# Patient Record
Sex: Female | Born: 1940 | Race: Black or African American | Hispanic: No | Marital: Married | Smoking: Former smoker
Health system: Southern US, Community
[De-identification: ages and names within clinical notes are randomized; demographics above are authoritative.]

## PROBLEM LIST (undated history)

## (undated) DIAGNOSIS — T783XXA Angioneurotic edema, initial encounter: Secondary | ICD-10-CM

## (undated) DIAGNOSIS — R410 Disorientation, unspecified: Secondary | ICD-10-CM

## (undated) DIAGNOSIS — F329 Major depressive disorder, single episode, unspecified: Secondary | ICD-10-CM

## (undated) DIAGNOSIS — N189 Chronic kidney disease, unspecified: Secondary | ICD-10-CM

## (undated) DIAGNOSIS — I517 Cardiomegaly: Secondary | ICD-10-CM

## (undated) DIAGNOSIS — M069 Rheumatoid arthritis, unspecified: Secondary | ICD-10-CM

## (undated) DIAGNOSIS — E559 Vitamin D deficiency, unspecified: Secondary | ICD-10-CM

## (undated) DIAGNOSIS — Z515 Encounter for palliative care: Secondary | ICD-10-CM

## (undated) DIAGNOSIS — E119 Type 2 diabetes mellitus without complications: Secondary | ICD-10-CM

## (undated) DIAGNOSIS — R569 Unspecified convulsions: Secondary | ICD-10-CM

## (undated) DIAGNOSIS — F32A Depression, unspecified: Secondary | ICD-10-CM

## (undated) DIAGNOSIS — M1990 Unspecified osteoarthritis, unspecified site: Secondary | ICD-10-CM

## (undated) DIAGNOSIS — F419 Anxiety disorder, unspecified: Secondary | ICD-10-CM

## (undated) DIAGNOSIS — K5909 Other constipation: Secondary | ICD-10-CM

## (undated) DIAGNOSIS — J449 Chronic obstructive pulmonary disease, unspecified: Secondary | ICD-10-CM

## (undated) DIAGNOSIS — I639 Cerebral infarction, unspecified: Secondary | ICD-10-CM

## (undated) DIAGNOSIS — K219 Gastro-esophageal reflux disease without esophagitis: Secondary | ICD-10-CM

## (undated) DIAGNOSIS — L281 Prurigo nodularis: Secondary | ICD-10-CM

## (undated) DIAGNOSIS — R4182 Altered mental status, unspecified: Secondary | ICD-10-CM

## (undated) DIAGNOSIS — I10 Essential (primary) hypertension: Secondary | ICD-10-CM

## (undated) DIAGNOSIS — K8020 Calculus of gallbladder without cholecystitis without obstruction: Secondary | ICD-10-CM

## (undated) DIAGNOSIS — Z720 Tobacco use: Secondary | ICD-10-CM

## (undated) DIAGNOSIS — I509 Heart failure, unspecified: Secondary | ICD-10-CM

## (undated) HISTORY — DX: Vitamin D deficiency, unspecified: E55.9

## (undated) HISTORY — DX: Unspecified convulsions: R56.9

## (undated) HISTORY — DX: Chronic kidney disease, unspecified: N18.9

## (undated) HISTORY — DX: Depression, unspecified: F32.A

## (undated) HISTORY — PX: CHOLECYSTECTOMY: SHX55

## (undated) HISTORY — DX: Gastro-esophageal reflux disease without esophagitis: K21.9

## (undated) HISTORY — DX: Major depressive disorder, single episode, unspecified: F32.9

## (undated) HISTORY — DX: Other constipation: K59.09

## (undated) HISTORY — DX: Cardiomegaly: I51.7

## (undated) HISTORY — DX: Calculus of gallbladder without cholecystitis without obstruction: K80.20

## (undated) HISTORY — DX: Anxiety disorder, unspecified: F41.9

## (undated) HISTORY — DX: Tobacco use: Z72.0

## (undated) HISTORY — DX: Angioneurotic edema, initial encounter: T78.3XXA

## (undated) HISTORY — DX: Cerebral infarction, unspecified: I63.9

## (undated) HISTORY — DX: Unspecified osteoarthritis, unspecified site: M19.90

## (undated) HISTORY — DX: Chronic obstructive pulmonary disease, unspecified: J44.9

## (undated) HISTORY — DX: Essential (primary) hypertension: I10

## (undated) HISTORY — DX: Prurigo nodularis: L28.1

---

## 2004-04-29 ENCOUNTER — Ambulatory Visit: Payer: Self-pay | Admitting: Family Medicine

## 2006-11-09 IMAGING — CT CT ABD-PELV W/ CM
1 of 2 series · 15 of 32 positions shown, 19 images · non-contrast
Comparison: none

REASON FOR EXAM: RUQ and RLQ , supra pubic pain
COMMENTS:

[Series 2: abdomen · axial · 0.70mm/px · z∈[-458,-90]mm · 15 of 51 slices shown, 19 images]
[im 3/51  soft-tissue]
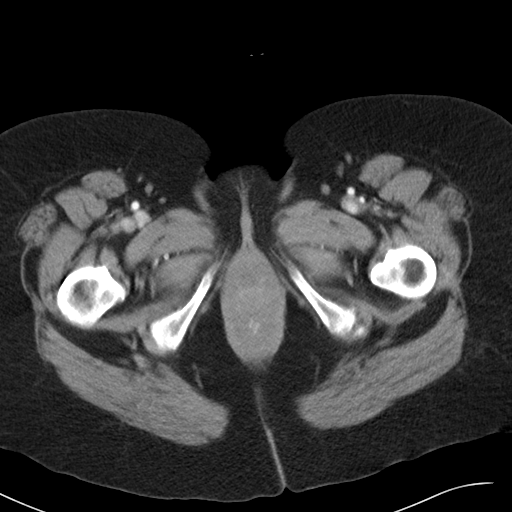
[im 3/51  bone]
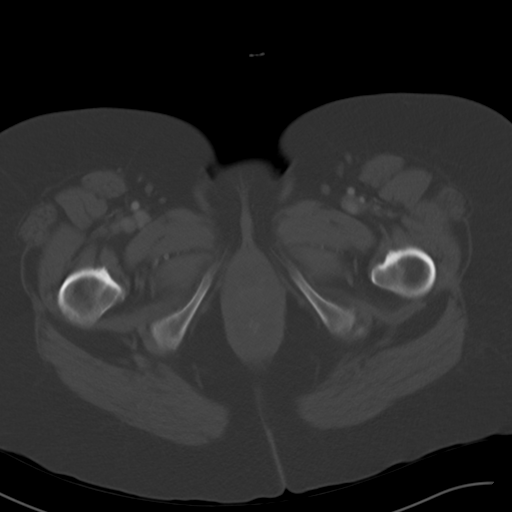
[im 7/51  soft-tissue]
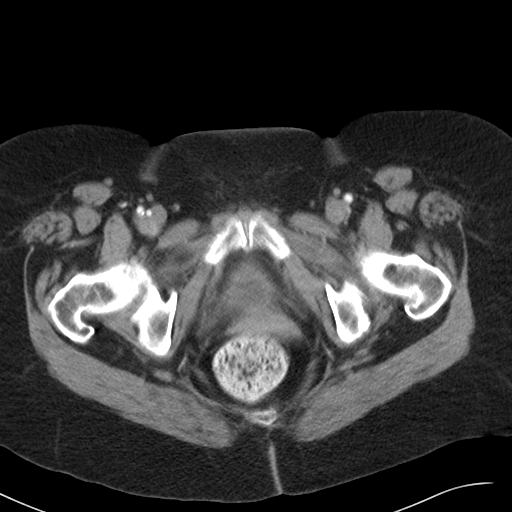
[im 11/51  soft-tissue]
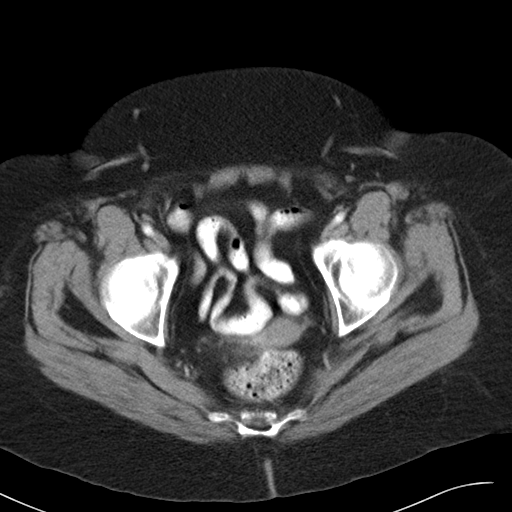
[im 15/51  soft-tissue]
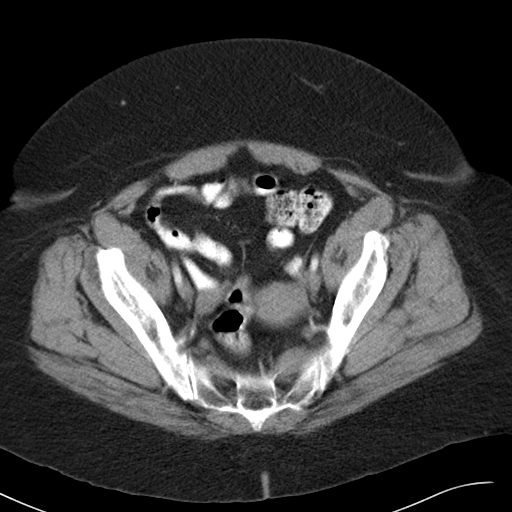
[im 19/51  soft-tissue]
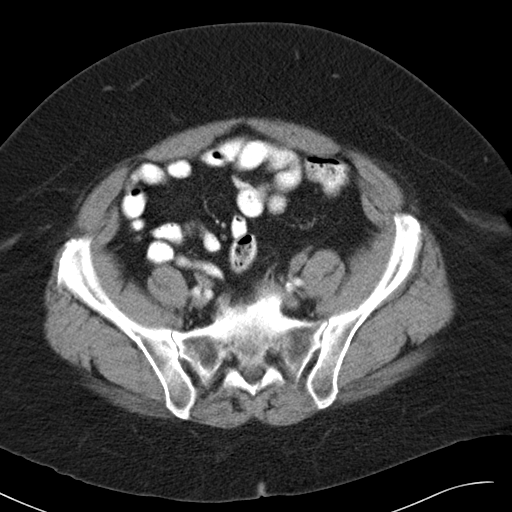
[im 23/51  soft-tissue]
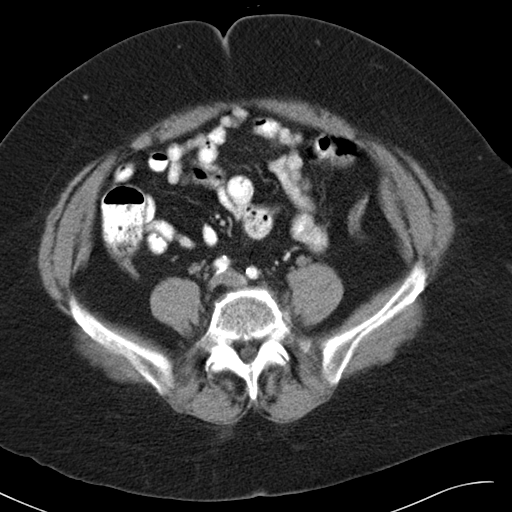
[im 27/51  soft-tissue]
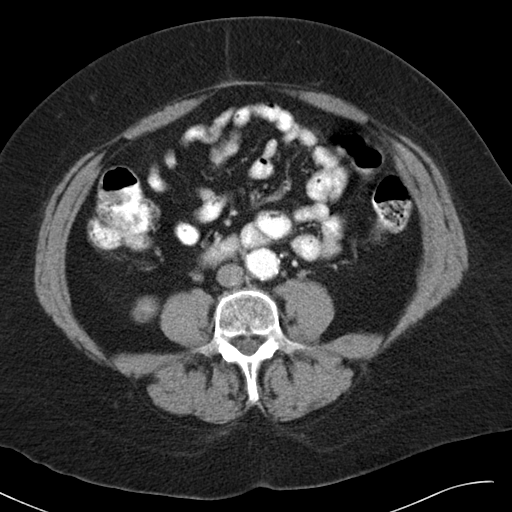
[im 29/51  soft-tissue]
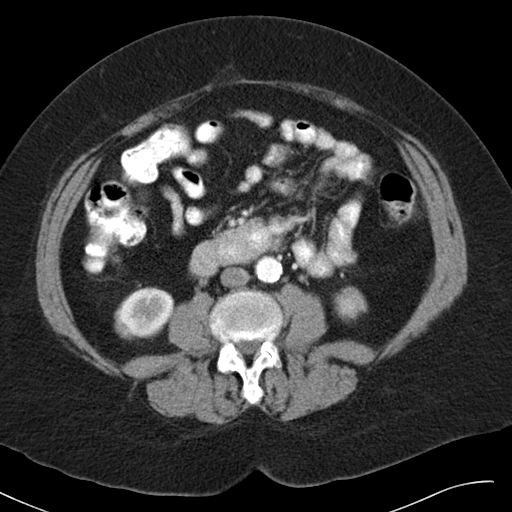
[im 33/51  soft-tissue]
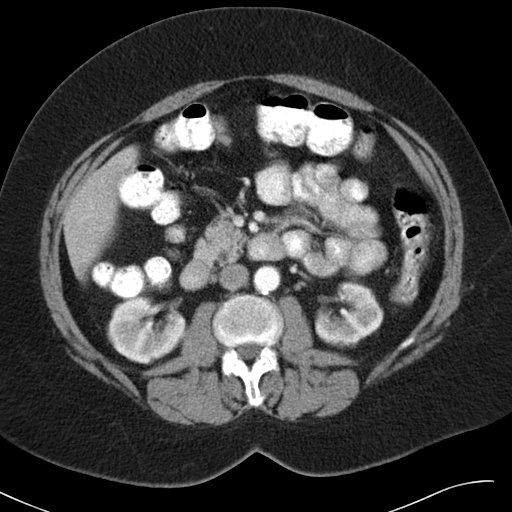
[im 33/51  bone]
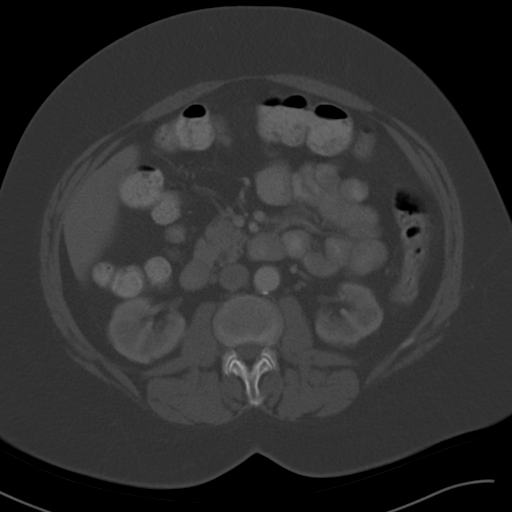
[im 37/51  soft-tissue]
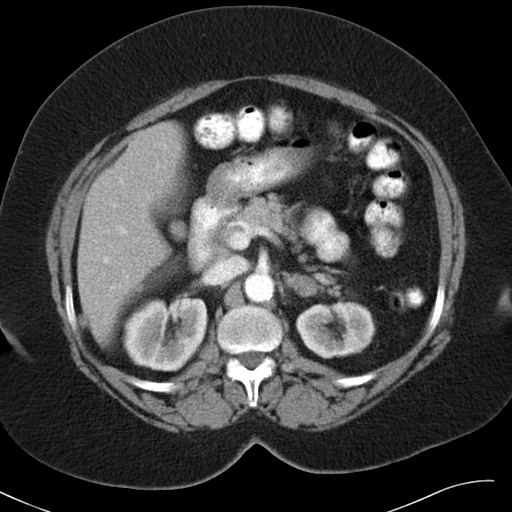
[im 41/51  soft-tissue]
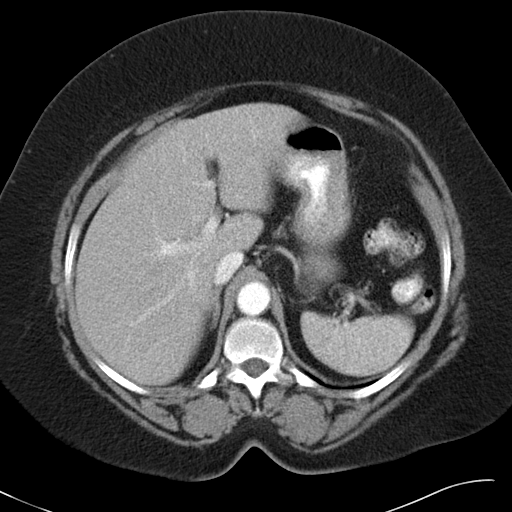
[im 43/51  lung]
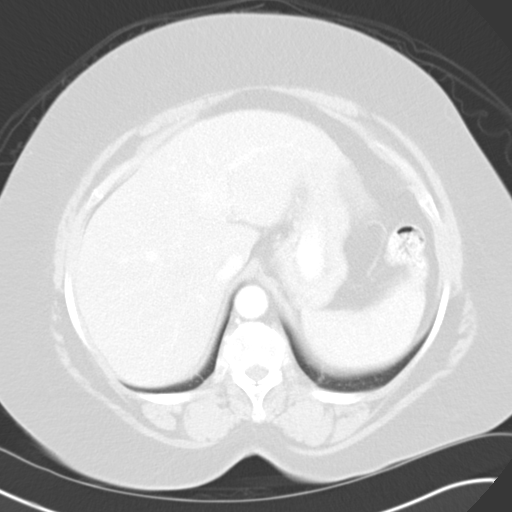
[im 45/51  soft-tissue]
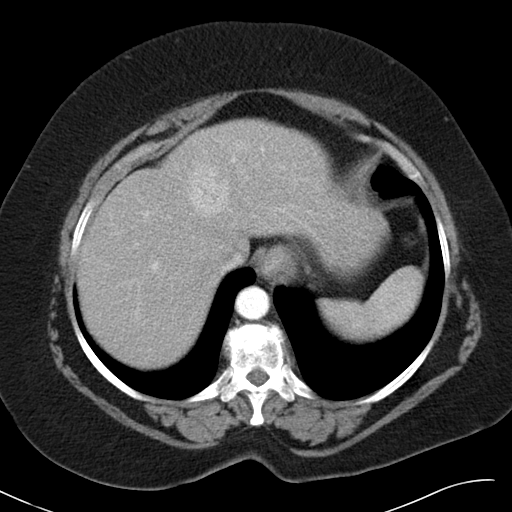
[im 45/51  lung]
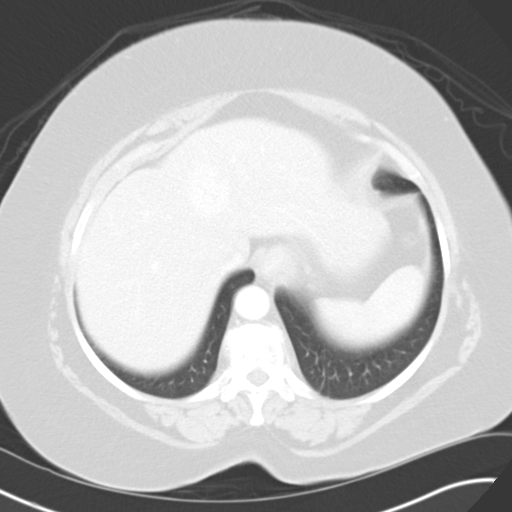
[im 47/51  lung]
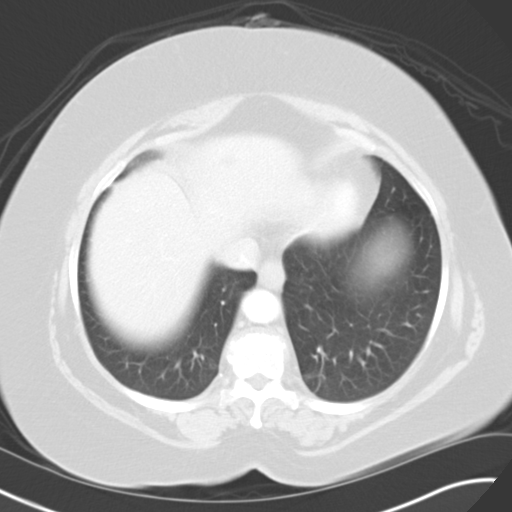
[im 49/51  soft-tissue]
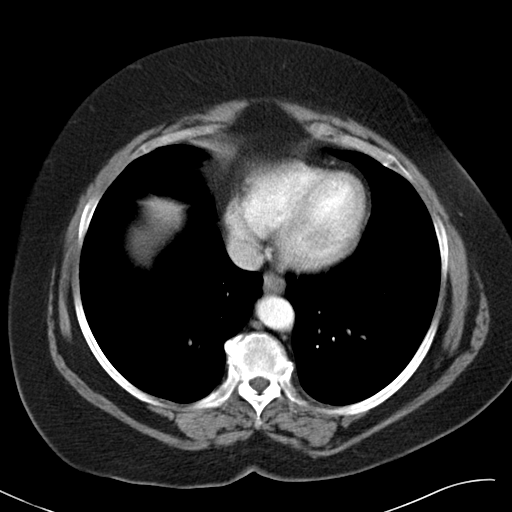
[im 49/51  lung]
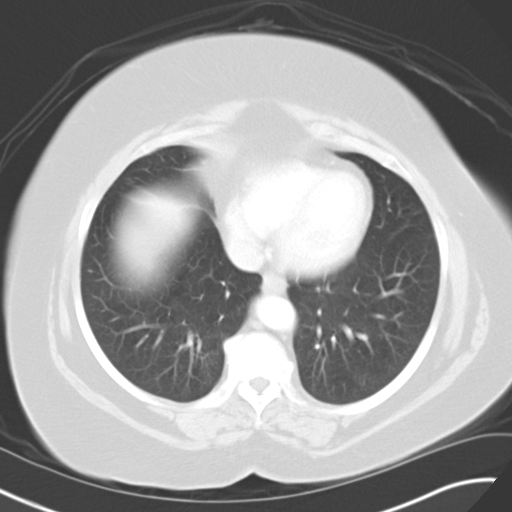

[15 of 32 positions shown; findings below may reference images not displayed]

PROCEDURE:     CT  - CT ABDOMEN / PELVIS  W  - April 29, 2004  [DATE]

RESULT:     8 mm helical cuts were performed through the abdomen and pelvis
with oral and 100 ccs of Isovue 370 contrast.  The lung bases are clear. The
liver, spleen, stomach, pancreas, adrenal glands and kidneys are
unremarkable. The gallbladder has been previously removed.  There is no free
intraperitoneal fluid, air or adenopathy. The bony structures show
degenerative change. Cuts through the pelvis show the bladder to distend
normally without evidence of filling defect or wall thickening. The patient
still has her uterus.  CT is not reliable for evaluating the endometrium. If
there is clinical concern ultrasound would be of benefit. There is no
diverticular disease, evidence of abscess or diverticulitis. No inguinal
mass or adenopathy is noted.
IMPRESSION: 1)No suspicious solid organ abnormalities, free intraperitoneal fluid, air
or adenopathy.

2)Extensive degenerative bony changes without lytic or blastic bony lesion.

3)Vascular calcifications without evidence of aneurysmal dilatation of the
aorta.

## 2007-12-24 ENCOUNTER — Ambulatory Visit: Payer: Self-pay | Admitting: Nephrology

## 2008-07-24 ENCOUNTER — Emergency Department: Payer: Self-pay | Admitting: Emergency Medicine

## 2010-04-30 ENCOUNTER — Emergency Department: Payer: Self-pay | Admitting: Unknown Physician Specialty

## 2010-07-05 IMAGING — US US RENAL KIDNEY
1 series · 17 of 20 positions shown · non-contrast
Comparison: none

REASON FOR EXAM: Chronic kidney disease - stage III
COMMENTS:

[Series 1: us renal kidney · 17 of 20 slices shown]
[im 1/20]
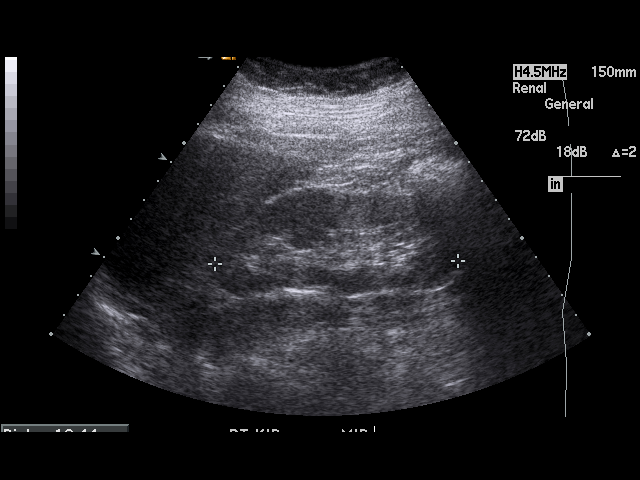
[im 2/20]
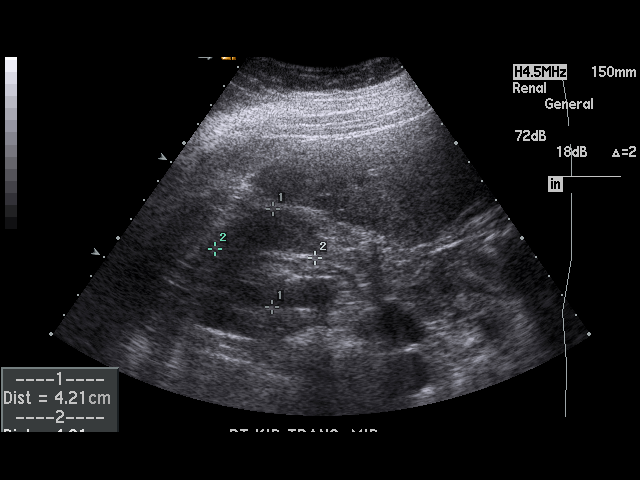
[im 3/20]
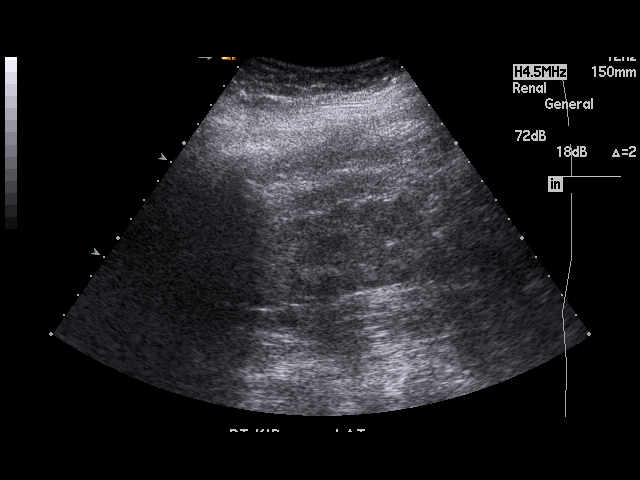
[im 5/20]
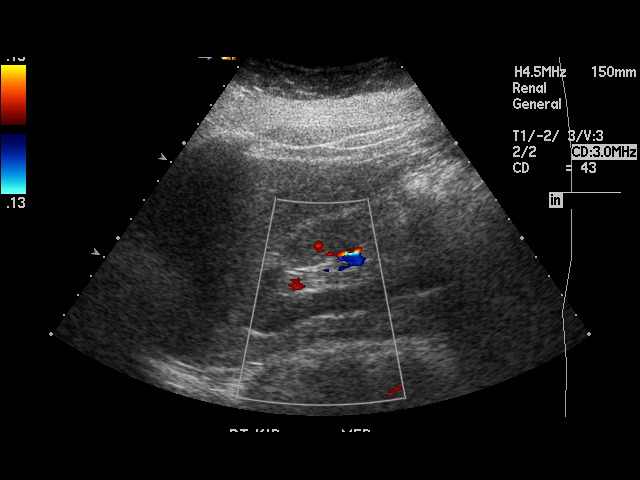
[im 6/20]
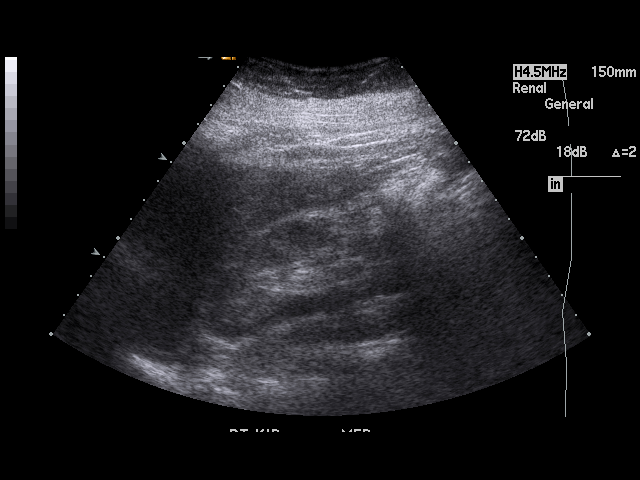
[im 7/20]
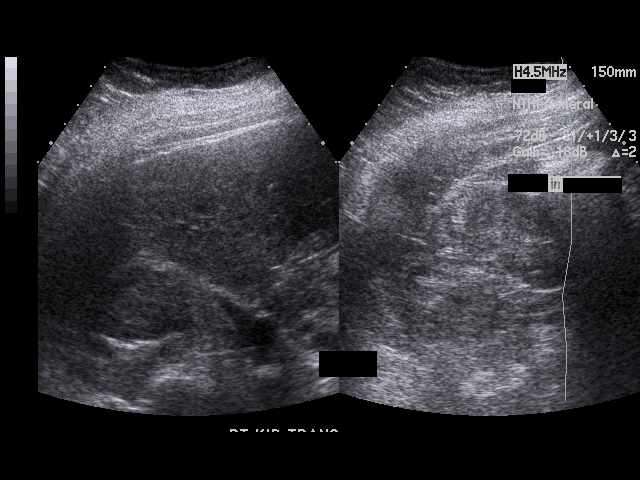
[im 8/20]
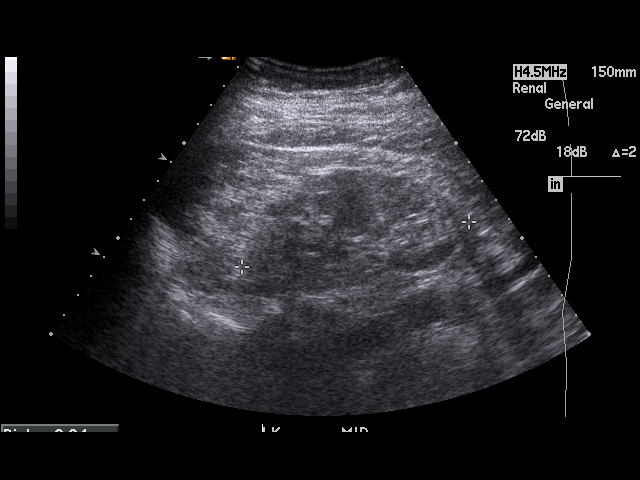
[im 9/20]
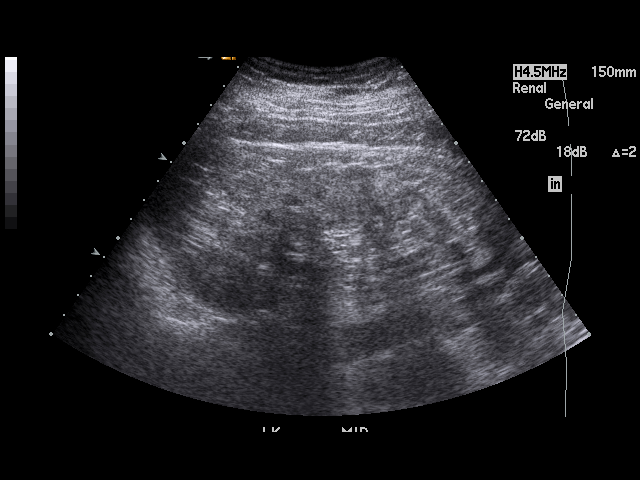
[im 11/20]
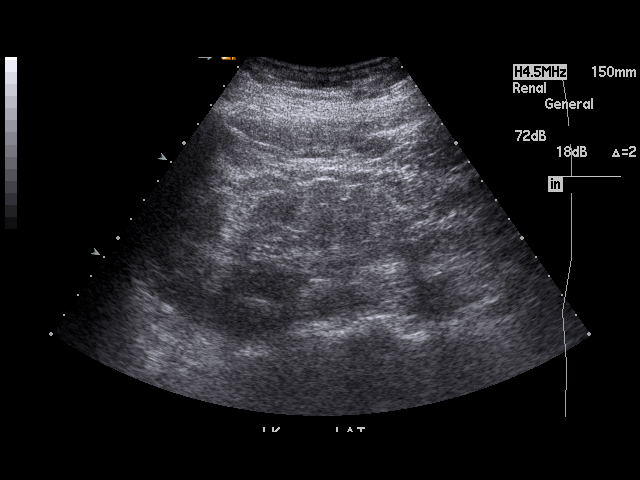
[im 12/20]
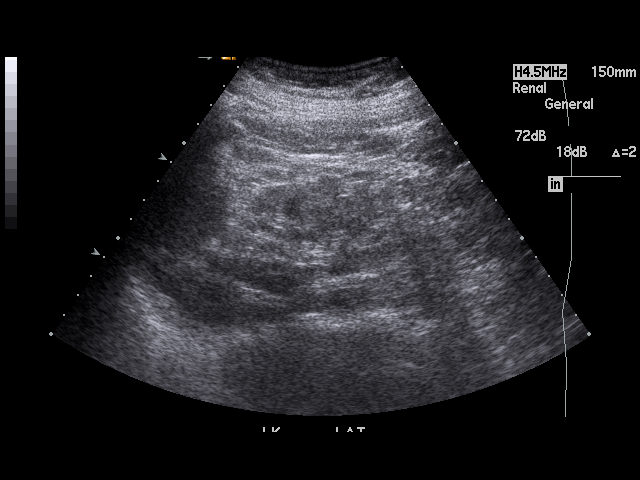
[im 13/20]
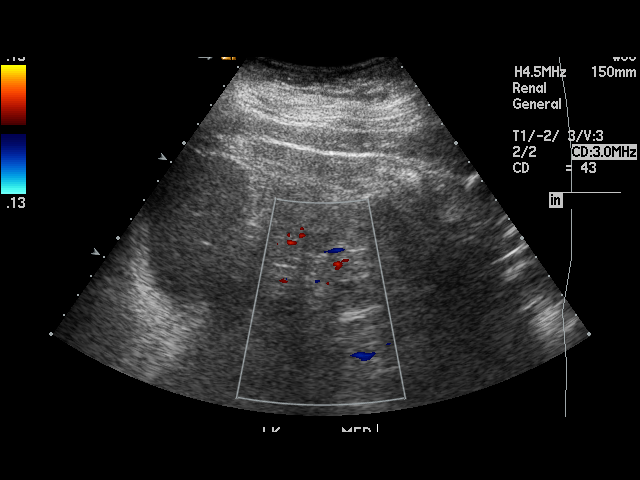
[im 14/20]
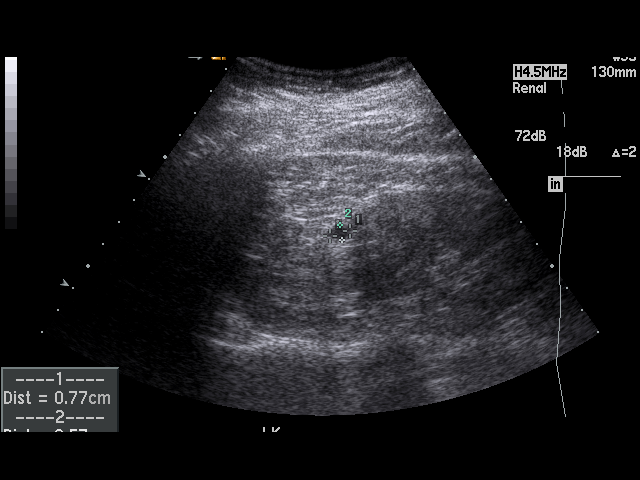
[im 15/20]
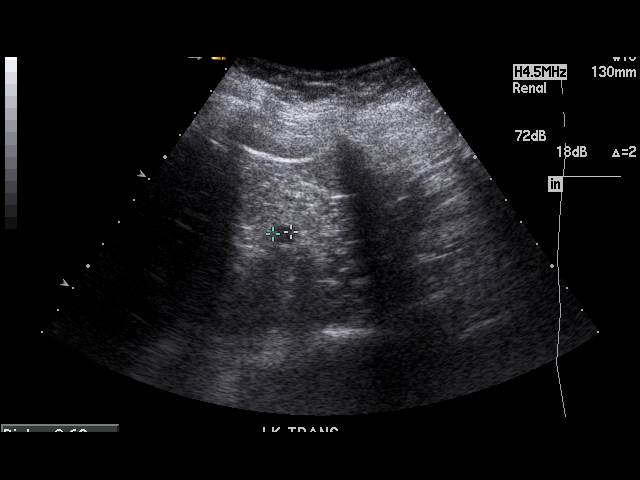
[im 16/20]
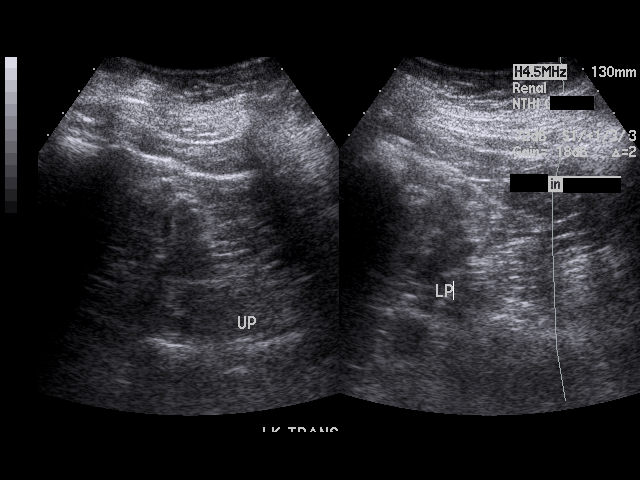
[im 18/20]
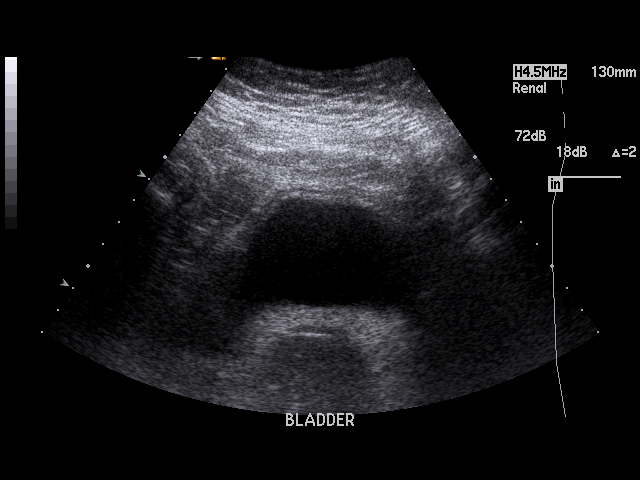
[im 19/20]
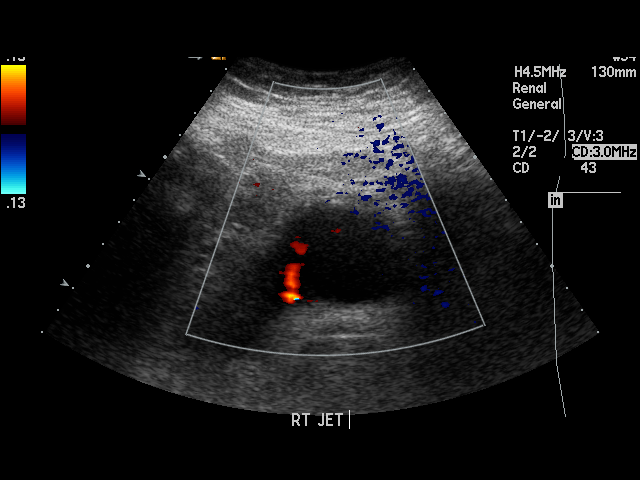
[im 20/20]
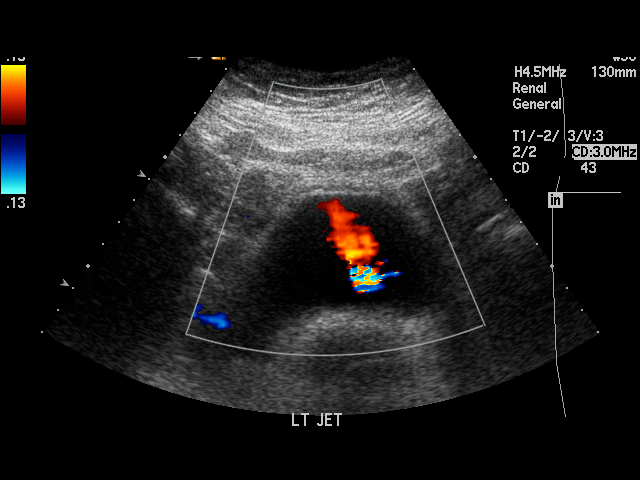

[17 of 20 positions shown; findings below may reference images not displayed]

PROCEDURE:     US  - US KIDNEY  - December 24, 2007  [DATE]

RESULT:     The RIGHT kidney measures 10.44 cm x 4.21 cm x 4.31 cm. The LEFT
kidney measures 9.94 cm x 4.27 cm x 3.84 cm. The renal cortical margins are
smooth. The kidneys show increased echogenicity compatible with the clinical
history of renal insufficiency. Incidentally noted is a 7.7 mm cyst of the
LEFT kidney. No solid renal mass lesions are seen. No renal calcifications
are noted. There is no hydronephrosis. The visualized portion of the urinary
bladder is normal in appearance. Ureteral flow jets are observed bilaterally.
IMPRESSION: 1.  No acute changes are identified.
2.  No hydronephrosis, renal calcifications or solid renal mass lesions are
identified.
3.  Incidental note is made of a tiny, LEFT renal cyst.
4.  The kidneys show increased echogenicity consistent with the clinical
history of renal insufficiency.

## 2011-02-04 IMAGING — CT CT HEAD WITHOUT CONTRAST
2 series · 16 of 30 positions shown, 20 images · non-contrast
Comparison: none

REASON FOR EXAM: DIZZY, VOMITING
COMMENTS:

[Series 2: without · axial · non-contrast · 0.40mm/px · z∈[+308,+422]mm · 13 of 27 slices shown, 17 images]
[im 2/27  brain]
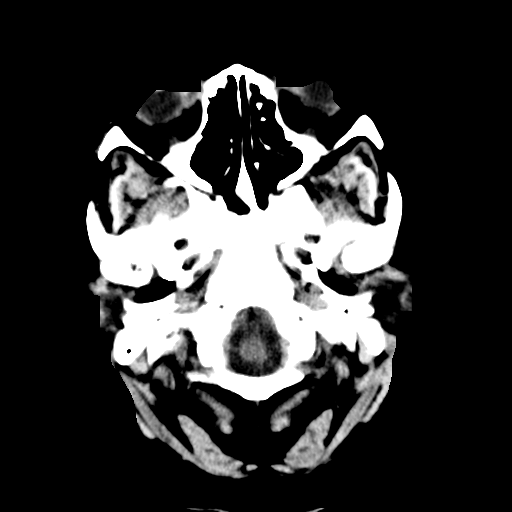
[im 2/27  bone]
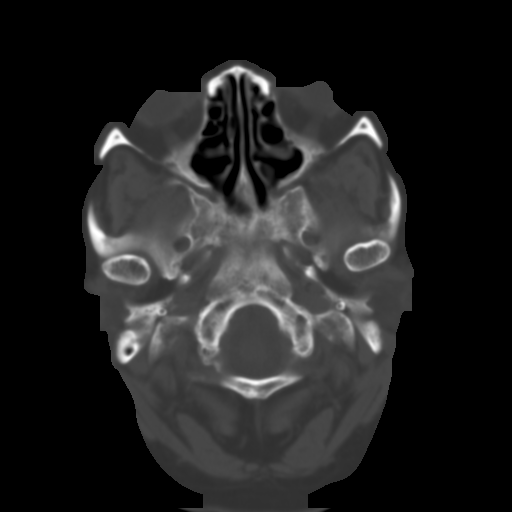
[im 4/27  brain]
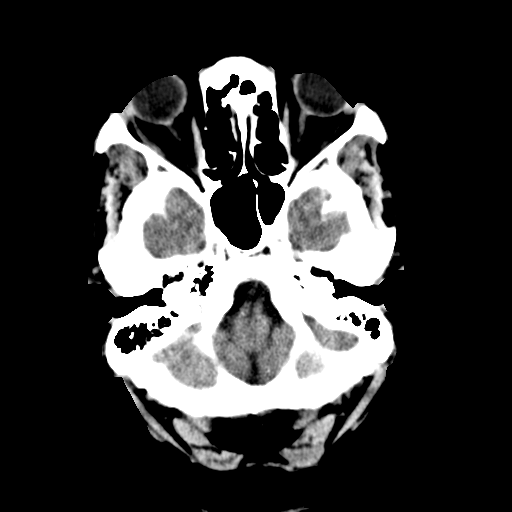
[im 6/27  brain]
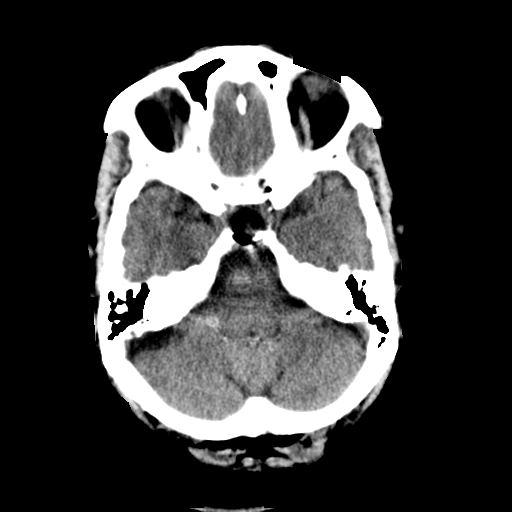
[im 8/27  brain]
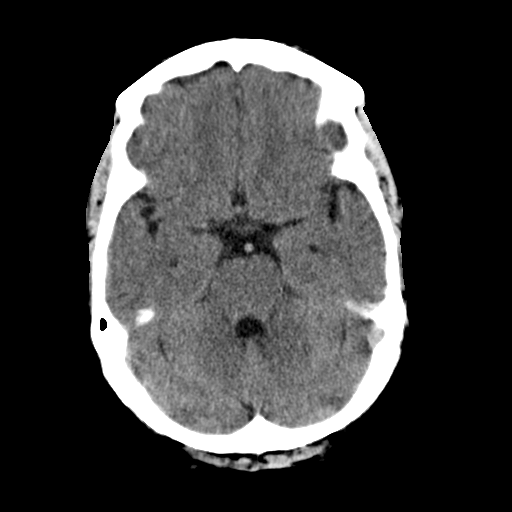
[im 10/27  brain]
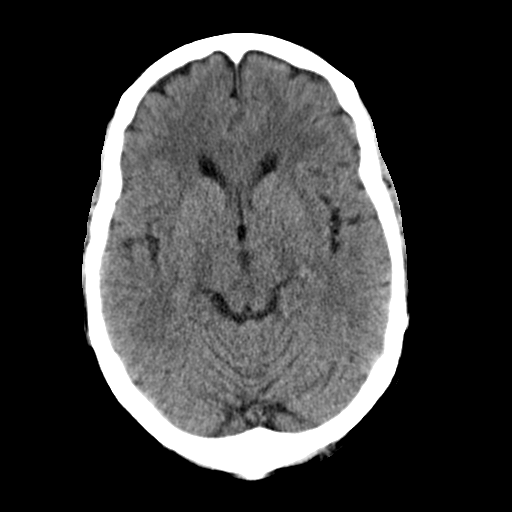
[im 10/27  bone]
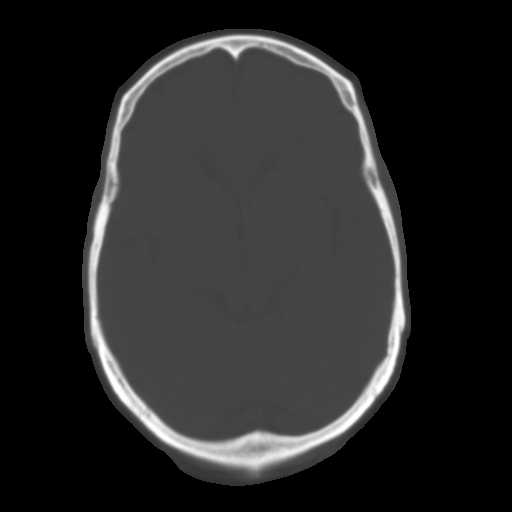
[im 12/27  brain]
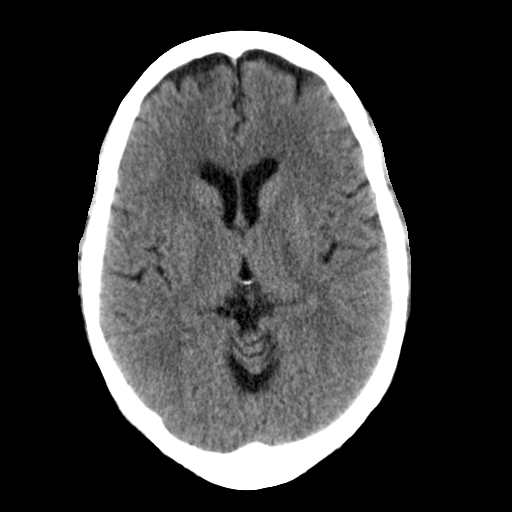
[im 14/27  brain]
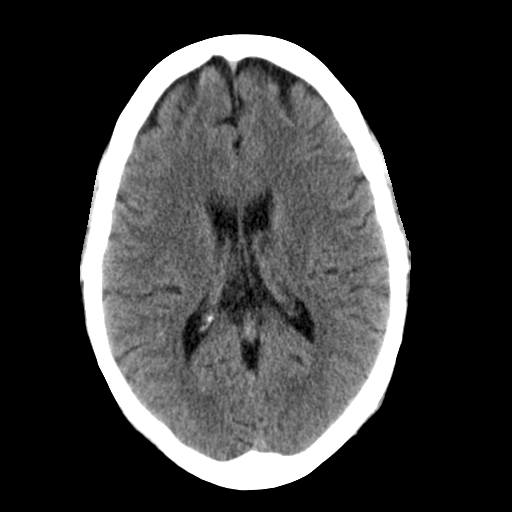
[im 15/27  brain]
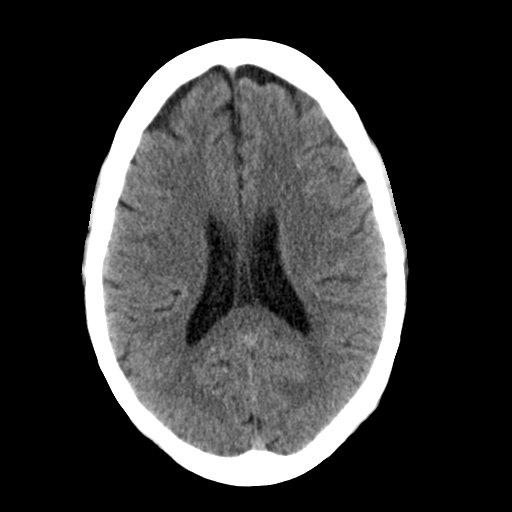
[im 17/27  brain]
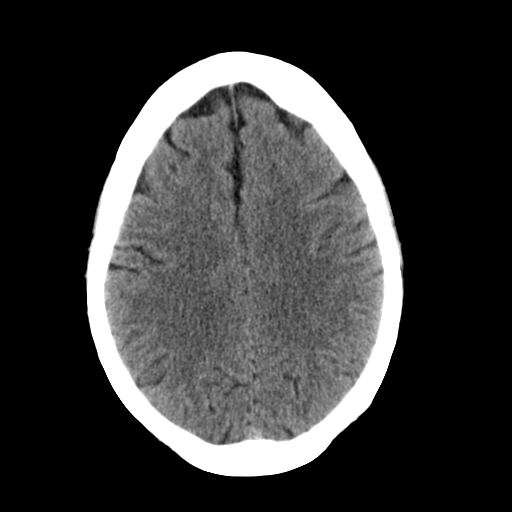
[im 17/27  bone]
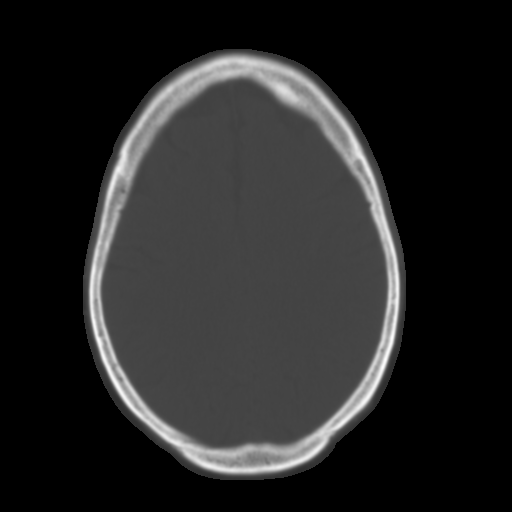
[im 19/27  brain]
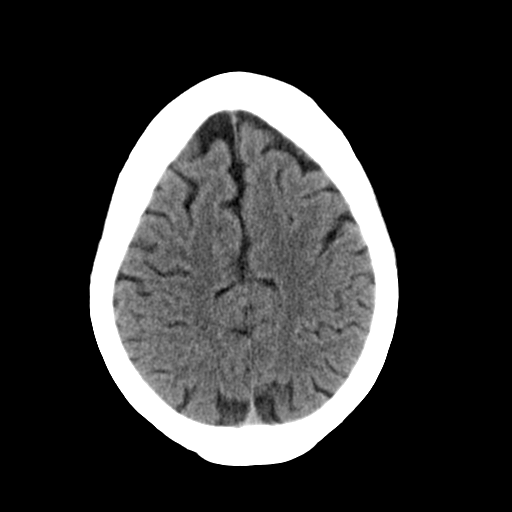
[im 21/27  brain]
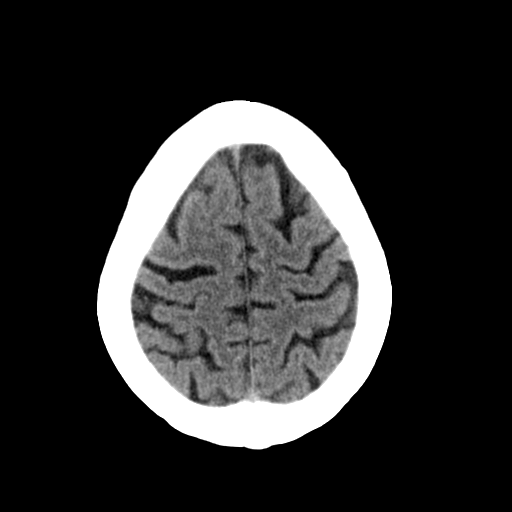
[im 23/27  brain]
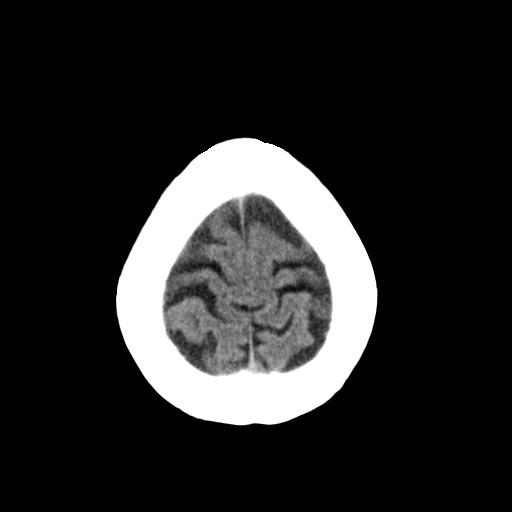
[im 25/27  brain]
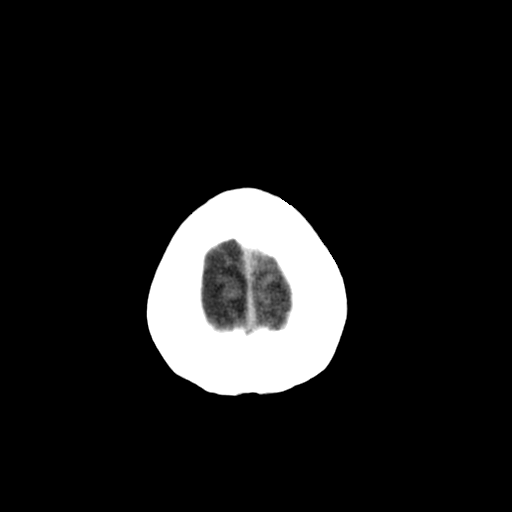
[im 25/27  bone]
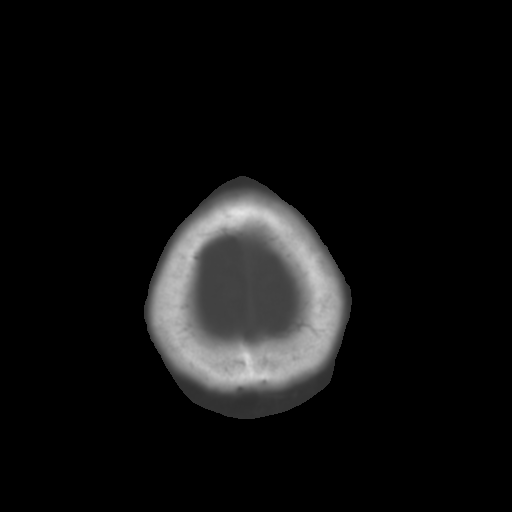

[Series 3: bone · axial · 0.40mm/px · z∈[+308,+348]mm · 3 of 27 slices shown]
[im 2/27  bone]
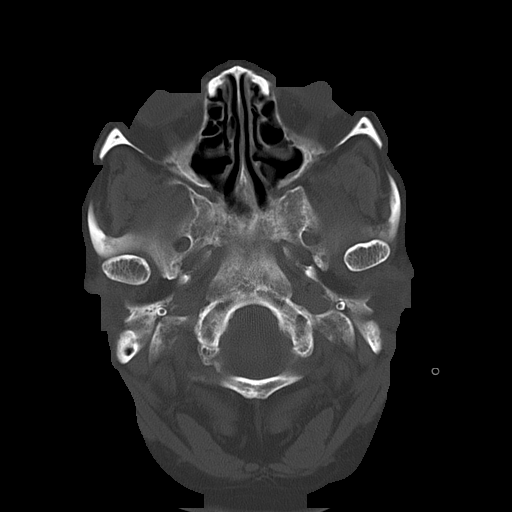
[im 6/27  bone]
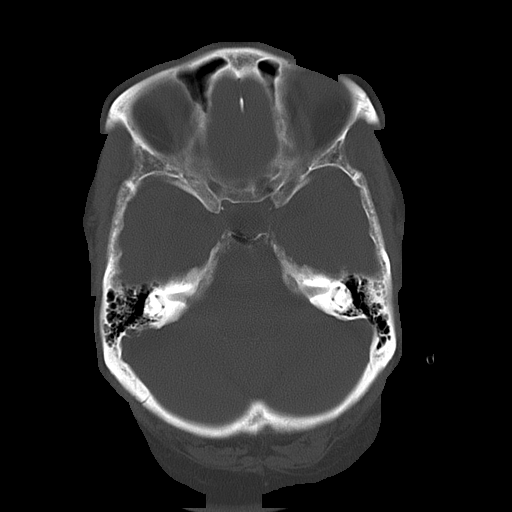
[im 10/27  bone]
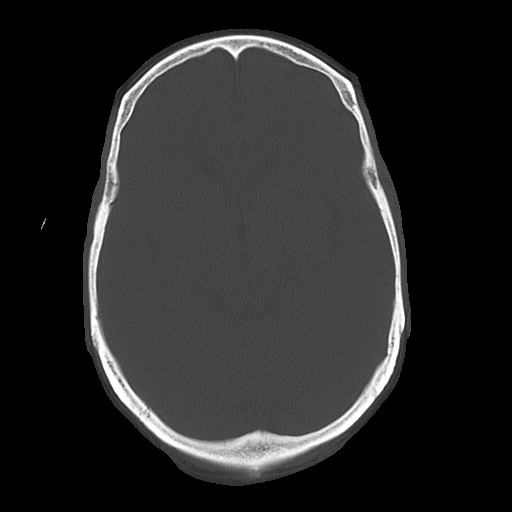

[16 of 30 positions shown; findings below may reference images not displayed]

PROCEDURE:     CT  - CT HEAD WITHOUT CONTRAST  - July 25, 2008 [DATE]

RESULT:     Axial noncontrast CT scanning was performed through the brain at
5 mm intervals and slice thicknesses.

The ventricles are normal in size and position. There is no evidence of an
intracranial hemorrhage nor of intracranial mass effect. There are no
findings to suggest an evolving ischemic infarction. The cerebellum and
brainstem are normal in appearance. At bone window settings the observed
portions of the paranasal sinuses and mastoid air cells are clear. There is
no evidence of an acute skull fracture.
IMPRESSION: I see no acute intracranial abnormality.

A preliminary report was sent to the [HOSPITAL] the conclusion
of the study.

## 2011-04-01 ENCOUNTER — Emergency Department: Payer: Self-pay | Admitting: Emergency Medicine

## 2011-04-01 LAB — COMPREHENSIVE METABOLIC PANEL
Anion Gap: 11 (ref 7–16)
BUN: 24 mg/dL — ABNORMAL HIGH (ref 7–18)
Bilirubin,Total: 0.3 mg/dL (ref 0.2–1.0)
Calcium, Total: 10 mg/dL (ref 8.5–10.1)
Chloride: 107 mmol/L (ref 98–107)
Creatinine: 1.53 mg/dL — ABNORMAL HIGH (ref 0.60–1.30)
EGFR (African American): 43 — ABNORMAL LOW
Potassium: 4.4 mmol/L (ref 3.5–5.1)
SGOT(AST): 22 U/L (ref 15–37)
Total Protein: 8.2 g/dL (ref 6.4–8.2)

## 2011-04-01 LAB — CBC
HCT: 43.7 % (ref 35.0–47.0)
MCH: 32.3 pg (ref 26.0–34.0)
MCHC: 33.7 g/dL (ref 32.0–36.0)
MCV: 96 fL (ref 80–100)
Platelet: 235 10*3/uL (ref 150–440)
RDW: 14.6 % — ABNORMAL HIGH (ref 11.5–14.5)
WBC: 5 10*3/uL (ref 3.6–11.0)

## 2011-04-01 LAB — URINALYSIS, COMPLETE
Glucose,UR: NEGATIVE mg/dL (ref 0–75)
Ketone: NEGATIVE
Ph: 5 (ref 4.5–8.0)
Protein: NEGATIVE
RBC,UR: 1 /HPF (ref 0–5)
Specific Gravity: 1.009 (ref 1.003–1.030)
Squamous Epithelial: 3
WBC UR: 3 /HPF (ref 0–5)

## 2011-04-01 LAB — LIPASE, BLOOD: Lipase: 187 U/L (ref 73–393)

## 2011-11-04 ENCOUNTER — Emergency Department: Payer: Self-pay | Admitting: Unknown Physician Specialty

## 2011-11-04 LAB — CBC
Platelet: 198 10*3/uL (ref 150–440)
RBC: 4.51 10*6/uL (ref 3.80–5.20)
RDW: 13.9 % (ref 11.5–14.5)

## 2011-11-04 LAB — HEPATIC FUNCTION PANEL A (ARMC)
Albumin: 3.6 g/dL (ref 3.4–5.0)
SGOT(AST): 19 U/L (ref 15–37)
SGPT (ALT): 13 U/L (ref 12–78)
Total Protein: 7.5 g/dL (ref 6.4–8.2)

## 2011-11-04 LAB — BASIC METABOLIC PANEL
Anion Gap: 10 (ref 7–16)
Co2: 26 mmol/L (ref 21–32)
Creatinine: 1.31 mg/dL — ABNORMAL HIGH (ref 0.60–1.30)
Glucose: 100 mg/dL — ABNORMAL HIGH (ref 65–99)
Sodium: 144 mmol/L (ref 136–145)

## 2011-11-04 LAB — PRO B NATRIURETIC PEPTIDE: B-Type Natriuretic Peptide: 142 pg/mL — ABNORMAL HIGH (ref 0–125)

## 2011-11-14 HISTORY — PX: POLYPECTOMY: SHX149

## 2011-11-22 ENCOUNTER — Ambulatory Visit: Payer: Self-pay | Admitting: Family Medicine

## 2011-12-06 ENCOUNTER — Ambulatory Visit: Payer: Self-pay | Admitting: Otolaryngology

## 2012-11-09 IMAGING — CR DG CHEST 1V
1 series · 1 of 1 positions shown · non-contrast
Comparison: none

REASON FOR EXAM: cp
COMMENTS:

[view not recorded]
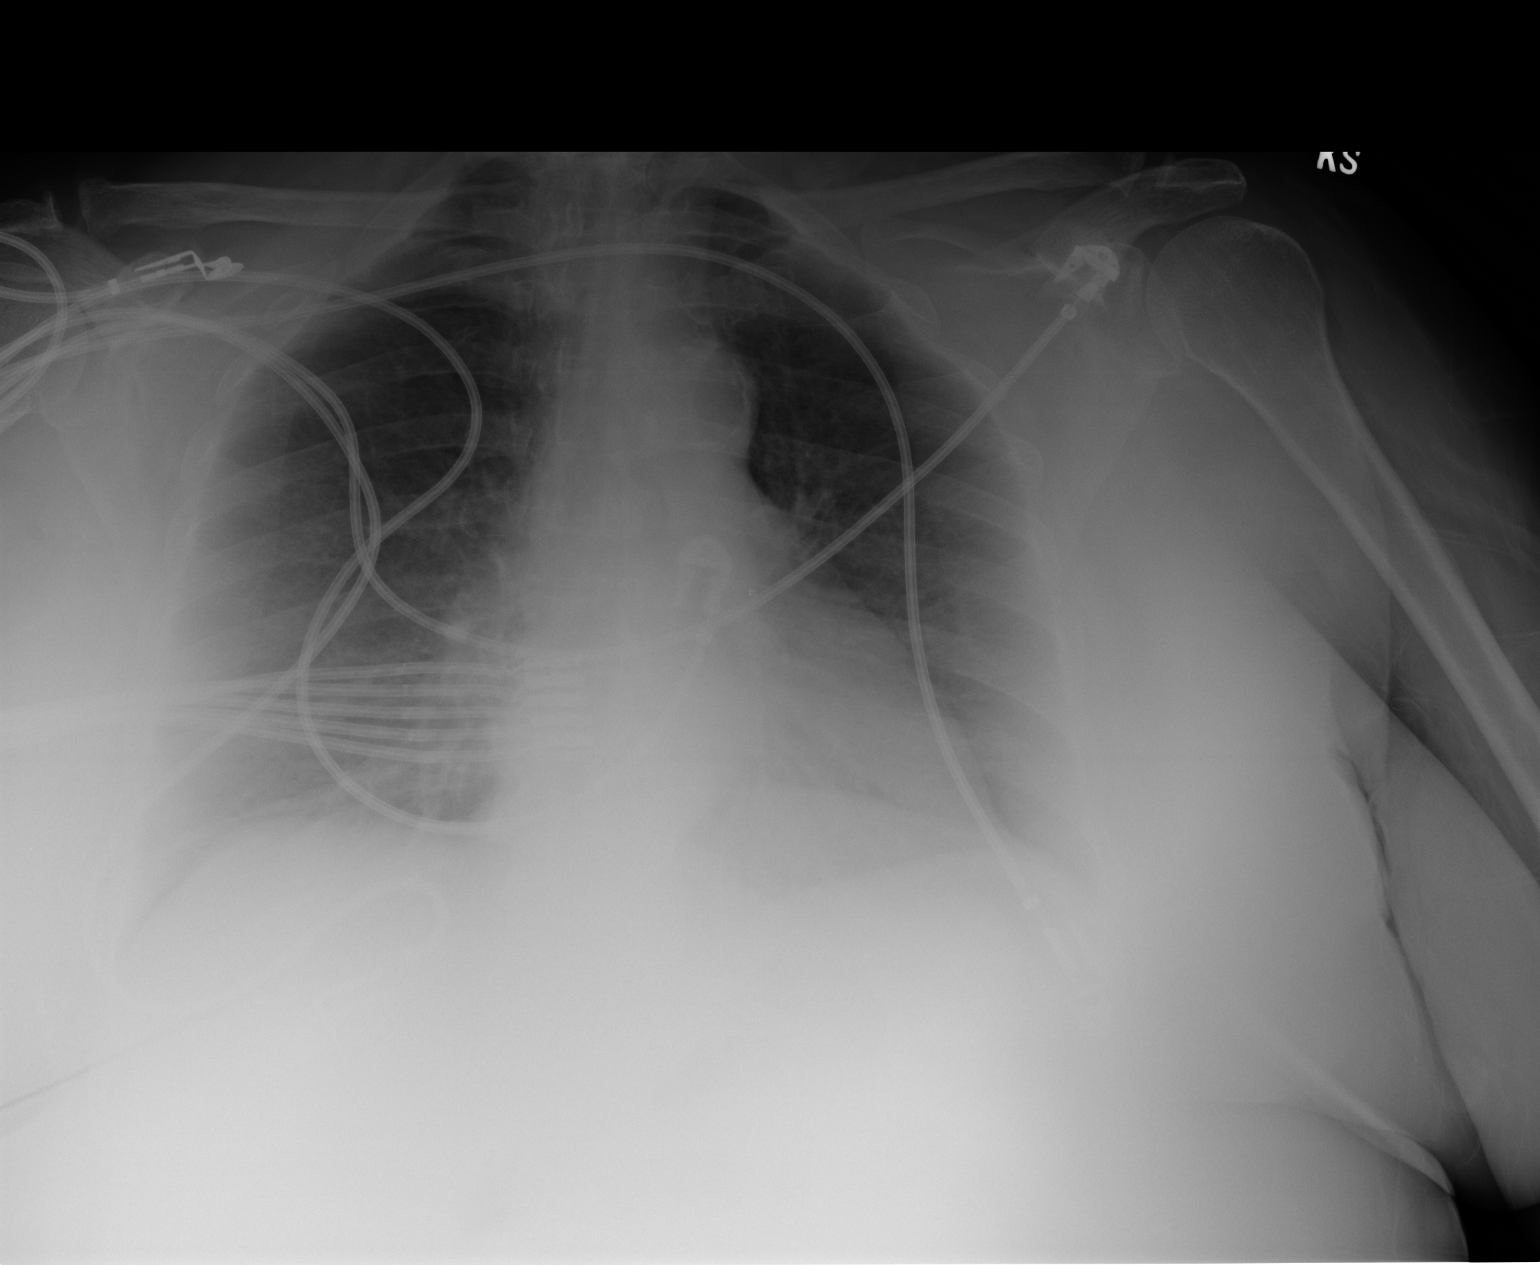

[1 of 1 positions shown; findings below may reference images not displayed]

PROCEDURE:     DXR - DXR CHEST 1 VIEWAP OR PA  - April 30, 2010  [DATE]

RESULT:     Cardiac monitoring electrodes are present. The trachea appears
to be displaced toward the left in the neck. Thyroid enlargement may be
present. The heart is borderline enlarged. There is shallow inspiration
without edema, infiltrate, effusion or pneumothorax. Atherosclerotic
calcification is present in the aortic arch.
IMPRESSION: 1. Shallow inspiration.
2. No acute cardiopulmonary disease.
2. Displacement of the trachea toward the left. Correlate for thyromegaly.

## 2013-01-22 ENCOUNTER — Observation Stay: Payer: Self-pay | Admitting: Internal Medicine

## 2013-01-22 LAB — COMPREHENSIVE METABOLIC PANEL
Albumin: 3.5 g/dL (ref 3.4–5.0)
Alkaline Phosphatase: 92 U/L
Anion Gap: 7 (ref 7–16)
BUN: 18 mg/dL (ref 7–18)
Calcium, Total: 9.3 mg/dL (ref 8.5–10.1)
Co2: 23 mmol/L (ref 21–32)
EGFR (African American): 40 — ABNORMAL LOW
Glucose: 119 mg/dL — ABNORMAL HIGH (ref 65–99)
Potassium: 4.1 mmol/L (ref 3.5–5.1)
SGPT (ALT): 15 U/L (ref 12–78)
Sodium: 137 mmol/L (ref 136–145)
Total Protein: 7.5 g/dL (ref 6.4–8.2)

## 2013-01-22 LAB — URINALYSIS, COMPLETE
Bilirubin,UR: NEGATIVE
Blood: NEGATIVE
Glucose,UR: NEGATIVE mg/dL (ref 0–75)
Ketone: NEGATIVE
Leukocyte Esterase: NEGATIVE
Nitrite: NEGATIVE
Ph: 6 (ref 4.5–8.0)
Protein: NEGATIVE
RBC,UR: <1 /HPF (ref 0–5)
Specific Gravity: 1.005 (ref 1.003–1.030)
Squamous Epithelial: 2

## 2013-01-22 LAB — CBC
HCT: 44.4 % (ref 35.0–47.0)
HGB: 15 g/dL (ref 12.0–16.0)
MCH: 31.9 pg (ref 26.0–34.0)
RBC: 4.71 10*6/uL (ref 3.80–5.20)

## 2013-01-22 LAB — CK TOTAL AND CKMB (NOT AT ARMC): CK-MB: 0.5 ng/mL (ref 0.5–3.6)

## 2013-01-23 LAB — BASIC METABOLIC PANEL
Anion Gap: 9 (ref 7–16)
BUN: 19 mg/dL — ABNORMAL HIGH (ref 7–18)
Chloride: 108 mmol/L — ABNORMAL HIGH (ref 98–107)
Co2: 21 mmol/L (ref 21–32)
Creatinine: 1.41 mg/dL — ABNORMAL HIGH (ref 0.60–1.30)
EGFR (African American): 43 — ABNORMAL LOW
EGFR (Non-African Amer.): 37 — ABNORMAL LOW
Glucose: 137 mg/dL — ABNORMAL HIGH (ref 65–99)
Potassium: 4.2 mmol/L (ref 3.5–5.1)
Sodium: 138 mmol/L (ref 136–145)

## 2013-01-23 LAB — CBC WITH DIFFERENTIAL/PLATELET
Basophil #: 0 10*3/uL (ref 0.0–0.1)
Basophil %: 0.2 %
Eosinophil #: 0 10*3/uL (ref 0.0–0.7)
HGB: 13.4 g/dL (ref 12.0–16.0)
Lymphocyte #: 0.8 10*3/uL — ABNORMAL LOW (ref 1.0–3.6)
MCHC: 33.1 g/dL (ref 32.0–36.0)
Monocyte #: 0.1 x10 3/mm — ABNORMAL LOW (ref 0.2–0.9)
Platelet: 209 10*3/uL (ref 150–440)
RBC: 4.28 10*6/uL (ref 3.80–5.20)
RDW: 14.3 % (ref 11.5–14.5)
WBC: 7 10*3/uL (ref 3.6–11.0)

## 2013-01-23 LAB — MAGNESIUM: Magnesium: 1.7 mg/dL — ABNORMAL LOW

## 2013-02-20 ENCOUNTER — Emergency Department: Payer: Self-pay | Admitting: Emergency Medicine

## 2013-02-20 LAB — URINALYSIS, COMPLETE
Bacteria: NONE SEEN
Bilirubin,UR: NEGATIVE
Blood: NEGATIVE
Glucose,UR: NEGATIVE mg/dL (ref 0–75)
Hyaline Cast: 1
Ketone: NEGATIVE
Leukocyte Esterase: NEGATIVE
NITRITE: NEGATIVE
PH: 5 (ref 4.5–8.0)
PROTEIN: NEGATIVE
RBC,UR: 1 /HPF (ref 0–5)
Specific Gravity: 1.012 (ref 1.003–1.030)
WBC UR: 2 /HPF (ref 0–5)

## 2013-02-20 LAB — CBC
HCT: 41.7 % (ref 35.0–47.0)
HGB: 14.3 g/dL (ref 12.0–16.0)
MCH: 32.3 pg (ref 26.0–34.0)
MCHC: 34.3 g/dL (ref 32.0–36.0)
MCV: 94 fL (ref 80–100)
PLATELETS: 230 10*3/uL (ref 150–440)
RBC: 4.43 10*6/uL (ref 3.80–5.20)
RDW: 14.5 % (ref 11.5–14.5)
WBC: 4.3 10*3/uL (ref 3.6–11.0)

## 2013-02-20 LAB — BASIC METABOLIC PANEL
Anion Gap: 5 — ABNORMAL LOW (ref 7–16)
BUN: 17 mg/dL (ref 7–18)
CHLORIDE: 111 mmol/L — AB (ref 98–107)
CO2: 23 mmol/L (ref 21–32)
CREATININE: 1.58 mg/dL — AB (ref 0.60–1.30)
Calcium, Total: 9 mg/dL (ref 8.5–10.1)
EGFR (African American): 38 — ABNORMAL LOW
GFR CALC NON AF AMER: 33 — AB
Glucose: 115 mg/dL — ABNORMAL HIGH (ref 65–99)
Osmolality: 280 (ref 275–301)
Potassium: 4.1 mmol/L (ref 3.5–5.1)
Sodium: 139 mmol/L (ref 136–145)

## 2013-02-20 LAB — TROPONIN I

## 2013-02-22 LAB — URINE CULTURE

## 2013-05-06 ENCOUNTER — Other Ambulatory Visit (HOSPITAL_COMMUNITY): Payer: Self-pay | Admitting: *Deleted

## 2013-05-06 DIAGNOSIS — R9431 Abnormal electrocardiogram [ECG] [EKG]: Secondary | ICD-10-CM

## 2013-05-12 ENCOUNTER — Other Ambulatory Visit: Payer: Self-pay

## 2013-05-12 ENCOUNTER — Other Ambulatory Visit (INDEPENDENT_AMBULATORY_CARE_PROVIDER_SITE_OTHER): Payer: Commercial Managed Care - HMO

## 2013-05-12 DIAGNOSIS — R609 Edema, unspecified: Secondary | ICD-10-CM

## 2013-05-12 DIAGNOSIS — R9431 Abnormal electrocardiogram [ECG] [EKG]: Secondary | ICD-10-CM

## 2013-05-20 ENCOUNTER — Encounter: Payer: Self-pay | Admitting: Cardiovascular Disease

## 2013-05-20 ENCOUNTER — Ambulatory Visit (INDEPENDENT_AMBULATORY_CARE_PROVIDER_SITE_OTHER): Payer: Commercial Managed Care - HMO | Admitting: Cardiovascular Disease

## 2013-05-20 VITALS — BP 130/80 | HR 80 | Ht 60.0 in | Wt 232.5 lb

## 2013-05-20 DIAGNOSIS — I517 Cardiomegaly: Secondary | ICD-10-CM | POA: Insufficient documentation

## 2013-05-20 DIAGNOSIS — R0602 Shortness of breath: Secondary | ICD-10-CM | POA: Insufficient documentation

## 2013-05-20 DIAGNOSIS — E1159 Type 2 diabetes mellitus with other circulatory complications: Secondary | ICD-10-CM | POA: Insufficient documentation

## 2013-05-20 DIAGNOSIS — M79609 Pain in unspecified limb: Secondary | ICD-10-CM

## 2013-05-20 DIAGNOSIS — I10 Essential (primary) hypertension: Secondary | ICD-10-CM

## 2013-05-20 DIAGNOSIS — M79671 Pain in right foot: Secondary | ICD-10-CM | POA: Insufficient documentation

## 2013-05-20 HISTORY — DX: Pain in unspecified limb: M79.609

## 2013-05-20 NOTE — Assessment & Plan Note (Signed)
NSAIDs do not seem to be helping her discomfort. Recommended she talk with her primary care physician. X-ray may be needed. She does not feel her symptoms are secondary to gout.

## 2013-05-20 NOTE — Assessment & Plan Note (Signed)
Blood pressure initially very elevated in the setting of right foot pain. Improved on recheck by my account with systolic pressure [id number]. No medication changes made.

## 2013-05-20 NOTE — Patient Instructions (Signed)
You are doing well. No medication changes were made.  Please call us if you have new issues that need to be addressed before your next appt.    

## 2013-05-20 NOTE — Assessment & Plan Note (Signed)
Weight is a major issue. Talked about diet changes

## 2013-05-20 NOTE — Assessment & Plan Note (Signed)
The biggest concern noted on her echocardiogram is a moderate LVH. This is likely secondary to long-standing hypertension. Unable to exclude sleep apnea or obesity hypoventilation. Recommended she closely monitor her blood pressure.

## 2013-05-20 NOTE — Progress Notes (Signed)
Patient ID: Katherine [name], female    DOB: Jun 30, 1940, 73 y.o.   MRN: [id number]  HPI Comments: Katherine [name] is a 73 year old woman with hypertension, obesity, COPD, continued smoking, anxiety and depression who presents for followup after recent echocardiogram. She initially had EKG which was abnormal leading to echocardiogram.  Echocardiogram showed moderate LVH, normal ejection fraction, mildly dilated left atrium otherwise normal study. In general she reports having shortness of breath with exertion. She's not do any regular exercise. Weight continues to be a major problem. Her biggest complaint today is her right foot pain. She has difficulty putting on a shoe and difficult he with weightbearing. She has been trying NSAIDs with mild relief with recurrence of her pain with ambulation. She reports having swelling of the right foot.  Initial blood pressure was elevated on arrival 170/96. She attributes this to her for pain. Blood pressure did improve down to [id number] systolic and followup. She denies any chest pain with exertion. Overall she feels that she is doing well for 73 years old.  EKG today shows normal sinus rhythm with rate 90 beats a minute, no significant ST or T wave changes   Outpatient Encounter Prescriptions as of 05/20/2013  Medication Sig  . aspirin 81 MG tablet Take 81 mg by mouth daily.  . cetirizine (ZYRTEC) 10 MG tablet Take 10 mg by mouth daily.  [name] FLUoxetine (PROZAC) 10 MG tablet Take 10 mg by mouth daily.  . hydrALAZINE (APRESOLINE) 25 MG tablet Take 25 mg by mouth 2 (two) times daily.  [name] omeprazole (PRILOSEC) 40 MG capsule Take 40 mg by mouth daily.  . Vitamin D, Ergocalciferol, (DRISDOL) 50000 UNITS CAPS capsule Take 50,000 Units by mouth every 7 (seven) days.     Review of Systems  Constitutional: Negative.   HENT: Negative.   Eyes: Negative.   Respiratory: Positive for shortness of breath.   Cardiovascular: Negative.   Gastrointestinal: Negative.   Endocrine:  Negative.   Musculoskeletal: Positive for arthralgias.  Skin: Negative.   Allergic/Immunologic: Negative.   Neurological: Negative.   Hematological: Negative.   Psychiatric/Behavioral: Negative.   All other systems reviewed and are negative.    BP 130/80  Pulse 80  Ht 5' (1.524 m)  Wt 232 lb 8 oz (105.461 kg)  BMI 45.41 kg/m2  Physical Exam  Nursing note and vitals reviewed. Constitutional: She is oriented to person, place, and time. She appears well-developed and well-nourished.  Morbidly obese  HENT:  Head: Normocephalic.  Nose: Nose normal.  Mouth/Throat: Oropharynx is clear and moist.  Eyes: Conjunctivae are normal. Pupils are equal, round, and reactive to light.  Neck: Normal range of motion. Neck supple. No JVD present.  Cardiovascular: Normal rate, regular rhythm, S1 normal, S2 normal, normal heart sounds and intact distal pulses.  Exam reveals no gallop and no friction rub.   No murmur heard. Pulmonary/Chest: Effort normal and breath sounds normal. No respiratory distress. She has no wheezes. She has no rales. She exhibits no tenderness.  Abdominal: Soft. Bowel sounds are normal. She exhibits no distension. There is no tenderness.  Musculoskeletal: Normal range of motion. She exhibits no edema and no tenderness.  Tender right foot to palpation, no significant swelling noted  Lymphadenopathy:    She has no cervical adenopathy.  Neurological: She is alert and oriented to person, place, and time. Coordination normal.  Skin: Skin is warm and dry. No rash noted. No erythema.  Psychiatric: She has a normal mood and affect. Her behavior is normal.  Judgment and thought content normal.    Assessment and Plan

## 2013-05-20 NOTE — Assessment & Plan Note (Signed)
Mild shortness of breath symptoms typically with exertion. Likely from obesity and deconditioning. Suggested symptoms get worse, further testing could be done.

## 2013-07-01 ENCOUNTER — Emergency Department: Payer: Self-pay | Admitting: Emergency Medicine

## 2014-02-10 ENCOUNTER — Ambulatory Visit: Payer: Self-pay | Admitting: Podiatry

## 2014-03-11 DIAGNOSIS — N183 Chronic kidney disease, stage 3 (moderate): Secondary | ICD-10-CM | POA: Diagnosis not present

## 2014-03-11 DIAGNOSIS — N2581 Secondary hyperparathyroidism of renal origin: Secondary | ICD-10-CM | POA: Diagnosis not present

## 2014-03-11 DIAGNOSIS — I129 Hypertensive chronic kidney disease with stage 1 through stage 4 chronic kidney disease, or unspecified chronic kidney disease: Secondary | ICD-10-CM | POA: Diagnosis not present

## 2014-03-11 DIAGNOSIS — R809 Proteinuria, unspecified: Secondary | ICD-10-CM | POA: Diagnosis not present

## 2014-05-04 DIAGNOSIS — I10 Essential (primary) hypertension: Secondary | ICD-10-CM | POA: Diagnosis not present

## 2014-05-04 DIAGNOSIS — M19079 Primary osteoarthritis, unspecified ankle and foot: Secondary | ICD-10-CM | POA: Diagnosis not present

## 2014-05-04 DIAGNOSIS — M25474 Effusion, right foot: Secondary | ICD-10-CM | POA: Diagnosis not present

## 2014-05-04 DIAGNOSIS — M25471 Effusion, right ankle: Secondary | ICD-10-CM | POA: Diagnosis not present

## 2014-05-04 DIAGNOSIS — M8580 Other specified disorders of bone density and structure, unspecified site: Secondary | ICD-10-CM | POA: Diagnosis not present

## 2014-05-04 DIAGNOSIS — M79671 Pain in right foot: Secondary | ICD-10-CM | POA: Diagnosis not present

## 2014-05-04 DIAGNOSIS — M19071 Primary osteoarthritis, right ankle and foot: Secondary | ICD-10-CM | POA: Diagnosis not present

## 2014-05-04 DIAGNOSIS — R2241 Localized swelling, mass and lump, right lower limb: Secondary | ICD-10-CM | POA: Diagnosis not present

## 2014-05-04 DIAGNOSIS — M25571 Pain in right ankle and joints of right foot: Secondary | ICD-10-CM | POA: Diagnosis not present

## 2014-05-16 IMAGING — CR DG CHEST 2V
1 series · 2 of 2 positions shown · non-contrast
Comparison: none

REASON FOR EXAM: SOB
COMMENTS:

[Series 1: w chest pa · 0.14mm/px · 2 of 2 slices shown]
[im 1/2]
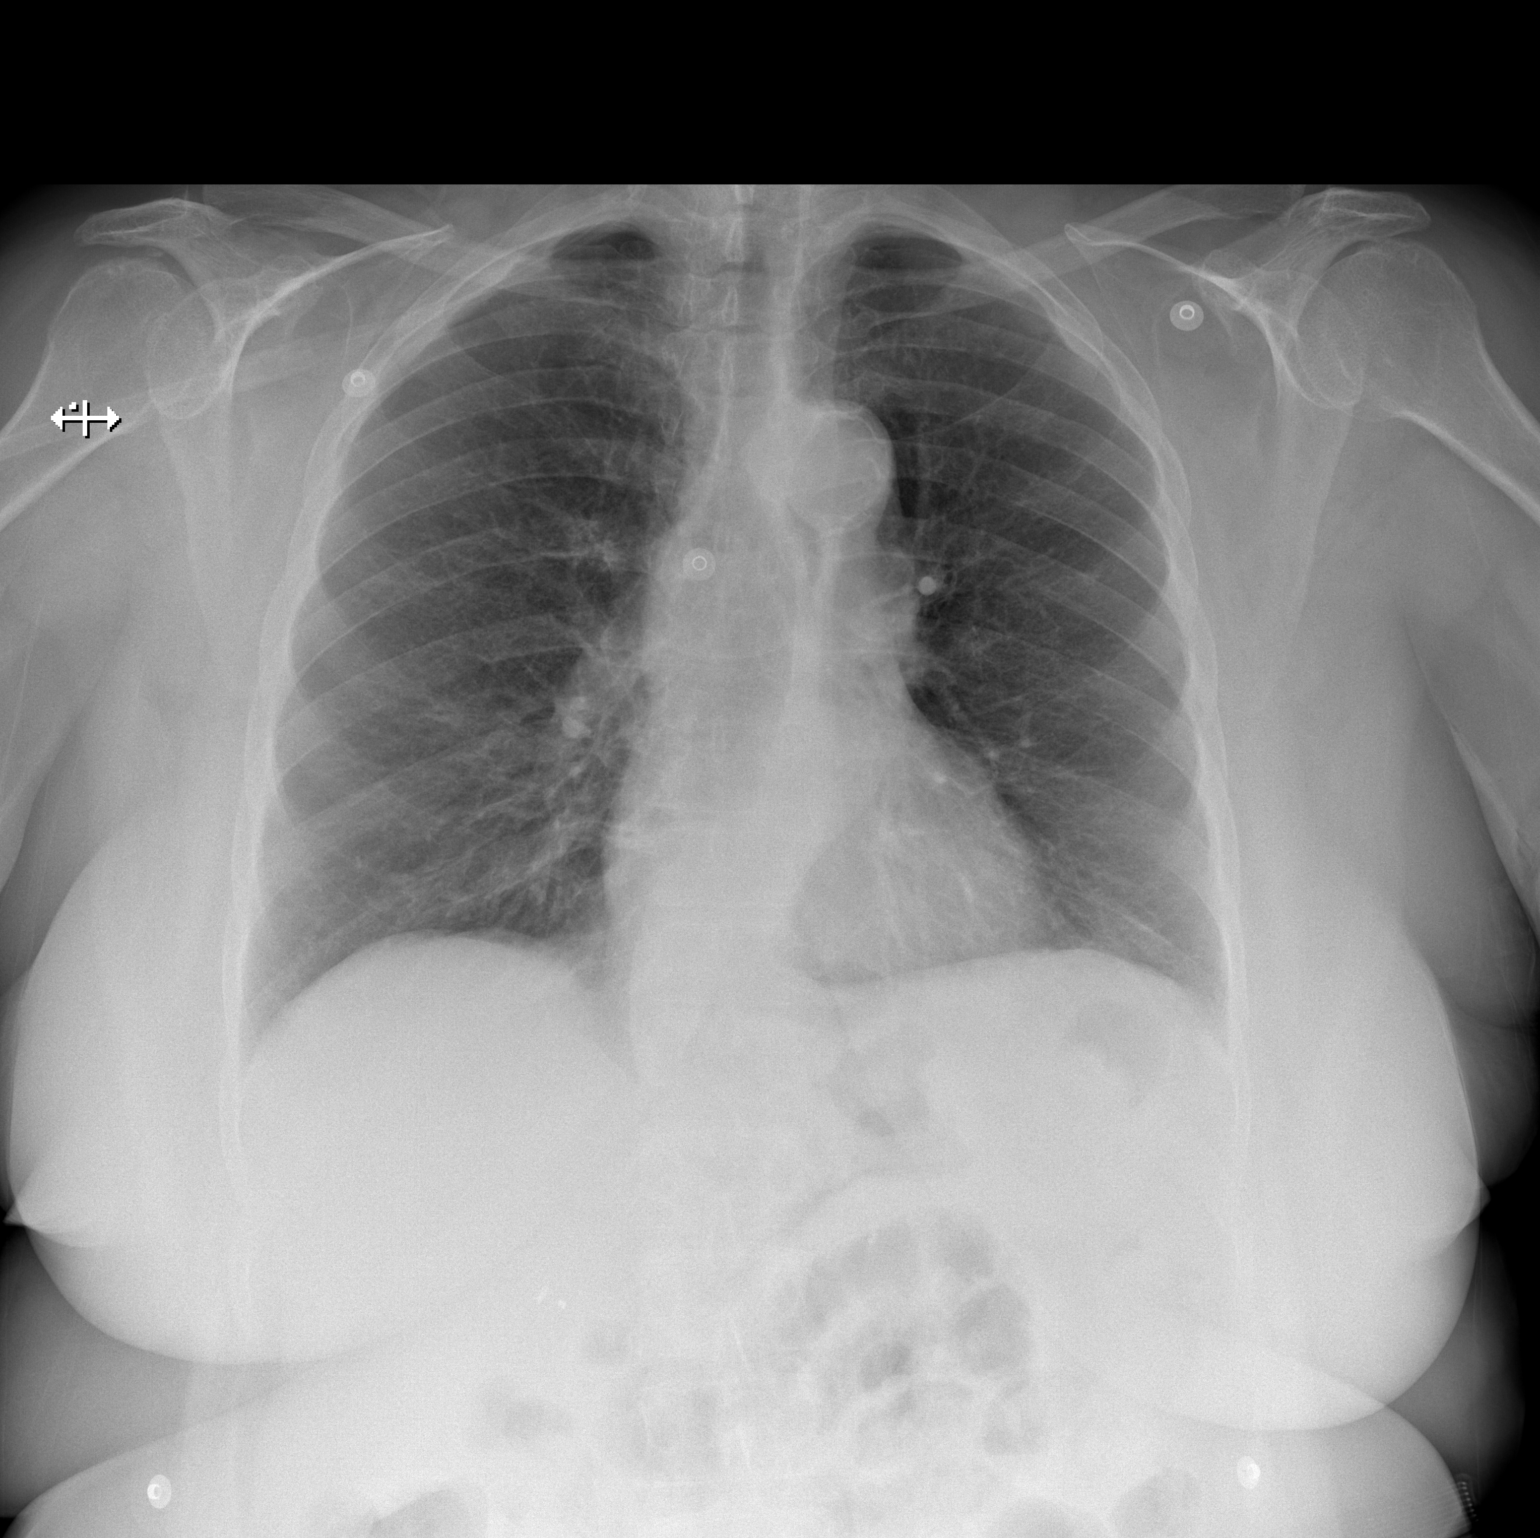
[im 2/2]
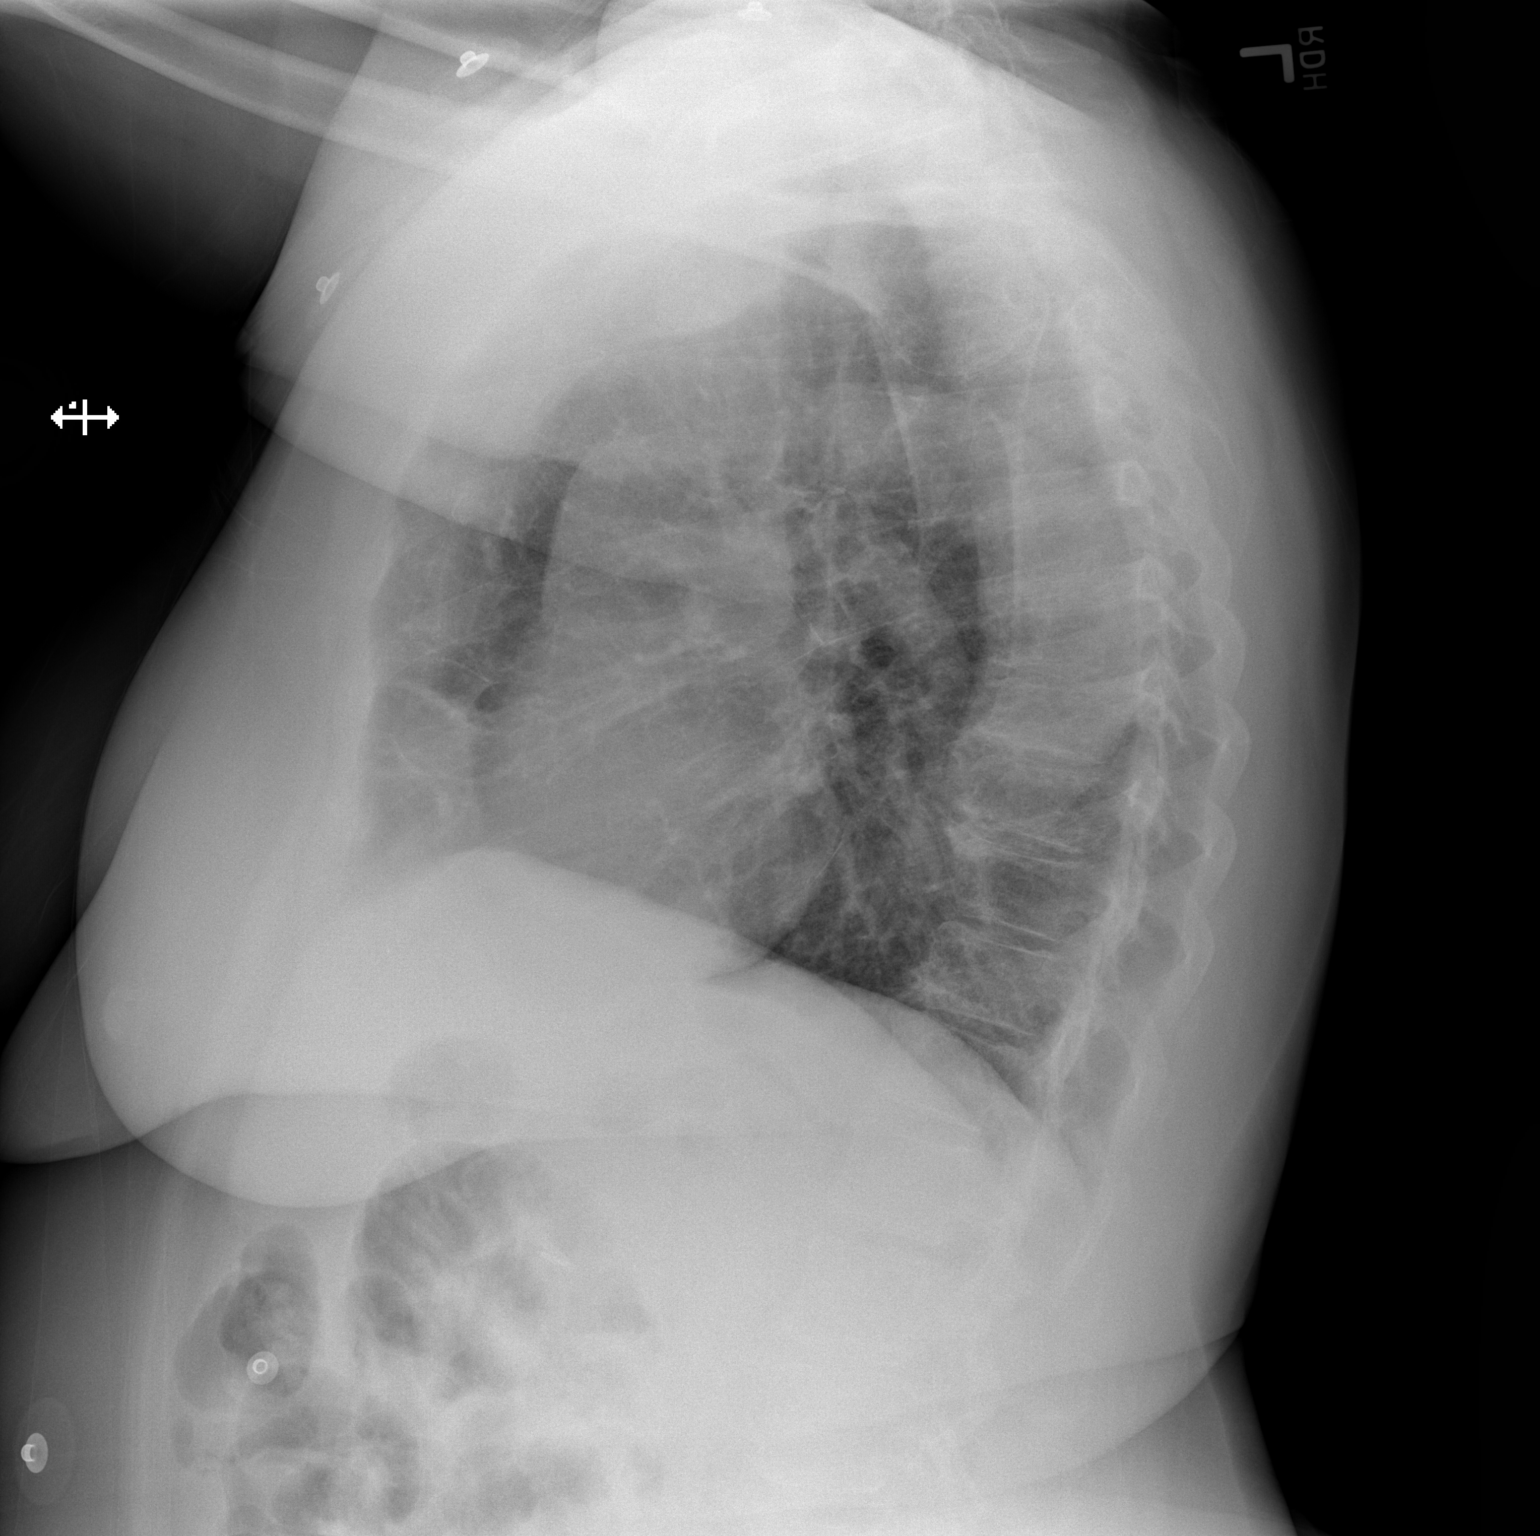

[2 of 2 positions shown; findings below may reference images not displayed]

PROCEDURE:     DXR - DXR CHEST PA (OR AP) AND LATERAL  - November 04, 2011  [DATE]

RESULT:     Comparison is made to the study 30 April, 2010.

The lungs are clear. The heart and pulmonary vessels are normal. The bony
and mediastinal structures are unremarkable. There is no effusion. There is
no pneumothorax or evidence of congestive failure.
IMPRESSION: No acute cardiopulmonary disease.

[REDACTED]

## 2014-05-27 DIAGNOSIS — M19171 Post-traumatic osteoarthritis, right ankle and foot: Secondary | ICD-10-CM | POA: Diagnosis not present

## 2014-06-02 NOTE — Op Note (Signed)
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   PROCEDURES:  1. Microlaryngoscopy with microsurgical excision of right true vocal cord lesion.  2. Rigid esophagoscopy.   SURGEON: Sammuel Hines. Richardson Landry, MD  ANESTHESIA: General endotracheal.   INDICATIONS: Patient with a history of hoarseness and dysphasia with note of a polypoid right true vocal cord lesion on endoscopy.   FINDINGS: There was a polypoid lesion in the right true vocal cord. The mucosa of the posterior medial aspect of the polyp was eroded with an area that appeared consistent with a granuloma noted there. The lesion excised cleanly from the underlying vocalis muscle, however, and did not appear to infiltrate the muscle. There was some mild polypoid degeneration of the left vocal cord without leukoplakia. No other lesions of the larynx or epiglottis were visualized. The esophagus was found to be free of any mass lesions or ulceration, however, there was gross reflux noted during esophagoscopy.   COMPLICATIONS: None.   DESCRIPTION OF PROCEDURE: After obtaining informed consent, the patient was taken to the Operating Room and placed in supine position. After induction of general endotracheal anesthesia, the patient was turned 90 degrees and the head draped with the eyes protected. A tooth guard was used to protect the upper gums. A lubricated rigid esophagoscope was then carefully passed through the mouth into the hypopharynx and gently into the cervical esophagus. The scope was easily advanced along the length of the esophagus down to the gastroesophageal junction frequently suctioning as there was a bit of reflux notable. The esophageal mucosa was carefully inspected during this process. No  lesions were seen, just gross reflux. The scope was removed. There was absolutely no trauma to the esophageal mucosa during the endoscopy. A Dedo laryngoscope was then used to carefully inspect the airway. The hypopharynx, postcricoid region, epiglottis, tongue base and endolarynx were all carefully inspected. The only lesion of concern involved the vocal cord. Patient was placed into suspension with the vocal cord lesion visualized. The lesion was injected with 1% lidocaine with epinephrine 1:200,000. The microscope was used for better visualization during the procedure. Utilizing microlaryngeal scissors the lesion was then carefully excised preserving the underlying vocalis muscle. Enough mucosa was preserved to allow the mucosa to drape over the defect from where the lesion was excised. The lesion did not appear to infiltrate the underlying vocalis muscle. Afrin moistened pledgets were used to control minor bleeding. Left vocal cord had some polypoid degeneration but there was not a large formed polyp and certainly no leukoplakia or ulceration requiring any biopsy. The lesion was sent in formalin for pathology. The patient was then returned to the anesthesiologist for awakening. She was awakened and taken to the recovery room in good condition postoperatively. Blood loss was minimal.    ____________________________ Sammuel Hines. Richardson Landry, MD psb:cms D: 12/06/2011 09:11:04 ET T: 12/06/2011 09:28:04 ET JOB#: [id number]  cc: Sammuel Hines. Richardson Landry, MD, <Dictator> Riley Nearing MD ELECTRONICALLY SIGNED 12/20/2011 8:25

## 2014-06-03 IMAGING — CR DG CHEST 2V
1 series · 2 of 2 positions shown · non-contrast
Comparison: none

REASON FOR EXAM: surgical clearance
COMMENTS:

PROCEDURE:     ADWOA - ADWOA CHEST PA (OR AP) AND LAT  - November 22, 2011 [DATE]
RESULT:     Comparison is made to the study 04 November, 2011.
Cardiac silhouette appears normal. There is no edema, infiltrate, effusion
or pneumothorax. The bony and mediastinal structures appear unremarkable.

[Series 1: pa · 0.17mm/px · 2 of 2 slices shown]
[im 1/2]
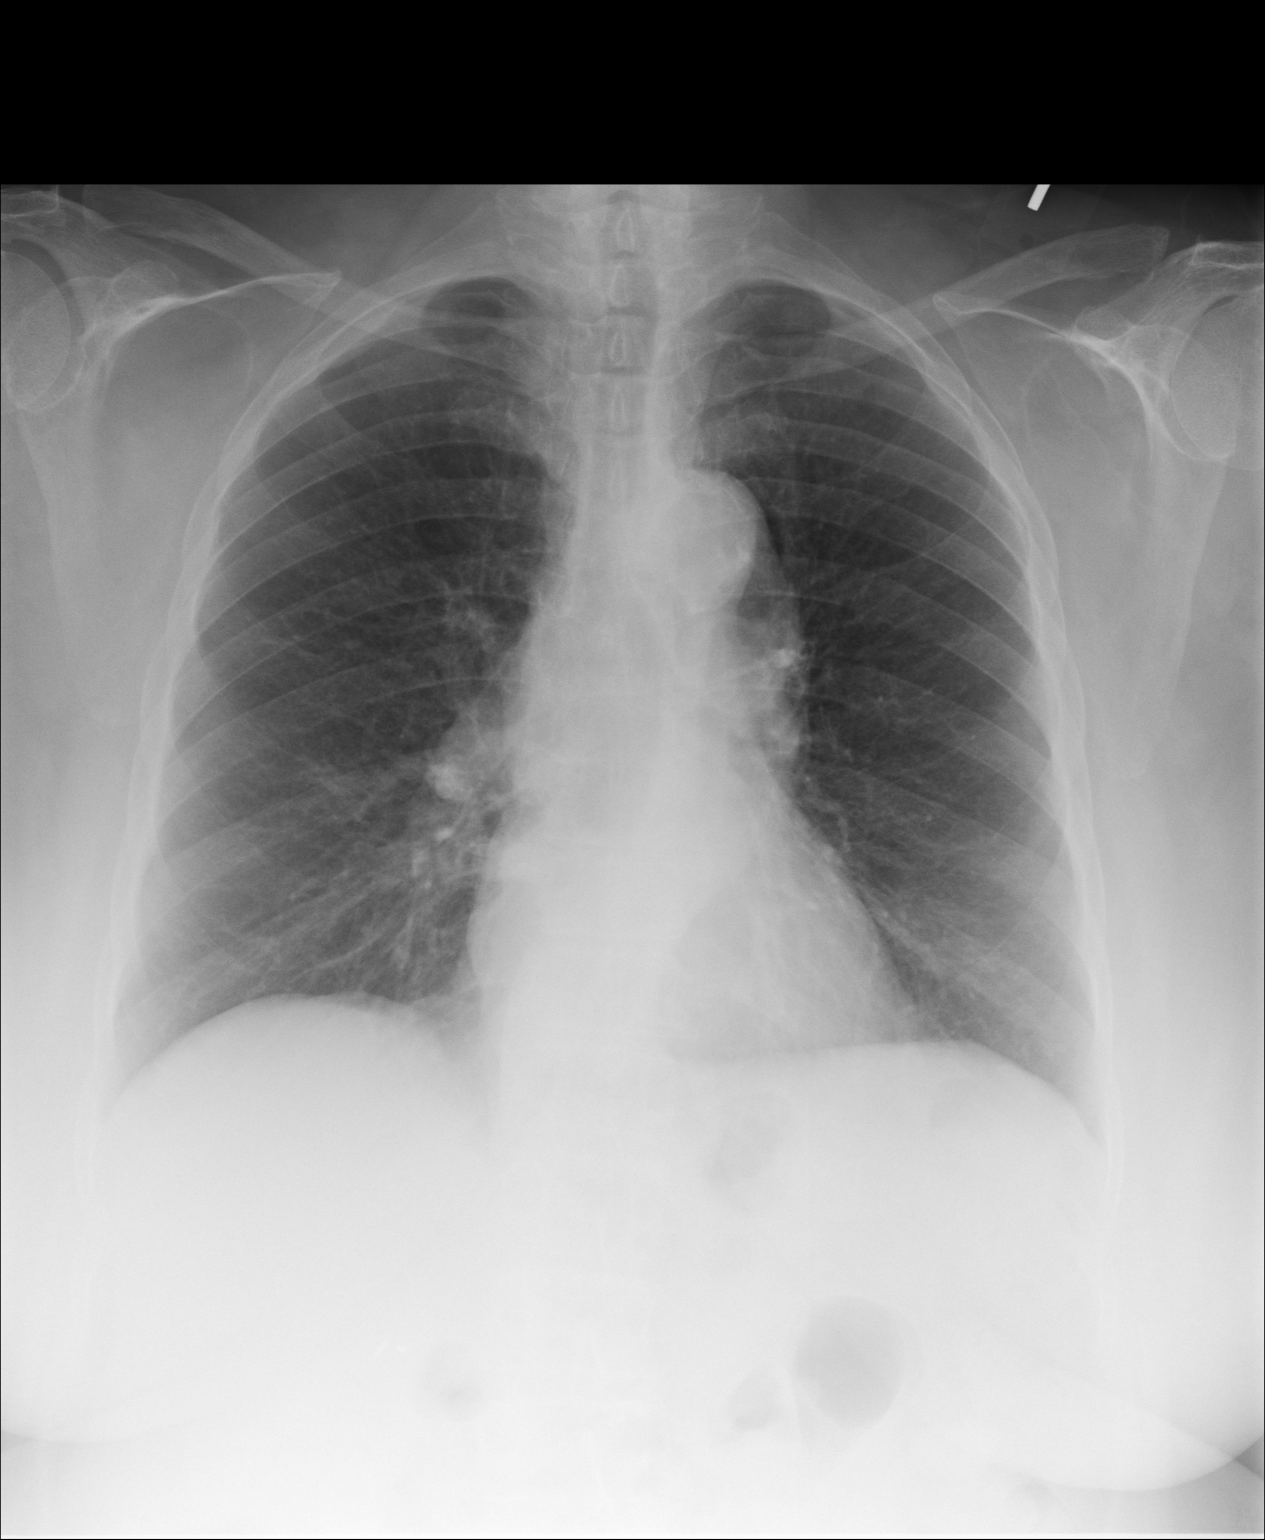
[im 2/2]
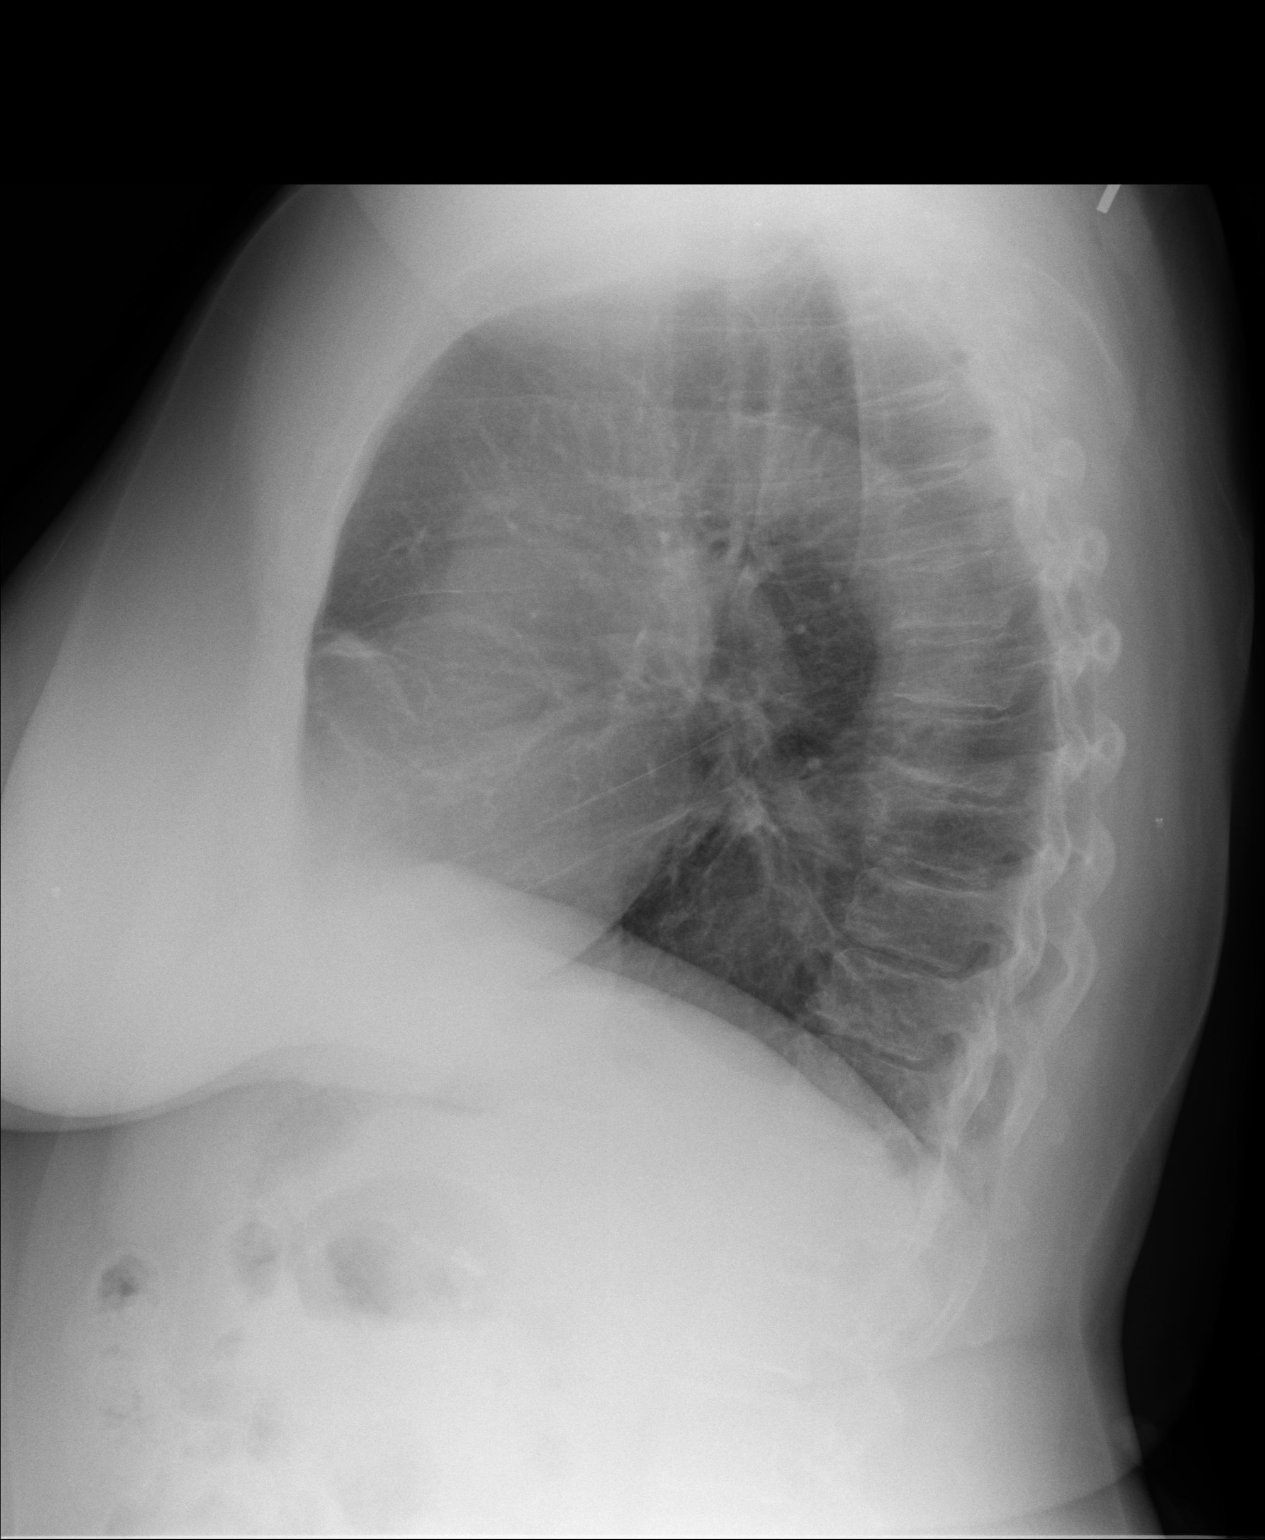

[2 of 2 positions shown; findings below may reference images not displayed]

IMPRESSION: No acute cardiopulmonary disease evident. Atherosclerotic
calcifications noted in the aortic arch.

[REDACTED]

## 2014-06-06 NOTE — H&P (Signed)
PATIENT NAME:  Katherine [name], Katherine [name] MR#:  [id number] DATE OF BIRTH:  1940-07-14  DATE OF ADMISSION:  01/22/2013  ADMITTING PHYSICIAN:  Dr. Gladstone Lighter    PRIMARY CARE PHYSICIAN: [facility].   CHIEF COMPLAINT: Swelling of the left side of the face.   HISTORY OF PRESENT ILLNESS: Mr. Schatzman is a 74 year old African American female with a past medical history significant for hypertension on enalapril for several years now, acid reflux disease. Presents to the hospital secondary to noticeable swelling of the left side of the face, including the left side of the lip. The patient said she noticed that her mouth was itching all over last evening before she went to bed, but when she woke up this morning, she noticed that her lip was swollen and also the left side of the lower face, and the jaw region was swollen.  She denies any trouble swallowing food, no tongue swelling, no trouble breathing. She has not had prior allergic reactions before, and she denies eating anything new or seafood yesterday. No fevers, chills or difficulty breathing.  Her itching has subsided and there is no rash present.   PAST MEDICAL HISTORY:   1.  Hypertension. 2.  Gastroesophageal reflux disease.  3.  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   PAST SURGICAL HISTORY: None.   ALLERGIES TO MEDICATIONS: No previous known drug allergies.   CURRENT HOME MEDICATIONS:  1.  Enalapril 20 mg p.o. daily.  2.  Aspirin 81 mg p.o. daily.  3.  Omeprazole 40 mg p.o. in the evening.   SOCIAL HISTORY: Lives at home with her husband. Smokes about 4 cigarettes every day. Occasional alcohol use. Currently retired.   FAMILY HISTORY: Does not know anything about her parents.  REVIEW OF SYSTEMS: CONSTITUTIONAL: No fever, fatigue or weakness.  EYES: No blurred vision, double vision, inflammation or glaucoma.  ENT: No tinnitus, ear pain, hearing loss, epistaxis or discharge.  RESPIRATORY: No cough, wheeze,  hemoptysis or COPD.  CARDIOVASCULAR: No chest pain, orthopnea, edema, arrhythmia, palpitations or syncope.  GASTROINTESTINAL: No nausea, vomiting, diarrhea, abdominal pain, hematemesis or melena.  GENITOURINARY: No dysuria, hematuria, renal calculus, frequency or incontinence.  ENDOCRINE: No polyuria, nocturia, thyroid problems, heat or cold intolerance.  HEMATOLOGY: No anemia, easy bruising or bleeding.  SKIN: No acne, rash or lesions.  Positive for swelling on the lower half of the face without any rash.  MUSCULOSKELETAL: No neck, back, shoulder pain, arthritis or gout.  NEUROLOGIC: No numbness, weakness, CVA, TIA or seizures.  PSYCHOLOGICAL:  No anxiety, insomnia, depression.  PHYSICAL EXAMINATION: VITAL SIGNS: Temperature 98.4 degrees Fahrenheit, pulse 108, respirations 20, blood pressure 213/100, pulse ox 92% on room air.  GENERAL: Well-built, well-nourished female, lying in bed, not in any acute distress.  HEENT: Normocephalic, atraumatic. Pupils equal, round, reacting to light. Anicteric sclerae. Extraocular movements intact. Oropharynx is clear. No tongue swelling noted. The patient does not have any teeth. No source of any infection on the left side  especially noted in the buccal cavity. She has slight tenderness on palpating the left submandibular lymph node. The left side of the lip is swollen and also the left side of the face. No rash noted. Oropharynx is clear without erythema, mass or exudates.  NECK: Supple. No thyromegaly, JVD or carotid bruits. No lymphadenopathy.  LUNGS: Moving air bilaterally. Minimal scattered expiratory wheeze. No rubs. No rales or use of accessory muscles for breathing. No crackles heard.  CARDIOVASCULAR: S1, S2, regular rate and rhythm. No murmurs, rubs  or gallops.  ABDOMEN: Soft, nontender, nondistended. No hepatosplenomegaly. Normal bowel sounds.  EXTREMITIES: No pedal edema. No clubbing or cyanosis, 2+ dorsalis pedis pulses palpable bilaterally.  SKIN:  No acne, rash or lesions.  LYMPHATICS: No cervical lymphadenopathy.  NEUROLOGIC: Cranial nerves II through IV remain intact. No focal motor or sensory deficits.  PSYCHIATRIC: The patient is awake, alert, oriented x 3.   LABORATORY DATA: WBC 6.0, hemoglobin 15.3, hematocrit 44.4, platelet count 219.   Sodium 137, potassium 4.1, chloride 107, bicarb 23, BUN 18, creatinine 1.5, glucose 119 and calcium of 9.3.   ALT 15, AST 22, alk phos 92, total bili 0.4 and albumin of 3.5. Troponin less than 0.02. CK 82, CK-MB 0.5. Urinalysis negative for any infection.   EKG showing normal sinus rhythm, heart rate of 91.   ASSESSMENT AND PLAN: This is a 74 year old female with history of hypertension on enalapril for several now, acid reflux, comes with left-sided facial swelling and lip swelling  since last night.  1.  Angioedema, likely from ACE inhibitor. We will hold enalapril. No dysphagia or tongue or throat swelling at this time. We will admit under observation. Continue IV Solu-Medrol, Pepcid and Benadryl and monitor.  2.  Hypertension, elevated when she came in. Hold enalapril for now, improved. Start p.o. hydralazine and IV hydralazine p.r.n.  3.  Gastroesophageal reflux disease, continue Prilosec.  4.  Tobacco use disorder, counseled for 3 minutes against smoking. Refused nicotine patch at this time.   CODE STATUS: FULL CODE.   TIME SPENT ON ADMISSION: 50 minutes.     ____________________________ Gladstone Lighter, MD rk:dmm D: 01/22/2013 12:20:32 ET T: 01/22/2013 12:31:39 ET JOB#: [id number]  cc: Gladstone Lighter, MD, <Dictator> [place] Gladstone Lighter MD ELECTRONICALLY SIGNED 02/25/2013 11:17

## 2014-06-06 NOTE — Discharge Summary (Signed)
PATIENT NAME:  Katherine [name] MR#:  [id number] DATE OF BIRTH:  11-15-1940  DATE OF ADMISSION:  01/22/2013 DATE OF DISCHARGE:  01/23/2013  ADMITTING PHYSICIAN: Gladstone Lighter, MD  DISCHARGING PHYSICIAN: Gladstone Lighter, MD  PRIMARY CARE PHYSICIAN: [facility].  [place]: None.   DISCHARGE DIAGNOSES:  1.  Angioedema secondary to enalapril.  2.  Hypertension.  3.  Gastroesophageal reflux disease.  4.  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 5.  Tobacco use disorder.  6.  Hypomagnesemia, which was replaced.   DISCHARGE HOME MEDICATIONS:  1.  Omeprazole 40 mg p.o. daily.  2.  Bayer aspirin 81 mg p.o. daily.  3.  Hydralazine 25 mg p.o. b.i.d.  4.  Prednisone taper over the next 5 days.   DISCHARGE DIET: Low-sodium diet.   DISCHARGE ACTIVITY: As tolerated.   FOLLOWUP INSTRUCTIONS: PCP follow-up in 2 weeks.   LABORATORY AND IMAGING STUDIES PRIOR TO DISCHARGE:  WBC 7, hemoglobin 13.4, hematocrit 40.5, and platelet count 209.    Sodium 138, potassium 4.2, chloride 108, bicarb 21, BUN 19, creatinine 1.4, glucose 137, and calcium 9.2. Magnesium 1.7. Urinalysis negative for any infection. Troponins negative.   Chest x-ray on admission showing clear lung fields with no active cardiopulmonary disease.   ALT 15, AST 22, alkaline phosphatase 92, total bilirubin 0.4, and albumin 3.5.   BRIEF HOSPITAL COURSE: Katherine [name] is a pleasant 74 year old African American female past medical history significant for hypertension and gastroesophageal reflux disease who presents to the hospital secondary to lip swelling and left side of the face swelling.  1.  Angioedema. The patient has been on enalapril for several years now. Has not had any previous allergic reactions. The patient denies eating any seafood or being exposed to anything new. She woke up on the day of admission with swelling of the left side of the lip and also left side of the face  and also significant itching. So started on IV Solu-Medrol, Pepcid, and Benadryl and completely resolved the swelling in less than 24 hours. Her enalapril was stopped and she was advised not to go back on the same medication. She is being discharged on a prednisone taper pack. That will help with residual swelling and also her minimal scattered wheezing in the lungs.  2.  Tobacco use disorder and possible underlying COPD. No previous PFTs were done to diagnose COPD, but she did have a little bit of scattered wheezing on admission, improved with Solu-Medrol. The patient will finish up the prednisone taper pack and will follow up with PCP as an outpatient. Was strongly advised to stop smoking.  3.  Hypertension. Since enalapril was stopped, she was started on p.o. hydralazine. That seems to have controlled her pressure well while in the hospital so she is being discharged on the same, and she will need to follow up with PCP to see if the dose needs to be adjusted.  4.  Gastroesophageal reflux disease. The patient is on omeprazole.  The patient's course has been otherwise uneventful in the hospital.   DISCHARGE CONDITION: Stable.   DISCHARGE DISPOSITION: Home.   TIME SPENT ON DISCHARGE: 40 minutes.  ____________________________ Gladstone Lighter, MD rk:sb D: 01/23/2013 14:08:00 ET T: 01/23/2013 14:37:28 ET JOB#: [id number]  cc: Gladstone Lighter, MD, <Dictator> [place] MD ELECTRONICALLY SIGNED 02/19/2013 7:25

## 2014-06-29 DIAGNOSIS — D631 Anemia in chronic kidney disease: Secondary | ICD-10-CM | POA: Diagnosis not present

## 2014-06-29 DIAGNOSIS — N183 Chronic kidney disease, stage 3 (moderate): Secondary | ICD-10-CM | POA: Diagnosis not present

## 2014-06-29 DIAGNOSIS — I129 Hypertensive chronic kidney disease with stage 1 through stage 4 chronic kidney disease, or unspecified chronic kidney disease: Secondary | ICD-10-CM | POA: Diagnosis not present

## 2014-06-29 DIAGNOSIS — N2581 Secondary hyperparathyroidism of renal origin: Secondary | ICD-10-CM | POA: Diagnosis not present

## 2014-08-05 DIAGNOSIS — E559 Vitamin D deficiency, unspecified: Secondary | ICD-10-CM | POA: Insufficient documentation

## 2014-08-05 DIAGNOSIS — T783XXA Angioneurotic edema, initial encounter: Secondary | ICD-10-CM | POA: Insufficient documentation

## 2014-08-05 DIAGNOSIS — M1990 Unspecified osteoarthritis, unspecified site: Secondary | ICD-10-CM | POA: Insufficient documentation

## 2014-08-05 DIAGNOSIS — I517 Cardiomegaly: Secondary | ICD-10-CM | POA: Insufficient documentation

## 2014-08-05 DIAGNOSIS — L281 Prurigo nodularis: Secondary | ICD-10-CM | POA: Insufficient documentation

## 2014-08-05 DIAGNOSIS — N183 Chronic kidney disease, stage 3 unspecified: Secondary | ICD-10-CM | POA: Insufficient documentation

## 2014-08-05 DIAGNOSIS — F419 Anxiety disorder, unspecified: Secondary | ICD-10-CM | POA: Insufficient documentation

## 2014-08-05 DIAGNOSIS — Z720 Tobacco use: Secondary | ICD-10-CM | POA: Insufficient documentation

## 2014-08-05 DIAGNOSIS — J449 Chronic obstructive pulmonary disease, unspecified: Secondary | ICD-10-CM | POA: Insufficient documentation

## 2014-08-05 DIAGNOSIS — K5909 Other constipation: Secondary | ICD-10-CM | POA: Insufficient documentation

## 2014-08-05 DIAGNOSIS — K219 Gastro-esophageal reflux disease without esophagitis: Secondary | ICD-10-CM | POA: Insufficient documentation

## 2014-08-12 ENCOUNTER — Ambulatory Visit (INDEPENDENT_AMBULATORY_CARE_PROVIDER_SITE_OTHER): Payer: Commercial Managed Care - HMO | Admitting: Family Medicine

## 2014-08-12 ENCOUNTER — Encounter: Payer: Self-pay | Admitting: Family Medicine

## 2014-08-12 VITALS — BP 128/87 | HR 77 | Temp 97.9°F | Wt 237.0 lb

## 2014-08-12 DIAGNOSIS — I119 Hypertensive heart disease without heart failure: Secondary | ICD-10-CM

## 2014-08-12 DIAGNOSIS — N183 Chronic kidney disease, stage 3 unspecified: Secondary | ICD-10-CM

## 2014-08-12 DIAGNOSIS — Z720 Tobacco use: Secondary | ICD-10-CM

## 2014-08-12 DIAGNOSIS — M79671 Pain in right foot: Secondary | ICD-10-CM

## 2014-08-12 DIAGNOSIS — E559 Vitamin D deficiency, unspecified: Secondary | ICD-10-CM

## 2014-08-12 DIAGNOSIS — K219 Gastro-esophageal reflux disease without esophagitis: Secondary | ICD-10-CM | POA: Diagnosis not present

## 2014-08-12 DIAGNOSIS — K5909 Other constipation: Secondary | ICD-10-CM | POA: Diagnosis not present

## 2014-08-12 DIAGNOSIS — L280 Lichen simplex chronicus: Secondary | ICD-10-CM | POA: Insufficient documentation

## 2014-08-12 DIAGNOSIS — F419 Anxiety disorder, unspecified: Secondary | ICD-10-CM

## 2014-08-12 DIAGNOSIS — J449 Chronic obstructive pulmonary disease, unspecified: Secondary | ICD-10-CM

## 2014-08-12 MED ORDER — TRIAMCINOLONE ACETONIDE 0.1 % EX CREA: 1.0000 "application " | TOPICAL_CREAM | Freq: Two times a day (BID) | CUTANEOUS | Status: DC | PRN

## 2014-08-12 NOTE — Assessment & Plan Note (Signed)
Encouraged her to quit smoking.  ?

## 2014-08-12 NOTE — Assessment & Plan Note (Signed)
Check level today and replace if needed 

## 2014-08-12 NOTE — Assessment & Plan Note (Signed)
Continue current meds; try to avoid salt; avoid decongestants

## 2014-08-12 NOTE — Patient Instructions (Addendum)
Okay to try Miralax one capful in water every day to keep stools soft Stay away from nonsteroidals like advil and goody powders and ibuprofen We'll contact you with the labs Try the new cream for your rash, try to not scratch; keep fingernails trimmed close and short Try to work on a little bit of weight loss; limit fatty foods and keep portions a good size and don't snack late in the evenings Try switching to a milder soap like Dove and pat dry after bathing I'll encourage you to quit smoking -- it will be the best thing you do for your health and your wallet I'm here for you if needed prior to your next appointment, so just call or schedule a visit if I can help you  Smoking Cessation, Tips for Success If you are ready to quit smoking, congratulations! You have chosen to help yourself be healthier. Cigarettes bring nicotine, tar, carbon monoxide, and other irritants into your body. Your lungs, heart, and blood vessels will be able to work better without these poisons. There are many different ways to quit smoking. Nicotine gum, nicotine patches, a nicotine inhaler, or nicotine nasal spray can help with physical craving. Hypnosis, support groups, and medicines help break the habit of smoking. WHAT THINGS CAN I DO TO MAKE QUITTING EASIER?  Here are some tips to help you quit for good:  Pick a date when you will quit smoking completely. Tell all of your friends and family about your plan to quit on that date.  Do not try to slowly cut down on the number of cigarettes you are smoking. Pick a quit date and quit smoking completely starting on that day.  Throw away all cigarettes.   Clean and remove all ashtrays from your home, work, and car.  On a card, write down your reasons for quitting. Carry the card with you and read it when you get the urge to smoke.  Cleanse your body of nicotine. Drink enough water and fluids to keep your urine clear or pale yellow. Do this after quitting to flush the  nicotine from your body.  Learn to predict your moods. Do not let a bad situation be your excuse to have a cigarette. Some situations in your life might tempt you into wanting a cigarette.  Never have "just one" cigarette. It leads to wanting another and another. Remind yourself of your decision to quit.  Change habits associated with smoking. If you smoked while driving or when feeling stressed, try other activities to replace smoking. Stand up when drinking your coffee. Brush your teeth after eating. Sit in a different chair when you read the paper. Avoid alcohol while trying to quit, and try to drink fewer caffeinated beverages. Alcohol and caffeine may urge you to smoke.  Avoid foods and drinks that can trigger a desire to smoke, such as sugary or spicy foods and alcohol.  Ask people who smoke not to smoke around you.  Have something planned to do right after eating or having a cup of coffee. For example, plan to take a walk or exercise.  Try a relaxation exercise to calm you down and decrease your stress. Remember, you may be tense and nervous for the first 2 weeks after you quit, but this will pass.  Find new activities to keep your hands busy. Play with a pen, coin, or rubber band. Doodle or draw things on paper.  Brush your teeth right after eating. This will help cut down on the craving  for the taste of tobacco after meals. You can also try mouthwash.   Use oral substitutes in place of cigarettes. Try using lemon drops, carrots, cinnamon sticks, or chewing gum. Keep them handy so they are available when you have the urge to smoke.  When you have the urge to smoke, try deep breathing.  Designate your home as a nonsmoking area.  If you are a heavy smoker, ask your health care provider about a prescription for nicotine chewing gum. It can ease your withdrawal from nicotine.  Reward yourself. Set aside the cigarette money you save and buy yourself something nice.  Look for support  from others. Join a support group or smoking cessation program. Ask someone at home or at work to help you with your plan to quit smoking.  Always ask yourself, "Do I need this cigarette or is this just a reflex?" Tell yourself, "Today, I choose not to smoke," or "I do not want to smoke." You are reminding yourself of your decision to quit.  Do not replace cigarette smoking with electronic cigarettes (commonly called e-cigarettes). The safety of e-cigarettes is unknown, and some may contain harmful chemicals.  If you relapse, do not give up! Plan ahead and think about what you will do the next time you get the urge to smoke. HOW WILL I FEEL WHEN I QUIT SMOKING? You may have symptoms of withdrawal because your body is used to nicotine (the addictive substance in cigarettes). You may crave cigarettes, be irritable, feel very hungry, cough often, get headaches, or have difficulty concentrating. The withdrawal symptoms are only temporary. They are strongest when you first quit but will go away within 10-14 days. When withdrawal symptoms occur, stay in control. Think about your reasons for quitting. Remind yourself that these are signs that your body is healing and getting used to being without cigarettes. Remember that withdrawal symptoms are easier to treat than the major diseases that smoking can cause.  Even after the withdrawal is over, expect periodic urges to smoke. However, these cravings are generally short lived and will go away whether you smoke or not. Do not smoke! WHAT RESOURCES ARE AVAILABLE TO HELP ME QUIT SMOKING? Your health care provider can direct you to community resources or hospitals for support, which may include:  Group support.  Education.  Hypnosis.  Therapy. Document Released: 10/29/2003 Document Revised: 06/16/2013 Document Reviewed: 07/18/2012 [facility] Patient Information 2015 [place], [place]. This information is not intended to replace advice given to you by your health  care provider. Make sure you discuss any questions you have with your health care provider.

## 2014-08-12 NOTE — Assessment & Plan Note (Signed)
Avoid NSAIDS, stay hydrated, and check Cr and GFR

## 2014-08-12 NOTE — Assessment & Plan Note (Signed)
Asymptomatic; encourged to quit smoking

## 2014-08-12 NOTE — Assessment & Plan Note (Signed)
Limit triggers and keep working on weight loss

## 2014-08-12 NOTE — Assessment & Plan Note (Signed)
Continue same dose, well-controlled

## 2014-08-12 NOTE — Progress Notes (Signed)
BP 128/87 mmHg  Pulse 77  Temp(Src) 97.9 F (36.6 C)  Wt 237 lb (107.502 kg)  SpO2 96%   Subjective:    Patient ID: Katherine [name], female    DOB: 11/19/1940, 74 y.o.   MRN: [id number]  HPI: Katherine [name] is a 74 y.o. female  Chief Complaint  Patient presents with  . Hypertension  . Chronic Kidney Disease   Hypertension Patient has had hypertension for many years, was having headaches at [facility], BP was high Checking blood pressure away from here?  no Hypertension-associated complications:  nephropathy, sees kidney doctor If taking medicines, are you taking them regularly?  yes Siblings / family history: Does high blood pressure run in your family?   not sure Salt:  Trying to limit sodium / salt when buying foods at the grocery store?  yes  Do you try to limit added salt when cooking and at the table?  yesbut sometimes adds salt Sweets/licorice:  Do you eat a lot of sweets or eat black licorice?  YES, no black licorice Sedentary lifestyle:  Exercise/activity level:  sedentary (essential NO activity) Smoking: Do you smoke?  YES Sudafed (decongestants): Do you use any OTC decongestant products like Allegra-D, Claritin-D, Zyrtec-D, Tylenol Cold and Sinus, etc.?  no Sees kidney doctor for her chronic kidney disease; taking tylenol for arthritis and knows to not take Goody powders and Advil etc.; good water drinker  She takes fluoxetine for anxiety, fear of falling  Wearing the boot on the right foot, sees [facility] orthopaedics for that  Obesity; she has not been able to lose any weight; also has constipation  Relevant past medical, surgical, family and social history reviewed and updated as indicated. Interim medical history since our last visit reviewed. Allergies and medications reviewed and updated.  Review of Systems  Gastrointestinal: Positive for constipation.  Skin: Positive for rash (went to the dermatologist, but didn't really help; rash on left arm).   Per HPI  unless specifically indicated above   Current outpatient prescriptions:  .  acetaminophen (TYLENOL) 650 MG CR tablet, Take 650 mg by mouth every 8 (eight) hours as needed for pain., Disp: , Rfl:  .  aspirin 81 MG tablet, Take 81 mg by mouth daily., Disp: , Rfl:  .  cetirizine (ZYRTEC) 10 MG tablet, Take 5 mg by mouth at bedtime. , Disp: , Rfl:  .  cholecalciferol (VITAMIN D) 400 UNITS TABS tablet, Take 400 Units by mouth daily., Disp: , Rfl:  .  FLUoxetine (PROZAC) 10 MG tablet, Take 10 mg by mouth daily., Disp: , Rfl:  .  hydrALAZINE (APRESOLINE) 25 MG tablet, Take 25 mg by mouth 2 (two) times daily., Disp: , Rfl:  .  omeprazole (PRILOSEC) 40 MG capsule, Take 40 mg by mouth every other day. Take 1 every other day PRN, Disp: , Rfl:  .  triamcinolone cream (KENALOG) 0.1 %, Apply 1 application topically 2 (two) times daily as needed., Disp: 45 g, Rfl: 2     Objective:    BP 128/87 mmHg  Pulse 77  Temp(Src) 97.9 F (36.6 C)  Wt 237 lb (107.502 kg)  SpO2 96%  Wt Readings from Last 3 Encounters:  08/12/14 237 lb (107.502 kg)  02/11/14 233 lb (105.688 kg)  05/20/13 232 lb 8 oz (105.461 kg)    Physical Exam  Constitutional: She appears well-developed and well-nourished. No distress.  Morbidly obese  HENT:  Head: Normocephalic and atraumatic.  Eyes: EOM are normal. No scleral icterus.  Neck: No thyromegaly present.  Cardiovascular: Normal rate, regular rhythm and normal heart sounds.   No murmur heard. Pulmonary/Chest: Effort normal and breath sounds normal. No respiratory distress. She has no wheezes.  Abdominal: Soft. Bowel sounds are normal. She exhibits no distension. There is no tenderness. There is no guarding.  Musculoskeletal: Normal range of motion. She exhibits no edema.  Neurological: She is alert. She exhibits normal muscle tone.  Skin: Skin is warm and dry. Rash (lichenified hypertrophic papular lesions extensor surfaces arms and legs) noted. She is not diaphoretic. No  pallor.  Psychiatric: She has a normal mood and affect. Her behavior is normal. Judgment and thought content normal.       Assessment & Plan:   Problem List Items Addressed This Visit      Cardiovascular and Mediastinum   Hypertensive heart disease    Continue current meds; try to avoid salt; avoid decongestants      Relevant Orders   Lipid Panel w/o Chol/HDL Ratio   Comprehensive metabolic panel     Respiratory   COPD (chronic obstructive pulmonary disease)    Asymptomatic; encourged to quit smoking        Digestive   GERD (gastroesophageal reflux disease)    Limit triggers and keep working on weight loss        Musculoskeletal and Integument   Lichen simplex chronicus - Primary     Genitourinary   Chronic kidney disease    Avoid NSAIDS, stay hydrated, and check Cr and GFR      Relevant Orders   Comprehensive metabolic panel     Other   Right foot pain    Handled by [facility] doctor; wearing boot      Anxiety    Continue same dose, well-controlled      Tobacco abuse    Encouraged her to quit smoking      Vitamin D deficiency disease    Check level today and replace if needed      Relevant Orders   Vit D  25 hydroxy (rtn osteoporosis monitoring)    Other Visit Diagnoses    Other constipation        stay hydrated, increase fiber, try miralax    Relevant Orders    TSH        Follow up plan: Return in about 6 months (around 02/11/2015) for multiple medical issues.   Meds ordered this encounter  Medications  . triamcinolone cream (KENALOG) 0.1 %    Sig: Apply 1 application topically 2 (two) times daily as needed.    Dispense:  45 g    Refill:  2   An After Visit Summary was printed and given to the patient.

## 2014-08-12 NOTE — Assessment & Plan Note (Signed)
Handled by [facility] doctor; wearing boot

## 2014-08-13 LAB — TSH: TSH: 1.76 u[IU]/mL (ref 0.450–4.500)

## 2014-08-13 LAB — LIPID PANEL W/O CHOL/HDL RATIO
Cholesterol, Total: 158 mg/dL (ref 100–199)
HDL: 51 mg/dL (ref >39)
LDL CALC: 78 mg/dL (ref 0–99)
Triglycerides: 143 mg/dL (ref 0–149)
VLDL Cholesterol Cal: 29 mg/dL (ref 5–40)

## 2014-08-13 LAB — COMPREHENSIVE METABOLIC PANEL
ALT: 10 IU/L (ref 0–32)
AST: 14 IU/L (ref 0–40)
Albumin/Globulin Ratio: 1.3 (ref 1.1–2.5)
Albumin: 4.1 g/dL (ref 3.5–4.8)
Alkaline Phosphatase: 90 IU/L (ref 39–117)
BILIRUBIN TOTAL: 0.2 mg/dL (ref 0.0–1.2)
BUN/Creatinine Ratio: 12 (ref 11–26)
BUN: 19 mg/dL (ref 8–27)
CALCIUM: 9.8 mg/dL (ref 8.7–10.3)
CHLORIDE: 99 mmol/L (ref 97–108)
CO2: 23 mmol/L (ref 18–29)
Creatinine, Ser: 1.6 mg/dL — ABNORMAL HIGH (ref 0.57–1.00)
GFR calc Af Amer: 37 mL/min/{1.73_m2} — ABNORMAL LOW (ref >59)
GFR calc non Af Amer: 32 mL/min/{1.73_m2} — ABNORMAL LOW (ref >59)
Globulin, Total: 3.1 g/dL (ref 1.5–4.5)
Glucose: 89 mg/dL (ref 65–99)
Potassium: 4.8 mmol/L (ref 3.5–5.2)
SODIUM: 138 mmol/L (ref 134–144)
Total Protein: 7.2 g/dL (ref 6.0–8.5)

## 2014-08-13 LAB — VITAMIN D 25 HYDROXY (VIT D DEFICIENCY, FRACTURES): Vit D, 25-Hydroxy: 34.8 ng/mL (ref 30.0–100.0)

## 2014-08-24 ENCOUNTER — Other Ambulatory Visit: Payer: Self-pay | Admitting: Family Medicine

## 2014-08-31 ENCOUNTER — Other Ambulatory Visit: Payer: Self-pay | Admitting: Family Medicine

## 2014-08-31 MED ORDER — HYDRALAZINE HCL 25 MG PO TABS: 25.0000 mg | ORAL_TABLET | Freq: Two times a day (BID) | ORAL | Status: DC

## 2014-08-31 NOTE — Telephone Encounter (Signed)
Routing to provider  

## 2014-08-31 NOTE — Telephone Encounter (Signed)
pts husband came in to get refill on med for his wife. He had went to [facility] before but they didn't get it ready and he wanted to see about getting a refill sent over. The medication needed is hydralazine 25mg .

## 2014-09-01 NOTE — Telephone Encounter (Signed)
Refill request for Hydralazine 25mg   1 tablet daily  [facility]

## 2014-09-01 NOTE — Telephone Encounter (Signed)
I already took care of this yesterday.

## 2014-09-15 ENCOUNTER — Encounter: Payer: Self-pay | Admitting: *Deleted

## 2014-09-15 ENCOUNTER — Observation Stay
Admission: EM | Admit: 2014-09-15 | Discharge: 2014-09-19 | Disposition: A | Discharge status: Home / Self Care | Payer: Commercial Managed Care - HMO | Attending: Internal Medicine | Admitting: Internal Medicine

## 2014-09-15 DIAGNOSIS — R0602 Shortness of breath: Secondary | ICD-10-CM | POA: Diagnosis not present

## 2014-09-15 DIAGNOSIS — G4733 Obstructive sleep apnea (adult) (pediatric): Secondary | ICD-10-CM | POA: Diagnosis not present

## 2014-09-15 DIAGNOSIS — F329 Major depressive disorder, single episode, unspecified: Secondary | ICD-10-CM | POA: Insufficient documentation

## 2014-09-15 DIAGNOSIS — E559 Vitamin D deficiency, unspecified: Secondary | ICD-10-CM | POA: Diagnosis not present

## 2014-09-15 DIAGNOSIS — J441 Chronic obstructive pulmonary disease with (acute) exacerbation: Secondary | ICD-10-CM | POA: Diagnosis not present

## 2014-09-15 DIAGNOSIS — N179 Acute kidney failure, unspecified: Secondary | ICD-10-CM | POA: Insufficient documentation

## 2014-09-15 DIAGNOSIS — J9601 Acute respiratory failure with hypoxia: Secondary | ICD-10-CM | POA: Diagnosis not present

## 2014-09-15 DIAGNOSIS — I129 Hypertensive chronic kidney disease with stage 1 through stage 4 chronic kidney disease, or unspecified chronic kidney disease: Secondary | ICD-10-CM | POA: Diagnosis not present

## 2014-09-15 DIAGNOSIS — Z6837 Body mass index (BMI) 37.0-37.9, adult: Secondary | ICD-10-CM | POA: Insufficient documentation

## 2014-09-15 DIAGNOSIS — J449 Chronic obstructive pulmonary disease, unspecified: Secondary | ICD-10-CM | POA: Diagnosis present

## 2014-09-15 DIAGNOSIS — K219 Gastro-esophageal reflux disease without esophagitis: Secondary | ICD-10-CM | POA: Insufficient documentation

## 2014-09-15 DIAGNOSIS — N183 Chronic kidney disease, stage 3 unspecified: Secondary | ICD-10-CM | POA: Diagnosis present

## 2014-09-15 DIAGNOSIS — I10 Essential (primary) hypertension: Secondary | ICD-10-CM | POA: Diagnosis present

## 2014-09-15 DIAGNOSIS — F17210 Nicotine dependence, cigarettes, uncomplicated: Secondary | ICD-10-CM | POA: Diagnosis not present

## 2014-09-15 DIAGNOSIS — K449 Diaphragmatic hernia without obstruction or gangrene: Secondary | ICD-10-CM | POA: Diagnosis not present

## 2014-09-15 DIAGNOSIS — E66813 Obesity, class 3: Secondary | ICD-10-CM | POA: Diagnosis present

## 2014-09-15 DIAGNOSIS — I517 Cardiomegaly: Secondary | ICD-10-CM | POA: Diagnosis present

## 2014-09-15 DIAGNOSIS — F419 Anxiety disorder, unspecified: Secondary | ICD-10-CM | POA: Insufficient documentation

## 2014-09-15 DIAGNOSIS — R079 Chest pain, unspecified: Secondary | ICD-10-CM | POA: Diagnosis not present

## 2014-09-15 DIAGNOSIS — E1159 Type 2 diabetes mellitus with other circulatory complications: Secondary | ICD-10-CM | POA: Diagnosis present

## 2014-09-15 DIAGNOSIS — I152 Hypertension secondary to endocrine disorders: Secondary | ICD-10-CM | POA: Diagnosis present

## 2014-09-15 DIAGNOSIS — R05 Cough: Secondary | ICD-10-CM | POA: Diagnosis not present

## 2014-09-15 DIAGNOSIS — N281 Cyst of kidney, acquired: Secondary | ICD-10-CM | POA: Diagnosis not present

## 2014-09-15 DIAGNOSIS — R0600 Dyspnea, unspecified: Secondary | ICD-10-CM | POA: Diagnosis present

## 2014-09-15 DIAGNOSIS — N289 Disorder of kidney and ureter, unspecified: Secondary | ICD-10-CM

## 2014-09-15 DIAGNOSIS — Z7982 Long term (current) use of aspirin: Secondary | ICD-10-CM | POA: Insufficient documentation

## 2014-09-15 DIAGNOSIS — M1990 Unspecified osteoarthritis, unspecified site: Secondary | ICD-10-CM | POA: Diagnosis not present

## 2014-09-15 NOTE — ED Notes (Signed)
Per EMS - pt from home w/ c/o chest pain that radiates to her back w/ associated shortness of breath. Pt noted to have mild stridor on arrival, speaking phrases. Denies recent illness, cough or fever.

## 2014-09-16 ENCOUNTER — Observation Stay: Payer: Commercial Managed Care - HMO

## 2014-09-16 ENCOUNTER — Emergency Department: Payer: Commercial Managed Care - HMO

## 2014-09-16 ENCOUNTER — Observation Stay (HOSPITAL_BASED_OUTPATIENT_CLINIC_OR_DEPARTMENT_OTHER)
Admit: 2014-09-16 | Discharge: 2014-09-16 | Disposition: A | Discharge status: Home / Self Care | Payer: Commercial Managed Care - HMO | Attending: Internal Medicine | Admitting: Internal Medicine

## 2014-09-16 DIAGNOSIS — F17210 Nicotine dependence, cigarettes, uncomplicated: Secondary | ICD-10-CM | POA: Diagnosis not present

## 2014-09-16 DIAGNOSIS — R079 Chest pain, unspecified: Secondary | ICD-10-CM | POA: Diagnosis present

## 2014-09-16 DIAGNOSIS — I517 Cardiomegaly: Secondary | ICD-10-CM | POA: Diagnosis not present

## 2014-09-16 DIAGNOSIS — I129 Hypertensive chronic kidney disease with stage 1 through stage 4 chronic kidney disease, or unspecified chronic kidney disease: Secondary | ICD-10-CM | POA: Diagnosis not present

## 2014-09-16 DIAGNOSIS — I10 Essential (primary) hypertension: Secondary | ICD-10-CM | POA: Diagnosis not present

## 2014-09-16 DIAGNOSIS — N2889 Other specified disorders of kidney and ureter: Secondary | ICD-10-CM | POA: Diagnosis not present

## 2014-09-16 DIAGNOSIS — J9601 Acute respiratory failure with hypoxia: Secondary | ICD-10-CM | POA: Diagnosis present

## 2014-09-16 DIAGNOSIS — J441 Chronic obstructive pulmonary disease with (acute) exacerbation: Secondary | ICD-10-CM | POA: Diagnosis not present

## 2014-09-16 DIAGNOSIS — N281 Cyst of kidney, acquired: Secondary | ICD-10-CM | POA: Diagnosis not present

## 2014-09-16 DIAGNOSIS — N179 Acute kidney failure, unspecified: Secondary | ICD-10-CM | POA: Diagnosis not present

## 2014-09-16 DIAGNOSIS — N183 Chronic kidney disease, stage 3 (moderate): Secondary | ICD-10-CM | POA: Diagnosis not present

## 2014-09-16 HISTORY — DX: Acute respiratory failure with hypoxia: J96.01

## 2014-09-16 LAB — COMPREHENSIVE METABOLIC PANEL
ALBUMIN: 3.5 g/dL (ref 3.5–5.0)
ALK PHOS: 69 U/L (ref 38–126)
ALT: 13 U/L — ABNORMAL LOW (ref 14–54)
AST: 22 U/L (ref 15–41)
Anion gap: 8 (ref 5–15)
BUN: 20 mg/dL (ref 6–20)
CO2: 26 mmol/L (ref 22–32)
CREATININE: 1.53 mg/dL — AB (ref 0.44–1.00)
Calcium: 8.9 mg/dL (ref 8.9–10.3)
Chloride: 105 mmol/L (ref 101–111)
GFR calc non Af Amer: 33 mL/min — ABNORMAL LOW (ref >60)
GFR, EST AFRICAN AMERICAN: 38 mL/min — AB (ref >60)
GLUCOSE: 98 mg/dL (ref 65–99)
Potassium: 4.9 mmol/L (ref 3.5–5.1)
SODIUM: 139 mmol/L (ref 135–145)
Total Bilirubin: 0.7 mg/dL (ref 0.3–1.2)
Total Protein: 7.3 g/dL (ref 6.5–8.1)

## 2014-09-16 LAB — BLOOD GAS, ARTERIAL
ACID-BASE EXCESS: 3.5 mmol/L — AB (ref 0.0–3.0)
Allens test (pass/fail): POSITIVE — AB
Bicarbonate: 27.8 mEq/L (ref 21.0–28.0)
FIO2: 0.24
O2 Saturation: 91.4 %
PCO2 ART: 40 mmHg (ref 32.0–48.0)
PO2 ART: 59 mmHg — AB (ref 83.0–108.0)
Patient temperature: 37
pH, Arterial: 7.45 (ref 7.350–7.450)

## 2014-09-16 LAB — CBC
HCT: 39.5 % (ref 35.0–47.0)
Hemoglobin: 13.1 g/dL (ref 12.0–16.0)
MCH: 31.1 pg (ref 26.0–34.0)
MCHC: 33.2 g/dL (ref 32.0–36.0)
MCV: 93.6 fL (ref 80.0–100.0)
Platelets: 214 10*3/uL (ref 150–440)
RBC: 4.22 MIL/uL (ref 3.80–5.20)
RDW: 14.7 % — ABNORMAL HIGH (ref 11.5–14.5)
WBC: 7.7 10*3/uL (ref 3.6–11.0)

## 2014-09-16 LAB — BRAIN NATRIURETIC PEPTIDE: B NATRIURETIC PEPTIDE 5: 44 pg/mL (ref 0.0–100.0)

## 2014-09-16 LAB — TROPONIN I
Troponin I: <0.03 ng/mL (ref <0.031)
Troponin I: <0.03 ng/mL (ref <0.031)
Troponin I: <0.03 ng/mL (ref <0.031)
Troponin I: <0.03 ng/mL (ref <0.031)

## 2014-09-16 MED ORDER — HYDRALAZINE HCL 25 MG PO TABS
25.0000 mg | ORAL_TABLET | Freq: Two times a day (BID) | ORAL | Status: DC
  Administered 2014-09-16 – 2014-09-19 (×7): 25 mg via ORAL
  Filled 2014-09-16 (×7): qty 1

## 2014-09-16 MED ORDER — TRAMADOL HCL 50 MG PO TABS
50.0000 mg | ORAL_TABLET | Freq: Once | ORAL | Status: AC
  Administered 2014-09-16: 50 mg via ORAL
  Filled 2014-09-16: qty 1

## 2014-09-16 MED ORDER — ASPIRIN EC 81 MG PO TBEC
81.0000 mg | DELAYED_RELEASE_TABLET | Freq: Every day | ORAL | Status: DC
  Administered 2014-09-17 – 2014-09-19 (×3): 81 mg via ORAL
  Filled 2014-09-16 (×3): qty 1

## 2014-09-16 MED ORDER — NITROGLYCERIN 0.4 MG SL SUBL
0.4000 mg | SUBLINGUAL_TABLET | SUBLINGUAL | Status: DC | PRN
  Administered 2014-09-16 (×2): 0.4 mg via SUBLINGUAL
  Filled 2014-09-16: qty 1

## 2014-09-16 MED ORDER — FLUOXETINE HCL 10 MG PO CAPS
10.0000 mg | ORAL_CAPSULE | Freq: Every day | ORAL | Status: DC
  Administered 2014-09-16 – 2014-09-19 (×3): 10 mg via ORAL
  Filled 2014-09-16 (×6): qty 1

## 2014-09-16 MED ORDER — IPRATROPIUM-ALBUTEROL 0.5-2.5 (3) MG/3ML IN SOLN
3.0000 mL | RESPIRATORY_TRACT | Status: DC
  Administered 2014-09-16 – 2014-09-17 (×7): 3 mL via RESPIRATORY_TRACT
  Filled 2014-09-16 (×7): qty 3

## 2014-09-16 MED ORDER — FUROSEMIDE 10 MG/ML IJ SOLN: 20.0000 mg | Freq: Once | INTRAMUSCULAR | Status: DC

## 2014-09-16 MED ORDER — METHYLPREDNISOLONE SODIUM SUCC 125 MG IJ SOLR
60.0000 mg | Freq: Four times a day (QID) | INTRAMUSCULAR | Status: DC
Start: 2014-09-16 — End: 2014-09-17
  Administered 2014-09-16 – 2014-09-17 (×4): 60 mg via INTRAVENOUS
  Filled 2014-09-16 (×5): qty 2

## 2014-09-16 MED ORDER — PANTOPRAZOLE SODIUM 40 MG PO TBEC
40.0000 mg | DELAYED_RELEASE_TABLET | Freq: Every day | ORAL | Status: DC
  Administered 2014-09-17 – 2014-09-19 (×3): 40 mg via ORAL
  Filled 2014-09-16 (×3): qty 1

## 2014-09-16 MED ORDER — TRAMADOL HCL 50 MG PO TABS: 50.0000 mg | ORAL_TABLET | Freq: Four times a day (QID) | ORAL | Status: DC | PRN

## 2014-09-16 MED ORDER — LORATADINE 10 MG PO TABS
10.0000 mg | ORAL_TABLET | Freq: Every day | ORAL | Status: DC
  Administered 2014-09-17 – 2014-09-19 (×3): 10 mg via ORAL
  Filled 2014-09-16 (×3): qty 1

## 2014-09-16 MED ORDER — ASPIRIN EC 325 MG PO TBEC
325.0000 mg | DELAYED_RELEASE_TABLET | Freq: Every day | ORAL | Status: DC
  Administered 2014-09-16: 325 mg via ORAL
  Filled 2014-09-16: qty 1

## 2014-09-16 MED ORDER — ACETAMINOPHEN 325 MG PO TABS
650.0000 mg | ORAL_TABLET | ORAL | Status: DC | PRN
  Administered 2014-09-17: 650 mg via ORAL
  Filled 2014-09-16: qty 2

## 2014-09-16 MED ORDER — METOPROLOL TARTRATE 25 MG PO TABS
12.5000 mg | ORAL_TABLET | Freq: Two times a day (BID) | ORAL | Status: DC
  Administered 2014-09-16 – 2014-09-19 (×6): 12.5 mg via ORAL
  Filled 2014-09-16 (×7): qty 1

## 2014-09-16 MED ORDER — IPRATROPIUM-ALBUTEROL 0.5-2.5 (3) MG/3ML IN SOLN
3.0000 mL | Freq: Once | RESPIRATORY_TRACT | Status: DC
  Filled 2014-09-16: qty 3

## 2014-09-16 MED ORDER — ONDANSETRON HCL 4 MG/2ML IJ SOLN: 4.0000 mg | Freq: Four times a day (QID) | INTRAMUSCULAR | Status: DC | PRN

## 2014-09-16 MED ORDER — METHYLPREDNISOLONE SODIUM SUCC 125 MG IJ SOLR
125.0000 mg | Freq: Once | INTRAMUSCULAR | Status: AC
  Administered 2014-09-16: 125 mg via INTRAVENOUS

## 2014-09-16 MED ORDER — IOHEXOL 350 MG/ML SOLN
80.0000 mL | Freq: Once | INTRAVENOUS | Status: AC | PRN
  Administered 2014-09-16: 80 mL via INTRAVENOUS

## 2014-09-16 MED ORDER — HYDRALAZINE HCL 20 MG/ML IJ SOLN: 10.0000 mg | Freq: Four times a day (QID) | INTRAMUSCULAR | Status: DC | PRN

## 2014-09-16 MED ORDER — FLUOXETINE HCL 20 MG PO TABS
10.0000 mg | ORAL_TABLET | Freq: Every day | ORAL | Status: DC
  Filled 2014-09-16: qty 1

## 2014-09-16 MED ORDER — GI COCKTAIL ~~LOC~~
30.0000 mL | Freq: Once | ORAL | Status: AC
  Administered 2014-09-16: 30 mL via ORAL
  Filled 2014-09-16: qty 30

## 2014-09-16 MED ORDER — HEPARIN SODIUM (PORCINE) 5000 UNIT/ML IJ SOLN
5000.0000 [IU] | Freq: Three times a day (TID) | INTRAMUSCULAR | Status: DC
  Administered 2014-09-16 – 2014-09-19 (×9): 5000 [IU] via SUBCUTANEOUS
  Filled 2014-09-16 (×9): qty 1

## 2014-09-16 MED ORDER — TRIAMCINOLONE ACETONIDE 0.1 % EX CREA
1.0000 "application " | TOPICAL_CREAM | Freq: Two times a day (BID) | CUTANEOUS | Status: DC | PRN
  Filled 2014-09-16: qty 15

## 2014-09-16 NOTE — Progress Notes (Signed)
Patient very dyspneic.  Ambulated to [facility] with 1assist.  Expiratory wheezing noted.  BP slightly elevated, HR NSR, no complaints of pain.  Enzymes negative x1.

## 2014-09-16 NOTE — ED Provider Notes (Signed)
The plan was initially to discharge the patient to home as she did have some improvement in her pain but when I did go back to reassess the patient she reported that the pain was getting worse again she does not understand why she had it. The decision point was made to do a CT scan of the patient's chest to evaluate for PE.  CT chest: Good air for acute pulmonary embolism, scattered linear atelectatic changes or scar in both lung bases.  Since the CT was negative the plan again was to discharge the patient to home but when the patient got up to use the bathroom and get back into the bed her oxygen saturation dropped into the 80s she became tachycardic and tachypneic acutely. Given the patient's hypoxia and dyspnea on exertion I will add on a BNP as well as an ABG and admit the patient to the hospitalist service. I will give the patient a DuoNeb although she is not wheezing to see if that does help with some of her breathing difficulty. I discussed the patient's case with the hospitalist physician.  Loney Hering, MD 09/16/14 [phone]

## 2014-09-16 NOTE — ED Provider Notes (Signed)
[facility] Emergency Department Provider Note  ____________________________________________  Time seen: Approximately 2349 AM  I have reviewed the triage vital signs and the nursing notes.   HISTORY  Chief Complaint Shortness of Breath and Chest Pain    HPI Katherine [name] is a 74 y.o. female who comes in today with chest pain. The patient reports the pain started when she came home from church. The patient reports that she has a cough and the pain is worse when she coughs. The patient reports that she has a sharp pain in the middle of her chest. The patient reports that she has never had pain like this before. The patient denies any fever but has had some vomiting and has some feeling of shortness of breath. The patient reports the pain as a 10 out of 10. She denies any sweats but says she also has some pain in her back as well. The patient has some leg swelling.The patient reports that she was unable to tolerate the pain so she called the ambulance to bring her into the hospital.   Past Medical History  Diagnosis Date  . Anxiety and depression   . Tobacco abuse   . Angioedema   . Gallstones   . Prurigo nodularis   . Anxiety   . Hypertension   . GERD (gastroesophageal reflux disease)   . COPD (chronic obstructive pulmonary disease)   . Osteoarthritis   . Vitamin D deficiency disease   . Chronic constipation   . Chronic kidney disease     stage 3  . Left ventricular hypertrophy     Patient Active Problem List   Diagnosis Date Noted  . 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 Anxiety   . Tobacco abuse   . Prurigo nodularis   . Angioedema   . Hypertensive heart disease   . GERD (gastroesophageal reflux disease)   . COPD (chronic obstructive pulmonary disease)   . Osteoarthritis   . Vitamin D deficiency disease   . Chronic constipation   . Chronic kidney disease   . SOB (shortness of breath) 05/20/2013  . Right foot pain 05/20/2013  . LVH (left  ventricular hypertrophy) 05/20/2013  . Essential hypertension 05/20/2013  . Morbid obesity 05/20/2013    Past Surgical History  Procedure Laterality Date  . Cholecystectomy    . Polypectomy  11/2011    vocal cord    Current Outpatient Rx  Name  Route  Sig  Dispense  Refill  . acetaminophen (TYLENOL) 650 MG CR tablet   Oral   Take 650 mg by mouth every 8 (eight) hours as needed for pain.         [name] aspirin 81 MG tablet   Oral   Take 81 mg by mouth daily.         . cetirizine (ZYRTEC) 10 MG tablet   Oral   Take 5 mg by mouth at bedtime.          [name] FLUoxetine (PROZAC) 10 MG tablet   Oral   Take 10 mg by mouth daily.         . hydrALAZINE (APRESOLINE) 25 MG tablet   Oral   Take 1 tablet (25 mg total) by mouth 2 (two) times daily.   60 tablet   5   . omeprazole (PRILOSEC) 40 MG capsule   Oral   Take 40 mg by mouth daily.         [name] triamcinolone cream (KENALOG) 0.1 %   Topical  Apply 1 application topically 2 (two) times daily as needed.   45 g   2   . Vitamin D, Cholecalciferol, 1000 UNITS CAPS   Oral   Take 1 capsule by mouth daily.         . traMADol (ULTRAM) 50 MG tablet   Oral   Take 1 tablet (50 mg total) by mouth every 6 (six) hours as needed.   12 tablet   0     Allergies Enalapril maleate  Family History  Problem Relation Age of Onset  . Alcohol abuse Mother     Social History History  Substance Use Topics  . Smoking status: Current Some Day Smoker -- 0.25 packs/day for 52 years    Types: Cigarettes  . Smokeless tobacco: Never Used  . Alcohol Use: No     Comment: rare    Review of Systems Constitutional: No fever/chills Eyes: No visual changes. ENT: No sore throat. Cardiovascular: chest pain. Respiratory: shortness of breath. Gastrointestinal: Nausea and vomiting with No abdominal pain. No diarrhea.  No constipation. Genitourinary: Negative for dysuria. Musculoskeletal:back pain. Skin: Negative for  rash. Neurological: Negative for headaches, focal weakness or numbness.  10-point ROS otherwise negative.  ____________________________________________   PHYSICAL EXAM:  VITAL SIGNS: ED Triage Vitals  Enc Vitals Group     BP 09/15/14 2350 158/83 mmHg     Pulse Rate 09/15/14 2350 83     Resp 09/15/14 2350 24     Temp 09/15/14 2350 98.6 F (37 C)     Temp Source 09/15/14 2350 Oral     SpO2 09/15/14 2350 97 %     Weight --      Height --      Head Cir --      Peak Flow --      Pain Score 09/15/14 2351 10     Pain Loc --      Pain Edu? --      Excl. in [place]? --     Constitutional: Alert and oriented. Well appearing and in moderate distress. Eyes: Conjunctivae are normal. PERRL. EOMI. Head: Atraumatic. Nose: No congestion/rhinnorhea. Mouth/Throat: Mucous membranes are moist.  Oropharynx non-erythematous. Cardiovascular: Normal rate, regular rhythm. Grossly normal heart sounds.  Good peripheral circulation. Respiratory: Normal respiratory effort.  No retractions. Lungs CTAB. Chest: Mild tenderness to palpation Gastrointestinal: Soft and nontender. No distention. Positive bowel sounds Genitourinary: Deferred Musculoskeletal: No lower extremity tenderness nor edema.  No joint effusions. Neurologic:  Normal speech and language. No gross focal neurologic deficits are appreciated. No gait instability. Skin:  Skin is warm, dry and intact. No rash noted. Psychiatric: Mood and affect are normal.   ____________________________________________   LABS (all labs ordered are listed, but only abnormal results are displayed)  Labs Reviewed  CBC - Abnormal; Notable for the following:    RDW 14.7 (*)    All other components within normal limits  COMPREHENSIVE METABOLIC PANEL - Abnormal; Notable for the following:    Creatinine, Ser 1.53 (*)    ALT 13 (*)    GFR calc non Af Amer 33 (*)    GFR calc Af Amer 38 (*)    All other components within normal limits  TROPONIN I  TROPONIN I    ____________________________________________  EKG  ED ECG REPORT I, Loney Hering, the attending physician, personally viewed and interpreted this ECG.   Date: 09/16/2014  EKG Time: 0011  Rate: 84  Rhythm: normal sinus rhythm  Axis: Normal  Intervals:none  ST&T Change: None  ____________________________________________  RADIOLOGY  Chest x-ray: No acute disease, shallow inspiration ____________________________________________   PROCEDURES  Procedure(s) performed: None  Critical Care performed: No  ____________________________________________   INITIAL IMPRESSION / ASSESSMENT AND PLAN / ED COURSE  Pertinent labs & imaging results that were available during my care of the patient were reviewed by me and considered in my medical decision making (see chart for details).  This is a 74 year old female who comes in with some chest pain that started she reports this evening when she came home from church. The patient reports that she feels some sharp pain in the middle of her chest. She reports that it hurts when she coughs or moves her arms. The patient received aspirin and a spray of nitroglycerin by EMS but I gave the patient 2 more nitroglycerin as well as a GI cocktail and some tramadol. The patient's pain did improve significantly. The patient does have some mildly elevated creatinine but that is at her baseline. The patient does not have any pneumonia or pneumothorax on chest x-ray and she had 2 sets of troponins that were negative. Patient be discharged home to follow-up with cardiology for further evaluation and testing. I discussed the patient and she agrees with the plan. ____________________________________________   FINAL CLINICAL IMPRESSION(S) / ED DIAGNOSES  Final diagnoses:  Chest pain, unspecified chest pain type      Loney Hering, MD 09/16/14 [phone]

## 2014-09-16 NOTE — H&P (Signed)
[facility] [place] NAME: Katherine [name]    MR#:  [id number]  DATE OF BIRTH:  11/30/1940  DATE OF ADMISSION:  09/15/2014  PRIMARY CARE PHYSICIAN: Suzie Portela, FNP   REQUESTING/REFERRING PHYSICIAN: Dr Dahlia Client  CHIEF COMPLAINT:   Chief Complaint  Patient presents with  . Shortness of Breath  . Chest Pain    HISTORY OF PRESENT ILLNESS:  Katherine [name] is a 74 y.o. female with known history of hypertension who comes in today with chest pain. The patient reports the pain started when she came home from church last night around 9 PM.  She has a cough and the pain is worse when she coughs, pain is sharp in the mid epigastric area . She has never had pain like this before. No fever but has had some vomiting and has some feeling of shortness of breath. Pain as a 10 out of 10, no sweats but says she also has some pain in her back as well as leg swelling.The patient reports that she was unable to tolerate the pain so she called the ambulance to bring her into the hospital. Her initial emergency room workup including CT angiogram of the chest was negative. She was about to be discharged to home when she had an acute hypoxic event with oxygen saturation in the 80s accompanied by tachycardia. She is admitted for acute respiratory failure with hypoxia.  PAST MEDICAL HISTORY:   Past Medical History  Diagnosis Date  . Anxiety and depression   . Tobacco abuse   . Angioedema   . Gallstones   . Prurigo nodularis   . Anxiety   . Hypertension   . GERD (gastroesophageal reflux disease)   . COPD (chronic obstructive pulmonary disease)   . Osteoarthritis   . Vitamin D deficiency disease   . Chronic constipation   . Chronic kidney disease     stage 3  . Left ventricular hypertrophy     PAST SURGICAL HISTORY:   Past Surgical History  Procedure Laterality Date  . Cholecystectomy    . Polypectomy  11/2011    vocal cord    SOCIAL  HISTORY:   History  Substance Use Topics  . Smoking status: Current Some Day Smoker -- 0.25 packs/day for 52 years    Types: Cigarettes  . Smokeless tobacco: Never Used  . Alcohol Use: No     Comment: rare    FAMILY HISTORY:   Family History  Problem Relation Age of Onset  . Alcohol abuse Mother     DRUG ALLERGIES:   Allergies  Allergen Reactions  . Enalapril Maleate     Angioedema     REVIEW OF SYSTEMS:   Review of Systems  Constitutional: Negative for fever, chills, weight loss and malaise/fatigue.  HENT: Negative for congestion and hearing loss.   Eyes: Negative for blurred vision and pain.  Respiratory: Positive for cough, sputum production, shortness of breath and wheezing. Negative for hemoptysis and stridor.   Cardiovascular: Positive for chest pain. Negative for palpitations, orthopnea and leg swelling.  Gastrointestinal: Positive for nausea. Negative for vomiting, abdominal pain, diarrhea, constipation and blood in stool.  Genitourinary: Negative for dysuria and frequency.  Musculoskeletal: Negative for myalgias, back pain, joint pain and neck pain.  Skin: Negative for rash.  Neurological: Positive for weakness. Negative for focal weakness, loss of consciousness and headaches.  Endo/Heme/Allergies: Does not bruise/bleed easily.  Psychiatric/Behavioral: Negative for depression and hallucinations. The patient is not  nervous/anxious.     MEDICATIONS AT HOME:   Prior to Admission medications   Medication Sig Start Date End Date Taking? Authorizing Provider  acetaminophen (TYLENOL) 650 MG CR tablet Take 650 mg by mouth every 8 (eight) hours as needed for pain.   Yes Historical Provider, MD  aspirin 81 MG tablet Take 81 mg by mouth daily.   Yes Historical Provider, MD  cetirizine (ZYRTEC) 10 MG tablet Take 5 mg by mouth at bedtime.    Yes Historical Provider, MD  FLUoxetine (PROZAC) 10 MG tablet Take 10 mg by mouth daily.   Yes Historical Provider, MD   hydrALAZINE (APRESOLINE) 25 MG tablet Take 1 tablet (25 mg total) by mouth 2 (two) times daily. 08/31/14  Yes Arnetha Courser, MD  omeprazole (PRILOSEC) 40 MG capsule Take 40 mg by mouth daily.   Yes Historical Provider, MD  triamcinolone cream (KENALOG) 0.1 % Apply 1 application topically 2 (two) times daily as needed. 08/12/14  Yes Arnetha Courser, MD  Vitamin D, Cholecalciferol, 1000 UNITS CAPS Take 1 capsule by mouth daily.   Yes Historical Provider, MD  traMADol (ULTRAM) 50 MG tablet Take 1 tablet (50 mg total) by mouth every 6 (six) hours as needed. 09/16/14   Loney Hering, MD      VITAL SIGNS:  Blood pressure 161/89, pulse 81, temperature 98.3 F (36.8 C), temperature source Oral, resp. rate 20, height 5\' 6"  (1.676 m), weight 106.55 kg (234 lb 14.4 oz), SpO2 98 %.  PHYSICAL EXAMINATION:  GENERAL:  74 y.o.-year-old patient lying in the bed, uncomfortable, obese EYES: Pupils equal, round, reactive to light and accommodation. No scleral icterus. Extraocular muscles intact.  HEENT: Head atraumatic, normocephalic. Oropharynx and nasopharynx clear.  NECK:  Supple, no jugular venous distention. No thyroid enlargement, no tenderness.  LUNGS: Scattered wheezes, rapid shallow respirations, no rhonchi or rales, fair air movement CARDIOVASCULAR: Tachycardic, S1, S2 normal. No murmurs, rubs, or gallops.  ABDOMEN: Soft, nontender, nondistended. Bowel sounds present. No organomegaly or mass.  EXTREMITIES: No pedal edema, cyanosis, or clubbing.  NEUROLOGIC: Cranial nerves II through XII are intact. Muscle strength 5/5 in all extremities. Sensation intact. Gait not checked.  PSYCHIATRIC: The patient is alert and oriented x 3.  SKIN: No obvious rash, lesion, or ulcer.   LABORATORY PANEL:   CBC  Recent Labs Lab 09/16/14 0020  WBC 7.7  HGB 13.1  HCT 39.5  PLT 214    ------------------------------------------------------------------------------------------------------------------  Chemistries   Recent Labs Lab 09/16/14 0020  NA 139  K 4.9  CL 105  CO2 26  GLUCOSE 98  BUN 20  CREATININE 1.53*  CALCIUM 8.9  AST 22  ALT 13*  ALKPHOS 69  BILITOT 0.7   ------------------------------------------------------------------------------------------------------------------  Cardiac Enzymes  Recent Labs Lab 09/16/14 0836  TROPONINI <0.03   ------------------------------------------------------------------------------------------------------------------  RADIOLOGY:  Ct Angio Chest Pe W/cm &/or Wo Cm  09/16/2014   CLINICAL DATA:  Chest pain  EXAM: CT ANGIOGRAPHY CHEST WITH CONTRAST  TECHNIQUE: Multidetector CT imaging of the chest was performed using the standard protocol during bolus administration of intravenous contrast. Multiplanar CT image reconstructions and MIPs were obtained to evaluate the vascular anatomy.  CONTRAST:  97mL OMNIPAQUE IOHEXOL 350 MG/ML SOLN  COMPARISON:  None.  FINDINGS: Cardiovascular: There is good opacification of the pulmonary arteries. There is no pulmonary embolism. The thoracic aorta is normal in caliber and intact.  Lungs: There is scattered linear scarring or atelectasis. No confluent airspace consolidation.  Central airways: Patent  Effusions: None  Lymphadenopathy: None  Esophagus: Unremarkable  Upper abdomen: There is an indeterminate 12 mm focal lesion at the superior pole of the left kidney. Hiatal hernia. Otherwise unremarkable.  Musculoskeletal: No significant abnormality.  Review of the MIP images confirms the above findings.  IMPRESSION: 1. Negative for acute pulmonary embolism 2. Scattered linear atelectatic changes or scar in both lung bases 3. Indeterminate 12 mm left renal lesion, superior pole. Consider ultrasonography to characterize.   Electronically Signed   By: Andreas Newport M.D.   On: 09/16/2014 07:01    Dg Chest Portable 1 View  09/16/2014   CLINICAL DATA:  Midchest pain radiating to the back since 21:00. Dyspnea.  EXAM: PORTABLE CHEST - 1 VIEW  COMPARISON:  02/20/2013  FINDINGS: There is mild crowding of basilar markings which is associated with a shallow inspiration. The lungs are otherwise clear. There is no large effusion. Hilar and mediastinal contours are unremarkable.  IMPRESSION: Shallow inspiration   Electronically Signed   By: Andreas Newport M.D.   On: 09/16/2014 00:54    EKG:   Orders placed or performed during the hospital encounter of 09/15/14  . ED EKG  . ED EKG  . EKG 12-Lead  . EKG 12-Lead    IMPRESSION AND PLAN:   #1 acute respiratory failure with hypoxia - CT chest negative for PE or pneumonia, no edema this is likely a COPD exacerbation - Currently doing well on 2 L via nasal cannula with saturations in the upper 90s  #2 COPD exacerbation - Current half pack per day smoker, smoking cessation counseling provided - Start Solu-Medrol, nebulizers, azithromycin  #3 hypertension - Hypertensive on presentation, has history of LVH likely due to chronic uncontrolled hypertension - Continue hydralazine, may need additional blood pressure treatment  #4 renal cyst/mass - Seen on CT scan, will get renal ultrasound to evaluate further  #5 chest pain - Initial cardiac enzymes are negative, EKG with no signs of ischemia - Monitor on telemetry and cycle cardiac enzymes - Echocardiogram pending - Continue aspirin, statin, start metoprolol  #5 prophylaxis: Continue PPI, heparin for DVT prophylaxis  All the records are reviewed and case discussed with ED provider. Management plans discussed with the patient, family and they are in agreement.  CODE STATUS: Full   TOTAL TIME TAKING CARE OF THIS PATIENT: 45 minutes.  Greater than 50% of time spent in coordination of care and counseling.  Myrtis Ser M.D on 09/16/2014 at 2:48 PM  Between 7am to 6pm - Pager -  [phone]  After 6pm go to [email or web address] - password [email or web address] [facility]  [facility]  Office  [phone]  CC: Primary care physician; Suzie Portela, FNP

## 2014-09-16 NOTE — Progress Notes (Signed)
*  PRELIMINARY RESULTS* Echocardiogram 2D Echocardiogram has been performed.  Katherine [name] 09/16/2014, 9:59 AM

## 2014-09-17 DIAGNOSIS — I10 Essential (primary) hypertension: Secondary | ICD-10-CM | POA: Diagnosis not present

## 2014-09-17 DIAGNOSIS — I517 Cardiomegaly: Secondary | ICD-10-CM | POA: Diagnosis not present

## 2014-09-17 DIAGNOSIS — N179 Acute kidney failure, unspecified: Secondary | ICD-10-CM | POA: Diagnosis not present

## 2014-09-17 DIAGNOSIS — I129 Hypertensive chronic kidney disease with stage 1 through stage 4 chronic kidney disease, or unspecified chronic kidney disease: Secondary | ICD-10-CM | POA: Diagnosis not present

## 2014-09-17 DIAGNOSIS — J441 Chronic obstructive pulmonary disease with (acute) exacerbation: Secondary | ICD-10-CM | POA: Diagnosis not present

## 2014-09-17 DIAGNOSIS — J9601 Acute respiratory failure with hypoxia: Secondary | ICD-10-CM | POA: Diagnosis not present

## 2014-09-17 DIAGNOSIS — F17210 Nicotine dependence, cigarettes, uncomplicated: Secondary | ICD-10-CM | POA: Diagnosis not present

## 2014-09-17 DIAGNOSIS — R079 Chest pain, unspecified: Secondary | ICD-10-CM | POA: Diagnosis not present

## 2014-09-17 DIAGNOSIS — N281 Cyst of kidney, acquired: Secondary | ICD-10-CM | POA: Diagnosis not present

## 2014-09-17 DIAGNOSIS — N183 Chronic kidney disease, stage 3 (moderate): Secondary | ICD-10-CM | POA: Diagnosis not present

## 2014-09-17 LAB — CBC
HCT: 38.6 % (ref 35.0–47.0)
Hemoglobin: 12.8 g/dL (ref 12.0–16.0)
MCH: 31.1 pg (ref 26.0–34.0)
MCHC: 33.2 g/dL (ref 32.0–36.0)
MCV: 93.8 fL (ref 80.0–100.0)
PLATELETS: 209 10*3/uL (ref 150–440)
RBC: 4.11 MIL/uL (ref 3.80–5.20)
RDW: 14.4 % (ref 11.5–14.5)
WBC: 8.5 10*3/uL (ref 3.6–11.0)

## 2014-09-17 LAB — BASIC METABOLIC PANEL
Anion gap: 8 (ref 5–15)
BUN: 28 mg/dL — ABNORMAL HIGH (ref 6–20)
CHLORIDE: 104 mmol/L (ref 101–111)
CO2: 24 mmol/L (ref 22–32)
Calcium: 9 mg/dL (ref 8.9–10.3)
Creatinine, Ser: 1.48 mg/dL — ABNORMAL HIGH (ref 0.44–1.00)
GFR calc Af Amer: 40 mL/min — ABNORMAL LOW (ref >60)
GFR, EST NON AFRICAN AMERICAN: 34 mL/min — AB (ref >60)
Glucose, Bld: 177 mg/dL — ABNORMAL HIGH (ref 65–99)
Potassium: 4.7 mmol/L (ref 3.5–5.1)
SODIUM: 136 mmol/L (ref 135–145)

## 2014-09-17 MED ORDER — BENZONATATE 100 MG PO CAPS
100.0000 mg | ORAL_CAPSULE | Freq: Three times a day (TID) | ORAL | Status: DC | PRN
  Administered 2014-09-17: 100 mg via ORAL
  Filled 2014-09-17: qty 1

## 2014-09-17 MED ORDER — PREDNISONE 50 MG PO TABS
50.0000 mg | ORAL_TABLET | Freq: Every day | ORAL | Status: DC
  Administered 2014-09-18 – 2014-09-19 (×2): 50 mg via ORAL
  Filled 2014-09-17 (×2): qty 1

## 2014-09-17 MED ORDER — ALBUTEROL SULFATE (2.5 MG/3ML) 0.083% IN NEBU: 2.5000 mg | INHALATION_SOLUTION | RESPIRATORY_TRACT | Status: DC | PRN

## 2014-09-17 MED ORDER — IPRATROPIUM-ALBUTEROL 0.5-2.5 (3) MG/3ML IN SOLN: 3.0000 mL | Freq: Four times a day (QID) | RESPIRATORY_TRACT | Status: DC

## 2014-09-17 NOTE — Progress Notes (Signed)
Patient remained on 2L acute oxygen overnight. Patient stated that she could breath better but still experience DOE when transferring from and to [facility]. Patient denied pain, N&V, was sinus arrhthymias on the monitor without any symptoms. VS remained WDL for patient. Patient was assisted as needed and offered bathroom with safety rounding. Will continue to monitor .Will continue to monitor .

## 2014-09-17 NOTE — Progress Notes (Signed)
[place] at [place] NAME: Katherine [name]    MR#:  [id number]  DATE OF BIRTH:  Dec 13, 1940  SUBJECTIVE:  CHIEF COMPLAINT:   Chief Complaint  Patient presents with  . Shortness of Breath  . Chest Pain   Feeling somewhat better this morning. No chest pain. Still short of breath even at rest. Oxygen saturations have been good today, she did require 2 L overnight  REVIEW OF SYSTEMS:   Review of Systems  Constitutional: Negative for fever.  Respiratory: Positive for cough, sputum production, shortness of breath and wheezing.   Cardiovascular: Negative for chest pain and palpitations.  Gastrointestinal: Negative for nausea, vomiting and abdominal pain.  Genitourinary: Negative for dysuria.    DRUG ALLERGIES:   Allergies  Allergen Reactions  . Enalapril Maleate     Angioedema     VITALS:  Blood pressure 128/73, pulse 44, temperature 98.2 F (36.8 C), temperature source Oral, resp. rate 20, height 5\' 6"  (1.676 m), weight 105.28 kg (232 lb 1.6 oz), SpO2 91 %.  PHYSICAL EXAMINATION:  GENERAL:  74 y.o.-year-old patient lying in the bed short of breath uncomfortable  LUNGS: Short shallow respirations, scattered wheezes, fair air movement, no rhonchi or rales CARDIOVASCULAR: S1, S2 normal. No murmurs, rubs, or gallops. Tachycardic ABDOMEN: Soft, nontender, nondistended. Bowel sounds present. No organomegaly or mass.  EXTREMITIES: No pedal edema, cyanosis, or clubbing.  NEUROLOGIC: Cranial nerves II through XII are intact. Muscle strength 5/5 in all extremities. Sensation intact. Gait not checked.  PSYCHIATRIC: The patient is alert and oriented x 3.  SKIN: No obvious rash, lesion, or ulcer.    LABORATORY PANEL:   CBC  Recent Labs Lab 09/17/14 0426  WBC 8.5  HGB 12.8  HCT 38.6  PLT 209   ------------------------------------------------------------------------------------------------------------------  Chemistries    Recent Labs Lab 09/16/14 0020 09/17/14 0426  NA 139 136  K 4.9 4.7  CL 105 104  CO2 26 24  GLUCOSE 98 177*  BUN 20 28*  CREATININE 1.53* 1.48*  CALCIUM 8.9 9.0  AST 22  --   ALT 13*  --   ALKPHOS 69  --   BILITOT 0.7  --    ------------------------------------------------------------------------------------------------------------------  Cardiac Enzymes  Recent Labs Lab 09/16/14 2019  TROPONINI <0.03   ------------------------------------------------------------------------------------------------------------------  RADIOLOGY:  Ct Angio Chest Pe W/cm &/or Wo Cm  09/16/2014   CLINICAL DATA:  Chest pain  EXAM: CT ANGIOGRAPHY CHEST WITH CONTRAST  TECHNIQUE: Multidetector CT imaging of the chest was performed using the standard protocol during bolus administration of intravenous contrast. Multiplanar CT image reconstructions and MIPs were obtained to evaluate the vascular anatomy.  CONTRAST:  38mL OMNIPAQUE IOHEXOL 350 MG/ML SOLN  COMPARISON:  None.  FINDINGS: Cardiovascular: There is good opacification of the pulmonary arteries. There is no pulmonary embolism. The thoracic aorta is normal in caliber and intact.  Lungs: There is scattered linear scarring or atelectasis. No confluent airspace consolidation.  Central airways: Patent  Effusions: None  Lymphadenopathy: None  Esophagus: Unremarkable  Upper abdomen: There is an indeterminate 12 mm focal lesion at the superior pole of the left kidney. Hiatal hernia. Otherwise unremarkable.  Musculoskeletal: No significant abnormality.  Review of the MIP images confirms the above findings.  IMPRESSION: 1. Negative for acute pulmonary embolism 2. Scattered linear atelectatic changes or scar in both lung bases 3. Indeterminate 12 mm left renal lesion, superior pole. Consider ultrasonography to characterize.   Electronically Signed   By: Shaune Pascal  Alroy Dust M.D.   On: 09/16/2014 07:01   US Renal  09/16/2014   CLINICAL DATA:  Renal lesion  EXAM:  RENAL / URINARY TRACT ULTRASOUND COMPLETE  COMPARISON:  CT 09/16/2014  FINDINGS: Right Kidney:  Length: 10.1 cm. Echogenicity within normal limits. No mass or hydronephrosis visualized.  Left Kidney:  Length: 10.4 cm. 12 mm cyst left upper pole corresponds to the low-density lesion on CT. Echogenicity within normal limits. No mass or hydronephrosis visualized.  Bladder:  Negative  IMPRESSION: 12 mm left upper pole cyst corresponds to the CT abnormality. No mass or renal obstruction identified.   Electronically Signed   By: Franchot Gallo M.D.   On: 09/16/2014 16:08   Dg Chest Portable 1 View  09/16/2014   CLINICAL DATA:  Midchest pain radiating to the back since 21:00. Dyspnea.  EXAM: PORTABLE CHEST - 1 VIEW  COMPARISON:  02/20/2013  FINDINGS: There is mild crowding of basilar markings which is associated with a shallow inspiration. The lungs are otherwise clear. There is no large effusion. Hilar and mediastinal contours are unremarkable.  IMPRESSION: Shallow inspiration   Electronically Signed   By: Andreas Newport M.D.   On: 09/16/2014 00:54    EKG:   Orders placed or performed during the hospital encounter of 09/15/14  . ED EKG  . ED EKG  . EKG 12-Lead  . EKG 12-Lead    ASSESSMENT AND PLAN:    #1 acute respiratory failure with hypoxia: Improving - CT chest negative for PE or pneumonia, no edema this is likely a COPD exacerbation - Maintain oxygen sats overnight on 2 L, continue to titrate down today  #2 COPD exacerbation: Improving - Current half pack per day smoker, smoking cessation counseling provided - Continue Solu-Medrol, nebulizers, azithromycin  #3 hypertension: - Hypertensive on presentation, has history of LVH likely due to chronic uncontrolled hypertension - Have started metoprolol 12.5 twice a day which seems to be helping, continue hydralazine and she was on at home  #4 renal cyst/mass - Renal ultrasound shows simple cyst  #5 chest pain: Has ruled out for ACS -  Initial cardiac enzymes are negative, EKG with no signs of ischemia - Monitor on telemetry and cycle cardiac enzymes - Echocardiogram shows preserved ejection fraction, essentially normal - Continue aspirin, statin, start metoprolol  #5 prophylaxis: Continue PPI, heparin for DVT prophylaxis  CODE STATUS: Full   TOTAL TIME TAKING CARE OF THIS PATIENT: 30 minutes.  Greater than 50% of time spent in care coordination and counseling. POSSIBLE D/C IN 1 DAYS, DEPENDING ON CLINICAL CONDITION.   Myrtis Ser M.D on 09/17/2014 at 2:57 PM  Between 7am to 6pm - Pager - [phone]  After 6pm go to [email or web address] - password [email or web address] [facility]  [place] [facility]  Office  [phone]  CC: Primary care physician; Suzie Portela, FNP

## 2014-09-17 NOTE — Care Management (Signed)
Patient attempted to assess patient but she requests CM return at a later time as she is waiting to finished watching her soap operas.  Patient does not have home 02 and attending states patient may qualify for home 02 for COPD. Discussed the need to determine if patient qualifies for home 02.  Patient is admitted under observation.  May benefit from home health nursing

## 2014-09-17 NOTE — Care Management (Deleted)
Patient lives with her daughter.  She is able to perform all of her adls.  Her daughter is an [employer] fundraiser.  Confirmed contact information and demographics and PCP

## 2014-09-18 DIAGNOSIS — I517 Cardiomegaly: Secondary | ICD-10-CM | POA: Diagnosis not present

## 2014-09-18 DIAGNOSIS — R079 Chest pain, unspecified: Secondary | ICD-10-CM | POA: Diagnosis not present

## 2014-09-18 DIAGNOSIS — N179 Acute kidney failure, unspecified: Secondary | ICD-10-CM | POA: Diagnosis not present

## 2014-09-18 DIAGNOSIS — J441 Chronic obstructive pulmonary disease with (acute) exacerbation: Secondary | ICD-10-CM | POA: Diagnosis not present

## 2014-09-18 DIAGNOSIS — N183 Chronic kidney disease, stage 3 (moderate): Secondary | ICD-10-CM | POA: Diagnosis not present

## 2014-09-18 DIAGNOSIS — J9601 Acute respiratory failure with hypoxia: Secondary | ICD-10-CM | POA: Diagnosis not present

## 2014-09-18 DIAGNOSIS — I10 Essential (primary) hypertension: Secondary | ICD-10-CM | POA: Diagnosis not present

## 2014-09-18 DIAGNOSIS — N281 Cyst of kidney, acquired: Secondary | ICD-10-CM | POA: Diagnosis not present

## 2014-09-18 DIAGNOSIS — F17210 Nicotine dependence, cigarettes, uncomplicated: Secondary | ICD-10-CM | POA: Diagnosis not present

## 2014-09-18 DIAGNOSIS — I129 Hypertensive chronic kidney disease with stage 1 through stage 4 chronic kidney disease, or unspecified chronic kidney disease: Secondary | ICD-10-CM | POA: Diagnosis not present

## 2014-09-18 LAB — BASIC METABOLIC PANEL
ANION GAP: 5 (ref 5–15)
Anion gap: 7 (ref 5–15)
BUN: 38 mg/dL — ABNORMAL HIGH (ref 6–20)
BUN: 42 mg/dL — ABNORMAL HIGH (ref 6–20)
CHLORIDE: 106 mmol/L (ref 101–111)
CO2: 27 mmol/L (ref 22–32)
CO2: 24 mmol/L (ref 22–32)
CREATININE: 1.81 mg/dL — AB (ref 0.44–1.00)
Calcium: 9.2 mg/dL (ref 8.9–10.3)
Calcium: 8.8 mg/dL — ABNORMAL LOW (ref 8.9–10.3)
Chloride: 107 mmol/L (ref 101–111)
Creatinine, Ser: 1.72 mg/dL — ABNORMAL HIGH (ref 0.44–1.00)
GFR calc Af Amer: 33 mL/min — ABNORMAL LOW (ref >60)
GFR calc Af Amer: 31 mL/min — ABNORMAL LOW (ref >60)
GFR calc non Af Amer: 29 mL/min — ABNORMAL LOW (ref >60)
GFR calc non Af Amer: 27 mL/min — ABNORMAL LOW (ref >60)
GLUCOSE: 117 mg/dL — AB (ref 65–99)
GLUCOSE: 107 mg/dL — AB (ref 65–99)
Potassium: 4.9 mmol/L (ref 3.5–5.1)
Potassium: 4.9 mmol/L (ref 3.5–5.1)
SODIUM: 138 mmol/L (ref 135–145)
SODIUM: 138 mmol/L (ref 135–145)

## 2014-09-18 LAB — CBC
HEMATOCRIT: 38.9 % (ref 35.0–47.0)
Hemoglobin: 13 g/dL (ref 12.0–16.0)
MCH: 31.4 pg (ref 26.0–34.0)
MCHC: 33.4 g/dL (ref 32.0–36.0)
MCV: 93.9 fL (ref 80.0–100.0)
Platelets: 210 10*3/uL (ref 150–440)
RBC: 4.14 MIL/uL (ref 3.80–5.20)
RDW: 14.5 % (ref 11.5–14.5)
WBC: 10.5 10*3/uL (ref 3.6–11.0)

## 2014-09-18 MED ORDER — PREDNISONE 50 MG PO TABS
ORAL_TABLET | ORAL | Status: DC
Start: 2014-09-18 — End: 2015-02-02

## 2014-09-18 MED ORDER — SODIUM CHLORIDE 0.9 % IV SOLN
INTRAVENOUS | Status: AC
  Administered 2014-09-18 – 2014-09-19 (×3): via INTRAVENOUS

## 2014-09-18 MED ORDER — BENZONATATE 100 MG PO CAPS: 100.0000 mg | ORAL_CAPSULE | Freq: Three times a day (TID) | ORAL | Status: DC | PRN

## 2014-09-18 MED ORDER — AZITHROMYCIN 250 MG PO TABS: ORAL_TABLET | ORAL | Status: DC

## 2014-09-18 MED ORDER — DEXTROSE 5 % IV SOLN
500.0000 mg | INTRAVENOUS | Status: DC
  Administered 2014-09-18: 500 mg via INTRAVENOUS
  Filled 2014-09-18 (×3): qty 500

## 2014-09-18 MED ORDER — METOPROLOL TARTRATE 25 MG PO TABS: 12.5000 mg | ORAL_TABLET | Freq: Two times a day (BID) | ORAL | Status: DC

## 2014-09-18 NOTE — Progress Notes (Signed)
[place] at [place] NAME: Katherine [name]    MR#:  [id number]  DATE OF BIRTH:  16-Oct-1940  SUBJECTIVE:  CHIEF COMPLAINT:   Chief Complaint  Patient presents with  . Shortness of Breath  . Chest Pain   Breathing well this morning. Oxygen saturations have been excellent on room air.  REVIEW OF SYSTEMS:   Review of Systems  Constitutional: Negative for fever.  Respiratory: Positive for cough, sputum production, shortness of breath and wheezing.   Cardiovascular: Negative for chest pain and palpitations.  Gastrointestinal: Negative for nausea, vomiting and abdominal pain.  Genitourinary: Negative for dysuria.    DRUG ALLERGIES:   Allergies  Allergen Reactions  . Enalapril Maleate     Angioedema     VITALS:  Blood pressure 144/77, pulse 53, temperature 98.5 F (36.9 C), temperature source Oral, resp. rate 18, height 5\' 6"  (1.676 m), weight 106.867 kg (235 lb 9.6 oz), SpO2 97 %.  PHYSICAL EXAMINATION:  GENERAL:  74 y.o.-year-old patient lying in the bed, comfortable no distress LUNGS: Fine wheezes, good air movement, no rhonchi or rales CARDIOVASCULAR: S1, S2 normal. No murmurs, rubs, or gallops. Tachycardic ABDOMEN: Soft, nontender, nondistended. Bowel sounds present. No organomegaly or mass.  EXTREMITIES: No pedal edema, cyanosis, or clubbing.  NEUROLOGIC: Cranial nerves II through XII are intact. Muscle strength 5/5 in all extremities. Sensation intact. Gait not checked.  PSYCHIATRIC: The patient is alert and oriented x 3.  SKIN: No obvious rash, lesion, or ulcer.    LABORATORY PANEL:   CBC  Recent Labs Lab 09/18/14 0425  WBC 10.5  HGB 13.0  HCT 38.9  PLT 210   ------------------------------------------------------------------------------------------------------------------  Chemistries   Recent Labs Lab 09/16/14 0020  09/18/14 0425  NA 139  < > 138  K 4.9  < > 4.9  CL 105  < > 106  CO2 26  < > 27   GLUCOSE 98  < > 117*  BUN 20  < > 38*  CREATININE 1.53*  < > 1.72*  CALCIUM 8.9  < > 9.2  AST 22  --   --   ALT 13*  --   --   ALKPHOS 69  --   --   BILITOT 0.7  --   --   < > = values in this interval not displayed. ------------------------------------------------------------------------------------------------------------------  Cardiac Enzymes  Recent Labs Lab 09/16/14 2019  TROPONINI <0.03   ------------------------------------------------------------------------------------------------------------------  RADIOLOGY:  US Renal  09/16/2014   CLINICAL DATA:  Renal lesion  EXAM: RENAL / URINARY TRACT ULTRASOUND COMPLETE  COMPARISON:  CT 09/16/2014  FINDINGS: Right Kidney:  Length: 10.1 cm. Echogenicity within normal limits. No mass or hydronephrosis visualized.  Left Kidney:  Length: 10.4 cm. 12 mm cyst left upper pole corresponds to the low-density lesion on CT. Echogenicity within normal limits. No mass or hydronephrosis visualized.  Bladder:  Negative  IMPRESSION: 12 mm left upper pole cyst corresponds to the CT abnormality. No mass or renal obstruction identified.   Electronically Signed   By: Franchot Gallo M.D.   On: 09/16/2014 16:08    EKG:   Orders placed or performed during the hospital encounter of 09/15/14  . ED EKG  . ED EKG  . EKG 12-Lead  . EKG 12-Lead    ASSESSMENT AND PLAN:    #1 acute respiratory failure with hypoxia: Resolved - CT chest negative for PE or pneumonia, no edema this is likely a COPD exacerbation - At  this time she is breathing well with no supplemental oxygen need  #2 COPD exacerbation: Improving - Current half pack per day smoker, smoking cessation counseling provided - Start prednisone, stop Solu-Medrol, continue nebulizers, azithromycin  #3 hypertension: - Hypertensive on presentation, has history of LVH likely due to chronic uncontrolled hypertension - Have started metoprolol 12.5 twice a day which seems to be helping, continue  hydralazine and she was on at home  #4 renal cyst/mass - Renal ultrasound shows simple cyst  #5 chest pain: Has ruled out for ACS - Initial cardiac enzymes are negative, EKG with no signs of ischemia - Monitor on telemetry and cycle cardiac enzymes - Echocardiogram shows preserved ejection fraction, essentially normal - Continue aspirin, statin, start metoprolol  #5 prophylaxis: Continue PPI, heparin for DVT prophylaxis  #6 acute on chronic kidney disease stage III - Hydrating today, if renal function returns to baseline can be discharged this afternoon  CODE STATUS: Full   TOTAL TIME TAKING CARE OF THIS PATIENT: 30 minutes.  Greater than 50% of time spent in care coordination and counseling. POSSIBLE D/C IN 1 DAYS, DEPENDING ON CLINICAL CONDITION.   Myrtis Ser M.D on 09/18/2014 at 2:06 PM  Between 7am to 6pm - Pager - [phone]  After 6pm go to [email or web address] - password [email or web address] [facility]  [place] [facility]  Office  [phone]  CC: Primary care physician; Suzie Portela, FNP

## 2014-09-18 NOTE — Care Management (Signed)
Patient has maintained her 02 sats.  At present, there are no indication of need for home health nursing

## 2014-09-18 NOTE — Care Management (Signed)
Obtained order for overnight oximetry and sent to [facility]

## 2014-09-18 NOTE — Care Management (Signed)
Patient lives with her husband and is independent in all her adls.  Discussed need to qualify for home 02 during progression 8/4.    They were as follows 1700  8/4  SATURATION QUALIFICATIONS: (This note is used to comply with regulatory documentation for home oxygen)  Patient Saturations on Room Air at Rest  93%  Patient Saturations on Room Air while Ambulating 84%  Patient Saturations on  Liters of oxygen while Ambulating  This section was not completed because nurse states that sats came back up to 91% on room air on their own   Have discussed the need to attempt to requalify to primary nurse today.  If patient does not qualify, may benefit from overnight oximetry

## 2014-09-19 DIAGNOSIS — N281 Cyst of kidney, acquired: Secondary | ICD-10-CM | POA: Diagnosis not present

## 2014-09-19 DIAGNOSIS — I129 Hypertensive chronic kidney disease with stage 1 through stage 4 chronic kidney disease, or unspecified chronic kidney disease: Secondary | ICD-10-CM | POA: Diagnosis not present

## 2014-09-19 DIAGNOSIS — J441 Chronic obstructive pulmonary disease with (acute) exacerbation: Secondary | ICD-10-CM | POA: Diagnosis not present

## 2014-09-19 DIAGNOSIS — N183 Chronic kidney disease, stage 3 (moderate): Secondary | ICD-10-CM | POA: Diagnosis not present

## 2014-09-19 DIAGNOSIS — N179 Acute kidney failure, unspecified: Secondary | ICD-10-CM | POA: Diagnosis not present

## 2014-09-19 DIAGNOSIS — F17210 Nicotine dependence, cigarettes, uncomplicated: Secondary | ICD-10-CM | POA: Diagnosis not present

## 2014-09-19 DIAGNOSIS — I517 Cardiomegaly: Secondary | ICD-10-CM | POA: Diagnosis not present

## 2014-09-19 DIAGNOSIS — R079 Chest pain, unspecified: Secondary | ICD-10-CM | POA: Diagnosis not present

## 2014-09-19 DIAGNOSIS — J9601 Acute respiratory failure with hypoxia: Secondary | ICD-10-CM | POA: Diagnosis not present

## 2014-09-19 DIAGNOSIS — I10 Essential (primary) hypertension: Secondary | ICD-10-CM | POA: Diagnosis not present

## 2014-09-19 LAB — BASIC METABOLIC PANEL
Anion gap: 5 (ref 5–15)
BUN: 45 mg/dL — AB (ref 6–20)
CO2: 25 mmol/L (ref 22–32)
Calcium: 8.6 mg/dL — ABNORMAL LOW (ref 8.9–10.3)
Chloride: 111 mmol/L (ref 101–111)
Creatinine, Ser: 1.64 mg/dL — ABNORMAL HIGH (ref 0.44–1.00)
GFR calc Af Amer: 35 mL/min — ABNORMAL LOW (ref >60)
GFR calc non Af Amer: 30 mL/min — ABNORMAL LOW (ref >60)
Glucose, Bld: 83 mg/dL (ref 65–99)
Potassium: 4.6 mmol/L (ref 3.5–5.1)
Sodium: 141 mmol/L (ref 135–145)

## 2014-09-19 NOTE — Discharge Summary (Signed)
[place] at [place] NAME: Katherine [name]    MR#:  [id number]  DATE OF BIRTH:  1940/12/23  DATE OF ADMISSION:  09/15/2014 ADMITTING PHYSICIAN: Aldean Jewett, MD  DATE OF DISCHARGE: 09/19/14  PRIMARY CARE PHYSICIAN: Suzie Portela, FNP    ADMISSION DIAGNOSIS:  Dyspnea [R06.00] Chest pain [R07.9] Chest pain, unspecified chest pain type [R07.9]  DISCHARGE DIAGNOSIS:  Principal Problem:   Acute respiratory failure with hypoxia Active Problems:   LVH (left ventricular hypertrophy)   Essential hypertension   Morbid obesity   COPD (chronic obstructive pulmonary disease)   Chronic kidney disease   Chest pain   SECONDARY DIAGNOSIS:   Past Medical History  Diagnosis Date  . Anxiety and depression   . Tobacco abuse   . Angioedema   . Gallstones   . Prurigo nodularis   . Anxiety   . Hypertension   . GERD (gastroesophageal reflux disease)   . COPD (chronic obstructive pulmonary disease)   . Osteoarthritis   . Vitamin D deficiency disease   . Chronic constipation   . Chronic kidney disease     stage 3  . Left ventricular hypertrophy     HOSPITAL COURSE:   #1 acute respiratory failure with hypoxia: She presentation was actually for chest pain, however during her emergency room evaluation she became acutely hypoxic with sats in the 80%. CT chest negative for PE or pneumonia, no edema this is likely a COPD exacerbation. She does not formerly carry a diagnosis of COPD though she is a long-term smoker. She required supplemental oxygen for about 24 hours and at this time is saturating well on room air. She is also obese, at high risk for obstructive sleep apnea. A home health overnight pulse oximetry study has been ordered.  #2 COPD exacerbation: Improved. Smoking cessation counseling provided. She will be discharged on azithromycin and prednisone. She will need to follow-up with her primary care provider  in the outpatient setting for further management.  #3 hypertension: Hypertensive on presentation, has history of LVH likely due to chronic uncontrolled hypertension versus due to obstructive sleep apnea. Have started metoprolol 12.5 twice a day during the hospitalization which seems to be helping, continue hydralazine and she was on at home.  #4 renal cyst/mass: Renal ultrasound shows simple cyst.  #5 chest pain: Has ruled out for ACS. Cardiac enzymes are negative 3, EKG with no signs of ischemia no events on telemetry. Echocardiogram shows preserved ejection fraction, essentially normal with the exception of LVH. Likely has diastolic dysfunction to some degree. Continue aspirin, start metoprolol  #6 acute on chronic kidney disease stage III: Small bump in creatinine during the hospitalization likely due to episodes of hypertension. Returning to normal after hydration. I'll need to be monitored in the outpatient setting.  DISCHARGE CONDITIONS:   Stable  CONSULTS OBTAINED:  Treatment Team:  Aldean Jewett, MD  DRUG ALLERGIES:   Allergies  Allergen Reactions  . Enalapril Maleate     Angioedema     DISCHARGE MEDICATIONS:   Current Discharge Medication List    START taking these medications   Details  azithromycin (ZITHROMAX Z-PAK) 250 MG tablet Take two tablets on first day and then one daily for 4 days and then stop. Qty: 6 each, Refills: 0    benzonatate (TESSALON) 100 MG capsule Take 1 capsule (100 mg total) by mouth 3 (three) times daily as needed for cough. Qty: 20 capsule, Refills: 0  metoprolol tartrate (LOPRESSOR) 25 MG tablet Take 0.5 tablets (12.5 mg total) by mouth 2 (two) times daily. Qty: 60 tablet, Refills: 0    predniSONE (DELTASONE) 50 MG tablet Take one tablet daily for 3 days and then stop. Qty: 3 tablet, Refills: 0    traMADol (ULTRAM) 50 MG tablet Take 1 tablet (50 mg total) by mouth every 6 (six) hours as needed. Qty: 12 tablet, Refills: 0       CONTINUE these medications which have NOT CHANGED   Details  acetaminophen (TYLENOL) 650 MG CR tablet Take 650 mg by mouth every 8 (eight) hours as needed for pain.    aspirin 81 MG tablet Take 81 mg by mouth daily.    cetirizine (ZYRTEC) 10 MG tablet Take 5 mg by mouth at bedtime.     FLUoxetine (PROZAC) 10 MG tablet Take 10 mg by mouth daily.    hydrALAZINE (APRESOLINE) 25 MG tablet Take 1 tablet (25 mg total) by mouth 2 (two) times daily. Qty: 60 tablet, Refills: 5    omeprazole (PRILOSEC) 40 MG capsule Take 40 mg by mouth daily.    triamcinolone cream (KENALOG) 0.1 % Apply 1 application topically 2 (two) times daily as needed. Qty: 45 g, Refills: 2    Vitamin D, Cholecalciferol, 1000 UNITS CAPS Take 1 capsule by mouth daily.         DISCHARGE INSTRUCTIONS:    DIET:  Cardiac diet  DISCHARGE CONDITION:  Good  ACTIVITY:  Activity as tolerated  OXYGEN:  Home Oxygen: No.   Oxygen Delivery: room air  DISCHARGE LOCATION:  home   If you experience worsening of your admission symptoms, develop shortness of breath, life threatening emergency, suicidal or homicidal thoughts you must seek medical attention immediately by calling 911 or calling your MD immediately  if symptoms less severe.  You Must read complete instructions/literature along with all the possible adverse reactions/side effects for all the Medicines you take and that have been prescribed to you. Take any new Medicines after you have completely understood and accpet all the possible adverse reactions/side effects.   Please note  You were cared for by a hospitalist during your hospital stay. If you have any questions about your discharge medications or the care you received while you were in the hospital after you are discharged, you can call the unit and asked to speak with the hospitalist on call if the hospitalist that took care of you is not available. Once you are discharged, your primary care physician  will handle any further medical issues. Please note that NO REFILLS for any discharge medications will be authorized once you are discharged, as it is imperative that you return to your primary care physician (or establish a relationship with a primary care physician if you do not have one) for your aftercare needs so that they can reassess your need for medications and monitor your lab values.    Today   CHIEF COMPLAINT:   Chief Complaint  Patient presents with  . Shortness of Breath  . Chest Pain    HISTORY OF PRESENT ILLNESS:  Katherine [name] is a 74 y.o. female with known history of hypertension who comes in today with chest pain. The patient reports the pain started when she came home from church last night around 9 PM. She has a cough and the pain is worse when she coughs, pain is sharp in the mid epigastric area . She has never had pain like this before. No fever but  has had some vomiting and has some feeling of shortness of breath. Pain as a 10 out of 10, no sweats but says she also has some pain in her back as well as leg swelling.The patient reports that she was unable to tolerate the pain so she called the ambulance to bring her into the hospital. Her initial emergency room workup including CT angiogram of the chest was negative. She was about to be discharged to home when she had an acute hypoxic event with oxygen saturation in the 80s accompanied by tachycardia. She is admitted for acute respiratory failure with hypoxia.  VITAL SIGNS:  Blood pressure 122/57, pulse 70, temperature 97.3 F (36.3 C), temperature source Oral, resp. rate 25, height 5\' 6"  (1.676 m), weight 106.53 kg (234 lb 13.7 oz), SpO2 100 %.  I/O:   Intake/Output Summary (Last 24 hours) at 09/19/14 0957 Last data filed at 09/19/14 0745  Gross per 24 hour  Intake   1970 ml  Output   3650 ml  Net  -1680 ml    PHYSICAL EXAMINATION:  GENERAL:  74 y.o.-year-old patient lying in the bed with no acute distress.  Obese LUNGS: Auscultation bilaterally with good air movement CARDIOVASCULAR: S1, S2 normal. No murmurs, rubs, or gallops.  ABDOMEN: Soft, non-tender, non-distended. Bowel sounds present. No organomegaly or mass.  EXTREMITIES: No pedal edema, cyanosis, or clubbing.  NEUROLOGIC: Cranial nerves II through XII are intact. Muscle strength 5/5 in all extremities. Sensation intact. Gait not checked.  PSYCHIATRIC: The patient is alert and oriented x 3.  SKIN: No obvious rash, lesion, or ulcer.   DATA REVIEW:   CBC  Recent Labs Lab 09/18/14 0425  WBC 10.5  HGB 13.0  HCT 38.9  PLT 210    Chemistries   Recent Labs Lab 09/16/14 0020  09/19/14 0811  NA 139  < > 141  K 4.9  < > 4.6  CL 105  < > 111  CO2 26  < > 25  GLUCOSE 98  < > 83  BUN 20  < > 45*  CREATININE 1.53*  < > 1.64*  CALCIUM 8.9  < > 8.6*  AST 22  --   --   ALT 13*  --   --   ALKPHOS 69  --   --   BILITOT 0.7  --   --   < > = values in this interval not displayed.  Cardiac Enzymes  Recent Labs Lab 09/16/14 2019  TROPONINI <0.03    Microbiology Results  Results for orders placed or performed in visit on 02/20/13  Urine culture     Status: None   Collection Time: 02/20/13  2:59 PM  Result Value Ref Range Status   Micro Text Report   Final       SOURCE: CLEAN CATCH    COMMENT                   MIXED BACTERIAL ORGANISMS   COMMENT                   RESULTS SUGGESTIVE OF CONTAMINATION   ANTIBIOTIC                                                        RADIOLOGY:  No results found.  EKG:   Orders  placed or performed during the hospital encounter of 09/15/14  . ED EKG  . ED EKG  . EKG 12-Lead  . EKG 12-Lead      Management plans discussed with the patient, family and they are in agreement.  CODE STATUS:     Code Status Orders        Start     Ordered   09/16/14 1220  Full code   Continuous     09/16/14 1220      TOTAL TIME TAKING CARE OF THIS PATIENT: 35 minutes.  Greater than  50% of time spent in care coordination and counseling. Spoke with family prior to discharge.  Myrtis Ser M.D on 09/19/2014 at 9:57 AM  Between 7am to 6pm - Pager - [phone]  After 6pm go to [email or web address] - password [email or web address] [facility]  [place] [facility]  Office  [phone]  CC: Primary care physician; Suzie Portela, FNP

## 2014-09-19 NOTE — Plan of Care (Signed)
Problem: Phase I Progression Outcomes Goal: Anginal pain relieved Outcome: Completed/Met Date Met:  09/19/14 Patient reports no pain

## 2014-09-19 NOTE — Progress Notes (Signed)
Patient sitting up in bed, no distress, no pain. Will be discharged today

## 2014-09-19 NOTE — Discharge Instructions (Signed)
DIET:  Cardiac diet  DISCHARGE CONDITION:  Fair  ACTIVITY:  Activity as tolerated  OXYGEN:  Home Oxygen: No.   Oxygen Delivery: room air  DISCHARGE LOCATION:  home   If you experience worsening of your admission symptoms, develop shortness of breath, life threatening emergency, suicidal or homicidal thoughts you must seek medical attention immediately by calling 911 or calling your MD immediately  if symptoms less severe.  You Must read complete instructions/literature along with all the possible adverse reactions/side effects for all the Medicines you take and that have been prescribed to you. Take any new Medicines after you have completely understood and accpet all the possible adverse reactions/side effects.   Please note  You were cared for by a hospitalist during your hospital stay. If you have any questions about your discharge medications or the care you received while you were in the hospital after you are discharged, you can call the unit and asked to speak with the hospitalist on call if the hospitalist that took care of you is not available. Once you are discharged, your primary care physician will handle any further medical issues. Please note that NO REFILLS for any discharge medications will be authorized once you are discharged, as it is imperative that you return to your primary care physician (or establish a relationship with a primary care physician if you do not have one) for your aftercare needs so that they can reassess your need for medications and monitor your lab values.      Chest Pain (Nonspecific) It is often hard to give a specific diagnosis for the cause of chest pain. There is always a chance that your pain could be related to something serious, such as a heart attack or a blood clot in the lungs. You need to follow up with your health care provider for further evaluation. CAUSES   Heartburn.  Pneumonia or bronchitis.  Anxiety or  stress.  Inflammation around your heart (pericarditis) or lung (pleuritis or pleurisy).  A blood clot in the lung.  A collapsed lung (pneumothorax). It can develop suddenly on its own (spontaneous pneumothorax) or from trauma to the chest.  Shingles infection (herpes zoster virus). The chest wall is composed of bones, muscles, and cartilage. Any of these can be the source of the pain.  The bones can be bruised by injury.  The muscles or cartilage can be strained by coughing or overwork.  The cartilage can be affected by inflammation and become sore (costochondritis). DIAGNOSIS  Lab tests or other studies may be needed to find the cause of your pain. Your health care provider may have you take a test called an ambulatory electrocardiogram (ECG). An ECG records your heartbeat patterns over a 24-hour period. You may also have other tests, such as:  Transthoracic echocardiogram (TTE). During echocardiography, sound waves are used to evaluate how blood flows through your heart.  Transesophageal echocardiogram (TEE).  Cardiac monitoring. This allows your health care provider to monitor your heart rate and rhythm in real time.  Holter monitor. This is a portable device that records your heartbeat and can help diagnose heart arrhythmias. It allows your health care provider to track your heart activity for several days, if needed.  Stress tests by exercise or by giving medicine that makes the heart beat faster. TREATMENT   Treatment depends on what may be causing your chest pain. Treatment may include:  Acid blockers for heartburn.  Anti-inflammatory medicine.  Pain medicine for inflammatory conditions.  Antibiotics if  an infection is present.  You may be advised to change lifestyle habits. This includes stopping smoking and avoiding alcohol, caffeine, and chocolate.  You may be advised to keep your head raised (elevated) when sleeping. This reduces the chance of acid going backward  from your stomach into your esophagus. Most of the time, nonspecific chest pain will improve within 2-3 days with rest and mild pain medicine.  HOME CARE INSTRUCTIONS   If antibiotics were prescribed, take them as directed. Finish them even if you start to feel better.  For the next few days, avoid physical activities that bring on chest pain. Continue physical activities as directed.  Do not use any tobacco products, including cigarettes, chewing tobacco, or electronic cigarettes.  Avoid drinking alcohol.  Only take medicine as directed by your health care provider.  Follow your health care provider's suggestions for further testing if your chest pain does not go away.  Keep any follow-up appointments you made. If you do not go to an appointment, you could develop lasting (chronic) problems with pain. If there is any problem keeping an appointment, call to reschedule. SEEK MEDICAL CARE IF:   Your chest pain does not go away, even after treatment.  You have a rash with blisters on your chest.  You have a fever. SEEK IMMEDIATE MEDICAL CARE IF:   You have increased chest pain or pain that spreads to your arm, neck, jaw, back, or abdomen.  You have shortness of breath.  You have an increasing cough, or you cough up blood.  You have severe back or abdominal pain.  You feel nauseous or vomit.  You have severe weakness.  You faint.  You have chills. This is an emergency. Do not wait to see if the pain will go away. Get medical help at once. Call your local emergency services (911 in [place] Do not drive yourself to the hospital. MAKE SURE YOU:   Understand these instructions.  Will watch your condition.  Will get help right away if you are not doing well or get worse. Document Released: 11/09/2004 Document Revised: 02/04/2013 Document Reviewed: 09/05/2007 [facility] Patient Information 2015 [place], [place]. This information is not intended to replace advice given to you by  your health care provider. Make sure you discuss any questions you have with your health care provider.    [facility] WILL CONTACT TO SET UP OVERNIGHT OXIMETRY  [phone]--EXPECT A CALL-NO LATER THAN MONDAY

## 2014-09-19 NOTE — Progress Notes (Signed)
Spoke with dr. Volanda Napoleon to make aware tramadol rx is not on patients chart. Per md patient does not need prescription, she will take off of discharge orders [employer]

## 2014-09-19 NOTE — Progress Notes (Signed)
Removed patient's IV and telemetry box.  Patient dressing, family here to take her home.

## 2014-09-19 NOTE — Progress Notes (Addendum)
Discharge education completed with patient. Patient verbalized understanding and that prescriptions have been called into the pharmacy.  Patient not in distress waiting on family to arrive to remove telemetry and remove IV

## 2014-09-29 DIAGNOSIS — I119 Hypertensive heart disease without heart failure: Secondary | ICD-10-CM | POA: Diagnosis not present

## 2014-09-29 DIAGNOSIS — I10 Essential (primary) hypertension: Secondary | ICD-10-CM | POA: Diagnosis not present

## 2014-09-29 DIAGNOSIS — I517 Cardiomegaly: Secondary | ICD-10-CM | POA: Diagnosis not present

## 2015-01-11 ENCOUNTER — Other Ambulatory Visit: Payer: Self-pay | Admitting: Family Medicine

## 2015-01-11 NOTE — Telephone Encounter (Signed)
Dr Sanda Klein patient-routing to provider.

## 2015-02-02 ENCOUNTER — Emergency Department
Admission: EM | Admit: 2015-02-02 | Discharge: 2015-02-02 | Disposition: A | Discharge status: Home / Self Care | Payer: Commercial Managed Care - HMO | Attending: Emergency Medicine | Admitting: Emergency Medicine

## 2015-02-02 ENCOUNTER — Emergency Department: Payer: Commercial Managed Care - HMO

## 2015-02-02 ENCOUNTER — Encounter: Payer: Self-pay | Admitting: Emergency Medicine

## 2015-02-02 DIAGNOSIS — Z79899 Other long term (current) drug therapy: Secondary | ICD-10-CM | POA: Insufficient documentation

## 2015-02-02 DIAGNOSIS — J209 Acute bronchitis, unspecified: Secondary | ICD-10-CM

## 2015-02-02 DIAGNOSIS — Z7982 Long term (current) use of aspirin: Secondary | ICD-10-CM | POA: Diagnosis not present

## 2015-02-02 DIAGNOSIS — Z7952 Long term (current) use of systemic steroids: Secondary | ICD-10-CM | POA: Diagnosis not present

## 2015-02-02 DIAGNOSIS — N189 Chronic kidney disease, unspecified: Secondary | ICD-10-CM | POA: Insufficient documentation

## 2015-02-02 DIAGNOSIS — I129 Hypertensive chronic kidney disease with stage 1 through stage 4 chronic kidney disease, or unspecified chronic kidney disease: Secondary | ICD-10-CM | POA: Diagnosis not present

## 2015-02-02 DIAGNOSIS — J441 Chronic obstructive pulmonary disease with (acute) exacerbation: Secondary | ICD-10-CM | POA: Diagnosis not present

## 2015-02-02 DIAGNOSIS — F17210 Nicotine dependence, cigarettes, uncomplicated: Secondary | ICD-10-CM | POA: Insufficient documentation

## 2015-02-02 DIAGNOSIS — J219 Acute bronchiolitis, unspecified: Secondary | ICD-10-CM | POA: Diagnosis not present

## 2015-02-02 DIAGNOSIS — R0602 Shortness of breath: Secondary | ICD-10-CM | POA: Diagnosis not present

## 2015-02-02 DIAGNOSIS — R05 Cough: Secondary | ICD-10-CM | POA: Diagnosis not present

## 2015-02-02 DIAGNOSIS — J9801 Acute bronchospasm: Secondary | ICD-10-CM

## 2015-02-02 MED ORDER — ALBUTEROL SULFATE HFA 108 (90 BASE) MCG/ACT IN AERS: 2.0000 | INHALATION_SPRAY | RESPIRATORY_TRACT | Status: DC | PRN

## 2015-02-02 MED ORDER — PREDNISONE 20 MG PO TABS
60.0000 mg | ORAL_TABLET | Freq: Once | ORAL | Status: AC
  Administered 2015-02-02: 60 mg via ORAL
  Filled 2015-02-02: qty 3

## 2015-02-02 MED ORDER — PREDNISONE 20 MG PO TABS: 40.0000 mg | ORAL_TABLET | Freq: Every day | ORAL | Status: DC

## 2015-02-02 NOTE — ED Provider Notes (Signed)
[facility] Emergency Department Provider Note  ____________________________________________  Time seen: 2:20 PM  I have reviewed the triage vital signs and the nursing notes.   HISTORY  Chief Complaint Cough and Fever    HPI Katherine [name] is a 74 y.o. female complains of nonproductive cough, congestion, shortness of breath and body aches for 5 days.denies chest pain abdominal pain or vomiting. She does report some loose bowel movements over last few days. Has subjective fevers but has not measured it. No dizziness or syncope.     Past Medical History  Diagnosis Date  . Anxiety and depression   . Tobacco abuse   . Angioedema   . Gallstones   . Prurigo nodularis   . Anxiety   . Hypertension   . GERD (gastroesophageal reflux disease)   . COPD (chronic obstructive pulmonary disease) ([place])   . Osteoarthritis   . Vitamin D deficiency disease   . Chronic constipation   . Chronic kidney disease     stage 3  . Left ventricular hypertrophy      Patient Active Problem List   Diagnosis Date Noted  . Chest pain 09/16/2014  . Acute respiratory failure with hypoxia ([place]) 09/16/2014  . 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 Anxiety   . Tobacco abuse   . Prurigo nodularis   . Angioedema   . Hypertensive heart disease   . GERD (gastroesophageal reflux disease)   . COPD (chronic obstructive pulmonary disease) ([place])   . Osteoarthritis   . Vitamin D deficiency disease   . Chronic constipation   . Chronic kidney disease   . SOB (shortness of breath) 05/20/2013  . Right foot pain 05/20/2013  . LVH (left ventricular hypertrophy) 05/20/2013  . Essential hypertension 05/20/2013  . Morbid obesity ([place]) 05/20/2013     Past Surgical History  Procedure Laterality Date  . Cholecystectomy    . 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 acetaminophen (TYLENOL) 650 MG CR tablet   Oral   Take  650 mg by mouth every 8 (eight) hours as needed for pain.         [name] albuterol (PROVENTIL HFA) 108 (90 BASE) MCG/ACT inhaler   Inhalation   Inhale 2 puffs into the lungs every 4 (four) hours as needed for wheezing or shortness of breath.   1 Inhaler   0   . aspirin 81 MG tablet   Oral   Take 81 mg by mouth daily.         [name] azithromycin (ZITHROMAX Z-PAK) 250 MG tablet      Take two tablets on first day and then one daily for 4 days and then stop.   6 each   0   . benzonatate (TESSALON) 100 MG capsule   Oral   Take 1 capsule (100 mg total) by mouth 3 (three) times daily as needed for cough.   20 capsule   0   . cetirizine (ZYRTEC) 10 MG tablet      TAKE 1/2 TABLET BY MOUTH AT BEDTIME AS NEEDED   15 tablet   0   . FLUoxetine (PROZAC) 10 MG capsule      TAKE ONE CAPSULE BY MOUTH EVERY DAY   30 capsule   0   . FLUoxetine (PROZAC) 10 MG tablet   Oral   Take 10 mg by mouth daily.         [name]  hydrALAZINE (APRESOLINE) 25 MG tablet   Oral   Take 1 tablet (25 mg total) by mouth 2 (two) times daily.   60 tablet   5   . metoprolol tartrate (LOPRESSOR) 25 MG tablet   Oral   Take 0.5 tablets (12.5 mg total) by mouth 2 (two) times daily.   60 tablet   0   . omeprazole (PRILOSEC) 40 MG capsule   Oral   Take 40 mg by mouth daily.         . predniSONE (DELTASONE) 20 MG tablet   Oral   Take 2 tablets (40 mg total) by mouth daily.   8 tablet   0   . traMADol (ULTRAM) 50 MG tablet   Oral   Take 1 tablet (50 mg total) by mouth every 6 (six) hours as needed.   12 tablet   0   . triamcinolone cream (KENALOG) 0.1 %   Topical   Apply 1 application topically 2 (two) times daily as needed.   45 g   2   . Vitamin D, Cholecalciferol, 1000 UNITS CAPS   Oral   Take 1 capsule by mouth daily.            Allergies Enalapril maleate   Family History  Problem Relation Age of Onset  . Alcohol abuse Mother     Social History Social History  Substance Use  Topics  . Smoking status: Current Some Day Smoker -- 0.50 packs/day for 52 years    Types: Cigarettes  . Smokeless tobacco: Never Used  . Alcohol Use: No     Comment: rare    Review of Systems  Constitutional:  Positive subjective fevers. No weight changes Eyes:   No blurry vision or double vision.  ENT:   positivesore throat. Cardiovascular:   No chest pain. Respiratory:   Positive shortness of breath and nonproductive coughgh. Gastrointestinal:   Negative for abdominal pain, vomiting and diarrhea.  No BRBPR or melena. Genitourinary:   Negative for dysuria, urinary retention, bloody urine, or difficulty urinating. Musculoskeletal:   Negative for back pain. No joint swelling or pain. Skin:   Negative for rash. Neurological:   Negative for headaches, focal weakness or numbness. Psychiatric:  No anxiety or depression.   Endocrine:  No hot/cold intolerance, changes in energy, or sleep difficulty.  10-point ROS otherwise negative.  ____________________________________________   PHYSICAL EXAM:  VITAL SIGNS: ED Triage Vitals  Enc Vitals Group     BP 02/02/15 1004 116/81 mmHg     Pulse Rate 02/02/15 1004 97     Resp 02/02/15 1004 22     Temp 02/02/15 1004 98.7 F (37.1 C)     Temp Source 02/02/15 1004 Oral     SpO2 02/02/15 1004 96 %     Weight 02/02/15 1004 230 lb (104.327 kg)     Height 02/02/15 1004 5\' 8"  (1.727 m)     Head Cir --      Peak Flow --      Pain Score 02/02/15 1007 9     Pain Loc --      Pain Edu? --      Excl. in [place]? --     Vital signs reviewed, nursing assessments reviewed.   Constitutional:   Alert and oriented. Well appearing and in no distress. Eyes:   No scleral icterus. No conjunctival pallor. PERRL. EOMI ENT   Head:   Normocephalic and atraumatic.   Nose:   No congestion/rhinnorhea. No septal hematoma  Mouth/Throat:   MMM, no pharyngeal erythema. No peritonsillar mass. No uvula shift.   Neck:   No stridor. No SubQ emphysema.  No meningismus. Hematological/Lymphatic/Immunilogical:   No cervical lymphadenopathy. Cardiovascular:   RRR. Normal and symmetric distal pulses are present in all extremities. No murmurs, rubs, or gallops. Respiratory:   Normal respiratory effort without tachypnea nor retractions. Diffuse expiratory wheezing, bronchospastic coughing with deep inspiration. Gastrointestinal:   Soft and nontender. No distention. There is no CVA tenderness.  No rebound, rigidity, or guarding. Genitourinary:   deferred Musculoskeletal:   Nontender with normal range of motion in all extremities. No joint effusions.  No lower extremity tenderness.  No edema. Neurologic:   Normal speech and language.  CN 2-10 normal. Motor grossly intact. No pronator drift.  Normal gait. No gross focal neurologic deficits are appreciated.  Skin:    Skin is warm, dry and intact. No rash noted.  No petechiae, purpura, or bullae. Psychiatric:   Mood and affect are normal. Speech and behavior are normal. Patient exhibits appropriate insight and judgment.  ____________________________________________    LABS (pertinent positives/negatives) (all labs ordered are listed, but only abnormal results are displayed) Labs Reviewed - No data to display ____________________________________________   EKG  Interpreted by me Normal sinus rhythm rate of 97, left axis, normal intervals. Poor R-wave progression in anterior precordial leads. Normal ST segments and T waves.  ____________________________________________    RADIOLOGY  Chest x-ray unremarkable  ____________________________________________   PROCEDURES   ____________________________________________   INITIAL IMPRESSION / ASSESSMENT AND PLAN / ED COURSE  Pertinent labs & imaging results that were available during my care of the patient were reviewed by me and considered in my medical decision making (see chart for details).  Patient presents with cough and shortness  of breath which appears to be due to acute bronchitis with bronchospasm. No focal findings, low suspicion for pneumonia with reassuring vital signs and x-ray. Low suspicion for PE or cardiopulmonary pathology. We'll start on a steroid burst and albuterol. She reports she has taken albuterol in the past. Follow-up primary care in 2-3 days.     ____________________________________________   FINAL CLINICAL IMPRESSION(S) / ED DIAGNOSES  Final diagnoses:  Acute bronchospasm  Acute bronchitis, unspecified organism      Carrie Mew, MD 02/02/15 1431

## 2015-02-02 NOTE — ED Notes (Signed)
Pt to ed with c/o cough, congestion, decreased appetite, sob, body aches since last Friday.

## 2015-02-02 NOTE — Discharge Instructions (Signed)
Acute Bronchitis °Bronchitis is inflammation of the airways that extend from the windpipe into the lungs (bronchi). The inflammation often causes mucus to develop. This leads to a cough, which is the most common symptom of bronchitis.  °In acute bronchitis, the condition usually develops suddenly and goes away over time, usually in a couple weeks. Smoking, allergies, and asthma can make bronchitis worse. Repeated episodes of bronchitis may cause further lung problems.  °CAUSES °Acute bronchitis is most often caused by the same virus that causes a cold. The virus can spread from person to person (contagious) through coughing, sneezing, and touching contaminated objects. °SIGNS AND SYMPTOMS  °· Cough.   °· Fever.   °· Coughing up mucus.   °· Body aches.   °· Chest congestion.   °· Chills.   °· Shortness of breath.   °· Sore throat.   °DIAGNOSIS  °Acute bronchitis is usually diagnosed through a physical exam. Your health care provider will also ask you questions about your medical history. Tests, such as chest X-rays, are sometimes done to rule out other conditions.  °TREATMENT  °Acute bronchitis usually goes away in a couple weeks. Oftentimes, no medical treatment is necessary. Medicines are sometimes given for relief of fever or cough. Antibiotic medicines are usually not needed but may be prescribed in certain situations. In some cases, an inhaler may be recommended to help reduce shortness of breath and control the cough. A cool mist vaporizer may also be used to help thin bronchial secretions and make it easier to clear the chest.  °HOME CARE INSTRUCTIONS °· Get plenty of rest.   °· Drink enough fluids to keep your urine clear or pale yellow (unless you have a medical condition that requires fluid restriction). Increasing fluids may help thin your respiratory secretions (sputum) and reduce chest congestion, and it will prevent dehydration.   °· Take medicines only as directed by your health care provider. °· If  you were prescribed an antibiotic medicine, finish it all even if you start to feel better. °· Avoid smoking and secondhand smoke. Exposure to cigarette smoke or irritating chemicals will make bronchitis worse. If you are a smoker, consider using nicotine gum or skin patches to help control withdrawal symptoms. Quitting smoking will help your lungs heal faster.   °· Reduce the chances of another bout of acute bronchitis by washing your hands frequently, avoiding people with cold symptoms, and trying not to touch your hands to your mouth, nose, or eyes.   °· Keep all follow-up visits as directed by your health care provider.   °SEEK MEDICAL CARE IF: °Your symptoms do not improve after 1 week of treatment.  °SEEK IMMEDIATE MEDICAL CARE IF: °· You develop an increased fever or chills.   °· You have chest pain.   °· You have severe shortness of breath. °· You have bloody sputum.   °· You develop dehydration. °· You faint or repeatedly feel like you are going to pass out. °· You develop repeated vomiting. °· You develop a severe headache. °MAKE SURE YOU:  °· Understand these instructions. °· Will watch your condition. °· Will get help right away if you are not doing well or get worse. °  °This information is not intended to replace advice given to you by your health care provider. Make sure you discuss any questions you have with your health care provider. °  °Document Released: 03/09/2004 Document Revised: 02/20/2014 Document Reviewed: 07/23/2012 °Elsevier Interactive Patient Education ©2016 [employer] °Bronchospasm, Adult °A bronchospasm is a spasm or tightening of the airways going into the lungs. During a bronchospasm   breathing becomes more difficult because the airways get smaller. When this happens there can be coughing, a whistling sound when breathing (wheezing), and difficulty breathing. Bronchospasm is often associated with asthma, but not all patients who experience a bronchospasm have asthma. °CAUSES  °A  bronchospasm is caused by inflammation or irritation of the airways. The inflammation or irritation may be triggered by:  °· Allergies (such as to animals, pollen, food, or mold). Allergens that cause bronchospasm may cause wheezing immediately after exposure or many hours later.   °· Infection. Viral infections are believed to be the most common cause of bronchospasm.   °· Exercise.   °· Irritants (such as pollution, cigarette smoke, strong odors, aerosol sprays, and paint fumes).   °· Weather changes. Winds increase molds and pollens in the air. Rain refreshes the air by washing irritants out. Cold air may cause inflammation.   °· Stress and emotional upset.   °SIGNS AND SYMPTOMS  °· Wheezing.   °· Excessive nighttime coughing.   °· Frequent or severe coughing with a simple cold.   °· Chest tightness.   °· Shortness of breath.   °DIAGNOSIS  °Bronchospasm is usually diagnosed through a history and physical exam. Tests, such as chest X-rays, are sometimes done to look for other conditions. °TREATMENT  °· Inhaled medicines can be given to open up your airways and help you breathe. The medicines can be given using either an inhaler or a nebulizer machine. °· Corticosteroid medicines may be given for severe bronchospasm, usually when it is associated with asthma. °HOME CARE INSTRUCTIONS  °· Always have a plan prepared for seeking medical care. Know when to call your health care provider and local emergency services (911 in the [place] Know where you can access local emergency care. °· Only take medicines as directed by your health care provider. °· If you were prescribed an inhaler or nebulizer machine, ask your health care provider to explain how to use it correctly. Always use a spacer with your inhaler if you were given one. °· It is necessary to remain calm during an attack. Try to relax and breathe more slowly.  °· Control your home environment in the following ways:   °· Change your heating and air conditioning  filter at least once a month.   °· Limit your use of fireplaces and wood stoves. °· Do not smoke and do not allow smoking in your home.   °· Avoid exposure to perfumes and fragrances.   °· Get rid of pests (such as roaches and mice) and their droppings.   °· Throw away plants if you see mold on them.   °· Keep your house clean and dust free.   °· Replace carpet with wood, tile, or vinyl flooring. Carpet can trap dander and dust.   °· Use allergy-proof pillows, mattress covers, and box spring covers.   °· Wash bed sheets and blankets every week in hot water and dry them in a dryer.   °· Use blankets that are made of polyester or cotton.   °· Wash hands frequently. °SEEK MEDICAL CARE IF:  °· You have muscle aches.   °· You have chest pain.   °· The sputum changes from clear or white to yellow, green, gray, or bloody.   °· The sputum you cough up gets thicker.   °· There are problems that may be related to the medicine you are given, such as a rash, itching, swelling, or trouble breathing.   °SEEK IMMEDIATE MEDICAL CARE IF:  °· You have worsening wheezing and coughing even after taking your prescribed medicines.   °· You have increased difficulty breathing.   °· You develop severe   chest pain. °MAKE SURE YOU:  °· Understand these instructions. °· Will watch your condition. °· Will get help right away if you are not doing well or get worse. °  °This information is not intended to replace advice given to you by your health care provider. Make sure you discuss any questions you have with your health care provider. °  °Document Released: 02/02/2003 Document Revised: 02/20/2014 Document Reviewed: 07/22/2012 °Elsevier Interactive Patient Education ©2016 [employer] ° °

## 2015-02-09 ENCOUNTER — Other Ambulatory Visit: Payer: Self-pay | Admitting: Family Medicine

## 2015-02-09 NOTE — Telephone Encounter (Signed)
Your patient 

## 2015-02-10 ENCOUNTER — Inpatient Hospital Stay
Admission: EM | Admit: 2015-02-10 | Discharge: 2015-02-12 | DRG: 190 | Disposition: A | Discharge status: Home Health | Payer: Commercial Managed Care - HMO | Attending: Internal Medicine | Admitting: Internal Medicine

## 2015-02-10 ENCOUNTER — Emergency Department: Payer: Commercial Managed Care - HMO

## 2015-02-10 ENCOUNTER — Encounter: Payer: Self-pay | Admitting: Emergency Medicine

## 2015-02-10 DIAGNOSIS — Z888 Allergy status to other drugs, medicaments and biological substances status: Secondary | ICD-10-CM

## 2015-02-10 DIAGNOSIS — E875 Hyperkalemia: Secondary | ICD-10-CM | POA: Diagnosis not present

## 2015-02-10 DIAGNOSIS — Z7982 Long term (current) use of aspirin: Secondary | ICD-10-CM

## 2015-02-10 DIAGNOSIS — Z6841 Body Mass Index (BMI) 40.0 and over, adult: Secondary | ICD-10-CM

## 2015-02-10 DIAGNOSIS — J441 Chronic obstructive pulmonary disease with (acute) exacerbation: Secondary | ICD-10-CM | POA: Diagnosis not present

## 2015-02-10 DIAGNOSIS — E559 Vitamin D deficiency, unspecified: Secondary | ICD-10-CM | POA: Diagnosis present

## 2015-02-10 DIAGNOSIS — J9601 Acute respiratory failure with hypoxia: Secondary | ICD-10-CM | POA: Diagnosis present

## 2015-02-10 DIAGNOSIS — F17210 Nicotine dependence, cigarettes, uncomplicated: Secondary | ICD-10-CM | POA: Diagnosis present

## 2015-02-10 DIAGNOSIS — R0602 Shortness of breath: Secondary | ICD-10-CM

## 2015-02-10 DIAGNOSIS — N183 Chronic kidney disease, stage 3 (moderate): Secondary | ICD-10-CM | POA: Diagnosis present

## 2015-02-10 DIAGNOSIS — F419 Anxiety disorder, unspecified: Secondary | ICD-10-CM | POA: Diagnosis present

## 2015-02-10 DIAGNOSIS — K219 Gastro-esophageal reflux disease without esophagitis: Secondary | ICD-10-CM | POA: Diagnosis not present

## 2015-02-10 DIAGNOSIS — I129 Hypertensive chronic kidney disease with stage 1 through stage 4 chronic kidney disease, or unspecified chronic kidney disease: Secondary | ICD-10-CM | POA: Diagnosis present

## 2015-02-10 DIAGNOSIS — Z9049 Acquired absence of other specified parts of digestive tract: Secondary | ICD-10-CM

## 2015-02-10 DIAGNOSIS — Z79899 Other long term (current) drug therapy: Secondary | ICD-10-CM | POA: Diagnosis not present

## 2015-02-10 DIAGNOSIS — Z720 Tobacco use: Secondary | ICD-10-CM | POA: Diagnosis not present

## 2015-02-10 DIAGNOSIS — I10 Essential (primary) hypertension: Secondary | ICD-10-CM | POA: Diagnosis not present

## 2015-02-10 DIAGNOSIS — Z7952 Long term (current) use of systemic steroids: Secondary | ICD-10-CM | POA: Diagnosis not present

## 2015-02-10 DIAGNOSIS — F329 Major depressive disorder, single episode, unspecified: Secondary | ICD-10-CM | POA: Diagnosis not present

## 2015-02-10 LAB — CBC WITH DIFFERENTIAL/PLATELET
Basophils Absolute: 0.1 10*3/uL (ref 0–0.1)
Basophils Relative: 1 %
Eosinophils Absolute: 0.2 10*3/uL (ref 0–0.7)
Eosinophils Relative: 2 %
HCT: 42.4 % (ref 35.0–47.0)
HEMOGLOBIN: 13.8 g/dL (ref 12.0–16.0)
LYMPHS ABS: 1.7 10*3/uL (ref 1.0–3.6)
LYMPHS PCT: 17 %
MCH: 30.5 pg (ref 26.0–34.0)
MCHC: 32.4 g/dL (ref 32.0–36.0)
MCV: 94 fL (ref 80.0–100.0)
MONOS PCT: 7 %
Monocytes Absolute: 0.7 10*3/uL (ref 0.2–0.9)
NEUTROS PCT: 73 %
Neutro Abs: 7.1 10*3/uL — ABNORMAL HIGH (ref 1.4–6.5)
PLATELETS: 250 10*3/uL (ref 150–440)
RBC: 4.51 MIL/uL (ref 3.80–5.20)
RDW: 15 % — ABNORMAL HIGH (ref 11.5–14.5)
WBC: 9.6 10*3/uL (ref 3.6–11.0)

## 2015-02-10 LAB — COMPREHENSIVE METABOLIC PANEL
ALBUMIN: 3.9 g/dL (ref 3.5–5.0)
ALT: 21 U/L (ref 14–54)
AST: 14 U/L — AB (ref 15–41)
Alkaline Phosphatase: 85 U/L (ref 38–126)
Anion gap: 9 (ref 5–15)
BUN: 23 mg/dL — AB (ref 6–20)
CO2: 25 mmol/L (ref 22–32)
CREATININE: 1.48 mg/dL — AB (ref 0.44–1.00)
Calcium: 9.1 mg/dL (ref 8.9–10.3)
Chloride: 107 mmol/L (ref 101–111)
GFR calc Af Amer: 39 mL/min — ABNORMAL LOW (ref >60)
GFR calc non Af Amer: 34 mL/min — ABNORMAL LOW (ref >60)
GLUCOSE: 136 mg/dL — AB (ref 65–99)
Potassium: 4.3 mmol/L (ref 3.5–5.1)
Sodium: 141 mmol/L (ref 135–145)
TOTAL PROTEIN: 7.7 g/dL (ref 6.5–8.1)
Total Bilirubin: 0.5 mg/dL (ref 0.3–1.2)

## 2015-02-10 LAB — TROPONIN I: Troponin I: <0.03 ng/mL (ref <0.031)

## 2015-02-10 LAB — MAGNESIUM: Magnesium: 2 mg/dL (ref 1.7–2.4)

## 2015-02-10 LAB — LIPASE, BLOOD: Lipase: 40 U/L (ref 11–51)

## 2015-02-10 MED ORDER — PANTOPRAZOLE SODIUM 40 MG PO TBEC
40.0000 mg | DELAYED_RELEASE_TABLET | Freq: Every day | ORAL | Status: DC
  Administered 2015-02-11: 40 mg via ORAL
  Filled 2015-02-10: qty 1

## 2015-02-10 MED ORDER — ACETAMINOPHEN 650 MG RE SUPP: 650.0000 mg | Freq: Four times a day (QID) | RECTAL | Status: DC | PRN

## 2015-02-10 MED ORDER — SODIUM CHLORIDE 0.9 % IV BOLUS (SEPSIS)
500.0000 mL | INTRAVENOUS | Status: AC
  Administered 2015-02-10: 500 mL via INTRAVENOUS

## 2015-02-10 MED ORDER — ONDANSETRON HCL 4 MG/2ML IJ SOLN: 4.0000 mg | Freq: Four times a day (QID) | INTRAMUSCULAR | Status: DC | PRN

## 2015-02-10 MED ORDER — HYDRALAZINE HCL 25 MG PO TABS
25.0000 mg | ORAL_TABLET | Freq: Two times a day (BID) | ORAL | Status: DC
  Administered 2015-02-10 – 2015-02-12 (×4): 25 mg via ORAL
  Filled 2015-02-10 (×4): qty 1

## 2015-02-10 MED ORDER — ASPIRIN EC 81 MG PO TBEC
81.0000 mg | DELAYED_RELEASE_TABLET | Freq: Every day | ORAL | Status: DC
  Administered 2015-02-11 – 2015-02-12 (×2): 81 mg via ORAL
  Filled 2015-02-10 (×2): qty 1

## 2015-02-10 MED ORDER — MORPHINE SULFATE (PF) 2 MG/ML IV SOLN: 2.0000 mg | INTRAVENOUS | Status: DC | PRN

## 2015-02-10 MED ORDER — SODIUM CHLORIDE 0.9 % IV SOLN
INTRAVENOUS | Status: DC
  Administered 2015-02-10: 23:00:00 via INTRAVENOUS

## 2015-02-10 MED ORDER — HEPARIN SODIUM (PORCINE) 5000 UNIT/ML IJ SOLN
5000.0000 [IU] | Freq: Three times a day (TID) | INTRAMUSCULAR | Status: DC
  Administered 2015-02-10 – 2015-02-12 (×5): 5000 [IU] via SUBCUTANEOUS
  Filled 2015-02-10 (×5): qty 1

## 2015-02-10 MED ORDER — METHYLPREDNISOLONE SODIUM SUCC 125 MG IJ SOLR
125.0000 mg | Freq: Once | INTRAMUSCULAR | Status: AC
  Administered 2015-02-10: 125 mg via INTRAVENOUS
  Filled 2015-02-10: qty 2

## 2015-02-10 MED ORDER — METHYLPREDNISOLONE SODIUM SUCC 125 MG IJ SOLR
60.0000 mg | INTRAMUSCULAR | Status: DC
  Administered 2015-02-11 – 2015-02-12 (×2): 60 mg via INTRAVENOUS
  Filled 2015-02-10 (×2): qty 2

## 2015-02-10 MED ORDER — IPRATROPIUM-ALBUTEROL 0.5-2.5 (3) MG/3ML IN SOLN: 3.0000 mL | RESPIRATORY_TRACT | Status: DC | PRN

## 2015-02-10 MED ORDER — IPRATROPIUM-ALBUTEROL 0.5-2.5 (3) MG/3ML IN SOLN
9.0000 mL | Freq: Once | RESPIRATORY_TRACT | Status: AC
Start: 2015-02-10 — End: 2015-02-10
  Administered 2015-02-10: 9 mL via RESPIRATORY_TRACT
  Filled 2015-02-10: qty 9

## 2015-02-10 MED ORDER — DEXTROSE 5 % IV SOLN
500.0000 mg | INTRAVENOUS | Status: DC
  Administered 2015-02-10 – 2015-02-11 (×2): 500 mg via INTRAVENOUS
  Filled 2015-02-10 (×3): qty 500

## 2015-02-10 MED ORDER — ONDANSETRON HCL 4 MG PO TABS: 4.0000 mg | ORAL_TABLET | Freq: Four times a day (QID) | ORAL | Status: DC | PRN

## 2015-02-10 MED ORDER — ACETAMINOPHEN 325 MG PO TABS: 650.0000 mg | ORAL_TABLET | Freq: Four times a day (QID) | ORAL | Status: DC | PRN

## 2015-02-10 MED ORDER — FLUOXETINE HCL 10 MG PO CAPS
10.0000 mg | ORAL_CAPSULE | Freq: Every day | ORAL | Status: DC
  Administered 2015-02-11 – 2015-02-12 (×2): 10 mg via ORAL
  Filled 2015-02-10 (×2): qty 1

## 2015-02-10 MED ORDER — OXYCODONE HCL 5 MG PO TABS: 5.0000 mg | ORAL_TABLET | ORAL | Status: DC | PRN

## 2015-02-10 MED ORDER — LORATADINE 10 MG PO TABS
10.0000 mg | ORAL_TABLET | Freq: Every day | ORAL | Status: DC
  Administered 2015-02-11 – 2015-02-12 (×2): 10 mg via ORAL
  Filled 2015-02-10 (×2): qty 1

## 2015-02-10 MED ORDER — VITAMIN D 1000 UNITS PO TABS
1000.0000 [IU] | ORAL_TABLET | Freq: Every day | ORAL | Status: DC
  Administered 2015-02-11 – 2015-02-12 (×2): 1000 [IU] via ORAL
  Filled 2015-02-10 (×2): qty 1

## 2015-02-10 NOTE — Progress Notes (Signed)
Attempted bipap on this patient. Patient could not tolerate after 2 minutes. svn started. Reported findings to Dr Karma Greaser

## 2015-02-10 NOTE — Telephone Encounter (Signed)
Patient has an appt tomorrow Rx approved

## 2015-02-10 NOTE — ED Notes (Signed)
Pt arrived from home with C/O SOB. Pt shows labored breathing and has O2 of 83% RA. Pt stats HX of COPD and reports she is still smoking. Pt put on 2L of O2 with results of 99% [place].

## 2015-02-10 NOTE — H&P (Signed)
[facility] [place] NAME: Katherine [name]    MR#:  [id number]  DATE OF BIRTH:  February 02, 1941   DATE OF ADMISSION:  02/10/2015  PRIMARY CARE PHYSICIAN: Suzie Portela, FNP   REQUESTING/REFERRING PHYSICIAN: Karma Greaser  CHIEF COMPLAINT:   Chief Complaint  Patient presents with  . Shortness of Breath    HISTORY OF PRESENT ILLNESS:  Katherine [name]  is a 74 y.o. female with a known history of COPD and non-oxygen dependent who is presenting with shortness of breath. She describes progressively worsening shortness of breath originally has dyspnea on exertion with associated cough productive of whitish sputum denies any fevers or chills. Of note she was recently discharged from the hospital for similar complaints states that she began worsening after discharge. Upon arrival to the emergency department she was noted be in distress saturating in the high 80s on room air and recently tachypneic. She however refused BiPAP therapy but did improve after some DuoNeb treatments.  PAST MEDICAL HISTORY:   Past Medical History  Diagnosis Date  . Anxiety and depression   . Tobacco abuse   . Angioedema   . Gallstones   . Prurigo nodularis   . Anxiety   . Hypertension   . GERD (gastroesophageal reflux disease)   . COPD (chronic obstructive pulmonary disease) ([place])   . Osteoarthritis   . Vitamin D deficiency disease   . Chronic constipation   . Chronic kidney disease     stage 3  . Left ventricular hypertrophy     PAST SURGICAL HISTORY:   Past Surgical History  Procedure Laterality Date  . Cholecystectomy    . Polypectomy  11/2011    vocal cord    SOCIAL HISTORY:   Social History  Substance Use Topics  . Smoking status: Current Some Day Smoker -- 0.50 packs/day for 52 years    Types: Cigarettes  . Smokeless tobacco: Never Used  . Alcohol Use: No     Comment: rare    FAMILY HISTORY:   Family History  Problem Relation Age of  Onset  . Alcohol abuse Mother     DRUG ALLERGIES:   Allergies  Allergen Reactions  . Enalapril Maleate Swelling    REVIEW OF SYSTEMS:  REVIEW OF SYSTEMS:  CONSTITUTIONAL: Denies fevers, chills, positive fatigue, weakness.  EYES: Denies blurred vision, double vision, or eye pain.  EARS, NOSE, THROAT: Denies tinnitus, ear pain, hearing loss.  RESPIRATORY: Positive cough, shortness of breath, wheezing  CARDIOVASCULAR: Denies chest pain, palpitations, edema.  GASTROINTESTINAL: Denies nausea, vomiting, diarrhea, abdominal pain.  GENITOURINARY: Denies dysuria, hematuria.  ENDOCRINE: Denies nocturia or thyroid problems. HEMATOLOGIC AND LYMPHATIC: Denies easy bruising or bleeding.  SKIN: Denies rash or lesions.  MUSCULOSKELETAL: Denies pain in neck, back, shoulder, knees, hips, or further arthritic symptoms.  NEUROLOGIC: Denies paralysis, paresthesias.  PSYCHIATRIC: Denies anxiety or depressive symptoms. Otherwise full review of systems performed by me is negative.   MEDICATIONS AT HOME:   Prior to Admission medications   Medication Sig Start Date End Date Taking? Authorizing Provider  acetaminophen (TYLENOL) 650 MG CR tablet Take 650-1,300 mg by mouth every 8 (eight) hours as needed for pain.    Yes Historical Provider, MD  albuterol (PROVENTIL HFA) 108 (90 BASE) MCG/ACT inhaler Inhale 2 puffs into the lungs every 4 (four) hours as needed for wheezing or shortness of breath. 02/02/15  Yes Carrie Mew, MD  aspirin EC 81 MG tablet Take 81 mg by mouth  daily.   Yes Historical Provider, MD  cetirizine (ZYRTEC) 10 MG tablet Take 5 mg by mouth daily.   Yes Historical Provider, MD  cholecalciferol (VITAMIN D) 1000 units tablet Take 1,000 Units by mouth daily.   Yes Historical Provider, MD  FLUoxetine (PROZAC) 10 MG capsule Take 10 mg by mouth daily.   Yes Historical Provider, MD  hydrALAZINE (APRESOLINE) 25 MG tablet Take 1 tablet (25 mg total) by mouth 2 (two) times daily. 08/31/14  Yes  Arnetha Courser, MD  omeprazole (PRILOSEC) 40 MG capsule Take 40 mg by mouth daily.   Yes Historical Provider, MD  benzonatate (TESSALON) 100 MG capsule Take 1 capsule (100 mg total) by mouth 3 (three) times daily as needed for cough. Patient not taking: Reported on 02/10/2015 09/18/14   Aldean Jewett, MD  metoprolol tartrate (LOPRESSOR) 25 MG tablet Take 0.5 tablets (12.5 mg total) by mouth 2 (two) times daily. Patient not taking: Reported on 02/10/2015 09/18/14   Aldean Jewett, MD  predniSONE (DELTASONE) 20 MG tablet Take 2 tablets (40 mg total) by mouth daily. Patient not taking: Reported on 02/10/2015 02/02/15   Carrie Mew, MD  traMADol (ULTRAM) 50 MG tablet Take 1 tablet (50 mg total) by mouth every 6 (six) hours as needed. Patient not taking: Reported on 02/10/2015 09/16/14   Loney Hering, MD  triamcinolone cream (KENALOG) 0.1 % Apply 1 application topically 2 (two) times daily as needed. Patient not taking: Reported on 02/10/2015 08/12/14   Arnetha Courser, MD      VITAL SIGNS:  Blood pressure 143/96, pulse 94, temperature 98.3 F (36.8 C), temperature source Oral, resp. rate 22, height 5\' 2"  (1.575 m), weight 230 lb (104.327 kg), SpO2 97 %.  PHYSICAL EXAMINATION:  VITAL SIGNS: Filed Vitals:   02/10/15 2115 02/10/15 2130  BP: 148/93 143/96  Pulse: 94 94  Temp:    Resp: 67 22   GENERAL:74 y.o.female currently in no acute distress.  HEAD: Normocephalic, atraumatic.  EYES: Pupils equal, round, reactive to light. Extraocular muscles intact. No scleral icterus.  MOUTH: Moist mucosal membrane. Dentition poor. No abscess noted.  EAR, NOSE, THROAT: Clear without exudates. No external lesions.  NECK: Supple. No thyromegaly. No nodules. No JVD.  PULMONARY: Greatly diminished breath sounds throughout all lung fields with associated expiratory wheezes most prominent right upper and lower lung fields. Tachypneic but No use of accessory muscles, Good respiratory effort. Poor air  entry bilaterally CHEST: Nontender to palpation.  CARDIOVASCULAR: S1 and S2. Regular rate and rhythm. No murmurs, rubs, or gallops. No edema. Pedal pulses 2+ bilaterally.  GASTROINTESTINAL: Soft, nontender, nondistended. No masses. Positive bowel sounds. No hepatosplenomegaly.  MUSCULOSKELETAL: No swelling, clubbing, or edema. Range of motion full in all extremities.  NEUROLOGIC: Cranial nerves II through XII are intact. No gross focal neurological deficits. Sensation intact. Reflexes intact.  SKIN: No ulceration, lesions, rashes, or cyanosis. Skin warm and dry. Turgor intact.  PSYCHIATRIC: Mood, affect within normal limits. The patient is awake, alert and oriented x 3. Insight, judgment intact.    LABORATORY PANEL:   CBC  Recent Labs Lab 02/10/15 1948  WBC 9.6  HGB 13.8  HCT 42.4  PLT 250   ------------------------------------------------------------------------------------------------------------------  Chemistries   Recent Labs Lab 02/10/15 1948  NA 141  K 4.3  CL 107  CO2 25  GLUCOSE 136*  BUN 23*  CREATININE 1.48*  CALCIUM 9.1  MG 2.0  AST 14*  ALT 21  ALKPHOS 85  BILITOT 0.5   ------------------------------------------------------------------------------------------------------------------  Cardiac Enzymes  Recent Labs Lab 02/10/15 1948  TROPONINI <0.03   ------------------------------------------------------------------------------------------------------------------  RADIOLOGY:  Dg Chest Portable 1 View  02/10/2015  CLINICAL DATA:  Shortness of Breath EXAM: PORTABLE CHEST - 1 VIEW COMPARISON:  02/02/2015 FINDINGS: The heart size and mediastinal contours are within normal limits. Both lungs are clear. The visualized skeletal structures are unremarkable. IMPRESSION: No active disease. Electronically Signed   By: [name] Catalina M.D.   On: 02/10/2015 20:05    EKG:   Orders placed or performed during the hospital encounter of 02/10/15  . EKG 12-Lead  .  EKG 12-Lead    IMPRESSION AND PLAN:   74 year old African-American female history of COPD presenting with shortness of breath  1. Chronic obstructive pulmonary disease exacerbation: Provide DuoNeb treatments q. 4 hours, Solu-Medrol 60 mg IV q. daily, add azithromycin. Continue with home medications.  2. GERD without esophagitis PPI therapy 3. Essential hypertension: Hydralazine, metoprolol 4. Venous thromboembolism prophylactic: Heparin subcutaneous     All the records are reviewed and case discussed with ED provider. Management plans discussed with the patient, family and they are in agreement.  CODE STATUS: Full  TOTAL TIME TAKING CARE OF THIS PATIENT: 35 minutes.    Hower,  Karenann Cai.D on 02/10/2015 at 10:01 PM  Between 7am to 6pm - Pager - [phone]  After 6pm: House Pager: - [phone]  [facility]  Office  [phone]  CC: Primary care physician; Suzie Portela, FNP

## 2015-02-10 NOTE — ED Provider Notes (Signed)
[facility] Emergency Department Provider Note  ____________________________________________  Time seen: Approximately 19:45 PM  I have reviewed the triage vital signs and the nursing notes.   HISTORY  Chief Complaint Shortness of Breath  History is somewhat limited by the severity of her respiratory distress  HPI Katherine [name] is a 74 y.o. female with a history of persistent tobacco abuse, COPD, and morbid obesity as well as anxiety who presents with severe respiratory distress.  She states that she was seen in the emergency department about 8 days ago with similar symptoms and discharged.  She completed 4 days of prednisone and has been using her inhaler.  However today, starting this morning, she had severely worse shortness of breath.  Any amount of exertion makes it significantly worse and nothing makes it better including her treatments.  She presents nearly tripoding, using accessory muscles, gasping for breath, and extremely anxious.her room air oxygen saturation was 83% upon arrival of the heart rate in the 120s and respiratory rate in the 30s. Her symptoms are severe and nothing is helping her.  She expresses the feeling of chest pressure and discomfort in addition to the shortness of breath. She denies nausea and vomiting as well as abdominal pain. She denies any recent fever or chills. She continues to smoke cigarettes daily.   Past Medical History  Diagnosis Date  . Anxiety and depression   . Tobacco abuse   . Angioedema   . Gallstones   . Prurigo nodularis   . Anxiety   . Hypertension   . GERD (gastroesophageal reflux disease)   . COPD (chronic obstructive pulmonary disease) ([place])   . Osteoarthritis   . Vitamin D deficiency disease   . Chronic constipation   . Chronic kidney disease     stage 3  . Left ventricular hypertrophy     Patient Active Problem List   Diagnosis Date Noted  . Chest pain 09/16/2014  . Acute respiratory failure  with hypoxia ([place]) 09/16/2014  . 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 Anxiety   . Tobacco abuse   . Prurigo nodularis   . Angioedema   . Hypertensive heart disease   . GERD (gastroesophageal reflux disease)   . COPD (chronic obstructive pulmonary disease) ([place])   . Osteoarthritis   . Vitamin D deficiency disease   . Chronic constipation   . Chronic kidney disease   . SOB (shortness of breath) 05/20/2013  . Right foot pain 05/20/2013  . LVH (left ventricular hypertrophy) 05/20/2013  . Essential hypertension 05/20/2013  . Morbid obesity ([place]) 05/20/2013    Past Surgical History  Procedure Laterality Date  . Cholecystectomy    . Polypectomy  11/2011    vocal cord    Current Outpatient Rx  Name  Route  Sig  Dispense  Refill  . acetaminophen (TYLENOL) 650 MG CR tablet   Oral   Take 650-1,300 mg by mouth every 8 (eight) hours as needed for pain.          [name] albuterol (PROVENTIL HFA) 108 (90 BASE) MCG/ACT inhaler   Inhalation   Inhale 2 puffs into the lungs every 4 (four) hours as needed for wheezing or shortness of breath.   1 Inhaler   0   . aspirin EC 81 MG tablet   Oral   Take 81 mg by mouth daily.         . cetirizine (ZYRTEC) 10 MG tablet   Oral   Take 5 mg by mouth daily.         [name]  cholecalciferol (VITAMIN D) 1000 units tablet   Oral   Take 1,000 Units by mouth daily.         [name] FLUoxetine (PROZAC) 10 MG capsule   Oral   Take 10 mg by mouth daily.         . hydrALAZINE (APRESOLINE) 25 MG tablet   Oral   Take 1 tablet (25 mg total) by mouth 2 (two) times daily.   60 tablet   5   . omeprazole (PRILOSEC) 40 MG capsule   Oral   Take 40 mg by mouth daily.         . benzonatate (TESSALON) 100 MG capsule   Oral   Take 1 capsule (100 mg total) by mouth 3 (three) times daily as needed for cough. Patient not taking: Reported on 02/10/2015   20 capsule   0   . metoprolol tartrate (LOPRESSOR) 25 MG tablet   Oral   Take 0.5 tablets (12.5 mg  total) by mouth 2 (two) times daily. Patient not taking: Reported on 02/10/2015   60 tablet   0   . predniSONE (DELTASONE) 20 MG tablet   Oral   Take 2 tablets (40 mg total) by mouth daily. Patient not taking: Reported on 02/10/2015   8 tablet   0   . traMADol (ULTRAM) 50 MG tablet   Oral   Take 1 tablet (50 mg total) by mouth every 6 (six) hours as needed. Patient not taking: Reported on 02/10/2015   12 tablet   0   . triamcinolone cream (KENALOG) 0.1 %   Topical   Apply 1 application topically 2 (two) times daily as needed. Patient not taking: Reported on 02/10/2015   45 g   2     Allergies Enalapril maleate  Family History  Problem Relation Age of Onset  . Alcohol abuse Mother     Social History Social History  Substance Use Topics  . Smoking status: Current Some Day Smoker -- 0.50 packs/day for 52 years    Types: Cigarettes  . Smokeless tobacco: Never Used  . Alcohol Use: No     Comment: rare    Review of Systems Constitutional: No fever/chills Eyes: No visual changes. ENT: No sore throat. Cardiovascular: chest discomfort along with the shortness of breath Respiratory: severe shortness of breath. Gastrointestinal: No abdominal pain.  No nausea, no vomiting.  No diarrhea.  No constipation. Genitourinary: Negative for dysuria. Musculoskeletal: Negative for back pain. Skin: Negative for rash. Neurological: Negative for headaches, focal weakness or numbness. Psych:  anxious 10-point ROS otherwise negative.  ____________________________________________   PHYSICAL EXAM:  VITAL SIGNS: ED Triage Vitals  Enc Vitals Group     BP 02/10/15 2045 127/83 mmHg     Pulse Rate 02/10/15 1937 119     Resp 02/10/15 1937 24     Temp 02/10/15 1937 98.3 F (36.8 C)     Temp Source 02/10/15 1937 Oral     SpO2 02/10/15 1937 88 %     Weight 02/10/15 1941 230 lb (104.327 kg)     Height 02/10/15 1941 5\' 2"  (1.575 m)     Head Cir --      Peak Flow --      Pain  Score --      Pain Loc --      Pain Edu? --      Excl. in [place]? --     Constitutional: Alert and oriented. Severe respiratory distress with accessory muscle usage and try  potting Eyes: Conjunctivae are normal. PERRL. EOMI. Head: Atraumatic. Nose: No congestion/rhinnorhea. Mouth/Throat: Mucous membranes are dry.  Oropharynx non-erythematous. Neck: No stridor.   Cardiovascular: tachycardia, regular rhythm. Grossly normal heart sounds.  Good peripheral circulation. Respiratory: greatly increased respiratory effort with accessory muscle usage, tachypnea, tripoding, and pursing of the lips. Air movement is diminished in all lung fields with expiratory wheezes. Gastrointestinal: Soft and nontender. No distention. No abdominal bruits. No CVA tenderness. Musculoskeletal: No lower extremity tenderness nor edema.  No joint effusions. Neurologic:  Normal speech and language. No gross focal neurologic deficits are appreciated.  Skin:  Skin is warm, dry and intact. No rash noted.   ____________________________________________   LABS (all labs ordered are listed, but only abnormal results are displayed)  Labs Reviewed  CBC WITH DIFFERENTIAL/PLATELET - Abnormal; Notable for the following:    RDW 15.0 (*)    Neutro Abs 7.1 (*)    All other components within normal limits  COMPREHENSIVE METABOLIC PANEL - Abnormal; Notable for the following:    Glucose, Bld 136 (*)    BUN 23 (*)    Creatinine, Ser 1.48 (*)    AST 14 (*)    GFR calc non Af Amer 34 (*)    GFR calc Af Amer 39 (*)    All other components within normal limits  LIPASE, BLOOD  MAGNESIUM  TROPONIN I   ____________________________________________  EKG  ED ECG REPORT I, Sydney Hasten, the attending physician, personally viewed and interpreted this ECG.  Date: 02/11/2015 EKG Time: 19:40 Rate: 115 Rhythm: sinus tachycardia QRS Axis: normal Intervals: normal ST/T Wave abnormalities: Non-specific ST segment / T-wave changes, but  no evidence of acute ischemia. Conduction Disutrbances: none Narrative Interpretation: no evidence of acute ischemia  ____________________________________________  RADIOLOGY   Dg Chest Portable 1 View  02/10/2015  CLINICAL DATA:  Shortness of Breath EXAM: PORTABLE CHEST - 1 VIEW COMPARISON:  02/02/2015 FINDINGS: The heart size and mediastinal contours are within normal limits. Both lungs are clear. The visualized skeletal structures are unremarkable. IMPRESSION: No active disease. Electronically Signed   By: [name] Catalina M.D.   On: 02/10/2015 20:05    ____________________________________________   PROCEDURES  Procedure(s) performed: None  Critical Care performed: Yes, see critical care note(s)   CRITICAL CARE Performed by: Hinda Kehr   Total critical care time: 30 minutes  Critical care time was exclusive of separately billable procedures and treating other patients.  Critical care was necessary to treat or prevent imminent or life-threatening deterioration.  Critical care was time spent personally by me on the following activities: development of treatment plan with patient and/or surrogate as well as nursing, discussions with consultants, evaluation of patient's response to treatment, examination of patient, obtaining history from patient or surrogate, ordering and performing treatments and interventions, ordering and review of laboratory studies, ordering and review of radiographic studies, pulse oximetry and re-evaluation of patient's condition.  ____________________________________________   INITIAL IMPRESSION / ASSESSMENT AND PLAN / ED COURSE  Pertinent labs & imaging results that were available during my care of the patient were reviewed by me and considered in my medical decision making (see chart for details).  The patient presents in severe respiratory distress and hypoxemia. Her oxygen saturation came up to the mid to upper 90s on 2 L a nasal cannula but she  remains in severe distress and is getting tired after hours of such distress. She reports she is never used BiPAP before. In addition to maximal medical therapy with duo  nebs times three, Solu-Medrol 125 mg IV, and a small fluid bolus given her insensible losses, I called respiratory therapy and ask her to start the patient on BiPAP. Anticipate the patient will improve with these treatments but she will still require hospitalization for her refractory COPD exacerbation after recent outpatient treatment and her hypoxemia.   (Note that documentation was delayed due to multiple ED patients requiring immediate care.)   The patient was not able to tolerate BiPAP which only made her more anxious. However she began to improve after her nebulizer treatments and when I reassessed her she had a faint wheeze still present but was breathing much more comfortably and moving considerably more air. However she continues to purse her lips and you some accessory muscles. This is likely also due to her body habitus presenting an element of restrictive airway disease as well. I have spoken with the hospitalist who will admit her for further management of her respiratory failure with hypoxemia and COPD exacerbation. There is no indication for antibiotics at this time.  ____________________________________________  FINAL CLINICAL IMPRESSION(S) / ED DIAGNOSES  Final diagnoses:  COPD with acute exacerbation ([place])  Acute respiratory failure with hypoxia ([place])      NEW MEDICATIONS STARTED DURING THIS VISIT:  New Prescriptions   No medications on file     Hinda Kehr, MD 02/11/15 0201

## 2015-02-11 ENCOUNTER — Ambulatory Visit: Payer: Commercial Managed Care - HMO | Admitting: Family Medicine

## 2015-02-11 ENCOUNTER — Inpatient Hospital Stay: Payer: Commercial Managed Care - HMO

## 2015-02-11 ENCOUNTER — Encounter: Payer: Self-pay | Admitting: Radiology

## 2015-02-11 LAB — BASIC METABOLIC PANEL
ANION GAP: 4 — AB (ref 5–15)
BUN: 22 mg/dL — ABNORMAL HIGH (ref 6–20)
CALCIUM: 8.4 mg/dL — AB (ref 8.9–10.3)
CO2: 24 mmol/L (ref 22–32)
Chloride: 110 mmol/L (ref 101–111)
Creatinine, Ser: 1.41 mg/dL — ABNORMAL HIGH (ref 0.44–1.00)
GFR, EST AFRICAN AMERICAN: 42 mL/min — AB (ref >60)
GFR, EST NON AFRICAN AMERICAN: 36 mL/min — AB (ref >60)
Glucose, Bld: 162 mg/dL — ABNORMAL HIGH (ref 65–99)
Potassium: 5.2 mmol/L — ABNORMAL HIGH (ref 3.5–5.1)
Sodium: 138 mmol/L (ref 135–145)

## 2015-02-11 LAB — CBC
HCT: 37.4 % (ref 35.0–47.0)
HEMOGLOBIN: 12.2 g/dL (ref 12.0–16.0)
MCH: 30 pg (ref 26.0–34.0)
MCHC: 32.6 g/dL (ref 32.0–36.0)
MCV: 91.9 fL (ref 80.0–100.0)
Platelets: 208 10*3/uL (ref 150–440)
RBC: 4.07 MIL/uL (ref 3.80–5.20)
RDW: 15 % — ABNORMAL HIGH (ref 11.5–14.5)
WBC: 10.9 10*3/uL (ref 3.6–11.0)

## 2015-02-11 MED ORDER — SODIUM CHLORIDE 0.9 % IV SOLN
INTRAVENOUS | Status: AC
  Administered 2015-02-11 – 2015-02-12 (×2): via INTRAVENOUS

## 2015-02-11 MED ORDER — IOHEXOL 350 MG/ML SOLN
75.0000 mL | Freq: Once | INTRAVENOUS | Status: AC | PRN
  Administered 2015-02-11: 75 mL via INTRAVENOUS

## 2015-02-11 NOTE — Progress Notes (Signed)
Dr. Benjie Karvonen aware that patient has had 3 loose stools in the past 24 hours. Instructed to place rule-out c.diff orders.

## 2015-02-11 NOTE — Progress Notes (Addendum)
[facility] NAME: Katherine [name]    MR#:  [id number]  DATE OF BIRTH:  Jun 11, 1940  SUBJECTIVE:   Patient is still complaining of shortness of breath and does not wear oxygen at home. She does smoke. She has no wheezing. Denies fever or chills.  REVIEW OF SYSTEMS:    Review of Systems  Constitutional: Negative for fever, chills and malaise/fatigue.  HENT: Negative for sore throat.   Eyes: Negative for blurred vision.  Respiratory: Positive for shortness of breath. Negative for cough, hemoptysis and wheezing.   Cardiovascular: Negative for chest pain, palpitations and leg swelling.  Gastrointestinal: Negative for nausea, vomiting, abdominal pain, diarrhea and blood in stool.  Genitourinary: Negative for dysuria.  Musculoskeletal: Negative for back pain.  Neurological: Negative for dizziness, tremors and headaches.  Endo/Heme/Allergies: Does not bruise/bleed easily.    Tolerating Diet: Yes      DRUG ALLERGIES:   Allergies  Allergen Reactions  . Enalapril Maleate Swelling    VITALS:  Blood pressure 140/76, pulse 78, temperature 98 F (36.7 C), temperature source Oral, resp. rate 26, height 5\' 2"  (1.575 m), weight 108.591 kg (239 lb 6.4 oz), SpO2 91 %.  PHYSICAL EXAMINATION:   Physical Exam  Constitutional: She is oriented to person, place, and time and well-developed, well-nourished, and in no distress. No distress.  HENT:  Head: Normocephalic.  Eyes: No scleral icterus.  Neck: Normal range of motion. Neck supple. No JVD present. No tracheal deviation present.  Cardiovascular: Normal rate, regular rhythm and normal heart sounds.  Exam reveals no gallop and no friction rub.   No murmur heard. Pulmonary/Chest: Effort normal and breath sounds normal. No respiratory distress. She has no wheezes. She has no rales. She exhibits no tenderness.  Abdominal: Soft. Bowel sounds are normal. She exhibits no distension and  no mass. There is no tenderness. There is no rebound and no guarding.  Musculoskeletal: Normal range of motion. She exhibits no edema.  Neurological: She is alert and oriented to person, place, and time.  Skin: Skin is warm. No rash noted. No erythema.  Psychiatric: Affect and judgment normal.      LABORATORY PANEL:   CBC  Recent Labs Lab 02/11/15 0453  WBC 10.9  HGB 12.2  HCT 37.4  PLT 208   ------------------------------------------------------------------------------------------------------------------  Chemistries   Recent Labs Lab 02/10/15 1948 02/11/15 0453  NA 141 138  K 4.3 5.2*  CL 107 110  CO2 25 24  GLUCOSE 136* 162*  BUN 23* 22*  CREATININE 1.48* 1.41*  CALCIUM 9.1 8.4*  MG 2.0  --   AST 14*  --   ALT 21  --   ALKPHOS 85  --   BILITOT 0.5  --    ------------------------------------------------------------------------------------------------------------------  Cardiac Enzymes  Recent Labs Lab 02/10/15 1948  TROPONINI <0.03   ------------------------------------------------------------------------------------------------------------------  RADIOLOGY:  Dg Chest Portable 1 View  02/10/2015  CLINICAL DATA:  Shortness of Breath EXAM: PORTABLE CHEST - 1 VIEW COMPARISON:  02/02/2015 FINDINGS: The heart size and mediastinal contours are within normal limits. Both lungs are clear. The visualized skeletal structures are unremarkable. IMPRESSION: No active disease. Electronically Signed   By: [name] Catalina M.D.   On: 02/10/2015 20:05     ASSESSMENT AND PLAN:   74 year old female with a history of COPD and tobacco dependence who presented with shortness breath.  1. Acute hypoxic respiratory failure: It is thought that patient have COPD exacerbation. On my examination today  she still complaining shortness of breath but there is no wheezing and she has good air movement. I will order CT chest to evaluate for pulmonary emboli. Continue COPD exacerbation  treatment with IV SLUMedrol and DuoNeb's as well as azithromycin. Wean oxygen as tolerated.  2. Tobacco dependence: Patient is encouraged substance. Patient's counseled for 3 minutes. Patient does want to try to quit smoking.  3. Depression: Continue Prozac.  4. Hyperkalemia: Potassium is 5.2. Repeat BMP in a.m. Continue telemetry for today.    Management plans discussed with the patient and she is in agreement.  CODE STATUS: Full  TOTAL TIME TAKING CARE OF THIS PATIENT: 30 minutes.     POSSIBLE D/C 1-2 days, DEPENDING ON CLINICAL CONDITION.   Lemoine Goyne M.D on 02/11/2015 at 10:52 AM  Between 7am to 6pm - Pager - [phone] After 6pm go to [email or web address] - password [email or web address] [facility]  [facility]  Office  [phone]  CC: Primary care physician; Suzie Portela, FNP  Note: This dictation was prepared with Dragon dictation along with smaller phrase technology. Any transcriptional errors that result from this process are unintentional.

## 2015-02-11 NOTE — Care Management (Signed)
Patient presents from home with shortness of breath.  She is currently using 02 which is acute.  She is visibly short of breath with conversation.  She lives with her husband.  She does sometimes have transportation issues getting to MD appointments.  Discussed [employer] and will provide contact information to initiate an application.  Discussed the need to  assess for need of home 02.

## 2015-02-12 ENCOUNTER — Inpatient Hospital Stay: Payer: Commercial Managed Care - HMO

## 2015-02-12 ENCOUNTER — Encounter: Payer: Self-pay | Admitting: Radiology

## 2015-02-12 LAB — BASIC METABOLIC PANEL
Anion gap: 4 — ABNORMAL LOW (ref 5–15)
BUN: 27 mg/dL — AB (ref 6–20)
CHLORIDE: 110 mmol/L (ref 101–111)
CO2: 25 mmol/L (ref 22–32)
CREATININE: 1.41 mg/dL — AB (ref 0.44–1.00)
Calcium: 8.6 mg/dL — ABNORMAL LOW (ref 8.9–10.3)
GFR calc non Af Amer: 36 mL/min — ABNORMAL LOW (ref >60)
GFR, EST AFRICAN AMERICAN: 42 mL/min — AB (ref >60)
Glucose, Bld: 110 mg/dL — ABNORMAL HIGH (ref 65–99)
POTASSIUM: 5 mmol/L (ref 3.5–5.1)
SODIUM: 139 mmol/L (ref 135–145)

## 2015-02-12 MED ORDER — AZITHROMYCIN 250 MG PO TABS: 500.0000 mg | ORAL_TABLET | ORAL | Status: DC

## 2015-02-12 MED ORDER — AZITHROMYCIN 500 MG PO TABS: 500.0000 mg | ORAL_TABLET | Freq: Every day | ORAL | Status: AC

## 2015-02-12 MED ORDER — TIOTROPIUM BROMIDE MONOHYDRATE 18 MCG IN CAPS: 18.0000 ug | ORAL_CAPSULE | Freq: Every day | RESPIRATORY_TRACT | Status: DC

## 2015-02-12 MED ORDER — PREDNISONE 10 MG PO TABS: 10.0000 mg | ORAL_TABLET | Freq: Every day | ORAL | Status: DC

## 2015-02-12 MED ORDER — IOHEXOL 350 MG/ML SOLN
75.0000 mL | Freq: Once | INTRAVENOUS | Status: AC | PRN
  Administered 2015-02-12: 75 mL via INTRAVENOUS

## 2015-02-12 NOTE — Care Management Important Message (Signed)
Important Message  Patient Details  Name: Nyasha Woolf MRN: [id number] Date of Birth: 1940-05-08   Medicare Important Message Given:  Yes    Katrina Stack, RN 02/12/2015, 9:57 AM

## 2015-02-12 NOTE — Clinical Documentation Improvement (Signed)
Internal Medicine Please update your documentation within the medical record to reflect your response to this query. Thank you  Can the diagnosis of obesity be further specified and linked to BMI?   Identify Type - morbid obesity, obesity (link the BMI to condition e.g. morbid obesity with BMI of 47), overweight, with alveolar hypoventilation (Pickwickian Syndrome)  Document etiology of - drug induced (specify drug), due to excess calories, familial, endocrine  Other  Clinically Undetermined  Document any associated diagnoses/conditions.  Supporting Information: 02/10/15 ED note..."Morbid obesity ([place])".. 02/10/15 Flow sheet: Ht: 5'2", Wt: 230 lb; BMI 42.2  Please exercise your independent, professional judgment when responding. A specific answer is not anticipated or expected.  Thank You,  Ermelinda Das, RN, BSN, [place] Certified Clinical Documentation Specialist [place]: Health Information Management [phone]

## 2015-02-12 NOTE — Progress Notes (Signed)
Patient has rested quietly tonight. No complaints of pain and no signs of discomfort or distress noted. Patient hasn't had any stools, so stool sample for C-diff PCR still needed. Nursing staff will continue to monitor. Earleen Reaper, RN

## 2015-02-12 NOTE — Progress Notes (Signed)
Pt. Discharged to home with HHPT via wc with new walker in tow. Discharge instructions and medication regimen reviewed at bedside with patient and husband. Both verbalize understanding of instructions and medication regimen. Prescription for prednisone sent with patient, all others sent to pharmacy and pt is aware. Smoking cessation education reviewed with patient in detail. Patient assessment unchanged from this morning. TELE and IV discontinued per policy.

## 2015-02-12 NOTE — Progress Notes (Signed)
This RN ambulated with patient around nurses station x1, approx. 221ft with front wheel walker. Pt maintained RA exertional sats at 93% and above; tolerated fair: was slightly short of breath when returned to room, however patient reports this is normal for her.

## 2015-02-12 NOTE — Progress Notes (Addendum)
SATURATION QUALIFICATIONS: (This note is used to comply with regulatory documentation for home oxygen)  Patient Saturations on Room Air at Rest = 98%  Patient Saturations on Room Air while Ambulating = 93%  Patient Saturations on N/A Liters of oxygen while Ambulating = N/A%  Please briefly explain why patient needs home oxygen:

## 2015-02-12 NOTE — Evaluation (Signed)
Physical Therapy Evaluation Patient Details Name: Katherine [name] MRN: [id number] DOB: Jul 06, 1940 Today's Date: 02/12/2015   History of Present Illness  Patient is a pleasant 74 y/o female that presents with dyspnea on exertion and cough (productive). Negative for PE on 2 scans this admission.   Clinical Impression  Patient is a pleasant 74 y/o female that has typically been ambulating short distances with SPC recently. She denies any falls, however when provided with [facility] in this session she begins to reach for second source of hand hold assistance to improve balance. Patient provided with RW and demonstrates deficits in gait speed, however no balance deficits identified with RW. No significant desaturation in O2 noted, though patient reports her baseline level of dyspnea on exertion in this session. Patient would benefit from use of RW and HHPT to address her decline in endurance and balance to return to more independent ambulation. Skilled acute PT services are indicated to address the above impairments.     Follow Up Recommendations Home health PT    Equipment Recommendations  Rolling walker with 5" wheels    Recommendations for Other Services       Precautions / Restrictions Precautions Precautions: Fall Restrictions Weight Bearing Restrictions: No      Mobility  Bed Mobility Overal bed mobility: Independent             General bed mobility comments: No deficits in mobility.   Transfers Overall transfer level: Modified independent Equipment used: Rolling walker (2 wheeled)             General transfer comment: No deficits or loss of balance with RW for transfer. Appropriate speed noted.   Ambulation/Gait Ambulation/Gait assistance: Supervision Ambulation Distance (Feet): 200 Feet Assistive device: Rolling walker (2 wheeled) Gait Pattern/deviations: WFL(Within Functional Limits)   Gait velocity interpretation: Below normal speed for age/gender General Gait  Details: Patient initially attempts to ambulate with SPC, however she begins to hold on to bedrail, then one end of RW. PT opted to ambulate with RW for bilateral UE support. With RW, no balance deficits noted though shortness of breath noted with prolonged ambulation (per patient this is baseline).   Stairs            Wheelchair Mobility    Modified Rankin (Stroke Patients Only)       Balance Overall balance assessment: Modified Independent (Modified DGI of 10/12 with RW.)                                           Pertinent Vitals/Pain Pain Assessment: No/denies pain    Home Living Family/patient expects to be discharged to:: Private residence Living Arrangements: Spouse/significant other Available Help at Discharge: Family;Available 24 hours/day Type of Home: House Home Access: Level entry     Home Layout: One level Home Equipment: Cane - single point      Prior Function Level of Independence: Independent with assistive device(s)         Comments: Patient denies falls with household mobility with SPC.      Hand Dominance        Extremity/Trunk Assessment   Upper Extremity Assessment: Overall WFL for tasks assessed           Lower Extremity Assessment: Overall WFL for tasks assessed         Communication   Communication: No difficulties  Cognition Arousal/Alertness: Awake/alert Behavior During  Therapy: WFL for tasks assessed/performed Overall Cognitive Status: Within Functional Limits for tasks assessed                      General Comments      Exercises        Assessment/Plan    PT Assessment Patient needs continued PT services  PT Diagnosis Difficulty walking;Generalized weakness   PT Problem List Decreased strength;Decreased knowledge of use of DME;Decreased activity tolerance;Cardiopulmonary status limiting activity;Decreased balance  PT Treatment Interventions Gait training;Therapeutic  activities;Therapeutic exercise;Balance training   PT Goals (Current goals can be found in the Care Plan section) Acute Rehab PT Goals Patient Stated Goal: To return to home  PT Goal Formulation: With patient Time For Goal Achievement: 02/26/15 Potential to Achieve Goals: Good    Frequency Min 2X/week   Barriers to discharge        Co-evaluation               End of Session Equipment Utilized During Treatment: Gait belt Activity Tolerance: Patient tolerated treatment well Patient left: in bed;with bed alarm set;with call bell/phone within reach Nurse Communication: Mobility status         Time: [id number] PT Time Calculation (min) (ACUTE ONLY): 19 min   Charges:   PT Evaluation $Initial PT Evaluation Tier I: 1 Procedure     PT G Codes:       Kerman Passey, PT, DPT    02/12/2015, 2:19 PM

## 2015-02-12 NOTE — Progress Notes (Signed)
MD updated on CT chest results and that pt not requiring o2 at this time.

## 2015-02-12 NOTE — Discharge Summary (Signed)
[facility] [place] NAME: Katherine [name]    MR#:  [id number]  DATE OF BIRTH:  28-May-1940  DATE OF ADMISSION:  02/10/2015 ADMITTING PHYSICIAN: Lytle Butte, MD  DATE OF DISCHARGE: 02/12/2015  PRIMARY CARE PHYSICIAN: Golden Pop, MD    ADMISSION DIAGNOSIS:  Acute respiratory failure with hypoxia ([place]) [J96.01] COPD with acute exacerbation ([facility]) [J44.1]  DISCHARGE DIAGNOSIS:  Active Problems:   COPD exacerbation ([place])   SECONDARY DIAGNOSIS:   Past Medical History  Diagnosis Date  . Anxiety and depression   . Tobacco abuse   . Angioedema   . Gallstones   . Prurigo nodularis   . Anxiety   . Hypertension   . GERD (gastroesophageal reflux disease)   . COPD (chronic obstructive pulmonary disease) ([place])   . Osteoarthritis   . Vitamin D deficiency disease   . Chronic constipation   . Chronic kidney disease     stage 3  . Left ventricular hypertrophy     HOSPITAL COURSE:   74 year old female with history of COPD who presented with shortness of breath and acute exacerbation of COPD. For further details please refer the H&P.  1. Acute hypoxic respiratory failure: This is secondary to COPD. I did order a CT of the chest as the patient denies significant wheezing. CT chest was negative. She had no evidence of pneumonia. She is no longer requiring oxygen at discharge.  2. Acute COPD exacerbation: Patient was treated with IV steroids, DuoNeb's and inhalers. She is much improved. She will continue on antibiotic and steroid taper at discharge. 3. Depression: Continue Prozac  4. Morbid obesity: Encouraged weight loss as tolerated and diet and exercise.  DISCHARGE CONDITIONS AND DIET:  [name] home in stable condition on a heart healthy diet  CONSULTS OBTAINED:  Treatment Team:  Lytle Butte, MD  DRUG ALLERGIES:   Allergies  Allergen Reactions  . Enalapril Maleate Swelling    DISCHARGE  MEDICATIONS:   Current Discharge Medication List    START taking these medications   Details  azithromycin (ZITHROMAX) 500 MG tablet Take 1 tablet (500 mg total) by mouth daily. Qty: 4 tablet, Refills: 0    tiotropium (SPIRIVA HANDIHALER) 18 MCG inhalation capsule Place 1 capsule (18 mcg total) into inhaler and inhale daily. Qty: 30 capsule, Refills: 0      CONTINUE these medications which have CHANGED   Details  predniSONE (DELTASONE) 10 MG tablet  steroid taper       CONTINUE these medications which have NOT CHANGED   Details  acetaminophen (TYLENOL) 650 MG CR tablet Take 650-1,300 mg by mouth every 8 (eight) hours as needed for pain.     albuterol (PROVENTIL HFA) 108 (90 BASE) MCG/ACT inhaler Inhale 2 puffs into the lungs every 4 (four) hours as needed for wheezing or shortness of breath. Qty: 1 Inhaler, Refills: 0    aspirin EC 81 MG tablet Take 81 mg by mouth daily.    cetirizine (ZYRTEC) 10 MG tablet Take 5 mg by mouth daily.    cholecalciferol (VITAMIN D) 1000 units tablet Take 1,000 Units by mouth daily.    FLUoxetine (PROZAC) 10 MG capsule Take 10 mg by mouth daily.    hydrALAZINE (APRESOLINE) 25 MG tablet Take 1 tablet (25 mg total) by mouth 2 (two) times daily. Qty: 60 tablet, Refills: 5    omeprazole (PRILOSEC) 40 MG capsule Take 40 mg by mouth daily.    benzonatate (TESSALON)  100 MG capsule Take 1 capsule (100 mg total) by mouth 3 (three) times daily as needed for cough. Qty: 20 capsule, Refills: 0    metoprolol tartrate (LOPRESSOR) 25 MG tablet Take 0.5 tablets (12.5 mg total) by mouth 2 (two) times daily. Qty: 60 tablet, Refills: 0    traMADol (ULTRAM) 50 MG tablet Take 1 tablet (50 mg total) by mouth every 6 (six) hours as needed. Qty: 12 tablet, Refills: 0    triamcinolone cream (KENALOG) 0.1 % Apply 1 application topically 2 (two) times daily as needed. Qty: 45 g, Refills: 2              Today   CHIEF COMPLAINT:   Patient doing well  this point. No shortness breath or wheezing. Diarrhea has resolved.   VITAL SIGNS:  Blood pressure 150/73, pulse 61, temperature 98.3 F (36.8 C), temperature source Oral, resp. rate 19, height 5\' 2"  (1.575 m), weight 108.591 kg (239 lb 6.4 oz), SpO2 96 %.   REVIEW OF SYSTEMS:  Review of Systems  Constitutional: Negative for fever, chills and malaise/fatigue.  HENT: Negative for sore throat.   Eyes: Negative for blurred vision.  Respiratory: Negative for cough, hemoptysis, shortness of breath and wheezing.   Cardiovascular: Negative for chest pain, palpitations and leg swelling.  Gastrointestinal: Negative for nausea, vomiting, abdominal pain, diarrhea and blood in stool.  Genitourinary: Negative for dysuria.  Musculoskeletal: Negative for back pain.  Neurological: Negative for dizziness, tremors and headaches.  Endo/Heme/Allergies: Does not bruise/bleed easily.     PHYSICAL EXAMINATION:  GENERAL:  74 y.o.-year-old patient lying in the bed with no acute distress. Obese NECK:  Supple, no jugular venous distention. No thyroid enlargement, no tenderness.  LUNGS: Normal breath sounds bilaterally, no wheezing, rales,rhonchi  No use of accessory muscles of respiration.  CARDIOVASCULAR: S1, S2 normal. No murmurs, rubs, or gallops.  ABDOMEN: Soft, non-tender, non-distended. Bowel sounds present. No organomegaly or mass.  EXTREMITIES: No pedal edema, cyanosis, or clubbing.  PSYCHIATRIC: The patient is alert and oriented x 3.  SKIN: No obvious rash, lesion, or ulcer.   DATA REVIEW:   CBC  Recent Labs Lab 02/11/15 0453  WBC 10.9  HGB 12.2  HCT 37.4  PLT 208    Chemistries   Recent Labs Lab 02/10/15 1948  02/12/15 0507  NA 141  < > 139  K 4.3  < > 5.0  CL 107  < > 110  CO2 25  < > 25  GLUCOSE 136*  < > 110*  BUN 23*  < > 27*  CREATININE 1.48*  < > 1.41*  CALCIUM 9.1  < > 8.6*  MG 2.0  --   --   AST 14*  --   --   ALT 21  --   --   ALKPHOS 85  --   --   BILITOT 0.5   --   --   < > = values in this interval not displayed.  Cardiac Enzymes  Recent Labs Lab 02/10/15 1948  TROPONINI <0.03    Microbiology Results  @MICRORSLT48 @  RADIOLOGY:  Ct Angio Chest Pe W/cm &/or Wo Cm  02/12/2015  CLINICAL DATA:  Shortness of breath, repeat PE study due to nondiagnostic examination yesterday EXAM: CT ANGIOGRAPHY CHEST WITH CONTRAST TECHNIQUE: Multidetector CT imaging of the chest was performed using the standard protocol during bolus administration of intravenous contrast. Multiplanar CT image reconstructions and MIPs were obtained to evaluate the vascular anatomy. CONTRAST:  77mL OMNIPAQUE IOHEXOL 350  MG/ML SOLN COMPARISON:  CT chest dated 02/11/2015 and 09/16/2014 FINDINGS: No evidence of pulmonary embolism. Mediastinum/Nodes: The heart is normal in size. No pericardial effusion. Coronary atherosclerosis. Atherosclerotic calcifications aortic arch. Small mediastinal lymph nodes measuring up to 10 mm short axis. Visualized thyroid is unremarkable. Lungs/Pleura: Scattered areas of scarring in the medial right middle lobe, lingula, and bilateral lower lobes. No focal consolidation. Evaluation of the lung parenchyma is constrained by respiratory motion. No suspicious pulmonary nodules. Underlying mild to moderate centrilobular and paraseptal emphysematous changes. No pleural effusion or pneumothorax. Upper abdomen: Visualized upper abdomen is notable for cholecystectomy clips. Musculoskeletal: Degenerative changes of the visualized thoracolumbar spine. Mild wedging of a mid thoracic vertebral body. Review of the MIP images confirms the above findings. IMPRESSION: No evidence of pulmonary embolism. No evidence of acute cardiopulmonary disease. Electronically Signed   By: Julian Hy M.D.   On: 02/12/2015 11:26   Ct Angio Chest Pe W/cm &/or Wo Cm  02/11/2015  CLINICAL DATA:  Shortness of breath x2 days, COPD. EXAM: CT ANGIOGRAPHY CHEST WITH CONTRAST TECHNIQUE:  Multidetector CT imaging of the chest was performed using the standard protocol during bolus administration of intravenous contrast. Multiplanar CT image reconstructions and MIPs were obtained to evaluate the vascular anatomy. CONTRAST:  46mL OMNIPAQUE IOHEXOL 350 MG/ML SOLN COMPARISON:  CTA chest dated 09/16/2014 FINDINGS: Suboptimal/nondiagnostic evaluation for pulmonary embolism. Contrast is within the aorta and pulmonary veins at the time of imaging, but not within the pulmonary arteries. Given the patient's impaired renal function, repeat contrast bolus could not be administered due medially. Following discussion with the referring clinician, the patient will be hydrated and repeat imaging will be performed after 24 hours. Mediastinum/Nodes: Heart is normal in size. No pericardial effusion. Three vessel coronary atherosclerosis. Atherosclerotic calcifications of the aortic arch. Small mediastinal lymph nodes, including a 10 mm short axis AP window node (series 4/image 45), likely reactive. Visualized thyroid is unremarkable. Lungs/Pleura: Mild patchy/ platelike opacity in the left upper lobe/lingula (series 6/image 78), similar to prior CT, favored to reflect scarring or less likely atelectasis. Additional linear scarring/ atelectasis in the right middle lobe and left lower lobe. Underlying mild centrilobular and paraseptal emphysematous changes. No focal consolidation. No suspicious pulmonary nodules. No pleural effusion or pneumothorax. Upper abdomen: Visualized upper abdomen is notable for cholecystectomy clips. Musculoskeletal: Degenerative changes of the visualized thoracolumbar spine. Review of the MIP images confirms the above findings. IMPRESSION: Suboptimal/nondiagnostic evaluation pulmonary embolism, without contrast in the pulmonary arteries at the time of imaging. Following discussion with the referring clinician, given the patient's impaired renal function, repeat contrast-enhanced imaging will be  performed after 24 hours. Otherwise, no evidence of acute cardiopulmonary disease. Electronically Signed   By: Julian Hy M.D.   On: 02/11/2015 13:07   Dg Chest Portable 1 View  02/10/2015  CLINICAL DATA:  Shortness of Breath EXAM: PORTABLE CHEST - 1 VIEW COMPARISON:  02/02/2015 FINDINGS: The heart size and mediastinal contours are within normal limits. Both lungs are clear. The visualized skeletal structures are unremarkable. IMPRESSION: No active disease. Electronically Signed   By: [name] Catalina M.D.   On: 02/10/2015 20:05      Management plans discussed with the patient and she is in agreement. Stable for discharge home  Patient should follow up with PCP in one week  CODE STATUS:     Code Status Orders        Start     Ordered   02/10/15 2122  Full code  Continuous     02/10/15 2122      TOTAL TIME TAKING CARE OF THIS PATIENT: 35 minutes.    Note: This dictation was prepared with Dragon dictation along with smaller phrase technology. Any transcriptional errors that result from this process are unintentional.  Katherine [name] M.D on 02/12/2015 at 12:02 PM  Between 7am to 6pm - Pager - [phone] After 6pm go to [email or web address] - password [email or web address] [facility]  Office  [phone]  CC: Primary care physician; Golden Pop, MD

## 2015-02-12 NOTE — Progress Notes (Signed)
PHARMACIST - PHYSICIAN COMMUNICATION DR:   Benjie Karvonen CONCERNING: Antibiotic IV to Oral Route Change Policy  RECOMMENDATION: This patient is receiving azithromycin by the intravenous route.  Based on criteria approved by the Pharmacy and Therapeutics Committee, the antibiotic(s) is/are being converted to the equivalent oral dose form(s).   DESCRIPTION: These criteria include:  Patient being treated for a respiratory tract infection, urinary tract infection, cellulitis or clostridium difficile associated diarrhea if on metronidazole  The patient is not neutropenic and does not exhibit a GI malabsorption state  The patient is eating (either orally or via tube) and/or has been taking other orally administered medications for a least 24 hours  The patient is improving clinically and has a Tmax < 100.5  Darylene Price Lulie Hurd 9:21 AM

## 2015-02-12 NOTE — Progress Notes (Signed)
Dr. Benjie Karvonen and RNCM updated that PT recommends walker for patient.

## 2015-02-12 NOTE — Care Management (Signed)
Patient is agreeable with discharge home with home health nurse and physical therapy.  Agency preference- [facility] - in network with patient's insurance.  [facility] will provide walker.  Referral called to [place] with [facility].

## 2015-02-15 DIAGNOSIS — E559 Vitamin D deficiency, unspecified: Secondary | ICD-10-CM | POA: Diagnosis not present

## 2015-02-15 DIAGNOSIS — M6281 Muscle weakness (generalized): Secondary | ICD-10-CM | POA: Diagnosis not present

## 2015-02-15 DIAGNOSIS — L281 Prurigo nodularis: Secondary | ICD-10-CM | POA: Diagnosis not present

## 2015-02-15 DIAGNOSIS — N183 Chronic kidney disease, stage 3 (moderate): Secondary | ICD-10-CM | POA: Diagnosis not present

## 2015-02-15 DIAGNOSIS — I129 Hypertensive chronic kidney disease with stage 1 through stage 4 chronic kidney disease, or unspecified chronic kidney disease: Secondary | ICD-10-CM | POA: Diagnosis not present

## 2015-02-15 DIAGNOSIS — F418 Other specified anxiety disorders: Secondary | ICD-10-CM | POA: Diagnosis not present

## 2015-02-15 DIAGNOSIS — M1991 Primary osteoarthritis, unspecified site: Secondary | ICD-10-CM | POA: Diagnosis not present

## 2015-02-15 DIAGNOSIS — J441 Chronic obstructive pulmonary disease with (acute) exacerbation: Secondary | ICD-10-CM | POA: Diagnosis not present

## 2015-02-17 ENCOUNTER — Telehealth: Payer: Self-pay

## 2015-02-17 NOTE — Telephone Encounter (Signed)
I'm sorry, but I've not seen her in over 6 months; I have no idea what this is for; she will need an appointment to get an order for PT Okay to double-book her at the end of a morning if we don't have spots available for a while

## 2015-02-17 NOTE — Telephone Encounter (Signed)
They would like a verbal order for home P.T. To be done 3 times a week for 2 weeks and then 2 times a week for 1 week.

## 2015-02-18 NOTE — Telephone Encounter (Signed)
Left detailed message on identified voicemail.

## 2015-02-22 ENCOUNTER — Inpatient Hospital Stay: Payer: Commercial Managed Care - HMO | Admitting: Family Medicine

## 2015-03-08 ENCOUNTER — Encounter: Payer: Self-pay | Admitting: Family Medicine

## 2015-03-08 ENCOUNTER — Ambulatory Visit (INDEPENDENT_AMBULATORY_CARE_PROVIDER_SITE_OTHER): Payer: Commercial Managed Care - HMO | Admitting: Family Medicine

## 2015-03-08 VITALS — BP 159/94 | HR 95 | Temp 98.8°F | Ht 60.0 in | Wt 247.0 lb

## 2015-03-08 DIAGNOSIS — E559 Vitamin D deficiency, unspecified: Secondary | ICD-10-CM | POA: Diagnosis not present

## 2015-03-08 DIAGNOSIS — J449 Chronic obstructive pulmonary disease, unspecified: Secondary | ICD-10-CM

## 2015-03-08 DIAGNOSIS — J381 Polyp of vocal cord and larynx: Secondary | ICD-10-CM

## 2015-03-08 DIAGNOSIS — R5382 Chronic fatigue, unspecified: Secondary | ICD-10-CM

## 2015-03-08 DIAGNOSIS — R0602 Shortness of breath: Secondary | ICD-10-CM | POA: Diagnosis not present

## 2015-03-08 DIAGNOSIS — F419 Anxiety disorder, unspecified: Secondary | ICD-10-CM | POA: Diagnosis not present

## 2015-03-08 DIAGNOSIS — L530 Toxic erythema: Secondary | ICD-10-CM | POA: Diagnosis not present

## 2015-03-08 DIAGNOSIS — I2510 Atherosclerotic heart disease of native coronary artery without angina pectoris: Secondary | ICD-10-CM | POA: Diagnosis not present

## 2015-03-08 DIAGNOSIS — Z720 Tobacco use: Secondary | ICD-10-CM | POA: Diagnosis not present

## 2015-03-08 DIAGNOSIS — N183 Chronic kidney disease, stage 3 unspecified: Secondary | ICD-10-CM

## 2015-03-08 DIAGNOSIS — L538 Other specified erythematous conditions: Secondary | ICD-10-CM

## 2015-03-08 DIAGNOSIS — K5900 Constipation, unspecified: Secondary | ICD-10-CM | POA: Diagnosis not present

## 2015-03-08 DIAGNOSIS — I10 Essential (primary) hypertension: Secondary | ICD-10-CM | POA: Diagnosis not present

## 2015-03-08 DIAGNOSIS — K5909 Other constipation: Secondary | ICD-10-CM

## 2015-03-08 NOTE — Patient Instructions (Addendum)
Your goal blood pressure is less than 150 mmHg on top. Try to follow the DASH guidelines (DASH stands for Dietary Approaches to Stop Hypertension) Try to limit the sodium in your diet.  Ideally, consume less than 1.5 grams (less than 1,500mg ) per day. Do not add salt when cooking or at the table.  Check the sodium amount on labels when shopping, and choose items lower in sodium when given a choice. Avoid or limit foods that already contain a lot of sodium. Eat a diet rich in fruits and vegetables and whole grains. We'll get you to a lung doctor We'll get some labs today Please do get back in to see the kidney doctor We'll have you go back to the ENT doctor

## 2015-03-08 NOTE — Assessment & Plan Note (Addendum)
Check labs to r/o hypothyroidism; encouragement given healthier eating, weigh tloss

## 2015-03-08 NOTE — Assessment & Plan Note (Signed)
Check creatinine today. 

## 2015-03-08 NOTE — Assessment & Plan Note (Addendum)
Uncontrolled today; cannot check BP at home; seeing nephrologist; will have staff send note; see if she is taking medicines as prescribed; our list shows that she should be on beta-blocker, but I don't have nephro notes to back that up; DASH guidelines, smoking cessation stressed

## 2015-03-08 NOTE — Assessment & Plan Note (Signed)
Refer to lung specialist

## 2015-03-08 NOTE — Progress Notes (Signed)
BP 159/94 mmHg  Pulse 95  Temp(Src) 98.8 F (37.1 C)  Ht 5' (1.524 m)  Wt 247 lb (112.038 kg)  BMI 48.24 kg/m2  SpO2 96%   Subjective:    Patient ID: Katherine [name], female    DOB: 06/03/1940, 75 y.o.   MRN: [id number]  HPI: Katherine [name] is a 75 y.o. female  Chief Complaint  Patient presents with  . Hospitalization Follow-up    was hospitalized for COPD flare. She has a bottle of Prednisone for a taper that she should have finished by now but still has pills in the bottle. She does not see a pulmonologist.  . Medication Refill    She is requesting refills on Spiriva, Fluoxetine, and Hydralazine.  . Pain    She complains of sharp pain under both of her breasts when she moves and bends. states is started maybe a week or so ago,   She was hospitalized in December for COPD; that is doing a lot better; treated with prednisone and antibiotics; one cigarette a day; had CT scan done, showed mild to moderate centrilobular and paraseptal emphysematous changes; NO PE; degenerative changes of the thoracolumbar spine; also has coronary atherosclerosis, atherosclerotic calcifications of the aortic arch  She got a walker to use because her balance is off; that started in the hospital and was assessed in the hospital; doing some exercises; doesn't feel like she needs the walker; doesn't want home PT now  She says her thyroid is bothering her, pointing to the front of her neck; can't get the phlegm up; she went to the specialist, Dr. Tami Ribas (ENT), awhile ago; wants to go back and see him; I don't see note regarding this, but will be glad to refer her back; she could not give me much more information (I don't know if she has a cyst or goiter or some other problem); her medical hx notes polyp of vocal cord  Darker skin around neck; has dry mouth; no blurred vision; gets up at night to urinate, 3x a night  Pain under both breasts, comes and goes; worse with bending; coughing; no fevers; started when  she had the COPD flare; she sees Dr. Saralyn Pilar, cardiologist; last visit I see was September 29, 2014; echo was done, normal LVEF  She has HTN; she was referred to nephrologist; last time she saw kidney doctor was about 3 months ago; avoiding salt; avoiding NSAIDs  Takes fluoxetine for her mood; wants to continue  Relevant past medical, surgical, family and social history reviewed and updated as indicated.  Past Medical History  Diagnosis Date  . Anxiety and depression   . Tobacco abuse   . Angioedema   . Gallstones   . Prurigo nodularis   . Anxiety   . Hypertension   . GERD (gastroesophageal reflux disease)   . COPD (chronic obstructive pulmonary disease) ([place])   . Osteoarthritis   . Vitamin D deficiency disease   . Chronic constipation   . Chronic kidney disease     stage 3  . Left ventricular hypertrophy    Past Surgical History  Procedure Laterality Date  . Cholecystectomy    . Polypectomy  11/2011    vocal cord   Interim medical history since our last visit reviewed. Allergies and medications reviewed and updated.  Review of Systems  Per HPI unless specifically indicated above     Objective:    BP 159/94 mmHg  Pulse 95  Temp(Src) 98.8 F (37.1 C)  Ht 5' (1.524 m)  Wt 247 lb (112.038 kg)  BMI 48.24 kg/m2  SpO2 96%  Wt Readings from Last 3 Encounters:  03/08/15 247 lb (112.038 kg)  02/10/15 239 lb 6.4 oz (108.591 kg)  02/02/15 230 lb (104.327 kg)    Physical Exam  Constitutional: She appears well-developed and well-nourished. No distress.  Weight gain noted; does not appear to be fluid overloaded  Eyes: EOM are normal. No scleral icterus.  Neck: No JVD present.  Pulmonary/Chest: Effort normal. No respiratory distress. She has no wheezes. She has no rales.  Abdominal: Soft. Bowel sounds are normal. She exhibits no distension.  obese  Musculoskeletal: She exhibits no edema.  Neurological: She is alert.  Skin: Skin is warm. Rash (multiple picker's nodules  and excoriations on the arms) noted. No pallor.  Hyperpigmentation along neck suggestive of acanthosis nigricans; palmar erythema  Psychiatric: She has a normal mood and affect. Her speech is normal and behavior is normal. Her affect is not blunt.      Assessment & Plan:   Problem List Items Addressed This Visit      Cardiovascular and Mediastinum   Essential hypertension    Uncontrolled today; cannot check BP at home; seeing nephrologist; will have staff send note; see if she is taking medicines as prescribed; our list shows that she should be on beta-blocker, but I don't have nephro notes to back that up; DASH guidelines, smoking cessation stressed      Coronary artery disease    Noted on CT scan Dec 2016; will send note to Dr. Saralyn Pilar to see if/when stress testing done or indicated next        Respiratory   COPD (chronic obstructive pulmonary disease) ([place]) - Primary    Refer to lung specialist      Relevant Orders   Ambulatory referral to Pulmonology   Polyp of vocal cord    Patient says she wants to go back to her ENT for her thyroid, but I don't see notation of any thyroid problem, just vocal cord polyp; will enter referral and they will be able to discrern her previous hx based on records there; I just don't have ENT records in Epic      Relevant Orders   Ambulatory referral to ENT     Digestive   Chronic constipation    Check thyroid      Relevant Orders   T4, free (Completed)   TSH (Completed)     Genitourinary   Chronic kidney disease    Check creatinine today      Relevant Orders   Comprehensive metabolic panel (Completed)     Other   Morbid obesity ([place])    Check labs to r/o hypothyroidism; encouragement given healthier eating, weigh tloss      Relevant Orders   Lipid Panel w/o Chol/HDL Ratio (Completed)   Anxiety    Continue SSRI      Tobacco abuse    She unfortunately continues to smoke; encouraged her to take the next step and put them  down for good      Vitamin D deficiency disease    Recheck vit D      Relevant Orders   VITAMIN D 25 Hydroxy (Vit-D Deficiency, Fractures) (Completed)    Other Visit Diagnoses    Shortness of breath        Relevant Orders    CBC with Differential/Platelet (Completed)    Palmar erythema        check B12    Relevant Orders  Vitamin B12 (Completed)    Chronic fatigue        Relevant Orders    CBC with Differential/Platelet (Completed)        Follow up plan: Return in about 4 weeks (around 04/05/2015) for other  issues, skin problems.  Orders Placed This Encounter  Procedures  . T4, free  . CBC with Differential/Platelet  . Lipid Panel w/o Chol/HDL Ratio  . Vitamin B12  . Comprehensive metabolic panel  . VITAMIN D 25 Hydroxy (Vit-D Deficiency, Fractures)  . TSH  . Ambulatory referral to Pulmonology  . Ambulatory referral to ENT   An after-visit summary was printed and given to the patient at [facility].  Please see the patient instructions which may contain other information and recommendations beyond what is mentioned above in the assessment and plan.

## 2015-03-08 NOTE — Assessment & Plan Note (Signed)
Check thyroid   

## 2015-03-08 NOTE — Assessment & Plan Note (Signed)
-   Recheck vit D

## 2015-03-09 LAB — COMPREHENSIVE METABOLIC PANEL
ALK PHOS: 79 IU/L (ref 39–117)
ALT: 13 IU/L (ref 0–32)
AST: 16 IU/L (ref 0–40)
Albumin/Globulin Ratio: 1.4 (ref 1.1–2.5)
Albumin: 3.9 g/dL (ref 3.5–4.8)
BUN/Creatinine Ratio: 17 (ref 11–26)
BUN: 27 mg/dL (ref 8–27)
CHLORIDE: 105 mmol/L (ref 96–106)
CO2: 20 mmol/L (ref 18–29)
Calcium: 9.2 mg/dL (ref 8.7–10.3)
Creatinine, Ser: 1.57 mg/dL — ABNORMAL HIGH (ref 0.57–1.00)
GFR calc Af Amer: 37 mL/min/{1.73_m2} — ABNORMAL LOW (ref >59)
GFR calc non Af Amer: 32 mL/min/{1.73_m2} — ABNORMAL LOW (ref >59)
GLUCOSE: 133 mg/dL — AB (ref 65–99)
Globulin, Total: 2.8 g/dL (ref 1.5–4.5)
Potassium: 5 mmol/L (ref 3.5–5.2)
Sodium: 141 mmol/L (ref 134–144)
Total Protein: 6.7 g/dL (ref 6.0–8.5)

## 2015-03-09 LAB — CBC WITH DIFFERENTIAL/PLATELET
BASOS ABS: 0 10*3/uL (ref 0.0–0.2)
Basos: 0 %
EOS (ABSOLUTE): 0 10*3/uL (ref 0.0–0.4)
Eos: 0 %
HEMOGLOBIN: 13 g/dL (ref 11.1–15.9)
Hematocrit: 40.3 % (ref 34.0–46.6)
IMMATURE GRANS (ABS): 0 10*3/uL (ref 0.0–0.1)
IMMATURE GRANULOCYTES: 0 %
LYMPHS: 12 %
Lymphocytes Absolute: 0.9 10*3/uL (ref 0.7–3.1)
MCH: 30.6 pg (ref 26.6–33.0)
MCHC: 32.3 g/dL (ref 31.5–35.7)
MCV: 95 fL (ref 79–97)
MONOCYTES: 3 %
Monocytes Absolute: 0.2 10*3/uL (ref 0.1–0.9)
NEUTROS ABS: 6.2 10*3/uL (ref 1.4–7.0)
NEUTROS PCT: 85 %
Platelets: 260 10*3/uL (ref 150–379)
RBC: 4.25 x10E6/uL (ref 3.77–5.28)
RDW: 17 % — AB (ref 12.3–15.4)
WBC: 7.2 10*3/uL (ref 3.4–10.8)

## 2015-03-09 LAB — TSH: TSH: 0.662 u[IU]/mL (ref 0.450–4.500)

## 2015-03-09 LAB — LIPID PANEL W/O CHOL/HDL RATIO
Cholesterol, Total: 193 mg/dL (ref 100–199)
HDL: 69 mg/dL (ref >39)
LDL Calculated: 105 mg/dL — ABNORMAL HIGH (ref 0–99)
Triglycerides: 95 mg/dL (ref 0–149)
VLDL CHOLESTEROL CAL: 19 mg/dL (ref 5–40)

## 2015-03-09 LAB — T4, FREE: FREE T4: 1.08 ng/dL (ref 0.82–1.77)

## 2015-03-09 LAB — VITAMIN B12: VITAMIN B 12: 211 pg/mL (ref 211–946)

## 2015-03-09 LAB — VITAMIN D 25 HYDROXY (VIT D DEFICIENCY, FRACTURES): VIT D 25 HYDROXY: 31.5 ng/mL (ref 30.0–100.0)

## 2015-03-11 ENCOUNTER — Other Ambulatory Visit: Payer: Self-pay | Admitting: Family Medicine

## 2015-03-11 NOTE — Telephone Encounter (Signed)
Pt's husband came in stated pt needs refills on medications, did not know the names of the medications. States this was discussed at pt's appt on Monday. Husband stated one of the medications is for pt's blood pressure, stated the other one is a green and white capsule. Pharm is [facility] in [place]. He stated she only has one high blood pressure pill left. Please send refills ASAP. Thanks.

## 2015-03-11 NOTE — Telephone Encounter (Signed)
Routing to provider  

## 2015-03-12 MED ORDER — TIOTROPIUM BROMIDE MONOHYDRATE 18 MCG IN CAPS: 18.0000 ug | ORAL_CAPSULE | Freq: Every day | RESPIRATORY_TRACT | Status: DC

## 2015-03-14 ENCOUNTER — Telehealth: Payer: Self-pay | Admitting: Family Medicine

## 2015-03-14 DIAGNOSIS — I2510 Atherosclerotic heart disease of native coronary artery without angina pectoris: Secondary | ICD-10-CM | POA: Insufficient documentation

## 2015-03-14 DIAGNOSIS — N183 Chronic kidney disease, stage 3 unspecified: Secondary | ICD-10-CM

## 2015-03-14 DIAGNOSIS — J381 Polyp of vocal cord and larynx: Secondary | ICD-10-CM | POA: Insufficient documentation

## 2015-03-14 NOTE — Telephone Encounter (Signed)
I called, left voicemail; do NOT be alarmed I am calling on Sunday; just chance to call folks with labs; I'll try tomorrow

## 2015-03-14 NOTE — Assessment & Plan Note (Signed)
Patient says she wants to go back to her ENT for her thyroid, but I don't see notation of any thyroid problem, just vocal cord polyp; will enter referral and they will be able to discrern her previous hx based on records there; I just don't have ENT records in Epic

## 2015-03-14 NOTE — Assessment & Plan Note (Signed)
She unfortunately continues to smoke; encouraged her to take the next step and put them down for good

## 2015-03-14 NOTE — Assessment & Plan Note (Signed)
-   Continue SSRI 

## 2015-03-14 NOTE — Telephone Encounter (Signed)
B12 is low Kidney function declined; refer to nephrologist Thyroid tests are normal Call pt with results

## 2015-03-14 NOTE — Assessment & Plan Note (Signed)
Refer to nephrologist 

## 2015-03-14 NOTE — Assessment & Plan Note (Signed)
Noted on CT scan Dec 2016; will send note to Dr. Saralyn Pilar to see if/when stress testing done or indicated next

## 2015-03-16 NOTE — Telephone Encounter (Signed)
I talked with her husband, [name] Explained kidneys are not working as well as before; he says she was supposed to see a kidney doctor a year ago but it never got rescheduled; I told hiim I put in a referral and they should hear about that soon Avoid NSAIDs Start taking vitamin D3 1000 iu daily Other labs discussed

## 2015-03-22 ENCOUNTER — Other Ambulatory Visit: Payer: Self-pay | Admitting: Family Medicine

## 2015-03-22 NOTE — Telephone Encounter (Signed)
rx approved

## 2015-03-25 DIAGNOSIS — T65222A Toxic effect of tobacco cigarettes, intentional self-harm, initial encounter: Secondary | ICD-10-CM | POA: Diagnosis not present

## 2015-03-25 DIAGNOSIS — J381 Polyp of vocal cord and larynx: Secondary | ICD-10-CM | POA: Diagnosis not present

## 2015-04-05 ENCOUNTER — Ambulatory Visit: Payer: Commercial Managed Care - HMO | Admitting: Family Medicine

## 2015-04-07 ENCOUNTER — Encounter: Payer: Self-pay | Admitting: Family Medicine

## 2015-04-14 ENCOUNTER — Encounter: Payer: Self-pay | Admitting: Internal Medicine

## 2015-04-14 ENCOUNTER — Ambulatory Visit (INDEPENDENT_AMBULATORY_CARE_PROVIDER_SITE_OTHER): Payer: Commercial Managed Care - HMO | Admitting: Internal Medicine

## 2015-04-14 VITALS — BP 140/82 | HR 102 | Ht 60.0 in | Wt 252.8 lb

## 2015-04-14 DIAGNOSIS — Z720 Tobacco use: Secondary | ICD-10-CM | POA: Diagnosis not present

## 2015-04-14 DIAGNOSIS — J432 Centrilobular emphysema: Secondary | ICD-10-CM | POA: Diagnosis not present

## 2015-04-14 DIAGNOSIS — G479 Sleep disorder, unspecified: Secondary | ICD-10-CM

## 2015-04-14 MED ORDER — FLUTICASONE FUROATE-VILANTEROL 200-25 MCG/INH IN AEPB: 1.0000 | INHALATION_SPRAY | Freq: Every day | RESPIRATORY_TRACT | Status: DC

## 2015-04-14 NOTE — Patient Instructions (Addendum)
Follow up with Dr. Stevenson Clinch in:6-8wks - 2 view CXR for shortness of breath and COPD - PFTs and 6MWT prior to visit - ONO study.  - Breo 200/25 - 1 puff daily. -gargle and rinse after each use.  - cont with Spiriva daily - quit smoking - exercise as tolerated

## 2015-04-14 NOTE — Assessment & Plan Note (Signed)
String critical symptoms consistent with COPD. Further staging determine by pulmonary function testing and 6 minute walk test. Today we will continue her Spiriva, and had a ICS/LABA (BreoEllipta) further optimization of her COPD. She is to continue using her cane and walker.  Plan: -Avoid tobacco -Pulmonary function testing and 6 minute walk test prior to follow-up visit -2 view chest x-ray -Overnight pulse oximetry study -BreoEllipta 1 puff daily - cont with spiriva - exercise as tolerated

## 2015-04-14 NOTE — Assessment & Plan Note (Signed)
Tobacco Cessation - Counseling regarding benefits of smoking cessation strategies was provided for more than 12 min. - Educated that at this time smoking- cessation represents the single most important step that patient can take to enhance the length and quality of live. - Educated patient regarding alternatives of behavior interventions, pharmacotherapy including NRT and non-nicotine therapy such, and combinations of both. - Patient at this time: trying to quit on her own, down to 2 cigarettes per day for the last 1 month.

## 2015-04-14 NOTE — Progress Notes (Signed)
[facility] Consultation    Date: 04/14/2015  MRN# [id number] Katherine [name] 10/17/1940  Referring Physician: Dr. Sanda Klein PMD - Dr. Biagio Quint is a 75 y.o. old female seen in consultation for COPD optimization  CC:  Chief Complaint  Patient presents with  . pulmonary consult    pt. ref by dr.lada for COPD. pt c/o SOB. prod. cough yellow in color X32mo. occ. wheezing. denies chest pain/tightness.     HPI:  Patient Presents today for a visit for COPD optimization. She states that she's had COPD for a number of years now, she is currently on Spiriva which she is using every day. 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 No pets at home COPD worse during the summer times, COPD exacerbation symptoms consistent with increased shortness of breath, increased cough, increased wheezing, mild thick productive sputum. Over the last 5 years she states she's had at least 2 hospitalizations yearly requiring antibiotics and steroids. She states she is never been intubated for COPD either. No significant surgical history per the patient Past medical history consistent with COPD, anxiety, tobacco abuse, hypertension, obesity, CK, arthritis. She was last hospitalized in December (see below), after that hospitalization she was given a walker to use a regular basis. She endorses shortness of breath with minimal exertion, states she can get up from her bedroom and go to bathroom or kitchen and is moderately short of breath. Shortness of breath worse with climbing stairs, can only walk about 5-10 yards before she has to take a break. She is accompanied today by her husband, who is currently a smoker also. He states that she has significant snoring at night, and will lose her breath at times. Plan arrival to the office today  patient noted to be at 89-90% on room air. Previous history: Patient was hospitalized in December for COPD; treated with prednisone and antibiotics; 1-2 cigarette a day; had CT scan done, showed mild to moderate centrilobular and paraseptal emphysematous changes; NO PE; degenerative changes of the thoracolumbar spine; also has coronary atherosclerosis, atherosclerotic calcifications of the aortic arch   PMHX:   Past Medical History  Diagnosis Date  . Anxiety and depression   . Tobacco abuse   . Angioedema   . Gallstones   . Prurigo nodularis   . Anxiety   . Hypertension   . GERD (gastroesophageal reflux disease)   . COPD (chronic obstructive pulmonary disease) ([place])   . Osteoarthritis   . Vitamin D deficiency disease   . Chronic constipation   . Chronic kidney disease     stage 3  . Left ventricular hypertrophy    Surgical Hx:  Past Surgical History  Procedure Laterality Date  . Cholecystectomy    . Polypectomy  11/2011    vocal cord   Family Hx:  Family History  Problem Relation Age of Onset  . Alcohol abuse Mother   . Cancer Neg Hx   . COPD Neg Hx   . Diabetes Neg Hx   . Heart disease Neg Hx   . Stroke Neg Hx   . Hypertension Neg Hx    Social Hx:   Social History  Substance Use Topics  . Smoking status: Current Some Day Smoker -- 1.00 packs/day for 52 years    Types: Cigarettes  . Smokeless tobacco: Never Used  .  Alcohol Use: No     Comment: rare   Medication:   Current Outpatient Rx  Name  Route  Sig  Dispense  Refill  . acetaminophen (TYLENOL) 650 MG CR tablet   Oral   Take 650-1,300 mg by mouth every 8 (eight) hours as needed for pain.          [name] albuterol (PROVENTIL HFA) 108 (90 BASE) MCG/ACT inhaler   Inhalation   Inhale 2 puffs into the lungs every 4 (four) hours as needed for wheezing or shortness of breath.   1 Inhaler   0   . aspirin EC 81 MG tablet   Oral   Take 81 mg by mouth daily.         . cetirizine (ZYRTEC) 10 MG tablet       TAKE 1/2 TABLET BY MOUTH AT BEDTIME AS NEEDED   15 tablet   5   . cholecalciferol (VITAMIN D) 1000 units tablet   Oral   Take 1,000 Units by mouth daily.         [name] FLUoxetine (PROZAC) 10 MG capsule   Oral   Take 10 mg by mouth daily.         . hydrALAZINE (APRESOLINE) 25 MG tablet      TAKE 1 TABLET(25 MG) BY MOUTH TWICE DAILY   60 tablet   5   . metoprolol tartrate (LOPRESSOR) 25 MG tablet   Oral   Take 0.5 tablets (12.5 mg total) by mouth 2 (two) times daily.   60 tablet   0   . omeprazole (PRILOSEC) 40 MG capsule   Oral   Take 40 mg by mouth daily.         . predniSONE (DELTASONE) 10 MG tablet   Oral   Take 1 tablet (10 mg total) by mouth daily with breakfast.   20 tablet   0     Label  & dispense according to the schedule below: ...   . tiotropium (SPIRIVA HANDIHALER) 18 MCG inhalation capsule   Inhalation   Place 1 capsule (18 mcg total) into inhaler and inhale daily.   30 capsule   5   . fluticasone furoate-vilanterol (BREO ELLIPTA) 200-25 MCG/INH AEPB   Inhalation   Inhale 1 puff into the lungs daily.   60 each   5       Allergies:  Enalapril maleate  Review of Systems  Constitutional: Positive for malaise/fatigue. Negative for fever, chills and weight loss.  HENT: Negative for ear discharge, ear pain and hearing loss.   Eyes: Negative for blurred vision, double vision, pain and discharge.  Respiratory: Positive for shortness of breath and wheezing. Negative for cough, hemoptysis and sputum production.   Cardiovascular: Positive for leg swelling. Negative for chest pain.  Gastrointestinal: Negative for heartburn, nausea, vomiting, abdominal pain and diarrhea.  Genitourinary: Negative for dysuria.  Musculoskeletal: Positive for joint pain.  Skin: Negative for itching and rash.  Neurological: Positive for weakness. Negative for dizziness and headaches.  Endo/Heme/Allergies: Does not bruise/bleed easily.  Psychiatric/Behavioral: Negative for  depression.     Physical Examination:   VS: BP 140/82 mmHg  Pulse 102  Ht 5' (1.524 m)  Wt 252 lb 12.8 oz (114.669 kg)  BMI 49.37 kg/m2  SpO2 90%  General Appearance: No distress  Neuro:without focal findings, mental status, speech normal, alert and oriented, cranial nerves 2-12 intact, reflexes normal and symmetric, sensation grossly normal  HEENT: PERRLA, EOM intact, no ptosis, no other lesions noticed;  Mallampati 3-4 Pulmonary: normal breath sounds., diaphragmatic excursion normal.No wheezing, No rales;   Sputum Production:   CardiovascularNormal S1,S2.  No m/r/g.  Abdominal aorta pulsation normal.    Abdomen: Benign, Soft, non-tender, No masses, hepatosplenomegaly, No lymphadenopathy Renal:  No costovertebral tenderness  GU:  No performed at this time. Endoc: No evident thyromegaly, no signs of acromegaly or Cushing features Skin:   warm, no rashes, no ecchymosis  Extremities: normal, no cyanosis, clubbing, no edema, warm with normal capillary refill. Other findings:none   Rad results: (The following images and results were reviewed by Dr. Stevenson Clinch on 04/14/2015). CTA Chest 02/12/15 CLINICAL DATA: Shortness of breath, repeat PE study due to nondiagnostic examination yesterday  EXAM: CT ANGIOGRAPHY CHEST WITH CONTRAST  TECHNIQUE: Multidetector CT imaging of the chest was performed using the standard protocol during bolus administration of intravenous contrast. Multiplanar CT image reconstructions and MIPs were obtained to evaluate the vascular anatomy.  CONTRAST: 32mL OMNIPAQUE IOHEXOL 350 MG/ML SOLN  COMPARISON: CT chest dated 02/11/2015 and 09/16/2014  FINDINGS: No evidence of pulmonary embolism.  Mediastinum/Nodes: The heart is normal in size. No pericardial effusion.  Coronary atherosclerosis.  Atherosclerotic calcifications aortic arch.  Small mediastinal lymph nodes measuring up to 10 mm short axis.  Visualized thyroid is  unremarkable.  Lungs/Pleura: Scattered areas of scarring in the medial right middle lobe, lingula, and bilateral lower lobes.  No focal consolidation.  Evaluation of the lung parenchyma is constrained by respiratory motion. No suspicious pulmonary nodules.  Underlying mild to moderate centrilobular and paraseptal emphysematous changes.  No pleural effusion or pneumothorax.  Upper abdomen: Visualized upper abdomen is notable for cholecystectomy clips.  Musculoskeletal: Degenerative changes of the visualized thoracolumbar spine. Mild wedging of a mid thoracic vertebral body.  Review of the MIP images confirms the above findings.  IMPRESSION: No evidence of pulmonary embolism.  No evidence of acute cardiopulmonary disease.  ECHO 09/16/14 Study Conclusions  - Left ventricle: The cavity size was normal. There was mild concentric hypertrophy. Systolic function was normal. The estimated ejection fraction was in the range of 60% to 65%. Wall motion was normal; there were no regional wall motion abnormalities. Doppler parameters are consistent with abnormal left ventricular relaxation (grade 1 diastolic dysfunction). - Left atrium: The atrium was normal in size. - Right ventricle: Systolic function was normal. - Pulmonary arteries: Systolic pressure was within the normal range.  Impressions:  - Normal study. Challenging image quality secondary to body habitus.    Assessment and Plan:74 yo with PMHx of HTN seen in consultation for COPD optimization COPD (chronic obstructive pulmonary disease) ([place]) String critical symptoms consistent with COPD. Further staging determine by pulmonary function testing and 6 minute walk test. Today we will continue her Spiriva, and had a ICS/LABA (BreoEllipta) further optimization of her COPD. She is to continue using her cane and walker.  Plan: -Avoid tobacco -Pulmonary function testing and 6 minute walk test prior  to follow-up visit -2 view chest x-ray -Overnight pulse oximetry study -BreoEllipta 1 puff daily - cont with spiriva - exercise as tolerated   Tobacco abuse Tobacco Cessation - Counseling regarding benefits of smoking cessation strategies was provided for more than 12 min. - Educated that at this time smoking- cessation represents the single most important step that patient can take to enhance the length and quality of live. - Educated patient regarding alternatives of behavior interventions, pharmacotherapy including NRT and non-nicotine therapy such, and combinations of both. - Patient at this time: trying to quit on her  own, down to 2 cigarettes per day for the last 1 month.    Sleep disturbance Obstructive Sleep Apnea Screening The patient was screened with the STOP-BANG questionnaire. >3 positive responses is considered a positive screen  Snoring  YES  Tiredness  YES Observed Apnea YES Pressure (HTN) YES  BMI >35  YES Age > 50  YES Neck >17"  YES Gender (female) NO   Total:7/8   Screen: Positive  Plan: - obtain overnight pulse ox study - 6 minute walk test - diet, exercise and wt. Loss - discuss OSA testing at next visit.         Updated Medication List Outpatient Encounter Prescriptions as of 04/14/2015  Medication Sig  . acetaminophen (TYLENOL) 650 MG CR tablet Take 650-1,300 mg by mouth every 8 (eight) hours as needed for pain.   [name] albuterol (PROVENTIL HFA) 108 (90 BASE) MCG/ACT inhaler Inhale 2 puffs into the lungs every 4 (four) hours as needed for wheezing or shortness of breath.  [name] aspirin EC 81 MG tablet Take 81 mg by mouth daily.  . cetirizine (ZYRTEC) 10 MG tablet TAKE 1/2 TABLET BY MOUTH AT BEDTIME AS NEEDED  . cholecalciferol (VITAMIN D) 1000 units tablet Take 1,000 Units by mouth daily.  [name] FLUoxetine (PROZAC) 10 MG capsule Take 10 mg by mouth daily.  . hydrALAZINE (APRESOLINE) 25 MG tablet TAKE 1 TABLET(25 MG) BY MOUTH TWICE DAILY  . metoprolol  tartrate (LOPRESSOR) 25 MG tablet Take 0.5 tablets (12.5 mg total) by mouth 2 (two) times daily.  [name] omeprazole (PRILOSEC) 40 MG capsule Take 40 mg by mouth daily.  . predniSONE (DELTASONE) 10 MG tablet Take 1 tablet (10 mg total) by mouth daily with breakfast.  . tiotropium (SPIRIVA HANDIHALER) 18 MCG inhalation capsule Place 1 capsule (18 mcg total) into inhaler and inhale daily.  . fluticasone furoate-vilanterol (BREO ELLIPTA) 200-25 MCG/INH AEPB Inhale 1 puff into the lungs daily.  . [DISCONTINUED] FLUoxetine (PROZAC) 10 MG capsule TAKE 1 CAPSULE BY MOUTH EVERY DAY (Patient not taking: Reported on 04/14/2015)   No facility-administered encounter medications on file as of 04/14/2015.    Orders for this visit: Orders Placed This Encounter  Procedures  . DG Chest 2 View    Standing Status: Future     Number of Occurrences:      Standing Expiration Date: 06/13/2016    Order Specific Question:  Reason for Exam (SYMPTOM  OR DIAGNOSIS REQUIRED)    Answer:  copd    Order Specific Question:  Preferred imaging location?    Answer:  [facility]  . Pulse oximetry, overnight    Standing Status: Future     Number of Occurrences:      Standing Expiration Date: 04/13/2016  . Pulmonary function test    Standing Status: Future     Number of Occurrences:      Standing Expiration Date: 04/13/2016    Order Specific Question:  Where should this test be performed?    Answer:  [facility]    Order Specific Question:  Full PFT: includes the following: basic spirometry, spirometry pre & post bronchodilator, diffusion capacity (DLCO), lung volumes    Answer:  Full PFT  . 6 minute walk    Standing Status: Future     Number of Occurrences:      Standing Expiration Date: 04/13/2016    Order Specific Question:  Where should this test be performed?    Answer:  Other     Thank  you for  the consultation and for allowing [facility] to assist in the care of your patient. Our  recommendations are noted above.  Please contact us if we can be of further service.   Vilinda Boehringer, MD [facility] Office Number: [phone]  Note: This note was prepared with Dragon dictation along with smaller phrase technology. Any transcriptional errors that result from this process are unintentional.

## 2015-04-14 NOTE — Assessment & Plan Note (Signed)
Obstructive Sleep Apnea Screening The patient was screened with the STOP-BANG questionnaire. >3 positive responses is considered a positive screen  Snoring  YES  Tiredness  YES Observed Apnea YES Pressure (HTN) YES  BMI >35  YES Age > 50  YES Neck >17"  YES Gender (female) NO   Total:7/8   Screen: Positive  Plan: - obtain overnight pulse ox study - 6 minute walk test - diet, exercise and wt. Loss - discuss OSA testing at next visit.

## 2015-04-20 ENCOUNTER — Encounter: Payer: Self-pay | Admitting: Internal Medicine

## 2015-04-22 DIAGNOSIS — J432 Centrilobular emphysema: Secondary | ICD-10-CM | POA: Diagnosis not present

## 2015-04-26 DIAGNOSIS — I129 Hypertensive chronic kidney disease with stage 1 through stage 4 chronic kidney disease, or unspecified chronic kidney disease: Secondary | ICD-10-CM | POA: Diagnosis not present

## 2015-04-26 DIAGNOSIS — N183 Chronic kidney disease, stage 3 (moderate): Secondary | ICD-10-CM | POA: Diagnosis not present

## 2015-05-05 ENCOUNTER — Telehealth: Payer: Self-pay | Admitting: *Deleted

## 2015-05-05 DIAGNOSIS — G479 Sleep disorder, unspecified: Secondary | ICD-10-CM | POA: Diagnosis not present

## 2015-05-05 DIAGNOSIS — J432 Centrilobular emphysema: Secondary | ICD-10-CM | POA: Diagnosis not present

## 2015-05-05 DIAGNOSIS — J449 Chronic obstructive pulmonary disease, unspecified: Secondary | ICD-10-CM

## 2015-05-05 DIAGNOSIS — Z720 Tobacco use: Secondary | ICD-10-CM | POA: Diagnosis not present

## 2015-05-05 NOTE — Telephone Encounter (Signed)
Pt informed of the need for O2 at night at 2L. Order placed.

## 2015-06-03 ENCOUNTER — Telehealth: Payer: Self-pay | Admitting: Internal Medicine

## 2015-06-03 NOTE — Telephone Encounter (Signed)
Spoke with husband and schedule PFT and SMW when no provider in office due to transportation issues. Nothing further needed.

## 2015-06-03 NOTE — Telephone Encounter (Signed)
Pt husband calling stating he needs to cancel 6 minute walk test and follow up due to he won't be able to bring her for the test.  He is usually "Busy" during the afternoon, wanted to know if we can schedule the test's in the morning  Stated to him we only have afternoon unless wanting to see if another office could do it. He refused to go anywhere else for they have to pay someone for transportation. Would like to know what other options does he have.  Please call back.

## 2015-06-05 DIAGNOSIS — J449 Chronic obstructive pulmonary disease, unspecified: Secondary | ICD-10-CM | POA: Diagnosis not present

## 2015-06-08 ENCOUNTER — Ambulatory Visit: Payer: Commercial Managed Care - HMO

## 2015-06-09 ENCOUNTER — Ambulatory Visit: Payer: Commercial Managed Care - HMO | Admitting: Internal Medicine

## 2015-06-21 ENCOUNTER — Ambulatory Visit (INDEPENDENT_AMBULATORY_CARE_PROVIDER_SITE_OTHER): Payer: Commercial Managed Care - HMO | Admitting: *Deleted

## 2015-06-21 ENCOUNTER — Ambulatory Visit
Admission: RE | Admit: 2015-06-21 | Discharge: 2015-06-21 | Disposition: A | Discharge status: Home / Self Care | Payer: Commercial Managed Care - HMO | Source: Ambulatory Visit | Attending: Internal Medicine | Admitting: Internal Medicine

## 2015-06-21 ENCOUNTER — Ambulatory Visit (INDEPENDENT_AMBULATORY_CARE_PROVIDER_SITE_OTHER): Payer: Commercial Managed Care - HMO | Admitting: Internal Medicine

## 2015-06-21 DIAGNOSIS — J432 Centrilobular emphysema: Secondary | ICD-10-CM | POA: Diagnosis not present

## 2015-06-21 DIAGNOSIS — J449 Chronic obstructive pulmonary disease, unspecified: Secondary | ICD-10-CM | POA: Insufficient documentation

## 2015-06-21 DIAGNOSIS — R0602 Shortness of breath: Secondary | ICD-10-CM | POA: Diagnosis not present

## 2015-06-21 LAB — PULMONARY FUNCTION TEST
FEF 25-75 PRE: 1.06 L/s
FEF2575-%Pred-Pre: 71 %
FEV1-%Pred-Pre: 83 %
FEV1-Pre: 1.38 L
FEV1FVC-%Pred-Pre: 98 %
FEV6-%PRED-PRE: 89 %
FEV6-Pre: 1.82 L
FEV6FVC-%PRED-PRE: 105 %
FVC-%PRED-PRE: 85 %
FVC-PRE: 1.82 L
PRE FEV1/FVC RATIO: 75 %
PRE FEV6/FVC RATIO: 100 %

## 2015-06-21 NOTE — Progress Notes (Signed)
Tried multiple attempts to do PFT today. Pt got through the Pre-FVC. Unable to comprehend how to do the DLCO. Pt left after many attempts and unable to complete.

## 2015-06-21 NOTE — Addendum Note (Signed)
Addended by: Oscar La R on: 06/21/2015 11:00 AM   Modules accepted: Orders

## 2015-06-21 NOTE — Addendum Note (Signed)
Addended by: Oscar La R on: 06/21/2015 10:47 AM   Modules accepted: Orders

## 2015-06-21 NOTE — Progress Notes (Signed)
SMW performed today. 

## 2015-06-30 ENCOUNTER — Ambulatory Visit: Payer: Commercial Managed Care - HMO | Admitting: Internal Medicine

## 2015-07-05 DIAGNOSIS — J449 Chronic obstructive pulmonary disease, unspecified: Secondary | ICD-10-CM | POA: Diagnosis not present

## 2015-07-27 ENCOUNTER — Encounter (INDEPENDENT_AMBULATORY_CARE_PROVIDER_SITE_OTHER): Payer: Self-pay

## 2015-07-27 ENCOUNTER — Encounter: Payer: Self-pay | Admitting: Internal Medicine

## 2015-07-27 ENCOUNTER — Ambulatory Visit (INDEPENDENT_AMBULATORY_CARE_PROVIDER_SITE_OTHER): Payer: Commercial Managed Care - HMO | Admitting: Internal Medicine

## 2015-07-27 VITALS — BP 136/74 | HR 88 | Ht 60.0 in | Wt 248.0 lb

## 2015-07-27 DIAGNOSIS — G479 Sleep disorder, unspecified: Secondary | ICD-10-CM | POA: Diagnosis not present

## 2015-07-27 DIAGNOSIS — R0602 Shortness of breath: Secondary | ICD-10-CM | POA: Diagnosis not present

## 2015-07-27 DIAGNOSIS — J432 Centrilobular emphysema: Secondary | ICD-10-CM | POA: Diagnosis not present

## 2015-07-27 DIAGNOSIS — J984 Other disorders of lung: Secondary | ICD-10-CM | POA: Diagnosis not present

## 2015-07-27 MED ORDER — FLUTICASONE FUROATE 200 MCG/ACT IN AEPB: 1.0000 | INHALATION_SPRAY | Freq: Every day | RESPIRATORY_TRACT | Status: AC

## 2015-07-27 NOTE — Patient Instructions (Addendum)
Follow up with Dr. Stevenson Clinch in:8 weeks-Avoid tobacco -Spiromety prior to follow-up visit - cont with spiriva - Arnuity 267mcg - 1 puff daily, gargle and rinse after each use. 2 week trial - if able to tolerate, then call us back for full prescription.  - exercise as tolerated - stop Breo - Split night study prior to follow up visit.

## 2015-07-27 NOTE — Assessment & Plan Note (Signed)
Related to COPD and continued tobacco use.  Reviewed CTA Chest on 01/2015 - no masses, stable scarring on this recent CT Chest.   Plan - avoid tobacco - CXR as needed.

## 2015-07-27 NOTE — Assessment & Plan Note (Signed)
Obstructive Sleep Apnea Screening The patient was screened with the STOP-BANG questionnaire. >3 positive responses is considered a positive screen  Snoring  YES  Tiredness  YES Observed Apnea YES Pressure (HTN) YES  BMI >35  YES Age > 50  YES Neck >17"  YES Gender (female) NO   Total:7/8   Screen: Positive  Plan: - diet, exercise and wt. Loss - discuss OSA testing at next visit.  - split night study prior to follow up visit.

## 2015-07-27 NOTE — Progress Notes (Signed)
[facility] Consultation      MRN# [id number] Katherine [name] 03-21-1940   CC: Chief Complaint  Patient presents with  . Follow-up    smw & cxr results. pt states breathing has improved since last OV.  unable to use breo due to making her feel like she was choking. on 2L @ bedtime. wakes feeling dizzy every a.m. occ face swollen in a.m. denies sob, cough,wheezing or cp/tightness       Brief History: 75 year old female with moderate COPD, presents for COPD optimization, started on Spiriva with good tolerance added Breo with poor tolerance.   Events since last clinic visit: She presents today for follow-up visit of her COPD optimization. At her last visit she was started on Breo [place] for COPD optimization regiment, however she stated made her feel upset with worsening cough and she subsequently stopped the medication. She was presented for pulmonary function testing but could not complete DLCO and lung volumes after multiple attempts due to limited understanding and inability to follow directions accurately.      Medication:   Current Outpatient Rx  Name  Route  Sig  Dispense  Refill  . acetaminophen (TYLENOL) 650 MG CR tablet   Oral   Take 650-1,300 mg by mouth every 8 (eight) hours as needed for pain.          [name] albuterol (PROVENTIL HFA) 108 (90 BASE) MCG/ACT inhaler   Inhalation   Inhale 2 puffs into the lungs every 4 (four) hours as needed for wheezing or shortness of breath.   1 Inhaler   0   . aspirin EC 81 MG tablet   Oral   Take 81 mg by mouth daily.         . cetirizine (ZYRTEC) 10 MG tablet      TAKE 1/2 TABLET BY MOUTH AT BEDTIME AS NEEDED   15 tablet   5   . cholecalciferol (VITAMIN D) 1000 units tablet   Oral   Take 1,000 Units by mouth daily.         [name] FLUoxetine (PROZAC) 10 MG capsule   Oral   Take 10 mg by mouth daily.         . hydrALAZINE (APRESOLINE) 25 MG tablet      TAKE 1 TABLET(25 MG) BY MOUTH TWICE DAILY  60 tablet   5   . metoprolol tartrate (LOPRESSOR) 25 MG tablet   Oral   Take 0.5 tablets (12.5 mg total) by mouth 2 (two) times daily.   60 tablet   0   . omeprazole (PRILOSEC) 40 MG capsule   Oral   Take 40 mg by mouth daily.         . predniSONE (DELTASONE) 10 MG tablet   Oral   Take 1 tablet (10 mg total) by mouth daily with breakfast.   20 tablet   0     Label  & dispense according to the schedule below: ...   . tiotropium (SPIRIVA HANDIHALER) 18 MCG inhalation capsule   Inhalation   Place 1 capsule (18 mcg total) into inhaler and inhale daily.   30 capsule   5   . fluticasone furoate-vilanterol (BREO ELLIPTA) 200-25 MCG/INH AEPB   Inhalation   Inhale 1 puff into the lungs daily. Patient not taking: Reported on 07/27/2015   60 each   5      Review of Systems  Constitutional: Negative for fever and chills.  Eyes: Negative for blurred vision and double vision.  Respiratory: Positive for cough, shortness of breath and wheezing.   Gastrointestinal: Negative for nausea, vomiting and abdominal pain.  Genitourinary: Negative for dysuria.  Musculoskeletal: Negative for myalgias.  Neurological: Negative for dizziness and headaches.  Endo/Heme/Allergies: Does not bruise/bleed easily.      Allergies:  Enalapril maleate  Physical Examination:  VS: BP 136/74 mmHg  Pulse 88  Ht 5' (1.524 m)  Wt 248 lb (112.492 kg)  BMI 48.43 kg/m2  SpO2 93%  General Appearance: No distress  HEENT: PERRLA, no ptosis, no other lesions noticed Pulmonary:normal breath sounds., diaphragmatic excursion normal.No wheezing, No rales   Cardiovascular:  Normal S1,S2.  No m/r/g.     Abdomen:Exam: Benign, Soft, non-tender, No masses  Skin:   warm, no rashes, no ecchymosis  Extremities: normal, no cyanosis, clubbing, warm with normal capillary refill.      Rad results: (The following images and results were reviewed by Dr. Stevenson Clinch on 07/27/2015).  COMPARISON: Portable chest x-ray of  February 10, 2015  FINDINGS: The lungs remain hyperinflated. The interstitial markings remain coarse. There is chronic scarring at the left lung base anteriorly. There is no alveolar infiltrate. There is no pleural effusion. The heart is normal in size. There is tortuosity of the ascending and descending thoracic aorta. The observed bony thorax exhibits no acute abnormality.  IMPRESSION: COPD. There is no active cardiopulmonary disease.  6 minute walk test 06/21/15-lowest saturation 93%, total distance 120 m/394 feet, stopped due to leg pain.   Assessment and Plan: 75 year old female seen in follow-up for COPD optimization COPD (chronic obstructive pulmonary disease) (HCC) Clinical symptoms consistent with COPD. Will schedule patient for Spirometry Unable to tolerate Breo due to dry mouth and throat irritation.  Gave sample of Arnuity - 1 puff daily, gargle and rinse after each use.   Current COPD regiment Spiriva Arnuity 2L O2 at night She is to continue using her cane and walker.  Plan: -Avoid tobacco -Spiromety prior to follow-up visit - cont with spiriva - Arnuity 244mcg - 1 puff daily, gargle and rinse after each use.  - exercise as tolerated     Sleep disturbance Obstructive Sleep Apnea Screening The patient was screened with the STOP-BANG questionnaire. >3 positive responses is considered a positive screen  Snoring  YES  Tiredness  YES Observed Apnea YES Pressure (HTN) YES  BMI >35  YES Age > 50  YES Neck >17"  YES Gender (female) NO   Total:7/8   Screen: Positive  Plan: - diet, exercise and wt. Loss - discuss OSA testing at next visit.  - split night study prior to follow up visit.        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  Reviewed CTA Chest on 01/2015 - no masses, stable scarring on this recent CT Chest.   Plan - avoid tobacco - CXR as needed.   SOB (shortness of breath) Mild shortness of breath symptoms  typically with exertion. Likely from obesity and deconditioning in combination with COPD and continued tobacco use. Suggested lifestyle modification  - diet and exercise Split night study Tobacco cessation       Updated Medication List Outpatient Encounter Prescriptions as of 07/27/2015  Medication Sig  . acetaminophen (TYLENOL) 650 MG CR tablet Take 650-1,300 mg by mouth every 8 (eight) hours as needed for pain.   [name] albuterol (PROVENTIL HFA) 108 (90 BASE) MCG/ACT inhaler Inhale 2 puffs into the lungs every 4 (four) hours as needed for wheezing or shortness of breath.  [name]  aspirin EC 81 MG tablet Take 81 mg by mouth daily.  . cetirizine (ZYRTEC) 10 MG tablet TAKE 1/2 TABLET BY MOUTH AT BEDTIME AS NEEDED  . cholecalciferol (VITAMIN D) 1000 units tablet Take 1,000 Units by mouth daily.  [name] FLUoxetine (PROZAC) 10 MG capsule Take 10 mg by mouth daily.  . hydrALAZINE (APRESOLINE) 25 MG tablet TAKE 1 TABLET(25 MG) BY MOUTH TWICE DAILY  . metoprolol tartrate (LOPRESSOR) 25 MG tablet Take 0.5 tablets (12.5 mg total) by mouth 2 (two) times daily.  [name] omeprazole (PRILOSEC) 40 MG capsule Take 40 mg by mouth daily.  . predniSONE (DELTASONE) 10 MG tablet Take 1 tablet (10 mg total) by mouth daily with breakfast.  . tiotropium (SPIRIVA HANDIHALER) 18 MCG inhalation capsule Place 1 capsule (18 mcg total) into inhaler and inhale daily.  . fluticasone furoate-vilanterol (BREO ELLIPTA) 200-25 MCG/INH AEPB Inhale 1 puff into the lungs daily. (Patient not taking: Reported on 07/27/2015)   No facility-administered encounter medications on file as of 07/27/2015.    Orders for this visit: Orders Placed This Encounter  Procedures  . Spirometry with Graph    Order Specific Question:  Where should this test be performed?    Answer:  [facility]    Thank  you for the visitation and for allowing  [facility] to assist in the care of your patient. Our recommendations are noted above.   Please contact us if we can be of further service.  Vilinda Boehringer, MD  Pulmonary and Critical Care Office Number: [phone]  Note: This note was prepared with Dragon dictation along with smaller phrase technology. Any transcriptional errors that result from this process are unintentional.

## 2015-07-27 NOTE — Assessment & Plan Note (Signed)
Clinical symptoms consistent with COPD. Will schedule patient for Spirometry Unable to tolerate Breo due to dry mouth and throat irritation.  Gave sample of Arnuity - 1 puff daily, gargle and rinse after each use.   Current COPD regiment Spiriva Arnuity 2L O2 at night She is to continue using her cane and walker.  Plan: -Avoid tobacco -Spiromety prior to follow-up visit - cont with spiriva - Arnuity 270mcg - 1 puff daily, gargle and rinse after each use.  - exercise as tolerated

## 2015-07-27 NOTE — Progress Notes (Signed)
Patient ID: Katherine [name], female   DOB: [birth year], 75 y.o.   MRN: [id number]  Patient seen in the office today and instructed on use of arnuity ellipta  .  Patient expressed understanding and demonstrated technique.

## 2015-07-27 NOTE — Assessment & Plan Note (Signed)
Mild shortness of breath symptoms typically with exertion. Likely from obesity and deconditioning in combination with COPD and continued tobacco use. Suggested lifestyle modification  - diet and exercise Split night study Tobacco cessation

## 2015-08-04 IMAGING — CR DG CHEST 1V PORT
1 series · 1 of 1 positions shown · non-contrast
Comparison: 11/22/2011

CLINICAL DATA: Shortness of breath

EXAM:
PORTABLE CHEST - 1 VIEW

[ap]
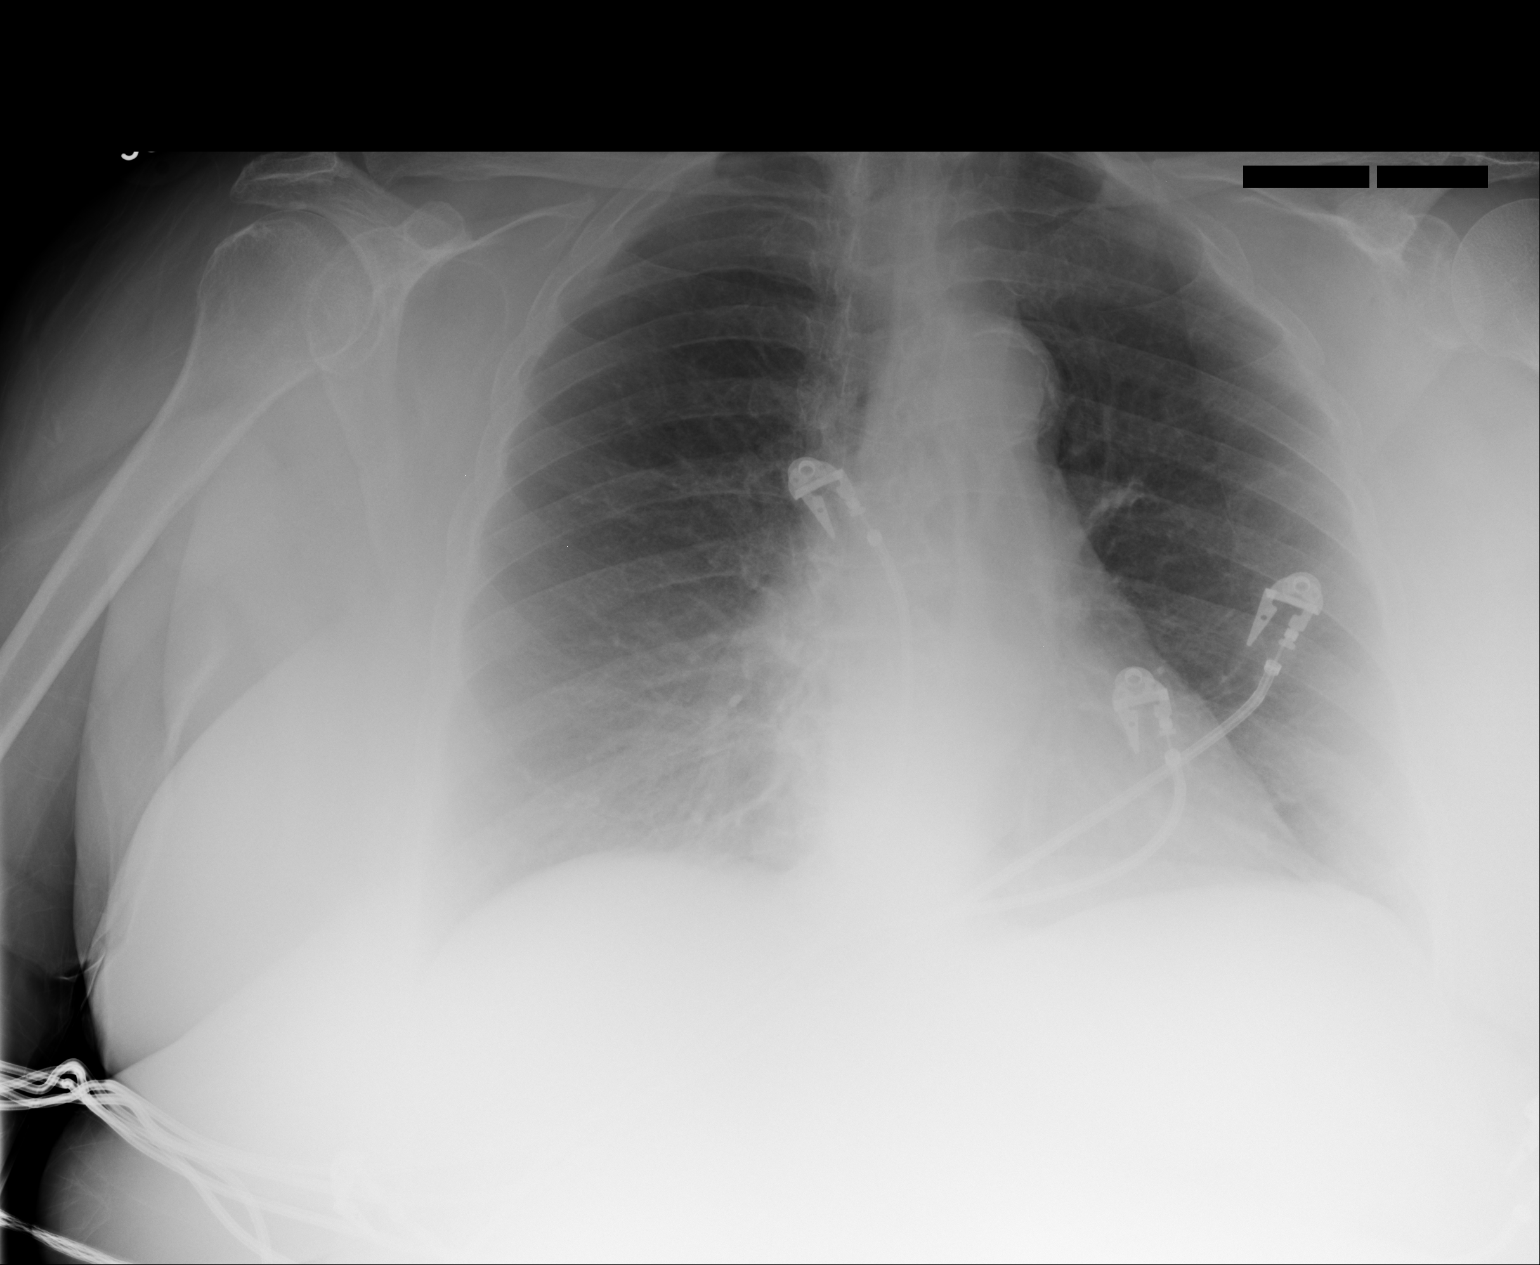

[1 of 1 positions shown; findings below may reference images not displayed]

FINDINGS: The heart size and mediastinal contours are within normal limits.
Both lungs are clear. The visualized skeletal structures are
unremarkable. Cardiac leads obscure detail. Lung bases are
suboptimally visualized due to body habitus.
IMPRESSION: No active disease.

## 2015-08-05 DIAGNOSIS — J449 Chronic obstructive pulmonary disease, unspecified: Secondary | ICD-10-CM | POA: Diagnosis not present

## 2015-08-09 DIAGNOSIS — R809 Proteinuria, unspecified: Secondary | ICD-10-CM | POA: Diagnosis not present

## 2015-08-09 DIAGNOSIS — I129 Hypertensive chronic kidney disease with stage 1 through stage 4 chronic kidney disease, or unspecified chronic kidney disease: Secondary | ICD-10-CM | POA: Diagnosis not present

## 2015-08-09 DIAGNOSIS — N183 Chronic kidney disease, stage 3 (moderate): Secondary | ICD-10-CM | POA: Diagnosis not present

## 2015-08-09 DIAGNOSIS — N2581 Secondary hyperparathyroidism of renal origin: Secondary | ICD-10-CM | POA: Diagnosis not present

## 2015-09-02 IMAGING — CR DG CHEST 2V
1 series · 2 of 2 positions shown · non-contrast
Comparison: 01/22/2013

CLINICAL DATA: Cough.  Body aches.  Fever.

EXAM:
CHEST  2 VIEW

[Series 1: w chest pa · 0.14mm/px · 2 of 2 slices shown]
[im 1/2]
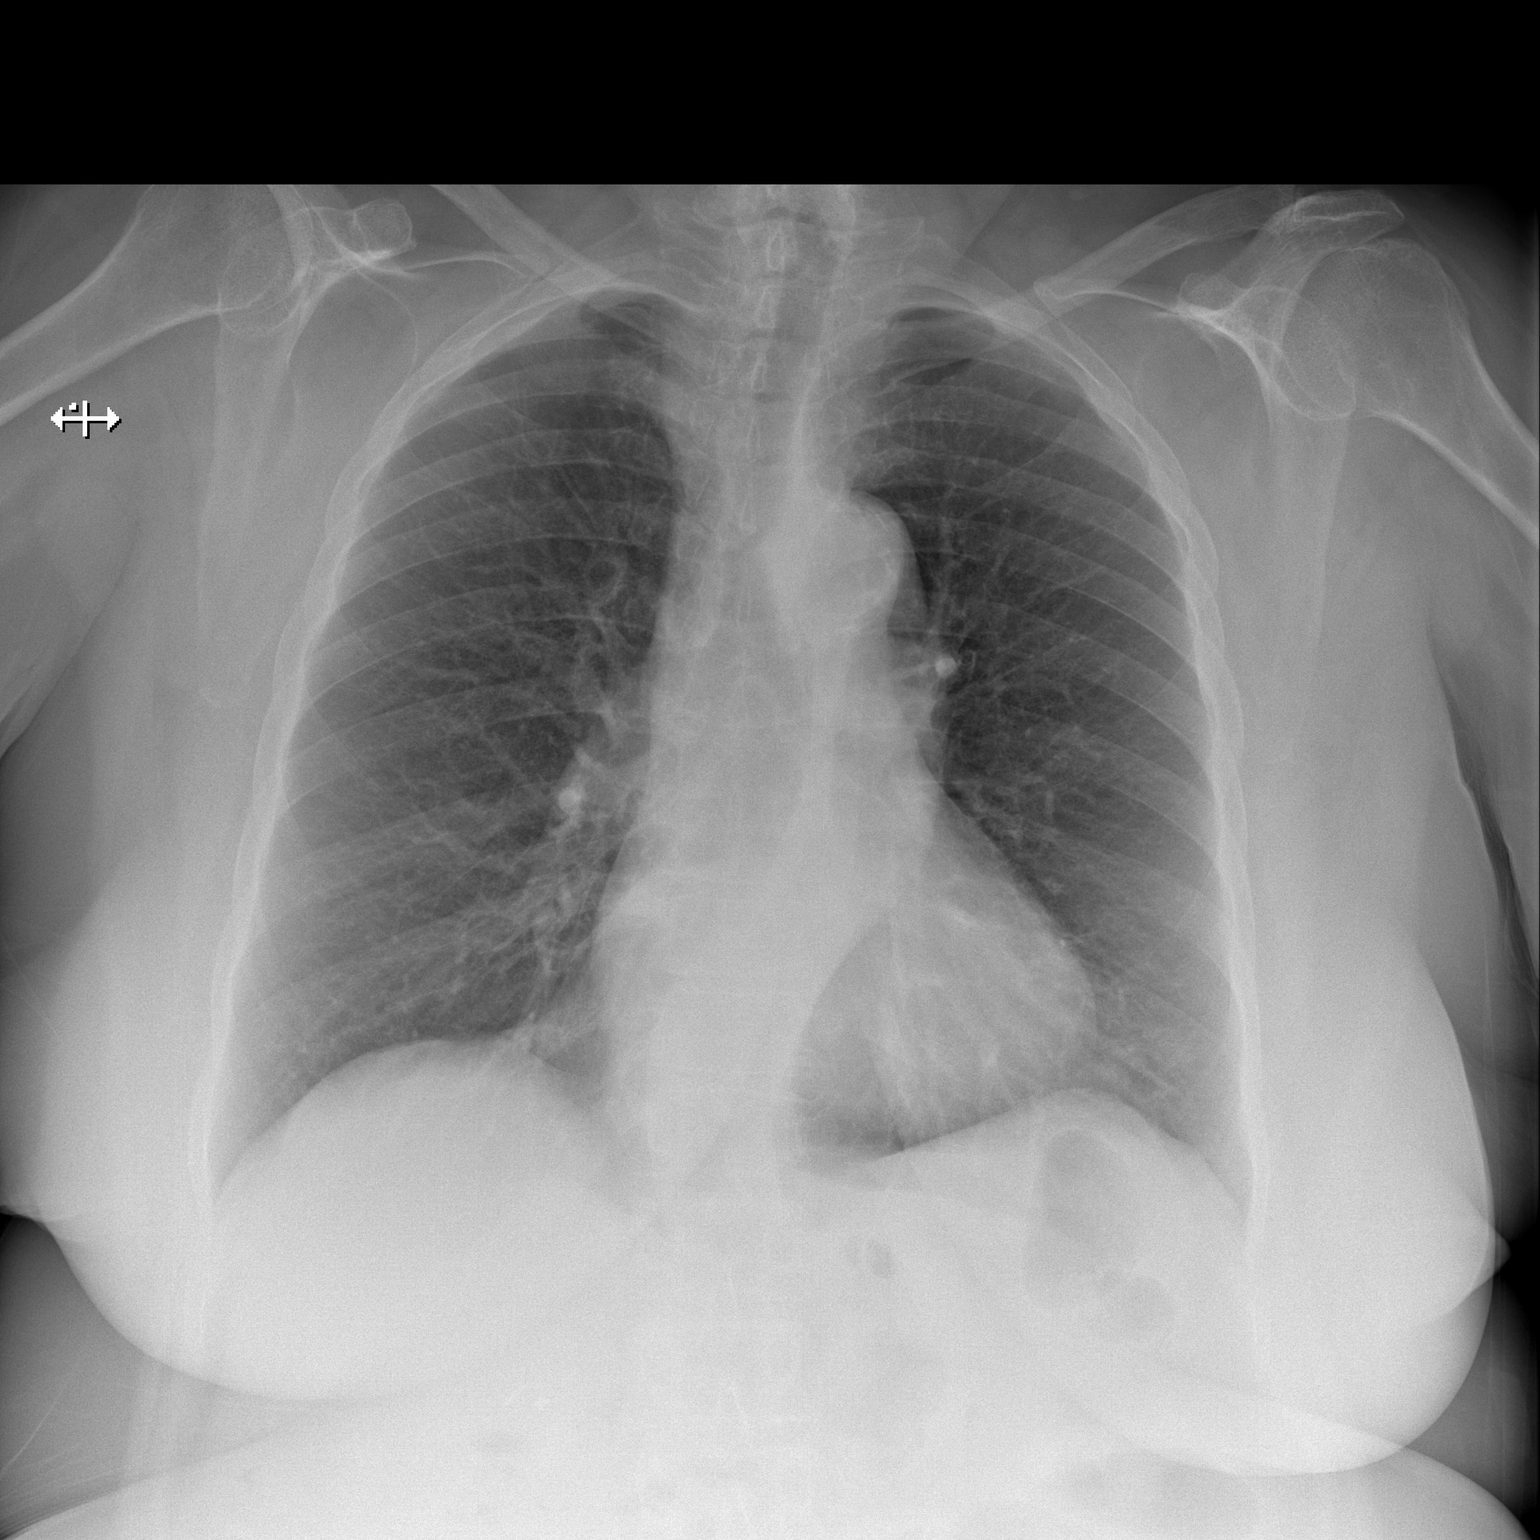
[im 2/2]
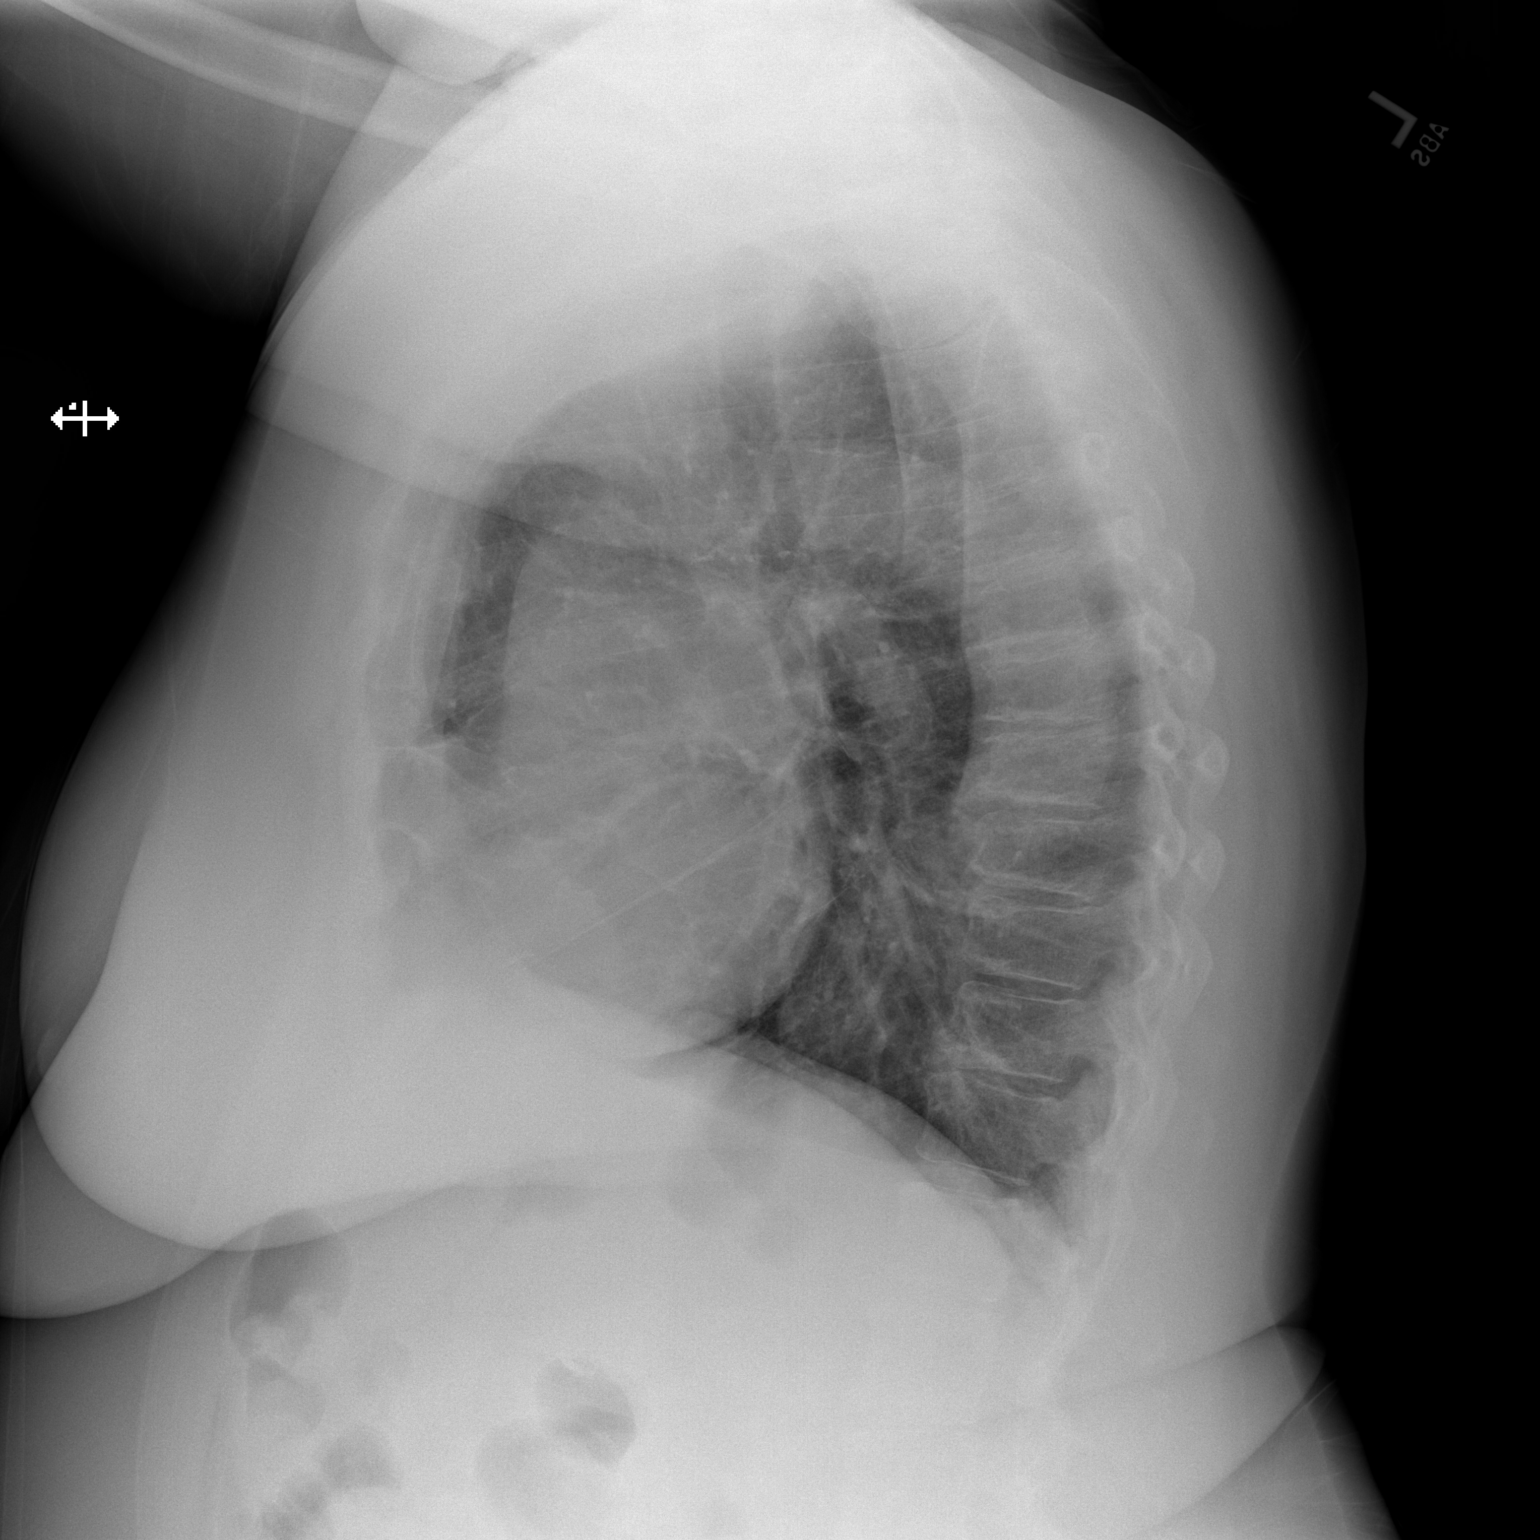

[2 of 2 positions shown; findings below may reference images not displayed]

FINDINGS: Midline trachea. Normal heart size with a tortuous, atherosclerotic
thoracic aorta. No pleural effusion or pneumothorax. Clear lungs.
IMPRESSION: No acute cardiopulmonary disease.

## 2015-09-04 DIAGNOSIS — J449 Chronic obstructive pulmonary disease, unspecified: Secondary | ICD-10-CM | POA: Diagnosis not present

## 2015-09-07 ENCOUNTER — Encounter: Payer: Self-pay | Admitting: Unknown Physician Specialty

## 2015-09-07 ENCOUNTER — Ambulatory Visit (INDEPENDENT_AMBULATORY_CARE_PROVIDER_SITE_OTHER): Payer: Commercial Managed Care - HMO | Admitting: Unknown Physician Specialty

## 2015-09-07 VITALS — BP 103/71 | HR 77 | Temp 98.6°F | Ht 61.1 in | Wt 249.8 lb

## 2015-09-07 DIAGNOSIS — N183 Chronic kidney disease, stage 3 unspecified: Secondary | ICD-10-CM

## 2015-09-07 DIAGNOSIS — M75100 Unspecified rotator cuff tear or rupture of unspecified shoulder, not specified as traumatic: Secondary | ICD-10-CM | POA: Insufficient documentation

## 2015-09-07 DIAGNOSIS — R7301 Impaired fasting glucose: Secondary | ICD-10-CM

## 2015-09-07 DIAGNOSIS — I10 Essential (primary) hypertension: Secondary | ICD-10-CM

## 2015-09-07 DIAGNOSIS — J432 Centrilobular emphysema: Secondary | ICD-10-CM

## 2015-09-07 DIAGNOSIS — G5602 Carpal tunnel syndrome, left upper limb: Secondary | ICD-10-CM

## 2015-09-07 DIAGNOSIS — M75101 Unspecified rotator cuff tear or rupture of right shoulder, not specified as traumatic: Secondary | ICD-10-CM

## 2015-09-07 DIAGNOSIS — Z720 Tobacco use: Secondary | ICD-10-CM | POA: Diagnosis not present

## 2015-09-07 LAB — BAYER DCA HB A1C WAIVED: HB A1C: 5.8 % (ref <7.0)

## 2015-09-07 NOTE — Assessment & Plan Note (Signed)
Encouraged to quit. 

## 2015-09-07 NOTE — Assessment & Plan Note (Signed)
Numbness seems to be coming from that.  Already had sugery

## 2015-09-07 NOTE — Assessment & Plan Note (Signed)
Stable, continue present medications.   

## 2015-09-07 NOTE — Assessment & Plan Note (Signed)
Stable.  Continue inhalers and encouraged to quit smoking

## 2015-09-07 NOTE — Assessment & Plan Note (Signed)
Order home PT .  

## 2015-09-07 NOTE — Progress Notes (Signed)
BP 103/71 (BP Location: Left Arm, Patient Position: Sitting, Cuff Size: Large)   Pulse 77   Temp 98.6 F (37 C)   Ht 5' 1.1" (1.552 m)   Wt 249 lb 12.8 oz (113.3 kg)   SpO2 94%   BMI 47.04 kg/m    Subjective:    Patient ID: Katherine [name], female    DOB: Mar 04, 1940, 75 y.o.   MRN: [id number]  HPI: Katherine [name] is a 75 y.o. female  Chief Complaint  Patient presents with  . Hypertension  . Spasms    pt states she has muscle spams in neck, side, and right arm.   . finger problem    pt states she does not have any feeling in her left pinky finger    Depression Taking Fluoxetine and doing well Depression screen [facility] 2/9 09/07/2015 08/12/2014  Decreased Interest 0 0  Down, Depressed, Hopeless 0 0  PHQ - 2 Score 0 0  Altered sleeping 0 -  Tired, decreased energy 1 -  Change in appetite 2 -  Feeling bad or failure about yourself  0 -  Trouble concentrating 0 -  Moving slowly or fidgety/restless 0 -  Suicidal thoughts 0 -  PHQ-9 Score 3 -   COPD Stable with increased problem with the hot weather recently.  Taking Spireva daily and using Albuterol only about twice a week.   Hypertension Using medications without difficulty Average home BPs "pretty normal"   No problems or lightheadedness No chest pain with exertion or shortness of breath Some swelling in feet   Numbness Left pinky but they all hurt.  This started before and after a carpal tunnel a while ago.  She mentions it today because she wonders if it is arthritis.    Right side spasms This started 1-2 weeks ago.  Sometimes neck, shoulder, arm and side.  This makes it come and go and takes Tylenol without improvement.  No alleviating or worsening factors but bothering at night.  The pain is sharp and sudden.  Sometimes lasts 2-3 hours.    Relevant past medical, surgical, family and social history reviewed and updated as indicated. Interim medical history since our last visit reviewed. Allergies and medications  reviewed and updated.  Review of Systems  Per HPI unless specifically indicated above     Objective:    BP 103/71 (BP Location: Left Arm, Patient Position: Sitting, Cuff Size: Large)   Pulse 77   Temp 98.6 F (37 C)   Ht 5' 1.1" (1.552 m)   Wt 249 lb 12.8 oz (113.3 kg)   SpO2 94%   BMI 47.04 kg/m   Wt Readings from Last 3 Encounters:  09/07/15 249 lb 12.8 oz (113.3 kg)  07/27/15 248 lb (112.5 kg)  04/14/15 252 lb 12.8 oz (114.7 kg)    Physical Exam  Constitutional: She is oriented to person, place, and time. She appears well-developed and well-nourished. No distress.  HENT:  Head: Normocephalic and atraumatic.  Eyes: Conjunctivae and lids are normal. Right eye exhibits no discharge. Left eye exhibits no discharge. No scleral icterus.  Neck: Normal range of motion. Neck supple. No JVD present. Carotid bruit is not present.  Cardiovascular: Normal rate, regular rhythm and normal heart sounds.   Pulmonary/Chest: Effort normal. She has decreased breath sounds.  Abdominal: Normal appearance. There is no splenomegaly or hepatomegaly.  Musculoskeletal: Normal range of motion.       Right shoulder: She exhibits tenderness and decreased strength. She exhibits normal range of  motion, no bony tenderness, no swelling, no effusion, no crepitus, no deformity, no laceration, no pain, no spasm and normal pulse.  Neurological: She is alert and oriented to person, place, and time.  Skin: Skin is warm, dry and intact. No rash noted. No pallor.  Psychiatric: She has a normal mood and affect. Her behavior is normal. Judgment and thought content normal.    Results for orders placed or performed in visit on 06/21/15  Pulmonary function test  Result Value Ref Range   FVC-Pre 1.82 L   FVC-%Pred-Pre 85 %   FEV1-Pre 1.38 L   FEV1-%Pred-Pre 83 %   FEV6-Pre 1.82 L   FEV6-%Pred-Pre 89 %   Pre FEV1/FVC ratio 75 %   FEV1FVC-%Pred-Pre 98 %   Pre FEV6/FVC Ratio 100 %   FEV6FVC-%Pred-Pre 105 %   FEF  25-75 Pre 1.06 L/sec   FEF2575-%Pred-Pre 71 %      Assessment & Plan:   Problem List Items Addressed This Visit      Unprioritized   Carpal tunnel syndrome of left wrist    Numbness seems to be coming from that.  Already had sugery      Chronic kidney disease   Relevant Orders   Comprehensive metabolic panel   COPD (chronic obstructive pulmonary disease) (HCC)    Stable.  Continue inhalers and encouraged to quit smoking      Essential hypertension    Stable, continue present medications.        Relevant Orders   Comprehensive metabolic panel   Rotator cuff syndrome    Order home PT      Relevant Orders   Ambulatory referral to [facility] abuse    Encouraged to quit       Other Visit Diagnoses    IFG (impaired fasting glucose)    -  Primary   Relevant Orders   Bayer DCA Hb A1c Waived       Follow up plan: Return in about 6 months (around 03/09/2016) for physical-+.

## 2015-09-08 ENCOUNTER — Encounter: Payer: Self-pay | Admitting: Unknown Physician Specialty

## 2015-09-08 LAB — COMPREHENSIVE METABOLIC PANEL
ALT: 9 IU/L (ref 0–32)
AST: 13 IU/L (ref 0–40)
Albumin/Globulin Ratio: 1.4 (ref 1.2–2.2)
Albumin: 3.7 g/dL (ref 3.5–4.8)
Alkaline Phosphatase: 84 IU/L (ref 39–117)
BUN / CREAT RATIO: 12 (ref 12–28)
BUN: 19 mg/dL (ref 8–27)
Bilirubin Total: <0.2 mg/dL (ref 0.0–1.2)
CALCIUM: 9 mg/dL (ref 8.7–10.3)
CO2: 24 mmol/L (ref 18–29)
CREATININE: 1.6 mg/dL — AB (ref 0.57–1.00)
Chloride: 100 mmol/L (ref 96–106)
GFR, EST AFRICAN AMERICAN: 37 mL/min/{1.73_m2} — AB (ref >59)
GFR, EST NON AFRICAN AMERICAN: 32 mL/min/{1.73_m2} — AB (ref >59)
GLOBULIN, TOTAL: 2.7 g/dL (ref 1.5–4.5)
Glucose: 100 mg/dL — ABNORMAL HIGH (ref 65–99)
POTASSIUM: 4.9 mmol/L (ref 3.5–5.2)
SODIUM: 140 mmol/L (ref 134–144)
Total Protein: 6.4 g/dL (ref 6.0–8.5)

## 2015-09-09 ENCOUNTER — Ambulatory Visit: Payer: Commercial Managed Care - HMO | Attending: Pulmonary Disease

## 2015-09-09 DIAGNOSIS — G4733 Obstructive sleep apnea (adult) (pediatric): Secondary | ICD-10-CM | POA: Diagnosis not present

## 2015-09-09 DIAGNOSIS — J439 Emphysema, unspecified: Secondary | ICD-10-CM | POA: Diagnosis not present

## 2015-09-09 DIAGNOSIS — G479 Sleep disorder, unspecified: Secondary | ICD-10-CM | POA: Diagnosis present

## 2015-09-10 DIAGNOSIS — F329 Major depressive disorder, single episode, unspecified: Secondary | ICD-10-CM | POA: Diagnosis not present

## 2015-09-10 DIAGNOSIS — M1991 Primary osteoarthritis, unspecified site: Secondary | ICD-10-CM | POA: Diagnosis not present

## 2015-09-10 DIAGNOSIS — J449 Chronic obstructive pulmonary disease, unspecified: Secondary | ICD-10-CM | POA: Diagnosis not present

## 2015-09-10 DIAGNOSIS — M75101 Unspecified rotator cuff tear or rupture of right shoulder, not specified as traumatic: Secondary | ICD-10-CM | POA: Diagnosis not present

## 2015-09-10 DIAGNOSIS — E559 Vitamin D deficiency, unspecified: Secondary | ICD-10-CM | POA: Diagnosis not present

## 2015-09-10 DIAGNOSIS — N183 Chronic kidney disease, stage 3 (moderate): Secondary | ICD-10-CM | POA: Diagnosis not present

## 2015-09-10 DIAGNOSIS — F418 Other specified anxiety disorders: Secondary | ICD-10-CM | POA: Diagnosis not present

## 2015-09-10 DIAGNOSIS — I129 Hypertensive chronic kidney disease with stage 1 through stage 4 chronic kidney disease, or unspecified chronic kidney disease: Secondary | ICD-10-CM | POA: Diagnosis not present

## 2015-09-11 ENCOUNTER — Other Ambulatory Visit: Payer: Self-pay | Admitting: Family Medicine

## 2015-09-13 ENCOUNTER — Other Ambulatory Visit: Payer: Self-pay | Admitting: Family Medicine

## 2015-09-14 DIAGNOSIS — G4730 Sleep apnea, unspecified: Secondary | ICD-10-CM | POA: Diagnosis not present

## 2015-09-15 DIAGNOSIS — F418 Other specified anxiety disorders: Secondary | ICD-10-CM | POA: Diagnosis not present

## 2015-09-15 DIAGNOSIS — M75101 Unspecified rotator cuff tear or rupture of right shoulder, not specified as traumatic: Secondary | ICD-10-CM | POA: Diagnosis not present

## 2015-09-15 DIAGNOSIS — E559 Vitamin D deficiency, unspecified: Secondary | ICD-10-CM | POA: Diagnosis not present

## 2015-09-15 DIAGNOSIS — F329 Major depressive disorder, single episode, unspecified: Secondary | ICD-10-CM | POA: Diagnosis not present

## 2015-09-15 DIAGNOSIS — M1991 Primary osteoarthritis, unspecified site: Secondary | ICD-10-CM | POA: Diagnosis not present

## 2015-09-15 DIAGNOSIS — J449 Chronic obstructive pulmonary disease, unspecified: Secondary | ICD-10-CM | POA: Diagnosis not present

## 2015-09-15 DIAGNOSIS — N183 Chronic kidney disease, stage 3 (moderate): Secondary | ICD-10-CM | POA: Diagnosis not present

## 2015-09-15 DIAGNOSIS — I129 Hypertensive chronic kidney disease with stage 1 through stage 4 chronic kidney disease, or unspecified chronic kidney disease: Secondary | ICD-10-CM | POA: Diagnosis not present

## 2015-09-16 ENCOUNTER — Ambulatory Visit (INDEPENDENT_AMBULATORY_CARE_PROVIDER_SITE_OTHER): Payer: Commercial Managed Care - HMO | Admitting: Internal Medicine

## 2015-09-16 ENCOUNTER — Encounter: Payer: Self-pay | Admitting: Internal Medicine

## 2015-09-16 VITALS — BP 124/84 | HR 96 | Ht 65.0 in | Wt 246.0 lb

## 2015-09-16 DIAGNOSIS — J449 Chronic obstructive pulmonary disease, unspecified: Secondary | ICD-10-CM

## 2015-09-16 DIAGNOSIS — R0602 Shortness of breath: Secondary | ICD-10-CM | POA: Diagnosis not present

## 2015-09-16 DIAGNOSIS — G4733 Obstructive sleep apnea (adult) (pediatric): Secondary | ICD-10-CM

## 2015-09-16 DIAGNOSIS — J432 Centrilobular emphysema: Secondary | ICD-10-CM | POA: Diagnosis not present

## 2015-09-16 DIAGNOSIS — R0600 Dyspnea, unspecified: Secondary | ICD-10-CM

## 2015-09-16 DIAGNOSIS — F17210 Nicotine dependence, cigarettes, uncomplicated: Secondary | ICD-10-CM | POA: Diagnosis not present

## 2015-09-16 DIAGNOSIS — Z720 Tobacco use: Secondary | ICD-10-CM | POA: Diagnosis not present

## 2015-09-16 NOTE — Progress Notes (Signed)
[facility] Consultation      MRN# [id number] Katherine [name] 15-Feb-1940   CC: Chief Complaint  Patient presents with  . Follow-up    breathing good; SOB w/excertion;       Brief History: 75 year old female with moderate COPD, presents for COPD optimization, started on Spiriva with good tolerance added Breo with poor tolerance.   Events since last clinic visit: Patient presents today for follow-up visit of her OSA and COPD. She is still smoking about 3 cigarettes per day. She still endorses shortness of breath with exertion and a chronic morning smoker's cough. Cough is nonproductive. Today she is accompanied by her husband. At her last visit she was placed on a trial of inhaled corticosteroid, but could not tolerate it after 3 days of intense mild dryness and development of mild sores; all of which has resolved now.    Review of Systems  Constitutional: Negative for chills and fever.  Eyes: Negative for blurred vision.  Respiratory: Positive for cough, shortness of breath and wheezing. Negative for sputum production.   Cardiovascular: Negative for chest pain.  Gastrointestinal: Negative for heartburn.  Genitourinary: Negative for dysuria.  Musculoskeletal: Negative for myalgias.  Skin: Negative for rash.  Neurological: Negative for dizziness and headaches.  Endo/Heme/Allergies: Does not bruise/bleed easily.  Psychiatric/Behavioral: Negative for depression.      Allergies:  Enalapril maleate and Other  Physical Examination:  VS: BP 124/84 (BP Location: Right Arm, Cuff Size: Normal)   Pulse 96   Ht 5\' 5"  (1.651 m)   Wt 246 lb (111.6 kg)   SpO2 91%   BMI 40.94 kg/m   General Appearance: No distress  HEENT: PERRLA, no ptosis, no other lesions noticed Pulmonary:normal breath sounds., diaphragmatic excursion normal.No wheezing, No rales   Cardiovascular:  Normal S1,S2.  No m/r/g.     Abdomen:Exam: Benign, Soft, non-tender, No masses  Skin:    warm, no rashes, no ecchymosis  Extremities: normal, no cyanosis, clubbing, warm with normal capillary refill.      Assessment and Plan:75 year old with OSA seen in follow-up for OSA and COPD. Cigarette smoker Tobacco Cessation - Counseling regarding benefits of smoking cessation strategies was provided for more than 3 min. - Educated that at this time smoking- cessation represents the single most important step that patient can take to enhance the length and quality of live. - Educated patient regarding alternatives of behavior interventions, pharmacotherapy including NRT and non-nicotine therapy such, and combinations of both. - Patient at this time will try to quit on her own, down to 3 cigs per day.   COPD (chronic obstructive pulmonary disease) (HCC) Clinical symptoms consistent with COPD. Will schedule patient for Spirometry Unable to tolerate Breo and Arnuity due to dry mouth and throat irritation.   Current COPD regiment Spiriva 2L O2 at night She is to continue using her cane and walker.  Plan: -Avoid tobacco -Spiromety prior to follow-up visit - cont with spiriva - exercise as tolerated     OSA and COPD overlap syndrome ([place]) NSPG 09/09/15 = RDI 80  Patient counseled on COPD and OSA along with tobacco cessation Currently cannot tolerate ICS inhalers due to mouth dryness and sores. Will try tobacco cessation before adding another inhaler, she is down to about 3 cigs per day, so it is possible to quit.  Plan - tobacco cessation - CPAP titration study.    Updated Medication List Outpatient Encounter Prescriptions as of 09/16/2015  Medication Sig  . acetaminophen (TYLENOL) 650 MG CR  tablet Take 650-1,300 mg by mouth every 8 (eight) hours as needed for pain.   [name] albuterol (PROVENTIL HFA) 108 (90 BASE) MCG/ACT inhaler Inhale 2 puffs into the lungs every 4 (four) hours as needed for wheezing or shortness of breath.  [name] aspirin EC 81 MG tablet Take 81 mg by mouth daily.   . cetirizine (ZYRTEC) 10 MG tablet TAKE 1/2 TABLET BY MOUTH AT BEDTIME AS NEEDED  . cholecalciferol (VITAMIN D) 1000 units tablet Take 1,000 Units by mouth daily.  [name] FLUoxetine (PROZAC) 10 MG capsule Take 10 mg by mouth daily.  . hydrALAZINE (APRESOLINE) 25 MG tablet TAKE 1 TABLET(25 MG) BY MOUTH TWICE DAILY  . metoprolol tartrate (LOPRESSOR) 25 MG tablet Take 0.5 tablets (12.5 mg total) by mouth 2 (two) times daily. (Patient taking differently: Take 12.5 mg by mouth daily. )  . omeprazole (PRILOSEC) 40 MG capsule Take 40 mg by mouth daily.  [name] SPIRIVA HANDIHALER 18 MCG inhalation capsule PLACE 1 CAPSULE INTO INHALER AND INHALE ONCE DAILY  . [DISCONTINUED] FLUoxetine (PROZAC) 10 MG capsule Take 1 capsule (10 mg total) by mouth daily.   No facility-administered encounter medications on file as of 09/16/2015.     Orders for this visit: Orders Placed This Encounter  Procedures  . Spirometry with Graph    Order Specific Question:   Where should this test be performed?    Answer:   Other    Order Specific Question:   Basic spirometry    Answer:   Yes    Order Specific Question:   Spirometry pre & post bronchodilator    Answer:   No  . Cpap titration    Standing Status:   Future    Standing Expiration Date:   09/15/2016    Order Specific Question:   Where should this test be performed:    Answer:   Other    Thank  you for the visitation and for allowing  [facility] to assist in the care of your patient. Our recommendations are noted above.  Please contact us if we can be of further service.  Vilinda Boehringer, MD [facility] Office Number: [phone]  Note: This note was prepared with Dragon dictation along with smaller phrase technology. Any transcriptional errors that result from this process are unintentional.

## 2015-09-16 NOTE — Patient Instructions (Signed)
Follow up with Dr. Stevenson Clinch in: 3 months - CPAP titration study - avoid tobacco - diet exercise and weight lsos

## 2015-09-16 NOTE — Assessment & Plan Note (Signed)
Tobacco Cessation - Counseling regarding benefits of smoking cessation strategies was provided for more than 3 min. - Educated that at this time smoking- cessation represents the single most important step that patient can take to enhance the length and quality of live. - Educated patient regarding alternatives of behavior interventions, pharmacotherapy including NRT and non-nicotine therapy such, and combinations of both. - Patient at this time will try to quit on her own, down to 3 cigs per day.

## 2015-09-16 NOTE — Assessment & Plan Note (Signed)
NSPG 09/09/15 = RDI 80  Patient counseled on COPD and OSA along with tobacco cessation Currently cannot tolerate ICS inhalers due to mouth dryness and sores. Will try tobacco cessation before adding another inhaler, she is down to about 3 cigs per day, so it is possible to quit.  Plan - tobacco cessation - CPAP titration study.

## 2015-09-16 NOTE — Assessment & Plan Note (Signed)
Clinical symptoms consistent with COPD. Will schedule patient for Spirometry Unable to tolerate Breo and Arnuity due to dry mouth and throat irritation.   Current COPD regiment Spiriva 2L O2 at night She is to continue using her cane and walker.  Plan: -Avoid tobacco -Spiromety prior to follow-up visit - cont with spiriva - exercise as tolerated

## 2015-09-17 DIAGNOSIS — F418 Other specified anxiety disorders: Secondary | ICD-10-CM | POA: Diagnosis not present

## 2015-09-17 DIAGNOSIS — N183 Chronic kidney disease, stage 3 (moderate): Secondary | ICD-10-CM | POA: Diagnosis not present

## 2015-09-17 DIAGNOSIS — M75101 Unspecified rotator cuff tear or rupture of right shoulder, not specified as traumatic: Secondary | ICD-10-CM | POA: Diagnosis not present

## 2015-09-17 DIAGNOSIS — E559 Vitamin D deficiency, unspecified: Secondary | ICD-10-CM | POA: Diagnosis not present

## 2015-09-17 DIAGNOSIS — J449 Chronic obstructive pulmonary disease, unspecified: Secondary | ICD-10-CM | POA: Diagnosis not present

## 2015-09-17 DIAGNOSIS — I129 Hypertensive chronic kidney disease with stage 1 through stage 4 chronic kidney disease, or unspecified chronic kidney disease: Secondary | ICD-10-CM | POA: Diagnosis not present

## 2015-09-17 DIAGNOSIS — F329 Major depressive disorder, single episode, unspecified: Secondary | ICD-10-CM | POA: Diagnosis not present

## 2015-09-17 DIAGNOSIS — M1991 Primary osteoarthritis, unspecified site: Secondary | ICD-10-CM | POA: Diagnosis not present

## 2015-09-21 DIAGNOSIS — F329 Major depressive disorder, single episode, unspecified: Secondary | ICD-10-CM | POA: Diagnosis not present

## 2015-09-21 DIAGNOSIS — N183 Chronic kidney disease, stage 3 (moderate): Secondary | ICD-10-CM | POA: Diagnosis not present

## 2015-09-21 DIAGNOSIS — M75101 Unspecified rotator cuff tear or rupture of right shoulder, not specified as traumatic: Secondary | ICD-10-CM | POA: Diagnosis not present

## 2015-09-21 DIAGNOSIS — J449 Chronic obstructive pulmonary disease, unspecified: Secondary | ICD-10-CM | POA: Diagnosis not present

## 2015-09-21 DIAGNOSIS — F418 Other specified anxiety disorders: Secondary | ICD-10-CM | POA: Diagnosis not present

## 2015-09-21 DIAGNOSIS — E559 Vitamin D deficiency, unspecified: Secondary | ICD-10-CM | POA: Diagnosis not present

## 2015-09-21 DIAGNOSIS — M1991 Primary osteoarthritis, unspecified site: Secondary | ICD-10-CM | POA: Diagnosis not present

## 2015-09-21 DIAGNOSIS — I129 Hypertensive chronic kidney disease with stage 1 through stage 4 chronic kidney disease, or unspecified chronic kidney disease: Secondary | ICD-10-CM | POA: Diagnosis not present

## 2015-09-23 DIAGNOSIS — N183 Chronic kidney disease, stage 3 (moderate): Secondary | ICD-10-CM | POA: Diagnosis not present

## 2015-09-23 DIAGNOSIS — F418 Other specified anxiety disorders: Secondary | ICD-10-CM | POA: Diagnosis not present

## 2015-09-23 DIAGNOSIS — F329 Major depressive disorder, single episode, unspecified: Secondary | ICD-10-CM | POA: Diagnosis not present

## 2015-09-23 DIAGNOSIS — M1991 Primary osteoarthritis, unspecified site: Secondary | ICD-10-CM | POA: Diagnosis not present

## 2015-09-23 DIAGNOSIS — M75101 Unspecified rotator cuff tear or rupture of right shoulder, not specified as traumatic: Secondary | ICD-10-CM | POA: Diagnosis not present

## 2015-09-23 DIAGNOSIS — J449 Chronic obstructive pulmonary disease, unspecified: Secondary | ICD-10-CM | POA: Diagnosis not present

## 2015-09-23 DIAGNOSIS — E559 Vitamin D deficiency, unspecified: Secondary | ICD-10-CM | POA: Diagnosis not present

## 2015-09-23 DIAGNOSIS — I129 Hypertensive chronic kidney disease with stage 1 through stage 4 chronic kidney disease, or unspecified chronic kidney disease: Secondary | ICD-10-CM | POA: Diagnosis not present

## 2015-09-28 DIAGNOSIS — M75101 Unspecified rotator cuff tear or rupture of right shoulder, not specified as traumatic: Secondary | ICD-10-CM | POA: Diagnosis not present

## 2015-09-28 DIAGNOSIS — F329 Major depressive disorder, single episode, unspecified: Secondary | ICD-10-CM | POA: Diagnosis not present

## 2015-09-28 DIAGNOSIS — I129 Hypertensive chronic kidney disease with stage 1 through stage 4 chronic kidney disease, or unspecified chronic kidney disease: Secondary | ICD-10-CM | POA: Diagnosis not present

## 2015-09-28 DIAGNOSIS — M1991 Primary osteoarthritis, unspecified site: Secondary | ICD-10-CM | POA: Diagnosis not present

## 2015-09-28 DIAGNOSIS — F418 Other specified anxiety disorders: Secondary | ICD-10-CM | POA: Diagnosis not present

## 2015-09-28 DIAGNOSIS — N183 Chronic kidney disease, stage 3 (moderate): Secondary | ICD-10-CM | POA: Diagnosis not present

## 2015-09-28 DIAGNOSIS — E559 Vitamin D deficiency, unspecified: Secondary | ICD-10-CM | POA: Diagnosis not present

## 2015-09-28 DIAGNOSIS — J449 Chronic obstructive pulmonary disease, unspecified: Secondary | ICD-10-CM | POA: Diagnosis not present

## 2015-09-30 DIAGNOSIS — F329 Major depressive disorder, single episode, unspecified: Secondary | ICD-10-CM | POA: Diagnosis not present

## 2015-09-30 DIAGNOSIS — M75101 Unspecified rotator cuff tear or rupture of right shoulder, not specified as traumatic: Secondary | ICD-10-CM | POA: Diagnosis not present

## 2015-09-30 DIAGNOSIS — F418 Other specified anxiety disorders: Secondary | ICD-10-CM | POA: Diagnosis not present

## 2015-09-30 DIAGNOSIS — E559 Vitamin D deficiency, unspecified: Secondary | ICD-10-CM | POA: Diagnosis not present

## 2015-09-30 DIAGNOSIS — N183 Chronic kidney disease, stage 3 (moderate): Secondary | ICD-10-CM | POA: Diagnosis not present

## 2015-09-30 DIAGNOSIS — J449 Chronic obstructive pulmonary disease, unspecified: Secondary | ICD-10-CM | POA: Diagnosis not present

## 2015-09-30 DIAGNOSIS — I129 Hypertensive chronic kidney disease with stage 1 through stage 4 chronic kidney disease, or unspecified chronic kidney disease: Secondary | ICD-10-CM | POA: Diagnosis not present

## 2015-09-30 DIAGNOSIS — M1991 Primary osteoarthritis, unspecified site: Secondary | ICD-10-CM | POA: Diagnosis not present

## 2015-10-04 ENCOUNTER — Other Ambulatory Visit: Payer: Self-pay | Admitting: Family Medicine

## 2015-10-05 DIAGNOSIS — J449 Chronic obstructive pulmonary disease, unspecified: Secondary | ICD-10-CM | POA: Diagnosis not present

## 2015-10-06 ENCOUNTER — Other Ambulatory Visit: Payer: Self-pay | Admitting: Unknown Physician Specialty

## 2015-10-07 NOTE — Telephone Encounter (Signed)
Your patient 

## 2015-10-07 NOTE — Telephone Encounter (Signed)
Pt's husband came by stated pt is completely out of her high bp medication. Pt needs this refilled ASAP. Pharm [facility] in [place]. Thanks.

## 2015-10-07 NOTE — Telephone Encounter (Signed)
Routing to [place] to review since Malachy Mood is gone for the day.

## 2015-10-08 NOTE — Telephone Encounter (Signed)
Routing to Cheryl

## 2015-11-04 ENCOUNTER — Other Ambulatory Visit: Payer: Self-pay | Admitting: Unknown Physician Specialty

## 2015-11-05 DIAGNOSIS — J449 Chronic obstructive pulmonary disease, unspecified: Secondary | ICD-10-CM | POA: Diagnosis not present

## 2015-11-08 DIAGNOSIS — M25561 Pain in right knee: Secondary | ICD-10-CM | POA: Diagnosis not present

## 2015-11-08 DIAGNOSIS — M1711 Unilateral primary osteoarthritis, right knee: Secondary | ICD-10-CM | POA: Diagnosis not present

## 2015-11-09 ENCOUNTER — Other Ambulatory Visit: Payer: Self-pay | Admitting: Unknown Physician Specialty

## 2015-11-09 ENCOUNTER — Other Ambulatory Visit: Payer: Self-pay | Admitting: Family Medicine

## 2015-11-10 ENCOUNTER — Ambulatory Visit (INDEPENDENT_AMBULATORY_CARE_PROVIDER_SITE_OTHER): Payer: Commercial Managed Care - HMO | Admitting: Unknown Physician Specialty

## 2015-11-10 ENCOUNTER — Encounter: Payer: Self-pay | Admitting: Unknown Physician Specialty

## 2015-11-10 VITALS — BP 144/92 | HR 80 | Temp 98.6°F | Ht 61.6 in | Wt 248.6 lb

## 2015-11-10 DIAGNOSIS — M25561 Pain in right knee: Secondary | ICD-10-CM | POA: Diagnosis not present

## 2015-11-10 DIAGNOSIS — I10 Essential (primary) hypertension: Secondary | ICD-10-CM | POA: Diagnosis not present

## 2015-11-10 MED ORDER — HYDRALAZINE HCL 25 MG PO TABS: 25.0000 mg | ORAL_TABLET | Freq: Two times a day (BID) | ORAL | 5 refills | Status: DC

## 2015-11-10 MED ORDER — CETIRIZINE HCL 10 MG PO TABS: 5.0000 mg | ORAL_TABLET | Freq: Every day | ORAL | 12 refills | Status: DC

## 2015-11-10 NOTE — Progress Notes (Signed)
BP (!) 144/92 (BP Location: Left Arm, Patient Position: Sitting, Cuff Size: Large)   Pulse 80   Temp 98.6 F (37 C)   Ht 5' 1.6" (1.565 m) Comment: pt had shoes on  Wt 248 lb 9.6 oz (112.8 kg) Comment: pt had shoes on  SpO2 95%   BMI 46.06 kg/m    Subjective:    Patient ID: Katherine [name], female    DOB: April 01, 1940, 75 y.o.   MRN: [id number]  HPI: Katherine [name] is a 75 y.o. female  Chief Complaint  Patient presents with  . Knee Pain    pt states her right knee has been hurting for a while but has recently gotten worse, went to urgent care and had x-rays   . Medication Refill    cetirizine and hydralizine   Right knee pain Went to urgent care 2 days ago and has severe arthritis.  It is really painful and didn't get any medication to urgent care.  She is offered a steroid injection but states "it won't help."  They told her she has "bone on bone" arthritis.  She is currently taking Meloxicam given by Urgent Care.     Hypertension Out of Hydralazine.  She would like a refill.    Allergies Wants a refill of Cetirizine which she takes for allergies.    Relevant past medical, surgical, family and social history reviewed and updated as indicated. Interim medical history since our last visit reviewed. Allergies and medications reviewed and updated.  Review of Systems  Per HPI unless specifically indicated above     Objective:    BP (!) 144/92 (BP Location: Left Arm, Patient Position: Sitting, Cuff Size: Large)   Pulse 80   Temp 98.6 F (37 C)   Ht 5' 1.6" (1.565 m) Comment: pt had shoes on  Wt 248 lb 9.6 oz (112.8 kg) Comment: pt had shoes on  SpO2 95%   BMI 46.06 kg/m   Wt Readings from Last 3 Encounters:  11/10/15 248 lb 9.6 oz (112.8 kg)  09/16/15 246 lb (111.6 kg)  09/07/15 249 lb 12.8 oz (113.3 kg)    Physical Exam  Constitutional: She is oriented to person, place, and time. She appears well-developed and well-nourished. No distress.  HENT:  Head:  Normocephalic and atraumatic.  Eyes: Conjunctivae and lids are normal. Right eye exhibits no discharge. Left eye exhibits no discharge. No scleral icterus.  Pulmonary/Chest: Effort normal.  Abdominal: Normal appearance. There is no splenomegaly or hepatomegaly.  Musculoskeletal: Normal range of motion.  Neurological: She is alert and oriented to person, place, and time.  Skin: Skin is intact. No rash noted. No pallor.  Psychiatric: She has a normal mood and affect. Her behavior is normal. Judgment and thought content normal.   STEROID INJECTION  Procedure: Knee Intraarticular Steroid Injection   Description: After verbal consent and patient ed on Area prepped and draped using  semi-sterile technique. Using a anterior  approach, a mixture of 4 cc of  1% Marcaine & 1 cc of Kenalog 40 was injected into knee joint.  A bandage was then placed over the injection site. Complications:  none Post Procedure Instructions: To the ER if any symptoms of erythema or swelling.     Results for orders placed or performed in visit on 09/07/15  Comprehensive metabolic panel  Result Value Ref Range   Glucose 100 (H) 65 - 99 mg/dL   BUN 19 8 - 27 mg/dL   Creatinine, Ser 1.60 (H) 0.57 -  1.00 mg/dL   GFR calc non Af Amer 32 (L) >59 mL/min/1.73   GFR calc Af Amer 37 (L) >59 mL/min/1.73   BUN/Creatinine Ratio 12 12 - 28   Sodium 140 134 - 144 mmol/L   Potassium 4.9 3.5 - 5.2 mmol/L   Chloride 100 96 - 106 mmol/L   CO2 24 18 - 29 mmol/L   Calcium 9.0 8.7 - 10.3 mg/dL   Total Protein 6.4 6.0 - 8.5 g/dL   Albumin 3.7 3.5 - 4.8 g/dL   Globulin, Total 2.7 1.5 - 4.5 g/dL   Albumin/Globulin Ratio 1.4 1.2 - 2.2   Bilirubin Total <0.2 0.0 - 1.2 mg/dL   Alkaline Phosphatase 84 39 - 117 IU/L   AST 13 0 - 40 IU/L   ALT 9 0 - 32 IU/L  Bayer DCA Hb A1c Waived  Result Value Ref Range   Bayer DCA Hb A1c Waived 5.8 <7.0 %      Assessment & Plan:   Problem List Items Addressed This Visit      Unprioritized    Essential hypertension    Refill Hydralazine      Relevant Medications   hydrALAZINE (APRESOLINE) 25 MG tablet   Right knee pain - Primary    Refer to Orthopedics.  Continue with Meloxicam.  Hopefully injection will give some relief.  She did feel some immediate relief      Relevant Orders   Ambulatory referral to Orthopedic Surgery    Other Visit Diagnoses   None.      Follow up plan: Return if symptoms worsen or fail to improve.

## 2015-11-10 NOTE — Assessment & Plan Note (Signed)
Refill Hydralazine.

## 2015-11-10 NOTE — Assessment & Plan Note (Addendum)
Refer to Orthopedics.  Continue with Meloxicam.  Hopefully injection will give some relief.  She did feel some immediate relief

## 2015-11-27 ENCOUNTER — Encounter: Payer: Self-pay | Admitting: Internal Medicine

## 2015-11-27 ENCOUNTER — Emergency Department: Payer: Commercial Managed Care - HMO

## 2015-11-27 ENCOUNTER — Inpatient Hospital Stay
Admission: EM | Admit: 2015-11-27 | Discharge: 2015-11-29 | DRG: 190 | Disposition: A | Discharge status: Home Health | Payer: Commercial Managed Care - HMO | Attending: Internal Medicine | Admitting: Internal Medicine

## 2015-11-27 DIAGNOSIS — J9621 Acute and chronic respiratory failure with hypoxia: Secondary | ICD-10-CM | POA: Diagnosis present

## 2015-11-27 DIAGNOSIS — F419 Anxiety disorder, unspecified: Secondary | ICD-10-CM | POA: Diagnosis present

## 2015-11-27 DIAGNOSIS — Z7982 Long term (current) use of aspirin: Secondary | ICD-10-CM

## 2015-11-27 DIAGNOSIS — F17210 Nicotine dependence, cigarettes, uncomplicated: Secondary | ICD-10-CM | POA: Diagnosis not present

## 2015-11-27 DIAGNOSIS — F329 Major depressive disorder, single episode, unspecified: Secondary | ICD-10-CM | POA: Diagnosis present

## 2015-11-27 DIAGNOSIS — E669 Obesity, unspecified: Secondary | ICD-10-CM | POA: Diagnosis present

## 2015-11-27 DIAGNOSIS — J440 Chronic obstructive pulmonary disease with acute lower respiratory infection: Secondary | ICD-10-CM | POA: Diagnosis present

## 2015-11-27 DIAGNOSIS — K5909 Other constipation: Secondary | ICD-10-CM | POA: Diagnosis present

## 2015-11-27 DIAGNOSIS — I129 Hypertensive chronic kidney disease with stage 1 through stage 4 chronic kidney disease, or unspecified chronic kidney disease: Secondary | ICD-10-CM | POA: Diagnosis present

## 2015-11-27 DIAGNOSIS — Z888 Allergy status to other drugs, medicaments and biological substances status: Secondary | ICD-10-CM | POA: Diagnosis not present

## 2015-11-27 DIAGNOSIS — E559 Vitamin D deficiency, unspecified: Secondary | ICD-10-CM | POA: Diagnosis present

## 2015-11-27 DIAGNOSIS — Z91030 Bee allergy status: Secondary | ICD-10-CM | POA: Diagnosis not present

## 2015-11-27 DIAGNOSIS — J9600 Acute respiratory failure, unspecified whether with hypoxia or hypercapnia: Secondary | ICD-10-CM

## 2015-11-27 DIAGNOSIS — J209 Acute bronchitis, unspecified: Secondary | ICD-10-CM | POA: Diagnosis present

## 2015-11-27 DIAGNOSIS — R0602 Shortness of breath: Secondary | ICD-10-CM

## 2015-11-27 DIAGNOSIS — Z79899 Other long term (current) drug therapy: Secondary | ICD-10-CM

## 2015-11-27 DIAGNOSIS — G4733 Obstructive sleep apnea (adult) (pediatric): Secondary | ICD-10-CM | POA: Diagnosis present

## 2015-11-27 DIAGNOSIS — K219 Gastro-esophageal reflux disease without esophagitis: Secondary | ICD-10-CM | POA: Diagnosis not present

## 2015-11-27 DIAGNOSIS — R05 Cough: Secondary | ICD-10-CM | POA: Diagnosis not present

## 2015-11-27 DIAGNOSIS — J441 Chronic obstructive pulmonary disease with (acute) exacerbation: Secondary | ICD-10-CM

## 2015-11-27 DIAGNOSIS — Z6838 Body mass index (BMI) 38.0-38.9, adult: Secondary | ICD-10-CM

## 2015-11-27 DIAGNOSIS — N183 Chronic kidney disease, stage 3 (moderate): Secondary | ICD-10-CM | POA: Diagnosis not present

## 2015-11-27 LAB — BASIC METABOLIC PANEL
Anion gap: 8 (ref 5–15)
BUN: 17 mg/dL (ref 6–20)
CO2: 26 mmol/L (ref 22–32)
CREATININE: 1.57 mg/dL — AB (ref 0.44–1.00)
Calcium: 9.3 mg/dL (ref 8.9–10.3)
Chloride: 106 mmol/L (ref 101–111)
GFR calc Af Amer: 36 mL/min — ABNORMAL LOW (ref >60)
GFR, EST NON AFRICAN AMERICAN: 31 mL/min — AB (ref >60)
Glucose, Bld: 124 mg/dL — ABNORMAL HIGH (ref 65–99)
POTASSIUM: 4.1 mmol/L (ref 3.5–5.1)
SODIUM: 140 mmol/L (ref 135–145)

## 2015-11-27 LAB — CBC
HEMATOCRIT: 40.8 % (ref 35.0–47.0)
HEMOGLOBIN: 13.8 g/dL (ref 12.0–16.0)
MCH: 31.4 pg (ref 26.0–34.0)
MCHC: 33.7 g/dL (ref 32.0–36.0)
MCV: 93 fL (ref 80.0–100.0)
Platelets: 213 10*3/uL (ref 150–440)
RBC: 4.38 MIL/uL (ref 3.80–5.20)
RDW: 16 % — ABNORMAL HIGH (ref 11.5–14.5)
WBC: 4.8 10*3/uL (ref 3.6–11.0)

## 2015-11-27 LAB — TROPONIN I

## 2015-11-27 LAB — BRAIN NATRIURETIC PEPTIDE: B Natriuretic Peptide: 64 pg/mL (ref 0.0–100.0)

## 2015-11-27 MED ORDER — DEXTROSE 5 % IV SOLN: 1.0000 g | INTRAVENOUS | Status: DC

## 2015-11-27 MED ORDER — HEPARIN SODIUM (PORCINE) 5000 UNIT/ML IJ SOLN
5000.0000 [IU] | Freq: Three times a day (TID) | INTRAMUSCULAR | Status: DC
  Administered 2015-11-27 – 2015-11-29 (×5): 5000 [IU] via SUBCUTANEOUS
  Filled 2015-11-27 (×5): qty 1

## 2015-11-27 MED ORDER — ONDANSETRON HCL 4 MG/2ML IJ SOLN: 4.0000 mg | Freq: Four times a day (QID) | INTRAMUSCULAR | Status: DC | PRN

## 2015-11-27 MED ORDER — ALBUTEROL SULFATE (2.5 MG/3ML) 0.083% IN NEBU
INHALATION_SOLUTION | RESPIRATORY_TRACT | Status: AC
  Administered 2015-11-27: 2.5 mg via RESPIRATORY_TRACT
  Filled 2015-11-27: qty 12

## 2015-11-27 MED ORDER — TIOTROPIUM BROMIDE MONOHYDRATE 18 MCG IN CAPS
18.0000 ug | ORAL_CAPSULE | Freq: Every day | RESPIRATORY_TRACT | Status: DC
  Administered 2015-11-28 – 2015-11-29 (×2): 18 ug via RESPIRATORY_TRACT
  Filled 2015-11-27: qty 5

## 2015-11-27 MED ORDER — METHYLPREDNISOLONE SODIUM SUCC 125 MG IJ SOLR
125.0000 mg | Freq: Once | INTRAMUSCULAR | Status: AC
  Administered 2015-11-27: 125 mg via INTRAVENOUS

## 2015-11-27 MED ORDER — IPRATROPIUM-ALBUTEROL 0.5-2.5 (3) MG/3ML IN SOLN
RESPIRATORY_TRACT | Status: AC
  Filled 2015-11-27: qty 3

## 2015-11-27 MED ORDER — SODIUM CHLORIDE 0.9 % IV SOLN
INTRAVENOUS | Status: DC
  Administered 2015-11-27 – 2015-11-28 (×2): via INTRAVENOUS

## 2015-11-27 MED ORDER — IPRATROPIUM-ALBUTEROL 0.5-2.5 (3) MG/3ML IN SOLN
3.0000 mL | Freq: Four times a day (QID) | RESPIRATORY_TRACT | Status: DC
  Administered 2015-11-27 – 2015-11-29 (×7): 3 mL via RESPIRATORY_TRACT
  Filled 2015-11-27 (×8): qty 3

## 2015-11-27 MED ORDER — PANTOPRAZOLE SODIUM 40 MG PO TBEC
40.0000 mg | DELAYED_RELEASE_TABLET | Freq: Every day | ORAL | Status: DC
  Administered 2015-11-28 – 2015-11-29 (×2): 40 mg via ORAL
  Filled 2015-11-27 (×2): qty 1

## 2015-11-27 MED ORDER — BISACODYL 10 MG RE SUPP: 10.0000 mg | Freq: Every day | RECTAL | Status: DC | PRN

## 2015-11-27 MED ORDER — ALBUTEROL SULFATE HFA 108 (90 BASE) MCG/ACT IN AERS: 2.0000 | INHALATION_SPRAY | RESPIRATORY_TRACT | Status: DC | PRN

## 2015-11-27 MED ORDER — IPRATROPIUM-ALBUTEROL 0.5-2.5 (3) MG/3ML IN SOLN
3.0000 mL | Freq: Once | RESPIRATORY_TRACT | Status: AC
  Administered 2015-11-27: 3 mL via RESPIRATORY_TRACT

## 2015-11-27 MED ORDER — ASPIRIN EC 81 MG PO TBEC
81.0000 mg | DELAYED_RELEASE_TABLET | Freq: Every day | ORAL | Status: DC
  Administered 2015-11-28 – 2015-11-29 (×2): 81 mg via ORAL
  Filled 2015-11-27 (×2): qty 1

## 2015-11-27 MED ORDER — FLUOXETINE HCL 10 MG PO CAPS
10.0000 mg | ORAL_CAPSULE | Freq: Every day | ORAL | Status: DC
  Administered 2015-11-28 – 2015-11-29 (×2): 10 mg via ORAL
  Filled 2015-11-27 (×2): qty 1

## 2015-11-27 MED ORDER — METOPROLOL TARTRATE 25 MG PO TABS
12.5000 mg | ORAL_TABLET | Freq: Every day | ORAL | Status: DC
  Administered 2015-11-28 – 2015-11-29 (×2): 12.5 mg via ORAL
  Filled 2015-11-27 (×2): qty 1

## 2015-11-27 MED ORDER — ONDANSETRON HCL 4 MG PO TABS: 4.0000 mg | ORAL_TABLET | Freq: Four times a day (QID) | ORAL | Status: DC | PRN

## 2015-11-27 MED ORDER — CEFTRIAXONE SODIUM-DEXTROSE 1-3.74 GM-% IV SOLR
1.0000 g | Freq: Every day | INTRAVENOUS | Status: DC
  Administered 2015-11-27 – 2015-11-28 (×2): 1 g via INTRAVENOUS
  Filled 2015-11-27 (×2): qty 50

## 2015-11-27 MED ORDER — VITAMIN D 1000 UNITS PO TABS
1000.0000 [IU] | ORAL_TABLET | Freq: Every day | ORAL | Status: DC
  Administered 2015-11-28 – 2015-11-29 (×2): 1000 [IU] via ORAL
  Filled 2015-11-27 (×2): qty 1

## 2015-11-27 MED ORDER — MAGNESIUM SULFATE 2 GM/50ML IV SOLN
2.0000 g | Freq: Once | INTRAVENOUS | Status: AC
  Administered 2015-11-27: 2 g via INTRAVENOUS
  Filled 2015-11-27: qty 50

## 2015-11-27 MED ORDER — ACETAMINOPHEN 650 MG RE SUPP: 650.0000 mg | Freq: Four times a day (QID) | RECTAL | Status: DC | PRN

## 2015-11-27 MED ORDER — METHYLPREDNISOLONE SODIUM SUCC 125 MG IJ SOLR
60.0000 mg | Freq: Four times a day (QID) | INTRAMUSCULAR | Status: DC
  Administered 2015-11-28 (×2): 60 mg via INTRAVENOUS
  Filled 2015-11-27 (×2): qty 2

## 2015-11-27 MED ORDER — MONTELUKAST SODIUM 10 MG PO TABS
10.0000 mg | ORAL_TABLET | Freq: Every day | ORAL | Status: DC
  Administered 2015-11-27 – 2015-11-28 (×2): 10 mg via ORAL
  Filled 2015-11-27 (×2): qty 1

## 2015-11-27 MED ORDER — ACETAMINOPHEN 325 MG PO TABS: 650.0000 mg | ORAL_TABLET | Freq: Four times a day (QID) | ORAL | Status: DC | PRN

## 2015-11-27 MED ORDER — HYDRALAZINE HCL 25 MG PO TABS
25.0000 mg | ORAL_TABLET | Freq: Two times a day (BID) | ORAL | Status: DC
  Administered 2015-11-27 – 2015-11-29 (×4): 25 mg via ORAL
  Filled 2015-11-27 (×4): qty 1

## 2015-11-27 MED ORDER — METHYLPREDNISOLONE SODIUM SUCC 125 MG IJ SOLR
INTRAMUSCULAR | Status: AC
  Administered 2015-11-27: 18:00:00 via INTRAVENOUS
  Filled 2015-11-27: qty 2

## 2015-11-27 MED ORDER — ALBUTEROL SULFATE (2.5 MG/3ML) 0.083% IN NEBU
2.5000 mg | INHALATION_SOLUTION | RESPIRATORY_TRACT | Status: DC | PRN
  Administered 2015-11-27: 2.5 mg via RESPIRATORY_TRACT

## 2015-11-27 MED ORDER — LORATADINE 10 MG PO TABS
10.0000 mg | ORAL_TABLET | Freq: Every day | ORAL | Status: DC
  Administered 2015-11-28 – 2015-11-29 (×2): 10 mg via ORAL
  Filled 2015-11-27 (×2): qty 1

## 2015-11-27 MED ORDER — MOMETASONE FURO-FORMOTEROL FUM 100-5 MCG/ACT IN AERO
2.0000 | INHALATION_SPRAY | Freq: Two times a day (BID) | RESPIRATORY_TRACT | Status: DC
  Administered 2015-11-27 – 2015-11-29 (×4): 2 via RESPIRATORY_TRACT
  Filled 2015-11-27: qty 8.8

## 2015-11-27 MED ORDER — DOCUSATE SODIUM 100 MG PO CAPS
100.0000 mg | ORAL_CAPSULE | Freq: Two times a day (BID) | ORAL | Status: DC
  Administered 2015-11-28 – 2015-11-29 (×3): 100 mg via ORAL
  Filled 2015-11-27 (×4): qty 1

## 2015-11-27 MED ORDER — ALBUTEROL (5 MG/ML) CONTINUOUS INHALATION SOLN
10.0000 mg/h | INHALATION_SOLUTION | Freq: Once | RESPIRATORY_TRACT | Status: AC
  Administered 2015-11-27: 10 mg/h via RESPIRATORY_TRACT
  Filled 2015-11-27: qty 20

## 2015-11-27 MED ORDER — HYDROCODONE-ACETAMINOPHEN 5-325 MG PO TABS: 1.0000 | ORAL_TABLET | ORAL | Status: DC | PRN

## 2015-11-27 NOTE — ED Provider Notes (Signed)
[facility] Emergency Department Provider Note  ____________________________________________  Time seen: Approximately 1:24 PM  I have reviewed the triage vital signs and the nursing notes.   HISTORY  Chief Complaint Chest Pain and Shortness of Breath    HPI Alfrieda Greenleaf is a 75 y.o. female with a history of COPD and ongoing tobacco abuse, HTN, left ventricular hypertrophy, presenting for cough, wheezing, and shortness of breath. The patient reports that for the past 3 days she has had a persistent cough that is productive of thin yellow sputum without rhinorrhea, congestion, ear pain or sore throat. She is not had any fever or chills. Over the past day, she has had worsening shortness of breath with wheezing. She denies any chest pain or tightness, palpitations, lightheadedness or fainting.   Past Medical History:  Diagnosis Date  . Angioedema   . Anxiety   . Anxiety and depression   . Chronic constipation   . Chronic kidney disease    stage 3  . COPD (chronic obstructive pulmonary disease) ([place])   . Gallstones   . GERD (gastroesophageal reflux disease)   . Hypertension   . Left ventricular hypertrophy   . Osteoarthritis   . Prurigo nodularis   . Tobacco abuse   . Vitamin D deficiency disease     Patient Active Problem List   Diagnosis Date Noted  . Right knee pain 11/10/2015  . Cigarette smoker 09/16/2015  . OSA and COPD overlap syndrome ([place]) 09/16/2015  . Carpal tunnel syndrome of left wrist 09/07/2015  . Rotator cuff syndrome 09/07/2015  . 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 Sleep disturbance 04/14/2015  . Coronary artery disease 03/14/2015  . 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 COPD exacerbation ([place]) 02/10/2015  . Chest pain 09/16/2014  . 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 Anxiety   . Tobacco abuse   . Prurigo nodularis   . Angioedema   . Hypertensive heart disease   . GERD (gastroesophageal reflux disease)   . COPD (chronic  obstructive pulmonary disease) ([place])   . Osteoarthritis   . Vitamin D deficiency disease   . Chronic constipation   . Chronic kidney disease   . SOB (shortness of breath) 05/20/2013  . Right foot pain 05/20/2013  . LVH (left ventricular hypertrophy) 05/20/2013  . Essential hypertension 05/20/2013  . Morbid obesity ([place]) 05/20/2013    Past Surgical History:  Procedure Laterality Date  . CHOLECYSTECTOMY    . POLYPECTOMY  11/2011   vocal cord    Current Outpatient Rx  . Order #: [id number] Class: Historical Med  . Order #: [id number] Class: Print  . Order #: [id number] Class: Historical Med  . Order #: [id number] Class: Normal  . Order #: [id number] Class: Historical Med  . Order #: [id number] Class: Historical Med  . Order #: [id number] Class: Normal  . Order #: [id number] Class: Historical Med  . Order #: [id number] Class: Normal  . Order #: [id number] Class: Historical Med  . Order #: [id number] Class: Normal    Allergies Enalapril maleate and Other  Family History  Problem Relation Age of Onset  . Alcohol abuse Mother   . Sickle cell anemia Daughter   . Hypertension Son   . Cancer Neg Hx   . COPD Neg Hx   . Diabetes Neg Hx   . Heart disease Neg Hx   . Stroke Neg Hx     Social History Social History  Substance Use Topics  . Smoking status: Light Tobacco Smoker    Packs/day: 1.00    Years: 52.00  Types: Cigarettes  . Smokeless tobacco: Never Used  . Alcohol use No     Comment: rare    Review of Systems Constitutional: No fever/chills.No lightheadedness or syncope. Eyes: No visual changes. No eye discharge. ENT: No sore throat. No congestion or rhinorrhea. Cardiovascular: Denies chest pain. Denies palpitations. Respiratory: Pos shortness of breath.  positive productive cough. Gastrointestinal: No abdominal pain.  No nausea, no vomiting.  No diarrhea.  No constipation. Genitourinary: Negative for dysuria. Musculoskeletal: Negative for back pain. Skin: Negative for  rash. Neurological: Negative for headaches. No focal numbness, tingling or weakness.   10-point ROS otherwise negative.  ____________________________________________   PHYSICAL EXAM:  VITAL SIGNS: ED Triage Vitals [11/27/15 1241]  Enc Vitals Group     BP (!) 148/115     Pulse Rate (!) 103     Resp (!) 22     Temp 98.7 F (37.1 C)     Temp Source Oral     SpO2 98 %     Weight 239 lb (108.4 kg)     Height 5\' 6"  (1.676 m)     Head Circumference      Peak Flow      Pain Score 3     Pain Loc      Pain Edu?      Excl. in [place]?     Constitutional: Alert and oriented. Chronically ill appearing and tachypnea but nontoxic. Able to answer questions appropriately. Eyes: Conjunctivae are normal.  EOMI. No scleral icterus. No eye discharge. Head: Atraumatic. Nose: No congestion/rhinnorhea. Mouth/Throat: Mucous membranes are moist.  Neck: No stridor.  Supple.  No JVD. No meningismus. Cardiovascular: Fast rate, regular rhythm. No murmurs, rubs or gallops.  Respiratory: Positive tachypnea with accessory muscle use but no retractions. Lungs have wheezing diffusely bilaterally without rales or rhonchi. The patient is able to speak in 4-5 sentences.  Gastrointestinal: Obese. Soft, nontender and nondistended.  No guarding or rebound.  No peritoneal signs. Musculoskeletal: No LE edema. No ttp in the calves or palpable cords.  Negative Homan's sign. Neurologic:  A&Ox3.  Speech is clear.  Face and smile are symmetric.  EOMI.  Moves all extremities well. Skin:  Skin is warm, dry and intact. No rash noted. Psychiatric: Mood and affect are normal. Speech and behavior are normal.  Normal judgement.  ____________________________________________   LABS (all labs ordered are listed, but only abnormal results are displayed)  Labs Reviewed  BLOOD GAS, VENOUS - Abnormal; Notable for the following:       Result Value   Bicarbonate 28.1 (*)    All other components within normal limits  CBC -  Abnormal; Notable for the following:    RDW 16.0 (*)    All other components within normal limits  BASIC METABOLIC PANEL - Abnormal; Notable for the following:    Glucose, Bld 124 (*)    Creatinine, Ser 1.57 (*)    GFR calc non Af Amer 31 (*)    GFR calc Af Amer 36 (*)    All other components within normal limits  BRAIN NATRIURETIC PEPTIDE  TROPONIN I   ____________________________________________  EKG  ED ECG REPORT I, Eula Listen, the attending physician, personally viewed and interpreted this ECG.   Date: 11/27/2015  EKG Time: 1235  Rate: 105  Rhythm: sinus tachycardia  Axis: Leftward  Intervals:none  ST&T Change: Nonspecific T-wave inversion in V1. No ST elevation.  ____________________________________________  RADIOLOGY  Dg Chest 2 View  Result Date: 11/27/2015 CLINICAL DATA:  Cough with shortness of breath. EXAM: CHEST  2 VIEW COMPARISON:  Chest x-ray dated 06/21/2015. FINDINGS: Heart size is normal. Overall cardiomediastinal silhouette is stable. Atherosclerotic changes again noted at the aortic arch. Mildly prominent interstitial markings are unchanged, presumably chronic. Lungs otherwise clear. No evidence of pneumonia. No pleural effusion or pneumothorax seen. Mild degenerative spurring noted within the thoracic spine. No acute or suspicious osseous finding. IMPRESSION: No active cardiopulmonary disease.  No evidence of pneumonia. Aortic atherosclerosis. Electronically Signed   By: Franki Cabot M.D.   On: 11/27/2015 14:21    ____________________________________________   PROCEDURES  Procedure(s) performed: None  Procedures  Critical Care performed: No ____________________________________________   INITIAL IMPRESSION / ASSESSMENT AND PLAN / ED COURSE  Pertinent labs & imaging results that were available during my care of the patient were reviewed by me and considered in my medical decision making (see chart for details).  75 y.o. female with  a history of COPD and ongoing tobacco abuse, left ventricular hypertrophy, presenting with 3 days of productive cough, wheezing and shortness of breath. Overall, the patient does have significant wheezing but is not in extremis. The most likely etiology of her symptoms is a COPD exacerbation. She does not give a significant history of fever, or other focal infectious etiology but will get a chest x-ray to rule out pneumonia. In addition, the patient does have some cardiac history, and while less likely, ACS, MI or not excluded. We'll get a troponin, and BNP as well. In the meantime I will initiate aggressive management of COPD exacerbation with DuoNeb and steroids, and reevaluate the patient. A blood gas is also pending.  ----------------------------------------- 2:55 PM on 11/27/2015 -----------------------------------------  At this time, the patient has received a DuoNeb and steroids, but continues to be tachypnea with some accessory muscle use. She has opened up her airways more than on arrival, but continues to have diffuse wheezing. I will repeat a continuous neb, and plan to admit her to the hospital. Her O2 sat is 90-91% but her VBG is reassuring.  Troponin is negative. ____________________________________________  FINAL CLINICAL IMPRESSION(S) / ED DIAGNOSES  Final diagnoses:  COPD exacerbation ([place])    Clinical Course      NEW MEDICATIONS STARTED DURING THIS VISIT:  New Prescriptions   No medications on file      Eula Listen, MD 11/27/15 1455

## 2015-11-27 NOTE — Progress Notes (Signed)
Admitted from the ED with respiratory failure. Lung sounds positive for wheezing, diminished breath sounds. Pt ate dinner in bed with no acute distress. Dyspneic upon minimal exertion with no apparent distress.

## 2015-11-27 NOTE — ED Notes (Signed)
This RN helped pt stand up and walk down hallway, pt did not tolerate well, oxygens 85% while walking an pt short of breath

## 2015-11-27 NOTE — H&P (Signed)
History and Physical    Adelyne Marchese PTW:[id number] DOB: 1940-06-25 DOA: 11/27/2015  Referring physician: Dr. Mariea Clonts PCP: [name], NP  Specialists: none  Chief Complaint: SOB  HPI: Davionna Blacksher is a 75 y.o. female has a past medical history significant for obesity, OSA, COPD, and HTN now with 2-3 day hx of worsening SOB and cough. No fever. Denies CP. In ER, CXR stable but pt had RA sat of 82% with exertion despite IV steroids and SVN's. Still SOB and wheezing. She is now admitted.  Review of Systems: The patient denies anorexia, fever, weight loss,, vision loss, decreased hearing, hoarseness, chest pain, syncope,  peripheral edema, balance deficits, hemoptysis, abdominal pain, melena, hematochezia, severe indigestion/heartburn, hematuria, incontinence, genital sores, muscle weakness, suspicious skin lesions, transient blindness, difficulty walking, depression, unusual weight change, abnormal bleeding, enlarged lymph nodes, angioedema, and breast masses.   Past Medical History:  Diagnosis Date  . Angioedema   . Anxiety   . Anxiety and depression   . Chronic constipation   . Chronic kidney disease    stage 3  . COPD (chronic obstructive pulmonary disease) ([place])   . Gallstones   . GERD (gastroesophageal reflux disease)   . Hypertension   . Left ventricular hypertrophy   . Osteoarthritis   . Prurigo nodularis   . Tobacco abuse   . Vitamin D deficiency disease    Past Surgical History:  Procedure Laterality Date  . CHOLECYSTECTOMY    . POLYPECTOMY  11/2011   vocal cord   Social History:  reports that she has been smoking Cigarettes.  She has a 52.00 pack-year smoking history. She has never used smokeless tobacco. She reports that she does not drink alcohol or use drugs.  Allergies  Allergen Reactions  . Enalapril Maleate Swelling  . Other     Bee stings    Family History  Problem Relation Age of Onset  . Alcohol abuse Mother   . Sickle cell anemia Daughter    . Hypertension Son   . Cancer Neg Hx   . COPD Neg Hx   . Diabetes Neg Hx   . Heart disease Neg Hx   . Stroke Neg Hx     Prior to Admission medications   Medication Sig Start Date End Date Taking? Authorizing Provider  acetaminophen (TYLENOL) 650 MG CR tablet Take 650-1,300 mg by mouth every 8 (eight) hours as needed for pain.    Yes Historical Provider, MD  albuterol (PROVENTIL HFA) 108 (90 BASE) MCG/ACT inhaler Inhale 2 puffs into the lungs every 4 (four) hours as needed for wheezing or shortness of breath. 02/02/15  Yes Carrie Mew, MD  aspirin EC 81 MG tablet Take 81 mg by mouth daily.   Yes Historical Provider, MD  cetirizine (ZYRTEC) 10 MG tablet Take 0.5 tablets (5 mg total) by mouth at bedtime. 11/10/15  Yes [name], NP  cholecalciferol (VITAMIN D) 1000 units tablet Take 1,000 Units by mouth daily.   Yes Historical Provider, MD  FLUoxetine (PROZAC) 10 MG capsule Take 10 mg by mouth daily.   Yes Historical Provider, MD  hydrALAZINE (APRESOLINE) 25 MG tablet Take 1 tablet (25 mg total) by mouth 2 (two) times daily. Patient taking differently: Take 25 mg by mouth daily.  11/10/15  Yes [name], NP  metoprolol tartrate (LOPRESSOR) 25 MG tablet Take 0.5 tablets (12.5 mg total) by mouth 2 (two) times daily. Patient taking differently: Take 12.5 mg by mouth daily.  09/18/14  Yes Aldean Jewett,  MD  omeprazole (PRILOSEC) 40 MG capsule Take 40 mg by mouth daily.   Yes Historical Provider, MD  SPIRIVA HANDIHALER 18 MCG inhalation capsule PLACE 1 CAPSULE INTO INHALER AND INHALE ONCE DAILY 09/14/15  Yes [name], NP   Physical Exam: Vitals:   11/27/15 1333 11/27/15 1334 11/27/15 1508 11/27/15 1511  BP: (!) 136/98     Pulse: 90 90 (!) 109 94  Resp: 20 (!) 22    Temp:      TempSrc:      SpO2: 98% 100% (!) 82% 93%  Weight:      Height:         General:  In moderate distress, [place]/AT, WDWN(obese)  Eyes: PERRL, EOMI, no scleral icterus, conjunctiva clear  ENT: moist  oropharynx without exudate, TM's benign, dentition fair  Neck: supple, no lymphadenopathy. No bruits or thyromegaly  Cardiovascular: regular rate without MRG; 2+ peripheral pulses, no JVD, trace peripheral edema  Respiratory: diffuse wheezing without rhonchi or rales. Respiratory effort increased  Abdomen: soft, non tender to palpation, positive bowel sounds, no guarding, no rebound  Skin: no rashes or lesions  Musculoskeletal: normal bulk and tone, no joint swelling  Psychiatric: normal mood and affect, A&OX3  Neurologic: CN 2-12 grossly intact, Motor strength 5/5 in all 4 groups with symmetric DTR's and non-focal sensory exam  Labs on Admission:  Basic Metabolic Panel:  Recent Labs Lab 11/27/15 1244  NA 140  K 4.1  CL 106  CO2 26  GLUCOSE 124*  BUN 17  CREATININE 1.57*  CALCIUM 9.3   Liver Function Tests: No results for input(s): AST, ALT, ALKPHOS, BILITOT, PROT, ALBUMIN in the last 168 hours. No results for input(s): LIPASE, AMYLASE in the last 168 hours. No results for input(s): AMMONIA in the last 168 hours. CBC:  Recent Labs Lab 11/27/15 1244  WBC 4.8  HGB 13.8  HCT 40.8  MCV 93.0  PLT 213   Cardiac Enzymes:  Recent Labs Lab 11/27/15 1244  TROPONINI <0.03    BNP (last 3 results)  Recent Labs  11/27/15 1244  BNP 64.0    ProBNP (last 3 results) No results for input(s): PROBNP in the last 8760 hours.  CBG: No results for input(s): GLUCAP in the last 168 hours.  Radiological Exams on Admission: Dg Chest 2 View  Result Date: 11/27/2015 CLINICAL DATA:  Cough with shortness of breath. EXAM: CHEST  2 VIEW COMPARISON:  Chest x-ray dated 06/21/2015. FINDINGS: Heart size is normal. Overall cardiomediastinal silhouette is stable. Atherosclerotic changes again noted at the aortic arch. Mildly prominent interstitial markings are unchanged, presumably chronic. Lungs otherwise clear. No evidence of pneumonia. No pleural effusion or pneumothorax seen.  Mild degenerative spurring noted within the thoracic spine. No acute or suspicious osseous finding. IMPRESSION: No active cardiopulmonary disease.  No evidence of pneumonia. Aortic atherosclerosis. Electronically Signed   By: Franki Cabot M.D.   On: 11/27/2015 14:21    EKG: Independently reviewed.  Assessment/Plan Principal Problem:   Acute respiratory failure (H[facility]) Active Problems:   Morbid obesity ([place])   COPD exacerbation ([place])   Acute bronchitis   Will admit to floor with O2, IV steroids, IV ABX, and SVN's. Optimize pulmonary regimen. Repeat labs and CXR in AM. Wean O2 as tolerated.  Diet: low salt Fluids: NS@75  DVT Prophylaxis: SQ Heparin  Code Status: FULL  Family Communication: yes  Disposition Plan: home  Time spent: 50 min

## 2015-11-27 NOTE — Progress Notes (Signed)
Pt was alert and commented she was doing better.  Pt's husband had left to go home and make phone calls and will return in the morning.  Pt expressed her relationship with God.  CH offered prayer. CH is available for follow-up.   11/27/15 1800  Clinical Encounter Type  Visited With Patient  Visit Type Initial;Spiritual support  Referral From Nurse  Spiritual Encounters  Spiritual Needs Prayer;Emotional  Stress Factors  Patient Stress Factors Exhausted

## 2015-11-27 NOTE — ED Triage Notes (Signed)
Pt reports worsening SOB and Chest pain over the past couple of days. Family reports fever and cough with yellow sputum.

## 2015-11-28 ENCOUNTER — Inpatient Hospital Stay: Payer: Commercial Managed Care - HMO

## 2015-11-28 LAB — COMPREHENSIVE METABOLIC PANEL
ALBUMIN: 3.3 g/dL — AB (ref 3.5–5.0)
ALK PHOS: 73 U/L (ref 38–126)
ALT: 11 U/L — AB (ref 14–54)
AST: 19 U/L (ref 15–41)
Anion gap: 9 (ref 5–15)
BILIRUBIN TOTAL: 0.4 mg/dL (ref 0.3–1.2)
BUN: 19 mg/dL (ref 6–20)
CALCIUM: 8.9 mg/dL (ref 8.9–10.3)
CO2: 23 mmol/L (ref 22–32)
CREATININE: 1.54 mg/dL — AB (ref 0.44–1.00)
Chloride: 108 mmol/L (ref 101–111)
GFR calc Af Amer: 37 mL/min — ABNORMAL LOW (ref >60)
GFR, EST NON AFRICAN AMERICAN: 32 mL/min — AB (ref >60)
GLUCOSE: 162 mg/dL — AB (ref 65–99)
Potassium: 4.5 mmol/L (ref 3.5–5.1)
Sodium: 140 mmol/L (ref 135–145)
TOTAL PROTEIN: 6.9 g/dL (ref 6.5–8.1)

## 2015-11-28 LAB — CBC
HEMATOCRIT: 39.6 % (ref 35.0–47.0)
Hemoglobin: 12.9 g/dL (ref 12.0–16.0)
MCH: 31 pg (ref 26.0–34.0)
MCHC: 32.7 g/dL (ref 32.0–36.0)
MCV: 94.8 fL (ref 80.0–100.0)
PLATELETS: 197 10*3/uL (ref 150–440)
RBC: 4.17 MIL/uL (ref 3.80–5.20)
RDW: 16 % — AB (ref 11.5–14.5)
WBC: 4 10*3/uL (ref 3.6–11.0)

## 2015-11-28 LAB — GLUCOSE, CAPILLARY: GLUCOSE-CAPILLARY: 178 mg/dL — AB (ref 65–99)

## 2015-11-28 MED ORDER — GUAIFENESIN-DM 100-10 MG/5ML PO SYRP
5.0000 mL | ORAL_SOLUTION | ORAL | Status: DC | PRN
  Administered 2015-11-28 – 2015-11-29 (×3): 5 mL via ORAL
  Filled 2015-11-28 (×3): qty 5

## 2015-11-28 MED ORDER — SODIUM CHLORIDE 0.9% FLUSH
3.0000 mL | Freq: Two times a day (BID) | INTRAVENOUS | Status: DC
  Administered 2015-11-28 – 2015-11-29 (×3): 3 mL via INTRAVENOUS

## 2015-11-28 MED ORDER — SODIUM CHLORIDE 0.9% FLUSH
3.0000 mL | INTRAVENOUS | Status: DC | PRN
  Administered 2015-11-28: 3 mL via INTRAVENOUS
  Filled 2015-11-28: qty 3

## 2015-11-28 MED ORDER — METHYLPREDNISOLONE SODIUM SUCC 125 MG IJ SOLR
60.0000 mg | Freq: Every day | INTRAMUSCULAR | Status: DC
  Administered 2015-11-29: 60 mg via INTRAVENOUS
  Filled 2015-11-28: qty 2

## 2015-11-28 NOTE — Progress Notes (Signed)
Pt reports feeling better; slept naps this morning. Improved breath sounds with wheezes more loose. Eating well. Up to BR with assistance with DOE without acute distress. IVF's discontinued. Solumedrol IV taper ordered.

## 2015-11-28 NOTE — Progress Notes (Addendum)
[place] [facility] [place] NAME: Katherine [name]    MR#:  [id number]  DATE OF BIRTH:  1941/01/15  SUBJECTIVE:   Cough with sob REVIEW OF SYSTEMS:   Review of Systems  Constitutional: Negative for chills, fever and weight loss.  HENT: Negative for ear discharge, ear pain and nosebleeds.   Eyes: Negative for blurred vision, pain and discharge.  Respiratory: Positive for cough and shortness of breath. Negative for sputum production, wheezing and stridor.   Cardiovascular: Negative for chest pain, palpitations, orthopnea and PND.  Gastrointestinal: Negative for abdominal pain, diarrhea, nausea and vomiting.  Genitourinary: Negative for frequency and urgency.  Musculoskeletal: Negative for back pain and joint pain.  Neurological: Positive for weakness. Negative for sensory change, speech change and focal weakness.  Psychiatric/Behavioral: Negative for depression and hallucinations. The patient is not nervous/anxious.    Tolerating Diet:yes Tolerating PT: not needed  DRUG ALLERGIES:   Allergies  Allergen Reactions  . Enalapril Maleate Swelling  . Other     Bee stings    VITALS:  Blood pressure (!) 150/75, pulse 83, temperature 98 F (36.7 C), temperature source Oral, resp. rate 20, height 5\' 6"  (1.676 m), weight 113.5 kg (250 lb 4.8 oz), SpO2 96 %.  PHYSICAL EXAMINATION:   Physical Exam  GENERAL:  75 y.o.-year-old patient lying in the bed with no acute distress.  EYES: Pupils equal, round, reactive to light and accommodation. No scleral icterus. Extraocular muscles intact.  HEENT: Head atraumatic, normocephalic. Oropharynx and nasopharynx clear.  NECK:  Supple, no jugular venous distention. No thyroid enlargement, no tenderness.  LUNGS: Normal breath sounds bilaterally, no wheezing, rales, rhonchi. No use of accessory muscles of respiration.  CARDIOVASCULAR: S1, S2 normal. No murmurs, rubs, or gallops.  ABDOMEN: Soft, nontender,  nondistended. Bowel sounds present. No organomegaly or mass.  EXTREMITIES: No cyanosis, clubbing or edema b/l.    NEUROLOGIC: Cranial nerves II through XII are intact. No focal Motor or sensory deficits b/l.   PSYCHIATRIC:  patient is alert and oriented x 3.  SKIN: No obvious rash, lesion, or ulcer.   LABORATORY PANEL:  CBC  Recent Labs Lab 11/28/15 0410  WBC 4.0  HGB 12.9  HCT 39.6  PLT 197    Chemistries   Recent Labs Lab 11/28/15 0410  NA 140  K 4.5  CL 108  CO2 23  GLUCOSE 162*  BUN 19  CREATININE 1.54*  CALCIUM 8.9  AST 19  ALT 11*  ALKPHOS 73  BILITOT 0.4   Cardiac Enzymes  Recent Labs Lab 11/27/15 1244  TROPONINI <0.03   RADIOLOGY:  Dg Chest 2 View  Result Date: 11/28/2015 CLINICAL DATA:  Shortness of breath and productive cough. EXAM: CHEST  2 VIEW COMPARISON:  11/27/2015 FINDINGS: The cardio pericardial silhouette is enlarged. The lungs are clear wiithout focal pneumonia, edema, pneumothorax or pleural effusion. Interstitial markings are diffusely coarsened with chronic features. The visualized bony structures of the thorax are intact. IMPRESSION: Stable.  Cardiomegaly with chronic interstitial changes. Electronically Signed   By: Misty Stanley M.D.   On: 11/28/2015 07:58   Dg Chest 2 View  Result Date: 11/27/2015 CLINICAL DATA:  Cough with shortness of breath. EXAM: CHEST  2 VIEW COMPARISON:  Chest x-ray dated 06/21/2015. FINDINGS: Heart size is normal. Overall cardiomediastinal silhouette is stable. Atherosclerotic changes again noted at the aortic arch. Mildly prominent interstitial markings are unchanged, presumably chronic. Lungs otherwise clear. No evidence of pneumonia. No pleural effusion or  pneumothorax seen. Mild degenerative spurring noted within the thoracic spine. No acute or suspicious osseous finding. IMPRESSION: No active cardiopulmonary disease.  No evidence of pneumonia. Aortic atherosclerosis. Electronically Signed   By: Franki Cabot  M.D.   On: 11/27/2015 14:21   ASSESSMENT AND PLAN:  Katherine [name] is a 75 y.o. female has a past medical history significant for obesity, OSA, COPD, and HTN now with 2-3 day hx of worsening SOB and cough. No fever. Denies CP  1. Acute COPD exacerbation with acute bronchitis -IV solumedrol, nebs, oxygen -PO abxs -cont oxygen   2. HTN On home meds  3.GERD on PPI  4.Chronic anxiety and depression -stable  Case discussed with Care Management/Social Worker. Management plans discussed with the patient, family and they are in agreement.  CODE STATUS: full  DVT Prophylaxis: lovenox TOTAL TIME TAKING CARE OF THIS PATIENT: 30 minutes.  >50% time spent on counselling and coordination of care  POSSIBLE D/C IN 1-2 DAYS, DEPENDING ON CLINICAL CONDITION.  Note: This dictation was prepared with Dragon dictation along with smaller phrase technology. Any transcriptional errors that result from this process are unintentional.  Evadene Wardrip M.D on 11/28/2015 at 11:13 AM  Between 7am to 6pm - Pager - [phone]  After 6pm go to [email or web address] - password [email or web address]  Office  [phone]  CC: Primary care physician; [name], NP

## 2015-11-28 NOTE — Care Management Important Message (Signed)
Important Message  Patient Details  Name: Katherine [name] MRN: [id number] Date of Birth: [birth year]   Medicare Important Message Given:  Yes    Latora Quarry A, RN 11/28/2015, 2:45 PM

## 2015-11-29 LAB — PROCALCITONIN

## 2015-11-29 LAB — GLUCOSE, CAPILLARY: Glucose-Capillary: 105 mg/dL — ABNORMAL HIGH (ref 65–99)

## 2015-11-29 MED ORDER — MONTELUKAST SODIUM 10 MG PO TABS: 10.0000 mg | ORAL_TABLET | Freq: Every day | ORAL | 1 refills | Status: DC

## 2015-11-29 MED ORDER — CEFUROXIME AXETIL 500 MG PO TABS
500.0000 mg | ORAL_TABLET | Freq: Two times a day (BID) | ORAL | Status: DC
  Filled 2015-11-29: qty 1

## 2015-11-29 MED ORDER — CEFUROXIME AXETIL 500 MG PO TABS: 500.0000 mg | ORAL_TABLET | Freq: Two times a day (BID) | ORAL | 0 refills | Status: DC

## 2015-11-29 MED ORDER — PREDNISONE 10 MG PO TABS: ORAL_TABLET | ORAL | 0 refills | Status: DC

## 2015-11-29 MED ORDER — MOMETASONE FURO-FORMOTEROL FUM 100-5 MCG/ACT IN AERO: 2.0000 | INHALATION_SPRAY | Freq: Two times a day (BID) | RESPIRATORY_TRACT | 0 refills | Status: DC

## 2015-11-29 MED ORDER — IPRATROPIUM-ALBUTEROL 0.5-2.5 (3) MG/3ML IN SOLN
3.0000 mL | Freq: Four times a day (QID) | RESPIRATORY_TRACT | 1 refills | Status: DC | PRN
Start: 2015-11-29 — End: 2017-07-16

## 2015-11-29 MED ORDER — PREDNISONE 50 MG PO TABS: 50.0000 mg | ORAL_TABLET | Freq: Every day | ORAL | Status: DC

## 2015-11-29 NOTE — Plan of Care (Signed)
Problem: Safety: Goal: Ability to remain free from injury will improve Outcome: Progressing Moderate fall risk with bed alarm on for safety. Pt does call for assistance but has unsteady gait at times.

## 2015-11-29 NOTE — Progress Notes (Signed)
SATURATION QUALIFICATIONS: (This note is used to comply with regulatory documentation for home oxygen)  Patient Saturations on Room Air at Rest =87%  Patient Saturations on Room Air while Ambulating = 87  Patient Saturations on 2 Liters of oxygen while Ambulating = 93%  Please briefly explain why patient needs home oxygen:

## 2015-11-29 NOTE — Progress Notes (Signed)
Chaplain was making his rounds and visited with pt in room 103. Provided prayer and emotional support.    11/29/15 1135  Clinical Encounter Type  Visited With Patient  Visit Type Initial;Spiritual support  Referral From Nurse  Spiritual Encounters  Spiritual Needs Prayer

## 2015-11-29 NOTE — Care Management (Signed)
Admitted to [facility] with the diagnosis of acute respiratory. Lives with husband, [name] x 14 years. [phone]). Last seen [name] 11/10/15. [facility] in the past for [place]. No skilled facility. Home oxygen for the last 3 months at 2liters per nasal cannula. Prescriptions are filled at [facility] in [place]. Uses a rolling walker and cane to aid in ambulation. No falls. Fair-good appetite. Takes care of all basic activities of daily living with the help of her husband for bathing and dressing at times. Doesn't drive. Husband will transport. Shelbie Ammons RN MSN CCM Care Management [phone]

## 2015-11-29 NOTE — Progress Notes (Signed)
Received MD order to discharge patient to home with home health, reviewed prescriptions home meds, follow up appointments and discharge instructions with patient and husband and both verbalized understanding.  Discharged with nebulizer  machine in wheelchair to home

## 2015-11-29 NOTE — Discharge Instructions (Signed)
Use oxygen as directed

## 2015-11-29 NOTE — Care Management (Signed)
Husband states wife's oxygen is through [place]. Will fax oxygen request to [facility]. Nebulizer provided by [facility]. Shelbie Ammons RN MSN CCM Care Management [phone]

## 2015-11-29 NOTE — Discharge Summary (Signed)
Katherine [name]    MR#:  [id number]  DATE OF BIRTH:  12/10/1940  DATE OF ADMISSION:  11/27/2015 ADMITTING PHYSICIAN: Idelle Crouch, MD  DATE OF DISCHARGE: 11/29/15  PRIMARY CARE PHYSICIAN: [name], NP    ADMISSION DIAGNOSIS:  SOB (shortness of breath) [R06.02] COPD exacerbation (HCC) [J44.1]  DISCHARGE DIAGNOSIS:  Acute on chronic Respiratory failure due to COPD flare Hypoxia Acute bronchitis SECONDARY DIAGNOSIS:   Past Medical History:  Diagnosis Date  . Angioedema   . Anxiety   . Anxiety and depression   . Chronic constipation   . Chronic kidney disease    stage 3  . COPD (chronic obstructive pulmonary disease) ([place])   . Gallstones   . GERD (gastroesophageal reflux disease)   . Hypertension   . Left ventricular hypertrophy   . Osteoarthritis   . Prurigo nodularis   . Tobacco abuse   . Vitamin D deficiency disease     HOSPITAL COURSE:  Katherine [name] a 75 y.o.femalehas a past medical history significant for obesity, OSA, COPD, and HTN now with 2-3 day hx of worsening SOB and cough. No fever. Denies CP  1. Acute COPD exacerbation with acute bronchitis -IV solumedrol, nebs, oxygen---change to po steroid taper and po ceftin -PO abxs -cont oxygen  -pt will need oxygen during the daytime also. Home nebulizer  2. HTN On home meds  3.GERD on PPI  4.Chronic anxiety and depression -stable  Overall improving. D/c home CONSULTS OBTAINED:    DRUG ALLERGIES:   Allergies  Allergen Reactions  . Enalapril Maleate Swelling  . Other     Bee stings    DISCHARGE MEDICATIONS:   Current Discharge Medication List    START taking these medications   Details  cefUROXime (CEFTIN) 500 MG tablet Take 1 tablet (500 mg total) by mouth 2 (two) times daily with a meal. Qty: 10 tablet, Refills: 0    ipratropium-albuterol (DUONEB) 0.5-2.5 (3) MG/3ML SOLN Take 3 mLs by  nebulization every 6 (six) hours as needed. Qty: 360 mL, Refills: 1    mometasone-formoterol (DULERA) 100-5 MCG/ACT AERO Inhale 2 puffs into the lungs 2 (two) times daily. Qty: 1 Inhaler, Refills: 0    montelukast (SINGULAIR) 10 MG tablet Take 1 tablet (10 mg total) by mouth at bedtime. Qty: 30 tablet, Refills: 1    predniSONE (DELTASONE) 10 MG tablet Take 50 mg daily then taper by 10 mg and stop Qty: 15 tablet, Refills: 0      CONTINUE these medications which have NOT CHANGED   Details  acetaminophen (TYLENOL) 650 MG CR tablet Take 650-1,300 mg by mouth every 8 (eight) hours as needed for pain.     albuterol (PROVENTIL HFA) 108 (90 BASE) MCG/ACT inhaler Inhale 2 puffs into the lungs every 4 (four) hours as needed for wheezing or shortness of breath. Qty: 1 Inhaler, Refills: 0    aspirin EC 81 MG tablet Take 81 mg by mouth daily.    cetirizine (ZYRTEC) 10 MG tablet Take 0.5 tablets (5 mg total) by mouth at bedtime. Qty: 15 tablet, Refills: 12    cholecalciferol (VITAMIN D) 1000 units tablet Take 1,000 Units by mouth daily.    FLUoxetine (PROZAC) 10 MG capsule Take 10 mg by mouth daily.    hydrALAZINE (APRESOLINE) 25 MG tablet Take 1 tablet (25 mg total) by mouth 2 (two) times daily. Qty: 60 tablet, Refills: 5    metoprolol tartrate (LOPRESSOR)  25 MG tablet Take 0.5 tablets (12.5 mg total) by mouth 2 (two) times daily. Qty: 60 tablet, Refills: 0    omeprazole (PRILOSEC) 40 MG capsule Take 40 mg by mouth daily.    SPIRIVA HANDIHALER 18 MCG inhalation capsule PLACE 1 CAPSULE INTO INHALER AND INHALE ONCE DAILY Qty: 30 capsule, Refills: 12        If you experience worsening of your admission symptoms, develop shortness of breath, life threatening emergency, suicidal or homicidal thoughts you must seek medical attention immediately by calling 911 or calling your MD immediately  if symptoms less severe.  You Must read complete instructions/literature along with all the possible  adverse reactions/side effects for all the Medicines you take and that have been prescribed to you. Take any new Medicines after you have completely understood and accept all the possible adverse reactions/side effects.   Please note  You were cared for by a hospitalist during your hospital stay. If you have any questions about your discharge medications or the care you received while you were in the hospital after you are discharged, you can call the unit and asked to speak with the hospitalist on call if the hospitalist that took care of you is not available. Once you are discharged, your primary care physician will handle any further medical issues. Please note that NO REFILLS for any discharge medications will be authorized once you are discharged, as it is imperative that you return to your primary care physician (or establish a relationship with a primary care physician if you do not have one) for your aftercare needs so that they can reassess your need for medications and monitor your lab values. Today   SUBJECTIVE   Feels better. Dry cough  VITAL SIGNS:  Blood pressure 135/74, pulse 84, temperature 98.1 F (36.7 C), temperature source Oral, resp. rate (!) 22, height 5\' 6"  (1.676 m), weight 114.4 kg (252 lb 1.6 oz), SpO2 94 %.  I/O:   Intake/Output Summary (Last 24 hours) at 11/29/15 1110 Last data filed at 11/29/15 0900  Gross per 24 hour  Intake              530 ml  Output                0 ml  Net              530 ml    PHYSICAL EXAMINATION:  GENERAL:  75 y.o.-year-old patient lying in the bed with no acute distress. obese EYES: Pupils equal, round, reactive to light and accommodation. No scleral icterus. Extraocular muscles intact.  HEENT: Head atraumatic, normocephalic. Oropharynx and nasopharynx clear.  NECK:  Supple, no jugular venous distention. No thyroid enlargement, no tenderness.  LUNGS: coarse breath sounds bilaterally, no wheezing, rales,rhonchi or crepitation. No  use of accessory muscles of respiration.  CARDIOVASCULAR: S1, S2 normal. No murmurs, rubs, or gallops.  ABDOMEN: Soft, non-tender, non-distended. Bowel sounds present. No organomegaly or mass.  EXTREMITIES: No pedal edema, cyanosis, or clubbing.  NEUROLOGIC: Cranial nerves II through XII are intact. Muscle strength 5/5 in all extremities. Sensation intact. Gait not checked.  PSYCHIATRIC: The patient is alert and oriented x 3.  SKIN: No obvious rash, lesion, or ulcer.   DATA REVIEW:   CBC   Recent Labs Lab 11/28/15 0410  WBC 4.0  HGB 12.9  HCT 39.6  PLT 197    Chemistries   Recent Labs Lab 11/28/15 0410  NA 140  K 4.5  CL 108  CO2 23  GLUCOSE 162*  BUN 19  CREATININE 1.54*  CALCIUM 8.9  AST 19  ALT 11*  ALKPHOS 73  BILITOT 0.4    Microbiology Results   No results found for this or any previous visit (from the past 240 hour(s)).  RADIOLOGY:  Dg Chest 2 View  Result Date: 11/28/2015 CLINICAL DATA:  Shortness of breath and productive cough. EXAM: CHEST  2 VIEW COMPARISON:  11/27/2015 FINDINGS: The cardio pericardial silhouette is enlarged. The lungs are clear wiithout focal pneumonia, edema, pneumothorax or pleural effusion. Interstitial markings are diffusely coarsened with chronic features. The visualized bony structures of the thorax are intact. IMPRESSION: Stable.  Cardiomegaly with chronic interstitial changes. Electronically Signed   By: Misty Stanley M.D.   On: 11/28/2015 07:58   Dg Chest 2 View  Result Date: 11/27/2015 CLINICAL DATA:  Cough with shortness of breath. EXAM: CHEST  2 VIEW COMPARISON:  Chest x-ray dated 06/21/2015. FINDINGS: Heart size is normal. Overall cardiomediastinal silhouette is stable. Atherosclerotic changes again noted at the aortic arch. Mildly prominent interstitial markings are unchanged, presumably chronic. Lungs otherwise clear. No evidence of pneumonia. No pleural effusion or pneumothorax seen. Mild degenerative spurring noted  within the thoracic spine. No acute or suspicious osseous finding. IMPRESSION: No active cardiopulmonary disease.  No evidence of pneumonia. Aortic atherosclerosis. Electronically Signed   By: Franki Cabot M.D.   On: 11/27/2015 14:21     Management plans discussed with the patient, family and they are in agreement.  CODE STATUS:     Code Status Orders        Start     Ordered   11/27/15 1646  Full code  Continuous     11/27/15 1645    Code Status History    Date Active Date Inactive Code Status Order ID Comments User Context   02/10/2015  9:22 PM 02/12/2015  6:09 PM Full Code [id number]  Lytle Butte, MD ED   09/16/2014 12:20 PM 09/19/2014  5:36 PM Full Code [id number]  Aldean Jewett, MD Inpatient      TOTAL TIME TAKING CARE OF THIS PATIENT: 40 minutes.    Rieley Khalsa M.D on 11/29/2015 at 11:10 AM  Between 7am to 6pm - Pager - [phone] After 6pm go to [email or web address] - password [email or web address] [facility]  Office  [phone]  CC: Primary care physician; [name], NP

## 2015-11-30 LAB — BLOOD GAS, VENOUS
ACID-BASE EXCESS: 1.4 mmol/L (ref 0.0–2.0)
BICARBONATE: 28.1 mmol/L — AB (ref 20.0–28.0)
O2 SAT: 57 %
PCO2 VEN: 52 mmHg (ref 44.0–60.0)
PO2 VEN: 32 mmHg (ref 32.0–45.0)
Patient temperature: 37
pH, Ven: 7.34 (ref 7.250–7.430)

## 2015-12-05 DIAGNOSIS — J449 Chronic obstructive pulmonary disease, unspecified: Secondary | ICD-10-CM | POA: Diagnosis not present

## 2015-12-06 DIAGNOSIS — M1711 Unilateral primary osteoarthritis, right knee: Secondary | ICD-10-CM | POA: Diagnosis not present

## 2015-12-08 ENCOUNTER — Ambulatory Visit (INDEPENDENT_AMBULATORY_CARE_PROVIDER_SITE_OTHER): Payer: Commercial Managed Care - HMO | Admitting: Unknown Physician Specialty

## 2015-12-08 ENCOUNTER — Encounter: Payer: Self-pay | Admitting: Unknown Physician Specialty

## 2015-12-08 VITALS — BP 113/73 | HR 85 | Temp 98.6°F | Ht 61.1 in | Wt 249.8 lb

## 2015-12-08 DIAGNOSIS — R0602 Shortness of breath: Secondary | ICD-10-CM

## 2015-12-08 DIAGNOSIS — Z09 Encounter for follow-up examination after completed treatment for conditions other than malignant neoplasm: Secondary | ICD-10-CM | POA: Diagnosis not present

## 2015-12-08 DIAGNOSIS — F17210 Nicotine dependence, cigarettes, uncomplicated: Secondary | ICD-10-CM

## 2015-12-08 DIAGNOSIS — J432 Centrilobular emphysema: Secondary | ICD-10-CM

## 2015-12-08 DIAGNOSIS — J449 Chronic obstructive pulmonary disease, unspecified: Secondary | ICD-10-CM | POA: Diagnosis not present

## 2015-12-08 NOTE — Assessment & Plan Note (Signed)
Encouraged complete cessation and recommend she get her husband to quit with her.

## 2015-12-08 NOTE — Progress Notes (Signed)
BP 113/73 (BP Location: Left Arm, Patient Position: Sitting, Cuff Size: Large)   Pulse 85   Temp 98.6 F (37 C)   Ht 5' 1.1" (1.552 m)   Wt 249 lb 12.8 oz (113.3 kg)   SpO2 93%   BMI 47.04 kg/m    Subjective:    Patient ID: Katherine [name], female    DOB: 1940/09/05, 75 y.o.   MRN: [id number]  HPI: Katherine [name] is a 75 y.o. female  Chief Complaint  Patient presents with  . Hospitalization Follow-up    COPD exacberation   Hospitalized with increase in SOB and sputum production. Was treated with antibiotics and steroids. At this point she is not back to her baseline.  Had dulera inhaler with her which she is only using once a day. Smoking half to a whole cigarette a day.     Relevant past medical, surgical, family and social history reviewed and updated as indicated. Interim medical history since our last visit reviewed. Allergies and medications reviewed and updated.  Review of Systems  Respiratory: Positive for cough, shortness of breath and wheezing.   Cardiovascular: Positive for leg swelling. Negative for chest pain and palpitations.  Gastrointestinal: Positive for diarrhea.    Per HPI unless specifically indicated above     Objective:    BP 113/73 (BP Location: Left Arm, Patient Position: Sitting, Cuff Size: Large)   Pulse 85   Temp 98.6 F (37 C)   Ht 5' 1.1" (1.552 m)   Wt 249 lb 12.8 oz (113.3 kg)   SpO2 93%   BMI 47.04 kg/m   Wt Readings from Last 3 Encounters:  12/08/15 249 lb 12.8 oz (113.3 kg)  11/29/15 252 lb 1.6 oz (114.4 kg)  11/10/15 248 lb 9.6 oz (112.8 kg)    Physical Exam  Constitutional: She is oriented to person, place, and time. She appears well-developed and well-nourished.  Cardiovascular: Normal rate, regular rhythm and normal heart sounds.   Pulmonary/Chest: Accessory muscle usage present. She has decreased breath sounds. She has no wheezes. She has no rhonchi.  Neurological: She is alert and oriented to person, place, and time.   Psychiatric: She has a normal mood and affect. Her behavior is normal. Judgment and thought content normal.    Results for orders placed or performed during the hospital encounter of 11/27/15  Blood gas, venous  Result Value Ref Range   pH, Ven 7.34 7.250 - 7.430   pCO2, Ven 52 44.0 - 60.0 mmHg   pO2, Ven 32.0 32.0 - 45.0 mmHg   Bicarbonate 28.1 (H) 20.0 - 28.0 mmol/L   Acid-Base Excess 1.4 0.0 - 2.0 mmol/L   O2 Saturation 57.0 %   Patient temperature 37.0    Collection site VEIN    Sample type VEIN   Brain natriuretic peptide  Result Value Ref Range   B Natriuretic Peptide 64.0 0.0 - 100.0 pg/mL  CBC  Result Value Ref Range   WBC 4.8 3.6 - 11.0 K/uL   RBC 4.38 3.80 - 5.20 MIL/uL   Hemoglobin 13.8 12.0 - 16.0 g/dL   HCT 40.8 35.0 - 47.0 %   MCV 93.0 80.0 - 100.0 fL   MCH 31.4 26.0 - 34.0 pg   MCHC 33.7 32.0 - 36.0 g/dL   RDW 16.0 (H) 11.5 - 14.5 %   Platelets 213 150 - 440 K/uL  Basic metabolic panel  Result Value Ref Range   Sodium 140 135 - 145 mmol/L   Potassium 4.1 3.5 -  5.1 mmol/L   Chloride 106 101 - 111 mmol/L   CO2 26 22 - 32 mmol/L   Glucose, Bld 124 (H) 65 - 99 mg/dL   BUN 17 6 - 20 mg/dL   Creatinine, Ser 1.57 (H) 0.44 - 1.00 mg/dL   Calcium 9.3 8.9 - 10.3 mg/dL   GFR calc non Af Amer 31 (L) >60 mL/min   GFR calc Af Amer 36 (L) >60 mL/min   Anion gap 8 5 - 15  Troponin I  Result Value Ref Range   Troponin I <0.03 <0.03 ng/mL  Comprehensive metabolic panel  Result Value Ref Range   Sodium 140 135 - 145 mmol/L   Potassium 4.5 3.5 - 5.1 mmol/L   Chloride 108 101 - 111 mmol/L   CO2 23 22 - 32 mmol/L   Glucose, Bld 162 (H) 65 - 99 mg/dL   BUN 19 6 - 20 mg/dL   Creatinine, Ser 1.54 (H) 0.44 - 1.00 mg/dL   Calcium 8.9 8.9 - 10.3 mg/dL   Total Protein 6.9 6.5 - 8.1 g/dL   Albumin 3.3 (L) 3.5 - 5.0 g/dL   AST 19 15 - 41 U/L   ALT 11 (L) 14 - 54 U/L   Alkaline Phosphatase 73 38 - 126 U/L   Total Bilirubin 0.4 0.3 - 1.2 mg/dL   GFR calc non Af Amer 32  (L) >60 mL/min   GFR calc Af Amer 37 (L) >60 mL/min   Anion gap 9 5 - 15  CBC  Result Value Ref Range   WBC 4.0 3.6 - 11.0 K/uL   RBC 4.17 3.80 - 5.20 MIL/uL   Hemoglobin 12.9 12.0 - 16.0 g/dL   HCT 39.6 35.0 - 47.0 %   MCV 94.8 80.0 - 100.0 fL   MCH 31.0 26.0 - 34.0 pg   MCHC 32.7 32.0 - 36.0 g/dL   RDW 16.0 (H) 11.5 - 14.5 %   Platelets 197 150 - 440 K/uL  Glucose, capillary  Result Value Ref Range   Glucose-Capillary 178 (H) 65 - 99 mg/dL  Procalcitonin - Baseline  Result Value Ref Range   Procalcitonin <0.10 ng/mL  Glucose, capillary  Result Value Ref Range   Glucose-Capillary 105 (H) 65 - 99 mg/dL      Assessment & Plan:   Problem List Items Addressed This Visit      Unprioritized   Cigarette smoker    Encouraged complete cessation and recommend she get her husband to quit with her.      COPD (chronic obstructive pulmonary disease) (HCC)    Oxygen level stable, 93 at rest, 91 with activity.  Educated on proper use of all inhalers and encouraged her to keep her rescue inhaler with instead of her long acting inhaler.  Referred back to Pulmonology for further management.       Hospital discharge follow-up    Supposed to have oxygen.  She states it was delivered and planning on calling to see how to use it      SOB (shortness of breath) - Primary    Other Visit Diagnoses    COPD, severe ([place])       Relevant Orders   Ambulatory referral to Pulmonology       Follow up plan: Return in about 3 months (around 03/09/2016).

## 2015-12-08 NOTE — Assessment & Plan Note (Signed)
Supposed to have oxygen.  She states it was delivered and planning on calling to see how to use it

## 2015-12-08 NOTE — Assessment & Plan Note (Addendum)
Oxygen level stable, 93 at rest, 91 with activity.  Educated on proper use of all inhalers and encouraged her to keep her rescue inhaler with instead of her long acting inhaler.  Referred back to Pulmonology for further management.

## 2015-12-13 DIAGNOSIS — J449 Chronic obstructive pulmonary disease, unspecified: Secondary | ICD-10-CM | POA: Diagnosis not present

## 2015-12-14 ENCOUNTER — Other Ambulatory Visit: Payer: Self-pay | Admitting: Orthopedic Surgery

## 2015-12-14 DIAGNOSIS — M1711 Unilateral primary osteoarthritis, right knee: Secondary | ICD-10-CM

## 2015-12-20 ENCOUNTER — Ambulatory Visit
Admission: RE | Admit: 2015-12-20 | Discharge: 2015-12-20 | Disposition: A | Discharge status: Home / Self Care | Payer: Commercial Managed Care - HMO | Source: Ambulatory Visit | Attending: Orthopedic Surgery | Admitting: Orthopedic Surgery

## 2015-12-20 DIAGNOSIS — M179 Osteoarthritis of knee, unspecified: Secondary | ICD-10-CM | POA: Diagnosis not present

## 2015-12-20 DIAGNOSIS — M1711 Unilateral primary osteoarthritis, right knee: Secondary | ICD-10-CM | POA: Insufficient documentation

## 2016-01-05 DIAGNOSIS — J449 Chronic obstructive pulmonary disease, unspecified: Secondary | ICD-10-CM | POA: Diagnosis not present

## 2016-01-11 IMAGING — CR RIGHT FOOT - 2 VIEW
1 series · 2 of 2 positions shown · non-contrast
Comparison: None

CLINICAL DATA: RIGHT foot pain and swelling for 3 days, history
arthritis

EXAM:
RIGHT FOOT - 2 VIEW

[Series 1: ap · 0.17mm/px · 2 of 2 slices shown]
[im 1/2]
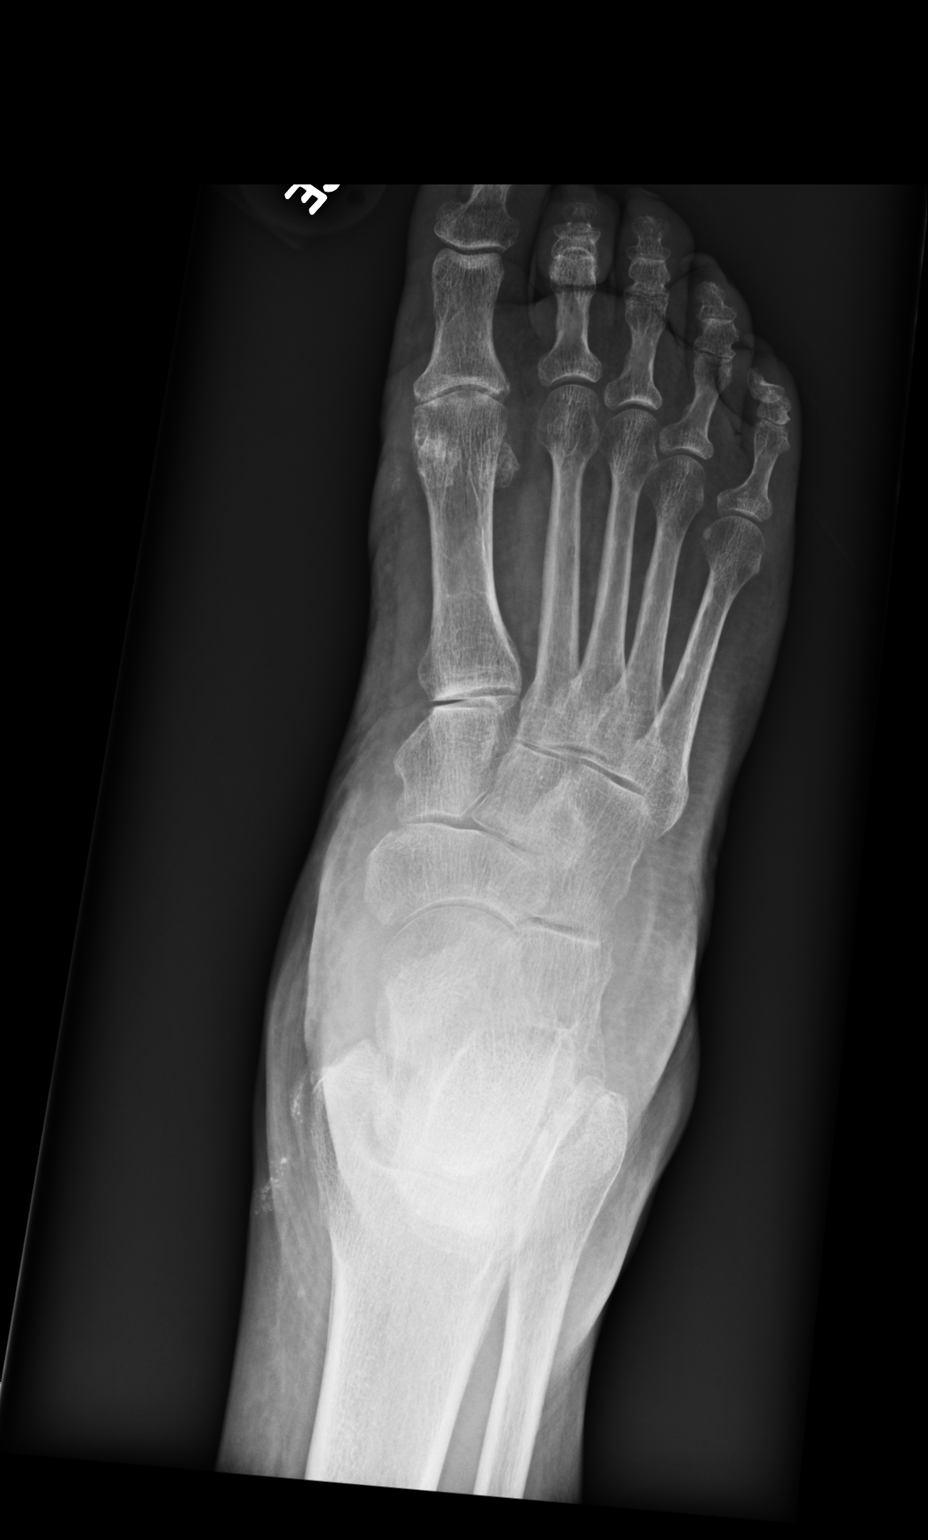
[im 2/2]
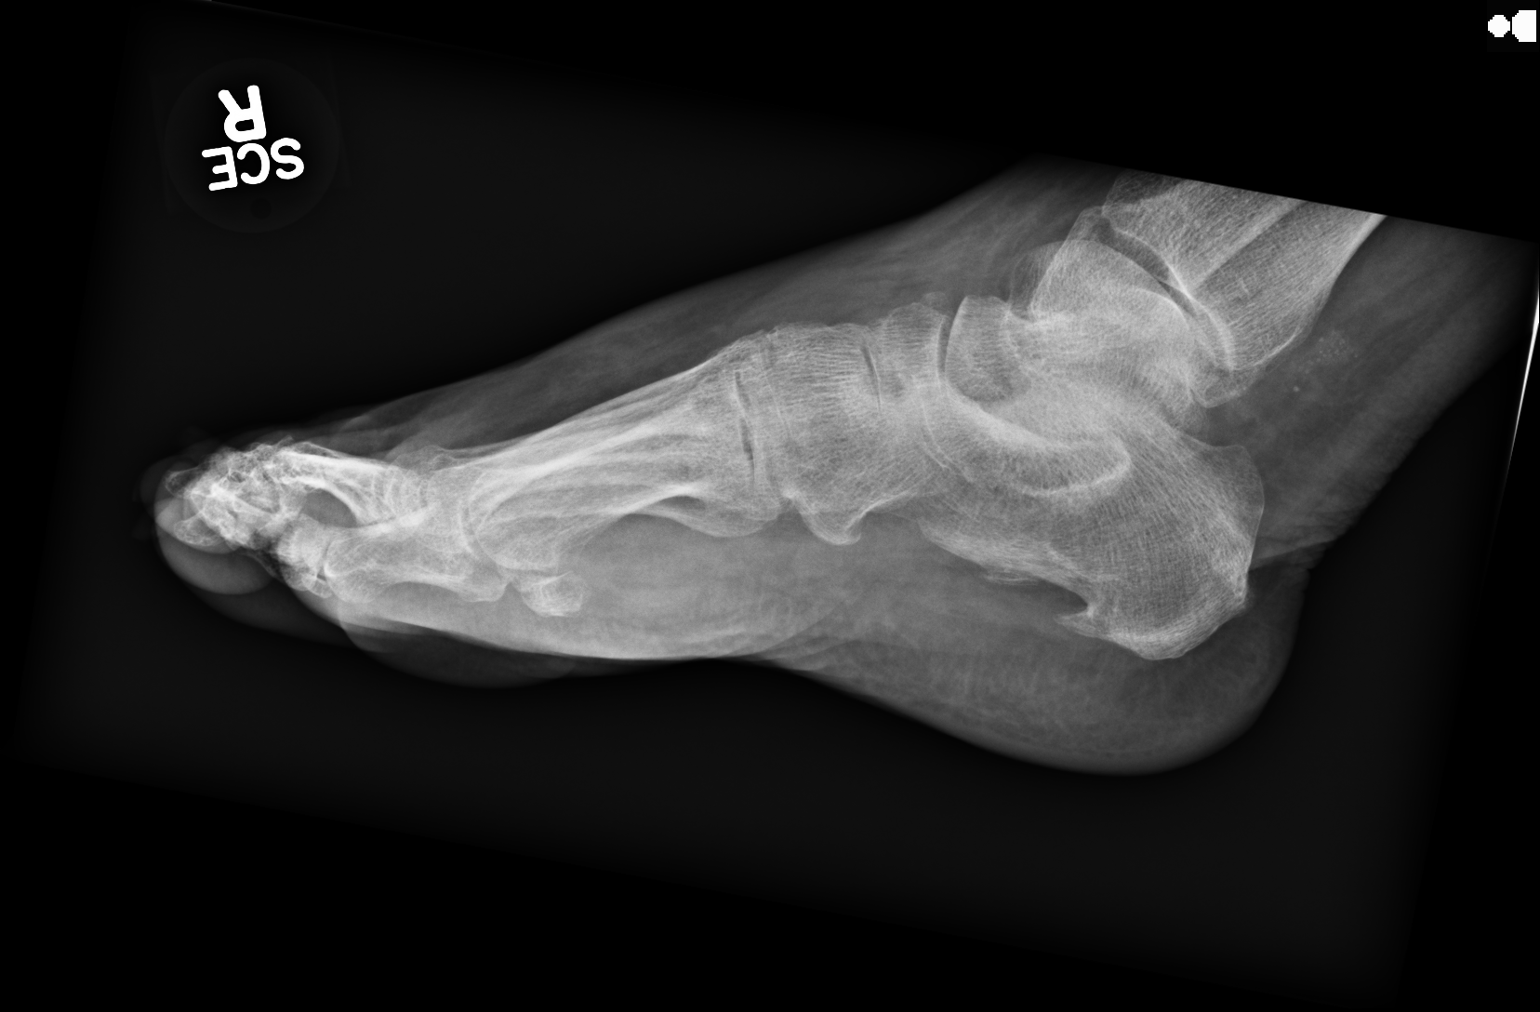

[2 of 2 positions shown; findings below may reference images not displayed]

FINDINGS: Osseous demineralization.

Degenerative changes at first MTP joint.

Remaining joint spaces preserved.

Large plantar calcaneal spur.

Diffuse soft tissue swelling.

No acute fracture, dislocation, or bone destruction.

Nonspecific soft tissue calcifications at posterior-medial RIGHT
ankle.
IMPRESSION: Osseous demineralization with calcaneal spurring and degenerative
changes at first MTP joint.

No acute abnormalities.

## 2016-01-13 DIAGNOSIS — J449 Chronic obstructive pulmonary disease, unspecified: Secondary | ICD-10-CM | POA: Diagnosis not present

## 2016-01-20 ENCOUNTER — Encounter: Payer: Self-pay | Admitting: Pulmonary Disease

## 2016-01-20 ENCOUNTER — Ambulatory Visit (INDEPENDENT_AMBULATORY_CARE_PROVIDER_SITE_OTHER): Payer: Commercial Managed Care - HMO | Admitting: Pulmonary Disease

## 2016-01-20 VITALS — BP 138/86 | HR 91 | Ht 61.0 in | Wt 254.0 lb

## 2016-01-20 DIAGNOSIS — J9611 Chronic respiratory failure with hypoxia: Secondary | ICD-10-CM | POA: Diagnosis not present

## 2016-01-20 DIAGNOSIS — R942 Abnormal results of pulmonary function studies: Secondary | ICD-10-CM

## 2016-01-20 DIAGNOSIS — F17200 Nicotine dependence, unspecified, uncomplicated: Secondary | ICD-10-CM | POA: Diagnosis not present

## 2016-01-20 DIAGNOSIS — E662 Morbid (severe) obesity with alveolar hypoventilation: Secondary | ICD-10-CM | POA: Diagnosis not present

## 2016-01-20 MED ORDER — FLUTICASONE FUROATE-VILANTEROL 100-25 MCG/INH IN AEPB: 1.0000 | INHALATION_SPRAY | Freq: Every day | RESPIRATORY_TRACT | 5 refills | Status: DC

## 2016-01-20 MED ORDER — FLUTICASONE FUROATE-VILANTEROL 100-25 MCG/INH IN AEPB: 1.0000 | INHALATION_SPRAY | Freq: Every day | RESPIRATORY_TRACT | 0 refills | Status: AC

## 2016-01-20 NOTE — Progress Notes (Signed)
PULMONARY CONSULT NOTE  Requesting MD/Service: [name], NP Date of initial consultation: 01/20/16 Reason for consultation:  Chronic hypoxemic respiratory failure, COPD, OHS  PT PROFILE: 75 y.o. F smoker referred for evaluation of chronic hypoxemic respiratory failure COPD, OHS.   HPI:  As above. She is a very difficult historian. She is on chronic oxygen therapy @ 2 lpm by Ruth and indicates that this was started after a hospitalization in Oct of this year. She is profoundly limited by obesity, deconditioning and DOE. She describes class III dyspnea but it is very difficult to sort out what is the major limiting factor in her impairment. She denies CP, fever, purulent sputum, hemoptysis, LE edema and calf tenderness.   Past Medical History:  Diagnosis Date  . Angioedema   . Anxiety   . Anxiety and depression   . Chronic constipation   . Chronic kidney disease    stage 3  . COPD (chronic obstructive pulmonary disease) ([place])   . Gallstones   . GERD (gastroesophageal reflux disease)   . Hypertension   . Left ventricular hypertrophy   . Osteoarthritis   . Prurigo nodularis   . Tobacco abuse   . Vitamin D deficiency disease     Past Surgical History:  Procedure Laterality Date  . CHOLECYSTECTOMY    . POLYPECTOMY  11/2011   vocal cord    MEDICATIONS: I have reviewed all medications and confirmed regimen as documented  Social History   Social History  . Marital status: Married    Spouse name: N/A  . Number of children: N/A  . Years of education: N/A   Occupational History  . Not on file.   Social History Main Topics  . Smoking status: Light Tobacco Smoker    Packs/day: 0.00    Years: 52.00    Types: Cigarettes  . Smokeless tobacco: Never Used     Comment: smokes 1.5 cigarettes daily-12/7  . Alcohol use No     Comment: rare  . Drug use: No  . Sexual activity: Not on file   Other Topics Concern  . Not on file   Social History Narrative  . No narrative on  file    Family History  Problem Relation Age of Onset  . Alcohol abuse Mother   . Sickle cell anemia Daughter   . Hypertension Son   . Cancer Neg Hx   . COPD Neg Hx   . Diabetes Neg Hx   . Heart disease Neg Hx   . Stroke Neg Hx     ROS: No fever, myalgias/arthralgias, unexplained weight loss or weight gain No new focal weakness or sensory deficits No otalgia, hearing loss, visual changes, nasal and sinus symptoms, mouth and throat problems No neck pain or adenopathy No abdominal pain, N/V/D, diarrhea, change in bowel pattern No dysuria, change in urinary pattern   Vitals:   01/20/16 1000  BP: 138/86  Pulse: 91  SpO2: 94%  Weight: 254 lb (115.2 kg)  Height: 5\' 1"  (1.549 m)   2 LPM [place]  EXAM:  Gen: Plethoric, obese, No overt respiratory distress HEENT: NCAT, sclera white, oropharynx normal Neck: Short, thick neck, JVP cannot be assessed, no LAN, thyromegaly Lungs:Audible, sonorous almost snoring respirations, auscultated  breath sounds are distant and diminished with a few scattered wheezes Cardiovascular: distant HS reg rhythm, no murmurs noted Abdomen: Obese, soft, nontender, normal BS Ext: without clubbing, cyanosis. Trace syymetric ankle edema Neuro: CNs grossly intact, motor and sensory intact Skin: Limited exam, no  lesions noted  DATA:   BMP Latest Ref Rng & Units 11/28/2015 11/27/2015 09/07/2015  Glucose 65 - 99 mg/dL 162(H) 124(H) 100(H)  BUN 6 - 20 mg/dL 19 17 19   Creatinine 0.44 - 1.00 mg/dL 1.54(H) 1.57(H) 1.60(H)  BUN/Creat Ratio 12 - 28 - - 12  Sodium 135 - 145 mmol/L 140 140 140  Potassium 3.5 - 5.1 mmol/L 4.5 4.1 4.9  Chloride 101 - 111 mmol/L 108 106 100  CO2 22 - 32 mmol/L 23 26 24   Calcium 8.9 - 10.3 mg/dL 8.9 9.3 9.0    CBC Latest Ref Rng & Units 11/28/2015 11/27/2015 03/08/2015  WBC 3.6 - 11.0 K/uL 4.0 4.8 7.2  Hemoglobin 12.0 - 16.0 g/dL 12.9 13.8 -  Hematocrit 35.0 - 47.0 % 39.6 40.8 40.3  Platelets 150 - 440 K/uL 197 213 260    CXR  (11/28/15): cardiomegaly, NAD    IMPRESSION:     ICD-9-CM ICD-10-CM   1. Chronic hypoxemic respiratory failure (HCC) 518.83 J96.11    799.02    2. Smoker 305.1 F17.200   3. Restrictive pattern present on pulmonary function testing 794.2 R94.2   4. Obesity hypoventilation syndrome ([place]) 278.03 E66.2      PLAN:  1) You must quit smoking completely. Cigarettes are the main cause of your lung problems 2) Your breathing would benefit greatly from weight loss 3) you should wear oxygen as close to 24 hrs per day as possible 4) Continue the Spiriva Handihaler (That's the inhaler that you drop the capsule in to) 5) Add new inhaler called Breo - one inhalation each day 6) use your nebulizer machine as needed for increased shortness of breath or wheezing 7) Follow up in 3 months or as needed   Merton Border, MD PCCM service Mobile [phone] Pager [phone] 01/22/2016

## 2016-01-20 NOTE — Patient Instructions (Addendum)
1) You must quit smoking completely. Cigarettes are the main cause of your lung problems 2) Your breathing would benefit greatly from weight loss 3) you should wear oxygen as close to 24 hrs per day as possible 4) Continue the Spiriva Handihaler (That's the inhaler that you drop the capsule in to) 5) Add new inhaler called Breo - one inhalation each day 6) use your nebulizer machine as needed for increased shortness of breath or wheezing 7) Follow up in 3 months or as needed

## 2016-02-02 ENCOUNTER — Encounter
Admission: RE | Admit: 2016-02-02 | Discharge: 2016-02-02 | Disposition: A | Discharge status: Home / Self Care | Payer: Commercial Managed Care - HMO | Source: Ambulatory Visit | Attending: Orthopedic Surgery | Admitting: Orthopedic Surgery

## 2016-02-02 DIAGNOSIS — Z01812 Encounter for preprocedural laboratory examination: Secondary | ICD-10-CM | POA: Insufficient documentation

## 2016-02-02 DIAGNOSIS — J449 Chronic obstructive pulmonary disease, unspecified: Secondary | ICD-10-CM | POA: Diagnosis not present

## 2016-02-02 DIAGNOSIS — Z01810 Encounter for preprocedural cardiovascular examination: Secondary | ICD-10-CM | POA: Diagnosis not present

## 2016-02-02 LAB — BASIC METABOLIC PANEL
Anion gap: 9 (ref 5–15)
BUN: 19 mg/dL (ref 6–20)
CALCIUM: 9.5 mg/dL (ref 8.9–10.3)
CO2: 24 mmol/L (ref 22–32)
CREATININE: 1.62 mg/dL — AB (ref 0.44–1.00)
Chloride: 106 mmol/L (ref 101–111)
GFR calc Af Amer: 35 mL/min — ABNORMAL LOW (ref >60)
GFR calc non Af Amer: 30 mL/min — ABNORMAL LOW (ref >60)
GLUCOSE: 96 mg/dL (ref 65–99)
Potassium: 4.1 mmol/L (ref 3.5–5.1)
Sodium: 139 mmol/L (ref 135–145)

## 2016-02-02 LAB — SEDIMENTATION RATE: SED RATE: 45 mm/h — AB (ref 0–30)

## 2016-02-02 LAB — CBC
HCT: 40.8 % (ref 35.0–47.0)
HEMOGLOBIN: 13.6 g/dL (ref 12.0–16.0)
MCH: 30.9 pg (ref 26.0–34.0)
MCHC: 33.3 g/dL (ref 32.0–36.0)
MCV: 92.6 fL (ref 80.0–100.0)
PLATELETS: 282 10*3/uL (ref 150–440)
RBC: 4.41 MIL/uL (ref 3.80–5.20)
RDW: 15 % — ABNORMAL HIGH (ref 11.5–14.5)
WBC: 5.2 10*3/uL (ref 3.6–11.0)

## 2016-02-02 LAB — SURGICAL PCR SCREEN
MRSA, PCR: NEGATIVE
Staphylococcus aureus: NEGATIVE

## 2016-02-02 LAB — PROTIME-INR
INR: 0.9
PROTHROMBIN TIME: 12.1 s (ref 11.4–15.2)

## 2016-02-02 LAB — TYPE AND SCREEN
ABO/RH(D): O POS
ANTIBODY SCREEN: NEGATIVE

## 2016-02-02 LAB — APTT: aPTT: 30 seconds (ref 24–36)

## 2016-02-02 NOTE — Patient Instructions (Signed)
  Your procedure is scheduled OY:DXAJOIN Dec. 26 , 2017. Report to Same Day Surgery. To find out your arrival time please call [phone] between 1PM - 3PM on Friday Dec. 22, 2017.  Remember: Instructions that are not followed completely may result in serious medical risk, up to and including death, or upon the discretion of your surgeon and anesthesiologist your surgery may need to be rescheduled.    _x___ 1. Do not eat food or drink liquids after midnight. No gum chewing or hard candies.     ____ 2. No Alcohol for 24 hours before or after surgery.   ____ 3. Bring all medications with you on the day of surgery if instructed.    __x__ 4. Notify your doctor if there is any change in your medical condition     (cold, fever, infections).    __x___ 5. No smoking 24 hours prior to surgery.     Do not wear jewelry, make-up, hairpins, clips or nail polish.  Do not wear lotions, powders, or perfumes.   Do not shave 48 hours prior to surgery. Men may shave face and neck.  Do not bring valuables to the hospital.    [facility] is not responsible for any belongings or valuables.               Contacts, dentures or bridgework may not be worn into surgery.  Leave your suitcase in the car. After surgery it may be brought to your room.  For patients admitted to the hospital, discharge time is determined by your treatment team.   Patients discharged the day of surgery will not be allowed to drive home.    Please read over the following fact sheets that you were given:   [facility] Preparing for Surgery  _x___ Take these medicines the morning of surgery with A SIP OF WATER:    1. FLUoxetine (PROZAC)  2. hydrALAZINE (APRESOLINE)  3. metoprolol tartrate (LOPRESSOR)   4. omeprazole (PRILOSEC)   ____ Fleet Enema (as directed)   _x___ Use CHG Soap as directed on instruction sheet  _x___ Use inhalers on the day of surgery and bring to hospital day of surgery  ____ Stop metformin 2 days  prior to surgery    ____ Take 1/2 of usual insulin dose the night before surgery and none on the morning of surgery.   _x___ Stop aspirin now.  _x___ Stop Anti-inflammatories such as Advil, Aleve, Ibuprofen, Motrin, Naproxen, Naprosyn, Goodies powders or aspirin products. OK to take Tylenol or Tramadol.   ____ Stop supplements until after surgery.    ____ Bring C-Pap to the hospital.

## 2016-02-03 ENCOUNTER — Inpatient Hospital Stay
Admission: RE | Admit: 2016-02-03 | Discharge: 2016-02-03 | Disposition: A | Discharge status: Home / Self Care | Payer: Commercial Managed Care - HMO | Source: Ambulatory Visit

## 2016-02-03 NOTE — Pre-Procedure Instructions (Signed)
Pt did bring in a urine sample as pre arranged. Spoke with husband as pt was asleep, encouraged pt to bring in a urine sample tomorrow for upcoming surgery next week.  He reports she did not urinate very much this am and will try to bring in a urine sample tomorrow.

## 2016-02-04 ENCOUNTER — Encounter
Admission: RE | Admit: 2016-02-04 | Discharge: 2016-02-04 | Disposition: A | Discharge status: Home / Self Care | Payer: Commercial Managed Care - HMO | Source: Ambulatory Visit | Attending: Orthopedic Surgery | Admitting: Orthopedic Surgery

## 2016-02-04 DIAGNOSIS — J449 Chronic obstructive pulmonary disease, unspecified: Secondary | ICD-10-CM | POA: Diagnosis not present

## 2016-02-04 LAB — URINALYSIS, ROUTINE W REFLEX MICROSCOPIC
Bilirubin Urine: NEGATIVE
GLUCOSE, UA: NEGATIVE mg/dL
Ketones, ur: NEGATIVE mg/dL
Nitrite: NEGATIVE
PH: 6 (ref 5.0–8.0)
PROTEIN: NEGATIVE mg/dL
SPECIFIC GRAVITY, URINE: 1.009 (ref 1.005–1.030)

## 2016-02-07 MED ORDER — CEFAZOLIN SODIUM-DEXTROSE 2-4 GM/100ML-% IV SOLN
2.0000 g | Freq: Once | INTRAVENOUS | Status: AC
  Administered 2016-02-08: 2 g via INTRAVENOUS

## 2016-02-07 MED ORDER — TRANEXAMIC ACID 1000 MG/10ML IV SOLN
1000.0000 mg | INTRAVENOUS | Status: AC
  Administered 2016-02-08: 1000 mg via INTRAVENOUS
  Filled 2016-02-07: qty 10

## 2016-02-08 ENCOUNTER — Encounter
Admission: RE | Disposition: A | Discharge status: Skilled Nursing Facility | Payer: Self-pay | Source: Ambulatory Visit | Attending: Orthopedic Surgery

## 2016-02-08 ENCOUNTER — Inpatient Hospital Stay: Payer: Commercial Managed Care - HMO | Admitting: Certified Registered Nurse Anesthetist

## 2016-02-08 ENCOUNTER — Inpatient Hospital Stay: Payer: Commercial Managed Care - HMO

## 2016-02-08 ENCOUNTER — Inpatient Hospital Stay
Admission: RE | Admit: 2016-02-08 | Discharge: 2016-02-11 | DRG: 470 | Disposition: A | Discharge status: Skilled Nursing Facility | Payer: Commercial Managed Care - HMO | Source: Ambulatory Visit | Attending: Orthopedic Surgery | Admitting: Orthopedic Surgery

## 2016-02-08 ENCOUNTER — Encounter: Payer: Self-pay | Admitting: *Deleted

## 2016-02-08 DIAGNOSIS — K5909 Other constipation: Secondary | ICD-10-CM | POA: Diagnosis present

## 2016-02-08 DIAGNOSIS — J9610 Chronic respiratory failure, unspecified whether with hypoxia or hypercapnia: Secondary | ICD-10-CM | POA: Diagnosis not present

## 2016-02-08 DIAGNOSIS — I1310 Hypertensive heart and chronic kidney disease without heart failure, with stage 1 through stage 4 chronic kidney disease, or unspecified chronic kidney disease: Secondary | ICD-10-CM | POA: Diagnosis present

## 2016-02-08 DIAGNOSIS — M1711 Unilateral primary osteoarthritis, right knee: Secondary | ICD-10-CM | POA: Diagnosis not present

## 2016-02-08 DIAGNOSIS — R2689 Other abnormalities of gait and mobility: Secondary | ICD-10-CM

## 2016-02-08 DIAGNOSIS — N183 Chronic kidney disease, stage 3 (moderate): Secondary | ICD-10-CM | POA: Diagnosis present

## 2016-02-08 DIAGNOSIS — I2510 Atherosclerotic heart disease of native coronary artery without angina pectoris: Secondary | ICD-10-CM | POA: Diagnosis present

## 2016-02-08 DIAGNOSIS — J449 Chronic obstructive pulmonary disease, unspecified: Secondary | ICD-10-CM | POA: Diagnosis not present

## 2016-02-08 DIAGNOSIS — Z5189 Encounter for other specified aftercare: Secondary | ICD-10-CM | POA: Diagnosis not present

## 2016-02-08 DIAGNOSIS — Z6841 Body Mass Index (BMI) 40.0 and over, adult: Secondary | ICD-10-CM | POA: Diagnosis not present

## 2016-02-08 DIAGNOSIS — F17200 Nicotine dependence, unspecified, uncomplicated: Secondary | ICD-10-CM | POA: Diagnosis present

## 2016-02-08 DIAGNOSIS — Z23 Encounter for immunization: Secondary | ICD-10-CM

## 2016-02-08 DIAGNOSIS — I10 Essential (primary) hypertension: Secondary | ICD-10-CM | POA: Diagnosis not present

## 2016-02-08 DIAGNOSIS — M6281 Muscle weakness (generalized): Secondary | ICD-10-CM | POA: Diagnosis not present

## 2016-02-08 DIAGNOSIS — Z96651 Presence of right artificial knee joint: Secondary | ICD-10-CM | POA: Diagnosis not present

## 2016-02-08 DIAGNOSIS — G8918 Other acute postprocedural pain: Secondary | ICD-10-CM

## 2016-02-08 DIAGNOSIS — K089 Disorder of teeth and supporting structures, unspecified: Secondary | ICD-10-CM | POA: Diagnosis present

## 2016-02-08 DIAGNOSIS — E559 Vitamin D deficiency, unspecified: Secondary | ICD-10-CM | POA: Diagnosis present

## 2016-02-08 DIAGNOSIS — G4733 Obstructive sleep apnea (adult) (pediatric): Secondary | ICD-10-CM | POA: Diagnosis present

## 2016-02-08 DIAGNOSIS — N179 Acute kidney failure, unspecified: Secondary | ICD-10-CM | POA: Diagnosis not present

## 2016-02-08 DIAGNOSIS — Z471 Aftercare following joint replacement surgery: Secondary | ICD-10-CM | POA: Diagnosis not present

## 2016-02-08 DIAGNOSIS — D62 Acute posthemorrhagic anemia: Secondary | ICD-10-CM | POA: Diagnosis not present

## 2016-02-08 DIAGNOSIS — K219 Gastro-esophageal reflux disease without esophagitis: Secondary | ICD-10-CM | POA: Diagnosis not present

## 2016-02-08 DIAGNOSIS — F3289 Other specified depressive episodes: Secondary | ICD-10-CM | POA: Diagnosis not present

## 2016-02-08 DIAGNOSIS — Z9989 Dependence on other enabling machines and devices: Secondary | ICD-10-CM | POA: Diagnosis not present

## 2016-02-08 DIAGNOSIS — M25561 Pain in right knee: Secondary | ICD-10-CM | POA: Diagnosis not present

## 2016-02-08 HISTORY — PX: TOTAL KNEE ARTHROPLASTY: SHX125

## 2016-02-08 LAB — CBC
HEMATOCRIT: 39.1 % (ref 35.0–47.0)
Hemoglobin: 12.9 g/dL (ref 12.0–16.0)
MCH: 30.5 pg (ref 26.0–34.0)
MCHC: 32.9 g/dL (ref 32.0–36.0)
MCV: 92.8 fL (ref 80.0–100.0)
PLATELETS: 255 10*3/uL (ref 150–440)
RBC: 4.21 MIL/uL (ref 3.80–5.20)
RDW: 15.3 % — AB (ref 11.5–14.5)
WBC: 6.9 10*3/uL (ref 3.6–11.0)

## 2016-02-08 LAB — ABO/RH: ABO/RH(D): O POS

## 2016-02-08 LAB — CREATININE, SERUM
Creatinine, Ser: 1.58 mg/dL — ABNORMAL HIGH (ref 0.44–1.00)
GFR calc Af Amer: 36 mL/min — ABNORMAL LOW (ref >60)
GFR calc non Af Amer: 31 mL/min — ABNORMAL LOW (ref >60)

## 2016-02-08 SURGERY — ARTHROPLASTY, KNEE, TOTAL
Anesthesia: Spinal | Laterality: Right | Wound class: Clean

## 2016-02-08 MED ORDER — FENTANYL CITRATE (PF) 100 MCG/2ML IJ SOLN
INTRAMUSCULAR | Status: AC
Start: 2016-02-08 — End: 2016-02-08
  Filled 2016-02-08: qty 2

## 2016-02-08 MED ORDER — ACETAMINOPHEN 10 MG/ML IV SOLN
INTRAVENOUS | Status: DC | PRN
  Administered 2016-02-08: 1000 mg via INTRAVENOUS

## 2016-02-08 MED ORDER — PROPOFOL 10 MG/ML IV BOLUS
INTRAVENOUS | Status: DC | PRN
  Administered 2016-02-08: 20 mg via INTRAVENOUS

## 2016-02-08 MED ORDER — MENTHOL 3 MG MT LOZG
1.0000 | LOZENGE | OROMUCOSAL | Status: DC | PRN
  Filled 2016-02-08: qty 9

## 2016-02-08 MED ORDER — ONDANSETRON HCL 4 MG/2ML IJ SOLN: 4.0000 mg | Freq: Four times a day (QID) | INTRAMUSCULAR | Status: DC | PRN

## 2016-02-08 MED ORDER — LIDOCAINE HCL (CARDIAC) 20 MG/ML IV SOLN
INTRAVENOUS | Status: DC | PRN
  Administered 2016-02-08: 60 mg via INTRAVENOUS

## 2016-02-08 MED ORDER — ASPIRIN EC 81 MG PO TBEC
81.0000 mg | DELAYED_RELEASE_TABLET | Freq: Every day | ORAL | Status: DC
  Administered 2016-02-08 – 2016-02-11 (×3): 81 mg via ORAL
  Filled 2016-02-08 (×3): qty 1

## 2016-02-08 MED ORDER — OXYCODONE HCL 5 MG PO TABS
5.0000 mg | ORAL_TABLET | ORAL | Status: DC | PRN
  Administered 2016-02-08 (×2): 10 mg via ORAL
  Administered 2016-02-09: 5 mg via ORAL
  Administered 2016-02-09 (×2): 10 mg via ORAL
  Administered 2016-02-10 (×2): 5 mg via ORAL
  Filled 2016-02-08: qty 1
  Filled 2016-02-08 (×2): qty 2
  Filled 2016-02-08: qty 1
  Filled 2016-02-08: qty 2
  Filled 2016-02-08: qty 1
  Filled 2016-02-08: qty 2

## 2016-02-08 MED ORDER — VITAMIN D 1000 UNITS PO TABS
1000.0000 [IU] | ORAL_TABLET | Freq: Every day | ORAL | Status: DC
  Administered 2016-02-08 – 2016-02-11 (×4): 1000 [IU] via ORAL
  Filled 2016-02-08 (×4): qty 1

## 2016-02-08 MED ORDER — LORATADINE 10 MG PO TABS
10.0000 mg | ORAL_TABLET | Freq: Every day | ORAL | Status: DC
Start: 2016-02-08 — End: 2016-02-11
  Administered 2016-02-08 – 2016-02-11 (×4): 10 mg via ORAL
  Filled 2016-02-08 (×4): qty 1

## 2016-02-08 MED ORDER — ONDANSETRON HCL 4 MG/2ML IJ SOLN: 4.0000 mg | Freq: Once | INTRAMUSCULAR | Status: DC | PRN

## 2016-02-08 MED ORDER — BUPIVACAINE LIPOSOME 1.3 % IJ SUSP
INTRAMUSCULAR | Status: AC
  Filled 2016-02-08: qty 20

## 2016-02-08 MED ORDER — MORPHINE SULFATE 10 MG/ML IJ SOLN
INTRAMUSCULAR | Status: DC | PRN
  Administered 2016-02-08: 10 mg via INTRAVENOUS

## 2016-02-08 MED ORDER — METOPROLOL TARTRATE 25 MG PO TABS
12.5000 mg | ORAL_TABLET | Freq: Two times a day (BID) | ORAL | Status: DC
  Administered 2016-02-08 – 2016-02-11 (×6): 12.5 mg via ORAL
  Filled 2016-02-08 (×2): qty 1
  Filled 2016-02-08: qty 0.5
  Filled 2016-02-08 (×4): qty 1

## 2016-02-08 MED ORDER — BUPIVACAINE HCL (PF) 0.5 % IJ SOLN
INTRAMUSCULAR | Status: AC
  Filled 2016-02-08: qty 30

## 2016-02-08 MED ORDER — PHENYLEPHRINE HCL 10 MG/ML IJ SOLN
INTRAMUSCULAR | Status: AC
  Filled 2016-02-08: qty 1

## 2016-02-08 MED ORDER — PHENOL 1.4 % MT LIQD
1.0000 | OROMUCOSAL | Status: DC | PRN
  Filled 2016-02-08: qty 177

## 2016-02-08 MED ORDER — MONTELUKAST SODIUM 10 MG PO TABS
10.0000 mg | ORAL_TABLET | Freq: Every day | ORAL | Status: DC
  Administered 2016-02-08 – 2016-02-10 (×3): 10 mg via ORAL
  Filled 2016-02-08 (×3): qty 1

## 2016-02-08 MED ORDER — IPRATROPIUM-ALBUTEROL 0.5-2.5 (3) MG/3ML IN SOLN
RESPIRATORY_TRACT | Status: AC
  Administered 2016-02-08: 3 mL via RESPIRATORY_TRACT
  Filled 2016-02-08: qty 3

## 2016-02-08 MED ORDER — SODIUM CHLORIDE 0.9 % IJ SOLN
INTRAMUSCULAR | Status: AC
  Filled 2016-02-08: qty 10

## 2016-02-08 MED ORDER — ACETAMINOPHEN 650 MG RE SUPP: 650.0000 mg | Freq: Four times a day (QID) | RECTAL | Status: DC | PRN

## 2016-02-08 MED ORDER — ALBUTEROL SULFATE (2.5 MG/3ML) 0.083% IN NEBU: 2.5000 mg | INHALATION_SOLUTION | RESPIRATORY_TRACT | Status: DC | PRN

## 2016-02-08 MED ORDER — METOCLOPRAMIDE HCL 10 MG PO TABS: 5.0000 mg | ORAL_TABLET | Freq: Three times a day (TID) | ORAL | Status: DC | PRN

## 2016-02-08 MED ORDER — ZOLPIDEM TARTRATE 5 MG PO TABS
5.0000 mg | ORAL_TABLET | Freq: Every evening | ORAL | Status: DC | PRN
  Administered 2016-02-08: 5 mg via ORAL
  Filled 2016-02-08: qty 1

## 2016-02-08 MED ORDER — LIDOCAINE 2% (20 MG/ML) 5 ML SYRINGE
INTRAMUSCULAR | Status: AC
  Filled 2016-02-08: qty 5

## 2016-02-08 MED ORDER — METOCLOPRAMIDE HCL 5 MG/ML IJ SOLN: 5.0000 mg | Freq: Three times a day (TID) | INTRAMUSCULAR | Status: DC | PRN

## 2016-02-08 MED ORDER — METHOCARBAMOL 500 MG PO TABS
500.0000 mg | ORAL_TABLET | Freq: Four times a day (QID) | ORAL | Status: DC | PRN
  Administered 2016-02-08 – 2016-02-09 (×2): 500 mg via ORAL
  Filled 2016-02-08 (×2): qty 1

## 2016-02-08 MED ORDER — IPRATROPIUM-ALBUTEROL 0.5-2.5 (3) MG/3ML IN SOLN
3.0000 mL | Freq: Four times a day (QID) | RESPIRATORY_TRACT | Status: DC
  Administered 2016-02-08 – 2016-02-11 (×14): 3 mL via RESPIRATORY_TRACT
  Filled 2016-02-08 (×13): qty 3

## 2016-02-08 MED ORDER — CEFAZOLIN SODIUM-DEXTROSE 2-4 GM/100ML-% IV SOLN
2.0000 g | Freq: Four times a day (QID) | INTRAVENOUS | Status: AC
  Administered 2016-02-08 – 2016-02-09 (×3): 2 g via INTRAVENOUS
  Filled 2016-02-08 (×3): qty 100

## 2016-02-08 MED ORDER — BUPIVACAINE-EPINEPHRINE (PF) 0.25% -1:200000 IJ SOLN
INTRAMUSCULAR | Status: DC | PRN
  Administered 2016-02-08: 30 mL

## 2016-02-08 MED ORDER — MORPHINE SULFATE (PF) 2 MG/ML IV SOLN
2.0000 mg | INTRAVENOUS | Status: DC | PRN
  Administered 2016-02-08: 2 mg via INTRAVENOUS
  Filled 2016-02-08: qty 1

## 2016-02-08 MED ORDER — ACETAMINOPHEN 10 MG/ML IV SOLN
INTRAVENOUS | Status: AC
  Filled 2016-02-08: qty 100

## 2016-02-08 MED ORDER — KETAMINE HCL 10 MG/ML IJ SOLN
INTRAMUSCULAR | Status: DC | PRN
  Administered 2016-02-08: 25 mg via INTRAVENOUS

## 2016-02-08 MED ORDER — CEFAZOLIN SODIUM-DEXTROSE 2-4 GM/100ML-% IV SOLN
INTRAVENOUS | Status: AC
  Filled 2016-02-08: qty 100

## 2016-02-08 MED ORDER — BISACODYL 10 MG RE SUPP: 10.0000 mg | Freq: Every day | RECTAL | Status: DC | PRN

## 2016-02-08 MED ORDER — DIPHENHYDRAMINE HCL 12.5 MG/5ML PO ELIX: 12.5000 mg | ORAL_SOLUTION | ORAL | Status: DC | PRN

## 2016-02-08 MED ORDER — GLYCOPYRROLATE 0.2 MG/ML IJ SOLN
INTRAMUSCULAR | Status: AC
  Filled 2016-02-08: qty 1

## 2016-02-08 MED ORDER — PROPOFOL 500 MG/50ML IV EMUL
INTRAVENOUS | Status: AC
  Filled 2016-02-08: qty 50

## 2016-02-08 MED ORDER — PROPOFOL 500 MG/50ML IV EMUL
INTRAVENOUS | Status: DC | PRN
  Administered 2016-02-08: 35 ug/kg/min via INTRAVENOUS

## 2016-02-08 MED ORDER — ONDANSETRON HCL 4 MG/2ML IJ SOLN
INTRAMUSCULAR | Status: AC
  Filled 2016-02-08: qty 2

## 2016-02-08 MED ORDER — BUPIVACAINE-EPINEPHRINE (PF) 0.25% -1:200000 IJ SOLN
INTRAMUSCULAR | Status: AC
  Filled 2016-02-08: qty 30

## 2016-02-08 MED ORDER — FLUOXETINE HCL 10 MG PO CAPS
10.0000 mg | ORAL_CAPSULE | Freq: Every day | ORAL | Status: DC
  Administered 2016-02-09 – 2016-02-11 (×3): 10 mg via ORAL
  Filled 2016-02-08 (×3): qty 1

## 2016-02-08 MED ORDER — EPINEPHRINE PF 1 MG/10ML IJ SOSY
PREFILLED_SYRINGE | INTRAMUSCULAR | Status: DC | PRN
  Administered 2016-02-08: .001 ug via INTRAVENOUS

## 2016-02-08 MED ORDER — PHENYLEPHRINE HCL 10 MG/ML IJ SOLN
INTRAMUSCULAR | Status: DC | PRN
  Administered 2016-02-08: 100 ug via INTRAVENOUS
  Administered 2016-02-08: 80 ug via INTRAVENOUS
  Administered 2016-02-08: 100 ug via INTRAVENOUS

## 2016-02-08 MED ORDER — SODIUM CHLORIDE 0.9 % IV SOLN
INTRAVENOUS | Status: DC
  Administered 2016-02-08 – 2016-02-09 (×4): via INTRAVENOUS

## 2016-02-08 MED ORDER — HYDRALAZINE HCL 25 MG PO TABS
25.0000 mg | ORAL_TABLET | Freq: Two times a day (BID) | ORAL | Status: DC
  Administered 2016-02-08 – 2016-02-11 (×5): 25 mg via ORAL
  Filled 2016-02-08 (×5): qty 1

## 2016-02-08 MED ORDER — METHOCARBAMOL 1000 MG/10ML IJ SOLN
500.0000 mg | Freq: Four times a day (QID) | INTRAVENOUS | Status: DC | PRN
  Filled 2016-02-08: qty 5

## 2016-02-08 MED ORDER — SODIUM CHLORIDE 0.9 % IV SOLN
INTRAVENOUS | Status: DC | PRN
  Administered 2016-02-08: 60 mL

## 2016-02-08 MED ORDER — INFLUENZA VAC SPLIT QUAD 0.5 ML IM SUSY: 0.5000 mL | PREFILLED_SYRINGE | INTRAMUSCULAR | Status: DC | PRN

## 2016-02-08 MED ORDER — ACETAMINOPHEN 325 MG PO TABS: 650.0000 mg | ORAL_TABLET | Freq: Four times a day (QID) | ORAL | Status: DC | PRN

## 2016-02-08 MED ORDER — ONDANSETRON HCL 4 MG PO TABS: 4.0000 mg | ORAL_TABLET | Freq: Four times a day (QID) | ORAL | Status: DC | PRN

## 2016-02-08 MED ORDER — BUPIVACAINE HCL (PF) 0.5 % IJ SOLN
INTRAMUSCULAR | Status: DC | PRN
  Administered 2016-02-08: 3 mL

## 2016-02-08 MED ORDER — EPHEDRINE SULFATE 50 MG/ML IJ SOLN
INTRAMUSCULAR | Status: AC
  Filled 2016-02-08: qty 1

## 2016-02-08 MED ORDER — GLYCOPYRROLATE 0.2 MG/ML IJ SOLN
INTRAMUSCULAR | Status: DC | PRN
  Administered 2016-02-08: 0.2 mg via INTRAVENOUS

## 2016-02-08 MED ORDER — FENTANYL CITRATE (PF) 100 MCG/2ML IJ SOLN
INTRAMUSCULAR | Status: DC | PRN
  Administered 2016-02-08: 25 ug via INTRAVENOUS

## 2016-02-08 MED ORDER — MIDAZOLAM HCL 5 MG/5ML IJ SOLN
INTRAMUSCULAR | Status: DC | PRN
  Administered 2016-02-08 (×2): 1 mg via INTRAVENOUS

## 2016-02-08 MED ORDER — MORPHINE SULFATE (PF) 10 MG/ML IV SOLN
INTRAVENOUS | Status: AC
  Filled 2016-02-08: qty 1

## 2016-02-08 MED ORDER — MAGNESIUM CITRATE PO SOLN
1.0000 | Freq: Once | ORAL | Status: AC | PRN
  Administered 2016-02-10: 1 via ORAL
  Filled 2016-02-08 (×2): qty 296

## 2016-02-08 MED ORDER — MIDAZOLAM HCL 2 MG/2ML IJ SOLN
INTRAMUSCULAR | Status: AC
  Filled 2016-02-08: qty 2

## 2016-02-08 MED ORDER — IPRATROPIUM-ALBUTEROL 0.5-2.5 (3) MG/3ML IN SOLN: 3.0000 mL | Freq: Four times a day (QID) | RESPIRATORY_TRACT | Status: DC | PRN

## 2016-02-08 MED ORDER — ONDANSETRON HCL 4 MG/2ML IJ SOLN
INTRAMUSCULAR | Status: DC | PRN
  Administered 2016-02-08: 4 mg via INTRAVENOUS
  Administered 2016-02-08: 4 mg

## 2016-02-08 MED ORDER — NEOMYCIN-POLYMYXIN B GU 40-200000 IR SOLN
Status: AC
  Filled 2016-02-08: qty 20

## 2016-02-08 MED ORDER — ALUM & MAG HYDROXIDE-SIMETH 200-200-20 MG/5ML PO SUSP: 30.0000 mL | ORAL | Status: DC | PRN

## 2016-02-08 MED ORDER — MAGNESIUM HYDROXIDE 400 MG/5ML PO SUSP
30.0000 mL | Freq: Every day | ORAL | Status: DC | PRN
  Administered 2016-02-10: 30 mL via ORAL
  Filled 2016-02-08 (×2): qty 30

## 2016-02-08 MED ORDER — TIOTROPIUM BROMIDE MONOHYDRATE 18 MCG IN CAPS
18.0000 ug | ORAL_CAPSULE | Freq: Every day | RESPIRATORY_TRACT | Status: DC
  Administered 2016-02-09 – 2016-02-11 (×3): 18 ug via RESPIRATORY_TRACT
  Filled 2016-02-08: qty 5

## 2016-02-08 MED ORDER — FENTANYL CITRATE (PF) 100 MCG/2ML IJ SOLN: 25.0000 ug | INTRAMUSCULAR | Status: DC | PRN

## 2016-02-08 MED ORDER — KETAMINE HCL 10 MG/ML IJ SOLN
INTRAMUSCULAR | Status: AC
  Filled 2016-02-08: qty 1

## 2016-02-08 MED ORDER — FLUTICASONE FUROATE-VILANTEROL 100-25 MCG/INH IN AEPB
1.0000 | INHALATION_SPRAY | Freq: Every day | RESPIRATORY_TRACT | Status: DC
  Administered 2016-02-08 – 2016-02-11 (×4): 1 via RESPIRATORY_TRACT
  Filled 2016-02-08: qty 28

## 2016-02-08 MED ORDER — EPHEDRINE SULFATE 50 MG/ML IJ SOLN
5.0000 mg | INTRAMUSCULAR | Status: DC | PRN
Start: 2016-02-08 — End: 2016-02-08
  Administered 2016-02-08: 5 mg via INTRAVENOUS

## 2016-02-08 MED ORDER — SODIUM CHLORIDE 0.9 % IV SOLN
INTRAVENOUS | Status: DC
  Administered 2016-02-08: 15:00:00 via INTRAVENOUS

## 2016-02-08 MED ORDER — SODIUM CHLORIDE 0.9 % IJ SOLN
INTRAMUSCULAR | Status: AC
  Filled 2016-02-08: qty 100

## 2016-02-08 MED ORDER — DOCUSATE SODIUM 100 MG PO CAPS
100.0000 mg | ORAL_CAPSULE | Freq: Two times a day (BID) | ORAL | Status: DC
  Administered 2016-02-08 – 2016-02-11 (×7): 100 mg via ORAL
  Filled 2016-02-08 (×7): qty 1

## 2016-02-08 MED ORDER — NEOMYCIN-POLYMYXIN B GU 40-200000 IR SOLN
Status: DC | PRN
  Administered 2016-02-08: 16 mL

## 2016-02-08 MED ORDER — PANTOPRAZOLE SODIUM 40 MG PO TBEC
40.0000 mg | DELAYED_RELEASE_TABLET | Freq: Every day | ORAL | Status: DC
  Administered 2016-02-09 – 2016-02-11 (×3): 40 mg via ORAL
  Filled 2016-02-08 (×3): qty 1

## 2016-02-08 MED ORDER — SODIUM CHLORIDE 0.9 % IV SOLN
INTRAVENOUS | Status: DC | PRN
  Administered 2016-02-08: 35 ug/min via INTRAVENOUS

## 2016-02-08 MED ORDER — ENOXAPARIN SODIUM 30 MG/0.3ML ~~LOC~~ SOLN: 30.0000 mg | Freq: Two times a day (BID) | SUBCUTANEOUS | Status: DC

## 2016-02-08 SURGICAL SUPPLY — 62 items
BANDAGE ACE 6X5 VEL STRL LF (GAUZE/BANDAGES/DRESSINGS) ×3 IMPLANT
BLADE SAW 1 (BLADE) ×3 IMPLANT
BLOCK CUTTING FEMUR 2 RT MED (MISCELLANEOUS) IMPLANT
BLOCK CUTTING TIBIAL 3 RT (MISCELLANEOUS) IMPLANT
BLOCK CUTTING TIBIAL 4 RT MIS (MISCELLANEOUS) IMPLANT
CANISTER SUCT 1200ML W/VALVE (MISCELLANEOUS) ×3 IMPLANT
CANISTER SUCT 3000ML (MISCELLANEOUS) ×6 IMPLANT
CAPT KNEE TOTAL 3 ×3 IMPLANT
CATH FOL LEG HOLDER (MISCELLANEOUS) ×3 IMPLANT
CATH TRAY METER 16FR LF (MISCELLANEOUS) ×3 IMPLANT
CEMENT HV SMART SET (Cement) ×6 IMPLANT
CHLORAPREP W/TINT 26ML (MISCELLANEOUS) ×6 IMPLANT
COOLER POLAR GLACIER W/PUMP (MISCELLANEOUS) ×3 IMPLANT
CUFF TOURN 24 STER (MISCELLANEOUS) IMPLANT
CUFF TOURN 30 STER DUAL PORT (MISCELLANEOUS) IMPLANT
CUFF TOURN 34 STER (MISCELLANEOUS) ×3 IMPLANT
DRAPE INCISE IOBAN 66X45 STRL (DRAPES) ×6 IMPLANT
DRAPE SHEET LG 3/4 BI-LAMINATE (DRAPES) ×6 IMPLANT
ELECT CAUTERY BLADE 6.4 (BLADE) ×3 IMPLANT
ELECT REM PT RETURN 9FT ADLT (ELECTROSURGICAL) ×3
ELECTRODE REM PT RTRN 9FT ADLT (ELECTROSURGICAL) ×1 IMPLANT
GAUZE PETRO XEROFOAM 1X8 (MISCELLANEOUS) ×3 IMPLANT
GAUZE SPONGE 4X4 12PLY STRL (GAUZE/BANDAGES/DRESSINGS) ×3 IMPLANT
GLOVE BIOGEL PI IND STRL 9 (GLOVE) ×1 IMPLANT
GLOVE BIOGEL PI INDICATOR 9 (GLOVE) ×2
GLOVE INDICATOR 8.0 STRL GRN (GLOVE) ×3 IMPLANT
GLOVE SURG ORTHO 8.0 STRL STRW (GLOVE) ×3 IMPLANT
GLOVE SURG SYN 9.0  PF PI (GLOVE) ×2
GLOVE SURG SYN 9.0 PF PI (GLOVE) ×1 IMPLANT
GOWN SRG 2XL LVL 4 RGLN SLV (GOWNS) ×1 IMPLANT
GOWN STRL NON-REIN 2XL LVL4 (GOWNS) ×2
GOWN STRL REUS W/ TWL LRG LVL3 (GOWN DISPOSABLE) ×1 IMPLANT
GOWN STRL REUS W/ TWL XL LVL3 (GOWN DISPOSABLE) ×1 IMPLANT
GOWN STRL REUS W/TWL LRG LVL3 (GOWN DISPOSABLE) ×2
GOWN STRL REUS W/TWL XL LVL3 (GOWN DISPOSABLE) ×2
HANDPIECE INTERPULSE COAX TIP (DISPOSABLE) ×2
HOOD PEEL AWAY FLYTE STAYCOOL (MISCELLANEOUS) ×6 IMPLANT
IMMBOLIZER KNEE 19 BLUE UNIV (SOFTGOODS) ×3 IMPLANT
KIT PREVENA INCISION MGT 13 (CANNISTER) ×3 IMPLANT
KIT RM TURNOVER STRD PROC AR (KITS) ×3 IMPLANT
KNEE MEDACTA TIBIAL/FEMORAL BL (Knees) ×3 IMPLANT
KNIFE SCULPS 14X20 (INSTRUMENTS) ×3 IMPLANT
NDL SAFETY 18GX1.5 (NEEDLE) ×3 IMPLANT
NEEDLE SPNL 18GX3.5 QUINCKE PK (NEEDLE) ×3 IMPLANT
NEEDLE SPNL 20GX3.5 QUINCKE YW (NEEDLE) ×3 IMPLANT
NS IRRIG 1000ML POUR BTL (IV SOLUTION) ×3 IMPLANT
PACK TOTAL KNEE (MISCELLANEOUS) ×3 IMPLANT
PAD WRAPON POLAR KNEE (MISCELLANEOUS) ×1 IMPLANT
SET HNDPC FAN SPRY TIP SCT (DISPOSABLE) ×1 IMPLANT
SOL .9 NS 3000ML IRR  AL (IV SOLUTION) ×2
SOL .9 NS 3000ML IRR UROMATIC (IV SOLUTION) ×1 IMPLANT
STAPLER SKIN PROX 35W (STAPLE) ×3 IMPLANT
SUCTION FRAZIER HANDLE 10FR (MISCELLANEOUS) ×2
SUCTION TUBE FRAZIER 10FR DISP (MISCELLANEOUS) ×1 IMPLANT
SUT DVC 2 QUILL PDO  T11 36X36 (SUTURE) ×2
SUT DVC 2 QUILL PDO T11 36X36 (SUTURE) ×1 IMPLANT
SUT V-LOC 90 ABS DVC 3-0 CL (SUTURE) ×3 IMPLANT
SYR 20CC LL (SYRINGE) ×3 IMPLANT
SYR 50ML LL SCALE MARK (SYRINGE) ×6 IMPLANT
TOWEL OR 17X26 4PK STRL BLUE (TOWEL DISPOSABLE) ×3 IMPLANT
TOWER CARTRIDGE SMART MIX (DISPOSABLE) ×3 IMPLANT
WRAPON POLAR PAD KNEE (MISCELLANEOUS) ×3

## 2016-02-08 NOTE — Transfer of Care (Signed)
Immediate Anesthesia Transfer of Care Note  Patient: Katherine [name]  Procedure(s) Performed: Procedure(s): TOTAL KNEE ARTHROPLASTY (Right)  Patient Location: PACU  Anesthesia Type:Spinal  Level of Consciousness: awake  Airway & Oxygen Therapy: Patient Spontanous Breathing and Patient connected to face mask oxygen  Post-op Assessment: Report given to RN and Post -op Vital signs reviewed and stable  Post vital signs: Reviewed and stable  Last Vitals:  Vitals:   02/08/16 0832 02/08/16 1245  BP: (!) 152/79 (!) 68/45  Pulse: 98 69  Resp: 18 (!) 25  Temp: 36.8 C 36.6 C    Last Pain:  Vitals:   02/08/16 0832  TempSrc: Oral  PainSc: 3       Patients Stated Pain Goal: 2 (81/02/54 8628)  Complications: No apparent anesthesia complications

## 2016-02-08 NOTE — NC FL2 (Signed)
[place] LEVEL OF CARE SCREENING TOOL     IDENTIFICATION  Patient Name: Katherine [name] Birthdate: Feb 02, 1941 Sex: female Admission Date (Current Location): 02/08/2016  [place] and [place] Number:  Engineering geologist and Address:  [facility], [address], [place], [place] [address]      Provider Number: [id number]  Attending Physician Name and Address:  Hessie Knows, MD  Relative Name and Phone Number:       Current Level of Care: Hospital Recommended Level of Care: [place] Prior Approval Number:    Date Approved/Denied:   PASRR Number:  [id number])  Discharge Plan: SNF    Current Diagnoses: Patient Active Problem List   Diagnosis Date Noted  . Primary localized osteoarthritis of right knee 02/08/2016  . Hospital discharge follow-up 12/08/2015  . Acute respiratory failure ([place]) 11/27/2015  . Acute bronchitis 11/27/2015  . Right knee pain 11/10/2015  . Cigarette smoker 09/16/2015  . OSA and COPD overlap syndrome ([place]) 09/16/2015  . Carpal tunnel syndrome of left wrist 09/07/2015  . Rotator cuff syndrome 09/07/2015  . 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 Sleep disturbance 04/14/2015  . Coronary artery disease 03/14/2015  . 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 COPD exacerbation ([place]) 02/10/2015  . Chest pain 09/16/2014  . 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 Anxiety   . Tobacco abuse   . Prurigo nodularis   . Angioedema   . Hypertensive heart disease   . GERD (gastroesophageal reflux disease)   . COPD (chronic obstructive pulmonary disease) ([place])   . Osteoarthritis   . Vitamin D deficiency disease   . Chronic constipation   . Chronic kidney disease   . SOB (shortness of breath) 05/20/2013  . Right foot pain 05/20/2013  . LVH (left ventricular hypertrophy) 05/20/2013  . Essential hypertension 05/20/2013  . Morbid obesity ([place]) 05/20/2013    Orientation RESPIRATION BLADDER Height & Weight      Self, Time, Situation, Place  Normal Continent Weight:   Height:     BEHAVIORAL SYMPTOMS/MOOD NEUROLOGICAL BOWEL NUTRITION STATUS   (none)  (none) Continent Diet (Regular Diet )  AMBULATORY STATUS COMMUNICATION OF NEEDS Skin   Extensive Assist Verbally Surgical wounds, Wound Vac (Incision: Right Knee (Provena Wound Vac Disposable) )                       Personal Care Assistance Level of Assistance  Bathing, Feeding, Dressing Bathing Assistance: Limited assistance Feeding assistance: Independent Dressing Assistance: Limited assistance     Functional Limitations Info  Sight, Hearing, Speech Sight Info: Adequate Hearing Info: Adequate Speech Info: Adequate    SPECIAL CARE FACTORS FREQUENCY  PT (By licensed PT), OT (By licensed OT)     PT Frequency:  (5) OT Frequency:  (5)            Contractures      Additional Factors Info  Code Status, Allergies Code Status Info:  (Full Code. ) Allergies Info:  (Bee Venom, Enalapril Maleate, Other)           Current Medications (02/08/2016):  This is the current hospital active medication list Current Facility-Administered Medications  Medication Dose Route Frequency Provider Last Rate Last Dose  . 0.9 %  sodium chloride infusion   Intravenous Continuous Amy Penwarden, MD 50 mL/hr at 02/08/16 0931    . 0.9 %  sodium chloride infusion   Intravenous Continuous Hessie Knows, MD      . acetaminophen (TYLENOL)  tablet 650 mg  650 mg Oral Q6H PRN Hessie Knows, MD       Or  . acetaminophen (TYLENOL) suppository 650 mg  650 mg Rectal Q6H PRN Hessie Knows, MD      . albuterol (PROVENTIL) (2.5 MG/3ML) 0.083% nebulizer solution 2.5 mg  2.5 mg Inhalation Q4H PRN Hessie Knows, MD      . alum & mag hydroxide-simeth (MAALOX/MYLANTA) 200-200-20 MG/5ML suspension 30 mL  30 mL Oral Q4H PRN Hessie Knows, MD      . aspirin EC tablet 81 mg  81 mg Oral Daily Hessie Knows, MD      . bisacodyl (DULCOLAX) suppository 10 mg  10 mg Rectal Daily PRN  Hessie Knows, MD      . ceFAZolin (ANCEF) IVPB 2g/100 mL premix  2 g Intravenous Q6H Hessie Knows, MD      . cholecalciferol (VITAMIN D) tablet 1,000 Units  1,000 Units Oral Daily Hessie Knows, MD      . diphenhydrAMINE (BENADRYL) 12.5 MG/5ML elixir 12.5-25 mg  12.5-25 mg Oral Q4H PRN Hessie Knows, MD      . docusate sodium (COLACE) capsule 100 mg  100 mg Oral BID Hessie Knows, MD      . Derrill Memo ON 02/09/2016] enoxaparin (LOVENOX) injection 30 mg  30 mg Subcutaneous Q12H Hessie Knows, MD      . ePHEDrine 50 MG/ML injection           . [START ON 02/09/2016] FLUoxetine (PROZAC) capsule 10 mg  10 mg Oral Daily Hessie Knows, MD      . fluticasone furoate-vilanterol (BREO ELLIPTA) 100-25 MCG/INH 1 puff  1 puff Inhalation Daily Hessie Knows, MD      . hydrALAZINE (APRESOLINE) tablet 25 mg  25 mg Oral BID Hessie Knows, MD      . Influenza vac split quadrivalent PF (FLUARIX) injection 0.5 mL  0.5 mL Intramuscular Prior to discharge Hessie Knows, MD      . ipratropium-albuterol (DUONEB) 0.5-2.5 (3) MG/3ML nebulizer solution 3 mL  3 mL Nebulization Q6H Molli Barrows, MD   3 mL at 02/08/16 1424  . ipratropium-albuterol (DUONEB) 0.5-2.5 (3) MG/3ML nebulizer solution 3 mL  3 mL Nebulization Q6H PRN Hessie Knows, MD      . loratadine (CLARITIN) tablet 10 mg  10 mg Oral Daily Hessie Knows, MD      . magnesium citrate solution 1 Bottle  1 Bottle Oral Once PRN Hessie Knows, MD      . magnesium hydroxide (MILK OF MAGNESIA) suspension 30 mL  30 mL Oral Daily PRN Hessie Knows, MD      . menthol-cetylpyridinium (CEPACOL) lozenge 3 mg  1 lozenge Oral PRN Hessie Knows, MD       Or  . phenol (CHLORASEPTIC) mouth spray 1 spray  1 spray Mouth/Throat PRN Hessie Knows, MD      . methocarbamol (ROBAXIN) tablet 500 mg  500 mg Oral Q6H PRN Hessie Knows, MD       Or  . methocarbamol (ROBAXIN) 500 mg in dextrose 5 % 50 mL IVPB  500 mg Intravenous Q6H PRN Hessie Knows, MD      . metoCLOPramide (REGLAN) tablet 5-10 mg  5-10 mg  Oral Q8H PRN Hessie Knows, MD       Or  . metoCLOPramide (REGLAN) injection 5-10 mg  5-10 mg Intravenous Q8H PRN Hessie Knows, MD      . metoprolol tartrate (LOPRESSOR) tablet 12.5 mg  12.5 mg Oral BID Hessie Knows, MD      .  montelukast (SINGULAIR) tablet 10 mg  10 mg Oral QHS Hessie Knows, MD      . morphine 2 MG/ML injection 2 mg  2 mg Intravenous Q1H PRN Hessie Knows, MD      . ondansetron [facility]) 4 MG/2ML injection           . ondansetron (ZOFRAN) tablet 4 mg  4 mg Oral Q6H PRN Hessie Knows, MD       Or  . ondansetron [facility]) injection 4 mg  4 mg Intravenous Q6H PRN Hessie Knows, MD      . oxyCODONE (Oxy IR/ROXICODONE) immediate release tablet 5-10 mg  5-10 mg Oral Q3H PRN Hessie Knows, MD      . Derrill Memo ON 02/09/2016] pantoprazole (PROTONIX) EC tablet 40 mg  40 mg Oral Daily Hessie Knows, MD      . sodium chloride 0.9 % injection           . sodium chloride 0.9 % injection           . tiotropium (SPIRIVA) inhalation capsule 18 mcg  18 mcg Inhalation Daily Hessie Knows, MD      . zolpidem [facility]) tablet 5 mg  5 mg Oral QHS PRN Hessie Knows, MD         Discharge Medications: Please see discharge summary for a list of discharge medications.  Relevant Imaging Results:  Relevant Lab Results:   Additional Information  (SSN: [id number])  Mayukha Symmonds, Veronia Beets, LCSW

## 2016-02-08 NOTE — Progress Notes (Signed)
Spoke with Dr. Andree Elk in reference to blood pressure, meds ordered.

## 2016-02-08 NOTE — H&P (Signed)
Reviewed paper H+P, will be scanned into chart. No changes noted.  

## 2016-02-08 NOTE — Anesthesia Preprocedure Evaluation (Signed)
Anesthesia Evaluation  Patient identified by MRN, date of birth, ID band Patient awake    Reviewed: Allergy & Precautions, H&P , NPO status , Patient's Chart, lab work & pertinent test results, reviewed documented beta blocker date and time   Airway Mallampati: II   Neck ROM: full    Dental  (+) Poor Dentition   Pulmonary neg pulmonary ROS, sleep apnea and Continuous Positive Airway Pressure Ventilation , COPD, Current Smoker,    Pulmonary exam normal        Cardiovascular hypertension, + CAD  negative cardio ROS Normal cardiovascular exam Rhythm:regular Rate:Normal     Neuro/Psych PSYCHIATRIC DISORDERS  Neuromuscular disease negative neurological ROS  negative psych ROS   GI/Hepatic negative GI ROS, Neg liver ROS, GERD  Medicated,  Endo/Other  negative endocrine ROS  Renal/GU Renal diseasenegative Renal ROS  negative genitourinary   Musculoskeletal   Abdominal   Peds  Hematology negative hematology ROS (+)   Anesthesia Other Findings Past Medical History: No date: Angioedema No date: Anxiety No date: Anxiety and depression No date: Chronic constipation No date: Chronic kidney disease     Comment: stage 3 No date: COPD (chronic obstructive pulmonary disease) (* No date: Gallstones No date: GERD (gastroesophageal reflux disease) No date: Hypertension No date: Left ventricular hypertrophy No date: Osteoarthritis No date: Prurigo nodularis No date: Tobacco abuse No date: Vitamin D deficiency disease Past Surgical History: No date: CHOLECYSTECTOMY 11/2011: POLYPECTOMY     Comment: vocal cord   Reproductive/Obstetrics negative OB ROS                             Anesthesia Physical Anesthesia Plan  ASA: III  Anesthesia Plan: General   Post-op Pain Management:    Induction:   Airway Management Planned:   Additional Equipment:   Intra-op Plan:   Post-operative Plan:    Informed Consent: I have reviewed the patients History and Physical, chart, labs and discussed the procedure including the risks, benefits and alternatives for the proposed anesthesia with the patient or authorized representative who has indicated his/her understanding and acceptance.   Dental Advisory Given  Plan Discussed with: CRNA  Anesthesia Plan Comments:         Anesthesia Quick Evaluation

## 2016-02-08 NOTE — Op Note (Signed)
02/08/2016  12:45 PM  PATIENT:  Katherine [name]  75 y.o. female  PRE-OPERATIVE DIAGNOSIS:  primary osteoarthritis right knee  POST-OPERATIVE DIAGNOSIS:  primary osteoarthritis right knee  PROCEDURE:  Procedure(s): TOTAL KNEE ARTHROPLASTY (Right)  SURGEON: Laurene Footman, MD  ASSISTANTS: None  ANESTHESIA:   spinal  EBL:  Total I/O In: 1000 [I.V.:1000] Out: 275 [Urine:125; Blood:150]  BLOOD ADMINISTERED:none  DRAINS: none   LOCAL MEDICATIONS USED:  MARCAINE    and OTHER morphine and Exparel  SPECIMEN:  No Specimen  DISPOSITION OF SPECIMEN:  N/A  COUNTS:  YES  TOURNIQUET:   77 minutes at 300 mmHg  IMPLANTS: Medacta GMK sphere 2 femur 3 tibia with 65 mm stem and 13 mm insert with 2 patella all components cemented  DICTATION: .Dragon Dictation patient brought the operating room and after adequate spinal anesthesia was obtained the right leg was prepped and draped in sterile fashion. After appropriate patient identification and timeout procedure, tourniquet was raised. Midline skin incision was made followed by medial parapatellar arthrotomy. Inspection revealed moderate synovitis in the suprapatellar pouch which was debrided along with a exposed bone in the medial compartment and patellofemoral joint lateral compartment had areas of significant wear but no exposed bone. Anterior cruciate ligament and fat pad were excised and the proximal tibia cutting guide was applied and proximal tibia cut carried out using the my knee system. Next the distal femoral cut was carried out and followed by placement of the 4-in-1 cutting block anterior posterior and chamfer cuts made. Residual horns of the menisci were excised at this time and trials were placed with a 3 patella pinned in position proximal preparation placed followed by the keel punch and trialing of the tube femur with the 3 full range of motion and stability with a 13 mm insert was socially chosen as the final insert distal femoral  drill holes were made followed by the notch cut for the trochlear groove and he's trials were all removed patella was cut using the patellar cutting guide and sized to size 2 after 3 drill holes were made. This point the tourniquet is let down and periarticular injection given tourniquet was then again raised and the joint thoroughly irrigated and the bony surfaces dried. Tibial component was cemented in place first with cement excess cement removed followed by placement of the polyethylene insert with set of the set screw tightened with the torque screwdriver. Femoral component was impacted into place and the elbow extension as the cement set patellar button was clamped into place and excess cement removed after the cemented set excess cement was removed and the patella was noted to track well with no touch technique. The wound was thoroughly irrigated with pulsatile lavage and tourniquet was let down arthrotomy was repaired using a heavy [place]. 3-0 V-LOC subcutaneous closure followed by skin staples and Provena wound VAC. Polar Care and then applied  PLAN OF CARE: Admit to inpatient   PATIENT DISPOSITION:  PACU - hemodynamically stable.

## 2016-02-08 NOTE — Progress Notes (Signed)
PT Hold Note  Patient Details Name: Brittin Belnap MRN: [id number] DOB: 07/11/1940   Cancelled Treatment:    Reason Eval/Treat Not Completed: Medical issues which prohibited therapy. Chart reviewed. Attempted to see patient however pt reports no sensation in RLE. Will hold PT evaluation until next date. RN notified who agreed to dangle this evening. Pt will benefit from skilled PT services to address deficits in strength, balance, and mobility in order to return to full function at home.   Lyndel Safe Huprich PT, DPT   Huprich,Jason 02/08/2016, 3:07 PM

## 2016-02-08 NOTE — Plan of Care (Signed)
Problem: Safety: Goal: Ability to remain free from injury will improve Outcome: Progressing Educated on appropriate safety interventions to prevent falls, also discussed pain management and appropriate goals/expectations

## 2016-02-08 NOTE — Anesthesia Procedure Notes (Signed)
Spinal  Patient location during procedure: OR Start time: 02/08/2016 10:22 AM End time: 02/08/2016 10:29 AM Staffing Anesthesiologist: ADAMS, [name] G Resident/CRNA: ,  Performed: anesthesiologist and resident/CRNA  Preanesthetic Checklist Completed: patient identified, site marked, surgical consent, pre-op evaluation, timeout performed, IV checked, risks and benefits discussed and monitors and equipment checked Spinal Block Patient position: sitting Prep: ChloraPrep Patient monitoring: heart rate, continuous pulse ox, blood pressure and cardiac monitor Approach: midline Location: L3-4 Injection technique: single-shot Needle Needle type: [place] and Introducer  Needle gauge: 24 G Needle length: 9 cm Assessment Sensory level: T10 Additional Notes Negative paresthesia. Negative blood return. Positive free-flowing CSF. Expiration date of kit checked and confirmed. Patient tolerated procedure well, without complications.       

## 2016-02-09 LAB — BASIC METABOLIC PANEL WITH GFR
Anion gap: 8 (ref 5–15)
BUN: 26 mg/dL — ABNORMAL HIGH (ref 6–20)
CO2: 22 mmol/L (ref 22–32)
Calcium: 8.5 mg/dL — ABNORMAL LOW (ref 8.9–10.3)
Chloride: 107 mmol/L (ref 101–111)
Creatinine, Ser: 2.05 mg/dL — ABNORMAL HIGH (ref 0.44–1.00)
GFR calc Af Amer: 26 mL/min — ABNORMAL LOW
GFR calc non Af Amer: 23 mL/min — ABNORMAL LOW
Glucose, Bld: 120 mg/dL — ABNORMAL HIGH (ref 65–99)
Potassium: 4.7 mmol/L (ref 3.5–5.1)
Sodium: 137 mmol/L (ref 135–145)

## 2016-02-09 LAB — CBC
HEMATOCRIT: 36.1 % (ref 35.0–47.0)
HEMOGLOBIN: 11.6 g/dL — AB (ref 12.0–16.0)
MCH: 30.2 pg (ref 26.0–34.0)
MCHC: 32.2 g/dL (ref 32.0–36.0)
MCV: 93.8 fL (ref 80.0–100.0)
Platelets: 227 10*3/uL (ref 150–440)
RBC: 3.85 MIL/uL (ref 3.80–5.20)
RDW: 14.7 % — ABNORMAL HIGH (ref 11.5–14.5)
WBC: 8.5 10*3/uL (ref 3.6–11.0)

## 2016-02-09 MED ORDER — ENOXAPARIN SODIUM 40 MG/0.4ML ~~LOC~~ SOLN
40.0000 mg | SUBCUTANEOUS | Status: DC
  Administered 2016-02-09 – 2016-02-11 (×3): 40 mg via SUBCUTANEOUS
  Filled 2016-02-09 (×3): qty 0.4

## 2016-02-09 NOTE — Progress Notes (Signed)
Anticoagulation monitoring(Lovenox):  75 yo  female ordered Lovenox 30 mg Q12h  BMI  48  Lab Results  Component Value Date   CREATININE 2.05 (H) 02/09/2016   CREATININE 1.58 (H) 02/08/2016   CREATININE 1.62 (H) 02/02/2016   Estimated Creatinine Clearance: 28.4 mL/min (by C-G formula based on SCr of 2.05 mg/dL (H)). Hemoglobin & Hematocrit     Component Value Date/Time   HGB 11.6 (L) 02/09/2016 0430   HGB 14.3 02/20/2013 1147   HCT 36.1 02/09/2016 0430   HCT 40.3 03/08/2015 1640     Per Protocol for Patient with estCrcl < 30 ml/min and BMI > 40, will transition to Lovenox 40 mg Q24h.

## 2016-02-09 NOTE — Progress Notes (Signed)
PT Cancellation Note  Patient Details Name: Katherine [name] MRN: [id number] DOB: 1940/04/25   Cancelled Treatment:    Reason Eval/Treat Not Completed: Pain limiting ability to participate; Attempted to see pt for afternoon session in early afternoon with pt refusing treatment secondary to R knee pain; spoke to nursing regarding pt's refusal with hope of later afternoon session with pain medication administered as appropriate.  Second attempt made in late afternoon with pt again refusing treatment secondary to continence issues and R knee pain, nursing aware.   Linus Salmons PT, DPT 02/09/16, 3:33 PM

## 2016-02-09 NOTE — Evaluation (Signed)
Physical Therapy Evaluation Patient Details Name: Katherine [name] MRN: [id number] DOB: 04-09-1940 Today's Date: 02/09/2016   History of Present Illness  Pt. is a 75 y.o. female who was admitted to [facility] for a right TKR secondary to R knee OA.  Clinical Impression  Pt presents with deficits in strength, transfers, mobility, R knee ROM, gait, balance, and activity tolerance.  Pt required Mod A with bed mobility and min A with transfers.  Pt able to amb approx 4' with RW and CGA but was limited by fatigue and refused chair time.  Pt on 2LO2/min and baseline SpO2 95%, HR 98 bpm.  After amb SpO2 dropped to 92% and HR increased to 103 bpm with mod SOB.  Nursing aware and provided inhaler meds.  Pt unable to perform ind RLE SLR and KI was donned to RLE during standing tasks.  Pt will benefit from PT services to address above deficits for decreased caregiver assistance upon discharge.      Follow Up Recommendations SNF    Equipment Recommendations  None recommended by PT    Recommendations for Other Services       Precautions / Restrictions Precautions Precautions: Fall;Knee Precaution Booklet Issued: Yes (comment) Required Braces or Orthoses: Knee Immobilizer - Right Knee Immobilizer - Right: On when out of bed or walking Restrictions Weight Bearing Restrictions: Yes RLE Weight Bearing: Weight bearing as tolerated      Mobility  Bed Mobility Overal bed mobility: Needs Assistance Bed Mobility: Supine to Sit;Sit to Supine     Supine to sit: Mod assist Sit to supine: Mod assist   General bed mobility comments: Total assist for RLE in/out of bed with mod A overall for bed mobility tasks  Transfers Overall transfer level: Needs assistance Equipment used: Rolling walker (2 wheeled) Transfers: Sit to/from Stand Sit to Stand: Min assist         General transfer comment: Mod verbal cues for sequencing with KI donned to RLE  Ambulation/Gait Ambulation/Gait assistance: Min  guard Ambulation Distance (Feet): 4 Feet Assistive device: Rolling walker (2 wheeled) Gait Pattern/deviations: Step-to pattern;Antalgic   Gait velocity interpretation: Below normal speed for age/gender General Gait Details: KI donned to RLE with amb, limitd amb secondary to fatigue  Stairs            Wheelchair Mobility    Modified Rankin (Stroke Patients Only)       Balance Overall balance assessment: Needs assistance Sitting-balance support: No upper extremity supported Sitting balance-Leahy Scale: Good     Standing balance support: Bilateral upper extremity supported Standing balance-Leahy Scale: Fair                               Pertinent Vitals/Pain Pain Assessment: No/denies pain Pain Score: 4  Pain Location: RIght knee Pain Intervention(s): Limited activity within patient's tolerance    Home Living Family/patient expects to be discharged to:: Private residence Living Arrangements: Spouse/significant other Available Help at Discharge: Family;Available 24 hours/day Type of Home: Apartment Home Access: Level entry     Home Layout: One level Home Equipment: Cane - single point;Walker - 2 wheels      Prior Function Level of Independence: Independent with assistive device(s)   Gait / Transfers Assistance Needed: Pt reports Mod I with amb using SPC limited community distances with no fall history  ADL's / Homemaking Assistance Needed: Pt. reports having minimal assist with ADL/IADL tasks.        Hand  Dominance   Dominant Hand: Right    Extremity/Trunk Assessment   Upper Extremity Assessment Upper Extremity Assessment: Overall WFL for tasks assessed    Lower Extremity Assessment Lower Extremity Assessment: Generalized weakness;RLE deficits/detail RLE Deficits / Details: Pt unable to perform Ind RLE SLR RLE: Unable to fully assess due to pain RLE Sensation:  (Light touch and proprioception grossly intact to BLEs)        Communication   Communication: No difficulties  Cognition Arousal/Alertness: Lethargic Behavior During Therapy: Flat affect Overall Cognitive Status: Within Functional Limits for tasks assessed                 General Comments: Pt. lethargic, and reports feeling tired secondary to medications, nursing aware    General Comments      Exercises Total Joint Exercises Ankle Circles/Pumps: AROM;Both;10 reps;15 reps Quad Sets: AROM;Right;10 reps;15 reps Gluteal Sets: AROM;Both;10 reps Hip ABduction/ADduction: AAROM;Right;10 reps Straight Leg Raises: AAROM;Right;10 reps Long Arc Quad: AROM;Right;10 reps Knee Flexion: AROM;Right;10 reps Marching in Standing: AROM;5 reps  R Knee A/AAROM:  Flex 70/82 deg; Ext -4/-2 deg.   Assessment/Plan    PT Assessment Patient needs continued PT services  PT Problem List Decreased strength;Decreased range of motion;Decreased activity tolerance;Decreased balance;Decreased mobility;Decreased knowledge of use of DME          PT Treatment Interventions DME instruction;Gait training;Functional mobility training;Therapeutic activities;Therapeutic exercise;Balance training;Neuromuscular re-education;Patient/family education    PT Goals (Current goals can be found in the Care Plan section)  Acute Rehab PT Goals Patient Stated Goal: To walk like I used to PT Goal Formulation: With patient Time For Goal Achievement: 02/22/16 Potential to Achieve Goals: Good    Frequency BID   Barriers to discharge        Co-evaluation               End of Session Equipment Utilized During Treatment: Gait belt;Oxygen Activity Tolerance: Patient limited by fatigue Patient left: in bed;with bed alarm set;with SCD's reapplied;with call bell/phone within reach (Polar care donned to RLE, pt refused bone foam, heels elevated) Nurse Communication: Mobility status         Time: 0370-4888 PT Time Calculation (min) (ACUTE ONLY): 53 min   Charges:   PT  Evaluation $PT Eval Low Complexity: 1 Procedure PT Treatments $Therapeutic Exercise: 8-22 mins $Therapeutic Activity: 8-22 mins   PT G Codes:        DRoyetta Asal PT, DPT 02/09/16, 12:21 PM

## 2016-02-09 NOTE — Evaluation (Signed)
Occupational Therapy Evaluation Patient Details Name: Katherine [name] MRN: [id number] DOB: 10/23/1940 Today's Date: 02/09/2016    History of Present Illness Pt. is a 75 y.o. female who was admitted to [facility] for a right TKR.   Clinical Impression   Pt. Is a 75 y.o. female who was admitted for a right TKR. Pt presents with limited ROM, Pain, lethargy, weakness, and impaired functional mobility which hinder her ability to complete ADL and IADL tasks. Pt. could benefit from skilled OT services to review A/E use for LE ADLs, to review necessary home modifications, and to improve functional mobility for ADL/IADLs in order to work towards improving ADL/IADL functioning.     Follow Up Recommendations  SNF    Equipment Recommendations  Tub/shower seat    Recommendations for Other Services       Precautions / Restrictions Precautions Required Braces or Orthoses: Knee Immobilizer - Right Knee Immobilizer - Right: On when out of bed or walking Restrictions Weight Bearing Restrictions: No                                                     ADL Overall ADL's : Needs assistance/impaired     Grooming: Set up               Lower Body Dressing: Maximal assistance                 General ADL Comments: Pt. education was provided about A/E use for LE ADLs.     Vision     Perception     Praxis      Pertinent Vitals/Pain Pain Assessment: 0-10 Pain Score: 4  Pain Location: RIght knee Pain Intervention(s): Limited activity within patient's tolerance     Hand Dominance     Extremity/Trunk Assessment Upper Extremity Assessment Upper Extremity Assessment: Overall WFL for tasks assessed (Left 5th digit numbness.)           Communication Communication Communication: No difficulties   Cognition Arousal/Alertness: Lethargic Behavior During Therapy: Flat affect Overall Cognitive Status: Within Functional Limits for tasks assessed                  General Comments: Pt. lethargic, and reports feeling "doped up" on the pain medicine.   General Comments       Exercises       Shoulder Instructions      Home Living Family/patient expects to be discharged to:: Private residence Living Arrangements: Spouse/significant other Available Help at Discharge: Family;Available 24 hours/day Type of Home: House Home Access: Level entry     Home Layout: One level     Bathroom Shower/Tub: Tub/shower unit;Curtain Shower/tub characteristics: Architectural technologist: Standard     Home Equipment: Environmental consultant - 2 wheels;Cane - single point          Prior Functioning/Environment Level of Independence: Needs assistance    ADL's / Homemaking Assistance Needed: Pt. reports having assist with ADL/IADL tasks.            OT Problem List: Decreased strength;Pain;Decreased activity tolerance;Decreased safety awareness;Decreased knowledge of use of DME or AE;Decreased range of motion   OT Treatment/Interventions: Self-care/ADL training;Therapeutic exercise;Therapeutic activities;DME and/or AE instruction;Patient/family education    OT Goals(Current goals can be found in the care plan section) Acute Rehab OT Goals Patient Stated Goal: To return home  OT Goal Formulation: With patient Potential to Achieve Goals: Good  OT Frequency: Min 1X/week   Barriers to D/C:            Co-evaluation              End of Session    Activity Tolerance: Patient limited by lethargy Patient left: in bed;with call bell/phone within reach;with bed alarm set   Time: 1025-1046 OT Time Calculation (min): 21 min Charges:  OT General Charges $OT Visit: 1 Procedure OT Evaluation $OT Eval Moderate Complexity: 1 Procedure G-Codes:    Harrel Carina, MS, OTR/L 02/09/2016, 11:31 AM

## 2016-02-09 NOTE — Progress Notes (Addendum)
This Probation officer received report from [place] ,rn at 1500. Pt has voided via bedpan after two bladder scans verifying 300-400+ in her bladder,pt consented to try voiding when offered a straight cath instead. Pt was offered pain medication and refused, although cried out loudly in pain when she was moved by staff.

## 2016-02-09 NOTE — Clinical Social Work Placement (Signed)
   CLINICAL SOCIAL WORK PLACEMENT  NOTE  Date:  02/09/2016  Patient Details  Name: Katherine [name] MRN: [id number] Date of Birth: 1940/07/31  Clinical Social Work is seeking post-discharge placement for this patient at the [place] level of care (*CSW will initial, date and re-position this form in  chart as items are completed):  Yes   Patient/family provided with [place] Work Department's list of facilities offering this level of care within the geographic area requested by the patient (or if unable, by the patient's family).  Yes   Patient/family informed of their freedom to choose among providers that offer the needed level of care, that participate in Medicare, Medicaid or managed care program needed by the patient, have an available bed and are willing to accept the patient.  Yes   Patient/family informed of [place]'s ownership interest in [facility] and [facility], as well as of the fact that they are under no obligation to receive care at these facilities.  PASRR submitted to EDS on 02/09/16     PASRR number received on 02/09/16     Existing PASRR number confirmed on       FL2 transmitted to all facilities in geographic area requested by pt/family on 02/09/16     FL2 transmitted to all facilities within larger geographic area on       Patient informed that his/her managed care company has contracts with or will negotiate with certain facilities, including the following:        Yes   Patient/family informed of bed offers received.  Patient chooses bed at  ([facility] )     Physician recommends and patient chooses bed at      Patient to be transferred to   on  .  Patient to be transferred to facility by       Patient family notified on   of transfer.  Name of family member notified:        PHYSICIAN       Additional Comment:    _______________________________________________ Liberty Seto, Veronia Beets, LCSW 02/09/2016, 4:28  PM

## 2016-02-09 NOTE — Progress Notes (Signed)
Patient A+O, slept well with good control from pain medications.  Refuses bone foam.

## 2016-02-09 NOTE — Clinical Social Work Note (Signed)
Clinical Social Work Assessment  Patient Details  Name: Katherine [name] MRN: [id number] Date of Birth: August 21, 1940  Date of referral:  02/09/16               Reason for consult:  Facility Placement                Permission sought to share information with:  Chartered certified accountant granted to share information::  Yes, Verbal Permission Granted  Name::      [facility]   Agency::   [place]   Relationship::     Contact Information:     Housing/Transportation Living arrangements for the past 2 months:  [place] of Information:  Patient Patient Interpreter Needed:  None Criminal Activity/Legal Involvement Pertinent to Current Situation/Hospitalization:  No - Comment as needed Significant Relationships:  Spouse Lives with:  Spouse Do you feel safe going back to the place where you live?  Yes Need for family participation in patient care:  Yes (Comment)  Care giving concerns:  Patient lives in [place] with her husband Katherine [name].    Social Worker assessment / plan:  Holiday representative (CSW) received SNF consult. PT is recommending SNF. CSW met with patient alone at bedside to address consult. Patient is alert and oriented and sitting up in the bed. CSW introduced self and explained role of CSW department. Patient reported that she lives with her husband and wants to go to [facility] for SNF. CSW explained that her insurance [facility] will require authorization. FL2 complete and faxed out. CSW presented bed offers to patient and she chose [facility]. [facility] liason is aware of accepted bed offer. [place] case manager is aware of above. CSW will continue to follow and assist as needed.    Employment status:  Retired Nurse, adult PT Recommendations:  [place] / Referral to community resources:  [place]  Patient/Family's Response to care:  Patient is agreeable to go to [facility].    Patient/Family's Understanding of and Emotional Response to Diagnosis, Current Treatment, and Prognosis:  Patient was very pleasant and thanked CSW for assistance.   Emotional Assessment Appearance:  Appears stated age Attitude/Demeanor/Rapport:    Affect (typically observed):  Accepting, Adaptable, Pleasant Orientation:  Oriented to Self, Oriented to Place, Oriented to  Time, Oriented to Situation Alcohol / Substance use:  Not Applicable Psych involvement (Current and /or in the community):  No (Comment)  Discharge Needs  Concerns to be addressed:  Discharge Planning Concerns Readmission within the last 30 days:  No Current discharge risk:  Dependent with Mobility Barriers to Discharge:  Continued Medical Work up   [employer], Veronia Beets, LCSW 02/09/2016, 4:31 PM

## 2016-02-09 NOTE — Progress Notes (Signed)
Subjective: 1 Day Post-Op Procedure(s) (LRB): TOTAL KNEE ARTHROPLASTY (Right) Patient reports pain as moderate.   Patient is well but has elevated kidney function labs this AM. Care management to assist with discharge today. Negative for chest pain and shortness of breath Fever: no Gastrointestinal:Negative for nausea and vomiting  Objective: Vital signs in last 24 hours: Temp:  [97.5 F (36.4 C)-99.4 F (37.4 C)] 98.4 F (36.9 C) (12/27 0407) Pulse Rate:  [67-98] 91 (12/27 0407) Resp:  [16-28] 18 (12/27 0407) BP: (68-152)/(45-120) 118/81 (12/27 0407) SpO2:  [92 %-100 %] 92 % (12/27 0407) FiO2 (%):  [28 %] 28 % (12/26 1415)  Intake/Output from previous day:  Intake/Output Summary (Last 24 hours) at 02/09/16 0729 Last data filed at 02/09/16 0402  Gross per 24 hour  Intake          2503.33 ml  Output              625 ml  Net          1878.33 ml    Intake/Output this shift: No intake/output data recorded.  Labs:  Recent Labs  02/08/16 0919 02/09/16 0430  HGB 12.9 11.6*    Recent Labs  02/08/16 0919 02/09/16 0430  WBC 6.9 8.5  RBC 4.21 3.85  HCT 39.1 36.1  PLT 255 227    Recent Labs  02/08/16 0919 02/09/16 0430  NA  --  137  K  --  4.7  CL  --  107  CO2  --  22  BUN  --  26*  CREATININE 1.58* 2.05*  GLUCOSE  --  120*  CALCIUM  --  8.5*   No results for input(s): LABPT, INR in the last 72 hours.   EXAM General - Patient is Alert, Appropriate and Oriented Extremity - ABD soft Sensation intact distally Intact pulses distally Incision: no drainage, Woundvac in place Dressing/Incision - clean, dry Motor Function - intact, moving foot and toes well on exam.   Abd soft of exam with normal BS.  Past Medical History:  Diagnosis Date  . Angioedema   . Anxiety   . Anxiety and depression   . Chronic constipation   . Chronic kidney disease    stage 3  . COPD (chronic obstructive pulmonary disease) ([place])   . Gallstones   . GERD (gastroesophageal  reflux disease)   . Hypertension   . Left ventricular hypertrophy   . Osteoarthritis   . Prurigo nodularis   . Tobacco abuse   . Vitamin D deficiency disease     Assessment/Plan: 1 Day Post-Op Procedure(s) (LRB): TOTAL KNEE ARTHROPLASTY (Right) Active Problems:   Primary localized osteoarthritis of right knee  Estimated body mass index is 47.99 kg/m as calculated from the following:   Height as of 02/02/16: 5\' 1"  (1.549 m).   Weight as of 02/02/16: 115.2 kg (254 lb). Advance diet Up with therapy   Gentle IV hydration today.  Labs reviewed this AM. Acute Kidney Injury- BUN 26, Cr 2.05, baseline for Cr appears to be around 1.55, baseline for BUN appears around 10.  Encouraged increased oral intake today, hold ASA this AM as the patient is on lovenox. Hg stable at 11.6. CBC and BMP ordered for tomorrow morning. Up with therapy today.  DVT Prophylaxis - Lovenox, Foot Pumps and TED hose Weight-Bearing as tolerated to right leg  J. Cameron Proud, PA-C [facility] Orthopaedic Surgery 02/09/2016, 7:29 AM

## 2016-02-09 NOTE — Anesthesia Postprocedure Evaluation (Signed)
Anesthesia Post Note  Patient: Katherine [name]  Procedure(s) Performed: Procedure(s) (LRB): TOTAL KNEE ARTHROPLASTY (Right)  Patient location during evaluation: Nursing Unit Anesthesia Type: Spinal Level of consciousness: awake and alert and oriented Pain management: satisfactory to patient Vital Signs Assessment: post-procedure vital signs reviewed and stable Respiratory status: spontaneous breathing and respiratory function stable Cardiovascular status: blood pressure returned to baseline and stable Postop Assessment: no headache, spinal receding, no backache, no signs of nausea or vomiting, adequate PO intake and patient able to bend at knees Anesthetic complications: no     Last Vitals:  Vitals:   02/08/16 2353 02/09/16 0407  BP: 109/77 118/81  Pulse: 97 91  Resp: 18 18  Temp: 37.4 C 36.9 C    Last Pain:  Vitals:   02/09/16 0455  TempSrc:   PainSc: [name]

## 2016-02-09 NOTE — Progress Notes (Signed)
Notified Dr Rudene Christians of blood pressure 115/69. Received order to hold hydralazine 25 mg tonight.

## 2016-02-10 LAB — CBC
HCT: 31.3 % — ABNORMAL LOW (ref 35.0–47.0)
Hemoglobin: 10.4 g/dL — ABNORMAL LOW (ref 12.0–16.0)
MCH: 31 pg (ref 26.0–34.0)
MCHC: 33.1 g/dL (ref 32.0–36.0)
MCV: 93.5 fL (ref 80.0–100.0)
PLATELETS: 205 10*3/uL (ref 150–440)
RBC: 3.34 MIL/uL — AB (ref 3.80–5.20)
RDW: 14.5 % (ref 11.5–14.5)
WBC: 9.5 10*3/uL (ref 3.6–11.0)

## 2016-02-10 LAB — BASIC METABOLIC PANEL
ANION GAP: 7 (ref 5–15)
BUN: 36 mg/dL — ABNORMAL HIGH (ref 6–20)
CALCIUM: 8.4 mg/dL — AB (ref 8.9–10.3)
CO2: 22 mmol/L (ref 22–32)
Chloride: 104 mmol/L (ref 101–111)
Creatinine, Ser: 2.15 mg/dL — ABNORMAL HIGH (ref 0.44–1.00)
GFR, EST AFRICAN AMERICAN: 25 mL/min — AB (ref >60)
GFR, EST NON AFRICAN AMERICAN: 21 mL/min — AB (ref >60)
Glucose, Bld: 104 mg/dL — ABNORMAL HIGH (ref 65–99)
Potassium: 5 mmol/L (ref 3.5–5.1)
Sodium: 133 mmol/L — ABNORMAL LOW (ref 135–145)

## 2016-02-10 LAB — CK: CK TOTAL: 212 U/L (ref 38–234)

## 2016-02-10 MED ORDER — TRAMADOL HCL 50 MG PO TABS: 50.0000 mg | ORAL_TABLET | Freq: Four times a day (QID) | ORAL | 1 refills | Status: DC | PRN

## 2016-02-10 MED ORDER — OXYCODONE HCL 5 MG PO TABS: 5.0000 mg | ORAL_TABLET | ORAL | 0 refills | Status: DC | PRN

## 2016-02-10 MED ORDER — ENOXAPARIN SODIUM 40 MG/0.4ML ~~LOC~~ SOLN: 40.0000 mg | SUBCUTANEOUS | 0 refills | Status: DC

## 2016-02-10 MED ORDER — SODIUM POLYSTYRENE SULFONATE 15 GM/60ML PO SUSP
30.0000 g | Freq: Once | ORAL | Status: AC
  Administered 2016-02-10: 30 g via ORAL
  Filled 2016-02-10: qty 120

## 2016-02-10 NOTE — Progress Notes (Signed)
Physical Therapy Treatment Patient Details Name: Katherine [name] MRN: [id number] DOB: 1940-11-23 Today's Date: 02/10/2016    History of Present Illness Pt. is a 75 y.o. female who was admitted to [facility] for a right TKR secondary to R knee OA.    PT Comments    Pt continues to present with deficits in strength, transfers, R knee ROM, mobility, gait, balance, and activity tolerance.  Pt able to amb 3 x 6' with RW and CGA and remains limited by activity tolerance.  Pt remains unable to perform Ind RLE SLR and KI donned to RLE with standing activities.  Pt tolerated gentle R knee ROM activities better this session with R knee flex A/AAROM 62/80 deg.  Pt continues to require assistance with bed mobility primarily with getting RLE in/out of bed. Pt will benefit from PT services to address above deficits for decreased caregiver assistance upon discharge.      Follow Up Recommendations  SNF     Equipment Recommendations  None recommended by PT    Recommendations for Other Services       Precautions / Restrictions Precautions Precautions: Fall;Knee Precaution Booklet Issued: Yes (comment) Required Braces or Orthoses: Knee Immobilizer - Right Knee Immobilizer - Right: On when out of bed or walking Restrictions Weight Bearing Restrictions: Yes RLE Weight Bearing: Weight bearing as tolerated    Mobility  Bed Mobility Overal bed mobility: Needs Assistance Bed Mobility: Sit to Supine;Supine to Sit     Supine to sit: Min assist Sit to supine: Min assist      Transfers Overall transfer level: Needs assistance Equipment used: Rolling walker (2 wheeled) Transfers: Sit to/from Stand Sit to Stand: Min guard         General transfer comment: Mod verbal cues for sequencing with KI donned to RLE  Ambulation/Gait Ambulation/Gait assistance: Min guard Ambulation Distance (Feet): 6 Feet Assistive device: Rolling walker (2 wheeled) Gait Pattern/deviations: Step-to pattern;Antalgic    Gait velocity interpretation: Below normal speed for age/gender General Gait Details: KI donned to RLE with amb   Stairs            Wheelchair Mobility    Modified Rankin (Stroke Patients Only)       Balance Overall balance assessment: Needs assistance Sitting-balance support: No upper extremity supported Sitting balance-Leahy Scale: Good     Standing balance support: Bilateral upper extremity supported Standing balance-Leahy Scale: Fair                      Cognition Arousal/Alertness: Awake/alert Behavior During Therapy: WFL for tasks assessed/performed Overall Cognitive Status: Within Functional Limits for tasks assessed                      Exercises Total Joint Exercises Ankle Circles/Pumps: AROM;Both;10 reps Quad Sets: AROM;Right;10 reps;15 reps Gluteal Sets: AROM;Both Short Arc Quad: AROM;Right;10 reps Heel Slides: AAROM;Right;Other reps (comment) (4 x 10 gentle, low amplitude) Hip ABduction/ADduction: AAROM;Right;10 reps Straight Leg Raises: AAROM;Right;5 reps;10 reps Long Arc Quad: AROM;Right;10 reps;15 reps Knee Flexion: AROM;Right;10 reps;15 reps Goniometric ROM: R knee A/AAROM: flex 62/80 deg; ext -4/-1 deg    General Comments        Pertinent Vitals/Pain Pain Assessment: 0-10 Pain Score: 3  Pain Descriptors / Indicators: Sore Pain Intervention(s): Monitored during session    Home Living                      Prior Function  PT Goals (current goals can now be found in the care plan section) Progress towards PT goals: Progressing toward goals    Frequency    BID      PT Plan Current plan remains appropriate    Co-evaluation             End of Session Equipment Utilized During Treatment: Gait belt;Oxygen Activity Tolerance: Patient limited by fatigue Patient left: in bed;with bed alarm set;with SCD's reapplied;with call bell/phone within reach (Polar care donned, heels floated, pt  refused bone foam)     Time: 0355-9741 PT Time Calculation (min) (ACUTE ONLY): 45 min  Charges:  $Gait Training: 8-22 mins $Therapeutic Exercise: 23-37 mins                    G Codes:      DRoyetta Asal PT, DPT 02/10/16, 11:56 AM

## 2016-02-10 NOTE — Consult Note (Signed)
[facility] NAME: Katherine [name]    MR#:  [id number]  DATE OF BIRTH:  [birth year]  DATE OF ADMISSION:  02/08/2016  PRIMARY CARE PHYSICIAN: [name], NP   CONSULT REQUESTING/REFERRING PHYSICIAN: Dr. Rudene Christians  REASON FOR CONSULT: Acute kidney injury  CHIEF COMPLAINT:  No chief complaint on file. Right total knee arthroplasty  HISTORY OF PRESENT ILLNESS:  Katherine [name]  is a 75 y.o. female with a known history of COPD, chronic respiratory failure, hypertension, tobacco use, obesity, CKD stage III with baseline creatinine 1.6 presents to the hospital for total knee arthroplasty on the right. Today patient is postop day 2. She has worsening kidney function on her morning labs and hospitalist team has been consulted. Patient follows with Dr. Holley Raring of nephrology as outpatient. Today her creatinine is 2.15. Has clear urine as per patient but correct output cannot be quantified.  PAST MEDICAL HISTORY:   Past Medical History:  Diagnosis Date  . Angioedema   . Anxiety   . Anxiety and depression   . Chronic constipation   . Chronic kidney disease    stage 3  . COPD (chronic obstructive pulmonary disease) ([place])   . Gallstones   . GERD (gastroesophageal reflux disease)   . Hypertension   . Left ventricular hypertrophy   . Osteoarthritis   . Prurigo nodularis   . Tobacco abuse   . Vitamin D deficiency disease     PAST SURGICAL HISTOIRY:   Past Surgical History:  Procedure Laterality Date  . CHOLECYSTECTOMY    . 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   Procedure: TOTAL KNEE ARTHROPLASTY;  Surgeon: Hessie Knows, MD;  Location: [facility] ORS;  Service: Orthopedics;  Laterality: Right;    SOCIAL HISTORY:   Social History  Substance Use Topics  . Smoking status: Light Tobacco Smoker    Packs/day: 0.00    Years: 52.00    Types: Cigarettes, E-cigarettes  . Smokeless tobacco: Never Used   Comment: smokes 1.5 cigarettes daily-12/7  . Alcohol use No     Comment: rare    FAMILY HISTORY:   Family History  Problem Relation Age of Onset  . Alcohol abuse Mother   . Sickle cell anemia Daughter   . Hypertension Son   . Cancer Neg Hx   . COPD Neg Hx   . Diabetes Neg Hx   . Heart disease Neg Hx   . Stroke Neg Hx     DRUG ALLERGIES:   Allergies  Allergen Reactions  . Bee Venom Swelling  . Enalapril Maleate Swelling  . Other     Bee stings    REVIEW OF SYSTEMS:   ROS  CONSTITUTIONAL: No fever, fatigue or weakness.  EYES: No blurred or double vision.  EARS, NOSE, AND THROAT: No tinnitus or ear pain.  RESPIRATORY: No cough, shortness of breath, wheezing or hemoptysis.  CARDIOVASCULAR: No chest pain, orthopnea, edema.  GASTROINTESTINAL: No nausea, vomiting, diarrhea or abdominal pain.  GENITOURINARY: No dysuria, hematuria.  ENDOCRINE: No polyuria, nocturia,  HEMATOLOGY: No anemia, easy bruising or bleeding SKIN: No rash or lesion. MUSCULOSKELETAL: Right knee pain NEUROLOGIC: No tingling, numbness, weakness.  PSYCHIATRY: No anxiety or depression.   MEDICATIONS AT HOME:   Prior to Admission medications   Medication Sig Start Date End Date Taking? Authorizing Provider  acetaminophen (TYLENOL) 650 MG CR tablet Take 650-1,300 mg by mouth every 8 (eight) hours as needed for pain.  Yes Historical Provider, MD  albuterol (PROVENTIL HFA) 108 (90 BASE) MCG/ACT inhaler Inhale 2 puffs into the lungs every 4 (four) hours as needed for wheezing or shortness of breath. 02/02/15  Yes Carrie Mew, MD  aspirin EC 81 MG tablet Take 81 mg by mouth daily.   Yes Historical Provider, MD  cetirizine (ZYRTEC) 10 MG tablet Take 0.5 tablets (5 mg total) by mouth at bedtime. 11/10/15  Yes [name], NP  cholecalciferol (VITAMIN D) 1000 units tablet Take 1,000 Units by mouth daily.   Yes Historical Provider, MD  FLUoxetine (PROZAC) 10 MG capsule Take 10 mg by mouth daily.   Yes  Historical Provider, MD  fluticasone furoate-vilanterol (BREO ELLIPTA) 100-25 MCG/INH AEPB Inhale 1 puff into the lungs daily. 01/20/16  Yes [name], MD  hydrALAZINE (APRESOLINE) 25 MG tablet Take 1 tablet (25 mg total) by mouth 2 (two) times daily. 11/10/15  Yes [name], NP  ipratropium-albuterol (DUONEB) 0.5-2.5 (3) MG/3ML SOLN Take 3 mLs by nebulization every 6 (six) hours as needed. 11/29/15  Yes [name], MD  metoprolol tartrate (LOPRESSOR) 25 MG tablet Take 0.5 tablets (12.5 mg total) by mouth 2 (two) times daily. 09/18/14  Yes Aldean Jewett, MD  montelukast (SINGULAIR) 10 MG tablet Take 1 tablet (10 mg total) by mouth at bedtime. 11/29/15  Yes [name], MD  omeprazole (PRILOSEC) 40 MG capsule Take 40 mg by mouth daily.   Yes Historical Provider, MD  SPIRIVA HANDIHALER 18 MCG inhalation capsule PLACE 1 CAPSULE INTO INHALER AND INHALE ONCE DAILY 09/14/15  Yes [name], NP  traMADol (ULTRAM) 50 MG tablet Take 50-100 mg by mouth every 6 (six) hours as needed for pain. 01/19/16  Yes Historical Provider, MD  enoxaparin (LOVENOX) 40 MG/0.4ML injection Inject 0.4 mLs (40 mg total) into the skin daily. 02/11/16   Watt Climes, PA  oxyCODONE (OXY IR/ROXICODONE) 5 MG immediate release tablet Take 1-2 tablets (5-10 mg total) by mouth every 3 (three) hours as needed for breakthrough pain. 02/10/16   Watt Climes, PA      VITAL SIGNS:  Blood pressure (!) 122/57, pulse 87, temperature 98.3 F (36.8 C), temperature source Oral, resp. rate 12, SpO2 96 %.  PHYSICAL EXAMINATION:  GENERAL:  75 y.o.-year-old patient lying in the bed with no acute distress. Obese EYES: Pupils equal, round, reactive to light and accommodation. No scleral icterus. Extraocular muscles intact.  HEENT: Head atraumatic, normocephalic. Oropharynx and nasopharynx clear.  NECK:  Supple, no jugular venous distention. No thyroid enlargement, no tenderness.  LUNGS: Normal breath sounds bilaterally, no wheezing,  rales,rhonchi or crepitation. No use of accessory muscles of respiration.  CARDIOVASCULAR: S1, S2 normal. No murmurs, rubs, or gallops.  ABDOMEN: Soft, nontender, nondistended. Bowel sounds present. No organomegaly or mass.  EXTREMITIES: Right knee dressing. No edema. NEUROLOGIC: Cranial nerves II through XII are intact. Muscle strength 5/5 in all extremities. Sensation intact. Gait not checked.  PSYCHIATRIC: The patient is alert and oriented x 3.  SKIN: No obvious rash, lesion, or ulcer.   LABORATORY PANEL:   CBC  Recent Labs Lab 02/10/16 0513  WBC 9.5  HGB 10.4*  HCT 31.3*  PLT 205   ------------------------------------------------------------------------------------------------------------------  Chemistries   Recent Labs Lab 02/10/16 0513  NA 133*  K 5.0  CL 104  CO2 22  GLUCOSE 104*  BUN 36*  CREATININE 2.15*  CALCIUM 8.4*   ------------------------------------------------------------------------------------------------------------------  Cardiac Enzymes No results for input(s): TROPONINI in the last 168 hours. ------------------------------------------------------------------------------------------------------------------  RADIOLOGY:  Dg Knee 1-2 Views Right  Result Date: 02/08/2016 CLINICAL DATA:  Postop right knee replacement EXAM: RIGHT KNEE - 1-2 VIEW COMPARISON:  None. FINDINGS: The right knee demonstrates a total knee arthroplasty without evidence of hardware failure complication. There is no significant joint effusion. There is no fracture or dislocation. The alignment is anatomic. Post-surgical changes noted in the surrounding soft tissues. IMPRESSION: Right total knee arthroplasty. Electronically Signed   By: Kathreen Devoid   On: 02/08/2016 13:22    EKG:   Orders placed or performed during the hospital encounter of 02/02/16  . EKG 12-Lead  . EKG 12-Lead    IMPRESSION AND PLAN:   * Acute kidney injury over CKD stage III Baseline creatinine close  to 1.5-1.6. Today creatinine is 2.15. Check urinalysis. Urine creatinine and sodium. Check CK level Monitor daily weight and input output. Check post void bladder scan. IV fluids. Repeat labs in the morning. Avoid nephrotoxic medications. Consult nephrology if no improvement by tomorrow.  * Right total knee arthroplasty Postop day 2.  * COPD with chronic respiratory failure is stable  * Postop acute blood loss anemia. No need for transfusion at this time.  All the records are reviewed and case discussed with Consulting provider. Management plans discussed with the patient, family and they are in agreement.  TOTAL TIME TAKING CARE OF THIS PATIENT: 40 minutes.    Hillary Bow R M.D on 02/10/2016 at 11:29 AM  Between 7am to 6pm - Pager - [phone]  After 6pm go to [email or web address] - password [email or web address] [facility]  Office  [phone]  HL:[email or web address] care Physician: [name], NP  Note: This dictation was prepared with Dragon dictation along with smaller phrase technology. Any transcriptional errors that result from this process are unintentional.

## 2016-02-10 NOTE — Discharge Summary (Addendum)
Physician Discharge Summary  Patient ID: Katherine [name] MRN: [id number] DOB/AGE: May 04, 1940 75 y.o.  Admit date: 02/08/2016 Discharge date: 02/11/2016  Admission Diagnoses:  primary osteoarthritis right knee   Discharge Diagnoses: Patient Active Problem List   Diagnosis Date Noted  . Primary localized osteoarthritis of right knee 02/08/2016  . Hospital discharge follow-up 12/08/2015  . Acute respiratory failure ([place]) 11/27/2015  . Acute bronchitis 11/27/2015  . Right knee pain 11/10/2015  . Cigarette smoker 09/16/2015  . OSA and COPD overlap syndrome ([place]) 09/16/2015  . Carpal tunnel syndrome of left wrist 09/07/2015  . Rotator cuff syndrome 09/07/2015  . 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 Sleep disturbance 04/14/2015  . Coronary artery disease 03/14/2015  . 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 COPD exacerbation ([place]) 02/10/2015  . Chest pain 09/16/2014  . 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 Anxiety   . Tobacco abuse   . Prurigo nodularis   . Angioedema   . Hypertensive heart disease   . GERD (gastroesophageal reflux disease)   . COPD (chronic obstructive pulmonary disease) ([place])   . Osteoarthritis   . Vitamin D deficiency disease   . Chronic constipation   . Chronic kidney disease   . SOB (shortness of breath) 05/20/2013  . Right foot pain 05/20/2013  . LVH (left ventricular hypertrophy) 05/20/2013  . Essential hypertension 05/20/2013  . Morbid obesity ([name]) 05/20/2013    Past Medical History:  Diagnosis Date  . Angioedema   . Anxiety   . Anxiety and depression   . Chronic constipation   . Chronic kidney disease    stage 3  . COPD (chronic obstructive pulmonary disease) ([place])   . Gallstones   . GERD (gastroesophageal reflux disease)   . Hypertension   . Left ventricular hypertrophy   . Osteoarthritis   . Prurigo nodularis   . Tobacco abuse   . Vitamin D deficiency disease      Transfusion: No transfusions during this admission   Consultants (if  any):  hospitalist secondary to elevated creatinine above her baseline.  Discharged Condition: Improved  Hospital Course: Katherine [name] is an 75 y.o. female who was admitted 02/08/2016 with a diagnosis of degenerative arthrosis right knee and went to the operating room on 02/08/2016 and underwent the above named procedures. Patient was kept for another day secondary to some elevation of her creatinine level above her baseline of 1.6. She was noted to be 2.15. IV fluids were given. Follow-up creatinine was 1.47 which improved from the time she came into the hospital.   Surgeries:Procedure(s): TOTAL KNEE ARTHROPLASTY on 02/08/2016  PRE-OPERATIVE DIAGNOSIS:  primary osteoarthritis right knee  POST-OPERATIVE DIAGNOSIS:  primary osteoarthritis right knee  PROCEDURE:  Procedure(s): TOTAL KNEE ARTHROPLASTY (Right)  SURGEON: Laurene Footman, MD  ASSISTANTS: None  ANESTHESIA:   spinal  EBL:  Total I/O In: 1000 [I.V.:1000] Out: 275 [Urine:125; Blood:150]  BLOOD ADMINISTERED:none  DRAINS: none   LOCAL MEDICATIONS USED:  MARCAINE    and OTHER morphine and Exparel  SPECIMEN:  No Specimen  DISPOSITION OF SPECIMEN:  N/A  COUNTS:  YES  TOURNIQUET:   77 minutes at 300 mmHg  IMPLANTS: Medacta GMK sphere 2 femur 3 tibia with 65 mm stem and 13 mm insert with 2 patella all components cemented  Patient tolerated the surgery well. No complications .Patient was taken to PACU where she was stabilized and then transferred to the orthopedic floor.  Patient started on Lovenox 40 mg q 24 hrs. Foot pumps applied bilaterally at 80  mm hgb. Heels elevated off bed with rolled towels. No evidence of DVT. Calves non tender. Negative Homan. Physical therapy started on day #1 for gait training and transfer with OT starting on  day #1 for ADL and assisted devices. Patient has done well with therapy. Patient has refused to work with physical therapy on 2 different occasions. Also refusing a pain  medicine.  Patient's IV and Foley were discontinued on day #1. Wound VAC was continued at the time of discharge to rehabilitation facility.   She was given perioperative antibiotics:  Anti-infectives    Start     Dose/Rate Route Frequency Ordered Stop   02/08/16 1700  ceFAZolin (ANCEF) IVPB 2g/100 mL premix     2 g 200 mL/hr over 30 Minutes Intravenous Every 6 hours 02/08/16 1414 02/09/16 0527   02/08/16 0743  ceFAZolin (ANCEF) 2-4 GM/100ML-% IVPB    Comments:  Idamae Lusher: cabinet override      02/08/16 0743 02/08/16 1044   02/07/16 2230  ceFAZolin (ANCEF) IVPB 2g/100 mL premix     2 g 200 mL/hr over 30 Minutes Intravenous  Once 02/07/16 2216 02/08/16 1059    .  She was fitted with AV 1 compression foot pump devices, instructed on heel pumps early ambulation, and TED stockings bilaterally for DVT prophylaxis.  She benefited maximally from the hospital stay and there were no complications.    Recent vital signs:  Vitals:   02/09/16 1921 02/10/16 0423  BP: 115/69 (!) 110/59  Pulse: 83 87  Resp: 18 18  Temp: 98.4 F (36.9 C) 99 F (37.2 C)    Recent laboratory studies:  Lab Results  Component Value Date   HGB 10.4 (L) 02/10/2016   HGB 11.6 (L) 02/09/2016   HGB 12.9 02/08/2016   Lab Results  Component Value Date   WBC 9.5 02/10/2016   PLT 205 02/10/2016   Lab Results  Component Value Date   INR 0.90 02/02/2016   Lab Results  Component Value Date   NA 133 (L) 02/10/2016   K 5.0 02/10/2016   CL 104 02/10/2016   CO2 22 02/10/2016   BUN 36 (H) 02/10/2016   CREATININE 2.15 (H) 02/10/2016   GLUCOSE 104 (H) 02/10/2016    Discharge Medications:   Allergies as of 02/10/2016      Reactions   Bee Venom Swelling   Enalapril Maleate Swelling   Other    Bee stings      Medication List    TAKE these medications   acetaminophen 650 MG CR tablet Commonly known as:  TYLENOL Take 650-1,300 mg by mouth every 8 (eight) hours as needed for pain.   albuterol 108  (90 Base) MCG/ACT inhaler Commonly known as:  PROVENTIL HFA Inhale 2 puffs into the lungs every 4 (four) hours as needed for wheezing or shortness of breath.   aspirin EC 81 MG tablet Take 81 mg by mouth daily.   cetirizine 10 MG tablet Commonly known as:  ZYRTEC Take 0.5 tablets (5 mg total) by mouth at bedtime.   cholecalciferol 1000 units tablet Commonly known as:  VITAMIN D Take 1,000 Units by mouth daily.   enoxaparin 40 MG/0.4ML injection Commonly known as:  LOVENOX Inject 0.4 mLs (40 mg total) into the skin daily. Start taking on:  02/11/2016   FLUoxetine 10 MG capsule Commonly known as:  PROZAC Take 10 mg by mouth daily.   fluticasone furoate-vilanterol 100-25 MCG/INH Aepb Commonly known as:  BREO ELLIPTA Inhale 1 puff into  the lungs daily.   hydrALAZINE 25 MG tablet Commonly known as:  APRESOLINE Take 1 tablet (25 mg total) by mouth 2 (two) times daily.   ipratropium-albuterol 0.5-2.5 (3) MG/3ML Soln Commonly known as:  DUONEB Take 3 mLs by nebulization every 6 (six) hours as needed.   metoprolol tartrate 25 MG tablet Commonly known as:  LOPRESSOR Take 0.5 tablets (12.5 mg total) by mouth 2 (two) times daily.   montelukast 10 MG tablet Commonly known as:  SINGULAIR Take 1 tablet (10 mg total) by mouth at bedtime.   omeprazole 40 MG capsule Commonly known as:  PRILOSEC Take 40 mg by mouth daily.   oxyCODONE 5 MG immediate release tablet Commonly known as:  Oxy IR/ROXICODONE Take 1-2 tablets (5-10 mg total) by mouth every 3 (three) hours as needed for breakthrough pain.   SPIRIVA HANDIHALER 18 MCG inhalation capsule Generic drug:  tiotropium PLACE 1 CAPSULE INTO INHALER AND INHALE ONCE DAILY   traMADol 50 MG tablet Commonly known as:  ULTRAM Take 50-100 mg by mouth every 6 (six) hours as needed for pain.            Durable Medical Equipment        Start     Ordered   02/08/16 1415  DME Walker rolling  Once    Question:  Patient needs a  walker to treat with the following condition  Answer:  Status post total right knee replacement   02/08/16 1414   02/08/16 1415  DME 3 n 1  Once     02/08/16 1414   02/08/16 1415  DME Bedside commode  Once    Question:  Patient needs a bedside commode to treat with the following condition  Answer:  Status post right knee replacement   02/08/16 1414      Diagnostic Studies: Dg Knee 1-2 Views Right  Result Date: 02/08/2016 CLINICAL DATA:  Postop right knee replacement EXAM: RIGHT KNEE - 1-2 VIEW COMPARISON:  None. FINDINGS: The right knee demonstrates a total knee arthroplasty without evidence of hardware failure complication. There is no significant joint effusion. There is no fracture or dislocation. The alignment is anatomic. Post-surgical changes noted in the surrounding soft tissues. IMPRESSION: Right total knee arthroplasty. Electronically Signed   By: Kathreen Devoid   On: 02/08/2016 13:22    Disposition: 06-Home-Health Care Svc  Discharge Instructions    Diet - low sodium heart healthy    Complete by:  As directed    Increase activity slowly    Complete by:  As directed       Contact information for after-discharge care    Destination    [facility] .   Specialty:  [place] information: [address] [place] [phone]               Signed: Watt Climes 02/10/2016, 7:48 AM

## 2016-02-10 NOTE — Progress Notes (Addendum)
Per RN internal medicine says patient cannot D/C today and they want to watch the renal function. [facility] liaison is aware of above. Plan is for patient to D/C to [facility] when stable. [facility] authorization has been received. Auth # [id number]. Patient is aware of above.   [employer], LCSW [phone]

## 2016-02-10 NOTE — Progress Notes (Signed)
PT Cancellation Note  Patient Details Name: Katherine [name] MRN: [id number] DOB: 08/29/1940   Cancelled Treatment:    Reason Eval/Treat Not Completed: Fatigue/lethargy limiting ability to participate;Patient declined, no reason specified. Treatment attempted and encouraged. Pt refuses despite explanation and encouragement to participate with PT. "I'm so tired and I already did therapy 2 times today; please don't make me do it" Re attempt PT tomorrow.    Larae Grooms, PTA 02/10/2016, 2:27 PM

## 2016-02-10 NOTE — Care Management Important Message (Signed)
Important Message  Patient Details  Name: Katherine [name] MRN: [id number] Date of Birth: December 26, 1940   Medicare Important Message Given:  Yes    Jolly Mango, RN 02/10/2016, 9:36 AM

## 2016-02-10 NOTE — Progress Notes (Signed)
Dr. Rudene Christians placed Bone Foam under patient's foot. Patient rang out less than a minute and stated "get that thing out from under my foot." Explained important of the bone foam and she still refused to use it. This RN removed it and made MD aware.

## 2016-02-10 NOTE — Progress Notes (Signed)
   Subjective: 2 Days Post-Op Procedure(s) (LRB): TOTAL KNEE ARTHROPLASTY (Right) Patient reports pain as 9 on 0-10 scale.   Patient is well, and has had no acute complaints or problems Continue with physical therapy today.  Plan is to go Rehab after hospital stay. [facility] no nausea and no vomiting Patient denies any chest pains or shortness of breath. Patient complains of pain with flexion of the knee Objective: Vital signs in last 24 hours: Temp:  [97.7 F (36.5 C)-99 F (37.2 C)] 99 F (37.2 C) (12/28 0423) Pulse Rate:  [73-98] 87 (12/28 0423) Resp:  [16-22] 18 (12/28 0423) BP: (110-125)/(59-79) 110/59 (12/28 0423) SpO2:  [90 %-100 %] 96 % (12/28 0423) well approximated incision Heels are non tender and elevated off the bed using rolled towels Intake/Output from previous day: 12/27 0701 - 12/28 0700 In: 350 [I.V.:350] Out: 600 [Urine:600] Intake/Output this shift: No intake/output data recorded.   Recent Labs  02/08/16 0919 02/09/16 0430 02/10/16 0513  HGB 12.9 11.6* 10.4*    Recent Labs  02/09/16 0430 02/10/16 0513  WBC 8.5 9.5  RBC 3.85 3.34*  HCT 36.1 31.3*  PLT 227 205    Recent Labs  02/09/16 0430 02/10/16 0513  NA 137 133*  K 4.7 5.0  CL 107 104  CO2 22 22  BUN 26* 36*  CREATININE 2.05* 2.15*  GLUCOSE 120* 104*  CALCIUM 8.5* 8.4*   No results for input(s): LABPT, INR in the last 72 hours.  EXAM General - Patient is Alert, Appropriate and Oriented Extremity - Neurologically intact Neurovascular intact Sensation intact distally Intact pulses distally Dorsiflexion/Plantar flexion intact No cellulitis present Compartment soft Dressing - Wound VAC in place Motor Function - intact, moving foot and toes well on exam.    Past Medical History:  Diagnosis Date  . Angioedema   . Anxiety   . Anxiety and depression   . Chronic constipation   . Chronic kidney disease    stage 3  . COPD (chronic  obstructive pulmonary disease) ([place])   . Gallstones   . GERD (gastroesophageal reflux disease)   . Hypertension   . Left ventricular hypertrophy   . Osteoarthritis   . Prurigo nodularis   . Tobacco abuse   . Vitamin D deficiency disease     Assessment/Plan: 2 Days Post-Op Procedure(s) (LRB): TOTAL KNEE ARTHROPLASTY (Right) Active Problems:   Primary localized osteoarthritis of right knee  Estimated body mass index is 47.99 kg/m as calculated from the following:   Height as of 02/02/16: 5\' 1"  (1.549 m).   Weight as of 02/02/16: 115.2 kg (254 lb). Up with therapy Discharge to SNF  Labs: Were reviewed DVT Prophylaxis - Lovenox, Foot Pumps and TED hose Weight-Bearing as tolerated to right leg Patient needs to have a bowel movement today prior to discharge    Rilee Wendling R. [place] 02/10/2016, 7:38 AM

## 2016-02-10 NOTE — Discharge Instructions (Signed)
° °TOTAL KNEE REPLACEMENT POSTOPERATIVE DIRECTIONS ° °Knee Rehabilitation, Guidelines Following Surgery  °Results after knee surgery are often greatly improved when you follow the exercise, range of motion and muscle strengthening exercises prescribed by your doctor. Safety measures are also important to protect the knee from further injury. Any time any of these exercises cause you to have increased pain or swelling in your knee joint, decrease the amount until you are comfortable again and slowly increase them. If you have problems or questions, call your caregiver or physical therapist for advice.  ° °HOME CARE INSTRUCTIONS  °Remove items at home which could result in a fall. This includes throw rugs or furniture in walking pathways.  °· Continue to use polar care unit on the knee for pain and swelling from surgery. You may notice swelling that will progress down to the foot and ankle.  This is normal after surgery.  Elevate the leg when you are not up walking on it.   °· Continue to use the breathing machine which will help keep your temperature down.  It is common for your temperature to cycle up and down following surgery, especially at night when you are not up moving around and exerting yourself.  The breathing machine keeps your lungs expanded and your temperature down. °· Do not place pillow under knee, focus on keeping the knee STRAIGHT while resting ° °DIET °You may resume your previous home diet once your are discharged from the hospital. ° °DRESSING / WOUND CARE / SHOWERING °You may start showering once staples have been removed. Change the surgical dressing as needed. ° °STAPLE REMOVAL: °Staples are to be removed at two weeks post op and apply benzoin and 1/2 inch steri strips ° °ACTIVITY °Walk with your walker as instructed. °Use walker as long as suggested by your caregivers. °Avoid periods of inactivity such as sitting longer than an hour when not asleep. This helps prevent blood clots.  °You may  resume a sexual relationship in one month or when given the OK by your doctor.  °You may return to work once you are cleared by your doctor.  °Do not drive a car for 6 weeks or until released by you surgeon.  °Do not drive while taking narcotics. ° °WEIGHT BEARING °Weight bearing as tolerated with assist device (walker, cane, etc) as directed, use it as long as suggested by your surgeon or therapist, typically at least 4-6 weeks. ° °POSTOPERATIVE CONSTIPATION PROTOCOL °Constipation - defined medically as fewer than three stools per week and severe constipation as less than one stool per week. ° °One of the most common issues patients have following surgery is constipation.  Even if you have a regular bowel pattern at home, your normal regimen is likely to be disrupted due to multiple reasons following surgery.  Combination of anesthesia, postoperative narcotics, change in appetite and fluid intake all can affect your bowels.  In order to avoid complications following surgery, here are some recommendations in order to help you during your recovery period. ° °Colace (docusate) - Pick up an over-the-counter form of Colace or another stool softener and take twice a day as long as you are requiring postoperative pain medications.  Take with a full glass of water daily.  If you experience loose stools or diarrhea, hold the colace until you stool forms back up.  If your symptoms do not get better within 1 week or if they get worse, check with your doctor. ° °Dulcolax (bisacodyl) - Pick up over-the-counter and   take as directed by the product packaging as needed to assist with the movement of your bowels.  Take with a full glass of water.  Use this product as needed if not relieved by Colace only.  ° °MiraLax (polyethylene glycol) - Pick up over-the-counter to have on hand.  MiraLax is a solution that will increase the amount of water in your bowels to assist with bowel movements.  Take as directed and can mix with a glass  of water, juice, soda, coffee, or tea.  Take if you go more than two days without a movement. °Do not use MiraLax more than once per day. Call your doctor if you are still constipated or irregular after using this medication for 7 days in a row. ° °If you continue to have problems with postoperative constipation, please contact the office for further assistance and recommendations.  If you experience "the worst abdominal pain ever" or develop nausea or vomiting, please contact the office immediatly for further recommendations for treatment. ° °ITCHING ° If you experience itching with your medications, try taking only a single pain pill, or even half a pain pill at a time.  You can also use Benadryl over the counter for itching or also to help with sleep.  ° °TED HOSE STOCKINGS °Wear the elastic stockings on both legs for  6 weeks following surgery during the day but you may remove then at night for sleeping. ° °MEDICATIONS °See your medication summary on the “After Visit Summary” that the nursing staff will review with you prior to discharge.  You may have some home medications which will be placed on hold until you complete the course of blood thinner medication.  It is important for you to complete the blood thinner medication as prescribed by your surgeon.  Continue your approved medications as instructed at time of discharge. ° °PRECAUTIONS °If you experience chest pain or shortness of breath - call 911 immediately for transfer to the hospital emergency department.  °If you develop a fever greater that 101 F, purulent drainage from wound, increased redness or drainage from wound, foul odor from the wound/dressing, or calf pain - CONTACT YOUR SURGEON.   °                                                °FOLLOW-UP APPOINTMENTS °Make sure you keep all of your appointments after your operation with your surgeon and caregivers. You should call the office at the above phone number and make an appointment for  approximately two weeks after the date of your surgery or on the date instructed by your surgeon outlined in the "After Visit Summary". ° ° °RANGE OF MOTION AND STRENGTHENING EXERCISES  °Rehabilitation of the knee is important following a knee injury or an operation. After just a few days of immobilization, the muscles of the thigh which control the knee become weakened and shrink (atrophy). Knee exercises are designed to build up the tone and strength of the thigh muscles and to improve knee motion. Often times heat used for twenty to thirty minutes before working out will loosen up your tissues and help with improving the range of motion but do not use heat for the first two weeks following surgery. These exercises can be done on a training (exercise) mat, on the floor, on a table or on a bed. Use what   ever works the best and is most comfortable for you Knee exercises include:  °Leg Lifts - While your knee is still immobilized in a splint or cast, you can do straight leg raises. Lift the leg to 60 degrees, hold for 3 sec, and slowly lower the leg. Repeat 10-20 times 2-3 times daily. Perform this exercise against resistance later as your knee gets better.  °Quad and Hamstring Sets - Tighten up the muscle on the front of the thigh (Quad) and hold for 5-10 sec. Repeat this 10-20 times hourly. Hamstring sets are done by pushing the foot backward against an object and holding for 5-10 sec. Repeat as with quad sets.  °· Leg Slides: Lying on your back, slowly slide your foot toward your buttocks, bending your knee up off the floor (only go as far as is comfortable). Then slowly slide your foot back down until your leg is flat on the floor again. °· Angel Wings: Lying on your back spread your legs to the side as far apart as you can without causing discomfort.  °A rehabilitation program following serious knee injuries can speed recovery and prevent re-injury in the future due to weakened muscles. Contact your doctor or a  physical therapist for more information on knee rehabilitation.  ° °IF YOU ARE TRANSFERRED TO A SKILLED REHAB FACILITY °If the patient is transferred to a skilled rehab facility following release from the hospital, a list of the current medications will be sent to the facility for the patient to continue.  When discharged from the skilled rehab facility, please have the facility set up the patient's Home Health Physical Therapy prior to being released. Also, the skilled facility will be responsible for providing the patient with their medications at time of release from the facility to include their pain medication, the muscle relaxants, and their blood thinner medication. If the patient is still at the rehab facility at time of the two week follow up appointment, the skilled rehab facility will also need to assist the patient in arranging follow up appointment in our office and any transportation needs. ° °MAKE SURE YOU:  °Understand these instructions.  °Get help right away if you are not doing well or get worse.  ° ° ° ° °

## 2016-02-10 NOTE — Progress Notes (Signed)
Patient requesting to use bedpan, educated patient about the importance of using BSC. Patient informed this RN that she's been using the bedpan all day. Strongly encouraged patient to use BSC. Patient for up with assist x2 and [place].

## 2016-02-11 DIAGNOSIS — M1711 Unilateral primary osteoarthritis, right knee: Secondary | ICD-10-CM | POA: Diagnosis not present

## 2016-02-11 DIAGNOSIS — I2510 Atherosclerotic heart disease of native coronary artery without angina pectoris: Secondary | ICD-10-CM | POA: Diagnosis not present

## 2016-02-11 DIAGNOSIS — F329 Major depressive disorder, single episode, unspecified: Secondary | ICD-10-CM | POA: Diagnosis not present

## 2016-02-11 DIAGNOSIS — M25561 Pain in right knee: Secondary | ICD-10-CM | POA: Diagnosis not present

## 2016-02-11 DIAGNOSIS — E669 Obesity, unspecified: Secondary | ICD-10-CM | POA: Diagnosis not present

## 2016-02-11 DIAGNOSIS — Z5189 Encounter for other specified aftercare: Secondary | ICD-10-CM | POA: Diagnosis not present

## 2016-02-11 DIAGNOSIS — R2689 Other abnormalities of gait and mobility: Secondary | ICD-10-CM | POA: Diagnosis not present

## 2016-02-11 DIAGNOSIS — I10 Essential (primary) hypertension: Secondary | ICD-10-CM | POA: Diagnosis not present

## 2016-02-11 DIAGNOSIS — I1310 Hypertensive heart and chronic kidney disease without heart failure, with stage 1 through stage 4 chronic kidney disease, or unspecified chronic kidney disease: Secondary | ICD-10-CM | POA: Diagnosis not present

## 2016-02-11 DIAGNOSIS — F3289 Other specified depressive episodes: Secondary | ICD-10-CM | POA: Diagnosis not present

## 2016-02-11 DIAGNOSIS — D62 Acute posthemorrhagic anemia: Secondary | ICD-10-CM | POA: Diagnosis not present

## 2016-02-11 DIAGNOSIS — J9610 Chronic respiratory failure, unspecified whether with hypoxia or hypercapnia: Secondary | ICD-10-CM | POA: Diagnosis not present

## 2016-02-11 DIAGNOSIS — N179 Acute kidney failure, unspecified: Secondary | ICD-10-CM | POA: Diagnosis not present

## 2016-02-11 DIAGNOSIS — Z9989 Dependence on other enabling machines and devices: Secondary | ICD-10-CM | POA: Diagnosis not present

## 2016-02-11 DIAGNOSIS — Z96651 Presence of right artificial knee joint: Secondary | ICD-10-CM | POA: Diagnosis not present

## 2016-02-11 DIAGNOSIS — K219 Gastro-esophageal reflux disease without esophagitis: Secondary | ICD-10-CM | POA: Diagnosis not present

## 2016-02-11 DIAGNOSIS — J449 Chronic obstructive pulmonary disease, unspecified: Secondary | ICD-10-CM | POA: Diagnosis not present

## 2016-02-11 DIAGNOSIS — M6281 Muscle weakness (generalized): Secondary | ICD-10-CM | POA: Diagnosis not present

## 2016-02-11 DIAGNOSIS — Z6841 Body Mass Index (BMI) 40.0 and over, adult: Secondary | ICD-10-CM | POA: Diagnosis not present

## 2016-02-11 LAB — URINALYSIS, COMPLETE (UACMP) WITH MICROSCOPIC
BILIRUBIN URINE: NEGATIVE
GLUCOSE, UA: NEGATIVE mg/dL
Ketones, ur: NEGATIVE mg/dL
NITRITE: NEGATIVE
PH: 6 (ref 5.0–8.0)
Protein, ur: 30 mg/dL — AB
SPECIFIC GRAVITY, URINE: 1.012 (ref 1.005–1.030)

## 2016-02-11 LAB — BASIC METABOLIC PANEL
Anion gap: 4 — ABNORMAL LOW (ref 5–15)
BUN: 30 mg/dL — AB (ref 6–20)
CALCIUM: 8.4 mg/dL — AB (ref 8.9–10.3)
CO2: 27 mmol/L (ref 22–32)
CREATININE: 1.47 mg/dL — AB (ref 0.44–1.00)
Chloride: 108 mmol/L (ref 101–111)
GFR calc non Af Amer: 34 mL/min — ABNORMAL LOW (ref >60)
GFR, EST AFRICAN AMERICAN: 39 mL/min — AB (ref >60)
Glucose, Bld: 109 mg/dL — ABNORMAL HIGH (ref 65–99)
Potassium: 4 mmol/L (ref 3.5–5.1)
Sodium: 139 mmol/L (ref 135–145)

## 2016-02-11 LAB — CREATININE, URINE, RANDOM: Creatinine, Urine: 98 mg/dL

## 2016-02-11 LAB — SODIUM, URINE, RANDOM: Sodium, Ur: 41 mmol/L

## 2016-02-11 NOTE — Progress Notes (Signed)
   Subjective: 3 Days Post-Op Procedure(s) (LRB): TOTAL KNEE ARTHROPLASTY (Right) Patient reports pain as 2 on 0-10 scale.   Patient appears to be resting much better today. Patient is actually cutting up and joking with me this morning. Patient  Was seen yesterday by the hospitalist secondary to slightly elevated creatinine above her baseline of 1.6. IV fluids were started. Creatinine today is 1.47. We will start therapy today.  Plan is to go Rehab after hospital stay. no nausea and no vomiting Patient denies any chest pains or shortness of breath. Objective: Vital signs in last 24 hours: Temp:  [98.3 F (36.8 C)-99.1 F (37.3 C)] 99.1 F (37.3 C) (12/29 0605) Pulse Rate:  [80-100] 85 (12/29 0605) Resp:  [12-19] 19 (12/29 0605) BP: (122-146)/(57-75) 146/75 (12/29 0605) SpO2:  [93 %-98 %] 93 % (12/29 0605) well approximated incision Heels are non tender and elevated off the bed using rolled towels Intake/Output from previous day: 12/28 0701 - 12/29 0700 In: 2090 [P.O.:520; I.V.:1570] Out: 800 [Urine:800] Intake/Output this shift: Total I/O In: 1570 [I.V.:1570] Out: 500 [Urine:500]   Recent Labs  02/08/16 0919 02/09/16 0430 02/10/16 0513  HGB 12.9 11.6* 10.4*    Recent Labs  02/09/16 0430 02/10/16 0513  WBC 8.5 9.5  RBC 3.85 3.34*  HCT 36.1 31.3*  PLT 227 205    Recent Labs  02/10/16 0513 02/11/16 0504  NA 133* 139  K 5.0 4.0  CL 104 108  CO2 22 27  BUN 36* 30*  CREATININE 2.15* 1.47*  GLUCOSE 104* 109*  CALCIUM 8.4* 8.4*   No results for input(s): LABPT, INR in the last 72 hours.  EXAM General - Patient is Alert, Appropriate and Oriented Extremity - Neurologically intact Neurovascular intact Sensation intact distally Intact pulses distally Dorsiflexion/Plantar flexion intact No cellulitis present Compartment soft Dressing - Wound VAC in place Motor Function - intact, moving foot and toes well on exam.    Past Medical History:  Diagnosis  Date  . Angioedema   . Anxiety   . Anxiety and depression   . Chronic constipation   . Chronic kidney disease    stage 3  . COPD (chronic obstructive pulmonary disease) ([place])   . Gallstones   . GERD (gastroesophageal reflux disease)   . Hypertension   . Left ventricular hypertrophy   . Osteoarthritis   . Prurigo nodularis   . Tobacco abuse   . Vitamin D deficiency disease     Assessment/Plan: 3 Days Post-Op Procedure(s) (LRB): TOTAL KNEE ARTHROPLASTY (Right) Active Problems:   Primary localized osteoarthritis of right knee  Estimated body mass index is 47.99 kg/m as calculated from the following:   Height as of 02/02/16: 5\' 1"  (1.549 m).   Weight as of 02/02/16: 115.2 kg (254 lb). Up with therapy Discharge to SNF when medically cleared  Labs: Were reviewed. Creatinine has improved DVT Prophylaxis - Lovenox, Foot Pumps and TED hose Weight-Bearing as tolerated to right leg   Jon R. [place] 02/11/2016, 6:18 AM

## 2016-02-11 NOTE — Progress Notes (Signed)
Physical Therapy Treatment Patient Details Name: Katherine [name] MRN: [id number] DOB: February 09, 1941 Today's Date: 02/11/2016    History of Present Illness Pt. is a 75 y.o. female who was admitted to [facility] for a right TKR secondary to R knee OA.    PT Comments    Pt presents with deficits in strength, transfers, mobility, gait, R knee ROM, balance, and activity tolerance.  Pt progressing slowly towards goals and continues to require Min A for sup to/from sit secondary to RLE weakness; pt remains unable to perform RLE SLR and presents with significant weakness with RLE knee ext/flex that may be limited by pain.  Pt able to amb 1 x 6' and 2 x 2-3' with RW and CGA with much effort.  Pt will benefit from PT services to address above deficits for decreased caregiver assistance upon discharge.    Follow Up Recommendations  SNF     Equipment Recommendations  None recommended by PT    Recommendations for Other Services       Precautions / Restrictions Precautions Precautions: Fall;Knee Precaution Booklet Issued: Yes (comment) Required Braces or Orthoses: Knee Immobilizer - Right Knee Immobilizer - Right: On when out of bed or walking Restrictions Weight Bearing Restrictions: Yes RLE Weight Bearing: Weight bearing as tolerated    Mobility  Bed Mobility Overal bed mobility: Needs Assistance Bed Mobility: Supine to Sit;Sit to Supine     Supine to sit: Min assist Sit to supine: Min assist   General bed mobility comments: Pt continues to require assist to get RLE in/out of bed and is unable to perform a RLE SLR  Transfers Overall transfer level: Needs assistance Equipment used: Rolling walker (2 wheeled) Transfers: Sit to/from Stand Sit to Stand: Min guard         General transfer comment: Mod verbal cues for sequencing with KI donned to RLE  Ambulation/Gait Ambulation/Gait assistance: Min guard Ambulation Distance (Feet): 6 Feet Assistive device: Rolling walker (2  wheeled) Gait Pattern/deviations: Antalgic;Step-to pattern;Trunk flexed   Gait velocity interpretation: Below normal speed for age/gender General Gait Details: KI donned to RLE with amb; Pt able to Amb 1 x 6' and 2 x 2-3'; Amb remains effortful with poor activity tolerance.  SpO2 remained at 98% after amb with HR increasing from 84 to 92 bpm.   Stairs            Wheelchair Mobility    Modified Rankin (Stroke Patients Only)       Balance Overall balance assessment: Needs assistance Sitting-balance support: No upper extremity supported Sitting balance-Leahy Scale: Good     Standing balance support: Bilateral upper extremity supported Standing balance-Leahy Scale: Fair                      Cognition Arousal/Alertness: Awake/alert Behavior During Therapy: WFL for tasks assessed/performed Overall Cognitive Status: Within Functional Limits for tasks assessed                      Exercises Total Joint Exercises Ankle Circles/Pumps: Strengthening;Both;10 reps;15 reps Quad Sets: AROM;Right;10 reps;15 reps Gluteal Sets: AROM;Both;10 reps Heel Slides: AAROM;Right;10 reps;15 reps Hip ABduction/ADduction: AAROM;Right;10 reps Straight Leg Raises: AAROM;Right;10 reps Long Arc Quad: AAROM;Right;10 reps;15 reps Knee Flexion: AAROM;PROM;Right;10 reps;15 reps Goniometric ROM: R knee A/AAROM: flex 70/82 deg; ext 0 deg    General Comments        Pertinent Vitals/Pain Pain Assessment: No/denies pain    Home Living  Prior Function            PT Goals (current goals can now be found in the care plan section) Progress towards PT goals: Progressing toward goals    Frequency    BID      PT Plan Current plan remains appropriate    Co-evaluation             End of Session Equipment Utilized During Treatment: Gait belt;Oxygen Activity Tolerance: Patient limited by fatigue Patient left: in bed;with bed alarm set;with  SCD's reapplied;with call bell/phone within reach;with family/visitor present (Polar care donned to R knee, heels floated)     Time: 5072-2575 PT Time Calculation (min) (ACUTE ONLY): 48 min  Charges:  $Gait Training: 8-22 mins $Therapeutic Exercise: 8-22 mins $Therapeutic Activity: 8-22 mins                    G Codes:      DRoyetta Asal PT, DPT 02/11/16, 12:31 PM

## 2016-02-11 NOTE — Progress Notes (Signed)
[facility] [place] NAME: Katherine [name]    MRN#:  [id number]  DATE OF BIRTH:  [birth year]  SUBJECTIVE:  Hospital Day: 3 days Katherine [name] is a 75 y.o. female presenting with No chief complaint on file. .   Overnight events: no overnight events Interval Events: no complaints  REVIEW OF SYSTEMS:  CONSTITUTIONAL: No fever, fatigue or weakness.  EYES: No blurred or double vision.  EARS, NOSE, AND THROAT: No tinnitus or ear pain.  RESPIRATORY: No cough, shortness of breath, wheezing or hemoptysis.  CARDIOVASCULAR: No chest pain, orthopnea, edema.  GASTROINTESTINAL: No nausea, vomiting, diarrhea or abdominal pain.  GENITOURINARY: No dysuria, hematuria.  ENDOCRINE: No polyuria, nocturia,  HEMATOLOGY: No anemia, easy bruising or bleeding SKIN: No rash or lesion. MUSCULOSKELETAL: No joint pain or arthritis.   NEUROLOGIC: No tingling, numbness, weakness.  PSYCHIATRY: No anxiety or depression.   DRUG ALLERGIES:   Allergies  Allergen Reactions  . Bee Venom Swelling  . Enalapril Maleate Swelling  . Other     Bee stings    VITALS:  Blood pressure 122/68, pulse 85, temperature 98.5 F (36.9 C), temperature source Oral, resp. rate 17, SpO2 97 %.  PHYSICAL EXAMINATION:  VITAL SIGNS: Vitals:   02/11/16 0605 02/11/16 0818  BP: (!) 146/75 122/68  Pulse: 85 85  Resp: 19 17  Temp: 99.1 F (37.3 C) 98.5 F (36.9 C)   GENERAL:75 y.o.female currently in no acute distress.  HEAD: Normocephalic, atraumatic.  EYES: Pupils equal, round, reactive to light. Extraocular muscles intact. No scleral icterus.  MOUTH: Moist mucosal membrane. Dentition intact. No abscess noted.  EAR, NOSE, THROAT: Clear without exudates. No external lesions.  NECK: Supple. No thyromegaly. No nodules. No JVD.  PULMONARY: Clear to ascultation, without wheeze rails or rhonci. No use of accessory muscles, Good respiratory effort. good air entry  bilaterally CHEST: Nontender to palpation.  CARDIOVASCULAR: S1 and S2. Regular rate and rhythm. No murmurs, rubs, or gallops. No edema. Pedal pulses 2+ bilaterally.  GASTROINTESTINAL: Soft, nontender, nondistended. No masses. Positive bowel sounds. No hepatosplenomegaly.  MUSCULOSKELETAL: No swelling, clubbing, or edema. Range of motion full in all extremities.  NEUROLOGIC: Cranial nerves II through XII are intact. No gross focal neurological deficits. Sensation intact. Reflexes intact.  SKIN: No ulceration, lesions, rashes, or cyanosis. Skin warm and dry. Turgor intact.  PSYCHIATRIC: Mood, affect within normal limits. The patient is awake, alert and oriented x 3. Insight, judgment intact.      LABORATORY PANEL:   CBC  Recent Labs Lab 02/10/16 0513  WBC 9.5  HGB 10.4*  HCT 31.3*  PLT 205   ------------------------------------------------------------------------------------------------------------------  Chemistries   Recent Labs Lab 02/11/16 0504  NA 139  K 4.0  CL 108  CO2 27  GLUCOSE 109*  BUN 30*  CREATININE 1.47*  CALCIUM 8.4*   ------------------------------------------------------------------------------------------------------------------  Cardiac Enzymes No results for input(s): TROPONINI in the last 168 hours. ------------------------------------------------------------------------------------------------------------------  RADIOLOGY:  No results found.  EKG:   Orders placed or performed during the hospital encounter of 02/02/16  . EKG 12-Lead  . EKG 12-Lead    ASSESSMENT AND PLAN:   Katherine [name] is a 75 y.o. female presenting with No chief complaint on file. . Admitted 02/08/2016 : Day #: 3 days  1. Acute kidney injury over CKD stage III: back to baseline  2 Right total knee arthroplasty Postop day 3  3 COPD with chronic respiratory failure is stable  4 Postop acute blood loss  anemia. No need for transfusion at this time.    OK  for DC from medicine standpoint  All the records are reviewed and case discussed with Care Management/Social Workerr. Management plans discussed with the patient, family and they are in agreement.  CODE STATUS: full TOTAL TIME TAKING CARE OF THIS PATIENT: 28 minutes.   POSSIBLE D/C IN 1DAYS, DEPENDING ON CLINICAL CONDITION.   Hower,  Karenann Cai.D on 02/11/2016 at 12:00 PM  Between 7am to 6pm - Pager - [phone]  After 6pm: House Pager: - [phone]  [facility]  Office  [phone]  CC: Primary care physician; [name], NP

## 2016-02-11 NOTE — Progress Notes (Signed)
Report called to Yaakov Guthrie, LPN at [facility] [phone]). Patient being transferred to room 806. Per Ms. Ishmael Holter, Patient's room is ready. Non-emergent EMS transport to be called to transport patient to facility.

## 2016-02-11 NOTE — Progress Notes (Signed)
Patient is medically stable for D/C to [facility] today. Per [facility] liaison patient will go to room 806. RN will call report to RN Yaakov Guthrie at [phone] and arrange EMS for transport. Per [place] case manager authorization is updated for today. Auth # [id number]. Clinical Education officer, museum (CSW) sent D/C orders to [employer] via [facility] today. Patient is aware of above. CSW contacted patient's husband Bennie Hind and made him aware of above. Please reconsult if future social work needs arise. CSW signing off.   [employer], LCSW [phone]

## 2016-02-11 NOTE — Clinical Social Work Placement (Signed)
   CLINICAL SOCIAL WORK PLACEMENT  NOTE  Date:  02/11/2016  Patient Details  Name: Katherine [name] MRN: [id number] Date of Birth: 1940-03-16  Clinical Social Work is seeking post-discharge placement for this patient at the [place] level of care (*CSW will initial, date and re-position this form in  chart as items are completed):  Yes   Patient/family provided with [place] Work Department's list of facilities offering this level of care within the geographic area requested by the patient (or if unable, by the patient's family).  Yes   Patient/family informed of their freedom to choose among providers that offer the needed level of care, that participate in Medicare, Medicaid or managed care program needed by the patient, have an available bed and are willing to accept the patient.  Yes   Patient/family informed of [facility]'s ownership interest in [facility] and [facility], as well as of the fact that they are under no obligation to receive care at these facilities.  PASRR submitted to EDS on 02/09/16     PASRR number received on 02/09/16     Existing PASRR number confirmed on       FL2 transmitted to all facilities in geographic area requested by pt/family on 02/09/16     FL2 transmitted to all facilities within larger geographic area on       Patient informed that his/her managed care company has contracts with or will negotiate with certain facilities, including the following:        Yes   Patient/family informed of bed offers received.  Patient chooses bed at  ([facility] )     Physician recommends and patient chooses bed at      Patient to be transferred to  ([facility] ) on 02/11/16.  Patient to be transferred to facility by  [facility] )     Patient family notified on 02/11/16 of transfer.  Name of family member notified:   (Patient's husband Katherine [name] is aware of D/C today. )     PHYSICIAN       Additional  Comment:    _______________________________________________ Gualberto Wahlen, Veronia Beets, LCSW 02/11/2016, 12:34 PM

## 2016-02-11 NOTE — Progress Notes (Signed)
Report given to EMS- at bedside to transport to patient to [facility]. Belongings sent with patient. IV dc'ed prior to transport.

## 2016-02-14 DIAGNOSIS — J449 Chronic obstructive pulmonary disease, unspecified: Secondary | ICD-10-CM | POA: Diagnosis not present

## 2016-02-14 DIAGNOSIS — E669 Obesity, unspecified: Secondary | ICD-10-CM | POA: Diagnosis not present

## 2016-02-14 DIAGNOSIS — K219 Gastro-esophageal reflux disease without esophagitis: Secondary | ICD-10-CM | POA: Diagnosis not present

## 2016-02-14 DIAGNOSIS — I10 Essential (primary) hypertension: Secondary | ICD-10-CM | POA: Diagnosis not present

## 2016-02-14 DIAGNOSIS — Z96651 Presence of right artificial knee joint: Secondary | ICD-10-CM | POA: Diagnosis not present

## 2016-02-15 ENCOUNTER — Other Ambulatory Visit: Payer: Self-pay

## 2016-02-15 NOTE — Discharge Instructions (Signed)
Cataract Surgery, Care After °Refer to this sheet in the next few weeks. These instructions provide you with information about caring for yourself after your procedure. Your health care provider may also give you more specific instructions. Your treatment has been planned according to current medical practices, but problems sometimes occur. Call your health care provider if you have any problems or questions after your procedure. °What can I expect after the procedure? °After the procedure, it is common to have: °· Itching. °· Discomfort. °· Fluid discharge. °· Sensitivity to light and to touch. °· Bruising. °Follow these instructions at home: °Eye Care  °· Check your eye every day for signs of infection. Watch for: °¨ Redness, swelling, or pain. °¨ Fluid, blood, or pus. °¨ Warmth. °¨ Bad smell. °Activity  °· Avoid strenuous activities, such as playing contact sports, for as long as told by your health care provider. °· Do not drive or operate heavy machinery until your health care provider approves. °· Do not bend or lift heavy objects . Bending increases pressure in the eye. You can walk, climb stairs, and do light household chores. °· Ask your health care provider when you can return to work. If you work in a dusty environment, you may be advised to wear protective eyewear for a period of time. °General instructions  °· Take or apply over-the-counter and prescription medicines only as told by your health care provider. This includes eye drops. °· Do not touch or rub your eyes. °· If you were given a protective shield, wear it as told by your health care provider. If you were not given a protective shield, wear sunglasses as told by your health care provider to protect your eyes. °· Keep the area around your eye clean and dry. Avoid swimming or allowing water to hit you directly in the face while showering until told by your health care provider. Keep soap and shampoo out of your eyes. °· Do not put a contact lens  into the affected eye or eyes until your health care provider approves. °· Keep all follow-up visits as told by your health care provider. This is important. °Contact a health care provider if: ° °· You have increased bruising around your eye. °· You have pain that is not helped with medicine. °· You have a fever. °· You have redness, swelling, or pain in your eye. °· You have fluid, blood, or pus coming from your incision. °· Your vision gets worse. °Get help right away if: °· You have sudden vision loss. °This information is not intended to replace advice given to you by your health care provider. Make sure you discuss any questions you have with your health care provider. °Document Released: 08/19/2004 Document Revised: 06/10/2015 Document Reviewed: 12/10/2014 °Elsevier Interactive Patient Education © 2017 Elsevier Inc. ° ° ° ° °General Anesthesia, Adult, Care After °These instructions provide you with information about caring for yourself after your procedure. Your health care provider may also give you more specific instructions. Your treatment has been planned according to current medical practices, but problems sometimes occur. Call your health care provider if you have any problems or questions after your procedure. °What can I expect after the procedure? °After the procedure, it is common to have: °· Vomiting. °· A sore throat. °· Mental slowness. °It is common to feel: °· Nauseous. °· Cold or shivery. °· Sleepy. °· Tired. °· Sore or achy, even in parts of your body where you did not have surgery. °Follow these instructions at   home: °For at least 24 hours after the procedure:  °· Do not: °¨ Participate in activities where you could fall or become injured. °¨ Drive. °¨ Use heavy machinery. °¨ Drink alcohol. °¨ Take sleeping pills or medicines that cause drowsiness. °¨ Make important decisions or sign legal documents. °¨ Take care of children on your own. °· Rest. °Eating and drinking  °· If you vomit, drink  water, juice, or soup when you can drink without vomiting. °· Drink enough fluid to keep your urine clear or pale yellow. °· Make sure you have little or no nausea before eating solid foods. °· Follow the diet recommended by your health care provider. °General instructions  °· Have a responsible adult stay with you until you are awake and alert. °· Return to your normal activities as told by your health care provider. Ask your health care provider what activities are safe for you. °· Take over-the-counter and prescription medicines only as told by your health care provider. °· If you smoke, do not smoke without supervision. °· Keep all follow-up visits as told by your health care provider. This is important. °Contact a health care provider if: °· You continue to have nausea or vomiting at home, and medicines are not helpful. °· You cannot drink fluids or start eating again. °· You cannot urinate after 8-12 hours. °· You develop a skin rash. °· You have fever. °· You have increasing redness at the site of your procedure. °Get help right away if: °· You have difficulty breathing. °· You have chest pain. °· You have unexpected bleeding. °· You feel that you are having a life-threatening or urgent problem. °This information is not intended to replace advice given to you by your health care provider. Make sure you discuss any questions you have with your health care provider. °Document Released: 05/08/2000 Document Revised: 07/05/2015 Document Reviewed: 01/14/2015 °Elsevier Interactive Patient Education © 2017 Elsevier Inc. ° °

## 2016-02-15 NOTE — Patient Outreach (Signed)
[facility]) Care Management  02/15/2016  Katherine [name] 12/05/1940 [id number]     EMMI-General RED ON EMMI ALERT Day # 1 Date: 02/14/16 Red Alert Reason: "Got discharge papers/ No" "Know who to call about changes in condition? No" "Scheduled follow up appt? No" "New prescriptions? I don't Know"   Outreach attempt #1 to patient. No answer at present. RN CM left HIPAA compliant voicemail message along with contact info.     Plan: RN CM will make outreach attempt to patient within one business day if no return call from patient.   Enzo Montgomery, RN,BSN,CCM [place] Management Telephonic Care Management Coordinator Direct Phone: [phone] Toll Free: [phone] Fax: [phone]

## 2016-02-16 ENCOUNTER — Other Ambulatory Visit: Payer: Self-pay

## 2016-02-16 NOTE — Patient Outreach (Signed)
[facility]) Care Management  02/16/2016  Katherine [name] 12/13/1940 [id number]     EMMI-General RED ON EMMI ALERT Day # 1 Date: 02/14/16 Red Alert Reason: "Got discharge papers?No" "Know who to call about changes in condition? No" "Scheduled follow up appt? No" "New prescriptions? I don't Know"   Outreach attempt #2 to patient. No answer at both numbers listed. RN CM left HIPAA compliant voicemail along with contact info.    Plan: RN CM will send unsuccessful outreach letter to patient and close case if no response from patient within 10 business days.   Enzo Montgomery, RN,BSN,CCM [place] Management Telephonic Care Management Coordinator Direct Phone: [phone] Toll Free: [phone] Fax: [phone]

## 2016-02-17 ENCOUNTER — Other Ambulatory Visit: Payer: Self-pay | Admitting: *Deleted

## 2016-02-17 NOTE — Patient Outreach (Signed)
[facility] [facility]) Care Management  02/17/2016  Katherine [name] 1941/01/25 [id number]   Spoke with patient at facility. Patient states she does not know when she will be discharging, she is able to progress with therapy. RNCM discussed [facility] care management and transition of care calls. Patient agrees to calls upon discharge.  [name] that patient will be active with [facility] upon discharge and to verify discharge date.  Plan: Will revisit patient next week  Will let [facility] telephonic RNCM know that patient is in facility. Will follow  Royetta Crochet. Laymond Purser, RN, BSN, [place] [facility] Care Coordinator [phone]

## 2016-02-18 ENCOUNTER — Other Ambulatory Visit: Payer: Self-pay

## 2016-02-18 NOTE — Patient Outreach (Signed)
[place] [facility]) Care Management  02/18/2016  Katherine [name] Apr 28, 1940 [id number]   EMMI-General RED ON EMMI ALERT Day # 1 Date: 02/14/16 Red Alert Reason: "Got discharge papers?No" "Know who to call about changes in condition? No" "Scheduled follow up appt? No" "New prescriptions? I don't Know"   RN CM notified by [facility] coordinator that patient was discharged from hospital and went to [facility] facility. RN CM will have EMMI calls deactivated as patient remains inpatient at a facility.    Plan: RN CM will notify [facility] administrative assistant of case closure status and to deactivate EMMI calls.    Enzo Montgomery, RN,BSN,CCM [place] Management Telephonic Care Management Coordinator Direct Phone: [phone] Toll Free: [phone] Fax: [phone]

## 2016-02-21 DIAGNOSIS — J449 Chronic obstructive pulmonary disease, unspecified: Secondary | ICD-10-CM | POA: Diagnosis not present

## 2016-02-21 DIAGNOSIS — K219 Gastro-esophageal reflux disease without esophagitis: Secondary | ICD-10-CM | POA: Diagnosis not present

## 2016-02-21 DIAGNOSIS — Z96651 Presence of right artificial knee joint: Secondary | ICD-10-CM | POA: Diagnosis not present

## 2016-02-21 DIAGNOSIS — I10 Essential (primary) hypertension: Secondary | ICD-10-CM | POA: Diagnosis not present

## 2016-02-21 DIAGNOSIS — F329 Major depressive disorder, single episode, unspecified: Secondary | ICD-10-CM | POA: Diagnosis not present

## 2016-02-23 ENCOUNTER — Ambulatory Visit: Admit: 2016-02-23 | Payer: Commercial Managed Care - HMO | Admitting: Ophthalmology

## 2016-02-23 SURGERY — EXTRACTION, CATARACT, WITH IOL INSERTION
Anesthesia: Topical | Laterality: Left

## 2016-02-24 ENCOUNTER — Telehealth: Payer: Self-pay | Admitting: Unknown Physician Specialty

## 2016-02-24 NOTE — Telephone Encounter (Signed)
Shawn with [facility] called to schedule Hospital FU for Katherine [name].  LMOM requesting Shawn to return my call to advise that patient is already scheduled for a FU appt on 03/13/16 @ 10:30am.

## 2016-02-25 ENCOUNTER — Other Ambulatory Visit: Payer: Self-pay | Admitting: *Deleted

## 2016-02-25 DIAGNOSIS — J441 Chronic obstructive pulmonary disease with (acute) exacerbation: Secondary | ICD-10-CM

## 2016-02-25 NOTE — Patient Outreach (Signed)
[facility]) Care Management  02/25/2016  Katherine [name] [birth year] [id number]  Notified by Dimple Nanas, SW at facility, patient doing well and will be discharged home on 03/01/16.  Plan to order EMMI calls to resume upon discharge home. Will sign off  Shifa Brisbon E. Laymond Purser, RN, BSN, [place] [facility] Care Coordinator [phone]

## 2016-02-28 ENCOUNTER — Encounter: Payer: Medicare HMO | Admitting: Unknown Physician Specialty

## 2016-03-01 ENCOUNTER — Other Ambulatory Visit: Payer: Self-pay

## 2016-03-01 NOTE — Patient Outreach (Signed)
[facility]) Care Management  03/01/2016  Katherine [name] 12-Mar-1940 [id number]       EMMI- RED ON EMMI ALERT Day # 1 Date: 02/29/16 Red Alert Reason: "Have a way to get to follow up appt? No"     Outreach attempt # 1 to patient. Spoke with patient. Reviewed and discussed red alert. Patient states that she does have transportation to MD appt. She reports that her family will be taking her. Confirmed with patient that she has PCP f/u appt for 03/13/16 at 10:30am/ She voiced understanding. patient states she has been doing fine since return home. Denies any issues with meds. She voices no RN CM needs or concerns at this time.      Plan: RN CM will notify [facility] administrative assistant of case status.   Enzo Montgomery, RN,BSN,CCM [place] Management Telephonic Care Management Coordinator Direct Phone: [phone] Toll Free: [phone] Fax: [phone]

## 2016-03-04 DIAGNOSIS — N183 Chronic kidney disease, stage 3 (moderate): Secondary | ICD-10-CM | POA: Diagnosis not present

## 2016-03-04 DIAGNOSIS — F3289 Other specified depressive episodes: Secondary | ICD-10-CM | POA: Diagnosis not present

## 2016-03-04 DIAGNOSIS — J449 Chronic obstructive pulmonary disease, unspecified: Secondary | ICD-10-CM | POA: Diagnosis not present

## 2016-03-04 DIAGNOSIS — F418 Other specified anxiety disorders: Secondary | ICD-10-CM | POA: Diagnosis not present

## 2016-03-04 DIAGNOSIS — I129 Hypertensive chronic kidney disease with stage 1 through stage 4 chronic kidney disease, or unspecified chronic kidney disease: Secondary | ICD-10-CM | POA: Diagnosis not present

## 2016-03-04 DIAGNOSIS — Z471 Aftercare following joint replacement surgery: Secondary | ICD-10-CM | POA: Diagnosis not present

## 2016-03-04 DIAGNOSIS — M1712 Unilateral primary osteoarthritis, left knee: Secondary | ICD-10-CM | POA: Diagnosis not present

## 2016-03-04 DIAGNOSIS — I2510 Atherosclerotic heart disease of native coronary artery without angina pectoris: Secondary | ICD-10-CM | POA: Diagnosis not present

## 2016-03-06 ENCOUNTER — Telehealth: Payer: Self-pay | Admitting: Unknown Physician Specialty

## 2016-03-06 DIAGNOSIS — J449 Chronic obstructive pulmonary disease, unspecified: Secondary | ICD-10-CM | POA: Diagnosis not present

## 2016-03-06 NOTE — Telephone Encounter (Signed)
Routing to provider  

## 2016-03-06 NOTE — Telephone Encounter (Signed)
I don't think I can do that since I didn't evaluate her.  I think it would be from Orthopedics

## 2016-03-07 DIAGNOSIS — I2510 Atherosclerotic heart disease of native coronary artery without angina pectoris: Secondary | ICD-10-CM | POA: Diagnosis not present

## 2016-03-07 DIAGNOSIS — M1712 Unilateral primary osteoarthritis, left knee: Secondary | ICD-10-CM | POA: Diagnosis not present

## 2016-03-07 DIAGNOSIS — I129 Hypertensive chronic kidney disease with stage 1 through stage 4 chronic kidney disease, or unspecified chronic kidney disease: Secondary | ICD-10-CM | POA: Diagnosis not present

## 2016-03-07 DIAGNOSIS — Z471 Aftercare following joint replacement surgery: Secondary | ICD-10-CM | POA: Diagnosis not present

## 2016-03-07 DIAGNOSIS — F3289 Other specified depressive episodes: Secondary | ICD-10-CM | POA: Diagnosis not present

## 2016-03-07 DIAGNOSIS — J449 Chronic obstructive pulmonary disease, unspecified: Secondary | ICD-10-CM | POA: Diagnosis not present

## 2016-03-07 DIAGNOSIS — N183 Chronic kidney disease, stage 3 (moderate): Secondary | ICD-10-CM | POA: Diagnosis not present

## 2016-03-07 DIAGNOSIS — F418 Other specified anxiety disorders: Secondary | ICD-10-CM | POA: Diagnosis not present

## 2016-03-07 NOTE — Telephone Encounter (Signed)
Called and left Anderson Malta a message letting her know what Malachy Mood said. I asked for her to please call me back with any questions or concerns.

## 2016-03-08 ENCOUNTER — Telehealth: Payer: Self-pay

## 2016-03-08 DIAGNOSIS — I129 Hypertensive chronic kidney disease with stage 1 through stage 4 chronic kidney disease, or unspecified chronic kidney disease: Secondary | ICD-10-CM | POA: Diagnosis not present

## 2016-03-08 DIAGNOSIS — N183 Chronic kidney disease, stage 3 (moderate): Secondary | ICD-10-CM | POA: Diagnosis not present

## 2016-03-08 DIAGNOSIS — F418 Other specified anxiety disorders: Secondary | ICD-10-CM | POA: Diagnosis not present

## 2016-03-08 DIAGNOSIS — Z471 Aftercare following joint replacement surgery: Secondary | ICD-10-CM | POA: Diagnosis not present

## 2016-03-08 DIAGNOSIS — I2510 Atherosclerotic heart disease of native coronary artery without angina pectoris: Secondary | ICD-10-CM | POA: Diagnosis not present

## 2016-03-08 DIAGNOSIS — F3289 Other specified depressive episodes: Secondary | ICD-10-CM | POA: Diagnosis not present

## 2016-03-08 DIAGNOSIS — M1712 Unilateral primary osteoarthritis, left knee: Secondary | ICD-10-CM | POA: Diagnosis not present

## 2016-03-08 DIAGNOSIS — J449 Chronic obstructive pulmonary disease, unspecified: Secondary | ICD-10-CM | POA: Diagnosis not present

## 2016-03-08 DIAGNOSIS — I10 Essential (primary) hypertension: Secondary | ICD-10-CM | POA: Diagnosis not present

## 2016-03-08 NOTE — Telephone Encounter (Signed)
Verbal given 

## 2016-03-08 NOTE — Telephone Encounter (Signed)
Called and let Robin with [facility] know that Malachy Mood gave the verbal OK for orders.

## 2016-03-08 NOTE — Telephone Encounter (Signed)
Robin from [facility] called and would like to get verbal orders for OT for 2 times a week for 3 weeks for upper body strength and deconditioning.

## 2016-03-09 DIAGNOSIS — J449 Chronic obstructive pulmonary disease, unspecified: Secondary | ICD-10-CM | POA: Diagnosis not present

## 2016-03-09 DIAGNOSIS — Z471 Aftercare following joint replacement surgery: Secondary | ICD-10-CM | POA: Diagnosis not present

## 2016-03-09 DIAGNOSIS — F418 Other specified anxiety disorders: Secondary | ICD-10-CM | POA: Diagnosis not present

## 2016-03-09 DIAGNOSIS — M1712 Unilateral primary osteoarthritis, left knee: Secondary | ICD-10-CM | POA: Diagnosis not present

## 2016-03-09 DIAGNOSIS — N183 Chronic kidney disease, stage 3 (moderate): Secondary | ICD-10-CM | POA: Diagnosis not present

## 2016-03-09 DIAGNOSIS — I2510 Atherosclerotic heart disease of native coronary artery without angina pectoris: Secondary | ICD-10-CM | POA: Diagnosis not present

## 2016-03-09 DIAGNOSIS — I129 Hypertensive chronic kidney disease with stage 1 through stage 4 chronic kidney disease, or unspecified chronic kidney disease: Secondary | ICD-10-CM | POA: Diagnosis not present

## 2016-03-09 DIAGNOSIS — F3289 Other specified depressive episodes: Secondary | ICD-10-CM | POA: Diagnosis not present

## 2016-03-10 DIAGNOSIS — N183 Chronic kidney disease, stage 3 (moderate): Secondary | ICD-10-CM | POA: Diagnosis not present

## 2016-03-10 DIAGNOSIS — M1712 Unilateral primary osteoarthritis, left knee: Secondary | ICD-10-CM | POA: Diagnosis not present

## 2016-03-10 DIAGNOSIS — F418 Other specified anxiety disorders: Secondary | ICD-10-CM | POA: Diagnosis not present

## 2016-03-10 DIAGNOSIS — I2510 Atherosclerotic heart disease of native coronary artery without angina pectoris: Secondary | ICD-10-CM | POA: Diagnosis not present

## 2016-03-10 DIAGNOSIS — F3289 Other specified depressive episodes: Secondary | ICD-10-CM | POA: Diagnosis not present

## 2016-03-10 DIAGNOSIS — Z471 Aftercare following joint replacement surgery: Secondary | ICD-10-CM | POA: Diagnosis not present

## 2016-03-10 DIAGNOSIS — J449 Chronic obstructive pulmonary disease, unspecified: Secondary | ICD-10-CM | POA: Diagnosis not present

## 2016-03-10 DIAGNOSIS — I129 Hypertensive chronic kidney disease with stage 1 through stage 4 chronic kidney disease, or unspecified chronic kidney disease: Secondary | ICD-10-CM | POA: Diagnosis not present

## 2016-03-13 ENCOUNTER — Encounter: Payer: Self-pay | Admitting: Unknown Physician Specialty

## 2016-03-13 ENCOUNTER — Ambulatory Visit (INDEPENDENT_AMBULATORY_CARE_PROVIDER_SITE_OTHER): Payer: Medicare HMO | Admitting: Unknown Physician Specialty

## 2016-03-13 ENCOUNTER — Other Ambulatory Visit: Payer: Self-pay

## 2016-03-13 VITALS — BP 109/72 | HR 60 | Temp 98.3°F | Ht 60.6 in | Wt 244.8 lb

## 2016-03-13 DIAGNOSIS — I2510 Atherosclerotic heart disease of native coronary artery without angina pectoris: Secondary | ICD-10-CM | POA: Diagnosis not present

## 2016-03-13 DIAGNOSIS — J42 Unspecified chronic bronchitis: Secondary | ICD-10-CM

## 2016-03-13 DIAGNOSIS — N183 Chronic kidney disease, stage 3 unspecified: Secondary | ICD-10-CM

## 2016-03-13 DIAGNOSIS — J449 Chronic obstructive pulmonary disease, unspecified: Secondary | ICD-10-CM | POA: Diagnosis not present

## 2016-03-13 DIAGNOSIS — J432 Centrilobular emphysema: Secondary | ICD-10-CM | POA: Diagnosis not present

## 2016-03-13 DIAGNOSIS — M1712 Unilateral primary osteoarthritis, left knee: Secondary | ICD-10-CM | POA: Diagnosis not present

## 2016-03-13 DIAGNOSIS — J209 Acute bronchitis, unspecified: Secondary | ICD-10-CM | POA: Diagnosis not present

## 2016-03-13 DIAGNOSIS — M25561 Pain in right knee: Secondary | ICD-10-CM | POA: Diagnosis not present

## 2016-03-13 DIAGNOSIS — F418 Other specified anxiety disorders: Secondary | ICD-10-CM | POA: Diagnosis not present

## 2016-03-13 DIAGNOSIS — Z66 Do not resuscitate: Secondary | ICD-10-CM | POA: Diagnosis not present

## 2016-03-13 DIAGNOSIS — F3289 Other specified depressive episodes: Secondary | ICD-10-CM | POA: Diagnosis not present

## 2016-03-13 DIAGNOSIS — J441 Chronic obstructive pulmonary disease with (acute) exacerbation: Secondary | ICD-10-CM

## 2016-03-13 DIAGNOSIS — I10 Essential (primary) hypertension: Secondary | ICD-10-CM | POA: Diagnosis not present

## 2016-03-13 DIAGNOSIS — Z471 Aftercare following joint replacement surgery: Secondary | ICD-10-CM | POA: Diagnosis not present

## 2016-03-13 DIAGNOSIS — Z7189 Other specified counseling: Secondary | ICD-10-CM

## 2016-03-13 DIAGNOSIS — I129 Hypertensive chronic kidney disease with stage 1 through stage 4 chronic kidney disease, or unspecified chronic kidney disease: Secondary | ICD-10-CM | POA: Diagnosis not present

## 2016-03-13 MED ORDER — DOXYCYCLINE HYCLATE 100 MG PO TABS: 100.0000 mg | ORAL_TABLET | Freq: Two times a day (BID) | ORAL | 0 refills | Status: DC

## 2016-03-13 MED ORDER — ALBUTEROL SULFATE HFA 108 (90 BASE) MCG/ACT IN AERS: 2.0000 | INHALATION_SPRAY | RESPIRATORY_TRACT | 5 refills | Status: DC | PRN

## 2016-03-13 NOTE — Assessment & Plan Note (Signed)
Prescribed doxycycline 100 mg BID for 10 days.

## 2016-03-13 NOTE — Assessment & Plan Note (Addendum)
CBC and COMP drawn today. Patient advised to make an appointment for follow-up with nephrology.

## 2016-03-13 NOTE — Patient Outreach (Signed)
[facility]) Care Management  03/13/2016  Lani Havlik Jun 09, 1940 [id number]     EMMI- COPD RED ON EMMI ALERT Day # 11 Date: 03/10/16 Red Alert Reason: "# of times rescue inhaler used in past 24 hrs? 3"    Outreach attempt # 1 to patient. Spoke with spouse who reported patient was at MD appt.     Plan: RN CM will make outreach attempt to patient within one business day.   Enzo Montgomery, RN,BSN,CCM [place] Management Telephonic Care Management Coordinator Direct Phone: [phone] Toll Free: [phone] Fax: [phone]

## 2016-03-13 NOTE — Assessment & Plan Note (Signed)
Stable. Will continue on current medications.

## 2016-03-13 NOTE — Patient Instructions (Addendum)
Need follow-up appointments with orthopedist (Dr. Rudene Christians) and nephrologist.

## 2016-03-13 NOTE — Progress Notes (Addendum)
BP 109/72 (BP Location: Left Arm, Patient Position: Sitting, Cuff Size: Large)   Pulse 60   Temp 98.3 F (36.8 C)   Ht 5' 0.6" (1.539 m)   Wt 244 lb 12.8 oz (111 kg)   SpO2 91%   BMI 46.87 kg/m    Subjective:    Patient ID: Katherine [name], female    DOB: Jul 09, 1940, 76 y.o.   MRN: [id number]  HPI: Katherine [name] is a 76 y.o. female  Chief Complaint  Patient presents with  . Follow-up    3 month f/up, states her knee is really bothering her   . URI    pt states she has been feeling bad for a month, ever since her knee surgery. states she has had a cough, nasal cogestion, yellow phlegm and chest congestion.   Patient presents for hospital discharge follow-up post total arthroplasty of the right knee.  URI/COPD Exacerbation Patient presents to clinic with upper respiratory symptoms that she states started about a week ago. States that started with a cough and gradually worsened to include shortness of breath, wheezing, post nasal drip, slight sore throat, fatigue and decreased appetite. Notes that symptoms are worse at night, especially when she is lying flat, which has made it difficult to sleep. She has a history of COPD and reports she has been taking her medications as prescribed, however demonstrated poor inhaler technique. Oxygen saturation is 91% today, which is slightly lower than her normal baseline.   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 Rudene Christians. States her knee has been bothering her since her surgery. Reports she has been receiving in-home physical therapy, but has not been back to see Dr. Rudene Christians since her surgery. Notes pain is intermittent and hurts more on the outside than the inside. It is also aggravated with walking.   Hypertension Taking medications as prescribed without difficulty. States that her husband checks her blood pressure regularly at home. Husband was unable to recall  average measurements. Blood pressure was stable today at 109/72 mmHg. Patient notes she is exercising with home physical therapy and plans to start working on eating a healthier diet. Denies any headaches, dizziness, light headedness, vision changes, edema, chest pain or palpitations.   Relevant past medical, surgical, family and social history reviewed and updated as indicated. Interim medical history since our last visit reviewed. Allergies and medications reviewed and updated.  Review of Systems  Constitutional: Positive for appetite change, diaphoresis and fatigue. Negative for chills and fever.  HENT: Positive for postnasal drip and sore throat. Negative for congestion, ear discharge, ear pain, rhinorrhea, sinus pain, sinus pressure, sneezing and trouble swallowing.   Eyes: Negative for pain, redness, itching and visual disturbance.  Respiratory: Positive for cough, shortness of breath and wheezing. Negative for chest tightness.   Cardiovascular: Negative for chest pain, palpitations and leg swelling.  Gastrointestinal: Negative for abdominal pain, constipation, diarrhea, nausea and vomiting.  Endocrine: Negative for polydipsia and polyuria.  Genitourinary: Negative for difficulty urinating and dysuria.  Musculoskeletal: Positive for arthralgias and joint swelling. Negative for myalgias.  Skin: Negative for rash.  Neurological: Negative for dizziness, weakness, light-headedness and headaches.  Psychiatric/Behavioral: Negative.     Per HPI unless specifically indicated above     Objective:    BP 109/72 (BP Location: Left Arm, Patient Position: Sitting, Cuff Size: Large)   Pulse 60   Temp 98.3 F (36.8 C)   Ht 5'  0.6" (1.539 m)   Wt 244 lb 12.8 oz (111 kg)   SpO2 91%   BMI 46.87 kg/m   Wt Readings from Last 3 Encounters:  03/13/16 244 lb 12.8 oz (111 kg)  02/02/16 254 lb (115.2 kg)  01/20/16 254 lb (115.2 kg)    Physical Exam  Constitutional: She is oriented to person,  place, and time. She appears well-developed and well-nourished.  HENT:  Head: Normocephalic and atraumatic.  Right Ear: Tympanic membrane and ear canal normal. No drainage or tenderness. Tympanic membrane is not erythematous.  Left Ear: Tympanic membrane and ear canal normal. No drainage or tenderness. Tympanic membrane is not erythematous.  Nose: No mucosal edema, rhinorrhea or sinus tenderness.  Eyes: Conjunctivae are normal. Right eye exhibits no discharge. Left eye exhibits no discharge.  Neck: Neck supple.  Cardiovascular: Normal rate, regular rhythm and normal heart sounds.   Pulmonary/Chest: Effort normal. No respiratory distress. She has decreased breath sounds.  Musculoskeletal: She exhibits no edema.  Some edma of the right knee.  Lymphadenopathy:       Head (right side): No submental, no submandibular, no tonsillar, no preauricular and no posterior auricular adenopathy present.       Head (left side): No submental, no submandibular, no tonsillar, no preauricular and no posterior auricular adenopathy present.    She has no cervical adenopathy.  Neurological: She is alert and oriented to person, place, and time.  Skin: Skin is warm and dry.  Psychiatric: She has a normal mood and affect. Her behavior is normal. Judgment and thought content normal.  Nursing note and vitals reviewed.   Results for orders placed or performed during the hospital encounter of 02/08/16  Urinalysis, Routine w reflex microscopic  Result Value Ref Range   Color, Urine YELLOW (A) YELLOW   APPearance CLEAR (A) CLEAR   Specific Gravity, Urine 1.009 1.005 - 1.030   pH 6.0 5.0 - 8.0   Glucose, UA NEGATIVE NEGATIVE mg/dL   Hgb urine dipstick SMALL (A) NEGATIVE   Bilirubin Urine NEGATIVE NEGATIVE   Ketones, ur NEGATIVE NEGATIVE mg/dL   Protein, ur NEGATIVE NEGATIVE mg/dL   Nitrite NEGATIVE NEGATIVE   Leukocytes, UA MODERATE (A) NEGATIVE   RBC / HPF 0-5 0 - 5 RBC/hpf   WBC, UA 0-5 0 - 5 WBC/hpf    Bacteria, UA FEW (A) NONE SEEN   Squamous Epithelial / LPF 0-5 (A) NONE SEEN   Mucous PRESENT   CBC  Result Value Ref Range   WBC 6.9 3.6 - 11.0 K/uL   RBC 4.21 3.80 - 5.20 MIL/uL   Hemoglobin 12.9 12.0 - 16.0 g/dL   HCT 39.1 35.0 - 47.0 %   MCV 92.8 80.0 - 100.0 fL   MCH 30.5 26.0 - 34.0 pg   MCHC 32.9 32.0 - 36.0 g/dL   RDW 15.3 (H) 11.5 - 14.5 %   Platelets 255 150 - 440 K/uL  Creatinine, serum  Result Value Ref Range   Creatinine, Ser 1.58 (H) 0.44 - 1.00 mg/dL   GFR calc non Af Amer 31 (L) >60 mL/min   GFR calc Af Amer 36 (L) >60 mL/min  CBC  Result Value Ref Range   WBC 8.5 3.6 - 11.0 K/uL   RBC 3.85 3.80 - 5.20 MIL/uL   Hemoglobin 11.6 (L) 12.0 - 16.0 g/dL   HCT 36.1 35.0 - 47.0 %   MCV 93.8 80.0 - 100.0 fL   MCH 30.2 26.0 - 34.0 pg   MCHC 32.2 32.0 -  36.0 g/dL   RDW 14.7 (H) 11.5 - 14.5 %   Platelets 227 150 - 440 K/uL  Basic metabolic panel  Result Value Ref Range   Sodium 137 135 - 145 mmol/L   Potassium 4.7 3.5 - 5.1 mmol/L   Chloride 107 101 - 111 mmol/L   CO2 22 22 - 32 mmol/L   Glucose, Bld 120 (H) 65 - 99 mg/dL   BUN 26 (H) 6 - 20 mg/dL   Creatinine, Ser 2.05 (H) 0.44 - 1.00 mg/dL   Calcium 8.5 (L) 8.9 - 10.3 mg/dL   GFR calc non Af Amer 23 (L) >60 mL/min   GFR calc Af Amer 26 (L) >60 mL/min   Anion gap 8 5 - 15  CBC  Result Value Ref Range   WBC 9.5 3.6 - 11.0 K/uL   RBC 3.34 (L) 3.80 - 5.20 MIL/uL   Hemoglobin 10.4 (L) 12.0 - 16.0 g/dL   HCT 31.3 (L) 35.0 - 47.0 %   MCV 93.5 80.0 - 100.0 fL   MCH 31.0 26.0 - 34.0 pg   MCHC 33.1 32.0 - 36.0 g/dL   RDW 14.5 11.5 - 14.5 %   Platelets 205 150 - 440 K/uL  Basic metabolic panel  Result Value Ref Range   Sodium 133 (L) 135 - 145 mmol/L   Potassium 5.0 3.5 - 5.1 mmol/L   Chloride 104 101 - 111 mmol/L   CO2 22 22 - 32 mmol/L   Glucose, Bld 104 (H) 65 - 99 mg/dL   BUN 36 (H) 6 - 20 mg/dL   Creatinine, Ser 2.15 (H) 0.44 - 1.00 mg/dL   Calcium 8.4 (L) 8.9 - 10.3 mg/dL   GFR calc non Af Amer 21 (L)  >60 mL/min   GFR calc Af Amer 25 (L) >60 mL/min   Anion gap 7 5 - 15  CK  Result Value Ref Range   Total CK 212 38 - 234 U/L  Urinalysis, Complete w Microscopic  Result Value Ref Range   Color, Urine YELLOW (A) YELLOW   APPearance CLEAR (A) CLEAR   Specific Gravity, Urine 1.012 1.005 - 1.030   pH 6.0 5.0 - 8.0   Glucose, UA NEGATIVE NEGATIVE mg/dL   Hgb urine dipstick SMALL (A) NEGATIVE   Bilirubin Urine NEGATIVE NEGATIVE   Ketones, ur NEGATIVE NEGATIVE mg/dL   Protein, ur 30 (A) NEGATIVE mg/dL   Nitrite NEGATIVE NEGATIVE   Leukocytes, UA TRACE (A) NEGATIVE   RBC / HPF 0-5 0 - 5 RBC/hpf   WBC, UA 0-5 0 - 5 WBC/hpf   Bacteria, UA RARE (A) NONE SEEN   Squamous Epithelial / LPF 0-5 (A) NONE SEEN   Mucous PRESENT   Sodium, urine, random  Result Value Ref Range   Sodium, Ur 41 mmol/L  Creatinine, urine, random  Result Value Ref Range   Creatinine, Urine 98 mg/dL  Basic metabolic panel  Result Value Ref Range   Sodium 139 135 - 145 mmol/L   Potassium 4.0 3.5 - 5.1 mmol/L   Chloride 108 101 - 111 mmol/L   CO2 27 22 - 32 mmol/L   Glucose, Bld 109 (H) 65 - 99 mg/dL   BUN 30 (H) 6 - 20 mg/dL   Creatinine, Ser 1.47 (H) 0.44 - 1.00 mg/dL   Calcium 8.4 (L) 8.9 - 10.3 mg/dL   GFR calc non Af Amer 34 (L) >60 mL/min   GFR calc Af Amer 39 (L) >60 mL/min   Anion gap 4 (L)  5 - 15  ABO/Rh  Result Value Ref Range   ABO/RH(D) O POS       Assessment & Plan:   Problem List Items Addressed This Visit      Unprioritized   Advanced care planning/counseling discussion    Refusing to consider dialysis.  Husband [name] is POA and will fill out forms.  DNR order form filled out.        Chronic kidney disease    CBC and COMP drawn today. Patient advised to make an appointment for follow-up with nephrology.      Relevant Orders   Comprehensive metabolic panel   CBC with Differential/Platelet   VITAMIN D 25 Hydroxy (Vit-D Deficiency, Fractures)   COPD (chronic obstructive pulmonary  disease) ([place])    Patient instructed on proper inhaler technique and provided with spacer to assist with inhaler use.      Relevant Medications   albuterol (PROVENTIL HFA) 108 (90 Base) MCG/ACT inhaler   COPD exacerbation (HCC)    Prescribed doxycycline 100 mg BID for 10 days.      Relevant Medications   albuterol (PROVENTIL HFA) 108 (90 Base) MCG/ACT inhaler   DNR (do not resuscitate)   Essential hypertension    Stable. Will continue on current medications.      Relevant Orders   Comprehensive metabolic panel   Right knee pain    Patient instructed to follow-up with surgeon/orthopedist  (Dr. Rudene Christians).       Other Visit Diagnoses    Acute exacerbation of chronic bronchitis ([place])    -  Primary   Relevant Medications   albuterol (PROVENTIL HFA) 108 (90 Base) MCG/ACT inhaler       Follow up plan: Return in about 3 months (around 06/11/2016) for follow-up.

## 2016-03-13 NOTE — Assessment & Plan Note (Signed)
Patient instructed on proper inhaler technique and provided with spacer to assist with inhaler use.

## 2016-03-13 NOTE — Patient Outreach (Signed)
Patient triggered RED on EMMI COPD Dashboard, notivication sent to:  Enzo Montgomery, RN

## 2016-03-13 NOTE — Assessment & Plan Note (Signed)
Patient instructed to follow-up with surgeon/orthopedist  (Dr. Rudene Christians).

## 2016-03-13 NOTE — Assessment & Plan Note (Signed)
Refusing to consider dialysis.  Husband [name] is POA and will fill out forms.  DNR order form filled out.

## 2016-03-14 ENCOUNTER — Other Ambulatory Visit: Payer: Self-pay

## 2016-03-14 DIAGNOSIS — I129 Hypertensive chronic kidney disease with stage 1 through stage 4 chronic kidney disease, or unspecified chronic kidney disease: Secondary | ICD-10-CM | POA: Diagnosis not present

## 2016-03-14 DIAGNOSIS — M1712 Unilateral primary osteoarthritis, left knee: Secondary | ICD-10-CM | POA: Diagnosis not present

## 2016-03-14 DIAGNOSIS — N183 Chronic kidney disease, stage 3 (moderate): Secondary | ICD-10-CM | POA: Diagnosis not present

## 2016-03-14 DIAGNOSIS — I2510 Atherosclerotic heart disease of native coronary artery without angina pectoris: Secondary | ICD-10-CM | POA: Diagnosis not present

## 2016-03-14 DIAGNOSIS — J449 Chronic obstructive pulmonary disease, unspecified: Secondary | ICD-10-CM | POA: Diagnosis not present

## 2016-03-14 DIAGNOSIS — F3289 Other specified depressive episodes: Secondary | ICD-10-CM | POA: Diagnosis not present

## 2016-03-14 DIAGNOSIS — F418 Other specified anxiety disorders: Secondary | ICD-10-CM | POA: Diagnosis not present

## 2016-03-14 DIAGNOSIS — Z471 Aftercare following joint replacement surgery: Secondary | ICD-10-CM | POA: Diagnosis not present

## 2016-03-14 LAB — CBC WITH DIFFERENTIAL/PLATELET
BASOS ABS: 0 10*3/uL (ref 0.0–0.2)
Basos: 0 %
EOS (ABSOLUTE): 0.2 10*3/uL (ref 0.0–0.4)
Eos: 4 %
HEMOGLOBIN: 10.8 g/dL — AB (ref 11.1–15.9)
Hematocrit: 34.4 % (ref 34.0–46.6)
Immature Grans (Abs): 0 10*3/uL (ref 0.0–0.1)
Immature Granulocytes: 0 %
LYMPHS ABS: 1.6 10*3/uL (ref 0.7–3.1)
Lymphs: 27 %
MCH: 29.3 pg (ref 26.6–33.0)
MCHC: 31.4 g/dL — AB (ref 31.5–35.7)
MCV: 94 fL (ref 79–97)
MONOCYTES: 8 %
Monocytes Absolute: 0.5 10*3/uL (ref 0.1–0.9)
Neutrophils Absolute: 3.6 10*3/uL (ref 1.4–7.0)
Neutrophils: 61 %
Platelets: 318 10*3/uL (ref 150–379)
RBC: 3.68 x10E6/uL — AB (ref 3.77–5.28)
RDW: 16.1 % — ABNORMAL HIGH (ref 12.3–15.4)
WBC: 6 10*3/uL (ref 3.4–10.8)

## 2016-03-14 LAB — COMPREHENSIVE METABOLIC PANEL
A/G RATIO: 1.4 (ref 1.2–2.2)
ALBUMIN: 3.8 g/dL (ref 3.5–4.8)
ALK PHOS: 85 IU/L (ref 39–117)
ALT: 8 IU/L (ref 0–32)
AST: 15 IU/L (ref 0–40)
BILIRUBIN TOTAL: 0.3 mg/dL (ref 0.0–1.2)
BUN / CREAT RATIO: 12 (ref 12–28)
BUN: 21 mg/dL (ref 8–27)
CO2: 23 mmol/L (ref 18–29)
Calcium: 9.5 mg/dL (ref 8.7–10.3)
Chloride: 106 mmol/L (ref 96–106)
Creatinine, Ser: 1.73 mg/dL — ABNORMAL HIGH (ref 0.57–1.00)
GFR calc Af Amer: 33 mL/min/{1.73_m2} — ABNORMAL LOW (ref >59)
GFR calc non Af Amer: 29 mL/min/{1.73_m2} — ABNORMAL LOW (ref >59)
GLUCOSE: 102 mg/dL — AB (ref 65–99)
Globulin, Total: 2.7 g/dL (ref 1.5–4.5)
POTASSIUM: 5.6 mmol/L — AB (ref 3.5–5.2)
Sodium: 147 mmol/L — ABNORMAL HIGH (ref 134–144)
Total Protein: 6.5 g/dL (ref 6.0–8.5)

## 2016-03-14 LAB — VITAMIN D 25 HYDROXY (VIT D DEFICIENCY, FRACTURES): VIT D 25 HYDROXY: 38.3 ng/mL (ref 30.0–100.0)

## 2016-03-14 NOTE — Patient Outreach (Addendum)
[facility]) Care Management  03/14/2016  Katherine [name] 01-06-1941 [id number]   EMMI- COPD RED ON EMMI ALERT Day # 11 Date: 03/10/16 Red Alert Reason: "# of times rescue inhaler used in past 24 hrs? 3"  Day #14 Date: 03/13/16 Red Alert Reason: "3 of times rescue inhaler used in past 24 hrs?4" "Been to follow up appt? No" "Have a way to get to f/u appt? No"   Outreach attempt #2 to patient. No answer at present and unable to leave message.     Plan: RN CM will send unsuccessful outreach letter to patient and close case if no response from patient within 10 business days.   Enzo Montgomery, RN,BSN,CCM [facility] Telephonic Care Management Coordinator Direct Phone: [phone] Toll Free: [phone] Fax: [phone]

## 2016-03-15 ENCOUNTER — Encounter: Payer: Self-pay | Admitting: Unknown Physician Specialty

## 2016-03-16 DIAGNOSIS — F3289 Other specified depressive episodes: Secondary | ICD-10-CM | POA: Diagnosis not present

## 2016-03-16 DIAGNOSIS — I129 Hypertensive chronic kidney disease with stage 1 through stage 4 chronic kidney disease, or unspecified chronic kidney disease: Secondary | ICD-10-CM | POA: Diagnosis not present

## 2016-03-16 DIAGNOSIS — I2510 Atherosclerotic heart disease of native coronary artery without angina pectoris: Secondary | ICD-10-CM | POA: Diagnosis not present

## 2016-03-16 DIAGNOSIS — N183 Chronic kidney disease, stage 3 (moderate): Secondary | ICD-10-CM | POA: Diagnosis not present

## 2016-03-16 DIAGNOSIS — Z471 Aftercare following joint replacement surgery: Secondary | ICD-10-CM | POA: Diagnosis not present

## 2016-03-16 DIAGNOSIS — M1712 Unilateral primary osteoarthritis, left knee: Secondary | ICD-10-CM | POA: Diagnosis not present

## 2016-03-16 DIAGNOSIS — F418 Other specified anxiety disorders: Secondary | ICD-10-CM | POA: Diagnosis not present

## 2016-03-16 DIAGNOSIS — J449 Chronic obstructive pulmonary disease, unspecified: Secondary | ICD-10-CM | POA: Diagnosis not present

## 2016-03-17 ENCOUNTER — Telehealth: Payer: Self-pay | Admitting: Unknown Physician Specialty

## 2016-03-17 NOTE — Telephone Encounter (Signed)
Katherine [name] with [facility]

## 2016-03-17 NOTE — Telephone Encounter (Signed)
Katherine [name] with [facility] called to let Dr. Wynetta Emery know the patient was sick today and could not do her therapy. Otherwise he states she has been doing pretty well.  Thank Dennis Bast Santiago Glad  [phone]

## 2016-03-18 DIAGNOSIS — F418 Other specified anxiety disorders: Secondary | ICD-10-CM | POA: Diagnosis not present

## 2016-03-18 DIAGNOSIS — I129 Hypertensive chronic kidney disease with stage 1 through stage 4 chronic kidney disease, or unspecified chronic kidney disease: Secondary | ICD-10-CM | POA: Diagnosis not present

## 2016-03-18 DIAGNOSIS — M1712 Unilateral primary osteoarthritis, left knee: Secondary | ICD-10-CM | POA: Diagnosis not present

## 2016-03-18 DIAGNOSIS — N183 Chronic kidney disease, stage 3 (moderate): Secondary | ICD-10-CM | POA: Diagnosis not present

## 2016-03-18 DIAGNOSIS — I2510 Atherosclerotic heart disease of native coronary artery without angina pectoris: Secondary | ICD-10-CM | POA: Diagnosis not present

## 2016-03-18 DIAGNOSIS — Z471 Aftercare following joint replacement surgery: Secondary | ICD-10-CM | POA: Diagnosis not present

## 2016-03-18 DIAGNOSIS — J449 Chronic obstructive pulmonary disease, unspecified: Secondary | ICD-10-CM | POA: Diagnosis not present

## 2016-03-18 DIAGNOSIS — F3289 Other specified depressive episodes: Secondary | ICD-10-CM | POA: Diagnosis not present

## 2016-03-20 ENCOUNTER — Other Ambulatory Visit: Payer: Self-pay | Admitting: Unknown Physician Specialty

## 2016-03-20 MED ORDER — MONTELUKAST SODIUM 10 MG PO TABS: 10.0000 mg | ORAL_TABLET | Freq: Every day | ORAL | 1 refills | Status: DC

## 2016-03-20 MED ORDER — HYDRALAZINE HCL 25 MG PO TABS: 25.0000 mg | ORAL_TABLET | Freq: Two times a day (BID) | ORAL | 5 refills | Status: DC

## 2016-03-20 MED ORDER — METOPROLOL TARTRATE 25 MG PO TABS: 12.5000 mg | ORAL_TABLET | Freq: Two times a day (BID) | ORAL | 0 refills | Status: DC

## 2016-03-20 MED ORDER — TIOTROPIUM BROMIDE MONOHYDRATE 18 MCG IN CAPS: 18.0000 ug | ORAL_CAPSULE | Freq: Every day | RESPIRATORY_TRACT | 12 refills | Status: DC

## 2016-03-20 NOTE — Telephone Encounter (Signed)
Pt needs a refill on montelukast (SINGULAIR) 10 MG tablet, metoprolol tartrate (LOPRESSOR) 25 MG tablet, hydrALAZINE (APRESOLINE) 25 MG tablet and SPIRIVA HANDIHALER 18 MCG inhalation capsule sent to [facility].

## 2016-03-20 NOTE — Telephone Encounter (Signed)
Routing to provider  

## 2016-03-21 ENCOUNTER — Telehealth: Payer: Self-pay | Admitting: Unknown Physician Specialty

## 2016-03-21 DIAGNOSIS — F418 Other specified anxiety disorders: Secondary | ICD-10-CM | POA: Diagnosis not present

## 2016-03-21 DIAGNOSIS — Z471 Aftercare following joint replacement surgery: Secondary | ICD-10-CM | POA: Diagnosis not present

## 2016-03-21 DIAGNOSIS — M1712 Unilateral primary osteoarthritis, left knee: Secondary | ICD-10-CM | POA: Diagnosis not present

## 2016-03-21 DIAGNOSIS — N183 Chronic kidney disease, stage 3 (moderate): Secondary | ICD-10-CM | POA: Diagnosis not present

## 2016-03-21 DIAGNOSIS — I129 Hypertensive chronic kidney disease with stage 1 through stage 4 chronic kidney disease, or unspecified chronic kidney disease: Secondary | ICD-10-CM | POA: Diagnosis not present

## 2016-03-21 DIAGNOSIS — J449 Chronic obstructive pulmonary disease, unspecified: Secondary | ICD-10-CM | POA: Diagnosis not present

## 2016-03-21 DIAGNOSIS — I2510 Atherosclerotic heart disease of native coronary artery without angina pectoris: Secondary | ICD-10-CM | POA: Diagnosis not present

## 2016-03-21 DIAGNOSIS — F3289 Other specified depressive episodes: Secondary | ICD-10-CM | POA: Diagnosis not present

## 2016-03-21 NOTE — Telephone Encounter (Signed)
I think we should call [facility] for an earlier appointment.

## 2016-03-21 NOTE — Telephone Encounter (Signed)
[facility]. I explained to them what was going on. They looked and said that the earliest they could do was 04/11/16 at 2:20. They stated that they had no appointments before this date. Moved patient's appointment to this date and I will call and let them know.

## 2016-03-21 NOTE — Telephone Encounter (Signed)
Routing to provider, FYI.  

## 2016-03-21 NOTE — Telephone Encounter (Signed)
Patients husband came by the office to let Malachy Mood know patient has an appointment with the kidney specialist on 04/17/2016 @ 11:00  Just FYI  Thanks

## 2016-03-21 NOTE — Telephone Encounter (Signed)
Called and spoke to patient's husband, [name]. I let him know that Malachy Mood wants the patient to be seen ASAP with kidney specialist because of high potassium. Husband stated that this was the earliest appointment they had available and that they told him they were booked up until March. Will route back to provider so she is aware.

## 2016-03-21 NOTE — Telephone Encounter (Signed)
Called and let patient's husband know about new day and time for appointment.

## 2016-03-21 NOTE — Telephone Encounter (Signed)
This appointment needs to be sooner as she has a high potassium.

## 2016-03-23 ENCOUNTER — Telehealth: Payer: Self-pay | Admitting: Unknown Physician Specialty

## 2016-03-23 NOTE — Telephone Encounter (Signed)
This encounter was created in error - please disregard.

## 2016-03-24 DIAGNOSIS — Z471 Aftercare following joint replacement surgery: Secondary | ICD-10-CM | POA: Diagnosis not present

## 2016-03-24 DIAGNOSIS — N183 Chronic kidney disease, stage 3 (moderate): Secondary | ICD-10-CM | POA: Diagnosis not present

## 2016-03-24 DIAGNOSIS — F418 Other specified anxiety disorders: Secondary | ICD-10-CM | POA: Diagnosis not present

## 2016-03-24 DIAGNOSIS — F3289 Other specified depressive episodes: Secondary | ICD-10-CM | POA: Diagnosis not present

## 2016-03-24 DIAGNOSIS — M1712 Unilateral primary osteoarthritis, left knee: Secondary | ICD-10-CM | POA: Diagnosis not present

## 2016-03-24 DIAGNOSIS — J449 Chronic obstructive pulmonary disease, unspecified: Secondary | ICD-10-CM | POA: Diagnosis not present

## 2016-03-24 DIAGNOSIS — I2510 Atherosclerotic heart disease of native coronary artery without angina pectoris: Secondary | ICD-10-CM | POA: Diagnosis not present

## 2016-03-24 DIAGNOSIS — I129 Hypertensive chronic kidney disease with stage 1 through stage 4 chronic kidney disease, or unspecified chronic kidney disease: Secondary | ICD-10-CM | POA: Diagnosis not present

## 2016-03-24 NOTE — Telephone Encounter (Signed)
Routing to provider, just a FYI.

## 2016-03-29 DIAGNOSIS — I2510 Atherosclerotic heart disease of native coronary artery without angina pectoris: Secondary | ICD-10-CM | POA: Diagnosis not present

## 2016-03-29 DIAGNOSIS — M2550 Pain in unspecified joint: Secondary | ICD-10-CM | POA: Diagnosis not present

## 2016-03-29 DIAGNOSIS — M1712 Unilateral primary osteoarthritis, left knee: Secondary | ICD-10-CM | POA: Diagnosis not present

## 2016-03-29 DIAGNOSIS — Z471 Aftercare following joint replacement surgery: Secondary | ICD-10-CM | POA: Diagnosis not present

## 2016-03-29 DIAGNOSIS — J449 Chronic obstructive pulmonary disease, unspecified: Secondary | ICD-10-CM | POA: Diagnosis not present

## 2016-03-29 DIAGNOSIS — F418 Other specified anxiety disorders: Secondary | ICD-10-CM | POA: Diagnosis not present

## 2016-03-29 DIAGNOSIS — Z96651 Presence of right artificial knee joint: Secondary | ICD-10-CM | POA: Diagnosis not present

## 2016-03-29 DIAGNOSIS — F3289 Other specified depressive episodes: Secondary | ICD-10-CM | POA: Diagnosis not present

## 2016-03-29 DIAGNOSIS — N183 Chronic kidney disease, stage 3 (moderate): Secondary | ICD-10-CM | POA: Diagnosis not present

## 2016-03-29 DIAGNOSIS — I129 Hypertensive chronic kidney disease with stage 1 through stage 4 chronic kidney disease, or unspecified chronic kidney disease: Secondary | ICD-10-CM | POA: Diagnosis not present

## 2016-03-31 DIAGNOSIS — J449 Chronic obstructive pulmonary disease, unspecified: Secondary | ICD-10-CM | POA: Diagnosis not present

## 2016-03-31 DIAGNOSIS — I129 Hypertensive chronic kidney disease with stage 1 through stage 4 chronic kidney disease, or unspecified chronic kidney disease: Secondary | ICD-10-CM | POA: Diagnosis not present

## 2016-03-31 DIAGNOSIS — F418 Other specified anxiety disorders: Secondary | ICD-10-CM | POA: Diagnosis not present

## 2016-03-31 DIAGNOSIS — N183 Chronic kidney disease, stage 3 (moderate): Secondary | ICD-10-CM | POA: Diagnosis not present

## 2016-03-31 DIAGNOSIS — Z471 Aftercare following joint replacement surgery: Secondary | ICD-10-CM | POA: Diagnosis not present

## 2016-03-31 DIAGNOSIS — F3289 Other specified depressive episodes: Secondary | ICD-10-CM | POA: Diagnosis not present

## 2016-03-31 DIAGNOSIS — M1712 Unilateral primary osteoarthritis, left knee: Secondary | ICD-10-CM | POA: Diagnosis not present

## 2016-03-31 DIAGNOSIS — I2510 Atherosclerotic heart disease of native coronary artery without angina pectoris: Secondary | ICD-10-CM | POA: Diagnosis not present

## 2016-04-03 ENCOUNTER — Other Ambulatory Visit
Admission: RE | Admit: 2016-04-03 | Discharge: 2016-04-03 | Disposition: A | Discharge status: Home / Self Care | Payer: Medicare HMO | Source: Ambulatory Visit | Attending: Orthopedic Surgery | Admitting: Orthopedic Surgery

## 2016-04-03 DIAGNOSIS — Z96651 Presence of right artificial knee joint: Secondary | ICD-10-CM | POA: Insufficient documentation

## 2016-04-03 DIAGNOSIS — M25561 Pain in right knee: Secondary | ICD-10-CM | POA: Diagnosis not present

## 2016-04-03 LAB — SYNOVIAL CELL COUNT + DIFF, W/ CRYSTALS
Crystals, Fluid: NONE SEEN
EOSINOPHILS-SYNOVIAL: 0 %
LYMPHOCYTES-SYNOVIAL FLD: 10 %
MONOCYTE-MACROPHAGE-SYNOVIAL FLUID: 2 %
Neutrophil, Synovial: 88 %
OTHER CELLS-SYN: 0
WBC, Synovial: 811 /mm3 — ABNORMAL HIGH (ref 0–200)

## 2016-04-04 DIAGNOSIS — I2510 Atherosclerotic heart disease of native coronary artery without angina pectoris: Secondary | ICD-10-CM | POA: Diagnosis not present

## 2016-04-04 DIAGNOSIS — Z471 Aftercare following joint replacement surgery: Secondary | ICD-10-CM | POA: Diagnosis not present

## 2016-04-04 DIAGNOSIS — J449 Chronic obstructive pulmonary disease, unspecified: Secondary | ICD-10-CM | POA: Diagnosis not present

## 2016-04-04 DIAGNOSIS — N183 Chronic kidney disease, stage 3 (moderate): Secondary | ICD-10-CM | POA: Diagnosis not present

## 2016-04-04 DIAGNOSIS — I129 Hypertensive chronic kidney disease with stage 1 through stage 4 chronic kidney disease, or unspecified chronic kidney disease: Secondary | ICD-10-CM | POA: Diagnosis not present

## 2016-04-04 DIAGNOSIS — F3289 Other specified depressive episodes: Secondary | ICD-10-CM | POA: Diagnosis not present

## 2016-04-04 DIAGNOSIS — F418 Other specified anxiety disorders: Secondary | ICD-10-CM | POA: Diagnosis not present

## 2016-04-04 DIAGNOSIS — M1712 Unilateral primary osteoarthritis, left knee: Secondary | ICD-10-CM | POA: Diagnosis not present

## 2016-04-05 DIAGNOSIS — M1712 Unilateral primary osteoarthritis, left knee: Secondary | ICD-10-CM | POA: Diagnosis not present

## 2016-04-05 DIAGNOSIS — Z471 Aftercare following joint replacement surgery: Secondary | ICD-10-CM | POA: Diagnosis not present

## 2016-04-05 DIAGNOSIS — J449 Chronic obstructive pulmonary disease, unspecified: Secondary | ICD-10-CM | POA: Diagnosis not present

## 2016-04-05 DIAGNOSIS — N183 Chronic kidney disease, stage 3 (moderate): Secondary | ICD-10-CM | POA: Diagnosis not present

## 2016-04-05 DIAGNOSIS — F418 Other specified anxiety disorders: Secondary | ICD-10-CM | POA: Diagnosis not present

## 2016-04-05 DIAGNOSIS — I2510 Atherosclerotic heart disease of native coronary artery without angina pectoris: Secondary | ICD-10-CM | POA: Diagnosis not present

## 2016-04-05 DIAGNOSIS — I129 Hypertensive chronic kidney disease with stage 1 through stage 4 chronic kidney disease, or unspecified chronic kidney disease: Secondary | ICD-10-CM | POA: Diagnosis not present

## 2016-04-05 DIAGNOSIS — F3289 Other specified depressive episodes: Secondary | ICD-10-CM | POA: Diagnosis not present

## 2016-04-06 DIAGNOSIS — J449 Chronic obstructive pulmonary disease, unspecified: Secondary | ICD-10-CM | POA: Diagnosis not present

## 2016-04-06 LAB — BODY FLUID CULTURE: Culture: NO GROWTH

## 2016-04-10 DIAGNOSIS — Z471 Aftercare following joint replacement surgery: Secondary | ICD-10-CM | POA: Diagnosis not present

## 2016-04-10 DIAGNOSIS — F3289 Other specified depressive episodes: Secondary | ICD-10-CM | POA: Diagnosis not present

## 2016-04-10 DIAGNOSIS — N183 Chronic kidney disease, stage 3 (moderate): Secondary | ICD-10-CM | POA: Diagnosis not present

## 2016-04-10 DIAGNOSIS — I129 Hypertensive chronic kidney disease with stage 1 through stage 4 chronic kidney disease, or unspecified chronic kidney disease: Secondary | ICD-10-CM | POA: Diagnosis not present

## 2016-04-10 DIAGNOSIS — M1712 Unilateral primary osteoarthritis, left knee: Secondary | ICD-10-CM | POA: Diagnosis not present

## 2016-04-10 DIAGNOSIS — J449 Chronic obstructive pulmonary disease, unspecified: Secondary | ICD-10-CM | POA: Diagnosis not present

## 2016-04-10 DIAGNOSIS — F418 Other specified anxiety disorders: Secondary | ICD-10-CM | POA: Diagnosis not present

## 2016-04-10 DIAGNOSIS — I2510 Atherosclerotic heart disease of native coronary artery without angina pectoris: Secondary | ICD-10-CM | POA: Diagnosis not present

## 2016-04-11 DIAGNOSIS — N183 Chronic kidney disease, stage 3 (moderate): Secondary | ICD-10-CM | POA: Diagnosis not present

## 2016-04-11 DIAGNOSIS — N2581 Secondary hyperparathyroidism of renal origin: Secondary | ICD-10-CM | POA: Diagnosis not present

## 2016-04-11 DIAGNOSIS — E875 Hyperkalemia: Secondary | ICD-10-CM | POA: Diagnosis not present

## 2016-04-11 DIAGNOSIS — I129 Hypertensive chronic kidney disease with stage 1 through stage 4 chronic kidney disease, or unspecified chronic kidney disease: Secondary | ICD-10-CM | POA: Diagnosis not present

## 2016-04-12 DIAGNOSIS — J449 Chronic obstructive pulmonary disease, unspecified: Secondary | ICD-10-CM | POA: Diagnosis not present

## 2016-04-17 DIAGNOSIS — I2510 Atherosclerotic heart disease of native coronary artery without angina pectoris: Secondary | ICD-10-CM | POA: Diagnosis not present

## 2016-04-17 DIAGNOSIS — Z471 Aftercare following joint replacement surgery: Secondary | ICD-10-CM | POA: Diagnosis not present

## 2016-04-17 DIAGNOSIS — J449 Chronic obstructive pulmonary disease, unspecified: Secondary | ICD-10-CM | POA: Diagnosis not present

## 2016-04-17 DIAGNOSIS — N183 Chronic kidney disease, stage 3 (moderate): Secondary | ICD-10-CM | POA: Diagnosis not present

## 2016-04-17 DIAGNOSIS — M1712 Unilateral primary osteoarthritis, left knee: Secondary | ICD-10-CM | POA: Diagnosis not present

## 2016-04-17 DIAGNOSIS — F418 Other specified anxiety disorders: Secondary | ICD-10-CM | POA: Diagnosis not present

## 2016-04-17 DIAGNOSIS — F3289 Other specified depressive episodes: Secondary | ICD-10-CM | POA: Diagnosis not present

## 2016-04-17 DIAGNOSIS — I129 Hypertensive chronic kidney disease with stage 1 through stage 4 chronic kidney disease, or unspecified chronic kidney disease: Secondary | ICD-10-CM | POA: Diagnosis not present

## 2016-04-18 ENCOUNTER — Other Ambulatory Visit: Payer: Self-pay | Admitting: *Deleted

## 2016-04-18 MED ORDER — FLUTICASONE FUROATE-VILANTEROL 100-25 MCG/INH IN AEPB: 1.0000 | INHALATION_SPRAY | Freq: Every day | RESPIRATORY_TRACT | 5 refills | Status: DC

## 2016-04-27 ENCOUNTER — Other Ambulatory Visit: Payer: Self-pay | Admitting: *Deleted

## 2016-04-27 MED ORDER — FLUTICASONE FUROATE-VILANTEROL 100-25 MCG/INH IN AEPB: 1.0000 | INHALATION_SPRAY | Freq: Every day | RESPIRATORY_TRACT | 5 refills | Status: DC

## 2016-05-01 DIAGNOSIS — M1712 Unilateral primary osteoarthritis, left knee: Secondary | ICD-10-CM | POA: Diagnosis not present

## 2016-05-01 DIAGNOSIS — F418 Other specified anxiety disorders: Secondary | ICD-10-CM | POA: Diagnosis not present

## 2016-05-01 DIAGNOSIS — F3289 Other specified depressive episodes: Secondary | ICD-10-CM | POA: Diagnosis not present

## 2016-05-01 DIAGNOSIS — N183 Chronic kidney disease, stage 3 (moderate): Secondary | ICD-10-CM | POA: Diagnosis not present

## 2016-05-01 DIAGNOSIS — I2510 Atherosclerotic heart disease of native coronary artery without angina pectoris: Secondary | ICD-10-CM | POA: Diagnosis not present

## 2016-05-01 DIAGNOSIS — I129 Hypertensive chronic kidney disease with stage 1 through stage 4 chronic kidney disease, or unspecified chronic kidney disease: Secondary | ICD-10-CM | POA: Diagnosis not present

## 2016-05-01 DIAGNOSIS — Z471 Aftercare following joint replacement surgery: Secondary | ICD-10-CM | POA: Diagnosis not present

## 2016-05-01 DIAGNOSIS — J449 Chronic obstructive pulmonary disease, unspecified: Secondary | ICD-10-CM | POA: Diagnosis not present

## 2016-05-04 DIAGNOSIS — J449 Chronic obstructive pulmonary disease, unspecified: Secondary | ICD-10-CM | POA: Diagnosis not present

## 2016-05-12 DIAGNOSIS — J449 Chronic obstructive pulmonary disease, unspecified: Secondary | ICD-10-CM | POA: Diagnosis not present

## 2016-05-16 ENCOUNTER — Other Ambulatory Visit: Payer: Self-pay | Admitting: Unknown Physician Specialty

## 2016-05-16 NOTE — Addendum Note (Signed)
Addended by: Clayborne Dana C on: 05/16/2016 02:56 PM   Modules accepted: Level of Service

## 2016-05-18 ENCOUNTER — Ambulatory Visit (INDEPENDENT_AMBULATORY_CARE_PROVIDER_SITE_OTHER): Payer: Medicare HMO | Admitting: Pulmonary Disease

## 2016-05-18 ENCOUNTER — Encounter: Payer: Self-pay | Admitting: Pulmonary Disease

## 2016-05-18 VITALS — BP 142/88 | HR 103 | Wt 242.0 lb

## 2016-05-18 DIAGNOSIS — R0902 Hypoxemia: Secondary | ICD-10-CM | POA: Diagnosis not present

## 2016-05-18 DIAGNOSIS — E662 Morbid (severe) obesity with alveolar hypoventilation: Secondary | ICD-10-CM | POA: Diagnosis not present

## 2016-05-18 DIAGNOSIS — R942 Abnormal results of pulmonary function studies: Secondary | ICD-10-CM | POA: Diagnosis not present

## 2016-05-18 DIAGNOSIS — G4734 Idiopathic sleep related nonobstructive alveolar hypoventilation: Secondary | ICD-10-CM | POA: Diagnosis not present

## 2016-05-18 DIAGNOSIS — J9611 Chronic respiratory failure with hypoxia: Secondary | ICD-10-CM

## 2016-05-18 DIAGNOSIS — F17200 Nicotine dependence, unspecified, uncomplicated: Secondary | ICD-10-CM | POA: Diagnosis not present

## 2016-05-18 NOTE — Patient Instructions (Addendum)
We again discussed the importance of smoking cessation. I strongly urge you to quit smoking altogether.  Continue efforts at weight loss. We discussed strategies and I suggested eating wholesome foods, no snack foods, more vegetables, low sugar fruits  Continue oxygen therapy at night.  We will work to obtain a portable oxygen device to be used during the daytime with any significant exertion or exercise  Follow-up in 3-4 months

## 2016-05-18 NOTE — Progress Notes (Signed)
PULMONARY OFFICE FOLLOW UP NOTE  Requesting MD/Service: [name], NP Date of initial consultation: 01/20/16 Reason for consultation:  Chronic hypoxemic respiratory failure, COPD, OHS  PT PROFILE: 76 y.o. F smoker referred for evaluation of chronic hypoxemic respiratory failure COPD, OHS.   DATA: 02/12/15 CT chest:No evidence of pulmonary embolism. No evidence of acute cardiopulmonary disease 06/21/15 spirometry: FVC: 1.82 L (85 %pred), FEV1:  1.38 L (83 %pred), FEV1/FVC: 75%   INTERVAL: R knee surgery in [id number] with 10 days in rehab after that  SUBJ:  This is a routine reevaluation. There've been no major respiratory or pulmonary problems. She continues to have exertional dyspnea. However she is more limited by her right knee pain. She continues to wear oxygen at night. She is not wearing oxygen during the daytime. Denies CP, fever, purulent sputum, hemoptysis, LE edema and calf tenderness  She is smoking 2-3 cigarettes per day, per her report. He exhibits little insight regarding the importance of smoking cessation. He has lost 8 pounds since last visit. She attributes this to poor appetite.  Vitals:   05/18/16 0927  BP: (!) 142/88  Pulse: (!) 103  SpO2: 91%  Weight: 109.8 kg (242 lb)  RA  Initially 85% on RA after ambulation into exam room. Recovered to 91% After a couple of minutes while sitting quietly.  EXAM:  Gen: Obese, No overt respiratory distress HEENT: NCAT, sclera white, oropharynx normal Neck: Short, thick neck, JVP cannot be assessed Lungs: Mildly diminished without adventitious sounds Cardiovascular: distant HS reg rhythm, no murmurs noted Abdomen: Obese, soft, nontender, normal BS Ext: without clubbing, cyanosis. Syymetric ankle edema Neuro: CNs grossly intact, motor and sensory intact Skin: Limited exam, no lesions noted  DATA:   BMP Latest Ref Rng & Units 03/13/2016 02/11/2016 02/10/2016  Glucose 65 - 99 mg/dL 102(H) 109(H) 104(H)  BUN 8 - 27 mg/dL  21 30(H) 36(H)  Creatinine 0.57 - 1.00 mg/dL 1.73(H) 1.47(H) 2.15(H)  BUN/Creat Ratio 12 - 28 12 - -  Sodium 134 - 144 mmol/L 147(H) 139 133(L)  Potassium 3.5 - 5.2 mmol/L 5.6(H) 4.0 5.0  Chloride 96 - 106 mmol/L 106 108 104  CO2 18 - 29 mmol/L 23 27 22   Calcium 8.7 - 10.3 mg/dL 9.5 8.4(L) 8.4(L)    CBC Latest Ref Rng & Units 03/13/2016 02/10/2016 02/09/2016  WBC 3.4 - 10.8 x10E3/uL 6.0 9.5 8.5  Hemoglobin 12.0 - 16.0 g/dL - 10.4(L) 11.6(L)  Hematocrit 34.0 - 46.6 % 34.4 31.3(L) 36.1  Platelets 150 - 379 x10E3/uL 318 205 227    CXR (11/28/15): No new film    IMPRESSION:     ICD-9-CM ICD-10-CM   1. 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 Chronic respiratory failure with hypoxia (HCC) 518.83 J96.11    799.02    3. Smoker 305.1 F17.200   4. Nocturnal hypoxemia 327.24 G47.34   5. Exercise hypoxemia 799.02 R09.02   6. Restrictive pattern present on pulmonary function testing 794.2 R94.2      PLAN:  1) We again discussed the importance of smoking cessation. I strongly urged that she quit smoking altogether. 2) Continue efforts at weight loss. We discussed strategies and I suggested eating wholesome foods, no snack foods, more vegetables, low sugar fruits 3) Continue oxygen therapy at night. 4) We will work to obtain a portable oxygen device to be used during the daytime with any significant exertion or exercise 5) Follow-up in 3-4 months   Merton Border, MD PCCM service Mobile [phone] Pager  [phone] 05/18/2016 9:52 AM

## 2016-06-04 DIAGNOSIS — J449 Chronic obstructive pulmonary disease, unspecified: Secondary | ICD-10-CM | POA: Diagnosis not present

## 2016-06-12 ENCOUNTER — Encounter: Payer: Self-pay | Admitting: Unknown Physician Specialty

## 2016-06-12 ENCOUNTER — Ambulatory Visit (INDEPENDENT_AMBULATORY_CARE_PROVIDER_SITE_OTHER): Payer: Medicare HMO | Admitting: Unknown Physician Specialty

## 2016-06-12 VITALS — BP 107/75 | HR 82 | Temp 99.0°F | Ht 60.2 in | Wt 238.8 lb

## 2016-06-12 DIAGNOSIS — N3944 Nocturnal enuresis: Secondary | ICD-10-CM

## 2016-06-12 DIAGNOSIS — Z0000 Encounter for general adult medical examination without abnormal findings: Secondary | ICD-10-CM

## 2016-06-12 DIAGNOSIS — R5382 Chronic fatigue, unspecified: Secondary | ICD-10-CM | POA: Insufficient documentation

## 2016-06-12 DIAGNOSIS — R32 Unspecified urinary incontinence: Secondary | ICD-10-CM | POA: Insufficient documentation

## 2016-06-12 DIAGNOSIS — N183 Chronic kidney disease, stage 3 unspecified: Secondary | ICD-10-CM

## 2016-06-12 DIAGNOSIS — M25562 Pain in left knee: Secondary | ICD-10-CM

## 2016-06-12 DIAGNOSIS — J432 Centrilobular emphysema: Secondary | ICD-10-CM | POA: Diagnosis not present

## 2016-06-12 DIAGNOSIS — E559 Vitamin D deficiency, unspecified: Secondary | ICD-10-CM | POA: Diagnosis not present

## 2016-06-12 DIAGNOSIS — Z720 Tobacco use: Secondary | ICD-10-CM | POA: Diagnosis not present

## 2016-06-12 DIAGNOSIS — M7711 Lateral epicondylitis, right elbow: Secondary | ICD-10-CM

## 2016-06-12 DIAGNOSIS — G8929 Other chronic pain: Secondary | ICD-10-CM

## 2016-06-12 DIAGNOSIS — I10 Essential (primary) hypertension: Secondary | ICD-10-CM

## 2016-06-12 DIAGNOSIS — Z66 Do not resuscitate: Secondary | ICD-10-CM | POA: Diagnosis not present

## 2016-06-12 DIAGNOSIS — Z7189 Other specified counseling: Secondary | ICD-10-CM

## 2016-06-12 DIAGNOSIS — J449 Chronic obstructive pulmonary disease, unspecified: Secondary | ICD-10-CM | POA: Diagnosis not present

## 2016-06-12 HISTORY — DX: Pain in left knee: M25.562

## 2016-06-12 MED ORDER — LIDOCAINE 0.5 % EX GEL: CUTANEOUS | 12 refills | Status: DC

## 2016-06-12 NOTE — Assessment & Plan Note (Addendum)
Tennis elbow brace.  Use Lidocaine gel.  Exercises given

## 2016-06-12 NOTE — Assessment & Plan Note (Signed)
Offered injection.  Pt refused.  She will see Dr. Rudene Christians

## 2016-06-12 NOTE — Assessment & Plan Note (Signed)
Needs briefs

## 2016-06-12 NOTE — Assessment & Plan Note (Addendum)
A voluntary discussion about advance care planning including the explanation and discussion of advance directives was extensively discussed  with the patient.  Explanation about the health care proxy and Living will was reviewed and packet with forms with explanation of how to fill them out was given.  During this discussion, the patient was able to identify a health care proxy as her husband and plans/does not plan to fill out the paperwork required.  Patient was offered a separate [place] visit for further assistance with forms.  Note they decided she was a DNR and not wanting dialysis

## 2016-06-12 NOTE — Assessment & Plan Note (Signed)
Check CBC and TSH

## 2016-06-12 NOTE — Assessment & Plan Note (Signed)
Discussion with her and husband.  Still desires DNR status

## 2016-06-12 NOTE — Patient Instructions (Addendum)
For elbow Aspercream Lidoderm gel exercises

## 2016-06-12 NOTE — Assessment & Plan Note (Signed)
She has cut back significantly.  Encouraged to pick a quit date.  She and her husband are working on this

## 2016-06-12 NOTE — Assessment & Plan Note (Signed)
Taking inhalers, nebulizers, and on O2

## 2016-06-12 NOTE — Assessment & Plan Note (Signed)
Stable, continue present medications.   

## 2016-06-12 NOTE — Assessment & Plan Note (Signed)
Continue to monitor here and at [facility]

## 2016-06-12 NOTE — Progress Notes (Signed)
BP 107/75   Pulse 82   Temp 99 F (37.2 C)   Ht 5' 0.2" (1.529 m)   Wt 238 lb 12.8 oz (108.3 kg)   SpO2 92%   BMI 46.33 kg/m    Subjective:    Patient ID: Katherine [name], female    DOB: [birth year], 76 y.o.   MRN: [id number]  HPI: Katherine [name] is a 76 y.o. female  Chief Complaint  Patient presents with  . Medicare Wellness    pt's husband states that patient has been running a slight fever every morning when waking up, states this has been going on for a while   . Hand Strength    pt's husband states that the pt has not had hand strength, pt states that she does not have feeling in her left pinky finger  . Hot Flashes    pt's husband states that the patient stays hot all the time  . Fatigue    pt's husband states that the patient is tired all of the time   . Orders    pt states that she would like an order for incontinence briefs- states she wears size XL   Functional Status Survey: Is the patient deaf or have difficulty hearing?: No Does the patient have difficulty seeing, even when wearing glasses/contacts?: No Does the patient have difficulty concentrating, remembering, or making decisions?: No Does the patient have difficulty walking or climbing stairs?: Yes Does the patient have difficulty dressing or bathing?: No Does the patient have difficulty doing errands alone such as visiting a doctor's office or shopping?: No Fall Risk  06/12/2016 11/10/2015 08/12/2014  Falls in the past year? No No No   Past Medical History:  Diagnosis Date  . Angioedema   . Anxiety   . Anxiety and depression   . Chronic constipation   . Chronic kidney disease    stage 3  . COPD (chronic obstructive pulmonary disease) ([place])   . Gallstones   . GERD (gastroesophageal reflux disease)   . Hypertension   . Left ventricular hypertrophy   . Osteoarthritis   . Prurigo nodularis   . Tobacco abuse   . Vitamin D deficiency disease    Social History   Social History  . Marital status:  Married    Spouse name: N/A  . Number of children: N/A  . Years of education: N/A   Occupational History  . Not on file.   Social History Main Topics  . Smoking status: Light Tobacco Smoker    Packs/day: 0.00    Years: 52.00    Types: Cigarettes  . Smokeless tobacco: Never Used     Comment: smokes 1.5 cigarettes daily-12/7  . Alcohol use No     Comment: rare  . Drug use: No  . Sexual activity: Yes   Other Topics Concern  . Not on file   Social History Narrative  . No narrative on file   Family History  Problem Relation Age of Onset  . Alcohol abuse Mother   . Sickle cell anemia Daughter   . Hypertension Son   . Cancer Neg Hx   . COPD Neg Hx   . Diabetes Neg Hx   . Heart disease Neg Hx   . Stroke Neg Hx    Past Surgical History:  Procedure Laterality Date  . CHOLECYSTECTOMY    . 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   Procedure: TOTAL KNEE ARTHROPLASTY;  Surgeon: Legrand Como  Rudene Christians, MD;  Location: [facility] ORS;  Service: Orthopedics;  Laterality: Right;   Depression screen [facility] 2/9 06/12/2016 11/10/2015 09/07/2015 08/12/2014  Decreased Interest 2 0 0 0  Down, Depressed, Hopeless 0 0 0 0  PHQ - 2 Score 2 0 0 0  Altered sleeping 0 - 0 -  Tired, decreased energy 3 - 1 -  Change in appetite 2 - 2 -  Feeling bad or failure about yourself  0 - 0 -  Trouble concentrating 0 - 0 -  Moving slowly or fidgety/restless 0 - 0 -  Suicidal thoughts 0 - 0 -  PHQ-9 Score 7 - 3 -    Fatigue She is tired pretty much all the time. CPAP at night .  Husband noting she is hot all the time and temperature is below normal at 98.2.  Strength is down in hands and right arm is swollen.  Losing feeling in the right pinky.    Knee pain Weakness and decreased feeling left leg.  Had a knee replacement right leg.  Left knee now bothering.  Planning on seeing her Orthopedist.     COPD/Hypoxia Using oxygen all day.  Testing being done for a concentrator.  Using  all nebulizers and inhalers prescribed.  Smoking 3-4/day which is much less than previous.  Trying to quit.    CKD Followed by [facility]  Enuresis Requesting absorbant briefs.  Needs briefs at night as she can't get up to use the bathroom.  She estimates she needs 2-3/day.     Relevant past medical, surgical, family and social history reviewed and updated as indicated. Interim medical history since our last visit reviewed. Allergies and medications reviewed and updated.  Review of Systems  Constitutional: Positive for fatigue and fever. Negative for unexpected weight change.  HENT: Negative.   Eyes: Negative.   Respiratory: Positive for cough, chest tightness and shortness of breath.   Cardiovascular: Negative for chest pain, palpitations and leg swelling.  Gastrointestinal: Negative.   Endocrine: Positive for heat intolerance. Negative for polydipsia and polyphagia.  Genitourinary: Positive for enuresis. Negative for difficulty urinating.  Musculoskeletal: Positive for arthralgias.  Neurological: Positive for weakness.  Psychiatric/Behavioral: Negative for agitation and hallucinations.    Per HPI unless specifically indicated above     Objective:    BP 107/75   Pulse 82   Temp 99 F (37.2 C)   Ht 5' 0.2" (1.529 m)   Wt 238 lb 12.8 oz (108.3 kg)   SpO2 92%   BMI 46.33 kg/m   Wt Readings from Last 3 Encounters:  06/12/16 238 lb 12.8 oz (108.3 kg)  05/18/16 242 lb (109.8 kg)  03/13/16 244 lb 12.8 oz (111 kg)    Physical Exam  Constitutional: She is oriented to person, place, and time. She appears well-developed and well-nourished.  HENT:  Head: Normocephalic and atraumatic.  Eyes: Pupils are equal, round, and reactive to light. Right eye exhibits no discharge. Left eye exhibits no discharge. No scleral icterus.  Neck: Normal range of motion. Neck supple. Carotid bruit is not present. No thyromegaly present.  Cardiovascular: Normal rate, regular rhythm and normal  heart sounds.  Exam reveals no gallop and no friction rub.   No murmur heard. Pulmonary/Chest: Effort normal and breath sounds normal. No respiratory distress. She has no wheezes. She has no rales.  Abdominal: Soft. Bowel sounds are normal. There is no tenderness. There is no rebound.  Genitourinary: No breast swelling, tenderness or discharge.  Musculoskeletal: Normal range of  motion.       Right elbow: She exhibits swelling. Tenderness found. Lateral epicondyle tenderness noted.  Bilateral legs 2 plus strength 3 plus bilateral upper extremities.    Lymphadenopathy:    She has no cervical adenopathy.  Neurological: She is alert and oriented to person, place, and time.  Skin: Skin is warm, dry and intact. No rash noted.  Psychiatric: She has a normal mood and affect. Her speech is normal and behavior is normal. Judgment and thought content normal. Cognition and memory are normal.    Results for orders placed or performed during the hospital encounter of 04/03/16  Body fluid culture  Result Value Ref Range   Specimen Description KNEE RIGHT KNEE    Special Requests NONE    Gram Stain      ABUNDANT WBC PRESENT, PREDOMINANTLY MONONUCLEAR NO ORGANISMS SEEN    Culture      NO GROWTH 3 DAYS Performed at [facility] [address], [place], [place] [address]    Report Status 04/06/2016 FINAL   Synovial cell count + diff, w/ crystals  Result Value Ref Range   Color, Synovial RED (A) YELLOW   Appearance-Synovial TURBID (A) CLEAR   Crystals, Fluid NO CRYSTALS SEEN    WBC, Synovial 811 (H) 0 - 200 /cu mm   Neutrophil, Synovial 88 %   Lymphocytes-Synovial Fld 10 %   Monocyte-Macrophage-Synovial Fluid 2 %   Eosinophils-Synovial 0 %   Other Cells-SYN 0       Assessment & Plan:   Problem List Items Addressed This Visit      Unprioritized   Advanced care planning/counseling discussion    A voluntary discussion about advance care planning including the explanation and  discussion of advance directives was extensively discussed  with the patient.  Explanation about the health care proxy and Living will was reviewed and packet with forms with explanation of how to fill them out was given.  During this discussion, the patient was able to identify a health care proxy as her husband and plans/does not plan to fill out the paperwork required.  Patient was offered a separate [facility] visit for further assistance with forms.  Note they decided she was a DNR and not wanting dialysis       Chronic fatigue    Check CBC and TSH      Relevant Orders   TSH   Comprehensive metabolic panel   CBC with Differential/Platelet   VITAMIN D 25 Hydroxy (Vit-D Deficiency, Fractures)   Chronic kidney disease    Continue to monitor here and at [facility]      COPD (chronic obstructive pulmonary disease) ([place])    Taking inhalers, nebulizers, and on O2      DNR (do not resuscitate)    Discussion with her and husband.  Still desires DNR status      Essential hypertension    Stable, continue present medications.        Relevant Orders   Comprehensive metabolic panel   Lipid Panel w/o Chol/HDL Ratio   Incontinent of urine    Needs briefs      Left knee pain    Offered injection.  Pt refused.  She will see Dr. Rudene Christians      Right lateral epicondylitis    Tennis elbow brace.  Use Lidocaine gel.  Exercises given      Tobacco abuse    She has cut back significantly.  Encouraged to pick a quit date.  She and her husband are working on this       Other Visit Diagnoses    Annual physical exam    -  Primary   Vitamin D deficiency           Follow up plan: Return in about 3 months (around 09/11/2016) for regular visit.

## 2016-06-15 ENCOUNTER — Other Ambulatory Visit: Payer: Medicare HMO

## 2016-06-15 DIAGNOSIS — I10 Essential (primary) hypertension: Secondary | ICD-10-CM

## 2016-06-15 DIAGNOSIS — R5382 Chronic fatigue, unspecified: Secondary | ICD-10-CM | POA: Diagnosis not present

## 2016-06-15 LAB — LIPID PANEL PICCOLO, WAIVED
CHOL/HDL RATIO PICCOLO,WAIVE: 2.7 mg/dL
Cholesterol Piccolo, Waived: 148 mg/dL (ref <200)
HDL Chol Piccolo, Waived: 55 mg/dL — ABNORMAL LOW (ref >59)
LDL Chol Calc Piccolo Waived: 71 mg/dL (ref <100)
TRIGLYCERIDES PICCOLO,WAIVED: 109 mg/dL (ref <150)
VLDL CHOL CALC PICCOLO,WAIVE: 22 mg/dL (ref <30)

## 2016-06-16 ENCOUNTER — Encounter: Payer: Self-pay | Admitting: Unknown Physician Specialty

## 2016-06-16 LAB — COMPREHENSIVE METABOLIC PANEL
ALK PHOS: 87 IU/L (ref 39–117)
ALT: 6 IU/L (ref 0–32)
AST: 11 IU/L (ref 0–40)
Albumin/Globulin Ratio: 1.1 — ABNORMAL LOW (ref 1.2–2.2)
Albumin: 3.7 g/dL (ref 3.5–4.8)
BUN/Creatinine Ratio: 13 (ref 12–28)
BUN: 21 mg/dL (ref 8–27)
Bilirubin Total: 0.3 mg/dL (ref 0.0–1.2)
CALCIUM: 9.6 mg/dL (ref 8.7–10.3)
CO2: 24 mmol/L (ref 18–29)
CREATININE: 1.56 mg/dL — AB (ref 0.57–1.00)
Chloride: 99 mmol/L (ref 96–106)
GFR calc Af Amer: 37 mL/min/{1.73_m2} — ABNORMAL LOW (ref >59)
GFR calc non Af Amer: 32 mL/min/{1.73_m2} — ABNORMAL LOW (ref >59)
Globulin, Total: 3.4 g/dL (ref 1.5–4.5)
Glucose: 101 mg/dL — ABNORMAL HIGH (ref 65–99)
Potassium: 4.4 mmol/L (ref 3.5–5.2)
SODIUM: 138 mmol/L (ref 134–144)
TOTAL PROTEIN: 7.1 g/dL (ref 6.0–8.5)

## 2016-06-26 ENCOUNTER — Other Ambulatory Visit: Payer: Self-pay | Admitting: Unknown Physician Specialty

## 2016-07-04 DIAGNOSIS — J449 Chronic obstructive pulmonary disease, unspecified: Secondary | ICD-10-CM | POA: Diagnosis not present

## 2016-07-12 DIAGNOSIS — J449 Chronic obstructive pulmonary disease, unspecified: Secondary | ICD-10-CM | POA: Diagnosis not present

## 2016-07-28 ENCOUNTER — Other Ambulatory Visit: Payer: Self-pay | Admitting: Unknown Physician Specialty

## 2016-08-04 DIAGNOSIS — J449 Chronic obstructive pulmonary disease, unspecified: Secondary | ICD-10-CM | POA: Diagnosis not present

## 2016-08-12 DIAGNOSIS — J449 Chronic obstructive pulmonary disease, unspecified: Secondary | ICD-10-CM | POA: Diagnosis not present
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
## 2016-08-29 ENCOUNTER — Other Ambulatory Visit: Payer: Self-pay | Admitting: Unknown Physician Specialty

## 2016-09-03 DIAGNOSIS — J449 Chronic obstructive pulmonary disease, unspecified: Secondary | ICD-10-CM | POA: Diagnosis not present

## 2016-09-06 ENCOUNTER — Other Ambulatory Visit: Payer: Self-pay | Admitting: Unknown Physician Specialty

## 2016-09-06 ENCOUNTER — Ambulatory Visit (INDEPENDENT_AMBULATORY_CARE_PROVIDER_SITE_OTHER): Payer: Medicare HMO

## 2016-09-06 VITALS — BP 169/87 | HR 74 | Temp 98.3°F | Resp 19 | Ht 60.0 in | Wt 233.4 lb

## 2016-09-06 DIAGNOSIS — Z1231 Encounter for screening mammogram for malignant neoplasm of breast: Secondary | ICD-10-CM | POA: Diagnosis not present

## 2016-09-06 DIAGNOSIS — Z0000 Encounter for general adult medical examination without abnormal findings: Secondary | ICD-10-CM

## 2016-09-06 DIAGNOSIS — Z13820 Encounter for screening for osteoporosis: Secondary | ICD-10-CM

## 2016-09-06 DIAGNOSIS — Z23 Encounter for immunization: Secondary | ICD-10-CM | POA: Diagnosis not present

## 2016-09-06 MED ORDER — MECLIZINE HCL 25 MG PO TABS: 25.0000 mg | ORAL_TABLET | Freq: Three times a day (TID) | ORAL | 0 refills | Status: DC | PRN

## 2016-09-06 NOTE — Progress Notes (Signed)
Nurse health advisor reporting dizzyness

## 2016-09-06 NOTE — Patient Instructions (Signed)
Katherine [name] , Thank you for taking time to come for your Medicare Wellness Visit. I appreciate your ongoing commitment to your health goals. Please review the following plan we discussed and let me know if I can assist you in the future.   Screening recommendations/referrals: Colonoscopy: Completed 08/02/2012 Mammogram: ordered, need to schedule Bone Density: ordered, need to schedule Recommended yearly ophthalmology/optometry visit for glaucoma screening and checkup Recommended yearly dental visit for hygiene and checkup  Vaccinations: Influenza vaccine: due 10/2016 Pneumococcal vaccine: prevnar 13 done today, pneumovax 23 due 09/06/2017 Tdap vaccine: due, declined Shingles vaccine: due,declined  Advanced directives: Advance directive discussed with you today. I have provided a copy for you to complete at home and have notarized. Once this is complete please bring a copy in to our office so we can scan it into your chart.  Conditions/risks identified: smoking cessation discussed  Next appointment: Follow up on 09/12/2016 ay 9:30am with Dr.[name]. Follow up in one year for your annual wellness exam.    Preventive Care 65 Years and Older, Female Preventive care refers to lifestyle choices and visits with your health care provider that can promote health and wellness. What does preventive care include?  A yearly physical exam. This is also called an annual well check.  Dental exams once or twice a year.  Routine eye exams. Ask your health care provider how often you should have your eyes checked.  Personal lifestyle choices, including:  Daily care of your teeth and gums.  Regular physical activity.  Eating a healthy diet.  Avoiding tobacco and drug use.  Limiting alcohol use.  Practicing safe sex.  Taking low-dose aspirin every day.  Taking vitamin and mineral supplements as recommended by your health care provider. What happens during an annual well check? The services  and screenings done by your health care provider during your annual well check will depend on your age, overall health, lifestyle risk factors, and family history of disease. Counseling  Your health care provider may ask you questions about your:  Alcohol use.  Tobacco use.  Drug use.  Emotional well-being.  Home and relationship well-being.  Sexual activity.  Eating habits.  History of falls.  Memory and ability to understand (cognition).  Work and work Statistician.  Reproductive health. Screening  You may have the following tests or measurements:  Height, weight, and BMI.  Blood pressure.  Lipid and cholesterol levels. These may be checked every 5 years, or more frequently if you are over 31 years old.  Skin check.  Lung cancer screening. You may have this screening every year starting at age 103 if you have a 30-pack-year history of smoking and currently smoke or have quit within the past 15 years.  Fecal occult blood test (FOBT) of the stool. You may have this test every year starting at age 34.  Flexible sigmoidoscopy or colonoscopy. You may have a sigmoidoscopy every 5 years or a colonoscopy every 10 years starting at age 32.  Hepatitis C blood test.  Hepatitis B blood test.  Sexually transmitted disease (STD) testing.  Diabetes screening. This is done by checking your blood sugar (glucose) after you have not eaten for a while (fasting). You may have this done every 1-3 years.  Bone density scan. This is done to screen for osteoporosis. You may have this done starting at age 51.  Mammogram. This may be done every 1-2 years. Talk to your health care provider about how often you should have regular mammograms. Talk with  your health care provider about your test results, treatment options, and if necessary, the need for more tests. Vaccines  Your health care provider may recommend certain vaccines, such as:  Influenza vaccine. This is recommended every  year.  Tetanus, diphtheria, and acellular pertussis (Tdap, Td) vaccine. You may need a Td booster every 10 years.  Zoster vaccine. You may need this after age 4.  Pneumococcal 13-valent conjugate (PCV13) vaccine. One dose is recommended after age 94.  Pneumococcal polysaccharide (PPSV23) vaccine. One dose is recommended after age 1. Talk to your health care provider about which screenings and vaccines you need and how often you need them. This information is not intended to replace advice given to you by your health care provider. Make sure you discuss any questions you have with your health care provider. Document Released: 02/26/2015 Document Revised: 10/20/2015 Document Reviewed: 12/01/2014 Elsevier Interactive Patient Education  2017 [employer] Prevention in the Home Falls can cause injuries. They can happen to people of all ages. There are many things you can do to make your home safe and to help prevent falls. What can I do on the outside of my home?  Regularly fix the edges of walkways and driveways and fix any cracks.  Remove anything that might make you trip as you walk through a door, such as a raised step or threshold.  Trim any bushes or trees on the path to your home.  Use bright outdoor lighting.  Clear any walking paths of anything that might make someone trip, such as rocks or tools.  Regularly check to see if handrails are loose or broken. Make sure that both sides of any steps have handrails.  Any raised decks and porches should have guardrails on the edges.  Have any leaves, snow, or ice cleared regularly.  Use sand or salt on walking paths during winter.  Clean up any spills in your garage right away. This includes oil or grease spills. What can I do in the bathroom?  Use night lights.  Install grab bars by the toilet and in the tub and shower. Do not use towel bars as grab bars.  Use non-skid mats or decals in the tub or shower.  If you  need to sit down in the shower, use a plastic, non-slip stool.  Keep the floor dry. Clean up any water that spills on the floor as soon as it happens.  Remove soap buildup in the tub or shower regularly.  Attach bath mats securely with double-sided non-slip rug tape.  Do not have throw rugs and other things on the floor that can make you trip. What can I do in the bedroom?  Use night lights.  Make sure that you have a light by your bed that is easy to reach.  Do not use any sheets or blankets that are too big for your bed. They should not hang down onto the floor.  Have a firm chair that has side arms. You can use this for support while you get dressed.  Do not have throw rugs and other things on the floor that can make you trip. What can I do in the kitchen?  Clean up any spills right away.  Avoid walking on wet floors.  Keep items that you use a lot in easy-to-reach places.  If you need to reach something above you, use a strong step stool that has a grab bar.  Keep electrical cords out of the way.  Do not  use floor polish or wax that makes floors slippery. If you must use wax, use non-skid floor wax.  Do not have throw rugs and other things on the floor that can make you trip. What can I do with my stairs?  Do not leave any items on the stairs.  Make sure that there are handrails on both sides of the stairs and use them. Fix handrails that are broken or loose. Make sure that handrails are as long as the stairways.  Check any carpeting to make sure that it is firmly attached to the stairs. Fix any carpet that is loose or worn.  Avoid having throw rugs at the top or bottom of the stairs. If you do have throw rugs, attach them to the floor with carpet tape.  Make sure that you have a light switch at the top of the stairs and the bottom of the stairs. If you do not have them, ask someone to add them for you. What else can I do to help prevent falls?  Wear shoes  that:  Do not have high heels.  Have rubber bottoms.  Are comfortable and fit you well.  Are closed at the toe. Do not wear sandals.  If you use a stepladder:  Make sure that it is fully opened. Do not climb a closed stepladder.  Make sure that both sides of the stepladder are locked into place.  Ask someone to hold it for you, if possible.  Clearly mark and make sure that you can see:  Any grab bars or handrails.  First and last steps.  Where the edge of each step is.  Use tools that help you move around (mobility aids) if they are needed. These include:  Canes.  Walkers.  Scooters.  Crutches.  Turn on the lights when you go into a dark area. Replace any light bulbs as soon as they burn out.  Set up your furniture so you have a clear path. Avoid moving your furniture around.  If any of your floors are uneven, fix them.  If there are any pets around you, be aware of where they are.  Review your medicines with your doctor. Some medicines can make you feel dizzy. This can increase your chance of falling. Ask your doctor what other things that you can do to help prevent falls. This information is not intended to replace advice given to you by your health care provider. Make sure you discuss any questions you have with your health care provider. Document Released: 11/26/2008 Document Revised: 07/08/2015 Document Reviewed: 03/06/2014 Elsevier Interactive Patient Education  2017 [employer].

## 2016-09-06 NOTE — Progress Notes (Signed)
Subjective:   Katherine [name] is a 76 y.o. female who presents for Medicare Annual (Subsequent) preventive examination.  Review of Systems:   Cardiac Risk Factors include: advanced age (>66men, >55 women);hypertension     Objective:     Vitals: BP (!) 169/87 (BP Location: Left Arm, Patient Position: Sitting)   Pulse 74   Temp 98.3 F (36.8 C)   Resp 19   Ht 5' (1.524 m)   Wt 233 lb 6.4 oz (105.9 kg)   BMI 45.58 kg/m   Body mass index is 45.58 kg/m.   Tobacco History  Smoking Status  . Light Tobacco Smoker  . Packs/day: 0.00  . Years: 52.00  . Types: Cigarettes  Smokeless Tobacco  . Never Used    Comment: smokes 2 cig a day      Ready to quit: Yes Counseling given: Yes   Past Medical History:  Diagnosis Date  . Angioedema   . Anxiety   . Anxiety and depression   . Chronic constipation   . Chronic kidney disease    stage 3  . COPD (chronic obstructive pulmonary disease) ([place])   . Gallstones   . GERD (gastroesophageal reflux disease)   . Hypertension   . Left ventricular hypertrophy   . Osteoarthritis   . Prurigo nodularis   . Tobacco abuse   . Vitamin D deficiency disease    Past Surgical History:  Procedure Laterality Date  . CHOLECYSTECTOMY    . 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 Alcohol abuse Mother   . Sickle cell anemia Daughter   . Hypertension Son   . Cancer Neg Hx   . COPD Neg Hx   . Diabetes Neg Hx   . Heart disease Neg Hx   . Stroke Neg Hx    History  Sexual Activity  . Sexual activity: Yes    Outpatient Encounter Prescriptions as of 09/06/2016  Medication Sig  . acetaminophen (TYLENOL) 650 MG CR tablet Take 650-1,300 mg by mouth every 8 (eight) hours as needed for pain.   [name] albuterol (PROVENTIL HFA) 108  (90 Base) MCG/ACT inhaler Inhale 2 puffs into the lungs every 4 (four) hours as needed for wheezing or shortness of breath.  [name] aspirin EC 81 MG tablet Take 81 mg by mouth daily.  . cetirizine (ZYRTEC) 10 MG tablet Take 0.5 tablets (5 mg total) by mouth at bedtime.  . cholecalciferol (VITAMIN D) 1000 units tablet Take 1,000 Units by mouth daily.  [name] FLUoxetine (PROZAC) 10 MG capsule Take 10 mg by mouth daily.  . fluticasone furoate-vilanterol (BREO ELLIPTA) 100-25 MCG/INH AEPB Inhale 1 puff into the lungs daily.  [name] gabapentin (NEURONTIN) 300 MG capsule Take 300 mg by mouth daily.  . hydrALAZINE (APRESOLINE) 25 MG tablet Take 1 tablet (25 mg total) by mouth 2 (two) times daily.  [name] ipratropium-albuterol (DUONEB) 0.5-2.5 (3) MG/3ML SOLN Take 3 mLs by nebulization every 6 (six) hours as needed.  . Lidocaine 0.5 % GEL Apply 4 times/day as needed  . metoprolol tartrate (LOPRESSOR) 25 MG tablet Take 0.5 tablets (12.5 mg total) by mouth 2 (two) times daily.  . montelukast (SINGULAIR) 10 MG tablet TAKE 1 TABLET(10 MG) BY MOUTH AT BEDTIME  . omeprazole (PRILOSEC) 40 MG capsule Take 40  mg by mouth daily.  [name] tiotropium (SPIRIVA HANDIHALER) 18 MCG inhalation capsule Place 1 capsule (18 mcg total) into inhaler and inhale daily.  . traMADol (ULTRAM) 50 MG tablet Take 1-2 tablets (50-100 mg total) by mouth every 6 (six) hours as needed.   No facility-administered encounter medications on file as of 09/06/2016.     Activities of Daily Living In your present state of health, do you have any difficulty performing the following activities: 09/06/2016 06/12/2016  Hearing? N N  Vision? Y N  Difficulty concentrating or making decisions? N N  Walking or climbing stairs? Y Y  Dressing or bathing? N N  Doing errands, shopping? N N  Preparing Food and eating ? N -  Using the Toilet? N -  In the past six months, have you accidently leaked urine? N -  Do you have problems with loss of bowel control? N -  Managing your  Medications? N -  Managing your Finances? N -  Housekeeping or managing your Housekeeping? N -  Some recent data might be hidden    Patient Care Team: [name], NP as PCP - General (Nurse Practitioner) Beverly Gust, MD as Consulting Physician (Otolaryngology) Clyde Canterbury, MD as Referring Physician (Otolaryngology) [name], MD as Consulting Physician (Pulmonary Disease) Hessie Knows, MD as Consulting Physician (Orthopedic Surgery)    Assessment:     Exercise Activities and Dietary recommendations Current Exercise Habits: Home exercise routine, Type of exercise: stretching, Time (Minutes): 30, Frequency (Times/Week): 3, Weekly Exercise (Minutes/Week): 90, Intensity: Mild  Goals    . Quit smoking / using tobacco    . Quit smoking / using tobacco          Smoking cessation discussed      Fall Risk Fall Risk  09/06/2016 06/12/2016 11/10/2015 08/12/2014  Falls in the past year? No No No No   Depression Screen PHQ 2/9 Scores 09/06/2016 06/12/2016 11/10/2015 09/07/2015  PHQ - 2 Score 0 2 0 0  PHQ- 9 Score - 7 - 3     Cognitive Function     6CIT Screen 09/06/2016  What Year? 4 points  What month? 0 points  What time? 0 points  Count back from 20 0 points  Months in reverse 4 points  Repeat phrase 6 points  Total Score 14    Immunization History  Administered Date(s) Administered  . Influenza-Unspecified 02/11/2014  . Pneumococcal Conjugate-13 09/06/2016   Screening Tests Health Maintenance  Topic Date Due  . MAMMOGRAM  10/04/1991  . DEXA SCAN  10/04/2006  . TETANUS/TDAP  09/06/2017 (Originally 10/03/1960)  . INFLUENZA VACCINE  09/13/2016  . PNA vac Low Risk Adult (2 of 2 - PPSV23) 09/06/2017  . COLONOSCOPY  02/13/2021      Plan:    I have personally reviewed and addressed the Medicare Annual Wellness questionnaire and have noted the following in the patient's chart:  A. Medical and social history B. Use of alcohol, tobacco or illicit drugs   C. Current medications and supplements D. Functional ability and status E.  Nutritional status F.  Physical activity G. Advance directives H. List of other physicians I.  Hospitalizations, surgeries, and ER visits in previous 12 months J.  [place] such as hearing and vision if needed, cognitive and depression L. Referrals and appointments  In addition, I have reviewed and discussed with patient certain preventive protocols, quality metrics, and best practice recommendations. A written personalized care plan for preventive services as well as general  preventive health recommendations were provided to patient.   Signed,  Tyler Aas, LPN Nurse Health Advisor   MD Recommendations:  As discussed patient presented with vertigo at today's visit. States it started last Monday and doesn't seem to present around a certain time. She will start the meclizine tonight and will take as needed as prescribed.She confirmed her follow up appt for 7/31. She will call if symptoms worsen before then.

## 2016-09-11 DIAGNOSIS — J449 Chronic obstructive pulmonary disease, unspecified: Secondary | ICD-10-CM | POA: Diagnosis not present

## 2016-09-12 ENCOUNTER — Ambulatory Visit (INDEPENDENT_AMBULATORY_CARE_PROVIDER_SITE_OTHER): Payer: Medicare HMO | Admitting: Unknown Physician Specialty

## 2016-09-12 ENCOUNTER — Encounter: Payer: Self-pay | Admitting: Unknown Physician Specialty

## 2016-09-12 VITALS — BP 118/76 | HR 83 | Temp 98.3°F | Wt 234.6 lb

## 2016-09-12 DIAGNOSIS — I517 Cardiomegaly: Secondary | ICD-10-CM

## 2016-09-12 DIAGNOSIS — E559 Vitamin D deficiency, unspecified: Secondary | ICD-10-CM | POA: Diagnosis not present

## 2016-09-12 DIAGNOSIS — N183 Chronic kidney disease, stage 3 unspecified: Secondary | ICD-10-CM

## 2016-09-12 DIAGNOSIS — R0602 Shortness of breath: Secondary | ICD-10-CM

## 2016-09-12 DIAGNOSIS — J432 Centrilobular emphysema: Secondary | ICD-10-CM | POA: Diagnosis not present

## 2016-09-12 DIAGNOSIS — M79645 Pain in left finger(s): Secondary | ICD-10-CM

## 2016-09-12 DIAGNOSIS — I119 Hypertensive heart disease without heart failure: Secondary | ICD-10-CM | POA: Diagnosis not present

## 2016-09-12 DIAGNOSIS — Z720 Tobacco use: Secondary | ICD-10-CM | POA: Diagnosis not present

## 2016-09-12 DIAGNOSIS — G8929 Other chronic pain: Secondary | ICD-10-CM

## 2016-09-12 DIAGNOSIS — M25562 Pain in left knee: Secondary | ICD-10-CM | POA: Diagnosis not present

## 2016-09-12 DIAGNOSIS — R5382 Chronic fatigue, unspecified: Secondary | ICD-10-CM | POA: Diagnosis not present

## 2016-09-12 DIAGNOSIS — I10 Essential (primary) hypertension: Secondary | ICD-10-CM | POA: Diagnosis not present

## 2016-09-12 DIAGNOSIS — Z66 Do not resuscitate: Secondary | ICD-10-CM | POA: Diagnosis not present

## 2016-09-12 HISTORY — DX: Pain in left finger(s): M79.645

## 2016-09-12 NOTE — Assessment & Plan Note (Signed)
Continue Tramadol.  Waiting for right TKA to be replaced

## 2016-09-12 NOTE — Assessment & Plan Note (Signed)
I have recommended absolute tobacco cessation. I have discussed various options available for assistance with tobacco cessation including over the counter methods (Nicotine gum, patch and lozenges). We also discussed prescription options (Chantix, Nicotine Inhaler / Nasal Spray). The patient is not interested in pursuing any prescription tobacco cessation options at this time.  Interested in OTC gum

## 2016-09-12 NOTE — Assessment & Plan Note (Signed)
Very sensitive to touch.  Will x-ray

## 2016-09-12 NOTE — Progress Notes (Signed)
BP 118/76   Pulse 83   Temp 98.3 F (36.8 C)   Wt 234 lb 9.6 oz (106.4 kg)   SpO2 97%   BMI 45.82 kg/m    Subjective:    Patient ID: Katherine [name], female    DOB: 1940/05/30, 76 y.o.   MRN: [id number]  HPI: Katherine [name] is a 76 y.o. female  Chief Complaint  Patient presents with  . Hypertension  . COPD  . Chronic Kidney Disease   Hypertension Using medications without difficulty Average home BPs about what they are here  No problems or lightheadedness No chest pain with exertion or shortness of breath No Edema  COPD Feels like she is dong better today.  On 2 L O2 Using Albuterol TID with every other day use of nebulizer.    CKD Appt next month with Nephrology  Left knee pain Related to OA.  Right nee replaced.  Takes daily Tramadol  Tired/poor appetite Depression screen [facility] 2/9 09/12/2016 09/06/2016 06/12/2016 11/10/2015 09/07/2015  Decreased Interest 0 0 2 0 0  Down, Depressed, Hopeless 0 0 0 0 0  PHQ - 2 Score 0 0 2 0 0  Altered sleeping 0 - 0 - 0  Tired, decreased energy 3 - 3 - 1  Change in appetite 3 - 2 - 2  Feeling bad or failure about yourself  0 - 0 - 0  Trouble concentrating 0 - 0 - 0  Moving slowly or fidgety/restless 0 - 0 - 0  Suicidal thoughts 0 - 0 - 0  PHQ-9 Score 6 - 7 - 3    Relevant past medical, surgical, family and social history reviewed and updated as indicated. Interim medical history since our last visit reviewed. Allergies and medications reviewed and updated.  Review of Systems  Per HPI unless specifically indicated above     Objective:    BP 118/76   Pulse 83   Temp 98.3 F (36.8 C)   Wt 234 lb 9.6 oz (106.4 kg)   SpO2 97%   BMI 45.82 kg/m   Wt Readings from Last 3 Encounters:  09/12/16 234 lb 9.6 oz (106.4 kg)  09/06/16 233 lb 6.4 oz (105.9 kg)  06/12/16 238 lb 12.8 oz (108.3 kg)    Physical Exam  Constitutional: She is oriented to person, place, and time. She appears well-developed and well-nourished. No  distress.  HENT:  Head: Normocephalic and atraumatic.  Eyes: Conjunctivae and lids are normal. Right eye exhibits no discharge. Left eye exhibits no discharge. No scleral icterus.  Neck: Normal range of motion. Neck supple. No JVD present. Carotid bruit is not present.  Cardiovascular: Normal rate, regular rhythm and normal heart sounds.   Pulmonary/Chest: Effort normal and breath sounds normal.  On O2 2 liters  Abdominal: Normal appearance. There is no splenomegaly or hepatomegaly.  Neurological: She is alert and oriented to person, place, and time.  Skin: Skin is warm, dry and intact. No rash noted. No pallor.  Psychiatric: She has a normal mood and affect. Her behavior is normal. Judgment and thought content normal.    Results for orders placed or performed in visit on 06/15/16  Comprehensive metabolic panel  Result Value Ref Range   Glucose 101 (H) 65 - 99 mg/dL   BUN 21 8 - 27 mg/dL   Creatinine, Ser 1.56 (H) 0.57 - 1.00 mg/dL   GFR calc non Af Amer 32 (L) >59 mL/min/1.73   GFR calc Af Amer 37 (L) >59 mL/min/1.73  BUN/Creatinine Ratio 13 12 - 28   Sodium 138 134 - 144 mmol/L   Potassium 4.4 3.5 - 5.2 mmol/L   Chloride 99 96 - 106 mmol/L   CO2 24 18 - 29 mmol/L   Calcium 9.6 8.7 - 10.3 mg/dL   Total Protein 7.1 6.0 - 8.5 g/dL   Albumin 3.7 3.5 - 4.8 g/dL   Globulin, Total 3.4 1.5 - 4.5 g/dL   Albumin/Globulin Ratio 1.1 (L) 1.2 - 2.2   Bilirubin Total 0.3 0.0 - 1.2 mg/dL   Alkaline Phosphatase 87 39 - 117 IU/L   AST 11 0 - 40 IU/L   ALT 6 0 - 32 IU/L  Lipid Panel Piccolo, Waived  Result Value Ref Range   Cholesterol Piccolo, Waived 148 <200 mg/dL   HDL Chol Piccolo, Waived 55 (L) >59 mg/dL   Triglycerides Piccolo,Waived 109 <150 mg/dL   Chol/HDL Ratio Piccolo,Waive 2.7 mg/dL   LDL Chol Calc Piccolo Waived 71 <100 mg/dL   VLDL Chol Calc Piccolo,Waive 22 <30 mg/dL      Assessment & Plan:   Problem List Items Addressed This Visit      Unprioritized   Chronic  fatigue   Relevant Orders   CBC with Differential/Platelet   TSH   Chronic kidney disease    Per Nephrology      COPD (chronic obstructive pulmonary disease) (HCC)    Stable, continue present medications. Does not want to be on a ventilator       DNR (do not resuscitate)    Also does not want ventilator support      Essential hypertension    Stable, continue present medications.        Hypertensive heart disease   Relevant Orders   Comprehensive metabolic panel   Left knee pain    Continue Tramadol.  Waiting for right TKA to be replaced      LVH (left ventricular hypertrophy)    Some ankle edema.  No weight gain      Pain in finger of left hand    Very sensitive to touch.  Will x-ray      Relevant Orders   DG Hand Complete Left   RESOLVED: SOB (shortness of breath) - Primary   Tobacco abuse     I have recommended absolute tobacco cessation. I have discussed various options available for assistance with tobacco cessation including over the counter methods (Nicotine gum, patch and lozenges). We also discussed prescription options (Chantix, Nicotine Inhaler / Nasal Spray). The patient is not interested in pursuing any prescription tobacco cessation options at this time.  Interested in OTC gum       Vitamin D deficiency disease   Relevant Orders   VITAMIN D 25 Hydroxy (Vit-D Deficiency, Fractures)    --------   Follow up plan: Return in about 6 months (around 03/15/2017).

## 2016-09-12 NOTE — Assessment & Plan Note (Signed)
Also does not want ventilator support

## 2016-09-12 NOTE — Assessment & Plan Note (Signed)
Some ankle edema.  No weight gain

## 2016-09-12 NOTE — Assessment & Plan Note (Signed)
Per Nephrology.

## 2016-09-12 NOTE — Assessment & Plan Note (Addendum)
Stable, continue present medications. Does not want to be on a ventilator

## 2016-09-12 NOTE — Assessment & Plan Note (Signed)
Stable, continue present medications.   

## 2016-09-13 ENCOUNTER — Encounter: Payer: Self-pay | Admitting: Unknown Physician Specialty

## 2016-09-13 LAB — COMPREHENSIVE METABOLIC PANEL
ALK PHOS: 88 IU/L (ref 39–117)
ALT: 11 IU/L (ref 0–32)
AST: 12 IU/L (ref 0–40)
Albumin/Globulin Ratio: 1.1 — ABNORMAL LOW (ref 1.2–2.2)
Albumin: 3.7 g/dL (ref 3.5–4.8)
BUN/Creatinine Ratio: 12 (ref 12–28)
BUN: 19 mg/dL (ref 8–27)
Bilirubin Total: 0.3 mg/dL (ref 0.0–1.2)
CALCIUM: 9.4 mg/dL (ref 8.7–10.3)
CO2: 24 mmol/L (ref 20–29)
CREATININE: 1.61 mg/dL — AB (ref 0.57–1.00)
Chloride: 103 mmol/L (ref 96–106)
GFR calc Af Amer: 36 mL/min/{1.73_m2} — ABNORMAL LOW (ref >59)
GFR, EST NON AFRICAN AMERICAN: 31 mL/min/{1.73_m2} — AB (ref >59)
GLUCOSE: 93 mg/dL (ref 65–99)
Globulin, Total: 3.4 g/dL (ref 1.5–4.5)
POTASSIUM: 4.7 mmol/L (ref 3.5–5.2)
Sodium: 141 mmol/L (ref 134–144)
Total Protein: 7.1 g/dL (ref 6.0–8.5)

## 2016-09-13 LAB — CBC WITH DIFFERENTIAL/PLATELET
BASOS ABS: 0 10*3/uL (ref 0.0–0.2)
Basos: 0 %
EOS (ABSOLUTE): 0.1 10*3/uL (ref 0.0–0.4)
Eos: 2 %
HEMOGLOBIN: 12 g/dL (ref 11.1–15.9)
Hematocrit: 38.2 % (ref 34.0–46.6)
IMMATURE GRANS (ABS): 0 10*3/uL (ref 0.0–0.1)
IMMATURE GRANULOCYTES: 0 %
LYMPHS: 31 %
Lymphocytes Absolute: 1.6 10*3/uL (ref 0.7–3.1)
MCH: 28.7 pg (ref 26.6–33.0)
MCHC: 31.4 g/dL — ABNORMAL LOW (ref 31.5–35.7)
MCV: 91 fL (ref 79–97)
MONOCYTES: 7 %
Monocytes Absolute: 0.4 10*3/uL (ref 0.1–0.9)
NEUTROS ABS: 3.2 10*3/uL (ref 1.4–7.0)
NEUTROS PCT: 60 %
Platelets: 279 10*3/uL (ref 150–379)
RBC: 4.18 x10E6/uL (ref 3.77–5.28)
RDW: 16 % — ABNORMAL HIGH (ref 12.3–15.4)
WBC: 5.3 10*3/uL (ref 3.4–10.8)

## 2016-09-13 LAB — VITAMIN D 25 HYDROXY (VIT D DEFICIENCY, FRACTURES): VIT D 25 HYDROXY: 41.3 ng/mL (ref 30.0–100.0)

## 2016-09-13 LAB — TSH: TSH: 1.61 u[IU]/mL (ref 0.450–4.500)

## 2016-09-18 ENCOUNTER — Other Ambulatory Visit: Payer: Self-pay | Admitting: Unknown Physician Specialty

## 2016-09-29 ENCOUNTER — Other Ambulatory Visit: Payer: Self-pay | Admitting: Unknown Physician Specialty

## 2016-10-02 ENCOUNTER — Other Ambulatory Visit: Payer: Self-pay | Admitting: Unknown Physician Specialty

## 2016-10-04 DIAGNOSIS — J449 Chronic obstructive pulmonary disease, unspecified: Secondary | ICD-10-CM | POA: Diagnosis not present

## 2016-10-10 DIAGNOSIS — I129 Hypertensive chronic kidney disease with stage 1 through stage 4 chronic kidney disease, or unspecified chronic kidney disease: Secondary | ICD-10-CM | POA: Diagnosis not present

## 2016-10-10 DIAGNOSIS — E875 Hyperkalemia: Secondary | ICD-10-CM | POA: Diagnosis not present

## 2016-10-10 DIAGNOSIS — N2581 Secondary hyperparathyroidism of renal origin: Secondary | ICD-10-CM | POA: Diagnosis not present

## 2016-10-10 DIAGNOSIS — N183 Chronic kidney disease, stage 3 (moderate): Secondary | ICD-10-CM | POA: Diagnosis not present

## 2016-10-10 DIAGNOSIS — R809 Proteinuria, unspecified: Secondary | ICD-10-CM | POA: Diagnosis not present

## 2016-10-12 DIAGNOSIS — J449 Chronic obstructive pulmonary disease, unspecified: Secondary | ICD-10-CM | POA: Diagnosis not present

## 2016-10-27 ENCOUNTER — Other Ambulatory Visit: Payer: Self-pay | Admitting: Family Medicine

## 2016-10-27 ENCOUNTER — Other Ambulatory Visit: Payer: Self-pay | Admitting: Unknown Physician Specialty

## 2016-10-30 ENCOUNTER — Other Ambulatory Visit: Payer: Self-pay | Admitting: Unknown Physician Specialty

## 2016-10-31 ENCOUNTER — Encounter: Payer: Self-pay | Admitting: Unknown Physician Specialty

## 2016-10-31 ENCOUNTER — Other Ambulatory Visit: Payer: Self-pay | Admitting: Family Medicine

## 2016-10-31 ENCOUNTER — Other Ambulatory Visit: Payer: Self-pay | Admitting: Unknown Physician Specialty

## 2016-10-31 NOTE — Telephone Encounter (Signed)
Your patient 

## 2016-11-04 DIAGNOSIS — J449 Chronic obstructive pulmonary disease, unspecified: Secondary | ICD-10-CM | POA: Diagnosis not present

## 2016-11-09 ENCOUNTER — Other Ambulatory Visit: Payer: Self-pay | Admitting: Unknown Physician Specialty

## 2016-11-12 DIAGNOSIS — J449 Chronic obstructive pulmonary disease, unspecified: Secondary | ICD-10-CM | POA: Diagnosis not present

## 2016-12-04 ENCOUNTER — Other Ambulatory Visit: Payer: Self-pay | Admitting: Family Medicine

## 2016-12-04 ENCOUNTER — Other Ambulatory Visit: Payer: Self-pay | Admitting: Unknown Physician Specialty

## 2016-12-04 DIAGNOSIS — J449 Chronic obstructive pulmonary disease, unspecified: Secondary | ICD-10-CM | POA: Diagnosis not present

## 2016-12-04 NOTE — Telephone Encounter (Signed)
Your patient 

## 2016-12-07 ENCOUNTER — Other Ambulatory Visit: Payer: Medicare HMO

## 2016-12-12 DIAGNOSIS — J449 Chronic obstructive pulmonary disease, unspecified: Secondary | ICD-10-CM | POA: Diagnosis not present

## 2017-01-04 DIAGNOSIS — J449 Chronic obstructive pulmonary disease, unspecified: Secondary | ICD-10-CM | POA: Diagnosis not present

## 2017-01-05 ENCOUNTER — Other Ambulatory Visit: Payer: Self-pay | Admitting: Unknown Physician Specialty

## 2017-01-08 ENCOUNTER — Other Ambulatory Visit: Payer: Self-pay | Admitting: Family Medicine

## 2017-01-12 DIAGNOSIS — J449 Chronic obstructive pulmonary disease, unspecified: Secondary | ICD-10-CM | POA: Diagnosis not present

## 2017-01-26 ENCOUNTER — Ambulatory Visit: Payer: Medicare HMO | Admitting: Pulmonary Disease

## 2017-01-29 ENCOUNTER — Other Ambulatory Visit: Payer: Self-pay | Admitting: Family Medicine

## 2017-01-30 ENCOUNTER — Other Ambulatory Visit: Payer: Self-pay | Admitting: Unknown Physician Specialty

## 2017-01-31 ENCOUNTER — Other Ambulatory Visit: Payer: Self-pay | Admitting: Unknown Physician Specialty

## 2017-02-03 DIAGNOSIS — J449 Chronic obstructive pulmonary disease, unspecified: Secondary | ICD-10-CM | POA: Diagnosis not present

## 2017-02-04 ENCOUNTER — Other Ambulatory Visit: Payer: Self-pay | Admitting: Unknown Physician Specialty

## 2017-02-08 ENCOUNTER — Other Ambulatory Visit: Payer: Self-pay | Admitting: Unknown Physician Specialty

## 2017-02-11 DIAGNOSIS — J449 Chronic obstructive pulmonary disease, unspecified: Secondary | ICD-10-CM | POA: Diagnosis not present

## 2017-02-19 ENCOUNTER — Ambulatory Visit: Payer: Medicare HMO | Admitting: Pulmonary Disease

## 2017-02-19 ENCOUNTER — Encounter: Payer: Self-pay | Admitting: Pulmonary Disease

## 2017-02-19 VITALS — BP 122/80 | HR 81 | Ht 60.0 in | Wt 239.0 lb

## 2017-02-19 DIAGNOSIS — E662 Morbid (severe) obesity with alveolar hypoventilation: Secondary | ICD-10-CM | POA: Diagnosis not present

## 2017-02-19 DIAGNOSIS — R942 Abnormal results of pulmonary function studies: Secondary | ICD-10-CM

## 2017-02-19 DIAGNOSIS — G4734 Idiopathic sleep related nonobstructive alveolar hypoventilation: Secondary | ICD-10-CM | POA: Diagnosis not present

## 2017-02-19 DIAGNOSIS — J9611 Chronic respiratory failure with hypoxia: Secondary | ICD-10-CM | POA: Diagnosis not present

## 2017-02-19 DIAGNOSIS — R0902 Hypoxemia: Secondary | ICD-10-CM

## 2017-02-19 DIAGNOSIS — F17200 Nicotine dependence, unspecified, uncomplicated: Secondary | ICD-10-CM | POA: Diagnosis not present

## 2017-02-19 NOTE — Patient Instructions (Signed)
Continue Breo, Spiriva, DuoNeb, albuterol as you are currently using Continue oxygen therapy with exertion and sleep We discussed smoking cessation.  It is imperative that she quit smoking altogether Continue to work on weight loss You may discontinue Singulair (montelukast) as I do not believe this medication is providing any benefit Follow-up in 4-6 months

## 2017-02-22 NOTE — Progress Notes (Signed)
PULMONARY OFFICE FOLLOW UP NOTE  Requesting MD/Service: [name], NP Date of initial consultation: 01/20/16 Reason for consultation:  Chronic hypoxemic respiratory failure, COPD, OHS  PT PROFILE: 77 y.o. F smoker referred for evaluation of chronic hypoxemic respiratory failure COPD, OHS.   DATA: 02/12/15 CT chest:No evidence of pulmonary embolism. No evidence of acute cardiopulmonary disease 06/21/15 spirometry: FVC: 1.82 L (85 %pred), FEV1:  1.38 L (83 %pred), FEV1/FVC: 75%   INTERVAL: No major events  SUBJ:  This is a routine reevaluation.  Last seen 05/18/16.  She continues to smoke 2-3 cigarettes/day.  She is wearing oxygen with sleep and exertion.  She denies cough and sputum production.  She denies hemoptysis and chest pain.  She has had no recent fevers.  She does have chronic, intermittent left ankle edema.  Exertional dyspnea has not changed. she is relatively sedentary.  Vitals:   02/19/17 0948 02/19/17 0955  BP:  122/80  Pulse:  81  SpO2:  99%  Weight: 108.4 kg (239 lb)   Height: 5' (1.524 m)   2 LPM [place] pulse   EXAM:  Gen: Obese, No overt respiratory distress HEENT: NCAT, sclera white, oropharynx normal Neck:  no evident LAN, JVD Lungs: Mildly diminished without adventitious sounds Cardiovascular: distant HS reg rhythm, no murmurs noted Abdomen: Obese, soft, nontender, normal BS Ext: without clubbing, cyanosis.  Trace left ankle edema Neuro: grossly intact Skin: Limited exam, no lesions noted  DATA:   BMP Latest Ref Rng & Units 09/12/2016 06/15/2016 03/13/2016  Glucose 65 - 99 mg/dL 93 101(H) 102(H)  BUN 8 - 27 mg/dL 19 21 21   Creatinine 0.57 - 1.00 mg/dL 1.61(H) 1.56(H) 1.73(H)  BUN/Creat Ratio 12 - 28 12 13 12   Sodium 134 - 144 mmol/L 141 138 147(H)  Potassium 3.5 - 5.2 mmol/L 4.7 4.4 5.6(H)  Chloride 96 - 106 mmol/L 103 99 106  CO2 20 - 29 mmol/L 24 24 23   Calcium 8.7 - 10.3 mg/dL 9.4 9.6 9.5    CBC Latest Ref Rng & Units 09/12/2016 03/13/2016  02/10/2016  WBC 3.4 - 10.8 x10E3/uL 5.3 6.0 9.5  Hemoglobin 11.1 - 15.9 g/dL 12.0 10.8(L) 10.4(L)  Hematocrit 34.0 - 46.6 % 38.2 34.4 31.3(L)  Platelets 150 - 379 x10E3/uL 279 318 205    CXR: No new film    IMPRESSION:     ICD-10-CM   1. Chronic hypoxemic respiratory failure (HCC) J96.11   2. Obesity hypoventilation syndrome (HCC) E66.2   3. Smoker F17.200   4. Nocturnal hypoxemia G47.34   5. Exercise hypoxemia R09.02   6. Restrictive pattern present on pulmonary function testing R94.2      PLAN:  I again emphasized the importance of smoking cessation Continue Breo, Spiriva, DuoNeb, albuterol as currently Continue oxygen therapy with exertion and sleep We again discussed the benefits of weight loss including strategies emphasizing a low glycemic index She may discontinue Singulair (montelukast) as this medication is providing  minimal benefit Follow-up in 4-6 months   Merton Border, MD PCCM service Mobile [phone] Pager [phone] 02/22/2017 8:41 PM

## 2017-03-01 ENCOUNTER — Encounter: Payer: Self-pay | Admitting: Family Medicine

## 2017-03-01 ENCOUNTER — Ambulatory Visit (INDEPENDENT_AMBULATORY_CARE_PROVIDER_SITE_OTHER): Payer: Medicare HMO | Admitting: Family Medicine

## 2017-03-01 VITALS — BP 151/92 | HR 99 | Temp 98.6°F | Wt 235.2 lb

## 2017-03-01 DIAGNOSIS — J441 Chronic obstructive pulmonary disease with (acute) exacerbation: Secondary | ICD-10-CM

## 2017-03-01 MED ORDER — ALBUTEROL SULFATE (2.5 MG/3ML) 0.083% IN NEBU
2.5000 mg | INHALATION_SOLUTION | Freq: Once | RESPIRATORY_TRACT | Status: AC
  Administered 2017-03-01: 2.5 mg via RESPIRATORY_TRACT

## 2017-03-01 MED ORDER — AZITHROMYCIN 250 MG PO TABS: ORAL_TABLET | ORAL | 0 refills | Status: DC

## 2017-03-01 MED ORDER — PREDNISONE 10 MG PO TABS: ORAL_TABLET | ORAL | 0 refills | Status: DC

## 2017-03-01 MED ORDER — BENZONATATE 200 MG PO CAPS: 200.0000 mg | ORAL_CAPSULE | Freq: Two times a day (BID) | ORAL | 0 refills | Status: DC | PRN

## 2017-03-01 MED ORDER — HYDROCOD POLST-CPM POLST ER 10-8 MG/5ML PO SUER: 5.0000 mL | Freq: Every evening | ORAL | 0 refills | Status: DC | PRN

## 2017-03-01 NOTE — Progress Notes (Signed)
BP (!) 151/92 (BP Location: Left Arm, Patient Position: Sitting, Cuff Size: Large)   Pulse 99   Temp 98.6 F (37 C)   Wt 235 lb 4 oz (106.7 kg)   SpO2 100%   BMI 45.94 kg/m    Subjective:    Patient ID: Katherine [name], female    DOB: [birth year], 77 y.o.   MRN: [id number]  HPI: Katherine [name] is a 77 y.o. female  Chief Complaint  Patient presents with  . URI   UPPER RESPIRATORY TRACT INFECTION Duration: URI Worst symptom: coughing, pain under her rib Fever: yes Cough: yes Shortness of breath: yes Wheezing: yes Chest pain: yes, with cough Chest tightness: yes Chest congestion: yes Nasal congestion: yes Runny nose: no Post nasal drip: no Sneezing: no Sore throat: no Swollen glands: no Sinus pressure: no Headache: no Face pain: no Toothache: no Ear pain: no  Ear pressure: no  Eyes red/itching:no Eye drainage/crusting: yes  Vomiting: no Rash: no Fatigue: yes Sick contacts: yes Strep contacts: no  Context: better Recurrent sinusitis: no Relief with OTC cold/cough medications: no  Treatments attempted: cold/sinusn   Relevant past medical, surgical, family and social history reviewed and updated as indicated. Interim medical history since our last visit reviewed. Allergies and medications reviewed and updated.  Review of Systems  Constitutional: Positive for fatigue and fever. Negative for activity change, appetite change, chills, diaphoresis and unexpected weight change.  HENT: Positive for congestion, postnasal drip, rhinorrhea and sinus pressure. Negative for dental problem, drooling, ear discharge, ear pain, facial swelling, hearing loss, mouth sores, nosebleeds, sinus pain, sneezing, sore throat, tinnitus, trouble swallowing and voice change.   Respiratory: Positive for cough, chest tightness, shortness of breath and wheezing. Negative for apnea, choking and stridor.   Cardiovascular: Negative.   Psychiatric/Behavioral: Negative.     Per HPI unless  specifically indicated above     Objective:    BP (!) 151/92 (BP Location: Left Arm, Patient Position: Sitting, Cuff Size: Large)   Pulse 99   Temp 98.6 F (37 C)   Wt 235 lb 4 oz (106.7 kg)   SpO2 100%   BMI 45.94 kg/m   Wt Readings from Last 3 Encounters:  03/01/17 235 lb 4 oz (106.7 kg)  02/19/17 239 lb (108.4 kg)  09/12/16 234 lb 9.6 oz (106.4 kg)    Physical Exam  Constitutional: She is oriented to person, place, and time. She appears well-developed and well-nourished. No distress.  HENT:  Head: Normocephalic and atraumatic.  Right Ear: Hearing and external ear normal.  Left Ear: Hearing and external ear normal.  Nose: Nose normal.  Mouth/Throat: Oropharynx is clear and moist. No oropharyngeal exudate.  Eyes: Conjunctivae, EOM and lids are normal. Pupils are equal, round, and reactive to light. Right eye exhibits no discharge. Left eye exhibits no discharge. No scleral icterus.  Neck: Normal range of motion. Neck supple. No JVD present. No tracheal deviation present. No thyromegaly present.  Cardiovascular: Normal rate, regular rhythm, normal heart sounds and intact distal pulses. Exam reveals no gallop and no friction rub.  No murmur heard. Pulmonary/Chest: Effort normal. No stridor. No respiratory distress. She has wheezes in the right upper field, the right middle field, the right lower field, the left upper field, the left middle field and the left lower field. She has rhonchi in the right upper field, the right middle field, the right lower field, the left middle field and the left lower field. She has no rales. She exhibits no tenderness.  Musculoskeletal: Normal range of motion.  Lymphadenopathy:    She has no cervical adenopathy.  Neurological: She is alert and oriented to person, place, and time.  Skin: Skin is warm, dry and intact. No rash noted. She is not diaphoretic. No erythema. No pallor.  Psychiatric: She has a normal mood and affect. Her speech is normal and  behavior is normal. Judgment and thought content normal. Cognition and memory are normal.  Nursing note and vitals reviewed.   Results for orders placed or performed in visit on 09/12/16  CBC with Differential/Platelet  Result Value Ref Range   WBC 5.3 3.4 - 10.8 x10E3/uL   RBC 4.18 3.77 - 5.28 x10E6/uL   Hemoglobin 12.0 11.1 - 15.9 g/dL   Hematocrit 38.2 34.0 - 46.6 %   MCV 91 79 - 97 fL   MCH 28.7 26.6 - 33.0 pg   MCHC 31.4 (L) 31.5 - 35.7 g/dL   RDW 16.0 (H) 12.3 - 15.4 %   Platelets 279 150 - 379 x10E3/uL   Neutrophils 60 Not Estab. %   Lymphs 31 Not Estab. %   Monocytes 7 Not Estab. %   Eos 2 Not Estab. %   Basos 0 Not Estab. %   Neutrophils Absolute 3.2 1.4 - 7.0 x10E3/uL   Lymphocytes Absolute 1.6 0.7 - 3.1 x10E3/uL   Monocytes Absolute 0.4 0.1 - 0.9 x10E3/uL   EOS (ABSOLUTE) 0.1 0.0 - 0.4 x10E3/uL   Basophils Absolute 0.0 0.0 - 0.2 x10E3/uL   Immature Granulocytes 0 Not Estab. %   Immature Grans (Abs) 0.0 0.0 - 0.1 x10E3/uL  TSH  Result Value Ref Range   TSH 1.610 0.450 - 4.500 uIU/mL  VITAMIN D 25 Hydroxy (Vit-D Deficiency, Fractures)  Result Value Ref Range   Vit D, 25-Hydroxy 41.3 30.0 - 100.0 ng/mL  Comprehensive metabolic panel  Result Value Ref Range   Glucose 93 65 - 99 mg/dL   BUN 19 8 - 27 mg/dL   Creatinine, Ser 1.61 (H) 0.57 - 1.00 mg/dL   GFR calc non Af Amer 31 (L) >59 mL/min/1.73   GFR calc Af Amer 36 (L) >59 mL/min/1.73   BUN/Creatinine Ratio 12 12 - 28   Sodium 141 134 - 144 mmol/L   Potassium 4.7 3.5 - 5.2 mmol/L   Chloride 103 96 - 106 mmol/L   CO2 24 20 - 29 mmol/L   Calcium 9.4 8.7 - 10.3 mg/dL   Total Protein 7.1 6.0 - 8.5 g/dL   Albumin 3.7 3.5 - 4.8 g/dL   Globulin, Total 3.4 1.5 - 4.5 g/dL   Albumin/Globulin Ratio 1.1 (L) 1.2 - 2.2   Bilirubin Total 0.3 0.0 - 1.2 mg/dL   Alkaline Phosphatase 88 39 - 117 IU/L   AST 12 0 - 40 IU/L   ALT 11 0 - 32 IU/L      Assessment & Plan:   Problem List Items Addressed This Visit    None      Visit Diagnoses    COPD exacerbation ([place])    -  Primary   Will treat with prednisone and z-pack, tessalon and tussionex. Recheck lungs at follow up with PCP on 1/30. Call with any concerns.    Relevant Medications   albuterol (PROVENTIL) (2.5 MG/3ML) 0.083% nebulizer solution 2.5 mg (Completed)   chlorpheniramine-HYDROcodone (TUSSIONEX PENNKINETIC ER) 10-8 MG/5ML SUER   predniSONE (DELTASONE) 10 MG tablet   azithromycin (ZITHROMAX) 250 MG tablet   benzonatate (TESSALON) 200 MG capsule  Follow up plan: Return As scheduled.

## 2017-03-06 DIAGNOSIS — J449 Chronic obstructive pulmonary disease, unspecified: Secondary | ICD-10-CM | POA: Diagnosis not present

## 2017-03-14 ENCOUNTER — Ambulatory Visit: Payer: Medicare HMO | Admitting: Unknown Physician Specialty

## 2017-03-14 DIAGNOSIS — J449 Chronic obstructive pulmonary disease, unspecified: Secondary | ICD-10-CM | POA: Diagnosis not present

## 2017-03-16 ENCOUNTER — Other Ambulatory Visit: Payer: Self-pay | Admitting: Unknown Physician Specialty

## 2017-03-21 ENCOUNTER — Encounter: Payer: Self-pay | Admitting: Unknown Physician Specialty

## 2017-03-21 ENCOUNTER — Telehealth: Payer: Self-pay | Admitting: *Deleted

## 2017-03-21 ENCOUNTER — Ambulatory Visit (INDEPENDENT_AMBULATORY_CARE_PROVIDER_SITE_OTHER): Payer: Medicare HMO | Admitting: Unknown Physician Specialty

## 2017-03-21 VITALS — BP 116/79 | HR 88 | Temp 98.6°F | Wt 242.2 lb

## 2017-03-21 DIAGNOSIS — I10 Essential (primary) hypertension: Secondary | ICD-10-CM | POA: Diagnosis not present

## 2017-03-21 DIAGNOSIS — R059 Cough, unspecified: Secondary | ICD-10-CM

## 2017-03-21 DIAGNOSIS — R05 Cough: Secondary | ICD-10-CM

## 2017-03-21 DIAGNOSIS — L853 Xerosis cutis: Secondary | ICD-10-CM | POA: Diagnosis not present

## 2017-03-21 DIAGNOSIS — M79641 Pain in right hand: Secondary | ICD-10-CM

## 2017-03-21 DIAGNOSIS — M79642 Pain in left hand: Secondary | ICD-10-CM

## 2017-03-21 DIAGNOSIS — J432 Centrilobular emphysema: Secondary | ICD-10-CM

## 2017-03-21 DIAGNOSIS — Z7189 Other specified counseling: Secondary | ICD-10-CM

## 2017-03-21 DIAGNOSIS — I2510 Atherosclerotic heart disease of native coronary artery without angina pectoris: Secondary | ICD-10-CM

## 2017-03-21 DIAGNOSIS — M79643 Pain in unspecified hand: Secondary | ICD-10-CM | POA: Insufficient documentation

## 2017-03-21 DIAGNOSIS — N183 Chronic kidney disease, stage 3 unspecified: Secondary | ICD-10-CM

## 2017-03-21 DIAGNOSIS — Z87891 Personal history of nicotine dependence: Secondary | ICD-10-CM

## 2017-03-21 DIAGNOSIS — Z122 Encounter for screening for malignant neoplasm of respiratory organs: Secondary | ICD-10-CM

## 2017-03-21 HISTORY — DX: Pain in right hand: M79.641

## 2017-03-21 MED ORDER — PREDNISONE 20 MG PO TABS: 40.0000 mg | ORAL_TABLET | Freq: Every day | ORAL | 0 refills | Status: DC

## 2017-03-21 NOTE — Assessment & Plan Note (Signed)
Refusing statins for now.  Does not want to see cardiologist at this time

## 2017-03-21 NOTE — Progress Notes (Signed)
BP 116/79   Pulse 88   Temp 98.6 F (37 C) (Oral)   Wt 242 lb 3.2 oz (109.9 kg)   SpO2 95%   BMI 47.30 kg/m    Subjective:    Patient ID: Katherine [name], female    DOB: 1940/12/23, 77 y.o.   MRN: [id number]  HPI: Katherine [name] is a 77 y.o. female  Chief Complaint  Patient presents with  . COPD  . Hypertension  . URI    pt states she is still having congestion and cough from 03/01/17 visit, finished all medications   Pt here with her husband  Cough/COPD See last note on 1/17.  Presented with a cough.  Saw Dr. Wynetta Emery and given Prednisone and a Z pack.  She has not felt any improvement.  Olivia Mackie in the AM and Breo in the PM.  Using a nebulizer just once in a while  CKD Seeing Nephrology every 6 months   Left knee pain Does "so so" on Tramadol .  Pain related to OA.  Right knee replaced  Finger pain Left pinkie finger red and swollen.  Finds it's making her whole had swollen.  Never got the recommended x-ray due to transportation issues.    Hypertension Using medications without difficulty Average home BPs   No problems or lightheadedness No chest pain with exertion or shortness of breath No Edema   Relevant past medical, surgical, family and social history reviewed and updated as indicated. Interim medical history since our last visit reviewed. Allergies and medications reviewed and updated.  Review of Systems  Skin:       Has dry skin and Urticaria.  Not using OTC    Per HPI unless specifically indicated above     Objective:    BP 116/79   Pulse 88   Temp 98.6 F (37 C) (Oral)   Wt 242 lb 3.2 oz (109.9 kg)   SpO2 95%   BMI 47.30 kg/m   Wt Readings from Last 3 Encounters:  03/21/17 242 lb 3.2 oz (109.9 kg)  03/01/17 235 lb 4 oz (106.7 kg)  02/19/17 239 lb (108.4 kg)    Physical Exam  Constitutional: She is oriented to person, place, and time. She appears well-developed and well-nourished. No distress.  HENT:  Head: Normocephalic and  atraumatic.  Eyes: Conjunctivae and lids are normal. Right eye exhibits no discharge. Left eye exhibits no discharge. No scleral icterus.  Neck: Normal range of motion. Neck supple. No JVD present. Carotid bruit is not present.  Cardiovascular: Normal rate, regular rhythm and normal heart sounds.  Pulmonary/Chest: She has decreased breath sounds.  Abdominal: Normal appearance. There is no splenomegaly or hepatomegaly.  Musculoskeletal: Normal range of motion.  Neurological: She is alert and oriented to person, place, and time.  Skin: Skin is warm, dry and intact. No rash noted. No pallor.  Psychiatric: She has a normal mood and affect. Her behavior is normal. Judgment and thought content normal.    Results for orders placed or performed in visit on 09/12/16  CBC with Differential/Platelet  Result Value Ref Range   WBC 5.3 3.4 - 10.8 x10E3/uL   RBC 4.18 3.77 - 5.28 x10E6/uL   Hemoglobin 12.0 11.1 - 15.9 g/dL   Hematocrit 38.2 34.0 - 46.6 %   MCV 91 79 - 97 fL   MCH 28.7 26.6 - 33.0 pg   MCHC 31.4 (L) 31.5 - 35.7 g/dL   RDW 16.0 (H) 12.3 - 15.4 %   Platelets  279 150 - 379 x10E3/uL   Neutrophils 60 Not Estab. %   Lymphs 31 Not Estab. %   Monocytes 7 Not Estab. %   Eos 2 Not Estab. %   Basos 0 Not Estab. %   Neutrophils Absolute 3.2 1.4 - 7.0 x10E3/uL   Lymphocytes Absolute 1.6 0.7 - 3.1 x10E3/uL   Monocytes Absolute 0.4 0.1 - 0.9 x10E3/uL   EOS (ABSOLUTE) 0.1 0.0 - 0.4 x10E3/uL   Basophils Absolute 0.0 0.0 - 0.2 x10E3/uL   Immature Granulocytes 0 Not Estab. %   Immature Grans (Abs) 0.0 0.0 - 0.1 x10E3/uL  TSH  Result Value Ref Range   TSH 1.610 0.450 - 4.500 uIU/mL  VITAMIN D 25 Hydroxy (Vit-D Deficiency, Fractures)  Result Value Ref Range   Vit D, 25-Hydroxy 41.3 30.0 - 100.0 ng/mL  Comprehensive metabolic panel  Result Value Ref Range   Glucose 93 65 - 99 mg/dL   BUN 19 8 - 27 mg/dL   Creatinine, Ser 1.61 (H) 0.57 - 1.00 mg/dL   GFR calc non Af Amer 31 (L) >59  mL/min/1.73   GFR calc Af Amer 36 (L) >59 mL/min/1.73   BUN/Creatinine Ratio 12 12 - 28   Sodium 141 134 - 144 mmol/L   Potassium 4.7 3.5 - 5.2 mmol/L   Chloride 103 96 - 106 mmol/L   CO2 24 20 - 29 mmol/L   Calcium 9.4 8.7 - 10.3 mg/dL   Total Protein 7.1 6.0 - 8.5 g/dL   Albumin 3.7 3.5 - 4.8 g/dL   Globulin, Total 3.4 1.5 - 4.5 g/dL   Albumin/Globulin Ratio 1.1 (L) 1.2 - 2.2   Bilirubin Total 0.3 0.0 - 1.2 mg/dL   Alkaline Phosphatase 88 39 - 117 IU/L   AST 12 0 - 40 IU/L   ALT 11 0 - 32 IU/L      Assessment & Plan:   Problem List Items Addressed This Visit      Unprioritized   Advanced care planning/counseling discussion    Patient and husband here for discussion.  Discussed that she does not want to be maintained on life support including dialysis.  Temporary respirator assistance may be OK.  No feeding tubes.  Reiterated DNR status.  Has AD forms and planning on filling out.  Length of discussion 20 minutes.        Chronic kidney disease    Seeing Nephrology.  Check GFR today      Relevant Orders   Comprehensive metabolic panel   CBC with Differential/Platelet   Uric acid   COPD (chronic obstructive pulmonary disease) (HCC)    Chronic exacerbation.  Chest x-ray.  Low dose CT scan.  Increase nebulizer to 3 times/day.  Follow chest x-ray results.  Seeing Dr. Raul Del next month.        Relevant Medications   predniSONE (DELTASONE) 20 MG tablet   Coronary artery disease    Refusing statins for now.  Does not want to see cardiologist at this time      Relevant Orders   Lipid Panel w/o Chol/HDL Ratio   Dry skin    Eucerin cream Aquaphor  Put on after shower       Essential hypertension    Stable, continue present medications.        Relevant Orders   Comprehensive metabolic panel   Uric acid   Hand pain    Left hand pain with pinkie swollen.  Check Uric Acid      Morbid obesity ([place])  Discussed calorie restriction       Other Visit Diagnoses      Cough    -  Primary   Relevant Orders   DG Chest 2 View      Rx for Prednisone to see if it helps breathing and hand pain  Follow up plan: Return in about 1 week (around 03/28/2017).

## 2017-03-21 NOTE — Assessment & Plan Note (Signed)
Eucerin cream Aquaphor  Put on after shower

## 2017-03-21 NOTE — Assessment & Plan Note (Signed)
Left hand pain with pinkie swollen.  Check Uric Acid

## 2017-03-21 NOTE — Telephone Encounter (Signed)
Received referral for initial lung cancer screening scan. Contacted patient and obtained smoking history,(current, 30 pack year) as well as answering questions related to screening process. Patient denies signs of lung cancer such as weight loss or hemoptysis. Patient denies comorbidity that would prevent curative treatment if lung cancer were found. Patient is scheduled for shared decision making visit and CT scan on 04/12/17.

## 2017-03-21 NOTE — Patient Instructions (Signed)
For dry skin:   Eucerin cream Aquaphor  Put on after shower

## 2017-03-21 NOTE — Assessment & Plan Note (Signed)
Seeing Nephrology.  Check GFR today

## 2017-03-21 NOTE — Assessment & Plan Note (Signed)
Stable, continue present medications.   

## 2017-03-21 NOTE — Assessment & Plan Note (Signed)
Patient and husband here for discussion.  Discussed that she does not want to be maintained on life support including dialysis.  Temporary respirator assistance may be OK.  No feeding tubes.  Reiterated DNR status.  Has AD forms and planning on filling out.  Length of discussion 20 minutes.

## 2017-03-21 NOTE — Assessment & Plan Note (Signed)
Discussed calorie restriction

## 2017-03-21 NOTE — Assessment & Plan Note (Signed)
Chronic exacerbation.  Chest x-ray.  Low dose CT scan.  Increase nebulizer to 3 times/day.  Follow chest x-ray results.  Seeing Dr. Raul Del next month.

## 2017-03-22 ENCOUNTER — Encounter: Payer: Self-pay | Admitting: Unknown Physician Specialty

## 2017-03-22 LAB — CBC WITH DIFFERENTIAL/PLATELET
BASOS ABS: 0 10*3/uL (ref 0.0–0.2)
Basos: 0 %
EOS (ABSOLUTE): 0.1 10*3/uL (ref 0.0–0.4)
Eos: 3 %
Hematocrit: 39.6 % (ref 34.0–46.6)
Hemoglobin: 12.3 g/dL (ref 11.1–15.9)
Immature Grans (Abs): 0 10*3/uL (ref 0.0–0.1)
Immature Granulocytes: 0 %
LYMPHS ABS: 1.5 10*3/uL (ref 0.7–3.1)
Lymphs: 30 %
MCH: 30.2 pg (ref 26.6–33.0)
MCHC: 31.1 g/dL — AB (ref 31.5–35.7)
MCV: 97 fL (ref 79–97)
MONOS ABS: 0.5 10*3/uL (ref 0.1–0.9)
Monocytes: 10 %
Neutrophils Absolute: 2.8 10*3/uL (ref 1.4–7.0)
Neutrophils: 57 %
PLATELETS: 265 10*3/uL (ref 150–379)
RBC: 4.07 x10E6/uL (ref 3.77–5.28)
RDW: 15.3 % (ref 12.3–15.4)
WBC: 5 10*3/uL (ref 3.4–10.8)

## 2017-03-22 LAB — COMPREHENSIVE METABOLIC PANEL
A/G RATIO: 1.6 (ref 1.2–2.2)
ALT: 14 IU/L (ref 0–32)
AST: 11 IU/L (ref 0–40)
Albumin: 4.1 g/dL (ref 3.5–4.8)
Alkaline Phosphatase: 89 IU/L (ref 39–117)
BILIRUBIN TOTAL: 0.2 mg/dL (ref 0.0–1.2)
BUN/Creatinine Ratio: 12 (ref 12–28)
BUN: 19 mg/dL (ref 8–27)
CALCIUM: 10 mg/dL (ref 8.7–10.3)
CO2: 24 mmol/L (ref 20–29)
Chloride: 104 mmol/L (ref 96–106)
Creatinine, Ser: 1.64 mg/dL — ABNORMAL HIGH (ref 0.57–1.00)
GFR, EST AFRICAN AMERICAN: 35 mL/min/{1.73_m2} — AB (ref >59)
GFR, EST NON AFRICAN AMERICAN: 30 mL/min/{1.73_m2} — AB (ref >59)
Globulin, Total: 2.6 g/dL (ref 1.5–4.5)
Glucose: 80 mg/dL (ref 65–99)
POTASSIUM: 5.1 mmol/L (ref 3.5–5.2)
SODIUM: 142 mmol/L (ref 134–144)
TOTAL PROTEIN: 6.7 g/dL (ref 6.0–8.5)

## 2017-03-22 LAB — URIC ACID: Uric Acid: 7.3 mg/dL — ABNORMAL HIGH (ref 2.5–7.1)

## 2017-03-22 LAB — LIPID PANEL W/O CHOL/HDL RATIO
CHOLESTEROL TOTAL: 157 mg/dL (ref 100–199)
HDL: 42 mg/dL (ref >39)
LDL Calculated: 85 mg/dL (ref 0–99)
TRIGLYCERIDES: 148 mg/dL (ref 0–149)
VLDL Cholesterol Cal: 30 mg/dL (ref 5–40)

## 2017-03-26 ENCOUNTER — Other Ambulatory Visit: Payer: Self-pay | Admitting: Unknown Physician Specialty

## 2017-03-27 ENCOUNTER — Telehealth: Payer: Self-pay | Admitting: Unknown Physician Specialty

## 2017-03-27 ENCOUNTER — Ambulatory Visit (INDEPENDENT_AMBULATORY_CARE_PROVIDER_SITE_OTHER): Payer: Medicare HMO | Admitting: Unknown Physician Specialty

## 2017-03-27 ENCOUNTER — Encounter: Payer: Self-pay | Admitting: Unknown Physician Specialty

## 2017-03-27 VITALS — BP 138/84 | HR 93 | Temp 98.4°F | Wt 244.2 lb

## 2017-03-27 DIAGNOSIS — R509 Fever, unspecified: Secondary | ICD-10-CM | POA: Diagnosis not present

## 2017-03-27 DIAGNOSIS — J42 Unspecified chronic bronchitis: Secondary | ICD-10-CM

## 2017-03-27 DIAGNOSIS — J432 Centrilobular emphysema: Secondary | ICD-10-CM | POA: Diagnosis not present

## 2017-03-27 DIAGNOSIS — J209 Acute bronchitis, unspecified: Secondary | ICD-10-CM

## 2017-03-27 LAB — CBC WITH DIFFERENTIAL/PLATELET
HEMATOCRIT: 37.9 % (ref 34.0–46.6)
HEMOGLOBIN: 12.6 g/dL (ref 11.1–15.9)
LYMPHS ABS: 2.6 10*3/uL (ref 0.7–3.1)
LYMPHS: 35 %
MCH: 31.6 pg (ref 26.6–33.0)
MCHC: 33.2 g/dL (ref 31.5–35.7)
MCV: 95 fL (ref 79–97)
MID (Absolute): 0.5 10*3/uL (ref 0.1–1.6)
MID: 7 %
NEUTROS PCT: 58 %
Neutrophils Absolute: 4.3 10*3/uL (ref 1.4–7.0)
Platelets: 252 10*3/uL (ref 150–379)
RBC: 3.99 x10E6/uL (ref 3.77–5.28)
RDW: 16.2 % — AB (ref 12.3–15.4)
WBC: 7.4 10*3/uL (ref 3.4–10.8)

## 2017-03-27 MED ORDER — DOXYCYCLINE HYCLATE 100 MG PO TABS: 100.0000 mg | ORAL_TABLET | Freq: Two times a day (BID) | ORAL | 0 refills | Status: DC

## 2017-03-27 MED ORDER — PREDNISONE 20 MG PO TABS: 40.0000 mg | ORAL_TABLET | Freq: Every day | ORAL | 0 refills | Status: DC

## 2017-03-27 NOTE — Telephone Encounter (Signed)
Copied from [place]. Topic: General - Other >> Mar 27, 2017 10:37 AM Carolyn Stare wrote:  Pt husband call to give [name] pt lung doctors name Merton Border   [phone]

## 2017-03-27 NOTE — Progress Notes (Signed)
BP 138/84   Pulse 93   Temp 98.4 F (36.9 C) (Oral)   Wt 244 lb 3.2 oz (110.8 kg)   SpO2 93%   BMI 47.69 kg/m    Subjective:    Patient ID: Katherine [name], female    DOB: 1940/07/15, 77 y.o.   MRN: [id number]  HPI: Loreal Schuessler is a 77 y.o. female  Chief Complaint  Patient presents with  . Cough    1 week f/up- pt states the prednisone helped some    Pt is here for f/u of breathing and finger pain.  She found Prednisone not helpful at all for her hand pain and just a little helpful for breathing.  Taking Spiriva, Breo, and nebulizer TID.  Going back to see pulmonary next month.  [facility] are not sure [facility]  She continue to be SOB following a head cold.  No help with Zithromax.  Fever in the AM She has not gotten the chest x-ray ordered last week.  In general she has DOE.  No ankle swelling.  Continued cough with thick mucous.     Relevant past medical, surgical, family and social history reviewed and updated as indicated. Interim medical history since our last visit reviewed. Allergies and medications reviewed and updated.  Review of Systems  Constitutional: Positive for fatigue and fever. Negative for unexpected weight change.  HENT: Positive for congestion.   Respiratory: Positive for cough and shortness of breath.   Cardiovascular: Negative.   Gastrointestinal: Negative.   Genitourinary: Negative.     Per HPI unless specifically indicated above     Objective:    BP 138/84   Pulse 93   Temp 98.4 F (36.9 C) (Oral)   Wt 244 lb 3.2 oz (110.8 kg)   SpO2 93%   BMI 47.69 kg/m   Wt Readings from Last 3 Encounters:  03/27/17 244 lb 3.2 oz (110.8 kg)  03/21/17 242 lb 3.2 oz (109.9 kg)  03/01/17 235 lb 4 oz (106.7 kg)    Physical Exam  Constitutional: She is oriented to person, place, and time. She appears well-developed and well-nourished. No distress.  HENT:  Head: Normocephalic and atraumatic.  Eyes:  Conjunctivae and lids are normal. Right eye exhibits no discharge. Left eye exhibits no discharge. No scleral icterus.  Neck: Normal range of motion. Neck supple. No JVD present. Carotid bruit is not present.  Cardiovascular: Normal rate, regular rhythm and normal heart sounds.  Pulmonary/Chest: Accessory muscle usage present. She has decreased breath sounds.  Abdominal: Normal appearance. There is no splenomegaly or hepatomegaly.  Musculoskeletal: Normal range of motion.  Neurological: She is alert and oriented to person, place, and time.  Skin: Skin is warm, dry and intact. No rash noted. No pallor.  Psychiatric: She has a normal mood and affect. Her behavior is normal. Judgment and thought content normal.     CBC with WBC 7.4  Assessment & Plan:   Problem List Items Addressed This Visit      Unprioritized   COPD (chronic obstructive pulmonary disease) ([place])    Needs aggressive treatment.  Increase bebulizer treatments to QID.  Already taking Breo plus Spireva for maximum treatment.  Move up pulmonary appt      Relevant Medications   predniSONE (DELTASONE) 20 MG tablet    Other Visit Diagnoses    Fever, unspecified fever cause    -  Primary   CBC normal.  Rx for Doxycycline  Relevant Orders   CBC With Differential/Platelet   Acute exacerbation of chronic bronchitis (HCC)       Rx for Doxycycline 100 mg BID.  Another round of Prednisone.  increase nebulizer to QID.  Get the x-ray ordered last week   Relevant Medications   predniSONE (DELTASONE) 20 MG tablet   Other Relevant Orders   Ambulatory referral to Pulmonology       Follow up plan: Return in about 1 week (around 04/03/2017).

## 2017-03-27 NOTE — Assessment & Plan Note (Signed)
Needs aggressive treatment.  Increase bebulizer treatments to QID.  Already taking Breo plus Spireva for maximum treatment.  Move up pulmonary appt

## 2017-03-28 IMAGING — CT CT ANGIO CHEST
2 of 6 series · 18 of 36 positions shown · IV contrast (APPLIED)
Comparison: None.

CLINICAL DATA: Chest pain

EXAM:
CT ANGIOGRAPHY CHEST WITH CONTRAST
TECHNIQUE: Multidetector CT imaging of the chest was performed using the
standard protocol during bolus administration of intravenous
contrast. Multiplanar CT image reconstructions and MIPs were
obtained to evaluate the vascular anatomy.
CONTRAST:  80mL OMNIPAQUE IOHEXOL 350 MG/ML SOLN

[Series 5: pe 1.0 thins · axial · 0.62mm/px · z∈[-268,-24]mm · 17 of 275 slices shown]
[im 16/275  lung]
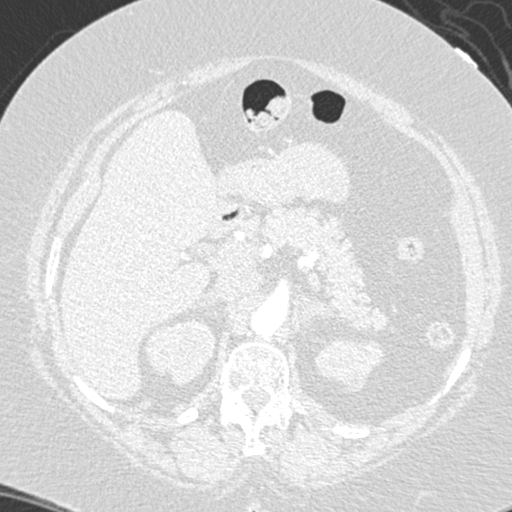
[im 31/275  mediastinal]
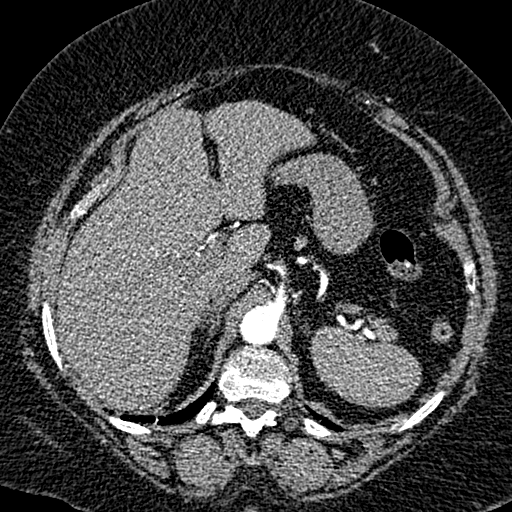
[im 46/275  lung]
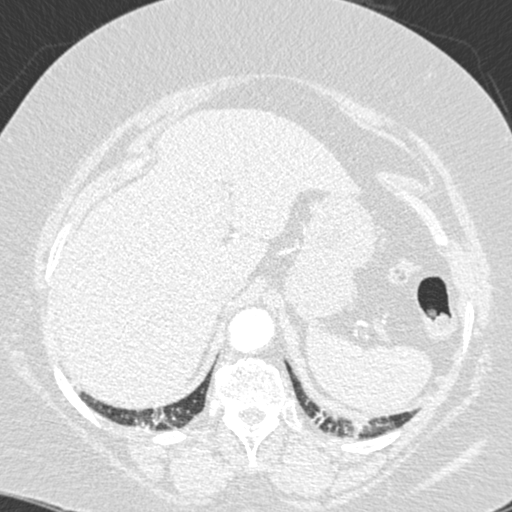
[im 61/275  mediastinal]
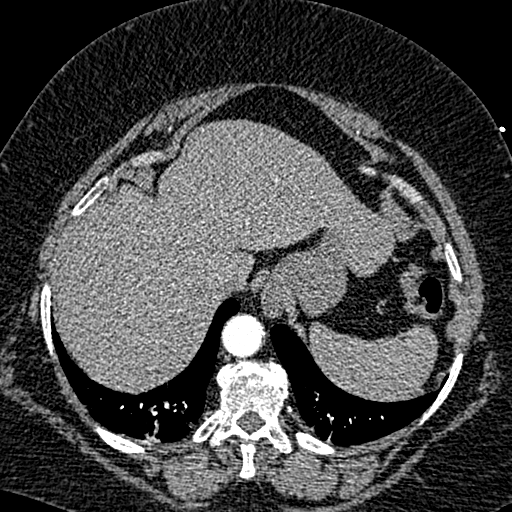
[im 77/275  lung]
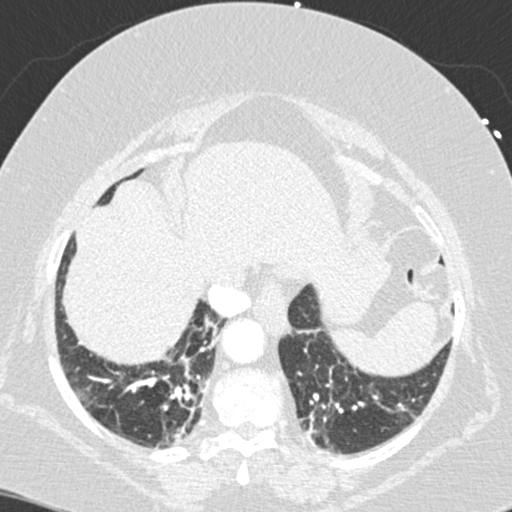
[im 92/275  mediastinal]
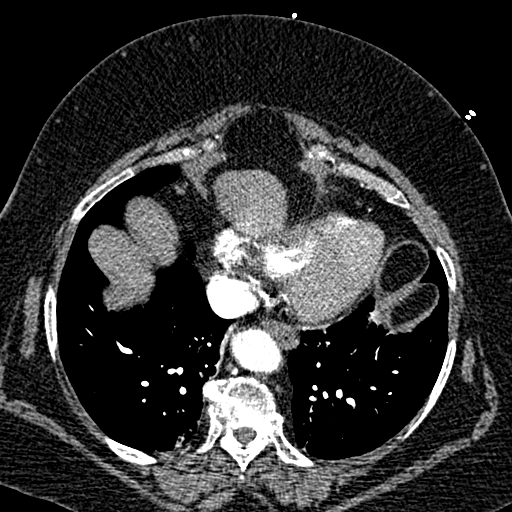
[im 107/275  lung]
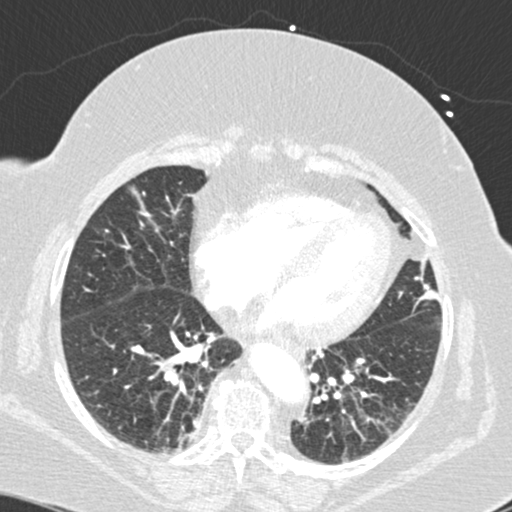
[im 122/275  mediastinal]
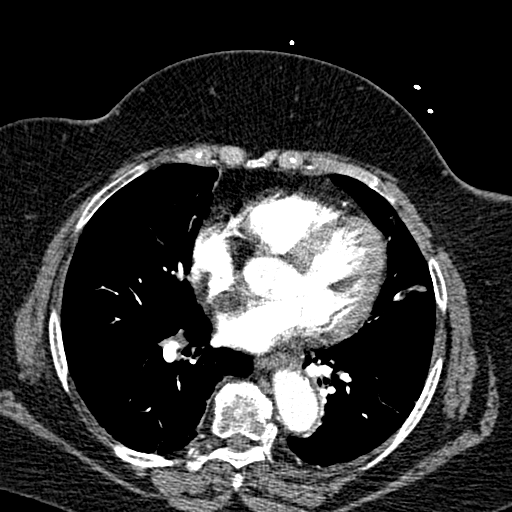
[im 138/275  lung]
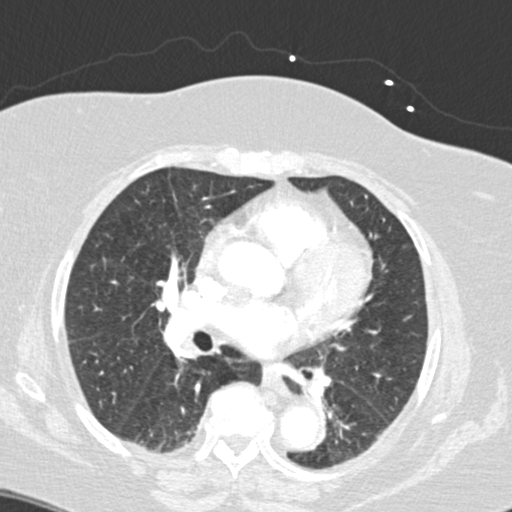
[im 153/275  mediastinal]
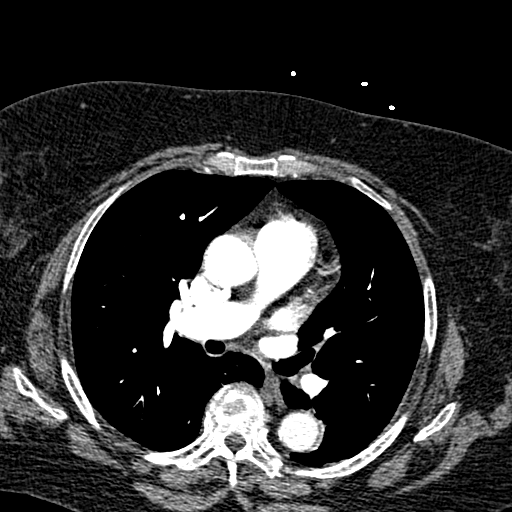
[im 168/275  lung]
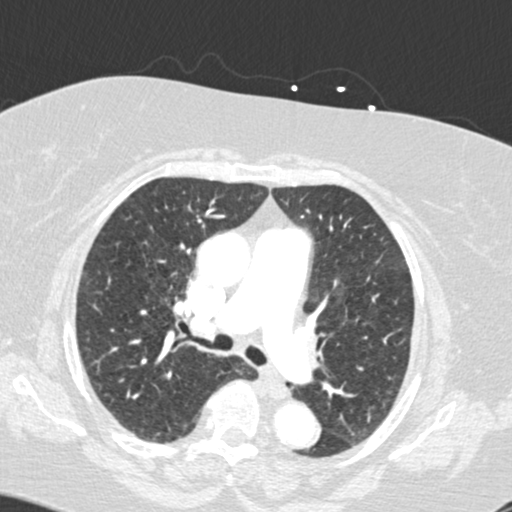
[im 183/275  mediastinal]
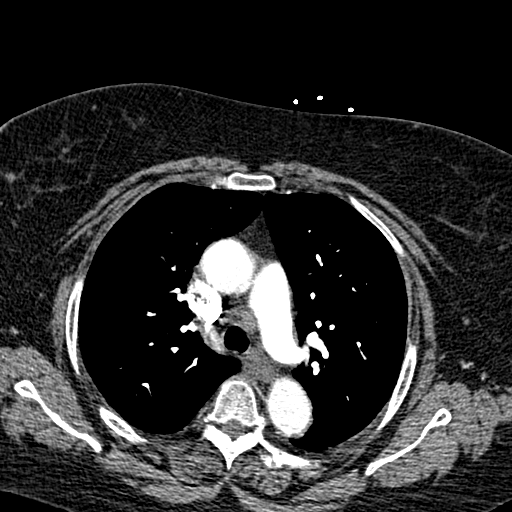
[im 198/275  lung]
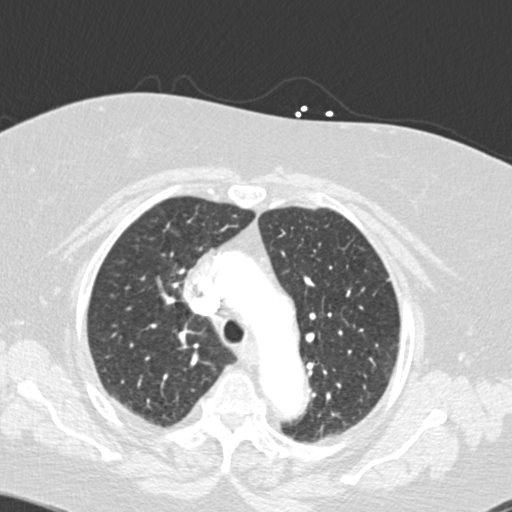
[im 214/275  mediastinal]
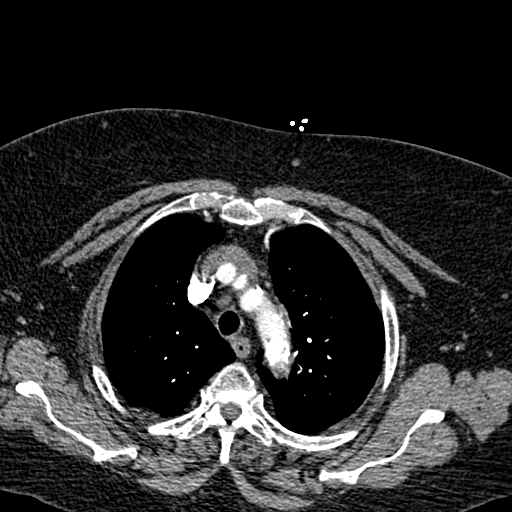
[im 229/275  lung]
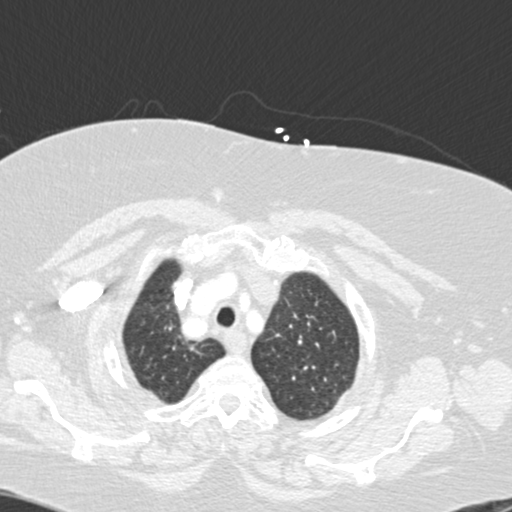
[im 244/275  mediastinal]
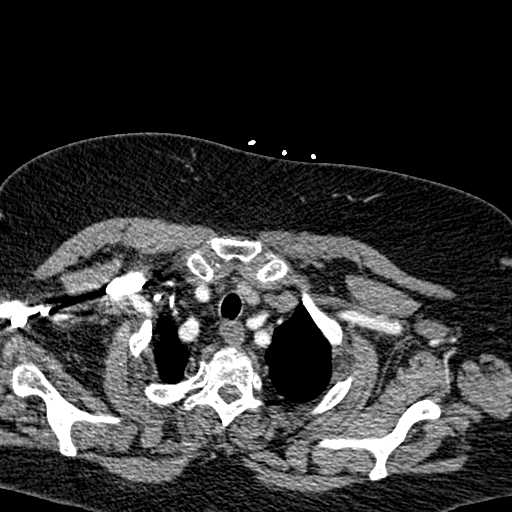
[im 259/275  lung]
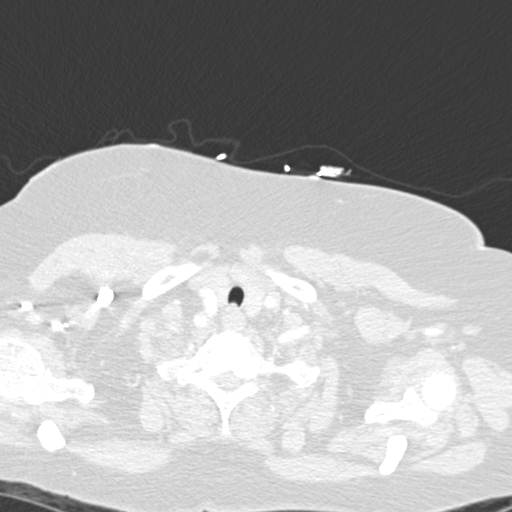

[Series 7: cor pe 2.0 mpr · coronal · 0.62mm/px · 1 of 122 slices shown]
[im 61/122  mediastinal]
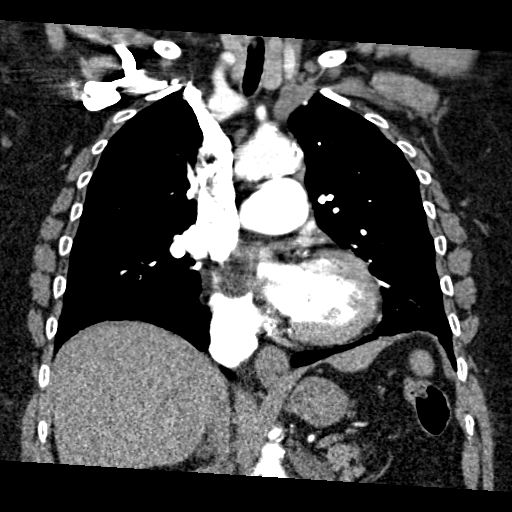

[18 of 36 positions shown; findings below may reference images not displayed]

FINDINGS: Cardiovascular: There is good opacification of the pulmonary
arteries. There is no pulmonary embolism. The thoracic aorta is
normal in caliber and intact.

Lungs: There is scattered linear scarring or atelectasis. No
confluent airspace consolidation.

Central airways: Patent

Effusions: None

Lymphadenopathy: None

Esophagus: Unremarkable

Upper abdomen: There is an indeterminate 12 mm focal lesion at the
superior pole of the left kidney. Hiatal hernia. Otherwise
unremarkable.

Musculoskeletal: No significant abnormality.

Review of the MIP images confirms the above findings.
IMPRESSION: 1. Negative for acute pulmonary embolism
2. Scattered linear atelectatic changes or scar in both lung bases
3. Indeterminate 12 mm left renal lesion, superior pole. Consider
ultrasonography to characterize.

## 2017-03-28 NOTE — Telephone Encounter (Signed)
Appointment scheduled.

## 2017-04-03 ENCOUNTER — Ambulatory Visit: Payer: Medicare HMO | Admitting: Unknown Physician Specialty

## 2017-04-05 ENCOUNTER — Emergency Department: Payer: Medicare HMO

## 2017-04-05 ENCOUNTER — Encounter: Payer: Self-pay | Admitting: Emergency Medicine

## 2017-04-05 ENCOUNTER — Ambulatory Visit: Payer: Medicare HMO | Admitting: Unknown Physician Specialty

## 2017-04-05 ENCOUNTER — Other Ambulatory Visit: Payer: Self-pay

## 2017-04-05 ENCOUNTER — Ambulatory Visit: Payer: Self-pay

## 2017-04-05 ENCOUNTER — Inpatient Hospital Stay
Admission: EM | Admit: 2017-04-05 | Discharge: 2017-04-08 | DRG: 191 | Disposition: A | Discharge status: Home / Self Care | Payer: Medicare HMO | Attending: Internal Medicine | Admitting: Internal Medicine

## 2017-04-05 DIAGNOSIS — F17210 Nicotine dependence, cigarettes, uncomplicated: Secondary | ICD-10-CM | POA: Diagnosis not present

## 2017-04-05 DIAGNOSIS — K219 Gastro-esophageal reflux disease without esophagitis: Secondary | ICD-10-CM | POA: Diagnosis present

## 2017-04-05 DIAGNOSIS — J441 Chronic obstructive pulmonary disease with (acute) exacerbation: Secondary | ICD-10-CM | POA: Diagnosis not present

## 2017-04-05 DIAGNOSIS — N183 Chronic kidney disease, stage 3 (moderate): Secondary | ICD-10-CM | POA: Diagnosis present

## 2017-04-05 DIAGNOSIS — J9610 Chronic respiratory failure, unspecified whether with hypoxia or hypercapnia: Secondary | ICD-10-CM | POA: Diagnosis present

## 2017-04-05 DIAGNOSIS — Z8249 Family history of ischemic heart disease and other diseases of the circulatory system: Secondary | ICD-10-CM | POA: Diagnosis not present

## 2017-04-05 DIAGNOSIS — Z96651 Presence of right artificial knee joint: Secondary | ICD-10-CM | POA: Diagnosis not present

## 2017-04-05 DIAGNOSIS — Z7982 Long term (current) use of aspirin: Secondary | ICD-10-CM

## 2017-04-05 DIAGNOSIS — Z7951 Long term (current) use of inhaled steroids: Secondary | ICD-10-CM

## 2017-04-05 DIAGNOSIS — E559 Vitamin D deficiency, unspecified: Secondary | ICD-10-CM | POA: Diagnosis present

## 2017-04-05 DIAGNOSIS — Z832 Family history of diseases of the blood and blood-forming organs and certain disorders involving the immune mechanism: Secondary | ICD-10-CM

## 2017-04-05 DIAGNOSIS — I5032 Chronic diastolic (congestive) heart failure: Secondary | ICD-10-CM | POA: Diagnosis present

## 2017-04-05 DIAGNOSIS — I130 Hypertensive heart and chronic kidney disease with heart failure and stage 1 through stage 4 chronic kidney disease, or unspecified chronic kidney disease: Secondary | ICD-10-CM | POA: Diagnosis not present

## 2017-04-05 DIAGNOSIS — Z9981 Dependence on supplemental oxygen: Secondary | ICD-10-CM | POA: Diagnosis not present

## 2017-04-05 DIAGNOSIS — F329 Major depressive disorder, single episode, unspecified: Secondary | ICD-10-CM | POA: Diagnosis present

## 2017-04-05 DIAGNOSIS — J449 Chronic obstructive pulmonary disease, unspecified: Secondary | ICD-10-CM | POA: Diagnosis not present

## 2017-04-05 DIAGNOSIS — R0602 Shortness of breath: Secondary | ICD-10-CM | POA: Diagnosis not present

## 2017-04-05 LAB — COMPREHENSIVE METABOLIC PANEL
ALT: 13 U/L — ABNORMAL LOW (ref 14–54)
AST: 19 U/L (ref 15–41)
Albumin: 3.6 g/dL (ref 3.5–5.0)
Alkaline Phosphatase: 83 U/L (ref 38–126)
Anion gap: 10 (ref 5–15)
BUN: 25 mg/dL — ABNORMAL HIGH (ref 6–20)
CHLORIDE: 109 mmol/L (ref 101–111)
CO2: 23 mmol/L (ref 22–32)
CREATININE: 1.92 mg/dL — AB (ref 0.44–1.00)
Calcium: 9.5 mg/dL (ref 8.9–10.3)
GFR calc non Af Amer: 24 mL/min — ABNORMAL LOW (ref >60)
GFR, EST AFRICAN AMERICAN: 28 mL/min — AB (ref >60)
Glucose, Bld: 124 mg/dL — ABNORMAL HIGH (ref 65–99)
POTASSIUM: 4.4 mmol/L (ref 3.5–5.1)
SODIUM: 142 mmol/L (ref 135–145)
Total Bilirubin: 0.7 mg/dL (ref 0.3–1.2)
Total Protein: 7.7 g/dL (ref 6.5–8.1)

## 2017-04-05 LAB — CBC
HEMATOCRIT: 42.9 % (ref 35.0–47.0)
HEMOGLOBIN: 14 g/dL (ref 12.0–16.0)
MCH: 30.9 pg (ref 26.0–34.0)
MCHC: 32.6 g/dL (ref 32.0–36.0)
MCV: 95 fL (ref 80.0–100.0)
PLATELETS: 220 10*3/uL (ref 150–440)
RBC: 4.52 MIL/uL (ref 3.80–5.20)
RDW: 16.3 % — ABNORMAL HIGH (ref 11.5–14.5)
WBC: 6.6 10*3/uL (ref 3.6–11.0)

## 2017-04-05 LAB — TROPONIN I: Troponin I: <0.03 ng/mL (ref <0.03)

## 2017-04-05 MED ORDER — IPRATROPIUM-ALBUTEROL 0.5-2.5 (3) MG/3ML IN SOLN
3.0000 mL | Freq: Once | RESPIRATORY_TRACT | Status: AC
Start: 2017-04-05 — End: 2017-04-05
  Administered 2017-04-05: 3 mL via RESPIRATORY_TRACT
  Filled 2017-04-05: qty 3

## 2017-04-05 MED ORDER — ENOXAPARIN SODIUM 30 MG/0.3ML ~~LOC~~ SOLN: 30.0000 mg | SUBCUTANEOUS | Status: DC

## 2017-04-05 MED ORDER — ENOXAPARIN SODIUM 40 MG/0.4ML ~~LOC~~ SOLN
40.0000 mg | SUBCUTANEOUS | Status: DC
  Administered 2017-04-05 – 2017-04-07 (×3): 40 mg via SUBCUTANEOUS
  Filled 2017-04-05 (×3): qty 0.4

## 2017-04-05 MED ORDER — PANTOPRAZOLE SODIUM 40 MG PO TBEC
40.0000 mg | DELAYED_RELEASE_TABLET | Freq: Every day | ORAL | Status: DC
  Administered 2017-04-06 – 2017-04-08 (×3): 40 mg via ORAL
  Filled 2017-04-05 (×3): qty 1

## 2017-04-05 MED ORDER — SODIUM CHLORIDE 0.9% FLUSH
3.0000 mL | Freq: Two times a day (BID) | INTRAVENOUS | Status: DC
  Administered 2017-04-05 – 2017-04-08 (×7): 3 mL via INTRAVENOUS

## 2017-04-05 MED ORDER — SODIUM CHLORIDE 0.9 % IV SOLN: 250.0000 mL | INTRAVENOUS | Status: DC | PRN

## 2017-04-05 MED ORDER — SODIUM CHLORIDE 0.9 % IV SOLN
1.0000 g | Freq: Once | INTRAVENOUS | Status: DC
  Filled 2017-04-05: qty 10

## 2017-04-05 MED ORDER — VITAMIN D 1000 UNITS PO TABS
1000.0000 [IU] | ORAL_TABLET | Freq: Every day | ORAL | Status: DC
  Administered 2017-04-06 – 2017-04-08 (×3): 1000 [IU] via ORAL
  Filled 2017-04-05 (×3): qty 1

## 2017-04-05 MED ORDER — MECLIZINE HCL 25 MG PO TABS
25.0000 mg | ORAL_TABLET | Freq: Two times a day (BID) | ORAL | Status: DC | PRN
  Filled 2017-04-05: qty 1

## 2017-04-05 MED ORDER — METHYLPREDNISOLONE SODIUM SUCC 125 MG IJ SOLR
125.0000 mg | Freq: Four times a day (QID) | INTRAMUSCULAR | Status: DC
  Administered 2017-04-05 – 2017-04-06 (×4): 125 mg via INTRAVENOUS
  Filled 2017-04-05 (×4): qty 2

## 2017-04-05 MED ORDER — SODIUM CHLORIDE 0.9 % IV SOLN
500.0000 mg | INTRAVENOUS | Status: DC
  Administered 2017-04-05: 500 mg via INTRAVENOUS
  Filled 2017-04-05 (×3): qty 500

## 2017-04-05 MED ORDER — ACETAMINOPHEN 650 MG RE SUPP: 650.0000 mg | Freq: Four times a day (QID) | RECTAL | Status: DC | PRN

## 2017-04-05 MED ORDER — FLUOXETINE HCL 10 MG PO CAPS
10.0000 mg | ORAL_CAPSULE | Freq: Every day | ORAL | Status: DC
  Administered 2017-04-06 – 2017-04-08 (×3): 10 mg via ORAL
  Filled 2017-04-05 (×3): qty 1

## 2017-04-05 MED ORDER — ASPIRIN EC 81 MG PO TBEC
81.0000 mg | DELAYED_RELEASE_TABLET | Freq: Every day | ORAL | Status: DC
  Administered 2017-04-06 – 2017-04-08 (×3): 81 mg via ORAL
  Filled 2017-04-05 (×3): qty 1

## 2017-04-05 MED ORDER — SODIUM CHLORIDE 0.9 % IV SOLN
1.0000 g | Freq: Every day | INTRAVENOUS | Status: DC
  Administered 2017-04-05 – 2017-04-06 (×2): 1 g via INTRAVENOUS
  Filled 2017-04-05 (×3): qty 10

## 2017-04-05 MED ORDER — ONDANSETRON HCL 4 MG PO TABS: 4.0000 mg | ORAL_TABLET | Freq: Four times a day (QID) | ORAL | Status: DC | PRN

## 2017-04-05 MED ORDER — IPRATROPIUM-ALBUTEROL 0.5-2.5 (3) MG/3ML IN SOLN
3.0000 mL | Freq: Four times a day (QID) | RESPIRATORY_TRACT | Status: DC
  Administered 2017-04-05 – 2017-04-07 (×9): 3 mL via RESPIRATORY_TRACT
  Filled 2017-04-05 (×10): qty 3

## 2017-04-05 MED ORDER — METHYLPREDNISOLONE SODIUM SUCC 125 MG IJ SOLR
125.0000 mg | Freq: Once | INTRAMUSCULAR | Status: AC
  Administered 2017-04-05: 125 mg via INTRAVENOUS
  Filled 2017-04-05: qty 2

## 2017-04-05 MED ORDER — IPRATROPIUM-ALBUTEROL 0.5-2.5 (3) MG/3ML IN SOLN
3.0000 mL | Freq: Once | RESPIRATORY_TRACT | Status: AC
  Administered 2017-04-05: 3 mL via RESPIRATORY_TRACT
  Filled 2017-04-05: qty 3

## 2017-04-05 MED ORDER — ACETAMINOPHEN 325 MG PO TABS: 650.0000 mg | ORAL_TABLET | Freq: Four times a day (QID) | ORAL | Status: DC | PRN

## 2017-04-05 MED ORDER — ONDANSETRON HCL 4 MG/2ML IJ SOLN: 4.0000 mg | Freq: Four times a day (QID) | INTRAMUSCULAR | Status: DC | PRN

## 2017-04-05 MED ORDER — SENNOSIDES-DOCUSATE SODIUM 8.6-50 MG PO TABS: 1.0000 | ORAL_TABLET | Freq: Every evening | ORAL | Status: DC | PRN

## 2017-04-05 MED ORDER — SODIUM CHLORIDE 0.9% FLUSH: 3.0000 mL | INTRAVENOUS | Status: DC | PRN

## 2017-04-05 MED ORDER — HYDRALAZINE HCL 10 MG PO TABS
10.0000 mg | ORAL_TABLET | Freq: Three times a day (TID) | ORAL | Status: DC
  Administered 2017-04-05 – 2017-04-06 (×4): 10 mg via ORAL
  Filled 2017-04-05 (×6): qty 1

## 2017-04-05 NOTE — ED Triage Notes (Signed)
Arrives with C/O SOB, swelling to chest and right face.  Spouse states he noticed the chest swelling this morning and right facial swelling 2 days ago.  Patient states she started feeling SOB this morning.

## 2017-04-05 NOTE — H&P (Signed)
[facility] NAME: Katherine [name]    MR#:  [id number]  DATE OF BIRTH:  Feb 21, 1940  DATE OF ADMISSION:  04/05/2017  PRIMARY CARE PHYSICIAN: [name], NP   REQUESTING/REFERRING PHYSICIAN:   CHIEF COMPLAINT:   Chief Complaint  Patient presents with  . Shortness of Breath  . Facial Swelling    HISTORY OF PRESENT ILLNESS: Katherine [name]  is a 77 y.o. female with a known history of COPD on home oxygen, chronic kidney disease stage III, anxiety disorder, GERD, cholelithiasis, hypertension, left ventricular hypertrophy, vitamin D deficiency, tobacco abuse presented to the emergency room with increased shortness of breath and cough.  Patient has increased shortness of breath for the last couple of days and wheezing in both lungs.  She was given nebulization treatment in the emergency room and IV Solu-Medrol for COPD flareup.  Patient was worked up with chest x-ray in the emergency room.  No complaints of any chest pain.  Fever and chills.  Hospitalist service was consulted for further care.  PAST MEDICAL HISTORY:   Past Medical History:  Diagnosis Date  . Angioedema   . Anxiety   . Anxiety and depression   . Chronic constipation   . Chronic kidney disease    stage 3  . COPD (chronic obstructive pulmonary disease) ([place])   . Gallstones   . GERD (gastroesophageal reflux disease)   . Hypertension   . Left ventricular hypertrophy   . Osteoarthritis   . Prurigo nodularis   . Tobacco abuse   . Vitamin D deficiency disease     PAST SURGICAL HISTORY:  Past Surgical History:  Procedure Laterality Date  . CHOLECYSTECTOMY    . 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   Procedure: TOTAL KNEE ARTHROPLASTY;  Surgeon: Hessie Knows, MD;  Location: [facility] ORS;  Service: Orthopedics;  Laterality: Right;    SOCIAL HISTORY:  Social History   Tobacco Use  . Smoking status: Light Tobacco Smoker     Packs/day: 0.00    Years: 52.00    Pack years: 0.00    Types: Cigarettes  . Smokeless tobacco: Never Used  . Tobacco comment: smokes 2 cig a day   Substance Use Topics  . Alcohol use: No    Alcohol/week: 0.0 oz    Comment: rare    FAMILY HISTORY:  Family History  Problem Relation Age of Onset  . Alcohol abuse Mother   . Sickle cell anemia Daughter   . Hypertension Son   . Cancer Neg Hx   . COPD Neg Hx   . Diabetes Neg Hx   . Heart disease Neg Hx   . Stroke Neg Hx     DRUG ALLERGIES:  Allergies  Allergen Reactions  . Bee Venom Swelling  . Enalapril Maleate Swelling  . Other     Bee stings    REVIEW OF SYSTEMS:   CONSTITUTIONAL: No fever, fatigue or weakness.  EYES: No blurred or double vision.  EARS, NOSE, AND THROAT: No tinnitus or ear pain.  RESPIRATORY: Has cough, shortness of breath, wheezing  no hemoptysis.  CARDIOVASCULAR: No chest pain, orthopnea, edema.  GASTROINTESTINAL: No nausea, vomiting, diarrhea or abdominal pain.  GENITOURINARY: No dysuria, hematuria.  ENDOCRINE: No polyuria, nocturia,  HEMATOLOGY: No anemia, easy bruising or bleeding SKIN: No rash or lesion. MUSCULOSKELETAL: No joint pain or arthritis.   NEUROLOGIC: No tingling, numbness, weakness.  PSYCHIATRY: No anxiety or  depression.   MEDICATIONS AT HOME:  Prior to Admission medications   Medication Sig Start Date End Date Taking? Authorizing Provider  acetaminophen (TYLENOL) 650 MG CR tablet Take 650-1,300 mg by mouth every 8 (eight) hours as needed for pain.    Yes [provider]  albuterol (PROVENTIL HFA) 108 (90 Base) MCG/ACT inhaler Inhale 2 puffs into the lungs every 4 (four) hours as needed for wheezing or shortness of breath. 03/13/16  Yes [name], NP  aspirin EC 81 MG tablet Take 81 mg by mouth daily.   Yes [provider]  cetirizine (ZYRTEC) 10 MG tablet TAKE 1/2 TABLET(5 MG) BY MOUTH AT BEDTIME 03/26/17  Yes [name], NP  cholecalciferol  (VITAMIN D) 1000 units tablet Take 1,000 Units by mouth daily.   Yes [provider]  doxycycline (VIBRA-TABS) 100 MG tablet Take 1 tablet (100 mg total) by mouth 2 (two) times daily. 03/27/17  Yes [name], NP  FLUoxetine (PROZAC) 10 MG capsule TAKE 1 CAPSULE(10 MG) BY MOUTH DAILY 01/30/17  Yes [name], NP  fluticasone furoate-vilanterol (BREO ELLIPTA) 100-25 MCG/INH AEPB Inhale 1 puff into the lungs daily. 04/27/16  Yes [name], MD  hydrALAZINE (APRESOLINE) 25 MG tablet TAKE 1 TABLET(25 MG) BY MOUTH TWICE DAILY 01/08/17  Yes [name], [name], MD  ipratropium-albuterol (DUONEB) 0.5-2.5 (3) MG/3ML SOLN Take 3 mLs by nebulization every 6 (six) hours as needed. 11/29/15  Yes [name], MD  meclizine (ANTIVERT) 25 MG tablet TAKE 1 TABLET(25 MG) BY MOUTH THREE TIMES DAILY AS NEEDED FOR DIZZINESS 10/27/16  Yes [name], [name], MD  omeprazole (PRILOSEC) 40 MG capsule Take 40 mg by mouth daily.   Yes [provider]  SPIRIVA HANDIHALER 18 MCG inhalation capsule PLACE 1 CAPSULE INTO INHALER AND INHALE EVERY DAY 03/26/17  Yes [name], NP  tiotropium (SPIRIVA HANDIHALER) 18 MCG inhalation capsule Place 1 capsule (18 mcg total) into inhaler and inhale daily. 03/20/16  Yes [name], NP  predniSONE (DELTASONE) 20 MG tablet Take 2 tablets (40 mg total) by mouth daily with breakfast. Patient not taking: Reported on 04/05/2017 03/27/17   [name], NP      PHYSICAL EXAMINATION:   VITAL SIGNS: Blood pressure (!) 155/117, pulse 88, temperature 98.2 F (36.8 C), temperature source Oral, resp. rate 17, height 5' (1.524 m), weight 114.3 kg (252 lb), SpO2 93 %.  GENERAL:  77 y.o.-year-old obese patient lying in the bed with no acute distress.  EYES: Pupils equal, round, reactive to light and accommodation. No scleral icterus. Extraocular muscles intact.  HEENT: Head atraumatic, normocephalic. Oropharynx and nasopharynx clear.  NECK:  Supple, no jugular venous  distention. No thyroid enlargement, no tenderness.  LUNGS: Decreased breath sounds bilaterally, bilateral wheezing, no rales,rhonchi or crepitation. No use of accessory muscles of respiration.  CARDIOVASCULAR: S1, S2 normal. No murmurs, rubs, or gallops.  ABDOMEN: Soft, nontender, nondistended. Bowel sounds present. No organomegaly or mass.  EXTREMITIES: No pedal edema, cyanosis, or clubbing.  NEUROLOGIC: Cranial nerves II through XII are intact. Muscle strength 5/5 in all extremities. Sensation intact. Gait not checked.  PSYCHIATRIC: The patient is alert and oriented x 3.  SKIN: No obvious rash, lesion, or ulcer.   LABORATORY PANEL:   CBC Recent Labs  Lab 04/05/17 1041  WBC 6.6  HGB 14.0  HCT 42.9  PLT 220  MCV 95.0  MCH 30.9  MCHC 32.6  RDW 16.3*   ------------------------------------------------------------------------------------------------------------------  Chemistries  Recent Labs  Lab 04/05/17 1041  NA 142  K 4.4  CL 109  CO2 23  GLUCOSE 124*  BUN 25*  CREATININE 1.92*  CALCIUM 9.5  AST 19  ALT 13*  ALKPHOS 83  BILITOT 0.7   ------------------------------------------------------------------------------------------------------------------ estimated creatinine clearance is 29.2 mL/min (A) (by C-G formula based on SCr of 1.92 mg/dL (H)). ------------------------------------------------------------------------------------------------------------------ No results for input(s): TSH, T4TOTAL, T3FREE, THYROIDAB in the last 72 hours.  Invalid input(s): FREET3   Coagulation profile No results for input(s): INR, PROTIME in the last 168 hours. ------------------------------------------------------------------------------------------------------------------- No results for input(s): DDIMER in the last 72 hours. -------------------------------------------------------------------------------------------------------------------  Cardiac Enzymes Recent Labs  Lab  04/05/17 1041  TROPONINI <0.03   ------------------------------------------------------------------------------------------------------------------ Invalid input(s): POCBNP  ---------------------------------------------------------------------------------------------------------------  Urinalysis    Component Value Date/Time   COLORURINE YELLOW (A) 02/11/2016 0617   APPEARANCEUR CLEAR (A) 02/11/2016 0617   APPEARANCEUR Clear 02/20/2013 1459   LABSPEC 1.012 02/11/2016 0617   LABSPEC 1.012 02/20/2013 1459   PHURINE 6.0 02/11/2016 0617   GLUCOSEU NEGATIVE 02/11/2016 0617   GLUCOSEU Negative 02/20/2013 1459   HGBUR SMALL (A) 02/11/2016 0617   BILIRUBINUR NEGATIVE 02/11/2016 0617   BILIRUBINUR Negative 02/20/2013 1459   KETONESUR NEGATIVE 02/11/2016 0617   PROTEINUR 30 (A) 02/11/2016 0617   NITRITE NEGATIVE 02/11/2016 0617   LEUKOCYTESUR TRACE (A) 02/11/2016 0617   LEUKOCYTESUR Negative 02/20/2013 1459     RADIOLOGY: Dg Chest 2 View  Result Date: 04/05/2017 CLINICAL DATA:  Shortness of breath.  COPD. EXAM: CHEST  2 VIEW COMPARISON:  11/28/2015 FINDINGS: The cardiac silhouette remains borderline enlarged. Aortic atherosclerosis is noted. Chronic interstitial coarsening is unchanged. No confluent airspace opacity, overt pulmonary edema, pleural effusion, pneumothorax is identified. No acute osseous abnormality is seen. IMPRESSION: COPD without evidence of active cardiopulmonary disease. Electronically Signed   By: Logan Bores M.D.   On: 04/05/2017 12:40    EKG: Orders placed or performed during the hospital encounter of 04/05/17  . ED EKG within 10 minutes  . 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  Admitting diagnosis 1.  Acute COPD exacerbation 2.  Chronic  bronchitis 3.  Chronic kidney disease stage III 4.  GERD 5.  Hypertension Treatment plan Admit patient to medical floor inpatient service IV Solu-Medrol 125 mg every 6 hourly Nebulization treatments Start patient on IV Rocephin and Zithromax antibiotic DVT prophylaxis with subcu heparin Monitor electrolytes and renal function  All the records are reviewed and case discussed with ED provider. Management plans discussed with the patient, family and they are in agreement.  CODE STATUS:FULL CODE Code Status History    Date Active Date Inactive Code Status Order ID Comments User Context   02/08/2016 14:14 02/11/2016 17:29 Full Code [id number]  Hessie Knows, MD Inpatient   11/27/2015 16:45 11/29/2015 16:28 Full Code [id number]  Idelle Crouch, MD Inpatient   02/10/2015 21:22 02/12/2015 18:09 Full Code [id number]  Lytle Butte, MD ED   09/16/2014 12:20 09/19/2014 17:36 Full Code [id number]  Aldean Jewett, MD Inpatient       TOTAL TIME TAKING CARE OF THIS PATIENT: 52 minutes.    Saundra Shelling M.D on 04/05/2017 at 1:35 PM  Between 7am to 6pm - Pager - [phone]  After 6pm go to [email or web address] - password [email or web address]  [facility]  Office  [phone]  CC:  Primary care physician; [name], NP

## 2017-04-05 NOTE — Telephone Encounter (Signed)
Patient's husband called and said "my wife is breathing real hard and her chest is swollen up to her neck and the right side of her face. It started this morning after coming out of the bathroom, she took all of her medicines this morning, she used her inhaler, but it's not working." I asked to speak to the wife, she said "I can't talk right now." I asked does she wear oxygen, he said "yes, at night and she has a larger tank here." I asked does she have something to put on her finger to check her oxygen level, he said "no, she doesn't have that." I asked is she coughing, running a fever, he said "no, but she says she's dizzy." According to protocol, go to ED now, he said "I'll take her now." Care advice given, he verbalized understanding.   Reason for Disposition . [1] MODERATE difficulty breathing (e.g., speaks in phrases, SOB even at rest, pulse 100-120) AND [2] NEW-onset or WORSE than normal  Answer Assessment - Initial Assessment Questions 1. RESPIRATORY STATUS: "Describe your breathing?" (e.g., wheezing, shortness of breath, unable to speak, severe coughing)      Real heavy, chest is swollen right side of face swollen 2. ONSET: "When did this breathing problem begin?"      This morning 3. PATTERN "Does the difficult breathing come and go, or has it been constant since it started?"      Constant 4. SEVERITY: "How bad is your breathing?" (e.g., mild, moderate, severe)    - MILD: No SOB at rest, mild SOB with walking, speaks normally in sentences, can lay down, no retractions, pulse < 100.    - MODERATE: SOB at rest, SOB with minimal exertion and prefers to sit, cannot lie down flat, speaks in phrases, mild retractions, audible wheezing, pulse 100-120.    - SEVERE: Very SOB at rest, speaks in single words, struggling to breathe, sitting hunched forward, retractions, pulse > 120      Severe 5. RECURRENT SYMPTOM: "Have you had difficulty breathing before?" If so, ask: "When was the last time?" and  "What happened that time?"      No 6. CARDIAC HISTORY: "Do you have any history of heart disease?" (e.g., heart attack, angina, bypass surgery, angioplasty)      No 7. LUNG HISTORY: "Do you have any history of lung disease?"  (e.g., pulmonary embolus, asthma, emphysema)     COPD 8. CAUSE: "What do you think is causing the breathing problem?"      Unknown 9. OTHER SYMPTOMS: "Do you have any other symptoms? (e.g., dizziness, runny nose, cough, chest pain, fever)     Dizziness 10. PREGNANCY: "Is there any chance you are pregnant?" "When was your last menstrual period?"       No 11. TRAVEL: "Have you traveled out of the country in the last month?" (e.g., travel history, exposures)       No  Protocols used: BREATHING DIFFICULTY-A-AH

## 2017-04-05 NOTE — ED Notes (Addendum)
Patient given ice per MD. Family sitting at bedside with patient.

## 2017-04-05 NOTE — Progress Notes (Signed)
Pharmacy Antibiotic Note  Katherine [name] is a 77 y.o. female admitted on 04/05/2017 with SOB.  Pharmacy has been consulted for ceftriaxone dosing. Patient is also ordered azithromycin.  Plan: Ceftriaxone 1 g iv q 24 h.   Height: 5' (152.4 cm) Weight: 252 lb (114.3 kg) IBW/kg (Calculated) : 45.5  Temp (24hrs), Avg:98.4 F (36.9 C), Min:98.2 F (36.8 C), Max:98.6 F (37 C)  Recent Labs  Lab 04/05/17 1041  WBC 6.6  CREATININE 1.92*    Estimated Creatinine Clearance: 29.2 mL/min (A) (by C-G formula based on SCr of 1.92 mg/dL (H)).    Allergies  Allergen Reactions  . Bee Venom Swelling  . Enalapril Maleate Swelling  . Other     Bee stings    Antimicrobials this admission: CTX 2/21 >>  azithromycin 2/21 >>   Dose adjustments this admission:   Microbiology results:   Thank you for allowing pharmacy to be a part of this patient's care.  Napoleon Form 04/05/2017 4:17 PM

## 2017-04-05 NOTE — ED Provider Notes (Signed)
[name] Emergency Department Provider Note   ____________________________________________    I have reviewed the triage vital signs and the nursing notes.   HISTORY  Chief Complaint Shortness of Breath and Facial Swelling     HPI Katherine [name] is a 77 y.o. female who presents with complaints of shortness of breath.  Patient reports over the last 24-48 hours her breathing has become steadily worse.  She does have a history of COPD and continues to smoke cigarettes.  She is on oxygen at home.  She does report a nonproductive cough as well.  Her significant other's thought that the right side of her face was swollen but she denies this.  No throat swelling, difficulty swallowing or irritation.  Denies fevers   Past Medical History:  Diagnosis Date  . Angioedema   . Anxiety   . Anxiety and depression   . Chronic constipation   . Chronic kidney disease    stage 3  . COPD (chronic obstructive pulmonary disease) ([place])   . Gallstones   . GERD (gastroesophageal reflux disease)   . Hypertension   . Left ventricular hypertrophy   . Osteoarthritis   . Prurigo nodularis   . Tobacco abuse   . Vitamin D deficiency disease     Patient Active Problem List   Diagnosis Date Noted  . Hand pain 03/21/2017  . Dry skin 03/21/2017  . Pain in finger of left hand 09/12/2016  . Chronic fatigue 06/12/2016  . Left knee pain 06/12/2016  . Advanced care planning/counseling discussion 03/13/2016  . DNR (do not resuscitate) 03/13/2016  . Primary localized osteoarthritis of right knee 02/08/2016  . OSA and COPD overlap syndrome ([place]) 09/16/2015  . Rotator cuff syndrome 09/07/2015  . 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 Sleep disturbance 04/14/2015  . Coronary artery disease 03/14/2015  . 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 Anxiety   . Tobacco abuse   . Prurigo nodularis   . Hypertensive heart disease   . GERD  (gastroesophageal reflux disease)   . COPD (chronic obstructive pulmonary disease) ([place])   . Osteoarthritis   . Vitamin D deficiency disease   . Chronic constipation   . Chronic kidney disease   . LVH (left ventricular hypertrophy) 05/20/2013  . Essential hypertension 05/20/2013  . Morbid obesity ([place]) 05/20/2013    Past Surgical History:  Procedure Laterality Date  . CHOLECYSTECTOMY    . 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   Procedure: TOTAL KNEE ARTHROPLASTY;  Surgeon: Hessie Knows, MD;  Location: [facility] ORS;  Service: Orthopedics;  Laterality: Right;    Prior to Admission medications   Medication Sig Start Date End Date Taking? Authorizing Provider  acetaminophen (TYLENOL) 650 MG CR tablet Take 650-1,300 mg by mouth every 8 (eight) hours as needed for pain.    Yes [provider]  albuterol (PROVENTIL HFA) 108 (90 Base) MCG/ACT inhaler Inhale 2 puffs into the lungs every 4 (four) hours as needed for wheezing or shortness of breath. 03/13/16  Yes [name], NP  aspirin EC 81 MG tablet Take 81 mg by mouth daily.   Yes [provider]  cetirizine (ZYRTEC) 10 MG tablet TAKE 1/2 TABLET(5 MG) BY MOUTH AT BEDTIME 03/26/17  Yes [name], NP  cholecalciferol (VITAMIN D) 1000 units tablet Take 1,000 Units by mouth daily.   Yes [provider]  doxycycline (VIBRA-TABS) 100 MG tablet  Take 1 tablet (100 mg total) by mouth 2 (two) times daily. 03/27/17  Yes [name], NP  FLUoxetine (PROZAC) 10 MG capsule TAKE 1 CAPSULE(10 MG) BY MOUTH DAILY 01/30/17  Yes [name], NP  fluticasone furoate-vilanterol (BREO ELLIPTA) 100-25 MCG/INH AEPB Inhale 1 puff into the lungs daily. 04/27/16  Yes [name], MD  hydrALAZINE (APRESOLINE) 25 MG tablet TAKE 1 TABLET(25 MG) BY MOUTH TWICE DAILY 01/08/17  Yes [name], [name], MD  ipratropium-albuterol (DUONEB) 0.5-2.5 (3) MG/3ML SOLN Take 3 mLs by nebulization every 6 (six)  hours as needed. 11/29/15  Yes [name], MD  meclizine (ANTIVERT) 25 MG tablet TAKE 1 TABLET(25 MG) BY MOUTH THREE TIMES DAILY AS NEEDED FOR DIZZINESS 10/27/16  Yes [name], [name], MD  omeprazole (PRILOSEC) 40 MG capsule Take 40 mg by mouth daily.   Yes [provider]  SPIRIVA HANDIHALER 18 MCG inhalation capsule PLACE 1 CAPSULE INTO INHALER AND INHALE EVERY DAY 03/26/17  Yes [name], NP  tiotropium (SPIRIVA HANDIHALER) 18 MCG inhalation capsule Place 1 capsule (18 mcg total) into inhaler and inhale daily. 03/20/16  Yes [name], NP  predniSONE (DELTASONE) 20 MG tablet Take 2 tablets (40 mg total) by mouth daily with breakfast. Patient not taking: Reported on 04/05/2017 03/27/17   [name], NP     Allergies Bee venom; Enalapril maleate; and Other  Family History  Problem Relation Age of Onset  . Alcohol abuse Mother   . Sickle cell anemia Daughter   . Hypertension Son   . Cancer Neg Hx   . COPD Neg Hx   . Diabetes Neg Hx   . Heart disease Neg Hx   . Stroke Neg Hx     Social History Social History   Tobacco Use  . Smoking status: Light Tobacco Smoker    Packs/day: 0.00    Years: 52.00    Pack years: 0.00    Types: Cigarettes  . Smokeless tobacco: Never Used  . Tobacco comment: smokes 2 cig a day   Substance Use Topics  . Alcohol use: No    Alcohol/week: 0.0 oz    Comment: rare  . Drug use: No    Review of Systems  Constitutional: No fever/chills Eyes: No visual changes.  ENT: No throat swelling Cardiovascular: Denies chest pain. Respiratory: As above Gastrointestinal: No abdominal pain.  .   Genitourinary: Negative for dysuria. Musculoskeletal: Negative for back pain. Skin: Negative for rash. Neurological: Negative for headaches    ____________________________________________   PHYSICAL EXAM:  VITAL SIGNS: ED Triage Vitals  Enc Vitals Group     BP 04/05/17 1024 127/76     Pulse Rate 04/05/17 1024 (!) 104     Resp 04/05/17  1024 (!) 22     Temp 04/05/17 1024 98.2 F (36.8 C)     Temp Source 04/05/17 1024 Oral     SpO2 04/05/17 1024 94 %     Weight 04/05/17 1024 114.3 kg (252 lb)     Height 04/05/17 1024 1.524 m (5')     Head Circumference --      Peak Flow --      Pain Score 04/05/17 1031 0     Pain Loc --      Pain Edu? --      Excl. in [place]? --     Constitutional: Alert and oriented  Eyes: Conjunctivae are normal.  . Pharynx: No swelling, no stridor Neck:  Painless ROM Cardiovascular: Normal rate, regular rhythm. Grossly  normal heart sounds.  Good peripheral circulation. Respiratory: Increased respiratory effort with mild tachypnea, diffuse wheezing Gastrointestinal: Soft and nontender. No distention.  Genitourinary: deferred Musculoskeletal: No lower extremity tenderness nor edema.  Warm and well perfused Neurologic:  Normal speech and language. No gross focal neurologic deficits are appreciated.  Skin:  Skin is warm, dry and intact. No rash noted. Psychiatric: Mood and affect are normal. Speech and behavior are normal.  ____________________________________________   LABS (all labs ordered are listed, but only abnormal results are displayed)  Labs Reviewed  COMPREHENSIVE METABOLIC PANEL - Abnormal; Notable for the following components:      Result Value   Glucose, Bld 124 (*)    BUN 25 (*)    Creatinine, Ser 1.92 (*)    ALT 13 (*)    GFR calc non Af Amer 24 (*)    GFR calc Af Amer 28 (*)    All other components within normal limits  CBC - Abnormal; Notable for the following components:   RDW 16.3 (*)    All other components within normal limits  TROPONIN I   ____________________________________________  EKG  None ____________________________________________  RADIOLOGY  Chest x-ray unremarkable ____________________________________________   PROCEDURES  Procedure(s) performed: No  Procedures   Critical Care performed:  No ____________________________________________   INITIAL IMPRESSION / ASSESSMENT AND PLAN / ED COURSE  Pertinent labs & imaging results that were available during my care of the patient were reviewed by me and considered in my medical decision making (see chart for details).  Patient presents primarily with complaint of shortness of breath.  On exam no evidence of significant swelling of the face nor evidence of angioedema or pharyngeal swelling.  Suspect significant COPD exacerbation, will treat with Solu-Medrol and duo nebs   After treatment patient continues to wheeze significantly, although she reports feeling selectively better.  Lab work overall is unremarkable, chest x-ray negative for pneumonia, will discuss with hospitalist for admission    ____________________________________________   FINAL CLINICAL IMPRESSION(S) / ED DIAGNOSES  Final diagnoses:  COPD exacerbation ([place])        Note:  This document was prepared using Dragon voice recognition software and may include unintentional dictation errors.    Lavonia Drafts, MD 04/05/17 1322

## 2017-04-06 LAB — BASIC METABOLIC PANEL
ANION GAP: 12 (ref 5–15)
BUN: 30 mg/dL — ABNORMAL HIGH (ref 6–20)
CHLORIDE: 108 mmol/L (ref 101–111)
CO2: 18 mmol/L — AB (ref 22–32)
Calcium: 9.4 mg/dL (ref 8.9–10.3)
Creatinine, Ser: 1.89 mg/dL — ABNORMAL HIGH (ref 0.44–1.00)
GFR calc non Af Amer: 25 mL/min — ABNORMAL LOW (ref >60)
GFR, EST AFRICAN AMERICAN: 29 mL/min — AB (ref >60)
GLUCOSE: 188 mg/dL — AB (ref 65–99)
POTASSIUM: 4.5 mmol/L (ref 3.5–5.1)
Sodium: 138 mmol/L (ref 135–145)

## 2017-04-06 LAB — CBC
HEMATOCRIT: 39.1 % (ref 35.0–47.0)
HEMOGLOBIN: 12.8 g/dL (ref 12.0–16.0)
MCH: 30.8 pg (ref 26.0–34.0)
MCHC: 32.6 g/dL (ref 32.0–36.0)
MCV: 94.4 fL (ref 80.0–100.0)
Platelets: 220 10*3/uL (ref 150–440)
RBC: 4.14 MIL/uL (ref 3.80–5.20)
RDW: 16.4 % — ABNORMAL HIGH (ref 11.5–14.5)
WBC: 6.5 10*3/uL (ref 3.6–11.0)

## 2017-04-06 LAB — PROCALCITONIN: Procalcitonin: <0.1 ng/mL

## 2017-04-06 MED ORDER — AZITHROMYCIN 250 MG PO TABS
500.0000 mg | ORAL_TABLET | Freq: Every day | ORAL | Status: DC
  Administered 2017-04-06 – 2017-04-07 (×2): 500 mg via ORAL
  Filled 2017-04-06 (×2): qty 2

## 2017-04-06 MED ORDER — HYDRALAZINE HCL 25 MG PO TABS
25.0000 mg | ORAL_TABLET | Freq: Two times a day (BID) | ORAL | Status: DC
  Administered 2017-04-06 – 2017-04-08 (×4): 25 mg via ORAL
  Filled 2017-04-06 (×4): qty 1

## 2017-04-06 MED ORDER — METHYLPREDNISOLONE SODIUM SUCC 125 MG IJ SOLR
60.0000 mg | Freq: Two times a day (BID) | INTRAMUSCULAR | Status: DC
  Administered 2017-04-06 – 2017-04-08 (×4): 60 mg via INTRAVENOUS
  Filled 2017-04-06 (×4): qty 2

## 2017-04-06 NOTE — Care Management (Signed)
Patient confirms that she has home O2 with [employer].  Patient states that she has portable O2 tank, but only wears O2 when she needs it.

## 2017-04-06 NOTE — Progress Notes (Signed)
[place] at [place] NAME: Katherine [name]    MR#:  [id number]  DATE OF BIRTH:  11-Aug-1940  SUBJECTIVE:  CHIEF COMPLAINT:   Chief Complaint  Patient presents with  . Shortness of Breath  . Facial Swelling    Came with worsening shortness of breath and cough. Feels slightly better today.  REVIEW OF SYSTEMS:  CONSTITUTIONAL: No fever, fatigue or weakness.  EYES: No blurred or double vision.  EARS, NOSE, AND THROAT: No tinnitus or ear pain.  RESPIRATORY: Positive for cough, shortness of breath, wheezing , no hemoptysis.  CARDIOVASCULAR: No chest pain, orthopnea, edema.  GASTROINTESTINAL: No nausea, vomiting, diarrhea or abdominal pain.  GENITOURINARY: No dysuria, hematuria.  ENDOCRINE: No polyuria, nocturia,  HEMATOLOGY: No anemia, easy bruising or bleeding SKIN: No rash or lesion. MUSCULOSKELETAL: No joint pain or arthritis.   NEUROLOGIC: No tingling, numbness, weakness.  PSYCHIATRY: No anxiety or depression.   ROS  DRUG ALLERGIES:   Allergies  Allergen Reactions  . Bee Venom Swelling  . Enalapril Maleate Swelling  . Other     Bee stings    VITALS:  Blood pressure (!) 167/79, pulse 83, temperature 98.1 F (36.7 C), temperature source Oral, resp. rate (!) 21, height 5' (1.524 m), weight 114.3 kg (252 lb), SpO2 94 %.  PHYSICAL EXAMINATION:  GENERAL:  77 y.o.-year-old patient lying in the bed with no acute distress.  EYES: Pupils equal, round, reactive to light and accommodation. No scleral icterus. Extraocular muscles intact.  HEENT: Head atraumatic, normocephalic. Oropharynx and nasopharynx clear.  NECK:  Supple, no jugular venous distention. No thyroid enlargement, no tenderness.  LUNGS: Normal breath sounds bilaterally, bilateral wheezing, no crepitation. No use of accessory muscles of respiration.  CARDIOVASCULAR: S1, S2 normal. No murmurs, rubs, or gallops.  ABDOMEN: Soft, nontender, nondistended. Bowel sounds present. No  organomegaly or mass.  EXTREMITIES: No pedal edema, cyanosis, or clubbing.  NEUROLOGIC: Cranial nerves II through XII are intact. Muscle strength 4- 5/5 in all extremities. Sensation intact. Gait not checked.  PSYCHIATRIC: The patient is alert and oriented x 3.  SKIN: No obvious rash, lesion, or ulcer.   Physical Exam LABORATORY PANEL:   CBC Recent Labs  Lab 04/06/17 0500  WBC 6.5  HGB 12.8  HCT 39.1  PLT 220   ------------------------------------------------------------------------------------------------------------------  Chemistries  Recent Labs  Lab 04/05/17 1041 04/06/17 0500  NA 142 138  K 4.4 4.5  CL 109 108  CO2 23 18*  GLUCOSE 124* 188*  BUN 25* 30*  CREATININE 1.92* 1.89*  CALCIUM 9.5 9.4  AST 19  --   ALT 13*  --   ALKPHOS 83  --   BILITOT 0.7  --    ------------------------------------------------------------------------------------------------------------------  Cardiac Enzymes Recent Labs  Lab 04/05/17 1041  TROPONINI <0.03   ------------------------------------------------------------------------------------------------------------------  RADIOLOGY:  Dg Chest 2 View  Result Date: 04/05/2017 CLINICAL DATA:  Shortness of breath.  COPD. EXAM: CHEST  2 VIEW COMPARISON:  11/28/2015 FINDINGS: The cardiac silhouette remains borderline enlarged. Aortic atherosclerosis is noted. Chronic interstitial coarsening is unchanged. No confluent airspace opacity, overt pulmonary edema, pleural effusion, pneumothorax is identified. No acute osseous abnormality is seen. IMPRESSION: COPD without evidence of active cardiopulmonary disease. Electronically Signed   By: Logan Bores M.D.   On: 04/05/2017 12:40    ASSESSMENT AND PLAN:   Active Problems:   COPD with exacerbation (HCC)  * Acute COPD exacerbation   IV Solu-Medrol, nebulizer treatment, antibiotics.  * Hypertension  Resume home medication.  * CKD stage III   Appears stable, continue  monitoring.  * Chronic diastolic congestive heart failure   Stable.  * Active smoking   Counseled to quit smoking for 4 minutes and offered nicotine patch.  All the records are reviewed and case discussed with Care Management/Social Workerr. Management plans discussed with the patient, family and they are in agreement.  CODE STATUS: full.  TOTAL TIME TAKING CARE OF THIS PATIENT: 35 minutes.    POSSIBLE D/C IN 1-2 DAYS, DEPENDING ON CLINICAL CONDITION.   Vaughan Basta M.D on 04/06/2017   Between 7am to 6pm - Pager - [phone]  After 6pm go to [email or web address] - password [email or web address] [facility]  Office  [phone]  CC: Primary care physician; [name], NP  Note: This dictation was prepared with Dragon dictation along with smaller phrase technology. Any transcriptional errors that result from this process are unintentional.

## 2017-04-06 NOTE — Care Management Important Message (Signed)
Important Message  Patient Details  Name: Katherine [name] MRN: [id number] Date of Birth: 1940/11/29   Medicare Important Message Given:  Yes    Beverly Sessions, RN 04/06/2017, 10:48 AM

## 2017-04-06 NOTE — Progress Notes (Signed)
Patient expressed concern when I brought in her hydralazine that she thought she was getting too much.  She takes hydralazine 25mg  bid at home.  We are giving her 10mg  Q8H.  Dr Anselm Jungling notified and is changing the med to 25mg  bid

## 2017-04-06 NOTE — Progress Notes (Signed)
Pharmacy Antibiotic Note  Katherine [name] is a 77 y.o. female admitted on 04/05/2017 with SOB.  Pharmacy has been consulted for ceftriaxone dosing. Patient is also ordered azithromycin.  Plan: Continue ceftriaxone 1g IV Q24hr.   Transition patient to azithromycin 500mg  PO Q24hr.   Patient's procalcitonin level < 0.1; please consider deescalating or stopping therapy.   Height: 5' (152.4 cm) Weight: 252 lb (114.3 kg) IBW/kg (Calculated) : 45.5  Temp (24hrs), Avg:98.2 F (36.8 C), Min:98 F (36.7 C), Max:98.4 F (36.9 C)  Recent Labs  Lab 04/05/17 1041 04/06/17 0500  WBC 6.6 6.5  CREATININE 1.92* 1.89*    Estimated Creatinine Clearance: 29.6 mL/min (A) (by C-G formula based on SCr of 1.89 mg/dL (H)).    Allergies  Allergen Reactions  . Bee Venom Swelling  . Enalapril Maleate Swelling  . Other     Bee stings    Antimicrobials this admission: ceftriaxone 2/21 >>  azithromycin 2/21 >>   Dose adjustments this admission: N/A  Microbiology results: N/A   Thank you for allowing pharmacy to be a part of this patient's care.  Saja Bartolini L 04/06/2017 7:02 PM

## 2017-04-07 MED ORDER — IPRATROPIUM-ALBUTEROL 0.5-2.5 (3) MG/3ML IN SOLN
3.0000 mL | Freq: Three times a day (TID) | RESPIRATORY_TRACT | Status: DC
  Administered 2017-04-08 (×2): 3 mL via RESPIRATORY_TRACT
  Filled 2017-04-07 (×2): qty 3

## 2017-04-07 MED ORDER — GUAIFENESIN ER 600 MG PO TB12
600.0000 mg | ORAL_TABLET | Freq: Two times a day (BID) | ORAL | Status: DC
  Administered 2017-04-07 – 2017-04-08 (×2): 600 mg via ORAL
  Filled 2017-04-07 (×2): qty 1

## 2017-04-07 NOTE — Progress Notes (Signed)
[place] at [place] NAME: Katherine [name]    MR#:  [id number]  DATE OF BIRTH:  Jun 27, 1940  SUBJECTIVE:  CHIEF COMPLAINT:   Chief Complaint  Patient presents with  . Shortness of Breath  . Facial Swelling   - feels some better -still has coarse cough  REVIEW OF SYSTEMS:  Review of Systems  Constitutional: Positive for malaise/fatigue. Negative for chills and fever.  HENT: Negative for ear discharge, hearing loss and nosebleeds.   Respiratory: Positive for cough and shortness of breath. Negative for wheezing.   Cardiovascular: Negative for chest pain, palpitations and leg swelling.  Gastrointestinal: Negative for abdominal pain, constipation, diarrhea, nausea and vomiting.  Genitourinary: Negative for dysuria.  Musculoskeletal: Negative for myalgias.  Neurological: Negative for dizziness, sensory change, speech change, focal weakness, seizures and headaches.  Psychiatric/Behavioral: Negative for depression and suicidal ideas.    DRUG ALLERGIES:   Allergies  Allergen Reactions  . Bee Venom Swelling  . Enalapril Maleate Swelling  . Other     Bee stings    VITALS:  Blood pressure (!) 144/80, pulse 84, temperature 98 F (36.7 C), temperature source Oral, resp. rate 20, height 5' (1.524 m), weight 114.3 kg (252 lb), SpO2 100 %.  PHYSICAL EXAMINATION:  Physical Exam  GENERAL:  77 y.o.-year-old obese patient lying in the bed with no acute distress.  EYES: Pupils equal, round, reactive to light and accommodation. No scleral icterus. Extraocular muscles intact.  HEENT: Head atraumatic, normocephalic. Oropharynx and nasopharynx clear.  NECK:  Supple, no jugular venous distention. No thyroid enlargement, no tenderness.  LUNGS: Normal breath sounds bilaterally, minimal scattered wheezing, no rales,rhonchi or crepitation. No use of accessory muscles of respiration.  CARDIOVASCULAR: S1, S2 normal. No murmurs, rubs, or gallops.    ABDOMEN: Soft, nontender, nondistended. Bowel sounds present. No organomegaly or mass.  EXTREMITIES: No pedal edema, cyanosis, or clubbing.  NEUROLOGIC: Cranial nerves II through XII are intact. Muscle strength 5/5 in all extremities. Sensation intact. Gait not checked.  PSYCHIATRIC: The patient is alert and oriented x 3.  SKIN: No obvious rash, lesion, or ulcer.    LABORATORY PANEL:   CBC Recent Labs  Lab 04/06/17 0500  WBC 6.5  HGB 12.8  HCT 39.1  PLT 220   ------------------------------------------------------------------------------------------------------------------  Chemistries  Recent Labs  Lab 04/05/17 1041 04/06/17 0500  NA 142 138  K 4.4 4.5  CL 109 108  CO2 23 18*  GLUCOSE 124* 188*  BUN 25* 30*  CREATININE 1.92* 1.89*  CALCIUM 9.5 9.4  AST 19  --   ALT 13*  --   ALKPHOS 83  --   BILITOT 0.7  --    ------------------------------------------------------------------------------------------------------------------  Cardiac Enzymes Recent Labs  Lab 04/05/17 1041  TROPONINI <0.03   ------------------------------------------------------------------------------------------------------------------  RADIOLOGY:  No results found.  EKG:   Orders placed or performed during the hospital encounter of 04/05/17  . ED EKG within 10 minutes  . ED EKG within 10 minutes    ASSESSMENT AND PLAN:   77 year old female with past medical history secondary to chronic respiratory failure from COPD on 2 L home oxygen, CK D stage III, anxiety, GERD, hypertension presents to hospital secondary to worsening shortness of breath and cough.  1. Acute on chronic COPD exacerbation-continue 2 L oxygen which is chronic -IV steroids. Continue nebs. -Pro calcitonin is negative. Add cough medications. -Discontinue Rocephin. Will continue azithromycin -Chest x-ray is negative  2. Hypertension-stable blood pressures. Monitor. -On low-dose  hydralazine  3. Diastolic  CHF-chronic, well compensated.  4. GERD-on Protonix  5. DVT prophylaxis-on Lovenox   All the records are reviewed and case discussed with Care Management/Social Workerr. Management plans discussed with the patient, family and they are in agreement.  CODE STATUS: Full Code  TOTAL TIME TAKING CARE OF THIS PATIENT: 39 minutes.   POSSIBLE D/C IN 1-2 DAYS, DEPENDING ON CLINICAL CONDITION.   Gladstone Lighter M.D on 04/07/2017 at 3:39 PM  Between 7am to 6pm - Pager - [phone]  After 6pm go to [email or web address] - password [email or web address] [facility]  Office  [phone]  CC: Primary care physician; [name], NP

## 2017-04-08 MED ORDER — GUAIFENESIN ER 600 MG PO TB12: 600.0000 mg | ORAL_TABLET | Freq: Two times a day (BID) | ORAL | 0 refills | Status: DC

## 2017-04-08 MED ORDER — AZITHROMYCIN 250 MG PO TABS: 250.0000 mg | ORAL_TABLET | Freq: Every day | ORAL | 0 refills | Status: DC

## 2017-04-08 MED ORDER — PREDNISONE 10 MG (21) PO TBPK: ORAL_TABLET | ORAL | 0 refills | Status: DC

## 2017-04-09 ENCOUNTER — Telehealth: Payer: Self-pay

## 2017-04-09 NOTE — Discharge Summary (Signed)
[place] at [place] NAME: Katherine [name]    MR#:  [id number]  DATE OF BIRTH:  September 13, 1940  DATE OF ADMISSION:  04/05/2017   ADMITTING PHYSICIAN: Saundra Shelling, MD  DATE OF DISCHARGE: 04/08/2017  5:55 PM  PRIMARY CARE PHYSICIAN: [name], NP   ADMISSION DIAGNOSIS:   COPD exacerbation ([place]) [J44.1]  DISCHARGE DIAGNOSIS:   Active Problems:   COPD with exacerbation ([place])   SECONDARY DIAGNOSIS:   Past Medical History:  Diagnosis Date  . Angioedema   . Anxiety   . Anxiety and depression   . Chronic constipation   . Chronic kidney disease    stage 3  . COPD (chronic obstructive pulmonary disease) ([place])   . Gallstones   . GERD (gastroesophageal reflux disease)   . Hypertension   . Left ventricular hypertrophy   . Osteoarthritis   . Prurigo nodularis   . Tobacco abuse   . Vitamin D deficiency disease     HOSPITAL COURSE:   77 year old female with past medical history secondary to chronic respiratory failure from COPD on 2 L home oxygen, CK D stage III, anxiety, GERD, hypertension presents to hospital secondary to worsening shortness of breath and cough.  1. Acute on chronic COPD exacerbation-continue 2 L oxygen which is chronic - received steroids and nebs. Discharged on a prednisone taper -Pro calcitonin is negative. Added cough medications. -Also on azithromycin at discharge -Chest x-ray is negative  2. Hypertension-stable blood pressures. Monitor. -On low-dose hydralazine  3. Diastolic CHF-chronic, well compensated.  4. GERD-on Prilosec  5. Depression-continue low-dose Prozac  Ambulating in the room. Feels comfortable being discharged today.    DISCHARGE CONDITIONS:   Guarded  CONSULTS OBTAINED:   None  DRUG ALLERGIES:   Allergies  Allergen Reactions  . Bee Venom Swelling  . Enalapril Maleate Swelling  . Other     Bee stings   DISCHARGE MEDICATIONS:   Allergies as of 04/08/2017    Reactions   Bee Venom Swelling   Enalapril Maleate Swelling   Other    Bee stings      Medication List    STOP taking these medications   doxycycline 100 MG tablet Commonly known as:  VIBRA-TABS   predniSONE 20 MG tablet Commonly known as:  DELTASONE Replaced by:  predniSONE 10 MG (21) Tbpk tablet     TAKE these medications   acetaminophen 650 MG CR tablet Commonly known as:  TYLENOL Take 650-1,300 mg by mouth every 8 (eight) hours as needed for pain.   albuterol 108 (90 Base) MCG/ACT inhaler Commonly known as:  PROVENTIL HFA Inhale 2 puffs into the lungs every 4 (four) hours as needed for wheezing or shortness of breath.   aspirin EC 81 MG tablet Take 81 mg by mouth daily.   azithromycin 250 MG tablet Commonly known as:  ZITHROMAX Take 1 tablet (250 mg total) by mouth daily. X 4 more days   cetirizine 10 MG tablet Commonly known as:  ZYRTEC TAKE 1/2 TABLET(5 MG) BY MOUTH AT BEDTIME   cholecalciferol 1000 units tablet Commonly known as:  VITAMIN D Take 1,000 Units by mouth daily.   FLUoxetine 10 MG capsule Commonly known as:  PROZAC TAKE 1 CAPSULE(10 MG) BY MOUTH DAILY   fluticasone furoate-vilanterol 100-25 MCG/INH Aepb Commonly known as:  BREO ELLIPTA Inhale 1 puff into the lungs daily.   guaiFENesin 600 MG 12 hr tablet Commonly known as:  MUCINEX Take 1 tablet (600 mg total)  by mouth 2 (two) times daily.   hydrALAZINE 25 MG tablet Commonly known as:  APRESOLINE TAKE 1 TABLET(25 MG) BY MOUTH TWICE DAILY   ipratropium-albuterol 0.5-2.5 (3) MG/3ML Soln Commonly known as:  DUONEB Take 3 mLs by nebulization every 6 (six) hours as needed.   meclizine 25 MG tablet Commonly known as:  ANTIVERT TAKE 1 TABLET(25 MG) BY MOUTH THREE TIMES DAILY AS NEEDED FOR DIZZINESS   omeprazole 40 MG capsule Commonly known as:  PRILOSEC Take 40 mg by mouth daily.   predniSONE 10 MG (21) Tbpk tablet Commonly known as:  STERAPRED UNI-PAK 21 TAB 6 tabs PO x 1 day 5 tabs  PO x 1 day 4 tabs PO x 1 day 3 tabs PO x 1 day 2 tabs PO x 1 day 1 tab PO x 1 day and stop Replaces:  predniSONE 20 MG tablet   SPIRIVA HANDIHALER 18 MCG inhalation capsule Generic drug:  tiotropium PLACE 1 CAPSULE INTO INHALER AND INHALE EVERY DAY What changed:  Another medication with the same name was removed. Continue taking this medication, and follow the directions you see here.        DISCHARGE INSTRUCTIONS:   1. PCP follow-up in 1-2 weeks 2. Pulmonary follow-up in 2 weeks  DIET:   Cardiac diet  ACTIVITY:   Activity as tolerated  OXYGEN:   Home Oxygen: Yes.    Oxygen Delivery: 2 liters/min via Patient connected to nasal cannula oxygen  DISCHARGE LOCATION:   home   If you experience worsening of your admission symptoms, develop shortness of breath, life threatening emergency, suicidal or homicidal thoughts you must seek medical attention immediately by calling 911 or calling your MD immediately  if symptoms less severe.  You Must read complete instructions/literature along with all the possible adverse reactions/side effects for all the Medicines you take and that have been prescribed to you. Take any new Medicines after you have completely understood and accpet all the possible adverse reactions/side effects.   Please note  You were cared for by a hospitalist during your hospital stay. If you have any questions about your discharge medications or the care you received while you were in the hospital after you are discharged, you can call the unit and asked to speak with the hospitalist on call if the hospitalist that took care of you is not available. Once you are discharged, your primary care physician will handle any further medical issues. Please note that NO REFILLS for any discharge medications will be authorized once you are discharged, as it is imperative that you return to your primary care physician (or establish a relationship with a primary care physician  if you do not have one) for your aftercare needs so that they can reassess your need for medications and monitor your lab values.    On the day of Discharge:  VITAL SIGNS:   Blood pressure (!) 147/87, pulse 96, temperature 98.2 F (36.8 C), temperature source Oral, resp. rate 19, height 5' (1.524 m), weight 114.3 kg (252 lb), SpO2 96 %.  PHYSICAL EXAMINATION:    GENERAL:  77 y.o.-year-old obese patient lying in the bed with no acute distress.  EYES: Pupils equal, round, reactive to light and accommodation. No scleral icterus. Extraocular muscles intact.  HEENT: Head atraumatic, normocephalic. Oropharynx and nasopharynx clear.  NECK:  Supple, no jugular venous distention. No thyroid enlargement, no tenderness.  LUNGS: Normal breath sounds bilaterally, minimal scattered wheezing, no rales,rhonchi or crepitation. No use of accessory muscles of  respiration.  CARDIOVASCULAR: S1, S2 normal. No murmurs, rubs, or gallops.  ABDOMEN: Soft, nontender, nondistended. Bowel sounds present. No organomegaly or mass.  EXTREMITIES: No pedal edema, cyanosis, or clubbing.  NEUROLOGIC: Cranial nerves II through XII are intact. Muscle strength 5/5 in all extremities. Sensation intact. Gait not checked.  PSYCHIATRIC: The patient is alert and oriented x 3.  SKIN: No obvious rash, lesion, or ulcer.    DATA REVIEW:   CBC Recent Labs  Lab 04/06/17 0500  WBC 6.5  HGB 12.8  HCT 39.1  PLT 220    Chemistries  Recent Labs  Lab 04/05/17 1041 04/06/17 0500  NA 142 138  K 4.4 4.5  CL 109 108  CO2 23 18*  GLUCOSE 124* 188*  BUN 25* 30*  CREATININE 1.92* 1.89*  CALCIUM 9.5 9.4  AST 19  --   ALT 13*  --   ALKPHOS 83  --   BILITOT 0.7  --      Microbiology Results  Results for orders placed or performed during the hospital encounter of 04/03/16  Body fluid culture     Status: None   Collection Time: 04/03/16 12:15 PM  Result Value Ref Range Status   Specimen Description KNEE RIGHT KNEE   Final   Special Requests NONE  Final   Gram Stain   Final    ABUNDANT WBC PRESENT, PREDOMINANTLY MONONUCLEAR NO ORGANISMS SEEN    Culture   Final    NO GROWTH 3 DAYS Performed at [facility], [place] [address] [place] [address]    Report Status 04/06/2016 FINAL  Final    RADIOLOGY:  No results found.   Management plans discussed with the patient, family and they are in agreement.  CODE STATUS:  Code Status History    Date Active Date Inactive Code Status Order ID Comments User Context   04/05/2017 14:11 04/08/2017 21:01 Full Code [id number]  Saundra Shelling, MD Inpatient   02/08/2016 14:14 02/11/2016 17:29 Full Code [id number]  Hessie Knows, MD Inpatient   11/27/2015 16:45 11/29/2015 16:28 Full Code [id number]  Idelle Crouch, MD Inpatient   02/10/2015 21:22 02/12/2015 18:09 Full Code [id number]  Lytle Butte, MD ED   09/16/2014 12:20 09/19/2014 17:36 Full Code [id number]  Aldean Jewett, MD Inpatient      TOTAL TIME TAKING CARE OF THIS PATIENT: 38 minutes.    Gladstone Lighter M.D on 04/09/2017 at 12:39 PM  Between 7am to 6pm - Pager - [phone]  After 6pm go to [email or web address] - Proofreader  [facility]  Office  [phone]  CC: Primary care physician; [name], NP   Note: This dictation was prepared with Dragon dictation along with smaller phrase technology. Any transcriptional errors that result from this process are unintentional.

## 2017-04-09 NOTE — Telephone Encounter (Signed)
Transition Care Management Follow-up Telephone Call  Spoke with patients husband    Date discharged? 04/08/2017   How have you been since you were released from the hospital? "doing well"   Do you understand why you were in the hospital? yes   Do you understand the discharge instructions? yes   Where were you discharged to? home   Items Reviewed:  Medications reviewed: yes  Allergies reviewed: yes  Dietary changes reviewed: yes  Referrals reviewed: yes   Functional Questionnaire:   Activities of Daily Living (ADLs):   She states they are independent in the following: ambulation, bathing and hygiene, feeding, continence, grooming, toileting and dressing States they require assistance with the following: none   Any transportation issues/concerns?: no   Any patient concerns? no   Confirmed importance and date/time of follow-up visits scheduled yes  Provider Appointment booked with [name] for 04/18/2017 at 8:00am   Confirmed with patient if condition begins to worsen call PCP or go to the ER.  Patient was given the office number and encouraged to call back with question or concerns.  : yes

## 2017-04-10 ENCOUNTER — Ambulatory Visit: Payer: Medicare HMO | Admitting: Pulmonary Disease

## 2017-04-10 ENCOUNTER — Encounter: Payer: Self-pay | Admitting: Pulmonary Disease

## 2017-04-10 VITALS — BP 128/80 | HR 109 | Ht 60.0 in | Wt 243.0 lb

## 2017-04-10 DIAGNOSIS — J9611 Chronic respiratory failure with hypoxia: Secondary | ICD-10-CM

## 2017-04-10 DIAGNOSIS — J9612 Chronic respiratory failure with hypercapnia: Secondary | ICD-10-CM

## 2017-04-10 DIAGNOSIS — F17200 Nicotine dependence, unspecified, uncomplicated: Secondary | ICD-10-CM | POA: Diagnosis not present

## 2017-04-10 DIAGNOSIS — J441 Chronic obstructive pulmonary disease with (acute) exacerbation: Secondary | ICD-10-CM

## 2017-04-10 NOTE — Progress Notes (Signed)
PULMONARY OFFICE FOLLOW UP NOTE  Requesting MD/Service: [name], NP Date of initial consultation: 01/20/16 Reason for consultation:  Chronic hypoxemic respiratory failure, COPD, OHS  PT PROFILE: 77 y.o. F smoker referred for evaluation of chronic hypoxemic respiratory failure COPD, OHS.   DATA: 02/12/15 CT chest:No evidence of pulmonary embolism. No evidence of acute cardiopulmonary disease 06/21/15 spirometry: FVC: 1.82 L (85 %pred), FEV1:  1.38 L (83 %pred), FEV1/FVC: 75%   INTERVAL: Hospitalized 2/21-2/24/19 discharge diagnosis of COPD exacerbation  SUBJ:  This is a post hospital follow-up.  She was hospitalized as documented above.  She was not seen by pulmonary medicine during that hospitalization.  She is completing a prednisone taper and a course of azithromycin.  She continues to smoke 1 cigarette/day per her report.  She continues to wear oxygen with sleep and exertion.  She denies cough and sputum production.  She denies hemoptysis and chest pain. Exertional dyspnea has not changed. she is remains very sedentary.  Vitals:   04/10/17 0940 04/10/17 0945  BP:  128/80  Pulse:  (!) 109  SpO2:  98%  Weight: 110.2 kg (243 lb)   Height: 5' (1.524 m)   2 LPM [place] pulse   EXAM:  Gen: Obese, No overt respiratory distress HEENT: NCAT, sclera white, oropharynx normal Neck:  no evident LAN, JVD Lungs: Moderately diminished without wheezes or other adventitious sounds Cardiovascular: distant HS, RRR, no murmurs noted Abdomen: Obese, soft, nontender, normal BS Ext: without clubbing, cyanosis, edema Neuro: grossly intact Skin: Limited exam, no lesions noted  DATA:   BMP Latest Ref Rng & Units 04/06/2017 04/05/2017 03/21/2017  Glucose 65 - 99 mg/dL 188(H) 124(H) 80  BUN 6 - 20 mg/dL 30(H) 25(H) 19  Creatinine 0.44 - 1.00 mg/dL 1.89(H) 1.92(H) 1.64(H)  BUN/Creat Ratio 12 - 28 - - 12  Sodium 135 - 145 mmol/L 138 142 142  Potassium 3.5 - 5.1 mmol/L 4.5 4.4 5.1  Chloride 101 -  111 mmol/L 108 109 104  CO2 22 - 32 mmol/L 18(L) 23 24  Calcium 8.9 - 10.3 mg/dL 9.4 9.5 10.0    CBC Latest Ref Rng & Units 04/06/2017 04/05/2017 03/27/2017  WBC 3.6 - 11.0 K/uL 6.5 6.6 7.4  Hemoglobin 12.0 - 16.0 g/dL 12.8 14.0 12.6  Hematocrit 35.0 - 47.0 % 39.1 42.9 37.9  Platelets 150 - 440 K/uL 220 220 252    CXR 04/05/17: No acute cardiac or pulmonary findings  IMPRESSION:     ICD-10-CM   1. Chronic respiratory failure with hypoxia and hypercapnia J96.11    J96.12   2. Recent COPD exacerbation J44.1   3. Severe obesity E66.01   4. Smoker F17.200      PLAN:  I again emphasized the importance of complete smoking cessation Continue Breo, Spiriva, DuoNeb, albuterol as currently prescribed Continue oxygen therapy with exertion and sleep We again discussed the benefits of weight loss and she appears committed to working on this Follow-up in 3-4 months   Merton Border, MD PCCM service Mobile [phone] Pager [phone] 04/10/2017 4:41 PM

## 2017-04-10 NOTE — Patient Instructions (Signed)
Continue Breo inhaler and Spiriva inhaler Continue DuoNeb as needed Continue oxygen therapy as close to 24 hours/day as possible I have strongly recommended complete smoking cessation Follow-up in 3-4 months or sooner as needed

## 2017-04-12 ENCOUNTER — Encounter: Payer: Self-pay | Admitting: Nurse Practitioner

## 2017-04-12 ENCOUNTER — Ambulatory Visit
Admission: RE | Admit: 2017-04-12 | Discharge: 2017-04-12 | Disposition: A | Discharge status: Home / Self Care | Payer: Medicare HMO | Source: Ambulatory Visit | Attending: Nurse Practitioner | Admitting: Nurse Practitioner

## 2017-04-12 ENCOUNTER — Inpatient Hospital Stay: Payer: Medicare HMO | Attending: Nurse Practitioner | Admitting: Nurse Practitioner

## 2017-04-12 DIAGNOSIS — Z122 Encounter for screening for malignant neoplasm of respiratory organs: Secondary | ICD-10-CM | POA: Diagnosis not present

## 2017-04-12 DIAGNOSIS — Z87891 Personal history of nicotine dependence: Secondary | ICD-10-CM | POA: Insufficient documentation

## 2017-04-12 DIAGNOSIS — I700 Atherosclerosis of aorta: Secondary | ICD-10-CM | POA: Insufficient documentation

## 2017-04-12 DIAGNOSIS — I2510 Atherosclerotic heart disease of native coronary artery without angina pectoris: Secondary | ICD-10-CM | POA: Insufficient documentation

## 2017-04-12 DIAGNOSIS — J439 Emphysema, unspecified: Secondary | ICD-10-CM | POA: Insufficient documentation

## 2017-04-12 DIAGNOSIS — I281 Aneurysm of pulmonary artery: Secondary | ICD-10-CM | POA: Insufficient documentation

## 2017-04-12 DIAGNOSIS — F17210 Nicotine dependence, cigarettes, uncomplicated: Secondary | ICD-10-CM | POA: Diagnosis not present

## 2017-04-12 DIAGNOSIS — J449 Chronic obstructive pulmonary disease, unspecified: Secondary | ICD-10-CM | POA: Diagnosis not present

## 2017-04-12 NOTE — Progress Notes (Signed)
In accordance with CMS guidelines, patient has met eligibility criteria including age, absence of signs or symptoms of lung cancer.  Social History   Tobacco Use  . Smoking status: Current Every Day Smoker    Packs/day: 0.50    Years: 60.00    Pack years: 30.00    Types: Cigarettes  . Smokeless tobacco: Never Used  Substance Use Topics  . Alcohol use: No    Alcohol/week: 0.0 oz    Comment: rare  . Drug use: No     A shared decision-making session was conducted prior to the performance of CT scan. This includes one or more decision aids, includes benefits and harms of screening, follow-up diagnostic testing, over-diagnosis, false positive rate, and total radiation exposure.  Counseling on the importance of adherence to annual lung cancer LDCT screening, impact of co-morbidities, and ability or willingness to undergo diagnosis and treatment is imperative for compliance of the program.  Counseling on the importance of continued smoking cessation for former smokers; the importance of smoking cessation for current smokers, and information about tobacco cessation interventions have been given to patient including [place] and [facility] quit [place] programs.  Written order for lung cancer screening with LDCT has been given to the patient and any and all questions have been answered to the best of my abilities.   Yearly follow up will be coordinated by Burgess Estelle, Thoracic Navigator.  Beckey Rutter, DNP, AGNP-C [place] at [facility] [phone] (office) 04/12/17 1:54 PM

## 2017-04-13 ENCOUNTER — Encounter: Payer: Self-pay | Admitting: *Deleted

## 2017-04-16 DIAGNOSIS — R809 Proteinuria, unspecified: Secondary | ICD-10-CM | POA: Diagnosis not present

## 2017-04-16 DIAGNOSIS — F17200 Nicotine dependence, unspecified, uncomplicated: Secondary | ICD-10-CM | POA: Diagnosis not present

## 2017-04-16 DIAGNOSIS — N183 Chronic kidney disease, stage 3 (moderate): Secondary | ICD-10-CM | POA: Diagnosis not present

## 2017-04-16 DIAGNOSIS — I129 Hypertensive chronic kidney disease with stage 1 through stage 4 chronic kidney disease, or unspecified chronic kidney disease: Secondary | ICD-10-CM | POA: Diagnosis not present

## 2017-04-16 DIAGNOSIS — E1122 Type 2 diabetes mellitus with diabetic chronic kidney disease: Secondary | ICD-10-CM | POA: Diagnosis not present

## 2017-04-16 DIAGNOSIS — I720 Aneurysm of carotid artery: Secondary | ICD-10-CM | POA: Diagnosis not present

## 2017-04-16 DIAGNOSIS — D631 Anemia in chronic kidney disease: Secondary | ICD-10-CM | POA: Diagnosis not present

## 2017-04-16 DIAGNOSIS — L940 Localized scleroderma [morphea]: Secondary | ICD-10-CM | POA: Diagnosis not present

## 2017-04-16 DIAGNOSIS — E875 Hyperkalemia: Secondary | ICD-10-CM | POA: Diagnosis not present

## 2017-04-16 DIAGNOSIS — Z720 Tobacco use: Secondary | ICD-10-CM | POA: Diagnosis not present

## 2017-04-18 ENCOUNTER — Inpatient Hospital Stay: Payer: Self-pay | Admitting: Unknown Physician Specialty

## 2017-04-20 ENCOUNTER — Other Ambulatory Visit: Payer: Self-pay

## 2017-04-20 ENCOUNTER — Observation Stay
Admission: EM | Admit: 2017-04-20 | Discharge: 2017-04-22 | Disposition: A | Discharge status: Home / Self Care | Payer: Medicare HMO | Attending: Internal Medicine | Admitting: Internal Medicine

## 2017-04-20 ENCOUNTER — Emergency Department: Payer: Medicare HMO

## 2017-04-20 DIAGNOSIS — G4733 Obstructive sleep apnea (adult) (pediatric): Secondary | ICD-10-CM | POA: Insufficient documentation

## 2017-04-20 DIAGNOSIS — F17210 Nicotine dependence, cigarettes, uncomplicated: Secondary | ICD-10-CM | POA: Insufficient documentation

## 2017-04-20 DIAGNOSIS — R0602 Shortness of breath: Secondary | ICD-10-CM | POA: Diagnosis not present

## 2017-04-20 DIAGNOSIS — F329 Major depressive disorder, single episode, unspecified: Secondary | ICD-10-CM | POA: Insufficient documentation

## 2017-04-20 DIAGNOSIS — Z9981 Dependence on supplemental oxygen: Secondary | ICD-10-CM | POA: Insufficient documentation

## 2017-04-20 DIAGNOSIS — Z7982 Long term (current) use of aspirin: Secondary | ICD-10-CM | POA: Diagnosis not present

## 2017-04-20 DIAGNOSIS — E559 Vitamin D deficiency, unspecified: Secondary | ICD-10-CM | POA: Diagnosis not present

## 2017-04-20 DIAGNOSIS — N183 Chronic kidney disease, stage 3 unspecified: Secondary | ICD-10-CM | POA: Diagnosis present

## 2017-04-20 DIAGNOSIS — R079 Chest pain, unspecified: Secondary | ICD-10-CM | POA: Diagnosis not present

## 2017-04-20 DIAGNOSIS — I129 Hypertensive chronic kidney disease with stage 1 through stage 4 chronic kidney disease, or unspecified chronic kidney disease: Secondary | ICD-10-CM | POA: Insufficient documentation

## 2017-04-20 DIAGNOSIS — M351 Other overlap syndromes: Secondary | ICD-10-CM | POA: Insufficient documentation

## 2017-04-20 DIAGNOSIS — K219 Gastro-esophageal reflux disease without esophagitis: Secondary | ICD-10-CM | POA: Diagnosis not present

## 2017-04-20 DIAGNOSIS — J441 Chronic obstructive pulmonary disease with (acute) exacerbation: Principal | ICD-10-CM | POA: Insufficient documentation

## 2017-04-20 DIAGNOSIS — Z79899 Other long term (current) drug therapy: Secondary | ICD-10-CM | POA: Diagnosis not present

## 2017-04-20 DIAGNOSIS — I10 Essential (primary) hypertension: Secondary | ICD-10-CM | POA: Diagnosis present

## 2017-04-20 DIAGNOSIS — I2510 Atherosclerotic heart disease of native coronary artery without angina pectoris: Secondary | ICD-10-CM | POA: Diagnosis present

## 2017-04-20 DIAGNOSIS — Z23 Encounter for immunization: Secondary | ICD-10-CM | POA: Diagnosis not present

## 2017-04-20 DIAGNOSIS — K5909 Other constipation: Secondary | ICD-10-CM | POA: Insufficient documentation

## 2017-04-20 DIAGNOSIS — Z66 Do not resuscitate: Secondary | ICD-10-CM | POA: Insufficient documentation

## 2017-04-20 DIAGNOSIS — F419 Anxiety disorder, unspecified: Secondary | ICD-10-CM | POA: Insufficient documentation

## 2017-04-20 DIAGNOSIS — Z6841 Body Mass Index (BMI) 40.0 and over, adult: Secondary | ICD-10-CM | POA: Diagnosis not present

## 2017-04-20 DIAGNOSIS — E1159 Type 2 diabetes mellitus with other circulatory complications: Secondary | ICD-10-CM | POA: Diagnosis present

## 2017-04-20 LAB — COMPREHENSIVE METABOLIC PANEL WITH GFR
ALT: 15 U/L (ref 14–54)
AST: 21 U/L (ref 15–41)
Albumin: 3.3 g/dL — ABNORMAL LOW (ref 3.5–5.0)
Alkaline Phosphatase: 71 U/L (ref 38–126)
Anion gap: 9 (ref 5–15)
BUN: 20 mg/dL (ref 6–20)
CO2: 25 mmol/L (ref 22–32)
Calcium: 9.4 mg/dL (ref 8.9–10.3)
Chloride: 108 mmol/L (ref 101–111)
Creatinine, Ser: 1.69 mg/dL — ABNORMAL HIGH (ref 0.44–1.00)
GFR calc Af Amer: 33 mL/min — ABNORMAL LOW (ref >60)
GFR calc non Af Amer: 28 mL/min — ABNORMAL LOW (ref >60)
Glucose, Bld: 120 mg/dL — ABNORMAL HIGH (ref 65–99)
Potassium: 4.8 mmol/L (ref 3.5–5.1)
Sodium: 142 mmol/L (ref 135–145)
Total Bilirubin: 0.5 mg/dL (ref 0.3–1.2)
Total Protein: 7.4 g/dL (ref 6.5–8.1)

## 2017-04-20 LAB — TROPONIN I: Troponin I: <0.03 ng/mL (ref <0.03)

## 2017-04-20 LAB — CBC
HCT: 39 % (ref 35.0–47.0)
Hemoglobin: 12.9 g/dL (ref 12.0–16.0)
MCH: 31.4 pg (ref 26.0–34.0)
MCHC: 33 g/dL (ref 32.0–36.0)
MCV: 95.1 fL (ref 80.0–100.0)
Platelets: 233 K/uL (ref 150–440)
RBC: 4.1 MIL/uL (ref 3.80–5.20)
RDW: 15.9 % — ABNORMAL HIGH (ref 11.5–14.5)
WBC: 7.2 K/uL (ref 3.6–11.0)

## 2017-04-20 LAB — BRAIN NATRIURETIC PEPTIDE: B NATRIURETIC PEPTIDE 5: 130 pg/mL — AB (ref 0.0–100.0)

## 2017-04-20 MED ORDER — IPRATROPIUM-ALBUTEROL 0.5-2.5 (3) MG/3ML IN SOLN
3.0000 mL | Freq: Once | RESPIRATORY_TRACT | Status: AC
  Administered 2017-04-20: 3 mL via RESPIRATORY_TRACT
  Filled 2017-04-20: qty 6

## 2017-04-20 MED ORDER — IPRATROPIUM-ALBUTEROL 0.5-2.5 (3) MG/3ML IN SOLN
3.0000 mL | Freq: Once | RESPIRATORY_TRACT | Status: AC
  Administered 2017-04-20: 3 mL via RESPIRATORY_TRACT

## 2017-04-20 MED ORDER — IPRATROPIUM-ALBUTEROL 0.5-2.5 (3) MG/3ML IN SOLN
3.0000 mL | Freq: Once | RESPIRATORY_TRACT | Status: AC
  Administered 2017-04-20: 3 mL via RESPIRATORY_TRACT
  Filled 2017-04-20: qty 3

## 2017-04-20 MED ORDER — METHYLPREDNISOLONE SODIUM SUCC 125 MG IJ SOLR
125.0000 mg | Freq: Once | INTRAMUSCULAR | Status: AC
  Administered 2017-04-20: 125 mg via INTRAVENOUS
  Filled 2017-04-20: qty 2

## 2017-04-20 NOTE — ED Triage Notes (Signed)
Pt c/o shortness of breath and chest pain.

## 2017-04-20 NOTE — ED Notes (Signed)
ED TO INPATIENT HANDOFF REPORT  Name/Age/Gender Katherine [name] 77 y.o. female  Code Status Code Status History    Date Active Date Inactive Code Status Order ID Comments User Context   04/05/2017 14:11 04/08/2017 21:01 Full Code [id number]  Saundra Shelling, MD Inpatient   02/08/2016 14:14 02/11/2016 17:29 Full Code [id number]  Hessie Knows, MD Inpatient   11/27/2015 16:45 11/29/2015 16:28 Full Code [id number]  Idelle Crouch, MD Inpatient   02/10/2015 21:22 02/12/2015 18:09 Full Code [id number]  Lytle Butte, MD ED   09/16/2014 12:20 09/19/2014 17:36 Full Code [id number]  Aldean Jewett, MD Inpatient      Home/SNF/Other Home  Chief Complaint Diff Breathing  Level of Care/Admitting Diagnosis ED Disposition    ED Disposition Condition Comment   Admit  The patient appears reasonably stabilized for admission considering the current resources, flow, and capabilities available in the ED at this time, and I doubt any other [facility] requiring further screening and/or treatment in the ED prior to admission is  present.       Medical History Past Medical History:  Diagnosis Date  . Angioedema   . Anxiety   . Anxiety and depression   . Chronic constipation   . Chronic kidney disease    stage 3  . COPD (chronic obstructive pulmonary disease) ([place])   . Gallstones   . GERD (gastroesophageal reflux disease)   . Hypertension   . Left ventricular hypertrophy   . Osteoarthritis   . Prurigo nodularis   . Tobacco abuse   . Vitamin D deficiency disease     Allergies Allergies  Allergen Reactions  . Bee Venom Swelling  . Enalapril Maleate Swelling  . Other     Bee stings    IV Location/Drains/Wounds Patient Lines/Drains/Airways Status   Active Line/Drains/Airways    Name:   Placement date:   Placement time:   Site:   Days:   Peripheral IV 04/20/17 Left Hand   04/20/17    2030    Hand   less than 1   Peripheral IV 04/20/17 Right;Upper Arm   04/20/17    2047    Arm   less than 1          Labs/Imaging Results for orders placed or performed during the hospital encounter of 04/20/17 (from the past 48 hour(s))  CBC     Status: Abnormal   Collection Time: 04/20/17  8:08 PM  Result Value Ref Range   WBC 7.2 3.6 - 11.0 K/uL   RBC 4.10 3.80 - 5.20 MIL/uL   Hemoglobin 12.9 12.0 - 16.0 g/dL   HCT 39.0 35.0 - 47.0 %   MCV 95.1 80.0 - 100.0 fL   MCH 31.4 26.0 - 34.0 pg   MCHC 33.0 32.0 - 36.0 g/dL   RDW 15.9 (H) 11.5 - 14.5 %   Platelets 233 150 - 440 K/uL    Comment: Performed at [facility], [address] [place], [place] [address]  Comprehensive metabolic panel     Status: Abnormal   Collection Time: 04/20/17  8:08 PM  Result Value Ref Range   Sodium 142 135 - 145 mmol/L   Potassium 4.8 3.5 - 5.1 mmol/L    Comment: HEMOLYSIS AT THIS LEVEL MAY AFFECT RESULT   Chloride 108 101 - 111 mmol/L   CO2 25 22 - 32 mmol/L   Glucose, Bld 120 (H) 65 - 99 mg/dL   BUN 20 6 - 20 mg/dL   Creatinine, Ser 1.69 (H)  0.44 - 1.00 mg/dL   Calcium 9.4 8.9 - 10.3 mg/dL   Total Protein 7.4 6.5 - 8.1 g/dL   Albumin 3.3 (L) 3.5 - 5.0 g/dL   AST 21 15 - 41 U/L   ALT 15 14 - 54 U/L   Alkaline Phosphatase 71 38 - 126 U/L   Total Bilirubin 0.5 0.3 - 1.2 mg/dL   GFR calc non Af Amer 28 (L) >60 mL/min   GFR calc Af Amer 33 (L) >60 mL/min    Comment: (NOTE) The eGFR has been calculated using the CKD EPI equation. This calculation has not been validated in all clinical situations. eGFR's persistently <60 mL/min signify possible Chronic Kidney Disease.    Anion gap 9 5 - 15    Comment: Performed at [facility], [address] [place], [place] [address]  Troponin I     Status: None   Collection Time: 04/20/17  8:08 PM  Result Value Ref Range   Troponin I <0.03 <0.03 ng/mL    Comment: Performed at [facility], [address] [place], [place] [address]  Brain natriuretic peptide     Status: Abnormal   Collection Time: 04/20/17  9:31 PM  Result Value  Ref Range   B Natriuretic Peptide 130.0 (H) 0.0 - 100.0 pg/mL    Comment: Performed at [facility], [address], [place], [place] [address]   Dg Chest Portable 1 View  Result Date: 04/20/2017 CLINICAL DATA:  Shortness of breath.  Chest pain. EXAM: PORTABLE CHEST 1 VIEW COMPARISON:  04/05/2017 FINDINGS: Cardiac silhouette is normal in size. No mediastinal or hilar masses. No convincing adenopathy. There are prominent bronchovascular markings without change from the prior chest radiograph. No evidence of pneumonia or pulmonary edema. No pleural effusion or pneumothorax. Skeletal structures are grossly intact. IMPRESSION: No acute cardiopulmonary disease. Electronically Signed   By: Lajean Manes M.D.   On: 04/20/2017 20:23    Pending Labs Unresulted Labs (From admission, onward)   None      Vitals/Pain Today's Vitals   04/20/17 2200 04/20/17 2215 04/20/17 2230 04/20/17 2300  BP: 135/89  129/80 (!) 154/98  Pulse: 80 79 71 76  Resp: (!) 25 (!) 22 (!) 30 (!) 21  Temp:      SpO2: 97% 97% 98% 97%  Weight:      Height:      PainSc:        Isolation Precautions No active isolations  Medications Medications  methylPREDNISolone sodium succinate (SOLU-MEDROL) 125 mg/2 mL injection 125 mg (125 mg Intravenous Given 04/20/17 2027)  ipratropium-albuterol (DUONEB) 0.5-2.5 (3) MG/3ML nebulizer solution 3 mL (3 mLs Nebulization Given 04/20/17 2026)  ipratropium-albuterol (DUONEB) 0.5-2.5 (3) MG/3ML nebulizer solution 3 mL (3 mLs Nebulization Given 04/20/17 2026)    Mobility walks with device (walker)

## 2017-04-20 NOTE — ED Provider Notes (Signed)
[facility] Emergency Department Provider Note  Time seen: 8:06 PM  I have reviewed the triage vital signs and the nursing notes.   HISTORY  Chief Complaint Shortness of Breath and Chest Pain    HPI Katherine [name] is a 77 y.o. female with a past medical history of angioedema, anxiety, CKD, COPD, hypertension, presents to the emergency department for difficulty breathing.  According to the patient she has had worsening trouble breathing over the past 1 week, was discharged from the hospital last week after COPD exacerbation has progressively worsened since then acutely worse today however along with chest pain today.  Describes the pain is a moderate tightness sensation.  Patient is in moderate respiratory distress sitting upright in bed with audible wheeze speaking in 1-2 word sentences.  Largely negative review of systems otherwise besides mild diarrhea.   Past Medical History:  Diagnosis Date  . Angioedema   . Anxiety   . Anxiety and depression   . Chronic constipation   . Chronic kidney disease    stage 3  . COPD (chronic obstructive pulmonary disease) ([place])   . Gallstones   . GERD (gastroesophageal reflux disease)   . Hypertension   . Left ventricular hypertrophy   . Osteoarthritis   . Prurigo nodularis   . Tobacco abuse   . Vitamin D deficiency disease     Patient Active Problem List   Diagnosis Date Noted  . COPD with exacerbation ([place]) 04/05/2017  . Hand pain 03/21/2017  . Dry skin 03/21/2017  . Pain in finger of left hand 09/12/2016  . Chronic fatigue 06/12/2016  . Left knee pain 06/12/2016  . Advanced care planning/counseling discussion 03/13/2016  . DNR (do not resuscitate) 03/13/2016  . Primary localized osteoarthritis of right knee 02/08/2016  . OSA and COPD overlap syndrome ([place]) 09/16/2015  . Rotator cuff syndrome 09/07/2015  . 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 Sleep disturbance 04/14/2015  . Coronary artery disease 03/14/2015  .  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 Anxiety   . Tobacco abuse   . Prurigo nodularis   . Hypertensive heart disease   . GERD (gastroesophageal reflux disease)   . COPD (chronic obstructive pulmonary disease) ([place])   . Osteoarthritis   . Vitamin D deficiency disease   . Chronic constipation   . Chronic kidney disease   . LVH (left ventricular hypertrophy) 05/20/2013  . Essential hypertension 05/20/2013  . Morbid obesity ([place]) 05/20/2013    Past Surgical History:  Procedure Laterality Date  . CHOLECYSTECTOMY    . 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   Procedure: TOTAL KNEE ARTHROPLASTY;  Surgeon: Hessie Knows, MD;  Location: [facility] ORS;  Service: Orthopedics;  Laterality: Right;    Prior to Admission medications   Medication Sig Start Date End Date Taking? Authorizing Provider  acetaminophen (TYLENOL) 650 MG CR tablet Take 650-1,300 mg by mouth every 8 (eight) hours as needed for pain.     [provider]  albuterol (PROVENTIL HFA) 108 (90 Base) MCG/ACT inhaler Inhale 2 puffs into the lungs every 4 (four) hours as needed for wheezing or shortness of breath. 03/13/16   [name], NP  aspirin EC 81 MG tablet Take 81 mg by mouth daily.    [provider]  azithromycin (ZITHROMAX) 250 MG tablet Take 1 tablet (250 mg total) by mouth daily. X 4 more days 04/08/17   Gladstone Lighter, MD  cetirizine (ZYRTEC) 10 MG tablet TAKE 1/2 TABLET(5 MG) BY MOUTH AT BEDTIME 03/26/17   [name], NP  cholecalciferol (VITAMIN D) 1000 units tablet Take 1,000 Units by mouth daily.    [provider]  FLUoxetine (PROZAC) 10 MG capsule TAKE 1 CAPSULE(10 MG) BY MOUTH DAILY 01/30/17   [name], NP  fluticasone furoate-vilanterol (BREO ELLIPTA) 100-25 MCG/INH AEPB Inhale 1 puff into the lungs daily. 04/27/16   [name], MD  guaiFENesin (MUCINEX) 600 MG 12 hr tablet Take 1 tablet (600 mg  total) by mouth 2 (two) times daily. 04/08/17   Gladstone Lighter, MD  hydrALAZINE (APRESOLINE) 25 MG tablet TAKE 1 TABLET(25 MG) BY MOUTH TWICE DAILY 01/08/17   Guadalupe Maple, MD  ipratropium-albuterol (DUONEB) 0.5-2.5 (3) MG/3ML SOLN Take 3 mLs by nebulization every 6 (six) hours as needed. 11/29/15   [name], MD  meclizine (ANTIVERT) 25 MG tablet TAKE 1 TABLET(25 MG) BY MOUTH THREE TIMES DAILY AS NEEDED FOR DIZZINESS 10/27/16   Guadalupe Maple, MD  omeprazole (PRILOSEC) 40 MG capsule Take 40 mg by mouth daily.    [provider]  predniSONE (STERAPRED UNI-PAK 21 TAB) 10 MG (21) TBPK tablet 6 tabs PO x 1 day 5 tabs PO x 1 day 4 tabs PO x 1 day 3 tabs PO x 1 day 2 tabs PO x 1 day 1 tab PO x 1 day and stop 04/08/17   Gladstone Lighter, MD  [facility] HANDIHALER 18 MCG inhalation capsule PLACE 1 CAPSULE INTO INHALER AND INHALE EVERY DAY 03/26/17   [name], NP    Allergies  Allergen Reactions  . Bee Venom Swelling  . Enalapril Maleate Swelling  . Other     Bee stings    Family History  Problem Relation Age of Onset  . Alcohol abuse Mother   . Sickle cell anemia Daughter   . Hypertension Son   . Cancer Neg Hx   . COPD Neg Hx   . Diabetes Neg Hx   . Heart disease Neg Hx   . Stroke Neg Hx     Social History Social History   Tobacco Use  . Smoking status: Current Every Day Smoker    Packs/day: 0.50    Years: 60.00    Pack years: 30.00    Types: Cigarettes  . Smokeless tobacco: Never Used  Substance Use Topics  . Alcohol use: No    Alcohol/week: 0.0 oz    Comment: rare  . Drug use: No    Review of Systems Constitutional: Negative for fever. Eyes: Negative for visual complaints ENT: Negative for recent illness/congestion Cardiovascular: Moderate chest tightness Respiratory: Significant shortness of breath times 1 week worse over the past 24 hours. Gastrointestinal: Negative for abdominal pain, vomiting.  States mild diarrhea. Genitourinary:  Negative for urinary compaints Musculoskeletal: Negative for musculoskeletal complaints Skin: Negative for skin complaints  Neurological: Negative for headache All other ROS negative  ____________________________________________   PHYSICAL EXAM:  Constitutional: Alert and oriented.  Moderate respiratory distress sitting upright in bed with audible wheeze speaking in 1-2 word sentences. Eyes: Normal exam ENT   Head: Normocephalic and atraumatic.   Mouth/Throat: Mucous membranes are moist. Cardiovascular: Normal rate, regular rhythm.  Respiratory: Moderate respiratory distress with tachypnea, decreased breath sounds bilaterally with moderate expiratory wheeze bilaterally. Gastrointestinal: Soft and nontender. No distention.   Musculoskeletal: Nontender with normal range of motion in all extremities. No lower extremity edema. Neurologic:  Normal speech and language. No gross focal neurologic deficits  Skin:  Skin is warm, dry and intact.  Psychiatric: Mood and affect are normal.   ____________________________________________    EKG  EKG reviewed and interpreted by myself shows sinus rhythm at 99 bpm with a narrow QRS, normal axis, normal intervals, no concerning ST changes.  ____________________________________________    RADIOLOGY  X-ray negative  ____________________________________________   INITIAL IMPRESSION / ASSESSMENT AND PLAN / ED COURSE  Pertinent labs & imaging results that were available during my care of the patient were reviewed by me and considered in my medical decision making (see chart for details).  Patient presents to the emergency department for respiratory distress.  Currently satting around 90% on room air.  States she has 2 L of oxygen to use at home as needed.  Audible wheeze during exam with 1-2 word sentences.  Differential would include COPD exacerbation, pneumonia, pneumothorax, ACS.  We will check labs, chest x-ray, treat with  Solu-Medrol, duo nebs and continue to closely monitor in the emergency department.  Patient's x-ray is clear, labs are largely at baseline.  Troponin negative.  Patient is feeling better after breathing treatments no longer has a wheeze but continues to have a grunting speaking in 2-3 word sentences.  I discussed possibilities including admission versus discharge, patient strongly feels she needs to be admitted to the hospital.  I believe this is reasonable given her recent admission and significant respiratory distress upon arrival.  Will admit to the hospitalist service for further treatment.  ____________________________________________   FINAL CLINICAL IMPRESSION(S) / ED DIAGNOSES  COPD exacerbation    Harvest Dark, MD 04/20/17 2259

## 2017-04-20 NOTE — H&P (Addendum)
[facility] [place] NAME: Katherine [name]    MR#:  [id number]  DATE OF BIRTH:  1940/11/16  DATE OF ADMISSION:  04/20/2017  PRIMARY CARE PHYSICIAN: [name], NP   REQUESTING/REFERRING PHYSICIAN: Kerman Passey, MD  CHIEF COMPLAINT:   Chief Complaint  Patient presents with  . Shortness of Breath  . Chest Pain    HISTORY OF PRESENT ILLNESS:  Katherine [name]  is a 77 y.o. female who presents with shortness of breath.  Patient has a history of COPD.  She states that for the past couple days her breathing is been getting worse, specifically much worse since last night.  She came to the ED today for evaluation and workup here initially is consistent with COPD exacerbation.  She received upfront treatment for COPD and hospitalist were called for admission  PAST MEDICAL HISTORY:   Past Medical History:  Diagnosis Date  . Angioedema   . Anxiety   . Anxiety and depression   . Chronic constipation   . Chronic kidney disease    stage 3  . COPD (chronic obstructive pulmonary disease) ([place])   . Gallstones   . GERD (gastroesophageal reflux disease)   . Hypertension   . Left ventricular hypertrophy   . Osteoarthritis   . Prurigo nodularis   . Tobacco abuse   . Vitamin D deficiency disease      PAST SURGICAL HISTORY:   Past Surgical History:  Procedure Laterality Date  . CHOLECYSTECTOMY    . 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   Procedure: TOTAL KNEE ARTHROPLASTY;  Surgeon: Hessie Knows, MD;  Location: [facility] ORS;  Service: Orthopedics;  Laterality: Right;     SOCIAL HISTORY:   Social History   Tobacco Use  . Smoking status: Current Every Day Smoker    Packs/day: 0.50    Years: 60.00    Pack years: 30.00    Types: Cigarettes  . Smokeless tobacco: Never Used  Substance Use Topics  . Alcohol use: No    Alcohol/week: 0.0 oz    Comment: rare     FAMILY HISTORY:   Family  History  Problem Relation Age of Onset  . Alcohol abuse Mother   . Sickle cell anemia Daughter   . Hypertension Son   . Cancer Neg Hx   . COPD Neg Hx   . Diabetes Neg Hx   . Heart disease Neg Hx   . Stroke Neg Hx      DRUG ALLERGIES:   Allergies  Allergen Reactions  . Bee Venom Swelling  . Enalapril Maleate Swelling  . Other     Bee stings    MEDICATIONS AT HOME:   Prior to Admission medications   Medication Sig Start Date End Date Taking? Authorizing Provider  acetaminophen (TYLENOL) 650 MG CR tablet Take 650-1,300 mg by mouth every 8 (eight) hours as needed for pain.    Yes [provider]  albuterol (PROVENTIL HFA) 108 (90 Base) MCG/ACT inhaler Inhale 2 puffs into the lungs every 4 (four) hours as needed for wheezing or shortness of breath. 03/13/16  Yes [name], NP  aspirin EC 81 MG tablet Take 81 mg by mouth daily.   Yes [provider]  cetirizine (ZYRTEC) 10 MG tablet TAKE 1/2 TABLET(5 MG) BY MOUTH AT BEDTIME 03/26/17  Yes [name], NP  cholecalciferol (VITAMIN D) 1000 units tablet Take 1,000 Units by mouth daily.   Yes  [provider]  FLUoxetine (PROZAC) 10 MG capsule TAKE 1 CAPSULE(10 MG) BY MOUTH DAILY 01/30/17  Yes [name], NP  fluticasone furoate-vilanterol (BREO ELLIPTA) 100-25 MCG/INH AEPB Inhale 1 puff into the lungs daily. 04/27/16  Yes [name], MD  guaiFENesin (MUCINEX) 600 MG 12 hr tablet Take 1 tablet (600 mg total) by mouth 2 (two) times daily. 04/08/17  Yes Gladstone Lighter, MD  hydrALAZINE (APRESOLINE) 25 MG tablet TAKE 1 TABLET(25 MG) BY MOUTH TWICE DAILY 01/08/17  Yes [name], [name], MD  ipratropium-albuterol (DUONEB) 0.5-2.5 (3) MG/3ML SOLN Take 3 mLs by nebulization every 6 (six) hours as needed. 11/29/15  Yes [name], MD  meclizine (ANTIVERT) 25 MG tablet TAKE 1 TABLET(25 MG) BY MOUTH THREE TIMES DAILY AS NEEDED FOR DIZZINESS 10/27/16  Yes [name], [name], MD  omeprazole (PRILOSEC) 40 MG  capsule Take 40 mg by mouth daily.   Yes [provider]  SPIRIVA HANDIHALER 18 MCG inhalation capsule PLACE 1 CAPSULE INTO INHALER AND INHALE EVERY DAY 03/26/17  Yes [name], NP    REVIEW OF SYSTEMS:  Review of Systems  Constitutional: Negative for chills, fever, malaise/fatigue and weight loss.  HENT: Negative for ear pain, hearing loss and tinnitus.   Eyes: Negative for blurred vision, double vision, pain and redness.  Respiratory: Positive for cough, shortness of breath and wheezing. Negative for hemoptysis.   Cardiovascular: Negative for chest pain, palpitations, orthopnea and leg swelling.  Gastrointestinal: Negative for abdominal pain, constipation, diarrhea, nausea and vomiting.  Genitourinary: Negative for dysuria, frequency and hematuria.  Musculoskeletal: Negative for back pain, joint pain and neck pain.  Skin:       No acne, rash, or lesions  Neurological: Negative for dizziness, tremors, focal weakness and weakness.  Endo/Heme/Allergies: Negative for polydipsia. Does not bruise/bleed easily.  Psychiatric/Behavioral: Negative for depression. The patient is not nervous/anxious and does not have insomnia.      VITAL SIGNS:   Vitals:   04/20/17 2200 04/20/17 2215 04/20/17 2230 04/20/17 2300  BP: 135/89  129/80 (!) 154/98  Pulse: 80 79 71 76  Resp: (!) 25 (!) 22 (!) 30 (!) 21  Temp:      SpO2: 97% 97% 98% 97%  Weight:      Height:       Wt Readings from Last 3 Encounters:  04/20/17 104.3 kg (230 lb)  04/12/17 104.3 kg (230 lb)  04/10/17 110.2 kg (243 lb)    PHYSICAL EXAMINATION:  Physical Exam  Vitals reviewed. Constitutional: She is oriented to person, place, and time. She appears well-developed and well-nourished. No distress.  HENT:  Head: Normocephalic and atraumatic.  Mouth/Throat: Oropharynx is clear and moist.  Eyes: Conjunctivae and EOM are normal. Pupils are equal, round, and reactive to light. No scleral icterus.  Neck: Normal range of  motion. Neck supple. No JVD present. No thyromegaly present.  Cardiovascular: Normal rate, regular rhythm and intact distal pulses. Exam reveals no gallop and no friction rub.  No murmur heard. Respiratory: Effort normal. No respiratory distress. She has wheezes. She has no rales.  GI: Soft. Bowel sounds are normal. She exhibits no distension. There is no tenderness.  Musculoskeletal: Normal range of motion. She exhibits no edema.  No arthritis, no gout  Lymphadenopathy:    She has no cervical adenopathy.  Neurological: She is alert and oriented to person, place, and time. No cranial nerve deficit.  No dysarthria, no aphasia  Skin: Skin is warm and dry. No rash noted. No  erythema.  Psychiatric: She has a normal mood and affect. Her behavior is normal. Judgment and thought content normal.    LABORATORY PANEL:   CBC Recent Labs  Lab 04/20/17 2008  WBC 7.2  HGB 12.9  HCT 39.0  PLT 233   ------------------------------------------------------------------------------------------------------------------  Chemistries  Recent Labs  Lab 04/20/17 2008  NA 142  K 4.8  CL 108  CO2 25  GLUCOSE 120*  BUN 20  CREATININE 1.69*  CALCIUM 9.4  AST 21  ALT 15  ALKPHOS 71  BILITOT 0.5   ------------------------------------------------------------------------------------------------------------------  Cardiac Enzymes Recent Labs  Lab 04/20/17 2008  TROPONINI <0.03   ------------------------------------------------------------------------------------------------------------------  RADIOLOGY:  Dg Chest Portable 1 View  Result Date: 04/20/2017 CLINICAL DATA:  Shortness of breath.  Chest pain. EXAM: PORTABLE CHEST 1 VIEW COMPARISON:  04/05/2017 FINDINGS: Cardiac silhouette is normal in size. No mediastinal or hilar masses. No convincing adenopathy. There are prominent bronchovascular markings without change from the prior chest radiograph. No evidence of pneumonia or pulmonary edema. No  pleural effusion or pneumothorax. Skeletal structures are grossly intact. IMPRESSION: No acute cardiopulmonary disease. Electronically Signed   By: Lajean Manes M.D.   On: 04/20/2017 20:23    EKG:   Orders placed or performed during the hospital encounter of 04/20/17  . ED EKG  . ED EKG  . EKG 12-Lead  . EKG 12-Lead  . EKG 12-Lead  . EKG 12-Lead    IMPRESSION AND PLAN:  Principal Problem:   COPD with exacerbation ([place]) -patient was given IV Solu-Medrol and duo nebs in the ED.  We will give her another dose of IV Solu-Medrol and then switch her to p.o. prednisone.  Will also use azithromycin with as needed nebs PRN antitussive. Active Problems:   Essential hypertension -continue home medications   Coronary artery disease -continue home meds   OSA and COPD overlap syndrome (HCC) -CPAP nightly   GERD (gastroesophageal reflux disease) -home dose PPI   CKD (chronic kidney disease), stage III ([place]) -at baseline, avoid nephrotoxins and monitor  Chart review performed and case discussed with ED provider. Labs, imaging and ECG reviewed by provider and discussed with patient/family. Management plans discussed with the patient and/or family.  DVT PROPHYLAXIS: SubQ heparin  GI PROPHYLAXIS: PPI  ADMISSION STATUS: Observation  CODE STATUS: DNR - confirmed by patient and chart review, patient PCP [name] has verified DNR status as recently as 03/21/17, see notes in chart review Code Status History    Date Active Date Inactive Code Status Order ID Comments User Context   04/05/2017 14:11 04/08/2017 21:01 Full Code [id number]  Saundra Shelling, MD Inpatient   02/08/2016 14:14 02/11/2016 17:29 Full Code [id number]  Hessie Knows, MD Inpatient   11/27/2015 16:45 11/29/2015 16:28 Full Code [id number]  Idelle Crouch, MD Inpatient   02/10/2015 21:22 02/12/2015 18:09 Full Code [id number]  Lytle Butte, MD ED   09/16/2014 12:20 09/19/2014 17:36 Full Code [id number]  Aldean Jewett, MD Inpatient       TOTAL TIME TAKING CARE OF THIS PATIENT: 40 minutes.   Stryker Veasey Boyd 04/20/2017, 11:55 PM  [facility]  Office  [phone]  CC: Primary care physician; [name], NP  Note:  This document was prepared using Dragon voice recognition software and may include unintentional dictation errors.

## 2017-04-21 ENCOUNTER — Other Ambulatory Visit: Payer: Self-pay

## 2017-04-21 DIAGNOSIS — J441 Chronic obstructive pulmonary disease with (acute) exacerbation: Secondary | ICD-10-CM | POA: Diagnosis not present

## 2017-04-21 DIAGNOSIS — I10 Essential (primary) hypertension: Secondary | ICD-10-CM | POA: Diagnosis not present

## 2017-04-21 DIAGNOSIS — K219 Gastro-esophageal reflux disease without esophagitis: Secondary | ICD-10-CM | POA: Diagnosis not present

## 2017-04-21 DIAGNOSIS — I2510 Atherosclerotic heart disease of native coronary artery without angina pectoris: Secondary | ICD-10-CM | POA: Diagnosis not present

## 2017-04-21 MED ORDER — PNEUMOCOCCAL VAC POLYVALENT 25 MCG/0.5ML IJ INJ
0.5000 mL | INJECTION | INTRAMUSCULAR | Status: AC
  Administered 2017-04-22: 0.5 mL via INTRAMUSCULAR
  Filled 2017-04-21: qty 0.5

## 2017-04-21 MED ORDER — ACETAMINOPHEN 325 MG PO TABS
650.0000 mg | ORAL_TABLET | Freq: Four times a day (QID) | ORAL | Status: DC | PRN
Start: 2017-04-21 — End: 2017-04-22

## 2017-04-21 MED ORDER — IPRATROPIUM-ALBUTEROL 0.5-2.5 (3) MG/3ML IN SOLN: 3.0000 mL | RESPIRATORY_TRACT | Status: DC | PRN

## 2017-04-21 MED ORDER — AZITHROMYCIN 250 MG PO TABS
500.0000 mg | ORAL_TABLET | Freq: Every day | ORAL | Status: DC
  Administered 2017-04-22: 500 mg via ORAL
  Filled 2017-04-21: qty 2

## 2017-04-21 MED ORDER — ONDANSETRON HCL 4 MG/2ML IJ SOLN
4.0000 mg | Freq: Four times a day (QID) | INTRAMUSCULAR | Status: DC | PRN
  Administered 2017-04-22: 4 mg via INTRAVENOUS
  Filled 2017-04-21: qty 2

## 2017-04-21 MED ORDER — HEPARIN SODIUM (PORCINE) 5000 UNIT/ML IJ SOLN
5000.0000 [IU] | Freq: Three times a day (TID) | INTRAMUSCULAR | Status: DC
  Administered 2017-04-21 – 2017-04-22 (×4): 5000 [IU] via SUBCUTANEOUS
  Filled 2017-04-21 (×4): qty 1

## 2017-04-21 MED ORDER — FLUTICASONE FUROATE-VILANTEROL 100-25 MCG/INH IN AEPB
1.0000 | INHALATION_SPRAY | Freq: Every day | RESPIRATORY_TRACT | Status: DC
  Administered 2017-04-21 – 2017-04-22 (×2): 1 via RESPIRATORY_TRACT
  Filled 2017-04-21: qty 28

## 2017-04-21 MED ORDER — PREDNISONE 50 MG PO TABS
50.0000 mg | ORAL_TABLET | Freq: Every day | ORAL | Status: DC
  Administered 2017-04-21 – 2017-04-22 (×2): 50 mg via ORAL
  Filled 2017-04-21 (×4): qty 1

## 2017-04-21 MED ORDER — METHYLPREDNISOLONE SODIUM SUCC 125 MG IJ SOLR
60.0000 mg | Freq: Once | INTRAMUSCULAR | Status: AC
  Administered 2017-04-21: 60 mg via INTRAVENOUS
  Filled 2017-04-21: qty 2

## 2017-04-21 MED ORDER — ACETAMINOPHEN 650 MG RE SUPP: 650.0000 mg | Freq: Four times a day (QID) | RECTAL | Status: DC | PRN

## 2017-04-21 MED ORDER — PANTOPRAZOLE SODIUM 40 MG PO TBEC
40.0000 mg | DELAYED_RELEASE_TABLET | Freq: Every day | ORAL | Status: DC
  Administered 2017-04-21 – 2017-04-22 (×2): 40 mg via ORAL
  Filled 2017-04-21 (×2): qty 1

## 2017-04-21 MED ORDER — TIOTROPIUM BROMIDE MONOHYDRATE 18 MCG IN CAPS
18.0000 ug | ORAL_CAPSULE | Freq: Every day | RESPIRATORY_TRACT | Status: DC
  Administered 2017-04-21 – 2017-04-22 (×2): 18 ug via RESPIRATORY_TRACT
  Filled 2017-04-21: qty 5

## 2017-04-21 MED ORDER — FLUOXETINE HCL 10 MG PO CAPS
10.0000 mg | ORAL_CAPSULE | Freq: Every day | ORAL | Status: DC
  Administered 2017-04-21 – 2017-04-22 (×2): 10 mg via ORAL
  Filled 2017-04-21 (×2): qty 1

## 2017-04-21 MED ORDER — INFLUENZA VAC SPLIT HIGH-DOSE 0.5 ML IM SUSY
0.5000 mL | PREFILLED_SYRINGE | INTRAMUSCULAR | Status: AC
  Administered 2017-04-22: 0.5 mL via INTRAMUSCULAR
  Filled 2017-04-21: qty 0.5

## 2017-04-21 MED ORDER — AZITHROMYCIN 250 MG PO TABS
500.0000 mg | ORAL_TABLET | Freq: Once | ORAL | Status: AC
  Administered 2017-04-21: 500 mg via ORAL
  Filled 2017-04-21: qty 2

## 2017-04-21 MED ORDER — ASPIRIN EC 81 MG PO TBEC
81.0000 mg | DELAYED_RELEASE_TABLET | Freq: Every day | ORAL | Status: DC
  Administered 2017-04-21 – 2017-04-22 (×2): 81 mg via ORAL
  Filled 2017-04-21 (×2): qty 1

## 2017-04-21 MED ORDER — HYDRALAZINE HCL 25 MG PO TABS
25.0000 mg | ORAL_TABLET | Freq: Two times a day (BID) | ORAL | Status: DC
  Administered 2017-04-21 – 2017-04-22 (×3): 25 mg via ORAL
  Filled 2017-04-21 (×3): qty 1

## 2017-04-21 MED ORDER — ONDANSETRON HCL 4 MG PO TABS: 4.0000 mg | ORAL_TABLET | Freq: Four times a day (QID) | ORAL | Status: DC | PRN

## 2017-04-21 NOTE — ED Notes (Signed)
Admitting Provider at the bedside.

## 2017-04-21 NOTE — Progress Notes (Signed)
[facility] [place] NAME: Katherine [name]    MR#:  [id number]  DATE OF BIRTH:  1940-07-22  SUBJECTIVE:  In with shortness of breath.  Feels a little better.  Eating food.  No respiratory distress noted.  Has oxygen which he uses as needed  REVIEW OF SYSTEMS:   Review of Systems  Constitutional: Negative for chills, fever and weight loss.  HENT: Negative for ear discharge, ear pain and nosebleeds.   Eyes: Negative for blurred vision, pain and discharge.  Respiratory: Positive for cough and shortness of breath. Negative for sputum production, wheezing and stridor.   Cardiovascular: Negative for chest pain, palpitations, orthopnea and PND.  Gastrointestinal: Negative for abdominal pain, diarrhea, nausea and vomiting.  Genitourinary: Negative for frequency and urgency.  Musculoskeletal: Negative for back pain and joint pain.  Neurological: Positive for weakness. Negative for sensory change, speech change and focal weakness.  Psychiatric/Behavioral: Negative for depression and hallucinations. The patient is not nervous/anxious.    Tolerating Diet:yes Tolerating PT:   DRUG ALLERGIES:   Allergies  Allergen Reactions  . Bee Venom Swelling  . Enalapril Maleate Swelling  . Other     Bee stings    VITALS:  Blood pressure (!) 159/74, pulse 74, temperature 98.2 F (36.8 C), temperature source Oral, resp. rate 20, height 5' (1.524 m), weight 111.5 kg (245 lb 12.8 oz), SpO2 98 %.  PHYSICAL EXAMINATION:   Physical Exam  GENERAL:  77 y.o.-year-old patient lying in the bed with no acute distress.obese  EYES: Pupils equal, round, reactive to light and accommodation. No scleral icterus. Extraocular muscles intact.  HEENT: Head atraumatic, normocephalic. Oropharynx and nasopharynx clear.  NECK:  Supple, no jugular venous distention. No thyroid enlargement, no tenderness.  LUNGS: Normal breath sounds bilaterally, no wheezing, rales, rhonchi.  No use of accessory muscles of respiration.  CARDIOVASCULAR: S1, S2 normal. No murmurs, rubs, or gallops.  ABDOMEN: Soft, nontender, nondistended. Bowel sounds present. No organomegaly or mass.  EXTREMITIES: No cyanosis, clubbing or edema b/l.    NEUROLOGIC: Cranial nerves II through XII are intact. No focal Motor or sensory deficits b/l.   PSYCHIATRIC:  patient is alert and oriented x 3.  SKIN: No obvious rash, lesion, or ulcer.   LABORATORY PANEL:  CBC Recent Labs  Lab 04/20/17 2008  WBC 7.2  HGB 12.9  HCT 39.0  PLT 233    Chemistries  Recent Labs  Lab 04/20/17 2008  NA 142  K 4.8  CL 108  CO2 25  GLUCOSE 120*  BUN 20  CREATININE 1.69*  CALCIUM 9.4  AST 21  ALT 15  ALKPHOS 71  BILITOT 0.5   Cardiac Enzymes Recent Labs  Lab 04/20/17 2008  TROPONINI <0.03   RADIOLOGY:  Dg Chest Portable 1 View  Result Date: 04/20/2017 CLINICAL DATA:  Shortness of breath.  Chest pain. EXAM: PORTABLE CHEST 1 VIEW COMPARISON:  04/05/2017 FINDINGS: Cardiac silhouette is normal in size. No mediastinal or hilar masses. No convincing adenopathy. There are prominent bronchovascular markings without change from the prior chest radiograph. No evidence of pneumonia or pulmonary edema. No pleural effusion or pneumothorax. Skeletal structures are grossly intact. IMPRESSION: No acute cardiopulmonary disease. Electronically Signed   By: Lajean Manes M.D.   On: 04/20/2017 20:23   ASSESSMENT AND PLAN:  Latiqua Daloia  is a 77 y.o. female who presents with shortness of breath.  Patient has a history of COPD.  She states that for the past  couple days her breathing is been getting worse, specifically much worse since last night  1.COPD with exacerbation ([place])  -recieved IV Solu-Medrol and duo nebs - cont p.o. prednisone.  Will also use azithromycin with as needed nebs PRN antitussive.  2.  Essential hypertension -continue home medications    3.Coronary artery disease -continue home meds    4.OSA  and COPD overlap syndrome (HCC) -CPAP nightly    5.GERD (gastroesophageal reflux disease) -home dose PPI    6.CKD (chronic kidney disease), stage III ([place]) -at baseline, avoid nephrotoxins and monitor  Patient continues to improve will discharge to home tomorrow.  Patient agreeable  Case discussed with Care Management/Social Worker. Management plans discussed with the patient, family and they are in agreement.  CODE STATUS: DNR  DVT Prophylaxis: scd, lovenox  TOTAL TIME TAKING CARE OF THIS PATIENT: *30* minutes.  >50% time spent on counselling and coordination of care  POSSIBLE D/C IN *1 DAYS, DEPENDING ON CLINICAL CONDITION.  Note: This dictation was prepared with Dragon dictation along with smaller phrase technology. Any transcriptional errors that result from this process are unintentional.  [name] M.D on 04/21/2017 at 1:37 PM  Between 7am to 6pm - Pager - [phone]  After 6pm go to [email or web address] - password [email or web address] [facility]  Office  [phone]  CC: Primary care physician; [name], NPPatient ID: [name], female   DOB: 1940/07/08, 77 y.o.   MRN: [id number]

## 2017-04-21 NOTE — Therapy (Signed)
Pt refused auto cpap.  She is wearing 2 lpm [place] saturation 95%.   Pt states she has a machine at home and does not use it regularly.  She does not want to be charged for the hospital unit.  She said if she stays tomorrow she may have family bring hers from home. Pt in no distress.

## 2017-04-22 DIAGNOSIS — J441 Chronic obstructive pulmonary disease with (acute) exacerbation: Secondary | ICD-10-CM | POA: Diagnosis not present

## 2017-04-22 DIAGNOSIS — I10 Essential (primary) hypertension: Secondary | ICD-10-CM | POA: Diagnosis not present

## 2017-04-22 DIAGNOSIS — I2510 Atherosclerotic heart disease of native coronary artery without angina pectoris: Secondary | ICD-10-CM | POA: Diagnosis not present

## 2017-04-22 DIAGNOSIS — K219 Gastro-esophageal reflux disease without esophagitis: Secondary | ICD-10-CM | POA: Diagnosis not present

## 2017-04-22 MED ORDER — PREDNISONE 10 MG PO TABS: ORAL_TABLET | ORAL | 0 refills | Status: DC

## 2017-04-22 MED ORDER — AZITHROMYCIN 250 MG PO TABS: ORAL_TABLET | ORAL | 0 refills | Status: DC

## 2017-04-22 NOTE — Discharge Summary (Signed)
[facility] [name] at [place] NAME: Katherine [name]    MR#:  [id number]  DATE OF BIRTH:  Aug 02, 1940  DATE OF ADMISSION:  04/20/2017 ADMITTING PHYSICIAN: Lance Coon, MD  DATE OF DISCHARGE:04/22/2017  PRIMARY CARE PHYSICIAN: [name], NP    ADMISSION DIAGNOSIS:  COPD exacerbation ([place]) [J44.1]  DISCHARGE DIAGNOSIS:  Acute on chronic COPD exacerbation. Chronic home oxygen use. SECONDARY DIAGNOSIS:   Past Medical History:  Diagnosis Date  . Angioedema   . Anxiety   . Anxiety and depression   . Chronic constipation   . Chronic kidney disease    stage 3  . COPD (chronic obstructive pulmonary disease) ([place])   . Gallstones   . GERD (gastroesophageal reflux disease)   . Hypertension   . Left ventricular hypertrophy   . Osteoarthritis   . Prurigo nodularis   . Tobacco abuse   . Vitamin D deficiency disease     HOSPITAL COURSE:   Katherine [name] a12 y.o.femalewho presents with shortness of breath. Patient has a history of COPD. She states that for the past couple days her breathing is been getting worse, specifically much worse since last night  1.COPD with exacerbation ([place])  -recieved IV Solu-Medrol and duo nebs--change to po steroid taper -  Cont azithromycin  And with as needed nebs PRN antitussive. -uses chronic home oxygen  2.Essential hypertension -continue home medications  3.Coronary artery disease -continue home meds  4.OSA and COPD overlap syndrome (HCC) -CPAP nightly  5.GERD (gastroesophageal reflux disease) -home dose PPI  6.CKD (chronic kidney disease), stage III (HCC) -at baseline, avoid nephrotoxins and monitor  Overall improving.  Appears at baseline.  Will discharge to home.  CONSULTS OBTAINED:    DRUG ALLERGIES:   Allergies  Allergen Reactions  . Bee Venom Swelling  . Enalapril Maleate Swelling  . Other     Bee stings    DISCHARGE MEDICATIONS:   Allergies as of 04/22/2017      Reactions   Bee Venom Swelling   Enalapril Maleate Swelling   Other    Bee stings      Medication List    TAKE these medications   acetaminophen 650 MG CR tablet Commonly known as:  TYLENOL Take 650-1,300 mg by mouth every 8 (eight) hours as needed for pain.   albuterol 108 (90 Base) MCG/ACT inhaler Commonly known as:  PROVENTIL HFA Inhale 2 puffs into the lungs every 4 (four) hours as needed for wheezing or shortness of breath.   aspirin EC 81 MG tablet Take 81 mg by mouth daily.   azithromycin 250 MG tablet Commonly known as:  ZITHROMAX Take daily as directed   cetirizine 10 MG tablet Commonly known as:  ZYRTEC TAKE 1/2 TABLET(5 MG) BY MOUTH AT BEDTIME   cholecalciferol 1000 units tablet Commonly known as:  VITAMIN D Take 1,000 Units by mouth daily.   FLUoxetine 10 MG capsule Commonly known as:  PROZAC TAKE 1 CAPSULE(10 MG) BY MOUTH DAILY   fluticasone furoate-vilanterol 100-25 MCG/INH Aepb Commonly known as:  BREO ELLIPTA Inhale 1 puff into the lungs daily.   guaiFENesin 600 MG 12 hr tablet Commonly known as:  MUCINEX Take 1 tablet (600 mg total) by mouth 2 (two) times daily.   hydrALAZINE 25 MG tablet Commonly known as:  APRESOLINE TAKE 1 TABLET(25 MG) BY MOUTH TWICE DAILY   ipratropium-albuterol 0.5-2.5 (3) MG/3ML Soln Commonly known as:  DUONEB Take 3 mLs by nebulization every 6 (six) hours as needed.  meclizine 25 MG tablet Commonly known as:  ANTIVERT TAKE 1 TABLET(25 MG) BY MOUTH THREE TIMES DAILY AS NEEDED FOR DIZZINESS   omeprazole 40 MG capsule Commonly known as:  PRILOSEC Take 40 mg by mouth daily.   predniSONE 10 MG tablet Commonly known as:  DELTASONE Take 50 mg daily taper by 10 mg daily and then stop Start taking on:  04/23/2017   SPIRIVA HANDIHALER 18 MCG inhalation capsule Generic drug:  tiotropium PLACE 1 CAPSULE INTO INHALER AND INHALE EVERY DAY       If you experience worsening of your admission symptoms, develop  shortness of breath, life threatening emergency, suicidal or homicidal thoughts you must seek medical attention immediately by calling 911 or calling your MD immediately  if symptoms less severe.  You Must read complete instructions/literature along with all the possible adverse reactions/side effects for all the Medicines you take and that have been prescribed to you. Take any new Medicines after you have completely understood and accept all the possible adverse reactions/side effects.   Please note  You were cared for by a hospitalist during your hospital stay. If you have any questions about your discharge medications or the care you received while you were in the hospital after you are discharged, you can call the unit and asked to speak with the hospitalist on call if the hospitalist that took care of you is not available. Once you are discharged, your primary care physician will handle any further medical issues. Please note that NO REFILLS for any discharge medications will be authorized once you are discharged, as it is imperative that you return to your primary care physician (or establish a relationship with a primary care physician if you do not have one) for your aftercare needs so that they can reassess your need for medications and monitor your lab values. Today   SUBJECTIVE   Doing well Appears at baseline  VITAL SIGNS:  Blood pressure 136/67, pulse 92, temperature 97.7 F (36.5 C), temperature source Oral, resp. rate 20, height 5' (1.524 m), weight 111.5 kg (245 lb 12.8 oz), SpO2 97 %.  I/O:    Intake/Output Summary (Last 24 hours) at 04/22/2017 0840 Last data filed at 04/22/2017 0626 Gross per 24 hour  Intake 1080 ml  Output 1200 ml  Net -120 ml    PHYSICAL EXAMINATION:  GENERAL:  77 y.o.-year-old patient lying in the bed with no acute distress. obese EYES: Pupils equal, round, reactive to light and accommodation. No scleral icterus. Extraocular muscles intact.  HEENT:  Head atraumatic, normocephalic. Oropharynx and nasopharynx clear.  NECK:  Supple, no jugular venous distention. No thyroid enlargement, no tenderness.  LUNGS coarse breath sounds bilaterally, no wheezing, rales,rhonchi or crepitation. No use of accessory muscles of respiration.  CARDIOVASCULAR: S1, S2 normal. No murmurs, rubs, or gallops.  ABDOMEN: Soft, non-tender, non-distended. Bowel sounds present. No organomegaly or mass.  EXTREMITIES: No pedal edema, cyanosis, or clubbing.  NEUROLOGIC: Cranial nerves II through XII are intact. Muscle strength 5/5 in all extremities. Sensation intact. Gait not checked.  PSYCHIATRIC: The patient is alert and oriented x 3.  SKIN: No obvious rash, lesion, or ulcer.   DATA REVIEW:   CBC  Recent Labs  Lab 04/20/17 2008  WBC 7.2  HGB 12.9  HCT 39.0  PLT 233    Chemistries  Recent Labs  Lab 04/20/17 2008  NA 142  K 4.8  CL 108  CO2 25  GLUCOSE 120*  BUN 20  CREATININE 1.69*  CALCIUM 9.4  AST 21  ALT 15  ALKPHOS 71  BILITOT 0.5    Microbiology Results   No results found for this or any previous visit (from the past 240 hour(s)).  RADIOLOGY:  Dg Chest Portable 1 View  Result Date: 04/20/2017 CLINICAL DATA:  Shortness of breath.  Chest pain. EXAM: PORTABLE CHEST 1 VIEW COMPARISON:  04/05/2017 FINDINGS: Cardiac silhouette is normal in size. No mediastinal or hilar masses. No convincing adenopathy. There are prominent bronchovascular markings without change from the prior chest radiograph. No evidence of pneumonia or pulmonary edema. No pleural effusion or pneumothorax. Skeletal structures are grossly intact. IMPRESSION: No acute cardiopulmonary disease. Electronically Signed   By: Lajean Manes M.D.   On: 04/20/2017 20:23     Management plans discussed with the patient, family and they are in agreement.  CODE STATUS:     Code Status Orders  (From admission, onward)        Start     Ordered   04/21/17 0118  Do not attempt  resuscitation (DNR)  Continuous    Question Answer Comment  In the event of cardiac or respiratory ARREST Do not call a "code blue"   In the event of cardiac or respiratory ARREST Do not perform Intubation, CPR, defibrillation or ACLS   In the event of cardiac or respiratory ARREST Use medication by any route, position, wound care, and other measures to relive pain and suffering. May use oxygen, suction and manual treatment of airway obstruction as needed for comfort.      04/21/17 0117    Code Status History    Date Active Date Inactive Code Status Order ID Comments User Context   04/05/2017 14:11 04/08/2017 21:01 Full Code [id number]  Saundra Shelling, MD Inpatient   02/08/2016 14:14 02/11/2016 17:29 Full Code [id number]  Hessie Knows, MD Inpatient   11/27/2015 16:45 11/29/2015 16:28 Full Code [id number]  Idelle Crouch, MD Inpatient   02/10/2015 21:22 02/12/2015 18:09 Full Code [id number]  Lytle Butte, MD ED   09/16/2014 12:20 09/19/2014 17:36 Full Code [id number]  Aldean Jewett, MD Inpatient      TOTAL TIME TAKING CARE OF THIS PATIENT: *40* minutes.    [name] M.D on 04/22/2017 at 8:40 AM  Between 7am to 6pm - Pager - [phone] After 6pm go to [email or web address] - password [email or web address] [facility]  Office  [phone]  CC: Primary care physician; [name], NP

## 2017-04-23 ENCOUNTER — Inpatient Hospital Stay: Payer: Self-pay | Admitting: Unknown Physician Specialty

## 2017-04-25 ENCOUNTER — Other Ambulatory Visit: Payer: Self-pay | Admitting: Family Medicine

## 2017-04-25 ENCOUNTER — Encounter: Payer: Self-pay | Admitting: Family Medicine

## 2017-04-25 ENCOUNTER — Other Ambulatory Visit: Payer: Self-pay | Admitting: Unknown Physician Specialty

## 2017-04-25 ENCOUNTER — Ambulatory Visit (INDEPENDENT_AMBULATORY_CARE_PROVIDER_SITE_OTHER): Payer: Medicare HMO | Admitting: Family Medicine

## 2017-04-25 VITALS — BP 130/80 | HR 87 | Temp 98.5°F | Wt 251.8 lb

## 2017-04-25 DIAGNOSIS — J432 Centrilobular emphysema: Secondary | ICD-10-CM

## 2017-04-25 MED ORDER — ALBUTEROL SULFATE HFA 108 (90 BASE) MCG/ACT IN AERS
2.0000 | INHALATION_SPRAY | RESPIRATORY_TRACT | 5 refills | Status: DC | PRN
Start: 2017-04-25 — End: 2017-05-30

## 2017-04-25 MED ORDER — BENZONATATE 200 MG PO CAPS: 200.0000 mg | ORAL_CAPSULE | Freq: Two times a day (BID) | ORAL | 0 refills | Status: DC | PRN

## 2017-04-25 MED ORDER — PREDNISONE 10 MG PO TABS: ORAL_TABLET | ORAL | 0 refills | Status: DC

## 2017-04-25 NOTE — Progress Notes (Signed)
0000000  °

## 2017-04-25 NOTE — Progress Notes (Signed)
BP 130/80 (BP Location: Right Arm, Patient Position: Sitting, Cuff Size: Large)   Pulse 87   Temp 98.5 F (36.9 C) (Oral)   Wt 251 lb 12.8 oz (114.2 kg)   SpO2 98%   BMI 49.18 kg/m    Subjective:    Patient ID: Katherine [name], female    DOB: September 23, 1940, 77 y.o.   MRN: [id number]  HPI: Katherine [name] is a 77 y.o. female  Chief Complaint  Patient presents with  . Hospitalization Follow-up    Patient states she is feeling better, no complaints.  [name] COPD   Pt here today for hospital f/u for COPD exacerbation. Was sent home on azithromycin and prednisone, feels her breathing is just about back to baseline now. Still using home oxygen, with similar requirements to pre-hospital needs. Home inhaler regimen doing well. Still having some productive cough. Has 2 days left on her medications from discharge. No fevers, chills, sweats, CP since d/c.   Relevant past medical, surgical, family and social history reviewed and updated as indicated. Interim medical history since our last visit reviewed. Allergies and medications reviewed and updated.  Review of Systems  Per HPI unless specifically indicated above     Objective:    BP 130/80 (BP Location: Right Arm, Patient Position: Sitting, Cuff Size: Large)   Pulse 87   Temp 98.5 F (36.9 C) (Oral)   Wt 251 lb 12.8 oz (114.2 kg)   SpO2 98%   BMI 49.18 kg/m   Wt Readings from Last 3 Encounters:  04/25/17 251 lb 12.8 oz (114.2 kg)  04/21/17 245 lb 12.8 oz (111.5 kg)  04/12/17 230 lb (104.3 kg)    Physical Exam  Constitutional: She is oriented to person, place, and time. She appears well-developed and well-nourished. No distress.  HENT:  Head: Atraumatic.  Right Ear: External ear normal.  Left Ear: External ear normal.  Nose: Nose normal.  Mouth/Throat: Oropharynx is clear and moist. No oropharyngeal exudate.  Eyes: Conjunctivae are normal. Pupils are equal, round, and reactive to light.  Neck: Normal range of motion. Neck supple.   Cardiovascular: Normal rate and normal heart sounds.  Pulmonary/Chest: Effort normal. No respiratory distress.  Decreased breath sounds b/l   Musculoskeletal: Normal range of motion.  Neurological: She is alert and oriented to person, place, and time.  Skin: Skin is warm and dry.  Psychiatric: She has a normal mood and affect. Her behavior is normal.  Nursing note and vitals reviewed.   Results for orders placed or performed during the hospital encounter of 04/20/17  CBC  Result Value Ref Range   WBC 7.2 3.6 - 11.0 K/uL   RBC 4.10 3.80 - 5.20 MIL/uL   Hemoglobin 12.9 12.0 - 16.0 g/dL   HCT 39.0 35.0 - 47.0 %   MCV 95.1 80.0 - 100.0 fL   MCH 31.4 26.0 - 34.0 pg   MCHC 33.0 32.0 - 36.0 g/dL   RDW 15.9 (H) 11.5 - 14.5 %   Platelets 233 150 - 440 K/uL  Comprehensive metabolic panel  Result Value Ref Range   Sodium 142 135 - 145 mmol/L   Potassium 4.8 3.5 - 5.1 mmol/L   Chloride 108 101 - 111 mmol/L   CO2 25 22 - 32 mmol/L   Glucose, Bld 120 (H) 65 - 99 mg/dL   BUN 20 6 - 20 mg/dL   Creatinine, Ser 1.69 (H) 0.44 - 1.00 mg/dL   Calcium 9.4 8.9 - 10.3 mg/dL   Total Protein 7.4  6.5 - 8.1 g/dL   Albumin 3.3 (L) 3.5 - 5.0 g/dL   AST 21 15 - 41 U/L   ALT 15 14 - 54 U/L   Alkaline Phosphatase 71 38 - 126 U/L   Total Bilirubin 0.5 0.3 - 1.2 mg/dL   GFR calc non Af Amer 28 (L) >60 mL/min   GFR calc Af Amer 33 (L) >60 mL/min   Anion gap 9 5 - 15  Troponin I  Result Value Ref Range   Troponin I <0.03 <0.03 ng/mL  Brain natriuretic peptide  Result Value Ref Range   B Natriuretic Peptide 130.0 (H) 0.0 - 100.0 pg/mL      Assessment & Plan:   Problem List Items Addressed This Visit      Respiratory   COPD (chronic obstructive pulmonary disease) ([place]) - Primary    Significantly improved since d/c. Will add additional course of prednisone as well as some tessalon for her cough. Complete remaining abx and home inhaler regimen. F/u if worsening or no improvement      Relevant  Medications   benzonatate (TESSALON) 200 MG capsule   albuterol (PROVENTIL HFA) 108 (90 Base) MCG/ACT inhaler   predniSONE (DELTASONE) 10 MG tablet       Follow up plan: Return in about 5 months (around 09/25/2017) for 6 month f/u.

## 2017-04-26 ENCOUNTER — Other Ambulatory Visit: Payer: Self-pay | Admitting: Family Medicine

## 2017-04-26 NOTE — Telephone Encounter (Signed)
LOV 06/12/16 with [facility]  Several cancelled appts due to hospitalizations.  [facility] [place], [place]  [address]

## 2017-04-26 NOTE — Telephone Encounter (Signed)
Routing to provider  

## 2017-04-28 NOTE — Assessment & Plan Note (Signed)
Significantly improved since d/c. Will add additional course of prednisone as well as some tessalon for her cough. Complete remaining abx and home inhaler regimen. F/u if worsening or no improvement

## 2017-04-28 NOTE — Patient Instructions (Addendum)
Follow up in 5 months

## 2017-05-04 ENCOUNTER — Other Ambulatory Visit: Payer: Self-pay

## 2017-05-04 ENCOUNTER — Encounter: Payer: Self-pay | Admitting: Emergency Medicine

## 2017-05-04 ENCOUNTER — Ambulatory Visit: Payer: Self-pay | Admitting: *Deleted

## 2017-05-04 ENCOUNTER — Emergency Department: Payer: Medicare HMO

## 2017-05-04 ENCOUNTER — Emergency Department
Admission: EM | Admit: 2017-05-04 | Discharge: 2017-05-04 | Disposition: A | Discharge status: Home / Self Care | Payer: Medicare HMO | Attending: Emergency Medicine | Admitting: Emergency Medicine

## 2017-05-04 DIAGNOSIS — Z96651 Presence of right artificial knee joint: Secondary | ICD-10-CM | POA: Diagnosis not present

## 2017-05-04 DIAGNOSIS — R197 Diarrhea, unspecified: Secondary | ICD-10-CM

## 2017-05-04 DIAGNOSIS — R000 Tachycardia, unspecified: Secondary | ICD-10-CM | POA: Diagnosis not present

## 2017-05-04 DIAGNOSIS — I2510 Atherosclerotic heart disease of native coronary artery without angina pectoris: Secondary | ICD-10-CM | POA: Insufficient documentation

## 2017-05-04 DIAGNOSIS — I129 Hypertensive chronic kidney disease with stage 1 through stage 4 chronic kidney disease, or unspecified chronic kidney disease: Secondary | ICD-10-CM | POA: Diagnosis not present

## 2017-05-04 DIAGNOSIS — R109 Unspecified abdominal pain: Secondary | ICD-10-CM

## 2017-05-04 DIAGNOSIS — Z7982 Long term (current) use of aspirin: Secondary | ICD-10-CM | POA: Insufficient documentation

## 2017-05-04 DIAGNOSIS — Z79899 Other long term (current) drug therapy: Secondary | ICD-10-CM | POA: Insufficient documentation

## 2017-05-04 DIAGNOSIS — R1031 Right lower quadrant pain: Secondary | ICD-10-CM | POA: Diagnosis not present

## 2017-05-04 DIAGNOSIS — N183 Chronic kidney disease, stage 3 (moderate): Secondary | ICD-10-CM | POA: Insufficient documentation

## 2017-05-04 DIAGNOSIS — F17210 Nicotine dependence, cigarettes, uncomplicated: Secondary | ICD-10-CM | POA: Diagnosis not present

## 2017-05-04 DIAGNOSIS — J449 Chronic obstructive pulmonary disease, unspecified: Secondary | ICD-10-CM | POA: Insufficient documentation

## 2017-05-04 LAB — COMPREHENSIVE METABOLIC PANEL
ALT: 20 U/L (ref 14–54)
AST: 22 U/L (ref 15–41)
Albumin: 3.6 g/dL (ref 3.5–5.0)
Alkaline Phosphatase: 86 U/L (ref 38–126)
Anion gap: 10 (ref 5–15)
BILIRUBIN TOTAL: 0.6 mg/dL (ref 0.3–1.2)
BUN: 36 mg/dL — AB (ref 6–20)
CHLORIDE: 110 mmol/L (ref 101–111)
CO2: 22 mmol/L (ref 22–32)
CREATININE: 2.22 mg/dL — AB (ref 0.44–1.00)
Calcium: 8.4 mg/dL — ABNORMAL LOW (ref 8.9–10.3)
GFR calc Af Amer: 24 mL/min — ABNORMAL LOW (ref >60)
GFR, EST NON AFRICAN AMERICAN: 20 mL/min — AB (ref >60)
Glucose, Bld: 125 mg/dL — ABNORMAL HIGH (ref 65–99)
Potassium: 4.4 mmol/L (ref 3.5–5.1)
Sodium: 142 mmol/L (ref 135–145)
Total Protein: 7.4 g/dL (ref 6.5–8.1)

## 2017-05-04 LAB — CBC
HCT: 48.7 % — ABNORMAL HIGH (ref 35.0–47.0)
Hemoglobin: 15.6 g/dL (ref 12.0–16.0)
MCH: 30.6 pg (ref 26.0–34.0)
MCHC: 32.1 g/dL (ref 32.0–36.0)
MCV: 95.3 fL (ref 80.0–100.0)
PLATELETS: 230 10*3/uL (ref 150–440)
RBC: 5.11 MIL/uL (ref 3.80–5.20)
RDW: 16.3 % — ABNORMAL HIGH (ref 11.5–14.5)
WBC: 6.1 10*3/uL (ref 3.6–11.0)

## 2017-05-04 LAB — TYPE AND SCREEN
ABO/RH(D): O POS
Antibody Screen: NEGATIVE

## 2017-05-04 LAB — LIPASE, BLOOD: Lipase: 39 U/L (ref 11–51)

## 2017-05-04 MED ORDER — IOPAMIDOL (ISOVUE-300) INJECTION 61%
15.0000 mL | INTRAVENOUS | Status: AC
  Administered 2017-05-04: 15 mL via ORAL

## 2017-05-04 MED ORDER — MORPHINE SULFATE (PF) 4 MG/ML IV SOLN
4.0000 mg | Freq: Once | INTRAVENOUS | Status: AC
  Administered 2017-05-04: 4 mg via INTRAVENOUS
  Filled 2017-05-04: qty 1

## 2017-05-04 MED ORDER — ONDANSETRON HCL 4 MG/2ML IJ SOLN
4.0000 mg | Freq: Once | INTRAMUSCULAR | Status: AC
  Administered 2017-05-04: 4 mg via INTRAVENOUS
  Filled 2017-05-04: qty 2

## 2017-05-04 MED ORDER — SODIUM CHLORIDE 0.9 % IV BOLUS (SEPSIS)
1000.0000 mL | Freq: Once | INTRAVENOUS | Status: AC
  Administered 2017-05-04: 1000 mL via INTRAVENOUS

## 2017-05-04 NOTE — ED Triage Notes (Signed)
Pt arrived via POV from home with reports of RUQ abdominal pain and bloody stools, pt states everytime she goes to the bathroom, she has a diarrhea and blood in stool. Pt states she has had pain since Tuesday. Pt tearful in triage.  Pt also states she has been increasingly weak.

## 2017-05-04 NOTE — Telephone Encounter (Signed)
Pt  Has   Symptoms  Of  Abdominal  Pain  r  Side  With  Weakness   Decreased  Fluid  Intake  And  decreased  Appetite. Pt  Also  Noticed  Some  Bright  Red  Rectal  Bleeding  Several  Days  Ago  As  Well. Pt  Advised  To take  Patient to nearest  Hospital  Er. Tiffany at  [facility] notified    Reason for Disposition . [1] SEVERE pain (e.g., excruciating) AND [2] present > 1 hour  Answer Assessment - Initial Assessment Questions 1. LOCATION: "Where does it hurt?"        r  Sided  abd  Pain    2. RADIATION: "Does the pain shoot anywhere else?" (e.g., chest, back)        No 3. ONSET: "When did the pain begin?" (e.g., minutes, hours or days ago)        2 and  1/2  Days   4. SUDDEN: "Gradual or sudden onset?"        Suddenly    5. PATTERN "Does the pain come and go, or is it constant?"    - If constant: "Is it getting better, staying the same, or worsening?"      (Note: Constant means the pain never goes away completely; most serious pain is constant and it progresses)     - If intermittent: "How long does it last?" "Do you have pain now?"     (Note: Intermittent means the pain goes away completely between bouts)       Intermittant   6. SEVERITY: "How bad is the pain?"  (e.g., Scale 1-10; mild, moderate, or severe)   - MILD (1-3): doesn't interfere with normal activities, abdomen soft and not tender to touch    - MODERATE (4-7): interferes with normal activities or awakens from sleep, tender to touch    - SEVERE (8-10): excruciating pain, doubled over, unable to do any normal activities      9 7. RECURRENT SYMPTOM: "Have you ever had this type of abdominal pain before?" If so, ask: "When was the last time?" and "What happened that time?"       No   8. CAUSE: "What do you think is causing the abdominal pain?"      No 9. RELIEVING/AGGRAVATING FACTORS: "What makes it better or worse?" (e.g., movement, antacids, bowel movement)      Pain is  Just  There    10. OTHER SYMPTOMS: "Has there been any  vomiting, diarrhea, constipation, or urine problems?"        Diarrhea     Noticed  2  Days   Bright  Red    11. PREGNANCY: "Is there any chance you are pregnant?" "When was your last menstrual period?"       N/A  Protocols used: ABDOMINAL PAIN - [facility]

## 2017-05-04 NOTE — ED Provider Notes (Addendum)
[facility] Emergency Department Provider Note   ____________________________________________   First MD Initiated Contact with Patient 05/04/17 1517     (approximate)  I have reviewed the triage vital signs and the nursing notes.   HISTORY  Chief Complaint Abdominal Pain and Rectal Bleeding    HPI Katherine [name] is a 77 y.o. female Who reports abdominal pain and diarrhea. She apparently told the nurse was right upper quadrant Examiner's more right lower quadrant. She is having bloody diarrhea as well she says. It started yesterday or last night. Pain is moderate and seems to be sharp and stabbing. Worse with palpation and not really worse with movement.  Past Medical History:  Diagnosis Date  . Angioedema   . Anxiety   . Anxiety and depression   . Chronic constipation   . Chronic kidney disease    stage 3  . COPD (chronic obstructive pulmonary disease) ([place])   . Gallstones   . GERD (gastroesophageal reflux disease)   . Hypertension   . Left ventricular hypertrophy   . Osteoarthritis   . Prurigo nodularis   . Tobacco abuse   . Vitamin D deficiency disease     Patient Active Problem List   Diagnosis Date Noted  . COPD exacerbation ([place]) 04/20/2017  . COPD with exacerbation ([place]) 04/05/2017  . Hand pain 03/21/2017  . Dry skin 03/21/2017  . Pain in finger of left hand 09/12/2016  . Chronic fatigue 06/12/2016  . Left knee pain 06/12/2016  . Advanced care planning/counseling discussion 03/13/2016  . DNR (do not resuscitate) 03/13/2016  . Primary localized osteoarthritis of right knee 02/08/2016  . OSA and COPD overlap syndrome ([place]) 09/16/2015  . Rotator cuff syndrome 09/07/2015  . 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 Sleep disturbance 04/14/2015  . Coronary artery disease 03/14/2015  . 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 Anxiety   . Tobacco abuse   . Prurigo nodularis   . Hypertensive heart disease     . GERD (gastroesophageal reflux disease)   . COPD (chronic obstructive pulmonary disease) ([place])   . Osteoarthritis   . Vitamin D deficiency disease   . Chronic constipation   . CKD (chronic kidney disease), stage III ([place])   . LVH (left ventricular hypertrophy) 05/20/2013  . Essential hypertension 05/20/2013  . Morbid obesity ([place]) 05/20/2013    Past Surgical History:  Procedure Laterality Date  . CHOLECYSTECTOMY    . 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   Procedure: TOTAL KNEE ARTHROPLASTY;  Surgeon: Hessie Knows, MD;  Location: [facility] ORS;  Service: Orthopedics;  Laterality: Right;    Prior to Admission medications   Medication Sig Start Date End Date Taking? Authorizing Provider  acetaminophen (TYLENOL) 650 MG CR tablet Take 650-1,300 mg by mouth every 8 (eight) hours as needed for pain.     [provider]  albuterol (PROVENTIL HFA) 108 (90 Base) MCG/ACT inhaler Inhale 2 puffs into the lungs every 4 (four) hours as needed for wheezing or shortness of breath. 04/25/17   Volney American, PA-C  aspirin EC 81 MG tablet Take 81 mg by mouth daily.    [provider]  azithromycin (ZITHROMAX) 250 MG tablet Take daily as directed 04/22/17   [name], MD  benzonatate (TESSALON) 200 MG capsule Take 1 capsule (200 mg total) by mouth 2 (two) times daily as needed. 04/25/17   Volney American, PA-C  cetirizine (  ZYRTEC) 10 MG tablet TAKE 1/2 TABLET(5 MG) BY MOUTH AT BEDTIME 04/25/17   Volney American, PA-C  cholecalciferol (VITAMIN D) 1000 units tablet Take 1,000 Units by mouth daily.    [provider]  FLUoxetine (PROZAC) 10 MG capsule TAKE 1 CAPSULE(10 MG) BY MOUTH DAILY 01/30/17   [name], NP  fluticasone furoate-vilanterol (BREO ELLIPTA) 100-25 MCG/INH AEPB Inhale 1 puff into the lungs daily. 04/27/16   [name], MD  guaiFENesin (MUCINEX) 600 MG 12 hr tablet Take 1 tablet (600 mg total) by  mouth 2 (two) times daily. 04/08/17   Gladstone Lighter, MD  hydrALAZINE (APRESOLINE) 25 MG tablet TAKE 1 TABLET(25 MG) BY MOUTH TWICE DAILY 04/26/17   Volney American, PA-C  ipratropium-albuterol (DUONEB) 0.5-2.5 (3) MG/3ML SOLN Take 3 mLs by nebulization every 6 (six) hours as needed. 11/29/15   [name], MD  meclizine (ANTIVERT) 25 MG tablet TAKE 1 TABLET(25 MG) BY MOUTH THREE TIMES DAILY AS NEEDED FOR DIZZINESS 10/27/16   Guadalupe Maple, MD  omeprazole (PRILOSEC) 40 MG capsule Take 40 mg by mouth daily.    [provider]  predniSONE (DELTASONE) 10 MG tablet Take 50 mg daily taper by 10 mg daily and then stop 04/23/17   [name], MD  predniSONE (DELTASONE) 10 MG tablet Take 6 tabs day one, 5 tabs day two, 4 tabs day three, 3 tabs day four, 2 tabs day five, 1 tab day 6 04/25/17   Volney American, PA-C  SPIRIVA HANDIHALER 18 MCG inhalation capsule PLACE 1 CAPSULE INTO INHALER AND INHALE EVERY DAY 04/25/17   Volney American, PA-C    Allergies Bee venom; Enalapril maleate; and Other  Family History  Problem Relation Age of Onset  . Alcohol abuse Mother   . Sickle cell anemia Daughter   . Hypertension Son   . Cancer Neg Hx   . COPD Neg Hx   . Diabetes Neg Hx   . Heart disease Neg Hx   . Stroke Neg Hx     Social History Social History   Tobacco Use  . Smoking status: Current Every Day Smoker    Packs/day: 0.50    Years: 60.00    Pack years: 30.00    Types: Cigarettes  . Smokeless tobacco: Never Used  Substance Use Topics  . Alcohol use: No    Alcohol/week: 0.0 oz    Comment: rare  . Drug use: No    Review of Systems  Constitutional: No fever/chills Eyes: No visual changes. ENT: No sore throat. Cardiovascular: Denies chest pain. Respiratory: Denies shortness of breath. Gastrointestinal:the history of present illness Genitourinary: Negative for dysuria. Musculoskeletal: Negative for back pain. Skin: Negative for rash. Neurological:  Negative for headaches, focal weakness   ____________________________________________   PHYSICAL EXAM:  VITAL SIGNS: ED Triage Vitals  Enc Vitals Group     BP 05/04/17 1145 (!) 152/97     Pulse Rate 05/04/17 1145 (!) 103     Resp 05/04/17 1145 (!) 24     Temp 05/04/17 1145 97.7 F (36.5 C)     Temp Source 05/04/17 1145 Oral     SpO2 05/04/17 1145 97 %     Weight 05/04/17 1146 250 lb (113.4 kg)     Height 05/04/17 1146 5\' 7"  (1.702 m)     Head Circumference --      Peak Flow --      Pain Score 05/04/17 1145 9     Pain Loc --  Pain Edu? --      Excl. in [place]? --     Constitutional: Alert and oriented. Well appearing and in no acute distress. Eyes: Conjunctivae are normal. . Head: Atraumatic. Nose: No congestion/rhinnorhea. Mouth/Throat: Mucous membranes are moist.  Oropharynx non-erythematous. Neck: No stridor. Cardiovascular: Normal rate, regular rhythm. Grossly normal heart sounds.  Good peripheral circulation. Respiratory: Normal respiratory effort.  No retractions. Lungs CTAB. Gastrointestinal: Soft somewhat tender under the ribs. The worst tenderness is just to the right is suprapubic area. It is tender there to palpation and percussion. No distention. No abdominal bruits. No CVA tenderness. Musculoskeletal: No lower extremity tenderness nor edema.  No joint effusions. Neurologic:  Normal speech and language. No gross focal neurologic deficits are appreciated. No gait instability. Skin:  Skin is warm, dry and intact. No rash noted. Psychiatric: Mood and affect are normal. Speech and behavior are normal. rectal: Some hemorrhoidal tags are present the stool is liquidy light brown but Hemoccult positive ____________________________________________   LABS (all labs ordered are listed, but only abnormal results are displayed)  Labs Reviewed  COMPREHENSIVE METABOLIC PANEL - Abnormal; Notable for the following components:      Result Value   Glucose, Bld 125 (*)    BUN  36 (*)    Creatinine, Ser 2.22 (*)    Calcium 8.4 (*)    GFR calc non Af Amer 20 (*)    GFR calc Af Amer 24 (*)    All other components within normal limits  CBC - Abnormal; Notable for the following components:   HCT 48.7 (*)    RDW 16.3 (*)    All other components within normal limits  C DIFFICILE QUICK SCREEN W PCR REFLEX  GASTROINTESTINAL PANEL BY PCR, STOOL (REPLACES STOOL CULTURE)  LIPASE, BLOOD  URINALYSIS, COMPLETE (UACMP) WITH MICROSCOPIC  TYPE AND SCREEN   ____________________________________________  EKG  EKG read and interpreted by me on 05/22/2017 shows sinus tachycardia at 108 left axis biatrial enlargement no obvious acute changes ____________________________________________  RADIOLOGY  ED MD interpretation:  Official radiology report(s): Ct Abdomen Pelvis Wo Contrast  Result Date: 05/04/2017 CLINICAL DATA:  Right upper quadrant pain and bloody stools. Diarrhea. EXAM: CT ABDOMEN AND PELVIS WITHOUT CONTRAST TECHNIQUE: Multidetector CT imaging of the abdomen and pelvis was performed following the standard protocol without IV contrast. COMPARISON:  04/29/2004 and chest CT 04/12/2016 FINDINGS: Lower chest: Minimal bibasilar linear atelectasis. Calcified plaque over the right coronary artery. Hepatobiliary: Previous cholecystectomy. The liver and biliary tree are normal. Pancreas: Normal. Spleen: Normal. Adrenals/Urinary Tract: Right adrenal gland is normal. There is an oval left adrenal mass with minimal calcification measuring 1.9 cm and Hounsfield unit measurements of less than 10 compatible with an adenoma. Right kidney is normal in size with a punctate stone over the lower pole collecting system. Left kidney is slightly small without hydronephrosis or nephrolithiasis. Subcentimeter hypodensities over the upper pole and lower pole left kidney too small to characterize but likely cysts. Ureters and bladder are normal. Stomach/Bowel: Stomach is normal. Diverticula over the  third portion of the duodenum. Remainder of the small bowel is normal. Appendix is normal. Colon is normal. Vascular/Lymphatic: Mild calcified plaque over the abdominal aorta. There is mild aneurysmal dilatation of the infrarenal abdominal aorta which measures 4.0 x 4.3 cm in AP and transverse dimension. No significant adenopathy. Reproductive: Uterus and ovaries are normal. Other: No free fluid or focal inflammatory change. Musculoskeletal: Degenerative change of the spine and hips. IMPRESSION: No acute findings in  the abdomen/pelvis. 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 Recommend followup by ultrasound in 1 year. This recommendation follows ACR consensus guidelines: White Paper of the ACR Incidental Findings Committee II on Vascular Findings. J Am Coll Radiol 2013; 10:789-794. Aortic Atherosclerosis (ICD10-I70.0). Somewhat small left kidney with 2 subcentimeter hypodensities too small to characterize but likely cysts. Nonobstructing punctate stone over the lower pole right kidney. 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 Electronically Signed   By: Marin Olp M.D.   On: 05/04/2017 17:47    ____________________________________________   PROCEDURES  Procedure(s) performed:   Procedures  Critical Care performed:   ____________________________________________   INITIAL IMPRESSION / ASSESSMENT AND PLAN / ED COURSE  ----------------------------------------- 6:54 PM on 05/04/2017 -----------------------------------------  At this time patient's abdominal pain is better. She has not had any diarrheal stool for me to send to lab. She is not running a fever and her CT is negative. I will let her go home. She will come back if she feels worse. She will follow-up with gastroenterology and her family doctor.     Clinical Course as of May 04 1853  Fri May 04, 2017  1516 AST: 22 [PM]    Clinical Course User Index [PM] Nena Polio, MD      ____________________________________________   FINAL CLINICAL IMPRESSION(S) / ED DIAGNOSES  Final diagnoses:  Abdominal pain, unspecified abdominal location  Diarrhea, unspecified type     ED Discharge Orders    None       Note:  This document was prepared using Dragon voice recognition software and may include unintentional dictation errors.    Nena Polio, MD 05/04/17 Randol Kern    Nena Polio, MD 05/22/17 1128

## 2017-05-04 NOTE — Discharge Instructions (Signed)
please return for worse pain fever or vomiting or if you feel weak or lightheaded or sicker. Please follow-up with your regular family doctor. also please follow-up with the gastroenterologist,Dr Tahiliani. This is important because there was a trace amount of blood in your stool today.

## 2017-05-04 NOTE — ED Notes (Signed)
Pt verbalizes understanding of discharge instructions.

## 2017-05-08 ENCOUNTER — Other Ambulatory Visit: Payer: Self-pay | Admitting: Family Medicine

## 2017-05-12 DIAGNOSIS — J449 Chronic obstructive pulmonary disease, unspecified: Secondary | ICD-10-CM | POA: Diagnosis not present

## 2017-05-16 ENCOUNTER — Emergency Department: Payer: Medicare HMO

## 2017-05-16 ENCOUNTER — Encounter: Payer: Self-pay | Admitting: Emergency Medicine

## 2017-05-16 ENCOUNTER — Other Ambulatory Visit: Payer: Self-pay

## 2017-05-16 ENCOUNTER — Emergency Department
Admit: 2017-05-16 | Discharge: 2017-05-16 | Disposition: A | Discharge status: Home / Self Care | Payer: Medicare HMO | Attending: Internal Medicine | Admitting: Internal Medicine

## 2017-05-16 ENCOUNTER — Inpatient Hospital Stay
Admission: EM | Admit: 2017-05-16 | Discharge: 2017-05-21 | DRG: 190 | Disposition: A | Discharge status: Home Health | Payer: Medicare HMO | Attending: Internal Medicine | Admitting: Internal Medicine

## 2017-05-16 DIAGNOSIS — Z9981 Dependence on supplemental oxygen: Secondary | ICD-10-CM

## 2017-05-16 DIAGNOSIS — R0600 Dyspnea, unspecified: Secondary | ICD-10-CM

## 2017-05-16 DIAGNOSIS — N183 Chronic kidney disease, stage 3 (moderate): Secondary | ICD-10-CM | POA: Diagnosis present

## 2017-05-16 DIAGNOSIS — N179 Acute kidney failure, unspecified: Secondary | ICD-10-CM | POA: Diagnosis present

## 2017-05-16 DIAGNOSIS — Z7189 Other specified counseling: Secondary | ICD-10-CM | POA: Diagnosis not present

## 2017-05-16 DIAGNOSIS — I129 Hypertensive chronic kidney disease with stage 1 through stage 4 chronic kidney disease, or unspecified chronic kidney disease: Secondary | ICD-10-CM | POA: Diagnosis present

## 2017-05-16 DIAGNOSIS — I2510 Atherosclerotic heart disease of native coronary artery without angina pectoris: Secondary | ICD-10-CM | POA: Diagnosis present

## 2017-05-16 DIAGNOSIS — R9431 Abnormal electrocardiogram [ECG] [EKG]: Secondary | ICD-10-CM | POA: Diagnosis not present

## 2017-05-16 DIAGNOSIS — G4733 Obstructive sleep apnea (adult) (pediatric): Secondary | ICD-10-CM | POA: Diagnosis present

## 2017-05-16 DIAGNOSIS — J9620 Acute and chronic respiratory failure, unspecified whether with hypoxia or hypercapnia: Secondary | ICD-10-CM | POA: Diagnosis not present

## 2017-05-16 DIAGNOSIS — Z96651 Presence of right artificial knee joint: Secondary | ICD-10-CM | POA: Diagnosis present

## 2017-05-16 DIAGNOSIS — Z66 Do not resuscitate: Secondary | ICD-10-CM | POA: Diagnosis present

## 2017-05-16 DIAGNOSIS — Z79899 Other long term (current) drug therapy: Secondary | ICD-10-CM | POA: Diagnosis not present

## 2017-05-16 DIAGNOSIS — I160 Hypertensive urgency: Secondary | ICD-10-CM | POA: Diagnosis not present

## 2017-05-16 DIAGNOSIS — J439 Emphysema, unspecified: Principal | ICD-10-CM | POA: Diagnosis present

## 2017-05-16 DIAGNOSIS — Z515 Encounter for palliative care: Secondary | ICD-10-CM | POA: Diagnosis not present

## 2017-05-16 DIAGNOSIS — Z7982 Long term (current) use of aspirin: Secondary | ICD-10-CM

## 2017-05-16 DIAGNOSIS — J441 Chronic obstructive pulmonary disease with (acute) exacerbation: Secondary | ICD-10-CM | POA: Diagnosis not present

## 2017-05-16 DIAGNOSIS — Z6839 Body mass index (BMI) 39.0-39.9, adult: Secondary | ICD-10-CM

## 2017-05-16 DIAGNOSIS — K219 Gastro-esophageal reflux disease without esophagitis: Secondary | ICD-10-CM | POA: Diagnosis present

## 2017-05-16 DIAGNOSIS — R531 Weakness: Secondary | ICD-10-CM | POA: Diagnosis not present

## 2017-05-16 DIAGNOSIS — R0602 Shortness of breath: Secondary | ICD-10-CM | POA: Diagnosis not present

## 2017-05-16 DIAGNOSIS — F17210 Nicotine dependence, cigarettes, uncomplicated: Secondary | ICD-10-CM | POA: Diagnosis present

## 2017-05-16 DIAGNOSIS — J9600 Acute respiratory failure, unspecified whether with hypoxia or hypercapnia: Secondary | ICD-10-CM | POA: Diagnosis not present

## 2017-05-16 LAB — BASIC METABOLIC PANEL
ANION GAP: 8 (ref 5–15)
BUN: 24 mg/dL — ABNORMAL HIGH (ref 6–20)
CALCIUM: 9.1 mg/dL (ref 8.9–10.3)
CO2: 25 mmol/L (ref 22–32)
Chloride: 110 mmol/L (ref 101–111)
Creatinine, Ser: 2 mg/dL — ABNORMAL HIGH (ref 0.44–1.00)
GFR, EST AFRICAN AMERICAN: 27 mL/min — AB (ref >60)
GFR, EST NON AFRICAN AMERICAN: 23 mL/min — AB (ref >60)
Glucose, Bld: 134 mg/dL — ABNORMAL HIGH (ref 65–99)
POTASSIUM: 4.5 mmol/L (ref 3.5–5.1)
SODIUM: 143 mmol/L (ref 135–145)

## 2017-05-16 LAB — CBC WITH DIFFERENTIAL/PLATELET
BASOS ABS: 0 10*3/uL (ref 0–0.1)
BASOS PCT: 1 %
EOS PCT: 2 %
Eosinophils Absolute: 0.1 10*3/uL (ref 0–0.7)
HCT: 39.7 % (ref 35.0–47.0)
Hemoglobin: 12.9 g/dL (ref 12.0–16.0)
LYMPHS PCT: 23 %
Lymphs Abs: 1.3 10*3/uL (ref 1.0–3.6)
MCH: 31 pg (ref 26.0–34.0)
MCHC: 32.5 g/dL (ref 32.0–36.0)
MCV: 95.4 fL (ref 80.0–100.0)
Monocytes Absolute: 0.5 10*3/uL (ref 0.2–0.9)
Monocytes Relative: 9 %
Neutro Abs: 3.5 10*3/uL (ref 1.4–6.5)
Neutrophils Relative %: 65 %
PLATELETS: 243 10*3/uL (ref 150–440)
RBC: 4.16 MIL/uL (ref 3.80–5.20)
RDW: 15.6 % — AB (ref 11.5–14.5)
WBC: 5.4 10*3/uL (ref 3.6–11.0)

## 2017-05-16 LAB — BRAIN NATRIURETIC PEPTIDE: B Natriuretic Peptide: 72 pg/mL (ref 0.0–100.0)

## 2017-05-16 LAB — ECHOCARDIOGRAM COMPLETE
Height: 67 in
Weight: 4000 oz

## 2017-05-16 LAB — TROPONIN I

## 2017-05-16 MED ORDER — ALBUTEROL SULFATE (2.5 MG/3ML) 0.083% IN NEBU
2.5000 mg | INHALATION_SOLUTION | RESPIRATORY_TRACT | Status: DC | PRN
  Administered 2017-05-16: 2.5 mg via RESPIRATORY_TRACT
  Filled 2017-05-16: qty 3

## 2017-05-16 MED ORDER — TIOTROPIUM BROMIDE MONOHYDRATE 18 MCG IN CAPS
18.0000 ug | ORAL_CAPSULE | Freq: Every day | RESPIRATORY_TRACT | Status: DC
  Administered 2017-05-17 – 2017-05-21 (×5): 18 ug via RESPIRATORY_TRACT
  Filled 2017-05-16 (×2): qty 5

## 2017-05-16 MED ORDER — IPRATROPIUM-ALBUTEROL 0.5-2.5 (3) MG/3ML IN SOLN
3.0000 mL | Freq: Once | RESPIRATORY_TRACT | Status: AC
  Administered 2017-05-16: 3 mL via RESPIRATORY_TRACT
  Filled 2017-05-16: qty 3

## 2017-05-16 MED ORDER — LABETALOL HCL 5 MG/ML IV SOLN
10.0000 mg | Freq: Once | INTRAVENOUS | Status: AC
  Administered 2017-05-16: 10 mg via INTRAVENOUS
  Filled 2017-05-16: qty 4

## 2017-05-16 MED ORDER — METHYLPREDNISOLONE SODIUM SUCC 40 MG IJ SOLR
40.0000 mg | Freq: Three times a day (TID) | INTRAMUSCULAR | Status: DC
  Administered 2017-05-16 – 2017-05-18 (×6): 40 mg via INTRAVENOUS
  Filled 2017-05-16 (×6): qty 1

## 2017-05-16 MED ORDER — LORATADINE 10 MG PO TABS
10.0000 mg | ORAL_TABLET | Freq: Every day | ORAL | Status: DC
  Administered 2017-05-17 – 2017-05-21 (×5): 10 mg via ORAL
  Filled 2017-05-16 (×6): qty 1

## 2017-05-16 MED ORDER — HYDRALAZINE HCL 25 MG PO TABS
25.0000 mg | ORAL_TABLET | Freq: Two times a day (BID) | ORAL | Status: DC
  Administered 2017-05-16 – 2017-05-21 (×10): 25 mg via ORAL
  Filled 2017-05-16 (×10): qty 1

## 2017-05-16 MED ORDER — PANTOPRAZOLE SODIUM 40 MG PO TBEC
40.0000 mg | DELAYED_RELEASE_TABLET | Freq: Every day | ORAL | Status: DC
  Administered 2017-05-16 – 2017-05-21 (×6): 40 mg via ORAL
  Filled 2017-05-16 (×6): qty 1

## 2017-05-16 MED ORDER — METHYLPREDNISOLONE SODIUM SUCC 125 MG IJ SOLR
125.0000 mg | Freq: Once | INTRAMUSCULAR | Status: AC
  Administered 2017-05-16: 125 mg via INTRAVENOUS
  Filled 2017-05-16: qty 2

## 2017-05-16 MED ORDER — FLUTICASONE FUROATE-VILANTEROL 100-25 MCG/INH IN AEPB
1.0000 | INHALATION_SPRAY | Freq: Every day | RESPIRATORY_TRACT | Status: DC
  Filled 2017-05-16: qty 28

## 2017-05-16 MED ORDER — FLUOXETINE HCL 10 MG PO CAPS
10.0000 mg | ORAL_CAPSULE | Freq: Every day | ORAL | Status: DC
  Administered 2017-05-17 – 2017-05-21 (×5): 10 mg via ORAL
  Filled 2017-05-16 (×6): qty 1

## 2017-05-16 MED ORDER — VITAMIN D 1000 UNITS PO TABS
1000.0000 [IU] | ORAL_TABLET | Freq: Every day | ORAL | Status: DC
  Administered 2017-05-17 – 2017-05-21 (×5): 1000 [IU] via ORAL
  Filled 2017-05-16 (×5): qty 1

## 2017-05-16 MED ORDER — IPRATROPIUM-ALBUTEROL 0.5-2.5 (3) MG/3ML IN SOLN: 3.0000 mL | RESPIRATORY_TRACT | Status: DC | PRN

## 2017-05-16 MED ORDER — ASPIRIN EC 81 MG PO TBEC
81.0000 mg | DELAYED_RELEASE_TABLET | Freq: Every day | ORAL | Status: DC
  Administered 2017-05-16 – 2017-05-21 (×6): 81 mg via ORAL
  Filled 2017-05-16 (×6): qty 1

## 2017-05-16 NOTE — ED Triage Notes (Signed)
C/O SOB.  Onset of symptoms last night.  Patient wears home oxygen 2l/ [place] prn at home.  Patient has history of COPD

## 2017-05-16 NOTE — Progress Notes (Signed)
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

## 2017-05-16 NOTE — H&P (Signed)
[facility] [place] NAME: Katherine [name]    MR#:  [id number]  DATE OF BIRTH:  1940/06/27  DATE OF ADMISSION:  05/16/2017  PRIMARY CARE PHYSICIAN: [name], NP   REQUESTING/REFERRING PHYSICIAN: dr Jimmye Norman  CHIEF COMPLAINT:   Increasing shortness of breath and reason for two days HISTORY OF PRESENT ILLNESS:  Katherine [name]  is a 77 y.o. 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 Patient received IV Solu-Medrol IV nebulizer treatment being admitted for further voucher management. Chest x-ray negative for pneumonia.  PAST MEDICAL HISTORY:   Past Medical History:  Diagnosis Date  . Angioedema   . Anxiety   . Anxiety and depression   . Chronic constipation   . Chronic kidney disease    stage 3  . COPD (chronic obstructive pulmonary disease) ([place])   . Gallstones   . GERD (gastroesophageal reflux disease)   . Hypertension   . Left ventricular hypertrophy   . Osteoarthritis   . Prurigo nodularis   . Tobacco abuse   . Vitamin D deficiency disease     PAST SURGICAL HISTOIRY:   Past Surgical History:  Procedure Laterality Date  . CHOLECYSTECTOMY    . 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   Procedure: TOTAL KNEE ARTHROPLASTY;  Surgeon: Hessie Knows, MD;  Location: [facility] ORS;  Service: Orthopedics;  Laterality: Right;    SOCIAL HISTORY:   Social History   Tobacco Use  . Smoking status: Current Every Day Smoker    Packs/day: 0.50    Years: 60.00    Pack years: 30.00    Types: Cigarettes  . Smokeless tobacco: Never Used  Substance Use Topics  . Alcohol use: No    Alcohol/week: 0.0 oz    Comment: rare    FAMILY HISTORY:    Family History  Problem Relation Age of Onset  . Alcohol abuse Mother   . Sickle cell anemia Daughter   . Hypertension Son   . Cancer Neg Hx   . COPD Neg Hx   . Diabetes Neg Hx   . Heart disease Neg Hx   . Stroke Neg Hx     DRUG ALLERGIES:   Allergies  Allergen Reactions  . Bee Venom Swelling  . Enalapril Maleate Swelling  . Other     Bee stings    REVIEW OF SYSTEMS:  Review of Systems  Constitutional: Negative for chills, fever and weight loss.  HENT: Negative for ear discharge, ear pain and nosebleeds.   Eyes: Negative for blurred vision, pain and discharge.  Respiratory: Positive for shortness of breath and wheezing. Negative for sputum production and stridor.   Cardiovascular: Positive for chest pain. Negative for palpitations, orthopnea and PND.  Gastrointestinal: Negative for abdominal pain, diarrhea, nausea and vomiting.  Genitourinary: Negative for frequency and urgency.  Musculoskeletal: Negative for back pain and joint pain.  Neurological: Negative for sensory change, speech change, focal weakness and weakness.  Psychiatric/Behavioral: Negative for depression and hallucinations. The patient is not nervous/anxious.      MEDICATIONS AT HOME:   Prior to Admission medications   Medication Sig Start Date End Date Taking? Authorizing Provider  aspirin EC 81 MG tablet Take 81 mg by mouth daily.  Yes [provider]  cholecalciferol (VITAMIN D) 1000 units tablet Take 1,000 Units by mouth daily.   Yes [provider]  FLUoxetine (PROZAC) 10 MG capsule TAKE 1 CAPSULE(10 MG) BY MOUTH DAILY 01/30/17  Yes [name], NP  fluticasone furoate-vilanterol (BREO ELLIPTA) 100-25 MCG/INH AEPB Inhale 1 puff into the lungs daily. 04/27/16  Yes [name], MD  hydrALAZINE (APRESOLINE) 25 MG tablet TAKE 1 TABLET(25 MG) BY MOUTH TWICE DAILY 04/26/17  Yes Volney American, PA-C  ipratropium-albuterol (DUONEB) 0.5-2.5 (3) MG/3ML SOLN Take 3 mLs by  nebulization every 6 (six) hours as needed. 11/29/15  Yes [name], MD  omeprazole (PRILOSEC) 40 MG capsule Take 40 mg by mouth daily.   Yes [provider]  SPIRIVA HANDIHALER 18 MCG inhalation capsule PLACE 1 CAPSULE INTO INHALER AND INHALE EVERY DAY 04/25/17  Yes Volney American, PA-C  acetaminophen (TYLENOL) 650 MG CR tablet Take 650-1,300 mg by mouth every 8 (eight) hours as needed for pain.     [provider]  albuterol (PROVENTIL HFA) 108 (90 Base) MCG/ACT inhaler Inhale 2 puffs into the lungs every 4 (four) hours as needed for wheezing or shortness of breath. 04/25/17   Volney American, PA-C  cetirizine (ZYRTEC) 10 MG tablet TAKE 1/2 TABLET(5 MG) BY MOUTH AT BEDTIME 04/25/17   Volney American, PA-C  meclizine (ANTIVERT) 25 MG tablet TAKE 1 TABLET(25 MG) BY MOUTH THREE TIMES DAILY AS NEEDED FOR DIZZINESS 10/27/16   Guadalupe Maple, MD      VITAL SIGNS:  Blood pressure (!) 159/98, pulse 91, temperature 97.6 F (36.4 C), temperature source Axillary, resp. rate (!) 26, height 5\' 7"  (1.702 m), weight 113.4 kg (250 lb), SpO2 99 %.  PHYSICAL EXAMINATION:  GENERAL:  77 y.o.-year-old patient lying in the bed with no acute distress. obese EYES: Pupils equal, round, reactive to light and accommodation. No scleral icterus. Extraocular muscles intact.  HEENT: Head atraumatic, normocephalic. Oropharynx and nasopharynx clear.  NECK:  Supple, no jugular venous distention. No thyroid enlargement, no tenderness.  LUNGS: Normal breath sounds bilaterally, ++  wheezing,no rales,rhonchi or crepitation. No use of accessory muscles of respiration.  CARDIOVASCULAR: S1, S2 normal. No murmurs, rubs, or gallops.  ABDOMEN: Soft, nontender, nondistended. Bowel sounds present. No organomegaly or mass.  EXTREMITIES: No pedal edema, cyanosis, or clubbing.  NEUROLOGIC: Cranial nerves II through XII are intact. Muscle strength 5/5 in all extremities. Sensation intact. Gait not  checked.  PSYCHIATRIC: The patient is alert and oriented x 3.  SKIN: No obvious rash, lesion, or ulcer.   LABORATORY PANEL:   CBC Recent Labs  Lab 05/16/17 0854  WBC 5.4  HGB 12.9  HCT 39.7  PLT 243   ------------------------------------------------------------------------------------------------------------------  Chemistries  Recent Labs  Lab 05/16/17 0854  NA 143  K 4.5  CL 110  CO2 25  GLUCOSE 134*  BUN 24*  CREATININE 2.00*  CALCIUM 9.1   ------------------------------------------------------------------------------------------------------------------  Cardiac Enzymes Recent Labs  Lab 05/16/17 0854  TROPONINI <0.03   ------------------------------------------------------------------------------------------------------------------  RADIOLOGY:  Dg Chest Port 1 View  Result Date: 05/16/2017 CLINICAL DATA:  Shortness of Breath EXAM: PORTABLE CHEST 1 VIEW COMPARISON:  April 20, 2017 FINDINGS: There is no edema or consolidation. The heart size and pulmonary vascularity are normal. No adenopathy. There is aortic atherosclerosis. No evident bone lesions. IMPRESSION: Aortic atherosclerosis.  No edema or consolidation. Aortic Atherosclerosis (ICD10-I70.0). Electronically Signed   By: Lowella Grip III M.D.   On: 05/16/2017 09:30  EKG:    IMPRESSION AND PLAN:   Katherine [name]  is a 77 y.o. 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  1. acute on chronic pictorial failure due to COPD with exacerbation (HCC) -IV Solu-Medrol and duo nebs--change to po steroid taper discharge -    And with as needed nebs PRN antitussive. -uses chronic home oxygen  2.Essential hypertension -continue home medications  3.Coronary  artery disease -continue home meds  4.OSA and COPD overlap syndrome (HCC) -CPAP nightly  5.GERD (gastroesophageal reflux disease) -home dose PPI  6.CKD (chronic kidney disease), stage III ([place]) -at baseline, avoid nephrotoxins and monitor  No family members in the ER    All the records are reviewed and case discussed with ED provider. Management plans discussed with the patient, family and they are in agreement.  CODE STATUS: dnr  TOTAL TIME TAKING CARE OF THIS PATIENT: *45* minutes.    [name] M.D on 05/16/2017 at 3:24 PM  Between 7am to 6pm - Pager - [phone]  After 6pm go to [email or web address] - password [email or web address] [facility]  [facility]  Office  [phone]  CC: Primary care physician; [name], NP

## 2017-05-16 NOTE — Progress Notes (Signed)
*  PRELIMINARY RESULTS* Echocardiogram 2D Echocardiogram has been performed.  Katherine [name] 05/16/2017, 3:00 PM

## 2017-05-16 NOTE — Care Management Note (Signed)
Case Management Note  Patient Details  Name: Seidy Labreck MRN: [id number] Date of Birth: [birth year]  Subjective/Objective:                  RNCM met with patient to offer [facility] as outpatient case management/medical management for COPD. She has presented to hospital multiple times over last six months.  She depends on her husband for transportation and ADLs.  She has chronic O2 at home through [place]  Action/Plan: .    Referral to [facility].    Expected Discharge Date:                  Expected Discharge Plan:     In-House Referral:     Discharge planning Services  CM Consult  Post Acute Care Choice:    Choice offered to:  Patient  DME Arranged:    DME Agency:     HH Arranged:    [place] Agency:     Status of Service:  In process, will continue to follow  If discussed at Long Length of Stay Meetings, dates discussed:    Additional Comments:  Marshell Garfinkel, RN 05/16/2017, 12:59 PM

## 2017-05-16 NOTE — ED Notes (Signed)
Hospitalist to bedside at this time 

## 2017-05-16 NOTE — ED Provider Notes (Signed)
[facility] Emergency Department Provider Note       Time seen: ----------------------------------------- 9:51 AM on 05/16/2017 -----------------------------------------   I have reviewed the triage vital signs and the nursing notes.  HISTORY   Chief Complaint Shortness of Breath    HPI Katherine [name] is a 77 y.o. female with a history of angioedema, anxiety, constipation, COPD, GERD, hypertension, LVH who presents to the ED for shortness of breath.  Patient wears 2 L of nasal cannula oxygen as needed at home.  She has a history of COPD.  Family states that she has had increased shortness of breath usually indicative of her COPD exacerbations.  She has been seen recently for same.  She denies fevers, chills or other complaints.  Past Medical History:  Diagnosis Date  . Angioedema   . Anxiety   . Anxiety and depression   . Chronic constipation   . Chronic kidney disease    stage 3  . COPD (chronic obstructive pulmonary disease) ([place])   . Gallstones   . GERD (gastroesophageal reflux disease)   . Hypertension   . Left ventricular hypertrophy   . Osteoarthritis   . Prurigo nodularis   . Tobacco abuse   . Vitamin D deficiency disease     Patient Active Problem List   Diagnosis Date Noted  . COPD exacerbation ([place]) 04/20/2017  . COPD with exacerbation ([place]) 04/05/2017  . Hand pain 03/21/2017  . Dry skin 03/21/2017  . Pain in finger of left hand 09/12/2016  . Chronic fatigue 06/12/2016  . Left knee pain 06/12/2016  . Advanced care planning/counseling discussion 03/13/2016  . DNR (do not resuscitate) 03/13/2016  . Primary localized osteoarthritis of right knee 02/08/2016  . OSA and COPD overlap syndrome ([place]) 09/16/2015  . Rotator cuff syndrome 09/07/2015  . 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 Sleep disturbance 04/14/2015  . Coronary artery disease 03/14/2015  . 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 Anxiety    . Tobacco abuse   . Prurigo nodularis   . Hypertensive heart disease   . GERD (gastroesophageal reflux disease)   . COPD (chronic obstructive pulmonary disease) ([place])   . Osteoarthritis   . Vitamin D deficiency disease   . Chronic constipation   . CKD (chronic kidney disease), stage III ([place])   . LVH (left ventricular hypertrophy) 05/20/2013  . Essential hypertension 05/20/2013  . Morbid obesity ([place]) 05/20/2013    Past Surgical History:  Procedure Laterality Date  . CHOLECYSTECTOMY    . 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   Procedure: TOTAL KNEE ARTHROPLASTY;  Surgeon: Hessie Knows, MD;  Location: [facility] ORS;  Service: Orthopedics;  Laterality: Right;    Allergies Bee venom; Enalapril maleate; and Other  Social History Social History   Tobacco Use  . Smoking status: Current Every Day Smoker    Packs/day: 0.50    Years: 60.00    Pack years: 30.00    Types: Cigarettes  . Smokeless tobacco: Never Used  Substance Use Topics  . Alcohol use: No    Alcohol/week: 0.0 oz    Comment: rare  . Drug use: No   Review of Systems Constitutional: Negative for fever. Cardiovascular: Negative for chest pain. Respiratory: Positive for shortness of breath Gastrointestinal: Negative for abdominal pain, vomiting and diarrhea. Genitourinary: Negative for dysuria. Musculoskeletal: Negative for back pain.  Positive for edema Skin: Negative for rash. Neurological: Negative for headaches, focal weakness  or numbness.  All systems negative/normal/unremarkable except as stated in the HPI  ____________________________________________   PHYSICAL EXAM:  VITAL SIGNS: ED Triage Vitals  Enc Vitals Group     BP 05/16/17 0851 (!) 162/99     Pulse Rate 05/16/17 0851 (!) 110     Resp 05/16/17 0851 (!) 28     Temp 05/16/17 0853 97.6 F (36.4 C)     Temp Source 05/16/17 0853 Axillary     SpO2 05/16/17 0851 92 %     Weight 05/16/17 0852 250 lb  (113.4 kg)     Height 05/16/17 0852 5\' 7"  (1.702 m)     Head Circumference --      Peak Flow --      Pain Score 05/16/17 0852 0     Pain Loc --      Pain Edu? --      Excl. in [place]? --    Constitutional: Alert and oriented.  Mild distress Eyes: Conjunctivae are normal. Normal extraocular movements. ENT   Head: Normocephalic and atraumatic.   Nose: No congestion/rhinnorhea.   Mouth/Throat: Mucous membranes are moist.   Neck: No stridor. Cardiovascular: Normal rate, regular rhythm. No murmurs, rubs, or gallops. Respiratory: Tachypnea with splinting and grunting breathing Gastrointestinal: Soft and nontender. Normal bowel sounds Musculoskeletal: Nontender with normal range of motion in extremities. No lower extremity tenderness nor edema. Neurologic:  Normal speech and language. No gross focal neurologic deficits are appreciated.  Skin:  Skin is warm, dry and intact. No rash noted. Psychiatric: Mood and affect are normal. Speech and behavior are normal.  ____________________________________________  EKG: Interpreted by me.  Sinus tachycardia with a rate of 108 bpm, left axis deviation, low voltage, possible septal infarct age-indeterminate  ____________________________________________  ED COURSE:  As part of my medical decision making, I reviewed the following data within the [place] History obtained from family if available, nursing notes, old chart and ekg, as well as notes from prior ED visits. Patient presented for dyspnea, we will assess with labs and imaging as indicated at this time.   Procedures ____________________________________________   LABS (pertinent positives/negatives)  Labs Reviewed  CBC WITH DIFFERENTIAL/PLATELET - Abnormal; Notable for the following components:      Result Value   RDW 15.6 (*)    All other components within normal limits  BASIC METABOLIC PANEL - Abnormal; Notable for the following components:   Glucose, Bld  134 (*)    BUN 24 (*)    Creatinine, Ser 2.00 (*)    GFR calc non Af Amer 23 (*)    GFR calc Af Amer 27 (*)    All other components within normal limits  BLOOD GAS, VENOUS - Abnormal; Notable for the following components:   pO2, Ven 75.0 (*)    All other components within normal limits  TROPONIN I  BRAIN NATRIURETIC PEPTIDE    RADIOLOGY  Chest x-ray IMPRESSION: Aortic atherosclerosis.  No edema or consolidation.  Aortic Atherosclerosis (ICD10-I70.0). ____________________________________________  DIFFERENTIAL DIAGNOSIS   COPD, pneumonia, MI, PE, pneumothorax  FINAL ASSESSMENT AND PLAN  Dyspnea, COPD, hypertensive urgency   Plan: The patient had presented for dyspnea. Patient's labs revealed chronic kidney disease with no acute findings. Patient's imaging was negative for any acute process.  She will need further work-up including blood pressure management, steroids and breathing treatments periodically.  She may benefit from echocardiogram as well.  There is also possibility that she may have a PE but with her elevated creatinine  she would have to have a VQ scan.  I will discuss with the hospitalist for admission, we gave her labetalol for her blood pressure and duo nebs and steroids while in the ER.   Laurence Aly, MD   Note: This note was generated in part or whole with voice recognition software. Voice recognition is usually quite accurate but there are transcription errors that can and very often do occur. I apologize for any typographical errors that were not detected and corrected.     Earleen Newport, MD 05/16/17 1136

## 2017-05-17 DIAGNOSIS — Z7189 Other specified counseling: Secondary | ICD-10-CM

## 2017-05-17 DIAGNOSIS — Z515 Encounter for palliative care: Secondary | ICD-10-CM

## 2017-05-17 DIAGNOSIS — R0600 Dyspnea, unspecified: Secondary | ICD-10-CM

## 2017-05-17 DIAGNOSIS — J441 Chronic obstructive pulmonary disease with (acute) exacerbation: Secondary | ICD-10-CM

## 2017-05-17 LAB — BLOOD GAS, ARTERIAL
ACID-BASE EXCESS: 0.2 mmol/L (ref 0.0–2.0)
BICARBONATE: 26 mmol/L (ref 20.0–28.0)
FIO2: 32
O2 Saturation: 94.1 %
PATIENT TEMPERATURE: 37
pCO2 arterial: 46 mmHg (ref 32.0–48.0)
pH, Arterial: 7.36 (ref 7.350–7.450)
pO2, Arterial: 74 mmHg — ABNORMAL LOW (ref 83.0–108.0)

## 2017-05-17 MED ORDER — HEPARIN SODIUM (PORCINE) 5000 UNIT/ML IJ SOLN
5000.0000 [IU] | Freq: Three times a day (TID) | INTRAMUSCULAR | Status: DC
  Administered 2017-05-17 – 2017-05-20 (×8): 5000 [IU] via SUBCUTANEOUS
  Filled 2017-05-17 (×9): qty 1

## 2017-05-17 NOTE — Progress Notes (Signed)
[facility] [place] NAME: Katherine [name]    MR#:  [id number]  DATE OF BIRTH:  Jun 18, 1940  SUBJECTIVE:  CHIEF COMPLAINT:  SOB is better, but wheezing  REVIEW OF SYSTEMS:  CONSTITUTIONAL: No fever, fatigue or weakness.  EYES: No blurred or double vision.  EARS, NOSE, AND THROAT: No tinnitus or ear pain.  RESPIRATORY: reports cough, shortness of breath, wheezing or hemoptysis.  CARDIOVASCULAR: No chest pain, orthopnea, edema.  GASTROINTESTINAL: No nausea, vomiting, diarrhea or abdominal pain.  GENITOURINARY: No dysuria, hematuria.  ENDOCRINE: No polyuria, nocturia,  HEMATOLOGY: No anemia, easy bruising or bleeding SKIN: No rash or lesion. MUSCULOSKELETAL: No joint pain or arthritis.   NEUROLOGIC: No tingling, numbness, weakness.  PSYCHIATRY: No anxiety or depression.   DRUG ALLERGIES:   Allergies  Allergen Reactions  . Bee Venom Swelling  . Enalapril Maleate Swelling  . Other     Bee stings    VITALS:  Blood pressure 126/75, pulse 84, temperature 98 F (36.7 C), temperature source Oral, resp. rate (!) 28, height 5\' 7"  (1.702 m), weight 113.4 kg (250 lb), SpO2 96 %.  PHYSICAL EXAMINATION:  GENERAL:  77 y.o.-year-old patient lying in the bed with no acute distress.  EYES: Pupils equal, round, reactive to light and accommodation. No scleral icterus. Extraocular muscles intact.  HEENT: Head atraumatic, normocephalic. Oropharynx and nasopharynx clear.  NECK:  Supple, no jugular venous distention. No thyroid enlargement, no tenderness.  LUNGS: Mod breath sounds bilaterally,  wheezing, rales,rhonchi or crepitation. No use of accessory muscles of respiration.  CARDIOVASCULAR: S1, S2 normal. No murmurs, rubs, or gallops.  ABDOMEN: Soft, nontender, nondistended. Bowel sounds present. No organomegaly or mass.  EXTREMITIES: No pedal edema, cyanosis, or clubbing.  NEUROLOGIC: Cranial nerves II through XII are intact. Muscle strength  5/5 in all extremities. Sensation intact. Gait not checked.  PSYCHIATRIC: The patient is alert and oriented x 3.  SKIN: No obvious rash, lesion, or ulcer.    LABORATORY PANEL:   CBC Recent Labs  Lab 05/16/17 0854  WBC 5.4  HGB 12.9  HCT 39.7  PLT 243   ------------------------------------------------------------------------------------------------------------------  Chemistries  Recent Labs  Lab 05/16/17 0854  NA 143  K 4.5  CL 110  CO2 25  GLUCOSE 134*  BUN 24*  CREATININE 2.00*  CALCIUM 9.1   ------------------------------------------------------------------------------------------------------------------  Cardiac Enzymes Recent Labs  Lab 05/16/17 0854  TROPONINI <0.03   ------------------------------------------------------------------------------------------------------------------  RADIOLOGY:  Dg Chest Port 1 View  Result Date: 05/16/2017 CLINICAL DATA:  Shortness of Breath EXAM: PORTABLE CHEST 1 VIEW COMPARISON:  April 20, 2017 FINDINGS: There is no edema or consolidation. The heart size and pulmonary vascularity are normal. No adenopathy. There is aortic atherosclerosis. No evident bone lesions. IMPRESSION: Aortic atherosclerosis.  No edema or consolidation. Aortic Atherosclerosis (ICD10-I70.0). Electronically Signed   By: Lowella Grip III M.D.   On: 05/16/2017 09:30    EKG:   Orders placed or performed during the hospital encounter of 05/16/17  . ED EKG  . ED EKG  . EKG 12-Lead  . EKG 12-Lead    ASSESSMENT AND PLAN:    Seairra Otani  is a 77 y.o. 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  1. acute on chronic pictorial failure due to COPD with  exacerbation ([place]) -IV Solu-Medrol and duo  nebs--change to po steroid when clinically better  -Andwith as needed nebs PRN antitussive. -uses chronic home oxygen  2.Essential hypertension -continue home medications  3.Coronary artery disease -continue home meds  4.OSA and COPD overlap syndrome (HCC) -CPAP nightly  5.GERD (gastroesophageal reflux disease) - PPI  6.CKD (chronic kidney disease), stage III ([place]) -at baseline, avoid nephrotoxins and monitor       All the records are reviewed and case discussed with Care Management/Social Workerr. Management plans discussed with the patient, family and they are in agreement.  CODE STATUS: DNR  TOTAL TIME TAKING CARE OF THIS PATIENT: 35 minutes.   POSSIBLE D/C IN 1-2 DAYS, DEPENDING ON CLINICAL CONDITION.  Note: This dictation was prepared with Dragon dictation along with smaller phrase technology. Any transcriptional errors that result from this process are unintentional.   Nicholes Mango M.D on 05/17/2017 at 1:30 PM  Between 7am to 6pm - Pager - [phone] After 6pm go to [email or web address] - password [email or web address] [place]  Office  [phone]  CC: Primary care physician; [name], NP

## 2017-05-17 NOTE — Progress Notes (Signed)
Pt does not wear Cpap at home. Does not want to wear one at this time.

## 2017-05-17 NOTE — Care Management Important Message (Signed)
Important Message  Patient Details  Name: Katherine [name] MRN: [id number] Date of Birth: March 24, 1940   Medicare Important Message Given:  Yes    Beverly Sessions, RN 05/17/2017, 11:32 AM

## 2017-05-17 NOTE — Care Management (Signed)
Patient admitted to hospital for COPD exacerbation.  Patient lives at home with husband and he provides transportation.  PCP Julian Hy.  Patient denies issues obtaining medication. Patient has chronic O2 through [place].  Patient has nebulizer,cane, shower seat, and BSC in the home. RNCM requested [place] services, awaiting approval.  Patient agreeable to home health services.  Patient states that she does not have a preference of agency. Heads up referral made to [facility] with [place]. Messaged MD about potentially getting and [facility] to determine if patient would qualify for a triology.  RNCM following

## 2017-05-17 NOTE — Consult Note (Addendum)
   [facility] CM Inpatient Consult   05/17/2017  Katherine [name] [birth year] [id number]   [facility] Care Management follow up.   Received telephone call from inpatient RNCM, Colletta [place]. Confirmed that patient will be referred for outpatient palliative care services with [facility]. Discussed that writer will not follow up for [place] Management at this time due to plan for outpatient palliative care services.   Will update [facility] Liaison.    Marthenia Rolling, MSN-Ed, RN,BSN [facility] Liaison [phone]

## 2017-05-17 NOTE — Consult Note (Addendum)
Consultation Note Date: 05/17/2017   Patient Name: Katherine [name]  DOB: 07-Jul-1940  MRN: [id number]  Age / Sex: 77 y.o., female  PCP: [name], NP Referring Physician: Nicholes Mango, MD  Reason for Consultation: Establishing goals of care  HPI/Patient Profile: Katherine [name]  is a 77 y.o. 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    Clinical Assessment and Goals of Care: Patient resting in bed on O2. She lives at home with her husband and has been married around 83 years. She has 3 living children, 1 died of a MI in her 24's. She states they call to check on her, but do not provide assistance to her. She states she uses 2 lpm of O2 at baseline. She does not use a CPAP as she cannot afford one. She states with hospital bills and doctor's bills, finances are difficult.  She states she has a cane an walker at home. She is able to cook and clean. She states she stopped smoking.   We discussed diagnosis, prognosis, GOC, EOL wishes disposition and options.  A detailed discussion was had today regarding advanced directives.  Concepts specific to code status, artifical feeding and hydration, continued IV antibiotics and rehospitalization was discussed.  The difference between a aggressive medical intervention path and a hospice comfort care path was discussed.  Values and goals of care important to patient and family were attempted to be elicited.  She states she is okay with her quality of life and would like to continue usual medical care as needed. She states she would never want a feeding tube, to be placed on a ventilator due to respiratory distress/failure, or to  have chest compressions, shocks, or a breathing tube for arrest. She adds "if I'm going to go, just let me go."   Chaplain to bedside to complete living will and POA papers.   MOST form completed for DNR, limited additional interventions, no feeding tube, IVF and abx okay.     SUMMARY OF RECOMMENDATIONS    DNR/DNI, would not want a PEG.   Recommend palliative to follow outpatient.    Code Status/Advance Care Planning:  DNR    Symptom Management:   Per primary team  Palliative Prophylaxis:   Eye Care and Oral Care   Prognosis:  Poor long term.   Discharge Planning: Home with Palliative Services      Primary Diagnoses: Present on Admission: . COPD exacerbation ([place])   I have reviewed the medical record, interviewed the patient and family, and examined the patient. The following aspects are pertinent.  Past Medical History:  Diagnosis Date  . Angioedema   . Anxiety   . Anxiety and depression   . Chronic constipation   . Chronic kidney disease    stage 3  . COPD (chronic obstructive pulmonary disease) ([place])   .  Gallstones   . GERD (gastroesophageal reflux disease)   . Hypertension   . Left ventricular hypertrophy   . Osteoarthritis   . Prurigo nodularis   . Tobacco abuse   . Vitamin D deficiency disease    Social History   Socioeconomic History  . Marital status: Married    Spouse name: Not on file  . Number of children: Not on file  . Years of education: Not on file  . Highest education level: Not on file  Occupational History  . Occupation: retired  Scientific laboratory technician  . Financial resource strain: Not on file  . Food insecurity:    Worry: Not on file    Inability: Not on file  . Transportation needs:    Medical: Not on file    Non-medical: Not on file  Tobacco Use  . Smoking status: Current Every Day Smoker    Packs/day: 0.50    Years: 60.00    Pack years: 30.00    Types: Cigarettes  . Smokeless tobacco: Never Used  Substance and Sexual  Activity  . Alcohol use: No    Alcohol/week: 0.0 oz    Comment: rare  . Drug use: No  . Sexual activity: Yes  Lifestyle  . Physical activity:    Days per week: Not on file    Minutes per session: Not on file  . Stress: Not on file  Relationships  . Social connections:    Talks on phone: Not on file    Gets together: Not on file    Attends religious service: Not on file    Active member of club or organization: Not on file    Attends meetings of clubs or organizations: Not on file    Relationship status: Not on file  Other Topics Concern  . Not on file  Social History Narrative  . Not on file   Family History  Problem Relation Age of Onset  . Alcohol abuse Mother   . Sickle cell anemia Daughter   . Hypertension Son   . Cancer Neg Hx   . COPD Neg Hx   . Diabetes Neg Hx   . Heart disease Neg Hx   . Stroke Neg Hx    Scheduled Meds: . aspirin EC  81 mg Oral Daily  . cholecalciferol  1,000 Units Oral Daily  . FLUoxetine  10 mg Oral Daily  . fluticasone furoate-vilanterol  1 puff Inhalation Daily  . hydrALAZINE  25 mg Oral BID  . loratadine  10 mg Oral Daily  . methylPREDNISolone (SOLU-MEDROL) injection  40 mg Intravenous Q8H  . pantoprazole  40 mg Oral Daily  . tiotropium  18 mcg Inhalation Daily   Continuous Infusions: PRN Meds:.albuterol, ipratropium-albuterol Medications Prior to Admission:  Prior to Admission medications   Medication Sig Start Date End Date Taking? Authorizing Provider  aspirin EC 81 MG tablet Take 81 mg by mouth daily.   Yes [provider]  cholecalciferol (VITAMIN D) 1000 units tablet Take 1,000 Units by mouth daily.   Yes [provider]  FLUoxetine (PROZAC) 10 MG capsule TAKE 1 CAPSULE(10 MG) BY MOUTH DAILY 01/30/17  Yes [name], NP  fluticasone furoate-vilanterol (BREO ELLIPTA) 100-25 MCG/INH AEPB Inhale 1 puff into the lungs daily. 04/27/16  Yes [name], MD  hydrALAZINE (APRESOLINE) 25 MG tablet TAKE 1  TABLET(25 MG) BY MOUTH TWICE DAILY 04/26/17  Yes Volney American, PA-C  ipratropium-albuterol (DUONEB) 0.5-2.5 (3) MG/3ML SOLN Take 3 mLs by nebulization every 6 (  six) hours as needed. 11/29/15  Yes [name], MD  omeprazole (PRILOSEC) 40 MG capsule Take 40 mg by mouth daily.   Yes [provider]  SPIRIVA HANDIHALER 18 MCG inhalation capsule PLACE 1 CAPSULE INTO INHALER AND INHALE EVERY DAY 04/25/17  Yes Volney American, PA-C  acetaminophen (TYLENOL) 650 MG CR tablet Take 650-1,300 mg by mouth every 8 (eight) hours as needed for pain.     [provider]  albuterol (PROVENTIL HFA) 108 (90 Base) MCG/ACT inhaler Inhale 2 puffs into the lungs every 4 (four) hours as needed for wheezing or shortness of breath. 04/25/17   Volney American, PA-C  cetirizine (ZYRTEC) 10 MG tablet TAKE 1/2 TABLET(5 MG) BY MOUTH AT BEDTIME 04/25/17   Volney American, PA-C  meclizine (ANTIVERT) 25 MG tablet TAKE 1 TABLET(25 MG) BY MOUTH THREE TIMES DAILY AS NEEDED FOR DIZZINESS 10/27/16   Guadalupe Maple, MD   Allergies  Allergen Reactions  . Bee Venom Swelling  . Enalapril Maleate Swelling  . Other     Bee stings   Review of Systems  Respiratory: Positive for shortness of breath.   Neurological: Positive for weakness.    Physical Exam  Constitutional: No distress.  Pulmonary/Chest:  Slight work of breathing noted. Speaks in complete sentences.   Neurological: She is alert.  Oriented.  Skin: Skin is warm and dry.    Vital Signs: BP 126/75 (BP Location: Right Arm)   Pulse 84   Temp 98 F (36.7 C) (Oral)   Resp (!) 28   Ht 5\' 7"  (1.702 m)   Wt 113.4 kg (250 lb)   SpO2 96%   BMI 39.16 kg/m  Pain Scale: 0-10   Pain Score: 0-No pain   SpO2: SpO2: 96 % O2 Device:SpO2: 96 % O2 Flow Rate: .O2 Flow Rate (L/min): 4 L/min  IO: Intake/output summary:   Intake/Output Summary (Last 24 hours) at 05/17/2017 1207 Last data filed at 05/17/2017 1026 Gross per 24 hour    Intake 0 ml  Output 600 ml  Net -600 ml    LBM: Last BM Date: 05/16/17 Baseline Weight: Weight: 113.4 kg (250 lb) Most recent weight: Weight: 113.4 kg (250 lb)     Palliative Assessment/Data: 50%     Time In: 10:00 Time Out: 11:10 Time Total: 70 min Greater than 50%  of this time was spent counseling and coordinating care related to the above assessment and plan.  Signed by: Asencion Gowda, NP   Please contact Palliative Medicine Team phone at [phone] for questions and concerns.  For individual provider: See Shea Evans

## 2017-05-17 NOTE — Consult Note (Signed)
   [facility] CM Inpatient Consult   05/17/2017  Katherine [name] [birth year] [id number]    Referral received for [facility] Care Management services.   Chart reviewed and noted palliative medicine consult. Recommendations appear to be for outpatient palliative services. Writer attempting to reach inpatient RNCM Colletta [place]) to verify disposition plans prior to engaging patient for [place] [facility] services.   If unable to confirm disposition plans today, writer will ask [facility] liaison to follow up tomorrow.   Marthenia Rolling, MSN-Ed, RN,BSN [facility] Liaison [phone]

## 2017-05-17 NOTE — Care Management (Signed)
Approved for [place] by Dr. Doy Hutching physician advisor.  Outpatient palliative referral made to [facility] with Hospice and [place]

## 2017-05-17 NOTE — Care Management Important Message (Signed)
Important Message  Patient Details  Name: Katherine [name] MRN: [id number] Date of Birth: 1940/05/20   Medicare Important Message Given:  Yes    Beverly Sessions, RN 05/17/2017, 11:02 AM

## 2017-05-17 NOTE — Progress Notes (Signed)
Patient asked for information on AD. Chaplain responded with booklet and began education, when the patient asked Chaplain to return when her husband is present. The patient wants to make sure that her husband knows her wishes.  Patient will have her nurse page the on-call chaplain to complete the AD.

## 2017-05-17 NOTE — Progress Notes (Signed)
New referral for out patient Palliative received from [facility]. Patient information faxed to referral. Flo Shanks RN, BSN, [facility] [place], hospital liaison [phone]

## 2017-05-18 MED ORDER — METHYLPREDNISOLONE SODIUM SUCC 40 MG IJ SOLR
40.0000 mg | Freq: Two times a day (BID) | INTRAMUSCULAR | Status: DC
  Administered 2017-05-19: 40 mg via INTRAVENOUS
  Filled 2017-05-18: qty 1

## 2017-05-18 NOTE — Care Management (Signed)
This RNCM sent message to [place] with [facility] CM to discuss Palliative services verses Hospice services in conjunction to [facility] services. In the past, palliative has been able to assist patients in outpatient would with [facility] as Palliative will not take over patient's insurance plan. Checking for verification.  Santiago Glad with palliative team said that palliative could accept [facility] services along with palliative without any financial issues.

## 2017-05-18 NOTE — Progress Notes (Signed)
[place] at [place] NAME: Katherine [name]    MR#:  [id number]  DATE OF BIRTH:  1940/08/15  SUBJECTIVE:  CHIEF COMPLAINT:  SOB is better, reporting exertional dyspnea and weakness  REVIEW OF SYSTEMS:  CONSTITUTIONAL: No fever, fatigue or weakness.  EYES: No blurred or double vision.  EARS, NOSE, AND THROAT: No tinnitus or ear pain.  RESPIRATORY: reports cough, shortness of breath, wheezing or hemoptysis.  CARDIOVASCULAR: No chest pain, orthopnea, edema.  GASTROINTESTINAL: No nausea, vomiting, diarrhea or abdominal pain.  GENITOURINARY: No dysuria, hematuria.  ENDOCRINE: No polyuria, nocturia,  HEMATOLOGY: No anemia, easy bruising or bleeding SKIN: No rash or lesion. MUSCULOSKELETAL: No joint pain or arthritis.   NEUROLOGIC: No tingling, numbness, weakness.  PSYCHIATRY: No anxiety or depression.   DRUG ALLERGIES:   Allergies  Allergen Reactions  . Bee Venom Swelling  . Enalapril Maleate Swelling  . Other     Bee stings    VITALS:  Blood pressure 134/79, pulse 90, temperature 98.5 F (36.9 C), temperature source Oral, resp. rate (!) 22, height 5\' 7"  (1.702 m), weight 113.4 kg (250 lb), SpO2 100 %.  PHYSICAL EXAMINATION:  GENERAL:  77 y.o.-year-old patient lying in the bed with no acute distress.  EYES: Pupils equal, round, reactive to light and accommodation. No scleral icterus. Extraocular muscles intact.  HEENT: Head atraumatic, normocephalic. Oropharynx and nasopharynx clear.  NECK:  Supple, no jugular venous distention. No thyroid enlargement, no tenderness.  LUNGS: Mod breath sounds bilaterally,  wheezing, rales,rhonchi or crepitation. No use of accessory muscles of respiration.  CARDIOVASCULAR: S1, S2 normal. No murmurs, rubs, or gallops.  ABDOMEN: Soft, nontender, nondistended. Bowel sounds present. No organomegaly or mass.  EXTREMITIES: No pedal edema, cyanosis, or clubbing.  NEUROLOGIC: Cranial nerves II through  XII are intact. Muscle strength 5/5 in all extremities. Sensation intact. Gait not checked.  PSYCHIATRIC: The patient is alert and oriented x 3.  SKIN: No obvious rash, lesion, or ulcer.    LABORATORY PANEL:   CBC Recent Labs  Lab 05/16/17 0854  WBC 5.4  HGB 12.9  HCT 39.7  PLT 243   ------------------------------------------------------------------------------------------------------------------  Chemistries  Recent Labs  Lab 05/16/17 0854  NA 143  K 4.5  CL 110  CO2 25  GLUCOSE 134*  BUN 24*  CREATININE 2.00*  CALCIUM 9.1   ------------------------------------------------------------------------------------------------------------------  Cardiac Enzymes Recent Labs  Lab 05/16/17 0854  TROPONINI <0.03   ------------------------------------------------------------------------------------------------------------------  RADIOLOGY:  No results found.  EKG:   Orders placed or performed during the hospital encounter of 05/16/17  . ED EKG  . ED EKG  . EKG 12-Lead  . EKG 12-Lead    ASSESSMENT AND PLAN:    Jeannie Mallinger  is a 77 y.o. 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  1. acute on chronic pictorial failure due to COPD with exacerbation (HCC) -Taper IV Solu-Medrol and duo nebs--change to po steroid when clinically better  -Andwith as needed nebs PRN antitussive. -uses chronic home oxygen  2.Essential hypertension -continue home medications hydralazine and titrate as needed  3.Coronary artery disease -continue home meds aspirin  4.OSA and COPD overlap syndrome (HCC) -CPAP nightly  5.GERD (gastroesophageal reflux disease) - PPI  6.CKD (chronic kidney disease), stage III (HCC) -at baseline,  avoid  nephrotoxins and monitor    PT assessment   All the records are reviewed and case discussed with Care Management/Social Workerr. Management plans discussed with the patient, family and they are in agreement.  CODE STATUS: DNR  TOTAL TIME TAKING CARE OF THIS PATIENT: 35 minutes.   POSSIBLE D/C IN 1-2 DAYS, DEPENDING ON CLINICAL CONDITION.  Note: This dictation was prepared with Dragon dictation along with smaller phrase technology. Any transcriptional errors that result from this process are unintentional.   Nicholes Mango M.D on 05/18/2017 at 5:06 PM  Between 7am to 6pm - Pager - [phone] After 6pm go to [email or web address] - password [email or web address] [place]  Office  [phone]  CC: Primary care physician; [name], NP

## 2017-05-18 NOTE — Consult Note (Signed)
   [facility] CM Inpatient Consult   05/18/2017  Kalese Ensz 06/27/1940 [id number]   Referral received by inpatient case manager for [facility] Management services and post hospital discharge follow up related to a diagnosis of COPD and 3 admits in 6 months. Clarified with Palliative service that [facility] is able to provide services along side Hospice and Palliative care of [place]. Patient was evaluated for community based chronic disease management services with [facility] as a benefit of patient's [facility] Medicare. Met with the patient at the bedside to explain [facility] services.Patient denied needs for transportation or medication affordability. Patient endorses her primary care provider to be [name], NP. Verbal consent obtained. Patient unable to remember phone number but stated her home number was best to reach [phone] was home number listed in her chart. Patient will receive post hospital discharge calls and be evaluated for monthly home visits. [facility] Care Management services does not interfere with or replace any services arranged by the inpatient care management team. RNCM left contact information and [facility] literature at the bedside. Made inpatient RNCM aware  [facility] will be following for care management. For additional questions please contact:   Wynonna Fitzhenry RN, [facility] Liaison  [phone]) Business Mobile [phone]) Toll free office

## 2017-05-18 NOTE — Care Management (Signed)
Patient did not qualify for NIV with [facility] per Corene Cornea with [facility].

## 2017-05-19 LAB — BASIC METABOLIC PANEL
Anion gap: 6 (ref 5–15)
BUN: 47 mg/dL — ABNORMAL HIGH (ref 6–20)
CALCIUM: 9 mg/dL (ref 8.9–10.3)
CO2: 27 mmol/L (ref 22–32)
CREATININE: 2.11 mg/dL — AB (ref 0.44–1.00)
Chloride: 108 mmol/L (ref 101–111)
GFR calc non Af Amer: 22 mL/min — ABNORMAL LOW (ref >60)
GFR, EST AFRICAN AMERICAN: 25 mL/min — AB (ref >60)
Glucose, Bld: 112 mg/dL — ABNORMAL HIGH (ref 65–99)
Potassium: 5 mmol/L (ref 3.5–5.1)
SODIUM: 141 mmol/L (ref 135–145)

## 2017-05-19 MED ORDER — ONDANSETRON HCL 4 MG/2ML IJ SOLN
4.0000 mg | Freq: Four times a day (QID) | INTRAMUSCULAR | Status: DC | PRN
  Administered 2017-05-19: 4 mg via INTRAVENOUS
  Filled 2017-05-19: qty 2

## 2017-05-19 MED ORDER — SODIUM CHLORIDE 0.9 % IV SOLN
INTRAVENOUS | Status: AC
  Administered 2017-05-19 (×2): via INTRAVENOUS

## 2017-05-19 MED ORDER — SENNOSIDES-DOCUSATE SODIUM 8.6-50 MG PO TABS: 1.0000 | ORAL_TABLET | Freq: Every evening | ORAL | Status: DC | PRN

## 2017-05-19 MED ORDER — BISACODYL 5 MG PO TBEC: 5.0000 mg | DELAYED_RELEASE_TABLET | Freq: Every day | ORAL | Status: DC | PRN

## 2017-05-19 MED ORDER — GUAIFENESIN 100 MG/5ML PO SOLN
5.0000 mL | ORAL | Status: DC | PRN
  Filled 2017-05-19: qty 5

## 2017-05-19 MED ORDER — ACETAMINOPHEN 325 MG PO TABS
650.0000 mg | ORAL_TABLET | Freq: Four times a day (QID) | ORAL | Status: DC | PRN
  Filled 2017-05-19: qty 2

## 2017-05-19 MED ORDER — PREDNISONE 50 MG PO TABS
50.0000 mg | ORAL_TABLET | Freq: Every day | ORAL | Status: DC
  Administered 2017-05-20: 50 mg via ORAL
  Filled 2017-05-19: qty 1

## 2017-05-19 NOTE — Progress Notes (Addendum)
[place] at [place] NAME: Katherine [name]    MR#:  [id number]  DATE OF BIRTH:  1941/01/04  SUBJECTIVE:  CHIEF COMPLAINT:   Better shortness of breath and cough, has generalized weakness and nausea  REVIEW OF SYSTEMS:  CONSTITUTIONAL: No fever, ahs fatigue and weakness.  EYES: No blurred or double vision.  EARS, NOSE, AND THROAT: No tinnitus or ear pain.  RESPIRATORY: reports cough, shortness of breath, wheezing or hemoptysis.  CARDIOVASCULAR: No chest pain, orthopnea, edema.  GASTROINTESTINAL: has nausea, no vomiting, diarrhea or abdominal pain.  GENITOURINARY: No dysuria, hematuria.  ENDOCRINE: No polyuria, nocturia,  HEMATOLOGY: No anemia, easy bruising or bleeding SKIN: No rash or lesion. MUSCULOSKELETAL: No joint pain or arthritis.   NEUROLOGIC: No tingling, numbness, weakness.  PSYCHIATRY: No anxiety or depression.   DRUG ALLERGIES:   Allergies  Allergen Reactions  . Bee Venom Swelling  . Enalapril Maleate Swelling  . Other     Bee stings    VITALS:  Blood pressure (!) 146/92, pulse (!) 108, temperature 98.1 F (36.7 C), temperature source Oral, resp. rate 19, height 5\' 7"  (1.702 m), weight 250 lb (113.4 kg), SpO2 99 %.  PHYSICAL EXAMINATION:  GENERAL:  77 y.o.-year-old patient lying in the bed with no acute distress.  EYES: Pupils equal, round, reactive to light and accommodation. No scleral icterus. Extraocular muscles intact.  HEENT: Head atraumatic, normocephalic. Oropharynx and nasopharynx clear.  NECK:  Supple, no jugular venous distention. No thyroid enlargement, no tenderness.  LUNGS: Normal breath sounds bilaterally,  No wheezing, rales,rhonchi or crepitation. No use of accessory muscles of respiration.  CARDIOVASCULAR: S1, S2 normal. No murmurs, rubs, or gallops.  ABDOMEN: Soft, nontender, nondistended. Bowel sounds present. No organomegaly or mass.  EXTREMITIES: No pedal edema, cyanosis, or clubbing.   NEUROLOGIC: Cranial nerves II through XII are intact. Muscle strength 5/5 in all extremities. Sensation intact. Gait not checked.  PSYCHIATRIC: The patient is alert and oriented x 3.  SKIN: No obvious rash, lesion, or ulcer.    LABORATORY PANEL:   CBC Recent Labs  Lab 05/16/17 0854  WBC 5.4  HGB 12.9  HCT 39.7  PLT 243   ------------------------------------------------------------------------------------------------------------------  Chemistries  Recent Labs  Lab 05/19/17 0519  NA 141  K 5.0  CL 108  CO2 27  GLUCOSE 112*  BUN 47*  CREATININE 2.11*  CALCIUM 9.0   ------------------------------------------------------------------------------------------------------------------  Cardiac Enzymes Recent Labs  Lab 05/16/17 0854  TROPONINI <0.03   ------------------------------------------------------------------------------------------------------------------  RADIOLOGY:  No results found.  EKG:   Orders placed or performed during the hospital encounter of 05/16/17  . ED EKG  . ED EKG  . EKG 12-Lead  . EKG 12-Lead    ASSESSMENT AND PLAN:    Masie Bermingham  is a 77 y.o. 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  1. acute on chronic pictorial failure due to COPD with exacerbation (HCC) Discontinue IV Solu-Medrol and change to prednisone taper, continue duo nebs prn. -Andwith as needed nebs PRN antitussive. -uses chronic home oxygen  2.Essential hypertension -continue home medications hydralazine and titrate as needed  3.Coronary artery disease -continue home meds aspirin  4.OSA and COPD overlap syndrome (HCC) -CPAP nightly  5.GERD (gastroesophageal reflux disease) - PPI  6.ARF on CKD (chronic  kidney disease), stage III Start IV fluids support and follow-up BMP.  Tobacco abuse.  Smoking cessation was counseled for 4 minutes.  Generalized weakness.  PT assessment is pending.   All the records are reviewed and case discussed with Care Management/Social Workerr. Management plans discussed with the patient, family and they are in agreement.  CODE STATUS: DNR  TOTAL TIME TAKING CARE OF THIS PATIENT: 32 minutes.   POSSIBLE D/C IN 1-2 DAYS, DEPENDING ON CLINICAL CONDITION.  Note: This dictation was prepared with Dragon dictation along with smaller phrase technology. Any transcriptional errors that result from this process are unintentional.   Demetrios Loll M.D on 05/19/2017 at 2:23 PM  Between 7am to 6pm - Pager - [phone] After 6pm go to [email or web address] - password [email or web address] [place] [facility]  Office  [phone]  CC: Primary care physician; [name], NP

## 2017-05-19 NOTE — Evaluation (Signed)
Physical Therapy Evaluation Patient Details Name: Katherine [name] MRN: [id number] DOB: 07-21-1940 Today's Date: 05/19/2017   History of Present Illness  Pt is a 77 y/o F presenting to hospital w/ SOB 05/16/17. Pt admitted 05/16/17 w/ diagnosis of acute on chronic exacerbation of COPD. PMHx includes: hypertensive urgency, dyspnea, CAD, COPD, angioedema, anxiety, constipation, GERD, OA.     Clinical Impression  Pt demonstrated bed mobility (CGA), transfers and ambulation (95 ft, 70 ft) (RW CGA) (+2 chair follow). Pt reports SOB and weakness during ambulation, while on 3L oxygen (see ambulation for details). Pt would benefit from skilled PT to progress strength, ambulation, and activity tolerance. Recommend HHPT w/ intermittent supervision upon discharge from acute hospitalization.    Follow Up Recommendations Home health PT;Supervision - Intermittent    Equipment Recommendations  Rolling walker with 5" wheels;3in1 (PT)    Recommendations for Other Services       Precautions / Restrictions Precautions Precautions: Fall Restrictions Weight Bearing Restrictions: No      Mobility  Bed Mobility Overal bed mobility: Needs Assistance Bed Mobility: Supine to Sit     Supine to sit: Min guard     General bed mobility comments: Pt required extra time and effort; CGA for safety  Transfers Overall transfer level: Needs assistance Equipment used: Rolling walker (2 wheeled) Transfers: Sit to/from Stand           General transfer comment: Pt required extra time and effort; CGA for safety; Pt demonstrated good technique w/ RW  Ambulation/Gait Ambulation/Gait assistance: Min guard;+2 safety/equipment Ambulation Distance (Feet): 165 Feet(95,70) Assistive device: Rolling walker (2 wheeled) Gait Pattern/deviations: Step-through pattern;Decreased step length - right;Decreased step length - left Gait velocity: decreased   General Gait Details: Pt demonstrated safe use of RW, but reported  feeling weak and difficulties breathing. HR and O2 monitored throughout ambulation (HR 87-110 bpm; O2 sats >95%) while on 3L oxygen. Pt ambulated 95 ft and requested 2 min standing resting break, then stated she wanted to continue walking. Pt then ambulated another 60 ft and requested to sit due to fatigue. Pt returned to room in recliner.  Stairs            Wheelchair Mobility    Modified Rankin (Stroke Patients Only)       Balance Overall balance assessment: Needs assistance Sitting-balance support: No upper extremity supported Sitting balance-Leahy Scale: Fair Sitting balance - Comments: Pt used BUE on bed to maintain balance     Standing balance-Leahy Scale: Poor Standing balance comment: required CGA RW for stability                             Pertinent Vitals/Pain Pain Assessment: 0-10 Pain Score: 2  Pain Location: R knee Pain Intervention(s): Limited activity within patient's tolerance;Monitored during session;Repositioned    Home Living Family/patient expects to be discharged to:: Private residence Living Arrangements: Spouse/significant other Available Help at Discharge: Family;Available 24 hours/day Type of Home: Apartment Home Access: Level entry     Home Layout: One level Home Equipment: Walker - 2 wheels;Cane - single point;Toilet riser      Prior Function Level of Independence: Independent with assistive device(s)         Comments: Pt reports that she is independent w/ a spc for household and community distances; Pt reports husband helps w/ ADLs and driving     Hand Dominance   Dominant Hand: Right    Extremity/Trunk Assessment   Upper Extremity  Assessment Upper Extremity Assessment: Generalized weakness;RUE deficits/detail;LUE deficits/detail RUE Deficits / Details: Pt AROM WNL and strength 3+/5 MMT shoulder flexion, elbow flexion/extension, supination/pronation LUE Deficits / Details: Pt AROM WNL and strength 3+/5 MMT  shoulder flexion, elbow flexion/extension, supination/pronation    Lower Extremity Assessment Lower Extremity Assessment: Generalized weakness;RLE deficits/detail;LLE deficits/detail RLE Deficits / Details: Pt AROM WNL and strength at least 2/5 MMT hip flexion, knee flexion/extension, dorsiflexion/plantarflexion LLE Deficits / Details: Pt AROM WNL and strength at least 2/5 MMT hip flexion, knee flexion/extension, dorsiflexion/plantarflexion    Cervical / Trunk Assessment Cervical / Trunk Assessment: Kyphotic  Communication   Communication: No difficulties  Cognition Arousal/Alertness: Awake/alert Behavior During Therapy: WFL for tasks assessed/performed Overall Cognitive Status: Within Functional Limits for tasks assessed                                 General Comments: Pt A&O x4      General Comments  Pt agreeable to session    Exercises     Assessment/Plan    PT Assessment Patient needs continued PT services  PT Problem List Decreased strength;Decreased range of motion;Decreased activity tolerance;Decreased knowledge of use of DME;Decreased safety awareness;Decreased balance;Decreased knowledge of precautions;Decreased mobility;Decreased coordination;Pain       PT Treatment Interventions DME instruction;Balance training;Gait training;Stair training;Functional mobility training;Therapeutic activities;Therapeutic exercise;Patient/family education    PT Goals (Current goals can be found in the Care Plan section)  Acute Rehab PT Goals Patient Stated Goal: to go home PT Goal Formulation: With patient Time For Goal Achievement: 06/02/17 Potential to Achieve Goals: Good    Frequency Min 2X/week   Barriers to discharge        Co-evaluation               AM-PAC PT "6 Clicks" Daily Activity  Outcome Measure Difficulty turning over in bed (including adjusting bedclothes, sheets and blankets)?: A Little Difficulty moving from lying on back to sitting  on the side of the bed? : A Little Difficulty sitting down on and standing up from a chair with arms (e.g., wheelchair, bedside commode, etc,.)?: Unable Help needed moving to and from a bed to chair (including a wheelchair)?: A Little Help needed walking in hospital room?: A Little Help needed climbing 3-5 steps with a railing? : A Lot 6 Click Score: 15    End of Session Equipment Utilized During Treatment: Gait belt;Oxygen Activity Tolerance: Patient limited by fatigue;Other (comment)(SOB) Patient left: in chair;with call bell/phone within reach;with chair alarm set(B heels elevated by pillow) Nurse Communication: Mobility status(R knee pain) PT Visit Diagnosis: Unsteadiness on feet (R26.81);Other abnormalities of gait and mobility (R26.89);Difficulty in walking, not elsewhere classified (R26.2);Pain;Muscle weakness (generalized) (M62.81) Pain - Right/Left: Right Pain - part of body: Knee    Time: 6333-5456 PT Time Calculation (min) (ACUTE ONLY): 32 min   Charges:         PT G Codes:         Marck Mcclenny Mondrian-Pardue, SPT 05/19/2017, 4:48 PM

## 2017-05-20 LAB — BASIC METABOLIC PANEL
Anion gap: 3 — ABNORMAL LOW (ref 5–15)
BUN: 51 mg/dL — ABNORMAL HIGH (ref 6–20)
CHLORIDE: 109 mmol/L (ref 101–111)
CO2: 27 mmol/L (ref 22–32)
CREATININE: 2.25 mg/dL — AB (ref 0.44–1.00)
Calcium: 8.7 mg/dL — ABNORMAL LOW (ref 8.9–10.3)
GFR calc non Af Amer: 20 mL/min — ABNORMAL LOW (ref >60)
GFR, EST AFRICAN AMERICAN: 23 mL/min — AB (ref >60)
Glucose, Bld: 94 mg/dL (ref 65–99)
POTASSIUM: 4.8 mmol/L (ref 3.5–5.1)
SODIUM: 139 mmol/L (ref 135–145)

## 2017-05-20 LAB — BLOOD GAS, VENOUS
ACID-BASE DEFICIT: 1.1 mmol/L (ref 0.0–2.0)
BICARBONATE: 26.1 mmol/L (ref 20.0–28.0)
O2 SAT: 93.3 %
PATIENT TEMPERATURE: 37
pCO2, Ven: 53 mmHg (ref 44.0–60.0)
pH, Ven: 7.3 (ref 7.250–7.430)
pO2, Ven: 75 mmHg — ABNORMAL HIGH (ref 32.0–45.0)

## 2017-05-20 MED ORDER — SODIUM CHLORIDE 0.9 % IV SOLN
INTRAVENOUS | Status: DC
  Administered 2017-05-20: 22:00:00 via INTRAVENOUS

## 2017-05-20 MED ORDER — PREDNISONE 20 MG PO TABS
40.0000 mg | ORAL_TABLET | Freq: Every day | ORAL | Status: DC
  Administered 2017-05-21: 40 mg via ORAL
  Filled 2017-05-20: qty 2

## 2017-05-20 NOTE — Progress Notes (Signed)
[facility] at [place] NAME: Katherine [name]    MR#:  [id number]  DATE OF BIRTH:  1941-01-24  SUBJECTIVE:  CHIEF COMPLAINT:   No shortness of breath or cough, has generalized weakness and poor oral intake.  REVIEW OF SYSTEMS:  CONSTITUTIONAL: No fever, has fatigue and weakness.  EYES: No blurred or double vision.  EARS, NOSE, AND THROAT: No tinnitus or ear pain.  RESPIRATORY: No cough, shortness of breath, wheezing or hemoptysis.  CARDIOVASCULAR: No chest pain, orthopnea, edema.  GASTROINTESTINAL: no nausea, no vomiting, diarrhea or abdominal pain.  GENITOURINARY: No dysuria, hematuria.  ENDOCRINE: No polyuria, nocturia,  HEMATOLOGY: No anemia, easy bruising or bleeding SKIN: No rash or lesion. MUSCULOSKELETAL: No joint pain or arthritis.   NEUROLOGIC: No tingling, numbness, weakness.  PSYCHIATRY: No anxiety or depression.   DRUG ALLERGIES:   Allergies  Allergen Reactions  . Bee Venom Swelling  . Enalapril Maleate Swelling  . Other     Bee stings    VITALS:  Blood pressure 115/75, pulse 72, temperature 97.8 F (36.6 C), temperature source Oral, resp. rate 20, height 5\' 7"  (1.702 m), weight 250 lb (113.4 kg), SpO2 100 %.  PHYSICAL EXAMINATION:  GENERAL:  77 y.o.-year-old patient lying in the bed with no acute distress.  Obesity. EYES: Pupils equal, round, reactive to light and accommodation. No scleral icterus. Extraocular muscles intact.  HEENT: Head atraumatic, normocephalic. Oropharynx and nasopharynx clear.  NECK:  Supple, no jugular venous distention. No thyroid enlargement, no tenderness.  LUNGS: Normal breath sounds bilaterally,  No wheezing, rales,rhonchi or crepitation. No use of accessory muscles of respiration.  CARDIOVASCULAR: S1, S2 normal. No murmurs, rubs, or gallops.  ABDOMEN: Soft, nontender, nondistended. Bowel sounds present. No organomegaly or mass.  EXTREMITIES: No pedal edema, cyanosis, or clubbing.   NEUROLOGIC: Cranial nerves II through XII are intact. Muscle strength 5/5 in all extremities. Sensation intact. Gait not checked.  PSYCHIATRIC: The patient is alert and oriented x 3.  SKIN: No obvious rash, lesion, or ulcer.    LABORATORY PANEL:   CBC Recent Labs  Lab 05/16/17 0854  WBC 5.4  HGB 12.9  HCT 39.7  PLT 243   ------------------------------------------------------------------------------------------------------------------  Chemistries  Recent Labs  Lab 05/20/17 0427  NA 139  K 4.8  CL 109  CO2 27  GLUCOSE 94  BUN 51*  CREATININE 2.25*  CALCIUM 8.7*   ------------------------------------------------------------------------------------------------------------------  Cardiac Enzymes Recent Labs  Lab 05/16/17 0854  TROPONINI <0.03   ------------------------------------------------------------------------------------------------------------------  RADIOLOGY:  No results found.  EKG:   Orders placed or performed during the hospital encounter of 05/16/17  . ED EKG  . ED EKG  . EKG 12-Lead  . EKG 12-Lead    ASSESSMENT AND PLAN:    Katherine [name]  is a 77 y.o. 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  1. acute on chronic pictorial failure due to COPD with exacerbation (HCC) Discontinued IV Solu-Medrol and changed to prednisone taper, continue duo nebs prn. -Andwith as needed nebs PRN antitussive. -uses chronic home oxygen 3 L [place].  2.Essential hypertension -continue home medications hydralazine and titrate as needed  3.Coronary artery disease -continue home meds aspirin  4.OSA and COPD overlap syndrome (HCC) -CPAP nightly  5.GERD (gastroesophageal reflux disease) - PPI  6.ARF on CKD  (chronic kidney disease), stage III, worsening.  Possible due to poor oral intake. Started IV fluids support and follow-up BMP.  Tobacco abuse.  Smoking cessation was counseled for 4 minutes.  Generalized weakness.  PT assessment: HHPT.   All the records are reviewed and case discussed with Care Management/Social Workerr. Management plans discussed with the patient, family and they are in agreement.  CODE STATUS: DNR  TOTAL TIME TAKING CARE OF THIS PATIENT: 33 minutes.   POSSIBLE D/C IN 1-2 DAYS, DEPENDING ON CLINICAL CONDITION.  Note: This dictation was prepared with Dragon dictation along with smaller phrase technology. Any transcriptional errors that result from this process are unintentional.   Demetrios Loll M.D on 05/20/2017 at 1:30 PM  Between 7am to 6pm - Pager - [phone] After 6pm go to [email or web address] - password [email or web address] [facility]  Office  [phone]  CC: Primary care physician; [name], NP

## 2017-05-21 LAB — BASIC METABOLIC PANEL
ANION GAP: 4 — AB (ref 5–15)
BUN: 47 mg/dL — ABNORMAL HIGH (ref 6–20)
CALCIUM: 8.7 mg/dL — AB (ref 8.9–10.3)
CO2: 26 mmol/L (ref 22–32)
Chloride: 110 mmol/L (ref 101–111)
Creatinine, Ser: 2.09 mg/dL — ABNORMAL HIGH (ref 0.44–1.00)
GFR calc Af Amer: 26 mL/min — ABNORMAL LOW (ref >60)
GFR calc non Af Amer: 22 mL/min — ABNORMAL LOW (ref >60)
GLUCOSE: 103 mg/dL — AB (ref 65–99)
Potassium: 5 mmol/L (ref 3.5–5.1)
Sodium: 140 mmol/L (ref 135–145)

## 2017-05-21 MED ORDER — PREDNISONE 10 MG PO TABS: ORAL_TABLET | ORAL | 0 refills | Status: DC

## 2017-05-21 MED ORDER — GUAIFENESIN 100 MG/5ML PO SOLN: 5.0000 mL | ORAL | 0 refills | Status: DC | PRN

## 2017-05-21 NOTE — Care Management Note (Signed)
Case Management Note  Patient Details  Name: Katherine [name] MRN: [id number] Date of Birth: Apr 25, 1940   Katherine [name] with [facility] notified of discharge, COPD protocol signed by MD.  Katherine [name] with outpatient palliative notified of discharge.  Katherine [name] with [facility] notified of discharge.  Katherine [name] to transport and bring portable O2 tank.  RNCM signing off.   Subjective/Objective:                    Action/Plan:   Expected Discharge Date:  05/21/17               Expected Discharge Plan:  [place]  In-House Referral:     Discharge planning Services  CM Consult  Post Acute Care Choice:    Choice offered to:  Patient  DME Arranged:    DME Agency:     HH Arranged:  PT, RN, Social Work [employer] Agency:  [place]  Status of Service:  Completed, signed off  If discussed at [employer], dates discussed:    Additional Comments:  Katherine Sessions, RN 05/21/2017, 12:10 PM

## 2017-05-21 NOTE — Care Management Important Message (Signed)
Important Message  Patient Details  Name: Katherine [name] MRN: [id number] Date of Birth: 1940/07/20   Medicare Important Message Given:  Yes    Beverly Sessions, RN 05/21/2017, 12:04 PM

## 2017-05-21 NOTE — Progress Notes (Signed)
Spoke with Dr.Chen via phone about patient's IV infiltration. Dr.Chen verbalized to stop IV fluids and not to insert a new IV. Katherine [name]

## 2017-05-21 NOTE — Progress Notes (Signed)
05/21/2017 11:55 AM  Katherine [name] to be D/C'd Home per MD order.  Discussed prescriptions and follow up appointments with the patient. Prescriptions given to patient, medication list explained in detail. Pt verbalized understanding.  Allergies as of 05/21/2017      Reactions   Bee Venom Swelling   Enalapril Maleate Swelling   Other    Bee stings      Medication List    TAKE these medications   acetaminophen 650 MG CR tablet Commonly known as:  TYLENOL Take 650-1,300 mg by mouth every 8 (eight) hours as needed for pain.   albuterol 108 (90 Base) MCG/ACT inhaler Commonly known as:  PROVENTIL HFA Inhale 2 puffs into the lungs every 4 (four) hours as needed for wheezing or shortness of breath.   aspirin EC 81 MG tablet Take 81 mg by mouth daily.   cetirizine 10 MG tablet Commonly known as:  ZYRTEC TAKE 1/2 TABLET(5 MG) BY MOUTH AT BEDTIME   cholecalciferol 1000 units tablet Commonly known as:  VITAMIN D Take 1,000 Units by mouth daily.   FLUoxetine 10 MG capsule Commonly known as:  PROZAC TAKE 1 CAPSULE(10 MG) BY MOUTH DAILY   fluticasone furoate-vilanterol 100-25 MCG/INH Aepb Commonly known as:  BREO ELLIPTA Inhale 1 puff into the lungs daily.   guaiFENesin 100 MG/5ML Soln Commonly known as:  ROBITUSSIN Take 5 mLs (100 mg total) by mouth every 4 (four) hours as needed for cough or to loosen phlegm.   hydrALAZINE 25 MG tablet Commonly known as:  APRESOLINE TAKE 1 TABLET(25 MG) BY MOUTH TWICE DAILY   ipratropium-albuterol 0.5-2.5 (3) MG/3ML Soln Commonly known as:  DUONEB Take 3 mLs by nebulization every 6 (six) hours as needed.   meclizine 25 MG tablet Commonly known as:  ANTIVERT TAKE 1 TABLET(25 MG) BY MOUTH THREE TIMES DAILY AS NEEDED FOR DIZZINESS   omeprazole 40 MG capsule Commonly known as:  PRILOSEC Take 40 mg by mouth daily.   predniSONE 10 MG tablet Commonly known as:  DELTASONE 40 mg po daily for 1 day, 30 mg po daily for 1 day, 20 mg po daily for  1 day, 10 mg po daily for 1 day.   SPIRIVA HANDIHALER 18 MCG inhalation capsule Generic drug:  tiotropium PLACE 1 CAPSULE INTO INHALER AND INHALE EVERY DAY       Vitals:   05/20/17 2005 05/21/17 0428  BP: 118/65 (!) 149/113  Pulse: 91 91  Resp: 20 20  Temp: 98 F (36.7 C) 97.8 F (36.6 C)  SpO2: 96% 99%    Skin clean, dry and intact without evidence of skin break down, no evidence of skin tears noted. IV catheter discontinued intact. Site without signs and symptoms of complications. Dressing and pressure applied. Pt denies pain at this time. No complaints noted.  An After Visit Summary was printed and given to the patient. Patient escorted via [place], and D/C home via private auto.  Katherine [name]

## 2017-05-21 NOTE — Discharge Summary (Signed)
[facility] [place] at [place] NAME: Katherine [name]    MR#:  [id number]  DATE OF BIRTH:  12-18-1940  DATE OF ADMISSION:  05/16/2017   ADMITTING PHYSICIAN: [name], MD  DATE OF DISCHARGE: 05/21/2017 12:11 PM  PRIMARY CARE PHYSICIAN: [name], NP   ADMISSION DIAGNOSIS:  COPD exacerbation ([place]) [J44.1] Hypertensive urgency [I16.0] Dyspnea, unspecified type [R06.00] DISCHARGE DIAGNOSIS:  Active Problems:   COPD exacerbation ([place])  SECONDARY DIAGNOSIS:   Past Medical History:  Diagnosis Date  . Angioedema   . Anxiety   . Anxiety and depression   . Chronic constipation   . Chronic kidney disease    stage 3  . COPD (chronic obstructive pulmonary disease) ([place])   . Gallstones   . GERD (gastroesophageal reflux disease)   . Hypertension   . Left ventricular hypertrophy   . Osteoarthritis   . Prurigo nodularis   . Tobacco abuse   . 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  1.acute on chronic pictorial failure due to COPD with exacerbation (HCC) Discontinued IV Solu-Medrol and changed to prednisone taper, continue duo nebs prn. -Andwith as needed nebs PRN antitussive. -uses chronic home oxygen 3 L [place].  2.Essential hypertension -continue home medications hydralazine and titrate as needed  3.Coronary artery disease -continue home meds aspirin  4.OSA and COPD overlap syndrome (HCC) -CPAP nightly  5.GERD (gastroesophageal reflux disease) - PPI  6.ARF on CKD (chronic kidney disease), stage III,  Possible due to poor oral intake.  Better oral intake. Improving with IV fluids support and follow-up  BMP as outpatient.  Tobacco abuse.  Smoking cessation was counseled for 4 minutes.  Generalized weakness.  PT assessment: HHPT. DISCHARGE CONDITIONS:  Stable, discharged to home with home health and PT today. CONSULTS OBTAINED:   DRUG ALLERGIES:   Allergies  Allergen Reactions  . Bee Venom Swelling  . Enalapril Maleate Swelling  . Other     Bee stings   DISCHARGE MEDICATIONS:   Allergies as of 05/21/2017      Reactions   Bee Venom Swelling   Enalapril Maleate Swelling   Other    Bee stings      Medication List    TAKE these medications   acetaminophen 650 MG CR tablet Commonly known as:  TYLENOL Take 650-1,300 mg by mouth every 8 (eight) hours as needed for pain.   albuterol 108 (90 Base) MCG/ACT inhaler Commonly known as:  PROVENTIL HFA Inhale 2 puffs into the lungs every 4 (four) hours as needed for wheezing or shortness of breath.   aspirin EC 81 MG tablet Take 81 mg by mouth daily.   cetirizine 10 MG tablet Commonly known as:  ZYRTEC TAKE 1/2 TABLET(5 MG) BY MOUTH AT BEDTIME   cholecalciferol 1000 units tablet Commonly known as:  VITAMIN D Take 1,000 Units by mouth daily.   FLUoxetine 10 MG capsule Commonly known as:  PROZAC TAKE 1 CAPSULE(10 MG) BY MOUTH DAILY   fluticasone furoate-vilanterol 100-25 MCG/INH Aepb Commonly known as:  BREO ELLIPTA Inhale 1 puff into the lungs daily.   guaiFENesin 100 MG/5ML Soln Commonly known as:  ROBITUSSIN Take 5 mLs (100 mg total) by mouth every 4 (four) hours  as needed for cough or to loosen phlegm.   hydrALAZINE 25 MG tablet Commonly known as:  APRESOLINE TAKE 1 TABLET(25 MG) BY MOUTH TWICE DAILY   ipratropium-albuterol 0.5-2.5 (3) MG/3ML Soln Commonly known as:  DUONEB Take 3 mLs by nebulization every 6 (six) hours as needed.   meclizine 25 MG tablet Commonly known as:  ANTIVERT TAKE 1 TABLET(25 MG) BY MOUTH THREE TIMES DAILY AS NEEDED FOR DIZZINESS   omeprazole 40 MG capsule Commonly known as:   PRILOSEC Take 40 mg by mouth daily.   predniSONE 10 MG tablet Commonly known as:  DELTASONE 40 mg po daily for 1 day, 30 mg po daily for 1 day, 20 mg po daily for 1 day, 10 mg po daily for 1 day.   SPIRIVA HANDIHALER 18 MCG inhalation capsule Generic drug:  tiotropium PLACE 1 CAPSULE INTO INHALER AND INHALE EVERY DAY        DISCHARGE INSTRUCTIONS:  See AVS. If you experience worsening of your admission symptoms, develop shortness of breath, life threatening emergency, suicidal or homicidal thoughts you must seek medical attention immediately by calling 911 or calling your MD immediately  if symptoms less severe.  You Must read complete instructions/literature along with all the possible adverse reactions/side effects for all the Medicines you take and that have been prescribed to you. Take any new Medicines after you have completely understood and accpet all the possible adverse reactions/side effects.   Please note  You were cared for by a hospitalist during your hospital stay. If you have any questions about your discharge medications or the care you received while you were in the hospital after you are discharged, you can call the unit and asked to speak with the hospitalist on call if the hospitalist that took care of you is not available. Once you are discharged, your primary care physician will handle any further medical issues. Please note that NO REFILLS for any discharge medications will be authorized once you are discharged, as it is imperative that you return to your primary care physician (or establish a relationship with a primary care physician if you do not have one) for your aftercare needs so that they can reassess your need for medications and monitor your lab values.    On the day of Discharge:  VITAL SIGNS:  Blood pressure (!) 149/113, pulse 91, temperature 97.8 F (36.6 C), temperature source Oral, resp. rate 20, height 5\' 7"  (1.702 m), weight 250 lb (113.4 kg),  SpO2 99 %. PHYSICAL EXAMINATION:  GENERAL:  77 y.o.-year-old patient lying in the bed with no acute distress.  Obesity. EYES: Pupils equal, round, reactive to light and accommodation. No scleral icterus. Extraocular muscles intact.  HEENT: Head atraumatic, normocephalic. Oropharynx and nasopharynx clear.  NECK:  Supple, no jugular venous distention. No thyroid enlargement, no tenderness.  LUNGS: Normal breath sounds bilaterally, no wheezing, rales,rhonchi or crepitation. No use of accessory muscles of respiration.  CARDIOVASCULAR: S1, S2 normal. No murmurs, rubs, or gallops.  ABDOMEN: Soft, non-tender, non-distended. Bowel sounds present. No organomegaly or mass.  EXTREMITIES: No pedal edema, cyanosis, or clubbing.  NEUROLOGIC: Cranial nerves II through XII are intact. Muscle strength 4/5 in all extremities. Sensation intact. Gait not checked.  PSYCHIATRIC: The patient is alert and oriented x 3.  SKIN: No obvious rash, lesion, or ulcer.  DATA REVIEW:   CBC Recent Labs  Lab 05/16/17 0854  WBC 5.4  HGB 12.9  HCT 39.7  PLT 243    Chemistries  Recent  Labs  Lab 05/21/17 0434  NA 140  K 5.0  CL 110  CO2 26  GLUCOSE 103*  BUN 47*  CREATININE 2.09*  CALCIUM 8.7*     Microbiology Results  Results for orders placed or performed during the hospital encounter of 04/03/16  Body fluid culture     Status: None   Collection Time: 04/03/16 12:15 PM  Result Value Ref Range Status   Specimen Description KNEE RIGHT KNEE  Final   Special Requests NONE  Final   Gram Stain   Final    ABUNDANT WBC PRESENT, PREDOMINANTLY MONONUCLEAR NO ORGANISMS SEEN    Culture   Final    NO GROWTH 3 DAYS Performed at [facility], [place] [address] [place], [place] [address]    Report Status 04/06/2016 FINAL  Final    RADIOLOGY:  No results found.   Management plans discussed with the patient, her husband and they are in agreement.  CODE STATUS: Prior   TOTAL TIME TAKING CARE OF THIS  PATIENT: 33 minutes.    Demetrios Loll M.D on 05/21/2017 at 4:57 PM  Between 7am to 6pm - Pager - [phone]  After 6pm go to [email or web address] - Proofreader  [facility]  Office  [phone]  CC: Primary care physician; [name], NP   Note: This dictation was prepared with Dragon dictation along with smaller phrase technology. Any transcriptional errors that result from this process are unintentional.

## 2017-05-21 NOTE — Discharge Instructions (Signed)
Heart healthy diet. HHPT Continue home O2 [place] 3L.

## 2017-05-21 NOTE — Progress Notes (Signed)
Chaplain followed up with patient and offered prayer, emotional support, and active listening. Patient asked for specific prayers for her children. Chaplain complied with the patient's request.

## 2017-05-22 DIAGNOSIS — N183 Chronic kidney disease, stage 3 (moderate): Secondary | ICD-10-CM | POA: Diagnosis not present

## 2017-05-22 DIAGNOSIS — F17210 Nicotine dependence, cigarettes, uncomplicated: Secondary | ICD-10-CM | POA: Diagnosis not present

## 2017-05-22 DIAGNOSIS — F329 Major depressive disorder, single episode, unspecified: Secondary | ICD-10-CM | POA: Diagnosis not present

## 2017-05-22 DIAGNOSIS — M1990 Unspecified osteoarthritis, unspecified site: Secondary | ICD-10-CM | POA: Diagnosis not present

## 2017-05-22 DIAGNOSIS — I2510 Atherosclerotic heart disease of native coronary artery without angina pectoris: Secondary | ICD-10-CM | POA: Diagnosis not present

## 2017-05-22 DIAGNOSIS — I129 Hypertensive chronic kidney disease with stage 1 through stage 4 chronic kidney disease, or unspecified chronic kidney disease: Secondary | ICD-10-CM | POA: Diagnosis not present

## 2017-05-22 DIAGNOSIS — J439 Emphysema, unspecified: Secondary | ICD-10-CM | POA: Diagnosis not present

## 2017-05-22 DIAGNOSIS — G4733 Obstructive sleep apnea (adult) (pediatric): Secondary | ICD-10-CM | POA: Diagnosis not present

## 2017-05-22 DIAGNOSIS — K219 Gastro-esophageal reflux disease without esophagitis: Secondary | ICD-10-CM | POA: Diagnosis not present

## 2017-05-23 ENCOUNTER — Telehealth: Payer: Self-pay | Admitting: Unknown Physician Specialty

## 2017-05-23 ENCOUNTER — Telehealth: Payer: Self-pay

## 2017-05-23 DIAGNOSIS — N183 Chronic kidney disease, stage 3 (moderate): Secondary | ICD-10-CM | POA: Diagnosis not present

## 2017-05-23 DIAGNOSIS — F17210 Nicotine dependence, cigarettes, uncomplicated: Secondary | ICD-10-CM | POA: Diagnosis not present

## 2017-05-23 DIAGNOSIS — I129 Hypertensive chronic kidney disease with stage 1 through stage 4 chronic kidney disease, or unspecified chronic kidney disease: Secondary | ICD-10-CM | POA: Diagnosis not present

## 2017-05-23 DIAGNOSIS — G4733 Obstructive sleep apnea (adult) (pediatric): Secondary | ICD-10-CM | POA: Diagnosis not present

## 2017-05-23 DIAGNOSIS — M1990 Unspecified osteoarthritis, unspecified site: Secondary | ICD-10-CM | POA: Diagnosis not present

## 2017-05-23 DIAGNOSIS — F329 Major depressive disorder, single episode, unspecified: Secondary | ICD-10-CM | POA: Diagnosis not present

## 2017-05-23 DIAGNOSIS — K219 Gastro-esophageal reflux disease without esophagitis: Secondary | ICD-10-CM | POA: Diagnosis not present

## 2017-05-23 DIAGNOSIS — J439 Emphysema, unspecified: Secondary | ICD-10-CM | POA: Diagnosis not present

## 2017-05-23 DIAGNOSIS — I2510 Atherosclerotic heart disease of native coronary artery without angina pectoris: Secondary | ICD-10-CM | POA: Diagnosis not present

## 2017-05-23 NOTE — Telephone Encounter (Signed)
I have made the 1st attempt to contact the patient or family member in charge, in order to follow up from recently being discharged from the hospital. I will attempt to call back at a later time.

## 2017-05-23 NOTE — Telephone Encounter (Signed)
I have made the 1st attempt to contact the patient or family member in charge, in order to follow up from recently being discharged from the hospital.

## 2017-05-23 NOTE — Telephone Encounter (Signed)
Copied from [place] [id number]. Topic: Quick Communication - See Telephone Encounter >> May 23, 2017 10:04 AM Vernona Rieger wrote: CRM for notification. See Telephone encounter for: 05/23/17.  Debra from [facility] wanted to let Katherine [name] know that she was discharged [facility] on 4/8. They are requesting to have a palliative consult for her. She also faxed over the request, if it is okay please fax back to [phone]

## 2017-05-23 NOTE — Telephone Encounter (Signed)
Didn't we just do this? OK to give verbal if different from previous message

## 2017-05-23 NOTE — Telephone Encounter (Signed)
Copied from [place] [id number]. Topic: Quick Communication - See Telephone Encounter >> May 23, 2017  2:45 PM Synthia Innocent wrote: CRM for notification. See Telephone encounter for: 05/23/17. Requesting verbal orders for 2x a week for 4 weeks for PT. Requesting order for Incentive Spirometer

## 2017-05-23 NOTE — Telephone Encounter (Signed)
Ok to give verbal orders?

## 2017-05-23 NOTE — Telephone Encounter (Signed)
Copied from [place] [id number]. Topic: Quick Communication - See Telephone Encounter >> May 23, 2017  8:08 AM Synthia Innocent wrote: CRM for notification. See Telephone encounter for: 05/23/17. Requesting verbal order for plan of care for nursing, 2x a week for 3 weeks and 1x a week for 6 weeks

## 2017-05-23 NOTE — Telephone Encounter (Signed)
Called and let Amy know that Katherine [name] gave the OK for verbal orders.

## 2017-05-24 ENCOUNTER — Other Ambulatory Visit: Payer: Self-pay | Admitting: *Deleted

## 2017-05-24 ENCOUNTER — Encounter: Payer: Self-pay | Admitting: *Deleted

## 2017-05-24 DIAGNOSIS — M1990 Unspecified osteoarthritis, unspecified site: Secondary | ICD-10-CM | POA: Diagnosis not present

## 2017-05-24 DIAGNOSIS — N183 Chronic kidney disease, stage 3 (moderate): Secondary | ICD-10-CM | POA: Diagnosis not present

## 2017-05-24 DIAGNOSIS — F17210 Nicotine dependence, cigarettes, uncomplicated: Secondary | ICD-10-CM | POA: Diagnosis not present

## 2017-05-24 DIAGNOSIS — G4733 Obstructive sleep apnea (adult) (pediatric): Secondary | ICD-10-CM | POA: Diagnosis not present

## 2017-05-24 DIAGNOSIS — I129 Hypertensive chronic kidney disease with stage 1 through stage 4 chronic kidney disease, or unspecified chronic kidney disease: Secondary | ICD-10-CM | POA: Diagnosis not present

## 2017-05-24 DIAGNOSIS — J439 Emphysema, unspecified: Secondary | ICD-10-CM | POA: Diagnosis not present

## 2017-05-24 DIAGNOSIS — F329 Major depressive disorder, single episode, unspecified: Secondary | ICD-10-CM | POA: Diagnosis not present

## 2017-05-24 DIAGNOSIS — I2510 Atherosclerotic heart disease of native coronary artery without angina pectoris: Secondary | ICD-10-CM | POA: Diagnosis not present

## 2017-05-24 DIAGNOSIS — K219 Gastro-esophageal reflux disease without esophagitis: Secondary | ICD-10-CM | POA: Diagnosis not present

## 2017-05-24 NOTE — Telephone Encounter (Signed)
Called and left Daisy with [facility] know that Apolonio Schneiders gave the OK for verbal orders. Needs the order for the incentive spirometer sent to [facility].

## 2017-05-24 NOTE — Telephone Encounter (Signed)
Order is written on RX pad. Awaiting provider signature to fax.

## 2017-05-24 NOTE — Telephone Encounter (Signed)
I believe this should be written on an rx and sent in as it is equipment

## 2017-05-24 NOTE — Patient Outreach (Signed)
[facility]) Care Management   05/24/2017  Alleyne Lac  02-Feb-1941 [id number]  Unsuccessful telephone encounter for [name], 77 year old female- follow up on referral from [place] hospital liaison follow up on recent hospitalization April 4-8,2019 for COPD exacerbation, Hypertensive  Crisis, 3 admits in 6 months.  Pt's history includes but not limited to GERD, CKD, COPD, CAD, Essential  Hypertension.   View in EMR PCP office does transition of care call- RN CM  to follow for care management.   HIPAA compliant voice message left on both home numbers listed [phone] and [phone] with  RN CM's contact information.    Plan:  If no response, unable to contact letter to be sent to pt with plan to follow up again telephonically  in 4 days.     [name].   [place]  [phone]

## 2017-05-24 NOTE — Telephone Encounter (Signed)
Received fax from [facility]. Will get provider signature and fax back.

## 2017-05-25 ENCOUNTER — Other Ambulatory Visit: Payer: Self-pay | Admitting: Family Medicine

## 2017-05-25 ENCOUNTER — Ambulatory Visit: Payer: Self-pay

## 2017-05-25 DIAGNOSIS — N183 Chronic kidney disease, stage 3 (moderate): Secondary | ICD-10-CM | POA: Diagnosis not present

## 2017-05-25 DIAGNOSIS — F17210 Nicotine dependence, cigarettes, uncomplicated: Secondary | ICD-10-CM | POA: Diagnosis not present

## 2017-05-25 DIAGNOSIS — M1990 Unspecified osteoarthritis, unspecified site: Secondary | ICD-10-CM | POA: Diagnosis not present

## 2017-05-25 DIAGNOSIS — J439 Emphysema, unspecified: Secondary | ICD-10-CM | POA: Diagnosis not present

## 2017-05-25 DIAGNOSIS — K219 Gastro-esophageal reflux disease without esophagitis: Secondary | ICD-10-CM | POA: Diagnosis not present

## 2017-05-25 DIAGNOSIS — G4733 Obstructive sleep apnea (adult) (pediatric): Secondary | ICD-10-CM | POA: Diagnosis not present

## 2017-05-25 DIAGNOSIS — I2510 Atherosclerotic heart disease of native coronary artery without angina pectoris: Secondary | ICD-10-CM | POA: Diagnosis not present

## 2017-05-25 DIAGNOSIS — I129 Hypertensive chronic kidney disease with stage 1 through stage 4 chronic kidney disease, or unspecified chronic kidney disease: Secondary | ICD-10-CM | POA: Diagnosis not present

## 2017-05-25 DIAGNOSIS — F329 Major depressive disorder, single episode, unspecified: Secondary | ICD-10-CM | POA: Diagnosis not present

## 2017-05-25 NOTE — Telephone Encounter (Signed)
Order signed by Dr. Wynetta Emery and faxed to [place].

## 2017-05-25 NOTE — Telephone Encounter (Signed)
Received call form Antonietta Breach RN at [facility]. She is with patient and is calling to report pt's BP. Her BP taken on her right arm was 172/90 and BP taken on her left arm was 188/90 with a HR 104. BP was taken today at 1:40 pm with a manual cuff. Pt denies any weakness, numbness on either side of the body. Denies any dizziness or blurred vision or chest pain. Pt has a h/o HTN and takes hydralazine 25 mg taken daily. Pt denies missing any doses.  Pt admits to eating a lot of processed food and keeps a salt shaker beside her on a table. Pt disposition is to see physician within 24 hours. Ms. Lennox Grumbles stated that they will be visiting her again tomorrow to check her BP again. Care advice given and Ms. Lennox Grumbles stated that she is going to have a low Na diet discussion with her and discuss sx to report. Pt has a hospital f/u appt Monday with her PCP. If any Rx changes are made please call Antonietta Breach RN @ [phone]   Reason for Disposition . Systolic BP  >= 103 OR Diastolic >= 159  Additional Information . Commented on: All Negative - Home Care    Pt has ben eating hot dogs french fries and keeps bottle of salt next to her table  Answer Assessment - Initial Assessment Questions 1. BLOOD PRESSURE: "What is the blood pressure?" "Did you take at least two measurements 5 minutes apart?"    Today taken on her right arm 172/90; left arm 188/90 HR 104     Yesterday BP 1100: 180/90 and at   1250: 136/78 2. ONSET: "When did you take your blood pressure?"     Today was taken 1:40 bu [place] RN 3. HOW: "How did you obtain the blood pressure?" (e.g., visiting nurse, automatic home BP monitor)     Visiting nurse Manual 4. HISTORY: "Do you have a history of high blood pressure?"     yes 5. MEDICATIONS: "Are you taking any medications for blood pressure?" "Have you missed any doses recently?"   Yes   No doses missed. 6. OTHER SYMPTOMS: "Do you have any symptoms?" (e.g.,  headache, chest pain, blurred vision, difficulty breathing, weakness)     No HR 104 7. PREGNANCY: "Is there any chance you are pregnant?" "When was your last menstrual period?"     n/a  Protocols used: HIGH BLOOD PRESSURE-A-AH

## 2017-05-25 NOTE — Telephone Encounter (Signed)
My notes say Hydralazine is twice a day.  Increase to 4 times/day

## 2017-05-25 NOTE — Telephone Encounter (Signed)
Order signed by Malachy Mood and faxed back to [facility].

## 2017-05-25 NOTE — Telephone Encounter (Signed)
Called and spoke to [employer]. I let her know what Malachy Mood said about medication and she states that she is going to call the patient and let her know.

## 2017-05-26 ENCOUNTER — Other Ambulatory Visit: Payer: Self-pay

## 2017-05-26 ENCOUNTER — Inpatient Hospital Stay: Payer: Medicare HMO

## 2017-05-26 ENCOUNTER — Inpatient Hospital Stay
Admission: EM | Admit: 2017-05-26 | Discharge: 2017-05-30 | DRG: 190 | Disposition: A | Discharge status: Home Health | Payer: Medicare HMO | Attending: Internal Medicine | Admitting: Internal Medicine

## 2017-05-26 ENCOUNTER — Encounter: Payer: Self-pay | Admitting: Emergency Medicine

## 2017-05-26 ENCOUNTER — Emergency Department: Payer: Medicare HMO

## 2017-05-26 DIAGNOSIS — Z6839 Body mass index (BMI) 39.0-39.9, adult: Secondary | ICD-10-CM | POA: Diagnosis not present

## 2017-05-26 DIAGNOSIS — E559 Vitamin D deficiency, unspecified: Secondary | ICD-10-CM | POA: Diagnosis present

## 2017-05-26 DIAGNOSIS — Z8249 Family history of ischemic heart disease and other diseases of the circulatory system: Secondary | ICD-10-CM | POA: Diagnosis not present

## 2017-05-26 DIAGNOSIS — Z96651 Presence of right artificial knee joint: Secondary | ICD-10-CM | POA: Diagnosis present

## 2017-05-26 DIAGNOSIS — Z7982 Long term (current) use of aspirin: Secondary | ICD-10-CM

## 2017-05-26 DIAGNOSIS — E669 Obesity, unspecified: Secondary | ICD-10-CM | POA: Diagnosis present

## 2017-05-26 DIAGNOSIS — I4892 Unspecified atrial flutter: Secondary | ICD-10-CM | POA: Diagnosis not present

## 2017-05-26 DIAGNOSIS — J439 Emphysema, unspecified: Secondary | ICD-10-CM | POA: Diagnosis not present

## 2017-05-26 DIAGNOSIS — Z66 Do not resuscitate: Secondary | ICD-10-CM | POA: Diagnosis present

## 2017-05-26 DIAGNOSIS — N183 Chronic kidney disease, stage 3 (moderate): Secondary | ICD-10-CM | POA: Diagnosis not present

## 2017-05-26 DIAGNOSIS — Z9981 Dependence on supplemental oxygen: Secondary | ICD-10-CM

## 2017-05-26 DIAGNOSIS — R0602 Shortness of breath: Secondary | ICD-10-CM | POA: Diagnosis not present

## 2017-05-26 DIAGNOSIS — F329 Major depressive disorder, single episode, unspecified: Secondary | ICD-10-CM | POA: Diagnosis not present

## 2017-05-26 DIAGNOSIS — K219 Gastro-esophageal reflux disease without esophagitis: Secondary | ICD-10-CM | POA: Diagnosis not present

## 2017-05-26 DIAGNOSIS — I1310 Hypertensive heart and chronic kidney disease without heart failure, with stage 1 through stage 4 chronic kidney disease, or unspecified chronic kidney disease: Secondary | ICD-10-CM | POA: Diagnosis present

## 2017-05-26 DIAGNOSIS — J441 Chronic obstructive pulmonary disease with (acute) exacerbation: Principal | ICD-10-CM | POA: Diagnosis present

## 2017-05-26 DIAGNOSIS — Z7951 Long term (current) use of inhaled steroids: Secondary | ICD-10-CM

## 2017-05-26 DIAGNOSIS — I129 Hypertensive chronic kidney disease with stage 1 through stage 4 chronic kidney disease, or unspecified chronic kidney disease: Secondary | ICD-10-CM | POA: Diagnosis not present

## 2017-05-26 DIAGNOSIS — R791 Abnormal coagulation profile: Secondary | ICD-10-CM | POA: Diagnosis present

## 2017-05-26 DIAGNOSIS — G4733 Obstructive sleep apnea (adult) (pediatric): Secondary | ICD-10-CM | POA: Diagnosis not present

## 2017-05-26 DIAGNOSIS — M1990 Unspecified osteoarthritis, unspecified site: Secondary | ICD-10-CM | POA: Diagnosis not present

## 2017-05-26 DIAGNOSIS — J9621 Acute and chronic respiratory failure with hypoxia: Secondary | ICD-10-CM | POA: Diagnosis present

## 2017-05-26 DIAGNOSIS — F17210 Nicotine dependence, cigarettes, uncomplicated: Secondary | ICD-10-CM | POA: Diagnosis present

## 2017-05-26 DIAGNOSIS — E875 Hyperkalemia: Secondary | ICD-10-CM | POA: Diagnosis not present

## 2017-05-26 DIAGNOSIS — I2510 Atherosclerotic heart disease of native coronary artery without angina pectoris: Secondary | ICD-10-CM | POA: Diagnosis not present

## 2017-05-26 DIAGNOSIS — R0789 Other chest pain: Secondary | ICD-10-CM | POA: Diagnosis not present

## 2017-05-26 DIAGNOSIS — R0600 Dyspnea, unspecified: Secondary | ICD-10-CM

## 2017-05-26 DIAGNOSIS — Z79899 Other long term (current) drug therapy: Secondary | ICD-10-CM | POA: Diagnosis not present

## 2017-05-26 DIAGNOSIS — R079 Chest pain, unspecified: Secondary | ICD-10-CM | POA: Diagnosis not present

## 2017-05-26 DIAGNOSIS — I10 Essential (primary) hypertension: Secondary | ICD-10-CM | POA: Diagnosis not present

## 2017-05-26 LAB — COMPREHENSIVE METABOLIC PANEL
ALT: 18 U/L (ref 14–54)
AST: 24 U/L (ref 15–41)
Albumin: 3.3 g/dL — ABNORMAL LOW (ref 3.5–5.0)
Alkaline Phosphatase: 65 U/L (ref 38–126)
Anion gap: 6 (ref 5–15)
BILIRUBIN TOTAL: 0.8 mg/dL (ref 0.3–1.2)
BUN: 30 mg/dL — AB (ref 6–20)
CO2: 28 mmol/L (ref 22–32)
CREATININE: 1.77 mg/dL — AB (ref 0.44–1.00)
Calcium: 8.5 mg/dL — ABNORMAL LOW (ref 8.9–10.3)
Chloride: 102 mmol/L (ref 101–111)
GFR calc Af Amer: 31 mL/min — ABNORMAL LOW (ref >60)
GFR, EST NON AFRICAN AMERICAN: 27 mL/min — AB (ref >60)
Glucose, Bld: 103 mg/dL — ABNORMAL HIGH (ref 65–99)
POTASSIUM: 4.8 mmol/L (ref 3.5–5.1)
Sodium: 136 mmol/L (ref 135–145)
TOTAL PROTEIN: 6.6 g/dL (ref 6.5–8.1)

## 2017-05-26 LAB — CBC WITH DIFFERENTIAL/PLATELET
BASOS ABS: 0.2 10*3/uL — AB (ref 0–0.1)
Basophils Relative: 2 %
Eosinophils Absolute: 0 10*3/uL (ref 0–0.7)
Eosinophils Relative: 0 %
HCT: 41 % (ref 35.0–47.0)
Hemoglobin: 14.1 g/dL (ref 12.0–16.0)
LYMPHS ABS: 2.8 10*3/uL (ref 1.0–3.6)
LYMPHS PCT: 34 %
MCH: 32.1 pg (ref 26.0–34.0)
MCHC: 34.3 g/dL (ref 32.0–36.0)
MCV: 93.7 fL (ref 80.0–100.0)
MONO ABS: 0.8 10*3/uL (ref 0.2–0.9)
MONOS PCT: 9 %
NEUTROS ABS: 4.5 10*3/uL (ref 1.4–6.5)
Neutrophils Relative %: 55 %
Platelets: 235 10*3/uL (ref 150–440)
RBC: 4.38 MIL/uL (ref 3.80–5.20)
RDW: 15.4 % — AB (ref 11.5–14.5)
WBC: 8.2 10*3/uL (ref 3.6–11.0)

## 2017-05-26 LAB — BRAIN NATRIURETIC PEPTIDE: B Natriuretic Peptide: 160 pg/mL — ABNORMAL HIGH (ref 0.0–100.0)

## 2017-05-26 LAB — TROPONIN I: Troponin I: <0.03 ng/mL (ref <0.03)

## 2017-05-26 LAB — FIBRIN DERIVATIVES D-DIMER (ARMC ONLY): Fibrin derivatives D-dimer (ARMC): 1025.2 ng/mL (FEU) — ABNORMAL HIGH (ref 0.00–499.00)

## 2017-05-26 MED ORDER — METHYLPREDNISOLONE SODIUM SUCC 125 MG IJ SOLR
60.0000 mg | Freq: Two times a day (BID) | INTRAMUSCULAR | Status: DC
  Administered 2017-05-26 – 2017-05-28 (×4): 60 mg via INTRAVENOUS
  Filled 2017-05-26 (×4): qty 2

## 2017-05-26 MED ORDER — SODIUM CHLORIDE 0.9% FLUSH
3.0000 mL | Freq: Two times a day (BID) | INTRAVENOUS | Status: DC
  Administered 2017-05-27 – 2017-05-30 (×5): 3 mL via INTRAVENOUS

## 2017-05-26 MED ORDER — ENOXAPARIN SODIUM 40 MG/0.4ML ~~LOC~~ SOLN
40.0000 mg | SUBCUTANEOUS | Status: DC
  Administered 2017-05-26 – 2017-05-29 (×4): 40 mg via SUBCUTANEOUS
  Filled 2017-05-26 (×4): qty 0.4

## 2017-05-26 MED ORDER — GUAIFENESIN 100 MG/5ML PO SOLN
5.0000 mL | ORAL | Status: DC | PRN
Start: 2017-05-26 — End: 2017-05-30
  Administered 2017-05-29: 100 mg via ORAL
  Filled 2017-05-26 (×2): qty 5

## 2017-05-26 MED ORDER — IPRATROPIUM-ALBUTEROL 0.5-2.5 (3) MG/3ML IN SOLN
3.0000 mL | Freq: Once | RESPIRATORY_TRACT | Status: AC
  Administered 2017-05-26: 3 mL via RESPIRATORY_TRACT
  Filled 2017-05-26: qty 3

## 2017-05-26 MED ORDER — PANTOPRAZOLE SODIUM 40 MG PO TBEC
40.0000 mg | DELAYED_RELEASE_TABLET | Freq: Every day | ORAL | Status: DC
  Administered 2017-05-27 – 2017-05-30 (×4): 40 mg via ORAL
  Filled 2017-05-26 (×4): qty 1

## 2017-05-26 MED ORDER — FLUTICASONE FUROATE-VILANTEROL 100-25 MCG/INH IN AEPB
1.0000 | INHALATION_SPRAY | Freq: Every day | RESPIRATORY_TRACT | Status: DC
  Filled 2017-05-26: qty 28

## 2017-05-26 MED ORDER — ONDANSETRON HCL 4 MG PO TABS: 4.0000 mg | ORAL_TABLET | Freq: Four times a day (QID) | ORAL | Status: DC | PRN

## 2017-05-26 MED ORDER — MORPHINE SULFATE (PF) 2 MG/ML IV SOLN
2.0000 mg | Freq: Once | INTRAVENOUS | Status: AC
  Administered 2017-05-26: 2 mg via INTRAVENOUS

## 2017-05-26 MED ORDER — IPRATROPIUM-ALBUTEROL 0.5-2.5 (3) MG/3ML IN SOLN
RESPIRATORY_TRACT | Status: AC
  Administered 2017-05-26: 3 mL via RESPIRATORY_TRACT
  Filled 2017-05-26: qty 6

## 2017-05-26 MED ORDER — MORPHINE SULFATE (PF) 2 MG/ML IV SOLN
2.0000 mg | INTRAVENOUS | Status: DC | PRN
  Administered 2017-05-26 – 2017-05-27 (×2): 2 mg via INTRAVENOUS
  Filled 2017-05-26: qty 1

## 2017-05-26 MED ORDER — POLYETHYLENE GLYCOL 3350 17 G PO PACK: 17.0000 g | PACK | Freq: Every day | ORAL | Status: DC | PRN

## 2017-05-26 MED ORDER — LORATADINE 10 MG PO TABS
10.0000 mg | ORAL_TABLET | Freq: Every day | ORAL | Status: DC
  Administered 2017-05-27 – 2017-05-30 (×4): 10 mg via ORAL
  Filled 2017-05-26 (×4): qty 1

## 2017-05-26 MED ORDER — SODIUM CHLORIDE 0.9% FLUSH
3.0000 mL | Freq: Two times a day (BID) | INTRAVENOUS | Status: DC
  Administered 2017-05-26 – 2017-05-30 (×6): 3 mL via INTRAVENOUS

## 2017-05-26 MED ORDER — ASPIRIN EC 81 MG PO TBEC
81.0000 mg | DELAYED_RELEASE_TABLET | Freq: Every day | ORAL | Status: DC
  Administered 2017-05-27 – 2017-05-30 (×4): 81 mg via ORAL
  Filled 2017-05-26 (×4): qty 1

## 2017-05-26 MED ORDER — FLUOXETINE HCL 10 MG PO CAPS
10.0000 mg | ORAL_CAPSULE | Freq: Every day | ORAL | Status: DC
  Administered 2017-05-27 – 2017-05-29 (×3): 10 mg via ORAL
  Filled 2017-05-26 (×3): qty 1

## 2017-05-26 MED ORDER — HYDRALAZINE HCL 25 MG PO TABS
25.0000 mg | ORAL_TABLET | Freq: Three times a day (TID) | ORAL | Status: DC
  Administered 2017-05-26 – 2017-05-30 (×11): 25 mg via ORAL
  Filled 2017-05-26 (×11): qty 1

## 2017-05-26 MED ORDER — MORPHINE SULFATE (PF) 2 MG/ML IV SOLN
INTRAVENOUS | Status: AC
  Administered 2017-05-26: 2 mg via INTRAVENOUS
  Filled 2017-05-26: qty 1

## 2017-05-26 MED ORDER — TIOTROPIUM BROMIDE MONOHYDRATE 18 MCG IN CAPS
18.0000 ug | ORAL_CAPSULE | Freq: Every day | RESPIRATORY_TRACT | Status: DC
  Administered 2017-05-27 – 2017-05-30 (×4): 18 ug via RESPIRATORY_TRACT
  Filled 2017-05-26: qty 5

## 2017-05-26 MED ORDER — IPRATROPIUM-ALBUTEROL 0.5-2.5 (3) MG/3ML IN SOLN
3.0000 mL | Freq: Four times a day (QID) | RESPIRATORY_TRACT | Status: DC
  Administered 2017-05-27 (×4): 3 mL via RESPIRATORY_TRACT
  Filled 2017-05-26 (×5): qty 3

## 2017-05-26 MED ORDER — ONDANSETRON HCL 4 MG/2ML IJ SOLN: 4.0000 mg | Freq: Four times a day (QID) | INTRAMUSCULAR | Status: DC | PRN

## 2017-05-26 MED ORDER — MORPHINE SULFATE (PF) 4 MG/ML IV SOLN
4.0000 mg | Freq: Once | INTRAVENOUS | Status: AC
Start: 2017-05-26 — End: 2017-05-26
  Administered 2017-05-26: 4 mg via INTRAVENOUS
  Filled 2017-05-26 (×2): qty 1

## 2017-05-26 MED ORDER — TECHNETIUM TC 99M DIETHYLENETRIAME-PENTAACETIC ACID
27.9200 | Freq: Once | INTRAVENOUS | Status: AC | PRN
  Administered 2017-05-26: 27.92 via RESPIRATORY_TRACT

## 2017-05-26 MED ORDER — SODIUM CHLORIDE 0.9% FLUSH: 3.0000 mL | INTRAVENOUS | Status: DC | PRN

## 2017-05-26 MED ORDER — ACETAMINOPHEN 650 MG RE SUPP: 650.0000 mg | Freq: Four times a day (QID) | RECTAL | Status: DC | PRN

## 2017-05-26 MED ORDER — IBUPROFEN 600 MG PO TABS: 600.0000 mg | ORAL_TABLET | Freq: Once | ORAL | Status: DC

## 2017-05-26 MED ORDER — HYDRALAZINE HCL 20 MG/ML IJ SOLN: 10.0000 mg | Freq: Four times a day (QID) | INTRAMUSCULAR | Status: DC | PRN

## 2017-05-26 MED ORDER — SODIUM CHLORIDE 0.9 % IV SOLN: 250.0000 mL | INTRAVENOUS | Status: DC | PRN

## 2017-05-26 MED ORDER — TECHNETIUM TO 99M ALBUMIN AGGREGATED
3.9500 | Freq: Once | INTRAVENOUS | Status: AC | PRN
  Administered 2017-05-26: 3.95 via INTRAVENOUS

## 2017-05-26 MED ORDER — HYDROCODONE-ACETAMINOPHEN 5-325 MG PO TABS: 1.0000 | ORAL_TABLET | Freq: Once | ORAL | Status: DC

## 2017-05-26 MED ORDER — ACETAMINOPHEN 325 MG PO TABS: 650.0000 mg | ORAL_TABLET | Freq: Four times a day (QID) | ORAL | Status: DC | PRN

## 2017-05-26 MED ORDER — OXYCODONE HCL 5 MG PO TABS
5.0000 mg | ORAL_TABLET | ORAL | Status: DC | PRN
  Administered 2017-05-26 – 2017-05-28 (×2): 5 mg via ORAL
  Filled 2017-05-26 (×2): qty 1

## 2017-05-26 NOTE — ED Notes (Signed)
Pt sitting in bed and calling out in pain. Pt clutching chest and reporting increased chest pain shooting into her back. Pt has been sat upright but continues to have intermittent sharp chest pains.

## 2017-05-26 NOTE — H&P (Signed)
[facility] [place] NAME: Katherine [name]    MR#:  [id number]  DATE OF BIRTH:  08-31-1940  DATE OF ADMISSION:  05/26/2017  PRIMARY CARE PHYSICIAN: [name], NP   REQUESTING/REFERRING PHYSICIAN: Dr. Cinda Quest  CHIEF COMPLAINT:   Chief Complaint  Patient presents with  . Chest Pain  . Shortness of Breath    HISTORY OF PRESENT ILLNESS:  Katherine [name]  is a 77 y.o. female with a known history of CAD, hypertension, COPD on 2 L oxygen at home with recurrent admissions for COPD exacerbation presents to the hospital complaining of worsening shortness of breath and central pleuritic chest pain which is worse with taking deep breath.  Here in the emergency room patient has been placed on BiPAP.  Troponin normal.  EKG shows nothing acute.  Chest x-ray with no infiltrates.  Patient is being admitted for COPD exacerbation.  Due to her pleuritic chest pain a d-dimer has been checked which is elevated.  A CTA cannot be done due to CKD.  VQ scan ordered.  PAST MEDICAL HISTORY:   Past Medical History:  Diagnosis Date  . Angioedema   . Anxiety   . Anxiety and depression   . Chronic constipation   . Chronic kidney disease    stage 3  . COPD (chronic obstructive pulmonary disease) ([place])   . Gallstones   . GERD (gastroesophageal reflux disease)   . Hypertension   . Left ventricular hypertrophy   . Osteoarthritis   . Prurigo nodularis   . Tobacco abuse   . Vitamin D deficiency disease     PAST SURGICAL HISTORY:   Past Surgical History:  Procedure Laterality Date  . CHOLECYSTECTOMY    . 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   Procedure: TOTAL KNEE ARTHROPLASTY;  Surgeon: Hessie Knows, MD;  Location: [facility] ORS;  Service: Orthopedics;  Laterality: Right;    SOCIAL HISTORY:   Social History   Tobacco Use  . Smoking status: Current Every Day Smoker    Packs/day: 0.50    Years: 60.00    Pack years:  30.00    Types: Cigarettes  . Smokeless tobacco: Never Used  Substance Use Topics  . Alcohol use: No    Alcohol/week: 0.0 oz    Comment: rare    FAMILY HISTORY:   Family History  Problem Relation Age of Onset  . Alcohol abuse Mother   . Sickle cell anemia Daughter   . Hypertension Son   . Cancer Neg Hx   . COPD Neg Hx   . Diabetes Neg Hx   . Heart disease Neg Hx   . Stroke Neg Hx     DRUG ALLERGIES:   Allergies  Allergen Reactions  . Bee Venom Swelling  . Enalapril Maleate Swelling  . Other     Bee stings    REVIEW OF SYSTEMS:   Review of Systems  Constitutional: Positive for malaise/fatigue. Negative for chills, fever and weight loss.  HENT: Negative for hearing loss and nosebleeds.   Eyes: Negative for blurred vision, double vision and pain.  Respiratory: Negative for cough, hemoptysis, sputum production, shortness of breath and wheezing.   Cardiovascular: Negative for chest pain, palpitations, orthopnea and leg swelling.  Gastrointestinal: Negative for abdominal pain, constipation, diarrhea, nausea and vomiting.  Genitourinary: Negative for dysuria and hematuria.  Musculoskeletal: Negative for back pain, falls and myalgias.  Skin: Negative for rash.  Neurological: Negative for  dizziness, tremors, sensory change, speech change, focal weakness, seizures and headaches.  Endo/Heme/Allergies: Does not bruise/bleed easily.  Psychiatric/Behavioral: Negative for depression and memory loss. The patient is not nervous/anxious.     MEDICATIONS AT HOME:   Prior to Admission medications   Medication Sig Start Date End Date Taking? Authorizing Provider  acetaminophen (TYLENOL) 650 MG CR tablet Take 650-1,300 mg by mouth every 8 (eight) hours as needed for pain.    Yes [provider]  aspirin EC 81 MG tablet Take 81 mg by mouth daily.   Yes [provider]  cetirizine (ZYRTEC) 10 MG tablet TAKE 1/2 TABLET(5 MG) BY MOUTH AT BEDTIME 05/25/17  Yes [name], NP  cholecalciferol (VITAMIN D) 1000 units tablet Take 1,000 Units by mouth daily.   Yes [provider]  FLUoxetine (PROZAC) 10 MG capsule TAKE 1 CAPSULE(10 MG) BY MOUTH DAILY 01/30/17  Yes [name], NP  fluticasone furoate-vilanterol (BREO ELLIPTA) 100-25 MCG/INH AEPB Inhale 1 puff into the lungs daily. 04/27/16  Yes [name], MD  guaiFENesin (ROBITUSSIN) 100 MG/5ML SOLN Take 5 mLs (100 mg total) by mouth every 4 (four) hours as needed for cough or to loosen phlegm. 05/21/17  Yes Demetrios Loll, MD  hydrALAZINE (APRESOLINE) 25 MG tablet TAKE 1 TABLET(25 MG) BY MOUTH TWICE DAILY 04/26/17  Yes Volney American, PA-C  ipratropium-albuterol (DUONEB) 0.5-2.5 (3) MG/3ML SOLN Take 3 mLs by nebulization every 6 (six) hours as needed. Patient taking differently: Take 3 mLs by nebulization every 6 (six) hours as needed (shortness of breath and/or wheezing).  11/29/15  Yes [name], MD  omeprazole (PRILOSEC) 40 MG capsule Take 40 mg by mouth daily.   Yes [provider]  SPIRIVA HANDIHALER 18 MCG inhalation capsule PLACE 1 CAPSULE INTO INHALER AND INHALE EVERY DAY 04/25/17  Yes Volney American, PA-C  albuterol (PROVENTIL HFA) 108 (90 Base) MCG/ACT inhaler Inhale 2 puffs into the lungs every 4 (four) hours as needed for wheezing or shortness of breath. Patient not taking: Reported on 05/26/2017 04/25/17   Volney American, PA-C  meclizine (ANTIVERT) 25 MG tablet TAKE 1 TABLET(25 MG) BY MOUTH THREE TIMES DAILY AS NEEDED FOR DIZZINESS Patient not taking: Reported on 05/26/2017 10/27/16   Guadalupe Maple, MD  predniSONE (DELTASONE) 10 MG tablet 40 mg po daily for 1 day, 30 mg po daily for 1 day, 20 mg po daily for 1 day, 10 mg po daily for 1 day. Patient not taking: Reported on 05/26/2017 05/21/17   Demetrios Loll, MD     VITAL SIGNS:  Blood pressure (!) 136/92, pulse 95, temperature 98.1 F (36.7 C), temperature source Oral, resp. rate (!) 28, height 5\' 7"  (1.702  m), weight 113.4 kg (250 lb), SpO2 97 %.  PHYSICAL EXAMINATION:  Physical Exam  GENERAL:  77 y.o.-year-old patient lying in the bed with  mild respiratory distress.  Obese EYES: Pupils equal, round, reactive to light and accommodation. No scleral icterus. Extraocular muscles intact.  HEENT: Head atraumatic, normocephalic. Oropharynx and nasopharynx clear. No oropharyngeal erythema, moist oral mucosa  NECK:  Supple, no jugular venous distention. No thyroid enlargement, no tenderness.  LUNGS: Decreased air entry bilaterally with wheezing CARDIOVASCULAR: S1, S2 normal. No murmurs, rubs, or gallops.  ABDOMEN: Soft, nontender, nondistended. Bowel sounds present. No organomegaly or mass.  EXTREMITIES: No pedal edema, cyanosis, or clubbing. + 2 pedal & radial pulses b/l.   NEUROLOGIC: Cranial nerves II through XII are intact. No focal Motor or sensory  deficits appreciated b/l PSYCHIATRIC: The patient is alert and oriented x 3. Good affect.  SKIN: No obvious rash, lesion, or ulcer.   LABORATORY PANEL:   CBC Recent Labs  Lab 05/26/17 1444  WBC 8.2  HGB 14.1  HCT 41.0  PLT 235   ------------------------------------------------------------------------------------------------------------------  Chemistries  Recent Labs  Lab 05/26/17 1444  NA 136  K 4.8  CL 102  CO2 28  GLUCOSE 103*  BUN 30*  CREATININE 1.77*  CALCIUM 8.5*  AST 24  ALT 18  ALKPHOS 65  BILITOT 0.8   ------------------------------------------------------------------------------------------------------------------  Cardiac Enzymes Recent Labs  Lab 05/26/17 1444  TROPONINI <0.03   ------------------------------------------------------------------------------------------------------------------  RADIOLOGY:  Dg Chest Port 1 View  Result Date: 05/26/2017 CLINICAL DATA:  Chest pain and shortness of breath. EXAM: PORTABLE CHEST 1 VIEW COMPARISON:  Chest x-rays dated 05/16/2017, 04/05/2017 and 11/28/2015.  FINDINGS: Stable mild cardiomegaly. Aortic atherosclerosis. Lungs are clear. No pleural effusion or pneumothorax seen. No acute or suspicious osseous finding. IMPRESSION: 1. Stable chest x-ray. No active disease. No evidence of pneumonia or pulmonary edema. 2. Mild cardiomegaly. 3. Aortic atherosclerosis. Electronically Signed   By: Franki Cabot M.D.   On: 05/26/2017 15:16     IMPRESSION AND PLAN:   * COPD exacerbation with acute on on chronic hypoxic respiratory failure -IV steroids, Antibiotics - Scheduled Nebulizers - Inhalers -Wean O2 as tolerated - Consult pulmonary if no improvement  * Chest pain with elevated  D-dimer VQ scan ordered Likely musculoskeletal  *Hypertension Continue home medications.  IV hydralazine added.  *CKD stage III.  Stable.  Creatinine improved from last admission.  *DVT prophylaxis with Lovenox   All the records are reviewed and case discussed with ED provider. Management plans discussed with the patient, family and they are in agreement.  CODE STATUS: DNR  TOTAL TIME TAKING CARE OF THIS PATIENT: 40 minutes.   Neita Carp M.D on 05/26/2017 at 5:43 PM  Between 7am to 6pm - Pager - [phone]  After 6pm go to [email or web address] - password [email or web address] [facility]  Office  [phone]  CC: Primary care physician; [name], NP  Note: This dictation was prepared with Dragon dictation along with smaller phrase technology. Any transcriptional errors that result from this process are unintentional.

## 2017-05-26 NOTE — ED Provider Notes (Signed)
patient taken in signout from Dr. Kerman Passey. a good see the patient she is lying in bed comfortable in a BiPAP that she still having a little bit of chest pain. Troponin is pending EKG was reviewed shows no signs of ischemia. Chest x-ray has been done in the diaphragm looks a little bit more hazy than previous chest x-ray and other labs are pending. Patient's having some wheezing still. Sitting up in bed for about 2 minutes makes her extremely short of breath. O2 sats did not change on the BiPAP. We'll give her some more DuoNeb's and wait for lab work  after [employer] patient continues to be very short of breath when she sits up. D-dimer is very positive. Her creatinine however is too high to do a CT Angie will have to do a VQ scan in the meantime we'll admit her for COPD exacerbation.   Nena Polio, MD 05/26/17 2352

## 2017-05-26 NOTE — ED Provider Notes (Signed)
[facility] Emergency Department Provider Note  Time seen: 2:39 PM  I have reviewed the triage vital signs and the nursing notes.   HISTORY  Chief Complaint Chest Pain and Shortness of Breath    HPI Katherine [name] is a 77 y.o. female with a past medical history of anxiety, CKD, COPD, presents to the emergency department for shortness of breath and chest pain.  According to the patient approximately 2-3 hours ago she developed shortness of breath and central chest pain.  EMS stated upon arrival patient is in significant respiratory distress, able to speak in 1-2 word sentences.  They gave the patient 125 mg of Solu-Medrol and 2 duo nebs.  Upon arrival to the emergency department patient is sitting upright in the stretcher continues to have mild respiratory distress with grunting.  Able to speak in 2-3 word sentences.  Continues to appear extremely short of breath.  States only very mild chest tightness/discomfort.  Patient denies any recent fever, cough, congestion, abdominal pain, vomiting, largely negative review of systems.   Past Medical History:  Diagnosis Date  . Angioedema   . Anxiety   . Anxiety and depression   . Chronic constipation   . Chronic kidney disease    stage 3  . COPD (chronic obstructive pulmonary disease) ([place])   . Gallstones   . GERD (gastroesophageal reflux disease)   . Hypertension   . Left ventricular hypertrophy   . Osteoarthritis   . Prurigo nodularis   . Tobacco abuse   . Vitamin D deficiency disease     Patient Active Problem List   Diagnosis Date Noted  . COPD exacerbation ([place]) 04/20/2017  . COPD with exacerbation ([place]) 04/05/2017  . Hand pain 03/21/2017  . Dry skin 03/21/2017  . Pain in finger of left hand 09/12/2016  . Chronic fatigue 06/12/2016  . Left knee pain 06/12/2016  . Advanced care planning/counseling discussion 03/13/2016  . DNR (do not resuscitate) 03/13/2016  . Primary localized osteoarthritis of right  knee 02/08/2016  . OSA and COPD overlap syndrome ([place]) 09/16/2015  . Rotator cuff syndrome 09/07/2015  . 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 Sleep disturbance 04/14/2015  . Coronary artery disease 03/14/2015  . 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 Anxiety   . Tobacco abuse   . Prurigo nodularis   . Hypertensive heart disease   . GERD (gastroesophageal reflux disease)   . COPD (chronic obstructive pulmonary disease) ([place])   . Osteoarthritis   . Vitamin D deficiency disease   . Chronic constipation   . CKD (chronic kidney disease), stage III ([place])   . LVH (left ventricular hypertrophy) 05/20/2013  . Essential hypertension 05/20/2013  . Morbid obesity ([place]) 05/20/2013    Past Surgical History:  Procedure Laterality Date  . CHOLECYSTECTOMY    . 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   Procedure: TOTAL KNEE ARTHROPLASTY;  Surgeon: Hessie Knows, MD;  Location: [facility] ORS;  Service: Orthopedics;  Laterality: Right;    Prior to Admission medications   Medication Sig Start Date End Date Taking? Authorizing Provider  acetaminophen (TYLENOL) 650 MG CR tablet Take 650-1,300 mg by mouth every 8 (eight) hours as needed for pain.     [provider]  albuterol (PROVENTIL HFA) 108 (90 Base) MCG/ACT inhaler Inhale 2 puffs into the lungs every 4 (four) hours as needed for wheezing or shortness of breath. 04/25/17   Volney American,  PA-C  aspirin EC 81 MG tablet Take 81 mg by mouth daily.    [provider]  cetirizine (ZYRTEC) 10 MG tablet TAKE 1/2 TABLET(5 MG) BY MOUTH AT BEDTIME 05/25/17   [name], NP  cholecalciferol (VITAMIN D) 1000 units tablet Take 1,000 Units by mouth daily.    [provider]  FLUoxetine (PROZAC) 10 MG capsule TAKE 1 CAPSULE(10 MG) BY MOUTH DAILY 01/30/17   [name], NP  fluticasone furoate-vilanterol (BREO ELLIPTA) 100-25 MCG/INH AEPB Inhale 1 puff into  the lungs daily. 04/27/16   [name], MD  guaiFENesin (ROBITUSSIN) 100 MG/5ML SOLN Take 5 mLs (100 mg total) by mouth every 4 (four) hours as needed for cough or to loosen phlegm. 05/21/17   Demetrios Loll, MD  hydrALAZINE (APRESOLINE) 25 MG tablet TAKE 1 TABLET(25 MG) BY MOUTH TWICE DAILY 04/26/17   Volney American, PA-C  ipratropium-albuterol (DUONEB) 0.5-2.5 (3) MG/3ML SOLN Take 3 mLs by nebulization every 6 (six) hours as needed. 11/29/15   [name], MD  meclizine (ANTIVERT) 25 MG tablet TAKE 1 TABLET(25 MG) BY MOUTH THREE TIMES DAILY AS NEEDED FOR DIZZINESS 10/27/16   Guadalupe Maple, MD  omeprazole (PRILOSEC) 40 MG capsule Take 40 mg by mouth daily.    [provider]  predniSONE (DELTASONE) 10 MG tablet 40 mg po daily for 1 day, 30 mg po daily for 1 day, 20 mg po daily for 1 day, 10 mg po daily for 1 day. 05/21/17   Demetrios Loll, MD  [facility] HANDIHALER 18 MCG inhalation capsule PLACE 1 CAPSULE INTO INHALER AND INHALE EVERY DAY 04/25/17   Volney American, PA-C    Allergies  Allergen Reactions  . Bee Venom Swelling  . Enalapril Maleate Swelling  . Other     Bee stings    Family History  Problem Relation Age of Onset  . Alcohol abuse Mother   . Sickle cell anemia Daughter   . Hypertension Son   . Cancer Neg Hx   . COPD Neg Hx   . Diabetes Neg Hx   . Heart disease Neg Hx   . Stroke Neg Hx     Social History Social History   Tobacco Use  . Smoking status: Current Every Day Smoker    Packs/day: 0.50    Years: 60.00    Pack years: 30.00    Types: Cigarettes  . Smokeless tobacco: Never Used  Substance Use Topics  . Alcohol use: No    Alcohol/week: 0.0 oz    Comment: rare  . Drug use: No    Review of Systems Constitutional: Negative for fever. Eyes: Negative for visual complaints ENT: Negative for recent illness/congestion Cardiovascular: Positive for chest pain/tightness beginning 2 or 3 hours ago Respiratory: Positive for shortness of breath  beginning 2-3 hours ago.  Patient wears 2 L of oxygen 24/7 at baseline.  Denies cough. Gastrointestinal: Negative for abdominal pain, vomiting and diarrhea. Genitourinary: Negative for urinary compaints Musculoskeletal: Negative for increased leg swelling.  Denies leg pain. Skin: Negative for skin complaints  Neurological: Negative for headache All other ROS negative  ____________________________________________   PHYSICAL EXAM:  Constitutional: Alert and oriented, sitting upright in bed, mild to moderate distress.  Speaking in 2-3 word short sentences, grunting with each inspiration. Eyes: Normal exam ENT   Head: Normocephalic and atraumatic   Mouth/Throat: Mucous membranes are moist. Cardiovascular: Regular rhythm, rate around 100 bpm no obvious murmur. Respiratory: Moderate tachypnea, speaking in very short sentences, mild to  moderate respiratory distress, sitting upright in bed with grunting.  Difficult lung auscultation appears to have decreased lung sounds bilaterally no obvious significant wheeze rales or rhonchi. Gastrointestinal: Soft and nontender. No distention. Musculoskeletal: Nontender with normal range of motion in all extremities.  Slight lower extremity edema bilaterally.  Nontender to palpation. Neurologic:  Normal speech and language. No gross focal neurologic deficits  Skin:  Skin is warm, dry and intact.  Psychiatric: Mood and affect are normal.   ____________________________________________    EKG  EKG reviewed and interpreted by myself shows sinus tachycardia at 100 bpm, narrow QRS, normal axis, normal intervals, no concerning ST changes.  ____________________________________________    RDEYCXKGY  CXR pending  ____________________________________________   INITIAL IMPRESSION / ASSESSMENT AND PLAN / ED COURSE  Pertinent labs & imaging results that were available during my care of the patient were reviewed by me and considered in my medical  decision making (see chart for details).  Department for acute onset of shortness of breath and chest discomfort beginning 2-3 hours ago.  Differential would include COPD, CHF, ACS, pneumonia, pneumothorax.  We will check labs, portable chest x-ray.  We will place the patient on BiPAP and continue with 2 additional duo nebs, for suspected likely COPD exacerbation.   VBG hardly negative.  CBC nonrevealing.  The remainder the labs as well as chest x-ray are pending.  I signed the patient out to Dr. Rip Harbour.      CRITICAL CARE Performed by: Harvest Dark   Total critical care time: 30 minutes  Critical care time was exclusive of separately billable procedures and treating other patients.  Critical care was necessary to treat or prevent imminent or life-threatening deterioration.  Critical care was time spent personally by me on the following activities: development of treatment plan with patient and/or surrogate as well as nursing, discussions with consultants, evaluation of patient's response to treatment, examination of patient, obtaining history from patient or surrogate, ordering and performing treatments and interventions, ordering and review of laboratory studies, ordering and review of radiographic studies, pulse oximetry and re-evaluation of patient's condition.   ____________________________________________   FINAL CLINICAL IMPRESSION(S) / ED DIAGNOSES  COPD exacerbation    Harvest Dark, MD 05/26/17 1511

## 2017-05-26 NOTE — ED Triage Notes (Signed)
Pt arrived via EMS from home with reports of chest pain and shortness of breath. Pt states she started having shortness of breath about 1-2 hours ago.  Pt also c/o chest pain, states improved on arrival to ED.  Pt given 2 Duonebs with EMS and 125mg  of Solumedrol as well.     Pt having SOB on arrival with accessory muscle use and purse-lip breathing.  Pt unable to speak in short sentences without running out of breath.

## 2017-05-26 NOTE — ED Notes (Signed)
Pt taken off BiPAP per MD order. Pt eating at this time and call bell in reach. Pt verbalized understanding to press call bell if she feels SOB or feels as though she has increased WOB.

## 2017-05-26 NOTE — Progress Notes (Addendum)
Purpose of Encounter Discussion regarding COPD and recurrent admissions.  CODE STATUS discussion  Parties in Attendance Patient, husband and son.  Husband is the healthcare power of attorney.  Patients Decisional capacity Patient is alert and oriented.  Able to make decisions.  Patient has had recurrent admissions for COPD exacerbation.  She has stop smoking at this time but is discouraged that she is having these frequent flareups.  Her main concern at this time is her pain in her chest. We discussed regarding COPD worsening with time.  At this time patient is being admitted to the hospital for acute exacerbation management.  Patient has already been seen by palliative care during prior admission.  We discussed regarding CODE STATUS and patient does not want to be intubated or have CPR.  She also does not want a PEG tube. Husband at bedside is aware of her decisions.  Goals of care determination Admit to hospital.  Poor long-term prognosis.  Expected to have recurrent admissions with COPD.  Patient understands.  DO NOT RESUSCITATE and DO NOT INTUBATE.  Patient does not want a PEG tube.  Time spent - 18 minutes

## 2017-05-26 NOTE — ED Notes (Signed)
Dr. Cinda Quest at bedside and RN confirmed a verbal order for 4 more mg Morphine.

## 2017-05-26 NOTE — ED Notes (Signed)
Pt placed on Bipap not long after arrival to ED, pt reports feeling better after having Bipap in place. Pt denies any chest pain at this time. Pt states when the pain came, it was in the middle towards the right chest and radiating to the back.  Pt is alert and oriented x4.

## 2017-05-26 NOTE — ED Notes (Signed)
RN and MD at bedside with family and pt to update on treatment plan. Pt reports pain in center of chest. WOB has improved and SOB has improved.

## 2017-05-26 NOTE — ED Notes (Signed)
RN called respiratory to start duneb treatments on BiPAP.

## 2017-05-27 LAB — INFLUENZA PANEL BY PCR (TYPE A & B)
Influenza A By PCR: NEGATIVE
Influenza B By PCR: NEGATIVE

## 2017-05-27 LAB — BASIC METABOLIC PANEL
ANION GAP: 7 (ref 5–15)
BUN: 35 mg/dL — ABNORMAL HIGH (ref 6–20)
CALCIUM: 8.5 mg/dL — AB (ref 8.9–10.3)
CHLORIDE: 104 mmol/L (ref 101–111)
CO2: 25 mmol/L (ref 22–32)
Creatinine, Ser: 1.85 mg/dL — ABNORMAL HIGH (ref 0.44–1.00)
GFR calc non Af Amer: 26 mL/min — ABNORMAL LOW (ref >60)
GFR, EST AFRICAN AMERICAN: 30 mL/min — AB (ref >60)
Glucose, Bld: 186 mg/dL — ABNORMAL HIGH (ref 65–99)
Potassium: 5.6 mmol/L — ABNORMAL HIGH (ref 3.5–5.1)
SODIUM: 136 mmol/L (ref 135–145)

## 2017-05-27 LAB — TROPONIN I: Troponin I: <0.03 ng/mL (ref <0.03)

## 2017-05-27 MED ORDER — SODIUM POLYSTYRENE SULFONATE 15 GM/60ML PO SUSP
30.0000 g | Freq: Once | ORAL | Status: AC
  Administered 2017-05-27: 30 g via ORAL
  Filled 2017-05-27: qty 120

## 2017-05-27 MED ORDER — IPRATROPIUM-ALBUTEROL 0.5-2.5 (3) MG/3ML IN SOLN: 3.0000 mL | Freq: Three times a day (TID) | RESPIRATORY_TRACT | Status: DC

## 2017-05-27 MED ORDER — SODIUM CHLORIDE 0.9 % IV SOLN
1.0000 g | Freq: Once | INTRAVENOUS | Status: AC
  Administered 2017-05-27: 1 g via INTRAVENOUS
  Filled 2017-05-27: qty 10

## 2017-05-27 MED ORDER — BUDESONIDE 0.5 MG/2ML IN SUSP
0.5000 mg | Freq: Two times a day (BID) | RESPIRATORY_TRACT | Status: DC
  Administered 2017-05-27 – 2017-05-30 (×7): 0.5 mg via RESPIRATORY_TRACT
  Filled 2017-05-27 (×6): qty 2

## 2017-05-27 NOTE — Progress Notes (Addendum)
Patient ID: Katherine [name], female   DOB: 1941/02/05, 77 y.o.   MRN: [id number]  [employer] PROGRESS NOTE  Katherine [name] XBL:[id number] DOB: 1940-10-24 DOA: 05/26/2017 PCP: [name], NP  HPI/Subjective: Patient feeling little bit better than when she came in.   patient having shortness of breath and some cough.  Normally wears 3 L of oxygen.  Having some wheezing.  Normally walks with a cane or walker.  Objective: Vitals:   05/27/17 0352 05/27/17 0743  BP: (!) 156/75 (!) 147/87  Pulse: 71 85  Resp: 18 18  Temp: 98.1 F (36.7 C) 98 F (36.7 C)  SpO2: 97% 97%    Filed Weights   05/26/17 1447 05/27/17 0350  Weight: 113.4 kg (250 lb) 114.3 kg (252 lb)    ROS: Review of Systems  Constitutional: Negative for chills and fever.  Eyes: Negative for blurred vision.  Respiratory: Positive for cough, shortness of breath and wheezing.   Cardiovascular: Negative for chest pain.  Gastrointestinal: Negative for abdominal pain, constipation, diarrhea, nausea and vomiting.  Genitourinary: Negative for dysuria.  Musculoskeletal: Negative for joint pain.  Neurological: Negative for dizziness and headaches.   Exam: Physical Exam  Constitutional: She is oriented to person, place, and time.  HENT:  Nose: No mucosal edema.  Mouth/Throat: No oropharyngeal exudate or posterior oropharyngeal edema.  Eyes: Pupils are equal, round, and reactive to light. Conjunctivae, EOM and lids are normal.  Neck: No JVD present. Carotid bruit is not present. No edema present. No thyroid mass and no thyromegaly present.  Cardiovascular: S1 normal and S2 normal. Exam reveals no gallop.  No murmur heard. Pulses:      Dorsalis pedis pulses are 2+ on the right side, and 2+ on the left side.  Respiratory: No respiratory distress. She has decreased breath sounds in the right lower field and the left lower field. She has wheezes in the right middle field, the right lower field, the left middle field and the  left lower field. She has no rhonchi. She has no rales.  GI: Soft. Bowel sounds are normal. There is no tenderness.  Musculoskeletal:       Right ankle: She exhibits swelling.       Left ankle: She exhibits swelling.  Lymphadenopathy:    She has no cervical adenopathy.  Neurological: She is alert and oriented to person, place, and time. No cranial nerve deficit.  Skin: Skin is warm. No rash noted. Nails show no clubbing.  Psychiatric: She has a normal mood and affect.      Data Reviewed: Basic Metabolic Panel: Recent Labs  Lab 05/21/17 0434 05/26/17 1444 05/27/17 0138  NA 140 136 136  K 5.0 4.8 5.6*  CL 110 102 104  CO2 26 28 25   GLUCOSE 103* 103* 186*  BUN 47* 30* 35*  CREATININE 2.09* 1.77* 1.85*  CALCIUM 8.7* 8.5* 8.5*   Liver Function Tests: Recent Labs  Lab 05/26/17 1444  AST 24  ALT 18  ALKPHOS 65  BILITOT 0.8  PROT 6.6  ALBUMIN 3.3*   CBC: Recent Labs  Lab 05/26/17 1444  WBC 8.2  NEUTROABS 4.5  HGB 14.1  HCT 41.0  MCV 93.7  PLT 235   Cardiac Enzymes: Recent Labs  Lab 05/26/17 1444 05/26/17 1908 05/27/17 0138  TROPONINI <0.03 <0.03 <0.03   BNP (last 3 results) Recent Labs    04/20/17 2131 05/16/17 1000 05/26/17 1444  BNP 130.0* 72.0 160.0*     Studies: Nm Pulmonary Vent And Perf (v/q  Scan)  Result Date: 05/26/2017 CLINICAL DATA:  Shortness of breath, chest pain, severe COPD, evaluate for PE EXAM: NUCLEAR MEDICINE VENTILATION - PERFUSION LUNG SCAN TECHNIQUE: Ventilation images were obtained in multiple projections using inhaled aerosol Tc-79m DTPA. Perfusion images were obtained in multiple projections after intravenous injection of Tc-82m-MAA. RADIOPHARMACEUTICALS:  27.9 mCi of Tc-7m DTPA aerosol inhalation and 4.0 mCi Tc56m-MAA IV COMPARISON:  Correlation with chest radiograph dated 05/26/2017 FINDINGS: Ventilation: Clumping of radiotracer in the central airways, likely related to the patient's severe COPD. Perfusion: No wedge shaped  peripheral perfusion defects to suggest acute pulmonary embolism. On corresponding chest radiograph, the lungs are clear. IMPRESSION: Negative for pulmonary embolism. Electronically Signed   By: Julian Hy M.D.   On: 05/26/2017 22:44   Dg Chest Port 1 View  Result Date: 05/26/2017 CLINICAL DATA:  Chest pain and shortness of breath. EXAM: PORTABLE CHEST 1 VIEW COMPARISON:  Chest x-rays dated 05/16/2017, 04/05/2017 and 11/28/2015. FINDINGS: Stable mild cardiomegaly. Aortic atherosclerosis. Lungs are clear. No pleural effusion or pneumothorax seen. No acute or suspicious osseous finding. IMPRESSION: 1. Stable chest x-ray. No active disease. No evidence of pneumonia or pulmonary edema. 2. Mild cardiomegaly. 3. Aortic atherosclerosis. Electronically Signed   By: Franki Cabot M.D.   On: 05/26/2017 15:16    Scheduled Meds: . aspirin EC  81 mg Oral Daily  . budesonide (PULMICORT) nebulizer solution  0.5 mg Nebulization BID  . enoxaparin (LOVENOX) injection  40 mg Subcutaneous Q24H  . FLUoxetine  10 mg Oral QHS  . hydrALAZINE  25 mg Oral TID  . ipratropium-albuterol  3 mL Nebulization Q6H  . loratadine  10 mg Oral Daily  . methylPREDNISolone (SOLU-MEDROL) injection  60 mg Intravenous Q12H  . pantoprazole  40 mg Oral Daily  . sodium chloride flush  3 mL Intravenous Q12H  . sodium chloride flush  3 mL Intravenous Q12H  . tiotropium  18 mcg Inhalation Daily   Continuous Infusions: . sodium chloride      Assessment/Plan:  1. Acute COPD exacerbation.  Continue Solu-Medrol, nebulizer treatments.  2. Acute on chronic hypoxic respiratory failure.  Patient on chronic 3 L of oxygen 3. Hyperkalemia.  Give 1 dose of Kayexalate. 4. Chest pain with elevated d-dimer.  VQ scan negative.  Likely costochondritis with COPD exacerbation. 5. Essential hypertension.  Patient placed on hydralazine 6. Chronic kidney disease stage III.  Continue to monitor.   7. GERD on Protonix 8. Physical therapy  evaluation  Code Status:     Code Status Orders  (From admission, onward)        Start     Ordered   05/26/17 1730  Do not attempt resuscitation (DNR)  Continuous    Question Answer Comment  In the event of cardiac or respiratory ARREST Do not call a "code blue"   In the event of cardiac or respiratory ARREST Do not perform Intubation, CPR, defibrillation or ACLS   In the event of cardiac or respiratory ARREST Use medication by any route, position, wound care, and other measures to relive pain and suffering. May use oxygen, suction and manual treatment of airway obstruction as needed for comfort.   Comments MOST form in chart      05/26/17 1732    Code Status History    Date Active Date Inactive Code Status Order ID Comments User Context   05/17/2017 1228 05/21/2017 1511 DNR [id number]  Asencion Gowda, NP Inpatient   04/21/2017 0117 04/22/2017 1750 DNR [id number]  Lance Coon, MD  Inpatient   04/05/2017 1411 04/08/2017 2101 Full Code [id number]  Saundra Shelling, MD Inpatient   02/08/2016 1414 02/11/2016 1729 Full Code [id number]  Hessie Knows, MD Inpatient   11/27/2015 1645 11/29/2015 1628 Full Code [id number]  Idelle Crouch, MD Inpatient   02/10/2015 2122 02/12/2015 1809 Full Code [id number]  Lytle Butte, MD ED   09/16/2014 1220 09/19/2014 1736 Full Code [id number]  Aldean Jewett, MD Inpatient     Disposition Plan:  once breathing better can potentially go home  Time spent: 28 minutes  [place]

## 2017-05-28 ENCOUNTER — Inpatient Hospital Stay: Payer: Medicare HMO | Admitting: Unknown Physician Specialty

## 2017-05-28 ENCOUNTER — Ambulatory Visit: Payer: Self-pay | Admitting: *Deleted

## 2017-05-28 LAB — BASIC METABOLIC PANEL
ANION GAP: 8 (ref 5–15)
BUN: 40 mg/dL — ABNORMAL HIGH (ref 6–20)
CHLORIDE: 104 mmol/L (ref 101–111)
CO2: 27 mmol/L (ref 22–32)
CREATININE: 1.83 mg/dL — AB (ref 0.44–1.00)
Calcium: 8 mg/dL — ABNORMAL LOW (ref 8.9–10.3)
GFR calc non Af Amer: 26 mL/min — ABNORMAL LOW (ref >60)
GFR, EST AFRICAN AMERICAN: 30 mL/min — AB (ref >60)
Glucose, Bld: 168 mg/dL — ABNORMAL HIGH (ref 65–99)
Potassium: 4.6 mmol/L (ref 3.5–5.1)
SODIUM: 139 mmol/L (ref 135–145)

## 2017-05-28 MED ORDER — MAGNESIUM SULFATE 2 GM/50ML IV SOLN
2.0000 g | Freq: Once | INTRAVENOUS | Status: AC
  Administered 2017-05-28: 2 g via INTRAVENOUS
  Filled 2017-05-28: qty 50

## 2017-05-28 MED ORDER — IPRATROPIUM-ALBUTEROL 0.5-2.5 (3) MG/3ML IN SOLN
3.0000 mL | Freq: Four times a day (QID) | RESPIRATORY_TRACT | Status: DC
  Administered 2017-05-28 – 2017-05-30 (×9): 3 mL via RESPIRATORY_TRACT
  Filled 2017-05-28 (×9): qty 3

## 2017-05-28 MED ORDER — DILTIAZEM HCL ER COATED BEADS 180 MG PO CP24
180.0000 mg | ORAL_CAPSULE | Freq: Every day | ORAL | Status: DC
  Administered 2017-05-28 – 2017-05-30 (×3): 180 mg via ORAL
  Filled 2017-05-28 (×3): qty 1

## 2017-05-28 MED ORDER — METHYLPREDNISOLONE SODIUM SUCC 125 MG IJ SOLR
60.0000 mg | Freq: Every day | INTRAMUSCULAR | Status: DC
  Administered 2017-05-29 – 2017-05-30 (×2): 60 mg via INTRAVENOUS
  Filled 2017-05-28 (×2): qty 2

## 2017-05-28 NOTE — Progress Notes (Signed)
Please note, patient has a pending out patient PALLIATIVE referral for last admission. CMRN Isaias Cowman made aware. Flo Shanks RN, BSN, [facility] liaison [phone]

## 2017-05-28 NOTE — Evaluation (Signed)
Physical Therapy Evaluation Patient Details Name: Katherine [name] MRN: [id number] DOB: May 23, 1940 Today's Date: 05/28/2017   History of Present Illness  Patient is a 77 year old female admitted for a COPD exacerbation.  PMH includes angioedema, anxiety, CKD III, Htn, COPD, emphysema.  Clinical Impression  Pt is a 77 year old female who lives in a one story home with her husband.  Pt receives assistance for ADL's and ambulates with a RW at baseline.  Pt was in bed upon PT arrival and reports no pain.  Pt was able to perform bed mobility mod I and STS with supervision.  PT provided VC's for safe use of RW.  Pt ambulated 78 ft with RW and one standing rest break with O2 sats remaining at 94% on 2 L of O2.  PT noted gait deviations indicative of fall risk and was able to slowly navigate obstacles in room with use of RW.  She relies heavily on RW for balance.  Pt presented with overall weakness of UE and WNL strength of LE during MMT.  Pt will continue to benefit from skilled PT with focus on strength, tolerance to activity, functional mobility, safe use of RW and balance.    Follow Up Recommendations Home health PT    Equipment Recommendations  None recommended by PT    Recommendations for Other Services       Precautions / Restrictions Precautions Precautions: Fall Restrictions Weight Bearing Restrictions: No      Mobility  Bed Mobility Overal bed mobility: Modified Independent             General bed mobility comments: Increased time and use of bedrail.  No physical assist needed.  Transfers Overall transfer level: Needs assistance Equipment used: Rolling walker (2 wheeled) Transfers: Sit to/from Stand Sit to Stand: Supervision         General transfer comment: Pt able to perform STS without physical assist.  PT provided VC's for safe hand placement with use of RW.  Ambulation/Gait Ambulation/Gait assistance: Supervision Ambulation Distance (Feet): 100 Feet Assistive  device: Rolling walker (2 wheeled)     Gait velocity interpretation: <1.8 ft/sec, indicate of risk for recurrent falls General Gait Details: Able to ambulate 100 ft with one standing rest break and O2 remaining at 94%.  Pt presented with low foot clearance and lateral wt displacement to compensate for poor hip and knee flexion.  Pt relies on RW heavily for balance.  Stairs            Wheelchair Mobility    Modified Rankin (Stroke Patients Only)       Balance Overall balance assessment: Needs assistance Sitting-balance support: Bilateral upper extremity supported;Feet supported   Sitting balance - Comments: Able to sit at EOB for 3-4 min   Standing balance support: Bilateral upper extremity supported                 High level balance activites: Side stepping;Direction changes High Level Balance Comments: Pt able to perform all needed balance activity to navigate room.             Pertinent Vitals/Pain Pain Assessment: No/denies pain    Home Living Family/patient expects to be discharged to:: Private residence Living Arrangements: Spouse/significant other Available Help at Discharge: Family;Available 24 hours/day Type of Home: House Home Access: Stairs to enter Entrance Stairs-Rails: Can reach both Entrance Stairs-Number of Steps: 1 Home Layout: One level Home Equipment: Walker - 2 wheels;Bedside commode      Prior Function  Level of Independence: Needs assistance   Gait / Transfers Assistance Needed: Uses RW to walk short distances  ADL's / Homemaking Assistance Needed: Receives assistance for bathing and dressing.        Hand Dominance        Extremity/Trunk Assessment   Upper Extremity Assessment Upper Extremity Assessment: Generalized weakness(Grossly 3+/5 bilaterally)    Lower Extremity Assessment Lower Extremity Assessment: Overall WFL for tasks assessed(Grossly 4-/5 bilaterally.)    Cervical / Trunk Assessment Cervical / Trunk  Assessment: Kyphotic(Slightly kyphotic)  Communication   Communication: No difficulties  Cognition Arousal/Alertness: Awake/alert Behavior During Therapy: WFL for tasks assessed/performed Overall Cognitive Status: Within Functional Limits for tasks assessed                                        General Comments      Exercises     Assessment/Plan    PT Assessment Patient needs continued PT services  PT Problem List Decreased strength;Decreased mobility;Decreased balance;Decreased knowledge of use of DME;Cardiopulmonary status limiting activity;Decreased activity tolerance       PT Treatment Interventions DME instruction;Therapeutic activities;Gait training;Therapeutic exercise;Patient/family education;Stair training;Balance training;Functional mobility training;Neuromuscular re-education    PT Goals (Current goals can be found in the Care Plan section)  Acute Rehab PT Goals Patient Stated Goal: To go home and continue to work on strength and endurance. PT Goal Formulation: With patient Time For Goal Achievement: 06/11/17 Potential to Achieve Goals: Good    Frequency Min 2X/week   Barriers to discharge        Co-evaluation               AM-PAC PT "6 Clicks" Daily Activity  Outcome Measure Difficulty turning over in bed (including adjusting bedclothes, sheets and blankets)?: A Little Difficulty moving from lying on back to sitting on the side of the bed? : A Little Difficulty sitting down on and standing up from a chair with arms (e.g., wheelchair, bedside commode, etc,.)?: A Little Help needed moving to and from a bed to chair (including a wheelchair)?: A Little Help needed walking in hospital room?: A Little Help needed climbing 3-5 steps with a railing? : A Little 6 Click Score: 18    End of Session Equipment Utilized During Treatment: Gait belt;Oxygen Activity Tolerance: Patient limited by fatigue Patient left: in chair;with call bell/phone  within reach;with chair alarm set;with family/visitor present Nurse Communication: Mobility status PT Visit Diagnosis: Unsteadiness on feet (R26.81);Muscle weakness (generalized) (M62.81)    Time: 7510-2585 PT Time Calculation (min) (ACUTE ONLY): 25 min   Charges:   PT Evaluation $PT Eval Low Complexity: 1 Low     PT G Codes:   PT G-Codes **NOT FOR INPATIENT CLASS** Functional Assessment Tool Used: AM-PAC 6 Clicks Basic Mobility    Roxanne Gates, PT, DPT   Roxanne Gates 05/28/2017, 2:08 PM

## 2017-05-28 NOTE — Plan of Care (Signed)
  Problem: Clinical Measurements: Goal: Will remain free from infection Outcome: Progressing   Problem: Activity: Goal: Risk for activity intolerance will decrease Outcome: Progressing   Problem: Pain Managment: Goal: General experience of comfort will improve Outcome: Progressing   Problem: Safety: Goal: Ability to remain free from injury will improve Outcome: Progressing   

## 2017-05-28 NOTE — Progress Notes (Signed)
Patient ID: Katherine [name], female   DOB: Jan 11, 1941, 77 y.o.   MRN: [id number]   [facility] PROGRESS NOTE  Shakira Los JSE:[id number] DOB: 1941-02-03 DOA: 05/26/2017 PCP: [name], NP  HPI/Subjective: Patient still with left upper chest pain lasting only a few minutes at a time.  Feels like pinpricks.  Reproducible nature.  Still with some cough and shortness of breath and wheezing.  Objective: Vitals:   05/28/17 1357 05/28/17 1529  BP:  121/76  Pulse: (!) 116 (!) 101  Resp:  18  Temp:  98.7 F (37.1 C)  SpO2: 94% 98%    Filed Weights   05/26/17 1447 05/27/17 0350 05/28/17 0153  Weight: 113.4 kg (250 lb) 114.3 kg (252 lb) 115 kg (253 lb 9.6 oz)    ROS: Review of Systems  Constitutional: Negative for chills and fever.  Eyes: Negative for blurred vision.  Respiratory: Positive for cough, shortness of breath and wheezing.   Cardiovascular: Negative for chest pain.  Gastrointestinal: Negative for abdominal pain, constipation, diarrhea, nausea and vomiting.  Genitourinary: Negative for dysuria.  Musculoskeletal: Negative for joint pain.  Neurological: Negative for dizziness and headaches.   Exam: Physical Exam  Constitutional: She is oriented to person, place, and time.  HENT:  Nose: No mucosal edema.  Mouth/Throat: No oropharyngeal exudate or posterior oropharyngeal edema.  Eyes: Pupils are equal, round, and reactive to light. Conjunctivae, EOM and lids are normal.  Neck: No JVD present. Carotid bruit is not present. No edema present. No thyroid mass and no thyromegaly present.  Cardiovascular: S1 normal and S2 normal. An irregularly irregular rhythm present. Tachycardia present. Exam reveals no gallop.  No murmur heard. Pulses:      Dorsalis pedis pulses are 2+ on the right side, and 2+ on the left side.  Respiratory: No respiratory distress. She has decreased breath sounds in the right lower field and the left lower field. She has wheezes in the right  middle field, the right lower field, the left middle field and the left lower field. She has no rhonchi. She has no rales.  GI: Soft. Bowel sounds are normal. There is no tenderness.  Musculoskeletal:       Right ankle: She exhibits swelling.       Left ankle: She exhibits swelling.  Lymphadenopathy:    She has no cervical adenopathy.  Neurological: She is alert and oriented to person, place, and time. No cranial nerve deficit.  Skin: Skin is warm. No rash noted. Nails show no clubbing.  Psychiatric: She has a normal mood and affect.      Data Reviewed: Basic Metabolic Panel: Recent Labs  Lab 05/26/17 1444 05/27/17 0138 05/28/17 0504  NA 136 136 139  K 4.8 5.6* 4.6  CL 102 104 104  CO2 28 25 27   GLUCOSE 103* 186* 168*  BUN 30* 35* 40*  CREATININE 1.77* 1.85* 1.83*  CALCIUM 8.5* 8.5* 8.0*   Liver Function Tests: Recent Labs  Lab 05/26/17 1444  AST 24  ALT 18  ALKPHOS 65  BILITOT 0.8  PROT 6.6  ALBUMIN 3.3*   CBC: Recent Labs  Lab 05/26/17 1444  WBC 8.2  NEUTROABS 4.5  HGB 14.1  HCT 41.0  MCV 93.7  PLT 235   Cardiac Enzymes: Recent Labs  Lab 05/26/17 1444 05/26/17 1908 05/27/17 0138  TROPONINI <0.03 <0.03 <0.03   BNP (last 3 results) Recent Labs    04/20/17 2131 05/16/17 1000 05/26/17 1444  BNP 130.0* 72.0 160.0*  Studies: Nm Pulmonary Vent And Perf (v/q Scan)  Result Date: 05/26/2017 CLINICAL DATA:  Shortness of breath, chest pain, severe COPD, evaluate for PE EXAM: NUCLEAR MEDICINE VENTILATION - PERFUSION LUNG SCAN TECHNIQUE: Ventilation images were obtained in multiple projections using inhaled aerosol Tc-71m DTPA. Perfusion images were obtained in multiple projections after intravenous injection of Tc-82m-MAA. RADIOPHARMACEUTICALS:  27.9 mCi of Tc-50m DTPA aerosol inhalation and 4.0 mCi Tc7m-MAA IV COMPARISON:  Correlation with chest radiograph dated 05/26/2017 FINDINGS: Ventilation: Clumping of radiotracer in the central airways, likely  related to the patient's severe COPD. Perfusion: No wedge shaped peripheral perfusion defects to suggest acute pulmonary embolism. On corresponding chest radiograph, the lungs are clear. IMPRESSION: Negative for pulmonary embolism. Electronically Signed   By: Julian Hy M.D.   On: 05/26/2017 22:44    Scheduled Meds: . aspirin EC  81 mg Oral Daily  . budesonide (PULMICORT) nebulizer solution  0.5 mg Nebulization BID  . enoxaparin (LOVENOX) injection  40 mg Subcutaneous Q24H  . FLUoxetine  10 mg Oral QHS  . hydrALAZINE  25 mg Oral TID  . ipratropium-albuterol  3 mL Nebulization Q6H  . loratadine  10 mg Oral Daily  . methylPREDNISolone (SOLU-MEDROL) injection  60 mg Intravenous Q12H  . pantoprazole  40 mg Oral Daily  . sodium chloride flush  3 mL Intravenous Q12H  . sodium chloride flush  3 mL Intravenous Q12H  . tiotropium  18 mcg Inhalation Daily   Continuous Infusions: . sodium chloride      Assessment/Plan:  1. Acute COPD exacerbation.  Continue Solu-Medrol decreased to daily dosing and continue nebulizer treatments.  2. Atrial flutter seen on telemetry monitoring.  Start Cardizem CD and consult cardiology.  We will also give IV magnesium and check a TSH. 3. Acute on chronic hypoxic respiratory failure.  Patient on chronic 3 L of oxygen 4. Hyperkalemia.  Improved with dose of Kayexalate yesterday 5.  reproducible chest pain.  Steroid should help this 6. Essential hypertension.  Patient placed on hydralazine 7. Chronic kidney disease stage III.  Continue to monitor.   8. GERD on Protonix 9. Physical therapy evaluation appreciated  Code Status:     Code Status Orders  (From admission, onward)        Start     Ordered   05/26/17 1730  Do not attempt resuscitation (DNR)  Continuous    Question Answer Comment  In the event of cardiac or respiratory ARREST Do not call a "code blue"   In the event of cardiac or respiratory ARREST Do not perform Intubation, CPR,  defibrillation or ACLS   In the event of cardiac or respiratory ARREST Use medication by any route, position, wound care, and other measures to relive pain and suffering. May use oxygen, suction and manual treatment of airway obstruction as needed for comfort.   Comments MOST form in chart      05/26/17 1732    Code Status History    Date Active Date Inactive Code Status Order ID Comments User Context   05/17/2017 1228 05/21/2017 1511 DNR [id number]  Asencion Gowda, NP Inpatient   04/21/2017 0117 04/22/2017 1750 DNR [id number]  Lance Coon, MD Inpatient   04/05/2017 1411 04/08/2017 2101 Full Code [id number]  Saundra Shelling, MD Inpatient   02/08/2016 1414 02/11/2016 1729 Full Code [id number]  Hessie Knows, MD Inpatient   11/27/2015 1645 11/29/2015 1628 Full Code [id number]  Idelle Crouch, MD Inpatient   02/10/2015 2122 02/12/2015 1809 Full Code [id number]  Hower, Shanon Brow  K, MD ED   09/16/2014 1220 09/19/2014 1736 Full Code [id number]  Aldean Jewett, MD Inpatient     Disposition Plan: Hopefully once breathing better can go home.  Also atrial flutter seen on monitor.  Time spent: 28 minutes  Shelly

## 2017-05-28 NOTE — Progress Notes (Signed)
PT Cancellation Note  Patient Details Name: Katherine [name] MRN: [id number] DOB: Jul 26, 1940   Cancelled Treatment:    Reason Eval/Treat Not Completed: Patient declined, no reason specified.  Order received.  Chart reviewed.  Pt eating lunch and requested that PT give her time to finish and come back later.  Will re-attempt if time allows.   Roxanne Gates, PT, DPT 05/28/2017, 1:12 PM

## 2017-05-28 NOTE — Consult Note (Signed)
Cardiology Consultation Note    Patient ID: Katherine [name], MRN: [id number], DOB/AGE: 1940/06/22 77 y.o. Admit date: 05/26/2017   Date of Consult: 05/28/2017 Primary Physician: [name], NP Primary Cardiologist:    Chief Complaint: chest wall pain Reason for Consultation: chest pain/irregular heartbeat Requesting MD: Dr. Leslye Peer  HPI: Katherine [name] is a 77 y.o. female with history of hypertension, LVH, tobacco abuse, COPD who was admitted several days ago with increasing shortness of breath and pleuritic chest pain.  Her cardiac markers were normal.  Electrocardiogram showed no significant ischemia.  She underwent a VQ scan for pulmonary embolus.  Chest x-ray revealed cardiomegaly but no pulmonary edema or pneumonia.  Disease with a serum creatinine today of 1.83.  Shortness of breath improved.  She continues to have intermittent episodes of chest wall pain and pleuritic pain.  This is reproducible with deep breathing or deep palpation.  This is resolved over the day.  Complained of some palpitations and telemetry revealed sinus arrhythmia.  Symptoms are resolved at present.  Past Medical History:  Diagnosis Date  . Angioedema   . Anxiety   . Anxiety and depression   . Chronic constipation   . Chronic kidney disease    stage 3  . COPD (chronic obstructive pulmonary disease) ([place])   . Gallstones   . GERD (gastroesophageal reflux disease)   . Hypertension   . Left ventricular hypertrophy   . Osteoarthritis   . Prurigo nodularis   . Tobacco abuse   . Vitamin D deficiency disease       Surgical History:  Past Surgical History:  Procedure Laterality Date  . CHOLECYSTECTOMY    . 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   Procedure: TOTAL KNEE ARTHROPLASTY;  Surgeon: Hessie Knows, MD;  Location: [facility] ORS;  Service: Orthopedics;  Laterality: Right;     Home Meds: Prior to Admission medications   Medication Sig Start Date End Date  Taking? Authorizing Provider  acetaminophen (TYLENOL) 650 MG CR tablet Take 650-1,300 mg by mouth every 8 (eight) hours as needed for pain.    Yes [provider]  aspirin EC 81 MG tablet Take 81 mg by mouth daily.   Yes [provider]  cetirizine (ZYRTEC) 10 MG tablet TAKE 1/2 TABLET(5 MG) BY MOUTH AT BEDTIME 05/25/17  Yes [name], NP  cholecalciferol (VITAMIN D) 1000 units tablet Take 1,000 Units by mouth daily.   Yes [provider]  FLUoxetine (PROZAC) 10 MG capsule TAKE 1 CAPSULE(10 MG) BY MOUTH DAILY 01/30/17  Yes [name], NP  fluticasone furoate-vilanterol (BREO ELLIPTA) 100-25 MCG/INH AEPB Inhale 1 puff into the lungs daily. 04/27/16  Yes [name], MD  guaiFENesin (ROBITUSSIN) 100 MG/5ML SOLN Take 5 mLs (100 mg total) by mouth every 4 (four) hours as needed for cough or to loosen phlegm. 05/21/17  Yes Demetrios Loll, MD  hydrALAZINE (APRESOLINE) 25 MG tablet TAKE 1 TABLET(25 MG) BY MOUTH TWICE DAILY 04/26/17  Yes Volney American, PA-C  ipratropium-albuterol (DUONEB) 0.5-2.5 (3) MG/3ML SOLN Take 3 mLs by nebulization every 6 (six) hours as needed. Patient taking differently: Take 3 mLs by nebulization every 6 (six) hours as needed (shortness of breath and/or wheezing).  11/29/15  Yes [name], MD  omeprazole (PRILOSEC) 40 MG capsule Take 40 mg by mouth daily.   Yes [provider]  SPIRIVA HANDIHALER 18 MCG inhalation capsule PLACE 1 CAPSULE INTO INHALER AND INHALE EVERY DAY 04/25/17  Yes Volney American, PA-C  albuterol (PROVENTIL HFA) 108 (90 Base) MCG/ACT inhaler Inhale 2 puffs into the lungs every 4 (four) hours as needed for wheezing or shortness of breath. Patient not taking: Reported on 05/26/2017 04/25/17   Volney American, PA-C  meclizine (ANTIVERT) 25 MG tablet TAKE 1 TABLET(25 MG) BY MOUTH THREE TIMES DAILY AS NEEDED FOR DIZZINESS Patient not taking: Reported on 05/26/2017 10/27/16   Guadalupe Maple, MD   predniSONE (DELTASONE) 10 MG tablet 40 mg po daily for 1 day, 30 mg po daily for 1 day, 20 mg po daily for 1 day, 10 mg po daily for 1 day. Patient not taking: Reported on 05/26/2017 05/21/17   Demetrios Loll, MD    Inpatient Medications:  . aspirin EC  81 mg Oral Daily  . budesonide (PULMICORT) nebulizer solution  0.5 mg Nebulization BID  . diltiazem  180 mg Oral Daily  . enoxaparin (LOVENOX) injection  40 mg Subcutaneous Q24H  . FLUoxetine  10 mg Oral QHS  . hydrALAZINE  25 mg Oral TID  . ipratropium-albuterol  3 mL Nebulization Q6H  . loratadine  10 mg Oral Daily  . [START ON 05/29/2017] methylPREDNISolone (SOLU-MEDROL) injection  60 mg Intravenous Daily  . pantoprazole  40 mg Oral Daily  . sodium chloride flush  3 mL Intravenous Q12H  . sodium chloride flush  3 mL Intravenous Q12H  . tiotropium  18 mcg Inhalation Daily   . sodium chloride    . magnesium sulfate 1 - 4 g bolus IVPB      Allergies:  Allergies  Allergen Reactions  . Bee Venom Swelling  . Enalapril Maleate Swelling  . Other     Bee stings    Social History   Socioeconomic History  . Marital status: Married    Spouse name: Not on file  . Number of children: Not on file  . Years of education: Not on file  . Highest education level: Not on file  Occupational History  . Occupation: retired  Scientific laboratory technician  . Financial resource strain: Not on file  . Food insecurity:    Worry: Not on file    Inability: Not on file  . Transportation needs:    Medical: Not on file    Non-medical: Not on file  Tobacco Use  . Smoking status: Current Every Day Smoker    Packs/day: 0.50    Years: 60.00    Pack years: 30.00    Types: Cigarettes  . Smokeless tobacco: Never Used  Substance and Sexual Activity  . Alcohol use: No    Alcohol/week: 0.0 oz    Comment: rare  . Drug use: No  . Sexual activity: Yes  Lifestyle  . Physical activity:    Days per week: Not on file    Minutes per session: Not on file  . Stress: Not on  file  Relationships  . Social connections:    Talks on phone: Not on file    Gets together: Not on file    Attends religious service: Not on file    Active member of club or organization: Not on file    Attends meetings of clubs or organizations: Not on file    Relationship status: Not on file  . Intimate partner violence:    Fear of current or ex partner: Not on file    Emotionally abused: Not on file    Physically abused: Not on file    Forced sexual activity: Not on  file  Other Topics Concern  . Not on file  Social History Narrative  . Not on file     Family History  Problem Relation Age of Onset  . Alcohol abuse Mother   . Sickle cell anemia Daughter   . Hypertension Son   . Cancer Neg Hx   . COPD Neg Hx   . Diabetes Neg Hx   . Heart disease Neg Hx   . Stroke Neg Hx      Review of Systems: A 12-system review of systems was performed and is negative except as noted in the HPI.  Labs: Recent Labs    05/26/17 1444 05/26/17 1908 05/27/17 0138  TROPONINI <0.03 <0.03 <0.03   Lab Results  Component Value Date   WBC 8.2 05/26/2017   HGB 14.1 05/26/2017   HCT 41.0 05/26/2017   MCV 93.7 05/26/2017   PLT 235 05/26/2017    Recent Labs  Lab 05/26/17 1444  05/28/17 0504  NA 136   < > 139  K 4.8   < > 4.6  CL 102   < > 104  CO2 28   < > 27  BUN 30*   < > 40*  CREATININE 1.77*   < > 1.83*  CALCIUM 8.5*   < > 8.0*  PROT 6.6  --   --   BILITOT 0.8  --   --   ALKPHOS 65  --   --   ALT 18  --   --   AST 24  --   --   GLUCOSE 103*   < > 168*   < > = values in this interval not displayed.   Lab Results  Component Value Date   CHOL 157 03/21/2017   HDL 42 03/21/2017   LDLCALC 85 03/21/2017   TRIG 148 03/21/2017   No results found for: DDIMER  Radiology/Studies:  Ct Abdomen Pelvis Wo Contrast  Result Date: 05/04/2017 CLINICAL DATA:  Right upper quadrant pain and bloody stools. Diarrhea. EXAM: CT ABDOMEN AND PELVIS WITHOUT CONTRAST TECHNIQUE: Multidetector  CT imaging of the abdomen and pelvis was performed following the standard protocol without IV contrast. COMPARISON:  04/29/2004 and chest CT 04/12/2016 FINDINGS: Lower chest: Minimal bibasilar linear atelectasis. Calcified plaque over the right coronary artery. Hepatobiliary: Previous cholecystectomy. The liver and biliary tree are normal. Pancreas: Normal. Spleen: Normal. Adrenals/Urinary Tract: Right adrenal gland is normal. There is an oval left adrenal mass with minimal calcification measuring 1.9 cm and Hounsfield unit measurements of less than 10 compatible with an adenoma. Right kidney is normal in size with a punctate stone over the lower pole collecting system. Left kidney is slightly small without hydronephrosis or nephrolithiasis. Subcentimeter hypodensities over the upper pole and lower pole left kidney too small to characterize but likely cysts. Ureters and bladder are normal. Stomach/Bowel: Stomach is normal. Diverticula over the third portion of the duodenum. Remainder of the small bowel is normal. Appendix is normal. Colon is normal. Vascular/Lymphatic: Mild calcified plaque over the abdominal aorta. There is mild aneurysmal dilatation of the infrarenal abdominal aorta which measures 4.0 x 4.3 cm in AP and transverse dimension. No significant adenopathy. Reproductive: Uterus and ovaries are normal. Other: No free fluid or focal inflammatory change. Musculoskeletal: Degenerative change of the spine and hips. IMPRESSION: No acute findings in the abdomen/pelvis. 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 Recommend followup by ultrasound in 1 year. This recommendation follows ACR consensus guidelines: White Paper  of the ACR Incidental Findings Committee II on Vascular Findings. J Am Coll Radiol 2013; 10:789-794. Aortic Atherosclerosis (ICD10-I70.0). Somewhat small left kidney with 2 subcentimeter hypodensities too small to characterize but likely cysts.  Nonobstructing punctate stone over the lower pole right kidney. 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 Electronically Signed   By: Marin Olp M.D.   On: 05/04/2017 17:47   Nm Pulmonary Vent And Perf (v/q Scan)  Result Date: 05/26/2017 CLINICAL DATA:  Shortness of breath, chest pain, severe COPD, evaluate for PE EXAM: NUCLEAR MEDICINE VENTILATION - PERFUSION LUNG SCAN TECHNIQUE: Ventilation images were obtained in multiple projections using inhaled aerosol Tc-59m DTPA. Perfusion images were obtained in multiple projections after intravenous injection of Tc-30m-MAA. RADIOPHARMACEUTICALS:  27.9 mCi of Tc-76m DTPA aerosol inhalation and 4.0 mCi Tc54m-MAA IV COMPARISON:  Correlation with chest radiograph dated 05/26/2017 FINDINGS: Ventilation: Clumping of radiotracer in the central airways, likely related to the patient's severe COPD. Perfusion: No wedge shaped peripheral perfusion defects to suggest acute pulmonary embolism. On corresponding chest radiograph, the lungs are clear. IMPRESSION: Negative for pulmonary embolism. Electronically Signed   By: Julian Hy M.D.   On: 05/26/2017 22:44   Dg Chest Port 1 View  Result Date: 05/26/2017 CLINICAL DATA:  Chest pain and shortness of breath. EXAM: PORTABLE CHEST 1 VIEW COMPARISON:  Chest x-rays dated 05/16/2017, 04/05/2017 and 11/28/2015. FINDINGS: Stable mild cardiomegaly. Aortic atherosclerosis. Lungs are clear. No pleural effusion or pneumothorax seen. No acute or suspicious osseous finding. IMPRESSION: 1. Stable chest x-ray. No active disease. No evidence of pneumonia or pulmonary edema. 2. Mild cardiomegaly. 3. Aortic atherosclerosis. Electronically Signed   By: Franki Cabot M.D.   On: 05/26/2017 15:16   Dg Chest Port 1 View  Result Date: 05/16/2017 CLINICAL DATA:  Shortness of Breath EXAM: PORTABLE CHEST 1 VIEW COMPARISON:  April 20, 2017 FINDINGS: There is no edema or consolidation. The heart size and pulmonary vascularity are normal. No adenopathy. There  is aortic atherosclerosis. No evident bone lesions. IMPRESSION: Aortic atherosclerosis.  No edema or consolidation. Aortic Atherosclerosis (ICD10-I70.0). Electronically Signed   By: Lowella Grip III M.D.   On: 05/16/2017 09:30    Wt Readings from Last 3 Encounters:  05/28/17 115 kg (253 lb 9.6 oz)  05/16/17 113.4 kg (250 lb)  05/04/17 113.4 kg (250 lb)    EKG: Sinus rhythm with nonspecific ST-T wave changes  Physical Exam: Blood pressure 121/76, pulse (!) 101, temperature 98.7 F (37.1 C), temperature source Oral, resp. rate 18, height 5\' 7"  (1.702 m), weight 115 kg (253 lb 9.6 oz), SpO2 98 %. Body mass index is 39.72 kg/m. General: Well developed, well nourished, in no acute distress. Head: Normocephalic, atraumatic, sclera non-icteric, no xanthomas, nares are without discharge.  Neck: Negative for carotid bruits. JVD not elevated. Lungs: Clear bilaterally to auscultation without wheezes, rales, or rhonchi. Breathing is unlabored. Heart: RRR with S1 S2. No murmurs, rubs, or gallops appreciated. Abdomen: Soft, non-tender, non-distended with normoactive bowel sounds. No hepatomegaly. No rebound/guarding. No obvious abdominal masses. Msk:  Strength and tone appear normal for age. Extremities: No clubbing or cyanosis. No edema.  Distal pedal pulses are 2+ and equal bilaterally. Neuro: Alert and oriented X 3. No facial asymmetry. No focal deficit. Moves all extremities spontaneously. Psych:  Responds to questions appropriately with a normal affect.     Assessment and Plan  77 year old female admitted with shortness of breath and hypoxia.  She had pleuritic chest pain at the time.  She ruled out for  myocardial infarction.  Electrocardiogram showed no ischemia.  VQ scan was low probability for pulmonary embolus.  She had more intermittent pleuritic chest pain this morning.  Her heart rate was somewhat irregular.  Telemetry revealed sinus arrhythmia and was felt also showed possible atrial  flutter.  Review of strips suggest proximal artifact with sinus rhythm..  She was started on Cardizem CD.  Symptoms have improved.  Electrolytes were normal.  At this point we will continue with Cardizem CD at current regimen.  Does not appear to require any further cardiac workup.  Would ambulate and consider discharge.  Pain is nonischemic.  Signed, Teodoro Spray MD 05/28/2017, 4:52 PM Pager: [phone]

## 2017-05-28 NOTE — Plan of Care (Signed)
Patient complains of dyspnea with exertion. Patient has been placed on isolation for flu precautions. Results on 4/14 were negative.

## 2017-05-29 ENCOUNTER — Other Ambulatory Visit: Payer: Self-pay | Admitting: *Deleted

## 2017-05-29 LAB — BASIC METABOLIC PANEL
Anion gap: 6 (ref 5–15)
BUN: 39 mg/dL — AB (ref 6–20)
CALCIUM: 8.3 mg/dL — AB (ref 8.9–10.3)
CO2: 26 mmol/L (ref 22–32)
Chloride: 103 mmol/L (ref 101–111)
Creatinine, Ser: 1.75 mg/dL — ABNORMAL HIGH (ref 0.44–1.00)
GFR calc Af Amer: 32 mL/min — ABNORMAL LOW (ref >60)
GFR, EST NON AFRICAN AMERICAN: 27 mL/min — AB (ref >60)
GLUCOSE: 128 mg/dL — AB (ref 65–99)
Potassium: 4.4 mmol/L (ref 3.5–5.1)
Sodium: 135 mmol/L (ref 135–145)

## 2017-05-29 LAB — TSH: TSH: 0.969 u[IU]/mL (ref 0.350–4.500)

## 2017-05-29 NOTE — Care Management Important Message (Signed)
Copy of signed IM left in patient's room.    

## 2017-05-29 NOTE — Progress Notes (Signed)
Patient ID: Katherine [name], female   DOB: 30-Dec-1940, 77 y.o.   MRN: [id number]   [facility] PROGRESS NOTE  Lacresha Fusilier OIZ:[id number] DOB: 1941/01/25 DOA: 05/26/2017 PCP: [name], NP  HPI/Subjective: Patient still with left upper chest pain lasting only a few minutes at a time.  Feels like pinpricks.  Reproducible nature.  Still with some cough and shortness of breath and wheezing.  Objective: Vitals:   05/29/17 0352 05/29/17 0749  BP: 138/85 (!) 149/77  Pulse: 62 67  Resp: 20 18  Temp: 98.3 F (36.8 C) 98.1 F (36.7 C)  SpO2: 98% 97%    Filed Weights   05/27/17 0350 05/28/17 0153 05/29/17 0352  Weight: 114.3 kg (252 lb) 115 kg (253 lb 9.6 oz) 116.4 kg (256 lb 9.9 oz)    ROS: Review of Systems  Constitutional: Negative for chills and fever.  Eyes: Negative for blurred vision.  Respiratory: Positive for cough, shortness of breath and wheezing.   Cardiovascular: Negative for chest pain.  Gastrointestinal: Negative for abdominal pain, constipation, diarrhea, nausea and vomiting.  Genitourinary: Negative for dysuria.  Musculoskeletal: Negative for joint pain.  Neurological: Negative for dizziness and headaches.   Exam: Physical Exam  Constitutional: She is oriented to person, place, and time.  HENT:  Nose: No mucosal edema.  Mouth/Throat: No oropharyngeal exudate or posterior oropharyngeal edema.  Eyes: Pupils are equal, round, and reactive to light. Conjunctivae, EOM and lids are normal.  Neck: No JVD present. Carotid bruit is not present. No edema present. No thyroid mass and no thyromegaly present.  Cardiovascular: Regular rhythm, S1 normal and S2 normal. Exam reveals no gallop.  No murmur heard. Pulses:      Dorsalis pedis pulses are 2+ on the right side, and 2+ on the left side.  Respiratory: No respiratory distress. She has decreased breath sounds in the right lower field and the left lower field. She has wheezes in the right lower field and the left  lower field. She has no rhonchi. She has no rales.  GI: Soft. Bowel sounds are normal. There is no tenderness.  Musculoskeletal:       Right ankle: She exhibits swelling.       Left ankle: She exhibits swelling.  Lymphadenopathy:    She has no cervical adenopathy.  Neurological: She is alert and oriented to person, place, and time. No cranial nerve deficit.  Skin: Skin is warm. No rash noted. Nails show no clubbing.  Psychiatric: She has a normal mood and affect.      Data Reviewed: Basic Metabolic Panel: Recent Labs  Lab 05/26/17 1444 05/27/17 0138 05/28/17 0504 05/29/17 0543  NA 136 136 139 135  K 4.8 5.6* 4.6 4.4  CL 102 104 104 103  CO2 28 25 27 26   GLUCOSE 103* 186* 168* 128*  BUN 30* 35* 40* 39*  CREATININE 1.77* 1.85* 1.83* 1.75*  CALCIUM 8.5* 8.5* 8.0* 8.3*   Liver Function Tests: Recent Labs  Lab 05/26/17 1444  AST 24  ALT 18  ALKPHOS 65  BILITOT 0.8  PROT 6.6  ALBUMIN 3.3*   CBC: Recent Labs  Lab 05/26/17 1444  WBC 8.2  NEUTROABS 4.5  HGB 14.1  HCT 41.0  MCV 93.7  PLT 235   Cardiac Enzymes: Recent Labs  Lab 05/26/17 1444 05/26/17 1908 05/27/17 0138  TROPONINI <0.03 <0.03 <0.03   BNP (last 3 results) Recent Labs    04/20/17 2131 05/16/17 1000 05/26/17 1444  BNP 130.0* 72.0 160.0*  Scheduled Meds: . aspirin EC  81 mg Oral Daily  . budesonide (PULMICORT) nebulizer solution  0.5 mg Nebulization BID  . diltiazem  180 mg Oral Daily  . enoxaparin (LOVENOX) injection  40 mg Subcutaneous Q24H  . FLUoxetine  10 mg Oral QHS  . hydrALAZINE  25 mg Oral TID  . ipratropium-albuterol  3 mL Nebulization Q6H  . loratadine  10 mg Oral Daily  . methylPREDNISolone (SOLU-MEDROL) injection  60 mg Intravenous Daily  . pantoprazole  40 mg Oral Daily  . sodium chloride flush  3 mL Intravenous Q12H  . sodium chloride flush  3 mL Intravenous Q12H  . tiotropium  18 mcg Inhalation Daily   Continuous Infusions: . sodium chloride       Assessment/Plan:  1. Acute COPD exacerbation.  Continue Solu-Medrol daily dosing and continue nebulizer treatments.  Patient with numerous hospitalizations recently.  Need to follow-up with primary care physician quite often to try to reduce hospitalizations. 2. Sinus tachycardia and sinus arrhythmia questionable atrial flutter.  Started Cardizem CD and she is in normal sinus rhythm with better heart rate. 3. Acute on chronic hypoxic respiratory failure.  Patient on chronic 3 L of oxygen 4. Hyperkalemia.  Improved. 5. reproducible chest pain.  Steroid should help this 6. Essential hypertension.  Patient on hydralazine and Cardizem CD 7. Chronic kidney disease stage III.  Continue to monitor.   8. GERD on Protonix 9. Physical therapy evaluation appreciated  Code Status:     Code Status Orders  (From admission, onward)        Start     Ordered   05/26/17 1730  Do not attempt resuscitation (DNR)  Continuous    Question Answer Comment  In the event of cardiac or respiratory ARREST Do not call a "code blue"   In the event of cardiac or respiratory ARREST Do not perform Intubation, CPR, defibrillation or ACLS   In the event of cardiac or respiratory ARREST Use medication by any route, position, wound care, and other measures to relive pain and suffering. May use oxygen, suction and manual treatment of airway obstruction as needed for comfort.   Comments MOST form in chart      05/26/17 1732    Code Status History    Date Active Date Inactive Code Status Order ID Comments User Context   05/17/2017 1228 05/21/2017 1511 DNR [id number]  Asencion Gowda, NP Inpatient   04/21/2017 0117 04/22/2017 1750 DNR [id number]  Lance Coon, MD Inpatient   04/05/2017 1411 04/08/2017 2101 Full Code [id number]  Saundra Shelling, MD Inpatient   02/08/2016 1414 02/11/2016 1729 Full Code [id number]  Hessie Knows, MD Inpatient   11/27/2015 1645 11/29/2015 1628 Full Code [id number]  Idelle Crouch, MD Inpatient    02/10/2015 2122 02/12/2015 1809 Full Code [id number]  Lytle Butte, MD ED   09/16/2014 1220 09/19/2014 1736 Full Code [id number]  Aldean Jewett, MD Inpatient     Disposition Plan:  with numerous hospitalizations she will need home health to be resumed with COPD protocol to try to reduce hospitalizations.  Time spent: 26 minutes, spoke with husband  on the phone  [employer]

## 2017-05-29 NOTE — Patient Outreach (Signed)
This encounter was created in error - please disregard.

## 2017-05-30 ENCOUNTER — Telehealth: Payer: Self-pay

## 2017-05-30 DIAGNOSIS — J441 Chronic obstructive pulmonary disease with (acute) exacerbation: Secondary | ICD-10-CM | POA: Diagnosis not present

## 2017-05-30 MED ORDER — PREDNISONE 10 MG PO TABS: ORAL_TABLET | ORAL | 0 refills | Status: DC

## 2017-05-30 MED ORDER — HYDRALAZINE HCL 25 MG PO TABS: 25.0000 mg | ORAL_TABLET | Freq: Three times a day (TID) | ORAL | 0 refills | Status: DC

## 2017-05-30 MED ORDER — ALBUTEROL SULFATE HFA 108 (90 BASE) MCG/ACT IN AERS: 2.0000 | INHALATION_SPRAY | RESPIRATORY_TRACT | 0 refills | Status: DC | PRN

## 2017-05-30 MED ORDER — DILTIAZEM HCL ER COATED BEADS 120 MG PO CP24: 120.0000 mg | ORAL_CAPSULE | Freq: Every day | ORAL | 0 refills | Status: DC

## 2017-05-30 NOTE — Care Management (Addendum)
Patient admitted to hospital for COPD exacerbation.  Patient lives at home with husband and he provides transportation.  PCP Julian Hy.  Patient denies issues obtaining medication. Patient has chronic O2 through [place].  Patient has nebulizer,cane, shower seat, and BSC in the home.  Family to bring portable O2 for discharge.  Patient open with [place] services through [facility].  Patient was set up with COPD protocol previous admission, however protocol was signed this admission and provided to [facility] with [facility]. Janci with [facility] notified of discharge.  Santiago Glad with [facility] notified of discharge.   RNCM signing off.

## 2017-05-30 NOTE — Discharge Instructions (Signed)
Recommend outpatient sleep study

## 2017-05-30 NOTE — Progress Notes (Signed)
IV and tele removed. Discharge instructions/ DNR given to pt. Husband here to take pt home. Home health set up. Pt has no further concerns at this time.

## 2017-05-30 NOTE — Telephone Encounter (Signed)
Transition Care Management Follow-up Telephone Call  Spoke with patients husband.   Date discharged?05/30/2017   How have you been since you were released from the hospital? "shes doing okay, were sitting outside enjoying the weather"   Do you understand why you were in the hospital? yes   Do you understand the discharge instructions? yes   Where were you discharged to? home   Items Reviewed:  Medications reviewed: yes  Allergies reviewed: yes  Dietary changes reviewed: yes  Referrals reviewed: yes   Functional Questionnaire:   Activities of Daily Living (ADLs):   She states they are independent in the following: ambulation, bathing and hygiene, feeding, continence, grooming, toileting and dressing States they require assistance with the following: none   Any transportation issues/concerns?: no   Any patient concerns? no   Confirmed importance and date/time of follow-up visits scheduled yes  Provider Appointment booked with Regino Schultze on 06/12/2017 at 1:00pm  Confirmed with patient if condition begins to worsen call PCP or go to the ER.  Patient was given the office number and encouraged to call back with question or concerns.  : yes

## 2017-05-30 NOTE — Discharge Summary (Signed)
Katherine [name] NAME: Katherine [name]    MR#:  [id number]  DATE OF BIRTH:  02-05-1941  DATE OF ADMISSION:  05/26/2017 ADMITTING PHYSICIAN: Hillary Bow, MD  DATE OF DISCHARGE: 05/30/2017 11:41 AM  PRIMARY CARE PHYSICIAN: [name], NP    ADMISSION DIAGNOSIS:  Shortness of breath [R06.02] Atypical chest pain [R07.89] Lightning attack, initial encounter [T75.00XA]  DISCHARGE DIAGNOSIS:  Active Problems:   COPD exacerbation ([place])   SECONDARY DIAGNOSIS:   Past Medical History:  Diagnosis Date  . Angioedema   . Anxiety   . Anxiety and depression   . Chronic constipation   . Chronic kidney disease    stage 3  . COPD (chronic obstructive pulmonary disease) ([place])   . Gallstones   . GERD (gastroesophageal reflux disease)   . Hypertension   . Left ventricular hypertrophy   . Osteoarthritis   . Prurigo nodularis   . Tobacco abuse   . Vitamin D deficiency disease     HOSPITAL COURSE:   1.  Acute COPD exacerbation.  The patient was given Solu-Medrol here in the hospital.  Switched over to quick prednisone taper upon going home.  Home health set up with COPD protocol to prevent re-hospitalizations. 2.  Sinus tachycardia and sinus arrhythmia with questionable atrial flutter.  Started on Cardizem CD and patient is in normal sinus rhythm with better heart rate.  Cardizem CD dose decreased from 180 mg down to 120 mg on discharge since heart rate is now in the 60s. 3.  Acute on chronic respiratory failure.  Patient on chronically 3 L of oxygen. 4.  Hyperkalemia this has improved. 5.  Reproducible chest pain steroids did help this.  Pain only last a few seconds at a time this is not cardiac in nature.  VQ scan negative for pulmonary embolism. 6.  Essential hypertension.  BP very variable here.  Patient on hydralazine which was increased to 3 times daily dosing and Cardizem CD. 7.  Chronic kidney disease stage III.  Continue to  monitor 8.  GERD on Protonix 9.  Physical therapy recommended home with home health which was set up.     DISCHARGE CONDITIONS:   Satisfacory  CONSULTS OBTAINED:  Treatment Team:  Teodoro Spray, MD  DRUG ALLERGIES:   Allergies  Allergen Reactions  . Bee Venom Swelling  . Enalapril Maleate Swelling  . Other     Bee stings    DISCHARGE MEDICATIONS:   Allergies as of 05/30/2017      Reactions   Bee Venom Swelling   Enalapril Maleate Swelling   Other    Bee stings      Medication List    STOP taking these medications   meclizine 25 MG tablet Commonly known as:  ANTIVERT     TAKE these medications   acetaminophen 650 MG CR tablet Commonly known as:  TYLENOL Take 650-1,300 mg by mouth every 8 (eight) hours as needed for pain.   albuterol 108 (90 Base) MCG/ACT inhaler Commonly known as:  PROVENTIL HFA Inhale 2 puffs into the lungs every 4 (four) hours as needed for wheezing or shortness of breath.   aspirin EC 81 MG tablet Take 81 mg by mouth daily.   cetirizine 10 MG tablet Commonly known as:  ZYRTEC TAKE 1/2 TABLET(5 MG) BY MOUTH AT BEDTIME   cholecalciferol 1000 units tablet Commonly known as:  VITAMIN D Take 1,000 Units by mouth daily.   diltiazem 120 MG 24  hr capsule Commonly known as:  CARDIZEM CD Take 1 capsule (120 mg total) by mouth daily.   FLUoxetine 10 MG capsule Commonly known as:  PROZAC TAKE 1 CAPSULE(10 MG) BY MOUTH DAILY   fluticasone furoate-vilanterol 100-25 MCG/INH Aepb Commonly known as:  BREO ELLIPTA Inhale 1 puff into the lungs daily.   guaiFENesin 100 MG/5ML Soln Commonly known as:  ROBITUSSIN Take 5 mLs (100 mg total) by mouth every 4 (four) hours as needed for cough or to loosen phlegm.   hydrALAZINE 25 MG tablet Commonly known as:  APRESOLINE Take 1 tablet (25 mg total) by mouth 3 (three) times daily. What changed:  See the new instructions.   ipratropium-albuterol 0.5-2.5 (3) MG/3ML Soln Commonly known as:   DUONEB Take 3 mLs by nebulization every 6 (six) hours as needed. What changed:  reasons to take this   omeprazole 40 MG capsule Commonly known as:  PRILOSEC Take 40 mg by mouth daily.   predniSONE 10 MG tablet Commonly known as:  DELTASONE Take 3 tabs po day1; 2 tabs po day2; 1 tab po day 3 then stop What changed:  additional instructions   SPIRIVA HANDIHALER 18 MCG inhalation capsule Generic drug:  tiotropium PLACE 1 CAPSULE INTO INHALER AND INHALE EVERY DAY        DISCHARGE INSTRUCTIONS:   Follow-up PMD 6 days  If you experience worsening of your admission symptoms, develop shortness of breath, life threatening emergency, suicidal or homicidal thoughts you must seek medical attention immediately by calling 911 or calling your MD immediately  if symptoms less severe.  You Must read complete instructions/literature along with all the possible adverse reactions/side effects for all the Medicines you take and that have been prescribed to you. Take any new Medicines after you have completely understood and accept all the possible adverse reactions/side effects.   Please note  You were cared for by a hospitalist during your hospital stay. If you have any questions about your discharge medications or the care you received while you were in the hospital after you are discharged, you can call the unit and asked to speak with the hospitalist on call if the hospitalist that took care of you is not available. Once you are discharged, your primary care physician will handle any further medical issues. Please note that NO REFILLS for any discharge medications will be authorized once you are discharged, as it is imperative that you return to your primary care physician (or establish a relationship with a primary care physician if you do not have one) for your aftercare needs so that they can reassess your need for medications and monitor your lab values.    Today   CHIEF COMPLAINT:   Chief  Complaint  Patient presents with  . Chest Pain  . Shortness of Breath    HISTORY OF PRESENT ILLNESS:  Katherine [name]  is a 77 y.o. female with a known history of patient came in with chest pain and shortness of breath   VITAL SIGNS:  Blood pressure (!) 153/90, pulse (!) 58, temperature 97.7 F (36.5 C), temperature source Oral, resp. rate 19, height 5\' 7"  (1.702 m), weight 116.7 kg (257 lb 4.8 oz), SpO2 100 %.   PHYSICAL EXAMINATION:  GENERAL:  77 y.o.-year-old patient lying in the bed with no acute distress.  EYES: Pupils equal, round, reactive to light and accommodation. No scleral icterus. Extraocular muscles intact.  HEENT: Head atraumatic, normocephalic. Oropharynx and nasopharynx clear.  NECK:  Supple, no jugular venous  distention. No thyroid enlargement, no tenderness.  LUNGS:  decreased breath sounds bilaterally, no wheezing, rales,rhonchi or crepitation. No use of accessory muscles of respiration.  CARDIOVASCULAR: S1, S2 normal. No murmurs, rubs, or gallops.  ABDOMEN: Soft, non-tender, non-distended. Bowel sounds present. No organomegaly or mass.  EXTREMITIES: No pedal edema, cyanosis, or clubbing.  NEUROLOGIC: Cranial nerves II through XII are intact. Muscle strength 5/5 in all extremities. Sensation intact. Gait not checked.  PSYCHIATRIC: The patient is alert and oriented x 3.  SKIN: No obvious rash, lesion, or ulcer.   DATA REVIEW:   CBC Recent Labs  Lab 05/26/17 1444  WBC 8.2  HGB 14.1  HCT 41.0  PLT 235    Chemistries  Recent Labs  Lab 05/26/17 1444  05/29/17 0543  NA 136   < > 135  K 4.8   < > 4.4  CL 102   < > 103  CO2 28   < > 26  GLUCOSE 103*   < > 128*  BUN 30*   < > 39*  CREATININE 1.77*   < > 1.75*  CALCIUM 8.5*   < > 8.3*  AST 24  --   --   ALT 18  --   --   ALKPHOS 65  --   --   BILITOT 0.8  --   --    < > = values in this interval not displayed.    Cardiac Enzymes Recent Labs  Lab 05/27/17 0138  TROPONINI <0.03    Microbiology  Results  Results for orders placed or performed during the hospital encounter of 04/03/16  Body fluid culture     Status: None   Collection Time: 04/03/16 12:15 PM  Result Value Ref Range Status   Specimen Description KNEE RIGHT KNEE  Final   Special Requests NONE  Final   Gram Stain   Final    ABUNDANT WBC PRESENT, PREDOMINANTLY MONONUCLEAR NO ORGANISMS SEEN    Culture   Final    NO GROWTH 3 DAYS Performed at [facility], [place] [address] [place], [place] [address]    Report Status 04/06/2016 FINAL  Final     Management plans discussed with the patient, family and they are in agreement.  CODE STATUS:     Code Status Orders  (From admission, onward)        Start     Ordered   05/26/17 1730  Do not attempt resuscitation (DNR)  Continuous    Question Answer Comment  In the event of cardiac or respiratory ARREST Do not call a "code blue"   In the event of cardiac or respiratory ARREST Do not perform Intubation, CPR, defibrillation or ACLS   In the event of cardiac or respiratory ARREST Use medication by any route, position, wound care, and other measures to relive pain and suffering. May use oxygen, suction and manual treatment of airway obstruction as needed for comfort.   Comments MOST form in chart      05/26/17 1732    Code Status History    Date Active Date Inactive Code Status Order ID Comments User Context   05/17/2017 1228 05/21/2017 1511 DNR [id number]  Asencion Gowda, NP Inpatient   04/21/2017 0117 04/22/2017 1750 DNR [id number]  Lance Coon, MD Inpatient   04/05/2017 1411 04/08/2017 2101 Full Code [id number]  Saundra Shelling, MD Inpatient   02/08/2016 1414 02/11/2016 1729 Full Code [id number]  Hessie Knows, MD Inpatient   11/27/2015 1645 11/29/2015 1628 Full Code [id number]  Idelle Crouch, MD Inpatient   02/10/2015 2122 02/12/2015 1809 Full Code [id number]  Lytle Butte, MD ED   09/16/2014 1220 09/19/2014 1736 Full Code [id number]  Aldean Jewett, MD Inpatient       TOTAL TIME TAKING CARE OF THIS PATIENT: 35 minutes.    Loletha Grayer M.D on 05/30/2017 at 2:39 PM  Between 7am to 6pm - Pager - [phone]  After 6pm go to [email or web address] - password [email or web address]  [facility] Office  [phone]  CC: Primary care physician; [name], NP

## 2017-05-31 ENCOUNTER — Other Ambulatory Visit: Payer: Self-pay | Admitting: *Deleted

## 2017-05-31 LAB — BLOOD GAS, VENOUS
Patient temperature: 37
pCO2, Ven: 58 mmHg (ref 44.0–60.0)
pH, Ven: 7.33 (ref 7.250–7.430)

## 2017-05-31 NOTE — Patient Outreach (Signed)
[facility] [facility]) Care Management  05/31/2017  Katherine [name] 1940-12-26 [id number]  Successful telephone encounter to Katherine [name], 77 year old female follow up recent hospitalization  April 13-17,2019 for COPD exacerbation.    Unable to contact pt after previous in patent  Stay April 4-8,2019 for COPD exacerbation, Hypertensive crisis as this RN CM made attempt to follow up  On referral from [place] hospital liaison for [facility] CM services. PCP office on list for  Doing transition of care call, RN CM to follow for chronic disease management.       Spoke with pt,HIPAA dentifiers verified, discussed purpose of call- follow up on recent in patient   stay.    Pt  Reports doing alright since discharge home, breathing good,no sob, on continuous 2 liters  [place].   RN CM had difficulty hearing pt,noise in the background, pt had to repeat herself several times.   Pt reports on discharge medications,got one and plans to get the rest tonight.    MD follow up appointment-  Pt reports still needs to call PCP office, schedule follow up appointment,      Plans to call next week 06/04/17.    Plan: As discussed with pt, plan to follow up again next week- initial home visit.           Plan to inform PCP of [facility] involvement- send Barrier letter.   Katherine [name].   [place] Care Management  [phone]

## 2017-06-01 DIAGNOSIS — F17210 Nicotine dependence, cigarettes, uncomplicated: Secondary | ICD-10-CM | POA: Diagnosis not present

## 2017-06-01 DIAGNOSIS — F329 Major depressive disorder, single episode, unspecified: Secondary | ICD-10-CM | POA: Diagnosis not present

## 2017-06-01 DIAGNOSIS — I129 Hypertensive chronic kidney disease with stage 1 through stage 4 chronic kidney disease, or unspecified chronic kidney disease: Secondary | ICD-10-CM | POA: Diagnosis not present

## 2017-06-01 DIAGNOSIS — J439 Emphysema, unspecified: Secondary | ICD-10-CM | POA: Diagnosis not present

## 2017-06-01 DIAGNOSIS — G4733 Obstructive sleep apnea (adult) (pediatric): Secondary | ICD-10-CM | POA: Diagnosis not present

## 2017-06-01 DIAGNOSIS — M1990 Unspecified osteoarthritis, unspecified site: Secondary | ICD-10-CM | POA: Diagnosis not present

## 2017-06-01 DIAGNOSIS — K219 Gastro-esophageal reflux disease without esophagitis: Secondary | ICD-10-CM | POA: Diagnosis not present

## 2017-06-01 DIAGNOSIS — I2510 Atherosclerotic heart disease of native coronary artery without angina pectoris: Secondary | ICD-10-CM | POA: Diagnosis not present

## 2017-06-01 DIAGNOSIS — N183 Chronic kidney disease, stage 3 (moderate): Secondary | ICD-10-CM | POA: Diagnosis not present

## 2017-06-04 ENCOUNTER — Telehealth: Payer: Self-pay | Admitting: Unknown Physician Specialty

## 2017-06-04 DIAGNOSIS — J449 Chronic obstructive pulmonary disease, unspecified: Secondary | ICD-10-CM | POA: Diagnosis not present

## 2017-06-04 DIAGNOSIS — G4733 Obstructive sleep apnea (adult) (pediatric): Secondary | ICD-10-CM | POA: Diagnosis not present

## 2017-06-04 DIAGNOSIS — F17210 Nicotine dependence, cigarettes, uncomplicated: Secondary | ICD-10-CM | POA: Diagnosis not present

## 2017-06-04 DIAGNOSIS — M1990 Unspecified osteoarthritis, unspecified site: Secondary | ICD-10-CM | POA: Diagnosis not present

## 2017-06-04 DIAGNOSIS — I129 Hypertensive chronic kidney disease with stage 1 through stage 4 chronic kidney disease, or unspecified chronic kidney disease: Secondary | ICD-10-CM | POA: Diagnosis not present

## 2017-06-04 DIAGNOSIS — J439 Emphysema, unspecified: Secondary | ICD-10-CM | POA: Diagnosis not present

## 2017-06-04 DIAGNOSIS — N183 Chronic kidney disease, stage 3 (moderate): Secondary | ICD-10-CM | POA: Diagnosis not present

## 2017-06-04 DIAGNOSIS — I2510 Atherosclerotic heart disease of native coronary artery without angina pectoris: Secondary | ICD-10-CM | POA: Diagnosis not present

## 2017-06-04 DIAGNOSIS — K219 Gastro-esophageal reflux disease without esophagitis: Secondary | ICD-10-CM | POA: Diagnosis not present

## 2017-06-04 DIAGNOSIS — F329 Major depressive disorder, single episode, unspecified: Secondary | ICD-10-CM | POA: Diagnosis not present

## 2017-06-04 NOTE — Telephone Encounter (Signed)
Copied from [place] [id number]. Topic: Quick Communication - See Telephone Encounter >> Jun 04, 2017 11:38 AM Aurelio Brash B wrote: CRM for notification. See Telephone encounter for: 06/04/17. Verbal order for home health    PT   2x  a week for 3 weeks,        [phone]

## 2017-06-04 NOTE — Telephone Encounter (Signed)
OK. Thanks.

## 2017-06-04 NOTE — Telephone Encounter (Signed)
Called and let Caryl Pina know that [place] gave the ok for verbal orders.

## 2017-06-04 NOTE — Telephone Encounter (Signed)
Copied from [place] [phone]. Topic: Quick Communication - See Telephone Encounter >> Jun 04, 2017  9:18 AM Arletha Grippe wrote: CRM for notification. See Telephone encounter for: 06/04/17.  Caryl Pina with [facility] home [facility] called  Verbal orders for nursing  1 week 1 2 week 2 1 week 5  3 prn visits.  Cb is [phone]

## 2017-06-04 NOTE — Telephone Encounter (Signed)
Routing to provider  

## 2017-06-05 ENCOUNTER — Encounter: Payer: Self-pay | Admitting: *Deleted

## 2017-06-05 ENCOUNTER — Other Ambulatory Visit: Payer: Self-pay | Admitting: *Deleted

## 2017-06-05 DIAGNOSIS — M1990 Unspecified osteoarthritis, unspecified site: Secondary | ICD-10-CM | POA: Diagnosis not present

## 2017-06-05 DIAGNOSIS — I2510 Atherosclerotic heart disease of native coronary artery without angina pectoris: Secondary | ICD-10-CM | POA: Diagnosis not present

## 2017-06-05 DIAGNOSIS — I129 Hypertensive chronic kidney disease with stage 1 through stage 4 chronic kidney disease, or unspecified chronic kidney disease: Secondary | ICD-10-CM | POA: Diagnosis not present

## 2017-06-05 DIAGNOSIS — F329 Major depressive disorder, single episode, unspecified: Secondary | ICD-10-CM | POA: Diagnosis not present

## 2017-06-05 DIAGNOSIS — K219 Gastro-esophageal reflux disease without esophagitis: Secondary | ICD-10-CM | POA: Diagnosis not present

## 2017-06-05 DIAGNOSIS — J439 Emphysema, unspecified: Secondary | ICD-10-CM | POA: Diagnosis not present

## 2017-06-05 DIAGNOSIS — G4733 Obstructive sleep apnea (adult) (pediatric): Secondary | ICD-10-CM | POA: Diagnosis not present

## 2017-06-05 DIAGNOSIS — F17210 Nicotine dependence, cigarettes, uncomplicated: Secondary | ICD-10-CM | POA: Diagnosis not present

## 2017-06-05 DIAGNOSIS — N183 Chronic kidney disease, stage 3 (moderate): Secondary | ICD-10-CM | POA: Diagnosis not present

## 2017-06-05 NOTE — Patient Outreach (Signed)
[facility]) Care Management   06/05/2017  Katherine [name] 05/08/1940 [id number]  Katherine [name] is an 77 y.o. female  Subjective:  Pt reports has sob with exertion, rest and purse lip breathing helps.  Pt reports quit smoking a month ago to which spouse said was after a hospitalization.    Pt reports wants to start getting back outdoors walking, Has HH RN and HH PT services.   Pt reports to see PCP 06/12/17.  Objective:   Vitals:   06/05/17 1332  BP: 102/64  Pulse: 83  Resp: (!) 24  SpO2: 96%    ROS  Physical Exam  Constitutional: She is oriented to person, place, and time. She appears well-developed and well-nourished.  Cardiovascular: Normal rate, regular rhythm and normal heart sounds.  Respiratory: Effort normal and breath sounds normal.  On continuous 2 Liters [place]   GI: Soft. Bowel sounds are normal.  Musculoskeletal: She exhibits edema.  Trace edema bilateral ankles/top of feet   Neurological: She is alert and oriented to person, place, and time.  Skin: Skin is warm and dry.  Psychiatric: She has a normal mood and affect. Her behavior is normal. Thought content normal.    Encounter Medications:   Outpatient Encounter Medications as of 06/05/2017  Medication Sig  . acetaminophen (TYLENOL) 650 MG CR tablet Take 650-1,300 mg by mouth every 8 (eight) hours as needed for pain.   [name] albuterol (PROVENTIL HFA) 108 (90 Base) MCG/ACT inhaler Inhale 2 puffs into the lungs every 4 (four) hours as needed for wheezing or shortness of breath.  [name] aspirin EC 81 MG tablet Take 81 mg by mouth daily.  . cetirizine (ZYRTEC) 10 MG tablet TAKE 1/2 TABLET(5 MG) BY MOUTH AT BEDTIME  . cholecalciferol (VITAMIN D) 1000 units tablet Take 1,000 Units by mouth daily.  [name] diltiazem (CARDIZEM CD) 120 MG 24 hr capsule Take 1 capsule (120 mg total) by mouth daily.  [name] FLUoxetine (PROZAC) 10 MG capsule TAKE 1 CAPSULE(10 MG) BY MOUTH DAILY  . fluticasone furoate-vilanterol (BREO ELLIPTA)  100-25 MCG/INH AEPB Inhale 1 puff into the lungs daily.  [name] guaiFENesin (ROBITUSSIN) 100 MG/5ML SOLN Take 5 mLs (100 mg total) by mouth every 4 (four) hours as needed for cough or to loosen phlegm.  . hydrALAZINE (APRESOLINE) 25 MG tablet Take 1 tablet (25 mg total) by mouth 3 (three) times daily.  [name] ipratropium-albuterol (DUONEB) 0.5-2.5 (3) MG/3ML SOLN Take 3 mLs by nebulization every 6 (six) hours as needed. (Patient taking differently: Take 3 mLs by nebulization every 6 (six) hours as needed (shortness of breath and/or wheezing). )  . omeprazole (PRILOSEC) 40 MG capsule Take 40 mg by mouth daily.  . predniSONE (DELTASONE) 10 MG tablet Take 3 tabs po day1; 2 tabs po day2; 1 tab po day 3 then stop  . SPIRIVA HANDIHALER 18 MCG inhalation capsule PLACE 1 CAPSULE INTO INHALER AND INHALE EVERY DAY   No facility-administered encounter medications on file as of 06/05/2017.     Functional Status:   In your present state of health, do you have any difficulty performing the following activities: 06/05/2017 05/29/2017  Hearing? N N  Vision? Y N  Difficulty concentrating or making decisions? N N  Walking or climbing stairs? Y Y  Dressing or bathing? Y N  Doing errands, shopping? Y N  Preparing Food and eating ? Y -  Using the Toilet? N -  In the past six months, have you accidently leaked urine? N -  Do  you have problems with loss of bowel control? N -  Managing your Medications? Y -  Managing your Finances? Y -  Housekeeping or managing your Housekeeping? Y -  Some recent data might be hidden    Fall/Depression Screening:    Fall Risk  06/05/2017 03/21/2017 09/06/2016  Falls in the past year? No No No   PHQ 2/9 Scores 06/05/2017 03/21/2017 09/12/2016 09/06/2016 06/12/2016 11/10/2015 09/07/2015  PHQ - 2 Score 0 1 0 0 2 0 0  PHQ- 9 Score - 3 6 - 7 - 3    Assessment:  Pleasant 77 year old female, resides with spouse who is very supportive, manages  Her medications.   Pt had 2 admits in 30 days: April 4-8  for COPD exacerbation, Hypertension  Crisis and April 13-17,2019 for COPD exacerbation.   Pt's history includes but not limited to  COPD, Hypertension, Chronic kidney disease stage III.    COPD:  O 2 sat with 2 liters  at rest - 96%, after short duration of exercise- sob noted, O sat      Sat  91 %.    Hypertension: BP today 102/64, no complaints of dizziness, per pt stays hydrated.  Medications: Patient was recently discharged from hospital and all medications have been reviewed.  Plan: As discussed with pt, continue to provide Community CM services- follow up within the      Next 2 weeks telephonically and next month with home visit.             Plan to send [name] NP by in basket 06/05/17 initial home visit.     [facility] CM Care Plan Problem One     Most Recent Value  Care Plan Problem One  COPD- frequent admits 2 in 30 days   Role Documenting the Problem One  Care Management Coordinator  Care Plan for Problem One  Active  [facility] Long Term Goal   Pt's knowledge of  self management of COPD would improve in the next 13 days   [facility] Long Term Goal Start Date  05/31/17  Interventions for Problem One Long Term Goal  initial home visit done- provided pt COPD Action plan/reviewed zones with special attention to yellow zone   [facility] CM Short Term Goal #1   Pt would take all medications as ordered for the next 30 days   [facility] CM Short Term Goal #1 Start Date  05/31/17  Interventions for Short Term Goal #1  Medications reviewed with pt/spouse.  Pill planner provided.   [facility] CM Short Term Goal #2   Pt would call PCP office to schedule follow up appointment within the next 6 days   [facility] CM Short Term Goal #2 Start Date  05/31/17  [facility] CM Short Term Goal #2 Met Date  06/05/17  Interventions for Short Term Goal #2  Discussed with pt upcoming visit with PCP 06/12/17      [name].   [place] Care Management  [phone]

## 2017-06-07 ENCOUNTER — Other Ambulatory Visit: Payer: Self-pay | Admitting: *Deleted

## 2017-06-07 ENCOUNTER — Telehealth: Payer: Self-pay | Admitting: Family Medicine

## 2017-06-07 DIAGNOSIS — J439 Emphysema, unspecified: Secondary | ICD-10-CM | POA: Diagnosis not present

## 2017-06-07 DIAGNOSIS — F17210 Nicotine dependence, cigarettes, uncomplicated: Secondary | ICD-10-CM | POA: Diagnosis not present

## 2017-06-07 DIAGNOSIS — M1990 Unspecified osteoarthritis, unspecified site: Secondary | ICD-10-CM | POA: Diagnosis not present

## 2017-06-07 DIAGNOSIS — I129 Hypertensive chronic kidney disease with stage 1 through stage 4 chronic kidney disease, or unspecified chronic kidney disease: Secondary | ICD-10-CM | POA: Diagnosis not present

## 2017-06-07 DIAGNOSIS — K219 Gastro-esophageal reflux disease without esophagitis: Secondary | ICD-10-CM | POA: Diagnosis not present

## 2017-06-07 DIAGNOSIS — I2510 Atherosclerotic heart disease of native coronary artery without angina pectoris: Secondary | ICD-10-CM | POA: Diagnosis not present

## 2017-06-07 DIAGNOSIS — F329 Major depressive disorder, single episode, unspecified: Secondary | ICD-10-CM | POA: Diagnosis not present

## 2017-06-07 DIAGNOSIS — N183 Chronic kidney disease, stage 3 (moderate): Secondary | ICD-10-CM | POA: Diagnosis not present

## 2017-06-07 DIAGNOSIS — G4733 Obstructive sleep apnea (adult) (pediatric): Secondary | ICD-10-CM | POA: Diagnosis not present

## 2017-06-07 NOTE — Patient Outreach (Signed)
[place] [facility]) Care Management  06/07/2017  Katherine [name] 04-17-1940 [id number]   [employer] pt  discussed in [facility] difficult case discussion today.   RN CM following pt for  Chronic disease management(COPD),recent hospitalization 4/13-1/14/19 for COPD exacerbation.  High risk for readmission due to 2 admits within 30 days, 4 admits in 6 months.  Home visit done by RN CM 06/05/17, to continue to  Follow with Community CM services- follow up call within next 2 weeks, home  Visit next month.    [name].   [place] Care Management  [phone]

## 2017-06-07 NOTE — Telephone Encounter (Signed)
Verbal orders given  

## 2017-06-07 NOTE — Telephone Encounter (Signed)
Copied from [place] [phone]. Topic: Quick Communication - See Telephone Encounter >> Jun 04, 2017 11:38 AM Aurelio Brash B wrote: CRM for notification. See Telephone encounter for: 06/04/17. Verbal order for home health    PT   2x  a week for 3 weeks,        [phone] >> Jun 06, 2017 12:57 PM Darl Householder, RMA wrote: [place] from [facility] calling to check status on verbal orders please return call to [phone]

## 2017-06-07 NOTE — Telephone Encounter (Signed)
OK to give verbal orders 

## 2017-06-08 ENCOUNTER — Other Ambulatory Visit: Payer: Self-pay | Admitting: *Deleted

## 2017-06-08 DIAGNOSIS — F329 Major depressive disorder, single episode, unspecified: Secondary | ICD-10-CM | POA: Diagnosis not present

## 2017-06-08 DIAGNOSIS — M1990 Unspecified osteoarthritis, unspecified site: Secondary | ICD-10-CM | POA: Diagnosis not present

## 2017-06-08 DIAGNOSIS — I2510 Atherosclerotic heart disease of native coronary artery without angina pectoris: Secondary | ICD-10-CM | POA: Diagnosis not present

## 2017-06-08 DIAGNOSIS — F17210 Nicotine dependence, cigarettes, uncomplicated: Secondary | ICD-10-CM | POA: Diagnosis not present

## 2017-06-08 DIAGNOSIS — J439 Emphysema, unspecified: Secondary | ICD-10-CM | POA: Diagnosis not present

## 2017-06-08 DIAGNOSIS — N183 Chronic kidney disease, stage 3 (moderate): Secondary | ICD-10-CM | POA: Diagnosis not present

## 2017-06-08 DIAGNOSIS — I129 Hypertensive chronic kidney disease with stage 1 through stage 4 chronic kidney disease, or unspecified chronic kidney disease: Secondary | ICD-10-CM | POA: Diagnosis not present

## 2017-06-08 DIAGNOSIS — K219 Gastro-esophageal reflux disease without esophagitis: Secondary | ICD-10-CM | POA: Diagnosis not present

## 2017-06-08 DIAGNOSIS — G4733 Obstructive sleep apnea (adult) (pediatric): Secondary | ICD-10-CM | POA: Diagnosis not present

## 2017-06-08 NOTE — Patient Outreach (Signed)
[place] [facility]) Care Management  06/08/2017  Katherine [name] 10-06-1940 [id number]   Received a call from spouse [name], [place] identifiers provided on pt. Spouse  reports pt did not  rest good last night, breathing hard lying down,  noticed big machine not working  good, put pt on portable O 2 machine 2 liters [place] breathing better,battery full.   RN CM inquired of pt if called oxygen Supplier to which spouse reports was going to.    Plan:  As discussed, spouse to  follow up with [place]  About oxygen concentrator, call RN CM back about outcome.   [name].   [place] Care Management  [phone]

## 2017-06-12 ENCOUNTER — Emergency Department: Payer: Medicare HMO

## 2017-06-12 ENCOUNTER — Encounter: Payer: Self-pay | Admitting: Emergency Medicine

## 2017-06-12 ENCOUNTER — Other Ambulatory Visit: Payer: Self-pay

## 2017-06-12 ENCOUNTER — Inpatient Hospital Stay
Admission: EM | Admit: 2017-06-12 | Discharge: 2017-06-16 | DRG: 190 | Disposition: A | Discharge status: Home Health | Payer: Medicare HMO | Attending: Internal Medicine | Admitting: Internal Medicine

## 2017-06-12 ENCOUNTER — Inpatient Hospital Stay: Payer: Medicare HMO | Admitting: Unknown Physician Specialty

## 2017-06-12 DIAGNOSIS — Z96651 Presence of right artificial knee joint: Secondary | ICD-10-CM | POA: Diagnosis present

## 2017-06-12 DIAGNOSIS — G4733 Obstructive sleep apnea (adult) (pediatric): Secondary | ICD-10-CM | POA: Diagnosis present

## 2017-06-12 DIAGNOSIS — Z91030 Bee allergy status: Secondary | ICD-10-CM

## 2017-06-12 DIAGNOSIS — I129 Hypertensive chronic kidney disease with stage 1 through stage 4 chronic kidney disease, or unspecified chronic kidney disease: Secondary | ICD-10-CM | POA: Diagnosis present

## 2017-06-12 DIAGNOSIS — Z832 Family history of diseases of the blood and blood-forming organs and certain disorders involving the immune mechanism: Secondary | ICD-10-CM

## 2017-06-12 DIAGNOSIS — N179 Acute kidney failure, unspecified: Secondary | ICD-10-CM | POA: Diagnosis present

## 2017-06-12 DIAGNOSIS — Z7982 Long term (current) use of aspirin: Secondary | ICD-10-CM

## 2017-06-12 DIAGNOSIS — E875 Hyperkalemia: Secondary | ICD-10-CM | POA: Diagnosis present

## 2017-06-12 DIAGNOSIS — J449 Chronic obstructive pulmonary disease, unspecified: Secondary | ICD-10-CM | POA: Diagnosis not present

## 2017-06-12 DIAGNOSIS — F329 Major depressive disorder, single episode, unspecified: Secondary | ICD-10-CM | POA: Diagnosis present

## 2017-06-12 DIAGNOSIS — Z66 Do not resuscitate: Secondary | ICD-10-CM | POA: Diagnosis present

## 2017-06-12 DIAGNOSIS — Z87891 Personal history of nicotine dependence: Secondary | ICD-10-CM | POA: Diagnosis not present

## 2017-06-12 DIAGNOSIS — K219 Gastro-esophageal reflux disease without esophagitis: Secondary | ICD-10-CM | POA: Diagnosis present

## 2017-06-12 DIAGNOSIS — J9811 Atelectasis: Secondary | ICD-10-CM | POA: Diagnosis present

## 2017-06-12 DIAGNOSIS — J9621 Acute and chronic respiratory failure with hypoxia: Secondary | ICD-10-CM | POA: Diagnosis not present

## 2017-06-12 DIAGNOSIS — D649 Anemia, unspecified: Secondary | ICD-10-CM | POA: Diagnosis not present

## 2017-06-12 DIAGNOSIS — N183 Chronic kidney disease, stage 3 (moderate): Secondary | ICD-10-CM | POA: Diagnosis present

## 2017-06-12 DIAGNOSIS — J189 Pneumonia, unspecified organism: Secondary | ICD-10-CM

## 2017-06-12 DIAGNOSIS — E559 Vitamin D deficiency, unspecified: Secondary | ICD-10-CM | POA: Diagnosis present

## 2017-06-12 DIAGNOSIS — Z9981 Dependence on supplemental oxygen: Secondary | ICD-10-CM

## 2017-06-12 DIAGNOSIS — J441 Chronic obstructive pulmonary disease with (acute) exacerbation: Principal | ICD-10-CM | POA: Diagnosis present

## 2017-06-12 DIAGNOSIS — Z888 Allergy status to other drugs, medicaments and biological substances status: Secondary | ICD-10-CM | POA: Diagnosis not present

## 2017-06-12 DIAGNOSIS — R05 Cough: Secondary | ICD-10-CM | POA: Diagnosis not present

## 2017-06-12 DIAGNOSIS — Z8249 Family history of ischemic heart disease and other diseases of the circulatory system: Secondary | ICD-10-CM

## 2017-06-12 DIAGNOSIS — R1110 Vomiting, unspecified: Secondary | ICD-10-CM | POA: Diagnosis not present

## 2017-06-12 DIAGNOSIS — R0602 Shortness of breath: Secondary | ICD-10-CM | POA: Diagnosis not present

## 2017-06-12 DIAGNOSIS — I10 Essential (primary) hypertension: Secondary | ICD-10-CM | POA: Diagnosis not present

## 2017-06-12 LAB — CBC WITH DIFFERENTIAL/PLATELET
BASOS ABS: 0 10*3/uL (ref 0–0.1)
Basophils Relative: 1 %
EOS PCT: 3 %
Eosinophils Absolute: 0.1 10*3/uL (ref 0–0.7)
HCT: 38.3 % (ref 35.0–47.0)
HEMOGLOBIN: 12.3 g/dL (ref 12.0–16.0)
LYMPHS ABS: 1.6 10*3/uL (ref 1.0–3.6)
LYMPHS PCT: 29 %
MCH: 31.1 pg (ref 26.0–34.0)
MCHC: 32.2 g/dL (ref 32.0–36.0)
MCV: 96.5 fL (ref 80.0–100.0)
Monocytes Absolute: 0.5 10*3/uL (ref 0.2–0.9)
Monocytes Relative: 9 %
NEUTROS ABS: 3.2 10*3/uL (ref 1.4–6.5)
Neutrophils Relative %: 58 %
PLATELETS: 209 10*3/uL (ref 150–440)
RBC: 3.97 MIL/uL (ref 3.80–5.20)
RDW: 16 % — ABNORMAL HIGH (ref 11.5–14.5)
WBC: 5.5 10*3/uL (ref 3.6–11.0)

## 2017-06-12 LAB — BASIC METABOLIC PANEL
ANION GAP: 6 (ref 5–15)
BUN: 20 mg/dL (ref 6–20)
CHLORIDE: 114 mmol/L — AB (ref 101–111)
CO2: 23 mmol/L (ref 22–32)
Calcium: 7.5 mg/dL — ABNORMAL LOW (ref 8.9–10.3)
Creatinine, Ser: 1.49 mg/dL — ABNORMAL HIGH (ref 0.44–1.00)
GFR calc Af Amer: 38 mL/min — ABNORMAL LOW (ref >60)
GFR, EST NON AFRICAN AMERICAN: 33 mL/min — AB (ref >60)
GLUCOSE: 130 mg/dL — AB (ref 65–99)
Potassium: 3.9 mmol/L (ref 3.5–5.1)
SODIUM: 143 mmol/L (ref 135–145)

## 2017-06-12 LAB — BLOOD GAS, ARTERIAL
Acid-base deficit: 2.8 mmol/L — ABNORMAL HIGH (ref 0.0–2.0)
BICARBONATE: 23.2 mmol/L (ref 20.0–28.0)
FIO2: 0.28
O2 Saturation: 95.8 %
PH ART: 7.33 — AB (ref 7.350–7.450)
PO2 ART: 86 mmHg (ref 83.0–108.0)
Patient temperature: 37
pCO2 arterial: 44 mmHg (ref 32.0–48.0)

## 2017-06-12 LAB — GLUCOSE, CAPILLARY: GLUCOSE-CAPILLARY: 145 mg/dL — AB (ref 65–99)

## 2017-06-12 LAB — MRSA PCR SCREENING: MRSA by PCR: NEGATIVE

## 2017-06-12 LAB — TROPONIN I

## 2017-06-12 MED ORDER — LORATADINE 10 MG PO TABS
10.0000 mg | ORAL_TABLET | Freq: Every day | ORAL | Status: DC
  Administered 2017-06-12 – 2017-06-16 (×5): 10 mg via ORAL
  Filled 2017-06-12 (×5): qty 1

## 2017-06-12 MED ORDER — IPRATROPIUM-ALBUTEROL 0.5-2.5 (3) MG/3ML IN SOLN
3.0000 mL | Freq: Once | RESPIRATORY_TRACT | Status: AC
  Administered 2017-06-12: 3 mL via RESPIRATORY_TRACT

## 2017-06-12 MED ORDER — PANTOPRAZOLE SODIUM 40 MG PO TBEC: 40.0000 mg | DELAYED_RELEASE_TABLET | Freq: Every day | ORAL | Status: DC | PRN

## 2017-06-12 MED ORDER — GUAIFENESIN 100 MG/5ML PO SOLN
5.0000 mL | ORAL | Status: DC | PRN
  Administered 2017-06-13 – 2017-06-14 (×2): 100 mg via ORAL
  Filled 2017-06-12 (×3): qty 5

## 2017-06-12 MED ORDER — BUDESONIDE 0.25 MG/2ML IN SUSP
0.2500 mg | Freq: Two times a day (BID) | RESPIRATORY_TRACT | Status: DC
  Administered 2017-06-12 – 2017-06-16 (×8): 0.25 mg via RESPIRATORY_TRACT
  Filled 2017-06-12 (×8): qty 2

## 2017-06-12 MED ORDER — HYDRALAZINE HCL 50 MG PO TABS
25.0000 mg | ORAL_TABLET | Freq: Two times a day (BID) | ORAL | Status: DC
  Administered 2017-06-12 – 2017-06-16 (×9): 25 mg via ORAL
  Filled 2017-06-12 (×9): qty 1

## 2017-06-12 MED ORDER — METHYLPREDNISOLONE SODIUM SUCC 125 MG IJ SOLR
125.0000 mg | Freq: Once | INTRAMUSCULAR | Status: AC
  Administered 2017-06-12: 125 mg via INTRAVENOUS
  Filled 2017-06-12: qty 2

## 2017-06-12 MED ORDER — LORAZEPAM 2 MG/ML IJ SOLN
1.0000 mg | Freq: Once | INTRAMUSCULAR | Status: AC
  Administered 2017-06-12: 1 mg via INTRAVENOUS
  Filled 2017-06-12: qty 1

## 2017-06-12 MED ORDER — VITAMIN D 1000 UNITS PO TABS
1000.0000 [IU] | ORAL_TABLET | Freq: Every day | ORAL | Status: DC
  Administered 2017-06-12 – 2017-06-16 (×5): 1000 [IU] via ORAL
  Filled 2017-06-12 (×6): qty 1

## 2017-06-12 MED ORDER — ACETAMINOPHEN 650 MG RE SUPP: 650.0000 mg | Freq: Four times a day (QID) | RECTAL | Status: DC | PRN

## 2017-06-12 MED ORDER — METHYLPREDNISOLONE SODIUM SUCC 125 MG IJ SOLR: 60.0000 mg | Freq: Four times a day (QID) | INTRAMUSCULAR | Status: DC

## 2017-06-12 MED ORDER — VANCOMYCIN HCL IN DEXTROSE 1-5 GM/200ML-% IV SOLN
1000.0000 mg | Freq: Once | INTRAVENOUS | Status: DC
  Filled 2017-06-12: qty 200

## 2017-06-12 MED ORDER — ALBUTEROL SULFATE (2.5 MG/3ML) 0.083% IN NEBU: 2.5000 mg | INHALATION_SOLUTION | RESPIRATORY_TRACT | Status: DC | PRN

## 2017-06-12 MED ORDER — HYDROCODONE-ACETAMINOPHEN 5-325 MG PO TABS: 1.0000 | ORAL_TABLET | ORAL | Status: DC | PRN

## 2017-06-12 MED ORDER — ONDANSETRON HCL 4 MG PO TABS: 4.0000 mg | ORAL_TABLET | Freq: Four times a day (QID) | ORAL | Status: DC | PRN

## 2017-06-12 MED ORDER — ASPIRIN EC 81 MG PO TBEC
81.0000 mg | DELAYED_RELEASE_TABLET | Freq: Every day | ORAL | Status: DC
  Administered 2017-06-12 – 2017-06-16 (×5): 81 mg via ORAL
  Filled 2017-06-12 (×5): qty 1

## 2017-06-12 MED ORDER — HEPARIN SODIUM (PORCINE) 5000 UNIT/ML IJ SOLN
5000.0000 [IU] | Freq: Three times a day (TID) | INTRAMUSCULAR | Status: DC
  Administered 2017-06-12 – 2017-06-16 (×9): 5000 [IU] via SUBCUTANEOUS
  Filled 2017-06-12 (×11): qty 1

## 2017-06-12 MED ORDER — BISACODYL 5 MG PO TBEC: 5.0000 mg | DELAYED_RELEASE_TABLET | Freq: Every day | ORAL | Status: DC | PRN

## 2017-06-12 MED ORDER — DILTIAZEM HCL ER COATED BEADS 120 MG PO CP24
120.0000 mg | ORAL_CAPSULE | Freq: Every day | ORAL | Status: DC
  Administered 2017-06-12 – 2017-06-16 (×5): 120 mg via ORAL
  Filled 2017-06-12 (×5): qty 1

## 2017-06-12 MED ORDER — IPRATROPIUM-ALBUTEROL 0.5-2.5 (3) MG/3ML IN SOLN
RESPIRATORY_TRACT | Status: AC
  Administered 2017-06-12: 3 mL via RESPIRATORY_TRACT
  Filled 2017-06-12: qty 9

## 2017-06-12 MED ORDER — PIPERACILLIN-TAZOBACTAM 3.375 G IVPB
3.3750 g | Freq: Three times a day (TID) | INTRAVENOUS | Status: DC
  Administered 2017-06-12 – 2017-06-14 (×6): 3.375 g via INTRAVENOUS
  Filled 2017-06-12 (×6): qty 50

## 2017-06-12 MED ORDER — MAGNESIUM SULFATE 2 GM/50ML IV SOLN
2.0000 g | Freq: Once | INTRAVENOUS | Status: AC
  Administered 2017-06-12: 2 g via INTRAVENOUS
  Filled 2017-06-12: qty 50

## 2017-06-12 MED ORDER — TIOTROPIUM BROMIDE MONOHYDRATE 18 MCG IN CAPS
18.0000 ug | ORAL_CAPSULE | Freq: Every morning | RESPIRATORY_TRACT | Status: DC
  Administered 2017-06-12 – 2017-06-16 (×5): 18 ug via RESPIRATORY_TRACT
  Filled 2017-06-12 (×3): qty 5

## 2017-06-12 MED ORDER — ONDANSETRON HCL 4 MG/2ML IJ SOLN
4.0000 mg | Freq: Four times a day (QID) | INTRAMUSCULAR | Status: DC | PRN
  Administered 2017-06-15: 10:00:00 4 mg via INTRAVENOUS
  Filled 2017-06-12: qty 2

## 2017-06-12 MED ORDER — PANTOPRAZOLE SODIUM 40 MG PO TBEC
40.0000 mg | DELAYED_RELEASE_TABLET | Freq: Every day | ORAL | Status: DC
  Administered 2017-06-12 – 2017-06-16 (×5): 40 mg via ORAL
  Filled 2017-06-12 (×5): qty 1

## 2017-06-12 MED ORDER — PREDNISONE 20 MG PO TABS
40.0000 mg | ORAL_TABLET | Freq: Every day | ORAL | Status: DC
  Administered 2017-06-13 – 2017-06-16 (×4): 40 mg via ORAL
  Filled 2017-06-12 (×4): qty 2

## 2017-06-12 MED ORDER — SENNOSIDES-DOCUSATE SODIUM 8.6-50 MG PO TABS: 1.0000 | ORAL_TABLET | Freq: Every evening | ORAL | Status: DC | PRN

## 2017-06-12 MED ORDER — HYDROCOD POLST-CPM POLST ER 10-8 MG/5ML PO SUER
5.0000 mL | Freq: Two times a day (BID) | ORAL | Status: DC | PRN
  Administered 2017-06-13: 5 mL via ORAL
  Filled 2017-06-12: qty 5

## 2017-06-12 MED ORDER — IPRATROPIUM-ALBUTEROL 0.5-2.5 (3) MG/3ML IN SOLN
3.0000 mL | Freq: Four times a day (QID) | RESPIRATORY_TRACT | Status: DC
  Administered 2017-06-12 – 2017-06-14 (×8): 3 mL via RESPIRATORY_TRACT
  Filled 2017-06-12 (×8): qty 3

## 2017-06-12 MED ORDER — OMEPRAZOLE MAGNESIUM 20 MG PO TBEC: 20.0000 mg | DELAYED_RELEASE_TABLET | Freq: Every day | ORAL | Status: DC | PRN

## 2017-06-12 MED ORDER — ORAL CARE MOUTH RINSE
15.0000 mL | Freq: Two times a day (BID) | OROMUCOSAL | Status: DC
  Administered 2017-06-12 – 2017-06-16 (×6): 15 mL via OROMUCOSAL

## 2017-06-12 MED ORDER — SODIUM CHLORIDE 0.9 % IV SOLN
500.0000 mg | Freq: Once | INTRAVENOUS | Status: AC
  Administered 2017-06-12: 500 mg via INTRAVENOUS
  Filled 2017-06-12: qty 500

## 2017-06-12 MED ORDER — ALBUTEROL SULFATE (2.5 MG/3ML) 0.083% IN NEBU
2.5000 mg | INHALATION_SOLUTION | Freq: Once | RESPIRATORY_TRACT | Status: AC
  Administered 2017-06-12: 2.5 mg via RESPIRATORY_TRACT
  Filled 2017-06-12: qty 3

## 2017-06-12 MED ORDER — FLUOXETINE HCL 10 MG PO CAPS
10.0000 mg | ORAL_CAPSULE | Freq: Every day | ORAL | Status: DC
  Administered 2017-06-12 – 2017-06-16 (×5): 10 mg via ORAL
  Filled 2017-06-12 (×5): qty 1

## 2017-06-12 MED ORDER — ACETAMINOPHEN 325 MG PO TABS: 650.0000 mg | ORAL_TABLET | Freq: Four times a day (QID) | ORAL | Status: DC | PRN

## 2017-06-12 NOTE — ED Notes (Signed)
Respiratory called for transportation assistance.

## 2017-06-12 NOTE — Consult Note (Signed)
Name: Katherine [name] MRN: [id number] DOB: 1940/11/13     CONSULTATION DATE: 06/12/2017  HISTORY OF PRESENT ILLNESS:  78 y old lady with history of COPD, chronic respiratory failure on home O2 [place] 2 L/minm HTN, anxiety, depression, osteoarthritis, vit D deficiency, GERD and chronic Tobacco abuse. Patient was recently discharged from the hospital with diagnosis of COPD exacerbation. Today she presented to ED with SOB, respiratory distress and was started on BiPAP, ABX, systemic steroids. All history was obtained from PCP, the patient and EMR. Patient arrived to ICU, awake, on [place] and in no distress.  PAST MEDICAL HISTORY :   has a past medical history of Angioedema, Anxiety, Anxiety and depression, Chronic constipation, Chronic kidney disease, COPD (chronic obstructive pulmonary disease) ([place]), Gallstones, GERD (gastroesophageal reflux disease), Hypertension, Left ventricular hypertrophy, Osteoarthritis, Prurigo nodularis, Tobacco abuse, and Vitamin D deficiency disease.  has a past surgical history that includes Cholecystectomy; Polypectomy (11/2011); and Total knee arthroplasty (Right, 02/08/2016). Prior to Admission medications   Medication Sig Start Date End Date Taking? Authorizing Provider  acetaminophen (TYLENOL) 650 MG CR tablet Take 650-1,300 mg by mouth every 8 (eight) hours as needed for pain.    Yes [provider]  albuterol (PROVENTIL HFA) 108 (90 Base) MCG/ACT inhaler Inhale 2 puffs into the lungs every 4 (four) hours as needed for wheezing or shortness of breath. 05/30/17  Yes Wieting, Richard, MD  aspirin EC 81 MG tablet Take 81 mg by mouth daily.   Yes [provider]  cetirizine (ZYRTEC) 10 MG tablet TAKE 1/2 TABLET(5 MG) BY MOUTH AT BEDTIME 05/25/17  Yes [name], NP  cholecalciferol (VITAMIN D) 1000 units tablet Take 1,000 Units by mouth daily.   Yes [provider]  diltiazem (CARDIZEM CD) 120 MG 24 hr capsule Take 1 capsule (120 mg total) by  mouth daily. 05/30/17 05/30/18 Yes Wieting, Richard, MD  FLUoxetine (PROZAC) 10 MG capsule TAKE 1 CAPSULE(10 MG) BY MOUTH DAILY 01/30/17  Yes [name], NP  hydrALAZINE (APRESOLINE) 25 MG tablet Take 1 tablet (25 mg total) by mouth 3 (three) times daily. Patient taking differently: Take 25 mg by mouth 2 (two) times daily.  05/30/17  Yes Wieting, Richard, MD  ipratropium-albuterol (DUONEB) 0.5-2.5 (3) MG/3ML SOLN Take 3 mLs by nebulization every 6 (six) hours as needed. Patient taking differently: Take 3 mLs by nebulization every 6 (six) hours as needed (shortness of breath and/or wheezing).  11/29/15  Yes [name], MD  omeprazole (PRILOSEC OTC) 20 MG tablet Take 20 mg by mouth daily as needed (Heartburn).    Yes [provider]  SPIRIVA HANDIHALER 18 MCG inhalation capsule PLACE 1 CAPSULE INTO INHALER AND INHALE EVERY DAY 04/25/17  Yes Volney American, PA-C  fluticasone furoate-vilanterol (BREO ELLIPTA) 100-25 MCG/INH AEPB Inhale 1 puff into the lungs daily. Patient not taking: Reported on 06/05/2017 04/27/16   [name], MD  guaiFENesin (ROBITUSSIN) 100 MG/5ML SOLN Take 5 mLs (100 mg total) by mouth every 4 (four) hours as needed for cough or to loosen phlegm. Patient not taking: Reported on 06/12/2017 05/21/17   Demetrios Loll, MD  predniSONE (DELTASONE) 10 MG tablet Take 3 tabs po day1; 2 tabs po day2; 1 tab po day 3 then stop Patient not taking: Reported on 06/12/2017 05/24/17   Loletha Grayer, MD   Allergies  Allergen Reactions  . Bee Venom Swelling  . Enalapril Maleate Swelling  . Other     Bee stings    FAMILY HISTORY:  family  history includes Alcohol abuse in her mother; Hypertension in her son; Sickle cell anemia in her daughter. SOCIAL HISTORY:  reports that she has quit smoking. Her smoking use included cigarettes. She has a 30.00 pack-year smoking history. She has never used smokeless tobacco. She reports that she does not drink alcohol or use drugs.  REVIEW  OF SYSTEMS:   Unable to obtain due to critical illness   VITAL SIGNS: Temp:  [98 F (36.7 C)-98.3 F (36.8 C)] 98.1 F (36.7 C) (04/30 1114) Pulse Rate:  [85-99] 88 (04/30 1100) Resp:  [14-38] 20 (04/30 1100) BP: (96-144)/(56-96) 144/96 (04/30 1114) SpO2:  [95 %-99 %] 95 % (04/30 1100) Weight:  [113.3 kg (249 lb 12.5 oz)-114.3 kg (252 lb)] 113.3 kg (249 lb 12.5 oz) (04/30 0900)  Physical Examination:  A + O and no acute neuro deficits On [place], no distress, able to talk in full sentences. BEAE and diffuse expiratory wheezes S1 & S2 audible and no murmur Benign abdomen with normal peristalses No leg edema ASSESSMENT / PLAN:  Acute on chronic respiratory failure with distress (improved), base line home O2 [place] 2 /min, off BiPAP and  Tolerating [place].  Questionable OSA suggested by the patient body habitus -Monitor nocturnal O2 sat, work of breathing and consider sleep study with discharge plae.  COPD exacerbation -Zosyn + Bronchodilators + tapering sterids + inhaled steroids   Atelectasis and pneumonia. Bibasilar airspace disease -Possible HCAP, c/w Zosyn and d/c Vanc MRSA PCR -ve.  Anxiety and depression -Supportive care  CKD -Avoid nephrotoxins, monitor renal panel and urine out put.  HTN -Optimize antihypertensives and monitor hemodynamics  Osteoarthritis, Vit D deficiency  s/p Rt. Knee arthroplasty -Vit D, analgesia and supportive care  Full code  DVT & GI prophylaxis. Continue with supportive care  Critical care time 40 min

## 2017-06-12 NOTE — ED Notes (Signed)
Pt placed in hosp gown & on card monitor; Dr Dahlia Client at bedside; RT called for bipap; neb initiated

## 2017-06-12 NOTE — Progress Notes (Signed)
Pharmacy Antibiotic Note  Lisl Slingerland is a 77 y.o. female admitted on 06/12/2017 with pneumonia.  Pharmacy has been consulted for Zosyn dosing. Vancomycin d/c with negative MRSA PCR.   Plan: Zosyn 3.375g IV q8h (4 hour infusion).  Height: 5\' 1"  (154.9 cm) Weight: 249 lb 12.5 oz (113.3 kg) IBW/kg (Calculated) : 47.8  Temp (24hrs), Avg:98.1 F (36.7 C), Min:98 F (36.7 C), Max:98.3 F (36.8 C)  Recent Labs  Lab 06/12/17 0701  WBC 5.5  CREATININE 1.49*    Estimated Creatinine Clearance: 38.1 mL/min (A) (by C-G formula based on SCr of 1.49 mg/dL (H)).    Allergies  Allergen Reactions  . Bee Venom Swelling  . Enalapril Maleate Swelling  . Other     Bee stings    Antimicrobials this admission: azithromycin 4/30 x 1 Zosyn 4/30 >>   Dose adjustments this admission:   Microbiology results: 4/30 MRSA PCR: negative  Thank you for allowing pharmacy to be a part of this patient's care.  Ulice Dash D 06/12/2017 1:39 PM

## 2017-06-12 NOTE — H&P (Addendum)
[facility] [place] NAME: Katherine [name]    MR#:  [id number]  DATE OF BIRTH:  [birth year]  DATE OF ADMISSION:  06/12/2017  PRIMARY CARE PHYSICIAN: Katherine Haddock, NP   REQUESTING/REFERRING PHYSICIAN: Dr. Dahlia Client  CHIEF COMPLAINT:   Chief Complaint  Patient presents with  . Cough   Worsening shortness of breath, wheezing and the cough since last night HISTORY OF PRESENT ILLNESS:  Katherine [name]  is a 77 y.o. female with a known history of multiple medical problems as below.  The patient was recently admitted for COPD exacerbation and discharged home.  She finished her prednisone.  She has had worsening shortness of breath, cough and wheezing all night long since yesterday.  She is in respiratory distress and is found hypoxia in the ED. she is treated with nebulizer multiple times without improvement and put on BiPAP.  He denies other symptoms.  PAST MEDICAL HISTORY:   Past Medical History:  Diagnosis Date  . Angioedema   . Anxiety   . Anxiety and depression   . Chronic constipation   . Chronic kidney disease    stage 3  . COPD (chronic obstructive pulmonary disease) ([place])   . Gallstones   . GERD (gastroesophageal reflux disease)   . Hypertension   . Left ventricular hypertrophy   . Osteoarthritis   . Prurigo nodularis   . Tobacco abuse   . Vitamin D deficiency disease     PAST SURGICAL HISTORY:   Past Surgical History:  Procedure Laterality Date  . CHOLECYSTECTOMY    . 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   Procedure: TOTAL KNEE ARTHROPLASTY;  Surgeon: Hessie Knows, MD;  Location: [facility] ORS;  Service: Orthopedics;  Laterality: Right;    SOCIAL HISTORY:   Social History   Tobacco Use  . Smoking status: Former Smoker    Packs/day: 0.50    Years: 60.00    Pack years: 30.00    Types: Cigarettes  . Smokeless tobacco: Never Used  Substance Use Topics  . Alcohol use: No   Alcohol/week: 0.0 oz    Comment: rare    FAMILY HISTORY:   Family History  Problem Relation Age of Onset  . Alcohol abuse Mother   . Sickle cell anemia Daughter   . Hypertension Son   . Cancer Neg Hx   . COPD Neg Hx   . Diabetes Neg Hx   . Heart disease Neg Hx   . Stroke Neg Hx     DRUG ALLERGIES:   Allergies  Allergen Reactions  . Bee Venom Swelling  . Enalapril Maleate Swelling  . Other     Bee stings    REVIEW OF SYSTEMS:   Review of Systems  Constitutional: Negative for chills, fever and malaise/fatigue.  HENT: Negative for sore throat.   Eyes: Negative for blurred vision and double vision.  Respiratory: Positive for cough, sputum production, shortness of breath and wheezing. Negative for hemoptysis and stridor.   Cardiovascular: Negative for chest pain, palpitations, orthopnea and leg swelling.  Gastrointestinal: Negative for abdominal pain, blood in stool, diarrhea, melena, nausea and vomiting.  Genitourinary: Negative for dysuria, flank pain and hematuria.  Musculoskeletal: Negative for back pain and joint pain.  Skin: Negative for rash.  Neurological: Negative for dizziness, sensory change, focal weakness, seizures, loss of consciousness, weakness and headaches.  Endo/Heme/Allergies: Negative for polydipsia.  Psychiatric/Behavioral: Negative for depression. The patient is not nervous/anxious.  MEDICATIONS AT HOME:   Prior to Admission medications   Medication Sig Start Date End Date Taking? Authorizing Provider  acetaminophen (TYLENOL) 650 MG CR tablet Take 650-1,300 mg by mouth every 8 (eight) hours as needed for pain.    Yes [provider]  albuterol (PROVENTIL HFA) 108 (90 Base) MCG/ACT inhaler Inhale 2 puffs into the lungs every 4 (four) hours as needed for wheezing or shortness of breath. 05/30/17  Yes Wieting, Richard, MD  aspirin EC 81 MG tablet Take 81 mg by mouth daily.   Yes [provider]  cetirizine (ZYRTEC) 10 MG tablet  TAKE 1/2 TABLET(5 MG) BY MOUTH AT BEDTIME 05/25/17  Yes Katherine Haddock, NP  cholecalciferol (VITAMIN D) 1000 units tablet Take 1,000 Units by mouth daily.   Yes [provider]  diltiazem (CARDIZEM CD) 120 MG 24 hr capsule Take 1 capsule (120 mg total) by mouth daily. 05/30/17 05/30/18 Yes Wieting, Richard, MD  FLUoxetine (PROZAC) 10 MG capsule TAKE 1 CAPSULE(10 MG) BY MOUTH DAILY 01/30/17  Yes Katherine Haddock, NP  hydrALAZINE (APRESOLINE) 25 MG tablet Take 1 tablet (25 mg total) by mouth 3 (three) times daily. Patient taking differently: Take 25 mg by mouth 2 (two) times daily.  05/30/17  Yes Wieting, Richard, MD  ipratropium-albuterol (DUONEB) 0.5-2.5 (3) MG/3ML SOLN Take 3 mLs by nebulization every 6 (six) hours as needed. Patient taking differently: Take 3 mLs by nebulization every 6 (six) hours as needed (shortness of breath and/or wheezing).  11/29/15  Yes [name], MD  omeprazole (PRILOSEC OTC) 20 MG tablet Take 20 mg by mouth daily as needed (Heartburn).    Yes [provider]  SPIRIVA HANDIHALER 18 MCG inhalation capsule PLACE 1 CAPSULE INTO INHALER AND INHALE EVERY DAY 04/25/17  Yes Volney American, PA-C  fluticasone furoate-vilanterol (BREO ELLIPTA) 100-25 MCG/INH AEPB Inhale 1 puff into the lungs daily. Patient not taking: Reported on 06/05/2017 04/27/16   [name], MD  guaiFENesin (ROBITUSSIN) 100 MG/5ML SOLN Take 5 mLs (100 mg total) by mouth every 4 (four) hours as needed for cough or to loosen phlegm. Patient not taking: Reported on 06/12/2017 05/21/17   Demetrios Loll, MD  predniSONE (DELTASONE) 10 MG tablet Take 3 tabs po day1; 2 tabs po day2; 1 tab po day 3 then stop Patient not taking: Reported on 06/12/2017 05/24/17   Loletha Grayer, MD      VITAL SIGNS:  Blood pressure (!) 96/56, pulse 85, temperature 98.3 F (36.8 C), temperature source Oral, resp. rate (!) 23, height 5\' 7"  (1.702 m), weight 252 lb (114.3 kg), SpO2 96 %.  PHYSICAL EXAMINATION:    Physical Exam  GENERAL:  77 y.o.-year-old patient lying in the bed with no acute distress. Obesity. EYES: Pupils equal, round, reactive to light and accommodation. No scleral icterus. Extraocular muscles intact.  HEENT: Head atraumatic, normocephalic. Oropharynx and nasopharynx clear.  NECK:  Supple, no jugular venous distention. No thyroid enlargement, no tenderness.  LUNGS: Very diminished breath sounds bilaterally, mild wheezing and rhonchi. No use of accessory muscles of respiration.  CARDIOVASCULAR: S1, S2 normal. No murmurs, rubs, or gallops.  ABDOMEN: Soft, nontender, nondistended. Bowel sounds present. No organomegaly or mass.  EXTREMITIES: No pedal edema, cyanosis, or clubbing.  NEUROLOGIC: Cranial nerves II through XII are intact. Muscle strength 5/5 in all extremities. Sensation intact. Gait not checked.  PSYCHIATRIC: The patient is alert and oriented x 3.  SKIN: No obvious rash, lesion, or ulcer.   LABORATORY PANEL:   CBC  Recent Labs  Lab 06/12/17 0701  WBC 5.5  HGB 12.3  HCT 38.3  PLT 209   ------------------------------------------------------------------------------------------------------------------  Chemistries  No results for input(s): NA, K, CL, CO2, GLUCOSE, BUN, CREATININE, CALCIUM, MG, AST, ALT, ALKPHOS, BILITOT in the last 168 hours.  Invalid input(s): GFRCGP ------------------------------------------------------------------------------------------------------------------  Cardiac Enzymes No results for input(s): TROPONINI in the last 168 hours. ------------------------------------------------------------------------------------------------------------------  RADIOLOGY:  Dg Chest Portable 1 View  Result Date: 06/12/2017 CLINICAL DATA:  Shortness of breath and cough EXAM: PORTABLE CHEST 1 VIEW COMPARISON:  May 26, 2017 FINDINGS: There is no appreciable edema or consolidation. There is slight bibasilar atelectasis. Heart size and pulmonary vascularity  are normal. There is aortic atherosclerosis. No adenopathy. No bone lesions. IMPRESSION: Slight bibasilar atelectasis. No edema or consolidation. Stable cardiac silhouette. There is aortic atherosclerosis. Aortic Atherosclerosis (ICD10-I70.0). Electronically Signed   By: Lowella Grip III M.D.   On: 06/12/2017 07:26      IMPRESSION AND PLAN:   Acute on chronic respiratory failure with hypoxia due to COPD exacerbation. The patient will be admitted to stepdown unit. Continue BiPAP, IV Solu-Medrol, DuoNeb, continue Spiriva.  Robitussin as needed.  Intensivist consult.   Hypertension.  Continue home hypertension medication. CKD stage III.  Stable.  Discussed with Dr. Rosita Fire. All the records are reviewed and case discussed with ED provider. Management plans discussed with the patient, her husband and they are in agreement.  CODE STATUS: DNR  TOTAL CRITICAL TIME TAKING CARE OF THIS PATIENT: 56 minutes.    Demetrios Loll M.D on 06/12/2017 at 8:18 AM  Between 7am to 6pm - Pager - [phone]  After 6pm go to [email or web address] - Proofreader  [facility]  Office  [phone]  CC: Primary care physician; Katherine Haddock, NP   Note: This dictation was prepared with Dragon dictation along with smaller phrase technology. Any transcriptional errors that result from this process are unin

## 2017-06-12 NOTE — Care Management (Signed)
Patient presents from home with spouse. Patient is Katherine [name] patient with [facility] and also followed in the outpatient world by [facility]. Patient may benefit from NIV- please assess.  I have notified Corene Cornea with [facility] of patient presentation and potential need for NIV.  Patient has chronic O2 at home through [place].

## 2017-06-12 NOTE — Progress Notes (Signed)
Pt taken off bipap per patient request, patient stated she is nauseated, placed on 2lpm [place]. RN aware, will continue to monitor.

## 2017-06-12 NOTE — ED Triage Notes (Addendum)
Pt to triage via w/c, grunting resp; pt reports [facility] & cough since last night; O2 in place at 2l/min via [place]; denies pain

## 2017-06-12 NOTE — Progress Notes (Signed)
Pt was was coughing up blood at shift change this evening. Upon assessment it appeared to be blood coming from her nose that she had swallowed. Pt bed was elevated and humidified oxygen was administered. Hinton Dyer, NP, notified and agrees with interventions. Will continue to monitor for further bleeding.

## 2017-06-12 NOTE — ED Provider Notes (Addendum)
[facility] Emergency Department Provider Note   ____________________________________________   First MD Initiated Contact with Patient 06/12/17 [phone]     (approximate)  I have reviewed the triage vital signs and the nursing notes.   HISTORY  Chief Complaint Cough    HPI Katherine [name] is a 77 y.o. female who comes into the hospital today with a history of COPD.  The patient has been feeling short of breath all night long.  She used to nebs at home but it did not help.  The patient denies any fever but has had a nonproductive cough.  The patient denies chest pain.  She wears 2 L of oxygen at home normally.  The patient denies any sick contacts.  The patient was admitted to the hospital for COPD exacerbation approximately 2 weeks ago.  She is here today for reevaluation.  The patient has had some posttussive emesis and nausea.   Past Medical History:  Diagnosis Date  . Angioedema   . Anxiety   . Anxiety and depression   . Chronic constipation   . Chronic kidney disease    stage 3  . COPD (chronic obstructive pulmonary disease) ([place])   . Gallstones   . GERD (gastroesophageal reflux disease)   . Hypertension   . Left ventricular hypertrophy   . Osteoarthritis   . Prurigo nodularis   . Tobacco abuse   . Vitamin D deficiency disease     Patient Active Problem List   Diagnosis Date Noted  . COPD exacerbation ([place]) 04/20/2017  . COPD with exacerbation ([place]) 04/05/2017  . Hand pain 03/21/2017  . Dry skin 03/21/2017  . Pain in finger of left hand 09/12/2016  . Chronic fatigue 06/12/2016  . Left knee pain 06/12/2016  . Advanced care planning/counseling discussion 03/13/2016  . DNR (do not resuscitate) 03/13/2016  . Primary localized osteoarthritis of right knee 02/08/2016  . OSA and COPD overlap syndrome ([place]) 09/16/2015  . Rotator cuff syndrome 09/07/2015  . 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 Sleep disturbance 04/14/2015  . Coronary artery  disease 03/14/2015  . 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 Anxiety   . Tobacco abuse   . Prurigo nodularis   . Hypertensive heart disease   . GERD (gastroesophageal reflux disease)   . COPD (chronic obstructive pulmonary disease) ([place])   . Osteoarthritis   . Vitamin D deficiency disease   . Chronic constipation   . CKD (chronic kidney disease), stage III ([place])   . LVH (left ventricular hypertrophy) 05/20/2013  . Essential hypertension 05/20/2013  . Morbid obesity ([place]) 05/20/2013    Past Surgical History:  Procedure Laterality Date  . CHOLECYSTECTOMY    . 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   Procedure: TOTAL KNEE ARTHROPLASTY;  Surgeon: Hessie Knows, MD;  Location: [facility] ORS;  Service: Orthopedics;  Laterality: Right;    Prior to Admission medications   Medication Sig Start Date End Date Taking? Authorizing Provider  acetaminophen (TYLENOL) 650 MG CR tablet Take 650-1,300 mg by mouth every 8 (eight) hours as needed for pain.     [provider]  albuterol (PROVENTIL HFA) 108 (90 Base) MCG/ACT inhaler Inhale 2 puffs into the lungs every 4 (four) hours as needed for wheezing or shortness of breath. 05/30/17   Loletha Grayer, MD  aspirin EC 81 MG tablet Take 81 mg by mouth daily.    [provider]  cetirizine (  ZYRTEC) 10 MG tablet TAKE 1/2 TABLET(5 MG) BY MOUTH AT BEDTIME 05/25/17   [name], NP  cholecalciferol (VITAMIN D) 1000 units tablet Take 1,000 Units by mouth daily.    [provider]  diltiazem (CARDIZEM CD) 120 MG 24 hr capsule Take 1 capsule (120 mg total) by mouth daily. 05/30/17 05/30/18  Loletha Grayer, MD  FLUoxetine (PROZAC) 10 MG capsule TAKE 1 CAPSULE(10 MG) BY MOUTH DAILY 01/30/17   [name], NP  fluticasone furoate-vilanterol (BREO ELLIPTA) 100-25 MCG/INH AEPB Inhale 1 puff into the lungs daily. Patient not taking: Reported on 06/05/2017 04/27/16    [name], MD  guaiFENesin (ROBITUSSIN) 100 MG/5ML SOLN Take 5 mLs (100 mg total) by mouth every 4 (four) hours as needed for cough or to loosen phlegm. 05/21/17   Demetrios Loll, MD  hydrALAZINE (APRESOLINE) 25 MG tablet Take 1 tablet (25 mg total) by mouth 3 (three) times daily. 05/30/17   Loletha Grayer, MD  ipratropium-albuterol (DUONEB) 0.5-2.5 (3) MG/3ML SOLN Take 3 mLs by nebulization every 6 (six) hours as needed. Patient taking differently: Take 3 mLs by nebulization every 6 (six) hours as needed (shortness of breath and/or wheezing).  11/29/15   [name], MD  omeprazole (PRILOSEC OTC) 20 MG tablet Take 20 mg by mouth daily.    [provider]  omeprazole (PRILOSEC) 40 MG capsule Take 40 mg by mouth daily.    [provider]  predniSONE (DELTASONE) 10 MG tablet Take 3 tabs po day1; 2 tabs po day2; 1 tab po day 3 then stop 05/24/17   Loletha Grayer, MD  SPIRIVA HANDIHALER 18 MCG inhalation capsule PLACE 1 CAPSULE INTO INHALER AND INHALE EVERY DAY 04/25/17   Volney American, PA-C    Allergies Bee venom; Enalapril maleate; and Other  Family History  Problem Relation Age of Onset  . Alcohol abuse Mother   . Sickle cell anemia Daughter   . Hypertension Son   . Cancer Neg Hx   . COPD Neg Hx   . Diabetes Neg Hx   . Heart disease Neg Hx   . Stroke Neg Hx     Social History Social History   Tobacco Use  . Smoking status: Former Smoker    Packs/day: 0.50    Years: 60.00    Pack years: 30.00    Types: Cigarettes  . Smokeless tobacco: Never Used  Substance Use Topics  . Alcohol use: No    Alcohol/week: 0.0 oz    Comment: rare  . Drug use: No    Review of Systems  Constitutional: No fever/chills Eyes: No visual changes. ENT: No sore throat. Cardiovascular: Denies chest pain. Respiratory: Cough and shortness of breath. Gastrointestinal: Posttussive emesis with no abdominal pain.  No diarrhea.  No constipation. Genitourinary: Negative for  dysuria. Musculoskeletal: Negative for back pain. Skin: Negative for rash. Neurological: Negative for headaches, focal weakness or numbness.   ____________________________________________   PHYSICAL EXAM:  VITAL SIGNS: ED Triage Vitals  Enc Vitals Group     BP 06/12/17 0648 139/89     Pulse Rate 06/12/17 0648 97     Resp 06/12/17 0648 (!) 30     Temp 06/12/17 0648 98.3 F (36.8 C)     Temp Source 06/12/17 0648 Oral     SpO2 06/12/17 0648 99 %     Weight 06/12/17 0647 252 lb (114.3 kg)     Height 06/12/17 0647 5\' 7"  (1.702 m)     Head Circumference --  Peak Flow --      Pain Score 06/12/17 0647 0     Pain Loc --      Pain Edu? --      Excl. in [place]? --     Constitutional: Alert and oriented. Ill appearing and in moderate to severe respiratory distress. Eyes: Conjunctivae are normal. PERRL. EOMI. Head: Atraumatic. Nose: No congestion/rhinnorhea. Mouth/Throat: Mucous membranes are moist.  Oropharynx non-erythematous. Cardiovascular: Normal rate, regular rhythm. Grossly normal heart sounds.  Good peripheral circulation. Respiratory: Increased respiratory effort, patient grunting and coughing unable to get a good breath.  Patient with expiratory wheezes in the lung fields. Gastrointestinal: Soft and nontender. No distention.  Positive bowel sounds Musculoskeletal: No lower extremity tenderness nor edema.   Neurologic:  Normal speech and language.  Skin:  Skin is warm, dry and intact.  Psychiatric: Mood and affect are normal.   ____________________________________________   LABS (all labs ordered are listed, but only abnormal results are displayed)  Labs Reviewed  CBC WITH DIFFERENTIAL/PLATELET - Abnormal; Notable for the following components:      Result Value   RDW 16.0 (*)    All other components within normal limits  BASIC METABOLIC PANEL  TROPONIN I   ____________________________________________  EKG  ED ECG REPORT I, Loney Hering, the attending  physician, personally viewed and interpreted this ECG.   Date: 06/12/2017  EKG Time: 645  Rate: 102  Rhythm: sinus tachycardia  Axis: Normal  Intervals:none  ST&T Change: none  ____________________________________________  RADIOLOGY  ED MD interpretation: Chest x-ray: Slight bibasilar atelectasis, no edema or consolidation, stable cardiac silhouette.  Official radiology report(s): Dg Chest Portable 1 View  Result Date: 06/12/2017 CLINICAL DATA:  Shortness of breath and cough EXAM: PORTABLE CHEST 1 VIEW COMPARISON:  May 26, 2017 FINDINGS: There is no appreciable edema or consolidation. There is slight bibasilar atelectasis. Heart size and pulmonary vascularity are normal. There is aortic atherosclerosis. No adenopathy. No bone lesions. IMPRESSION: Slight bibasilar atelectasis. No edema or consolidation. Stable cardiac silhouette. There is aortic atherosclerosis. Aortic Atherosclerosis (ICD10-I70.0). Electronically Signed   By: Lowella Grip III M.D.   On: 06/12/2017 07:26    ____________________________________________   PROCEDURES  Procedure(s) performed: please, see procedure note(s).  .Critical Care Performed by: Loney Hering, MD Authorized by: Loney Hering, MD   Critical care provider statement:    Critical care time (minutes):  30   Critical care start time:  06/12/2017 7:00 AM   Critical care end time:  06/12/2017 7:30 AM   Critical care time was exclusive of:  Separately billable procedures and treating other patients   Critical care was necessary to treat or prevent imminent or life-threatening deterioration of the following conditions:  Respiratory failure   Critical care was time spent personally by me on the following activities:  Development of treatment plan with patient or surrogate, evaluation of patient's response to treatment, examination of patient, obtaining history from patient or surrogate, ordering and performing treatments and  interventions, ordering and review of laboratory studies, ordering and review of radiographic studies, pulse oximetry, re-evaluation of patient's condition and review of old charts   I assumed direction of critical care for this patient from another provider in my specialty: no      Critical Care performed: Yes, see critical care note(s)  ____________________________________________   INITIAL IMPRESSION / ASSESSMENT AND PLAN / ED COURSE  As part of my medical decision making, I reviewed the following data within the electronic medical  record:  Notes from prior ED visits and [place] Controlled Substance Database  This is a 77 year old female who comes into the hospital today with some shortness of breath and a history of COPD.  The patient was in severe distress when she initially arrived.  She was recently admitted for COPD exacerbation and this is very similar.  We did give the patient some DuoNeb's when she initially arrived.  She received 3 DuoNeb's and we did call for BiPAP.  The patient was grunting and sitting up on the bed.  She was in some significant distress.  Once the patient received a DuoNeb's in the BiPAP her breathing did improve.  She did receive a dose of Ativan for anxiety as well as Solu-Medrol and magnesium sulfate.  The patient still has some wheezing but is much improved.  I will give her another dose of albuterol and her care will be signed out to Dr. Corky Downs who will follow up the results and disposition the patient. I will give the patient a dose of azithromycin for further treatment     I did contact Dr. Bridgett Larsson who is the hospitalist on-call at this time.  I will have him admit the patient since she is still on BiPAP and having wheezing. ____________________________________________   FINAL CLINICAL IMPRESSION(S) / ED DIAGNOSES  Final diagnoses:  COPD exacerbation [facility])     ED Discharge Orders    None       Note:  This document was prepared using Dragon voice  recognition software and may include unintentional dictation errors.    Loney Hering, MD 06/12/17 [id number]    Loney Hering, MD 06/12/17 [phone]

## 2017-06-12 NOTE — ED Notes (Signed)
PCXR completed; RT at bedside to setup bipap as ordered

## 2017-06-13 ENCOUNTER — Inpatient Hospital Stay: Payer: Medicare HMO

## 2017-06-13 LAB — CBC WITH DIFFERENTIAL/PLATELET
Basophils Absolute: 0 10*3/uL (ref 0–0.1)
Basophils Relative: 0 %
EOS ABS: 0 10*3/uL (ref 0–0.7)
Eosinophils Relative: 0 %
HEMATOCRIT: 34 % — AB (ref 35.0–47.0)
HEMOGLOBIN: 11.5 g/dL — AB (ref 12.0–16.0)
LYMPHS ABS: 0.6 10*3/uL — AB (ref 1.0–3.6)
Lymphocytes Relative: 11 %
MCH: 32.1 pg (ref 26.0–34.0)
MCHC: 33.7 g/dL (ref 32.0–36.0)
MCV: 95.3 fL (ref 80.0–100.0)
MONOS PCT: 5 %
Monocytes Absolute: 0.2 10*3/uL (ref 0.2–0.9)
NEUTROS ABS: 4.2 10*3/uL (ref 1.4–6.5)
NEUTROS PCT: 84 %
Platelets: 173 10*3/uL (ref 150–440)
RBC: 3.57 MIL/uL — ABNORMAL LOW (ref 3.80–5.20)
RDW: 15.7 % — AB (ref 11.5–14.5)
WBC: 5 10*3/uL (ref 3.6–11.0)

## 2017-06-13 LAB — BASIC METABOLIC PANEL
Anion gap: 7 (ref 5–15)
BUN: 30 mg/dL — ABNORMAL HIGH (ref 6–20)
CO2: 25 mmol/L (ref 22–32)
CREATININE: 1.88 mg/dL — AB (ref 0.44–1.00)
Calcium: 8.7 mg/dL — ABNORMAL LOW (ref 8.9–10.3)
Chloride: 105 mmol/L (ref 101–111)
GFR calc non Af Amer: 25 mL/min — ABNORMAL LOW (ref >60)
GFR, EST AFRICAN AMERICAN: 29 mL/min — AB (ref >60)
Glucose, Bld: 150 mg/dL — ABNORMAL HIGH (ref 65–99)
Potassium: 5.2 mmol/L — ABNORMAL HIGH (ref 3.5–5.1)
Sodium: 137 mmol/L (ref 135–145)

## 2017-06-13 LAB — EXPECTORATED SPUTUM ASSESSMENT W REFEX TO RESP CULTURE

## 2017-06-13 LAB — PROCALCITONIN: Procalcitonin: <0.1 ng/mL

## 2017-06-13 LAB — EXPECTORATED SPUTUM ASSESSMENT W GRAM STAIN, RFLX TO RESP C

## 2017-06-13 LAB — MAGNESIUM: Magnesium: 2 mg/dL (ref 1.7–2.4)

## 2017-06-13 LAB — PHOSPHORUS: Phosphorus: 3 mg/dL (ref 2.5–4.6)

## 2017-06-13 LAB — GLUCOSE, CAPILLARY: Glucose-Capillary: 133 mg/dL — ABNORMAL HIGH (ref 65–99)

## 2017-06-13 NOTE — Progress Notes (Signed)
Report given to 1C. Patient transferred via wheelchair. No cardiac monitoring. CCMD notified. Katherine [name]

## 2017-06-13 NOTE — Care Management (Signed)
Patient will not qualify for NIV. Copays would have been around $200/month to patient.

## 2017-06-13 NOTE — Progress Notes (Addendum)
[facility] [place] NAME: Emmalena Canny    MR#:  [id number]  DATE OF BIRTH:  [birth year]  SUBJECTIVE:  CHIEF COMPLAINT:   Chief Complaint  Patient presents with  . Cough  some cough, off BiPAP, less SOB REVIEW OF SYSTEMS:  Review of Systems  Constitutional: Negative for chills, fever and weight loss.  HENT: Negative for nosebleeds and sore throat.   Eyes: Negative for blurred vision.  Respiratory: Positive for cough and shortness of breath. Negative for wheezing.   Cardiovascular: Negative for chest pain, orthopnea, leg swelling and PND.  Gastrointestinal: Negative for abdominal pain, constipation, diarrhea, heartburn, nausea and vomiting.  Genitourinary: Negative for dysuria and urgency.  Musculoskeletal: Negative for back pain.  Skin: Negative for rash.  Neurological: Negative for dizziness, speech change, focal weakness and headaches.  Endo/Heme/Allergies: Does not bruise/bleed easily.  Psychiatric/Behavioral: Negative for depression.   DRUG ALLERGIES:   Allergies  Allergen Reactions  . Bee Venom Swelling  . Enalapril Maleate Swelling  . Other     Bee stings   VITALS:  Blood pressure (!) 146/96, pulse 76, temperature 98.1 F (36.7 C), temperature source Oral, resp. rate 17, height 5\' 1"  (1.549 m), weight 113.3 kg (249 lb 12.5 oz), SpO2 98 %. PHYSICAL EXAMINATION:  Physical Exam  Constitutional: She is oriented to person, place, and time.  HENT:  Head: Normocephalic and atraumatic.  Eyes: Pupils are equal, round, and reactive to light. Conjunctivae and EOM are normal.  Neck: Normal range of motion. Neck supple. No tracheal deviation present. No thyromegaly present.  Cardiovascular: Normal rate, regular rhythm and normal heart sounds.  Pulmonary/Chest: Effort normal and breath sounds normal. No respiratory distress. She has no wheezes. She exhibits no tenderness.  Abdominal: Soft. Bowel sounds are normal. She exhibits no  distension. There is no tenderness.  Musculoskeletal: Normal range of motion.  Neurological: She is alert and oriented to person, place, and time. No cranial nerve deficit.  Skin: Skin is warm and dry. No rash noted.   LABORATORY PANEL:  Female CBC Recent Labs  Lab 06/13/17 0441  WBC 5.0  HGB 11.5*  HCT 34.0*  PLT 173   ------------------------------------------------------------------------------------------------------------------ Chemistries  Recent Labs  Lab 06/13/17 0441  NA 137  K 5.2*  CL 105  CO2 25  GLUCOSE 150*  BUN 30*  CREATININE 1.88*  CALCIUM 8.7*  MG 2.0   RADIOLOGY:  Dg Chest Port 1 View  Result Date: 06/13/2017 CLINICAL DATA:  Difficulty breathing EXAM: PORTABLE CHEST 1 VIEW COMPARISON:  June 12, 2017 FINDINGS: There is mild atelectatic change in the right lower lobe. There is no edema or consolidation. Heart is upper normal size with pulmonary vascularity within normal limits. There is aortic atherosclerosis. No adenopathy. No bone lesions. IMPRESSION: Mild right lower lobe atelectatic change. No edema or consolidation. Heart upper normal in size. There is aortic atherosclerosis. Aortic Atherosclerosis (ICD10-I70.0). Electronically Signed   By: Lowella Grip III M.D.   On: 06/13/2017 07:46   ASSESSMENT AND PLAN:   *Acute on chronic respiratory failure with hypoxia due to COPD exacerbation. - Off BiPAP now - continue IV Solu-Medrol, DuoNeb, continue Spiriva.  Robitussin as needed.  - checking procalcitonin - if neg, can stop zosyn  * Hyperkalemia - recheck in am.   * Hypertension.  Continue cardizem & hydralazine  *  CKD stage III - stable       All the records are reviewed and case discussed with Care  Management/Social Worker. Management plans discussed with the patient, nursing and they are in agreement.  CODE STATUS: DNR  TOTAL TIME TAKING CARE OF THIS PATIENT: 15 minutes.   More than 50% of the time was spent in  counseling/coordination of care: YES  POSSIBLE D/C IN 1 DAYS, DEPENDING ON CLINICAL CONDITION.   Max Sane M.D on 06/13/2017 at 6:15 PM  Between 7am to 6pm - Pager - [phone]  After 6pm go to [email or web address] - Proofreader  [facility]  Office  [phone]  CC: Primary care physician; [name], NP  Note: This dictation was prepared with Dragon dictation along with smaller phrase technology. Any transcriptional errors that result from this process are unintentional.

## 2017-06-13 NOTE — Progress Notes (Signed)
Name: Katherine [name] MRN: [id number] DOB: 11/08/1940     CONSULTATION DATE: 06/12/2017  Subjective & Objective: Off BiPAP and no major issues last night  PAST MEDICAL HISTORY :   has a past medical history of Angioedema, Anxiety, Anxiety and depression, Chronic constipation, Chronic kidney disease, COPD (chronic obstructive pulmonary disease) ([place]), Gallstones, GERD (gastroesophageal reflux disease), Hypertension, Left ventricular hypertrophy, Osteoarthritis, Prurigo nodularis, Tobacco abuse, and Vitamin D deficiency disease.  has a past surgical history that includes Cholecystectomy; Polypectomy (11/2011); and Total knee arthroplasty (Right, 02/08/2016). Prior to Admission medications   Medication Sig Start Date End Date Taking? Authorizing Provider  acetaminophen (TYLENOL) 650 MG CR tablet Take 650-1,300 mg by mouth every 8 (eight) hours as needed for pain.    Yes [provider]  albuterol (PROVENTIL HFA) 108 (90 Base) MCG/ACT inhaler Inhale 2 puffs into the lungs every 4 (four) hours as needed for wheezing or shortness of breath. 05/30/17  Yes Wieting, Richard, MD  aspirin EC 81 MG tablet Take 81 mg by mouth daily.   Yes [provider]  cetirizine (ZYRTEC) 10 MG tablet TAKE 1/2 TABLET(5 MG) BY MOUTH AT BEDTIME 05/25/17  Yes [name], NP  cholecalciferol (VITAMIN D) 1000 units tablet Take 1,000 Units by mouth daily.   Yes [provider]  diltiazem (CARDIZEM CD) 120 MG 24 hr capsule Take 1 capsule (120 mg total) by mouth daily. 05/30/17 05/30/18 Yes Wieting, Richard, MD  FLUoxetine (PROZAC) 10 MG capsule TAKE 1 CAPSULE(10 MG) BY MOUTH DAILY 01/30/17  Yes [name], NP  hydrALAZINE (APRESOLINE) 25 MG tablet Take 1 tablet (25 mg total) by mouth 3 (three) times daily. Patient taking differently: Take 25 mg by mouth 2 (two) times daily.  05/30/17  Yes Wieting, Richard, MD  ipratropium-albuterol (DUONEB) 0.5-2.5 (3) MG/3ML SOLN Take 3 mLs by nebulization every  6 (six) hours as needed. Patient taking differently: Take 3 mLs by nebulization every 6 (six) hours as needed (shortness of breath and/or wheezing).  11/29/15  Yes [name], MD  omeprazole (PRILOSEC OTC) 20 MG tablet Take 20 mg by mouth daily as needed (Heartburn).    Yes [provider]  SPIRIVA HANDIHALER 18 MCG inhalation capsule PLACE 1 CAPSULE INTO INHALER AND INHALE EVERY DAY 04/25/17  Yes Volney American, PA-C  fluticasone furoate-vilanterol (BREO ELLIPTA) 100-25 MCG/INH AEPB Inhale 1 puff into the lungs daily. Patient not taking: Reported on 06/05/2017 04/27/16   [name], MD  guaiFENesin (ROBITUSSIN) 100 MG/5ML SOLN Take 5 mLs (100 mg total) by mouth every 4 (four) hours as needed for cough or to loosen phlegm. Patient not taking: Reported on 06/12/2017 05/21/17   Demetrios Loll, MD  predniSONE (DELTASONE) 10 MG tablet Take 3 tabs po day1; 2 tabs po day2; 1 tab po day 3 then stop Patient not taking: Reported on 06/12/2017 05/24/17   Loletha Grayer, MD   Allergies  Allergen Reactions  . Bee Venom Swelling  . Enalapril Maleate Swelling  . Other     Bee stings    FAMILY HISTORY:  family history includes Alcohol abuse in her mother; Hypertension in her son; Sickle cell anemia in her daughter. SOCIAL HISTORY:  reports that she has quit smoking. Her smoking use included cigarettes. She has a 30.00 pack-year smoking history. She has never used smokeless tobacco. She reports that she does not drink alcohol or use drugs.  REVIEW OF SYSTEMS:   Unable to obtain due to critical illness   VITAL SIGNS: Temp:  [  98 F (36.7 C)-98.2 F (36.8 C)] 98 F (36.7 C) (05/01 1154) Pulse Rate:  [68-98] 68 (05/01 1100) Resp:  [16-25] 20 (05/01 1100) BP: (118-153)/(60-87) 122/86 (05/01 1100) SpO2:  [95 %-100 %] 96 % (05/01 1100) FiO2 (%):  [28 %] 28 % (04/30 2016)  Physical Examination:  A + O and no acute neuro deficits On [place], no distress, able to talk in full sentences. BEAE  and no rales. S1 & S2 audible and no murmur Benign abdomen with normal peristalses No leg edema  ASSESSMENT / PLAN: Acute on chronic respiratory failure with distress (improved), base line home O2 [place] 2 /min, off BiPAP and  Tolerating [place].  OSA suggested by the patient body habitus, on nocturnal CPAP -Nocturnal CPAP  COPD exacerbation -Zosyn + Bronchodilators + tapering sterids + inhaled steroids   -Atelectasis and pneumonia. Improved bibasilar airspace disease -Zosyn.  MRSA PCR -ve. Consider de-escalation of ABX if no SIRS in the next 24 h.  Anxiety and depression -Supportive care  CKD with mild hyperkalemia -Avoid nephrotoxins, optimize hydration,  monitor renal panel and urine out put.  HTN -Optimize antihypertensives and monitor hemodynamics  Osteoarthritis, Vit D deficiency  s/p Rt. Knee arthroplasty -Vit D, analgesia and supportive care  Mild anemia -Keep Hb > 7 gm/dl.  Full code  DVT & GI prophylaxis. Continue with supportive care

## 2017-06-14 LAB — BASIC METABOLIC PANEL
ANION GAP: 6 (ref 5–15)
BUN: 36 mg/dL — ABNORMAL HIGH (ref 6–20)
CO2: 28 mmol/L (ref 22–32)
Calcium: 8.9 mg/dL (ref 8.9–10.3)
Chloride: 105 mmol/L (ref 101–111)
Creatinine, Ser: 1.78 mg/dL — ABNORMAL HIGH (ref 0.44–1.00)
GFR calc Af Amer: 31 mL/min — ABNORMAL LOW (ref >60)
GFR, EST NON AFRICAN AMERICAN: 27 mL/min — AB (ref >60)
GLUCOSE: 121 mg/dL — AB (ref 65–99)
POTASSIUM: 5.1 mmol/L (ref 3.5–5.1)
Sodium: 139 mmol/L (ref 135–145)

## 2017-06-14 LAB — CBC
HEMATOCRIT: 35.1 % (ref 35.0–47.0)
Hemoglobin: 11.6 g/dL — ABNORMAL LOW (ref 12.0–16.0)
MCH: 31.8 pg (ref 26.0–34.0)
MCHC: 33.1 g/dL (ref 32.0–36.0)
MCV: 96.1 fL (ref 80.0–100.0)
Platelets: 207 10*3/uL (ref 150–440)
RBC: 3.65 MIL/uL — AB (ref 3.80–5.20)
RDW: 15.9 % — ABNORMAL HIGH (ref 11.5–14.5)
WBC: 7.2 10*3/uL (ref 3.6–11.0)

## 2017-06-14 LAB — CALCIUM, IONIZED: CALCIUM, IONIZED, SERUM: 5.1 mg/dL (ref 4.5–5.6)

## 2017-06-14 MED ORDER — DM-GUAIFENESIN ER 30-600 MG PO TB12
1.0000 | ORAL_TABLET | Freq: Two times a day (BID) | ORAL | Status: DC
  Filled 2017-06-14 (×2): qty 1

## 2017-06-14 MED ORDER — DEXTROMETHORPHAN POLISTIREX ER 30 MG/5ML PO SUER
30.0000 mg | Freq: Two times a day (BID) | ORAL | Status: DC
  Administered 2017-06-14 – 2017-06-16 (×5): 30 mg via ORAL
  Filled 2017-06-14 (×6): qty 5

## 2017-06-14 MED ORDER — AMOXICILLIN-POT CLAVULANATE 875-125 MG PO TABS
1.0000 | ORAL_TABLET | Freq: Two times a day (BID) | ORAL | Status: DC
  Administered 2017-06-14 – 2017-06-16 (×5): 1 via ORAL
  Filled 2017-06-14 (×5): qty 1

## 2017-06-14 MED ORDER — GUAIFENESIN ER 600 MG PO TB12
600.0000 mg | ORAL_TABLET | Freq: Two times a day (BID) | ORAL | Status: DC
  Administered 2017-06-14 – 2017-06-16 (×5): 600 mg via ORAL
  Filled 2017-06-14 (×5): qty 1

## 2017-06-14 MED ORDER — IPRATROPIUM-ALBUTEROL 0.5-2.5 (3) MG/3ML IN SOLN
3.0000 mL | Freq: Three times a day (TID) | RESPIRATORY_TRACT | Status: DC
  Administered 2017-06-14 – 2017-06-16 (×4): 3 mL via RESPIRATORY_TRACT
  Filled 2017-06-14 (×5): qty 3

## 2017-06-14 NOTE — Progress Notes (Signed)
[place] at [place] NAME: Katherine [name]    MR#:  [id number]  DATE OF BIRTH:  07-Jun-1940  SUBJECTIVE:  CHIEF COMPLAINT:   Chief Complaint  Patient presents with  . Cough  slowly improving.  REVIEW OF SYSTEMS:  Review of Systems  Constitutional: Negative for chills, fever and weight loss.  HENT: Negative for nosebleeds and sore throat.   Eyes: Negative for blurred vision.  Respiratory: Positive for cough and shortness of breath. Negative for wheezing.   Cardiovascular: Negative for chest pain, orthopnea, leg swelling and PND.  Gastrointestinal: Negative for abdominal pain, constipation, diarrhea, heartburn, nausea and vomiting.  Genitourinary: Negative for dysuria and urgency.  Musculoskeletal: Negative for back pain.  Skin: Negative for rash.  Neurological: Negative for dizziness, speech change, focal weakness and headaches.  Endo/Heme/Allergies: Does not bruise/bleed easily.  Psychiatric/Behavioral: Negative for depression.   DRUG ALLERGIES:   Allergies  Allergen Reactions  . Bee Venom Swelling  . Enalapril Maleate Swelling  . Other     Bee stings   VITALS:  Blood pressure 123/77, pulse 79, temperature 98.3 F (36.8 C), temperature source Oral, resp. rate 20, height 5\' 1"  (1.549 m), weight 113.3 kg (249 lb 12.5 oz), SpO2 99 %. PHYSICAL EXAMINATION:  Physical Exam  Constitutional: She is oriented to person, place, and time.  HENT:  Head: Normocephalic and atraumatic.  Eyes: Pupils are equal, round, and reactive to light. Conjunctivae and EOM are normal.  Neck: Normal range of motion. Neck supple. No tracheal deviation present. No thyromegaly present.  Cardiovascular: Normal rate, regular rhythm and normal heart sounds.  Pulmonary/Chest: Effort normal and breath sounds normal. No respiratory distress. She has no wheezes. She exhibits no tenderness.  Abdominal: Soft. Bowel sounds are normal. She exhibits no distension. There  is no tenderness.  Musculoskeletal: Normal range of motion.  Neurological: She is alert and oriented to person, place, and time. No cranial nerve deficit.  Skin: Skin is warm and dry. No rash noted.   LABORATORY PANEL:  Female CBC Recent Labs  Lab 06/14/17 0442  WBC 7.2  HGB 11.6*  HCT 35.1  PLT 207   ------------------------------------------------------------------------------------------------------------------ Chemistries  Recent Labs  Lab 06/13/17 0441 06/14/17 0442  NA 137 139  K 5.2* 5.1  CL 105 105  CO2 25 28  GLUCOSE 150* 121*  BUN 30* 36*  CREATININE 1.88* 1.78*  CALCIUM 8.7* 8.9  MG 2.0  --    RADIOLOGY:  No results found. ASSESSMENT AND PLAN:   *Acute on chronic respiratory failure with hypoxia due to COPD exacerbation. - Off BiPAP now - continue IV Solu-Medrol, DuoNeb, continue Spiriva.  Robitussin as needed.  - procalcitonin normal - if neg, change zosyn to augmentin  * Hyperkalemia - resolved   * Hypertension.  Continue cardizem & hydralazine  *  CKD stage III - stable       All the records are reviewed and case discussed with Care Management/Social Worker. Management plans discussed with the patient, nursing and they are in agreement.  CODE STATUS: DNR  TOTAL TIME TAKING CARE OF THIS PATIENT: 15 minutes.   More than 50% of the time was spent in counseling/coordination of care: YES  POSSIBLE D/C IN 1 DAYS, DEPENDING ON CLINICAL CONDITION.   Max Sane M.D on 06/14/2017 at 9:06 AM  Between 7am to 6pm - Pager - [phone]  After 6pm go to [email or web address] - [facility]  Office  [phone]  CC: Primary care physician; [name], NP  Note: This dictation was prepared with Dragon dictation along with smaller phrase technology. Any transcriptional errors that result from this process are unintentional.

## 2017-06-15 LAB — CULTURE, RESPIRATORY W GRAM STAIN

## 2017-06-15 LAB — CULTURE, RESPIRATORY

## 2017-06-15 NOTE — Care Management Important Message (Signed)
Important Message  Patient Details  Name: Katherine [name] MRN: [id number] Date of Birth: December 12, 1940   Medicare Important Message Given:  Yes    Juliann Pulse A Shanen Norris 06/15/2017, 11:30 AM

## 2017-06-15 NOTE — Progress Notes (Signed)
Patient ambulated around unit once had to stop 3 times once resting in chair.  O2 sat maintained between 92-94 % only dropping to 89% briefly before sitting in chair and then rebounding.  Per patient she does not fell well enough to go home today, Dr Margaretmary Eddy had me discontinue discharge order.

## 2017-06-15 NOTE — Progress Notes (Signed)
[place] at [place] NAME: Katherine [name]    MR#:  [id number]  DATE OF BIRTH:  02-19-1940  SUBJECTIVE:  CHIEF COMPLAINT:   Chief Complaint  Patient presents with  . Cough  Patient is resting comfortably but with ambulation she is becoming dyspneic REVIEW OF SYSTEMS:  Review of Systems  Constitutional: Negative for chills, fever and weight loss.  HENT: Negative for nosebleeds and sore throat.   Eyes: Negative for blurred vision.  Respiratory: Positive for cough and shortness of breath. Negative for wheezing.   Cardiovascular: Negative for chest pain, orthopnea, leg swelling and PND.  Gastrointestinal: Negative for abdominal pain, constipation, diarrhea, heartburn, nausea and vomiting.  Genitourinary: Negative for dysuria and urgency.  Musculoskeletal: Negative for back pain.  Skin: Negative for rash.  Neurological: Negative for dizziness, speech change, focal weakness and headaches.  Endo/Heme/Allergies: Does not bruise/bleed easily.  Psychiatric/Behavioral: Negative for depression.   DRUG ALLERGIES:   Allergies  Allergen Reactions  . Bee Venom Swelling  . Enalapril Maleate Swelling  . Other     Bee stings   VITALS:  Blood pressure 123/67, pulse 89, temperature 98.1 F (36.7 C), temperature source Oral, resp. rate 20, height 5\' 1"  (1.549 m), weight 113.3 kg (249 lb 12.5 oz), SpO2 93 %. PHYSICAL EXAMINATION:  Physical Exam  Constitutional: She is oriented to person, place, and time.  HENT:  Head: Normocephalic and atraumatic.  Eyes: Pupils are equal, round, and reactive to light. Conjunctivae and EOM are normal.  Neck: Normal range of motion. Neck supple. No tracheal deviation present. No thyromegaly present.  Cardiovascular: Normal rate, regular rhythm and normal heart sounds.  Pulmonary/Chest: Effort normal and breath sounds normal. No respiratory distress. She has no wheezes. She exhibits no tenderness.  Abdominal: Soft.  Bowel sounds are normal. She exhibits no distension. There is no tenderness.  Musculoskeletal: Normal range of motion.  Neurological: She is alert and oriented to person, place, and time. No cranial nerve deficit.  Skin: Skin is warm and dry. No rash noted.   LABORATORY PANEL:  Female CBC Recent Labs  Lab 06/14/17 0442  WBC 7.2  HGB 11.6*  HCT 35.1  PLT 207   ------------------------------------------------------------------------------------------------------------------ Chemistries  Recent Labs  Lab 06/13/17 0441 06/14/17 0442  NA 137 139  K 5.2* 5.1  CL 105 105  CO2 25 28  GLUCOSE 150* 121*  BUN 30* 36*  CREATININE 1.88* 1.78*  CALCIUM 8.7* 8.9  MG 2.0  --    RADIOLOGY:  No results found. ASSESSMENT AND PLAN:   *Acute on chronic respiratory failure with hypoxia due to COPD exacerbation. - Off BiPAP now -Taper IV Solu-Medrol, DuoNeb, continue Spiriva.  Robitussin as needed.  - procalcitonin normal - if neg, change zosyn to augmentin -Incentive spirometry  * Hyperkalemia - resolved   * Hypertension.  Continue cardizem & hydralazine  *AKI on CKD stage III -Clinically improving creatinine 1.49-1.88-1.78 -Avoid nephrotoxins and check BMP in a.m. - stable    Discharge home with home health PT RN and social worker   All the records are reviewed and case discussed with Care Management/Social Worker. Management plans discussed with the patient, nursing and they are in agreement.  CODE STATUS: DNR  TOTAL TIME TAKING CARE OF THIS PATIENT: 25 minutes.   More than 50% of the time was spent in counseling/coordination of care: YES  POSSIBLE D/C IN 1 DAYS, DEPENDING ON CLINICAL CONDITION.   Nicholes Mango M.D on 06/15/2017 at  2:27 PM  Between 7am to 6pm - Pager - [phone]  After 6pm go to [email or web address] - Proofreader  [facility]  Office  [phone]  CC: Primary care physician; [name], NP  Note: This dictation  was prepared with Dragon dictation along with smaller phrase technology. Any transcriptional errors that result from this process are unintentional.

## 2017-06-15 NOTE — Care Management (Addendum)
Corene Cornea with [facility] notified of patient discharge. Discharge cancelled per MD. Corene Cornea updated.

## 2017-06-16 LAB — BASIC METABOLIC PANEL
Anion gap: 7 (ref 5–15)
BUN: 42 mg/dL — AB (ref 6–20)
CHLORIDE: 104 mmol/L (ref 101–111)
CO2: 29 mmol/L (ref 22–32)
CREATININE: 1.87 mg/dL — AB (ref 0.44–1.00)
Calcium: 8.7 mg/dL — ABNORMAL LOW (ref 8.9–10.3)
GFR calc Af Amer: 29 mL/min — ABNORMAL LOW (ref >60)
GFR calc non Af Amer: 25 mL/min — ABNORMAL LOW (ref >60)
GLUCOSE: 123 mg/dL — AB (ref 65–99)
Potassium: 4.9 mmol/L (ref 3.5–5.1)
Sodium: 140 mmol/L (ref 135–145)

## 2017-06-16 MED ORDER — PREDNISONE 10 MG (21) PO TBPK: 10.0000 mg | ORAL_TABLET | Freq: Every day | ORAL | 0 refills | Status: DC

## 2017-06-16 MED ORDER — AMOXICILLIN-POT CLAVULANATE 875-125 MG PO TABS
1.0000 | ORAL_TABLET | Freq: Two times a day (BID) | ORAL | 0 refills | Status: DC
Start: 2017-06-16 — End: 2017-06-28

## 2017-06-16 MED ORDER — SENNOSIDES-DOCUSATE SODIUM 8.6-50 MG PO TABS: 1.0000 | ORAL_TABLET | Freq: Every evening | ORAL | Status: DC | PRN

## 2017-06-16 NOTE — Discharge Instructions (Signed)
Follow-up with primary care physician in 1 week, monitor renal function Follow-up with pulmonology in 1 to 2 weeks

## 2017-06-16 NOTE — Progress Notes (Signed)
Pt being discharged home, discharge instructions and prescriptions reviewed with pt and husband, states understanding, pt with no complaints at discharge

## 2017-06-16 NOTE — Discharge Summary (Signed)
[place] at [place] NAME: Katherine [name]    MR#:  [id number]  DATE OF BIRTH:  1940/06/17  DATE OF ADMISSION:  06/12/2017 ADMITTING PHYSICIAN: Katherine Loll, MD  DATE OF DISCHARGE:  06/16/17  PRIMARY CARE PHYSICIAN: Katherine Haddock, NP    ADMISSION DIAGNOSIS:  COPD exacerbation ([place]) [J44.1]  DISCHARGE DIAGNOSIS:  Active Problems:   Acute on chronic respiratory failure with hypoxia ([place])   SECONDARY DIAGNOSIS:   Past Medical History:  Diagnosis Date  . Angioedema   . Anxiety   . Anxiety and depression   . Chronic constipation   . Chronic kidney disease    stage 3  . COPD (chronic obstructive pulmonary disease) ([place])   . Gallstones   . GERD (gastroesophageal reflux disease)   . Hypertension   . Left ventricular hypertrophy   . Osteoarthritis   . Prurigo nodularis   . Tobacco abuse   . Vitamin D deficiency disease     HOSPITAL COURSE:   Katherine [name]  is a 77 y.o. female with a known history of multiple medical problems as below.  The patient was recently admitted for COPD exacerbation and discharged home.  She finished her prednisone.  She has had worsening shortness of breath, cough and wheezing all night long since yesterday.  She is in respiratory distress and is found hypoxia in the ED. she is treated with nebulizer multiple times without improvement and put on BiPAP.  He denies other symptoms.    *Acute on chronic respiratory failure with hypoxia due to COPD exacerbation. - Off BiPAP now -Taperd IV Solu-Medrol to po prednisno, DuoNeb, continue Spiriva. Robitussin as needed.  - procalcitonin normal -  change zosyn to augmentin -Incentive spirometry -Outpatient follow-up with pulmonology  * Hyperkalemia - resolved  * Hypertension. Continue  hydralazine.  Holding Cardizem CD in view of soft blood pressure  *AKI on CKD stage III -Clinically improving creatinine 1.49-1.88-1.78 -Avoid nephrotoxins       DISCHARGE CONDITIONS:   stable  CONSULTS OBTAINED:     PROCEDURES  Bipap  DRUG ALLERGIES:   Allergies  Allergen Reactions  . Bee Venom Swelling  . Enalapril Maleate Swelling  . Other     Bee stings    DISCHARGE MEDICATIONS:   Allergies as of 06/16/2017      Reactions   Bee Venom Swelling   Enalapril Maleate Swelling   Other    Bee stings      Medication List    STOP taking these medications   diltiazem 120 MG 24 hr capsule Commonly known as:  CARDIZEM CD   fluticasone furoate-vilanterol 100-25 MCG/INH Aepb Commonly known as:  BREO ELLIPTA   predniSONE 10 MG tablet Commonly known as:  DELTASONE Replaced by:  predniSONE 10 MG (21) Tbpk tablet     TAKE these medications   acetaminophen 650 MG CR tablet Commonly known as:  TYLENOL Take 650-1,300 mg by mouth every 8 (eight) hours as needed for pain.   albuterol 108 (90 Base) MCG/ACT inhaler Commonly known as:  PROVENTIL HFA Inhale 2 puffs into the lungs every 4 (four) hours as needed for wheezing or shortness of breath.   amoxicillin-clavulanate 875-125 MG tablet Commonly known as:  AUGMENTIN Take 1 tablet by mouth every 12 (twelve) hours.   aspirin EC 81 MG tablet Take 81 mg by mouth daily.   cetirizine 10 MG tablet Commonly known as:  ZYRTEC TAKE 1/2 TABLET(5 MG) BY MOUTH AT BEDTIME  cholecalciferol 1000 units tablet Commonly known as:  VITAMIN D Take 1,000 Units by mouth daily.   FLUoxetine 10 MG capsule Commonly known as:  PROZAC TAKE 1 CAPSULE(10 MG) BY MOUTH DAILY   guaiFENesin 100 MG/5ML Soln Commonly known as:  ROBITUSSIN Take 5 mLs (100 mg total) by mouth every 4 (four) hours as needed for cough or to loosen phlegm.   hydrALAZINE 25 MG tablet Commonly known as:  APRESOLINE Take 1 tablet (25 mg total) by mouth 3 (three) times daily. What changed:  when to take this   ipratropium-albuterol 0.5-2.5 (3) MG/3ML Soln Commonly known as:  DUONEB Take 3 mLs by nebulization every  6 (six) hours as needed. What changed:  reasons to take this   omeprazole 20 MG tablet Commonly known as:  PRILOSEC OTC Take 20 mg by mouth daily as needed (Heartburn).   predniSONE 10 MG (21) Tbpk tablet Commonly known as:  STERAPRED UNI-PAK 21 TAB Take 1 tablet (10 mg total) by mouth daily. Take 6 tablets by mouth for 1 day followed by  5 tablets by mouth for 1 day followed by  4 tablets by mouth for 1 day followed by  3 tablets by mouth for 1 day followed by  2 tablets by mouth for 1 day followed by  1 tablet by mouth for a day and stop Replaces:  predniSONE 10 MG tablet   senna-docusate 8.6-50 MG tablet Commonly known as:  Senokot-S Take 1 tablet by mouth at bedtime as needed for mild constipation.   SPIRIVA HANDIHALER 18 MCG inhalation capsule Generic drug:  tiotropium PLACE 1 CAPSULE INTO INHALER AND INHALE EVERY DAY        DISCHARGE INSTRUCTIONS:   Follow-up with primary care physician in 1 week, monitor renal function Follow-up with pulmonology in 1 to 2 weeks   DIET:  Cardiac diet  DISCHARGE CONDITION:  Stable  ACTIVITY:  Activity as tolerated  OXYGEN:  Home Oxygen: Yes.     Oxygen Delivery: 2 liters/min via Patient connected to nasal cannula oxygen  DISCHARGE LOCATION:  home   If you experience worsening of your admission symptoms, develop shortness of breath, life threatening emergency, suicidal or homicidal thoughts you must seek medical attention immediately by calling 911 or calling your MD immediately  if symptoms less severe.  You Must read complete instructions/literature along with all the possible adverse reactions/side effects for all the Medicines you take and that have been prescribed to you. Take any new Medicines after you have completely understood and accpet all the possible adverse reactions/side effects.   Please note  You were cared for by a hospitalist during your hospital stay. If you have any questions about your discharge  medications or the care you received while you were in the hospital after you are discharged, you can call the unit and asked to speak with the hospitalist on call if the hospitalist that took care of you is not available. Once you are discharged, your primary care physician will handle any further medical issues. Please note that NO REFILLS for any discharge medications will be authorized once you are discharged, as it is imperative that you return to your primary care physician (or establish a relationship with a primary care physician if you do not have one) for your aftercare needs so that they can reassess your need for medications and monitor your lab values.     Today  Chief Complaint  Patient presents with  . Cough   Patient is feeling  fine ambulated without any difficulty, chronically lives on 2 L of oxygen via nasal cannula.  Wants to go home today  ROS:  CONSTITUTIONAL: Denies fevers, chills. Denies any fatigue, weakness.  EYES: Denies blurry vision, double vision, eye pain. EARS, NOSE, THROAT: Denies tinnitus, ear pain, hearing loss. RESPIRATORY: Denies cough, wheeze, shortness of breath.  CARDIOVASCULAR: Denies chest pain, palpitations, edema.  GASTROINTESTINAL: Denies nausea, vomiting, diarrhea, abdominal pain. Denies bright red blood per rectum. GENITOURINARY: Denies dysuria, hematuria. ENDOCRINE: Denies nocturia or thyroid problems. HEMATOLOGIC AND LYMPHATIC: Denies easy bruising or bleeding. SKIN: Denies rash or lesion. MUSCULOSKELETAL: Denies pain in neck, back, shoulder, knees, hips or arthritic symptoms.  NEUROLOGIC: Denies paralysis, paresthesias.  PSYCHIATRIC: Denies anxiety or depressive symptoms.   VITAL SIGNS:  Blood pressure 126/66, pulse 79, temperature 98.3 F (36.8 C), temperature source Oral, resp. rate 18, height 5\' 1"  (1.549 m), weight 113.3 kg (249 lb 12.5 oz), SpO2 91 %.  I/O:    Intake/Output Summary (Last 24 hours) at 06/16/2017 1329 Last data  filed at 06/16/2017 1024 Gross per 24 hour  Intake 480 ml  Output 400 ml  Net 80 ml    PHYSICAL EXAMINATION:  GENERAL:  77 y.o.-year-old patient lying in the bed with no acute distress.  EYES: Pupils equal, round, reactive to light and accommodation. No scleral icterus. Extraocular muscles intact.  HEENT: Head atraumatic, normocephalic. Oropharynx and nasopharynx clear.  NECK:  Supple, no jugular venous distention. No thyroid enlargement, no tenderness.  LUNGS: Normal breath sounds bilaterally, no wheezing, rales,rhonchi or crepitation. No use of accessory muscles of respiration.  CARDIOVASCULAR: S1, S2 normal. No murmurs, rubs, or gallops.  ABDOMEN: Soft, non-tender, non-distended. Bowel sounds present. No organomegaly or mass.  EXTREMITIES: No pedal edema, cyanosis, or clubbing.  NEUROLOGIC: Cranial nerves II through XII are intact. Muscle strength 5/5 in all extremities. Sensation intact. Gait not checked.  PSYCHIATRIC: The patient is alert and oriented x 3.  SKIN: No obvious rash, lesion, or ulcer.   DATA REVIEW:   CBC Recent Labs  Lab 06/14/17 0442  WBC 7.2  HGB 11.6*  HCT 35.1  PLT 207    Chemistries  Recent Labs  Lab 06/13/17 0441  06/16/17 0422  NA 137   < > 140  K 5.2*   < > 4.9  CL 105   < > 104  CO2 25   < > 29  GLUCOSE 150*   < > 123*  BUN 30*   < > 42*  CREATININE 1.88*   < > 1.87*  CALCIUM 8.7*   < > 8.7*  MG 2.0  --   --    < > = values in this interval not displayed.    Cardiac Enzymes Recent Labs  Lab 06/12/17 0701  TROPONINI <0.03    Microbiology Results  Results for orders placed or performed during the hospital encounter of 06/12/17  MRSA PCR Screening     Status: None   Collection Time: 06/12/17  8:59 AM  Result Value Ref Range Status   MRSA by PCR NEGATIVE NEGATIVE Final    Comment:        The GeneXpert MRSA Assay (FDA approved for NASAL specimens only), is one component of a comprehensive MRSA colonization surveillance program.  It is not intended to diagnose MRSA infection nor to guide or monitor treatment for MRSA infections. Performed at [facility], [address] [place], [place] [address]   Culture, expectorated sputum-assessment     Status: None  Collection Time: 06/12/17 11:58 PM  Result Value Ref Range Status   Specimen Description SPUTUM  Final   Special Requests NONE  Final   Sputum evaluation   Final    THIS SPECIMEN IS ACCEPTABLE FOR SPUTUM CULTURE Performed at [facility], [address] [place], [place] [address]    Report Status 06/13/2017 FINAL  Final  Culture, respiratory (NON-Expectorated)     Status: None   Collection Time: 06/12/17 11:58 PM  Result Value Ref Range Status   Specimen Description   Final    SPUTUM Performed at [facility], [address] [place], [place] [address]    Special Requests   Final    NONE Reflexed from [id number] Performed at [facility], [address] [place], [place] [address]    Gram Stain   Final    RARE WBC PRESENT, PREDOMINANTLY PMN FEW YEAST RARE GRAM POSITIVE RODS RARE GRAM POSITIVE COCCI Performed at [facility], [place] [address] [place], [place] [address]    Culture FEW CANDIDA TROPICALIS  Final   Report Status 06/15/2017 FINAL  Final    RADIOLOGY:  Dg Chest Port 1 View  Result Date: 06/13/2017 CLINICAL DATA:  Difficulty breathing EXAM: PORTABLE CHEST 1 VIEW COMPARISON:  June 12, 2017 FINDINGS: There is mild atelectatic change in the right lower lobe. There is no edema or consolidation. Heart is upper normal size with pulmonary vascularity within normal limits. There is aortic atherosclerosis. No adenopathy. No bone lesions. IMPRESSION: Mild right lower lobe atelectatic change. No edema or consolidation. Heart upper normal in size. There is aortic atherosclerosis. Aortic Atherosclerosis (ICD10-I70.0). Electronically Signed   By: Lowella Grip III M.D.   On: 06/13/2017 07:46    EKG:    Orders placed or performed during the hospital encounter of 06/12/17  . ED EKG  . ED EKG      Management plans discussed with the patient, family and they are in agreement.  CODE STATUS:     Code Status Orders  (From admission, onward)        Start     Ordered   06/12/17 0859  Do not attempt resuscitation (DNR)  Continuous    Question Answer Comment  In the event of cardiac or respiratory ARREST Do not call a "code blue"   In the event of cardiac or respiratory ARREST Do not perform Intubation, CPR, defibrillation or ACLS   In the event of cardiac or respiratory ARREST Use medication by any route, position, wound care, and other measures to relive pain and suffering. May use oxygen, suction and manual treatment of airway obstruction as needed for comfort.   Comments MOST form in chart      06/12/17 0858    Code Status History    Date Active Date Inactive Code Status Order ID Comments User Context   05/26/2017 1732 05/30/2017 1446 DNR [id number]  Hillary Bow, MD ED   05/17/2017 1228 05/21/2017 1511 DNR [id number]  Asencion Gowda, NP Inpatient   04/21/2017 0117 04/22/2017 1750 DNR [id number]  Lance Coon, MD Inpatient   04/05/2017 1411 04/08/2017 2101 Full Code [id number]  Saundra Shelling, MD Inpatient   02/08/2016 1414 02/11/2016 1729 Full Code [id number]  Hessie Knows, MD Inpatient   11/27/2015 1645 11/29/2015 1628 Full Code [id number]  Idelle Crouch, MD Inpatient   02/10/2015 2122 02/12/2015 1809 Full Code [id number]  Lytle Butte, MD ED   09/16/2014 1220 09/19/2014 1736 Full Code [id number]  Aldean Jewett,  MD Inpatient      TOTAL TIME TAKING CARE OF THIS PATIENT: 43 minutes.   Note: This dictation was prepared with Dragon dictation along with smaller phrase technology. Any transcriptional errors that result from this process are unintentional.   @[facility] @  on 06/16/2017 at 1:29 PM  Between 7am to 6pm - Pager - [phone]  After 6pm go to [email or web address] - password [email or web address]  [facility]  Office  [phone]  CC: Primary care physician; Katherine Haddock, NP

## 2017-06-16 NOTE — Progress Notes (Signed)
Pt ambulated with 2L 02 with an oxygen saturation of 91% at her lowest point and 95% at her highest point.

## 2017-06-16 NOTE — Care Management Note (Signed)
Case Management Note  Patient Details  Name: Katherine [name] MRN: [id number] Date of Birth: May 13, 1940   [facility] Notified of discharge   Subjective/Objective:                    Action/Plan:   Expected Discharge Date:  06/16/17               Expected Discharge Plan:  [place]  In-House Referral:     Discharge planning Services  CM Consult  Post Acute Care Choice:  Resumption of Svcs/PTA Provider, Home Health Choice offered to:     DME Arranged:    DME Agency:     HH Arranged:  RN, PT, Social Work [employer] Agency:  [place]  Status of Service:  Completed, signed off  If discussed at [employer], dates discussed:    Additional Comments:  Beverly Sessions, RN 06/16/2017, 10:21 AM

## 2017-06-17 DIAGNOSIS — K219 Gastro-esophageal reflux disease without esophagitis: Secondary | ICD-10-CM | POA: Diagnosis not present

## 2017-06-17 DIAGNOSIS — F17210 Nicotine dependence, cigarettes, uncomplicated: Secondary | ICD-10-CM | POA: Diagnosis not present

## 2017-06-17 DIAGNOSIS — I2510 Atherosclerotic heart disease of native coronary artery without angina pectoris: Secondary | ICD-10-CM | POA: Diagnosis not present

## 2017-06-17 DIAGNOSIS — G4733 Obstructive sleep apnea (adult) (pediatric): Secondary | ICD-10-CM | POA: Diagnosis not present

## 2017-06-17 DIAGNOSIS — N183 Chronic kidney disease, stage 3 (moderate): Secondary | ICD-10-CM | POA: Diagnosis not present

## 2017-06-17 DIAGNOSIS — F329 Major depressive disorder, single episode, unspecified: Secondary | ICD-10-CM | POA: Diagnosis not present

## 2017-06-17 DIAGNOSIS — J439 Emphysema, unspecified: Secondary | ICD-10-CM | POA: Diagnosis not present

## 2017-06-17 DIAGNOSIS — I129 Hypertensive chronic kidney disease with stage 1 through stage 4 chronic kidney disease, or unspecified chronic kidney disease: Secondary | ICD-10-CM | POA: Diagnosis not present

## 2017-06-17 DIAGNOSIS — M1990 Unspecified osteoarthritis, unspecified site: Secondary | ICD-10-CM | POA: Diagnosis not present

## 2017-06-18 ENCOUNTER — Telehealth: Payer: Self-pay | Admitting: Unknown Physician Specialty

## 2017-06-18 ENCOUNTER — Other Ambulatory Visit: Payer: Self-pay | Admitting: *Deleted

## 2017-06-18 ENCOUNTER — Telehealth: Payer: Self-pay

## 2017-06-18 NOTE — Telephone Encounter (Signed)
I have made the 2nd attempt to contact the patient or family member in charge, in order to follow up from recently being discharged from the hospital. I left a message on voicemail. 

## 2017-06-18 NOTE — Telephone Encounter (Signed)
Routing to provider for verbal orders 

## 2017-06-18 NOTE — Patient Outreach (Addendum)
[facility]) Care Management  06/18/2017  Hae Ahlers 08-Jan-1941 [id number]  Successful telephone encounter for Katherine [name], 77 year old female for follow up on current  Clinical status- recent hospitalization April 30- May 4,2019 for COPD exacerbation, acute on  Chronic respiratory failure with hypoxia.   View in EMR pt had 3 admits < 30 days.  PCP office on  List for doing transition of care call.   Spoke with spouse (on [facility] consent form), HIPAA identifiers provided on pt, reports pt is currently resting, doing fine since discharge home.   Spouse reports pt  has no sob, continues on 2 liters , has not used her rescue inhaler since discharge home, taking all of her medications as ordered/nebulizer treatments as needed- helping.  Discussed with spouse outcome of pt's oxygen tanks (spouse called RN CM on 06/08/17) to which spouse reports they were switched out.   Pt's discharge medications reviewed with spouse, includes Prednisone taper and antibiotic.   Spouse reports HH RN came today.  Spouse reports on MD follow up visits- to call PCP and Lung MD office today and make follow up  Appointments.     Plan:  As discussed with spouse, plan to follow up again 07/06/17 for home visit.   [name].   [facility]  [phone]

## 2017-06-18 NOTE — Telephone Encounter (Signed)
Copied from [place]. Topic: Quick Communication - See Telephone Encounter >> Jun 18, 2017  9:26 AM Arletha Grippe wrote: CRM for notification. See Telephone encounter for: 06/18/17. Requesting verbal orders -  Resumption of care and to continue previous orders 3 week 1  2 week 1 1 week 3 Cb is [phone] Leave message if no answer  They can not see pt again until orders are authorized

## 2017-06-18 NOTE — Telephone Encounter (Signed)
I have made the 1st attempt to contact the patient or family member in charge, in order to follow up from recently being discharged from the hospital. I left a message on voicemail but I will make another attempt at a different time.   DIRECT CALL BACK- [phone]

## 2017-06-18 NOTE — Telephone Encounter (Signed)
OK/verbal orders

## 2017-06-18 NOTE — Telephone Encounter (Signed)
Called and gave the verbal [facility] for orders per [place].

## 2017-06-19 ENCOUNTER — Telehealth: Payer: Self-pay | Admitting: Unknown Physician Specialty

## 2017-06-19 DIAGNOSIS — M1990 Unspecified osteoarthritis, unspecified site: Secondary | ICD-10-CM | POA: Diagnosis not present

## 2017-06-19 DIAGNOSIS — I129 Hypertensive chronic kidney disease with stage 1 through stage 4 chronic kidney disease, or unspecified chronic kidney disease: Secondary | ICD-10-CM | POA: Diagnosis not present

## 2017-06-19 DIAGNOSIS — G4733 Obstructive sleep apnea (adult) (pediatric): Secondary | ICD-10-CM | POA: Diagnosis not present

## 2017-06-19 DIAGNOSIS — F329 Major depressive disorder, single episode, unspecified: Secondary | ICD-10-CM | POA: Diagnosis not present

## 2017-06-19 DIAGNOSIS — J439 Emphysema, unspecified: Secondary | ICD-10-CM | POA: Diagnosis not present

## 2017-06-19 DIAGNOSIS — N183 Chronic kidney disease, stage 3 (moderate): Secondary | ICD-10-CM | POA: Diagnosis not present

## 2017-06-19 DIAGNOSIS — F17210 Nicotine dependence, cigarettes, uncomplicated: Secondary | ICD-10-CM | POA: Diagnosis not present

## 2017-06-19 DIAGNOSIS — K219 Gastro-esophageal reflux disease without esophagitis: Secondary | ICD-10-CM | POA: Diagnosis not present

## 2017-06-19 DIAGNOSIS — I2510 Atherosclerotic heart disease of native coronary artery without angina pectoris: Secondary | ICD-10-CM | POA: Diagnosis not present

## 2017-06-19 NOTE — Telephone Encounter (Signed)
Routing to provider  

## 2017-06-19 NOTE — Telephone Encounter (Signed)
Called and left Raquel Sarna a VM letting her know that Malachy Mood gave the OK for verbal orders.

## 2017-06-19 NOTE — Telephone Encounter (Signed)
OK 

## 2017-06-19 NOTE — Telephone Encounter (Signed)
Copied from [place]. Topic: Quick Communication - See Telephone Encounter >> Jun 19, 2017  2:45 PM Synthia Innocent wrote: CRM for notification. See Telephone encounter for: 06/19/17. Requesting verbal orders for PT, 2x a week for 3 weeks

## 2017-06-20 DIAGNOSIS — K219 Gastro-esophageal reflux disease without esophagitis: Secondary | ICD-10-CM | POA: Diagnosis not present

## 2017-06-20 DIAGNOSIS — I2510 Atherosclerotic heart disease of native coronary artery without angina pectoris: Secondary | ICD-10-CM | POA: Diagnosis not present

## 2017-06-20 DIAGNOSIS — N183 Chronic kidney disease, stage 3 (moderate): Secondary | ICD-10-CM | POA: Diagnosis not present

## 2017-06-20 DIAGNOSIS — I129 Hypertensive chronic kidney disease with stage 1 through stage 4 chronic kidney disease, or unspecified chronic kidney disease: Secondary | ICD-10-CM | POA: Diagnosis not present

## 2017-06-20 DIAGNOSIS — M1990 Unspecified osteoarthritis, unspecified site: Secondary | ICD-10-CM | POA: Diagnosis not present

## 2017-06-20 DIAGNOSIS — J439 Emphysema, unspecified: Secondary | ICD-10-CM | POA: Diagnosis not present

## 2017-06-20 DIAGNOSIS — F329 Major depressive disorder, single episode, unspecified: Secondary | ICD-10-CM | POA: Diagnosis not present

## 2017-06-20 DIAGNOSIS — F17210 Nicotine dependence, cigarettes, uncomplicated: Secondary | ICD-10-CM | POA: Diagnosis not present

## 2017-06-20 DIAGNOSIS — G4733 Obstructive sleep apnea (adult) (pediatric): Secondary | ICD-10-CM | POA: Diagnosis not present

## 2017-06-22 DIAGNOSIS — G4733 Obstructive sleep apnea (adult) (pediatric): Secondary | ICD-10-CM | POA: Diagnosis not present

## 2017-06-22 DIAGNOSIS — M1990 Unspecified osteoarthritis, unspecified site: Secondary | ICD-10-CM | POA: Diagnosis not present

## 2017-06-22 DIAGNOSIS — I129 Hypertensive chronic kidney disease with stage 1 through stage 4 chronic kidney disease, or unspecified chronic kidney disease: Secondary | ICD-10-CM | POA: Diagnosis not present

## 2017-06-22 DIAGNOSIS — I2510 Atherosclerotic heart disease of native coronary artery without angina pectoris: Secondary | ICD-10-CM | POA: Diagnosis not present

## 2017-06-22 DIAGNOSIS — F17210 Nicotine dependence, cigarettes, uncomplicated: Secondary | ICD-10-CM | POA: Diagnosis not present

## 2017-06-22 DIAGNOSIS — J439 Emphysema, unspecified: Secondary | ICD-10-CM | POA: Diagnosis not present

## 2017-06-22 DIAGNOSIS — K219 Gastro-esophageal reflux disease without esophagitis: Secondary | ICD-10-CM | POA: Diagnosis not present

## 2017-06-22 DIAGNOSIS — N183 Chronic kidney disease, stage 3 (moderate): Secondary | ICD-10-CM | POA: Diagnosis not present

## 2017-06-22 DIAGNOSIS — F329 Major depressive disorder, single episode, unspecified: Secondary | ICD-10-CM | POA: Diagnosis not present

## 2017-06-24 ENCOUNTER — Other Ambulatory Visit: Payer: Self-pay | Admitting: Family Medicine

## 2017-06-24 ENCOUNTER — Other Ambulatory Visit: Payer: Self-pay | Admitting: Unknown Physician Specialty

## 2017-06-24 NOTE — Telephone Encounter (Signed)
Your patient 

## 2017-06-26 ENCOUNTER — Telehealth: Payer: Self-pay | Admitting: Unknown Physician Specialty

## 2017-06-26 NOTE — Telephone Encounter (Signed)
I would use the Doxycycline one.  But, if she needs it now, use the Zithromax and then order the second one to have on hand

## 2017-06-26 NOTE — Telephone Encounter (Signed)
Copied from [place] [id number]. Topic: Quick Communication - See Telephone Encounter >> Jun 26, 2017 11:23 AM Bea Graff, NT wrote: CRM for notification. See Telephone encounter for: 06/26/17. Mary with [facility] is calling to see which COPD kit they should use for this pt. She states that the 1st was ordered with zithromax and the 2nd one was ordered with doxycycline. The patient has the 1st order already in her home and then the second order was sent in by Dr. Leslye Peer. The 1st order was from Dr. Bridgett Larsson. CB#: [phone]

## 2017-06-26 NOTE — Telephone Encounter (Signed)
[facility] notified of Katherine [name]'s instructions.

## 2017-06-26 NOTE — Telephone Encounter (Signed)
Routing to provider. Please advise.

## 2017-06-27 ENCOUNTER — Telehealth: Payer: Self-pay | Admitting: Unknown Physician Specialty

## 2017-06-27 IMAGING — US US RENAL
1 series · 14 of 25 positions shown · non-contrast
Comparison: CT 09/16/2014

CLINICAL DATA: Renal lesion

EXAM:
RENAL / URINARY TRACT ULTRASOUND COMPLETE

[Series 1: us renal · 0.24mm/px · 14 of 29 slices shown]
[im 1/29]
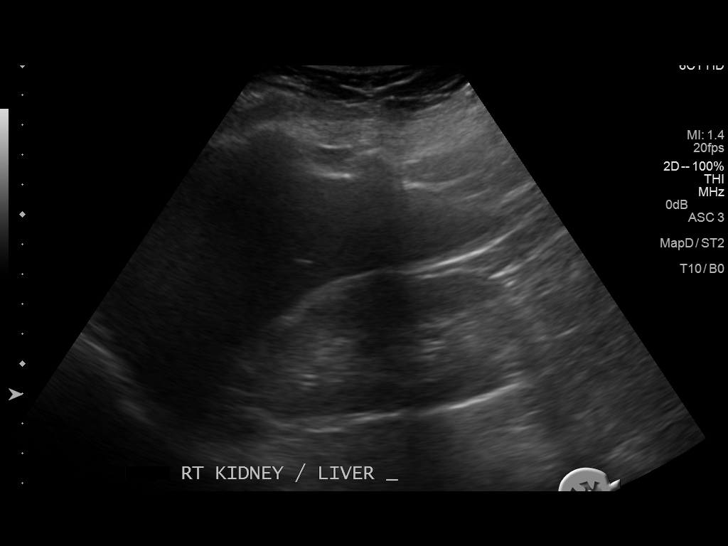
[im 3/29]
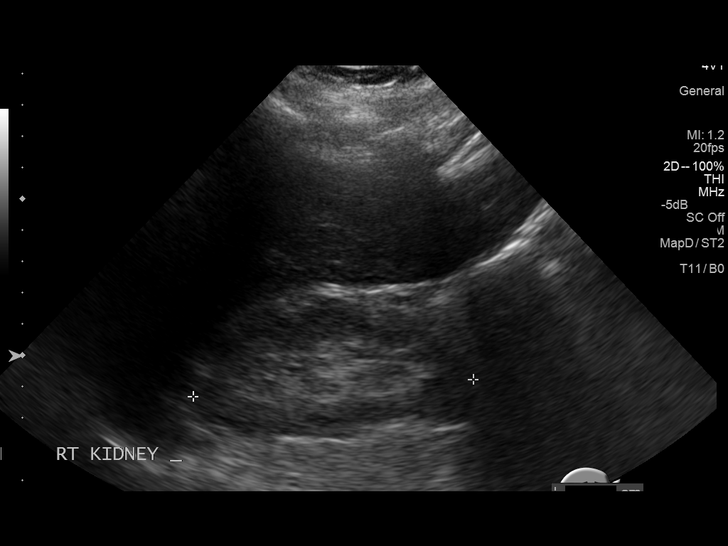
[im 5/29]
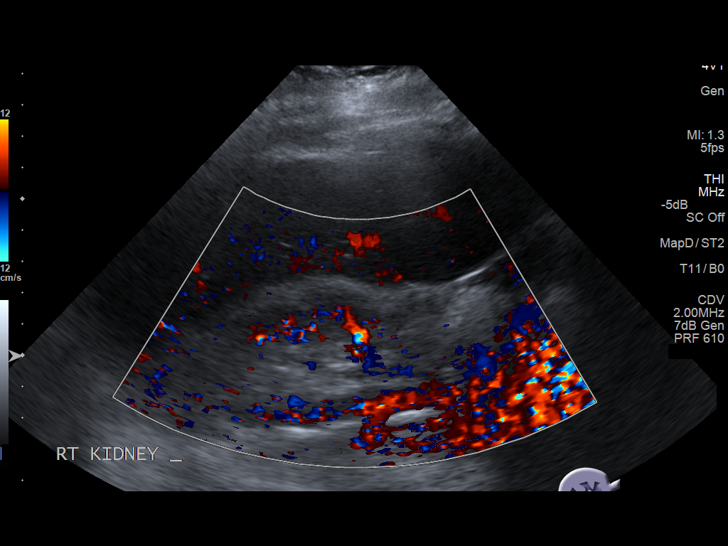
[im 8/29]
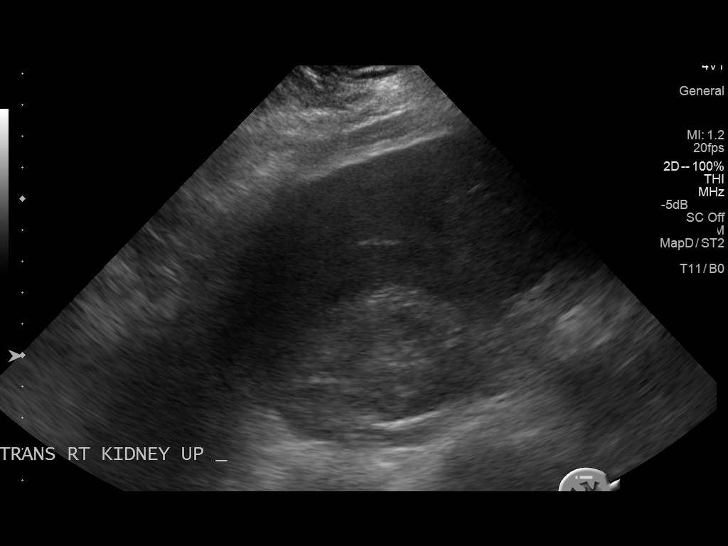
[im 10/29]
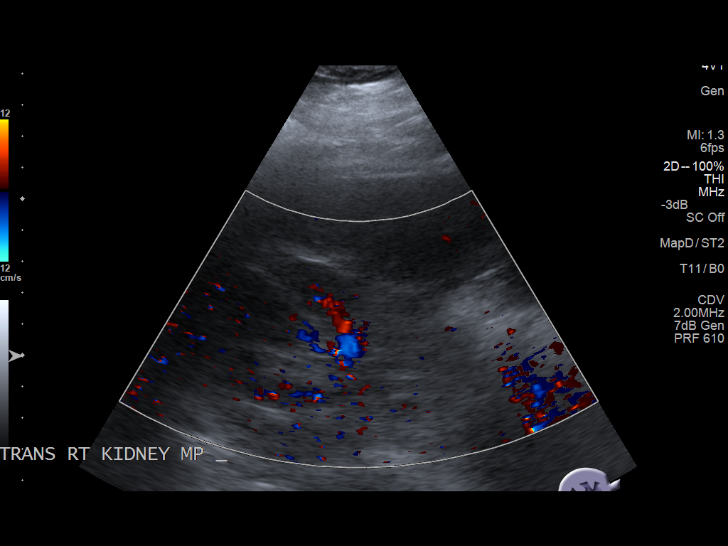
[im 11/29]
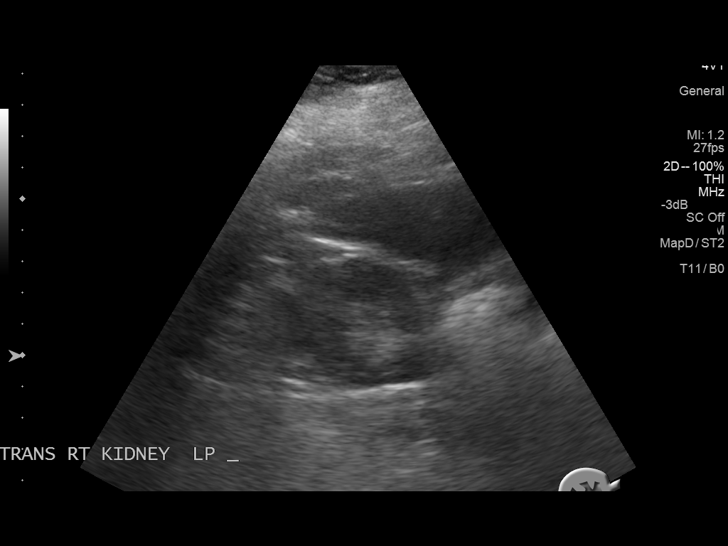
[im 13/29]
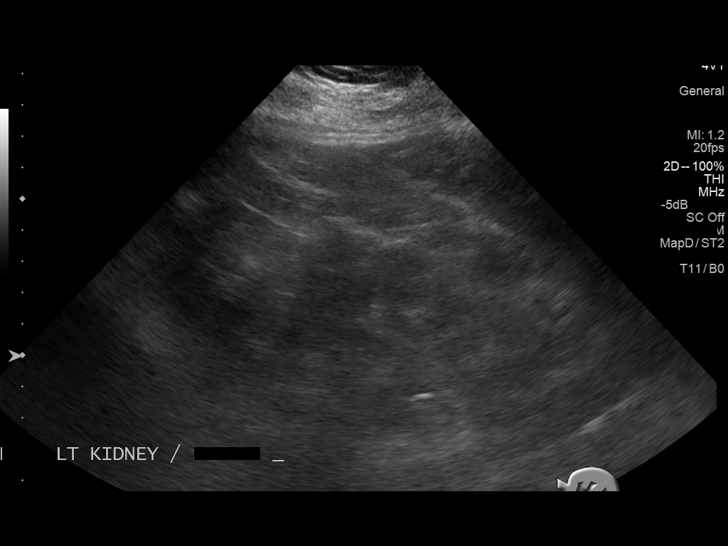
[im 16/29]
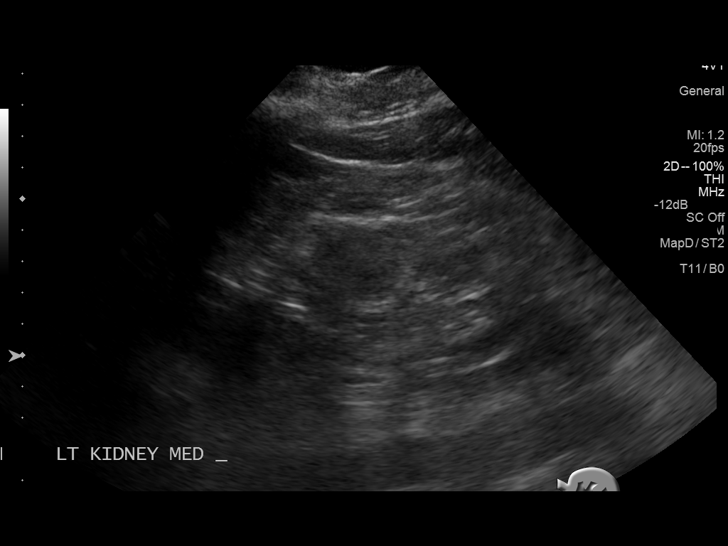
[im 18/29]
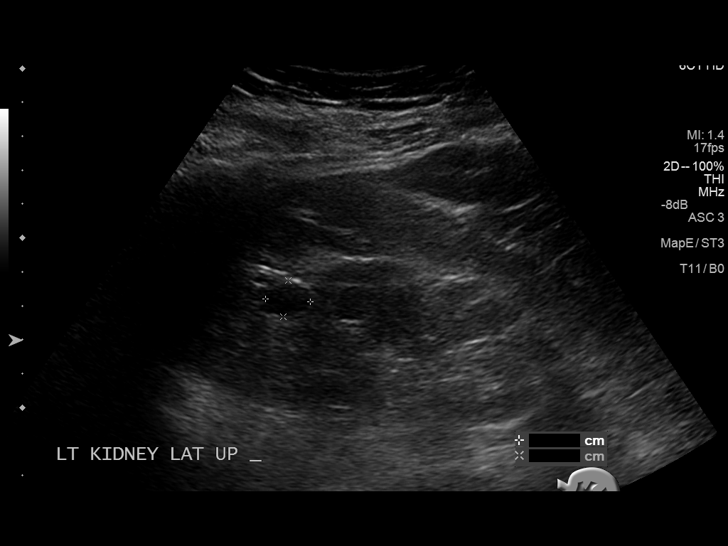
[im 19/29]
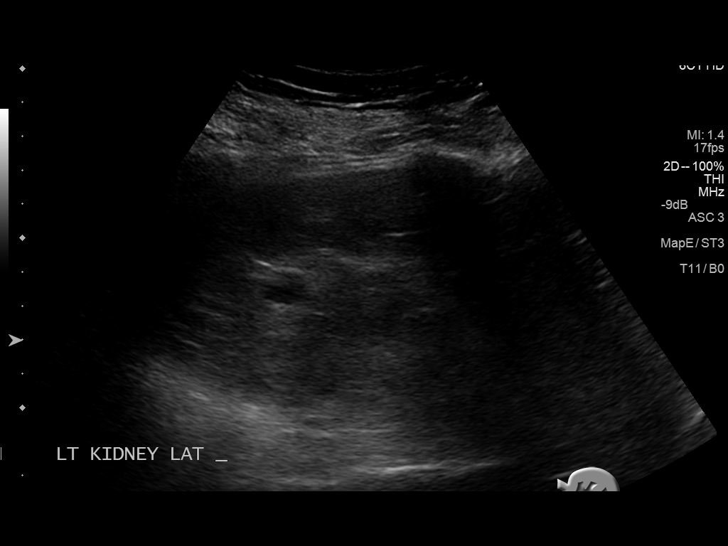
[im 22/29]
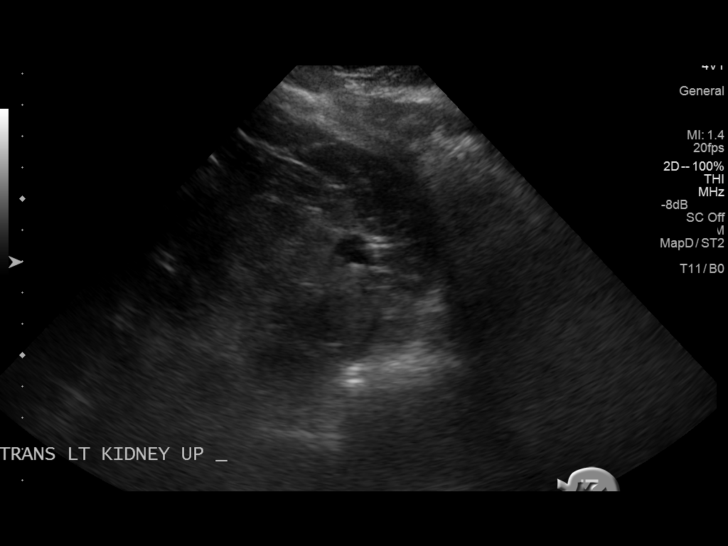
[im 24/29]
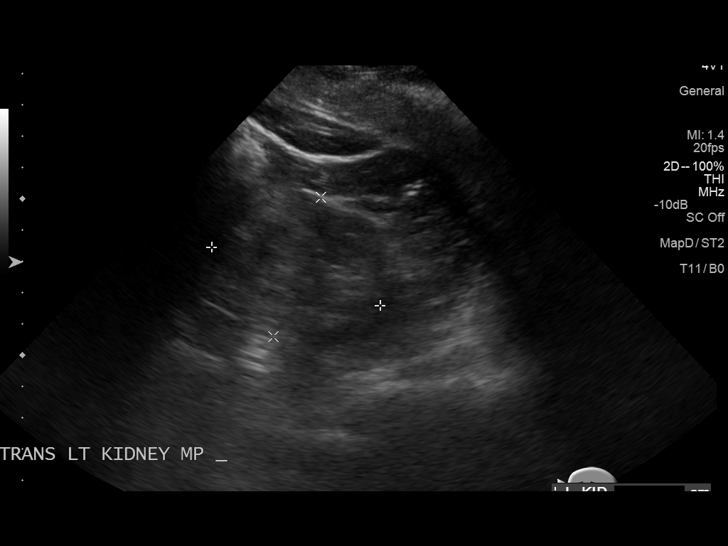
[im 26/29]
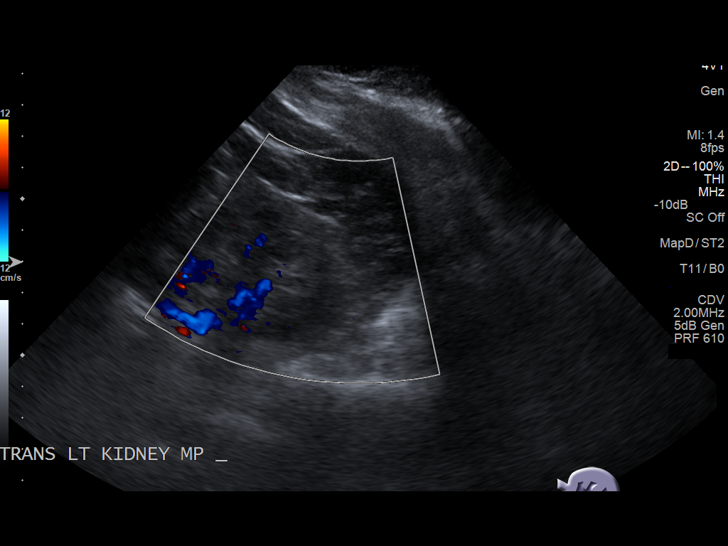
[im 29/29]
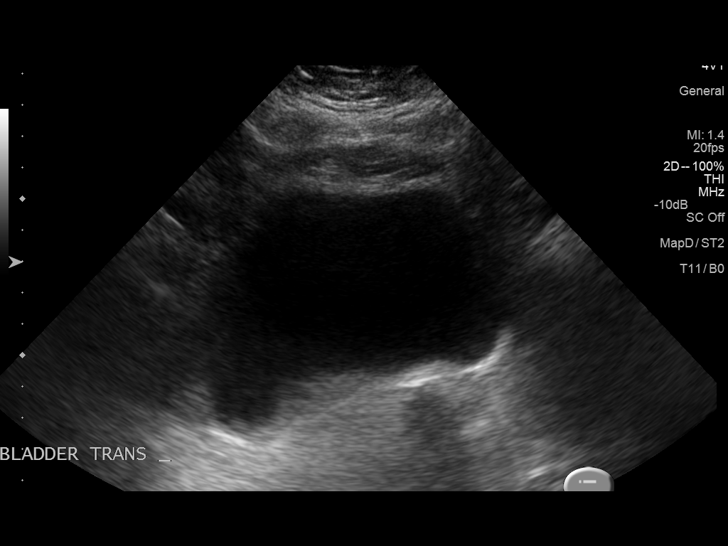

[14 of 25 positions shown; findings below may reference images not displayed]

FINDINGS: Right Kidney:

Length: 10.1 cm. Echogenicity within normal limits. No mass or
hydronephrosis visualized.

Left Kidney:

Length: 10.4 cm. 12 mm cyst left upper pole corresponds to the
low-density lesion on CT.. Echogenicity within normal limits. No
mass or hydronephrosis visualized.

Bladder:

Negative
IMPRESSION: 12 mm left upper pole cyst corresponds to the CT abnormality. No
mass or renal obstruction identified.

## 2017-06-27 NOTE — Telephone Encounter (Signed)
Copied from [place] [id number]. Topic: Quick Communication - See Telephone Encounter >> Jun 26, 2017 11:23 AM Bea Graff, NT wrote: CRM for notification. See Telephone encounter for: 06/26/17. Mary with [facility] is calling to see which COPD kit they should use for this pt. She states that the 1st was ordered with zithromax and the 2nd one was ordered with doxycycline. The patient has the 1st order already in her home and then the second order was sent in by Dr. Leslye Peer. The 1st order was from Dr. Bridgett Larsson. CB#: [phone]

## 2017-06-27 NOTE — Telephone Encounter (Signed)
See telephone encounter from yesterday with the exact same message. Called Mary back and let her know what Malachy Mood said in other encounter.

## 2017-06-28 ENCOUNTER — Observation Stay
Admission: EM | Admit: 2017-06-28 | Discharge: 2017-06-30 | Disposition: A | Discharge status: Home / Self Care | Payer: Medicare HMO | Attending: Internal Medicine | Admitting: Internal Medicine

## 2017-06-28 ENCOUNTER — Other Ambulatory Visit: Payer: Self-pay | Admitting: *Deleted

## 2017-06-28 ENCOUNTER — Inpatient Hospital Stay: Payer: Medicare HMO

## 2017-06-28 ENCOUNTER — Other Ambulatory Visit: Payer: Self-pay

## 2017-06-28 ENCOUNTER — Inpatient Hospital Stay (HOSPITAL_COMMUNITY)
Admit: 2017-06-28 | Discharge: 2017-06-28 | Disposition: A | Discharge status: Home / Self Care | Payer: Medicare HMO | Attending: Family Medicine | Admitting: Family Medicine

## 2017-06-28 ENCOUNTER — Emergency Department: Payer: Medicare HMO

## 2017-06-28 DIAGNOSIS — Z8673 Personal history of transient ischemic attack (TIA), and cerebral infarction without residual deficits: Secondary | ICD-10-CM | POA: Insufficient documentation

## 2017-06-28 DIAGNOSIS — I671 Cerebral aneurysm, nonruptured: Secondary | ICD-10-CM | POA: Diagnosis not present

## 2017-06-28 DIAGNOSIS — E785 Hyperlipidemia, unspecified: Secondary | ICD-10-CM | POA: Insufficient documentation

## 2017-06-28 DIAGNOSIS — R471 Dysarthria and anarthria: Secondary | ICD-10-CM | POA: Diagnosis not present

## 2017-06-28 DIAGNOSIS — Z6841 Body Mass Index (BMI) 40.0 and over, adult: Secondary | ICD-10-CM | POA: Insufficient documentation

## 2017-06-28 DIAGNOSIS — Z66 Do not resuscitate: Secondary | ICD-10-CM | POA: Diagnosis not present

## 2017-06-28 DIAGNOSIS — I5030 Unspecified diastolic (congestive) heart failure: Secondary | ICD-10-CM | POA: Diagnosis not present

## 2017-06-28 DIAGNOSIS — N183 Chronic kidney disease, stage 3 (moderate): Secondary | ICD-10-CM | POA: Diagnosis not present

## 2017-06-28 DIAGNOSIS — J9610 Chronic respiratory failure, unspecified whether with hypoxia or hypercapnia: Secondary | ICD-10-CM | POA: Insufficient documentation

## 2017-06-28 DIAGNOSIS — Z9981 Dependence on supplemental oxygen: Secondary | ICD-10-CM | POA: Insufficient documentation

## 2017-06-28 DIAGNOSIS — G459 Transient cerebral ischemic attack, unspecified: Secondary | ICD-10-CM

## 2017-06-28 DIAGNOSIS — I129 Hypertensive chronic kidney disease with stage 1 through stage 4 chronic kidney disease, or unspecified chronic kidney disease: Secondary | ICD-10-CM | POA: Insufficient documentation

## 2017-06-28 DIAGNOSIS — F329 Major depressive disorder, single episode, unspecified: Secondary | ICD-10-CM | POA: Insufficient documentation

## 2017-06-28 DIAGNOSIS — I2510 Atherosclerotic heart disease of native coronary artery without angina pectoris: Secondary | ICD-10-CM | POA: Insufficient documentation

## 2017-06-28 DIAGNOSIS — G4733 Obstructive sleep apnea (adult) (pediatric): Secondary | ICD-10-CM | POA: Insufficient documentation

## 2017-06-28 DIAGNOSIS — Z96651 Presence of right artificial knee joint: Secondary | ICD-10-CM | POA: Insufficient documentation

## 2017-06-28 DIAGNOSIS — E875 Hyperkalemia: Secondary | ICD-10-CM | POA: Insufficient documentation

## 2017-06-28 DIAGNOSIS — Z7982 Long term (current) use of aspirin: Secondary | ICD-10-CM | POA: Diagnosis not present

## 2017-06-28 DIAGNOSIS — R1310 Dysphagia, unspecified: Secondary | ICD-10-CM | POA: Diagnosis present

## 2017-06-28 DIAGNOSIS — Z87891 Personal history of nicotine dependence: Secondary | ICD-10-CM | POA: Insufficient documentation

## 2017-06-28 DIAGNOSIS — J449 Chronic obstructive pulmonary disease, unspecified: Secondary | ICD-10-CM | POA: Diagnosis not present

## 2017-06-28 DIAGNOSIS — R4701 Aphasia: Secondary | ICD-10-CM | POA: Diagnosis not present

## 2017-06-28 DIAGNOSIS — R000 Tachycardia, unspecified: Secondary | ICD-10-CM | POA: Diagnosis not present

## 2017-06-28 DIAGNOSIS — Z79899 Other long term (current) drug therapy: Secondary | ICD-10-CM | POA: Insufficient documentation

## 2017-06-28 DIAGNOSIS — I6529 Occlusion and stenosis of unspecified carotid artery: Secondary | ICD-10-CM | POA: Diagnosis not present

## 2017-06-28 DIAGNOSIS — R4781 Slurred speech: Secondary | ICD-10-CM | POA: Diagnosis not present

## 2017-06-28 DIAGNOSIS — R4182 Altered mental status, unspecified: Secondary | ICD-10-CM | POA: Diagnosis not present

## 2017-06-28 DIAGNOSIS — I639 Cerebral infarction, unspecified: Secondary | ICD-10-CM

## 2017-06-28 DIAGNOSIS — R29818 Other symptoms and signs involving the nervous system: Secondary | ICD-10-CM | POA: Diagnosis not present

## 2017-06-28 HISTORY — DX: Aphasia: R47.01

## 2017-06-28 LAB — URINALYSIS, ROUTINE W REFLEX MICROSCOPIC
BILIRUBIN URINE: NEGATIVE
Glucose, UA: NEGATIVE mg/dL
Hgb urine dipstick: NEGATIVE
Ketones, ur: NEGATIVE mg/dL
Nitrite: NEGATIVE
PH: 5 (ref 5.0–8.0)
Protein, ur: NEGATIVE mg/dL
Specific Gravity, Urine: 1.012 (ref 1.005–1.030)

## 2017-06-28 LAB — CBC WITH DIFFERENTIAL/PLATELET
BASOS PCT: 1 %
Basophils Absolute: 0 10*3/uL (ref 0–0.1)
EOS ABS: 0 10*3/uL (ref 0–0.7)
Eosinophils Relative: 0 %
HCT: 36.8 % (ref 35.0–47.0)
Hemoglobin: 12.6 g/dL (ref 12.0–16.0)
LYMPHS ABS: 1.5 10*3/uL (ref 1.0–3.6)
Lymphocytes Relative: 28 %
MCH: 32.5 pg (ref 26.0–34.0)
MCHC: 34.2 g/dL (ref 32.0–36.0)
MCV: 95.1 fL (ref 80.0–100.0)
MONOS PCT: 9 %
Monocytes Absolute: 0.5 10*3/uL (ref 0.2–0.9)
Neutro Abs: 3.3 10*3/uL (ref 1.4–6.5)
Neutrophils Relative %: 62 %
PLATELETS: 181 10*3/uL (ref 150–440)
RBC: 3.87 MIL/uL (ref 3.80–5.20)
RDW: 15.3 % — ABNORMAL HIGH (ref 11.5–14.5)
WBC: 5.4 10*3/uL (ref 3.6–11.0)

## 2017-06-28 LAB — COMPREHENSIVE METABOLIC PANEL
ALBUMIN: 3.4 g/dL — AB (ref 3.5–5.0)
ALK PHOS: 67 U/L (ref 38–126)
ALT: 17 U/L (ref 14–54)
ALT: 17 U/L (ref 14–54)
AST: 22 U/L (ref 15–41)
AST: 18 U/L (ref 15–41)
Albumin: 3.4 g/dL — ABNORMAL LOW (ref 3.5–5.0)
Alkaline Phosphatase: 64 U/L (ref 38–126)
Anion gap: 7 (ref 5–15)
Anion gap: 7 (ref 5–15)
BUN: 20 mg/dL (ref 6–20)
BUN: 21 mg/dL — AB (ref 6–20)
CALCIUM: 9.2 mg/dL (ref 8.9–10.3)
CHLORIDE: 108 mmol/L (ref 101–111)
CHLORIDE: 109 mmol/L (ref 101–111)
CO2: 28 mmol/L (ref 22–32)
CO2: 27 mmol/L (ref 22–32)
Calcium: 9.3 mg/dL (ref 8.9–10.3)
Creatinine, Ser: 1.73 mg/dL — ABNORMAL HIGH (ref 0.44–1.00)
Creatinine, Ser: 1.69 mg/dL — ABNORMAL HIGH (ref 0.44–1.00)
GFR calc Af Amer: 32 mL/min — ABNORMAL LOW (ref >60)
GFR calc Af Amer: 33 mL/min — ABNORMAL LOW (ref >60)
GFR, EST NON AFRICAN AMERICAN: 28 mL/min — AB (ref >60)
GFR, EST NON AFRICAN AMERICAN: 28 mL/min — AB (ref >60)
GLUCOSE: 121 mg/dL — AB (ref 65–99)
Glucose, Bld: 114 mg/dL — ABNORMAL HIGH (ref 65–99)
POTASSIUM: 5.1 mmol/L (ref 3.5–5.1)
Potassium: 4.8 mmol/L (ref 3.5–5.1)
Sodium: 143 mmol/L (ref 135–145)
Sodium: 143 mmol/L (ref 135–145)
Total Bilirubin: 0.8 mg/dL (ref 0.3–1.2)
Total Bilirubin: 0.5 mg/dL (ref 0.3–1.2)
Total Protein: 6.7 g/dL (ref 6.5–8.1)
Total Protein: 6.7 g/dL (ref 6.5–8.1)

## 2017-06-28 LAB — URINE DRUG SCREEN, QUALITATIVE (ARMC ONLY)
AMPHETAMINES, UR SCREEN: NOT DETECTED
Barbiturates, Ur Screen: NOT DETECTED
Benzodiazepine, Ur Scrn: NOT DETECTED
CANNABINOID 50 NG, UR ~~LOC~~: NOT DETECTED
COCAINE METABOLITE, UR ~~LOC~~: NOT DETECTED
MDMA (ECSTASY) UR SCREEN: NOT DETECTED
Methadone Scn, Ur: NOT DETECTED
Opiate, Ur Screen: NOT DETECTED
Phencyclidine (PCP) Ur S: NOT DETECTED
TRICYCLIC, UR SCREEN: NOT DETECTED

## 2017-06-28 LAB — ECHOCARDIOGRAM COMPLETE
HEIGHTINCHES: 61 in
Weight: 3984 oz

## 2017-06-28 LAB — TROPONIN I

## 2017-06-28 LAB — ETHANOL

## 2017-06-28 LAB — GLUCOSE, CAPILLARY: GLUCOSE-CAPILLARY: 106 mg/dL — AB (ref 65–99)

## 2017-06-28 MED ORDER — OMEPRAZOLE MAGNESIUM 20 MG PO TBEC: 20.0000 mg | DELAYED_RELEASE_TABLET | Freq: Every day | ORAL | Status: DC | PRN

## 2017-06-28 MED ORDER — CLOPIDOGREL BISULFATE 75 MG PO TABS
75.0000 mg | ORAL_TABLET | Freq: Every day | ORAL | Status: DC
  Administered 2017-06-28 – 2017-06-30 (×3): 75 mg via ORAL
  Filled 2017-06-28 (×3): qty 1

## 2017-06-28 MED ORDER — FLUOXETINE HCL 10 MG PO CAPS
10.0000 mg | ORAL_CAPSULE | Freq: Every day | ORAL | Status: DC
  Administered 2017-06-28 – 2017-06-30 (×3): 10 mg via ORAL
  Filled 2017-06-28 (×3): qty 1

## 2017-06-28 MED ORDER — HYDRALAZINE HCL 50 MG PO TABS
25.0000 mg | ORAL_TABLET | Freq: Two times a day (BID) | ORAL | Status: DC
  Administered 2017-06-28 – 2017-06-30 (×4): 25 mg via ORAL
  Filled 2017-06-28 (×4): qty 1

## 2017-06-28 MED ORDER — ACETAMINOPHEN 160 MG/5ML PO SOLN
650.0000 mg | ORAL | Status: DC | PRN
  Filled 2017-06-28: qty 20.3

## 2017-06-28 MED ORDER — ALBUTEROL SULFATE (2.5 MG/3ML) 0.083% IN NEBU: 2.5000 mg | INHALATION_SOLUTION | RESPIRATORY_TRACT | Status: DC | PRN

## 2017-06-28 MED ORDER — ACETAMINOPHEN ER 650 MG PO TBCR: 650.0000 mg | EXTENDED_RELEASE_TABLET | Freq: Three times a day (TID) | ORAL | Status: DC | PRN

## 2017-06-28 MED ORDER — STROKE: EARLY STAGES OF RECOVERY BOOK
Freq: Once | Status: AC
  Administered 2017-06-28: 15:00:00

## 2017-06-28 MED ORDER — HEPARIN SODIUM (PORCINE) 5000 UNIT/ML IJ SOLN
5000.0000 [IU] | Freq: Three times a day (TID) | INTRAMUSCULAR | Status: DC
  Administered 2017-06-28 – 2017-06-30 (×5): 5000 [IU] via SUBCUTANEOUS
  Filled 2017-06-28 (×5): qty 1

## 2017-06-28 MED ORDER — ASPIRIN 300 MG RE SUPP
300.0000 mg | Freq: Once | RECTAL | Status: AC
  Administered 2017-06-28: 300 mg via RECTAL
  Filled 2017-06-28: qty 1

## 2017-06-28 MED ORDER — ACETAMINOPHEN 325 MG PO TABS: 650.0000 mg | ORAL_TABLET | ORAL | Status: DC | PRN

## 2017-06-28 MED ORDER — HYDRALAZINE HCL 20 MG/ML IJ SOLN: 10.0000 mg | INTRAMUSCULAR | Status: DC | PRN

## 2017-06-28 MED ORDER — TIOTROPIUM BROMIDE MONOHYDRATE 18 MCG IN CAPS
18.0000 ug | ORAL_CAPSULE | Freq: Every day | RESPIRATORY_TRACT | Status: DC
  Administered 2017-06-28 – 2017-06-30 (×3): 18 ug via RESPIRATORY_TRACT
  Filled 2017-06-28: qty 5

## 2017-06-28 MED ORDER — ASPIRIN 81 MG PO CHEW: 324.0000 mg | CHEWABLE_TABLET | Freq: Once | ORAL | Status: DC

## 2017-06-28 MED ORDER — ATORVASTATIN CALCIUM 20 MG PO TABS
20.0000 mg | ORAL_TABLET | Freq: Every day | ORAL | Status: DC
  Administered 2017-06-28 – 2017-06-29 (×2): 20 mg via ORAL
  Filled 2017-06-28 (×2): qty 1

## 2017-06-28 MED ORDER — IPRATROPIUM-ALBUTEROL 0.5-2.5 (3) MG/3ML IN SOLN: 3.0000 mL | Freq: Four times a day (QID) | RESPIRATORY_TRACT | Status: DC | PRN

## 2017-06-28 MED ORDER — SENNOSIDES-DOCUSATE SODIUM 8.6-50 MG PO TABS: 1.0000 | ORAL_TABLET | Freq: Every evening | ORAL | Status: DC | PRN

## 2017-06-28 MED ORDER — PANTOPRAZOLE SODIUM 40 MG PO TBEC
40.0000 mg | DELAYED_RELEASE_TABLET | Freq: Every day | ORAL | Status: DC
  Administered 2017-06-28 – 2017-06-30 (×3): 40 mg via ORAL
  Filled 2017-06-28 (×3): qty 1

## 2017-06-28 MED ORDER — ACETAMINOPHEN 650 MG RE SUPP: 650.0000 mg | RECTAL | Status: DC | PRN

## 2017-06-28 MED ORDER — LORATADINE 10 MG PO TABS
10.0000 mg | ORAL_TABLET | Freq: Every day | ORAL | Status: DC
  Administered 2017-06-28 – 2017-06-30 (×3): 10 mg via ORAL
  Filled 2017-06-28 (×3): qty 1

## 2017-06-28 MED ORDER — ORAL CARE MOUTH RINSE
15.0000 mL | Freq: Two times a day (BID) | OROMUCOSAL | Status: DC
  Administered 2017-06-28 – 2017-06-30 (×5): 15 mL via OROMUCOSAL

## 2017-06-28 NOTE — Consult Note (Signed)
Referring Physician: Jimmye Norman    Chief Complaint: Difficulty with speech  HPI: Katherine [name] is an 77 y.o. female with multiple medical problems who went to bed at baseline last evening.  Per husband on awakening today was not able to speak well.  This has continued therefore patient was brought in for evaluation.  Initial NIHSS of 8. At baseline patient walks with a cane but is otherwise functional per family.    Date last known well: Date: 06/27/2017 Time last known well: Time: 21:00 tPA Given: No: Outside time window, improving symptoms  Past Medical History:  Diagnosis Date  . Angioedema   . Anxiety   . Anxiety and depression   . Chronic constipation   . Chronic kidney disease    stage 3  . COPD (chronic obstructive pulmonary disease) ([place])   . Gallstones   . GERD (gastroesophageal reflux disease)   . Hypertension   . Left ventricular hypertrophy   . Osteoarthritis   . Prurigo nodularis   . Tobacco abuse   . Vitamin D deficiency disease     Past Surgical History:  Procedure Laterality Date  . CHOLECYSTECTOMY    . 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 Alcohol abuse Mother   . Sickle cell anemia Daughter   . Hypertension Son   . Cancer Neg Hx   . COPD Neg Hx   . Diabetes Neg Hx   . Heart disease Neg Hx   . Stroke Neg Hx    Social History:  reports that she has quit smoking. Her smoking use included cigarettes. She has a 30.00 pack-year smoking history. She has never used smokeless tobacco. She reports that she does not drink alcohol or use drugs.  Allergies:  Allergies  Allergen Reactions  . Bee Venom Swelling  . Enalapril Maleate Swelling  . Other     Bee stings    Medications: I have reviewed the patient's current medications. Prior  to Admission:  Prior to Admission medications   Medication Sig Start Date End Date Taking? Authorizing Provider  albuterol (PROVENTIL HFA) 108 (90 Base) MCG/ACT inhaler Inhale 2 puffs into the lungs every 4 (four) hours as needed for wheezing or shortness of breath. 05/30/17  Yes Wieting, Richard, MD  aspirin EC 81 MG tablet Take 81 mg by mouth daily.   Yes [provider]  cetirizine (ZYRTEC) 10 MG tablet TAKE 1/2 TABLET(5 MG) BY MOUTH AT BEDTIME 06/25/17  Yes [name], NP  cholecalciferol (VITAMIN D) 1000 units tablet Take 1,000 Units by mouth daily.   Yes [provider]  FLUoxetine (PROZAC) 10 MG capsule TAKE 1 CAPSULE(10 MG) BY MOUTH DAILY 01/30/17  Yes [name], NP  hydrALAZINE (APRESOLINE) 25 MG tablet Take 1 tablet (25 mg total) by mouth 3 (three) times daily. Patient taking differently: Take 25 mg by mouth 2 (two) times daily.  05/30/17  Yes Wieting, Richard, MD  ipratropium-albuterol (DUONEB) 0.5-2.5 (3) MG/3ML SOLN Take 3 mLs by nebulization every 6 (six) hours as needed. Patient taking differently: Take 3 mLs by nebulization every 6 (six) hours as needed (shortness of breath and/or wheezing).  11/29/15  Yes [name], MD  SPIRIVA HANDIHALER 18 MCG inhalation capsule INHALE CONTENTS OF 1 CAPSULE ONCE DAILY USING HANDIHALER 06/25/17  Yes [name], NP  acetaminophen (TYLENOL) 650 MG CR tablet Take 650-1,300 mg by mouth every 8 (eight) hours as needed for pain.     [provider]  amoxicillin-clavulanate (AUGMENTIN) 875-125 MG tablet Take 1 tablet by mouth every 12 (twelve) hours. Patient not taking: Reported on 06/28/2017 06/16/17   Nicholes Mango, MD  guaiFENesin (ROBITUSSIN) 100 MG/5ML SOLN Take 5 mLs (100 mg total) by mouth every 4 (four) hours as needed for cough or to loosen phlegm. Patient not taking: Reported on 06/12/2017 05/21/17   Demetrios Loll, MD  omeprazole (PRILOSEC OTC) 20 MG tablet Take 20 mg by mouth daily as needed (Heartburn).      [provider]  predniSONE (STERAPRED UNI-PAK 21 TAB) 10 MG (21) TBPK tablet Take 1 tablet (10 mg total) by mouth daily. Take 6 tablets by mouth for 1 day followed by  5 tablets by mouth for 1 day followed by  4 tablets by mouth for 1 day followed by  3 tablets by mouth for 1 day followed by  2 tablets by mouth for 1 day followed by  1 tablet by mouth for a day and stop Patient not taking: Reported on 06/28/2017 06/16/17   Nicholes Mango, MD  senna-docusate (SENOKOT-S) 8.6-50 MG tablet Take 1 tablet by mouth at bedtime as needed for mild constipation. Patient not taking: Reported on 06/18/2017 06/16/17   Nicholes Mango, MD     ROS: History obtained from the patient, husband  General ROS: negative for - chills, fatigue, fever, night sweats, weight gain or weight loss Psychological ROS: negative for - behavioral disorder, hallucinations, memory difficulties, mood swings or suicidal ideation Ophthalmic ROS: negative for - blurry vision, double vision, eye pain or loss of vision ENT ROS: nasal discharge Allergy and Immunology ROS: negative for - hives or itchy/watery eyes Hematological and Lymphatic ROS: negative for - bleeding problems, bruising or swollen lymph nodes Endocrine ROS: negative for - galactorrhea, hair pattern changes, polydipsia/polyuria or temperature intolerance Respiratory ROS: negative for - cough, hemoptysis, shortness of breath or wheezing Cardiovascular ROS: negative for - chest pain, dyspnea on exertion, edema or irregular heartbeat Gastrointestinal ROS: negative for - abdominal pain, diarrhea, hematemesis, nausea/vomiting or stool incontinence Genito-Urinary ROS: negative for - dysuria, hematuria, incontinence or urinary frequency/urgency Musculoskeletal ROS: right knee pain Neurological ROS: as noted in HPI Dermatological ROS: negative for rash and skin lesion changes  Physical Examination: Blood pressure (!) 141/117, pulse (!) 103, temperature 98.4 F (36.9 C),  resp. rate (!) 23, height 5\' 1"  (1.549 m), weight 112.9 kg (249 lb), SpO2 100 %.  HEENT-  Normocephalic, no lesions, without obvious abnormality.  Normal external eye and conjunctiva.  Normal TM's bilaterally.  Normal auditory canals and external ears. Normal external nose, mucus membranes and septum.  Normal pharynx. Cardiovascular- S1, S2 normal, pulses palpable throughout   Lungs- chest clear, no wheezing, rales, normal symmetric air entry Abdomen- soft, non-tender; bowel sounds normal; no masses,  no organomegaly Extremities- no edema Lymph-no adenopathy palpable Musculoskeletal-right knee pain to palpation Skin-warm and dry, no hyperpigmentation, vitiligo, or suspicious lesions  Neurological Examination   Mental Status: Alert.  Able to follow commands.  Yells out unintelligibly but able to follow commands.   Cranial Nerves: II: Discs flat bilaterally; Visual fields grossly normal, pupils equal, round, reactive to light and accommodation III,IV, VI: ptosis not present, extra-ocular motions intact bilaterally V,VII: smile symmetric, facial light touch sensation decreased on the left VIII: hearing normal bilaterally IX,X: gag reflex present  XI: bilateral shoulder shrug XII: midline tongue extension Motor: Right : Upper extremity   5/5    Left:     Upper extremity   5/5  Lower extremity   5/5     Lower extremity   3+/5 Tone and bulk:normal tone throughout; no atrophy noted Sensory: Pinprick and light touch intact throughout, bilaterally Deep Tendon Reflexes: 2+ and symmetric throughout Plantars: Right: downgoing   Left: downgoing Cerebellar: Normal finger-to-nose testing bilaterally.  Unable to perform heel to shin due to weakness Gait: not tested due to safety concerns    Laboratory Studies:  Basic Metabolic Panel: Recent Labs  Lab 06/28/17 1151  NA 143  K 5.1  CL 108  CO2 28  GLUCOSE 121*  BUN 20  CREATININE 1.73*  CALCIUM 9.3    Liver Function Tests: Recent Labs   Lab 06/28/17 1151  AST 22  ALT 17  ALKPHOS 64  BILITOT 0.8  PROT 6.7  ALBUMIN 3.4*   No results for input(s): LIPASE, AMYLASE in the last 168 hours. No results for input(s): AMMONIA in the last 168 hours.  CBC: Recent Labs  Lab 06/28/17 1226  WBC 5.4  NEUTROABS 3.3  HGB 12.6  HCT 36.8  MCV 95.1  PLT 181    Cardiac Enzymes: Recent Labs  Lab 06/28/17 1151  TROPONINI <0.03    BNP: Invalid input(s): POCBNP  CBG: Recent Labs  Lab 06/28/17 1150  GLUCAP 106*    Microbiology: Results for orders placed or performed during the hospital encounter of 06/12/17  MRSA PCR Screening     Status: None   Collection Time: 06/12/17  8:59 AM  Result Value Ref Range Status   MRSA by PCR NEGATIVE NEGATIVE Final    Comment:        The GeneXpert MRSA Assay (FDA approved for NASAL specimens only), is one component of a comprehensive MRSA colonization surveillance program. It is not intended to diagnose MRSA infection nor to guide or monitor treatment for MRSA infections. Performed at [facility], [address] [place], [place] [address]   Culture, expectorated sputum-assessment     Status: None   Collection Time: 06/12/17 11:58 PM  Result Value Ref Range Status   Specimen Description SPUTUM  Final   Special Requests NONE  Final   Sputum evaluation   Final    THIS SPECIMEN IS ACCEPTABLE FOR SPUTUM CULTURE Performed at [facility], [address] [place], [place] [address]    Report Status 06/13/2017 FINAL  Final  Culture, respiratory (NON-Expectorated)     Status: None   Collection Time: 06/12/17 11:58 PM  Result Value Ref Range Status   Specimen Description   Final    SPUTUM Performed at [facility], [address], [place], [place] [address]    Special Requests   Final    NONE Reflexed from [id number] Performed at [facility], [address] [place], [place] [address]    Gram Stain   Final    RARE WBC PRESENT,  PREDOMINANTLY PMN FEW YEAST RARE GRAM POSITIVE RODS RARE GRAM POSITIVE COCCI Performed at [facility], [place] [address], [place], [place] [address]    Culture FEW CANDIDA TROPICALIS  Final   Report Status 06/15/2017 FINAL  Final    Coagulation Studies: No results for input(s): LABPROT, INR in the last 72 hours.  Urinalysis: No results for input(s): COLORURINE, LABSPEC, PHURINE, GLUCOSEU, HGBUR, BILIRUBINUR, KETONESUR, PROTEINUR, UROBILINOGEN, NITRITE, LEUKOCYTESUR in the last 168 hours.  Invalid input(s): APPERANCEUR  Lipid Panel:    Component Value Date/Time   CHOL 157 03/21/2017 0950   CHOL 148 06/15/2016 1046   TRIG 148 03/21/2017 0950   TRIG 109 06/15/2016 1046   HDL 42 03/21/2017 0950   VLDL 22 06/15/2016 1046   LDLCALC 85 03/21/2017 0950    HgbA1C: No results found for: HGBA1C  Urine Drug Screen:  No results found for: LABOPIA, COCAINSCRNUR, LABBENZ, AMPHETMU, THCU, LABBARB   Alcohol Level:  Recent Labs  Lab 06/28/17 [place] <10    Other results: EKG: sinus tachycardia at 104 bpm.  Imaging: Ct Head Code Stroke Wo Contrast  Addendum Date: 06/28/2017   ADDENDUM REPORT: 06/28/2017 12:27 ADDENDUM: Study discussed by telephone with Dr. Lenise Arena on 06/28/2017 [id number] hours. Electronically Signed   By: Genevie Ann M.D.   On: 06/28/2017 12:27   Result Date: 06/28/2017 CLINICAL DATA:  Code stroke. 77 year old female with abnormal speech beginning 4 hours ago. Expressive aphasia. EXAM: CT HEAD WITHOUT CONTRAST TECHNIQUE: Contiguous axial images were obtained from the base of the skull through the vertex without intravenous contrast. COMPARISON:  Head CT without contrast 07/25/2008. FINDINGS: Brain: Minimal heterogeneity at the right caudate nucleus is stable since 2010 and probably a perivascular space, but there is new mild white matter hypodensity at the anterior limb of the right internal capsule since 2010. No midline shift, ventriculomegaly, mass effect,  evidence of mass lesion, intracranial hemorrhage or evidence of cortically based acute infarction. No cortical encephalomalacia identified. Vascular: No suspicious intracranial vascular hyperdensity. Skull: No acute osseous abnormality identified. Sinuses/Orbits: Stable and well pneumatized. Bilateral nasal cavity mucosal thickening and small volume retained secretions today. Mild right mastoid effusion. Other: Disconjugate gaze similar to the prior CT. No acute orbit or scalp soft tissue findings. ASPECTS [facility] Stroke Program Early CT Score) - Ganglionic level infarction (caudate, lentiform nuclei, internal capsule, insula, M1- M3 cortex): 7 - Supraganglionic infarction (M4-M6 cortex): 3 Total score (0-10 with 10 being normal): 10 IMPRESSION: 1. No acute cortically based infarct or acute intracranial hemorrhage identified. 2. ASPECTS is 10. 3. Mild for age cerebral white matter disease has progressed since 2010. Electronically Signed: By: Genevie Ann M.D. On: 06/28/2017 12:09    Assessment: 77 y.o. female with multiple medical problems presenting with difficulty with speech and left hemiparesis.  Etiology unclear.  Can not rule out stroke/TIA.  Symptoms improving.  Head CT reviewed and shows no acute changes.  Further work up recommended.  Patient on ASA daily at home.    Stroke Risk Factors - hypertension  Plan: 1. HgbA1c, fasting lipid panel 2. MRI, MRA  of the brain without contrast 3. PT consult, OT consult, Speech consult 4. Echocardiogram 5. Carotid dopplers 6. Prophylactic therapy-Antiplatelet med: Aspirin - dose 300mg  rectally 7. NPO until RN stroke swallow screen completed successfully or speech therapy evaluation.   8. Telemetry monitoring 9. Frequent neuro checks  Case discussed with Dr. Jimmye Norman.    Alexis Goodell, MD Neurology [phone] 06/28/2017, 12:44 PM

## 2017-06-28 NOTE — Code Documentation (Signed)
Pt arrives via EMS with complaints of "garbled speech." per EMS LKW was 0700, Code stroke activated upon arrival, husband arrived at bedside and clarified LKW to 5/15 2100, states when pt woke up at 0700 she had garbled speech. Pt taken to CT for non con head CT and then back to room 10, NIHSS 8, pt demonstrates aphasia, dysarthia, left leg weakness and numbness on the left side, no tPA due to LKW > 4.5 hrs, report off to [employer]

## 2017-06-28 NOTE — Evaluation (Signed)
Clinical/Bedside Swallow Evaluation Patient Details  Name: Katherine [name] MRN: [id number] Date of Birth: 14-Jun-1940  Today's Date: 06/28/2017 Time: SLP Start Time (ACUTE ONLY): 0932 SLP Stop Time (ACUTE ONLY): 1820 SLP Time Calculation (min) (ACUTE ONLY): 60 min  Past Medical History:  Past Medical History:  Diagnosis Date  . Angioedema   . Anxiety   . Anxiety and depression   . Chronic constipation   . Chronic kidney disease    stage 3  . COPD (chronic obstructive pulmonary disease) ([place])   . Gallstones   . GERD (gastroesophageal reflux disease)   . Hypertension   . Left ventricular hypertrophy   . Osteoarthritis   . Prurigo nodularis   . Tobacco abuse   . Vitamin D deficiency disease    Past Surgical History:  Past Surgical History:  Procedure Laterality Date  . CHOLECYSTECTOMY    . 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   Procedure: TOTAL KNEE ARTHROPLASTY;  Surgeon: Hessie Knows, MD;  Location: [facility] ORS;  Service: Orthopedics;  Laterality: Right;   HPI:  Pt is a 77 y.o. female with a history of angioedema, anxiety, GERD, chronic kidney disease, COPD, GERD, LVH, hypertension - she has had multiple admissions to the hospital in past 3 months w/ regard to COPD. She presents to the ED today for garbled speech.  According to EMS symptoms started around 4 hours ago where she could not speak intelligently.  She has not had this happen before.  She denies any other complaints or recent illness.  Patient answered questions by nodding or shaking her head this morning; followed commands w/ staff/MD. Currently, pt's breathing and verbalizations are strained (mild+) and there is initiation Dysfluency but pt is conversing at conversational level and ansering questions appropriately. She dialed a number on the phone at the end of the session and spoke w/ them.    Assessment / Plan / Recommendation Clinical Impression  Pt appears to present w/  grossly adequate oropharyngeal phase swallow function; at her baseline, she has COPD which can impact swallowing/breathing timing AND she is impacted by her lacking Dentition for effective mastication/chewing(she does not wear dentures). Pt consumed trials of thin liquids via Cup/Straw then purees and solid trials w/ no overt s/s of aspiration; no decline in vocal quality or respiratory presentation during/post trials. During the Oral phase, pt tended to need min increased time for full mastication to effectively chew/mash the solid boluses for A-P transfer/swallow. Given increased time, pt was able to accomplish swallowing/clearing of solid bolus trials. Pt appeared to benefit from taking a small sip of liquid with or between the trials and taking rest breaks as well d/t min increased WOB w/ any exertion of the task. This is important: pt must NOT get SOB during oral intake or the risk for choking/aspiration will increase. Pt fed self but given tray setup and positioning fully upright. Recommend a Dysphagia level 3 (mech soft w/ chopped meats d/t edentulous status) w/ Thin liquids. Moisten foods well. General aspiration precautions, Reflux precautions d/t GERD dx. Recommend Pills w/ Puree for safer swallowing. Reduce distractions during meals; Rest Breaks during meals.  SLP Visit Diagnosis: Dysphagia, unspecified (R13.10)    Aspiration Risk  (reduced following general precautions)    Diet Recommendation  Dysphagia level 3 (mech soft w/ Gravy to moisten); Thin liquids. General aspiration precautions; Rest Breaks to avoid SOB from exertion during meals.   Medication Administration: Whole meds with puree(or in liquid form  for easier swallowing as needed)    Other  Recommendations Recommended Consults: (Dietician f/u) Oral Care Recommendations: Oral care BID;Patient independent with oral care Other Recommendations: (n/a)   Follow up Recommendations None      Frequency and Duration (n/a)  (n/a)        Prognosis Prognosis for Safe Diet Advancement: Good Barriers to Reach Goals: (COPD baseline)      Swallow Study   General Date of Onset: 06/28/17 HPI: Pt is a 77 y.o. female with a history of angioedema, anxiety, GERD, chronic kidney disease, COPD, GERD, LVH, hypertension - she has had multiple admissions to the hospital in past 3 months w/ regard to COPD. She presents to the ED today for garbled speech.  According to EMS symptoms started around 4 hours ago where she could not speak intelligently.  She has not had this happen before.  She denies any other complaints or recent illness.  Patient answered questions by nodding or shaking her head this morning; followed commands w/ staff/MD. Currently, pt's breathing and verbalizations are strained (mild+) and there is initiation Dysfluency but pt is conversing at conversational level and ansering questions appropriately. She dialed a number on the phone at the end of the session and spoke w/ them.  Type of Study: Bedside Swallow Evaluation Previous Swallow Assessment: none Diet Prior to this Study: NPO(currently, but regular diet at home until last night) Temperature Spikes Noted: No(wbc 5.4) Respiratory Status: Nasal cannula(1-2 liters) History of Recent Intubation: No Behavior/Cognition: Alert;Cooperative;Pleasant mood Oral Cavity Assessment: Within Functional Limits;Dry Oral Care Completed by SLP: Recent completion by staff Oral Cavity - Dentition: Edentulous(does not wear dentures) Vision: Functional for self-feeding Self-Feeding Abilities: Able to feed self;Needs assist;Needs set up Patient Positioning: Upright in bed(needed positioning upright) Baseline Vocal Quality: (min strained) Volitional Cough: Strong Volitional Swallow: Able to elicit    Oral/Motor/Sensory Function Overall Oral Motor/Sensory Function: Within functional limits   Ice Chips Ice chips: Within functional limits Presentation: Spoon(fed; 2 trials)   Thin Liquid Thin  Liquid: Within functional limits Presentation: Cup;Self Fed;Straw(5 trials via each cup, straw)    Nectar Thick Nectar Thick Liquid: Not tested   Honey Thick Honey Thick Liquid: Not tested   Puree Puree: Within functional limits Presentation: Self Fed;Spoon(5 trials)   Solid   GO   Solid: Within functional limits(mech soft foods) Presentation: Self Fed(5 trials) Other Comments: is edentulous       Orinda Kenner, MS, CCC-SLP Damarys Speir 06/28/2017,6:24 PM

## 2017-06-28 NOTE — ED Provider Notes (Signed)
[facility] Emergency Department Provider Note       Time seen: ----------------------------------------- 11:44 AM on 06/28/2017 -----------------------------------------   I have reviewed the triage vital signs and the nursing notes.  HISTORY   Chief Complaint Dysphagia    HPI Katherine [name] is a 77 y.o. female with a history of angioedema, anxiety, chronic kidney disease, COPD, GERD, LVH, hypertension who presents to the ED for garbled speech.  According to EMS symptoms started around 4 hours ago where she could not speak intelligently.  She has not had this happen before.  She denies any other complaints or recent illness.  Patient has answer questions by nodding or shaking her head.  Past Medical History:  Diagnosis Date  . Angioedema   . Anxiety   . Anxiety and depression   . Chronic constipation   . Chronic kidney disease    stage 3  . COPD (chronic obstructive pulmonary disease) ([place])   . Gallstones   . GERD (gastroesophageal reflux disease)   . Hypertension   . Left ventricular hypertrophy   . Osteoarthritis   . Prurigo nodularis   . Tobacco abuse   . Vitamin D deficiency disease     Patient Active Problem List   Diagnosis Date Noted  . Acute on chronic respiratory failure with hypoxia ([place]) 06/12/2017  . COPD exacerbation ([place]) 04/20/2017  . COPD with exacerbation ([place]) 04/05/2017  . Hand pain 03/21/2017  . Dry skin 03/21/2017  . Pain in finger of left hand 09/12/2016  . Chronic fatigue 06/12/2016  . Left knee pain 06/12/2016  . Advanced care planning/counseling discussion 03/13/2016  . DNR (do not resuscitate) 03/13/2016  . Primary localized osteoarthritis of right knee 02/08/2016  . OSA and COPD overlap syndrome ([place]) 09/16/2015  . Rotator cuff syndrome 09/07/2015  . 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 Sleep disturbance 04/14/2015  . Coronary artery disease 03/14/2015  . 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 Lichen simplex  chronicus 08/12/2014  . Anxiety   . Tobacco abuse   . Prurigo nodularis   . Hypertensive heart disease   . GERD (gastroesophageal reflux disease)   . COPD (chronic obstructive pulmonary disease) ([place])   . Osteoarthritis   . Vitamin D deficiency disease   . Chronic constipation   . CKD (chronic kidney disease), stage III ([place])   . LVH (left ventricular hypertrophy) 05/20/2013  . Essential hypertension 05/20/2013  . Morbid obesity ([place]) 05/20/2013    Past Surgical History:  Procedure Laterality Date  . CHOLECYSTECTOMY    . 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   Procedure: TOTAL KNEE ARTHROPLASTY;  Surgeon: Hessie Knows, MD;  Location: [facility] ORS;  Service: Orthopedics;  Laterality: Right;    Allergies Bee venom; Enalapril maleate; and Other  Social History Social History   Tobacco Use  . Smoking status: Former Smoker    Packs/day: 0.50    Years: 60.00    Pack years: 30.00    Types: Cigarettes  . Smokeless tobacco: Never Used  Substance Use Topics  . Alcohol use: No    Alcohol/week: 0.0 oz    Comment: rare  . Drug use: No   Review of Systems Constitutional: Negative for fever. Eyes: Negative for vision changes ENT:  Negative for congestion, sore throat Cardiovascular: Negative for chest pain. Respiratory: Negative for shortness of breath. Gastrointestinal: Negative for abdominal pain, vomiting and diarrhea. Musculoskeletal: Negative for back pain. Skin: Negative for rash. Neurological: Positive for  speech disturbance, negative for weakness or numbness  All systems negative/normal/unremarkable except as stated in the HPI  ____________________________________________   PHYSICAL EXAM:  VITAL SIGNS: ED Triage Vitals  Enc Vitals Group     BP      Pulse      Resp      Temp      Temp src      SpO2      Weight      Height      Head Circumference      Peak Flow      Pain Score      Pain Loc      Pain Edu?       Excl. in [place]?    Constitutional: Alert, mild distress Eyes: Conjunctivae are normal. Normal extraocular movements. ENT   Head: Normocephalic and atraumatic.   Nose: No congestion/rhinnorhea.   Mouth/Throat: Mucous membranes are moist.   Neck: No stridor. Cardiovascular: Normal rate, regular rhythm. No murmurs, rubs, or gallops. Respiratory: Normal respiratory effort without tachypnea nor retractions. Breath sounds are clear and equal bilaterally. No wheezes/rales/rhonchi. Gastrointestinal: Soft and nontender. Normal bowel sounds Musculoskeletal: Nontender with normal range of motion in extremities. No lower extremity tenderness nor edema. Neurologic: Garbled speech, no other gross focal neurologic deficits are appreciated.  Skin:  Skin is warm, dry and intact. No rash noted. Psychiatric: Anxious mood and affect, garbled speech is noted ____________________________________________  EKG: Interpreted by me.  Sinus tachycardia with a rate of 104 bpm, left axis deviation, normal QT.  ____________________________________________  ED COURSE:  As part of my medical decision making, I reviewed the following data within the [place] History obtained from family if available, nursing notes, old chart and ekg, as well as notes from prior ED visits. Patient presented for speech disturbance, we will assess with labs and imaging as indicated at this time.   Procedures ____________________________________________   LABS (pertinent positives/negatives)  Labs Reviewed  ETHANOL  COMPREHENSIVE METABOLIC PANEL  TROPONIN I  URINALYSIS, ROUTINE W REFLEX MICROSCOPIC  URINE DRUG SCREEN, QUALITATIVE ([facility] ONLY)  COMPREHENSIVE METABOLIC PANEL  CBC WITH DIFFERENTIAL/PLATELET    RADIOLOGY  CT head Does not reveal any acute process ____________________________________________  DIFFERENTIAL DIAGNOSIS   CVA, TIA, angioedema, anxiety  FINAL ASSESSMENT AND  PLAN  Expressive aphasia   Plan: The patient had presented for speech disturbance. Patient's labs are still pending at this time. Patient's imaging regarding her initial CT head was unremarkable.  She is outside of any specific TPA window as she was last known well last night.  She still has expressive difficulty but no other focal neurologic complaints.  She has been evaluated by neurology.  I will discuss with the hospitalist for admission.   Laurence Aly, MD   Note: This note was generated in part or whole with voice recognition software. Voice recognition is usually quite accurate but there are transcription errors that can and very often do occur. I apologize for any typographical errors that were not detected and corrected.     Earleen Newport, MD 06/28/17 1224

## 2017-06-28 NOTE — Progress Notes (Signed)
Katherine [name] arrived as patient being transported for testing. [name] sat with patient's family. Patient's family stated that he is concerned about her resent visits to the hospital but his pressing concern is to find out why the patient's nose was bleeding. He would like someone to discuss this with him. [place] provided ministry of presence, attentive listening, and shared information with staff.

## 2017-06-28 NOTE — ED Notes (Signed)
Patient transported to CT 

## 2017-06-28 NOTE — ED Triage Notes (Signed)
Pt comes via [facility] from home with c/o abnormal speech that started about 4 hours ago. EMS reports vitals- Bp 143/92, HR-99, BS 132, O2-95 on 4L nasal cannula.  Pt normally wears 2L at home. Pt has expressive aphasia. MD at bedside. Pt able to follow MD's commands. Pt is alert and cooperative.

## 2017-06-28 NOTE — ED Notes (Signed)
Pt repositioned in bed and warm blanket and pillow given

## 2017-06-28 NOTE — ED Notes (Signed)
Attempted to call report. Per Secretary Ginger they need to assign patient so please allow about 5 minutes then call back.

## 2017-06-28 NOTE — Patient Outreach (Signed)
[name]  06/28/2017  Katherine [name] March 16, 1940 [id number]  Pt discussed in [facility] difficult case discussion this am, 3 admits in 30 days- 4/3- 4/8 for COPD, HTN crisis; 4/13-4/17 for COPD; 4/30- 5/4 for acute on  Chronic respiratory failure with hypoxia.  Pt receiving HH RN, PT.  COPD  Action plan provided at last home visit, reviewed with pt and spouse. View  In EMR- [place] reports pt has  COPD kit in pt's home.  RN CM following for chronic case management, next home visit 5/24.    Addendum:  View in EMR pt admitted from ED today for Aphasia.    Plan:  Notify [facility] hospital liaison of pt's hospital admission, RN CM  To follow up at discharge.    [name].   [facility] Care Management  [phone]

## 2017-06-28 NOTE — Progress Notes (Signed)
CODE STROKE- PHARMACY COMMUNICATION   Time CODE STROKE called/page received:1145  Time response to CODE STROKE was made (in person or via phone): 1153  Time Stroke Kit retrieved from [place] (only if needed):not a candidate for TPA  Name of Provider/Nurse contacted:Samantha  Past Medical History:  Diagnosis Date  . Angioedema   . Anxiety   . Anxiety and depression   . Chronic constipation   . Chronic kidney disease    stage 3  . COPD (chronic obstructive pulmonary disease) ([place])   . Gallstones   . GERD (gastroesophageal reflux disease)   . Hypertension   . Left ventricular hypertrophy   . Osteoarthritis   . Prurigo nodularis   . Tobacco abuse   . Vitamin D deficiency disease    Prior to Admission medications   Medication Sig Start Date End Date Taking? Authorizing Provider  albuterol (PROVENTIL HFA) 108 (90 Base) MCG/ACT inhaler Inhale 2 puffs into the lungs every 4 (four) hours as needed for wheezing or shortness of breath. 05/30/17  Yes Wieting, Richard, MD  aspirin EC 81 MG tablet Take 81 mg by mouth daily.   Yes [provider]  cetirizine (ZYRTEC) 10 MG tablet TAKE 1/2 TABLET(5 MG) BY MOUTH AT BEDTIME 06/25/17  Yes [name], NP  cholecalciferol (VITAMIN D) 1000 units tablet Take 1,000 Units by mouth daily.   Yes [provider]  FLUoxetine (PROZAC) 10 MG capsule TAKE 1 CAPSULE(10 MG) BY MOUTH DAILY 01/30/17  Yes [name], NP  hydrALAZINE (APRESOLINE) 25 MG tablet Take 1 tablet (25 mg total) by mouth 3 (three) times daily. Patient taking differently: Take 25 mg by mouth 2 (two) times daily.  05/30/17  Yes Wieting, Richard, MD  ipratropium-albuterol (DUONEB) 0.5-2.5 (3) MG/3ML SOLN Take 3 mLs by nebulization every 6 (six) hours as needed. Patient taking differently: Take 3 mLs by nebulization every 6 (six) hours as needed (shortness of breath and/or wheezing).  11/29/15  Yes [name], MD  SPIRIVA HANDIHALER 18 MCG inhalation capsule INHALE  CONTENTS OF 1 CAPSULE ONCE DAILY USING HANDIHALER 06/25/17  Yes [name], NP  acetaminophen (TYLENOL) 650 MG CR tablet Take 650-1,300 mg by mouth every 8 (eight) hours as needed for pain.     [provider]  amoxicillin-clavulanate (AUGMENTIN) 875-125 MG tablet Take 1 tablet by mouth every 12 (twelve) hours. Patient not taking: Reported on 06/28/2017 06/16/17   Nicholes Mango, MD  guaiFENesin (ROBITUSSIN) 100 MG/5ML SOLN Take 5 mLs (100 mg total) by mouth every 4 (four) hours as needed for cough or to loosen phlegm. Patient not taking: Reported on 06/12/2017 05/21/17   Demetrios Loll, MD  omeprazole (PRILOSEC OTC) 20 MG tablet Take 20 mg by mouth daily as needed (Heartburn).     [provider]  predniSONE (STERAPRED UNI-PAK 21 TAB) 10 MG (21) TBPK tablet Take 1 tablet (10 mg total) by mouth daily. Take 6 tablets by mouth for 1 day followed by  5 tablets by mouth for 1 day followed by  4 tablets by mouth for 1 day followed by  3 tablets by mouth for 1 day followed by  2 tablets by mouth for 1 day followed by  1 tablet by mouth for a day and stop Patient not taking: Reported on 06/28/2017 06/16/17   Nicholes Mango, MD  senna-docusate (SENOKOT-S) 8.6-50 MG tablet Take 1 tablet by mouth at bedtime as needed for mild constipation. Patient not taking: Reported on 06/18/2017 06/16/17   Nicholes Mango, MD  Ulice Dash D ,PharmD Clinical Pharmacist  06/28/2017  1:10 PM

## 2017-06-28 NOTE — ED Notes (Signed)
Pt back from CT. MD Reynolds at bedside performing neuro exam.

## 2017-06-28 NOTE — Progress Notes (Signed)
Family Meeting Note  Advance Directive:yes  Today a meeting took place with the Patient, patient's husband.  Patient is unable to participate due [id number] capacity aphasia   The following clinical team members were present during this meeting:MD  The following were discussed:Patient's diagnosis: Aphasia, chronic respiratory failure, chronic kidney disease, COPD, hypertension, morbid obesity, Patient's progosis: Unable to determine and Goals for treatment: Full Code  Additional follow-up to be provided: prn  Time spent during discussion:20 minutes  Gorden Harms, MD

## 2017-06-28 NOTE — ED Notes (Signed)
Husband at bedside. Husband reports that pt was normal yesterday and that this morning around 7 am that she began having trouble speaking. Husband called home health nurse and she stated if pt didn't get better to call EMS. Husband reports he called EMS. Husband also states that he helped pt to restroom because she was a little wobbly.

## 2017-06-28 NOTE — H&P (Signed)
[facility] [place] NAME: Katherine [name]    MR#:  [id number]  DATE OF BIRTH:  March 23, 1940  DATE OF ADMISSION:  06/28/2017  PRIMARY CARE PHYSICIAN: [name], NP   REQUESTING/REFERRING PHYSICIAN:   CHIEF COMPLAINT:   Chief Complaint  Patient presents with  . Dysphagia    HISTORY OF PRESENT ILLNESS: Katherine [name]  is a 77 y.o. female with a known history per below presenting with slurred speech, altered mental status at 7 AM this morning, brought to the emergency room via EMS for further evaluation, in the emergency room patient was found to have potassium 5.1, creatinine 1.7, CT head negative, patient found to be aphasic without motor deficits, patient assessed by neurology, recommended admission, patient evaluated in the emergency room, husband at the bedside, patient in no distress, resting comfortably in bed, patient poor historian due to and will be admitted for acute expressive aphasia.  PAST MEDICAL HISTORY:   Past Medical History:  Diagnosis Date  . Angioedema   . Anxiety   . Anxiety and depression   . Chronic constipation   . Chronic kidney disease    stage 3  . COPD (chronic obstructive pulmonary disease) ([place])   . Gallstones   . GERD (gastroesophageal reflux disease)   . Hypertension   . Left ventricular hypertrophy   . Osteoarthritis   . Prurigo nodularis   . Tobacco abuse   . Vitamin D deficiency disease     PAST SURGICAL HISTORY:  Past Surgical History:  Procedure Laterality Date  . CHOLECYSTECTOMY    . 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   Procedure: TOTAL KNEE ARTHROPLASTY;  Surgeon: Hessie Knows, MD;  Location: [facility] ORS;  Service: Orthopedics;  Laterality: Right;    SOCIAL HISTORY:  Social History   Tobacco Use  . Smoking status: Former Smoker    Packs/day: 0.50    Years: 60.00    Pack years: 30.00    Types: Cigarettes  . Smokeless tobacco: Never Used   Substance Use Topics  . Alcohol use: No    Alcohol/week: 0.0 oz    Comment: rare    FAMILY HISTORY:  Family History  Problem Relation Age of Onset  . Alcohol abuse Mother   . Sickle cell anemia Daughter   . Hypertension Son   . Cancer Neg Hx   . COPD Neg Hx   . Diabetes Neg Hx   . Heart disease Neg Hx   . Stroke Neg Hx     DRUG ALLERGIES:  Allergies  Allergen Reactions  . Bee Venom Swelling  . Enalapril Maleate Swelling  . Other     Bee stings    REVIEW OF SYSTEMS:  Poor historian due to aphasia, husband at the bedside CONSTITUTIONAL: No fever, fatigue or weakness.  EYES: No blurred or double vision.  EARS, NOSE, AND THROAT: No tinnitus or ear pain.  RESPIRATORY: No cough, shortness of breath, wheezing or hemoptysis.  CARDIOVASCULAR: No chest pain, orthopnea, edema.  GASTROINTESTINAL: No nausea, vomiting, diarrhea or abdominal pain.  GENITOURINARY: No dysuria, hematuria.  ENDOCRINE: No polyuria, nocturia,  HEMATOLOGY: No anemia, easy bruising or bleeding SKIN: No rash or lesion. MUSCULOSKELETAL: No joint pain or arthritis.   NEUROLOGIC: Slurred speech  PSYCHIATRY: No anxiety or depression.   MEDICATIONS AT HOME:  Prior to Admission medications   Medication Sig Start Date End Date Taking? Authorizing Provider  albuterol (PROVENTIL HFA) 108 (90  Base) MCG/ACT inhaler Inhale 2 puffs into the lungs every 4 (four) hours as needed for wheezing or shortness of breath. 05/30/17  Yes Wieting, Richard, MD  aspirin EC 81 MG tablet Take 81 mg by mouth daily.   Yes [provider]  cetirizine (ZYRTEC) 10 MG tablet TAKE 1/2 TABLET(5 MG) BY MOUTH AT BEDTIME 06/25/17  Yes [name], NP  cholecalciferol (VITAMIN D) 1000 units tablet Take 1,000 Units by mouth daily.   Yes [provider]  FLUoxetine (PROZAC) 10 MG capsule TAKE 1 CAPSULE(10 MG) BY MOUTH DAILY 01/30/17  Yes [name], NP  hydrALAZINE (APRESOLINE) 25 MG tablet Take 1 tablet (25 mg total) by  mouth 3 (three) times daily. Patient taking differently: Take 25 mg by mouth 2 (two) times daily.  05/30/17  Yes Wieting, Richard, MD  ipratropium-albuterol (DUONEB) 0.5-2.5 (3) MG/3ML SOLN Take 3 mLs by nebulization every 6 (six) hours as needed. Patient taking differently: Take 3 mLs by nebulization every 6 (six) hours as needed (shortness of breath and/or wheezing).  11/29/15  Yes [name], MD  SPIRIVA HANDIHALER 18 MCG inhalation capsule INHALE CONTENTS OF 1 CAPSULE ONCE DAILY USING HANDIHALER 06/25/17  Yes [name], NP  acetaminophen (TYLENOL) 650 MG CR tablet Take 650-1,300 mg by mouth every 8 (eight) hours as needed for pain.     [provider]  omeprazole (PRILOSEC OTC) 20 MG tablet Take 20 mg by mouth daily as needed (Heartburn).     [provider]      PHYSICAL EXAMINATION:   VITAL SIGNS: Blood pressure (!) 141/83, pulse 72, temperature 98.4 F (36.9 C), resp. rate (!) 26, height 5\' 1"  (1.549 m), weight 112.9 kg (249 lb), SpO2 100 %.  GENERAL:  77 y.o.-year-old patient lying in the bed with no acute distress.  Extreme morbid obesity EYES: Pupils equal, round, reactive to light and accommodation. No scleral icterus. Extraocular muscles intact.  HEENT: Head atraumatic, normocephalic. Oropharynx and nasopharynx clear.  NECK:  Supple, no jugular venous distention. No thyroid enlargement, no tenderness.  LUNGS: Normal breath sounds bilaterally, no wheezing, rales,rhonchi or crepitation. No use of accessory muscles of respiration.  CARDIOVASCULAR: S1, S2 normal. No murmurs, rubs, or gallops.  ABDOMEN: Soft, nontender, nondistended. Bowel sounds present. No organomegaly or mass.  EXTREMITIES: No pedal edema, cyanosis, or clubbing.  NEUROLOGIC: Cranial nerves II through XII are intact.  Expressive aphasia noted MAES. Gait not checked.  PSYCHIATRIC: The patient is alert, alert, expressive aphasia noted  SKIN: No obvious rash, lesion, or ulcer.   LABORATORY  PANEL:   CBC Recent Labs  Lab 06/28/17 1226  WBC 5.4  HGB 12.6  HCT 36.8  PLT 181  MCV 95.1  MCH 32.5  MCHC 34.2  RDW 15.3*  LYMPHSABS 1.5  MONOABS 0.5  EOSABS 0.0  BASOSABS 0.0   ------------------------------------------------------------------------------------------------------------------  Chemistries  Recent Labs  Lab 06/28/17 1151 06/28/17 1226  NA 143 143  K 5.1 4.8  CL 108 109  CO2 28 27  GLUCOSE 121* 114*  BUN 20 21*  CREATININE 1.73* 1.69*  CALCIUM 9.3 9.2  AST 22 18  ALT 17 17  ALKPHOS 64 67  BILITOT 0.8 0.5   ------------------------------------------------------------------------------------------------------------------ estimated creatinine clearance is 33.5 mL/min (A) (by C-G formula based on SCr of 1.69 mg/dL (H)). ------------------------------------------------------------------------------------------------------------------ No results for input(s): TSH, T4TOTAL, T3FREE, THYROIDAB in the last 72 hours.  Invalid input(s): FREET3   Coagulation profile No results for input(s): INR, PROTIME in the last 168 hours. -------------------------------------------------------------------------------------------------------------------  No results for input(s): DDIMER in the last 72 hours. -------------------------------------------------------------------------------------------------------------------  Cardiac Enzymes Recent Labs  Lab 06/28/17 1151  TROPONINI <0.03   ------------------------------------------------------------------------------------------------------------------ Invalid input(s): POCBNP  ---------------------------------------------------------------------------------------------------------------  Urinalysis    Component Value Date/Time   COLORURINE YELLOW (A) 02/11/2016 0617   APPEARANCEUR CLEAR (A) 02/11/2016 0617   APPEARANCEUR Clear 02/20/2013 1459   LABSPEC 1.012 02/11/2016 0617   LABSPEC 1.012 02/20/2013 1459    PHURINE 6.0 02/11/2016 0617   GLUCOSEU NEGATIVE 02/11/2016 0617   GLUCOSEU Negative 02/20/2013 1459   HGBUR SMALL (A) 02/11/2016 0617   BILIRUBINUR NEGATIVE 02/11/2016 0617   BILIRUBINUR Negative 02/20/2013 1459   KETONESUR NEGATIVE 02/11/2016 0617   PROTEINUR 30 (A) 02/11/2016 0617   NITRITE NEGATIVE 02/11/2016 0617   LEUKOCYTESUR TRACE (A) 02/11/2016 0617   LEUKOCYTESUR Negative 02/20/2013 1459     RADIOLOGY: Ct Head Code Stroke Wo Contrast  Addendum Date: 06/28/2017   ADDENDUM REPORT: 06/28/2017 12:27 ADDENDUM: Study discussed by telephone with Dr. Lenise Arena on 06/28/2017 [id number] hours. Electronically Signed   By: Genevie Ann M.D.   On: 06/28/2017 12:27   Result Date: 06/28/2017 CLINICAL DATA:  Code stroke. 77 year old female with abnormal speech beginning 4 hours ago. Expressive aphasia. EXAM: CT HEAD WITHOUT CONTRAST TECHNIQUE: Contiguous axial images were obtained from the base of the skull through the vertex without intravenous contrast. COMPARISON:  Head CT without contrast 07/25/2008. FINDINGS: Brain: Minimal heterogeneity at the right caudate nucleus is stable since 2010 and probably a perivascular space, but there is new mild white matter hypodensity at the anterior limb of the right internal capsule since 2010. No midline shift, ventriculomegaly, mass effect, evidence of mass lesion, intracranial hemorrhage or evidence of cortically based acute infarction. No cortical encephalomalacia identified. Vascular: No suspicious intracranial vascular hyperdensity. Skull: No acute osseous abnormality identified. Sinuses/Orbits: Stable and well pneumatized. Bilateral nasal cavity mucosal thickening and small volume retained secretions today. Mild right mastoid effusion. Other: Disconjugate gaze similar to the prior CT. No acute orbit or scalp soft tissue findings. ASPECTS [facility] Stroke Program Early CT Score) - Ganglionic level infarction (caudate, lentiform nuclei, internal capsule, insula,  M1- M3 cortex): 7 - Supraganglionic infarction (M4-M6 cortex): 3 Total score (0-10 with 10 being normal): 10 IMPRESSION: 1. No acute cortically based infarct or acute intracranial hemorrhage identified. 2. ASPECTS is 10. 3. Mild for age cerebral white matter disease has progressed since 2010. Electronically Signed: By: Genevie Ann M.D. On: 06/28/2017 12:09    EKG: Orders placed or performed during the hospital encounter of 06/28/17  . ED EKG  . ED EKG  . EKG 12-Lead  . EKG 12-Lead  . EKG 12-Lead  . EKG 12-Lead    IMPRESSION AND PLAN: *Acute expressive aphasia Secondary to CVA versus TIA Admit to regular nursing floor bed on our CVA/TIA protocol, neurology to see, aspiration/fall/skin care precautions, physical therapy/speech therapy to evaluate/treat, check MRI of the brain, carotid Dopplers, echocardiogram, discontinue aspirin, start Plavix, statin therapy-lipids in the morning, and continue close medical monitoring with neuro checks  *Chronic hypoxic respiratory failure/COPD without exacerbation Stable Continue supplemental oxygen at 2 L via nasal cannula continuous-her baseline  *Extreme morbid obesity, chronic Most likely secondary to excess calories Lifestyle modification recommended  *Chronic benign essential hypertension Stable Continue home regiment, allow for permissive hypertension, PRN hydralazine for systolic blood pressure greater than 180, vitals per routine, make changes as per necessary  *Chronic kidney disease stage III Stable, at baseline Avoid nephrotoxic agents   All the records  are reviewed and case discussed with ED provider. Management plans discussed with the patient, family and they are in agreement.  CODE STATUS:full Code Status History    Date Active Date Inactive Code Status Order ID Comments User Context   06/12/2017 0858 06/16/2017 1659 DNR [id number]  Demetrios Loll, MD Inpatient   05/26/2017 1732 05/30/2017 1446 DNR [id number]  Hillary Bow, MD ED    05/17/2017 1228 05/21/2017 1511 DNR [id number]  Asencion Gowda, NP Inpatient   04/21/2017 0117 04/22/2017 1750 DNR [id number]  Lance Coon, MD Inpatient   04/05/2017 1411 04/08/2017 2101 Full Code [id number]  Saundra Shelling, MD Inpatient   02/08/2016 1414 02/11/2016 1729 Full Code [id number]  Hessie Knows, MD Inpatient   11/27/2015 1645 11/29/2015 1628 Full Code [id number]  Idelle Crouch, MD Inpatient   02/10/2015 2122 02/12/2015 1809 Full Code [id number]  Lytle Butte, MD ED   09/16/2014 1220 09/19/2014 1736 Full Code [id number]  Aldean Jewett, MD Inpatient    Questions for Most Recent Historical Code Status (Order [id number])    Question Answer Comment   In the event of cardiac or respiratory ARREST Do not call a "code blue"    In the event of cardiac or respiratory ARREST Do not perform Intubation, CPR, defibrillation or ACLS    In the event of cardiac or respiratory ARREST Use medication by any route, position, wound care, and other measures to relive pain and suffering. May use oxygen, suction and manual treatment of airway obstruction as needed for comfort.    Comments MOST form in chart        TOTAL TIME TAKING CARE OF THIS PATIENT: 45 minutes.    Avel Peace Samvel Zinn M.D on 06/28/2017   Between 7am to 6pm - Pager - [phone]  After 6pm go to [email or web address] - password [email or web address] [facility]  Office  [phone]  CC: Primary care physician; [name], NP   Note: This dictation was prepared with Dragon dictation along with smaller phrase technology. Any transcriptional errors that result from this process are unintentional.

## 2017-06-28 NOTE — Progress Notes (Signed)
*  PRELIMINARY RESULTS* Echocardiogram 2D Echocardiogram has been performed.  Katherine [name] Katherine [name] 06/28/2017, 7:55 PM

## 2017-06-28 NOTE — Progress Notes (Signed)
Failed bedside swallow in ED. Called speech as follow up. Will hold PO meds for now.

## 2017-06-29 ENCOUNTER — Inpatient Hospital Stay (HOSPITAL_BASED_OUTPATIENT_CLINIC_OR_DEPARTMENT_OTHER): Payer: Medicare HMO

## 2017-06-29 DIAGNOSIS — J449 Chronic obstructive pulmonary disease, unspecified: Secondary | ICD-10-CM | POA: Diagnosis not present

## 2017-06-29 DIAGNOSIS — J9610 Chronic respiratory failure, unspecified whether with hypoxia or hypercapnia: Secondary | ICD-10-CM | POA: Diagnosis not present

## 2017-06-29 DIAGNOSIS — G459 Transient cerebral ischemic attack, unspecified: Secondary | ICD-10-CM | POA: Diagnosis not present

## 2017-06-29 DIAGNOSIS — R4701 Aphasia: Secondary | ICD-10-CM | POA: Diagnosis not present

## 2017-06-29 LAB — HEMOGLOBIN A1C
HEMOGLOBIN A1C: 6.5 % — AB (ref 4.8–5.6)
MEAN PLASMA GLUCOSE: 139.85 mg/dL

## 2017-06-29 LAB — LIPID PANEL
CHOLESTEROL: 171 mg/dL (ref 0–200)
HDL: 50 mg/dL (ref >40)
LDL Cholesterol: 103 mg/dL — ABNORMAL HIGH (ref 0–99)
TRIGLYCERIDES: 91 mg/dL (ref <150)
Total CHOL/HDL Ratio: 3.4 RATIO
VLDL: 18 mg/dL (ref 0–40)

## 2017-06-29 MED ORDER — TRAZODONE HCL 50 MG PO TABS
50.0000 mg | ORAL_TABLET | Freq: Every day | ORAL | Status: DC
  Administered 2017-06-29 (×2): 50 mg via ORAL
  Filled 2017-06-29 (×2): qty 1

## 2017-06-29 NOTE — Progress Notes (Signed)
[facility] NAME: Katherine [name]    MR#:  [id number]  DATE OF BIRTH:  12-Apr-1940  SUBJECTIVE: Seen at bedside, patient admitted for slurred speech yesterday and evaluation of stroke.  CHIEF COMPLAINT:   Chief Complaint  Patient presents with  . Dysphagia  decreased slurred speech today. REVIEW OF SYSTEMS:    Review of Systems  Constitutional: Negative for chills and fever.  HENT: Negative for hearing loss.   Eyes: Negative for blurred vision, double vision and photophobia.  Respiratory: Negative for cough, hemoptysis and shortness of breath.   Cardiovascular: Negative for palpitations, orthopnea and leg swelling.  Gastrointestinal: Negative for abdominal pain, diarrhea and vomiting.  Genitourinary: Negative for dysuria and urgency.  Musculoskeletal: Negative for myalgias and neck pain.  Skin: Negative for rash.  Neurological: Positive for speech change. Negative for dizziness, focal weakness, seizures, weakness and headaches.  Psychiatric/Behavioral: Negative for memory loss. The patient is nervous/anxious. The patient does not have insomnia.     Nutrition:  Tolerating Diet: Tolerating PT:      DRUG ALLERGIES:   Allergies  Allergen Reactions  . Bee Venom Swelling  . Enalapril Maleate Swelling  . Other     Bee stings    VITALS:  Blood pressure 112/66, pulse 90, temperature 98.2 F (36.8 C), temperature source Oral, resp. rate 18, height 5\' 1"  (1.549 m), weight 112.9 kg (249 lb), SpO2 100 %.  PHYSICAL EXAMINATION:   Physical Exam  GENERAL:  77 y.o.-year-old patient lying in the bed with no acute distress.  EYES: Pupils equal, round, reactive to light and accommodation. No scleral icterus. Extraocular muscles intact.  HEENT: Head atraumatic, normocephalic. Oropharynx and nasopharynx clear.  NECK:  Supple, no jugular venous distention. No thyroid enlargement, no tenderness.  LUNGS: Normal breath sounds  bilaterally, no wheezing, rales,rhonchi or crepitation. No use of accessory muscles of respiration.  CARDIOVASCULAR: S1, S2 normal. No murmurs, rubs, or gallops.  ABDOMEN: Soft, nontender, nondistended. Bowel sounds present. No organomegaly or mass.  EXTREMITIES: No pedal edema, cyanosis, or clubbing.  NEUROLOGIC: Cranial nerves II through XII are intact. Muscle strength 5/5 in all extremities. Sensation intact. Gait not checked.  PSYCHIATRIC: The patient is alert and oriented x 3.  SKIN: No obvious rash, lesion, or ulcer.    LABORATORY PANEL:   CBC Recent Labs  Lab 06/28/17 1226  WBC 5.4  HGB 12.6  HCT 36.8  PLT 181   ------------------------------------------------------------------------------------------------------------------  Chemistries  Recent Labs  Lab 06/28/17 1226  NA 143  K 4.8  CL 109  CO2 27  GLUCOSE 114*  BUN 21*  CREATININE 1.69*  CALCIUM 9.2  AST 18  ALT 17  ALKPHOS 67  BILITOT 0.5   ------------------------------------------------------------------------------------------------------------------  Cardiac Enzymes Recent Labs  Lab 06/28/17 1151  TROPONINI <0.03   ------------------------------------------------------------------------------------------------------------------  RADIOLOGY:  Mr Brain Wo Contrast  Result Date: 06/28/2017 CLINICAL DATA:  Altered mental status and slurred speech beginning this morning. Aphasic without motor deficits. History of hypertension. EXAM: MRI HEAD WITHOUT CONTRAST MRA HEAD WITHOUT CONTRAST TECHNIQUE: Multiplanar, multiecho pulse sequences of the brain and surrounding structures were obtained without intravenous contrast. Angiographic images of the head were obtained using MRA technique without contrast. COMPARISON:  CT HEAD Jun 28, 2017 FINDINGS: MRI HEAD FINDINGS-Multiple sequences are mildly motion degraded. INTRACRANIAL CONTENTS: No reduced diffusion to suggest acute ischemia. No susceptibility artifact to  suggest hemorrhage. The ventricles and sulci are normal for patient's age. Patchy supratentorial white matter  FLAIR T2 hyperintensities compatible with mild to moderate chronic small vessel ischemic changes, normal for age. 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 Suspicious parenchymal signal, masses, mass effect. No abnormal extra-axial fluid collections. No extra-axial masses. VASCULAR: Normal major intracranial vascular flow voids present at skull base. SKULL AND UPPER CERVICAL SPINE: No abnormal sellar expansion. No suspicious calvarial bone marrow signal. Craniocervical junction maintained. SINUSES/ORBITS: RIGHT mastoid effusion. Trace paranasal sinus mucosal thickening.The included ocular globes and orbital contents are non-suspicious. OTHER: None. MRA HEAD FINDINGS-moderately motion degraded examination. ANTERIOR CIRCULATION: Normal flow related enhancement of the included cervical, petrous, cavernous and supraclinoid internal carotid arteries. Patent anterior communicating artery, 4 mm superiorly directed A-comm aneurysm. Severe stenosis RIGHT A2 origin versus motion artifact. Patent anterior and middle cerebral arteries, mild luminal irregularity seen with motion artifact or atherosclerosis. No large vessel occlusion, flow limiting stenosis. POSTERIOR CIRCULATION: Codominant vertebral arteries. Basilar artery is patent, with normal flow related enhancement of the main branch vessels. Patent posterior cerebral arteries, mild luminal irregularity seen with motion artifact or atherosclerosis. No large vessel occlusion, flow limiting stenosis,  aneurysm. 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 Source images and MIP images were reviewed. IMPRESSION: MRI HEAD: 1. No acute intracranial process on this motion degraded examination. 2. Mild-to-moderate chronic small vessel ischemic changes. Old RIGHT basal ganglia lacunar infarct. MRA HEAD: 1. Moderately motion degraded  examination. No emergent large vessel occlusion. 2. Severe stenosis versus motion artifact RIGHT A2 origin. 3. 4 mm A-comm aneurysm. Neuro-Interventional Radiology consultation is suggested to evaluate the appropriateness of potential treatment. Non-emergent evaluation can be arranged by calling [phone] during usual hours. Emergency evaluation can be requested by paging [phone]. Electronically Signed   By: Elon Alas M.D.   On: 06/28/2017 15:36   US Carotid Bilateral (at [facility] Only)  Result Date: 06/28/2017 CLINICAL DATA:  77 year old female with a history of stroke like symptoms. Cardiovascular risk factors include hypertension, known stroke/TIA EXAM: BILATERAL CAROTID DUPLEX ULTRASOUND TECHNIQUE: Pearline Cables scale imaging, color Doppler and duplex ultrasound were performed of bilateral carotid and vertebral arteries in the neck. COMPARISON:  None FINDINGS: Criteria: Quantification of carotid stenosis is based on velocity parameters that correlate the residual internal carotid diameter with NASCET-based stenosis levels, using the diameter of the distal internal carotid lumen as the denominator for stenosis measurement. The following velocity measurements were obtained: RIGHT ICA:  Systolic 61 cm/sec, Diastolic 32 cm/sec CCA:  69 cm/sec SYSTOLIC ICA/CCA RATIO:  0.9 ECA:  67 cm/sec LEFT ICA:  Systolic 65 cm/sec, Diastolic 31 cm/sec CCA:  67 cm/sec SYSTOLIC ICA/CCA RATIO:  1.0 ECA:  67 cm/sec Right Brachial SBP: Not acquired Left Brachial SBP: Not acquired RIGHT CAROTID ARTERY: No significant calcified disease of the right common carotid artery. Intermediate waveform maintained. Heterogeneous plaque without significant calcifications at the right carotid bifurcation. Low resistance waveform of the right ICA. No significant tortuosity. RIGHT VERTEBRAL ARTERY: Antegrade flow with low resistance waveform. LEFT CAROTID ARTERY: No significant calcified disease of the left common carotid artery.  Intermediate waveform maintained. Heterogeneous plaque at the left carotid bifurcation without significant calcifications. Low resistance waveform of the left ICA. LEFT VERTEBRAL ARTERY:  Antegrade flow with low resistance waveform. IMPRESSION: Color duplex indicates minimal heterogeneous plaque, with no hemodynamically significant stenosis by duplex criteria in the extracranial cerebrovascular circulation. Signed, Dulcy Fanny. Earleen Newport, DO [facility] Radiology Electronically Signed   By: Corrie Mckusick D.O.   On: 06/28/2017 17:17   Mr Jodene Nam  Head/brain Wo Cm  Result Date: 06/28/2017 CLINICAL DATA:  Altered mental status and slurred speech beginning this morning. Aphasic without motor deficits. History of hypertension. EXAM: MRI HEAD WITHOUT CONTRAST MRA HEAD WITHOUT CONTRAST TECHNIQUE: Multiplanar, multiecho pulse sequences of the brain and surrounding structures were obtained without intravenous contrast. Angiographic images of the head were obtained using MRA technique without contrast. COMPARISON:  CT HEAD Jun 28, 2017 FINDINGS: MRI HEAD FINDINGS-Multiple sequences are mildly motion degraded. INTRACRANIAL CONTENTS: No reduced diffusion to suggest acute ischemia. No susceptibility artifact to suggest hemorrhage. The ventricles and sulci are normal for patient's age. Patchy supratentorial white matter FLAIR T2 hyperintensities compatible with mild to moderate chronic small vessel ischemic changes, normal for age. 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 Suspicious parenchymal signal, masses, mass effect. No abnormal extra-axial fluid collections. No extra-axial masses. VASCULAR: Normal major intracranial vascular flow voids present at skull base. SKULL AND UPPER CERVICAL SPINE: No abnormal sellar expansion. No suspicious calvarial bone marrow signal. Craniocervical junction maintained. SINUSES/ORBITS: RIGHT mastoid effusion. Trace paranasal sinus mucosal thickening.The  included ocular globes and orbital contents are non-suspicious. OTHER: None. MRA HEAD FINDINGS-moderately motion degraded examination. ANTERIOR CIRCULATION: Normal flow related enhancement of the included cervical, petrous, cavernous and supraclinoid internal carotid arteries. Patent anterior communicating artery, 4 mm superiorly directed A-comm aneurysm. Severe stenosis RIGHT A2 origin versus motion artifact. Patent anterior and middle cerebral arteries, mild luminal irregularity seen with motion artifact or atherosclerosis. No large vessel occlusion, flow limiting stenosis. POSTERIOR CIRCULATION: Codominant vertebral arteries. Basilar artery is patent, with normal flow related enhancement of the main branch vessels. Patent posterior cerebral arteries, mild luminal irregularity seen with motion artifact or atherosclerosis. No large vessel occlusion, flow limiting stenosis,  aneurysm. 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 Source images and MIP images were reviewed. IMPRESSION: MRI HEAD: 1. No acute intracranial process on this motion degraded examination. 2. Mild-to-moderate chronic small vessel ischemic changes. Old RIGHT basal ganglia lacunar infarct. MRA HEAD: 1. Moderately motion degraded examination. No emergent large vessel occlusion. 2. Severe stenosis versus motion artifact RIGHT A2 origin. 3. 4 mm A-comm aneurysm. Neuro-Interventional Radiology consultation is suggested to evaluate the appropriateness of potential treatment. Non-emergent evaluation can be arranged by calling [phone] during usual hours. Emergency evaluation can be requested by paging [phone]. Electronically Signed   By: Elon Alas M.D.   On: 06/28/2017 15:36   Ct Head Code Stroke Wo Contrast  Addendum Date: 06/28/2017   ADDENDUM REPORT: 06/28/2017 12:27 ADDENDUM: Study discussed by telephone with Dr. Lenise Arena on 06/28/2017 [id number] hours. Electronically Signed    By: Genevie Ann M.D.   On: 06/28/2017 12:27   Result Date: 06/28/2017 CLINICAL DATA:  Code stroke. 77 year old female with abnormal speech beginning 4 hours ago. Expressive aphasia. EXAM: CT HEAD WITHOUT CONTRAST TECHNIQUE: Contiguous axial images were obtained from the base of the skull through the vertex without intravenous contrast. COMPARISON:  Head CT without contrast 07/25/2008. FINDINGS: Brain: Minimal heterogeneity at the right caudate nucleus is stable since 2010 and probably a perivascular space, but there is new mild white matter hypodensity at the anterior limb of the right internal capsule since 2010. No midline shift, ventriculomegaly, mass effect, evidence of mass lesion, intracranial hemorrhage or evidence of cortically based acute infarction. No cortical encephalomalacia identified. Vascular: No suspicious intracranial vascular hyperdensity. Skull: No acute osseous abnormality identified. Sinuses/Orbits: Stable and well pneumatized. Bilateral nasal cavity mucosal thickening and small volume retained secretions today. Mild right mastoid effusion. Other:  Disconjugate gaze similar to the prior CT. No acute orbit or scalp soft tissue findings. ASPECTS [facility] Stroke Program Early CT Score) - Ganglionic level infarction (caudate, lentiform nuclei, internal capsule, insula, M1- M3 cortex): 7 - Supraganglionic infarction (M4-M6 cortex): 3 Total score (0-10 with 10 being normal): 10 IMPRESSION: 1. No acute cortically based infarct or acute intracranial hemorrhage identified. 2. ASPECTS is 10. 3. Mild for age cerebral white matter disease has progressed since 2010. Electronically Signed: By: Genevie Ann M.D. On: 06/28/2017 12:09     ASSESSMENT AND PLAN:   Active Problems:   Aphasia  77 year old female patient with slurred speech, MRI of the brain did not show acute stroke.  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  Ultrasound of carotids did not show any hemodynamically significant stenosis,  echocardiogram showed EF 60 to 65%.  Patient on aspirin, neurology recommended Plavix instead of aspirin.,  Continue aggressive lipid management with LDL less than 70.  Also recommended EEG. 2.  reCurrent admissions for the different problem but she is admitted 6 times in the last 6 months.  Followed by [facility] care management 3 .history of COPD, essential hypertension: Patient admitted in April for acute on chronic respiratory failure with hypoxia. #4. CKD stage III: Stable, hyperkalemia on admission improved patient 5.1 on admission and it is 4.8 today. 5 dysarthria, seen by speech therapy, started on dysphagia 3 diet. 6.  Depression: Continue Prozac 10 mg daily 7 essential hypertension: Controlled, continue hydralazine 8 chronic respiratory failure, COPD: Stable, no wheezing. All the records are reviewed and case discussed with Care Management/Social Workerr. Management plans discussed with the patient, family and they are in agreement.  CODE STATUS: full TOTAL TIME TAKING CARE OF THIS PATIENT: 40minutes.   POSSIBLE D/C IN 1-2DAYS, DEPENDING ON CLINICAL CONDITION.   Epifanio Lesches M.D on 06/29/2017 at 1:59 PM  Between 7am to 6pm - Pager - [phone]  After 6pm go to [email or web address] - password [email or web address] [facility]  Office  [phone]  CC: Primary care physician; [name], NP

## 2017-06-29 NOTE — Progress Notes (Signed)
OT Cancellation Note  Patient Details Name: [name] MRN: [id number] DOB: 22-May-1940   Cancelled Treatment:    Reason Eval/Treat Not Completed: Fatigue/lethargy limiting ability to participate(Pt. sleeping upon arrival attempted initial OT eval this morning. Upon opening her eyes, the pt. requested therapist to reattempt at a later time. Will reattempt initial OT visit at a later time.)  Harrel Carina, MS, OTR/L 06/29/2017, 11:28 AM

## 2017-06-29 NOTE — Progress Notes (Signed)
Subjective: Speech improved.  Reports that it is not completely back to baseline.    Objective: Current vital signs: BP 112/66   Pulse 90   Temp 98.2 F (36.8 C) (Oral)   Resp 18   Ht 5\' 1"  (1.549 m)   Wt 112.9 kg (249 lb)   SpO2 100%   BMI 47.05 kg/m  Vital signs in last 24 hours: Temp:  [97.7 F (36.5 C)-98.6 F (37 C)] 98.2 F (36.8 C) (05/17 0500) Pulse Rate:  [72-107] 90 (05/17 0500) Resp:  [13-29] 18 (05/17 0500) BP: (109-141)/(66-117) 112/66 (05/17 1048) SpO2:  [100 %] 100 % (05/17 0500) Weight:  [112.9 kg (249 lb)] 112.9 kg (249 lb) (05/16 1148)  Intake/Output from previous day: 05/16 0701 - 05/17 0700 In: -  Out: 900 [Urine:900] Intake/Output this shift: Total I/O In: 360 [P.O.:360] Out: -  Nutritional status:  Diet Order           DIET DYS 3 Room service appropriate? Yes with Assist; Fluid consistency: Thin  Diet effective now          Neurologic Exam: Mental Status: Alert, oriented, thought content appropriate.  Speech fluent without evidence of aphasia when patient speaking spontaneously.  When I first entered the room patient with some word finding/stuttering issues.  Able to follow 3 step commands without difficulty. Cranial Nerves: II: Discs flat bilaterally; Visual fields grossly normal, pupils equal, round, reactive to light and accommodation III,IV, VI: ptosis not present, extra-ocular motions intact bilaterally V,VII: smile symmetric, facial light touch sensation normal bilaterally VIII: hearing normal bilaterally IX,X: gag reflex present XI: bilateral shoulder shrug XII: midline tongue extension Motor: Right : Upper extremity   5/5    Left:     Upper extremity   5/5  Lower extremity   5/5     Lower extremity   5/5 Tone and bulk:normal tone throughout; no atrophy noted Sensory: Pinprick and light touch intact throughout, bilaterally   Lab Results: Basic Metabolic Panel: Recent Labs  Lab 06/28/17 1151 06/28/17 1226  NA 143 143  K  5.1 4.8  CL 108 109  CO2 28 27  GLUCOSE 121* 114*  BUN 20 21*  CREATININE 1.73* 1.69*  CALCIUM 9.3 9.2    Liver Function Tests: Recent Labs  Lab 06/28/17 1151 06/28/17 1226  AST 22 18  ALT 17 17  ALKPHOS 64 67  BILITOT 0.8 0.5  PROT 6.7 6.7  ALBUMIN 3.4* 3.4*   No results for input(s): LIPASE, AMYLASE in the last 168 hours. No results for input(s): AMMONIA in the last 168 hours.  CBC: Recent Labs  Lab 06/28/17 1226  WBC 5.4  NEUTROABS 3.3  HGB 12.6  HCT 36.8  MCV 95.1  PLT 181    Cardiac Enzymes: Recent Labs  Lab 06/28/17 1151  TROPONINI <0.03    Lipid Panel: Recent Labs  Lab 06/29/17 0543  CHOL 171  TRIG 91  HDL 50  CHOLHDL 3.4  VLDL 18  LDLCALC 103*    CBG: Recent Labs  Lab 06/28/17 1150  GLUCAP 106*    Microbiology: Results for orders placed or performed during the hospital encounter of 06/12/17  MRSA PCR Screening     Status: None   Collection Time: 06/12/17  8:59 AM  Result Value Ref Range Status   MRSA by PCR NEGATIVE NEGATIVE Final    Comment:        The GeneXpert MRSA Assay (FDA approved for NASAL specimens only), is one component of a comprehensive  MRSA colonization surveillance program. It is not intended to diagnose MRSA infection nor to guide or monitor treatment for MRSA infections. Performed at [facility], [address] [place], [place] [address]   Culture, expectorated sputum-assessment     Status: None   Collection Time: 06/12/17 11:58 PM  Result Value Ref Range Status   Specimen Description SPUTUM  Final   Special Requests NONE  Final   Sputum evaluation   Final    THIS SPECIMEN IS ACCEPTABLE FOR SPUTUM CULTURE Performed at [facility], [address], [place], [place] [address]    Report Status 06/13/2017 FINAL  Final  Culture, respiratory (NON-Expectorated)     Status: None   Collection Time: 06/12/17 11:58 PM  Result Value Ref Range Status   Specimen Description   Final     SPUTUM Performed at [facility], [address], [place], [place] [address]    Special Requests   Final    NONE Reflexed from [id number] Performed at [facility], [address] [place], [place] [address]    Gram Stain   Final    RARE WBC PRESENT, PREDOMINANTLY PMN FEW YEAST RARE GRAM POSITIVE RODS RARE GRAM POSITIVE COCCI Performed at [facility], [place] [address] [place], [place] [address]    Culture FEW CANDIDA TROPICALIS  Final   Report Status 06/15/2017 FINAL  Final    Coagulation Studies: No results for input(s): LABPROT, INR in the last 72 hours.  Imaging: Mr Brain Wo Contrast  Result Date: 06/28/2017 CLINICAL DATA:  Altered mental status and slurred speech beginning this morning. Aphasic without motor deficits. History of hypertension. EXAM: MRI HEAD WITHOUT CONTRAST MRA HEAD WITHOUT CONTRAST TECHNIQUE: Multiplanar, multiecho pulse sequences of the brain and surrounding structures were obtained without intravenous contrast. Angiographic images of the head were obtained using MRA technique without contrast. COMPARISON:  CT HEAD Jun 28, 2017 FINDINGS: MRI HEAD FINDINGS-Multiple sequences are mildly motion degraded. INTRACRANIAL CONTENTS: No reduced diffusion to suggest acute ischemia. No susceptibility artifact to suggest hemorrhage. The ventricles and sulci are normal for patient's age. Patchy supratentorial white matter FLAIR T2 hyperintensities compatible with mild to moderate chronic small vessel ischemic changes, normal for age. 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 Suspicious parenchymal signal, masses, mass effect. No abnormal extra-axial fluid collections. No extra-axial masses. VASCULAR: Normal major intracranial vascular flow voids present at skull base. SKULL AND UPPER CERVICAL SPINE: No abnormal sellar expansion. No suspicious calvarial bone marrow signal. Craniocervical junction maintained. SINUSES/ORBITS: RIGHT mastoid effusion. Trace paranasal sinus  mucosal thickening.The included ocular globes and orbital contents are non-suspicious. OTHER: None. MRA HEAD FINDINGS-moderately motion degraded examination. ANTERIOR CIRCULATION: Normal flow related enhancement of the included cervical, petrous, cavernous and supraclinoid internal carotid arteries. Patent anterior communicating artery, 4 mm superiorly directed A-comm aneurysm. Severe stenosis RIGHT A2 origin versus motion artifact. Patent anterior and middle cerebral arteries, mild luminal irregularity seen with motion artifact or atherosclerosis. No large vessel occlusion, flow limiting stenosis. POSTERIOR CIRCULATION: Codominant vertebral arteries. Basilar artery is patent, with normal flow related enhancement of the main branch vessels. Patent posterior cerebral arteries, mild luminal irregularity seen with motion artifact or atherosclerosis. No large vessel occlusion, flow limiting stenosis,  aneurysm. 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 Source images and MIP images were reviewed. IMPRESSION: MRI HEAD: 1. No acute intracranial process on this motion degraded examination. 2. Mild-to-moderate chronic small vessel ischemic changes. Old RIGHT basal ganglia lacunar infarct. MRA HEAD: 1.  Moderately motion degraded examination. No emergent large vessel occlusion. 2. Severe stenosis versus motion artifact RIGHT A2 origin. 3. 4 mm A-comm aneurysm. Neuro-Interventional Radiology consultation is suggested to evaluate the appropriateness of potential treatment. Non-emergent evaluation can be arranged by calling [phone] during usual hours. Emergency evaluation can be requested by paging [phone]. Electronically Signed   By: Elon Alas M.D.   On: 06/28/2017 15:36   US Carotid Bilateral (at [facility] Only)  Result Date: 06/28/2017 CLINICAL DATA:  77 year old female with a history of stroke like symptoms. Cardiovascular risk factors include  hypertension, known stroke/TIA EXAM: BILATERAL CAROTID DUPLEX ULTRASOUND TECHNIQUE: Pearline Cables scale imaging, color Doppler and duplex ultrasound were performed of bilateral carotid and vertebral arteries in the neck. COMPARISON:  None FINDINGS: Criteria: Quantification of carotid stenosis is based on velocity parameters that correlate the residual internal carotid diameter with NASCET-based stenosis levels, using the diameter of the distal internal carotid lumen as the denominator for stenosis measurement. The following velocity measurements were obtained: RIGHT ICA:  Systolic 61 cm/sec, Diastolic 32 cm/sec CCA:  69 cm/sec SYSTOLIC ICA/CCA RATIO:  0.9 ECA:  67 cm/sec LEFT ICA:  Systolic 65 cm/sec, Diastolic 31 cm/sec CCA:  67 cm/sec SYSTOLIC ICA/CCA RATIO:  1.0 ECA:  67 cm/sec Right Brachial SBP: Not acquired Left Brachial SBP: Not acquired RIGHT CAROTID ARTERY: No significant calcified disease of the right common carotid artery. Intermediate waveform maintained. Heterogeneous plaque without significant calcifications at the right carotid bifurcation. Low resistance waveform of the right ICA. No significant tortuosity. RIGHT VERTEBRAL ARTERY: Antegrade flow with low resistance waveform. LEFT CAROTID ARTERY: No significant calcified disease of the left common carotid artery. Intermediate waveform maintained. Heterogeneous plaque at the left carotid bifurcation without significant calcifications. Low resistance waveform of the left ICA. LEFT VERTEBRAL ARTERY:  Antegrade flow with low resistance waveform. IMPRESSION: Color duplex indicates minimal heterogeneous plaque, with no hemodynamically significant stenosis by duplex criteria in the extracranial cerebrovascular circulation. Signed, Dulcy Fanny. Earleen Newport, DO [facility] Radiology Electronically Signed   By: Corrie Mckusick D.O.   On: 06/28/2017 17:17   Mr Jodene Nam Head/brain PN Cm  Result Date: 06/28/2017 CLINICAL DATA:  Altered  mental status and slurred speech beginning this morning. Aphasic without motor deficits. History of hypertension. EXAM: MRI HEAD WITHOUT CONTRAST MRA HEAD WITHOUT CONTRAST TECHNIQUE: Multiplanar, multiecho pulse sequences of the brain and surrounding structures were obtained without intravenous contrast. Angiographic images of the head were obtained using MRA technique without contrast. COMPARISON:  CT HEAD Jun 28, 2017 FINDINGS: MRI HEAD FINDINGS-Multiple sequences are mildly motion degraded. INTRACRANIAL CONTENTS: No reduced diffusion to suggest acute ischemia. No susceptibility artifact to suggest hemorrhage. The ventricles and sulci are normal for patient's age. Patchy supratentorial white matter FLAIR T2 hyperintensities compatible with mild to moderate chronic small vessel ischemic changes, normal for age. 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 Suspicious parenchymal signal, masses, mass effect. No abnormal extra-axial fluid collections. No extra-axial masses. VASCULAR: Normal major intracranial vascular flow voids present at skull base. SKULL AND UPPER CERVICAL SPINE: No abnormal sellar expansion. No suspicious calvarial bone marrow signal. Craniocervical junction maintained. SINUSES/ORBITS: RIGHT mastoid effusion. Trace paranasal sinus mucosal thickening.The included ocular globes and orbital contents are non-suspicious. OTHER: None. MRA HEAD FINDINGS-moderately motion degraded examination. ANTERIOR CIRCULATION: Normal flow related enhancement of the included cervical, petrous, cavernous and supraclinoid internal carotid arteries. Patent anterior communicating artery, 4 mm superiorly directed A-comm aneurysm. Severe stenosis RIGHT A2 origin versus motion artifact. Patent anterior and  middle cerebral arteries, mild luminal irregularity seen with motion artifact or atherosclerosis. No large vessel occlusion, flow limiting stenosis. POSTERIOR CIRCULATION: Codominant vertebral arteries. Basilar artery is patent,  with normal flow related enhancement of the main branch vessels. Patent posterior cerebral arteries, mild luminal irregularity seen with motion artifact or atherosclerosis. No large vessel occlusion, flow limiting stenosis,  aneurysm. 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 Source images and MIP images were reviewed. IMPRESSION: MRI HEAD: 1. No acute intracranial process on this motion degraded examination. 2. Mild-to-moderate chronic small vessel ischemic changes. Old RIGHT basal ganglia lacunar infarct. MRA HEAD: 1. Moderately motion degraded examination. No emergent large vessel occlusion. 2. Severe stenosis versus motion artifact RIGHT A2 origin. 3. 4 mm A-comm aneurysm. Neuro-Interventional Radiology consultation is suggested to evaluate the appropriateness of potential treatment. Non-emergent evaluation can be arranged by calling [phone] during usual hours. Emergency evaluation can be requested by paging [phone]. Electronically Signed   By: Elon Alas M.D.   On: 06/28/2017 15:36   Ct Head Code Stroke Wo Contrast  Addendum Date: 06/28/2017   ADDENDUM REPORT: 06/28/2017 12:27 ADDENDUM: Study discussed by telephone with Dr. Lenise Arena on 06/28/2017 [id number] hours. Electronically Signed   By: Genevie Ann M.D.   On: 06/28/2017 12:27   Result Date: 06/28/2017 CLINICAL DATA:  Code stroke. 77 year old female with abnormal speech beginning 4 hours ago. Expressive aphasia. EXAM: CT HEAD WITHOUT CONTRAST TECHNIQUE: Contiguous axial images were obtained from the base of the skull through the vertex without intravenous contrast. COMPARISON:  Head CT without contrast 07/25/2008. FINDINGS: Brain: Minimal heterogeneity at the right caudate nucleus is stable since 2010 and probably a perivascular space, but there is new mild white matter hypodensity at the anterior limb of the right internal capsule since 2010. No midline shift, ventriculomegaly, mass  effect, evidence of mass lesion, intracranial hemorrhage or evidence of cortically based acute infarction. No cortical encephalomalacia identified. Vascular: No suspicious intracranial vascular hyperdensity. Skull: No acute osseous abnormality identified. Sinuses/Orbits: Stable and well pneumatized. Bilateral nasal cavity mucosal thickening and small volume retained secretions today. Mild right mastoid effusion. Other: Disconjugate gaze similar to the prior CT. No acute orbit or scalp soft tissue findings. ASPECTS [facility] Stroke Program Early CT Score) - Ganglionic level infarction (caudate, lentiform nuclei, internal capsule, insula, M1- M3 cortex): 7 - Supraganglionic infarction (M4-M6 cortex): 3 Total score (0-10 with 10 being normal): 10 IMPRESSION: 1. No acute cortically based infarct or acute intracranial hemorrhage identified. 2. ASPECTS is 10. 3. Mild for age cerebral white matter disease has progressed since 2010. Electronically Signed: By: Genevie Ann M.D. On: 06/28/2017 12:09    Medications:  I have reviewed the patient's current medications. Scheduled: . atorvastatin  20 mg Oral q1800  . clopidogrel  75 mg Oral Daily  . FLUoxetine  10 mg Oral Daily  . heparin  5,000 Units Subcutaneous Q8H  . hydrALAZINE  25 mg Oral BID  . loratadine  10 mg Oral Daily  . mouth rinse  15 mL Mouth Rinse BID  . pantoprazole  40 mg Oral Daily  . tiotropium  18 mcg Inhalation Daily  . traZODone  50 mg Oral QHS    Assessment/Plan: Patient improved.  MRI of the brain reviewed and shows no acute changes.  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  Carotid dopplers show no evidence of hemodynamically significant stenosis.  Echocardiogram shows no cardiac source of emboli with an EF of 60-65%.  A1c 6.5, LDL 103.  Although with unusual presentation can not rule out TIA since patient with vascular risk factors.  Will rule out seizure as well since with chronic infarct on imaging.  On ASA.    Recommendations: 1.  Start  Plavix 75mg  daily.  May replace ASA with Plavix 2.  Follow up with neuro-interventional radiology for aneurysm 3.  More aggressive lipid management with target LDL<70.   4.  EEG   LOS: 1 day   Alexis Goodell, MD Neurology [phone] 06/29/2017  11:27 AM

## 2017-06-29 NOTE — Care Management Obs Status (Signed)
[facility] NOTIFICATION   Patient Details  Name: Katherine [name] MRN: [id number] Date of Birth: 29-Apr-1940   Medicare Observation Status Notification Given:  Yes    Shelbie Ammons, RN 06/29/2017, 2:59 PM

## 2017-06-29 NOTE — Progress Notes (Signed)
OT Cancellation Note  Patient Details Name: Sharline Lehane MRN: [id number] DOB: May 31, 1940   Cancelled Treatment:    Reason Eval/Treat Not Completed: Patient at procedure or test/ unavailable. Order received, chart reviewed. Upon attempt, pt having procedure in room. Unavailable. Will re-attempt at later date/time as appropriate.   Jeni Salles, MPH, MS, OTR/L ascom [phone] 06/29/17, 1:57 PM

## 2017-06-29 NOTE — Progress Notes (Signed)
SLP Cancellation Note  Patient Details Name: Tekia Waterbury MRN: [id number] DOB: 03-24-1940   Cancelled treatment:       Reason Eval/Treat Not Completed: Patient at procedure or test/unavailable(pt has been unavailable x2 attempts; will f/u  ). Chart reviewed; consulted NSG and witnessed pt walking AND talking w/ PT during session. Pt's speech FLUENCY is much improved, especially for spontaneous words and phrases. Pt still exhibits mild DYSFLUENCY intermittently but is able to complete her sentence if she slows down, takes a breath, and relaxes. Encouraged her to start the sentence again if she needs to - gets "too stuck". Recommend pt f/u w/ Outpatient ST services if her speech FLUENCY does not return to her baseline. Pt's speech Articulation is impacted by Edentulous status. Pt does NOT exhibit any overt receptive or expressive Aphasia in her engagement w/ staff and MD. NSG and CM updated. Handouts given to pt; f/u on an Outpatient basis as needed. Pt agreed.    Orinda Kenner, [place], CCC-SLP Watson,Katherine 06/29/2017, 3:34 PM

## 2017-06-29 NOTE — Progress Notes (Signed)
PT Cancellation Note  Patient Details Name: Katherine [name] MRN: [id number] DOB: 1940-08-09   Cancelled Treatment:    Reason Eval/Treat Not Completed: Other (comment)(Consult received and chart reviewed.  Patient sleeping soundly upon arrival to session.  Awakens to voice/touch, but requests therapist allow her to return to sleep and "wake up on my own" to complete evaluation at later time.  Will continue efforts at later time/date as patient agreeable and available.)   Adolph Clutter H. Owens Shark, PT, DPT, NCS 06/29/17, 10:29 AM [phone]

## 2017-06-29 NOTE — Evaluation (Signed)
Physical Therapy Evaluation Patient Details Name: Katherine [name] MRN: [id number] DOB: [birth year] Today's Date: 06/29/2017   History of Present Illness  presented to ER secondary to slurred speech, AMS, L-sided weakness; admitted for TIA/CVA work-up due to acute, expressive aphasia.  MRI negative for acute change (though unable to rule out TIA at this time per neuro)  Clinical Impression  Upon evaluation, patient alert and oriented; follows all commands and demonstrates good effort with all functional activities. Mild/mod expressive speech deficits (stuttering, dysfluency), but able to maintain simple conversation and make needs/wants known.  Bilat UE/LE strength and ROM grossly symmetrical and WFL; no focal weakness, sensory deficit noted.  Able to complete bed mobility with mod indep; sit/stand, basic transfers and gait (100') with RW, cga/min assist.  Forward flexed posture, broad BOS; no overt buckling or LOB.  Mod SOB with exertion, though sats >92% on 2L throughout session.  Did reinforce role/importance of RW (for optimal safety), activity pacing and energy conservation.  Patient voiced understanding of all information. Would benefit from skilled PT to address above deficits and promote optimal return to PLOF; Recommend transition to [place] upon discharge from acute hospitalization.     Follow Up Recommendations Home health PT    Equipment Recommendations  (has necessary equipment)    Recommendations for Other Services       Precautions / Restrictions Precautions Precautions: Fall Restrictions Weight Bearing Restrictions: No      Mobility  Bed Mobility Overal bed mobility: Modified Independent                Transfers Overall transfer level: Needs assistance Equipment used: Rolling walker (2 wheeled) Transfers: Sit to/from Stand Sit to Stand: Min guard         General transfer comment: requires use of momentum, UE support to  complete  Ambulation/Gait Ambulation/Gait assistance: Min guard Ambulation Distance (Feet): 100 Feet Assistive device: Rolling walker (2 wheeled)       General Gait Details: forward trunk flexion, RW arms length anterior to patient; broad BOS with choppy steps, but no overt buckling or LOB.  Mod SOB with exertion; sats >92% on 2L  Stairs            Wheelchair Mobility    Modified Rankin (Stroke Patients Only)       Balance Overall balance assessment: Needs assistance Sitting-balance support: No upper extremity supported;Feet supported Sitting balance-Leahy Scale: Good     Standing balance support: Bilateral upper extremity supported Standing balance-Leahy Scale: Fair                               Pertinent Vitals/Pain Pain Assessment: No/denies pain    Home Living Family/patient expects to be discharged to:: Private residence Living Arrangements: Spouse/significant other Available Help at Discharge: Family;Available 24 hours/day Type of Home: House Home Access: Level entry     Home Layout: One level Home Equipment: Walker - 2 wheels;Walker - standard;Cane - single point      Prior Function           Comments: Pt reports that she is independent with SPC for household distances only (community distances for MD appointments only); Pt reports husband helps w/ ADLs and driving; home O2 at 2L; denies recent fall history.     Hand Dominance   Dominant Hand: Right    Extremity/Trunk Assessment   Upper Extremity Assessment Upper Extremity Assessment: Overall WFL for tasks assessed(grossly at least 4/5 throughout; denies  sensory deficit)    Lower Extremity Assessment Lower Extremity Assessment: Overall WFL for tasks assessed(grossly at least 4/5 throughout; denies sensory deficit)       Communication   Communication: Expressive difficulties(stuttering, dysfluency)  Cognition Arousal/Alertness: Awake/alert Behavior During Therapy: WFL for  tasks assessed/performed Overall Cognitive Status: Within Functional Limits for tasks assessed                                        General Comments      Exercises Other Exercises Other Exercises: Toilet transfer, SPT without assist device, cga/min assist; does require UE support for external stabilization.  Standing balance for hygiene, clothing management, cga Other Exercises: Reviewed/educated in role of energy conservation, activity pacing; do recommend use of RW for optimal safety.  Patient voiced understanding and agreement with all information provided.   Assessment/Plan    PT Assessment Patient needs continued PT services  PT Problem List Decreased activity tolerance;Decreased balance;Decreased mobility;Cardiopulmonary status limiting activity       PT Treatment Interventions DME instruction;Gait training;Functional mobility training;Therapeutic activities;Therapeutic exercise;Balance training;Patient/family education    PT Goals (Current goals can be found in the Care Plan section)  Acute Rehab PT Goals Patient Stated Goal: to return home PT Goal Formulation: With patient Time For Goal Achievement: 07/13/17 Potential to Achieve Goals: Good    Frequency Min 2X/week   Barriers to discharge Decreased caregiver support      Co-evaluation               AM-PAC PT "6 Clicks" Daily Activity  Outcome Measure Difficulty turning over in bed (including adjusting bedclothes, sheets and blankets)?: None Difficulty moving from lying on back to sitting on the side of the bed? : None Difficulty sitting down on and standing up from a chair with arms (e.g., wheelchair, bedside commode, etc,.)?: A Little Help needed moving to and from a bed to chair (including a wheelchair)?: A Little Help needed walking in hospital room?: A Little Help needed climbing 3-5 steps with a railing? : A Little 6 Click Score: 20    End of Session Equipment Utilized During  Treatment: Gait belt;Oxygen Activity Tolerance: Patient tolerated treatment well Patient left: in chair;with call bell/phone within reach;with chair alarm set Nurse Communication: Mobility status PT Visit Diagnosis: Muscle weakness (generalized) (M62.81);Difficulty in walking, not elsewhere classified (R26.2)    Time: 9024-0973 PT Time Calculation (min) (ACUTE ONLY): 24 min   Charges:   PT Evaluation $PT Eval Moderate Complexity: 1 Mod PT Treatments $Therapeutic Activity: 8-22 mins   PT G Codes:        Jaysten Essner H. Owens Shark, PT, DPT, NCS 06/29/17, 3:10 PM [phone]

## 2017-06-30 DIAGNOSIS — J449 Chronic obstructive pulmonary disease, unspecified: Secondary | ICD-10-CM | POA: Diagnosis not present

## 2017-06-30 DIAGNOSIS — R4701 Aphasia: Secondary | ICD-10-CM | POA: Diagnosis not present

## 2017-06-30 DIAGNOSIS — J9610 Chronic respiratory failure, unspecified whether with hypoxia or hypercapnia: Secondary | ICD-10-CM | POA: Diagnosis not present

## 2017-06-30 MED ORDER — CLOPIDOGREL BISULFATE 75 MG PO TABS: 75.0000 mg | ORAL_TABLET | Freq: Every day | ORAL | 0 refills | Status: DC

## 2017-06-30 MED ORDER — ATORVASTATIN CALCIUM 20 MG PO TABS: 20.0000 mg | ORAL_TABLET | Freq: Every day | ORAL | 0 refills | Status: DC

## 2017-06-30 NOTE — Evaluation (Signed)
Occupational Therapy Evaluation Patient Details Name: Katherine [name] MRN: [id number] DOB: Aug 04, 1940 Today's Date: 06/30/2017    History of Present Illness presented to ER secondary to slurred speech, AMS, L-sided weakness; admitted for TIA/CVA work-up due to acute, expressive aphasia.  MRI negative for acute change (though unable to rule out TIA at this time per neuro)   Clinical Impression   Patient was lying in bed when OT arrived. Patient was pleasant and willing to work with OT. Patient was cognitively aware and able to perform problem solving. She states husband helps with many BADL and IADL tasks due to her decreased endurance. Educated in use of AE for lower body dressing, but she states her husband helps her so she doesn't need AE at this time. Patient would benefit from [place] to focus on safety in the home, energy conservation techniques, and AE/DME use to maintain ADL independence. Patient verbalized understanding.    Follow Up Recommendations  Home health OT    Equipment Recommendations       Recommendations for Other Services       Precautions / Restrictions Precautions Precautions: Fall Restrictions Weight Bearing Restrictions: No      Mobility Bed Mobility Overal bed mobility: Modified Independent                Transfers Overall transfer level: Needs assistance Equipment used: Rolling walker (2 wheeled) Transfers: Sit to/from Stand Sit to Stand: Min guard              Balance                                           ADL either performed or assessed with clinical judgement   ADL Overall ADL's : At baseline                                       General ADL Comments: Patient states that she has difficulty with lower body dressing and some bathing due to COPD. States husband has been helping her with ADL tasks prior to hospitalization.     Vision Patient Visual Report: No change from baseline       Perception     Praxis      Pertinent Vitals/Pain Pain Assessment: No/denies pain     Hand Dominance Right   Extremity/Trunk Assessment Upper Extremity Assessment Upper Extremity Assessment: Overall WFL for tasks assessed   Lower Extremity Assessment Lower Extremity Assessment: Defer to PT evaluation       Communication Communication Communication: Expressive difficulties(stuttering and dysfluency)   Cognition Arousal/Alertness: Awake/alert Behavior During Therapy: WFL for tasks assessed/performed Overall Cognitive Status: Within Functional Limits for tasks assessed                                     General Comments       Exercises     Shoulder Instructions      Home Living Family/patient expects to be discharged to:: Private residence Living Arrangements: Spouse/significant other Available Help at Discharge: Family;Available 24 hours/day Type of Home: House Home Access: Level entry     Home Layout: One level     Bathroom Shower/Tub: Teacher, early years/pre: Standard Bathroom Accessibility:  Yes   Home Equipment: Walker - 2 wheels;Walker - standard;Cane - single point          Prior Functioning/Environment Level of Independence: Needs assistance  Gait / Transfers Assistance Needed: Uses RW to walk short distances ADL's / Homemaking Assistance Needed: Receives assistance for bathing and dressing.   Comments: Pt reports that she is independent with SPC for household distances only (community distances for MD appointments only); Pt reports husband helps w/ ADLs and driving; home O2 at 2L; denies recent fall history.        OT Problem List: Decreased activity tolerance;Decreased knowledge of use of DME or AE      OT Treatment/Interventions:      OT Goals(Current goals can be found in the care plan section) Acute Rehab OT Goals Patient Stated Goal: to return home OT Goal Formulation: With patient Time For Goal  Achievement: 07/07/17 Potential to Achieve Goals: Good  OT Frequency:     Barriers to D/C:            Co-evaluation              AM-PAC PT "6 Clicks" Daily Activity     Outcome Measure Help from another person eating meals?: None Help from another person taking care of personal grooming?: None Help from another person toileting, which includes using toliet, bedpan, or urinal?: A Little Help from another person bathing (including washing, rinsing, drying)?: A Little Help from another person to put on and taking off regular upper body clothing?: A Little Help from another person to put on and taking off regular lower body clothing?: A Little 6 Click Score: 20   End of Session Equipment Utilized During Treatment: Gait belt;Rolling walker;Oxygen Nurse Communication: Mobility status  Activity Tolerance: Patient tolerated treatment well Patient left: in chair;with call bell/phone within reach  OT Visit Diagnosis: Muscle weakness (generalized) (M62.81)                Time: 1572-6203 OT Time Calculation (min): 25 min Charges:  OT General Charges $OT Visit: 1 Visit OT Evaluation $OT Eval Low Complexity: 1 Low OT Treatments $Self Care/Home Management : 8-22 mins G-Codes:     Amie [place], OTR/L  Avital Dancy L 06/30/2017, 3:03 PM

## 2017-06-30 NOTE — Discharge Summary (Signed)
Katherine [name], is a 77 y.o. female  DOB August 21, 1940  MRN [id number].  Admission date:  06/28/2017  Admitting Physician  Gorden Harms, MD  Discharge Date:  06/30/2017   Primary MD  [name], NP  Recommendations for primary care physician for things to follow:  Follow-up with PCP in 1 week Admission Diagnosis  Aphasia [R47.01]   Discharge Diagnosis  Aphasia [R47.01]    Active Problems:   Aphasia      Past Medical History:  Diagnosis Date  . Angioedema   . Anxiety   . Anxiety and depression   . Chronic constipation   . Chronic kidney disease    stage 3  . COPD (chronic obstructive pulmonary disease) ([place])   . Gallstones   . GERD (gastroesophageal reflux disease)   . Hypertension   . Left ventricular hypertrophy   . Osteoarthritis   . Prurigo nodularis   . Tobacco abuse   . Vitamin D deficiency disease     Past Surgical History:  Procedure Laterality Date  . CHOLECYSTECTOMY    . 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   Procedure: TOTAL KNEE ARTHROPLASTY;  Surgeon: Hessie Knows, MD;  Location: [facility] ORS;  Service: Orthopedics;  Laterality: Right;       History of present illness and  Hospital Course:     Kindly see H&P for history of present illness and admission details, please review complete Labs, Consult reports and Test reports for all details in brief  HPI  from the history and physical done on the day of admission 77 year old female patient with history of anxiety, depression, chronic respiratory failure with COPD on 2 L of oxygen, recurrent admissions, admitted 6 times in the last 6 months comes in with slurred speech at home.  Patient found to have mild hyperkalemia with potassium 5.1 on admission.  Patient admitted for evaluation of  stroke.  Hospital Course  #1 acute expressive aphasia: Patient admitted to stroke unit, initial CT head negative.  MRI of the brain also showed no stroke.  Patient also had carotid ultrasound, echocardiogram.  Seen by physical therapy, speech therapy, neurology.  And was taking aspirin at home but we did step up with Plavix.  Patient LDL is 103.  Her carotid ultrasound did not show any hemodynamically significant stenosis.  echoCardiogram showed EF 65%.  No wall motion normality.  Cardiogram did not show any cardiac source of emboli.  Patient also had EEG as per neurology recommendation and EEG also was negative. speechecommended dysphagia 3 diet with thin liquids.Physical l therapy recommended home health physical therapy. #2 chronic respiratory failure due to COPD: Patient on 2 L of oxygen.  [facility]'s.  Charge home with home health PT, RN.  Can continue her home dose inhalers with Spiriva, DuoNeb's. 3.  Hyperkalemia improved. #4 depression, continue Prozac 10 mg daily. 5 hyperlipidemia: Continue Lipitor 20 mg daily. 6./  CKD stage III: Stable creatinine around 1.6, GFR around 33.  Discharge Condition: stable   Follow UP      Discharge Instructions  and  Discharge Medications      Allergies as of 06/30/2017      Reactions   Bee Venom Swelling   Enalapril Maleate Swelling   Other    Bee stings      Medication List    STOP taking these medications   aspirin EC 81 MG tablet Replaced by:  clopidogrel 75 MG tablet     TAKE these medications   acetaminophen 650 MG CR tablet Commonly known as:  TYLENOL Take 650-1,300 mg by mouth every 8 (eight) hours as needed for pain.   albuterol 108 (90 Base) MCG/ACT inhaler Commonly known as:  PROVENTIL HFA Inhale 2 puffs into the lungs every 4 (four) hours as needed for wheezing or shortness of breath.   atorvastatin 20 MG tablet Commonly known as:   LIPITOR Take 1 tablet (20 mg total) by mouth daily at 6 PM.   cetirizine 10 MG tablet Commonly known as:  ZYRTEC TAKE 1/2 TABLET(5 MG) BY MOUTH AT BEDTIME   cholecalciferol 1000 units tablet Commonly known as:  VITAMIN D Take 1,000 Units by mouth daily.   clopidogrel 75 MG tablet Commonly known as:  PLAVIX Take 1 tablet (75 mg total) by mouth daily. Replaces:  aspirin EC 81 MG tablet   FLUoxetine 10 MG capsule Commonly known as:  PROZAC TAKE 1 CAPSULE(10 MG) BY MOUTH DAILY   hydrALAZINE 25 MG tablet Commonly known as:  APRESOLINE Take 1 tablet (25 mg total) by mouth 3 (three) times daily. What changed:  when to take this   ipratropium-albuterol 0.5-2.5 (3) MG/3ML Soln Commonly known as:  DUONEB Take 3 mLs by nebulization every 6 (six) hours as needed. What changed:  reasons to take this   omeprazole 20 MG tablet Commonly known as:  PRILOSEC OTC Take 20 mg by mouth daily as needed (Heartburn).   SPIRIVA HANDIHALER 18 MCG inhalation capsule Generic drug:  tiotropium INHALE CONTENTS OF 1 CAPSULE ONCE DAILY USING HANDIHALER         Diet and Activity recommendation: See Discharge Instructions above   Consults obtained -neurology   Major procedures and Radiology Reports - PLEASE review detailed and final reports for all details, in brief -   Mr Brain Wo Contrast  Result Date: 06/28/2017 CLINICAL DATA:  Altered mental status and slurred speech beginning this morning. Aphasic without motor deficits. History of hypertension. EXAM: MRI HEAD WITHOUT CONTRAST MRA HEAD WITHOUT CONTRAST TECHNIQUE: Multiplanar, multiecho pulse sequences of the brain and surrounding structures were obtained without intravenous contrast. Angiographic images of the head were obtained using MRA technique without contrast. COMPARISON:  CT HEAD Jun 28, 2017 FINDINGS: MRI HEAD FINDINGS-Multiple sequences are mildly motion degraded. INTRACRANIAL CONTENTS: No reduced diffusion to suggest acute ischemia.  No susceptibility artifact to suggest hemorrhage. The ventricles and sulci are normal for patient's age. Patchy supratentorial white matter FLAIR T2 hyperintensities compatible with mild to moderate chronic small vessel ischemic changes, normal for age. 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 Suspicious parenchymal signal, masses, mass effect. No abnormal extra-axial fluid collections. No extra-axial masses. VASCULAR: Normal major intracranial vascular flow voids present at skull base. SKULL AND UPPER CERVICAL SPINE: No abnormal sellar expansion. No suspicious calvarial bone marrow signal. Craniocervical junction maintained. SINUSES/ORBITS: RIGHT mastoid effusion. Trace paranasal sinus mucosal thickening.The included ocular globes and orbital contents are non-suspicious. OTHER: None. MRA HEAD FINDINGS-moderately motion degraded examination. ANTERIOR CIRCULATION: Normal flow related enhancement of the included cervical, petrous, cavernous and supraclinoid internal carotid arteries. Patent anterior communicating artery, 4 mm superiorly directed A-comm aneurysm. Severe stenosis RIGHT A2 origin versus motion artifact. Patent anterior and middle cerebral arteries, mild luminal irregularity seen with motion artifact or atherosclerosis. No large vessel occlusion, flow limiting stenosis. POSTERIOR CIRCULATION: Codominant vertebral arteries. Basilar artery is patent, with normal flow related enhancement of the main branch vessels. Patent posterior cerebral arteries, mild luminal irregularity  seen with motion artifact or atherosclerosis. No large vessel occlusion, flow limiting stenosis,  aneurysm. 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 Source images and MIP images were reviewed. IMPRESSION: MRI HEAD: 1. No acute intracranial process on this motion degraded examination. 2. Mild-to-moderate chronic small vessel ischemic changes. Old RIGHT basal ganglia lacunar infarct. MRA HEAD:  1. Moderately motion degraded examination. No emergent large vessel occlusion. 2. Severe stenosis versus motion artifact RIGHT A2 origin. 3. 4 mm A-comm aneurysm. Neuro-Interventional Radiology consultation is suggested to evaluate the appropriateness of potential treatment. Non-emergent evaluation can be arranged by calling [phone] during usual hours. Emergency evaluation can be requested by paging [phone]. Electronically Signed   By: Elon Alas M.D.   On: 06/28/2017 15:36   US Carotid Bilateral (at [facility] Only)  Result Date: 06/28/2017 CLINICAL DATA:  77 year old female with a history of stroke like symptoms. Cardiovascular risk factors include hypertension, known stroke/TIA EXAM: BILATERAL CAROTID DUPLEX ULTRASOUND TECHNIQUE: Pearline Cables scale imaging, color Doppler and duplex ultrasound were performed of bilateral carotid and vertebral arteries in the neck. COMPARISON:  None FINDINGS: Criteria: Quantification of carotid stenosis is based on velocity parameters that correlate the residual internal carotid diameter with NASCET-based stenosis levels, using the diameter of the distal internal carotid lumen as the denominator for stenosis measurement. The following velocity measurements were obtained: RIGHT ICA:  Systolic 61 cm/sec, Diastolic 32 cm/sec CCA:  69 cm/sec SYSTOLIC ICA/CCA RATIO:  0.9 ECA:  67 cm/sec LEFT ICA:  Systolic 65 cm/sec, Diastolic 31 cm/sec CCA:  67 cm/sec SYSTOLIC ICA/CCA RATIO:  1.0 ECA:  67 cm/sec Right Brachial SBP: Not acquired Left Brachial SBP: Not acquired RIGHT CAROTID ARTERY: No significant calcified disease of the right common carotid artery. Intermediate waveform maintained. Heterogeneous plaque without significant calcifications at the right carotid bifurcation. Low resistance waveform of the right ICA. No significant tortuosity. RIGHT VERTEBRAL ARTERY: Antegrade flow with low resistance waveform. LEFT CAROTID ARTERY: No significant calcified disease of the left  common carotid artery. Intermediate waveform maintained. Heterogeneous plaque at the left carotid bifurcation without significant calcifications. Low resistance waveform of the left ICA. LEFT VERTEBRAL ARTERY:  Antegrade flow with low resistance waveform. IMPRESSION: Color duplex indicates minimal heterogeneous plaque, with no hemodynamically significant stenosis by duplex criteria in the extracranial cerebrovascular circulation. Signed, Dulcy Fanny. Earleen Newport, DO [facility] Radiology Electronically Signed   By: Corrie Mckusick D.O.   On: 06/28/2017 17:17   Dg Chest Port 1 View  Result Date: 06/13/2017 CLINICAL DATA:  Difficulty breathing EXAM: PORTABLE CHEST 1 VIEW COMPARISON:  June 12, 2017 FINDINGS: There is mild atelectatic change in the right lower lobe. There is no edema or consolidation. Heart is upper normal size with pulmonary vascularity within normal limits. There is aortic atherosclerosis. No adenopathy. No bone lesions. IMPRESSION: Mild right lower lobe atelectatic change. No edema or consolidation. Heart upper normal in size. There is aortic atherosclerosis. Aortic Atherosclerosis (ICD10-I70.0). Electronically Signed   By: Lowella Grip III M.D.   On: 06/13/2017 07:46   Dg Chest Portable 1 View  Result Date: 06/12/2017 CLINICAL DATA:  Shortness of breath and cough EXAM: PORTABLE CHEST 1 VIEW COMPARISON:  May 26, 2017 FINDINGS: There is no appreciable edema or consolidation. There is slight bibasilar atelectasis. Heart size and pulmonary vascularity are normal. There is aortic atherosclerosis. No adenopathy. No bone lesions. IMPRESSION: Slight bibasilar atelectasis. No edema or consolidation. Stable cardiac silhouette. There is aortic atherosclerosis. Aortic Atherosclerosis (  ICD10-I70.0). Electronically Signed   By: Lowella Grip III M.D.   On: 06/12/2017 07:26   Mr Jodene Nam Head/brain RX Cm  Result Date: 06/28/2017 CLINICAL DATA:  Altered mental  status and slurred speech beginning this morning. Aphasic without motor deficits. History of hypertension. EXAM: MRI HEAD WITHOUT CONTRAST MRA HEAD WITHOUT CONTRAST TECHNIQUE: Multiplanar, multiecho pulse sequences of the brain and surrounding structures were obtained without intravenous contrast. Angiographic images of the head were obtained using MRA technique without contrast. COMPARISON:  CT HEAD Jun 28, 2017 FINDINGS: MRI HEAD FINDINGS-Multiple sequences are mildly motion degraded. INTRACRANIAL CONTENTS: No reduced diffusion to suggest acute ischemia. No susceptibility artifact to suggest hemorrhage. The ventricles and sulci are normal for patient's age. Patchy supratentorial white matter FLAIR T2 hyperintensities compatible with mild to moderate chronic small vessel ischemic changes, normal for age. 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 Suspicious parenchymal signal, masses, mass effect. No abnormal extra-axial fluid collections. No extra-axial masses. VASCULAR: Normal major intracranial vascular flow voids present at skull base. SKULL AND UPPER CERVICAL SPINE: No abnormal sellar expansion. No suspicious calvarial bone marrow signal. Craniocervical junction maintained. SINUSES/ORBITS: RIGHT mastoid effusion. Trace paranasal sinus mucosal thickening.The included ocular globes and orbital contents are non-suspicious. OTHER: None. MRA HEAD FINDINGS-moderately motion degraded examination. ANTERIOR CIRCULATION: Normal flow related enhancement of the included cervical, petrous, cavernous and supraclinoid internal carotid arteries. Patent anterior communicating artery, 4 mm superiorly directed A-comm aneurysm. Severe stenosis RIGHT A2 origin versus motion artifact. Patent anterior and middle cerebral arteries, mild luminal irregularity seen with motion artifact or atherosclerosis. No large vessel occlusion, flow limiting stenosis. POSTERIOR CIRCULATION: Codominant vertebral arteries. Basilar artery is patent, with  normal flow related enhancement of the main branch vessels. Patent posterior cerebral arteries, mild luminal irregularity seen with motion artifact or atherosclerosis. No large vessel occlusion, flow limiting stenosis,  aneurysm. 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 Source images and MIP images were reviewed. IMPRESSION: MRI HEAD: 1. No acute intracranial process on this motion degraded examination. 2. Mild-to-moderate chronic small vessel ischemic changes. Old RIGHT basal ganglia lacunar infarct. MRA HEAD: 1. Moderately motion degraded examination. No emergent large vessel occlusion. 2. Severe stenosis versus motion artifact RIGHT A2 origin. 3. 4 mm A-comm aneurysm. Neuro-Interventional Radiology consultation is suggested to evaluate the appropriateness of potential treatment. Non-emergent evaluation can be arranged by calling [phone] during usual hours. Emergency evaluation can be requested by paging [phone]. Electronically Signed   By: Elon Alas M.D.   On: 06/28/2017 15:36   Ct Head Code Stroke Wo Contrast  Addendum Date: 06/28/2017   ADDENDUM REPORT: 06/28/2017 12:27 ADDENDUM: Study discussed by telephone with Dr. Lenise Arena on 06/28/2017 [id number] hours. Electronically Signed   By: Genevie Ann M.D.   On: 06/28/2017 12:27   Result Date: 06/28/2017 CLINICAL DATA:  Code stroke. 77 year old female with abnormal speech beginning 4 hours ago. Expressive aphasia. EXAM: CT HEAD WITHOUT CONTRAST TECHNIQUE: Contiguous axial images were obtained from the base of the skull through the vertex without intravenous contrast. COMPARISON:  Head CT without contrast 07/25/2008. FINDINGS: Brain: Minimal heterogeneity at the right caudate nucleus is stable since 2010 and probably a perivascular space, but there is new mild white matter hypodensity at the anterior limb of the right internal capsule since 2010. No midline shift, ventriculomegaly, mass effect,  evidence of mass lesion, intracranial hemorrhage or evidence of cortically based acute infarction. No cortical encephalomalacia identified. Vascular: No suspicious intracranial vascular hyperdensity. Skull: No acute osseous abnormality identified.  Sinuses/Orbits: Stable and well pneumatized. Bilateral nasal cavity mucosal thickening and small volume retained secretions today. Mild right mastoid effusion. Other: Disconjugate gaze similar to the prior CT. No acute orbit or scalp soft tissue findings. ASPECTS [facility] Stroke Program Early CT Score) - Ganglionic level infarction (caudate, lentiform nuclei, internal capsule, insula, M1- M3 cortex): 7 - Supraganglionic infarction (M4-M6 cortex): 3 Total score (0-10 with 10 being normal): 10 IMPRESSION: 1. No acute cortically based infarct or acute intracranial hemorrhage identified. 2. ASPECTS is 10. 3. Mild for age cerebral white matter disease has progressed since 2010. Electronically Signed: By: Genevie Ann M.D. On: 06/28/2017 12:09    Micro Results     No results found for this or any previous visit (from the past 240 hour(s)).     Today   Subjective:   [name] today as well, stable for discharge.  Objective:   Blood pressure 108/67, pulse 94, temperature 98.9 F (37.2 C), temperature source Oral, resp. rate 20, height 5\' 1"  (1.549 m), weight 112.9 kg (249 lb), SpO2 98 %.   Intake/Output Summary (Last 24 hours) at 06/30/2017 1026 Last data filed at 06/30/2017 1020 Gross per 24 hour  Intake 840 ml  Output 600 ml  Net 240 ml   Is eager to go home, wants to go to [employer]. Exam Awake Alert, Oriented x 3, No new F.N deficits, Normal affect. [place].AT,PERRAL Supple Neck,No JVD, No cervical lymphadenopathy appriciated.  Symmetrical Chest wall movement, Good air movement bilaterally, CTAB RRR,No Gallops,Rubs or new Murmurs, No Parasternal Heave +ve B.Sounds, Abd Soft, Non tender, No organomegaly appriciated, No rebound -guarding or  rigidity. No Cyanosis, Clubbing or edema, No new Rash or bruise  Data Review   CBC w Diff:  Lab Results  Component Value Date   WBC 5.4 06/28/2017   HGB 12.6 06/28/2017   HGB 12.6 03/27/2017   HCT 36.8 06/28/2017   HCT 37.9 03/27/2017   PLT 181 06/28/2017   PLT 252 03/27/2017   LYMPHOPCT 28 06/28/2017   LYMPHOPCT 11.0 01/23/2013   MONOPCT 9 06/28/2017   MONOPCT 1.4 01/23/2013   EOSPCT 0 06/28/2017   EOSPCT 0.0 01/23/2013   BASOPCT 1 06/28/2017   BASOPCT 0.2 01/23/2013    CMP:  Lab Results  Component Value Date   NA 143 06/28/2017   NA 142 03/21/2017   NA 139 02/20/2013   K 4.8 06/28/2017   K 4.1 02/20/2013   CL 109 06/28/2017   CL 111 (H) 02/20/2013   CO2 27 06/28/2017   CO2 23 02/20/2013   BUN 21 (H) 06/28/2017   BUN 19 03/21/2017   BUN 17 02/20/2013   CREATININE 1.69 (H) 06/28/2017   CREATININE 1.58 (H) 02/20/2013   PROT 6.7 06/28/2017   PROT 6.7 03/21/2017   PROT 7.5 01/22/2013   ALBUMIN 3.4 (L) 06/28/2017   ALBUMIN 4.1 03/21/2017   ALBUMIN 3.5 01/22/2013   BILITOT 0.5 06/28/2017   BILITOT 0.2 03/21/2017   BILITOT 0.4 01/22/2013   ALKPHOS 67 06/28/2017   ALKPHOS 92 01/22/2013   AST 18 06/28/2017   AST 22 01/22/2013   ALT 17 06/28/2017   ALT 15 01/22/2013  .   Total Time in preparing paper work, data evaluation and todays exam - 35 minutes  Epifanio Lesches M.D on 06/30/2017 at 10:26 AM    Note: This dictation was prepared with Dragon dictation along with smaller phrase technology. Any transcriptional errors that result from this process are unintentional.

## 2017-06-30 NOTE — Care Management (Signed)
[facility] is [facility]. Please resume services at discharge.

## 2017-06-30 NOTE — Progress Notes (Signed)
Pt is being discharged home with [facility]. Discharge papers given and explained to pt and spouse, both verbalized understanding. Meds and f/u appointments reviewed. RX to be picked up from pharmacy. Pt is aware.

## 2017-06-30 NOTE — Care Management (Signed)
RNCM spoke with patient regarding transition to home today followed by [facility].  She was not aware that she was discharging however states that her husband will provide transportation to home. She states that "he is at church right now". She is aware that she has a discharge order.  I have notified Jermaine with [facility] of patient discharge today. No other RNCM needs.

## 2017-07-01 DIAGNOSIS — G4733 Obstructive sleep apnea (adult) (pediatric): Secondary | ICD-10-CM | POA: Diagnosis not present

## 2017-07-01 DIAGNOSIS — F329 Major depressive disorder, single episode, unspecified: Secondary | ICD-10-CM | POA: Diagnosis not present

## 2017-07-01 DIAGNOSIS — J439 Emphysema, unspecified: Secondary | ICD-10-CM | POA: Diagnosis not present

## 2017-07-01 DIAGNOSIS — N183 Chronic kidney disease, stage 3 (moderate): Secondary | ICD-10-CM | POA: Diagnosis not present

## 2017-07-01 DIAGNOSIS — F17210 Nicotine dependence, cigarettes, uncomplicated: Secondary | ICD-10-CM | POA: Diagnosis not present

## 2017-07-01 DIAGNOSIS — M1990 Unspecified osteoarthritis, unspecified site: Secondary | ICD-10-CM | POA: Diagnosis not present

## 2017-07-01 DIAGNOSIS — I2510 Atherosclerotic heart disease of native coronary artery without angina pectoris: Secondary | ICD-10-CM | POA: Diagnosis not present

## 2017-07-01 DIAGNOSIS — K219 Gastro-esophageal reflux disease without esophagitis: Secondary | ICD-10-CM | POA: Diagnosis not present

## 2017-07-01 DIAGNOSIS — I129 Hypertensive chronic kidney disease with stage 1 through stage 4 chronic kidney disease, or unspecified chronic kidney disease: Secondary | ICD-10-CM | POA: Diagnosis not present

## 2017-07-02 ENCOUNTER — Telehealth: Payer: Self-pay

## 2017-07-02 ENCOUNTER — Other Ambulatory Visit: Payer: Self-pay | Admitting: *Deleted

## 2017-07-02 ENCOUNTER — Telehealth: Payer: Self-pay | Admitting: Unknown Physician Specialty

## 2017-07-02 NOTE — Patient Outreach (Signed)
[facility]) Care Management  07/02/2017  Katherine [name] 04/15/1940 [id number]   Successful telephone encounter for Katherine [name], 77 year old female- follow up on recent observation stay May 16-18,2019 for Aphasia.  RN CM following pt for  Chronic disease management of COPD- 3 admits <30 days. Spoke with spouse Katherine [name] (on [facility] written consent) HIPAA identifiers provided on pt.  Spouse reports  Pt  is doing fine since discharge home, Cook RN came [place].  Spouse reports Backus was ordered for pt.   Spouse reports pt's breathing is fine so far, taking all Of  her medications, he continues to fix her medications.   RN CM unable to review pt's medications with spouse at this time as he is currently not at home.  Spouse reports pt to follow up with PCP 07/04/17.  Plan:  As discussed with spouse, to follow up again with pt on 07/06/17- home  Visit.   Katherine [name].   [facility]  [phone]

## 2017-07-02 NOTE — Telephone Encounter (Signed)
All orders OK

## 2017-07-02 NOTE — Telephone Encounter (Signed)
Transition Care Management Follow-Up Telephone Call   Date discharged and where: 06/30/17 from [facility]  How have you been since you were released from the hospital?  Pt was resting so I was unable to complete TCM call with her. However, pt caregiver, Bennie Hind, was able to provide history. States pt has been doing much better since discharge. Slurred speech appears to have improved.  Caregiver confirmed HH PT and nursing has come to home to complete visit.  Any patient concerns? None  Items Reviewed: Verified = V   Meds: V  Allergies: V  Dietary Orders: Heart Healthy aphasic diet. Caregiver verbalized acceptance and understanding about dietary limitations and restrictions.  Functional Questionnaire:  Independent = I Dependent = D  ADLs: I  Dressing- I   Eating- I  Maintaining continence- I  Transferring- I  Transportation- D  Meal Prep- I  Managing Meds- D  Additional Items Reviewed:  Hospital f/u appt confirmed? Yes. Scheduled to see [name], NP on 07/04/17 @ 10:45am. Caregiver denies need for transportation arrangements.  If their condition worsens, is the pt aware to call PCP or go to the Emergency Dept.? Yes. Caregiver verbalized acceptance and understanding  Was the patient provided with contact information for the PCP's office or ED? Yes, caregiver  Was to pt encouraged to call back with questions or concerns? Yes, caregiver

## 2017-07-02 NOTE — Telephone Encounter (Signed)
Copied from [place] [id number]. Topic: Quick Communication - See Telephone Encounter >> Jul 02, 2017 10:44 AM Ether Griffins B wrote: CRM for notification. See Telephone encounter for: 07/02/17.  Butch Penny, RN with [facility] requesting verbal order for resumption of care. Also the hospital put in a referral for OT but she would like that changed to Speech Therapy. CB# [id number]. They are hoping to get the ok today. They are not able to see the patient until they get the verbal order.

## 2017-07-02 NOTE — Telephone Encounter (Signed)
Routing to provider for verbal order. 

## 2017-07-02 NOTE — Telephone Encounter (Signed)
Called and let Butch Penny know that [place] gave the OK for verbal orders.

## 2017-07-03 ENCOUNTER — Telehealth: Payer: Self-pay | Admitting: Unknown Physician Specialty

## 2017-07-03 DIAGNOSIS — K219 Gastro-esophageal reflux disease without esophagitis: Secondary | ICD-10-CM | POA: Diagnosis not present

## 2017-07-03 DIAGNOSIS — F329 Major depressive disorder, single episode, unspecified: Secondary | ICD-10-CM | POA: Diagnosis not present

## 2017-07-03 DIAGNOSIS — F17210 Nicotine dependence, cigarettes, uncomplicated: Secondary | ICD-10-CM | POA: Diagnosis not present

## 2017-07-03 DIAGNOSIS — M1990 Unspecified osteoarthritis, unspecified site: Secondary | ICD-10-CM | POA: Diagnosis not present

## 2017-07-03 DIAGNOSIS — I2510 Atherosclerotic heart disease of native coronary artery without angina pectoris: Secondary | ICD-10-CM | POA: Diagnosis not present

## 2017-07-03 DIAGNOSIS — N183 Chronic kidney disease, stage 3 (moderate): Secondary | ICD-10-CM | POA: Diagnosis not present

## 2017-07-03 DIAGNOSIS — I129 Hypertensive chronic kidney disease with stage 1 through stage 4 chronic kidney disease, or unspecified chronic kidney disease: Secondary | ICD-10-CM | POA: Diagnosis not present

## 2017-07-03 DIAGNOSIS — G4733 Obstructive sleep apnea (adult) (pediatric): Secondary | ICD-10-CM | POA: Diagnosis not present

## 2017-07-03 DIAGNOSIS — J439 Emphysema, unspecified: Secondary | ICD-10-CM | POA: Diagnosis not present

## 2017-07-03 NOTE — Telephone Encounter (Signed)
Copied from [place] [id number]. Topic: Quick Communication - See Telephone Encounter >> Jul 03, 2017  4:09 PM Cleaster Corin, NT wrote: CRM for notification. See Telephone encounter for: 07/03/17.  Raquel Sarna calling from [facility] wanting an extension on PT for patient for 2 week 1 and 1 week 1. Raquel Sarna can be reached at [phone]

## 2017-07-04 ENCOUNTER — Ambulatory Visit (INDEPENDENT_AMBULATORY_CARE_PROVIDER_SITE_OTHER): Payer: Medicare HMO | Admitting: Unknown Physician Specialty

## 2017-07-04 ENCOUNTER — Encounter: Payer: Self-pay | Admitting: Unknown Physician Specialty

## 2017-07-04 VITALS — BP 121/84 | HR 106 | Temp 98.1°F | Ht 60.0 in | Wt 263.2 lb

## 2017-07-04 DIAGNOSIS — J320 Chronic maxillary sinusitis: Secondary | ICD-10-CM | POA: Diagnosis not present

## 2017-07-04 DIAGNOSIS — N183 Chronic kidney disease, stage 3 unspecified: Secondary | ICD-10-CM

## 2017-07-04 DIAGNOSIS — J42 Unspecified chronic bronchitis: Secondary | ICD-10-CM | POA: Diagnosis not present

## 2017-07-04 DIAGNOSIS — J449 Chronic obstructive pulmonary disease, unspecified: Secondary | ICD-10-CM

## 2017-07-04 DIAGNOSIS — Z7189 Other specified counseling: Secondary | ICD-10-CM

## 2017-07-04 DIAGNOSIS — Z66 Do not resuscitate: Secondary | ICD-10-CM

## 2017-07-04 DIAGNOSIS — J432 Centrilobular emphysema: Secondary | ICD-10-CM | POA: Diagnosis not present

## 2017-07-04 DIAGNOSIS — I119 Hypertensive heart disease without heart failure: Secondary | ICD-10-CM | POA: Diagnosis not present

## 2017-07-04 DIAGNOSIS — R4701 Aphasia: Secondary | ICD-10-CM | POA: Diagnosis not present

## 2017-07-04 MED ORDER — AMOXICILLIN-POT CLAVULANATE 875-125 MG PO TABS: 1.0000 | ORAL_TABLET | Freq: Two times a day (BID) | ORAL | 0 refills | Status: DC

## 2017-07-04 MED ORDER — IPRATROPIUM BROMIDE 0.03 % NA SOLN: 2.0000 | Freq: Two times a day (BID) | NASAL | 12 refills | Status: DC

## 2017-07-04 NOTE — Assessment & Plan Note (Signed)
Would like to keep DNR in place

## 2017-07-04 NOTE — Assessment & Plan Note (Addendum)
Mild improvement.  Unknown cause.  Speech therapy pending

## 2017-07-04 NOTE — Telephone Encounter (Signed)
LMOM

## 2017-07-04 NOTE — Telephone Encounter (Signed)
Emily notified.

## 2017-07-04 NOTE — Telephone Encounter (Signed)
OK for continued PT

## 2017-07-04 NOTE — Assessment & Plan Note (Signed)
Stable, continue present medications.   

## 2017-07-04 NOTE — Patient Instructions (Signed)
Take Mucinex 600 mg twice a day.  OTC.  Can find in liquid.  Ask pharmacist

## 2017-07-04 NOTE — Assessment & Plan Note (Signed)
GFR 33.  This is baseline for her

## 2017-07-04 NOTE — Assessment & Plan Note (Signed)
Goals of care discussion as above.  Further outreach with palliative care.

## 2017-07-04 NOTE — Progress Notes (Addendum)
BP 121/84   Pulse (!) 106   Temp 98.1 F (36.7 C) (Oral)   Ht 5' (1.524 m)   Wt 263 lb 3.2 oz (119.4 kg)   SpO2 94%   BMI 51.40 kg/m    Subjective:    Patient ID: Katherine [name], female    DOB: Mar 09, 1940, 77 y.o.   MRN: [id number]  HPI: Katherine [name] is a 77 y.o. female  Chief Complaint  Patient presents with  . Hospitalization Follow-up    at [facility] for TIA   Transition of [facility] Follow up.   Hospital/Facility:[facility] D/C Physician:  D/C Date: 06/30/2017  Discharge summary reviewed  Diagnoses on Discharge:  Aphasia unknown origin, CKD stable, Severe COPD baseline  Date of interactive Contact within 48 hours of discharge:  Contact was through: phone  Date of 7 day or 14 day face-to-face visit:   7 day  Outpatient Encounter Medications as of 07/04/2017  Medication Sig  . acetaminophen (TYLENOL) 650 MG CR tablet Take 650-1,300 mg by mouth every 8 (eight) hours as needed for pain.   [name] albuterol (PROVENTIL HFA) 108 (90 Base) MCG/ACT inhaler Inhale 2 puffs into the lungs every 4 (four) hours as needed for wheezing or shortness of breath.  . cholecalciferol (VITAMIN D) 1000 units tablet Take 1,000 Units by mouth daily.  . clopidogrel (PLAVIX) 75 MG tablet Take 1 tablet (75 mg total) by mouth daily.  . [DISCONTINUED] atorvastatin (LIPITOR) 20 MG tablet Take 1 tablet (20 mg total) by mouth daily at 6 PM.  . [DISCONTINUED] cetirizine (ZYRTEC) 10 MG tablet TAKE 1/2 TABLET(5 MG) BY MOUTH AT BEDTIME  . [DISCONTINUED] FLUoxetine (PROZAC) 10 MG capsule TAKE 1 CAPSULE(10 MG) BY MOUTH DAILY  . [DISCONTINUED] hydrALAZINE (APRESOLINE) 25 MG tablet Take 1 tablet (25 mg total) by mouth 3 (three) times daily. (Patient taking differently: Take 25 mg by mouth 2 (two) times daily. )  . [DISCONTINUED] ipratropium-albuterol (DUONEB) 0.5-2.5 (3) MG/3ML SOLN Take 3 mLs by nebulization every 6 (six) hours as needed. (Patient taking differently: Take 3 mLs by nebulization every 6 (six) hours as  needed (shortness of breath and/or wheezing). )  . [DISCONTINUED] omeprazole (PRILOSEC OTC) 20 MG tablet Take 20 mg by mouth daily as needed (Heartburn).   . [DISCONTINUED] SPIRIVA HANDIHALER 18 MCG inhalation capsule INHALE CONTENTS OF 1 CAPSULE ONCE DAILY USING HANDIHALER  . [DISCONTINUED] amoxicillin-clavulanate (AUGMENTIN) 875-125 MG tablet Take 1 tablet by mouth 2 (two) times daily.  . [DISCONTINUED] ipratropium (ATROVENT) 0.03 % nasal spray Place 2 sprays into both nostrils every 12 (twelve) hours.   No facility-administered encounter medications on file as of 07/04/2017.    Discharged from hospital 06/30/17 for aphasia.   MRI showed no stroke.  Echocardioram, cardiogram, and EEG were also normal. Working on a speech therapist coming out to the house.   Denies any trouble swallowing. Stopped ASA due to risk benefit.   Pt is here with husband and concerned that hospice is "calling all the time."  Having nose bleeds and mucous in her throat and difficulty clearing.  Husband is using Tussin.  Reports she drinks a lot of fluids.      Respiratory failure secondary to COPD.  Using O2 at 2 liters presently and at home.  Using Duonebs 2-3 times /day.  Also taking Spireva.    CKD GFR 33 which is her baseline   Diagnostic Tests Reviewed/Disposition: Today pt feels her speech has starting to clear.  Feels she is not struggling to breath  but concerned with nose bleeds and mucous in throat.    Consults: Neurology and cardiology  Discharge Instructions:  Home health nursing.    Disease/illness Education:  Home Health/Community Services Discussions/Referrals: Getting home health and speech therapy planning on coming  Assessment and Support of treatment regimen adherence: Husband and pt feel that at this time, they can manage health conditions at home.     Relevant past medical, surgical, family and social history reviewed and updated as indicated. Interim medical history since our last visit  reviewed. Allergies and medications reviewed and updated.  Review of Systems  Per HPI unless specifically indicated above     Objective:    BP 121/84   Pulse (!) 106   Temp 98.1 F (36.7 C) (Oral)   Ht 5' (1.524 m)   Wt 263 lb 3.2 oz (119.4 kg)   SpO2 94%   BMI 51.40 kg/m   Wt Readings from Last 3 Encounters:  08/09/17 270 lb (122.5 kg)  07/24/17 263 lb (119.3 kg)  07/07/17 263 lb 5.4 oz (119.4 kg)    Physical Exam  Constitutional: She is oriented to person, place, and time. She appears well-developed and well-nourished. No distress.  HENT:  Head: Normocephalic and atraumatic.  Eyes: Conjunctivae and lids are normal. Right eye exhibits no discharge. Left eye exhibits no discharge. No scleral icterus.  Neck: Normal range of motion. Neck supple. No JVD present. Carotid bruit is not present.  Cardiovascular: Normal rate, regular rhythm and normal heart sounds.  Pulmonary/Chest: Effort normal and breath sounds normal.  Abdominal: Normal appearance. There is no splenomegaly or hepatomegaly.  Musculoskeletal: Normal range of motion.  Neurological: She is alert and oriented to person, place, and time.  Expressive dysphagia noted.  Often hard to get words our.    Skin: Skin is warm, dry and intact. No rash noted. No pallor.  Psychiatric: She has a normal mood and affect. Her behavior is normal. Judgment and thought content normal.     Assessment & Plan:   Problem List Items Addressed This Visit      Unprioritized   Advanced care planning/counseling discussion    Goals of care discussion as above.  Further outreach with palliative care.        Aphasia    Mild improvement.  Unknown cause.  Speech therapy pending      CKD (chronic kidney disease), stage III (HCC)    GFR 33.  This is baseline for her      DNR (do not resuscitate)    Would like to keep DNR in place      Hypertensive heart disease    Stable, continue present medications.        Stage 4 very severe  COPD by GOLD classification ([place])    Severe.  Explained that COPD is a progressive illness.  Discussed referral to palliative care and purpose to prevent hospital readmission, help decide on treatments, and keep comfortable in home.  Husband not comfortable with hospice referral but accept palliative care.  Increase use of Duoneb to at least 4 times/day       Other Visit Diagnoses    Maxillary sinusitis, unspecified chronicity    -  Primary   thick mucous with blood.  Rule out sinusitis.  Rx for Augmentin.  Use Mucinex and Atrovent nasal spray       Follow up plan: Return in about 1 month (around 08/01/2017).

## 2017-07-04 NOTE — Assessment & Plan Note (Addendum)
Severe.  Explained that COPD is a progressive illness.  Discussed referral to palliative care and purpose to prevent hospital readmission, help decide on treatments, and keep comfortable in home.  Husband not comfortable with hospice referral but accept palliative care.  Increase use of Duoneb to at least 4 times/day

## 2017-07-05 ENCOUNTER — Telehealth: Payer: Self-pay | Admitting: Unknown Physician Specialty

## 2017-07-05 DIAGNOSIS — J439 Emphysema, unspecified: Secondary | ICD-10-CM | POA: Diagnosis not present

## 2017-07-05 DIAGNOSIS — F17210 Nicotine dependence, cigarettes, uncomplicated: Secondary | ICD-10-CM | POA: Diagnosis not present

## 2017-07-05 DIAGNOSIS — F329 Major depressive disorder, single episode, unspecified: Secondary | ICD-10-CM | POA: Diagnosis not present

## 2017-07-05 DIAGNOSIS — I2510 Atherosclerotic heart disease of native coronary artery without angina pectoris: Secondary | ICD-10-CM | POA: Diagnosis not present

## 2017-07-05 DIAGNOSIS — G4733 Obstructive sleep apnea (adult) (pediatric): Secondary | ICD-10-CM | POA: Diagnosis not present

## 2017-07-05 DIAGNOSIS — K219 Gastro-esophageal reflux disease without esophagitis: Secondary | ICD-10-CM | POA: Diagnosis not present

## 2017-07-05 DIAGNOSIS — I129 Hypertensive chronic kidney disease with stage 1 through stage 4 chronic kidney disease, or unspecified chronic kidney disease: Secondary | ICD-10-CM | POA: Diagnosis not present

## 2017-07-05 DIAGNOSIS — N183 Chronic kidney disease, stage 3 (moderate): Secondary | ICD-10-CM | POA: Diagnosis not present

## 2017-07-05 DIAGNOSIS — M1990 Unspecified osteoarthritis, unspecified site: Secondary | ICD-10-CM | POA: Diagnosis not present

## 2017-07-05 NOTE — Telephone Encounter (Signed)
Copied from [place] [id number]. Topic: General - Other >> Jul 05, 2017  3:09 PM Carolyn Stare wrote:  Amy with [facility] call to ask for verbal orders for Speech Therapy for 1 week 1 , 2 week 4  [phone]

## 2017-07-06 ENCOUNTER — Other Ambulatory Visit: Payer: Self-pay | Admitting: *Deleted

## 2017-07-06 DIAGNOSIS — K219 Gastro-esophageal reflux disease without esophagitis: Secondary | ICD-10-CM | POA: Diagnosis not present

## 2017-07-06 DIAGNOSIS — F17210 Nicotine dependence, cigarettes, uncomplicated: Secondary | ICD-10-CM | POA: Diagnosis not present

## 2017-07-06 DIAGNOSIS — F329 Major depressive disorder, single episode, unspecified: Secondary | ICD-10-CM | POA: Diagnosis not present

## 2017-07-06 DIAGNOSIS — I2510 Atherosclerotic heart disease of native coronary artery without angina pectoris: Secondary | ICD-10-CM | POA: Diagnosis not present

## 2017-07-06 DIAGNOSIS — G4733 Obstructive sleep apnea (adult) (pediatric): Secondary | ICD-10-CM | POA: Diagnosis not present

## 2017-07-06 DIAGNOSIS — M1990 Unspecified osteoarthritis, unspecified site: Secondary | ICD-10-CM | POA: Diagnosis not present

## 2017-07-06 DIAGNOSIS — I129 Hypertensive chronic kidney disease with stage 1 through stage 4 chronic kidney disease, or unspecified chronic kidney disease: Secondary | ICD-10-CM | POA: Diagnosis not present

## 2017-07-06 DIAGNOSIS — J439 Emphysema, unspecified: Secondary | ICD-10-CM | POA: Diagnosis not present

## 2017-07-06 DIAGNOSIS — N183 Chronic kidney disease, stage 3 (moderate): Secondary | ICD-10-CM | POA: Diagnosis not present

## 2017-07-06 NOTE — Patient Outreach (Signed)
[facility]) Care Management   07/06/2017  Katherine [name] 08/14/1940 [id number]  Katherine [name] is an 77 y.o. female  Subjective:  Pt reports weakness is getting better since hospital discharge, walking more HH PT to come today.   Pt reports with recent hospitalization (Aphasia) words don't come  Out at times but is better, [place] came yesterday.  Pt reports still has sob with exertion,  Will rest if needed.  Pt reports on recent visit with PCP (view in EMR on 5/22 for sinusitis) Was given an antibiotic, secretions not always coming up, mouth dry.   Objective:   Vitals:   07/06/17 1025 07/06/17 1104  BP: (!) 142/96 118/90  Pulse: 95   Resp: 19   SpO2: 97%     ROS  Physical Exam  Constitutional: She is oriented to person, place, and time. She appears well-developed and well-nourished.  Cardiovascular: Normal rate, regular rhythm and normal heart sounds.  Respiratory: Effort normal.  All fields clear with exception of posterior right lower lobe- congestion noted on expiration.    GI: Soft.  Musculoskeletal: Normal range of motion.  Neurological: She is alert and oriented to person, place, and time.  Skin: Skin is warm and dry.  Psychiatric: She has a normal mood and affect. Her behavior is normal. Judgment and thought content normal.    Encounter Medications:   Outpatient Encounter Medications as of 07/06/2017  Medication Sig  . acetaminophen (TYLENOL) 650 MG CR tablet Take 650-1,300 mg by mouth every 8 (eight) hours as needed for pain.   [name] albuterol (PROVENTIL HFA) 108 (90 Base) MCG/ACT inhaler Inhale 2 puffs into the lungs every 4 (four) hours as needed for wheezing or shortness of breath.  [name] amoxicillin-clavulanate (AUGMENTIN) 875-125 MG tablet Take 1 tablet by mouth 2 (two) times daily.  [name] atorvastatin (LIPITOR) 20 MG tablet Take 1 tablet (20 mg total) by mouth daily at 6 PM.  . cetirizine (ZYRTEC) 10 MG tablet TAKE 1/2 TABLET(5 MG) BY MOUTH AT BEDTIME  .  cholecalciferol (VITAMIN D) 1000 units tablet Take 1,000 Units by mouth daily.  . clopidogrel (PLAVIX) 75 MG tablet Take 1 tablet (75 mg total) by mouth daily.  [name] FLUoxetine (PROZAC) 10 MG capsule TAKE 1 CAPSULE(10 MG) BY MOUTH DAILY  . hydrALAZINE (APRESOLINE) 25 MG tablet Take 1 tablet (25 mg total) by mouth 3 (three) times daily. (Patient taking differently: Take 25 mg by mouth 2 (two) times daily. )  . ipratropium (ATROVENT) 0.03 % nasal spray Place 2 sprays into both nostrils every 12 (twelve) hours.  [name] ipratropium-albuterol (DUONEB) 0.5-2.5 (3) MG/3ML SOLN Take 3 mLs by nebulization every 6 (six) hours as needed. (Patient taking differently: Take 3 mLs by nebulization every 6 (six) hours as needed (shortness of breath and/or wheezing). )  . omeprazole (PRILOSEC OTC) 20 MG tablet Take 20 mg by mouth daily as needed (Heartburn).   . SPIRIVA HANDIHALER 18 MCG inhalation capsule INHALE CONTENTS OF 1 CAPSULE ONCE DAILY USING HANDIHALER   No facility-administered encounter medications on file as of 07/06/2017.     Functional Status:   In your present state of health, do you have any difficulty performing the following activities: 06/28/2017 06/12/2017  Hearing? Y N  Vision? N N  Difficulty concentrating or making decisions? N N  Walking or climbing stairs? Y Y  Dressing or bathing? Y Y  Doing errands, shopping? Y Y  Preparing Food and eating ? - -  Using the Toilet? - -  In the past six months, have you accidently leaked urine? - -  Do you have problems with loss of bowel control? - -  Managing your Medications? - -  Managing your Finances? - -  Housekeeping or managing your Housekeeping? - -  Some recent data might be hidden    Fall/Depression Screening:    Fall Risk  06/05/2017 03/21/2017 09/06/2016  Falls in the past year? No No No   PHQ 2/9 Scores 06/05/2017 03/21/2017 09/12/2016 09/06/2016 06/12/2016 11/10/2015 09/07/2015  PHQ - 2 Score 0 1 0 0 2 0 0  PHQ- 9 Score - 3 6 - 7 - 3     Assessment:  Co visit done with coworker Katherine Fountain RN CM.   Pleasant 77 year old female, resides with spouse- very supportive/manages her medications.   Pt had a recent hospitalization 5/16-5/18/19 for Aphasia.   Risk for readmit- 6 admits in 6 months.  Pt's history includes but not limited to Hypertensive Heart disease, GERD, Stage 4 very severe COPD/Gold, CKD, Aphasia.    COPD:  O 2 sat at rest with deep breaths 97 % on 2 liters [place].       During home visit, with pt's permission Katherine Fountain RN CM called pt's [place]      Requesting humidifier bottle for her oxygen tank, per Huey Romans last one deliverd in 2017 to         Which pt reports was never right.  Aphasia:  Noted pt at times having problem getting her words out right.  Hypertension:  BP today 142/96 RA, recheck 118/90 RA.  Almost time for pt to take next dose of      Hydralazine.          Plan:  As discussed with pt, this RN CM to transfer her case to coworker Katherine Fountain RN CM     (present during home visit).   [name] relayed to follow up again in 2 weeks telephonically.     Pt reports spouse has [facility]'s contact number to call if needs arise.    [facility] CM Care Plan Problem One     Most Recent Value  Care Plan Problem One  re established COPD- frequent admissions 3<30 days  Role Documenting the Problem One  Care Management Coordinator  Care Plan for Problem One  Active  [facility] Long Term Goal   re established -pt's knowledge of  self management of COPD would improve in the next 19 days   [facility] Long Term Goal Start Date  07/06/17 [date reset due to recent hospitalization ]  Interventions for Problem One Long Term Goal  Reviewed with pt COPD yellow zone/when to call MD, Truddie Crumble RN CM discussed importance of purse lip breathing/called [employer] requested a humidifier bottle for pt's O2 concentrator.   [facility] CM Short Term Goal #1   re established pt would take all medications as ordered for the next 30 days   [facility] CM Short Term Goal  #1 Start Date  07/06/17 [date reset due to recent hospitalization ]  Interventions for Short Term Goal #1  Discussed with pt ongoing medication adherence, reinforced calling [facility] RN if Pulmonary medication protocol needed.   [facility] CM Short Term Goal #2   Pt would call PCP office to schedule follow up appointment within the next 6 days   [facility] CM Short Term Goal #2 Start Date  05/31/17  [facility] CM Short Term Goal #2 Met Date  06/05/17  [facility] CM Short Term  Goal #3  re established - pt would keep all MD appointments in the next 30 days   [facility] CM Short Term Goal #3 Start Date  07/06/17 [date reset due to recent hospitalization ]  Interventions for Short Tern Goal #3  Discussed with pt recent PCP visit post discharge, per pt to f/u again next week      Katherine Cossey M.   [facility]  [phone]

## 2017-07-06 NOTE — Telephone Encounter (Signed)
Called and let Amy know what Malachy Mood said. She states that she does not feel that it is an issue at this point, just wanted the background story on the situation. States she will call if she feels like the issue needs to be addressed further.

## 2017-07-06 NOTE — Telephone Encounter (Signed)
Orders approved.

## 2017-07-06 NOTE — Telephone Encounter (Signed)
Called and let Amy know that Malachy Mood gave the OK for verbal orders. 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 Amy wanted to know if the patient has been referred to ENT and if the patient has had surgery on her throat before. 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 Do not see a ENT referral. Malachy Mood is this information correct and do you know any further information as to what the patient may be talking about?

## 2017-07-06 NOTE — Telephone Encounter (Signed)
Routing to provider  

## 2017-07-06 NOTE — Telephone Encounter (Signed)
Yes, that seems correct.  I have not put in a ENT referral but willing to if she feels that would be helpful.  I think her primary problem is the aphasia which would not be related to the polyp

## 2017-07-07 ENCOUNTER — Emergency Department: Payer: Medicare HMO

## 2017-07-07 ENCOUNTER — Emergency Department
Admission: EM | Admit: 2017-07-07 | Discharge: 2017-07-07 | Disposition: A | Discharge status: Home / Self Care | Payer: Medicare HMO | Attending: Emergency Medicine | Admitting: Emergency Medicine

## 2017-07-07 DIAGNOSIS — Z79899 Other long term (current) drug therapy: Secondary | ICD-10-CM | POA: Diagnosis not present

## 2017-07-07 DIAGNOSIS — Z87891 Personal history of nicotine dependence: Secondary | ICD-10-CM | POA: Insufficient documentation

## 2017-07-07 DIAGNOSIS — I129 Hypertensive chronic kidney disease with stage 1 through stage 4 chronic kidney disease, or unspecified chronic kidney disease: Secondary | ICD-10-CM | POA: Diagnosis not present

## 2017-07-07 DIAGNOSIS — N183 Chronic kidney disease, stage 3 (moderate): Secondary | ICD-10-CM | POA: Diagnosis not present

## 2017-07-07 DIAGNOSIS — J449 Chronic obstructive pulmonary disease, unspecified: Secondary | ICD-10-CM | POA: Insufficient documentation

## 2017-07-07 DIAGNOSIS — I69320 Aphasia following cerebral infarction: Secondary | ICD-10-CM | POA: Insufficient documentation

## 2017-07-07 DIAGNOSIS — Z7901 Long term (current) use of anticoagulants: Secondary | ICD-10-CM | POA: Diagnosis not present

## 2017-07-07 DIAGNOSIS — R0602 Shortness of breath: Secondary | ICD-10-CM | POA: Diagnosis not present

## 2017-07-07 DIAGNOSIS — Z96651 Presence of right artificial knee joint: Secondary | ICD-10-CM | POA: Diagnosis not present

## 2017-07-07 DIAGNOSIS — Z9981 Dependence on supplemental oxygen: Secondary | ICD-10-CM | POA: Insufficient documentation

## 2017-07-07 DIAGNOSIS — J439 Emphysema, unspecified: Secondary | ICD-10-CM | POA: Diagnosis not present

## 2017-07-07 DIAGNOSIS — R0789 Other chest pain: Secondary | ICD-10-CM | POA: Diagnosis not present

## 2017-07-07 LAB — COMPREHENSIVE METABOLIC PANEL
ALBUMIN: 3.3 g/dL — AB (ref 3.5–5.0)
ALT: 13 U/L — ABNORMAL LOW (ref 14–54)
ANION GAP: 4 — AB (ref 5–15)
AST: 22 U/L (ref 15–41)
Alkaline Phosphatase: 64 U/L (ref 38–126)
BILIRUBIN TOTAL: 0.5 mg/dL (ref 0.3–1.2)
BUN: 20 mg/dL (ref 6–20)
CO2: 30 mmol/L (ref 22–32)
Calcium: 8.8 mg/dL — ABNORMAL LOW (ref 8.9–10.3)
Chloride: 107 mmol/L (ref 101–111)
Creatinine, Ser: 1.74 mg/dL — ABNORMAL HIGH (ref 0.44–1.00)
GFR calc Af Amer: 32 mL/min — ABNORMAL LOW (ref >60)
GFR, EST NON AFRICAN AMERICAN: 27 mL/min — AB (ref >60)
Glucose, Bld: 155 mg/dL — ABNORMAL HIGH (ref 65–99)
POTASSIUM: 4.6 mmol/L (ref 3.5–5.1)
Sodium: 141 mmol/L (ref 135–145)
TOTAL PROTEIN: 6.5 g/dL (ref 6.5–8.1)

## 2017-07-07 LAB — CBC WITH DIFFERENTIAL/PLATELET
BASOS PCT: 3 %
Basophils Absolute: 0.2 10*3/uL — ABNORMAL HIGH (ref 0–0.1)
EOS ABS: 0.1 10*3/uL (ref 0–0.7)
Eosinophils Relative: 2 %
HEMATOCRIT: 35.3 % (ref 35.0–47.0)
Hemoglobin: 11.5 g/dL — ABNORMAL LOW (ref 12.0–16.0)
Lymphocytes Relative: 23 %
Lymphs Abs: 1.1 10*3/uL (ref 1.0–3.6)
MCH: 31.2 pg (ref 26.0–34.0)
MCHC: 32.5 g/dL (ref 32.0–36.0)
MCV: 95.9 fL (ref 80.0–100.0)
MONO ABS: 0.4 10*3/uL (ref 0.2–0.9)
Monocytes Relative: 9 %
NEUTROS ABS: 2.9 10*3/uL (ref 1.4–6.5)
Neutrophils Relative %: 63 %
Platelets: 197 10*3/uL (ref 150–440)
RBC: 3.68 MIL/uL — ABNORMAL LOW (ref 3.80–5.20)
RDW: 15.7 % — AB (ref 11.5–14.5)
WBC: 4.7 10*3/uL (ref 3.6–11.0)

## 2017-07-07 LAB — URINALYSIS, COMPLETE (UACMP) WITH MICROSCOPIC
Bacteria, UA: NONE SEEN
Bilirubin Urine: NEGATIVE
Glucose, UA: NEGATIVE mg/dL
HGB URINE DIPSTICK: NEGATIVE
Ketones, ur: NEGATIVE mg/dL
LEUKOCYTES UA: NEGATIVE
Nitrite: NEGATIVE
PROTEIN: NEGATIVE mg/dL
Specific Gravity, Urine: 1.011 (ref 1.005–1.030)
pH: 5 (ref 5.0–8.0)

## 2017-07-07 LAB — TROPONIN I

## 2017-07-07 LAB — PROTIME-INR
INR: 0.84
PROTHROMBIN TIME: 11.4 s (ref 11.4–15.2)

## 2017-07-07 LAB — LIPASE, BLOOD: LIPASE: 32 U/L (ref 11–51)

## 2017-07-07 LAB — ETHANOL

## 2017-07-07 MED ORDER — IPRATROPIUM-ALBUTEROL 0.5-2.5 (3) MG/3ML IN SOLN
3.0000 mL | Freq: Once | RESPIRATORY_TRACT | Status: AC
  Administered 2017-07-07: 3 mL via RESPIRATORY_TRACT
  Filled 2017-07-07: qty 3

## 2017-07-07 MED ORDER — OXYCODONE-ACETAMINOPHEN 5-325 MG PO TABS
1.0000 | ORAL_TABLET | Freq: Once | ORAL | Status: AC
  Administered 2017-07-07: 1 via ORAL
  Filled 2017-07-07: qty 1

## 2017-07-07 MED ORDER — FENTANYL CITRATE (PF) 100 MCG/2ML IJ SOLN
50.0000 ug | Freq: Once | INTRAMUSCULAR | Status: AC
  Administered 2017-07-07: 50 ug via INTRAVENOUS
  Filled 2017-07-07: qty 2

## 2017-07-07 NOTE — ED Notes (Signed)
Pt discharged to home.  Family member driving.  Discharge instructions reviewed.  Verbalized understanding.  No questions or concerns at this time.  Teach back verified.  Pt in NAD.  No items left in ED.   

## 2017-07-07 NOTE — ED Triage Notes (Signed)
Pt presents today via [facility] from home for Resp Distress. Pt is not able to speak more than 1 full sentence. Pt had CVA post 1 week. Pt is Alert and oriented. Pt presents with labored breathing. Pt has HX of COPD. Pt received 1 neb tx in-transit.

## 2017-07-07 NOTE — Discharge Instructions (Signed)
Return to the emergency room for any new or worrisome symptoms including shortness of breath, chest pain, fever, severe cough, weakness, or you feel worse in any way.  Follow closely with primary care doctor.

## 2017-07-07 NOTE — ED Notes (Signed)
Pt placed on bedpan, able to roll from side to side and lift hips.

## 2017-07-07 NOTE — ED Provider Notes (Addendum)
[facility] Emergency Department Provider Note  ____________________________________________   I have reviewed the triage vital signs and the nursing notes. Where available I have reviewed prior notes and, if possible and indicated, outside hospital notes.    HISTORY  Chief Complaint Respiratory Distress    HPI Katherine [name] is a 77 y.o. 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  Is somewhat difficult history as the patient does have a significant excessive aphasia, however she states that she has been having pain all over every part of her body since around 1030 which began gradually.  She was doing okay yesterday.  She cannot tell me any alleviating or aggravating symptoms, she denies any fever or chills.  She states she is chronically short of breath, she is not sure if she is more short of breath.  She has had some cough.  She denies any particular pain in her chest except for the fact that her chest is a component of her body and she has pain all over her entire body.  This does include arms and legs.  She denies any focal numbness or weakness, denies any change in her pre-existing expressive aphasia...  The patient denies fever or diarrhea vomiting.  She does not have abdominal pain in particular although she has pain in general.  Very difficult history, very limited information forthcoming from patient.  Level 5 chart caveat; no further history available due to patient status.  Past Medical History:  Diagnosis Date  . Angioedema   . Anxiety   . Anxiety and depression   . Chronic constipation   . Chronic kidney disease    stage 3  . COPD (chronic obstructive pulmonary disease) ([place])   . Gallstones   . GERD (gastroesophageal reflux disease)   . Hypertension   . Left ventricular hypertrophy   .  Osteoarthritis   . Prurigo nodularis   . Tobacco abuse   . Vitamin D deficiency disease     Patient Active Problem List   Diagnosis Date Noted  . Aphasia 06/28/2017  . Acute on chronic respiratory failure with hypoxia ([place]) 06/12/2017  . Hand pain 03/21/2017  . Dry skin 03/21/2017  . Pain in finger of left hand 09/12/2016  . Chronic fatigue 06/12/2016  . Left knee pain 06/12/2016  . Advanced care planning/counseling discussion 03/13/2016  . DNR (do not resuscitate) 03/13/2016  . Primary localized osteoarthritis of right knee 02/08/2016  . OSA and COPD overlap syndrome ([place]) 09/16/2015  . Rotator cuff syndrome 09/07/2015  . 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 Sleep disturbance 04/14/2015  . Coronary artery disease 03/14/2015  . 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 Anxiety   . Tobacco abuse   . Prurigo nodularis   . Hypertensive heart disease   . GERD (gastroesophageal reflux disease)   . Stage 4 very severe COPD by GOLD classification ([place])   . Osteoarthritis   . Vitamin D deficiency disease   . Chronic constipation   . CKD (chronic kidney disease), stage III ([place])   . LVH (left ventricular hypertrophy) 05/20/2013  . Morbid obesity ([place]) 05/20/2013    Past Surgical History:  Procedure Laterality Date  . CHOLECYSTECTOMY    . 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   Procedure: TOTAL  KNEE ARTHROPLASTY;  Surgeon: Hessie Knows, MD;  Location: [facility] ORS;  Service: Orthopedics;  Laterality: Right;    Prior to Admission medications   Medication Sig Start Date End Date Taking? Authorizing Provider  acetaminophen (TYLENOL) 650 MG CR tablet Take 650-1,300 mg by mouth every 8 (eight) hours as needed for pain.     [provider]  albuterol (PROVENTIL HFA) 108 (90 Base) MCG/ACT inhaler Inhale 2 puffs into the lungs every 4 (four) hours as needed for wheezing or shortness of breath. 05/30/17   Loletha Grayer, MD  amoxicillin-clavulanate (AUGMENTIN) 875-125 MG tablet Take 1 tablet by mouth 2 (two) times daily. 07/04/17   [name], NP  atorvastatin (LIPITOR) 20 MG tablet Take 1 tablet (20 mg total) by mouth daily at 6 PM. 06/30/17   Epifanio Lesches, MD  cetirizine (ZYRTEC) 10 MG tablet TAKE 1/2 TABLET(5 MG) BY MOUTH AT BEDTIME 06/25/17   [name], NP  cholecalciferol (VITAMIN D) 1000 units tablet Take 1,000 Units by mouth daily.    [provider]  clopidogrel (PLAVIX) 75 MG tablet Take 1 tablet (75 mg total) by mouth daily. 06/30/17   Epifanio Lesches, MD  FLUoxetine (PROZAC) 10 MG capsule TAKE 1 CAPSULE(10 MG) BY MOUTH DAILY 01/30/17   [name], NP  hydrALAZINE (APRESOLINE) 25 MG tablet Take 1 tablet (25 mg total) by mouth 3 (three) times daily. 05/30/17   Loletha Grayer, MD  ipratropium (ATROVENT) 0.03 % nasal spray Place 2 sprays into both nostrils every 12 (twelve) hours. 07/04/17   [name], NP  ipratropium-albuterol (DUONEB) 0.5-2.5 (3) MG/3ML SOLN Take 3 mLs by nebulization every 6 (six) hours as needed. Patient taking differently: Take 3 mLs by nebulization every 6 (six) hours as needed (shortness of breath and/or wheezing).  11/29/15   [name], MD  omeprazole (PRILOSEC OTC) 20 MG tablet Take 20 mg by mouth daily as needed (Heartburn).     [provider]  SPIRIVA HANDIHALER 18 MCG inhalation capsule INHALE CONTENTS OF 1 CAPSULE ONCE DAILY USING HANDIHALER 06/25/17   [name], NP    Allergies Bee venom; Enalapril maleate; and Other  Family History  Problem Relation Age of Onset  . Alcohol abuse Mother   . Sickle cell anemia Daughter   . Hypertension Son   . Cancer Neg Hx   . COPD Neg Hx   . Diabetes Neg Hx   . Heart disease Neg Hx   . Stroke Neg Hx     Social History Social History   Tobacco Use  . Smoking status: Former Smoker    Packs/day: 0.50    Years: 60.00    Pack years: 30.00    Types: Cigarettes  .  Smokeless tobacco: Never Used  Substance Use Topics  . Alcohol use: No    Alcohol/week: 0.0 oz    Comment: rare  . Drug use: No    Review of Systems Constitutional: No fever/chills Eyes: No visual changes. ENT: No sore throat. No stiff neck no neck pain Cardiovascular: Denies chest pain. Respiratory: Denies shortness of breath. Gastrointestinal:   no vomiting.  No diarrhea.  No constipation. Genitourinary: Negative for dysuria. Musculoskeletal: Negative lower extremity swelling Skin: Negative for rash. Neurological: Negative for severe headaches, focal weakness or numbness.   ____________________________________________   PHYSICAL EXAM:  VITAL SIGNS: ED Triage Vitals  Enc Vitals Group     BP 07/07/17 1556 (!) 117/100     Pulse Rate 07/07/17 1556 (!) 112     Resp --  Temp 07/07/17 1556 98.4 F (36.9 C)     Temp Source 07/07/17 1556 Oral     SpO2 07/07/17 1555 90 %     Weight 07/07/17 1557 263 lb 5.4 oz (119.4 kg)     Height 07/07/17 1557 5' (1.524 m)     Head Circumference --      Peak Flow --      Pain Score 07/07/17 1556 5     Pain Loc --      Pain Edu? --      Excl. in [place]? --     Constitutional: Alert and oriented.  She is anxious and upset and somewhat tearful Eyes: Conjunctivae are normal Head: Atraumatic HEENT: No congestion/rhinnorhea. Mucous membranes are moist.  Oropharynx non-erythematous Neck:   Nontender with no meningismus, no masses, no stridor Cardiovascular: Normal rate, regular rhythm. Grossly normal heart sounds.  Good peripheral circulation. Respiratory: Somewhat diminished in the bases, patient does not take a good deep breath for me. Abdominal: Soft and nontender. No distention. No guarding no rebound Back:  There is no focal tenderness or step off.  there is no midline tenderness there are no lesions noted. there is no CVA tenderness Musculoskeletal: No lower extremity tenderness, no upper extremity tenderness. No joint effusions, no DVT  signs strong distal pulses no edema Neurologic: No obvious focal deficits noted aside from baseline expressive aphasia, very limited parts patient exam from who is very very anxious and upset. Skin:  Skin is warm, dry and intact. No rash noted. Psychiatric: Mood and affect are normal. Speech and behavior are normal.  ____________________________________________   LABS (all labs ordered are listed, but only abnormal results are displayed)  Labs Reviewed  COMPREHENSIVE METABOLIC PANEL  ETHANOL  TROPONIN I  CBC WITH DIFFERENTIAL/PLATELET  LIPASE, BLOOD  PROTIME-INR  URINALYSIS, COMPLETE (UACMP) WITH MICROSCOPIC    Pertinent labs  results that were available during my care of the patient were reviewed by me and considered in my medical decision making (see chart for details). ____________________________________________  EKG  I personally interpreted any EKGs ordered by me or triage Sinus tach rate 107 no acute ST elevation or depression, nonspecific ST changes no acute ischemia ____________________________________________  RADIOLOGY  Pertinent labs & imaging results that were available during my care of the patient were reviewed by me and considered in my medical decision making (see chart for details). If possible, patient and/or family made aware of any abnormal findings.  No results found. ____________________________________________    PROCEDURES  Procedure(s) performed: None  Procedures  Critical Care performed: None  ____________________________________________   INITIAL IMPRESSION / ASSESSMENT AND PLAN / ED COURSE  Pertinent labs & imaging results that were available during my care of the patient were reviewed by me and considered in my medical decision making (see chart for details).  Patient here with entire body pain for some reason that I cannot quite determine.  Certainly quite anxious.  We will give her some fentanyl as a pain relief and also perhaps as  a mild anxiolytic while we try to figure out exactly what is going on. '  ----------------------------------------- 4:56 PM on 07/07/2017 -----------------------------------------  Patient in no acute distress at this time, much more comfortable, she is now stating that she does not have pain everywhere she has a "knot" in her chest wall in the left side which is tender.  No fever or chills.  The pain is been there since this morning, is worse when she touches it or  changes position.  Her husband is in the room states that this began while she was lying in bed with him.  Patient was he thinks very anxious upon arrival she is much calmer now her heart rate reflects it.  When I touch her chest, she has an area underneath her left breast which is tender but not erythematous, there is no crepitus or flail chest, I touch that she states "ouch that the pain right there" and pulls back.  Patient therefore is at this time here for very reproducible chest wall discomfort.  She is much calmer at this time, respiratory rate in the low 20s and she is stating that she would like something else for this discomfort.  We will send 2 sets of cardiac markers, given renal insufficiency, I do not think CT scan is in her best interest and more likely to cause harm than good.  Very low suspicion for ACS PE or dissection for very reproducible chest wall pain which somehow changed into pain "everywhere".  We will continue to observe her here in the emergency department, this is somewhat unusual presentation tinged certainly by some degree of anxiety but we will take it as seriously as it warrants and certainly do 2 sets of cardiac markers if possible.  ----------------------------------------- 6:38 PM on 07/07/2017 -----------------------------------------  Lying after pulling the bed waiting second set of cardiac enzymes, she only has pain when I touch her chest wall she states her when she changes position the wrong way.   Very reproducible discomfort, will see if the second set of negative visit is I is my hope that we can get her safely home it is her strong preference to go home  ----------------------------------------- 9:12 PM on 07/07/2017 -----------------------------------------  Patient CT scan does not show any evidence of pneumonia or other pathology, obvious I cannot give her contrast to die but I do not think she has a PE.  She is sometimes quite anxious while she is here, but she would very strongly prefer to go home.  Even extensive work-up with no obvious pathology indicated, and her preference to go home we will discharge her with close outpatient follow-up.  Return precautions and follow-up of been given and understood.   ____________________________________________   FINAL CLINICAL IMPRESSION(S) / ED DIAGNOSES  Final diagnoses:  SOB (shortness of breath)      This chart was dictated using voice recognition software.  Despite best efforts to proofread,  errors can occur which can change meaning.      Schuyler Amor, MD 07/07/17 1621    Schuyler Amor, MD 07/07/17 1658    Schuyler Amor, MD 07/07/17 1839    Schuyler Amor, MD 07/07/17 2112

## 2017-07-09 DIAGNOSIS — R079 Chest pain, unspecified: Secondary | ICD-10-CM | POA: Diagnosis not present

## 2017-07-09 DIAGNOSIS — J9611 Chronic respiratory failure with hypoxia: Secondary | ICD-10-CM | POA: Diagnosis not present

## 2017-07-09 DIAGNOSIS — N183 Chronic kidney disease, stage 3 (moderate): Secondary | ICD-10-CM | POA: Diagnosis not present

## 2017-07-09 DIAGNOSIS — J441 Chronic obstructive pulmonary disease with (acute) exacerbation: Secondary | ICD-10-CM | POA: Diagnosis not present

## 2017-07-09 DIAGNOSIS — I272 Pulmonary hypertension, unspecified: Secondary | ICD-10-CM | POA: Diagnosis not present

## 2017-07-09 DIAGNOSIS — R918 Other nonspecific abnormal finding of lung field: Secondary | ICD-10-CM | POA: Diagnosis not present

## 2017-07-09 DIAGNOSIS — R000 Tachycardia, unspecified: Secondary | ICD-10-CM | POA: Diagnosis not present

## 2017-07-09 DIAGNOSIS — J9621 Acute and chronic respiratory failure with hypoxia: Secondary | ICD-10-CM | POA: Diagnosis not present

## 2017-07-09 DIAGNOSIS — E875 Hyperkalemia: Secondary | ICD-10-CM | POA: Diagnosis not present

## 2017-07-09 DIAGNOSIS — F0390 Unspecified dementia without behavioral disturbance: Secondary | ICD-10-CM | POA: Diagnosis not present

## 2017-07-09 DIAGNOSIS — J9612 Chronic respiratory failure with hypercapnia: Secondary | ICD-10-CM | POA: Diagnosis not present

## 2017-07-09 DIAGNOSIS — I69322 Dysarthria following cerebral infarction: Secondary | ICD-10-CM | POA: Diagnosis not present

## 2017-07-09 DIAGNOSIS — E662 Morbid (severe) obesity with alveolar hypoventilation: Secondary | ICD-10-CM | POA: Diagnosis not present

## 2017-07-09 DIAGNOSIS — I69320 Aphasia following cerebral infarction: Secondary | ICD-10-CM | POA: Diagnosis not present

## 2017-07-09 DIAGNOSIS — I129 Hypertensive chronic kidney disease with stage 1 through stage 4 chronic kidney disease, or unspecified chronic kidney disease: Secondary | ICD-10-CM | POA: Diagnosis not present

## 2017-07-09 DIAGNOSIS — R0789 Other chest pain: Secondary | ICD-10-CM | POA: Diagnosis not present

## 2017-07-09 DIAGNOSIS — Z6841 Body Mass Index (BMI) 40.0 and over, adult: Secondary | ICD-10-CM | POA: Diagnosis not present

## 2017-07-09 DIAGNOSIS — J449 Chronic obstructive pulmonary disease, unspecified: Secondary | ICD-10-CM | POA: Diagnosis not present

## 2017-07-10 MED ORDER — PANTOPRAZOLE SODIUM 20 MG PO TBEC
40.00 | DELAYED_RELEASE_TABLET | ORAL | Status: DC
Start: 2017-07-14 — End: 2017-07-10

## 2017-07-10 MED ORDER — LIDOCAINE 5 % EX PTCH
1.00 | MEDICATED_PATCH | CUTANEOUS | Status: DC
Start: 2017-07-13 — End: 2017-07-10

## 2017-07-10 MED ORDER — POLYETHYLENE GLYCOL 3350 17 G PO PACK
17.00 | PACK | ORAL | Status: DC
Start: 2017-07-13 — End: 2017-07-10

## 2017-07-10 MED ORDER — AZITHROMYCIN 500 MG PO TABS
500.00 | ORAL_TABLET | ORAL | Status: DC
Start: 2017-07-10 — End: 2017-07-10

## 2017-07-10 MED ORDER — IPRATROPIUM BROMIDE 0.02 % IN SOLN
500.00 | RESPIRATORY_TRACT | Status: DC
Start: 2017-07-13 — End: 2017-07-10

## 2017-07-10 MED ORDER — CLOPIDOGREL BISULFATE 75 MG PO TABS
75.00 | ORAL_TABLET | ORAL | Status: DC
Start: 2017-07-14 — End: 2017-07-10

## 2017-07-10 MED ORDER — HYDRALAZINE HCL 50 MG PO TABS
25.00 | ORAL_TABLET | ORAL | Status: DC
Start: 2017-07-13 — End: 2017-07-10

## 2017-07-10 MED ORDER — HEPARIN SODIUM (PORCINE) 5000 UNIT/ML IJ SOLN
5000.00 | INTRAMUSCULAR | Status: DC
Start: 2017-07-10 — End: 2017-07-10

## 2017-07-10 MED ORDER — ALBUTEROL SULFATE (2.5 MG/3ML) 0.083% IN NEBU
2.50 | INHALATION_SOLUTION | RESPIRATORY_TRACT | Status: DC
Start: 2017-07-13 — End: 2017-07-10

## 2017-07-10 MED ORDER — GENERIC EXTERNAL MEDICATION
60.00 | Status: DC
Start: 2017-07-13 — End: 2017-07-10

## 2017-07-10 MED ORDER — ATORVASTATIN CALCIUM 20 MG PO TABS
20.00 | ORAL_TABLET | ORAL | Status: DC
Start: 2017-07-13 — End: 2017-07-10

## 2017-07-10 MED ORDER — ACETAMINOPHEN 325 MG PO TABS
650.00 | ORAL_TABLET | ORAL | Status: DC
Start: ? — End: 2017-07-10

## 2017-07-12 ENCOUNTER — Other Ambulatory Visit: Payer: Self-pay | Admitting: *Deleted

## 2017-07-12 MED ORDER — IPRATROPIUM BROMIDE 0.02 % IN SOLN
500.00 | RESPIRATORY_TRACT | Status: DC
Start: ? — End: 2017-07-12

## 2017-07-12 MED ORDER — ALBUTEROL SULFATE (2.5 MG/3ML) 0.083% IN NEBU
2.50 | INHALATION_SOLUTION | RESPIRATORY_TRACT | Status: DC
Start: ? — End: 2017-07-12

## 2017-07-12 MED ORDER — GENERIC EXTERNAL MEDICATION
2.00 | Status: DC
Start: 2017-07-13 — End: 2017-07-12

## 2017-07-12 MED ORDER — ONDANSETRON 4 MG PO TBDP
4.00 | ORAL_TABLET | ORAL | Status: DC
Start: ? — End: 2017-07-12

## 2017-07-12 NOTE — Patient Outreach (Signed)
[name] Care Management  07/12/2017  Katherine [name] 12-17-1940 [id number]   Pt discussed during [facility] difficult case discussion today.  RN CM followed pt for  Chronic disease management of COPD/high risk for hospital readmission/6 admits  In 6 months with view in EMR today pt currently at [facility] for SOB,  Acute on chronic respiratory failure with hypoxia/admitted 07/09/17 (7th admission) Pt  also had a recent ED visit on 5/25 at [facility] for sob, chest wall pain, relayed view in EMR CT scan done/showed no pneumonia, pt's preference to go home/to discharge with close outpatient follow up/ to schedule appointment with PCP in 2  days (07/09/17) but admitted that day at [facility].  Relayed home visit done 5/24  With coworker Katherine [name]- pt has [facility]  Protocol/part of of [facility] in her home.  Also view in EMR 07/04/17 Visit with PCP- palliative care was discussed with spouse and pt/accepted/ Consult sent that day. Pt confirmed with RN CM on 5/24 visit still a DNR, did not  Hear from  palliative care services/ spouse not  present during home visit to ask, manages her care/ does her medications.    This RN CM has transferred pt's case to coworker Katherine Fountain RN CM.    Plan:  RN CM to provide update to Katherine Fountain RN CM  on today's difficult case  discussion.   Katherine [name].   [facility]  [phone]

## 2017-07-16 ENCOUNTER — Other Ambulatory Visit: Payer: Self-pay

## 2017-07-16 ENCOUNTER — Telehealth: Payer: Self-pay | Admitting: Unknown Physician Specialty

## 2017-07-16 NOTE — Telephone Encounter (Signed)
Copied from [place] [id number]. Topic: Quick Communication - See Telephone Encounter >> Jul 16, 2017  3:59 PM Nils Flack wrote: CRM for notification. See Telephone encounter for: 07/16/17. Would like orders to resume home health,  Cb is [phone] [facility]  Call before 5 today or tomorrow after 8 am

## 2017-07-16 NOTE — Telephone Encounter (Signed)
Orders given.  

## 2017-07-16 NOTE — Patient Outreach (Addendum)
[facility]) Care Management  07/16/2017   Katherine [name] 08-15-1940 [id number]   Care Coordination Note:  Subjective: Successful post hospitalization telephone outreach encounter for Katherine [name]. Patient was hospitalized with [facility] from 07/09/17-5/31-19 for acute on chronic respiratory failure. RN CM following for chronic disease mgmt of COPD with 4 admit within 30 days. Spoke with spouse [name]. HIPPA identifiers provided however patient was also available on spouses speaker phone. Patient states she is doing much better. Feels she is back to baseline. Medication reconciliation reveals medication changes. Husband states patient has received all newly prescribed medications and taking as directed. Husband states [facility] has not yet contacted patient or husband regarding continuation of care with RN, OT, or PT services since patient has been discharged although it was ordered in discharge instructions. RN CM also concerned as patient has labs ordered to be drawn today and reported to MD. Husband instructed to call [facility] ASAP and follow up with date and time of their return to home and explain instruction for labs to be obtained today. Husband and patient able to verbalize newly prescribed low potassium diet and foods to avoid. Husband frustrated that he is always instructed to take wife to ED when he calls PCP with patients symptoms of shortness of breath. RN CM directed patient to COPD green, yellow, red, action plan card and spouse able to locate card in patients room. Also reinforced use of [facility] COPD protocol and to discuss this with [facility] RN when they are in the home. Patients plans are to follow up with [facility] family medicine on 6/6, Nephrologist on 6/12, and Pulmonologist for full PFTs and hospital follow-up on 08/09/17. Informed spouse that if patient changes from a [facility] PCP to a non-[facility] provider, patient would no longer be eligible for [facility] services. Husband and patient verbalized understanding. RN  CM clarified that this in no way changed any services they received with palliative care or HH.    Current Medications:  Current Outpatient Medications  Medication Sig Dispense Refill  . acetaminophen (TYLENOL) 650 MG CR tablet Take 650-1,300 mg by mouth every 8 (eight) hours as needed for pain.     [name] albuterol (PROVENTIL HFA) 108 (90 Base) MCG/ACT inhaler Inhale 2 puffs into the lungs every 4 (four) hours as needed for wheezing or shortness of breath. 1 Inhaler 0  . atorvastatin (LIPITOR) 20 MG tablet Take 1 tablet (20 mg total) by mouth daily at 6 PM. 30 tablet 0  . cetirizine (ZYRTEC) 10 MG tablet TAKE 1/2 TABLET(5 MG) BY MOUTH AT BEDTIME 15 tablet 0  . cholecalciferol (VITAMIN D) 1000 units tablet Take 1,000 Units by mouth daily.    . clopidogrel (PLAVIX) 75 MG tablet Take 1 tablet (75 mg total) by mouth daily. 30 tablet 0  . FLUoxetine (PROZAC) 10 MG capsule TAKE 1 CAPSULE(10 MG) BY MOUTH DAILY 90 capsule 0  . Fluticasone-Umeclidin-Vilant (TRELEGY ELLIPTA) 100-62.5-25 MCG/INH AEPB Inhale into the lungs.    . hydrALAZINE (APRESOLINE) 25 MG tablet Take 25 mg by mouth 3 (three) times daily.     [name] omeprazole (PRILOSEC) 40 MG capsule Take by mouth.    . polyethylene glycol (MIRALAX / GLYCOLAX) packet Take by mouth.    . senna (SENOKOT) 8.6 MG tablet Take 2 tablets by mouth at bedtime as needed for constipation.    [name] albuterol (ACCUNEB) 0.63 MG/3ML nebulizer solution Inhale into the lungs.     No current facility-administered medications for this visit.     Functional Status:  In your present state of health, do you have any difficulty performing the following activities: 06/28/2017 06/12/2017  Hearing? Y N  Vision? N N  Difficulty concentrating or making decisions? N N  Walking or climbing stairs? Y Y  Dressing or bathing? Y Y  Doing errands, shopping? Y Y  Preparing Food and eating ? - -  Using the Toilet? - -  In the past six months, have you accidently leaked urine? - -  Do you  have problems with loss of bowel control? - -  Managing your Medications? - -  Managing your Finances? - -  Housekeeping or managing your Housekeeping? - -  Some recent data might be hidden    Fall/Depression Screening: Fall Risk  07/16/2017 06/05/2017 03/21/2017  Falls in the past year? No No No  Risk for fall due to : Impaired balance/gait;Impaired mobility - -    Plan: Follow up with patient in 7-10 days to assure transition to new [place] PCP and proceed with [facility] case closure as patient will no longer be eligible for services.  Kandise Riehle E. Rollene Rotunda RN, BSN [facility] Care Management Coordinator [phone]

## 2017-07-17 NOTE — Telephone Encounter (Signed)
Only saw that this was sent to me, can I call these orders in?

## 2017-07-17 NOTE — Telephone Encounter (Signed)
Tried calling in verbal order to [facility]. Rip Harbour was not available so they faxed for the order to be faxed to [phone].  Order written, signed by Malachy Mood, and faxed as requested.

## 2017-07-19 DIAGNOSIS — Z87891 Personal history of nicotine dependence: Secondary | ICD-10-CM | POA: Diagnosis not present

## 2017-07-19 DIAGNOSIS — Z79899 Other long term (current) drug therapy: Secondary | ICD-10-CM | POA: Diagnosis not present

## 2017-07-19 DIAGNOSIS — Z7902 Long term (current) use of antithrombotics/antiplatelets: Secondary | ICD-10-CM | POA: Diagnosis not present

## 2017-07-19 DIAGNOSIS — J449 Chronic obstructive pulmonary disease, unspecified: Secondary | ICD-10-CM | POA: Diagnosis not present

## 2017-07-19 DIAGNOSIS — I129 Hypertensive chronic kidney disease with stage 1 through stage 4 chronic kidney disease, or unspecified chronic kidney disease: Secondary | ICD-10-CM | POA: Diagnosis not present

## 2017-07-19 DIAGNOSIS — R600 Localized edema: Secondary | ICD-10-CM | POA: Diagnosis not present

## 2017-07-19 DIAGNOSIS — Z8673 Personal history of transient ischemic attack (TIA), and cerebral infarction without residual deficits: Secondary | ICD-10-CM | POA: Diagnosis not present

## 2017-07-19 DIAGNOSIS — N183 Chronic kidney disease, stage 3 (moderate): Secondary | ICD-10-CM | POA: Diagnosis not present

## 2017-07-23 ENCOUNTER — Other Ambulatory Visit: Payer: Self-pay | Admitting: Pulmonary Disease

## 2017-07-23 ENCOUNTER — Telehealth: Payer: Self-pay | Admitting: Unknown Physician Specialty

## 2017-07-23 ENCOUNTER — Telehealth: Payer: Self-pay | Admitting: Pulmonary Disease

## 2017-07-23 ENCOUNTER — Other Ambulatory Visit: Payer: Self-pay

## 2017-07-23 DIAGNOSIS — J9611 Chronic respiratory failure with hypoxia: Secondary | ICD-10-CM | POA: Diagnosis not present

## 2017-07-23 DIAGNOSIS — I270 Primary pulmonary hypertension: Secondary | ICD-10-CM | POA: Diagnosis not present

## 2017-07-23 DIAGNOSIS — I129 Hypertensive chronic kidney disease with stage 1 through stage 4 chronic kidney disease, or unspecified chronic kidney disease: Secondary | ICD-10-CM | POA: Diagnosis not present

## 2017-07-23 DIAGNOSIS — I2510 Atherosclerotic heart disease of native coronary artery without angina pectoris: Secondary | ICD-10-CM | POA: Diagnosis not present

## 2017-07-23 DIAGNOSIS — E875 Hyperkalemia: Secondary | ICD-10-CM | POA: Diagnosis not present

## 2017-07-23 DIAGNOSIS — N183 Chronic kidney disease, stage 3 (moderate): Secondary | ICD-10-CM | POA: Diagnosis not present

## 2017-07-23 DIAGNOSIS — I69320 Aphasia following cerebral infarction: Secondary | ICD-10-CM | POA: Diagnosis not present

## 2017-07-23 DIAGNOSIS — J9612 Chronic respiratory failure with hypercapnia: Secondary | ICD-10-CM | POA: Diagnosis not present

## 2017-07-23 DIAGNOSIS — J441 Chronic obstructive pulmonary disease with (acute) exacerbation: Secondary | ICD-10-CM | POA: Diagnosis not present

## 2017-07-23 MED ORDER — IPRATROPIUM-ALBUTEROL 0.5-2.5 (3) MG/3ML IN SOLN: 3.0000 mL | Freq: Four times a day (QID) | RESPIRATORY_TRACT | 1 refills | Status: DC | PRN

## 2017-07-23 MED ORDER — ALBUTEROL SULFATE 0.63 MG/3ML IN NEBU: 1.0000 | INHALATION_SOLUTION | Freq: Three times a day (TID) | RESPIRATORY_TRACT | 1 refills | Status: DC | PRN

## 2017-07-23 NOTE — Telephone Encounter (Signed)
Copied from [place] [id number]. Topic: Quick Communication - See Telephone Encounter >> Jul 23, 2017  3:15 PM Neva Seat wrote: [facility] w/ [facility] [phone]  Verbal approval on a plan of care for pt. 3 times a week for 2 weeks 2 times a week for 2 weeks 1 time a week for 5 weeks

## 2017-07-23 NOTE — Patient Outreach (Signed)
[facility]) Care Management  07/23/2017   Katherine [name] [birth year] [id number] Care Coordination/Case Closure- Subjective: Follow up care coordination call by RN CM to establish if patient followed through with new [facility] PCP appointment on 07/19/17. Spoke with husband Katherine [name]. HIPAA identifiers verified. Per husband patient presented to appointment however was unable to complete related to MD concern over lower extremity edema. Patient sent to [facility] ED for lower extremity dopplers. Per husband, [name] on 07/30/17 to complete post hospitalization assessment. Husband verbalized he plans to cancel appointment with Katherine [name] on 08/22/17 and notify her office of transition to [facility] for primary care. As RN CM discussed with patient and husband at last contact, patient will be discharged today from [facility] services as she is no longer eligible. Husband verbalized understanding.    Current Medications:  Current Outpatient Medications  Medication Sig Dispense Refill  . acetaminophen (TYLENOL) 650 MG CR tablet Take 650-1,300 mg by mouth every 8 (eight) hours as needed for pain.     [name] albuterol (ACCUNEB) 0.63 MG/3ML nebulizer solution Inhale into the lungs.    [name] albuterol (PROVENTIL HFA) 108 (90 Base) MCG/ACT inhaler Inhale 2 puffs into the lungs every 4 (four) hours as needed for wheezing or shortness of breath. 1 Inhaler 0  . atorvastatin (LIPITOR) 20 MG tablet Take 1 tablet (20 mg total) by mouth daily at 6 PM. 30 tablet 0  . cetirizine (ZYRTEC) 10 MG tablet TAKE 1/2 TABLET(5 MG) BY MOUTH AT BEDTIME 15 tablet 0  . cholecalciferol (VITAMIN D) 1000 units tablet Take 1,000 Units by mouth daily.    . clopidogrel (PLAVIX) 75 MG tablet Take 1 tablet (75 mg total) by mouth daily. 30 tablet 0  . FLUoxetine (PROZAC) 10 MG capsule TAKE 1 CAPSULE(10 MG) BY MOUTH DAILY 90 capsule 0  . Fluticasone-Umeclidin-Vilant (TRELEGY ELLIPTA) 100-62.5-25 MCG/INH AEPB Inhale  into the lungs.    . hydrALAZINE (APRESOLINE) 25 MG tablet Take 25 mg by mouth 3 (three) times daily.     [name] omeprazole (PRILOSEC) 40 MG capsule Take by mouth.    . polyethylene glycol (MIRALAX / GLYCOLAX) packet Take by mouth.    . promethazine (PHENERGAN) 12.5 MG tablet Take 12.5 mg by mouth every 6 (six) hours as needed.    . senna (SENOKOT) 8.6 MG tablet Take 2 tablets by mouth at bedtime as needed for constipation.     No current facility-administered medications for this visit.     Plan: Case Closure  Katherine [name] E. Rollene Rotunda RN, BSN [facility] Care Management Coordinator [phone]

## 2017-07-23 NOTE — Telephone Encounter (Signed)
Approval given

## 2017-07-23 NOTE — Telephone Encounter (Signed)
Called and let Misty know that Bartolo gave the OK for verbal orders.

## 2017-07-23 NOTE — Telephone Encounter (Signed)
Returned call to [facility] with [facility] and made aware duoneb and albuterol have both been sent to pharmacy. Pt has f/u on 08/09/17. Nothing further needed.

## 2017-07-23 NOTE — Telephone Encounter (Signed)
°*  STAT* If patient is at the pharmacy, call can be transferred to refill team.   1. Which medications need to be refilled? (please list name of each medication and dose if known) Nebulizer medication   2. Which pharmacy/location (including street and city if local pharmacy) is medication to be sent to? [facility] in [place]   3. Do they need a 30 day or 90 day supply? 90 day

## 2017-07-24 ENCOUNTER — Other Ambulatory Visit: Payer: Self-pay | Admitting: Unknown Physician Specialty

## 2017-07-24 ENCOUNTER — Emergency Department: Payer: Medicare HMO

## 2017-07-24 ENCOUNTER — Encounter: Payer: Self-pay | Admitting: Emergency Medicine

## 2017-07-24 ENCOUNTER — Other Ambulatory Visit: Payer: Self-pay

## 2017-07-24 ENCOUNTER — Ambulatory Visit: Payer: Self-pay

## 2017-07-24 ENCOUNTER — Emergency Department
Admission: EM | Admit: 2017-07-24 | Discharge: 2017-07-24 | Discharge status: Against Medical Advice | Payer: Medicare HMO | Attending: Emergency Medicine | Admitting: Emergency Medicine

## 2017-07-24 DIAGNOSIS — J811 Chronic pulmonary edema: Secondary | ICD-10-CM | POA: Diagnosis not present

## 2017-07-24 DIAGNOSIS — Z87891 Personal history of nicotine dependence: Secondary | ICD-10-CM | POA: Diagnosis not present

## 2017-07-24 DIAGNOSIS — J441 Chronic obstructive pulmonary disease with (acute) exacerbation: Secondary | ICD-10-CM | POA: Insufficient documentation

## 2017-07-24 DIAGNOSIS — I129 Hypertensive chronic kidney disease with stage 1 through stage 4 chronic kidney disease, or unspecified chronic kidney disease: Secondary | ICD-10-CM | POA: Diagnosis not present

## 2017-07-24 DIAGNOSIS — J9611 Chronic respiratory failure with hypoxia: Secondary | ICD-10-CM | POA: Diagnosis not present

## 2017-07-24 DIAGNOSIS — Z79899 Other long term (current) drug therapy: Secondary | ICD-10-CM | POA: Diagnosis not present

## 2017-07-24 DIAGNOSIS — J9612 Chronic respiratory failure with hypercapnia: Secondary | ICD-10-CM | POA: Diagnosis not present

## 2017-07-24 DIAGNOSIS — I130 Hypertensive heart and chronic kidney disease with heart failure and stage 1 through stage 4 chronic kidney disease, or unspecified chronic kidney disease: Secondary | ICD-10-CM | POA: Diagnosis not present

## 2017-07-24 DIAGNOSIS — Z6841 Body Mass Index (BMI) 40.0 and over, adult: Secondary | ICD-10-CM | POA: Diagnosis not present

## 2017-07-24 DIAGNOSIS — J449 Chronic obstructive pulmonary disease, unspecified: Secondary | ICD-10-CM | POA: Diagnosis not present

## 2017-07-24 DIAGNOSIS — R079 Chest pain, unspecified: Secondary | ICD-10-CM | POA: Diagnosis not present

## 2017-07-24 DIAGNOSIS — I5032 Chronic diastolic (congestive) heart failure: Secondary | ICD-10-CM | POA: Diagnosis not present

## 2017-07-24 DIAGNOSIS — Z96651 Presence of right artificial knee joint: Secondary | ICD-10-CM | POA: Diagnosis not present

## 2017-07-24 DIAGNOSIS — N183 Chronic kidney disease, stage 3 (moderate): Secondary | ICD-10-CM | POA: Insufficient documentation

## 2017-07-24 DIAGNOSIS — I491 Atrial premature depolarization: Secondary | ICD-10-CM | POA: Diagnosis not present

## 2017-07-24 DIAGNOSIS — R0602 Shortness of breath: Secondary | ICD-10-CM | POA: Diagnosis not present

## 2017-07-24 DIAGNOSIS — I2510 Atherosclerotic heart disease of native coronary artery without angina pectoris: Secondary | ICD-10-CM | POA: Diagnosis not present

## 2017-07-24 DIAGNOSIS — R1110 Vomiting, unspecified: Secondary | ICD-10-CM | POA: Diagnosis not present

## 2017-07-24 DIAGNOSIS — R0600 Dyspnea, unspecified: Secondary | ICD-10-CM | POA: Diagnosis not present

## 2017-07-24 DIAGNOSIS — R0789 Other chest pain: Secondary | ICD-10-CM | POA: Diagnosis not present

## 2017-07-24 LAB — CBC
HCT: 35.2 % (ref 35.0–47.0)
HEMOGLOBIN: 11.3 g/dL — AB (ref 12.0–16.0)
MCH: 30.4 pg (ref 26.0–34.0)
MCHC: 32.3 g/dL (ref 32.0–36.0)
MCV: 94.3 fL (ref 80.0–100.0)
Platelets: 219 10*3/uL (ref 150–440)
RBC: 3.73 MIL/uL — AB (ref 3.80–5.20)
RDW: 15.9 % — ABNORMAL HIGH (ref 11.5–14.5)
WBC: 6.5 10*3/uL (ref 3.6–11.0)

## 2017-07-24 LAB — BASIC METABOLIC PANEL
ANION GAP: 10 (ref 5–15)
BUN: 19 mg/dL (ref 6–20)
CALCIUM: 9.4 mg/dL (ref 8.9–10.3)
CHLORIDE: 105 mmol/L (ref 101–111)
CO2: 27 mmol/L (ref 22–32)
Creatinine, Ser: 1.68 mg/dL — ABNORMAL HIGH (ref 0.44–1.00)
GFR calc non Af Amer: 29 mL/min — ABNORMAL LOW (ref >60)
GFR, EST AFRICAN AMERICAN: 33 mL/min — AB (ref >60)
GLUCOSE: 138 mg/dL — AB (ref 65–99)
Potassium: 4.5 mmol/L (ref 3.5–5.1)
Sodium: 142 mmol/L (ref 135–145)

## 2017-07-24 LAB — TROPONIN I: Troponin I: <0.03 ng/mL (ref <0.03)

## 2017-07-24 MED ORDER — IPRATROPIUM-ALBUTEROL 0.5-2.5 (3) MG/3ML IN SOLN
3.0000 mL | Freq: Once | RESPIRATORY_TRACT | Status: AC
  Administered 2017-07-24: 3 mL via RESPIRATORY_TRACT

## 2017-07-24 MED ORDER — IPRATROPIUM-ALBUTEROL 0.5-2.5 (3) MG/3ML IN SOLN
RESPIRATORY_TRACT | Status: AC
  Filled 2017-07-24: qty 3

## 2017-07-24 MED ORDER — IPRATROPIUM-ALBUTEROL 0.5-2.5 (3) MG/3ML IN SOLN
RESPIRATORY_TRACT | Status: AC
  Administered 2017-07-24: 3 mL via RESPIRATORY_TRACT
  Filled 2017-07-24: qty 6

## 2017-07-24 MED ORDER — METHYLPREDNISOLONE SODIUM SUCC 125 MG IJ SOLR
125.0000 mg | Freq: Once | INTRAMUSCULAR | Status: AC
  Administered 2017-07-24: 125 mg via INTRAVENOUS
  Filled 2017-07-24: qty 2

## 2017-07-24 NOTE — ED Provider Notes (Addendum)
[facility] Emergency Department Provider Note  Time seen: 2:53 PM  I have reviewed the triage vital signs and the nursing notes.   HISTORY  Chief Complaint Chest Pain and Shortness of Breath    HPI Katherine [name] is a 77 y.o. female with a past medical history of anxiety, CKD, COPD, gastric reflux, hypertension, presents to the emergency department for difficulty.  According to the patient since last night she has been feeling short of breath with wheeze, with mild to moderate chest tightness.  Patient wears 2 L of oxygen 24/7.  Denies any abdominal pain, nausea vomiting.  Denies fever.  Largely negative review of systems.   Past Medical History:  Diagnosis Date  . Angioedema   . Anxiety   . Anxiety and depression   . Chronic constipation   . Chronic kidney disease    stage 3  . COPD (chronic obstructive pulmonary disease) ([place])   . Gallstones   . GERD (gastroesophageal reflux disease)   . Hypertension   . Left ventricular hypertrophy   . Osteoarthritis   . Prurigo nodularis   . Tobacco abuse   . Vitamin D deficiency disease     Patient Active Problem List   Diagnosis Date Noted  . Aphasia 06/28/2017  . Acute on chronic respiratory failure with hypoxia ([place]) 06/12/2017  . Hand pain 03/21/2017  . Dry skin 03/21/2017  . Pain in finger of left hand 09/12/2016  . Chronic fatigue 06/12/2016  . Left knee pain 06/12/2016  . Advanced care planning/counseling discussion 03/13/2016  . DNR (do not resuscitate) 03/13/2016  . Primary localized osteoarthritis of right knee 02/08/2016  . OSA and COPD overlap syndrome ([place]) 09/16/2015  . Rotator cuff syndrome 09/07/2015  . 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 Sleep disturbance 04/14/2015  . Coronary artery disease 03/14/2015  . 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 Anxiety   . Tobacco abuse   . Prurigo nodularis   . Hypertensive heart disease   . GERD (gastroesophageal  reflux disease)   . Stage 4 very severe COPD by GOLD classification ([place])   . Osteoarthritis   . Vitamin D deficiency disease   . Chronic constipation   . CKD (chronic kidney disease), stage III ([place])   . LVH (left ventricular hypertrophy) 05/20/2013  . Morbid obesity ([place]) 05/20/2013    Past Surgical History:  Procedure Laterality Date  . CHOLECYSTECTOMY    . 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   Procedure: TOTAL KNEE ARTHROPLASTY;  Surgeon: Hessie Knows, MD;  Location: [facility] ORS;  Service: Orthopedics;  Laterality: Right;    Prior to Admission medications   Medication Sig Start Date End Date Taking? Authorizing Provider  acetaminophen (TYLENOL) 650 MG CR tablet Take 650-1,300 mg by mouth every 8 (eight) hours as needed for pain.     [provider]  albuterol (ACCUNEB) 0.63 MG/3ML nebulizer solution Take 3 mLs (0.63 mg total) by nebulization 3 (three) times daily as needed for wheezing. DX:J44.9 07/23/17   [name], MD  albuterol (PROVENTIL HFA) 108 (90 Base) MCG/ACT inhaler Inhale 2 puffs into the lungs every 4 (four) hours as needed for wheezing or shortness of breath. 05/30/17   Loletha Grayer, MD  atorvastatin (LIPITOR) 20 MG tablet Take 1 tablet (20 mg total) by mouth daily at 6 PM. 06/30/17   Epifanio Lesches, MD  cetirizine (ZYRTEC) 10 MG tablet TAKE 1/2 TABLET(5 MG)  BY MOUTH AT BEDTIME 06/25/17   [name], NP  cholecalciferol (VITAMIN D) 1000 units tablet Take 1,000 Units by mouth daily.    [provider]  clopidogrel (PLAVIX) 75 MG tablet Take 1 tablet (75 mg total) by mouth daily. 06/30/17   Epifanio Lesches, MD  FLUoxetine (PROZAC) 10 MG capsule TAKE 1 CAPSULE(10 MG) BY MOUTH DAILY 01/30/17   [name], NP  Fluticasone-Umeclidin-Vilant (TRELEGY ELLIPTA) 100-62.5-25 MCG/INH AEPB Inhale into the lungs. 07/13/17   [provider]  hydrALAZINE (APRESOLINE) 25 MG tablet Take 25 mg by  mouth 3 (three) times daily.     [provider]  ipratropium-albuterol (DUONEB) 0.5-2.5 (3) MG/3ML SOLN USE 1 VIAL VIA NEBULIZER EVERY 6 HOURS AS NEEDED 07/23/17   [name], MD  omeprazole (PRILOSEC) 40 MG capsule Take by mouth.    [provider]  polyethylene glycol (MIRALAX / GLYCOLAX) packet Take by mouth. 07/13/17 08/12/17  [provider]  promethazine (PHENERGAN) 12.5 MG tablet Take 12.5 mg by mouth every 6 (six) hours as needed.    [provider]  senna (SENOKOT) 8.6 MG tablet Take 2 tablets by mouth at bedtime as needed for constipation. 07/13/17 08/12/17  [provider]    Allergies  Allergen Reactions  . Bee Venom Swelling  . Enalapril Maleate Swelling  . Other     Bee stings    Family History  Problem Relation Age of Onset  . Alcohol abuse Mother   . Sickle cell anemia Daughter   . Hypertension Son   . Cancer Neg Hx   . COPD Neg Hx   . Diabetes Neg Hx   . Heart disease Neg Hx   . Stroke Neg Hx     Social History Social History   Tobacco Use  . Smoking status: Former Smoker    Packs/day: 0.50    Years: 60.00    Pack years: 30.00    Types: Cigarettes  . Smokeless tobacco: Never Used  Substance Use Topics  . Alcohol use: No    Alcohol/week: 0.0 oz    Comment: rare  . Drug use: No    Review of Systems Constitutional: Negative for fever. Eyes: Negative for visual complaints ENT: Negative for recent illness/congestion Cardiovascular: Mild chest tightness Respiratory: Positive for shortness of breath. Gastrointestinal: Negative for abdominal pain, vomiting Genitourinary: Negative for urinary compaints Musculoskeletal: No leg pain Skin: Negative for skin complaints  Neurological: Negative for headache All other ROS negative  ____________________________________________   PHYSICAL EXAM:  VITAL SIGNS: ED Triage Vitals  Enc Vitals Group     BP 07/24/17 1406 (!) 159/95     Pulse Rate 07/24/17 1406 87      Resp 07/24/17 1406 (!) 26     Temp 07/24/17 1406 98 F (36.7 C)     Temp Source 07/24/17 1406 Oral     SpO2 07/24/17 1406 98 %     Weight 07/24/17 1407 263 lb (119.3 kg)     Height 07/24/17 1407 5' (1.524 m)     Head Circumference --      Peak Flow --      Pain Score 07/24/17 1407 9     Pain Loc --      Pain Edu? --      Excl. in [place]? --    Constitutional: Alert and oriented. Well appearing and in no distress. Eyes: Normal exam ENT   Head: Normocephalic and atraumatic.   Mouth/Throat: Mucous membranes are moist. Cardiovascular: Normal rate,  regular rhythm. No murmur Respiratory: Mild tachypnea with moderate wheeze bilaterally.  No obvious rales or rhonchi. Gastrointestinal: Soft and nontender. No distention.   Musculoskeletal: Nontender with normal range of motion in all extremities.  Neurologic:  Normal speech and language. No gross focal neurologic deficits  Skin:  Skin is warm, dry and intact.  Psychiatric: Mood and affect are normal.  ____________________________________________    EKG  EKG reviewed and interpreted by myself shows sinus tachycardia at 90 bpm, narrow QRS, normal axis, normal intervals, no concerning ST changes.  ____________________________________________    RADIOLOGY  Chest x-ray on my evaluation does not appear to show any acute pneumonia, largely unchanged from prior x-ray.  Awaiting radiology read.  ____________________________________________   INITIAL IMPRESSION / ASSESSMENT AND PLAN / ED COURSE  Pertinent labs & imaging results that were available during my care of the patient were reviewed by me and considered in my medical decision making (see chart for details).  Patient presents to the emergency department for shortness of breath beginning last night.  History of COPD differential would include COPD exacerbation, pneumonia, pneumothorax, upper respiratory infection.  Patient's labs have resulted largely negative.  Troponin  normal.  Chest x-ray pending.  Patient appears to be improving on DuoNeb's.  I have dosed Solu-Medrol.  Patient continues to have mild expiratory wheeze bilateral with mild tachypnea.  I discussed with the patient admission to the hospitalist service for further treatment given likely COPD exacerbation.  Patient does not want to be admitted to the hospital.  They state they want to go to [facility].  I discussed that as we are capable of treating COPD exacerbations that this would be patient preference transfer.  I discussed transfer, they state they do not want to take the chance of being charged for the transfer to [facility] and the patient's daughter-in-law is coming to pick them up and they are going to sign out [place].  I discussed this at length with the patient as well as husband, they both are in agreement that they want to leave [employer] to drive themselves to [facility].  ____________________________________________   FINAL CLINICAL IMPRESSION(S) / ED DIAGNOSES  Dyspnea COPD exacerbation    Harvest Dark, MD 07/24/17 1503    Harvest Dark, MD 07/24/17 1504    Harvest Dark, MD 07/24/17 1516

## 2017-07-24 NOTE — ED Notes (Signed)
Notified by [employer] officer that pt's family member is at front desk demanding pt be transferred to [facility]. EDP notifed, states he will speak to pt and family as soon as possible. Pt permitting protocols to be done and breathing treatments given.

## 2017-07-24 NOTE — ED Triage Notes (Signed)
Pt presents to ED via [facility] from home c/o shortness of breath and L-sided, reproducible chest pain that worsens with difficulty breathing. Exp wheezing noted bil. Pt states admission 2 wks ago for COPD exacerbation, states has been out of nebulizer meds x1 week because her machine broke.

## 2017-07-24 NOTE — Telephone Encounter (Signed)
Getting information from Amy from [place]. Pt. C/o shortness of breath ,chest pain, palpitations.Uses O2 continuous. Pulse ox 90-95%, pulse 77 - 107. While speaking to Amy, she stated she had to go and call 911 and ended call. Answer Assessment - Initial Assessment Questions 1. LOCATION: "Where does it hurt?"       Left side of chest 2. RADIATION: "Does the pain go anywhere else?" (e.g., into neck, jaw, arms, back)     aCHY ALL OVER 3. ONSET: "When did the chest pain begin?" (Minutes, hours or days)      Started last night 4. PATTERN "Does the pain come and go, or has it been constant since it started?"  "Does it get worse with exertion?"      Constant 5. DURATION: "How long does it last" (e.g., seconds, minutes, hours)     Since last night 6. SEVERITY: "How bad is the pain?"  (e.g., Scale 1-10; mild, moderate, or severe)    - MILD (1-3): doesn't interfere with normal activities     - MODERATE (4-7): interferes with normal activities or awakens from sleep    - SEVERE (8-10): excruciating pain, unable to do any normal activities       9 7. CARDIAC RISK FACTORS: "Do you have any history of heart problems or risk factors for heart disease?" (e.g., prior heart attack, angina; high blood pressure, diabetes, being overweight, high cholesterol, smoking, or strong family history of heart disease)     CVA 8. PULMONARY RISK FACTORS: "Do you have any history of lung disease?"  (e.g., blood clots in lung, asthma, emphysema, birth control pills)     COPD 9. CAUSE: "What do you think is causing the chest pain?"     Unsure 10. OTHER SYMPTOMS: "Do you have any other symptoms?" (e.g., dizziness, nausea, vomiting, sweating, fever, difficulty breathing, cough)       Palpitations, pain, short of breath 11. PREGNANCY: "Is there any chance you are pregnant?" "When was your last menstrual period?"       N/A  Protocols used: CHEST PAIN-A-AH

## 2017-07-24 NOTE — ED Notes (Signed)
Pt requesting to sign out AMA. States her family want to take her to [facility]. O2 sat 94% on chronic 2L. RR22, pt still has increased WOB and tachypnea. Education on risks of signing out [place] completed by nursing staff and EDP Pad[place]owski. Pt still wants to sign out AMA. Pt's husband present at bedside, in agreement with desire to bring pt to [facility], state another family member is already on the way to pick pt up. Pt's spouse signed AMA form per pt instruction. Pt switched over to personal oxygen concentrator and transported to vehicle via wheelchair.

## 2017-07-25 ENCOUNTER — Inpatient Hospital Stay: Payer: Medicare HMO | Admitting: Unknown Physician Specialty

## 2017-07-25 NOTE — Telephone Encounter (Signed)
Zyrtec 10 mg and Prozac 10 mg refill requests  LOV 06/12/16 with [name].    Has multiple ED visits but no f/u appts.  [place] [place] - [address]

## 2017-07-26 ENCOUNTER — Other Ambulatory Visit: Payer: Self-pay

## 2017-07-26 NOTE — Patient Outreach (Signed)
[facility]) Care Management  07/26/2017  Helene Bernstein 11-08-1940 [id number]  Multidisciplinary Case Discussion completed today. Patient has established care with [facility] PCP that is out of network with provider. Patient is no longer eligible for [facility] Care Management services. Case closed.  Akiva Josey E. Rollene Rotunda RN, BSN [facility] Care Management Coordinator [phone]

## 2017-07-27 ENCOUNTER — Telehealth: Payer: Self-pay | Admitting: Family Medicine

## 2017-07-27 NOTE — Telephone Encounter (Signed)
Copied from [place] [phone]. Topic: General - Other >> Jul 11, 2017  3:06 PM Yvette Rack wrote: Reason for CRM: Denise with [facility] called in to advise that the pt husband has declined [place]. Cb# [phone]   >> Jul 11, 2017  4:05 PM Stark Klein wrote: [facility] [place] only

## 2017-07-30 DIAGNOSIS — R600 Localized edema: Secondary | ICD-10-CM | POA: Diagnosis not present

## 2017-07-30 DIAGNOSIS — J449 Chronic obstructive pulmonary disease, unspecified: Secondary | ICD-10-CM | POA: Diagnosis not present

## 2017-07-30 DIAGNOSIS — R0789 Other chest pain: Secondary | ICD-10-CM | POA: Diagnosis not present

## 2017-08-01 DIAGNOSIS — J9611 Chronic respiratory failure with hypoxia: Secondary | ICD-10-CM | POA: Diagnosis not present

## 2017-08-01 DIAGNOSIS — R001 Bradycardia, unspecified: Secondary | ICD-10-CM | POA: Diagnosis not present

## 2017-08-01 DIAGNOSIS — G5600 Carpal tunnel syndrome, unspecified upper limb: Secondary | ICD-10-CM | POA: Diagnosis not present

## 2017-08-01 DIAGNOSIS — R4182 Altered mental status, unspecified: Secondary | ICD-10-CM | POA: Diagnosis not present

## 2017-08-01 DIAGNOSIS — I69320 Aphasia following cerebral infarction: Secondary | ICD-10-CM | POA: Diagnosis not present

## 2017-08-01 DIAGNOSIS — J449 Chronic obstructive pulmonary disease, unspecified: Secondary | ICD-10-CM | POA: Diagnosis not present

## 2017-08-01 DIAGNOSIS — N183 Chronic kidney disease, stage 3 (moderate): Secondary | ICD-10-CM | POA: Diagnosis not present

## 2017-08-01 DIAGNOSIS — Z8673 Personal history of transient ischemic attack (TIA), and cerebral infarction without residual deficits: Secondary | ICD-10-CM | POA: Diagnosis not present

## 2017-08-01 DIAGNOSIS — M6281 Muscle weakness (generalized): Secondary | ICD-10-CM | POA: Diagnosis not present

## 2017-08-01 DIAGNOSIS — I129 Hypertensive chronic kidney disease with stage 1 through stage 4 chronic kidney disease, or unspecified chronic kidney disease: Secondary | ICD-10-CM | POA: Diagnosis not present

## 2017-08-01 DIAGNOSIS — Z87891 Personal history of nicotine dependence: Secondary | ICD-10-CM | POA: Diagnosis not present

## 2017-08-01 DIAGNOSIS — I517 Cardiomegaly: Secondary | ICD-10-CM | POA: Diagnosis not present

## 2017-08-01 DIAGNOSIS — J441 Chronic obstructive pulmonary disease with (acute) exacerbation: Secondary | ICD-10-CM | POA: Diagnosis not present

## 2017-08-01 DIAGNOSIS — R4701 Aphasia: Secondary | ICD-10-CM | POA: Diagnosis not present

## 2017-08-01 DIAGNOSIS — J9612 Chronic respiratory failure with hypercapnia: Secondary | ICD-10-CM | POA: Diagnosis not present

## 2017-08-01 DIAGNOSIS — R0602 Shortness of breath: Secondary | ICD-10-CM | POA: Diagnosis not present

## 2017-08-01 DIAGNOSIS — I69322 Dysarthria following cerebral infarction: Secondary | ICD-10-CM | POA: Diagnosis not present

## 2017-08-01 DIAGNOSIS — Z6841 Body Mass Index (BMI) 40.0 and over, adult: Secondary | ICD-10-CM | POA: Diagnosis not present

## 2017-08-02 DIAGNOSIS — N183 Chronic kidney disease, stage 3 (moderate): Secondary | ICD-10-CM | POA: Diagnosis not present

## 2017-08-02 DIAGNOSIS — R4701 Aphasia: Secondary | ICD-10-CM | POA: Diagnosis not present

## 2017-08-02 DIAGNOSIS — G5600 Carpal tunnel syndrome, unspecified upper limb: Secondary | ICD-10-CM | POA: Diagnosis not present

## 2017-08-02 DIAGNOSIS — I129 Hypertensive chronic kidney disease with stage 1 through stage 4 chronic kidney disease, or unspecified chronic kidney disease: Secondary | ICD-10-CM | POA: Diagnosis not present

## 2017-08-02 DIAGNOSIS — J9611 Chronic respiratory failure with hypoxia: Secondary | ICD-10-CM | POA: Diagnosis not present

## 2017-08-02 DIAGNOSIS — J449 Chronic obstructive pulmonary disease, unspecified: Secondary | ICD-10-CM | POA: Diagnosis not present

## 2017-08-02 DIAGNOSIS — J9612 Chronic respiratory failure with hypercapnia: Secondary | ICD-10-CM | POA: Diagnosis not present

## 2017-08-04 DIAGNOSIS — J449 Chronic obstructive pulmonary disease, unspecified: Secondary | ICD-10-CM | POA: Diagnosis not present

## 2017-08-07 DIAGNOSIS — R079 Chest pain, unspecified: Secondary | ICD-10-CM | POA: Diagnosis not present

## 2017-08-07 DIAGNOSIS — Z8673 Personal history of transient ischemic attack (TIA), and cerebral infarction without residual deficits: Secondary | ICD-10-CM | POA: Diagnosis not present

## 2017-08-07 DIAGNOSIS — R0682 Tachypnea, not elsewhere classified: Secondary | ICD-10-CM | POA: Diagnosis not present

## 2017-08-07 DIAGNOSIS — I10 Essential (primary) hypertension: Secondary | ICD-10-CM | POA: Diagnosis not present

## 2017-08-07 DIAGNOSIS — R0602 Shortness of breath: Secondary | ICD-10-CM | POA: Diagnosis not present

## 2017-08-07 DIAGNOSIS — J811 Chronic pulmonary edema: Secondary | ICD-10-CM | POA: Diagnosis not present

## 2017-08-07 DIAGNOSIS — J441 Chronic obstructive pulmonary disease with (acute) exacerbation: Secondary | ICD-10-CM | POA: Diagnosis not present

## 2017-08-07 DIAGNOSIS — Z87891 Personal history of nicotine dependence: Secondary | ICD-10-CM | POA: Diagnosis not present

## 2017-08-07 DIAGNOSIS — M1990 Unspecified osteoarthritis, unspecified site: Secondary | ICD-10-CM | POA: Diagnosis not present

## 2017-08-07 DIAGNOSIS — M62830 Muscle spasm of back: Secondary | ICD-10-CM | POA: Diagnosis not present

## 2017-08-07 DIAGNOSIS — M549 Dorsalgia, unspecified: Secondary | ICD-10-CM | POA: Diagnosis not present

## 2017-08-07 DIAGNOSIS — Z96651 Presence of right artificial knee joint: Secondary | ICD-10-CM | POA: Diagnosis not present

## 2017-08-07 DIAGNOSIS — I517 Cardiomegaly: Secondary | ICD-10-CM | POA: Diagnosis not present

## 2017-08-08 DIAGNOSIS — I517 Cardiomegaly: Secondary | ICD-10-CM | POA: Diagnosis not present

## 2017-08-08 DIAGNOSIS — J811 Chronic pulmonary edema: Secondary | ICD-10-CM | POA: Diagnosis not present

## 2017-08-08 DIAGNOSIS — R079 Chest pain, unspecified: Secondary | ICD-10-CM | POA: Diagnosis not present

## 2017-08-09 ENCOUNTER — Ambulatory Visit: Payer: Medicare HMO | Admitting: Pulmonary Disease

## 2017-08-09 ENCOUNTER — Encounter: Payer: Self-pay | Admitting: Pulmonary Disease

## 2017-08-09 ENCOUNTER — Telehealth: Payer: Self-pay | Admitting: Unknown Physician Specialty

## 2017-08-09 VITALS — BP 136/78 | HR 94 | Ht 60.0 in | Wt 270.0 lb

## 2017-08-09 DIAGNOSIS — J9611 Chronic respiratory failure with hypoxia: Secondary | ICD-10-CM

## 2017-08-09 DIAGNOSIS — Z87891 Personal history of nicotine dependence: Secondary | ICD-10-CM

## 2017-08-09 MED ORDER — FLUTICASONE-UMECLIDIN-VILANT 100-62.5-25 MCG/INH IN AEPB: 1.0000 | INHALATION_SPRAY | Freq: Every day | RESPIRATORY_TRACT | 5 refills | Status: DC

## 2017-08-09 NOTE — Telephone Encounter (Signed)
Copied from [place] [phone]. Topic: Inquiry >> Aug 09, 2017 10:54 AM Lennox Solders wrote: Reason for CRM: Rip Harbour rn is calling [facility] . The patient was in hospital and was discharge on 07-25-17. Rip Harbour would like orders to resume care for PT, OT,ST and nursing

## 2017-08-09 NOTE — Patient Instructions (Signed)
Begin Trelegy inhaler, 1 inhalation daily  Continue oxygen therapy 24 hours/day  Continue nebulized medications as needed  We discussed the importance of weight loss and the impact that your weight is having on your breathing  Follow-up in 3 to 4 months

## 2017-08-09 NOTE — Telephone Encounter (Signed)
Message relayed to patient. Verbalized understanding and denied questions.   

## 2017-08-09 NOTE — Telephone Encounter (Signed)
OK to give verbal orders 

## 2017-08-09 NOTE — Telephone Encounter (Signed)
Please advise 

## 2017-08-09 NOTE — Progress Notes (Signed)
meth

## 2017-08-10 DIAGNOSIS — R4701 Aphasia: Secondary | ICD-10-CM | POA: Diagnosis not present

## 2017-08-10 DIAGNOSIS — I69991 Dysphagia following unspecified cerebrovascular disease: Secondary | ICD-10-CM | POA: Diagnosis not present

## 2017-08-10 DIAGNOSIS — R1313 Dysphagia, pharyngeal phase: Secondary | ICD-10-CM | POA: Diagnosis not present

## 2017-08-10 DIAGNOSIS — R633 Feeding difficulties: Secondary | ICD-10-CM | POA: Diagnosis not present

## 2017-08-10 DIAGNOSIS — R1310 Dysphagia, unspecified: Secondary | ICD-10-CM | POA: Diagnosis not present

## 2017-08-11 DIAGNOSIS — I2510 Atherosclerotic heart disease of native coronary artery without angina pectoris: Secondary | ICD-10-CM | POA: Diagnosis not present

## 2017-08-11 DIAGNOSIS — I270 Primary pulmonary hypertension: Secondary | ICD-10-CM | POA: Diagnosis not present

## 2017-08-11 DIAGNOSIS — J441 Chronic obstructive pulmonary disease with (acute) exacerbation: Secondary | ICD-10-CM | POA: Diagnosis not present

## 2017-08-11 DIAGNOSIS — E875 Hyperkalemia: Secondary | ICD-10-CM | POA: Diagnosis not present

## 2017-08-11 DIAGNOSIS — I129 Hypertensive chronic kidney disease with stage 1 through stage 4 chronic kidney disease, or unspecified chronic kidney disease: Secondary | ICD-10-CM | POA: Diagnosis not present

## 2017-08-11 DIAGNOSIS — J9611 Chronic respiratory failure with hypoxia: Secondary | ICD-10-CM | POA: Diagnosis not present

## 2017-08-11 DIAGNOSIS — J9612 Chronic respiratory failure with hypercapnia: Secondary | ICD-10-CM | POA: Diagnosis not present

## 2017-08-11 DIAGNOSIS — I69320 Aphasia following cerebral infarction: Secondary | ICD-10-CM | POA: Diagnosis not present

## 2017-08-11 DIAGNOSIS — N183 Chronic kidney disease, stage 3 (moderate): Secondary | ICD-10-CM | POA: Diagnosis not present

## 2017-08-12 ENCOUNTER — Encounter: Payer: Self-pay | Admitting: Pulmonary Disease

## 2017-08-12 DIAGNOSIS — J449 Chronic obstructive pulmonary disease, unspecified: Secondary | ICD-10-CM | POA: Diagnosis not present

## 2017-08-12 NOTE — Progress Notes (Signed)
PULMONARY OFFICE FOLLOW UP NOTE  Requesting MD/Service: [name], NP Date of initial consultation: 01/20/16 Reason for consultation:  Chronic hypoxemic respiratory failure, COPD, OHS  PT PROFILE: 77 y.o. F smoker referred for evaluation of chronic hypoxemic respiratory failure COPD, OHS.   DATA: 02/12/15 CT chest:No evidence of pulmonary embolism. No evidence of acute cardiopulmonary disease 06/21/15 spirometry: FVC: 1.82 L (85 %pred), FEV1:  1.38 L (83 %pred), FEV1/FVC: 75%   INTERVAL: Hospitalized 5/16-5/18/19 for aphasia  SUBJ:  This is a scheduled F/U. Her respiratory status remains about the same. She remains profoundly limited by DOE due to very severe obesity and possible COPD. She is on chronic supplemental O2. She has not smoked I n3 months. Her weight is up > 20# from previously and she has increased LE edema. She is wearing O2 compliantly. Denies CP, fever, purulent sputum, hemoptysis and calf tenderness.  Vitals:   08/09/17 1132 08/09/17 1141  BP:  136/78  Pulse:  94  SpO2:  90%  Weight: 270 lb (122.5 kg)   Height: 5' (1.524 m)   3 LPM [place] pulse   EXAM:  Gen: Extremely obese in WC, No overt respiratory distress HEENT: NCAT, sclera white, oropharynx normal Neck:  Very short, thick neck, JVP cannot be visualized Lungs: Diminished throughout without wheezes or other adventitious sounds Cardiovascular: distant HS, RRR, no murmurs noted Abdomen: Obese, soft, nontender, normal BS Ext: without clubbing, cyanosis. 1-2+ symmetric ankle and pedal edema Neuro: no focal deficits  DATA:   BMP Latest Ref Rng & Units 07/24/2017 07/07/2017 06/28/2017  Glucose 65 - 99 mg/dL 138(H) 155(H) 114(H)  BUN 6 - 20 mg/dL 19 20 21(H)  Creatinine 0.44 - 1.00 mg/dL 1.68(H) 1.74(H) 1.69(H)  BUN/Creat Ratio 12 - 28 - - -  Sodium 135 - 145 mmol/L 142 141 143  Potassium 3.5 - 5.1 mmol/L 4.5 4.6 4.8  Chloride 101 - 111 mmol/L 105 107 109  CO2 22 - 32 mmol/L 27 30 27   Calcium 8.9 - 10.3  mg/dL 9.4 8.8(L) 9.2    CBC Latest Ref Rng & Units 07/24/2017 07/07/2017 06/28/2017  WBC 3.6 - 11.0 K/uL 6.5 4.7 5.4  Hemoglobin 12.0 - 16.0 g/dL 11.3(L) 11.5(L) 12.6  Hematocrit 35.0 - 47.0 % 35.2 35.3 36.8  Platelets 150 - 440 K/uL 219 197 181    CXR 07/24/17:CM, low volumes, no acute findings  IMPRESSION:     ICD-10-CM   1. Former smoker Z87.891   2. Chronic hypoxemic respiratory failure (HCC) J96.11   3. Morbid obesity due to excess calories (HCC) E66.01    Dyspnea and hypoxemia due to obesity and COPD. She is presently only using nebulized BDs (Duoneb) and is using these 4-6 times per day. She should be on chronic maintenance therapy  PLAN:  Begin Trelegy inhaler, 1 inhalation daily  Continue oxygen therapy 24 hours/day  Continue nebulized medications as needed  We discussed the importance of weight loss and the impact that your weight is having on your breathing  Follow-up in 3 to 4 months  Merton Border, MD PCCM service Mobile [phone] Pager [phone] 08/12/2017 9:01 PM

## 2017-08-13 ENCOUNTER — Encounter: Payer: Self-pay | Admitting: Unknown Physician Specialty

## 2017-08-13 NOTE — Addendum Note (Signed)
Addended by: [name] on: 08/13/2017 09:16 AM   Modules accepted: Level of Service

## 2017-08-14 ENCOUNTER — Telehealth: Payer: Self-pay | Admitting: Unknown Physician Specialty

## 2017-08-14 IMAGING — CR DG CHEST 2V
1 series · 2 of 2 positions shown · non-contrast
Comparison: Chest x-rays dated 09/16/2014 and 02/20/2013 and chest
CT dated 09/16/2014

CLINICAL DATA: Cough, chest congestion and shortness of breath.

EXAM:
CHEST  2 VIEW

[Series 1: dg chest 2 view · 0.14mm/px · 2 of 2 slices shown]
[im 1/2]
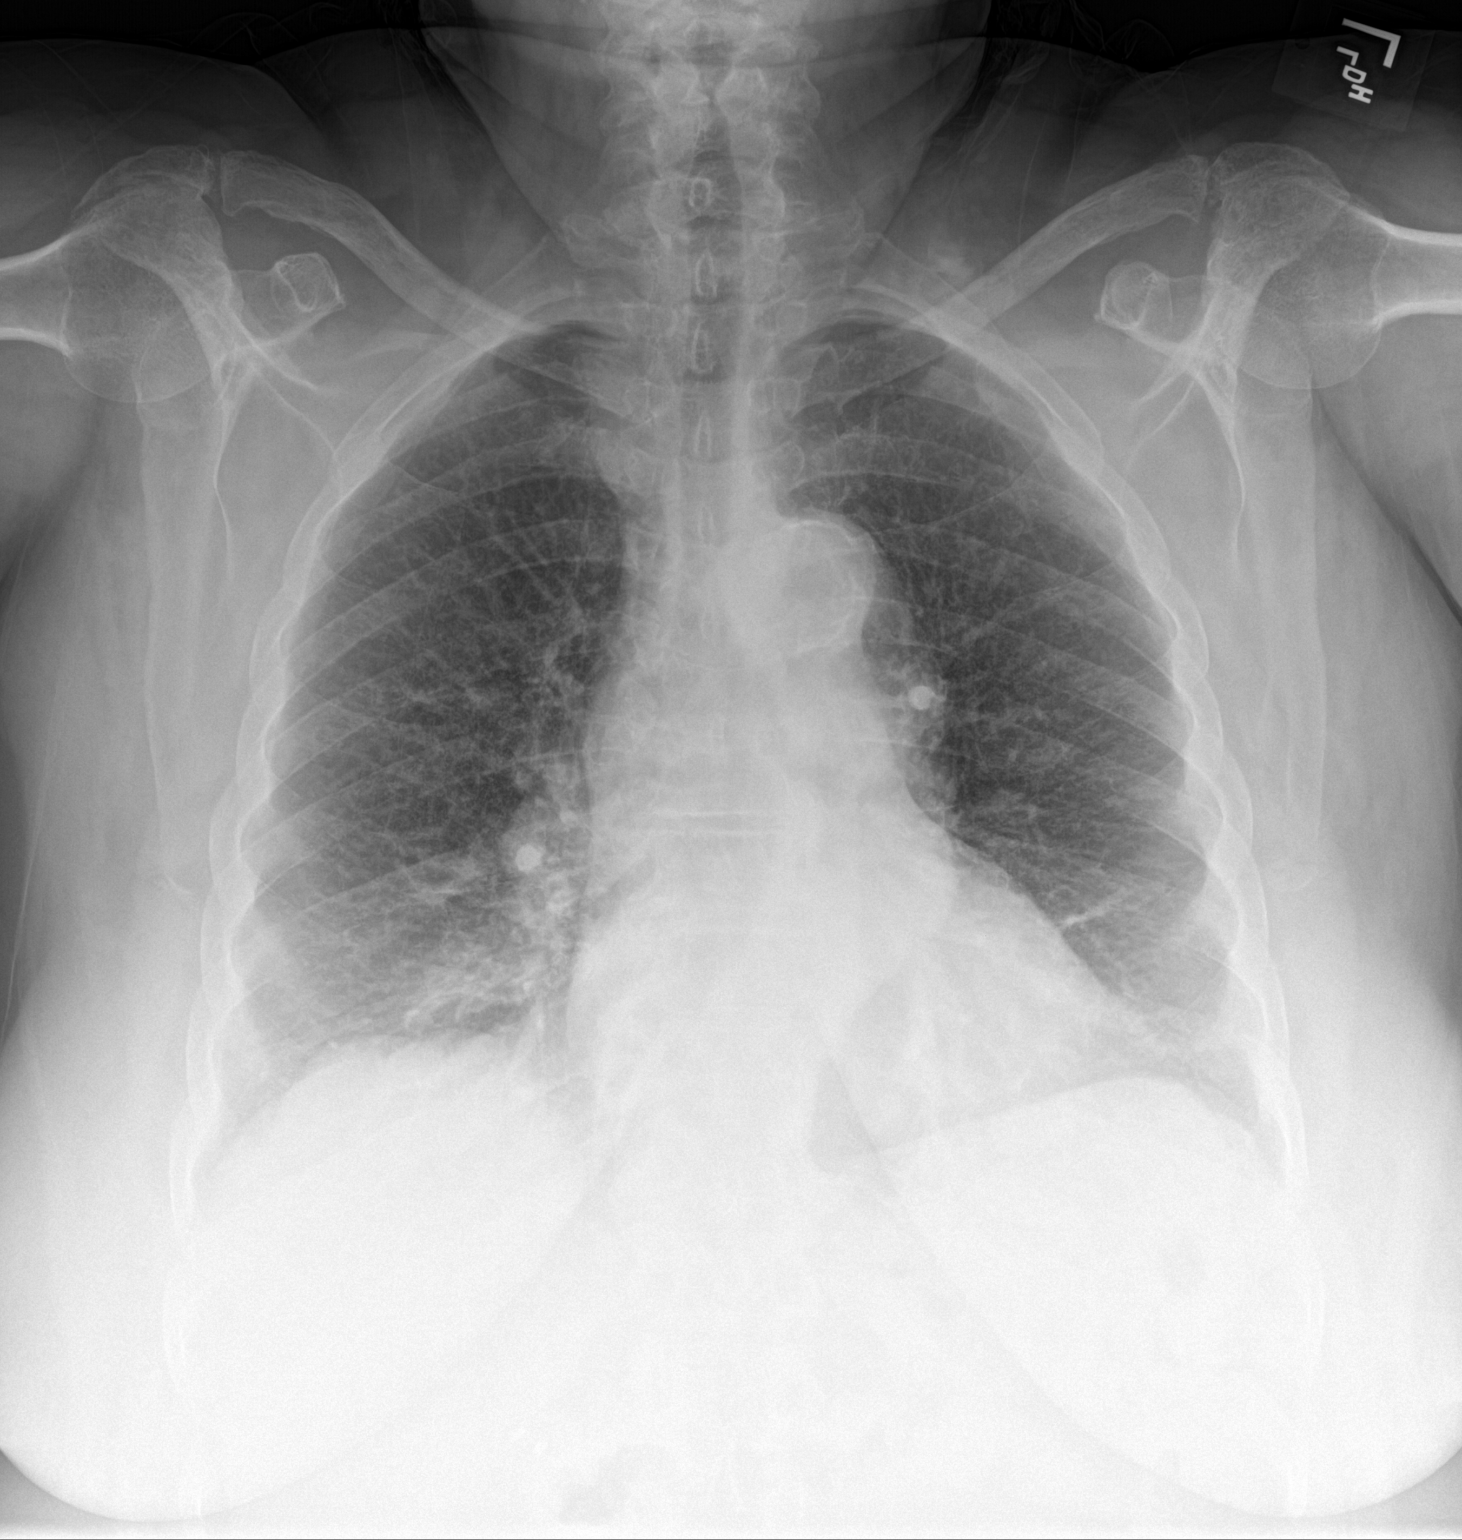
[im 2/2]
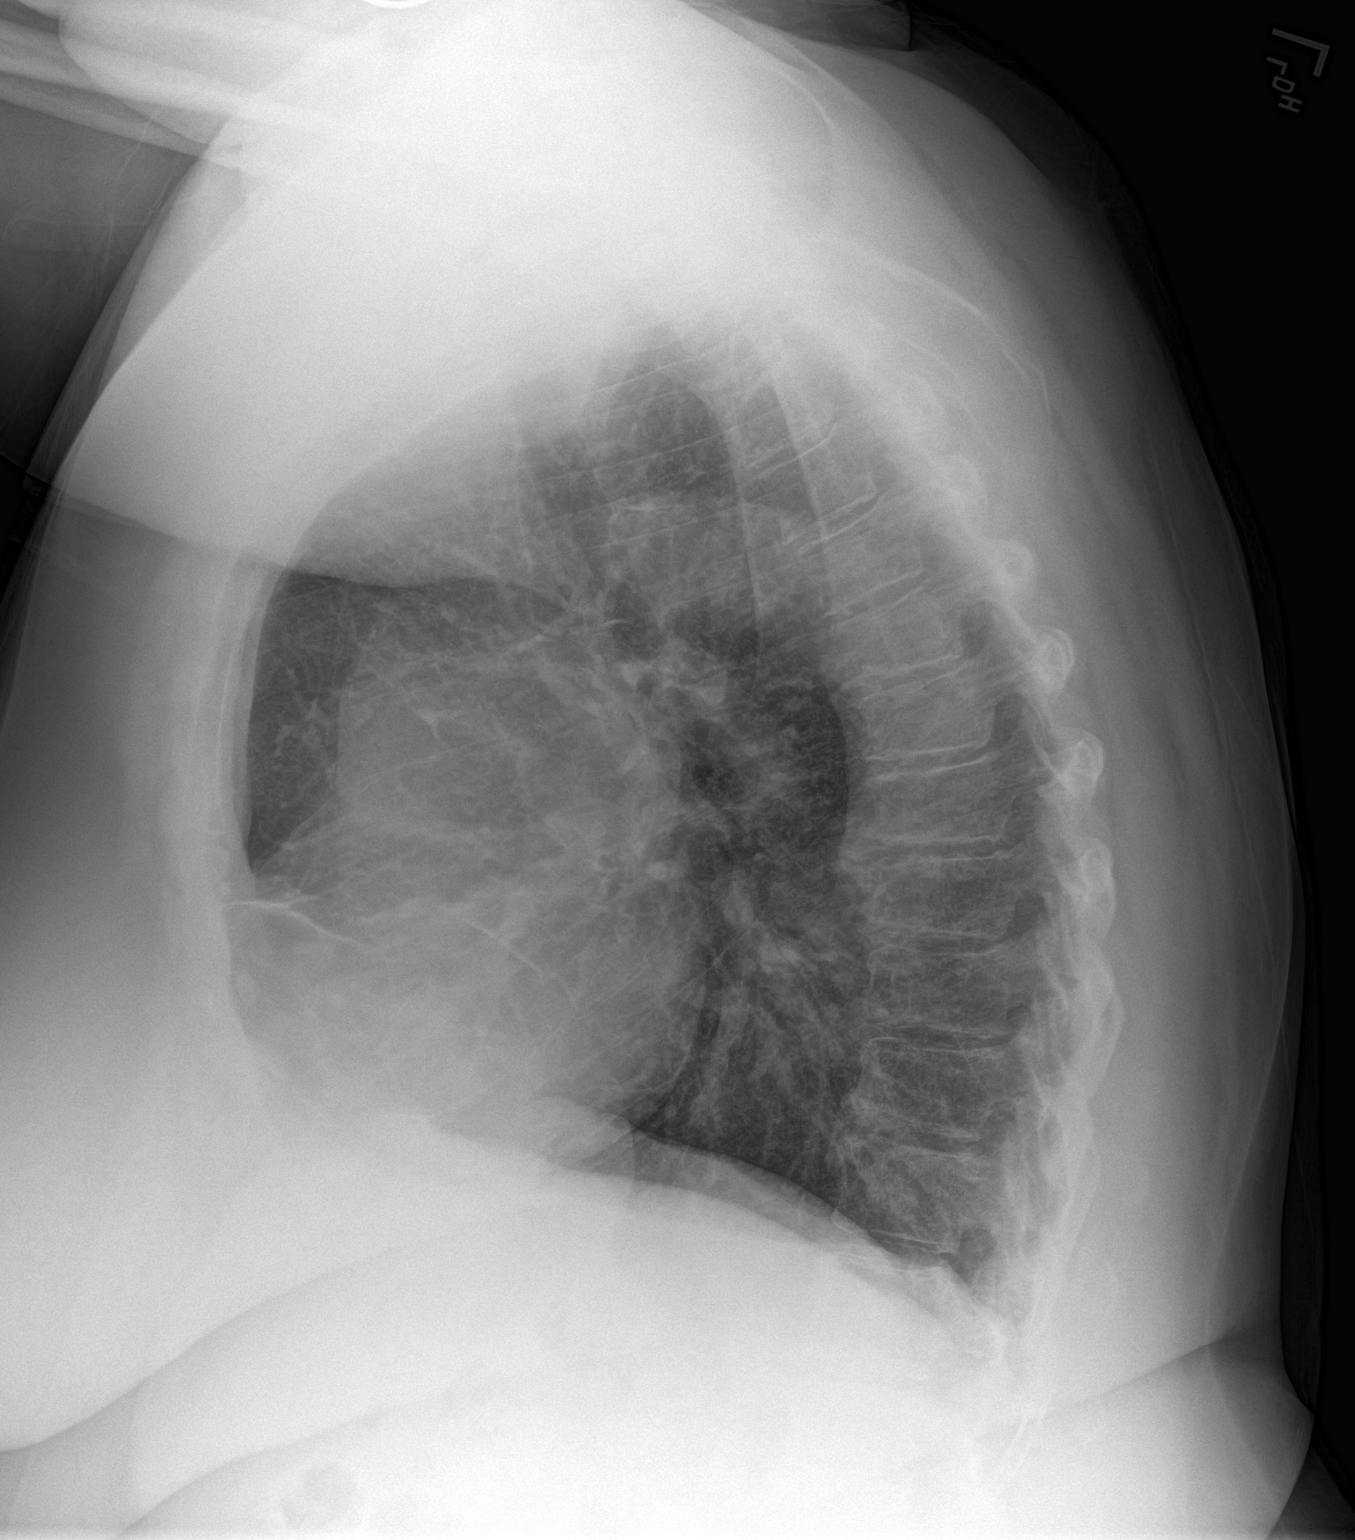

[2 of 2 positions shown; findings below may reference images not displayed]

FINDINGS: Heart size and pulmonary vascularity are normal. Calcification and
tortuosity of the thoracic aorta. Chronic linear scarring at the
lung bases. No acute infiltrates or effusions. No acute osseous
abnormality.
IMPRESSION: No acute abnormality.  Chronic scarring at the bases.

## 2017-08-14 NOTE — Telephone Encounter (Signed)
Copied from [place] [id number]. Topic: Quick Communication - See Telephone Encounter >> Aug 14, 2017 11:52 AM Ahmed Prima L wrote: CRM for notification. See Telephone encounter for: 08/14/17.  Amy, RN with [facility] called and stated she started back with home health services on saturday after she was discharged from the hospital. She needs a home health aid.  They would like to see her 2 times a week for two weeks, one time a week for four weeks for nursing. Call back @ [phone]

## 2017-08-14 NOTE — Telephone Encounter (Signed)
OK 

## 2017-08-15 ENCOUNTER — Telehealth: Payer: Self-pay | Admitting: Unknown Physician Specialty

## 2017-08-15 NOTE — Telephone Encounter (Unsigned)
Copied from [place] [phone]. Topic: Quick Communication - See Telephone Encounter >> Aug 15, 2017  4:49 PM Percell Belt A wrote: CRM for notification. See Telephone encounter for: 08/15/17. Izora Gala with [facility] called and stated there will be a belay in [place]   [phone]

## 2017-08-17 NOTE — Telephone Encounter (Signed)
I'm not sure what advice is required?

## 2017-08-17 NOTE — Telephone Encounter (Signed)
Just wanted to make sure it was ok for them to go out.

## 2017-08-17 NOTE — Telephone Encounter (Signed)
Yes it is fine

## 2017-08-20 DIAGNOSIS — J449 Chronic obstructive pulmonary disease, unspecified: Secondary | ICD-10-CM | POA: Diagnosis not present

## 2017-08-20 DIAGNOSIS — G8929 Other chronic pain: Secondary | ICD-10-CM | POA: Diagnosis not present

## 2017-08-20 DIAGNOSIS — R0609 Other forms of dyspnea: Secondary | ICD-10-CM | POA: Diagnosis not present

## 2017-08-20 DIAGNOSIS — Z515 Encounter for palliative care: Secondary | ICD-10-CM | POA: Diagnosis not present

## 2017-08-20 DIAGNOSIS — M25532 Pain in left wrist: Secondary | ICD-10-CM | POA: Diagnosis not present

## 2017-08-20 DIAGNOSIS — F4321 Adjustment disorder with depressed mood: Secondary | ICD-10-CM | POA: Diagnosis not present

## 2017-08-20 DIAGNOSIS — Z7189 Other specified counseling: Secondary | ICD-10-CM | POA: Diagnosis not present

## 2017-08-21 ENCOUNTER — Telehealth: Payer: Self-pay | Admitting: Unknown Physician Specialty

## 2017-08-21 DIAGNOSIS — I69320 Aphasia following cerebral infarction: Secondary | ICD-10-CM | POA: Diagnosis not present

## 2017-08-21 DIAGNOSIS — J441 Chronic obstructive pulmonary disease with (acute) exacerbation: Secondary | ICD-10-CM | POA: Diagnosis not present

## 2017-08-21 DIAGNOSIS — J9612 Chronic respiratory failure with hypercapnia: Secondary | ICD-10-CM | POA: Diagnosis not present

## 2017-08-21 DIAGNOSIS — N183 Chronic kidney disease, stage 3 (moderate): Secondary | ICD-10-CM | POA: Diagnosis not present

## 2017-08-21 DIAGNOSIS — I129 Hypertensive chronic kidney disease with stage 1 through stage 4 chronic kidney disease, or unspecified chronic kidney disease: Secondary | ICD-10-CM | POA: Diagnosis not present

## 2017-08-21 DIAGNOSIS — J9611 Chronic respiratory failure with hypoxia: Secondary | ICD-10-CM | POA: Diagnosis not present

## 2017-08-21 DIAGNOSIS — I270 Primary pulmonary hypertension: Secondary | ICD-10-CM | POA: Diagnosis not present

## 2017-08-21 DIAGNOSIS — E875 Hyperkalemia: Secondary | ICD-10-CM | POA: Diagnosis not present

## 2017-08-21 DIAGNOSIS — I2510 Atherosclerotic heart disease of native coronary artery without angina pectoris: Secondary | ICD-10-CM | POA: Diagnosis not present

## 2017-08-21 NOTE — Telephone Encounter (Signed)
Called and gave verbal order to [place] per [place].

## 2017-08-21 NOTE — Telephone Encounter (Signed)
Orders given.  

## 2017-08-21 NOTE — Telephone Encounter (Signed)
Copied from [place] [phone]. Topic: General - Other >> Aug 21, 2017 11:22 AM Margot Ables wrote: Reason for CRM: requesting VO for OT 1 week 4, she has secure VM if she is not able to answer

## 2017-08-22 ENCOUNTER — Encounter: Payer: Self-pay | Admitting: Unknown Physician Specialty

## 2017-08-22 ENCOUNTER — Ambulatory Visit
Admission: RE | Admit: 2017-08-22 | Discharge: 2017-08-22 | Disposition: A | Discharge status: Home / Self Care | Payer: Medicare HMO | Source: Ambulatory Visit | Attending: Unknown Physician Specialty | Admitting: Unknown Physician Specialty

## 2017-08-22 ENCOUNTER — Ambulatory Visit (INDEPENDENT_AMBULATORY_CARE_PROVIDER_SITE_OTHER): Payer: Medicare HMO | Admitting: Unknown Physician Specialty

## 2017-08-22 ENCOUNTER — Other Ambulatory Visit: Payer: Self-pay

## 2017-08-22 ENCOUNTER — Telehealth: Payer: Self-pay | Admitting: Unknown Physician Specialty

## 2017-08-22 VITALS — BP 89/61 | HR 108 | Temp 98.8°F | Ht 60.0 in | Wt 263.2 lb

## 2017-08-22 DIAGNOSIS — R4701 Aphasia: Secondary | ICD-10-CM | POA: Diagnosis not present

## 2017-08-22 DIAGNOSIS — N183 Chronic kidney disease, stage 3 unspecified: Secondary | ICD-10-CM

## 2017-08-22 DIAGNOSIS — M79642 Pain in left hand: Secondary | ICD-10-CM | POA: Diagnosis not present

## 2017-08-22 DIAGNOSIS — G8929 Other chronic pain: Secondary | ICD-10-CM | POA: Diagnosis not present

## 2017-08-22 DIAGNOSIS — M25562 Pain in left knee: Secondary | ICD-10-CM | POA: Diagnosis not present

## 2017-08-22 DIAGNOSIS — M19042 Primary osteoarthritis, left hand: Secondary | ICD-10-CM | POA: Insufficient documentation

## 2017-08-22 DIAGNOSIS — Z789 Other specified health status: Secondary | ICD-10-CM | POA: Diagnosis not present

## 2017-08-22 DIAGNOSIS — J449 Chronic obstructive pulmonary disease, unspecified: Secondary | ICD-10-CM

## 2017-08-22 DIAGNOSIS — I10 Essential (primary) hypertension: Secondary | ICD-10-CM | POA: Diagnosis not present

## 2017-08-22 DIAGNOSIS — M79645 Pain in left finger(s): Secondary | ICD-10-CM | POA: Diagnosis not present

## 2017-08-22 IMAGING — CR DG CHEST 1V PORT
1 series · 1 of 1 positions shown · non-contrast
Comparison: 02/02/2015

CLINICAL DATA: Shortness of Breath

EXAM:
PORTABLE CHEST - 1 VIEW

[ap]
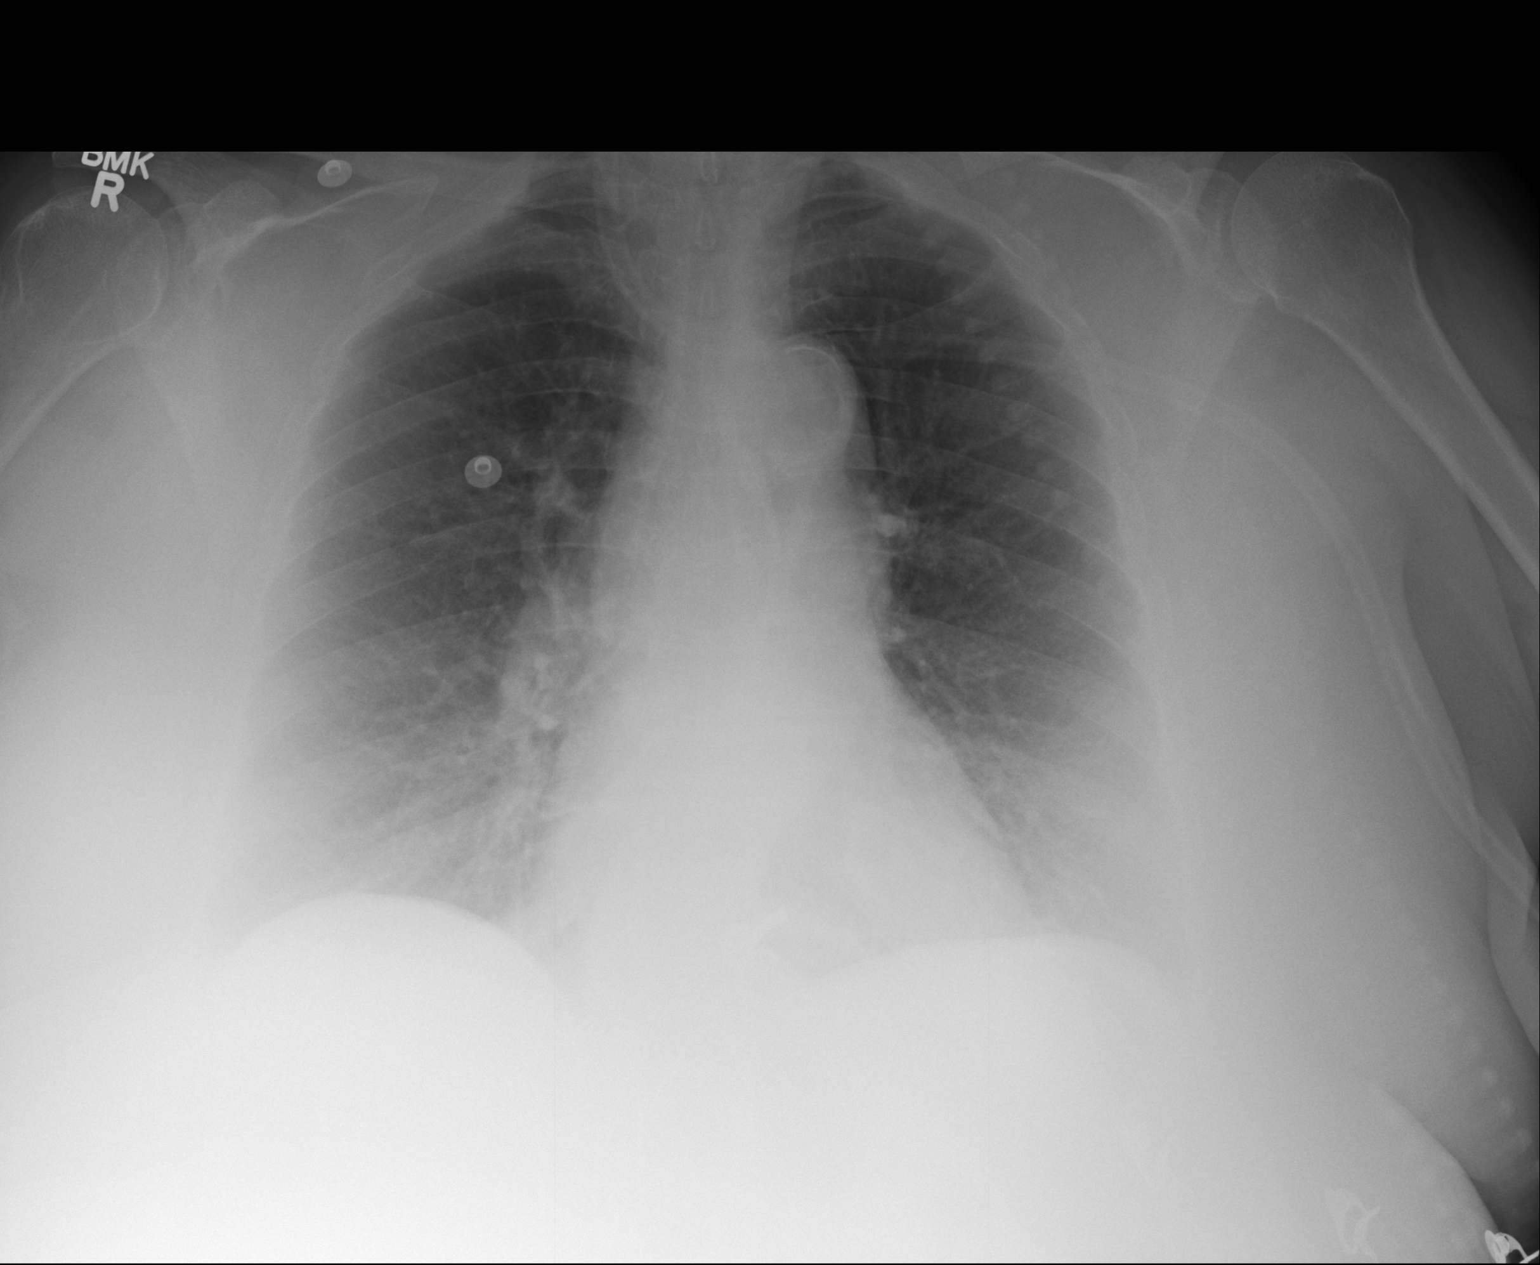

[1 of 1 positions shown; findings below may reference images not displayed]

FINDINGS: The heart size and mediastinal contours are within normal limits.
Both lungs are clear. The visualized skeletal structures are
unremarkable.
IMPRESSION: No active disease.

## 2017-08-22 NOTE — Assessment & Plan Note (Signed)
Seeing Nephrology

## 2017-08-22 NOTE — Progress Notes (Addendum)
BP (!) 89/61   Pulse (!) 108   Temp 98.8 F (37.1 C) (Oral)   Ht 5' (1.524 m)   Wt 263 lb 3.2 oz (119.4 kg)   SpO2 95%   BMI 51.40 kg/m    Subjective:    Patient ID: Katherine [name], female    DOB: May 29, 1940, 77 y.o.   MRN: [id number]  HPI: Katherine [name] is a 77 y.o. female  Chief Complaint  Patient presents with  . Hypertension  . Hand Pain    left hand, patient states her hand and fingers are very sore   Saw Dr. Leonidas Romberg on 6/27 who notes she remains profoundly limited by DOE. ? COPD status.  Taking chronic supplemental O2  Inhaler changed to Trelegy and weight loss encouraged.  She feels this new inhaler has helped.    Pt continues with frequent hospital and ER visits.  Initially agreed to palliative services but later refused.   .   Receiving home nursing and feels she has enough home support  Left wrist pain Swelling and pain.  Hasn't yet had an x-ray recommended previously  CVA Expressive aphasia is improved from CVA of several months ago.    CKD Seeing Nephrology  Relevant past medical, surgical, family and social history reviewed and updated as indicated. Interim medical history since our last visit reviewed. Allergies and medications reviewed and updated.  Review of Systems  Per HPI unless specifically indicated above     Objective:    BP (!) 89/61   Pulse (!) 108   Temp 98.8 F (37.1 C) (Oral)   Ht 5' (1.524 m)   Wt 263 lb 3.2 oz (119.4 kg)   SpO2 95%   BMI 51.40 kg/m   Wt Readings from Last 3 Encounters:  08/22/17 263 lb 3.2 oz (119.4 kg)  08/09/17 270 lb (122.5 kg)  07/24/17 263 lb (119.3 kg)    Physical Exam  Constitutional: She is oriented to person, place, and time.  HENT:  Head: Normocephalic and atraumatic.  Eyes: Conjunctivae and lids are normal. Right eye exhibits no discharge. Left eye exhibits no discharge. No scleral icterus.  Neck: Normal range of motion. Neck supple. No JVD present. Carotid bruit is not present.    Cardiovascular: Normal rate and regular rhythm.  Pulmonary/Chest: Effort normal and breath sounds normal.  Abdominal: Normal appearance. There is no splenomegaly or hepatomegaly.  Musculoskeletal: Normal range of motion.  Neurological: She is alert and oriented to person, place, and time.  Skin: Skin is warm, dry and intact. No rash noted. No pallor.  Psychiatric: She has a normal mood and affect. Her behavior is normal. Judgment and thought content normal.    Results for orders placed or performed during the hospital encounter of 65/03/54  Basic metabolic panel  Result Value Ref Range   Sodium 142 135 - 145 mmol/L   Potassium 4.5 3.5 - 5.1 mmol/L   Chloride 105 101 - 111 mmol/L   CO2 27 22 - 32 mmol/L   Glucose, Bld 138 (H) 65 - 99 mg/dL   BUN 19 6 - 20 mg/dL   Creatinine, Ser 1.68 (H) 0.44 - 1.00 mg/dL   Calcium 9.4 8.9 - 10.3 mg/dL   GFR calc non Af Amer 29 (L) >60 mL/min   GFR calc Af Amer 33 (L) >60 mL/min   Anion gap 10 5 - 15  CBC  Result Value Ref Range   WBC 6.5 3.6 - 11.0 K/uL   RBC 3.73 (L) 3.80 -  5.20 MIL/uL   Hemoglobin 11.3 (L) 12.0 - 16.0 g/dL   HCT 35.2 35.0 - 47.0 %   MCV 94.3 80.0 - 100.0 fL   MCH 30.4 26.0 - 34.0 pg   MCHC 32.3 32.0 - 36.0 g/dL   RDW 15.9 (H) 11.5 - 14.5 %   Platelets 219 150 - 440 K/uL  Troponin I  Result Value Ref Range   Troponin I <0.03 <0.03 ng/mL      Assessment & Plan:   Problem List Items Addressed This Visit      Unprioritized   Aphasia    Resolving.  Speech much more fluent today than last visit      CKD (chronic kidney disease), stage III ([place])    Seeing Nephrology      COPD (chronic obstructive pulmonary disease) ([place])    Stage is unclear per PFTs but unable to complete testing  O2 dependent.  Sees pulmonology.        Relevant Medications   tiotropium (SPIRIVA) 18 MCG inhalation capsule   Essential hypertension    Low normal BP today.  No complaints of dizzyness.  F/u 6 weeks and home monitoring through home  nursing.        Frequent hospital admissions    Will make sure [facility] is following.  Assess for palliative services.        Hand pain - Primary    Will get Sed rate, ESR, and rheumatoid factor.  ? An underlying rheumatoid/inflammatory process contributing to symptoms.        Relevant Orders   Sed Rate (ESR)   C-reactive protein   Rheumatoid Factor   Left knee pain   Relevant Orders   Sed Rate (ESR)   C-reactive protein   Rheumatoid Factor       Follow up plan: 6 weeks with Dr. Wynetta Emery  Addendum concerning high CRP, ESR, and Rheumatoid factor.  Pt started on Prednisone due to swollen left hand.  Referred to rheumatology.  Pt's husband states they will continue primary care at [facility].  Will forward this note and ask further care concerns to discuss with new primary where they have already established

## 2017-08-22 NOTE — Assessment & Plan Note (Signed)
Will get Sed rate, ESR, and rheumatoid factor.  ? An underlying rheumatoid/inflammatory process contributing to symptoms.

## 2017-08-22 NOTE — Assessment & Plan Note (Addendum)
Stage is unclear per PFTs but unable to complete testing  O2 dependent.  Sees pulmonology.

## 2017-08-22 NOTE — Assessment & Plan Note (Signed)
Low normal BP today.  No complaints of dizzyness.  F/u 6 weeks and home monitoring through home nursing.

## 2017-08-22 NOTE — Assessment & Plan Note (Signed)
Will make sure [facility] is following.  Assess for palliative services.

## 2017-08-22 NOTE — Patient Outreach (Addendum)
[place] [facility]) Care Management  08/22/2017  Katherine [name] 1940-08-12 [id number]   Referral Date: 08/22/17 Referral Source: MD Referral Referral Reason: Frequent hospitalizations   Outreach Attempt: Spoke with DPR Katherine [name] spouse. He is able to verify HIPAA.  Discussed reason for referral.  He is agreeable to services as PCP requests.  He states that they have decided to keep Katherine Haddock, NP as primary care.  He states currently patient has advanced home health PT and ST.  He states he thinks the nurse is involved as well 1-2/week.     Social: Patient lives in the home with spouse Katherine [name] who cares for patient. He states that patient needs assistance with all aspects of care.   Conditions: Patient has had previous stroke, COPD, HTN, and some renal failure per spouse.  Managing patient COPD has been a challenge and patient has had frequent hospitalizations and ED visits as a result.  Patient most recent ED visit 08-07-17.   Medications: Patient takes medications as prescribed.  No concerns with medications.   Appointments: Patient saw PCP today.   Advanced Directives: Patient does not have an advanced directive.   Consent: Discussed [facility] services.  Spouse agrees to services.    Plan: RN CM will refer to community nurse for care coordination, education and support around COPD.  Patient high risk for readmission.    Jone Baseman, RN, MSN [facility] Care Management Care Management Coordinator Direct Line [phone] Toll Free: [phone]  Fax: [phone]

## 2017-08-22 NOTE — Telephone Encounter (Signed)
Received a call from [facility] with [facility] concerned about the patient. She is needing to speak with Malachy Mood or someone regarding this patient. She states the patient has been established with [facility] and this is causing issues with them being about to help the patient. She states she had explained this before with the patients husband who said he understood.  Truddie Crumble [phone]  Thank you

## 2017-08-22 NOTE — Patient Outreach (Signed)
[place] [facility]) Care Management  08/22/2017  Katherine [name] 11/18/1940 [id number]  RN CM left message with front desk at PCP office requesting call back from [name], NP to discuss new referral for [name] and reasons case was closed in June.   Gali Spinney E. Rollene Rotunda RN, BSN [facility] Care Management Coordinator [phone]

## 2017-08-22 NOTE — Assessment & Plan Note (Signed)
Resolving.  Speech much more fluent today than last visit

## 2017-08-23 ENCOUNTER — Telehealth: Payer: Self-pay | Admitting: Unknown Physician Specialty

## 2017-08-23 ENCOUNTER — Other Ambulatory Visit: Payer: Self-pay

## 2017-08-23 LAB — C-REACTIVE PROTEIN: CRP: 38 mg/L — ABNORMAL HIGH (ref 0–10)

## 2017-08-23 LAB — RHEUMATOID FACTOR: Rhuematoid fact SerPl-aCnc: 367.6 IU/mL — ABNORMAL HIGH (ref 0.0–13.9)

## 2017-08-23 LAB — SEDIMENTATION RATE: Sed Rate: 91 mm/hr — ABNORMAL HIGH (ref 0–40)

## 2017-08-23 IMAGING — CT CT ANGIO CHEST
1 of 2 series · 18 of 30 positions shown · IV contrast (APPLIED)
Comparison: CTA chest dated 09/16/2014

CLINICAL DATA: Shortness of breath x2 days, COPD.

EXAM:
CT ANGIOGRAPHY CHEST WITH CONTRAST
TECHNIQUE: Multidetector CT imaging of the chest was performed using the
standard protocol during bolus administration of intravenous
contrast. Multiplanar CT image reconstructions and MIPs were
obtained to evaluate the vascular anatomy.
CONTRAST:  75mL OMNIPAQUE IOHEXOL 350 MG/ML SOLN

[Series 5: pe 1.0 thins · axial · 0.66mm/px · z∈[-338,-98]mm · 18 of 273 slices shown]
[im 16/273  lung]
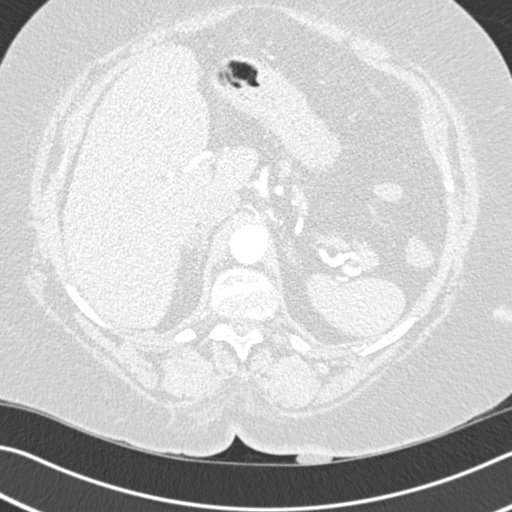
[im 31/273  mediastinal]
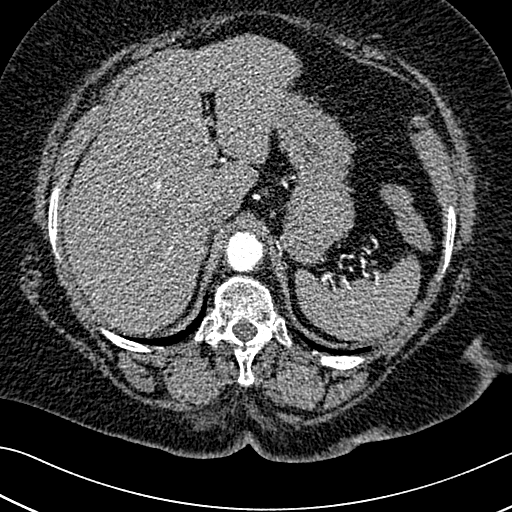
[im 46/273  lung]
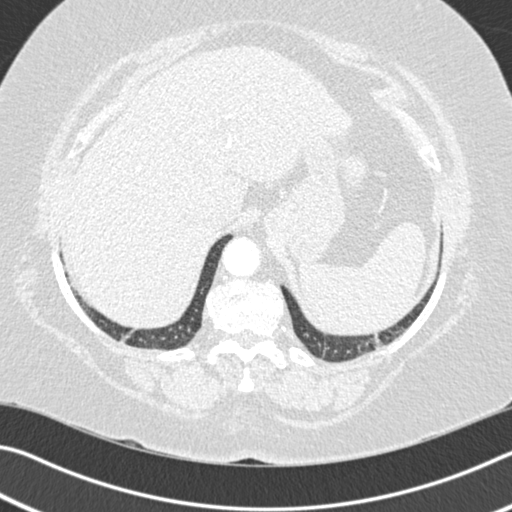
[im 61/273  mediastinal]
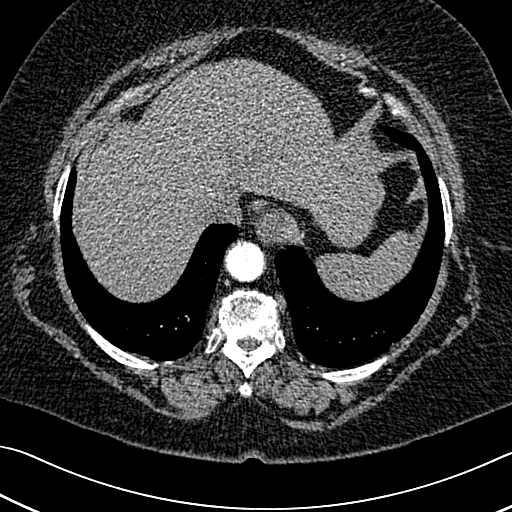
[im 76/273  lung]
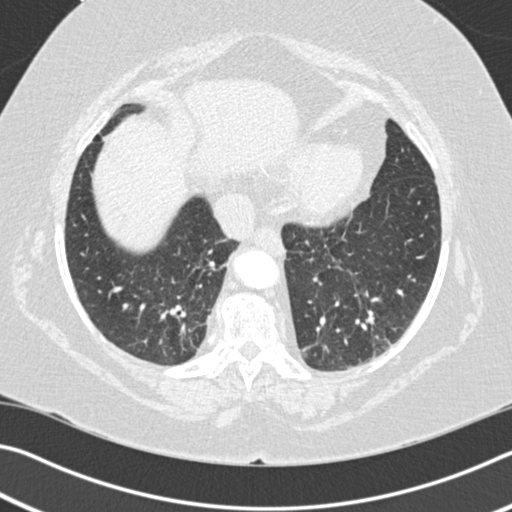
[im 91/273  mediastinal]
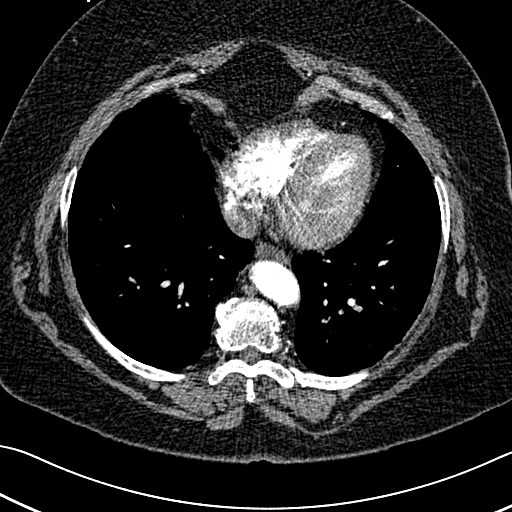
[im 106/273  lung]
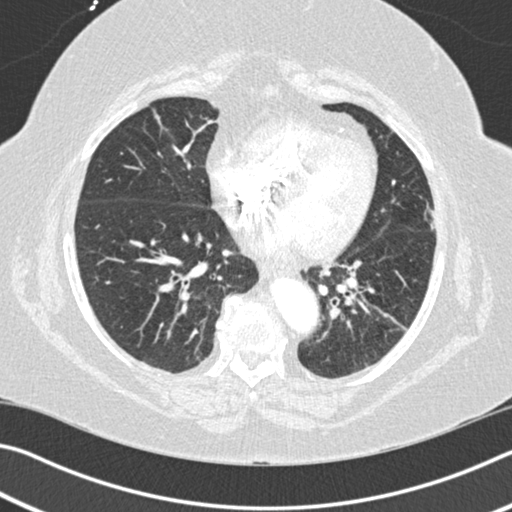
[im 121/273  mediastinal]
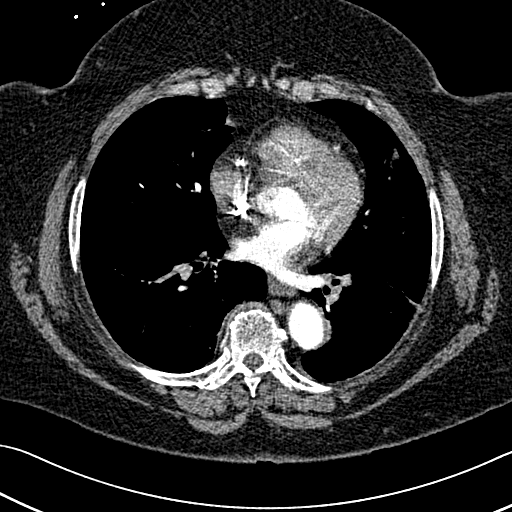
[im 127/273  lung]
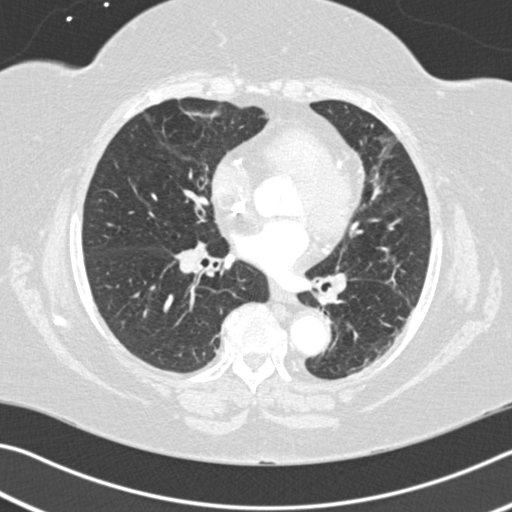
[im 137/273  mediastinal]
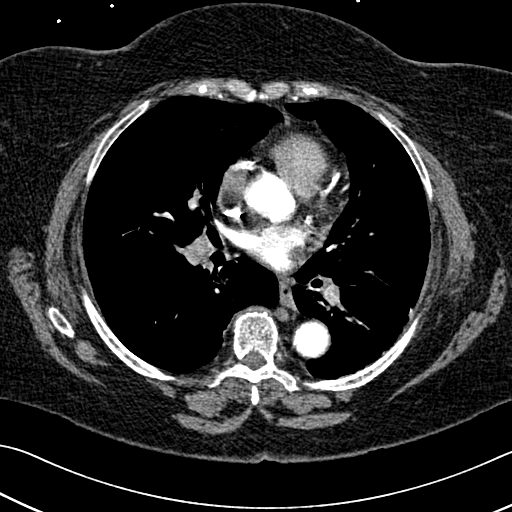
[im 152/273  lung]
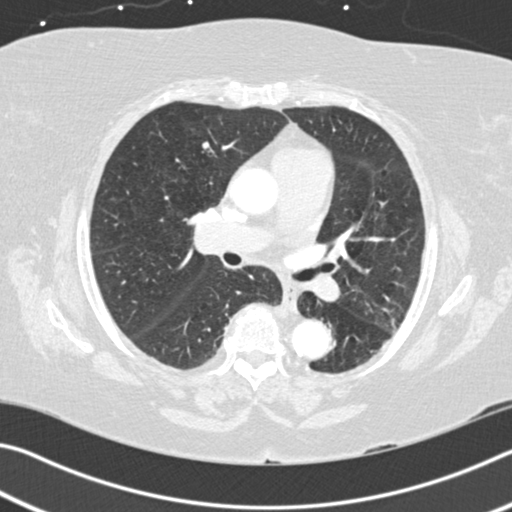
[im 167/273  mediastinal]
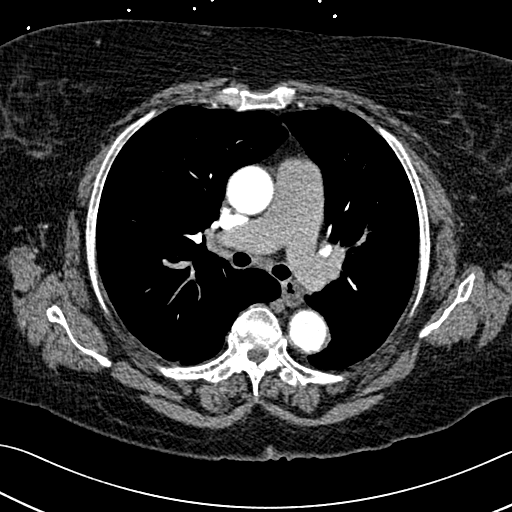
[im 182/273  lung]
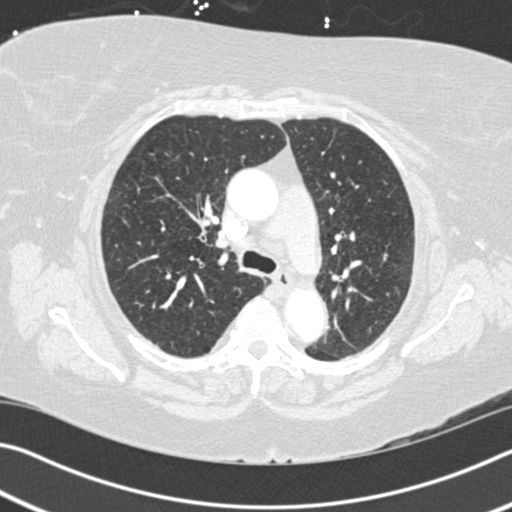
[im 197/273  mediastinal]
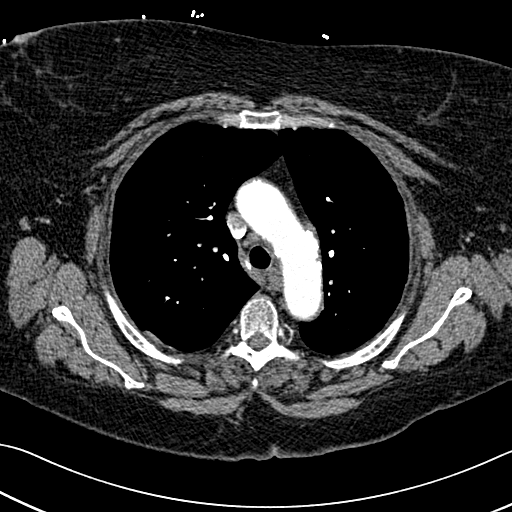
[im 212/273  lung]
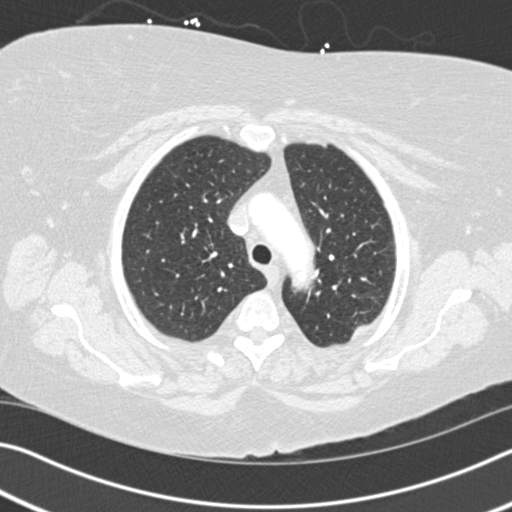
[im 227/273  mediastinal]
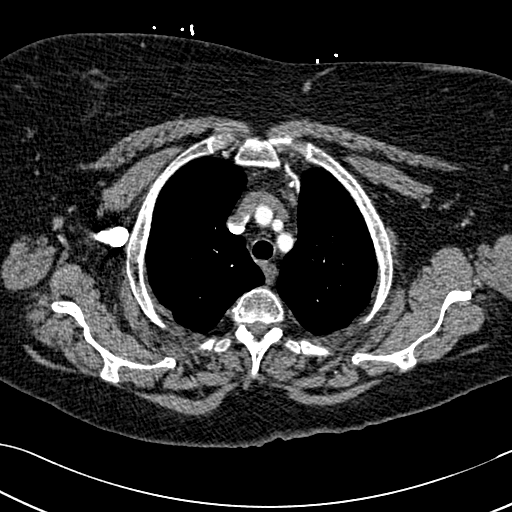
[im 242/273  lung]
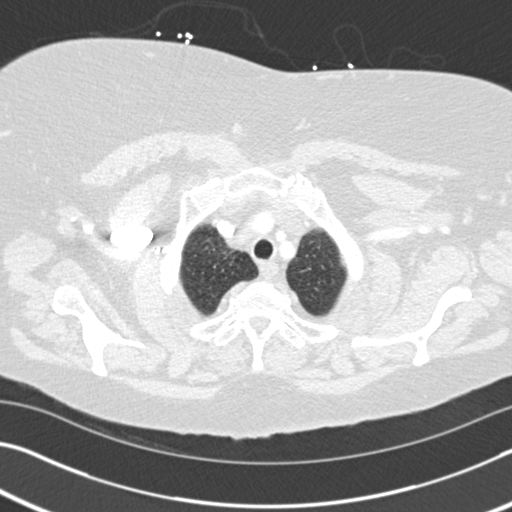
[im 257/273  mediastinal]
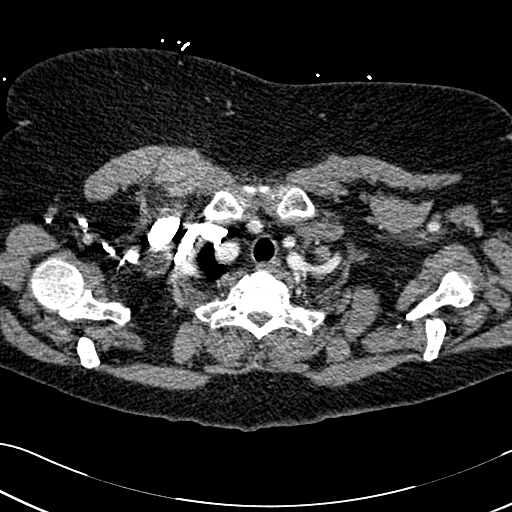

[18 of 30 positions shown; findings below may reference images not displayed]

FINDINGS: Suboptimal/nondiagnostic evaluation for pulmonary embolism. Contrast
is within the aorta and pulmonary veins at the time of imaging, but
not within the pulmonary arteries. Given the patient's impaired
renal function, repeat contrast bolus could not be administered due
medially. Following discussion with the referring clinician, the
patient will be hydrated and repeat imaging will be performed after
24 hours.

Mediastinum/Nodes: Heart is normal in size. No pericardial effusion.

Three vessel coronary atherosclerosis.

Atherosclerotic calcifications of the aortic arch.

Small mediastinal lymph nodes, including a 10 mm short axis AP
window node (series 4/image 45), likely reactive.

Visualized thyroid is unremarkable.

Lungs/Pleura: Mild patchy/ platelike opacity in the left upper
lobe/lingula (series 6/image 78), similar to prior CT, favored to
reflect scarring or less likely atelectasis. Additional linear
scarring/ atelectasis in the right middle lobe and left lower lobe.

Underlying mild centrilobular and paraseptal emphysematous changes.

No focal consolidation.

No suspicious pulmonary nodules.

No pleural effusion or pneumothorax.

Upper abdomen: Visualized upper abdomen is notable for
cholecystectomy clips.

Musculoskeletal: Degenerative changes of the visualized
thoracolumbar spine.

Review of the MIP images confirms the above findings.
IMPRESSION: Suboptimal/nondiagnostic evaluation pulmonary embolism, without
contrast in the pulmonary arteries at the time of imaging. Following
discussion with the referring clinician, given the patient's
impaired renal function, repeat contrast-enhanced imaging will be
performed after 24 hours.

Otherwise, no evidence of acute cardiopulmonary disease.

## 2017-08-23 NOTE — Telephone Encounter (Signed)
Tried calling patient and/or her husband to get clarification on this situation. Not sure if the patient is planning to stay here at [facility] or if she is transferring care to [facility]. Did not get an answer with the call and could not leave a VM as box was full. Will try to call again later.

## 2017-08-23 NOTE — Telephone Encounter (Signed)
Copied from [place] [id number]. Topic: General - Other >> Aug 23, 2017  9:59 AM Yvette Rack wrote: Reason for CRM: Raquel Sarna Pt @ [phone] calling for verbal orders from [facility] once a week for 1 week  twice a week for 3 weeks  once a week for 1 week for PT

## 2017-08-23 NOTE — Patient Outreach (Signed)
Katherine [name] Katherine [name]) Care Management  08/23/2017  Katherine [name] 1940/11/10 [id number]  Care Coordination: Successful outreach attempt to the Katherine [name], the above patients husband and contact listed on the [name] Consent. RN CM reintroduced herself as she is known briefly by the patient and husband from previous Community CM care. Patient was discharged from [facility] in June related to choosing a new PCP that is not in the [place].  Mr. Katherine [name] stated patient is having a good day. She is currently sleeping. Prior to RN CM discussing re-establishment of community CM services, it was clarified that patient had re-established primary care services with Katherine Haddock, NP. Mr. Katherine [name] replied yes but that patient would also continue to see the doctor at [facility] and has an appointment in a couple of months. RN CM clarified that Mr. Katherine [name] was speaking of the PCP at [facility]. RN CM discussed with Mr. Katherine [name] that it was in the patients best interest for continuity of care to stick with one primary care provider. Mr. Katherine [name] stated he did not previously understand that but if she had to choose one primary care provider it would be with [facility]. He states choosing a [facility] provider was a decision driven by the patients son, Katherine [name], however he and the patient agreed related to this being where patient seeks most of her care. RN CM reinforced to Mr. Katherine [name] that patient is not eligible for [name] services related to her having a [facility] PCP. Mr. Katherine [name] verbalized understanding. He requested that I call his son with this information.  RN CM spoke with patients son, Katherine [name] as requested by patient's husband. RN CM discussed with son information documented above. Son unaware that patient was reestablished by husband with Katherine Haddock, NP as evidenced by appointment yesterday for left hand pain and that [name] was the contact PCP for [name] services orders. Son states that he will discuss and  reinforce with patient and husband that patient is to seek all primary care with [facility] [place].  Plan: Case will be closed related to lack of eligibility for [facility] services.  Katherine Greenleaf E. Rollene Rotunda RN, BSN [facility] Care Management Coordinator [phone]

## 2017-08-23 NOTE — Telephone Encounter (Addendum)
[name] calling back, requesting call back [phone]. States husband and son state patient will stay with [facility].

## 2017-08-23 NOTE — Telephone Encounter (Signed)
Spoke with Amy RN and per note per Malachy Mood this was ok'd 08/14/2017.

## 2017-08-23 NOTE — Telephone Encounter (Signed)
Verbal orders given  

## 2017-08-24 ENCOUNTER — Other Ambulatory Visit: Payer: Self-pay | Admitting: Unknown Physician Specialty

## 2017-08-24 DIAGNOSIS — R768 Other specified abnormal immunological findings in serum: Secondary | ICD-10-CM

## 2017-08-24 DIAGNOSIS — R700 Elevated erythrocyte sedimentation rate: Secondary | ICD-10-CM

## 2017-08-24 DIAGNOSIS — R7982 Elevated C-reactive protein (CRP): Secondary | ICD-10-CM

## 2017-08-24 IMAGING — CT CT ANGIO CHEST
1 of 2 series · 18 of 30 positions shown · IV contrast (APPLIED)
Comparison: CT chest dated 02/11/2015 and 09/16/2014

CLINICAL DATA: Shortness of breath, repeat PE study due to
nondiagnostic examination yesterday

EXAM:
CT ANGIOGRAPHY CHEST WITH CONTRAST
TECHNIQUE: Multidetector CT imaging of the chest was performed using the
standard protocol during bolus administration of intravenous
contrast. Multiplanar CT image reconstructions and MIPs were
obtained to evaluate the vascular anatomy.
CONTRAST:  75mL OMNIPAQUE IOHEXOL 350 MG/ML SOLN

[Series 5: pe 1.0 thins · axial · 0.59mm/px · z∈[-817,-576]mm · 18 of 273 slices shown]
[im 16/273  lung]
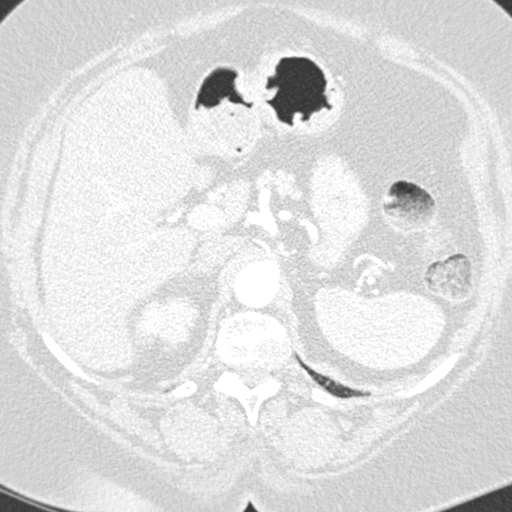
[im 31/273  mediastinal]
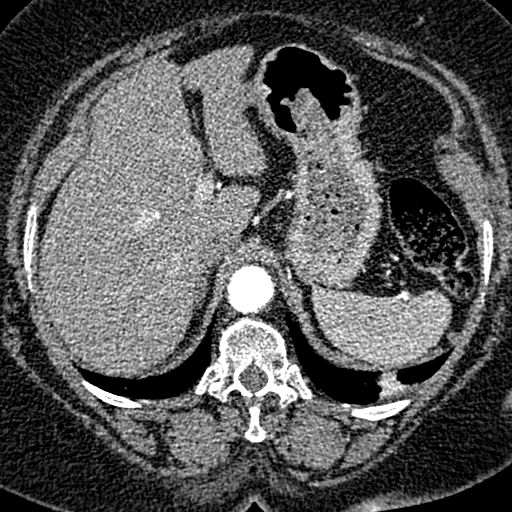
[im 46/273  lung]
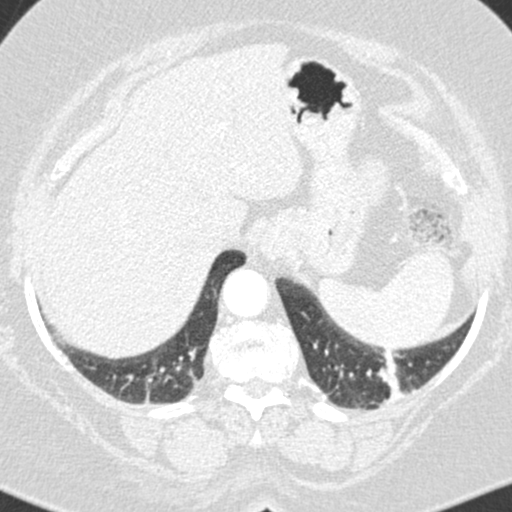
[im 61/273  mediastinal]
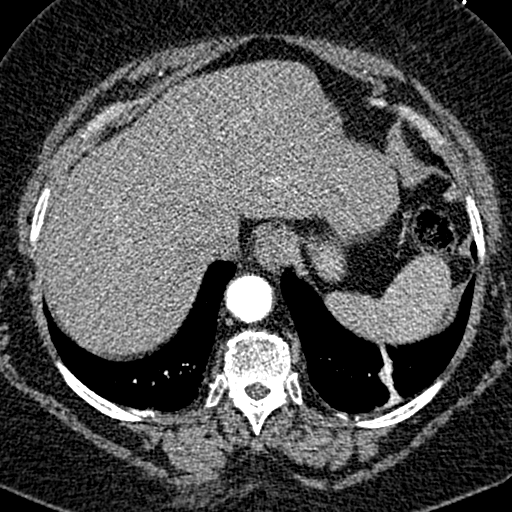
[im 76/273  lung]
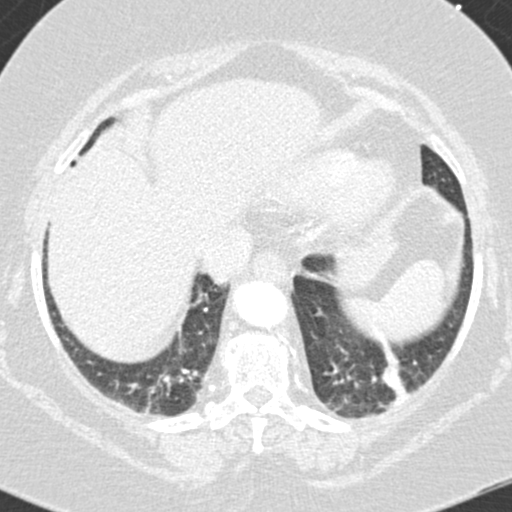
[im 91/273  mediastinal]
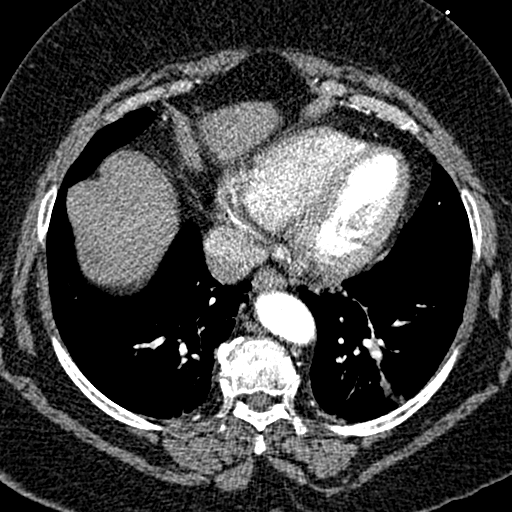
[im 106/273  lung]
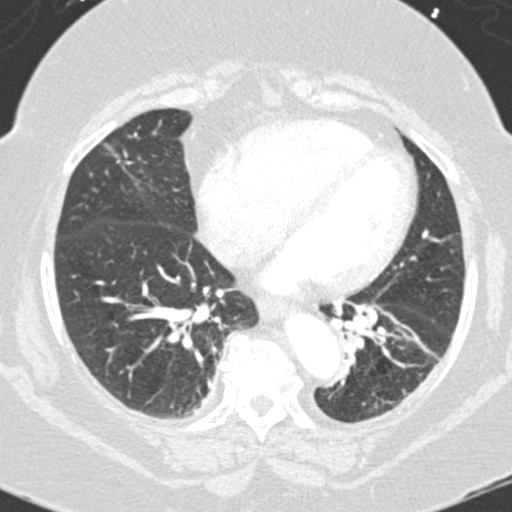
[im 121/273  mediastinal]
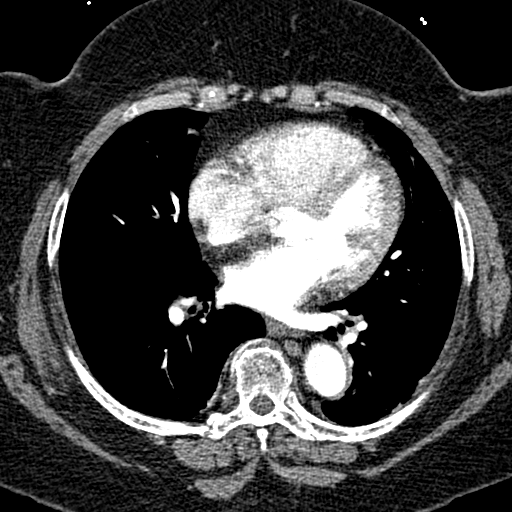
[im 127/273  lung]
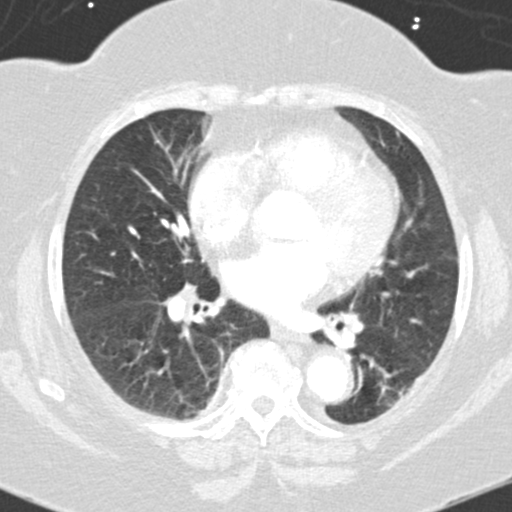
[im 137/273  mediastinal]
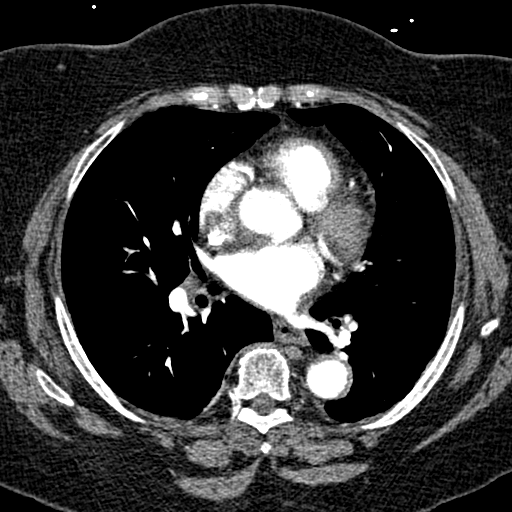
[im 152/273  lung]
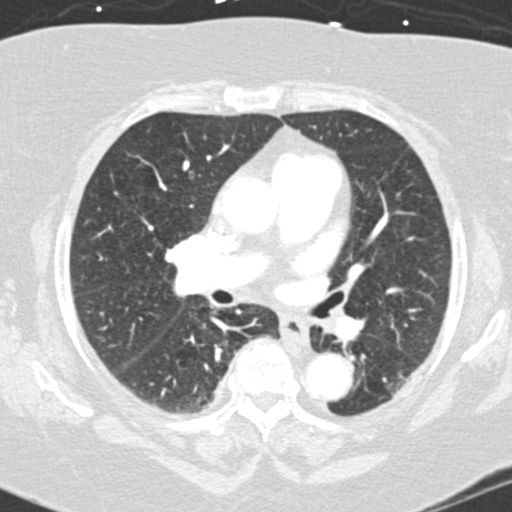
[im 167/273  mediastinal]
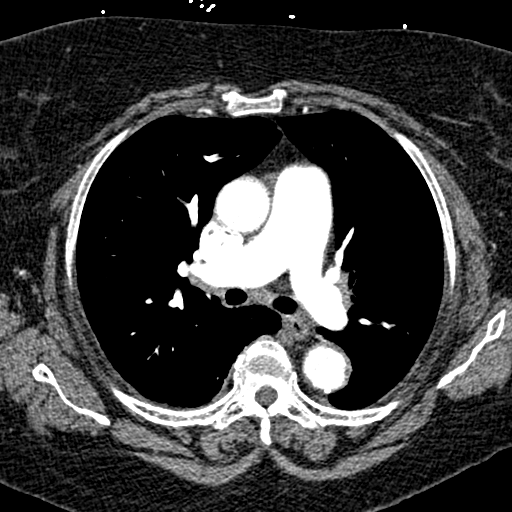
[im 182/273  lung]
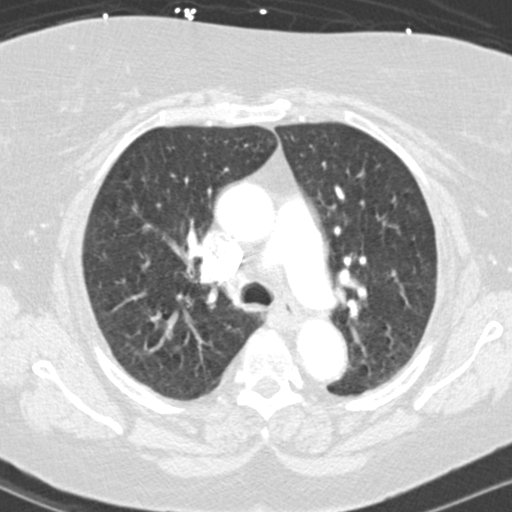
[im 197/273  mediastinal]
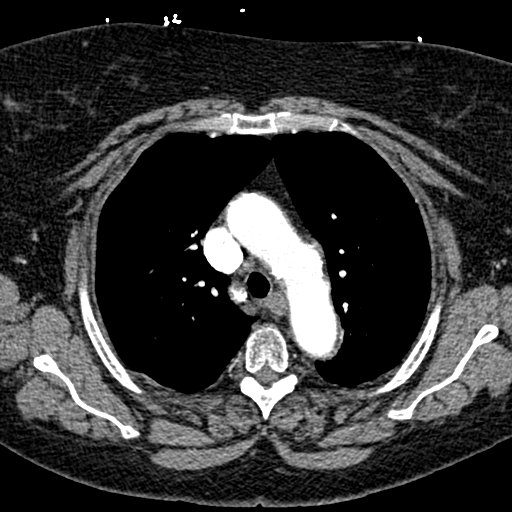
[im 212/273  lung]
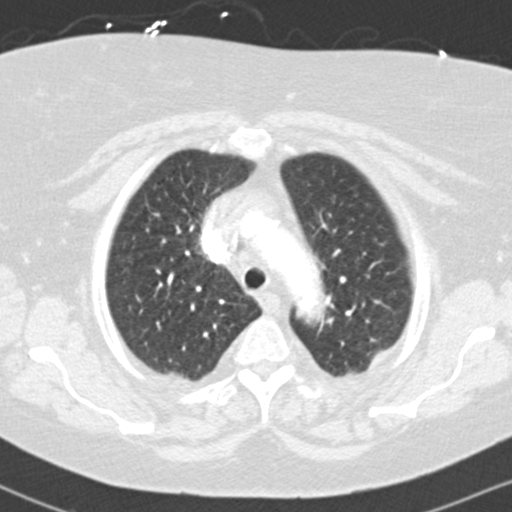
[im 227/273  mediastinal]
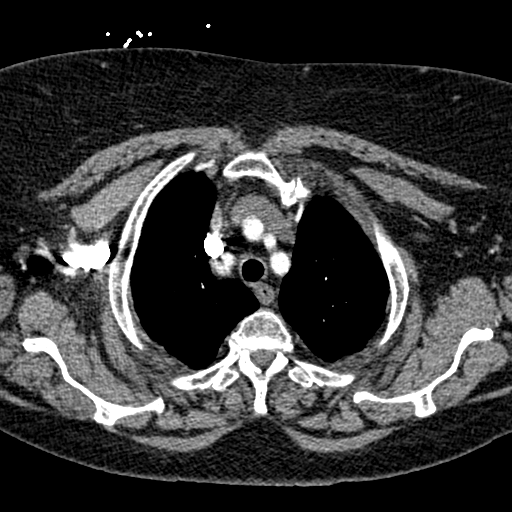
[im 242/273  lung]
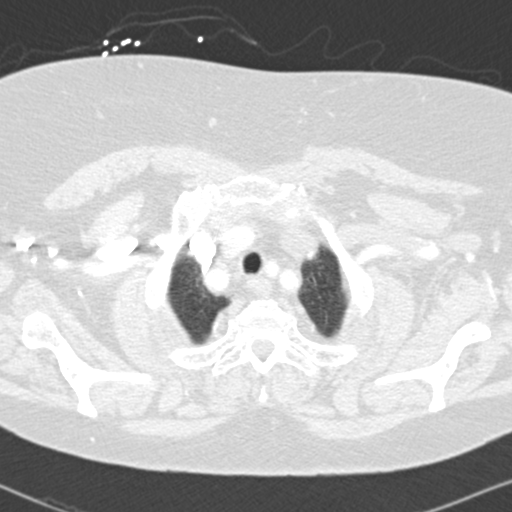
[im 257/273  mediastinal]
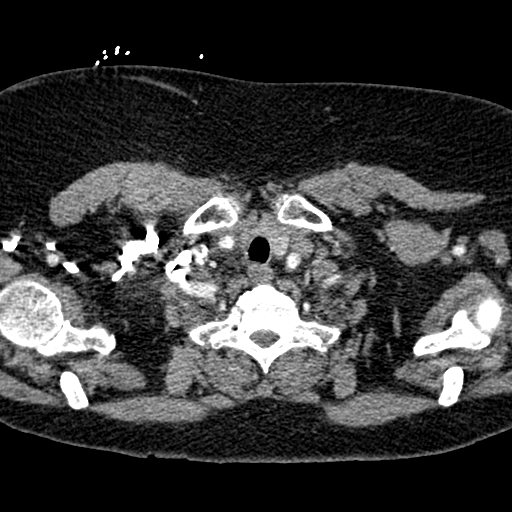

[18 of 30 positions shown; findings below may reference images not displayed]

FINDINGS: No evidence of pulmonary embolism.

Mediastinum/Nodes: The heart is normal in size. No pericardial
effusion.

Coronary atherosclerosis.

Atherosclerotic calcifications aortic arch.

Small mediastinal lymph nodes measuring up to 10 mm short axis.

Visualized thyroid is unremarkable.

Lungs/Pleura: Scattered areas of scarring in the medial right middle
lobe, lingula, and bilateral lower lobes.

No focal consolidation.

Evaluation of the lung parenchyma is constrained by respiratory
motion. No suspicious pulmonary nodules.

Underlying mild to moderate centrilobular and paraseptal
emphysematous changes.

No pleural effusion or pneumothorax.

Upper abdomen: Visualized upper abdomen is notable for
cholecystectomy clips.

Musculoskeletal: Degenerative changes of the visualized
thoracolumbar spine. Mild wedging of a mid thoracic vertebral body.

Review of the MIP images confirms the above findings.
IMPRESSION: No evidence of pulmonary embolism.

No evidence of acute cardiopulmonary disease.

## 2017-08-24 MED ORDER — PREDNISONE 10 MG PO TABS: ORAL_TABLET | ORAL | 0 refills | Status: DC

## 2017-08-24 NOTE — Progress Notes (Signed)
Agree with below. Addendum:    I believe that she has limits with moving and including, toileting, bathing, feeding, dressing and grooming. I believe the power wheelchair is needed for pt to be able to perform ADL's in her home

## 2017-08-24 NOTE — Progress Notes (Unsigned)
Rx for Prednisone to help with hand swelling and pain.  CRP, ESR, and Rheumatoid factor markedly elevated.  Will also refer to rheumatology.  Urgent referral as pt chronically ill with multiple pulmonary exacerbations requiring hospitalization.

## 2017-08-24 NOTE — Telephone Encounter (Signed)
Called and let Raquel Sarna a VM letting her know that Malachy Mood said orders were ok.

## 2017-08-24 NOTE — Telephone Encounter (Signed)
Thanks, just make sure any further orders from home care goes to them.

## 2017-08-24 NOTE — Telephone Encounter (Signed)
Called and spoke to Dewy Rose. She states that the patient is going to stay with [facility] so she has closed her case with [facility] because when the patient established care with [facility], they can no longer see her. Truddie Crumble states that she wanted to make sure that Malachy Mood understands that the patient has re-established with [place] as her PCP.

## 2017-08-25 DIAGNOSIS — I10 Essential (primary) hypertension: Secondary | ICD-10-CM | POA: Diagnosis not present

## 2017-08-25 DIAGNOSIS — R0600 Dyspnea, unspecified: Secondary | ICD-10-CM | POA: Diagnosis not present

## 2017-08-25 DIAGNOSIS — I69391 Dysphagia following cerebral infarction: Secondary | ICD-10-CM | POA: Diagnosis not present

## 2017-08-25 DIAGNOSIS — Z87891 Personal history of nicotine dependence: Secondary | ICD-10-CM | POA: Diagnosis not present

## 2017-08-25 DIAGNOSIS — J449 Chronic obstructive pulmonary disease, unspecified: Secondary | ICD-10-CM | POA: Diagnosis not present

## 2017-08-25 DIAGNOSIS — R6883 Chills (without fever): Secondary | ICD-10-CM | POA: Diagnosis not present

## 2017-08-25 DIAGNOSIS — R072 Precordial pain: Secondary | ICD-10-CM | POA: Diagnosis not present

## 2017-08-25 DIAGNOSIS — I499 Cardiac arrhythmia, unspecified: Secondary | ICD-10-CM | POA: Diagnosis not present

## 2017-08-25 DIAGNOSIS — I69322 Dysarthria following cerebral infarction: Secondary | ICD-10-CM | POA: Diagnosis not present

## 2017-08-25 DIAGNOSIS — R471 Dysarthria and anarthria: Secondary | ICD-10-CM | POA: Diagnosis not present

## 2017-08-25 DIAGNOSIS — R0602 Shortness of breath: Secondary | ICD-10-CM | POA: Diagnosis not present

## 2017-08-25 DIAGNOSIS — R600 Localized edema: Secondary | ICD-10-CM | POA: Diagnosis not present

## 2017-08-25 DIAGNOSIS — R079 Chest pain, unspecified: Secondary | ICD-10-CM | POA: Diagnosis not present

## 2017-08-25 DIAGNOSIS — R51 Headache: Secondary | ICD-10-CM | POA: Diagnosis not present

## 2017-08-27 ENCOUNTER — Inpatient Hospital Stay
Admission: EM | Admit: 2017-08-27 | Discharge: 2017-08-29 | DRG: 915 | Disposition: A | Discharge status: Home Health | Payer: Medicare HMO | Attending: Internal Medicine | Admitting: Internal Medicine

## 2017-08-27 ENCOUNTER — Other Ambulatory Visit: Payer: Self-pay

## 2017-08-27 ENCOUNTER — Emergency Department: Payer: Medicare HMO

## 2017-08-27 DIAGNOSIS — R0602 Shortness of breath: Secondary | ICD-10-CM | POA: Diagnosis not present

## 2017-08-27 DIAGNOSIS — I69322 Dysarthria following cerebral infarction: Secondary | ICD-10-CM | POA: Diagnosis not present

## 2017-08-27 DIAGNOSIS — I129 Hypertensive chronic kidney disease with stage 1 through stage 4 chronic kidney disease, or unspecified chronic kidney disease: Secondary | ICD-10-CM | POA: Diagnosis present

## 2017-08-27 DIAGNOSIS — F329 Major depressive disorder, single episode, unspecified: Secondary | ICD-10-CM | POA: Diagnosis present

## 2017-08-27 DIAGNOSIS — I517 Cardiomegaly: Secondary | ICD-10-CM | POA: Diagnosis present

## 2017-08-27 DIAGNOSIS — Z6841 Body Mass Index (BMI) 40.0 and over, adult: Secondary | ICD-10-CM | POA: Diagnosis not present

## 2017-08-27 DIAGNOSIS — Z789 Other specified health status: Secondary | ICD-10-CM | POA: Diagnosis not present

## 2017-08-27 DIAGNOSIS — Z87891 Personal history of nicotine dependence: Secondary | ICD-10-CM

## 2017-08-27 DIAGNOSIS — J441 Chronic obstructive pulmonary disease with (acute) exacerbation: Secondary | ICD-10-CM | POA: Diagnosis present

## 2017-08-27 DIAGNOSIS — E875 Hyperkalemia: Secondary | ICD-10-CM | POA: Diagnosis present

## 2017-08-27 DIAGNOSIS — G4733 Obstructive sleep apnea (adult) (pediatric): Secondary | ICD-10-CM | POA: Diagnosis present

## 2017-08-27 DIAGNOSIS — M25531 Pain in right wrist: Secondary | ICD-10-CM | POA: Diagnosis present

## 2017-08-27 DIAGNOSIS — J9811 Atelectasis: Secondary | ICD-10-CM | POA: Diagnosis present

## 2017-08-27 DIAGNOSIS — Z79899 Other long term (current) drug therapy: Secondary | ICD-10-CM

## 2017-08-27 DIAGNOSIS — M25532 Pain in left wrist: Secondary | ICD-10-CM | POA: Diagnosis present

## 2017-08-27 DIAGNOSIS — E872 Acidosis: Secondary | ICD-10-CM | POA: Diagnosis not present

## 2017-08-27 DIAGNOSIS — N179 Acute kidney failure, unspecified: Secondary | ICD-10-CM | POA: Diagnosis not present

## 2017-08-27 DIAGNOSIS — N183 Chronic kidney disease, stage 3 (moderate): Secondary | ICD-10-CM | POA: Diagnosis present

## 2017-08-27 DIAGNOSIS — J9621 Acute and chronic respiratory failure with hypoxia: Secondary | ICD-10-CM | POA: Diagnosis present

## 2017-08-27 DIAGNOSIS — Z91030 Bee allergy status: Secondary | ICD-10-CM

## 2017-08-27 DIAGNOSIS — K219 Gastro-esophageal reflux disease without esophagitis: Secondary | ICD-10-CM | POA: Diagnosis present

## 2017-08-27 DIAGNOSIS — T7840XA Allergy, unspecified, initial encounter: Secondary | ICD-10-CM | POA: Diagnosis not present

## 2017-08-27 DIAGNOSIS — F419 Anxiety disorder, unspecified: Secondary | ICD-10-CM | POA: Diagnosis present

## 2017-08-27 DIAGNOSIS — M1990 Unspecified osteoarthritis, unspecified site: Secondary | ICD-10-CM | POA: Diagnosis present

## 2017-08-27 DIAGNOSIS — Z888 Allergy status to other drugs, medicaments and biological substances status: Secondary | ICD-10-CM

## 2017-08-27 DIAGNOSIS — Z7902 Long term (current) use of antithrombotics/antiplatelets: Secondary | ICD-10-CM

## 2017-08-27 DIAGNOSIS — G9340 Encephalopathy, unspecified: Secondary | ICD-10-CM | POA: Diagnosis present

## 2017-08-27 DIAGNOSIS — T783XXA Angioneurotic edema, initial encounter: Secondary | ICD-10-CM | POA: Diagnosis not present

## 2017-08-27 DIAGNOSIS — R471 Dysarthria and anarthria: Secondary | ICD-10-CM | POA: Diagnosis present

## 2017-08-27 DIAGNOSIS — Z7951 Long term (current) use of inhaled steroids: Secondary | ICD-10-CM

## 2017-08-27 DIAGNOSIS — T7849XA Other allergy, initial encounter: Secondary | ICD-10-CM | POA: Diagnosis not present

## 2017-08-27 DIAGNOSIS — J9622 Acute and chronic respiratory failure with hypercapnia: Secondary | ICD-10-CM | POA: Diagnosis not present

## 2017-08-27 DIAGNOSIS — T424X5A Adverse effect of benzodiazepines, initial encounter: Secondary | ICD-10-CM | POA: Diagnosis present

## 2017-08-27 DIAGNOSIS — Z96651 Presence of right artificial knee joint: Secondary | ICD-10-CM | POA: Diagnosis present

## 2017-08-27 LAB — BLOOD GAS, VENOUS
ACID-BASE EXCESS: 5.3 mmol/L — AB (ref 0.0–2.0)
BICARBONATE: 33.5 mmol/L — AB (ref 20.0–28.0)
PATIENT TEMPERATURE: 37
PCO2 VEN: 68 mmHg — AB (ref 44.0–60.0)
PH VEN: 7.3 (ref 7.250–7.430)
pO2, Ven: <31 mmHg — CL (ref 32.0–45.0)

## 2017-08-27 LAB — CBC WITH DIFFERENTIAL/PLATELET
BASOS ABS: 0.1 10*3/uL (ref 0–0.1)
BASOS PCT: 1 %
EOS ABS: 0.1 10*3/uL (ref 0–0.7)
EOS PCT: 1 %
HCT: 34.4 % — ABNORMAL LOW (ref 35.0–47.0)
HEMOGLOBIN: 10.9 g/dL — AB (ref 12.0–16.0)
Lymphocytes Relative: 14 %
Lymphs Abs: 1.1 10*3/uL (ref 1.0–3.6)
MCH: 28.4 pg (ref 26.0–34.0)
MCHC: 31.8 g/dL — ABNORMAL LOW (ref 32.0–36.0)
MCV: 89.1 fL (ref 80.0–100.0)
Monocytes Absolute: 0.5 10*3/uL (ref 0.2–0.9)
Monocytes Relative: 7 %
NEUTROS PCT: 77 %
Neutro Abs: 6.1 10*3/uL (ref 1.4–6.5)
PLATELETS: 325 10*3/uL (ref 150–440)
RBC: 3.86 MIL/uL (ref 3.80–5.20)
RDW: 16.4 % — ABNORMAL HIGH (ref 11.5–14.5)
WBC: 7.9 10*3/uL (ref 3.6–11.0)

## 2017-08-27 LAB — COMPREHENSIVE METABOLIC PANEL
ALBUMIN: 3.2 g/dL — AB (ref 3.5–5.0)
ALK PHOS: 66 U/L (ref 38–126)
ALT: 16 U/L (ref 0–44)
AST: 18 U/L (ref 15–41)
Anion gap: 8 (ref 5–15)
BUN: 33 mg/dL — ABNORMAL HIGH (ref 8–23)
CALCIUM: 9.1 mg/dL (ref 8.9–10.3)
CHLORIDE: 104 mmol/L (ref 98–111)
CO2: 30 mmol/L (ref 22–32)
CREATININE: 2.17 mg/dL — AB (ref 0.44–1.00)
GFR calc Af Amer: 24 mL/min — ABNORMAL LOW (ref >60)
GFR calc non Af Amer: 21 mL/min — ABNORMAL LOW (ref >60)
GLUCOSE: 105 mg/dL — AB (ref 70–99)
Potassium: 5.5 mmol/L — ABNORMAL HIGH (ref 3.5–5.1)
SODIUM: 142 mmol/L (ref 135–145)
Total Bilirubin: 0.4 mg/dL (ref 0.3–1.2)
Total Protein: 7.4 g/dL (ref 6.5–8.1)

## 2017-08-27 LAB — MRSA PCR SCREENING: MRSA BY PCR: NEGATIVE

## 2017-08-27 LAB — BASIC METABOLIC PANEL
Anion gap: 9 (ref 5–15)
BUN: 34 mg/dL — ABNORMAL HIGH (ref 8–23)
CALCIUM: 9.1 mg/dL (ref 8.9–10.3)
CO2: 28 mmol/L (ref 22–32)
Chloride: 104 mmol/L (ref 98–111)
Creatinine, Ser: 2.03 mg/dL — ABNORMAL HIGH (ref 0.44–1.00)
GFR calc Af Amer: 26 mL/min — ABNORMAL LOW (ref >60)
GFR, EST NON AFRICAN AMERICAN: 23 mL/min — AB (ref >60)
GLUCOSE: 145 mg/dL — AB (ref 70–99)
Potassium: 5.6 mmol/L — ABNORMAL HIGH (ref 3.5–5.1)
Sodium: 141 mmol/L (ref 135–145)

## 2017-08-27 LAB — CK: CK TOTAL: 59 U/L (ref 38–234)

## 2017-08-27 LAB — GLUCOSE, CAPILLARY: Glucose-Capillary: 138 mg/dL — ABNORMAL HIGH (ref 70–99)

## 2017-08-27 LAB — TROPONIN I: Troponin I: <0.03 ng/mL (ref <0.03)

## 2017-08-27 MED ORDER — ONDANSETRON HCL 4 MG/2ML IJ SOLN: 4.0000 mg | Freq: Four times a day (QID) | INTRAMUSCULAR | Status: DC | PRN

## 2017-08-27 MED ORDER — METHYLPREDNISOLONE SODIUM SUCC 125 MG IJ SOLR
60.0000 mg | Freq: Four times a day (QID) | INTRAMUSCULAR | Status: DC
  Administered 2017-08-27 – 2017-08-29 (×7): 60 mg via INTRAVENOUS
  Filled 2017-08-27 (×8): qty 2

## 2017-08-27 MED ORDER — CLOPIDOGREL BISULFATE 75 MG PO TABS
75.0000 mg | ORAL_TABLET | Freq: Every day | ORAL | Status: DC
  Administered 2017-08-27 – 2017-08-29 (×3): 75 mg via ORAL
  Filled 2017-08-27 (×2): qty 1

## 2017-08-27 MED ORDER — DILTIAZEM HCL ER COATED BEADS 120 MG PO CP24
120.0000 mg | ORAL_CAPSULE | Freq: Every day | ORAL | Status: DC
  Administered 2017-08-28 – 2017-08-29 (×2): 120 mg via ORAL
  Filled 2017-08-27 (×3): qty 1

## 2017-08-27 MED ORDER — DIPHENHYDRAMINE HCL 50 MG/ML IJ SOLN
25.0000 mg | Freq: Four times a day (QID) | INTRAMUSCULAR | Status: DC | PRN
  Administered 2017-08-27: 25 mg via INTRAVENOUS

## 2017-08-27 MED ORDER — ACETAMINOPHEN 325 MG PO TABS
650.0000 mg | ORAL_TABLET | Freq: Four times a day (QID) | ORAL | Status: DC | PRN
  Administered 2017-08-27: 650 mg via ORAL
  Filled 2017-08-27: qty 2

## 2017-08-27 MED ORDER — IPRATROPIUM-ALBUTEROL 0.5-2.5 (3) MG/3ML IN SOLN
3.0000 mL | Freq: Four times a day (QID) | RESPIRATORY_TRACT | Status: DC
  Administered 2017-08-27 – 2017-08-29 (×7): 3 mL via RESPIRATORY_TRACT
  Filled 2017-08-27 (×8): qty 3

## 2017-08-27 MED ORDER — POLYETHYLENE GLYCOL 3350 17 G PO PACK: 17.0000 g | PACK | Freq: Every day | ORAL | Status: DC | PRN

## 2017-08-27 MED ORDER — ALBUTEROL SULFATE (2.5 MG/3ML) 0.083% IN NEBU: 2.5000 mg | INHALATION_SOLUTION | RESPIRATORY_TRACT | Status: DC | PRN

## 2017-08-27 MED ORDER — DIPHENHYDRAMINE HCL 50 MG/ML IJ SOLN
25.0000 mg | Freq: Three times a day (TID) | INTRAMUSCULAR | Status: DC
  Administered 2017-08-28 – 2017-08-29 (×4): 25 mg via INTRAVENOUS
  Filled 2017-08-27 (×5): qty 1

## 2017-08-27 MED ORDER — ONDANSETRON HCL 4 MG PO TABS: 4.0000 mg | ORAL_TABLET | Freq: Four times a day (QID) | ORAL | Status: DC | PRN

## 2017-08-27 MED ORDER — HEPARIN SODIUM (PORCINE) 5000 UNIT/ML IJ SOLN
5000.0000 [IU] | Freq: Three times a day (TID) | INTRAMUSCULAR | Status: DC
  Administered 2017-08-27 – 2017-08-28 (×4): 5000 [IU] via SUBCUTANEOUS
  Filled 2017-08-27 (×5): qty 1

## 2017-08-27 MED ORDER — LORAZEPAM 2 MG/ML IJ SOLN
0.5000 mg | Freq: Once | INTRAMUSCULAR | Status: AC
  Administered 2017-08-27: 0.5 mg via INTRAVENOUS

## 2017-08-27 MED ORDER — LORAZEPAM 2 MG/ML IJ SOLN
INTRAMUSCULAR | Status: AC
  Filled 2017-08-27: qty 1

## 2017-08-27 MED ORDER — FLUOXETINE HCL 10 MG PO CAPS
10.0000 mg | ORAL_CAPSULE | Freq: Every day | ORAL | Status: DC
  Administered 2017-08-28: 10 mg via ORAL
  Filled 2017-08-27 (×2): qty 1

## 2017-08-27 MED ORDER — METHYLPREDNISOLONE SODIUM SUCC 125 MG IJ SOLR
125.0000 mg | Freq: Once | INTRAMUSCULAR | Status: AC
  Administered 2017-08-27: 125 mg via INTRAVENOUS
  Filled 2017-08-27: qty 2

## 2017-08-27 MED ORDER — SODIUM CHLORIDE 0.9 % IV SOLN
INTRAVENOUS | Status: DC
  Administered 2017-08-27 – 2017-08-28 (×2): via INTRAVENOUS

## 2017-08-27 MED ORDER — INSULIN ASPART 100 UNIT/ML ~~LOC~~ SOLN
0.0000 [IU] | Freq: Three times a day (TID) | SUBCUTANEOUS | Status: DC
  Administered 2017-08-28: 3 [IU] via SUBCUTANEOUS
  Administered 2017-08-28: 2 [IU] via SUBCUTANEOUS
  Administered 2017-08-28: 5 [IU] via SUBCUTANEOUS
  Administered 2017-08-29: 2 [IU] via SUBCUTANEOUS
  Administered 2017-08-29: 3 [IU] via SUBCUTANEOUS
  Administered 2017-08-29: 5 [IU] via SUBCUTANEOUS
  Filled 2017-08-27 (×6): qty 1

## 2017-08-27 MED ORDER — ACETAMINOPHEN 650 MG RE SUPP: 650.0000 mg | Freq: Four times a day (QID) | RECTAL | Status: DC | PRN

## 2017-08-27 MED ORDER — IPRATROPIUM-ALBUTEROL 0.5-2.5 (3) MG/3ML IN SOLN
3.0000 mL | Freq: Once | RESPIRATORY_TRACT | Status: AC
  Administered 2017-08-27: 3 mL via RESPIRATORY_TRACT
  Filled 2017-08-27: qty 3

## 2017-08-27 MED ORDER — SODIUM CHLORIDE 0.9% FLUSH
3.0000 mL | Freq: Two times a day (BID) | INTRAVENOUS | Status: DC
  Administered 2017-08-27 – 2017-08-29 (×5): 3 mL via INTRAVENOUS

## 2017-08-27 NOTE — ED Notes (Signed)
Pt very lethargic and only able to answer yes or no questions with shaking head. When attempting to speak pt oxygen saturation decreases

## 2017-08-27 NOTE — ED Notes (Signed)
Pt received 50mg  iv benadryl administered by EMS in route to facility

## 2017-08-27 NOTE — ED Notes (Signed)
Report given to Sherlyn Lick by [place] [employer] RN

## 2017-08-27 NOTE — ED Notes (Addendum)
Pt O2 sat dropping to 89 on 6L 02 Katherine [name]. Pt repositioned to sit straight up in bed. o2 sat bouncing between 89% and 91%. Pt tongue and right side of face appears swollen. Mouth currently open with tongue sticking out of mouth.

## 2017-08-27 NOTE — ED Notes (Signed)
Pt o2 flow rate increased to 6L [place] due to oxygen sat of 86%

## 2017-08-27 NOTE — ED Triage Notes (Signed)
Pt arrives via ems from home Ems reports pt husband called out due to facial sweling and possible allergic reaction when pt woke up this am. Ems report pt allergic to bees but no reports of stings. On arrival pt on 3L [place] with o2 sat at 89%. Tongue appears swollen, and having difficulty talking. MD present in room.

## 2017-08-27 NOTE — ED Notes (Signed)
Husband contact info  [name]  [phone]

## 2017-08-27 NOTE — ED Notes (Signed)
Pt placed on bipap  

## 2017-08-27 NOTE — Progress Notes (Signed)
Advance care planning  Purpose of Encounter Discussed acute hospitalization being critically ill and CODE STATUS  Parties in Attendance Patient is drowsy and unable to participate in medical decision making.  Husband at bedside who is the healthcare power of attorney was present.  Discussed regarding patient's admission to ICU and being critically ill needing BiPAP.  Husband understands.  Check regarding CODE STATUS of intubation and CPR.  Has been wants intubation and CPR if needed.  Patient was DO NOT RESUSCITATE in the past but during recent admission changed CODE STATUS to full code.  Full code  Time spent - 18 minutes

## 2017-08-27 NOTE — Consult Note (Signed)
[facility] Consultation      Name: Katherine [name] MRN: [id number] DOB: [birth year]    ADMISSION DATE:  08/27/2017 CONSULTATION DATE:  08/27/2017  REFERRING MD :  Darvin Neighbours   CHIEF COMPLAINT:   Allergic reaction   HISTORY OF PRESENT ILLNESS   77 year old female with history of COPD, angioedema, CKD stage III, hypertension, and anxiety who presented to the ED via EMS for evaluation of facial and tongue swelling. Patient also complaining of dysarthria. Dysarthria has been present since a CVA in 4/19 per the chart. Patient is able to nod her head to "yes" or "no" questions upon examination. Patient confirms that she came to the ED today for facial swelling. She confirms that she has had swelling like this before. She also complains of bilateral wrist pain that is greater on the left side than the right. History is limited secondary to patient's BiPAP status. Majority of the history is obtained from patient's chart. She was placed on BiPAP in the ED and was given IV methylprednisolone along with Ativan for anxiety. Per ED note, this has seemed to improve her condition and resolve her tachypnea. She is not on ACEI inhibitors and is allergic to bees. She denies being stung by a bee.  According to patient's son, patient was discharged from [facility] yesterday. Will request records. Patient's son states he thinks she may have been discharged on some new medications.     SIGNIFICANT EVENTS   Patient with significant tachypnea with hypoxia on 6L of O2. Placed on BiPAP in emergency department.    PAST MEDICAL HISTORY    :  Past Medical History:  Diagnosis Date  . Angioedema   . Anxiety   . Anxiety and depression   . Chronic constipation   . Chronic kidney disease    stage 3  . COPD (chronic obstructive pulmonary disease) ([place])   . Gallstones   . GERD (gastroesophageal reflux disease)   . Hypertension   . Left ventricular hypertrophy   . Osteoarthritis   .  Prurigo nodularis   . Tobacco abuse   . Vitamin D deficiency disease    Past Surgical History:  Procedure Laterality Date  . CHOLECYSTECTOMY    . 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   Procedure: TOTAL KNEE ARTHROPLASTY;  Surgeon: Hessie Knows, MD;  Location: [facility] ORS;  Service: Orthopedics;  Laterality: Right;   Prior to Admission medications   Medication Sig Start Date End Date Taking? Authorizing Provider  acetaminophen (TYLENOL) 650 MG CR tablet Take 650-1,300 mg by mouth every 8 (eight) hours as needed for pain.    Yes [provider]  albuterol (ACCUNEB) 0.63 MG/3ML nebulizer solution Take 3 mLs (0.63 mg total) by nebulization 3 (three) times daily as needed for wheezing. DX:J44.9 07/23/17  Yes [name], MD  albuterol (PROVENTIL HFA) 108 (90 Base) MCG/ACT inhaler Inhale 2 puffs into the lungs every 4 (four) hours as needed for wheezing or shortness of breath. 05/30/17  Yes Wieting, Richard, MD  atorvastatin (LIPITOR) 80 MG tablet Take 80 mg by mouth daily. 08/02/17  Yes [provider]  cetirizine (ZYRTEC) 10 MG tablet TAKE 1/2 TABLET(5 MG) BY MOUTH AT BEDTIME 07/25/17  Yes Johnson, Megan P, DO  cholecalciferol (VITAMIN D) 1000 units tablet Take 1,000 Units by mouth daily.   Yes [provider]  clopidogrel (PLAVIX) 75 MG tablet Take 1 tablet (75 mg total) by mouth daily. 06/30/17  Yes Epifanio Lesches, MD  diltiazem (CARDIZEM CD) 120 MG 24 hr capsule Take 120 mg by mouth daily.   Yes [provider]  FLUoxetine (PROZAC) 10 MG capsule TAKE 1 CAPSULE(10 MG) BY MOUTH DAILY 07/25/17  Yes Johnson, Megan P, DO  Fluticasone-Umeclidin-Vilant (TRELEGY ELLIPTA) 100-62.5-25 MCG/INH AEPB Inhale 1 puff into the lungs daily. 08/09/17  Yes [name], MD  gabapentin (NEURONTIN) 300 MG capsule Take 300 mg by mouth at bedtime.  08/02/17  Yes [provider]  hydrALAZINE (APRESOLINE) 25 MG tablet Take 25 mg  by mouth 3 (three) times daily.    Yes [provider]  ipratropium (ATROVENT) 0.03 % nasal spray Place 2 sprays into both nostrils every 12 (twelve) hours.   Yes [provider]  ipratropium-albuterol (DUONEB) 0.5-2.5 (3) MG/3ML SOLN USE 1 VIAL VIA NEBULIZER EVERY 6 HOURS AS NEEDED 07/23/17  Yes [name], MD  methocarbamol (ROBAXIN) 500 MG tablet Take 1 tablet (500MG ) by mouth every 8 hours as needed for muscle spasms 08/08/17  Yes [provider]  omeprazole (PRILOSEC) 40 MG capsule Take 40 mg by mouth daily.    Yes [provider]  predniSONE (DELTASONE) 10 MG tablet 10 mg to be taken 6,6,6,4,4,4,2,2,2,1,1,1 08/24/17  Yes [name], NP  promethazine (PHENERGAN) 12.5 MG tablet Take 12.5 mg by mouth every 6 (six) hours as needed for nausea or vomiting.    Yes [provider]  senna (SENOKOT) 8.6 MG tablet Take 2 tablets by mouth at bedtime as needed for constipation.    Yes [provider]  tiotropium (SPIRIVA) 18 MCG inhalation capsule Place 18 mcg into inhaler and inhale daily.   Yes [provider]   Allergies  Allergen Reactions  . Bee Venom Swelling  . Enalapril Maleate Swelling  . Other     Bee stings     FAMILY HISTORY   Family History  Problem Relation Age of Onset  . Alcohol abuse Mother   . Sickle cell anemia Daughter   . Hypertension Son   . Cancer Neg Hx   . COPD Neg Hx   . Diabetes Neg Hx   . Heart disease Neg Hx   . Stroke Neg Hx       SOCIAL HISTORY    reports that she has quit smoking. Her smoking use included cigarettes. She has a 30.00 pack-year smoking history. She has never used smokeless tobacco. She reports that she does not drink alcohol or use drugs.  Review of Systems  Constitutional: Negative for chills and fever.  HENT: Negative for congestion, ear pain and sore throat.   Eyes: Positive for discharge. Negative for redness.  Respiratory: Positive for shortness of breath and  wheezing. Negative for cough.   Cardiovascular: Negative for chest pain and leg swelling.  Gastrointestinal: Negative for abdominal pain, constipation, diarrhea, nausea and vomiting.  Genitourinary: Negative for dysuria, frequency and urgency.  Musculoskeletal: Positive for joint pain (bilateral wrist pain).  Neurological: Negative for headaches.       Dysarthria      VITAL SIGNS    Temp:  [98.2 F (36.8 C)-98.4 F (36.9 C)] 98.2 F (36.8 C) (07/15 1654) Pulse Rate:  [69-93] 77 (07/15 1654) Resp:  [20-58] 23 (07/15 1654) BP: (101-122)/(61-83) 109/61 (07/15 1400) SpO2:  [86 %-98 %] 96 % (07/15 1654) FiO2 (%):  [35 %] 35 % (07/15 1654) Weight:  [119.3 kg (263 lb)-120 kg (264 lb 8.8 oz)] 120 kg (264 lb 8.8 oz) (07/15 1654)  VENTILATOR SETTINGS: FiO2 (%):  [35 %] 35 % INTAKE / OUTPUT: No intake or output data in the 24 hours ending 08/27/17 1658    PHYSICAL EXAM   Physical Exam  Constitutional: She appears distressed.  Appears anxious  HENT:  Head: Normocephalic and atraumatic.  Eyes: Pupils are equal, round, and reactive to light. Conjunctivae and EOM are normal. Right eye exhibits discharge. Left eye exhibits discharge.  Cardiovascular: Normal rate, regular rhythm and normal heart sounds. Exam reveals no gallop and no friction rub.  No murmur heard. 2+ dorsalis pedis pulses  Pulmonary/Chest: She is in respiratory distress. She has wheezes.  On BiPAP  Abdominal: Soft. Bowel sounds are normal.  Musculoskeletal: She exhibits tenderness (significant bilateral wrist tenderness, L>R, L wrist swelling).  Lymphadenopathy:    She has no cervical adenopathy.  Neurological: She is alert.  Unable to fully assess secondary to BiPAP and facial swelling  Skin: Skin is warm and dry. Capillary refill takes less than 2 seconds.       LABS   LABS:  CBC Recent Labs  Lab 08/27/17 1219  WBC 7.9  HGB 10.9*  HCT 34.4*  PLT 325   Coag's No results for input(s): APTT,  INR in the last 168 hours. BMET Recent Labs  Lab 08/27/17 1219  NA 142  K 5.5*  CL 104  CO2 30  BUN 33*  CREATININE 2.17*  GLUCOSE 105*   Electrolytes Recent Labs  Lab 08/27/17 1219  CALCIUM 9.1   Sepsis Markers No results for input(s): LATICACIDVEN, PROCALCITON, O2SATVEN in the last 168 hours. ABG No results for input(s): PHART, PCO2ART, PO2ART in the last 168 hours. Liver Enzymes Recent Labs  Lab 08/27/17 1219  AST 18  ALT 16  ALKPHOS 66  BILITOT 0.4  ALBUMIN 3.2*   Cardiac Enzymes Recent Labs  Lab 08/27/17 1219  TROPONINI <0.03   Glucose No results for input(s): GLUCAP in the last 168 hours.   No results found for this or any previous visit (from the past 240 hour(s)).   Current Facility-Administered Medications:  .  0.9 %  sodium chloride infusion, , Intravenous, Continuous, Sudini, Srikar, MD .  acetaminophen (TYLENOL) tablet 650 mg, 650 mg, Oral, Q6H PRN **OR** acetaminophen (TYLENOL) suppository 650 mg, 650 mg, Rectal, Q6H PRN, Sudini, Srikar, MD .  albuterol (PROVENTIL) (2.5 MG/3ML) 0.083% nebulizer solution 2.5 mg, 2.5 mg, Nebulization, Q2H PRN, Sudini, Srikar, MD .  diphenhydrAMINE (BENADRYL) injection 25 mg, 25 mg, Intravenous, Q6H PRN, Sudini, Srikar, MD .  diphenhydrAMINE (BENADRYL) injection 25 mg, 25 mg, Intravenous, Q8H, Sudini, Srikar, MD .  heparin injection 5,000 Units, 5,000 Units, Subcutaneous, Q8H, Sudini, Srikar, MD .  ipratropium-albuterol (DUONEB) 0.5-2.5 (3) MG/3ML nebulizer solution 3 mL, 3 mL, Nebulization, Q6H, Sudini, Srikar, MD .  methylPREDNISolone sodium succinate (SOLU-MEDROL) 125 mg/2 mL injection 60 mg, 60 mg, Intravenous, Q6H, Sudini, Srikar, MD .  ondansetron (ZOFRAN) tablet 4 mg, 4 mg, Oral, Q6H PRN **OR** ondansetron (ZOFRAN) injection 4 mg, 4 mg, Intravenous, Q6H PRN, Sudini, Srikar, MD .  polyethylene glycol (MIRALAX / GLYCOLAX) packet 17 g, 17 g, Oral, Daily PRN, Sudini, Srikar, MD .  sodium chloride flush (NS) 0.9 %  injection 3 mL, 3 mL, Intravenous, Q12H, Hillary Bow, MD  IMAGING    Dg Chest 1 View  Result Date: 08/27/2017 CLINICAL DATA:  77 year old female with shortness of breath. EXAM: CHEST  1 VIEW COMPARISON:  Chest radiograph 07/24/2017 and earlier. FINDINGS: Portable AP semi upright view at 1350 hours. Stable  mild cardiomegaly. Stable tortuous and calcified thoracic aorta. Stable lung volumes since June, lower than in May. Stable pulmonary vascularity. No pneumothorax. No pleural effusion or consolidation. Increased streaky bibasilar opacity that most resembles atelectasis. Paucity of bowel gas in the upper abdomen. Visualized tracheal air column is within normal limits. IMPRESSION: 1. Continued low lung volumes. Increased streaky bibasilar opacity most resembles atelectasis. 2. Stable cardiomegaly and calcified aortic atherosclerosis. Electronically Signed   By: Genevie Ann M.D.   On: 08/27/2017 14:23    MICRO DATA: MRSA PCR: pending  Urine: pending    ASSESSMENT/PLAN   77 year old female who presented to the ED for evaluation of facial and tongue swelling. Patient has history of COPD, angioedema, CKD stage III, hypertension, CVA w/resulting dysarthria, carpal tunnel, and anxiety. Patient placed on BiPAP in the ED for O2 sat 89% on 6L nasal cannula. On exam, patient with swollen tongue and face, but states that she feels as though she is improving.   PULMONARY A: hypoxic and hypercarbic respiratory failure secondary to obesity and COPD complicated by allergic reaction P: IV methylprednisolone, NEB treatment, Benadryl and pepcid.  CXR with bibasilar atelectasis. Low lung volumes consistent with previous CXR -repeat CXR in morning  CARDIOVASCULAR A: Hypertension P: continue hydralazine and diltiazem -Troponin <0.03  RENAL A: CKD stage III with AKI, hyperkalemia P:  IVF fluids for AKI. Cr 2.17 today. No treatment for hyperkalemia currently, likely due to acidosis that is resolving. Patient  noted to having chronic bicarb elevation. Redraw BMP in AM.  -Trend Creatinine -CK pending  GASTROINTESTINAL A:  Stress ulcer prophylaxis, diet as able to tolerate P:  Pepcid for allergic reaction and SUP   HEMATOLOGIC A:  DVT prophylaxis P: Heparin and SCD. Continue Clopidogrel.   INFECTIOUS -no concerns, will trend CBC and repeat CXR in AM  ENDOCRINE -Monitor sugars with steroid use.   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 P:  After chart review, this appears to be a chronic condition. Will monitor as allergic reaction improves.  -Continue Fluoxetine for anxiety and depression   I have personally obtained a history, examined the patient, evaluated laboratory and independently reviewed  imaging results, formulated the assessment and plan and placed orders.  The Patient requires high complexity decision making for assessment and support, frequent evaluation and titration of therapies, application of advanced monitoring technologies and extensive interpretation of multiple databases. Critical Care Time devoted to patient care services described in this note is 50 minutes.   Overall, patient is critically ill, prognosis is guarded.

## 2017-08-27 NOTE — H&P (Signed)
[facility] NAME: Katherine [name]    MR#:  [id number]  DATE OF BIRTH:  [birth year]  DATE OF ADMISSION:  08/27/2017  PRIMARY CARE PHYSICIAN: [name], NP   REQUESTING/REFERRING PHYSICIAN: Dr. Jimmye Norman  CHIEF COMPLAINT:   Chief Complaint  Patient presents with  . Allergic Reaction    HISTORY OF PRESENT ILLNESS:  Katherine [name]  is a 77 y.o. female with a known history of COPD, hypertension presented to the emergency room brought in by the husband due to facial swelling and tongue swelling.  Patient was also noticed to be wheezing.  Extremely tachypneic and started on BiPAP.  Was given Solu-Medrol.  Patient slowly improved and was given a dose of IV Ativan due to anxiety.  Presently patient is drowsy and unable to contribute to history.  BiPAP has been taken off but continues to have significant tongue swelling and tachypnea.  History obtained from husband and reviewing old records. Chest x-ray showed no acute abnormalities.  Not on ACE inhibitors.  No recent change in medications.  PAST MEDICAL HISTORY:   Past Medical History:  Diagnosis Date  . Angioedema   . Anxiety   . Anxiety and depression   . Chronic constipation   . Chronic kidney disease    stage 3  . COPD (chronic obstructive pulmonary disease) ([place])   . Gallstones   . GERD (gastroesophageal reflux disease)   . Hypertension   . Left ventricular hypertrophy   . Osteoarthritis   . Prurigo nodularis   . Tobacco abuse   . Vitamin D deficiency disease     PAST SURGICAL HISTORY:   Past Surgical History:  Procedure Laterality Date  . CHOLECYSTECTOMY    . 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   Procedure: TOTAL KNEE ARTHROPLASTY;  Surgeon: Hessie Knows, MD;  Location: [facility] ORS;  Service: Orthopedics;  Laterality: Right;    SOCIAL HISTORY:   Social History   Tobacco Use  . Smoking status: Former Smoker    Packs/day:  0.50    Years: 60.00    Pack years: 30.00    Types: Cigarettes  . Smokeless tobacco: Never Used  Substance Use Topics  . Alcohol use: No    Alcohol/week: 0.0 oz    Comment: rare    FAMILY HISTORY:   Family History  Problem Relation Age of Onset  . Alcohol abuse Mother   . Sickle cell anemia Daughter   . Hypertension Son   . Cancer Neg Hx   . COPD Neg Hx   . Diabetes Neg Hx   . Heart disease Neg Hx   . Stroke Neg Hx     DRUG ALLERGIES:   Allergies  Allergen Reactions  . Bee Venom Swelling  . Enalapril Maleate Swelling  . Other     Bee stings    REVIEW OF SYSTEMS:   Review of Systems  Unable to perform ROS: Mental status change    MEDICATIONS AT HOME:   Prior to Admission medications   Medication Sig Start Date End Date Taking? Authorizing Provider  acetaminophen (TYLENOL) 650 MG CR tablet Take 650-1,300 mg by mouth every 8 (eight) hours as needed for pain.    Yes [provider]  albuterol (ACCUNEB) 0.63 MG/3ML nebulizer solution Take 3 mLs (0.63 mg total) by nebulization 3 (three) times daily as needed for wheezing. DX:J44.9 07/23/17  Yes [name], MD  albuterol (PROVENTIL HFA)  108 (90 Base) MCG/ACT inhaler Inhale 2 puffs into the lungs every 4 (four) hours as needed for wheezing or shortness of breath. 05/30/17  Yes Wieting, Richard, MD  atorvastatin (LIPITOR) 80 MG tablet Take 80 mg by mouth daily. 08/02/17  Yes [provider]  cetirizine (ZYRTEC) 10 MG tablet TAKE 1/2 TABLET(5 MG) BY MOUTH AT BEDTIME 07/25/17  Yes Johnson, Megan P, DO  cholecalciferol (VITAMIN D) 1000 units tablet Take 1,000 Units by mouth daily.   Yes [provider]  clopidogrel (PLAVIX) 75 MG tablet Take 1 tablet (75 mg total) by mouth daily. 06/30/17  Yes Epifanio Lesches, MD  diltiazem (CARDIZEM CD) 120 MG 24 hr capsule Take 120 mg by mouth daily.   Yes [provider]  FLUoxetine (PROZAC) 10 MG capsule TAKE 1 CAPSULE(10 MG) BY MOUTH DAILY  07/25/17  Yes Johnson, Megan P, DO  Fluticasone-Umeclidin-Vilant (TRELEGY ELLIPTA) 100-62.5-25 MCG/INH AEPB Inhale 1 puff into the lungs daily. 08/09/17  Yes [name], MD  gabapentin (NEURONTIN) 300 MG capsule Take 300 mg by mouth at bedtime.  08/02/17  Yes [provider]  hydrALAZINE (APRESOLINE) 25 MG tablet Take 25 mg by mouth 3 (three) times daily.    Yes [provider]  ipratropium (ATROVENT) 0.03 % nasal spray Place 2 sprays into both nostrils every 12 (twelve) hours.   Yes [provider]  ipratropium-albuterol (DUONEB) 0.5-2.5 (3) MG/3ML SOLN USE 1 VIAL VIA NEBULIZER EVERY 6 HOURS AS NEEDED 07/23/17  Yes [name], MD  methocarbamol (ROBAXIN) 500 MG tablet Take 1 tablet (500MG ) by mouth every 8 hours as needed for muscle spasms 08/08/17  Yes [provider]  omeprazole (PRILOSEC) 40 MG capsule Take 40 mg by mouth daily.    Yes [provider]  predniSONE (DELTASONE) 10 MG tablet 10 mg to be taken 6,6,6,4,4,4,2,2,2,1,1,1 08/24/17  Yes [name], NP  promethazine (PHENERGAN) 12.5 MG tablet Take 12.5 mg by mouth every 6 (six) hours as needed for nausea or vomiting.    Yes [provider]  senna (SENOKOT) 8.6 MG tablet Take 2 tablets by mouth at bedtime as needed for constipation.    Yes [provider]  tiotropium (SPIRIVA) 18 MCG inhalation capsule Place 18 mcg into inhaler and inhale daily.   Yes [provider]     VITAL SIGNS:  Blood pressure 109/61, pulse 69, temperature 98.4 F (36.9 C), temperature source Oral, resp. rate (!) 21, height 5' (1.524 m), weight 119.3 kg (263 lb), SpO2 93 %.  PHYSICAL EXAMINATION:  Physical Exam  GENERAL:  77 y.o.-year-old patient lying in the bed, tachypneic and looks critically ill EYES: Pupils equal, round, reactive to light and accommodation. No scleral icterus. Extraocular muscles intact.  HEENT: Swollen tongue sticking out of the mouth.  Mild facial swelling  and lip swelling noticed. NECK:  Supple, no jugular venous distention. No thyroid enlargement, no tenderness.  LUNGS: Decreased breath sounds bilaterally with wheezing CARDIOVASCULAR: S1, S2 normal. No murmurs, rubs, or gallops.  ABDOMEN: Soft, nontender, nondistended. Bowel sounds present. No organomegaly or mass.  EXTREMITIES: No pedal edema, cyanosis, or clubbing. + 2 pedal & radial pulses b/l.   NEUROLOGIC: Cranial nerves II through XII are intact. No focal Motor or sensory deficits appreciated b/l PSYCHIATRIC: The patient is drowsy SKIN: No obvious rash, lesion, or ulcer.   LABORATORY PANEL:   CBC Recent Labs  Lab 08/27/17 1219  WBC 7.9  HGB 10.9*  HCT 34.4*  PLT 325   ------------------------------------------------------------------------------------------------------------------  Chemistries  Recent Labs  Lab 08/27/17 1219  NA 142  K 5.5*  CL 104  CO2 30  GLUCOSE 105*  BUN 33*  CREATININE 2.17*  CALCIUM 9.1  AST 18  ALT 16  ALKPHOS 66  BILITOT 0.4   ------------------------------------------------------------------------------------------------------------------  Cardiac Enzymes Recent Labs  Lab 08/27/17 1219  TROPONINI <0.03   ------------------------------------------------------------------------------------------------------------------  RADIOLOGY:  Dg Chest 1 View  Result Date: 08/27/2017 CLINICAL DATA:  77 year old female with shortness of breath. EXAM: CHEST  1 VIEW COMPARISON:  Chest radiograph 07/24/2017 and earlier. FINDINGS: Portable AP semi upright view at 1350 hours. Stable mild cardiomegaly. Stable tortuous and calcified thoracic aorta. Stable lung volumes since June, lower than in May. Stable pulmonary vascularity. No pneumothorax. No pleural effusion or consolidation. Increased streaky bibasilar opacity that most resembles atelectasis. Paucity of bowel gas in the upper abdomen. Visualized tracheal air column is within normal limits.  IMPRESSION: 1. Continued low lung volumes. Increased streaky bibasilar opacity most resembles atelectasis. 2. Stable cardiomegaly and calcified aortic atherosclerosis. Electronically Signed   By: Genevie Ann M.D.   On: 08/27/2017 14:23     IMPRESSION AND PLAN:   *Angioedema versus an allergic reaction.  Patient has been given 1 dose of IV Solu-Medrol.  We will continue this every 6 hours at 60 mg.  IV Benadryl.  Her COPD exacerbation is likely due to allergic reaction and patient is on steroids.  Scheduled nebulizers ordered.  Continues to be drowsy and will be placed on BiPAP.  Discussed with intensivist Dr. Manuella Ghazi.  *Acute on chronic respiratory failure with COPD exacerbation.  *Acute encephalopathy likely due to hypercarbia and medication Ativan Monitor on BiPAP  *Acute kidney injury over CKD stage III.  Will check a CK level.  Start IV fluids.  Monitor input and output.  Repeat labs in the morning  *DVT prophylaxis with heparin  All the records are reviewed and case discussed with ED provider. Management plans discussed with the patient, family and they are in agreement.  CODE STATUS: Full code  TOTAL CC TIME TAKING CARE OF THIS PATIENT: 60 minutes.   Neita Carp M.D on 08/27/2017 at 4:04 PM  Between 7am to 6pm - Pager - [phone]  After 6pm go to [email or web address] - password [email or web address] [facility]  Office  [phone]  CC: Primary care physician; [name], NP  Note: This dictation was prepared with Dragon dictation along with smaller phrase technology. Any transcriptional errors that result from this process are unintentional.

## 2017-08-27 NOTE — ED Notes (Signed)
RT contacted to place pt on bipap. Respirations currently shallow and 58/min

## 2017-08-27 NOTE — ED Provider Notes (Signed)
[facility] Emergency Department Provider Note       Time seen: ----------------------------------------- 12:39 PM on 08/27/2017 -----------------------------------------   I have reviewed the triage vital signs and the nursing notes.  HISTORY   Chief Complaint Allergic Reaction    HPI Katherine [name] is a 77 y.o. female with a history of angioedema, anxiety, chronic kidney disease, COPD, GERD, hypertension, LVH who presents to the ED for possible allergic reaction.  EMS reports husband called out due to facial swelling and possible allergic reaction the patient woke up this morning.  She is allergic to bees but has not had any recent bee stings.  Currently she is not on an ACE inhibitor, she arrives on 3 L of nasal cannula with oxygen saturation around 89%.  She was having difficulty talking with some facial and possibly tongue swelling.  Past Medical History:  Diagnosis Date  . Angioedema   . Anxiety   . Anxiety and depression   . Chronic constipation   . Chronic kidney disease    stage 3  . COPD (chronic obstructive pulmonary disease) ([place])   . Gallstones   . GERD (gastroesophageal reflux disease)   . Hypertension   . Left ventricular hypertrophy   . Osteoarthritis   . Prurigo nodularis   . Tobacco abuse   . Vitamin D deficiency disease     Patient Active Problem List   Diagnosis Date Noted  . Frequent hospital admissions 08/22/2017  . Aphasia 06/28/2017  . Acute on chronic respiratory failure with hypoxia ([place]) 06/12/2017  . Hand pain 03/21/2017  . Dry skin 03/21/2017  . Pain in finger of left hand 09/12/2016  . Chronic fatigue 06/12/2016  . Left knee pain 06/12/2016  . Advanced care planning/counseling discussion 03/13/2016  . DNR (do not resuscitate) 03/13/2016  . Primary localized osteoarthritis of right knee 02/08/2016  . OSA and COPD overlap syndrome ([place]) 09/16/2015  . Rotator cuff syndrome 09/07/2015  . Pulmonary scarring  07/27/2015  . Sleep disturbance 04/14/2015  . Coronary artery disease 03/14/2015  . 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 Anxiety   . Tobacco abuse   . Prurigo nodularis   . GERD (gastroesophageal reflux disease)   . COPD (chronic obstructive pulmonary disease) ([place])   . Osteoarthritis   . Vitamin D deficiency disease   . Chronic constipation   . CKD (chronic kidney disease), stage III ([place])   . LVH (left ventricular hypertrophy) 05/20/2013  . Essential hypertension 05/20/2013  . Morbid obesity ([place]) 05/20/2013    Past Surgical History:  Procedure Laterality Date  . CHOLECYSTECTOMY    . 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   Procedure: TOTAL KNEE ARTHROPLASTY;  Surgeon: Hessie Knows, MD;  Location: [facility] ORS;  Service: Orthopedics;  Laterality: Right;    Allergies Bee venom; Enalapril maleate; and Other  Social History Social History   Tobacco Use  . Smoking status: Former Smoker    Packs/day: 0.50    Years: 60.00    Pack years: 30.00    Types: Cigarettes  . Smokeless tobacco: Never Used  Substance Use Topics  . Alcohol use: No    Alcohol/week: 0.0 oz    Comment: rare  . Drug use: No   Review of Systems Constitutional: Negative for fever. ENT: Positive for tongue and facial swelling Cardiovascular: Negative for chest pain. Respiratory: Negative for shortness of breath. Gastrointestinal: Negative for abdominal pain, vomiting and  diarrhea. Musculoskeletal: Negative for back pain. Skin: Negative for rash or hives Neurological: Negative for headaches, focal weakness or numbness.  All systems negative/normal/unremarkable except as stated in the HPI  ____________________________________________   PHYSICAL EXAM:  VITAL SIGNS: ED Triage Vitals  Enc Vitals Group     BP 08/27/17 1222 (!) 132/117     Pulse Rate 08/27/17 1222 92     Resp 08/27/17 1222 20     Temp 08/27/17 1222 98.4 F  (36.9 C)     Temp Source 08/27/17 1222 Oral     SpO2 08/27/17 1222 (!) 89 %     Weight 08/27/17 1224 263 lb (119.3 kg)     Height 08/27/17 1224 5' (1.524 m)     Head Circumference --      Peak Flow --      Pain Score 08/27/17 1223 0     Pain Loc --      Pain Edu? --      Excl. in [place]? --    Constitutional: Alert and oriented.  Mild distress Eyes: Conjunctivae are normal. Normal extraocular movements. ENT   Head: Normocephalic and atraumatic.   Nose: No congestion/rhinnorhea.   Mouth/Throat: Mucous membranes are moist.  Some tongue swelling is noted   Neck: No stridor. Cardiovascular: Normal rate, regular rhythm. No murmurs, rubs, or gallops. Respiratory: Normal respiratory effort without tachypnea nor retractions. Breath sounds are clear and equal bilaterally. No wheezes/rales/rhonchi. Gastrointestinal: Soft and nontender. Normal bowel sounds Musculoskeletal: Nontender with normal range of motion in extremities. No lower extremity tenderness nor edema. Neurologic: Difficulty speaking due to enlarged tongue, no gross focal neurologic deficits are appreciated.  Skin:  Skin is warm, dry and intact. No rash noted. Psychiatric: Mood and affect are normal.  ____________________________________________  EKG: Interpreted by me.  Sinus rhythm rate 81 bpm, normal PR interval, normal QRS, normal QT, left axis deviation  ____________________________________________  ED COURSE:  As part of my medical decision making, I reviewed the following data within the [place] History obtained from family if available, nursing notes, old chart and ekg, as well as notes from prior ED visits. Patient presented for possible allergic reaction or likely angioedema, we will assess with labs and reevaluate.  She will receive IV Solu-Medrol as well. Clinical Course as of Aug 27 1357  Mon Aug 27, 2017  1332 Patient is much improved after Ativan   [JW]    Clinical Course User  Index [JW] Earleen Newport, MD   Procedures ____________________________________________   CRITICAL CARE Performed by: Laurence Aly   Total critical care time: 30 minutes  Critical care time was exclusive of separately billable procedures and treating other patients.  Critical care was necessary to treat or prevent imminent or life-threatening deterioration.  Critical care was time spent personally by me on the following activities: development of treatment plan with patient and/or surrogate as well as nursing, discussions with consultants, evaluation of patient's response to treatment, examination of patient, obtaining history from patient or surrogate, ordering and performing treatments and interventions, ordering and review of laboratory studies, ordering and review of radiographic studies, pulse oximetry and re-evaluation of patient's condition.   LABS (pertinent positives/negatives)  Labs Reviewed  CBC WITH DIFFERENTIAL/PLATELET - Abnormal; Notable for the following components:      Result Value   Hemoglobin 10.9 (*)    HCT 34.4 (*)    MCHC 31.8 (*)    RDW 16.4 (*)    All other components within  normal limits  COMPREHENSIVE METABOLIC PANEL - Abnormal; Notable for the following components:   Potassium 5.5 (*)    Glucose, Bld 105 (*)    BUN 33 (*)    Creatinine, Ser 2.17 (*)    Albumin 3.2 (*)    GFR calc non Af Amer 21 (*)    GFR calc Af Amer 24 (*)    All other components within normal limits  BLOOD GAS, VENOUS - Abnormal; Notable for the following components:   pCO2, Ven 68 (*)    pO2, Ven <31.0 (*)    Bicarbonate 33.5 (*)    Acid-Base Excess 5.3 (*)    All other components within normal limits  TROPONIN I  URINALYSIS, COMPLETE (UACMP) WITH MICROSCOPIC  CBG MONITORING, ED  ____________________________________________  DIFFERENTIAL DIAGNOSIS   Angioedema, allergic reaction, medication side effect  FINAL ASSESSMENT AND  PLAN  Angioedema   Plan: The patient had presented for facial swelling and difficulty talking. Patient's labs did reveal mild hypercarbia as well as mild renal insufficiency.  These findings are acute on chronic. Patient's imaging was negative for any acute process.  We gave her Solu-Medrol as well as start her on some BiPAP.  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  She also improved dramatically with Ativan as there may be some anxiety component to this.  I will discuss with the hospitalist for admission.   Laurence Aly, MD   Note: This note was generated in part or whole with voice recognition software. Voice recognition is usually quite accurate but there are transcription errors that can and very often do occur. I apologize for any typographical errors that were not detected and corrected.     Earleen Newport, MD 08/27/17 [phone]

## 2017-08-28 LAB — BASIC METABOLIC PANEL
Anion gap: 11 (ref 5–15)
BUN: 36 mg/dL — AB (ref 8–23)
CO2: 24 mmol/L (ref 22–32)
CREATININE: 1.82 mg/dL — AB (ref 0.44–1.00)
Calcium: 8.9 mg/dL (ref 8.9–10.3)
Chloride: 106 mmol/L (ref 98–111)
GFR calc Af Amer: 30 mL/min — ABNORMAL LOW (ref >60)
GFR, EST NON AFRICAN AMERICAN: 26 mL/min — AB (ref >60)
GLUCOSE: 139 mg/dL — AB (ref 70–99)
POTASSIUM: 5.2 mmol/L — AB (ref 3.5–5.1)
Sodium: 141 mmol/L (ref 135–145)

## 2017-08-28 LAB — CBC
HEMATOCRIT: 32.7 % — AB (ref 35.0–47.0)
Hemoglobin: 10.3 g/dL — ABNORMAL LOW (ref 12.0–16.0)
MCH: 28 pg (ref 26.0–34.0)
MCHC: 31.4 g/dL — ABNORMAL LOW (ref 32.0–36.0)
MCV: 89.2 fL (ref 80.0–100.0)
PLATELETS: 263 10*3/uL (ref 150–440)
RBC: 3.67 MIL/uL — ABNORMAL LOW (ref 3.80–5.20)
RDW: 16.6 % — AB (ref 11.5–14.5)
WBC: 8.6 10*3/uL (ref 3.6–11.0)

## 2017-08-28 LAB — PROTIME-INR
INR: 0.92
Prothrombin Time: 12.3 seconds (ref 11.4–15.2)

## 2017-08-28 LAB — APTT: aPTT: <24 seconds — ABNORMAL LOW (ref 24–36)

## 2017-08-28 LAB — GLUCOSE, CAPILLARY
GLUCOSE-CAPILLARY: 115 mg/dL — AB (ref 70–99)
GLUCOSE-CAPILLARY: 122 mg/dL — AB (ref 70–99)
GLUCOSE-CAPILLARY: 238 mg/dL — AB (ref 70–99)
Glucose-Capillary: 151 mg/dL — ABNORMAL HIGH (ref 70–99)
Glucose-Capillary: 215 mg/dL — ABNORMAL HIGH (ref 70–99)

## 2017-08-28 NOTE — Progress Notes (Signed)
[place] [facility] [place] NAME: Katherine [name]    MR#:  [id number]  DATE OF BIRTH:  1941-01-10  SUBJECTIVE:  CHIEF COMPLAINT:   Chief Complaint  Patient presents with  . Allergic Reaction  Patient seen and evaluated today Has decreased shortness of breath Comfortable on oxygen via nasal cannula Awake and alert and responds to verbal commands  REVIEW OF SYSTEMS:    ROS  CONSTITUTIONAL: No documented fever. No fatigue, weakness. No weight gain, no weight loss.  EYES: No blurry or double vision.  ENT: No tinnitus. No postnasal drip. No redness of the oropharynx.  RESPIRATORY: Occasional cough, has wheeze, no hemoptysis. No dyspnea.  CARDIOVASCULAR: No chest pain. No orthopnea. No palpitations. No syncope.  GASTROINTESTINAL: No nausea, no vomiting or diarrhea. No abdominal pain. No melena or hematochezia.  GENITOURINARY: No dysuria or hematuria.  ENDOCRINE: No polyuria or nocturia. No heat or cold intolerance.  HEMATOLOGY: No anemia. No bruising. No bleeding.  INTEGUMENTARY: No rashes. No lesions.  MUSCULOSKELETAL: No arthritis. No swelling. No gout.  NEUROLOGIC: No numbness, tingling, or ataxia. No seizure-type activity.  PSYCHIATRIC: No anxiety. No insomnia. No ADD.   DRUG ALLERGIES:   Allergies  Allergen Reactions  . Bee Venom Swelling  . Enalapril Maleate Swelling  . Other     Bee stings    VITALS:  Blood pressure (!) 159/90, pulse 63, temperature 98.2 F (36.8 C), temperature source Axillary, resp. rate (!) 21, height 5' (1.524 m), weight 120.9 kg (266 lb 8 oz), SpO2 92 %.  PHYSICAL EXAMINATION:   Physical Exam  GENERAL:  77 y.o.-year-old patient lying in the bed with no acute distress.  EYES: Pupils equal, round, reactive to light and accommodation. No scleral icterus. Extraocular muscles intact.  HEENT: Head atraumatic, normocephalic. Oropharynx and nasopharynx clear.  NECK:  Supple, no jugular venous distention. No  thyroid enlargement, no tenderness.  LUNGS: Decreased breath sounds bilaterally, decreased wheezing. No use of accessory muscles of respiration.  CARDIOVASCULAR: S1, S2 normal. No murmurs, rubs, or gallops.  ABDOMEN: Soft, nontender, nondistended. Bowel sounds present. No organomegaly or mass.  EXTREMITIES: No cyanosis, clubbing or edema b/l.    NEUROLOGIC: Cranial nerves II through XII are intact. No focal Motor or sensory deficits b/l.   PSYCHIATRIC: The patient is alert and oriented x 3.  SKIN: No obvious rash, lesion, or ulcer.   LABORATORY PANEL:   CBC Recent Labs  Lab 08/28/17 0607  WBC 8.6  HGB 10.3*  HCT 32.7*  PLT 263   ------------------------------------------------------------------------------------------------------------------ Chemistries  Recent Labs  Lab 08/27/17 1219  08/28/17 0440  NA 142   < > 141  K 5.5*   < > 5.2*  CL 104   < > 106  CO2 30   < > 24  GLUCOSE 105*   < > 139*  BUN 33*   < > 36*  CREATININE 2.17*   < > 1.82*  CALCIUM 9.1   < > 8.9  AST 18  --   --   ALT 16  --   --   ALKPHOS 66  --   --   BILITOT 0.4  --   --    < > = values in this interval not displayed.   ------------------------------------------------------------------------------------------------------------------  Cardiac Enzymes Recent Labs  Lab 08/27/17 1219  TROPONINI <0.03   ------------------------------------------------------------------------------------------------------------------  RADIOLOGY:  Dg Chest 1 View  Result Date: 08/27/2017 CLINICAL DATA:  77 year old female with shortness of breath. EXAM: CHEST  1 VIEW COMPARISON:  Chest radiograph 07/24/2017 and earlier. FINDINGS: Portable AP semi upright view at 1350 hours. Stable mild cardiomegaly. Stable tortuous and calcified thoracic aorta. Stable lung volumes since June, lower than in May. Stable pulmonary vascularity. No pneumothorax. No pleural effusion or consolidation. Increased streaky bibasilar opacity  that most resembles atelectasis. Paucity of bowel gas in the upper abdomen. Visualized tracheal air column is within normal limits. IMPRESSION: 1. Continued low lung volumes. Increased streaky bibasilar opacity most resembles atelectasis. 2. Stable cardiomegaly and calcified aortic atherosclerosis. Electronically Signed   By: Genevie Ann M.D.   On: 08/27/2017 14:23     ASSESSMENT AND PLAN:  77 year old female patient with history of COPD, hypertension was initially presented to the emergency room for facial and tongue swelling.  She was also short of breath and wheezing.  -Acute allergic reaction resolved Continue IV Solu-Medrol and PRN Benadryl  -Acute COPD exacerbation improving Oxygen via nasal cannula IV Solu-Medrol and nebulization treatments  -Acute on chronic respiratory failure Continue oxygen via nasal cannula  -Acute kidney injury on CKD stage III Improving  -DVT prophylaxis subcu heparin   All the records are reviewed and case discussed with Care Management/Social Worker. Management plans discussed with the patient, family and they are in agreement.  CODE STATUS: Full code  DVT Prophylaxis: SCDs  TOTAL TIME TAKING CARE OF THIS PATIENT: 36 minutes.   POSSIBLE D/C IN 1 to 2 DAYS, DEPENDING ON CLINICAL CONDITION.  Saundra Shelling M.D on 08/28/2017 at 11:05 AM  Between 7am to 6pm - Pager - [phone]  After 6pm go to [email or web address] - password [email or web address] [facility]  Office  [phone]  CC: Primary care physician; [name], NP  Note: This dictation was prepared with Dragon dictation along with smaller phrase technology. Any transcriptional errors that result from this process are unintentional.

## 2017-08-28 NOTE — Progress Notes (Signed)
Pt admitted to stepdown unit for continuous Bipap due to acute on chronic hypoxic hypercapnic respiratory failure secondary to COPD complicated by possible allergic reaction with angioedema. However, upon arrival to stepdown unit pt did not require continuous Bipap, respiratory status stable, vss, and mentation stable.  Will transfer pt to medsurg unit with cardiac monitoring.   Marda Stalker, [place] Pager [phone] (please enter 7 digits) PCCM Consult Pager [phone] (please enter 7 digits)

## 2017-08-28 NOTE — Evaluation (Signed)
Physical Therapy Evaluation Patient Details Name: Katherine [name] MRN: [id number] DOB: 1940/11/03 Today's Date: 08/28/2017   History of Present Illness   77 y.o. female with a history of angioedema, anxiety, chronic kidney disease, COPD, GERD, hypertension, LVH who presents to the ED for possible allergic reaction.  Clinical Impression  Patient A&Ox4 and reports b/l arm pain that she states is a chronic pain. Patient reports using [facility] independently for ambulation for household distances, with 24/7 supervision from family and an aide once a week, and assistance for self care/ADLs. Patient also on oxygen at home, 2-3L via [place]. The patient was able to mobilize to EOB with supervision, transfer with CGA and IV pole, and ambulate ~40ft. O2 via [place] at 2L in place during session spO2>88%, though patient notably SOB/fatigued after mobility. Patient up in chair with spO2>92% at end of session with nursing staff. The patient would benefit from further skilled PT to address limitations in activity tolerance, endurance, strength, balance and gait.     Follow Up Recommendations Home health PT    Equipment Recommendations  None recommended by PT    Recommendations for Other Services       Precautions / Restrictions Precautions Precautions: Fall Restrictions Weight Bearing Restrictions: No      Mobility  Bed Mobility Overal bed mobility: Needs Assistance Bed Mobility: Supine to Sit     Supine to sit: Supervision        Transfers Overall transfer level: Needs assistance   Transfers: Sit to/from Stand Sit to Stand: Supervision            Ambulation/Gait Ambulation/Gait assistance: Min guard Gait Distance (Feet): 20 Feet Assistive device: IV Pole Gait Pattern/deviations: Wide base of support;Step-through pattern;Decreased stride length        Stairs            Wheelchair Mobility    Modified Rankin (Stroke Patients Only)       Balance Overall balance assessment:  Mild deficits observed, not formally tested                                           Pertinent Vitals/Pain Pain Assessment: 0-10 Pain Score: 9  Pain Location: b/l arms Pain Descriptors / Indicators: Aching;Tightness;Constant Pain Intervention(s): Repositioned;Monitored during session    [place] expects to be discharged to:: Private residence Living Arrangements: Spouse/significant other Available Help at Discharge: Family;Available 24 hours/day Type of Home: Apartment Home Access: Level entry     Home Layout: One level Home Equipment: Walker - 2 wheels;Walker - standard;Cane - single point;Shower seat;Bedside commode      Prior Function Level of Independence: Needs assistance   Gait / Transfers Assistance Needed: Uses cane at home all the time   ADL's / Homemaking Assistance Needed: Receives assistance for bathing and dressing.        Hand Dominance   Dominant Hand: Right    Extremity/Trunk Assessment   Upper Extremity Assessment Upper Extremity Assessment: Defer to OT evaluation;Overall WFL for tasks assessed(edema of RUE noted, elevated at end of session)    Lower Extremity Assessment Lower Extremity Assessment: Overall WFL for tasks assessed;Generalized weakness;RLE deficits/detail;LLE deficits/detail RLE Deficits / Details: 3+/5 LLE Deficits / Details: 3+/5       Communication   Communication: Expressive difficulties  Cognition Arousal/Alertness: Awake/alert Behavior During Therapy: WFL for tasks assessed/performed  General Comments      Exercises     Assessment/Plan    PT Assessment Patient needs continued PT services  PT Problem List Decreased strength;Cardiopulmonary status limiting activity;Decreased activity tolerance;Decreased balance;Decreased mobility       PT Treatment Interventions DME instruction;Therapeutic exercise;Gait training;Balance  training;Stair training;Neuromuscular re-education;Functional mobility training;Patient/family education;Therapeutic activities    PT Goals (Current goals can be found in the Care Plan section)  Acute Rehab PT Goals Patient Stated Goal: Patient wants to go home PT Goal Formulation: With patient Time For Goal Achievement: 09/11/17 Potential to Achieve Goals: Good    Frequency Min 2X/week   Barriers to discharge        Co-evaluation               AM-PAC PT "6 Clicks" Daily Activity  Outcome Measure Difficulty turning over in bed (including adjusting bedclothes, sheets and blankets)?: A Little Difficulty moving from lying on back to sitting on the side of the bed? : A Little Difficulty sitting down on and standing up from a chair with arms (e.g., wheelchair, bedside commode, etc,.)?: A Little Help needed moving to and from a bed to chair (including a wheelchair)?: A Little Help needed walking in hospital room?: A Little Help needed climbing 3-5 steps with a railing? : A Lot 6 Click Score: 17    End of Session Equipment Utilized During Treatment: Gait belt;Oxygen Activity Tolerance: Patient limited by fatigue Patient left: in chair;with chair alarm set;with nursing/sitter in room;with call bell/phone within reach;with SCD's reapplied Nurse Communication: Mobility status PT Visit Diagnosis: Unsteadiness on feet (R26.81);Other abnormalities of gait and mobility (R26.89);Muscle weakness (generalized) (M62.81)    Time: 6761-9509 PT Time Calculation (min) (ACUTE ONLY): 24 min   Charges:   PT Evaluation $PT Eval Low Complexity: 1 Low PT Treatments $Therapeutic Activity: 8-22 mins   PT G Codes:        Lieutenant Diego PT, DPT [phone] AM,08/28/17 [phone]

## 2017-08-28 NOTE — Care Management (Signed)
Notified by Corene Cornea from [place] that patient is open with RN, PT, OT and aide.

## 2017-08-29 ENCOUNTER — Encounter: Payer: Self-pay | Admitting: Unknown Physician Specialty

## 2017-08-29 LAB — GLUCOSE, CAPILLARY
GLUCOSE-CAPILLARY: 210 mg/dL — AB (ref 70–99)
GLUCOSE-CAPILLARY: 176 mg/dL — AB (ref 70–99)
Glucose-Capillary: 130 mg/dL — ABNORMAL HIGH (ref 70–99)

## 2017-08-29 LAB — BASIC METABOLIC PANEL
ANION GAP: 7 (ref 5–15)
BUN: 49 mg/dL — AB (ref 8–23)
CO2: 26 mmol/L (ref 22–32)
Calcium: 8.5 mg/dL — ABNORMAL LOW (ref 8.9–10.3)
Chloride: 105 mmol/L (ref 98–111)
Creatinine, Ser: 2 mg/dL — ABNORMAL HIGH (ref 0.44–1.00)
GFR calc Af Amer: 27 mL/min — ABNORMAL LOW (ref >60)
GFR calc non Af Amer: 23 mL/min — ABNORMAL LOW (ref >60)
GLUCOSE: 184 mg/dL — AB (ref 70–99)
POTASSIUM: 4.9 mmol/L (ref 3.5–5.1)
Sodium: 138 mmol/L (ref 135–145)

## 2017-08-29 MED ORDER — PREDNISONE 10 MG PO TABS: 10.0000 mg | ORAL_TABLET | Freq: Every day | ORAL | 0 refills | Status: DC

## 2017-08-29 NOTE — Discharge Summary (Signed)
[facility] NAME: Katherine [name]    MR#:  [id number]  DATE OF BIRTH:  15-May-1940  DATE OF ADMISSION:  08/27/2017 ADMITTING PHYSICIAN: Hillary Bow, MD  DATE OF DISCHARGE: 08/29/2017  PRIMARY CARE PHYSICIAN: [name], NP   ADMISSION DIAGNOSIS:  Angioedema, initial encounter [T78.3XXA] Allergic reaction Acute on chronic respiratory failure Acute COPD exacerbation  acute encephalopathy secondary to hypercapnia  DISCHARGE DIAGNOSIS:  Active Problems:   COPD exacerbation (HCC) Allergic reaction Acute on chronic respiratory failure Acute COPD exacerbation Encephalopathy secondary to hypercapnia Angioedema  SECONDARY DIAGNOSIS:   Past Medical History:  Diagnosis Date  . Angioedema   . Anxiety   . Anxiety and depression   . Chronic constipation   . Chronic kidney disease    stage 3  . COPD (chronic obstructive pulmonary disease) ([place])   . Gallstones   . GERD (gastroesophageal reflux disease)   . Hypertension   . Left ventricular hypertrophy   . Osteoarthritis   . Prurigo nodularis   . Tobacco abuse   . Vitamin D deficiency disease      ADMITTING HISTORY Katherine [name]  is a 77 y.o. female with a known history of COPD, hypertension presented to the emergency room brought in by the husband due to facial swelling and tongue swelling.  Patient was also noticed to be wheezing.  Extremely tachypneic and started on BiPAP.  Was given Solu-Medrol.  Patient slowly improved and was given a dose of IV Ativan due to anxiety.  Presently patient is drowsy and unable to contribute to history.  BiPAP has been taken off but continues to have significant tongue swelling and tachypnea.  History obtained from husband and reviewing old records. Chest x-ray showed no acute abnormalities.  Not on ACE inhibitors.  No recent change in medications.  HOSPITAL COURSE:  Patient was admitted to medical floor.  Initially was put on BiPAP and  stabilized and was given IV Solu-Medrol, IV Benadryl and nebulization treatments.  BiPAP was taken off secondary to tongue swelling.  Patient tolerated steroids well her swelling of the tongue and lips decreased.  She tolerated diet well.  Patient received aggressive nebulization treatments along with IV steroids and her breathing improved shortness of breath improved and.  She was comfortable on oxygen via nasal cannula at baseline patient's mental status is also back to baseline.  After hypercapnia resolved.  Patient not on any ACE inhibitors at home.  [name] home with home health services.  CONSULTS OBTAINED:  None  DRUG ALLERGIES:   Allergies  Allergen Reactions  . Bee Venom Swelling  . Enalapril Maleate Swelling  . Other     Bee stings    DISCHARGE MEDICATIONS:   Allergies as of 08/29/2017      Reactions   Bee Venom Swelling   Enalapril Maleate Swelling   Other    Bee stings      Medication List    TAKE these medications   acetaminophen 650 MG CR tablet Commonly known as:  TYLENOL Take 650-1,300 mg by mouth every 8 (eight) hours as needed for pain.   albuterol 108 (90 Base) MCG/ACT inhaler Commonly known as:  PROVENTIL HFA Inhale 2 puffs into the lungs every 4 (four) hours as needed for wheezing or shortness of breath.   albuterol 0.63 MG/3ML nebulizer solution Commonly known as:  ACCUNEB Take 3 mLs (0.63 mg total) by nebulization 3 (three) times daily as needed for wheezing. FY:B01.9  atorvastatin 80 MG tablet Commonly known as:  LIPITOR Take 80 mg by mouth daily.   cetirizine 10 MG tablet Commonly known as:  ZYRTEC TAKE 1/2 TABLET(5 MG) BY MOUTH AT BEDTIME   cholecalciferol 1000 units tablet Commonly known as:  VITAMIN D Take 1,000 Units by mouth daily.   clopidogrel 75 MG tablet Commonly known as:  PLAVIX Take 1 tablet (75 mg total) by mouth daily.   diltiazem 120 MG 24 hr capsule Commonly known as:  CARDIZEM CD Take 120 mg by  mouth daily.   FLUoxetine 10 MG capsule Commonly known as:  PROZAC TAKE 1 CAPSULE(10 MG) BY MOUTH DAILY   Fluticasone-Umeclidin-Vilant 100-62.5-25 MCG/INH Aepb Commonly known as:  TRELEGY ELLIPTA Inhale 1 puff into the lungs daily.   gabapentin 300 MG capsule Commonly known as:  NEURONTIN Take 300 mg by mouth at bedtime.   hydrALAZINE 25 MG tablet Commonly known as:  APRESOLINE Take 25 mg by mouth 3 (three) times daily.   ipratropium 0.03 % nasal spray Commonly known as:  ATROVENT Place 2 sprays into both nostrils every 12 (twelve) hours.   ipratropium-albuterol 0.5-2.5 (3) MG/3ML Soln Commonly known as:  DUONEB USE 1 VIAL VIA NEBULIZER EVERY 6 HOURS AS NEEDED   methocarbamol 500 MG tablet Commonly known as:  ROBAXIN Take 1 tablet (500MG ) by mouth every 8 hours as needed for muscle spasms   omeprazole 40 MG capsule Commonly known as:  PRILOSEC Take 40 mg by mouth daily.   predniSONE 10 MG tablet Commonly known as:  DELTASONE Take 1 tablet (10 mg total) by mouth daily. Label  & dispense according to the schedule below.  6 tablets day one, then 5 table day 2, then 4 tablets day 3, then 3 tablets day 4, 2 tablets day 5, then 1 tablet day 6, then stop What changed:    how much to take  how to take this  when to take this  additional instructions   promethazine 12.5 MG tablet Commonly known as:  PHENERGAN Take 12.5 mg by mouth every 6 (six) hours as needed for nausea or vomiting.   senna 8.6 MG tablet Commonly known as:  SENOKOT Take 2 tablets by mouth at bedtime as needed for constipation.   tiotropium 18 MCG inhalation capsule Commonly known as:  SPIRIVA Place 18 mcg into inhaler and inhale daily.       Today  Patient seen and evaluated today No shortness of breath No swelling of the tongue and lips Comfortable on oxygen by nasal cannula  VITAL SIGNS:  Blood pressure (!) 151/83, pulse 75, temperature 98.2 F (36.8 C), temperature source Oral, resp.  rate 18, height 5' (1.524 m), weight 120.9 kg (266 lb 8 oz), SpO2 97 %.  I/O:    Intake/Output Summary (Last 24 hours) at 08/29/2017 1337 Last data filed at 08/29/2017 1321 Gross per 24 hour  Intake 480 ml  Output 750 ml  Net -270 ml    PHYSICAL EXAMINATION:  Physical Exam  GENERAL:  77 y.o.-year-old patient lying in the bed with no acute distress.  LUNGS: Normal breath sounds bilaterally, no wheezing, rales,rhonchi or crepitation. No use of accessory muscles of respiration.  CARDIOVASCULAR: S1, S2 normal. No murmurs, rubs, or gallops.  ABDOMEN: Soft, non-tender, non-distended. Bowel sounds present. No organomegaly or mass.  NEUROLOGIC: Moves all 4 extremities. PSYCHIATRIC: The patient is alert and oriented x 3.  SKIN: No obvious rash, lesion, or ulcer.   DATA REVIEW:   CBC Recent Labs  Lab 08/28/17 0607  WBC 8.6  HGB 10.3*  HCT 32.7*  PLT 263    Chemistries  Recent Labs  Lab 08/27/17 1219  08/29/17 0406  NA 142   < > 138  K 5.5*   < > 4.9  CL 104   < > 105  CO2 30   < > 26  GLUCOSE 105*   < > 184*  BUN 33*   < > 49*  CREATININE 2.17*   < > 2.00*  CALCIUM 9.1   < > 8.5*  AST 18  --   --   ALT 16  --   --   ALKPHOS 66  --   --   BILITOT 0.4  --   --    < > = values in this interval not displayed.    Cardiac Enzymes Recent Labs  Lab 08/27/17 1219  TROPONINI <0.03    Microbiology Results  Results for orders placed or performed during the hospital encounter of 08/27/17  MRSA PCR Screening     Status: None   Collection Time: 08/27/17  4:50 PM  Result Value Ref Range Status   MRSA by PCR NEGATIVE NEGATIVE Final    Comment:        The GeneXpert MRSA Assay (FDA approved for NASAL specimens only), is one component of a comprehensive MRSA colonization surveillance program. It is not intended to diagnose MRSA infection nor to guide or monitor treatment for MRSA infections. Performed at [facility], [address], [place], [place]  [address]     RADIOLOGY:  Dg Chest 1 View  Result Date: 08/27/2017 CLINICAL DATA:  77 year old female with shortness of breath. EXAM: CHEST  1 VIEW COMPARISON:  Chest radiograph 07/24/2017 and earlier. FINDINGS: Portable AP semi upright view at 1350 hours. Stable mild cardiomegaly. Stable tortuous and calcified thoracic aorta. Stable lung volumes since June, lower than in May. Stable pulmonary vascularity. No pneumothorax. No pleural effusion or consolidation. Increased streaky bibasilar opacity that most resembles atelectasis. Paucity of bowel gas in the upper abdomen. Visualized tracheal air column is within normal limits. IMPRESSION: 1. Continued low lung volumes. Increased streaky bibasilar opacity most resembles atelectasis. 2. Stable cardiomegaly and calcified aortic atherosclerosis. Electronically Signed   By: Genevie Ann M.D.   On: 08/27/2017 14:23    Follow up with PCP in 1 week.  Management plans discussed with the patient, family and they are in agreement.  CODE STATUS: Full code    Code Status Orders  (From admission, onward)        Start     Ordered   08/27/17 1543  Full code  Continuous     08/27/17 1543    Code Status History    Date Active Date Inactive Code Status Order ID Comments User Context   06/28/2017 1414 06/30/2017 1748 Full Code [id number]  Gorden Harms, MD Inpatient   06/12/2017 0858 06/16/2017 1659 DNR [id number]  Demetrios Loll, MD Inpatient   05/26/2017 1732 05/30/2017 1446 DNR [id number]  Hillary Bow, MD ED   05/17/2017 1228 05/21/2017 1511 DNR [id number]  Asencion Gowda, NP Inpatient   04/21/2017 0117 04/22/2017 1750 DNR [id number]  Lance Coon, MD Inpatient   04/05/2017 1411 04/08/2017 2101 Full Code [id number]  Saundra Shelling, MD Inpatient   02/08/2016 1414 02/11/2016 1729 Full Code [id number]  Hessie Knows, MD Inpatient   11/27/2015 1645 11/29/2015 1628 Full Code [id number]  Idelle Crouch, MD Inpatient   02/10/2015 2122 02/12/2015 1809 Full Code [id number]  Lytle Butte, MD ED   09/16/2014 1220 09/19/2014 1736 Full Code [id number]  Aldean Jewett, MD Inpatient      TOTAL TIME TAKING CARE OF THIS PATIENT ON DAY OF DISCHARGE: more than 35 minutes.   Saundra Shelling M.D on 08/29/2017 at 1:37 PM  Between 7am to 6pm - Pager - [phone]  After 6pm go to [email or web address] - password [email or web address] [facility]  Office  [phone]  CC: Primary care physician; [name], NP  Note: This dictation was prepared with Dragon dictation along with smaller phrase technology. Any transcriptional errors that result from this process are unintentional.

## 2017-08-29 NOTE — Care Management Note (Signed)
Case Management Note  Patient Details  Name: Katherine [name] MRN: [id number] Date of Birth: [birth year]   Patient to discharge today.[name]  Resumptions orders in place for home health.  Corene Cornea with [facility] notified of discharge.   Subjective/Objective:                    Action/Plan:   Expected Discharge Date:  08/29/17               Expected Discharge Plan:  [place]  In-House Referral:     Discharge planning Services  CM Consult  Post Acute Care Choice:  Home Health, Resumption of Svcs/PTA Provider Choice offered to:  Patient  DME Arranged:    DME Agency:     HH Arranged:  RN, PT, OT, Nurse's Aide [place] Agency:  [facility]  Status of Service:  Completed, signed off  If discussed at [place] of Stay Meetings, dates discussed:    Additional Comments:  Beverly Sessions, RN 08/29/2017, 2:19 PM

## 2017-08-29 NOTE — Progress Notes (Signed)
Katherine [name] to be D/C'd Home per MD order.  Discussed prescriptions and follow up appointments with the patient. Prescriptions given to patient, medication list explained in detail. Pt verbalized understanding.  Allergies as of 08/29/2017      Reactions   Bee Venom Swelling   Enalapril Maleate Swelling   Other    Bee stings      Medication List    TAKE these medications   acetaminophen 650 MG CR tablet Commonly known as:  TYLENOL Take 650-1,300 mg by mouth every 8 (eight) hours as needed for pain.   albuterol 108 (90 Base) MCG/ACT inhaler Commonly known as:  PROVENTIL HFA Inhale 2 puffs into the lungs every 4 (four) hours as needed for wheezing or shortness of breath.   albuterol 0.63 MG/3ML nebulizer solution Commonly known as:  ACCUNEB Take 3 mLs (0.63 mg total) by nebulization 3 (three) times daily as needed for wheezing. DX:J44.9   atorvastatin 80 MG tablet Commonly known as:  LIPITOR Take 80 mg by mouth daily.   cetirizine 10 MG tablet Commonly known as:  ZYRTEC TAKE 1/2 TABLET(5 MG) BY MOUTH AT BEDTIME   cholecalciferol 1000 units tablet Commonly known as:  VITAMIN D Take 1,000 Units by mouth daily.   clopidogrel 75 MG tablet Commonly known as:  PLAVIX Take 1 tablet (75 mg total) by mouth daily.   diltiazem 120 MG 24 hr capsule Commonly known as:  CARDIZEM CD Take 120 mg by mouth daily.   FLUoxetine 10 MG capsule Commonly known as:  PROZAC TAKE 1 CAPSULE(10 MG) BY MOUTH DAILY   Fluticasone-Umeclidin-Vilant 100-62.5-25 MCG/INH Aepb Commonly known as:  TRELEGY ELLIPTA Inhale 1 puff into the lungs daily.   gabapentin 300 MG capsule Commonly known as:  NEURONTIN Take 300 mg by mouth at bedtime.   hydrALAZINE 25 MG tablet Commonly known as:  APRESOLINE Take 25 mg by mouth 3 (three) times daily.   ipratropium 0.03 % nasal spray Commonly known as:  ATROVENT Place 2 sprays into both nostrils every 12 (twelve) hours.   ipratropium-albuterol 0.5-2.5 (3)  MG/3ML Soln Commonly known as:  DUONEB USE 1 VIAL VIA NEBULIZER EVERY 6 HOURS AS NEEDED   methocarbamol 500 MG tablet Commonly known as:  ROBAXIN Take 1 tablet (500MG ) by mouth every 8 hours as needed for muscle spasms   omeprazole 40 MG capsule Commonly known as:  PRILOSEC Take 40 mg by mouth daily.   predniSONE 10 MG tablet Commonly known as:  DELTASONE Take 1 tablet (10 mg total) by mouth daily. Label  & dispense according to the schedule below.  6 tablets day one, then 5 table day 2, then 4 tablets day 3, then 3 tablets day 4, 2 tablets day 5, then 1 tablet day 6, then stop What changed:    how much to take  how to take this  when to take this  additional instructions   promethazine 12.5 MG tablet Commonly known as:  PHENERGAN Take 12.5 mg by mouth every 6 (six) hours as needed for nausea or vomiting.   senna 8.6 MG tablet Commonly known as:  SENOKOT Take 2 tablets by mouth at bedtime as needed for constipation.   tiotropium 18 MCG inhalation capsule Commonly known as:  SPIRIVA Place 18 mcg into inhaler and inhale daily.       Vitals:   08/29/17 0503 08/29/17 1144  BP: (!) 158/92 (!) 151/83  Pulse: 68 75  Resp: (!) 22 18  Temp: 98.3 F (36.8 C) 98.2  F (36.8 C)  SpO2: 95% 97%    Skin clean, dry and intact without evidence of skin break down, no evidence of skin tears noted. IV catheter discontinued intact. Site without signs and symptoms of complications. Dressing and pressure applied. Pt denies pain at this time. No complaints noted.  An After Visit Summary was printed and given to the patient. Patient escorted via [place], and D/C home via private auto.  Sharalyn Ink

## 2017-08-31 ENCOUNTER — Other Ambulatory Visit: Payer: Self-pay | Admitting: Unknown Physician Specialty

## 2017-08-31 ENCOUNTER — Telehealth: Payer: Self-pay

## 2017-08-31 MED ORDER — CLOPIDOGREL BISULFATE 75 MG PO TABS: 75.0000 mg | ORAL_TABLET | Freq: Every day | ORAL | 0 refills | Status: DC

## 2017-08-31 NOTE — Telephone Encounter (Signed)
Transition Care Management Follow-up Telephone Call  Spoke with patients husband for tcm call.    How have you been since you were released from the hospital? "shes doing okay today, she is resting"  Do you understand why you were in the hospital? yes  Do you have a copy of your discharge instructions Yes Do you understand the discharge instrcutions? yes  Where were you discharged to? Home  Do you have support at home? Yes    Items Reviewed:  Medications obtained Yes  Medications reviewed: Yes  Dietary changes reviewed: yes  Home Health? Yes, [facility]- they contacted pt yesterday for home visit   DME ordered at discharge obtained? NA  Medical supplies: NA    Functional Questionnaire:   Activities of Daily Living (ADLs):   She states they are independent in the following: feeding, continence and toileting States they require assistance with the following: ambulation, bathing and hygiene, grooming, dressing and medication management  Any transportation issues/concerns?: no  Any patient concerns? Yes, requesting refill on plavix- she is completely out  Confirmed importance and date/time of follow-up visits scheduled with PCP: yes 09/05/2017 at 10:00am with [place] appointment scheduled with specialist? Yes  Confirmed with patient if condition begins to worsen call PCP or If it's emergency go to the ER.

## 2017-08-31 NOTE — Addendum Note (Signed)
Addended by: [name] on: 08/31/2017 12:03 PM   Modules accepted: Orders

## 2017-09-02 ENCOUNTER — Other Ambulatory Visit: Payer: Self-pay | Admitting: Family Medicine

## 2017-09-02 DIAGNOSIS — J441 Chronic obstructive pulmonary disease with (acute) exacerbation: Secondary | ICD-10-CM | POA: Diagnosis not present

## 2017-09-02 DIAGNOSIS — I2510 Atherosclerotic heart disease of native coronary artery without angina pectoris: Secondary | ICD-10-CM | POA: Diagnosis not present

## 2017-09-02 DIAGNOSIS — I69320 Aphasia following cerebral infarction: Secondary | ICD-10-CM | POA: Diagnosis not present

## 2017-09-02 DIAGNOSIS — J9612 Chronic respiratory failure with hypercapnia: Secondary | ICD-10-CM | POA: Diagnosis not present

## 2017-09-02 DIAGNOSIS — J9611 Chronic respiratory failure with hypoxia: Secondary | ICD-10-CM | POA: Diagnosis not present

## 2017-09-02 DIAGNOSIS — I129 Hypertensive chronic kidney disease with stage 1 through stage 4 chronic kidney disease, or unspecified chronic kidney disease: Secondary | ICD-10-CM | POA: Diagnosis not present

## 2017-09-02 DIAGNOSIS — E875 Hyperkalemia: Secondary | ICD-10-CM | POA: Diagnosis not present

## 2017-09-02 DIAGNOSIS — N183 Chronic kidney disease, stage 3 (moderate): Secondary | ICD-10-CM | POA: Diagnosis not present

## 2017-09-02 DIAGNOSIS — I270 Primary pulmonary hypertension: Secondary | ICD-10-CM | POA: Diagnosis not present

## 2017-09-03 ENCOUNTER — Telehealth: Payer: Self-pay | Admitting: Unknown Physician Specialty

## 2017-09-03 DIAGNOSIS — J9612 Chronic respiratory failure with hypercapnia: Secondary | ICD-10-CM | POA: Diagnosis not present

## 2017-09-03 DIAGNOSIS — E875 Hyperkalemia: Secondary | ICD-10-CM | POA: Diagnosis not present

## 2017-09-03 DIAGNOSIS — I129 Hypertensive chronic kidney disease with stage 1 through stage 4 chronic kidney disease, or unspecified chronic kidney disease: Secondary | ICD-10-CM | POA: Diagnosis not present

## 2017-09-03 DIAGNOSIS — J9611 Chronic respiratory failure with hypoxia: Secondary | ICD-10-CM | POA: Diagnosis not present

## 2017-09-03 DIAGNOSIS — I69320 Aphasia following cerebral infarction: Secondary | ICD-10-CM | POA: Diagnosis not present

## 2017-09-03 DIAGNOSIS — I2510 Atherosclerotic heart disease of native coronary artery without angina pectoris: Secondary | ICD-10-CM | POA: Diagnosis not present

## 2017-09-03 DIAGNOSIS — N183 Chronic kidney disease, stage 3 (moderate): Secondary | ICD-10-CM | POA: Diagnosis not present

## 2017-09-03 DIAGNOSIS — J449 Chronic obstructive pulmonary disease, unspecified: Secondary | ICD-10-CM | POA: Diagnosis not present

## 2017-09-03 DIAGNOSIS — J441 Chronic obstructive pulmonary disease with (acute) exacerbation: Secondary | ICD-10-CM | POA: Diagnosis not present

## 2017-09-03 DIAGNOSIS — I270 Primary pulmonary hypertension: Secondary | ICD-10-CM | POA: Diagnosis not present

## 2017-09-03 NOTE — Telephone Encounter (Signed)
Yes please resume home health

## 2017-09-03 NOTE — Telephone Encounter (Signed)
Copied from [place] [id number]. Topic: General - Other >> Sep 03, 2017  2:41 PM Yvette Rack wrote: Reason for CRM: PT Derwood Kaplan [phone] from [place] calling for verbal orders  Twice a week for 4 weeks for weakness decrease endurance and stamina and at risk for falls

## 2017-09-03 NOTE — Telephone Encounter (Signed)
Copied from [place] [id number]. Topic: Quick Communication - See Telephone Encounter >> Sep 03, 2017  7:43 AM Synthia Innocent wrote: CRM for notification. See Telephone encounter for: 09/03/17. [place] Needing verbal ok to resume pervious home health orders

## 2017-09-03 NOTE — Telephone Encounter (Signed)
Verbal orders given  

## 2017-09-04 NOTE — Telephone Encounter (Signed)
Called and gave verbal orders to [place] per [place].

## 2017-09-05 ENCOUNTER — Other Ambulatory Visit: Payer: Self-pay | Admitting: Unknown Physician Specialty

## 2017-09-05 ENCOUNTER — Ambulatory Visit (INDEPENDENT_AMBULATORY_CARE_PROVIDER_SITE_OTHER): Payer: Medicare HMO | Admitting: Unknown Physician Specialty

## 2017-09-05 ENCOUNTER — Other Ambulatory Visit: Payer: Self-pay

## 2017-09-05 ENCOUNTER — Encounter: Payer: Self-pay | Admitting: Unknown Physician Specialty

## 2017-09-05 VITALS — BP 116/79 | HR 91 | Temp 98.4°F | Ht 60.0 in | Wt 269.0 lb

## 2017-09-05 DIAGNOSIS — K219 Gastro-esophageal reflux disease without esophagitis: Secondary | ICD-10-CM

## 2017-09-05 DIAGNOSIS — N183 Chronic kidney disease, stage 3 unspecified: Secondary | ICD-10-CM

## 2017-09-05 DIAGNOSIS — Z09 Encounter for follow-up examination after completed treatment for conditions other than malignant neoplasm: Secondary | ICD-10-CM

## 2017-09-05 DIAGNOSIS — J449 Chronic obstructive pulmonary disease, unspecified: Secondary | ICD-10-CM

## 2017-09-05 DIAGNOSIS — I10 Essential (primary) hypertension: Secondary | ICD-10-CM

## 2017-09-05 DIAGNOSIS — Z789 Other specified health status: Secondary | ICD-10-CM

## 2017-09-05 DIAGNOSIS — R768 Other specified abnormal immunological findings in serum: Secondary | ICD-10-CM | POA: Diagnosis not present

## 2017-09-05 DIAGNOSIS — J849 Interstitial pulmonary disease, unspecified: Secondary | ICD-10-CM | POA: Diagnosis not present

## 2017-09-05 HISTORY — DX: Other specified abnormal immunological findings in serum: R76.8

## 2017-09-05 MED ORDER — GABAPENTIN 300 MG PO CAPS: 300.0000 mg | ORAL_CAPSULE | Freq: Every day | ORAL | 0 refills | Status: DC

## 2017-09-05 MED ORDER — PANTOPRAZOLE SODIUM 40 MG PO TBEC: 40.0000 mg | DELAYED_RELEASE_TABLET | Freq: Every day | ORAL | 3 refills | Status: DC

## 2017-09-05 MED ORDER — CLOPIDOGREL BISULFATE 75 MG PO TABS: 75.0000 mg | ORAL_TABLET | Freq: Every day | ORAL | 0 refills | Status: DC

## 2017-09-05 MED ORDER — CLOPIDOGREL BISULFATE 75 MG PO TABS: 75.0000 mg | ORAL_TABLET | Freq: Every day | ORAL | 1 refills | Status: DC

## 2017-09-05 MED ORDER — HYDRALAZINE HCL 25 MG PO TABS: 25.0000 mg | ORAL_TABLET | Freq: Three times a day (TID) | ORAL | 1 refills | Status: DC

## 2017-09-05 NOTE — Assessment & Plan Note (Signed)
Refer back to [facility].  Palliative cae consult also requested

## 2017-09-05 NOTE — Patient Outreach (Signed)
[facility]) Care Management  09/05/2017  Tabithia Stroder [birth year] [id number]   Telephone Screen  Referral Date: 09/05/17 Referral Source: MD office-Urgent Referral Reason: " patient is back with [place] and not at [facility] that was set up by children and not her or her husband's wishes, COPD, CKD stage 3, hospital discharge, frequent admission" Insurance: [facility]   Outreach attempt # 1 to patient. Contacted main number listed for patient and spouse answered the phone. He voiced that number was his cell and the best number to reach patient at was [phone]. RN CM made outreach attempt to number provided. No answer and HIPAA compliant voicemail message left along with contact info.      Plan: RN CM will make outreach attempt to patient within 3-4 business days. RN CM will send unsuccessful outreach letter to patient.    Enzo Montgomery, RN,BSN,CCM [facility] Telephonic Care Management Coordinator Direct Phone: [phone] Toll Free: [phone] Fax: [phone]

## 2017-09-05 NOTE — Progress Notes (Addendum)
BP 116/79   Pulse 91   Temp 98.4 F (36.9 C) (Oral)   Ht 5' (1.524 m)   Wt 269 lb (122 kg)   SpO2 94%   BMI 52.54 kg/m    Subjective:    Patient ID: Katherine [name], female    DOB: [birth year], 77 y.o.   MRN: [id number]  HPI: Katherine [name] is a 77 y.o. female  Chief Complaint  Patient presents with  . Hospitalization Follow-up   S/p hospitalization.  This time secondary to angioedema causing respiratory distress.  Notes reviewed.  She is back to baseline.  Pt stats she is "tired" of going back and forth to the hospital.  This reaction happened despite me putting her on prednisone for a markedly elevated RA, sed rate, and CRP.  Discharge not reviewed.   Due to such high levels, evaluate for interstitial lung disease through high resolution CT scan.    Today she needs Hydralazine and Gabapentin refills.    Relevant past medical, surgical, family and social history reviewed and updated as indicated. Interim medical history since our last visit reviewed. Allergies and medications reviewed and updated.  Review of Systems  Per HPI unless specifically indicated above     Objective:    BP 116/79   Pulse 91   Temp 98.4 F (36.9 C) (Oral)   Ht 5' (1.524 m)   Wt 269 lb (122 kg)   SpO2 94%   BMI 52.54 kg/m   Wt Readings from Last 3 Encounters:  10/04/17 277 lb 5.4 oz (125.8 kg)  10/03/17 272 lb 1.6 oz (123.4 kg)  09/26/17 274 lb (124.3 kg)    Physical Exam  Constitutional: She is oriented to person, place, and time. She appears well-developed and well-nourished. No distress.  HENT:  Head: Normocephalic and atraumatic.  Eyes: Conjunctivae and lids are normal. Right eye exhibits no discharge. Left eye exhibits no discharge. No scleral icterus.  Neck: Normal range of motion. Neck supple. No JVD present. Carotid bruit is not present.  Cardiovascular: Normal rate, regular rhythm and normal heart sounds.  Pulmonary/Chest:  DOE Walking into the office.  Comfortable once  settled onto procedure exam table  Abdominal: Normal appearance. There is no splenomegaly or hepatomegaly.  Musculoskeletal: Normal range of motion.  Neurological: She is alert and oriented to person, place, and time.  Skin: Skin is warm, dry and intact. No rash noted. No pallor.  Psychiatric: She has a normal mood and affect. Her behavior is normal. Judgment and thought content normal.    Results for orders placed or performed during the hospital encounter of 08/27/17  MRSA PCR Screening  Result Value Ref Range   MRSA by PCR NEGATIVE NEGATIVE  CBC with Differential  Result Value Ref Range   WBC 7.9 3.6 - 11.0 K/uL   RBC 3.86 3.80 - 5.20 MIL/uL   Hemoglobin 10.9 (L) 12.0 - 16.0 g/dL   HCT 34.4 (L) 35.0 - 47.0 %   MCV 89.1 80.0 - 100.0 fL   MCH 28.4 26.0 - 34.0 pg   MCHC 31.8 (L) 32.0 - 36.0 g/dL   RDW 16.4 (H) 11.5 - 14.5 %   Platelets 325 150 - 440 K/uL   Neutrophils Relative % 77 %   Neutro Abs 6.1 1.4 - 6.5 K/uL   Lymphocytes Relative 14 %   Lymphs Abs 1.1 1.0 - 3.6 K/uL   Monocytes Relative 7 %   Monocytes Absolute 0.5 0.2 - 0.9 K/uL   Eosinophils Relative 1 %  Eosinophils Absolute 0.1 0 - 0.7 K/uL   Basophils Relative 1 %   Basophils Absolute 0.1 0 - 0.1 K/uL  Comprehensive metabolic panel  Result Value Ref Range   Sodium 142 135 - 145 mmol/L   Potassium 5.5 (H) 3.5 - 5.1 mmol/L   Chloride 104 98 - 111 mmol/L   CO2 30 22 - 32 mmol/L   Glucose, Bld 105 (H) 70 - 99 mg/dL   BUN 33 (H) 8 - 23 mg/dL   Creatinine, Ser 2.17 (H) 0.44 - 1.00 mg/dL   Calcium 9.1 8.9 - 10.3 mg/dL   Total Protein 7.4 6.5 - 8.1 g/dL   Albumin 3.2 (L) 3.5 - 5.0 g/dL   AST 18 15 - 41 U/L   ALT 16 0 - 44 U/L   Alkaline Phosphatase 66 38 - 126 U/L   Total Bilirubin 0.4 0.3 - 1.2 mg/dL   GFR calc non Af Amer 21 (L) >60 mL/min   GFR calc Af Amer 24 (L) >60 mL/min   Anion gap 8 5 - 15  Troponin I  Result Value Ref Range   Troponin I <0.03 <0.03 ng/mL  Blood gas, venous  Result Value Ref  Range   pH, Ven 7.30 7.250 - 7.430   pCO2, Ven 68 (H) 44.0 - 60.0 mmHg   pO2, Ven <31.0 (LL) 32.0 - 45.0 mmHg   Bicarbonate 33.5 (H) 20.0 - 28.0 mmol/L   Acid-Base Excess 5.3 (H) 0.0 - 2.0 mmol/L   Patient temperature 37.0    Collection site VEIN    Sample type VENOUS   CK  Result Value Ref Range   Total CK 59 38 - 234 U/L  Basic metabolic panel  Result Value Ref Range   Sodium 141 135 - 145 mmol/L   Potassium 5.2 (H) 3.5 - 5.1 mmol/L   Chloride 106 98 - 111 mmol/L   CO2 24 22 - 32 mmol/L   Glucose, Bld 139 (H) 70 - 99 mg/dL   BUN 36 (H) 8 - 23 mg/dL   Creatinine, Ser 1.82 (H) 0.44 - 1.00 mg/dL   Calcium 8.9 8.9 - 10.3 mg/dL   GFR calc non Af Amer 26 (L) >60 mL/min   GFR calc Af Amer 30 (L) >60 mL/min   Anion gap 11 5 - 15  Basic metabolic panel  Result Value Ref Range   Sodium 141 135 - 145 mmol/L   Potassium 5.6 (H) 3.5 - 5.1 mmol/L   Chloride 104 98 - 111 mmol/L   CO2 28 22 - 32 mmol/L   Glucose, Bld 145 (H) 70 - 99 mg/dL   BUN 34 (H) 8 - 23 mg/dL   Creatinine, Ser 2.03 (H) 0.44 - 1.00 mg/dL   Calcium 9.1 8.9 - 10.3 mg/dL   GFR calc non Af Amer 23 (L) >60 mL/min   GFR calc Af Amer 26 (L) >60 mL/min   Anion gap 9 5 - 15  Glucose, capillary  Result Value Ref Range   Glucose-Capillary 138 (H) 70 - 99 mg/dL   Comment 1 Notify RN   CBC  Result Value Ref Range   WBC 8.6 3.6 - 11.0 K/uL   RBC 3.67 (L) 3.80 - 5.20 MIL/uL   Hemoglobin 10.3 (L) 12.0 - 16.0 g/dL   HCT 32.7 (L) 35.0 - 47.0 %   MCV 89.2 80.0 - 100.0 fL   MCH 28.0 26.0 - 34.0 pg   MCHC 31.4 (L) 32.0 - 36.0 g/dL   RDW 16.6 (H)  11.5 - 14.5 %   Platelets 263 150 - 440 K/uL  Protime-INR  Result Value Ref Range   Prothrombin Time 12.3 11.4 - 15.2 seconds   INR 0.92   APTT  Result Value Ref Range   aPTT <24 (L) 24 - 36 seconds  Glucose, capillary  Result Value Ref Range   Glucose-Capillary 122 (H) 70 - 99 mg/dL  Glucose, capillary  Result Value Ref Range   Glucose-Capillary 115 (H) 70 - 99 mg/dL    Glucose, capillary  Result Value Ref Range   Glucose-Capillary 238 (H) 70 - 99 mg/dL  Glucose, capillary  Result Value Ref Range   Glucose-Capillary 151 (H) 70 - 99 mg/dL  Basic metabolic panel  Result Value Ref Range   Sodium 138 135 - 145 mmol/L   Potassium 4.9 3.5 - 5.1 mmol/L   Chloride 105 98 - 111 mmol/L   CO2 26 22 - 32 mmol/L   Glucose, Bld 184 (H) 70 - 99 mg/dL   BUN 49 (H) 8 - 23 mg/dL   Creatinine, Ser 2.00 (H) 0.44 - 1.00 mg/dL   Calcium 8.5 (L) 8.9 - 10.3 mg/dL   GFR calc non Af Amer 23 (L) >60 mL/min   GFR calc Af Amer 27 (L) >60 mL/min   Anion gap 7 5 - 15  Glucose, capillary  Result Value Ref Range   Glucose-Capillary 215 (H) 70 - 99 mg/dL  Glucose, capillary  Result Value Ref Range   Glucose-Capillary 130 (H) 70 - 99 mg/dL   Comment 1 Notify RN   Glucose, capillary  Result Value Ref Range   Glucose-Capillary 210 (H) 70 - 99 mg/dL   Comment 1 Notify RN   Glucose, capillary  Result Value Ref Range   Glucose-Capillary 176 (H) 70 - 99 mg/dL   Comment 1 Notify RN       Assessment & Plan:   Problem List Items Addressed This Visit      Unprioritized   CKD (chronic kidney disease), stage III ([place])    Appt pending to Nephrology.  Husband planning on making appt.        Relevant Orders   Consult to [place] [facility] (Completed)   COPD (chronic obstructive pulmonary disease) ([place]) - Primary   Relevant Orders   Consult to [place] [facility] (Completed)   Elevated rheumatoid factor    Markedly elevated with elevated ESR and CRP.  Referral to rheumatology pending.  Prednisone seemed of little benefit.        Essential hypertension    Refilled Hydralazine with close f/u of BP      Frequent hospital admissions    Refer back to [facility].  Palliative cae consult also requested      GERD (gastroesophageal reflux disease)    Change Omeprazole to Pantoprazole due to plavix interaction with Omeprazole       Other Visit Diagnoses    Interstitial pulmonary  disease ([place])       Susected.  Order high resolution chest CT   Relevant Orders   CT Chest High Resolution   Hospital discharge follow-up           Follow up plan: Return in about 1 month (around 10/03/2017).

## 2017-09-05 NOTE — Telephone Encounter (Signed)
Just FYI.

## 2017-09-05 NOTE — Assessment & Plan Note (Signed)
Markedly elevated with elevated ESR and CRP.  Referral to rheumatology pending.  Prednisone seemed of little benefit.

## 2017-09-05 NOTE — Assessment & Plan Note (Signed)
Change Omeprazole to Pantoprazole due to plavix interaction with Omeprazole

## 2017-09-05 NOTE — Telephone Encounter (Signed)
LOV 08/22/17 Merrie Roof Has Apresoline been stopped?

## 2017-09-05 NOTE — Assessment & Plan Note (Signed)
Appt pending to Nephrology.  Husband planning on making appt.

## 2017-09-05 NOTE — Assessment & Plan Note (Signed)
Refilled Hydralazine with close f/u of BP

## 2017-09-05 NOTE — Patient Instructions (Signed)
Change Omeprazole to Pantoprazole.

## 2017-09-06 ENCOUNTER — Other Ambulatory Visit: Payer: Self-pay

## 2017-09-06 DIAGNOSIS — I270 Primary pulmonary hypertension: Secondary | ICD-10-CM | POA: Diagnosis not present

## 2017-09-06 DIAGNOSIS — N183 Chronic kidney disease, stage 3 (moderate): Secondary | ICD-10-CM | POA: Diagnosis not present

## 2017-09-06 DIAGNOSIS — J441 Chronic obstructive pulmonary disease with (acute) exacerbation: Secondary | ICD-10-CM | POA: Diagnosis not present

## 2017-09-06 DIAGNOSIS — J9611 Chronic respiratory failure with hypoxia: Secondary | ICD-10-CM | POA: Diagnosis not present

## 2017-09-06 DIAGNOSIS — E875 Hyperkalemia: Secondary | ICD-10-CM | POA: Diagnosis not present

## 2017-09-06 DIAGNOSIS — I129 Hypertensive chronic kidney disease with stage 1 through stage 4 chronic kidney disease, or unspecified chronic kidney disease: Secondary | ICD-10-CM | POA: Diagnosis not present

## 2017-09-06 DIAGNOSIS — J9612 Chronic respiratory failure with hypercapnia: Secondary | ICD-10-CM | POA: Diagnosis not present

## 2017-09-06 DIAGNOSIS — I69320 Aphasia following cerebral infarction: Secondary | ICD-10-CM | POA: Diagnosis not present

## 2017-09-06 DIAGNOSIS — I2510 Atherosclerotic heart disease of native coronary artery without angina pectoris: Secondary | ICD-10-CM | POA: Diagnosis not present

## 2017-09-06 NOTE — Patient Outreach (Signed)
[place] [facility]) Care Management  09/06/2017  [name] 23-Nov-1940 [id number]    Telephone Screen  Referral Date: 09/05/17 Referral Source: MD office-Urgent Referral Reason: " patient is back with [place] and not at [facility] that was set up by children and not her or her husband's wishes, COPD, CKD stage 3, hospital discharge, frequent admission" Insurance: [employer]   Outreach attempt #2 to patient. No answer at present and unable to leave message. RN CM received return call from number provided by spouse to contact patient. Caller voices that she was not patient and does not know patient and number is incorrect phone number for patient.      Plan: RN CM will make outreach attempt to patient within 3-4 business days.  Katherine Montgomery, RN,BSN,CCM [place] [facility] Telephonic Care Management Coordinator Direct Phone: [phone] Toll Free: [phone] Fax: [phone]

## 2017-09-07 NOTE — Telephone Encounter (Signed)
Copied from [place] [id number]. Topic: General - Other >> Sep 06, 2017  2:29 PM Adelene Idler wrote: Rip Harbour from [facility] is calling in requesting verbal orders for Nursing twice a week and 1 time a week for the next two weeks and the home health aide to start tomorrow  and twice a week for 2 more weeks    Cb# [id number] >> Sep 06, 2017  3:08 PM Amada Kingfisher, CMA wrote: Verbal orders okay?   Verbal orders given

## 2017-09-10 ENCOUNTER — Ambulatory Visit: Payer: Medicare HMO

## 2017-09-10 ENCOUNTER — Other Ambulatory Visit: Payer: Self-pay

## 2017-09-10 DIAGNOSIS — J441 Chronic obstructive pulmonary disease with (acute) exacerbation: Secondary | ICD-10-CM | POA: Diagnosis not present

## 2017-09-10 DIAGNOSIS — I129 Hypertensive chronic kidney disease with stage 1 through stage 4 chronic kidney disease, or unspecified chronic kidney disease: Secondary | ICD-10-CM | POA: Diagnosis not present

## 2017-09-10 DIAGNOSIS — J9611 Chronic respiratory failure with hypoxia: Secondary | ICD-10-CM | POA: Diagnosis not present

## 2017-09-10 DIAGNOSIS — I2510 Atherosclerotic heart disease of native coronary artery without angina pectoris: Secondary | ICD-10-CM | POA: Diagnosis not present

## 2017-09-10 DIAGNOSIS — E875 Hyperkalemia: Secondary | ICD-10-CM | POA: Diagnosis not present

## 2017-09-10 DIAGNOSIS — N183 Chronic kidney disease, stage 3 (moderate): Secondary | ICD-10-CM | POA: Diagnosis not present

## 2017-09-10 DIAGNOSIS — J9612 Chronic respiratory failure with hypercapnia: Secondary | ICD-10-CM | POA: Diagnosis not present

## 2017-09-10 DIAGNOSIS — I270 Primary pulmonary hypertension: Secondary | ICD-10-CM | POA: Diagnosis not present

## 2017-09-10 DIAGNOSIS — I69320 Aphasia following cerebral infarction: Secondary | ICD-10-CM | POA: Diagnosis not present

## 2017-09-10 NOTE — Patient Outreach (Signed)
[place] [facility]) Care Management  09/10/2017  Katherine [name] Aug 16, 1940 [id number]   Telephone Screen  Referral Date:09/05/17 Referral Source:MD office-Urgent Referral Reason:" patient is back with [place] and not at [facility] that was set up by children and not her or her husband's wishes, COPD, CKD stage 3, hospital discharge, frequent admission" [place] attempt # 3 to patient. No answer at present and unable to leave message.     Plan: RN CM will close case if no response from letter mailed to patient.   Enzo Montgomery, RN,BSN,CCM [place] [facility] Telephonic Care Management Coordinator Direct Phone: [phone] Toll Free: [phone] Fax: [phone]

## 2017-09-11 ENCOUNTER — Encounter: Payer: Self-pay | Admitting: Emergency Medicine

## 2017-09-11 ENCOUNTER — Emergency Department: Payer: Medicare HMO

## 2017-09-11 ENCOUNTER — Other Ambulatory Visit: Payer: Self-pay

## 2017-09-11 ENCOUNTER — Inpatient Hospital Stay
Admission: EM | Admit: 2017-09-11 | Discharge: 2017-09-15 | DRG: 190 | Disposition: A | Discharge status: Home Health | Payer: Medicare HMO | Attending: Internal Medicine | Admitting: Internal Medicine

## 2017-09-11 DIAGNOSIS — Z7902 Long term (current) use of antithrombotics/antiplatelets: Secondary | ICD-10-CM | POA: Diagnosis not present

## 2017-09-11 DIAGNOSIS — E875 Hyperkalemia: Secondary | ICD-10-CM | POA: Diagnosis present

## 2017-09-11 DIAGNOSIS — J209 Acute bronchitis, unspecified: Secondary | ICD-10-CM | POA: Diagnosis present

## 2017-09-11 DIAGNOSIS — R0602 Shortness of breath: Secondary | ICD-10-CM | POA: Diagnosis not present

## 2017-09-11 DIAGNOSIS — E785 Hyperlipidemia, unspecified: Secondary | ICD-10-CM | POA: Diagnosis present

## 2017-09-11 DIAGNOSIS — I129 Hypertensive chronic kidney disease with stage 1 through stage 4 chronic kidney disease, or unspecified chronic kidney disease: Secondary | ICD-10-CM | POA: Diagnosis present

## 2017-09-11 DIAGNOSIS — K219 Gastro-esophageal reflux disease without esophagitis: Secondary | ICD-10-CM | POA: Diagnosis present

## 2017-09-11 DIAGNOSIS — F419 Anxiety disorder, unspecified: Secondary | ICD-10-CM | POA: Diagnosis present

## 2017-09-11 DIAGNOSIS — Z7951 Long term (current) use of inhaled steroids: Secondary | ICD-10-CM

## 2017-09-11 DIAGNOSIS — J440 Chronic obstructive pulmonary disease with acute lower respiratory infection: Secondary | ICD-10-CM | POA: Diagnosis not present

## 2017-09-11 DIAGNOSIS — Z79899 Other long term (current) drug therapy: Secondary | ICD-10-CM

## 2017-09-11 DIAGNOSIS — F418 Other specified anxiety disorders: Secondary | ICD-10-CM | POA: Diagnosis present

## 2017-09-11 DIAGNOSIS — E1122 Type 2 diabetes mellitus with diabetic chronic kidney disease: Secondary | ICD-10-CM | POA: Diagnosis present

## 2017-09-11 DIAGNOSIS — E559 Vitamin D deficiency, unspecified: Secondary | ICD-10-CM | POA: Diagnosis present

## 2017-09-11 DIAGNOSIS — R079 Chest pain, unspecified: Secondary | ICD-10-CM | POA: Diagnosis not present

## 2017-09-11 DIAGNOSIS — J9621 Acute and chronic respiratory failure with hypoxia: Secondary | ICD-10-CM | POA: Diagnosis not present

## 2017-09-11 DIAGNOSIS — J441 Chronic obstructive pulmonary disease with (acute) exacerbation: Secondary | ICD-10-CM | POA: Diagnosis present

## 2017-09-11 DIAGNOSIS — Z6841 Body Mass Index (BMI) 40.0 and over, adult: Secondary | ICD-10-CM

## 2017-09-11 DIAGNOSIS — Z87891 Personal history of nicotine dependence: Secondary | ICD-10-CM

## 2017-09-11 DIAGNOSIS — N183 Chronic kidney disease, stage 3 (moderate): Secondary | ICD-10-CM | POA: Diagnosis not present

## 2017-09-11 DIAGNOSIS — Z9981 Dependence on supplemental oxygen: Secondary | ICD-10-CM

## 2017-09-11 DIAGNOSIS — Z96651 Presence of right artificial knee joint: Secondary | ICD-10-CM | POA: Diagnosis present

## 2017-09-11 DIAGNOSIS — E1165 Type 2 diabetes mellitus with hyperglycemia: Secondary | ICD-10-CM | POA: Diagnosis not present

## 2017-09-11 DIAGNOSIS — Z888 Allergy status to other drugs, medicaments and biological substances status: Secondary | ICD-10-CM | POA: Diagnosis not present

## 2017-09-11 DIAGNOSIS — F329 Major depressive disorder, single episode, unspecified: Secondary | ICD-10-CM | POA: Diagnosis present

## 2017-09-11 DIAGNOSIS — Z91030 Bee allergy status: Secondary | ICD-10-CM

## 2017-09-11 DIAGNOSIS — J189 Pneumonia, unspecified organism: Secondary | ICD-10-CM | POA: Diagnosis not present

## 2017-09-11 DIAGNOSIS — J449 Chronic obstructive pulmonary disease, unspecified: Secondary | ICD-10-CM

## 2017-09-11 LAB — BASIC METABOLIC PANEL
ANION GAP: 8 (ref 5–15)
BUN: 39 mg/dL — ABNORMAL HIGH (ref 8–23)
CHLORIDE: 104 mmol/L (ref 98–111)
CO2: 28 mmol/L (ref 22–32)
CREATININE: 1.95 mg/dL — AB (ref 0.44–1.00)
Calcium: 8.6 mg/dL — ABNORMAL LOW (ref 8.9–10.3)
GFR calc non Af Amer: 24 mL/min — ABNORMAL LOW (ref >60)
GFR, EST AFRICAN AMERICAN: 28 mL/min — AB (ref >60)
Glucose, Bld: 149 mg/dL — ABNORMAL HIGH (ref 70–99)
Potassium: 5.4 mmol/L — ABNORMAL HIGH (ref 3.5–5.1)
SODIUM: 140 mmol/L (ref 135–145)

## 2017-09-11 LAB — TROPONIN I

## 2017-09-11 LAB — CBC
HCT: 32.5 % — ABNORMAL LOW (ref 35.0–47.0)
Hemoglobin: 10.7 g/dL — ABNORMAL LOW (ref 12.0–16.0)
MCH: 28.9 pg (ref 26.0–34.0)
MCHC: 32.9 g/dL (ref 32.0–36.0)
MCV: 87.9 fL (ref 80.0–100.0)
PLATELETS: 166 10*3/uL (ref 150–440)
RBC: 3.7 MIL/uL — AB (ref 3.80–5.20)
RDW: 18.2 % — ABNORMAL HIGH (ref 11.5–14.5)
WBC: 11.3 10*3/uL — AB (ref 3.6–11.0)

## 2017-09-11 MED ORDER — CLOPIDOGREL BISULFATE 75 MG PO TABS
75.0000 mg | ORAL_TABLET | Freq: Every day | ORAL | Status: DC
  Administered 2017-09-11 – 2017-09-15 (×5): 75 mg via ORAL
  Filled 2017-09-11 (×5): qty 1

## 2017-09-11 MED ORDER — PANTOPRAZOLE SODIUM 40 MG PO TBEC
40.0000 mg | DELAYED_RELEASE_TABLET | Freq: Every day | ORAL | Status: DC
  Administered 2017-09-11 – 2017-09-15 (×4): 40 mg via ORAL
  Filled 2017-09-11 (×5): qty 1

## 2017-09-11 MED ORDER — SENNA 8.6 MG PO TABS: 2.0000 | ORAL_TABLET | Freq: Every evening | ORAL | Status: DC | PRN

## 2017-09-11 MED ORDER — METHYLPREDNISOLONE SODIUM SUCC 125 MG IJ SOLR
60.0000 mg | Freq: Four times a day (QID) | INTRAMUSCULAR | Status: DC
  Administered 2017-09-11 – 2017-09-12 (×3): 60 mg via INTRAVENOUS
  Filled 2017-09-11 (×3): qty 2

## 2017-09-11 MED ORDER — DEXAMETHASONE SODIUM PHOSPHATE 10 MG/ML IJ SOLN
10.0000 mg | Freq: Once | INTRAMUSCULAR | Status: AC
  Administered 2017-09-11: 10 mg via INTRAMUSCULAR
  Filled 2017-09-11: qty 1

## 2017-09-11 MED ORDER — DILTIAZEM HCL ER COATED BEADS 120 MG PO CP24
120.0000 mg | ORAL_CAPSULE | Freq: Every day | ORAL | Status: DC
  Administered 2017-09-11 – 2017-09-15 (×5): 120 mg via ORAL
  Filled 2017-09-11 (×5): qty 1

## 2017-09-11 MED ORDER — ATORVASTATIN CALCIUM 20 MG PO TABS
80.0000 mg | ORAL_TABLET | Freq: Every day | ORAL | Status: DC
  Administered 2017-09-11 – 2017-09-15 (×5): 80 mg via ORAL
  Filled 2017-09-11 (×5): qty 4

## 2017-09-11 MED ORDER — IPRATROPIUM-ALBUTEROL 0.5-2.5 (3) MG/3ML IN SOLN
3.0000 mL | Freq: Once | RESPIRATORY_TRACT | Status: AC
  Administered 2017-09-11: 3 mL via RESPIRATORY_TRACT
  Filled 2017-09-11: qty 3

## 2017-09-11 MED ORDER — SODIUM CHLORIDE 0.9 % IV SOLN
500.0000 mg | Freq: Once | INTRAVENOUS | Status: AC
  Administered 2017-09-11: 500 mg via INTRAVENOUS
  Filled 2017-09-11: qty 500

## 2017-09-11 MED ORDER — HEPARIN SODIUM (PORCINE) 5000 UNIT/ML IJ SOLN
5000.0000 [IU] | Freq: Three times a day (TID) | INTRAMUSCULAR | Status: DC
  Administered 2017-09-11 – 2017-09-15 (×11): 5000 [IU] via SUBCUTANEOUS
  Filled 2017-09-11 (×11): qty 1

## 2017-09-11 MED ORDER — SODIUM CHLORIDE 0.9 % IV SOLN
1.0000 g | INTRAVENOUS | Status: AC
  Administered 2017-09-11 – 2017-09-12 (×2): 1 g via INTRAVENOUS
  Filled 2017-09-11: qty 1
  Filled 2017-09-11: qty 10

## 2017-09-11 MED ORDER — AZITHROMYCIN 500 MG PO TABS
500.0000 mg | ORAL_TABLET | ORAL | Status: DC
  Administered 2017-09-12 – 2017-09-14 (×3): 500 mg via ORAL
  Filled 2017-09-11 (×3): qty 1

## 2017-09-11 MED ORDER — FLUTICASONE FUROATE-VILANTEROL 100-25 MCG/INH IN AEPB
1.0000 | INHALATION_SPRAY | Freq: Every day | RESPIRATORY_TRACT | Status: DC
  Administered 2017-09-11 – 2017-09-15 (×5): 1 via RESPIRATORY_TRACT
  Filled 2017-09-11: qty 28

## 2017-09-11 MED ORDER — LORATADINE 10 MG PO TABS
10.0000 mg | ORAL_TABLET | Freq: Every day | ORAL | Status: DC
  Administered 2017-09-11 – 2017-09-15 (×5): 10 mg via ORAL
  Filled 2017-09-11 (×5): qty 1

## 2017-09-11 MED ORDER — METHOCARBAMOL 500 MG PO TABS
500.0000 mg | ORAL_TABLET | Freq: Four times a day (QID) | ORAL | Status: DC | PRN
Start: 2017-09-11 — End: 2017-09-15
  Administered 2017-09-11: 500 mg via ORAL
  Filled 2017-09-11: qty 1

## 2017-09-11 MED ORDER — GABAPENTIN 300 MG PO CAPS
300.0000 mg | ORAL_CAPSULE | Freq: Every day | ORAL | Status: DC
  Administered 2017-09-11 – 2017-09-14 (×4): 300 mg via ORAL
  Filled 2017-09-11 (×4): qty 1

## 2017-09-11 MED ORDER — UMECLIDINIUM BROMIDE 62.5 MCG/INH IN AEPB
1.0000 | INHALATION_SPRAY | Freq: Every day | RESPIRATORY_TRACT | Status: DC
  Administered 2017-09-11 – 2017-09-15 (×5): 1 via RESPIRATORY_TRACT
  Filled 2017-09-11: qty 7

## 2017-09-11 MED ORDER — GUAIFENESIN ER 600 MG PO TB12
600.0000 mg | ORAL_TABLET | Freq: Two times a day (BID) | ORAL | Status: DC
  Administered 2017-09-11 – 2017-09-15 (×8): 600 mg via ORAL
  Filled 2017-09-11 (×8): qty 1

## 2017-09-11 MED ORDER — FLUTICASONE-UMECLIDIN-VILANT 100-62.5-25 MCG/INH IN AEPB: 1.0000 | INHALATION_SPRAY | Freq: Every day | RESPIRATORY_TRACT | Status: DC

## 2017-09-11 MED ORDER — HYDRALAZINE HCL 25 MG PO TABS
25.0000 mg | ORAL_TABLET | Freq: Three times a day (TID) | ORAL | Status: DC
  Administered 2017-09-11 – 2017-09-15 (×11): 25 mg via ORAL
  Filled 2017-09-11 (×11): qty 1

## 2017-09-11 MED ORDER — IPRATROPIUM-ALBUTEROL 0.5-2.5 (3) MG/3ML IN SOLN
3.0000 mL | Freq: Four times a day (QID) | RESPIRATORY_TRACT | Status: DC
  Administered 2017-09-11 – 2017-09-12 (×2): 3 mL via RESPIRATORY_TRACT
  Filled 2017-09-11 (×2): qty 3

## 2017-09-11 MED ORDER — SODIUM POLYSTYRENE SULFONATE 15 GM/60ML PO SUSP
30.0000 g | Freq: Once | ORAL | Status: AC
  Administered 2017-09-11: 30 g via ORAL
  Filled 2017-09-11: qty 120

## 2017-09-11 MED ORDER — FLUOXETINE HCL 20 MG PO CAPS
20.0000 mg | ORAL_CAPSULE | Freq: Every day | ORAL | Status: DC
  Administered 2017-09-11 – 2017-09-15 (×5): 20 mg via ORAL
  Filled 2017-09-11 (×5): qty 1

## 2017-09-11 MED ORDER — ALBUTEROL SULFATE (2.5 MG/3ML) 0.083% IN NEBU
5.0000 mg | INHALATION_SOLUTION | Freq: Once | RESPIRATORY_TRACT | Status: AC
  Administered 2017-09-11: 5 mg via RESPIRATORY_TRACT
  Filled 2017-09-11: qty 6

## 2017-09-11 MED ORDER — VITAMIN D 1000 UNITS PO TABS
1000.0000 [IU] | ORAL_TABLET | Freq: Every day | ORAL | Status: DC
  Administered 2017-09-11 – 2017-09-15 (×5): 1000 [IU] via ORAL
  Filled 2017-09-11 (×5): qty 1

## 2017-09-11 NOTE — ED Triage Notes (Signed)
Pt presents to ED via EMS with c/o CP and SOB from home. Per EMS 12 lead unremarkable. Started approx 30 mins ago and progressively worsening, pt arrives doing 2 duonebs given by EMS PTA. Per EMS CP is on the R side, non radiating, BP 107/50, HR 70 NSR, O296%, pt normally on home O2 at 3L via [place]. EMS reports relief of CP with the duonebs.

## 2017-09-11 NOTE — ED Notes (Signed)
Pt brief changed and pt repositioned in bed.

## 2017-09-11 NOTE — ED Notes (Signed)
Annie Main RN aware of bed assigned

## 2017-09-11 NOTE — ED Notes (Signed)
RT called regarding BiPap for patient.

## 2017-09-11 NOTE — ED Provider Notes (Signed)
[facility] Emergency Department Provider Note ____________________________________________   First MD Initiated Contact with Patient 09/11/17 1404     (approximate)  I have reviewed the triage vital signs and the nursing notes.   HISTORY  Chief Complaint Chest Pain and Shortness of Breath   HPI Katherine [name] is a 77 y.o. female with a history of COPD on chronic nasal cannula oxygen who was presented to the emergency department with shortness of breath as well as chest pain which started 30 minutes prior to arrival.  He says that the chest pain is a pressure-like pain to the left side of her chest which is nonradiating.  Says it is associated with shortness of breath.  Says the pain is a 6 out of 10 at this time.  Says that it started slowly and has increased gradually.  Denies any cough or fever.  Patient received 2 DuoNeb's in route.   Past Medical History:  Diagnosis Date  . Angioedema   . Anxiety   . Anxiety and depression   . Chronic constipation   . Chronic kidney disease    stage 3  . COPD (chronic obstructive pulmonary disease) ([place])   . Gallstones   . GERD (gastroesophageal reflux disease)   . Hypertension   . Left ventricular hypertrophy   . Osteoarthritis   . Prurigo nodularis   . Tobacco abuse   . Vitamin D deficiency disease     Patient Active Problem List   Diagnosis Date Noted  . Elevated rheumatoid factor 09/05/2017  . COPD exacerbation ([place]) 08/27/2017  . Frequent hospital admissions 08/22/2017  . Aphasia 06/28/2017  . Acute on chronic respiratory failure with hypoxia ([place]) 06/12/2017  . Hand pain 03/21/2017  . Dry skin 03/21/2017  . Pain in finger of left hand 09/12/2016  . Chronic fatigue 06/12/2016  . Left knee pain 06/12/2016  . Advanced care planning/counseling discussion 03/13/2016  . Primary localized osteoarthritis of right knee 02/08/2016  . OSA and COPD overlap syndrome ([place]) 09/16/2015  . Rotator cuff syndrome  09/07/2015  . 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 Sleep disturbance 04/14/2015  . Coronary artery disease 03/14/2015  . 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 Anxiety   . Tobacco abuse   . Prurigo nodularis   . GERD (gastroesophageal reflux disease)   . COPD (chronic obstructive pulmonary disease) ([place])   . Osteoarthritis   . Vitamin D deficiency disease   . Chronic constipation   . CKD (chronic kidney disease), stage III ([place])   . LVH (left ventricular hypertrophy) 05/20/2013  . Essential hypertension 05/20/2013  . Morbid obesity ([place]) 05/20/2013    Past Surgical History:  Procedure Laterality Date  . CHOLECYSTECTOMY    . 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   Procedure: TOTAL KNEE ARTHROPLASTY;  Surgeon: Hessie Knows, MD;  Location: [facility] ORS;  Service: Orthopedics;  Laterality: Right;    Prior to Admission medications   Medication Sig Start Date End Date Taking? Authorizing Provider  acetaminophen (TYLENOL) 650 MG CR tablet Take 650-1,300 mg by mouth every 8 (eight) hours as needed for pain.     [provider]  albuterol (ACCUNEB) 0.63 MG/3ML nebulizer solution Take 3 mLs (0.63 mg total) by nebulization 3 (three) times daily as needed for wheezing. DX:J44.9 07/23/17   [name], MD  albuterol (PROVENTIL HFA) 108 (90 Base) MCG/ACT inhaler Inhale 2 puffs into the lungs every  4 (four) hours as needed for wheezing or shortness of breath. 05/30/17   Loletha Grayer, MD  atorvastatin (LIPITOR) 80 MG tablet Take 80 mg by mouth daily. 08/02/17   [provider]  cetirizine (ZYRTEC) 10 MG tablet TAKE 1/2 TABLET(5 MG) BY MOUTH AT BEDTIME 07/25/17   Johnson, Megan P, DO  cholecalciferol (VITAMIN D) 1000 units tablet Take 1,000 Units by mouth daily.    [provider]  clopidogrel (PLAVIX) 75 MG tablet Take 1 tablet (75 mg total) by mouth daily. 09/05/17   Katherine Haddock, NP  diltiazem  (CARDIZEM CD) 120 MG 24 hr capsule Take 120 mg by mouth daily.    [provider]  FLUoxetine (PROZAC) 10 MG capsule TAKE 1 CAPSULE(10 MG) BY MOUTH DAILY 07/25/17   Johnson, Megan P, DO  Fluticasone-Umeclidin-Vilant (TRELEGY ELLIPTA) 100-62.5-25 MCG/INH AEPB Inhale 1 puff into the lungs daily. 08/09/17   [name], MD  gabapentin (NEURONTIN) 300 MG capsule Take 1 capsule (300 mg total) by mouth at bedtime. 09/05/17   Katherine Haddock, NP  hydrALAZINE (APRESOLINE) 25 MG tablet TAKE 1 TABLET(25 MG) BY MOUTH TWICE DAILY 09/05/17   Katherine Haddock, NP  hydrALAZINE (APRESOLINE) 25 MG tablet Take 1 tablet (25 mg total) by mouth 3 (three) times daily. 09/05/17   Katherine Haddock, NP  ipratropium (ATROVENT) 0.03 % nasal spray Place 2 sprays into both nostrils every 12 (twelve) hours.    [provider]  ipratropium-albuterol (DUONEB) 0.5-2.5 (3) MG/3ML SOLN USE 1 VIAL VIA NEBULIZER EVERY 6 HOURS AS NEEDED 07/23/17   [name], MD  methocarbamol (ROBAXIN) 500 MG tablet Take 1 tablet (500MG ) by mouth every 8 hours as needed for muscle spasms 08/08/17   [provider]  pantoprazole (PROTONIX) 40 MG tablet Take 1 tablet (40 mg total) by mouth daily. 09/05/17   Katherine Haddock, NP  predniSONE (DELTASONE) 10 MG tablet Take 1 tablet (10 mg total) by mouth daily. Label  & dispense according to the schedule below.  6 tablets day one, then 5 table day 2, then 4 tablets day 3, then 3 tablets day 4, 2 tablets day 5, then 1 tablet day 6, then stop 08/29/17   Saundra Shelling, MD  promethazine (PHENERGAN) 12.5 MG tablet Take 12.5 mg by mouth every 6 (six) hours as needed for nausea or vomiting.     [provider]  senna (SENOKOT) 8.6 MG tablet Take 2 tablets by mouth at bedtime as needed for constipation.     [provider]  tiotropium (SPIRIVA) 18 MCG inhalation capsule Place 18 mcg into inhaler and inhale daily.    [provider]    Allergies Bee venom; Enalapril  maleate; and Other  Family History  Problem Relation Age of Onset  . Alcohol abuse Mother   . Sickle cell anemia Daughter   . Hypertension Son   . Cancer Neg Hx   . COPD Neg Hx   . Diabetes Neg Hx   . Heart disease Neg Hx   . Stroke Neg Hx     Social History Social History   Tobacco Use  . Smoking status: Former Smoker    Packs/day: 0.50    Years: 60.00    Pack years: 30.00    Types: Cigarettes  . Smokeless tobacco: Never Used  Substance Use Topics  . Alcohol use: No    Alcohol/week: 0.0 oz    Comment: rare  . Drug use: No    Review of Systems  Constitutional: No  fever/chills Eyes: No visual changes. ENT: No sore throat. Cardiovascular: As above Respiratory: As above Gastrointestinal: No abdominal pain.  No nausea, no vomiting.  No diarrhea.  No constipation. Genitourinary: Negative for dysuria. Musculoskeletal: Negative for back pain. Skin: Negative for rash. Neurological: Negative for headaches, focal weakness or numbness.   ____________________________________________   PHYSICAL EXAM:  VITAL SIGNS: ED Triage Vitals  Enc Vitals Group     BP 09/11/17 1401 111/76     Pulse Rate 09/11/17 1401 87     Resp 09/11/17 1401 (!) 26     Temp --      Temp Source 09/11/17 1401 Oral     SpO2 09/11/17 1401 97 %     Weight 09/11/17 1359 269 lb (122 kg)     Height 09/11/17 1359 5' (1.524 m)     Head Circumference --      Peak Flow --      Pain Score 09/11/17 1359 8     Pain Loc --      Pain Edu? --      Excl. in [place]? --     Constitutional: Alert and oriented. Well appearing and in no acute distress. Eyes: Conjunctivae are normal.  Head: Atraumatic. Nose: No congestion/rhinnorhea. Mouth/Throat: Mucous membranes are moist.  Neck: No stridor.   Cardiovascular: Normal rate, regular rhythm. Grossly normal heart sounds.   Respiratory: Labored respirations.  Diffuse wheezing with very minimal air movement throughout.  Prolonged expiratory phase. Gastrointestinal:  Soft and nontender. No distention. No CVA tenderness. Musculoskeletal: Mild to moderate bilateral lower extremity edema. Neurologic:  Normal speech and language. No gross focal neurologic deficits are appreciated. Skin:  Skin is warm, dry and intact. No rash noted. Psychiatric: Mood and affect are normal. Speech and behavior are normal.  ____________________________________________   LABS (all labs ordered are listed, but only abnormal results are displayed)  Labs Reviewed  BASIC METABOLIC PANEL - Abnormal; Notable for the following components:      Result Value   Potassium 5.4 (*)    Glucose, Bld 149 (*)    BUN 39 (*)    Creatinine, Ser 1.95 (*)    Calcium 8.6 (*)    GFR calc non Af Amer 24 (*)    GFR calc Af Amer 28 (*)    All other components within normal limits  CBC - Abnormal; Notable for the following components:   WBC 11.3 (*)    RBC 3.70 (*)    Hemoglobin 10.7 (*)    HCT 32.5 (*)    RDW 18.2 (*)    All other components within normal limits  TROPONIN I   ____________________________________________  EKG  ED ECG REPORT I, Doran Stabler, the attending physician, personally viewed and interpreted this ECG.   Date: 09/11/2017  EKG Time: 1400  Rate: 91  Rhythm: normal sinus rhythm  Axis: Left axis  Intervals:none  ST&T Change: No ST segment elevation or depression.  No abnormal T wave inversion.  ____________________________________________  RADIOLOGY  Low lung volumes with bibasilar atelectasis or infiltrates.  Component of bibasilar scarring may be present similar findings noted on prior exam ____________________________________________   PROCEDURES  Procedure(s) performed:   Procedures  Critical Care performed:   ____________________________________________   INITIAL IMPRESSION / ASSESSMENT AND PLAN / ED COURSE  Pertinent labs & imaging results that were available during my care of the patient were reviewed by me and considered in my  medical decision making (see chart for details).  Differential diagnosis includes,  but is not limited to, ACS, aortic dissection, pulmonary embolism, cardiac tamponade, pneumothorax, pneumonia, pericarditis, myocarditis, GI-related causes including esophagitis/gastritis, and musculoskeletal chest wall pain.   Differential includes, but is not limited to, viral syndrome, bronchitis including COPD exacerbation, pneumonia, reactive airway disease including asthma, CHF including exacerbation with or without pulmonary/interstitial edema, pneumothorax, ACS, thoracic trauma, and pulmonary embolism. As part of my medical decision making, I reviewed the following data within the electronic MEDICAL RECORD NUMBER Notes from prior ED visits  ----------------------------------------- 3:26 PM on 09/11/2017 -----------------------------------------  Patient says that the chest pain is gone with nebulizer treatments.  Attempted to place her on BiPAP at one point but the patient was not tolerating.  Patient with persistent wheezing and persistently poor air movement.  Will be admitted to the hospital for COPD exacerbation.  Patient is understanding the plan willing to comply.  Signed out to Dr. Jerelyn Charles. ____________________________________________   FINAL CLINICAL IMPRESSION(S) / ED DIAGNOSES  COPD exacerbation.  NEW MEDICATIONS STARTED DURING THIS VISIT:  New Prescriptions   No medications on file     Note:  This document was prepared using Dragon voice recognition software and may include unintentional dictation errors.     Orbie Pyo, MD 09/11/17 [phone]

## 2017-09-11 NOTE — ED Notes (Signed)
Pt husband contact information  Bennie Hind [phone]

## 2017-09-11 NOTE — ED Notes (Signed)
Pt changed by Linus Orn, EDT.

## 2017-09-11 NOTE — ED Notes (Signed)
Pt diaper changed and adjusted in bed.

## 2017-09-11 NOTE — H&P (Signed)
[facility] [place] NAME: Katherine [name]    MR#:  [id number]  DATE OF BIRTH:  [birth year]  DATE OF ADMISSION:  09/11/2017  PRIMARY CARE PHYSICIAN: [name], NP   REQUESTING/REFERRING PHYSICIAN:   CHIEF COMPLAINT:   Chief Complaint  Patient presents with  . Chest Pain  . Shortness of Breath    HISTORY OF PRESENT ILLNESS: Katherine [name]  is a 77 y.o. female with a known history per below presents to the emergency room via EMS for acute chest tightness with shortness of breath for 1 to 2 days which is worsening, patient noted to be hypoxic on arrival, placed on BiPAP-patient unable to tolerate so BiPAP was discontinued, complained of productive cough, ER work-up noted for infiltrates/atelectasis/cardiomegaly on x-ray, potassium 5.4, creatinine 1.9 near baseline, white count 11,000, patient evaluated emergency room at the bedside, resting comfortably on 3 L via nasal cannula which is her baseline, patient is now being admitted for acute on chronic obstructive pulmonary disease exacerbation secondary to community-acquired pneumonia.  PAST MEDICAL HISTORY:   Past Medical History:  Diagnosis Date  . Angioedema   . Anxiety   . Anxiety and depression   . Chronic constipation   . Chronic kidney disease    stage 3  . COPD (chronic obstructive pulmonary disease) ([place])   . Gallstones   . GERD (gastroesophageal reflux disease)   . Hypertension   . Left ventricular hypertrophy   . Osteoarthritis   . Prurigo nodularis   . Tobacco abuse   . Vitamin D deficiency disease     PAST SURGICAL HISTORY:  Past Surgical History:  Procedure Laterality Date  . CHOLECYSTECTOMY    . 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   Procedure: TOTAL KNEE ARTHROPLASTY;  Surgeon: Hessie Knows, MD;  Location: [facility] ORS;  Service: Orthopedics;  Laterality: Right;    SOCIAL HISTORY:  Social History   Tobacco Use  . Smoking  status: Former Smoker    Packs/day: 0.50    Years: 60.00    Pack years: 30.00    Types: Cigarettes  . Smokeless tobacco: Never Used  Substance Use Topics  . Alcohol use: No    Alcohol/week: 0.0 oz    Comment: rare    FAMILY HISTORY:  Family History  Problem Relation Age of Onset  . Alcohol abuse Mother   . Sickle cell anemia Daughter   . Hypertension Son   . Cancer Neg Hx   . COPD Neg Hx   . Diabetes Neg Hx   . Heart disease Neg Hx   . Stroke Neg Hx     DRUG ALLERGIES:  Allergies  Allergen Reactions  . Bee Venom Swelling  . Enalapril Maleate Swelling  . Other     Bee stings    REVIEW OF SYSTEMS:   CONSTITUTIONAL: No fever, +fatigue, weakness.  EYES: No blurred or double vision.  EARS, NOSE, AND THROAT: No tinnitus or ear pain.  RESPIRATORY: + cough, shortness of breath, wheezing.  CARDIOVASCULAR: + chest pain, no orthopnea, +edema.  GASTROINTESTINAL: No nausea, vomiting, diarrhea or abdominal pain.  GENITOURINARY: No dysuria, hematuria.  ENDOCRINE: No polyuria, nocturia,  HEMATOLOGY: No anemia, easy bruising or bleeding SKIN: No rash or lesion. MUSCULOSKELETAL: No joint pain or arthritis.   NEUROLOGIC: No tingling, numbness, weakness.  PSYCHIATRY: No anxiety or depression.   MEDICATIONS AT HOME:  Prior to Admission medications   Medication Sig Start Date  End Date Taking? Authorizing Provider  atorvastatin (LIPITOR) 80 MG tablet Take 80 mg by mouth daily. 08/02/17  Yes [provider]  cetirizine (ZYRTEC) 10 MG tablet TAKE 1/2 TABLET(5 MG) BY MOUTH AT BEDTIME 07/25/17  Yes Johnson, Megan P, DO  cholecalciferol (VITAMIN D) 1000 units tablet Take 1,000 Units by mouth daily.   Yes [provider]  clopidogrel (PLAVIX) 75 MG tablet Take 1 tablet (75 mg total) by mouth daily. 09/05/17  Yes [name], NP  diltiazem (CARDIZEM CD) 120 MG 24 hr capsule Take 120 mg by mouth daily.   Yes [provider]  FLUoxetine (PROZAC) 10 MG capsule TAKE  1 CAPSULE(10 MG) BY MOUTH DAILY 07/25/17  Yes Johnson, Megan P, DO  Fluticasone-Umeclidin-Vilant (TRELEGY ELLIPTA) 100-62.5-25 MCG/INH AEPB Inhale 1 puff into the lungs daily. 08/09/17  Yes [name], MD  gabapentin (NEURONTIN) 300 MG capsule Take 1 capsule (300 mg total) by mouth at bedtime. 09/05/17  Yes [name], NP  hydrALAZINE (APRESOLINE) 25 MG tablet Take 1 tablet (25 mg total) by mouth 3 (three) times daily. 09/05/17  Yes [name], NP  ipratropium (ATROVENT) 0.03 % nasal spray Place 2 sprays into both nostrils every 12 (twelve) hours.   Yes [provider]  ipratropium-albuterol (DUONEB) 0.5-2.5 (3) MG/3ML SOLN USE 1 VIAL VIA NEBULIZER EVERY 6 HOURS AS NEEDED 07/23/17  Yes [name], MD  pantoprazole (PROTONIX) 40 MG tablet Take 1 tablet (40 mg total) by mouth daily. 09/05/17  Yes [name], NP  tiotropium (SPIRIVA) 18 MCG inhalation capsule Place 18 mcg into inhaler and inhale daily.   Yes [provider]  acetaminophen (TYLENOL) 650 MG CR tablet Take 650-1,300 mg by mouth every 8 (eight) hours as needed for pain.     [provider]  albuterol (ACCUNEB) 0.63 MG/3ML nebulizer solution Take 3 mLs (0.63 mg total) by nebulization 3 (three) times daily as needed for wheezing. DX:[id number] 07/23/17   [name], MD  albuterol (PROVENTIL HFA) 108 (90 Base) MCG/ACT inhaler Inhale 2 puffs into the lungs every 4 (four) hours as needed for wheezing or shortness of breath. 05/30/17   Loletha Grayer, MD  methocarbamol (ROBAXIN) 500 MG tablet Take 1 tablet (500MG ) by mouth every 8 hours as needed for muscle spasms 08/08/17   [provider]  promethazine (PHENERGAN) 12.5 MG tablet Take 12.5 mg by mouth every 6 (six) hours as needed for nausea or vomiting.     [provider]  senna (SENOKOT) 8.6 MG tablet Take 2 tablets by mouth at bedtime as needed for constipation.     [provider]      PHYSICAL EXAMINATION:   VITAL  SIGNS: Blood pressure 132/84, pulse 76, resp. rate 17, height 5' (1.524 m), weight 122 kg (269 lb), SpO2 98 %.  GENERAL:  77 y.o.-year-old patient lying in the bed with no acute distress.  Morbid obesity, nontoxic-appearing diminished breath sounds throughout EYES: Pupils equal, round, reactive to light and accommodation. No scleral icterus. Extraocular muscles intact.  HEENT: Head atraumatic, normocephalic. Oropharynx and nasopharynx clear.  NECK:  Supple, no jugular venous distention. No thyroid enlargement, no tenderness.  LUNGS: Diminished breath sounds throughout.  Mild  use of accessory muscles of respiration.  CARDIOVASCULAR: S1, S2 normal. No murmurs, rubs, or gallops.  ABDOMEN: Soft, nontender, nondistended. Bowel sounds present. No organomegaly or mass.  EXTREMITIES: + pedal edema, no cyanosis, or clubbing.  NEUROLOGIC: Cranial nerves II through XII are intact. Muscle strength 5/5  in all extremities. Sensation intact. Gait not checked.  PSYCHIATRIC: The patient is alert and oriented x 3.  SKIN: No obvious rash, lesion, or ulcer.   LABORATORY PANEL:   CBC Recent Labs  Lab 09/11/17 1411  WBC 11.3*  HGB 10.7*  HCT 32.5*  PLT 166  MCV 87.9  MCH 28.9  MCHC 32.9  RDW 18.2*   ------------------------------------------------------------------------------------------------------------------  Chemistries  Recent Labs  Lab 09/11/17 1411  NA 140  K 5.4*  CL 104  CO2 28  GLUCOSE 149*  BUN 39*  CREATININE 1.95*  CALCIUM 8.6*   ------------------------------------------------------------------------------------------------------------------ estimated creatinine clearance is 29.9 mL/min (A) (by C-G formula based on SCr of 1.95 mg/dL (H)). ------------------------------------------------------------------------------------------------------------------ No results for input(s): TSH, T4TOTAL, T3FREE, THYROIDAB in the last 72 hours.  Invalid input(s): FREET3   Coagulation  profile No results for input(s): INR, PROTIME in the last 168 hours. ------------------------------------------------------------------------------------------------------------------- No results for input(s): DDIMER in the last 72 hours. -------------------------------------------------------------------------------------------------------------------  Cardiac Enzymes Recent Labs  Lab 09/11/17 1411  TROPONINI <0.03   ------------------------------------------------------------------------------------------------------------------ Invalid input(s): POCBNP  ---------------------------------------------------------------------------------------------------------------  Urinalysis    Component Value Date/Time   COLORURINE YELLOW (A) 07/07/2017 1845   APPEARANCEUR CLEAR (A) 07/07/2017 1845   APPEARANCEUR Clear 02/20/2013 1459   LABSPEC 1.011 07/07/2017 1845   LABSPEC 1.012 02/20/2013 1459   PHURINE 5.0 07/07/2017 1845   GLUCOSEU NEGATIVE 07/07/2017 1845   GLUCOSEU Negative 02/20/2013 1459   HGBUR NEGATIVE 07/07/2017 1845   BILIRUBINUR NEGATIVE 07/07/2017 1845   BILIRUBINUR Negative 02/20/2013 1459   KETONESUR NEGATIVE 07/07/2017 1845   PROTEINUR NEGATIVE 07/07/2017 1845   NITRITE NEGATIVE 07/07/2017 1845   LEUKOCYTESUR NEGATIVE 07/07/2017 1845   LEUKOCYTESUR Negative 02/20/2013 1459     RADIOLOGY: Dg Chest Portable 1 View  Result Date: 09/11/2017 CLINICAL DATA:  Chest pain.  Shortness of breath. EXAM: PORTABLE CHEST 1 VIEW COMPARISON:  08/27/2017.  04/05/2017.  11/28/2015. FINDINGS: Cardiomegaly with normal pulmonary vascularity. Low lung volumes with mild bibasilar atelectasis/infiltrates. A component of scarring may be present. No pleural effusion or pneumothorax. IMPRESSION: 1. Low lung volumes with mild bibasilar atelectasis/infiltrates. A component of bibasilar scarring may be present. Similar findings noted on prior exam. 2.  Cardiomegaly.  No pulmonary venous congestion.  Electronically Signed   By: Marcello Moores  Register   On: 09/11/2017 14:57    EKG: Orders placed or performed during the hospital encounter of 09/11/17  . ED EKG within 10 minutes  . ED EKG within 10 minutes  . EKG 12-Lead  . EKG 12-Lead    IMPRESSION AND PLAN: *Acute on chronic hypoxic respiratory failure-on 3 L chronically *Acute on COPD exacerbation due to pneumonia *Acute community-acquired pneumonia *Chronic kidney disease stage III-stable *Acute hyperkalemia  Admit to regular nursing floor bed on our pneumonia protocol, empiric Rocephin/azithromycin, follow-up on cultures, IV Solu-Medrol with tapering as tolerated, aggressive pulmonary toilet bronchodilator therapy, mucolytic agents, currently at baseline O2 requirement of 3 L via nasal cannula, Kayexalate x1, recheck BMP in the morning, avoid nephrotoxic agents, and continue close medical monitoring   All the records are reviewed and case discussed with ED provider. Management plans discussed with the patient, family and they are in agreement.  CODE STATUS:full Code Status History    Date Active Date Inactive Code Status Order ID Comments User Context   08/27/2017 1543 08/29/2017 2159 Full Code [id number]  Hillary Bow, MD ED   06/28/2017 1414 06/30/2017 1748 Full Code [id number]  Gorden Harms, MD Inpatient   06/12/2017 7517 06/16/2017 1659 DNR  [id number]  Demetrios Loll, MD Inpatient   05/26/2017 1732 05/30/2017 1446 DNR [id number]  Hillary Bow, MD ED   05/17/2017 1228 05/21/2017 1511 DNR [id number]  Asencion Gowda, NP Inpatient   04/21/2017 0117 04/22/2017 1750 DNR [id number]  Lance Coon, MD Inpatient   04/05/2017 1411 04/08/2017 2101 Full Code [id number]  Saundra Shelling, MD Inpatient   02/08/2016 1414 02/11/2016 1729 Full Code [id number]  Hessie Knows, MD Inpatient   11/27/2015 1645 11/29/2015 1628 Full Code [id number]  Idelle Crouch, MD Inpatient   02/10/2015 2122 02/12/2015 1809 Full Code [id number]  Lytle Butte, MD ED   09/16/2014  1220 09/19/2014 1736 Full Code [id number]  Aldean Jewett, MD Inpatient       TOTAL TIME TAKING CARE OF THIS PATIENT: 45 minutes.    Avel Peace Ashaya Raftery M.D on 09/11/2017   Between 7am to 6pm - Pager - [phone]  After 6pm go to [email or web address] - password [email or web address] [facility]  Office  [phone]  CC: Primary care physician; [name], NP   Note: This dictation was prepared with Dragon dictation along with smaller phrase technology. Any transcriptional errors that result from this process are unintentional.

## 2017-09-11 NOTE — Progress Notes (Signed)
Family Meeting Note  Advance Directive:yes  Today a meeting took place with the Patient.  Patient is able to participate   The following clinical team members were present during this meeting:MD  The following were discussed:Patient's diagnosis: Acute on chronic hypoxic respiratory failure, Patient's progosis: Unable to determine and Goals for treatment: Full Code  Additional follow-up to be provided: prn  Time spent during discussion:20 minutes  Gorden Harms, MD

## 2017-09-11 NOTE — ED Notes (Signed)
IV team at bedside to start IV, requested 2 IV due to patient being a difficult stick.

## 2017-09-11 NOTE — ED Notes (Signed)
Admitting MD at bedside.

## 2017-09-11 NOTE — Progress Notes (Addendum)
Patient will not tolerate BIPAP mask. Pulling mask off face saying she can't breath and starts to panic.  States she is breathing better now that mask is off her face.  HR 73  RR 28 oxygen saturations 95%. RN covering patient aware. MD also made aware.  BIPAP removed from patient's room.

## 2017-09-12 LAB — BASIC METABOLIC PANEL
ANION GAP: 8 (ref 5–15)
BUN: 37 mg/dL — AB (ref 8–23)
CALCIUM: 8.5 mg/dL — AB (ref 8.9–10.3)
CO2: 29 mmol/L (ref 22–32)
CREATININE: 1.79 mg/dL — AB (ref 0.44–1.00)
Chloride: 107 mmol/L (ref 98–111)
GFR calc Af Amer: 31 mL/min — ABNORMAL LOW (ref >60)
GFR, EST NON AFRICAN AMERICAN: 27 mL/min — AB (ref >60)
GLUCOSE: 209 mg/dL — AB (ref 70–99)
Potassium: 5.1 mmol/L (ref 3.5–5.1)
Sodium: 144 mmol/L (ref 135–145)

## 2017-09-12 LAB — EXPECTORATED SPUTUM ASSESSMENT W REFEX TO RESP CULTURE

## 2017-09-12 LAB — EXPECTORATED SPUTUM ASSESSMENT W GRAM STAIN, RFLX TO RESP C

## 2017-09-12 LAB — STREP PNEUMONIAE URINARY ANTIGEN: Strep Pneumo Urinary Antigen: NEGATIVE

## 2017-09-12 MED ORDER — CEFDINIR 300 MG PO CAPS
300.0000 mg | ORAL_CAPSULE | Freq: Two times a day (BID) | ORAL | Status: DC
  Administered 2017-09-13: 300 mg via ORAL
  Filled 2017-09-12 (×2): qty 1

## 2017-09-12 MED ORDER — INSULIN ASPART 100 UNIT/ML ~~LOC~~ SOLN
0.0000 [IU] | Freq: Three times a day (TID) | SUBCUTANEOUS | Status: DC
  Administered 2017-09-12 – 2017-09-13 (×2): 2 [IU] via SUBCUTANEOUS
  Administered 2017-09-13: 7 [IU] via SUBCUTANEOUS
  Administered 2017-09-13: 5 [IU] via SUBCUTANEOUS
  Administered 2017-09-14 (×2): 2 [IU] via SUBCUTANEOUS
  Administered 2017-09-15: 3 [IU] via SUBCUTANEOUS
  Filled 2017-09-12 (×7): qty 1

## 2017-09-12 MED ORDER — METHYLPREDNISOLONE SODIUM SUCC 40 MG IJ SOLR
40.0000 mg | Freq: Three times a day (TID) | INTRAMUSCULAR | Status: DC
  Administered 2017-09-12 – 2017-09-14 (×6): 40 mg via INTRAVENOUS
  Filled 2017-09-12 (×6): qty 1

## 2017-09-12 MED ORDER — IPRATROPIUM-ALBUTEROL 0.5-2.5 (3) MG/3ML IN SOLN
3.0000 mL | Freq: Three times a day (TID) | RESPIRATORY_TRACT | Status: DC
  Administered 2017-09-12 – 2017-09-15 (×9): 3 mL via RESPIRATORY_TRACT
  Filled 2017-09-12 (×9): qty 3

## 2017-09-12 MED ORDER — ACETAMINOPHEN 325 MG PO TABS
325.0000 mg | ORAL_TABLET | Freq: Four times a day (QID) | ORAL | Status: DC | PRN
  Administered 2017-09-12 – 2017-09-13 (×2): 650 mg via ORAL
  Filled 2017-09-12 (×2): qty 2

## 2017-09-12 NOTE — Progress Notes (Signed)
Inpatient Diabetes Program Recommendations  AACE/ADA: New Consensus Statement on Inpatient Glycemic Control (2019)  Target Ranges:  Prepandial:   less than 140 mg/dL      Peak postprandial:   less than 180 mg/dL (1-2 hours)      Critically ill patients:  140 - 180 mg/dL   Results for GIAH, FICKETT (MRN [id number]) as of 09/12/2017 11:39  Ref. Range 09/11/2017 14:11 09/12/2017 03:39  Glucose Latest Ref Range: 70 - 99 mg/dL 149 (H) 209 (H)   Review of Glycemic Control  Diabetes history: No Outpatient Diabetes medications: NA Current orders for Inpatient glycemic control: None  Inpatient Diabetes Program Recommendations: Correction (SSI): While inpatient and ordered steroids, please consider ordering CBGs with Novolog correction scale ACHS.  NOTE: Noted patient received Decadron 10 mg x 1 on 7/30 and ordered Solumedrol 60 mg Q6H which is likely cause of hyperglycemia.  Thanks, Barnie Alderman, RN, MSN, CDE Diabetes Coordinator Inpatient Diabetes Program [phone] (Team Pager from 8am to 5pm)

## 2017-09-12 NOTE — Plan of Care (Signed)
No pain noted. Will continue to monitor.

## 2017-09-12 NOTE — Progress Notes (Signed)
[facility] at [place] NAME: Katherine [name]    MR#:  [id number]  DATE OF BIRTH:  [birth year]  SUBJECTIVE:  CHIEF COMPLAINT: Shortness of breath is slightly better than yesterday. Still coughing and feeling tight in her chest  REVIEW OF SYSTEMS:  CONSTITUTIONAL: No fever, fatigue or weakness.  EYES: No blurred or double vision.  EARS, NOSE, AND THROAT: No tinnitus or ear pain.  RESPIRATORY:  reports cough, reporting minimal exertional shortness of breath, wheezing, no hemoptysis.  CARDIOVASCULAR: No chest pain, orthopnea, edema.  GASTROINTESTINAL: No nausea, vomiting, diarrhea or abdominal pain.  GENITOURINARY: No dysuria, hematuria.  ENDOCRINE: No polyuria, nocturia,  HEMATOLOGY: No anemia, easy bruising or bleeding SKIN: No rash or lesion. MUSCULOSKELETAL: No joint pain or arthritis.   NEUROLOGIC: No tingling, numbness, weakness.  PSYCHIATRY: No anxiety or depression.   DRUG ALLERGIES:   Allergies  Allergen Reactions  . Bee Venom Swelling  . Enalapril Maleate Swelling  . Other     Bee stings    VITALS:  Blood pressure (!) 159/88, pulse 63, temperature 98.8 F (37.1 C), temperature source Oral, resp. rate 18, height 5' (1.524 m), weight 122 kg (269 lb), SpO2 100 %.  PHYSICAL EXAMINATION:  GENERAL:  77 y.o.-year-old patient lying in the bed with no acute distress.  EYES: Pupils equal, round, reactive to light and accommodation. No scleral icterus. Extraocular muscles intact.  HEENT: Head atraumatic, normocephalic. Oropharynx and nasopharynx clear.  NECK:  Supple, no jugular venous distention. No thyroid enlargement, no tenderness.  LUNGS: Minimal breath sounds bilaterally,  wheezing, no rales,rhonchi or crepitation. No use of accessory muscles of respiration.  CARDIOVASCULAR: S1, S2 normal. No murmurs, rubs, or gallops.  ABDOMEN: Soft, nontender, nondistended. Bowel sounds present. No organomegaly or mass.  EXTREMITIES: No  pedal edema, cyanosis, or clubbing.  NEUROLOGIC: Cranial nerves II through XII are intact. Muscle strength 5/5 in all extremities. Sensation intact. Gait not checked.  PSYCHIATRIC: The patient is alert and oriented x 3.  SKIN: No obvious rash, lesion, or ulcer.    LABORATORY PANEL:   CBC Recent Labs  Lab 09/11/17 1411  WBC 11.3*  HGB 10.7*  HCT 32.5*  PLT 166   ------------------------------------------------------------------------------------------------------------------  Chemistries  Recent Labs  Lab 09/12/17 0339  NA 144  K 5.1  CL 107  CO2 29  GLUCOSE 209*  BUN 37*  CREATININE 1.79*  CALCIUM 8.5*   ------------------------------------------------------------------------------------------------------------------  Cardiac Enzymes Recent Labs  Lab 09/11/17 1411  TROPONINI <0.03   ------------------------------------------------------------------------------------------------------------------  RADIOLOGY:  Dg Chest Portable 1 View  Result Date: 09/11/2017 CLINICAL DATA:  Chest pain.  Shortness of breath. EXAM: PORTABLE CHEST 1 VIEW COMPARISON:  08/27/2017.  04/05/2017.  11/28/2015. FINDINGS: Cardiomegaly with normal pulmonary vascularity. Low lung volumes with mild bibasilar atelectasis/infiltrates. A component of scarring may be present. No pleural effusion or pneumothorax. IMPRESSION: 1. Low lung volumes with mild bibasilar atelectasis/infiltrates. A component of bibasilar scarring may be present. Similar findings noted on prior exam. 2.  Cardiomegaly.  No pulmonary venous congestion. Electronically Signed   By: Marcello Moores  Register   On: 09/11/2017 14:57    EKG:   Orders placed or performed during the hospital encounter of 09/11/17  . ED EKG within 10 minutes  . ED EKG within 10 minutes  . EKG 12-Lead  . EKG 12-Lead    ASSESSMENT AND PLAN:   *Acute on chronic hypoxic respiratory failure-from COPD exacerbation and pneumonia Clinically improving breathing  treatments as needed on  3 L chronically   *Acute on COPD exacerbation due to pneumonia And Rocephin and azithromycin Solu-Medrol and nebulizer treatments  follow-up on sputum culture and sensitivity if sample is collected   *Acute community-acquired pneumonia Rocephin and azithromycin Urine for Legionella antigen and streptococcal antibody are pending  *Chronic kidney disease stage III-stable Seems to be at baseline.  Creatinine at 1.79 and BUN at 37.  Continue close monitoring and avoid nephrotoxins  *Acute hyperkalemia Improved.  Potassium at 5.1 today       All the records are reviewed and case discussed with Care Management/Social Workerr. Management plans discussed with the patient, family and they are in agreement.  CODE STATUS: fc  TOTAL TIME TAKING CARE OF THIS PATIENT:  35  minutes.   POSSIBLE D/C IN 1-2  DAYS, DEPENDING ON CLINICAL CONDITION.  Note: This dictation was prepared with Dragon dictation along with smaller phrase technology. Any transcriptional errors that result from this process are unintentional.   Nicholes Mango M.D on 09/12/2017 at 1:43 PM  Between 7am to 6pm - Pager - [phone] After 6pm go to [email or web address] - password [email or web address] [facility]  Office  [phone]  CC: Primary care physician; Valerie Roys, DO

## 2017-09-13 LAB — LEGIONELLA PNEUMOPHILA SEROGP 1 UR AG: L. pneumophila Serogp 1 Ur Ag: NEGATIVE

## 2017-09-13 LAB — PROCALCITONIN: Procalcitonin: <0.1 ng/mL

## 2017-09-13 LAB — GLUCOSE, CAPILLARY
GLUCOSE-CAPILLARY: 220 mg/dL — AB (ref 70–99)
GLUCOSE-CAPILLARY: 198 mg/dL — AB (ref 70–99)
Glucose-Capillary: 169 mg/dL — ABNORMAL HIGH (ref 70–99)
Glucose-Capillary: 327 mg/dL — ABNORMAL HIGH (ref 70–99)

## 2017-09-13 LAB — HIV ANTIBODY (ROUTINE TESTING W REFLEX): HIV Screen 4th Generation wRfx: NONREACTIVE

## 2017-09-13 LAB — HEMOGLOBIN A1C
Hgb A1c MFr Bld: 6.8 % — ABNORMAL HIGH (ref 4.8–5.6)
Mean Plasma Glucose: 148.46 mg/dL

## 2017-09-13 NOTE — Progress Notes (Signed)
[facility] [place] NAME: Katherine [name]    MR#:  [id number]  DATE OF BIRTH:  06-Aug-1940  SUBJECTIVE:   Still has SOB and wheezing.  REVIEW OF SYSTEMS:  CONSTITUTIONAL: No fever, fatigue or weakness.  EYES: No blurred or double vision.  EARS, NOSE, AND THROAT: No tinnitus or ear pain.  RESPIRATORY:  Cough, SOB, wheezing CARDIOVASCULAR: No chest pain, orthopnea, edema.  GASTROINTESTINAL: No nausea, vomiting, diarrhea or abdominal pain.  GENITOURINARY: No dysuria, hematuria.  ENDOCRINE: No polyuria, nocturia,  HEMATOLOGY: No anemia, easy bruising or bleeding SKIN: No rash or lesion. MUSCULOSKELETAL: No joint pain or arthritis.   NEUROLOGIC: No tingling, numbness, weakness.  PSYCHIATRY: No anxiety or depression.   DRUG ALLERGIES:   Allergies  Allergen Reactions  . Bee Venom Swelling  . Enalapril Maleate Swelling  . Other     Bee stings    VITALS:  Blood pressure (!) 157/104, pulse 75, temperature (!) 97.5 F (36.4 C), temperature source Oral, resp. rate 18, height 5' (1.524 m), weight 122 kg (269 lb), SpO2 96 %.  PHYSICAL EXAMINATION:  GENERAL:  77 y.o.-year-old patient lying in the bed with no acute distress.  EYES: Pupils equal, round, reactive to light and accommodation. No scleral icterus. Extraocular muscles intact.  HEENT: Head atraumatic, normocephalic. Oropharynx and nasopharynx clear.  NECK:  Supple, no jugular venous distention. No thyroid enlargement, no tenderness.  LUNGS:Bilateral wheezing, decreased air entry CARDIOVASCULAR: S1, S2 normal. No murmurs, rubs, or gallops.  ABDOMEN: Soft, nontender, nondistended. Bowel sounds present. No organomegaly or mass.  EXTREMITIES: No pedal edema, cyanosis, or clubbing.  NEUROLOGIC: Cranial nerves II through XII are intact. Muscle strength 5/5 in all extremities. Sensation intact. Gait not checked.  PSYCHIATRIC: The patient is alert and oriented x 3.  SKIN: No obvious  rash, lesion, or ulcer.    LABORATORY PANEL:   CBC Recent Labs  Lab 09/11/17 1411  WBC 11.3*  HGB 10.7*  HCT 32.5*  PLT 166   ------------------------------------------------------------------------------------------------------------------  Chemistries  Recent Labs  Lab 09/12/17 0339  NA 144  K 5.1  CL 107  CO2 29  GLUCOSE 209*  BUN 37*  CREATININE 1.79*  CALCIUM 8.5*   ------------------------------------------------------------------------------------------------------------------  Cardiac Enzymes Recent Labs  Lab 09/11/17 1411  TROPONINI <0.03   ------------------------------------------------------------------------------------------------------------------  RADIOLOGY:  Dg Chest Portable 1 View  Result Date: 09/11/2017 CLINICAL DATA:  Chest pain.  Shortness of breath. EXAM: PORTABLE CHEST 1 VIEW COMPARISON:  08/27/2017.  04/05/2017.  11/28/2015. FINDINGS: Cardiomegaly with normal pulmonary vascularity. Low lung volumes with mild bibasilar atelectasis/infiltrates. A component of scarring may be present. No pleural effusion or pneumothorax. IMPRESSION: 1. Low lung volumes with mild bibasilar atelectasis/infiltrates. A component of bibasilar scarring may be present. Similar findings noted on prior exam. 2.  Cardiomegaly.  No pulmonary venous congestion. Electronically Signed   By: Marcello Moores  Register   On: 09/11/2017 14:57    EKG:   Orders placed or performed during the hospital encounter of 09/11/17  . ED EKG within 10 minutes  . ED EKG within 10 minutes  . EKG 12-Lead  . EKG 12-Lead    ASSESSMENT AND PLAN:   *Acute on chronic hypoxic respiratory failure-from COPD exacerbation and pneumonia Clinically improving breathing treatments as needed on 3 L chronically  *Acute on COPD exacerbation with babasilar pneumonia And Rocephin and azithromycin Solu-Medrol and nebulizer treatments  follow-up on sputum culture and sensitivity. Check Procalcitonin  *Acute  community-acquired pneumonia Rocephin and  azithromycin streptococcal antigen Negative  *Chronic kidney disease stage III-stable  *Acute hyperkalemia Mild. Improving   All the records are reviewed and case discussed with Care Management/Social Workerr. Management plans discussed with the patient, family and they are in agreement.  CODE STATUS:  FULL CODE  TOTAL TIME TAKING CARE OF THIS PATIENT:  35  minutes.   POSSIBLE D/C IN 1-2  DAYS, DEPENDING ON CLINICAL CONDITION.  Note: This dictation was prepared with Dragon dictation along with smaller phrase technology. Any transcriptional errors that result from this process are unintentional.   Neita Carp M.D on 09/13/2017 at 11:50 AM  Between 7am to 6pm - Pager - [phone] After 6pm go to [email or web address] - password [email or web address] [facility]  Office  [phone]  CC: Primary care physician; Valerie Roys, DO

## 2017-09-13 NOTE — Progress Notes (Addendum)
Rounding completed--patient sleeping on right side, no distress noted, bed in low position, call bell within reach, will continue to monitor.   @ 0418 Rounding completed. Patient sleeping supine, no distress noted. Bed in low position, call bell within reach, will continue to monitor.

## 2017-09-14 LAB — CULTURE, RESPIRATORY W GRAM STAIN

## 2017-09-14 LAB — BASIC METABOLIC PANEL
Anion gap: 6 (ref 5–15)
BUN: 45 mg/dL — AB (ref 8–23)
CALCIUM: 8.4 mg/dL — AB (ref 8.9–10.3)
CHLORIDE: 104 mmol/L (ref 98–111)
CO2: 31 mmol/L (ref 22–32)
Creatinine, Ser: 1.74 mg/dL — ABNORMAL HIGH (ref 0.44–1.00)
GFR, EST AFRICAN AMERICAN: 32 mL/min — AB (ref >60)
GFR, EST NON AFRICAN AMERICAN: 27 mL/min — AB (ref >60)
Glucose, Bld: 192 mg/dL — ABNORMAL HIGH (ref 70–99)
Potassium: 4.6 mmol/L (ref 3.5–5.1)
Sodium: 141 mmol/L (ref 135–145)

## 2017-09-14 LAB — GLUCOSE, CAPILLARY
GLUCOSE-CAPILLARY: 417 mg/dL — AB (ref 70–99)
Glucose-Capillary: 164 mg/dL — ABNORMAL HIGH (ref 70–99)
Glucose-Capillary: 191 mg/dL — ABNORMAL HIGH (ref 70–99)

## 2017-09-14 LAB — CULTURE, RESPIRATORY: CULTURE: NORMAL

## 2017-09-14 MED ORDER — PREDNISONE 50 MG PO TABS
50.0000 mg | ORAL_TABLET | Freq: Every day | ORAL | Status: DC
  Administered 2017-09-14 – 2017-09-15 (×2): 50 mg via ORAL
  Filled 2017-09-14 (×2): qty 1

## 2017-09-14 MED ORDER — INSULIN ASPART 100 UNIT/ML ~~LOC~~ SOLN
15.0000 [IU] | Freq: Once | SUBCUTANEOUS | Status: AC
  Administered 2017-09-14: 15 [IU] via SUBCUTANEOUS
  Filled 2017-09-14: qty 1

## 2017-09-14 NOTE — Progress Notes (Signed)
[facility] at [place] NAME: Katherine [name]    MR#:  [id number]  DATE OF BIRTH:  [birth year]  SUBJECTIVE:   Still has SOB and wheezing. Chest when she coughs  REVIEW OF SYSTEMS:  CONSTITUTIONAL: No fever, fatigue or weakness.  EYES: No blurred or double vision.  EARS, NOSE, AND THROAT: No tinnitus or ear pain.  RESPIRATORY:  Cough, SOB, wheezing CARDIOVASCULAR: No chest pain, orthopnea, edema.  GASTROINTESTINAL: No nausea, vomiting, diarrhea or abdominal pain.  GENITOURINARY: No dysuria, hematuria.  ENDOCRINE: No polyuria, nocturia,  HEMATOLOGY: No anemia, easy bruising or bleeding SKIN: No rash or lesion. MUSCULOSKELETAL: No joint pain or arthritis.   NEUROLOGIC: No tingling, numbness, weakness.  PSYCHIATRY: No anxiety or depression.   DRUG ALLERGIES:   Allergies  Allergen Reactions  . Bee Venom Swelling  . Enalapril Maleate Swelling  . Other     Bee stings    VITALS:  Blood pressure (!) 160/83, pulse (!) 59, temperature 98 F (36.7 C), temperature source Oral, resp. rate 18, height 5' (1.524 m), weight 122 kg (269 lb), SpO2 97 %.  PHYSICAL EXAMINATION:  GENERAL:  77 y.o.-year-old patient lying in the bed with no acute distress.  EYES: Pupils equal, round, reactive to light and accommodation. No scleral icterus. Extraocular muscles intact.  HEENT: Head atraumatic, normocephalic. Oropharynx and nasopharynx clear.  NECK:  Supple, no jugular venous distention. No thyroid enlargement, no tenderness.  LUNGS:Bilateral wheezing, decreased air entry CARDIOVASCULAR: S1, S2 normal. No murmurs, rubs, or gallops.  ABDOMEN: Soft, nontender, nondistended. Bowel sounds present. No organomegaly or mass.  EXTREMITIES: No pedal edema, cyanosis, or clubbing.  NEUROLOGIC: Cranial nerves II through XII are intact. Muscle strength 5/5 in all extremities. Sensation intact. Gait not checked.  PSYCHIATRIC: The patient is alert and oriented x 3.   SKIN: No obvious rash, lesion, or ulcer.    LABORATORY PANEL:   CBC Recent Labs  Lab 09/11/17 1411  WBC 11.3*  HGB 10.7*  HCT 32.5*  PLT 166   ------------------------------------------------------------------------------------------------------------------  Chemistries  Recent Labs  Lab 09/14/17 0359  NA 141  K 4.6  CL 104  CO2 31  GLUCOSE 192*  BUN 45*  CREATININE 1.74*  CALCIUM 8.4*   ------------------------------------------------------------------------------------------------------------------  Cardiac Enzymes Recent Labs  Lab 09/11/17 1411  TROPONINI <0.03   ------------------------------------------------------------------------------------------------------------------  RADIOLOGY:  No results found.  EKG:   Orders placed or performed during the hospital encounter of 09/11/17  . ED EKG within 10 minutes  . ED EKG within 10 minutes  . EKG 12-Lead  . EKG 12-Lead    ASSESSMENT AND PLAN:   *Acute on chronic hypoxic respiratory failure-from COPD exacerbation and pneumonia Clinically improving breathing treatments as needed on 3 L chronically  *Acute on COPD exacerbation with atelectasis Was Rocephin and azithromycin Solu-Medrol and nebulizer treatments  follow-up on sputum culture and sensitivity. Procalcitonin was normal and Abx stopped , PNA initially suspected.  NOT PNEUMONIA  *Chronic kidney disease stage III-stable  *Acute hyperkalemia Mild. Resolved  * Uncontrolled DM HbA1c 6.8 But blood sugars are 417 today STAT dose of Novolog x 1 15 units. High risk for DKA if untreated   All the records are reviewed and case discussed with Care Management/Social Workerr. Management plans discussed with the patient, family and they are in agreement.  CODE STATUS:  FULL CODE  TOTAL CC TIME TAKING CARE OF THIS PATIENT:  35  minutes.   POSSIBLE D/C IN 1-2  DAYS, DEPENDING  ON CLINICAL CONDITION.  Note: This dictation was prepared with  Dragon dictation along with smaller phrase technology. Any transcriptional errors that result from this process are unintentional.   Neita Carp M.D on 09/14/2017 at 11:57 AM  Between 7am to 6pm - Pager - [phone] After 6pm go to [email or web address] - password [email or web address] [facility]  Office  [phone]  CC: Primary care physician; Valerie Roys, DO

## 2017-09-14 NOTE — Progress Notes (Signed)
Inpatient Diabetes Program Recommendations  AACE/ADA: New Consensus Statement on Inpatient Glycemic Control (2019)  Target Ranges:  Prepandial:   less than 140 mg/dL      Peak postprandial:   less than 180 mg/dL (1-2 hours)      Critically ill patients:  140 - 180 mg/dL  Results for CAMAYA, GANNETT (MRN [id number]) as of 09/14/2017 08:30  Ref. Range 09/13/2017 07:26 09/13/2017 11:33 09/13/2017 16:16 09/13/2017 21:26  Glucose-Capillary Latest Ref Range: 70 - 99 mg/dL 169 (H) 327 (H) 220 (H) 198 (H)   Results for ALAZAE, CRYMES (MRN [id number]) as of 09/14/2017 08:30  Ref. Range 09/12/2017 03:39  Hemoglobin A1C Latest Ref Range: 4.8 - 5.6 % 6.8 (H)   Review of Glycemic Control  Diabetes history: No Outpatient Diabetes medications: NA Current orders for Inpatient glycemic control: Novolog 0-9 units TID with meals; Solumedrol 40 mg Q8H  Inpatient Diabetes Program Recommendations:  Insulin-Correction: Please consider ordering Novolog 0-5 units QHS for bedtime correction. Insulin-Meal Coverage: If steroids are continued, please consider ordering Novolog 4 units TID with meals if patient eats at least 50% of meals.  HgbA1C: Noted A1C 6.8% on 09/12/17. Patient does not have a history of DM and per ADA, if A1C 6.5% or greater then criteria met to dx with DM. However, noted patient was an inpatient from 08/27/17-08/29/17 and was received Solumedrol for angioedema. Patient is currently ordered Solumedrol 40 mg Q8H.  Recent steroids are contributing to elevated A1C. Would not recommend diagnosing with DM at this time. Recommend patient follow up with PCP regarding glycemic control and have A1C repeated after 3 months of any steroid use.  Thanks, Barnie Alderman, RN, MSN, CDE Diabetes Coordinator Inpatient Diabetes Program [phone] (Team Pager from 8am to 5pm)

## 2017-09-14 NOTE — Care Management Important Message (Signed)
Important Message  Patient Details  Name: Katherine [name] MRN: [id number] Date of Birth: 30-Dec-1940   Medicare Important Message Given:  Yes    [name] 09/14/2017, 11:02 AM

## 2017-09-14 NOTE — Plan of Care (Signed)
Dr. Text to inform of BS 417.

## 2017-09-15 LAB — GLUCOSE, CAPILLARY: Glucose-Capillary: 202 mg/dL — ABNORMAL HIGH (ref 70–99)

## 2017-09-15 MED ORDER — AZITHROMYCIN 500 MG PO TABS: 500.0000 mg | ORAL_TABLET | ORAL | 0 refills | Status: AC

## 2017-09-15 MED ORDER — PREDNISONE 50 MG PO TABS: ORAL_TABLET | ORAL | 0 refills | Status: AC

## 2017-09-15 NOTE — Care Management Note (Signed)
Case Management Note  Patient Details  Name: Katherine [name] MRN: [id number] Date of Birth: 02/13/1941  Subjective/Objective:     Patient to be discharged per MD order. Patient was previously open to [facility] for PT,RN, OT and aide services. Orders in place for resumption. Referral placed with Katherine [name] from [facility] who will resume services. Patient has no DME needs. Family will provide transport home. Ines Bloomer RN BSN RNCM [phone]                 Action/Plan:   Expected Discharge Date:  09/15/17               Expected Discharge Plan:  [place]  In-House Referral:     Discharge planning Services  CM Consult  Post Acute Care Choice:  Home Health, Resumption of Svcs/PTA Provider Choice offered to:  Patient  DME Arranged:    DME Agency:     HH Arranged:  RN, PT, OT, Nurse's Aide [place] Agency:  [facility]  Status of Service:  Completed, signed off  If discussed at [place] of Stay Meetings, dates discussed:    Additional Comments:  Katherine Tutterow A Ayasha Ellingsen, RN 09/15/2017, 10:30 AM

## 2017-09-15 NOTE — Progress Notes (Signed)
Patient is being discharged to home with [place] instructions given and patient acknowledged understanding. IV's removed. Belongings packed. Patient will call husband to pick her up.

## 2017-09-15 NOTE — Plan of Care (Signed)
  Problem: Activity: Goal: Ability to tolerate increased activity will improve Outcome: Progressing   Problem: Respiratory: Goal: Ability to maintain adequate ventilation will improve Outcome: Progressing   Problem: Clinical Measurements: Goal: Respiratory complications will improve Outcome: Progressing   Problem: Activity: Goal: Risk for activity intolerance will decrease Outcome: Progressing   Problem: Coping: Goal: Level of anxiety will decrease Outcome: Progressing

## 2017-09-15 NOTE — Discharge Summary (Signed)
[facility] at [place] NAME: Katherine [name]    MR#:  [id number]  DATE OF BIRTH:  01-Nov-1940  DATE OF ADMISSION:  09/11/2017 ADMITTING PHYSICIAN: Gorden Harms, MD  DATE OF DISCHARGE: 09/15/2017  PRIMARY CARE PHYSICIAN: Valerie Roys, DO    ADMISSION DIAGNOSIS:  COPD exacerbation ([place]) [J44.1]  DISCHARGE DIAGNOSIS:  Active Problems:   CAP (community acquired pneumonia)   SECONDARY DIAGNOSIS:   Past Medical History:  Diagnosis Date  . Angioedema   . Anxiety   . Anxiety and depression   . Chronic constipation   . Chronic kidney disease    stage 3  . COPD (chronic obstructive pulmonary disease) ([place])   . Gallstones   . GERD (gastroesophageal reflux disease)   . Hypertension   . Left ventricular hypertrophy   . Osteoarthritis   . Prurigo nodularis   . Tobacco abuse   . Vitamin D deficiency disease     HOSPITAL COURSE:  77 year old female with a history of chronic respiratory failure on 3 L oxygen, obesity and chronic kidney disease stage III who presented to the ER due to shortness of breath.  1.  Acute on chronic hypoxic respiratory failure from COPD exacerbation with acute bronchitis: Patient is on baseline oxygen  2.  Acute exacerbation of COPD with acute bronchitis: Patient will be on prednisone for 3 more days and azithromycin.  She will continue her inhalers and nebulizer treatment.  3.  Chronic kidney disease stage III: Creatinine has been stable  4.  Diabetes: Patient will continue with ADA diet and outpatient regimen A1c is 6.8 5.  Essential hypertension: Continue diltiazem  6  Anxiety depression: Patient will continue outpatient medications including fluoxetine  7.  Hyperlipidemia: Continue statin DISCHARGE CONDITIONS AND DIET:   Stable for discharge on heart healthy diet  CONSULTS OBTAINED:    DRUG ALLERGIES:   Allergies  Allergen Reactions  . Bee Venom Swelling  . Enalapril Maleate Swelling  .  Other     Bee stings    DISCHARGE MEDICATIONS:   Allergies as of 09/15/2017      Reactions   Bee Venom Swelling   Enalapril Maleate Swelling   Other    Bee stings      Medication List    TAKE these medications   acetaminophen 650 MG CR tablet Commonly known as:  TYLENOL Take 650-1,300 mg by mouth every 8 (eight) hours as needed for pain.   albuterol 108 (90 Base) MCG/ACT inhaler Commonly known as:  PROVENTIL HFA Inhale 2 puffs into the lungs every 4 (four) hours as needed for wheezing or shortness of breath.   albuterol 0.63 MG/3ML nebulizer solution Commonly known as:  ACCUNEB Take 3 mLs (0.63 mg total) by nebulization 3 (three) times daily as needed for wheezing. DX:J44.9   atorvastatin 80 MG tablet Commonly known as:  LIPITOR Take 80 mg by mouth daily.   azithromycin 500 MG tablet Commonly known as:  ZITHROMAX Take 1 tablet (500 mg total) by mouth daily for 3 days.   cetirizine 10 MG tablet Commonly known as:  ZYRTEC TAKE 1/2 TABLET(5 MG) BY MOUTH AT BEDTIME   cholecalciferol 1000 units tablet Commonly known as:  VITAMIN D Take 1,000 Units by mouth daily.   clopidogrel 75 MG tablet Commonly known as:  PLAVIX Take 1 tablet (75 mg total) by mouth daily.   diltiazem 120 MG 24 hr capsule Commonly known as:  CARDIZEM CD Take 120 mg by mouth  daily.   FLUoxetine 10 MG capsule Commonly known as:  PROZAC TAKE 1 CAPSULE(10 MG) BY MOUTH DAILY   Fluticasone-Umeclidin-Vilant 100-62.5-25 MCG/INH Aepb Commonly known as:  TRELEGY ELLIPTA Inhale 1 puff into the lungs daily.   gabapentin 300 MG capsule Commonly known as:  NEURONTIN Take 1 capsule (300 mg total) by mouth at bedtime.   hydrALAZINE 25 MG tablet Commonly known as:  APRESOLINE Take 1 tablet (25 mg total) by mouth 3 (three) times daily.   ipratropium 0.03 % nasal spray Commonly known as:  ATROVENT Place 2 sprays into both nostrils every 12 (twelve) hours.   ipratropium-albuterol 0.5-2.5 (3) MG/3ML  Soln Commonly known as:  DUONEB USE 1 VIAL VIA NEBULIZER EVERY 6 HOURS AS NEEDED   methocarbamol 500 MG tablet Commonly known as:  ROBAXIN Take 1 tablet (500MG ) by mouth every 8 hours as needed for muscle spasms   pantoprazole 40 MG tablet Commonly known as:  PROTONIX Take 1 tablet (40 mg total) by mouth daily.   predniSONE 50 MG tablet Commonly known as:  DELTASONE Take 1 tablet daily for 3 days Start taking on:  09/16/2017   promethazine 12.5 MG tablet Commonly known as:  PHENERGAN Take 12.5 mg by mouth every 6 (six) hours as needed for nausea or vomiting.   senna 8.6 MG tablet Commonly known as:  SENOKOT Take 2 tablets by mouth at bedtime as needed for constipation.   tiotropium 18 MCG inhalation capsule Commonly known as:  SPIRIVA Place 18 mcg into inhaler and inhale daily.         Today   CHIEF COMPLAINT:   Shortness of breath and wheezing have improved   VITAL SIGNS:  Blood pressure (!) 163/90, pulse 65, temperature 98.2 F (36.8 C), temperature source Oral, resp. rate 20, height 5' (1.524 m), weight 269 lb (122 kg), SpO2 98 %.   REVIEW OF SYSTEMS:  Review of Systems  Constitutional: Negative.  Negative for chills, fever and malaise/fatigue.  HENT: Negative.  Negative for ear discharge, ear pain, hearing loss, nosebleeds and sore throat.   Eyes: Negative.  Negative for blurred vision and pain.  Respiratory: Negative.  Negative for cough, hemoptysis, shortness of breath and wheezing.   Cardiovascular: Negative.  Negative for chest pain, palpitations and leg swelling.  Gastrointestinal: Negative.  Negative for abdominal pain, blood in stool, diarrhea, nausea and vomiting.  Genitourinary: Negative.  Negative for dysuria.  Musculoskeletal: Negative.  Negative for back pain.  Skin: Negative.   Neurological: Negative for dizziness, tremors, speech change, focal weakness, seizures and headaches.  Endo/Heme/Allergies: Negative.  Does not bruise/bleed easily.   Psychiatric/Behavioral: Negative.  Negative for depression, hallucinations and suicidal ideas.     PHYSICAL EXAMINATION:  GENERAL:  77 y.o.-year-old patient lying in the bed with no acute distress.  Obese NECK:  Supple, no jugular venous distention. No thyroid enlargement, no tenderness.  LUNGS: Normal breath sounds bilaterally, no wheezing, rales,rhonchi  No use of accessory muscles of respiration.  CARDIOVASCULAR: S1, S2 normal. No murmurs, rubs, or gallops.  ABDOMEN: Soft, non-tender, non-distended. Bowel sounds present. No organomegaly or mass.  EXTREMITIES: No pedal edema, cyanosis, or clubbing.  PSYCHIATRIC: The patient is alert and oriented x 3.  SKIN: No obvious rash, lesion, or ulcer.   DATA REVIEW:   CBC Recent Labs  Lab 09/11/17 1411  WBC 11.3*  HGB 10.7*  HCT 32.5*  PLT 166    Chemistries  Recent Labs  Lab 09/14/17 0359  NA 141  K 4.6  CL 104  CO2 31  GLUCOSE 192*  BUN 45*  CREATININE 1.74*  CALCIUM 8.4*    Cardiac Enzymes Recent Labs  Lab 09/11/17 1411  TROPONINI <0.03    Microbiology Results  @MICRORSLT48 @  RADIOLOGY:  No results found.    Allergies as of 09/15/2017      Reactions   Bee Venom Swelling   Enalapril Maleate Swelling   Other    Bee stings      Medication List    TAKE these medications   acetaminophen 650 MG CR tablet Commonly known as:  TYLENOL Take 650-1,300 mg by mouth every 8 (eight) hours as needed for pain.   albuterol 108 (90 Base) MCG/ACT inhaler Commonly known as:  PROVENTIL HFA Inhale 2 puffs into the lungs every 4 (four) hours as needed for wheezing or shortness of breath.   albuterol 0.63 MG/3ML nebulizer solution Commonly known as:  ACCUNEB Take 3 mLs (0.63 mg total) by nebulization 3 (three) times daily as needed for wheezing. DX:J44.9   atorvastatin 80 MG tablet Commonly known as:  LIPITOR Take 80 mg by mouth daily.   azithromycin 500 MG tablet Commonly known as:  ZITHROMAX Take 1 tablet (500  mg total) by mouth daily for 3 days.   cetirizine 10 MG tablet Commonly known as:  ZYRTEC TAKE 1/2 TABLET(5 MG) BY MOUTH AT BEDTIME   cholecalciferol 1000 units tablet Commonly known as:  VITAMIN D Take 1,000 Units by mouth daily.   clopidogrel 75 MG tablet Commonly known as:  PLAVIX Take 1 tablet (75 mg total) by mouth daily.   diltiazem 120 MG 24 hr capsule Commonly known as:  CARDIZEM CD Take 120 mg by mouth daily.   FLUoxetine 10 MG capsule Commonly known as:  PROZAC TAKE 1 CAPSULE(10 MG) BY MOUTH DAILY   Fluticasone-Umeclidin-Vilant 100-62.5-25 MCG/INH Aepb Commonly known as:  TRELEGY ELLIPTA Inhale 1 puff into the lungs daily.   gabapentin 300 MG capsule Commonly known as:  NEURONTIN Take 1 capsule (300 mg total) by mouth at bedtime.   hydrALAZINE 25 MG tablet Commonly known as:  APRESOLINE Take 1 tablet (25 mg total) by mouth 3 (three) times daily.   ipratropium 0.03 % nasal spray Commonly known as:  ATROVENT Place 2 sprays into both nostrils every 12 (twelve) hours.   ipratropium-albuterol 0.5-2.5 (3) MG/3ML Soln Commonly known as:  DUONEB USE 1 VIAL VIA NEBULIZER EVERY 6 HOURS AS NEEDED   methocarbamol 500 MG tablet Commonly known as:  ROBAXIN Take 1 tablet (500MG ) by mouth every 8 hours as needed for muscle spasms   pantoprazole 40 MG tablet Commonly known as:  PROTONIX Take 1 tablet (40 mg total) by mouth daily.   predniSONE 50 MG tablet Commonly known as:  DELTASONE Take 1 tablet daily for 3 days Start taking on:  09/16/2017   promethazine 12.5 MG tablet Commonly known as:  PHENERGAN Take 12.5 mg by mouth every 6 (six) hours as needed for nausea or vomiting.   senna 8.6 MG tablet Commonly known as:  SENOKOT Take 2 tablets by mouth at bedtime as needed for constipation.   tiotropium 18 MCG inhalation capsule Commonly known as:  SPIRIVA Place 18 mcg into inhaler and inhale daily.          Management plans discussed with the patient and  she is in agreement. Stable for discharge   Patient should follow up with pcp  CODE STATUS:     Code Status Orders  (From admission, onward)  Start     Ordered   09/11/17 2021  Full code  Continuous     09/11/17 2020    Code Status History    Date Active Date Inactive Code Status Order ID Comments User Context   08/27/2017 1543 08/29/2017 2159 Full Code [id number]  Hillary Bow, MD ED   06/28/2017 1414 06/30/2017 1748 Full Code [id number]  Gorden Harms, MD Inpatient   06/12/2017 0858 06/16/2017 1659 DNR [id number]  Demetrios Loll, MD Inpatient   05/26/2017 1732 05/30/2017 1446 DNR [id number]  Hillary Bow, MD ED   05/17/2017 1228 05/21/2017 1511 DNR [id number]  Asencion Gowda, NP Inpatient   04/21/2017 0117 04/22/2017 1750 DNR [id number]  Lance Coon, MD Inpatient   04/05/2017 1411 04/08/2017 2101 Full Code [id number]  Saundra Shelling, MD Inpatient   02/08/2016 1414 02/11/2016 1729 Full Code [id number]  Hessie Knows, MD Inpatient   11/27/2015 1645 11/29/2015 1628 Full Code [id number]  Idelle Crouch, MD Inpatient   02/10/2015 2122 02/12/2015 1809 Full Code [id number]  Lytle Butte, MD ED   09/16/2014 1220 09/19/2014 1736 Full Code [id number]  Aldean Jewett, MD Inpatient      TOTAL TIME TAKING CARE OF THIS PATIENT: 37 minutes.    Note: This dictation was prepared with Dragon dictation along with smaller phrase technology. Any transcriptional errors that result from this process are unintentional.  Savina Olshefski M.D on 09/15/2017 at 9:15 AM  Between 7am to 6pm - Pager - [phone] After 6pm go to [email or web address] - password [email or web address] [facility]  Office  [phone]  CC: Primary care physician; Valerie Roys, DO

## 2017-09-16 LAB — CULTURE, BLOOD (ROUTINE X 2)
CULTURE: NO GROWTH
CULTURE: NO GROWTH
Special Requests: ADEQUATE
Special Requests: ADEQUATE

## 2017-09-17 ENCOUNTER — Telehealth: Payer: Self-pay

## 2017-09-17 DIAGNOSIS — I2510 Atherosclerotic heart disease of native coronary artery without angina pectoris: Secondary | ICD-10-CM | POA: Diagnosis not present

## 2017-09-17 DIAGNOSIS — I270 Primary pulmonary hypertension: Secondary | ICD-10-CM | POA: Diagnosis not present

## 2017-09-17 DIAGNOSIS — I69320 Aphasia following cerebral infarction: Secondary | ICD-10-CM | POA: Diagnosis not present

## 2017-09-17 DIAGNOSIS — J441 Chronic obstructive pulmonary disease with (acute) exacerbation: Secondary | ICD-10-CM | POA: Diagnosis not present

## 2017-09-17 DIAGNOSIS — I129 Hypertensive chronic kidney disease with stage 1 through stage 4 chronic kidney disease, or unspecified chronic kidney disease: Secondary | ICD-10-CM | POA: Diagnosis not present

## 2017-09-17 DIAGNOSIS — N183 Chronic kidney disease, stage 3 (moderate): Secondary | ICD-10-CM | POA: Diagnosis not present

## 2017-09-17 DIAGNOSIS — J9612 Chronic respiratory failure with hypercapnia: Secondary | ICD-10-CM | POA: Diagnosis not present

## 2017-09-17 DIAGNOSIS — E875 Hyperkalemia: Secondary | ICD-10-CM | POA: Diagnosis not present

## 2017-09-17 DIAGNOSIS — J9611 Chronic respiratory failure with hypoxia: Secondary | ICD-10-CM | POA: Diagnosis not present

## 2017-09-17 NOTE — Telephone Encounter (Signed)
I have made the 1st attempt to contact the patient or family member in charge, in order to follow up from recently being discharged from the hospital. I left a message on voicemail but I will make another attempt at a different time.   Direct call back - [phone] 

## 2017-09-17 NOTE — Telephone Encounter (Signed)
I have made the 2nd attempt to contact the patient or family member in charge, in order to follow up from recently being discharged from the hospital.  

## 2017-09-19 ENCOUNTER — Other Ambulatory Visit: Payer: Self-pay

## 2017-09-19 ENCOUNTER — Inpatient Hospital Stay
Admission: EM | Admit: 2017-09-19 | Discharge: 2017-09-22 | DRG: 190 | Disposition: A | Discharge status: Home Health | Payer: Medicare HMO | Attending: Internal Medicine | Admitting: Internal Medicine

## 2017-09-19 ENCOUNTER — Encounter: Payer: Self-pay | Admitting: Emergency Medicine

## 2017-09-19 ENCOUNTER — Emergency Department: Payer: Medicare HMO

## 2017-09-19 DIAGNOSIS — Z87891 Personal history of nicotine dependence: Secondary | ICD-10-CM

## 2017-09-19 DIAGNOSIS — Z96651 Presence of right artificial knee joint: Secondary | ICD-10-CM | POA: Diagnosis present

## 2017-09-19 DIAGNOSIS — N183 Chronic kidney disease, stage 3 (moderate): Secondary | ICD-10-CM | POA: Diagnosis present

## 2017-09-19 DIAGNOSIS — I509 Heart failure, unspecified: Secondary | ICD-10-CM | POA: Diagnosis not present

## 2017-09-19 DIAGNOSIS — G4733 Obstructive sleep apnea (adult) (pediatric): Secondary | ICD-10-CM | POA: Diagnosis present

## 2017-09-19 DIAGNOSIS — F329 Major depressive disorder, single episode, unspecified: Secondary | ICD-10-CM | POA: Diagnosis present

## 2017-09-19 DIAGNOSIS — N179 Acute kidney failure, unspecified: Secondary | ICD-10-CM

## 2017-09-19 DIAGNOSIS — J449 Chronic obstructive pulmonary disease, unspecified: Secondary | ICD-10-CM | POA: Diagnosis present

## 2017-09-19 DIAGNOSIS — Z6841 Body Mass Index (BMI) 40.0 and over, adult: Secondary | ICD-10-CM

## 2017-09-19 DIAGNOSIS — K219 Gastro-esophageal reflux disease without esophagitis: Secondary | ICD-10-CM | POA: Diagnosis present

## 2017-09-19 DIAGNOSIS — F419 Anxiety disorder, unspecified: Secondary | ICD-10-CM | POA: Diagnosis present

## 2017-09-19 DIAGNOSIS — I129 Hypertensive chronic kidney disease with stage 1 through stage 4 chronic kidney disease, or unspecified chronic kidney disease: Secondary | ICD-10-CM | POA: Diagnosis present

## 2017-09-19 DIAGNOSIS — I2510 Atherosclerotic heart disease of native coronary artery without angina pectoris: Secondary | ICD-10-CM | POA: Diagnosis present

## 2017-09-19 DIAGNOSIS — Z79899 Other long term (current) drug therapy: Secondary | ICD-10-CM | POA: Diagnosis not present

## 2017-09-19 DIAGNOSIS — J9621 Acute and chronic respiratory failure with hypoxia: Secondary | ICD-10-CM | POA: Diagnosis present

## 2017-09-19 DIAGNOSIS — R42 Dizziness and giddiness: Secondary | ICD-10-CM | POA: Diagnosis not present

## 2017-09-19 DIAGNOSIS — K5909 Other constipation: Secondary | ICD-10-CM | POA: Diagnosis present

## 2017-09-19 DIAGNOSIS — N189 Chronic kidney disease, unspecified: Secondary | ICD-10-CM

## 2017-09-19 DIAGNOSIS — E875 Hyperkalemia: Secondary | ICD-10-CM | POA: Diagnosis present

## 2017-09-19 DIAGNOSIS — Z7902 Long term (current) use of antithrombotics/antiplatelets: Secondary | ICD-10-CM | POA: Diagnosis not present

## 2017-09-19 DIAGNOSIS — Z9049 Acquired absence of other specified parts of digestive tract: Secondary | ICD-10-CM

## 2017-09-19 DIAGNOSIS — E559 Vitamin D deficiency, unspecified: Secondary | ICD-10-CM | POA: Diagnosis present

## 2017-09-19 DIAGNOSIS — Z9981 Dependence on supplemental oxygen: Secondary | ICD-10-CM | POA: Diagnosis not present

## 2017-09-19 DIAGNOSIS — J441 Chronic obstructive pulmonary disease with (acute) exacerbation: Secondary | ICD-10-CM | POA: Diagnosis present

## 2017-09-19 DIAGNOSIS — Z91030 Bee allergy status: Secondary | ICD-10-CM

## 2017-09-19 DIAGNOSIS — Z8249 Family history of ischemic heart disease and other diseases of the circulatory system: Secondary | ICD-10-CM | POA: Diagnosis not present

## 2017-09-19 DIAGNOSIS — I10 Essential (primary) hypertension: Secondary | ICD-10-CM | POA: Diagnosis not present

## 2017-09-19 DIAGNOSIS — E669 Obesity, unspecified: Secondary | ICD-10-CM | POA: Diagnosis not present

## 2017-09-19 DIAGNOSIS — I130 Hypertensive heart and chronic kidney disease with heart failure and stage 1 through stage 4 chronic kidney disease, or unspecified chronic kidney disease: Secondary | ICD-10-CM | POA: Diagnosis not present

## 2017-09-19 LAB — CBC WITH DIFFERENTIAL/PLATELET
BASOS PCT: 1 %
Basophils Absolute: 0.1 10*3/uL (ref 0–0.1)
Eosinophils Absolute: 0.2 10*3/uL (ref 0–0.7)
Eosinophils Relative: 2 %
HEMATOCRIT: 33.8 % — AB (ref 35.0–47.0)
HEMOGLOBIN: 11.1 g/dL — AB (ref 12.0–16.0)
LYMPHS PCT: 10 %
Lymphs Abs: 0.9 10*3/uL — ABNORMAL LOW (ref 1.0–3.6)
MCH: 28.5 pg (ref 26.0–34.0)
MCHC: 32.7 g/dL (ref 32.0–36.0)
MCV: 87.1 fL (ref 80.0–100.0)
Monocytes Absolute: 0.5 10*3/uL (ref 0.2–0.9)
Monocytes Relative: 5 %
NEUTROS ABS: 7.8 10*3/uL — AB (ref 1.4–6.5)
NEUTROS PCT: 82 %
Platelets: 189 10*3/uL (ref 150–440)
RBC: 3.89 MIL/uL (ref 3.80–5.20)
RDW: 18.5 % — ABNORMAL HIGH (ref 11.5–14.5)
WBC: 9.4 10*3/uL (ref 3.6–11.0)

## 2017-09-19 LAB — BASIC METABOLIC PANEL
ANION GAP: 8 (ref 5–15)
BUN: 30 mg/dL — ABNORMAL HIGH (ref 8–23)
CHLORIDE: 104 mmol/L (ref 98–111)
CO2: 30 mmol/L (ref 22–32)
CREATININE: 1.97 mg/dL — AB (ref 0.44–1.00)
Calcium: 8.7 mg/dL — ABNORMAL LOW (ref 8.9–10.3)
GFR calc non Af Amer: 24 mL/min — ABNORMAL LOW (ref >60)
GFR, EST AFRICAN AMERICAN: 27 mL/min — AB (ref >60)
Glucose, Bld: 204 mg/dL — ABNORMAL HIGH (ref 70–99)
Potassium: 5 mmol/L (ref 3.5–5.1)
Sodium: 142 mmol/L (ref 135–145)

## 2017-09-19 LAB — BLOOD GAS, ARTERIAL
Acid-Base Excess: 5.2 mmol/L — ABNORMAL HIGH (ref 0.0–2.0)
Bicarbonate: 31 mmol/L — ABNORMAL HIGH (ref 20.0–28.0)
Delivery systems: POSITIVE
Expiratory PAP: 6
FIO2: 0.35
Inspiratory PAP: 12
O2 Saturation: 91.4 %
PATIENT TEMPERATURE: 37
PCO2 ART: 50 mmHg — AB (ref 32.0–48.0)
PH ART: 7.4 (ref 7.350–7.450)
PO2 ART: 62 mmHg — AB (ref 83.0–108.0)
RATE: 8 resp/min

## 2017-09-19 LAB — TROPONIN I

## 2017-09-19 LAB — BRAIN NATRIURETIC PEPTIDE: B Natriuretic Peptide: 201 pg/mL — ABNORMAL HIGH (ref 0.0–100.0)

## 2017-09-19 MED ORDER — ATORVASTATIN CALCIUM 20 MG PO TABS
80.0000 mg | ORAL_TABLET | Freq: Every day | ORAL | Status: DC
  Administered 2017-09-19 – 2017-09-21 (×3): 80 mg via ORAL
  Filled 2017-09-19 (×3): qty 4

## 2017-09-19 MED ORDER — ACETAMINOPHEN 325 MG PO TABS
650.0000 mg | ORAL_TABLET | Freq: Four times a day (QID) | ORAL | Status: DC | PRN
  Administered 2017-09-20 – 2017-09-22 (×2): 650 mg via ORAL
  Filled 2017-09-19 (×2): qty 2

## 2017-09-19 MED ORDER — PANTOPRAZOLE SODIUM 40 MG PO TBEC
40.0000 mg | DELAYED_RELEASE_TABLET | Freq: Every day | ORAL | Status: DC
  Administered 2017-09-20 – 2017-09-22 (×3): 40 mg via ORAL
  Filled 2017-09-19 (×3): qty 1

## 2017-09-19 MED ORDER — FLUTICASONE-UMECLIDIN-VILANT 100-62.5-25 MCG/INH IN AEPB: 1.0000 | INHALATION_SPRAY | Freq: Every day | RESPIRATORY_TRACT | Status: DC

## 2017-09-19 MED ORDER — METHYLPREDNISOLONE SODIUM SUCC 125 MG IJ SOLR
60.0000 mg | Freq: Four times a day (QID) | INTRAMUSCULAR | Status: DC
  Administered 2017-09-19 – 2017-09-20 (×4): 60 mg via INTRAVENOUS
  Filled 2017-09-19 (×4): qty 2

## 2017-09-19 MED ORDER — DILTIAZEM HCL ER COATED BEADS 120 MG PO CP24
120.0000 mg | ORAL_CAPSULE | Freq: Every day | ORAL | Status: DC
  Administered 2017-09-20 – 2017-09-22 (×3): 120 mg via ORAL
  Filled 2017-09-19 (×4): qty 1

## 2017-09-19 MED ORDER — SODIUM CHLORIDE 0.9% FLUSH: 3.0000 mL | INTRAVENOUS | Status: DC | PRN

## 2017-09-19 MED ORDER — IPRATROPIUM-ALBUTEROL 0.5-2.5 (3) MG/3ML IN SOLN
9.0000 mL | Freq: Once | RESPIRATORY_TRACT | Status: AC
  Administered 2017-09-19: 9 mL via RESPIRATORY_TRACT

## 2017-09-19 MED ORDER — SODIUM CHLORIDE 0.9% FLUSH
3.0000 mL | Freq: Two times a day (BID) | INTRAVENOUS | Status: DC
  Administered 2017-09-19 – 2017-09-22 (×6): 3 mL via INTRAVENOUS

## 2017-09-19 MED ORDER — DOCUSATE SODIUM 100 MG PO CAPS
100.0000 mg | ORAL_CAPSULE | Freq: Two times a day (BID) | ORAL | Status: DC
  Administered 2017-09-19 – 2017-09-22 (×5): 100 mg via ORAL
  Filled 2017-09-19 (×7): qty 1

## 2017-09-19 MED ORDER — ONDANSETRON HCL 4 MG/2ML IJ SOLN
INTRAMUSCULAR | Status: AC
  Administered 2017-09-19: 4 mg via INTRAVENOUS
  Filled 2017-09-19: qty 2

## 2017-09-19 MED ORDER — LORATADINE 10 MG PO TABS
10.0000 mg | ORAL_TABLET | Freq: Every day | ORAL | Status: DC
  Administered 2017-09-20 – 2017-09-22 (×3): 10 mg via ORAL
  Filled 2017-09-19 (×4): qty 1

## 2017-09-19 MED ORDER — METHYLPREDNISOLONE SODIUM SUCC 125 MG IJ SOLR
125.0000 mg | Freq: Once | INTRAMUSCULAR | Status: AC
  Administered 2017-09-19: 125 mg via INTRAVENOUS

## 2017-09-19 MED ORDER — CLOPIDOGREL BISULFATE 75 MG PO TABS
75.0000 mg | ORAL_TABLET | Freq: Every day | ORAL | Status: DC
  Administered 2017-09-20 – 2017-09-22 (×3): 75 mg via ORAL
  Filled 2017-09-19 (×4): qty 1

## 2017-09-19 MED ORDER — HEPARIN SODIUM (PORCINE) 5000 UNIT/ML IJ SOLN
5000.0000 [IU] | Freq: Three times a day (TID) | INTRAMUSCULAR | Status: DC
  Administered 2017-09-19 – 2017-09-21 (×7): 5000 [IU] via SUBCUTANEOUS
  Filled 2017-09-19 (×7): qty 1

## 2017-09-19 MED ORDER — ONDANSETRON HCL 4 MG/2ML IJ SOLN
4.0000 mg | Freq: Once | INTRAMUSCULAR | Status: AC
  Administered 2017-09-19: 4 mg via INTRAVENOUS

## 2017-09-19 MED ORDER — SODIUM POLYSTYRENE SULFONATE 15 GM/60ML PO SUSP
30.0000 g | Freq: Once | ORAL | Status: AC
  Administered 2017-09-19: 30 g via ORAL
  Filled 2017-09-19: qty 120

## 2017-09-19 MED ORDER — ACETAMINOPHEN 650 MG RE SUPP: 650.0000 mg | Freq: Four times a day (QID) | RECTAL | Status: DC | PRN

## 2017-09-19 MED ORDER — VITAMIN D 1000 UNITS PO TABS
1000.0000 [IU] | ORAL_TABLET | Freq: Every day | ORAL | Status: DC
  Administered 2017-09-20 – 2017-09-22 (×3): 1000 [IU] via ORAL
  Filled 2017-09-19 (×4): qty 1

## 2017-09-19 MED ORDER — METHYLPREDNISOLONE SODIUM SUCC 125 MG IJ SOLR
INTRAMUSCULAR | Status: AC
  Administered 2017-09-19: 125 mg via INTRAVENOUS
  Filled 2017-09-19: qty 2

## 2017-09-19 MED ORDER — SODIUM CHLORIDE 0.9 % IV SOLN
250.0000 mL | INTRAVENOUS | Status: DC | PRN
Start: 2017-09-19 — End: 2017-09-22

## 2017-09-19 MED ORDER — FLEET ENEMA 7-19 GM/118ML RE ENEM: 1.0000 | ENEMA | Freq: Once | RECTAL | Status: DC | PRN

## 2017-09-19 MED ORDER — FLUOXETINE HCL 20 MG PO CAPS
20.0000 mg | ORAL_CAPSULE | Freq: Every day | ORAL | Status: DC
  Administered 2017-09-19 – 2017-09-20 (×2): 20 mg via ORAL
  Filled 2017-09-19 (×2): qty 1

## 2017-09-19 MED ORDER — ONDANSETRON HCL 4 MG PO TABS: 4.0000 mg | ORAL_TABLET | Freq: Four times a day (QID) | ORAL | Status: DC | PRN

## 2017-09-19 MED ORDER — ONDANSETRON HCL 4 MG/2ML IJ SOLN
4.0000 mg | Freq: Four times a day (QID) | INTRAMUSCULAR | Status: DC | PRN
  Administered 2017-09-20: 4 mg via INTRAVENOUS
  Filled 2017-09-19 (×2): qty 2

## 2017-09-19 MED ORDER — BISACODYL 5 MG PO TBEC: 5.0000 mg | DELAYED_RELEASE_TABLET | Freq: Every day | ORAL | Status: DC | PRN

## 2017-09-19 MED ORDER — HYDRALAZINE HCL 20 MG/ML IJ SOLN: 10.0000 mg | INTRAMUSCULAR | Status: DC | PRN

## 2017-09-19 MED ORDER — SENNA 8.6 MG PO TABS: 2.0000 | ORAL_TABLET | Freq: Every evening | ORAL | Status: DC | PRN

## 2017-09-19 MED ORDER — IPRATROPIUM-ALBUTEROL 0.5-2.5 (3) MG/3ML IN SOLN
3.0000 mL | Freq: Four times a day (QID) | RESPIRATORY_TRACT | Status: DC
  Administered 2017-09-19 – 2017-09-20 (×3): 3 mL via RESPIRATORY_TRACT
  Filled 2017-09-19 (×3): qty 3

## 2017-09-19 MED ORDER — HYDRALAZINE HCL 25 MG PO TABS
25.0000 mg | ORAL_TABLET | Freq: Three times a day (TID) | ORAL | Status: DC
  Administered 2017-09-19 – 2017-09-22 (×8): 25 mg via ORAL
  Filled 2017-09-19 (×8): qty 1

## 2017-09-19 MED ORDER — POLYETHYLENE GLYCOL 3350 17 G PO PACK: 17.0000 g | PACK | Freq: Every day | ORAL | Status: DC | PRN

## 2017-09-19 MED ORDER — IPRATROPIUM-ALBUTEROL 0.5-2.5 (3) MG/3ML IN SOLN
RESPIRATORY_TRACT | Status: AC
  Administered 2017-09-19: 9 mL via RESPIRATORY_TRACT
  Filled 2017-09-19: qty 9

## 2017-09-19 NOTE — ED Notes (Signed)
First Nurse Note: Patient to ED via [facility] from [facility] with complaint of [place].  On home 02 at 2L, ox sat - 89% with rapid pursed lip respirations.  Switched to O2 tank and taken to Room 18.

## 2017-09-19 NOTE — Patient Outreach (Signed)
[facility]) Care Management  09/19/2017  Katherine [name] [birth year] [id number]   Telephone Screen  Referral Date:09/05/17 Referral Source:MD office-Urgent Referral Reason:" patient is back with [place] and not at [facility] that was set up by children and not her or her husband's wishes, COPD, CKD stage 3, hospital discharge, frequent admission" Insurance:[employer]   Multiple attempts to establish contact with patient without success. No response from letter mailed to patient. Case is being closed at this time.    Plan: RN CM will close case at this time.   Enzo Montgomery, RN,BSN,CCM [place] Management Telephonic Care Management Coordinator Direct Phone: [phone] Toll Free: [phone] Fax: [phone]

## 2017-09-19 NOTE — Progress Notes (Signed)
Inpatient Diabetes Program Recommendations  AACE/ADA: New Consensus Statement on Inpatient Glycemic Control (2019)  Target Ranges:  Prepandial:   less than 140 mg/dL      Peak postprandial:   less than 180 mg/dL (1-2 hours)      Critically ill patients:  140 - 180 mg/dL   Results for Katherine [name], Katherine [name] (MRN [id number]) as of 09/19/2017 13:15  Ref. Range 09/19/2017 09:24  Glucose Latest Ref Range: 70 - 99 mg/dL 204 (H)  Results for Katherine [name], Katherine [name] (MRN [id number]) as of 09/19/2017 13:15  Ref. Range 09/12/2017 03:39  Hemoglobin A1C Latest Ref Range: 4.8 - 5.6 % 6.8 (H)   Review of Glycemic Control  Diabetes history: No Outpatient Diabetes medications: NA Current orders for Inpatient glycemic control: Solumedrol 60 mg Q6H  Inpatient Diabetes Program Recommendations:  Insulin-Correction: While inpatient and ordered steroids, please consider ordering CBGs with Novolog 0-9 units TID with meals and Novolog 0-5 units QHS for bedtime correction. Insulin-Meal Coverage: If steroids are continued, please consider ordering Novolog 3 units TID with meals if patient eats at least 50% of meals.  NOTE: Noted A1C on 09/12/17. Patient does not have a history of DM and per ADA, if A1C 6.5% or greater then criteria met to dx with DM. However, noted patient was an inpatient from 08/27/17-08/29/17 and was received Solumedrol for angioedema. Patient was inpatient from 09/11/17 to 09/15/17 and received steroids and was discharged on Prednisone on 09/15/17.  Patient is currently ordered Solumedrol 60 mg Q6H.  Recent steroids are contributing to elevated A1C and noted hyperglycemia.  Thanks, Barnie Alderman, RN, MSN, CDE Diabetes Coordinator Inpatient Diabetes Program [phone] (Team Pager from 8am to 5pm)

## 2017-09-19 NOTE — ED Notes (Signed)
RT to bedside to place BiPAP

## 2017-09-19 NOTE — ED Notes (Signed)
Hostpitalist to bedside at this time.

## 2017-09-19 NOTE — ED Notes (Signed)
RT notified to collect ABG. 

## 2017-09-19 NOTE — ED Notes (Signed)
Report called to ([phone])

## 2017-09-19 NOTE — Progress Notes (Signed)
Notified Dr. Jerelyn Charles if he still wants nursing  to administer Kayexalate. K: 5.0. Voiced concern re breathing pattern of pt during exertion, agreed to delay kayexalate until patient settles down. Asked for parameters for PO hydralazine. To hold next PO hydralazine dose. Will await for addl orders.

## 2017-09-19 NOTE — Progress Notes (Signed)
Family Meeting Note  Advance Directive:yes  Today a meeting took place with the Patient.  Patient is able to participate   The following clinical team members were present during this meeting:MD  The following were discussed:Patient's diagnosis: copd, Patient's progosis: Unable to determine and Goals for treatment: Full Code  Additional follow-up to be provided: prn  Time spent during discussion:20 minutes  Katherine [name] D Katherine Fukushima, MD  

## 2017-09-19 NOTE — ED Provider Notes (Signed)
[facility] Emergency Department Provider Note ___________________________________________   First MD Initiated Contact with Patient 09/19/17 [phone]     (approximate)  I have reviewed the triage vital signs and the nursing notes.   HISTORY  Chief Complaint Respiratory Arrest  HPI Katherine [name] is a 77 y.o. female with a history of COPD on 2 L home nasal cannula oxygen who is presenting to the emergency department with shortness of breath starting this morning.  She is not reporting any chest pain at this time.  Was found to be hypoxic and was sent over directly from the [place] clinic.  Does not report fever.  Past Medical History:  Diagnosis Date  . Angioedema   . Anxiety   . Anxiety and depression   . Chronic constipation   . Chronic kidney disease    stage 3  . COPD (chronic obstructive pulmonary disease) ([place])   . Gallstones   . GERD (gastroesophageal reflux disease)   . Hypertension   . Left ventricular hypertrophy   . Osteoarthritis   . Prurigo nodularis   . Tobacco abuse   . Vitamin D deficiency disease     Patient Active Problem List   Diagnosis Date Noted  . CAP (community acquired pneumonia) 09/11/2017  . Elevated rheumatoid factor 09/05/2017  . COPD exacerbation ([place]) 08/27/2017  . Frequent hospital admissions 08/22/2017  . Aphasia 06/28/2017  . Acute on chronic respiratory failure with hypoxia ([place]) 06/12/2017  . Hand pain 03/21/2017  . Dry skin 03/21/2017  . Pain in finger of left hand 09/12/2016  . Chronic fatigue 06/12/2016  . Left knee pain 06/12/2016  . Advanced care planning/counseling discussion 03/13/2016  . Primary localized osteoarthritis of right knee 02/08/2016  . OSA and COPD overlap syndrome ([place]) 09/16/2015  . Rotator cuff syndrome 09/07/2015  . 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 Sleep disturbance 04/14/2015  . Coronary artery disease 03/14/2015  . 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/2014  . Anxiety   . Tobacco abuse   . Prurigo nodularis   . GERD (gastroesophageal reflux disease)   . COPD (chronic obstructive pulmonary disease) ([place])   . Osteoarthritis   . Vitamin D deficiency disease   . Chronic constipation   . CKD (chronic kidney disease), stage III ([place])   . LVH (left ventricular hypertrophy) 05/20/2013  . Essential hypertension 05/20/2013  . Morbid obesity ([place]) 05/20/2013    Past Surgical History:  Procedure Laterality Date  . CHOLECYSTECTOMY    . 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   Procedure: TOTAL KNEE ARTHROPLASTY;  Surgeon: Hessie Knows, MD;  Location: [facility] ORS;  Service: Orthopedics;  Laterality: Right;    Prior to Admission medications   Medication Sig Start Date End Date Taking? Authorizing Provider  acetaminophen (TYLENOL) 650 MG CR tablet Take 650-1,300 mg by mouth every 8 (eight) hours as needed for pain.     [provider]  albuterol (ACCUNEB) 0.63 MG/3ML nebulizer solution Take 3 mLs (0.63 mg total) by nebulization 3 (three) times daily as needed for wheezing. DX:J44.9 07/23/17   [name], MD  albuterol (PROVENTIL HFA) 108 (90 Base) MCG/ACT inhaler Inhale 2 puffs into the lungs every 4 (four) hours as needed for wheezing or shortness of breath. 05/30/17   Loletha Grayer, MD  atorvastatin (LIPITOR) 80 MG tablet Take 80 mg by mouth daily. 08/02/17   [provider]  cetirizine (ZYRTEC) 10 MG tablet  TAKE 1/2 TABLET(5 MG) BY MOUTH AT BEDTIME 07/25/17   Johnson, Megan P, DO  cholecalciferol (VITAMIN D) 1000 units tablet Take 1,000 Units by mouth daily.    [provider]  clopidogrel (PLAVIX) 75 MG tablet Take 1 tablet (75 mg total) by mouth daily. 09/05/17   [name], NP  diltiazem (CARDIZEM CD) 120 MG 24 hr capsule Take 120 mg by mouth daily.    [provider]  FLUoxetine (PROZAC) 10 MG capsule TAKE 1 CAPSULE(10 MG) BY MOUTH DAILY 07/25/17   Johnson,  Megan P, DO  Fluticasone-Umeclidin-Vilant (TRELEGY ELLIPTA) 100-62.5-25 MCG/INH AEPB Inhale 1 puff into the lungs daily. 08/09/17   [name], MD  gabapentin (NEURONTIN) 300 MG capsule Take 1 capsule (300 mg total) by mouth at bedtime. 09/05/17   [name], NP  hydrALAZINE (APRESOLINE) 25 MG tablet Take 1 tablet (25 mg total) by mouth 3 (three) times daily. 09/05/17   [name], NP  ipratropium (ATROVENT) 0.03 % nasal spray Place 2 sprays into both nostrils every 12 (twelve) hours.    [provider]  ipratropium-albuterol (DUONEB) 0.5-2.5 (3) MG/3ML SOLN USE 1 VIAL VIA NEBULIZER EVERY 6 HOURS AS NEEDED 07/23/17   [name], MD  methocarbamol (ROBAXIN) 500 MG tablet Take 1 tablet (500MG ) by mouth every 8 hours as needed for muscle spasms 08/08/17   [provider]  pantoprazole (PROTONIX) 40 MG tablet Take 1 tablet (40 mg total) by mouth daily. 09/05/17   [name], NP  promethazine (PHENERGAN) 12.5 MG tablet Take 12.5 mg by mouth every 6 (six) hours as needed for nausea or vomiting.     [provider]  senna (SENOKOT) 8.6 MG tablet Take 2 tablets by mouth at bedtime as needed for constipation.     [provider]  tiotropium (SPIRIVA) 18 MCG inhalation capsule Place 18 mcg into inhaler and inhale daily.    [provider]    Allergies Bee venom; Enalapril maleate; and Other  Family History  Problem Relation Age of Onset  . Alcohol abuse Mother   . Sickle cell anemia Daughter   . Hypertension Son   . Cancer Neg Hx   . COPD Neg Hx   . Diabetes Neg Hx   . Heart disease Neg Hx   . Stroke Neg Hx     Social History Social History   Tobacco Use  . Smoking status: Former Smoker    Packs/day: 0.50    Years: 60.00    Pack years: 30.00    Types: Cigarettes  . Smokeless tobacco: Never Used  Substance Use Topics  . Alcohol use: No    Alcohol/week: 0.0 oz    Comment: rare  . Drug use: No    Review of  Systems  Constitutional: No fever/chills Eyes: No visual changes. ENT: No sore throat. Cardiovascular: Denies chest pain. Respiratory: As above Gastrointestinal: No abdominal pain.  No nausea, no vomiting.  No diarrhea.  No constipation. Genitourinary: Negative for dysuria. Musculoskeletal: Negative for back pain. Skin: Negative for rash. Neurological: Negative for headaches, focal weakness or numbness.   ____________________________________________   PHYSICAL EXAM:  VITAL SIGNS: ED Triage Vitals  Enc Vitals Group     BP      Pulse      Resp      Temp      Temp src      SpO2      Weight      Height  Head Circumference      Peak Flow      Pain Score      Pain Loc      Pain Edu?      Excl. in [place]?     Constitutional: Alert and oriented.  Pursed lip breathing with tachypnea.  Requiring 4 L of nasal cannula oxygen to keep oxygen saturation above 89%. Eyes: Conjunctivae are normal.  Head: Atraumatic. Nose: No congestion/rhinnorhea. Mouth/Throat: Mucous membranes are moist.  Neck: No stridor.   Cardiovascular: Normal rate, regular rhythm. Grossly normal heart sounds.  Respiratory: Pursed lip breathing.  Tachypneic.  Almost no air movement auscultated throughout all fields. Gastrointestinal: Soft and nontender. No distention. No CVA tenderness. Musculoskeletal: Moderate bilateral lower extremity edema. Neurologic:  Normal speech and language. No gross focal neurologic deficits are appreciated. Skin:  Skin is warm, dry and intact. No rash noted.   ____________________________________________   LABS (all labs ordered are listed, but only abnormal results are displayed)  Labs Reviewed  CBC WITH DIFFERENTIAL/PLATELET  BASIC METABOLIC PANEL  BRAIN NATRIURETIC PEPTIDE  TROPONIN I   ____________________________________________  EKG  ED ECG REPORT I, Doran Stabler, the attending physician, personally viewed and interpreted this ECG.   Date: 09/19/2017   EKG Time: 0919  Rate: 107  Rhythm: sinus tachycardia  Axis: Normal  Intervals:left anterior fascicular block  ST&T Change: No ST segment elevation or depression.  Single T wave inversion in aVL.  ____________________________________________  RADIOLOGY  Mild CHF ____________________________________________   PROCEDURES  Procedure(s) performed:   .Critical Care Performed by: Orbie Pyo, MD Authorized by: Orbie Pyo, MD   Critical care provider statement:    Critical care time (minutes):  35   Critical care time was exclusive of:  Separately billable procedures and treating other patients   Critical care was necessary to treat or prevent imminent or life-threatening deterioration of the following conditions:  Respiratory failure   Critical care was time spent personally by me on the following activities:  Development of treatment plan with patient or surrogate, discussions with consultants, evaluation of patient's response to treatment, examination of patient, obtaining history from patient or surrogate, ordering and performing treatments and interventions, ordering and review of laboratory studies, ordering and review of radiographic studies, pulse oximetry, re-evaluation of patient's condition and review of old charts    Critical Care performed:   ____________________________________________   INITIAL IMPRESSION / ASSESSMENT AND PLAN / ED COURSE  Pertinent labs & imaging results that were available during my care of the patient were reviewed by me and considered in my medical decision making (see chart for details).  Differential includes, but is not limited to, viral syndrome, bronchitis including COPD exacerbation, pneumonia, reactive airway disease including asthma, CHF including exacerbation with or without pulmonary/interstitial edema, pneumothorax, ACS, thoracic trauma, and pulmonary embolism. As part of my medical decision making, I reviewed  the following data within the electronic MEDICAL RECORD NUMBER Notes from prior ED visits  ----------------------------------------- 10:11 AM on 09/19/2017 -----------------------------------------  Patient at this time tolerating the BiPAP well.  Still tachypneic but not with respiratory distress.  I believe she will require admission to the hospital.  To be signed out to the hospitalist.  Also with mild acute on chronic renal failure. ____________________________________________   FINAL CLINICAL IMPRESSION(S) / ED DIAGNOSES  COPD.  CHF.  Acute on chronic renal failure.   NEW MEDICATIONS STARTED DURING THIS VISIT:  New Prescriptions   No medications on file  Note:  This document was prepared using Dragon voice recognition software and may include unintentional dictation errors.     Orbie Pyo, MD 09/19/17 1011

## 2017-09-19 NOTE — ED Triage Notes (Signed)
Pt in via POV with complaints acute onset respiratory distress upon waking, rapidly worsening.  Pt with increased work of breathing, hypoxic on baseline 2L nasal cannula.   EDP called to bedside.

## 2017-09-19 NOTE — H&P (Signed)
[place] [facility] [place] NAME: Katherine [name]    MR#:  [id number]  DATE OF BIRTH:  1941/02/01  DATE OF ADMISSION:  09/19/2017  PRIMARY CARE PHYSICIAN: Valerie Roys, DO   REQUESTING/REFERRING PHYSICIAN:   CHIEF COMPLAINT:   Chief Complaint  Patient presents with  . Respiratory Arrest    HISTORY OF PRESENT ILLNESS: Katherine [name]  is a 77 y.o. female with a known history per below which includes multiple frequent hospital admissions for COPD exacerbation-last discharge 3 days ago, on chronic O2 at home, presents to the emergency room with acute shortness of breath that started early this morning, in the emergency room patient was noted to be satting 8889%, placed on BiPAP, patient evaluated in the emergency room, no apparent distress, husband at the bedside, patient uncooperative for physical examination, ER work-up largely unimpressive, chest x-ray noted for mild CHF-thought to be due to overcall given morbid obesity, patient is now being admitted for acute on chronic hypoxic respiratory failure due to very mild COPD exacerbation.  PAST MEDICAL HISTORY:   Past Medical History:  Diagnosis Date  . Angioedema   . Anxiety   . Anxiety and depression   . Chronic constipation   . Chronic kidney disease    stage 3  . COPD (chronic obstructive pulmonary disease) ([place])   . Gallstones   . GERD (gastroesophageal reflux disease)   . Hypertension   . Left ventricular hypertrophy   . Osteoarthritis   . Prurigo nodularis   . Tobacco abuse   . Vitamin D deficiency disease     PAST SURGICAL HISTORY:  Past Surgical History:  Procedure Laterality Date  . CHOLECYSTECTOMY    . 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   Procedure: TOTAL KNEE ARTHROPLASTY;  Surgeon: Hessie Knows, MD;  Location: [facility] ORS;  Service: Orthopedics;  Laterality: Right;    SOCIAL HISTORY:  Social History   Tobacco Use  . Smoking  status: Former Smoker    Packs/day: 0.50    Years: 60.00    Pack years: 30.00    Types: Cigarettes  . Smokeless tobacco: Never Used  Substance Use Topics  . Alcohol use: No    Alcohol/week: 0.0 oz    Comment: rare    FAMILY HISTORY:  Family History  Problem Relation Age of Onset  . Alcohol abuse Mother   . Sickle cell anemia Daughter   . Hypertension Son   . Cancer Neg Hx   . COPD Neg Hx   . Diabetes Neg Hx   . Heart disease Neg Hx   . Stroke Neg Hx     DRUG ALLERGIES:  Allergies  Allergen Reactions  . Bee Venom Swelling  . Enalapril Maleate Swelling    REVIEW OF SYSTEMS:   CONSTITUTIONAL: No fever, +fatigue, weakness.  EYES: No blurred or double vision.  EARS, NOSE, AND THROAT: No tinnitus or ear pain.  RESPIRATORY: No cough, +shortness of breath, wheezing  CARDIOVASCULAR: No chest pain, orthopnea, edema.  GASTROINTESTINAL: No nausea, vomiting, diarrhea or abdominal pain.  GENITOURINARY: No dysuria, hematuria.  ENDOCRINE: No polyuria, nocturia,  HEMATOLOGY: No anemia, easy bruising or bleeding SKIN: No rash or lesion. MUSCULOSKELETAL: No joint pain or arthritis.   NEUROLOGIC: No tingling, numbness, weakness.  PSYCHIATRY: No anxiety or depression.   MEDICATIONS AT HOME:  Prior to Admission medications   Medication Sig Start Date End Date Taking? Authorizing Provider  acetaminophen (TYLENOL) 650  MG CR tablet Take 650-1,300 mg by mouth every 8 (eight) hours as needed for pain.    Yes [provider]  albuterol (ACCUNEB) 0.63 MG/3ML nebulizer solution Take 3 mLs (0.63 mg total) by nebulization 3 (three) times daily as needed for wheezing. DX:J44.9 07/23/17  Yes [name], MD  albuterol (PROVENTIL HFA) 108 (90 Base) MCG/ACT inhaler Inhale 2 puffs into the lungs every 4 (four) hours as needed for wheezing or shortness of breath. 05/30/17  Yes Wieting, Richard, MD  atorvastatin (LIPITOR) 80 MG tablet Take 80 mg by mouth daily. 08/02/17  Yes [provider]  cetirizine (ZYRTEC) 10 MG tablet TAKE 1/2 TABLET(5 MG) BY MOUTH AT BEDTIME Patient taking differently: TAKE 15MG  BY MOUTH DAILY 07/25/17  Yes Johnson, Megan P, DO  cholecalciferol (VITAMIN D) 1000 units tablet Take 1,000 Units by mouth daily.   Yes [provider]  clopidogrel (PLAVIX) 75 MG tablet Take 1 tablet (75 mg total) by mouth daily. 09/05/17  Yes [name], NP  diltiazem (CARDIZEM CD) 120 MG 24 hr capsule Take 120 mg by mouth daily.   Yes [provider]  FLUoxetine (PROZAC) 10 MG capsule TAKE 1 CAPSULE(10 MG) BY MOUTH DAILY 07/25/17  Yes Johnson, Megan P, DO  Fluticasone-Umeclidin-Vilant (TRELEGY ELLIPTA) 100-62.5-25 MCG/INH AEPB Inhale 1 puff into the lungs daily. 08/09/17  Yes [name], MD  gabapentin (NEURONTIN) 300 MG capsule Take 1 capsule (300 mg total) by mouth at bedtime. 09/05/17  Yes [name], NP  hydrALAZINE (APRESOLINE) 25 MG tablet Take 1 tablet (25 mg total) by mouth 3 (three) times daily. 09/05/17  Yes [name], NP  ipratropium (ATROVENT) 0.03 % nasal spray Place 2 sprays into both nostrils every 12 (twelve) hours.   Yes [provider]  ipratropium-albuterol (DUONEB) 0.5-2.5 (3) MG/3ML SOLN USE 1 VIAL VIA NEBULIZER EVERY 6 HOURS AS NEEDED 07/23/17  Yes [name], MD  pantoprazole (PROTONIX) 40 MG tablet Take 1 tablet (40 mg total) by mouth daily. 09/05/17  Yes [name], NP  predniSONE (DELTASONE) 10 MG tablet Take 1 tablet by mouth taper from 4 doses each day to 1 dose and stop. 09/18/17  Yes [provider]  tiotropium (SPIRIVA) 18 MCG inhalation capsule Place 18 mcg into inhaler and inhale daily.   Yes [provider]  methocarbamol (ROBAXIN) 500 MG tablet Take 1 tablet (500MG ) by mouth every 8 hours as needed for muscle spasms 08/08/17   [provider]  promethazine (PHENERGAN) 12.5 MG tablet Take 12.5 mg by mouth every 6 (six) hours as needed for nausea or vomiting.      [provider]  senna (SENOKOT) 8.6 MG tablet Take 2 tablets by mouth at bedtime as needed for constipation.     [provider]      PHYSICAL EXAMINATION:   VITAL SIGNS: Blood pressure 104/66, pulse 98, temperature 98.6 F (37 C), temperature source Oral, resp. rate (!) 25, height 5\' 2"  (1.575 m), weight 123.8 kg (273 lb), SpO2 93 %.  GENERAL:  77 y.o.-year-old patient lying in the bed with no acute distress.  Morbidly obese, nontoxic-appearing EYES: Pupils equal, round, reactive to light and accommodation. No scleral icterus. Extraocular muscles intact.  HEENT: Head atraumatic, normocephalic. Oropharynx and nasopharynx clear.  NECK:  Supple, no jugular venous distention. No thyroid enlargement, no tenderness.  LUNGS: Diminished breath sounds throughout. No use of accessory muscles of respiration.  CARDIOVASCULAR: S1, S2 normal. No murmurs, rubs, or gallops.  ABDOMEN:  Soft, nontender, nondistended. Bowel sounds present. No organomegaly or mass.  EXTREMITIES: No pedal edema, cyanosis, or clubbing.  NEUROLOGIC: Cranial nerves II through XII are intact. MAES. Gait not checked.  PSYCHIATRIC: The patient is alert and oriented x 3.  SKIN: No obvious rash, lesion, or ulcer.   LABORATORY PANEL:   CBC Recent Labs  Lab 09/19/17 0924  WBC 9.4  HGB 11.1*  HCT 33.8*  PLT 189  MCV 87.1  MCH 28.5  MCHC 32.7  RDW 18.5*  LYMPHSABS 0.9*  MONOABS 0.5  EOSABS 0.2  BASOSABS 0.1   ------------------------------------------------------------------------------------------------------------------  Chemistries  Recent Labs  Lab 09/14/17 0359 09/19/17 0924  NA 141 142  K 4.6 5.0  CL 104 104  CO2 31 30  GLUCOSE 192* 204*  BUN 45* 30*  CREATININE 1.74* 1.97*  CALCIUM 8.4* 8.7*   ------------------------------------------------------------------------------------------------------------------ estimated creatinine clearance is 31 mL/min (A) (by C-G formula based on SCr  of 1.97 mg/dL (H)). ------------------------------------------------------------------------------------------------------------------ No results for input(s): TSH, T4TOTAL, T3FREE, THYROIDAB in the last 72 hours.  Invalid input(s): FREET3   Coagulation profile No results for input(s): INR, PROTIME in the last 168 hours. ------------------------------------------------------------------------------------------------------------------- No results for input(s): DDIMER in the last 72 hours. -------------------------------------------------------------------------------------------------------------------  Cardiac Enzymes Recent Labs  Lab 09/19/17 0924  TROPONINI <0.03   ------------------------------------------------------------------------------------------------------------------ Invalid input(s): POCBNP  ---------------------------------------------------------------------------------------------------------------  Urinalysis    Component Value Date/Time   COLORURINE YELLOW (A) 07/07/2017 1845   APPEARANCEUR CLEAR (A) 07/07/2017 1845   APPEARANCEUR Clear 02/20/2013 1459   LABSPEC 1.011 07/07/2017 1845   LABSPEC 1.012 02/20/2013 1459   PHURINE 5.0 07/07/2017 1845   GLUCOSEU NEGATIVE 07/07/2017 1845   GLUCOSEU Negative 02/20/2013 1459   HGBUR NEGATIVE 07/07/2017 1845   BILIRUBINUR NEGATIVE 07/07/2017 1845   BILIRUBINUR Negative 02/20/2013 1459   KETONESUR NEGATIVE 07/07/2017 1845   PROTEINUR NEGATIVE 07/07/2017 1845   NITRITE NEGATIVE 07/07/2017 1845   LEUKOCYTESUR NEGATIVE 07/07/2017 1845   LEUKOCYTESUR Negative 02/20/2013 1459     RADIOLOGY: Dg Chest 1 View  Result Date: 09/19/2017 CLINICAL DATA:  Acute onset respiratory distress up on walking. EXAM: CHEST  1 VIEW COMPARISON:  September 11, 2017 FINDINGS: The heart size and mediastinal contours are stable. The heart size is enlarged. There is mild diffuse increased pulmonary interstitium bilaterally. Mild atelectasis  versus scar of both lung bases are noted. There is no focal pneumonia or pleural effusion. The visualized skeletal structures are unremarkable. IMPRESSION: Mild congestive heart failure. Mild atelectasis versus scar of bilateral lung bases. Electronically Signed   By: Abelardo Diesel M.D.   On: 09/19/2017 09:58    EKG: Orders placed or performed during the hospital encounter of 09/19/17  . EKG 12-Lead  . EKG 12-Lead  . ED EKG  . ED EKG    IMPRESSION AND PLAN: *Acute on chronic hypoxic respiratory failure due to very mild COPD exacerbation *Acute on COPD exacerbation-frequent admissions for this diagnosis *Chronic snoring *Chronic extreme morbid obesity *Chronic kidney disease stage III *Chronic GERD without esophagitis  Refer to the observation unit, short course of IV Solu-Medrol, check ABG, respiratory therapy to see, aggressive pulmonary toilet with bronchodilator therapy, wean O2 to baseline requirement of 2 L via nasal cannula continuous, suggests follow-up with pulmonology status post discharge for pulmonary function testing/sleep study to evaluate for possible obstructive sleep apnea given history of snoring, recommend weight loss, PPI daily, and discharged home on tomorrow barring any complications   All the records are reviewed and case discussed with ED provider. Management plans  discussed with the patient, family and they are in agreement.  CODE STATUS:full Code Status History    Date Active Date Inactive Code Status Order ID Comments User Context   09/11/2017 2021 09/15/2017 1510 Full Code [id number]  Gorden Harms, MD Inpatient   08/27/2017 1543 08/29/2017 2159 Full Code [id number]  Hillary Bow, MD ED   06/28/2017 1414 06/30/2017 1748 Full Code [id number]  Benay Pomeroy, Avel Peace, MD Inpatient   06/12/2017 0858 06/16/2017 1659 DNR [id number]  Demetrios Loll, MD Inpatient   05/26/2017 1732 05/30/2017 1446 DNR [id number]  Hillary Bow, MD ED   05/17/2017 1228 05/21/2017 1511 DNR [id number]   Asencion Gowda, NP Inpatient   04/21/2017 0117 04/22/2017 1750 DNR [id number]  Lance Coon, MD Inpatient   04/05/2017 1411 04/08/2017 2101 Full Code [id number]  Saundra Shelling, MD Inpatient   02/08/2016 1414 02/11/2016 1729 Full Code [id number]  Hessie Knows, MD Inpatient   11/27/2015 1645 11/29/2015 1628 Full Code [id number]  Idelle Crouch, MD Inpatient   02/10/2015 2122 02/12/2015 1809 Full Code [id number]  Lytle Butte, MD ED   09/16/2014 1220 09/19/2014 1736 Full Code [id number]  Aldean Jewett, MD Inpatient       TOTAL TIME TAKING CARE OF THIS PATIENT: 45 minutes.    Avel Peace Aqeel Norgaard M.D on 09/19/2017   Between 7am to 6pm - Pager - [phone]  After 6pm go to [email or web address] - password [email or web address] [facility]  Office  [phone]  CC: Primary care physician; Valerie Roys, DO   Note: This dictation was prepared with Dragon dictation along with smaller phrase technology. Any transcriptional errors that result from this process are unintentional.

## 2017-09-19 NOTE — ED Notes (Signed)
Patient taken off Bipap per MD Salary and put on nasal cannula. Respiratory at bedside.

## 2017-09-20 DIAGNOSIS — F329 Major depressive disorder, single episode, unspecified: Secondary | ICD-10-CM | POA: Diagnosis present

## 2017-09-20 DIAGNOSIS — Z7902 Long term (current) use of antithrombotics/antiplatelets: Secondary | ICD-10-CM | POA: Diagnosis not present

## 2017-09-20 DIAGNOSIS — E559 Vitamin D deficiency, unspecified: Secondary | ICD-10-CM | POA: Diagnosis present

## 2017-09-20 DIAGNOSIS — R42 Dizziness and giddiness: Secondary | ICD-10-CM | POA: Diagnosis not present

## 2017-09-20 DIAGNOSIS — Z9049 Acquired absence of other specified parts of digestive tract: Secondary | ICD-10-CM | POA: Diagnosis not present

## 2017-09-20 DIAGNOSIS — Z9981 Dependence on supplemental oxygen: Secondary | ICD-10-CM | POA: Diagnosis not present

## 2017-09-20 DIAGNOSIS — J449 Chronic obstructive pulmonary disease, unspecified: Secondary | ICD-10-CM | POA: Diagnosis present

## 2017-09-20 DIAGNOSIS — Z6841 Body Mass Index (BMI) 40.0 and over, adult: Secondary | ICD-10-CM | POA: Diagnosis not present

## 2017-09-20 DIAGNOSIS — Z91030 Bee allergy status: Secondary | ICD-10-CM | POA: Diagnosis not present

## 2017-09-20 DIAGNOSIS — I2510 Atherosclerotic heart disease of native coronary artery without angina pectoris: Secondary | ICD-10-CM | POA: Diagnosis present

## 2017-09-20 DIAGNOSIS — N183 Chronic kidney disease, stage 3 (moderate): Secondary | ICD-10-CM | POA: Diagnosis present

## 2017-09-20 DIAGNOSIS — Z79899 Other long term (current) drug therapy: Secondary | ICD-10-CM | POA: Diagnosis not present

## 2017-09-20 DIAGNOSIS — K5909 Other constipation: Secondary | ICD-10-CM | POA: Diagnosis present

## 2017-09-20 DIAGNOSIS — N179 Acute kidney failure, unspecified: Secondary | ICD-10-CM | POA: Diagnosis present

## 2017-09-20 DIAGNOSIS — E875 Hyperkalemia: Secondary | ICD-10-CM | POA: Diagnosis present

## 2017-09-20 DIAGNOSIS — J9621 Acute and chronic respiratory failure with hypoxia: Secondary | ICD-10-CM | POA: Diagnosis present

## 2017-09-20 DIAGNOSIS — Z87891 Personal history of nicotine dependence: Secondary | ICD-10-CM | POA: Diagnosis not present

## 2017-09-20 DIAGNOSIS — I129 Hypertensive chronic kidney disease with stage 1 through stage 4 chronic kidney disease, or unspecified chronic kidney disease: Secondary | ICD-10-CM | POA: Diagnosis present

## 2017-09-20 DIAGNOSIS — Z8249 Family history of ischemic heart disease and other diseases of the circulatory system: Secondary | ICD-10-CM | POA: Diagnosis not present

## 2017-09-20 DIAGNOSIS — Z96651 Presence of right artificial knee joint: Secondary | ICD-10-CM | POA: Diagnosis present

## 2017-09-20 DIAGNOSIS — F419 Anxiety disorder, unspecified: Secondary | ICD-10-CM | POA: Diagnosis present

## 2017-09-20 DIAGNOSIS — K219 Gastro-esophageal reflux disease without esophagitis: Secondary | ICD-10-CM | POA: Diagnosis present

## 2017-09-20 DIAGNOSIS — G4733 Obstructive sleep apnea (adult) (pediatric): Secondary | ICD-10-CM | POA: Diagnosis present

## 2017-09-20 DIAGNOSIS — J441 Chronic obstructive pulmonary disease with (acute) exacerbation: Secondary | ICD-10-CM | POA: Diagnosis present

## 2017-09-20 LAB — GLUCOSE, CAPILLARY
Glucose-Capillary: 375 mg/dL — ABNORMAL HIGH (ref 70–99)
Glucose-Capillary: 235 mg/dL — ABNORMAL HIGH (ref 70–99)
Glucose-Capillary: 250 mg/dL — ABNORMAL HIGH (ref 70–99)

## 2017-09-20 LAB — BASIC METABOLIC PANEL
Anion gap: 8 (ref 5–15)
Anion gap: 9 (ref 5–15)
BUN: 36 mg/dL — AB (ref 8–23)
BUN: 36 mg/dL — AB (ref 8–23)
CALCIUM: 8.1 mg/dL — AB (ref 8.9–10.3)
CHLORIDE: 101 mmol/L (ref 98–111)
CO2: 29 mmol/L (ref 22–32)
CO2: 28 mmol/L (ref 22–32)
CREATININE: 1.79 mg/dL — AB (ref 0.44–1.00)
Calcium: 8.4 mg/dL — ABNORMAL LOW (ref 8.9–10.3)
Chloride: 99 mmol/L (ref 98–111)
Creatinine, Ser: 1.71 mg/dL — ABNORMAL HIGH (ref 0.44–1.00)
GFR calc Af Amer: 33 mL/min — ABNORMAL LOW (ref >60)
GFR calc non Af Amer: 27 mL/min — ABNORMAL LOW (ref >60)
GFR calc non Af Amer: 28 mL/min — ABNORMAL LOW (ref >60)
GFR, EST AFRICAN AMERICAN: 31 mL/min — AB (ref >60)
GLUCOSE: 255 mg/dL — AB (ref 70–99)
Glucose, Bld: 442 mg/dL — ABNORMAL HIGH (ref 70–99)
POTASSIUM: 4.7 mmol/L (ref 3.5–5.1)
Potassium: 5.5 mmol/L — ABNORMAL HIGH (ref 3.5–5.1)
Sodium: 136 mmol/L (ref 135–145)
Sodium: 138 mmol/L (ref 135–145)

## 2017-09-20 MED ORDER — IPRATROPIUM-ALBUTEROL 0.5-2.5 (3) MG/3ML IN SOLN: 3.0000 mL | Freq: Three times a day (TID) | RESPIRATORY_TRACT | Status: DC

## 2017-09-20 MED ORDER — ALPRAZOLAM 0.5 MG PO TABS
0.5000 mg | ORAL_TABLET | Freq: Two times a day (BID) | ORAL | Status: DC | PRN
  Administered 2017-09-20: 0.5 mg via ORAL
  Filled 2017-09-20: qty 1

## 2017-09-20 MED ORDER — SODIUM POLYSTYRENE SULFONATE 15 GM/60ML PO SUSP
30.0000 g | Freq: Once | ORAL | Status: AC
  Administered 2017-09-20: 30 g via ORAL
  Filled 2017-09-20: qty 120

## 2017-09-20 MED ORDER — FLUOXETINE HCL 10 MG PO CAPS
10.0000 mg | ORAL_CAPSULE | Freq: Every day | ORAL | Status: DC
  Administered 2017-09-21 – 2017-09-22 (×2): 10 mg via ORAL
  Filled 2017-09-20 (×2): qty 1

## 2017-09-20 MED ORDER — INSULIN ASPART 100 UNIT/ML ~~LOC~~ SOLN
0.0000 [IU] | Freq: Three times a day (TID) | SUBCUTANEOUS | Status: DC
  Administered 2017-09-20: 3 [IU] via SUBCUTANEOUS
  Administered 2017-09-20: 9 [IU] via SUBCUTANEOUS
  Administered 2017-09-21: 2 [IU] via SUBCUTANEOUS
  Administered 2017-09-21: 7 [IU] via SUBCUTANEOUS
  Administered 2017-09-21: 2 [IU] via SUBCUTANEOUS
  Filled 2017-09-20 (×5): qty 1

## 2017-09-20 MED ORDER — METHYLPREDNISOLONE SODIUM SUCC 40 MG IJ SOLR
40.0000 mg | Freq: Two times a day (BID) | INTRAMUSCULAR | Status: DC
  Administered 2017-09-20 – 2017-09-21 (×2): 40 mg via INTRAVENOUS
  Filled 2017-09-20 (×2): qty 1

## 2017-09-20 MED ORDER — ARFORMOTEROL TARTRATE 15 MCG/2ML IN NEBU
15.0000 ug | INHALATION_SOLUTION | Freq: Two times a day (BID) | RESPIRATORY_TRACT | Status: DC
  Administered 2017-09-20 – 2017-09-22 (×4): 15 ug via RESPIRATORY_TRACT
  Filled 2017-09-20 (×7): qty 2

## 2017-09-20 MED ORDER — FUROSEMIDE 10 MG/ML IJ SOLN
20.0000 mg | Freq: Once | INTRAMUSCULAR | Status: AC
  Administered 2017-09-20: 20 mg via INTRAVENOUS
  Filled 2017-09-20: qty 4

## 2017-09-20 MED ORDER — IPRATROPIUM-ALBUTEROL 0.5-2.5 (3) MG/3ML IN SOLN
3.0000 mL | Freq: Four times a day (QID) | RESPIRATORY_TRACT | Status: DC
Start: 2017-09-20 — End: 2017-09-22
  Administered 2017-09-20 – 2017-09-22 (×7): 3 mL via RESPIRATORY_TRACT
  Filled 2017-09-20 (×8): qty 3

## 2017-09-20 MED ORDER — INSULIN ASPART 100 UNIT/ML ~~LOC~~ SOLN
0.0000 [IU] | Freq: Every day | SUBCUTANEOUS | Status: DC
  Administered 2017-09-20 – 2017-09-21 (×2): 2 [IU] via SUBCUTANEOUS
  Filled 2017-09-20 (×2): qty 1

## 2017-09-20 MED ORDER — GABAPENTIN 300 MG PO CAPS
300.0000 mg | ORAL_CAPSULE | Freq: Every day | ORAL | Status: DC
  Administered 2017-09-20 – 2017-09-21 (×2): 300 mg via ORAL
  Filled 2017-09-20 (×2): qty 1

## 2017-09-20 MED ORDER — BUDESONIDE 0.25 MG/2ML IN SUSP
0.2500 mg | Freq: Two times a day (BID) | RESPIRATORY_TRACT | Status: DC
  Administered 2017-09-20 – 2017-09-22 (×4): 0.25 mg via RESPIRATORY_TRACT
  Filled 2017-09-20 (×4): qty 2

## 2017-09-20 NOTE — Progress Notes (Signed)
[place] at [place] NAME: Katherine [name]    MR#:  [id number]  DATE OF BIRTH:  [birth year]  SUBJECTIVE:  CHIEF COMPLAINT: Shortness of breath is better than yesterday, denies wheezing today  REVIEW OF SYSTEMS:  CONSTITUTIONAL: No fever, fatigue or weakness.  EYES: No blurred or double vision.  EARS, NOSE, AND THROAT: No tinnitus or ear pain.  RESPIRATORY: Some cough, improving shortness of breath, denies wheezing or hemoptysis.  CARDIOVASCULAR: No chest pain, orthopnea, edema.  GASTROINTESTINAL: No nausea, vomiting, diarrhea or abdominal pain.  GENITOURINARY: No dysuria, hematuria.  ENDOCRINE: No polyuria, nocturia,  HEMATOLOGY: No anemia, easy bruising or bleeding SKIN: No rash or lesion. MUSCULOSKELETAL: No joint pain or arthritis.   NEUROLOGIC: No tingling, numbness, weakness.  PSYCHIATRY: No anxiety or depression.   DRUG ALLERGIES:   Allergies  Allergen Reactions  . Bee Venom Swelling  . Enalapril Maleate Swelling    VITALS:  Blood pressure 140/75, pulse 75, temperature 98.1 F (36.7 C), temperature source Oral, resp. rate 20, height 5\' 2"  (1.575 m), weight 123.1 kg, SpO2 96 %.  PHYSICAL EXAMINATION:  GENERAL:  77 y.o.-year-old patient lying in the bed with no acute distress.  EYES: Pupils equal, round, reactive to light and accommodation. No scleral icterus. Extraocular muscles intact.  HEENT: Head atraumatic, normocephalic. Oropharynx and nasopharynx clear.  NECK:  Supple, no jugular venous distention. No thyroid enlargement, no tenderness.  LUNGS: Moderate breath sounds bilaterally, no wheezing, rales,rhonchi or crepitation. No use of accessory muscles of respiration.  CARDIOVASCULAR: S1, S2 normal. No murmurs, rubs, or gallops.  ABDOMEN: Soft, nontender, nondistended. Bowel sounds present.   EXTREMITIES: No pedal edema, cyanosis, or clubbing.  NEUROLOGIC: Cranial nerves II through XII are intact. Muscle strength  5/5 in all extremities. Sensation intact. Gait not checked.  PSYCHIATRIC: The patient is alert and oriented x 3.  SKIN: No obvious rash, lesion, or ulcer.    LABORATORY PANEL:   CBC Recent Labs  Lab 09/19/17 0924  WBC 9.4  HGB 11.1*  HCT 33.8*  PLT 189   ------------------------------------------------------------------------------------------------------------------  Chemistries  Recent Labs  Lab 09/20/17 1033  NA 136  K 5.5*  CL 99  CO2 29  GLUCOSE 442*  BUN 36*  CREATININE 1.79*  CALCIUM 8.1*   ------------------------------------------------------------------------------------------------------------------  Cardiac Enzymes Recent Labs  Lab 09/19/17 0924  TROPONINI <0.03   ------------------------------------------------------------------------------------------------------------------  RADIOLOGY:  Dg Chest 1 View  Result Date: 09/19/2017 CLINICAL DATA:  Acute onset respiratory distress up on walking. EXAM: CHEST  1 VIEW COMPARISON:  September 11, 2017 FINDINGS: The heart size and mediastinal contours are stable. The heart size is enlarged. There is mild diffuse increased pulmonary interstitium bilaterally. Mild atelectasis versus scar of both lung bases are noted. There is no focal pneumonia or pleural effusion. The visualized skeletal structures are unremarkable. IMPRESSION: Mild congestive heart failure. Mild atelectasis versus scar of bilateral lung bases. Electronically Signed   By: Abelardo Diesel M.D.   On: 09/19/2017 09:58    EKG:   Orders placed or performed during the hospital encounter of 09/19/17  . EKG 12-Lead  . EKG 12-Lead  . ED EKG  . ED EKG  . EKG    ASSESSMENT AND PLAN:   *Acute on chronic hypoxic respiratory failure due to very mild COPD exacerbation Clinically getting better.  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 Management plans discussed with the patient, family and they are in agreement.  CODE STATUS: fc  TOTAL TIME TAKING CARE OF THIS PATIENT: 35 minutes.   POSSIBLE D/C IN  DAYS, DEPENDING ON CLINICAL CONDITION.  Note: This dictation was prepared with Dragon dictation along with smaller phrase technology. Any transcriptional errors that result from this process are unintentional.   Nicholes Mango M.D on 09/20/2017 at 2:25 PM  Between 7am to 6pm - Pager - [phone] After 6pm go to [email or web address] - password [email or web address] [facility]  Office  [phone]  CC: Primary care physician; Valerie Roys, DO

## 2017-09-20 NOTE — Care Management Obs Status (Signed)
[facility] NOTIFICATION   Patient Details  Name: Katherine [name] MRN: [id number] Date of Birth: [birth year]   Medicare Observation Status Notification Given:   yes    Beverly Sessions, RN 09/20/2017, 3:38 PM

## 2017-09-20 NOTE — Care Management (Signed)
Patient admitted from home with COPD.  Patient lives at home with husband.  PCP Wynetta Emery.  Patient states that her husband provides transportation.  Patient denies issues obtaining medications or issues with transportation.  Patient states that she has a cane, RW, and O2 in the home.  Patient is unsure who she has O2 with, but confirms that she has portable O2.  Patient is currently open with [facility].  Corene Cornea with [facility] aware of admission. Patient to discharge with COPD protocol with [facility] when appropriate.  Form has been completed by MD

## 2017-09-20 NOTE — Progress Notes (Signed)
Inpatient Diabetes Program Recommendations  AACE/ADA: New Consensus Statement on Inpatient Glycemic Control (2019)  Target Ranges:  Prepandial:   less than 140 mg/dL      Peak postprandial:   less than 180 mg/dL (1-2 hours)      Critically ill patients:  140 - 180 mg/dL   Results for DEBARAH, MCCUMBERS (MRN [id number]) as of 09/20/2017 10:15  Ref. Range 09/19/2017 09:24  Glucose Latest Ref Range: 70 - 99 mg/dL 204 (H)  Results for KHARIS, LAPENNA (MRN [id number]) as of 09/20/2017 10:15  Ref. Range 09/12/2017 03:39  Hemoglobin A1C Latest Ref Range: 4.8 - 5.6 % 6.8 (H)    Review of Glycemic Control  Diabetes history:No Outpatient Diabetes medications:NA Current orders for Inpatient glycemic control:Solumedrol 60 mg Q6H  Inpatient Diabetes Program Recommendations: Insulin-Correction: While inpatient and ordered steroids, please consider ordering CBGs with Novolog 0-9 units TID with meals and Novolog 0-5 units QHS for bedtime correction.  NOTE: Noted A1C on 09/12/17. Patient does not have a history of DM and per ADA, if A1C 6.5% or greater then criteria met to dx with DM. However, notedpatient was an inpatient from 08/27/17-08/29/17 and was received Solumedrol for angioedema.Patient was inpatient from 09/11/17 to 09/15/17 and received steroids and was discharged on Prednisone on 09/15/17.  Patient is currently ordered Solumedrol 60 mg Q6H.Recent steroids arecontributing to elevated A1C and noted hyperglycemia.  Thanks, Barnie Alderman, RN, MSN, CDE Diabetes Coordinator Inpatient Diabetes Program [phone] (Team Pager from 8am to 5pm)

## 2017-09-21 LAB — GLUCOSE, CAPILLARY
GLUCOSE-CAPILLARY: 189 mg/dL — AB (ref 70–99)
Glucose-Capillary: 335 mg/dL — ABNORMAL HIGH (ref 70–99)
Glucose-Capillary: 196 mg/dL — ABNORMAL HIGH (ref 70–99)
Glucose-Capillary: 241 mg/dL — ABNORMAL HIGH (ref 70–99)

## 2017-09-21 LAB — BASIC METABOLIC PANEL
Anion gap: 9 (ref 5–15)
BUN: 44 mg/dL — AB (ref 8–23)
CHLORIDE: 101 mmol/L (ref 98–111)
CO2: 31 mmol/L (ref 22–32)
Calcium: 8.2 mg/dL — ABNORMAL LOW (ref 8.9–10.3)
Creatinine, Ser: 1.94 mg/dL — ABNORMAL HIGH (ref 0.44–1.00)
GFR calc Af Amer: 28 mL/min — ABNORMAL LOW (ref >60)
GFR, EST NON AFRICAN AMERICAN: 24 mL/min — AB (ref >60)
GLUCOSE: 214 mg/dL — AB (ref 70–99)
POTASSIUM: 4.6 mmol/L (ref 3.5–5.1)
Sodium: 141 mmol/L (ref 135–145)

## 2017-09-21 MED ORDER — ENOXAPARIN SODIUM 30 MG/0.3ML ~~LOC~~ SOLN
30.0000 mg | SUBCUTANEOUS | Status: DC
  Administered 2017-09-21: 30 mg via SUBCUTANEOUS
  Filled 2017-09-21: qty 0.3

## 2017-09-21 MED ORDER — MECLIZINE HCL 25 MG PO TABS
25.0000 mg | ORAL_TABLET | Freq: Three times a day (TID) | ORAL | Status: DC | PRN
  Filled 2017-09-21: qty 1

## 2017-09-21 MED ORDER — PREDNISONE 50 MG PO TABS
50.0000 mg | ORAL_TABLET | Freq: Every day | ORAL | Status: DC
  Administered 2017-09-22: 50 mg via ORAL
  Filled 2017-09-21: qty 1

## 2017-09-21 MED ORDER — INSULIN ASPART 100 UNIT/ML ~~LOC~~ SOLN
3.0000 [IU] | Freq: Three times a day (TID) | SUBCUTANEOUS | Status: DC
  Administered 2017-09-21 – 2017-09-22 (×2): 3 [IU] via SUBCUTANEOUS
  Filled 2017-09-21 (×2): qty 1

## 2017-09-21 NOTE — Progress Notes (Signed)
[facility] at [place] NAME: Katherine [name]    MR#:  [id number]  DATE OF BIRTH:  1940/10/17  SUBJECTIVE:  CHIEF COMPLAINT: Shortness of breath is better than yesterday, reports dizziness, generalized weakness, denies wheezing today  REVIEW OF SYSTEMS:  CONSTITUTIONAL: No fever, fatigue or weakness.  EYES: No blurred or double vision.  EARS, NOSE, AND THROAT: No tinnitus or ear pain.  RESPIRATORY: Some cough, improving shortness of breath, denies wheezing or hemoptysis.  CARDIOVASCULAR: No chest pain, orthopnea, edema.  GASTROINTESTINAL: No nausea, vomiting, diarrhea or abdominal pain.  GENITOURINARY: No dysuria, hematuria.  ENDOCRINE: No polyuria, nocturia,  HEMATOLOGY: No anemia, easy bruising or bleeding SKIN: No rash or lesion. MUSCULOSKELETAL: No joint pain or arthritis.   NEUROLOGIC: No tingling, numbness, weakness.  PSYCHIATRY: No anxiety or depression.   DRUG ALLERGIES:   Allergies  Allergen Reactions  . Bee Venom Swelling  . Enalapril Maleate Swelling    VITALS:  Blood pressure 134/85, pulse 87, temperature 98.1 F (36.7 C), temperature source Oral, resp. rate 20, height 5\' 2"  (1.575 m), weight 123.1 kg, SpO2 98 %.  PHYSICAL EXAMINATION:  GENERAL:  77 y.o.-year-old patient lying in the bed with no acute distress.  EYES: Pupils equal, round, reactive to light and accommodation. No scleral icterus. Extraocular muscles intact.  HEENT: Head atraumatic, normocephalic. Oropharynx and nasopharynx clear.  NECK:  Supple, no jugular venous distention. No thyroid enlargement, no tenderness.  LUNGS: Moderate breath sounds bilaterally, no wheezing, rales,rhonchi or crepitation. No use of accessory muscles of respiration.  CARDIOVASCULAR: S1, S2 normal. No murmurs, rubs, or gallops.  ABDOMEN: Soft, nontender, nondistended. Bowel sounds present.   EXTREMITIES: No pedal edema, cyanosis, or clubbing.  NEUROLOGIC: Cranial nerves II  through XII are intact. Muscle strength 5/5 in all extremities. Sensation intact. Gait not checked.  PSYCHIATRIC: The patient is alert and oriented x 3.  SKIN: No obvious rash, lesion, or ulcer.    LABORATORY PANEL:   CBC Recent Labs  Lab 09/19/17 0924  WBC 9.4  HGB 11.1*  HCT 33.8*  PLT 189   ------------------------------------------------------------------------------------------------------------------  Chemistries  Recent Labs  Lab 09/21/17 0706  NA 141  K 4.6  CL 101  CO2 31  GLUCOSE 214*  BUN 44*  CREATININE 1.94*  CALCIUM 8.2*   ------------------------------------------------------------------------------------------------------------------  Cardiac Enzymes Recent Labs  Lab 09/19/17 0924  TROPONINI <0.03   ------------------------------------------------------------------------------------------------------------------  RADIOLOGY:  No results found.  EKG:   Orders placed or performed during the hospital encounter of 09/19/17  . EKG 12-Lead  . EKG 12-Lead  . ED EKG  . ED EKG  . EKG    ASSESSMENT AND PLAN:   *Acute on chronic hypoxic respiratory failure due to very mild COPD exacerbation Clinically getting better.  Wean off oxygen as tolerated to baseline 2 L of oxygen  *Dizziness Check orthostatics PT consult Antivert Carotid Dopplers in May 2019 with no hemodynamically significant stenosis minimal heterogeneous plaque Echocardiogram in May 2019 with 60 to 65% ejection fraction wall motion was normal.  No regional wall motion abnormalities.  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 Management plans discussed with the patient, family and they are in agreement.  CODE STATUS: fc  TOTAL TIME TAKING CARE OF THIS PATIENT: 35 minutes.   POSSIBLE D/C IN  DAYS, DEPENDING ON CLINICAL CONDITION.  Note: This dictation was prepared with Dragon dictation along with smaller phrase technology. Any transcriptional errors that result from this process are unintentional.   Nicholes Mango M.D on 09/21/2017 at 4:32 PM  Between 7am to 6pm - Pager - [phone] After 6pm go to [email or web address] - password [email or web address] [facility]  Office  [phone]  CC: Primary care physician; Valerie Roys, DO

## 2017-09-21 NOTE — Progress Notes (Signed)
Inpatient Diabetes Program Recommendations  AACE/ADA: New Consensus Statement on Inpatient Glycemic Control (2015)  Target Ranges:  Prepandial:   less than 140 mg/dL      Peak postprandial:   less than 180 mg/dL (1-2 hours)      Critically ill patients:  140 - 180 mg/dL   Results for Katherine [name], Katherine [name] (MRN [id number]) as of 09/21/2017 12:05  Ref. Range 09/20/2017 11:36 09/20/2017 16:34 09/20/2017 21:17  Glucose-Capillary Latest Ref Range: 70 - 99 mg/dL 375 (H)  9 units NOVOLOG 235 (H)  3 units NOVOLOG 250 (H)  2 units NOVOLOG   Results for Katherine [name], Katherine [name] (MRN [id number]) as of 09/21/2017 12:05  Ref. Range 09/21/2017 07:20 09/21/2017 11:34  Glucose-Capillary Latest Ref Range: 70 - 99 mg/dL 189 (H)  2 units NOVOLOG 335 (H)  7 units NOVOLOG     Current Orders: Novolog Sensitive Correction Scale/ SSI (0-9 units) TID AC + HS    Noted A1Con 09/12/17. Patient does not have a history of DM and per ADA, if A1C 6.5% or greater then criteria met to dx with DM. However, notedpatient was an inpatient from 08/27/17-08/29/17 and was received Solumedrol for angioedema.Patient was inpatient from 09/11/17 to 09/15/17 and received steroids and was discharged on Prednisone on 09/15/17.Patient is currently ordered Solumedrol40 mg BID.Recent steroids arecontributing to elevated A1Cand noted hyperglycemia.      MD- Please consider adding Novolog Meal Coverage to current Inpatient regimen while patient getting IV steroids:  Novolog 4 units TID with meals   (Please add the following Hold Parameters: Hold if pt eats <50% of meal, Hold if pt NPO)    --Will follow patient during hospitalization--  Wyn Quaker RN, MSN, CDE Diabetes Coordinator Inpatient Glycemic Control Team Team Pager: [phone] (8a-5p)

## 2017-09-22 ENCOUNTER — Other Ambulatory Visit: Payer: Self-pay | Admitting: Family Medicine

## 2017-09-22 LAB — GLUCOSE, CAPILLARY
Glucose-Capillary: 86 mg/dL (ref 70–99)
Glucose-Capillary: 212 mg/dL — ABNORMAL HIGH (ref 70–99)

## 2017-09-22 MED ORDER — ALPRAZOLAM 0.5 MG PO TABS: 0.5000 mg | ORAL_TABLET | Freq: Two times a day (BID) | ORAL | 0 refills | Status: DC | PRN

## 2017-09-22 MED ORDER — DOCUSATE SODIUM 100 MG PO CAPS: 100.0000 mg | ORAL_CAPSULE | Freq: Every day | ORAL | 0 refills | Status: DC

## 2017-09-22 MED ORDER — PREDNISONE 10 MG (21) PO TBPK: 10.0000 mg | ORAL_TABLET | Freq: Every day | ORAL | 0 refills | Status: DC

## 2017-09-22 MED ORDER — MECLIZINE HCL 25 MG PO TABS: 25.0000 mg | ORAL_TABLET | Freq: Two times a day (BID) | ORAL | 0 refills | Status: DC | PRN

## 2017-09-22 NOTE — Discharge Summary (Addendum)
[facility] NAME: Katherine [name]    MR#:  [id number]  DATE OF BIRTH:  Dec 16, 1940  DATE OF ADMISSION:  09/19/2017 ADMITTING PHYSICIAN: Avel Peace Salary, MD  DATE OF DISCHARGE:  09/22/17   PRIMARY CARE PHYSICIAN: Valerie Roys, DO    ADMISSION DIAGNOSIS:  Chronic obstructive pulmonary disease, unspecified COPD type ([place]) [J44.9] Acute renal failure superimposed on chronic kidney disease, unspecified CKD stage, unspecified acute renal failure type (HCC) [N17.9, N18.9] Congestive heart failure, unspecified HF chronicity, unspecified heart failure type ([place]) [I50.9]  DISCHARGE DIAGNOSIS:  Active Problems:   COPD (chronic obstructive pulmonary disease) (HCC)   COPD exacerbation (HCC)   SECONDARY DIAGNOSIS:   Past Medical History:  Diagnosis Date  . Angioedema   . Anxiety   . Anxiety and depression   . Chronic constipation   . Chronic kidney disease    stage 3  . COPD (chronic obstructive pulmonary disease) ([place])   . Gallstones   . GERD (gastroesophageal reflux disease)   . Hypertension   . Left ventricular hypertrophy   . Osteoarthritis   . Prurigo nodularis   . Tobacco abuse   . Vitamin D deficiency disease     HOSPITAL COURSE:  HISTORY OF PRESENT ILLNESS: Katherine [name]  is a 77 y.o. female with a known history per below which includes multiple frequent hospital admissions for COPD exacerbation-last discharge 3 days ago, on chronic O2 at home, presents to the emergency room with acute shortness of breath that started early this morning, in the emergency room patient was noted to be satting 8889%, placed on BiPAP, patient evaluated in the emergency room, no apparent distress, husband at the bedside, patient uncooperative for physical examination, ER work-up largely unimpressive, chest x-ray noted for mild CHF-thought to be due to overcall given morbid obesity, patient is now being admitted for acute on chronic  hypoxic respiratory failure due to very mild COPD exacerbation  *Acute on chronic hypoxic respiratory failure due to very mild COPD exacerbation Clinically getting better.  Weaned off oxygen as tolerated to baseline 2 L of oxygen  *Dizziness Negative orthostatics PT consult-Home health PT Antivert as needed Carotid Dopplers in May 2019 with no hemodynamically significant stenosis minimal heterogeneous plaque Echocardiogram in May 2019 with 60 to 65% ejection fraction wall motion was normal.  No regional wall motion abnormalities.  Aortic valve was normal  *Acute on COPD exacerbation-frequent admissions for this diagnosis 11 admissions in the past 6 months Consult placed to pulmonology and discussed with intensivist Dr. Hoyle Sauer Recommending outpatient follow-up with patient's primary pulmonologist Dr. Alva Garnet Discussed with with patient's spouse Mr. Katherine [name], he is reporting that patient is compliant with her medications Continue Solu-Medrol and wean off to p.o. prednisone Bronchodilator treatments COPD GOLD protocol  * Hyperkalemia kayexalate given potassium is back to normal  *Chronic snoring Outpatient follow-up with pulmonology and pulmonary function test to rule out obstructive sleep apnea  *Chronic extreme morbid obesity Lifestyle modifications advised  *Chronic kidney disease stage III Avoid nephrotoxins and monitor renal function closely  *Chronic GERD without esophagitis PPI  DVT prophylaxis with heparin subcu  Patient is not considering palliative care at this time DISCHARGE CONDITIONS:   fair  CONSULTS OBTAINED:  Treatment Team:  [name], MD   PROCEDURES  None   DRUG ALLERGIES:   Allergies  Allergen Reactions  . Bee Venom Swelling  . Enalapril Maleate Swelling    DISCHARGE MEDICATIONS:   Allergies as  of 09/22/2017      Reactions   Bee Venom Swelling   Enalapril Maleate Swelling      Medication List    STOP taking these  medications   predniSONE 10 MG tablet Commonly known as:  DELTASONE Replaced by:  predniSONE 10 MG (21) Tbpk tablet     TAKE these medications   acetaminophen 650 MG CR tablet Commonly known as:  TYLENOL Take 650-1,300 mg by mouth every 8 (eight) hours as needed for pain.   albuterol 108 (90 Base) MCG/ACT inhaler Commonly known as:  PROVENTIL HFA;VENTOLIN HFA Inhale 2 puffs into the lungs every 4 (four) hours as needed for wheezing or shortness of breath.   albuterol 0.63 MG/3ML nebulizer solution Commonly known as:  ACCUNEB Take 3 mLs (0.63 mg total) by nebulization 3 (three) times daily as needed for wheezing. DX:J44.9   ALPRAZolam 0.5 MG tablet Commonly known as:  XANAX Take 1 tablet (0.5 mg total) by mouth 2 (two) times daily as needed for up to 15 doses for anxiety.   atorvastatin 80 MG tablet Commonly known as:  LIPITOR Take 80 mg by mouth daily.   cetirizine 10 MG tablet Commonly known as:  ZYRTEC TAKE 1/2 TABLET(5 MG) BY MOUTH AT BEDTIME What changed:  See the new instructions.   cholecalciferol 1000 units tablet Commonly known as:  VITAMIN D Take 1,000 Units by mouth daily.   clopidogrel 75 MG tablet Commonly known as:  PLAVIX Take 1 tablet (75 mg total) by mouth daily.   diltiazem 120 MG 24 hr capsule Commonly known as:  CARDIZEM CD Take 120 mg by mouth daily.   docusate sodium 100 MG capsule Commonly known as:  COLACE Take 1 capsule (100 mg total) by mouth daily.   FLUoxetine 10 MG capsule Commonly known as:  PROZAC TAKE 1 CAPSULE(10 MG) BY MOUTH DAILY   Fluticasone-Umeclidin-Vilant 100-62.5-25 MCG/INH Aepb Inhale 1 puff into the lungs daily.   gabapentin 300 MG capsule Commonly known as:  NEURONTIN Take 1 capsule (300 mg total) by mouth at bedtime.   hydrALAZINE 25 MG tablet Commonly known as:  APRESOLINE Take 1 tablet (25 mg total) by mouth 3 (three) times daily.   ipratropium 0.03 % nasal spray Commonly known as:  ATROVENT Place 2 sprays  into both nostrils every 12 (twelve) hours.   ipratropium-albuterol 0.5-2.5 (3) MG/3ML Soln Commonly known as:  DUONEB USE 1 VIAL VIA NEBULIZER EVERY 6 HOURS AS NEEDED   meclizine 25 MG tablet Commonly known as:  ANTIVERT Take 1 tablet (25 mg total) by mouth 2 (two) times daily as needed for dizziness.   methocarbamol 500 MG tablet Commonly known as:  ROBAXIN Take 1 tablet (500MG ) by mouth every 8 hours as needed for muscle spasms   pantoprazole 40 MG tablet Commonly known as:  PROTONIX Take 1 tablet (40 mg total) by mouth daily.   predniSONE 10 MG (21) Tbpk tablet Commonly known as:  STERAPRED UNI-PAK 21 TAB Take 1 tablet (10 mg total) by mouth daily. Take 6 tablets by mouth for 1 day followed by  5 tablets by mouth for 1 day followed by  4 tablets by mouth for 1 day followed by  3 tablets by mouth for 1 day followed by  2 tablets by mouth for 1 day followed by  1 tablet by mouth for a day and stop Replaces:  predniSONE 10 MG tablet   promethazine 12.5 MG tablet Commonly known as:  PHENERGAN Take 12.5 mg by mouth  every 6 (six) hours as needed for nausea or vomiting.   senna 8.6 MG tablet Commonly known as:  SENOKOT Take 2 tablets by mouth at bedtime as needed for constipation.   tiotropium 18 MCG inhalation capsule Commonly known as:  SPIRIVA Place 18 mcg into inhaler and inhale daily.        DISCHARGE INSTRUCTIONS:   Follow-up with primary care physician in 2 to 3 days Continue 2 L of oxygen via nasal cannula Home health PT, RN, respiratory therapy Follow-up with pulmonology Dr. Jamal Collin in 4 to 5 days Outpatient follow-up with pulmonology rehab in 2 to 3 days   DIET:  Cardiac diet  DISCHARGE CONDITION:  Fair  ACTIVITY:  Activity as tolerated  OXYGEN:  Home Oxygen: Yes.     Oxygen Delivery: 2 liters/min via Patient connected to nasal cannula oxygen  DISCHARGE LOCATION:  home   If you experience worsening of your admission symptoms, develop  shortness of breath, life threatening emergency, suicidal or homicidal thoughts you must seek medical attention immediately by calling 911 or calling your MD immediately  if symptoms less severe.  You Must read complete instructions/literature along with all the possible adverse reactions/side effects for all the Medicines you take and that have been prescribed to you. Take any new Medicines after you have completely understood and accpet all the possible adverse reactions/side effects.   Please note  You were cared for by a hospitalist during your hospital stay. If you have any questions about your discharge medications or the care you received while you were in the hospital after you are discharged, you can call the unit and asked to speak with the hospitalist on call if the hospitalist that took care of you is not available. Once you are discharged, your primary care physician will handle any further medical issues. Please note that NO REFILLS for any discharge medications will be authorized once you are discharged, as it is imperative that you return to your primary care physician (or establish a relationship with a primary care physician if you do not have one) for your aftercare needs so that they can reassess your need for medications and monitor your lab values.     Today  Chief Complaint  Patient presents with  . Respiratory Arrest   Patient is feeling better.  Dizziness resolved.  Denies any shortness of breath.  Wants to go home.  Discussed with patient's husband Katherine [name], reporting that patient is compliant with her medications. Discussed with pulmonology Dr. Bari Mantis, recommending outpatient follow-up with Dr. Alva Garnet ASAP  ROS:  CONSTITUTIONAL: Denies fevers, chills. Denies any fatigue, weakness.  EYES: Denies blurry vision, double vision, eye pain. EARS, NOSE, THROAT: Denies tinnitus, ear pain, hearing loss. RESPIRATORY: Denies cough, wheeze, shortness of breath.   CARDIOVASCULAR: Denies chest pain, palpitations, edema.  GASTROINTESTINAL: Denies nausea, vomiting, diarrhea, abdominal pain. Denies bright red blood per rectum. GENITOURINARY: Denies dysuria, hematuria. ENDOCRINE: Denies nocturia or thyroid problems. HEMATOLOGIC AND LYMPHATIC: Denies easy bruising or bleeding. SKIN: Denies rash or lesion. MUSCULOSKELETAL: Denies pain in neck, back, shoulder, knees, hips or arthritic symptoms.  NEUROLOGIC: Denies paralysis, paresthesias.  PSYCHIATRIC: Denies anxiety or depressive symptoms.   VITAL SIGNS:  Blood pressure 131/80, pulse 79, temperature 98.4 F (36.9 C), temperature source Oral, resp. rate 20, height 5\' 2"  (1.575 m), weight 123.1 kg, SpO2 99 %.  I/O:    Intake/Output Summary (Last 24 hours) at 09/22/2017 1144 Last data filed at 09/22/2017 0527 Gross per 24 hour  Intake  240 ml  Output 700 ml  Net -460 ml    PHYSICAL EXAMINATION:  GENERAL:  77 y.o.-year-old patient lying in the bed with no acute distress.  EYES: Pupils equal, round, reactive to light and accommodation. No scleral icterus. Extraocular muscles intact.  HEENT: Head atraumatic, normocephalic. Oropharynx and nasopharynx clear.  NECK:  Supple, no jugular venous distention. No thyroid enlargement, no tenderness.  LUNGS: Normal breath sounds bilaterally, no wheezing, rales,rhonchi or crepitation. No use of accessory muscles of respiration.  CARDIOVASCULAR: S1, S2 normal. No murmurs, rubs, or gallops.  ABDOMEN: Soft, non-tender, non-distended. Bowel sounds present. No organomegaly or mass.  EXTREMITIES: No pedal edema, cyanosis, or clubbing.  NEUROLOGIC: Cranial nerves II through XII are intact. Muscle strength 5/5 in all extremities. Sensation intact. Gait not checked.  PSYCHIATRIC: The patient is alert and oriented x 3.  SKIN: No obvious rash, lesion, or ulcer.   DATA REVIEW:   CBC Recent Labs  Lab 09/19/17 0924  WBC 9.4  HGB 11.1*  HCT 33.8*  PLT 189     Chemistries  Recent Labs  Lab 09/21/17 0706  NA 141  K 4.6  CL 101  CO2 31  GLUCOSE 214*  BUN 44*  CREATININE 1.94*  CALCIUM 8.2*    Cardiac Enzymes Recent Labs  Lab 09/19/17 0924  TROPONINI <0.03    Microbiology Results  Results for orders placed or performed during the hospital encounter of 09/11/17  Culture, sputum-assessment     Status: None   Collection Time: 09/11/17  8:21 PM  Result Value Ref Range Status   Specimen Description SPUTUM  Final   Special Requests NONE  Final   Sputum evaluation   Final    THIS SPECIMEN IS ACCEPTABLE FOR SPUTUM CULTURE Performed at [facility], [address] [place], [place] [address]    Report Status 09/12/2017 FINAL  Final  Culture, blood (routine x 2) Call MD if unable to obtain prior to antibiotics being given     Status: None   Collection Time: 09/11/17 10:44 PM  Result Value Ref Range Status   Specimen Description BLOOD LEFT HAND  Final   Special Requests   Final    BOTTLES DRAWN AEROBIC AND ANAEROBIC Blood Culture adequate volume   Culture   Final    NO GROWTH 5 DAYS Performed at [facility], [address] [place], [place] [address]    Report Status 09/16/2017 FINAL  Final  Culture, blood (routine x 2) Call MD if unable to obtain prior to antibiotics being given     Status: None   Collection Time: 09/11/17 10:56 PM  Result Value Ref Range Status   Specimen Description BLOOD RIGHT ANTECUBITAL  Final   Special Requests   Final    BOTTLES DRAWN AEROBIC AND ANAEROBIC Blood Culture adequate volume   Culture   Final    NO GROWTH 5 DAYS Performed at [facility], [address], [place], [place] [address]    Report Status 09/16/2017 FINAL  Final  Culture, respiratory     Status: None   Collection Time: 09/12/17 10:34 AM  Result Value Ref Range Status   Specimen Description   Final    SPUTUM Performed at [facility], [address] [place], [place] [address]     Special Requests   Final    NONE Reflexed from [id number] Performed at [facility], [address] [place], [place] [address]    Gram Stain   Final  FEW WBC PRESENT,BOTH PMN AND MONONUCLEAR FEW SQUAMOUS EPITHELIAL CELLS PRESENT MODERATE GRAM POSITIVE COCCI IN PAIRS MODERATE GRAM VARIABLE ROD    Culture   Final    FEW Consistent with normal respiratory flora. Performed at [facility], [place] [address] [place], [place] [address]    Report Status 09/14/2017 FINAL  Final    RADIOLOGY:  Dg Chest 1 View  Result Date: 09/19/2017 CLINICAL DATA:  Acute onset respiratory distress up on walking. EXAM: CHEST  1 VIEW COMPARISON:  September 11, 2017 FINDINGS: The heart size and mediastinal contours are stable. The heart size is enlarged. There is mild diffuse increased pulmonary interstitium bilaterally. Mild atelectasis versus scar of both lung bases are noted. There is no focal pneumonia or pleural effusion. The visualized skeletal structures are unremarkable. IMPRESSION: Mild congestive heart failure. Mild atelectasis versus scar of bilateral lung bases. Electronically Signed   By: Abelardo Diesel M.D.   On: 09/19/2017 09:58    EKG:   Orders placed or performed during the hospital encounter of 09/19/17  . EKG 12-Lead  . EKG 12-Lead  . ED EKG  . ED EKG  . EKG      Management plans discussed with the patient, family and they are in agreement.  CODE STATUS:     Code Status Orders  (From admission, onward)         Start     Ordered   09/19/17 1307  Full code  Continuous     09/19/17 1306        Code Status History    Date Active Date Inactive Code Status Order ID Comments User Context   09/11/2017 2021 09/15/2017 1510 Full Code [id number]  Gorden Harms, MD Inpatient   08/27/2017 1543 08/29/2017 2159 Full Code [id number]  Hillary Bow, MD ED   06/28/2017 1414 06/30/2017 1748 Full Code [id number]  Salary, Avel Peace, MD Inpatient   06/12/2017 0858 06/16/2017 1659 DNR [id number]   Demetrios Loll, MD Inpatient   05/26/2017 1732 05/30/2017 1446 DNR [id number]  Hillary Bow, MD ED   05/17/2017 1228 05/21/2017 1511 DNR [id number]  Asencion Gowda, NP Inpatient   04/21/2017 0117 04/22/2017 1750 DNR [id number]  Lance Coon, MD Inpatient   04/05/2017 1411 04/08/2017 2101 Full Code [id number]  Saundra Shelling, MD Inpatient   02/08/2016 1414 02/11/2016 1729 Full Code [id number]  Hessie Knows, MD Inpatient   11/27/2015 1645 11/29/2015 1628 Full Code [id number]  Idelle Crouch, MD Inpatient   02/10/2015 2122 02/12/2015 1809 Full Code [id number]  Lytle Butte, MD ED   09/16/2014 1220 09/19/2014 1736 Full Code [id number]  Aldean Jewett, MD Inpatient      TOTAL TIME TAKING CARE OF THIS PATIENT: 45  minutes.   Note: This dictation was prepared with Dragon dictation along with smaller phrase technology. Any transcriptional errors that result from this process are unintentional.   @[facility] @  on 09/22/2017 at 11:44 AM  Between 7am to 6pm - Pager - [phone]  After 6pm go to [email or web address] - password [email or web address] [facility]  Office  [phone]  CC: Primary care physician; Valerie Roys, DO

## 2017-09-22 NOTE — Care Management Note (Signed)
Case Management Note  Patient Details  Name: Deshon Koslowski MRN: [id number] Date of Birth: 12/26/1940  Subjective/Objective:   Patient to be discharged per MD order. Orders in place for home health services. Patient previously open with advanced home care. Patient prefers to resume services. Referral placed with [facility] who has PT,RN,OT and aide services. No DME need. Family to transport home.  Ines Bloomer RN BSN RNCM [phone]                   Action/Plan:   Expected Discharge Date:  09/22/17               Expected Discharge Plan:  [place]  In-House Referral:     Discharge planning Services  CM Consult  Post Acute Care Choice:  Home Health, Resumption of Svcs/PTA Provider Choice offered to:  Patient  DME Arranged:    DME Agency:     HH Arranged:  RN, PT, OT, Nurse's Aide [place] Agency:  [place]  Status of Service:  Completed, signed off  If discussed at [place] of Stay Meetings, dates discussed:    Additional Comments:  Latanya Maudlin, RN 09/22/2017, 10:55 AM

## 2017-09-22 NOTE — Discharge Instructions (Signed)
Follow-up with primary care physician in 2 to 3 days Continue 2 L of oxygen via nasal cannula Home health PT, RN, respiratory therapy Follow-up with pulmonology Dr. Jamal Collin in 4 to 5 days Outpatient follow-up with pulmonology rehab in 2 to 3 days

## 2017-09-22 NOTE — Evaluation (Signed)
Physical Therapy Evaluation Patient Details Name: Katherine [name] MRN: [id number] DOB: 09/16/1940 Today's Date: 09/22/2017   History of Present Illness  Patient is a 77 year old female admitted from home for respiratory distress following c/o SOB.  PMH includes CKD, COPD, anxiety, left ventricular hypertrophy and angioedema.  Clinical Impression  Patient is a 77 year old female who lives in a one story home with her husband.  She is independent with use of SPC at baseline but reported recent fall hx, stating that she would like to start using her RW more.  She was very pleasant and A&O during evaluation.  Pt able to perform bed mobility mod I and sit at EOB without assistance.  She presented with generalized weakness of UE and fair strength of LE's with no report of sensation loss.  Pt able to rise from bedside with supervision due to being unsteady on feet.  She was able to demonstrate appropriate use of RW.  Pt did require min A during 40 ft of ambulation for correction of one posterior LOB and management of equipment.  She presented with a gait pattern indicative of fall risk as well.  Pt appeared very SOB following ambulation but vitals remained WNL.  Pt on 3L of O2 throughout evaluation.  Pt will benefit from skilled PT with focus on strength, balance and fall prevention, tolerance to activity and safe use of RW.    Follow Up Recommendations Home health PT;Supervision for mobility/OOB(Patient would benefit from pulmonary rehabilitation as well.)    Equipment Recommendations  None recommended by PT    Recommendations for Other Services       Precautions / Restrictions Precautions Precautions: Fall Precaution Comments: High fall risk-report of multiple fall hx. Restrictions Weight Bearing Restrictions: No      Mobility  Bed Mobility Overal bed mobility: Modified Independent Bed Mobility: Supine to Sit     Supine to sit: Modified independent (Device/Increase time)     General bed  mobility comments: Uses bed rail and HOB elevated. No assistance needed from PT to sit upright.  Transfers Overall transfer level: Needs assistance Equipment used: Rolling walker (2 wheeled) Transfers: Sit to/from Stand Sit to Stand: Supervision         General transfer comment: Supervision required due to to pt appearing very weak and slighlty unsteady on feet.  Uses RW appropriately and no need for physical assist to rise.  Ambulation/Gait Ambulation/Gait assistance: Min assist Gait Distance (Feet): 50 Feet Assistive device: Rolling walker (2 wheeled)     Gait velocity interpretation: 1.31 - 2.62 ft/sec, indicative of limited community ambulator General Gait Details: Assistance required for navigation of obstacles and correction of one posterior LOB.  Pt stated that she is better able to turn to her left side and PT provided assistance with management of equiptment.  Low to moderate foot clearance, decreased step length and slightly flexed posture noted.  PT provided education concerning sequencing turns and household navigation and pt responded appropriately.  Appeared very SOB following ambulation.  Vitals remained WNL.  Stairs            Wheelchair Mobility    Modified Rankin (Stroke Patients Only)       Balance                                             Pertinent Vitals/Pain Pain Assessment: No/denies pain  Home Living Family/patient expects to be discharged to:: Private residence Living Arrangements: Spouse/significant other Available Help at Discharge: Family;Available 24 hours/day(Daughter available during day.) Type of Home: House Home Access: Level entry     Home Layout: One level Home Equipment: Walker - 2 wheels;Cane - single point      Prior Function Level of Independence: Independent with assistive device(s)   Gait / Transfers Assistance Needed: Walking mostly household distances with SPC.  States that she needs to start  using RW due to fall hx.  ADL's / Homemaking Assistance Needed: Husband assists some with bathing        Hand Dominance        Extremity/Trunk Assessment   Upper Extremity Assessment Upper Extremity Assessment: Generalized weakness    Lower Extremity Assessment Lower Extremity Assessment: Overall WFL for tasks assessed(No sensation loss noted.)    Cervical / Trunk Assessment Cervical / Trunk Assessment: Kyphotic  Communication   Communication: HOH  Cognition Arousal/Alertness: Awake/alert Behavior During Therapy: WFL for tasks assessed/performed Overall Cognitive Status: Within Functional Limits for tasks assessed                                 General Comments: A&O x4.  Follows commands consistently.      General Comments      Exercises     Assessment/Plan    PT Assessment Patient needs continued PT services  PT Problem List Decreased strength;Decreased mobility;Decreased balance;Decreased knowledge of use of DME;Decreased activity tolerance       PT Treatment Interventions DME instruction;Therapeutic activities;Gait training;Therapeutic exercise;Balance training;Functional mobility training;Patient/family education    PT Goals (Current goals can be found in the Care Plan section)  Acute Rehab PT Goals Patient Stated Goal: To continue with household and limited community ambulation with her husband as much as possible. PT Goal Formulation: With patient Time For Goal Achievement: 10/06/17 Potential to Achieve Goals: Good    Frequency Min 2X/week   Barriers to discharge        Co-evaluation               AM-PAC PT "6 Clicks" Daily Activity  Outcome Measure Difficulty turning over in bed (including adjusting bedclothes, sheets and blankets)?: A Little Difficulty moving from lying on back to sitting on the side of the bed? : A Little Difficulty sitting down on and standing up from a chair with arms (e.g., wheelchair, bedside  commode, etc,.)?: A Little Help needed moving to and from a bed to chair (including a wheelchair)?: A Little Help needed walking in hospital room?: A Little Help needed climbing 3-5 steps with a railing? : A Little 6 Click Score: 18    End of Session Equipment Utilized During Treatment: Gait belt;Oxygen Activity Tolerance: Patient limited by fatigue Patient left: with call bell/phone within reach;in chair;with chair alarm set Nurse Communication: Mobility status PT Visit Diagnosis: Unsteadiness on feet (R26.81);History of falling (Z91.81);Muscle weakness (generalized) (M62.81)    Time: 2423-5361 PT Time Calculation (min) (ACUTE ONLY): 25 min   Charges:   PT Evaluation $PT Eval Low Complexity: 1 Low PT Treatments $Therapeutic Activity: 8-22 mins        Roxanne Gates, PT, DPT  Roxanne Gates 09/22/2017, 9:29 AM

## 2017-09-22 NOTE — Progress Notes (Signed)
Pt discharged per MD order. IV removed. Discharge instructions reviewed with pt. Prescriptions given to pt. Pt verbalized understanding of instructions and all questions answered to pt satisfaction. Pt taken to car in wheelchair by staff.

## 2017-09-24 ENCOUNTER — Telehealth: Payer: Self-pay

## 2017-09-24 ENCOUNTER — Other Ambulatory Visit: Payer: Self-pay

## 2017-09-24 NOTE — Telephone Encounter (Signed)
Transition Care Management Follow-up Telephone Call  Spoke with Bennie Hind   How have you been since you were released from the hospital? "shes doing pretty well this morning "  Do you understand why you were in the hospital? yes  Do you have a copy of your discharge instructions Yes Do you understand the discharge instrcutions? yes  Where were you discharged to? Home  Do you have support at home? Yes    Items Reviewed:  Medications obtained Yes  Medications reviewed: Yes  Dietary changes reviewed: yes  Home Health? No  DME ordered at discharge obtained? NA  Medical supplies: NA, on oxygen at home    Functional Questionnaire:   Activities of Daily Living (ADLs):   She states they are independent in the following: bathing and hygiene, feeding, continence, grooming, toileting, dressing and medication management States they require assistance with the following: ambulation- with walker  Any transportation issues/concerns?: no  Any patient concerns? no  Confirmed importance and date/time of follow-up visits scheduled with PCP: yes Dr.Johnson on 09/26/2017 at 11:15am.   Confirm appointment scheduled with specialist? Yes  Confirmed with patient if condition begins to worsen call PCP or If it's emergency go to the ER.

## 2017-09-24 NOTE — Care Management (Signed)
Post discharge note entry: Received notification from [place] [facility] that patient has been declining home health visits. RNCM called and spoke with patient's husband and he said that home health RN was there today and that "patient/she is doing good". He agrees to [facility] referral and would like more information.

## 2017-09-24 NOTE — Patient Outreach (Signed)
[facility]) Care Management  09/24/2017  Katherine [name] 07/14/1940 [id number]  Care Coordination: Successful telephone encounter to the above patient's husband, Katherine [name], indicated as primary contact on new referral received today for [facility] CM services from Marshell Garfinkel, [place].   Patient and spouse are well know by RN CM and familiar with [place] CM services. Per referral, spouse called and re-requested RN CM services post discharge from [facility] 09/22/17.   [facility] RN CM re-introduced role of [facility] Community Case Management. RN CM also confirmed with spouse that patient is no longer receiving primary care with [facility]. Spouse states "she never wanted to go over there. That was her son and daughters doings. She is back now with Dr. Wynetta Emery". RN CM clarified that patient's PCP was Park Liter, DO with [facility]. Verbal consent for [facility] CM services obtained from spouse related to patient not being present at time of phone conversation.  PCP completes transition of care calls. Spouse acknowledges having all discharge medications, discharge papers and follow up appointment with PCP 8/14. Spouse denies needs for additional community services related to transportation needs, housing, or food insecurities. Denies new or recent falls. [facility] HH RN completed initial home visit post hospitalization discharge today.  Spouse does request that son be removed from [facility] written consent. Will not contact son with any unforseen needs until can clarify request with patient.   Plan: Initial home visit next week. Will send notification to PCP regarding [facility] RN CM involvement.  Yifan Auker E. Rollene Rotunda RN, BSN [facility] Care Management Coordinator [phone]

## 2017-09-25 ENCOUNTER — Ambulatory Visit: Payer: Medicare HMO | Admitting: Unknown Physician Specialty

## 2017-09-26 ENCOUNTER — Ambulatory Visit (INDEPENDENT_AMBULATORY_CARE_PROVIDER_SITE_OTHER): Payer: Medicare HMO | Admitting: Family Medicine

## 2017-09-26 ENCOUNTER — Encounter: Payer: Self-pay | Admitting: Family Medicine

## 2017-09-26 ENCOUNTER — Other Ambulatory Visit: Payer: Self-pay

## 2017-09-26 VITALS — BP 139/84 | HR 92 | Temp 98.9°F | Ht 60.0 in | Wt 274.0 lb

## 2017-09-26 DIAGNOSIS — J449 Chronic obstructive pulmonary disease, unspecified: Secondary | ICD-10-CM

## 2017-09-26 NOTE — Progress Notes (Signed)
BP 139/84   Pulse 92   Temp 98.9 F (37.2 C) (Oral)   Ht 5' (1.524 m)   Wt 274 lb (124.3 kg)   SpO2 (!) 88%   BMI 53.51 kg/m    Subjective:    Patient ID: Katherine [name], female    DOB: [birth year], 77 y.o.   MRN: [id number]  HPI: Katherine [name] is a 77 y.o. female  Chief Complaint  Patient presents with  . Hospitalization Follow-up  . COPD   Katherine [name] presents today for hospital follow up. She is very lethargic right now. SO states that she just took her medicine and it makes her sleepy. She notes that her breathing has been doing OK. She states that she has been taking her medicine like she is supposed to. She has also been taking her inhalers the way she's supposed to. She does not have an appointment with pulmonology because she has been in the hospital too much. She states that her breathing has been doing well. No other concerns.   Transition of Care Hospital Follow up.   Hospital/Facility: [facility] D/C Physician: Dr. Margaretmary Eddy D/C Date: 09/22/17  Records Requested: 09/22/17 Records Received: 09/22/17 Records Reviewed: 09/22/17  Diagnoses on Discharge: COPD exacerbation  Date of interactive Contact within 48 hours of discharge: 09/24/17 Contact was through: phone  Date of 7 day or 14 day face-to-face visit:  09/26/17  within 7 days  Outpatient Encounter Medications as of 09/26/2017  Medication Sig  . acetaminophen (TYLENOL) 650 MG CR tablet Take 650-1,300 mg by mouth every 8 (eight) hours as needed for pain.   [name] albuterol (ACCUNEB) 0.63 MG/3ML nebulizer solution Take 3 mLs (0.63 mg total) by nebulization 3 (three) times daily as needed for wheezing. DX:J44.9  . albuterol (PROVENTIL HFA) 108 (90 Base) MCG/ACT inhaler Inhale 2 puffs into the lungs every 4 (four) hours as needed for wheezing or shortness of breath.  . ALPRAZolam (XANAX) 0.5 MG tablet Take 1 tablet (0.5 mg total) by mouth 2 (two) times daily as needed for up to 15 doses for anxiety.  [name] atorvastatin (LIPITOR) 80 MG  tablet Take 80 mg by mouth daily.  . cetirizine (ZYRTEC) 10 MG tablet TAKE 1/2 TABLET(5 MG) BY MOUTH AT BEDTIME  . cholecalciferol (VITAMIN D) 1000 units tablet Take 1,000 Units by mouth daily.  . clopidogrel (PLAVIX) 75 MG tablet Take 1 tablet (75 mg total) by mouth daily.  [name] diltiazem (CARDIZEM CD) 120 MG 24 hr capsule Take 120 mg by mouth daily.  [name] docusate sodium (COLACE) 100 MG capsule Take 1 capsule (100 mg total) by mouth daily.  [name] FLUoxetine (PROZAC) 10 MG capsule TAKE 1 CAPSULE(10 MG) BY MOUTH DAILY  . Fluticasone-Umeclidin-Vilant (TRELEGY ELLIPTA) 100-62.5-25 MCG/INH AEPB Inhale 1 puff into the lungs daily.  [name] gabapentin (NEURONTIN) 300 MG capsule Take 1 capsule (300 mg total) by mouth at bedtime.  . hydrALAZINE (APRESOLINE) 25 MG tablet Take 1 tablet (25 mg total) by mouth 3 (three) times daily.  [name] ipratropium (ATROVENT) 0.03 % nasal spray Place 2 sprays into both nostrils every 12 (twelve) hours.  [name] ipratropium-albuterol (DUONEB) 0.5-2.5 (3) MG/3ML SOLN USE 1 VIAL VIA NEBULIZER EVERY 6 HOURS AS NEEDED  . meclizine (ANTIVERT) 25 MG tablet Take 1 tablet (25 mg total) by mouth 2 (two) times daily as needed for dizziness.  . methocarbamol (ROBAXIN) 500 MG tablet Take 1 tablet (500MG ) by mouth every 8 hours as needed for muscle spasms  . pantoprazole (PROTONIX) 40 MG tablet Take  1 tablet (40 mg total) by mouth daily.  . predniSONE (STERAPRED UNI-PAK 21 TAB) 10 MG (21) TBPK tablet Take 1 tablet (10 mg total) by mouth daily. Take 6 tablets by mouth for 1 day followed by  5 tablets by mouth for 1 day followed by  4 tablets by mouth for 1 day followed by  3 tablets by mouth for 1 day followed by  2 tablets by mouth for 1 day followed by  1 tablet by mouth for a day and stop  . promethazine (PHENERGAN) 12.5 MG tablet Take 12.5 mg by mouth every 6 (six) hours as needed for nausea or vomiting.   . senna (SENOKOT) 8.6 MG tablet Take 2 tablets by mouth at bedtime as needed for constipation.   [name]  tiotropium (SPIRIVA) 18 MCG inhalation capsule Place 18 mcg into inhaler and inhale daily.   No facility-administered encounter medications on file as of 09/26/2017.     Diagnostic Tests Reviewed:  CLINICAL DATA:  Acute onset respiratory distress up on walking.  EXAM: CHEST  1 VIEW  COMPARISON:  September 11, 2017  FINDINGS: The heart size and mediastinal contours are stable. The heart size is enlarged. There is mild diffuse increased pulmonary interstitium bilaterally. Mild atelectasis versus scar of both lung bases are noted. There is no focal pneumonia or pleural effusion. The visualized skeletal structures are unremarkable.  IMPRESSION: Mild congestive heart failure.  Mild atelectasis versus scar of bilateral lung bases.  Disposition: Home with Home Health  Consults: Pulmonology  Discharge Instructions: Follow up here and with Pulmology  Disease/illness Education: Given today  Home Health/Community Services Discussions/Referrals:  Up to date  Establishment or re-establishment of referral orders for community resources: up to date  Discussion with other health care providers: N/A  Assessment and Support of treatment regimen adherence: Good  Appointments Coordinated with: Patient  Education for self-management, independent living, and ADLs: Given today  Relevant past medical, surgical, family and social history reviewed and updated as indicated. Interim medical history since our last visit reviewed. Allergies and medications reviewed and updated.  Review of Systems  Constitutional: Negative.   Respiratory: Positive for shortness of breath. Negative for apnea, cough, choking, chest tightness, wheezing and stridor.   Cardiovascular: Negative.   Neurological: Negative.   Psychiatric/Behavioral: Negative.     Per HPI unless specifically indicated above     Objective:    BP 139/84   Pulse 92   Temp 98.9 F (37.2 C) (Oral)   Ht 5' (1.524 m)   Wt 274 lb  (124.3 kg)   SpO2 (!) 88%   BMI 53.51 kg/m   Wt Readings from Last 3 Encounters:  09/26/17 274 lb (124.3 kg)  09/19/17 271 lb 6.2 oz (123.1 kg)  09/11/17 269 lb (122 kg)    Physical Exam  Constitutional: She is oriented to person, place, and time. She appears well-developed and well-nourished. No distress.  HENT:  Head: Normocephalic and atraumatic.  Right Ear: Hearing normal.  Left Ear: Hearing normal.  Nose: Nose normal.  Eyes: Conjunctivae and lids are normal. Right eye exhibits no discharge. Left eye exhibits no discharge. No scleral icterus.  Cardiovascular: Normal rate, regular rhythm, normal heart sounds and intact distal pulses. Exam reveals no gallop and no friction rub.  No murmur heard. Pulmonary/Chest: Effort normal and breath sounds normal. No stridor. No respiratory distress. She has no wheezes. She has no rales. She exhibits no tenderness.  Musculoskeletal: Normal range of motion.  Neurological: She is alert  and oriented to person, place, and time.  Skin: Skin is warm, dry and intact. Capillary refill takes less than 2 seconds. No rash noted. She is not diaphoretic. No erythema. No pallor.  Psychiatric: She has a normal mood and affect. Her speech is normal and behavior is normal. Judgment and thought content normal. Cognition and memory are normal.  Nursing note and vitals reviewed.   Results for orders placed or performed during the hospital encounter of 09/19/17  CBC with Differential  Result Value Ref Range   WBC 9.4 3.6 - 11.0 K/uL   RBC 3.89 3.80 - 5.20 MIL/uL   Hemoglobin 11.1 (L) 12.0 - 16.0 g/dL   HCT 33.8 (L) 35.0 - 47.0 %   MCV 87.1 80.0 - 100.0 fL   MCH 28.5 26.0 - 34.0 pg   MCHC 32.7 32.0 - 36.0 g/dL   RDW 18.5 (H) 11.5 - 14.5 %   Platelets 189 150 - 440 K/uL   Neutrophils Relative % 82 %   Neutro Abs 7.8 (H) 1.4 - 6.5 K/uL   Lymphocytes Relative 10 %   Lymphs Abs 0.9 (L) 1.0 - 3.6 K/uL   Monocytes Relative 5 %   Monocytes Absolute 0.5 0.2 - 0.9  K/uL   Eosinophils Relative 2 %   Eosinophils Absolute 0.2 0 - 0.7 K/uL   Basophils Relative 1 %   Basophils Absolute 0.1 0 - 0.1 K/uL  Basic metabolic panel  Result Value Ref Range   Sodium 142 135 - 145 mmol/L   Potassium 5.0 3.5 - 5.1 mmol/L   Chloride 104 98 - 111 mmol/L   CO2 30 22 - 32 mmol/L   Glucose, Bld 204 (H) 70 - 99 mg/dL   BUN 30 (H) 8 - 23 mg/dL   Creatinine, Ser 1.97 (H) 0.44 - 1.00 mg/dL   Calcium 8.7 (L) 8.9 - 10.3 mg/dL   GFR calc non Af Amer 24 (L) >60 mL/min   GFR calc Af Amer 27 (L) >60 mL/min   Anion gap 8 5 - 15  Brain natriuretic peptide  Result Value Ref Range   B Natriuretic Peptide 201.0 (H) 0.0 - 100.0 pg/mL  Troponin I  Result Value Ref Range   Troponin I <0.03 <0.03 ng/mL  Blood gas, arterial  Result Value Ref Range   FIO2 0.35    Delivery systems BILEVEL POSITIVE AIRWAY PRESSURE    LHR 8 resp/min   Inspiratory PAP 12    Expiratory PAP 6.0    pH, Arterial 7.40 7.350 - 7.450   pCO2 arterial 50 (H) 32.0 - 48.0 mmHg   pO2, Arterial 62 (L) 83.0 - 108.0 mmHg   Bicarbonate 31.0 (H) 20.0 - 28.0 mmol/L   Acid-Base Excess 5.2 (H) 0.0 - 2.0 mmol/L   O2 Saturation 91.4 %   Patient temperature 37.0    Collection site LEFT BRACHIAL    Sample type ARTERIAL DRAW   Basic metabolic panel  Result Value Ref Range   Sodium 136 135 - 145 mmol/L   Potassium 5.5 (H) 3.5 - 5.1 mmol/L   Chloride 99 98 - 111 mmol/L   CO2 29 22 - 32 mmol/L   Glucose, Bld 442 (H) 70 - 99 mg/dL   BUN 36 (H) 8 - 23 mg/dL   Creatinine, Ser 1.79 (H) 0.44 - 1.00 mg/dL   Calcium 8.1 (L) 8.9 - 10.3 mg/dL   GFR calc non Af Amer 27 (L) >60 mL/min   GFR calc Af Amer 31 (L) >60 mL/min  Anion gap 8 5 - 15  Glucose, capillary  Result Value Ref Range   Glucose-Capillary 375 (H) 70 - 99 mg/dL   Comment 1 Notify RN   Basic metabolic panel  Result Value Ref Range   Sodium 138 135 - 145 mmol/L   Potassium 4.7 3.5 - 5.1 mmol/L   Chloride 101 98 - 111 mmol/L   CO2 28 22 - 32 mmol/L    Glucose, Bld 255 (H) 70 - 99 mg/dL   BUN 36 (H) 8 - 23 mg/dL   Creatinine, Ser 1.71 (H) 0.44 - 1.00 mg/dL   Calcium 8.4 (L) 8.9 - 10.3 mg/dL   GFR calc non Af Amer 28 (L) >60 mL/min   GFR calc Af Amer 33 (L) >60 mL/min   Anion gap 9 5 - 15  Glucose, capillary  Result Value Ref Range   Glucose-Capillary 235 (H) 70 - 99 mg/dL   Comment 1 Notify RN   Glucose, capillary  Result Value Ref Range   Glucose-Capillary 250 (H) 70 - 99 mg/dL  Glucose, capillary  Result Value Ref Range   Glucose-Capillary 189 (H) 70 - 99 mg/dL  Basic metabolic panel  Result Value Ref Range   Sodium 141 135 - 145 mmol/L   Potassium 4.6 3.5 - 5.1 mmol/L   Chloride 101 98 - 111 mmol/L   CO2 31 22 - 32 mmol/L   Glucose, Bld 214 (H) 70 - 99 mg/dL   BUN 44 (H) 8 - 23 mg/dL   Creatinine, Ser 1.94 (H) 0.44 - 1.00 mg/dL   Calcium 8.2 (L) 8.9 - 10.3 mg/dL   GFR calc non Af Amer 24 (L) >60 mL/min   GFR calc Af Amer 28 (L) >60 mL/min   Anion gap 9 5 - 15  Glucose, capillary  Result Value Ref Range   Glucose-Capillary 335 (H) 70 - 99 mg/dL  Glucose, capillary  Result Value Ref Range   Glucose-Capillary 196 (H) 70 - 99 mg/dL   Comment 1 Notify RN   Glucose, capillary  Result Value Ref Range   Glucose-Capillary 241 (H) 70 - 99 mg/dL  Glucose, capillary  Result Value Ref Range   Glucose-Capillary 86 70 - 99 mg/dL  Glucose, capillary  Result Value Ref Range   Glucose-Capillary 212 (H) 70 - 99 mg/dL      Assessment & Plan:   Problem List Items Addressed This Visit      Respiratory   COPD (chronic obstructive pulmonary disease) ([place]) - Primary    Patient's SOB is stable right now. No wheezing. Does not have a follow up with pulmonology just yet. Called pulmonology and appointment scheduled for 8/19 with Dr. Alva Garnet. Continue to monitor closely. Will recheck 2-3 weeks. Call with any concerns.       Relevant Orders   Ambulatory referral to Pulmonology       Follow up plan: Return 2-3  weeks.

## 2017-09-26 NOTE — Assessment & Plan Note (Signed)
Patient's SOB is stable right now. No wheezing. Does not have a follow up with pulmonology just yet. Called pulmonology and appointment scheduled for 8/19 with Dr. Alva Garnet. Continue to monitor closely. Will recheck 2-3 weeks. Call with any concerns.

## 2017-09-30 ENCOUNTER — Inpatient Hospital Stay
Admission: EM | Admit: 2017-09-30 | Discharge: 2017-10-04 | DRG: 190 | Disposition: A | Discharge status: Other Acute Inpatient Hospital | Payer: Medicare HMO | Attending: Internal Medicine | Admitting: Internal Medicine

## 2017-09-30 ENCOUNTER — Emergency Department: Payer: Medicare HMO

## 2017-09-30 DIAGNOSIS — Z515 Encounter for palliative care: Secondary | ICD-10-CM

## 2017-09-30 DIAGNOSIS — R4701 Aphasia: Secondary | ICD-10-CM | POA: Diagnosis not present

## 2017-09-30 DIAGNOSIS — T380X5A Adverse effect of glucocorticoids and synthetic analogues, initial encounter: Secondary | ICD-10-CM | POA: Diagnosis present

## 2017-09-30 DIAGNOSIS — Z6841 Body Mass Index (BMI) 40.0 and over, adult: Secondary | ICD-10-CM | POA: Diagnosis not present

## 2017-09-30 DIAGNOSIS — J9600 Acute respiratory failure, unspecified whether with hypoxia or hypercapnia: Secondary | ICD-10-CM

## 2017-09-30 DIAGNOSIS — E1165 Type 2 diabetes mellitus with hyperglycemia: Secondary | ICD-10-CM | POA: Diagnosis present

## 2017-09-30 DIAGNOSIS — E875 Hyperkalemia: Secondary | ICD-10-CM | POA: Diagnosis not present

## 2017-09-30 DIAGNOSIS — F411 Generalized anxiety disorder: Secondary | ICD-10-CM | POA: Diagnosis not present

## 2017-09-30 DIAGNOSIS — Z79899 Other long term (current) drug therapy: Secondary | ICD-10-CM | POA: Diagnosis not present

## 2017-09-30 DIAGNOSIS — I639 Cerebral infarction, unspecified: Secondary | ICD-10-CM | POA: Diagnosis not present

## 2017-09-30 DIAGNOSIS — I6340 Cerebral infarction due to embolism of unspecified cerebral artery: Secondary | ICD-10-CM | POA: Diagnosis not present

## 2017-09-30 DIAGNOSIS — Z8673 Personal history of transient ischemic attack (TIA), and cerebral infarction without residual deficits: Secondary | ICD-10-CM

## 2017-09-30 DIAGNOSIS — F329 Major depressive disorder, single episode, unspecified: Secondary | ICD-10-CM | POA: Diagnosis present

## 2017-09-30 DIAGNOSIS — N183 Chronic kidney disease, stage 3 (moderate): Secondary | ICD-10-CM | POA: Diagnosis present

## 2017-09-30 DIAGNOSIS — I248 Other forms of acute ischemic heart disease: Secondary | ICD-10-CM | POA: Diagnosis present

## 2017-09-30 DIAGNOSIS — Z96651 Presence of right artificial knee joint: Secondary | ICD-10-CM | POA: Diagnosis present

## 2017-09-30 DIAGNOSIS — I129 Hypertensive chronic kidney disease with stage 1 through stage 4 chronic kidney disease, or unspecified chronic kidney disease: Secondary | ICD-10-CM | POA: Diagnosis not present

## 2017-09-30 DIAGNOSIS — F419 Anxiety disorder, unspecified: Secondary | ICD-10-CM

## 2017-09-30 DIAGNOSIS — Z7951 Long term (current) use of inhaled steroids: Secondary | ICD-10-CM

## 2017-09-30 DIAGNOSIS — K219 Gastro-esophageal reflux disease without esophagitis: Secondary | ICD-10-CM | POA: Diagnosis present

## 2017-09-30 DIAGNOSIS — J9601 Acute respiratory failure with hypoxia: Secondary | ICD-10-CM

## 2017-09-30 DIAGNOSIS — Z87891 Personal history of nicotine dependence: Secondary | ICD-10-CM

## 2017-09-30 DIAGNOSIS — J441 Chronic obstructive pulmonary disease with (acute) exacerbation: Secondary | ICD-10-CM | POA: Diagnosis not present

## 2017-09-30 DIAGNOSIS — Z7189 Other specified counseling: Secondary | ICD-10-CM | POA: Diagnosis not present

## 2017-09-30 DIAGNOSIS — E785 Hyperlipidemia, unspecified: Secondary | ICD-10-CM | POA: Diagnosis not present

## 2017-09-30 DIAGNOSIS — R0600 Dyspnea, unspecified: Secondary | ICD-10-CM | POA: Diagnosis not present

## 2017-09-30 DIAGNOSIS — E1122 Type 2 diabetes mellitus with diabetic chronic kidney disease: Secondary | ICD-10-CM | POA: Diagnosis present

## 2017-09-30 DIAGNOSIS — J9621 Acute and chronic respiratory failure with hypoxia: Secondary | ICD-10-CM | POA: Diagnosis present

## 2017-09-30 DIAGNOSIS — R0603 Acute respiratory distress: Secondary | ICD-10-CM | POA: Diagnosis not present

## 2017-09-30 DIAGNOSIS — R29708 NIHSS score 8: Secondary | ICD-10-CM | POA: Diagnosis not present

## 2017-09-30 DIAGNOSIS — I10 Essential (primary) hypertension: Secondary | ICD-10-CM | POA: Diagnosis not present

## 2017-09-30 DIAGNOSIS — R0602 Shortness of breath: Secondary | ICD-10-CM | POA: Diagnosis not present

## 2017-09-30 LAB — COMPREHENSIVE METABOLIC PANEL WITH GFR
ALT: 22 U/L (ref 0–44)
AST: 17 U/L (ref 15–41)
Albumin: 3.2 g/dL — ABNORMAL LOW (ref 3.5–5.0)
Alkaline Phosphatase: 67 U/L (ref 38–126)
Anion gap: 9 (ref 5–15)
BUN: 32 mg/dL — ABNORMAL HIGH (ref 8–23)
CO2: 32 mmol/L (ref 22–32)
Calcium: 8.8 mg/dL — ABNORMAL LOW (ref 8.9–10.3)
Chloride: 102 mmol/L (ref 98–111)
Creatinine, Ser: 1.72 mg/dL — ABNORMAL HIGH (ref 0.44–1.00)
GFR calc Af Amer: 32 mL/min — ABNORMAL LOW
GFR calc non Af Amer: 28 mL/min — ABNORMAL LOW
Glucose, Bld: 145 mg/dL — ABNORMAL HIGH (ref 70–99)
Potassium: 4.4 mmol/L (ref 3.5–5.1)
Sodium: 143 mmol/L (ref 135–145)
Total Bilirubin: 0.8 mg/dL (ref 0.3–1.2)
Total Protein: 6.4 g/dL — ABNORMAL LOW (ref 6.5–8.1)

## 2017-09-30 LAB — MRSA PCR SCREENING: MRSA BY PCR: NEGATIVE

## 2017-09-30 LAB — BLOOD GAS, VENOUS
PATIENT TEMPERATURE: 37
PCO2 VEN: 59 mmHg (ref 44.0–60.0)
PH VEN: 7.39 (ref 7.250–7.430)
PO2 VEN: 32 mmHg (ref 32.0–45.0)

## 2017-09-30 LAB — CBC WITH DIFFERENTIAL/PLATELET
BASOS PCT: 0 %
Basophils Absolute: 0 10*3/uL (ref 0–0.1)
EOS PCT: 0 %
Eosinophils Absolute: 0 10*3/uL (ref 0–0.7)
HEMATOCRIT: 33.9 % — AB (ref 35.0–47.0)
Hemoglobin: 11.3 g/dL — ABNORMAL LOW (ref 12.0–16.0)
LYMPHS PCT: 20 %
Lymphs Abs: 1.6 10*3/uL (ref 1.0–3.6)
MCH: 28.6 pg (ref 26.0–34.0)
MCHC: 33.5 g/dL (ref 32.0–36.0)
MCV: 85.5 fL (ref 80.0–100.0)
MONO ABS: 0.5 10*3/uL (ref 0.2–0.9)
MONOS PCT: 7 %
NEUTROS ABS: 5.8 10*3/uL (ref 1.4–6.5)
Neutrophils Relative %: 73 %
Platelets: 167 10*3/uL (ref 150–440)
RBC: 3.96 MIL/uL (ref 3.80–5.20)
RDW: 18.6 % — AB (ref 11.5–14.5)
WBC: 8 10*3/uL (ref 3.6–11.0)

## 2017-09-30 LAB — TROPONIN I: TROPONIN I: 0.03 ng/mL — AB (ref <0.03)

## 2017-09-30 LAB — GLUCOSE, CAPILLARY: Glucose-Capillary: 187 mg/dL — ABNORMAL HIGH (ref 70–99)

## 2017-09-30 LAB — BRAIN NATRIURETIC PEPTIDE: B NATRIURETIC PEPTIDE 5: 171 pg/mL — AB (ref 0.0–100.0)

## 2017-09-30 MED ORDER — ALBUTEROL SULFATE (2.5 MG/3ML) 0.083% IN NEBU
2.5000 mg | INHALATION_SOLUTION | Freq: Once | RESPIRATORY_TRACT | Status: AC
  Administered 2017-09-30: 2.5 mg via RESPIRATORY_TRACT
  Filled 2017-09-30: qty 3

## 2017-09-30 MED ORDER — LIDOCAINE HCL (PF) 1 % IJ SOLN
INTRAMUSCULAR | Status: AC
  Filled 2017-09-30: qty 5

## 2017-09-30 MED ORDER — LORAZEPAM 2 MG/ML IJ SOLN
0.5000 mg | Freq: Once | INTRAMUSCULAR | Status: AC
  Administered 2017-09-30: 0.5 mg via INTRAVENOUS

## 2017-09-30 MED ORDER — UMECLIDINIUM-VILANTEROL 62.5-25 MCG/INH IN AEPB: 1.0000 | INHALATION_SPRAY | Freq: Every day | RESPIRATORY_TRACT | Status: DC

## 2017-09-30 MED ORDER — HYDRALAZINE HCL 25 MG PO TABS
25.0000 mg | ORAL_TABLET | Freq: Three times a day (TID) | ORAL | Status: DC
  Administered 2017-09-30 – 2017-10-03 (×9): 25 mg via ORAL
  Filled 2017-09-30 (×13): qty 1

## 2017-09-30 MED ORDER — CLOPIDOGREL BISULFATE 75 MG PO TABS
75.0000 mg | ORAL_TABLET | Freq: Every day | ORAL | Status: DC
  Administered 2017-10-01 – 2017-10-03 (×3): 75 mg via ORAL
  Filled 2017-09-30 (×3): qty 1

## 2017-09-30 MED ORDER — CHLORHEXIDINE GLUCONATE 0.12 % MT SOLN
15.0000 mL | Freq: Two times a day (BID) | OROMUCOSAL | Status: DC
  Administered 2017-10-01 – 2017-10-03 (×6): 15 mL via OROMUCOSAL
  Filled 2017-09-30 (×6): qty 15

## 2017-09-30 MED ORDER — LORAZEPAM 0.5 MG PO TABS: 0.5000 mg | ORAL_TABLET | Freq: Once | ORAL | Status: DC

## 2017-09-30 MED ORDER — METHYLPREDNISOLONE SODIUM SUCC 125 MG IJ SOLR
125.0000 mg | Freq: Once | INTRAMUSCULAR | Status: AC
  Administered 2017-09-30: 125 mg via INTRAVENOUS
  Filled 2017-09-30: qty 2

## 2017-09-30 MED ORDER — IPRATROPIUM-ALBUTEROL 0.5-2.5 (3) MG/3ML IN SOLN
3.0000 mL | Freq: Four times a day (QID) | RESPIRATORY_TRACT | Status: DC
  Administered 2017-09-30 – 2017-10-04 (×15): 3 mL via RESPIRATORY_TRACT
  Filled 2017-09-30 (×14): qty 3

## 2017-09-30 MED ORDER — FUROSEMIDE 10 MG/ML IJ SOLN
40.0000 mg | Freq: Once | INTRAMUSCULAR | Status: AC
  Administered 2017-09-30: 40 mg via INTRAVENOUS
  Filled 2017-09-30: qty 4

## 2017-09-30 MED ORDER — DOXYCYCLINE HYCLATE 100 MG PO TABS
100.0000 mg | ORAL_TABLET | Freq: Two times a day (BID) | ORAL | Status: DC
  Administered 2017-09-30 – 2017-10-03 (×7): 100 mg via ORAL
  Filled 2017-09-30 (×8): qty 1

## 2017-09-30 MED ORDER — ALBUTEROL SULFATE (2.5 MG/3ML) 0.083% IN NEBU: 2.5000 mg | INHALATION_SOLUTION | RESPIRATORY_TRACT | Status: DC | PRN

## 2017-09-30 MED ORDER — IPRATROPIUM-ALBUTEROL 0.5-2.5 (3) MG/3ML IN SOLN
3.0000 mL | Freq: Once | RESPIRATORY_TRACT | Status: AC
Start: 2017-09-30 — End: 2017-09-30
  Administered 2017-09-30: 3 mL via RESPIRATORY_TRACT
  Filled 2017-09-30: qty 3

## 2017-09-30 MED ORDER — ACETAMINOPHEN 325 MG PO TABS
650.0000 mg | ORAL_TABLET | Freq: Four times a day (QID) | ORAL | Status: DC | PRN
  Administered 2017-09-30 – 2017-10-03 (×3): 650 mg via ORAL
  Filled 2017-09-30 (×3): qty 2

## 2017-09-30 MED ORDER — FLUOXETINE HCL 10 MG PO CAPS
10.0000 mg | ORAL_CAPSULE | Freq: Every day | ORAL | Status: DC
  Administered 2017-10-01 – 2017-10-03 (×3): 10 mg via ORAL
  Filled 2017-09-30 (×5): qty 1

## 2017-09-30 MED ORDER — VITAMIN D 1000 UNITS PO TABS
1000.0000 [IU] | ORAL_TABLET | Freq: Every day | ORAL | Status: DC
  Administered 2017-10-01 – 2017-10-03 (×3): 1000 [IU] via ORAL
  Filled 2017-09-30 (×3): qty 1

## 2017-09-30 MED ORDER — ALPRAZOLAM 0.5 MG PO TABS
0.5000 mg | ORAL_TABLET | Freq: Two times a day (BID) | ORAL | Status: DC | PRN
  Administered 2017-10-01 – 2017-10-03 (×3): 0.5 mg via ORAL
  Filled 2017-09-30 (×3): qty 1

## 2017-09-30 MED ORDER — METHYLPREDNISOLONE SODIUM SUCC 125 MG IJ SOLR
60.0000 mg | Freq: Four times a day (QID) | INTRAMUSCULAR | Status: DC
Start: 2017-09-30 — End: 2017-10-01
  Administered 2017-09-30 – 2017-10-01 (×4): 60 mg via INTRAVENOUS
  Filled 2017-09-30 (×4): qty 2

## 2017-09-30 MED ORDER — PANTOPRAZOLE SODIUM 40 MG PO TBEC
40.0000 mg | DELAYED_RELEASE_TABLET | Freq: Every day | ORAL | Status: DC
  Administered 2017-10-01 – 2017-10-03 (×3): 40 mg via ORAL
  Filled 2017-09-30 (×3): qty 1

## 2017-09-30 MED ORDER — DILTIAZEM HCL ER COATED BEADS 120 MG PO CP24
120.0000 mg | ORAL_CAPSULE | Freq: Every day | ORAL | Status: DC
  Administered 2017-10-01 – 2017-10-03 (×3): 120 mg via ORAL
  Filled 2017-09-30 (×4): qty 1

## 2017-09-30 MED ORDER — SENNA 8.6 MG PO TABS: 2.0000 | ORAL_TABLET | Freq: Every evening | ORAL | Status: DC | PRN

## 2017-09-30 MED ORDER — DOCUSATE SODIUM 100 MG PO CAPS
100.0000 mg | ORAL_CAPSULE | Freq: Every day | ORAL | Status: DC
  Administered 2017-10-01 – 2017-10-03 (×3): 100 mg via ORAL
  Filled 2017-09-30 (×3): qty 1

## 2017-09-30 MED ORDER — LORAZEPAM 2 MG/ML IJ SOLN
INTRAMUSCULAR | Status: AC
  Administered 2017-09-30: 0.5 mg via INTRAVENOUS
  Filled 2017-09-30: qty 1

## 2017-09-30 MED ORDER — ORAL CARE MOUTH RINSE
15.0000 mL | Freq: Two times a day (BID) | OROMUCOSAL | Status: DC
  Administered 2017-10-01 – 2017-10-03 (×6): 15 mL via OROMUCOSAL

## 2017-09-30 MED ORDER — GABAPENTIN 300 MG PO CAPS
300.0000 mg | ORAL_CAPSULE | Freq: Every day | ORAL | Status: DC
  Administered 2017-09-30 – 2017-10-03 (×4): 300 mg via ORAL
  Filled 2017-09-30 (×4): qty 1

## 2017-09-30 MED ORDER — TIOTROPIUM BROMIDE MONOHYDRATE 18 MCG IN CAPS: 18.0000 ug | ORAL_CAPSULE | Freq: Every day | RESPIRATORY_TRACT | Status: DC

## 2017-09-30 NOTE — ED Notes (Signed)
Pt taken off bi pap by MD with immediate drop in O2 sat to 87% on room air. Pt placed back on Bi pap by MD.

## 2017-09-30 NOTE — Progress Notes (Signed)
Transported on bipap to ICU2 without incident.Report given to ICU therapist.

## 2017-09-30 NOTE — ED Provider Notes (Signed)
[facility] Emergency Department Provider Note    First MD Initiated Contact with Patient 09/30/17 1233     (approximate)  I have reviewed the triage vital signs and the nursing notes.   HISTORY  Chief Complaint Respiratory Distress    HPI Katherine [name] is a 77 y.o. female extensive past medical history as well as COPD, angioedema CKD congestive heart failure presents the ER in respiratory distress.  Portal he was having some shortness of breath yesterday but then started having more severe shortness of breath while at church today.  Denies any chest pain or pressure.  No tongue swelling.  EMS was called for shortness of breath and found the patient tripoding in moderate respiratory distress.  She is placed on CPAP and transferred to the ER.    Past Medical History:  Diagnosis Date  . Angioedema   . Anxiety   . Anxiety and depression   . Chronic constipation   . Chronic kidney disease    stage 3  . COPD (chronic obstructive pulmonary disease) ([place])   . Gallstones   . GERD (gastroesophageal reflux disease)   . Hypertension   . Left ventricular hypertrophy   . Osteoarthritis   . Prurigo nodularis   . Tobacco abuse   . Vitamin D deficiency disease    Family History  Problem Relation Age of Onset  . Alcohol abuse Mother   . Sickle cell anemia Daughter   . Hypertension Son   . Cancer Neg Hx   . COPD Neg Hx   . Diabetes Neg Hx   . Heart disease Neg Hx   . Stroke Neg Hx    Past Surgical History:  Procedure Laterality Date  . CHOLECYSTECTOMY    . 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   Procedure: TOTAL KNEE ARTHROPLASTY;  Surgeon: Hessie Knows, MD;  Location: [facility] ORS;  Service: Orthopedics;  Laterality: Right;   Patient Active Problem List   Diagnosis Date Noted  . CAP (community acquired pneumonia) 09/11/2017  . Elevated rheumatoid factor 09/05/2017  . COPD exacerbation ([place]) 08/27/2017  .  Frequent hospital admissions 08/22/2017  . Aphasia 06/28/2017  . Acute on chronic respiratory failure with hypoxia ([place]) 06/12/2017  . Hand pain 03/21/2017  . Dry skin 03/21/2017  . Pain in finger of left hand 09/12/2016  . Chronic fatigue 06/12/2016  . Left knee pain 06/12/2016  . Advanced care planning/counseling discussion 03/13/2016  . Primary localized osteoarthritis of right knee 02/08/2016  . OSA and COPD overlap syndrome ([place]) 09/16/2015  . Rotator cuff syndrome 09/07/2015  . 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 Sleep disturbance 04/14/2015  . Coronary artery disease 03/14/2015  . 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 Anxiety   . Tobacco abuse   . Prurigo nodularis   . GERD (gastroesophageal reflux disease)   . COPD (chronic obstructive pulmonary disease) ([place])   . Osteoarthritis   . Vitamin D deficiency disease   . Chronic constipation   . CKD (chronic kidney disease), stage III ([place])   . LVH (left ventricular hypertrophy) 05/20/2013  . Essential hypertension 05/20/2013  . Morbid obesity ([place]) 05/20/2013      Prior to Admission medications   Medication Sig Start Date End Date Taking? Authorizing Provider  hydrALAZINE (APRESOLINE) 25 MG tablet Take 1 tablet (25 mg total) by mouth 3 (three) times daily. 09/05/17  Yes [name], NP  acetaminophen (TYLENOL) 650  MG CR tablet Take 650-1,300 mg by mouth every 8 (eight) hours as needed for pain.     [provider]  albuterol (ACCUNEB) 0.63 MG/3ML nebulizer solution Take 3 mLs (0.63 mg total) by nebulization 3 (three) times daily as needed for wheezing. DX:J44.9 07/23/17   [name], MD  albuterol (PROVENTIL HFA) 108 (90 Base) MCG/ACT inhaler Inhale 2 puffs into the lungs every 4 (four) hours as needed for wheezing or shortness of breath. 05/30/17   Loletha Grayer, MD  ALPRAZolam Duanne Moron) 0.5 MG tablet Take 1 tablet (0.5 mg total) by mouth 2 (two) times daily as needed for up  to 15 doses for anxiety. 09/22/17   Gouru, Illene Silver, MD  atorvastatin (LIPITOR) 80 MG tablet Take 80 mg by mouth daily. 08/02/17   [provider]  cetirizine (ZYRTEC) 10 MG tablet TAKE 1/2 TABLET(5 MG) BY MOUTH AT BEDTIME 09/24/17   Trinna Post, PA-C  cholecalciferol (VITAMIN D) 1000 units tablet Take 1,000 Units by mouth daily.    [provider]  clopidogrel (PLAVIX) 75 MG tablet Take 1 tablet (75 mg total) by mouth daily. 09/05/17   [name], NP  diltiazem (CARDIZEM CD) 120 MG 24 hr capsule Take 120 mg by mouth daily.    [provider]  docusate sodium (COLACE) 100 MG capsule Take 1 capsule (100 mg total) by mouth daily. 09/22/17   Gouru, Illene Silver, MD  FLUoxetine (PROZAC) 10 MG capsule TAKE 1 CAPSULE(10 MG) BY MOUTH DAILY 07/25/17   Johnson, Megan P, DO  Fluticasone-Umeclidin-Vilant (TRELEGY ELLIPTA) 100-62.5-25 MCG/INH AEPB Inhale 1 puff into the lungs daily. 08/09/17   [name], MD  gabapentin (NEURONTIN) 300 MG capsule Take 1 capsule (300 mg total) by mouth at bedtime. 09/05/17   [name], NP  ipratropium (ATROVENT) 0.03 % nasal spray Place 2 sprays into both nostrils every 12 (twelve) hours.    [provider]  ipratropium-albuterol (DUONEB) 0.5-2.5 (3) MG/3ML SOLN USE 1 VIAL VIA NEBULIZER EVERY 6 HOURS AS NEEDED 07/23/17   [name], MD  meclizine (ANTIVERT) 25 MG tablet Take 1 tablet (25 mg total) by mouth 2 (two) times daily as needed for dizziness. 09/22/17 10/22/17  Nicholes Mango, MD  methocarbamol (ROBAXIN) 500 MG tablet Take 1 tablet (500MG ) by mouth every 8 hours as needed for muscle spasms 08/08/17   [provider]  pantoprazole (PROTONIX) 40 MG tablet Take 1 tablet (40 mg total) by mouth daily. 09/05/17   [name], NP  predniSONE (STERAPRED UNI-PAK 21 TAB) 10 MG (21) TBPK tablet Take 1 tablet (10 mg total) by mouth daily. Take 6 tablets by mouth for 1 day followed by  5 tablets by mouth for 1 day followed by  4  tablets by mouth for 1 day followed by  3 tablets by mouth for 1 day followed by  2 tablets by mouth for 1 day followed by  1 tablet by mouth for a day and stop Patient not taking: Reported on 09/30/2017 09/22/17   Nicholes Mango, MD  promethazine (PHENERGAN) 12.5 MG tablet Take 12.5 mg by mouth every 6 (six) hours as needed for nausea or vomiting.     [provider]  senna (SENOKOT) 8.6 MG tablet Take 2 tablets by mouth at bedtime as needed for constipation.     [provider]  tiotropium (SPIRIVA) 18 MCG inhalation capsule Place 18 mcg into inhaler and inhale daily.    [provider]    Allergies Bee venom  and Enalapril maleate    Social History Social History   Tobacco Use  . Smoking status: Former Smoker    Packs/day: 0.50    Years: 60.00    Pack years: 30.00    Types: Cigarettes  . Smokeless tobacco: Never Used  Substance Use Topics  . Alcohol use: No    Alcohol/week: 0.0 standard drinks    Comment: rare  . Drug use: No    Review of Systems Patient denies headaches, rhinorrhea, blurry vision, numbness, shortness of breath, chest pain, edema, cough, abdominal pain, nausea, vomiting, diarrhea, dysuria, fevers, rashes or hallucinations unless otherwise stated above in HPI. ____________________________________________   PHYSICAL EXAM:  VITAL SIGNS: Vitals:   09/30/17 1233 09/30/17 1236  BP: 122/78   Pulse: 93   Temp: 98.7 F (37.1 C)   SpO2: 98% 98%    Constitutional: Alert and oriented. Arrives on cpap in moderate respiratory distress, protecting her airway Eyes: Conjunctivae are normal.  Head: Atraumatic. Nose: No congestion/rhinnorhea. Mouth/Throat: Mucous membranes are moist.   Neck: No stridor. Painless ROM.  Cardiovascular: Normal rate, regular rhythm. Grossly normal heart sounds.  Good peripheral circulation. Respiratory: tachypnea, with + use of accessory muscles, diminished breathsouds bilaterally. Gastrointestinal: Soft and  nontender. No distention. No abdominal bruits. No CVA tenderness. Genitourinary: deferred Musculoskeletal: No lower extremity tenderness nor edema.  No joint effusions. Neurologic:  Normal speech and language. No gross focal neurologic deficits are appreciated. No facial droop Skin:  Skin is warm, dry and intact. No rash noted. Psychiatric:anxious appearing ____________________________________________   LABS (all labs ordered are listed, but only abnormal results are displayed)  Results for orders placed or performed during the hospital encounter of 09/30/17 (from the past 24 hour(s))  CBC with Differential/Platelet     Status: Abnormal   Collection Time: 09/30/17 12:35 PM  Result Value Ref Range   WBC 8.0 3.6 - 11.0 K/uL   RBC 3.96 3.80 - 5.20 MIL/uL   Hemoglobin 11.3 (L) 12.0 - 16.0 g/dL   HCT 33.9 (L) 35.0 - 47.0 %   MCV 85.5 80.0 - 100.0 fL   MCH 28.6 26.0 - 34.0 pg   MCHC 33.5 32.0 - 36.0 g/dL   RDW 18.6 (H) 11.5 - 14.5 %   Platelets 167 150 - 440 K/uL   Neutrophils Relative % 73 %   Neutro Abs 5.8 1.4 - 6.5 K/uL   Lymphocytes Relative 20 %   Lymphs Abs 1.6 1.0 - 3.6 K/uL   Monocytes Relative 7 %   Monocytes Absolute 0.5 0.2 - 0.9 K/uL   Eosinophils Relative 0 %   Eosinophils Absolute 0.0 0 - 0.7 K/uL   Basophils Relative 0 %   Basophils Absolute 0.0 0 - 0.1 K/uL  Comprehensive metabolic panel     Status: Abnormal   Collection Time: 09/30/17 12:35 PM  Result Value Ref Range   Sodium 143 135 - 145 mmol/L   Potassium 4.4 3.5 - 5.1 mmol/L   Chloride 102 98 - 111 mmol/L   CO2 32 22 - 32 mmol/L   Glucose, Bld 145 (H) 70 - 99 mg/dL   BUN 32 (H) 8 - 23 mg/dL   Creatinine, Ser 1.72 (H) 0.44 - 1.00 mg/dL   Calcium 8.8 (L) 8.9 - 10.3 mg/dL   Total Protein 6.4 (L) 6.5 - 8.1 g/dL   Albumin 3.2 (L) 3.5 - 5.0 g/dL   AST 17 15 - 41 U/L   ALT 22 0 - 44 U/L   Alkaline Phosphatase 67  38 - 126 U/L   Total Bilirubin 0.8 0.3 - 1.2 mg/dL   GFR calc non Af Amer 28 (L) >60 mL/min    GFR calc Af Amer 32 (L) >60 mL/min   Anion gap 9 5 - 15  Blood gas, venous     Status: None   Collection Time: 09/30/17 12:35 PM  Result Value Ref Range   pH, Ven 7.39 7.250 - 7.430   pCO2, Ven 59 44.0 - 60.0 mmHg   pO2, Ven 32.0 32.0 - 45.0 mmHg   Patient temperature 37.0    Collection site VENOUS    Sample type VENOUS    ____________________________________________  EKG My review and personal interpretation at Time: 12:39   Indication: resp distress  Rate: 110  Rhythm: sinus Axis: normal Other: normal intervals, no stemi ____________________________________________  RADIOLOGY  I personally reviewed all radiographic images ordered to evaluate for the above acute complaints and reviewed radiology reports and findings.  These findings were personally discussed with the patient.  Please see medical record for radiology report.  ____________________________________________   PROCEDURES  Procedure(s) performed:  .Critical Care Performed by: Merlyn Lot, MD Authorized by: Merlyn Lot, MD   Critical care provider statement:    Critical care time (minutes):  40   Critical care time was exclusive of:  Separately billable procedures and treating other patients   Critical care was necessary to treat or prevent imminent or life-threatening deterioration of the following conditions:  Respiratory failure   Critical care was time spent personally by me on the following activities:  Development of treatment plan with patient or surrogate, discussions with consultants, evaluation of patient's response to treatment, examination of patient, obtaining history from patient or surrogate, ordering and performing treatments and interventions, ordering and review of laboratory studies, ordering and review of radiographic studies, pulse oximetry, re-evaluation of patient's condition and review of old charts      Critical Care performed:  yes ____________________________________________   INITIAL IMPRESSION / [place] / ED COURSE  Pertinent labs & imaging results that were available during my care of the patient were reviewed by me and considered in my medical decision making (see chart for details).   DDX: Asthma, copd, CHF, pna, ptx, malignancy, Pe, anemia   Katherine [name] is a 77 y.o. who presents to the ED with aspiratory distress as described above.  Patient transition from CPAP over to BiPAP based on her acute respiratory failure.  Will give nebs as well as steroids as her lung sounds and presentation is concerning for COPD exacerbation.  The patient will be placed on continuous pulse oximetry and telemetry for monitoring.  Laboratory evaluation will be sent to evaluate for the above complaints.     Clinical Course as of Sep 30 1416  Sun Sep 30, 2017  1300 Due to difficulty with obtaining IV access, a 20G peripheral IV catheter was inserted using US guidance into the left forearm.  The site was prepped with chlorhexidine and allowed to dry.  The patient tolerated the procedure without any complications.    [PR]  1300 Gas is reassuring.  Will trial ativan as she does have h/o anxiety   [PR]  1336 No significant improvement with Ativan.  Patient was hypoxic to 87% on room air.  Still with diffuse wheezing actually having him improved aeration after nebulizers and Solu-Medrol.  This is consistent with COPD exacerbation and as she is requiring BiPAP do feel patient will require hospitalization for further medical management.   [  PR]    Clinical Course User Index [PR] Merlyn Lot, MD     As part of my medical decision making, I reviewed the following data within the [place] notes reviewed and incorporated, Labs reviewed, notes from prior ED visits.   ____________________________________________   FINAL CLINICAL IMPRESSION(S) / ED DIAGNOSES  Final diagnoses:  Acute  respiratory failure with hypoxia (HCC)  COPD exacerbation (HCC)      NEW MEDICATIONS STARTED DURING THIS VISIT:  New Prescriptions   No medications on file     Note:  This document was prepared using Dragon voice recognition software and may include unintentional dictation errors.    Merlyn Lot, MD 09/30/17 [phone]

## 2017-09-30 NOTE — ED Notes (Signed)
.   Pt is resting, Respirations even and unlabored, NAD. Stretcher lowest postion and locked. Call bell within reach. Denies any needs at this time RN will continue to monitor. Pt is on Bi pap at this time tolerating well. Family at bedside.

## 2017-09-30 NOTE — Consult Note (Signed)
Reason for Consult:Respiratory Failure Referring Physician: Dr. Sharyn Dross is an 77 y.o. female.  HPI: Katherine [name] is a 77 year old female with a past medical history remarkable for COPD with chronic respiratory failure, hypertension, stage III chronic renal disease, anxiety/depression, gastroesophageal reflux disease who presented to the emergency department with chief complaint of worsening shortness of breath. She states she was feeling short of breath beginning yesterday. She worsened while she was at church, EMS was subsequently called the patient was brought into the emergency department. She was started on BiPAP, given Solu-Medrol, albuterol and Atrovent. She is being admitted to the intensive care unit for further management. She denied CP, fever, chills or sweats. Her home regiment included Trelegy with rescue short acting.  Past Medical History:  Diagnosis Date  . Angioedema   . Anxiety   . Anxiety and depression   . Chronic constipation   . Chronic kidney disease    stage 3  . COPD (chronic obstructive pulmonary disease) ([place])   . Gallstones   . GERD (gastroesophageal reflux disease)   . Hypertension   . Left ventricular hypertrophy   . Osteoarthritis   . Prurigo nodularis   . Tobacco abuse   . Vitamin D deficiency disease     Past Surgical History:  Procedure Laterality Date  . CHOLECYSTECTOMY    . 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 Alcohol abuse Mother   . Sickle cell anemia Daughter   . Hypertension Son   . Cancer Neg Hx   . COPD Neg Hx   . Diabetes Neg Hx   . Heart disease Neg Hx   . Stroke Neg Hx     Social History:  reports that she has quit smoking. Her smoking use included cigarettes. She has a 30.00 pack-year smoking  history. She has never used smokeless tobacco. She reports that she does not drink alcohol or use drugs.  Allergies:  Allergies  Allergen Reactions  . Bee Venom Swelling  . Enalapril Maleate Swelling    Medications: I have reviewed the patient's current medications.  Results for orders placed or performed during the hospital encounter of 09/30/17 (from the past 48 hour(s))  CBC with Differential/Platelet     Status: Abnormal   Collection Time: 09/30/17 12:35 PM  Result Value Ref Range   WBC 8.0 3.6 - 11.0 K/uL   RBC 3.96 3.80 - 5.20 MIL/uL   Hemoglobin 11.3 (L) 12.0 - 16.0 g/dL   HCT 33.9 (L) 35.0 - 47.0 %   MCV 85.5 80.0 - 100.0 fL   MCH 28.6 26.0 - 34.0 pg   MCHC 33.5 32.0 - 36.0 g/dL   RDW 18.6 (H) 11.5 - 14.5 %   Platelets 167 150 - 440 K/uL   Neutrophils Relative % 73 %   Neutro Abs 5.8 1.4 - 6.5 K/uL   Lymphocytes Relative 20 %   Lymphs Abs 1.6 1.0 - 3.6 K/uL   Monocytes Relative 7 %   Monocytes Absolute 0.5 0.2 - 0.9 K/uL   Eosinophils Relative 0 %   Eosinophils Absolute 0.0 0 - 0.7 K/uL   Basophils Relative 0 %   Basophils Absolute 0.0 0 - 0.1 K/uL    Comment: Performed at [facility], [address]  [place], [place] [address]  Comprehensive metabolic panel     Status: Abnormal   Collection Time: 09/30/17 12:35 PM  Result Value Ref Range   Sodium 143 135 - 145 mmol/L   Potassium 4.4 3.5 - 5.1 mmol/L   Chloride 102 98 - 111 mmol/L   CO2 32 22 - 32 mmol/L   Glucose, Bld 145 (H) 70 - 99 mg/dL   BUN 32 (H) 8 - 23 mg/dL   Creatinine, Ser 1.72 (H) 0.44 - 1.00 mg/dL   Calcium 8.8 (L) 8.9 - 10.3 mg/dL   Total Protein 6.4 (L) 6.5 - 8.1 g/dL   Albumin 3.2 (L) 3.5 - 5.0 g/dL   AST 17 15 - 41 U/L   ALT 22 0 - 44 U/L   Alkaline Phosphatase 67 38 - 126 U/L   Total Bilirubin 0.8 0.3 - 1.2 mg/dL   GFR calc non Af Amer 28 (L) >60 mL/min   GFR calc Af Amer 32 (L) >60 mL/min    Comment: (NOTE) The eGFR has been calculated using the CKD EPI equation. This  calculation has not been validated in all clinical situations. eGFR's persistently <60 mL/min signify possible Chronic Kidney Disease.    Anion gap 9 5 - 15    Comment: Performed at [facility], [address] [place], [place] [address]  Blood gas, venous     Status: None   Collection Time: 09/30/17 12:35 PM  Result Value Ref Range   pH, Ven 7.39 7.250 - 7.430   pCO2, Ven 59 44.0 - 60.0 mmHg   pO2, Ven 32.0 32.0 - 45.0 mmHg   Patient temperature 37.0    Collection site VENOUS    Sample type VENOUS     Comment: Performed at [facility], [address] [place], [place] [address]    Dg Chest Portable 1 View  Result Date: 09/30/2017 CLINICAL DATA:  Shortness of breath. EXAM: PORTABLE CHEST 1 VIEW COMPARISON:  September 19, 2017 FINDINGS: There is mild bibasilar atelectasis. There is no edema or consolidation. Heart is enlarged with pulmonary vascularity normal. There is aortic atherosclerosis. No evident adenopathy. No bone lesions. IMPRESSION: Mild bibasilar atelectasis. No edema or consolidation. Stable cardiac prominence. There is aortic atherosclerosis. Aortic Atherosclerosis (ICD10-I70.0). Electronically Signed   By: Lowella Grip III M.D.   On: 09/30/2017 13:45    ROS Blood pressure 122/78, pulse 93, temperature 98.7 F (37.1 C), temperature source Axillary, height 5' (1.524 m), weight 126.2 kg, SpO2 98 %. Physical Exam   Signs: Please see the above listed vital signs Patient is in elderly appearing African-American female, presently weaned off of BiPAP on nasal cannula HEENT: Trachea midline, no thyromegaly noted, no core bruits appreciated mild accessory muscle utilization Cardiovascular: Regular rate and rhythm Pulmonary: Diminished breath sounds, prolonged expiratory phase and wheezing, right greater than left Abdominal: Positive bowel sounds soft nontender Extremities: 2-3+ pitting edema appreciated Neurologic: No focal deficits appreciated, was all  extremities Cutaneous: Lower extremity changes venous stasis  Assessment/Plan:  Respiratory Failure. Patient with recurrent respiratory distress and respiratory failure. Agree with Solu-Medrol, albuterol, Atrovent, as needed BiPAP, antibiotic coverage.chest x-ray reveals prominent cardiac silhouette and vascular congestion, may perhaps have a component of edema. We'll also check BNP, EKG, troponins and empiric diuresis along with blood pressure control  Renal failure. Patient's BUN is 32 and creatinine 1.72 Will monitor closely  Hyperglycemia. On scale coverage. Glucose 187    Katherine [name] 09/30/2017, 3:18 PM

## 2017-09-30 NOTE — ED Notes (Signed)
Resp handed VBG at this time.

## 2017-09-30 NOTE — ED Notes (Signed)
.   Pt is resting, Respirations even and unlabored, NAD. Stretcher lowest postion and locked. Call bell within reach. Denies any needs at this time RN will continue to monitor.    

## 2017-09-30 NOTE — ED Triage Notes (Signed)
Pt arrives via [facility] for Resp Failure from home.   Pt presents on cpap Pt cant speak as she is SHOB and studdering. EDP at bedside.   Pt VSS  155/92 92 RA 115 hr  Pt has stuttering daughter says isnt normal pt stops when on BI PAP.

## 2017-09-30 NOTE — H&P (Signed)
[place] [facility] [place] NAME: Katherine [name]    MR#:  [id number]  DATE OF BIRTH:  10/04/1940  DATE OF ADMISSION:  09/30/2017  PRIMARY CARE PHYSICIAN: Valerie Roys, DO   REQUESTING/REFERRING PHYSICIAN:   CHIEF COMPLAINT:  Shortness of breath  HISTORY OF PRESENT ILLNESS:  Katherine [name]  is a 77 y.o. female with a known history of COPD with multiple readmissions for acute on chronic hypoxic respiratory failure, anxiety, chronic kidney disease stage III, GERD, hypertension and other medical problems is presenting to the ED with a chief complaint of worsening of shortness of breath.  Patient was feeling short of breath from yesterday according to the husband at bedside.  Today she went to church and the shortness of breath has gotten worse and EMS called and patient is brought into the ED.  Patient is placed on BiPAP and Solu-Medrol and breathing treatments were given.  Patient was desaturating while trying to wean off BiPAP.  Hospitalist team is called to admit the patient.  Husband Katherine [name] at bedside.  Reports that patient is compliant with her medications and breathing treatments  PAST MEDICAL HISTORY:   Past Medical History:  Diagnosis Date  . Angioedema   . Anxiety   . Anxiety and depression   . Chronic constipation   . Chronic kidney disease    stage 3  . COPD (chronic obstructive pulmonary disease) ([place])   . Gallstones   . GERD (gastroesophageal reflux disease)   . Hypertension   . Left ventricular hypertrophy   . Osteoarthritis   . Prurigo nodularis   . Tobacco abuse   . Vitamin D deficiency disease     PAST SURGICAL HISTOIRY:   Past Surgical History:  Procedure Laterality Date  . CHOLECYSTECTOMY    . 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   Procedure: TOTAL KNEE ARTHROPLASTY;  Surgeon: Hessie Knows, MD;  Location: [facility] ORS;  Service: Orthopedics;  Laterality: Right;    SOCIAL  HISTORY:   Social History   Tobacco Use  . Smoking status: Former Smoker    Packs/day: 0.50    Years: 60.00    Pack years: 30.00    Types: Cigarettes  . Smokeless tobacco: Never Used  Substance Use Topics  . Alcohol use: No    Alcohol/week: 0.0 standard drinks    Comment: rare    FAMILY HISTORY:   Family History  Problem Relation Age of Onset  . Alcohol abuse Mother   . Sickle cell anemia Daughter   . Hypertension Son   . Cancer Neg Hx   . COPD Neg Hx   . Diabetes Neg Hx   . Heart disease Neg Hx   . Stroke Neg Hx     DRUG ALLERGIES:   Allergies  Allergen Reactions  . Bee Venom Swelling  . Enalapril Maleate Swelling    REVIEW OF SYSTEMS:  Review of system unobtainable as the patient is on BiPAP  MEDICATIONS AT HOME:   Prior to Admission medications   Medication Sig Start Date End Date Taking? Authorizing Provider  acetaminophen (TYLENOL) 650 MG CR tablet Take 650-1,300 mg by mouth every 8 (eight) hours as needed for pain.    Yes [provider]  albuterol (ACCUNEB) 0.63 MG/3ML nebulizer solution Take 3 mLs (0.63 mg total) by nebulization 3 (three) times daily as needed for wheezing. DX:J44.9 07/23/17  Yes [name], MD  albuterol (PROVENTIL HFA)  108 (90 Base) MCG/ACT inhaler Inhale 2 puffs into the lungs every 4 (four) hours as needed for wheezing or shortness of breath. 05/30/17  Yes Wieting, Richard, MD  ALPRAZolam Duanne Moron) 0.5 MG tablet Take 1 tablet (0.5 mg total) by mouth 2 (two) times daily as needed for up to 15 doses for anxiety. 09/22/17  Yes Karmen Altamirano, Illene Silver, MD  atorvastatin (LIPITOR) 80 MG tablet Take 80 mg by mouth daily. 08/02/17  Yes [provider]  cetirizine (ZYRTEC) 10 MG tablet TAKE 1/2 TABLET(5 MG) BY MOUTH AT BEDTIME 09/24/17  Yes Pollak, Adriana M, PA-C  cholecalciferol (VITAMIN D) 1000 units tablet Take 1,000 Units by mouth daily.   Yes [provider]  clopidogrel (PLAVIX) 75 MG tablet Take 1 tablet (75 mg total) by  mouth daily. 09/05/17  Yes [name], NP  diltiazem (CARDIZEM CD) 120 MG 24 hr capsule Take 120 mg by mouth daily.   Yes [provider]  docusate sodium (COLACE) 100 MG capsule Take 1 capsule (100 mg total) by mouth daily. 09/22/17  Yes Maylani Embree, Illene Silver, MD  FLUoxetine (PROZAC) 10 MG capsule TAKE 1 CAPSULE(10 MG) BY MOUTH DAILY 07/25/17  Yes Johnson, Megan P, DO  Fluticasone-Umeclidin-Vilant (TRELEGY ELLIPTA) 100-62.5-25 MCG/INH AEPB Inhale 1 puff into the lungs daily. 08/09/17  Yes [name], MD  gabapentin (NEURONTIN) 300 MG capsule Take 1 capsule (300 mg total) by mouth at bedtime. 09/05/17  Yes [name], NP  hydrALAZINE (APRESOLINE) 25 MG tablet Take 1 tablet (25 mg total) by mouth 3 (three) times daily. 09/05/17  Yes [name], NP  ipratropium (ATROVENT) 0.03 % nasal spray Place 2 sprays into both nostrils every 12 (twelve) hours.   Yes [provider]  ipratropium-albuterol (DUONEB) 0.5-2.5 (3) MG/3ML SOLN USE 1 VIAL VIA NEBULIZER EVERY 6 HOURS AS NEEDED 07/23/17  Yes [name], MD  meclizine (ANTIVERT) 25 MG tablet Take 1 tablet (25 mg total) by mouth 2 (two) times daily as needed for dizziness. 09/22/17 10/22/17 Yes Voshon Petro, Illene Silver, MD  methocarbamol (ROBAXIN) 500 MG tablet Take 500 mg by mouth every 8 (eight) hours as needed for muscle spasms.  08/08/17  Yes [provider]  pantoprazole (PROTONIX) 40 MG tablet Take 1 tablet (40 mg total) by mouth daily. 09/05/17  Yes [name], NP  promethazine (PHENERGAN) 12.5 MG tablet Take 12.5 mg by mouth every 6 (six) hours as needed for nausea or vomiting.    Yes [provider]  senna (SENOKOT) 8.6 MG tablet Take 2 tablets by mouth at bedtime as needed for constipation.    Yes [provider]  tiotropium (SPIRIVA) 18 MCG inhalation capsule Place 18 mcg into inhaler and inhale daily.   Yes [provider]  predniSONE (STERAPRED UNI-PAK 21 TAB) 10 MG (21) TBPK tablet Take 1 tablet  (10 mg total) by mouth daily. Take 6 tablets by mouth for 1 day followed by  5 tablets by mouth for 1 day followed by  4 tablets by mouth for 1 day followed by  3 tablets by mouth for 1 day followed by  2 tablets by mouth for 1 day followed by  1 tablet by mouth for a day and stop Patient not taking: Reported on 09/30/2017 09/22/17   Nicholes Mango, MD      VITAL SIGNS:  Blood pressure 122/78, pulse 93, temperature 98.7 F (37.1 C), temperature source Axillary, height 5' (1.524 m), weight 126.2 kg, SpO2 98 %.  PHYSICAL EXAMINATION:  GENERAL:  77 y.o.-year-old  patient lying in the bed with no acute distress.  EYES: Pupils equal, round, reactive to light and accommodation. No scleral icterus. Extraocular muscles intact.  HEENT: Head atraumatic, normocephalic. Oropharynx and nasopharynx clear.  NECK:  Supple, no jugular venous distention. No thyroid enlargement, no tenderness.  LUNGS: Diminished breath sounds bilaterally, no wheezing, rales,rhonchi or crepitation. No use of accessory muscles of respiration.  On BiPAP CARDIOVASCULAR: S1, S2 normal. No murmurs, rubs, or gallops.  ABDOMEN: Soft, nontender, nondistended. Bowel sounds present. No organomegaly or mass.  EXTREMITIES: No pedal edema, cyanosis, or clubbing.  NEUROLOGIC: Cranial nerves II through XII are intact.  Patient has global weakness. Sensation intact. Gait not checked.  PSYCHIATRIC: The patient is alert and oriented x 3.  SKIN: No obvious rash, lesion, or ulcer.   LABORATORY PANEL:   CBC Recent Labs  Lab 09/30/17 1235  WBC 8.0  HGB 11.3*  HCT 33.9*  PLT 167   ------------------------------------------------------------------------------------------------------------------  Chemistries  Recent Labs  Lab 09/30/17 1235  NA 143  K 4.4  CL 102  CO2 32  GLUCOSE 145*  BUN 32*  CREATININE 1.72*  CALCIUM 8.8*  AST 17  ALT 22  ALKPHOS 67  BILITOT 0.8    ------------------------------------------------------------------------------------------------------------------  Cardiac Enzymes No results for input(s): TROPONINI in the last 168 hours. ------------------------------------------------------------------------------------------------------------------  RADIOLOGY:  Dg Chest Portable 1 View  Result Date: 09/30/2017 CLINICAL DATA:  Shortness of breath. EXAM: PORTABLE CHEST 1 VIEW COMPARISON:  September 19, 2017 FINDINGS: There is mild bibasilar atelectasis. There is no edema or consolidation. Heart is enlarged with pulmonary vascularity normal. There is aortic atherosclerosis. No evident adenopathy. No bone lesions. IMPRESSION: Mild bibasilar atelectasis. No edema or consolidation. Stable cardiac prominence. There is aortic atherosclerosis. Aortic Atherosclerosis (ICD10-I70.0). Electronically Signed   By: Lowella Grip III M.D.   On: 09/30/2017 13:45    EKG:   Orders placed or performed during the hospital encounter of 09/30/17  . EKG 12-Lead  . EKG 12-Lead    IMPRESSION AND PLAN:  Katherine [name]  is a 77 y.o. female with a known history of COPD with multiple readmissions for acute on chronic hypoxic respiratory failure, anxiety, chronic kidney disease stage III, GERD, hypertension and other medical problems is presenting to the ED with a chief complaint of worsening of shortness of breath.  Patient was feeling short of breath from yesterday according to the husband at bedside.  Today she went to church and the shortness of breath has gotten worse and EMS called and patient is brought into the ED.   #Acute on chronic respiratory failure with hypoxia secondary to COPD exacerbation with history of recurrent admissions at least 12 in the past 6 months Admit to stepdown unit Continue BiPAP and wean off as tolerated pulmonology consult placed Solu-Medrol, bronchodilator treatments COPD Gold protocol Doxycycline for empiric antibiotic  coverage Might consider palliative care consult if patient is agreeable Chest x-ray with atelectasis  #Hypertension-currently holding home medications as the patient is on BiPAP Hydralazine IV as needed  #History of anxiety Xanax is on hold will give Ativan as needed Resume home medication Prozac once patient is off BiPAP  #Chronic kidney disease stage III-avoid nephrotoxins and monitor renal function closely.  Currently home medications are on hold  #GERD-Pepcid IV  #Hyperlipidemia we will resume her home medication Lipitor once patient is tolerating diet and off BiPAP   All the records are reviewed and case discussed with ED provider. Management plans discussed with the patient, family and they are  in agreement.  CODE STATUS: fc   TOTAL Critical care TIME TAKING CARE OF THIS PATIENT: 43  minutes.   Note: This dictation was prepared with Dragon dictation along with smaller phrase technology. Any transcriptional errors that result from this process are unintentional.  Nicholes Mango M.D on 09/30/2017 at 2:43 PM  Between 7am to 6pm - Pager - [phone]  After 6pm go to [email or web address] - password [email or web address]  Office  [phone]  CC: Primary care physician; Valerie Roys, DO

## 2017-10-01 ENCOUNTER — Other Ambulatory Visit: Payer: Self-pay

## 2017-10-01 ENCOUNTER — Ambulatory Visit: Payer: Medicare HMO | Admitting: Pulmonary Disease

## 2017-10-01 ENCOUNTER — Ambulatory Visit: Payer: Medicare HMO

## 2017-10-01 ENCOUNTER — Encounter: Payer: Self-pay | Admitting: General Practice

## 2017-10-01 LAB — MAGNESIUM: Magnesium: 1.8 mg/dL (ref 1.7–2.4)

## 2017-10-01 LAB — BASIC METABOLIC PANEL
ANION GAP: 11 (ref 5–15)
ANION GAP: 14 (ref 5–15)
BUN: 41 mg/dL — ABNORMAL HIGH (ref 8–23)
BUN: 42 mg/dL — ABNORMAL HIGH (ref 8–23)
CALCIUM: 8.3 mg/dL — AB (ref 8.9–10.3)
CHLORIDE: 95 mmol/L — AB (ref 98–111)
CO2: 31 mmol/L (ref 22–32)
CO2: 29 mmol/L (ref 22–32)
Calcium: 8.4 mg/dL — ABNORMAL LOW (ref 8.9–10.3)
Chloride: 99 mmol/L (ref 98–111)
Creatinine, Ser: 2.07 mg/dL — ABNORMAL HIGH (ref 0.44–1.00)
Creatinine, Ser: 2.12 mg/dL — ABNORMAL HIGH (ref 0.44–1.00)
GFR calc non Af Amer: 22 mL/min — ABNORMAL LOW (ref >60)
GFR, EST AFRICAN AMERICAN: 26 mL/min — AB (ref >60)
GFR, EST AFRICAN AMERICAN: 25 mL/min — AB (ref >60)
GFR, EST NON AFRICAN AMERICAN: 22 mL/min — AB (ref >60)
GLUCOSE: 365 mg/dL — AB (ref 70–99)
Glucose, Bld: 315 mg/dL — ABNORMAL HIGH (ref 70–99)
POTASSIUM: 5.1 mmol/L (ref 3.5–5.1)
Potassium: 5.6 mmol/L — ABNORMAL HIGH (ref 3.5–5.1)
SODIUM: 141 mmol/L (ref 135–145)
Sodium: 138 mmol/L (ref 135–145)

## 2017-10-01 LAB — HEMOGLOBIN A1C
HEMOGLOBIN A1C: 7.6 % — AB (ref 4.8–5.6)
MEAN PLASMA GLUCOSE: 171.42 mg/dL

## 2017-10-01 LAB — GLUCOSE, CAPILLARY
GLUCOSE-CAPILLARY: 304 mg/dL — AB (ref 70–99)
Glucose-Capillary: 362 mg/dL — ABNORMAL HIGH (ref 70–99)
Glucose-Capillary: 230 mg/dL — ABNORMAL HIGH (ref 70–99)

## 2017-10-01 LAB — PHOSPHORUS: PHOSPHORUS: 5.2 mg/dL — AB (ref 2.5–4.6)

## 2017-10-01 MED ORDER — INSULIN ASPART 100 UNIT/ML ~~LOC~~ SOLN
3.0000 [IU] | Freq: Three times a day (TID) | SUBCUTANEOUS | Status: DC
  Administered 2017-10-01 – 2017-10-03 (×5): 3 [IU] via SUBCUTANEOUS
  Filled 2017-10-01 (×5): qty 1

## 2017-10-01 MED ORDER — INSULIN ASPART 100 UNIT/ML ~~LOC~~ SOLN
0.0000 [IU] | Freq: Three times a day (TID) | SUBCUTANEOUS | Status: DC
  Administered 2017-10-01: 9 [IU] via SUBCUTANEOUS
  Administered 2017-10-01: 18:00:00 7 [IU] via SUBCUTANEOUS
  Administered 2017-10-02 (×2): 9 [IU] via SUBCUTANEOUS
  Administered 2017-10-02: 3 [IU] via SUBCUTANEOUS
  Administered 2017-10-03: 2 [IU] via SUBCUTANEOUS
  Administered 2017-10-03: 13:00:00 5 [IU] via SUBCUTANEOUS
  Administered 2017-10-03: 17:00:00 3 [IU] via SUBCUTANEOUS
  Filled 2017-10-01 (×8): qty 1

## 2017-10-01 MED ORDER — INSULIN ASPART 100 UNIT/ML ~~LOC~~ SOLN
0.0000 [IU] | Freq: Every day | SUBCUTANEOUS | Status: DC
  Administered 2017-10-01: 2 [IU] via SUBCUTANEOUS
  Administered 2017-10-02: 22:00:00 5 [IU] via SUBCUTANEOUS
  Administered 2017-10-03: 2 [IU] via SUBCUTANEOUS
  Filled 2017-10-01 (×3): qty 1

## 2017-10-01 MED ORDER — GUAIFENESIN ER 600 MG PO TB12
600.0000 mg | ORAL_TABLET | Freq: Two times a day (BID) | ORAL | Status: DC
  Administered 2017-10-01 – 2017-10-03 (×5): 600 mg via ORAL
  Filled 2017-10-01 (×7): qty 1

## 2017-10-01 MED ORDER — METHYLPREDNISOLONE SODIUM SUCC 40 MG IJ SOLR
40.0000 mg | Freq: Two times a day (BID) | INTRAMUSCULAR | Status: DC
  Administered 2017-10-02 – 2017-10-04 (×5): 40 mg via INTRAVENOUS
  Filled 2017-10-01 (×5): qty 1

## 2017-10-01 MED ORDER — HEPARIN SODIUM (PORCINE) 5000 UNIT/ML IJ SOLN
5000.0000 [IU] | Freq: Three times a day (TID) | INTRAMUSCULAR | Status: DC
  Administered 2017-10-01 – 2017-10-03 (×8): 5000 [IU] via SUBCUTANEOUS
  Filled 2017-10-01 (×9): qty 1

## 2017-10-01 MED ORDER — SODIUM POLYSTYRENE SULFONATE 15 GM/60ML PO SUSP
15.0000 g | Freq: Once | ORAL | Status: AC
  Administered 2017-10-01: 15 g via ORAL
  Filled 2017-10-01: qty 60

## 2017-10-01 MED ORDER — ATORVASTATIN CALCIUM 80 MG PO TABS
80.0000 mg | ORAL_TABLET | Freq: Every day | ORAL | Status: DC
  Administered 2017-10-01 – 2017-10-03 (×3): 80 mg via ORAL
  Filled 2017-10-01 (×2): qty 4
  Filled 2017-10-01: qty 1
  Filled 2017-10-01: qty 4
  Filled 2017-10-01 (×2): qty 1

## 2017-10-01 NOTE — Progress Notes (Signed)
PT Cancellation Note  Patient Details Name: Katherine [name] MRN: [id number] DOB: 1940/10/30   Cancelled Treatment:    Reason Eval/Treat Not Completed: Medical issues which prohibited therapy(Potassium level 5.6).  Per protocol, will hold PT until pt more medically appropriate.   Collie Siad PT, DPT 10/01/2017, 10:59 AM

## 2017-10-01 NOTE — Progress Notes (Signed)
Nutrition Brief Note  RD received consult for assessment per COPD Gold protocol.  Wt Readings from Last 15 Encounters:  09/30/17 121.8 kg  09/26/17 124.3 kg  09/19/17 123.1 kg  09/11/17 122 kg  09/05/17 122 kg  08/28/17 120.9 kg  08/22/17 119.4 kg  08/09/17 122.5 kg  07/24/17 119.3 kg  07/07/17 119.4 kg  07/04/17 119.4 kg  06/28/17 112.9 kg  06/12/17 113.3 kg  06/05/17 114.3 kg  05/30/17 116.7 kg   Met with patient at bedside. She reports good appetite and intake. She typically eats 100% of three meals daily. Appetite is good here and she is eating 100% of meals. Reports her weight is stable. She takes vitamin D daily at home.  No subcutaneous fat or muscle wasting noted on NFPE.  Patient does not meet criteria for malnutrition.  Body mass index is 49.11 kg/m. Patient meets criteria for Obesity Class III (morbid obesity) based on current BMI.   Current diet order is heart healthy, patient is consuming approximately 100% of meals at this time. Labs and medications reviewed.   No nutrition interventions warranted at this time. If nutrition issues arise, please consult RD.   Willey Blade, MS, [place], LDN Office: [phone] Pager: [phone] After Hours/Weekend Pager: [phone]

## 2017-10-01 NOTE — Progress Notes (Signed)
OT Cancellation Note  Patient Details Name: Katherine [name] MRN: [id number] DOB: [birth year]   Cancelled Treatment:    Reason Eval/Treat Not Completed: Medical issues which prohibited therapy. Order received, chart reviewed. Pt noted with elevated potassium this am (5.6). Pt contraindicated for therapy at this time. Will continue to follow acutely and re-attempt OT evaluation as appropriate.   Jeni Salles, MPH, MS, OTR/L ascom [phone] 10/01/17, 8:12 AM

## 2017-10-01 NOTE — Care Management (Signed)
Patient's [facility] nurse case manager contacted this Inpatient case manager requesting palliative consult. MD paged.

## 2017-10-01 NOTE — Progress Notes (Signed)
Inpatient Diabetes Program Recommendations  AACE/ADA: New Consensus Statement on Inpatient Glycemic Control (2019)  Target Ranges:  Prepandial:   less than 140 mg/dL      Peak postprandial:   less than 180 mg/dL (1-2 hours)      Critically ill patients:  140 - 180 mg/dL   Results for Katherine [name], Katherine [name] (MRN [id number]) as of 10/01/2017 11:27  Ref. Range 09/30/2017 12:35 10/01/2017 05:00  Glucose Latest Ref Range: 70 - 99 mg/dL 145 (H) 315 (H)   Review of Glycemic Control  Diabetes history: No Outpatient Diabetes medications: NA Current orders for Inpatient glycemic control: Novolog 0-9 units TID with meals, Novolog 0-5 units QHS; Solumedrol 60 mg Q6H  Inpatient Diabetes Program Recommendations: Correction (SSI): Noted Novolog correction scale just ordered this morning to start at noon today. Insulin - Meal Coverage: If post prandial glucose is consistently over 180 mg/dl and steroids are continued, please consider ordering Novolog 3 units TID with meals for meal coverage if patient eats at least 50% of meals.  Thanks, Barnie Alderman, RN, MSN, CDE Diabetes Coordinator Inpatient Diabetes Program [phone] (Team Pager from 8am to 5pm)

## 2017-10-01 NOTE — Patient Outreach (Signed)
[facility]) Care Management  10/01/2017  Katherine [name] 01-Mar-1940 [id number]  Care Coordination: RN CM continues to follow patients progression towards discharge via EMR. RN CM discussed case with Marshell Garfinkel, [facility] Case Manager for discharge needs and palliative care consult as discussed by ED and Admitting provider. [place] CM will discuss with attending provider and request palliative care consult prior to discharge.   Waddell Iten E. Rollene Rotunda RN, BSN [facility] Care Management Coordinator [phone]

## 2017-10-01 NOTE — Progress Notes (Signed)
Telephone report called to [facility], RN.  Patient moved from ICU bed 2 to room 119 via bed with Nursing assistant present.

## 2017-10-01 NOTE — Progress Notes (Addendum)
[place] at [place] NAME: Katherine [name]    MR#:  [id number]  DATE OF BIRTH:  09-01-1940  SUBJECTIVE: admitted  for COPD exacerbation, transiently on BiPAP so initially she was in stepdown but weaned off of the BiPAP, now on nasal cannula.  Complains of shortness of breath, cough, unable to get the phlegm out.  CHIEF COMPLAINT:   Chief Complaint  Patient presents with  . Respiratory Distress    REVIEW OF SYSTEMS:   ROS CONSTITUTIONAL: No fever, fatigue or weakness.  EYES: No blurred or double vision.  EARS, NOSE, AND THROAT: No tinnitus or ear pain.  RESPIRATORY: N mild shortness of breath.  CARDIOVASCULAR: No chest pain, orthopnea, edema.  GASTROINTESTINAL: No nausea, vomiting, diarrhea or abdominal pain.  GENITOURINARY: No dysuria, hematuria.  ENDOCRINE: No polyuria, nocturia,  HEMATOLOGY: No anemia, easy bruising or bleeding SKIN: No rash or lesion. MUSCULOSKELETAL: No joint pain or arthritis.   NEUROLOGIC: No tingling, numbness, weakness.  PSYCHIATRY: No anxiety or depression.   DRUG ALLERGIES:   Allergies  Allergen Reactions  . Bee Venom Swelling  . Enalapril Maleate Swelling    VITALS:  Blood pressure 133/75, pulse 100, temperature 98.4 F (36.9 C), temperature source Oral, resp. rate (!) 24, height 5\' 2"  (1.575 m), weight 120.9 kg, SpO2 96 %.  PHYSICAL EXAMINATION:  GENERAL:  77 y.o.-year-old patient lying in the bed with no acute distress.  EYES: Pupils equal, round, reactive to light and accommodation. No scleral icterus. Extraocular muscles intact.  HEENT: Head atraumatic, normocephalic. Oropharynx and nasopharynx clear.  NECK:  Supple, no jugular venous distention. No thyroid enlargement, no tenderness.  LUNGS: Faint expiratory wheeze.  Respiration.  CARDIOVASCULAR: S1, S2 normal. No murmurs, rubs, or gallops.  ABDOMEN: Soft, nontender, nondistended. Bowel sounds present. No organomegaly or mass.   EXTREMITIES: No pedal edema, cyanosis, or clubbing.  NEUROLOGIC: Cranial nerves II through XII are intact. Muscle strength 5/5 in all extremities. Sensation intact. Gait not checked.  PSYCHIATRIC: The patient is alert and oriented x 3.  SKIN: No obvious rash, lesion, or ulcer.    LABORATORY PANEL:   CBC Recent Labs  Lab 09/30/17 1235  WBC 8.0  HGB 11.3*  HCT 33.9*  PLT 167   ------------------------------------------------------------------------------------------------------------------  Chemistries  Recent Labs  Lab 09/30/17 1235 10/01/17 0500 10/01/17 1347  NA 143 141 138  K 4.4 5.6* 5.1  CL 102 99 95*  CO2 32 31 29  GLUCOSE 145* 315* 365*  BUN 32* 41* 42*  CREATININE 1.72* 2.07* 2.12*  CALCIUM 8.8* 8.4* 8.3*  MG  --  1.8  --   AST 17  --   --   ALT 22  --   --   ALKPHOS 67  --   --   BILITOT 0.8  --   --    ------------------------------------------------------------------------------------------------------------------  Cardiac Enzymes Recent Labs  Lab 09/30/17 1751  TROPONINI 0.03*   ------------------------------------------------------------------------------------------------------------------  RADIOLOGY:  Dg Chest Portable 1 View  Result Date: 09/30/2017 CLINICAL DATA:  Shortness of breath. EXAM: PORTABLE CHEST 1 VIEW COMPARISON:  September 19, 2017 FINDINGS: There is mild bibasilar atelectasis. There is no edema or consolidation. Heart is enlarged with pulmonary vascularity normal. There is aortic atherosclerosis. No evident adenopathy. No bone lesions. IMPRESSION: Mild bibasilar atelectasis. No edema or consolidation. Stable cardiac prominence. There is aortic atherosclerosis. Aortic Atherosclerosis (ICD10-I70.0). Electronically Signed   By: Lowella Grip III M.D.   On: 09/30/2017 13:45  EKG:   Orders placed or performed during the hospital encounter of 09/30/17  . EKG 12-Lead  . EKG 12-Lead    ASSESSMENT AND PLAN:   Acute  On Chronic  respiratory failure with COPD elevation: Initially required BiPAP, no change to nasal cannula, out of ICU.  Patient has recurrent admissions for COPD flare has been admitted m at least 12 times in the last 6 months.  Continue bronchodilators, IV steroids.,  Patient states that she has cough and unable to get the phlegm out, add Mucinex. 2.  Essential hypertension: Continue 3 anxiety: Continue Xanax  #4 CKD stage III: Worsening kidney function, monitor closely.  5.  Mild hyperkalemia, potassium decreased from 5.6-5.1.,  Received Kayexalate. #6 hyperglycemia likely due to steroids, steroids, check hemoglobin A1c, start insulin with sliding scale with coverage.  All the records are reviewed and case discussed with Care Management/Social Workerr. Management plans discussed with the patient, family and they are in agreement.  CODE STATUS: Full code  TOTAL TIME TAKING CARE OF THIS PATIENT: 60minutes.  50% time spent in counseling, coordination of care.   POSSIBLE D/C IN 1-2DAYS, DEPENDING ON CLINICAL CONDITION.   Epifanio Lesches M.D on 10/01/2017 at 2:46 PM  Between 7am to 6pm - Pager - [phone]  After 6pm go to [email or web address] - password [email or web address] [place] [facility]  Office  [phone]  CC: Primary care physician; Valerie Roys, DO   Note: This dictation was prepared with Dragon dictation along with smaller phrase technology. Any transcriptional errors that result from this process are unintentional.

## 2017-10-01 NOTE — Progress Notes (Signed)
Follow up - Critical Care Medicine Note  Patient Details:    Katherine [name] is an 78 y.o. female.with a past medical history remarkable for COPD with chronic respiratory failure, hypertension, stage III chronic renal disease, anxiety/depression, gastroesophageal reflux disease who presented to the emergency department with chief complaint of worsening shortness of breath. She states she was feeling short of breath beginning yesterday. She worsened while she was at church, EMS was subsequently called the patient was brought into the emergency department. She was started on BiPAP, given Solu-Medrol, albuterol and Atrovent.   Lines, Airways, Drains: External Urinary Catheter (Active)  Collection Container Dedicated Suction Canister 10/01/2017  1:30 AM  Securement Method Tape 10/01/2017  1:30 AM  Output (mL) 300 mL 10/01/2017  2:00 AM    Anti-infectives:  Anti-infectives (From admission, onward)   Start     Dose/Rate Route Frequency Ordered Stop   09/30/17 1500  doxycycline (VIBRA-TABS) tablet 100 mg     100 mg Oral Every 12 hours 09/30/17 1442        Microbiology: Results for orders placed or performed during the hospital encounter of 09/30/17  MRSA PCR Screening     Status: None   Collection Time: 09/30/17  3:49 PM  Result Value Ref Range Status   MRSA by PCR NEGATIVE NEGATIVE Final    Comment:        The GeneXpert MRSA Assay (FDA approved for NASAL specimens only), is one component of a comprehensive MRSA colonization surveillance program. It is not intended to diagnose MRSA infection nor to guide or monitor treatment for MRSA infections. Performed at [facility], [place], [address] [place], [place] [address]     Studies: Dg Chest 1 View  Result Date: 09/19/2017 CLINICAL DATA:  Acute onset respiratory distress up on walking. EXAM: CHEST  1 VIEW COMPARISON:  September 11, 2017 FINDINGS: The heart size and mediastinal contours are stable. The heart size is enlarged. There is  mild diffuse increased pulmonary interstitium bilaterally. Mild atelectasis versus scar of both lung bases are noted. There is no focal pneumonia or pleural effusion. The visualized skeletal structures are unremarkable. IMPRESSION: Mild congestive heart failure. Mild atelectasis versus scar of bilateral lung bases. Electronically Signed   By: Abelardo Diesel M.D.   On: 09/19/2017 09:58   Dg Chest Portable 1 View  Result Date: 09/30/2017 CLINICAL DATA:  Shortness of breath. EXAM: PORTABLE CHEST 1 VIEW COMPARISON:  September 19, 2017 FINDINGS: There is mild bibasilar atelectasis. There is no edema or consolidation. Heart is enlarged with pulmonary vascularity normal. There is aortic atherosclerosis. No evident adenopathy. No bone lesions. IMPRESSION: Mild bibasilar atelectasis. No edema or consolidation. Stable cardiac prominence. There is aortic atherosclerosis. Aortic Atherosclerosis (ICD10-I70.0). Electronically Signed   By: Lowella Grip III M.D.   On: 09/30/2017 13:45   Dg Chest Portable 1 View  Result Date: 09/11/2017 CLINICAL DATA:  Chest pain.  Shortness of breath. EXAM: PORTABLE CHEST 1 VIEW COMPARISON:  08/27/2017.  04/05/2017.  11/28/2015. FINDINGS: Cardiomegaly with normal pulmonary vascularity. Low lung volumes with mild bibasilar atelectasis/infiltrates. A component of scarring may be present. No pleural effusion or pneumothorax. IMPRESSION: 1. Low lung volumes with mild bibasilar atelectasis/infiltrates. A component of bibasilar scarring may be present. Similar findings noted on prior exam. 2.  Cardiomegaly.  No pulmonary venous congestion. Electronically Signed   By: Marcello Moores  Register   On: 09/11/2017 14:57    Consults: Treatment Team:  Pccm, Ander Gaster, MD Hermelinda Dellen, DO   Subjective:  Overnight Issues:  Overnight she did well. She was immediately weaned off BiPAP upon arrival to the intensive care unit. Presently awake and alert and having no complaints  Objective:  Vital  signs for last 24 hours: Temp:  [97.6 F (36.4 C)-98.7 F (37.1 C)] 98.6 F (37 C) (08/19 0137) Pulse Rate:  [70-96] 70 (08/19 0500) Resp:  [15-32] 19 (08/19 0500) BP: (122-162)/(72-114) 145/72 (08/19 0500) SpO2:  [91 %-100 %] 95 % (08/19 0500) Weight:  [121.8 kg-126.2 kg] 121.8 kg (08/18 1548)  Hemodynamic parameters for last 24 hours:    Intake/Output from previous day: 08/18 0701 - 08/19 0700 In: 1320 [P.O.:1320] Out: 1300 [Urine:1300]  Intake/Output this shift: No intake/output data recorded.  Vent settings for last 24 hours:    Physical Exam:   Resting Comfortably on nasal cannula HEENT:           Trachea midline, no thyromegaly noted, no core bruits appreciated mild accessory muscle utilization Cardiovascular:           Regular rate and rhythm Pulmonary:      Diminished breath sounds, prolonged expiratory phase and wheezing, right greater than left Abdominal:      Positive bowel sounds soft nontender Extremities:     2-3+ pitting edema appreciated Neurologic:      No focal deficits appreciated, was all extremities Cutaneous:      Lower extremity changes venous stasis  Assessment/Plan:  Respiratory Failure. Patient with recurrent respiratory distress and respiratory failure. Doing well on Solu-Medrol, albuterol, Atrovent,and nasal cannula. At this point patient is stable for floor transfer  Hyperkalemia will repeat BMP this afternoon. Will give dose of Kayexalate. No hyperacute T waves noted  Renal failure. Patient with renal insufficiency, BUN is 41/creatinine 2.07 acute on chronic. We'll hold on any additional diuresis  Hyperglycemia. On scale coverage. Glucose 315  Troponin 0.03, most likely reflects supply demand ischemia  Hermelinda Dellen, DO

## 2017-10-01 NOTE — Plan of Care (Signed)

## 2017-10-02 ENCOUNTER — Ambulatory Visit: Payer: Medicare HMO | Admitting: Physician Assistant

## 2017-10-02 LAB — BASIC METABOLIC PANEL
ANION GAP: 8 (ref 5–15)
BUN: 46 mg/dL — ABNORMAL HIGH (ref 8–23)
CALCIUM: 8.3 mg/dL — AB (ref 8.9–10.3)
CO2: 31 mmol/L (ref 22–32)
CREATININE: 1.87 mg/dL — AB (ref 0.44–1.00)
Chloride: 101 mmol/L (ref 98–111)
GFR calc non Af Amer: 25 mL/min — ABNORMAL LOW (ref >60)
GFR, EST AFRICAN AMERICAN: 29 mL/min — AB (ref >60)
Glucose, Bld: 317 mg/dL — ABNORMAL HIGH (ref 70–99)
Potassium: 5.1 mmol/L (ref 3.5–5.1)
SODIUM: 140 mmol/L (ref 135–145)

## 2017-10-02 LAB — GLUCOSE, CAPILLARY
GLUCOSE-CAPILLARY: 357 mg/dL — AB (ref 70–99)
GLUCOSE-CAPILLARY: 364 mg/dL — AB (ref 70–99)
GLUCOSE-CAPILLARY: 238 mg/dL — AB (ref 70–99)
Glucose-Capillary: 336 mg/dL — ABNORMAL HIGH (ref 70–99)

## 2017-10-02 LAB — HIV ANTIBODY (ROUTINE TESTING W REFLEX): HIV Screen 4th Generation wRfx: NONREACTIVE

## 2017-10-02 MED ORDER — BUDESONIDE 0.25 MG/2ML IN SUSP: 0.2500 mg | Freq: Two times a day (BID) | RESPIRATORY_TRACT | Status: DC

## 2017-10-02 MED ORDER — BUDESONIDE 0.25 MG/2ML IN SUSP
0.2500 mg | Freq: Two times a day (BID) | RESPIRATORY_TRACT | Status: DC
  Administered 2017-10-02 – 2017-10-03 (×3): 0.25 mg via RESPIRATORY_TRACT
  Filled 2017-10-02 (×3): qty 2

## 2017-10-02 MED ORDER — INSULIN GLARGINE 100 UNIT/ML ~~LOC~~ SOLN
10.0000 [IU] | Freq: Every day | SUBCUTANEOUS | Status: DC
Start: 2017-10-02 — End: 2017-10-03
  Administered 2017-10-02: 10 [IU] via SUBCUTANEOUS
  Filled 2017-10-02 (×2): qty 0.1

## 2017-10-02 NOTE — Progress Notes (Addendum)
Inpatient Diabetes Program Recommendations  AACE/ADA: New Consensus Statement on Inpatient Glycemic Control (2019)  Target Ranges:  Prepandial:   less than 140 mg/dL      Peak postprandial:   less than 180 mg/dL (1-2 hours)      Critically ill patients:  140 - 180 mg/dL   Results for [name], Katherine [name] (MRN [id number]) as of 10/02/2017 08:53  Ref. Range 10/01/2017 12:50 10/01/2017 17:06 10/01/2017 20:05 10/02/2017 07:47  Glucose-Capillary Latest Ref Range: 70 - 99 mg/dL 362 (H) 304 (H) 230 (H) 357 (H)  Results for [name], Katherine [name] (MRN [id number]) as of 10/02/2017 08:53  Ref. Range 06/29/2017 05:43 09/12/2017 03:39 10/01/2017 15:07  Hemoglobin A1C Latest Ref Range: 4.8 - 5.6 % 6.5 (H) 6.8 (H) 7.6 (H)   Review of Glycemic Control  Diabetes history: No Outpatient Diabetes medications: NA Current orders for Inpatient glycemic control: Novolog 0-9 units TID with meals, Novolog 0-5 units QHS, Novolog 3 units TID with meals; Solumedrol 40 mg Q12H  Inpatient Diabetes Program Recommendations: Insulin-Basal: Please consider ordering Lantus 10 units x 1 now. Then re-evaluate daily while ordered steroids to determine if basal insulin is needed.  NOTE: Noted A1Cof 7.6% on 10/01/17. Patient does not have a history of DM and per ADA, if A1C 6.5% or greater then criteria met to dx with DM. However, patient has 10 hospital admission in the past 6 months and patient has been receiving IV steroids and Prednisone during and following admissions.  Patient is currently ordered Solumedrol40 mg Q12H.Recent steroids arecontributing to elevated A1Cand noted hyperglycemia. MD, please note if patient will be newly dx with DM. If so, please inform patient and nursing staff so that patient can be educated on DM by bedside nursing.  Thanks, Barnie Alderman, RN, MSN, CDE Diabetes Coordinator Inpatient Diabetes Program [phone] (Team Pager from 8am to 5pm)

## 2017-10-02 NOTE — Clinical Social Work Note (Signed)
Clinical Social Work Assessment  Patient Details  Name: Katherine [name] MRN: [id number] Date of Birth: 31-Mar-1940  Date of referral:  10/02/17               Reason for consult:  Facility Placement                Permission sought to share information with:  Case Manager, Customer service manager, Family Supports Permission granted to share information::  Yes, Verbal Permission Granted  Name::        Agency::     Relationship::     Contact Information:     Housing/Transportation Living arrangements for the past 2 months:  Single Family Home Source of Information:  Patient Patient Interpreter Needed:  None Criminal Activity/Legal Involvement Pertinent to Current Situation/Hospitalization:  No - Comment as needed Significant Relationships:  Spouse Lives with:  Spouse Do you feel safe going back to the place where you live?  Yes Need for family participation in patient care:  No (Coment)  Care giving concerns:  Patient lives with her husband in [place]    Social Worker assessment / plan:  CSW consulted for SNF placement. CSW met with patient to discuss discharge plan. CSW introduced self and explained role. Patient states that she lives with her husband and he helps take care of her. CSW explained PT recommendation of SNF. Patient adamantly stated that she will not go to a facility because they take her money. Patient repeatedly stated "I am not going to rehab!". CSW explained that she does not have to go to rehab and can receive home health services in her home. Patient is agreeable to that. CSW made RN CM aware of this. CSW signing off. Please re consult if further needs arise.   Employment status:  Retired Nurse, adult PT Recommendations:  [place] / Referral to community resources:  [place]  Patient/Family's Response to care:  Patient thanked CSW for assistance   Patient/Family's Understanding of and  Emotional Response to Diagnosis, Current Treatment, and Prognosis:  Patient states understanding why she is here and is refusing SNF placement.   Emotional Assessment Appearance:  Appears stated age Attitude/Demeanor/Rapport:  Guarded Affect (typically observed):  Blunt, Restless Orientation:  Oriented to Self, Oriented to Place, Oriented to  Time, Oriented to Situation Alcohol / Substance use:  Not Applicable Psych involvement (Current and /or in the community):  No (Comment)  Discharge Needs  Concerns to be addressed:  Discharge Planning Concerns Readmission within the last 30 days:  No Current discharge risk:  None Barriers to Discharge:  Continued Medical Work up   [employer], [place] 10/02/2017, 4:59 PM

## 2017-10-02 NOTE — Progress Notes (Signed)
[place] [facility] [place] NAME: Katherine [name]    MR#:  [id number]  DATE OF BIRTH:  [birth year]  Shortness of breath better but unable to get the phlegm out.  CHIEF COMPLAINT:   Chief Complaint  Patient presents with  . Respiratory Distress    REVIEW OF SYSTEMS:   ROS CONSTITUTIONAL: No fever, fatigue or weakness.  EYES: No blurred or double vision.  EARS, NOSE, AND THROAT: No tinnitus or ear pain.  RESPIRATORY:  mild shortness of breath.  CARDIOVASCULAR: No chest pain, orthopnea, edema.  GASTROINTESTINAL: No nausea, vomiting, diarrhea or abdominal pain.  GENITOURINARY: No dysuria, hematuria.  ENDOCRINE: No polyuria, nocturia,  HEMATOLOGY: No anemia, easy bruising or bleeding SKIN: No rash or lesion. MUSCULOSKELETAL: No joint pain or arthritis.   NEUROLOGIC: No tingling, numbness, weakness.  PSYCHIATRY: No anxiety or depression.   DRUG ALLERGIES:   Allergies  Allergen Reactions  . Bee Venom Swelling  . Enalapril Maleate Swelling    VITALS:  Blood pressure 112/74, pulse 88, temperature 98.3 F (36.8 C), temperature source Oral, resp. rate 20, height 5\' 2"  (1.575 m), weight 121.6 kg, SpO2 90 %.  PHYSICAL EXAMINATION:  GENERAL:  77 y.o.-year-old patient lying in the bed with no acute distress.  EYES: Pupils equal, round, reactive to light and accommodation. No scleral icterus. Extraocular muscles intact.  HEENT: Head atraumatic, normocephalic. Oropharynx and nasopharynx clear.  NECK:  Supple, no jugular venous distention. No thyroid enlargement, no tenderness.  LUNGS: Faint expiratory wheeze.  Respiration.  CARDIOVASCULAR: S1, S2 normal. No murmurs, rubs, or gallops.  ABDOMEN: Soft, nontender, nondistended. Bowel sounds present. No organomegaly or mass.  EXTREMITIES: No pedal edema, cyanosis, or clubbing.  NEUROLOGIC: Cranial nerves II through XII are intact. Muscle strength 5/5 in all extremities. Sensation intact. Gait not  checked.  PSYCHIATRIC: The patient is alert and oriented x 3.  SKIN: No obvious rash, lesion, or ulcer.    LABORATORY PANEL:   CBC Recent Labs  Lab 09/30/17 1235  WBC 8.0  HGB 11.3*  HCT 33.9*  PLT 167   ------------------------------------------------------------------------------------------------------------------  Chemistries  Recent Labs  Lab 09/30/17 1235 10/01/17 0500  10/02/17 0509  NA 143 141   < > 140  K 4.4 5.6*   < > 5.1  CL 102 99   < > 101  CO2 32 31   < > 31  GLUCOSE 145* 315*   < > 317*  BUN 32* 41*   < > 46*  CREATININE 1.72* 2.07*   < > 1.87*  CALCIUM 8.8* 8.4*   < > 8.3*  MG  --  1.8  --   --   AST 17  --   --   --   ALT 22  --   --   --   ALKPHOS 67  --   --   --   BILITOT 0.8  --   --   --    < > = values in this interval not displayed.   ------------------------------------------------------------------------------------------------------------------  Cardiac Enzymes Recent Labs  Lab 09/30/17 1751  TROPONINI 0.03*   ------------------------------------------------------------------------------------------------------------------  RADIOLOGY:  No results found.  EKG:   Orders placed or performed during the hospital encounter of 09/30/17  . EKG 12-Lead  . EKG 12-Lead    ASSESSMENT AND PLAN:   Acute  On Chronic respiratory failure with COPD exacerbation: Initially required BiPAP, now change to nasal cannula, out of ICU.  Patient has recurrent admissions for  COPD flare has been admitted  at least 12 times in the last 6 months.  Continue bronchodilators, IV steroids.,   added Mucinex yesterday.   2.  Essential hypertension: controlled. 3 anxiety: Continue Xanax  #4 CKD stage III: Kidney function slightly better than yesterday.   5.  Mild hyperkalemia, potassium decreased from 5.6-5.1.,  Received Kayexalate. #6 .diabetes mellitus type 2: Continue sliding scale insulin with coverage, add Lantus 10 units daily.  Continue sliding scale  insulin with coverage.  Seen by diabetic nurse, patient needs diabetic teaching, start on ADA diet 7.  Morbid obesity: Counseled about weight loss. All the records are reviewed and case discussed with Care Management/Social Workerr. Management plans discussed with the patient, family and they are in agreement.  CODE STATUS: Full code  TOTAL TIME TAKING CARE OF THIS PATIENT: 87minutes.  50% time spent in counseling, coordination of care.   POSSIBLE D/C IN 1-2DAYS, DEPENDING ON CLINICAL CONDITION.   Epifanio Lesches M.D on 10/02/2017 at 1:29 PM  Between 7am to 6pm - Pager - [phone]  After 6pm go to [email or web address] - password [email or web address] [place] [facility]  Office  [phone]  CC: Primary care physician; Valerie Roys, DO   Note: This dictation was prepared with Dragon dictation along with smaller phrase technology. Any transcriptional errors that result from this process are unintentional.

## 2017-10-02 NOTE — Evaluation (Signed)
Physical Therapy Evaluation Patient Details Name: Yaniris Braddock MRN: [id number] DOB: Jan 14, 1941 Today's Date: 10/02/2017   History of Present Illness  Delbert Burgueno is an 77 y.o. female.with a past medical history of COPD with chronic respiratory failure, hypertension, stage III chronic renal disease, anxiety/depression, gastroesophageal reflux disease who presented to the emergency department with chief complaint of worsening shortness of breath.  She was started on BiPAP and medicated in ICU. Now stable, she was moved out of ICU and transitioned to nasal cannula.  Clinical Impression  On PT arrival pt was sleeping and lethargic. Pt was able to wake up and become more alert and agreeable to therapy. Pt is HOH and so details collected regarding home information/prior level of function are inconsistent at times with reports from past hospital visits. Need to continue to clarify if pt has steps and railings into home at future sessions. Pt has frequent coughing fits and becomes SOB with bed exercises and bed mobility. Pt is generally weak through BUE and BLE. Pt reports she does not move much when at home and sleeps most of day, but that she is able to get around w/ [facility] for household distances at baseline. At the time of this evaluation pt needed min assist and RW for transfers, standing balance, and ambulation. Pt is unstable w/o RW, and needs min assist with bed mobility and sit to/from stand activities. Pt activity tolerance is decreased, SpO2 levels dropping to 85% on 3L O2 [place] and HR increases to 115 with 15 ft ambulation w/RW. Pt SpO2 levels do return to 95% on 3l O2 and HR returns to baseline of 85 with 2 min sitting and deep  Breathing, but pt is highly fatigued from activity. Pt is requiring more exogenous O2 than at her baseline, needs higher level of assistance with mobility, and is demonstrating decreased balance as compared to recent hospital encounters (as recent as 1 week ago). Would benefit  from skilled PT to address above deficits and promote optimal return to PLOF. Recommend transition to STR upon discharge from acute hospitalization to address functional limitations and to decrease likelihood of readmission.     Follow Up Recommendations SNF    Equipment Recommendations  None recommended by PT    Recommendations for Other Services       Precautions / Restrictions Precautions Precautions: Fall Precaution Comments: reports hx of falling multiple times Restrictions Weight Bearing Restrictions: No      Mobility  Bed Mobility Overal bed mobility: Needs assitance Bed Mobility: Supine to Sit     Supine to sit: Min assist     General bed mobility comments: Uses bed rail and HOB elevated. Pt reached for PT hadn to complete movement. Pt gets very SOB w/ activity  Transfers Overall transfer level: Needs assistance Equipment used: Rolling walker (2 wheeled) Transfers: Sit to/from Stand Sit to Stand: Min assist         General transfer comment: Pt needs min assist due to decreased abilty to bring trunk upright. Hands unsteady on walker .   Ambulation/Gait Ambulation/Gait assistance: Min assist Gait Distance (Feet): 15 Feet Assistive device: Rolling walker (2 wheeled)   Gait velocity: decreased   General Gait Details: Pt has forward flexed positon, decreased step length and lack of hip flexion/toe bilat. Pt has very labored breathing w/ activity and SpO2 decreeases to 85% at 10 ft mark. Pt noted to be holding breath and needed cues to properly breathe  Stairs  Wheelchair Mobility    Modified Rankin (Stroke Patients Only)       Balance Overall balance assessment: Needs assistance Sitting-balance support: Single extremity supported;Feet supported Sitting balance-Leahy Scale: Fair Sitting balance - Comments: Pt has dififculty staying upright due to body habitus and increased SOB Postural control: Posterior lean Standing balance support:  No upper extremity supported Standing balance-Leahy Scale: Fair Standing balance comment: Pt needs BUE on RW for safe balance. Is unable to accept mod perterbations w/o reachign hands to RW for support               High Level Balance Comments: Pt weigth shifted side to side, and needed BUE on RW to prevent LOB             Pertinent Vitals/Pain Pain Assessment: No/denies pain    Home Living Family/patient expects to be discharged to:: Private residence Living Arrangements: Spouse/significant other Available Help at Discharge: Family;Available 24 hours/day Type of Home: House Home Access: Stairs to enter(Per chart review--Pt has recently said she has no stairs and reproted to OT today she has none. Will clarify with pt next session) Entrance Stairs-Rails: None Entrance Stairs-Number of Steps: 3 Home Layout: One level Home Equipment: Walker - 2 wheels;Cane - single point      Prior Function Level of Independence: Needs assistance   Gait / Transfers Assistance Needed: Walking mostly household distances with SPC.  RW occassionally.   ADL's / Homemaking Assistance Needed: Husband assists with bathing, cleaning, cooking and toileting  Comments: Home O2 @ 2L     Hand Dominance   Dominant Hand: Right    Extremity/Trunk Assessment   Upper Extremity Assessment Upper Extremity Assessment: Generalized weakness(brace on L wrist--pt reports she has pain)    Lower Extremity Assessment Lower Extremity Assessment: Generalized weakness    Cervical / Trunk Assessment Cervical / Trunk Assessment: Kyphotic  Communication   Communication: HOH  Cognition Arousal/Alertness: Awake/alert Behavior During Therapy: WFL for tasks assessed/performed Overall Cognitive Status: Within Functional Limits for tasks assessed                                 General Comments: A&O x4.  Follows commands consistently.      General Comments      Exercises General Exercises -  Lower Extremity Ankle Circles/Pumps: AROM;20 reps;Both;Supine Heel Slides: AROM;10 reps;Both;Supine Straight Leg Raises: 10 reps;Both;AROM Other Exercises Other Exercises: Bed mobility; Supine ot sit min assist; Transfers: Sit to stadn w/RW min assist; Ambulation 15 ft min assist w/RW   Assessment/Plan    PT Assessment Patient needs continued PT services  PT Problem List Decreased strength;Decreased mobility;Decreased balance;Decreased knowledge of use of DME;Decreased activity tolerance;Decreased range of motion       PT Treatment Interventions DME instruction;Therapeutic activities;Gait training;Therapeutic exercise;Balance training;Functional mobility training;Patient/family education;Stair training    PT Goals (Current goals can be found in the Care Plan section)  Acute Rehab PT Goals Patient Stated Goal: To go home     Frequency Min 2X/week   Barriers to discharge Other (comment) decreased pt actvitity tolerance     Co-evaluation               AM-PAC PT "6 Clicks" Daily Activity  Outcome Measure Difficulty turning over in bed (including adjusting bedclothes, sheets and blankets)?: A Little Difficulty moving from lying on back to sitting on the side of the bed? : Unable Difficulty sitting down on and standing up  from a chair with arms (e.g., wheelchair, bedside commode, etc,.)?: Unable Help needed moving to and from a bed to chair (including a wheelchair)?: A Lot Help needed walking in hospital room?: A Little Help needed climbing 3-5 steps with a railing? : A Lot 6 Click Score: 12    End of Session Equipment Utilized During Treatment: Gait belt;Oxygen Activity Tolerance: Patient limited by fatigue Patient left: with call bell/phone within reach;in chair;with chair alarm set Nurse Communication: Mobility status PT Visit Diagnosis: Unsteadiness on feet (R26.81);History of falling (Z91.81);Muscle weakness (generalized) (M62.81)    Time: 2725-3664 PT Time  Calculation (min) (ACUTE ONLY): 28 min   Charges:              Hortencia Conradi, SPT 10/02/17,10:51 AM

## 2017-10-02 NOTE — Evaluation (Signed)
Occupational Therapy Evaluation Patient Details Name: Katherine [name] MRN: [id number] DOB: 09-04-1940 Today's Date: 10/02/2017    History of Present Illness [name] is an 77 y.o. female.with a past medical history of COPD with chronic respiratory failure, hypertension, stage III chronic renal disease, anxiety/depression, gastroesophageal reflux disease who presented to the emergency department with chief complaint of worsening shortness of breath.  She was started on BiPAP and medicated in ICU. Now stable, she was moved out of ICU and transitioned to nasal cannula.   Clinical Impression   Pt seen for OT evaluation this date. Pt was requiring assist for ADL and IADL prior to admission from spouse but was generally able to perform UB dressing and toileting with modified independence (BSC over toilet). Pt ambulating short household distances with SPC primarily. Pt lives in a 1 story apt with her spouse who is available 24/7. Pt on 2 liters of O2 at home, currently on 3L O2 at time of evaluation. Pt reports becoming easily fatigued or out of breath with minimize exertion. Pt currently requires mod-max assist for LB ADL tasks due to poor activity tolerance, SOB, generalized weakness, and impaired balance. Pt educated in energy conservation conservation strategies including pursed lip breathing (required additional cues to utilize), activity pacing, home/routines modifications, work simplification, AE/DME, prioritizing of meaningful occupations, and falls prevention. Handout provided. Pt verbalized understanding but would benefit from additional skilled OT services to maximize recall and carryover of learned techniques and facilitate implementation of learned techniques into daily routines. Upon discharge, recommend STR to facilitate optimally safe eventual return home.       Follow Up Recommendations  SNF    Equipment Recommendations  Other (comment)(TBD)    Recommendations for Other Services        Precautions / Restrictions Precautions Precautions: Fall Precaution Comments: reports hx of falling multiple times Restrictions Weight Bearing Restrictions: No      Mobility Bed Mobility     General bed mobility comments: deferred, pt up in recliner  Transfers Overall transfer level: Needs assistance Equipment used: Rolling walker (2 wheeled) Transfers: Sit to/from Stand Sit to Stand: Min assist            Balance Overall balance assessment: Needs assistance Sitting-balance support: Single extremity supported;Feet supported Sitting balance-Leahy Scale: Fair Sitting balance - Comments: Pt has dififculty staying upright due to body habitus and increased SOB Postural control: Posterior lean Standing balance support: No upper extremity supported Standing balance-Leahy Scale: Fair Standing balance comment: Pt needs BUE on RW for safe balance. Is unable to accept mod perterbations w/o reachign hands to RW for support                         ADL either performed or assessed with clinical judgement   ADL Overall ADL's : Needs assistance/impaired Eating/Feeding: Sitting;Independent Eating/Feeding Details (indicate cue type and reason): pt educated in taking brief rest breaks while eating and drinking to minimize SOB and incorporate PLB Grooming: Sitting;Independent   Upper Body Bathing: Sitting;Minimal assistance;Moderate assistance   Lower Body Bathing: Moderate assistance;Maximal assistance   Upper Body Dressing : Sitting;Minimal assistance;Moderate assistance   Lower Body Dressing: Sit to/from stand;Maximal assistance;Moderate assistance   Toilet Transfer: RW;Minimal assistance;BSC           Functional mobility during ADLs: Minimal assistance;Rolling walker       Vision Baseline Vision/History: Wears glasses Wears Glasses: At all times Patient Visual Report: No change from baseline  Perception     Praxis      Pertinent  Vitals/Pain Pain Assessment: No/denies pain     Hand Dominance Right   Extremity/Trunk Assessment Upper Extremity Assessment Upper Extremity Assessment: Generalized weakness(brace on L wrist--pt reports she has arthritis)   Lower Extremity Assessment Lower Extremity Assessment: Generalized weakness   Cervical / Trunk Assessment Cervical / Trunk Assessment: Kyphotic   Communication Communication Communication: HOH   Cognition Arousal/Alertness: Awake/alert Behavior During Therapy: WFL for tasks assessed/performed Overall Cognitive Status: Within Functional Limits for tasks assessed                                 General Comments: A&O x4.  Follows commands consistently.   General Comments       Exercises Other Exercises Other Exercises: Pt educated in energy conservation strategies including activity pacing, work simplification, AE/DME, falls prevention, and home/routines modifications to maximize safety/independence and minimize SOB or COPD exaccerbation Other Exercises: Pt educated in pursed lip breathing and demonstrated some difficulty with sequencing, requiring mod verbal and visual cues   Shoulder Instructions      Home Living Family/patient expects to be discharged to:: Private residence Living Arrangements: Spouse/significant other Available Help at Discharge: Family;Available 24 hours/day Type of Home: Apartment Home Access: Level entry Entrance Stairs-Number of Steps: (per PT eval, pt notes 3 steps to enter, but during OT evaluation pt states level entry into apartment) Entrance Stairs-Rails: None Home Layout: One level     Bathroom Shower/Tub: Teacher, early years/pre: Standard(BSC Over top)     Home Equipment: Environmental consultant - 2 wheels;Cane - single point;Bedside commode;Tub bench          Prior Functioning/Environment Level of Independence: Needs assistance  Gait / Transfers Assistance Needed: Walking mostly household distances with  SPC.  RW occassionally.  ADL's / Homemaking Assistance Needed: Husband assists with bathing, cleaning, LB dressing, cooking, med mgt, and transportation; pt able to perform toileting modified independently with BSC over toilet   Comments: Home O2 @ 2L        OT Problem List: Decreased strength;Decreased knowledge of use of DME or AE;Decreased activity tolerance;Cardiopulmonary status limiting activity;Impaired UE functional use;Impaired balance (sitting and/or standing)      OT Treatment/Interventions: Self-care/ADL training;Balance training;Therapeutic exercise;Therapeutic activities;Energy conservation;DME and/or AE instruction;Patient/family education    OT Goals(Current goals can be found in the care plan section) Acute Rehab OT Goals Patient Stated Goal: To go home  OT Goal Formulation: With patient Time For Goal Achievement: 10/16/17 Potential to Achieve Goals: Good ADL Goals Pt Will Perform Lower Body Dressing: with min assist;sit to/from stand(AE as needed) Pt Will Transfer to Toilet: with min guard assist;ambulating;bedside commode(BSC over toilet, LRAD for amb) Additional ADL Goal #1: Pt will utilize pursed lip breathing during ADL and functional mobility tasks with <25% verbal cues to use in order to maximize safety/independence and minimize SOB. Additional ADL Goal #2: Pt will verbalize plan to implement at least 1 learned energy conservation strategy in order to maximize safety and independence.  OT Frequency: Min 1X/week   Barriers to D/C:            Co-evaluation              AM-PAC PT "6 Clicks" Daily Activity     Outcome Measure Help from another person eating meals?: None Help from another person taking care of personal grooming?: None Help from another person toileting,  which includes using toliet, bedpan, or urinal?: A Little Help from another person bathing (including washing, rinsing, drying)?: A Lot Help from another person to put on and taking off  regular upper body clothing?: A Little Help from another person to put on and taking off regular lower body clothing?: A Lot 6 Click Score: 18   End of Session Equipment Utilized During Treatment: Oxygen(3L)  Activity Tolerance: Patient tolerated treatment well Patient left: in chair;with call bell/phone within reach;with chair alarm set  OT Visit Diagnosis: Other abnormalities of gait and mobility (R26.89);Muscle weakness (generalized) (M62.81);History of falling (Z91.81)                Time: 7680-8811 OT Time Calculation (min): 19 min Charges:  OT General Charges $OT Visit: 1 Visit OT Evaluation $OT Eval Low Complexity: 1 Low OT Treatments $Self Care/Home Management : 8-22 mins  Jeni Salles, MPH, MS, OTR/L ascom [phone] 10/02/17, 10:59 AM

## 2017-10-03 ENCOUNTER — Ambulatory Visit: Payer: Medicare HMO | Admitting: Pulmonary Disease

## 2017-10-03 DIAGNOSIS — Z7189 Other specified counseling: Secondary | ICD-10-CM

## 2017-10-03 DIAGNOSIS — F411 Generalized anxiety disorder: Secondary | ICD-10-CM

## 2017-10-03 DIAGNOSIS — J441 Chronic obstructive pulmonary disease with (acute) exacerbation: Principal | ICD-10-CM

## 2017-10-03 DIAGNOSIS — J9621 Acute and chronic respiratory failure with hypoxia: Secondary | ICD-10-CM

## 2017-10-03 DIAGNOSIS — Z515 Encounter for palliative care: Secondary | ICD-10-CM

## 2017-10-03 LAB — GLUCOSE, CAPILLARY
GLUCOSE-CAPILLARY: 182 mg/dL — AB (ref 70–99)
GLUCOSE-CAPILLARY: 293 mg/dL — AB (ref 70–99)
Glucose-Capillary: 211 mg/dL — ABNORMAL HIGH (ref 70–99)
Glucose-Capillary: 230 mg/dL — ABNORMAL HIGH (ref 70–99)

## 2017-10-03 LAB — CBC
HEMATOCRIT: 32 % — AB (ref 35.0–47.0)
Hemoglobin: 10.4 g/dL — ABNORMAL LOW (ref 12.0–16.0)
MCH: 28.2 pg (ref 26.0–34.0)
MCHC: 32.4 g/dL (ref 32.0–36.0)
MCV: 86.9 fL (ref 80.0–100.0)
Platelets: 187 10*3/uL (ref 150–440)
RBC: 3.68 MIL/uL — ABNORMAL LOW (ref 3.80–5.20)
RDW: 18.8 % — AB (ref 11.5–14.5)
WBC: 8.8 10*3/uL (ref 3.6–11.0)

## 2017-10-03 MED ORDER — INSULIN GLARGINE 100 UNIT/ML ~~LOC~~ SOLN: 15.0000 [IU] | Freq: Every day | SUBCUTANEOUS | Status: DC

## 2017-10-03 MED ORDER — GUAIFENESIN 100 MG/5ML PO SOLN
5.0000 mL | ORAL | Status: DC | PRN
  Administered 2017-10-03: 100 mg via ORAL
  Filled 2017-10-03 (×2): qty 5

## 2017-10-03 MED ORDER — INSULIN ASPART 100 UNIT/ML ~~LOC~~ SOLN
6.0000 [IU] | Freq: Three times a day (TID) | SUBCUTANEOUS | Status: DC
  Administered 2017-10-03 (×2): 6 [IU] via SUBCUTANEOUS
  Filled 2017-10-03 (×2): qty 1

## 2017-10-03 MED ORDER — INSULIN GLARGINE 100 UNIT/ML ~~LOC~~ SOLN
15.0000 [IU] | Freq: Every day | SUBCUTANEOUS | Status: DC
  Administered 2017-10-03: 17:00:00 15 [IU] via SUBCUTANEOUS
  Filled 2017-10-03 (×2): qty 0.15

## 2017-10-03 MED ORDER — LIVING WELL WITH DIABETES BOOK
Freq: Once | Status: AC
  Administered 2017-10-03: 13:00:00
  Filled 2017-10-03: qty 1

## 2017-10-03 NOTE — Progress Notes (Addendum)
Inpatient Diabetes Program Recommendations  AACE/ADA: New Consensus Statement on Inpatient Glycemic Control (2015)  Target Ranges:  Prepandial:   less than 140 mg/dL      Peak postprandial:   less than 180 mg/dL (1-2 hours)      Critically ill patients:  140 - 180 mg/dL  Results for Katherine [name], Katherine [name] (MRN [id number]) as of 10/03/2017 11:17  Ref. Range 10/02/2017 07:47 10/02/2017 11:50 10/02/2017 16:39 10/02/2017 21:04 10/03/2017 07:42  Glucose-Capillary Latest Ref Range: 70 - 99 mg/dL 357 (H) 364 (H) 238 (H) 336 (H) 182 (H)   Results for Katherine [name], Katherine [name] (MRN [id number]) as of 10/03/2017 11:17  Ref. Range 06/29/2017 05:43 09/12/2017 03:39 10/01/2017 15:07  Hemoglobin A1C Latest Ref Range: 4.8 - 5.6 % 6.5 (H) 6.8 (H) 7.6 (H)   Review of Glycemic Control  Diabetes history:No Outpatient Diabetes medications:NA Current orders for Inpatient glycemic control: Lantus 15 units daily,Novolog 0-9 units TID with meals, Novolog 0-5 units QHS, Novolog 6 units TID with meals; Solumedrol 40 mg Q12H  Inpatient Diabetes Program Recommendations: Insulin-Basal: Noted Lantus increased from 10 to 15 units daily today.   A1C: A1Cof 7.6% on 10/01/17 indicating an average glucose of 171 mg/dl.  Per MD note, patient is being newly dx with DM at this time. Ordered Living Well with DM book, RD consult for diet education, and patient education by bedside nursing.   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 During conversation, patient frequently focused attention on her breathing issues and she expressed that her breathing is her main concern as she gets anxious when she feels she can't breathe.  Frequently had to redirect conversation from COPD/breathing to DM.  Patient reports that she did not have any hx of pre-DM or DM in the past. Explained that over the past 6 months, it has been noted that she has been admitted in the hospital 10 times and she has been receiving  IV and PO steroids due to breathing issues which is causing hyperglycemia. Explained how steroids can cause hyperglycemia and that patient is being newly dx with DM at this time. Discussed A1C results (7.6% on 10/01/17) and explained what an A1C is and informed patient that her current A1C indicates an average glucose of 171 mg/dl over the past 2-3 months. Discussed prior A1Cs over the past 4 months and explained that they have increased and now up to 7.6% so MD has decided to dx with DM at this time. Discussed basic pathophysiology of DM Type 2, basic home care, importance of checking CBGs and maintaining good CBG control to prevent long-term and short-term complications. Reviewed glucose and A1C goals.  Discussed impact of nutrition, exercise, stress, sickness, and medications on diabetes control. Patient has Living Well with diabetes booklet at bedside and she reports that she has started reading it. Encouraged patient to read through entire book. Explained to patient that if she were able to get to a point in which she would not require any steroids her glucose may come back down to normal and A1C may be much lower.  Patient verbalized understanding of information discussed and she states that she has no further questions at this time related to diabetes.   RNs to provide ongoing basic DM education at bedside with this patient and engage patient to actively check blood glucose.   Thanks, Barnie Alderman, RN, MSN, CDE Diabetes Coordinator Inpatient Diabetes Program [phone] (Team Pager from 8am to 5pm)

## 2017-10-03 NOTE — Progress Notes (Signed)
Family Meeting Note  Advance Directive:yes  Today a meeting took place with the patient. Patient has recurrent admissions for COPD flare, admitted 12 times in the last 6 months and recommend DNR status but she wants to be full code, explained to her that with her advanced age, underlying COPD with recurrent admissions her quality of life is poor she wants to be full code at this time, appreciate palliative care seeing her ,  The following clinical team members were present during this meeting:MD  The following were discussed: Additional follow-up to be provided:Palliative care  prognosis is poor  Time spent during discussion:17 min Epifanio Lesches, MD

## 2017-10-03 NOTE — Care Management Important Message (Signed)
Important Message  Patient Details  Name: Katherine [name] MRN: [id number] Date of Birth: Nov 10, 1940   Medicare Important Message Given:  Yes    [name] 10/03/2017, 9:42 AM

## 2017-10-03 NOTE — Consult Note (Signed)
Consultation Note Date: 10/03/17  Patient Name: Fujie Dickison  DOB: 04/24/1940  MRN: [id number]  Age / Sex: 77 y.o., female  PCP: Valerie Roys, DO Referring Physician: Epifanio Lesches, MD  Reason for Consultation: Establishing goals of care  HPI/Patient Profile: 77 y.o. female  with past medical history of COPD on home oxygen at 2L, osteoarthritis, left ventricular hypertrophy, HTN, GERD, CKD, anxiety, depression admitted on 09/30/2017 with shortness of breath. Multiple admissions for COPD exacerbations. Receiving bronchodilators, IV steroids, and empiric doxycycline. Palliative medicine consultation for goals of care.   Clinical Assessment and Goals of Care:  I have reviewed medical records, discussed with Dr. Vianne Bulls, and met with patient at bedside to discuss diagnosis, [place], EOL wishes, disposition and options. (Patient seen in April 2019 by PMT. At this time MOST form was completed with DNR code status. Since that admission, she has had multiple admissions for COPD exacerbation with desire for FULL code).    Introduced Palliative Medicine as specialized medical care for people living with serious illness. It focuses on providing relief from the symptoms and stress of a serious illness. The goal is to improve quality of life for both the patient and the family.  We discussed a brief life review of the patient. Lives with husband who she speaks very highly of. She has supportive children that are local. She shares that diagnosis of COPD was many years ago and she quit smoking about 6 months ago. She has required home oxygen at 2L. Baseline, able to ambulate with minimal assist and perform ADL's. She feels she has a good quality of life and is blessed to still participate frequently at church.   Discussed events leading up to hospitalization and course of hospital diagnoses and interventions. Attempted  to evaluate patient's understanding of COPD being a chronic, progressive disease. Educated on disease trajectory. Explained that recurrent hospitalizations indicates disease progression. Patient does acknowledge her understanding that COPD is irreversible. She does speak of not understanding why they can't "figure out what's wrong with me." I again explained disease trajectory of COPD. She speaks of having anxiety relieved by prn xanax.   I attempted to elicit values and goals of care important to the patient. Advanced directives, concepts specific to code status, artifical feeding and hydration, and rehospitalization were considered and discussed. Patient speaks of wishing for "everything to be done" in regards to resuscitation and life support measures. She is hopeful and praying that she will live long enough to see her two young granddaughters grow up and graduate high school. We do speak further about prolonged life support measures that may mean poor quality of life. Ms. Wain does share that she would NOT want prolonged interventions if like a "vegetable." She tells me her husband and children know her wishes. I encouraged her to continue to discuss her wishes with family as disease progresses.   Did briefly mention the role of palliative care and symptom management when chronic diseases progress.   Therapeutic listening to patient as she shares her strong  Christian faith and love for the [place]. Very spiritual individual. Today is her birthday. She is hopeful to be discharged back home tomorrow and again speaks highly of the care she receives from her husband.   Questions and concerns were addressed. PMT contact information given.     SUMMARY OF RECOMMENDATIONS    FULL code/FULL scope treatment. Patient wishes for all life-prolonging measures with hopes that she will live to see her young granddaughters grow up and graduate high school.  Educated on COPD disease trajectory.   Patient does  acknowledge that she would not wish for prolonged, heroic interventions if she had poor quality of life or like a "vegetable." She shares that her family knows her wishes. Encouraged patient to continue discussing her wishes with family as COPD progresses.   Patient declining SNF for rehab. Likely discharge back home soon.   May benefit from continued [place] conversation from outpatient palliative services.   Code Status/Advance Care Planning:  Full code  Symptom Management:   Per attending  Palliative Prophylaxis:   Delirium Protocol, Oral Care and Turn Reposition  Additional Recommendations (Limitations, Scope, Preferences):  Full Scope Treatment  Psycho-social/Spiritual:   Desire for further Chaplaincy support:yes  Additional Recommendations: Caregiving  Support/Resources and Education on Hospice  Prognosis:   Unable to determine  Discharge Planning: Home with Palliative Services      Primary Diagnoses: Present on Admission: . Acute on chronic respiratory failure with hypoxia ([place])   I have reviewed the medical record, interviewed the patient and family, and examined the patient. The following aspects are pertinent.  Past Medical History:  Diagnosis Date  . Angioedema   . Anxiety   . Anxiety and depression   . Chronic constipation   . Chronic kidney disease    stage 3  . COPD (chronic obstructive pulmonary disease) ([place])   . Gallstones   . GERD (gastroesophageal reflux disease)   . Hypertension   . Left ventricular hypertrophy   . Osteoarthritis   . Prurigo nodularis   . Tobacco abuse   . Vitamin D deficiency disease    Social History   Socioeconomic History  . Marital status: Married    Spouse name: [name]   . Number of children: 3  . Years of education: Not on file  . Highest education level: 11th grade  Occupational History  . Occupation: retired  Scientific laboratory technician  . Financial resource strain: Not hard at all  . Food insecurity:    Worry: Never  true    Inability: Never true  . Transportation needs:    Medical: No    Non-medical: No  Tobacco Use  . Smoking status: Former Smoker    Packs/day: 0.50    Years: 60.00    Pack years: 30.00    Types: Cigarettes  . Smokeless tobacco: Never Used  Substance and Sexual Activity  . Alcohol use: No    Alcohol/week: 0.0 standard drinks    Comment: rare  . Drug use: No  . Sexual activity: Yes  Lifestyle  . Physical activity:    Days per week: 7 days    Minutes per session: 20 min  . Stress: Not at all  Relationships  . Social connections:    Talks on phone: More than three times a week    Gets together: Once a week    Attends religious service: More than 4 times per year    Active member of club or organization: No    Attends meetings of clubs  or organizations: Never    Relationship status: Married  Other Topics Concern  . Not on file  Social History Narrative  . Not on file   Family History  Problem Relation Age of Onset  . Alcohol abuse Mother   . Sickle cell anemia Daughter   . Hypertension Son   . Cancer Neg Hx   . COPD Neg Hx   . Diabetes Neg Hx   . Heart disease Neg Hx   . Stroke Neg Hx    Scheduled Meds: . atorvastatin  80 mg Oral Daily  . budesonide (PULMICORT) nebulizer solution  0.25 mg Nebulization BID  . chlorhexidine  15 mL Mouth Rinse BID  . cholecalciferol  1,000 Units Oral Daily  . clopidogrel  75 mg Oral Daily  . diltiazem  120 mg Oral Daily  . docusate sodium  100 mg Oral Daily  . doxycycline  100 mg Oral Q12H  . FLUoxetine  10 mg Oral QHS  . gabapentin  300 mg Oral QHS  . guaiFENesin  600 mg Oral BID  . heparin injection (subcutaneous)  5,000 Units Subcutaneous Q8H  . hydrALAZINE  25 mg Oral TID  . insulin aspart  0-5 Units Subcutaneous QHS  . insulin aspart  0-9 Units Subcutaneous TID WC  . insulin aspart  6 Units Subcutaneous TID WC  . insulin glargine  15 Units Subcutaneous Daily  . ipratropium-albuterol  3 mL Nebulization Q6H  . mouth  rinse  15 mL Mouth Rinse q12n4p  . methylPREDNISolone (SOLU-MEDROL) injection  40 mg Intravenous Q12H  . pantoprazole  40 mg Oral Daily   Continuous Infusions: . sodium chloride     PRN Meds:.acetaminophen, albuterol, ALPRAZolam, guaiFENesin, senna Medications Prior to Admission:  Prior to Admission medications   Medication Sig Start Date End Date Taking? Authorizing Provider  acetaminophen (TYLENOL) 650 MG CR tablet Take 650-1,300 mg by mouth every 8 (eight) hours as needed for pain.    Yes [provider]  albuterol (ACCUNEB) 0.63 MG/3ML nebulizer solution Take 3 mLs (0.63 mg total) by nebulization 3 (three) times daily as needed for wheezing. DX:J44.9 07/23/17  Yes [name], MD  albuterol (PROVENTIL HFA) 108 (90 Base) MCG/ACT inhaler Inhale 2 puffs into the lungs every 4 (four) hours as needed for wheezing or shortness of breath. 05/30/17  Yes Wieting, Richard, MD  ALPRAZolam Duanne Moron) 0.5 MG tablet Take 1 tablet (0.5 mg total) by mouth 2 (two) times daily as needed for up to 15 doses for anxiety. 09/22/17  Yes Gouru, Illene Silver, MD  atorvastatin (LIPITOR) 80 MG tablet Take 80 mg by mouth daily. 08/02/17  Yes [provider]  cetirizine (ZYRTEC) 10 MG tablet TAKE 1/2 TABLET(5 MG) BY MOUTH AT BEDTIME 09/24/17  Yes Pollak, Adriana M, PA-C  cholecalciferol (VITAMIN D) 1000 units tablet Take 1,000 Units by mouth daily.   Yes [provider]  clopidogrel (PLAVIX) 75 MG tablet Take 1 tablet (75 mg total) by mouth daily. 09/05/17  Yes [name], NP  diltiazem (CARDIZEM CD) 120 MG 24 hr capsule Take 120 mg by mouth daily.   Yes [provider]  docusate sodium (COLACE) 100 MG capsule Take 1 capsule (100 mg total) by mouth daily. 09/22/17  Yes Gouru, Illene Silver, MD  FLUoxetine (PROZAC) 10 MG capsule TAKE 1 CAPSULE(10 MG) BY MOUTH DAILY 07/25/17  Yes Johnson, Akisha Sturgill P, DO  Fluticasone-Umeclidin-Vilant (TRELEGY ELLIPTA) 100-62.5-25 MCG/INH AEPB Inhale 1 puff into the lungs  daily. 08/09/17  Yes [name], MD  gabapentin (NEURONTIN) 300 MG capsule Take 1 capsule (300 mg total) by mouth at bedtime. 09/05/17  Yes [name], NP  hydrALAZINE (APRESOLINE) 25 MG tablet Take 1 tablet (25 mg total) by mouth 3 (three) times daily. 09/05/17  Yes [name], NP  ipratropium (ATROVENT) 0.03 % nasal spray Place 2 sprays into both nostrils every 12 (twelve) hours.   Yes [provider]  ipratropium-albuterol (DUONEB) 0.5-2.5 (3) MG/3ML SOLN USE 1 VIAL VIA NEBULIZER EVERY 6 HOURS AS NEEDED 07/23/17  Yes [name], MD  meclizine (ANTIVERT) 25 MG tablet Take 1 tablet (25 mg total) by mouth 2 (two) times daily as needed for dizziness. 09/22/17 10/22/17 Yes Gouru, Illene Silver, MD  methocarbamol (ROBAXIN) 500 MG tablet Take 500 mg by mouth every 8 (eight) hours as needed for muscle spasms.  08/08/17  Yes [provider]  pantoprazole (PROTONIX) 40 MG tablet Take 1 tablet (40 mg total) by mouth daily. 09/05/17  Yes [name], NP  promethazine (PHENERGAN) 12.5 MG tablet Take 12.5 mg by mouth every 6 (six) hours as needed for nausea or vomiting.    Yes [provider]  senna (SENOKOT) 8.6 MG tablet Take 2 tablets by mouth at bedtime as needed for constipation.    Yes [provider]  tiotropium (SPIRIVA) 18 MCG inhalation capsule Place 18 mcg into inhaler and inhale daily.   Yes [provider]  predniSONE (STERAPRED UNI-PAK 21 TAB) 10 MG (21) TBPK tablet Take 1 tablet (10 mg total) by mouth daily. Take 6 tablets by mouth for 1 day followed by  5 tablets by mouth for 1 day followed by  4 tablets by mouth for 1 day followed by  3 tablets by mouth for 1 day followed by  2 tablets by mouth for 1 day followed by  1 tablet by mouth for a day and stop Patient not taking: Reported on 09/30/2017 09/22/17   Nicholes Mango, MD   Allergies  Allergen Reactions  . Bee Venom Swelling  . Enalapril Maleate Swelling   Review of Systems    Constitutional: Positive for fatigue.  Respiratory: Positive for shortness of breath.    Physical Exam  Constitutional: She is oriented to person, place, and time. She is cooperative.  HENT:  Head: Normocephalic and atraumatic.  Cardiovascular: Regular rhythm.  Pulmonary/Chest: No accessory muscle usage. No tachypnea. No respiratory distress.  2L O2  Abdominal: There is no tenderness.  Neurological: She is alert and oriented to person, place, and time.  Skin: Skin is warm and dry.  Psychiatric: She has a normal mood and affect. Her speech is normal and behavior is normal. Cognition and memory are normal.  Nursing note and vitals reviewed.   Vital Signs: BP 130/87   Pulse 66   Temp 98.1 F (36.7 C)   Resp (!) 26   Ht 5' 2"  (1.575 m)   Wt 123.4 kg   SpO2 97%   BMI 49.77 kg/m  Pain Scale: 0-10 POSS *See Group Information*: 1-Acceptable,Awake and alert Pain Score: 0-No pain   SpO2: SpO2: 97 % O2 Device:SpO2: 97 % O2 Flow Rate: .O2 Flow Rate (L/min): 3 L/min  IO: Intake/output summary:   Intake/Output Summary (Last 24 hours) at 10/04/2017 0810 Last data filed at 10/03/2017 1928 Gross per 24 hour  Intake 720 ml  Output 850 ml  Net -130 ml    LBM: Last BM Date: 10/03/17 Baseline Weight: Weight: 126.2 kg Most recent weight: Weight: 123.4 kg  Palliative Assessment/Data: PPS 50%   Flowsheet Rows     Most Recent Value  Intake Tab  Referral Department  Hospitalist  Unit at Time of Referral  Med/Surg Unit  Palliative Care Primary Diagnosis  Pulmonary  Palliative Care Type  Return patient Palliative Care  Reason for referral  Clarify Goals of Care  Date first seen by Palliative Care  10/03/17  Clinical Assessment  Palliative Performance Scale Score  50%  Psychosocial & Spiritual Assessment  Palliative Care Outcomes  Patient/Family meeting held?  Yes  Who was at the meeting?  patient  Palliative Care Outcomes  Clarified goals of care, Provided psychosocial or  spiritual support, ACP counseling assistance, Linked to palliative care logitudinal support      Time In: 1100 Time Out: 1200 Time Total: 30mn Greater than 50%  of this time was spent counseling and coordinating care related to the above assessment and plan.  Signed by:  MIhor Dow FNP-C Palliative Medicine Team  Phone: [phone]: [phone]  Please contact Palliative Medicine Team phone at [phone] questions and concerns.  For individual provider: See AShea Evans

## 2017-10-03 NOTE — Progress Notes (Addendum)
[name] NAME: Katherine [name]    MR#:  [id number]  DATE OF BIRTH:  06-19-1940  Patient shortness of breath slightly better than yesterday but still has lots of phlegm, unable to get the phlegm out..  CHIEF COMPLAINT:   Chief Complaint  Patient presents with  . Respiratory Distress    REVIEW OF SYSTEMS:   ROS CONSTITUTIONAL: No fever, fatigue or weakness.  EYES: No blurred or double vision.  EARS, NOSE, AND THROAT: No tinnitus or ear pain.  RESPIRATORY:  mild shortness of breath.  CARDIOVASCULAR: No chest pain, orthopnea, edema.  GASTROINTESTINAL: No nausea, vomiting, diarrhea or abdominal pain.  GENITOURINARY: No dysuria, hematuria.  ENDOCRINE: No polyuria, nocturia,  HEMATOLOGY: No anemia, easy bruising or bleeding SKIN: No rash or lesion. MUSCULOSKELETAL: No joint pain or arthritis.   NEUROLOGIC: No tingling, numbness, weakness.  PSYCHIATRY: No anxiety or depression.   DRUG ALLERGIES:   Allergies  Allergen Reactions  . Bee Venom Swelling  . Enalapril Maleate Swelling    VITALS:  Blood pressure (!) 160/96, pulse 72, temperature 98.2 F (36.8 C), temperature source Oral, resp. rate 20, height 5\' 2"  (1.575 m), weight 123.4 kg, SpO2 97 %.  PHYSICAL EXAMINATION:  GENERAL:  77 y.o.-year-old patient lying in the bed with no acute distress.  EYES: Pupils equal, round, reactive to light and accommodation. No scleral icterus. Extraocular muscles intact.  HEENT: Head atraumatic, normocephalic. Oropharynx and nasopharynx clear.  NECK:  Supple, no jugular venous distention. No thyroid enlargement, no tenderness.  LUNGS: Faint expiratory wheeze.  Respiration.  CARDIOVASCULAR: S1, S2 normal. No murmurs, rubs, or gallops.  ABDOMEN: Soft, nontender, nondistended. Bowel sounds present. No organomegaly or mass.  EXTREMITIES: No pedal edema, cyanosis, or clubbing.  NEUROLOGIC: Cranial nerves II through XII are intact.  Muscle strength 5/5 in all extremities. Sensation intact. Gait not checked.  PSYCHIATRIC: The patient is alert and oriented x 3.  SKIN: No obvious rash, lesion, or ulcer.    LABORATORY PANEL:   CBC Recent Labs  Lab 10/03/17 0349  WBC 8.8  HGB 10.4*  HCT 32.0*  PLT 187   ------------------------------------------------------------------------------------------------------------------  Chemistries  Recent Labs  Lab 09/30/17 1235 10/01/17 0500  10/02/17 0509  NA 143 141   < > 140  K 4.4 5.6*   < > 5.1  CL 102 99   < > 101  CO2 32 31   < > 31  GLUCOSE 145* 315*   < > 317*  BUN 32* 41*   < > 46*  CREATININE 1.72* 2.07*   < > 1.87*  CALCIUM 8.8* 8.4*   < > 8.3*  MG  --  1.8  --   --   AST 17  --   --   --   ALT 22  --   --   --   ALKPHOS 67  --   --   --   BILITOT 0.8  --   --   --    < > = values in this interval not displayed.   ------------------------------------------------------------------------------------------------------------------  Cardiac Enzymes Recent Labs  Lab 09/30/17 1751  TROPONINI 0.03*   ------------------------------------------------------------------------------------------------------------------  RADIOLOGY:  No results found.  EKG:   Orders placed or performed during the hospital encounter of 09/30/17  . EKG 12-Lead  . EKG 12-Lead    ASSESSMENT AND PLAN:   Acute  On Chronic respiratory failure with COPD exacerbation: Initially required BiPAP, now change to nasal  cannula, out of ICU.  Patient has recurrent admissions for COPD flare has been admitted  at least 12 times in the last 6 months.  Continue bronchodilators, IV steroids.,    added Mucinex, patient still has lots of cough, unable to get the phlegm out.  Continue Mucinex, IV steroids, add flutter valve, continue empiric antibiotic with doxycycline.,  Pulmicort nebulizer.  2.  Essential hypertension: Slightly uncontrolled,, continue Cardizem, add Norvasc.   3 anxiety: Continue  Xanax, patient has recurrent admissions likely partly secondary to underlying anxiety, appreciate palliative care seeing the patient. #4 CKD stage III: Kidney function slightly better than yesterday.   5.  Mild hyperkalemia, potassium decreased from 5.6-5.1.,  Received Kayexalate. #6 .newly diagnosed diabetes mellitus type 2: Uncontrolled blood sugar secondary to steroids, increase the dose of Lantus, mealtime insulins 7.  Morbid obesity: Counseled about weight loss.  #7.disposition: Physical therapy recommended skilled nursing facility but patient adamant she wants to go home.  Likely discharge home with home health physical therapy tomorrow.    All the records are reviewed and case discussed with Care Management/Social Workerr. Management plans discussed with the patient, family and they are in agreement.  CODE STATUS: Full code  TOTAL TIME TAKING CARE OF THIS PATIENT: 83minutes.  50% time spent in counseling, coordination of care.   POSSIBLE D/C IN 1-2DAYS, DEPENDING ON CLINICAL CONDITION.   Epifanio Lesches M.D on 10/03/2017 at 10:50 AM  Between 7am to 6pm - Pager - [phone]  After 6pm go to [email or web address] - password [email or web address] [facility]  Office  [phone]  CC: Primary care physician; Valerie Roys, DO   Note: This dictation was prepared with Dragon dictation along with smaller phrase technology. Any transcriptional errors that result from this process are unintentional.

## 2017-10-03 NOTE — Progress Notes (Signed)
PMT consult received and chart reviewed. Met with patient at bedside. Seen by PMT in the past. Patient confirms her wish for continued FULL code/FULL scope treatment. She does acknowledge that she would NOT wish for prolonged, heroic interventions if she was in a vegetative state and with poor quality of life. Patient is very spiritual and prays that the [place] will allow her to live long enough to see two granddaughters graduate high school. She is hoping to discharge back home soon.   Full note to follow.   NO CHARGE  Ihor Dow, FNP-C Palliative Medicine Team  Phone: [phone] Fax: [phone]

## 2017-10-04 ENCOUNTER — Inpatient Hospital Stay: Payer: Medicare HMO

## 2017-10-04 ENCOUNTER — Inpatient Hospital Stay (HOSPITAL_COMMUNITY)
Admission: AD | Admit: 2017-10-04 | Discharge: 2017-10-10 | DRG: 091 | Disposition: A | Discharge status: Home / Self Care | Payer: Medicare HMO | Source: Other Acute Inpatient Hospital | Attending: Neurology | Admitting: Neurology

## 2017-10-04 ENCOUNTER — Other Ambulatory Visit: Payer: Self-pay

## 2017-10-04 ENCOUNTER — Encounter (HOSPITAL_COMMUNITY): Payer: Self-pay

## 2017-10-04 DIAGNOSIS — J439 Emphysema, unspecified: Secondary | ICD-10-CM | POA: Diagnosis not present

## 2017-10-04 DIAGNOSIS — J441 Chronic obstructive pulmonary disease with (acute) exacerbation: Secondary | ICD-10-CM | POA: Diagnosis present

## 2017-10-04 DIAGNOSIS — I129 Hypertensive chronic kidney disease with stage 1 through stage 4 chronic kidney disease, or unspecified chronic kidney disease: Secondary | ICD-10-CM | POA: Diagnosis not present

## 2017-10-04 DIAGNOSIS — J9811 Atelectasis: Secondary | ICD-10-CM | POA: Diagnosis not present

## 2017-10-04 DIAGNOSIS — G4733 Obstructive sleep apnea (adult) (pediatric): Secondary | ICD-10-CM | POA: Diagnosis not present

## 2017-10-04 DIAGNOSIS — N183 Chronic kidney disease, stage 3 (moderate): Secondary | ICD-10-CM | POA: Diagnosis present

## 2017-10-04 DIAGNOSIS — Z79899 Other long term (current) drug therapy: Secondary | ICD-10-CM | POA: Diagnosis not present

## 2017-10-04 DIAGNOSIS — R569 Unspecified convulsions: Secondary | ICD-10-CM

## 2017-10-04 DIAGNOSIS — J9621 Acute and chronic respiratory failure with hypoxia: Secondary | ICD-10-CM | POA: Diagnosis not present

## 2017-10-04 DIAGNOSIS — I2510 Atherosclerotic heart disease of native coronary artery without angina pectoris: Secondary | ICD-10-CM | POA: Diagnosis not present

## 2017-10-04 DIAGNOSIS — R040 Epistaxis: Secondary | ICD-10-CM | POA: Diagnosis not present

## 2017-10-04 DIAGNOSIS — E1169 Type 2 diabetes mellitus with other specified complication: Secondary | ICD-10-CM

## 2017-10-04 DIAGNOSIS — J449 Chronic obstructive pulmonary disease, unspecified: Secondary | ICD-10-CM | POA: Diagnosis not present

## 2017-10-04 DIAGNOSIS — R4701 Aphasia: Secondary | ICD-10-CM | POA: Diagnosis not present

## 2017-10-04 DIAGNOSIS — F3289 Other specified depressive episodes: Secondary | ICD-10-CM | POA: Diagnosis not present

## 2017-10-04 DIAGNOSIS — E559 Vitamin D deficiency, unspecified: Secondary | ICD-10-CM | POA: Diagnosis present

## 2017-10-04 DIAGNOSIS — E1122 Type 2 diabetes mellitus with diabetic chronic kidney disease: Secondary | ICD-10-CM | POA: Diagnosis not present

## 2017-10-04 DIAGNOSIS — J9600 Acute respiratory failure, unspecified whether with hypoxia or hypercapnia: Secondary | ICD-10-CM

## 2017-10-04 DIAGNOSIS — R042 Hemoptysis: Secondary | ICD-10-CM | POA: Diagnosis not present

## 2017-10-04 DIAGNOSIS — I639 Cerebral infarction, unspecified: Secondary | ICD-10-CM

## 2017-10-04 DIAGNOSIS — E1165 Type 2 diabetes mellitus with hyperglycemia: Secondary | ICD-10-CM | POA: Diagnosis present

## 2017-10-04 DIAGNOSIS — Z87891 Personal history of nicotine dependence: Secondary | ICD-10-CM

## 2017-10-04 DIAGNOSIS — R479 Unspecified speech disturbances: Secondary | ICD-10-CM | POA: Diagnosis not present

## 2017-10-04 DIAGNOSIS — K219 Gastro-esophageal reflux disease without esophagitis: Secondary | ICD-10-CM | POA: Diagnosis not present

## 2017-10-04 DIAGNOSIS — R29708 NIHSS score 8: Secondary | ICD-10-CM | POA: Diagnosis not present

## 2017-10-04 DIAGNOSIS — I671 Cerebral aneurysm, nonruptured: Secondary | ICD-10-CM

## 2017-10-04 DIAGNOSIS — E7849 Other hyperlipidemia: Secondary | ICD-10-CM | POA: Diagnosis not present

## 2017-10-04 DIAGNOSIS — M2550 Pain in unspecified joint: Secondary | ICD-10-CM | POA: Diagnosis not present

## 2017-10-04 DIAGNOSIS — I10 Essential (primary) hypertension: Secondary | ICD-10-CM | POA: Diagnosis not present

## 2017-10-04 DIAGNOSIS — H9190 Unspecified hearing loss, unspecified ear: Secondary | ICD-10-CM | POA: Diagnosis present

## 2017-10-04 DIAGNOSIS — E785 Hyperlipidemia, unspecified: Secondary | ICD-10-CM | POA: Diagnosis not present

## 2017-10-04 DIAGNOSIS — D631 Anemia in chronic kidney disease: Secondary | ICD-10-CM | POA: Diagnosis not present

## 2017-10-04 DIAGNOSIS — F17200 Nicotine dependence, unspecified, uncomplicated: Secondary | ICD-10-CM | POA: Diagnosis not present

## 2017-10-04 DIAGNOSIS — Z96651 Presence of right artificial knee joint: Secondary | ICD-10-CM | POA: Diagnosis present

## 2017-10-04 DIAGNOSIS — Z6841 Body Mass Index (BMI) 40.0 and over, adult: Secondary | ICD-10-CM | POA: Diagnosis not present

## 2017-10-04 DIAGNOSIS — M6281 Muscle weakness (generalized): Secondary | ICD-10-CM | POA: Diagnosis not present

## 2017-10-04 DIAGNOSIS — E875 Hyperkalemia: Secondary | ICD-10-CM | POA: Diagnosis not present

## 2017-10-04 DIAGNOSIS — I6389 Other cerebral infarction: Secondary | ICD-10-CM | POA: Diagnosis not present

## 2017-10-04 DIAGNOSIS — E1159 Type 2 diabetes mellitus with other circulatory complications: Secondary | ICD-10-CM | POA: Diagnosis present

## 2017-10-04 DIAGNOSIS — J189 Pneumonia, unspecified organism: Secondary | ICD-10-CM | POA: Diagnosis not present

## 2017-10-04 DIAGNOSIS — Z7902 Long term (current) use of antithrombotics/antiplatelets: Secondary | ICD-10-CM

## 2017-10-04 DIAGNOSIS — R739 Hyperglycemia, unspecified: Secondary | ICD-10-CM | POA: Diagnosis not present

## 2017-10-04 DIAGNOSIS — Z515 Encounter for palliative care: Secondary | ICD-10-CM

## 2017-10-04 DIAGNOSIS — R2990 Unspecified symptoms and signs involving the nervous system: Secondary | ICD-10-CM

## 2017-10-04 DIAGNOSIS — Z7401 Bed confinement status: Secondary | ICD-10-CM | POA: Diagnosis not present

## 2017-10-04 DIAGNOSIS — Z9282 Status post administration of tPA (rtPA) in a different facility within the last 24 hours prior to admission to current facility: Secondary | ICD-10-CM

## 2017-10-04 DIAGNOSIS — I152 Hypertension secondary to endocrine disorders: Secondary | ICD-10-CM | POA: Diagnosis present

## 2017-10-04 DIAGNOSIS — R262 Difficulty in walking, not elsewhere classified: Secondary | ICD-10-CM | POA: Diagnosis not present

## 2017-10-04 DIAGNOSIS — E1121 Type 2 diabetes mellitus with diabetic nephropathy: Secondary | ICD-10-CM | POA: Diagnosis not present

## 2017-10-04 DIAGNOSIS — Z8673 Personal history of transient ischemic attack (TIA), and cerebral infarction without residual deficits: Secondary | ICD-10-CM

## 2017-10-04 DIAGNOSIS — R29818 Other symptoms and signs involving the nervous system: Secondary | ICD-10-CM | POA: Diagnosis not present

## 2017-10-04 HISTORY — DX: Unspecified symptoms and signs involving the nervous system: R29.90

## 2017-10-04 LAB — GLUCOSE, CAPILLARY
Glucose-Capillary: 236 mg/dL — ABNORMAL HIGH (ref 70–99)
Glucose-Capillary: 231 mg/dL — ABNORMAL HIGH (ref 70–99)
Glucose-Capillary: 172 mg/dL — ABNORMAL HIGH (ref 70–99)
Glucose-Capillary: 146 mg/dL — ABNORMAL HIGH (ref 70–99)

## 2017-10-04 MED ORDER — FLUOXETINE HCL 10 MG PO CAPS
10.0000 mg | ORAL_CAPSULE | Freq: Every day | ORAL | Status: DC
  Administered 2017-10-05 – 2017-10-10 (×6): 10 mg via ORAL
  Filled 2017-10-04 (×7): qty 1

## 2017-10-04 MED ORDER — GABAPENTIN 300 MG PO CAPS
300.0000 mg | ORAL_CAPSULE | Freq: Every day | ORAL | Status: DC
  Administered 2017-10-05 – 2017-10-09 (×5): 300 mg via ORAL
  Filled 2017-10-04 (×5): qty 1

## 2017-10-04 MED ORDER — ALBUTEROL SULFATE 0.63 MG/3ML IN NEBU: 3.0000 mL | INHALATION_SOLUTION | Freq: Three times a day (TID) | RESPIRATORY_TRACT | Status: DC | PRN

## 2017-10-04 MED ORDER — IOPAMIDOL (ISOVUE-370) INJECTION 76%
75.0000 mL | Freq: Once | INTRAVENOUS | Status: AC | PRN
  Administered 2017-10-04: 60 mL via INTRAVENOUS

## 2017-10-04 MED ORDER — FAMOTIDINE IN NACL 20-0.9 MG/50ML-% IV SOLN
20.0000 mg | Freq: Two times a day (BID) | INTRAVENOUS | Status: DC
  Administered 2017-10-04 – 2017-10-05 (×3): 20 mg via INTRAVENOUS
  Filled 2017-10-04 (×3): qty 50

## 2017-10-04 MED ORDER — METHYLPREDNISOLONE SODIUM SUCC 40 MG IJ SOLR
40.0000 mg | Freq: Two times a day (BID) | INTRAMUSCULAR | Status: DC
  Administered 2017-10-04 – 2017-10-07 (×8): 40 mg via INTRAVENOUS
  Filled 2017-10-04 (×8): qty 1

## 2017-10-04 MED ORDER — HYDRALAZINE HCL 25 MG PO TABS
25.0000 mg | ORAL_TABLET | Freq: Three times a day (TID) | ORAL | Status: DC
  Administered 2017-10-05 – 2017-10-10 (×16): 25 mg via ORAL
  Filled 2017-10-04 (×16): qty 1

## 2017-10-04 MED ORDER — SODIUM CHLORIDE 0.9 % IV SOLN: INTRAVENOUS | Status: DC

## 2017-10-04 MED ORDER — STROKE: EARLY STAGES OF RECOVERY BOOK
Freq: Once | Status: DC
  Filled 2017-10-04: qty 1

## 2017-10-04 MED ORDER — ALPRAZOLAM 0.5 MG PO TABS
0.5000 mg | ORAL_TABLET | Freq: Two times a day (BID) | ORAL | Status: DC | PRN
  Administered 2017-10-06: 0.5 mg via ORAL
  Filled 2017-10-04: qty 1

## 2017-10-04 MED ORDER — SODIUM CHLORIDE 0.9 % IV SOLN
50.0000 mL | Freq: Once | INTRAVENOUS | Status: AC
  Administered 2017-10-04: 50 mL via INTRAVENOUS

## 2017-10-04 MED ORDER — ALTEPLASE (STROKE) FULL DOSE INFUSION
90.0000 mg | Freq: Once | INTRAVENOUS | Status: AC
  Administered 2017-10-04: 90 mg via INTRAVENOUS
  Filled 2017-10-04: qty 100

## 2017-10-04 MED ORDER — DILTIAZEM HCL ER 60 MG PO CP12
120.0000 mg | ORAL_CAPSULE | Freq: Two times a day (BID) | ORAL | Status: DC
  Administered 2017-10-05 – 2017-10-10 (×11): 120 mg via ORAL
  Filled 2017-10-04 (×12): qty 2

## 2017-10-04 MED ORDER — ORAL CARE MOUTH RINSE
15.0000 mL | Freq: Two times a day (BID) | OROMUCOSAL | Status: DC
  Administered 2017-10-04 – 2017-10-10 (×11): 15 mL via OROMUCOSAL

## 2017-10-04 MED ORDER — IPRATROPIUM-ALBUTEROL 0.5-2.5 (3) MG/3ML IN SOLN: 3.0000 mL | Freq: Four times a day (QID) | RESPIRATORY_TRACT | Status: DC | PRN

## 2017-10-04 MED ORDER — ATORVASTATIN CALCIUM 80 MG PO TABS
80.0000 mg | ORAL_TABLET | Freq: Every day | ORAL | Status: DC
  Administered 2017-10-05 – 2017-10-10 (×6): 80 mg via ORAL
  Filled 2017-10-04 (×6): qty 1

## 2017-10-04 MED ORDER — ALBUTEROL SULFATE (2.5 MG/3ML) 0.083% IN NEBU: 2.5000 mg | INHALATION_SOLUTION | RESPIRATORY_TRACT | Status: DC | PRN

## 2017-10-04 MED ORDER — LORATADINE 10 MG PO TABS
10.0000 mg | ORAL_TABLET | Freq: Every day | ORAL | Status: DC
  Administered 2017-10-05 – 2017-10-10 (×6): 10 mg via ORAL
  Filled 2017-10-04 (×6): qty 1

## 2017-10-04 MED ORDER — SODIUM CHLORIDE 0.9 % IV SOLN
INTRAVENOUS | Status: DC | PRN
  Administered 2017-10-04: 12:00:00 via INTRAVENOUS

## 2017-10-04 MED ORDER — INSULIN ASPART 100 UNIT/ML ~~LOC~~ SOLN
0.0000 [IU] | SUBCUTANEOUS | Status: DC
  Administered 2017-10-04: 4 [IU] via SUBCUTANEOUS
  Administered 2017-10-04: 3 [IU] via SUBCUTANEOUS
  Administered 2017-10-04: 7 [IU] via SUBCUTANEOUS
  Administered 2017-10-05: 15 [IU] via SUBCUTANEOUS
  Administered 2017-10-05: 4 [IU] via SUBCUTANEOUS
  Administered 2017-10-05: 15 [IU] via SUBCUTANEOUS
  Administered 2017-10-05 (×2): 4 [IU] via SUBCUTANEOUS
  Administered 2017-10-05: 3 [IU] via SUBCUTANEOUS
  Administered 2017-10-06 (×2): 11 [IU] via SUBCUTANEOUS
  Administered 2017-10-06: 4 [IU] via SUBCUTANEOUS
  Administered 2017-10-06: 7 [IU] via SUBCUTANEOUS
  Administered 2017-10-06 (×2): 4 [IU] via SUBCUTANEOUS
  Administered 2017-10-07: 11 [IU] via SUBCUTANEOUS
  Administered 2017-10-07 (×2): 7 [IU] via SUBCUTANEOUS

## 2017-10-04 MED ORDER — FLUTICASONE-UMECLIDIN-VILANT 100-62.5-25 MCG/INH IN AEPB: 1.0000 | INHALATION_SPRAY | Freq: Every day | RESPIRATORY_TRACT | Status: DC

## 2017-10-04 MED ORDER — ALBUTEROL SULFATE HFA 108 (90 BASE) MCG/ACT IN AERS: 2.0000 | INHALATION_SPRAY | RESPIRATORY_TRACT | Status: DC | PRN

## 2017-10-04 MED ORDER — TIOTROPIUM BROMIDE MONOHYDRATE 18 MCG IN CAPS
18.0000 ug | ORAL_CAPSULE | Freq: Every day | RESPIRATORY_TRACT | Status: DC
  Administered 2017-10-05 – 2017-10-09 (×4): 18 ug via RESPIRATORY_TRACT
  Filled 2017-10-04 (×3): qty 5

## 2017-10-04 MED ORDER — ARFORMOTEROL TARTRATE 15 MCG/2ML IN NEBU
15.0000 ug | INHALATION_SOLUTION | Freq: Two times a day (BID) | RESPIRATORY_TRACT | Status: DC
  Administered 2017-10-04 (×2): 15 ug via RESPIRATORY_TRACT
  Filled 2017-10-04 (×2): qty 2

## 2017-10-04 MED ORDER — LABETALOL HCL 5 MG/ML IV SOLN: 20.0000 mg | Freq: Once | INTRAVENOUS | Status: DC

## 2017-10-04 MED ORDER — NICARDIPINE HCL IN NACL 20-0.86 MG/200ML-% IV SOLN
0.0000 mg/h | INTRAVENOUS | Status: DC
  Administered 2017-10-04 – 2017-10-05 (×2): 5 mg/h via INTRAVENOUS
  Filled 2017-10-04 (×2): qty 200

## 2017-10-04 MED ORDER — IOPAMIDOL (ISOVUE-370) INJECTION 76%: 100.0000 mL | Freq: Once | INTRAVENOUS | Status: DC | PRN

## 2017-10-04 MED ORDER — BUDESONIDE 0.5 MG/2ML IN SUSP
0.5000 mg | Freq: Two times a day (BID) | RESPIRATORY_TRACT | Status: DC
  Administered 2017-10-04 – 2017-10-10 (×13): 0.5 mg via RESPIRATORY_TRACT
  Filled 2017-10-04 (×13): qty 2

## 2017-10-04 MED ORDER — SODIUM CHLORIDE 0.9 % IV SOLN
1.0000 g | INTRAVENOUS | Status: DC
  Administered 2017-10-04 – 2017-10-06 (×3): 1 g via INTRAVENOUS
  Filled 2017-10-04 (×3): qty 10

## 2017-10-04 MED ORDER — IPRATROPIUM BROMIDE 0.03 % NA SOLN: 2.0000 | Freq: Two times a day (BID) | NASAL | Status: DC

## 2017-10-04 MED ORDER — SENNOSIDES-DOCUSATE SODIUM 8.6-50 MG PO TABS: 1.0000 | ORAL_TABLET | Freq: Every evening | ORAL | Status: DC | PRN

## 2017-10-04 NOTE — Patient Outreach (Signed)
[facility]) Care Management  10/04/2017  Katherine [name] [birth year] [id number]  Case Conference: Difficult Case Discussion completed today with [facility]  multidisciplinary team. [place] RN CM will continue to follow patient. RN CM will collaborate with Hospital Liaison in attempt to facilitate possible inpatient Rheumatology consult and high resolution chest CT prior to discharge as patient has missed initial out patient consult and CT scheduled for Monday 8/26. Husband would not rescheduled rheumatology consult when contacted related to "the patient has a lot of other medical concerns that needed to be addressed right now".   Plan: Follow patients progress while inpatient via EMR and schedule home visit upon discharge. If RN CM has difficulty connecting with patient via telephone, will attempt to connect at MD follow-up visit   Shana Zavaleta E. Rollene Rotunda RN, BSN [facility] Care Management Coordinator [phone]

## 2017-10-04 NOTE — Progress Notes (Signed)
Rapid Response Event Note  Overview:  Arrived to room 119 to find patent lying in bed. Primary RN, Agricultural consultant, Supervisor at bedside. Pt having garbled speech. No other distress at this time.      Initial Focused Assessment: Pt having grabbled speech, tongue swelling, some weakness, noted as not present before by primary RN. Reported last known well time was around 0230.  Interventions: MD at bedside, code stroke activated.Pt hooked up to RR monitor and transported to STAT head CT by RRT RNs and Supervisor. Pt transported from CT to ICU 11.  Plan of Care (if not transferred): Will continue Code Stroke protocol in ICU 11. TeleStroke Neurologist will assess patient.  Event Summary:  RR 10/04/17 at [place]     Code Stroke Called 10/04/17 at [place]

## 2017-10-04 NOTE — Consult Note (Addendum)
CTA H and N and P approved by radiology and myself as necessary study.  He will have this and then be transferred to [facility].  Patient has been accepted at that institution after discussion w neurology.  Called transfer center to initiate transfer to [place] - they said they will call me back  Unable to have CTA H and N and P as per radiology due to low GFRA Will proceed to transfer to [place] as per post-tpa protocol.   Date:10/04/17 Ronnald Ramp [employer] TeleNeurology Consult Services  Impression: Cannot exclude b/l occipital and or L mca stroke - pt w worsening aphasia  Symptoms may be consistent with LVO therefore CTA H and N and P recommended.   Differential Diagnosis:  1. Cardioembolic stroke  2. Small vessel disease/lacune  3. Thromboembolic, artery-to-artery mechanism  4. Hypercoagulable state-related infarct  5. Transient ischemic attack  6. Thrombotic mechanism, large artery disease   Comments:   Last known well: 225 Door time:  inpt [employer] contacted: 5:38 [employer] at bedside: 5:46 NIHSS assessment time: 5:46 Verbal tpa order:6:16 BP prior to bolus:153/85 Needle time: 6:42  Verbal Consent to tPA:  I have explained to the patient/family/guardian the nature of the patient's condition, the use of tPA fibrinolytic agent, and the benefits to be reasonably expected compared with alternative approaches. I have discussed the likelihood of major risks or complications of this procedure including (if applicable) but not limited to loss of limb function, brain damage, paralysis, hemorrhage, infection, complications from transfusion of blood components, drug reactions, blood clots and loss of life. I have also indicated that with any procedure there is always the possibility of an unexpected complication. I have explained the risks which include:    1. Death, Stroke or permanent neurologic injury (paralysis, coma, etc)   2. Worsening of stroke symptoms from  swelling or bleeding in the brain   3. Bleeding in other parts of the body   4. Need for blood transfusions to replace blood or clotting factors   5. Allergic reaction to medications   6. Other unexpected complications    All questions were answered and the patient/family/guardian express understanding of the treatment plan and consent to the procedure.  Our recommendations are outlined below.   We will be seeing the patient back in follow up as noted.    Recommendations:   IV tPA - dose = 9+81 Routine post tPA monitoring including neuro checks and blood pressure control during/after treatment Monitor blood pressure Check blood pressure and NIHSS every 15 min for 2 h, then every 30 min for 6 h, and finally every hour for 16 h  Systolic greater than 970 OR diastolic greater than 263: Option 1: Labetalol 10 mg IV for 1 - 2 min May repeat or double labetalol every 10 min to maximum dose of 300 mg, or give initial labetalol dose, then start labetalol drip at 2 - 8 mg/min. Option 2: Nicardipine 5 mg/h IV infusion as initial dose and titrate to desired effect by increasing 2.5 mg/h every 5 min to maximum of 15 mg/h;  If blood pressure is not controlled by labetolol or nicardipine, consider sodium nitroprusside.  Admission to ICU at [facility] (needs transfer) after CTA H and N and P CT brain 24 hours post tPA NPO until swallowing screen performed and passed No antiplatelet agents or anticoagulants (including heparin for DVT prophylaxis) in first 24 hours No Foley catheter, nasogastric tube, arterial catheter or central venous catheter for 24 hr, unless  absolutely necessary Telemetry  Inpatient Neurology Consultation Stroke evaluation as per inpatient neurology recommendations Discussed with ED MD   ------------------------------------------------------------------------------  CC SA  History of Present Illness   Patient is a  77yo W w pmh of copd, anxiety, htn, ckd3, hx of stroke  early May 2018 on plavix (but mri neg for stroke) who was last seen speaking normal at 2:25 and then found at 5:00 unable to speak. Unclear if prior aphasia (this was documented in chart).  No hx of ich, bleeding, cancer, severe head trauma or blood thinners.   Diagnostic HCT NAF Exam   NIHSS score: 8  Level of consciousness:1 LOC questions:0 LOC commands:0 Best Gaze:0 Visual:3 Facial palsy:0 Motor arm - left:0 Motor arm - right:0 Motor leg - left:1 Motor leg - right:0 Limb ataxia:0 Sensory:0 Best language:1 Dysarthria:2 Extinction and inattention:0   Medical Decision Making:  - Extensive number of diagnosis or management options are considered above.   - Extensive amount of complex data reviewed.   - High risk of complication and/or morbidity or mortality are associated with differential diagnostic considerations above.  - There may be Uncertain outcome and increased probability of prolonged functional impairment or high probability of severe prolonged functional impairment associated with some of these differential diagnosis.  Medical Data Reviewed:  1.Data reviewed include clinical labs, radiology,  Medical Tests;   2.Tests results discussed w/performing or interpreting physician;   3.Obtaining/reviewing old medical records;  4.Obtaining case history from another source;  5.Independent review of image, tracing or specimen.   Patient was informed the Neurology Consult would happen via TeleHealth consult by way of interactive audio and video telecommunications and consented to receiving care in this manner.

## 2017-10-04 NOTE — Discharge Summary (Signed)
Physician Discharge Summary  Patient ID: Katherine [name] MRN: [id number] DOB/AGE: 76/16/1942 76 y.o.  Admit date: 09/30/2017 Discharge date: 10/04/2017  Admission Diagnoses:COPD  Discharge Diagnoses:  Active Problems:   Acute on chronic respiratory failure with hypoxia [facility])   Discharged Condition: Guarded  Hospital Course: Katherine [name] is an 77 y.o. female.with a past medical history remarkable for COPD with chronic respiratory failure, hypertension, stage III chronic renal disease, anxiety/depression, gastroesophageal reflux disease who presented to the emergency department with chief complaint of worsening shortness of breath. She states she was feeling short of breath beginning yesterday.She worsened while she was at church, EMS was subsequently called the patient was brought into the emergency department. She was startedonBiPAP, given Solu-Medrol, albuterol and Atrovent. patient improved quickly from a respiratory point of view, was seen by palliative care secondary to 12 admissions in the last 6 months for her severe COPD. Patient wished to remain a full code. Unfortunately code stroke was called early morning secondary to aphasia, and seen by Teleneurology who felt that she could not exclude bilateral occipital or left MCA stroke patient with worsening aphasia. Subsequently receiving TPA, is now receiving a CT angiogram of the head, Teleneurology wishes transfer to [facility] for further evaluation and therapeutics. Discussed with the intensive care unit physician and [facility] who is agreeable and has accepted patients to the neuro ICU  Consults: palliative care, Pulmonary critical care,Neurology  Significant Diagnostic Studies: CT scan of the head, chest x-ray  Treatments:treatment for COPD to include bronchodilators, antibiotics and steroids. Patient with an acute stroke, presently receiving TPA, being transferred to [facility], obtaining CT angiogram of the   Discharge  Exam: Blood pressure 130/87, pulse 66, temperature 98.1 F (36.7 C), resp. rate (!) 26, height 5\' 2"  (1.575 m), weight 123.4 kg, SpO2 97 %. Patient is awake, alert, non verbal Began coughing up blood post TPA HEENT: Patient with history of angioedema, Tongue easily depressable, No evidence of biting or trauma that is noticed. CV: RRR Lungs: Course Rhonchi Bilaterally Abdomen: Positive bowel sounds soft exam Ext: no clubbing cyanosis or edema Neurologic: Aphasic.  Disposition: Transfer to [facility]  Will monitor in ICU prior to transfar make sure bleeding doesn't worsen or respiratory status change.   Hermelinda Dellen, DO Signed: Hermelinda Dellen 10/04/2017, 7:28 AM

## 2017-10-04 NOTE — Progress Notes (Signed)
Report called to [facility] at [facility], pt transported via [facility] on 2 liters [place], NIHSS and VS completed Q6min, Pt's sensation to touch WDL when she left [facility] per her report to [facility].  VSS, Bleeding from oral and nasal cavities minimal at transfer, this bleeding was reported to [place].

## 2017-10-04 NOTE — Progress Notes (Signed)
Chaplain responded to a RR . No family was present. Chaplain made space outside the room and prayed for the care team. Chaplain let informed nurse (on the phone with husband) where Pt was moving to.    10/04/17 0600  Clinical Encounter Type  Visited With Patient  Visit Type Initial;Code  Referral From Nurse  Spiritual Encounters  Spiritual Needs Prayer

## 2017-10-04 NOTE — Plan of Care (Signed)
Pt able to verbally communicate needs to staff and family. Explained how to use of call button.

## 2017-10-04 NOTE — Progress Notes (Signed)
Pt Transported to CT after Rapid Response/ Code Stroke by RRT and Supervisor. Then pt transferred to ICU 11 by RRT and supervisor. Writing RN then activated with Physiological scientist. Verbal comunication given of time last know well, pt status before change, time pt found with weakness and garbled speech, also of pt had received heparin previously. [place] done at bedside by Neurologist on screen, and writing nurse. BG obtained during rapid. ABG being drawn, also labs being drawn, EKG being obtained also. Pt deemed candidate for TPA. Neurologist given pt husband contact to get consent and inform of patient status, done while on screen. Verbal order given, pharmacy activated to begin mixing TPA. IV team consulted by writing RN for placement of 18 gauge for perfusion scan, iv obtained. Stroke coordinator made aware and arriving at bedside, assisting with TPA administration.TPA bolus given and administered per order enter and verbalized by neurologist. Pt going to be transported for CTA. Verbally cleared with Neurologist that they are aware of patient creatine cl. And cleared also by radiologist Watts to proceed with CTA. Pt brought to CT by writing RN, RN assuming care for the shift, and Stroke Coordinator Minette Brine, Therapist, sports. Pt with loss of IV 18G access, not able to do perfusion scan due to access, Neurologist aware. CTA obtained, [facility] given report and arrived for pt pick up, receiving pt.

## 2017-10-04 NOTE — Progress Notes (Signed)
PT Cancellation Note  Patient Details Name: Katherine [name] MRN: [id number] DOB: Dec 07, 1940   Cancelled Treatment:    Reason Eval/Treat Not Completed: Active bedrest order will follow up when appropriate or when bedrest orders are d/ced.    [place] 10/04/2017, 11:16 AM Wray Kearns, PT, DPT [phone]

## 2017-10-04 NOTE — H&P (Addendum)
Neurology history and physical    HPI: Katherine [name] is a 77 y.o. female who was initially brought to [facility] for a COPD exacerbation.  Code stroke was called early in the morning secondary to a aphasia, she was last seen normal at 2:05 in the AM.,  Patient received a CT of the head at approximately 0 5:30 AM on 10/04/2017 which did not show any acute process. Tele-specialist was contacted at 5:38 AM.  tPA was ordered at 6:16 AM.  Prior to administering TPA blood pressure was 153/85.   TPA was administered at 06 42 and then patient was sent at 744 CTA of head and neck was obtained did not show any large vessel occlusion.  Tele-neurology felt that she could not exclude bilateral occipital or left MCA stroke with a patient with worsening a aphasia.  Patient then was transferred to [facility].  In route it was noted that patient was bleeding from her mouth, initially when she coughed he was copious amounts of blood however by the time that she was at [facility] the amount of blood had subsided however she did need to be suctioned multiple times.  There was question if she had some angioedema however per EMS there was also a note that said that she often times did have a large tongue and it was stick out of her mouth.  Due to question of possible aspiration PCCM was consulted.  However patient did have a good cough, in addition to the amount of blood was subsiding.  The decision was made not to intubate and to continue to watch the patient closely.  Patient was alert and oriented enough to suction herself if needed.  Although patient was very hard of hearing she was able to follow commands but did have some problems with repetition.  There was no a aphasia noted initially nor was there any decreased sensation.  LKW: 2:05 AM on 10/04/2017 tpa given?:  Yes via tele-neurology at [place] regional Premorbid modified Rankin scale (mRS): 0 NIH stroke scale of 5 at [facility] stroke  scale of 8 while at [facility] regional   ROS: A 14 point ROS was performed and is negative except as noted in the HPI.    Past Medical History:  Diagnosis Date  . Angioedema   . Anxiety   . Anxiety and depression   . Chronic constipation   . Chronic kidney disease    stage 3  . COPD (chronic obstructive pulmonary disease) ([place])   . Gallstones   . GERD (gastroesophageal reflux disease)   . Hypertension   . Left ventricular hypertrophy   . Osteoarthritis   . Prurigo nodularis   . Tobacco abuse   . Vitamin D deficiency disease     Family History  Problem Relation Age of Onset  . Alcohol abuse Mother   . Sickle cell anemia Daughter   . Hypertension Son   . Cancer Neg Hx   . COPD Neg Hx   . Diabetes Neg Hx   . Heart disease Neg Hx   . Stroke Neg Hx     Social History:   reports that she has quit smoking. Her smoking use included cigarettes. She has a 30.00 pack-year smoking history. She has never used smokeless tobacco. She reports that she does not drink alcohol or use drugs.  Medications  Current Facility-Administered Medications:  .   stroke: mapping our early stages of recovery book, , Does not apply, Once, Marliss Coots,  PA-C .  0.9 %  sodium chloride infusion, , Intravenous, Continuous, Marliss Coots, PA-C .  famotidine (PEPCID) IVPB 20 mg premix, 20 mg, Intravenous, Q12H, Marliss Coots, PA-C .  labetalol (NORMODYNE,TRANDATE) injection 20 mg, 20 mg, Intravenous, Once **AND** nicardipine (CARDENE) 20mg  in 0.86% saline 256ml IV infusion (0.1 mg/ml), 0-15 mg/hr, Intravenous, Continuous, Smith, Loralee Pacas, PA-C .  senna-docusate (Senokot-S) tablet 1 tablet, 1 tablet, Oral, QHS PRN, Marliss Coots, PA-C   Exam: Current vital signs: BP (!) 151/96   Pulse 80   Resp (!) 21   SpO2 99%  Vital signs in last 24 hours: Temp:  [98 F (36.7 C)-98.4 F (36.9 C)] 98.1 F (36.7 C) (08/22 0700) Pulse Rate:  [63-80] 80 (08/22 0930) Resp:  [18-32] 21 (08/22 0930) BP:  (110-169)/(67-97) 151/96 (08/22 0930) SpO2:  [91 %-99 %] 99 % (08/22 0930)  GENERAL: Awake, alert in NAD HEENT: - Normocephalic enlarged tongue and currently having oral bleeding LUNGS - Clear to auscultation bilaterally with no wheezes Ext: warm, well perfused, intact peripheral pulses  NEURO:  Mental Status: Currently she is alert, knows she is in the hospital.  Speech is dysarthric.  Naming intact, difficulty with repetition, difficult with fluency, and she is significantly hard of hearing however when she does hear you comprehension is intact. Cranial Nerves: PERRL 2 mm/brisk. 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) visual fields full, no facial asymmetry, facial sensation intact, hearing intact, tongue/uvula/soft palate midline Motor: Bilateral upper extremities 5/5 with no drift, difficulty with lower extremities she has a 2/5 with her left leg and 1/5 with the right leg bilateral 5/5 with dorsiflexion plantarflexion Tone: is normal and bulk is normal Sensation- Intact to light touch bilaterally Coordination: FTN intact bilaterally,  Gait- deferred   Labs I have reviewed labs in epic and the results pertinent to this consultation are:   CBC    Component Value Date/Time   WBC 8.8 10/03/2017 0349   RBC 3.68 (L) 10/03/2017 0349   HGB 10.4 (L) 10/03/2017 0349   HGB 12.6 03/27/2017 0948   HCT 32.0 (L) 10/03/2017 0349   HCT 37.9 03/27/2017 0948   PLT 187 10/03/2017 0349   PLT 252 03/27/2017 0948   MCV 86.9 10/03/2017 0349   MCV 95 03/27/2017 0948   MCV 94 02/20/2013 1147   MCH 28.2 10/03/2017 0349   MCHC 32.4 10/03/2017 0349   RDW 18.8 (H) 10/03/2017 0349   RDW 16.2 (H) 03/27/2017 0948   RDW 14.5 02/20/2013 1147   LYMPHSABS 1.6 09/30/2017 1235   LYMPHSABS 2.6 03/27/2017 0948   LYMPHSABS 0.8 (L) 01/23/2013 0422   MONOABS 0.5 09/30/2017 1235   MONOABS 0.1 (L) 01/23/2013 0422   EOSABS 0.0 09/30/2017 1235   EOSABS 0.1 03/21/2017 0950    EOSABS 0.0 01/23/2013 0422   BASOSABS 0.0 09/30/2017 1235   BASOSABS 0.0 03/21/2017 0950   BASOSABS 0.0 01/23/2013 0422    CMP     Component Value Date/Time   NA 140 10/02/2017 0509   NA 142 03/21/2017 0950   NA 139 02/20/2013 1147   K 5.1 10/02/2017 0509   K 4.1 02/20/2013 1147   CL 101 10/02/2017 0509   CL 111 (H) 02/20/2013 1147   CO2 31 10/02/2017 0509   CO2 23 02/20/2013 1147   GLUCOSE 317 (H) 10/02/2017 0509   GLUCOSE 115 (H) 02/20/2013 1147   BUN 46 (H) 10/02/2017 0509   BUN 19 03/21/2017 0950  BUN 17 02/20/2013 1147   CREATININE 1.87 (H) 10/02/2017 0509   CREATININE 1.58 (H) 02/20/2013 1147   CALCIUM 8.3 (L) 10/02/2017 0509   CALCIUM 9.0 02/20/2013 1147   PROT 6.4 (L) 09/30/2017 1235   PROT 6.7 03/21/2017 0950   PROT 7.5 01/22/2013 0917   ALBUMIN 3.2 (L) 09/30/2017 1235   ALBUMIN 4.1 03/21/2017 0950   ALBUMIN 3.5 01/22/2013 0917   AST 17 09/30/2017 1235   AST 22 01/22/2013 0917   ALT 22 09/30/2017 1235   ALT 15 01/22/2013 0917   ALKPHOS 67 09/30/2017 1235   ALKPHOS 92 01/22/2013 0917   BILITOT 0.8 09/30/2017 1235   BILITOT 0.2 03/21/2017 0950   BILITOT 0.4 01/22/2013 0917   GFRNONAA 25 (L) 10/02/2017 0509   GFRNONAA 33 (L) 02/20/2013 1147   GFRAA 29 (L) 10/02/2017 0509   GFRAA 38 (L) 02/20/2013 1147    Lipid Panel     Component Value Date/Time   CHOL 171 06/29/2017 0543   CHOL 157 03/21/2017 0950   CHOL 148 06/15/2016 1046   TRIG 91 06/29/2017 0543   TRIG 109 06/15/2016 1046   HDL 50 06/29/2017 0543   HDL 42 03/21/2017 0950   CHOLHDL 3.4 06/29/2017 0543   VLDL 18 06/29/2017 0543   VLDL 22 06/15/2016 1046   LDLCALC 103 (H) 06/29/2017 0543   LDLCALC 85 03/21/2017 0950     Imaging I have reviewed the images obtained:  CT-scan of the brain--no acute intracranial processes CTA of head and neck showed no significant stenosis of large vessels MRI examination of the brain--pending  Assessment: 77 year old female transferred to [facility] after  receiving tPA at [facility] for stroke manifesting with dysphasia, dysarthria and bilateral vision loss. All of the above have resolved except for residual mild dysphasia and dysarthria. Localizatoin is possible left MCA distribution stroke +/- bilateral occipital lobe strokes. DDx for underlying etiology most likely cardioembolic, with artery to artery embolization and in situ thrombosis also considerations.  Recommendations: # MRI of the brain without contrast #Transthoracic Echo, # Start patient on ASA 325mg  daily tomorrow per stroke team if repeat CT head at 24 hours is negative for hemorrhagic conversion. Patient classifiable as having failed Plavix. If she was previously on ASA and had a stroke on monotherapy with this med, then start DAPT.  #Start or continue Atorvastatin 80 mg/other high intensity statin--after LDL has been obtained # BP goal: <180/105 # HBAIC and Lipid profile # Telemetry monitoring # Frequent neuro checks--along with airway checks secondary to oral pharyngeal bleeding post TPA # NPO until passes stroke swallow screen # Please page stroke NP  Or  PA  Or MD from 8am -4 pm  as this patient from this time will be  followed by the stroke.   You can look them up on [email or web address]  Password [email or web address]  60 minutes spent in the emergent neurological evaluation and management of this critically ill acute stroke patient.   I have seen and examined the patient. I have amended the assessment and plan above.  Electronically signed: Dr. Kerney Elbe

## 2017-10-04 NOTE — Consult Note (Addendum)
[name]  KYH:[id number] DOB: [birth year] DOA: 10/04/2017 PCP: Valerie Roys, DO    Reason for Consult/Chief Complaint:  Risk for respiratory failure and concern about airway protection  Consulting MD:  Cheral Marker   HPI/Brief Narrative   77 year old African-American female with significant history of: Chronic respiratory failure, COPD, stage III chronic renal disease, anxiety and depression, hypertension and GERD.  Initially admitted with working diagnosis of acute on chronic respiratory failure in the setting of COPD exacerbation.  Had been treated in the usual fashion with BiPAP therapy, systemic steroids, bronchodilators, empiric doxycycline and supportive care had been moving out of the intensive care As of 8/21.   8/22: Found at 0500 aphasic.  Had last been seen normal at 2:25 AM.  NIHSS score of 8.  Code stroke was called.  Evaluated by telemetry neurology who is concerned about bilateral occipital versus left MCA stroke.  CT angiogram was negative, received IV TPA at [phone], transferred to [facility] for stroke care.  After receiving TPA noted to be bleeding from nasal and oral cavity , Coughing up blood.  Pulmonary critical care asked to be on board shortly after arrival given concern for potential airway compromise.  Assessment & Plan:  Acute Stroke: S/p TPA at [phone] on 8/22 Plan Admit to stroke service Serial neuro checks BP goal SBP<170 and DBP <100->has PRN nicardipine  F/u neuro diagnositics: CT, MRI, carotid US Holding secondary stroke intervention for now; start ASA likely 8/23  Rehab services per stroke team protocol   Chronic hypoxic respiratory failure w/ resolving AECOPD. OHS (FEV1:  1.38 L (83 %pred), FEV1/FVC: 75%)  -completed 5d abx  Plan Scheduled BDs Taper steroids (IV solumedrol) Pulse ox Wean O2  holding BIPAP given epistaxis   Epistaxis s/p TPA Plan Supportive care.   CKD stage III (looks like baseline cr 1.7 to 2.12) Plan Avoid  hypotension Renal dose neds Repeat am chem  Strict I&O   H/o HTN Plan Holding her home cardiazem and norvasc for now Cont tele   DM type II w/ hyperglycemia  Plan ssi   Mild anemia  Plan Trend CBC   DVT prophylaxis: SCDs; start [place] heparin in 24 hrs after admit  GI prophylaxis: H2B Diet: NPO Mobility:BR Code Status: full code  Family Communication: pending   Disposition/ summary of today's plan 10/04/17   Admitted to Neuro ICU s/p acute CVA. Got TPA at [phone] today. We were asked to given concern for airway protection. Currently cough is strong, she is alert and oriented. She does have some blood w/ her cough but she is clearing this. Will watch closely no need for intubation for now.   Consultants:  Neurology 8/22 Had been followed by palliative care medicine since 8/18 given recurrent hospital admits for COPD  Procedures:   Significant diagnostic tests: CT head/neck: 22: 8 aortic arterial sclerosis.  Brachiocephalic vessel tortuous suggesting history of hypertension.  Arteriosclerotic plaque in the carotid bifurcations.  No intracranial stenosis or occlusion.  No acute stroke. MRI brain 8/23>>> ECHO 8/22>>>  Micro data:   Antimicrobials:  Doxycycline started 8/18>>>stopped 8/22 Rocephin 8/22>>>  Subjective  Feels better   Objective    Blood pressure (Abnormal) 151/96, pulse 80, resp. rate (Abnormal) 21, SpO2 99 %.       No intake or output data in the 24 hours ending 10/04/17 1000 There were no vitals filed for this visit.  Examination: General: obese 77 year old aaf. Sp slurred. Hard of hearing. Oriented X 4  HENT: NCAT. Tongue is large but not oral swelling. Coughing bloody sputum  Lungs: CTA w/out wheeze. No accessory use  Cardiovascular: RRR w/out MRG Abdomen: soft not tender + bowel sounds  Extremities: right side weaker otherwise nml  Neuro: awake, some slurred/dysarthric speech. Oriented x4 GU: due to void   Labs   CBC: Recent Labs  Lab  09/30/17 1235 10/03/17 0349  WBC 8.0 8.8  NEUTROABS 5.8  --   HGB 11.3* 10.4*  HCT 33.9* 32.0*  MCV 85.5 86.9  PLT 167 174   Basic Metabolic Panel: Recent Labs  Lab 09/30/17 1235 10/01/17 0500 10/01/17 1347 10/02/17 0509  NA 143 141 138 140  K 4.4 5.6* 5.1 5.1  CL 102 99 95* 101  CO2 32 31 29 31   GLUCOSE 145* 315* 365* 317*  BUN 32* 41* 42* 46*  CREATININE 1.72* 2.07* 2.12* 1.87*  CALCIUM 8.8* 8.4* 8.3* 8.3*  MG  --  1.8  --   --   PHOS  --  5.2*  --   --    GFR: Estimated Creatinine Clearance: 32.1 mL/min (A) (by C-G formula based on SCr of 1.87 mg/dL (H)). Recent Labs  Lab 09/30/17 1235 10/03/17 0349  WBC 8.0 8.8   Liver Function Tests: Recent Labs  Lab 09/30/17 1235  AST 17  ALT 22  ALKPHOS 67  BILITOT 0.8  PROT 6.4*  ALBUMIN 3.2*   No results for input(s): LIPASE, AMYLASE in the last 168 hours. No results for input(s): AMMONIA in the last 168 hours. ABG    Component Value Date/Time   PHART 7.33 (L) 10/04/2017 0609   PCO2ART 59 (H) 10/04/2017 0609   PO2ART 58 (L) 10/04/2017 0609   HCO3 31.1 (H) 10/04/2017 0609   ACIDBASEDEF 2.8 (H) 06/12/2017 1250   O2SAT 87.6 10/04/2017 0609    Coagulation Profile: No results for input(s): INR, PROTIME in the last 168 hours. Cardiac Enzymes: Recent Labs  Lab 09/30/17 1751  TROPONINI 0.03*   HbA1C: Hgb A1c MFr Bld  Date/Time Value Ref Range Status  10/01/2017 03:07 PM 7.6 (H) 4.8 - 5.6 % Final    Comment:    (NOTE) Pre diabetes:          5.7%-6.4% Diabetes:              >6.4% Glycemic control for   <7.0% adults with diabetes   09/12/2017 03:39 AM 6.8 (H) 4.8 - 5.6 % Final    Comment:    (NOTE) Pre diabetes:          5.7%-6.4% Diabetes:              >6.4% Glycemic control for   <7.0% adults with diabetes    CBG: Recent Labs  Lab 10/03/17 0742 10/03/17 1158 10/03/17 1646 10/03/17 2137 10/04/17 0527  GLUCAP 182* 293* 211* 230* 236*     Review of Systems:     Past medical history    Past Medical History:  Diagnosis Date  . Angioedema   . Anxiety   . Anxiety and depression   . Chronic constipation   . Chronic kidney disease    stage 3  . COPD (chronic obstructive pulmonary disease) ([place])   . Gallstones   . GERD (gastroesophageal reflux disease)   . Hypertension   . Left ventricular hypertrophy   . Osteoarthritis   . Prurigo nodularis   . Tobacco abuse   . Vitamin D deficiency disease    Social History   Social History  Socioeconomic History  . Marital status: Married    Spouse name: [name]   . Number of children: 3  . Years of education: Not on file  . Highest education level: 11th grade  Occupational History  . Occupation: retired  Scientific laboratory technician  . Financial resource strain: Not hard at all  . Food insecurity:    Worry: Never true    Inability: Never true  . Transportation needs:    Medical: No    Non-medical: No  Tobacco Use  . Smoking status: Former Smoker    Packs/day: 0.50    Years: 60.00    Pack years: 30.00    Types: Cigarettes  . Smokeless tobacco: Never Used  Substance and Sexual Activity  . Alcohol use: No    Alcohol/week: 0.0 standard drinks    Comment: rare  . Drug use: No  . Sexual activity: Yes  Lifestyle  . Physical activity:    Days per week: 7 days    Minutes per session: 20 min  . Stress: Not at all  Relationships  . Social connections:    Talks on phone: More than three times a week    Gets together: Once a week    Attends religious service: More than 4 times per year    Active member of club or organization: No    Attends meetings of clubs or organizations: Never    Relationship status: Married  . Intimate partner violence:    Fear of current or ex partner: No    Emotionally abused: No    Physically abused: No    Forced sexual activity: No  Other Topics Concern  . Not on file  Social History Narrative  . Not on file    Family history    Family History  Problem Relation Age of Onset  . Alcohol  abuse Mother   . Sickle cell anemia Daughter   . Hypertension Son   . Cancer Neg Hx   . COPD Neg Hx   . Diabetes Neg Hx   . Heart disease Neg Hx   . Stroke Neg Hx     Allergies Allergies  Allergen Reactions  . Bee Venom Swelling  . Enalapril Maleate Swelling    Home meds  Prior to Admission medications   Not on File     LOS: 0 days

## 2017-10-04 NOTE — Code Documentation (Signed)
While returning from CT, pt began spitting up bright red blood, Dr. Jefferson Fuel notified and brought to bedside upon pts return to the ICU, pt suctioned, tele-neuro made aware

## 2017-10-04 NOTE — Code Documentation (Signed)
Pt left ICU with [facility]

## 2017-10-04 NOTE — Code Documentation (Signed)
Arrived to unit at 0645, upon arrival to ICU pharmacy at bedside with tPA, Charge RN Ayr hung tPA per Dr. Roanna Raider, tPA bolus given at [phone], Pt taken back to CT for CTA/ CTP. Unable to preform CTP due to 18g IV extravasated, Tele-neuro made aware, CTA completed, upon returning to ICU pt began bleeding from her mouth, Dr. Jefferson Fuel made aware, pt suctioned, bleeding noticed from IV sites, tPA and NS hung at 0730, family at bedside aware of pending transfer, pt accepted at [facility], pt left unit with [facility] at [phone]

## 2017-10-05 ENCOUNTER — Inpatient Hospital Stay (HOSPITAL_COMMUNITY): Payer: Medicare HMO

## 2017-10-05 ENCOUNTER — Encounter (HOSPITAL_COMMUNITY): Payer: Self-pay | Admitting: *Deleted

## 2017-10-05 DIAGNOSIS — J9600 Acute respiratory failure, unspecified whether with hypoxia or hypercapnia: Secondary | ICD-10-CM

## 2017-10-05 DIAGNOSIS — I639 Cerebral infarction, unspecified: Secondary | ICD-10-CM

## 2017-10-05 LAB — ECHOCARDIOGRAM COMPLETE
HEIGHTINCHES: 62 in
WEIGHTICAEL: 4437.42 [oz_av]

## 2017-10-05 LAB — LIPID PANEL
Cholesterol: 183 mg/dL (ref 0–200)
HDL: 89 mg/dL (ref >40)
LDL Cholesterol: 73 mg/dL (ref 0–99)
TRIGLYCERIDES: 105 mg/dL (ref <150)
Total CHOL/HDL Ratio: 2.1 RATIO
VLDL: 21 mg/dL (ref 0–40)

## 2017-10-05 LAB — GLUCOSE, CAPILLARY
GLUCOSE-CAPILLARY: 167 mg/dL — AB (ref 70–99)
Glucose-Capillary: 150 mg/dL — ABNORMAL HIGH (ref 70–99)
Glucose-Capillary: 152 mg/dL — ABNORMAL HIGH (ref 70–99)
Glucose-Capillary: 162 mg/dL — ABNORMAL HIGH (ref 70–99)
Glucose-Capillary: 336 mg/dL — ABNORMAL HIGH (ref 70–99)
Glucose-Capillary: 353 mg/dL — ABNORMAL HIGH (ref 70–99)

## 2017-10-05 LAB — HEMOGLOBIN A1C
HEMOGLOBIN A1C: 7.7 % — AB (ref 4.8–5.6)
MEAN PLASMA GLUCOSE: 174.29 mg/dL

## 2017-10-05 MED ORDER — LEVETIRACETAM ER 500 MG PO TB24
1000.0000 mg | ORAL_TABLET | Freq: Every day | ORAL | Status: DC
  Administered 2017-10-06 – 2017-10-10 (×5): 1000 mg via ORAL
  Filled 2017-10-05 (×5): qty 2

## 2017-10-05 MED ORDER — CLOPIDOGREL BISULFATE 75 MG PO TABS
75.0000 mg | ORAL_TABLET | Freq: Every day | ORAL | Status: DC
  Administered 2017-10-05 – 2017-10-10 (×6): 75 mg via ORAL
  Filled 2017-10-05 (×6): qty 1

## 2017-10-05 MED ORDER — LEVETIRACETAM IN NACL 1000 MG/100ML IV SOLN
1000.0000 mg | Freq: Once | INTRAVENOUS | Status: AC
  Administered 2017-10-05: 1000 mg via INTRAVENOUS
  Filled 2017-10-05: qty 100

## 2017-10-05 MED ORDER — PANTOPRAZOLE SODIUM 40 MG PO TBEC
40.0000 mg | DELAYED_RELEASE_TABLET | Freq: Every day | ORAL | Status: DC
  Administered 2017-10-05 – 2017-10-10 (×6): 40 mg via ORAL
  Filled 2017-10-05 (×6): qty 1

## 2017-10-05 NOTE — Progress Notes (Addendum)
Katherine [name]  DJS:[id number] DOB: April 13, 1940 DOA: 10/04/2017 PCP: Valerie Roys, DO    Reason for Consult/Chief Complaint:  Risk for respiratory failure and concern about airway protection  Consulting MD:  Cheral Marker MD, Neurology  HPI/Brief Narrative   77 year old African-American female with significant history of: Chronic respiratory failure, COPD, stage III chronic renal disease, anxiety and depression, hypertension and GERD.  Initially admitted with working diagnosis of acute on chronic respiratory failure in the setting of COPD exacerbation.  Had been treated in the usual fashion with BiPAP therapy, systemic steroids, bronchodilators, empiric doxycycline and supportive care had been moving out of the intensive care as of 8/21.    8/22: Found at 0500 aphasic.  Had last been seen normal at 2:25 AM.  NIHSS score of 8.  Code stroke was called.  Evaluated by telemetry neurology who is concerned about bilateral occipital versus left MCA stroke.  CT angiogram was negative, received IV TPA at [phone], transferred to [facility] for stroke care.  After receiving TPA noted to be bleeding from nasal and oral cavity , Coughing up blood.  Pulmonary critical care asked to be on board shortly after arrival given concern for potential airway compromise.  Assessment & Plan:  Acute Stroke: S/p TPA at 2774 on 8/22 Plan Discussed with Dr. Leonie Man, Neuro.  MRI does not show any acute intracranial process Serial neuro checks BP goal SBP<170 and DBP <100->has PRN nicardipine  Continue plavix  Chronic hypoxic respiratory failure w/ resolving AECOPD. OHS (FEV1:  1.38 L (83 %pred), FEV1/FVC: 75%)  -completed 5d abx No wheeze on examination today  Plan Scheduled BDs. Can discharge on home telegy inhaler. Can start tapering steroids. Change to prednisone 40 mg. Reduce dose by 10 mg every 3 days Wean O2  Epistaxis s/p TPA Plan Supportive care  CKD stage III (looks like baseline cr 1.7 to  2.12) Plan Monitor urine output and Cr  H/o HTN Plan Holding her home cardiazem and norvasc for now Cont tele   DM type II w/ hyperglycemia  Plan SSI  Mild anemia  Plan Trend CBC   DVT prophylaxis: SCDs; start [place] heparin in 24 hrs after admit  GI prophylaxis: H2B Diet: NPO Mobility:BR Code Status: Full code  Family Communication: Husband updated at bedside.   PCCM will be available as needed during this admission. Please call with questions.  Disposition/ summary of today's plan 10/05/17   Improving resp status.  Plan to transfer out of ICU today  Consultants:  Neurology 8/22 Had been followed by palliative care medicine since 8/18 given recurrent hospital admits for COPD  Procedures:   Significant diagnostic tests: CT head/neck: 22: 8 aortic arterial sclerosis.  Brachiocephalic vessel tortuous suggesting history of hypertension.  Arteriosclerotic plaque in the carotid bifurcations.  No intracranial stenosis or occlusion.  No acute stroke. MRI brain 8/23>> Negative for acute intracranial process ECHO 8/23>> LVEF 60-65%.  No wall motion abnormalities.  Micro data:   Antimicrobials:  Doxycycline started 8/18>>>stopped 8/22 Rocephin 8/22>>>  Subjective  No acute events overnight.  Objective    Blood pressure (!) 161/97, pulse 73, temperature 97.9 F (36.6 C), temperature source Oral, resp. rate 16, height 5\' 2"  (1.575 m), weight 125.8 kg, SpO2 99 %.        Intake/Output Summary (Last 24 hours) at 10/05/2017 1029 Last data filed at 10/05/2017 0800 Gross per 24 hour  Intake 608.94 ml  Output 1500 ml  Net -891.06 ml   [employer]  10/04/17 1400  Weight: 125.8 kg    Examination: Gen:      No acute distress, obese HEENT:  EOMI, sclera anicteric Neck:     No masses; no thyromegaly Lungs:    No wheezing, crackles. CV:         Regular rate and rhythm; no murmurs Abd:      + bowel sounds; soft, non-tender; no palpable masses, no distension Ext:    No  edema; adequate peripheral perfusion Skin:      Warm and dry; no rash Neuro: Awake, slightly slurred speech.  Oriented x4.  Labs   CBC: Recent Labs  Lab 09/30/17 1235 10/03/17 0349  WBC 8.0 8.8  NEUTROABS 5.8  --   HGB 11.3* 10.4*  HCT 33.9* 32.0*  MCV 85.5 86.9  PLT 167 505   Basic Metabolic Panel: Recent Labs  Lab 09/30/17 1235 10/01/17 0500 10/01/17 1347 10/02/17 0509  NA 143 141 138 140  K 4.4 5.6* 5.1 5.1  CL 102 99 95* 101  CO2 32 31 29 31   GLUCOSE 145* 315* 365* 317*  BUN 32* 41* 42* 46*  CREATININE 1.72* 2.07* 2.12* 1.87*  CALCIUM 8.8* 8.4* 8.3* 8.3*  MG  --  1.8  --   --   PHOS  --  5.2*  --   --    GFR: Estimated Creatinine Clearance: 32.5 mL/min (A) (by C-G formula based on SCr of 1.87 mg/dL (H)). Recent Labs  Lab 09/30/17 1235 10/03/17 0349  WBC 8.0 8.8   Liver Function Tests: Recent Labs  Lab 09/30/17 1235  AST 17  ALT 22  ALKPHOS 67  BILITOT 0.8  PROT 6.4*  ALBUMIN 3.2*   No results for input(s): LIPASE, AMYLASE in the last 168 hours. No results for input(s): AMMONIA in the last 168 hours. ABG    Component Value Date/Time   PHART 7.33 (L) 10/04/2017 0609   PCO2ART 59 (H) 10/04/2017 0609   PO2ART 58 (L) 10/04/2017 0609   HCO3 31.1 (H) 10/04/2017 0609   ACIDBASEDEF 2.8 (H) 06/12/2017 1250   O2SAT 87.6 10/04/2017 0609    Coagulation Profile: No results for input(s): INR, PROTIME in the last 168 hours. Cardiac Enzymes: Recent Labs  Lab 09/30/17 1751  TROPONINI 0.03*   HbA1C: Hgb A1c MFr Bld  Date/Time Value Ref Range Status  10/05/2017 06:31 AM 7.7 (H) 4.8 - 5.6 % Final    Comment:    (NOTE) Pre diabetes:          5.7%-6.4% Diabetes:              >6.4% Glycemic control for   <7.0% adults with diabetes   10/01/2017 03:07 PM 7.6 (H) 4.8 - 5.6 % Final    Comment:    (NOTE) Pre diabetes:          5.7%-6.4% Diabetes:              >6.4% Glycemic control for   <7.0% adults with diabetes    CBG: Recent Labs  Lab  10/04/17 1604 10/04/17 2010 10/05/17 0027 10/05/17 0259 10/05/17 0828  GLUCAP 172* 146* 152* 167* 150*     Review of Systems:   REVIEW OF SYSTEMS:   All negative; except for those that are bolded, which indicate positives.  Constitutional: weight loss, weight gain, night sweats, fevers, chills, fatigue, weakness.  HEENT: headaches, sore throat, sneezing, nasal congestion, post nasal drip, difficulty swallowing, tooth/dental problems, visual complaints, visual changes, ear aches. Neuro: difficulty with speech, weakness,  numbness, ataxia. CV:  chest pain, orthopnea, PND, swelling in lower extremities, dizziness, palpitations, syncope.  Resp: cough, hemoptysis, dyspnea, wheezing. GI: heartburn, indigestion, abdominal pain, nausea, vomiting, diarrhea, constipation, change in bowel habits, loss of appetite, hematemesis, melena, hematochezia.  GU: dysuria, change in color of urine, urgency or frequency, flank pain, hematuria. MSK: joint pain or swelling, decreased range of motion. Psych: change in mood or affect, depression, anxiety, suicidal ideations, homicidal ideations. Skin: rash, itching, bruising.  Past medical history   Past Medical History:  Diagnosis Date  . Angioedema   . Anxiety   . Anxiety and depression   . Chronic constipation   . Chronic kidney disease    stage 3  . COPD (chronic obstructive pulmonary disease) ([place])   . Gallstones   . GERD (gastroesophageal reflux disease)   . Hypertension   . Left ventricular hypertrophy   . Osteoarthritis   . Prurigo nodularis   . Tobacco abuse   . Vitamin D deficiency disease

## 2017-10-05 NOTE — Evaluation (Signed)
Occupational Therapy Evaluation Patient Details Name: Katherine [name] MRN: [id number] DOB: 12/08/1940 Today's Date: 10/05/2017    History of Present Illness 77 yo female admitted with admitted with acute on chronic respiratory failure from COPD exacerbation. She developed acute onset of aphasia on 8/22, s/p tPA with resultant epistaxis/hemoptysis. MRI showed no acute changes. PMH: COPD, chronic renal disease, hypertension, GERD, anxiety, HTN    Clinical Impression   Patient evaluated by Occupational Therapy with no further acute OT needs identified. All education has been completed and the patient has no further questions. See below for any follow-up Occupational Therapy or equipment needs. OT to sign off. Thank you for referral.      Follow Up Recommendations  SNF    Equipment Recommendations  None recommended by OT    Recommendations for Other Services Other (comment)(home aide)     Precautions / Restrictions Precautions Precautions: Fall Precaution Comments: no falls reports by family and denies any falls in the year Restrictions Weight Bearing Restrictions: No      Mobility Bed Mobility Overal bed mobility: Modified Independent             General bed mobility comments: exiting the bed on the R side  Transfers Overall transfer level: Needs assistance Equipment used: Rolling walker (2 wheeled) Transfers: Sit to/from Stand Sit to Stand: Min assist         General transfer comment: cues for hand placement and use of RW. educated multiple times about use of RW    Balance Overall balance assessment: Needs assistance   Sitting balance-Leahy Scale: Fair       Standing balance-Leahy Scale: Fair                             ADL either performed or assessed with clinical judgement   ADL Overall ADL's : Needs assistance/impaired Eating/Feeding: Set up   Grooming: Set up   Upper Body Bathing: Minimal assistance   Lower Body Bathing: Maximal  assistance   Upper Body Dressing : Minimal assistance   Lower Body Dressing: Maximal assistance Lower Body Dressing Details (indicate cue type and reason): at baseline the spouse completes task. pt and spouse report that she becomes dizzines if leaning over Toilet Transfer: Minimal assistance;BSC Toilet Transfer Details (indicate cue type and reason): pt hand held to bathroom / attempting to use iv pole to simulate cane. pt using RW to exit due to balance deficits Toileting- Clothing Manipulation and Hygiene: Total assistance Toileting - Clothing Manipulation Details (indicate cue type and reason): pt able to static stand but requires (A) for balance     Functional mobility during ADLs: Rolling walker;Minimal assistance       Vision Baseline Vision/History: Wears glasses Wears Glasses: At all times Additional Comments: pt states i only use them to see the TV     Perception     Praxis      Pertinent Vitals/Pain Pain Assessment: No/denies pain     Hand Dominance Right   Extremity/Trunk Assessment Upper Extremity Assessment Upper Extremity Assessment: Generalized weakness   Lower Extremity Assessment Lower Extremity Assessment: Generalized weakness   Cervical / Trunk Assessment Cervical / Trunk Assessment: Kyphotic   Communication Communication Communication: HOH   Cognition Arousal/Alertness: Awake/alert Behavior During Therapy: WFL for tasks assessed/performed Overall Cognitive Status: History of cognitive impairments - at baseline  General Comments: A&O x4.  Follows commands consistently.   General Comments  pt with hx of COPD. pt requires x2 rest breaks to complete 25 ft ambulation 2L     Exercises     Shoulder Instructions      Home Living Family/patient expects to be discharged to:: Private residence Living Arrangements: Spouse/significant other Available Help at Discharge: Family;Available 24 hours/day Type  of Home: Apartment Home Access: Level entry Entrance Stairs-Number of Steps: 0 Entrance Stairs-Rails: None Home Layout: One level     Bathroom Shower/Tub: Teacher, early years/pre: Standard Bathroom Accessibility: Yes   Home Equipment: Walker - 2 wheels;Cane - single point;Bedside commode;Shower seat      Lives With: Spouse    Prior Functioning/Environment Level of Independence: Needs assistance  Gait / Transfers Assistance Needed: cane in and out -  ADL's / Homemaking Assistance Needed: assisted to bath and able to sit to do UB and spouse does LB   Comments: goes to church and gets out and about minimally HOME O2 2 L.         OT Problem List: Decreased activity tolerance;Decreased knowledge of precautions;Decreased knowledge of use of DME or AE;Decreased safety awareness;Obesity;Cardiopulmonary status limiting activity      OT Treatment/Interventions:      OT Goals(Current goals can be found in the care plan section) Acute Rehab OT Goals Patient Stated Goal: to get rehab OT Goal Formulation: With patient/family  OT Frequency:     Barriers to D/C:            Co-evaluation              AM-PAC PT "6 Clicks" Daily Activity     Outcome Measure Help from another person eating meals?: None Help from another person taking care of personal grooming?: None Help from another person toileting, which includes using toliet, bedpan, or urinal?: A Little Help from another person bathing (including washing, rinsing, drying)?: A Little Help from another person to put on and taking off regular upper body clothing?: A Little Help from another person to put on and taking off regular lower body clothing?: A Little 6 Click Score: 20   End of Session Equipment Utilized During Treatment: Oxygen;Rolling walker Nurse Communication: Mobility status;Precautions  Activity Tolerance: Patient tolerated treatment well Patient left: in chair;with call bell/phone within  reach;with chair alarm set;with family/visitor present  OT Visit Diagnosis: Other abnormalities of gait and mobility (R26.89);Muscle weakness (generalized) (M62.81)                Time: 9450-3888 OT Time Calculation (min): 33 min Charges:  OT General Charges $OT Visit: 1 Visit OT Evaluation $OT Eval Moderate Complexity: 1 Mod   Landy, Mace   OTR/L Pager: [phone] Office: [phone] .   Parke Poisson B 10/05/2017, 1:58 PM

## 2017-10-05 NOTE — Progress Notes (Signed)
  Echocardiogram 2D Echocardiogram has been performed.  Merrie Roof F 10/05/2017, 11:32 AM

## 2017-10-05 NOTE — Evaluation (Signed)
Physical Therapy Evaluation Patient Details Name: Katherine [name] MRN: [id number] DOB: 1940/11/25 Today's Date: 10/05/2017   History of Present Illness  77 yo female admitted with admitted with acute on chronic respiratory failure from COPD exacerbation. She developed acute onset of aphasia on 8/22, s/p tPA with resultant epistaxis/hemoptysis. MRI showed no acute changes. PMH: COPD, chronic renal disease, hypertension, GERD, anxiety, HTN   Clinical Impression  Pt admitted with/for s/s of stroke.  Tpa given.  Pt had resolution of much of the symptoms, but still is rather deconditioned, needing min assist for all mobility OOB..  Pt currently limited functionally due to the problems listed. ( See problems list.)   Pt will benefit from PT to maximize function and safety in order to get ready for next venue listed below.     Follow Up Recommendations SNF    Equipment Recommendations  None recommended by PT    Recommendations for Other Services       Precautions / Restrictions Precautions Precautions: Fall Precaution Comments: no falls reports by family and denies any falls in the year Restrictions Weight Bearing Restrictions: No      Mobility  Bed Mobility Overal bed mobility: Modified Independent             General bed mobility comments: exiting the bed on the R side  Transfers Overall transfer level: Needs assistance Equipment used: Rolling walker (2 wheeled) Transfers: Sit to/from Stand Sit to Stand: Min assist         General transfer comment: cues for hand placement and use of RW. educated multiple times about use of RW  Ambulation/Gait Ambulation/Gait assistance: Min Web designer (Feet): 10 Feet(with IV pole and 25 feet with the RW) Assistive device: Rolling walker (2 wheeled);IV Pole Gait Pattern/deviations: Step-through pattern Gait velocity: decreased Gait velocity interpretation: <1.31 ft/sec, indicative of household ambulator General Gait  Details: pt's gait characterized by flexed posture, short step length and mild unsteadiness when using an AD in one hand only.  Much improved stability with RW, cues for best use of RW, but short steps and flexed posture remain.  Stairs            Wheelchair Mobility    Modified Rankin (Stroke Patients Only)       Balance Overall balance assessment: Needs assistance   Sitting balance-Leahy Scale: Fair       Standing balance-Leahy Scale: Fair Standing balance comment: assistive device much preferable for safety                             Pertinent Vitals/Pain Pain Assessment: No/denies pain    Home Living Family/patient expects to be discharged to:: Private residence Living Arrangements: Spouse/significant other Available Help at Discharge: Family;Available 24 hours/day Type of Home: Apartment Home Access: Level entry Entrance Stairs-Rails: None Entrance Stairs-Number of Steps: 0 Home Layout: One level Home Equipment: Walker - 2 wheels;Cane - single point;Bedside commode;Shower seat      Prior Function Level of Independence: Needs assistance   Gait / Transfers Assistance Needed: cane in and out -   ADL's / Homemaking Assistance Needed: assisted to bath and able to sit to do UB and spouse does LB  Comments: goes to church and gets out and about minimally HOME O2 2 L.      Hand Dominance   Dominant Hand: Right    Extremity/Trunk Assessment   Upper Extremity Assessment Upper Extremity Assessment: Generalized weakness  Lower Extremity Assessment Lower Extremity Assessment: Generalized weakness    Cervical / Trunk Assessment Cervical / Trunk Assessment: Kyphotic  Communication   Communication: HOH  Cognition Arousal/Alertness: Awake/alert Behavior During Therapy: WFL for tasks assessed/performed Overall Cognitive Status: Within Functional Limits for tasks assessed(baseline and family agre=es)                                  General Comments: A&O x4.  Follows commands consistently.      General Comments General comments (skin integrity, edema, etc.): pt required 2 standing rest breaks over the course of 25 feet.    Exercises     Assessment/Plan    PT Assessment Patient needs continued PT services  PT Problem List Decreased strength;Decreased activity tolerance;Decreased balance;Decreased mobility;Cardiopulmonary status limiting activity       PT Treatment Interventions DME instruction;Gait training;Functional mobility training;Therapeutic activities;Balance training;Patient/family education    PT Goals (Current goals can be found in the Care Plan section)  Acute Rehab PT Goals Patient Stated Goal: to get rehab PT Goal Formulation: With patient Time For Goal Achievement: 10/12/17 Potential to Achieve Goals: Good    Frequency Min 2X/week   Barriers to discharge        Co-evaluation               AM-PAC PT "6 Clicks" Daily Activity  Outcome Measure Difficulty turning over in bed (including adjusting bedclothes, sheets and blankets)?: A Little Difficulty moving from lying on back to sitting on the side of the bed? : A Little Difficulty sitting down on and standing up from a chair with arms (e.g., wheelchair, bedside commode, etc,.)?: Unable Help needed moving to and from a bed to chair (including a wheelchair)?: A Little Help needed walking in hospital room?: A Little Help needed climbing 3-5 steps with a railing? : A Lot 6 Click Score: 9    End of Session Equipment Utilized During Treatment: Oxygen Activity Tolerance: Patient limited by fatigue Patient left: in chair;with call bell/phone within reach;with family/visitor present Nurse Communication: Mobility status PT Visit Diagnosis: Unsteadiness on feet (R26.81);Difficulty in walking, not elsewhere classified (R26.2)    Time: 1610-9604 PT Time Calculation (min) (ACUTE ONLY): 33 min   Charges:   PT Evaluation $PT Eval  Moderate Complexity: 1 Mod          10/05/2017  Donnella Sham, PT [phone]  (pager)  Tessie Fass Tytionna Cloyd 10/05/2017, 5:32 PM

## 2017-10-05 NOTE — Evaluation (Signed)
Clinical/Bedside Swallow Evaluation Patient Details  Name: Katherine [name] MRN: [id number] Date of Birth: 1940-05-06  Today's Date: 10/05/2017 Time: SLP Start Time (ACUTE ONLY): 1022 SLP Stop Time (ACUTE ONLY): 1035 SLP Time Calculation (min) (ACUTE ONLY): 13 min  Past Medical History:  Past Medical History:  Diagnosis Date  . Angioedema   . Anxiety   . Anxiety and depression   . Chronic constipation   . Chronic kidney disease    stage 3  . COPD (chronic obstructive pulmonary disease) ([place])   . Gallstones   . GERD (gastroesophageal reflux disease)   . Hypertension   . Left ventricular hypertrophy   . Osteoarthritis   . Prurigo nodularis   . Tobacco abuse   . Vitamin D deficiency disease    Past Surgical History:  Past Surgical History:  Procedure Laterality Date  . CHOLECYSTECTOMY    . 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   Procedure: TOTAL KNEE ARTHROPLASTY;  Surgeon: Hessie Knows, MD;  Location: [facility] ORS;  Service: Orthopedics;  Laterality: Right;   HPI:  Pt is a 77 year old female admitted with acute on chronic respiratory failure from COPD exacerbation. She developed acute onset of aphasia on 8/22, s/p tPA with resultant epistaxis/hemoptysis. MRI showed no acute changes. PMH: COPD, chronic renal disease, hypertension, GERD, anxiety, HTN   Assessment / Plan / Recommendation Clinical Impression  Pt presents with prolonged bolus preparation that is felt to be baseline given her lack of dentition. She does not have overt signs of aspiration although she does have a tendency to talk with her mouth still full of food. Her husband says that he tends to prepare her foods that are softer, although I cannot get a clear description of what these textures look like. Recommend starting with Dys 3 diet, thin liquids. If diet needs to be softened further, this can be done without additional SLP reassessment. Will sign off. SLP Visit Diagnosis:  Dysphagia, oral phase (R13.11)    Aspiration Risk  Mild aspiration risk    Diet Recommendation Dysphagia 3 (Mech soft);Thin liquid   Liquid Administration via: Cup;Straw Medication Administration: Whole meds with liquid Supervision: Patient able to self feed;Intermittent supervision to cue for compensatory strategies Compensations: Minimize environmental distractions;Slow rate;Small sips/bites Postural Changes: Seated upright at 90 degrees    Other  Recommendations Oral Care Recommendations: Oral care BID   Follow up Recommendations 24 hour supervision/assistance      Frequency and Duration            Prognosis Prognosis for Safe Diet Advancement: Good      Swallow Study   General HPI: Pt is a 77 year old female admitted with acute on chronic respiratory failure from COPD exacerbation. She developed acute onset of aphasia on 8/22, s/p tPA with resultant epistaxis/hemoptysis. MRI showed no acute changes. PMH: COPD, chronic renal disease, hypertension, GERD, anxiety, HTN Type of Study: Bedside Swallow Evaluation Previous Swallow Assessment: none in chart Diet Prior to this Study: NPO Temperature Spikes Noted: No Respiratory Status: Nasal cannula History of Recent Intubation: No Behavior/Cognition: Alert;Cooperative;Distractible;Requires cueing Oral Cavity Assessment: Within Functional Limits Oral Care Completed by SLP: No Oral Cavity - Dentition: Edentulous Vision: Functional for self-feeding Self-Feeding Abilities: Able to feed self Patient Positioning: Upright in bed Baseline Vocal Quality: Other (comment)(rough)    Oral/Motor/Sensory Function Overall Oral Motor/Sensory Function: Within functional limits   Ice Chips Ice chips: Not tested   Thin Liquid Thin Liquid: Within functional limits Presentation: Self Fed;Straw  Nectar Thick Nectar Thick Liquid: Not tested   Honey Thick Honey Thick Liquid: Not tested   Puree Puree: Within functional limits Presentation:  Self Fed;Spoon   Solid     Solid: Impaired Presentation: Self Fed Oral Phase Impairments: Impaired mastication      Germain Osgood 10/05/2017,1:39 PM   Germain Osgood, M.A. CCC-SLP [phone]

## 2017-10-05 NOTE — Procedures (Signed)
Earth A. Merlene Laughter, MD     [email or web address]           HISTORY: This is a 77 year old female who presents with other mental status status post tPA.  The study is being done to evaluate for nonconvulsive seizures.  MEDICATIONS: Scheduled Meds: .  stroke: mapping our early stages of recovery book   Does not apply Once  . atorvastatin  80 mg Oral Daily  . budesonide (PULMICORT) nebulizer solution  0.5 mg Nebulization BID  . clopidogrel  75 mg Oral Daily  . diltiazem  120 mg Oral BID  . FLUoxetine  10 mg Oral Daily  . gabapentin  300 mg Oral QHS  . hydrALAZINE  25 mg Oral TID  . insulin aspart  0-20 Units Subcutaneous Q4H  . [START ON 10/06/2017] levETIRAcetam  1,000 mg Oral Daily  . loratadine  10 mg Oral Daily  . mouth rinse  15 mL Mouth Rinse BID  . methylPREDNISolone (SOLU-MEDROL) injection  40 mg Intravenous Q12H  . pantoprazole  40 mg Oral Daily  . tiotropium  18 mcg Inhalation Daily   Continuous Infusions: . sodium chloride 10 mL/hr at 10/05/17 1400  . cefTRIAXone (ROCEPHIN)  IV Stopped (10/05/17 1248)   PRN Meds:.sodium chloride, ALPRAZolam, ipratropium-albuterol, senna-docusate  Prior to Admission medications   Medication Sig Start Date End Date Taking? Authorizing Provider  acetaminophen (TYLENOL) 650 MG CR tablet Take 650-1,300 mg by mouth every 8 (eight) hours as needed for pain.   Yes [provider]  albuterol (ACCUNEB) 0.63 MG/3ML nebulizer solution Take 3 mLs by nebulization 3 (three) times daily as needed for wheezing.   Yes [provider]  albuterol (PROVENTIL HFA;VENTOLIN HFA) 108 (90 Base) MCG/ACT inhaler Inhale 2 puffs into the lungs every 4 (four) hours as needed for wheezing or shortness of breath.   Yes [provider]  ALPRAZolam Duanne Moron) 0.5 MG tablet Take 0.5 mg by mouth 2 (two) times daily as needed for anxiety.   Yes [provider]  atorvastatin (LIPITOR) 80 MG tablet Take 80 mg by mouth  daily.   Yes [provider]  cetirizine (ZYRTEC) 10 MG tablet Take 5 mg by mouth at bedtime.   Yes [provider]  cholecalciferol (VITAMIN D) 1000 units tablet Take 1,000 Units by mouth daily.   Yes [provider]  clopidogrel (PLAVIX) 75 MG tablet Take 75 mg by mouth daily.   Yes [provider]  diltiazem (CARDIZEM SR) 120 MG 12 hr capsule Take 120 mg by mouth 2 (two) times daily.   Yes [provider]  docusate sodium (COLACE) 100 MG capsule Take 100 mg by mouth daily.   Yes [provider]  FLUoxetine (PROZAC) 10 MG capsule Take 10 mg by mouth daily.   Yes [provider]  Fluticasone-Umeclidin-Vilant 100-62.5-25 MCG/INH AEPB Inhale 1 puff into the lungs daily.   Yes [provider]  gabapentin (NEURONTIN) 300 MG capsule Take 300 mg by mouth at bedtime.   Yes [provider]  hydrALAZINE (APRESOLINE) 25 MG tablet Take 25 mg by mouth 3 (three) times daily.   Yes [provider]  ipratropium (ATROVENT) 0.03 % nasal spray Place 2 sprays into both nostrils every 12 (twelve) hours.   Yes [provider]  ipratropium-albuterol (DUONEB) 0.5-2.5 (3) MG/3ML SOLN Take 3 mLs by nebulization every 6 (six) hours as needed (shortness of breath).   Yes [provider]  meclizine (ANTIVERT) 25 MG tablet Take 25 mg by  mouth 2 (two) times daily as needed for dizziness.   Yes [provider]  methocarbamol (ROBAXIN) 500 MG tablet Take 500 mg by mouth every 8 (eight) hours as needed for muscle spasms.   Yes [provider]  pantoprazole (PROTONIX) 40 MG tablet Take 40 mg by mouth daily.   Yes [provider]  tiotropium (SPIRIVA) 18 MCG inhalation capsule Place 18 mcg into inhaler and inhale daily.   Yes [provider]  promethazine (PHENERGAN) 12.5 MG tablet Take 12.5 mg by mouth every 6 (six) hours as needed for nausea or vomiting.    [provider]       ANALYSIS: A 16 channel recording using standard 10 20 measurements is conducted for 21 minutes.   There is a well-formed posterior dominant rhythm of 12 hertz which attenuates with eye-opening.  There is beta activity observed in the frontal areas.   Occasional frontal intermittent rhythmic delta activities are observed.  Awake and drowsy activities are noted.  Photic stimulation and hyperventilation are not conducted.  There is no focal or lateralized slowing.  There is no epileptiform activity is observed.  IMPRESSION: 1.   The recording shows occasional frontal intermittent rhythmic delta activity typically seen in toxic metabolic processes.  Otherwise, the study is a normal recording of awake and drowsy states.      Parveen Freehling A. Merlene Laughter, M.D.  Diplomate, Tax adviser of Psychiatry and Neurology ( Neurology).

## 2017-10-05 NOTE — Progress Notes (Signed)
Patient is alert and orientedX3. Time is incorrect for patient. Husband and sister in law at bedside. Treatment team made rounds this morning and patient has pending procedures for the day. Patient informed and verbalized understanding. Denies all pain. Will continue to monitor.

## 2017-10-05 NOTE — Progress Notes (Addendum)
STROKE TEAM PROGRESS NOTE  HPI:( Dr Cheral Marker ) Katherine [name] is a 77 y.o. female who was initially brought to [facility] for a COPD exacerbation.  Code stroke was called early in the morning secondary to a aphasia, she was last seen normal at 2:05 in the AM.,  Patient received a CT of the head at approximately 0 5:30 AM on 10/04/2017 which did not show any acute process. Tele-specialist was contacted at 5:38 AM.  tPA was ordered at 6:16 AM.  Prior to administering TPA blood pressure was 153/85.   TPA was administered at 06 42 and then patient was sent at 744 CTA of head and neck was obtained did not show any large vessel occlusion.  Tele-neurology felt that she could not exclude bilateral occipital or left MCA stroke with a patient with worsening a aphasia.  Patient then was transferred to [facility].  In route it was noted that patient was bleeding from her mouth, initially when she coughed he was copious amounts of blood however by the time that she was at [facility] the amount of blood had subsided however she did need to be suctioned multiple times.  There was question if she had some angioedema however per EMS there was also a note that said that she often times did have a large tongue and it was stick out of her mouth.  Due to question of possible aspiration PCCM was consulted.  However patient did have a good cough, in addition to the amount of blood was subsiding.  The decision was made not to intubate and to continue to watch the patient closely.  Patient was alert and oriented enough to suction herself if needed.  Although patient was very hard of hearing she was able to follow commands but did have some problems with repetition.  There was no a aphasia noted initially nor was there any decreased sensation. LKW: 2:05 AM on 10/04/2017 tpa given?:  Yes via tele-neurology at [place] regional Premorbid modified Rankin scale (mRS): 0 NIH stroke scale of 5 at [facility] stroke  scale of 8 while at [place] Her husband and sister-in-law are at the bedside.  They describe a recent episode at [facility].  They report prior stroke but no documentation of prior stroke.  Possible seizure?  Will complete work-up.  Transfer to the floor.  Vitals:   10/05/17 0700 10/05/17 0750 10/05/17 0759 10/05/17 0800  BP: (!) 154/93   (!) 161/97  Pulse:  83  73  Resp: (!) 29 15  16   Temp:    97.9 F (36.6 C)  TempSrc:    Oral  SpO2:  97% 99% 99%  Weight:      Height:        CBC:  Recent Labs  Lab 09/30/17 1235 10/03/17 0349  WBC 8.0 8.8  NEUTROABS 5.8  --   HGB 11.3* 10.4*  HCT 33.9* 32.0*  MCV 85.5 86.9  PLT 167 491    Basic Metabolic Panel:  Recent Labs  Lab 10/01/17 0500 10/01/17 1347 10/02/17 0509  NA 141 138 140  K 5.6* 5.1 5.1  CL 99 95* 101  CO2 31 29 31   GLUCOSE 315* 365* 317*  BUN 41* 42* 46*  CREATININE 2.07* 2.12* 1.87*  CALCIUM 8.4* 8.3* 8.3*  MG 1.8  --   --   PHOS 5.2*  --   --    Lipid Panel:     Component Value Date/Time  CHOL 183 10/05/2017 0631   CHOL 157 03/21/2017 0950   CHOL 148 06/15/2016 1046   TRIG 105 10/05/2017 0631   TRIG 109 06/15/2016 1046   HDL 89 10/05/2017 0631   HDL 42 03/21/2017 0950   CHOLHDL 2.1 10/05/2017 0631   VLDL 21 10/05/2017 0631   VLDL 22 06/15/2016 1046   LDLCALC 73 10/05/2017 0631   LDLCALC 85 03/21/2017 0950   HgbA1c:  Lab Results  Component Value Date   HGBA1C 7.7 (H) 10/05/2017   Urine Drug Screen:     Component Value Date/Time   LABOPIA NONE DETECTED 06/28/2017 2225   COCAINSCRNUR NONE DETECTED 06/28/2017 2225   LABBENZ NONE DETECTED 06/28/2017 2225   AMPHETMU NONE DETECTED 06/28/2017 2225   THCU NONE DETECTED 06/28/2017 2225   LABBARB NONE DETECTED 06/28/2017 2225    Alcohol Level     Component Value Date/Time   ETH <10 07/07/2017 1608    IMAGING Ct Angio Head W Or Wo Contrast  Result Date: 10/04/2017 CLINICAL DATA:  Speech disturbance  beginning earlier today. EXAM: CT ANGIOGRAPHY HEAD AND NECK TECHNIQUE: Multidetector CT imaging of the head and neck was performed using the standard protocol during bolus administration of intravenous contrast. Multiplanar CT image reconstructions and MIPs were obtained to evaluate the vascular anatomy. Carotid stenosis measurements (when applicable) are obtained utilizing NASCET criteria, using the distal internal carotid diameter as the denominator. CONTRAST:  61mL ISOVUE-370 IOPAMIDOL (ISOVUE-370) INJECTION 76% COMPARISON:  Head CT same day FINDINGS: CTA NECK FINDINGS Aortic arch: Aortic atherosclerosis. No dissection. No brachiocephalic vessel origin stenosis. Right carotid system: Common carotid artery is tortuous but widely patent to bifurcation region. There is calcified plaque at the carotid bifurcation and ICA bulb, but no stenosis. Cervical ICA is tortuous but widely patent. Left carotid system: Common carotid artery is tortuous but widely patent to the bifurcation. There is calcified plaque at the carotid bifurcation and ICA bulb but there is no stenosis. Cervical ICA is tortuous but widely patent. Vertebral arteries: Vertebral artery origins are patent. Vertebral arteries are tortuous proximally. Vessels are widely patent through the cervical region to the foramen magnum. Skeleton: Cervical spondylosis. Other neck: No mass or lymphadenopathy. Upper chest: Negative Review of the MIP images confirms the above findings CTA HEAD FINDINGS Anterior circulation: Both internal carotid arteries are patent through the skull base and siphon regions. Mild siphon atherosclerotic plaque but no stenosis. Anterior and middle cerebral vessels are patent without proximal stenosis, aneurysm or vascular malformation. No missing branch vessels are identified. Posterior circulation: Both vertebral arteries are patent through the foramen magnum to the basilar. No basilar stenosis. Posterior circulation branch vessels are.  Venous sinuses: Patent and normal. Anatomic variants: None significant. Delayed phase: No abnormal enhancement. Review of the MIP images confirms the above findings IMPRESSION: Aortic atherosclerosis. Brachiocephalic vessel tortuosity suggesting a history of hypertension. Atherosclerotic plaque at the carotid bifurcations and internal carotid artery bulbs but no stenosis. No intracranial stenosis or occlusion. Electronically Signed   By: Nelson Chimes M.D.   On: 10/04/2017 08:18   Ct Angio Neck W Or Wo Contrast  Result Date: 10/04/2017 CLINICAL DATA:  Speech disturbance beginning earlier today. EXAM: CT ANGIOGRAPHY HEAD AND NECK TECHNIQUE: Multidetector CT imaging of the head and neck was performed using the standard protocol during bolus administration of intravenous contrast. Multiplanar CT image reconstructions and MIPs were obtained to evaluate the vascular anatomy. Carotid stenosis measurements (when applicable) are obtained utilizing NASCET criteria, using the distal internal carotid diameter as  the denominator. CONTRAST:  32mL ISOVUE-370 IOPAMIDOL (ISOVUE-370) INJECTION 76% COMPARISON:  Head CT same day FINDINGS: CTA NECK FINDINGS Aortic arch: Aortic atherosclerosis. No dissection. No brachiocephalic vessel origin stenosis. Right carotid system: Common carotid artery is tortuous but widely patent to bifurcation region. There is calcified plaque at the carotid bifurcation and ICA bulb, but no stenosis. Cervical ICA is tortuous but widely patent. Left carotid system: Common carotid artery is tortuous but widely patent to the bifurcation. There is calcified plaque at the carotid bifurcation and ICA bulb but there is no stenosis. Cervical ICA is tortuous but widely patent. Vertebral arteries: Vertebral artery origins are patent. Vertebral arteries are tortuous proximally. Vessels are widely patent through the cervical region to the foramen magnum. Skeleton: Cervical spondylosis. Other neck: No mass or  lymphadenopathy. Upper chest: Negative Review of the MIP images confirms the above findings CTA HEAD FINDINGS Anterior circulation: Both internal carotid arteries are patent through the skull base and siphon regions. Mild siphon atherosclerotic plaque but no stenosis. Anterior and middle cerebral vessels are patent without proximal stenosis, aneurysm or vascular malformation. No missing branch vessels are identified. Posterior circulation: Both vertebral arteries are patent through the foramen magnum to the basilar. No basilar stenosis. Posterior circulation branch vessels are. Venous sinuses: Patent and normal. Anatomic variants: None significant. Delayed phase: No abnormal enhancement. Review of the MIP images confirms the above findings IMPRESSION: Aortic atherosclerosis. Brachiocephalic vessel tortuosity suggesting a history of hypertension. Atherosclerotic plaque at the carotid bifurcations and internal carotid artery bulbs but no stenosis. No intracranial stenosis or occlusion. Electronically Signed   By: Nelson Chimes M.D.   On: 10/04/2017 08:18   Mr Brain Wo Contrast  Result Date: 10/05/2017 CLINICAL DATA:  Aphasia. History of hypertension and intracranial aneurysm. EXAM: MRI HEAD WITHOUT CONTRAST MRA HEAD WITHOUT CONTRAST TECHNIQUE: Multiplanar, multiecho pulse sequences of the brain and surrounding structures were obtained without intravenous contrast. Angiographic images of the head were obtained using MRA technique without contrast. COMPARISON:  CT HEAD October 04, 2017 and MRI/MRA head Jun 28, 2017 FINDINGS: MRI HEAD FINDINGS-mild motion degraded examination. INTRACRANIAL CONTENTS: No reduced diffusion to suggest acute ischemia. No susceptibility artifact to suggest hemorrhage. The ventricles and sulci are normal for patient's age. Scattered supratentorial patchy pontine white matter FLAIR T2 hyperintensities. Old RIGHT basal ganglia infarct. No suspicious parenchymal signal, masses, mass effect. No  abnormal extra-axial fluid collections. No extra-axial masses. VASCULAR: Normal major intracranial vascular flow voids present at skull base. SKULL AND UPPER CERVICAL SPINE: No abnormal sellar expansion. Potentially dehiscent sellar floor. No suspicious calvarial bone marrow signal. Craniocervical junction maintained. SINUSES/ORBITS: Paranasal sinus mucosal thickening with sphenoid sinus air-fluid level. Bilateral mastoid effusions.The included ocular globes and orbital contents are non-suspicious. OTHER: Patient is edentulous. MRA HEAD FINDINGS ANTERIOR CIRCULATION: Normal flow related enhancement of the included cervical, petrous, cavernous and supraclinoid internal carotid arteries. Mild stenosis LEFT paraclinoid ICA. Patent anterior communicating artery. 4 mm superiorly directed A-comm aneurysm. Patent anterior and middle cerebral arteries, mild luminal irregularity compatible with atherosclerosis. No large vessel occlusion, flow limiting stenosis. POSTERIOR CIRCULATION: Codominant vertebral arteries. Vertebrobasilar arteries are patent, with normal flow related enhancement of the main branch vessels. Patent posterior cerebral arteries, mild luminal irregularity compatible with atherosclerosis. No large vessel occlusion, flow limiting stenosis,  aneurysm. ANATOMIC VARIANTS: Supernumerary anterior cerebral artery. Source images and MIP images were reviewed. IMPRESSION: MRI HEAD: 1. No acute intracranial process. 2. Mild-to-moderate chronic small vessel ischemic changes. Old RIGHT basal ganglia lacunar infarct. 3. Worsening  paranasal sinusitis and mastoid effusions. MRA HEAD: 1. No emergent large vessel occlusion or flow-limiting stenosis. 2. Mild intracranial atherosclerosis. 3. 4 mm A-comm aneurysm. Neuro-Interventional Radiology consultation is suggested to evaluate the appropriateness of potential treatment. Non-emergent evaluation can be arranged by calling [phone] during usual hours. Electronically  Signed   By: Elon Alas M.D.   On: 10/05/2017 05:22   Dg Chest Port 1 View  Result Date: 10/05/2017 CLINICAL DATA:  Pneumonia. EXAM: PORTABLE CHEST 1 VIEW COMPARISON:  09/30/2017.  09/19/2017. FINDINGS: Cardiomegaly. No pulmonary venous congestion. Mild basilar atelectasis, improved from prior exams. No pleural effusion or pneumothorax. IMPRESSION: 1.  Cardiomegaly.  No pulmonary venous congestion. 2.  Mild basilar atelectasis, improved from prior exams. Electronically Signed   By: Marcello Moores  Register   On: 10/05/2017 07:02   Mr Jodene Nam Head Wo Contrast  Result Date: 10/05/2017 CLINICAL DATA:  Aphasia. History of hypertension and intracranial aneurysm. EXAM: MRI HEAD WITHOUT CONTRAST MRA HEAD WITHOUT CONTRAST TECHNIQUE: Multiplanar, multiecho pulse sequences of the brain and surrounding structures were obtained without intravenous contrast. Angiographic images of the head were obtained using MRA technique without contrast. COMPARISON:  CT HEAD October 04, 2017 and MRI/MRA head Jun 28, 2017 FINDINGS: MRI HEAD FINDINGS-mild motion degraded examination. INTRACRANIAL CONTENTS: No reduced diffusion to suggest acute ischemia. No susceptibility artifact to suggest hemorrhage. The ventricles and sulci are normal for patient's age. Scattered supratentorial patchy pontine white matter FLAIR T2 hyperintensities. Old RIGHT basal ganglia infarct. No suspicious parenchymal signal, masses, mass effect. No abnormal extra-axial fluid collections. No extra-axial masses. VASCULAR: Normal major intracranial vascular flow voids present at skull base. SKULL AND UPPER CERVICAL SPINE: No abnormal sellar expansion. Potentially dehiscent sellar floor. No suspicious calvarial bone marrow signal. Craniocervical junction maintained. SINUSES/ORBITS: Paranasal sinus mucosal thickening with sphenoid sinus air-fluid level. Bilateral mastoid effusions.The included ocular globes and orbital contents are non-suspicious. OTHER: Patient is  edentulous. MRA HEAD FINDINGS ANTERIOR CIRCULATION: Normal flow related enhancement of the included cervical, petrous, cavernous and supraclinoid internal carotid arteries. Mild stenosis LEFT paraclinoid ICA. Patent anterior communicating artery. 4 mm superiorly directed A-comm aneurysm. Patent anterior and middle cerebral arteries, mild luminal irregularity compatible with atherosclerosis. No large vessel occlusion, flow limiting stenosis. POSTERIOR CIRCULATION: Codominant vertebral arteries. Vertebrobasilar arteries are patent, with normal flow related enhancement of the main branch vessels. Patent posterior cerebral arteries, mild luminal irregularity compatible with atherosclerosis. No large vessel occlusion, flow limiting stenosis,  aneurysm. ANATOMIC VARIANTS: Supernumerary anterior cerebral artery. Source images and MIP images were reviewed. IMPRESSION: MRI HEAD: 1. No acute intracranial process. 2. Mild-to-moderate chronic small vessel ischemic changes. Old RIGHT basal ganglia lacunar infarct. 3. Worsening paranasal sinusitis and mastoid effusions. MRA HEAD: 1. No emergent large vessel occlusion or flow-limiting stenosis. 2. Mild intracranial atherosclerosis. 3. 4 mm A-comm aneurysm. Neuro-Interventional Radiology consultation is suggested to evaluate the appropriateness of potential treatment. Non-emergent evaluation can be arranged by calling [phone] during usual hours. Electronically Signed   By: Elon Alas M.D.   On: 10/05/2017 05:22   Ct Head Code Stroke Wo Contrast`  Result Date: 10/04/2017 CLINICAL DATA:  Code stroke. Stroke-like symptoms. Speech difficulties. History of hypertension. EXAM: CT HEAD WITHOUT CONTRAST TECHNIQUE: Contiguous axial images were obtained from the base of the skull through the vertex without intravenous contrast. COMPARISON:  MRI head Jun 28, 2017 FINDINGS: BRAIN: No intraparenchymal hemorrhage, mass effect nor midline shift. The ventricles and sulci are normal  for age. Patchy supratentorial white matter hypodensities within normal range for patient's  age, though non-specific are most compatible with chronic small vessel ischemic disease. Old RIGHT basal ganglia lacunar infarct. No acute large vascular territory infarcts. No abnormal extra-axial fluid collections. Basal cisterns are patent. VASCULAR: Mild calcific atherosclerosis of the carotid siphons. SKULL: No skull fracture. No significant scalp soft tissue swelling. SINUSES/ORBITS: Moderate paranasal sinus mucosal thickening. Bilateral mastoid effusions. Included ocular globes and orbital contents are non-suspicious. OTHER: None. ASPECTS [facility] Stroke Program Early CT Score) - Ganglionic level infarction (caudate, lentiform nuclei, internal capsule, insula, M1-M3 cortex): 7 - Supraganglionic infarction (M4-M6 cortex): 3 Total score (0-10 with 10 being normal): 10 IMPRESSION: 1. No acute intracranial process. 2. Mild-to-moderate chronic small vessel ischemic changes. Old lacunar infarct. 3. ASPECTS is 10. 4. Dr. Marcille Blanco paged at time of study interpretation, awaiting return call. Electronically Signed   By: Elon Alas M.D.   On: 10/04/2017 05:47    PHYSICAL EXAM Pleasant obese elderly lady currently not in distress. . Afebrile. Head is nontraumatic. Neck is supple without bruit.    Cardiac exam no murmur or gallop. Lungs are clear to auscultation. Distal pulses are well felt.lingual swelling, angioedema appears to have resolved. Neurological Exam :  Awake alert oriented 3. Extremely hard of hearing. Speech is slightly dysarthric and hypophonic but can be understood. No aphasia. Extraocular moments are full range without nystagmus. Mild exotropia of the right eye which was at baseline.. Blinks to  threat bilaterally.face is symmetric without weakness. Tongue midline motor system exam symmetric upper   extremity strength without drift or focal weakness.symmetric lower extremity strength with 3/5 proximal  and good strength distally. Deep tendon reflexes symmetric. Sensation is intact. Plantars are downgoing. Gait not tested. ASSESSMENT/PLAN [name] is a 77 y.o. female with history of CKD T, HTN, LVH, tobacco use and recent COPD admission discharged 8/22, presenting to [facility] with expressive aphasia.  Received IV TPA 10/04/2017 at 0642.  Transfer to call for further evaluation and treatment.  Stroke-like episode status post IV TPA Possible seizure   Speech difficulty episodes x2 -1 now and one in May.  Both with negative MRIs.  Code Stroke CT head No acute stroke. Small vessel disease.  Old lacunar infarct.  ASPECTS 10.     CTA head & neck aortic atherosclerosis.  Brachiocephalic vessel tortuosity suggesting HTN.  Atherosclerosis carotid bifurcations and ICA bulbs.  MRI no acute stroke.  Small vessel disease.  Old right basal ganglia lacune.  Worsening paranasal sinusitis and mastoid effusions  MRA no LVO.  Mild atherosclerosis.  4 mm ACom aneurysm  2D Echo  pending   Check EEG  LDL 73  HgbA1c 7.7  SCDs for VTE prophylaxis  NPO. ST to assess. anticipate she will pass.   Start Keppra 1 g IV now followed by 1000 mg Keppra ER daily  clopidogrel 75 mg daily prior to admission, now on No antithrombotic.  Continue Plavix 75 mg now and at discharge.  Therapy recommendations: SNF  Disposition: Pending  Hemoptysis/epistaxis following TPA treatment, resolved  Bleeding from nasal and oral cavity during transport to [facility].  Concern for airway cough.  CCM consulted  No need for intubation.  Good cough unable to clear airway independently  Hypertension  Blood pressure as high as 185/131   Treated with nicardipine, now off  Blood pressure now stable  . BP goal normotensive  Hyperlipidemia  Home meds:  lipitor 80, resumed in hospital  LDL 73, goal < 70  Continue statin at discharge  Diabetes type II  HgbA1c 7.7,  goal < 7.0  Uncontrolled  Other Stroke Risk  Factors  Advanced age  Former cigarette smoker  Morbid obesity, Body mass index is 50.73 kg/m., recommend weight loss, diet and exercise as appropriate   Hx stroke/TIA  06/2017 - hx stroke like episode 4 mos ago with problems with her speech seen at [facility]. MRI neg  For stroke. Cannot rule out TIA.   Other Active Problems  4 mm ACom aneurysm -follow-up as an outpatient  Respiratory failure, AE COPD.  On bronchial dilators. On IV Solu-Medrol with taper.  Wean O2. (d/c from hospital 10/04/2017 for COPD)  CKD stage III (baseline CR 1.7-2.12)  Mild anemia, 10.4  Hospital day # 1  Burnetta Sabin, MSN, APRN, ANVP-BC, AGPCNP-BC Advanced Practice Stroke Nurse [place] for Schedule & Pager information 10/05/2017 2:11 PM  I have personally examined this patient, reviewed notes, independently viewed imaging studies, participated in medical decision making and plan of care.ROS completed by me personally and pertinent positives fully documented  I have made any additions or clarifications directly to the above note. Agree with note above.  She has presented with recurrent stereotypical episodes with negative brain imaging twice for acute stroke. Possibilities include Advil migraine versus seizure. Recommend trial of Keppra thousand milligrams daily. Continue close neurological monitoring and blood pressure control as per post TPA protocol. Transfer to neurology floor bed later. Long discussion of the bedside with the patient and family and answered questions.This patient is critically ill and at significant risk of neurological worsening, death and care requires constant monitoring of vital signs, hemodynamics,respiratory and cardiac monitoring, extensive review of multiple databases, frequent neurological assessment, discussion with family, other specialists and medical decision making of high complexity.I have made any additions or clarifications directly to the above note.This  critical care time does not reflect procedure time, or teaching time or supervisory time of PA/NP/Med Resident etc but could involve care discussion time.  I spent 30 minutes of neurocritical care time  in the care of  this patient.      Antony Contras, MD Medical Director [facility] Pager: [phone] 10/05/2017 5:07 PM  To contact Stroke Continuity provider, please refer to [email or web address] After hours, contact General Neurology

## 2017-10-05 NOTE — Progress Notes (Signed)
EEG complete - results pending 

## 2017-10-05 NOTE — Evaluation (Signed)
Speech Language Pathology Evaluation Patient Details Name: Joniece Smotherman MRN: [id number] DOB: 16-Jun-1940 Today's Date: 10/05/2017 Time: 0347-4259 SLP Time Calculation (min) (ACUTE ONLY): 14 min  Problem List:  Patient Active Problem List   Diagnosis Date Noted  . Stroke (cerebrum) ([place]) 10/04/2017  . Palliative care by specialist   . CAP (community acquired pneumonia) 09/11/2017  . Elevated rheumatoid factor 09/05/2017  . COPD exacerbation ([place]) 08/27/2017  . Frequent hospital admissions 08/22/2017  . Aphasia 06/28/2017  . Acute on chronic respiratory failure with hypoxia ([place]) 06/12/2017  . Hand pain 03/21/2017  . Dry skin 03/21/2017  . Pain in finger of left hand 09/12/2016  . Chronic fatigue 06/12/2016  . Left knee pain 06/12/2016  . Goals of care, counseling/discussion 03/13/2016  . Primary localized osteoarthritis of right knee 02/08/2016  . OSA and COPD overlap syndrome ([place]) 09/16/2015  . Rotator cuff syndrome 09/07/2015  . 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 Sleep disturbance 04/14/2015  . Coronary artery disease 03/14/2015  . 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 Anxiety state   . Tobacco abuse   . Prurigo nodularis   . GERD (gastroesophageal reflux disease)   . COPD (chronic obstructive pulmonary disease) ([place])   . Osteoarthritis   . Vitamin D deficiency disease   . Chronic constipation   . CKD (chronic kidney disease), stage III ([place])   . LVH (left ventricular hypertrophy) 05/20/2013  . Essential hypertension 05/20/2013  . Morbid obesity ([place]) 05/20/2013   Past Medical History:  Past Medical History:  Diagnosis Date  . Angioedema   . Anxiety   . Anxiety and depression   . Chronic constipation   . Chronic kidney disease    stage 3  . COPD (chronic obstructive pulmonary disease) ([place])   . Gallstones   . GERD (gastroesophageal reflux disease)   . Hypertension   . Left ventricular hypertrophy   . Osteoarthritis   .  Prurigo nodularis   . Tobacco abuse   . Vitamin D deficiency disease    Past Surgical History:  Past Surgical History:  Procedure Laterality Date  . CHOLECYSTECTOMY    . 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   Procedure: TOTAL KNEE ARTHROPLASTY;  Surgeon: Hessie Knows, MD;  Location: [facility] ORS;  Service: Orthopedics;  Laterality: Right;   HPI:  Pt is a 77 year old female admitted with acute on chronic respiratory failure from COPD exacerbation. She developed acute onset of aphasia on 8/22, s/p tPA with resultant epistaxis/hemoptysis. MRI showed no acute changes. PMH: COPD, chronic renal disease, hypertension, GERD, anxiety, HTN   Assessment / Plan / Recommendation Clinical Impression  Pt presents with reduced sustained attention, basic to mildly complex comprehension, confrontational and divergent naming, and intellectual awareness. However, she and her family report improvement from previous date. They report that she is at her baseline level of function. MRI is also negative, and her husband says that he manages all household responsibilities. Therefore, additional acute SLP f/u is not indicated. Will sign off.    SLP Assessment  SLP Recommendation/Assessment: Patient does not need any further Speech Lanaguage Pathology Services SLP Visit Diagnosis: Cognitive communication deficit (R41.841)    Follow Up Recommendations  24 hour supervision/assistance    Frequency and Duration           SLP Evaluation Cognition  Overall Cognitive Status: History of cognitive impairments - at baseline       Comprehension  Auditory Comprehension  Overall Auditory Comprehension: Impaired at baseline    Expression Expression Primary Mode of Expression: Verbal Verbal Expression Overall Verbal Expression: Impaired at baseline Written Expression Dominant Hand: Right   Oral / Motor  Oral Motor/Sensory Function Overall Oral Motor/Sensory Function: Within  functional limits Motor Speech Overall Motor Speech: Appears within functional limits for tasks assessed   GO                    Germain Osgood 10/05/2017, 1:42 PM  Germain Osgood, M.A. CCC-SLP [phone]

## 2017-10-05 NOTE — Plan of Care (Signed)
Problem: Education: Goal: Knowledge of disease or condition will improve Outcome: Progressing Goal: Knowledge of secondary prevention will improve Outcome: Progressing Goal: Knowledge of patient specific risk factors addressed and post discharge goals established will improve Outcome: Progressing   Problem: Coping: Goal: Will verbalize positive feelings about self Outcome: Progressing Goal: Will identify appropriate support needs Outcome: Progressing   Problem: Health Behavior/Discharge Planning: Goal: Ability to manage health-related needs will improve Outcome: Progressing   Problem: Self-Care: Goal: Ability to participate in self-care as condition permits will improve Outcome: Progressing Goal: Verbalization of feelings and concerns over difficulty with self-care will improve Outcome: Progressing   Problem: Nutrition: Goal: Risk of aspiration will decrease Outcome: Progressing Goal: Dietary intake will improve Outcome: Progressing   Problem: Ischemic Stroke/TIA Tissue Perfusion: Goal: Complications of ischemic stroke/TIA will be minimized Outcome: Progressing   Problem: Education: Goal: Knowledge of General Education information will improve Description Including pain rating scale, medication(s)/side effects and non-pharmacologic comfort measures Outcome: Progressing   Problem: Health Behavior/Discharge Planning: Goal: Ability to manage health-related needs will improve Outcome: Progressing   Problem: Clinical Measurements: Goal: Ability to maintain clinical measurements within normal limits will improve Outcome: Progressing Goal: Will remain free from infection Outcome: Progressing Goal: Diagnostic test results will improve Outcome: Progressing Goal: Respiratory complications will improve Outcome: Progressing Goal: Cardiovascular complication will be avoided Outcome: Progressing   Problem: Activity: Goal: Risk for activity intolerance will  decrease Outcome: Progressing   Problem: Nutrition: Goal: Adequate nutrition will be maintained Outcome: Progressing   Problem: Coping: Goal: Level of anxiety will decrease Outcome: Progressing   Problem: Elimination: Goal: Will not experience complications related to bowel motility Outcome: Progressing Goal: Will not experience complications related to urinary retention Outcome: Progressing   Problem: Pain Managment: Goal: General experience of comfort will improve Outcome: Progressing   Problem: Safety: Goal: Ability to remain free from injury will improve Outcome: Progressing   Problem: Skin Integrity: Goal: Risk for impaired skin integrity will decrease Outcome: Progressing   Problem: Education: Goal: Knowledge of disease or condition will improve 10/05/2017 1106 by Jeanne Ivan, RN Outcome: Progressing 10/05/2017 1106 by Jeanne Ivan, RN Outcome: Progressing Goal: Knowledge of secondary prevention will improve 10/05/2017 1106 by Jeanne Ivan, RN Outcome: Progressing 10/05/2017 1106 by Jeanne Ivan, RN Outcome: Progressing Goal: Knowledge of patient specific risk factors addressed and post discharge goals established will improve 10/05/2017 1106 by Jeanne Ivan, RN Outcome: Progressing 10/05/2017 1106 by Jeanne Ivan, RN Outcome: Progressing   Problem: Coping: Goal: Will verbalize positive feelings about self 10/05/2017 1106 by Jeanne Ivan, RN Outcome: Progressing 10/05/2017 1106 by Jeanne Ivan, RN Outcome: Progressing Goal: Will identify appropriate support needs 10/05/2017 1106 by Jeanne Ivan, RN Outcome: Progressing 10/05/2017 1106 by Jeanne Ivan, RN Outcome: Progressing   Problem: Health Behavior/Discharge Planning: Goal: Ability to manage health-related needs will improve 10/05/2017 1106 by Jeanne Ivan, RN Outcome: Progressing 10/05/2017 1106 by Jeanne Ivan, RN Outcome: Progressing   Problem: Self-Care: Goal:  Ability to participate in self-care as condition permits will improve 10/05/2017 1106 by Jeanne Ivan, RN Outcome: Progressing 10/05/2017 1106 by Jeanne Ivan, RN Outcome: Progressing Goal: Verbalization of feelings and concerns over difficulty with self-care will improve 10/05/2017 1106 by Jeanne Ivan, RN Outcome: Progressing 10/05/2017 1106 by Jeanne Ivan, RN Outcome: Progressing   Problem: Nutrition: Goal: Risk of aspiration will decrease 10/05/2017 1106 by Jeanne Ivan, RN Outcome: Progressing 10/05/2017 1106 by Gerre Pebbles,  Nyoka Cowden, RN Outcome: Progressing Goal: Dietary intake will improve 10/05/2017 1106 by Jeanne Ivan, RN Outcome: Progressing 10/05/2017 1106 by Jeanne Ivan, RN Outcome: Progressing   Problem: Ischemic Stroke/TIA Tissue Perfusion: Goal: Complications of ischemic stroke/TIA will be minimized 10/05/2017 1106 by Jeanne Ivan, RN Outcome: Progressing 10/05/2017 1106 by Jeanne Ivan, RN Outcome: Progressing   Problem: Education: Goal: Knowledge of General Education information will improve Description Including pain rating scale, medication(s)/side effects and non-pharmacologic comfort measures 10/05/2017 1106 by Jeanne Ivan, RN Outcome: Progressing 10/05/2017 1106 by Jeanne Ivan, RN Outcome: Progressing   Problem: Health Behavior/Discharge Planning: Goal: Ability to manage health-related needs will improve 10/05/2017 1106 by Jeanne Ivan, RN Outcome: Progressing 10/05/2017 1106 by Jeanne Ivan, RN Outcome: Progressing   Problem: Clinical Measurements: Goal: Ability to maintain clinical measurements within normal limits will improve 10/05/2017 1106 by Jeanne Ivan, RN Outcome: Progressing 10/05/2017 1106 by Jeanne Ivan, RN Outcome: Progressing Goal: Will remain free from infection 10/05/2017 1106 by Jeanne Ivan, RN Outcome: Progressing 10/05/2017 1106 by Jeanne Ivan, RN Outcome: Progressing Goal: Diagnostic  test results will improve 10/05/2017 1106 by Jeanne Ivan, RN Outcome: Progressing 10/05/2017 1106 by Jeanne Ivan, RN Outcome: Progressing Goal: Respiratory complications will improve 10/05/2017 1106 by Jeanne Ivan, RN Outcome: Progressing 10/05/2017 1106 by Jeanne Ivan, RN Outcome: Progressing Goal: Cardiovascular complication will be avoided 10/05/2017 1106 by Jeanne Ivan, RN Outcome: Progressing 10/05/2017 1106 by Jeanne Ivan, RN Outcome: Progressing   Problem: Activity: Goal: Risk for activity intolerance will decrease 10/05/2017 1106 by Jeanne Ivan, RN Outcome: Progressing 10/05/2017 1106 by Jeanne Ivan, RN Outcome: Progressing   Problem: Nutrition: Goal: Adequate nutrition will be maintained 10/05/2017 1106 by Jeanne Ivan, RN Outcome: Progressing 10/05/2017 1106 by Jeanne Ivan, RN Outcome: Progressing   Problem: Coping: Goal: Level of anxiety will decrease 10/05/2017 1106 by Jeanne Ivan, RN Outcome: Progressing 10/05/2017 1106 by Jeanne Ivan, RN Outcome: Progressing   Problem: Elimination: Goal: Will not experience complications related to bowel motility 10/05/2017 1106 by Jeanne Ivan, RN Outcome: Progressing 10/05/2017 1106 by Jeanne Ivan, RN Outcome: Progressing Goal: Will not experience complications related to urinary retention 10/05/2017 1106 by Jeanne Ivan, RN Outcome: Progressing 10/05/2017 1106 by Jeanne Ivan, RN Outcome: Progressing   Problem: Pain Managment: Goal: General experience of comfort will improve 10/05/2017 1106 by Jeanne Ivan, RN Outcome: Progressing 10/05/2017 1106 by Jeanne Ivan, RN Outcome: Progressing   Problem: Safety: Goal: Ability to remain free from injury will improve 10/05/2017 1106 by Jeanne Ivan, RN Outcome: Progressing 10/05/2017 1106 by Jeanne Ivan, RN Outcome: Progressing   Problem: Skin Integrity: Goal: Risk for impaired skin integrity will  decrease 10/05/2017 1106 by Jeanne Ivan, RN Outcome: Progressing 10/05/2017 1106 by Jeanne Ivan, RN Outcome: Progressing

## 2017-10-06 DIAGNOSIS — E875 Hyperkalemia: Secondary | ICD-10-CM

## 2017-10-06 DIAGNOSIS — I671 Cerebral aneurysm, nonruptured: Secondary | ICD-10-CM

## 2017-10-06 DIAGNOSIS — F17200 Nicotine dependence, unspecified, uncomplicated: Secondary | ICD-10-CM

## 2017-10-06 DIAGNOSIS — N183 Chronic kidney disease, stage 3 (moderate): Secondary | ICD-10-CM

## 2017-10-06 DIAGNOSIS — R569 Unspecified convulsions: Secondary | ICD-10-CM

## 2017-10-06 LAB — GLUCOSE, CAPILLARY
GLUCOSE-CAPILLARY: 285 mg/dL — AB (ref 70–99)
GLUCOSE-CAPILLARY: 332 mg/dL — AB (ref 70–99)
Glucose-Capillary: 182 mg/dL — ABNORMAL HIGH (ref 70–99)
Glucose-Capillary: 160 mg/dL — ABNORMAL HIGH (ref 70–99)
Glucose-Capillary: 194 mg/dL — ABNORMAL HIGH (ref 70–99)
Glucose-Capillary: 290 mg/dL — ABNORMAL HIGH (ref 70–99)

## 2017-10-06 LAB — BASIC METABOLIC PANEL
Anion gap: 8 (ref 5–15)
BUN: 58 mg/dL — ABNORMAL HIGH (ref 8–23)
CHLORIDE: 105 mmol/L (ref 98–111)
CO2: 28 mmol/L (ref 22–32)
Calcium: 9.1 mg/dL (ref 8.9–10.3)
Creatinine, Ser: 1.87 mg/dL — ABNORMAL HIGH (ref 0.44–1.00)
GFR calc non Af Amer: 25 mL/min — ABNORMAL LOW (ref >60)
GFR, EST AFRICAN AMERICAN: 29 mL/min — AB (ref >60)
Glucose, Bld: 201 mg/dL — ABNORMAL HIGH (ref 70–99)
POTASSIUM: 5.7 mmol/L — AB (ref 3.5–5.1)
SODIUM: 141 mmol/L (ref 135–145)

## 2017-10-06 LAB — CBC
HEMATOCRIT: 34.6 % — AB (ref 36.0–46.0)
HEMOGLOBIN: 10.5 g/dL — AB (ref 12.0–15.0)
MCH: 26.9 pg (ref 26.0–34.0)
MCHC: 30.3 g/dL (ref 30.0–36.0)
MCV: 88.7 fL (ref 78.0–100.0)
Platelets: 206 10*3/uL (ref 150–400)
RBC: 3.9 MIL/uL (ref 3.87–5.11)
RDW: 17.8 % — ABNORMAL HIGH (ref 11.5–15.5)
WBC: 5.6 10*3/uL (ref 4.0–10.5)

## 2017-10-06 LAB — BLOOD GAS, ARTERIAL
Acid-Base Excess: 3.9 mmol/L — ABNORMAL HIGH (ref 0.0–2.0)
Bicarbonate: 31.1 mmol/L — ABNORMAL HIGH (ref 20.0–28.0)
FIO2: 0.32
O2 Saturation: 87.6 %
PATIENT TEMPERATURE: 37
PO2 ART: 58 mmHg — AB (ref 83.0–108.0)
pCO2 arterial: 59 mmHg — ABNORMAL HIGH (ref 32.0–48.0)
pH, Arterial: 7.33 — ABNORMAL LOW (ref 7.350–7.450)

## 2017-10-06 LAB — POTASSIUM: POTASSIUM: 5.8 mmol/L — AB (ref 3.5–5.1)

## 2017-10-06 MED ORDER — SODIUM POLYSTYRENE SULFONATE 15 GM/60ML PO SUSP
30.0000 g | Freq: Once | ORAL | Status: AC
  Administered 2017-10-06: 30 g via ORAL
  Filled 2017-10-06: qty 120

## 2017-10-06 MED ORDER — SODIUM POLYSTYRENE SULFONATE 15 GM/60ML PO SUSP
30.0000 g | Freq: Two times a day (BID) | ORAL | Status: AC
  Administered 2017-10-06 – 2017-10-07 (×2): 30 g via ORAL
  Filled 2017-10-06 (×2): qty 120

## 2017-10-06 MED ORDER — BISACODYL 10 MG RE SUPP
10.0000 mg | Freq: Once | RECTAL | Status: DC
  Filled 2017-10-06: qty 1

## 2017-10-06 NOTE — Progress Notes (Signed)
STROKE TEAM PROGRESS NOTE   INTERVAL HISTORY Her granddaughter is at the bedside.  Patient lying in bed without distress.  Symptoms resolved.  MRI no acute infarct.  She refused SNF placement but okay with home health.  Vitals:   10/06/17 0401 10/06/17 0802 10/06/17 0852 10/06/17 1129  BP: (!) 142/75 (!) 147/83  (!) 107/96  Pulse: 66 66  76  Resp: 16 18  16   Temp: 98.1 F (36.7 C) 98.4 F (36.9 C)  98.2 F (36.8 C)  TempSrc: Oral Oral  Oral  SpO2: 100% 93% 96% 97%  Weight:      Height:        CBC:  Recent Labs  Lab 09/30/17 1235 10/03/17 0349 10/06/17 0345  WBC 8.0 8.8 5.6  NEUTROABS 5.8  --   --   HGB 11.3* 10.4* 10.5*  HCT 33.9* 32.0* 34.6*  MCV 85.5 86.9 88.7  PLT 167 187 417    Basic Metabolic Panel:  Recent Labs  Lab 10/01/17 0500  10/02/17 0509 10/06/17 0345  NA 141   < > 140 141  K 5.6*   < > 5.1 5.7*  CL 99   < > 101 105  CO2 31   < > 31 28  GLUCOSE 315*   < > 317* 201*  BUN 41*   < > 46* 58*  CREATININE 2.07*   < > 1.87* 1.87*  CALCIUM 8.4*   < > 8.3* 9.1  MG 1.8  --   --   --   PHOS 5.2*  --   --   --    < > = values in this interval not displayed.   Lipid Panel:     Component Value Date/Time   CHOL 183 10/05/2017 0631   CHOL 157 03/21/2017 0950   CHOL 148 06/15/2016 1046   TRIG 105 10/05/2017 0631   TRIG 109 06/15/2016 1046   HDL 89 10/05/2017 0631   HDL 42 03/21/2017 0950   CHOLHDL 2.1 10/05/2017 0631   VLDL 21 10/05/2017 0631   VLDL 22 06/15/2016 1046   LDLCALC 73 10/05/2017 0631   LDLCALC 85 03/21/2017 0950   HgbA1c:  Lab Results  Component Value Date   HGBA1C 7.7 (H) 10/05/2017   Urine Drug Screen:     Component Value Date/Time   LABOPIA NONE DETECTED 06/28/2017 2225   COCAINSCRNUR NONE DETECTED 06/28/2017 2225   LABBENZ NONE DETECTED 06/28/2017 2225   AMPHETMU NONE DETECTED 06/28/2017 2225   THCU NONE DETECTED 06/28/2017 2225   LABBARB NONE DETECTED 06/28/2017 2225    Alcohol Level     Component Value Date/Time   ETH <10 07/07/2017 1608    IMAGING  Mr Jodene Nam Head Wo Contrast 10/05/2017 IMPRESSION:   MRI HEAD:  1. No acute intracranial process.  2. Mild-to-moderate chronic small vessel ischemic changes.   Old RIGHT basal ganglia lacunar infarct.  3. Worsening paranasal sinusitis and mastoid effusions.   MRA HEAD:  1. No emergent large vessel occlusion or flow-limiting stenosis.  2. Mild intracranial atherosclerosis.  3. 4 mm A-comm aneurysm. Neuro-Interventional Radiology consultation is suggested to evaluate the appropriateness of potential treatment.    Dg Chest Port 1 View 10/05/2017 IMPRESSION:  1.  Cardiomegaly.  No pulmonary venous congestion.  2.  Mild basilar atelectasis, improved from prior exams.    Transthoracic Echocardiogram  10/05/2017 Left ventricle:  The cavity size was normal. Systolic function was normal. The estimated ejection fraction was in the range of 60% to 65%. Wall  motion was normal; there were no regional wall motion abnormalities. There was an increased relative contribution of atrial contraction to ventricular filling, which may be seen with aging. The deceleration time of the early transmitral flow velocity was normal. The pulmonary vein flow pattern was normal. The tissue Doppler parameters were normal. Left ventricular diastolic function parameters were normal.   EEG 10/05/2017 IMPRESSION: The recording shows occasional frontal intermittent rhythmic delta activity typically seen in toxic metabolic processes.  Otherwise, the study is a normal recording of awake and drowsy states.    PHYSICAL EXAM Pleasant obese elderly lady currently not in distress. . Afebrile. Head is nontraumatic. Neck is supple without bruit.    Cardiac exam no murmur or gallop. Lungs are clear to auscultation. Distal pulses are well felt.lingual swelling, angioedema appears to have resolved. Neurological Exam :  Awake alert oriented 3. Extremely hard of hearing. Poor denture,  speech is slightly dysarthric and hypophonic but can be understood. No aphasia. Extraocular moments are full range without nystagmus. Mild exotropia of the right eye which was at baseline.. Blinks to  threat bilaterally. face is symmetric without weakness. Tongue midline motor system exam symmetric upper   extremity strength without drift or focal weakness.symmetric lower extremity strength with 3/5 proximal and good strength distally. Deep tendon reflexes symmetric. Sensation is intact. Plantars are downgoing. Gait not tested.   ASSESSMENT/PLAN [name] is a 77 y.o. female with history of CKD T, HTN, LVH, tobacco use and recent COPD admission discharged 8/22, presenting to [facility] with expressive aphasia.  Received IV TPA 10/04/2017 at 0642.  Transfer to call for further evaluation and treatment.  Stroke-like episode s/p IV TPA - concerning for seizure   Speech difficulty episodes x2 -1 now and one in May.  Both with negative MRIs.  CT head No acute stroke. Small vessel disease.  Old lacunar infarct.    CTA head & neck aortic atherosclerosis. Atherosclerosis carotid bifurcations  MRI no acute stroke.  Small vessel disease.  Old right basal ganglia lacune.  Worsening paranasal sinusitis and mastoid effusions  MRA no LVO.  Mild atherosclerosis.  4 mm ACom aneurysm  2D Echo  - EF 60 - 65%. No cardiac source of emboli identified.   EEG -occasional FIRDA  LDL 73  HgbA1c 7.7  SCDs for VTE prophylaxis  Start Keppra 1 g IV now followed by 1000 mg Keppra ER daily  clopidogrel 75 mg daily prior to admission, now on Plavix 75 mg now and at discharge.  Therapy recommendations: SNF, but patient refused  Disposition: Pending  Hx of similar episode  06/2017 - hx stroke like episode 4 mos ago with difficulty speaking seen at [facility]. MRI neg for stroke. Cannot rule out TIA, concerning for seizure.  Now on Keppra  Hemoptysis/epistaxis following TPA treatment, resolved  Bleeding from  nasal and oral cavity during transport to [facility].    CCM consulted  No need for intubation.  No resolved  Resolving pneumonitis, treated with Rocephin for 3 days  Hypertension  Blood pressure as high as 185/131   Treated with nicardipine, now off  Blood pressure now stable  . BP goal normotensive  Hyperlipidemia  Home meds:  lipitor 80, resumed in hospital  LDL 73, goal < 70  Continue statin at discharge  Diabetes type II  HgbA1c 7.7, goal < 7.0  Uncontrolled  SSI  CBG monitoring  CKD stage III  baseline Cre (1.7-2.12)    Cre 2.12-> 1.7  Hyperkalemia - K 5.7 ->received  30 gms kayexalate  repeat K pending today  BMP monitoring in am  4 mm ACOM aneurysm  Showing on CTA and MRA head  No intervention needed at this time  follow-up as an outpatient  Other Stroke Risk Factors  Advanced age  Former cigarette smoker  Morbid obesity, Body mass index is 50.73 kg/m., recommend weight loss, diet and exercise as appropriate   Other Active Problems  Respiratory failure, AE COPD.  On bronchial dilators. On IV Solu-Medrol with taper.    Wean O2. (d/c from hospital 10/04/2017 for COPD)  Mild anemia of chronic disease, hemoglobin 10.4   Hospital day # 2  Rosalin Hawking, MD PhD Stroke Neurology 10/06/2017 7:07 PM    To contact Stroke Continuity provider, please refer to [email or web address] After hours, contact General Neurology

## 2017-10-06 NOTE — Clinical Social Work Note (Signed)
Clinical Social Work Assessment  Patient Details  Name: Katherine [name] MRN: [id number] Date of Birth: [birth year]  Date of referral:  10/06/17               Reason for consult:  Facility Placement                Permission sought to share information with:  Family Supports Permission granted to share information::  Yes, Verbal Permission Granted  Name::        Agency::     Relationship::     Contact Information:     Housing/Transportation Living arrangements for the past 2 months:  Single Family Home Source of Information:  Patient Patient Interpreter Needed:  None Criminal Activity/Legal Involvement Pertinent to Current Situation/Hospitalization:  No - Comment as needed Significant Relationships:  Spouse Lives with:  Spouse Do you feel safe going back to the place where you live?  Yes Need for family participation in patient care:  Yes (Comment)  Care giving concerns:  Pt is alert to time, place, and situation.  Social Worker assessment / plan:  CSW spoke with pt at bedside. Pt states she is not going to rehab and that her husband can care for her at home. RNCM notified.   Employment status:  Retired Nurse, adult PT Recommendations:  [place] / Referral to community resources:  [place]  Patient/Family's Response to care:  Pt declines at questions regarding pt care at this time.  Patient/Family's Understanding of and Emotional Response to Diagnosis, Current Treatment, and Prognosis:  Pt is understanding of recommendation of SNF. However, pt is refusing SNF at this time.   Clinical Social Worker will sign off for now as social work intervention is no longer needed. Please consult [place] again if new need arises.   Shelton Silvas A Marybel Alcott 10/06/2017   Emotional Assessment Appearance:  Appears stated age Attitude/Demeanor/Rapport:  (Patient was appropriate) Affect (typically observed):  Accepting, Appropriate,  Calm Orientation:  Oriented to Place, Oriented to  Time, Oriented to Situation Alcohol / Substance use:  Not Applicable Psych involvement (Current and /or in the community):  No (Comment)  Discharge Needs  Concerns to be addressed:  Patient refuses services, Basic Needs, Care Coordination Readmission within the last 30 days:  Yes Current discharge risk:  Dependent with Mobility Barriers to Discharge:  Continued Medical Work up   [employer], LCSW 10/06/2017, 10:41 AM

## 2017-10-06 NOTE — Clinical Social Work Note (Signed)
CSW spoke with pt at bedside. Pt is refusing SNF at this time. Pt states her husband is able to care for her at home. RNCM notified. Clinical Social Worker will sign off for now as social work intervention is no longer needed. Please consult [place] again if new need arises.   Katherine [name] [name] 10/06/2017

## 2017-10-07 DIAGNOSIS — E1159 Type 2 diabetes mellitus with other circulatory complications: Secondary | ICD-10-CM

## 2017-10-07 DIAGNOSIS — E1169 Type 2 diabetes mellitus with other specified complication: Secondary | ICD-10-CM

## 2017-10-07 DIAGNOSIS — I10 Essential (primary) hypertension: Secondary | ICD-10-CM

## 2017-10-07 DIAGNOSIS — R739 Hyperglycemia, unspecified: Secondary | ICD-10-CM

## 2017-10-07 DIAGNOSIS — E785 Hyperlipidemia, unspecified: Secondary | ICD-10-CM

## 2017-10-07 LAB — CBC
HCT: 36.7 % (ref 36.0–46.0)
Hemoglobin: 11.2 g/dL — ABNORMAL LOW (ref 12.0–15.0)
MCH: 27.5 pg (ref 26.0–34.0)
MCHC: 30.5 g/dL (ref 30.0–36.0)
MCV: 90.2 fL (ref 78.0–100.0)
PLATELETS: 205 10*3/uL (ref 150–400)
RBC: 4.07 MIL/uL (ref 3.87–5.11)
RDW: 18 % — AB (ref 11.5–15.5)
WBC: 5.6 10*3/uL (ref 4.0–10.5)

## 2017-10-07 LAB — GLUCOSE, CAPILLARY
GLUCOSE-CAPILLARY: 188 mg/dL — AB (ref 70–99)
GLUCOSE-CAPILLARY: 201 mg/dL — AB (ref 70–99)
GLUCOSE-CAPILLARY: 247 mg/dL — AB (ref 70–99)
Glucose-Capillary: 224 mg/dL — ABNORMAL HIGH (ref 70–99)
Glucose-Capillary: 199 mg/dL — ABNORMAL HIGH (ref 70–99)
Glucose-Capillary: 293 mg/dL — ABNORMAL HIGH (ref 70–99)
Glucose-Capillary: 373 mg/dL — ABNORMAL HIGH (ref 70–99)
Glucose-Capillary: >600 mg/dL (ref 70–99)
Glucose-Capillary: 231 mg/dL — ABNORMAL HIGH (ref 70–99)

## 2017-10-07 LAB — BASIC METABOLIC PANEL
Anion gap: 11 (ref 5–15)
BUN: 49 mg/dL — ABNORMAL HIGH (ref 8–23)
CALCIUM: 8.6 mg/dL — AB (ref 8.9–10.3)
CO2: 29 mmol/L (ref 22–32)
Chloride: 104 mmol/L (ref 98–111)
Creatinine, Ser: 1.89 mg/dL — ABNORMAL HIGH (ref 0.44–1.00)
GFR calc non Af Amer: 25 mL/min — ABNORMAL LOW (ref >60)
GFR, EST AFRICAN AMERICAN: 29 mL/min — AB (ref >60)
Glucose, Bld: 225 mg/dL — ABNORMAL HIGH (ref 70–99)
Potassium: 4.9 mmol/L (ref 3.5–5.1)
SODIUM: 144 mmol/L (ref 135–145)

## 2017-10-07 MED ORDER — PREDNISONE 10 MG (21) PO TBPK: ORAL_TABLET | ORAL | 0 refills | Status: DC

## 2017-10-07 MED ORDER — INSULIN ASPART 100 UNIT/ML ~~LOC~~ SOLN: 0.0000 [IU] | Freq: Three times a day (TID) | SUBCUTANEOUS | Status: DC

## 2017-10-07 MED ORDER — INSULIN ASPART 100 UNIT/ML ~~LOC~~ SOLN
0.0000 [IU] | Freq: Three times a day (TID) | SUBCUTANEOUS | Status: DC
  Administered 2017-10-07: 4 [IU] via SUBCUTANEOUS
  Administered 2017-10-07 – 2017-10-08 (×2): 20 [IU] via SUBCUTANEOUS
  Administered 2017-10-08: 4 [IU] via SUBCUTANEOUS
  Administered 2017-10-08: 7 [IU] via SUBCUTANEOUS
  Administered 2017-10-08: 11 [IU] via SUBCUTANEOUS
  Administered 2017-10-09: 7 [IU] via SUBCUTANEOUS
  Administered 2017-10-09: 11 [IU] via SUBCUTANEOUS

## 2017-10-07 MED ORDER — LEVETIRACETAM ER 500 MG PO TB24: 1000.0000 mg | ORAL_TABLET | Freq: Every day | ORAL | 11 refills | Status: DC

## 2017-10-07 NOTE — Plan of Care (Signed)
Patient stable, discussed POC with patient and spouse, agreeable with plan SNF after D/C, denies question/concerns at this time.

## 2017-10-07 NOTE — Discharge Instructions (Signed)
1. Be sure to see your primary care provider within 1 week. Bmet blood test next visit. 2. Dr Arlean Hopping office will call you for an appointment to evaluate aortic aneurysm. 3. The Neurology office will call you for a follow up appointment. 4. A nurse and therapists will come to your home for further care and assistance. 5. Avoid high potassium foods. 6. Drink plenty of water.  7. Increase activity gradually as tolerated.

## 2017-10-07 NOTE — Care Management (Signed)
Attempted to discuss [facility] resources with patient and husband.  Pt too sleepy and unable to stay awake to discuss.  Left message for husband to return call when able. Staff RN aware and will call me if family arrives or patient wakes up enough to discuss d/c plan.  RN states patient has been too sleepy to discuss plans today.    1630- After checking in room several times through the afternoon, husband is present.  He states that he cannot care for patient at home and he and her sons want her to go to [facility].  Pt states she doesn't want to go to SNF but she will if her family wants her to.  She is unhappy with decision, but willing to go.  Shelton Silvas, [place] notified and will resume process to be continued tomorrow.

## 2017-10-07 NOTE — Progress Notes (Signed)
STROKE TEAM PROGRESS NOTE   INTERVAL HISTORY No family is at the bedside.  Patient sitting in bed for lunch.  Patient wants to go home and declined SNF.  However, after she discussed with family, she finally agreed with SNF placement.  Vitals:   10/07/17 0844 10/07/17 0845 10/07/17 1137 10/07/17 1541  BP:   (!) 158/92 139/82  Pulse:   87 93  Resp:   18 (!) 24  Temp:   98.6 F (37 C) 98.7 F (37.1 C)  TempSrc:   Oral Oral  SpO2: 97% 97% 97% 99%  Weight:      Height:        CBC:  Recent Labs  Lab 10/06/17 0345 10/07/17 0552  WBC 5.6 5.6  HGB 10.5* 11.2*  HCT 34.6* 36.7  MCV 88.7 90.2  PLT 206 412    Basic Metabolic Panel:  Recent Labs  Lab 10/01/17 0500  10/06/17 0345 10/06/17 1628 10/07/17 0552  NA 141   < > 141  --  144  K 5.6*   < > 5.7* 5.8* 4.9  CL 99   < > 105  --  104  CO2 31   < > 28  --  29  GLUCOSE 315*   < > 201*  --  225*  BUN 41*   < > 58*  --  49*  CREATININE 2.07*   < > 1.87*  --  1.89*  CALCIUM 8.4*   < > 9.1  --  8.6*  MG 1.8  --   --   --   --   PHOS 5.2*  --   --   --   --    < > = values in this interval not displayed.   Lipid Panel:     Component Value Date/Time   CHOL 183 10/05/2017 0631   CHOL 157 03/21/2017 0950   CHOL 148 06/15/2016 1046   TRIG 105 10/05/2017 0631   TRIG 109 06/15/2016 1046   HDL 89 10/05/2017 0631   HDL 42 03/21/2017 0950   CHOLHDL 2.1 10/05/2017 0631   VLDL 21 10/05/2017 0631   VLDL 22 06/15/2016 1046   LDLCALC 73 10/05/2017 0631   LDLCALC 85 03/21/2017 0950   HgbA1c:  Lab Results  Component Value Date   HGBA1C 7.7 (H) 10/05/2017   Urine Drug Screen:     Component Value Date/Time   LABOPIA NONE DETECTED 06/28/2017 2225   COCAINSCRNUR NONE DETECTED 06/28/2017 2225   LABBENZ NONE DETECTED 06/28/2017 2225   AMPHETMU NONE DETECTED 06/28/2017 2225   THCU NONE DETECTED 06/28/2017 2225   LABBARB NONE DETECTED 06/28/2017 2225    Alcohol Level     Component Value Date/Time   ETH <10 07/07/2017 1608     IMAGING  Mr Jodene Nam Head Wo Contrast 10/05/2017 IMPRESSION:   MRI HEAD:  1. No acute intracranial process.  2. Mild-to-moderate chronic small vessel ischemic changes.   Old RIGHT basal ganglia lacunar infarct.  3. Worsening paranasal sinusitis and mastoid effusions.   MRA HEAD:  1. No emergent large vessel occlusion or flow-limiting stenosis.  2. Mild intracranial atherosclerosis.  3. 4 mm A-comm aneurysm. Neuro-Interventional Radiology consultation is suggested to evaluate the appropriateness of potential treatment.    Dg Chest Port 1 View 10/05/2017 IMPRESSION:  1.  Cardiomegaly.  No pulmonary venous congestion.  2.  Mild basilar atelectasis, improved from prior exams.    Transthoracic Echocardiogram  10/05/2017 Left ventricle:  The cavity size was normal. Systolic function  was normal. The estimated ejection fraction was in the range of 60% to 65%. Wall motion was normal; there were no regional wall motion abnormalities. There was an increased relative contribution of atrial contraction to ventricular filling, which may be seen with aging. The deceleration time of the early transmitral flow velocity was normal. The pulmonary vein flow pattern was normal. The tissue Doppler parameters were normal. Left ventricular diastolic function parameters were normal.   EEG 10/05/2017 IMPRESSION: The recording shows occasional frontal intermittent rhythmic delta activity typically seen in toxic metabolic processes.  Otherwise, the study is a normal recording of awake and drowsy states.    PHYSICAL EXAM Pleasant morbidly obese elderly lady currently not in distress. . Afebrile. Head is nontraumatic. Neck is supple without bruit.    Cardiac exam no murmur or gallop. Lungs are clear to auscultation. Distal pulses are well felt.lingual swelling, angioedema appears to have resolved. Neurological Exam :  Awake alert oriented 3. Extremely hard of hearing. Poor denture, speech is slightly  dysarthric and hypophonic but can be understood. No aphasia. Extraocular moments are full range without nystagmus. Mild exotropia of the right eye which was at baseline.. Blinks to  threat bilaterally. face is symmetric without weakness. Tongue midline motor system exam symmetric upper   extremity strength without drift or focal weakness.symmetric lower extremity strength with 3/5 proximal and good strength distally. Deep tendon reflexes symmetric. Sensation is intact. Plantars are downgoing. Gait not tested.   ASSESSMENT/PLAN Katherine [name] is a 77 y.o. female with history of CKD T, HTN, LVH, tobacco use and recent COPD admission discharged 8/22, presenting to [facility] with expressive aphasia.  Received IV TPA 10/04/2017 at 0642.  Transfer to call for further evaluation and treatment.  Stroke-like episode s/p IV TPA - concerning for seizure   Speech difficulty episodes x2 -1 now and one in May.  Both with negative MRIs.  CT head No acute stroke. Small vessel disease.  Old lacunar infarct.    CTA head & neck aortic atherosclerosis. Atherosclerosis carotid bifurcations  MRI no acute stroke.  Small vessel disease.  Old right basal ganglia lacune.  Worsening paranasal sinusitis and mastoid effusions  MRA no LVO.  Mild atherosclerosis.  4 mm ACom aneurysm - F/U Dr Estanislado Pandy  2D Echo  - EF 60 - 65%. No cardiac source of emboli identified.   EEG -occasional FIRDA  LDL 73  HgbA1c 7.7  SCDs for VTE prophylaxis  Start Keppra 1 g IV now followed by 1000 mg Keppra ER daily  clopidogrel 75 mg daily prior to admission, now on Plavix 75 mg, continue at discharge.  Therapy recommendations: SNF - pt initially declined SNF ; however, after talking it over with family she has now agreed. Bed should be available Monday.  Disposition: Pending  Hx of similar episode  06/2017 - hx stroke like episode 4 mos ago with difficulty speaking seen at [facility]. MRI neg for stroke. Cannot rule out TIA, concerning  for seizure.  Now on Keppra  Hemoptysis/epistaxis following TPA treatment, resolved  Bleeding from nasal and oral cavity during transport to [facility].    CCM consulted  No need for intubation.  No resolved  Resolving pneumonitis, treated with Rocephin for 3 days  Hypertension  Blood pressure as high as 185/131   Treated with nicardipine, now off  Blood pressure now stable  . BP goal normotensive  Hyperlipidemia  Home meds:  lipitor 80, resumed in hospital  LDL 73, goal < 70  Continue statin at  discharge  Diabetes type II  HgbA1c 7.7, goal < 7.0  Uncontrolled  Hyperglycemia  SSI  CBG monitoring  DM coordinator consultation placed  CKD stage III  baseline Cre (1.7-2.12)    Cre 2.12-> 1.87-> 1.89  Hyperkalemia - K 5.7 ->5.8 -> 4.9  Treated with Kayexalate  BMP monitoring in am  4 mm ACOM aneurysm  Showing on CTA and MRA head  No intervention needed at this time  follow-up as an outpatient - Dr Estanislado Pandy  Other Stroke Risk Factors  Advanced age  Former cigarette smoker  Morbid obesity, Body mass index is 50.73 kg/m., recommend weight loss, diet and exercise as appropriate   Other Active Problems  Respiratory failure, AE COPD.  On bronchial dilators. On IV Solu-Medrol with taper.    Wean O2. (d/c from hospital 10/04/2017 for COPD)  Mild anemia of chronic disease, hemoglobin 10.4   Hospital day # 3  Katherine Hawking, MD PhD Stroke Neurology 10/07/2017 5:55 PM     To contact Stroke Continuity provider, please refer to [email or web address] After hours, contact General Neurology

## 2017-10-07 NOTE — Clinical Social Work Note (Signed)
CSW got a call from [facility]. Now, pt has decided to pursue SNF and family and pt has decided on [facility]. Referral has been sent. Pt has [employer] and will require a authorization. SNF workup has not been completed given the fact that pt refused SNF originally. Workup will be started. Insurance Josem Kaufmann can be started tomorrow (8/26). MD notified.   Loletha Grayer, MSW [phone]

## 2017-10-07 NOTE — Discharge Summary (Addendum)
Stroke Discharge Summary  Patient ID: Katherine [name]   MRN: [id number]      DOB: [birth year]  Date of Admission: 10/04/2017 Date of Discharge: 10/10/2017  Attending Physician:  Rosalin Hawking, MD, Stroke MD Consultant(s):   Critical Care Consult - possible aspiration pneumonia, Palliatve Medicine Team, Diabetes Coordinator Patient's PCP:  Valerie Roys, DO  DISCHARGE DIAGNOSIS:   Principal Problem:   Stroke-like episode ([place]) s/p IV tpa Active Problems:   Essential hypertension   COPD (chronic obstructive pulmonary disease) (HCC)   OSA and COPD overlap syndrome ([place])   Possible Seizures ([place])   Hyperlipemia   Aneurysm of anterior Com cerebral artery   Past Medical History:  Diagnosis Date  . Angioedema   . Anxiety   . Anxiety and depression   . Chronic constipation   . Chronic kidney disease    stage 3  . COPD (chronic obstructive pulmonary disease) ([place])   . Gallstones   . GERD (gastroesophageal reflux disease)   . Hypertension   . Left ventricular hypertrophy   . Osteoarthritis   . Prurigo nodularis   . Tobacco abuse   . Vitamin D deficiency disease    Past Surgical History:  Procedure Laterality Date  . CHOLECYSTECTOMY    . 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   Procedure: TOTAL KNEE ARTHROPLASTY;  Surgeon: Hessie Knows, MD;  Location: [facility] ORS;  Service: Orthopedics;  Laterality: Right;    Allergies as of 10/10/2017      Reactions   Bee Venom Swelling   Enalapril Maleate Swelling      Medication List    TAKE these medications   acetaminophen 650 MG CR tablet Commonly known as:  TYLENOL Take 650-1,300 mg by mouth every 8 (eight) hours as needed for pain.   albuterol 108 (90 Base) MCG/ACT inhaler Commonly known as:  PROVENTIL HFA;VENTOLIN HFA Inhale 2 puffs into the lungs every 4 (four) hours as needed for wheezing or shortness of breath.   albuterol 0.63 MG/3ML nebulizer solution Commonly known as:   ACCUNEB Take 3 mLs by nebulization 3 (three) times daily as needed for wheezing.   ALPRAZolam 0.5 MG tablet Commonly known as:  XANAX Take 0.5 mg by mouth 2 (two) times daily as needed for anxiety.   atorvastatin 80 MG tablet Commonly known as:  LIPITOR Take 80 mg by mouth daily.   cetirizine 10 MG tablet Commonly known as:  ZYRTEC Take 5 mg by mouth at bedtime.   cholecalciferol 1000 units tablet Commonly known as:  VITAMIN D Take 1,000 Units by mouth daily.   clopidogrel 75 MG tablet Commonly known as:  PLAVIX Take 75 mg by mouth daily.   diltiazem 120 MG 12 hr capsule Commonly known as:  CARDIZEM SR Take 120 mg by mouth 2 (two) times daily.   docusate sodium 100 MG capsule Commonly known as:  COLACE Take 100 mg by mouth daily.   FLUoxetine 10 MG capsule Commonly known as:  PROZAC Take 10 mg by mouth daily.   Fluticasone-Umeclidin-Vilant 100-62.5-25 MCG/INH Aepb Inhale 1 puff into the lungs daily.   gabapentin 300 MG capsule Commonly known as:  NEURONTIN Take 300 mg by mouth at bedtime.   hydrALAZINE 25 MG tablet Commonly known as:  APRESOLINE Take 25 mg by mouth 3 (three) times daily.   ipratropium 0.03 % nasal spray Commonly known as:  ATROVENT Place 2 sprays into both nostrils every 12 (twelve) hours.  ipratropium-albuterol 0.5-2.5 (3) MG/3ML Soln Commonly known as:  DUONEB Take 3 mLs by nebulization every 6 (six) hours as needed (shortness of breath).   levETIRAcetam 500 MG 24 hr tablet Commonly known as:  KEPPRA XR Take 2 tablets (1,000 mg total) by mouth daily.   linagliptin 5 MG Tabs tablet Commonly known as:  TRADJENTA Take 1 tablet (5 mg total) by mouth daily.   meclizine 25 MG tablet Commonly known as:  ANTIVERT Take 25 mg by mouth 2 (two) times daily as needed for dizziness.   methocarbamol 500 MG tablet Commonly known as:  ROBAXIN Take 500 mg by mouth every 8 (eight) hours as needed for muscle spasms.   pantoprazole 40 MG  tablet Commonly known as:  PROTONIX Take 40 mg by mouth daily.   predniSONE 10 MG tablet Commonly known as:  DELTASONE Take 4 tablets (40 mg total) by mouth daily with breakfast. Decrease by 10 mg every third day. First 40 mg dose given in hospital 10/08/2017. Next dose due 10/09/2017   predniSONE 10 MG tablet Commonly known as:  DELTASONE Take 3 tablets (30 mg total) by mouth daily with breakfast. Start taking on:  10/11/2017   predniSONE 20 MG tablet Commonly known as:  DELTASONE Take 1 tablet (20 mg total) by mouth daily with breakfast. Start taking on:  10/14/2017   predniSONE 10 MG tablet Commonly known as:  DELTASONE Take 1 tablet (10 mg total) by mouth daily with breakfast. Start taking on:  10/17/2017   promethazine 12.5 MG tablet Commonly known as:  PHENERGAN Take 12.5 mg by mouth every 6 (six) hours as needed for nausea or vomiting.   tiotropium 18 MCG inhalation capsule Commonly known as:  SPIRIVA Place 18 mcg into inhaler and inhale daily.       LABORATORY STUDIES CBC    Component Value Date/Time   WBC 7.7 10/09/2017 0434   RBC 4.19 10/09/2017 0434   HGB 11.3 (L) 10/09/2017 0434   HGB 12.6 03/27/2017 0948   HCT 36.9 10/09/2017 0434   HCT 37.9 03/27/2017 0948   PLT 198 10/09/2017 0434   PLT 252 03/27/2017 0948   MCV 88.1 10/09/2017 0434   MCV 95 03/27/2017 0948   MCV 94 02/20/2013 1147   MCH 27.0 10/09/2017 0434   MCHC 30.6 10/09/2017 0434   RDW 17.7 (H) 10/09/2017 0434   RDW 16.2 (H) 03/27/2017 0948   RDW 14.5 02/20/2013 1147   LYMPHSABS 1.6 09/30/2017 1235   LYMPHSABS 2.6 03/27/2017 0948   LYMPHSABS 0.8 (L) 01/23/2013 0422   MONOABS 0.5 09/30/2017 1235   MONOABS 0.1 (L) 01/23/2013 0422   EOSABS 0.0 09/30/2017 1235   EOSABS 0.1 03/21/2017 0950   EOSABS 0.0 01/23/2013 0422   BASOSABS 0.0 09/30/2017 1235   BASOSABS 0.0 03/21/2017 0950   BASOSABS 0.0 01/23/2013 0422   CMP    Component Value Date/Time   NA 141 10/09/2017 0434   NA 142 03/21/2017  0950   NA 139 02/20/2013 1147   K 3.9 10/09/2017 0434   K 4.1 02/20/2013 1147   CL 102 10/09/2017 0434   CL 111 (H) 02/20/2013 1147   CO2 28 10/09/2017 0434   CO2 23 02/20/2013 1147   GLUCOSE 99 10/09/2017 0434   GLUCOSE 115 (H) 02/20/2013 1147   BUN 42 (H) 10/09/2017 0434   BUN 19 03/21/2017 0950   BUN 17 02/20/2013 1147   CREATININE 1.49 (H) 10/09/2017 0434   CREATININE 1.58 (H) 02/20/2013 1147   CALCIUM 8.7 (  L) 10/09/2017 0434   CALCIUM 9.0 02/20/2013 1147   PROT 6.4 (L) 09/30/2017 1235   PROT 6.7 03/21/2017 0950   PROT 7.5 01/22/2013 0917   ALBUMIN 3.2 (L) 09/30/2017 1235   ALBUMIN 4.1 03/21/2017 0950   ALBUMIN 3.5 01/22/2013 0917   AST 17 09/30/2017 1235   AST 22 01/22/2013 0917   ALT 22 09/30/2017 1235   ALT 15 01/22/2013 0917   ALKPHOS 67 09/30/2017 1235   ALKPHOS 92 01/22/2013 0917   BILITOT 0.8 09/30/2017 1235   BILITOT 0.2 03/21/2017 0950   BILITOT 0.4 01/22/2013 0917   GFRNONAA 33 (L) 10/09/2017 0434   GFRNONAA 33 (L) 02/20/2013 1147   GFRAA 38 (L) 10/09/2017 0434   GFRAA 38 (L) 02/20/2013 1147   COAGS Lab Results  Component Value Date   INR 0.92 08/28/2017   INR 0.84 07/07/2017   INR 0.90 02/02/2016   Lipid Panel    Component Value Date/Time   CHOL 183 10/05/2017 0631   CHOL 157 03/21/2017 0950   CHOL 148 06/15/2016 1046   TRIG 105 10/05/2017 0631   TRIG 109 06/15/2016 1046   HDL 89 10/05/2017 0631   HDL 42 03/21/2017 0950   CHOLHDL 2.1 10/05/2017 0631   VLDL 21 10/05/2017 0631   VLDL 22 06/15/2016 1046   LDLCALC 73 10/05/2017 0631   LDLCALC 85 03/21/2017 0950   HgbA1C  Lab Results  Component Value Date   HGBA1C 7.7 (H) 10/05/2017   Urinalysis    Component Value Date/Time   COLORURINE YELLOW (A) 07/07/2017 1845   APPEARANCEUR CLEAR (A) 07/07/2017 1845   APPEARANCEUR Clear 02/20/2013 1459   LABSPEC 1.011 07/07/2017 1845   LABSPEC 1.012 02/20/2013 1459   PHURINE 5.0 07/07/2017 1845   GLUCOSEU NEGATIVE 07/07/2017 1845   GLUCOSEU  Negative 02/20/2013 1459   HGBUR NEGATIVE 07/07/2017 1845   BILIRUBINUR NEGATIVE 07/07/2017 1845   BILIRUBINUR Negative 02/20/2013 1459   KETONESUR NEGATIVE 07/07/2017 1845   PROTEINUR NEGATIVE 07/07/2017 1845   NITRITE NEGATIVE 07/07/2017 1845   LEUKOCYTESUR NEGATIVE 07/07/2017 1845   LEUKOCYTESUR Negative 02/20/2013 1459   Urine Drug Screen     Component Value Date/Time   LABOPIA NONE DETECTED 06/28/2017 2225   COCAINSCRNUR NONE DETECTED 06/28/2017 2225   LABBENZ NONE DETECTED 06/28/2017 2225   AMPHETMU NONE DETECTED 06/28/2017 2225   THCU NONE DETECTED 06/28/2017 2225   LABBARB NONE DETECTED 06/28/2017 2225    Alcohol Level    Component Value Date/Time   ETH <10 07/07/2017 1608    SIGNIFICANT DIAGNOSTIC STUDIES Ct Head Code Stroke Wo Contrast 10/04/2017 1. No acute intracranial process. 2. Mild-to-moderate chronic small vessel ischemic changes. Old lacunar infarct. 3. ASPECTS is 10.    Ct Angio Head W Or Wo Contrast Ct Angio Neck W Or Wo Contrast 10/04/2017 Aortic atherosclerosis. Brachiocephalic vessel tortuosity suggesting a history of hypertension. Atherosclerotic plaque at the carotid bifurcations and internal carotid artery bulbs but no stenosis. No intracranial stenosis or occlusion.   MRI HEAD:  10/05/2017 1. No acute intracranial process.  2. Mild-to-moderate chronic small vessel ischemic changes. Old RIGHT basal ganglia lacunar infarct.  3. Worsening paranasal sinusitis and mastoid effusions.   MRA HEAD:  10/05/2017 1. No emergent large vessel occlusion or flow-limiting stenosis.  2. Mild intracranial atherosclerosis.  3. 4 mm A-comm aneurysm. Neuro-Interventional Radiology consultation is suggested to evaluate the appropriateness of potential treatment.   Dg Chest Port 1 View 10/05/2017 IMPRESSION:  1.  Cardiomegaly.  No pulmonary venous congestion.  2.  Mild basilar atelectasis, improved from prior exams.   Ct Chest High Resolution 10/09/2017 1. No  evidence of interstitial lung disease. 2. Mild centrilobular emphysema with mild diffuse bronchial wall thickening, suggesting COPD. 3. Scattered mildly thickened parenchymal bands in the mid to lower lungs bilaterally, most prominent in the medial lower lobes, mildly increased from prior chest CT, compatible with nonspecific postinfectious/postinflammatory scarring versus subsegmental atelectasis. Stable dilated main pulmonary artery, suggesting chronic pulmonary arterial hypertension. 4. Three-vessel coronary atherosclerosis. Aortic Atherosclerosis   Transthoracic Echocardiogram  10/05/2017 Left ventricle: The cavity size was normal. Systolic function was normal. The estimated ejection fraction was in the range of 60% to 65%. Wall motion was normal; there were no regional wall motion abnormalities. There was an increased relative contribution of atrial contraction to ventricular filling, which may be seen with aging. The deceleration time of the early transmitral flow velocity was normal. The pulmonary vein flow pattern was normal. The tissue Doppler parameters were normal. Left ventricular diastolic function parameters were normal.  EEG 10/05/2017 The recording shows occasional frontal intermittent rhythmic delta activity typically seen in toxic metabolic processes. Otherwise, the study is a normal recording of awake and drowsy states.     HISTORY OF PRESENT ILLNESS Katherine [name] is a 77 y.o. female who was initially brought to [facility] for a COPD exacerbation.  Code stroke was called early in the morning secondary to aphasia, she was last seen normal 10/04/2017 at 2:05 am.  Patient received a CT of the head at 5:30 AM on 10/04/2017 which did not show any acute process. Tele-specialist was contacted at 5:38 AM.  tPA was ordered at 6:16 AM.  Prior to administering TPA blood pressure was 153/85. TPA was administered at 06 42 and then patient was sent at 744. CTA of head and neck was  obtained did not show any large vessel occlusion. Tele-neurology felt that she could not exclude bilateral occipital or left MCA stroke with a patient with worsening aphasia.   Patient then was transferred to [facility].  In route it was noted that patient was bleeding from her mouth, initially when she coughed copious amounts of blood however by the time that she was at [facility] the amount of blood had subsided however she did need to be suctioned multiple times.  There was question if she had some angioedema however per EMS there was also a note that said that she often times did have a large tongue and it was stick out of her mouth.  Due to question of possible aspiration PCCM was consulted. However patient did have a good cough, in addition to the amount of blood was subsiding.  The decision was made not to intubate and to continue to watch the patient closely.  Patient was alert and oriented enough to suction herself if needed.  Although patient was very hard of hearing she was able to follow commands but did have some problems with repetition.  There was no aphasia noted initially nor was there any decreased sensation. Premorbid modified Rankin scale (mRS): 0. NIH stroke scale of 5 at [facility] stroke scale of 8 while at [place] Ms. Mckenzey Parcell is a 77 y.o. female with history of CKD, HTN, LVH, tobacco use and recent COPD admission discharged 8/22, presenting to [facility] with expressive aphasia.  Received IV TPA 10/04/2017 at 0642.  Transferred to [facility] for further evaluation and treatment.  Stroke-like episode s/p IV TPA - concerning for  seizure   Speech difficulty episodes x2 -1 now and one in May.  Both with negative MRIs.  CT head No acute stroke. Small vessel disease.  Old lacunar infarct.    CTA head & neck aortic atherosclerosis. Atherosclerosis carotid bifurcations  MRI no acute stroke.  Small vessel disease.  Old right basal ganglia  lacune.  Worsening paranasal sinusitis and mastoid effusions  MRA no LVO.  Mild atherosclerosis.  4 mm ACom aneurysm  2D Echo  - EF 60 - 65%. No cardiac source of emboli identified.   EEG -occasional FIRDA  LDL 73  HgbA1c 7.7  Started on Keppra 1 g IV  followed by 1000 mg Keppra ER daily  clopidogrel 75 mg daily prior to admission, now on Plavix 75 mg and continue at discharge.  No further expressive aphasia in hospital   Therapy recommendations: SNF - pt initially declined SNF ; however, after talking it over with family she has now agreed.    Disposition: SNF  Hx of similar episode (expressive aphasia)  06/2017 - hx stroke like episode 4 mos ago with difficulty speaking seen at [facility]. MRI neg for stroke. Cannot rule out TIA, concerning for seizure.  Now on Keppra  Hemoptysis/epistaxis following TPA treatment, resolved  Bleeding from nasal and oral cavity during transport to [facility].    CCM consulted  No need for intubation.  Now resolved  Resolving pneumonitis, treated with Rocephin for 3 days  Hypertension  Blood pressure as high as 185/131   Treated with nicardipine, now off  Blood pressure now stable   BP goal normotensive  COPD  Multiple hospitalizations for exacerbations  Respiratory failure, AE COPD.  On bronchial dilators. On prednisone taper.    Wean O2. (d/c from hospital 10/04/2017 for COPD)  Palliative Care Consult 10/03/2017 -  Pt desires Full Code / Full scope of treatment  OP CT chest scheduled for 8/26 - completed in hospital. No evidence of interstitial lung disease. COPD. chronic pulmonary arterial hypertension. 3VD. Aortic Atherosclerosis. postinfectious scarring and COPD. [facility] to reschedule OP pulm appt to discuss results  Hyperlipidemia  Home meds:  lipitor 80, resumed in hospital  LDL 73, goal < 70  Continue statin at discharge  Diabetes type II  HgbA1c 7.7, goal < 7.0  Uncontrolled  Back on home regimen  New Tradjenta  5mg  daily added  CKD stage III  baseline Cre (1.7-2.12)    Cre 2.12-> 1.7-> 1.89 -> 1.49  Hyperkalemia - K 5.7 ->received 30 gms kayexalate  Repeat K - 3.9   Pt will need Bmet next OV with primary care provider  4 mm ACOM aneurysm  Showing on CTA and MRA head  No intervention needed at this time  Follow up Dr Arlean Hopping office for outpatient consultation  Worsening paranasal sinusitis and mastoid effusions noted on MRI  The patient is asymptomatic and afebrile  F/U primary care provider  Other Stroke Risk Factors  Advanced age  Former cigarette smoker  Morbid obesity, Body mass index is 50.73 kg/m., recommend weight loss, diet and exercise as appropriate   Other Active Problems  Mild anemia of chronic disease, hemoglobin 10.4-> 7.7  Hyperkalemia - treated with kayexalate (see above)  DISCHARGE EXAM Blood pressure (!) 150/76, pulse 60, temperature 97.6 F (36.4 C), temperature source Oral, resp. rate 16, height 5\' 2"  (1.575 m), weight 125.8 kg, SpO2 97 %. Pleasant morbidly obese elderly lady currently SOB after attempting to get OOB with assistance. Afebrile. Head is nontraumatic. Neck is supple without  bruit. Cardiac exam no murmur or gallop. Lungs are clear to auscultation. Distal pulses are well felt.lingual swelling, angioedema appears to have resolved. Neurological Exam :  Awake alert oriented 3. Extremely hard of hearing. wears glasses to seen. Poor denture. Speech is dysarthric but can be understood. No aphasia. Extraocular moments are full range without nystagmus. Mild exotropia of the right eye which was at baseline.. Blinks to  threat bilaterally. face is symmetric without weakness. Tongue midline motor system exam symmetric upper  extremity strength without drift or focal weakness.symmetric lower extremity strength with 3/5 proximal and good strength distally. Deep tendon reflexes symmetric. Sensation is intact. Plantars are downgoing. Gait not  tested.  Discharge Diet   Dysphagia 3, thin liquids  Discharge Instructions For Patient 1. Be sure to see your primary care provider within 1 week or have MD at SNF address. Bmet blood test next visit. 2. Dr Arlean Hopping office will call you for an appointment to evaluate aortic aneurysm. 3. The Neurology office will call you for a follow up appointment. 5. Avoid high potassium foods. 6. Drink plenty of water.  7. Increase activity gradually as tolerated.   DISCHARGE PLAN  Disposition:  Discharge to skilled nursing facility for ongoing PT, OT and ST.   BMET in 1 week  Continue clopidogrel 75 mg daily for secondary stroke prevention.  Ongoing risk factor control by Primary Care Physician at time of discharge  Follow-up Johnson, Megan P, DO in 1-2 weeks.  Follow-up in [facility] in 4 weeks, office to schedule an appointment.   Follow-up Dr. Estanislado Pandy - office will contact patient for appointment  45 minutes were spent preparing discharge.  Burnetta Sabin, MSN, APRN, ANVP-BC, AGPCNP-BC Advanced Practice Stroke Nurse [place] for Schedule & Pager information 10/10/2017 8:40 AM   ATTENDING NOTE: I reviewed above note and agree with the assessment and plan. I have made any additions or clarifications directly to the above note. Pt was seen and examined.   No acute event overnight. Her insurance finally approved her for SNF and she is pending for SNF discharge. She will follow up with [facility] neurology in 4 weeks.   Rosalin Hawking, MD PhD Stroke Neurology 10/10/2017 3:05 PM

## 2017-10-08 ENCOUNTER — Ambulatory Visit: Admission: RE | Admit: 2017-10-08 | Payer: Medicare HMO | Source: Ambulatory Visit

## 2017-10-08 ENCOUNTER — Inpatient Hospital Stay (HOSPITAL_COMMUNITY): Payer: Medicare HMO

## 2017-10-08 DIAGNOSIS — G4733 Obstructive sleep apnea (adult) (pediatric): Secondary | ICD-10-CM

## 2017-10-08 DIAGNOSIS — R569 Unspecified convulsions: Secondary | ICD-10-CM

## 2017-10-08 DIAGNOSIS — E785 Hyperlipidemia, unspecified: Secondary | ICD-10-CM

## 2017-10-08 DIAGNOSIS — E1169 Type 2 diabetes mellitus with other specified complication: Secondary | ICD-10-CM

## 2017-10-08 DIAGNOSIS — J449 Chronic obstructive pulmonary disease, unspecified: Secondary | ICD-10-CM

## 2017-10-08 DIAGNOSIS — I671 Cerebral aneurysm, nonruptured: Secondary | ICD-10-CM

## 2017-10-08 HISTORY — DX: Unspecified convulsions: R56.9

## 2017-10-08 LAB — CBC
HCT: 36 % (ref 36.0–46.0)
HEMOGLOBIN: 11 g/dL — AB (ref 12.0–15.0)
MCH: 27.2 pg (ref 26.0–34.0)
MCHC: 30.6 g/dL (ref 30.0–36.0)
MCV: 88.9 fL (ref 78.0–100.0)
PLATELETS: 193 10*3/uL (ref 150–400)
RBC: 4.05 MIL/uL (ref 3.87–5.11)
RDW: 17.5 % — AB (ref 11.5–15.5)
WBC: 6.3 10*3/uL (ref 4.0–10.5)

## 2017-10-08 LAB — BASIC METABOLIC PANEL
Anion gap: 8 (ref 5–15)
BUN: 45 mg/dL — AB (ref 8–23)
CALCIUM: 8 mg/dL — AB (ref 8.9–10.3)
CHLORIDE: 105 mmol/L (ref 98–111)
CO2: 28 mmol/L (ref 22–32)
CREATININE: 1.7 mg/dL — AB (ref 0.44–1.00)
GFR, EST AFRICAN AMERICAN: 33 mL/min — AB (ref >60)
GFR, EST NON AFRICAN AMERICAN: 28 mL/min — AB (ref >60)
Glucose, Bld: 171 mg/dL — ABNORMAL HIGH (ref 70–99)
Potassium: 4 mmol/L (ref 3.5–5.1)
SODIUM: 141 mmol/L (ref 135–145)

## 2017-10-08 LAB — GLUCOSE, CAPILLARY
GLUCOSE-CAPILLARY: 163 mg/dL — AB (ref 70–99)
GLUCOSE-CAPILLARY: 245 mg/dL — AB (ref 70–99)
GLUCOSE-CAPILLARY: 271 mg/dL — AB (ref 70–99)
GLUCOSE-CAPILLARY: 367 mg/dL — AB (ref 70–99)
Glucose-Capillary: 162 mg/dL — ABNORMAL HIGH (ref 70–99)
Glucose-Capillary: 165 mg/dL — ABNORMAL HIGH (ref 70–99)

## 2017-10-08 MED ORDER — PREDNISONE 20 MG PO TABS
40.0000 mg | ORAL_TABLET | Freq: Every day | ORAL | Status: AC
  Administered 2017-10-08 – 2017-10-10 (×3): 40 mg via ORAL
  Filled 2017-10-08 (×3): qty 2

## 2017-10-08 MED ORDER — PREDNISONE 10 MG PO TABS: 40.0000 mg | ORAL_TABLET | Freq: Every day | ORAL | 0 refills | Status: DC

## 2017-10-08 MED ORDER — LINAGLIPTIN 5 MG PO TABS
5.0000 mg | ORAL_TABLET | Freq: Every day | ORAL | Status: DC
  Administered 2017-10-08 – 2017-10-10 (×3): 5 mg via ORAL
  Filled 2017-10-08 (×3): qty 1

## 2017-10-08 MED ORDER — PREDNISONE 20 MG PO TABS: 20.0000 mg | ORAL_TABLET | Freq: Every day | ORAL | Status: DC

## 2017-10-08 MED ORDER — PREDNISONE 5 MG PO TABS: 30.0000 mg | ORAL_TABLET | Freq: Every day | ORAL | Status: DC

## 2017-10-08 MED ORDER — PREDNISONE 5 MG PO TABS: 10.0000 mg | ORAL_TABLET | Freq: Every day | ORAL | Status: DC

## 2017-10-08 NOTE — Progress Notes (Signed)
CSW following for discharge plan. CSW confirmed bed available at [facility], and facility has initiated insurance authorization. CSW to follow.  Laveda Abbe, [place] Clinical Social Worker [phone]

## 2017-10-08 NOTE — Consult Note (Addendum)
   [facility] CM Inpatient Consult   10/08/2017  Katherine [name] 11/22/1940 [id number]    [name] is active with [place] Management program. She is followed by [facility]. Please see chart review tab the encounters for patient outreach details.  Chart reviewed. Noted discharge plan is for SNF. Went to bedside to speak with patient and husband. Patient seemed a little hard of hearing so was unable to effectively communicate with her. Husband was not present at the time of visit.  Spoke with inpatient RNCM to confirm discharge plan. Also made aware of [place] team request for rheumatologist consult and chest CT (was previously scheduled for 8/26) while hospitalized. However, patient is slated for discharge today.  Will continue to follow.   Marthenia Rolling, MSN-Ed, RN,BSN [facility] Liaison [phone]

## 2017-10-08 NOTE — Plan of Care (Signed)
Patient stable, discussed POC with patient, agreeable with plan, denies question/concerns at this time.  

## 2017-10-08 NOTE — Care Management Important Message (Signed)
Important Message  Patient Details  Name: Katherine [name] MRN: [id number] Date of Birth: 05-Dec-1940   Medicare Important Message Given:  No Patient thought that she signed in the ER and would not sign what I presented   Orbie Pyo 10/08/2017, 4:03 PM

## 2017-10-08 NOTE — Progress Notes (Addendum)
STROKE TEAM PROGRESS NOTE   INTERVAL HISTORY Very anxious for discharge.  She is agreeable to SNF and feels that is the best option for her.  She has no new complaints.  Vitals:   10/08/17 0757 10/08/17 0839 10/08/17 1142 10/08/17 1517  BP:  (!) 152/88 (!) 141/92 (!) 139/92  Pulse: 64 61 67 77  Resp: (!) 24 18 18 18   Temp:  98.8 F (37.1 C) 98.1 F (36.7 C) 98.1 F (36.7 C)  TempSrc:  Oral Oral Oral  SpO2: 93% 96% 95% 93%  Weight:      Height:        CBC:  Recent Labs  Lab 10/07/17 0552 10/08/17 0531  WBC 5.6 6.3  HGB 11.2* 11.0*  HCT 36.7 36.0  MCV 90.2 88.9  PLT 205 321    Basic Metabolic Panel:  Recent Labs  Lab 10/07/17 0552 10/08/17 0531  NA 144 141  K 4.9 4.0  CL 104 105  CO2 29 28  GLUCOSE 225* 171*  BUN 49* 45*  CREATININE 1.89* 1.70*  CALCIUM 8.6* 8.0*   Lipid Panel:     Component Value Date/Time   CHOL 183 10/05/2017 0631   CHOL 157 03/21/2017 0950   CHOL 148 06/15/2016 1046   TRIG 105 10/05/2017 0631   TRIG 109 06/15/2016 1046   HDL 89 10/05/2017 0631   HDL 42 03/21/2017 0950   CHOLHDL 2.1 10/05/2017 0631   VLDL 21 10/05/2017 0631   VLDL 22 06/15/2016 1046   LDLCALC 73 10/05/2017 0631   LDLCALC 85 03/21/2017 0950   HgbA1c:  Lab Results  Component Value Date   HGBA1C 7.7 (H) 10/05/2017   Urine Drug Screen:     Component Value Date/Time   LABOPIA NONE DETECTED 06/28/2017 2225   COCAINSCRNUR NONE DETECTED 06/28/2017 2225   LABBENZ NONE DETECTED 06/28/2017 2225   AMPHETMU NONE DETECTED 06/28/2017 2225   THCU NONE DETECTED 06/28/2017 2225   LABBARB NONE DETECTED 06/28/2017 2225    Alcohol Level     Component Value Date/Time   ETH <10 07/07/2017 1608    IMAGING Mr Jodene Nam Head Wo Contrast 10/05/2017 IMPRESSION:   MRI HEAD:  1. No acute intracranial process.  2. Mild-to-moderate chronic small vessel ischemic changes.   Old RIGHT basal ganglia lacunar infarct.  3. Worsening paranasal sinusitis and mastoid effusions.   MRA  HEAD:  1. No emergent large vessel occlusion or flow-limiting stenosis.  2. Mild intracranial atherosclerosis.  3. 4 mm A-comm aneurysm. Neuro-Interventional Radiology consultation is suggested to evaluate the appropriateness of potential treatment.   Dg Chest Port 1 View 10/05/2017 IMPRESSION:  1.  Cardiomegaly.  No pulmonary venous congestion.  2.  Mild basilar atelectasis, improved from prior exams.   Transthoracic Echocardiogram  10/05/2017 Left ventricle:  The cavity size was normal. Systolic function was normal. The estimated ejection fraction was in the range of 60% to 65%. Wall motion was normal; there were no regional wall motion abnormalities. There was an increased relative contribution of atrial contraction to ventricular filling, which may be seen with aging. The deceleration time of the early transmitral flow velocity was normal. The pulmonary vein flow pattern was normal. The tissue Doppler parameters were normal. Left ventricular diastolic function parameters were normal.  EEG 10/05/2017 IMPRESSION: The recording shows occasional frontal intermittent rhythmic delta activity typically seen in toxic metabolic processes.  Otherwise, the study is a normal recording of awake and drowsy states.    PHYSICAL EXAM Pleasant morbidly obese elderly lady  currently not in distress. Afebrile. Head is nontraumatic. Neck is supple without bruit. Cardiac exam no murmur or gallop. Lungs are clear to auscultation. Distal pulses are well felt.lingual swelling, angioedema appears to have resolved. Neurological Exam :  Awake alert oriented 3. Extremely hard of hearing. Poor denture, speech is slightly dysarthric but can be understood. No aphasia. Extraocular moments are full range without nystagmus. Mild exotropia of the right eye which was at baseline.. Blinks to  threat bilaterally. face is symmetric without weakness. Tongue midline motor system exam symmetric upper  extremity strength  without drift or focal weakness.symmetric lower extremity strength with 3/5 proximal and good strength distally. Deep tendon reflexes symmetric. Sensation is intact. Plantars are downgoing. Gait not tested. No significant change in exam since yesterday.  ASSESSMENT/PLAN Ms. Chinelo Benn is a 77 y.o. female with history of CKD T, HTN, LVH, tobacco use and recent COPD admission discharged 8/22, presenting to [facility] with expressive aphasia.  Received IV TPA 10/04/2017 at 0642.  Transfer to call for further evaluation and treatment.  Stroke-like episode s/p IV TPA - concerning for seizure   Speech difficulty episodes x2 -1 now and one in May.  Both with negative MRIs.  CT head No acute stroke. Small vessel disease.  Old lacunar infarct.    CTA head & neck aortic atherosclerosis. Atherosclerosis carotid bifurcations  MRI no acute stroke.  Small vessel disease.  Old right basal ganglia lacune.  Worsening paranasal sinusitis and mastoid effusions  MRA no LVO.  Mild atherosclerosis.  4 mm ACom aneurysm - F/U Dr Estanislado Pandy  2D Echo  - EF 60 - 65%. No cardiac source of emboli identified.   EEG -occasional FIRDA  LDL 73  HgbA1c 7.7  SCDs for VTE prophylaxis  Start Keppra 1 g IV now followed by 1000 mg Keppra ER daily  clopidogrel 75 mg daily prior to admission, now on Plavix 75 mg, continue at discharge.  Therapy recommendations: SNF - pt initially declined SNF ; however, after talking it over with family she has now agreed.  Awaiting insurance approval and bed availability 50/50 chance for today per SW.  Disposition: Pending  Hx of similar episode  06/2017 - hx stroke like episode 4 mos ago with difficulty speaking seen at [facility]. MRI neg for stroke. Cannot rule out TIA, concerning for seizure.  Now on Keppra  Hemoptysis/epistaxis following TPA treatment, resolved  Bleeding from nasal and oral cavity during transport to [facility].    CCM consulted  No need for  intubation.  resolved  Resolving pneumonitis, treated with Rocephin for 3 days  Hypertension  Blood pressure as high as 185/131   Treated with nicardipine, now off  Blood pressure now stable  . BP goal normotensive  Hyperlipidemia  Home meds:  lipitor 80, resumed in hospital  LDL 73, goal < 70  Continue statin at discharge  Diabetes type II  HgbA1c 7.7, goal < 7.0  Uncontrolled  Hyperglycemia  SSI  CBG monitoring  DM coordinator consultation placed - consider starting Tradjenta 5 mg daily for DM (not renally cleared) if appropriate  CKD stage III  baseline Cre (1.7-2.12)    Cre 2.12-> 1.87-> 1.89->1.7  Hyperkalemia - K 5.7 ->5.8 -> 4.9->4.0  Treated with Kayexalate  BMP monitoring in am  4 mm ACOM aneurysm  Showing on CTA and MRA head  No intervention needed at this time  follow-up as an outpatient - Dr Estanislado Pandy  Other Stroke Risk Factors  Advanced age  Former cigarette smoker  Morbid obesity, Body mass index is 50.73 kg/m., recommend weight loss, diet and exercise as appropriate   Other Active Problems  Respiratory failure, AE COPD.  On bronchial dilators. On IV Solu-Medrol with taper.    Wean O2. (d/c from hospital 10/04/2017 for COPD)  Mild anemia of chronic disease, hemoglobin 10.4->11.0  Interstitial lung disease.  High intensity CT chest ordered an an OP PTA for today to evaluate.  Will get done in [facility] day # [place], MSN, APRN, ANVP-BC, AGPCNP-BC Advanced Practice Stroke Nurse [place] for Schedule & Pager information 10/08/2017 4:31 PM   ATTENDING NOTE: I reviewed above note and agree with the assessment and plan. I have made any additions or clarifications directly to the above note. Pt was seen and examined.   Patient seen today sitting in chair for lunch.  No acute distress.  No acute event overnight.  Patient now agrees to SNF placement.  Hyperglycemia improved overnight,  however still high, partly due to steroids use.  Start him to taper off steroids today with 40 mg daily and [place] 1 decrease every 3 days.  Diabetic coordinator recommendations appreciated.  Will start [place].  Pending discharge to SNF.  Rosalin Hawking, MD PhD Stroke Neurology 10/08/2017 5:45 PM     To contact Stroke Continuity provider, please refer to [email or web address] After hours, contact General Neurology

## 2017-10-08 NOTE — Progress Notes (Signed)
Physical Therapy Treatment Patient Details Name: Katherine [name] MRN: [id number] DOB: [birth year] Today's Date: 10/08/2017    History of Present Illness 77 yo female admitted with admitted with acute on chronic respiratory failure from COPD exacerbation. She developed acute onset of aphasia on 8/22, s/p tPA with resultant epistaxis/hemoptysis. MRI showed no acute changes. PMH: COPD, chronic renal disease, hypertension, GERD, anxiety, HTN     PT Comments    Pt with significant SOB throughout session. Cues required for pursed lipped breathing. SpO2 remained >93% throughout activity. Pt able to progress out of bed to recliner chair with min A and RW for safety. Patient would benefit from continued skilled PT to maximize functional independence and activity tolerance. Will continue to follow acutely.    Follow Up Recommendations  SNF     Equipment Recommendations  None recommended by PT    Recommendations for Other Services       Precautions / Restrictions Precautions Precautions: Fall Precaution Comments: no falls reports by family and denies any falls in the year Restrictions Weight Bearing Restrictions: No    Mobility  Bed Mobility Overal bed mobility: Needs Assistance Bed Mobility: Supine to Sit     Supine to sit: Min assist     General bed mobility comments: Pt reaching for HHA by PTA and for bed rails to assist assist with bed mobilities.  Transfers Overall transfer level: Needs assistance Equipment used: Rolling walker (2 wheeled) Transfers: Sit to/from Stand(6x) Sit to Stand: Min assist;Min guard         General transfer comment: Pt with good hand placement. Min A to rise up from EOB. Min guard from recliner chair 5x. Cues for eccentric control.  Ambulation/Gait Ambulation/Gait assistance: Min assist Gait Distance (Feet): 20 Feet Assistive device: Rolling walker (2 wheeled);IV Pole Gait Pattern/deviations: Step-through pattern;Trunk flexed Gait velocity:  decreased Gait velocity interpretation: <1.31 ft/sec, indicative of household ambulator General Gait Details: pt with slow labored gait secondary to DOE and deconditioning. Flexed trunk throughout ambualtion. Pt appeared significantly SOB after gait, however SpO2 remained >93%.   Stairs             Wheelchair Mobility    Modified Rankin (Stroke Patients Only)       Balance Overall balance assessment: Needs assistance Sitting-balance support: Single extremity supported;Feet supported Sitting balance-Leahy Scale: Fair     Standing balance support: No upper extremity supported Standing balance-Leahy Scale: Fair Standing balance comment: able to statically stand w/o UE support                            Cognition Arousal/Alertness: Awake/alert Behavior During Therapy: WFL for tasks assessed/performed Overall Cognitive Status: Within Functional Limits for tasks assessed(baseline and family agre=es)                                 General Comments: HOH      Exercises      General Comments        Pertinent Vitals/Pain Pain Assessment: No/denies pain    Home Living                      Prior Function            PT Goals (current goals can now be found in the care plan section) Acute Rehab PT Goals Patient Stated Goal: to get rehab PT Goal Formulation: With  patient Time For Goal Achievement: 10/12/17 Potential to Achieve Goals: Good Progress towards PT goals: Progressing toward goals    Frequency    Min 2X/week      PT Plan Current plan remains appropriate    Co-evaluation              AM-PAC PT "6 Clicks" Daily Activity  Outcome Measure  Difficulty turning over in bed (including adjusting bedclothes, sheets and blankets)?: Unable Difficulty moving from lying on back to sitting on the side of the bed? : Unable Difficulty sitting down on and standing up from a chair with arms (e.g., wheelchair, bedside  commode, etc,.)?: Unable Help needed moving to and from a bed to chair (including a wheelchair)?: A Little Help needed walking in hospital room?: A Little Help needed climbing 3-5 steps with a railing? : A Lot 6 Click Score: 11    End of Session Equipment Utilized During Treatment: Oxygen;Gait belt Activity Tolerance: Patient limited by fatigue Patient left: in chair;with call bell/phone within reach Nurse Communication: Mobility status PT Visit Diagnosis: Unsteadiness on feet (R26.81);Difficulty in walking, not elsewhere classified (R26.2)     Time: 4076-8088 PT Time Calculation (min) (ACUTE ONLY): 16 min  Charges:  $Gait Training: 8-22 mins                     Benjiman Core, [place] Pager [phone] Acute Rehab   Allena Katz 10/08/2017, 11:01 AM

## 2017-10-08 NOTE — Progress Notes (Signed)
Inpatient Diabetes Program Recommendations  AACE/ADA: New Consensus Statement on Inpatient Glycemic Control (2015)  Target Ranges:  Prepandial:   less than 140 mg/dL      Peak postprandial:   less than 180 mg/dL (1-2 hours)      Critically ill patients:  140 - 180 mg/dL   Lab Results  Component Value Date   GLUCAP 245 (H) 10/08/2017   HGBA1C 7.7 (H) 10/05/2017    Review of Glycemic Control Results for Katherine [name], Katherine [name] (MRN [id number]) as of 10/08/2017 15:43  Ref. Range 10/07/2017 15:38 10/07/2017 16:23 10/07/2017 19:58 10/07/2017 23:21 10/08/2017 04:16 10/08/2017 06:39 10/08/2017 07:39 10/08/2017 11:40  Glucose-Capillary Latest Ref Range: 70 - 99 mg/dL >600 (HH) 373 (H) 231 (H) 188 (H) 163 (H) 162 (H) 165 (H) 245 (H)   Diabetes history: New diagnosis due to A1C=7.7%- Outpatient Diabetes medications: None Current orders for Inpatient glycemic control:  Novolog resistant tid with meals and HS Prednisone taper Inpatient Diabetes Program Recommendations:   Patient was spoken to by DM coordinator on 8/21 regarding diagnosis of DM.  Note history of COPD and steroid use which likely has contributed to increased A1C.  As steroids reduced, blood sugars should improve.  May consider starting Tradjenta 5 mg daily for DM (not renally cleared) if appropriate.  Blood sugars improving today.  Note plans for d/c to SNF.    Thanks,  Adah Perl, RN, BC-ADM Inpatient Diabetes Coordinator Pager [phone] (8a-5p)

## 2017-10-09 LAB — BASIC METABOLIC PANEL
ANION GAP: 11 (ref 5–15)
BUN: 42 mg/dL — ABNORMAL HIGH (ref 8–23)
CHLORIDE: 102 mmol/L (ref 98–111)
CO2: 28 mmol/L (ref 22–32)
CREATININE: 1.49 mg/dL — AB (ref 0.44–1.00)
Calcium: 8.7 mg/dL — ABNORMAL LOW (ref 8.9–10.3)
GFR calc non Af Amer: 33 mL/min — ABNORMAL LOW (ref >60)
GFR, EST AFRICAN AMERICAN: 38 mL/min — AB (ref >60)
Glucose, Bld: 99 mg/dL (ref 70–99)
Potassium: 3.9 mmol/L (ref 3.5–5.1)
SODIUM: 141 mmol/L (ref 135–145)

## 2017-10-09 LAB — GLUCOSE, CAPILLARY
GLUCOSE-CAPILLARY: 105 mg/dL — AB (ref 70–99)
GLUCOSE-CAPILLARY: 212 mg/dL — AB (ref 70–99)
GLUCOSE-CAPILLARY: 274 mg/dL — AB (ref 70–99)
GLUCOSE-CAPILLARY: 251 mg/dL — AB (ref 70–99)
Glucose-Capillary: 264 mg/dL — ABNORMAL HIGH (ref 70–99)

## 2017-10-09 LAB — CBC
HCT: 36.9 % (ref 36.0–46.0)
HEMOGLOBIN: 11.3 g/dL — AB (ref 12.0–15.0)
MCH: 27 pg (ref 26.0–34.0)
MCHC: 30.6 g/dL (ref 30.0–36.0)
MCV: 88.1 fL (ref 78.0–100.0)
Platelets: 198 10*3/uL (ref 150–400)
RBC: 4.19 MIL/uL (ref 3.87–5.11)
RDW: 17.7 % — ABNORMAL HIGH (ref 11.5–15.5)
WBC: 7.7 10*3/uL (ref 4.0–10.5)

## 2017-10-09 MED ORDER — PREDNISONE 10 MG PO TABS: 30.0000 mg | ORAL_TABLET | Freq: Every day | ORAL | 0 refills | Status: DC

## 2017-10-09 MED ORDER — PREDNISONE 10 MG PO TABS: 10.0000 mg | ORAL_TABLET | Freq: Every day | ORAL | 0 refills | Status: DC

## 2017-10-09 MED ORDER — LINAGLIPTIN 5 MG PO TABS: 5.0000 mg | ORAL_TABLET | Freq: Every day | ORAL | 2 refills | Status: DC

## 2017-10-09 MED ORDER — PREDNISONE 20 MG PO TABS: 20.0000 mg | ORAL_TABLET | Freq: Every day | ORAL | 0 refills | Status: DC

## 2017-10-09 MED ORDER — INSULIN ASPART 100 UNIT/ML ~~LOC~~ SOLN
0.0000 [IU] | Freq: Three times a day (TID) | SUBCUTANEOUS | Status: DC
  Administered 2017-10-10 (×2): 7 [IU] via SUBCUTANEOUS

## 2017-10-09 NOTE — Progress Notes (Signed)
Inpatient Diabetes Program Recommendations  AACE/ADA: New Consensus Statement on Inpatient Glycemic Control (2015)  Target Ranges:  Prepandial:   less than 140 mg/dL      Peak postprandial:   less than 180 mg/dL (1-2 hours)      Critically ill patients:  140 - 180 mg/dL   Lab Results  Component Value Date   GLUCAP 105 (H) 10/09/2017   HGBA1C 7.7 (H) 10/05/2017    Review of Glycemic ControlResults for MERIE, WULF (MRN [id number]) as of 10/09/2017 10:25  Ref. Range 10/08/2017 07:39 10/08/2017 11:40 10/08/2017 17:16 10/08/2017 21:57 10/09/2017 06:23  Glucose-Capillary Latest Ref Range: 70 - 99 mg/dL 165 (H) 245 (H) 271 (H) 367 (H) 105 (H)   Diabetes history: New diagnosis due to A1C=7.7% Outpatient Diabetes medications: None Current orders for Inpatient glycemic control:  Novolog resistant tid with meals and HS Prednisone taper  Inpatient Diabetes Program Recommendations:   Please d/c resistant correction at HS (note patient received 20 units of Novolog at HS last night). Consider adding Tradjenta 5 mg daily for outpatient. May also need Novolog meal coverage 4 units tid with mealswhile on Prednisone.  However once steroids stopped, she should not need insulin.   Thanks,  Adah Perl, RN, BC-ADM Inpatient Diabetes Coordinator Pager [phone] (8a-5p)

## 2017-10-09 NOTE — Progress Notes (Addendum)
CSW received phone call from floor. Pt's son is at [employer] stating that pt is to go to [facility]. CSW attempted to call [facility] main number multiple times to see if insurance authorization came through, no answer.   RN received phone call from son that they are at the facility waiting for the pt. CSW attempted to call pt's son, Mliss Fritz at [phone], CSW left voicemail. CSW spoke with pt's husband, pt's husband stated that pt is waiting for insurance authorization to come through for pt to go to SNF.CSW given another number for pt's son, Ilona Sorrel, [phone]. CSW left voicemail.   CSW attempted to call [facility] at [phone], again. No answer and no voicemail option.   Per notes insurance authorization has not been received by the facility. CSW attempted to call [facility] multiple times, with no answer. CSW will leave handoff for daytime CSW to follow up. CSW updated floor RN.   CSW will continue to follow.   Update 7:55 PM CSW received phone call back from pt's son, Mliss Fritz from [phone]. Pt's son stated that facility was waiting for pt. Pt assigned room number (751) at [facility]. Pt's son provided CSW with another number for [facility], [phone]. CSW called, no answer, no voicemail. CSW informed pt's son that if we cannot get in touch with someone from facility to confirm that pt is able to transport there, pt will have to go tomorrow. CSW called [facility] at [phone], no answer, unable to leave voicemail.   Wendelyn Breslow, [facility]  [phone]

## 2017-10-09 NOTE — Consult Note (Signed)
   [facility] CM Inpatient Consult   10/09/2017  Nuha Degner 1940-03-24 [id number]   [facility] Care Management follow up.  Attempted to make follow up appointment with Dr. Meda Coffee- Rheumatologist with [facility]. Advised that appointment will need to be made upon discharge from SNF.  Telephone call made to patient's husband to request that he reschedule Rheumatologist appointment upon discharge from SNF. Sent Mr. Braaten office contact information. He is agreeable to doing this.  Updated [place].  Appreciative of inpatient [facility] collaboration.   Marthenia Rolling, MSN-Ed, RN,BSN [facility] Liaison [phone]

## 2017-10-09 NOTE — Progress Notes (Signed)
STROKE TEAM PROGRESS NOTE   INTERVAL HISTORY Pt sitting in chair without distress. No acute event overnight. Waiting for SNF placement.   Vitals:   10/09/17 0841 10/09/17 0911 10/09/17 1222 10/09/17 1530  BP:  137/67 130/69 122/70  Pulse: 67 62 71 73  Resp: 20 16 20 20   Temp:  97.9 F (36.6 C) 98.2 F (36.8 C) 98.7 F (37.1 C)  TempSrc:  Oral Oral Oral  SpO2: 95% 97% 100% 97%  Weight:      Height:        CBC:  Recent Labs  Lab 10/08/17 0531 10/09/17 0434  WBC 6.3 7.7  HGB 11.0* 11.3*  HCT 36.0 36.9  MCV 88.9 88.1  PLT 193 440    Basic Metabolic Panel:  Recent Labs  Lab 10/08/17 0531 10/09/17 0434  NA 141 141  K 4.0 3.9  CL 105 102  CO2 28 28  GLUCOSE 171* 99  BUN 45* 42*  CREATININE 1.70* 1.49*  CALCIUM 8.0* 8.7*   Lipid Panel:     Component Value Date/Time   CHOL 183 10/05/2017 0631   CHOL 157 03/21/2017 0950   CHOL 148 06/15/2016 1046   TRIG 105 10/05/2017 0631   TRIG 109 06/15/2016 1046   HDL 89 10/05/2017 0631   HDL 42 03/21/2017 0950   CHOLHDL 2.1 10/05/2017 0631   VLDL 21 10/05/2017 0631   VLDL 22 06/15/2016 1046   LDLCALC 73 10/05/2017 0631   LDLCALC 85 03/21/2017 0950   HgbA1c:  Lab Results  Component Value Date   HGBA1C 7.7 (H) 10/05/2017   Urine Drug Screen:     Component Value Date/Time   LABOPIA NONE DETECTED 06/28/2017 2225   COCAINSCRNUR NONE DETECTED 06/28/2017 2225   LABBENZ NONE DETECTED 06/28/2017 2225   AMPHETMU NONE DETECTED 06/28/2017 2225   THCU NONE DETECTED 06/28/2017 2225   LABBARB NONE DETECTED 06/28/2017 2225    Alcohol Level     Component Value Date/Time   ETH <10 07/07/2017 1608    IMAGING Mr Jodene Nam Head Wo Contrast 10/05/2017 IMPRESSION:   MRI HEAD:  1. No acute intracranial process.  2. Mild-to-moderate chronic small vessel ischemic changes.   Old RIGHT basal ganglia lacunar infarct.  3. Worsening paranasal sinusitis and mastoid effusions.   MRA HEAD:  1. No emergent large vessel occlusion or  flow-limiting stenosis.  2. Mild intracranial atherosclerosis.  3. 4 mm A-comm aneurysm. Neuro-Interventional Radiology consultation is suggested to evaluate the appropriateness of potential treatment.   Dg Chest Port 1 View 10/05/2017 IMPRESSION:  1.  Cardiomegaly.  No pulmonary venous congestion.  2.  Mild basilar atelectasis, improved from prior exams.   Transthoracic Echocardiogram  10/05/2017 Left ventricle:  The cavity size was normal. Systolic function was normal. The estimated ejection fraction was in the range of 60% to 65%. Wall motion was normal; there were no regional wall motion abnormalities. There was an increased relative contribution of atrial contraction to ventricular filling, which may be seen with aging. The deceleration time of the early transmitral flow velocity was normal. The pulmonary vein flow pattern was normal. The tissue Doppler parameters were normal. Left ventricular diastolic function parameters were normal.  EEG 10/05/2017 IMPRESSION: The recording shows occasional frontal intermittent rhythmic delta activity typically seen in toxic metabolic processes.  Otherwise, the study is a normal recording of awake and drowsy states.    PHYSICAL EXAM Pleasant morbidly obese elderly lady currently not in distress. Afebrile. Head is nontraumatic. Neck is supple without bruit. Cardiac  exam no murmur or gallop. Lungs are clear to auscultation. Distal pulses are well felt.lingual swelling, angioedema appears to have resolved. Neurological Exam :  Awake alert oriented 3. Extremely hard of hearing. Poor denture, speech is slightly dysarthric but can be understood. No aphasia. Extraocular moments are full range without nystagmus. Mild exotropia of the right eye which was at baseline.. Blinks to  threat bilaterally. face is symmetric without weakness. Tongue midline motor system exam symmetric upper  extremity strength without drift or focal weakness.symmetric lower  extremity strength with 3/5 proximal and good strength distally. Deep tendon reflexes symmetric. Sensation is intact. Plantars are downgoing. Gait not tested. No significant change in exam since yesterday.  ASSESSMENT/PLAN [name] is a 77 y.o. female with history of CKD T, HTN, LVH, tobacco use and recent COPD admission discharged 8/22, presenting to [facility] with expressive aphasia.  Received IV TPA 10/04/2017 at 0642.  Transfer to call for further evaluation and treatment.  Stroke-like episode s/p IV TPA - concerning for seizure   Speech difficulty episodes x2 -1 now and one in May.  Both with negative MRIs.  CT head No acute stroke. Small vessel disease.  Old lacunar infarct.    CTA head & neck aortic atherosclerosis. Atherosclerosis carotid bifurcations  MRI no acute stroke.  Small vessel disease.  Old right basal ganglia lacune.  Worsening paranasal sinusitis and mastoid effusions  MRA no LVO.  Mild atherosclerosis.  4 mm ACom aneurysm - F/U Dr Estanislado Pandy  2D Echo  - EF 60 - 65%. No cardiac source of emboli identified.   EEG -occasional FIRDA  LDL 73  HgbA1c 7.7  SCDs for VTE prophylaxis  Start Keppra 1 g IV now followed by 1000 mg Keppra ER daily  clopidogrel 75 mg daily prior to admission, now on Plavix 75 mg, continue at discharge.  Therapy recommendations: SNF - pt initially declined SNF ; however, after talking it over with family she has now agreed.  Awaiting insurance approval and bed availability 50/50 chance for today per SW.  Disposition: Pending  Hx of similar episode  06/2017 - hx stroke like episode 4 mos ago with difficulty speaking seen at [facility]. MRI neg for stroke. Cannot rule out TIA, concerning for seizure.  Now on Keppra  Hemoptysis/epistaxis following TPA treatment, resolved  Bleeding from nasal and oral cavity during transport to [facility].    CCM consulted  No need for intubation.  resolved  Resolving pneumonitis, treated with Rocephin for  3 days  Hypertension  Blood pressure as high as 185/131   Treated with nicardipine, now off  Blood pressure now stable  . BP goal normotensive  Hyperlipidemia  Home meds:  lipitor 80, resumed in hospital  LDL 73, goal < 70  Continue statin at discharge  Diabetes type II  HgbA1c 7.7, goal < 7.0  Uncontrolled  Hyperglycemia  SSI  CBG monitoring  DM coordinator recs appreciated  on Tradjenta 5 mg daily  CKD stage III  baseline Cre (1.7-2.12)    Cre 2.12-> 1.87-> 1.89->1.7->1.49  Hyperkalemia resolved - K 5.7 ->5.8 -> 4.9->4.0->3.9  Treated with Kayexalate  BMP monitoring in am  4 mm ACOM aneurysm  Showing on CTA and MRA head  No intervention needed at this time  follow-up as an outpatient - Dr Estanislado Pandy made aware - he will call pt once pt out of SNF  Other Stroke Risk Factors  Advanced age  Former cigarette smoker  Morbid obesity, Body mass index is 50.73 kg/m., recommend  weight loss, diet and exercise as appropriate   Other Active Problems  Respiratory failure, AE COPD.  On bronchial dilators. On IV Solu-Medrol with taper.    Wean O2. (d/c from hospital 10/04/2017 for COPD)  Mild anemia of chronic disease, hemoglobin 10.4->11.0->11.3  Interstitial lung disease.  High intensity CT chest done showed no interstitial lung disease but postinfectious scarring and COPD.   Hospital day # 5  Rosalin Hawking, MD PhD Stroke Neurology 10/09/2017 6:22 PM     To contact Stroke Continuity provider, please refer to [email or web address] After hours, contact General Neurology

## 2017-10-10 DIAGNOSIS — J9621 Acute and chronic respiratory failure with hypoxia: Secondary | ICD-10-CM | POA: Diagnosis not present

## 2017-10-10 DIAGNOSIS — R262 Difficulty in walking, not elsewhere classified: Secondary | ICD-10-CM | POA: Diagnosis not present

## 2017-10-10 DIAGNOSIS — E7849 Other hyperlipidemia: Secondary | ICD-10-CM | POA: Diagnosis not present

## 2017-10-10 DIAGNOSIS — G4733 Obstructive sleep apnea (adult) (pediatric): Secondary | ICD-10-CM | POA: Diagnosis not present

## 2017-10-10 DIAGNOSIS — M6281 Muscle weakness (generalized): Secondary | ICD-10-CM | POA: Diagnosis not present

## 2017-10-10 DIAGNOSIS — A419 Sepsis, unspecified organism: Secondary | ICD-10-CM | POA: Diagnosis not present

## 2017-10-10 DIAGNOSIS — Z6841 Body Mass Index (BMI) 40.0 and over, adult: Secondary | ICD-10-CM | POA: Diagnosis not present

## 2017-10-10 DIAGNOSIS — R739 Hyperglycemia, unspecified: Secondary | ICD-10-CM | POA: Diagnosis not present

## 2017-10-10 DIAGNOSIS — E1121 Type 2 diabetes mellitus with diabetic nephropathy: Secondary | ICD-10-CM | POA: Diagnosis not present

## 2017-10-10 DIAGNOSIS — R4182 Altered mental status, unspecified: Secondary | ICD-10-CM | POA: Diagnosis not present

## 2017-10-10 DIAGNOSIS — G9341 Metabolic encephalopathy: Secondary | ICD-10-CM | POA: Diagnosis not present

## 2017-10-10 DIAGNOSIS — F419 Anxiety disorder, unspecified: Secondary | ICD-10-CM | POA: Diagnosis present

## 2017-10-10 DIAGNOSIS — J9622 Acute and chronic respiratory failure with hypercapnia: Secondary | ICD-10-CM | POA: Diagnosis not present

## 2017-10-10 DIAGNOSIS — Z87891 Personal history of nicotine dependence: Secondary | ICD-10-CM | POA: Diagnosis not present

## 2017-10-10 DIAGNOSIS — M2550 Pain in unspecified joint: Secondary | ICD-10-CM | POA: Diagnosis not present

## 2017-10-10 DIAGNOSIS — N179 Acute kidney failure, unspecified: Secondary | ICD-10-CM | POA: Diagnosis not present

## 2017-10-10 DIAGNOSIS — R4701 Aphasia: Secondary | ICD-10-CM | POA: Diagnosis not present

## 2017-10-10 DIAGNOSIS — I2510 Atherosclerotic heart disease of native coronary artery without angina pectoris: Secondary | ICD-10-CM | POA: Diagnosis not present

## 2017-10-10 DIAGNOSIS — Z23 Encounter for immunization: Secondary | ICD-10-CM | POA: Diagnosis not present

## 2017-10-10 DIAGNOSIS — J440 Chronic obstructive pulmonary disease with acute lower respiratory infection: Secondary | ICD-10-CM | POA: Diagnosis not present

## 2017-10-10 DIAGNOSIS — Z7401 Bed confinement status: Secondary | ICD-10-CM | POA: Diagnosis not present

## 2017-10-10 DIAGNOSIS — R29810 Facial weakness: Secondary | ICD-10-CM | POA: Diagnosis not present

## 2017-10-10 DIAGNOSIS — F418 Other specified anxiety disorders: Secondary | ICD-10-CM | POA: Diagnosis not present

## 2017-10-10 DIAGNOSIS — N183 Chronic kidney disease, stage 3 (moderate): Secondary | ICD-10-CM | POA: Diagnosis present

## 2017-10-10 DIAGNOSIS — J449 Chronic obstructive pulmonary disease, unspecified: Secondary | ICD-10-CM | POA: Diagnosis not present

## 2017-10-10 DIAGNOSIS — K219 Gastro-esophageal reflux disease without esophagitis: Secondary | ICD-10-CM | POA: Diagnosis not present

## 2017-10-10 DIAGNOSIS — E1159 Type 2 diabetes mellitus with other circulatory complications: Secondary | ICD-10-CM | POA: Diagnosis not present

## 2017-10-10 DIAGNOSIS — J189 Pneumonia, unspecified organism: Secondary | ICD-10-CM | POA: Diagnosis not present

## 2017-10-10 DIAGNOSIS — E1122 Type 2 diabetes mellitus with diabetic chronic kidney disease: Secondary | ICD-10-CM | POA: Diagnosis present

## 2017-10-10 DIAGNOSIS — R569 Unspecified convulsions: Secondary | ICD-10-CM | POA: Diagnosis not present

## 2017-10-10 DIAGNOSIS — G459 Transient cerebral ischemic attack, unspecified: Secondary | ICD-10-CM | POA: Diagnosis not present

## 2017-10-10 DIAGNOSIS — Y95 Nosocomial condition: Secondary | ICD-10-CM | POA: Diagnosis present

## 2017-10-10 DIAGNOSIS — F329 Major depressive disorder, single episode, unspecified: Secondary | ICD-10-CM | POA: Diagnosis present

## 2017-10-10 DIAGNOSIS — I10 Essential (primary) hypertension: Secondary | ICD-10-CM | POA: Diagnosis not present

## 2017-10-10 DIAGNOSIS — R42 Dizziness and giddiness: Secondary | ICD-10-CM | POA: Diagnosis not present

## 2017-10-10 DIAGNOSIS — I671 Cerebral aneurysm, nonruptured: Secondary | ICD-10-CM | POA: Diagnosis not present

## 2017-10-10 DIAGNOSIS — F3289 Other specified depressive episodes: Secondary | ICD-10-CM | POA: Diagnosis not present

## 2017-10-10 DIAGNOSIS — R0602 Shortness of breath: Secondary | ICD-10-CM | POA: Diagnosis not present

## 2017-10-10 DIAGNOSIS — J441 Chronic obstructive pulmonary disease with (acute) exacerbation: Secondary | ICD-10-CM | POA: Diagnosis not present

## 2017-10-10 DIAGNOSIS — E875 Hyperkalemia: Secondary | ICD-10-CM | POA: Diagnosis not present

## 2017-10-10 DIAGNOSIS — R6520 Severe sepsis without septic shock: Secondary | ICD-10-CM | POA: Diagnosis present

## 2017-10-10 DIAGNOSIS — E119 Type 2 diabetes mellitus without complications: Secondary | ICD-10-CM | POA: Diagnosis not present

## 2017-10-10 DIAGNOSIS — I639 Cerebral infarction, unspecified: Secondary | ICD-10-CM | POA: Diagnosis not present

## 2017-10-10 DIAGNOSIS — E785 Hyperlipidemia, unspecified: Secondary | ICD-10-CM | POA: Diagnosis not present

## 2017-10-10 DIAGNOSIS — R269 Unspecified abnormalities of gait and mobility: Secondary | ICD-10-CM | POA: Diagnosis present

## 2017-10-10 LAB — GLUCOSE, CAPILLARY
Glucose-Capillary: 212 mg/dL — ABNORMAL HIGH (ref 70–99)
Glucose-Capillary: 130 mg/dL — ABNORMAL HIGH (ref 70–99)
Glucose-Capillary: 245 mg/dL — ABNORMAL HIGH (ref 70–99)

## 2017-10-10 NOTE — Progress Notes (Signed)
Patient discharging to [facility] All belonging sent with [facility] Nurse will call report

## 2017-10-10 NOTE — Clinical Social Work Placement (Signed)
Nurse to call report to [phone], Room [place]  NOTE  Date:  10/10/2017  Patient Details  Name: Katherine [name] MRN: [id number] Date of Birth: 03-31-1940  Clinical Social Work is seeking post-discharge placement for this patient at the [place] level of care (*CSW will initial, date and re-position this form in  chart as items are completed):  Yes   Patient/family provided with [facility] Work Department's list of facilities offering this level of care within the geographic area requested by the patient (or if unable, by the patient's family).  Yes   Patient/family informed of their freedom to choose among providers that offer the needed level of care, that participate in Medicare, Medicaid or managed care program needed by the patient, have an available bed and are willing to accept the patient.  Yes   Patient/family informed of [facility]'s ownership interest in [facility] and [facility], as well as of the fact that they are under no obligation to receive care at these facilities.  PASRR submitted to EDS on 10/10/17     PASRR number received on       Existing PASRR number confirmed on 10/10/17     FL2 transmitted to all facilities in geographic area requested by pt/family on 10/10/17     FL2 transmitted to all facilities within larger geographic area on       Patient informed that his/her managed care company has contracts with or will negotiate with certain facilities, including the following:        Yes   Patient/family informed of bed offers received.  Patient chooses bed at [facility]     Physician recommends and patient chooses bed at      Patient to be transferred to [facility] on 10/10/17.  Patient to be transferred to facility by [facility]     Patient family notified on 10/10/17 of transfer.  Name of family member notified:  Husband     PHYSICIAN        Additional Comment:    _______________________________________________ Geralynn Ochs, LCSW 10/10/2017, 1:01 PM

## 2017-10-10 NOTE — NC FL2 (Signed)
[place] LEVEL OF CARE SCREENING TOOL     IDENTIFICATION  Patient Name: Katherine [name] Birthdate: Jul 11, 1940 Sex: female Admission Date (Current Location): 10/04/2017  [facility] and [place] Number:  Herbalist and Address:  [facility] [place], [place] [address], [place], [place] [address]      Provider Number: [id number]  Attending Physician Name and Address:  Rosalin Hawking, MD  Relative Name and Phone Number:  Abelino Derrick, [phone]    Current Level of Care: Hospital Recommended Level of Care: [place] Prior Approval Number:    Date Approved/Denied:   PASRR Number: [id number]  Discharge Plan: SNF    Current Diagnoses: Patient Active Problem List   Diagnosis Date Noted  . Possible Seizures ([place]) 10/08/2017  . Hyperlipemia 10/08/2017  . 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 Stroke-like episode ([place]) s/p IV tpa 10/04/2017  . Palliative care by specialist   . CAP (community acquired pneumonia) 09/11/2017  . Elevated rheumatoid factor 09/05/2017  . COPD exacerbation ([place]) 08/27/2017  . Frequent hospital admissions 08/22/2017  . Aphasia 06/28/2017  . Acute on chronic respiratory failure with hypoxia ([place]) 06/12/2017  . Hand pain 03/21/2017  . Dry skin 03/21/2017  . Pain in finger of left hand 09/12/2016  . Chronic fatigue 06/12/2016  . Left knee pain 06/12/2016  . Goals of care, counseling/discussion 03/13/2016  . Primary localized osteoarthritis of right knee 02/08/2016  . OSA and COPD overlap syndrome ([place]) 09/16/2015  . Rotator cuff syndrome 09/07/2015  . 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 Sleep disturbance 04/14/2015  . Coronary artery disease 03/14/2015  . 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 Anxiety state   . Tobacco abuse   . Prurigo nodularis   . GERD (gastroesophageal reflux disease)   . COPD (chronic obstructive pulmonary disease) ([place])   .  Osteoarthritis   . Vitamin D deficiency disease   . Chronic constipation   . CKD (chronic kidney disease), stage III ([place])   . LVH (left ventricular hypertrophy) 05/20/2013  . Essential hypertension 05/20/2013  . Morbid obesity ([place]) 05/20/2013    Orientation RESPIRATION BLADDER Height & Weight     Self, Place  O2([place] 2L) Incontinent Weight: 277 lb 5.4 oz (125.8 kg) Height:  5\' 2"  (157.5 cm)  BEHAVIORAL SYMPTOMS/MOOD NEUROLOGICAL BOWEL NUTRITION STATUS      Continent Diet(mechanical soft)  AMBULATORY STATUS COMMUNICATION OF NEEDS Skin   Extensive Assist Verbally Normal                       Personal Care Assistance Level of Assistance  Bathing, Dressing, Feeding Bathing Assistance: Maximum assistance Feeding assistance: Independent Dressing Assistance: Maximum assistance     Functional Limitations Info  Sight, Hearing, Speech Sight Info: Impaired(right eye, vision impaired) Hearing Info: Impaired Speech Info: Impaired(slurred/dysarthria)    SPECIAL CARE FACTORS FREQUENCY  PT (By licensed PT), OT (By licensed OT), Speech therapy     PT Frequency: 5x/wk OT Frequency: 5x/wk     Speech Therapy Frequency: 5x/wk      Contractures Contractures Info: Not present    Additional Factors Info  Code Status, Allergies, Psychotropic, Insulin Sliding Scale Code Status Info: Full Allergies Info: Bee Venom, Enalapril Maleate Psychotropic Info: Prozac 10mg  daily; Xanax 0.5mg  2x/day as needed Insulin Sliding Scale Info: 0-20 units 3x/day with meals       Current Medications (10/10/2017):  This is the current hospital active  medication list Current Facility-Administered Medications  Medication Dose Route Frequency Provider Last Rate Last Dose  .  stroke: mapping our early stages of recovery book   Does not apply Once Donzetta Starch, NP      . ALPRAZolam Duanne Moron) tablet 0.5 mg  0.5 mg Oral BID PRN Donzetta Starch, NP   0.5 mg at 10/06/17 0851  . atorvastatin (LIPITOR) tablet 80  mg  80 mg Oral Daily Burnetta Sabin L, NP   80 mg at 10/10/17 1152  . bisacodyl (DULCOLAX) suppository 10 mg  10 mg Rectal Once Rosalin Hawking, MD      . budesonide (PULMICORT) nebulizer solution 0.5 mg  0.5 mg Nebulization BID Burnetta Sabin L, NP   0.5 mg at 10/10/17 1017  . clopidogrel (PLAVIX) tablet 75 mg  75 mg Oral Daily Burnetta Sabin L, NP   75 mg at 10/10/17 1153  . diltiazem (CARDIZEM SR) 12 hr capsule 120 mg  120 mg Oral BID Burnetta Sabin L, NP   120 mg at 10/10/17 1153  . FLUoxetine (PROZAC) capsule 10 mg  10 mg Oral Daily Burnetta Sabin L, NP   10 mg at 10/10/17 1152  . gabapentin (NEURONTIN) capsule 300 mg  300 mg Oral QHS Burnetta Sabin L, NP   300 mg at 10/09/17 2305  . hydrALAZINE (APRESOLINE) tablet 25 mg  25 mg Oral TID Donzetta Starch, NP   25 mg at 10/10/17 1153  . insulin aspart (novoLOG) injection 0-20 Units  0-20 Units Subcutaneous TID WC Donzetta Starch, NP   7 Units at 10/10/17 1215  . ipratropium-albuterol (DUONEB) 0.5-2.5 (3) MG/3ML nebulizer solution 3 mL  3 mL Nebulization Q6H PRN Donzetta Starch, NP      . levETIRAcetam (KEPPRA XR) 24 hr tablet 1,000 mg  1,000 mg Oral Daily Burnetta Sabin L, NP   1,000 mg at 10/10/17 1152  . linagliptin (TRADJENTA) tablet 5 mg  5 mg Oral Daily Rosalin Hawking, MD   5 mg at 10/10/17 1153  . loratadine (CLARITIN) tablet 10 mg  10 mg Oral Daily Burnetta Sabin L, NP   10 mg at 10/10/17 1153  . MEDLINE mouth rinse  15 mL Mouth Rinse BID Burnetta Sabin L, NP   15 mL at 10/10/17 1156  . pantoprazole (PROTONIX) EC tablet 40 mg  40 mg Oral Daily Burnetta Sabin L, NP   40 mg at 10/10/17 1153  . [START ON 10/17/2017] predniSONE (DELTASONE) tablet 10 mg  10 mg Oral Q breakfast Blenda Nicely, [facility]      . [START ON 10/14/2017] predniSONE (DELTASONE) tablet 20 mg  20 mg Oral Q breakfast Blenda Nicely, [facility]      . [START ON 10/11/2017] predniSONE (DELTASONE) tablet 30 mg  30 mg Oral Q breakfast Blenda Nicely, [facility]      . senna-docusate (Senokot-S) tablet 1 tablet  1 tablet Oral  QHS PRN Donzetta Starch, NP      . tiotropium (SPIRIVA) inhalation capsule 18 mcg  18 mcg Inhalation Daily Donzetta Starch, NP   18 mcg at 10/09/17 7829     Discharge Medications: Please see discharge summary for a list of discharge medications.  Relevant Imaging Results:  Relevant Lab Results:   Additional Information SS#: [id number]  Geralynn Ochs, LCSW

## 2017-10-11 ENCOUNTER — Other Ambulatory Visit (HOSPITAL_COMMUNITY): Payer: Self-pay | Admitting: Interventional Radiology

## 2017-10-11 DIAGNOSIS — I729 Aneurysm of unspecified site: Secondary | ICD-10-CM

## 2017-10-11 NOTE — Progress Notes (Addendum)
Late entry for missed modified Rankin Scoring.  Based on review of the medical record and the evaluation of PT and OT.    10/08/17 2000  Modified Rankin (Stroke Patients Only)  Pre-Morbid Rankin Score 3  Modified Rankin [place], [place]  [phone] 10/11/2017

## 2017-10-14 DIAGNOSIS — F418 Other specified anxiety disorders: Secondary | ICD-10-CM | POA: Diagnosis not present

## 2017-10-14 DIAGNOSIS — I10 Essential (primary) hypertension: Secondary | ICD-10-CM | POA: Diagnosis not present

## 2017-10-14 DIAGNOSIS — J449 Chronic obstructive pulmonary disease, unspecified: Secondary | ICD-10-CM | POA: Diagnosis not present

## 2017-10-14 DIAGNOSIS — K219 Gastro-esophageal reflux disease without esophagitis: Secondary | ICD-10-CM | POA: Diagnosis not present

## 2017-10-14 DIAGNOSIS — G459 Transient cerebral ischemic attack, unspecified: Secondary | ICD-10-CM | POA: Diagnosis not present

## 2017-10-14 DIAGNOSIS — E785 Hyperlipidemia, unspecified: Secondary | ICD-10-CM | POA: Diagnosis not present

## 2017-10-16 ENCOUNTER — Other Ambulatory Visit: Payer: Self-pay

## 2017-10-16 NOTE — Patient Outreach (Signed)
[facility]) Care Management  10/16/2017  Yuna Pizzolato [birth year] [id number]  Case Closure: Per protocol RN CM has closed Complex Case Management Case for the above patient related to patient being hospitalized/SNF for greather than 10 days. Spoke with [employer], [facility] CSW who continues to follow patient during her placement in the skilled facility. CSW will notify RN CM upon patients discharge back into the community and new referral will be placed. RN CM will re-establish care with patient at that time.  Valentine Kuechle E. Rollene Rotunda RN, BSN [facility] Care Management Coordinator [phone]

## 2017-10-17 ENCOUNTER — Ambulatory Visit: Payer: Medicare HMO | Admitting: Family Medicine

## 2017-10-18 ENCOUNTER — Encounter: Payer: Self-pay | Admitting: Unknown Physician Specialty

## 2017-10-18 DIAGNOSIS — I10 Essential (primary) hypertension: Secondary | ICD-10-CM | POA: Diagnosis not present

## 2017-10-18 DIAGNOSIS — J449 Chronic obstructive pulmonary disease, unspecified: Secondary | ICD-10-CM | POA: Diagnosis not present

## 2017-10-18 DIAGNOSIS — F418 Other specified anxiety disorders: Secondary | ICD-10-CM | POA: Diagnosis not present

## 2017-10-18 DIAGNOSIS — E785 Hyperlipidemia, unspecified: Secondary | ICD-10-CM | POA: Diagnosis not present

## 2017-10-18 DIAGNOSIS — G459 Transient cerebral ischemic attack, unspecified: Secondary | ICD-10-CM | POA: Diagnosis not present

## 2017-10-18 DIAGNOSIS — K219 Gastro-esophageal reflux disease without esophagitis: Secondary | ICD-10-CM | POA: Diagnosis not present

## 2017-10-18 NOTE — Addendum Note (Signed)
Addended by: [name] on: 10/18/2017 11:36 AM   Modules accepted: Level of Service

## 2017-10-19 ENCOUNTER — Ambulatory Visit (HOSPITAL_COMMUNITY): Payer: Medicare HMO

## 2017-10-19 ENCOUNTER — Other Ambulatory Visit: Payer: Self-pay | Admitting: *Deleted

## 2017-10-19 NOTE — Patient Outreach (Signed)
[facility]) Care Management  10/19/2017  Katherine [name] 29-Mar-1940 [id number]   Post Acute Consult.  This Education officer, museum met with patient while in rehb at [employer]. Patient was in bed resting when this social worker arrived. Per patient, she understands the need to actively participate in rehab to get stronger. Patient states that she does not plan on leaving the facility prematurely as she has done in the past. Per patient, she husband is her main caregiver "He does everything for me". Per patient, she does hae adult children who provide minimal assistance.  This Education officer, museum spoke with discharge planner Donnalee Curry, who states that there has been no discharge date set yet, however patient will be returning home at discharge. Per Janett Billow, patient is working with PT however can be resistant at times.  Plan: This social worker to continue to follow patient's progress in rehab to assist with discharge planning.   Sheralyn Boatman [facility] Care Management [phone]

## 2017-10-22 ENCOUNTER — Other Ambulatory Visit: Payer: Self-pay | Admitting: Physician Assistant

## 2017-10-22 ENCOUNTER — Other Ambulatory Visit: Payer: Self-pay | Admitting: Family Medicine

## 2017-10-23 ENCOUNTER — Encounter: Payer: Self-pay | Admitting: Emergency Medicine

## 2017-10-23 ENCOUNTER — Emergency Department: Payer: Medicare HMO

## 2017-10-23 ENCOUNTER — Inpatient Hospital Stay
Admission: EM | Admit: 2017-10-23 | Discharge: 2017-10-29 | DRG: 871 | Disposition: A | Discharge status: Skilled Nursing Facility | Payer: Medicare HMO | Attending: Internal Medicine | Admitting: Internal Medicine

## 2017-10-23 ENCOUNTER — Other Ambulatory Visit: Payer: Self-pay

## 2017-10-23 DIAGNOSIS — Z96651 Presence of right artificial knee joint: Secondary | ICD-10-CM | POA: Diagnosis present

## 2017-10-23 DIAGNOSIS — R918 Other nonspecific abnormal finding of lung field: Secondary | ICD-10-CM | POA: Diagnosis not present

## 2017-10-23 DIAGNOSIS — R4701 Aphasia: Secondary | ICD-10-CM | POA: Diagnosis present

## 2017-10-23 DIAGNOSIS — R0602 Shortness of breath: Secondary | ICD-10-CM

## 2017-10-23 DIAGNOSIS — F329 Major depressive disorder, single episode, unspecified: Secondary | ICD-10-CM | POA: Diagnosis present

## 2017-10-23 DIAGNOSIS — G4733 Obstructive sleep apnea (adult) (pediatric): Secondary | ICD-10-CM | POA: Diagnosis present

## 2017-10-23 DIAGNOSIS — J440 Chronic obstructive pulmonary disease with acute lower respiratory infection: Secondary | ICD-10-CM | POA: Diagnosis present

## 2017-10-23 DIAGNOSIS — R609 Edema, unspecified: Secondary | ICD-10-CM

## 2017-10-23 DIAGNOSIS — E7849 Other hyperlipidemia: Secondary | ICD-10-CM | POA: Diagnosis not present

## 2017-10-23 DIAGNOSIS — K219 Gastro-esophageal reflux disease without esophagitis: Secondary | ICD-10-CM | POA: Diagnosis present

## 2017-10-23 DIAGNOSIS — Z87891 Personal history of nicotine dependence: Secondary | ICD-10-CM

## 2017-10-23 DIAGNOSIS — Z6841 Body Mass Index (BMI) 40.0 and over, adult: Secondary | ICD-10-CM | POA: Diagnosis not present

## 2017-10-23 DIAGNOSIS — N183 Chronic kidney disease, stage 3 (moderate): Secondary | ICD-10-CM | POA: Diagnosis present

## 2017-10-23 DIAGNOSIS — Z7952 Long term (current) use of systemic steroids: Secondary | ICD-10-CM

## 2017-10-23 DIAGNOSIS — G9341 Metabolic encephalopathy: Secondary | ICD-10-CM | POA: Diagnosis not present

## 2017-10-23 DIAGNOSIS — J9621 Acute and chronic respiratory failure with hypoxia: Secondary | ICD-10-CM | POA: Diagnosis not present

## 2017-10-23 DIAGNOSIS — J189 Pneumonia, unspecified organism: Secondary | ICD-10-CM | POA: Diagnosis present

## 2017-10-23 DIAGNOSIS — Z23 Encounter for immunization: Secondary | ICD-10-CM | POA: Diagnosis present

## 2017-10-23 DIAGNOSIS — J9622 Acute and chronic respiratory failure with hypercapnia: Secondary | ICD-10-CM | POA: Diagnosis not present

## 2017-10-23 DIAGNOSIS — R4182 Altered mental status, unspecified: Secondary | ICD-10-CM | POA: Diagnosis not present

## 2017-10-23 DIAGNOSIS — I129 Hypertensive chronic kidney disease with stage 1 through stage 4 chronic kidney disease, or unspecified chronic kidney disease: Secondary | ICD-10-CM | POA: Diagnosis present

## 2017-10-23 DIAGNOSIS — I509 Heart failure, unspecified: Secondary | ICD-10-CM

## 2017-10-23 DIAGNOSIS — R269 Unspecified abnormalities of gait and mobility: Secondary | ICD-10-CM | POA: Diagnosis present

## 2017-10-23 DIAGNOSIS — R29810 Facial weakness: Secondary | ICD-10-CM | POA: Diagnosis not present

## 2017-10-23 DIAGNOSIS — Z79899 Other long term (current) drug therapy: Secondary | ICD-10-CM

## 2017-10-23 DIAGNOSIS — N179 Acute kidney failure, unspecified: Secondary | ICD-10-CM | POA: Diagnosis present

## 2017-10-23 DIAGNOSIS — J449 Chronic obstructive pulmonary disease, unspecified: Secondary | ICD-10-CM | POA: Diagnosis present

## 2017-10-23 DIAGNOSIS — Y95 Nosocomial condition: Secondary | ICD-10-CM | POA: Diagnosis present

## 2017-10-23 DIAGNOSIS — R42 Dizziness and giddiness: Secondary | ICD-10-CM | POA: Diagnosis not present

## 2017-10-23 DIAGNOSIS — Z66 Do not resuscitate: Secondary | ICD-10-CM | POA: Diagnosis present

## 2017-10-23 DIAGNOSIS — J441 Chronic obstructive pulmonary disease with (acute) exacerbation: Secondary | ICD-10-CM | POA: Diagnosis not present

## 2017-10-23 DIAGNOSIS — R569 Unspecified convulsions: Secondary | ICD-10-CM | POA: Diagnosis not present

## 2017-10-23 DIAGNOSIS — F419 Anxiety disorder, unspecified: Secondary | ICD-10-CM | POA: Diagnosis present

## 2017-10-23 DIAGNOSIS — E875 Hyperkalemia: Secondary | ICD-10-CM | POA: Diagnosis not present

## 2017-10-23 DIAGNOSIS — E1159 Type 2 diabetes mellitus with other circulatory complications: Secondary | ICD-10-CM | POA: Diagnosis present

## 2017-10-23 DIAGNOSIS — E1122 Type 2 diabetes mellitus with diabetic chronic kidney disease: Secondary | ICD-10-CM | POA: Diagnosis present

## 2017-10-23 DIAGNOSIS — R600 Localized edema: Secondary | ICD-10-CM | POA: Diagnosis not present

## 2017-10-23 DIAGNOSIS — Z8673 Personal history of transient ischemic attack (TIA), and cerebral infarction without residual deficits: Secondary | ICD-10-CM

## 2017-10-23 DIAGNOSIS — L8990 Pressure ulcer of unspecified site, unspecified stage: Secondary | ICD-10-CM

## 2017-10-23 DIAGNOSIS — I10 Essential (primary) hypertension: Secondary | ICD-10-CM | POA: Diagnosis not present

## 2017-10-23 DIAGNOSIS — Z888 Allergy status to other drugs, medicaments and biological substances status: Secondary | ICD-10-CM

## 2017-10-23 DIAGNOSIS — Z7401 Bed confinement status: Secondary | ICD-10-CM | POA: Diagnosis not present

## 2017-10-23 DIAGNOSIS — N189 Chronic kidney disease, unspecified: Secondary | ICD-10-CM

## 2017-10-23 DIAGNOSIS — I2510 Atherosclerotic heart disease of native coronary artery without angina pectoris: Secondary | ICD-10-CM | POA: Diagnosis present

## 2017-10-23 DIAGNOSIS — Z91030 Bee allergy status: Secondary | ICD-10-CM

## 2017-10-23 DIAGNOSIS — A419 Sepsis, unspecified organism: Secondary | ICD-10-CM | POA: Diagnosis present

## 2017-10-23 DIAGNOSIS — E1121 Type 2 diabetes mellitus with diabetic nephropathy: Secondary | ICD-10-CM | POA: Diagnosis not present

## 2017-10-23 DIAGNOSIS — J9811 Atelectasis: Secondary | ICD-10-CM | POA: Diagnosis not present

## 2017-10-23 DIAGNOSIS — M6281 Muscle weakness (generalized): Secondary | ICD-10-CM | POA: Diagnosis not present

## 2017-10-23 DIAGNOSIS — Z7902 Long term (current) use of antithrombotics/antiplatelets: Secondary | ICD-10-CM

## 2017-10-23 DIAGNOSIS — R6520 Severe sepsis without septic shock: Secondary | ICD-10-CM | POA: Diagnosis present

## 2017-10-23 LAB — LACTIC ACID, PLASMA: Lactic Acid, Venous: 2.4 mmol/L (ref 0.5–1.9)

## 2017-10-23 LAB — COMPREHENSIVE METABOLIC PANEL
ALBUMIN: 2.9 g/dL — AB (ref 3.5–5.0)
ALT: 15 U/L (ref 0–44)
AST: 17 U/L (ref 15–41)
Alkaline Phosphatase: 60 U/L (ref 38–126)
Anion gap: 8 (ref 5–15)
BILIRUBIN TOTAL: 0.4 mg/dL (ref 0.3–1.2)
BUN: 24 mg/dL — AB (ref 8–23)
CHLORIDE: 102 mmol/L (ref 98–111)
CO2: 28 mmol/L (ref 22–32)
CREATININE: 1.9 mg/dL — AB (ref 0.44–1.00)
Calcium: 8.6 mg/dL — ABNORMAL LOW (ref 8.9–10.3)
GFR calc Af Amer: 28 mL/min — ABNORMAL LOW (ref >60)
GFR calc non Af Amer: 25 mL/min — ABNORMAL LOW (ref >60)
GLUCOSE: 218 mg/dL — AB (ref 70–99)
POTASSIUM: 4.5 mmol/L (ref 3.5–5.1)
Sodium: 138 mmol/L (ref 135–145)
TOTAL PROTEIN: 6.3 g/dL — AB (ref 6.5–8.1)

## 2017-10-23 LAB — LIPASE, BLOOD: LIPASE: 33 U/L (ref 11–51)

## 2017-10-23 LAB — CBC WITH DIFFERENTIAL/PLATELET
Basophils Absolute: 0 10*3/uL (ref 0–0.1)
Basophils Relative: 1 %
Eosinophils Absolute: 0 10*3/uL (ref 0–0.7)
Eosinophils Relative: 1 %
HCT: 32 % — ABNORMAL LOW (ref 35.0–47.0)
Hemoglobin: 10.3 g/dL — ABNORMAL LOW (ref 12.0–16.0)
LYMPHS ABS: 0.7 10*3/uL — AB (ref 1.0–3.6)
LYMPHS PCT: 14 %
MCH: 28.4 pg (ref 26.0–34.0)
MCHC: 32.2 g/dL (ref 32.0–36.0)
MCV: 88.3 fL (ref 80.0–100.0)
MONOS PCT: 9 %
Monocytes Absolute: 0.4 10*3/uL (ref 0.2–0.9)
NEUTROS PCT: 75 %
Neutro Abs: 3.9 10*3/uL (ref 1.4–6.5)
Platelets: 135 10*3/uL — ABNORMAL LOW (ref 150–440)
RBC: 3.62 MIL/uL — ABNORMAL LOW (ref 3.80–5.20)
RDW: 20.8 % — ABNORMAL HIGH (ref 11.5–14.5)
WBC: 5.2 10*3/uL (ref 3.6–11.0)

## 2017-10-23 LAB — BRAIN NATRIURETIC PEPTIDE: B Natriuretic Peptide: 104 pg/mL — ABNORMAL HIGH (ref 0.0–100.0)

## 2017-10-23 LAB — TROPONIN I: Troponin I: <0.03 ng/mL (ref <0.03)

## 2017-10-23 MED ORDER — ACETAMINOPHEN 500 MG PO TABS
ORAL_TABLET | ORAL | Status: AC
  Administered 2017-10-23: 1000 mg via ORAL
  Filled 2017-10-23: qty 2

## 2017-10-23 MED ORDER — METOCLOPRAMIDE HCL 5 MG/ML IJ SOLN
10.0000 mg | Freq: Once | INTRAMUSCULAR | Status: AC
  Administered 2017-10-24: 10 mg via INTRAVENOUS
  Filled 2017-10-23: qty 2

## 2017-10-23 MED ORDER — SODIUM CHLORIDE 0.9 % IV BOLUS
1000.0000 mL | Freq: Once | INTRAVENOUS | Status: AC
  Administered 2017-10-23: 1000 mL via INTRAVENOUS

## 2017-10-23 MED ORDER — ACETAMINOPHEN 500 MG PO TABS
1000.0000 mg | ORAL_TABLET | Freq: Once | ORAL | Status: AC
  Administered 2017-10-23: 1000 mg via ORAL

## 2017-10-23 NOTE — ED Triage Notes (Signed)
Pt presents to ED via [facility] from [facility] in [place] c/o SOB and fever. EMS report T102.2, P104, and O2 sat 87% on chronic 2L. Pt has had multiple admissions in past 6 mos for COPD/PNA.

## 2017-10-23 NOTE — ED Notes (Signed)
Pt went to CT

## 2017-10-23 NOTE — ED Notes (Signed)
Pt returned from CT and X-Ray.

## 2017-10-23 NOTE — ED Provider Notes (Signed)
[facility] Emergency Department Provider Note ____________________________________________   First MD Initiated Contact with Patient 10/23/17 2259     (approximate)  I have reviewed the triage vital signs and the nursing notes.   HISTORY  Chief Complaint Shortness of Breath    HPI Katherine [name] is a 77 y.o. female with PMH as noted below including COPD and recent admission with concern for TIA s/p tPA who presents with a primary complaint of vomiting (not shortness of breath), multiple episodes over approximately the last 10 hours, nonbloody, and associated with frontal headache, fever, and generalized weakness.  She denies any significant increase in shortness of breath. She denies abdominal pain and states her last BM was yesterday.    Past Medical History:  Diagnosis Date  . Angioedema   . Anxiety   . Anxiety and depression   . Chronic constipation   . Chronic kidney disease    stage 3  . COPD (chronic obstructive pulmonary disease) ([place])   . Gallstones   . GERD (gastroesophageal reflux disease)   . Hypertension   . Left ventricular hypertrophy   . Osteoarthritis   . Prurigo nodularis   . Tobacco abuse   . Vitamin D deficiency disease     Patient Active Problem List   Diagnosis Date Noted  . Possible Seizures ([place]) 10/08/2017  . Hyperlipemia 10/08/2017  . 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 Stroke-like episode ([place]) s/p IV tpa 10/04/2017  . Palliative care by specialist   . CAP (community acquired pneumonia) 09/11/2017  . Elevated rheumatoid factor 09/05/2017  . COPD exacerbation ([place]) 08/27/2017  . Frequent hospital admissions 08/22/2017  . Aphasia 06/28/2017  . Acute on chronic respiratory failure with hypoxia ([place]) 06/12/2017  . Hand pain 03/21/2017  . Dry skin 03/21/2017  . Pain in finger of left hand 09/12/2016  . Chronic fatigue 06/12/2016  . Left knee pain 06/12/2016  . Goals of care,  counseling/discussion 03/13/2016  . Primary localized osteoarthritis of right knee 02/08/2016  . OSA and COPD overlap syndrome ([place]) 09/16/2015  . Rotator cuff syndrome 09/07/2015  . 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 Sleep disturbance 04/14/2015  . Coronary artery disease 03/14/2015  . 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 Anxiety state   . Tobacco abuse   . Prurigo nodularis   . GERD (gastroesophageal reflux disease)   . COPD (chronic obstructive pulmonary disease) ([place])   . Osteoarthritis   . Vitamin D deficiency disease   . Chronic constipation   . CKD (chronic kidney disease), stage III ([place])   . LVH (left ventricular hypertrophy) 05/20/2013  . Essential hypertension 05/20/2013  . Morbid obesity ([place]) 05/20/2013    Past Surgical History:  Procedure Laterality Date  . CHOLECYSTECTOMY    . 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   Procedure: TOTAL KNEE ARTHROPLASTY;  Surgeon: Hessie Knows, MD;  Location: [facility] ORS;  Service: Orthopedics;  Laterality: Right;    Prior to Admission medications   Medication Sig Start Date End Date Taking? Authorizing Provider  acetaminophen (TYLENOL) 650 MG CR tablet Take 650-1,300 mg by mouth every 8 (eight) hours as needed for pain.   Yes [provider]  albuterol (ACCUNEB) 0.63 MG/3ML nebulizer solution Take 3 mLs by nebulization 3 (three) times daily as needed for wheezing.   Yes [provider]  albuterol (PROVENTIL HFA;VENTOLIN HFA) 108 (90 Base) MCG/ACT inhaler Inhale 2  puffs into the lungs every 4 (four) hours as needed for wheezing or shortness of breath.   Yes [provider]  ALPRAZolam Duanne Moron) 0.5 MG tablet Take 0.5 mg by mouth 2 (two) times daily as needed for anxiety.   Yes [provider]  atorvastatin (LIPITOR) 80 MG tablet Take 80 mg by mouth daily.   Yes [provider]  cetirizine (ZYRTEC) 10 MG tablet Take 5 mg  by mouth at bedtime.   Yes [provider]  cholecalciferol (VITAMIN D) 1000 units tablet Take 1,000 Units by mouth daily.   Yes [provider]  clopidogrel (PLAVIX) 75 MG tablet Take 75 mg by mouth daily.   Yes [provider]  diltiazem (CARDIZEM SR) 120 MG 12 hr capsule Take 120 mg by mouth 2 (two) times daily.   Yes [provider]  docusate sodium (COLACE) 100 MG capsule Take 100 mg by mouth daily.   Yes [provider]  FLUoxetine (PROZAC) 10 MG capsule Take 10 mg by mouth daily.   Yes [provider]  Fluticasone-Umeclidin-Vilant 100-62.5-25 MCG/INH AEPB Inhale 1 puff into the lungs daily.   Yes [provider]  gabapentin (NEURONTIN) 300 MG capsule Take 300 mg by mouth at bedtime.   Yes [provider]  hydrALAZINE (APRESOLINE) 25 MG tablet Take 25 mg by mouth 3 (three) times daily.   Yes [provider]  ipratropium (ATROVENT) 0.03 % nasal spray Place 2 sprays into both nostrils every 12 (twelve) hours.   Yes [provider]  ipratropium-albuterol (DUONEB) 0.5-2.5 (3) MG/3ML SOLN Take 3 mLs by nebulization every 6 (six) hours as needed (shortness of breath).   Yes [provider]  levETIRAcetam (KEPPRA XR) 500 MG 24 hr tablet Take 2 tablets (1,000 mg total) by mouth daily. 10/08/17  Yes Rinehuls, Early Chars, PA-C  linagliptin (TRADJENTA) 5 MG TABS tablet Take 1 tablet (5 mg total) by mouth daily. 10/10/17  Yes Donzetta Starch, NP  meclizine (ANTIVERT) 25 MG tablet Take 25 mg by mouth 2 (two) times daily as needed for dizziness.   Yes [provider]  methocarbamol (ROBAXIN) 500 MG tablet Take 500 mg by mouth every 8 (eight) hours as needed for muscle spasms.   Yes [provider]  pantoprazole (PROTONIX) 40 MG tablet Take 40 mg by mouth daily.   Yes [provider]  tiotropium (SPIRIVA) 18 MCG inhalation capsule Place 18 mcg into inhaler and inhale daily.   Yes [provider]  predniSONE (DELTASONE) 10 MG tablet Take 4 tablets (40 mg total) by mouth daily with breakfast. Decrease by 10 mg every third day. First 40 mg dose given in hospital 10/08/2017. Next dose due 10/09/2017 Patient not taking: Reported on 10/23/2017 10/08/17   Donzetta Starch, NP  predniSONE (DELTASONE) 10 MG tablet Take 1 tablet (10 mg total) by mouth daily with breakfast. Patient not taking: Reported on 10/23/2017 10/17/17   Donzetta Starch, NP  predniSONE (DELTASONE) 10 MG tablet Take 3 tablets (30 mg total) by mouth daily with breakfast. Patient not taking: Reported on 10/23/2017 10/11/17   Donzetta Starch, NP  predniSONE (DELTASONE) 20 MG tablet Take 1 tablet (20 mg total) by mouth daily with breakfast. Patient not taking: Reported on 10/23/2017 10/14/17   Donzetta Starch, NP    Allergies Bee venom and Enalapril maleate  Family History  Problem Relation Age of Onset  . Alcohol abuse Mother   . Sickle cell anemia Daughter   .  Hypertension Son   . Cancer Neg Hx   . COPD Neg Hx   . Diabetes Neg Hx   . Heart disease Neg Hx   . Stroke Neg Hx     Social History Social History   Tobacco Use  . Smoking status: Former Smoker    Packs/day: 0.50    Years: 60.00    Pack years: 30.00    Types: Cigarettes  . Smokeless tobacco: Never Used  Substance Use Topics  . Alcohol use: No    Alcohol/week: 0.0 standard drinks    Comment: rare  . Drug use: No    Review of Systems  Constitutional: Positive for fever.  Eyes: No visual changes. Positive for discharge.  ENT: No sore throat. Cardiovascular: Denies chest pain. Respiratory: Denies acute shortness of breath. Gastrointestinal: Positive for nausea and vomiting.  No diarrhea.  Genitourinary: Negative for dysuria.  Musculoskeletal: Negative for back pain. Skin: Negative for rash. Neurological: Positive for headache.  ____________________________________________   PHYSICAL EXAM:  VITAL SIGNS: ED Triage Vitals  Enc Vitals  Group     BP 10/23/17 2307 117/80     Pulse Rate 10/23/17 2307 95     Resp 10/23/17 2307 (!) 26     Temp 10/23/17 2307 (!) 102.2 F (39 C)     Temp Source 10/23/17 2307 Oral     SpO2 10/23/17 2307 (!) 87 %     Weight 10/23/17 2308 269 lb 14.4 oz (122.4 kg)     Height 10/23/17 2308 5\' 2"  (1.575 m)     Head Circumference --      [facility] Flow --      Pain Score 10/23/17 2308 8     Pain Loc --      Pain Edu? --      Excl. in [place]? --     Constitutional: Alert and oriented. Uncomfortable appearing but in no acute distress. Eyes: Bilateral cloudy discharge with no significant injection. PERRLA.  Head: Atraumatic. Nose: No congestion/rhinnorhea. Mouth/Throat: Mucous membranes are dry.   Neck: Normal range of motion.  Cardiovascular: Normal rate, regular rhythm. Grossly normal heart sounds.  Good peripheral circulation. Respiratory: Slightly increased respiratory effort.  No retractions. Lungs CTAB. Gastrointestinal: Soft and nontender. No distention.  Genitourinary: No flank tenderness. Musculoskeletal: Extremities warm and well perfused.  Neurologic:  Normal speech and language. Motor intact in all extremities. No gross focal neurologic deficits are appreciated.  Skin:  Skin is warm and dry. No rash noted. Psychiatric: Speech and behavior are normal.  ____________________________________________   LABS (all labs ordered are listed, but only abnormal results are displayed)  Labs Reviewed  LACTIC ACID, PLASMA - Abnormal; Notable for the following components:      Result Value   Lactic Acid, Venous 2.4 (*)    All other components within normal limits  COMPREHENSIVE METABOLIC PANEL - Abnormal; Notable for the following components:   Glucose, Bld 218 (*)    BUN 24 (*)    Creatinine, Ser 1.90 (*)    Calcium 8.6 (*)    Total Protein 6.3 (*)    Albumin 2.9 (*)    GFR calc non Af Amer 25 (*)    GFR calc Af Amer 28 (*)    All other components within normal limits  BRAIN NATRIURETIC  PEPTIDE - Abnormal; Notable for the following components:   B Natriuretic Peptide 104.0 (*)    All other components within normal limits  CBC WITH DIFFERENTIAL/PLATELET - Abnormal; Notable for the following  components:   RBC 3.62 (*)    Hemoglobin 10.3 (*)    HCT 32.0 (*)    RDW 20.8 (*)    Platelets 135 (*)    Lymphs Abs 0.7 (*)    All other components within normal limits  CULTURE, BLOOD (ROUTINE X 2)  CULTURE, BLOOD (ROUTINE X 2)  LIPASE, BLOOD  TROPONIN I  LACTIC ACID, PLASMA  BLOOD GAS, VENOUS  URINALYSIS, COMPLETE (UACMP) WITH MICROSCOPIC   ____________________________________________  EKG  ED ECG REPORT I, Arta Silence, the attending physician, personally viewed and interpreted this ECG.  Date: 10/23/2017 EKG Time: 22:33 Rate: 97 Rhythm: normal sinus rhythm QRS Axis: left axis Intervals: normal ST/T Wave abnormalities: normal Narrative Interpretation: no evidence of acute ischemia ____________________________________________  RADIOLOGY  CXR: Bilateral lower lobe patchy opacities  CT head: No ICH  ____________________________________________   PROCEDURES  Procedure(s) performed: No  Procedures  Critical Care performed: No ____________________________________________   INITIAL IMPRESSION / ASSESSMENT AND PLAN / ED COURSE  Pertinent labs & imaging results that were available during my care of the patient were reviewed by me and considered in my medical decision making (see chart for details).  77 year old female with PMH as noted above presents from [facility] with vomiting, fever, generalized weakness and headache since this afternoon. She denies any specific increase in shortness of breath.   I reviewed the past medical records in Epic; the patient was admitted here last month with COPD exacerbation and respiratory failure requiring BiPAP.  When in the ICU she developed apparent aphasia, was given tPA and transferred to [facility]. She also  developed hemoptysis after the tPA.  She was ruled out for stroke or ICH.  On exam the patient is somewhat weak and chronically ill appearing with borderline low O2 sat on her normal 3L [place] but in no respiratory distress.  Abdomen is soft and nontender.  Neuro exam is nonfocal.    Overall given the fever I suspect infectious cause, especially UTI, pneumonia, or gastroenteritis.  With the acute vomiting and headache, given her recent tPA treatment and the fact she is on plavix she is at elevated risk for ICH although this is lower on the differential.  We will obtain CT head, CXR, infection/sepsis workup, give fluids, and reassess.  At this time given the abdominal exam I have low suspicion for intra-abdominal source but will consider CT abdomen if workup shows no other obvious source of fever.   ----------------------------------------- 12:07 AM on 10/24/2017 -----------------------------------------  Chest X-ray consistent with pneumonia.  Lactate is elevated.  CT head is negative for acute findings.  Fluids have been given.  Antibiotics ordered for HCAP.  Patient is stable for admission.  I signed the patient out to the hospitalist, Dr. Jannifer Franklin.   ____________________________________________   FINAL CLINICAL IMPRESSION(S) / ED DIAGNOSES  Final diagnoses:  HCAP (healthcare-associated pneumonia)      NEW MEDICATIONS STARTED DURING THIS VISIT:  New Prescriptions   No medications on file     Note:  This document was prepared using Dragon voice recognition software and may include unintentional dictation errors.   Arta Silence, MD 10/24/17 [phone]

## 2017-10-24 ENCOUNTER — Other Ambulatory Visit: Payer: Self-pay

## 2017-10-24 DIAGNOSIS — J189 Pneumonia, unspecified organism: Secondary | ICD-10-CM | POA: Diagnosis present

## 2017-10-24 DIAGNOSIS — E1122 Type 2 diabetes mellitus with diabetic chronic kidney disease: Secondary | ICD-10-CM | POA: Diagnosis present

## 2017-10-24 DIAGNOSIS — A419 Sepsis, unspecified organism: Secondary | ICD-10-CM | POA: Diagnosis present

## 2017-10-24 DIAGNOSIS — N189 Chronic kidney disease, unspecified: Secondary | ICD-10-CM

## 2017-10-24 DIAGNOSIS — Z23 Encounter for immunization: Secondary | ICD-10-CM | POA: Diagnosis present

## 2017-10-24 DIAGNOSIS — R569 Unspecified convulsions: Secondary | ICD-10-CM | POA: Diagnosis not present

## 2017-10-24 DIAGNOSIS — R6520 Severe sepsis without septic shock: Secondary | ICD-10-CM | POA: Diagnosis present

## 2017-10-24 DIAGNOSIS — Z87891 Personal history of nicotine dependence: Secondary | ICD-10-CM | POA: Diagnosis not present

## 2017-10-24 DIAGNOSIS — J440 Chronic obstructive pulmonary disease with acute lower respiratory infection: Secondary | ICD-10-CM | POA: Diagnosis present

## 2017-10-24 DIAGNOSIS — G4733 Obstructive sleep apnea (adult) (pediatric): Secondary | ICD-10-CM | POA: Diagnosis present

## 2017-10-24 DIAGNOSIS — R29810 Facial weakness: Secondary | ICD-10-CM | POA: Diagnosis not present

## 2017-10-24 DIAGNOSIS — G9341 Metabolic encephalopathy: Secondary | ICD-10-CM | POA: Diagnosis not present

## 2017-10-24 DIAGNOSIS — N183 Chronic kidney disease, stage 3 (moderate): Secondary | ICD-10-CM | POA: Diagnosis present

## 2017-10-24 DIAGNOSIS — J449 Chronic obstructive pulmonary disease, unspecified: Secondary | ICD-10-CM | POA: Diagnosis not present

## 2017-10-24 DIAGNOSIS — N179 Acute kidney failure, unspecified: Secondary | ICD-10-CM | POA: Diagnosis present

## 2017-10-24 DIAGNOSIS — F329 Major depressive disorder, single episode, unspecified: Secondary | ICD-10-CM | POA: Diagnosis present

## 2017-10-24 DIAGNOSIS — J9621 Acute and chronic respiratory failure with hypoxia: Secondary | ICD-10-CM | POA: Diagnosis not present

## 2017-10-24 DIAGNOSIS — J441 Chronic obstructive pulmonary disease with (acute) exacerbation: Secondary | ICD-10-CM | POA: Diagnosis not present

## 2017-10-24 DIAGNOSIS — Z6841 Body Mass Index (BMI) 40.0 and over, adult: Secondary | ICD-10-CM | POA: Diagnosis not present

## 2017-10-24 DIAGNOSIS — R269 Unspecified abnormalities of gait and mobility: Secondary | ICD-10-CM | POA: Diagnosis present

## 2017-10-24 DIAGNOSIS — F419 Anxiety disorder, unspecified: Secondary | ICD-10-CM | POA: Diagnosis present

## 2017-10-24 DIAGNOSIS — Y95 Nosocomial condition: Secondary | ICD-10-CM | POA: Diagnosis present

## 2017-10-24 DIAGNOSIS — R4701 Aphasia: Secondary | ICD-10-CM | POA: Diagnosis present

## 2017-10-24 DIAGNOSIS — I2510 Atherosclerotic heart disease of native coronary artery without angina pectoris: Secondary | ICD-10-CM | POA: Diagnosis present

## 2017-10-24 DIAGNOSIS — J9622 Acute and chronic respiratory failure with hypercapnia: Secondary | ICD-10-CM | POA: Diagnosis not present

## 2017-10-24 DIAGNOSIS — E875 Hyperkalemia: Secondary | ICD-10-CM | POA: Diagnosis not present

## 2017-10-24 DIAGNOSIS — K219 Gastro-esophageal reflux disease without esophagitis: Secondary | ICD-10-CM | POA: Diagnosis present

## 2017-10-24 HISTORY — DX: Sepsis, unspecified organism: A41.9

## 2017-10-24 HISTORY — DX: Acute kidney failure, unspecified: N17.9

## 2017-10-24 LAB — URINALYSIS, COMPLETE (UACMP) WITH MICROSCOPIC
BILIRUBIN URINE: NEGATIVE
Glucose, UA: NEGATIVE mg/dL
Hgb urine dipstick: NEGATIVE
KETONES UR: NEGATIVE mg/dL
Nitrite: NEGATIVE
PH: 5 (ref 5.0–8.0)
Protein, ur: NEGATIVE mg/dL
Specific Gravity, Urine: 1.008 (ref 1.005–1.030)
WBC, UA: >50 WBC/hpf — ABNORMAL HIGH (ref 0–5)

## 2017-10-24 LAB — LACTIC ACID, PLASMA: Lactic Acid, Venous: 1 mmol/L (ref 0.5–1.9)

## 2017-10-24 LAB — BLOOD GAS, VENOUS
Acid-Base Excess: 0.3 mmol/L (ref 0.0–2.0)
Bicarbonate: 27.4 mmol/L (ref 20.0–28.0)
O2 Saturation: 72 %
PCO2 VEN: 52 mmHg (ref 44.0–60.0)
PH VEN: 7.33 (ref 7.250–7.430)
Patient temperature: 37
pO2, Ven: 41 mmHg (ref 32.0–45.0)

## 2017-10-24 LAB — GLUCOSE, CAPILLARY
GLUCOSE-CAPILLARY: 137 mg/dL — AB (ref 70–99)
GLUCOSE-CAPILLARY: 161 mg/dL — AB (ref 70–99)
GLUCOSE-CAPILLARY: 134 mg/dL — AB (ref 70–99)
Glucose-Capillary: 149 mg/dL — ABNORMAL HIGH (ref 70–99)

## 2017-10-24 LAB — BASIC METABOLIC PANEL
ANION GAP: 8 (ref 5–15)
BUN: 24 mg/dL — ABNORMAL HIGH (ref 8–23)
CHLORIDE: 106 mmol/L (ref 98–111)
CO2: 26 mmol/L (ref 22–32)
Calcium: 8 mg/dL — ABNORMAL LOW (ref 8.9–10.3)
Creatinine, Ser: 1.63 mg/dL — ABNORMAL HIGH (ref 0.44–1.00)
GFR calc Af Amer: 34 mL/min — ABNORMAL LOW (ref >60)
GFR calc non Af Amer: 30 mL/min — ABNORMAL LOW (ref >60)
Glucose, Bld: 168 mg/dL — ABNORMAL HIGH (ref 70–99)
POTASSIUM: 4.4 mmol/L (ref 3.5–5.1)
Sodium: 140 mmol/L (ref 135–145)

## 2017-10-24 LAB — PROCALCITONIN: Procalcitonin: <0.1 ng/mL

## 2017-10-24 LAB — CBC
HEMATOCRIT: 28.2 % — AB (ref 35.0–47.0)
HEMOGLOBIN: 9.1 g/dL — AB (ref 12.0–16.0)
MCH: 28.4 pg (ref 26.0–34.0)
MCHC: 32.3 g/dL (ref 32.0–36.0)
MCV: 88 fL (ref 80.0–100.0)
Platelets: 111 10*3/uL — ABNORMAL LOW (ref 150–440)
RBC: 3.2 MIL/uL — AB (ref 3.80–5.20)
RDW: 21.2 % — AB (ref 11.5–14.5)
WBC: 4.6 10*3/uL (ref 3.6–11.0)

## 2017-10-24 MED ORDER — SODIUM CHLORIDE 0.9 % IV SOLN
2.0000 g | Freq: Once | INTRAVENOUS | Status: AC
  Administered 2017-10-24: 2 g via INTRAVENOUS
  Filled 2017-10-24: qty 2

## 2017-10-24 MED ORDER — HEPARIN SODIUM (PORCINE) 5000 UNIT/ML IJ SOLN
5000.0000 [IU] | Freq: Three times a day (TID) | INTRAMUSCULAR | Status: DC
  Administered 2017-10-24 – 2017-10-29 (×17): 5000 [IU] via SUBCUTANEOUS
  Filled 2017-10-24 (×17): qty 1

## 2017-10-24 MED ORDER — HYDRALAZINE HCL 50 MG PO TABS
25.0000 mg | ORAL_TABLET | Freq: Three times a day (TID) | ORAL | Status: DC
  Administered 2017-10-24 – 2017-10-29 (×13): 25 mg via ORAL
  Filled 2017-10-24 (×16): qty 1

## 2017-10-24 MED ORDER — LINAGLIPTIN 5 MG PO TABS
5.0000 mg | ORAL_TABLET | Freq: Every day | ORAL | Status: DC
  Administered 2017-10-24 – 2017-10-29 (×6): 5 mg via ORAL
  Filled 2017-10-24 (×6): qty 1

## 2017-10-24 MED ORDER — UMECLIDINIUM BROMIDE 62.5 MCG/INH IN AEPB
1.0000 | INHALATION_SPRAY | Freq: Every day | RESPIRATORY_TRACT | Status: DC
  Administered 2017-10-24 – 2017-10-29 (×6): 1 via RESPIRATORY_TRACT
  Filled 2017-10-24: qty 7

## 2017-10-24 MED ORDER — LEVETIRACETAM ER 500 MG PO TB24
1000.0000 mg | ORAL_TABLET | Freq: Every day | ORAL | Status: DC
  Administered 2017-10-24 – 2017-10-29 (×6): 1000 mg via ORAL
  Filled 2017-10-24 (×6): qty 2

## 2017-10-24 MED ORDER — ATORVASTATIN CALCIUM 20 MG PO TABS
80.0000 mg | ORAL_TABLET | Freq: Every day | ORAL | Status: DC
  Administered 2017-10-24 – 2017-10-29 (×6): 80 mg via ORAL
  Filled 2017-10-24 (×6): qty 4

## 2017-10-24 MED ORDER — TIOTROPIUM BROMIDE MONOHYDRATE 18 MCG IN CAPS
18.0000 ug | ORAL_CAPSULE | Freq: Every day | RESPIRATORY_TRACT | Status: DC
  Administered 2017-10-24: 18 ug via RESPIRATORY_TRACT
  Filled 2017-10-24: qty 5

## 2017-10-24 MED ORDER — ONDANSETRON HCL 4 MG PO TABS: 4.0000 mg | ORAL_TABLET | Freq: Four times a day (QID) | ORAL | Status: DC | PRN

## 2017-10-24 MED ORDER — IPRATROPIUM-ALBUTEROL 0.5-2.5 (3) MG/3ML IN SOLN: 3.0000 mL | Freq: Four times a day (QID) | RESPIRATORY_TRACT | Status: DC | PRN

## 2017-10-24 MED ORDER — FLUOXETINE HCL 10 MG PO CAPS
10.0000 mg | ORAL_CAPSULE | Freq: Every day | ORAL | Status: DC
  Administered 2017-10-24 – 2017-10-29 (×6): 10 mg via ORAL
  Filled 2017-10-24 (×6): qty 1

## 2017-10-24 MED ORDER — INSULIN ASPART 100 UNIT/ML ~~LOC~~ SOLN
0.0000 [IU] | Freq: Three times a day (TID) | SUBCUTANEOUS | Status: DC
  Administered 2017-10-24: 1 [IU] via SUBCUTANEOUS
  Administered 2017-10-24: 2 [IU] via SUBCUTANEOUS
  Administered 2017-10-24 – 2017-10-26 (×4): 1 [IU] via SUBCUTANEOUS
  Administered 2017-10-26: 13:00:00 3 [IU] via SUBCUTANEOUS
  Administered 2017-10-27 – 2017-10-28 (×5): 2 [IU] via SUBCUTANEOUS
  Administered 2017-10-29: 18:00:00 3 [IU] via SUBCUTANEOUS
  Administered 2017-10-29: 09:00:00 2 [IU] via SUBCUTANEOUS
  Administered 2017-10-29: 13:00:00 5 [IU] via SUBCUTANEOUS
  Filled 2017-10-24 (×16): qty 1

## 2017-10-24 MED ORDER — VANCOMYCIN HCL 10 G IV SOLR
1250.0000 mg | INTRAVENOUS | Status: DC
  Administered 2017-10-24: 1250 mg via INTRAVENOUS
  Filled 2017-10-24: qty 1250

## 2017-10-24 MED ORDER — ONDANSETRON HCL 4 MG/2ML IJ SOLN
4.0000 mg | Freq: Four times a day (QID) | INTRAMUSCULAR | Status: DC | PRN
  Administered 2017-10-26: 10:00:00 4 mg via INTRAVENOUS
  Filled 2017-10-24: qty 2

## 2017-10-24 MED ORDER — GABAPENTIN 300 MG PO CAPS
300.0000 mg | ORAL_CAPSULE | Freq: Every day | ORAL | Status: DC
  Administered 2017-10-24 – 2017-10-28 (×5): 300 mg via ORAL
  Filled 2017-10-24 (×5): qty 1

## 2017-10-24 MED ORDER — VANCOMYCIN HCL IN DEXTROSE 1-5 GM/200ML-% IV SOLN
1000.0000 mg | Freq: Once | INTRAVENOUS | Status: AC
  Administered 2017-10-24: 1000 mg via INTRAVENOUS
  Filled 2017-10-24: qty 200

## 2017-10-24 MED ORDER — PANTOPRAZOLE SODIUM 40 MG PO TBEC
40.0000 mg | DELAYED_RELEASE_TABLET | Freq: Every day | ORAL | Status: DC
  Administered 2017-10-24 – 2017-10-29 (×6): 40 mg via ORAL
  Filled 2017-10-24 (×6): qty 1

## 2017-10-24 MED ORDER — INFLUENZA VAC SPLIT HIGH-DOSE 0.5 ML IM SUSY
0.5000 mL | PREFILLED_SYRINGE | INTRAMUSCULAR | Status: AC
  Administered 2017-10-27: 0.5 mL via INTRAMUSCULAR
  Filled 2017-10-24 (×2): qty 0.5

## 2017-10-24 MED ORDER — DILTIAZEM HCL ER 60 MG PO CP12
120.0000 mg | ORAL_CAPSULE | Freq: Two times a day (BID) | ORAL | Status: DC
  Administered 2017-10-24 – 2017-10-29 (×11): 120 mg via ORAL
  Filled 2017-10-24 (×13): qty 2

## 2017-10-24 MED ORDER — CLOPIDOGREL BISULFATE 75 MG PO TABS
75.0000 mg | ORAL_TABLET | Freq: Every day | ORAL | Status: DC
  Administered 2017-10-24 – 2017-10-29 (×6): 75 mg via ORAL
  Filled 2017-10-24 (×7): qty 1

## 2017-10-24 MED ORDER — FLUTICASONE-UMECLIDIN-VILANT 100-62.5-25 MCG/INH IN AEPB: 1.0000 | INHALATION_SPRAY | Freq: Every day | RESPIRATORY_TRACT | Status: DC

## 2017-10-24 MED ORDER — FLUTICASONE FUROATE-VILANTEROL 100-25 MCG/INH IN AEPB
1.0000 | INHALATION_SPRAY | Freq: Every day | RESPIRATORY_TRACT | Status: DC
  Administered 2017-10-24 – 2017-10-29 (×6): 1 via RESPIRATORY_TRACT
  Filled 2017-10-24: qty 28

## 2017-10-24 MED ORDER — INSULIN ASPART 100 UNIT/ML ~~LOC~~ SOLN
0.0000 [IU] | Freq: Every day | SUBCUTANEOUS | Status: DC
  Administered 2017-10-28: 3 [IU] via SUBCUTANEOUS
  Filled 2017-10-24: qty 1

## 2017-10-24 MED ORDER — SODIUM CHLORIDE 0.9 % IV SOLN
INTRAVENOUS | Status: AC
  Administered 2017-10-24: 03:00:00 via INTRAVENOUS

## 2017-10-24 MED ORDER — ACETAMINOPHEN 650 MG RE SUPP: 650.0000 mg | Freq: Four times a day (QID) | RECTAL | Status: DC | PRN

## 2017-10-24 MED ORDER — ORAL CARE MOUTH RINSE
15.0000 mL | Freq: Two times a day (BID) | OROMUCOSAL | Status: DC
  Administered 2017-10-24 – 2017-10-29 (×6): 15 mL via OROMUCOSAL

## 2017-10-24 MED ORDER — SODIUM CHLORIDE 0.9 % IV SOLN
2.0000 g | Freq: Two times a day (BID) | INTRAVENOUS | Status: DC
  Administered 2017-10-24 – 2017-10-25 (×2): 2 g via INTRAVENOUS
  Filled 2017-10-24 (×4): qty 2

## 2017-10-24 MED ORDER — ALPRAZOLAM 0.5 MG PO TABS
0.5000 mg | ORAL_TABLET | Freq: Two times a day (BID) | ORAL | Status: DC | PRN
  Administered 2017-10-25: 0.5 mg via ORAL
  Filled 2017-10-24: qty 1

## 2017-10-24 MED ORDER — ACETAMINOPHEN 325 MG PO TABS
650.0000 mg | ORAL_TABLET | Freq: Four times a day (QID) | ORAL | Status: DC | PRN
  Administered 2017-10-24 – 2017-10-26 (×3): 650 mg via ORAL
  Filled 2017-10-24 (×3): qty 2

## 2017-10-24 NOTE — Clinical Social Work Note (Signed)
Clinical Social Work Assessment  Patient Details  Name: Katherine [name] MRN: [id number] Date of Birth: 1940/02/27  Date of referral:  10/24/17               Reason for consult:  Facility Placement                Permission sought to share information with:  Case Manager, Customer service manager, Family Supports Permission granted to share information::     Name::        Agency::     Relationship::     Contact Information:     Housing/Transportation Living arrangements for the past 2 months:  [place] of Information:  Patient, Spouse Patient Interpreter Needed:  None Criminal Activity/Legal Involvement Pertinent to Current Situation/Hospitalization:  No - Comment as needed Significant Relationships:  Spouse Lives with:  Facility Resident Do you feel safe going back to the place where you live?  Yes Need for family participation in patient care:  Yes (Comment)  Care giving concerns:  Patient admitted from [facility].    Social Worker assessment / plan: CSW noted in chart review that patient is from [facility]. CSW met with patient and husband Bennie Hind at bedside. Patient states that she was at [facility] for short term rehab. Per patient and husband patient was living at home with husband prior to hospital stay in August. Patient went to [facility] for rehab and plans to return there when ready for discharge. CSW spoke with Otila Kluver, [facility] liaison regarding patient. Otila Kluver states that patient can return when ready for discharge but she will need a new [employer] auth prior to admission. CSW will continue to follow for discharge planning.   Employment status:  Retired Nurse, adult PT Recommendations:  Not assessed at this time Information / Referral to community resources:     Patient/Family's Response to care:  Patient and husband thanked CSW for assistance  Patient/Family's Understanding of and Emotional Response  to Diagnosis, Current Treatment, and Prognosis:  Patient and husband understand and agree to discharge plan.   Emotional Assessment Appearance:  Appears stated age Attitude/Demeanor/Rapport:    Affect (typically observed):  Accepting, Adaptable Orientation:  Oriented to Self, Oriented to Place, Oriented to  Time Alcohol / Substance use:  Not Applicable Psych involvement (Current and /or in the community):  No (Comment)  Discharge Needs  Concerns to be addressed:  Discharge Planning Concerns Readmission within the last 30 days:  Yes Current discharge risk:  None Barriers to Discharge:  Continued Medical Work up   [employer], [place] 10/24/2017, 11:27 AM

## 2017-10-24 NOTE — Progress Notes (Signed)
Patient briefly seen and examined.  Patient here with sepsis due to pneumonia.   Continue current management.

## 2017-10-24 NOTE — H&P (Signed)
[facility] NAME: Katherine [name]    MR#:  [id number]  DATE OF BIRTH:  February 22, 1940  DATE OF ADMISSION:  10/23/2017  PRIMARY CARE PHYSICIAN: Valerie Roys, DO   REQUESTING/REFERRING PHYSICIAN: Cherylann Banas, MD  CHIEF COMPLAINT:   Chief Complaint  Patient presents with  . Shortness of Breath    HISTORY OF PRESENT ILLNESS:  Katherine [name]  is a 77 y.o. female who presents with chief complaint as above.  Patient states that over the last several days she has been getting progressively more fatigued and short of breath, with some cough.  She had fevers at home today.  She came to ED for evaluation and was found to meet sepsis criteria and have pneumonia on imaging.  She was recently hospitalized.  Hospitalist were called for admission for H CAP  PAST MEDICAL HISTORY:   Past Medical History:  Diagnosis Date  . Angioedema   . Anxiety   . Anxiety and depression   . Chronic constipation   . Chronic kidney disease    stage 3  . COPD (chronic obstructive pulmonary disease) ([place])   . Gallstones   . GERD (gastroesophageal reflux disease)   . Hypertension   . Left ventricular hypertrophy   . Osteoarthritis   . Prurigo nodularis   . Tobacco abuse   . Vitamin D deficiency disease      PAST SURGICAL HISTORY:   Past Surgical History:  Procedure Laterality Date  . CHOLECYSTECTOMY    . 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   Procedure: TOTAL KNEE ARTHROPLASTY;  Surgeon: Hessie Knows, MD;  Location: [facility] ORS;  Service: Orthopedics;  Laterality: Right;     SOCIAL HISTORY:   Social History   Tobacco Use  . Smoking status: Former Smoker    Packs/day: 0.50    Years: 60.00    Pack years: 30.00    Types: Cigarettes  . Smokeless tobacco: Never Used  Substance Use Topics  . Alcohol use: No    Alcohol/week: 0.0 standard drinks    Comment: rare     FAMILY HISTORY:   Family  History  Problem Relation Age of Onset  . Alcohol abuse Mother   . Sickle cell anemia Daughter   . Hypertension Son   . Cancer Neg Hx   . COPD Neg Hx   . Diabetes Neg Hx   . Heart disease Neg Hx   . Stroke Neg Hx      DRUG ALLERGIES:   Allergies  Allergen Reactions  . Bee Venom Swelling  . Enalapril Maleate Swelling    MEDICATIONS AT HOME:   Prior to Admission medications   Medication Sig Start Date End Date Taking? Authorizing Provider  acetaminophen (TYLENOL) 650 MG CR tablet Take 650-1,300 mg by mouth every 8 (eight) hours as needed for pain.   Yes [provider]  albuterol (ACCUNEB) 0.63 MG/3ML nebulizer solution Take 3 mLs by nebulization 3 (three) times daily as needed for wheezing.   Yes [provider]  albuterol (PROVENTIL HFA;VENTOLIN HFA) 108 (90 Base) MCG/ACT inhaler Inhale 2 puffs into the lungs every 4 (four) hours as needed for wheezing or shortness of breath.   Yes [provider]  ALPRAZolam Duanne Moron) 0.5 MG tablet Take 0.5 mg by mouth 2 (two) times daily as needed for anxiety.   Yes [provider]  atorvastatin (LIPITOR) 80 MG tablet Take 80 mg by mouth  daily.   Yes [provider]  cetirizine (ZYRTEC) 10 MG tablet Take 5 mg by mouth at bedtime.   Yes [provider]  cholecalciferol (VITAMIN D) 1000 units tablet Take 1,000 Units by mouth daily.   Yes [provider]  clopidogrel (PLAVIX) 75 MG tablet Take 75 mg by mouth daily.   Yes [provider]  diltiazem (CARDIZEM SR) 120 MG 12 hr capsule Take 120 mg by mouth 2 (two) times daily.   Yes [provider]  docusate sodium (COLACE) 100 MG capsule Take 100 mg by mouth daily.   Yes [provider]  FLUoxetine (PROZAC) 10 MG capsule Take 10 mg by mouth daily.   Yes [provider]  Fluticasone-Umeclidin-Vilant 100-62.5-25 MCG/INH AEPB Inhale 1 puff into the lungs daily.   Yes [provider]  gabapentin  (NEURONTIN) 300 MG capsule Take 300 mg by mouth at bedtime.   Yes [provider]  hydrALAZINE (APRESOLINE) 25 MG tablet Take 25 mg by mouth 3 (three) times daily.   Yes [provider]  ipratropium (ATROVENT) 0.03 % nasal spray Place 2 sprays into both nostrils every 12 (twelve) hours.   Yes [provider]  ipratropium-albuterol (DUONEB) 0.5-2.5 (3) MG/3ML SOLN Take 3 mLs by nebulization every 6 (six) hours as needed (shortness of breath).   Yes [provider]  levETIRAcetam (KEPPRA XR) 500 MG 24 hr tablet Take 2 tablets (1,000 mg total) by mouth daily. 10/08/17  Yes Rinehuls, Early Chars, PA-C  linagliptin (TRADJENTA) 5 MG TABS tablet Take 1 tablet (5 mg total) by mouth daily. 10/10/17  Yes Donzetta Starch, NP  meclizine (ANTIVERT) 25 MG tablet Take 25 mg by mouth 2 (two) times daily as needed for dizziness.   Yes [provider]  methocarbamol (ROBAXIN) 500 MG tablet Take 500 mg by mouth every 8 (eight) hours as needed for muscle spasms.   Yes [provider]  pantoprazole (PROTONIX) 40 MG tablet Take 40 mg by mouth daily.   Yes [provider]  tiotropium (SPIRIVA) 18 MCG inhalation capsule Place 18 mcg into inhaler and inhale daily.   Yes [provider]    REVIEW OF SYSTEMS:  Review of Systems  Constitutional: Positive for fever and malaise/fatigue. Negative for chills and weight loss.  HENT: Negative for ear pain, hearing loss and tinnitus.   Eyes: Negative for blurred vision, double vision, pain and redness.  Respiratory: Positive for cough and shortness of breath. Negative for hemoptysis.   Cardiovascular: Negative for chest pain, palpitations, orthopnea and leg swelling.  Gastrointestinal: Negative for abdominal pain, constipation, diarrhea, nausea and vomiting.  Genitourinary: Negative for dysuria, frequency and hematuria.  Musculoskeletal: Negative for back pain, joint pain and neck pain.  Skin:       No acne, rash,  or lesions  Neurological: Negative for dizziness, tremors, focal weakness and weakness.  Endo/Heme/Allergies: Negative for polydipsia. Does not bruise/bleed easily.  Psychiatric/Behavioral: Negative for depression. The patient is not nervous/anxious and does not have insomnia.      VITAL SIGNS:   Vitals:   10/23/17 2307 10/23/17 2308 10/24/17 0020  BP: 117/80  103/60  Pulse: 95  82  Resp: (!) 26  (!) 22  Temp: (!) 102.2 F (39 C)    TempSrc: Oral    SpO2: (!) 87%  90%  Weight:  122.4 kg   Height:  5\' 2"  (1.575 m)    Wt Readings from Last 3 Encounters:  10/23/17 122.4 kg  10/04/17 125.8 kg  10/03/17 123.4 kg    PHYSICAL EXAMINATION:  Physical Exam  Vitals reviewed. Constitutional: She is oriented to person, place, and time. She appears well-developed and well-nourished. No distress.  HENT:  Head: Normocephalic and atraumatic.  Mouth/Throat: Oropharynx is clear and moist.  Eyes: Pupils are equal, round, and reactive to light. Conjunctivae and EOM are normal. No scleral icterus.  Neck: Normal range of motion. Neck supple. No JVD present. No thyromegaly present.  Cardiovascular: Normal rate, regular rhythm and intact distal pulses. Exam reveals no gallop and no friction rub.  No murmur heard. Respiratory: She is in respiratory distress (Mild). She has no wheezes. She has no rales.  Coarse breath sounds  GI: Soft. Bowel sounds are normal. She exhibits no distension. There is no tenderness.  Musculoskeletal: Normal range of motion. She exhibits no edema.  No arthritis, no gout  Lymphadenopathy:    She has no cervical adenopathy.  Neurological: She is alert and oriented to person, place, and time. No cranial nerve deficit.  No dysarthria, no aphasia  Skin: Skin is warm and dry. No rash noted. No erythema.  Psychiatric: She has a normal mood and affect. Her behavior is normal. Judgment and thought content normal.    LABORATORY PANEL:   CBC Recent Labs  Lab 10/23/17 2313   WBC 5.2  HGB 10.3*  HCT 32.0*  PLT 135*   ------------------------------------------------------------------------------------------------------------------  Chemistries  Recent Labs  Lab 10/23/17 2313  NA 138  K 4.5  CL 102  CO2 28  GLUCOSE 218*  BUN 24*  CREATININE 1.90*  CALCIUM 8.6*  AST 17  ALT 15  ALKPHOS 60  BILITOT 0.4   ------------------------------------------------------------------------------------------------------------------  Cardiac Enzymes Recent Labs  Lab 10/23/17 2313  TROPONINI <0.03   ------------------------------------------------------------------------------------------------------------------  RADIOLOGY:  Ct Head Wo Contrast  Result Date: 10/23/2017 CLINICAL DATA:  Altered mental status, shortness of breath, fever 102.2 degrees, oxygen desaturation, history stage III chronic kidney disease, hypertension, former smoker, COPD, GERD EXAM: CT HEAD WITHOUT CONTRAST TECHNIQUE: Contiguous axial images were obtained from the base of the skull through the vertex without intravenous contrast. Sagittal and coronal MPR images reconstructed from axial data set. COMPARISON:  10/04/2017 FINDINGS: Brain: Generalized atrophy. Normal ventricular morphology. No midline shift or mass effect. Small vessel chronic ischemic changes of deep cerebral white matter. No intracranial hemorrhage, mass lesion, evidence of acute infarction, or extra-axial fluid collection. Vascular: No hyperdense vessels Skull: Intact Sinuses/Orbits: Small air-fluid level within sphenoid sinus. BILATERAL chronic mastoid opacification bilaterally with fluid in the middle ear cavities bilaterally as well. Other: N/A IMPRESSION: Atrophy with small vessel chronic ischemic changes of deep cerebral white matter. No acute intracranial abnormalities. Chronic fluid opacification of BILATERAL mastoid air cells and middle ear cavities. Electronically Signed   By: Lavonia Dana M.D.   On: 10/23/2017 23:43   Dg  Chest Port 1 View  Result Date: 10/23/2017 CLINICAL DATA:  77 y/o  F; shortness of breath and fever. EXAM: PORTABLE CHEST 1 VIEW COMPARISON:  10/05/2017 chest radiograph.  10/08/2017 CT chest. FINDINGS: Stable cardiac silhouette given projection and technique. Aortic atherosclerosis with calcification. Patchy consolidations in the lung bases. No pleural effusion or pneumothorax. No acute osseous abnormality is evident. IMPRESSION: Patchy consolidations in the lung bases probably representing pneumonia. Aortic Atherosclerosis (ICD10-I70.0). Electronically Signed   By: Kristine Garbe M.D.   On: 10/23/2017 23:42    EKG:   Orders placed or performed during the hospital encounter of 10/23/17  . ED EKG  12-Lead  . ED EKG 12-Lead  . EKG 12-Lead  . EKG 12-Lead    IMPRESSION AND PLAN:  Principal Problem:   Severe sepsis (HCC) -IV antibiotics, lactic acid mildly elevated, start IV fluids and trend lactic until within normal limits, blood pressure stable, sepsis due to H CAP, cultures sent Active Problems:   HCAP (healthcare-associated pneumonia) -IV antibiotics, PRN supportive treatment   Acute kidney injury superimposed on CKD (HCC) -IV fluids as above, avoid nephrotoxins and monitor for expected improvement   Essential hypertension -continue home meds   COPD (chronic obstructive pulmonary disease) ([place]) -home dose inhalers and medications   Coronary artery disease -continue home meds   Anxiety -home dose anxiolytic   GERD (gastroesophageal reflux disease) -Home dose PPI  Chart review performed and case discussed with ED provider. Labs, imaging and/or ECG reviewed by provider and discussed with patient/family. Management plans discussed with the patient and/or family.  DVT PROPHYLAXIS: SubQ heparin  GI PROPHYLAXIS:  PPI   ADMISSION STATUS: Inpatient     CODE STATUS: Full Code Status History    Date Active Date Inactive Code Status Order ID Comments User Context   10/04/2017 1005  10/10/2017 1813 Full Code [id number]  Estill Bakes Inpatient   10/01/2017 7510 10/04/2017 0904 Full Code [id number]  Lolita Cram, RN Inpatient   09/19/2017 1306 09/22/2017 1622 Full Code [id number]  Gorden Harms, MD Inpatient   09/11/2017 2021 09/15/2017 1510 Full Code [id number]  Gorden Harms, MD Inpatient   08/27/2017 1543 08/29/2017 2159 Full Code [id number]  Hillary Bow, MD ED   06/28/2017 1414 06/30/2017 1748 Full Code [id number]  Salary, Avel Peace, MD Inpatient   06/12/2017 0858 06/16/2017 1659 DNR [id number]  Demetrios Loll, MD Inpatient   05/26/2017 1732 05/30/2017 1446 DNR [id number]  Hillary Bow, MD ED   05/17/2017 1228 05/21/2017 1511 DNR [id number]  Asencion Gowda, NP Inpatient   04/21/2017 0117 04/22/2017 1750 DNR [id number]  Lance Coon, MD Inpatient   04/05/2017 1411 04/08/2017 2101 Full Code [id number]  Saundra Shelling, MD Inpatient   02/08/2016 1414 02/11/2016 1729 Full Code [id number]  Hessie Knows, MD Inpatient   11/27/2015 1645 11/29/2015 1628 Full Code [id number]  Idelle Crouch, MD Inpatient   02/10/2015 2122 02/12/2015 1809 Full Code [id number]  Lytle Butte, MD ED   09/16/2014 1220 09/19/2014 1736 Full Code [id number]  Aldean Jewett, MD Inpatient      TOTAL TIME TAKING CARE OF THIS PATIENT: 45 minutes.   Consuella Scurlock [place]aning 10/24/2017, 12:47 AM  [facility]  Office  [phone]  CC: Primary care physician; Valerie Roys, DO  Note:  This document was prepared using Dragon voice recognition software and may include unintentional dictation errors.

## 2017-10-24 NOTE — Telephone Encounter (Signed)
Last (acute) OV Last routine OV: 09/26/17 Next OV: 11/06/17

## 2017-10-24 NOTE — ED Notes (Signed)
Lab called with a critical high lactic of 2.4 Dr. Cherylann Banas was notified.

## 2017-10-24 NOTE — Progress Notes (Signed)
Pharmacy Antibiotic Note  Katherine [name] is a 77 y.o. female admitted on 10/23/2017 with pneumonia.  Pharmacy has been consulted for vanc/cefepime dosing. Patient received vanc 1g and cefepime 2g IV x 1 in ED  Plan: Will continue vanc 1.25g IV q24h w/ 6 hour stack  Will draw vanc trough 09/14 @ 0600 prior to 4th dose. Will continue cefepime 2g IV q12h   Ke 0.0306 T1/2 24 hours Goal trough 15 - 20 mcg/mL  Height: 5\' 2"  (157.5 cm) Weight: 269 lb 14.4 oz (122.4 kg) IBW/kg (Calculated) : 50.1  Temp (24hrs), Avg:102.2 F (39 C), Min:102.2 F (39 C), Max:102.2 F (39 C)  Recent Labs  Lab 10/23/17 2312 10/23/17 2313  WBC  --  5.2  CREATININE  --  1.90*  LATICACIDVEN 2.4*  --     Estimated Creatinine Clearance: 31.4 mL/min (A) (by C-G formula based on SCr of 1.9 mg/dL (H)).    Allergies  Allergen Reactions  . Bee Venom Swelling  . Enalapril Maleate Swelling    Thank you for allowing pharmacy to be a part of this patient's care.  Tobie Lords, PharmD, BCPS Clinical Pharmacist /10/24/2017

## 2017-10-24 NOTE — NC FL2 (Signed)
[place] LEVEL OF CARE SCREENING TOOL     IDENTIFICATION  Patient Name: Katherine [name] Birthdate: 11/05/1940 Sex: female Admission Date (Current Location): 10/23/2017  [place] and [place] Number:  Engineering geologist and Address:  [facility], [address] [place], [place] [address]      Provider Number: [id number]  Attending Physician Name and Address:  Bettey Costa, MD  Relative Name and Phone Number:  Bennie Hind- husband [phone]    Current Level of Care: Hospital Recommended Level of Care: [place] Prior Approval Number:    Date Approved/Denied:   PASRR Number:    Discharge Plan: SNF    Current Diagnoses: Patient Active Problem List   Diagnosis Date Noted  . Sepsis ([place]) 10/24/2017  . Acute kidney injury superimposed on CKD ([place]) 10/24/2017  . Severe sepsis ([place]) 10/24/2017  . Possible Seizures ([place]) 10/08/2017  . Hyperlipemia 10/08/2017  . 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 Stroke-like episode ([place]) s/p IV tpa 10/04/2017  . Palliative care by specialist   . HCAP (healthcare-associated pneumonia) 09/11/2017  . Elevated rheumatoid factor 09/05/2017  . COPD exacerbation ([place]) 08/27/2017  . Frequent hospital admissions 08/22/2017  . Aphasia 06/28/2017  . Acute on chronic respiratory failure with hypoxia ([place]) 06/12/2017  . Hand pain 03/21/2017  . Dry skin 03/21/2017  . Pain in finger of left hand 09/12/2016  . Chronic fatigue 06/12/2016  . Left knee pain 06/12/2016  . Goals of care, counseling/discussion 03/13/2016  . Primary localized osteoarthritis of right knee 02/08/2016  . OSA and COPD overlap syndrome ([place]) 09/16/2015  . Rotator cuff syndrome 09/07/2015  . 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 Sleep disturbance 04/14/2015  . Coronary artery disease 03/14/2015  . 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 Anxiety   . Tobacco abuse   .  Prurigo nodularis   . GERD (gastroesophageal reflux disease)   . COPD (chronic obstructive pulmonary disease) ([place])   . Osteoarthritis   . Vitamin D deficiency disease   . Chronic constipation   . CKD (chronic kidney disease), stage III ([place])   . LVH (left ventricular hypertrophy) 05/20/2013  . Essential hypertension 05/20/2013  . Morbid obesity ([place]) 05/20/2013    Orientation RESPIRATION BLADDER Height & Weight     Self, Time, Place  O2(2 liters ) Incontinent Weight: 250 lb 14.1 oz (113.8 kg) Height:  5\' 6"  (167.6 cm)  BEHAVIORAL SYMPTOMS/MOOD NEUROLOGICAL BOWEL NUTRITION STATUS  (none) (None ) Continent Diet(Heart Healthy )  AMBULATORY STATUS COMMUNICATION OF NEEDS Skin   Extensive Assist Verbally Normal                       Personal Care Assistance Level of Assistance  Bathing, Feeding, Dressing Bathing Assistance: Limited assistance Feeding assistance: Independent Dressing Assistance: Limited assistance     Functional Limitations Info  Sight, Hearing, Speech Sight Info: Adequate Hearing Info: Impaired(Hard of hearing ) Speech Info: Adequate    SPECIAL CARE FACTORS FREQUENCY  PT (By licensed PT), OT (By licensed OT)     PT Frequency: 5 OT Frequency: 5            Contractures Contractures Info: Not present    Additional Factors Info  Code Status, Allergies Code Status Info: Full Code  Allergies Info: Bee Venom, Enalapril Maleate           Current Medications (10/24/2017):  This is the current hospital active  medication list Current Facility-Administered Medications  Medication Dose Route Frequency Provider Last Rate Last Dose  . 0.9 %  sodium chloride infusion   Intravenous Continuous Lance Coon, MD   Stopped at 10/24/17 [phone]  . acetaminophen (TYLENOL) tablet 650 mg  650 mg Oral Q6H PRN Lance Coon, MD       Or  . acetaminophen (TYLENOL) suppository 650 mg  650 mg Rectal Q6H PRN Lance Coon, MD      . ALPRAZolam Duanne Moron) tablet 0.5 mg  0.5  mg Oral BID PRN Lance Coon, MD      . atorvastatin (LIPITOR) tablet 80 mg  80 mg Oral Daily Lance Coon, MD      . ceFEPIme (MAXIPIME) 2 g in sodium chloride 0.9 % 100 mL IVPB  2 g Intravenous Laurence Spates, MD      . clopidogrel (PLAVIX) tablet 75 mg  75 mg Oral Daily Lance Coon, MD   75 mg at 10/24/17 6160  . diltiazem (CARDIZEM SR) 12 hr capsule 120 mg  120 mg Oral BID Lance Coon, MD   120 mg at 10/24/17 0818  . FLUoxetine (PROZAC) capsule 10 mg  10 mg Oral Daily Lance Coon, MD   10 mg at 10/24/17 0824  . fluticasone furoate-vilanterol (BREO ELLIPTA) 100-25 MCG/INH 1 puff  1 puff Inhalation Daily Lance Coon, MD   1 puff at 10/24/17 0827   And  . umeclidinium bromide (INCRUSE ELLIPTA) 62.5 MCG/INH 1 puff  1 puff Inhalation Daily Lance Coon, MD   1 puff at 10/24/17 0826  . gabapentin (NEURONTIN) capsule 300 mg  300 mg Oral Corwin Levins, MD      . heparin injection 5,000 Units  5,000 Units Subcutaneous Camelia Phenes Lance Coon, MD   5,000 Units at 10/24/17 0328  . hydrALAZINE (APRESOLINE) tablet 25 mg  25 mg Oral TID Lance Coon, MD   25 mg at 10/24/17 7371  . [START ON 10/25/2017] Influenza vac split quadrivalent PF (FLUZONE HIGH-DOSE) injection 0.5 mL  0.5 mL Intramuscular Tomorrow-1000 Lance Coon, MD      . insulin aspart (novoLOG) injection 0-5 Units  0-5 Units Subcutaneous QHS Lance Coon, MD      . insulin aspart (novoLOG) injection 0-9 Units  0-9 Units Subcutaneous TID WC Lance Coon, MD   1 Units at 10/24/17 0820  . ipratropium-albuterol (DUONEB) 0.5-2.5 (3) MG/3ML nebulizer solution 3 mL  3 mL Nebulization Q6H PRN Lance Coon, MD      . levETIRAcetam (KEPPRA XR) 24 hr tablet 1,000 mg  1,000 mg Oral Daily Lance Coon, MD   1,000 mg at 10/24/17 0824  . linagliptin (TRADJENTA) tablet 5 mg  5 mg Oral Daily Mody, Sital, MD      . MEDLINE mouth rinse  15 mL Mouth Rinse BID Bettey Costa, MD   15 mL at 10/24/17 0818  . ondansetron (ZOFRAN) tablet 4 mg  4 mg Oral  Q6H PRN Lance Coon, MD       Or  . ondansetron [facility]) injection 4 mg  4 mg Intravenous Q6H PRN Lance Coon, MD      . pantoprazole (PROTONIX) EC tablet 40 mg  40 mg Oral Daily Lance Coon, MD   40 mg at 10/24/17 0626  . tiotropium (SPIRIVA) inhalation capsule 18 mcg  18 mcg Inhalation Daily Lance Coon, MD   18 mcg at 10/24/17 0825     Discharge Medications: Please see discharge summary for a list of discharge medications.  Relevant Imaging Results:  Relevant Lab Results:   Additional Information    Ieisha Gao  Louretta Shorten, LCSWA

## 2017-10-25 LAB — GLUCOSE, CAPILLARY
GLUCOSE-CAPILLARY: 158 mg/dL — AB (ref 70–99)
GLUCOSE-CAPILLARY: 134 mg/dL — AB (ref 70–99)
Glucose-Capillary: 123 mg/dL — ABNORMAL HIGH (ref 70–99)
Glucose-Capillary: 125 mg/dL — ABNORMAL HIGH (ref 70–99)

## 2017-10-25 MED ORDER — CEFDINIR 300 MG PO CAPS
300.0000 mg | ORAL_CAPSULE | Freq: Two times a day (BID) | ORAL | Status: DC
  Administered 2017-10-25 – 2017-10-29 (×9): 300 mg via ORAL
  Filled 2017-10-25 (×11): qty 1

## 2017-10-25 NOTE — Progress Notes (Signed)
[facility] [place] NAME: Katherine [name]    MR#:  [id number]  DATE OF BIRTH:  July 24, 1940  SUBJECTIVE:  CHIEF COMPLAINT:   Chief Complaint  Patient presents with  . Shortness of Breath  Patient seen and evaluated today Decreased shortness of breath No fever Decreased cough  REVIEW OF SYSTEMS:    ROS  CONSTITUTIONAL: No documented fever.  Has fatigue, weakness. No weight gain, no weight loss.  EYES: No blurry or double vision.  ENT: No tinnitus. No postnasal drip. No redness of the oropharynx.  RESPIRATORY: Occasional cough, no wheeze, no hemoptysis. No dyspnea.  CARDIOVASCULAR: No chest pain. No orthopnea. No palpitations. No syncope.  GASTROINTESTINAL: No nausea, no vomiting or diarrhea. No abdominal pain. No melena or hematochezia.  GENITOURINARY: No dysuria or hematuria.  ENDOCRINE: No polyuria or nocturia. No heat or cold intolerance.  HEMATOLOGY: No anemia. No bruising. No bleeding.  INTEGUMENTARY: No rashes. No lesions.  MUSCULOSKELETAL: No arthritis. No swelling. No gout.  NEUROLOGIC: No numbness, tingling, or ataxia. No seizure-type activity.  PSYCHIATRIC: No anxiety. No insomnia. No ADD.   DRUG ALLERGIES:   Allergies  Allergen Reactions  . Bee Venom Swelling  . Enalapril Maleate Swelling    VITALS:  Blood pressure (!) 114/57, pulse 82, temperature 98.1 F (36.7 C), temperature source Oral, resp. rate 20, height 5\' 6"  (1.676 m), weight 113.8 kg, SpO2 91 %.  PHYSICAL EXAMINATION:   Physical Exam  GENERAL:  77 y.o.-year-old patient lying in the bed with no acute distress.  EYES: Pupils equal, round, reactive to light and accommodation. No scleral icterus. Extraocular muscles intact.  HEENT: Head atraumatic, normocephalic. Oropharynx and nasopharynx clear.  NECK:  Supple, no jugular venous distention. No thyroid enlargement, no tenderness.  LUNGS: Improved breath sounds bilaterally, scattered Rales in both lungs. No  use of accessory muscles of respiration.  CARDIOVASCULAR: S1, S2 normal. No murmurs, rubs, or gallops.  ABDOMEN: Soft, nontender, nondistended. Bowel sounds present. No organomegaly or mass.  EXTREMITIES: No cyanosis, clubbing or edema b/l.    NEUROLOGIC: Cranial nerves II through XII are intact. No focal Motor or sensory deficits b/l.   PSYCHIATRIC: The patient is alert and oriented x 3.  SKIN: No obvious rash, lesion, or ulcer.   LABORATORY PANEL:   CBC Recent Labs  Lab 10/24/17 0431  WBC 4.6  HGB 9.1*  HCT 28.2*  PLT 111*   ------------------------------------------------------------------------------------------------------------------ Chemistries  Recent Labs  Lab 10/23/17 2313 10/24/17 0431  NA 138 140  K 4.5 4.4  CL 102 106  CO2 28 26  GLUCOSE 218* 168*  BUN 24* 24*  CREATININE 1.90* 1.63*  CALCIUM 8.6* 8.0*  AST 17  --   ALT 15  --   ALKPHOS 60  --   BILITOT 0.4  --    ------------------------------------------------------------------------------------------------------------------  Cardiac Enzymes Recent Labs  Lab 10/23/17 2313  TROPONINI <0.03   ------------------------------------------------------------------------------------------------------------------  RADIOLOGY:  Ct Head Wo Contrast  Result Date: 10/23/2017 CLINICAL DATA:  Altered mental status, shortness of breath, fever 102.2 degrees, oxygen desaturation, history stage III chronic kidney disease, hypertension, former smoker, COPD, GERD EXAM: CT HEAD WITHOUT CONTRAST TECHNIQUE: Contiguous axial images were obtained from the base of the skull through the vertex without intravenous contrast. Sagittal and coronal MPR images reconstructed from axial data set. COMPARISON:  10/04/2017 FINDINGS: Brain: Generalized atrophy. Normal ventricular morphology. No midline shift or mass effect. Small vessel chronic ischemic changes of deep cerebral white matter. No intracranial  hemorrhage, mass lesion, evidence  of acute infarction, or extra-axial fluid collection. Vascular: No hyperdense vessels Skull: Intact Sinuses/Orbits: Small air-fluid level within sphenoid sinus. BILATERAL chronic mastoid opacification bilaterally with fluid in the middle ear cavities bilaterally as well. Other: N/A IMPRESSION: Atrophy with small vessel chronic ischemic changes of deep cerebral white matter. No acute intracranial abnormalities. Chronic fluid opacification of BILATERAL mastoid air cells and middle ear cavities. Electronically Signed   By: Lavonia Dana M.D.   On: 10/23/2017 23:43   Dg Chest Port 1 View  Result Date: 10/23/2017 CLINICAL DATA:  77 y/o  F; shortness of breath and fever. EXAM: PORTABLE CHEST 1 VIEW COMPARISON:  10/05/2017 chest radiograph.  10/08/2017 CT chest. FINDINGS: Stable cardiac silhouette given projection and technique. Aortic atherosclerosis with calcification. Patchy consolidations in the lung bases. No pleural effusion or pneumothorax. No acute osseous abnormality is evident. IMPRESSION: Patchy consolidations in the lung bases probably representing pneumonia. Aortic Atherosclerosis (ICD10-I70.0). Electronically Signed   By: Kristine Garbe M.D.   On: 10/23/2017 23:42     ASSESSMENT AND PLAN:  77 year old female patient with history of chronic kidney disease stage III, COPD, GERD, hypertension, osteoarthritis, tobacco abuse currently under hospitalist service for pneumonia  -Sepsis secondary to pneumonia improving Procalcitonin is decreased Cultures so far no growth Discontinue broad-spectrum IV antibiotics and start on oral antibiotic  -Healthcare associated pneumonia improving Continue oral antibiotics  -Acute on chronic kidney injury stage III Avoid nephrotoxins Improved with IV fluids  -COPD stable Continue home dose inhalers   All the records are reviewed and case discussed with Care Management/Social Worker. Management plans discussed with the patient, family and they are  in agreement.  CODE STATUS: Full code  DVT Prophylaxis: SCDs  TOTAL TIME TAKING CARE OF THIS PATIENT: 34 minutes.   POSSIBLE D/C IN 1 to 2 DAYS, DEPENDING ON CLINICAL CONDITION.  Saundra Shelling M.D on 10/25/2017 at 2:37 PM  Between 7am to 6pm - Pager - [phone]  After 6pm go to [email or web address] - password [email or web address] [facility]  Office  [phone]  CC: Primary care physician; Valerie Roys, DO  Note: This dictation was prepared with Dragon dictation along with smaller phrase technology. Any transcriptional errors that result from this process are unintentional.

## 2017-10-25 NOTE — Progress Notes (Signed)
PT Cancellation Note  Patient Details Name: Katherine [name] MRN: [id number] DOB: Oct 01, 1940   Cancelled Treatment:    Reason Eval/Treat Not Completed: Patient declined, no reason specified. Order received.  Chart reviewed.  Pt refused, stating that she has already been up today and sat in her chair and does not feel like she can walk again.  She also stated that she was too cold to get up and was on agreeable when PT encouraged her to get up and move to warm herself.  She requested that PT return tomorrow.  Will re-attempt later if time allows.   Roxanne Gates, PT, DPT 10/25/2017, 3:36 PM

## 2017-10-26 ENCOUNTER — Inpatient Hospital Stay: Payer: Medicare HMO

## 2017-10-26 LAB — GLUCOSE, CAPILLARY
GLUCOSE-CAPILLARY: 137 mg/dL — AB (ref 70–99)
GLUCOSE-CAPILLARY: 102 mg/dL — AB (ref 70–99)
GLUCOSE-CAPILLARY: 83 mg/dL (ref 70–99)
Glucose-Capillary: 129 mg/dL — ABNORMAL HIGH (ref 70–99)
Glucose-Capillary: 138 mg/dL — ABNORMAL HIGH (ref 70–99)
Glucose-Capillary: 82 mg/dL (ref 70–99)

## 2017-10-26 LAB — BLOOD GAS, ARTERIAL
Acid-Base Excess: 1.5 mmol/L (ref 0.0–2.0)
Bicarbonate: 28.3 mmol/L — ABNORMAL HIGH (ref 20.0–28.0)
FIO2: 0.44
O2 Saturation: 88.3 %
PATIENT TEMPERATURE: 37
PO2 ART: 60 mmHg — AB (ref 83.0–108.0)
pCO2 arterial: 55 mmHg — ABNORMAL HIGH (ref 32.0–48.0)
pH, Arterial: 7.32 — ABNORMAL LOW (ref 7.350–7.450)

## 2017-10-26 LAB — MRSA PCR SCREENING: MRSA by PCR: NEGATIVE

## 2017-10-26 MED ORDER — DEXAMETHASONE SODIUM PHOSPHATE 4 MG/ML IJ SOLN
4.0000 mg | Freq: Two times a day (BID) | INTRAMUSCULAR | Status: AC
  Administered 2017-10-26 – 2017-10-27 (×3): 4 mg via INTRAVENOUS
  Filled 2017-10-26 (×3): qty 1

## 2017-10-26 MED ORDER — SENNOSIDES-DOCUSATE SODIUM 8.6-50 MG PO TABS
1.0000 | ORAL_TABLET | Freq: Two times a day (BID) | ORAL | Status: DC
  Administered 2017-10-26 – 2017-10-28 (×4): 1 via ORAL
  Filled 2017-10-26 (×5): qty 1

## 2017-10-26 MED ORDER — LORAZEPAM 2 MG/ML IJ SOLN
INTRAMUSCULAR | Status: AC
  Filled 2017-10-26: qty 1

## 2017-10-26 NOTE — Progress Notes (Signed)
PT Cancellation Note  Patient Details Name: Katherine [name] MRN: [id number] DOB: 1940/11/04   Cancelled Treatment:    Reason Eval/Treat Not Completed: Medical issues which prohibited therapy(Consult received and chart reviewed.  Evaluation attempted; however, during patient interview, noted acute onset of expressive aphasia, desaturation and inability to follow commands.  RN immediately informed; rapid response called.  Care team at beside. Will hold evaluation at this time and re-attempt at later time/date as medically appropriate and available.)  Temika Sutphin H. Owens Shark, PT, DPT, NCS 10/26/17, 3:08 PM [phone]

## 2017-10-26 NOTE — Care Management Important Message (Signed)
Important Message  Patient Details  Name: Katherine [name] MRN: [id number] Date of Birth: April 17, 1940   Medicare Important Message Given:  Yes    Juliann Pulse A Toniann Dickerson 10/26/2017, 10:12 AM

## 2017-10-26 NOTE — Progress Notes (Signed)
PT Cancellation Note  Patient Details Name: Katherine [name] MRN: [id number] DOB: 1940/12/14   Cancelled Treatment:    Reason Eval/Treat Not Completed: Medical issues which prohibited therapy(Per chart review, patient transferred to CCU due to change in medical status.  Per policy, will equire new orders to resume PT services.  Please re-consult as appropriate.)   Demontae Antunes H. Owens Shark, PT, DPT, NCS 10/26/17, 8:48 PM [phone]

## 2017-10-26 NOTE — Progress Notes (Addendum)
CT head reviewed No acute abnormality Old mri brain reviewed Patient has history of cva and brain aneurysm Neurology consult for questionable seizure Patient on oral keppra Neuro check for 24 hrs Updated patients family.

## 2017-10-26 NOTE — Progress Notes (Addendum)
[facility] [place] NAME: Katherine [name]    MR#:  [id number]  DATE OF BIRTH:  02/20/1940  SUBJECTIVE:  CHIEF COMPLAINT:   Chief Complaint  Patient presents with  . Shortness of Breath  Patient seen and evaluated today Decreased shortness of breath No fever Has nausea but no vomitings  REVIEW OF SYSTEMS:    ROS  CONSTITUTIONAL: No documented fever.  Has fatigue, weakness. No weight gain, no weight loss.  EYES: No blurry or double vision.  ENT: No tinnitus. No postnasal drip. No redness of the oropharynx.  RESPIRATORY: Occasional cough, no wheeze, no hemoptysis. No dyspnea.  CARDIOVASCULAR: No chest pain. No orthopnea. No palpitations. No syncope.  GASTROINTESTINAL: No nausea, no vomiting or diarrhea. No abdominal pain. No melena or hematochezia.  GENITOURINARY: No dysuria or hematuria.  ENDOCRINE: No polyuria or nocturia. No heat or cold intolerance.  HEMATOLOGY: No anemia. No bruising. No bleeding.  INTEGUMENTARY: No rashes. No lesions.  MUSCULOSKELETAL: No arthritis. No swelling. No gout.  NEUROLOGIC: No numbness, tingling, or ataxia. No seizure-type activity.  PSYCHIATRIC: No anxiety. No insomnia. No ADD.   DRUG ALLERGIES:   Allergies  Allergen Reactions  . Bee Venom Swelling  . Enalapril Maleate Swelling    VITALS:  Blood pressure 120/61, pulse 79, temperature 98.2 F (36.8 C), temperature source Oral, resp. rate 20, height 5\' 6"  (1.676 m), weight 113.8 kg, SpO2 90 %.  PHYSICAL EXAMINATION:   Physical Exam  GENERAL:  77 y.o.-year-old obese patient lying in the bed with no acute distress.  EYES: Pupils equal, round, reactive to light and accommodation. No scleral icterus. Extraocular muscles intact.  HEENT: Head atraumatic, normocephalic. Oropharynx and nasopharynx clear.  NECK:  Supple, no jugular venous distention. No thyroid enlargement, no tenderness.  LUNGS: Improved breath sounds bilaterally, scattered Rales in  both lungs. No use of accessory muscles of respiration.  CARDIOVASCULAR: S1, S2 normal. No murmurs, rubs, or gallops.  ABDOMEN: Soft, nontender, nondistended. Bowel sounds present. No organomegaly or mass.  EXTREMITIES: No cyanosis, clubbing or edema b/l.    NEUROLOGIC: Cranial nerves II through XII are intact. No focal Motor or sensory deficits b/l.   PSYCHIATRIC: The patient is alert and oriented x 3.  SKIN: No obvious rash, lesion, or ulcer.   LABORATORY PANEL:   CBC Recent Labs  Lab 10/24/17 0431  WBC 4.6  HGB 9.1*  HCT 28.2*  PLT 111*   ------------------------------------------------------------------------------------------------------------------ Chemistries  Recent Labs  Lab 10/23/17 2313 10/24/17 0431  NA 138 140  K 4.5 4.4  CL 102 106  CO2 28 26  GLUCOSE 218* 168*  BUN 24* 24*  CREATININE 1.90* 1.63*  CALCIUM 8.6* 8.0*  AST 17  --   ALT 15  --   ALKPHOS 60  --   BILITOT 0.4  --    ------------------------------------------------------------------------------------------------------------------  Cardiac Enzymes Recent Labs  Lab 10/23/17 2313  TROPONINI <0.03   ------------------------------------------------------------------------------------------------------------------  RADIOLOGY:  No results found.   ASSESSMENT AND PLAN:  77 year old female patient with history of chronic kidney disease stage III, COPD, GERD, hypertension, osteoarthritis, tobacco abuse currently under hospitalist service for pneumonia  -Sepsis improved Procalcitonin is decreased Cultures so far no growth On oral antibiotic that is Omnicef Lactic acid has come down MRSA PCR is negative  -Healthcare associated pneumonia improving Continue oral antibiotics  -Acute on chronic kidney injury stage III Avoid nephrotoxins Improved with IV fluids  -COPD stable Continue home dose inhalers  -Intractable nausea PRN  Phenergan and Zofran  -H/O cva with brain  aneurysm Continue plavix and statin On oral keppra  -Ambulatory dysfunction Physical therapy evaluation today Physical therapy evaluation could not be done yesterday   All the records are reviewed and case discussed with Care Management/Social Worker. Management plans discussed with the patient, family and they are in agreement.  CODE STATUS: Full code  DVT Prophylaxis: SCDs  TOTAL TIME TAKING CARE OF THIS PATIENT: 32 minutes.   POSSIBLE D/C IN 1 to 2 DAYS, DEPENDING ON CLINICAL CONDITION.  Saundra Shelling M.D on 10/26/2017 at 11:19 AM  Between 7am to 6pm - Pager - [phone]  After 6pm go to [email or web address] - password [email or web address] [facility]  Office  [phone]  CC: Primary care physician; Valerie Roys, DO  Note: This dictation was prepared with Dragon dictation along with smaller phrase technology. Any transcriptional errors that result from this process are unintentional.

## 2017-10-26 NOTE — Significant Event (Signed)
Rapid Response Event Note  Overview: Time Called: 8016 Arrival Time: 1500 Event Type: Neurologic  Initial Focused Assessment: Per patient's RN, Christina, and physical therapist, physical therapy had been working with patient and noted new right sided weakness. When rapid response RN arrived in room patient was BEFAST and VAN negative (equal face symmetry, no drift, equal grip strength, alert and only slightly confused to date but hard to tell as patient very hard of hearing and needed many questions repeated). Vital signs BP 120/75, MAP 89, O2 saturations 94% on 6L nasal cannula (had been increased prior to rapid response RN arrival), HR 78 SR.  CBG 102.   At 15:04 as RNs getting repeat vital signs, patient went unresponsive but with eyes open, right hand twitching, and grunting respirations. Patient does not have listed history of seizures in chart but does take PO keppra. O2 levels bewteen 94% and 96 % during this time with no changes in heart rate or rhythm. BP during this time 103/58, MAP 70. At 15:06 when RT drawing ABG, patient came around again and asked "What's going on?" After that patient back to neurological status that she had prior to 15:04.  Interventions: ABG ordered due to history of COPD, slight confusion, and increasing oxygen demands. Dr. Estanislado Pandy ordered STAT head CT and chest x-ray. Those completed with this RN and Romie Minus without incident. Communicated results of tests to Dr. Estanislado Pandy and Margreta Journey RN when arrived back on unit. Placed seizure pads on patient's bed.  Plan of Care (if not transferred): Patient to stay in room 117 for now. Dr. Estanislado Pandy placed consult to neurology. Patient's RN to recall rapid response if patient becomes unstable. Patient to stay on continuous cardiac and oxygen saturation monitoring.  Event Summary: Name of Physician Notified: Dr. Estanislado Pandy at 1500    at    Outcome: Stayed in room and stabalized  Event End Time: [address], [place]

## 2017-10-26 NOTE — Progress Notes (Signed)
Rapid response was called Patient patient on oxygen via nasal cannula at 6 L Patient was confused and altered.  Questionable seizure.  Also had a facial droop which completely resolved.  Patient completely awake and responds to verbal commands.  Will check stat CT head to assess for any acute abnormality.  Chest x-ray to assess for any pneumonia or aspiration.  Check ABG.  O2 sats checked vital signs stable.

## 2017-10-26 NOTE — Progress Notes (Signed)
   10/26/17 1600  Charting Type  Charting Type Reassessment  Orders Chart Check (once per shift) Completed  Neurological  Neuro (WDL) X  Level of Consciousness Responds to Voice  Orientation Level Disoriented to situation  Speech Slurred/Dysarthria;Incomprehensible  Pupil Assessment  Yes  R Pupil Size (mm) 3  R Pupil Shape Round  R Pupil Reaction Brisk  L Pupil Size (mm) 3  L Pupil Shape Round  L Pupil Reaction Brisk  Motor Function/Sensation Assessment Grip  R Hand Grip Weak  L Hand Grip Moderate   Neuro Symptoms Tremors;Drowsiness  Cardiac  Antiarrhythmic device No  Vascular  Vascular (WDL) X  Pulses R dorsalis pedis;L dorsalis pedis  Edema Generalized  Generalized Edema +2  RUE Neurovascular Assessment  R Radial Pulse +2  LUE Neurovascular Assessment  L Radial Pulse +2  RLE Neurovascular Assessment  R Dorsalis Pedis Pulse +1  LLE Neurovascular Assessment  L Dorsalis Pedis Pulse +1  Integumentary  Integumentary (WDL) X  Skin Integrity Abrasion;Ecchymosis  Abrasion Location Chest  Abrasion Location Orientation Left  Ecchymosis Location Abdomen;Arm  Ecchymosis Location Orientation Bilateral  Musculoskeletal  Musculoskeletal (WDL) X  Generalized Weakness Yes  Musculoskeletal Details  RLE Swelling  LLE Swelling  Gastrointestinal  Gastrointestinal (WDL) X  Abdomen Inspection Obese  Stool Characteristics  Bowel Incontinence No  GU Assessment  Genitourinary (WDL) WDL  Genitourinary Symptoms External catheter  Genitalia  Female Genitalia Intact  Urine Characteristics  Urinary Incontinence Yes  Urine Color Yellow/straw  Urine Appearance Cloudy  Urine Odor No odor  External Urinary Catheter  Placement Date/Time: 10/24/17 0300   External Urinary Catheter Type: Female  Psychologist, sport and exercise Dedicated Suction Canister  Securement Method Tape  Psychosocial  Psychosocial (WDL) WDL       Rapid response called due to change in pts mental status and LOC. Pt  unable to answer any questions appropriately and had garbled speech. Pt with right sided weakness and right facial droop. Pt became alert after several minutes and began asking, "what happened to me"? Pt appeared normal and then quickly began grunting and had a blank stare with hand tremors. ICU RN, RT and MD at the bedside. ABG drawn and STAT head CT ordered. Results pending.

## 2017-10-26 NOTE — Progress Notes (Signed)
   10/26/17 1442  Clinical Encounter Type  Visited With Patient not available  Visit Type Code  Referral From Nurse   Rapid Response Code.  No family present, patient being assessed by doctor and multiple staff.  Patient acknowledged Chaplain in the room and Chaplain greeted her but no further conversation was possible due to needed medical interventions.  Subsequently, Chaplain provided pastoral presence and silent prayer during patient's possible seizure episode/altered mental state.  Ended the visit when patient prepped for urgent transport to CT.

## 2017-10-26 NOTE — Progress Notes (Signed)
Pt placed on Bipap after sats continued to drop on CPAP

## 2017-10-27 ENCOUNTER — Inpatient Hospital Stay: Payer: Medicare HMO

## 2017-10-27 DIAGNOSIS — R569 Unspecified convulsions: Secondary | ICD-10-CM

## 2017-10-27 DIAGNOSIS — J9622 Acute and chronic respiratory failure with hypercapnia: Secondary | ICD-10-CM

## 2017-10-27 DIAGNOSIS — J449 Chronic obstructive pulmonary disease, unspecified: Secondary | ICD-10-CM

## 2017-10-27 DIAGNOSIS — G4733 Obstructive sleep apnea (adult) (pediatric): Secondary | ICD-10-CM

## 2017-10-27 DIAGNOSIS — J9621 Acute and chronic respiratory failure with hypoxia: Secondary | ICD-10-CM

## 2017-10-27 LAB — COMPREHENSIVE METABOLIC PANEL
ALT: 14 U/L (ref 0–44)
AST: 13 U/L — AB (ref 15–41)
Albumin: 2.7 g/dL — ABNORMAL LOW (ref 3.5–5.0)
Alkaline Phosphatase: 56 U/L (ref 38–126)
Anion gap: 8 (ref 5–15)
BUN: 23 mg/dL (ref 8–23)
CHLORIDE: 105 mmol/L (ref 98–111)
CO2: 27 mmol/L (ref 22–32)
Calcium: 9 mg/dL (ref 8.9–10.3)
Creatinine, Ser: 1.55 mg/dL — ABNORMAL HIGH (ref 0.44–1.00)
GFR calc Af Amer: 36 mL/min — ABNORMAL LOW (ref >60)
GFR, EST NON AFRICAN AMERICAN: 31 mL/min — AB (ref >60)
Glucose, Bld: 154 mg/dL — ABNORMAL HIGH (ref 70–99)
POTASSIUM: 5.8 mmol/L — AB (ref 3.5–5.1)
SODIUM: 140 mmol/L (ref 135–145)
Total Bilirubin: 0.6 mg/dL (ref 0.3–1.2)
Total Protein: 6.4 g/dL — ABNORMAL LOW (ref 6.5–8.1)

## 2017-10-27 LAB — GLUCOSE, CAPILLARY
GLUCOSE-CAPILLARY: 101 mg/dL — AB (ref 70–99)
Glucose-Capillary: 153 mg/dL — ABNORMAL HIGH (ref 70–99)
Glucose-Capillary: 199 mg/dL — ABNORMAL HIGH (ref 70–99)
Glucose-Capillary: 197 mg/dL — ABNORMAL HIGH (ref 70–99)

## 2017-10-27 LAB — CBC
HCT: 29.8 % — ABNORMAL LOW (ref 35.0–47.0)
Hemoglobin: 9.7 g/dL — ABNORMAL LOW (ref 12.0–16.0)
MCH: 28.6 pg (ref 26.0–34.0)
MCHC: 32.5 g/dL (ref 32.0–36.0)
MCV: 87.9 fL (ref 80.0–100.0)
PLATELETS: 131 10*3/uL — AB (ref 150–440)
RBC: 3.4 MIL/uL — AB (ref 3.80–5.20)
RDW: 20.2 % — ABNORMAL HIGH (ref 11.5–14.5)
WBC: 3.1 10*3/uL — AB (ref 3.6–11.0)

## 2017-10-27 LAB — BASIC METABOLIC PANEL
Anion gap: 10 (ref 5–15)
BUN: 26 mg/dL — AB (ref 8–23)
CO2: 24 mmol/L (ref 22–32)
Calcium: 9 mg/dL (ref 8.9–10.3)
Chloride: 103 mmol/L (ref 98–111)
Creatinine, Ser: 1.77 mg/dL — ABNORMAL HIGH (ref 0.44–1.00)
GFR calc Af Amer: 31 mL/min — ABNORMAL LOW (ref >60)
GFR calc non Af Amer: 27 mL/min — ABNORMAL LOW (ref >60)
GLUCOSE: 243 mg/dL — AB (ref 70–99)
POTASSIUM: 5.4 mmol/L — AB (ref 3.5–5.1)
Sodium: 137 mmol/L (ref 135–145)

## 2017-10-27 LAB — PHOSPHORUS: PHOSPHORUS: 3.3 mg/dL (ref 2.5–4.6)

## 2017-10-27 LAB — MAGNESIUM: MAGNESIUM: 1.6 mg/dL — AB (ref 1.7–2.4)

## 2017-10-27 MED ORDER — INSULIN REGULAR HUMAN 100 UNIT/ML IJ SOLN
10.0000 [IU] | Freq: Once | INTRAMUSCULAR | Status: AC
  Administered 2017-10-27: 10 [IU] via INTRAVENOUS
  Filled 2017-10-27: qty 0.1

## 2017-10-27 MED ORDER — DEXTROSE 50 % IV SOLN
1.0000 | Freq: Once | INTRAVENOUS | Status: AC
  Administered 2017-10-27: 50 mL via INTRAVENOUS
  Filled 2017-10-27: qty 50

## 2017-10-27 MED ORDER — MAGNESIUM SULFATE 2 GM/50ML IV SOLN
2.0000 g | Freq: Once | INTRAVENOUS | Status: AC
  Administered 2017-10-27: 2 g via INTRAVENOUS
  Filled 2017-10-27: qty 50

## 2017-10-27 MED ORDER — SODIUM POLYSTYRENE SULFONATE 15 GM/60ML PO SUSP
15.0000 g | Freq: Once | ORAL | Status: AC
  Administered 2017-10-27: 15 g via ORAL
  Filled 2017-10-27: qty 60

## 2017-10-27 MED ORDER — ALPRAZOLAM 0.5 MG PO TABS
0.5000 mg | ORAL_TABLET | Freq: Two times a day (BID) | ORAL | Status: DC
  Administered 2017-10-27 – 2017-10-29 (×4): 0.5 mg via ORAL
  Filled 2017-10-27 (×4): qty 1

## 2017-10-27 NOTE — Progress Notes (Signed)
Patient's potassium levels came back 5.4, notified Dr. Benjie Karvonen, Dr. Benjie Karvonen stated she would put orders in.

## 2017-10-27 NOTE — Plan of Care (Signed)
  Problem: Fluid Volume: Goal: Hemodynamic stability will improve Outcome: Progressing   Problem: Clinical Measurements: Goal: Diagnostic test results will improve Outcome: Progressing Goal: Signs and symptoms of infection will decrease Outcome: Progressing   Problem: Respiratory: Goal: Ability to maintain adequate ventilation will improve Outcome: Progressing   Problem: Respiratory: Goal: Ability to maintain adequate ventilation will improve Outcome: Progressing Goal: Ability to maintain a clear airway will improve Outcome: Progressing   Problem: Education: Goal: Knowledge of General Education information will improve Description Including pain rating scale, medication(s)/side effects and non-pharmacologic comfort measures Outcome: Progressing   Problem: Health Behavior/Discharge Planning: Goal: Ability to manage health-related needs will improve Outcome: Progressing   Problem: Clinical Measurements: Goal: Ability to maintain clinical measurements within normal limits will improve Outcome: Progressing   Problem: Activity: Goal: Risk for activity intolerance will decrease Outcome: Progressing   Problem: Safety: Goal: Ability to remain free from injury will improve Outcome: Progressing   Problem: Skin Integrity: Goal: Risk for impaired skin integrity will decrease Outcome: Progressing

## 2017-10-27 NOTE — Progress Notes (Signed)
PT Cancellation Note  Patient Details Name: Katherine [name] MRN: [id number] DOB: September 13, 1940   Cancelled Treatment:    Reason Eval/Treat Not Completed: Medical issues which prohibited therapy. Patient with K+ value of 5.8, given that she had rapid response called yesterday and critical lab value today will hold therapy evaluation for today and re-attempt at a later time/date when patient is more appropriate.    Royce Macadamia PT, DPT, CSCS   10/27/2017, 12:44 PM

## 2017-10-27 NOTE — Progress Notes (Signed)
[facility] NAME: Katherine [name]    MR#:  [id number]  DATE OF BIRTH:  06-Aug-1940  SUBJECTIVE:  Patient sent to ICU yesterday after rapid response called It was a question of a seizure as patient was confused and altered.  Head CT was negative for ICH or CVA she is back to baseline.  There is also some question as to whether patient has angioedema there is no evidence of angioedema today  REVIEW OF SYSTEMS:    Review of Systems  Constitutional: Negative.  Negative for chills, fever and malaise/fatigue.  HENT: Negative.  Negative for ear discharge, ear pain, hearing loss, nosebleeds and sore throat.   Eyes: Negative.  Negative for blurred vision and pain.  Respiratory: Negative.  Negative for cough, hemoptysis, shortness of breath and wheezing.   Cardiovascular: Negative.  Negative for chest pain, palpitations and leg swelling.  Gastrointestinal: Negative.  Negative for abdominal pain, blood in stool, diarrhea, nausea and vomiting.  Genitourinary: Negative.  Negative for dysuria.  Musculoskeletal: Negative.  Negative for back pain.  Skin: Negative.   Neurological: Negative for dizziness, tremors, speech change, focal weakness, seizures and headaches.  Endo/Heme/Allergies: Negative.  Does not bruise/bleed easily.  Psychiatric/Behavioral: Negative.  Negative for depression, hallucinations and suicidal ideas.      DRUG ALLERGIES:   Allergies  Allergen Reactions  . Bee Venom Swelling  . Enalapril Maleate Swelling    VITALS:  Blood pressure (!) 111/59, pulse 70, temperature (!) 97.5 F (36.4 C), temperature source Oral, resp. rate 17, height 5' (1.524 m), weight 123.9 kg, SpO2 90 %.  PHYSICAL EXAMINATION:   Physical Exam  GENERAL:  77 y.o.-year-old obese patient lying in the bed with no acute distress.  EYES: Pupils equal, round, reactive to light and accommodation. No scleral icterus. Extraocular muscles intact.  HEENT: Head  atraumatic, normocephalic. Oropharynx and nasopharynx clear. No angiodema NECK:  Supple, no jugular venous distention. No thyroid enlargement, no tenderness.  LUNGS:decreaed throughout no wheezing or rhonchii  CARDIOVASCULAR: S1, S2 normal. No murmurs, rubs, or gallops.  ABDOMEN: Soft, nontender, nondistended. Bowel sounds present. No organomegaly or mass.  EXTREMITIES: No cyanosis, clubbing  R>L edema LE NEUROLOGIC: Cranial nerves II through XII are intact. No focal Motor or sensory deficits b/l.   PSYCHIATRIC: The patient is alert and oriented x 3.  SKIN: No obvious rash, lesion, or ulcer.   LABORATORY PANEL:   CBC Recent Labs  Lab 10/27/17 0457  WBC 3.1*  HGB 9.7*  HCT 29.8*  PLT 131*   ------------------------------------------------------------------------------------------------------------------ Chemistries  Recent Labs  Lab 10/27/17 0457  NA 140  K 5.8*  CL 105  CO2 27  GLUCOSE 154*  BUN 23  CREATININE 1.55*  CALCIUM 9.0  MG 1.6*  AST 13*  ALT 14  ALKPHOS 56  BILITOT 0.6   ------------------------------------------------------------------------------------------------------------------  Cardiac Enzymes Recent Labs  Lab 10/23/17 2313  TROPONINI <0.03   ------------------------------------------------------------------------------------------------------------------  RADIOLOGY:  Ct Head Wo Contrast  Result Date: 10/26/2017 CLINICAL DATA:  AMS. Acute onset of expressive aphasia, desaturation and inability to follow commands, rapid response called. EXAM: CT HEAD WITHOUT CONTRAST TECHNIQUE: Contiguous axial images were obtained from the base of the skull through the vertex without intravenous contrast. COMPARISON:  10/23/2017 FINDINGS: Brain: Diffuse parenchymal atrophy. Patchy areas of hypoattenuation in deep and periventricular white matter bilaterally. Negative for acute intracranial hemorrhage, mass lesion, acute infarction, midline shift, or mass-effect.  Acute infarct may be inapparent on noncontrast CT. Ventricles  and sulci symmetric. Vascular: No hyperdense vessel or unexpected calcification. Skull: Normal. Negative for fracture or focal lesion. Sinuses/Orbits: Opacification of mastoid air cells bilaterally. Fluid level in the sphenoid sinus. Other: None. IMPRESSION: 1. Negative for bleed or other acute intracranial process. 2. Atrophy and nonspecific white matter changes. 3. Chronic opacification of bilateral mastoid air cells. Electronically Signed   By: Lucrezia Europe M.D.   On: 10/26/2017 15:34   Dg Chest Port 1 View  Result Date: 10/26/2017 CLINICAL DATA:  Altered mental status.  Possible seizure. EXAM: PORTABLE CHEST 1 VIEW COMPARISON:  10/23/2017 FINDINGS: Mild cardiomegaly. Low volumes. Bibasilar atelectasis. No pneumothorax or pleural effusion. IMPRESSION: Bibasilar atelectasis.  Cardiomegaly. Electronically Signed   By: Marybelle Killings M.D.   On: 10/26/2017 15:37     ASSESSMENT AND PLAN:  77 year old female patient with history of chronic kidney disease stage III, COPD, GERD, hypertension, osteoarthritis who presented to the hospital due to shortness of breath.  1.  Sepsis: Sepsis due to pneumonia Continue Cefdinir Blood cultures have been negative  2.  Healthcare associated pneumonia: Continue oral antibiotics  3.  Acute metabolic encephalopathy: Patient underwent CT scan on September 13 which was negative Her symptoms resolved quickly. Unlikely TIA or seizure activity Neurology consultation pending  4.  Acute on chronic hypoxic respiratory failure in the setting of pneumonia  5.  Diabetes: Continue current regimen 6.  Chronic kidney disease stage III: Creatinine is at baseline  7.  COPD without acute signs of exacerbation  Management plans discussed with the patient and she is in agreement.  CODE STATUS: Full code  DVT Prophylaxis: SCDs, heparin [place]  TOTAL TIME TAKING CARE OF THIS PATIENT: 29 minutes.   POSSIBLE D/C IN  TOMORROW DEPENDING ON CLINICAL CONDITION.  Selso Mannor M.D on 10/27/2017 at 11:08 AM  Between 7am to 6pm - Pager - [phone]  After 6pm go to [email or web address] - password [email or web address] [facility]  Office  [phone]  CC: Primary care physician; Valerie Roys, DO  Note: This dictation was prepared with Dragon dictation along with smaller phrase technology. Any transcriptional errors that result from this process are unintentional.

## 2017-10-27 NOTE — Consult Note (Signed)
Reason for Consult:? seizure Referring Physician: Mody  CC: ? seizure  HPI: Katherine [name] is an 77 y.o. female who was initially admitted with SOB and cough. Diagnosed with sepsis secondary to PNA.  On yesterday had an episode of unresponsiveness, confusion and facial droop.  Patient spontaneously returned to baseline.  From review of the chart has had previous episodes of aphasia, on the most recent occasion receiving tPA.  Work up unremarkable on both occasions and it was hypothesized that the patient may be having seizures and was started on Keppra.      Past Medical History:  Diagnosis Date  . Angioedema   . Anxiety   . Anxiety and depression   . Chronic constipation   . Chronic kidney disease    stage 3  . COPD (chronic obstructive pulmonary disease) ([place])   . Gallstones   . GERD (gastroesophageal reflux disease)   . Hypertension   . Left ventricular hypertrophy   . Osteoarthritis   . Prurigo nodularis   . Tobacco abuse   . Vitamin D deficiency disease     Past Surgical History:  Procedure Laterality Date  . CHOLECYSTECTOMY    . 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 Alcohol abuse Mother   . Sickle cell anemia Daughter   . Hypertension Son   . Cancer Neg Hx   . COPD Neg Hx   . Diabetes Neg Hx   . Heart disease Neg Hx   . Stroke Neg Hx     Social History:  reports that she has quit smoking. Her smoking use included cigarettes. She has a 30.00 pack-year smoking history. She has never used smokeless tobacco. She reports that she does not drink alcohol or use drugs.  Allergies  Allergen Reactions  . Bee Venom Swelling  . Enalapril Maleate Swelling    Medications:  I have reviewed the patient's current medications. Prior to Admission:   Medications Prior to Admission  Medication Sig Dispense Refill Last Dose  . acetaminophen (TYLENOL) 650 MG CR tablet Take 650-1,300 mg by mouth every 8 (eight) hours as needed for pain.   prn at prn  . albuterol (ACCUNEB) 0.63 MG/3ML nebulizer solution Take 3 mLs by nebulization 3 (three) times daily as needed for wheezing.   prn at prn  . albuterol (PROVENTIL HFA;VENTOLIN HFA) 108 (90 Base) MCG/ACT inhaler Inhale 2 puffs into the lungs every 4 (four) hours as needed for wheezing or shortness of breath.   prn at prn  . ALPRAZolam (XANAX) 0.5 MG tablet Take 0.5 mg by mouth 2 (two) times daily as needed for anxiety.   prn at prn  . atorvastatin (LIPITOR) 80 MG tablet Take 80 mg by mouth daily.   unknown at unknown  . cetirizine (ZYRTEC) 10 MG tablet Take 5 mg by mouth at bedtime.   unknown at unknown  . cholecalciferol (VITAMIN D) 1000 units tablet Take 1,000 Units by mouth daily.   unknown at unknown  . clopidogrel (PLAVIX) 75 MG tablet Take 75 mg by mouth daily.   unknown at unknown  . diltiazem (CARDIZEM SR) 120 MG 12 hr capsule Take 120 mg by mouth 2 (two) times daily.   unknown at unknown  . docusate sodium (COLACE) 100 MG capsule Take 100  mg by mouth daily.   unknown at unknown  . FLUoxetine (PROZAC) 10 MG capsule Take 10 mg by mouth daily.   unknown at unknown  . Fluticasone-Umeclidin-Vilant 100-62.5-25 MCG/INH AEPB Inhale 1 puff into the lungs daily.   unknown at unknown  . gabapentin (NEURONTIN) 300 MG capsule Take 300 mg by mouth at bedtime.   unknown at unknown  . hydrALAZINE (APRESOLINE) 25 MG tablet Take 25 mg by mouth 3 (three) times daily.   unknown at unknown  . ipratropium (ATROVENT) 0.03 % nasal spray Place 2 sprays into both nostrils every 12 (twelve) hours.   unknown at unknown  . ipratropium-albuterol (DUONEB) 0.5-2.5 (3) MG/3ML SOLN Take 3 mLs by nebulization every 6 (six) hours as needed (shortness of breath).   prn at prn  . levETIRAcetam (KEPPRA XR) 500 MG 24 hr tablet Take  2 tablets (1,000 mg total) by mouth daily. 30 tablet 11 unknown at unknown  . linagliptin (TRADJENTA) 5 MG TABS tablet Take 1 tablet (5 mg total) by mouth daily. 30 tablet 2 unknown at unknown  . meclizine (ANTIVERT) 25 MG tablet Take 25 mg by mouth 2 (two) times daily as needed for dizziness.   prn at prn  . methocarbamol (ROBAXIN) 500 MG tablet Take 500 mg by mouth every 8 (eight) hours as needed for muscle spasms.   prn at prn  . pantoprazole (PROTONIX) 40 MG tablet Take 40 mg by mouth daily.   unknown at unknown  . tiotropium (SPIRIVA) 18 MCG inhalation capsule Place 18 mcg into inhaler and inhale daily.   unknown at unknown   Scheduled: . atorvastatin  80 mg Oral Daily  . cefdinir  300 mg Oral Q12H  . clopidogrel  75 mg Oral Daily  . dexamethasone  4 mg Intravenous Q12H  . diltiazem  120 mg Oral BID  . FLUoxetine  10 mg Oral Daily  . fluticasone furoate-vilanterol  1 puff Inhalation Daily   And  . umeclidinium bromide  1 puff Inhalation Daily  . gabapentin  300 mg Oral QHS  . heparin  5,000 Units Subcutaneous Q8H  . hydrALAZINE  25 mg Oral TID  . insulin aspart  0-5 Units Subcutaneous QHS  . insulin aspart  0-9 Units Subcutaneous TID WC  . levETIRAcetam  1,000 mg Oral Daily  . linagliptin  5 mg Oral Daily  . mouth rinse  15 mL Mouth Rinse BID  . pantoprazole  40 mg Oral Daily  . senna-docusate  1 tablet Oral BID    ROS: History obtained from the patient  General ROS: negative for - chills, fatigue, fever, night sweats, weight gain or weight loss Psychological ROS: negative for - behavioral disorder, hallucinations, memory difficulties, mood swings or suicidal ideation Ophthalmic ROS: negative for - blurry vision, double vision, eye pain or loss of vision ENT ROS: negative for - epistaxis, nasal discharge, oral lesions, sore throat, tinnitus or vertigo Allergy and Immunology ROS: negative for - hives or itchy/watery eyes Hematological and Lymphatic ROS: negative for -  bleeding problems, bruising or swollen lymph nodes Endocrine ROS: negative for - galactorrhea, hair pattern changes, polydipsia/polyuria or temperature intolerance Respiratory ROS: cough, shortness of breath  Cardiovascular ROS: negative for - chest pain, dyspnea on exertion, edema or irregular heartbeat Gastrointestinal ROS: negative for - abdominal pain, diarrhea, hematemesis, nausea/vomiting or stool incontinence Genito-Urinary ROS: negative for - dysuria, hematuria, incontinence or urinary frequency/urgency Musculoskeletal ROS: negative for - joint swelling or muscular weakness Neurological ROS: as noted in HPI  Dermatological ROS: negative for rash and skin lesion changes  Physical Examination: Blood pressure 116/70, pulse 70, temperature 98.2 F (36.8 C), temperature source Oral, resp. rate (!) 22, height 5' (1.524 m), weight 123.9 kg, SpO2 90 %.  HEENT-  Normocephalic, no lesions, without obvious abnormality.  Normal external eye and conjunctiva.  Normal TM's bilaterally.  Normal auditory canals and external ears. Normal external nose, mucus membranes and septum.  Normal pharynx. Cardiovascular- S1, S2 normal, pulses palpable throughout   Lungs- chest clear, no wheezing, rales, normal symmetric air entry Abdomen- soft, non-tender; bowel sounds normal; no masses,  no organomegaly Extremities- mild LE edema Lymph-no adenopathy palpable Musculoskeletal-no joint tenderness, deformity or swelling Skin-warm and dry, no hyperpigmentation, vitiligo, or suspicious lesions  Neurological Examination   Mental Status: Alert, oriented, thought content appropriate.  Speech fluent without evidence of aphasia.  Able to follow 3 step commands without difficulty.  Voice raspy. Cranial Nerves: II: Discs flat bilaterally; Visual fields grossly normal, pupils equal, round, reactive to light and accommodation III,IV, VI: ptosis not present, extra-ocular motions intact bilaterally V,VII: smile symmetric,  facial light touch sensation normal bilaterally VIII: hearing normal bilaterally IX,X: gag reflex present XI: bilateral shoulder shrug XII: midline tongue extension Motor: 5/5 in the BUE's with hip flexor weakness bilaterally in the lower extremities likely secondary to habitus Sensory: Pinprick and light touch intact throughout, bilaterally Deep Tendon Reflexes: 2+ in the upper extremities and absent in the lower extremities Plantars: Right: downgoing   Left: downgoing Cerebellar: Normal finger-to-nose testing bilaterally Gait: not tested due to safety concerns    Laboratory Studies:   Basic Metabolic Panel: Recent Labs  Lab 10/23/17 2313 10/24/17 0431 10/27/17 0457  NA 138 140 140  K 4.5 4.4 5.8*  CL 102 106 105  CO2 28 26 27   GLUCOSE 218* 168* 154*  BUN 24* 24* 23  CREATININE 1.90* 1.63* 1.55*  CALCIUM 8.6* 8.0* 9.0  MG  --   --  1.6*  PHOS  --   --  3.3    Liver Function Tests: Recent Labs  Lab 10/23/17 2313 10/27/17 0457  AST 17 13*  ALT 15 14  ALKPHOS 60 56  BILITOT 0.4 0.6  PROT 6.3* 6.4*  ALBUMIN 2.9* 2.7*   Recent Labs  Lab 10/23/17 2313  LIPASE 33   No results for input(s): AMMONIA in the last 168 hours.  CBC: Recent Labs  Lab 10/23/17 2313 10/24/17 0431 10/27/17 0457  WBC 5.2 4.6 3.1*  NEUTROABS 3.9  --   --   HGB 10.3* 9.1* 9.7*  HCT 32.0* 28.2* 29.8*  MCV 88.3 88.0 87.9  PLT 135* 111* 131*    Cardiac Enzymes: Recent Labs  Lab 10/23/17 2313  TROPONINI <0.03    BNP: Invalid input(s): POCBNP  CBG: Recent Labs  Lab 10/26/17 1631 10/26/17 1844 10/26/17 1929 10/26/17 2235 10/27/17 0752  GLUCAP 83 138* 58 101* 153*    Microbiology: Results for orders placed or performed during the hospital encounter of 10/23/17  Blood Culture (routine x 2)     Status: None (Preliminary result)   Collection Time: 10/23/17 11:13 PM  Result Value Ref Range Status   Specimen Description BLOOD RIGHT ASSIST CONTROL  Final   Special  Requests   Final    BOTTLES DRAWN AEROBIC AND ANAEROBIC Blood Culture results may not be optimal due to an excessive volume of blood received in culture bottles   Culture   Final    NO GROWTH 4 DAYS Performed  at [facility], [address] [place], [place] [address]    Report Status PENDING  Incomplete  Blood Culture (routine x 2)     Status: None (Preliminary result)   Collection Time: 10/24/17 12:08 AM  Result Value Ref Range Status   Specimen Description BLOOD BLOOD RIGHT WRIST  Final   Special Requests   Final    BOTTLES DRAWN AEROBIC AND ANAEROBIC Blood Culture results may not be optimal due to an excessive volume of blood received in culture bottles   Culture   Final    NO GROWTH 3 DAYS Performed at [facility], [address] [place], [place] [address]    Report Status PENDING  Incomplete  MRSA PCR Screening     Status: None   Collection Time: 10/26/17  6:54 PM  Result Value Ref Range Status   MRSA by PCR NEGATIVE NEGATIVE Final    Comment:        The GeneXpert MRSA Assay (FDA approved for NASAL specimens only), is one component of a comprehensive MRSA colonization surveillance program. It is not intended to diagnose MRSA infection nor to guide or monitor treatment for MRSA infections. Performed at [facility], [address] [place], [place] [address]     Coagulation Studies: No results for input(s): LABPROT, INR in the last 72 hours.  Urinalysis:  Recent Labs  Lab 10/24/17 0634  COLORURINE YELLOW*  LABSPEC 1.008  PHURINE 5.0  GLUCOSEU NEGATIVE  HGBUR NEGATIVE  BILIRUBINUR NEGATIVE  KETONESUR NEGATIVE  PROTEINUR NEGATIVE  NITRITE NEGATIVE  LEUKOCYTESUR LARGE*    Lipid Panel:     Component Value Date/Time   CHOL 183 10/05/2017 0631   CHOL 157 03/21/2017 0950   CHOL 148 06/15/2016 1046   TRIG 105 10/05/2017 0631   TRIG 109 06/15/2016 1046   HDL 89 10/05/2017 0631   HDL 42 03/21/2017 0950   CHOLHDL 2.1  10/05/2017 0631   VLDL 21 10/05/2017 0631   VLDL 22 06/15/2016 1046   LDLCALC 73 10/05/2017 0631   LDLCALC 85 03/21/2017 0950    HgbA1C:  Lab Results  Component Value Date   HGBA1C 7.7 (H) 10/05/2017    Urine Drug Screen:      Component Value Date/Time   LABOPIA NONE DETECTED 06/28/2017 2225   COCAINSCRNUR NONE DETECTED 06/28/2017 2225   LABBENZ NONE DETECTED 06/28/2017 2225   AMPHETMU NONE DETECTED 06/28/2017 2225   THCU NONE DETECTED 06/28/2017 2225   LABBARB NONE DETECTED 06/28/2017 2225    Alcohol Level: No results for input(s): ETH in the last 168 hours.  Other results: EKG: sinus rhythm at 97 bpm.  Imaging: Ct Head Wo Contrast  Result Date: 10/26/2017 CLINICAL DATA:  AMS. Acute onset of expressive aphasia, desaturation and inability to follow commands, rapid response called. EXAM: CT HEAD WITHOUT CONTRAST TECHNIQUE: Contiguous axial images were obtained from the base of the skull through the vertex without intravenous contrast. COMPARISON:  10/23/2017 FINDINGS: Brain: Diffuse parenchymal atrophy. Patchy areas of hypoattenuation in deep and periventricular white matter bilaterally. Negative for acute intracranial hemorrhage, mass lesion, acute infarction, midline shift, or mass-effect. Acute infarct may be inapparent on noncontrast CT. Ventricles and sulci symmetric. Vascular: No hyperdense vessel or unexpected calcification. Skull: Normal. Negative for fracture or focal lesion. Sinuses/Orbits: Opacification of mastoid air cells bilaterally. Fluid level in the sphenoid sinus. Other: None. IMPRESSION: 1. Negative for bleed or other acute intracranial process. 2. Atrophy and nonspecific white matter changes. 3. Chronic opacification of bilateral mastoid  air cells. Electronically Signed   By: Lucrezia Europe M.D.   On: 10/26/2017 15:34   Dg Chest Port 1 View  Result Date: 10/26/2017 CLINICAL DATA:  Altered mental status.  Possible seizure. EXAM: PORTABLE CHEST 1 VIEW COMPARISON:   10/23/2017 FINDINGS: Mild cardiomegaly. Low volumes. Bibasilar atelectasis. No pneumothorax or pleural effusion. IMPRESSION: Bibasilar atelectasis.  Cardiomegaly. Electronically Signed   By: Marybelle Killings M.D.   On: 10/26/2017 15:37     Assessment/Plan: 77 year old female presenting in sepsis now with seizure-like event.  Patient has had previous events that were unable to be characterized in the past and presumed to possibly be seizure.  It is unclear if this was a similar presentation.  Patient is on Xanax at home though and takes twice per day.  This has not been continued in that manner here and may have precipitated event.  Head CT reviewed and shows no acute changes.  Neurological examination is nonfocal.    Recommendations: 1. Change Xanax to BID dosing as patient reports taking at home 2. Continue Keppra at 1000mg  daily  Alexis Goodell, MD Neurology [phone] 10/27/2017, 1:03 PM

## 2017-10-27 NOTE — Consult Note (Signed)
PULMONARY / CRITICAL CARE MEDICINE   NAME:  Katherine [name], MRN:  [id number], DOB:  1941/01/27, LOS: 3 ADMISSION DATE:  10/23/2017, CONSULTATION DATE: 10/26/2017 REFERRING MD: Dr. Estanislado Pandy.   Reason: Acute change in mental status  BRIEF HISTORY:    77 year old female with a known history of COPD admitted with pneumonia and acute COPD exacerbation.  Transferred to the ICU for acute change in mental status and facial droop that resolved.  CT head negative.  Hypoxemia on ABG.  Questionable angioedema.  Started on Decadron and nocturnal BiPAP.  HISTORY OF PRESENT ILLNESS   This is a 77 year old African-American female who presented to the ED with shortness of breath.  She was diagnosed with pneumonia and acute COPD exacerbation and admitted to the floor.  This evening a rapid response was called for acute confusion, unresponsiveness and facial droop.  Stat CT head was obtained that was negative.  Her ABG showed hypoxemia and mild hypercarbia.  She was transferred to the ICU for further management.  Patient speech remains garbled and her heart also appears swollen.  She has a history of angioedema but is unclear if she is having a recurrence.  She denies chest pain, palpitations, headache, nausea and vomiting. SIGNIFICANT PAST MEDICAL HISTORY    Past Medical History:  Diagnosis Date  . Angioedema   . Anxiety   . Anxiety and depression   . Chronic constipation   . Chronic kidney disease    stage 3  . COPD (chronic obstructive pulmonary disease) ([place])   . Gallstones   . GERD (gastroesophageal reflux disease)   . Hypertension   . Left ventricular hypertrophy   . Osteoarthritis   . Prurigo nodularis   . Tobacco abuse   . Vitamin D deficiency disease     SIGNIFICANT EVENTS:  10/23/2017: Admitted 10/26/2017: Transferred to the ICU STUDIES:   None CULTURES:  Cultures x2-no growth to date  ANTIBIOTICS:  Cefdinir 300 mg every 12 hours  LINES/TUBES:   Peripheral IVs CONSULTANTS:   Neurology SUBJECTIVE:   CONSTITUTIONAL: BP 121/70   Pulse 75   Temp 98.9 F (37.2 C) (Axillary)   Resp (!) 28   Ht 5' (1.524 m)   Wt 123.9 kg   SpO2 93%   BMI 53.35 kg/m   I/O last 3 completed shifts: In: 600 [P.O.:600] Out: 1900 [Urine:1900]  PHYSICAL EXAM: General: Obese, in moderate respiratory distress Neuro: Alert to person and place, follows basic commands, speech is garbled, slow reactions, moves all extremities HEENT: PERRLA, neck is short, with limited range of motion, trachea midline, tongue appears swollen, palates invisible Cardiovascular: Apical pulse regular, S1-S2, no murmur regurg or gallop, +2 pulses bilaterally, + edema Lungs: Lateral breath sounds, diminished in the bases, no wheezes or rhonchi Abdomen: Obese, normal bowel sounds in all 4 quadrants, palpation reveals no organomegaly Musculoskeletal: Positive range of motion, no joint deformity Skin: Warm and dry   ASSESSMENT   Acute change in mental status- CVA ruled out, questionable seizure activity versus hypoxic encephalopathy Healthcare acquired pneumonia Acute on chronic COPD exacerbation secondary to pneumonia Possible OSA/OSH Possible angioedema- based on patient's swollen tongue, and airway obstruction  PLAN  CT head reviewed, no acute findings More dynamic monitoring per ICU protocol Monitor neurological status Nocturnal BiPAP Continue Keppra per primary team Awaiting neurology consult Decadron 4 mg IV x1 now and continue daily every 12 hours x3 days We will consider a CT neck to evaluate for swelling if no improvement with Decadron Continue scheduled bronchodilators, and inhaled  steroid Rest of the treatment plan unchanged SUMMARY OF TODAY'S PLAN:  In addition to the treatment plan established by the primary team, we will place patient on nocturnal BiPAP, and Decadron IV  Best Practice / Goals of Care / Disposition.   DVT PROPHYLAXIS: Subcu heparin SUP: Not indicated NUTRITION:  Heart healthy diet MOBILITY: Out of bed to chair as tolerated GOALS OF CARE: Full treatment without any limitations FAMILY DISCUSSIONS: Patient's husband updated DISPOSITION full code  LABS  Glucose Recent Labs  Lab 10/26/17 1147 10/26/17 1503 10/26/17 1631 10/26/17 1844 10/26/17 1929 10/26/17 2235  GLUCAP 137* 102* 83 138* 82 101*    BMET Recent Labs  Lab 10/23/17 2313 10/24/17 0431  NA 138 140  K 4.5 4.4  CL 102 106  CO2 28 26  BUN 24* 24*  CREATININE 1.90* 1.63*  GLUCOSE 218* 168*    Liver Enzymes Recent Labs  Lab 10/23/17 2313  AST 17  ALT 15  ALKPHOS 60  BILITOT 0.4  ALBUMIN 2.9*    Electrolytes Recent Labs  Lab 10/23/17 2313 10/24/17 0431  CALCIUM 8.6* 8.0*    CBC Recent Labs  Lab 10/23/17 2313 10/24/17 0431 10/27/17 0457  WBC 5.2 4.6 3.1*  HGB 10.3* 9.1* 9.7*  HCT 32.0* 28.2* 29.8*  PLT 135* 111* 131*    ABG Recent Labs  Lab 10/26/17 1507  PHART 7.32*  PCO2ART 55*  PO2ART 60*    Coag's No results for input(s): APTT, INR in the last 168 hours.  Sepsis Markers Recent Labs  Lab 10/23/17 2312 10/24/17 0150 10/24/17 0431  LATICACIDVEN 2.4* 1.0  --   PROCALCITON  --   --  <0.10    Cardiac Enzymes Recent Labs  Lab 10/23/17 2313  TROPONINI <0.03    PAST MEDICAL HISTORY :   She  has a past medical history of Angioedema, Anxiety, Anxiety and depression, Chronic constipation, Chronic kidney disease, COPD (chronic obstructive pulmonary disease) ([place]), Gallstones, GERD (gastroesophageal reflux disease), Hypertension, Left ventricular hypertrophy, Osteoarthritis, Prurigo nodularis, Tobacco abuse, and Vitamin D deficiency disease.  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  Allergies  Allergen Reactions  . Bee Venom Swelling  . Enalapril Maleate Swelling    No current facility-administered medications on  file prior to encounter.    Current Outpatient Medications on File Prior to Encounter  Medication Sig  . acetaminophen (TYLENOL) 650 MG CR tablet Take 650-1,300 mg by mouth every 8 (eight) hours as needed for pain.  [name] albuterol (ACCUNEB) 0.63 MG/3ML nebulizer solution Take 3 mLs by nebulization 3 (three) times daily as needed for wheezing.  [name] albuterol (PROVENTIL HFA;VENTOLIN HFA) 108 (90 Base) MCG/ACT inhaler Inhale 2 puffs into the lungs every 4 (four) hours as needed for wheezing or shortness of breath.  . ALPRAZolam (XANAX) 0.5 MG tablet Take 0.5 mg by mouth 2 (two) times daily as needed for anxiety.  [name] atorvastatin (LIPITOR) 80 MG tablet Take 80 mg by mouth daily.  . cetirizine (ZYRTEC) 10 MG tablet Take 5 mg by mouth at bedtime.  . cholecalciferol (VITAMIN D) 1000 units tablet Take 1,000 Units by mouth daily.  . clopidogrel (PLAVIX) 75 MG tablet Take 75 mg by mouth daily.  [name] diltiazem (CARDIZEM SR) 120 MG 12 hr capsule Take 120 mg by mouth 2 (two) times daily.  [name] docusate sodium (COLACE) 100 MG capsule Take 100 mg by mouth daily.  [name] FLUoxetine (PROZAC)  10 MG capsule Take 10 mg by mouth daily.  . Fluticasone-Umeclidin-Vilant 100-62.5-25 MCG/INH AEPB Inhale 1 puff into the lungs daily.  [name] gabapentin (NEURONTIN) 300 MG capsule Take 300 mg by mouth at bedtime.  . hydrALAZINE (APRESOLINE) 25 MG tablet Take 25 mg by mouth 3 (three) times daily.  [name] ipratropium (ATROVENT) 0.03 % nasal spray Place 2 sprays into both nostrils every 12 (twelve) hours.  [name] ipratropium-albuterol (DUONEB) 0.5-2.5 (3) MG/3ML SOLN Take 3 mLs by nebulization every 6 (six) hours as needed (shortness of breath).  . levETIRAcetam (KEPPRA XR) 500 MG 24 hr tablet Take 2 tablets (1,000 mg total) by mouth daily.  [name] linagliptin (TRADJENTA) 5 MG TABS tablet Take 1 tablet (5 mg total) by mouth daily.  . meclizine (ANTIVERT) 25 MG tablet Take 25 mg by mouth 2 (two) times daily as needed for dizziness.  . methocarbamol (ROBAXIN) 500  MG tablet Take 500 mg by mouth every 8 (eight) hours as needed for muscle spasms.  . pantoprazole (PROTONIX) 40 MG tablet Take 40 mg by mouth daily.  [name] tiotropium (SPIRIVA) 18 MCG inhalation capsule Place 18 mcg into inhaler and inhale daily.  . cetirizine (ZYRTEC) 10 MG tablet TAKE 1/2 TABLET(5 MG) BY MOUTH AT BEDTIME    FAMILY HISTORY:   Her family history includes Alcohol abuse in her mother; Hypertension in her son; Sickle cell anemia in her daughter. There is no history of Cancer, COPD, Diabetes, Heart disease, or Stroke.  SOCIAL HISTORY:  She  reports that she has quit smoking. Her smoking use included cigarettes. She has a 30.00 pack-year smoking history. She has never used smokeless tobacco. She reports that she does not drink alcohol or use drugs.  REVIEW OF SYSTEMS:    Constitutional: Negative for fever and chills.  HENT: Negative for congestion and rhinorrhea.  Eyes: Negative for redness and visual disturbance.  Respiratory: Positive for shortness of breath and occasional wheezing.  Cardiovascular: Negative for chest pain and palpitations.  Gastrointestinal: Negative  for nausea , vomiting and abdominal pain and  loose stools Genitourinary: Negative for dysuria and urgency.  Endocrine: Denies polyuria, polyphagia and heat intolerance Musculoskeletal: Negative for myalgias and arthralgias.  Skin: Negative for pallor and wound.  Neurological: Negative for dizziness and headaches

## 2017-10-28 ENCOUNTER — Inpatient Hospital Stay: Payer: Medicare HMO

## 2017-10-28 LAB — BASIC METABOLIC PANEL
Anion gap: 7 (ref 5–15)
BUN: 27 mg/dL — AB (ref 8–23)
CALCIUM: 8.8 mg/dL — AB (ref 8.9–10.3)
CO2: 28 mmol/L (ref 22–32)
CREATININE: 1.68 mg/dL — AB (ref 0.44–1.00)
Chloride: 103 mmol/L (ref 98–111)
GFR calc non Af Amer: 28 mL/min — ABNORMAL LOW (ref >60)
GFR, EST AFRICAN AMERICAN: 33 mL/min — AB (ref >60)
Glucose, Bld: 196 mg/dL — ABNORMAL HIGH (ref 70–99)
Potassium: 5.5 mmol/L — ABNORMAL HIGH (ref 3.5–5.1)
SODIUM: 138 mmol/L (ref 135–145)

## 2017-10-28 LAB — CULTURE, BLOOD (ROUTINE X 2): CULTURE: NO GROWTH

## 2017-10-28 LAB — GLUCOSE, CAPILLARY
GLUCOSE-CAPILLARY: 170 mg/dL — AB (ref 70–99)
GLUCOSE-CAPILLARY: 169 mg/dL — AB (ref 70–99)
Glucose-Capillary: 185 mg/dL — ABNORMAL HIGH (ref 70–99)
Glucose-Capillary: 261 mg/dL — ABNORMAL HIGH (ref 70–99)

## 2017-10-28 MED ORDER — FUROSEMIDE 10 MG/ML IJ SOLN: 40.0000 mg | Freq: Two times a day (BID) | INTRAMUSCULAR | Status: DC

## 2017-10-28 MED ORDER — SODIUM POLYSTYRENE SULFONATE 15 GM/60ML PO SUSP
30.0000 g | Freq: Once | ORAL | Status: AC
  Administered 2017-10-28: 30 g via ORAL
  Filled 2017-10-28: qty 120

## 2017-10-28 MED ORDER — DEXTROSE 50 % IV SOLN
1.0000 | Freq: Once | INTRAVENOUS | Status: AC
  Administered 2017-10-28: 50 mL via INTRAVENOUS
  Filled 2017-10-28: qty 50

## 2017-10-28 MED ORDER — FUROSEMIDE 10 MG/ML IJ SOLN
40.0000 mg | Freq: Two times a day (BID) | INTRAMUSCULAR | Status: DC
  Administered 2017-10-28 (×2): 40 mg via INTRAVENOUS
  Filled 2017-10-28 (×3): qty 4

## 2017-10-28 MED ORDER — METHYLPREDNISOLONE SODIUM SUCC 40 MG IJ SOLR
40.0000 mg | Freq: Two times a day (BID) | INTRAMUSCULAR | Status: DC
  Administered 2017-10-28 – 2017-10-29 (×3): 40 mg via INTRAVENOUS
  Filled 2017-10-28 (×3): qty 1

## 2017-10-28 MED ORDER — ALPRAZOLAM 0.5 MG PO TABS: 0.5000 mg | ORAL_TABLET | Freq: Two times a day (BID) | ORAL | Status: DC | PRN

## 2017-10-28 MED ORDER — INSULIN REGULAR HUMAN 100 UNIT/ML IJ SOLN
10.0000 [IU] | Freq: Once | INTRAMUSCULAR | Status: AC
  Administered 2017-10-28: 13:00:00 10 [IU] via INTRAVENOUS
  Filled 2017-10-28: qty 0.1

## 2017-10-28 NOTE — Plan of Care (Signed)
Pt with hypoxia during PT session, pt dropped to low 80% while still in bed.  4L O2 continued with O2 sats in the high 90's.  Lasix, solumedrol given.  Potassium  5.5, kayexalate, D5 and regular insulin given.  Pt had a large BM.

## 2017-10-28 NOTE — Progress Notes (Signed)
PT Cancellation Note  Patient Details Name: Katherine [name] MRN: [id number] DOB: [birth year]   Cancelled Treatment:    Reason Eval/Treat Not Completed: Medical issues which prohibited therapy.  Order received.  Chart reviewed.  Patient attempted to sit at EOB with 2L O2 and desat to 80%.  Pt reported dizziness when upright.  RN notified and O2 increased to 4L with return of O2 to 90% after 3-4 min of deep breathing.  RN and PT assisted pt in return to bed and positioning.  Will re-attempt later if appropriate.   Roxanne Gates, PT, DPT 10/28/2017, 10:31 AM

## 2017-10-28 NOTE — Progress Notes (Signed)
Bipap refused 

## 2017-10-28 NOTE — Progress Notes (Signed)
[facility] NAME: Kallan Bischoff    MR#:  [id number]  DATE OF BIRTH:  December 10, 1940  SUBJECTIVE:  Patient sent to ICU yesterday after rapid response called It was a question of a seizure as patient was confused and altered.  Head CT was negative for ICH or CVA she is back to baseline.  There is also some question as to whether patient has angioedema there is no evidence of angioedema today  REVIEW OF SYSTEMS:    Review of Systems  Constitutional: Negative.  Negative for chills, fever and malaise/fatigue.  HENT: Negative.  Negative for ear discharge, ear pain, hearing loss, nosebleeds and sore throat.   Eyes: Negative.  Negative for blurred vision and pain.  Respiratory: Positive for cough. Negative for hemoptysis, shortness of breath and wheezing.   Cardiovascular: Negative.  Negative for chest pain, palpitations and leg swelling.  Gastrointestinal: Negative.  Negative for abdominal pain, blood in stool, diarrhea, nausea and vomiting.  Genitourinary: Negative.  Negative for dysuria.  Musculoskeletal: Negative.  Negative for back pain.  Skin: Negative.   Neurological: Negative for dizziness, tremors, speech change, focal weakness, seizures and headaches.  Endo/Heme/Allergies: Negative.  Does not bruise/bleed easily.  Psychiatric/Behavioral: Negative.  Negative for depression, hallucinations and suicidal ideas.      DRUG ALLERGIES:   Allergies  Allergen Reactions  . Bee Venom Swelling  . Enalapril Maleate Swelling    VITALS:  Blood pressure 117/63, pulse 65, temperature 97.6 F (36.4 C), temperature source Oral, resp. rate 20, height 5' (1.524 m), weight 123.9 kg, SpO2 94 %.  PHYSICAL EXAMINATION:   Physical Exam  Constitutional: She is oriented to person, place, and time. No distress.  obese  HENT:  Head: Normocephalic.  Eyes: No scleral icterus.  Neck: Normal range of motion. Neck supple. No JVD present. No tracheal  deviation present.  Cardiovascular: Normal rate, regular rhythm and normal heart sounds. Exam reveals no gallop and no friction rub.  No murmur heard. Pulmonary/Chest: Effort normal. No respiratory distress. She has decreased breath sounds in the right middle field and the left middle field. She has no wheezes. She has no rales. She exhibits no tenderness.  Abdominal: Soft. Bowel sounds are normal. She exhibits no distension and no mass. There is no tenderness. There is no rebound and no guarding.  Musculoskeletal: Normal range of motion. She exhibits no edema.  Neurological: She is alert and oriented to person, place, and time.  Skin: Skin is warm. No rash noted. No erythema.  Psychiatric: Judgment normal.      LABORATORY PANEL:   CBC Recent Labs  Lab 10/27/17 0457  WBC 3.1*  HGB 9.7*  HCT 29.8*  PLT 131*   ------------------------------------------------------------------------------------------------------------------ Chemistries  Recent Labs  Lab 10/27/17 0457  10/28/17 0532  NA 140   < > 138  K 5.8*   < > 5.5*  CL 105   < > 103  CO2 27   < > 28  GLUCOSE 154*   < > 196*  BUN 23   < > 27*  CREATININE 1.55*   < > 1.68*  CALCIUM 9.0   < > 8.8*  MG 1.6*  --   --   AST 13*  --   --   ALT 14  --   --   ALKPHOS 56  --   --   BILITOT 0.6  --   --    < > = values in this interval  not displayed.   ------------------------------------------------------------------------------------------------------------------  Cardiac Enzymes Recent Labs  Lab 10/23/17 2313  TROPONINI <0.03   ------------------------------------------------------------------------------------------------------------------  RADIOLOGY:  Ct Head Wo Contrast  Result Date: 10/26/2017 CLINICAL DATA:  AMS. Acute onset of expressive aphasia, desaturation and inability to follow commands, rapid response called. EXAM: CT HEAD WITHOUT CONTRAST TECHNIQUE: Contiguous axial images were obtained from the base of  the skull through the vertex without intravenous contrast. COMPARISON:  10/23/2017 FINDINGS: Brain: Diffuse parenchymal atrophy. Patchy areas of hypoattenuation in deep and periventricular white matter bilaterally. Negative for acute intracranial hemorrhage, mass lesion, acute infarction, midline shift, or mass-effect. Acute infarct may be inapparent on noncontrast CT. Ventricles and sulci symmetric. Vascular: No hyperdense vessel or unexpected calcification. Skull: Normal. Negative for fracture or focal lesion. Sinuses/Orbits: Opacification of mastoid air cells bilaterally. Fluid level in the sphenoid sinus. Other: None. IMPRESSION: 1. Negative for bleed or other acute intracranial process. 2. Atrophy and nonspecific white matter changes. 3. Chronic opacification of bilateral mastoid air cells. Electronically Signed   By: Lucrezia Europe M.D.   On: 10/26/2017 15:34   US Venous Img Lower Unilateral Right  Result Date: 10/27/2017 CLINICAL DATA:  Right lower extremity pain and edema. History of smoking. Evaluate for DVT. EXAM: RIGHT LOWER EXTREMITY VENOUS DOPPLER ULTRASOUND TECHNIQUE: Gray-scale sonography with graded compression, as well as color Doppler and duplex ultrasound were performed to evaluate the lower extremity deep venous systems from the level of the common femoral vein and including the common femoral, femoral, profunda femoral, popliteal and calf veins including the posterior tibial, peroneal and gastrocnemius veins when visible. The superficial great saphenous vein was also interrogated. Spectral Doppler was utilized to evaluate flow at rest and with distal augmentation maneuvers in the common femoral, femoral and popliteal veins. COMPARISON:  None. FINDINGS: Examination is degraded due to patient body habitus and poor sonographic window. Contralateral Common Femoral Vein: Respiratory phasicity is normal and symmetric with the symptomatic side. No evidence of thrombus. Normal compressibility. Common  Femoral Vein: No evidence of thrombus. Normal compressibility, respiratory phasicity and response to augmentation. Saphenofemoral Junction: No evidence of thrombus. Normal compressibility and flow on color Doppler imaging. Profunda Femoral Vein: No evidence of thrombus. Normal compressibility and flow on color Doppler imaging. Femoral Vein: No evidence of thrombus. Normal compressibility, respiratory phasicity and response to augmentation. Popliteal Vein: No evidence of thrombus. Normal compressibility, respiratory phasicity and response to augmentation. Calf Veins: No evidence of thrombus. Normal compressibility and flow on color Doppler imaging. Superficial Great Saphenous Vein: No evidence of thrombus. Normal compressibility. Venous Reflux:  None. Other Findings:  None. IMPRESSION: No evidence of DVT within the right lower extremity. Electronically Signed   By: Sandi Mariscal M.D.   On: 10/27/2017 14:08   Dg Chest Port 1 View  Result Date: 10/26/2017 CLINICAL DATA:  Altered mental status.  Possible seizure. EXAM: PORTABLE CHEST 1 VIEW COMPARISON:  10/23/2017 FINDINGS: Mild cardiomegaly. Low volumes. Bibasilar atelectasis. No pneumothorax or pleural effusion. IMPRESSION: Bibasilar atelectasis.  Cardiomegaly. Electronically Signed   By: Marybelle Killings M.D.   On: 10/26/2017 15:37     ASSESSMENT AND PLAN:  77 year old female patient with history of chronic kidney disease stage III, COPD, GERD, hypertension, osteoarthritis who presented to the hospital due to shortness of breath.  1.  Sepsis: Sepsis due to pneumonia Continue Cefdinir for now Will obtain CXR this am Blood cultures have been negative  2.  Healthcare associated pneumonia: Continue oral antibiotics  3.  Acute metabolic encephalopathy: Patient underwent  CT scan on September 13 which was negative Her symptoms resolved quickly. Unlikely TIA or seizure activity Neurology consultation pending  4.  Acute on chronic hypoxic respiratory failure  in the setting of pneumonia Wean oxygen to baseline 2 to 3 L Check x-ray Start IV steroids for wheezing Cardiogram August 2019 shows normal ejection fraction and no evidence of diastolic dysfunction   5.  Diabetes: Continue current regimen 6.  Chronic kidney disease stage III: Creatinine is at baseline  7.  COPD mild signs of exacerbation today: Start IV steroids Continue inhaler nebs Check x-ray 8.  Hyperkalemia: Treated with Kayexalate, insulin and dextrose Management plans discussed with the patient and she is in agreement.  CODE STATUS: Full code  DVT Prophylaxis: SCDs, heparin Divide  TOTAL TIME TAKING CARE OF THIS PATIENT: 29 minutes.   POSSIBLE D/C IN TOMORROW DEPENDING ON CLINICAL CONDITION.  Anjelika Ausburn M.D on 10/28/2017 at 11:08 AM  Between 7am to 6pm - Pager - [phone]  After 6pm go to [email or web address] - password [email or web address] [facility]  Office  [phone]  CC: Primary care physician; Valerie Roys, DO  Note: This dictation was prepared with Dragon dictation along with smaller phrase technology. Any transcriptional errors that result from this process are unintentional.

## 2017-10-29 ENCOUNTER — Other Ambulatory Visit: Payer: Self-pay | Admitting: *Deleted

## 2017-10-29 ENCOUNTER — Inpatient Hospital Stay: Payer: Medicare HMO

## 2017-10-29 ENCOUNTER — Ambulatory Visit: Payer: Self-pay | Admitting: *Deleted

## 2017-10-29 DIAGNOSIS — E1121 Type 2 diabetes mellitus with diabetic nephropathy: Secondary | ICD-10-CM | POA: Diagnosis not present

## 2017-10-29 DIAGNOSIS — L8990 Pressure ulcer of unspecified site, unspecified stage: Secondary | ICD-10-CM

## 2017-10-29 DIAGNOSIS — Z7401 Bed confinement status: Secondary | ICD-10-CM | POA: Diagnosis not present

## 2017-10-29 DIAGNOSIS — J189 Pneumonia, unspecified organism: Secondary | ICD-10-CM | POA: Diagnosis not present

## 2017-10-29 DIAGNOSIS — J449 Chronic obstructive pulmonary disease, unspecified: Secondary | ICD-10-CM | POA: Diagnosis not present

## 2017-10-29 DIAGNOSIS — Z23 Encounter for immunization: Secondary | ICD-10-CM | POA: Diagnosis not present

## 2017-10-29 DIAGNOSIS — J9621 Acute and chronic respiratory failure with hypoxia: Secondary | ICD-10-CM | POA: Diagnosis not present

## 2017-10-29 DIAGNOSIS — R569 Unspecified convulsions: Secondary | ICD-10-CM | POA: Diagnosis not present

## 2017-10-29 DIAGNOSIS — E119 Type 2 diabetes mellitus without complications: Secondary | ICD-10-CM | POA: Diagnosis not present

## 2017-10-29 DIAGNOSIS — Y95 Nosocomial condition: Secondary | ICD-10-CM | POA: Diagnosis not present

## 2017-10-29 DIAGNOSIS — G459 Transient cerebral ischemic attack, unspecified: Secondary | ICD-10-CM | POA: Diagnosis not present

## 2017-10-29 DIAGNOSIS — E785 Hyperlipidemia, unspecified: Secondary | ICD-10-CM | POA: Diagnosis not present

## 2017-10-29 DIAGNOSIS — G9341 Metabolic encephalopathy: Secondary | ICD-10-CM | POA: Diagnosis not present

## 2017-10-29 DIAGNOSIS — I2510 Atherosclerotic heart disease of native coronary artery without angina pectoris: Secondary | ICD-10-CM | POA: Diagnosis not present

## 2017-10-29 DIAGNOSIS — N179 Acute kidney failure, unspecified: Secondary | ICD-10-CM | POA: Diagnosis not present

## 2017-10-29 DIAGNOSIS — R4182 Altered mental status, unspecified: Secondary | ICD-10-CM | POA: Diagnosis not present

## 2017-10-29 DIAGNOSIS — A419 Sepsis, unspecified organism: Secondary | ICD-10-CM | POA: Diagnosis not present

## 2017-10-29 DIAGNOSIS — M6281 Muscle weakness (generalized): Secondary | ICD-10-CM | POA: Diagnosis not present

## 2017-10-29 DIAGNOSIS — I10 Essential (primary) hypertension: Secondary | ICD-10-CM | POA: Diagnosis not present

## 2017-10-29 DIAGNOSIS — K219 Gastro-esophageal reflux disease without esophagitis: Secondary | ICD-10-CM | POA: Diagnosis not present

## 2017-10-29 DIAGNOSIS — E7849 Other hyperlipidemia: Secondary | ICD-10-CM | POA: Diagnosis not present

## 2017-10-29 HISTORY — DX: Pressure ulcer of unspecified site, unspecified stage: L89.90

## 2017-10-29 LAB — BASIC METABOLIC PANEL
Anion gap: 6 (ref 5–15)
BUN: 36 mg/dL — AB (ref 8–23)
CHLORIDE: 98 mmol/L (ref 98–111)
CO2: 34 mmol/L — AB (ref 22–32)
CREATININE: 1.77 mg/dL — AB (ref 0.44–1.00)
Calcium: 8.7 mg/dL — ABNORMAL LOW (ref 8.9–10.3)
GFR calc non Af Amer: 27 mL/min — ABNORMAL LOW (ref >60)
GFR, EST AFRICAN AMERICAN: 31 mL/min — AB (ref >60)
GLUCOSE: 209 mg/dL — AB (ref 70–99)
Potassium: 4.5 mmol/L (ref 3.5–5.1)
Sodium: 138 mmol/L (ref 135–145)

## 2017-10-29 LAB — GLUCOSE, CAPILLARY
GLUCOSE-CAPILLARY: 275 mg/dL — AB (ref 70–99)
GLUCOSE-CAPILLARY: 237 mg/dL — AB (ref 70–99)
Glucose-Capillary: 171 mg/dL — ABNORMAL HIGH (ref 70–99)

## 2017-10-29 LAB — CULTURE, BLOOD (ROUTINE X 2): Culture: NO GROWTH

## 2017-10-29 MED ORDER — PREDNISONE 10 MG PO TABS: 40.0000 mg | ORAL_TABLET | Freq: Every day | ORAL | 0 refills | Status: AC

## 2017-10-29 MED ORDER — CEFDINIR 300 MG PO CAPS: 300.0000 mg | ORAL_CAPSULE | Freq: Two times a day (BID) | ORAL | 0 refills | Status: AC

## 2017-10-29 NOTE — Evaluation (Signed)
Physical Therapy Evaluation Patient Details Name: Katherine [name] MRN: [id number] DOB: [birth year] Today's Date: 10/29/2017   History of Present Illness  presented to ER secondary to worsening fatigue, SOB; admitted with sepsis related to HCAP.  Hospital course additionally significant for acute metabolic encephalopathy and possible seizure episode (involving rapid response and transfer to CCU).   Clinical Impression  Upon evaluation, patient alert and oriented; follows simple commands.  Bilat UE/LE strength generally weak and deconditioned, but grossly functional for basic transfers and mobility.  Currently requiring min/mod assist for bed mobility; min assist for sit/stand, basic transfers and gait (5') with RW.  Broad BOS, limited balance reactions and limited activity tolerance evident.  Noted desat to 88% on 3L with minimal exertion, requiring seated rest and 30-45 seconds of pursed lip breathing for recovery to >92%.  Unable to demonstrate ability to safely complete distances/activities required to facilitate safe discharge home at this time. Would benefit from skilled PT to address above deficits and promote optimal return to PLOF; recommend transition to STR upon discharge from acute hospitalization.  May benefit from initial stages of discussion/planning for long-term increase in support services once course of rehab completed.     Follow Up Recommendations SNF    Equipment Recommendations  None recommended by PT    Recommendations for Other Services       Precautions / Restrictions Precautions Precautions: Fall Restrictions Weight Bearing Restrictions: No      Mobility  Bed Mobility Overal bed mobility: Needs Assistance Bed Mobility: Supine to Sit     Supine to sit: Min assist;Mod assist     General bed mobility comments: for truncal elevation and scooting towards edge of bed  Transfers Overall transfer level: Needs assistance Equipment used: Rolling walker (2  wheeled) Transfers: Sit to/from Stand Sit to Stand: Min assist         General transfer comment: pulls up on RW despite cuing; very broad BOS, heavy use of momentum  Ambulation/Gait Ambulation/Gait assistance: Min assist Gait Distance (Feet): 5 Feet Assistive device: Rolling walker (2 wheeled)       General Gait Details: broad BOS with limited step height/length; forward flexed posture with mod WBing bilat UEs.  Mild give in L LE, but no overt buckling.  Desat to 88% with bed/chair transfer, requiring seated rest and 30-45 seconds seated rest and pursed lip breathing for recovery.  Stairs            Wheelchair Mobility    Modified Rankin (Stroke Patients Only)       Balance Overall balance assessment: Needs assistance Sitting-balance support: Feet supported;No upper extremity supported Sitting balance-Leahy Scale: Good     Standing balance support: Bilateral upper extremity supported Standing balance-Leahy Scale: Fair                               Pertinent Vitals/Pain Pain Assessment: No/denies pain    Home Living Family/patient expects to be discharged to:: Skilled nursing facility   Available Help at Discharge: Family Type of Home: Apartment Home Access: Level entry Entrance Stairs-Rails: None Entrance Stairs-Number of Steps: 0 Home Layout: One level Home Equipment: Walker - 2 wheels;Cane - single point;Bedside commode;Shower seat Additional Comments: Patient recently at facility for STR; planning to return at discharge to complete course of therapy    Prior Function Level of Independence: Needs assistance         Comments: Sup for basic transfers, household and very  limited community mobility with [facility]; assist from husband for ADLs as needed. Home O2 at 2L     Hand Dominance   Dominant Hand: Right    Extremity/Trunk Assessment   Upper Extremity Assessment Upper Extremity Assessment: Generalized weakness(grossly 4-/5  throughout)    Lower Extremity Assessment Lower Extremity Assessment: Generalized weakness(grossly 3-/5 throughout)       Communication   Communication: HOH  Cognition Arousal/Alertness: Awake/alert Behavior During Therapy: WFL for tasks assessed/performed Overall Cognitive Status: Within Functional Limits for tasks assessed                                        General Comments      Exercises     Assessment/Plan    PT Assessment Patient needs continued PT services  PT Problem List Decreased strength;Decreased activity tolerance;Decreased balance;Decreased mobility;Cardiopulmonary status limiting activity;Decreased safety awareness;Decreased knowledge of precautions;Decreased knowledge of use of DME;Obesity       PT Treatment Interventions DME instruction;Gait training;Functional mobility training;Therapeutic activities;Balance training;Patient/family education;Therapeutic exercise    PT Goals (Current goals can be found in the Care Plan section)  Acute Rehab PT Goals Patient Stated Goal: to get up to the chair PT Goal Formulation: With patient Time For Goal Achievement: 11/12/17 Potential to Achieve Goals: Fair    Frequency Min 2X/week   Barriers to discharge        Co-evaluation               AM-PAC PT "6 Clicks" Daily Activity  Outcome Measure Difficulty turning over in bed (including adjusting bedclothes, sheets and blankets)?: Unable Difficulty moving from lying on back to sitting on the side of the bed? : Unable Difficulty sitting down on and standing up from a chair with arms (e.g., wheelchair, bedside commode, etc,.)?: Unable Help needed moving to and from a bed to chair (including a wheelchair)?: A Little Help needed walking in hospital room?: A Little Help needed climbing 3-5 steps with a railing? : A Lot 6 Click Score: 11    End of Session Equipment Utilized During Treatment: Gait belt;Oxygen Activity Tolerance: Patient  tolerated treatment well Patient left: in chair;with call bell/phone within reach(CNA to apply alarm upon return to room) Nurse Communication: Mobility status PT Visit Diagnosis: Unsteadiness on feet (R26.81);Difficulty in walking, not elsewhere classified (R26.2);Muscle weakness (generalized) (M62.81)    Time: 9518-8416 PT Time Calculation (min) (ACUTE ONLY): 27 min   Charges:   PT Evaluation $PT Eval Moderate Complexity: 1 Mod PT Treatments $Therapeutic Activity: 8-22 mins       Kacey Vicuna H. Owens Shark, PT, DPT, NCS 10/29/17, 9:43 AM [phone]

## 2017-10-29 NOTE — Procedures (Signed)
ELECTROENCEPHALOGRAM REPORT   Patient: Katherine [name]       Room #: 126A-AA EEG No. ID: [id number] Age: 77 y.o.        Sex: female Referring Physician: Mody Report Date:  10/29/2017        Interpreting Physician: Alexis Goodell  History: Katherine [name] is an 77 y.o. female with seizure-like activity  Medications:  Xanax, Lipitor, Omnicef, Plavix, Cardizem, Prozac, Neurontin, Apresoline, Insulin, Keppra, Tradjenta, Solumedrol, Protonix, Senokot  Conditions of Recording:  This is a 21 channel routine scalp EEG performed with bipolar and monopolar montages arranged in accordance to the international 10/20 system of electrode placement. One channel was dedicated to EKG recording.  The patient is in the awake, drowsy and asleep state.  Description:  The waking background activity consists of a low voltage, symmetrical, fairly well organized, 8 Hz alpha activity, seen from the parieto-occipital and posterior temporal regions.  Low voltage fast activity, poorly organized, is seen anteriorly and is at times superimposed on more posterior regions.  A mixture of theta and alpha rhythms are seen from the central and temporal regions. The patient drowses with slowing to irregular, low voltage theta and beta activity.   The patient goes in to a light sleep with symmetrical sleep spindles, vertex central sharp transients and irregular slow activity.   No epileptiform activity is noted.   Hyperventilation was not performed.  Intermittent photic stimulation was performed but failed to illicit any change in the tracing.     IMPRESSION: Normal electroencephalogram, awake, asleep and with activation procedures. There are no focal lateralizing or epileptiform features.   Alexis Goodell, MD Neurology [phone] 10/29/2017, 1:47 PM

## 2017-10-29 NOTE — Progress Notes (Signed)
eeg completed ° °

## 2017-10-29 NOTE — Patient Outreach (Signed)
[facility]) Care Management  10/29/2017  Katherine [name] 1940-04-30 [id number]   Post Acute Care Coordination visit.   This social worker went to visit patient at [facility] today and was told that she was re-admitted to the hospital by discharge planner Donnalee Curry. Per Katherine [name], patient may possibly return to [facility] but this plan has not been confirmed yet by EMR.    Plan: This Education officer, museum will follow up with patient post discharge form the hospital.   Elliot Gurney, [place] Management [phone]

## 2017-10-29 NOTE — Clinical Social Work Note (Signed)
CSW spoke with Otila Kluver, liaison for [facility] regarding patient. Otila Kluver states that they still have not received authorization from [facility] and can not come until that is obtained. CSW notified MD of above. CSW will continue to follow for discharge planning.   [place], [place]

## 2017-10-29 NOTE — Clinical Social Work Note (Signed)
CSW received call from [place] at [facility] stating they have received authorization for patient to come today. Patient is medically ready for discharge. CSW notified patient and husband Bennie Hind [phone] of discharge today. Patient will be transported by EMS. RN to call report and call for transport.   [place], [place]

## 2017-10-29 NOTE — Progress Notes (Addendum)
Subjective: Patient is asleep but is easily awakens to verbal stimuli. She answered to orientation question and prompts without difficulty. No reports of significant events overnight or episode or seizure like activity.  Objective: Current vital signs: BP 90/81 (BP Location: Right Arm)   Pulse 82   Temp 98.3 F (36.8 C) (Oral)   Resp 19   Ht 5' (1.524 m)   Wt 123.9 kg   SpO2 96%   BMI 53.35 kg/m  Vital signs in last 24 hours: Temp:  [97.8 F (36.6 C)-98.3 F (36.8 C)] 98.3 F (36.8 C) (09/16 0444) Pulse Rate:  [71-86] 82 (09/16 0444) Resp:  [19-20] 19 (09/16 0444) BP: (90-142)/(70-94) 90/81 (09/16 0444) SpO2:  [94 %-99 %] 96 % (09/16 0444)  Intake/Output from previous day: 09/15 0701 - 09/16 0700 In: 240 [P.O.:240] Out: 3150 [Urine:3150] Intake/Output this shift: No intake/output data recorded. Nutritional status:  Diet Order            Diet heart healthy/carb modified Room service appropriate? Yes; Fluid consistency: Thin  Diet effective now              Neurologic Exam:  Mental Status: Alert, oriented, thought content appropriate.  Speech fluent without evidence of aphasia.  Able to follow 3 step commands without difficulty.  Slow and husky voice. Cranial Nerves: II: Discs flat bilaterally; Visual fields grossly normal, pupils equal, round, reactive to light and accommodation III,IV, VI: ptosis not present, extra-ocular motions intact bilaterally V,VII: smile symmetric, facial light touch sensation normal bilaterally VIII: hearing normal bilaterally IX,X: gag reflex present XI: bilateral shoulder shrug XII: midline tongue extension Motor: 5/5 in the BUE's with hip flexor weakness bilaterally in the lower extremities likely secondary to habitus  Lab Results: Basic Metabolic Panel: Recent Labs  Lab 10/24/17 0431 10/27/17 0457 10/27/17 1319 10/28/17 0532 10/29/17 0518  NA 140 140 137 138 138  K 4.4 5.8* 5.4* 5.5* 4.5  CL 106 105 103 103 98  CO2 26 27  24 28  34*  GLUCOSE 168* 154* 243* 196* 209*  BUN 24* 23 26* 27* 36*  CREATININE 1.63* 1.55* 1.77* 1.68* 1.77*  CALCIUM 8.0* 9.0 9.0 8.8* 8.7*  MG  --  1.6*  --   --   --   PHOS  --  3.3  --   --   --     Liver Function Tests: Recent Labs  Lab 10/23/17 2313 10/27/17 0457  AST 17 13*  ALT 15 14  ALKPHOS 60 56  BILITOT 0.4 0.6  PROT 6.3* 6.4*  ALBUMIN 2.9* 2.7*   Recent Labs  Lab 10/23/17 2313  LIPASE 33   No results for input(s): AMMONIA in the last 168 hours.  CBC: Recent Labs  Lab 10/23/17 2313 10/24/17 0431 10/27/17 0457  WBC 5.2 4.6 3.1*  NEUTROABS 3.9  --   --   HGB 10.3* 9.1* 9.7*  HCT 32.0* 28.2* 29.8*  MCV 88.3 88.0 87.9  PLT 135* 111* 131*    Cardiac Enzymes: Recent Labs  Lab 10/23/17 2313  TROPONINI <0.03    Lipid Panel: No results for input(s): CHOL, TRIG, HDL, CHOLHDL, VLDL, LDLCALC in the last 168 hours.  CBG: Recent Labs  Lab 10/28/17 0759 10/28/17 1217 10/28/17 1649 10/28/17 2045 10/29/17 0804  GLUCAP 170* 169* 185* 261* 171*    Microbiology: Results for orders placed or performed during the hospital encounter of 10/23/17  Blood Culture (routine x 2)     Status: None   Collection Time: 10/23/17  11:13 PM  Result Value Ref Range Status   Specimen Description BLOOD RIGHT ASSIST CONTROL  Final   Special Requests   Final    BOTTLES DRAWN AEROBIC AND ANAEROBIC Blood Culture results may not be optimal due to an excessive volume of blood received in culture bottles   Culture   Final    NO GROWTH 5 DAYS Performed at [facility], [address] [place], [place] [address]    Report Status 10/28/2017 FINAL  Final  Blood Culture (routine x 2)     Status: None   Collection Time: 10/24/17 12:08 AM  Result Value Ref Range Status   Specimen Description BLOOD BLOOD RIGHT WRIST  Final   Special Requests   Final    BOTTLES DRAWN AEROBIC AND ANAEROBIC Blood Culture results may not be optimal due to an excessive volume of blood  received in culture bottles   Culture   Final    NO GROWTH 5 DAYS Performed at [facility], [address] [place], [place] [address]    Report Status 10/29/2017 FINAL  Final  MRSA PCR Screening     Status: None   Collection Time: 10/26/17  6:54 PM  Result Value Ref Range Status   MRSA by PCR NEGATIVE NEGATIVE Final    Comment:        The GeneXpert MRSA Assay (FDA approved for NASAL specimens only), is one component of a comprehensive MRSA colonization surveillance program. It is not intended to diagnose MRSA infection nor to guide or monitor treatment for MRSA infections. Performed at [facility], [address] [place], [place] [address]     Coagulation Studies: No results for input(s): LABPROT, INR in the last 72 hours.  Imaging: Dg Chest 1 View  Result Date: 10/28/2017 CLINICAL DATA:  Possible seizure, altered mental status yesterday, back to baseline EXAM: CHEST  1 VIEW COMPARISON:  10/26/2017 FINDINGS: Mild lingular and bibasilar opacities, likely atelectasis. No focal consolidation. Survey fusion The heart is mildly enlarged. IMPRESSION: Mild lingular and bibasilar opacities, likely atelectasis. Electronically Signed   By: Julian Hy M.D.   On: 10/28/2017 11:49   US Venous Img Lower Unilateral Right  Result Date: 10/27/2017 CLINICAL DATA:  Right lower extremity pain and edema. History of smoking. Evaluate for DVT. EXAM: RIGHT LOWER EXTREMITY VENOUS DOPPLER ULTRASOUND TECHNIQUE: Gray-scale sonography with graded compression, as well as color Doppler and duplex ultrasound were performed to evaluate the lower extremity deep venous systems from the level of the common femoral vein and including the common femoral, femoral, profunda femoral, popliteal and calf veins including the posterior tibial, peroneal and gastrocnemius veins when visible. The superficial great saphenous vein was also interrogated. Spectral Doppler was utilized to evaluate flow  at rest and with distal augmentation maneuvers in the common femoral, femoral and popliteal veins. COMPARISON:  None. FINDINGS: Examination is degraded due to patient body habitus and poor sonographic window. Contralateral Common Femoral Vein: Respiratory phasicity is normal and symmetric with the symptomatic side. No evidence of thrombus. Normal compressibility. Common Femoral Vein: No evidence of thrombus. Normal compressibility, respiratory phasicity and response to augmentation. Saphenofemoral Junction: No evidence of thrombus. Normal compressibility and flow on color Doppler imaging. Profunda Femoral Vein: No evidence of thrombus. Normal compressibility and flow on color Doppler imaging. Femoral Vein: No evidence of thrombus. Normal compressibility, respiratory phasicity and response to augmentation. Popliteal Vein: No evidence of thrombus. Normal compressibility, respiratory phasicity and response to augmentation. Calf Veins: No evidence of thrombus.  Normal compressibility and flow on color Doppler imaging. Superficial Great Saphenous Vein: No evidence of thrombus. Normal compressibility. Venous Reflux:  None. Other Findings:  None. IMPRESSION: No evidence of DVT within the right lower extremity. Electronically Signed   By: Sandi Mariscal M.D.   On: 10/27/2017 14:08    Medications:  I have reviewed the patient's current medications. Prior to Admission:  Medications Prior to Admission  Medication Sig Dispense Refill Last Dose  . acetaminophen (TYLENOL) 650 MG CR tablet Take 650-1,300 mg by mouth every 8 (eight) hours as needed for pain.   prn at prn  . albuterol (ACCUNEB) 0.63 MG/3ML nebulizer solution Take 3 mLs by nebulization 3 (three) times daily as needed for wheezing.   prn at prn  . albuterol (PROVENTIL HFA;VENTOLIN HFA) 108 (90 Base) MCG/ACT inhaler Inhale 2 puffs into the lungs every 4 (four) hours as needed for wheezing or shortness of breath.   prn at prn  . ALPRAZolam (XANAX) 0.5 MG tablet  Take 0.5 mg by mouth 2 (two) times daily as needed for anxiety.   prn at prn  . atorvastatin (LIPITOR) 80 MG tablet Take 80 mg by mouth daily.   unknown at unknown  . cetirizine (ZYRTEC) 10 MG tablet Take 5 mg by mouth at bedtime.   unknown at unknown  . cholecalciferol (VITAMIN D) 1000 units tablet Take 1,000 Units by mouth daily.   unknown at unknown  . clopidogrel (PLAVIX) 75 MG tablet Take 75 mg by mouth daily.   unknown at unknown  . diltiazem (CARDIZEM SR) 120 MG 12 hr capsule Take 120 mg by mouth 2 (two) times daily.   unknown at unknown  . docusate sodium (COLACE) 100 MG capsule Take 100 mg by mouth daily.   unknown at unknown  . FLUoxetine (PROZAC) 10 MG capsule Take 10 mg by mouth daily.   unknown at unknown  . Fluticasone-Umeclidin-Vilant 100-62.5-25 MCG/INH AEPB Inhale 1 puff into the lungs daily.   unknown at unknown  . gabapentin (NEURONTIN) 300 MG capsule Take 300 mg by mouth at bedtime.   unknown at unknown  . hydrALAZINE (APRESOLINE) 25 MG tablet Take 25 mg by mouth 3 (three) times daily.   unknown at unknown  . ipratropium (ATROVENT) 0.03 % nasal spray Place 2 sprays into both nostrils every 12 (twelve) hours.   unknown at unknown  . ipratropium-albuterol (DUONEB) 0.5-2.5 (3) MG/3ML SOLN Take 3 mLs by nebulization every 6 (six) hours as needed (shortness of breath).   prn at prn  . levETIRAcetam (KEPPRA XR) 500 MG 24 hr tablet Take 2 tablets (1,000 mg total) by mouth daily. 30 tablet 11 unknown at unknown  . linagliptin (TRADJENTA) 5 MG TABS tablet Take 1 tablet (5 mg total) by mouth daily. 30 tablet 2 unknown at unknown  . meclizine (ANTIVERT) 25 MG tablet Take 25 mg by mouth 2 (two) times daily as needed for dizziness.   prn at prn  . methocarbamol (ROBAXIN) 500 MG tablet Take 500 mg by mouth every 8 (eight) hours as needed for muscle spasms.   prn at prn  . pantoprazole (PROTONIX) 40 MG tablet Take 40 mg by mouth daily.   unknown at unknown  . tiotropium (SPIRIVA) 18 MCG  inhalation capsule Place 18 mcg into inhaler and inhale daily.   unknown at unknown   Scheduled: . ALPRAZolam  0.5 mg Oral BID  . atorvastatin  80 mg Oral Daily  . cefdinir  300 mg Oral Q12H  . clopidogrel  75 mg Oral Daily  . diltiazem  120 mg Oral BID  . FLUoxetine  10 mg Oral Daily  . fluticasone furoate-vilanterol  1 puff Inhalation Daily   And  . umeclidinium bromide  1 puff Inhalation Daily  . gabapentin  300 mg Oral QHS  . heparin  5,000 Units Subcutaneous Q8H  . hydrALAZINE  25 mg Oral TID  . insulin aspart  0-5 Units Subcutaneous QHS  . insulin aspart  0-9 Units Subcutaneous TID WC  . levETIRAcetam  1,000 mg Oral Daily  . linagliptin  5 mg Oral Daily  . mouth rinse  15 mL Mouth Rinse BID  . methylPREDNISolone (SOLU-MEDROL) injection  40 mg Intravenous Q12H  . pantoprazole  40 mg Oral Daily  . senna-docusate  1 tablet Oral BID    Patient seen and examined.  Clinical course and management discussed.  Necessary edits performed.  I agree with the above.  Assessment and plan of care developed and discussed below.   Assessment: 77 year old female presenting with seizure like activity in the setting sepsis now improving. No further seizure like activity noted. Neurological examination is nonfocal.  EEG unremarkable.     Recommendations: 1. Continue Xanax BID 2. Continue Keppra at 1000mg  daily  This patient was staffed with Dr. Magda Paganini, Doy Mince who personally evaluated patient, reviewed documentation and agreed with assessment and plan of care as above.  Rufina Falco, DNP, FNP-BC Board certified Nurse Practitioner Neurology Department   LOS: 5 days    10/29/2017  8:55 AM  No further neurologic intervention is recommended at this time.  If further questions arise, please call or page at that time.  Thank you for allowing neurology to participate in the care of this patient.   Alexis Goodell, MD Neurology [phone]  10/29/2017  2:38 PM

## 2017-10-29 NOTE — Progress Notes (Signed)
Bipap refused 

## 2017-10-29 NOTE — Progress Notes (Signed)
Called [facility] and gave report to Alfonzo Feller LPN

## 2017-10-29 NOTE — Evaluation (Signed)
Clinical/Bedside Swallow Evaluation Patient Details  Name: Katherine [name] MRN: [id number] Date of Birth: May 13, 1940  Today's Date: 10/29/2017 Time: SLP Start Time (ACUTE ONLY): 0935 SLP Stop Time (ACUTE ONLY): 1030 SLP Time Calculation (min) (ACUTE ONLY): 55 min  Past Medical History:  Past Medical History:  Diagnosis Date  . Angioedema   . Anxiety   . Anxiety and depression   . Chronic constipation   . Chronic kidney disease    stage 3  . COPD (chronic obstructive pulmonary disease) ([place])   . Gallstones   . GERD (gastroesophageal reflux disease)   . Hypertension   . Left ventricular hypertrophy   . Osteoarthritis   . Prurigo nodularis   . Tobacco abuse   . Vitamin D deficiency disease    Past Surgical History:  Past Surgical History:  Procedure Laterality Date  . CHOLECYSTECTOMY    . 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   Procedure: TOTAL KNEE ARTHROPLASTY;  Surgeon: Hessie Knows, MD;  Location: [facility] ORS;  Service: Orthopedics;  Laterality: Right;   HPI:  Pt is a 77 year old female admitted with worsening fatigue, SOB; admitted with sepsis related to HCAP.  Hospital course additionally significant for acute metabolic encephalopathy and possible seizure episode (involving rapid response and transfer to CCU).  Pt had a recent admission for acute on chronic respiratory failure from COPD exacerbation. MRI then showed no acute changes. PMH: COPD, chronic renal disease, hypertension, GERD, anxiety, HTN. Unsure of pt's baseline Cognitive status at home. Husband is present assisting w/ all ADLs.    Assessment / Plan / Recommendation Clinical Impression  Pt appears to present w/ grossly adequate oropharyngeal phase swallow function; appears at her baseline as per recent assessment in past ~4 months. She is EDENTULOUS and has COPD which can impact swallowing/breathing timing AND ability to effectively masticate/chew any foods she eats -  especially meats, breads(she does not wear dentures). Pt consumed trials of thin liquids via Cup/Straw then purees and solid trials w/ no overt s/s of aspiration; no decline in vocal quality or respiratory presentation during/post trials. During the Oral phase, pt needed min increased time for full mastication to effectively chew/mash the solid boluses for A-P transfer/swallow. Given increased time, pt was able to accomplish swallowing/clearing of solid bolus trials. Pt appeared to benefit from taking a small sip of liquid with or between the trials to aid clearing. Pt also required rest breaks as well d/t min increased SOB w/ any exertion of the tasks of eating/drinking. This is important: pt must NOT get SOB during oral intake or the risk for choking/aspiration will increase. Pt fed self given setup. Of note, pt c/o GI DISCOMFORT and stated the food and liquids "TRY TO COME BACK UP SOMETIMES". Noted BELCHING post trials. Pt pointed to her MID-STERNUM AREA indicing discomfort. Recommend a Dysphagia level 3 (mech soft w/ chopped meats d/t edentulous status and GI dysomotlity complaints) w/ Thin liquids. Moisten foods well. General aspiration precautions, Reflux precautions d/t GERD dx. Recommend Pills w/ Puree for safer swallowing if needed. Reduce distractions during meals; Rest Breaks during meals. Per recent Cognitive-linguistic eval last admission in 09/2017 at another facility, pt presented w/ reduced sustained attention, basic to mildly complex comprehension, confrontational and divergent naming, and intellectual awareness per the assessment. Pt would benefit from Monitoring at all meals for follow through cues w/ strategies/precautions/foods. SLP Visit Diagnosis: Dysphagia, oral phase (R13.11)(min d/t Edentulous status)    Aspiration Risk  (reduced following  aspiration precautions; rest breaks - SOB)    Diet Recommendation  Mech Soft diet consistency d/t Edentulous status and GERD/Dysmotiliyt; Thin  liquids. General aspiration precautions; GERD precautions  Medication Administration: Whole meds with puree(if needed for better Esophageal clearing)    Other  Recommendations Recommended Consults: Consider GI evaluation;Consider esophageal assessment(post D/C d/t c/o dysmotility) Oral Care Recommendations: Oral care BID;Patient independent with oral care Other Recommendations: (n/a)   Follow up Recommendations None      Frequency and Duration (n/a)  (n/a)       Prognosis Prognosis for Safe Diet Advancement: Fair(-Good) Barriers to Reach Goals: Cognitive deficits;Time post onset;Severity of deficits(baseline GI dysmotility complaints)      Swallow Study   General Date of Onset: 10/23/17 HPI: Pt is a 77 year old female admitted with worsening fatigue, SOB; admitted with sepsis related to HCAP.  Hospital course additionally significant for acute metabolic encephalopathy and possible seizure episode (involving rapid response and transfer to CCU).  Pt had a recent admission for acute on chronic respiratory failure from COPD exacerbation. MRI then showed no acute changes. PMH: COPD, chronic renal disease, hypertension, GERD, anxiety, HTN. Unsure of pt's baseline Cognitive status at home. Husband is present assisting w/ all ADLs.  Type of Study: Bedside Swallow Evaluation Previous Swallow Assessment: 06/2017; 09/2017 - no further needs indicated both evals Diet Prior to this Study: Dysphagia 3 (soft);Thin liquids(recommended post previous evals) Temperature Spikes Noted: No(wbc 3.1) Respiratory Status: Nasal cannula(3 liters; had required BiPAP briefly ) History of Recent Intubation: No Behavior/Cognition: Alert;Cooperative;Pleasant mood;Distractible;Requires cueing Oral Cavity Assessment: Within Functional Limits Oral Care Completed by SLP: Recent completion by staff Oral Cavity - Dentition: Edentulous Vision: Functional for self-feeding Self-Feeding Abilities: Able to feed self;Needs set  up Patient Positioning: Upright in chair Baseline Vocal Quality: Normal Volitional Cough: Strong Volitional Swallow: Able to elicit    Oral/Motor/Sensory Function Overall Oral Motor/Sensory Function: Within functional limits   Ice Chips Ice chips: Not tested   Thin Liquid Thin Liquid: Within functional limits Presentation: Cup;Self Fed;Straw(~5 ozs)    Nectar Thick Nectar Thick Liquid: Not tested   Honey Thick Honey Thick Liquid: Not tested   Puree Puree: Within functional limits Presentation: Self Fed;Spoon(4 trials)   Solid     Solid: Impaired(mech soft trials) Presentation: Self Fed(5 trials) Oral Phase Impairments: Impaired mastication(lacks dentition) Oral Phase Functional Implications: Impaired mastication(mashing, gumming on trials to break down) Pharyngeal Phase Impairments: (none)       Orinda Kenner, MS, CCC-SLP Junice Fei 10/29/2017,10:46 AM

## 2017-10-29 NOTE — Care Management Important Message (Signed)
Copy of signed IM left with patient in room.  

## 2017-10-29 NOTE — Plan of Care (Signed)
  Problem: Fluid Volume: Goal: Hemodynamic stability will improve Outcome: Progressing   Problem: Clinical Measurements: Goal: Diagnostic test results will improve Outcome: Progressing Goal: Signs and symptoms of infection will decrease Outcome: Progressing   Problem: Respiratory: Goal: Ability to maintain adequate ventilation will improve Outcome: Progressing   Problem: Respiratory: Goal: Ability to maintain adequate ventilation will improve Outcome: Progressing Goal: Ability to maintain a clear airway will improve Outcome: Progressing   Problem: Education: Goal: Knowledge of General Education information will improve Description Including pain rating scale, medication(s)/side effects and non-pharmacologic comfort measures Outcome: Progressing   Problem: Health Behavior/Discharge Planning: Goal: Ability to manage health-related needs will improve Outcome: Progressing   Problem: Clinical Measurements: Goal: Ability to maintain clinical measurements within normal limits will improve Outcome: Progressing   Problem: Activity: Goal: Risk for activity intolerance will decrease Outcome: Progressing   Problem: Safety: Goal: Ability to remain free from injury will improve Outcome: Progressing   Problem: Skin Integrity: Goal: Risk for impaired skin integrity will decrease Outcome: Progressing

## 2017-10-29 NOTE — Discharge Summary (Addendum)
[facility] NAME: Elka Satterfield    MR#:  [id number]  DATE OF BIRTH:  10-13-1940  DATE OF ADMISSION:  10/23/2017 ADMITTING PHYSICIAN: Lance Coon, MD  DATE OF DISCHARGE: 10/29/2017  PRIMARY CARE PHYSICIAN: Valerie Roys, DO    ADMISSION DIAGNOSIS:  HCAP (healthcare-associated pneumonia) [J18.9]  DISCHARGE DIAGNOSIS:  Principal Problem:   Sepsis ([place]) Active Problems:   Essential hypertension   Anxiety   GERD (gastroesophageal reflux disease)   COPD (chronic obstructive pulmonary disease) ([facility])   Coronary artery disease   OSA and COPD overlap syndrome (H[facility])   Acute on chronic respiratory failure with hypoxia and hypercapnia ([facility])   HCAP (healthcare-associated pneumonia)   Acute kidney injury superimposed on CKD ([facility])   Severe sepsis ([place])   SECONDARY DIAGNOSIS:   Past Medical History:  Diagnosis Date  . Angioedema   . Anxiety   . Anxiety and depression   . Chronic constipation   . Chronic kidney disease    stage 3  . COPD (chronic obstructive pulmonary disease) ([place])   . Gallstones   . GERD (gastroesophageal reflux disease)   . Hypertension   . Left ventricular hypertrophy   . Osteoarthritis   . Prurigo nodularis   . Tobacco abuse   . Vitamin D deficiency disease     HOSPITAL COURSE:   77 year old female patient with history of chronic kidney disease stage III, COPD, GERD, hypertension, osteoarthritis who presented to the hospital due to shortness of breath.  1.  Sepsis: Sepsis was due to to pneumonia and has resolved. She will continue Cefdinir for 2 more days.   Cultures are negative.    2.  Healthcare associated pneumonia: Continue oral antibiotics  3.  Acute metabolic encephalopathy: Patient underwent CT scan on September 13 which was negative Her symptoms resolved quickly. This was not due to  TIA or seizure activity Neurology consultation was appreciated.  4.  Acute on chronic hypoxic  respiratory failure in the setting of pneumonia mild COPD exacerbation. He has been weaned to baseline oxygen of  2 to 3 L. We will O2 saturations greater than 90%.   Echo cardiogram 2019 August shows normal ejection fraction and no diastolic dysfunction. She will be treated for pneumonia and mild COPD exacerbation with antibiotics and prednisone. She would also benefit from the use of incentive spirometer  5.  Diabetes: Continue current regimen and ADA diet 6.  Chronic kidney disease stage III: Creatinine is at baseline  7.  COPD mild signs of exacerbation today: She will be discharged on oral prednisone for 4 more days. Continue inhaler nebs  8.  Hyperkalemia: Treated with Kayexalate, insulin and dextrose   DISCHARGE CONDITIONS AND DIET:   Stable for discharge on diabetic heart healthy diet Mech Soft diet consistency d/t Edentulous status and GERD/Dysmotiliyt; Thin liquids. General aspiration precautions; GERD precautions  Medication Administration: Whole meds with puree(if needed for better Esophageal clearing)   CONSULTS OBTAINED:  Treatment Team:  Alexis Goodell, MD  DRUG ALLERGIES:   Allergies  Allergen Reactions  . Bee Venom Swelling  . Enalapril Maleate Swelling    DISCHARGE MEDICATIONS:   Allergies as of 10/29/2017      Reactions   Bee Venom Swelling   Enalapril Maleate Swelling      Medication List    TAKE these medications   acetaminophen 650 MG CR tablet Commonly known as:  TYLENOL Take 650-1,300 mg by mouth every 8 (eight) hours as needed for  pain.   albuterol 108 (90 Base) MCG/ACT inhaler Commonly known as:  PROVENTIL HFA;VENTOLIN HFA Inhale 2 puffs into the lungs every 4 (four) hours as needed for wheezing or shortness of breath.   albuterol 0.63 MG/3ML nebulizer solution Commonly known as:  ACCUNEB Take 3 mLs by nebulization 3 (three) times daily as needed for wheezing.   ALPRAZolam 0.5 MG tablet Commonly known as:  XANAX Take 0.5 mg by  mouth 2 (two) times daily as needed for anxiety.   atorvastatin 80 MG tablet Commonly known as:  LIPITOR Take 80 mg by mouth daily.   cefdinir 300 MG capsule Commonly known as:  OMNICEF Take 1 capsule (300 mg total) by mouth every 12 (twelve) hours for 2 days.   cetirizine 10 MG tablet Commonly known as:  ZYRTEC Take 5 mg by mouth at bedtime. What changed:  Another medication with the same name was added. Make sure you understand how and when to take each.   cetirizine 10 MG tablet Commonly known as:  ZYRTEC TAKE 1/2 TABLET(5 MG) BY MOUTH AT BEDTIME What changed:  You were already taking a medication with the same name, and this prescription was added. Make sure you understand how and when to take each.   cholecalciferol 1000 units tablet Commonly known as:  VITAMIN D Take 1,000 Units by mouth daily.   clopidogrel 75 MG tablet Commonly known as:  PLAVIX Take 75 mg by mouth daily.   diltiazem 120 MG 12 hr capsule Commonly known as:  CARDIZEM SR Take 120 mg by mouth 2 (two) times daily.   docusate sodium 100 MG capsule Commonly known as:  COLACE Take 100 mg by mouth daily.   FLUoxetine 10 MG capsule Commonly known as:  PROZAC Take 10 mg by mouth daily.   Fluticasone-Umeclidin-Vilant 100-62.5-25 MCG/INH Aepb Inhale 1 puff into the lungs daily.   gabapentin 300 MG capsule Commonly known as:  NEURONTIN Take 300 mg by mouth at bedtime.   hydrALAZINE 25 MG tablet Commonly known as:  APRESOLINE Take 25 mg by mouth 3 (three) times daily.   ipratropium 0.03 % nasal spray Commonly known as:  ATROVENT Place 2 sprays into both nostrils every 12 (twelve) hours.   ipratropium-albuterol 0.5-2.5 (3) MG/3ML Soln Commonly known as:  DUONEB Take 3 mLs by nebulization every 6 (six) hours as needed (shortness of breath).   levETIRAcetam 500 MG 24 hr tablet Commonly known as:  KEPPRA XR Take 2 tablets (1,000 mg total) by mouth daily.   linagliptin 5 MG Tabs tablet Commonly  known as:  TRADJENTA Take 1 tablet (5 mg total) by mouth daily.   meclizine 25 MG tablet Commonly known as:  ANTIVERT Take 25 mg by mouth 2 (two) times daily as needed for dizziness.   methocarbamol 500 MG tablet Commonly known as:  ROBAXIN Take 500 mg by mouth every 8 (eight) hours as needed for muscle spasms.   pantoprazole 40 MG tablet Commonly known as:  PROTONIX Take 40 mg by mouth daily.   predniSONE 10 MG tablet Commonly known as:  DELTASONE Take 4 tablets (40 mg total) by mouth daily with breakfast for 4 days.   tiotropium 18 MCG inhalation capsule Commonly known as:  SPIRIVA Place 18 mcg into inhaler and inhale daily.         Today   CHIEF COMPLAINT:   No acute events reported overnight   VITAL SIGNS:  Blood pressure 90/81, pulse 82, temperature 98.3 F (36.8 C), temperature source Oral, resp.  rate 19, height 5' (1.524 m), weight 123.9 kg, SpO2 96 %.   REVIEW OF SYSTEMS:  Review of Systems  Constitutional: Negative.  Negative for chills, fever and malaise/fatigue.  HENT: Negative.  Negative for ear discharge, ear pain, hearing loss, nosebleeds and sore throat.   Eyes: Negative.  Negative for blurred vision and pain.  Respiratory: Negative.  Negative for cough, hemoptysis, shortness of breath and wheezing.   Cardiovascular: Negative.  Negative for chest pain, palpitations and leg swelling.  Gastrointestinal: Negative.  Negative for abdominal pain, blood in stool, diarrhea, nausea and vomiting.  Genitourinary: Negative.  Negative for dysuria.  Musculoskeletal: Negative.  Negative for back pain.  Skin: Negative.   Neurological: Negative for dizziness, tremors, speech change, focal weakness, seizures and headaches.  Endo/Heme/Allergies: Negative.  Does not bruise/bleed easily.  Psychiatric/Behavioral: Negative.  Negative for depression, hallucinations and suicidal ideas.     PHYSICAL EXAMINATION:  GENERAL:  77 y.o.-year-old patient lying in the bed with  no acute distress.  Obese NECK:  Supple, no jugular venous distention. No thyroid enlargement, no tenderness.  LUNGS: Normal breath sounds bilaterally, no wheezing, rales,rhonchi  No use of accessory muscles of respiration.  CARDIOVASCULAR: S1, S2 normal. No murmurs, rubs, or gallops.  ABDOMEN: Soft, non-tender, non-distended. Bowel sounds present. No organomegaly or mass.  EXTREMITIES: No pedal edema, cyanosis, or clubbing.  PSYCHIATRIC: The patient is alert and oriented x 3.  SKIN: No obvious rash, lesion, or ulcer.   DATA REVIEW:   CBC Recent Labs  Lab 10/27/17 0457  WBC 3.1*  HGB 9.7*  HCT 29.8*  PLT 131*    Chemistries  Recent Labs  Lab 10/27/17 0457  10/29/17 0518  NA 140   < > 138  K 5.8*   < > 4.5  CL 105   < > 98  CO2 27   < > 34*  GLUCOSE 154*   < > 209*  BUN 23   < > 36*  CREATININE 1.55*   < > 1.77*  CALCIUM 9.0   < > 8.7*  MG 1.6*  --   --   AST 13*  --   --   ALT 14  --   --   ALKPHOS 56  --   --   BILITOT 0.6  --   --    < > = values in this interval not displayed.    Cardiac Enzymes Recent Labs  Lab 10/23/17 2313  TROPONINI <0.03    Microbiology Results  @[id number] @  RADIOLOGY:  Dg Chest 1 View  Result Date: 10/28/2017 CLINICAL DATA:  Possible seizure, altered mental status yesterday, back to baseline EXAM: CHEST  1 VIEW COMPARISON:  10/26/2017 FINDINGS: Mild lingular and bibasilar opacities, likely atelectasis. No focal consolidation. Survey fusion The heart is mildly enlarged. IMPRESSION: Mild lingular and bibasilar opacities, likely atelectasis. Electronically Signed   By: Julian Hy M.D.   On: 10/28/2017 11:49   US Venous Img Lower Unilateral Right  Result Date: 10/27/2017 CLINICAL DATA:  Right lower extremity pain and edema. History of smoking. Evaluate for DVT. EXAM: RIGHT LOWER EXTREMITY VENOUS DOPPLER ULTRASOUND TECHNIQUE: Gray-scale sonography with graded compression, as well as color Doppler and duplex ultrasound were  performed to evaluate the lower extremity deep venous systems from the level of the common femoral vein and including the common femoral, femoral, profunda femoral, popliteal and calf veins including the posterior tibial, peroneal and gastrocnemius veins when visible. The superficial great saphenous vein was also interrogated. Spectral Doppler  was utilized to evaluate flow at rest and with distal augmentation maneuvers in the common femoral, femoral and popliteal veins. COMPARISON:  None. FINDINGS: Examination is degraded due to patient body habitus and poor sonographic window. Contralateral Common Femoral Vein: Respiratory phasicity is normal and symmetric with the symptomatic side. No evidence of thrombus. Normal compressibility. Common Femoral Vein: No evidence of thrombus. Normal compressibility, respiratory phasicity and response to augmentation. Saphenofemoral Junction: No evidence of thrombus. Normal compressibility and flow on color Doppler imaging. Profunda Femoral Vein: No evidence of thrombus. Normal compressibility and flow on color Doppler imaging. Femoral Vein: No evidence of thrombus. Normal compressibility, respiratory phasicity and response to augmentation. Popliteal Vein: No evidence of thrombus. Normal compressibility, respiratory phasicity and response to augmentation. Calf Veins: No evidence of thrombus. Normal compressibility and flow on color Doppler imaging. Superficial Great Saphenous Vein: No evidence of thrombus. Normal compressibility. Venous Reflux:  None. Other Findings:  None. IMPRESSION: No evidence of DVT within the right lower extremity. Electronically Signed   By: Sandi Mariscal M.D.   On: 10/27/2017 14:08      Allergies as of 10/29/2017      Reactions   Bee Venom Swelling   Enalapril Maleate Swelling      Medication List    TAKE these medications   acetaminophen 650 MG CR tablet Commonly known as:  TYLENOL Take 650-1,300 mg by mouth every 8 (eight) hours as needed for  pain.   albuterol 108 (90 Base) MCG/ACT inhaler Commonly known as:  PROVENTIL HFA;VENTOLIN HFA Inhale 2 puffs into the lungs every 4 (four) hours as needed for wheezing or shortness of breath.   albuterol 0.63 MG/3ML nebulizer solution Commonly known as:  ACCUNEB Take 3 mLs by nebulization 3 (three) times daily as needed for wheezing.   ALPRAZolam 0.5 MG tablet Commonly known as:  XANAX Take 0.5 mg by mouth 2 (two) times daily as needed for anxiety.   atorvastatin 80 MG tablet Commonly known as:  LIPITOR Take 80 mg by mouth daily.   cefdinir 300 MG capsule Commonly known as:  OMNICEF Take 1 capsule (300 mg total) by mouth every 12 (twelve) hours for 2 days.   cetirizine 10 MG tablet Commonly known as:  ZYRTEC Take 5 mg by mouth at bedtime. What changed:  Another medication with the same name was added. Make sure you understand how and when to take each.   cetirizine 10 MG tablet Commonly known as:  ZYRTEC TAKE 1/2 TABLET(5 MG) BY MOUTH AT BEDTIME What changed:  You were already taking a medication with the same name, and this prescription was added. Make sure you understand how and when to take each.   cholecalciferol 1000 units tablet Commonly known as:  VITAMIN D Take 1,000 Units by mouth daily.   clopidogrel 75 MG tablet Commonly known as:  PLAVIX Take 75 mg by mouth daily.   diltiazem 120 MG 12 hr capsule Commonly known as:  CARDIZEM SR Take 120 mg by mouth 2 (two) times daily.   docusate sodium 100 MG capsule Commonly known as:  COLACE Take 100 mg by mouth daily.   FLUoxetine 10 MG capsule Commonly known as:  PROZAC Take 10 mg by mouth daily.   Fluticasone-Umeclidin-Vilant 100-62.5-25 MCG/INH Aepb Inhale 1 puff into the lungs daily.   gabapentin 300 MG capsule Commonly known as:  NEURONTIN Take 300 mg by mouth at bedtime.   hydrALAZINE 25 MG tablet Commonly known as:  APRESOLINE Take 25 mg by mouth 3 (  three) times daily.   ipratropium 0.03 % nasal  spray Commonly known as:  ATROVENT Place 2 sprays into both nostrils every 12 (twelve) hours.   ipratropium-albuterol 0.5-2.5 (3) MG/3ML Soln Commonly known as:  DUONEB Take 3 mLs by nebulization every 6 (six) hours as needed (shortness of breath).   levETIRAcetam 500 MG 24 hr tablet Commonly known as:  KEPPRA XR Take 2 tablets (1,000 mg total) by mouth daily.   linagliptin 5 MG Tabs tablet Commonly known as:  TRADJENTA Take 1 tablet (5 mg total) by mouth daily.   meclizine 25 MG tablet Commonly known as:  ANTIVERT Take 25 mg by mouth 2 (two) times daily as needed for dizziness.   methocarbamol 500 MG tablet Commonly known as:  ROBAXIN Take 500 mg by mouth every 8 (eight) hours as needed for muscle spasms.   pantoprazole 40 MG tablet Commonly known as:  PROTONIX Take 40 mg by mouth daily.   predniSONE 10 MG tablet Commonly known as:  DELTASONE Take 4 tablets (40 mg total) by mouth daily with breakfast for 4 days.   tiotropium 18 MCG inhalation capsule Commonly known as:  SPIRIVA Place 18 mcg into inhaler and inhale daily.          Management plans discussed with the patient and she is in agreement. Stable for discharge   Patient should follow up with pcp  CODE STATUS:     Code Status Orders  (From admission, onward)         Start     Ordered   10/24/17 0247  Full code  Continuous     10/24/17 0246        Code Status History    Date Active Date Inactive Code Status Order ID Comments User Context   10/04/2017 1005 10/10/2017 1813 Full Code [id number]  Estill Bakes Inpatient   10/01/2017 0819 10/04/2017 0904 Full Code [id number]  Lolita Cram, RN Inpatient   09/19/2017 1306 09/22/2017 1622 Full Code [id number]  Gorden Harms, MD Inpatient   09/11/2017 2021 09/15/2017 1510 Full Code [id number]  Gorden Harms, MD Inpatient   08/27/2017 1543 08/29/2017 2159 Full Code [id number]  Hillary Bow, MD ED   06/28/2017 1414 06/30/2017 1748 Full Code [id number]   Salary, Avel Peace, MD Inpatient   06/12/2017 0858 06/16/2017 1659 DNR [id number]  Demetrios Loll, MD Inpatient   05/26/2017 1732 05/30/2017 1446 DNR [id number]  Hillary Bow, MD ED   05/17/2017 1228 05/21/2017 1511 DNR [id number]  Asencion Gowda, NP Inpatient   04/21/2017 0117 04/22/2017 1750 DNR [id number]  Lance Coon, MD Inpatient   04/05/2017 1411 04/08/2017 2101 Full Code [id number]  Saundra Shelling, MD Inpatient   02/08/2016 1414 02/11/2016 1729 Full Code [id number]  Hessie Knows, MD Inpatient   11/27/2015 1645 11/29/2015 1628 Full Code [id number]  Idelle Crouch, MD Inpatient   02/10/2015 2122 02/12/2015 1809 Full Code [id number]  Lytle Butte, MD ED   09/16/2014 1220 09/19/2014 1736 Full Code [id number]  Aldean Jewett, MD Inpatient      TOTAL TIME TAKING CARE OF THIS PATIENT: 38 minutes.    Note: This dictation was prepared with Dragon dictation along with smaller phrase technology. Any transcriptional errors that result from this process are unintentional.  Shiro Ellerman M.D on 10/29/2017 at 8:47 AM  Between 7am to 6pm - Pager - [phone] After 6pm go to [email or web address] - password [email or web address] [facility]  Office  [phone]  CC: Primary care physician;  Johnson, Megan P, DO

## 2017-10-30 ENCOUNTER — Ambulatory Visit (HOSPITAL_COMMUNITY): Payer: Medicare HMO

## 2017-11-01 DIAGNOSIS — E785 Hyperlipidemia, unspecified: Secondary | ICD-10-CM | POA: Diagnosis not present

## 2017-11-01 DIAGNOSIS — G459 Transient cerebral ischemic attack, unspecified: Secondary | ICD-10-CM | POA: Diagnosis not present

## 2017-11-01 DIAGNOSIS — E119 Type 2 diabetes mellitus without complications: Secondary | ICD-10-CM | POA: Diagnosis not present

## 2017-11-01 DIAGNOSIS — I10 Essential (primary) hypertension: Secondary | ICD-10-CM | POA: Diagnosis not present

## 2017-11-01 DIAGNOSIS — J449 Chronic obstructive pulmonary disease, unspecified: Secondary | ICD-10-CM | POA: Diagnosis not present

## 2017-11-01 DIAGNOSIS — K219 Gastro-esophageal reflux disease without esophagitis: Secondary | ICD-10-CM | POA: Diagnosis not present

## 2017-11-02 ENCOUNTER — Other Ambulatory Visit: Payer: Self-pay | Admitting: *Deleted

## 2017-11-02 NOTE — Patient Outreach (Signed)
[facility]) Care Management  11/02/2017  Bana Borgmeyer 02/15/1940 [id number]   Post Acute Care Coordination call Phone call to [facility], Donnalee Curry to follow up on patient upon her return form the hospital. Voicemail message for a return call.   Sheralyn Boatman [facility] Care Management [phone]

## 2017-11-06 ENCOUNTER — Other Ambulatory Visit: Payer: Self-pay

## 2017-11-06 ENCOUNTER — Encounter: Payer: Self-pay | Admitting: Family Medicine

## 2017-11-06 ENCOUNTER — Other Ambulatory Visit: Payer: Self-pay | Admitting: *Deleted

## 2017-11-06 ENCOUNTER — Ambulatory Visit (INDEPENDENT_AMBULATORY_CARE_PROVIDER_SITE_OTHER): Payer: Medicare HMO | Admitting: Family Medicine

## 2017-11-06 VITALS — BP 112/77 | HR 78 | Temp 98.6°F | Ht 60.0 in

## 2017-11-06 DIAGNOSIS — J849 Interstitial pulmonary disease, unspecified: Secondary | ICD-10-CM

## 2017-11-06 NOTE — Progress Notes (Signed)
BP 112/77   Pulse 78   Temp 98.6 F (37 C) (Tympanic)   Ht 5' (1.524 m)   SpO2 96%   BMI 53.35 kg/m    Subjective:    Patient ID: Katherine [name], female    DOB: 07-16-1940, 77 y.o.   MRN: [id number]  HPI: Katherine [name] is a 77 y.o. female  Chief Complaint  Patient presents with  . Hospitalization Follow-up   Patient is not sure why she is here. She is currently admitted to SNF under the care of Dr. Lovie Macadamia. She has no complaints  Relevant past medical, surgical, family and social history reviewed and updated as indicated. Interim medical history since our last visit reviewed. Allergies and medications reviewed and updated.  Per HPI unless specifically indicated above     Objective:    BP 112/77   Pulse 78   Temp 98.6 F (37 C) (Tympanic)   Ht 5' (1.524 m)   SpO2 96%   BMI 53.35 kg/m   Wt Readings from Last 3 Encounters:  10/26/17 273 lb 2.4 oz (123.9 kg)  10/04/17 277 lb 5.4 oz (125.8 kg)  10/03/17 272 lb 1.6 oz (123.4 kg)     Results for orders placed or performed during the hospital encounter of 10/23/17  Blood Culture (routine x 2)  Result Value Ref Range   Specimen Description BLOOD RIGHT ASSIST CONTROL    Special Requests      BOTTLES DRAWN AEROBIC AND ANAEROBIC Blood Culture results may not be optimal due to an excessive volume of blood received in culture bottles   Culture      NO GROWTH 5 DAYS Performed at [facility], [address] [place], [place] [address]    Report Status 10/28/2017 FINAL   Blood Culture (routine x 2)  Result Value Ref Range   Specimen Description BLOOD BLOOD RIGHT WRIST    Special Requests      BOTTLES DRAWN AEROBIC AND ANAEROBIC Blood Culture results may not be optimal due to an excessive volume of blood received in culture bottles   Culture      NO GROWTH 5 DAYS Performed at [facility], [address] [place], [place] [address]    Report Status 10/29/2017 FINAL   MRSA PCR Screening  Result  Value Ref Range   MRSA by PCR NEGATIVE NEGATIVE  Lactic acid, plasma  Result Value Ref Range   Lactic Acid, Venous 2.4 (HH) 0.5 - 1.9 mmol/L  Lactic acid, plasma  Result Value Ref Range   Lactic Acid, Venous 1.0 0.5 - 1.9 mmol/L  Comprehensive metabolic panel  Result Value Ref Range   Sodium 138 135 - 145 mmol/L   Potassium 4.5 3.5 - 5.1 mmol/L   Chloride 102 98 - 111 mmol/L   CO2 28 22 - 32 mmol/L   Glucose, Bld 218 (H) 70 - 99 mg/dL   BUN 24 (H) 8 - 23 mg/dL   Creatinine, Ser 1.90 (H) 0.44 - 1.00 mg/dL   Calcium 8.6 (L) 8.9 - 10.3 mg/dL   Total Protein 6.3 (L) 6.5 - 8.1 g/dL   Albumin 2.9 (L) 3.5 - 5.0 g/dL   AST 17 15 - 41 U/L   ALT 15 0 - 44 U/L   Alkaline Phosphatase 60 38 - 126 U/L   Total Bilirubin 0.4 0.3 - 1.2 mg/dL   GFR calc non Af Amer 25 (L) >60 mL/min   GFR calc Af Amer 28 (L) >60 mL/min   Anion gap  8 5 - 15  Lipase, blood  Result Value Ref Range   Lipase 33 11 - 51 U/L  Brain natriuretic peptide  Result Value Ref Range   B Natriuretic Peptide 104.0 (H) 0.0 - 100.0 pg/mL  Troponin I  Result Value Ref Range   Troponin I <0.03 <0.03 ng/mL  CBC WITH DIFFERENTIAL  Result Value Ref Range   WBC 5.2 3.6 - 11.0 K/uL   RBC 3.62 (L) 3.80 - 5.20 MIL/uL   Hemoglobin 10.3 (L) 12.0 - 16.0 g/dL   HCT 32.0 (L) 35.0 - 47.0 %   MCV 88.3 80.0 - 100.0 fL   MCH 28.4 26.0 - 34.0 pg   MCHC 32.2 32.0 - 36.0 g/dL   RDW 20.8 (H) 11.5 - 14.5 %   Platelets 135 (L) 150 - 440 K/uL   Neutrophils Relative % 75 %   Neutro Abs 3.9 1.4 - 6.5 K/uL   Lymphocytes Relative 14 %   Lymphs Abs 0.7 (L) 1.0 - 3.6 K/uL   Monocytes Relative 9 %   Monocytes Absolute 0.4 0.2 - 0.9 K/uL   Eosinophils Relative 1 %   Eosinophils Absolute 0.0 0 - 0.7 K/uL   Basophils Relative 1 %   Basophils Absolute 0.0 0 - 0.1 K/uL  Blood gas, venous ([facility], [facility], [facility])  Result Value Ref Range   pH, Ven 7.33 7.250 - 7.430   pCO2, Ven 52 44.0 - 60.0 mmHg   pO2, Ven 41.0 32.0 - 45.0 mmHg   Bicarbonate 27.4 20.0 -  28.0 mmol/L   Acid-Base Excess 0.3 0.0 - 2.0 mmol/L   O2 Saturation 72.0 %   Patient temperature 37.0    Collection site VEIN    Sample type VENOUS   Urinalysis, Complete w Microscopic  Result Value Ref Range   Color, Urine YELLOW (A) YELLOW   APPearance CLOUDY (A) CLEAR   Specific Gravity, Urine 1.008 1.005 - 1.030   pH 5.0 5.0 - 8.0   Glucose, UA NEGATIVE NEGATIVE mg/dL   Hgb urine dipstick NEGATIVE NEGATIVE   Bilirubin Urine NEGATIVE NEGATIVE   Ketones, ur NEGATIVE NEGATIVE mg/dL   Protein, ur NEGATIVE NEGATIVE mg/dL   Nitrite NEGATIVE NEGATIVE   Leukocytes, UA LARGE (A) NEGATIVE   RBC / HPF 11-20 0 - 5 RBC/hpf   WBC, UA >50 (H) 0 - 5 WBC/hpf   Bacteria, UA FEW (A) NONE SEEN   Squamous Epithelial / LPF 0-5 0 - 5   Mucus PRESENT   Basic metabolic panel  Result Value Ref Range   Sodium 140 135 - 145 mmol/L   Potassium 4.4 3.5 - 5.1 mmol/L   Chloride 106 98 - 111 mmol/L   CO2 26 22 - 32 mmol/L   Glucose, Bld 168 (H) 70 - 99 mg/dL   BUN 24 (H) 8 - 23 mg/dL   Creatinine, Ser 1.63 (H) 0.44 - 1.00 mg/dL   Calcium 8.0 (L) 8.9 - 10.3 mg/dL   GFR calc non Af Amer 30 (L) >60 mL/min   GFR calc Af Amer 34 (L) >60 mL/min   Anion gap 8 5 - 15  CBC  Result Value Ref Range   WBC 4.6 3.6 - 11.0 K/uL   RBC 3.20 (L) 3.80 - 5.20 MIL/uL   Hemoglobin 9.1 (L) 12.0 - 16.0 g/dL   HCT 28.2 (L) 35.0 - 47.0 %   MCV 88.0 80.0 - 100.0 fL   MCH 28.4 26.0 - 34.0 pg   MCHC 32.3 32.0 - 36.0 g/dL  RDW 21.2 (H) 11.5 - 14.5 %   Platelets 111 (L) 150 - 440 K/uL  Glucose, capillary  Result Value Ref Range   Glucose-Capillary 137 (H) 70 - 99 mg/dL  Procalcitonin - Baseline  Result Value Ref Range   Procalcitonin <0.10 ng/mL  Glucose, capillary  Result Value Ref Range   Glucose-Capillary 149 (H) 70 - 99 mg/dL  Glucose, capillary  Result Value Ref Range   Glucose-Capillary 161 (H) 70 - 99 mg/dL  Glucose, capillary  Result Value Ref Range   Glucose-Capillary 134 (H) 70 - 99 mg/dL  Glucose,  capillary  Result Value Ref Range   Glucose-Capillary 123 (H) 70 - 99 mg/dL  Glucose, capillary  Result Value Ref Range   Glucose-Capillary 158 (H) 70 - 99 mg/dL  Glucose, capillary  Result Value Ref Range   Glucose-Capillary 125 (H) 70 - 99 mg/dL  Glucose, capillary  Result Value Ref Range   Glucose-Capillary 134 (H) 70 - 99 mg/dL  Glucose, capillary  Result Value Ref Range   Glucose-Capillary 129 (H) 70 - 99 mg/dL  Glucose, capillary  Result Value Ref Range   Glucose-Capillary 137 (H) 70 - 99 mg/dL  Glucose, capillary  Result Value Ref Range   Glucose-Capillary 102 (H) 70 - 99 mg/dL  Blood gas, arterial  Result Value Ref Range   FIO2 0.44    pH, Arterial 7.32 (L) 7.350 - 7.450   pCO2 arterial 55 (H) 32.0 - 48.0 mmHg   pO2, Arterial 60 (L) 83.0 - 108.0 mmHg   Bicarbonate 28.3 (H) 20.0 - 28.0 mmol/L   Acid-Base Excess 1.5 0.0 - 2.0 mmol/L   O2 Saturation 88.3 %   Patient temperature 37.0    Collection site RIGHT BRACHIAL    Sample type ARTERIAL DRAW    Allens test (pass/fail) PASS PASS  Glucose, capillary  Result Value Ref Range   Glucose-Capillary 83 70 - 99 mg/dL   Comment 1 Notify RN   CBC  Result Value Ref Range   WBC 3.1 (L) 3.6 - 11.0 K/uL   RBC 3.40 (L) 3.80 - 5.20 MIL/uL   Hemoglobin 9.7 (L) 12.0 - 16.0 g/dL   HCT 29.8 (L) 35.0 - 47.0 %   MCV 87.9 80.0 - 100.0 fL   MCH 28.6 26.0 - 34.0 pg   MCHC 32.5 32.0 - 36.0 g/dL   RDW 20.2 (H) 11.5 - 14.5 %   Platelets 131 (L) 150 - 440 K/uL  Glucose, capillary  Result Value Ref Range   Glucose-Capillary 138 (H) 70 - 99 mg/dL  Glucose, capillary  Result Value Ref Range   Glucose-Capillary 82 70 - 99 mg/dL  Glucose, capillary  Result Value Ref Range   Glucose-Capillary 101 (H) 70 - 99 mg/dL  Comprehensive metabolic panel  Result Value Ref Range   Sodium 140 135 - 145 mmol/L   Potassium 5.8 (H) 3.5 - 5.1 mmol/L   Chloride 105 98 - 111 mmol/L   CO2 27 22 - 32 mmol/L   Glucose, Bld 154 (H) 70 - 99 mg/dL   BUN  23 8 - 23 mg/dL   Creatinine, Ser 1.55 (H) 0.44 - 1.00 mg/dL   Calcium 9.0 8.9 - 10.3 mg/dL   Total Protein 6.4 (L) 6.5 - 8.1 g/dL   Albumin 2.7 (L) 3.5 - 5.0 g/dL   AST 13 (L) 15 - 41 U/L   ALT 14 0 - 44 U/L   Alkaline Phosphatase 56 38 - 126 U/L   Total Bilirubin 0.6 0.3 -  1.2 mg/dL   GFR calc non Af Amer 31 (L) >60 mL/min   GFR calc Af Amer 36 (L) >60 mL/min   Anion gap 8 5 - 15  Magnesium  Result Value Ref Range   Magnesium 1.6 (L) 1.7 - 2.4 mg/dL  Phosphorus  Result Value Ref Range   Phosphorus 3.3 2.5 - 4.6 mg/dL  Glucose, capillary  Result Value Ref Range   Glucose-Capillary 153 (H) 70 - 99 mg/dL  Basic metabolic panel  Result Value Ref Range   Sodium 137 135 - 145 mmol/L   Potassium 5.4 (H) 3.5 - 5.1 mmol/L   Chloride 103 98 - 111 mmol/L   CO2 24 22 - 32 mmol/L   Glucose, Bld 243 (H) 70 - 99 mg/dL   BUN 26 (H) 8 - 23 mg/dL   Creatinine, Ser 1.77 (H) 0.44 - 1.00 mg/dL   Calcium 9.0 8.9 - 10.3 mg/dL   GFR calc non Af Amer 27 (L) >60 mL/min   GFR calc Af Amer 31 (L) >60 mL/min   Anion gap 10 5 - 15  Glucose, capillary  Result Value Ref Range   Glucose-Capillary 199 (H) 70 - 99 mg/dL  Basic metabolic panel  Result Value Ref Range   Sodium 138 135 - 145 mmol/L   Potassium 5.5 (H) 3.5 - 5.1 mmol/L   Chloride 103 98 - 111 mmol/L   CO2 28 22 - 32 mmol/L   Glucose, Bld 196 (H) 70 - 99 mg/dL   BUN 27 (H) 8 - 23 mg/dL   Creatinine, Ser 1.68 (H) 0.44 - 1.00 mg/dL   Calcium 8.8 (L) 8.9 - 10.3 mg/dL   GFR calc non Af Amer 28 (L) >60 mL/min   GFR calc Af Amer 33 (L) >60 mL/min   Anion gap 7 5 - 15  Glucose, capillary  Result Value Ref Range   Glucose-Capillary 197 (H) 70 - 99 mg/dL  Glucose, capillary  Result Value Ref Range   Glucose-Capillary 170 (H) 70 - 99 mg/dL  Glucose, capillary  Result Value Ref Range   Glucose-Capillary 169 (H) 70 - 99 mg/dL  Glucose, capillary  Result Value Ref Range   Glucose-Capillary 185 (H) 70 - 99 mg/dL  Basic metabolic panel    Result Value Ref Range   Sodium 138 135 - 145 mmol/L   Potassium 4.5 3.5 - 5.1 mmol/L   Chloride 98 98 - 111 mmol/L   CO2 34 (H) 22 - 32 mmol/L   Glucose, Bld 209 (H) 70 - 99 mg/dL   BUN 36 (H) 8 - 23 mg/dL   Creatinine, Ser 1.77 (H) 0.44 - 1.00 mg/dL   Calcium 8.7 (L) 8.9 - 10.3 mg/dL   GFR calc non Af Amer 27 (L) >60 mL/min   GFR calc Af Amer 31 (L) >60 mL/min   Anion gap 6 5 - 15  Glucose, capillary  Result Value Ref Range   Glucose-Capillary 261 (H) 70 - 99 mg/dL  Glucose, capillary  Result Value Ref Range   Glucose-Capillary 171 (H) 70 - 99 mg/dL  Glucose, capillary  Result Value Ref Range   Glucose-Capillary 275 (H) 70 - 99 mg/dL  Glucose, capillary  Result Value Ref Range   Glucose-Capillary 237 (H) 70 - 99 mg/dL      Assessment & Plan:   Patient is still admitted to the SNF and under their care. Will follow up with her when she is discharged. Not seen today.  Follow up plan: Return When discharged  from the SNF.

## 2017-11-07 DIAGNOSIS — E785 Hyperlipidemia, unspecified: Secondary | ICD-10-CM | POA: Diagnosis not present

## 2017-11-07 DIAGNOSIS — I10 Essential (primary) hypertension: Secondary | ICD-10-CM | POA: Diagnosis not present

## 2017-11-07 DIAGNOSIS — G459 Transient cerebral ischemic attack, unspecified: Secondary | ICD-10-CM | POA: Diagnosis not present

## 2017-11-07 DIAGNOSIS — K219 Gastro-esophageal reflux disease without esophagitis: Secondary | ICD-10-CM | POA: Diagnosis not present

## 2017-11-07 DIAGNOSIS — E119 Type 2 diabetes mellitus without complications: Secondary | ICD-10-CM | POA: Diagnosis not present

## 2017-11-07 DIAGNOSIS — J449 Chronic obstructive pulmonary disease, unspecified: Secondary | ICD-10-CM | POA: Diagnosis not present

## 2017-11-07 NOTE — Patient Outreach (Signed)
[facility]) Care Management  11/07/2017  Katherine [name] 12/09/1940 [id number]   Late Entry-Post Acute Coordination visit  This social worker met with patient's discharge planner on 11/06/17 Donnalee Curry to discuss patient's progress in rehab. Per Janett Billow, patient will discharge home with [facility] on 11/08/17. Per Jesscia, no DME needs. Husband was provided with care gving training today.  This Education officer, museum met with patient briefly while in PT. Patient states that she is ready to return home, however understands the importance of [place]. Patient reports plan to actively participate in [facility] and will inform her husband of this as well to increase his engagement.   Plan: This social worker will inform Trish Fountain, [facility] of  Patient's discharge. This social worker will follow up with patient to assess for community resource needs post discharge from rehab.   Sheralyn Boatman [facility] Care Management [phone]

## 2017-11-09 ENCOUNTER — Emergency Department
Admission: EM | Admit: 2017-11-09 | Discharge: 2017-11-09 | Disposition: A | Discharge status: Home / Self Care | Payer: Medicare HMO | Source: Home / Self Care | Attending: Emergency Medicine | Admitting: Emergency Medicine

## 2017-11-09 ENCOUNTER — Other Ambulatory Visit: Payer: Self-pay

## 2017-11-09 ENCOUNTER — Emergency Department: Payer: Medicare HMO

## 2017-11-09 ENCOUNTER — Encounter: Payer: Self-pay | Admitting: Emergency Medicine

## 2017-11-09 DIAGNOSIS — Z79899 Other long term (current) drug therapy: Secondary | ICD-10-CM | POA: Insufficient documentation

## 2017-11-09 DIAGNOSIS — J441 Chronic obstructive pulmonary disease with (acute) exacerbation: Secondary | ICD-10-CM | POA: Diagnosis not present

## 2017-11-09 DIAGNOSIS — I2510 Atherosclerotic heart disease of native coronary artery without angina pectoris: Secondary | ICD-10-CM | POA: Diagnosis not present

## 2017-11-09 DIAGNOSIS — R45851 Suicidal ideations: Secondary | ICD-10-CM | POA: Diagnosis not present

## 2017-11-09 DIAGNOSIS — S79911A Unspecified injury of right hip, initial encounter: Secondary | ICD-10-CM | POA: Diagnosis not present

## 2017-11-09 DIAGNOSIS — F329 Major depressive disorder, single episode, unspecified: Secondary | ICD-10-CM | POA: Diagnosis present

## 2017-11-09 DIAGNOSIS — R471 Dysarthria and anarthria: Secondary | ICD-10-CM | POA: Diagnosis not present

## 2017-11-09 DIAGNOSIS — R4182 Altered mental status, unspecified: Secondary | ICD-10-CM | POA: Diagnosis not present

## 2017-11-09 DIAGNOSIS — N183 Chronic kidney disease, stage 3 (moderate): Secondary | ICD-10-CM

## 2017-11-09 DIAGNOSIS — Y929 Unspecified place or not applicable: Secondary | ICD-10-CM | POA: Insufficient documentation

## 2017-11-09 DIAGNOSIS — Z832 Family history of diseases of the blood and blood-forming organs and certain disorders involving the immune mechanism: Secondary | ICD-10-CM | POA: Diagnosis not present

## 2017-11-09 DIAGNOSIS — T783XXA Angioneurotic edema, initial encounter: Secondary | ICD-10-CM | POA: Diagnosis not present

## 2017-11-09 DIAGNOSIS — I129 Hypertensive chronic kidney disease with stage 1 through stage 4 chronic kidney disease, or unspecified chronic kidney disease: Secondary | ICD-10-CM | POA: Insufficient documentation

## 2017-11-09 DIAGNOSIS — Z96651 Presence of right artificial knee joint: Secondary | ICD-10-CM | POA: Diagnosis present

## 2017-11-09 DIAGNOSIS — Z7902 Long term (current) use of antithrombotics/antiplatelets: Secondary | ICD-10-CM | POA: Insufficient documentation

## 2017-11-09 DIAGNOSIS — E559 Vitamin D deficiency, unspecified: Secondary | ICD-10-CM | POA: Diagnosis present

## 2017-11-09 DIAGNOSIS — Y939 Activity, unspecified: Secondary | ICD-10-CM | POA: Insufficient documentation

## 2017-11-09 DIAGNOSIS — J9601 Acute respiratory failure with hypoxia: Secondary | ICD-10-CM | POA: Diagnosis not present

## 2017-11-09 DIAGNOSIS — R0602 Shortness of breath: Secondary | ICD-10-CM | POA: Diagnosis not present

## 2017-11-09 DIAGNOSIS — K5909 Other constipation: Secondary | ICD-10-CM | POA: Diagnosis present

## 2017-11-09 DIAGNOSIS — J449 Chronic obstructive pulmonary disease, unspecified: Secondary | ICD-10-CM | POA: Insufficient documentation

## 2017-11-09 DIAGNOSIS — M25562 Pain in left knee: Secondary | ICD-10-CM | POA: Diagnosis not present

## 2017-11-09 DIAGNOSIS — J9621 Acute and chronic respiratory failure with hypoxia: Secondary | ICD-10-CM | POA: Diagnosis not present

## 2017-11-09 DIAGNOSIS — Z87891 Personal history of nicotine dependence: Secondary | ICD-10-CM

## 2017-11-09 DIAGNOSIS — K219 Gastro-esophageal reflux disease without esophagitis: Secondary | ICD-10-CM | POA: Diagnosis present

## 2017-11-09 DIAGNOSIS — Y998 Other external cause status: Secondary | ICD-10-CM

## 2017-11-09 DIAGNOSIS — D631 Anemia in chronic kidney disease: Secondary | ICD-10-CM | POA: Diagnosis present

## 2017-11-09 DIAGNOSIS — R569 Unspecified convulsions: Secondary | ICD-10-CM | POA: Diagnosis present

## 2017-11-09 DIAGNOSIS — J9811 Atelectasis: Secondary | ICD-10-CM | POA: Diagnosis not present

## 2017-11-09 DIAGNOSIS — M1990 Unspecified osteoarthritis, unspecified site: Secondary | ICD-10-CM | POA: Diagnosis present

## 2017-11-09 DIAGNOSIS — I130 Hypertensive heart and chronic kidney disease with heart failure and stage 1 through stage 4 chronic kidney disease, or unspecified chronic kidney disease: Secondary | ICD-10-CM | POA: Diagnosis not present

## 2017-11-09 DIAGNOSIS — F419 Anxiety disorder, unspecified: Secondary | ICD-10-CM | POA: Diagnosis present

## 2017-11-09 DIAGNOSIS — W19XXXA Unspecified fall, initial encounter: Secondary | ICD-10-CM | POA: Insufficient documentation

## 2017-11-09 DIAGNOSIS — G92 Toxic encephalopathy: Secondary | ICD-10-CM | POA: Diagnosis not present

## 2017-11-09 DIAGNOSIS — S7001XA Contusion of right hip, initial encounter: Secondary | ICD-10-CM | POA: Insufficient documentation

## 2017-11-09 DIAGNOSIS — E785 Hyperlipidemia, unspecified: Secondary | ICD-10-CM | POA: Diagnosis present

## 2017-11-09 DIAGNOSIS — Z9049 Acquired absence of other specified parts of digestive tract: Secondary | ICD-10-CM | POA: Diagnosis not present

## 2017-11-09 DIAGNOSIS — I50810 Right heart failure, unspecified: Secondary | ICD-10-CM | POA: Diagnosis present

## 2017-11-09 DIAGNOSIS — M25551 Pain in right hip: Secondary | ICD-10-CM | POA: Diagnosis not present

## 2017-11-09 DIAGNOSIS — E1122 Type 2 diabetes mellitus with diabetic chronic kidney disease: Secondary | ICD-10-CM | POA: Diagnosis not present

## 2017-11-09 NOTE — ED Notes (Signed)
First Nurse Note: Patient states she fell yesterday.  On home O2 - switched to our tank at 2L via [place].

## 2017-11-09 NOTE — Patient Outreach (Signed)
[facility]) Care Management  11/09/2017  Marta Bouie [birth year] [id number]  Transition of Care (Attempt): Unsuccessful transition of care attempt to the above patient after discharge from [facility] on 11/08/17. Patient is known to [facility] RN CM. Upon review of EMR, patient is currently at [facility] ED after sustaining a fall yesterday upon returning home. Per documentation, husband called EMS to help get patient up, no medical treatment provided. Patient awoke this am with R hip pain and swelling.  RN CM unable to leave message at only given number. "The mailbox for Bennie Hind is currently full and not accepting new messages. Please try again later".   Plan: Will continue to follow patient via EMR. Will attempt contact on Monday if discharged from ED.  Micah Barnier E. Rollene Rotunda RN, BSN [facility] Care Management Coordinator [phone]

## 2017-11-09 NOTE — ED Notes (Signed)
Patient transported to X-ray 

## 2017-11-09 NOTE — ED Triage Notes (Signed)
Pt reports her right leg gave out on her yesterday and landed on her right side. Pt reports pain to her right hip and her entire right side is painful. Pt is oxygen dependent on 3L at home. Family reports pt is at baseline the than the pain to her right hip and side.

## 2017-11-09 NOTE — ED Notes (Addendum)
Fell yesterday afternoon, "Right leg gave way".  Head landed on husbands foot, ems called to help get pt up. Woke up and felt swollen.  Hx of right knee surgery. Unknown if on blood thinners. R hip pain, denies any other pain sights.

## 2017-11-09 NOTE — ED Provider Notes (Signed)
[facility] Emergency Department Provider Note   ____________________________________________    I have reviewed the triage vital signs and the nursing notes.   HISTORY  Chief Complaint Fall     HPI Katherine [name] is a 77 y.o. female who presents after a fall which occurred yesterday.  She complains of pain in long her right hip primarily.  Denies upper extremity injury.  No head injury.  She has a history is noted below, does wear oxygen for COPD and reports her breathing is at baseline.  No chest wall pain.  Has not taken anything for her pain.  No dizziness, this was a mechanical fall, she states that "her knee will give out on her ".  Past Medical History:  Diagnosis Date  . Angioedema   . Anxiety   . Anxiety and depression   . Chronic constipation   . Chronic kidney disease    stage 3  . COPD (chronic obstructive pulmonary disease) ([place])   . Gallstones   . GERD (gastroesophageal reflux disease)   . Hypertension   . Left ventricular hypertrophy   . Osteoarthritis   . Prurigo nodularis   . Tobacco abuse   . Vitamin D deficiency disease     Patient Active Problem List   Diagnosis Date Noted  . Pressure injury of skin 10/29/2017  . Sepsis ([place]) 10/24/2017  . Acute kidney injury superimposed on CKD ([place]) 10/24/2017  . Severe sepsis ([place]) 10/24/2017  . Possible Seizures ([place]) 10/08/2017  . Hyperlipemia 10/08/2017  . 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 Stroke-like episode ([place]) s/p IV tpa 10/04/2017  . Palliative care by specialist   . HCAP (healthcare-associated pneumonia) 09/11/2017  . Elevated rheumatoid factor 09/05/2017  . COPD exacerbation ([place]) 08/27/2017  . Frequent hospital admissions 08/22/2017  . Aphasia 06/28/2017  . Acute on chronic respiratory failure with hypoxia and hypercapnia ([place]) 06/12/2017  . Hand pain 03/21/2017  . Dry skin 03/21/2017  . Pain in finger of left hand 09/12/2016  . Chronic  fatigue 06/12/2016  . Left knee pain 06/12/2016  . Goals of care, counseling/discussion 03/13/2016  . Primary localized osteoarthritis of right knee 02/08/2016  . OSA and COPD overlap syndrome ([place]) 09/16/2015  . Rotator cuff syndrome 09/07/2015  . 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 Sleep disturbance 04/14/2015  . Coronary artery disease 03/14/2015  . 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 Anxiety   . Tobacco abuse   . Prurigo nodularis   . GERD (gastroesophageal reflux disease)   . COPD (chronic obstructive pulmonary disease) ([place])   . Osteoarthritis   . Vitamin D deficiency disease   . Chronic constipation   . CKD (chronic kidney disease), stage III ([place])   . LVH (left ventricular hypertrophy) 05/20/2013  . Essential hypertension 05/20/2013  . Morbid obesity ([place]) 05/20/2013    Past Surgical History:  Procedure Laterality Date  . CHOLECYSTECTOMY    . 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   Procedure: TOTAL KNEE ARTHROPLASTY;  Surgeon: Hessie Knows, MD;  Location: [facility] ORS;  Service: Orthopedics;  Laterality: Right;    Prior to Admission medications   Medication Sig Start Date End Date Taking? Authorizing Provider  acetaminophen (TYLENOL) 650 MG CR tablet Take 650-1,300 mg by mouth every 8 (eight) hours as needed for pain.    [provider]  albuterol (ACCUNEB) 0.63 MG/3ML nebulizer solution Take 3 mLs  by nebulization 3 (three) times daily as needed for wheezing.    [provider]  albuterol (PROVENTIL HFA;VENTOLIN HFA) 108 (90 Base) MCG/ACT inhaler Inhale 2 puffs into the lungs every 4 (four) hours as needed for wheezing or shortness of breath.    [provider]  ALPRAZolam Duanne Moron) 0.5 MG tablet Take 0.5 mg by mouth 2 (two) times daily as needed for anxiety.    [provider]  atorvastatin (LIPITOR) 80 MG tablet Take 80 mg by mouth daily.    [provider]  cetirizine (ZYRTEC) 10 MG tablet Take 5 mg by mouth at bedtime.    [provider]  cetirizine (ZYRTEC) 10 MG tablet TAKE 1/2 TABLET(5 MG) BY MOUTH AT BEDTIME 10/25/17   Johnson, Megan P, DO  cholecalciferol (VITAMIN D) 1000 units tablet Take 1,000 Units by mouth daily.    [provider]  clopidogrel (PLAVIX) 75 MG tablet Take 75 mg by mouth daily.    [provider]  diltiazem (CARDIZEM SR) 120 MG 12 hr capsule Take 120 mg by mouth 2 (two) times daily.    [provider]  docusate sodium (COLACE) 100 MG capsule Take 100 mg by mouth daily.    [provider]  FLUoxetine (PROZAC) 10 MG capsule Take 10 mg by mouth daily.    [provider]  Fluticasone-Umeclidin-Vilant 100-62.5-25 MCG/INH AEPB Inhale 1 puff into the lungs daily.    [provider]  gabapentin (NEURONTIN) 300 MG capsule Take 300 mg by mouth at bedtime.    [provider]  hydrALAZINE (APRESOLINE) 25 MG tablet Take 25 mg by mouth 3 (three) times daily.    [provider]  ipratropium (ATROVENT) 0.03 % nasal spray Place 2 sprays into both nostrils every 12 (twelve) hours.    [provider]  ipratropium-albuterol (DUONEB) 0.5-2.5 (3) MG/3ML SOLN Take 3 mLs by nebulization every 6 (six) hours as needed (shortness of breath).    [provider]  levETIRAcetam (KEPPRA XR) 500 MG 24 hr tablet Take 2 tablets (1,000 mg total) by mouth daily. 10/08/17   Rinehuls, David L, PA-C  linagliptin (TRADJENTA) 5 MG TABS tablet Take 1 tablet (5 mg total) by mouth daily. 10/10/17   Donzetta Starch, NP  meclizine (ANTIVERT) 25 MG tablet Take 25 mg by mouth 2 (two) times daily as needed for dizziness.    [provider]  methocarbamol (ROBAXIN) 500 MG tablet Take 500 mg by mouth every 8 (eight) hours as needed for muscle spasms.    [provider]  pantoprazole (PROTONIX) 40 MG tablet Take 40 mg by mouth daily.    [provider]    tiotropium (SPIRIVA) 18 MCG inhalation capsule Place 18 mcg into inhaler and inhale daily.    [provider]     Allergies Bee venom and Enalapril maleate  Family History  Problem Relation Age of Onset  . Alcohol abuse Mother   . Sickle cell anemia Daughter   . Hypertension Son   . Cancer Neg Hx   . COPD Neg Hx   . Diabetes Neg Hx   . Heart disease Neg Hx   . Stroke Neg Hx     Social History Social History   Tobacco Use  . Smoking status: Former Smoker    Packs/day: 0.50    Years: 60.00    Pack years: 30.00    Types: Cigarettes  . Smokeless tobacco: Never Used  Substance Use Topics  . Alcohol use:  No    Alcohol/week: 0.0 standard drinks    Comment: rare  . Drug use: No    Review of Systems  Constitutional: No fever/chills Eyes: No visual changes.  ENT: No neck pain Cardiovascular: Denies chest pain. Respiratory: Chronic shortness of breath Gastrointestinal: No abdominal pain.   Genitourinary: Negative for dysuria. Musculoskeletal: Right hip pain Skin: Negative for rash. Neurological: Negative for headaches    ____________________________________________   PHYSICAL EXAM:  VITAL SIGNS: ED Triage Vitals  Enc Vitals Group     BP 11/09/17 0925 (!) 94/43     Pulse Rate 11/09/17 0925 70     Resp 11/09/17 0932 (!) 23     Temp 11/09/17 0925 98.4 F (36.9 C)     Temp Source 11/09/17 0925 Oral     SpO2 11/09/17 0925 (!) 88 %     Weight 11/09/17 0926 125.6 kg (277 lb)     Height 11/09/17 0926 1.702 m (5\' 7" )     Head Circumference --      [place] --      Pain Score 11/09/17 0944 8     Pain Loc --      Pain Edu? --      Excl. in [place]? --     Constitutional: Alert and oriented.  Eyes: Conjunctivae are normal.  Head: Atraumatic  Neck:  Painless ROM no vertebral tenderness to palpation Cardiovascular: Normal rate, regular rhythm. Grossly normal heart sounds.  Good peripheral circulation. Respiratory: Normal respiratory effort.  No  retractions.  Scattered wheezes Gastrointestinal: Soft and nontender. No distention.  No CVA tenderness.  Musculoskeletal: Mild tenderness to palpation along the right lateral hip, overlying the greater trochanter, no pain with axial load warm and well perfused Neurologic:  Normal speech and language. No gross focal neurologic deficits are appreciated.  Skin:  Skin is warm, dry and intact. No rash noted. Psychiatric: Mood and affect are normal. Speech and behavior are normal.  ____________________________________________   LABS (all labs ordered are listed, but only abnormal results are displayed)  Labs Reviewed - No data to display ____________________________________________  EKG  None ____________________________________________  RADIOLOGY  X-ray pelvis demonstrates no acute fractures ____________________________________________   PROCEDURES  Procedure(s) performed: No  Procedures   Critical Care performed: No ____________________________________________   INITIAL IMPRESSION / ASSESSMENT AND PLAN / ED COURSE  Pertinent labs & imaging results that were available during my care of the patient were reviewed by me and considered in my medical decision making (see chart for details).  Patient overall appears to be at her baseline, 88% is normal for her per patient and her family, she reports her breathing is at baseline.  She is here only for her hip pain.  X-ray is reassuring in this regard, consistent with contusion, recommend ice, Tylenol    ____________________________________________   FINAL CLINICAL IMPRESSION(S) / ED DIAGNOSES  Final diagnoses:  Fall, initial encounter  Contusion of right hip, initial encounter        Note:  This document was prepared using Dragon voice recognition software and may include unintentional dictation errors.    Lavonia Drafts, MD 11/09/17 [phone]

## 2017-11-10 ENCOUNTER — Other Ambulatory Visit: Payer: Self-pay

## 2017-11-10 ENCOUNTER — Inpatient Hospital Stay: Payer: Medicare HMO

## 2017-11-10 ENCOUNTER — Emergency Department: Payer: Medicare HMO

## 2017-11-10 ENCOUNTER — Inpatient Hospital Stay
Admission: EM | Admit: 2017-11-10 | Discharge: 2017-11-13 | DRG: 915 | Disposition: A | Discharge status: Home Health | Payer: Medicare HMO | Attending: Family Medicine | Admitting: Family Medicine

## 2017-11-10 DIAGNOSIS — E785 Hyperlipidemia, unspecified: Secondary | ICD-10-CM | POA: Diagnosis present

## 2017-11-10 DIAGNOSIS — G92 Toxic encephalopathy: Secondary | ICD-10-CM | POA: Diagnosis present

## 2017-11-10 DIAGNOSIS — Z888 Allergy status to other drugs, medicaments and biological substances status: Secondary | ICD-10-CM

## 2017-11-10 DIAGNOSIS — J9621 Acute and chronic respiratory failure with hypoxia: Secondary | ICD-10-CM | POA: Diagnosis present

## 2017-11-10 DIAGNOSIS — T783XXA Angioneurotic edema, initial encounter: Secondary | ICD-10-CM | POA: Diagnosis present

## 2017-11-10 DIAGNOSIS — Z91030 Bee allergy status: Secondary | ICD-10-CM

## 2017-11-10 DIAGNOSIS — F419 Anxiety disorder, unspecified: Secondary | ICD-10-CM | POA: Diagnosis present

## 2017-11-10 DIAGNOSIS — D631 Anemia in chronic kidney disease: Secondary | ICD-10-CM | POA: Diagnosis present

## 2017-11-10 DIAGNOSIS — R569 Unspecified convulsions: Secondary | ICD-10-CM | POA: Diagnosis present

## 2017-11-10 DIAGNOSIS — W19XXXA Unspecified fall, initial encounter: Secondary | ICD-10-CM | POA: Diagnosis present

## 2017-11-10 DIAGNOSIS — J441 Chronic obstructive pulmonary disease with (acute) exacerbation: Secondary | ICD-10-CM | POA: Diagnosis present

## 2017-11-10 DIAGNOSIS — M1990 Unspecified osteoarthritis, unspecified site: Secondary | ICD-10-CM | POA: Diagnosis present

## 2017-11-10 DIAGNOSIS — E1122 Type 2 diabetes mellitus with diabetic chronic kidney disease: Secondary | ICD-10-CM | POA: Diagnosis present

## 2017-11-10 DIAGNOSIS — N183 Chronic kidney disease, stage 3 (moderate): Secondary | ICD-10-CM | POA: Diagnosis present

## 2017-11-10 DIAGNOSIS — R4182 Altered mental status, unspecified: Secondary | ICD-10-CM

## 2017-11-10 DIAGNOSIS — I130 Hypertensive heart and chronic kidney disease with heart failure and stage 1 through stage 4 chronic kidney disease, or unspecified chronic kidney disease: Secondary | ICD-10-CM | POA: Diagnosis present

## 2017-11-10 DIAGNOSIS — S7001XA Contusion of right hip, initial encounter: Secondary | ICD-10-CM | POA: Diagnosis present

## 2017-11-10 DIAGNOSIS — R45851 Suicidal ideations: Secondary | ICD-10-CM | POA: Diagnosis present

## 2017-11-10 DIAGNOSIS — Z9981 Dependence on supplemental oxygen: Secondary | ICD-10-CM

## 2017-11-10 DIAGNOSIS — Z832 Family history of diseases of the blood and blood-forming organs and certain disorders involving the immune mechanism: Secondary | ICD-10-CM | POA: Diagnosis not present

## 2017-11-10 DIAGNOSIS — E559 Vitamin D deficiency, unspecified: Secondary | ICD-10-CM | POA: Diagnosis present

## 2017-11-10 DIAGNOSIS — K219 Gastro-esophageal reflux disease without esophagitis: Secondary | ICD-10-CM | POA: Diagnosis present

## 2017-11-10 DIAGNOSIS — K5909 Other constipation: Secondary | ICD-10-CM | POA: Diagnosis present

## 2017-11-10 DIAGNOSIS — Z96651 Presence of right artificial knee joint: Secondary | ICD-10-CM | POA: Diagnosis present

## 2017-11-10 DIAGNOSIS — Z811 Family history of alcohol abuse and dependence: Secondary | ICD-10-CM

## 2017-11-10 DIAGNOSIS — I2510 Atherosclerotic heart disease of native coronary artery without angina pectoris: Secondary | ICD-10-CM | POA: Diagnosis present

## 2017-11-10 DIAGNOSIS — Z7902 Long term (current) use of antithrombotics/antiplatelets: Secondary | ICD-10-CM

## 2017-11-10 DIAGNOSIS — Z87891 Personal history of nicotine dependence: Secondary | ICD-10-CM

## 2017-11-10 DIAGNOSIS — Z9049 Acquired absence of other specified parts of digestive tract: Secondary | ICD-10-CM | POA: Diagnosis not present

## 2017-11-10 DIAGNOSIS — R0602 Shortness of breath: Secondary | ICD-10-CM | POA: Diagnosis present

## 2017-11-10 DIAGNOSIS — Z8249 Family history of ischemic heart disease and other diseases of the circulatory system: Secondary | ICD-10-CM

## 2017-11-10 DIAGNOSIS — F329 Major depressive disorder, single episode, unspecified: Secondary | ICD-10-CM | POA: Diagnosis present

## 2017-11-10 DIAGNOSIS — I50810 Right heart failure, unspecified: Secondary | ICD-10-CM | POA: Diagnosis present

## 2017-11-10 DIAGNOSIS — Z79899 Other long term (current) drug therapy: Secondary | ICD-10-CM

## 2017-11-10 LAB — CBC WITH DIFFERENTIAL/PLATELET
Basophils Absolute: 0 10*3/uL (ref 0–0.1)
Basophils Relative: 0 %
Eosinophils Absolute: 0 10*3/uL (ref 0–0.7)
Eosinophils Relative: 0 %
HCT: 28.2 % — ABNORMAL LOW (ref 35.0–47.0)
Hemoglobin: 9.6 g/dL — ABNORMAL LOW (ref 12.0–16.0)
Lymphocytes Relative: 14 %
Lymphs Abs: 0.9 10*3/uL — ABNORMAL LOW (ref 1.0–3.6)
MCH: 29.4 pg (ref 26.0–34.0)
MCHC: 34 g/dL (ref 32.0–36.0)
MCV: 86.3 fL (ref 80.0–100.0)
Monocytes Absolute: 0.6 10*3/uL (ref 0.2–0.9)
Monocytes Relative: 9 %
Neutro Abs: 5.1 10*3/uL (ref 1.4–6.5)
Neutrophils Relative %: 77 %
Platelets: 195 10*3/uL (ref 150–440)
RBC: 3.27 MIL/uL — ABNORMAL LOW (ref 3.80–5.20)
RDW: 20.3 % — ABNORMAL HIGH (ref 11.5–14.5)
WBC: 6.7 10*3/uL (ref 3.6–11.0)

## 2017-11-10 LAB — CREATININE, SERUM
Creatinine, Ser: 1.6 mg/dL — ABNORMAL HIGH (ref 0.44–1.00)
GFR calc Af Amer: 35 mL/min — ABNORMAL LOW (ref >60)
GFR, EST NON AFRICAN AMERICAN: 30 mL/min — AB (ref >60)

## 2017-11-10 LAB — CBC
HEMATOCRIT: 30.6 % — AB (ref 35.0–47.0)
HEMOGLOBIN: 10.1 g/dL — AB (ref 12.0–16.0)
MCH: 28.7 pg (ref 26.0–34.0)
MCHC: 32.9 g/dL (ref 32.0–36.0)
MCV: 87.4 fL (ref 80.0–100.0)
Platelets: 203 10*3/uL (ref 150–440)
RBC: 3.51 MIL/uL — ABNORMAL LOW (ref 3.80–5.20)
RDW: 20.3 % — ABNORMAL HIGH (ref 11.5–14.5)
WBC: 6.4 10*3/uL (ref 3.6–11.0)

## 2017-11-10 LAB — URINALYSIS, COMPLETE (UACMP) WITH MICROSCOPIC
BILIRUBIN URINE: NEGATIVE
Bacteria, UA: NONE SEEN
GLUCOSE, UA: NEGATIVE mg/dL
HGB URINE DIPSTICK: NEGATIVE
Ketones, ur: NEGATIVE mg/dL
Nitrite: NEGATIVE
PROTEIN: NEGATIVE mg/dL
Specific Gravity, Urine: 1.014 (ref 1.005–1.030)
pH: 5 (ref 5.0–8.0)

## 2017-11-10 LAB — BLOOD GAS, VENOUS
ACID-BASE EXCESS: 4.7 mmol/L — AB (ref 0.0–2.0)
Bicarbonate: 32.2 mmol/L — ABNORMAL HIGH (ref 20.0–28.0)
FIO2: 0.21
O2 SAT: 62.7 %
PATIENT TEMPERATURE: 37
PO2 VEN: 36 mmHg (ref 32.0–45.0)
pCO2, Ven: 64 mmHg — ABNORMAL HIGH (ref 44.0–60.0)
pH, Ven: 7.31 (ref 7.250–7.430)

## 2017-11-10 LAB — COMPREHENSIVE METABOLIC PANEL
ALK PHOS: 60 U/L (ref 38–126)
ALT: 19 U/L (ref 0–44)
AST: 20 U/L (ref 15–41)
Albumin: 2.9 g/dL — ABNORMAL LOW (ref 3.5–5.0)
Anion gap: 9 (ref 5–15)
BILIRUBIN TOTAL: 0.6 mg/dL (ref 0.3–1.2)
BUN: 19 mg/dL (ref 8–23)
CO2: 30 mmol/L (ref 22–32)
CREATININE: 1.73 mg/dL — AB (ref 0.44–1.00)
Calcium: 8.7 mg/dL — ABNORMAL LOW (ref 8.9–10.3)
Chloride: 107 mmol/L (ref 98–111)
GFR, EST AFRICAN AMERICAN: 32 mL/min — AB (ref >60)
GFR, EST NON AFRICAN AMERICAN: 27 mL/min — AB (ref >60)
Glucose, Bld: 137 mg/dL — ABNORMAL HIGH (ref 70–99)
Potassium: 4.5 mmol/L (ref 3.5–5.1)
Sodium: 146 mmol/L — ABNORMAL HIGH (ref 135–145)
TOTAL PROTEIN: 6.3 g/dL — AB (ref 6.5–8.1)

## 2017-11-10 LAB — GLUCOSE, CAPILLARY
GLUCOSE-CAPILLARY: 132 mg/dL — AB (ref 70–99)
Glucose-Capillary: 121 mg/dL — ABNORMAL HIGH (ref 70–99)

## 2017-11-10 LAB — IRON AND TIBC
IRON: 28 ug/dL (ref 28–170)
SATURATION RATIOS: 8 % — AB (ref 10.4–31.8)
TIBC: 352 ug/dL (ref 250–450)
UIBC: 324 ug/dL

## 2017-11-10 LAB — FOLATE: Folate: 37 ng/mL (ref >5.9)

## 2017-11-10 LAB — TROPONIN I: Troponin I: <0.03 ng/mL

## 2017-11-10 LAB — FERRITIN: FERRITIN: 25 ng/mL (ref 11–307)

## 2017-11-10 LAB — LACTIC ACID, PLASMA: Lactic Acid, Venous: 1.2 mmol/L (ref 0.5–1.9)

## 2017-11-10 LAB — PROCALCITONIN: Procalcitonin: <0.1 ng/mL

## 2017-11-10 MED ORDER — LORATADINE 10 MG PO TABS
10.0000 mg | ORAL_TABLET | Freq: Every day | ORAL | Status: DC
  Administered 2017-11-10 – 2017-11-13 (×4): 10 mg via ORAL
  Filled 2017-11-10 (×4): qty 1

## 2017-11-10 MED ORDER — METHYLPREDNISOLONE SODIUM SUCC 125 MG IJ SOLR
60.0000 mg | Freq: Two times a day (BID) | INTRAMUSCULAR | Status: DC
  Administered 2017-11-11: 60 mg via INTRAVENOUS
  Filled 2017-11-10: qty 2

## 2017-11-10 MED ORDER — MECLIZINE HCL 25 MG PO TABS
25.0000 mg | ORAL_TABLET | Freq: Two times a day (BID) | ORAL | Status: DC | PRN
  Filled 2017-11-10: qty 1

## 2017-11-10 MED ORDER — DOCUSATE SODIUM 100 MG PO CAPS
100.0000 mg | ORAL_CAPSULE | Freq: Every day | ORAL | Status: DC
  Administered 2017-11-11 – 2017-11-13 (×3): 100 mg via ORAL
  Filled 2017-11-10 (×3): qty 1

## 2017-11-10 MED ORDER — ALPRAZOLAM 0.5 MG PO TABS: 0.5000 mg | ORAL_TABLET | Freq: Two times a day (BID) | ORAL | Status: DC | PRN

## 2017-11-10 MED ORDER — DILTIAZEM HCL ER 60 MG PO CP12
120.0000 mg | ORAL_CAPSULE | Freq: Two times a day (BID) | ORAL | Status: DC
  Administered 2017-11-10 – 2017-11-13 (×6): 120 mg via ORAL
  Filled 2017-11-10 (×7): qty 2

## 2017-11-10 MED ORDER — FUROSEMIDE 10 MG/ML IJ SOLN
40.0000 mg | Freq: Once | INTRAMUSCULAR | Status: AC
  Administered 2017-11-10: 40 mg via INTRAVENOUS
  Filled 2017-11-10: qty 4

## 2017-11-10 MED ORDER — LEVETIRACETAM ER 500 MG PO TB24
1000.0000 mg | ORAL_TABLET | Freq: Every day | ORAL | Status: DC
  Administered 2017-11-10 – 2017-11-13 (×4): 1000 mg via ORAL
  Filled 2017-11-10 (×4): qty 2

## 2017-11-10 MED ORDER — IPRATROPIUM-ALBUTEROL 0.5-2.5 (3) MG/3ML IN SOLN: 3.0000 mL | Freq: Four times a day (QID) | RESPIRATORY_TRACT | Status: DC | PRN

## 2017-11-10 MED ORDER — INSULIN ASPART 100 UNIT/ML ~~LOC~~ SOLN
0.0000 [IU] | Freq: Every day | SUBCUTANEOUS | Status: DC
  Administered 2017-11-11: 2 [IU] via SUBCUTANEOUS
  Filled 2017-11-10: qty 1

## 2017-11-10 MED ORDER — METHOCARBAMOL 500 MG PO TABS
500.0000 mg | ORAL_TABLET | Freq: Three times a day (TID) | ORAL | Status: DC | PRN
  Filled 2017-11-10: qty 1

## 2017-11-10 MED ORDER — DIPHENHYDRAMINE HCL 50 MG/ML IJ SOLN
25.0000 mg | Freq: Three times a day (TID) | INTRAMUSCULAR | Status: DC
  Administered 2017-11-10 – 2017-11-13 (×8): 25 mg via INTRAVENOUS
  Filled 2017-11-10 (×8): qty 1

## 2017-11-10 MED ORDER — ENOXAPARIN SODIUM 40 MG/0.4ML ~~LOC~~ SOLN
40.0000 mg | Freq: Two times a day (BID) | SUBCUTANEOUS | Status: DC
  Administered 2017-11-10 – 2017-11-13 (×6): 40 mg via SUBCUTANEOUS
  Filled 2017-11-10 (×6): qty 0.4

## 2017-11-10 MED ORDER — GABAPENTIN 300 MG PO CAPS
300.0000 mg | ORAL_CAPSULE | Freq: Every day | ORAL | Status: DC
  Administered 2017-11-10 – 2017-11-12 (×3): 300 mg via ORAL
  Filled 2017-11-10 (×3): qty 1

## 2017-11-10 MED ORDER — CLOPIDOGREL BISULFATE 75 MG PO TABS
75.0000 mg | ORAL_TABLET | Freq: Every day | ORAL | Status: DC
  Administered 2017-11-10 – 2017-11-13 (×4): 75 mg via ORAL
  Filled 2017-11-10 (×4): qty 1

## 2017-11-10 MED ORDER — SODIUM CHLORIDE 0.9% FLUSH
3.0000 mL | Freq: Two times a day (BID) | INTRAVENOUS | Status: DC
  Administered 2017-11-10 – 2017-11-11 (×2): 3 mL via INTRAVENOUS
  Administered 2017-11-11: 6 mL via INTRAVENOUS
  Administered 2017-11-11 – 2017-11-13 (×5): 3 mL via INTRAVENOUS

## 2017-11-10 MED ORDER — UMECLIDINIUM BROMIDE 62.5 MCG/INH IN AEPB
1.0000 | INHALATION_SPRAY | Freq: Every day | RESPIRATORY_TRACT | Status: DC
  Administered 2017-11-10 – 2017-11-13 (×4): 1 via RESPIRATORY_TRACT
  Filled 2017-11-10: qty 7

## 2017-11-10 MED ORDER — ALBUTEROL SULFATE (2.5 MG/3ML) 0.083% IN NEBU: 2.5000 mg | INHALATION_SOLUTION | RESPIRATORY_TRACT | Status: DC | PRN

## 2017-11-10 MED ORDER — PANTOPRAZOLE SODIUM 40 MG PO TBEC
40.0000 mg | DELAYED_RELEASE_TABLET | Freq: Every day | ORAL | Status: DC
  Administered 2017-11-10 – 2017-11-13 (×4): 40 mg via ORAL
  Filled 2017-11-10 (×4): qty 1

## 2017-11-10 MED ORDER — ONDANSETRON HCL 4 MG/2ML IJ SOLN: 4.0000 mg | Freq: Four times a day (QID) | INTRAMUSCULAR | Status: DC | PRN

## 2017-11-10 MED ORDER — FLUTICASONE FUROATE-VILANTEROL 100-25 MCG/INH IN AEPB
1.0000 | INHALATION_SPRAY | Freq: Every day | RESPIRATORY_TRACT | Status: DC
  Administered 2017-11-10 – 2017-11-13 (×4): 1 via RESPIRATORY_TRACT
  Filled 2017-11-10: qty 28

## 2017-11-10 MED ORDER — ALBUTEROL SULFATE 0.63 MG/3ML IN NEBU: 3.0000 mL | INHALATION_SOLUTION | Freq: Three times a day (TID) | RESPIRATORY_TRACT | Status: DC | PRN

## 2017-11-10 MED ORDER — FLUOXETINE HCL 10 MG PO CAPS
10.0000 mg | ORAL_CAPSULE | Freq: Every day | ORAL | Status: DC
  Administered 2017-11-10 – 2017-11-13 (×4): 10 mg via ORAL
  Filled 2017-11-10 (×4): qty 1

## 2017-11-10 MED ORDER — ACETAMINOPHEN 650 MG RE SUPP: 650.0000 mg | Freq: Four times a day (QID) | RECTAL | Status: DC | PRN

## 2017-11-10 MED ORDER — ATORVASTATIN CALCIUM 20 MG PO TABS
80.0000 mg | ORAL_TABLET | Freq: Every day | ORAL | Status: DC
  Administered 2017-11-10 – 2017-11-13 (×4): 80 mg via ORAL
  Filled 2017-11-10 (×4): qty 4

## 2017-11-10 MED ORDER — ACETAMINOPHEN 325 MG PO TABS: 650.0000 mg | ORAL_TABLET | Freq: Four times a day (QID) | ORAL | Status: DC | PRN

## 2017-11-10 MED ORDER — METHYLPREDNISOLONE SODIUM SUCC 125 MG IJ SOLR
125.0000 mg | Freq: Once | INTRAMUSCULAR | Status: AC
  Administered 2017-11-10: 125 mg via INTRAVENOUS
  Filled 2017-11-10: qty 2

## 2017-11-10 MED ORDER — FLUTICASONE-UMECLIDIN-VILANT 100-62.5-25 MCG/INH IN AEPB: 1.0000 | INHALATION_SPRAY | Freq: Every day | RESPIRATORY_TRACT | Status: DC

## 2017-11-10 MED ORDER — ONDANSETRON HCL 4 MG PO TABS: 4.0000 mg | ORAL_TABLET | Freq: Four times a day (QID) | ORAL | Status: DC | PRN

## 2017-11-10 MED ORDER — TIOTROPIUM BROMIDE MONOHYDRATE 18 MCG IN CAPS
18.0000 ug | ORAL_CAPSULE | Freq: Every day | RESPIRATORY_TRACT | Status: DC
  Filled 2017-11-10: qty 5

## 2017-11-10 MED ORDER — IPRATROPIUM BROMIDE 0.03 % NA SOLN
2.0000 | Freq: Two times a day (BID) | NASAL | Status: DC
  Administered 2017-11-11 – 2017-11-13 (×4): 2 via NASAL
  Filled 2017-11-10: qty 30

## 2017-11-10 MED ORDER — INSULIN ASPART 100 UNIT/ML ~~LOC~~ SOLN
0.0000 [IU] | Freq: Three times a day (TID) | SUBCUTANEOUS | Status: DC
  Administered 2017-11-11 (×2): 3 [IU] via SUBCUTANEOUS
  Administered 2017-11-11 – 2017-11-12 (×2): 5 [IU] via SUBCUTANEOUS
  Administered 2017-11-12: 3 [IU] via SUBCUTANEOUS
  Administered 2017-11-12 – 2017-11-13 (×2): 5 [IU] via SUBCUTANEOUS
  Administered 2017-11-13: 3 [IU] via SUBCUTANEOUS
  Filled 2017-11-10 (×8): qty 1

## 2017-11-10 MED ORDER — DIPHENHYDRAMINE HCL 50 MG/ML IJ SOLN
25.0000 mg | Freq: Once | INTRAMUSCULAR | Status: AC
  Administered 2017-11-10: 25 mg via INTRAVENOUS
  Filled 2017-11-10: qty 1

## 2017-11-10 NOTE — Progress Notes (Signed)
Anticoagulation monitoring(Lovenox):  77 yo female ordered Lovenox 40 mg Q24h  Filed Weights   11/10/17 1111 11/10/17 1327  Weight: 276 lb (125.2 kg) 276 lb (125.2 kg)   BMI 43.22    Lab Results  Component Value Date   CREATININE 1.73 (H) 11/10/2017   CREATININE 1.77 (H) 10/29/2017   CREATININE 1.68 (H) 10/28/2017   Estimated Creatinine Clearance: 38 mL/min (A) (by C-G formula based on SCr of 1.73 mg/dL (H)). Hemoglobin & Hematocrit     Component Value Date/Time   HGB 10.1 (L) 11/10/2017 1850   HGB 12.6 03/27/2017 0948   HCT 30.6 (L) 11/10/2017 1850   HCT 37.9 03/27/2017 0948     Per Protocol for Patient with estCrcl > 30 ml/min and BMI > 40, will transition to Lovenox 40 mg Q24h.

## 2017-11-10 NOTE — H&P (Signed)
[facility] NAME: Katherine [name]    MR#:  [id number]  DATE OF BIRTH:  [birth year]  DATE OF ADMISSION:  11/10/2017  PRIMARY CARE PHYSICIAN: Valerie Roys, DO   REQUESTING/REFERRING PHYSICIAN: Lisa Roca, MD  CHIEF COMPLAINT:   Chief Complaint  Patient presents with  . Shortness of Breath    HISTORY OF PRESENT ILLNESS:  Katherine [name]  is a 77 y.o. female with a known history of HTN, COPD, CKD III, anxiety/depression, and angioedema who presented to the ED with altered mental status that started this morning. She was also noted to have some difficulty talking due to swelling of her tongue and lips. Patient does not know how long this has been going on. She has not taken any new medications. She endorses shortness of breath and lower extremity edema for the last couple of days. She uses 2L of O2 at home. She endorses cough productive of yellow sputum. She denies any fevers or chills.  In the ED, she was initially placed on 6L O2 by [place] and then weaned to 4L. WBC normal. CXR negative. CT head was negative for intracranial abnormality, but did show extensive mastoid air cell disease and persistent paranasal sinus disease with stable air-fluid level in the right sphenoid sinus. She was given solumedrol and benadryl for possible angioedema. Hospitalist team was called for admission.  PAST MEDICAL HISTORY:   Past Medical History:  Diagnosis Date  . Angioedema   . Anxiety   . Anxiety and depression   . Chronic constipation   . Chronic kidney disease    stage 3  . COPD (chronic obstructive pulmonary disease) ([place])   . Gallstones   . GERD (gastroesophageal reflux disease)   . Hypertension   . Left ventricular hypertrophy   . Osteoarthritis   . Prurigo nodularis   . Tobacco abuse   . Vitamin D deficiency disease     PAST SURGICAL HISTORY:   Past Surgical History:  Procedure Laterality Date  . CHOLECYSTECTOMY    .  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   Procedure: TOTAL KNEE ARTHROPLASTY;  Surgeon: Hessie Knows, MD;  Location: [facility] ORS;  Service: Orthopedics;  Laterality: Right;    SOCIAL HISTORY:   Social History   Tobacco Use  . Smoking status: Former Smoker    Packs/day: 0.50    Years: 60.00    Pack years: 30.00    Types: Cigarettes  . Smokeless tobacco: Never Used  Substance Use Topics  . Alcohol use: No    Alcohol/week: 0.0 standard drinks    Comment: rare    FAMILY HISTORY:   Family History  Problem Relation Age of Onset  . Alcohol abuse Mother   . Sickle cell anemia Daughter   . Hypertension Son   . Cancer Neg Hx   . COPD Neg Hx   . Diabetes Neg Hx   . Heart disease Neg Hx   . Stroke Neg Hx     DRUG ALLERGIES:   Allergies  Allergen Reactions  . Bee Venom Swelling  . Enalapril Maleate Swelling    REVIEW OF SYSTEMS:   Review of Systems  Constitutional: Negative for chills and fever.  HENT: Negative for congestion, sinus pain and sore throat.   Eyes: Negative for blurred vision and double vision.  Respiratory: Positive for cough, sputum production and shortness of breath.   Cardiovascular: Positive for leg swelling. Negative  for chest pain, palpitations and orthopnea.  Gastrointestinal: Negative for abdominal pain, diarrhea, nausea and vomiting.  Genitourinary: Negative for dysuria and frequency.  Musculoskeletal: Positive for falls and joint pain. Negative for back pain and neck pain.  Neurological: Positive for speech change. Negative for dizziness and headaches.  Psychiatric/Behavioral: Negative for depression. The patient is not nervous/anxious.     MEDICATIONS AT HOME:   Prior to Admission medications   Medication Sig Start Date End Date Taking? Authorizing Provider  acetaminophen (TYLENOL) 650 MG CR tablet Take 650-1,300 mg by mouth every 8 (eight) hours as needed for pain.   Yes [provider]    albuterol (ACCUNEB) 0.63 MG/3ML nebulizer solution Take 3 mLs by nebulization 3 (three) times daily as needed for wheezing.   Yes [provider]  albuterol (PROVENTIL HFA;VENTOLIN HFA) 108 (90 Base) MCG/ACT inhaler Inhale 2 puffs into the lungs every 4 (four) hours as needed for wheezing or shortness of breath.   Yes [provider]  ALPRAZolam Duanne Moron) 0.5 MG tablet Take 0.5 mg by mouth 2 (two) times daily as needed for anxiety.   Yes [provider]  atorvastatin (LIPITOR) 80 MG tablet Take 80 mg by mouth daily.   Yes [provider]  cetirizine (ZYRTEC) 10 MG tablet Take 5 mg by mouth at bedtime.   Yes [provider]  cholecalciferol (VITAMIN D) 1000 units tablet Take 1,000 Units by mouth daily.   Yes [provider]  clopidogrel (PLAVIX) 75 MG tablet Take 75 mg by mouth daily.   Yes [provider]  diltiazem (CARDIZEM SR) 120 MG 12 hr capsule Take 120 mg by mouth 2 (two) times daily.   Yes [provider]  docusate sodium (COLACE) 100 MG capsule Take 100 mg by mouth daily.   Yes [provider]  FLUoxetine (PROZAC) 10 MG capsule Take 10 mg by mouth daily.   Yes [provider]  Fluticasone-Umeclidin-Vilant 100-62.5-25 MCG/INH AEPB Inhale 1 puff into the lungs daily.   Yes [provider]  gabapentin (NEURONTIN) 300 MG capsule Take 300 mg by mouth at bedtime.   Yes [provider]  hydrALAZINE (APRESOLINE) 25 MG tablet Take 25 mg by mouth 3 (three) times daily.   Yes [provider]  ipratropium (ATROVENT) 0.03 % nasal spray Place 2 sprays into both nostrils every 12 (twelve) hours.   Yes [provider]  ipratropium-albuterol (DUONEB) 0.5-2.5 (3) MG/3ML SOLN Take 3 mLs by nebulization every 6 (six) hours as needed (shortness of breath).   Yes [provider]  levETIRAcetam (KEPPRA XR) 500 MG 24 hr tablet Take 2 tablets (1,000 mg total) by mouth daily. 10/08/17   Yes Rinehuls, Early Chars, PA-C  linagliptin (TRADJENTA) 5 MG TABS tablet Take 1 tablet (5 mg total) by mouth daily. 10/10/17  Yes Donzetta Starch, NP  meclizine (ANTIVERT) 25 MG tablet Take 25 mg by mouth 2 (two) times daily as needed for dizziness.   Yes [provider]  methocarbamol (ROBAXIN) 500 MG tablet Take 500 mg by mouth every 8 (eight) hours as needed for muscle spasms.   Yes [provider]  pantoprazole (PROTONIX) 40 MG tablet Take 40 mg by mouth daily.   Yes [provider]  tiotropium (SPIRIVA) 18 MCG inhalation capsule Place 18 mcg into inhaler and inhale daily.   Yes [provider]      VITAL SIGNS:  Blood pressure 112/81, pulse 90, temperature 98.4 F (36.9 C), temperature source Oral, resp.  rate 19, height 5\' 7"  (1.702 m), weight 125.2 kg, SpO2 99 %.  PHYSICAL EXAMINATION:  Physical Exam  GENERAL:  77 y.o.-year-old patient lying in the bed with no acute distress.  EYES: Pupils equal, round, reactive to light and accommodation. No scleral icterus. Extraocular muscles intact.  HEENT: Head atraumatic, normocephalic. Oropharynx and nasopharynx clear. Tongue and lips appear enlarged. NECK:  Supple, no jugular venous distention. No thyroid enlargement, no tenderness.  LUNGS: Normal work of breathing, +upper airway noises, [place] in place, no crackles or wheezing, +decreased air movement throughout all lung fields. CARDIOVASCULAR: RRR, S1, S2 normal. No murmurs, rubs, or gallops.  ABDOMEN: Soft, nontender, nondistended. Bowel sounds present. No organomegaly or mass.  EXTREMITIES: +1-2 pitting edema to the knees bilaterally, cyanosis, or clubbing.  NEUROLOGIC: Cranial nerves II through XII are intact. Muscle strength 5/5 in all extremities. Sensation intact. Gait not checked.  PSYCHIATRIC: The patient is alert and oriented x 3.  SKIN: No obvious rash, lesion, or ulcer.   LABORATORY PANEL:   CBC Recent Labs  Lab 11/10/17 1138  WBC 6.7  HGB 9.6*    HCT 28.2*  PLT 195   ------------------------------------------------------------------------------------------------------------------  Chemistries  Recent Labs  Lab 11/10/17 1138  NA 146*  K 4.5  CL 107  CO2 30  GLUCOSE 137*  BUN 19  CREATININE 1.73*  CALCIUM 8.7*  AST 20  ALT 19  ALKPHOS 60  BILITOT 0.6   ------------------------------------------------------------------------------------------------------------------  Cardiac Enzymes Recent Labs  Lab 11/10/17 1138  TROPONINI <0.03   ------------------------------------------------------------------------------------------------------------------  RADIOLOGY:  Ct Head Wo Contrast  Result Date: 11/10/2017 CLINICAL DATA:  Dysarthria EXAM: CT HEAD WITHOUT CONTRAST TECHNIQUE: Contiguous axial images were obtained from the base of the skull through the vertex without intravenous contrast. COMPARISON:  October 26, 2017 FINDINGS: Brain: Mild diffuse atrophy is stable. There is no intracranial mass, hemorrhage, extra-axial fluid collection, or midline shift. There is mild patchy small vessel disease in the centra semiovale bilaterally, slightly more on the right than on the left, stable. No new brain parenchymal lesions evident. No acute infarct evident. Vascular: No hyperdense vessel. There is calcification in each carotid siphon region. Skull: The bony calvarium appears intact. Sinuses/Orbits: There is an air-fluid level in the right sphenoid sinus, also present on CT from 2 weeks prior. There is mucosal thickening in several ethmoid air cells which appears stable. Visualized orbits appear symmetric bilaterally. Other: Extensive mastoid air cell disease persists without change. IMPRESSION: Atrophy with mild patchy periventricular small vessel disease, stable. No acute infarct evident. No mass or hemorrhage. There are foci of arterial vascular calcification. Extensive mastoid air cell disease persists. Persistent paranasal sinus  disease noted with air-fluid level in the right sphenoid sinus, stable. Electronically Signed   By: Lowella Grip III M.D.   On: 11/10/2017 12:25   Dg Chest Port 1 View  Result Date: 11/10/2017 CLINICAL DATA:  Shortness of breath EXAM: PORTABLE CHEST 1 VIEW COMPARISON:  October 28, 2017 FINDINGS: There is atelectatic change in the bases. Lungs elsewhere clear. Heart is mildly enlarged with pulmonary vascularity normal. No adenopathy. There is aortic atherosclerosis. No evident bone lesions. IMPRESSION: Bibasilar atelectasis. No edema or consolidation. Stable cardiac prominence. There is aortic atherosclerosis. Aortic Atherosclerosis (ICD10-I70.0). Electronically Signed   By: Lowella Grip III M.D.   On: 11/10/2017 12:22   Dg Hip Unilat  With Pelvis 2-3 Views Right  Result Date: 11/09/2017 CLINICAL DATA:  Pain following fall EXAM: DG HIP (WITH OR WITHOUT PELVIS) 2-3V RIGHT COMPARISON:  None. FINDINGS: Frontal pelvis as well as frontal and lateral right hip images were obtained. There is no fracture or dislocation. There is moderate symmetric narrowing of each hip joint. No erosive change. Sacroiliac joints appear normal bilaterally. IMPRESSION: Moderate symmetric narrowing of each hip joint. No fracture or dislocation. Electronically Signed   By: Lowella Grip III M.D.   On: 11/09/2017 10:23      IMPRESSION AND PLAN:   Acute on chronic hypoxic respiratory failure- requiring 4L O2 in the ED (uses 2L O2 at home). May be related to angioedema vs COPD exacerbation. No signs of pneumonia on CXR. 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 Has a history of this in the past secondary to enalapril, although not on ACE/ARB currently. No recent insect bites or new medications. - continue solumedrol and benadryl - continue home zyrtec  Acute  exacerbation of COPD - continue solumedrol - continue home inhalers - duonebs prn  AMS- resolved. Reported to be confused by her family but patient is alert and oriented x 3 and answering questions appropriately. CT head negative for acute abnormalities. CO2 64 on VBG. Has a history of seizures, but no seizure-like activity recently. - monitor - consider MRI, EEG, ammonia, HIV, RPR, neuro consult if AMS returns  Lower extremity edema- 1-2+ pitting edema to the knees bilaterally. Recent ECHO 09/2017 without CHF. - lasix 40mg  IV x 1 - apply ted hose  Hypertension- BPs soft in the ED - continue cardizem - hold hydralazine for now until BPs improve  CAD- stable, no active chest pain. Troponin negative. - continue plavix  Type 2 diabetes- stable. Last a1c 7.7. Uses trajenta at home. - moderate SSI  CKD III- Cr at baseline. - avoid nephrotoxic agents   Normocytic anemia- Hgb 9.6 here, baseline 10-11. - check anemia panel  Hyperlipidemia- stable - continue lipitor  History of seizures- no seizure-like activity recently. - continue home keprra  Anxiety/depression- stable - continue home xanax and prozac  All the records are reviewed and case discussed with ED provider. Management plans discussed with the patient, family and they are in agreement.  CODE STATUS: full  TOTAL TIME TAKING CARE OF THIS PATIENT: 45 minutes.    Berna Spare Kailen Hinkle M.D on 11/10/2017 at 4:31 PM  Between 7am to 6pm - Pager - [phone]  After 6pm go to [email or web address] - Proofreader  [facility]  Hospitalists  Office  [phone]  CC: Primary care physician; Valerie Roys, DO   Note: This dictation was prepared with Dragon dictation along with smaller phrase technology. Any transcriptional errors that result from this process are unintentional.

## 2017-11-10 NOTE — Progress Notes (Signed)
Family Meeting Note  Advance Directive:no  Today a meeting took place with the Patient.  Patient is able to participate.  The following clinical team members were present during this meeting:MD  The following were discussed:Patient's diagnosis: , Patient's progosis: Unable to determine and Goals for treatment: Full Code  Additional follow-up to be provided: prn  Time spent during discussion:20 minutes  Evette Doffing, MD

## 2017-11-10 NOTE — ED Triage Notes (Signed)
EMS states that patients family called from home due to suspected stroke.  Patient has a history of TIA, COPD, HTN, diabetes, and seizures.  Patient came in with incomprehensive speech.  Patients lip and face are swollen but neg facial droop and neg arm drift.  Patient follows commands and is A&Ox4.  Patient is on 2L home oxygen and is now on 6L via EMS.  Lung sounds are clear but patient is stating having difficulty breathing "a little bit".  Patients main complaint is her left knee pain related to yesterdays fall.

## 2017-11-10 NOTE — ED Provider Notes (Signed)
[facility] Emergency Department Provider Note ____________________________________________   I have reviewed the triage vital signs and the triage nursing note.  HISTORY  Chief Complaint Shortness of Breath   Historian Level 5 Caveat History Limited by poor historian, not talking much, altered mental status  HPI Katherine [name] is a 77 y.o. female presenting from home by EMS after family noted decreased mental status today.  I spoke with husband later -- he left this morning and she was sleeping when he left this morning.  He sent family members to check on her and they noted that she was slurring her speech and was decreased alertness and called EMS to bring her to the hospital.  She was evaluate the hospital yesterday after a fall with hip pain, there is no history of reported head injury.  Patient is able to talk, but is mumbling little bit.  Last known normal would have been last night.   History obtained later from the husband indicated that she seemed to be having some trouble with her right leg in terms of walking, however here she appears to be moving it.   Past Medical History:  Diagnosis Date  . Angioedema   . Anxiety   . Anxiety and depression   . Chronic constipation   . Chronic kidney disease    stage 3  . COPD (chronic obstructive pulmonary disease) ([place])   . Gallstones   . GERD (gastroesophageal reflux disease)   . Hypertension   . Left ventricular hypertrophy   . Osteoarthritis   . Prurigo nodularis   . Tobacco abuse   . Vitamin D deficiency disease     Patient Active Problem List   Diagnosis Date Noted  . Pressure injury of skin 10/29/2017  . Sepsis ([place]) 10/24/2017  . Acute kidney injury superimposed on CKD ([place]) 10/24/2017  . Severe sepsis ([place]) 10/24/2017  . Possible Seizures ([place]) 10/08/2017  . Hyperlipemia 10/08/2017  . 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 Stroke-like episode ([place]) s/p IV tpa  10/04/2017  . Palliative care by specialist   . HCAP (healthcare-associated pneumonia) 09/11/2017  . Elevated rheumatoid factor 09/05/2017  . COPD exacerbation ([place]) 08/27/2017  . Frequent hospital admissions 08/22/2017  . Aphasia 06/28/2017  . Acute on chronic respiratory failure with hypoxia and hypercapnia ([place]) 06/12/2017  . Hand pain 03/21/2017  . Dry skin 03/21/2017  . Pain in finger of left hand 09/12/2016  . Chronic fatigue 06/12/2016  . Left knee pain 06/12/2016  . Goals of care, counseling/discussion 03/13/2016  . Primary localized osteoarthritis of right knee 02/08/2016  . OSA and COPD overlap syndrome ([place]) 09/16/2015  . Rotator cuff syndrome 09/07/2015  . 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 Sleep disturbance 04/14/2015  . Coronary artery disease 03/14/2015  . 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 Anxiety   . Tobacco abuse   . Prurigo nodularis   . GERD (gastroesophageal reflux disease)   . COPD (chronic obstructive pulmonary disease) ([place])   . Osteoarthritis   . Vitamin D deficiency disease   . Chronic constipation   . CKD (chronic kidney disease), stage III ([place])   . LVH (left ventricular hypertrophy) 05/20/2013  . Essential hypertension 05/20/2013  . Morbid obesity ([place]) 05/20/2013    Past Surgical History:  Procedure Laterality Date  . CHOLECYSTECTOMY    . 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   Procedure: TOTAL KNEE ARTHROPLASTY;  Surgeon: Hessie Knows, MD;  Location: [facility] ORS;  Service: Orthopedics;  Laterality: Right;    Prior to Admission medications   Medication Sig Start Date End Date Taking? Authorizing Provider  acetaminophen (TYLENOL) 650 MG CR tablet Take 650-1,300 mg by mouth every 8 (eight) hours as needed for pain.    [provider]  albuterol (ACCUNEB) 0.63 MG/3ML nebulizer solution Take 3 mLs by nebulization 3 (three) times daily as needed for wheezing.     [provider]  albuterol (PROVENTIL HFA;VENTOLIN HFA) 108 (90 Base) MCG/ACT inhaler Inhale 2 puffs into the lungs every 4 (four) hours as needed for wheezing or shortness of breath.    [provider]  ALPRAZolam Duanne Moron) 0.5 MG tablet Take 0.5 mg by mouth 2 (two) times daily as needed for anxiety.    [provider]  atorvastatin (LIPITOR) 80 MG tablet Take 80 mg by mouth daily.    [provider]  cetirizine (ZYRTEC) 10 MG tablet Take 5 mg by mouth at bedtime.    [provider]  cetirizine (ZYRTEC) 10 MG tablet TAKE 1/2 TABLET(5 MG) BY MOUTH AT BEDTIME 10/25/17   Johnson, Megan P, DO  cholecalciferol (VITAMIN D) 1000 units tablet Take 1,000 Units by mouth daily.    [provider]  clopidogrel (PLAVIX) 75 MG tablet Take 75 mg by mouth daily.    [provider]  diltiazem (CARDIZEM SR) 120 MG 12 hr capsule Take 120 mg by mouth 2 (two) times daily.    [provider]  docusate sodium (COLACE) 100 MG capsule Take 100 mg by mouth daily.    [provider]  FLUoxetine (PROZAC) 10 MG capsule Take 10 mg by mouth daily.    [provider]  Fluticasone-Umeclidin-Vilant 100-62.5-25 MCG/INH AEPB Inhale 1 puff into the lungs daily.    [provider]  gabapentin (NEURONTIN) 300 MG capsule Take 300 mg by mouth at bedtime.    [provider]  hydrALAZINE (APRESOLINE) 25 MG tablet Take 25 mg by mouth 3 (three) times daily.    [provider]  ipratropium (ATROVENT) 0.03 % nasal spray Place 2 sprays into both nostrils every 12 (twelve) hours.    [provider]  ipratropium-albuterol (DUONEB) 0.5-2.5 (3) MG/3ML SOLN Take 3 mLs by nebulization every 6 (six) hours as needed (shortness of breath).    [provider]  levETIRAcetam (KEPPRA XR) 500 MG 24 hr tablet Take 2 tablets (1,000 mg total) by mouth daily. 10/08/17   Rinehuls, David L, PA-C  linagliptin (TRADJENTA) 5 MG TABS  tablet Take 1 tablet (5 mg total) by mouth daily. 10/10/17   Donzetta Starch, NP  meclizine (ANTIVERT) 25 MG tablet Take 25 mg by mouth 2 (two) times daily as needed for dizziness.    [provider]  methocarbamol (ROBAXIN) 500 MG tablet Take 500 mg by mouth every 8 (eight) hours as needed for muscle spasms.    [provider]  pantoprazole (PROTONIX) 40 MG tablet Take 40 mg by mouth daily.    [provider]  tiotropium (SPIRIVA) 18 MCG inhalation capsule Place 18 mcg into inhaler and inhale daily.    [provider]    Allergies  Allergen Reactions  . Bee Venom Swelling  . Enalapril Maleate Swelling    Family History  Problem Relation Age of Onset  . Alcohol abuse Mother   . Sickle cell anemia Daughter   . Hypertension Son   . Cancer Neg Hx   .  COPD Neg Hx   . Diabetes Neg Hx   . Heart disease Neg Hx   . Stroke Neg Hx     Social History Social History   Tobacco Use  . Smoking status: Former Smoker    Packs/day: 0.50    Years: 60.00    Pack years: 30.00    Types: Cigarettes  . Smokeless tobacco: Never Used  Substance Use Topics  . Alcohol use: No    Alcohol/week: 0.0 standard drinks    Comment: rare  . Drug use: No    Review of Systems  Unable to obtain, but patient denies headache, chest pain, trouble breathing  ____________________________________________   PHYSICAL EXAM:  VITAL SIGNS: ED Triage Vitals  Enc Vitals Group     BP 11/10/17 1110 109/65     Pulse Rate 11/10/17 1110 93     Resp 11/10/17 1130 (!) 27     Temp 11/10/17 1110 98.4 F (36.9 C)     Temp Source 11/10/17 1110 Oral     SpO2 11/10/17 1110 94 %     Weight 11/10/17 1111 276 lb (125.2 kg)     Height --      Head Circumference --      Peak Flow --      Pain Score 11/10/17 1111 10     Pain Loc --      Pain Edu? --      Excl. in [place]? --      Constitutional: Alert to voice, answers questions.  HEENT      Head: Normocephalic and atraumatic.       Eyes: Conjunctivae are normal. Pupils equal and round.       Ears:         Nose: No congestion/rhinnorhea.      Mouth/Throat: Mucous membranes are moderately dry.  Large tongue, but she is able to retract her back in her mouth and talk, and certain baseline.      Neck: No stridor. Cardiovascular/Chest: Normal rate, regular rhythm.  No murmurs, rubs, or gallops. Respiratory: Normal respiratory effort without tachypnea nor retractions. Breath sounds are clear and equal bilaterally.  Mild rhonchi. Gastrointestinal: Soft. No distention, no guarding, no rebound. Nontender.  Obese Genitourinary/rectal:Deferred Musculoskeletal: Nontender with normal range of motion in all extremities. No joint effusions.  No lower extremity tenderness.  Neurologic: No facial droop.  Some slurred speech.  In 4 extremities, patient is unable to cooperate with a full neurologic exam, but she does not appear to have any obvious deficit here.   Skin:  Skin is warm, dry and intact. No rash noted. Psychiatric: Mood and affect are normal. Speech and behavior are normal. Patient exhibits appropriate insight and judgment.   ____________________________________________  LABS (pertinent positives/negatives) I, Lisa Roca, MD the attending physician have reviewed the labs noted below.  Labs Reviewed  GLUCOSE, CAPILLARY - Abnormal; Notable for the following components:      Result Value   Glucose-Capillary 132 (*)    All other components within normal limits  COMPREHENSIVE METABOLIC PANEL - Abnormal; Notable for the following components:   Sodium 146 (*)    Glucose, Bld 137 (*)    Creatinine, Ser 1.73 (*)    Calcium 8.7 (*)    Total Protein 6.3 (*)    Albumin 2.9 (*)    GFR calc non Af Amer 27 (*)    GFR calc Af Amer 32 (*)    All other components within normal limits  CBC WITH DIFFERENTIAL/PLATELET -  Abnormal; Notable for the following components:   RBC 3.27 (*)    Hemoglobin 9.6 (*)    HCT 28.2 (*)    RDW 20.3  (*)    Lymphs Abs 0.9 (*)    All other components within normal limits  URINALYSIS, COMPLETE (UACMP) WITH MICROSCOPIC - Abnormal; Notable for the following components:   Color, Urine YELLOW (*)    APPearance CLEAR (*)    Leukocytes, UA TRACE (*)    All other components within normal limits  TROPONIN I  LACTIC ACID, PLASMA  BLOOD GAS, VENOUS    ____________________________________________    EKG I, Lisa Roca, MD, the attending physician have personally viewed and interpreted all ECGs.  None ____________________________________________  RADIOLOGY   CT without contrast, radiologist interpretation reviewed:  IMPRESSION: Atrophy with mild patchy periventricular small vessel disease, stable. No acute infarct evident. No mass or hemorrhage.  There are foci of arterial vascular calcification. Extensive mastoid air cell disease persists. Persistent paranasal sinus disease noted with air-fluid level in the right sphenoid sinus, stable. __________________________________________  PROCEDURES  Procedure(s) performed: None  Procedures  Critical Care performed: None   ____________________________________________  ED COURSE / ASSESSMENT AND PLAN  Pertinent labs & imaging results that were available during my care of the patient were reviewed by me and considered in my medical decision making (see chart for details).     Unclear history report from EMS, sounds like patient has slurred speech and decreased level of consciousness compared to her baseline was report.  On exam it seems like her tongue was a little bit enlarged, raising concern about possible angioedema, however patient is unable to give a history whether or not she feels like her tongue is swollen at all and she is little confused and she is able to talk and poor tongue back in.  No focal neurologic deficit clearly on exam, but altered mental, so CT head was ordered.  CT head without acute stroke seen.  I  was then able to get in touch with her husband who indicated he saw her last normal last night, and then family members called him and stated that the patient did have swelling of the tongue and lips.  I reviewed patient's medication list, she is not on an ACE or an arb that I can tell, he is going to bring her medications up to the hospital.  I reviewed her chart document from yesterday, she does have a history of COPD, will add on VBG in case this is hypercarbia causing altered mental status.  Discussed with hospitalist for admission.      CONSULTATIONS: Hospitalist for admission   Patient / Family / Caregiver informed of clinical course, medical decision-making process, and agree with plan.    ___________________________________________   FINAL CLINICAL IMPRESSION(S) / ED DIAGNOSES   Final diagnoses:  Altered mental status, unspecified altered mental status type  Angioedema, initial encounter      ___________________________________________         Note: This dictation was prepared with Dragon dictation. Any transcriptional errors that result from this process are unintentional    Lisa Roca, MD 11/10/17 1526

## 2017-11-11 LAB — GLUCOSE, CAPILLARY
GLUCOSE-CAPILLARY: 223 mg/dL — AB (ref 70–99)
GLUCOSE-CAPILLARY: 221 mg/dL — AB (ref 70–99)
Glucose-Capillary: 183 mg/dL — ABNORMAL HIGH (ref 70–99)
Glucose-Capillary: 181 mg/dL — ABNORMAL HIGH (ref 70–99)

## 2017-11-11 LAB — CBC
HEMATOCRIT: 29.5 % — AB (ref 35.0–47.0)
Hemoglobin: 9.7 g/dL — ABNORMAL LOW (ref 12.0–16.0)
MCH: 28.2 pg (ref 26.0–34.0)
MCHC: 32.7 g/dL (ref 32.0–36.0)
MCV: 86.3 fL (ref 80.0–100.0)
Platelets: 213 10*3/uL (ref 150–440)
RBC: 3.42 MIL/uL — ABNORMAL LOW (ref 3.80–5.20)
RDW: 20.6 % — ABNORMAL HIGH (ref 11.5–14.5)
WBC: 5.5 10*3/uL (ref 3.6–11.0)

## 2017-11-11 LAB — BASIC METABOLIC PANEL
Anion gap: 9 (ref 5–15)
BUN: 24 mg/dL — ABNORMAL HIGH (ref 8–23)
CHLORIDE: 105 mmol/L (ref 98–111)
CO2: 29 mmol/L (ref 22–32)
Calcium: 8.8 mg/dL — ABNORMAL LOW (ref 8.9–10.3)
Creatinine, Ser: 1.68 mg/dL — ABNORMAL HIGH (ref 0.44–1.00)
GFR calc Af Amer: 33 mL/min — ABNORMAL LOW (ref >60)
GFR calc non Af Amer: 28 mL/min — ABNORMAL LOW (ref >60)
GLUCOSE: 184 mg/dL — AB (ref 70–99)
Potassium: 4.6 mmol/L (ref 3.5–5.1)
Sodium: 143 mmol/L (ref 135–145)

## 2017-11-11 LAB — VITAMIN B12: Vitamin B-12: 147 pg/mL — ABNORMAL LOW (ref 180–914)

## 2017-11-11 MED ORDER — PREDNISONE 10 MG (21) PO TBPK
20.0000 mg | ORAL_TABLET | Freq: Every evening | ORAL | Status: AC
  Administered 2017-11-11: 20 mg via ORAL

## 2017-11-11 MED ORDER — PREDNISONE 10 MG (21) PO TBPK
20.0000 mg | ORAL_TABLET | Freq: Every evening | ORAL | Status: AC
  Administered 2017-11-12: 20 mg via ORAL

## 2017-11-11 MED ORDER — PREDNISONE 10 MG (21) PO TBPK
20.0000 mg | ORAL_TABLET | Freq: Every morning | ORAL | Status: AC
  Administered 2017-11-11: 20 mg via ORAL
  Filled 2017-11-11: qty 21

## 2017-11-11 MED ORDER — PREDNISONE 10 MG (21) PO TBPK
10.0000 mg | ORAL_TABLET | Freq: Three times a day (TID) | ORAL | Status: AC
  Administered 2017-11-12 (×3): 10 mg via ORAL

## 2017-11-11 MED ORDER — ORAL CARE MOUTH RINSE
15.0000 mL | Freq: Two times a day (BID) | OROMUCOSAL | Status: DC
  Administered 2017-11-11 – 2017-11-12 (×3): 15 mL via OROMUCOSAL

## 2017-11-11 MED ORDER — PREDNISONE 10 MG (21) PO TBPK
10.0000 mg | ORAL_TABLET | Freq: Four times a day (QID) | ORAL | Status: DC
  Administered 2017-11-13: 10 mg via ORAL

## 2017-11-11 MED ORDER — PREDNISONE 10 MG (21) PO TBPK
10.0000 mg | ORAL_TABLET | ORAL | Status: AC
  Administered 2017-11-11: 10 mg via ORAL

## 2017-11-11 NOTE — Plan of Care (Signed)
Moderate dyspnea with exertion to bedside commode.  External catheter in place until activity tolerated.

## 2017-11-11 NOTE — Evaluation (Signed)
Physical Therapy Evaluation Patient Details Name: Katherine Katherine [name] MRN: [id number] DOB: June 11, 1940 Today's Date: 11/11/2017   History of Present Illness  Katherine Katherine [name] is a 77yo female who comes [facility] on 9/28 d/t AMS and diffculty talking d/t tongue and lip swelling. Pt was recently hospitalized ~2WA. Pt admitted with Acute on chronic resp failure. PMH: HTN, COPD, CKD3, GAD/depression, angioedema, HCAP, ?TIA  Clinical Impression  Pt admitted with above diagnosis. Pt currently with functional limitations due to the deficits listed below (see "PT Problem List"). Upon entry, pt in bed, no family/caregiver present. The pt is awake and agreeable to participate. The pt is alert and oriented x3, pleasant, conversational, and following simple commands consistently. Bed mobility and transfers requires near maximal effort, but technique is efficient and safe; AMB at bed side with <63ft with drop in SpO2 and quick onset DOE on 4L/min. Functional mobility assessment demonstrates increased effort/time requirements, poor tolerance, but no frank need for physical assistance, whereas the patient performed these at a higher level of independence PTA. Empirically, the patient demonstrates increased risk of recurrent falls AEB gait speed <0.52m/s and forward reach <5". Pt will benefit from skilled PT intervention to increase independence and safety with basic mobility in preparation for discharge to the venue listed below.       Follow Up Recommendations Home health PT;Supervision for mobility/OOB;Supervision/Assistance - 24 hour    Equipment Recommendations  None recommended by PT    Recommendations for Other Services       Precautions / Restrictions Precautions Precautions: Fall      Mobility  Bed Mobility Overal bed mobility: Needs Assistance Bed Mobility: Supine to Sit     Supine to sit: Supervision     General bed mobility comments: labored, but able   Transfers Overall transfer level: Needs  assistance Equipment used: Rolling walker (2 wheeled) Transfers: Sit to/from Stand Sit to Stand: Supervision         General transfer comment: near maximal effort, good technique with RW and push up from bed with closed fist. Perfoms 4x in session.   Ambulation/Gait Ambulation/Gait assistance: Supervision Gait Distance (Feet): 7 Feet Assistive device: Rolling walker (2 wheeled)       General Gait Details: safe RW use, 3.19ft forward then backward, and sits Katherine [name] DOE, O2 sats dropped to 86% on 4L (baseline flowrate 2L)   Stairs            Wheelchair Mobility    Modified Rankin (Stroke Patients Only)       Balance Overall balance assessment: Modified Independent;History of Falls                                           Pertinent Vitals/Pain Pain Assessment: No/denies pain    Home Living Family/patient expects to be discharged to:: Private residence Living Arrangements: Other (Comment) Available Help at Discharge: Family Type of Home: Apartment Home Access: Level entry Entrance Stairs-Rails: None Entrance Stairs-Number of Steps: 0 Home Layout: One level Home Equipment: Walker - 2 wheels;Cane - single point;Bedside commode;Shower seat Additional Comments: Patient recently at facility for STR seevral weeks ago, but wants to return to home at this time.     Prior Function Level of Independence: Needs assistance   Gait / Transfers Assistance Needed: RW for household distance AMB.   ADL's / Homemaking Assistance Needed: assisted to bath and able to sit to do  UB and spouse does grocery shopping, meals, and transportation.   Comments: Sup for basic transfers, household and very limited community mobility with [employer]; assist from husband for ADLs as needed. Home O2 at 2L     Hand Dominance   Dominant Hand: Right    Extremity/Trunk Assessment   Upper Extremity Assessment Upper Extremity Assessment: Generalized weakness    Lower Extremity  Assessment Lower Extremity Assessment: Generalized weakness       Communication   Communication: HOH  Cognition Arousal/Alertness: Awake/alert Behavior During Therapy: WFL for tasks assessed/performed Overall Cognitive Status: Within Functional Limits for tasks assessed                                        General Comments      Exercises     Assessment/Plan    PT Assessment Patient needs continued PT services  PT Problem List Decreased strength;Decreased activity tolerance;Decreased balance;Decreased mobility;Cardiopulmonary status limiting activity;Decreased safety awareness;Decreased knowledge of precautions;Decreased knowledge of use of DME;Obesity       PT Treatment Interventions DME instruction;Gait training;Functional mobility training;Therapeutic activities;Balance training;Patient/family education;Therapeutic exercise    PT Goals (Current goals can be found in the Care Plan section)  Acute Rehab PT Goals Patient Stated Goal: return to home and continue to get stronger  PT Goal Formulation: With patient Time For Goal Achievement: 11/25/17 Potential to Achieve Goals: Good    Frequency Min 2X/week   Barriers to discharge        Co-evaluation               AM-PAC PT "6 Clicks" Daily Activity  Outcome Measure Difficulty turning over in bed (including adjusting bedclothes, sheets and blankets)?: A Lot Difficulty moving from lying on back to sitting on the side of the bed? : A Lot Difficulty sitting down on and standing up from a chair with arms (e.g., wheelchair, bedside commode, etc,.)?: Unable Help needed moving to and from a bed to chair (including a wheelchair)?: A Lot Help needed walking in hospital room?: A Lot Help needed climbing 3-5 steps with a railing? : Total 6 Click Score: 10    End of Session Equipment Utilized During Treatment: Oxygen Activity Tolerance: Patient tolerated treatment well;Patient limited by  fatigue;Treatment limited secondary to medical complications (Comment)(quick desat) Patient left: in chair;with call bell/phone within reach Nurse Communication: Mobility status PT Visit Diagnosis: Unsteadiness on feet (R26.81);Difficulty in walking, not elsewhere classified (R26.2);Muscle weakness (generalized) (M62.81)    Time: 7564-3329 PT Time Calculation (min) (ACUTE ONLY): 19 min   Charges:   PT Evaluation $PT Eval Moderate Complexity: 1 Mod          9:38 AM, 11/11/17 Etta Grandchild, PT, DPT Physical Therapist - [facility]  [phone] ([place])    Katherine Katherine [name] 11/11/2017, 9:36 AM

## 2017-11-11 NOTE — Progress Notes (Signed)
[place] [facility] [place] NAME: Katherine [name]    MR#:  [id number]  DATE OF BIRTH:  06/05/1940  SUBJECTIVE:  CHIEF COMPLAINT:   Chief Complaint  Patient presents with  . Shortness of Breath  Patient without complaint, hungry to eat, symptom complex more consistent with COPD exacerbation  REVIEW OF SYSTEMS:  CONSTITUTIONAL: No fever, fatigue or weakness.  EYES: No blurred or double vision.  EARS, NOSE, AND THROAT: No tinnitus or ear pain.  RESPIRATORY: No cough, shortness of breath, wheezing or hemoptysis.  CARDIOVASCULAR: No chest pain, orthopnea, edema.  GASTROINTESTINAL: No nausea, vomiting, diarrhea or abdominal pain.  GENITOURINARY: No dysuria, hematuria.  ENDOCRINE: No polyuria, nocturia,  HEMATOLOGY: No anemia, easy bruising or bleeding SKIN: No rash or lesion. MUSCULOSKELETAL: No joint pain or arthritis.   NEUROLOGIC: No tingling, numbness, weakness.  PSYCHIATRY: No anxiety or depression.   ROS  DRUG ALLERGIES:   Allergies  Allergen Reactions  . Bee Venom Swelling  . Enalapril Maleate Swelling    VITALS:  Blood pressure 119/82, pulse 85, temperature 97.9 F (36.6 C), temperature source Oral, resp. rate 18, height 5\' 7"  (1.702 m), weight 121.1 kg, SpO2 (!) 86 %.  PHYSICAL EXAMINATION:  GENERAL:  77 y.o.-year-old patient lying in the bed with no acute distress.  EYES: Pupils equal, round, reactive to light and accommodation. No scleral icterus. Extraocular muscles intact.  HEENT: Head atraumatic, normocephalic. Oropharynx and nasopharynx clear.  NECK:  Supple, no jugular venous distention. No thyroid enlargement, no tenderness.  LUNGS: Normal breath sounds bilaterally, no wheezing, rales,rhonchi or crepitation. No use of accessory muscles of respiration.  CARDIOVASCULAR: S1, S2 normal. No murmurs, rubs, or gallops.  ABDOMEN: Soft, nontender, nondistended. Bowel sounds present. No organomegaly or mass.  EXTREMITIES: No pedal  edema, cyanosis, or clubbing.  NEUROLOGIC: Cranial nerves II through XII are intact. Muscle strength 5/5 in all extremities. Sensation intact. Gait not checked.  PSYCHIATRIC: The patient is alert and oriented x 3.  SKIN: No obvious rash, lesion, or ulcer.   Physical Exam LABORATORY PANEL:   CBC Recent Labs  Lab 11/11/17 0606  WBC 5.5  HGB 9.7*  HCT 29.5*  PLT 213   ------------------------------------------------------------------------------------------------------------------  Chemistries  Recent Labs  Lab 11/10/17 1138  11/11/17 0606  NA 146*  --  143  K 4.5  --  4.6  CL 107  --  105  CO2 30  --  29  GLUCOSE 137*  --  184*  BUN 19  --  24*  CREATININE 1.73*   < > 1.68*  CALCIUM 8.7*  --  8.8*  AST 20  --   --   ALT 19  --   --   ALKPHOS 60  --   --   BILITOT 0.6  --   --    < > = values in this interval not displayed.   ------------------------------------------------------------------------------------------------------------------  Cardiac Enzymes Recent Labs  Lab 11/10/17 1138  TROPONINI <0.03   ------------------------------------------------------------------------------------------------------------------  RADIOLOGY:  Dg Chest 2 View  Result Date: 11/10/2017 CLINICAL DATA:  Shortness of breath EXAM: CHEST - 2 VIEW COMPARISON:  11/10/2017, 10/28/2017, 10/26/2017 FINDINGS: No pleural effusion. Scarring or atelectasis at the left base. Mild cardiomegaly with tortuous calcified aorta. No pneumothorax. IMPRESSION: 1. Scarring or atelectasis at the left base 2. Stable mild cardiomegaly Electronically Signed   By: Donavan Foil M.D.   On: 11/10/2017 20:24   Ct Head Wo Contrast  Result Date: 11/10/2017 CLINICAL DATA:  Dysarthria EXAM: CT HEAD WITHOUT CONTRAST TECHNIQUE: Contiguous axial images were obtained from the base of the skull through the vertex without intravenous contrast. COMPARISON:  October 26, 2017 FINDINGS: Brain: Mild diffuse atrophy is stable.  There is no intracranial mass, hemorrhage, extra-axial fluid collection, or midline shift. There is mild patchy small vessel disease in the centra semiovale bilaterally, slightly more on the right than on the left, stable. No new brain parenchymal lesions evident. No acute infarct evident. Vascular: No hyperdense vessel. There is calcification in each carotid siphon region. Skull: The bony calvarium appears intact. Sinuses/Orbits: There is an air-fluid level in the right sphenoid sinus, also present on CT from 2 weeks prior. There is mucosal thickening in several ethmoid air cells which appears stable. Visualized orbits appear symmetric bilaterally. Other: Extensive mastoid air cell disease persists without change. IMPRESSION: Atrophy with mild patchy periventricular small vessel disease, stable. No acute infarct evident. No mass or hemorrhage. There are foci of arterial vascular calcification. Extensive mastoid air cell disease persists. Persistent paranasal sinus disease noted with air-fluid level in the right sphenoid sinus, stable. Electronically Signed   By: Lowella Grip III M.D.   On: 11/10/2017 12:25   Dg Chest Port 1 View  Result Date: 11/10/2017 CLINICAL DATA:  Shortness of breath EXAM: PORTABLE CHEST 1 VIEW COMPARISON:  October 28, 2017 FINDINGS: There is atelectatic change in the bases. Lungs elsewhere clear. Heart is mildly enlarged with pulmonary vascularity normal. No adenopathy. There is aortic atherosclerosis. No evident bone lesions. IMPRESSION: Bibasilar atelectasis. No edema or consolidation. Stable cardiac prominence. There is aortic atherosclerosis. Aortic Atherosclerosis (ICD10-I70.0). Electronically Signed   By: Lowella Grip III M.D.   On: 11/10/2017 12:22    ASSESSMENT AND PLAN:  *Acute on chronic hypoxic respiratory failure Resolving Most likely secondary to COPD exacerbation Wean O2 as tolerated-on 2 L via nasal cannula chronically at home  *Acute on COPD  exacerbation Discontinue IV Solu-Medrol, start prednisone taper, aggressive pulmonary toilet bronchodilator therapy, inhaled corticosteroids twice daily, mucolytic agents, O2 with weaning per above, procalcitonin level negative  *Acute ? Angioedema Resolved Has a history of this in the past secondary to enalapril, although not on ACE/ARB currently. 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 ECHO 09/2017 without CHF TED hose, Lasix, leg elevation  *Chronic benign essential hypertension Stable on current regiment  *History of coronary artery disease Stable continue plavix  *Chronic diabetes mellitus type 2  Last globin A1c 7.7 Controlled on current regiment  *CKD III At baseline Continue to avoid nephrotoxic agents   *Chronic normocytic anemia  At baseline   *History of seizures Stable Continue Keppra  *Chronic anxiety/depression suicidal ideation Stable on home psychotropic regiment  Disposition home tomorrow barring any complications  All the records are reviewed and case discussed with Care Management/Social Workerr. Management plans discussed with the patient, family and they are in agreement.  CODE STATUS: full  TOTAL TIME TAKING CARE OF THIS PATIENT: 35 minutes.     POSSIBLE D/C IN 1-2 DAYS, DEPENDING ON CLINICAL CONDITION.   Avel Peace Salary M.D on 11/11/2017   Between 7am to 6pm - Pager - [phone]  After 6pm go to [email or web address] - password [email or web address] [facility]  Office  [phone]  CC: Primary care physician; Valerie Roys, DO  Note: This dictation was prepared with Dragon dictation along with smaller phrase technology. Any transcriptional errors that result from this process are  unintentional.

## 2017-11-11 NOTE — Progress Notes (Signed)
Family Meeting Note  Advance Directive:yes  Today a meeting took place with the Patient.  Patient is able to participate   The following clinical team members were present during this meeting:MD  The following were discussed:Patient's diagnosis: copd, Patient's progosis: Unable to determine and Goals for treatment: Full Code  Additional follow-up to be provided: prn  Time spent during discussion:20 minutes  Montell D Salary, MD  

## 2017-11-12 LAB — GLUCOSE, CAPILLARY
GLUCOSE-CAPILLARY: 240 mg/dL — AB (ref 70–99)
GLUCOSE-CAPILLARY: 204 mg/dL — AB (ref 70–99)
GLUCOSE-CAPILLARY: 191 mg/dL — AB (ref 70–99)
Glucose-Capillary: 193 mg/dL — ABNORMAL HIGH (ref 70–99)

## 2017-11-12 MED ORDER — GUAIFENESIN ER 600 MG PO TB12
600.0000 mg | ORAL_TABLET | Freq: Two times a day (BID) | ORAL | Status: DC
  Administered 2017-11-12 – 2017-11-13 (×3): 600 mg via ORAL
  Filled 2017-11-12 (×3): qty 1

## 2017-11-12 MED ORDER — FUROSEMIDE 20 MG PO TABS
20.0000 mg | ORAL_TABLET | Freq: Every day | ORAL | Status: DC
  Administered 2017-11-12 – 2017-11-13 (×2): 20 mg via ORAL
  Filled 2017-11-12 (×2): qty 1

## 2017-11-12 MED ORDER — METOLAZONE 5 MG PO TABS
5.0000 mg | ORAL_TABLET | Freq: Once | ORAL | Status: AC
  Administered 2017-11-12: 5 mg via ORAL
  Filled 2017-11-12: qty 1

## 2017-11-12 MED ORDER — BUDESONIDE 0.5 MG/2ML IN SUSP
0.5000 mg | Freq: Two times a day (BID) | RESPIRATORY_TRACT | Status: DC
  Administered 2017-11-13: 0.5 mg via RESPIRATORY_TRACT
  Filled 2017-11-12 (×2): qty 2

## 2017-11-12 NOTE — Progress Notes (Signed)
[place] at [place] NAME: Katherine [name]    MR#:  [id number]  DATE OF BIRTH:  10-14-1940  SUBJECTIVE:  CHIEF COMPLAINT:   Chief Complaint  Patient presents with  . Shortness of Breath  Patient without complaint, continues to require increased amount of O2 via nasal cannula-on 4 L currently, on 2 L at home, wean O2 off as tolerated, TED hose to bilateral lower extremities, increase activity/ambulate daily  REVIEW OF SYSTEMS:  CONSTITUTIONAL: No fever, fatigue or weakness.  EYES: No blurred or double vision.  EARS, NOSE, AND THROAT: No tinnitus or ear pain.  RESPIRATORY: No cough, shortness of breath, wheezing or hemoptysis.  CARDIOVASCULAR: No chest pain, orthopnea, edema.  GASTROINTESTINAL: No nausea, vomiting, diarrhea or abdominal pain.  GENITOURINARY: No dysuria, hematuria.  ENDOCRINE: No polyuria, nocturia,  HEMATOLOGY: No anemia, easy bruising or bleeding SKIN: No rash or lesion. MUSCULOSKELETAL: No joint pain or arthritis.   NEUROLOGIC: No tingling, numbness, weakness.  PSYCHIATRY: No anxiety or depression.   ROS  DRUG ALLERGIES:   Allergies  Allergen Reactions  . Bee Venom Swelling  . Enalapril Maleate Swelling    VITALS:  Blood pressure (!) 141/85, pulse 86, temperature 98.2 F (36.8 C), temperature source Oral, resp. rate 18, height 5\' 7"  (1.702 m), weight 121.1 kg, SpO2 94 %.  PHYSICAL EXAMINATION:  GENERAL:  77 y.o.-year-old patient lying in the bed with no acute distress.  EYES: Pupils equal, round, reactive to light and accommodation. No scleral icterus. Extraocular muscles intact.  HEENT: Head atraumatic, normocephalic. Oropharynx and nasopharynx clear.  NECK:  Supple, no jugular venous distention. No thyroid enlargement, no tenderness.  LUNGS: Normal breath sounds bilaterally, no wheezing, rales,rhonchi or crepitation. No use of accessory muscles of respiration.  CARDIOVASCULAR: S1, S2 normal. No murmurs, rubs,  or gallops.  ABDOMEN: Soft, nontender, nondistended. Bowel sounds present. No organomegaly or mass.  EXTREMITIES: No pedal edema, cyanosis, or clubbing.  NEUROLOGIC: Cranial nerves II through XII are intact. Muscle strength 5/5 in all extremities. Sensation intact. Gait not checked.  PSYCHIATRIC: The patient is alert and oriented x 3.  SKIN: No obvious rash, lesion, or ulcer.   Physical Exam LABORATORY PANEL:   CBC Recent Labs  Lab 11/11/17 0606  WBC 5.5  HGB 9.7*  HCT 29.5*  PLT 213   ------------------------------------------------------------------------------------------------------------------  Chemistries  Recent Labs  Lab 11/10/17 1138  11/11/17 0606  NA 146*  --  143  K 4.5  --  4.6  CL 107  --  105  CO2 30  --  29  GLUCOSE 137*  --  184*  BUN 19  --  24*  CREATININE 1.73*   < > 1.68*  CALCIUM 8.7*  --  8.8*  AST 20  --   --   ALT 19  --   --   ALKPHOS 60  --   --   BILITOT 0.6  --   --    < > = values in this interval not displayed.   ------------------------------------------------------------------------------------------------------------------  Cardiac Enzymes Recent Labs  Lab 11/10/17 1138  TROPONINI <0.03   ------------------------------------------------------------------------------------------------------------------  RADIOLOGY:  Dg Chest 2 View  Result Date: 11/10/2017 CLINICAL DATA:  Shortness of breath EXAM: CHEST - 2 VIEW COMPARISON:  11/10/2017, 10/28/2017, 10/26/2017 FINDINGS: No pleural effusion. Scarring or atelectasis at the left base. Mild cardiomegaly with tortuous calcified aorta. No pneumothorax. IMPRESSION: 1. Scarring or atelectasis at the left base 2. Stable mild cardiomegaly Electronically Signed  By: Donavan Foil M.D.   On: 11/10/2017 20:24    ASSESSMENT AND PLAN:  *Acute on chronic hypoxic respiratory failure Resolving Most likely secondary to COPD exacerbation Wean O2 as tolerated-on 2 L via nasal cannula chronically  at home-currently on 4 L  *Acute on COPD exacerbation Resolving  Continue prednisone taper,  BTs prn, inhaled corticosteroids twice daily, mucolytic agents, O2 with weaning per above, procalcitonin level negative  *Acute ? Angioedema Resolved Has a history of this in the past secondary to enalapril, although not on ACE/ARB currently. 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 ECHO 09/2017 without CHF TED hose, Lasix, leg elevation  *Chronic benign essential hypertension Stable on current regiment  *History of coronary artery disease Stable continue plavix  *Chronic diabetes mellitus type 2  Last globin A1c 7.7 Controlled on current regiment  *CKD III At baseline Continue to avoid nephrotoxic agents   *Chronic normocytic anemia  At baseline   *History of seizures Stable Continue Keppra  *Chronic anxiety/depression suicidal ideation Stable on home psychotropic regiment  Disposition home tomorrow barring any complications  All the records are reviewed and case discussed with Care Management/Social Workerr. Management plans discussed with the patient, family and they are in agreement.  CODE STATUS: full  TOTAL TIME TAKING CARE OF THIS PATIENT: 35 minutes.     POSSIBLE D/C IN 1-2 DAYS, DEPENDING ON CLINICAL CONDITION.   Avel Peace Salary M.D on 11/12/2017   Between 7am to 6pm - Pager - [phone]  After 6pm go to [email or web address] - password [email or web address] [facility]  Office  [phone]  CC: Primary care physician; Valerie Roys, DO  Note: This dictation was prepared with Dragon dictation along with smaller phrase technology. Any transcriptional errors that result from this process are  unintentional.

## 2017-11-12 NOTE — Care Management Note (Signed)
Case Management Note  Patient Details  Name: Katherine [name] MRN: [id number] Date of Birth: 19-Apr-1940  Subjective/Objective:        Patient admitted with acute on chronic respiratory failure.  Lives at home with husband.  She is on O2 at home.  She is open to [facility] home health for RN, PT, OT and SW.  Denies difficulty obtaining medications.  No transportation issues.  Husband drives her to her appointments.  Current with PCP.   No other needs identified at this time.     Action/Plan: Will notify [facility] when patient is discharging to home.          Expected Discharge Date:                  Expected Discharge Plan:  [place]  In-House Referral:     Discharge planning Services  CM Consult  Post Acute Care Choice:  Resumption of Svcs/PTA Provider Choice offered to:     DME Arranged:    DME Agency:     HH Arranged:  RN, PT, OT, Social Work [employer] Agency:  [facility]  Status of Service:  In process, will continue to follow  If discussed at Long Length of Stay Meetings, dates discussed:    Additional Comments:  Elza Rafter, RN 11/12/2017, 3:42 PM

## 2017-11-13 LAB — GLUCOSE, CAPILLARY
GLUCOSE-CAPILLARY: 216 mg/dL — AB (ref 70–99)
Glucose-Capillary: 164 mg/dL — ABNORMAL HIGH (ref 70–99)

## 2017-11-13 MED ORDER — FUROSEMIDE 20 MG PO TABS: 20.0000 mg | ORAL_TABLET | Freq: Every day | ORAL | 0 refills | Status: DC

## 2017-11-13 MED ORDER — IPRATROPIUM-ALBUTEROL 0.5-2.5 (3) MG/3ML IN SOLN
3.0000 mL | Freq: Three times a day (TID) | RESPIRATORY_TRACT | Status: DC
  Filled 2017-11-13: qty 3

## 2017-11-13 MED ORDER — GUAIFENESIN ER 600 MG PO TB12: 600.0000 mg | ORAL_TABLET | Freq: Two times a day (BID) | ORAL | 0 refills | Status: DC

## 2017-11-13 MED ORDER — PREDNISONE 10 MG (21) PO TBPK: ORAL_TABLET | ORAL | 0 refills | Status: DC

## 2017-11-13 NOTE — Progress Notes (Signed)
Patient given discharge instructions with husband at bedside. IV taken out and tele monitor off. Prescriptions given, patient and family verbalized understanding with no further questions or concerns. Patient going home, case management has everything set up already. Patient going home in family vehicle.

## 2017-11-13 NOTE — Plan of Care (Signed)
  Problem: Education: Goal: Knowledge of General Education information will improve Description Including pain rating scale, medication(s)/side effects and non-pharmacologic comfort measures Outcome: Adequate for Discharge   Problem: Health Behavior/Discharge Planning: Goal: Ability to manage health-related needs will improve Outcome: Adequate for Discharge   Problem: Clinical Measurements: Goal: Ability to maintain clinical measurements within normal limits will improve Outcome: Adequate for Discharge Goal: Will remain free from infection Outcome: Adequate for Discharge Goal: Diagnostic test results will improve Outcome: Adequate for Discharge Goal: Respiratory complications will improve Outcome: Adequate for Discharge Goal: Cardiovascular complication will be avoided Outcome: Adequate for Discharge   Problem: Activity: Goal: Risk for activity intolerance will decrease Outcome: Adequate for Discharge   Problem: Nutrition: Goal: Adequate nutrition will be maintained Outcome: Adequate for Discharge   Problem: Coping: Goal: Level of anxiety will decrease Outcome: Adequate for Discharge   Problem: Elimination: Goal: Will not experience complications related to bowel motility Outcome: Adequate for Discharge Goal: Will not experience complications related to urinary retention Outcome: Adequate for Discharge   Problem: Pain Managment: Goal: General experience of comfort will improve Outcome: Adequate for Discharge   Problem: Safety: Goal: Ability to remain free from injury will improve Outcome: Adequate for Discharge   Problem: Skin Integrity: Goal: Risk for impaired skin integrity will decrease Outcome: Adequate for Discharge   Problem: Education: Goal: Knowledge of disease or condition will improve Outcome: Adequate for Discharge Goal: Knowledge of the prescribed therapeutic regimen will improve Outcome: Adequate for Discharge Goal: Individualized Educational  Video(s) Outcome: Adequate for Discharge   Problem: Respiratory: Goal: Ability to maintain a clear airway will improve Outcome: Adequate for Discharge Goal: Levels of oxygenation will improve Outcome: Adequate for Discharge Goal: Ability to maintain adequate ventilation will improve Outcome: Adequate for Discharge

## 2017-11-13 NOTE — Discharge Summary (Signed)
[facility] NAME: Katherine [name]    MR#:  [id number]  DATE OF BIRTH:  12/03/1940  DATE OF ADMISSION:  11/10/2017 ADMITTING PHYSICIAN: Sela Hua, MD  DATE OF DISCHARGE: No discharge date for patient encounter.  PRIMARY CARE PHYSICIAN: Park Liter P, DO    ADMISSION DIAGNOSIS:  Angioedema, initial encounter [T78.3XXA] Altered mental status, unspecified altered mental status type [R41.82]  DISCHARGE DIAGNOSIS:  Active Problems:   Acute on chronic respiratory failure with hypoxia (HCC)   SECONDARY DIAGNOSIS:   Past Medical History:  Diagnosis Date  . Angioedema   . Anxiety   . Anxiety and depression   . Chronic constipation   . Chronic kidney disease    stage 3  . COPD (chronic obstructive pulmonary disease) ([place])   . Gallstones   . GERD (gastroesophageal reflux disease)   . Hypertension   . Left ventricular hypertrophy   . Osteoarthritis   . Prurigo nodularis   . Tobacco abuse   . Vitamin D deficiency disease     HOSPITAL COURSE:  *Acute on chronic hypoxic respiratory failure Resolved Most likely secondary to COPD exacerbation Successfully wean back to O2 requirement of 2-3 L via nasal cannula   *Acute on COPD exacerbation Resolved  Continue prednisone taper,  BTs prn, inhaled corticosteroids twice daily, mucolytic agents, supplemental oxygen per above, procalcitonin level negative, will arrange for follow-up with pulmonology status post discharge in 1 to 2 weeks for reevaluation  *Acute ? Angioedema Resolved Has a history of this in the past secondary to enalapril, although not on ACE/ARB currently. 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 Bee Venom Swelling  . Enalapril Maleate Swelling    DISCHARGE MEDICATIONS:   Allergies as of 11/13/2017      Reactions   Bee Venom Swelling   Enalapril Maleate Swelling      Medication List    TAKE these medications   acetaminophen 650 MG CR tablet Commonly known as:  TYLENOL Take 650-1,300 mg by mouth every 8 (eight) hours as needed for pain.   albuterol 108 (90 Base) MCG/ACT inhaler Commonly known as:  PROVENTIL HFA;VENTOLIN HFA Inhale 2 puffs into the lungs every 4 (four) hours as needed for wheezing or shortness of breath.   albuterol 0.63 MG/3ML nebulizer solution Commonly known as:  ACCUNEB Take 3 mLs by nebulization 3 (three) times daily as needed for wheezing.   ALPRAZolam 0.5 MG tablet Commonly known as:  XANAX Take 0.5 mg by mouth 2 (two) times daily as needed for anxiety.   atorvastatin 80 MG tablet Commonly known as:  LIPITOR Take 80 mg by mouth daily.   cetirizine 10 MG tablet Commonly known as:  ZYRTEC Take  5 mg by mouth at bedtime.   cholecalciferol 1000 units tablet Commonly known as:  VITAMIN D Take 1,000 Units by mouth daily.   clopidogrel 75 MG tablet Commonly known as:  PLAVIX Take 75 mg by mouth daily.   diltiazem 120 MG 12 hr capsule Commonly known as:  CARDIZEM SR Take 120 mg by mouth 2 (two) times daily.   docusate sodium 100  MG capsule Commonly known as:  COLACE Take 100 mg by mouth daily.   FLUoxetine 10 MG capsule Commonly known as:  PROZAC Take 10 mg by mouth daily.   Fluticasone-Umeclidin-Vilant 100-62.5-25 MCG/INH Aepb Inhale 1 puff into the lungs daily.   furosemide 20 MG tablet Commonly known as:  LASIX Take 1 tablet (20 mg total) by mouth daily. Start taking on:  11/14/2017   gabapentin 300 MG capsule Commonly known as:  NEURONTIN Take 300 mg by mouth at bedtime.   guaiFENesin 600 MG 12 hr tablet Commonly known as:  MUCINEX Take 1 tablet (600 mg total) by mouth 2 (two) times daily.   hydrALAZINE 25 MG tablet Commonly known as:  APRESOLINE Take 25 mg by mouth 3 (three) times daily.   ipratropium 0.03 % nasal spray Commonly known as:  ATROVENT Place 2 sprays into both nostrils every 12 (twelve) hours.   ipratropium-albuterol 0.5-2.5 (3) MG/3ML Soln Commonly known as:  DUONEB Take 3 mLs by nebulization every 6 (six) hours as needed (shortness of breath).   levETIRAcetam 500 MG 24 hr tablet Commonly known as:  KEPPRA XR Take 2 tablets (1,000 mg total) by mouth daily.   linagliptin 5 MG Tabs tablet Commonly known as:  TRADJENTA Take 1 tablet (5 mg total) by mouth daily.   meclizine 25 MG tablet Commonly known as:  ANTIVERT Take 25 mg by mouth 2 (two) times daily as needed for dizziness.   methocarbamol 500 MG tablet Commonly known as:  ROBAXIN Take 500 mg by mouth every 8 (eight) hours as needed for muscle spasms.   pantoprazole 40 MG tablet Commonly known as:  PROTONIX Take 40 mg by mouth daily.   predniSONE 10 MG (21) Tbpk tablet Commonly known as:  STERAPRED UNI-PAK 21 TAB As directed   tiotropium 18 MCG inhalation capsule Commonly known as:  SPIRIVA Place 18 mcg into inhaler and inhale daily.        DISCHARGE INSTRUCTIONS:   If you experience worsening of your admission symptoms, develop shortness of breath, life threatening emergency, suicidal or homicidal  thoughts you must seek medical attention immediately by calling 911 or calling your MD immediately  if symptoms less severe.  You Must read complete instructions/literature along with all the possible adverse reactions/side effects for all the Medicines you take and that have been prescribed to you. Take any new Medicines after you have completely understood and accept all the possible adverse reactions/side effects.   Please note  You were cared for by a hospitalist during your hospital stay. If you have any questions about your discharge medications or the care you received while you were in the hospital after you are discharged, you can call the unit and asked to speak with the hospitalist on call if the hospitalist that took care of you is not available. Once you are discharged, your primary care physician will handle any further medical issues. Please note that NO REFILLS for any discharge medications will be authorized once you are discharged, as it is imperative that you return to your primary care physician (or establish  a relationship with a primary care physician if you do not have one) for your aftercare needs so that they can reassess your need for medications and monitor your lab values.    Today   CHIEF COMPLAINT:   Chief Complaint  Patient presents with  . Shortness of Breath    HISTORY OF PRESENT ILLNESS:  77 y.o. female with a known history of HTN, COPD, CKD III, anxiety/depression, and angioedema who presented to the ED with altered mental status that started this morning. She was also noted to have some difficulty talking due to swelling of her tongue and lips. Patient does not know how long this has been going on. She has not taken any new medications. She endorses shortness of breath and lower extremity edema for the last couple of days. She uses 2L of O2 at home. She endorses cough productive of yellow sputum. She denies any fevers or chills.  In the ED, she was initially  placed on 6L O2 by [facility] and then weaned to 4L. WBC normal. CXR negative. CT head was negative for intracranial abnormality, but did show extensive mastoid air cell disease and persistent paranasal sinus disease with stable air-fluid level in the right sphenoid sinus. She was given solumedrol and benadryl for possible angioedema. Hospitalist team was called for admission. VITAL SIGNS:  Blood pressure 128/76, pulse 81, temperature 98.1 F (36.7 C), temperature source Oral, resp. rate 18, height 5\' 7"  (1.702 m), weight 118.6 kg, SpO2 94 %.  I/O:    Intake/Output Summary (Last 24 hours) at 11/13/2017 1056 Last data filed at 11/13/2017 0925 Gross per 24 hour  Intake 3 ml  Output 2500 ml  Net -2497 ml    PHYSICAL EXAMINATION:  GENERAL:  77 y.o.-year-old patient lying in the bed with no acute distress.  EYES: Pupils equal, round, reactive to light and accommodation. No scleral icterus. Extraocular muscles intact.  HEENT: Head atraumatic, normocephalic. Oropharynx and nasopharynx clear.  NECK:  Supple, no jugular venous distention. No thyroid enlargement, no tenderness.  LUNGS: Normal breath sounds bilaterally, no wheezing, rales,rhonchi or crepitation. No use of accessory muscles of respiration.  CARDIOVASCULAR: S1, S2 normal. No murmurs, rubs, or gallops.  ABDOMEN: Soft, non-tender, non-distended. Bowel sounds present. No organomegaly or mass.  EXTREMITIES: No pedal edema, cyanosis, or clubbing.  NEUROLOGIC: Cranial nerves II through XII are intact. Muscle strength 5/5 in all extremities. Sensation intact. Gait not checked.  PSYCHIATRIC: The patient is alert and oriented x 3.  SKIN: No obvious rash, lesion, or ulcer.   DATA REVIEW:   CBC Recent Labs  Lab 11/11/17 0606  WBC 5.5  HGB 9.7*  HCT 29.5*  PLT 213    Chemistries  Recent Labs  Lab 11/10/17 1138  11/11/17 0606  NA 146*  --  143  K 4.5  --  4.6  CL 107  --  105  CO2 30  --  29  GLUCOSE 137*  --  184*  BUN 19  --  24*   CREATININE 1.73*   < > 1.68*  CALCIUM 8.7*  --  8.8*  AST 20  --   --   ALT 19  --   --   ALKPHOS 60  --   --   BILITOT 0.6  --   --    < > = values in this interval not displayed.    Cardiac Enzymes Recent Labs  Lab 11/10/17 1138  TROPONINI <0.03    Microbiology Results  Results for orders  placed or performed during the hospital encounter of 10/23/17  Blood Culture (routine x 2)     Status: None   Collection Time: 10/23/17 11:13 PM  Result Value Ref Range Status   Specimen Description BLOOD RIGHT ASSIST CONTROL  Final   Special Requests   Final    BOTTLES DRAWN AEROBIC AND ANAEROBIC Blood Culture results may not be optimal due to an excessive volume of blood received in culture bottles   Culture   Final    NO GROWTH 5 DAYS Performed at [facility], [address] [place], [place] [address]    Report Status 10/28/2017 FINAL  Final  Blood Culture (routine x 2)     Status: None   Collection Time: 10/24/17 12:08 AM  Result Value Ref Range Status   Specimen Description BLOOD BLOOD RIGHT WRIST  Final   Special Requests   Final    BOTTLES DRAWN AEROBIC AND ANAEROBIC Blood Culture results may not be optimal due to an excessive volume of blood received in culture bottles   Culture   Final    NO GROWTH 5 DAYS Performed at [facility], [address] [place], [place] [address]    Report Status 10/29/2017 FINAL  Final  MRSA PCR Screening     Status: None   Collection Time: 10/26/17  6:54 PM  Result Value Ref Range Status   MRSA by PCR NEGATIVE NEGATIVE Final    Comment:        The GeneXpert MRSA Assay (FDA approved for NASAL specimens only), is one component of a comprehensive MRSA colonization surveillance program. It is not intended to diagnose MRSA infection nor to guide or monitor treatment for MRSA infections. Performed at [facility], [address] [place], [place] [address]     RADIOLOGY:  No results found.  EKG:    Orders placed or performed during the hospital encounter of 10/23/17  . ED EKG 12-Lead  . ED EKG 12-Lead  . EKG 12-Lead  . EKG 12-Lead  . EKG      Management plans discussed with the patient, family and they are in agreement.  CODE STATUS:     Code Status Orders  (From admission, onward)         Start     Ordered   11/10/17 1841  Full code  Continuous     11/10/17 1840        Code Status History    Date Active Date Inactive Code Status Order ID Comments User Context   10/24/2017 0247 10/29/2017 2301 Full Code [id number]  Lance Coon, MD Inpatient   10/04/2017 1005 10/10/2017 1813 Full Code [id number]  Estill Bakes Inpatient   10/01/2017 4696 10/04/2017 0904 Full Code [id number]  Lolita Cram, RN Inpatient   09/19/2017 1306 09/22/2017 1622 Full Code [id number]  Gorden Harms, MD Inpatient   09/11/2017 2021 09/15/2017 1510 Full Code [id number]  Gorden Harms, MD Inpatient   08/27/2017 1543 08/29/2017 2159 Full Code [id number]  Hillary Bow, MD ED   06/28/2017 1414 06/30/2017 1748 Full Code [id number]  Kolby Schara, Avel Peace, MD Inpatient   06/12/2017 0858 06/16/2017 1659 DNR [id number]  Demetrios Loll, MD Inpatient   05/26/2017 1732 05/30/2017 1446 DNR [id number]  Hillary Bow, MD ED   05/17/2017 1228 05/21/2017 1511 DNR [id number]  Asencion Gowda, [facility] Inpatient   04/21/2017 0117 04/22/2017 1750 DNR [id number]  Lance Coon, MD Inpatient   04/05/2017 1411 04/08/2017 2101 Full Code [id number]  Pyreddy,  Reatha Harps, MD Inpatient   02/08/2016 1414 02/11/2016 1729 Full Code [id number]  Hessie Knows, MD Inpatient   11/27/2015 1645 11/29/2015 1628 Full Code [id number]  Idelle Crouch, MD Inpatient   02/10/2015 2122 02/12/2015 1809 Full Code [id number]  Lytle Butte, MD ED   09/16/2014 1220 09/19/2014 1736 Full Code [id number]  Aldean Jewett, MD Inpatient      TOTAL TIME TAKING CARE OF THIS PATIENT: 45 minutes.    Avel Peace Ricky Gallery M.D on 11/13/2017 at 10:56 AM  Between 7am to 6pm - Pager -  [phone]  After 6pm go to [email or web address] - password [email or web address] [facility]  Office  [phone]  CC: Primary care physician; Valerie Roys, DO   Note: This dictation was prepared with Dragon dictation along with smaller phrase technology. Any transcriptional errors that result from this process are unintentional.

## 2017-11-13 NOTE — Care Management Note (Signed)
Case Management Note  Patient Details  Name: Katherine [name] MRN: [id number] Date of Birth: 1940/07/28  Subjective/Objective:      Per Dr. Jerelyn Charles patient discharging today.  Open to [facility] but had not been seen yet after SNF discharge to home and readmitted.  Patient has had 11 admissions. [facility] RN, PT, OT, SW and COPD protocol.                 Action/Plan: [facility] notified of discharge today.  Added COPD protocol to home health.    Expected Discharge Date:                  Expected Discharge Plan:  [place]  In-House Referral:     Discharge planning Services  CM Consult  Post Acute Care Choice:  Resumption of Svcs/PTA Provider Choice offered to:     DME Arranged:    DME Agency:     HH Arranged:  RN, PT, OT, Social Work(COPD protocol) [place] Agency:  [facility]  Status of Service:  Completed, signed off  If discussed at [place] of Stay Meetings, dates discussed:    Additional Comments:  Elza Rafter, RN 11/13/2017, 9:53 AM

## 2017-11-13 NOTE — Progress Notes (Signed)
IS education complete. Pt understands technique and reason for use. Pt inspire approximately 829ml. Pt need encouragement to use

## 2017-11-13 NOTE — Care Management Important Message (Signed)
Copy of signed IM left with patient in room.  

## 2017-11-14 ENCOUNTER — Other Ambulatory Visit: Payer: Self-pay | Admitting: *Deleted

## 2017-11-14 ENCOUNTER — Telehealth: Payer: Self-pay

## 2017-11-14 ENCOUNTER — Ambulatory Visit: Payer: Self-pay | Admitting: *Deleted

## 2017-11-14 ENCOUNTER — Other Ambulatory Visit: Payer: Self-pay

## 2017-11-14 NOTE — Telephone Encounter (Signed)
Transition Care Management Follow-up Telephone Call  Spoke with Katherine [name]   Date of discharge and from where: 11/13/2017 from [facility]   How have you been since you were released from the hospital? "she is feeling much better this morning"  Any questions or concerns? No   Items Reviewed:  Did the pt receive and understand the discharge instructions provided? Yes   Medications obtained and verified? Yes   Any new allergies since your discharge? No   Dietary orders reviewed? Yes  Do you have support at home? Yes , talked to home health this morning as well to schedule home visit   Functional Questionnaire: (I = Independent and D = Dependent) ADLs:   Bathing/Dressing- D  Meal Prep- D  Eating- D  Maintaining continence- I, uses walker to get to bathroom   Transferring/Ambulation- D walker   Managing Meds- D  Follow up appointments reviewed:   PCP Hospital f/u appt confirmed? Yes  Scheduled to see Regino Schultze on 11/16/2017 @ 2:00pm.  [facility] f/u appt confirmed? Yes  Scheduled to see Venancio Poisson on 11/19/2017 @ 2:15pm and Dr.Kasa on 11/20/2017 at 3:30pm.  Are transportation arrangements needed? No   If their condition worsens, is the pt aware to call PCP or go to the Emergency Dept.? Yes  Was the patient provided with contact information for the PCP's office or ED? Yes  Was to pt encouraged to call back with questions or concerns? Yes

## 2017-11-14 NOTE — Patient Outreach (Signed)
[name]) Care Management PCP responsible for completing [facility] 11/14/2017  Katherine [name] 03/11/1940 [id number]  Telephone Assessment/Care Coordination: Susscssful initial telephone outreach encounter to Katherine [name], who is a well known established patient of [facility] and known to RN CM. Patient was discharged from [facility] on 9/26 after a hospitalization for COPD exacerbation with secondary stroke like symptoms and was transferred from [facility] to [facility]. Transition of care call attempted day after discharge from [facility] however patient was in the ED post fall. Patient was readmitted day 2 post SNF discharge for altered mental status and angioedema. Patient was discharged 11/13/17. PCP listed as completing transition of care post discharge from hospitalization.   Husband reports patient is doing "good". Patient has difficulty communication via phone related to aphasia.    Medications: --Husband states has all medications and takes as prescribed. --Husband administers medications to patient. --Medication review completed and per husband no discrepancies noted.  Follow-up Appointments: --Appointments reviewed in EMR and reviewed with husband. --Husband acknowledges follow up appointments with PCP 10/4, neurology 10/7, and pulmonology 10/8. Husband denies needs for transportation assistance.  Safety/Mobility/Falls: --RN CM confirmed recent fall via EMR. Recent fall with minimal injury documented the day patient returned home from SNF, 9/26. Patient sought medical care through the ED on 9/27 for right leg swelling. Husband states leg is much better and denies new fall. --Assistive devices used by patient is walker --General fall risks and prevention discussed with husband during this encounter including use of walker with all ambulation.  Social/Community Resources Needed: --Patient currently denies needs for the following community  resources. Husband able to provide transportation to all appointments and to assist patient with ADLs. He is able to maintain a home and provide food. [place] [facility] provided by [facility] will initiate care tomorrow.  Advanced Directives: --Husband states patient does not currently have Advanced Directives in place. Husband states he and patient have the forms to fill out and have notarized, it just needs to be done.  Patient denied additional issues or concerns other than stated above. RN Case Managers contact information was provided verbally along with the main [facility] CM office number and the 24 hour nurse advice line.  Plan: --RN CM will follow-up with patient in one week via  home visit   [facility] CM Care Plan Problem One     Most Recent Value  Care Plan Problem One  high risk for readmission related to chronic health conditions including COPD, DM, recent Stroke and recurrent angioedema  Role Documenting the Problem One  Care Management Coordinator  Care Plan for Problem One  Active  [facility] Long Term Goal   Over the next 31 days, patient will not have rehospitalization as evidenced by patient reporting and review of EMR  [facility] Long Term Goal Start Date  11/14/17  Interventions for Problem One Long Term Goal  discussed recent hospitalization with husband, discussed frequent COPD exacerbations and COPD action plan, reviewed medications including antianxiety, reconciled meds to discharge AVS, confirmed meds in home and administered as written  [facility] CM Short Term Goal #1   Over the next 30 days, patient will take all medications as prescribed as evidenced by patient/husband reporting and review of EMR  [facility] CM Short Term Goal #1 Start Date  11/14/17  Interventions for Short Term Goal #1  reconciled medications to AVS, confirmed meds in home and administered per AVS. Discussed difference between daily and as needed medications.  [facility] CM Short Term  Goal #2   Over the next 30 days, patient will attend all scheduled  follow-up appointments as evidenced by patient reporting and review of EMR  [facility] CM Short Term Goal #2 Start Date  11/14/17  Interventions for Short Term Goal #2  reviewed all upcoming appointments in EMR with husband and discussed absence of transportation barriers.     [name] Rotunda RN, BSN [facility] Care Management Coordinator [phone]

## 2017-11-14 NOTE — Patient Outreach (Signed)
[facility]) Care Management  11/14/2017  Katherine [name] October 10, 1940 [id number]   Phone call to patient to follow up on community resource needs since her discharge from rehab. This social worker made 2 attempts to reach patient. First time patient's spouse stated that patient was "busy", second time she was asleep.  Plan: This social worker will make another attempt to reach patient on 11/15/17.   Sheralyn Boatman [facility] Care Management [phone]

## 2017-11-15 ENCOUNTER — Ambulatory Visit: Payer: Self-pay | Admitting: *Deleted

## 2017-11-15 ENCOUNTER — Telehealth: Payer: Self-pay | Admitting: Family Medicine

## 2017-11-15 ENCOUNTER — Other Ambulatory Visit: Payer: Self-pay | Admitting: *Deleted

## 2017-11-15 DIAGNOSIS — J9612 Chronic respiratory failure with hypercapnia: Secondary | ICD-10-CM | POA: Diagnosis not present

## 2017-11-15 DIAGNOSIS — I129 Hypertensive chronic kidney disease with stage 1 through stage 4 chronic kidney disease, or unspecified chronic kidney disease: Secondary | ICD-10-CM | POA: Diagnosis not present

## 2017-11-15 DIAGNOSIS — E1122 Type 2 diabetes mellitus with diabetic chronic kidney disease: Secondary | ICD-10-CM | POA: Diagnosis not present

## 2017-11-15 DIAGNOSIS — N183 Chronic kidney disease, stage 3 (moderate): Secondary | ICD-10-CM | POA: Diagnosis not present

## 2017-11-15 DIAGNOSIS — F3289 Other specified depressive episodes: Secondary | ICD-10-CM | POA: Diagnosis not present

## 2017-11-15 DIAGNOSIS — J9611 Chronic respiratory failure with hypoxia: Secondary | ICD-10-CM | POA: Diagnosis not present

## 2017-11-15 DIAGNOSIS — I2510 Atherosclerotic heart disease of native coronary artery without angina pectoris: Secondary | ICD-10-CM | POA: Diagnosis not present

## 2017-11-15 DIAGNOSIS — M1712 Unilateral primary osteoarthritis, left knee: Secondary | ICD-10-CM | POA: Diagnosis not present

## 2017-11-15 DIAGNOSIS — J441 Chronic obstructive pulmonary disease with (acute) exacerbation: Secondary | ICD-10-CM | POA: Diagnosis not present

## 2017-11-15 NOTE — Telephone Encounter (Signed)
Verbal orders OK?

## 2017-11-15 NOTE — Telephone Encounter (Signed)
Message relayed to patient. Verbalized understanding and denied questions.   

## 2017-11-15 NOTE — Telephone Encounter (Signed)
Verbal orders okay? Please advise.

## 2017-11-15 NOTE — Patient Outreach (Signed)
[facility]) Care Management  11/15/2017  Katherine [name] 1940-06-08 [id number]   Phone call to patient to assess for community resource needs following her discharge from [facility]. Per patient's spouse, she was asleep and requested that this social worker call back in the morning.  Plan: This Education officer, museum will call call back the morning of 104/19.   Katherine [name] [facility] Care Management [phone]

## 2017-11-15 NOTE — Telephone Encounter (Signed)
Copied from [place] [id number]. Topic: General - Other >> Nov 15, 2017 12:31 PM Margot Ables wrote: Reason for CRM: [facility] received referral to start [facility]. Nursing eval was done today. Requesting VO for SN visits for disease mgmt, medication mgmt and education. Frequency of 1 week 1, 2 week 1, 1 week 7, and 2 PRN visits . Also wanting VO for [name] aide 2x week for 4 weeks. SN will also bring in telehealth equipment for weight and vitals daily. Secure VM so ok to leave detailed msg if no answer. Please include first name, last name, and title of caller.

## 2017-11-16 ENCOUNTER — Ambulatory Visit: Payer: Self-pay | Admitting: *Deleted

## 2017-11-16 ENCOUNTER — Ambulatory Visit (INDEPENDENT_AMBULATORY_CARE_PROVIDER_SITE_OTHER): Payer: Medicare HMO | Admitting: Unknown Physician Specialty

## 2017-11-16 ENCOUNTER — Encounter: Payer: Self-pay | Admitting: Unknown Physician Specialty

## 2017-11-16 VITALS — BP 128/83 | HR 118 | Temp 98.4°F | Ht 60.0 in | Wt 264.2 lb

## 2017-11-16 DIAGNOSIS — I129 Hypertensive chronic kidney disease with stage 1 through stage 4 chronic kidney disease, or unspecified chronic kidney disease: Secondary | ICD-10-CM | POA: Diagnosis not present

## 2017-11-16 DIAGNOSIS — E119 Type 2 diabetes mellitus without complications: Secondary | ICD-10-CM

## 2017-11-16 DIAGNOSIS — Z09 Encounter for follow-up examination after completed treatment for conditions other than malignant neoplasm: Secondary | ICD-10-CM

## 2017-11-16 DIAGNOSIS — I2510 Atherosclerotic heart disease of native coronary artery without angina pectoris: Secondary | ICD-10-CM | POA: Diagnosis not present

## 2017-11-16 DIAGNOSIS — J441 Chronic obstructive pulmonary disease with (acute) exacerbation: Secondary | ICD-10-CM | POA: Diagnosis not present

## 2017-11-16 DIAGNOSIS — E662 Morbid (severe) obesity with alveolar hypoventilation: Secondary | ICD-10-CM | POA: Diagnosis not present

## 2017-11-16 DIAGNOSIS — N183 Chronic kidney disease, stage 3 (moderate): Secondary | ICD-10-CM | POA: Diagnosis not present

## 2017-11-16 DIAGNOSIS — R768 Other specified abnormal immunological findings in serum: Secondary | ICD-10-CM

## 2017-11-16 DIAGNOSIS — G4733 Obstructive sleep apnea (adult) (pediatric): Secondary | ICD-10-CM

## 2017-11-16 DIAGNOSIS — M1712 Unilateral primary osteoarthritis, left knee: Secondary | ICD-10-CM | POA: Diagnosis not present

## 2017-11-16 DIAGNOSIS — J449 Chronic obstructive pulmonary disease, unspecified: Secondary | ICD-10-CM | POA: Diagnosis not present

## 2017-11-16 DIAGNOSIS — J9612 Chronic respiratory failure with hypercapnia: Secondary | ICD-10-CM | POA: Diagnosis not present

## 2017-11-16 DIAGNOSIS — F3289 Other specified depressive episodes: Secondary | ICD-10-CM | POA: Diagnosis not present

## 2017-11-16 DIAGNOSIS — E1122 Type 2 diabetes mellitus with diabetic chronic kidney disease: Secondary | ICD-10-CM | POA: Diagnosis not present

## 2017-11-16 DIAGNOSIS — J9611 Chronic respiratory failure with hypoxia: Secondary | ICD-10-CM | POA: Diagnosis not present

## 2017-11-16 HISTORY — DX: Type 2 diabetes mellitus without complications: E11.9

## 2017-11-16 NOTE — Assessment & Plan Note (Signed)
Pt currently on Trajenta.  Last Hgb A1C was 7.7 2 months ago.  Since Trajenta can cause angio-edema, will DC.  Recheck Hgb A1C in 1 month

## 2017-11-16 NOTE — Assessment & Plan Note (Signed)
At baseline 

## 2017-11-16 NOTE — Assessment & Plan Note (Signed)
Unable to see rheumatology due to multiple hospitalizations.  Wrote note to [facility] for assistance

## 2017-11-16 NOTE — Progress Notes (Signed)
BP 128/83 (BP Location: Right Arm, Cuff Size: Normal)   Pulse (!) 118   Temp 98.4 F (36.9 C) (Oral)   Ht 5' (1.524 m)   Wt 264 lb 3.2 oz (119.8 kg)   SpO2 93%   BMI 51.60 kg/m    Subjective:    Patient ID: Katherine [name], female    DOB: [birth year], 77 y.o.   MRN: [id number]  HPI: [name] is a 77 y.o. female  Chief Complaint  Patient presents with  . Hospitalization Follow-up   Today she is here after discharge from rehab.  She is back to base-line with her breathing.  She is having pauses of breathing at night.    Diagnostic Tests Reviewed/Disposition:  Angio-edema.  Respiratory failure  Consults: Set up with : neurology 10/7, and pulmonology 10/8. 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 acetaminophen (TYLENOL) 650 MG CR tablet Take 650-1,300 mg by mouth every 8 (eight) hours as needed for pain.   [name] albuterol (ACCUNEB) 0.63 MG/3ML nebulizer solution Take 3 mLs by nebulization 3 (three) times daily as needed for wheezing.   [name] albuterol (PROVENTIL HFA;VENTOLIN HFA) 108 (90 Base) MCG/ACT inhaler Inhale 2 puffs into the lungs every 4 (four) hours as needed for wheezing or shortness of breath.   . ALPRAZolam (XANAX) 0.5 MG tablet Take 0.5 mg by mouth 2 (two) times daily as needed for anxiety.   [name] atorvastatin (LIPITOR) 80 MG tablet Take 80 mg by mouth daily.   . cetirizine (ZYRTEC) 10 MG tablet Take 5  mg by mouth at bedtime.   . cholecalciferol (VITAMIN D) 1000 units tablet Take 1,000 Units by mouth daily.   . clopidogrel (PLAVIX) 75 MG tablet Take 75 mg by mouth daily.   [name] diltiazem (CARDIZEM SR) 120 MG 12 hr capsule Take 120 mg by mouth 2 (two) times daily.   [name] docusate sodium (COLACE) 100 MG capsule Take 100 mg by mouth daily.   [name] FLUoxetine (PROZAC) 10 MG capsule Take 10 mg by mouth daily.   . Fluticasone-Umeclidin-Vilant 100-62.5-25 MCG/INH AEPB Inhale 1 puff into the lungs daily.   . furosemide (LASIX) 20 MG tablet Take 1 tablet (20 mg total) by mouth daily.   [name] gabapentin (NEURONTIN) 300 MG capsule Take 300 mg by mouth at bedtime.   [name] guaiFENesin (MUCINEX) 600 MG 12 hr tablet Take 1 tablet (600 mg total) by mouth 2 (two) times daily.   . hydrALAZINE (APRESOLINE) 25 MG tablet Take 25 mg by mouth 3 (three) times daily.   [name] ipratropium (ATROVENT) 0.03 % nasal spray Place 2 sprays into both nostrils every 12 (twelve) hours.   [name] ipratropium-albuterol (DUONEB) 0.5-2.5 (3) MG/3ML SOLN Take 3 mLs by nebulization every 6 (six) hours as needed (shortness of breath).   . levETIRAcetam (KEPPRA XR) 500 MG 24 hr tablet Take 2 tablets (1,000 mg total) by mouth daily.   . meclizine (ANTIVERT) 25 MG tablet Take 25 mg by mouth 2 (two) times daily as needed for dizziness. 11/14/2017: Husband is giving  to patient daily in AM  . methocarbamol (ROBAXIN) 500 MG tablet Take 500 mg by mouth every 8 (eight) hours as needed for muscle spasms.   . pantoprazole (PROTONIX) 40 MG tablet Take 40 mg by mouth daily.   . predniSONE (STERAPRED UNI-PAK 21 TAB) 10 MG (21) TBPK tablet As directed   . tiotropium (SPIRIVA) 18 MCG inhalation capsule Place 18 mcg into inhaler and inhale daily.   . [DISCONTINUED] linagliptin (TRADJENTA) 5 MG TABS tablet Take 1 tablet (5 mg total) by mouth daily.    No facility-administered encounter medications on file as of 11/16/2017.    Relevant past medical, surgical, family and social  history reviewed and updated as indicated. Interim medical history since our last visit reviewed. Allergies and medications reviewed and updated.  Review of Systems  Per HPI unless specifically indicated above     Objective:    BP 128/83 (BP Location: Right Arm, Cuff Size: Normal)   Pulse (!) 118   Temp 98.4 F (36.9 C) (Oral)   Ht 5' (1.524 m)   Wt 264 lb 3.2 oz (119.8 kg)   SpO2 93%   BMI 51.60 kg/m   Wt Readings from Last 3 Encounters:  11/16/17 264 lb 3.2 oz (119.8 kg)  11/13/17 261 lb 6.4 oz (118.6 kg)  11/09/17 277 lb (125.6 kg)    Physical Exam  Constitutional: She is oriented to person, place, and time. She appears well-developed and well-nourished.  Chronically ill appearing  HENT:  Head: Normocephalic and atraumatic.  Eyes: Conjunctivae and lids are normal. Right eye exhibits no discharge. Left eye exhibits no discharge. No scleral icterus.  Neck: Normal range of motion. Neck supple. No JVD present. Carotid bruit is not present.  Cardiovascular: Normal rate, regular rhythm and normal heart sounds.  Pulmonary/Chest: Effort normal. She has decreased breath sounds.  Abdominal: Normal appearance. There is no splenomegaly or hepatomegaly.  Musculoskeletal: Normal range of motion.  Left leg 2 plus edema  Neurological: She is alert and oriented to person, place, and time.  Skin: Skin is warm, dry and intact. No rash noted. No pallor.  Psychiatric: She has a normal mood and affect. Her behavior is normal. Judgment and thought content normal.    Results for orders placed or performed during the hospital encounter of 11/10/17  Glucose, capillary  Result Value Ref Range   Glucose-Capillary 132 (H) 70 - 99 mg/dL  Comprehensive metabolic panel  Result Value Ref Range   Sodium 146 (H) 135 - 145 mmol/L   Potassium 4.5 3.5 - 5.1 mmol/L   Chloride 107 98 - 111 mmol/L   CO2 30 22 - 32 mmol/L   Glucose, Bld 137 (H) 70 - 99 mg/dL   BUN 19 8 - 23 mg/dL   Creatinine, Ser 1.73  (H) 0.44 - 1.00 mg/dL   Calcium 8.7 (L) 8.9 - 10.3 mg/dL   Total Protein 6.3 (L) 6.5 - 8.1 g/dL   Albumin 2.9 (L) 3.5 - 5.0 g/dL   AST 20 15 - 41 U/L   ALT 19 0 - 44 U/L   Alkaline Phosphatase 60 38 - 126 U/L   Total Bilirubin 0.6 0.3 - 1.2 mg/dL   GFR calc non Af Amer 27 (L) >60 mL/min   GFR calc Af Amer 32 (L) >60 mL/min   Anion gap 9 5 - 15  Troponin I  Result Value Ref Range   Troponin I <0.03 <0.03 ng/mL  CBC with Differential  Result Value Ref Range  WBC 6.7 3.6 - 11.0 K/uL   RBC 3.27 (L) 3.80 - 5.20 MIL/uL   Hemoglobin 9.6 (L) 12.0 - 16.0 g/dL   HCT 28.2 (L) 35.0 - 47.0 %   MCV 86.3 80.0 - 100.0 fL   MCH 29.4 26.0 - 34.0 pg   MCHC 34.0 32.0 - 36.0 g/dL   RDW 20.3 (H) 11.5 - 14.5 %   Platelets 195 150 - 440 K/uL   Neutrophils Relative % 77 %   Neutro Abs 5.1 1.4 - 6.5 K/uL   Lymphocytes Relative 14 %   Lymphs Abs 0.9 (L) 1.0 - 3.6 K/uL   Monocytes Relative 9 %   Monocytes Absolute 0.6 0.2 - 0.9 K/uL   Eosinophils Relative 0 %   Eosinophils Absolute 0.0 0 - 0.7 K/uL   Basophils Relative 0 %   Basophils Absolute 0.0 0 - 0.1 K/uL  Lactic acid, plasma  Result Value Ref Range   Lactic Acid, Venous 1.2 0.5 - 1.9 mmol/L  Urinalysis, Complete w Microscopic  Result Value Ref Range   Color, Urine YELLOW (A) YELLOW   APPearance CLEAR (A) CLEAR   Specific Gravity, Urine 1.014 1.005 - 1.030   pH 5.0 5.0 - 8.0   Glucose, UA NEGATIVE NEGATIVE mg/dL   Hgb urine dipstick NEGATIVE NEGATIVE   Bilirubin Urine NEGATIVE NEGATIVE   Ketones, ur NEGATIVE NEGATIVE mg/dL   Protein, ur NEGATIVE NEGATIVE mg/dL   Nitrite NEGATIVE NEGATIVE   Leukocytes, UA TRACE (A) NEGATIVE   RBC / HPF 0-5 0 - 5 RBC/hpf   WBC, UA 0-5 0 - 5 WBC/hpf   Bacteria, UA NONE SEEN NONE SEEN   Squamous Epithelial / LPF 0-5 0 - 5   Mucus PRESENT    Hyaline Casts, UA PRESENT    Amorphous Crystal PRESENT   Blood gas, venous  Result Value Ref Range   FIO2 0.21    pH, Ven 7.31 7.250 - 7.430   pCO2, Ven 64 (H)  44.0 - 60.0 mmHg   pO2, Ven 36.0 32.0 - 45.0 mmHg   Bicarbonate 32.2 (H) 20.0 - 28.0 mmol/L   Acid-Base Excess 4.7 (H) 0.0 - 2.0 mmol/L   O2 Saturation 62.7 %   Patient temperature 37.0    Collection site VENOUS    Sample type VENOUS   CBC  Result Value Ref Range   WBC 6.4 3.6 - 11.0 K/uL   RBC 3.51 (L) 3.80 - 5.20 MIL/uL   Hemoglobin 10.1 (L) 12.0 - 16.0 g/dL   HCT 30.6 (L) 35.0 - 47.0 %   MCV 87.4 80.0 - 100.0 fL   MCH 28.7 26.0 - 34.0 pg   MCHC 32.9 32.0 - 36.0 g/dL   RDW 20.3 (H) 11.5 - 14.5 %   Platelets 203 150 - 440 K/uL  Creatinine, serum  Result Value Ref Range   Creatinine, Ser 1.60 (H) 0.44 - 1.00 mg/dL   GFR calc non Af Amer 30 (L) >60 mL/min   GFR calc Af Amer 35 (L) >60 mL/min  Basic metabolic panel  Result Value Ref Range   Sodium 143 135 - 145 mmol/L   Potassium 4.6 3.5 - 5.1 mmol/L   Chloride 105 98 - 111 mmol/L   CO2 29 22 - 32 mmol/L   Glucose, Bld 184 (H) 70 - 99 mg/dL   BUN 24 (H) 8 - 23 mg/dL   Creatinine, Ser 1.68 (H) 0.44 - 1.00 mg/dL   Calcium 8.8 (L) 8.9 - 10.3 mg/dL   GFR calc  non Af Amer 28 (L) >60 mL/min   GFR calc Af Amer 33 (L) >60 mL/min   Anion gap 9 5 - 15  CBC  Result Value Ref Range   WBC 5.5 3.6 - 11.0 K/uL   RBC 3.42 (L) 3.80 - 5.20 MIL/uL   Hemoglobin 9.7 (L) 12.0 - 16.0 g/dL   HCT 29.5 (L) 35.0 - 47.0 %   MCV 86.3 80.0 - 100.0 fL   MCH 28.2 26.0 - 34.0 pg   MCHC 32.7 32.0 - 36.0 g/dL   RDW 20.6 (H) 11.5 - 14.5 %   Platelets 213 150 - 440 K/uL  Procalcitonin - Baseline  Result Value Ref Range   Procalcitonin <0.10 ng/mL  Ferritin  Result Value Ref Range   Ferritin 25 11 - 307 ng/mL  Iron and TIBC  Result Value Ref Range   Iron 28 28 - 170 ug/dL   TIBC 352 250 - 450 ug/dL   Saturation Ratios 8 (L) 10.4 - 31.8 %   UIBC 324 ug/dL  Folate  Result Value Ref Range   Folate 37.0 >5.9 ng/mL  Vitamin B12  Result Value Ref Range   Vitamin B-12 147 (L) 180 - 914 pg/mL  Glucose, capillary  Result Value Ref Range    Glucose-Capillary 121 (H) 70 - 99 mg/dL  Glucose, capillary  Result Value Ref Range   Glucose-Capillary 183 (H) 70 - 99 mg/dL  Glucose, capillary  Result Value Ref Range   Glucose-Capillary 181 (H) 70 - 99 mg/dL  Glucose, capillary  Result Value Ref Range   Glucose-Capillary 223 (H) 70 - 99 mg/dL  Glucose, capillary  Result Value Ref Range   Glucose-Capillary 221 (H) 70 - 99 mg/dL   Comment 1 Notify RN    Comment 2 Document in Chart   Glucose, capillary  Result Value Ref Range   Glucose-Capillary 193 (H) 70 - 99 mg/dL   Comment 1 Notify RN    Comment 2 Document in Chart   Glucose, capillary  Result Value Ref Range   Glucose-Capillary 240 (H) 70 - 99 mg/dL   Comment 1 Notify RN    Comment 2 Document in Chart   Glucose, capillary  Result Value Ref Range   Glucose-Capillary 204 (H) 70 - 99 mg/dL   Comment 1 Notify RN    Comment 2 Document in Chart   Glucose, capillary  Result Value Ref Range   Glucose-Capillary 191 (H) 70 - 99 mg/dL   Comment 1 Notify RN    Comment 2 Document in Chart   Glucose, capillary  Result Value Ref Range   Glucose-Capillary 164 (H) 70 - 99 mg/dL   Comment 1 Notify RN    Comment 2 Document in Chart   Glucose, capillary  Result Value Ref Range   Glucose-Capillary 216 (H) 70 - 99 mg/dL   Comment 1 Notify RN    Comment 2 Document in Chart       Assessment & Plan:   Problem List Items Addressed This Visit      Unprioritized   COPD (chronic obstructive pulmonary disease) (HCC)    At baseline      Diabetes mellitus type 2, uncomplicated (HCC)    Pt currently on Trajenta.  Last Hgb A1C was 7.7 2 months ago.  Since Trajenta can cause angio-edema, will DC.  Recheck Hgb A1C in 1 month      Elevated rheumatoid factor    Unable to see rheumatology due to multiple hospitalizations.  Wrote note  to 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  Probably related to diuresis       OSA (obstructive sleep apnea)    Noted increased pauses at night.  Pt will discuss with [ulmonary at next visit       Other Visit Diagnoses    Hospital discharge follow-up    -  Primary       Follow up plan: Return in about 4 weeks (around 12/14/2017).

## 2017-11-16 NOTE — Assessment & Plan Note (Signed)
She did lose 14 pounds since hospitalization.  Probably related to diuresis

## 2017-11-16 NOTE — Assessment & Plan Note (Signed)
Noted increased pauses at night.  Pt will discuss with [ulmonary at next visit

## 2017-11-17 DIAGNOSIS — J9612 Chronic respiratory failure with hypercapnia: Secondary | ICD-10-CM | POA: Diagnosis not present

## 2017-11-17 DIAGNOSIS — I2510 Atherosclerotic heart disease of native coronary artery without angina pectoris: Secondary | ICD-10-CM | POA: Diagnosis not present

## 2017-11-17 DIAGNOSIS — J9611 Chronic respiratory failure with hypoxia: Secondary | ICD-10-CM | POA: Diagnosis not present

## 2017-11-17 DIAGNOSIS — N183 Chronic kidney disease, stage 3 (moderate): Secondary | ICD-10-CM | POA: Diagnosis not present

## 2017-11-17 DIAGNOSIS — F3289 Other specified depressive episodes: Secondary | ICD-10-CM | POA: Diagnosis not present

## 2017-11-17 DIAGNOSIS — J441 Chronic obstructive pulmonary disease with (acute) exacerbation: Secondary | ICD-10-CM | POA: Diagnosis not present

## 2017-11-17 DIAGNOSIS — I129 Hypertensive chronic kidney disease with stage 1 through stage 4 chronic kidney disease, or unspecified chronic kidney disease: Secondary | ICD-10-CM | POA: Diagnosis not present

## 2017-11-17 DIAGNOSIS — M1712 Unilateral primary osteoarthritis, left knee: Secondary | ICD-10-CM | POA: Diagnosis not present

## 2017-11-17 DIAGNOSIS — E1122 Type 2 diabetes mellitus with diabetic chronic kidney disease: Secondary | ICD-10-CM | POA: Diagnosis not present

## 2017-11-19 ENCOUNTER — Ambulatory Visit: Payer: Medicare HMO | Admitting: Adult Health

## 2017-11-19 ENCOUNTER — Other Ambulatory Visit: Payer: Self-pay

## 2017-11-19 ENCOUNTER — Encounter: Payer: Self-pay | Admitting: Adult Health

## 2017-11-19 ENCOUNTER — Telehealth: Payer: Self-pay | Admitting: Family Medicine

## 2017-11-19 VITALS — BP 114/80 | HR 118 | Resp 24 | Wt 264.0 lb

## 2017-11-19 DIAGNOSIS — E119 Type 2 diabetes mellitus without complications: Secondary | ICD-10-CM | POA: Diagnosis not present

## 2017-11-19 DIAGNOSIS — I639 Cerebral infarction, unspecified: Secondary | ICD-10-CM | POA: Diagnosis not present

## 2017-11-19 DIAGNOSIS — R569 Unspecified convulsions: Secondary | ICD-10-CM | POA: Diagnosis not present

## 2017-11-19 DIAGNOSIS — R2990 Unspecified symptoms and signs involving the nervous system: Secondary | ICD-10-CM

## 2017-11-19 DIAGNOSIS — G4733 Obstructive sleep apnea (adult) (pediatric): Secondary | ICD-10-CM

## 2017-11-19 DIAGNOSIS — E785 Hyperlipidemia, unspecified: Secondary | ICD-10-CM | POA: Diagnosis not present

## 2017-11-19 DIAGNOSIS — I10 Essential (primary) hypertension: Secondary | ICD-10-CM | POA: Diagnosis not present

## 2017-11-19 MED ORDER — LEVETIRACETAM ER 500 MG PO TB24: 500.0000 mg | ORAL_TABLET | Freq: Every day | ORAL | 3 refills | Status: DC

## 2017-11-19 NOTE — Telephone Encounter (Signed)
Katherine [name] with [facility] notified of "OK to give verbal order"

## 2017-11-19 NOTE — Telephone Encounter (Signed)
Routing to Dr. Johnson

## 2017-11-19 NOTE — Telephone Encounter (Signed)
Copied from [place] [phone]. Topic: Quick Communication - See Telephone Encounter >> Nov 19, 2017  4:08 PM Ivar Drape wrote: CRM for notification. See Telephone encounter for: 11/19/17. Colletta Maryland w/[facility] [phone] would like a verbal order for a Speech Therapy Consult.

## 2017-11-19 NOTE — Progress Notes (Signed)
[facility] [address] [place]. [place] [address] [phone]       OFFICE FOLLOW UP NOTE  [name] Date of Birth:  1940-11-21 Medical Record Number:  [id number]   Reason for Referral:  hospital stroke follow up  CHIEF COMPLAINT:  Chief Complaint  Patient presents with  . Cerebrovascular Accident    Katherine [name], is here with her husband [name] and her dtr.-in-law Zigmund Daniel for  f/u.  Seen at [facility] on 10/04/16 with hx. of aphasia for several mos. MRI brain and MRA head showed old right basal ganglia lacunar infarct and 55mm A-comm aneurysm. Was given TPA/fim    HPI: Katherine [name] is being seen today for initial visit in the office for strokelike episode s/p IV TPA concerning for seizure activity on 10/04/2017. History obtained from patient, husband and daughter and chart review. Reviewed all radiology images and labs personally.  Ms.Katherine [name] a 77 y.o.femalewith history of CKD, HTN, LVH, tobacco use and recent COPD admissiondischarged 8/22, who presented to [facility] with expressive aphasia. CT head did not show acute abnormalities therefore patient received IV TPA 10/04/2017 at 0642. CTA head and neck was obtained and was negative for LVO.  Patient was then transferred to [facility] for further evaluation and treatment.  In route of transfer, patient began bleeding from her mouth and coughed copious amounts of blood and upon arrival, bleeding subsided along with need of suctioning multiple times.  Fortunately, patient was alert and oriented enough to be able to suction herself therefore intubation was not needed.  MRI head reviewed and was negative for acute stroke but did show evidence of small vessel disease along with old right basal ganglia lacune.  MRA did not show a large vessel occlusion but did show mild arthrosclerosis with 4 mm ACOM aneurysm.  2D echo showed an EF of 60 to 65% without cardiac source of embolus.  EEG showed occasional FIRDA therefore  was started on Keppra ER 1000 mg daily.  Patient was previously on Plavix 25 mg and is recommended to continue this at discharge.  LDL 73 and recommended continuation of Lipitor 80 mg.  A1c 7.7 and recommended continued follow-up with PCP for DM management.  Palliative care consult was performed during hospitalization due to history of COPD with multiple exacerbations but per notes, patient desired full code with full scope of treatment.  Recommended to follow-up with Dr. Estanislado Pandy outpatient for aneurysm.  Patient was discharged to SNF in stable condition.  Since discharge, patient was seen in the ED on 11/02/2017 for altered mental status with difficulty talking due to swelling of her tongue and lips along with shortness of breath and lower extremity edema.  Imaging negative along with negative EEG.  Felt as though this was due to COPD exacerbation and recommended follow-up with pulmonology as outpatient.  Patient is being seen today for hospital follow-up and is accompanied by her husband and daughter-in-law.  She has not had any additional episodes of aphasia and has been declines any of these episodes occurring in the past.  Denies any type of seizure history.  She continues to take Keppra 1000mg  daily without side effects.  She continues to take Plavix without side effects of bleeding or bruising.  Continues to take Lipitor 80 mg without side effects of myalgias.  She is currently sitting in wheelchair as it is difficult for her to ambulate long distance.  She is able to ambulate short distances with assistance of rolling walker.  She is currently  wearing 2 L O2 via [place] and does have follow-up appointment with pulmonology tomorrow.  Will be starting home PT/OT/ST soon for continued generalized weakness.  Blood pressure today satisfactory 114/80.  Patient states overall she has been feeling well without any complaints or concerns.  Denies new or worsening stroke/TIA symptoms.  ROS:   14 system review of  systems performed and negative with exception of no complaints  PMH:  Past Medical History:  Diagnosis Date  . Angioedema   . Anxiety   . Anxiety and depression   . Chronic constipation   . Chronic kidney disease    stage 3  . COPD (chronic obstructive pulmonary disease) ([place])   . Gallstones   . GERD (gastroesophageal reflux disease)   . Hypertension   . Left ventricular hypertrophy   . Osteoarthritis   . Prurigo nodularis   . Seizures ([place])   . Stroke ([place])   . Tobacco abuse   . Vitamin D deficiency disease     PSH:  Past Surgical History:  Procedure Laterality Date  . CHOLECYSTECTOMY    . 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   Procedure: TOTAL KNEE ARTHROPLASTY;  Surgeon: Hessie Knows, MD;  Location: [facility] ORS;  Service: Orthopedics;  Laterality: Right;    Social History:  Social History   Socioeconomic History  . Marital status: Married    Spouse name: [name]   . Number of children: 3  . Years of education: 61  . Highest education level: 11th grade  Occupational History  . Occupation: retired  Scientific laboratory technician  . Financial resource strain: Not hard at all  . Food insecurity:    Worry: Never true    Inability: Never true  . Transportation needs:    Medical: No    Non-medical: No  Tobacco Use  . Smoking status: Former Smoker    Packs/day: 0.50    Years: 60.00    Pack years: 30.00    Types: Cigarettes  . Smokeless tobacco: Never Used  Substance and Sexual Activity  . Alcohol use: No    Alcohol/week: 0.0 standard drinks    Comment: rare  . Drug use: No  . Sexual activity: Not Currently  Lifestyle  . Physical activity:    Days per week: 0 days    Minutes per session: 0 min  . Stress: Not at all  Relationships  . Social connections:    Talks on phone: More than three times a week    Gets together: Once a week    Attends religious service: 1 to 4 times per year    Active member of club or organization: No     Attends meetings of clubs or organizations: Never    Relationship status: Married  . Intimate partner violence:    Fear of current or ex partner: No    Emotionally abused: No    Physically abused: No    Forced sexual activity: No  Other Topics Concern  . Not on file  Social History Narrative  . Not on file    Family History:  Family History  Problem Relation Age of Onset  . Alcohol abuse Mother   . Sickle cell anemia Daughter   . Hypertension Son   . Cancer Neg Hx   . COPD Neg Hx   . Diabetes Neg Hx   . Heart disease Neg Hx   . Stroke Neg Hx     Medications:   Current Outpatient  Medications on File Prior to Visit  Medication Sig Dispense Refill  . acetaminophen (TYLENOL) 650 MG CR tablet Take 650-1,300 mg by mouth every 8 (eight) hours as needed for pain.    [name] albuterol (ACCUNEB) 0.63 MG/3ML nebulizer solution Take 3 mLs by nebulization 3 (three) times daily as needed for wheezing.    [name] albuterol (PROVENTIL HFA;VENTOLIN HFA) 108 (90 Base) MCG/ACT inhaler Inhale 2 puffs into the lungs every 4 (four) hours as needed for wheezing or shortness of breath.    . ALPRAZolam (XANAX) 0.5 MG tablet Take 0.5 mg by mouth 2 (two) times daily as needed for anxiety.    [name] atorvastatin (LIPITOR) 80 MG tablet Take 80 mg by mouth daily.    . cetirizine (ZYRTEC) 10 MG tablet Take 5 mg by mouth at bedtime.    . cholecalciferol (VITAMIN D) 1000 units tablet Take 1,000 Units by mouth daily.    . clopidogrel (PLAVIX) 75 MG tablet Take 75 mg by mouth daily.    [name] diltiazem (CARDIZEM SR) 120 MG 12 hr capsule Take 120 mg by mouth 2 (two) times daily.    [name] docusate sodium (COLACE) 100 MG capsule Take 100 mg by mouth daily.    [name] FLUoxetine (PROZAC) 10 MG capsule Take 10 mg by mouth daily.    . Fluticasone-Umeclidin-Vilant 100-62.5-25 MCG/INH AEPB Inhale 1 puff into the lungs daily.    . furosemide (LASIX) 20 MG tablet Take 1 tablet (20 mg total) by mouth daily. 30 tablet 0  . gabapentin (NEURONTIN) 300 MG  capsule Take 300 mg by mouth at bedtime.    [name] guaiFENesin (MUCINEX) 600 MG 12 hr tablet Take 1 tablet (600 mg total) by mouth 2 (two) times daily. 30 tablet 0  . hydrALAZINE (APRESOLINE) 25 MG tablet Take 25 mg by mouth 3 (three) times daily.    [name] ipratropium (ATROVENT) 0.03 % nasal spray Place 2 sprays into both nostrils every 12 (twelve) hours.    [name] ipratropium-albuterol (DUONEB) 0.5-2.5 (3) MG/3ML SOLN Take 3 mLs by nebulization every 6 (six) hours as needed (shortness of breath).    . levETIRAcetam (KEPPRA XR) 500 MG 24 hr tablet Take 2 tablets (1,000 mg total) by mouth daily. 30 tablet 11  . meclizine (ANTIVERT) 25 MG tablet Take 25 mg by mouth 2 (two) times daily as needed for dizziness.    . methocarbamol (ROBAXIN) 500 MG tablet Take 500 mg by mouth every 8 (eight) hours as needed for muscle spasms.    . pantoprazole (PROTONIX) 40 MG tablet Take 40 mg by mouth daily.    [name] tiotropium (SPIRIVA) 18 MCG inhalation capsule Place 18 mcg into inhaler and inhale daily.     No current facility-administered medications on file prior to visit.     Allergies:   Allergies  Allergen Reactions  . Bee Venom Swelling  . Enalapril Maleate Swelling     Physical Exam  Vitals:   11/19/17 1332  BP: 114/80  Pulse: (!) 118  Resp: (!) 24  Weight: 264 lb (119.7 kg)   Body mass index is 51.56 kg/m. No exam data present  General: Morbidly obese African-American female,, seated, in no evident distress Head: head normocephalic and atraumatic.   Neck: supple with no carotid or supraclavicular bruits Cardiovascular: regular rate and rhythm, no murmurs Musculoskeletal: no deformity Skin:  no rash/petichiae Vascular:  Normal pulses all extremities  Neurologic Exam Mental Status: Awake and fully alert. Oriented to place and time. Recent and remote memory intact. Attention  span, concentration and fund of knowledge appropriate. Mood and affect appropriate.  Cranial Nerves: Fundoscopic exam reveals  sharp disc margins. Pupils equal, briskly reactive to light. Extraocular movements full without nystagmus. Visual fields full to confrontation. Hearing intact. Facial sensation intact. Face, tongue, palate moves normally and symmetrically.  Motor: Normal bulk and tone. Normal strength in all tested extremity muscles. Sensory.: intact to touch , pinprick , position and vibratory sensation.  Coordination: Rapid alternating movements normal in all extremities. Finger-to-nose and heel-to-shin performed accurately bilaterally. Gait and Station: Patient is currently sitting in wheelchair and as rolling walker not present, gait assessment deferred Reflexes: 1+ and symmetric. Toes downgoing.    NIHSS  0 Modified Rankin  0    Diagnostic Data (Labs, Imaging, Testing)  Ct Head Code Stroke Wo Contrast 10/04/2017 1. No acute intracranial process. 2. Mild-to-moderate chronic small vessel ischemic changes. Old lacunar infarct. 3. ASPECTS is 10.    Ct Angio Head W Or Wo Contrast Ct Angio Neck W Or Wo Contrast 10/04/2017 Aortic atherosclerosis. Brachiocephalic vessel tortuosity suggesting a history of hypertension. Atherosclerotic plaque at the carotid bifurcations and internal carotid artery bulbs but no stenosis. No intracranial stenosis or occlusion.   MRI HEAD:  10/05/2017 1. No acute intracranial process.  2. Mild-to-moderate chronic small vessel ischemic changes. Old RIGHT basal ganglia lacunar infarct. 3. Worsening paranasal sinusitis and mastoid effusions.   MRA HEAD:  10/05/2017 1. No emergent large vessel occlusion or flow-limiting stenosis.  2. Mild intracranial atherosclerosis.  3. 4 mm A-comm aneurysm.Neuro-Interventional Radiology consultation is suggested to evaluate the appropriateness of potential treatment.   Dg Chest Port 1 View 10/05/2017 IMPRESSION:  1. Cardiomegaly. No pulmonary venous congestion.  2. Mild basilar atelectasis, improved from prior exams.   Ct Chest  High Resolution 10/09/2017 1. No evidence of interstitial lung disease. 2. Mild centrilobular emphysema with mild diffuse bronchial wall thickening, suggesting COPD. 3. Scattered mildly thickened parenchymal bands in the mid to lower lungs bilaterally, most prominent in the medial lower lobes, mildly increased from prior chest CT, compatible with nonspecific postinfectious/postinflammatory scarring versus subsegmental atelectasis. Stable dilated main pulmonary artery, suggesting chronic pulmonary arterial hypertension. 4. Three-vessel coronary atherosclerosis. Aortic Atherosclerosis   Transthoracic Echocardiogram 10/05/2017 Left ventricle: The cavity size was normal. Systolic function was normal. The estimated ejection fraction was in the range of 60% to 65%.Wall motion was normal; there were no regional wall motion abnormalities. There was an increased relative contribution of atrial contraction to ventricular filling, which may be seen with aging. The deceleration time of the early transmitral flow velocity was normal. The pulmonary vein flow pattern was normal. The tissue Doppler parameters were normal. Left ventricular diastolic function parameters were normal.  EEG 10/05/2017 The recording shows occasional frontal intermittent rhythmic delta activity typically seen in toxic metabolic processes. Otherwise, the study is a normal recording of awake and drowsy states.    ASSESSMENT: Brittnae Aschenbrenner is a 77 y.o. year old female here with strokelike episode s/p IV tPA concerning for seizure on 10/04/2017. Vascular risk factors include CKD, DM, HTN, HLD, COPD and morbid obesity.     PLAN: -Continue clopidogrel 75 mg daily  and Lipitor 80 mg for secondary stroke prevention -F/u with PCP regarding your HLD, DM and HTN management -This patient has not had additional episodes of recent EEG negative for seizure activity, will start to wean Keppra.  Recommend Keppra 500 mg in the evening and if no  further seizure activity or episodes of aphasia, will repeat EEG and  if remains stable, will decrease further at that time -f/u with pulmonologist for continued COPD and other chronic issues -Start home PT/OT/ST for continued strengthening and improvement -continue to monitor BP at home -advised to continue to stay active and maintain a healthy diet -Maintain strict control of hypertension with blood pressure goal below 130/90, diabetes with hemoglobin A1c goal below 6.5% and cholesterol with LDL cholesterol (bad cholesterol) goal below 70 mg/dL. I also advised the patient to eat a healthy diet with plenty of whole grains, cereals, fruits and vegetables, exercise regularly and maintain ideal body weight.  Follow up in 3 months or call earlier if needed   Greater than 50% of time during this 25 minute visit was spent on counseling,explanation of diagnosis of strokelike episode, reviewing risk factor management of DM, HTN, HLD and COPD, planning of further management, discussion with patient and family and coordination of care    Venancio Poisson, AGNP-BC  [facility] [address] [place], [place] [address]  Phone [phone] Fax [phone] Note: This document was prepared with digital dictation and possible smart phrase technology. Any transcriptional errors that result from this process are unintentional.

## 2017-11-19 NOTE — Telephone Encounter (Signed)
OK to give verbal.

## 2017-11-19 NOTE — Patient Outreach (Signed)
[facility]) Care Management   11/19/2017  Katherine [name] 1940-06-13 [id number]  Katherine [name] is an 77 y.o. female  Subjective: "I'm doing fine". Husband states patient is at her baseline since returning home from her most recent hospital visit. States "Since her stroke, she lays in bed all the time. She only gets up to go to the bathroom but then comes straight back to bed". Husband states "When she has a breathing problem, the only thing I am going to do is call 911. That's what they told me to do at the doctors office and that's what I will continue to do is call 911. That's my action plan"  Objective:   BP 100/68 (BP Location: Right Arm, Patient Position: Sitting, Cuff Size: Normal)   Pulse (!) 106   Resp 20   SpO2 97%   Physical Exam  Constitutional: She is oriented to person, place, and time. She appears well-developed and well-nourished.  Morbidly obese with BMI 51.6  HENT:  Patient HOH according to husband post stroke  Cardiovascular: Normal rate, regular rhythm, normal heart sounds and intact distal pulses.  2+ non-pitting edema right lower ext, 3+ non-pitting left lower ext  Respiratory: Effort normal. No respiratory distress. She has no wheezes. She has no rales.  Breath sounds diminished throughout  GI: Soft. Bowel sounds are normal.  Abdomen soft and rounded  Musculoskeletal: Normal range of motion.  Neurological: She is oriented to person, place, and time.  Patient easily drifts off to sleep during conversation however she arouses easily with name called. Responds appropriately to questions. Husband states this is patients norm. Patient is hard to understand related to aphasia/slurred speech.  Skin: Skin is warm and dry.  Psychiatric: She has a normal mood and affect. Her behavior is normal. Judgment and thought content normal.  sleepy    Encounter Medications:   Outpatient Encounter Medications as of 11/19/2017  Medication Sig Note  .  acetaminophen (TYLENOL) 650 MG CR tablet Take 650-1,300 mg by mouth every 8 (eight) hours as needed for pain.   [name] albuterol (ACCUNEB) 0.63 MG/3ML nebulizer solution Take 3 mLs by nebulization 3 (three) times daily as needed for wheezing.   [name] albuterol (PROVENTIL HFA;VENTOLIN HFA) 108 (90 Base) MCG/ACT inhaler Inhale 2 puffs into the lungs every 4 (four) hours as needed for wheezing or shortness of breath.   . ALPRAZolam (XANAX) 0.5 MG tablet Take 0.5 mg by mouth 2 (two) times daily as needed for anxiety.   [name] atorvastatin (LIPITOR) 80 MG tablet Take 80 mg by mouth daily.   . cetirizine (ZYRTEC) 10 MG tablet Take 5 mg by mouth at bedtime.   . cholecalciferol (VITAMIN D) 1000 units tablet Take 1,000 Units by mouth daily.   . clopidogrel (PLAVIX) 75 MG tablet Take 75 mg by mouth daily.   [name] diltiazem (CARDIZEM SR) 120 MG 12 hr capsule Take 120 mg by mouth 2 (two) times daily.   [name] docusate sodium (COLACE) 100 MG capsule Take 100 mg by mouth daily.   [name] FLUoxetine (PROZAC) 10 MG capsule Take 10 mg by mouth daily.   . Fluticasone-Umeclidin-Vilant 100-62.5-25 MCG/INH AEPB Inhale 1 puff into the lungs daily.   . furosemide (LASIX) 20 MG tablet Take 1 tablet (20 mg total) by mouth daily.   [name] gabapentin (NEURONTIN) 300 MG capsule Take 300 mg by mouth at bedtime.   [name] guaiFENesin (MUCINEX) 600 MG 12 hr tablet Take 1 tablet (600 mg total) by mouth 2 (two) times  daily.   . hydrALAZINE (APRESOLINE) 25 MG tablet Take 25 mg by mouth 3 (three) times daily.   [name] ipratropium (ATROVENT) 0.03 % nasal spray Place 2 sprays into both nostrils every 12 (twelve) hours.   [name] ipratropium-albuterol (DUONEB) 0.5-2.5 (3) MG/3ML SOLN Take 3 mLs by nebulization every 6 (six) hours as needed (shortness of breath).   . levETIRAcetam (KEPPRA XR) 500 MG 24 hr tablet Take 2 tablets (1,000 mg total) by mouth daily.   . meclizine (ANTIVERT) 25 MG tablet Take 25 mg by mouth 2 (two) times daily as needed for dizziness. 11/14/2017: Husband is  giving to patient daily in AM  . methocarbamol (ROBAXIN) 500 MG tablet Take 500 mg by mouth every 8 (eight) hours as needed for muscle spasms.   . pantoprazole (PROTONIX) 40 MG tablet Take 40 mg by mouth daily.   . predniSONE (STERAPRED UNI-PAK 21 TAB) 10 MG (21) TBPK tablet As directed   . tiotropium (SPIRIVA) 18 MCG inhalation capsule Place 18 mcg into inhaler and inhale daily.    No facility-administered encounter medications on file as of 11/19/2017.     Functional Status:   In your present state of health, do you have any difficulty performing the following activities: 11/19/2017 11/10/2017  Hearing? Y Y  Comment - -  Vision? N N  Difficulty concentrating or making decisions? Y N  Walking or climbing stairs? Y Y  Dressing or bathing? Y Y  Doing errands, shopping? Katherine [name]  Preparing Food and eating ? Y -  Using the Toilet? Y -  In the past six months, have you accidently leaked urine? Y -  Do you have problems with loss of bowel control? N -  Managing your Medications? Y -  Managing your Finances? Y -  Housekeeping or managing your Housekeeping? Y -  Some recent data might be hidden    Fall/Depression Screening:    Fall Risk  11/19/2017 11/14/2017 07/16/2017  Falls in the past year? Yes Yes No  Number falls in past yr: 1 2 or more -  Injury with Fall? Yes No -  Comment "just bruised and a little swelling in my legs and feet" - -  Risk Factor Category  High Fall Risk High Fall Risk -  Risk for fall due to : History of fall(s) - Impaired balance/gait;Impaired mobility  Follow up Falls evaluation completed;Falls prevention discussed - -   PHQ 2/9 Scores 11/19/2017 11/06/2017 09/26/2017 06/05/2017 03/21/2017 09/12/2016 09/06/2016  PHQ - 2 Score 3 0 0 0 1 0 0  PHQ- 9 Score 12 3 0 - 3 6 -    Assessment: Upon RN CM arrival, CM was met at the door by patient's spouse, Katherine [name]. Patient was asleep in bed. Home "smoky". Husband admits to smoking however he is "trying to cut back". He says he  only smokes outside. Patient hard to arouse. She is HOH since recent stroke. Upon awakening, patient able to answer questions appropriately however she is extremely sleepy and just wants to go back to sleep. Per spouse this is patients norm. She was observed getting up and going to the bathroom with spouse's help using rolling walker. Slow steady gait noted.  Per patient, she requested spouse answer most questions for her. RN CM attempted several times to solicit information from patient including her decision to update consent. Her son was listed on previous consent however during telephonic contact with spouse last month, spouse had requested son (patients son not his) be removed  from consent. Patient was able to state she no longer wished to have her son on [facility] consent. New consent has been signed during this encounter only listing spouse as person to discuss health information with. Patient did complete depression screening.  RN CM collaborated with [name] last week after patients post hospitalization follow up. Ms. Julian Hy asked RN CM to assist patient/husband with obtaining rheumatology appointment ASAP. Discussed with husband recent labs as previously discussed with patient and husband by Ms. Wicker. Husband agreed to follow up with rheumatology however he was extremely reluctant as he did not see how rheumatologist could help her current situation. "Her joints hurt because of the type of work she did when she was younger. RN CM secured appointment for Tuesday, 10/15 at 10:00 with Dr. Annalee Genta at [facility].   RN CM discussed with patient her Code Status as documentation in Epic (Most form dated 05/17/17) indicates patient wants only limited interventions. Today patient indicates "I want everything done and them to bring me back so I can see my grandkids grow"  RN CM attempted to discuss COPD action plan with patient and spouse. Spouse insistent that he WILL ALWAYS call 911 when  patient indicates any type of breathing difficulty or medical illness. RN CM asked husband if he ever attempted to give patient a nebulizer treatment or use her rescue inhaler prior to symptom escalation. He said "NO". Spouse feels patient's condition has no warning signs. Spouse indicates that patient goes into respiratory failure very quickly. Discussed with spouse triggers such as mold, fumes, and exposure to second hand cigarette smoke. Spouse verbalized understanding.  Per Spouse, [facility] has established services in home, however he is unsure when someone will be back out. He is also unsure if palliative care services is involved. RN CM confirmed with [facility] that patient refused services in April and no new referral pending. Per EMR documentation, [facility] has requested "SN visits for disease mgmt, medication mgmt and education. Frequency of 1 week 1, 2 week 1, 1 week 7, and 2 PRN visits . Also wanting VO for [facility] aide 2x week for 4 weeks. SN will also bring in telehealth equipment for weight and vitals daily." and VO have been given by PCP. RN CM confirmed with [facility] that services have been established and patient will receive PT tomorrow and a nursing visit on Wednesday.   Plan: Follow up phone call in two weeks. Will send barrier letter and initial assessment to PCP since case was previously closed.  [facility] CM Care Plan Problem One     Most Recent Value  Care Plan Problem One  high risk for readmission related to chronic health conditions including COPD, DM, recent Stroke and recurrent angioedema  Role Documenting the Problem One  Care Management Coordinator  Care Plan for Problem One  Active  [facility] Long Term Goal   Over the next 31 days, patient will not have rehospitalization as evidenced by patient reporting and review of EMR  [facility] Long Term Goal Start Date  11/14/17  Interventions for Problem One Long Term Goal  discussed with spouse and patient reason for  multiple frequent hospitalizations, discussed patients chronic conditions including COPD, discussed patients current code status and reason for palliative care services refusals, disucssed upcoming appointments and transportation, reviewed medications  [facility] CM Short Term Goal #1   Over the next 30 days, patient will take all medications as prescribed as evidenced by patient/husband reporting and review of EMR  [facility] CM Short Term Goal #1 Start Date  11/14/17  Interventions for Short Term Goal #1  verified all medications were in home husband was giving medications off most recent AVS  [facility] CM Short Term Goal #2   Over the next 30 days, patient will attend all scheduled follow-up appointments as evidenced by patient reporting and review of EMR  [facility] CM Short Term Goal #2 Start Date  11/14/17  Interventions for Short Term Goal #2  reviewed all upcoming appointments with patient and husband as noted in EPIC, obtained appointment for patient with rhumatology, discussed transportation barriers and utilization of [employer] transportation     [place] E. Rollene Rotunda RN, BSN [facility] Care Management Coordinator [phone]

## 2017-11-19 NOTE — Patient Instructions (Addendum)
Continue clopidogrel 75 mg daily  and lipitor  for secondary stroke prevention  Decrease keppra from 500mg  twice a day to 500mg  in the evening. If after 1 month, no evidence of seizure activity, please call and we will do EEG to ensure no additional seizure activity and consider decreasing keppra further at that time.  Continue to follow up with PCP regarding cholesterol, diabetes and blood pressure management   Continue to follow up with pulmonologist for continued COPD and breathing issues  Start PT/OT/ST when scheduled for continued strengthening and improvement  Continue to monitor blood pressure at home  Maintain strict control of hypertension with blood pressure goal below 130/90, diabetes with hemoglobin A1c goal below 6.5% and cholesterol with LDL cholesterol (bad cholesterol) goal below 70 mg/dL. I also advised the patient to eat a healthy diet with plenty of whole grains, cereals, fruits and vegetables, exercise regularly and maintain ideal body weight.  Followup in the future with me in 3 months or call earlier if needed       Thank you for coming to see [place] at [facility]. I hope we have been able to provide you high quality care today.  You may receive a patient satisfaction survey over the next few weeks. We would appreciate your feedback and comments so that we may continue to improve ourselves and the health of our patients.

## 2017-11-20 ENCOUNTER — Emergency Department: Payer: Medicare HMO

## 2017-11-20 ENCOUNTER — Emergency Department
Admission: EM | Admit: 2017-11-20 | Discharge: 2017-11-20 | Disposition: A | Discharge status: Home / Self Care | Payer: Medicare HMO | Source: Home / Self Care | Attending: Emergency Medicine | Admitting: Emergency Medicine

## 2017-11-20 ENCOUNTER — Ambulatory Visit (INDEPENDENT_AMBULATORY_CARE_PROVIDER_SITE_OTHER): Payer: Medicare HMO | Admitting: Internal Medicine

## 2017-11-20 ENCOUNTER — Encounter: Payer: Self-pay | Admitting: Internal Medicine

## 2017-11-20 ENCOUNTER — Other Ambulatory Visit: Payer: Self-pay

## 2017-11-20 VITALS — BP 130/82 | HR 114 | Ht 60.0 in | Wt 246.8 lb

## 2017-11-20 DIAGNOSIS — J449 Chronic obstructive pulmonary disease, unspecified: Secondary | ICD-10-CM

## 2017-11-20 DIAGNOSIS — J9601 Acute respiratory failure with hypoxia: Secondary | ICD-10-CM

## 2017-11-20 DIAGNOSIS — I130 Hypertensive heart and chronic kidney disease with heart failure and stage 1 through stage 4 chronic kidney disease, or unspecified chronic kidney disease: Secondary | ICD-10-CM | POA: Insufficient documentation

## 2017-11-20 DIAGNOSIS — E1122 Type 2 diabetes mellitus with diabetic chronic kidney disease: Secondary | ICD-10-CM | POA: Insufficient documentation

## 2017-11-20 DIAGNOSIS — R0781 Pleurodynia: Secondary | ICD-10-CM | POA: Diagnosis not present

## 2017-11-20 DIAGNOSIS — R0602 Shortness of breath: Secondary | ICD-10-CM | POA: Insufficient documentation

## 2017-11-20 DIAGNOSIS — J90 Pleural effusion, not elsewhere classified: Secondary | ICD-10-CM | POA: Diagnosis not present

## 2017-11-20 DIAGNOSIS — Z79899 Other long term (current) drug therapy: Secondary | ICD-10-CM | POA: Insufficient documentation

## 2017-11-20 DIAGNOSIS — G9340 Encephalopathy, unspecified: Secondary | ICD-10-CM | POA: Diagnosis not present

## 2017-11-20 DIAGNOSIS — Z7902 Long term (current) use of antithrombotics/antiplatelets: Secondary | ICD-10-CM

## 2017-11-20 DIAGNOSIS — M549 Dorsalgia, unspecified: Secondary | ICD-10-CM

## 2017-11-20 DIAGNOSIS — N179 Acute kidney failure, unspecified: Secondary | ICD-10-CM | POA: Diagnosis not present

## 2017-11-20 DIAGNOSIS — I2510 Atherosclerotic heart disease of native coronary artery without angina pectoris: Secondary | ICD-10-CM | POA: Diagnosis not present

## 2017-11-20 DIAGNOSIS — I129 Hypertensive chronic kidney disease with stage 1 through stage 4 chronic kidney disease, or unspecified chronic kidney disease: Secondary | ICD-10-CM | POA: Diagnosis not present

## 2017-11-20 DIAGNOSIS — M1712 Unilateral primary osteoarthritis, left knee: Secondary | ICD-10-CM | POA: Diagnosis not present

## 2017-11-20 DIAGNOSIS — J9611 Chronic respiratory failure with hypoxia: Secondary | ICD-10-CM | POA: Diagnosis not present

## 2017-11-20 DIAGNOSIS — R4182 Altered mental status, unspecified: Secondary | ICD-10-CM | POA: Diagnosis not present

## 2017-11-20 DIAGNOSIS — J9612 Chronic respiratory failure with hypercapnia: Secondary | ICD-10-CM | POA: Diagnosis not present

## 2017-11-20 DIAGNOSIS — J441 Chronic obstructive pulmonary disease with (acute) exacerbation: Secondary | ICD-10-CM | POA: Diagnosis not present

## 2017-11-20 DIAGNOSIS — Z87891 Personal history of nicotine dependence: Secondary | ICD-10-CM

## 2017-11-20 DIAGNOSIS — F3289 Other specified depressive episodes: Secondary | ICD-10-CM | POA: Diagnosis not present

## 2017-11-20 DIAGNOSIS — N183 Chronic kidney disease, stage 3 (moderate): Secondary | ICD-10-CM | POA: Insufficient documentation

## 2017-11-20 DIAGNOSIS — M79604 Pain in right leg: Secondary | ICD-10-CM | POA: Diagnosis not present

## 2017-11-20 DIAGNOSIS — I509 Heart failure, unspecified: Secondary | ICD-10-CM | POA: Insufficient documentation

## 2017-11-20 DIAGNOSIS — M25562 Pain in left knee: Secondary | ICD-10-CM | POA: Diagnosis not present

## 2017-11-20 HISTORY — DX: Heart failure, unspecified: I50.9

## 2017-11-20 LAB — FIBRIN DERIVATIVES D-DIMER (ARMC ONLY): Fibrin derivatives D-dimer (ARMC): 2158.34 ng/mL (FEU) — ABNORMAL HIGH (ref 0.00–499.00)

## 2017-11-20 LAB — CBC WITH DIFFERENTIAL/PLATELET
ABS IMMATURE GRANULOCYTES: 0.08 10*3/uL — AB (ref 0.00–0.07)
BASOS ABS: 0 10*3/uL (ref 0.0–0.1)
Basophils Relative: 0 %
EOS PCT: 1 %
Eosinophils Absolute: 0.1 10*3/uL (ref 0.0–0.5)
HCT: 35.8 % — ABNORMAL LOW (ref 36.0–46.0)
HEMOGLOBIN: 11.2 g/dL — AB (ref 12.0–15.0)
Immature Granulocytes: 1 %
LYMPHS ABS: 2 10*3/uL (ref 0.7–4.0)
LYMPHS PCT: 23 %
MCH: 27.7 pg (ref 26.0–34.0)
MCHC: 31.3 g/dL (ref 30.0–36.0)
MCV: 88.4 fL (ref 80.0–100.0)
MONOS PCT: 9 %
Monocytes Absolute: 0.8 10*3/uL (ref 0.1–1.0)
NEUTROS PCT: 66 %
NRBC: 0.6 % — AB (ref 0.0–0.2)
Neutro Abs: 5.9 10*3/uL (ref 1.7–7.7)
PLATELETS: 216 10*3/uL (ref 150–400)
RBC: 4.05 MIL/uL (ref 3.87–5.11)
RDW: 18.9 % — ABNORMAL HIGH (ref 11.5–15.5)
SMEAR REVIEW: NORMAL
WBC: 8.8 10*3/uL (ref 4.0–10.5)

## 2017-11-20 LAB — COMPREHENSIVE METABOLIC PANEL
ALBUMIN: 3.2 g/dL — AB (ref 3.5–5.0)
ALT: 31 U/L (ref 0–44)
AST: 27 U/L (ref 15–41)
Alkaline Phosphatase: 66 U/L (ref 38–126)
Anion gap: 13 (ref 5–15)
BUN: 54 mg/dL — AB (ref 8–23)
CHLORIDE: 105 mmol/L (ref 98–111)
CO2: 27 mmol/L (ref 22–32)
CREATININE: 2.43 mg/dL — AB (ref 0.44–1.00)
Calcium: 8.1 mg/dL — ABNORMAL LOW (ref 8.9–10.3)
GFR calc Af Amer: 21 mL/min — ABNORMAL LOW (ref >60)
GFR calc non Af Amer: 18 mL/min — ABNORMAL LOW (ref >60)
GLUCOSE: 144 mg/dL — AB (ref 70–99)
Potassium: 3.9 mmol/L (ref 3.5–5.1)
SODIUM: 145 mmol/L (ref 135–145)
Total Bilirubin: 0.5 mg/dL (ref 0.3–1.2)
Total Protein: 6.1 g/dL — ABNORMAL LOW (ref 6.5–8.1)

## 2017-11-20 LAB — TROPONIN I

## 2017-11-20 MED ORDER — ONDANSETRON HCL 4 MG/2ML IJ SOLN
4.0000 mg | Freq: Once | INTRAMUSCULAR | Status: AC
  Administered 2017-11-20: 4 mg via INTRAVENOUS
  Filled 2017-11-20: qty 2

## 2017-11-20 MED ORDER — TECHNETIUM TO 99M ALBUMIN AGGREGATED
4.0000 | Freq: Once | INTRAVENOUS | Status: AC | PRN
  Administered 2017-11-20: 4.294 via INTRAVENOUS

## 2017-11-20 MED ORDER — MORPHINE SULFATE (PF) 2 MG/ML IV SOLN
2.0000 mg | Freq: Once | INTRAVENOUS | Status: AC
  Administered 2017-11-20: 2 mg via INTRAVENOUS
  Filled 2017-11-20: qty 1

## 2017-11-20 MED ORDER — TECHNETIUM TC 99M DIETHYLENETRIAME-PENTAACETIC ACID
30.0000 | Freq: Once | INTRAVENOUS | Status: AC | PRN
  Administered 2017-11-20: 32 via RESPIRATORY_TRACT

## 2017-11-20 NOTE — Progress Notes (Signed)
PULMONARY OFFICE FOLLOW UP NOTE  Requesting MD/Service: [name], NP Date of initial consultation: 01/20/16 Reason for consultation:  Chronic hypoxemic respiratory failure, COPD, OHS  PT PROFILE: 77 y.o. F smoker referred for evaluation of chronic hypoxemic respiratory failure COPD, OHS.   DATA: 02/12/15 CT chest:No evidence of pulmonary embolism. No evidence of acute cardiopulmonary disease 06/21/15 spirometry: FVC: 1.82 L (85 %pred), FEV1:  1.38 L (83 %pred), FEV1/FVC: 75%   INTERVAL: Hospitalized 5/16-5/18/19 for aphasia   CC follow up SOB COPD exacerbation  HPI Patient with recent admission for angioedema And acute respiratory failure along with mental status changes  Patient subsequent show significant improvement and was discharged in follow-up here for further assessment Patient also had acute COPD exacerbation which resolved with oxygen therapy prednisone therapy and antibiotics  Patient remains on chronic supplemental oxygen therapy Patient now with acute back pain patient is increased work of breathing Patient does not look well and needs to be evaluated in the ER as soon as possible   Patient unable to provide review of systems  Vitals:   11/20/17 1600 11/20/17 1601  Pulse: (!) 114   SpO2: 90% 90%  Weight: 246 lb 12.8 oz (111.9 kg)   Height: 5' (1.524 m)   3 LPM [place] pulse    Review of Systems: Unable to provide due to illness   Physical Examination:   GENERAL: + Distress PULMONARY: Increased work of breathing no wheezing no rhonchi no crackles CARDIOVASCULAR: S1 and S2. Regular rate and rhythm. No murmurs, rubs, or gallops. No edema. Pedal pulses 2+ bilaterally.  GASTROINTESTINAL: Soft, nontender, nondistended. No masses. Positive bowel sounds. No hepatosplenomegaly.  MUSCULOSKELETAL: No swelling, clubbing, or edema. Range of motion full in all extremities.  Palpation of the back did not reveal any type of significant pain NEUROLOGIC: Unable to  assess due to confusion and disorientation sKIN: No ulceration, lesions, rashes, or cyanosis. Skin warm and dry. Turgor intact.  PSYCHIATRIC: Disoriented ALL OTHER ROS ARE NEGATIVE      CXR 07/24/17:CM, low volumes, no acute findings  IMPRESSION:   77 year old morbidly obese African-American female with multiple comorbidities including COPD and chronic hypoxic respiratory failure seen today for follow-up visit however at this time patient has increased work of breathing with acute back pain and is somnolent and disoriented  Patient was sent to the ER for further evaluation  Most likely diagnosis is acute back pain causing delirium which is causing her COPD to flareup in the setting of morbid obesity and chronic hypoxic respiratory failure.  Patient needs to be assessed for acute back pain    Corrin Parker, M.D.  [facility]  Medical Director [place] Director [facility] Cardio-Pulmonary Department

## 2017-11-20 NOTE — Progress Notes (Signed)
I agree with the above plan 

## 2017-11-20 NOTE — Patient Instructions (Signed)
Patient needs to be Assessed in ER ASAP!

## 2017-11-20 NOTE — ED Notes (Signed)
Patient transported to [facility] 

## 2017-11-20 NOTE — ED Triage Notes (Signed)
Pt to the er for sharp back pain between the ribs on the left side. Pt has never had pain like this before. Breathing is labored. Pt is on 2L at all times. Pt is anxious.

## 2017-11-20 NOTE — ED Notes (Signed)
Pt provided cup of water per patient request.

## 2017-11-20 NOTE — Discharge Instructions (Addendum)
Please follow-up with your regular doctor in the next day or 2.  Please return here if you get worse or have any other kind of pain like this again.

## 2017-11-20 NOTE — ED Provider Notes (Signed)
[facility] Emergency Department Provider Note  ____________________________________________   First MD Initiated Contact with Patient 11/20/17 1701     (approximate)  I have reviewed the triage vital signs and the nursing notes.   HISTORY  Chief Complaint Back Pain    HPI Katherine [name] is a 77 y.o. female was at her doctor and developed sharp back pain on the ribs of the left side.  She is never had anything like this.  She seemed to get short of breath.  She comes into the emergency room complaining of pain there.  VQ scan was done and was negative troponins negative patient's pain symptoms have completely resolved   Past Medical History:  Diagnosis Date  . Angioedema   . Anxiety   . Anxiety and depression   . CHF (congestive heart failure) ([place])   . Chronic constipation   . Chronic kidney disease    stage 3  . COPD (chronic obstructive pulmonary disease) ([place])   . Gallstones   . GERD (gastroesophageal reflux disease)   . Hypertension   . Left ventricular hypertrophy   . Osteoarthritis   . Prurigo nodularis   . Seizures ([place])   . Stroke ([place])   . Tobacco abuse   . Vitamin D deficiency disease     Patient Active Problem List   Diagnosis Date Noted  . 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 Diabetes mellitus type 2, uncomplicated ([place]) 24/26/8341  . Acute on chronic respiratory failure with hypoxia ([place]) 11/10/2017  . Pressure injury of skin 10/29/2017  . Sepsis ([place]) 10/24/2017  . Acute kidney injury superimposed on CKD ([place]) 10/24/2017  . Severe sepsis ([place]) 10/24/2017  . Possible Seizures ([place]) 10/08/2017  . Hyperlipemia 10/08/2017  . 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 Stroke-like episode ([place]) s/p IV tpa 10/04/2017  . Palliative care by specialist   . Elevated rheumatoid factor 09/05/2017  . Frequent hospital admissions 08/22/2017  . Aphasia 06/28/2017  . Hand pain 03/21/2017  . Dry  skin 03/21/2017  . Pain in finger of left hand 09/12/2016  . Chronic fatigue 06/12/2016  . Left knee pain 06/12/2016  . Goals of care, counseling/discussion 03/13/2016  . Primary localized osteoarthritis of right knee 02/08/2016  . OSA (obstructive sleep apnea) 09/16/2015  . Rotator cuff syndrome 09/07/2015  . 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 Sleep disturbance 04/14/2015  . Coronary artery disease 03/14/2015  . 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 Anxiety   . Tobacco abuse   . Prurigo nodularis   . GERD (gastroesophageal reflux disease)   . COPD (chronic obstructive pulmonary disease) ([place])   . Osteoarthritis   . Vitamin D deficiency disease   . Chronic constipation   . CKD (chronic kidney disease), stage III ([place])   . LVH (left ventricular hypertrophy) 05/20/2013  . Essential hypertension 05/20/2013  . Morbid obesity ([place]) 05/20/2013    Past Surgical History:  Procedure Laterality Date  . CHOLECYSTECTOMY    . 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   Procedure: TOTAL KNEE ARTHROPLASTY;  Surgeon: Hessie Knows, MD;  Location: [facility] ORS;  Service: Orthopedics;  Laterality: Right;    Prior to Admission medications   Medication Sig Start Date End Date Taking? Authorizing Provider  acetaminophen (TYLENOL) 650 MG CR tablet Take 650-1,300 mg by mouth every 8 (eight) hours as needed for pain.    [provider]  albuterol (ACCUNEB) 0.63 MG/3ML nebulizer solution Take 3 mLs by nebulization 3 (three) times daily as needed for wheezing.    [provider]  albuterol (PROVENTIL HFA;VENTOLIN HFA) 108 (90 Base) MCG/ACT inhaler Inhale 2 puffs into the lungs every 4 (four) hours as needed for wheezing or shortness of breath.    [provider]  ALPRAZolam Duanne Moron) 0.5 MG tablet Take 0.5 mg by mouth 2 (two) times daily as needed for anxiety.    [provider]  atorvastatin (LIPITOR)  80 MG tablet Take 80 mg by mouth daily.    [provider]  cetirizine (ZYRTEC) 10 MG tablet Take 5 mg by mouth at bedtime.    [provider]  cholecalciferol (VITAMIN D) 1000 units tablet Take 1,000 Units by mouth daily.    [provider]  clopidogrel (PLAVIX) 75 MG tablet Take 75 mg by mouth daily.    [provider]  diltiazem (CARDIZEM SR) 120 MG 12 hr capsule Take 120 mg by mouth 2 (two) times daily.    [provider]  docusate sodium (COLACE) 100 MG capsule Take 100 mg by mouth daily.    [provider]  FLUoxetine (PROZAC) 10 MG capsule Take 10 mg by mouth daily.    [provider]  Fluticasone-Umeclidin-Vilant 100-62.5-25 MCG/INH AEPB Inhale 1 puff into the lungs daily.    [provider]  furosemide (LASIX) 20 MG tablet Take 1 tablet (20 mg total) by mouth daily. 11/14/17   Salary, Avel Peace, MD  gabapentin (NEURONTIN) 300 MG capsule Take 300 mg by mouth at bedtime.    [provider]  guaiFENesin (MUCINEX) 600 MG 12 hr tablet Take 1 tablet (600 mg total) by mouth 2 (two) times daily. 11/13/17   Salary, Avel Peace, MD  hydrALAZINE (APRESOLINE) 25 MG tablet Take 25 mg by mouth 3 (three) times daily.    [provider]  ipratropium (ATROVENT) 0.03 % nasal spray Place 2 sprays into both nostrils every 12 (twelve) hours.    [provider]  ipratropium-albuterol (DUONEB) 0.5-2.5 (3) MG/3ML SOLN Take 3 mLs by nebulization every 6 (six) hours as needed (shortness of breath).    [provider]  levETIRAcetam (KEPPRA XR) 500 MG 24 hr tablet Take 1 tablet (500 mg total) by mouth daily. 11/19/17   Venancio Poisson, NP  meclizine (ANTIVERT) 25 MG tablet Take 25 mg by mouth 2 (two) times daily as needed for dizziness.    [provider]  methocarbamol (ROBAXIN) 500 MG tablet Take 500 mg by mouth every 8 (eight) hours as needed for muscle spasms.    [provider]    pantoprazole (PROTONIX) 40 MG tablet Take 40 mg by mouth daily.    [provider]  tiotropium (SPIRIVA) 18 MCG inhalation capsule Place 18 mcg into inhaler and inhale daily.    [provider]    Allergies Bee venom and Enalapril maleate  Family History  Problem Relation Age of Onset  . Alcohol abuse Mother   . Sickle cell anemia Daughter   . Hypertension Son   . Cancer Neg Hx   . COPD Neg Hx   . Diabetes Neg Hx   . Heart disease Neg Hx   . Stroke Neg Hx     Social History Social History   Tobacco Use  . Smoking status: Former Smoker    Packs/day: 0.50    Years: 60.00    Pack years: 30.00    Types: Cigarettes  .  Smokeless tobacco: Never Used  Substance Use Topics  . Alcohol use: No    Alcohol/week: 0.0 standard drinks    Comment: rare  . Drug use: No    Review of Systems  Constitutional: No fever/chills Eyes: No visual changes. ENT: No sore throat. Cardiovascular: Denies chest pain. Respiratory:shortness of breath. Gastrointestinal: No abdominal pain.  No nausea, no vomiting.  No diarrhea.  No constipation. Genitourinary: Negative for dysuria. Musculoskeletal: Negative for back pain. Skin: Negative for rash. Neurological: Negative for headaches, focal weakness  ____________________________________________   PHYSICAL EXAM:  VITAL SIGNS: ED Triage Vitals  Enc Vitals Group     BP 11/20/17 1630 (!) 83/50     Pulse Rate 11/20/17 1654 (!) 101     Resp 11/20/17 1630 (!) 36     Temp 11/20/17 1630 98.2 F (36.8 C)     Temp Source 11/20/17 1630 Oral     SpO2 11/20/17 1630 93 %     Weight 11/20/17 1632 264 lb (119.7 kg)     Height 11/20/17 1632 5\' 5"  (1.651 m)     Head Circumference --      Peak Flow --      Pain Score 11/20/17 1631 10     Pain Loc --      Pain Edu? --      Excl. in [place]? --     Constitutional: Alert and oriented.  In some pain Eyes: Conjunctivae are normal.  Head: Atraumatic. Nose: No  congestion/rhinnorhea. Mouth/Throat: Mucous membranes are moist.  Oropharynx non-erythematous. Neck: No stridor.   Cardiovascular: Normal rate, regular rhythm. Grossly normal heart sounds.  Good peripheral circulation. Respiratory: Normal respiratory effort.  No retractions. Lungs CTAB. Gastrointestinal: Soft and nontender. No distention. No abdominal bruits. No CVA tenderness. Musculoskeletal: No lower extremity tenderness trace bilateral edema.   Neurologic:  Normal speech and language. No gross focal neurologic deficits are appreciated. Skin:  Skin is warm, dry and intact. No rash noted. Psychiatric: Mood and affect are normal. Speech and behavior are normal.  ____________________________________________   LABS (all labs ordered are listed, but only abnormal results are displayed)  Labs Reviewed  CBC WITH DIFFERENTIAL/PLATELET - Abnormal; Notable for the following components:      Result Value   Hemoglobin 11.2 (*)    HCT 35.8 (*)    RDW 18.9 (*)    nRBC 0.6 (*)    Abs Immature Granulocytes 0.08 (*)    All other components within normal limits  COMPREHENSIVE METABOLIC PANEL - Abnormal; Notable for the following components:   Glucose, Bld 144 (*)    BUN 54 (*)    Creatinine, Ser 2.43 (*)    Calcium 8.1 (*)    Total Protein 6.1 (*)    Albumin 3.2 (*)    GFR calc non Af Amer 18 (*)    GFR calc Af Amer 21 (*)    All other components within normal limits  FIBRIN DERIVATIVES D-DIMER ([facility] ONLY) - Abnormal; Notable for the following components:   Fibrin derivatives D-dimer [facility]) 4,580.99 (*)    All other components within normal limits  TROPONIN I  URINALYSIS, COMPLETE (UACMP) WITH MICROSCOPIC   ____________________________________________  EKG Interpreted by me shows sinus tachycardia rate of 106 left axis no acute ST-T wave changes  ____________________________________________  RADIOLOGY  ED MD interpretation: Test x-ray read by radiology reviewed by me shows mild  right atelectasis or scarring VQ scan read by the radiology reviewed by me shows a matched defect low probability  scan  Official radiology report(s): Nm Pulmonary Vent And Perf (v/q Scan)  Result Date: 11/20/2017 CLINICAL DATA:  Pleural effusion. Fall. Upper back pain radiating to the left side EXAM: NUCLEAR MEDICINE VENTILATION - PERFUSION LUNG SCAN TECHNIQUE: Ventilation images were obtained in multiple projections using inhaled aerosol Tc-30m DTPA. Perfusion images were obtained in multiple projections after intravenous injection of Tc-72m-MAA. RADIOPHARMACEUTICALS:  Thirty-two mCi of Tc-32m DTPA aerosol inhalation and 4.3 mCi Tc52m-MAA IV COMPARISON:  Chest radiograph 11/20/2017 FINDINGS: Ventilation: Considerable aerosol deposition along the tracheobronchial tree. Reduced activity posteriorly in both lower lobes. Perfusion: Reduced activity posteriorly in both lower lobes on the lateral projections, matching findings on the ventilation portion of the exam. The area of involvement is small. IMPRESSION: 1. Small segmental matched reduced perfusion and ventilation activity posteriorly in both lower lobes. Appearance reflects low probability of pulmonary embolus (10 - 19% risk) based on PIOPED 2 criteria. Electronically Signed   By: Van Clines M.D.   On: 11/20/2017 20:50   Dg Chest Portable 1 View  Result Date: 11/20/2017 CLINICAL DATA:  Back pain. EXAM: PORTABLE CHEST 1 VIEW COMPARISON:  Radiographs of November 10, 2017. FINDINGS: Stable cardiomediastinal silhouette. No pneumothorax or pleural effusion is noted. Atherosclerosis of thoracic aorta is noted. Left lung is clear. Mild right basilar subsegmental atelectasis or scarring is noted. Bony thorax is unremarkable. IMPRESSION: Mild right basilar subsegmental atelectasis or scarring. Aortic Atherosclerosis (ICD10-I70.0). Electronically Signed   By: Marijo Conception, M.D.   On: 11/20/2017 17:32     ____________________________________________   PROCEDURES  Procedure(s) performed:   Procedures  Critical Care performed:   ____________________________________________   INITIAL IMPRESSION / ASSESSMENT AND PLAN / ED COURSE  Patient is back to baseline she is not short of breath she is not having any further pain low probability VQ scan I cannot do a CT angios because of her creatinine GFR status because her symptoms are completely resolved and she is at low probability I will let her go.  She will return immediately if she is worse.         ____________________________________________   FINAL CLINICAL IMPRESSION(S) / ED DIAGNOSES  Final diagnoses:  Acute left-sided back pain, unspecified back location     ED Discharge Orders    None       Note:  This document was prepared using Dragon voice recognition software and may include unintentional dictation errors.    Nena Polio, MD 11/20/17 2224

## 2017-11-21 ENCOUNTER — Emergency Department: Payer: Medicare HMO

## 2017-11-21 ENCOUNTER — Other Ambulatory Visit: Payer: Self-pay | Admitting: Unknown Physician Specialty

## 2017-11-21 ENCOUNTER — Other Ambulatory Visit: Payer: Self-pay

## 2017-11-21 ENCOUNTER — Inpatient Hospital Stay
Admission: EM | Admit: 2017-11-21 | Discharge: 2017-11-26 | DRG: 682 | Disposition: A | Discharge status: Skilled Nursing Facility | Payer: Medicare HMO | Attending: Internal Medicine | Admitting: Internal Medicine

## 2017-11-21 DIAGNOSIS — I2510 Atherosclerotic heart disease of native coronary artery without angina pectoris: Secondary | ICD-10-CM | POA: Diagnosis not present

## 2017-11-21 DIAGNOSIS — I129 Hypertensive chronic kidney disease with stage 1 through stage 4 chronic kidney disease, or unspecified chronic kidney disease: Secondary | ICD-10-CM | POA: Diagnosis not present

## 2017-11-21 DIAGNOSIS — N390 Urinary tract infection, site not specified: Secondary | ICD-10-CM | POA: Diagnosis present

## 2017-11-21 DIAGNOSIS — J9612 Chronic respiratory failure with hypercapnia: Secondary | ICD-10-CM | POA: Diagnosis not present

## 2017-11-21 DIAGNOSIS — E785 Hyperlipidemia, unspecified: Secondary | ICD-10-CM | POA: Diagnosis present

## 2017-11-21 DIAGNOSIS — G629 Polyneuropathy, unspecified: Secondary | ICD-10-CM | POA: Diagnosis present

## 2017-11-21 DIAGNOSIS — N179 Acute kidney failure, unspecified: Principal | ICD-10-CM | POA: Diagnosis present

## 2017-11-21 DIAGNOSIS — J441 Chronic obstructive pulmonary disease with (acute) exacerbation: Secondary | ICD-10-CM | POA: Diagnosis present

## 2017-11-21 DIAGNOSIS — R4182 Altered mental status, unspecified: Secondary | ICD-10-CM | POA: Diagnosis not present

## 2017-11-21 DIAGNOSIS — R200 Anesthesia of skin: Secondary | ICD-10-CM | POA: Diagnosis not present

## 2017-11-21 DIAGNOSIS — Z6841 Body Mass Index (BMI) 40.0 and over, adult: Secondary | ICD-10-CM

## 2017-11-21 DIAGNOSIS — I5032 Chronic diastolic (congestive) heart failure: Secondary | ICD-10-CM | POA: Diagnosis present

## 2017-11-21 DIAGNOSIS — K219 Gastro-esophageal reflux disease without esophagitis: Secondary | ICD-10-CM | POA: Diagnosis present

## 2017-11-21 DIAGNOSIS — E1122 Type 2 diabetes mellitus with diabetic chronic kidney disease: Secondary | ICD-10-CM | POA: Diagnosis not present

## 2017-11-21 DIAGNOSIS — Z888 Allergy status to other drugs, medicaments and biological substances status: Secondary | ICD-10-CM

## 2017-11-21 DIAGNOSIS — Z9981 Dependence on supplemental oxygen: Secondary | ICD-10-CM

## 2017-11-21 DIAGNOSIS — M25562 Pain in left knee: Secondary | ICD-10-CM | POA: Diagnosis not present

## 2017-11-21 DIAGNOSIS — N183 Chronic kidney disease, stage 3 (moderate): Secondary | ICD-10-CM | POA: Diagnosis present

## 2017-11-21 DIAGNOSIS — M1712 Unilateral primary osteoarthritis, left knee: Secondary | ICD-10-CM | POA: Diagnosis not present

## 2017-11-21 DIAGNOSIS — Z7951 Long term (current) use of inhaled steroids: Secondary | ICD-10-CM

## 2017-11-21 DIAGNOSIS — Z96651 Presence of right artificial knee joint: Secondary | ICD-10-CM | POA: Diagnosis present

## 2017-11-21 DIAGNOSIS — Z79899 Other long term (current) drug therapy: Secondary | ICD-10-CM

## 2017-11-21 DIAGNOSIS — Z8673 Personal history of transient ischemic attack (TIA), and cerebral infarction without residual deficits: Secondary | ICD-10-CM

## 2017-11-21 DIAGNOSIS — Z7902 Long term (current) use of antithrombotics/antiplatelets: Secondary | ICD-10-CM

## 2017-11-21 DIAGNOSIS — I130 Hypertensive heart and chronic kidney disease with heart failure and stage 1 through stage 4 chronic kidney disease, or unspecified chronic kidney disease: Secondary | ICD-10-CM | POA: Diagnosis present

## 2017-11-21 DIAGNOSIS — R0603 Acute respiratory distress: Secondary | ICD-10-CM | POA: Diagnosis present

## 2017-11-21 DIAGNOSIS — Z87891 Personal history of nicotine dependence: Secondary | ICD-10-CM

## 2017-11-21 DIAGNOSIS — M79604 Pain in right leg: Secondary | ICD-10-CM | POA: Diagnosis not present

## 2017-11-21 DIAGNOSIS — J9620 Acute and chronic respiratory failure, unspecified whether with hypoxia or hypercapnia: Secondary | ICD-10-CM | POA: Diagnosis not present

## 2017-11-21 DIAGNOSIS — F3289 Other specified depressive episodes: Secondary | ICD-10-CM | POA: Diagnosis not present

## 2017-11-21 DIAGNOSIS — J9611 Chronic respiratory failure with hypoxia: Secondary | ICD-10-CM | POA: Diagnosis not present

## 2017-11-21 LAB — COMPREHENSIVE METABOLIC PANEL
ALBUMIN: 3 g/dL — AB (ref 3.5–5.0)
ALT: 27 U/L (ref 0–44)
ANION GAP: 8 (ref 5–15)
AST: 18 U/L (ref 15–41)
Alkaline Phosphatase: 62 U/L (ref 38–126)
BUN: 46 mg/dL — ABNORMAL HIGH (ref 8–23)
CO2: 32 mmol/L (ref 22–32)
Calcium: 7.8 mg/dL — ABNORMAL LOW (ref 8.9–10.3)
Chloride: 104 mmol/L (ref 98–111)
Creatinine, Ser: 2.42 mg/dL — ABNORMAL HIGH (ref 0.44–1.00)
GFR calc Af Amer: 21 mL/min — ABNORMAL LOW (ref >60)
GFR calc non Af Amer: 18 mL/min — ABNORMAL LOW (ref >60)
GLUCOSE: 161 mg/dL — AB (ref 70–99)
POTASSIUM: 4.1 mmol/L (ref 3.5–5.1)
SODIUM: 144 mmol/L (ref 135–145)
TOTAL PROTEIN: 5.9 g/dL — AB (ref 6.5–8.1)
Total Bilirubin: 0.3 mg/dL (ref 0.3–1.2)

## 2017-11-21 LAB — CBC
HCT: 33.8 % — ABNORMAL LOW (ref 36.0–46.0)
HEMOGLOBIN: 10.3 g/dL — AB (ref 12.0–15.0)
MCH: 27.6 pg (ref 26.0–34.0)
MCHC: 30.5 g/dL (ref 30.0–36.0)
MCV: 90.6 fL (ref 80.0–100.0)
NRBC: 0.8 % — AB (ref 0.0–0.2)
Platelets: 200 10*3/uL (ref 150–400)
RBC: 3.73 MIL/uL — AB (ref 3.87–5.11)
RDW: 18.8 % — ABNORMAL HIGH (ref 11.5–15.5)
WBC: 7.5 10*3/uL (ref 4.0–10.5)

## 2017-11-21 MED ORDER — OXYCODONE-ACETAMINOPHEN 5-325 MG PO TABS
1.0000 | ORAL_TABLET | Freq: Once | ORAL | Status: AC
  Administered 2017-11-21: 1 via ORAL
  Filled 2017-11-21: qty 1

## 2017-11-21 NOTE — ED Triage Notes (Signed)
Per EMS, arrived at pt's home to find pt confused with altered mental status.  When pt stood, EMS noticed a foul odor (similar to a UTI).  En route, pt knew her name and the president.  BP dropped and fluids were initiated.  Blood glucose 218.  HR normal.  Pt usually on 2L O2 but sats for EMS were 88%, so O2 was increased to 4L.  Seen in ED last PM for back pain.

## 2017-11-21 NOTE — Telephone Encounter (Signed)
Requested medication (s) are due for refill today: "Historical med"  Requested medication (s) are on the active medication list: yes  Last refill:  Started 06/25/17 and end 07/16/17    Future visit scheduled: yes  Notes to clinic:  Note attached to med "discontinued by provider"   Requested Prescriptions  Pending Prescriptions Disp [place] 18 MCG inhalation capsule [Pharmacy Med Name: SPIRIVA 18MCG CAPS 30S &HANDIHALER] 30 capsule 0    Sig: INHALE THE CONTENTS OF 1 CAPSULE VIA INHALATION DEVICE EVERY DAY     Pulmonology:  Anticholinergic Agents Passed - 11/21/2017 10:32 AM      Passed - Valid encounter within last 12 months    Recent Outpatient Visits          5 days ago Hospital discharge follow-up   [facility] [name], NP   2 weeks ago Interstitial pulmonary disease ([place])   George, Megan P, DO   1 month ago Chronic obstructive pulmonary disease, unspecified COPD type ([place])   Mart, Megan P, DO   2 months ago Chronic obstructive pulmonary disease, unspecified COPD type ([place])   [place] [name], NP   3 months ago Pain of left hand   [facility] [name], NP      Future Appointments            In 3 weeks Wynetta Emery, Barb Merino, DO [employer]

## 2017-11-21 NOTE — ED Provider Notes (Addendum)
[facility] Emergency Department Provider Note  ____________________________________________   I have reviewed the triage vital signs and the nursing notes. Where available I have reviewed prior notes and, if possible and indicated, outside hospital notes.    HISTORY  Chief Complaint Altered Mental Status    HPI Katherine [name] is a 77 y.o. female  With a history of anxiety angioedema, CHF chronic constipation chronic kidney disease COPD gallstones reflux disease obesity and multiple other medical problems, has been seen very frequently in this emergency room most of the last night when she was having some side pain which is now resolved.  She had a extensive work-up for that which was negative.  Has been completely gone she did fine all day and then today she was going down to go the bathroom had some cramping pain in both of her legs, and had to sit down.  Did not fall did not hit her head.  She states she does not have any pain now unless she moves her legs the wrong way.  She is very difficult historian.  She refuses to really tell me where it hurts.  She says "I told you my legs hurt and that all you need to know and you are to asking too many questions".  No history of PE or DVT, legs are chronically swollen they do not appear to be different to her her family.  She cannot specify any particular part of her legs and her.  She denies any numbness or focal weakness she states she feels weak all over when the pain comes right now she is not actually having any pain unless I touch it or move her legs.  No fall or trauma pain no shortness of breath no incontinence of bowel or bladder.  Patient has extensive multiple different recent work-ups including 3 reassuring CT scans of her chest this year, 3 reassuring CT scans of her head this year, CT angiogram which was reassuring, reassuring MRI of the head in August as well as in May, 2 different VQ scan which were negative this  year, negative right-sided DVT imaging in September of this year for leg pain etc.  In short, patient has had many complaints but very few findings on extensive imaging in the last 12 months.    Past Medical History:  Diagnosis Date  . Angioedema   . Anxiety   . Anxiety and depression   . CHF (congestive heart failure) ([place])   . Chronic constipation   . Chronic kidney disease    stage 3  . COPD (chronic obstructive pulmonary disease) ([place])   . Gallstones   . GERD (gastroesophageal reflux disease)   . Hypertension   . Left ventricular hypertrophy   . Osteoarthritis   . Prurigo nodularis   . Seizures ([place])   . Stroke ([place])   . Tobacco abuse   . Vitamin D deficiency disease     Patient Active Problem List   Diagnosis Date Noted  . 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[place]) 11/16/2017  . Diabetes mellitus type 2, uncomplicated ([place]) 78/29/5621  . Acute on chronic respiratory failure with hypoxia ([place]) 11/10/2017  . Pressure injury of skin 10/29/2017  . Sepsis ([place]) 10/24/2017  . Acute kidney injury superimposed on CKD ([place]) 10/24/2017  . Severe sepsis ([place]) 10/24/2017  . Possible Seizures ([place]) 10/08/2017  . Hyperlipemia 10/08/2017  . 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 Stroke-like episode ([place]) s/p IV tpa 10/04/2017  . Palliative care  by specialist   . Elevated rheumatoid factor 09/05/2017  . Frequent hospital admissions 08/22/2017  . Aphasia 06/28/2017  . Hand pain 03/21/2017  . Dry skin 03/21/2017  . Pain in finger of left hand 09/12/2016  . Chronic fatigue 06/12/2016  . Left knee pain 06/12/2016  . Goals of care, counseling/discussion 03/13/2016  . Primary localized osteoarthritis of right knee 02/08/2016  . OSA (obstructive sleep apnea) 09/16/2015  . Rotator cuff syndrome 09/07/2015  . 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 Sleep disturbance 04/14/2015  . Coronary artery disease 03/14/2015  . 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 Lichen simplex  chronicus 08/12/2014  . Anxiety   . Tobacco abuse   . Prurigo nodularis   . GERD (gastroesophageal reflux disease)   . COPD (chronic obstructive pulmonary disease) ([place])   . Osteoarthritis   . Vitamin D deficiency disease   . Chronic constipation   . CKD (chronic kidney disease), stage III ([place])   . LVH (left ventricular hypertrophy) 05/20/2013  . Essential hypertension 05/20/2013  . Morbid obesity ([place]) 05/20/2013    Past Surgical History:  Procedure Laterality Date  . CHOLECYSTECTOMY    . 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   Procedure: TOTAL KNEE ARTHROPLASTY;  Surgeon: Hessie Knows, MD;  Location: [facility] ORS;  Service: Orthopedics;  Laterality: Right;    Prior to Admission medications   Medication Sig Start Date End Date Taking? Authorizing Provider  acetaminophen (TYLENOL) 650 MG CR tablet Take 650-1,300 mg by mouth every 8 (eight) hours as needed for pain.    [provider]  albuterol (ACCUNEB) 0.63 MG/3ML nebulizer solution Take 3 mLs by nebulization 3 (three) times daily as needed for wheezing.    [provider]  albuterol (PROVENTIL HFA;VENTOLIN HFA) 108 (90 Base) MCG/ACT inhaler Inhale 2 puffs into the lungs every 4 (four) hours as needed for wheezing or shortness of breath.    [provider]  ALPRAZolam Duanne Moron) 0.5 MG tablet Take 0.5 mg by mouth 2 (two) times daily as needed for anxiety.    [provider]  atorvastatin (LIPITOR) 80 MG tablet Take 80 mg by mouth daily.    [provider]  cetirizine (ZYRTEC) 10 MG tablet Take 5 mg by mouth at bedtime.    [provider]  cholecalciferol (VITAMIN D) 1000 units tablet Take 1,000 Units by mouth daily.    [provider]  clopidogrel (PLAVIX) 75 MG tablet Take 75 mg by mouth daily.    [provider]  diltiazem (CARDIZEM SR) 120 MG 12 hr capsule Take 120 mg by mouth 2 (two) times daily.    [provider]  docusate sodium (COLACE) 100 MG capsule Take 100 mg by mouth daily.    [provider]  FLUoxetine (PROZAC) 10 MG capsule Take 10 mg by mouth daily.    [provider]  Fluticasone-Umeclidin-Vilant 100-62.5-25 MCG/INH AEPB Inhale 1 puff into the lungs daily.    [provider]  furosemide (LASIX) 20 MG tablet Take 1 tablet (20 mg total) by mouth daily. 11/14/17   Salary, Avel Peace, MD  gabapentin (NEURONTIN) 300 MG capsule Take 300 mg by mouth at bedtime.    [provider]  guaiFENesin (MUCINEX) 600 MG 12 hr tablet Take 1 tablet (600 mg total) by mouth 2 (two) times daily. 11/13/17   Salary, Avel Peace, MD  hydrALAZINE (APRESOLINE) 25 MG tablet Take 25 mg by mouth 3 (three) times daily.  [provider]  ipratropium (ATROVENT) 0.03 % nasal spray Place 2 sprays into both nostrils every 12 (twelve) hours.    [provider]  ipratropium-albuterol (DUONEB) 0.5-2.5 (3) MG/3ML SOLN Take 3 mLs by nebulization every 6 (six) hours as needed (shortness of breath).    [provider]  levETIRAcetam (KEPPRA XR) 500 MG 24 hr tablet Take 1 tablet (500 mg total) by mouth daily. 11/19/17   Venancio Poisson, NP  meclizine (ANTIVERT) 25 MG tablet Take 25 mg by mouth 2 (two) times daily as needed for dizziness.    [provider]  methocarbamol (ROBAXIN) 500 MG tablet Take 500 mg by mouth every 8 (eight) hours as needed for muscle spasms.    [provider]  pantoprazole (PROTONIX) 40 MG tablet Take 40 mg by mouth daily.    [provider]  tiotropium (SPIRIVA) 18 MCG inhalation capsule Place 18 mcg into inhaler and inhale daily.    [provider]    Allergies Bee venom and Enalapril maleate  Family History  Problem Relation Age of Onset  . Alcohol abuse Mother   . Sickle cell anemia Daughter   . Hypertension Son   . Cancer Neg Hx   . COPD Neg Hx   . Diabetes Neg Hx   . Heart disease Neg Hx   . Stroke  Neg Hx     Social History Social History   Tobacco Use  . Smoking status: Former Smoker    Packs/day: 0.50    Years: 60.00    Pack years: 30.00    Types: Cigarettes  . Smokeless tobacco: Never Used  Substance Use Topics  . Alcohol use: No    Alcohol/week: 0.0 standard drinks    Comment: rare  . Drug use: No    Review of Systems Constitutional: No fever/chills Eyes: No visual changes. ENT: No sore throat. No stiff neck no neck pain Cardiovascular: Denies chest pain. Respiratory: Denies shortness of breath. Gastrointestinal:   no vomiting.  No diarrhea.  No constipation. Genitourinary: Negative for dysuria. Musculoskeletal: Negative lower extremity swelling Skin: Negative for rash. Neurological: Negative for severe headaches, focal weakness or numbness.   ____________________________________________   PHYSICAL EXAM:  VITAL SIGNS: ED Triage Vitals  Enc Vitals Group     BP 11/21/17 1934 (!) 93/58     Pulse Rate 11/21/17 1934 96     Resp 11/21/17 1934 (!) 21     Temp 11/21/17 1934 98.3 F (36.8 C)     Temp Source 11/21/17 1934 Oral     SpO2 11/21/17 1934 96 %     Weight 11/21/17 1942 250 lb (113.4 kg)     Height 11/21/17 1942 5\' 6"  (1.676 m)     Head Circumference --      Peak Flow --      Pain Score 11/21/17 1938 0     Pain Loc --      Pain Edu? --      Excl. in [place]? --     Constitutional: Alert and oriented. Well appearing and in no acute distress.  And resting comfortably in the bed when I touch anywhere on any of her legs she starts to yell she came in yelling by ambulance but is calm down now.  Family states that that is how she gets when she is upset Eyes: Conjunctivae are normal Head: Atraumatic HEENT: No congestion/rhinnorhea. Mucous membranes are moist.  Oropharynx non-erythematous Neck:   Nontender with no meningismus, no masses, no stridor  Cardiovascular: Normal rate, regular rhythm. Grossly normal heart sounds.  Good peripheral  circulation. Respiratory: Normal respiratory effort.  No retractions. Lungs CTAB. Abdominal: Soft and nontender. No distention. No guarding no rebound Back:  There is no focal tenderness or step off.  there is no midline tenderness there are no lesions noted. there is no CVA tenderness  Musculoskeletal: Mild left greater than right chronic appearing edema.  No other DVT signs.  She has minimal tenderness palpation of the left knee.  She tells me anywhere I touch her legs anywhere so is very difficult to get a good exam but she has good pulses and does not appear to have PVD or compartment syndrome.  There is no evidence of sciatica by history and she has no low back pain.  Legs are normal in appearance, she yells when we try to move them but she will not tell me where they hurt Neurologic:  Normal speech and language. No gross focal neurologic deficits are appreciated.  Skin:  Skin is warm, dry and intact. No rash noted. Psychiatric: Mood and affect are normal. Speech and behavior are normal.  ____________________________________________   LABS (all labs ordered are listed, but only abnormal results are displayed)  Labs Reviewed  COMPREHENSIVE METABOLIC PANEL - Abnormal; Notable for the following components:      Result Value   Glucose, Bld 161 (*)    BUN 46 (*)    Creatinine, Ser 2.42 (*)    Calcium 7.8 (*)    Total Protein 5.9 (*)    Albumin 3.0 (*)    GFR calc non Af Amer 18 (*)    GFR calc Af Amer 21 (*)    All other components within normal limits  CBC - Abnormal; Notable for the following components:   RBC 3.73 (*)    Hemoglobin 10.3 (*)    HCT 33.8 (*)    RDW 18.8 (*)    nRBC 0.8 (*)    All other components within normal limits  CBG MONITORING, ED    Pertinent labs  results that were available during my care of the patient were reviewed by me and considered in my medical decision making (see chart for details). ____________________________________________  EKG  I  personally interpreted any EKGs ordered by me or triage  ____________________________________________  RADIOLOGY  Pertinent labs & imaging results that were available during my care of the patient were reviewed by me and considered in my medical decision making (see chart for details). If possible, patient and/or family made aware of any abnormal findings.  No results found. ____________________________________________    PROCEDURES  Procedure(s) performed: None  Procedures  Critical Care performed: None  ____________________________________________   INITIAL IMPRESSION / ASSESSMENT AND PLAN / ED COURSE  Pertinent labs & imaging results that were available during my care of the patient were reviewed by me and considered in my medical decision making (see chart for details).  Patient complaining of "leg pain", as noted above extensive work-up for multiple different problems in the last year but none of which seems to have yielded much in the way of positive findings, while this does not mean that she could not have something going on today, it does mean that patient does tend towards symptoms that are not verified by conventional means in terms of acute pathology.  In any event, patient is resting comfortably watching TV and relaxing on her home oxygen whenever we about her legs she gets upset.  I have given her a  Percocet and not sure if that is why she is here, she does seem to stay at this time that she has slightly increased pain in the left knee which is chronic but we will get an x-ray at her request, there is been no trauma or fall.  Is very difficult to get a good neurologic exam on her because she declines to really try to move her legs very much but there is no obvious evidence of focality to her exam.  Nothing at this time to suggest cauda equina syndrome certainly no saddle anesthesia.  Is declines rectal exam.  We will obtain ultrasound as there is a disparity in size between  the leg and the other although it appears chronic to make sure she does not have a DVT, will obtain x-ray, we also do basic blood work which thus far is reassuring, no white count, vital signs are noted.  A host of different things could be causing her symptoms but I do not see any particular compelling pathology noted today and again this is a completely different pain complaint than what brought her in yesterday.  ----------------------------------------- 11:02 PM on 11/21/2017 -----------------------------------------  Signed out to dr. Owens Shark at the end of my shift.    ____________________________________________   FINAL CLINICAL IMPRESSION(S) / ED DIAGNOSES  Final diagnoses:  None      This chart was dictated using voice recognition software.  Despite best efforts to proofread,  errors can occur which can change meaning.      Schuyler Amor, MD 11/21/17 2240    Schuyler Amor, MD 11/21/17 2303

## 2017-11-21 NOTE — ED Notes (Signed)
Family at bedside. 

## 2017-11-21 NOTE — ED Notes (Signed)
Patient is resting comfortably. 

## 2017-11-21 NOTE — ED Notes (Signed)
Report given to Kelly, RN.

## 2017-11-22 ENCOUNTER — Inpatient Hospital Stay: Payer: Medicare HMO

## 2017-11-22 ENCOUNTER — Other Ambulatory Visit: Payer: Self-pay

## 2017-11-22 ENCOUNTER — Inpatient Hospital Stay: Payer: Medicare HMO | Admitting: Family Medicine

## 2017-11-22 ENCOUNTER — Encounter: Payer: Self-pay | Admitting: Internal Medicine

## 2017-11-22 DIAGNOSIS — N390 Urinary tract infection, site not specified: Secondary | ICD-10-CM | POA: Diagnosis present

## 2017-11-22 DIAGNOSIS — R739 Hyperglycemia, unspecified: Secondary | ICD-10-CM | POA: Diagnosis not present

## 2017-11-22 DIAGNOSIS — N179 Acute kidney failure, unspecified: Secondary | ICD-10-CM | POA: Diagnosis not present

## 2017-11-22 DIAGNOSIS — M79604 Pain in right leg: Secondary | ICD-10-CM | POA: Diagnosis not present

## 2017-11-22 DIAGNOSIS — I5032 Chronic diastolic (congestive) heart failure: Secondary | ICD-10-CM | POA: Diagnosis not present

## 2017-11-22 DIAGNOSIS — J80 Acute respiratory distress syndrome: Secondary | ICD-10-CM | POA: Diagnosis not present

## 2017-11-22 DIAGNOSIS — Z8673 Personal history of transient ischemic attack (TIA), and cerebral infarction without residual deficits: Secondary | ICD-10-CM | POA: Diagnosis not present

## 2017-11-22 DIAGNOSIS — Z888 Allergy status to other drugs, medicaments and biological substances status: Secondary | ICD-10-CM | POA: Diagnosis not present

## 2017-11-22 DIAGNOSIS — F3289 Other specified depressive episodes: Secondary | ICD-10-CM | POA: Diagnosis not present

## 2017-11-22 DIAGNOSIS — E7849 Other hyperlipidemia: Secondary | ICD-10-CM | POA: Diagnosis not present

## 2017-11-22 DIAGNOSIS — Z87891 Personal history of nicotine dependence: Secondary | ICD-10-CM | POA: Diagnosis not present

## 2017-11-22 DIAGNOSIS — R0603 Acute respiratory distress: Secondary | ICD-10-CM | POA: Diagnosis present

## 2017-11-22 DIAGNOSIS — Z9981 Dependence on supplemental oxygen: Secondary | ICD-10-CM | POA: Diagnosis not present

## 2017-11-22 DIAGNOSIS — Z9911 Dependence on respirator [ventilator] status: Secondary | ICD-10-CM | POA: Diagnosis not present

## 2017-11-22 DIAGNOSIS — M6281 Muscle weakness (generalized): Secondary | ICD-10-CM | POA: Diagnosis not present

## 2017-11-22 DIAGNOSIS — I2510 Atherosclerotic heart disease of native coronary artery without angina pectoris: Secondary | ICD-10-CM | POA: Diagnosis not present

## 2017-11-22 DIAGNOSIS — Z79899 Other long term (current) drug therapy: Secondary | ICD-10-CM | POA: Diagnosis not present

## 2017-11-22 DIAGNOSIS — J9621 Acute and chronic respiratory failure with hypoxia: Secondary | ICD-10-CM | POA: Diagnosis not present

## 2017-11-22 DIAGNOSIS — K219 Gastro-esophageal reflux disease without esophagitis: Secondary | ICD-10-CM | POA: Diagnosis not present

## 2017-11-22 DIAGNOSIS — F064 Anxiety disorder due to known physiological condition: Secondary | ICD-10-CM | POA: Diagnosis not present

## 2017-11-22 DIAGNOSIS — E1121 Type 2 diabetes mellitus with diabetic nephropathy: Secondary | ICD-10-CM | POA: Diagnosis not present

## 2017-11-22 DIAGNOSIS — R0602 Shortness of breath: Secondary | ICD-10-CM | POA: Diagnosis not present

## 2017-11-22 DIAGNOSIS — J449 Chronic obstructive pulmonary disease, unspecified: Secondary | ICD-10-CM | POA: Diagnosis not present

## 2017-11-22 DIAGNOSIS — J9620 Acute and chronic respiratory failure, unspecified whether with hypoxia or hypercapnia: Secondary | ICD-10-CM | POA: Diagnosis not present

## 2017-11-22 DIAGNOSIS — Z96651 Presence of right artificial knee joint: Secondary | ICD-10-CM | POA: Diagnosis present

## 2017-11-22 DIAGNOSIS — G629 Polyneuropathy, unspecified: Secondary | ICD-10-CM | POA: Diagnosis present

## 2017-11-22 DIAGNOSIS — N183 Chronic kidney disease, stage 3 (moderate): Secondary | ICD-10-CM | POA: Diagnosis not present

## 2017-11-22 DIAGNOSIS — I130 Hypertensive heart and chronic kidney disease with heart failure and stage 1 through stage 4 chronic kidney disease, or unspecified chronic kidney disease: Secondary | ICD-10-CM | POA: Diagnosis not present

## 2017-11-22 DIAGNOSIS — Z6841 Body Mass Index (BMI) 40.0 and over, adult: Secondary | ICD-10-CM | POA: Diagnosis not present

## 2017-11-22 DIAGNOSIS — Z7902 Long term (current) use of antithrombotics/antiplatelets: Secondary | ICD-10-CM | POA: Diagnosis not present

## 2017-11-22 DIAGNOSIS — J441 Chronic obstructive pulmonary disease with (acute) exacerbation: Secondary | ICD-10-CM | POA: Diagnosis not present

## 2017-11-22 DIAGNOSIS — I10 Essential (primary) hypertension: Secondary | ICD-10-CM | POA: Diagnosis not present

## 2017-11-22 DIAGNOSIS — E785 Hyperlipidemia, unspecified: Secondary | ICD-10-CM | POA: Diagnosis present

## 2017-11-22 DIAGNOSIS — Z7951 Long term (current) use of inhaled steroids: Secondary | ICD-10-CM | POA: Diagnosis not present

## 2017-11-22 DIAGNOSIS — M25562 Pain in left knee: Secondary | ICD-10-CM | POA: Diagnosis not present

## 2017-11-22 HISTORY — DX: Urinary tract infection, site not specified: N39.0

## 2017-11-22 LAB — URINALYSIS, COMPLETE (UACMP) WITH MICROSCOPIC
BILIRUBIN URINE: NEGATIVE
Glucose, UA: NEGATIVE mg/dL
KETONES UR: NEGATIVE mg/dL
NITRITE: NEGATIVE
PH: 5 (ref 5.0–8.0)
Protein, ur: NEGATIVE mg/dL
RBC / HPF: >50 RBC/hpf — ABNORMAL HIGH (ref 0–5)
Specific Gravity, Urine: 1.013 (ref 1.005–1.030)

## 2017-11-22 LAB — NA AND K (SODIUM & POTASSIUM), RAND UR
Potassium Urine: 19 mmol/L
SODIUM UR: 35 mmol/L

## 2017-11-22 LAB — GLUCOSE, CAPILLARY
GLUCOSE-CAPILLARY: 112 mg/dL — AB (ref 70–99)
GLUCOSE-CAPILLARY: 166 mg/dL — AB (ref 70–99)
GLUCOSE-CAPILLARY: 101 mg/dL — AB (ref 70–99)
Glucose-Capillary: 141 mg/dL — ABNORMAL HIGH (ref 70–99)

## 2017-11-22 LAB — PROTEIN, URINE, RANDOM: Total Protein, Urine: 28 mg/dL

## 2017-11-22 LAB — PREALBUMIN: PREALBUMIN: 32.1 mg/dL (ref 18–38)

## 2017-11-22 LAB — CREATININE, URINE, RANDOM: Creatinine, Urine: 144 mg/dL

## 2017-11-22 LAB — MAGNESIUM: MAGNESIUM: 1.5 mg/dL — AB (ref 1.7–2.4)

## 2017-11-22 LAB — PHOSPHORUS: PHOSPHORUS: 4 mg/dL (ref 2.5–4.6)

## 2017-11-22 MED ORDER — LEVETIRACETAM ER 500 MG PO TB24
500.0000 mg | ORAL_TABLET | Freq: Every day | ORAL | Status: DC
  Administered 2017-11-22 – 2017-11-26 (×5): 500 mg via ORAL
  Filled 2017-11-22 (×5): qty 1

## 2017-11-22 MED ORDER — GABAPENTIN 300 MG PO CAPS
300.0000 mg | ORAL_CAPSULE | Freq: Every day | ORAL | Status: DC
  Administered 2017-11-22: 300 mg via ORAL
  Filled 2017-11-22: qty 1

## 2017-11-22 MED ORDER — METHOCARBAMOL 500 MG PO TABS: 500.0000 mg | ORAL_TABLET | Freq: Three times a day (TID) | ORAL | Status: DC | PRN

## 2017-11-22 MED ORDER — DILTIAZEM HCL ER 60 MG PO CP12
120.0000 mg | ORAL_CAPSULE | Freq: Two times a day (BID) | ORAL | Status: DC
  Administered 2017-11-22 – 2017-11-26 (×9): 120 mg via ORAL
  Filled 2017-11-22 (×10): qty 2

## 2017-11-22 MED ORDER — HYDRALAZINE HCL 25 MG PO TABS
25.0000 mg | ORAL_TABLET | Freq: Three times a day (TID) | ORAL | Status: DC
  Administered 2017-11-22 – 2017-11-26 (×11): 25 mg via ORAL
  Filled 2017-11-22 (×11): qty 1

## 2017-11-22 MED ORDER — SENNOSIDES-DOCUSATE SODIUM 8.6-50 MG PO TABS
1.0000 | ORAL_TABLET | Freq: Every evening | ORAL | Status: DC | PRN
  Administered 2017-11-26: 1 via ORAL
  Filled 2017-11-22: qty 1

## 2017-11-22 MED ORDER — FUROSEMIDE 20 MG PO TABS: 20.0000 mg | ORAL_TABLET | Freq: Every day | ORAL | Status: DC

## 2017-11-22 MED ORDER — HEPARIN SODIUM (PORCINE) 5000 UNIT/ML IJ SOLN
5000.0000 [IU] | Freq: Three times a day (TID) | INTRAMUSCULAR | Status: DC
  Administered 2017-11-22 – 2017-11-26 (×13): 5000 [IU] via SUBCUTANEOUS
  Filled 2017-11-22 (×12): qty 1

## 2017-11-22 MED ORDER — INSULIN ASPART 100 UNIT/ML ~~LOC~~ SOLN: 0.0000 [IU] | Freq: Every day | SUBCUTANEOUS | Status: DC

## 2017-11-22 MED ORDER — ATORVASTATIN CALCIUM 20 MG PO TABS
80.0000 mg | ORAL_TABLET | Freq: Every day | ORAL | Status: DC
  Administered 2017-11-22 – 2017-11-25 (×4): 80 mg via ORAL
  Filled 2017-11-22 (×4): qty 4

## 2017-11-22 MED ORDER — TIOTROPIUM BROMIDE MONOHYDRATE 18 MCG IN CAPS
18.0000 ug | ORAL_CAPSULE | Freq: Every day | RESPIRATORY_TRACT | Status: DC
  Administered 2017-11-22 – 2017-11-23 (×2): 18 ug via RESPIRATORY_TRACT
  Filled 2017-11-22: qty 5

## 2017-11-22 MED ORDER — FLUOXETINE HCL 10 MG PO CAPS
10.0000 mg | ORAL_CAPSULE | Freq: Every day | ORAL | Status: DC
  Administered 2017-11-22 – 2017-11-26 (×5): 10 mg via ORAL
  Filled 2017-11-22 (×5): qty 1

## 2017-11-22 MED ORDER — ACETAMINOPHEN 325 MG PO TABS
650.0000 mg | ORAL_TABLET | Freq: Four times a day (QID) | ORAL | Status: DC | PRN
  Administered 2017-11-23 – 2017-11-25 (×3): 650 mg via ORAL
  Filled 2017-11-22 (×3): qty 2

## 2017-11-22 MED ORDER — ALBUTEROL SULFATE (2.5 MG/3ML) 0.083% IN NEBU: 2.5000 mg | INHALATION_SOLUTION | Freq: Four times a day (QID) | RESPIRATORY_TRACT | Status: DC | PRN

## 2017-11-22 MED ORDER — MECLIZINE HCL 25 MG PO TABS
25.0000 mg | ORAL_TABLET | Freq: Two times a day (BID) | ORAL | Status: DC | PRN
  Administered 2017-11-25: 25 mg via ORAL
  Filled 2017-11-22 (×2): qty 1

## 2017-11-22 MED ORDER — ALPRAZOLAM 0.5 MG PO TABS
0.5000 mg | ORAL_TABLET | Freq: Two times a day (BID) | ORAL | Status: DC | PRN
  Administered 2017-11-24: 0.5 mg via ORAL
  Filled 2017-11-22: qty 1

## 2017-11-22 MED ORDER — FLUTICASONE FUROATE-VILANTEROL 200-25 MCG/INH IN AEPB
1.0000 | INHALATION_SPRAY | Freq: Every day | RESPIRATORY_TRACT | Status: DC
  Administered 2017-11-22 – 2017-11-26 (×5): 1 via RESPIRATORY_TRACT
  Filled 2017-11-22 (×3): qty 28

## 2017-11-22 MED ORDER — MAGNESIUM SULFATE 2 GM/50ML IV SOLN
2.0000 g | Freq: Once | INTRAVENOUS | Status: AC
  Administered 2017-11-22: 2 g via INTRAVENOUS
  Filled 2017-11-22: qty 50

## 2017-11-22 MED ORDER — SODIUM CHLORIDE 0.9 % IV SOLN
1.0000 g | INTRAVENOUS | Status: DC
  Filled 2017-11-22: qty 10

## 2017-11-22 MED ORDER — PANTOPRAZOLE SODIUM 40 MG PO TBEC
40.0000 mg | DELAYED_RELEASE_TABLET | Freq: Every day | ORAL | Status: DC
  Administered 2017-11-22 – 2017-11-26 (×5): 40 mg via ORAL
  Filled 2017-11-22 (×5): qty 1

## 2017-11-22 MED ORDER — BISACODYL 5 MG PO TBEC
5.0000 mg | DELAYED_RELEASE_TABLET | Freq: Every day | ORAL | Status: DC | PRN
  Administered 2017-11-26: 5 mg via ORAL
  Filled 2017-11-22: qty 1

## 2017-11-22 MED ORDER — ACETAMINOPHEN 650 MG RE SUPP: 650.0000 mg | Freq: Four times a day (QID) | RECTAL | Status: DC | PRN

## 2017-11-22 MED ORDER — LACTATED RINGERS IV SOLN
INTRAVENOUS | Status: DC
  Administered 2017-11-22 (×2): via INTRAVENOUS

## 2017-11-22 MED ORDER — ONDANSETRON HCL 4 MG/2ML IJ SOLN
4.0000 mg | Freq: Four times a day (QID) | INTRAMUSCULAR | Status: DC | PRN
  Administered 2017-11-24: 4 mg via INTRAVENOUS
  Filled 2017-11-22: qty 2

## 2017-11-22 MED ORDER — SODIUM CHLORIDE 0.9 % IV SOLN
1.0000 g | Freq: Once | INTRAVENOUS | Status: AC
  Administered 2017-11-22: 1 g via INTRAVENOUS
  Filled 2017-11-22: qty 10

## 2017-11-22 MED ORDER — CLOPIDOGREL BISULFATE 75 MG PO TABS
75.0000 mg | ORAL_TABLET | Freq: Every day | ORAL | Status: DC
  Administered 2017-11-22 – 2017-11-26 (×5): 75 mg via ORAL
  Filled 2017-11-22 (×5): qty 1

## 2017-11-22 MED ORDER — OXYCODONE HCL 5 MG PO TABS
5.0000 mg | ORAL_TABLET | ORAL | Status: DC | PRN
  Administered 2017-11-22 – 2017-11-24 (×4): 5 mg via ORAL
  Filled 2017-11-22 (×4): qty 1

## 2017-11-22 MED ORDER — VITAMIN D 1000 UNITS PO TABS
1000.0000 [IU] | ORAL_TABLET | Freq: Every day | ORAL | Status: DC
  Administered 2017-11-22 – 2017-11-26 (×5): 1000 [IU] via ORAL
  Filled 2017-11-22 (×5): qty 1

## 2017-11-22 MED ORDER — ONDANSETRON HCL 4 MG PO TABS: 4.0000 mg | ORAL_TABLET | Freq: Four times a day (QID) | ORAL | Status: DC | PRN

## 2017-11-22 MED ORDER — INSULIN ASPART 100 UNIT/ML ~~LOC~~ SOLN
0.0000 [IU] | Freq: Three times a day (TID) | SUBCUTANEOUS | Status: DC
  Administered 2017-11-22 – 2017-11-23 (×2): 2 [IU] via SUBCUTANEOUS
  Administered 2017-11-23: 1 [IU] via SUBCUTANEOUS
  Administered 2017-11-23: 2 [IU] via SUBCUTANEOUS
  Administered 2017-11-24: 1 [IU] via SUBCUTANEOUS
  Administered 2017-11-24 (×2): 2 [IU] via SUBCUTANEOUS
  Administered 2017-11-25 (×2): 1 [IU] via SUBCUTANEOUS
  Administered 2017-11-25: 2 [IU] via SUBCUTANEOUS
  Administered 2017-11-26 (×2): 1 [IU] via SUBCUTANEOUS
  Filled 2017-11-22 (×12): qty 1

## 2017-11-22 NOTE — ED Notes (Signed)
Pt given soda to drink per request

## 2017-11-22 NOTE — ED Notes (Signed)
Attempt to reach pt son unsuccessful.

## 2017-11-22 NOTE — ED Notes (Signed)
Urine sent to lab 

## 2017-11-22 NOTE — Patient Outreach (Signed)
[facility]) Care Management  11/22/2017  [name] 1940-07-19 [id number]    Multidisciplinary Case Discussion on the above patient completed today. Suggestions made to collaborate with primary pulmonologist to facilitate approval for patient to call for acute pulmonary appointment for COPD exacerbation when appropriate instead of utilizing ED for all exacerbations. Will request that pulmonologist have discussion with patient/husband during next encounter. RN CM will continue to collaborate with CSW and follow patient closely as patient was readmitted to [facility] yesterday for mental status changes and UTI.  Plan: Will send in-basket to Dr. Mortimer Fries discussing above request.  Clois Dupes. Rollene Rotunda RN, BSN [facility] Care Management Coordinator [phone]

## 2017-11-22 NOTE — H&P (Addendum)
[facility] NAME: Katherine [name]    MR#:  [id number]  DATE OF BIRTH:  02/03/1941  DATE OF ADMISSION:  11/21/2017  PRIMARY CARE PHYSICIAN: Valerie Roys, DO   REQUESTING/REFERRING PHYSICIAN: Gregor Hams, MD  CHIEF COMPLAINT:   Chief Complaint  Patient presents with  . Altered Mental Status    HISTORY OF PRESENT ILLNESS:  Katherine [name]  is a 77 y.o. female with a known history of morbid obesity, HTN, HLD, GERD p/w AMS, UTI. Pt is AAOx3; she can tell me she is in [place], and it is 2019, but she is not a particularly good historian. Her speech is difficult to understand. She tells me she lives in an apartment in [place] with her husband. She states that she was having "the shakes" and felt "weak all over". She states she "couldn't do anything". Her husband called EMS. She denies urinary symptoms. She is entirely without complaint, and states she feels well. SIRS (-), does not appear septic/toxic.  PAST MEDICAL HISTORY:   Past Medical History:  Diagnosis Date  . Angioedema   . Anxiety   . Anxiety and depression   . CHF (congestive heart failure) ([place])   . Chronic constipation   . Chronic kidney disease    stage 3  . COPD (chronic obstructive pulmonary disease) ([place])   . Gallstones   . GERD (gastroesophageal reflux disease)   . Hypertension   . Left ventricular hypertrophy   . Osteoarthritis   . Prurigo nodularis   . Seizures ([place])   . Stroke ([place])   . Tobacco abuse   . Vitamin D deficiency disease     PAST SURGICAL HISTORY:   Past Surgical History:  Procedure Laterality Date  . CHOLECYSTECTOMY    . 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   Procedure: TOTAL KNEE ARTHROPLASTY;  Surgeon: Hessie Knows, MD;  Location: [facility] ORS;  Service: Orthopedics;  Laterality: Right;    SOCIAL HISTORY:   Social History   Tobacco Use  . Smoking status: Former Smoker   Packs/day: 0.50    Years: 60.00    Pack years: 30.00    Types: Cigarettes  . Smokeless tobacco: Never Used  Substance Use Topics  . Alcohol use: No    Alcohol/week: 0.0 standard drinks    Comment: rare    FAMILY HISTORY:   Family History  Problem Relation Age of Onset  . Alcohol abuse Mother   . Sickle cell anemia Daughter   . Hypertension Son   . Cancer Neg Hx   . COPD Neg Hx   . Diabetes Neg Hx   . Heart disease Neg Hx   . Stroke Neg Hx     DRUG ALLERGIES:   Allergies  Allergen Reactions  . Bee Venom Swelling  . Enalapril Maleate Swelling    REVIEW OF SYSTEMS:   Review of Systems  Constitutional: Positive for chills and malaise/fatigue. Negative for diaphoresis, fever and weight loss.  HENT: Negative for congestion, ear pain, hearing loss, nosebleeds, sinus pain, sore throat and tinnitus.   Eyes: Positive for redness (Endorses chronic redness of eyes, denies discomfort). Negative for blurred vision, double vision, photophobia, pain and discharge.  Respiratory: Negative for cough, hemoptysis, sputum production, shortness of breath and wheezing.   Cardiovascular: Negative for chest pain, palpitations, orthopnea, claudication and leg swelling.  Gastrointestinal: Negative for abdominal pain, blood in stool, constipation, diarrhea, heartburn, melena,  nausea and vomiting.  Genitourinary: Negative for dysuria, flank pain, frequency, hematuria and urgency.  Musculoskeletal: Negative for back pain, joint pain, myalgias and neck pain.  Skin: Negative for itching and rash.  Neurological: Positive for tremors and weakness. Negative for dizziness, tingling, sensory change, speech change, focal weakness, seizures, loss of consciousness and headaches.  Psychiatric/Behavioral: Negative for memory loss. The patient does not have insomnia.    MEDICATIONS AT HOME:   Prior to Admission medications   Medication Sig Start Date End Date Taking? Authorizing Provider  acetaminophen  (TYLENOL) 650 MG CR tablet Take 650-1,300 mg by mouth every 8 (eight) hours as needed for pain.    [provider]  albuterol (ACCUNEB) 0.63 MG/3ML nebulizer solution Take 3 mLs by nebulization 3 (three) times daily as needed for wheezing.    [provider]  albuterol (PROVENTIL HFA;VENTOLIN HFA) 108 (90 Base) MCG/ACT inhaler Inhale 2 puffs into the lungs every 4 (four) hours as needed for wheezing or shortness of breath.    [provider]  ALPRAZolam Duanne Moron) 0.5 MG tablet Take 0.5 mg by mouth 2 (two) times daily as needed for anxiety.    [provider]  atorvastatin (LIPITOR) 80 MG tablet Take 80 mg by mouth daily.    [provider]  cetirizine (ZYRTEC) 10 MG tablet Take 5 mg by mouth at bedtime.    [provider]  cholecalciferol (VITAMIN D) 1000 units tablet Take 1,000 Units by mouth daily.    [provider]  clopidogrel (PLAVIX) 75 MG tablet Take 75 mg by mouth daily.    [provider]  diltiazem (CARDIZEM SR) 120 MG 12 hr capsule Take 120 mg by mouth 2 (two) times daily.    [provider]  docusate sodium (COLACE) 100 MG capsule Take 100 mg by mouth daily.    [provider]  FLUoxetine (PROZAC) 10 MG capsule Take 10 mg by mouth daily.    [provider]  Fluticasone-Umeclidin-Vilant 100-62.5-25 MCG/INH AEPB Inhale 1 puff into the lungs daily.    [provider]  furosemide (LASIX) 20 MG tablet Take 1 tablet (20 mg total) by mouth daily. 11/14/17   Salary, Avel Peace, MD  gabapentin (NEURONTIN) 300 MG capsule Take 300 mg by mouth at bedtime.    [provider]  guaiFENesin (MUCINEX) 600 MG 12 hr tablet Take 1 tablet (600 mg total) by mouth 2 (two) times daily. 11/13/17   Salary, Avel Peace, MD  hydrALAZINE (APRESOLINE) 25 MG tablet Take 25 mg by mouth 3 (three) times daily.    [provider]  ipratropium (ATROVENT) 0.03 % nasal spray Place 2 sprays into both nostrils  every 12 (twelve) hours.    [provider]  ipratropium-albuterol (DUONEB) 0.5-2.5 (3) MG/3ML SOLN Take 3 mLs by nebulization every 6 (six) hours as needed (shortness of breath).    [provider]  levETIRAcetam (KEPPRA XR) 500 MG 24 hr tablet Take 1 tablet (500 mg total) by mouth daily. 11/19/17   Venancio Poisson, NP  meclizine (ANTIVERT) 25 MG tablet Take 25 mg by mouth 2 (two) times daily as needed for dizziness.    [provider]  methocarbamol (ROBAXIN) 500 MG tablet Take 500 mg by mouth every 8 (eight) hours as needed for muscle spasms.    [provider]  pantoprazole (PROTONIX) 40 MG tablet Take 40 mg by mouth daily.    [provider]  tiotropium (SPIRIVA) 18 MCG inhalation capsule Place 18 mcg into  inhaler and inhale daily.    [provider]      VITAL SIGNS:  Blood pressure 109/84, pulse 81, temperature 98.3 F (36.8 C), temperature source Oral, resp. rate 15, height 5\' 6"  (1.676 m), weight 113.4 kg, SpO2 100 %.  PHYSICAL EXAMINATION:  Physical Exam  Constitutional: She is oriented to person, place, and time. She appears well-developed and well-nourished. She is active and cooperative.  Non-toxic appearance. She does not appear ill. No distress. She is not intubated.  HENT:  Head: Atraumatic.  Mouth/Throat: Oropharynx is clear and moist. No oropharyngeal exudate.  Eyes: EOM and lids are normal. No scleral icterus.  Neck: Neck supple. No JVD present. No thyromegaly present.  Cardiovascular: Normal rate, regular rhythm, S1 normal, S2 normal and normal heart sounds.  No extrasystoles are present. Exam reveals no gallop, no S3, no S4, no distant heart sounds and no friction rub.  No murmur heard. Pulmonary/Chest: Effort normal and breath sounds normal. No accessory muscle usage or stridor. No apnea, no tachypnea and no bradypnea. She is not intubated. No respiratory distress. She has no decreased breath sounds. She has no  wheezes. She has no rhonchi. She has no rales.  Abdominal: Soft. Bowel sounds are normal. She exhibits no distension. There is no tenderness. There is no rigidity, no rebound and no guarding.  Musculoskeletal: Normal range of motion. She exhibits no edema or tenderness.  Lymphadenopathy:    She has no cervical adenopathy.  Neurological: She is alert and oriented to person, place, and time. She is not disoriented.  Skin: Skin is warm and dry. She is not diaphoretic. No erythema.  Psychiatric: She has a normal mood and affect. Her behavior is normal. Judgment and thought content normal. Her speech is slurred. Cognition and memory are normal.   LABORATORY PANEL:   CBC Recent Labs  Lab 11/21/17 1948  WBC 7.5  HGB 10.3*  HCT 33.8*  PLT 200   ------------------------------------------------------------------------------------------------------------------  Chemistries  Recent Labs  Lab 11/21/17 1948  NA 144  K 4.1  CL 104  CO2 32  GLUCOSE 161*  BUN 46*  CREATININE 2.42*  CALCIUM 7.8*  AST 18  ALT 27  ALKPHOS 62  BILITOT 0.3   ------------------------------------------------------------------------------------------------------------------  Cardiac Enzymes Recent Labs  Lab 11/20/17 2134  TROPONINI <0.03   ------------------------------------------------------------------------------------------------------------------  RADIOLOGY:  Nm Pulmonary Vent And Perf (v/q Scan)  Result Date: 11/20/2017 CLINICAL DATA:  Pleural effusion. Fall. Upper back pain radiating to the left side EXAM: NUCLEAR MEDICINE VENTILATION - PERFUSION LUNG SCAN TECHNIQUE: Ventilation images were obtained in multiple projections using inhaled aerosol Tc-78m DTPA. Perfusion images were obtained in multiple projections after intravenous injection of Tc-15m-MAA. RADIOPHARMACEUTICALS:  Thirty-two mCi of Tc-28m DTPA aerosol inhalation and 4.3 mCi Tc70m-MAA IV COMPARISON:  Chest radiograph 11/20/2017 FINDINGS:  Ventilation: Considerable aerosol deposition along the tracheobronchial tree. Reduced activity posteriorly in both lower lobes. Perfusion: Reduced activity posteriorly in both lower lobes on the lateral projections, matching findings on the ventilation portion of the exam. The area of involvement is small. IMPRESSION: 1. Small segmental matched reduced perfusion and ventilation activity posteriorly in both lower lobes. Appearance reflects low probability of pulmonary embolus (10 - 19% risk) based on PIOPED 2 criteria. Electronically Signed   By: Van Clines M.D.   On: 11/20/2017 20:50   US Venous Img Lower Bilateral  Result Date: 11/22/2017 CLINICAL DATA:  Bilateral leg pain for 1 day. EXAM: BILATERAL LOWER EXTREMITY VENOUS DOPPLER ULTRASOUND TECHNIQUE: Gray-scale sonography with graded compression,  as well as color Doppler and duplex ultrasound were performed to evaluate the lower extremity deep venous systems from the level of the common femoral vein and including the common femoral, femoral, profunda femoral, popliteal and calf veins including the posterior tibial, peroneal and gastrocnemius veins when visible. The superficial great saphenous vein was also interrogated. Spectral Doppler was utilized to evaluate flow at rest and with distal augmentation maneuvers in the common femoral, femoral and popliteal veins. COMPARISON:  None. FINDINGS: RIGHT LOWER EXTREMITY Common Femoral Vein: No evidence of thrombus. Normal compressibility, respiratory phasicity and response to augmentation. Saphenofemoral Junction: No evidence of thrombus. Normal compressibility and flow on color Doppler imaging. Profunda Femoral Vein: No evidence of thrombus. Normal compressibility and flow on color Doppler imaging. Femoral Vein: No evidence of thrombus. Normal compressibility, respiratory phasicity and response to augmentation. Popliteal Vein: No evidence of thrombus. Normal compressibility, respiratory phasicity and  response to augmentation. Calf Veins: No evidence of thrombus. Normal compressibility and flow on color Doppler imaging. Superficial Great Saphenous Vein: No evidence of thrombus. Normal compressibility. Venous Reflux:  None. Other Findings:  None. LEFT LOWER EXTREMITY Common Femoral Vein: No evidence of thrombus. Normal compressibility, respiratory phasicity and response to augmentation. Saphenofemoral Junction: No evidence of thrombus. Normal compressibility and flow on color Doppler imaging. Profunda Femoral Vein: No evidence of thrombus. Normal compressibility and flow on color Doppler imaging. Femoral Vein: No evidence of thrombus. Normal compressibility, respiratory phasicity and response to augmentation. Popliteal Vein: No evidence of thrombus. Normal compressibility, respiratory phasicity and response to augmentation. Calf Veins: No evidence of thrombus. Normal compressibility and flow on color Doppler imaging. Superficial Great Saphenous Vein: No evidence of thrombus. Normal compressibility. Venous Reflux:  None. Other Findings: Complex cysts in the popliteal fossa measuring 3.9 x 1.1 x 3.3 cm. IMPRESSION: 1. No evidence of deep venous thrombosis. 2. Popliteal cyst on the left. Electronically Signed   By: Lucienne Capers M.D.   On: 11/22/2017 01:03   Dg Chest Portable 1 View  Result Date: 11/20/2017 CLINICAL DATA:  Back pain. EXAM: PORTABLE CHEST 1 VIEW COMPARISON:  Radiographs of November 10, 2017. FINDINGS: Stable cardiomediastinal silhouette. No pneumothorax or pleural effusion is noted. Atherosclerosis of thoracic aorta is noted. Left lung is clear. Mild right basilar subsegmental atelectasis or scarring is noted. Bony thorax is unremarkable. IMPRESSION: Mild right basilar subsegmental atelectasis or scarring. Aortic Atherosclerosis (ICD10-I70.0). Electronically Signed   By: Marijo Conception, M.D.   On: 11/20/2017 17:32   Dg Knee Complete 4 Views Left  Result Date: 11/21/2017 CLINICAL DATA:   Left knee pain. No reported injury. EXAM: LEFT KNEE - COMPLETE 4+ VIEW COMPARISON:  None. FINDINGS: Degenerative changes in the left knee with medial greater than lateral compartment narrowing and small osteophyte formation. Cartilaginous calcification in the medial and lateral compartments. No evidence of acute fracture or dislocation. No focal bone lesion or bone destruction. Bone cortex appears intact. No significant effusion. IMPRESSION: Degenerative changes in the left knee. No acute bony abnormalities. Electronically Signed   By: Lucienne Capers M.D.   On: 11/21/2017 22:53   IMPRESSION AND PLAN:   A/P: 43F UTI, AKI on CKD. Hyperglycemia, hypocalcemia, hypoalbuminemia/hypoproteinemia, normocytic anemia. -UTI: U/A cloudy, (+) blood, leuk est, bacteria, RBC, WBC. (+) UTI. SIRS (-). Does not appear septic/toxic. UCx pending. Ceftriaxone. -AKI on CKD: Baseline Cr 1.6. Cr 2.4 on present admission. Pt likely w/ prerenal AKI superimposed on CKD III (2/2 HTN, aged kidney). EF 60-65% as of 10/05/2017 Echo, IVF. Urine studies (  electrolytes, protein, creatinine, urea), renal U/S pending. Monitor BMP, avoid nephrotoxins. -Hypocalcemia: Ionized calcium. -Hypoalbuminemia/hypoproteinemia: Prealbumin. -Normocytic anemia: Likely anemia of chronic disease. No evidence of acute blood loss at present time. -c/w other home meds/formulary subs. -FEN/GI: Renal diabetic diet. -DVT PPx: Heparin. -Code status: Full code. -Disposition: Admission, > 2 midnights.   All the records are reviewed and case discussed with ED provider. Management plans discussed with the patient, family and they are in agreement.  CODE STATUS: Full code.  TOTAL TIME TAKING CARE OF THIS PATIENT: 90 minutes.    Arta Silence M.D on 11/22/2017 at 5:18 AM  Between 7am to 6pm - Pager - [phone]  After 6pm go to [email or web address] - Proofreader  [facility]  Office  [phone]  CC: Primary care  physician; Valerie Roys, DO   Note: This dictation was prepared with Dragon dictation along with smaller phrase technology. Any transcriptional errors that result from this process are unintentional.

## 2017-11-22 NOTE — ED Notes (Signed)
Pt attempting to urinate.

## 2017-11-23 ENCOUNTER — Encounter: Payer: Self-pay | Admitting: Internal Medicine

## 2017-11-23 LAB — URINE CULTURE: Culture: NO GROWTH

## 2017-11-23 LAB — BASIC METABOLIC PANEL
Anion gap: 6 (ref 5–15)
BUN: 30 mg/dL — AB (ref 8–23)
CHLORIDE: 103 mmol/L (ref 98–111)
CO2: 31 mmol/L (ref 22–32)
CREATININE: 1.7 mg/dL — AB (ref 0.44–1.00)
Calcium: 7.8 mg/dL — ABNORMAL LOW (ref 8.9–10.3)
GFR calc Af Amer: 33 mL/min — ABNORMAL LOW (ref >60)
GFR calc non Af Amer: 28 mL/min — ABNORMAL LOW (ref >60)
GLUCOSE: 151 mg/dL — AB (ref 70–99)
Potassium: 3.7 mmol/L (ref 3.5–5.1)
Sodium: 140 mmol/L (ref 135–145)

## 2017-11-23 LAB — GLUCOSE, CAPILLARY
GLUCOSE-CAPILLARY: 152 mg/dL — AB (ref 70–99)
GLUCOSE-CAPILLARY: 170 mg/dL — AB (ref 70–99)
Glucose-Capillary: 130 mg/dL — ABNORMAL HIGH (ref 70–99)
Glucose-Capillary: 174 mg/dL — ABNORMAL HIGH (ref 70–99)

## 2017-11-23 LAB — CALCIUM, IONIZED: CALCIUM, IONIZED, SERUM: 4.3 mg/dL — AB (ref 4.5–5.6)

## 2017-11-23 LAB — UREA NITROGEN, URINE: UREA NITROGEN UR: 537 mg/dL

## 2017-11-23 MED ORDER — IPRATROPIUM-ALBUTEROL 0.5-2.5 (3) MG/3ML IN SOLN
3.0000 mL | Freq: Four times a day (QID) | RESPIRATORY_TRACT | Status: DC
  Administered 2017-11-23 – 2017-11-25 (×9): 3 mL via RESPIRATORY_TRACT
  Filled 2017-11-23 (×9): qty 3

## 2017-11-23 MED ORDER — GABAPENTIN 300 MG PO CAPS
300.0000 mg | ORAL_CAPSULE | Freq: Three times a day (TID) | ORAL | Status: DC
  Administered 2017-11-23 (×3): 300 mg via ORAL
  Filled 2017-11-23 (×3): qty 1

## 2017-11-23 MED ORDER — FUROSEMIDE 40 MG PO TABS
40.0000 mg | ORAL_TABLET | Freq: Every day | ORAL | Status: DC
  Administered 2017-11-23 – 2017-11-25 (×3): 40 mg via ORAL
  Filled 2017-11-23 (×3): qty 1

## 2017-11-23 MED ORDER — SODIUM CHLORIDE 0.9 % IV SOLN
1.0000 g | INTRAVENOUS | Status: AC
  Administered 2017-11-23: 1 g via INTRAVENOUS
  Filled 2017-11-23: qty 1

## 2017-11-23 MED ORDER — CEPHALEXIN 250 MG PO CAPS
250.0000 mg | ORAL_CAPSULE | Freq: Three times a day (TID) | ORAL | Status: DC
  Administered 2017-11-24: 250 mg via ORAL
  Filled 2017-11-23: qty 1

## 2017-11-23 NOTE — NC FL2 (Signed)
[place] LEVEL OF CARE SCREENING TOOL     IDENTIFICATION  Patient Name: Katherine [name] Birthdate: Mar 03, 1940 Sex: female Admission Date (Current Location): 11/21/2017  [place] and [place] Number:  Engineering geologist and Address:  [facility], [address] [place], [place] [address]      Provider Number: [id number]  Attending Physician Name and Address:  Hillary Bow, MD  Relative Name and Phone Number:       Current Level of Care: Hospital Recommended Level of Care: [place] Prior Approval Number:    Date Approved/Denied:   PASRR Number: [id number])  Discharge Plan: SNF    Current Diagnoses: Patient Active Problem List   Diagnosis Date Noted  . Urinary tract infection 11/22/2017  . Acute respiratory distress 11/22/2017  . 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[place]) 11/16/2017  . Diabetes mellitus type 2, uncomplicated ([place]) 05/39/7673  . Acute on chronic respiratory failure with hypoxia ([place]) 11/10/2017  . Pressure injury of skin 10/29/2017  . Sepsis ([place]) 10/24/2017  . Acute kidney injury superimposed on CKD ([place]) 10/24/2017  . Severe sepsis ([place]) 10/24/2017  . Possible Seizures ([place]) 10/08/2017  . Hyperlipemia 10/08/2017  . 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 Stroke-like episode ([place]) s/p IV tpa 10/04/2017  . Palliative care by specialist   . Elevated rheumatoid factor 09/05/2017  . Frequent hospital admissions 08/22/2017  . Aphasia 06/28/2017  . Hand pain 03/21/2017  . Dry skin 03/21/2017  . Pain in finger of left hand 09/12/2016  . Chronic fatigue 06/12/2016  . Left knee pain 06/12/2016  . Goals of care, counseling/discussion 03/13/2016  . Primary localized osteoarthritis of right knee 02/08/2016  . OSA (obstructive sleep apnea) 09/16/2015  . Rotator cuff syndrome 09/07/2015  . 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 Sleep disturbance 04/14/2015  . Coronary artery disease  03/14/2015  . 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 Anxiety   . Tobacco abuse   . Prurigo nodularis   . GERD (gastroesophageal reflux disease)   . COPD (chronic obstructive pulmonary disease) ([place])   . Osteoarthritis   . Vitamin D deficiency disease   . Chronic constipation   . CKD (chronic kidney disease), stage III ([place])   . LVH (left ventricular hypertrophy) 05/20/2013  . Essential hypertension 05/20/2013  . Morbid obesity ([place]) 05/20/2013    Orientation RESPIRATION BLADDER Height & Weight     Self, Time, Situation, Place  O2(2 Liters Oxygen. ) Incontinent Weight: 250 lb (113.4 kg) Height:  5\' 6"  (167.6 cm)  BEHAVIORAL SYMPTOMS/MOOD NEUROLOGICAL BOWEL NUTRITION STATUS      Continent Diet(Diet: Renal/ Carb Modified. )  AMBULATORY STATUS COMMUNICATION OF NEEDS Skin   Extensive Assist Verbally PU Stage and Appropriate Care(Pressure Ulcer Stage 2 on Buttocks. )                       Personal Care Assistance Level of Assistance  Bathing, Feeding, Dressing Bathing Assistance: Limited assistance Feeding assistance: Independent Dressing Assistance: Limited assistance     Functional Limitations Info  Sight, Hearing, Speech Sight Info: Impaired Hearing Info: Impaired Speech Info: Adequate    SPECIAL CARE FACTORS FREQUENCY  PT (By licensed PT), OT (By licensed OT)     PT Frequency: (5) OT Frequency: (5)            Contractures      Additional Factors Info  Code Status, Allergies Code Status Info: (Full Code. )  Allergies Info: (Bee Venom, Enalapril Maleate)           Current Medications (11/23/2017):  This is the current hospital active medication list Current Facility-Administered Medications  Medication Dose Route Frequency Provider Last Rate Last Dose  . acetaminophen (TYLENOL) tablet 650 mg  650 mg Oral Q6H PRN Arta Silence, MD       Or  . acetaminophen (TYLENOL) suppository 650 mg  650 mg Rectal Q6H PRN  Arta Silence, MD      . albuterol (PROVENTIL) (2.5 MG/3ML) 0.083% nebulizer solution 2.5 mg  2.5 mg Nebulization Q6H PRN Arta Silence, MD      . ALPRAZolam Duanne Moron) tablet 0.5 mg  0.5 mg Oral BID PRN Arta Silence, MD      . atorvastatin (LIPITOR) tablet 80 mg  80 mg Oral Daily Arta Silence, MD   80 mg at 11/22/17 1734  . bisacodyl (DULCOLAX) EC tablet 5 mg  5 mg Oral Daily PRN Arta Silence, MD      . cefTRIAXone (ROCEPHIN) 1 g in sodium chloride 0.9 % 100 mL IVPB  1 g Intravenous Q24H Arta Silence, MD      . cholecalciferol (VITAMIN D) tablet 1,000 Units  1,000 Units Oral Daily Arta Silence, MD   1,000 Units at 11/23/17 0842  . clopidogrel (PLAVIX) tablet 75 mg  75 mg Oral Daily Arta Silence, MD   75 mg at 11/23/17 0842  . diltiazem (CARDIZEM SR) 12 hr capsule 120 mg  120 mg Oral BID Arta Silence, MD   120 mg at 11/23/17 0842  . FLUoxetine (PROZAC) capsule 10 mg  10 mg Oral Daily Arta Silence, MD   10 mg at 11/23/17 0842  . fluticasone furoate-vilanterol (BREO ELLIPTA) 200-25 MCG/INH 1 puff  1 puff Inhalation Daily Arta Silence, MD   1 puff at 11/23/17 0843  . furosemide (LASIX) tablet 40 mg  40 mg Oral Daily Hillary Bow, MD   40 mg at 11/23/17 1235  . gabapentin (NEURONTIN) capsule 300 mg  300 mg Oral TID Hillary Bow, MD   300 mg at 11/23/17 1235  . heparin injection 5,000 Units  5,000 Units Subcutaneous Q8H Arta Silence, MD   5,000 Units at 11/23/17 0503  . hydrALAZINE (APRESOLINE) tablet 25 mg  25 mg Oral TID Arta Silence, MD   25 mg at 11/22/17 2155  . insulin aspart (novoLOG) injection 0-5 Units  0-5 Units Subcutaneous QHS Sridharan, Prasanna, MD      . insulin aspart (novoLOG) injection 0-9 Units  0-9 Units Subcutaneous TID WC Arta Silence, MD   2 Units at 11/23/17 1235  . ipratropium-albuterol (DUONEB) 0.5-2.5 (3) MG/3ML nebulizer solution 3 mL  3 mL Nebulization Q6H Sudini, Srikar, MD       . levETIRAcetam (KEPPRA XR) 24 hr tablet 500 mg  500 mg Oral Daily Arta Silence, MD   500 mg at 11/23/17 0842  . meclizine (ANTIVERT) tablet 25 mg  25 mg Oral BID PRN Arta Silence, MD      . methocarbamol (ROBAXIN) tablet 500 mg  500 mg Oral Q8H PRN Arta Silence, MD      . ondansetron (ZOFRAN) tablet 4 mg  4 mg Oral Q6H PRN Arta Silence, MD       Or  . ondansetron (ZOFRAN) injection 4 mg  4 mg Intravenous Q6H PRN Arta Silence, MD      . oxyCODONE (Oxy IR/ROXICODONE) immediate release tablet 5-10 mg  5-10 mg Oral Q4H PRN Arta Silence, MD   5  mg at 11/23/17 0842  . pantoprazole (PROTONIX) EC tablet 40 mg  40 mg Oral Daily Arta Silence, MD   40 mg at 11/23/17 0842  . senna-docusate (Senokot-S) tablet 1 tablet  1 tablet Oral QHS PRN Arta Silence, MD         Discharge Medications: Please see discharge summary for a list of discharge medications.  Relevant Imaging Results:  Relevant Lab Results:   Additional Information (SSN: [id number])  Kamalani Mastro, Veronia Beets, LCSW

## 2017-11-23 NOTE — Care Management Note (Signed)
Case Management Note  Patient Details  Name: Katherine [name] MRN: [id number] Date of Birth: [birth year]  Subjective/Objective:                  RNCM met with patient to discuss transition of care. She is having severe foot pain. Two calls to RN.  RNCM repositioned her feet but that was limited relief. She wants to go home however her husband feels rehab would be better.  During my meeting with patient, [name] RN with [facility] (outpatient CM) contacted me stating that her Rh factor was over 300 and they have been trying to get her seen as outpatient (has appointment on 11/27/17) but they can't keep her out of the hospital long enough to get her seen.  She has a wheelchair in the home. She has home O2 at home but doesn't recall agency name.  If she can go home [facility].   Action/Plan: Request made to MD for consult to rheumatology. Referral to [facility] home health.   Expected Discharge Date:  11/24/17               Expected Discharge Plan:     In-House Referral:     Discharge planning Services  CM Consult  Post Acute Care Choice:  Home Health Choice offered to:  Patient  DME Arranged:    DME Agency:     HH Arranged:  PT, Social Work [place] Agency:  [facility]  Status of Service:  In process, will continue to follow  If discussed at Long Length of Stay Meetings, dates discussed:    Additional Comments:  Marshell Garfinkel, RN 11/23/2017, 1:34 PM

## 2017-11-23 NOTE — Progress Notes (Signed)
[facility] NAME: Katherine [name]    MR#:  [id number]  DATE OF BIRTH:  09-23-1940  SUBJECTIVE:  CHIEF COMPLAINT:   Chief Complaint  Patient presents with  . Altered Mental Status   Altered mental status has improved.  Patient is poor historian. Complains of bilateral leg pain which is chronic but worse over the past month.  REVIEW OF SYSTEMS:    Review of Systems  Constitutional: Positive for malaise/fatigue. Negative for chills and fever.  HENT: Negative for sore throat.   Eyes: Negative for blurred vision, double vision and pain.  Respiratory: Positive for cough. Negative for hemoptysis, shortness of breath and wheezing.   Cardiovascular: Negative for chest pain, palpitations, orthopnea and leg swelling.  Gastrointestinal: Negative for abdominal pain, constipation, diarrhea, heartburn, nausea and vomiting.  Genitourinary: Negative for dysuria and hematuria.  Musculoskeletal: Positive for joint pain. Negative for back pain.  Skin: Negative for rash.  Neurological: Negative for sensory change, speech change, focal weakness and headaches.  Endo/Heme/Allergies: Does not bruise/bleed easily.  Psychiatric/Behavioral: Negative for depression. The patient is not nervous/anxious.     DRUG ALLERGIES:   Allergies  Allergen Reactions  . Bee Venom Swelling  . Enalapril Maleate Swelling    VITALS:  Blood pressure 120/65, pulse 66, temperature 98.7 F (37.1 C), temperature source Oral, resp. rate 20, height 5\' 6"  (1.676 m), weight 113.4 kg, SpO2 95 %.  PHYSICAL EXAMINATION:   Physical Exam  GENERAL:  77 y.o.-year-old patient lying in the bed with no acute distress. Obese EYES: Pupils equal, round, reactive to light and accommodation. No scleral icterus. Extraocular muscles intact.  HEENT: Head atraumatic, normocephalic. Oropharynx and nasopharynx clear.  NECK:  Supple, no jugular venous distention. No thyroid enlargement, no  tenderness.  LUNGS: Mild wheezing CARDIOVASCULAR: S1, S2 normal. No murmurs, rubs, or gallops.  ABDOMEN: Soft, nontender, nondistended. Bowel sounds present. No organomegaly or mass.  EXTREMITIES: No cyanosis, clubbing.  Bilateral lower committee edema NEUROLOGIC: Cranial nerves II through XII are intact. No focal Motor or sensory deficits b/l.   PSYCHIATRIC: The patient is alert and awake SKIN: No obvious rash, lesion, or ulcer.   LABORATORY PANEL:   CBC Recent Labs  Lab 11/21/17 1948  WBC 7.5  HGB 10.3*  HCT 33.8*  PLT 200   ------------------------------------------------------------------------------------------------------------------ Chemistries  Recent Labs  Lab 11/21/17 1948 11/22/17 0654 11/23/17 0511  NA 144  --  140  K 4.1  --  3.7  CL 104  --  103  CO2 32  --  31  GLUCOSE 161*  --  151*  BUN 46*  --  30*  CREATININE 2.42*  --  1.70*  CALCIUM 7.8*  --  7.8*  MG  --  1.5*  --   AST 18  --   --   ALT 27  --   --   ALKPHOS 62  --   --   BILITOT 0.3  --   --    ------------------------------------------------------------------------------------------------------------------  Cardiac Enzymes Recent Labs  Lab 11/20/17 2134  TROPONINI <0.03   ------------------------------------------------------------------------------------------------------------------  RADIOLOGY:  US Renal  Result Date: 11/22/2017 CLINICAL DATA:  Acute kidney injury. EXAM: RENAL / URINARY TRACT ULTRASOUND COMPLETE COMPARISON:  CT, 05/04/2017 FINDINGS: Right Kidney: Length: 9.5 cm. Mildly increased renal parenchymal echogenicity. Mild diffuse renal cortical thinning. No mass or stone. No hydronephrosis. Left Kidney: Length: 8.6 cm. Increased renal parenchymal echogenicity. Diffuse renal cortical thinning greater than that on the right.  Cyst protrudes from the upper pole measuring 14 x 11 x 12 mm. No other renal masses. No stones. No hydronephrosis. Bladder: Appears normal for degree of  bladder distention. IMPRESSION: 1. No acute findings.  No hydronephrosis. 2. Increased renal parenchymal echogenicity and renal cortical thinning, left greater than right, consistent with medical renal disease. 3. 14 mm left renal cyst. Electronically Signed   By: Lajean Manes M.D.   On: 11/22/2017 14:13   US Venous Img Lower Bilateral  Result Date: 11/22/2017 CLINICAL DATA:  Bilateral leg pain for 1 day. EXAM: BILATERAL LOWER EXTREMITY VENOUS DOPPLER ULTRASOUND TECHNIQUE: Gray-scale sonography with graded compression, as well as color Doppler and duplex ultrasound were performed to evaluate the lower extremity deep venous systems from the level of the common femoral vein and including the common femoral, femoral, profunda femoral, popliteal and calf veins including the posterior tibial, peroneal and gastrocnemius veins when visible. The superficial great saphenous vein was also interrogated. Spectral Doppler was utilized to evaluate flow at rest and with distal augmentation maneuvers in the common femoral, femoral and popliteal veins. COMPARISON:  None. FINDINGS: RIGHT LOWER EXTREMITY Common Femoral Vein: No evidence of thrombus. Normal compressibility, respiratory phasicity and response to augmentation. Saphenofemoral Junction: No evidence of thrombus. Normal compressibility and flow on color Doppler imaging. Profunda Femoral Vein: No evidence of thrombus. Normal compressibility and flow on color Doppler imaging. Femoral Vein: No evidence of thrombus. Normal compressibility, respiratory phasicity and response to augmentation. Popliteal Vein: No evidence of thrombus. Normal compressibility, respiratory phasicity and response to augmentation. Calf Veins: No evidence of thrombus. Normal compressibility and flow on color Doppler imaging. Superficial Great Saphenous Vein: No evidence of thrombus. Normal compressibility. Venous Reflux:  None. Other Findings:  None. LEFT LOWER EXTREMITY Common Femoral Vein: No  evidence of thrombus. Normal compressibility, respiratory phasicity and response to augmentation. Saphenofemoral Junction: No evidence of thrombus. Normal compressibility and flow on color Doppler imaging. Profunda Femoral Vein: No evidence of thrombus. Normal compressibility and flow on color Doppler imaging. Femoral Vein: No evidence of thrombus. Normal compressibility, respiratory phasicity and response to augmentation. Popliteal Vein: No evidence of thrombus. Normal compressibility, respiratory phasicity and response to augmentation. Calf Veins: No evidence of thrombus. Normal compressibility and flow on color Doppler imaging. Superficial Great Saphenous Vein: No evidence of thrombus. Normal compressibility. Venous Reflux:  None. Other Findings: Complex cysts in the popliteal fossa measuring 3.9 x 1.1 x 3.3 cm. IMPRESSION: 1. No evidence of deep venous thrombosis. 2. Popliteal cyst on the left. Electronically Signed   By: Lucienne Capers M.D.   On: 11/22/2017 01:03   Dg Knee Complete 4 Views Left  Result Date: 11/21/2017 CLINICAL DATA:  Left knee pain. No reported injury. EXAM: LEFT KNEE - COMPLETE 4+ VIEW COMPARISON:  None. FINDINGS: Degenerative changes in the left knee with medial greater than lateral compartment narrowing and small osteophyte formation. Cartilaginous calcification in the medial and lateral compartments. No evidence of acute fracture or dislocation. No focal bone lesion or bone destruction. Bone cortex appears intact. No significant effusion. IMPRESSION: Degenerative changes in the left knee. No acute bony abnormalities. Electronically Signed   By: Lucienne Capers M.D.   On: 11/21/2017 22:53     ASSESSMENT AND PLAN:   *UTI.  On IV ceftriaxone.  Cultures pending.  *Bilateral leg pain.  Chronic neuropathy seems to be worsening.  Will increase dose of Neurontin.  Physical therapy consulted as patient is having difficulty walking.  *COPD exacerbation Will start steroids.  Scheduled nebulizers.  Inhalers.  *Acute kidney injury over CKD stage III has resolved.  *Chronic diastolic congestive heart failure.  Restart home dose of Lasix now that creatinine is improved.  *DVT prophylaxis with Lovenox  Physical therapy recommending skilled nursing facility.  All the records are reviewed and case discussed with Care Management/Social Workerr. Management plans discussed with the patient, family and they are in agreement.  CODE STATUS: FULL CODE  DVT Prophylaxis: SCDs  TOTAL TIME TAKING CARE OF THIS PATIENT: 35 minutes.   POSSIBLE D/C IN 1-2 DAYS, DEPENDING ON CLINICAL CONDITION.  Leia Alf Kieara Schwark M.D on 11/23/2017 at 12:30 PM  Between 7am to 6pm - Pager - [phone]  After 6pm go to [email or web address] - password [email or web address] [facility]  Office  [phone]  CC: Primary care physician; Valerie Roys, DO  Note: This dictation was prepared with Dragon dictation along with smaller phrase technology. Any transcriptional errors that result from this process are unintentional.

## 2017-11-23 NOTE — Clinical Social Work Note (Signed)
Clinical Social Work Assessment  Patient Details  Name: Katherine [name] MRN: [id number] Date of Birth: 05/12/1940  Date of referral:  11/23/17               Reason for consult:  Facility Placement                Permission sought to share information with:  Facility Contact Representative Permission granted to share information::  Yes, Verbal Permission Granted  Name::      Skilled Nursing Facility   Agency::   [place]   Relationship::     Contact Information:     Housing/Transportation Living arrangements for the past 2 months:  Single Family Home Source of Information:  Patient, Spouse Patient Interpreter Needed:  None Criminal Activity/Legal Involvement Pertinent to Current Situation/Hospitalization:  No - Comment as needed Significant Relationships:  Adult Children, Spouse Lives with:  Spouse Do you feel safe going back to the place where you live?  Yes Need for family participation in patient care:  Yes (Comment)  Care giving concerns:  Patient lives in [place] with her husband Katherine [name].    Social Worker assessment / plan:  Clinical Social Worker (CSW) reviewed chart and noted that PT is recommending SNF. CSW met with patient alone at bedside to discuss D/C plan. Per patient she lives with her husband in [place] and prefers to D/C home. Per patient she was at [facility] recently for rehab. CSW explained that PT is recommending SNF again. CSW contacted patient's husband Katherine [name] and made him aware of above. Per Katherine [name] he would like for patient to return to [facility] because he will have a hard time taking care of her in the condition she is in. CSW made patient aware that her husband wants her to go back to SNF. Patient reported that she would go back to [facility]. FL2 complete and faxed out.   Per [facility] liaison they can accept patient and will start [employer] SNF authorization today. CSW made patient and her husband aware that [facility] can accept her and they are working on [employer] SNF authorization.  Patient accepted bed offer from [facility]. CSW will continue to follow and assist as needed.   Employment status:  Retired Insurance information:  Managed Medicare PT Recommendations:  Skilled Nursing Facility Information / Referral to community resources:  Skilled Nursing Facility  Patient/Family's Response to care:  Patient is agreeable to D/C to [facility].   Patient/Family's Understanding of and Emotional Response to Diagnosis, Current Treatment, and Prognosis:  Patient was very pleasant and thanked CSW for assistance.   Emotional Assessment Appearance:  Appears stated age Attitude/Demeanor/Rapport:    Affect (typically observed):  Accepting, Adaptable, Pleasant Orientation:  Oriented to Self, Oriented to Place, Oriented to  Time, Oriented to Situation Alcohol / Substance use:  Not Applicable Psych involvement (Current and /or in the community):  No (Comment)  Discharge Needs  Concerns to be addressed:  Discharge Planning Concerns Readmission within the last 30 days:  No Current discharge risk:  Dependent with Mobility Barriers to Discharge:  Continued Medical Work up   ,  M, LCSW 11/23/2017, 3:23 PM  

## 2017-11-23 NOTE — Plan of Care (Signed)

## 2017-11-23 NOTE — Evaluation (Signed)
Physical Therapy Evaluation Patient Details Name: Katherine [name] MRN: [id number] DOB: March 19, 1940 Today's Date: 11/23/2017   History of Present Illness  77yo female with multiple recent admits, d/t AMS, acute respiratory failure.  Now here with UTI, c/o b/l LE pain.  PMH: HTN, COPD, CKD3, GAD/depression, angioedema, HCAP, TIA.  Clinical Impression  Pt with shortness of breath t/o the session and despite being on 2 liters struggled to keep O2 in the 90s.  With both limited and labored bouts of ambulation her sats dropped to the low 80s and she had very labored, walker reliant and slow ambulation.  Pt c/o pain t/o the session in b/l ankles and knee and did not tolerate much mobility or strength testing well.  She is quite limited and though she was eager to go home, this likely would be very difficult and unsafe given her current limitations.     Follow Up Recommendations SNF(pt hoping to go home, recently at rehab)    Equipment Recommendations  None recommended by PT(has all necessary DME at home)    Recommendations for Other Services       Precautions / Restrictions Precautions Precautions: Fall Restrictions Weight Bearing Restrictions: No      Mobility  Bed Mobility Overal bed mobility: Needs Assistance             General bed mobility comments: Pt in recliner on arrival, nursing reports she was +2 to get up earlier today  Transfers Overall transfer level: Needs assistance Equipment used: Rolling walker (2 wheeled) Transfers: Sit to/from Stand Sit to Stand: Mod assist         General transfer comment: First time standing pt needed heavy mod assist, on second attempt she was able to maintain hand and foot placement t/o the effort and needed only min assist   Ambulation/Gait Ambulation/Gait assistance: Min assist Gait Distance (Feet): 6 Feet Assistive device: Rolling walker (2 wheeled)       General Gait Details: First attempt she managed only ~2 feet with very  labored effort, reliant on walker, drop in O2 from ~90 to 81% (on 2 liters), after seated rest break she was able to ambulate a little further with continued very slow and labored effort, 3 liters this time with similar drop in O2 to low 80s.  Stairs            Wheelchair Mobility    Modified Rankin (Stroke Patients Only)       Balance Overall balance assessment: Needs assistance   Sitting balance-Leahy Scale: Fair     Standing balance support: Bilateral upper extremity supported Standing balance-Leahy Scale: Fair Standing balance comment: reliant on walker, general unsteadiness with all tasks                             Pertinent Vitals/Pain Pain Assessment: 0-10 Pain Score: 6  Pain Location: b/l ankles R>L, L knee    Home Living Family/patient expects to be discharged to:: Private residence Living Arrangements: Spouse/significant other Available Help at Discharge: Family Type of Home: Apartment Home Access: Level entry   Entrance Stairs-Number of Steps: 0 Home Layout: One level Home Equipment: Walker - 2 wheels;Cane - single point;Bedside commode;Shower seat Additional Comments: Pt has done rehab recently, hopes to be able to go home    Prior Function Level of Independence: Needs assistance   Gait / Transfers Assistance Needed: RW for household distance AMB, has been relatively limited  Hand Dominance        Extremity/Trunk Assessment   Upper Extremity Assessment Upper Extremity Assessment: Generalized weakness    Lower Extremity Assessment Lower Extremity Assessment: Generalized weakness(pain sensitive to any palpation of ankles, limited ROM)       Communication   Communication: HOH  Cognition Arousal/Alertness: Awake/alert;Lethargic Behavior During Therapy: WFL for tasks assessed/performed Overall Cognitive Status: Within Functional Limits for tasks assessed                                         General Comments General comments (skin integrity, edema, etc.): Pt with labored breathing t/o the session, nearly constant c/o b/l ankle/knee pain    Exercises     Assessment/Plan    PT Assessment Patient needs continued PT services  PT Problem List Decreased strength;Decreased activity tolerance;Decreased balance;Decreased mobility;Cardiopulmonary status limiting activity;Decreased safety awareness;Decreased knowledge of precautions;Decreased knowledge of use of DME;Obesity       PT Treatment Interventions DME instruction;Gait training;Functional mobility training;Therapeutic activities;Balance training;Patient/family education;Therapeutic exercise    PT Goals (Current goals can be found in the Care Plan section)  Acute Rehab PT Goals Patient Stated Goal: return to home and continue to get stronger  PT Goal Formulation: With patient Time For Goal Achievement: 12/07/17 Potential to Achieve Goals: Fair    Frequency Min 2X/week   Barriers to discharge        Co-evaluation               AM-PAC PT "6 Clicks" Daily Activity  Outcome Measure Difficulty turning over in bed (including adjusting bedclothes, sheets and blankets)?: A Lot Difficulty moving from lying on back to sitting on the side of the bed? : Unable Difficulty sitting down on and standing up from a chair with arms (e.g., wheelchair, bedside commode, etc,.)?: Unable Help needed moving to and from a bed to chair (including a wheelchair)?: A Lot Help needed walking in hospital room?: A Lot Help needed climbing 3-5 steps with a railing? : Total 6 Click Score: 9    End of Session Equipment Utilized During Treatment: Oxygen Activity Tolerance: Patient limited by fatigue Patient left: with call bell/phone within reach;with chair alarm set Nurse Communication: Mobility status PT Visit Diagnosis: Unsteadiness on feet (R26.81);Difficulty in walking, not elsewhere classified (R26.2);Muscle weakness (generalized)  (M62.81)    Time: 7340-3709 PT Time Calculation (min) (ACUTE ONLY): 21 min   Charges:   PT Evaluation $PT Eval Low Complexity: 1 Low          Kreg Shropshire, DPT 11/23/2017, 11:13 AM

## 2017-11-23 NOTE — Clinical Social Work Placement (Signed)
   CLINICAL SOCIAL WORK PLACEMENT  NOTE  Date:  11/23/2017  Patient Details  Name: Katherine [name] MRN: [id number] Date of Birth: Jan 09, 1941  Clinical Social Work is seeking post-discharge placement for this patient at the [place] level of care (*CSW will initial, date and re-position this form in  chart as items are completed):  Yes   Patient/family provided with [facility] Work Department's list of facilities offering this level of care within the geographic area requested by the patient (or if unable, by the patient's family).  Yes   Patient/family informed of their freedom to choose among providers that offer the needed level of care, that participate in Medicare, Medicaid or managed care program needed by the patient, have an available bed and are willing to accept the patient.  Yes   Patient/family informed of [facility]'s ownership interest in [facility] and [facility], as well as of the fact that they are under no obligation to receive care at these facilities.  PASRR submitted to EDS on       PASRR number received on       Existing PASRR number confirmed on 11/23/17     FL2 transmitted to all facilities in geographic area requested by pt/family on 11/23/17     FL2 transmitted to all facilities within larger geographic area on       Patient informed that his/her managed care company has contracts with or will negotiate with certain facilities, including the following:        Yes   Patient/family informed of bed offers received.  Patient chooses bed at ([facility] )     Physician recommends and patient chooses bed at      Patient to be transferred to   on  .  Patient to be transferred to facility by       Patient family notified on   of transfer.  Name of family member notified:        PHYSICIAN       Additional Comment:    _______________________________________________ Crispin Vogel, Veronia Beets, LCSW 11/23/2017, 3:22 PM

## 2017-11-24 LAB — GLUCOSE, CAPILLARY
GLUCOSE-CAPILLARY: 176 mg/dL — AB (ref 70–99)
GLUCOSE-CAPILLARY: 171 mg/dL — AB (ref 70–99)
Glucose-Capillary: 167 mg/dL — ABNORMAL HIGH (ref 70–99)
Glucose-Capillary: 189 mg/dL — ABNORMAL HIGH (ref 70–99)
Glucose-Capillary: 169 mg/dL — ABNORMAL HIGH (ref 70–99)

## 2017-11-24 LAB — HEMOGLOBIN A1C
Hgb A1c MFr Bld: 8.1 % — ABNORMAL HIGH (ref 4.8–5.6)
MEAN PLASMA GLUCOSE: 185.77 mg/dL

## 2017-11-24 MED ORDER — HYDRALAZINE HCL 20 MG/ML IJ SOLN: 10.0000 mg | Freq: Four times a day (QID) | INTRAMUSCULAR | Status: DC | PRN

## 2017-11-24 MED ORDER — CEPHALEXIN 250 MG PO CAPS
250.0000 mg | ORAL_CAPSULE | Freq: Two times a day (BID) | ORAL | Status: DC
  Filled 2017-11-24: qty 1

## 2017-11-24 MED ORDER — OXYCODONE HCL 5 MG PO TABS
5.0000 mg | ORAL_TABLET | Freq: Three times a day (TID) | ORAL | Status: DC | PRN
  Administered 2017-11-24 – 2017-11-25 (×2): 5 mg via ORAL
  Filled 2017-11-24 (×2): qty 1

## 2017-11-24 MED ORDER — GABAPENTIN 300 MG PO CAPS
300.0000 mg | ORAL_CAPSULE | Freq: Every day | ORAL | Status: DC
  Administered 2017-11-24 – 2017-11-25 (×2): 300 mg via ORAL
  Filled 2017-11-24 (×2): qty 1

## 2017-11-24 MED ORDER — METHOCARBAMOL 500 MG PO TABS
500.0000 mg | ORAL_TABLET | Freq: Two times a day (BID) | ORAL | Status: DC | PRN
  Administered 2017-11-24: 500 mg via ORAL
  Filled 2017-11-24: qty 1

## 2017-11-24 NOTE — Significant Event (Signed)
Rapid Response Event Note  Overview: Time Called: 1483 Arrival Time: 1537 Event Type: Respiratory, Other (Comment)(HTN)  Initial Focused Assessment: called for rr in room 153, pt awake, alert in bed, tremors. RN and RRT in room, c/o low 02 sats and HTN.   Interventions: adjusted 02 sat monitor- sats 95-100 on 3L , notified [name] and put on tele box. Given scheduled HTN med and PRN anxiety med.  Plan of Care (if not transferred): Raquel Sarna to notift if further assistance needed.  Event Summary: Name of Physician Notified: [name] at 1545    at    Outcome: Stayed in room and stabalized  Event End Time: [place]

## 2017-11-24 NOTE — Progress Notes (Signed)
Responded to rapid response. Patient's pulse ox probe changed from finger to ear, due to pt tremors and saturations increased to 95% on 3lpm.

## 2017-11-24 NOTE — Progress Notes (Signed)
   11/24/17 1540  Clinical Encounter Type  Visited With Patient  Visit Type Initial;Spiritual support  Referral From Nurse  Consult/Referral To Chaplain  Spiritual Encounters  Spiritual Needs Prayer;Other (Comment)   Lawrence received a RR page for [name]. I reported to the patient's room and prayed silently for the patient and care team. Ms. Markert appeared to be alert and talking with the RR team. No family was present, I will follow up as needed.

## 2017-11-24 NOTE — Progress Notes (Signed)
[facility] [place] at [place] NAME: Katherine [name]    MR#:  [id number]  DATE OF BIRTH:  29-Dec-1940  SUBJECTIVE:   Altered mental status has improved.  Patient is poor historian. Complains of bilateral leg pain which is chronic Chronic garbled speech  REVIEW OF SYSTEMS:    Review of Systems  Constitutional: Positive for malaise/fatigue. Negative for chills and fever.  HENT: Negative for sore throat.   Eyes: Negative for blurred vision, double vision and pain.  Respiratory: Positive for cough. Negative for hemoptysis, shortness of breath and wheezing.   Cardiovascular: Negative for chest pain, palpitations, orthopnea and leg swelling.  Gastrointestinal: Negative for abdominal pain, constipation, diarrhea, heartburn, nausea and vomiting.  Genitourinary: Negative for dysuria and hematuria.  Musculoskeletal: Positive for joint pain. Negative for back pain.  Skin: Negative for rash.  Neurological: Negative for sensory change, speech change, focal weakness and headaches.  Endo/Heme/Allergies: Does not bruise/bleed easily.  Psychiatric/Behavioral: Negative for depression. The patient is not nervous/anxious.     DRUG ALLERGIES:   Allergies  Allergen Reactions  . Bee Venom Swelling  . Enalapril Maleate Swelling    VITALS:  Blood pressure 136/82, pulse 80, temperature 98.2 F (36.8 C), temperature source Axillary, resp. rate (!) 22, height 5\' 6"  (1.676 m), weight 113.4 kg, SpO2 97 %.  PHYSICAL EXAMINATION:   Physical Exam  GENERAL:  77 y.o.-year-old patient lying in the bed with no acute distress. Obese EYES: Pupils equal, round, reactive to light and accommodation. No scleral icterus. Extraocular muscles intact.  HEENT: Head atraumatic, normocephalic. Oropharynx and nasopharynx clear.  NECK:  Supple, no jugular venous distention. No thyroid enlargement, no tenderness.  LUNGS: Mild wheezing CARDIOVASCULAR: S1, S2 normal. No murmurs, rubs, or  gallops.  ABDOMEN: Soft, nontender, nondistended. Bowel sounds present. No organomegaly or mass.  EXTREMITIES: No cyanosis, clubbing.  Bilateral lower extremity edema NEUROLOGIC: Cranial nerves II through XII are intact. No focal Motor or sensory deficits b/l.   PSYCHIATRIC:  patient is alert and awake SKIN: No obvious rash, lesion, or ulcer.   LABORATORY PANEL:   CBC Recent Labs  Lab 11/21/17 1948  WBC 7.5  HGB 10.3*  HCT 33.8*  PLT 200   ------------------------------------------------------------------------------------------------------------------ Chemistries  Recent Labs  Lab 11/21/17 1948 11/22/17 0654 11/23/17 0511  NA 144  --  140  K 4.1  --  3.7  CL 104  --  103  CO2 32  --  31  GLUCOSE 161*  --  151*  BUN 46*  --  30*  CREATININE 2.42*  --  1.70*  CALCIUM 7.8*  --  7.8*  MG  --  1.5*  --   AST 18  --   --   ALT 27  --   --   ALKPHOS 62  --   --   BILITOT 0.3  --   --    ------------------------------------------------------------------------------------------------------------------  Cardiac Enzymes Recent Labs  Lab 11/20/17 2134  TROPONINI <0.03   ------------------------------------------------------------------------------------------------------------------  RADIOLOGY:  US Renal  Result Date: 11/22/2017 CLINICAL DATA:  Acute kidney injury. EXAM: RENAL / URINARY TRACT ULTRASOUND COMPLETE COMPARISON:  CT, 05/04/2017 FINDINGS: Right Kidney: Length: 9.5 cm. Mildly increased renal parenchymal echogenicity. Mild diffuse renal cortical thinning. No mass or stone. No hydronephrosis. Left Kidney: Length: 8.6 cm. Increased renal parenchymal echogenicity. Diffuse renal cortical thinning greater than that on the right. Cyst protrudes from the upper pole measuring 14 x 11 x 12 mm. No other renal masses. No  stones. No hydronephrosis. Bladder: Appears normal for degree of bladder distention. IMPRESSION: 1. No acute findings.  No hydronephrosis. 2. Increased renal  parenchymal echogenicity and renal cortical thinning, left greater than right, consistent with medical renal disease. 3. 14 mm left renal cyst. Electronically Signed   By: Lajean Manes M.D.   On: 11/22/2017 14:13     ASSESSMENT AND PLAN:  Katherine [name]  is a 77 y.o. female with a known history of morbid obesity, HTN, HLD, GERD p/w AMS, UTI. Pt is AAOx3; she can tell me she is in [place], and it is 2019, but she is not a particularly good historian. Her speech is difficult to understand. She tells me she lives in an apartment in [place] with her husband. She states that she was having "the shakes" and felt "weak all over".  *UTI.  On IV ceftriaxone.  Cultures negative No fever D/c keflex  *Bilateral leg pain.  Chronic neuropathy seems to be worsening.   -increased dose of neurontin making pt very sleepy Physical therapy consulted as patient is having difficulty walking -ultrasound Doppler negative for DVT. Patient has left popliteal cyst could be contributing some to pain  *COPD exacerbation on chronic oxygen Scheduled nebulizers.  Inhalers.  *Acute kidney injury over CKD stage III has resolved.  *Chronic diastolic congestive heart failure.  Restart home dose of Lasix now that creatinine is improved.  *DVT prophylaxis with Lovenox  Physical therapy recommending skilled nursing facility. Awaiting insurance authorization  All the records are reviewed and case discussed with Care Management/Social Workerr. Management plans discussed with the patient, family and they are in agreement.  CODE STATUS: FULL CODE  DVT Prophylaxis: SCDs  TOTAL TIME TAKING CARE OF THIS PATIENT: 25 minutes.   POSSIBLE D/C IN 1-2 DAYS, DEPENDING ON CLINICAL CONDITION.  [name] M.D on 11/24/2017 at 7:52 AM  Between 7am to 6pm - Pager - [phone]  After 6pm go to [email or web address] - password [email or web address] [facility]  Office  [phone]  CC: Primary care physician; Valerie Roys, DO  Note: This dictation was prepared with Dragon dictation along with smaller phrase technology. Any transcriptional errors that result from this process are unintentional.

## 2017-11-24 NOTE — Progress Notes (Signed)
Pt had 6 beat run of Vtach. Pt in no acute distress. VSS. Pt is lethargic but awakes to sound and touch. Denies pain. Heart monitor on pt. MD made aware, no new orders.

## 2017-11-25 LAB — GLUCOSE, CAPILLARY
GLUCOSE-CAPILLARY: 140 mg/dL — AB (ref 70–99)
GLUCOSE-CAPILLARY: 169 mg/dL — AB (ref 70–99)
GLUCOSE-CAPILLARY: 132 mg/dL — AB (ref 70–99)
Glucose-Capillary: 151 mg/dL — ABNORMAL HIGH (ref 70–99)

## 2017-11-25 NOTE — Progress Notes (Signed)
[facility] at [place] NAME: Katherine [name]    MR#:  [id number]  DATE OF BIRTH:  Apr 28, 1940  SUBJECTIVE:   Altered mental status has improved.  Patient is poor historian. Complains of bilateral leg pain which is chronic Chronic garbled speech No issues per Rn this am   REVIEW OF SYSTEMS:    Review of Systems  Constitutional: Positive for malaise/fatigue. Negative for chills and fever.  HENT: Negative for sore throat.   Eyes: Negative for blurred vision, double vision and pain.  Respiratory: Positive for cough. Negative for hemoptysis, shortness of breath and wheezing.   Cardiovascular: Negative for chest pain, palpitations, orthopnea and leg swelling.  Gastrointestinal: Negative for abdominal pain, constipation, diarrhea, heartburn, nausea and vomiting.  Genitourinary: Negative for dysuria and hematuria.  Musculoskeletal: Positive for joint pain. Negative for back pain.  Skin: Negative for rash.  Neurological: Negative for sensory change, speech change, focal weakness and headaches.  Endo/Heme/Allergies: Does not bruise/bleed easily.  Psychiatric/Behavioral: Negative for depression. The patient is not nervous/anxious.     DRUG ALLERGIES:   Allergies  Allergen Reactions  . Bee Venom Swelling  . Enalapril Maleate Swelling    VITALS:  Blood pressure 109/67, pulse 62, temperature 98.3 F (36.8 C), temperature source Oral, resp. rate (!) 24, height 5\' 6"  (1.676 m), weight 113.4 kg, SpO2 (!) 88 %.  PHYSICAL EXAMINATION:   Physical Exam  GENERAL:  77 y.o.-year-old patient lying in the bed with no acute distress. Obese EYES: Pupils equal, round, reactive to light and accommodation. No scleral icterus. Extraocular muscles intact.  HEENT: Head atraumatic, normocephalic. Oropharynx and nasopharynx clear.  NECK:  Supple, no jugular venous distention. No thyroid enlargement, no tenderness.  LUNGS: Mild wheezing CARDIOVASCULAR: S1, S2  normal. No murmurs, rubs, or gallops.  ABDOMEN: Soft, nontender, nondistended. Bowel sounds present. No organomegaly or mass.  EXTREMITIES: No cyanosis, clubbing.  Bilateral lower extremity edema NEUROLOGIC: Cranial nerves II through XII are intact. No focal Motor or sensory deficits b/l.   PSYCHIATRIC:  patient is alert and awake SKIN: No obvious rash, lesion, or ulcer.   LABORATORY PANEL:   CBC Recent Labs  Lab 11/21/17 1948  WBC 7.5  HGB 10.3*  HCT 33.8*  PLT 200   ------------------------------------------------------------------------------------------------------------------ Chemistries  Recent Labs  Lab 11/21/17 1948 11/22/17 0654 11/23/17 0511  NA 144  --  140  K 4.1  --  3.7  CL 104  --  103  CO2 32  --  31  GLUCOSE 161*  --  151*  BUN 46*  --  30*  CREATININE 2.42*  --  1.70*  CALCIUM 7.8*  --  7.8*  MG  --  1.5*  --   AST 18  --   --   ALT 27  --   --   ALKPHOS 62  --   --   BILITOT 0.3  --   --    ------------------------------------------------------------------------------------------------------------------  Cardiac Enzymes Recent Labs  Lab 11/20/17 2134  TROPONINI <0.03   ------------------------------------------------------------------------------------------------------------------  RADIOLOGY:  No results found.   ASSESSMENT AND PLAN:  Katherine [name]  is a 77 y.o. female with a known history of morbid obesity, HTN, HLD, GERD p/w AMS, UTI. Pt is AAOx3; she can tell me she is in [place], and it is 2019, but she is not a particularly good historian. Her speech is difficult to understand. She tells me she lives in an apartment in [place] with her husband. She states  that she was having "the shakes" and felt "weak all over".  *UTI.   -On IV ceftriaxone.  Cultures negative No fever D/c keflex  *Bilateral leg pain suspected Chronic neuropathy -increased dose of neurontin making pt very sleepy--decreased dose to at bedtime Physical therapy  consulted as patient is having difficulty walking -ultrasound Doppler negative for DVT. Patient has left popliteal cyst could be contributing some to pain  *COPD exacerbation on chronic oxygen Scheduled nebulizers.  Inhalers.  *Acute kidney injury over CKD stage III has resolved.  *Chronic diastolic congestive heart failure.  Restart home dose of Lasix now that creatinine is improved.  *DVT prophylaxis with Lovenox  Physical therapy recommending skilled nursing facility. Awaiting insurance authorization To rehab tomorrow if remains stable  All the records are reviewed and case discussed with Care Management/Social Workerr. Management plans discussed with the patient, family and they are in agreement.  CODE STATUS: FULL CODE  DVT Prophylaxis: SCDs  TOTAL TIME TAKING CARE OF THIS PATIENT: 25 minutes.   POSSIBLE D/C IN 1-2 DAYS, DEPENDING ON CLINICAL CONDITION.  [name] M.D on 11/25/2017 at 8:46 AM  Between 7am to 6pm - Pager - [phone]  After 6pm go to [email or web address] - password [email or web address] [facility]  Office  [phone]  CC: Primary care physician; Valerie Roys, DO  Note: This dictation was prepared with Dragon dictation along with smaller phrase technology. Any transcriptional errors that result from this process are unintentional.

## 2017-11-25 NOTE — Progress Notes (Signed)
Pt is in no acute distress. Pt has tremor at baseline. Oxygen saturation 89-90, best waveforms while O2 saturation probe on earlobe. VSS. Will continue to monitor.

## 2017-11-25 NOTE — Progress Notes (Signed)
Pt received tylenol 650 mgm for fever 101.3

## 2017-11-26 DIAGNOSIS — E875 Hyperkalemia: Secondary | ICD-10-CM | POA: Diagnosis not present

## 2017-11-26 DIAGNOSIS — R0602 Shortness of breath: Secondary | ICD-10-CM | POA: Diagnosis not present

## 2017-11-26 DIAGNOSIS — F3289 Other specified depressive episodes: Secondary | ICD-10-CM | POA: Diagnosis not present

## 2017-11-26 DIAGNOSIS — E1122 Type 2 diabetes mellitus with diabetic chronic kidney disease: Secondary | ICD-10-CM | POA: Diagnosis present

## 2017-11-26 DIAGNOSIS — J441 Chronic obstructive pulmonary disease with (acute) exacerbation: Secondary | ICD-10-CM | POA: Diagnosis not present

## 2017-11-26 DIAGNOSIS — J80 Acute respiratory distress syndrome: Secondary | ICD-10-CM | POA: Diagnosis not present

## 2017-11-26 DIAGNOSIS — E1165 Type 2 diabetes mellitus with hyperglycemia: Secondary | ICD-10-CM | POA: Diagnosis present

## 2017-11-26 DIAGNOSIS — Z6841 Body Mass Index (BMI) 40.0 and over, adult: Secondary | ICD-10-CM | POA: Diagnosis not present

## 2017-11-26 DIAGNOSIS — I10 Essential (primary) hypertension: Secondary | ICD-10-CM | POA: Diagnosis not present

## 2017-11-26 DIAGNOSIS — F419 Anxiety disorder, unspecified: Secondary | ICD-10-CM | POA: Diagnosis not present

## 2017-11-26 DIAGNOSIS — E7849 Other hyperlipidemia: Secondary | ICD-10-CM | POA: Diagnosis not present

## 2017-11-26 DIAGNOSIS — F064 Anxiety disorder due to known physiological condition: Secondary | ICD-10-CM | POA: Diagnosis not present

## 2017-11-26 DIAGNOSIS — Z7902 Long term (current) use of antithrombotics/antiplatelets: Secondary | ICD-10-CM | POA: Diagnosis not present

## 2017-11-26 DIAGNOSIS — M79661 Pain in right lower leg: Secondary | ICD-10-CM | POA: Diagnosis not present

## 2017-11-26 DIAGNOSIS — Z9911 Dependence on respirator [ventilator] status: Secondary | ICD-10-CM | POA: Diagnosis not present

## 2017-11-26 DIAGNOSIS — M79662 Pain in left lower leg: Secondary | ICD-10-CM | POA: Diagnosis not present

## 2017-11-26 DIAGNOSIS — N183 Chronic kidney disease, stage 3 (moderate): Secondary | ICD-10-CM | POA: Diagnosis not present

## 2017-11-26 DIAGNOSIS — K219 Gastro-esophageal reflux disease without esophagitis: Secondary | ICD-10-CM | POA: Diagnosis not present

## 2017-11-26 DIAGNOSIS — E119 Type 2 diabetes mellitus without complications: Secondary | ICD-10-CM | POA: Diagnosis not present

## 2017-11-26 DIAGNOSIS — K4190 Unilateral femoral hernia, without obstruction or gangrene, not specified as recurrent: Secondary | ICD-10-CM | POA: Diagnosis not present

## 2017-11-26 DIAGNOSIS — K5909 Other constipation: Secondary | ICD-10-CM | POA: Diagnosis present

## 2017-11-26 DIAGNOSIS — R0603 Acute respiratory distress: Secondary | ICD-10-CM | POA: Diagnosis not present

## 2017-11-26 DIAGNOSIS — F329 Major depressive disorder, single episode, unspecified: Secondary | ICD-10-CM | POA: Diagnosis not present

## 2017-11-26 DIAGNOSIS — Z96651 Presence of right artificial knee joint: Secondary | ICD-10-CM | POA: Diagnosis present

## 2017-11-26 DIAGNOSIS — Z8673 Personal history of transient ischemic attack (TIA), and cerebral infarction without residual deficits: Secondary | ICD-10-CM | POA: Diagnosis not present

## 2017-11-26 DIAGNOSIS — J9621 Acute and chronic respiratory failure with hypoxia: Secondary | ICD-10-CM | POA: Diagnosis not present

## 2017-11-26 DIAGNOSIS — E785 Hyperlipidemia, unspecified: Secondary | ICD-10-CM | POA: Diagnosis present

## 2017-11-26 DIAGNOSIS — R569 Unspecified convulsions: Secondary | ICD-10-CM | POA: Diagnosis present

## 2017-11-26 DIAGNOSIS — I2510 Atherosclerotic heart disease of native coronary artery without angina pectoris: Secondary | ICD-10-CM | POA: Diagnosis not present

## 2017-11-26 DIAGNOSIS — I130 Hypertensive heart and chronic kidney disease with heart failure and stage 1 through stage 4 chronic kidney disease, or unspecified chronic kidney disease: Secondary | ICD-10-CM | POA: Diagnosis not present

## 2017-11-26 DIAGNOSIS — I509 Heart failure, unspecified: Secondary | ICD-10-CM | POA: Diagnosis present

## 2017-11-26 DIAGNOSIS — E1121 Type 2 diabetes mellitus with diabetic nephropathy: Secondary | ICD-10-CM | POA: Diagnosis not present

## 2017-11-26 DIAGNOSIS — N390 Urinary tract infection, site not specified: Secondary | ICD-10-CM | POA: Diagnosis not present

## 2017-11-26 DIAGNOSIS — M6281 Muscle weakness (generalized): Secondary | ICD-10-CM | POA: Diagnosis not present

## 2017-11-26 DIAGNOSIS — N179 Acute kidney failure, unspecified: Secondary | ICD-10-CM | POA: Diagnosis not present

## 2017-11-26 DIAGNOSIS — J9811 Atelectasis: Secondary | ICD-10-CM | POA: Diagnosis not present

## 2017-11-26 DIAGNOSIS — Z66 Do not resuscitate: Secondary | ICD-10-CM | POA: Diagnosis present

## 2017-11-26 DIAGNOSIS — J449 Chronic obstructive pulmonary disease, unspecified: Secondary | ICD-10-CM | POA: Diagnosis not present

## 2017-11-26 DIAGNOSIS — E559 Vitamin D deficiency, unspecified: Secondary | ICD-10-CM | POA: Diagnosis present

## 2017-11-26 LAB — GLUCOSE, CAPILLARY
GLUCOSE-CAPILLARY: 146 mg/dL — AB (ref 70–99)
Glucose-Capillary: 126 mg/dL — ABNORMAL HIGH (ref 70–99)

## 2017-11-26 MED ORDER — FLEET ENEMA 7-19 GM/118ML RE ENEM
1.0000 | ENEMA | Freq: Every day | RECTAL | Status: DC | PRN
  Administered 2017-11-26: 1 via RECTAL
  Filled 2017-11-26: qty 1

## 2017-11-26 NOTE — Discharge Summary (Signed)
Katherine [name] at [place] NAME: Katherine [name]    MR#:  [id number]  DATE OF BIRTH:  [birth year]  DATE OF ADMISSION:  11/21/2017 ADMITTING PHYSICIAN: Arta Silence, MD  DATE OF DISCHARGE: 11/26/2017  PRIMARY CARE PHYSICIAN: Valerie Roys, DO    ADMISSION DIAGNOSIS:  AKI (acute kidney injury) ([facility]) [N17.9]  DISCHARGE DIAGNOSIS:  acute on chronic respiratory failure secondary to acute on chronic COPD exacerbation chronic kidney disease stage III tonic respiratory failure with chronic oxygen requirement SECONDARY DIAGNOSIS:   Past Medical History:  Diagnosis Date  . Angioedema   . Anxiety   . Anxiety and depression   . CHF (congestive heart failure) ([place])   . Chronic constipation   . Chronic kidney disease    stage 3  . COPD (chronic obstructive pulmonary disease) ([place])   . Gallstones   . GERD (gastroesophageal reflux disease)   . Hypertension   . Left ventricular hypertrophy   . Osteoarthritis   . Prurigo nodularis   . Seizures ([place])   . Stroke ([place])   . Tobacco abuse   . Vitamin D deficiency disease     HOSPITAL COURSE:   CaroleneJonesis a77 y.o.femalewith a known history of morbid obesity, HTN, HLD, GERD p/w AMS, UTI. Pt is AAOx3; she can tell me she is in [place], and it is 2019, but she is not a particularly good historian. Her speech is difficult to understand. She tells me she lives in an apartment in [place] with her husband. She states that she was having "the shakes" and felt "weak all over".  *Acute on chronic respiratory failure secondary to acute on chronic COPD exacerbation on chronic oxygen Scheduled nebulizers.  Inhalers. Patient currently not wheezing. Continue oxygen to maintain sats 88 and above.  *UTI.   -was On IV ceftriaxone.  Cultures negative No fever D/c antibiotics  *Bilateral leg pain suspected Chronic neuropathy -increased dose of neurontin making pt very sleepy--decreased  dose to at bedtime Physical therapy consulted as patient is having difficulty walking -ultrasound Doppler negative for DVT. Patient has left popliteal cyst could be contributing some to pain  *Acute kidney injury over CKD stage III has resolved. -Patient did receive IV fluids.  *Chronic diastolic congestive heart failure.   Restart home dose of Lasix now that creatinine is improved.  *DVT prophylaxis with Lovenox  Physical therapy recommending skilled nursing facility. patient will discharged today once insurance authorization available   Patient is at a very high risk of readmission given her multitude of problems.  CONSULTS OBTAINED:  Treatment Team:  Arta Silence, MD  DRUG ALLERGIES:   Allergies  Allergen Reactions  . Bee Venom Swelling  . Enalapril Maleate Swelling    DISCHARGE MEDICATIONS:   Allergies as of 11/26/2017      Reactions   Bee Venom Swelling   Enalapril Maleate Swelling      Medication List    TAKE these medications   acetaminophen 650 MG CR tablet Commonly known as:  TYLENOL Take 650-1,300 mg by mouth every 8 (eight) hours as needed for pain.   albuterol 108 (90 Base) MCG/ACT inhaler Commonly known as:  PROVENTIL HFA;VENTOLIN HFA Inhale 2 puffs into the lungs every 4 (four) hours as needed for wheezing or shortness of breath.   albuterol 0.63 MG/3ML nebulizer solution Commonly known as:  ACCUNEB Take 3 mLs by nebulization 3 (three) times daily as needed for wheezing.   ALPRAZolam 0.5 MG tablet Commonly known as:  Duanne Moron  Take 0.5 mg by mouth 2 (two) times daily as needed for anxiety.   atorvastatin 80 MG tablet Commonly known as:  LIPITOR Take 80 mg by mouth daily.   cetirizine 10 MG tablet Commonly known as:  ZYRTEC Take 5 mg by mouth at bedtime.   cholecalciferol 1000 units tablet Commonly known as:  VITAMIN D Take 1,000 Units by mouth daily.   clopidogrel 75 MG tablet Commonly known as:  PLAVIX Take 75 mg by mouth  daily.   diltiazem 120 MG 12 hr capsule Commonly known as:  CARDIZEM SR Take 120 mg by mouth 2 (two) times daily.   docusate sodium 100 MG capsule Commonly known as:  COLACE Take 100 mg by mouth daily.   FLUoxetine 10 MG capsule Commonly known as:  PROZAC Take 10 mg by mouth daily.   Fluticasone-Umeclidin-Vilant 100-62.5-25 MCG/INH Aepb Inhale 1 puff into the lungs daily.   furosemide 20 MG tablet Commonly known as:  LASIX Take 1 tablet (20 mg total) by mouth daily.   gabapentin 300 MG capsule Commonly known as:  NEURONTIN Take 300 mg by mouth at bedtime.   guaiFENesin 600 MG 12 hr tablet Commonly known as:  MUCINEX Take 1 tablet (600 mg total) by mouth 2 (two) times daily.   hydrALAZINE 25 MG tablet Commonly known as:  APRESOLINE Take 25 mg by mouth 3 (three) times daily.   ipratropium 0.03 % nasal spray Commonly known as:  ATROVENT Place 2 sprays into both nostrils every 12 (twelve) hours.   ipratropium-albuterol 0.5-2.5 (3) MG/3ML Soln Commonly known as:  DUONEB Take 3 mLs by nebulization every 6 (six) hours as needed (shortness of breath).   levETIRAcetam 500 MG 24 hr tablet Commonly known as:  KEPPRA XR Take 1 tablet (500 mg total) by mouth daily.   linagliptin 5 MG Tabs tablet Commonly known as:  TRADJENTA Take 5 mg by mouth daily.   meclizine 25 MG tablet Commonly known as:  ANTIVERT Take 25 mg by mouth 2 (two) times daily as needed for dizziness.   methocarbamol 500 MG tablet Commonly known as:  ROBAXIN Take 500 mg by mouth every 8 (eight) hours as needed for muscle spasms.   pantoprazole 40 MG tablet Commonly known as:  PROTONIX Take 40 mg by mouth daily.   tiotropium 18 MCG inhalation capsule Commonly known as:  SPIRIVA Place 18 mcg into inhaler and inhale daily.       If you experience worsening of your admission symptoms, develop shortness of breath, life threatening emergency, suicidal or homicidal thoughts you must seek medical  attention immediately by calling 911 or calling your MD immediately  if symptoms less severe.  You Must read complete instructions/literature along with all the possible adverse reactions/side effects for all the Medicines you take and that have been prescribed to you. Take any new Medicines after you have completely understood and accept all the possible adverse reactions/side effects.   Please note  You were cared for by a hospitalist during your hospital stay. If you have any questions about your discharge medications or the care you received while you were in the hospital after you are discharged, you can call the unit and asked to speak with the hospitalist on call if the hospitalist that took care of you is not available. Once you are discharged, your primary care physician will handle any further medical issues. Please note that NO REFILLS for any discharge medications will be authorized once you are discharged, as it is  imperative that you return to your primary care physician (or establish a relationship with a primary care physician if you do not have one) for your aftercare needs so that they can reassess your need for medications and monitor your lab values. Today   SUBJECTIVE   No new complaints  VITAL SIGNS:  Blood pressure 110/69, pulse (!) 101, temperature 99.9 F (37.7 C), temperature source Oral, resp. rate (!) 22, height 5\' 6"  (1.676 m), weight 113.4 kg, SpO2 93 %.  I/O:    Intake/Output Summary (Last 24 hours) at 11/26/2017 0738 Last data filed at 11/25/2017 1801 Gross per 24 hour  Intake 480 ml  Output 1600 ml  Net -1120 ml    PHYSICAL EXAMINATION:  GENERAL:  77 y.o.-year-old patient lying in the bed with no acute distress. Morbid obesity EYES: Pupils equal, round, reactive to light and accommodation. No scleral icterus. Extraocular muscles intact.  HEENT: Head atraumatic, normocephalic. Oropharynx and nasopharynx clear.  NECK:  Supple, no jugular venous  distention. No thyroid enlargement, no tenderness.  LUNGS: decreased breath sounds bilaterally, no wheezing, rales,rhonchi or crepitation. No use of accessory muscles of respiration.  CARDIOVASCULAR: S1, S2 normal. No murmurs, rubs, or gallops.  ABDOMEN: Soft, non-tender, non-distended. Bowel sounds present. No organomegaly or mass.  EXTREMITIES: No pedal edema, cyanosis, or clubbing.  NEUROLOGIC: Cranial nerves II through XII are intact. Muscle strength 5/5 in all extremities. Sensation intact. Gait not checked.  PSYCHIATRIC: The patient is alert and oriented x 3.  SKIN: No obvious rash, lesion, or ulcer.   DATA REVIEW:   CBC  Recent Labs  Lab 11/21/17 1948  WBC 7.5  HGB 10.3*  HCT 33.8*  PLT 200    Chemistries  Recent Labs  Lab 11/21/17 1948 11/22/17 0654 11/23/17 0511  NA 144  --  140  K 4.1  --  3.7  CL 104  --  103  CO2 32  --  31  GLUCOSE 161*  --  151*  BUN 46*  --  30*  CREATININE 2.42*  --  1.70*  CALCIUM 7.8*  --  7.8*  MG  --  1.5*  --   AST 18  --   --   ALT 27  --   --   ALKPHOS 62  --   --   BILITOT 0.3  --   --     Microbiology Results   Recent Results (from the past 240 hour(s))  Urine culture     Status: None   Collection Time: 11/22/17 12:50 AM  Result Value Ref Range Status   Specimen Description   Final    URINE, CLEAN CATCH Performed at [facility], [address], [place], [place] [address]    Special Requests   Final    NONE Performed at [facility], [address] [place], [place] [address]    Culture   Final    NO GROWTH Performed at [facility], [place] [address], [place], [place] [address]    Report Status 11/23/2017 FINAL  Final    RADIOLOGY:  No results found.   Management plans discussed with the patient, family and they are in agreement.  CODE STATUS:     Code Status Orders  (From admission, onward)         Start     Ordered   11/22/17 0408  Full code  Continuous     11/22/17 0407         Code Status  History    Date Active Date Inactive Code Status Order ID Comments User Context   11/10/2017 1840 11/13/2017 1653 Full Code [id number]  Sela Hua, MD Inpatient   10/24/2017 0247 10/29/2017 2301 Full Code [id number]  Lance Coon, MD Inpatient   10/04/2017 1005 10/10/2017 1813 Full Code [id number]  Estill Bakes Inpatient   10/01/2017 0819 10/04/2017 0904 Full Code [id number]  Lolita Cram, RN Inpatient   09/19/2017 1306 09/22/2017 1622 Full Code [id number]  Gorden Harms, MD Inpatient   09/11/2017 2021 09/15/2017 1510 Full Code [id number]  Gorden Harms, MD Inpatient   08/27/2017 1543 08/29/2017 2159 Full Code [id number]  Hillary Bow, MD ED   06/28/2017 1414 06/30/2017 1748 Full Code [id number]  Salary, Avel Peace, MD Inpatient   06/12/2017 0858 06/16/2017 1659 DNR [id number]  Demetrios Loll, MD Inpatient   05/26/2017 1732 05/30/2017 1446 DNR [id number]  Hillary Bow, MD ED   05/17/2017 1228 05/21/2017 1511 DNR [id number]  Asencion Gowda, NP Inpatient   04/21/2017 0117 04/22/2017 1750 DNR [id number]  Lance Coon, MD Inpatient   04/05/2017 1411 04/08/2017 2101 Full Code [id number]  Saundra Shelling, MD Inpatient   02/08/2016 1414 02/11/2016 1729 Full Code [id number]  Hessie Knows, MD Inpatient   11/27/2015 1645 11/29/2015 1628 Full Code [id number]  Idelle Crouch, MD Inpatient   02/10/2015 2122 02/12/2015 1809 Full Code [id number]  Lytle Butte, MD ED   09/16/2014 1220 09/19/2014 1736 Full Code [id number]  Aldean Jewett, MD Inpatient      TOTAL TIME TAKING CARE OF THIS PATIENT: 40  minutes.    [name] M.D on 11/26/2017 at 7:38 AM  Between 7am to 6pm - Pager - [phone] After 6pm go to [email or web address] - password [email or web address] [facility]  Office  [phone]  CC: Primary care physician; Valerie Roys, DO

## 2017-11-26 NOTE — Progress Notes (Signed)
Patient is medically stable for D/C to [facility] today. Per [facility] authorization has been received and patient can come today to room 808. RN will call report and arrange EMS for transport. Clinical Education officer, museum (CSW) sent D/C orders to [facility] via [facility]. [facility] representative is aware of D/C today. Patient is aware of above. CSW contacted patient's husband [name] and made him aware of above. Please reconsult if future social work needs arise. CSW signing off.   [employer], LCSW [phone]

## 2017-11-26 NOTE — Care Management (Signed)
RNCM notified [place] with [facility] home health that plan is for patient to go to [facility] today.

## 2017-11-26 NOTE — Progress Notes (Signed)
Called report to [place] @ [facility]. Answered all questions. EMS called for transport

## 2017-11-26 NOTE — Care Management Important Message (Signed)
Copy of signed IM left with patient in room.  

## 2017-11-26 NOTE — Clinical Social Work Placement (Signed)
   CLINICAL SOCIAL WORK PLACEMENT  NOTE  Date:  11/26/2017  Patient Details  Name: Katherine [name] MRN: [id number] Date of Birth: 1940-04-05  Clinical Social Work is seeking post-discharge placement for this patient at the [place] level of care (*CSW will initial, date and re-position this form in  chart as items are completed):  Yes   Patient/family provided with [facility] Work Department's list of facilities offering this level of care within the geographic area requested by the patient (or if unable, by the patient's family).  Yes   Patient/family informed of their freedom to choose among providers that offer the needed level of care, that participate in Medicare, Medicaid or managed care program needed by the patient, have an available bed and are willing to accept the patient.  Yes   Patient/family informed of [facility]'s ownership interest in [facility] and [facility], as well as of the fact that they are under no obligation to receive care at these facilities.  PASRR submitted to EDS on       PASRR number received on       Existing PASRR number confirmed on 11/23/17     FL2 transmitted to all facilities in geographic area requested by pt/family on 11/23/17     FL2 transmitted to all facilities within larger geographic area on       Patient informed that his/her managed care company has contracts with or will negotiate with certain facilities, including the following:        Yes   Patient/family informed of bed offers received.  Patient chooses bed at ([facility] )     Physician recommends and patient chooses bed at      Patient to be transferred to ([facility] ) on 11/26/17.  Patient to be transferred to facility by [facility] )     Patient family notified on 11/26/17 of transfer.  Name of family member notified:  (Patient's husband [name] is aware of D/C today. )     PHYSICIAN       Additional Comment:     _______________________________________________ Jaiyah Beining, Veronia Beets, LCSW 11/26/2017, 9:34 AM

## 2017-11-27 DIAGNOSIS — M79661 Pain in right lower leg: Secondary | ICD-10-CM | POA: Diagnosis not present

## 2017-11-27 DIAGNOSIS — I10 Essential (primary) hypertension: Secondary | ICD-10-CM | POA: Diagnosis not present

## 2017-11-27 DIAGNOSIS — K4190 Unilateral femoral hernia, without obstruction or gangrene, not specified as recurrent: Secondary | ICD-10-CM | POA: Diagnosis not present

## 2017-11-27 DIAGNOSIS — E119 Type 2 diabetes mellitus without complications: Secondary | ICD-10-CM | POA: Diagnosis not present

## 2017-11-27 DIAGNOSIS — K219 Gastro-esophageal reflux disease without esophagitis: Secondary | ICD-10-CM | POA: Diagnosis not present

## 2017-11-27 DIAGNOSIS — M79662 Pain in left lower leg: Secondary | ICD-10-CM | POA: Diagnosis not present

## 2017-11-27 DIAGNOSIS — J449 Chronic obstructive pulmonary disease, unspecified: Secondary | ICD-10-CM | POA: Diagnosis not present

## 2017-11-28 ENCOUNTER — Telehealth: Payer: Self-pay

## 2017-11-28 NOTE — Telephone Encounter (Signed)
Agree with below. Addendum:    I believe that she has limits with moving and including, toileting, bathing, feeding, dressing and grooming. I believe the power wheelchair is needed for pt to be able to perform ADL's in her home

## 2017-11-28 NOTE — Telephone Encounter (Signed)
Called patients husband Bennie Hind to confirm [facility] went to [facility] after hospital discharge. [name] confirmed pt is residing at [facility] currently. Informed him patient does not need to come for hospital follow up on 12/04/2017 but she will need to be seen at office when she is discharged from [facility]. [name] will call when patient is discharged.

## 2017-11-29 ENCOUNTER — Other Ambulatory Visit: Payer: Self-pay

## 2017-11-29 ENCOUNTER — Encounter: Payer: Self-pay | Admitting: *Deleted

## 2017-11-29 ENCOUNTER — Emergency Department: Payer: Medicare HMO

## 2017-11-29 ENCOUNTER — Inpatient Hospital Stay
Admission: EM | Admit: 2017-11-29 | Discharge: 2017-12-05 | DRG: 190 | Disposition: A | Discharge status: Skilled Nursing Facility | Payer: Medicare HMO | Attending: Specialist | Admitting: Specialist

## 2017-11-29 DIAGNOSIS — N179 Acute kidney failure, unspecified: Secondary | ICD-10-CM | POA: Diagnosis present

## 2017-11-29 DIAGNOSIS — E785 Hyperlipidemia, unspecified: Secondary | ICD-10-CM | POA: Diagnosis present

## 2017-11-29 DIAGNOSIS — K219 Gastro-esophageal reflux disease without esophagitis: Secondary | ICD-10-CM | POA: Diagnosis present

## 2017-11-29 DIAGNOSIS — N183 Chronic kidney disease, stage 3 (moderate): Secondary | ICD-10-CM | POA: Diagnosis not present

## 2017-11-29 DIAGNOSIS — K5909 Other constipation: Secondary | ICD-10-CM | POA: Diagnosis present

## 2017-11-29 DIAGNOSIS — E559 Vitamin D deficiency, unspecified: Secondary | ICD-10-CM | POA: Diagnosis present

## 2017-11-29 DIAGNOSIS — J9811 Atelectasis: Secondary | ICD-10-CM | POA: Diagnosis not present

## 2017-11-29 DIAGNOSIS — Z9911 Dependence on respirator [ventilator] status: Secondary | ICD-10-CM | POA: Diagnosis not present

## 2017-11-29 DIAGNOSIS — R0603 Acute respiratory distress: Secondary | ICD-10-CM | POA: Diagnosis not present

## 2017-11-29 DIAGNOSIS — I509 Heart failure, unspecified: Secondary | ICD-10-CM | POA: Diagnosis present

## 2017-11-29 DIAGNOSIS — Z66 Do not resuscitate: Secondary | ICD-10-CM | POA: Diagnosis present

## 2017-11-29 DIAGNOSIS — Z6841 Body Mass Index (BMI) 40.0 and over, adult: Secondary | ICD-10-CM | POA: Diagnosis not present

## 2017-11-29 DIAGNOSIS — Z7902 Long term (current) use of antithrombotics/antiplatelets: Secondary | ICD-10-CM

## 2017-11-29 DIAGNOSIS — M6281 Muscle weakness (generalized): Secondary | ICD-10-CM | POA: Diagnosis not present

## 2017-11-29 DIAGNOSIS — T380X5A Adverse effect of glucocorticoids and synthetic analogues, initial encounter: Secondary | ICD-10-CM | POA: Diagnosis not present

## 2017-11-29 DIAGNOSIS — Z96651 Presence of right artificial knee joint: Secondary | ICD-10-CM | POA: Diagnosis present

## 2017-11-29 DIAGNOSIS — J9621 Acute and chronic respiratory failure with hypoxia: Secondary | ICD-10-CM | POA: Diagnosis not present

## 2017-11-29 DIAGNOSIS — Z8673 Personal history of transient ischemic attack (TIA), and cerebral infarction without residual deficits: Secondary | ICD-10-CM

## 2017-11-29 DIAGNOSIS — I2510 Atherosclerotic heart disease of native coronary artery without angina pectoris: Secondary | ICD-10-CM | POA: Diagnosis present

## 2017-11-29 DIAGNOSIS — E7849 Other hyperlipidemia: Secondary | ICD-10-CM | POA: Diagnosis not present

## 2017-11-29 DIAGNOSIS — J449 Chronic obstructive pulmonary disease, unspecified: Secondary | ICD-10-CM | POA: Diagnosis not present

## 2017-11-29 DIAGNOSIS — E1122 Type 2 diabetes mellitus with diabetic chronic kidney disease: Secondary | ICD-10-CM | POA: Diagnosis present

## 2017-11-29 DIAGNOSIS — J441 Chronic obstructive pulmonary disease with (acute) exacerbation: Principal | ICD-10-CM | POA: Diagnosis present

## 2017-11-29 DIAGNOSIS — F419 Anxiety disorder, unspecified: Secondary | ICD-10-CM | POA: Diagnosis not present

## 2017-11-29 DIAGNOSIS — R0600 Dyspnea, unspecified: Secondary | ICD-10-CM | POA: Diagnosis not present

## 2017-11-29 DIAGNOSIS — R569 Unspecified convulsions: Secondary | ICD-10-CM | POA: Diagnosis present

## 2017-11-29 DIAGNOSIS — E875 Hyperkalemia: Secondary | ICD-10-CM | POA: Diagnosis present

## 2017-11-29 DIAGNOSIS — F329 Major depressive disorder, single episode, unspecified: Secondary | ICD-10-CM | POA: Diagnosis present

## 2017-11-29 DIAGNOSIS — E1165 Type 2 diabetes mellitus with hyperglycemia: Secondary | ICD-10-CM | POA: Diagnosis present

## 2017-11-29 DIAGNOSIS — D631 Anemia in chronic kidney disease: Secondary | ICD-10-CM | POA: Diagnosis present

## 2017-11-29 DIAGNOSIS — Z9119 Patient's noncompliance with other medical treatment and regimen: Secondary | ICD-10-CM

## 2017-11-29 DIAGNOSIS — E119 Type 2 diabetes mellitus without complications: Secondary | ICD-10-CM

## 2017-11-29 DIAGNOSIS — Z91030 Bee allergy status: Secondary | ICD-10-CM

## 2017-11-29 DIAGNOSIS — R0602 Shortness of breath: Secondary | ICD-10-CM | POA: Diagnosis not present

## 2017-11-29 DIAGNOSIS — J984 Other disorders of lung: Secondary | ICD-10-CM | POA: Diagnosis not present

## 2017-11-29 DIAGNOSIS — G4089 Other seizures: Secondary | ICD-10-CM | POA: Diagnosis not present

## 2017-11-29 DIAGNOSIS — I130 Hypertensive heart and chronic kidney disease with heart failure and stage 1 through stage 4 chronic kidney disease, or unspecified chronic kidney disease: Secondary | ICD-10-CM | POA: Diagnosis not present

## 2017-11-29 DIAGNOSIS — G4733 Obstructive sleep apnea (adult) (pediatric): Secondary | ICD-10-CM | POA: Diagnosis not present

## 2017-11-29 DIAGNOSIS — Z87891 Personal history of nicotine dependence: Secondary | ICD-10-CM

## 2017-11-29 DIAGNOSIS — Z888 Allergy status to other drugs, medicaments and biological substances status: Secondary | ICD-10-CM

## 2017-11-29 LAB — BASIC METABOLIC PANEL
ANION GAP: 9 (ref 5–15)
BUN: 40 mg/dL — ABNORMAL HIGH (ref 8–23)
CALCIUM: 8.8 mg/dL — AB (ref 8.9–10.3)
CO2: 29 mmol/L (ref 22–32)
Chloride: 96 mmol/L — ABNORMAL LOW (ref 98–111)
Creatinine, Ser: 2.38 mg/dL — ABNORMAL HIGH (ref 0.44–1.00)
GFR calc non Af Amer: 19 mL/min — ABNORMAL LOW (ref >60)
GFR, EST AFRICAN AMERICAN: 22 mL/min — AB (ref >60)
Glucose, Bld: 141 mg/dL — ABNORMAL HIGH (ref 70–99)
Potassium: 5.6 mmol/L — ABNORMAL HIGH (ref 3.5–5.1)
Sodium: 134 mmol/L — ABNORMAL LOW (ref 135–145)

## 2017-11-29 LAB — BRAIN NATRIURETIC PEPTIDE: B Natriuretic Peptide: 153 pg/mL — ABNORMAL HIGH (ref 0.0–100.0)

## 2017-11-29 LAB — BLOOD GAS, ARTERIAL
ACID-BASE EXCESS: 6.3 mmol/L — AB (ref 0.0–2.0)
BICARBONATE: 30.6 mmol/L — AB (ref 20.0–28.0)
Delivery systems: POSITIVE
Expiratory PAP: 6
FIO2: 0.4
Inspiratory PAP: 12
LHR: 10 {breaths}/min
O2 Saturation: 93.5 %
PATIENT TEMPERATURE: 37
PH ART: 7.47 — AB (ref 7.350–7.450)
pCO2 arterial: 42 mmHg (ref 32.0–48.0)
pO2, Arterial: 64 mmHg — ABNORMAL LOW (ref 83.0–108.0)

## 2017-11-29 LAB — CBC WITH DIFFERENTIAL/PLATELET
Abs Immature Granulocytes: 0.03 10*3/uL (ref 0.00–0.07)
BASOS ABS: 0 10*3/uL (ref 0.0–0.1)
BASOS PCT: 0 %
EOS ABS: 0 10*3/uL (ref 0.0–0.5)
EOS PCT: 1 %
HEMATOCRIT: 31.3 % — AB (ref 36.0–46.0)
Hemoglobin: 9.7 g/dL — ABNORMAL LOW (ref 12.0–15.0)
IMMATURE GRANULOCYTES: 1 %
LYMPHS ABS: 1 10*3/uL (ref 0.7–4.0)
Lymphocytes Relative: 16 %
MCH: 27.2 pg (ref 26.0–34.0)
MCHC: 31 g/dL (ref 30.0–36.0)
MCV: 87.9 fL (ref 80.0–100.0)
Monocytes Absolute: 0.6 10*3/uL (ref 0.1–1.0)
Monocytes Relative: 10 %
NEUTROS PCT: 72 %
NRBC: 1 % — AB (ref 0.0–0.2)
Neutro Abs: 4.2 10*3/uL (ref 1.7–7.7)
PLATELETS: 186 10*3/uL (ref 150–400)
RBC: 3.56 MIL/uL — AB (ref 3.87–5.11)
RDW: 19 % — AB (ref 11.5–15.5)
WBC: 5.8 10*3/uL (ref 4.0–10.5)

## 2017-11-29 LAB — TROPONIN I: Troponin I: 0.04 ng/mL (ref <0.03)

## 2017-11-29 MED ORDER — ONDANSETRON HCL 4 MG/2ML IJ SOLN: 4.0000 mg | Freq: Four times a day (QID) | INTRAMUSCULAR | Status: DC | PRN

## 2017-11-29 MED ORDER — UMECLIDINIUM BROMIDE 62.5 MCG/INH IN AEPB
1.0000 | INHALATION_SPRAY | Freq: Every day | RESPIRATORY_TRACT | Status: DC
  Administered 2017-11-30 – 2017-12-01 (×2): 1 via RESPIRATORY_TRACT
  Filled 2017-11-29: qty 7

## 2017-11-29 MED ORDER — IPRATROPIUM BROMIDE 0.03 % NA SOLN
2.0000 | Freq: Two times a day (BID) | NASAL | Status: DC
  Administered 2017-11-29 – 2017-12-05 (×12): 2 via NASAL
  Filled 2017-11-29: qty 30

## 2017-11-29 MED ORDER — FLUTICASONE FUROATE-VILANTEROL 100-25 MCG/INH IN AEPB
1.0000 | INHALATION_SPRAY | Freq: Every day | RESPIRATORY_TRACT | Status: DC
  Administered 2017-11-30 – 2017-12-01 (×2): 1 via RESPIRATORY_TRACT
  Filled 2017-11-29: qty 28

## 2017-11-29 MED ORDER — DOCUSATE SODIUM 100 MG PO CAPS: 100.0000 mg | ORAL_CAPSULE | Freq: Every day | ORAL | Status: DC

## 2017-11-29 MED ORDER — FLUOXETINE HCL 10 MG PO CAPS
10.0000 mg | ORAL_CAPSULE | Freq: Every day | ORAL | Status: DC
  Administered 2017-11-30 – 2017-12-05 (×6): 10 mg via ORAL
  Filled 2017-11-29 (×7): qty 1

## 2017-11-29 MED ORDER — METHYLPREDNISOLONE SODIUM SUCC 125 MG IJ SOLR
60.0000 mg | Freq: Four times a day (QID) | INTRAMUSCULAR | Status: DC
  Administered 2017-11-29 – 2017-11-30 (×3): 60 mg via INTRAVENOUS
  Filled 2017-11-29 (×3): qty 2

## 2017-11-29 MED ORDER — ACETAMINOPHEN ER 650 MG PO TBCR: 650.0000 mg | EXTENDED_RELEASE_TABLET | Freq: Three times a day (TID) | ORAL | Status: DC | PRN

## 2017-11-29 MED ORDER — LINAGLIPTIN 5 MG PO TABS
5.0000 mg | ORAL_TABLET | Freq: Every day | ORAL | Status: DC
  Administered 2017-11-30 – 2017-12-05 (×6): 5 mg via ORAL
  Filled 2017-11-29 (×7): qty 1

## 2017-11-29 MED ORDER — PATIROMER SORBITEX CALCIUM 8.4 G PO PACK
16.8000 g | PACK | Freq: Every day | ORAL | Status: AC
  Filled 2017-11-29 (×2): qty 2

## 2017-11-29 MED ORDER — SODIUM POLYSTYRENE SULFONATE 15 GM/60ML PO SUSP
30.0000 g | Freq: Once | ORAL | Status: AC
  Administered 2017-11-29: 30 g via ORAL
  Filled 2017-11-29: qty 120

## 2017-11-29 MED ORDER — GUAIFENESIN ER 600 MG PO TB12
600.0000 mg | ORAL_TABLET | Freq: Two times a day (BID) | ORAL | Status: DC
  Administered 2017-11-29 – 2017-12-05 (×12): 600 mg via ORAL
  Filled 2017-11-29 (×12): qty 1

## 2017-11-29 MED ORDER — POLYETHYLENE GLYCOL 3350 17 G PO PACK: 17.0000 g | PACK | Freq: Every day | ORAL | Status: DC | PRN

## 2017-11-29 MED ORDER — SODIUM CHLORIDE 0.9 % IV SOLN
INTRAVENOUS | Status: DC
  Administered 2017-11-29: 22:00:00 via INTRAVENOUS

## 2017-11-29 MED ORDER — ACETAMINOPHEN 325 MG PO TABS
650.0000 mg | ORAL_TABLET | Freq: Four times a day (QID) | ORAL | Status: DC | PRN
  Administered 2017-11-30 – 2017-12-03 (×3): 650 mg via ORAL
  Filled 2017-11-29 (×4): qty 2

## 2017-11-29 MED ORDER — PANTOPRAZOLE SODIUM 40 MG PO TBEC
40.0000 mg | DELAYED_RELEASE_TABLET | Freq: Every day | ORAL | Status: DC
  Administered 2017-11-30 – 2017-12-05 (×6): 40 mg via ORAL
  Filled 2017-11-29 (×6): qty 1

## 2017-11-29 MED ORDER — IPRATROPIUM-ALBUTEROL 0.5-2.5 (3) MG/3ML IN SOLN
3.0000 mL | Freq: Four times a day (QID) | RESPIRATORY_TRACT | Status: DC
  Administered 2017-11-29 – 2017-12-04 (×19): 3 mL via RESPIRATORY_TRACT
  Filled 2017-11-29 (×17): qty 3

## 2017-11-29 MED ORDER — SODIUM CHLORIDE 0.9 % IV SOLN
500.0000 mg | Freq: Once | INTRAVENOUS | Status: AC
  Administered 2017-11-29: 500 mg via INTRAVENOUS
  Filled 2017-11-29: qty 500

## 2017-11-29 MED ORDER — LEVETIRACETAM ER 500 MG PO TB24
500.0000 mg | ORAL_TABLET | Freq: Every day | ORAL | Status: DC
  Administered 2017-11-30 – 2017-12-05 (×6): 500 mg via ORAL
  Filled 2017-11-29 (×7): qty 1

## 2017-11-29 MED ORDER — GABAPENTIN 100 MG PO CAPS
100.0000 mg | ORAL_CAPSULE | Freq: Every day | ORAL | Status: DC
  Administered 2017-11-29 – 2017-12-04 (×6): 100 mg via ORAL
  Filled 2017-11-29 (×6): qty 1

## 2017-11-29 MED ORDER — DOCUSATE SODIUM 100 MG PO CAPS
100.0000 mg | ORAL_CAPSULE | Freq: Two times a day (BID) | ORAL | Status: DC
  Administered 2017-11-29 – 2017-12-05 (×7): 100 mg via ORAL
  Filled 2017-11-29 (×9): qty 1

## 2017-11-29 MED ORDER — ATORVASTATIN CALCIUM 20 MG PO TABS
80.0000 mg | ORAL_TABLET | Freq: Every day | ORAL | Status: DC
  Administered 2017-11-30 – 2017-12-05 (×6): 80 mg via ORAL
  Filled 2017-11-29 (×7): qty 4

## 2017-11-29 MED ORDER — LORATADINE 10 MG PO TABS
10.0000 mg | ORAL_TABLET | Freq: Every day | ORAL | Status: DC
  Administered 2017-11-30 – 2017-12-05 (×6): 10 mg via ORAL
  Filled 2017-11-29 (×8): qty 1

## 2017-11-29 MED ORDER — DILTIAZEM HCL ER 60 MG PO CP12
120.0000 mg | ORAL_CAPSULE | Freq: Two times a day (BID) | ORAL | Status: DC
  Administered 2017-11-29 – 2017-12-05 (×11): 120 mg via ORAL
  Filled 2017-11-29 (×13): qty 2

## 2017-11-29 MED ORDER — ALBUTEROL SULFATE (2.5 MG/3ML) 0.083% IN NEBU
7.5000 mg | INHALATION_SOLUTION | Freq: Once | RESPIRATORY_TRACT | Status: AC
  Administered 2017-11-29: 7.5 mg via RESPIRATORY_TRACT
  Filled 2017-11-29: qty 9

## 2017-11-29 MED ORDER — FLUTICASONE-UMECLIDIN-VILANT 100-62.5-25 MCG/INH IN AEPB: 1.0000 | INHALATION_SPRAY | Freq: Every day | RESPIRATORY_TRACT | Status: DC

## 2017-11-29 MED ORDER — HEPARIN SODIUM (PORCINE) 5000 UNIT/ML IJ SOLN
5000.0000 [IU] | Freq: Three times a day (TID) | INTRAMUSCULAR | Status: DC
  Administered 2017-11-29 – 2017-12-05 (×16): 5000 [IU] via SUBCUTANEOUS
  Filled 2017-11-29 (×17): qty 1

## 2017-11-29 MED ORDER — HYDRALAZINE HCL 25 MG PO TABS
25.0000 mg | ORAL_TABLET | Freq: Three times a day (TID) | ORAL | Status: DC
  Administered 2017-11-29 – 2017-12-05 (×14): 25 mg via ORAL
  Filled 2017-11-29 (×16): qty 1

## 2017-11-29 MED ORDER — CLOPIDOGREL BISULFATE 75 MG PO TABS
75.0000 mg | ORAL_TABLET | Freq: Every day | ORAL | Status: DC
  Administered 2017-11-30 – 2017-12-05 (×6): 75 mg via ORAL
  Filled 2017-11-29 (×7): qty 1

## 2017-11-29 MED ORDER — BUDESONIDE 0.5 MG/2ML IN SUSP
0.5000 mg | Freq: Two times a day (BID) | RESPIRATORY_TRACT | Status: DC
  Administered 2017-11-29 – 2017-12-05 (×12): 0.5 mg via RESPIRATORY_TRACT
  Filled 2017-11-29 (×12): qty 2

## 2017-11-29 MED ORDER — FAMOTIDINE IN NACL 20-0.9 MG/50ML-% IV SOLN
20.0000 mg | INTRAVENOUS | Status: DC
  Administered 2017-11-29: 20 mg via INTRAVENOUS
  Filled 2017-11-29: qty 50

## 2017-11-29 MED ORDER — ONDANSETRON HCL 4 MG PO TABS: 4.0000 mg | ORAL_TABLET | Freq: Four times a day (QID) | ORAL | Status: DC | PRN

## 2017-11-29 NOTE — ED Notes (Signed)
Pt transported to room 222 

## 2017-11-29 NOTE — ED Triage Notes (Signed)
Per EMS family went to visit at [facility] and felt pt was short of breath. On EMS arrival sats 68% on pts regular 2L [place]. Pt placed on 4L to 88%. After 2 duonebs 97%. On arrival to ED bay they tried 15L [place] and sats remained 88%. Bipap placed immediately on arrival to ED. Pt is alert and can answer questions

## 2017-11-29 NOTE — Progress Notes (Signed)
Family Meeting Note  Advance Directive:yes  Today a meeting took place with the Patient.  Patient is able to participate   The following clinical team members were present during this meeting:MD  The following were discussed:Patient's diagnosis: copd, Patient's progosis: Unable to determine and Goals for treatment: Full Code  Additional follow-up to be provided: prn  Time spent during discussion:20 minutes  Tassie Pollett D Rynn Markiewicz, MD  

## 2017-11-29 NOTE — H&P (Addendum)
[facility] [place] NAME: Katherine [name]    MR#:  [id number]  DATE OF BIRTH:  [birth year]  DATE OF ADMISSION:  11/29/2017  PRIMARY CARE PHYSICIAN: Valerie Roys, DO   REQUESTING/REFERRING PHYSICIAN:   CHIEF COMPLAINT:   Chief Complaint  Patient presents with  . Respiratory Distress    HISTORY OF PRESENT ILLNESS: Katherine [name]  is a 77 y.o. female with a known history per below, frequent hospitalizations for similar presentation for COPD exacerbation, had acute shortness of breath earlier today, brought to the emergency room via EMS noted to have O2 saturation at 68% on 2 L-patient really was not wearing her oxygen at the time per nursing staff, patient noted to have tachypnea, but was placed on BiPAP in the emergency room, blood gas largely unimpressive with PO2 62 compensated, potassium 5.6, creatinine 2.4-near baseline, chest x-ray noted for atelectasis, patient evaluated at the bedside in the emergency room, no apparent distress, resting comfortably in bed, patient now be admitted for acute on chronic hypoxic respiratory failure due to probable mild COPD exacerbation, acute hyperkalemia.  PAST MEDICAL HISTORY:   Past Medical History:  Diagnosis Date  . Angioedema   . Anxiety   . Anxiety and depression   . CHF (congestive heart failure) ([place])   . Chronic constipation   . Chronic kidney disease    stage 3  . COPD (chronic obstructive pulmonary disease) ([place])   . Gallstones   . GERD (gastroesophageal reflux disease)   . Hypertension   . Left ventricular hypertrophy   . Osteoarthritis   . Prurigo nodularis   . Seizures ([place])   . Stroke ([place])   . Tobacco abuse   . Vitamin D deficiency disease     PAST SURGICAL HISTORY:  Past Surgical History:  Procedure Laterality Date  . CHOLECYSTECTOMY    . 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   Procedure: TOTAL KNEE ARTHROPLASTY;  Surgeon: Hessie Knows, MD;  Location: [facility] ORS;  Service: Orthopedics;  Laterality: Right;    SOCIAL HISTORY:  Social History   Tobacco Use  . Smoking status: Former Smoker    Packs/day: 0.50    Years: 60.00    Pack years: 30.00    Types: Cigarettes  . Smokeless tobacco: Never Used  Substance Use Topics  . Alcohol use: No    Alcohol/week: 0.0 standard drinks    Comment: rare    FAMILY HISTORY:  Family History  Problem Relation Age of Onset  . Alcohol abuse Mother   . Sickle cell anemia Daughter   . Hypertension Son   . Cancer Neg Hx   . COPD Neg Hx   . Diabetes Neg Hx   . Heart disease Neg Hx   . Stroke Neg Hx     DRUG ALLERGIES:  Allergies  Allergen Reactions  . Bee Venom Swelling  . Enalapril Maleate Swelling    REVIEW OF SYSTEMS:   CONSTITUTIONAL: No fever, fatigue or weakness.  EYES: No blurred or double vision.  EARS, NOSE, AND THROAT: No tinnitus or ear pain.  RESPIRATORY: No cough, +shortness of breath, wheezing CARDIOVASCULAR: No chest pain, orthopnea, edema.  GASTROINTESTINAL: No nausea, vomiting, diarrhea or abdominal pain.  GENITOURINARY: No dysuria, hematuria.  ENDOCRINE: No polyuria, nocturia,  HEMATOLOGY: No anemia, easy bruising or bleeding SKIN: No rash or lesion. MUSCULOSKELETAL: No joint pain or arthritis.   NEUROLOGIC: No tingling, numbness, weakness.  PSYCHIATRY:  No anxiety or depression.   MEDICATIONS AT HOME:  Prior to Admission medications   Medication Sig Start Date End Date Taking? Authorizing Provider  acetaminophen (TYLENOL) 650 MG CR tablet Take 650-1,300 mg by mouth every 8 (eight) hours as needed for pain.    [provider]  albuterol (ACCUNEB) 0.63 MG/3ML nebulizer solution Take 3 mLs by nebulization 3 (three) times daily as needed for wheezing.    [provider]  albuterol (PROVENTIL HFA;VENTOLIN HFA) 108 (90 Base) MCG/ACT inhaler Inhale 2 puffs into the lungs every 4 (four) hours as needed for wheezing or shortness of  breath.    [provider]  ALPRAZolam Duanne Moron) 0.5 MG tablet Take 0.5 mg by mouth 2 (two) times daily as needed for anxiety.    [provider]  atorvastatin (LIPITOR) 80 MG tablet Take 80 mg by mouth daily.    [provider]  cetirizine (ZYRTEC) 10 MG tablet Take 5 mg by mouth at bedtime.    [provider]  cholecalciferol (VITAMIN D) 1000 units tablet Take 1,000 Units by mouth daily.    [provider]  clopidogrel (PLAVIX) 75 MG tablet Take 75 mg by mouth daily.    [provider]  diltiazem (CARDIZEM SR) 120 MG 12 hr capsule Take 120 mg by mouth 2 (two) times daily.    [provider]  docusate sodium (COLACE) 100 MG capsule Take 100 mg by mouth daily.    [provider]  FLUoxetine (PROZAC) 10 MG capsule Take 10 mg by mouth daily.    [provider]  Fluticasone-Umeclidin-Vilant 100-62.5-25 MCG/INH AEPB Inhale 1 puff into the lungs daily.    [provider]  furosemide (LASIX) 20 MG tablet Take 1 tablet (20 mg total) by mouth daily. 11/14/17   Jay Kempe, Avel Peace, MD  gabapentin (NEURONTIN) 300 MG capsule Take 300 mg by mouth at bedtime.    [provider]  guaiFENesin (MUCINEX) 600 MG 12 hr tablet Take 1 tablet (600 mg total) by mouth 2 (two) times daily. 11/13/17   Fielding Mault, Avel Peace, MD  hydrALAZINE (APRESOLINE) 25 MG tablet Take 25 mg by mouth 3 (three) times daily.    [provider]  ipratropium (ATROVENT) 0.03 % nasal spray Place 2 sprays into both nostrils every 12 (twelve) hours.    [provider]  ipratropium-albuterol (DUONEB) 0.5-2.5 (3) MG/3ML SOLN Take 3 mLs by nebulization every 6 (six) hours as needed (shortness of breath).    [provider]  levETIRAcetam (KEPPRA XR) 500 MG 24 hr tablet Take 1 tablet (500 mg total) by mouth daily. 11/19/17   Venancio Poisson, NP  linagliptin (TRADJENTA) 5 MG TABS tablet Take 5 mg by mouth daily.    [provider]  meclizine (ANTIVERT) 25 MG tablet Take 25 mg by mouth 2 (two) times daily as needed for dizziness.    [provider]  methocarbamol (ROBAXIN) 500 MG tablet Take 500 mg by mouth every 8 (eight) hours as needed for muscle spasms.    [provider]  pantoprazole (PROTONIX) 40 MG tablet Take 40 mg by mouth daily.    [provider]  tiotropium (SPIRIVA) 18 MCG inhalation capsule Place 18 mcg into inhaler and inhale daily.    [provider]      PHYSICAL EXAMINATION:   VITAL SIGNS: Blood pressure 107/62, pulse 74, temperature 97.7 F (36.5 C), temperature source Axillary, resp. rate (!) 29, height 5\' 6"  (1.676 m), weight 113.4 kg, SpO2  96 %.  GENERAL:  77 y.o.-year-old patient lying in the bed with no acute distress.  Extreme morbid obesity EYES: Pupils equal, round, reactive to light and accommodation. No scleral icterus. Extraocular muscles intact.  HEENT: Head atraumatic, normocephalic. Oropharynx and nasopharynx clear.  NECK:  Supple, no jugular venous distention. No thyroid enlargement, no tenderness.  LUNGS: Diminished breath sounds with rhonchi bilaterally. No use of accessory muscles of respiration.  CARDIOVASCULAR: S1, S2 normal. No murmurs, rubs, or gallops.  ABDOMEN: Soft, nontender, nondistended. Bowel sounds present. No organomegaly or mass.  EXTREMITIES: No pedal edema, cyanosis, or clubbing.  NEUROLOGIC: Cranial nerves II through XII are intact. MAES. Gait not checked.  PSYCHIATRIC: The patient is alert and oriented x 3.  SKIN: No obvious rash, lesion, or ulcer.   LABORATORY PANEL:   CBC Recent Labs  Lab 11/29/17 1746  WBC 5.8  HGB 9.7*  HCT 31.3*  PLT 186  MCV 87.9  MCH 27.2  MCHC 31.0  RDW 19.0*  LYMPHSABS 1.0  MONOABS 0.6  EOSABS 0.0  BASOSABS 0.0   ------------------------------------------------------------------------------------------------------------------  Chemistries  Recent Labs  Lab 11/23/17 0511  11/29/17 1746  NA 140 134*  K 3.7 5.6*  CL 103 96*  CO2 31 29  GLUCOSE 151* 141*  BUN 30* 40*  CREATININE 1.70* 2.38*  CALCIUM 7.8* 8.8*   ------------------------------------------------------------------------------------------------------------------ estimated creatinine clearance is 25.7 mL/min (A) (by C-G formula based on SCr of 2.38 mg/dL (H)). ------------------------------------------------------------------------------------------------------------------ No results for input(s): TSH, T4TOTAL, T3FREE, THYROIDAB in the last 72 hours.  Invalid input(s): FREET3   Coagulation profile No results for input(s): INR, PROTIME in the last 168 hours. ------------------------------------------------------------------------------------------------------------------- No results for input(s): DDIMER in the last 72 hours. -------------------------------------------------------------------------------------------------------------------  Cardiac Enzymes Recent Labs  Lab 11/29/17 1746  TROPONINI 0.04*   ------------------------------------------------------------------------------------------------------------------ Invalid input(s): POCBNP  ---------------------------------------------------------------------------------------------------------------  Urinalysis    Component Value Date/Time   COLORURINE YELLOW (A) 11/22/2017 0050   APPEARANCEUR CLOUDY (A) 11/22/2017 0050   APPEARANCEUR Clear 02/20/2013 1459   LABSPEC 1.013 11/22/2017 0050   LABSPEC 1.012 02/20/2013 1459   PHURINE 5.0 11/22/2017 0050   GLUCOSEU NEGATIVE 11/22/2017 0050   GLUCOSEU Negative 02/20/2013 1459   HGBUR SMALL (A) 11/22/2017 0050   BILIRUBINUR NEGATIVE 11/22/2017 0050   BILIRUBINUR Negative 02/20/2013 1459   KETONESUR NEGATIVE 11/22/2017 0050   PROTEINUR NEGATIVE 11/22/2017 0050   NITRITE NEGATIVE 11/22/2017 0050   LEUKOCYTESUR LARGE (A) 11/22/2017 0050   LEUKOCYTESUR Negative 02/20/2013 1459      RADIOLOGY: Dg Chest 1 View  Result Date: 11/29/2017 CLINICAL DATA:  Shortness of breath. EXAM: CHEST  1 VIEW COMPARISON:  11/20/2017 FINDINGS: Cardiomediastinal silhouette is normal. Mediastinal contours appear intact. Calcific atherosclerotic disease of the aorta. Low lung volumes with bilateral lower lobe atelectatic changes. Mild bilateral pleural thickening versus pleural effusions. Osseous structures are without acute abnormality. Soft tissues are grossly normal. IMPRESSION: Low lung volumes with bilateral lower lobe atelectatic changes. Mild bilateral pleural thickening versus pleural effusions. Electronically Signed   By: Fidela Salisbury M.D.   On: 11/29/2017 17:45    EKG: Orders placed or performed during the hospital encounter of 11/29/17  . EKG 12-Lead  . EKG 12-Lead    IMPRESSION AND PLAN: *Acute on chronic hypoxic respiratory failure Secondary to mild COPD exacerbation, cannot rule out noncompliance/patient not wearing oxygen as instructed  noted frequent hospital admissions for same clinical presentation  Wean O2 as tolerated-on 2 L via nasal cannula chronically at home, blood gas in the emergency room noted  to be compensated with PO2 of 62  *Acute on COPD exacerbation Resolving  Continue IV Solu-Medrol with quick taper, aggressive pulmonary toilet bronchodilator therapy, inhaled corticosteroids twice daily, mucolytic agents, O2 with weaning per above  *Acute hyperkalemia Veltassa daily for 2-day course, Kayexalate p.o. x1, check BMP in the morning  *Chronic kidney disease stage III Near baseline Baseline creatinine around 1.7-2 Avoid nephrotoxic agents, gentle IV fluids for rehydration, BMP in the morning  *Chronic benign essential hypertension Stable on current regiment  *History of coronary artery disease Stable Continue plavix  *Chronic diabetes mellitus type 2  Last globin A1c 7.7 Sliding scale insulin with Accu-Cheks per routine  *Chronic  normocytic anemia  At baseline   *History of seizures Stable Continue Keppra  *Chronic anxiety/depression w/o suicidal ideation Stable  Continue home psychotropic regiment  All the records are reviewed and case discussed with ED provider. Management plans discussed with the patient, family and they are in agreement.  CODE STATUS:full Code Status History    Date Active Date Inactive Code Status Order ID Comments User Context   11/22/2017 0408 11/26/2017 1804 Full Code [id number]  Arta Silence, MD Inpatient   11/10/2017 1840 11/13/2017 1653 Full Code [id number]  Sela Hua, MD Inpatient   10/24/2017 0247 10/29/2017 2301 Full Code [id number]  Lance Coon, MD Inpatient   10/04/2017 1005 10/10/2017 1813 Full Code [id number]  Estill Bakes Inpatient   10/01/2017 0819 10/04/2017 0904 Full Code [id number]  Lolita Cram, RN Inpatient   09/19/2017 1306 09/22/2017 1622 Full Code [id number]  Gorden Harms, MD Inpatient   09/11/2017 2021 09/15/2017 1510 Full Code [id number]  Gorden Harms, MD Inpatient   08/27/2017 1543 08/29/2017 2159 Full Code [id number]  Hillary Bow, MD ED   06/28/2017 1414 06/30/2017 1748 Full Code [id number]  Allanna Bresee, Avel Peace, MD Inpatient   06/12/2017 0858 06/16/2017 1659 DNR [id number]  Demetrios Loll, MD Inpatient   05/26/2017 1732 05/30/2017 1446 DNR [id number]  Hillary Bow, MD ED   05/17/2017 1228 05/21/2017 1511 DNR [id number]  Asencion Gowda, NP Inpatient   04/21/2017 0117 04/22/2017 1750 DNR [id number]  Lance Coon, MD Inpatient   04/05/2017 1411 04/08/2017 2101 Full Code [id number]  Saundra Shelling, MD Inpatient   02/08/2016 1414 02/11/2016 1729 Full Code [id number]  Hessie Knows, MD Inpatient   11/27/2015 1645 11/29/2015 1628 Full Code [id number]  Idelle Crouch, MD Inpatient   02/10/2015 2122 02/12/2015 1809 Full Code [id number]  Lytle Butte, MD ED   09/16/2014 1220 09/19/2014 1736 Full Code [id number]  Aldean Jewett, MD Inpatient       TOTAL TIME TAKING CARE  OF THIS PATIENT: 45 minutes.    Avel Peace Eyad Rochford M.D on 11/29/2017   Between 7am to 6pm - Pager - [phone]  After 6pm go to [email or web address] - password [email or web address] [facility]  Office  [phone]  CC: Primary care physician; Valerie Roys, DO   Note: This dictation was prepared with Dragon dictation along with smaller phrase technology. Any transcriptional errors that result from this process are unintentional.

## 2017-11-29 NOTE — ED Notes (Signed)
Date and time results received: 11/29/17 6:31 PM  Test: Troponin Critical Value: 0.04 ng/mL  Name of Provider Notified: Dr. Clearnce Hasten

## 2017-11-29 NOTE — ED Notes (Signed)
Pt is going to the floor at this time.

## 2017-11-29 NOTE — ED Notes (Signed)
Katherine [name] [phone]

## 2017-11-29 NOTE — ED Provider Notes (Signed)
[facility] Emergency Department Provider Note  ___________________________________________   First MD Initiated Contact with Patient 11/29/17 1659     (approximate)  I have reviewed the triage vital signs and the nursing notes.   HISTORY  Chief Complaint Respiratory Distress   HPI Katherine [name] is a 77 y.o. female with history of CHF, COPD and anxiety was presented to the emergency department with respiratory distress.  [facility] with hypoxia anywhere between 70s and 80s on her baseline 2 to 4 L of nasal cannula oxygen.  EMS reported that they initially found the patient to be 85% on her baseline nasal cannula oxygen.  Placed on a nonrebreather mask and then transition to 2 DuoNeb's.  Also gave 125 mg of IM Solu-Medrol in route.  EMS reports that the patient initially was very confused but had improved responsiveness with nebulizer treatments.  Patient denying any pain at this time.  Past Medical History:  Diagnosis Date  . Angioedema   . Anxiety   . Anxiety and depression   . CHF (congestive heart failure) ([place])   . Chronic constipation   . Chronic kidney disease    stage 3  . COPD (chronic obstructive pulmonary disease) ([place])   . Gallstones   . GERD (gastroesophageal reflux disease)   . Hypertension   . Left ventricular hypertrophy   . Osteoarthritis   . Prurigo nodularis   . Seizures ([place])   . Stroke ([place])   . Tobacco abuse   . Vitamin D deficiency disease     Patient Active Problem List   Diagnosis Date Noted  . Urinary tract infection 11/22/2017  . Acute respiratory distress 11/22/2017  . 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 Diabetes mellitus type 2, uncomplicated ([place]) 02/21/3233  . Acute on chronic respiratory failure with hypoxia ([place]) 11/10/2017  . Pressure injury of skin 10/29/2017  . Sepsis ([place]) 10/24/2017  . Acute kidney injury superimposed on CKD ([place]) 10/24/2017  . Severe sepsis  ([place]) 10/24/2017  . Possible Seizures ([place]) 10/08/2017  . Hyperlipemia 10/08/2017  . 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 Stroke-like episode ([place]) s/p IV tpa 10/04/2017  . Palliative care by specialist   . Elevated rheumatoid factor 09/05/2017  . Frequent hospital admissions 08/22/2017  . Aphasia 06/28/2017  . Hand pain 03/21/2017  . Dry skin 03/21/2017  . Pain in finger of left hand 09/12/2016  . Chronic fatigue 06/12/2016  . Left knee pain 06/12/2016  . Goals of care, counseling/discussion 03/13/2016  . Primary localized osteoarthritis of right knee 02/08/2016  . OSA (obstructive sleep apnea) 09/16/2015  . Rotator cuff syndrome 09/07/2015  . 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 Sleep disturbance 04/14/2015  . Coronary artery disease 03/14/2015  . 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 Anxiety   . Tobacco abuse   . Prurigo nodularis   . GERD (gastroesophageal reflux disease)   . COPD (chronic obstructive pulmonary disease) ([place])   . Osteoarthritis   . Vitamin D deficiency disease   . Chronic constipation   . CKD (chronic kidney disease), stage III ([place])   . LVH (left ventricular hypertrophy) 05/20/2013  . Essential hypertension 05/20/2013  . Morbid obesity ([place]) 05/20/2013    Past Surgical History:  Procedure Laterality Date  . CHOLECYSTECTOMY    . 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   Procedure: TOTAL KNEE ARTHROPLASTY;  Surgeon: Legrand Como  Rudene Christians, MD;  Location: [facility] ORS;  Service: Orthopedics;  Laterality: Right;    Prior to Admission medications   Medication Sig Start Date End Date Taking? Authorizing Provider  acetaminophen (TYLENOL) 650 MG CR tablet Take 650-1,300 mg by mouth every 8 (eight) hours as needed for pain.    [provider]  albuterol (ACCUNEB) 0.63 MG/3ML nebulizer solution Take 3 mLs by nebulization 3 (three) times daily as needed for wheezing.     [provider]  albuterol (PROVENTIL HFA;VENTOLIN HFA) 108 (90 Base) MCG/ACT inhaler Inhale 2 puffs into the lungs every 4 (four) hours as needed for wheezing or shortness of breath.    [provider]  ALPRAZolam Duanne Moron) 0.5 MG tablet Take 0.5 mg by mouth 2 (two) times daily as needed for anxiety.    [provider]  atorvastatin (LIPITOR) 80 MG tablet Take 80 mg by mouth daily.    [provider]  cetirizine (ZYRTEC) 10 MG tablet Take 5 mg by mouth at bedtime.    [provider]  cholecalciferol (VITAMIN D) 1000 units tablet Take 1,000 Units by mouth daily.    [provider]  clopidogrel (PLAVIX) 75 MG tablet Take 75 mg by mouth daily.    [provider]  diltiazem (CARDIZEM SR) 120 MG 12 hr capsule Take 120 mg by mouth 2 (two) times daily.    [provider]  docusate sodium (COLACE) 100 MG capsule Take 100 mg by mouth daily.    [provider]  FLUoxetine (PROZAC) 10 MG capsule Take 10 mg by mouth daily.    [provider]  Fluticasone-Umeclidin-Vilant 100-62.5-25 MCG/INH AEPB Inhale 1 puff into the lungs daily.    [provider]  furosemide (LASIX) 20 MG tablet Take 1 tablet (20 mg total) by mouth daily. 11/14/17   Salary, Avel Peace, MD  gabapentin (NEURONTIN) 300 MG capsule Take 300 mg by mouth at bedtime.    [provider]  guaiFENesin (MUCINEX) 600 MG 12 hr tablet Take 1 tablet (600 mg total) by mouth 2 (two) times daily. 11/13/17   Salary, Avel Peace, MD  hydrALAZINE (APRESOLINE) 25 MG tablet Take 25 mg by mouth 3 (three) times daily.    [provider]  ipratropium (ATROVENT) 0.03 % nasal spray Place 2 sprays into both nostrils every 12 (twelve) hours.    [provider]  ipratropium-albuterol (DUONEB) 0.5-2.5 (3) MG/3ML SOLN Take 3 mLs by nebulization every 6 (six) hours as needed (shortness of breath).    [provider]  levETIRAcetam (KEPPRA XR) 500 MG  24 hr tablet Take 1 tablet (500 mg total) by mouth daily. 11/19/17   Venancio Poisson, NP  linagliptin (TRADJENTA) 5 MG TABS tablet Take 5 mg by mouth daily.    [provider]  meclizine (ANTIVERT) 25 MG tablet Take 25 mg by mouth 2 (two) times daily as needed for dizziness.    [provider]  methocarbamol (ROBAXIN) 500 MG tablet Take 500 mg by mouth every 8 (eight) hours as needed for muscle spasms.    [provider]  pantoprazole (PROTONIX) 40 MG tablet Take 40 mg by mouth daily.    [provider]  tiotropium (SPIRIVA) 18 MCG inhalation capsule Place 18 mcg into inhaler and inhale daily.    [provider]    Allergies Bee venom and Enalapril maleate  Family History  Problem Relation Age of Onset  . Alcohol abuse Mother   . Sickle cell anemia Daughter   .  Hypertension Son   . Cancer Neg Hx   . COPD Neg Hx   . Diabetes Neg Hx   . Heart disease Neg Hx   . Stroke Neg Hx     Social History Social History   Tobacco Use  . Smoking status: Former Smoker    Packs/day: 0.50    Years: 60.00    Pack years: 30.00    Types: Cigarettes  . Smokeless tobacco: Never Used  Substance Use Topics  . Alcohol use: No    Alcohol/week: 0.0 standard drinks    Comment: rare  . Drug use: No    Review of Systems  Constitutional: No fever/chills Eyes: No visual changes. ENT: No sore throat. Cardiovascular: Denies chest pain. Respiratory: As above Gastrointestinal: No abdominal pain.  No nausea, no vomiting.  No diarrhea.  No constipation. Genitourinary: Negative for dysuria. Musculoskeletal: Negative for back pain. Skin: Negative for rash. Neurological: Negative for headaches, focal weakness or numbness.   ____________________________________________   PHYSICAL EXAM:  VITAL SIGNS: ED Triage Vitals [11/29/17 1711]  Enc Vitals Group     BP 112/79     Pulse Rate 73     Resp (!) 30     Temp      Temp src      SpO2 95 %     Weight       Height      Head Circumference      [facility] Flow      Pain Score      Pain Loc      Pain Edu?      Excl. in [place]?     Constitutional: Alert and oriented to self.  Labored respirations. Eyes: Conjunctivae are normal.  Head: Atraumatic. Nose: No congestion/rhinnorhea. Mouth/Throat: Mucous membranes are moist.  Neck: No stridor.   Cardiovascular: Normal rate, regular rhythm. Grossly normal heart sounds.  Respiratory: Labored respirations with severely decreased air movement throughout all fields.  Coarse wheezing throughout all fields. Gastrointestinal: Soft and nontender. No distention. No CVA tenderness. Musculoskeletal: No lower extremity tenderness nor edema.  No joint effusions. Neurologic:  Normal speech and language. No gross focal neurologic deficits are appreciated. Skin:  Skin is warm, dry and intact. No rash noted. Psychiatric: Mood and affect are normal. Speech and behavior are normal.  ____________________________________________   LABS (all labs ordered are listed, but only abnormal results are displayed)  Labs Reviewed  CBC WITH DIFFERENTIAL/PLATELET  BASIC METABOLIC PANEL  TROPONIN I  BRAIN NATRIURETIC PEPTIDE  BLOOD GAS, ARTERIAL   ____________________________________________  EKG  ED ECG REPORT I, Doran Stabler, the attending physician, personally viewed and interpreted this ECG.   Date: 11/29/2017  EKG Time: 1716  Rate: 73  Rhythm: normal sinus rhythm  Axis: Normal  Intervals:none  ST&T Change: No ST segment elevation or depression.  No abnormal T wave inversion.  EKG machine reads minimal elevation in inferior leads but this is likely secondary to baseline disturbance.  ____________________________________________  RADIOLOGY   ____________________________________________   PROCEDURES  Procedure(s) performed:   .Critical Care Performed by: Orbie Pyo, MD Authorized by: Orbie Pyo, MD   Critical care  provider statement:    Critical care time (minutes):  35   Critical care time was exclusive of:  Separately billable procedures and treating other patients   Critical care was necessary to treat or prevent imminent or life-threatening deterioration of the following conditions:  Respiratory failure   Critical care was time spent personally by me  on the following activities:  Development of treatment plan with patient or surrogate, discussions with consultants, evaluation of patient's response to treatment, examination of patient, obtaining history from patient or surrogate, ordering and performing treatments and interventions, ordering and review of laboratory studies, ordering and review of radiographic studies, pulse oximetry, re-evaluation of patient's condition and review of old charts    Critical Care performed:   ____________________________________________   INITIAL IMPRESSION / [place] / ED COURSE  Pertinent labs & imaging results that were available during my care of the patient were reviewed by me and considered in my medical decision making (see chart for details).  Differential includes, but is not limited to, viral syndrome, bronchitis including COPD exacerbation, pneumonia, reactive airway disease including asthma, CHF including exacerbation with or without pulmonary/interstitial edema, pneumothorax, ACS, thoracic trauma, and pulmonary embolism. As part of my medical decision making, I reviewed the following data within the electronic MEDICAL RECORD NUMBER Notes from prior ED visits  ----------------------------------------- 6:41 PM on 11/29/2017 -----------------------------------------  Patient tolerating BiPAP well.  To be admitted for COPD exacerbation.  Also with acute renal failure.  Family as well as patient aware of plan as well as diagnosis and willing to comply. ____________________________________________   FINAL CLINICAL IMPRESSION(S) / ED  DIAGNOSES  COPD exacerbation.  Acute renal failure.  NEW MEDICATIONS STARTED DURING THIS VISIT:  New Prescriptions   No medications on file     Note:  This document was prepared using Dragon voice recognition software and may include unintentional dictation errors.     Orbie Pyo, MD 11/29/17 [phone]

## 2017-11-29 NOTE — ED Notes (Signed)
Bipap off per Dr Jerelyn Charles. O2 4L in place

## 2017-11-30 ENCOUNTER — Other Ambulatory Visit: Payer: Self-pay

## 2017-11-30 ENCOUNTER — Ambulatory Visit: Payer: Medicare HMO

## 2017-11-30 DIAGNOSIS — F419 Anxiety disorder, unspecified: Secondary | ICD-10-CM | POA: Diagnosis present

## 2017-11-30 DIAGNOSIS — J441 Chronic obstructive pulmonary disease with (acute) exacerbation: Secondary | ICD-10-CM | POA: Diagnosis present

## 2017-11-30 DIAGNOSIS — Z66 Do not resuscitate: Secondary | ICD-10-CM | POA: Diagnosis present

## 2017-11-30 DIAGNOSIS — E785 Hyperlipidemia, unspecified: Secondary | ICD-10-CM | POA: Diagnosis present

## 2017-11-30 DIAGNOSIS — I2510 Atherosclerotic heart disease of native coronary artery without angina pectoris: Secondary | ICD-10-CM | POA: Diagnosis present

## 2017-11-30 DIAGNOSIS — E1122 Type 2 diabetes mellitus with diabetic chronic kidney disease: Secondary | ICD-10-CM | POA: Diagnosis present

## 2017-11-30 DIAGNOSIS — Z7902 Long term (current) use of antithrombotics/antiplatelets: Secondary | ICD-10-CM | POA: Diagnosis not present

## 2017-11-30 DIAGNOSIS — E559 Vitamin D deficiency, unspecified: Secondary | ICD-10-CM | POA: Diagnosis present

## 2017-11-30 DIAGNOSIS — I509 Heart failure, unspecified: Secondary | ICD-10-CM | POA: Diagnosis present

## 2017-11-30 DIAGNOSIS — F329 Major depressive disorder, single episode, unspecified: Secondary | ICD-10-CM | POA: Diagnosis present

## 2017-11-30 DIAGNOSIS — N179 Acute kidney failure, unspecified: Secondary | ICD-10-CM | POA: Diagnosis present

## 2017-11-30 DIAGNOSIS — K219 Gastro-esophageal reflux disease without esophagitis: Secondary | ICD-10-CM | POA: Diagnosis present

## 2017-11-30 DIAGNOSIS — R569 Unspecified convulsions: Secondary | ICD-10-CM | POA: Diagnosis present

## 2017-11-30 DIAGNOSIS — I130 Hypertensive heart and chronic kidney disease with heart failure and stage 1 through stage 4 chronic kidney disease, or unspecified chronic kidney disease: Secondary | ICD-10-CM | POA: Diagnosis present

## 2017-11-30 DIAGNOSIS — Z6841 Body Mass Index (BMI) 40.0 and over, adult: Secondary | ICD-10-CM | POA: Diagnosis not present

## 2017-11-30 DIAGNOSIS — E875 Hyperkalemia: Secondary | ICD-10-CM | POA: Diagnosis present

## 2017-11-30 DIAGNOSIS — Z96651 Presence of right artificial knee joint: Secondary | ICD-10-CM | POA: Diagnosis present

## 2017-11-30 DIAGNOSIS — N183 Chronic kidney disease, stage 3 (moderate): Secondary | ICD-10-CM | POA: Diagnosis present

## 2017-11-30 DIAGNOSIS — E1165 Type 2 diabetes mellitus with hyperglycemia: Secondary | ICD-10-CM | POA: Diagnosis present

## 2017-11-30 DIAGNOSIS — Z8673 Personal history of transient ischemic attack (TIA), and cerebral infarction without residual deficits: Secondary | ICD-10-CM | POA: Diagnosis not present

## 2017-11-30 DIAGNOSIS — K5909 Other constipation: Secondary | ICD-10-CM | POA: Diagnosis present

## 2017-11-30 DIAGNOSIS — J9621 Acute and chronic respiratory failure with hypoxia: Secondary | ICD-10-CM | POA: Diagnosis present

## 2017-11-30 DIAGNOSIS — J9811 Atelectasis: Secondary | ICD-10-CM | POA: Diagnosis present

## 2017-11-30 HISTORY — DX: Chronic obstructive pulmonary disease with (acute) exacerbation: J44.1

## 2017-11-30 LAB — BASIC METABOLIC PANEL
ANION GAP: 11 (ref 5–15)
BUN: 39 mg/dL — ABNORMAL HIGH (ref 8–23)
CALCIUM: 8.7 mg/dL — AB (ref 8.9–10.3)
CO2: 28 mmol/L (ref 22–32)
CREATININE: 2.19 mg/dL — AB (ref 0.44–1.00)
Chloride: 99 mmol/L (ref 98–111)
GFR calc non Af Amer: 21 mL/min — ABNORMAL LOW (ref >60)
GFR, EST AFRICAN AMERICAN: 24 mL/min — AB (ref >60)
Glucose, Bld: 319 mg/dL — ABNORMAL HIGH (ref 70–99)
Potassium: 5 mmol/L (ref 3.5–5.1)
SODIUM: 138 mmol/L (ref 135–145)

## 2017-11-30 LAB — TROPONIN I
TROPONIN I: 0.03 ng/mL — AB (ref <0.03)
Troponin I: 0.03 ng/mL (ref <0.03)

## 2017-11-30 LAB — GLUCOSE, CAPILLARY
GLUCOSE-CAPILLARY: 257 mg/dL — AB (ref 70–99)
GLUCOSE-CAPILLARY: 245 mg/dL — AB (ref 70–99)

## 2017-11-30 MED ORDER — FAMOTIDINE 20 MG PO TABS
20.0000 mg | ORAL_TABLET | Freq: Every day | ORAL | Status: DC
  Administered 2017-11-30 – 2017-12-05 (×6): 20 mg via ORAL
  Filled 2017-11-30 (×6): qty 1

## 2017-11-30 MED ORDER — METHYLPREDNISOLONE SODIUM SUCC 125 MG IJ SOLR
60.0000 mg | Freq: Every day | INTRAMUSCULAR | Status: DC
  Administered 2017-12-01 – 2017-12-03 (×3): 60 mg via INTRAVENOUS
  Filled 2017-11-30 (×3): qty 2

## 2017-11-30 MED ORDER — INSULIN ASPART 100 UNIT/ML ~~LOC~~ SOLN
0.0000 [IU] | Freq: Three times a day (TID) | SUBCUTANEOUS | Status: DC
  Administered 2017-11-30: 8 [IU] via SUBCUTANEOUS
  Administered 2017-12-01: 11 [IU] via SUBCUTANEOUS
  Administered 2017-12-01: 3 [IU] via SUBCUTANEOUS
  Administered 2017-12-01: 7 [IU] via SUBCUTANEOUS
  Administered 2017-12-02 (×3): 5 [IU] via SUBCUTANEOUS
  Administered 2017-12-03: 8 [IU] via SUBCUTANEOUS
  Administered 2017-12-03: 5 [IU] via SUBCUTANEOUS
  Administered 2017-12-03: 3 [IU] via SUBCUTANEOUS
  Administered 2017-12-04 (×2): 5 [IU] via SUBCUTANEOUS
  Administered 2017-12-04 – 2017-12-05 (×2): 3 [IU] via SUBCUTANEOUS
  Administered 2017-12-05: 2 [IU] via SUBCUTANEOUS
  Filled 2017-11-30 (×15): qty 1

## 2017-11-30 MED ORDER — ALUM & MAG HYDROXIDE-SIMETH 200-200-20 MG/5ML PO SUSP
30.0000 mL | Freq: Four times a day (QID) | ORAL | Status: DC | PRN
  Administered 2017-11-30: 30 mL via ORAL
  Filled 2017-11-30: qty 30

## 2017-11-30 MED ORDER — INSULIN ASPART 100 UNIT/ML ~~LOC~~ SOLN
0.0000 [IU] | Freq: Every day | SUBCUTANEOUS | Status: DC
  Administered 2017-12-01: 2 [IU] via SUBCUTANEOUS
  Administered 2017-12-01: 3 [IU] via SUBCUTANEOUS
  Administered 2017-12-02: 4 [IU] via SUBCUTANEOUS
  Administered 2017-12-03 – 2017-12-04 (×2): 2 [IU] via SUBCUTANEOUS
  Filled 2017-11-30 (×5): qty 1

## 2017-11-30 NOTE — Patient Outreach (Signed)
[facility]) Care Management  11/30/2017  Abby Tucholski 11-28-1940 [id number]  Patient continues to be hospitalized/reside at SNF 10 days after admission to [facility] on 11/21/17 for Acute Kidney Injury and mental status change. Patient was discharged to [facility] on 10/14 however was transported back to [facility] 11/29/17 and admitted for COPD exacerbation.  Plan: RN CM will close discipline and notify CSW, [place] Rollene Rotunda RN, BSN [facility] Care Management Coordinator [phone]

## 2017-11-30 NOTE — Care Management Note (Signed)
Case Management Note  Patient Details  Name: Katherine [name] MRN: [id number] Date of Birth: 1940-03-17    Patient admitted from [facility], however prior to admission patient was limited on participation with PT and per husband "Katherine [name] is [facility] because she can't progress with [facility]".  Patient and husband did not place a bed hold at the facility prior to being admitted to the hospital. Assessment completed with husband Katherine [name] via telephone.   Patient has had 15 presentations to the hospital in 6 months.  PCP [place].  Per spouse "we don't have a car, I use my bike to go to the store and pick up a few things".  Son and his friend provide transportation to doctors appointments.  Patient has RW, shower seat, and chronic O2 with [employer] at home.  Husband expresses concerns about being able to care for wife at home. Husband states that he has called today to start the process for long term Medicaid. RNCM informed spouse multiple times that  [employer] would not be active at the time of this discharge.  RNCM unsure if spouse comprehended this information. Spouse would like PT to assessed patient this admission to determine if patient could return to rehab.  Spouse notified that in order for patient to discharge to SNF the following would have to occur  - PT assessed patient and determine that patient is  appropriate for SNF level of care  - and [employer] approve for SNF level of care at discharge  Spouse notified that he MUST have a back up plan for patient to return home. Patient is previously open with [facility] services and COPD protocol.  [place] with [facility] notified of admission and states that she is still following the case.  At spouses request RNCM contacted Son Katherine [name].  Son was updated on above information. Son states that if the patient does return home there are multiple family members who maybe able to provide support.  Son to discuss disposition with  family.     RNCM requested PT eval from MD  Subjective/Objective:                    Action/Plan:   Expected Discharge Date:                  Expected Discharge Plan:     In-House Referral:     Discharge planning Services     Post Acute Care Choice:    Choice offered to:     DME Arranged:    DME Agency:     HH Arranged:    HH Agency:     Status of Service:     If discussed at [employer], dates discussed:    Additional Comments:  Beverly Sessions, RN 11/30/2017, 3:45 PM

## 2017-11-30 NOTE — Clinical Social Work Note (Signed)
Clinical Social Work Assessment  Patient Details  Name: Katherine [name] MRN: [id number] Date of Birth: 14-Oct-1940  Date of referral:  11/30/17               Reason for consult:  Facility Placement                Permission sought to share information with:  Facility Art therapist granted to share information::  Yes, Verbal Permission Granted  Name::        Agency::     Relationship::     Contact Information:     Housing/Transportation Living arrangements for the past 2 months:  Single Family Home, [place] of Information:  Other (Comment Required)(colleague) Patient Interpreter Needed:  None Criminal Activity/Legal Involvement Pertinent to Current Situation/Hospitalization:  No - Comment as needed Significant Relationships:  Adult Children, Spouse Lives with:  Facility Resident, Spouse Do you feel safe going back to the place where you live?    Need for family participation in patient care:  Yes (Comment)  Care giving concerns:  [facility].   Social Worker assessment / plan:  CSW Geophysical data processor) spoke with patient's husband this morning due to him calling [place]. Patient's husband was wanting to pursue long term placement and was told he needed to apply for medicaid which he has not done. Please refer to earlier note from [place], [place].  RN CM called patient's husband and explained that PT would need to assess, insurance would have to approve and then she could go for short term rehab again but that he will be in the same situation and have to take patient home from rehab because he can't pay long term. Please refer to RN CM documentation on this admission.  CSW spoke with Otila Kluver at [facility] and she stated that the husband did not do a bed hold and they do not have a long term bed so they may not be able to take her back.   Employment status:    Insurance information:    PT Recommendations:    Information / Referral  to community resources:     Patient/Family's Response to care:  Patient's husband may not have understood the information provided by Hermenia Bers or the RN CM. RN CM contacted patient's son to ensure information was understtood.  Patient/Family's Understanding of and Emotional Response to Diagnosis, Current Treatment, and Prognosis:  Patient's husband is very focused on wanting patient to go long term somewhere but has not gone to initiate a medicaid application.   Emotional Assessment Appearance:    Attitude/Demeanor/Rapport:    Affect (typically observed):    Orientation:    Alcohol / Substance use:  Not Applicable Psych involvement (Current and /or in the community):  No (Comment)  Discharge Needs  Concerns to be addressed:  No discharge needs identified Readmission within the last 30 days:  Yes Current discharge risk:  None Barriers to Discharge:  No Barriers Identified   Shela Leff, LCSW 11/30/2017, 3:56 PM

## 2017-11-30 NOTE — Progress Notes (Signed)
Clinical Social Worker (CSW) received a call from patient's husband Bennie Hind [phone] stating that he can't take care of his wife and she needs to live in a facility long term. Per husband Mcarthur Rossetti is cutting [facility] off at [facility] because she can't progress with PT. CSW explained that [facility] will not pay for long term care and patient needs to get on long term care medicaid or it will be private pay. Per husband patient does not have medicaid and he can't private pay for SNF. CSW explained to husband how to apply for long term care medicaid at [place]. CSW explained that if patient has no payer for SNF then she will have to D/C home. Husband verbalized his understanding.   [employer], LCSW [phone]

## 2017-11-30 NOTE — Progress Notes (Signed)
Inpatient Diabetes Program Recommendations  AACE/ADA: New Consensus Statement on Inpatient Glycemic Control (2019)  Target Ranges:  Prepandial:   less than 140 mg/dL      [facility] postprandial:   less than 180 mg/dL (1-2 hours)      Critically ill patients:  140 - 180 mg/dL   Results for Katherine [name], Katherine [name] (MRN [id number]) as of 11/30/2017 09:52  Ref. Range 11/29/2017 17:46 11/30/2017 06:04  Glucose Latest Ref Range: 70 - 99 mg/dL 141 (H) 319 (H)   Review of Glycemic Control  Diabetes history: DM2 Outpatient Diabetes medications: Tradjenta 5 mg daily Current orders for Inpatient glycemic control: Tradjenta 5 mg daily; Solumedrol 60 mg Q6H  Inpatient Diabetes Program Recommendations: Correction (SSI): Please consider ordering CBGs with Novolog 0-9 units TID with meals and Novolog 0-5 units QHS.  Thanks, Barnie Alderman, RN, MSN, CDE Diabetes Coordinator Inpatient Diabetes Program [phone] (Team Pager from 8am to 5pm)

## 2017-11-30 NOTE — Progress Notes (Signed)
[place] [facility] [place] NAME: Katherine [name]    MR#:  [id number]  DATE OF BIRTH:  06/05/1940  SUBJECTIVE:  CHIEF COMPLAINT:   Chief Complaint  Patient presents with  . Respiratory Distress  Patient is noncompliant to oxygen and treatments.  Came with worsening shortness of breath and initially required BiPAP in ER, but was able to come off on nasal cannula. Today her oxygen saturation is stable but she had complain of epigastric pain.  REVIEW OF SYSTEMS:  CONSTITUTIONAL: No fever, fatigue or weakness.  EYES: No blurred or double vision.  EARS, NOSE, AND THROAT: No tinnitus or ear pain.  RESPIRATORY: No cough, have shortness of breath, wheezing or hemoptysis.  CARDIOVASCULAR: Have chest pain, no orthopnea, edema.  GASTROINTESTINAL: No nausea, vomiting, diarrhea or abdominal pain.  GENITOURINARY: No dysuria, hematuria.  ENDOCRINE: No polyuria, nocturia,  HEMATOLOGY: No anemia, easy bruising or bleeding SKIN: No rash or lesion. MUSCULOSKELETAL: No joint pain or arthritis.   NEUROLOGIC: No tingling, numbness, weakness.  PSYCHIATRY: No anxiety or depression.   ROS  DRUG ALLERGIES:   Allergies  Allergen Reactions  . Bee Venom Swelling  . Enalapril Maleate Swelling    VITALS:  Blood pressure 140/77, pulse 86, temperature 98.2 F (36.8 C), temperature source Oral, resp. rate 20, height 5\' 6"  (1.676 m), weight 121.8 kg, SpO2 93 %.  PHYSICAL EXAMINATION:  GENERAL:  77 y.o.-year-old patient lying in the bed with no acute distress.  EYES: Pupils equal, round, reactive to light and accommodation. No scleral icterus. Extraocular muscles intact.  HEENT: Head atraumatic, normocephalic. Oropharynx and nasopharynx clear.  NECK:  Supple, no jugular venous distention. No thyroid enlargement, no tenderness.  LUNGS: Normal breath sounds bilaterally, some wheezing, no crepitation. No use of accessory muscles of respiration.  Supplemental oxygen via  nasal cannula in use. CARDIOVASCULAR: S1, S2 normal. No murmurs, rubs, or gallops.  ABDOMEN: Soft, nontender, nondistended. Bowel sounds present. No organomegaly or mass.  EXTREMITIES: No pedal edema, cyanosis, or clubbing.  NEUROLOGIC: Cranial nerves II through XII are intact. Muscle strength 4/5 in all extremities. Sensation intact. Gait not checked.  PSYCHIATRIC: The patient is alert and oriented x 3.  SKIN: No obvious rash, lesion, or ulcer.   Physical Exam LABORATORY PANEL:   CBC Recent Labs  Lab 11/29/17 1746  WBC 5.8  HGB 9.7*  HCT 31.3*  PLT 186   ------------------------------------------------------------------------------------------------------------------  Chemistries  Recent Labs  Lab 11/30/17 0604  NA 138  K 5.0  CL 99  CO2 28  GLUCOSE 319*  BUN 39*  CREATININE 2.19*  CALCIUM 8.7*   ------------------------------------------------------------------------------------------------------------------  Cardiac Enzymes Recent Labs  Lab 11/30/17 1535 11/30/17 1903  TROPONINI 0.03* 0.03*   ------------------------------------------------------------------------------------------------------------------  RADIOLOGY:  Dg Chest 1 View  Result Date: 11/29/2017 CLINICAL DATA:  Shortness of breath. EXAM: CHEST  1 VIEW COMPARISON:  11/20/2017 FINDINGS: Cardiomediastinal silhouette is normal. Mediastinal contours appear intact. Calcific atherosclerotic disease of the aorta. Low lung volumes with bilateral lower lobe atelectatic changes. Mild bilateral pleural thickening versus pleural effusions. Osseous structures are without acute abnormality. Soft tissues are grossly normal. IMPRESSION: Low lung volumes with bilateral lower lobe atelectatic changes. Mild bilateral pleural thickening versus pleural effusions. Electronically Signed   By: Fidela Salisbury M.D.   On: 11/29/2017 17:45    ASSESSMENT AND PLAN:   Active Problems:   COPD (chronic obstructive pulmonary  disease) (HCC)   COPD exacerbation (HCC)  *Acute on chronic hypoxic respiratory failure Secondary  to mild COPD exacerbation, cannot rule out noncompliance/patient not wearing oxygen as instructed  noted frequent hospital admissions for same clinical presentation  Wean O2 as tolerated-on 2 L via nasal cannula chronically at home, taper steroids.  *Acute on COPD exacerbation Resolving  Continue IV Solu-Medrol with quick taper, aggressive pulmonary toilet bronchodilator therapy, inhaled corticosteroids twice daily, mucolytic agents, O2 with weaning per above  *Acute hyperkalemia Veltassa daily for 2-day course, Kayexalate p.o. x1, check BMP in the morning, improving.  *Chronic kidney disease stage III Near baseline Baseline creatinine around 1.7-2 Avoid nephrotoxic agents, gentle IV fluids for rehydration, BMP in the morning  *Chronic benign essential hypertension Stable on current regiment  * Chest pain May be epigastric or gastric origin. Check troponin. Patient already received Protonix.  Give some Maalox.  *History of coronary artery disease Stable Continue plavix  *Chronic diabetes mellitus type 2  Last globin A1c 7.7 Sliding scale insulin with Accu-Cheks per routine  *Chronic normocytic anemia  At baseline   *History of seizures Stable Continue Keppra  *Chronic anxiety/depression w/o suicidal ideation Stable  Continue home psychotropic regiment   All the records are reviewed and case discussed with Care Management/Social Workerr. Management plans discussed with the patient, family and they are in agreement.  CODE STATUS: Full.  TOTAL TIME TAKING CARE OF THIS PATIENT: 35 minutes.    POSSIBLE D/C IN 1-2 DAYS, DEPENDING ON CLINICAL CONDITION.   Vaughan Basta M.D on 11/30/2017   Between 7am to 6pm - Pager - [phone]  After 6pm go to [email or web address] - password [email or web address] [facility]  Office   [phone]  CC: Primary care physician; Valerie Roys, DO  Note: This dictation was prepared with Dragon dictation along with smaller phrase technology. Any transcriptional errors that result from this process are unintentional.

## 2017-11-30 NOTE — NC FL2 (Signed)
[place] LEVEL OF CARE SCREENING TOOL     IDENTIFICATION  Patient Name: Katherine [name] Birthdate: 06/06/1940 Sex: female Admission Date (Current Location): 11/29/2017  [facility] and [place] Number:  Engineering geologist and Address:  [facility], [address], [place], [place] [address]      Provider Number: [id number]  Attending Physician Name and Address:  Vaughan Basta, *  Relative Name and Phone Number:       Current Level of Care: Hospital Recommended Level of Care: [place] Prior Approval Number:    Date Approved/Denied:   PASRR Number:    Discharge Plan: SNF    Current Diagnoses: Patient Active Problem List   Diagnosis Date Noted  . COPD exacerbation ([place]) 11/30/2017  . Urinary tract infection 11/22/2017  . Acute respiratory distress 11/22/2017  . 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[place]) 11/16/2017  . Diabetes mellitus type 2, uncomplicated ([place]) 24/40/1027  . Acute on chronic respiratory failure with hypoxia ([place]) 11/10/2017  . Pressure injury of skin 10/29/2017  . Sepsis ([place]) 10/24/2017  . Acute kidney injury superimposed on CKD ([place]) 10/24/2017  . Severe sepsis ([place]) 10/24/2017  . Possible Seizures ([place]) 10/08/2017  . Hyperlipemia 10/08/2017  . 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 Stroke-like episode ([place]) s/p IV tpa 10/04/2017  . Palliative care by specialist   . Elevated rheumatoid factor 09/05/2017  . Frequent hospital admissions 08/22/2017  . Aphasia 06/28/2017  . Hand pain 03/21/2017  . Dry skin 03/21/2017  . Pain in finger of left hand 09/12/2016  . Chronic fatigue 06/12/2016  . Left knee pain 06/12/2016  . Goals of care, counseling/discussion 03/13/2016  . Primary localized osteoarthritis of right knee 02/08/2016  . OSA (obstructive sleep apnea) 09/16/2015  . Rotator cuff syndrome 09/07/2015  . 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 Sleep disturbance  04/14/2015  . Coronary artery disease 03/14/2015  . 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 Anxiety   . Tobacco abuse   . Prurigo nodularis   . GERD (gastroesophageal reflux disease)   . COPD (chronic obstructive pulmonary disease) ([place])   . Osteoarthritis   . Vitamin D deficiency disease   . Chronic constipation   . CKD (chronic kidney disease), stage III ([place])   . LVH (left ventricular hypertrophy) 05/20/2013  . Essential hypertension 05/20/2013  . Morbid obesity ([place]) 05/20/2013    Orientation RESPIRATION BLADDER Height & Weight     Self, Place  Normal, O2(4 liters) Continent Weight: 268 lb 8.3 oz (121.8 kg) Height:  5\' 6"  (167.6 cm)  BEHAVIORAL SYMPTOMS/MOOD NEUROLOGICAL BOWEL NUTRITION STATUS  (none) (none) Incontinent Diet  AMBULATORY STATUS COMMUNICATION OF NEEDS Skin   Extensive Assist Verbally PU Stage and Appropriate Care                       Personal Care Assistance Level of Assistance  Bathing, Feeding, Dressing Bathing Assistance: Maximum assistance Feeding assistance: Maximum assistance Dressing Assistance: Maximum assistance     Functional Limitations Info             SPECIAL CARE FACTORS FREQUENCY  PT (By licensed PT)                    Contractures Contractures Info: Not present    Additional Factors Info    Code Status Info: full             Current Medications (11/30/2017):  This is the current hospital active medication list Current Facility-Administered Medications  Medication Dose Route Frequency Provider Last Rate Last Dose  . acetaminophen (TYLENOL) tablet 650 mg  650 mg Oral Q6H PRN Salary, Holly Bodily D, MD   650 mg at 11/30/17 1339  . alum & mag hydroxide-simeth (MAALOX/MYLANTA) 200-200-20 MG/5ML suspension 30 mL  30 mL Oral Q6H PRN Vaughan Basta, MD      . atorvastatin (LIPITOR) tablet 80 mg  80 mg Oral Daily Salary, Montell D, MD   80 mg at 11/30/17 0906  . budesonide  (PULMICORT) nebulizer solution 0.5 mg  0.5 mg Nebulization BID Salary, Montell D, MD   0.5 mg at 11/30/17 0855  . clopidogrel (PLAVIX) tablet 75 mg  75 mg Oral Daily Salary, Montell D, MD   75 mg at 11/30/17 0906  . diltiazem (CARDIZEM SR) 12 hr capsule 120 mg  120 mg Oral BID Loney Hering D, MD   120 mg at 11/30/17 0905  . docusate sodium (COLACE) capsule 100 mg  100 mg Oral BID Loney Hering D, MD   100 mg at 11/30/17 0906  . famotidine (PEPCID) tablet 20 mg  20 mg Oral Daily Vaughan Basta, MD   20 mg at 11/30/17 1343  . FLUoxetine (PROZAC) capsule 10 mg  10 mg Oral Daily Salary, Montell D, MD   10 mg at 11/30/17 0905  . fluticasone furoate-vilanterol (BREO ELLIPTA) 100-25 MCG/INH 1 puff  1 puff Inhalation Daily Salary, Montell D, MD   1 puff at 11/30/17 0902   And  . umeclidinium bromide (INCRUSE ELLIPTA) 62.5 MCG/INH 1 puff  1 puff Inhalation Daily Salary, Montell D, MD   1 puff at 11/30/17 0903  . gabapentin (NEURONTIN) capsule 100 mg  100 mg Oral QHS Salary, Montell D, MD   100 mg at 11/29/17 2159  . guaiFENesin (MUCINEX) 12 hr tablet 600 mg  600 mg Oral BID Salary, Montell D, MD   600 mg at 11/30/17 0905  . heparin injection 5,000 Units  5,000 Units Subcutaneous Q8H Salary, Montell D, MD   5,000 Units at 11/30/17 1344  . hydrALAZINE (APRESOLINE) tablet 25 mg  25 mg Oral TID Loney Hering D, MD   25 mg at 11/30/17 0905  . insulin aspart (novoLOG) injection 0-15 Units  0-15 Units Subcutaneous TID WC Vaughan Basta, MD      . insulin aspart (novoLOG) injection 0-5 Units  0-5 Units Subcutaneous QHS Vaughan Basta, MD      . ipratropium (ATROVENT) 0.03 % nasal spray 2 spray  2 spray Each Nare Q12H Salary, Montell D, MD   2 spray at 11/30/17 0904  . ipratropium-albuterol (DUONEB) 0.5-2.5 (3) MG/3ML nebulizer solution 3 mL  3 mL Nebulization QID Salary, Montell D, MD   3 mL at 11/30/17 1119  . levETIRAcetam (KEPPRA XR) 24 hr tablet 500 mg  500 mg Oral Daily Salary,  Montell D, MD   500 mg at 11/30/17 0905  . linagliptin (TRADJENTA) tablet 5 mg  5 mg Oral Daily Salary, Montell D, MD   5 mg at 11/30/17 0905  . loratadine (CLARITIN) tablet 10 mg  10 mg Oral Daily Salary, Montell D, MD   10 mg at 11/30/17 0906  . [START ON 12/01/2017] methylPREDNISolone sodium succinate (SOLU-MEDROL) 125 mg/2 mL injection 60 mg  60 mg Intravenous Daily Vaughan Basta, MD      . ondansetron (ZOFRAN) tablet 4 mg  4 mg Oral Q6H PRN Salary, Avel Peace, MD  Or  . ondansetron (ZOFRAN) injection 4 mg  4 mg Intravenous Q6H PRN Salary, Montell D, MD      . pantoprazole (PROTONIX) EC tablet 40 mg  40 mg Oral Daily Salary, Montell D, MD   40 mg at 11/30/17 0905  . patiromer Daryll Drown) packet 16.8 g  16.8 g Oral Daily Salary, Montell D, MD      . polyethylene glycol (MIRALAX / GLYCOLAX) packet 17 g  17 g Oral Daily PRN Salary, Avel Peace, MD         Discharge Medications: Please see discharge summary for a list of discharge medications.  Relevant Imaging Results:  Relevant Lab Results:   Additional Information ss: [id number]  Shela Leff, LCSW

## 2017-11-30 NOTE — Progress Notes (Signed)
PHARMACIST - PHYSICIAN COMMUNICATION  DR:  Anselm Jungling  CONCERNING: IV to Oral Route Change Policy  RECOMMENDATION: This patient is receiving famotidine by the intravenous route.  Based on criteria approved by the Pharmacy and Therapeutics Committee, the intravenous medication(s) is/are being converted to the equivalent oral dose form(s).   DESCRIPTION: These criteria include:  The patient is eating (either orally or via tube) and/or has been taking other orally administered medications for a least 24 hours  The patient has no evidence of active gastrointestinal bleeding or impaired GI absorption (gastrectomy, short bowel, patient on TNA or NPO).  If you have questions about this conversion, please contact the Pharmacy Department  []   [phone] )  [facility] [x]   [phone] )  [facility] []   [phone] )  [facility] []   [phone] )  [facility] []   [phone] )  [place], PharmD 11/30/2017 9:15 AM

## 2017-12-01 ENCOUNTER — Inpatient Hospital Stay: Payer: Medicare HMO

## 2017-12-01 LAB — BASIC METABOLIC PANEL
Anion gap: 11 (ref 5–15)
BUN: 35 mg/dL — ABNORMAL HIGH (ref 8–23)
CO2: 31 mmol/L (ref 22–32)
Calcium: 8.7 mg/dL — ABNORMAL LOW (ref 8.9–10.3)
Chloride: 99 mmol/L (ref 98–111)
Creatinine, Ser: 1.85 mg/dL — ABNORMAL HIGH (ref 0.44–1.00)
GFR calc non Af Amer: 25 mL/min — ABNORMAL LOW (ref >60)
GFR, EST AFRICAN AMERICAN: 29 mL/min — AB (ref >60)
GLUCOSE: 205 mg/dL — AB (ref 70–99)
Potassium: 4.4 mmol/L (ref 3.5–5.1)
Sodium: 141 mmol/L (ref 135–145)

## 2017-12-01 LAB — CBC
HCT: 27.3 % — ABNORMAL LOW (ref 36.0–46.0)
Hemoglobin: 8.4 g/dL — ABNORMAL LOW (ref 12.0–15.0)
MCH: 26.9 pg (ref 26.0–34.0)
MCHC: 30.8 g/dL (ref 30.0–36.0)
MCV: 87.5 fL (ref 80.0–100.0)
NRBC: 0.5 % — AB (ref 0.0–0.2)
Platelets: 194 10*3/uL (ref 150–400)
RBC: 3.12 MIL/uL — AB (ref 3.87–5.11)
RDW: 18.9 % — ABNORMAL HIGH (ref 11.5–15.5)
WBC: 8.3 10*3/uL (ref 4.0–10.5)

## 2017-12-01 LAB — GLUCOSE, CAPILLARY
GLUCOSE-CAPILLARY: 180 mg/dL — AB (ref 70–99)
GLUCOSE-CAPILLARY: 225 mg/dL — AB (ref 70–99)
GLUCOSE-CAPILLARY: 334 mg/dL — AB (ref 70–99)
Glucose-Capillary: 251 mg/dL — ABNORMAL HIGH (ref 70–99)
Glucose-Capillary: 239 mg/dL — ABNORMAL HIGH (ref 70–99)

## 2017-12-01 LAB — TROPONIN I: Troponin I: 0.03 ng/mL (ref <0.03)

## 2017-12-01 MED ORDER — LOPERAMIDE HCL 2 MG PO CAPS
2.0000 mg | ORAL_CAPSULE | Freq: Four times a day (QID) | ORAL | Status: DC | PRN
  Administered 2017-12-01: 2 mg via ORAL
  Filled 2017-12-01 (×2): qty 1

## 2017-12-01 NOTE — Evaluation (Signed)
Physical Therapy Evaluation Patient Details Name: Katherine [name] MRN: [id number] DOB: 09-26-1940 Today's Date: 12/01/2017   History of Present Illness  Patient admitted with COPD exacerbation.  She has been a frequent re-admit and has recently come from SNF.  PMH includes, but is not limited to, COPD, stroke, seizures, OA, CHF and anxiety.  Clinical Impression  Patient is a 77 year old female admitted from SNF with above stated dx.  She has recently been required to receive assistance with bathing and dressing and states that she has not been able to get OOB without assistance for the past week.  Pt required mod A to get to EOB and presented with poor sitting posture.  Pt presented with overall poor strength and bilateral swelling of ankles.  Pt stood from EOB with min A and heavy use of UE's and RW.  She was able to complete a standing pivot transfer with min A and appearing very labored when stepping and turning TW.  Pt desat to 84% during transfer and required 2-3 min to recover.  Pt will benefit from skilled PT with focus on strength, tolerance to activity and balance and fall prevention.  SNF is appropriate discharge plan for pt due to stated deficits and need for assistance.    Follow Up Recommendations SNF    Equipment Recommendations  None recommended by PT(has all necessary DME at home)    Recommendations for Other Services       Precautions / Restrictions Precautions Precautions: Fall Restrictions Weight Bearing Restrictions: No      Mobility  Bed Mobility Overal bed mobility: Needs Assistance Bed Mobility: Supine to Sit     Supine to sit: Mod assist     General bed mobility comments: Hand held assist and two attempts to sit upright; able to get to EOB after assisance in sitting up.    Transfers Overall transfer level: Needs assistance Equipment used: Rolling walker (2 wheeled) Transfers: Sit to/from [facility] Sit to Stand: Min assist Stand pivot  transfers: Min assist       General transfer comment: 2 attempts to stand with heavy use of UE's and RW. Pt able to stand with use of RW CGA.  O2 desat to 84% on 3.5L following transfer with 2-3 min to recover to 91%.  Pt demonstrated increased effort when turning with RW and low foot clearance and step length.  Ambulation/Gait Ambulation/Gait assistance: (Not safe to ambulate due to O2 desat.)              Stairs            Wheelchair Mobility    Modified Rankin (Stroke Patients Only)       Balance Overall balance assessment: Needs assistance Sitting-balance support: Bilateral upper extremity supported;Feet supported Sitting balance-Leahy Scale: Poor     Standing balance support: Bilateral upper extremity supported Standing balance-Leahy Scale: Fair Standing balance comment: reliant on walker, general unsteadiness with all tasks                             Pertinent Vitals/Pain Pain Assessment: No/denies pain    Home Living Family/patient expects to be discharged to:: Skilled nursing facility Living Arrangements: Spouse/significant other Available Help at Discharge: Family;Available PRN/intermittently           Home Equipment: Walker - 2 wheels      Prior Function Level of Independence: Needs assistance   Gait / Transfers Assistance Needed: Assistance with  mobility.  Pt has been in a SNF since last discharge from hospital.  ADL's / Homemaking Assistance Needed: Pt currently receiving assistance with bathing and dressing.        Hand Dominance        Extremity/Trunk Assessment   Upper Extremity Assessment Upper Extremity Assessment: Generalized weakness    Lower Extremity Assessment Lower Extremity Assessment: Generalized weakness(Grossly 4-/5; swelling noted in bilateral ankles.)    Cervical / Trunk Assessment Cervical / Trunk Assessment: Kyphotic  Communication   Communication: HOH(Pt does not answer all questions  appropriately and does not apprear to hear PT at times.)  Cognition Arousal/Alertness: Awake/alert Behavior During Therapy: Restless Overall Cognitive Status: Within Functional Limits for tasks assessed                                 General Comments: Pt follows simple commands consistently when she is able to understand.  Requires PT to repeat often.      General Comments      Exercises     Assessment/Plan    PT Assessment Patient needs continued PT services  PT Problem List Decreased strength;Decreased activity tolerance;Decreased balance;Decreased mobility;Cardiopulmonary status limiting activity;Decreased safety awareness;Decreased knowledge of precautions;Decreased knowledge of use of DME;Obesity       PT Treatment Interventions DME instruction;Gait training;Functional mobility training;Therapeutic activities;Balance training;Patient/family education;Therapeutic exercise    PT Goals (Current goals can be found in the Care Plan section)  Acute Rehab PT Goals PT Goal Formulation: Patient unable to participate in goal setting    Frequency Min 2X/week   Barriers to discharge        Co-evaluation               AM-PAC PT "6 Clicks" Daily Activity  Outcome Measure Difficulty turning over in bed (including adjusting bedclothes, sheets and blankets)?: A Lot Difficulty moving from lying on back to sitting on the side of the bed? : A Lot Difficulty sitting down on and standing up from a chair with arms (e.g., wheelchair, bedside commode, etc,.)?: A Lot Help needed moving to and from a bed to chair (including a wheelchair)?: A Lot Help needed walking in hospital room?: Total Help needed climbing 3-5 steps with a railing? : Total 6 Click Score: 10    End of Session Equipment Utilized During Treatment: Oxygen Activity Tolerance: Patient limited by fatigue;Treatment limited secondary to medical complications (Comment)(O2 desat) Patient left: with call  bell/phone within reach;with chair alarm set Nurse Communication: Mobility status PT Visit Diagnosis: Unsteadiness on feet (R26.81);Difficulty in walking, not elsewhere classified (R26.2);Muscle weakness (generalized) (M62.81)    Time: 1000-1021 PT Time Calculation (min) (ACUTE ONLY): 21 min   Charges:   PT Evaluation $PT Eval Low Complexity: 1 Low          Roxanne Gates, PT, DPT   Roxanne Gates 12/01/2017, 10:44 AM

## 2017-12-01 NOTE — Progress Notes (Signed)
[place] at [place] NAME: Katherine [name]    MR#:  [id number]  DATE OF BIRTH:  [birth year]  SUBJECTIVE:   Patient here due to shortness of breath secondary to COPD exacerbation.  Complained that she did not sleep well last night.  No other respiratory complaints presently.  Awaiting insurance authorization for short-term rehab placement.  REVIEW OF SYSTEMS:    Review of Systems  Constitutional: Negative for chills and fever.  HENT: Negative for congestion and tinnitus.   Eyes: Negative for blurred vision and double vision.  Respiratory: Negative for cough, shortness of breath and wheezing.   Cardiovascular: Negative for chest pain, orthopnea and PND.  Gastrointestinal: Negative for abdominal pain, diarrhea, nausea and vomiting.  Genitourinary: Negative for dysuria and hematuria.  Neurological: Negative for dizziness, sensory change and focal weakness.  All other systems reviewed and are negative.   Nutrition: Heart Healthy Tolerating Diet: Yes Tolerating PT: Eval noted.   DRUG ALLERGIES:   Allergies  Allergen Reactions  . Bee Venom Swelling  . Enalapril Maleate Swelling    VITALS:  Blood pressure 118/78, pulse 95, temperature 98 F (36.7 C), temperature source Oral, resp. rate 20, height 5\' 6"  (1.676 m), weight 121.8 kg, SpO2 94 %.  PHYSICAL EXAMINATION:   Physical Exam  GENERAL:  77 y.o.-year-old obese patient lying in bed in no acute distress.  EYES: Pupils equal, round, reactive to light and accommodation. No scleral icterus. Extraocular muscles intact.  HEENT: Head atraumatic, normocephalic. Oropharynx and nasopharynx clear.  NECK:  Supple, no jugular venous distention. No thyroid enlargement, no tenderness.  LUNGS: Good A/E b/l, no wheezing, rales, rhonchi. No use of accessory muscles of respiration.  CARDIOVASCULAR: S1, S2 normal. No murmurs, rubs, or gallops.  ABDOMEN: Soft, nontender, nondistended. Bowel sounds  present. No organomegaly or mass.  EXTREMITIES: No cyanosis, clubbing or edema b/l.    NEUROLOGIC: Cranial nerves II through XII are intact. No focal Motor or sensory deficits b/l. Globally weak   PSYCHIATRIC: The patient is alert and oriented x 3.  SKIN: No obvious rash, lesion, or ulcer.    LABORATORY PANEL:   CBC Recent Labs  Lab 12/01/17 0618  WBC 8.3  HGB 8.4*  HCT 27.3*  PLT 194   ------------------------------------------------------------------------------------------------------------------  Chemistries  Recent Labs  Lab 12/01/17 0618  NA 141  K 4.4  CL 99  CO2 31  GLUCOSE 205*  BUN 35*  CREATININE 1.85*  CALCIUM 8.7*   ------------------------------------------------------------------------------------------------------------------  Cardiac Enzymes Recent Labs  Lab 11/30/17 2321  TROPONINI 0.03*   ------------------------------------------------------------------------------------------------------------------  RADIOLOGY:  Dg Chest 1 View  Result Date: 11/29/2017 CLINICAL DATA:  Shortness of breath. EXAM: CHEST  1 VIEW COMPARISON:  11/20/2017 FINDINGS: Cardiomediastinal silhouette is normal. Mediastinal contours appear intact. Calcific atherosclerotic disease of the aorta. Low lung volumes with bilateral lower lobe atelectatic changes. Mild bilateral pleural thickening versus pleural effusions. Osseous structures are without acute abnormality. Soft tissues are grossly normal. IMPRESSION: Low lung volumes with bilateral lower lobe atelectatic changes. Mild bilateral pleural thickening versus pleural effusions. Electronically Signed   By: Fidela Salisbury M.D.   On: 11/29/2017 17:45   Dg Chest 2 View  Result Date: 12/01/2017 CLINICAL DATA:  77 year old with COPD exacerbation. Shortness of breath. EXAM: CHEST - 2 VIEW COMPARISON:  11/29/2017 and 10/26/2017 FINDINGS: Coarse lung markings appear chronic. Linear and bandlike densities in left lower chest are  similar to the recent comparison exam and most compatible with  atelectasis. The upper lungs are clear. Slightly improved aeration at the right lung base. Heart size is within normal limits and stable. Atherosclerotic calcifications at the aortic arch. Negative for a pneumothorax. No acute bone abnormality. No significant pleural fluid. IMPRESSION: 1. Persistent densities in left lower chest. Findings are most compatible with atelectasis. 2. Slightly improved aeration at the right lung base. 3. Chronic lung changes. Electronically Signed   By: Markus Daft M.D.   On: 12/01/2017 09:03     ASSESSMENT AND PLAN:   77 year old female with past medical history of COPD, obesity, previous history of CVA, anxiety/depression, GERD, hypertension, history of seizures who presents to the hospital due to shortness of breath.  1.  Acute on chronic respiratory failure with hypoxia-secondary to COPD exacerbation. - Improving, continue IV steroids, scheduled duo nebs, Pulmicort nebs. - Less wheezing and bronchospasm.  2.  COPD exacerbation-this is a cause of patient's worsening respiratory failure with hypoxemia. - Continue IV steroids, scheduled duo nebs, Pulmicort nebs.  Chest x-ray is negative for any acute pneumonia.  3.  History of seizures-no acute seizure type activity.  Continue Keppra.  4.  GERD-continue Protonix.  5.  Hyperglycemia-secondary to IV steroids.  Continue sliding scale insulin for now.  Steroids are being tapered.  6.  Essential hypertension-continue Cardizem.  7.  History of previous CVA-continue Plavix.  8.  History of depression-continue Prozac.  9. Hyperlipidemia - cont. Atorvastatin     All the records are reviewed and case discussed with Care Management/Social Worker. Management plans discussed with the patient, family and they are in agreement.  CODE STATUS: Full code  DVT Prophylaxis: Hep SQ  TOTAL TIME TAKING CARE OF THIS PATIENT: 30 minutes.   POSSIBLE D/C IN 1-2  DAYS, DEPENDING ON CLINICAL CONDITION.   Henreitta Leber M.D on 12/01/2017 at 3:23 PM  Between 7am to 6pm - Pager - [phone]  After 6pm go to [email or web address] - Technical [employer] [facility]  Office  [phone]  CC: Primary care physician; Valerie Roys, DO

## 2017-12-02 LAB — GLUCOSE, CAPILLARY
GLUCOSE-CAPILLARY: 201 mg/dL — AB (ref 70–99)
Glucose-Capillary: 209 mg/dL — ABNORMAL HIGH (ref 70–99)
Glucose-Capillary: 238 mg/dL — ABNORMAL HIGH (ref 70–99)
Glucose-Capillary: 322 mg/dL — ABNORMAL HIGH (ref 70–99)

## 2017-12-02 NOTE — Progress Notes (Signed)
[employer] at [place] NAME: Katherine [name]    MR#:  [id number]  DATE OF BIRTH:  06/28/1940  SUBJECTIVE:   No acute events overnight, denies any worsening shortness of breath.  Awaiting insurance authorization for short-term rehab placement.  REVIEW OF SYSTEMS:    Review of Systems  Constitutional: Negative for chills and fever.  HENT: Negative for congestion and tinnitus.   Eyes: Negative for blurred vision and double vision.  Respiratory: Negative for cough, shortness of breath and wheezing.   Cardiovascular: Negative for chest pain, orthopnea and PND.  Gastrointestinal: Negative for abdominal pain, diarrhea, nausea and vomiting.  Genitourinary: Negative for dysuria and hematuria.  Neurological: Negative for dizziness, sensory change and focal weakness.  All other systems reviewed and are negative.   Nutrition: Heart Healthy Tolerating Diet: Yes Tolerating PT: Eval noted.   DRUG ALLERGIES:   Allergies  Allergen Reactions  . Bee Venom Swelling  . Enalapril Maleate Swelling    VITALS:  Blood pressure (!) 106/51, pulse 72, temperature 98.7 F (37.1 C), temperature source Oral, resp. rate 20, height 5\' 6"  (1.676 m), weight 121.8 kg, SpO2 96 %.  PHYSICAL EXAMINATION:   Physical Exam  GENERAL:  77 y.o.-year-old obese patient lying in bed in no acute distress.  EYES: Pupils equal, round, reactive to light and accommodation. No scleral icterus. Extraocular muscles intact.  HEENT: Head atraumatic, normocephalic. Oropharynx and nasopharynx clear.  NECK:  Supple, no jugular venous distention. No thyroid enlargement, no tenderness.  LUNGS: Good A/E b/l, no wheezing, rales, rhonchi. No use of accessory muscles of respiration.  CARDIOVASCULAR: S1, S2 normal. No murmurs, rubs, or gallops.  ABDOMEN: Soft, nontender, nondistended. Bowel sounds present. No organomegaly or mass.  EXTREMITIES: No cyanosis, clubbing or edema b/l.      NEUROLOGIC: Cranial nerves II through XII are intact. No focal Motor or sensory deficits b/l. Globally weak   PSYCHIATRIC: The patient is alert and oriented x 3.  SKIN: No obvious rash, lesion, or ulcer.    LABORATORY PANEL:   CBC Recent Labs  Lab 12/01/17 0618  WBC 8.3  HGB 8.4*  HCT 27.3*  PLT 194   ------------------------------------------------------------------------------------------------------------------  Chemistries  Recent Labs  Lab 12/01/17 0618  NA 141  K 4.4  CL 99  CO2 31  GLUCOSE 205*  BUN 35*  CREATININE 1.85*  CALCIUM 8.7*   ------------------------------------------------------------------------------------------------------------------  Cardiac Enzymes Recent Labs  Lab 11/30/17 2321  TROPONINI 0.03*   ------------------------------------------------------------------------------------------------------------------  RADIOLOGY:  Dg Chest 2 View  Result Date: 12/01/2017 CLINICAL DATA:  77 year old with COPD exacerbation. Shortness of breath. EXAM: CHEST - 2 VIEW COMPARISON:  11/29/2017 and 10/26/2017 FINDINGS: Coarse lung markings appear chronic. Linear and bandlike densities in left lower chest are similar to the recent comparison exam and most compatible with atelectasis. The upper lungs are clear. Slightly improved aeration at the right lung base. Heart size is within normal limits and stable. Atherosclerotic calcifications at the aortic arch. Negative for a pneumothorax. No acute bone abnormality. No significant pleural fluid. IMPRESSION: 1. Persistent densities in left lower chest. Findings are most compatible with atelectasis. 2. Slightly improved aeration at the right lung base. 3. Chronic lung changes. Electronically Signed   By: Markus Daft M.D.   On: 12/01/2017 09:03     ASSESSMENT AND PLAN:   77 year old female with past medical history of COPD, obesity, previous history of CVA, anxiety/depression, GERD, hypertension, history of  seizures who presents to  the hospital due to shortness of breath.  1.  Acute on chronic respiratory failure with hypoxia-secondary to COPD exacerbation. - Improving, continue IV steroids, scheduled duo nebs, Pulmicort nebs. Improving and feels better.  2.  COPD exacerbation-this is the cause of patient's worsening respiratory failure with hypoxemia. - Continue IV steroids, scheduled duo nebs, Pulmicort nebs.  Chest x-ray is negative for any acute pneumonia.  3.  History of seizures-no acute seizure type activity.  Continue Keppra.  4.  GERD-continue Protonix.  5.  Hyperglycemia-secondary to IV steroids.  Continue sliding scale insulin for now and BS improved.  - cont. To taper steroids.   6.  Essential hypertension-continue Cardizem.  7.  History of previous CVA-continue Plavix.  8.  History of depression-continue Prozac.  9. Hyperlipidemia - cont. Atorvastatin  Awaiting Insurance authorization for placement to SNF/STR  All the records are reviewed and case discussed with Care Management/Social Worker. Management plans discussed with the patient, family and they are in agreement.  CODE STATUS: Full code  DVT Prophylaxis: Hep SQ  TOTAL TIME TAKING CARE OF THIS PATIENT: 25 minutes.   POSSIBLE D/C IN 1-2 DAYS, DEPENDING ON CLINICAL CONDITION.   Henreitta Leber M.D on 12/02/2017 at 1:50 PM  Between 7am to 6pm - Pager - [phone]  After 6pm go to [email or web address] - Technical [employer] [facility]  Office  [phone]  CC: Primary care physician; Valerie Roys, DO

## 2017-12-03 LAB — GLUCOSE, CAPILLARY
GLUCOSE-CAPILLARY: 254 mg/dL — AB (ref 70–99)
GLUCOSE-CAPILLARY: 230 mg/dL — AB (ref 70–99)
GLUCOSE-CAPILLARY: 227 mg/dL — AB (ref 70–99)
Glucose-Capillary: 189 mg/dL — ABNORMAL HIGH (ref 70–99)
Glucose-Capillary: 200 mg/dL — ABNORMAL HIGH (ref 70–99)

## 2017-12-03 MED ORDER — METHYLPREDNISOLONE SODIUM SUCC 40 MG IJ SOLR
40.0000 mg | Freq: Every day | INTRAMUSCULAR | Status: DC
  Administered 2017-12-04: 40 mg via INTRAVENOUS
  Filled 2017-12-03: qty 1

## 2017-12-03 MED ORDER — INSULIN ASPART 100 UNIT/ML ~~LOC~~ SOLN
4.0000 [IU] | Freq: Three times a day (TID) | SUBCUTANEOUS | Status: DC
  Administered 2017-12-03 – 2017-12-05 (×6): 4 [IU] via SUBCUTANEOUS
  Filled 2017-12-03 (×6): qty 1

## 2017-12-03 NOTE — Progress Notes (Signed)
Inpatient Diabetes Program Recommendations  AACE/ADA: New Consensus Statement on Inpatient Glycemic Control (2019)  Target Ranges:  Prepandial:   less than 140 mg/dL      Peak postprandial:   less than 180 mg/dL (1-2 hours)      Critically ill patients:  140 - 180 mg/dL   Results for Katherine [name], Katherine [name] (MRN [id number]) as of 12/03/2017 09:57  Ref. Range 12/02/2017 07:36 12/02/2017 11:34 12/02/2017 16:50 12/02/2017 21:44 12/03/2017 08:10 12/03/2017 09:44  Glucose-Capillary Latest Ref Range: 70 - 99 mg/dL 201 (H) 209 (H) 238 (H) 322 (H) 189 (H) 200 (H)   Review of Glycemic Control  Diabetes history: DM2 Outpatient Diabetes medications: Tradjenat 5 mg daily Current orders for Inpatient glycemic control: Novolog 0-15 units TID with meals, Novolog 0-5 units QHS, Tradjenta 5 mg daily; Solumedrol 60 mg daily  Inpatient Diabetes Program Recommendations:  Insulin - Meal Coverage: If steroids are continued, please consider ordering Novolog 4 units TID with meals for meal coverage if patient eats at least 50% of meals.  Thanks, Barnie Alderman, RN, MSN, CDE Diabetes Coordinator Inpatient Diabetes Program [phone] (Team Pager from 8am to 5pm)

## 2017-12-03 NOTE — Care Management Important Message (Signed)
Copy of signed IM left with patient in room.  

## 2017-12-03 NOTE — Progress Notes (Signed)
[place] at [place] NAME: Katherine [name]    MR#:  [id number]  DATE OF BIRTH:  [birth year]  SUBJECTIVE:   Patient sitting up in chair in no acute distress.  No other acute events overnight.  Awaiting insurance authorization for rehab placement.  REVIEW OF SYSTEMS:    Review of Systems  Constitutional: Negative for chills and fever.  HENT: Negative for congestion and tinnitus.   Eyes: Negative for blurred vision and double vision.  Respiratory: Negative for cough, shortness of breath and wheezing.   Cardiovascular: Negative for chest pain, orthopnea and PND.  Gastrointestinal: Negative for abdominal pain, diarrhea, nausea and vomiting.  Genitourinary: Negative for dysuria and hematuria.  Neurological: Negative for dizziness, sensory change and focal weakness.  All other systems reviewed and are negative.   Nutrition: Heart Healthy Tolerating Diet: Yes Tolerating PT: Eval noted.   DRUG ALLERGIES:   Allergies  Allergen Reactions  . Bee Venom Swelling  . Enalapril Maleate Swelling    VITALS:  Blood pressure 114/70, pulse 73, temperature 97.9 F (36.6 C), temperature source Oral, resp. rate 20, height 5\' 6"  (1.676 m), weight 123.5 kg, SpO2 96 %.  PHYSICAL EXAMINATION:   Physical Exam  GENERAL:  77 y.o.-year-old obese patient sitting up in chair in no acute distress.  EYES: Pupils equal, round, reactive to light and accommodation. No scleral icterus. Extraocular muscles intact.  HEENT: Head atraumatic, normocephalic. Oropharynx and nasopharynx clear.  NECK:  Supple, no jugular venous distention. No thyroid enlargement, no tenderness.  LUNGS: Good A/E b/l, no wheezing, rales, rhonchi. No use of accessory muscles of respiration.  CARDIOVASCULAR: S1, S2 normal. No murmurs, rubs, or gallops.  ABDOMEN: Soft, nontender, nondistended. Bowel sounds present. No organomegaly or mass.  EXTREMITIES: No cyanosis, clubbing or edema b/l.      NEUROLOGIC: Cranial nerves II through XII are intact. No focal Motor or sensory deficits b/l. Globally weak   PSYCHIATRIC: The patient is alert and oriented x 3.  SKIN: No obvious rash, lesion, or ulcer.    LABORATORY PANEL:   CBC Recent Labs  Lab 12/01/17 0618  WBC 8.3  HGB 8.4*  HCT 27.3*  PLT 194   ------------------------------------------------------------------------------------------------------------------  Chemistries  Recent Labs  Lab 12/01/17 0618  NA 141  K 4.4  CL 99  CO2 31  GLUCOSE 205*  BUN 35*  CREATININE 1.85*  CALCIUM 8.7*   ------------------------------------------------------------------------------------------------------------------  Cardiac Enzymes Recent Labs  Lab 11/30/17 2321  TROPONINI 0.03*   ------------------------------------------------------------------------------------------------------------------  RADIOLOGY:  No results found.   ASSESSMENT AND PLAN:   77 year old female with past medical history of COPD, obesity, previous history of CVA, anxiety/depression, GERD, hypertension, history of seizures who presents to the hospital due to shortness of breath.  1.  Acute on chronic respiratory failure with hypoxia-secondary to COPD exacerbation. - Improving, continue IV steroids but will taper and switch to Oral Pred in a.m.  - cont. scheduled duo nebs, Pulmicort nebs. Improving and feels better.  2.  COPD exacerbation-this is the cause of patient's worsening respiratory failure with hypoxemia. - Continue IV steroids but will taper, cont. scheduled duo nebs, Pulmicort nebs.  Chest x-ray is negative for any acute pneumonia.  3.  History of seizures-no acute seizure type activity.  Continue Keppra.  4.  GERD-continue Protonix.  5.  Hyperglycemia-secondary to IV steroids. BS uncontrolled. Appreciate DM coordinator input.  - will add Novolog with meals, cont. SSI.  Cont. Tradjenta - cont. To  taper steroids.   6.  Essential  hypertension-continue Cardizem.  7.  History of previous CVA-continue Plavix.  8.  History of depression-continue Prozac.  9. Hyperlipidemia - cont. Atorvastatin  Awaiting Insurance authorization for placement to SNF/STR.    All the records are reviewed and case discussed with Care Management/Social Worker. Management plans discussed with the patient, family and they are in agreement.  CODE STATUS: Full code  DVT Prophylaxis: Hep SQ  TOTAL TIME TAKING CARE OF THIS PATIENT: 25 minutes.   POSSIBLE D/C IN 1-2 DAYS, DEPENDING ON CLINICAL CONDITION.   Henreitta Leber M.D on 12/03/2017 at 2:36 PM  Between 7am to 6pm - Pager - [phone]  After 6pm go to [email or web address] - Technical bre[employer] [facility]  Office  [phone]  CC: Primary care physician; Valerie Roys, DO

## 2017-12-03 NOTE — Clinical Social Work Note (Signed)
Patient has bed offer from [facility] and they are beginning prior auth. Patient's husband called and he is aware. Shela Leff MSW,LCSW [phone]

## 2017-12-04 ENCOUNTER — Inpatient Hospital Stay: Payer: Medicare HMO | Admitting: Family Medicine

## 2017-12-04 ENCOUNTER — Ambulatory Visit: Payer: Self-pay | Admitting: *Deleted

## 2017-12-04 LAB — GLUCOSE, CAPILLARY
GLUCOSE-CAPILLARY: 246 mg/dL — AB (ref 70–99)
GLUCOSE-CAPILLARY: 241 mg/dL — AB (ref 70–99)
Glucose-Capillary: 160 mg/dL — ABNORMAL HIGH (ref 70–99)
Glucose-Capillary: 232 mg/dL — ABNORMAL HIGH (ref 70–99)

## 2017-12-04 MED ORDER — PREDNISONE 50 MG PO TABS
50.0000 mg | ORAL_TABLET | Freq: Every day | ORAL | Status: DC
  Administered 2017-12-05: 50 mg via ORAL
  Filled 2017-12-04: qty 1

## 2017-12-04 MED ORDER — PREDNISONE 20 MG PO TABS: 40.0000 mg | ORAL_TABLET | Freq: Every day | ORAL | Status: DC

## 2017-12-04 MED ORDER — PREDNISONE 20 MG PO TABS: 30.0000 mg | ORAL_TABLET | Freq: Every day | ORAL | Status: DC

## 2017-12-04 MED ORDER — IPRATROPIUM-ALBUTEROL 0.5-2.5 (3) MG/3ML IN SOLN
3.0000 mL | Freq: Three times a day (TID) | RESPIRATORY_TRACT | Status: DC
  Administered 2017-12-04 – 2017-12-05 (×3): 3 mL via RESPIRATORY_TRACT
  Filled 2017-12-04 (×3): qty 3

## 2017-12-04 MED ORDER — PREDNISONE 20 MG PO TABS: 20.0000 mg | ORAL_TABLET | Freq: Every day | ORAL | Status: DC

## 2017-12-04 MED ORDER — PREDNISONE 20 MG PO TABS: 10.0000 mg | ORAL_TABLET | Freq: Every day | ORAL | Status: DC

## 2017-12-04 NOTE — Progress Notes (Addendum)
Physical Therapy Treatment Patient Details Name: Katherine [name] MRN: [id number] DOB: 1940-03-19 Today's Date: 12/04/2017    History of Present Illness Patient admitted with COPD exacerbation.  She has been a frequent re-admit and has recently come from SNF.  PMH includes, but is not limited to, COPD, stroke, seizures, OA, CHF and anxiety.    PT Comments    Pt reports feeling sleepy but willing to participate.  Participated in exercises as described below.  To edge of bed with HOB raised but no physical assist.  She was able to stand and transfer to recliner at bedside with min guard/assist +1.  PT on 3 lpm with O2 sats at rest 96% but decreased to 87% with transfer.  She returned to baseline with a short rest but declined further trials due to fatigue.  Pt progressing with mobility skills and overall decreased assist needed.  She remains limited by O2 needs and general fatigue.  SNF remains appropriate for discharge plan to return pt to prior level of function.   Follow Up Recommendations  SNF     Equipment Recommendations  None recommended by PT    Recommendations for Other Services       Precautions / Restrictions Precautions Precautions: Fall Restrictions Weight Bearing Restrictions: No    Mobility  Bed Mobility Overal bed mobility: Needs Assistance Bed Mobility: Supine to Sit     Supine to sit: Min guard;HOB elevated        Transfers Overall transfer level: Needs assistance Equipment used: Rolling walker (2 wheeled) Transfers: Sit to/from Stand Sit to Stand: Min assist;Min guard            Ambulation/Gait Ambulation/Gait assistance: Min guard;Min Web designer (Feet): 3 Feet Assistive device: Rolling walker (2 wheeled) Gait Pattern/deviations: Step-to pattern Gait velocity: decreased   General Gait Details: Less cumbersome for pt today, no LOB or buckling noted but limed by SOB - sats to 87% on 3 lpm and general fatigue.   Stairs              Wheelchair Mobility    Modified Rankin (Stroke Patients Only)       Balance   Sitting-balance support: Bilateral upper extremity supported;Feet supported Sitting balance-Leahy Scale: Good     Standing balance support: Bilateral upper extremity supported Standing balance-Leahy Scale: Fair Standing balance comment: reliant on walker                            Cognition Arousal/Alertness: Awake/alert Behavior During Therapy: WFL for tasks assessed/performed Overall Cognitive Status: Within Functional Limits for tasks assessed                                        Exercises Other Exercises Other Exercises: supine ankle pumps, heel slides, ab/add x 10 , seated LAQ, marches, pillow squeeze x 10.    General Comments        Pertinent Vitals/Pain Pain Assessment: No/denies pain    Home Living                      Prior Function            PT Goals (current goals can now be found in the care plan section) Progress towards PT goals: Progressing toward goals    Frequency    Min 2X/week  PT Plan Current plan remains appropriate    Co-evaluation              AM-PAC PT "6 Clicks" Daily Activity  Outcome Measure  Difficulty turning over in bed (including adjusting bedclothes, sheets and blankets)?: A Little Difficulty moving from lying on back to sitting on the side of the bed? : A Little Difficulty sitting down on and standing up from a chair with arms (e.g., wheelchair, bedside commode, etc,.)?: A Little Help needed moving to and from a bed to chair (including a wheelchair)?: A Little Help needed walking in hospital room?: A Little Help needed climbing 3-5 steps with a railing? : A Lot 6 Click Score: 17    End of Session Equipment Utilized During Treatment: Gait belt;Oxygen Activity Tolerance: Patient tolerated treatment well Patient left: in chair;with call bell/phone within reach;with chair alarm set          Time: 1050-1101 PT Time Calculation (min) (ACUTE ONLY): 11 min  Charges:  $Therapeutic Exercise: 8-22 mins                     Chesley Noon, PTA 12/04/17, 11:22 AM

## 2017-12-04 NOTE — Progress Notes (Signed)
FSBS 232, did not flow over to Epic.

## 2017-12-04 NOTE — Progress Notes (Signed)
[place] [facility] [place] NAME: Katherine [name]    MR#:  [id number]  DATE OF BIRTH:  [birth year]  SUBJECTIVE:   No acute events overnight. Awaiting Insurance authorization for STR placement.  No shortness of breath or chest pain.  REVIEW OF SYSTEMS:    Review of Systems  Constitutional: Negative for chills and fever.  HENT: Negative for congestion and tinnitus.   Eyes: Negative for blurred vision and double vision.  Respiratory: Negative for cough, shortness of breath and wheezing.   Cardiovascular: Negative for chest pain, orthopnea and PND.  Gastrointestinal: Negative for abdominal pain, diarrhea, nausea and vomiting.  Genitourinary: Negative for dysuria and hematuria.  Neurological: Negative for dizziness, sensory change and focal weakness.  All other systems reviewed and are negative.   Nutrition: Heart Healthy Tolerating Diet: Yes Tolerating PT: Eval noted.   DRUG ALLERGIES:   Allergies  Allergen Reactions  . Bee Venom Swelling  . Enalapril Maleate Swelling    VITALS:  Blood pressure 119/67, pulse 78, temperature 98.3 F (36.8 C), temperature source Oral, resp. rate 20, height 5\' 6"  (1.676 m), weight 123.8 kg, SpO2 95 %.  PHYSICAL EXAMINATION:   Physical Exam  GENERAL:  77 y.o.-year-old obese patient sitting up in chair in no acute distress.  EYES: Pupils equal, round, reactive to light and accommodation. No scleral icterus. Extraocular muscles intact.  HEENT: Head atraumatic, normocephalic. Oropharynx and nasopharynx clear.  NECK:  Supple, no jugular venous distention. No thyroid enlargement, no tenderness.  LUNGS: Good A/E b/l, no wheezing, rales, rhonchi. No use of accessory muscles of respiration.  CARDIOVASCULAR: S1, S2 normal. No murmurs, rubs, or gallops.  ABDOMEN: Soft, nontender, nondistended. Bowel sounds present. No organomegaly or mass.  EXTREMITIES: No cyanosis, clubbing or edema b/l.    NEUROLOGIC: Cranial  nerves II through XII are intact. No focal Motor or sensory deficits b/l. Globally weak   PSYCHIATRIC: The patient is alert and oriented x 3.  SKIN: No obvious rash, lesion, or ulcer.    LABORATORY PANEL:   CBC Recent Labs  Lab 12/01/17 0618  WBC 8.3  HGB 8.4*  HCT 27.3*  PLT 194   ------------------------------------------------------------------------------------------------------------------  Chemistries  Recent Labs  Lab 12/01/17 0618  NA 141  K 4.4  CL 99  CO2 31  GLUCOSE 205*  BUN 35*  CREATININE 1.85*  CALCIUM 8.7*   ------------------------------------------------------------------------------------------------------------------  Cardiac Enzymes Recent Labs  Lab 11/30/17 2321  TROPONINI 0.03*   ------------------------------------------------------------------------------------------------------------------  RADIOLOGY:  No results found.   ASSESSMENT AND PLAN:   77 year old female with past medical history of COPD, obesity, previous history of CVA, anxiety/depression, GERD, hypertension, history of seizures who presents to the hospital due to shortness of breath.  1.  Acute on chronic respiratory failure with hypoxia-secondary to COPD exacerbation. - much improved, will switch from IV Steroids to Oral Pred. Taper.  - cont. scheduled duo nebs, Pulmicort nebs.   2.  COPD exacerbation-this is the cause of patient's worsening respiratory failure with hypoxemia. -  Will switch to oral Pred taper today, cont. scheduled duo nebs, Pulmicort nebs.  Chest x-ray is negative for any acute pneumonia.  3.  History of seizures-no acute seizure type activity.  Continue Keppra.  4.  GERD-continue Protonix.  5.  Hyperglycemia-secondary to IV steroids. Improving. Appreciate DM coordinator input.  - will add Novolog with meals, cont. SSI.  Cont. Tradjenta - cont. To taper steroids.    6.  Essential hypertension-continue Cardizem.  7.  History of previous  CVA-continue Plavix.  8.  History of depression-continue Prozac.  9. Hyperlipidemia - cont. Atorvastatin  Awaiting Insurance authorization for placement to SNF/STR.    All the records are reviewed and case discussed with Care Management/Social Worker. Management plans discussed with the patient, family and they are in agreement.  CODE STATUS: Full code  DVT Prophylaxis: Hep SQ  TOTAL TIME TAKING CARE OF THIS PATIENT: 25 minutes.   POSSIBLE D/C IN 1-2 DAYS, DEPENDING ON CLINICAL CONDITION.   Henreitta Leber M.D on 12/04/2017 at 1:47 PM  Between 7am to 6pm - Pager - [phone]  After 6pm go to [email or web address] - Technical brewer [place] [facility]  Office  [phone]  CC: Primary care physician; Valerie Roys, DO

## 2017-12-04 NOTE — Plan of Care (Signed)
  Problem: Education: Goal: Knowledge of General Education information will improve Description Including pain rating scale, medication(s)/side effects and non-pharmacologic comfort measures Outcome: Progressing   Problem: Health Behavior/Discharge Planning: Goal: Ability to manage health-related needs will improve Outcome: Progressing   Problem: Clinical Measurements: Goal: Ability to maintain clinical measurements within normal limits will improve Outcome: Progressing Goal: Will remain free from infection Outcome: Progressing Goal: Diagnostic test results will improve Outcome: Progressing Goal: Respiratory complications will improve Outcome: Progressing Goal: Cardiovascular complication will be avoided Outcome: Progressing   Problem: Coping: Goal: Level of anxiety will decrease Outcome: Progressing   Problem: Nutrition: Goal: Adequate nutrition will be maintained Outcome: Progressing   Problem: Pain Managment: Goal: General experience of comfort will improve Outcome: Progressing   Problem: Safety: Goal: Ability to remain free from injury will improve Outcome: Progressing   Problem: Skin Integrity: Goal: Risk for impaired skin integrity will decrease Outcome: Progressing

## 2017-12-05 ENCOUNTER — Other Ambulatory Visit: Payer: Self-pay | Admitting: *Deleted

## 2017-12-05 DIAGNOSIS — J449 Chronic obstructive pulmonary disease, unspecified: Secondary | ICD-10-CM | POA: Diagnosis not present

## 2017-12-05 DIAGNOSIS — J441 Chronic obstructive pulmonary disease with (acute) exacerbation: Secondary | ICD-10-CM | POA: Diagnosis not present

## 2017-12-05 DIAGNOSIS — Z8673 Personal history of transient ischemic attack (TIA), and cerebral infarction without residual deficits: Secondary | ICD-10-CM | POA: Diagnosis not present

## 2017-12-05 DIAGNOSIS — N179 Acute kidney failure, unspecified: Secondary | ICD-10-CM | POA: Diagnosis not present

## 2017-12-05 DIAGNOSIS — E1159 Type 2 diabetes mellitus with other circulatory complications: Secondary | ICD-10-CM | POA: Diagnosis not present

## 2017-12-05 DIAGNOSIS — I110 Hypertensive heart disease with heart failure: Secondary | ICD-10-CM | POA: Diagnosis not present

## 2017-12-05 DIAGNOSIS — E875 Hyperkalemia: Secondary | ICD-10-CM | POA: Diagnosis not present

## 2017-12-05 DIAGNOSIS — Z96651 Presence of right artificial knee joint: Secondary | ICD-10-CM | POA: Diagnosis not present

## 2017-12-05 DIAGNOSIS — I130 Hypertensive heart and chronic kidney disease with heart failure and stage 1 through stage 4 chronic kidney disease, or unspecified chronic kidney disease: Secondary | ICD-10-CM | POA: Diagnosis not present

## 2017-12-05 DIAGNOSIS — J9611 Chronic respiratory failure with hypoxia: Secondary | ICD-10-CM | POA: Diagnosis not present

## 2017-12-05 DIAGNOSIS — F419 Anxiety disorder, unspecified: Secondary | ICD-10-CM | POA: Diagnosis not present

## 2017-12-05 DIAGNOSIS — N183 Chronic kidney disease, stage 3 (moderate): Secondary | ICD-10-CM | POA: Diagnosis not present

## 2017-12-05 DIAGNOSIS — I2510 Atherosclerotic heart disease of native coronary artery without angina pectoris: Secondary | ICD-10-CM | POA: Diagnosis not present

## 2017-12-05 DIAGNOSIS — G4733 Obstructive sleep apnea (adult) (pediatric): Secondary | ICD-10-CM | POA: Diagnosis not present

## 2017-12-05 DIAGNOSIS — Z79899 Other long term (current) drug therapy: Secondary | ICD-10-CM | POA: Diagnosis not present

## 2017-12-05 DIAGNOSIS — G4089 Other seizures: Secondary | ICD-10-CM | POA: Diagnosis not present

## 2017-12-05 DIAGNOSIS — J9621 Acute and chronic respiratory failure with hypoxia: Secondary | ICD-10-CM | POA: Diagnosis not present

## 2017-12-05 DIAGNOSIS — Z87891 Personal history of nicotine dependence: Secondary | ICD-10-CM | POA: Diagnosis not present

## 2017-12-05 DIAGNOSIS — R0600 Dyspnea, unspecified: Secondary | ICD-10-CM | POA: Diagnosis not present

## 2017-12-05 DIAGNOSIS — M25562 Pain in left knee: Secondary | ICD-10-CM | POA: Diagnosis not present

## 2017-12-05 DIAGNOSIS — E1122 Type 2 diabetes mellitus with diabetic chronic kidney disease: Secondary | ICD-10-CM | POA: Diagnosis not present

## 2017-12-05 DIAGNOSIS — R569 Unspecified convulsions: Secondary | ICD-10-CM | POA: Diagnosis not present

## 2017-12-05 DIAGNOSIS — Z9911 Dependence on respirator [ventilator] status: Secondary | ICD-10-CM | POA: Diagnosis not present

## 2017-12-05 DIAGNOSIS — I509 Heart failure, unspecified: Secondary | ICD-10-CM | POA: Diagnosis not present

## 2017-12-05 DIAGNOSIS — E7849 Other hyperlipidemia: Secondary | ICD-10-CM | POA: Diagnosis not present

## 2017-12-05 DIAGNOSIS — N3090 Cystitis, unspecified without hematuria: Secondary | ICD-10-CM | POA: Diagnosis not present

## 2017-12-05 DIAGNOSIS — M6281 Muscle weakness (generalized): Secondary | ICD-10-CM | POA: Diagnosis not present

## 2017-12-05 DIAGNOSIS — Z7984 Long term (current) use of oral hypoglycemic drugs: Secondary | ICD-10-CM | POA: Diagnosis not present

## 2017-12-05 DIAGNOSIS — Z9049 Acquired absence of other specified parts of digestive tract: Secondary | ICD-10-CM | POA: Diagnosis not present

## 2017-12-05 LAB — GLUCOSE, CAPILLARY
GLUCOSE-CAPILLARY: 145 mg/dL — AB (ref 70–99)
GLUCOSE-CAPILLARY: 186 mg/dL — AB (ref 70–99)

## 2017-12-05 MED ORDER — ALPRAZOLAM 0.5 MG PO TABS: 0.5000 mg | ORAL_TABLET | Freq: Two times a day (BID) | ORAL | 0 refills | Status: DC | PRN

## 2017-12-05 NOTE — Patient Outreach (Signed)
[facility]) Care Management  12/05/2017  Katherine [name] 1940/03/09 [id number]   Post Acute Care Coordination  Late Entry-This social worker arrived to [facility] yesterday to follow up on patient's rehab stay. This social worker spoke with Katherine [name] the Child psychotherapist stating that patient has been re-admitted to the hospital. Her husband came in on 12/03/17 to explain that he would like long term care for patient. Katherine [name] explained to him that they did not have any long term care beds at this time. She also explained to her spouse, that he would have to apply for long term care medicaid for patient.   Plan: This Education officer, museum will follow up with patient's spouse regarding patient's long term care needs.   Sheralyn Boatman [facility] Care Management [phone]

## 2017-12-05 NOTE — Discharge Summary (Signed)
[place] at [place] NAME: Katherine [name]    MR#:  [id number]  DATE OF BIRTH:  August 13, 1940  DATE OF ADMISSION:  11/29/2017 ADMITTING PHYSICIAN: Avel Peace Salary, MD  DATE OF DISCHARGE: 12/05/2017  PRIMARY CARE PHYSICIAN: Valerie Roys, DO    ADMISSION DIAGNOSIS:  COPD exacerbation (HCC) [J44.1] Acute renal failure, unspecified acute renal failure type ([place]) [N17.9]  DISCHARGE DIAGNOSIS:  Active Problems:   COPD (chronic obstructive pulmonary disease) (HCC)   COPD exacerbation (HCC)   SECONDARY DIAGNOSIS:   Past Medical History:  Diagnosis Date  . Angioedema   . Anxiety   . Anxiety and depression   . CHF (congestive heart failure) ([place])   . Chronic constipation   . Chronic kidney disease    stage 3  . COPD (chronic obstructive pulmonary disease) ([place])   . Gallstones   . GERD (gastroesophageal reflux disease)   . Hypertension   . Left ventricular hypertrophy   . Osteoarthritis   . Prurigo nodularis   . Seizures ([place])   . Stroke ([place])   . Tobacco abuse   . Vitamin D deficiency disease     HOSPITAL COURSE:   77 year old female with past medical history of COPD, obesity, previous history of CVA, anxiety/depression, GERD, hypertension, history of seizures who presents to the hospital due to shortness of breath.  1.  Acute on chronic respiratory failure with hypoxia-secondary to COPD exacerbation. Patient was treated with IV steroids, scheduled duo nebs, Pulmicort nebs.  Her wheezing and bronchospasm is improved. - Steroids are now being tapered to oral prednisone.  She will continue that along with her maintenance inhalers and her duo nebs as needed.  2.  COPD exacerbation-this was the cause of patient's worsening respiratory failure with hypoxemia. -Patient's chest x-ray did not show any evidence of pneumonia.  Patient was treated with IV steroids, scheduled duo nebs, Pulmicort nebs.  She has improved.  She is now being  discharged on oral prednisone taper.  She will continue her maintenance inhalers and her duo nebs as stated below.  3.  History of seizures- patient has had no acute seizure type activity while in the hospital she will continue her Keppra.  4.  GERD- patient will continue Protonix.  5.  DM type II w/ hyperglycemia - this was secondary to patient being on IV steroids.  Diabetes coronary consult was obtained.  Patient was maintained on some insulin while in the hospital.  Patient steroids are not being tapered.  Blood sugars are improving.  Patient will continue her Tradjenta.  6.  Essential hypertension- she will continue her Cardizem.  7.  History of previous CVA- she will continue her Plavix.  8.  History of depression- She will cont. continue Prozac.  9. Hyperlipidemia - she will cont. Atorvastatin  She was seen by physical therapy and given her worsening respiratory failure and deconditioning they recommend short-term rehab.  Patient is now being discharged to SNF/STR  DISCHARGE CONDITIONS:   Stable.   CONSULTS OBTAINED:    DRUG ALLERGIES:   Allergies  Allergen Reactions  . Bee Venom Swelling  . Enalapril Maleate Swelling    DISCHARGE MEDICATIONS:   Allergies as of 12/05/2017      Reactions   Bee Venom Swelling   Enalapril Maleate Swelling      Medication List    TAKE these medications   acetaminophen 650 MG CR tablet Commonly known as:  TYLENOL Take 650 mg by mouth See admin  instructions. Take 1 tablet (650MG ) by mouth 3 times daily as scheduled and every 8 hours as needed for pain   albuterol 108 (90 Base) MCG/ACT inhaler Commonly known as:  PROVENTIL HFA;VENTOLIN HFA Inhale 2 puffs into the lungs every 4 (four) hours as needed for wheezing or shortness of breath.   albuterol 0.63 MG/3ML nebulizer solution Commonly known as:  ACCUNEB Take 3 mLs by nebulization 3 (three) times daily as needed for wheezing.   ALPRAZolam 0.5 MG tablet Commonly known as:   XANAX Take 1 tablet (0.5 mg total) by mouth 2 (two) times daily as needed for anxiety.   atorvastatin 80 MG tablet Commonly known as:  LIPITOR Take 80 mg by mouth every evening.   cetirizine 10 MG tablet Commonly known as:  ZYRTEC Take 5 mg by mouth at bedtime.   cholecalciferol 1000 units tablet Commonly known as:  VITAMIN D Take 1,000 Units by mouth daily.   clopidogrel 75 MG tablet Commonly known as:  PLAVIX Take 75 mg by mouth daily.   diclofenac sodium 1 % Gel Commonly known as:  VOLTAREN Apply 2 g topically 4 (four) times daily as needed (pain).   diltiazem 120 MG 12 hr capsule Commonly known as:  CARDIZEM SR Take 120 mg by mouth 2 (two) times daily.   docusate sodium 100 MG capsule Commonly known as:  COLACE Take 100 mg by mouth daily.   FLUoxetine 10 MG capsule Commonly known as:  PROZAC Take 10 mg by mouth daily.   Fluticasone-Umeclidin-Vilant 100-62.5-25 MCG/INH Aepb Inhale 1 puff into the lungs daily.   furosemide 20 MG tablet Commonly known as:  LASIX Take 1 tablet (20 mg total) by mouth daily.   gabapentin 300 MG capsule Commonly known as:  NEURONTIN Take 300 mg by mouth at bedtime.   guaiFENesin 600 MG 12 hr tablet Commonly known as:  MUCINEX Take 1 tablet (600 mg total) by mouth 2 (two) times daily.   hydrALAZINE 25 MG tablet Commonly known as:  APRESOLINE Take 25 mg by mouth 3 (three) times daily.   ipratropium 0.03 % nasal spray Commonly known as:  ATROVENT Place 2 sprays into both nostrils every 12 (twelve) hours.   ipratropium-albuterol 0.5-2.5 (3) MG/3ML Soln Commonly known as:  DUONEB Take 3 mLs by nebulization every 6 (six) hours as needed (shortness of breath).   levETIRAcetam 500 MG 24 hr tablet Commonly known as:  KEPPRA XR Take 1 tablet (500 mg total) by mouth daily.   linagliptin 5 MG Tabs tablet Commonly known as:  TRADJENTA Take 5 mg by mouth daily.   meclizine 25 MG tablet Commonly known as:  ANTIVERT Take 25 mg by  mouth 2 (two) times daily as needed for dizziness.   methocarbamol 500 MG tablet Commonly known as:  ROBAXIN Take 500 mg by mouth every 8 (eight) hours as needed for muscle spasms.   multivitamin with minerals Tabs tablet Take 1 tablet by mouth daily.   pantoprazole 40 MG tablet Commonly known as:  PROTONIX Take 40 mg by mouth daily.   tiotropium 18 MCG inhalation capsule Commonly known as:  SPIRIVA Place 18 mcg into inhaler and inhale daily.   vitamin C 250 MG tablet Commonly known as:  ASCORBIC ACID Take 500 mg by mouth 2 (two) times daily.   zinc sulfate 220 (50 Zn) MG capsule Take 220 mg by mouth daily.         DISCHARGE INSTRUCTIONS:   DIET:  Cardiac diet and Diabetic diet  DISCHARGE CONDITION:  Stable  ACTIVITY:  Activity as tolerated  OXYGEN:  Home Oxygen: No.   Oxygen Delivery: room air  DISCHARGE LOCATION:  nursing home   If you experience worsening of your admission symptoms, develop shortness of breath, life threatening emergency, suicidal or homicidal thoughts you must seek medical attention immediately by calling 911 or calling your MD immediately  if symptoms less severe.  You Must read complete instructions/literature along with all the possible adverse reactions/side effects for all the Medicines you take and that have been prescribed to you. Take any new Medicines after you have completely understood and accpet all the possible adverse reactions/side effects.   Please note  You were cared for by a hospitalist during your hospital stay. If you have any questions about your discharge medications or the care you received while you were in the hospital after you are discharged, you can call the unit and asked to speak with the hospitalist on call if the hospitalist that took care of you is not available. Once you are discharged, your primary care physician will handle any further medical issues. Please note that NO REFILLS for any discharge  medications will be authorized once you are discharged, as it is imperative that you return to your primary care physician (or establish a relationship with a primary care physician if you do not have one) for your aftercare needs so that they can reassess your need for medications and monitor your lab values.     Today   No acute events overnight, respiratory status stable.  Will discharge to skilled nursing facility today.  VITAL SIGNS:  Blood pressure 129/89, pulse 77, temperature 98.6 F (37 C), resp. rate 16, height 5\' 6"  (1.676 m), weight 124.7 kg, SpO2 98 %.  I/O:    Intake/Output Summary (Last 24 hours) at 12/05/2017 1204 Last data filed at 12/05/2017 1100 Gross per 24 hour  Intake 240 ml  Output 1700 ml  Net -1460 ml    PHYSICAL EXAMINATION:   GENERAL:  77 y.o.-year-old obese patient sitting up in chair in no acute distress.  EYES: Pupils equal, round, reactive to light and accommodation. No scleral icterus. Extraocular muscles intact.  HEENT: Head atraumatic, normocephalic. Oropharynx and nasopharynx clear.  NECK:  Supple, no jugular venous distention. No thyroid enlargement, no tenderness.  LUNGS: Good A/E b/l, no wheezing, rales, rhonchi. No use of accessory muscles of respiration.  CARDIOVASCULAR: S1, S2 normal. No murmurs, rubs, or gallops.  ABDOMEN: Soft, nontender, nondistended. Bowel sounds present. No organomegaly or mass.  EXTREMITIES: No cyanosis, clubbing or edema b/l.    NEUROLOGIC: Cranial nerves II through XII are intact. No focal Motor or sensory deficits b/l. Globally weak   PSYCHIATRIC: The patient is alert and oriented x 3.  SKIN: No obvious rash, lesion, or ulcer.   DATA REVIEW:   CBC Recent Labs  Lab 12/01/17 0618  WBC 8.3  HGB 8.4*  HCT 27.3*  PLT 194    Chemistries  Recent Labs  Lab 12/01/17 0618  NA 141  K 4.4  CL 99  CO2 31  GLUCOSE 205*  BUN 35*  CREATININE 1.85*  CALCIUM 8.7*    Cardiac Enzymes Recent Labs  Lab  11/30/17 2321  TROPONINI 0.03*    Microbiology Results  Results for orders placed or performed during the hospital encounter of 11/21/17  Urine culture     Status: None   Collection Time: 11/22/17 12:50 AM  Result Value Ref Range Status   Specimen  Description   Final    URINE, CLEAN CATCH Performed at [facility], [address] [place], [place] [address]    Special Requests   Final    NONE Performed at [facility], [address] [place], [place] [address]    Culture   Final    NO GROWTH Performed at [facility], [place] [address] [place], [place] [address]    Report Status 11/23/2017 FINAL  Final    RADIOLOGY:  No results found.    Management plans discussed with the patient, family and they are in agreement.  CODE STATUS:     Code Status Orders  (From admission, onward)         Start     Ordered   11/29/17 2120  Full code  Continuous     11/29/17 2119          TOTAL TIME TAKING CARE OF THIS PATIENT: 40 minutes.    Henreitta Leber M.D on 12/05/2017 at 12:04 PM  Between 7am to 6pm - Pager - [phone]  After 6pm go to [email or web address] - Technical brewer [place] [facility]  Office  [phone]  CC: Primary care physician; Valerie Roys, DO

## 2017-12-05 NOTE — Clinical Social Work Note (Signed)
CSW has received call from [place] at [facility] and they have [place] for patient to go today. Then, Claiborne Billings states that they may not take her because she doesn't have a way to pay long term. Claiborne Billings then called the husband and he stated he had applied for medicaid. Claiborne Billings states she will need to verify this. Shela Leff MSW,LcSW [phone]

## 2017-12-05 NOTE — Clinical Social Work Placement (Signed)
   CLINICAL SOCIAL WORK PLACEMENT  NOTE  Date:  12/05/2017  Patient Details  Name: Katherine [name] MRN: [id number] Date of Birth: 1940/11/09  Clinical Social Work is seeking post-discharge placement for this patient at the [place] level of care (*CSW will initial, date and re-position this form in  chart as items are completed):  Yes   Patient/family provided with [place] Work Department's list of facilities offering this level of care within the geographic area requested by the patient (or if unable, by the patient's family).  Yes   Patient/family informed of their freedom to choose among providers that offer the needed level of care, that participate in Medicare, Medicaid or managed care program needed by the patient, have an available bed and are willing to accept the patient.  Yes   Patient/family informed of [facility]'s ownership interest in [facility] and [facility], as well as of the fact that they are under no obligation to receive care at these facilities.  PASRR submitted to EDS on       PASRR number received on       Existing PASRR number confirmed on 11/29/17     FL2 transmitted to all facilities in geographic area requested by pt/family on 11/29/17     FL2 transmitted to all facilities within larger geographic area on       Patient informed that his/her managed care company has contracts with or will negotiate with certain facilities, including the following:        Yes   Patient/family informed of bed offers received.  Patient chooses bed at [facility])     Physician recommends and patient chooses bed at [facility])    Patient to be transferred to [facility]) on 12/05/17.  Patient to be transferred to facility by (EMS)     Patient family notified on 12/05/17 of transfer.  Name of family member notified:  [name]: husband   PHYSICIAN       Additional Comment:     _______________________________________________ Shela Leff, LCSW 12/05/2017, 1:44 PM

## 2017-12-05 NOTE — Clinical Social Work Note (Signed)
CSW received call from [place] at [facility] and they are going to take patient. Discharge information sent. Nurse to call report and notify family. Patient to transport via EMS. Shela Leff MSW,LCSW [phone]

## 2017-12-05 NOTE — Progress Notes (Signed)
Husband called and notified of discharge. Report called to [facility]. Pt to transport via EMS. IV removed.

## 2017-12-05 NOTE — Care Management Important Message (Signed)
Copy of signed IM left with patient in room.  

## 2017-12-06 ENCOUNTER — Other Ambulatory Visit: Payer: Self-pay

## 2017-12-06 ENCOUNTER — Telehealth: Payer: Self-pay

## 2017-12-06 NOTE — Patient Outreach (Signed)
[facility]) Care Management  12/06/2017  Karimah Winquist [birth year] [id number]  Case Conference:   Multidisciplinary Case Discussion on the above patient completed today. No additional interventions or plans suggested for RN CM as Nursing program has been closed related to patients long term hospitalization/SNF stay. [facility] LCSW will continue to follow patient as needed.  Loc Feinstein E. Rollene Rotunda RN, BSN [facility] Care Management Coordinator [phone]

## 2017-12-06 NOTE — Telephone Encounter (Signed)
Called patients husband Bennie Hind to confirm patient went to [facility] facility after hospital discharge. [name] confirmed pt is residing at [facility] currently. Informed him patient does not need to come for hospital follow up on 12/14/2017 but she will need to be seen at office when she is discharged from [facility]. [name] will call when patient is discharged.

## 2017-12-07 DIAGNOSIS — I509 Heart failure, unspecified: Secondary | ICD-10-CM | POA: Diagnosis not present

## 2017-12-07 DIAGNOSIS — J9611 Chronic respiratory failure with hypoxia: Secondary | ICD-10-CM | POA: Diagnosis not present

## 2017-12-07 DIAGNOSIS — E1159 Type 2 diabetes mellitus with other circulatory complications: Secondary | ICD-10-CM | POA: Diagnosis not present

## 2017-12-07 DIAGNOSIS — J449 Chronic obstructive pulmonary disease, unspecified: Secondary | ICD-10-CM | POA: Diagnosis not present

## 2017-12-11 ENCOUNTER — Other Ambulatory Visit: Payer: Self-pay | Admitting: *Deleted

## 2017-12-11 DIAGNOSIS — M25562 Pain in left knee: Secondary | ICD-10-CM | POA: Diagnosis not present

## 2017-12-11 NOTE — Patient Outreach (Signed)
[facility]) Care Management  12/11/2017  Charmion Hapke 10/27/1940 [id number]   Post Acute Care Coordination Phone call to patient's spouse who confirms that patient will remain at [facility] on a long term basis. Per patient's spouse, he has filed for long term care medicaid and is waiting on approval. Per patient's spouse, " I cannot take care of her at home".  Patient's spouse declined needing any assistance with the long term care medicaid process.  Patient's spouse provided with this social worker's contact information if there are any needs in the future. This social worker will close patient to [facility] care management at this time.   Sheralyn Boatman [facility] Care Management [phone]

## 2017-12-11 NOTE — Patient Outreach (Signed)
[facility]) Care Management  12/11/2017  Jackeline Gutknecht 01-18-1941 [id number]   Post Acute Care Coordination  Phone call to discharge planner Ileene Patrick regarding patient's long term plan.Per York Cerise, there is no discharge date set, however patient's spouse has applied for ling term medicaid for patient. He plans to have her return home until she is approved by Medicaid.   Plan: This Education officer, museum will follow up with patient's spouse regarding patient's long term plans.   Sheralyn Boatman [facility] Care Management [phone]

## 2017-12-14 ENCOUNTER — Inpatient Hospital Stay: Payer: Self-pay | Admitting: Family Medicine

## 2017-12-18 ENCOUNTER — Other Ambulatory Visit: Payer: Self-pay

## 2017-12-18 ENCOUNTER — Ambulatory Visit: Payer: Medicare HMO | Admitting: Family Medicine

## 2017-12-18 ENCOUNTER — Emergency Department
Admission: EM | Admit: 2017-12-18 | Discharge: 2017-12-18 | Disposition: A | Discharge status: Home / Self Care | Payer: Medicare HMO | Attending: Emergency Medicine | Admitting: Emergency Medicine

## 2017-12-18 DIAGNOSIS — I110 Hypertensive heart disease with heart failure: Secondary | ICD-10-CM | POA: Diagnosis not present

## 2017-12-18 DIAGNOSIS — Z96651 Presence of right artificial knee joint: Secondary | ICD-10-CM | POA: Insufficient documentation

## 2017-12-18 DIAGNOSIS — I509 Heart failure, unspecified: Secondary | ICD-10-CM | POA: Diagnosis not present

## 2017-12-18 DIAGNOSIS — I2510 Atherosclerotic heart disease of native coronary artery without angina pectoris: Secondary | ICD-10-CM | POA: Diagnosis not present

## 2017-12-18 DIAGNOSIS — I130 Hypertensive heart and chronic kidney disease with heart failure and stage 1 through stage 4 chronic kidney disease, or unspecified chronic kidney disease: Secondary | ICD-10-CM | POA: Diagnosis not present

## 2017-12-18 DIAGNOSIS — J449 Chronic obstructive pulmonary disease, unspecified: Secondary | ICD-10-CM | POA: Diagnosis not present

## 2017-12-18 DIAGNOSIS — F419 Anxiety disorder, unspecified: Secondary | ICD-10-CM | POA: Insufficient documentation

## 2017-12-18 DIAGNOSIS — N3090 Cystitis, unspecified without hematuria: Secondary | ICD-10-CM | POA: Insufficient documentation

## 2017-12-18 DIAGNOSIS — N183 Chronic kidney disease, stage 3 (moderate): Secondary | ICD-10-CM | POA: Insufficient documentation

## 2017-12-18 DIAGNOSIS — Z87891 Personal history of nicotine dependence: Secondary | ICD-10-CM | POA: Insufficient documentation

## 2017-12-18 DIAGNOSIS — Z8673 Personal history of transient ischemic attack (TIA), and cerebral infarction without residual deficits: Secondary | ICD-10-CM | POA: Insufficient documentation

## 2017-12-18 DIAGNOSIS — Z79899 Other long term (current) drug therapy: Secondary | ICD-10-CM | POA: Insufficient documentation

## 2017-12-18 DIAGNOSIS — E1122 Type 2 diabetes mellitus with diabetic chronic kidney disease: Secondary | ICD-10-CM | POA: Insufficient documentation

## 2017-12-18 DIAGNOSIS — Z9049 Acquired absence of other specified parts of digestive tract: Secondary | ICD-10-CM | POA: Insufficient documentation

## 2017-12-18 DIAGNOSIS — Z7984 Long term (current) use of oral hypoglycemic drugs: Secondary | ICD-10-CM | POA: Insufficient documentation

## 2017-12-18 LAB — CBC WITH DIFFERENTIAL/PLATELET
Abs Immature Granulocytes: 0.02 10*3/uL (ref 0.00–0.07)
Basophils Absolute: 0 10*3/uL (ref 0.0–0.1)
Basophils Relative: 0 %
EOS PCT: 3 %
Eosinophils Absolute: 0.2 10*3/uL (ref 0.0–0.5)
HEMATOCRIT: 31 % — AB (ref 36.0–46.0)
HEMOGLOBIN: 9.6 g/dL — AB (ref 12.0–15.0)
Immature Granulocytes: 0 %
LYMPHS ABS: 1.3 10*3/uL (ref 0.7–4.0)
Lymphocytes Relative: 22 %
MCH: 27.6 pg (ref 26.0–34.0)
MCHC: 31 g/dL (ref 30.0–36.0)
MCV: 89.1 fL (ref 80.0–100.0)
MONO ABS: 0.5 10*3/uL (ref 0.1–1.0)
Monocytes Relative: 8 %
NRBC: 0 % (ref 0.0–0.2)
Neutro Abs: 3.9 10*3/uL (ref 1.7–7.7)
Neutrophils Relative %: 67 %
Platelets: 207 10*3/uL (ref 150–400)
RBC: 3.48 MIL/uL — AB (ref 3.87–5.11)
RDW: 20.2 % — ABNORMAL HIGH (ref 11.5–15.5)
WBC: 5.9 10*3/uL (ref 4.0–10.5)

## 2017-12-18 LAB — BASIC METABOLIC PANEL
Anion gap: 8 (ref 5–15)
BUN: 23 mg/dL (ref 8–23)
CHLORIDE: 107 mmol/L (ref 98–111)
CO2: 28 mmol/L (ref 22–32)
CREATININE: 1.74 mg/dL — AB (ref 0.44–1.00)
Calcium: 9.3 mg/dL (ref 8.9–10.3)
GFR, EST AFRICAN AMERICAN: 32 mL/min — AB (ref >60)
GFR, EST NON AFRICAN AMERICAN: 27 mL/min — AB (ref >60)
Glucose, Bld: 114 mg/dL — ABNORMAL HIGH (ref 70–99)
POTASSIUM: 4 mmol/L (ref 3.5–5.1)
SODIUM: 143 mmol/L (ref 135–145)

## 2017-12-18 LAB — URINALYSIS, COMPLETE (UACMP) WITH MICROSCOPIC
Bilirubin Urine: NEGATIVE
Glucose, UA: NEGATIVE mg/dL
Hgb urine dipstick: NEGATIVE
KETONES UR: NEGATIVE mg/dL
Nitrite: NEGATIVE
PROTEIN: NEGATIVE mg/dL
Specific Gravity, Urine: 1.009 (ref 1.005–1.030)
pH: 5 (ref 5.0–8.0)

## 2017-12-18 MED ORDER — LEVETIRACETAM 500 MG PO TABS
500.0000 mg | ORAL_TABLET | Freq: Once | ORAL | Status: AC
  Administered 2017-12-18: 500 mg via ORAL
  Filled 2017-12-18: qty 1

## 2017-12-18 MED ORDER — CEPHALEXIN 500 MG PO CAPS: 500.0000 mg | ORAL_CAPSULE | Freq: Three times a day (TID) | ORAL | 0 refills | Status: DC

## 2017-12-18 MED ORDER — FUROSEMIDE 10 MG/ML IJ SOLN
40.0000 mg | Freq: Once | INTRAMUSCULAR | Status: AC
  Administered 2017-12-18: 40 mg via INTRAVENOUS
  Filled 2017-12-18: qty 4

## 2017-12-18 MED ORDER — FUROSEMIDE 40 MG PO TABS: 40.0000 mg | ORAL_TABLET | Freq: Every day | ORAL | 0 refills | Status: DC

## 2017-12-18 MED ORDER — CEPHALEXIN 500 MG PO CAPS
500.0000 mg | ORAL_CAPSULE | Freq: Once | ORAL | Status: AC
  Administered 2017-12-18: 500 mg via ORAL
  Filled 2017-12-18: qty 1

## 2017-12-18 NOTE — ED Notes (Signed)
[facility] for transport to [facility]

## 2017-12-18 NOTE — ED Provider Notes (Signed)
[facility] Emergency Department Provider Note  ____________________________________________  Time seen: Approximately 9:19 PM  I have reviewed the triage vital signs and the nursing notes.   HISTORY  Chief Complaint Seizures  Level 5 Caveat: Portions of the History and Physical including HPI and review of systems are unable to be completely obtained due to patient being a poor historian    HPI Katherine [name] is a 77 y.o. female with a history of anxiety, depression, CKD, COPD, hypertension and seizures who is brought to the ED due to a suspected seizure at home.  She was given Ativan and remains seizure-free afterward.  She complains of abdominal pain.   Family also note that legs have been more swollen for the past week.  No increased difficulty breathing.  They note that the patient is not assisted to the bathroom when needed, not changed quickly when soiled which may predispose her to UTI.     Past Medical History:  Diagnosis Date  . Angioedema   . Anxiety   . Anxiety and depression   . CHF (congestive heart failure) ([place])   . Chronic constipation   . Chronic kidney disease    stage 3  . COPD (chronic obstructive pulmonary disease) ([place])   . Gallstones   . GERD (gastroesophageal reflux disease)   . Hypertension   . Left ventricular hypertrophy   . Osteoarthritis   . Prurigo nodularis   . Seizures ([place])   . Stroke ([place])   . Tobacco abuse   . Vitamin D deficiency disease      Patient Active Problem List   Diagnosis Date Noted  . COPD exacerbation ([place]) 11/30/2017  . Urinary tract infection 11/22/2017  . Acute respiratory distress 11/22/2017  . 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 Diabetes mellitus type 2, uncomplicated ([place]) 73/22/0254  . Acute on chronic respiratory failure with hypoxia ([place]) 11/10/2017  . Pressure injury of skin 10/29/2017  . Sepsis ([place]) 10/24/2017  . Acute kidney injury superimposed on CKD  ([place]) 10/24/2017  . Severe sepsis ([place]) 10/24/2017  . Possible Seizures ([place]) 10/08/2017  . Hyperlipemia 10/08/2017  . 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 Stroke-like episode ([place]) s/p IV tpa 10/04/2017  . Palliative care by specialist   . Elevated rheumatoid factor 09/05/2017  . Frequent hospital admissions 08/22/2017  . Aphasia 06/28/2017  . Hand pain 03/21/2017  . Dry skin 03/21/2017  . Pain in finger of left hand 09/12/2016  . Chronic fatigue 06/12/2016  . Left knee pain 06/12/2016  . Goals of care, counseling/discussion 03/13/2016  . Primary localized osteoarthritis of right knee 02/08/2016  . OSA (obstructive sleep apnea) 09/16/2015  . Rotator cuff syndrome 09/07/2015  . 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 Sleep disturbance 04/14/2015  . Coronary artery disease 03/14/2015  . 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 Anxiety   . Tobacco abuse   . Prurigo nodularis   . GERD (gastroesophageal reflux disease)   . COPD (chronic obstructive pulmonary disease) ([place])   . Osteoarthritis   . Vitamin D deficiency disease   . Chronic constipation   . CKD (chronic kidney disease), stage III ([place])   . LVH (left ventricular hypertrophy) 05/20/2013  . Essential hypertension 05/20/2013  . Morbid obesity ([place]) 05/20/2013     Past Surgical History:  Procedure Laterality Date  . CHOLECYSTECTOMY    . 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  Procedure: TOTAL KNEE ARTHROPLASTY;  Surgeon: Hessie Knows, MD;  Location: [facility] ORS;  Service: Orthopedics;  Laterality: Right;     Prior to Admission medications   Medication Sig Start Date End Date Taking? Authorizing Provider  acetaminophen (TYLENOL) 650 MG CR tablet Take 650 mg by mouth 3 (three) times daily.    Yes [provider]  acetaminophen (TYLENOL) 650 MG CR tablet Take 650 mg by mouth every 8 (eight) hours as needed for pain.   Yes  [provider]  albuterol (ACCUNEB) 0.63 MG/3ML nebulizer solution Take 3 mLs by nebulization every 8 (eight) hours as needed for wheezing.    Yes [provider]  albuterol (PROVENTIL HFA;VENTOLIN HFA) 108 (90 Base) MCG/ACT inhaler Inhale 2 puffs into the lungs every 4 (four) hours as needed for wheezing or shortness of breath.   Yes [provider]  atorvastatin (LIPITOR) 80 MG tablet Take 80 mg by mouth every evening.    Yes [provider]  cetirizine (ZYRTEC) 10 MG tablet Take 5 mg by mouth at bedtime.   Yes [provider]  cholecalciferol (VITAMIN D) 1000 units tablet Take 1,000 Units by mouth daily.   Yes [provider]  clopidogrel (PLAVIX) 75 MG tablet Take 75 mg by mouth daily.   Yes [provider]  diclofenac sodium (VOLTAREN) 1 % GEL Apply 2 g topically as needed (for left knee pain).    Yes [provider]  diltiazem (CARDIZEM SR) 120 MG 12 hr capsule Take 120 mg by mouth 2 (two) times daily.   Yes [provider]  docusate sodium (COLACE) 100 MG capsule Take 100 mg by mouth daily.   Yes [provider]  FLUoxetine (PROZAC) 10 MG capsule Take 10 mg by mouth daily.   Yes [provider]  Fluticasone-Umeclidin-Vilant 100-62.5-25 MCG/INH AEPB Inhale 1 puff into the lungs daily.   Yes [provider]  furosemide (LASIX) 20 MG tablet Take 1 tablet (20 mg total) by mouth daily. 11/14/17  Yes Salary, Avel Peace, MD  gabapentin (NEURONTIN) 300 MG capsule Take 300 mg by mouth at bedtime.   Yes [provider]  guaiFENesin (MUCINEX) 600 MG 12 hr tablet Take 1 tablet (600 mg total) by mouth 2 (two) times daily. 11/13/17  Yes Salary, Avel Peace, MD  hydrALAZINE (APRESOLINE) 25 MG tablet Take 25 mg by mouth 3 (three) times daily.   Yes [provider]  ipratropium (ATROVENT) 0.03 % nasal spray Place 2 sprays into both nostrils 2 (two) times daily.    Yes [provider]  ipratropium-albuterol (DUONEB) 0.5-2.5 (3) MG/3ML SOLN Take 3 mLs by nebulization every 6 (six) hours as needed (shortness of breath).   Yes [provider]  levETIRAcetam (KEPPRA XR) 500 MG 24 hr tablet Take 1 tablet (500 mg total) by mouth daily. 11/19/17  Yes Venancio Poisson, NP  linagliptin (TRADJENTA) 5 MG TABS tablet Take 5 mg by mouth daily.   Yes [provider]  LORazepam (ATIVAN) 2 MG/ML injection Inject 1 mg into the muscle as needed for seizure. 12/18/17  Yes [provider]  meclizine (ANTIVERT) 25 MG tablet Take 25 mg by mouth every 12 (twelve) hours as needed for dizziness.    Yes [provider]  methocarbamol (ROBAXIN) 500 MG tablet Take 500 mg by mouth every 8 (eight) hours as needed for muscle spasms.   Yes [provider]  Multiple Vitamin (MULTIVITAMIN WITH MINERALS) TABS tablet Take 1 tablet by mouth daily.  Yes [provider]  pantoprazole (PROTONIX) 40 MG tablet Take 40 mg by mouth daily.   Yes [provider]  vitamin C (ASCORBIC ACID) 500 MG tablet Take 500 mg by mouth 2 (two) times daily.   Yes [provider]  zinc sulfate 220 (50 Zn) MG capsule Take 220 mg by mouth daily.   Yes [provider]  ALPRAZolam (XANAX) 0.5 MG tablet Take 1 tablet (0.5 mg total) by mouth 2 (two) times daily as needed for anxiety. Patient not taking: Reported on 12/18/2017 12/05/17   Henreitta Leber, MD  cephALEXin (KEFLEX) 500 MG capsule Take 1 capsule (500 mg total) by mouth 3 (three) times daily. 12/18/17   Carrie Mew, MD  furosemide (LASIX) 40 MG tablet Take 1 tablet (40 mg total) by mouth daily for 7 days. 12/18/17 12/25/17  Carrie Mew, MD     Allergies Bee venom and Enalapril maleate   Family History  Problem Relation Age of Onset  . Alcohol abuse Mother   . Sickle cell anemia Daughter   . Hypertension Son   . Cancer Neg Hx   . COPD Neg Hx   . Diabetes Neg Hx   . Heart disease  Neg Hx   . Stroke Neg Hx     Social History Social History   Tobacco Use  . Smoking status: Former Smoker    Packs/day: 0.50    Years: 60.00    Pack years: 30.00    Types: Cigarettes  . Smokeless tobacco: Never Used  Substance Use Topics  . Alcohol use: No    Alcohol/week: 0.0 standard drinks    Comment: rare  . Drug use: No    Review of Systems  Constitutional:   No fever or chills.  ENT:   No sore throat. No rhinorrhea. Cardiovascular:   No chest pain or syncope. Respiratory:   Chronic shortness of breath.  No cough. Gastrointestinal:   Negative for abdominal pain, vomiting and diarrhea.  Musculoskeletal:   Chronic leg swelling All other systems reviewed and are negative except as documented above in ROS and HPI.  ____________________________________________   PHYSICAL EXAM:  VITAL SIGNS: ED Triage Vitals  Enc Vitals Group     BP 12/18/17 1841 107/63     Pulse Rate 12/18/17 1841 85     Resp 12/18/17 1841 (!) 22     Temp 12/18/17 1841 98.4 F (36.9 C)     Temp Source 12/18/17 1841 Oral     SpO2 12/18/17 1837 98 %     Weight 12/18/17 1841 281 lb 12 oz (127.8 kg)     Height 12/18/17 1841 5\' 6"  (1.676 m)     Head Circumference --      Peak Flow --      Pain Score 12/18/17 1841 0     Pain Loc --      Pain Edu? --      Excl. in [place]? --     Vital signs reviewed, nursing assessments reviewed.   Constitutional:   Alert and oriented. Non-toxic appearance. Eyes:   Conjunctivae are normal. EOMI. PERRL. ENT      Head:   Normocephalic and atraumatic.      Nose:   No congestion/rhinnorhea.       Mouth/Throat:   MMM, no pharyngeal erythema. No peritonsillar mass.       Neck:   No meningismus. Full ROM. Hematological/Lymphatic/Immunilogical:   No cervical lymphadenopathy. Cardiovascular:   RRR. Symmetric bilateral radial and DP pulses.  No murmurs. Cap refill less than 2 seconds. Respiratory:   Normal respiratory effort without tachypnea/retractions. Breath sounds  are clear and equal bilaterally. No wheezes/rales/rhonchi. Gastrointestinal:   Soft with lower abdominal tenderness. Non distended. There is no CVA tenderness.  No rebound, rigidity, or guarding.  Musculoskeletal:   Normal range of motion in all extremities. No joint effusions.  No lower extremity tenderness.  No edema. Neurologic:   Normal speech and language.  Motor grossly intact. No acute focal neurologic deficits are appreciated.  Skin:    Skin is warm, dry and intact. No rash noted.  No petechiae, purpura, or bullae.  ____________________________________________    LABS (pertinent positives/negatives) (all labs ordered are listed, but only abnormal results are displayed) Labs Reviewed  BASIC METABOLIC PANEL - Abnormal; Notable for the following components:      Result Value   Glucose, Bld 114 (*)    Creatinine, Ser 1.74 (*)    GFR calc non Af Amer 27 (*)    GFR calc Af Amer 32 (*)    All other components within normal limits  CBC WITH DIFFERENTIAL/PLATELET - Abnormal; Notable for the following components:   RBC 3.48 (*)    Hemoglobin 9.6 (*)    HCT 31.0 (*)    RDW 20.2 (*)    All other components within normal limits  URINALYSIS, COMPLETE (UACMP) WITH MICROSCOPIC - Abnormal; Notable for the following components:   Color, Urine YELLOW (*)    APPearance HAZY (*)    Leukocytes, UA LARGE (*)    WBC, UA >50 (*)    Bacteria, UA RARE (*)    All other components within normal limits  URINE CULTURE   ____________________________________________   EKG    ____________________________________________    RADIOLOGY  No results found.  ____________________________________________   PROCEDURES Procedures  ____________________________________________  DIFFERENTIAL DIAGNOSIS   UTI, electrolyte disturbance, seizure, dehydration  CLINICAL IMPRESSION / ASSESSMENT AND PLAN / ED COURSE  Pertinent labs & imaging results that were available during my care of the patient  were reviewed by me and considered in my medical decision making (see chart for details).    Patient presents with seizure that happened today.  Return to baseline here in the ED.  Work-up shows a urinary tract infection which likely is causing an exacerbation of underlying seizure disorder.  Given additional dose of Keppra as well as start on Keflex course.  Urine culture sent.  Appears back to baseline and stable for discharge back to her nursing home.  For her increased leg swelling, the nursing home [facility] does confirm that she is getting her daily dose of Lasix 20 mg.  I will increase that to 40 mg as well as given IV dose here in the ED.  Recommend close follow-up with the heart failure clinic.  Breathing is at baseline, sats are normal on her usual 2 L nasal cannula, not needing additional intervention or hospitalization at this time.      ____________________________________________   FINAL CLINICAL IMPRESSION(S) / ED DIAGNOSES    Final diagnoses:  Cystitis  Chronic heart failure   ED Discharge Orders         Ordered    cephALEXin (KEFLEX) 500 MG capsule  3 times daily     12/18/17 2117    furosemide (LASIX) 40 MG tablet  Daily     12/18/17 2141          Portions of this note were generated with dragon dictation software. Dictation errors may  occur despite best attempts at proofreading.    Carrie Mew, MD 12/18/17 2144

## 2017-12-18 NOTE — ED Notes (Signed)
[facility] called for transport to [facility]

## 2017-12-18 NOTE — ED Triage Notes (Signed)
Pt arrived via EMS from [facility] after having a seizure. Pt received 0.5mg  of Ativan at 1630. Hx of COPD and seizures that she takes Kell for.

## 2017-12-18 NOTE — Discharge Instructions (Addendum)
Your lab test today show a urinary tract infection.  Take antibiotics as prescribed and continue taking all your other home medications.  Follow-up with your doctor this week for continued monitoring of your symptoms.  For your leg swelling, increase the Lasix to 40 mg daily for the next 7 days.  Afterward, return to your usual 20 mg dose.  Follow-up with the heart failure clinic for further monitoring of the symptoms.

## 2017-12-20 LAB — URINE CULTURE

## 2017-12-21 ENCOUNTER — Ambulatory Visit: Payer: Medicare HMO | Admitting: Family

## 2017-12-21 DIAGNOSIS — I509 Heart failure, unspecified: Secondary | ICD-10-CM | POA: Diagnosis not present

## 2017-12-21 DIAGNOSIS — J9611 Chronic respiratory failure with hypoxia: Secondary | ICD-10-CM | POA: Diagnosis not present

## 2017-12-21 DIAGNOSIS — J449 Chronic obstructive pulmonary disease, unspecified: Secondary | ICD-10-CM | POA: Diagnosis not present

## 2017-12-24 ENCOUNTER — Emergency Department
Admission: EM | Admit: 2017-12-24 | Discharge: 2017-12-24 | Disposition: A | Discharge status: Home / Self Care | Payer: Medicare HMO | Attending: Emergency Medicine | Admitting: Emergency Medicine

## 2017-12-24 ENCOUNTER — Other Ambulatory Visit: Payer: Self-pay

## 2017-12-24 ENCOUNTER — Emergency Department: Payer: Medicare HMO

## 2017-12-24 DIAGNOSIS — J441 Chronic obstructive pulmonary disease with (acute) exacerbation: Secondary | ICD-10-CM | POA: Diagnosis not present

## 2017-12-24 DIAGNOSIS — R040 Epistaxis: Secondary | ICD-10-CM | POA: Insufficient documentation

## 2017-12-24 DIAGNOSIS — R042 Hemoptysis: Secondary | ICD-10-CM | POA: Diagnosis not present

## 2017-12-24 DIAGNOSIS — J449 Chronic obstructive pulmonary disease, unspecified: Secondary | ICD-10-CM | POA: Insufficient documentation

## 2017-12-24 DIAGNOSIS — R0789 Other chest pain: Secondary | ICD-10-CM | POA: Diagnosis not present

## 2017-12-24 DIAGNOSIS — N183 Chronic kidney disease, stage 3 (moderate): Secondary | ICD-10-CM | POA: Insufficient documentation

## 2017-12-24 DIAGNOSIS — Z9049 Acquired absence of other specified parts of digestive tract: Secondary | ICD-10-CM | POA: Insufficient documentation

## 2017-12-24 DIAGNOSIS — R062 Wheezing: Secondary | ICD-10-CM | POA: Insufficient documentation

## 2017-12-24 DIAGNOSIS — Z8673 Personal history of transient ischemic attack (TIA), and cerebral infarction without residual deficits: Secondary | ICD-10-CM | POA: Diagnosis not present

## 2017-12-24 DIAGNOSIS — I2510 Atherosclerotic heart disease of native coronary artery without angina pectoris: Secondary | ICD-10-CM | POA: Diagnosis not present

## 2017-12-24 DIAGNOSIS — K8020 Calculus of gallbladder without cholecystitis without obstruction: Secondary | ICD-10-CM | POA: Diagnosis not present

## 2017-12-24 DIAGNOSIS — R0600 Dyspnea, unspecified: Secondary | ICD-10-CM | POA: Diagnosis not present

## 2017-12-24 DIAGNOSIS — Z7902 Long term (current) use of antithrombotics/antiplatelets: Secondary | ICD-10-CM | POA: Diagnosis not present

## 2017-12-24 DIAGNOSIS — I509 Heart failure, unspecified: Secondary | ICD-10-CM | POA: Diagnosis not present

## 2017-12-24 DIAGNOSIS — Z79899 Other long term (current) drug therapy: Secondary | ICD-10-CM | POA: Diagnosis not present

## 2017-12-24 DIAGNOSIS — F329 Major depressive disorder, single episode, unspecified: Secondary | ICD-10-CM | POA: Diagnosis not present

## 2017-12-24 DIAGNOSIS — E1122 Type 2 diabetes mellitus with diabetic chronic kidney disease: Secondary | ICD-10-CM | POA: Insufficient documentation

## 2017-12-24 DIAGNOSIS — Z7984 Long term (current) use of oral hypoglycemic drugs: Secondary | ICD-10-CM | POA: Diagnosis not present

## 2017-12-24 DIAGNOSIS — F419 Anxiety disorder, unspecified: Secondary | ICD-10-CM | POA: Diagnosis not present

## 2017-12-24 DIAGNOSIS — I130 Hypertensive heart and chronic kidney disease with heart failure and stage 1 through stage 4 chronic kidney disease, or unspecified chronic kidney disease: Secondary | ICD-10-CM | POA: Insufficient documentation

## 2017-12-24 DIAGNOSIS — R079 Chest pain, unspecified: Secondary | ICD-10-CM | POA: Diagnosis not present

## 2017-12-24 DIAGNOSIS — Z87891 Personal history of nicotine dependence: Secondary | ICD-10-CM | POA: Diagnosis not present

## 2017-12-24 DIAGNOSIS — Z96651 Presence of right artificial knee joint: Secondary | ICD-10-CM | POA: Diagnosis not present

## 2017-12-24 DIAGNOSIS — Z9911 Dependence on respirator [ventilator] status: Secondary | ICD-10-CM | POA: Diagnosis not present

## 2017-12-24 LAB — CBC WITH DIFFERENTIAL/PLATELET
ABS IMMATURE GRANULOCYTES: 0.02 10*3/uL (ref 0.00–0.07)
Basophils Absolute: 0 10*3/uL (ref 0.0–0.1)
Basophils Relative: 0 %
Eosinophils Absolute: 0.1 10*3/uL (ref 0.0–0.5)
Eosinophils Relative: 2 %
HEMATOCRIT: 31.8 % — AB (ref 36.0–46.0)
HEMOGLOBIN: 9.7 g/dL — AB (ref 12.0–15.0)
Immature Granulocytes: 0 %
LYMPHS ABS: 1.3 10*3/uL (ref 0.7–4.0)
LYMPHS PCT: 25 %
MCH: 27 pg (ref 26.0–34.0)
MCHC: 30.5 g/dL (ref 30.0–36.0)
MCV: 88.6 fL (ref 80.0–100.0)
MONO ABS: 0.5 10*3/uL (ref 0.1–1.0)
MONOS PCT: 10 %
Neutro Abs: 3.2 10*3/uL (ref 1.7–7.7)
Neutrophils Relative %: 63 %
Platelets: 267 10*3/uL (ref 150–400)
RBC: 3.59 MIL/uL — AB (ref 3.87–5.11)
RDW: 19.9 % — ABNORMAL HIGH (ref 11.5–15.5)
WBC: 5.2 10*3/uL (ref 4.0–10.5)
nRBC: 0 % (ref 0.0–0.2)

## 2017-12-24 LAB — BASIC METABOLIC PANEL
Anion gap: 10 (ref 5–15)
BUN: 28 mg/dL — AB (ref 8–23)
CHLORIDE: 100 mmol/L (ref 98–111)
CO2: 33 mmol/L — AB (ref 22–32)
Calcium: 9.6 mg/dL (ref 8.9–10.3)
Creatinine, Ser: 1.71 mg/dL — ABNORMAL HIGH (ref 0.44–1.00)
GFR calc Af Amer: 32 mL/min — ABNORMAL LOW (ref >60)
GFR calc non Af Amer: 28 mL/min — ABNORMAL LOW (ref >60)
Glucose, Bld: 104 mg/dL — ABNORMAL HIGH (ref 70–99)
POTASSIUM: 3.8 mmol/L (ref 3.5–5.1)
SODIUM: 143 mmol/L (ref 135–145)

## 2017-12-24 LAB — TROPONIN I

## 2017-12-24 MED ORDER — ALBUTEROL SULFATE (2.5 MG/3ML) 0.083% IN NEBU
2.5000 mg | INHALATION_SOLUTION | Freq: Once | RESPIRATORY_TRACT | Status: AC
  Administered 2017-12-24: 2.5 mg via RESPIRATORY_TRACT
  Filled 2017-12-24: qty 3

## 2017-12-24 MED ORDER — ACETAMINOPHEN 325 MG PO TABS
650.0000 mg | ORAL_TABLET | Freq: Once | ORAL | Status: AC
  Administered 2017-12-24: 650 mg via ORAL
  Filled 2017-12-24: qty 2

## 2017-12-24 NOTE — ED Notes (Signed)
Diaper changed

## 2017-12-24 NOTE — ED Notes (Signed)
Attempted to call report to [facility] and unable to reach anyone.

## 2017-12-24 NOTE — ED Provider Notes (Addendum)
[facility] Emergency Department Provider Note  ___________________________________________   First MD Initiated Contact with Patient 12/24/17 1846     (approximate)  I have reviewed the triage vital signs and the nursing notes.   HISTORY  Chief Complaint Cough   HPI Katherine [name] is a 77 y.o. female with a history of CHF, chronic kidney disease as well as COPD on 2 L nasal cannula oxygen chronically who was presented to the emergency department today complaining of a nosebleed as well as coughing up a small amount of blood x1.  D-dimer was sent at her skilled nursing facility which was elevated and she was sent to the emergency department for further work-up and treatment.  Patient at this time denies any chest pain or shortness of breath.     Past Medical History:  Diagnosis Date  . Angioedema   . Anxiety   . Anxiety and depression   . CHF (congestive heart failure) ([place])   . Chronic constipation   . Chronic kidney disease    stage 3  . COPD (chronic obstructive pulmonary disease) ([place])   . Gallstones   . GERD (gastroesophageal reflux disease)   . Hypertension   . Left ventricular hypertrophy   . Osteoarthritis   . Prurigo nodularis   . Seizures ([place])   . Stroke ([place])   . Tobacco abuse   . Vitamin D deficiency disease     Patient Active Problem List   Diagnosis Date Noted  . COPD exacerbation ([place]) 11/30/2017  . Urinary tract infection 11/22/2017  . Acute respiratory distress 11/22/2017  . 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[place]) 11/16/2017  . Diabetes mellitus type 2, uncomplicated ([place]) 27/08/8673  . Acute on chronic respiratory failure with hypoxia ([place]) 11/10/2017  . Pressure injury of skin 10/29/2017  . Sepsis ([place]) 10/24/2017  . Acute kidney injury superimposed on CKD ([place]) 10/24/2017  . Severe sepsis ([place]) 10/24/2017  . Possible Seizures ([place]) 10/08/2017  . Hyperlipemia 10/08/2017  . Aneurysm of anterior Com cerebral  artery 10/08/2017  . Stroke-like episode ([place]) s/p IV tpa 10/04/2017  . Palliative care by specialist   . Elevated rheumatoid factor 09/05/2017  . Frequent hospital admissions 08/22/2017  . Aphasia 06/28/2017  . Hand pain 03/21/2017  . Dry skin 03/21/2017  . Pain in finger of left hand 09/12/2016  . Chronic fatigue 06/12/2016  . Left knee pain 06/12/2016  . Goals of care, counseling/discussion 03/13/2016  . Primary localized osteoarthritis of right knee 02/08/2016  . OSA (obstructive sleep apnea) 09/16/2015  . Rotator cuff syndrome 09/07/2015  . 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 Sleep disturbance 04/14/2015  . Coronary artery disease 03/14/2015  . 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 Anxiety   . Tobacco abuse   . Prurigo nodularis   . GERD (gastroesophageal reflux disease)   . COPD (chronic obstructive pulmonary disease) ([place])   . Osteoarthritis   . Vitamin D deficiency disease   . Chronic constipation   . CKD (chronic kidney disease), stage III ([place])   . LVH (left ventricular hypertrophy) 05/20/2013  . Essential hypertension 05/20/2013  . Morbid obesity ([place]) 05/20/2013    Past Surgical History:  Procedure Laterality Date  . CHOLECYSTECTOMY    . 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   Procedure: TOTAL KNEE ARTHROPLASTY;  Surgeon: Hessie Knows, MD;  Location: [facility] ORS;  Service: Orthopedics;  Laterality: Right;    Prior to  Admission medications   Medication Sig Start Date End Date Taking? Authorizing Provider  acetaminophen (TYLENOL) 650 MG CR tablet Take 650 mg by mouth 3 (three) times daily.    Yes [provider]  acetaminophen (TYLENOL) 650 MG CR tablet Take 650 mg by mouth every 8 (eight) hours as needed for pain.   Yes [provider]  albuterol (ACCUNEB) 0.63 MG/3ML nebulizer solution Take 3 mLs by nebulization every 8 (eight) hours as needed for wheezing.    Yes  [provider]  albuterol (PROVENTIL HFA;VENTOLIN HFA) 108 (90 Base) MCG/ACT inhaler Inhale 2 puffs into the lungs every 4 (four) hours as needed for wheezing or shortness of breath.   Yes [provider]  atorvastatin (LIPITOR) 80 MG tablet Take 80 mg by mouth every evening.    Yes [provider]  cephALEXin (KEFLEX) 500 MG capsule Take 1 capsule (500 mg total) by mouth 3 (three) times daily. 12/18/17  Yes Carrie Mew, MD  cetirizine (ZYRTEC) 10 MG tablet Take 5 mg by mouth at bedtime.   Yes [provider]  cholecalciferol (VITAMIN D) 1000 units tablet Take 1,000 Units by mouth daily.   Yes [provider]  clopidogrel (PLAVIX) 75 MG tablet Take 75 mg by mouth daily.   Yes [provider]  diclofenac sodium (VOLTAREN) 1 % GEL Apply 2 g topically as needed (for left knee pain).    Yes [provider]  diltiazem (CARDIZEM SR) 120 MG 12 hr capsule Take 120 mg by mouth 2 (two) times daily.   Yes [provider]  docusate sodium (COLACE) 100 MG capsule Take 100 mg by mouth daily.   Yes [provider]  FLUoxetine (PROZAC) 10 MG capsule Take 10 mg by mouth daily.   Yes [provider]  Fluticasone-Umeclidin-Vilant 100-62.5-25 MCG/INH AEPB Inhale 1 puff into the lungs daily.   Yes [provider]  furosemide (LASIX) 20 MG tablet Take 1 tablet (20 mg total) by mouth daily. 11/14/17  Yes Salary, Avel Peace, MD  furosemide (LASIX) 40 MG tablet Take 1 tablet (40 mg total) by mouth daily for 7 days. 12/18/17 12/25/17 Yes Carrie Mew, MD  gabapentin (NEURONTIN) 300 MG capsule Take 300 mg by mouth at bedtime.   Yes [provider]  guaiFENesin (MUCINEX) 600 MG 12 hr tablet Take 1 tablet (600 mg total) by mouth 2 (two) times daily. 11/13/17  Yes Salary, Avel Peace, MD  hydrALAZINE (APRESOLINE) 25 MG tablet Take 25 mg by mouth 3 (three) times daily.   Yes [provider]  ipratropium  (ATROVENT) 0.03 % nasal spray Place 2 sprays into both nostrils 2 (two) times daily.    Yes [provider]  ipratropium-albuterol (DUONEB) 0.5-2.5 (3) MG/3ML SOLN Take 3 mLs by nebulization 2 (two) times daily.    Yes [provider]  levETIRAcetam (KEPPRA XR) 500 MG 24 hr tablet Take 1 tablet (500 mg total) by mouth daily. 11/19/17  Yes Venancio Poisson, NP  linagliptin (TRADJENTA) 5 MG TABS tablet Take 5 mg by mouth daily.   Yes [provider]  LORazepam (ATIVAN) 2 MG/ML injection Inject 1 mg into the muscle as needed for seizure. 12/18/17  Yes [provider]  meclizine (ANTIVERT) 25 MG tablet Take 25 mg by mouth every 12 (twelve) hours as needed for dizziness.    Yes [provider]  Multiple Vitamin (MULTIVITAMIN WITH MINERALS) TABS tablet Take 1 tablet by mouth daily.   Yes [provider]  pantoprazole (PROTONIX) 40 MG tablet Take 40 mg by mouth daily.   Yes [provider]  vitamin C (ASCORBIC ACID) 500 MG tablet Take 500 mg by mouth 2 (two) times daily.   Yes [provider]  zinc sulfate 220 (50 Zn) MG capsule Take 220 mg by mouth daily.   Yes [provider]  ALPRAZolam (XANAX) 0.5 MG tablet Take 1 tablet (0.5 mg total) by mouth 2 (two) times daily as needed for anxiety. Patient not taking: Reported on 12/18/2017 12/05/17   Henreitta Leber, MD    Allergies Bee venom and Enalapril maleate  Family History  Problem Relation Age of Onset  . Alcohol abuse Mother   . Sickle cell anemia Daughter   . Hypertension Son   . Cancer Neg Hx   . COPD Neg Hx   . Diabetes Neg Hx   . Heart disease Neg Hx   . Stroke Neg Hx     Social History Social History   Tobacco Use  . Smoking status: Former Smoker    Packs/day: 0.50    Years: 60.00    Pack years: 30.00    Types: Cigarettes  . Smokeless tobacco: Never Used  Substance Use Topics  . Alcohol use: No    Alcohol/week: 0.0 standard drinks    Comment:  rare  . Drug use: No    Review of Systems  Constitutional: No fever/chills Eyes: No visual changes. ENT: No sore throat. Cardiovascular: Denies chest pain. Respiratory: Denies shortness of breath. Gastrointestinal: No abdominal pain.  No nausea, no vomiting.  No diarrhea.  No constipation. Genitourinary: Negative for dysuria. Musculoskeletal: Negative for back pain. Skin: Negative for rash. Neurological: Negative for headaches, focal weakness or numbness.   ____________________________________________   PHYSICAL EXAM:  VITAL SIGNS: ED Triage Vitals  Enc Vitals Group     BP 12/24/17 1902 125/78     Pulse Rate 12/24/17 1902 94     Resp 12/24/17 1902 (!) 29     Temp 12/24/17 1902 98.9 F (37.2 C)     Temp Source 12/24/17 1902 Oral     SpO2 12/24/17 1902 97 %     Weight 12/24/17 1903 281 lb 12 oz (127.8 kg)     Height 12/24/17 1903 5\' 5"  (1.651 m)     Head Circumference --      Peak Flow --      Pain Score 12/24/17 1903 0     Pain Loc --      Pain Edu? --      Excl. in [place]? --     Constitutional: Alert and oriented. Well appearing and in no acute distress. Eyes: Conjunctivae are normal.  Head: Atraumatic. Nose: No congestion/rhinnorhea.  Wearing nasal cannula oxygen.  Clotted blood to the right nare without any active bleeding. Mouth/Throat: Mucous membranes are moist.  No active bleeding visualized in the posterior pharynx. Neck: No stridor.   Cardiovascular: Normal rate, regular rhythm. Grossly normal heart sounds.   Respiratory: Normal respiratory effort.  No retractions. Lungs CTAB. Gastrointestinal: Soft and nontender. No distention.  Scant wheezing throughout all fields with the patient says is her baseline. Musculoskeletal: No lower extremity tenderness nor edema.  No joint effusions. Neurologic:  Normal speech and language. No gross focal neurologic deficits are appreciated. Skin:  Skin is warm, dry and intact. No rash noted. Psychiatric: Mood and affect are  normal. Speech and behavior are normal.  ____________________________________________   LABS (all labs ordered are listed, but only abnormal  results are displayed)  Labs Reviewed  CBC WITH DIFFERENTIAL/PLATELET - Abnormal; Notable for the following components:      Result Value   RBC 3.59 (*)    Hemoglobin 9.7 (*)    HCT 31.8 (*)    RDW 19.9 (*)    All other components within normal limits  BASIC METABOLIC PANEL - Abnormal; Notable for the following components:   CO2 33 (*)    Glucose, Bld 104 (*)    BUN 28 (*)    Creatinine, Ser 1.71 (*)    GFR calc non Af Amer 28 (*)    GFR calc Af Amer 32 (*)    All other components within normal limits  TROPONIN I   ____________________________________________  EKG  ED ECG REPORT I, Doran Stabler, the attending physician, personally viewed and interpreted this ECG.   Date: 12/24/2017  EKG Time: 1847  Rate: 90  Rhythm: normal sinus rhythm  Axis: Normal  Intervals:none  ST&T Change: No ST segment elevation or depression.  No abnormal T wave inversion.  ____________________________________________  RADIOLOGY  Chest x-ray without acute process. ____________________________________________   PROCEDURES  Procedure(s) performed:   Procedures  Critical Care performed:   ____________________________________________   INITIAL IMPRESSION / ASSESSMENT AND PLAN / ED COURSE  Pertinent labs & imaging results that were available during my care of the patient were reviewed by me and considered in my medical decision making (see chart for details).  DDX: Positive d-dimer, PE, CHF exacerbation, COPD exacerbation, epistaxis, hemoptysis, pneumonia As part of my medical decision making, I reviewed the following data within the [place] Notes from prior ED visits  Patient with blood found in the right nare.  Likely nosebleed dripping into the pharynx causing symptoms.  Elevated d-dimer likely elevated secondary  to other cause.  Patient chronically ill.  No clinical signs or symptoms at this time of PE.  ----------------------------------------- 8:00 PM on 12/24/2017 -----------------------------------------  Patient says that she is having increased shortness of breath and gets as needed neb at her skilled nursing facility when this happens and is requesting a neb at this time.  Otherwise, denying any chest pain.  No further hemoptysis or epistaxis.  Vital signs stable and patient's nasal cannula 2 L.  Will give nebulizer treatment as well as Tylenol the patient is requesting for headache which he says also does well for her headaches.  We will plan on discharging the patient thereafter.  Patient as well as husband understands diagnosis as well as treatment plan willing to comply.  Epistaxis much more likely cause than PE secondary to patient's labs and imaging otherwise as well as clinical history being reassuring.  Nares appeared dry and cracked bilaterally.  Patient says that SNF already place cream to the nares bilaterally.  ____________________________________________   FINAL CLINICAL IMPRESSION(S) / ED DIAGNOSES  Epistaxis.  NEW MEDICATIONS STARTED DURING THIS VISIT:  New Prescriptions   No medications on file     Note:  This document was prepared using Dragon voice recognition software and may include unintentional dictation errors.     Orbie Pyo, MD 12/24/17 2002    Orbie Pyo, MD 12/24/17 2003  After nebs the patient says that she feels back to baseline.  Lungs clear to auscultation throughout.    Orbie Pyo, MD 12/24/17 2127

## 2017-12-24 NOTE — ED Triage Notes (Signed)
Pt brought in by [facility] from [facility] to be worked up for pulmonary embolism per staff at facility. Pt states she has been coughing since this morning and has "coughed up blood" once this morning. States she also had blood from right side of nose this morning. Pt states her chest was hurting this morning but she is pain free at this time. She wears 2 lpm via  at all times. Staff advised pt's D-dimer was "elevated." MD at bedside to assess when pt arrived.

## 2017-12-25 ENCOUNTER — Inpatient Hospital Stay: Payer: Medicare HMO

## 2017-12-25 ENCOUNTER — Other Ambulatory Visit: Payer: Self-pay

## 2017-12-25 ENCOUNTER — Encounter: Payer: Self-pay | Admitting: Radiology

## 2017-12-25 ENCOUNTER — Observation Stay
Admission: AD | Admit: 2017-12-25 | Discharge: 2017-12-26 | Disposition: A | Discharge status: Skilled Nursing Facility | Payer: Medicare HMO | Source: Ambulatory Visit | Attending: Internal Medicine | Admitting: Internal Medicine

## 2017-12-25 DIAGNOSIS — R0602 Shortness of breath: Secondary | ICD-10-CM | POA: Diagnosis not present

## 2017-12-25 DIAGNOSIS — J9621 Acute and chronic respiratory failure with hypoxia: Secondary | ICD-10-CM | POA: Diagnosis not present

## 2017-12-25 DIAGNOSIS — K219 Gastro-esophageal reflux disease without esophagitis: Secondary | ICD-10-CM | POA: Diagnosis not present

## 2017-12-25 DIAGNOSIS — I130 Hypertensive heart and chronic kidney disease with heart failure and stage 1 through stage 4 chronic kidney disease, or unspecified chronic kidney disease: Secondary | ICD-10-CM | POA: Insufficient documentation

## 2017-12-25 DIAGNOSIS — Z7902 Long term (current) use of antithrombotics/antiplatelets: Secondary | ICD-10-CM | POA: Diagnosis not present

## 2017-12-25 DIAGNOSIS — F329 Major depressive disorder, single episode, unspecified: Secondary | ICD-10-CM | POA: Insufficient documentation

## 2017-12-25 DIAGNOSIS — N183 Chronic kidney disease, stage 3 (moderate): Secondary | ICD-10-CM | POA: Insufficient documentation

## 2017-12-25 DIAGNOSIS — Z794 Long term (current) use of insulin: Secondary | ICD-10-CM | POA: Insufficient documentation

## 2017-12-25 DIAGNOSIS — Z79899 Other long term (current) drug therapy: Secondary | ICD-10-CM | POA: Insufficient documentation

## 2017-12-25 DIAGNOSIS — Z87891 Personal history of nicotine dependence: Secondary | ICD-10-CM | POA: Insufficient documentation

## 2017-12-25 DIAGNOSIS — K5909 Other constipation: Secondary | ICD-10-CM | POA: Diagnosis not present

## 2017-12-25 DIAGNOSIS — I82413 Acute embolism and thrombosis of femoral vein, bilateral: Secondary | ICD-10-CM | POA: Insufficient documentation

## 2017-12-25 DIAGNOSIS — E1122 Type 2 diabetes mellitus with diabetic chronic kidney disease: Secondary | ICD-10-CM | POA: Insufficient documentation

## 2017-12-25 DIAGNOSIS — J441 Chronic obstructive pulmonary disease with (acute) exacerbation: Secondary | ICD-10-CM | POA: Diagnosis not present

## 2017-12-25 DIAGNOSIS — R0600 Dyspnea, unspecified: Secondary | ICD-10-CM | POA: Diagnosis present

## 2017-12-25 DIAGNOSIS — F419 Anxiety disorder, unspecified: Secondary | ICD-10-CM | POA: Insufficient documentation

## 2017-12-25 DIAGNOSIS — R569 Unspecified convulsions: Secondary | ICD-10-CM | POA: Insufficient documentation

## 2017-12-25 DIAGNOSIS — R7989 Other specified abnormal findings of blood chemistry: Secondary | ICD-10-CM

## 2017-12-25 DIAGNOSIS — I509 Heart failure, unspecified: Secondary | ICD-10-CM | POA: Diagnosis not present

## 2017-12-25 DIAGNOSIS — R1310 Dysphagia, unspecified: Secondary | ICD-10-CM

## 2017-12-25 DIAGNOSIS — Z8673 Personal history of transient ischemic attack (TIA), and cerebral infarction without residual deficits: Secondary | ICD-10-CM | POA: Insufficient documentation

## 2017-12-25 DIAGNOSIS — R079 Chest pain, unspecified: Secondary | ICD-10-CM | POA: Diagnosis not present

## 2017-12-25 DIAGNOSIS — E559 Vitamin D deficiency, unspecified: Secondary | ICD-10-CM | POA: Diagnosis not present

## 2017-12-25 DIAGNOSIS — R040 Epistaxis: Secondary | ICD-10-CM | POA: Insufficient documentation

## 2017-12-25 DIAGNOSIS — R042 Hemoptysis: Secondary | ICD-10-CM | POA: Diagnosis not present

## 2017-12-25 DIAGNOSIS — E785 Hyperlipidemia, unspecified: Secondary | ICD-10-CM | POA: Diagnosis not present

## 2017-12-25 DIAGNOSIS — K920 Hematemesis: Secondary | ICD-10-CM | POA: Diagnosis not present

## 2017-12-25 HISTORY — DX: Type 2 diabetes mellitus without complications: E11.9

## 2017-12-25 LAB — BLOOD GAS, ARTERIAL
ACID-BASE EXCESS: 8.2 mmol/L — AB (ref 0.0–2.0)
BICARBONATE: 32.8 mmol/L — AB (ref 20.0–28.0)
FIO2: 0.28
O2 Saturation: 96.2 %
PH ART: 7.47 — AB (ref 7.350–7.450)
PO2 ART: 78 mmHg — AB (ref 83.0–108.0)
Patient temperature: 37
pCO2 arterial: 45 mmHg (ref 32.0–48.0)

## 2017-12-25 LAB — CBC WITH DIFFERENTIAL/PLATELET
Abs Immature Granulocytes: 0.05 10*3/uL (ref 0.00–0.07)
BASOS PCT: 0 %
Basophils Absolute: 0 10*3/uL (ref 0.0–0.1)
EOS PCT: 2 %
Eosinophils Absolute: 0.1 10*3/uL (ref 0.0–0.5)
HEMATOCRIT: 32.5 % — AB (ref 36.0–46.0)
HEMOGLOBIN: 10.1 g/dL — AB (ref 12.0–15.0)
Immature Granulocytes: 1 %
Lymphocytes Relative: 23 %
Lymphs Abs: 1.2 10*3/uL (ref 0.7–4.0)
MCH: 27.5 pg (ref 26.0–34.0)
MCHC: 31.1 g/dL (ref 30.0–36.0)
MCV: 88.6 fL (ref 80.0–100.0)
MONO ABS: 0.6 10*3/uL (ref 0.1–1.0)
Monocytes Relative: 12 %
NRBC: 0 % (ref 0.0–0.2)
Neutro Abs: 3.2 10*3/uL (ref 1.7–7.7)
Neutrophils Relative %: 62 %
PLATELETS: 272 10*3/uL (ref 150–400)
RBC: 3.67 MIL/uL — AB (ref 3.87–5.11)
RDW: 19.8 % — ABNORMAL HIGH (ref 11.5–15.5)
WBC: 5.2 10*3/uL (ref 4.0–10.5)

## 2017-12-25 LAB — COMPREHENSIVE METABOLIC PANEL
ALK PHOS: 80 U/L (ref 38–126)
ALT: 13 U/L (ref 0–44)
ANION GAP: 14 (ref 5–15)
AST: 19 U/L (ref 15–41)
Albumin: 3 g/dL — ABNORMAL LOW (ref 3.5–5.0)
BUN: 26 mg/dL — ABNORMAL HIGH (ref 8–23)
CALCIUM: 9.7 mg/dL (ref 8.9–10.3)
CO2: 28 mmol/L (ref 22–32)
CREATININE: 1.79 mg/dL — AB (ref 0.44–1.00)
Chloride: 100 mmol/L (ref 98–111)
GFR calc non Af Amer: 26 mL/min — ABNORMAL LOW (ref >60)
GFR, EST AFRICAN AMERICAN: 31 mL/min — AB (ref >60)
Glucose, Bld: 100 mg/dL — ABNORMAL HIGH (ref 70–99)
Potassium: 3.7 mmol/L (ref 3.5–5.1)
Sodium: 142 mmol/L (ref 135–145)
Total Bilirubin: 0.8 mg/dL (ref 0.3–1.2)
Total Protein: 6.6 g/dL (ref 6.5–8.1)

## 2017-12-25 LAB — MAGNESIUM: MAGNESIUM: 1.3 mg/dL — AB (ref 1.7–2.4)

## 2017-12-25 LAB — PROTIME-INR
INR: 0.95
PROTHROMBIN TIME: 12.6 s (ref 11.4–15.2)

## 2017-12-25 LAB — MRSA PCR SCREENING: MRSA by PCR: NEGATIVE

## 2017-12-25 LAB — TROPONIN I: Troponin I: <0.03 ng/mL (ref <0.03)

## 2017-12-25 LAB — HEMOGLOBIN A1C
HEMOGLOBIN A1C: 6.9 % — AB (ref 4.8–5.6)
Mean Plasma Glucose: 151.33 mg/dL

## 2017-12-25 LAB — TSH: TSH: 1.637 u[IU]/mL (ref 0.350–4.500)

## 2017-12-25 MED ORDER — ZINC SULFATE 220 (50 ZN) MG PO CAPS
220.0000 mg | ORAL_CAPSULE | Freq: Every day | ORAL | Status: DC
Start: 2017-12-25 — End: 2017-12-26
  Administered 2017-12-25 – 2017-12-26 (×2): 220 mg via ORAL
  Filled 2017-12-25 (×2): qty 1

## 2017-12-25 MED ORDER — FLUOXETINE HCL 10 MG PO CAPS
10.0000 mg | ORAL_CAPSULE | Freq: Every day | ORAL | Status: DC
  Administered 2017-12-25 – 2017-12-26 (×2): 10 mg via ORAL
  Filled 2017-12-25 (×2): qty 1

## 2017-12-25 MED ORDER — FUROSEMIDE 20 MG PO TABS
20.0000 mg | ORAL_TABLET | Freq: Every day | ORAL | Status: DC
  Administered 2017-12-26: 20 mg via ORAL
  Filled 2017-12-25 (×2): qty 1

## 2017-12-25 MED ORDER — GABAPENTIN 300 MG PO CAPS
300.0000 mg | ORAL_CAPSULE | Freq: Every day | ORAL | Status: DC
  Administered 2017-12-25: 300 mg via ORAL
  Filled 2017-12-25: qty 1

## 2017-12-25 MED ORDER — HYDROCODONE-ACETAMINOPHEN 5-325 MG PO TABS: 1.0000 | ORAL_TABLET | ORAL | Status: DC | PRN

## 2017-12-25 MED ORDER — POLYETHYLENE GLYCOL 3350 17 G PO PACK: 17.0000 g | PACK | Freq: Every day | ORAL | Status: DC | PRN

## 2017-12-25 MED ORDER — SODIUM CHLORIDE 0.9% FLUSH
3.0000 mL | Freq: Two times a day (BID) | INTRAVENOUS | Status: DC
  Administered 2017-12-25: 3 mL via INTRAVENOUS

## 2017-12-25 MED ORDER — ACETAMINOPHEN 325 MG PO TABS: 650.0000 mg | ORAL_TABLET | Freq: Four times a day (QID) | ORAL | Status: DC | PRN

## 2017-12-25 MED ORDER — FLUTICASONE FUROATE-VILANTEROL 100-25 MCG/INH IN AEPB
1.0000 | INHALATION_SPRAY | Freq: Every day | RESPIRATORY_TRACT | Status: DC
  Administered 2017-12-25 – 2017-12-26 (×2): 1 via RESPIRATORY_TRACT
  Filled 2017-12-25: qty 28

## 2017-12-25 MED ORDER — VITAMIN D 25 MCG (1000 UNIT) PO TABS
1000.0000 [IU] | ORAL_TABLET | Freq: Every day | ORAL | Status: DC
  Administered 2017-12-26: 10:00:00 1000 [IU] via ORAL

## 2017-12-25 MED ORDER — ORAL CARE MOUTH RINSE: 15.0000 mL | Freq: Two times a day (BID) | OROMUCOSAL | Status: DC

## 2017-12-25 MED ORDER — ONDANSETRON HCL 4 MG PO TABS: 4.0000 mg | ORAL_TABLET | Freq: Four times a day (QID) | ORAL | Status: DC | PRN

## 2017-12-25 MED ORDER — NITROGLYCERIN 0.4 MG SL SUBL: 0.4000 mg | SUBLINGUAL_TABLET | SUBLINGUAL | Status: DC | PRN

## 2017-12-25 MED ORDER — MORPHINE SULFATE (PF) 2 MG/ML IV SOLN: 2.0000 mg | INTRAVENOUS | Status: DC | PRN

## 2017-12-25 MED ORDER — VITAMIN C 500 MG PO TABS
500.0000 mg | ORAL_TABLET | Freq: Two times a day (BID) | ORAL | Status: DC
  Administered 2017-12-25 – 2017-12-26 (×2): 500 mg via ORAL
  Filled 2017-12-25 (×2): qty 1

## 2017-12-25 MED ORDER — ONDANSETRON HCL 4 MG/2ML IJ SOLN: 4.0000 mg | Freq: Four times a day (QID) | INTRAMUSCULAR | Status: DC | PRN

## 2017-12-25 MED ORDER — FLUTICASONE-UMECLIDIN-VILANT 100-62.5-25 MCG/INH IN AEPB: 1.0000 | INHALATION_SPRAY | Freq: Every day | RESPIRATORY_TRACT | Status: DC

## 2017-12-25 MED ORDER — IOHEXOL 350 MG/ML SOLN
75.0000 mL | Freq: Once | INTRAVENOUS | Status: AC | PRN
  Administered 2017-12-25: 60 mL via INTRAVENOUS

## 2017-12-25 MED ORDER — CHLORHEXIDINE GLUCONATE 0.12 % MT SOLN
15.0000 mL | Freq: Two times a day (BID) | OROMUCOSAL | Status: DC
  Administered 2017-12-25 – 2017-12-26 (×2): 15 mL via OROMUCOSAL
  Filled 2017-12-25 (×2): qty 15

## 2017-12-25 MED ORDER — LORAZEPAM 2 MG/ML IJ SOLN: 1.0000 mg | INTRAMUSCULAR | Status: DC | PRN

## 2017-12-25 MED ORDER — UMECLIDINIUM BROMIDE 62.5 MCG/INH IN AEPB
1.0000 | INHALATION_SPRAY | Freq: Every day | RESPIRATORY_TRACT | Status: DC
  Filled 2017-12-25: qty 7

## 2017-12-25 MED ORDER — HYDRALAZINE HCL 20 MG/ML IJ SOLN: 10.0000 mg | INTRAMUSCULAR | Status: DC | PRN

## 2017-12-25 MED ORDER — ACETAMINOPHEN 650 MG RE SUPP: 650.0000 mg | Freq: Four times a day (QID) | RECTAL | Status: DC | PRN

## 2017-12-25 MED ORDER — GUAIFENESIN ER 600 MG PO TB12
600.0000 mg | ORAL_TABLET | Freq: Two times a day (BID) | ORAL | Status: DC
  Administered 2017-12-25 – 2017-12-26 (×2): 600 mg via ORAL
  Filled 2017-12-25 (×2): qty 1

## 2017-12-25 MED ORDER — ADULT MULTIVITAMIN W/MINERALS CH
1.0000 | ORAL_TABLET | Freq: Every day | ORAL | Status: DC
  Administered 2017-12-26: 10:00:00 1 via ORAL
  Filled 2017-12-25 (×2): qty 1

## 2017-12-25 MED ORDER — LINAGLIPTIN 5 MG PO TABS
5.0000 mg | ORAL_TABLET | Freq: Every day | ORAL | Status: DC
  Administered 2017-12-25 – 2017-12-26 (×2): 5 mg via ORAL
  Filled 2017-12-25 (×2): qty 1

## 2017-12-25 MED ORDER — ACETAMINOPHEN 325 MG PO TABS
650.0000 mg | ORAL_TABLET | Freq: Three times a day (TID) | ORAL | Status: DC
  Administered 2017-12-25 – 2017-12-26 (×2): 650 mg via ORAL
  Filled 2017-12-25 (×2): qty 2

## 2017-12-25 MED ORDER — ALBUTEROL SULFATE (2.5 MG/3ML) 0.083% IN NEBU
3.0000 mL | INHALATION_SOLUTION | Freq: Four times a day (QID) | RESPIRATORY_TRACT | Status: DC | PRN
  Administered 2017-12-26: 3 mL via RESPIRATORY_TRACT
  Filled 2017-12-25: qty 3

## 2017-12-25 MED ORDER — HYDRALAZINE HCL 50 MG PO TABS
25.0000 mg | ORAL_TABLET | Freq: Three times a day (TID) | ORAL | Status: DC
  Administered 2017-12-25: 23:00:00 25 mg via ORAL
  Filled 2017-12-25 (×2): qty 1

## 2017-12-25 MED ORDER — LORATADINE 10 MG PO TABS
10.0000 mg | ORAL_TABLET | Freq: Every day | ORAL | Status: DC
  Administered 2017-12-26: 10 mg via ORAL
  Filled 2017-12-25 (×2): qty 1

## 2017-12-25 MED ORDER — IOHEXOL 350 MG/ML SOLN
50.0000 mL | Freq: Once | INTRAVENOUS | Status: AC | PRN
  Administered 2017-12-25: 50 mL via INTRAVENOUS

## 2017-12-25 MED ORDER — MECLIZINE HCL 25 MG PO TABS
25.0000 mg | ORAL_TABLET | Freq: Two times a day (BID) | ORAL | Status: DC | PRN
  Filled 2017-12-25: qty 1

## 2017-12-25 MED ORDER — SODIUM CHLORIDE 0.9 % IV SOLN
250.0000 mL | INTRAVENOUS | Status: DC | PRN
  Administered 2017-12-25: 250 mL via INTRAVENOUS

## 2017-12-25 MED ORDER — ATORVASTATIN CALCIUM 20 MG PO TABS
80.0000 mg | ORAL_TABLET | Freq: Every evening | ORAL | Status: DC
  Administered 2017-12-25: 19:00:00 80 mg via ORAL
  Filled 2017-12-25: qty 4

## 2017-12-25 MED ORDER — DOCUSATE SODIUM 100 MG PO CAPS
100.0000 mg | ORAL_CAPSULE | Freq: Every day | ORAL | Status: DC
  Administered 2017-12-26: 100 mg via ORAL
  Filled 2017-12-25: qty 1

## 2017-12-25 MED ORDER — IPRATROPIUM-ALBUTEROL 0.5-2.5 (3) MG/3ML IN SOLN
3.0000 mL | Freq: Two times a day (BID) | RESPIRATORY_TRACT | Status: DC
  Administered 2017-12-25 – 2017-12-26 (×2): 3 mL via RESPIRATORY_TRACT
  Filled 2017-12-25: qty 3

## 2017-12-25 MED ORDER — IPRATROPIUM BROMIDE 0.03 % NA SOLN
2.0000 | Freq: Two times a day (BID) | NASAL | Status: DC
  Administered 2017-12-25 – 2017-12-26 (×2): 2 via NASAL
  Filled 2017-12-25: qty 30

## 2017-12-25 MED ORDER — DICLOFENAC SODIUM 1 % TD GEL
2.0000 g | TRANSDERMAL | Status: DC | PRN
  Filled 2017-12-25: qty 100

## 2017-12-25 MED ORDER — SODIUM CHLORIDE 0.9% FLUSH: 3.0000 mL | INTRAVENOUS | Status: DC | PRN

## 2017-12-25 MED ORDER — LEVETIRACETAM ER 500 MG PO TB24
500.0000 mg | ORAL_TABLET | Freq: Every day | ORAL | Status: DC
  Administered 2017-12-25: 500 mg via ORAL
  Filled 2017-12-25 (×3): qty 1

## 2017-12-25 MED ORDER — PANTOPRAZOLE SODIUM 40 MG PO TBEC
40.0000 mg | DELAYED_RELEASE_TABLET | Freq: Every day | ORAL | Status: DC
  Administered 2017-12-26: 40 mg via ORAL
  Filled 2017-12-25 (×2): qty 1

## 2017-12-25 MED ORDER — DILTIAZEM HCL ER 60 MG PO CP12
120.0000 mg | ORAL_CAPSULE | Freq: Two times a day (BID) | ORAL | Status: DC
  Administered 2017-12-25: 120 mg via ORAL
  Filled 2017-12-25 (×3): qty 2

## 2017-12-25 NOTE — H&P (Signed)
[facility] [place] NAME: Katherine [name]    MR#:  [id number]  DATE OF BIRTH:  1940/09/04  DATE OF ADMISSION:  12/25/2017  PRIMARY CARE PHYSICIAN: Valerie Roys, DO   REQUESTING/REFERRING PHYSICIAN:   CHIEF COMPLAINT:  No chief complaint on file.   HISTORY OF PRESENT ILLNESS: Katherine [name]  is a 77 y.o. female with a known history per below, frequent hospitalizations for similar presentation for COPD exacerbation-discharged on December 05, 2017 to extended care facility, presenting as a direct admission by primary care provider for epistaxis, hemoptysis, worsening shortness of breath, concern for acute pulmonary embolism, patient evaluated at the bedside on the medical floor, patient resting comfortably in bed, no apparent distress, patient denies any current nosebleeding, denies any coughing up of blood, does complain of some shortness of breath, admission blood work currently in process, CT chest to evaluate for pulmonary embolism is pending, patient is now been admitted for acute epistaxis, hemoptysis, acute on chronic hypoxic respiratory failure.    PAST MEDICAL HISTORY:   Past Medical History:  Diagnosis Date  . Angioedema   . Anxiety   . Anxiety and depression   . CHF (congestive heart failure) ([place])   . Chronic constipation   . Chronic kidney disease    stage 3  . COPD (chronic obstructive pulmonary disease) ([place])   . Gallstones   . GERD (gastroesophageal reflux disease)   . Hypertension   . Left ventricular hypertrophy   . Osteoarthritis   . Prurigo nodularis   . Seizures ([place])   . Stroke ([place])   . Tobacco abuse   . Vitamin D deficiency disease     PAST SURGICAL HISTORY:  Past Surgical History:  Procedure Laterality Date  . CHOLECYSTECTOMY    . 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   Procedure: TOTAL KNEE ARTHROPLASTY;  Surgeon: Hessie Knows, MD;  Location: [facility] ORS;  Service:  Orthopedics;  Laterality: Right;    SOCIAL HISTORY:  Social History   Tobacco Use  . Smoking status: Former Smoker    Packs/day: 0.50    Years: 60.00    Pack years: 30.00    Types: Cigarettes  . Smokeless tobacco: Never Used  Substance Use Topics  . Alcohol use: No    Alcohol/week: 0.0 standard drinks    Comment: rare    FAMILY HISTORY:  Family History  Problem Relation Age of Onset  . Alcohol abuse Mother   . Sickle cell anemia Daughter   . Hypertension Son   . Cancer Neg Hx   . COPD Neg Hx   . Diabetes Neg Hx   . Heart disease Neg Hx   . Stroke Neg Hx     DRUG ALLERGIES:  Allergies  Allergen Reactions  . Bee Venom Swelling  . Enalapril Maleate Swelling    REVIEW OF SYSTEMS:   CONSTITUTIONAL: No fever, + fatigue, weakness EYES: No blurred or double vision.  EARS, NOSE, AND THROAT: No tinnitus or ear pain.  RESPIRATORY: + cough, shortness of breath, wheezing, hemoptysis.  CARDIOVASCULAR: No chest pain, orthopnea, edema.  GASTROINTESTINAL: No nausea, vomiting, diarrhea or abdominal pain.  GENITOURINARY: No dysuria, hematuria.  ENDOCRINE: No polyuria, nocturia,  HEMATOLOGY: No anemia, easy bruising or bleeding SKIN: No rash or lesion. MUSCULOSKELETAL: No joint pain or arthritis.   NEUROLOGIC: No tingling, numbness, weakness.  PSYCHIATRY: No anxiety or depression.   MEDICATIONS AT HOME:  Prior to Admission  medications   Medication Sig Start Date End Date Taking? Authorizing Provider  acetaminophen (TYLENOL) 650 MG CR tablet Take 650 mg by mouth 3 (three) times daily.     [provider]  acetaminophen (TYLENOL) 650 MG CR tablet Take 650 mg by mouth every 8 (eight) hours as needed for pain.    [provider]  albuterol (ACCUNEB) 0.63 MG/3ML nebulizer solution Take 3 mLs by nebulization every 8 (eight) hours as needed for wheezing.     [provider]  albuterol (PROVENTIL HFA;VENTOLIN HFA) 108 (90 Base) MCG/ACT inhaler Inhale 2  puffs into the lungs every 4 (four) hours as needed for wheezing or shortness of breath.    [provider]  atorvastatin (LIPITOR) 80 MG tablet Take 80 mg by mouth every evening.     [provider]  cetirizine (ZYRTEC) 10 MG tablet Take 5 mg by mouth at bedtime.    [provider]  cholecalciferol (VITAMIN D) 1000 units tablet Take 1,000 Units by mouth daily.    [provider]  clopidogrel (PLAVIX) 75 MG tablet Take 75 mg by mouth daily.    [provider]  diclofenac sodium (VOLTAREN) 1 % GEL Apply 2 g topically as needed (for left knee pain).     [provider]  diltiazem (CARDIZEM SR) 120 MG 12 hr capsule Take 120 mg by mouth 2 (two) times daily.    [provider]  docusate sodium (COLACE) 100 MG capsule Take 100 mg by mouth daily.    [provider]  FLUoxetine (PROZAC) 10 MG capsule Take 10 mg by mouth daily.    [provider]  Fluticasone-Umeclidin-Vilant 100-62.5-25 MCG/INH AEPB Inhale 1 puff into the lungs daily.    [provider]  furosemide (LASIX) 20 MG tablet Take 1 tablet (20 mg total) by mouth daily. 11/14/17   Shanita Kanan, Avel Peace, MD  gabapentin (NEURONTIN) 300 MG capsule Take 300 mg by mouth at bedtime.    [provider]  guaiFENesin (MUCINEX) 600 MG 12 hr tablet Take 1 tablet (600 mg total) by mouth 2 (two) times daily. 11/13/17   Jesusita Jocelyn, Avel Peace, MD  hydrALAZINE (APRESOLINE) 25 MG tablet Take 25 mg by mouth 3 (three) times daily.    [provider]  ipratropium (ATROVENT) 0.03 % nasal spray Place 2 sprays into both nostrils 2 (two) times daily.     [provider]  ipratropium-albuterol (DUONEB) 0.5-2.5 (3) MG/3ML SOLN Take 3 mLs by nebulization 2 (two) times daily.     [provider]  levETIRAcetam (KEPPRA XR) 500 MG 24 hr tablet Take 1 tablet (500 mg total) by mouth daily. 11/19/17   Venancio Poisson, NP  linagliptin (TRADJENTA) 5 MG TABS tablet  Take 5 mg by mouth daily.    [provider]  LORazepam (ATIVAN) 2 MG/ML injection Inject 1 mg into the muscle as needed for seizure. 12/18/17   [provider]  meclizine (ANTIVERT) 25 MG tablet Take 25 mg by mouth every 12 (twelve) hours as needed for dizziness.     [provider]  Multiple Vitamin (MULTIVITAMIN WITH MINERALS) TABS tablet Take 1 tablet by mouth daily.    [provider]  pantoprazole (PROTONIX) 40 MG tablet Take 40 mg by mouth daily.    [provider]  vitamin C (ASCORBIC ACID) 500 MG tablet Take 500 mg by mouth 2 (two) times daily.    [provider]  zinc sulfate 220 (50 Zn) MG capsule Take  220 mg by mouth daily.    [provider]      PHYSICAL EXAMINATION:   VITAL SIGNS: VVS  GENERAL:  77 y.o.-year-old patient lying in the bed with no acute distress. obese EYES: Pupils equal, round, reactive to light and accommodation. No scleral icterus. Extraocular muscles intact.  HEENT: Head atraumatic, normocephalic. Oropharynx and nasopharynx clear.  NECK:  Supple, no jugular venous distention. No thyroid enlargement, no tenderness.  LUNGS: Diminished breath sounds throughout with rhonchi. No use of accessory muscles of respiration.  CARDIOVASCULAR: S1, S2 normal. No murmurs, rubs, or gallops.  ABDOMEN: Soft, nontender, nondistended. Bowel sounds present. No organomegaly or mass.  EXTREMITIES: No pedal edema, cyanosis, or clubbing.  NEUROLOGIC: Cranial nerves II through XII are intact. Muscle strength 5/5 in all extremities. Sensation intact. Gait not checked.  PSYCHIATRIC: The patient is alert and oriented x 3.  SKIN: No obvious rash, lesion, or ulcer.   LABORATORY PANEL:   CBC Recent Labs  Lab 12/18/17 1953 12/24/17 1850  WBC 5.9 5.2  HGB 9.6* 9.7*  HCT 31.0* 31.8*  PLT 207 267  MCV 89.1 88.6  MCH 27.6 27.0  MCHC 31.0 30.5  RDW 20.2* 19.9*  LYMPHSABS 1.3 1.3  MONOABS 0.5 0.5  EOSABS 0.2 0.1   BASOSABS 0.0 0.0   ------------------------------------------------------------------------------------------------------------------  Chemistries  Recent Labs  Lab 12/18/17 1953 12/24/17 1850  NA 143 143  K 4.0 3.8  CL 107 100  CO2 28 33*  GLUCOSE 114* 104*  BUN 23 28*  CREATININE 1.74* 1.71*  CALCIUM 9.3 9.6   ------------------------------------------------------------------------------------------------------------------ estimated creatinine clearance is 37.7 mL/min (A) (by C-G formula based on SCr of 1.71 mg/dL (H)). ------------------------------------------------------------------------------------------------------------------ No results for input(s): TSH, T4TOTAL, T3FREE, THYROIDAB in the last 72 hours.  Invalid input(s): FREET3   Coagulation profile No results for input(s): INR, PROTIME in the last 168 hours. ------------------------------------------------------------------------------------------------------------------- No results for input(s): DDIMER in the last 72 hours. -------------------------------------------------------------------------------------------------------------------  Cardiac Enzymes Recent Labs  Lab 12/24/17 1850  TROPONINI <0.03   ------------------------------------------------------------------------------------------------------------------ Invalid input(s): POCBNP  ---------------------------------------------------------------------------------------------------------------  Urinalysis    Component Value Date/Time   COLORURINE YELLOW (A) 12/18/2017 1953   APPEARANCEUR HAZY (A) 12/18/2017 1953   APPEARANCEUR Clear 02/20/2013 1459   LABSPEC 1.009 12/18/2017 1953   LABSPEC 1.012 02/20/2013 1459   PHURINE 5.0 12/18/2017 1953   GLUCOSEU NEGATIVE 12/18/2017 1953   GLUCOSEU Negative 02/20/2013 1459   HGBUR NEGATIVE 12/18/2017 1953   BILIRUBINUR NEGATIVE 12/18/2017 1953   BILIRUBINUR Negative 02/20/2013 1459   KETONESUR  NEGATIVE 12/18/2017 1953   PROTEINUR NEGATIVE 12/18/2017 1953   NITRITE NEGATIVE 12/18/2017 1953   LEUKOCYTESUR LARGE (A) 12/18/2017 1953   LEUKOCYTESUR Negative 02/20/2013 1459     RADIOLOGY: Dg Chest 1 View  Result Date: 12/24/2017 CLINICAL DATA:  Hemoptysis EXAM: CHEST  1 VIEW COMPARISON:  12/01/2017, 11/29/2017 FINDINGS: Mild cardiomegaly with aortic atherosclerosis. Linear scarring within the bilateral lung bases. No acute consolidation or effusion. No pneumothorax. IMPRESSION: No active disease.  Cardiomegaly. Electronically Signed   By: Donavan Foil M.D.   On: 12/24/2017 19:22    EKG: Orders placed or performed during the hospital encounter of 12/24/17  . ED EKG  . ED EKG  . EKG 12-Lead  . EKG 12-Lead  . EKG 12-Lead  . EKG 12-Lead  . EKG    IMPRESSION AND PLAN: 77 year old female with past medical history of COPD, obesity, previous history of CVA, anxiety/depression, GERD, hypertension, history of seizures who presents to the hospital as a  direct admission for hemoptysis/epistaxis/chest pain elevated d-dimer/outpatient provider concern for PE   *Acute on chronic toxic respiratory failure  Compounded by epistaxis, hemoptysis, elevated d-dimer noted on outpatient testing, concern for possible acute pulmonary embolism  Admit to regular nursing for bed, check admission blood work, proceed with CT of the chest for further evaluation if creatinine is okay, hold antiplatelet/anticoagulants/NSAID medications given acute bleeding, supplemental oxygen PRN-on chronic 2 L at home continuous, and continue close medical monitoring  *COPD w/o ex Stable Continue with duo nebs, inhaled corticosteroids, supplemental oxygen-on 2 L chronically at home   *History of seizures Stable Continue Keppra, seizure precautions while in house   *Chronic GERD Stable Continue Protonix  *Chronic DM type II  Continue Tradjenta, sliding scale insulin with Accu-Cheks per routine  *Chronic  benign essential hypertension  Stable Continue Cardizem  *History of previous CVA Hold antiplatelet agents given complaints of hemoptysis and epistaxis for now   *History of depression Stable continue Prozac  Chronic hyperlipidemia, unspecified  Stable  Continue Atorvastatin   All the records are reviewed and case discussed with ED provider. Management plans discussed with the patient, family and they are in agreement.  CODE STATUS:full Code Status History    Date Active Date Inactive Code Status Order ID Comments User Context   11/29/2017 2119 12/05/2017 1841 Full Code [id number]  Gorden Harms, MD Inpatient   11/22/2017 0408 11/26/2017 1804 Full Code [id number]  Arta Silence, MD Inpatient   11/10/2017 1840 11/13/2017 1653 Full Code [id number]  Sela Hua, MD Inpatient   10/24/2017 0247 10/29/2017 2301 Full Code [id number]  Lance Coon, MD Inpatient   10/04/2017 1005 10/10/2017 1813 Full Code [id number]  Marliss Coots, PA-C Inpatient   10/01/2017 0819 10/04/2017 0904 Full Code [id number]  Lolita Cram, RN Inpatient   09/19/2017 1306 09/22/2017 1622 Full Code [id number]  Gorden Harms, MD Inpatient   09/11/2017 2021 09/15/2017 1510 Full Code [id number]  Gorden Harms, MD Inpatient   08/27/2017 1543 08/29/2017 2159 Full Code [id number]  Hillary Bow, MD ED   06/28/2017 1414 06/30/2017 1748 Full Code [id number]  Gorden Harms, MD Inpatient   06/12/2017 0858 06/16/2017 1659 DNR [id number]  Demetrios Loll, MD Inpatient   05/26/2017 1732 05/30/2017 1446 DNR [id number]  Hillary Bow, MD ED   05/17/2017 1228 05/21/2017 1511 DNR [id number]  Asencion Gowda, NP Inpatient   04/21/2017 0117 04/22/2017 1750 DNR [id number]  Lance Coon, MD Inpatient   04/05/2017 1411 04/08/2017 2101 Full Code [id number]  Saundra Shelling, MD Inpatient   02/08/2016 1414 02/11/2016 1729 Full Code [id number]  Hessie Knows, MD Inpatient   11/27/2015 1645 11/29/2015 1628 Full Code [id number]  Idelle Crouch, MD  Inpatient   02/10/2015 2122 02/12/2015 1809 Full Code [id number]  Lytle Butte, MD ED   09/16/2014 1220 09/19/2014 1736 Full Code [id number]  Aldean Jewett, MD Inpatient       TOTAL TIME TAKING CARE OF THIS PATIENT: 40 minutes.    Avel Peace Caili Escalera M.D on 12/25/2017   Between 7am to 6pm - Pager - [phone]  After 6pm go to [email or web address] - password [email or web address] [facility]  Office  [phone]  CC: Primary care physician; Valerie Roys, DO   Note: This dictation was prepared with Dragon dictation along with smaller phrase technology. Any transcriptional errors that result from this process are unintentional.

## 2017-12-26 ENCOUNTER — Observation Stay: Payer: Medicare HMO

## 2017-12-26 ENCOUNTER — Inpatient Hospital Stay: Payer: Medicare HMO

## 2017-12-26 DIAGNOSIS — R5381 Other malaise: Secondary | ICD-10-CM | POA: Diagnosis not present

## 2017-12-26 DIAGNOSIS — F329 Major depressive disorder, single episode, unspecified: Secondary | ICD-10-CM | POA: Diagnosis not present

## 2017-12-26 DIAGNOSIS — E1122 Type 2 diabetes mellitus with diabetic chronic kidney disease: Secondary | ICD-10-CM | POA: Diagnosis not present

## 2017-12-26 DIAGNOSIS — N183 Chronic kidney disease, stage 3 (moderate): Secondary | ICD-10-CM | POA: Diagnosis not present

## 2017-12-26 DIAGNOSIS — R079 Chest pain, unspecified: Secondary | ICD-10-CM | POA: Diagnosis not present

## 2017-12-26 DIAGNOSIS — I130 Hypertensive heart and chronic kidney disease with heart failure and stage 1 through stage 4 chronic kidney disease, or unspecified chronic kidney disease: Secondary | ICD-10-CM | POA: Diagnosis not present

## 2017-12-26 DIAGNOSIS — R1310 Dysphagia, unspecified: Secondary | ICD-10-CM | POA: Diagnosis not present

## 2017-12-26 DIAGNOSIS — I82413 Acute embolism and thrombosis of femoral vein, bilateral: Secondary | ICD-10-CM | POA: Diagnosis not present

## 2017-12-26 DIAGNOSIS — J441 Chronic obstructive pulmonary disease with (acute) exacerbation: Secondary | ICD-10-CM | POA: Diagnosis not present

## 2017-12-26 DIAGNOSIS — R040 Epistaxis: Secondary | ICD-10-CM | POA: Diagnosis not present

## 2017-12-26 DIAGNOSIS — Z743 Need for continuous supervision: Secondary | ICD-10-CM | POA: Diagnosis not present

## 2017-12-26 DIAGNOSIS — R042 Hemoptysis: Secondary | ICD-10-CM | POA: Diagnosis not present

## 2017-12-26 DIAGNOSIS — I509 Heart failure, unspecified: Secondary | ICD-10-CM | POA: Diagnosis not present

## 2017-12-26 DIAGNOSIS — J9621 Acute and chronic respiratory failure with hypoxia: Secondary | ICD-10-CM | POA: Diagnosis not present

## 2017-12-26 DIAGNOSIS — R7989 Other specified abnormal findings of blood chemistry: Secondary | ICD-10-CM | POA: Diagnosis not present

## 2017-12-26 LAB — CBC
HCT: 30.5 % — ABNORMAL LOW (ref 36.0–46.0)
HEMOGLOBIN: 9.3 g/dL — AB (ref 12.0–15.0)
MCH: 27 pg (ref 26.0–34.0)
MCHC: 30.5 g/dL (ref 30.0–36.0)
MCV: 88.7 fL (ref 80.0–100.0)
NRBC: 0 % (ref 0.0–0.2)
Platelets: 274 10*3/uL (ref 150–400)
RBC: 3.44 MIL/uL — AB (ref 3.87–5.11)
RDW: 19.7 % — ABNORMAL HIGH (ref 11.5–15.5)
WBC: 5 10*3/uL (ref 4.0–10.5)

## 2017-12-26 LAB — TROPONIN I: Troponin I: <0.03 ng/mL (ref <0.03)

## 2017-12-26 MED ORDER — ELIQUIS 5 MG VTE STARTER PACK: ORAL_TABLET | ORAL | 0 refills | Status: DC

## 2017-12-26 MED ORDER — PREDNISONE 10 MG (21) PO TBPK: ORAL_TABLET | ORAL | 0 refills | Status: DC

## 2017-12-26 MED ORDER — APIXABAN 5 MG PO TABS: 5.0000 mg | ORAL_TABLET | Freq: Two times a day (BID) | ORAL | Status: DC

## 2017-12-26 MED ORDER — MAGNESIUM SULFATE 4 GM/100ML IV SOLN
4.0000 g | Freq: Once | INTRAVENOUS | Status: DC
  Filled 2017-12-26: qty 100

## 2017-12-26 MED ORDER — APIXABAN 5 MG PO TABS: 10.0000 mg | ORAL_TABLET | Freq: Two times a day (BID) | ORAL | Status: DC

## 2017-12-26 NOTE — Discharge Summary (Addendum)
[facility] NAME: Katherine [name]    MR#:  [id number]  DATE OF BIRTH:  June 09, 1940  DATE OF ADMISSION:  12/25/2017 ADMITTING PHYSICIAN: Gladstone Lighter, MD  DATE OF DISCHARGE: 12/26/2017  PRIMARY CARE PHYSICIAN: Valerie Roys, DO   ADMISSION DIAGNOSIS:  PE  DISCHARGE DIAGNOSIS:  Active Problems:   Dyspnea   SECONDARY DIAGNOSIS:   Past Medical History:  Diagnosis Date  . Angioedema   . Anxiety   . Anxiety and depression   . CHF (congestive heart failure) ([place])   . Chronic constipation   . Chronic kidney disease    stage 3  . COPD (chronic obstructive pulmonary disease) ([place])   . Diabetes mellitus without complication ([place])   . Gallstones   . GERD (gastroesophageal reflux disease)   . Hypertension   . Left ventricular hypertrophy   . Osteoarthritis   . Prurigo nodularis   . Seizures ([place])   . Stroke ([place])   . Tobacco abuse   . Vitamin D deficiency disease      ADMITTING HISTORY  HISTORY OF PRESENT ILLNESS: Katherine [name]  is a 77 y.o. female with a known history per below, frequent hospitalizations for similar presentation for COPD exacerbation-discharged on December 05, 2017 to extended care facility, presenting as a direct admission by primary care provider for epistaxis, hemoptysis, worsening shortness of breath, concern for acute pulmonary embolism, patient evaluated at the bedside on the medical floor, patient resting comfortably in bed, no apparent distress, patient denies any current nosebleeding, denies any coughing up of blood, does complain of some shortness of breath, admission blood work currently in process, CT chest to evaluate for pulmonary embolism is pending, patient is now been admitted for acute epistaxis, hemoptysis, acute on chronic hypoxic respiratory failure.    HOSPITAL COURSE:   * Acute COPD exacerbation Start prednisone Continue nebs. Inhalers. No hemoptysis or epistaxis in the  hospital. CTA chest showed no PE or infiltrates or edema.  * Bilateral lower extremity DVT Started eliquis. PCP f/u as OP  Other co morbidities remain stable  Stable for discharge back to rehab  CONSULTS OBTAINED:    DRUG ALLERGIES:   Allergies  Allergen Reactions  . Bee Venom Swelling  . Enalapril Maleate Swelling    DISCHARGE MEDICATIONS:   Allergies as of 12/26/2017      Reactions   Bee Venom Swelling   Enalapril Maleate Swelling      Medication List    TAKE these medications   acetaminophen 650 MG CR tablet Commonly known as:  TYLENOL Take 650 mg by mouth 3 (three) times daily.   acetaminophen 650 MG CR tablet Commonly known as:  TYLENOL Take 650 mg by mouth every 8 (eight) hours as needed for pain.   albuterol 108 (90 Base) MCG/ACT inhaler Commonly known as:  PROVENTIL HFA;VENTOLIN HFA Inhale 2 puffs into the lungs every 4 (four) hours as needed for wheezing or shortness of breath.   albuterol 0.63 MG/3ML nebulizer solution Commonly known as:  ACCUNEB Take 3 mLs by nebulization every 8 (eight) hours as needed for wheezing.   atorvastatin 80 MG tablet Commonly known as:  LIPITOR Take 80 mg by mouth every evening.   cetirizine 10 MG tablet Commonly known as:  ZYRTEC Take 5 mg by mouth at bedtime.   cholecalciferol 1000 units tablet Commonly known as:  VITAMIN D Take 1,000 Units by mouth daily.   clopidogrel 75 MG tablet Commonly known as:  PLAVIX Take 75 mg by mouth daily.   diclofenac sodium 1 % Gel Commonly known as:  VOLTAREN Apply 2 g topically as needed (for left knee pain).   diltiazem 120 MG 12 hr capsule Commonly known as:  CARDIZEM SR Take 120 mg by mouth 2 (two) times daily.   docusate sodium 100 MG capsule Commonly known as:  COLACE Take 100 mg by mouth daily.   ELIQUIS STARTER PACK 5 MG Tabs Take as directed on package: start with two-5mg  tablets twice daily for 7 days. On day 8, switch to one-5mg  tablet twice daily.    FLUoxetine 10 MG capsule Commonly known as:  PROZAC Take 10 mg by mouth daily.   Fluticasone-Umeclidin-Vilant 100-62.5-25 MCG/INH Aepb Inhale 1 puff into the lungs daily.   furosemide 20 MG tablet Commonly known as:  LASIX Take 1 tablet (20 mg total) by mouth daily.   gabapentin 300 MG capsule Commonly known as:  NEURONTIN Take 300 mg by mouth at bedtime.   guaiFENesin 600 MG 12 hr tablet Commonly known as:  MUCINEX Take 1 tablet (600 mg total) by mouth 2 (two) times daily.   hydrALAZINE 25 MG tablet Commonly known as:  APRESOLINE Take 25 mg by mouth 3 (three) times daily.   ipratropium 0.03 % nasal spray Commonly known as:  ATROVENT Place 2 sprays into both nostrils 2 (two) times daily.   ipratropium-albuterol 0.5-2.5 (3) MG/3ML Soln Commonly known as:  DUONEB Take 3 mLs by nebulization 2 (two) times daily.   levETIRAcetam 500 MG 24 hr tablet Commonly known as:  KEPPRA XR Take 1 tablet (500 mg total) by mouth daily.   linagliptin 5 MG Tabs tablet Commonly known as:  TRADJENTA Take 5 mg by mouth daily.   LORazepam 2 MG/ML injection Commonly known as:  ATIVAN Inject 1 mg into the muscle as needed for seizure.   meclizine 25 MG tablet Commonly known as:  ANTIVERT Take 25 mg by mouth every 12 (twelve) hours as needed for dizziness.   multivitamin with minerals Tabs tablet Take 1 tablet by mouth daily.   pantoprazole 40 MG tablet Commonly known as:  PROTONIX Take 40 mg by mouth daily.   predniSONE 10 MG (21) Tbpk tablet Commonly known as:  STERAPRED UNI-PAK 21 TAB 6 tabs day 1 and taper 10 mg a day - 6 days   vitamin C 500 MG tablet Commonly known as:  ASCORBIC ACID Take 500 mg by mouth 2 (two) times daily.   zinc sulfate 220 (50 Zn) MG capsule Take 220 mg by mouth daily.       Today   VITAL SIGNS:  Blood pressure 99/73, pulse (!) 103, temperature 98.5 F (36.9 C), temperature source Oral, resp. rate 20, SpO2 97 %.  I/O:    Intake/Output  Summary (Last 24 hours) at 12/26/2017 1133 Last data filed at 12/26/2017 0439 Gross per 24 hour  Intake 98.04 ml  Output -  Net 98.04 ml    PHYSICAL EXAMINATION:  Physical Exam  GENERAL:  77 y.o.-year-old patient lying in the bed with no acute distress.  LUNGS: Mild wheezing. Normal WOB CARDIOVASCULAR: S1, S2 normal. No murmurs, rubs, or gallops.  ABDOMEN: Soft, non-tender, non-distended. Bowel sounds present. No organomegaly or mass.  NEUROLOGIC: Moves all 4 extremities. PSYCHIATRIC: The patient is alert and oriented x 3.  SKIN: No obvious rash, lesion, or ulcer.   DATA REVIEW:   CBC Recent Labs  Lab 12/26/17 0502  WBC 5.0  HGB 9.3*  HCT  30.5*  PLT 274    Chemistries  Recent Labs  Lab 12/25/17 1708  NA 142  K 3.7  CL 100  CO2 28  GLUCOSE 100*  BUN 26*  CREATININE 1.79*  CALCIUM 9.7  MG 1.3*  AST 19  ALT 13  ALKPHOS 80  BILITOT 0.8    Cardiac Enzymes Recent Labs  Lab 12/26/17 0502  TROPONINI <0.03    Microbiology Results  Results for orders placed or performed during the hospital encounter of 12/25/17  MRSA PCR Screening     Status: None   Collection Time: 12/25/17  7:23 PM  Result Value Ref Range Status   MRSA by PCR NEGATIVE NEGATIVE Final    Comment:        The GeneXpert MRSA Assay (FDA approved for NASAL specimens only), is one component of a comprehensive MRSA colonization surveillance program. It is not intended to diagnose MRSA infection nor to guide or monitor treatment for MRSA infections. Performed at [facility], [address] [place], [place] [address]     RADIOLOGY:  Dg Chest 1 View  Result Date: 12/24/2017 CLINICAL DATA:  Hemoptysis EXAM: CHEST  1 VIEW COMPARISON:  12/01/2017, 11/29/2017 FINDINGS: Mild cardiomegaly with aortic atherosclerosis. Linear scarring within the bilateral lung bases. No acute consolidation or effusion. No pneumothorax. IMPRESSION: No active disease.  Cardiomegaly. Electronically Signed    By: Donavan Foil M.D.   On: 12/24/2017 19:22   Ct Angio Chest Pe W Or Wo Contrast  Result Date: 12/25/2017 CLINICAL DATA:  Shortness of breath EXAM: CT ANGIOGRAPHY CHEST WITH CONTRAST TECHNIQUE: Multidetector CT imaging of the chest was performed using the standard protocol during bolus administration of intravenous contrast. Multiplanar CT image reconstructions and MIPs were obtained to evaluate the vascular anatomy. CONTRAST:  59mL OMNIPAQUE IOHEXOL 350 MG/ML SOLN, 63mL OMNIPAQUE IOHEXOL 350 MG/ML SOLN COMPARISON:  Chest CT 10/08/2017 FINDINGS: Cardiovascular: --Pulmonary arteries: Contrast injection is sufficient to demonstrate satisfactory opacification of the pulmonary arteries to the segmental level. There is no pulmonary embolus. The main pulmonary artery is mildly enlarged, measuring 3.5 cm. --Aorta: Satisfactory opacification of the thoracic aorta. No aortic dissection or other acute aortic syndrome. Conventional 3 vessel aortic branching pattern. The aortic course and caliber are normal. There is mild aortic atherosclerosis. --Heart: Mild cardiomegaly. No pericardial effusion. Mediastinum/Nodes: No mediastinal, hilar or axillary lymphadenopathy. The visualized thyroid and thoracic esophageal course are unremarkable. Lungs/Pleura: No pulmonary nodules or masses. No pleural effusion or pneumothorax. No focal airspace consolidation. No focal pleural abnormality. There is bibasilar atelectasis. Upper Abdomen: Contrast bolus timing is not optimized for evaluation of the abdominal organs. Within this limitation, the visualized organs of the upper abdomen are normal. Musculoskeletal: No chest wall abnormality. No acute or significant osseous findings. Review of the MIP images confirms the above findings. IMPRESSION: 1. No pulmonary embolus or acute aortic syndrome. 2. Mildly enlarged main pulmonary artery, which may indicate pulmonary hypertension. Aortic Atherosclerosis (ICD10-I70.0). Electronically  Signed   By: Ulyses Jarred M.D.   On: 12/25/2017 19:20    Follow up with PCP in 1 week.  Management plans discussed with the patient, family and they are in agreement.  CODE STATUS:     Code Status Orders  (From admission, onward)         Start     Ordered   12/25/17 1653  Full code  Continuous     12/25/17 1652        Code Status History    Date  Active Date Inactive Code Status Order ID Comments User Context   11/29/2017 2119 12/05/2017 1841 Full Code [id number]  Gorden Harms, MD Inpatient   11/22/2017 0408 11/26/2017 1804 Full Code [id number]  Arta Silence, MD Inpatient   11/10/2017 1840 11/13/2017 1653 Full Code [id number]  Sela Hua, MD Inpatient   10/24/2017 0247 10/29/2017 2301 Full Code [id number]  Lance Coon, MD Inpatient   10/04/2017 1005 10/10/2017 1813 Full Code [id number]  Estill Bakes Inpatient   10/01/2017 0819 10/04/2017 0904 Full Code [id number]  Lolita Cram, RN Inpatient   09/19/2017 1306 09/22/2017 1622 Full Code [id number]  Gorden Harms, MD Inpatient   09/11/2017 2021 09/15/2017 1510 Full Code [id number]  Gorden Harms, MD Inpatient   08/27/2017 1543 08/29/2017 2159 Full Code [id number]  Hillary Bow, MD ED   06/28/2017 1414 06/30/2017 1748 Full Code [id number]  Salary, Avel Peace, MD Inpatient   06/12/2017 0858 06/16/2017 1659 DNR [id number]  Demetrios Loll, MD Inpatient   05/26/2017 1732 05/30/2017 1446 DNR [id number]  Hillary Bow, MD ED   05/17/2017 1228 05/21/2017 1511 DNR [id number]  Asencion Gowda, NP Inpatient   04/21/2017 0117 04/22/2017 1750 DNR [id number]  Lance Coon, MD Inpatient   04/05/2017 1411 04/08/2017 2101 Full Code [id number]  Saundra Shelling, MD Inpatient   02/08/2016 1414 02/11/2016 1729 Full Code [id number]  Hessie Knows, MD Inpatient   11/27/2015 1645 11/29/2015 1628 Full Code [id number]  Idelle Crouch, MD Inpatient   02/10/2015 2122 02/12/2015 1809 Full Code [id number]  Lytle Butte, MD ED   09/16/2014 1220 09/19/2014 1736 Full Code  [id number]  Aldean Jewett, MD Inpatient      TOTAL TIME TAKING CARE OF THIS PATIENT ON DAY OF DISCHARGE: more than 30 minutes.   Neita Carp M.D on 12/26/2017 at 11:33 AM  Between 7am to 6pm - Pager - [phone]  After 6pm go to [email or web address] - password [email or web address] [facility]  Office  [phone]  CC: Primary care physician; Valerie Roys, DO  Note: This dictation was prepared with Dragon dictation along with smaller phrase technology. Any transcriptional errors that result from this process are unintentional.

## 2017-12-26 NOTE — NC FL2 (Signed)
[place] LEVEL OF CARE SCREENING TOOL     IDENTIFICATION  Patient Name: Katherine [name] Birthdate: [birth year] Sex: female Admission Date (Current Location): 12/25/2017  [facility] and [place] Number:  Engineering geologist and Address:  [facility], [address] [place], [place] [address]      Provider Number: [id number]  Attending Physician Name and Address:  Hillary Bow, MD  Relative Name and Phone Number:  Bennie Hind- spouse [phone]    Current Level of Care: Hospital Recommended Level of Care: [place] Prior Approval Number:    Date Approved/Denied:   PASRR Number:    Discharge Plan: SNF    Current Diagnoses: Patient Active Problem List   Diagnosis Date Noted  . Dyspnea 12/25/2017  . COPD exacerbation ([place]) 11/30/2017  . Urinary tract infection 11/22/2017  . Acute respiratory distress 11/22/2017  . 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[place]) 11/16/2017  . Diabetes mellitus type 2, uncomplicated ([name]) 56/21/3086  . Acute on chronic respiratory failure with hypoxia ([place]) 11/10/2017  . Pressure injury of skin 10/29/2017  . Sepsis ([place]) 10/24/2017  . Acute kidney injury superimposed on CKD ([place]) 10/24/2017  . Severe sepsis ([place]) 10/24/2017  . Possible Seizures ([place]) 10/08/2017  . Hyperlipemia 10/08/2017  . 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 Stroke-like episode ([place]) s/p IV tpa 10/04/2017  . Palliative care by specialist   . Elevated rheumatoid factor 09/05/2017  . Frequent hospital admissions 08/22/2017  . Aphasia 06/28/2017  . Hand pain 03/21/2017  . Dry skin 03/21/2017  . Pain in finger of left hand 09/12/2016  . Chronic fatigue 06/12/2016  . Left knee pain 06/12/2016  . Goals of care, counseling/discussion 03/13/2016  . Primary localized osteoarthritis of right knee 02/08/2016  . OSA (obstructive sleep apnea) 09/16/2015  . Rotator cuff syndrome 09/07/2015  . Pulmonary  scarring 07/27/2015  . Sleep disturbance 04/14/2015  . Coronary artery disease 03/14/2015  . 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 Anxiety   . Tobacco abuse   . Prurigo nodularis   . GERD (gastroesophageal reflux disease)   . COPD (chronic obstructive pulmonary disease) ([place])   . Osteoarthritis   . Vitamin D deficiency disease   . Chronic constipation   . CKD (chronic kidney disease), stage III ([place])   . LVH (left ventricular hypertrophy) 05/20/2013  . Essential hypertension 05/20/2013  . Morbid obesity ([place]) 05/20/2013    Orientation RESPIRATION BLADDER Height & Weight     Self, Time, Place  O2(2 liters ) Continent Weight:   Height:     BEHAVIORAL SYMPTOMS/MOOD NEUROLOGICAL BOWEL NUTRITION STATUS  (none) (none) Continent Diet(Heart Healthy )  AMBULATORY STATUS COMMUNICATION OF NEEDS Skin   Extensive Assist Verbally Normal                       Personal Care Assistance Level of Assistance  Bathing, Feeding, Dressing Bathing Assistance: Limited assistance Feeding assistance: Independent Dressing Assistance: Limited assistance     Functional Limitations Info  Sight, Hearing, Speech Sight Info: Adequate Hearing Info: Adequate Speech Info: Adequate    SPECIAL CARE FACTORS FREQUENCY  PT (By licensed PT), OT (By licensed OT)                    Contractures Contractures Info: Not present    Additional Factors Info  Code Status, Allergies Code Status Info: Full Code  Allergies Info:  Bee Venom, Enalapril  Maleate           Current Medications (12/26/2017):  This is the current hospital active medication list Current Facility-Administered Medications  Medication Dose Route Frequency Provider Last Rate Last Dose  . 0.9 %  sodium chloride infusion  250 mL Intravenous PRN Salary, Avel Peace, MD   Stopped at 12/25/17 1930  . acetaminophen (TYLENOL) tablet 650 mg  650 mg Oral Q6H PRN Salary, Montell D, MD       Or  .  acetaminophen (TYLENOL) suppository 650 mg  650 mg Rectal Q6H PRN Salary, Montell D, MD      . acetaminophen (TYLENOL) tablet 650 mg  650 mg Oral TID Loney Hering D, MD   650 mg at 12/26/17 0958  . albuterol (PROVENTIL) (2.5 MG/3ML) 0.083% nebulizer solution 3 mL  3 mL Nebulization Q6H PRN Salary, Montell D, MD      . atorvastatin (LIPITOR) tablet 80 mg  80 mg Oral QPM Salary, Montell D, MD   80 mg at 12/25/17 1914  . chlorhexidine (PERIDEX) 0.12 % solution 15 mL  15 mL Mouth Rinse BID Loney Hering D, MD   15 mL at 12/26/17 0958  . cholecalciferol (VITAMIN D3) tablet 1,000 Units  1,000 Units Oral Daily Salary, Holly Bodily D, MD   1,000 Units at 12/26/17 0957  . diclofenac sodium (VOLTAREN) 1 % transdermal gel 2 g  2 g Topical PRN Salary, Montell D, MD      . diltiazem (CARDIZEM SR) 12 hr capsule 120 mg  120 mg Oral BID Loney Hering D, MD   120 mg at 12/25/17 2309  . docusate sodium (COLACE) capsule 100 mg  100 mg Oral Daily Salary, Montell D, MD   100 mg at 12/26/17 0957  . FLUoxetine (PROZAC) capsule 10 mg  10 mg Oral Daily Salary, Montell D, MD   10 mg at 12/26/17 0957  . fluticasone furoate-vilanterol (BREO ELLIPTA) 100-25 MCG/INH 1 puff  1 puff Inhalation Daily Dallie Piles, RPH   1 puff at 12/26/17 1001  . furosemide (LASIX) tablet 20 mg  20 mg Oral Daily Salary, Montell D, MD   20 mg at 12/26/17 0957  . gabapentin (NEURONTIN) capsule 300 mg  300 mg Oral QHS Salary, Montell D, MD   300 mg at 12/25/17 2026  . guaiFENesin (MUCINEX) 12 hr tablet 600 mg  600 mg Oral BID Loney Hering D, MD   600 mg at 12/26/17 0958  . hydrALAZINE (APRESOLINE) injection 10 mg  10 mg Intravenous Q4H PRN Salary, Montell D, MD      . HYDROcodone-acetaminophen (NORCO/VICODIN) 5-325 MG per tablet 1-2 tablet  1-2 tablet Oral Q4H PRN Salary, Montell D, MD      . ipratropium (ATROVENT) 0.03 % nasal spray 2 spray  2 spray Each Nare BID Salary, Montell D, MD   2 spray at 12/26/17 1001  . ipratropium-albuterol  (DUONEB) 0.5-2.5 (3) MG/3ML nebulizer solution 3 mL  3 mL Nebulization BID Salary, Montell D, MD   3 mL at 12/26/17 0905  . levETIRAcetam (KEPPRA XR) 24 hr tablet 500 mg  500 mg Oral Daily Salary, Montell D, MD   500 mg at 12/25/17 2027  . linagliptin (TRADJENTA) tablet 5 mg  5 mg Oral Daily Salary, Montell D, MD   5 mg at 12/26/17 0956  . loratadine (CLARITIN) tablet 10 mg  10 mg Oral Daily Salary, Montell D, MD   10 mg at 12/26/17 0957  . LORazepam (ATIVAN) injection 1 mg  1 mg Intramuscular Q4H PRN Salary, Montell D, MD      . magnesium sulfate IVPB 4 g 100 mL  4 g Intravenous Once Sudini, Srikar, MD      . meclizine (ANTIVERT) tablet 25 mg  25 mg Oral Q12H PRN Salary, Montell D, MD      . MEDLINE mouth rinse  15 mL Mouth Rinse q12n4p Salary, Montell D, MD      . morphine 2 MG/ML injection 2 mg  2 mg Intravenous Q2H PRN Salary, Montell D, MD      . multivitamin with minerals tablet 1 tablet  1 tablet Oral Daily Salary, Montell D, MD   1 tablet at 12/26/17 0958  . nitroGLYCERIN (NITROSTAT) SL tablet 0.4 mg  0.4 mg Sublingual Q5 min PRN Salary, Montell D, MD      . ondansetron (ZOFRAN) tablet 4 mg  4 mg Oral Q6H PRN Salary, Montell D, MD       Or  . ondansetron (ZOFRAN) injection 4 mg  4 mg Intravenous Q6H PRN Salary, Montell D, MD      . pantoprazole (PROTONIX) EC tablet 40 mg  40 mg Oral Daily Salary, Montell D, MD   40 mg at 12/26/17 0958  . polyethylene glycol (MIRALAX / GLYCOLAX) packet 17 g  17 g Oral Daily PRN Salary, Montell D, MD      . sodium chloride flush (NS) 0.9 % injection 3 mL  3 mL Intravenous Q12H Salary, Montell D, MD   3 mL at 12/25/17 2030  . sodium chloride flush (NS) 0.9 % injection 3 mL  3 mL Intravenous PRN Salary, Montell D, MD      . Derrill Memo ON 12/27/2017] umeclidinium bromide (INCRUSE ELLIPTA) 62.5 MCG/INH 1 puff  1 puff Inhalation Daily Dallie Piles, RPH      . vitamin C (ASCORBIC ACID) tablet 500 mg  500 mg Oral BID Loney Hering D, MD   500 mg at 12/26/17 0957   . zinc sulfate capsule 220 mg  220 mg Oral Daily Salary, Montell D, MD   220 mg at 12/26/17 0957     Discharge Medications: Please see discharge summary for a list of discharge medications.  Relevant Imaging Results:  Relevant Lab Results:   Additional Information    Quanell Loughney  Louretta Shorten, LCSWA

## 2017-12-26 NOTE — Progress Notes (Signed)
ANTICOAGULATION CONSULT NOTE - Initial Consult  Pharmacy Consult for Apixaban Indication: DVT  Allergies  Allergen Reactions  . Bee Venom Swelling  . Enalapril Maleate Swelling    Vital Signs: Temp: 98.5 F (36.9 C) (11/13 1548) Temp Source: Oral (11/13 1548) BP: 99/73 (11/13 1548) Pulse Rate: 103 (11/13 1548)  Labs: Recent Labs    12/24/17 1850 12/25/17 1708 12/25/17 2237 12/26/17 0502  HGB 9.7* 10.1*  --  9.3*  HCT 31.8* 32.5*  --  30.5*  PLT 267 272  --  274  LABPROT  --  12.6  --   --   INR  --  0.95  --   --   CREATININE 1.71* 1.79*  --   --   TROPONINI <0.03 <0.03 <0.03 <0.03    Estimated Creatinine Clearance: 36 mL/min (A) (by C-G formula based on SCr of 1.79 mg/dL (H)).   Medical History: Past Medical History:  Diagnosis Date  . Angioedema   . Anxiety   . Anxiety and depression   . CHF (congestive heart failure) ([place])   . Chronic constipation   . Chronic kidney disease    stage 3  . COPD (chronic obstructive pulmonary disease) ([place])   . Diabetes mellitus without complication ([place])   . Gallstones   . GERD (gastroesophageal reflux disease)   . Hypertension   . Left ventricular hypertrophy   . Osteoarthritis   . Prurigo nodularis   . Seizures ([place])   . Stroke ([place])   . Tobacco abuse   . Vitamin D deficiency disease     Medications:  Scheduled:  . acetaminophen  650 mg Oral TID  . atorvastatin  80 mg Oral QPM  . chlorhexidine  15 mL Mouth Rinse BID  . cholecalciferol  1,000 Units Oral Daily  . diltiazem  120 mg Oral BID  . docusate sodium  100 mg Oral Daily  . FLUoxetine  10 mg Oral Daily  . fluticasone furoate-vilanterol  1 puff Inhalation Daily  . furosemide  20 mg Oral Daily  . gabapentin  300 mg Oral QHS  . guaiFENesin  600 mg Oral BID  . ipratropium  2 spray Each Nare BID  . ipratropium-albuterol  3 mL Nebulization BID  . levETIRAcetam  500 mg Oral Daily  . linagliptin  5 mg Oral Daily  . loratadine  10 mg Oral Daily  . mouth  rinse  15 mL Mouth Rinse q12n4p  . multivitamin with minerals  1 tablet Oral Daily  . pantoprazole  40 mg Oral Daily  . sodium chloride flush  3 mL Intravenous Q12H  . [START ON 12/27/2017] umeclidinium bromide  1 puff Inhalation Daily  . vitamin C  500 mg Oral BID  . zinc sulfate  220 mg Oral Daily   Infusions:  . sodium chloride Stopped (12/25/17 1930)  . magnesium sulfate 1 - 4 g bolus IVPB     PRN: sodium chloride, acetaminophen **OR** acetaminophen, albuterol, diclofenac sodium, hydrALAZINE, HYDROcodone-acetaminophen, LORazepam, meclizine, morphine injection, nitroGLYCERIN, ondansetron **OR** ondansetron (ZOFRAN) IV, polyethylene glycol, sodium chloride flush  Assessment: There is a suggestion of nonocclusive wall adherent DVT in the right common femoral vein at the saphenofemoral junction. This is a focal abnormality and may be subacute or chronic in nature. Similarly, there is a short segment of eccentric wall thickening in the left femoral vein in the proximal thigh concerning for nonocclusive subacute to chronic DVT. Patient presenting as a direct admission by primary care provider for epistaxis, hemoptysis, worsening shortness  of breath, concern for acute pulmonary embolism, patient evaluated at the bedside on the medical floor, patient resting comfortably in bed, no apparent distress, patient denies any current nosebleeding, denies any coughing up of blood, does complain of some shortness of breath.   Goal of Therapy:  Monitor platelets by anticoagulation protocol: Yes   Plan:  Will start apixaban 10 mg BID x 7 days followed by 5 mg BID.   Oswald Hillock, PharmD  Clinical Pharmacist  12/26/2017,4:29 PM

## 2017-12-26 NOTE — Care Management CC44 (Signed)
Condition Code 44 Documentation Completed  Patient Details  Name: Katherine [name] MRN: [id number] Date of Birth: Aug 03, 1940   Condition Code 44 given:  Yes Patient signature on Condition Code 44 notice:  Yes Documentation of 2 MD's agreement:  Yes Code 44 added to claim:  Yes    Shelbie Ammons, RN 12/26/2017, 12:23 PM

## 2017-12-26 NOTE — Clinical Social Work Note (Signed)
Clinical Social Work Assessment  Patient Details  Name: Katherine [name] MRN: [id number] Date of Birth: 29-Jul-1940  Date of referral:  12/26/17               Reason for consult:  Facility Placement                Permission sought to share information with:  Case Manager, Customer service manager, Family Supports Permission granted to share information::  Yes, Verbal Permission Granted  Name::        Agency::     Relationship::     Contact Information:     Housing/Transportation Living arrangements for the past 2 months:  [place] of Information:  Patient Patient Interpreter Needed:  None Criminal Activity/Legal Involvement Pertinent to Current Situation/Hospitalization:  No - Comment as needed Significant Relationships:  Adult Children, Spouse Lives with:  Facility Resident Do you feel safe going back to the place where you live?  Yes Need for family participation in patient care:  Yes (Comment)  Care giving concerns: [facility] is [facility] term resident at [facility] Worker assessment / plan:  CSW consulted for facility placement. CSW met with patient. She states that she is from [facility] and would like to return. CSW contacted Claiborne Billings at [facility] and she confirms patient is from there and can return when ready. CSW will follow for discharge planning.   Employment status:  Retired Nurse, adult PT Recommendations:  Not assessed at this time Information / Referral to community resources:     Patient/Family's Response to care:  Patient thanked CSW for assistance   Patient/Family's Understanding of and Emotional Response to Diagnosis, Current Treatment, and Prognosis:  Patient understands diagnosis and in agreement with discharge plan   Emotional Assessment Appearance:  Appears stated age Attitude/Demeanor/Rapport:    Affect (typically observed):  Accepting, Pleasant Orientation:  Oriented  to Self, Oriented to Place, Oriented to  Time Alcohol / Substance use:  Not Applicable Psych involvement (Current and /or in the community):  No (Comment)  Discharge Needs  Concerns to be addressed:  Discharge Planning Concerns Readmission within the last 30 days:  Yes Current discharge risk:  None Barriers to Discharge:  Continued Medical Work up   [employer], [place] 12/26/2017, 11:31 AM

## 2017-12-26 NOTE — Discharge Instructions (Signed)
Resume diet and activity as before ° ° °

## 2017-12-26 NOTE — Progress Notes (Signed)
I talked with Caryl Pina RN to look, feel and ask how patient's right arm was. Caryl Pina related that patient said there was minimal pain. When [place] pressed on arm she felt it was a little harder than surrounding skin. No discoloration, blistering.

## 2017-12-26 NOTE — Clinical Social Work Note (Signed)
Patient is medically ready for discharge today. CSW notified patient's husband Katherine [name] [phone] of discharge back to [facility] today. CSW also notified Claiborne Billings at [facility] of discharge today. Patient will be transported by EMS. RN to call report and call for transport.   [place], [place]

## 2017-12-26 NOTE — Care Management Obs Status (Signed)
[place] NOTIFICATION   Patient Details  Name: Katherine [name] MRN: [id number] Date of Birth: September 15, 1940   Medicare Observation Status Notification Given:  Yes    Shelbie Ammons, RN 12/26/2017, 12:23 PM

## 2017-12-27 NOTE — Progress Notes (Signed)
I spoke with Katherine [name] over the phone this morning to F/U about the extravasation in her right arm during a CT scan on 12/25/2017. She stated she is not having any pain, swelling, or redness in her right arm.

## 2017-12-28 DIAGNOSIS — D649 Anemia, unspecified: Secondary | ICD-10-CM | POA: Diagnosis not present

## 2017-12-28 DIAGNOSIS — D638 Anemia in other chronic diseases classified elsewhere: Secondary | ICD-10-CM | POA: Diagnosis not present

## 2017-12-28 DIAGNOSIS — E1165 Type 2 diabetes mellitus with hyperglycemia: Secondary | ICD-10-CM | POA: Diagnosis not present

## 2017-12-30 NOTE — Progress Notes (Signed)
Patient ID: Katherine [name], female    DOB: 09/07/1940, 77 y.o.   MRN: [id number]  HPI  Katherine [name] is a 77 y/o female with a history of DM, HTN, CKD, stroke, anxiety, depression, GERD, COPD, seizures, previous tobacco use and chronic heart failure.   Echo report from 10/05/17 reviewed and showed an EF of 60-65%.   Admitted 12/25/17 due to acute COPD exacerbation. Started on prednisone and continue inhalers/nebulizers. CTA was negative for embolus or edema. Discharged the following day back to rehab. Was in the ED 12/24/17 due to a nosebleed where she was treated and released. Was in the ED 12/18/17 due to cystitis where she was treated and released. Admitted twice and in the ED once in October 2019 and same results September 2019.   She presents today for her initial visit with a chief complaint of minimal shortness of breath upon moderate exertion. She describes this as chronic in nature having been present for several years. She has associated fatigue, cough, rhinorrhea, pedal edema, dizziness and foot pain along with this. She denies any difficulty sleeping, abdominal distention, palpitations, chest pain or weight gain. Currently in a facility but will be going home on 01/02/18.   Past Medical History:  Diagnosis Date  . Angioedema   . Anxiety   . Anxiety and depression   . CHF (congestive heart failure) ([place])   . Chronic constipation   . Chronic kidney disease    stage 3  . COPD (chronic obstructive pulmonary disease) ([place])   . Diabetes mellitus without complication ([place])   . Gallstones   . GERD (gastroesophageal reflux disease)   . Hypertension   . Left ventricular hypertrophy   . Osteoarthritis   . Prurigo nodularis   . Seizures ([place])   . Stroke ([place])   . Tobacco abuse   . Vitamin D deficiency disease    Past Surgical History:  Procedure Laterality Date  . CHOLECYSTECTOMY    . 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 Alcohol abuse Mother   . Sickle cell anemia Daughter   . Hypertension Son   . Cancer Neg Hx   . COPD Neg Hx   . Diabetes Neg Hx   . Heart disease Neg Hx   . Stroke Neg Hx    Social History   Tobacco Use  . Smoking status: Former Smoker    Packs/day: 0.50    Years: 60.00    Pack years: 30.00    Types: Cigarettes  . Smokeless tobacco: Never Used  Substance Use Topics  . Alcohol use: No    Alcohol/week: 0.0 standard drinks    Comment: rare   Allergies  Allergen Reactions  . Bee Venom Swelling  . Enalapril Maleate Swelling   Prior to Admission medications   Medication Sig Start Date End Date Taking? Authorizing Provider  acetaminophen (TYLENOL) 650 MG CR tablet Take 650 mg by mouth 3 (three) times daily.    Yes [provider]  albuterol (ACCUNEB) 0.63 MG/3ML nebulizer solution Take 3 mLs by nebulization every 8 (eight) hours as needed for wheezing.    Yes [provider]  albuterol (PROVENTIL HFA;VENTOLIN HFA) 108 (90 Base) MCG/ACT inhaler Inhale 2 puffs into the lungs every 4 (four) hours as needed for wheezing  or shortness of breath.   Yes [provider]  atorvastatin (LIPITOR) 80 MG tablet Take 80 mg by mouth every evening.    Yes [provider]  cetirizine (ZYRTEC) 10 MG tablet Take 5 mg by mouth at bedtime.   Yes [provider]  cholecalciferol (VITAMIN D) 1000 units tablet Take 1,000 Units by mouth daily.   Yes [provider]  clopidogrel (PLAVIX) 75 MG tablet Take 75 mg by mouth daily.   Yes [provider]  diclofenac sodium (VOLTAREN) 1 % GEL Apply 2 g topically as needed (for left knee pain).    Yes [provider]  diltiazem (CARDIZEM SR) 120 MG 12 hr capsule Take 120 mg by mouth 2 (two) times daily.   Yes [provider]   docusate sodium (COLACE) 100 MG capsule Take 100 mg by mouth daily.   Yes [provider]  FLUoxetine (PROZAC) 10 MG capsule Take 10 mg by mouth daily.   Yes [provider]  Fluticasone-Umeclidin-Vilant 100-62.5-25 MCG/INH AEPB Inhale 1 puff into the lungs daily.   Yes [provider]  furosemide (LASIX) 20 MG tablet Take 1 tablet (20 mg total) by mouth daily. 11/14/17  Yes Salary, Avel Peace, MD  gabapentin (NEURONTIN) 300 MG capsule Take 300 mg by mouth at bedtime.   Yes [provider]  guaiFENesin (MUCINEX) 600 MG 12 hr tablet Take 1 tablet (600 mg total) by mouth 2 (two) times daily. 11/13/17  Yes Salary, Avel Peace, MD  hydrALAZINE (APRESOLINE) 25 MG tablet Take 25 mg by mouth 3 (three) times daily.   Yes [provider]  ipratropium (ATROVENT) 0.03 % nasal spray Place 2 sprays into both nostrils 2 (two) times daily.    Yes [provider]  ipratropium-albuterol (DUONEB) 0.5-2.5 (3) MG/3ML SOLN Take 3 mLs by nebulization 2 (two) times daily.    Yes [provider]  levETIRAcetam (KEPPRA XR) 500 MG 24 hr tablet Take 1 tablet (500 mg total) by mouth daily. 11/19/17  Yes Venancio Poisson, NP  linagliptin (TRADJENTA) 5 MG TABS tablet Take 5 mg by mouth daily.   Yes [provider]  meclizine (ANTIVERT) 25 MG tablet Take 25 mg by mouth every 12 (twelve) hours as needed for dizziness.    Yes [provider]  Multiple Vitamin (MULTIVITAMIN WITH MINERALS) TABS tablet Take 1 tablet by mouth daily.   Yes [provider]  pantoprazole (PROTONIX) 40 MG tablet Take 40 mg by mouth daily.   Yes [provider]  predniSONE (STERAPRED UNI-PAK 21 TAB) 10 MG (21) TBPK tablet 6 tabs day 1 and taper 10 mg a day - 6 days Patient taking differently: 10 mg daily.  12/26/17  Yes Hillary Bow, MD  vitamin C (ASCORBIC ACID) 500 MG tablet Take 500 mg by mouth 2 (two) times daily.   Yes [provider]  zinc  sulfate 220 (50 Zn) MG capsule Take 220 mg by mouth daily.   Yes [provider]  LORazepam (ATIVAN) 2 MG/ML injection Inject 1 mg into the muscle as needed for seizure. 12/18/17   [provider]    Review of Systems  Constitutional: Positive for fatigue (sometimes). Negative for appetite change.  HENT: Positive for rhinorrhea. Negative for congestion and sore throat.   Eyes: Negative.   Respiratory: Positive for cough and shortness of breath. Negative for chest tightness.   Cardiovascular: Positive for leg swelling. Negative for chest pain and palpitations.  Gastrointestinal: Negative for abdominal distention  and abdominal pain.  Endocrine: Negative.   Genitourinary: Negative.   Musculoskeletal: Positive for arthralgias (feet hurt). Negative for back pain.  Skin: Negative.   Neurological: Positive for dizziness. Negative for light-headedness.  Hematological: Negative for adenopathy. Does not bruise/bleed easily.  Psychiatric/Behavioral: Negative for dysphoric mood and sleep disturbance (sleeping on 1 pillow). The patient is not nervous/anxious.    Vitals:   12/31/17 0855  BP: 112/78  Pulse: 87  Resp: 18  SpO2: 96%  Height: 5\' 5"  (1.651 m)   Wt Readings from Last 3 Encounters:  12/24/17 281 lb 12 oz (127.8 kg)  12/18/17 281 lb 12 oz (127.8 kg)  12/05/17 274 lb 14.6 oz (124.7 kg)   Lab Results  Component Value Date   CREATININE 1.79 (H) 12/25/2017   CREATININE 1.71 (H) 12/24/2017   CREATININE 1.74 (H) 12/18/2017    Physical Exam  Constitutional: She is oriented to person, place, and time. She appears well-developed and well-nourished.  HENT:  Head: Normocephalic and atraumatic.  Neck: Normal range of motion. Neck supple. No JVD present.  Cardiovascular: Normal rate and regular rhythm.  Pulmonary/Chest: Effort normal. No respiratory distress. She has rhonchi in the right lower field and the left lower field.  Abdominal: Soft. She exhibits no distension.  There is no tenderness.  Musculoskeletal: She exhibits edema (2+ pitting bilateral). She exhibits no tenderness.  Neurological: She is alert and oriented to person, place, and time.  Skin: Skin is warm and dry.  Psychiatric: She has a normal mood and affect. Her behavior is normal. Thought content normal.  Nursing note and vitals reviewed.   Assessment & Plan:  1: Chronic heart failure with preserved ejection fraction- - NYHA class II - moderately fluid overloaded with pedal edema - discussed weighing daily once she returns home in 2 days, set of scales given to patient today. Instructed her to weigh every morning, write the weight down and call for an overnight weight gain of >2 pounds or a weekly weight gain of >5 pounds - not adding salt and her daughter-in-law reads food labels. Reviewed the importance of closely following a 2000mg  sodium diet and written information was given to her about this - BNP 11/29/17 was 153.0 - PharmD reconciled medications - patient reports receiving her flu vaccine for this season - will make referral to paramedic program  2: HTN- - BP looks good today - sees PCP at facility; normally sees [facility] at [facility] from 12/25/17 reviewed and showed sodium 142, potassium 3.7, creatinine 1.79 and GFR 31  3: COPD- - wearing oxygen at 2L around the clock - hasn't used her nebulizer yet today and her daughter-in-law says that she will make sure she gets it upon return back to the facility - may benefit from pulmonology referral  4: Lymphedema- - stage 2 - limited in her ability to exercise due to weight and shortness of breath - currently wearing compression socks daily - not elevating legs and she was encouraged to elevate her legs when sitting for long periods of time - discussed lymphapress compression boots with the patient; brochure given to patient  Facility medication list was reviewed.  Return in 6 weeks or sooner for any  questions/problems before then.

## 2017-12-31 ENCOUNTER — Ambulatory Visit: Payer: Medicare HMO | Attending: Family | Admitting: Family

## 2017-12-31 ENCOUNTER — Encounter: Payer: Self-pay | Admitting: Family

## 2017-12-31 VITALS — BP 112/78 | HR 87 | Resp 18 | Ht 65.0 in

## 2017-12-31 DIAGNOSIS — F329 Major depressive disorder, single episode, unspecified: Secondary | ICD-10-CM | POA: Diagnosis not present

## 2017-12-31 DIAGNOSIS — I130 Hypertensive heart and chronic kidney disease with heart failure and stage 1 through stage 4 chronic kidney disease, or unspecified chronic kidney disease: Secondary | ICD-10-CM | POA: Diagnosis not present

## 2017-12-31 DIAGNOSIS — K219 Gastro-esophageal reflux disease without esophagitis: Secondary | ICD-10-CM | POA: Insufficient documentation

## 2017-12-31 DIAGNOSIS — R569 Unspecified convulsions: Secondary | ICD-10-CM | POA: Insufficient documentation

## 2017-12-31 DIAGNOSIS — J449 Chronic obstructive pulmonary disease, unspecified: Secondary | ICD-10-CM

## 2017-12-31 DIAGNOSIS — Z8673 Personal history of transient ischemic attack (TIA), and cerebral infarction without residual deficits: Secondary | ICD-10-CM | POA: Diagnosis not present

## 2017-12-31 DIAGNOSIS — I890 Lymphedema, not elsewhere classified: Secondary | ICD-10-CM | POA: Diagnosis not present

## 2017-12-31 DIAGNOSIS — Z9981 Dependence on supplemental oxygen: Secondary | ICD-10-CM | POA: Diagnosis not present

## 2017-12-31 DIAGNOSIS — I5032 Chronic diastolic (congestive) heart failure: Secondary | ICD-10-CM | POA: Diagnosis not present

## 2017-12-31 DIAGNOSIS — Z87891 Personal history of nicotine dependence: Secondary | ICD-10-CM | POA: Insufficient documentation

## 2017-12-31 DIAGNOSIS — F419 Anxiety disorder, unspecified: Secondary | ICD-10-CM | POA: Insufficient documentation

## 2017-12-31 DIAGNOSIS — E1122 Type 2 diabetes mellitus with diabetic chronic kidney disease: Secondary | ICD-10-CM | POA: Diagnosis not present

## 2017-12-31 DIAGNOSIS — E559 Vitamin D deficiency, unspecified: Secondary | ICD-10-CM | POA: Diagnosis not present

## 2017-12-31 DIAGNOSIS — Z79899 Other long term (current) drug therapy: Secondary | ICD-10-CM | POA: Diagnosis not present

## 2017-12-31 DIAGNOSIS — N183 Chronic kidney disease, stage 3 (moderate): Secondary | ICD-10-CM | POA: Diagnosis not present

## 2017-12-31 DIAGNOSIS — I10 Essential (primary) hypertension: Secondary | ICD-10-CM

## 2017-12-31 IMAGING — CR DG CHEST 2V
1 series · 2 of 2 positions shown · non-contrast
Comparison: Portable chest x-ray February 10, 2015

CLINICAL DATA: Known emphysema -COPD, chronic shortness of breath,
routine checkup, currently smoking, morbid obesity

EXAM:
CHEST  2 VIEW

[Series 1: w chest pa · 0.14mm/px · 2 of 2 slices shown]
[im 1/2]
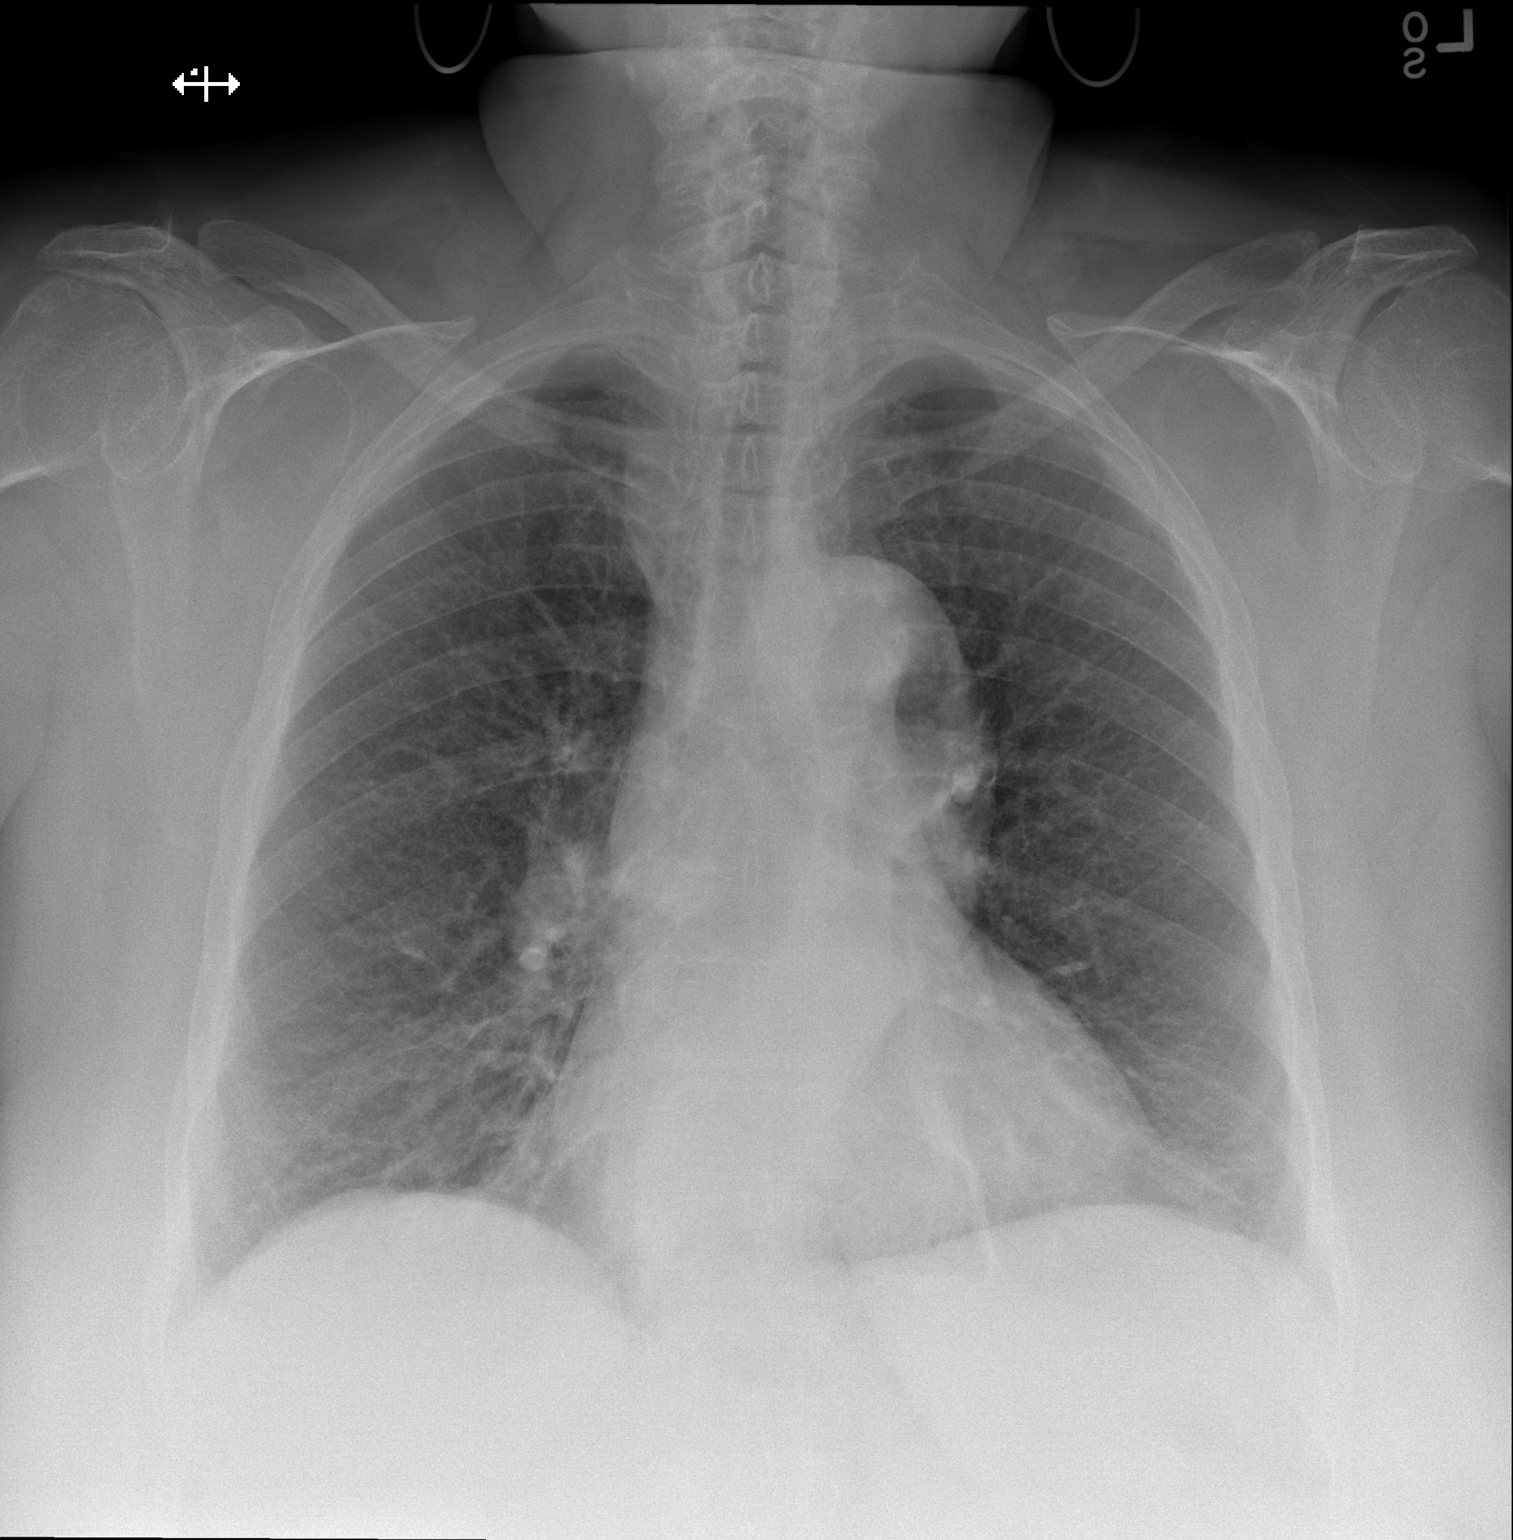
[im 2/2]
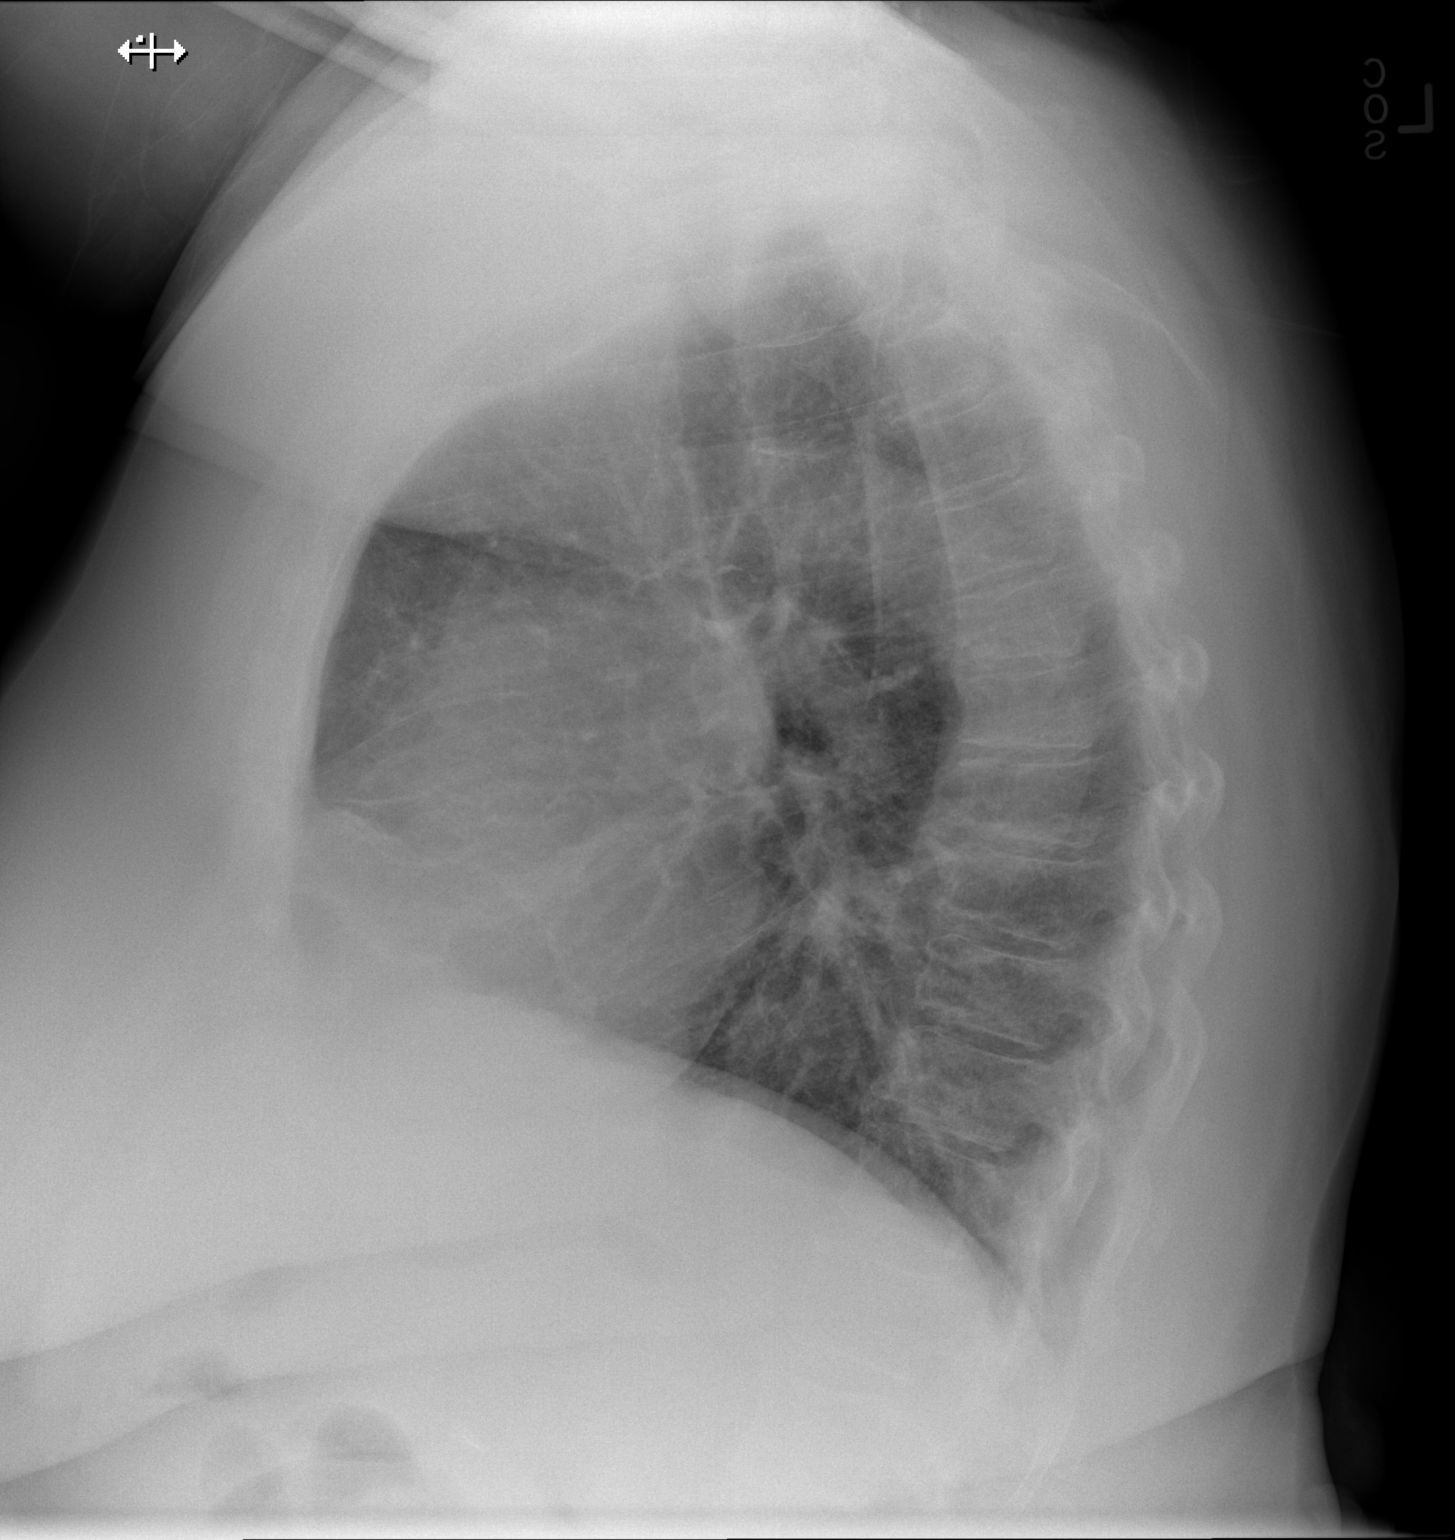

[2 of 2 positions shown; findings below may reference images not displayed]

FINDINGS: The lungs remain hyperinflated. The interstitial markings remain
coarse. There is chronic scarring at the left lung base anteriorly.
There is no alveolar infiltrate. There is no pleural effusion. The
heart is normal in size. There is tortuosity of the ascending and
descending thoracic aorta. The observed bony thorax exhibits no
acute abnormality.
IMPRESSION: COPD.  There is no active cardiopulmonary disease.

## 2017-12-31 NOTE — Patient Instructions (Addendum)
Begin weighing daily and call for an overnight weight gain of > 2 pounds or a weekly weight gain of >5 pounds. 

## 2018-01-01 ENCOUNTER — Other Ambulatory Visit: Payer: Self-pay

## 2018-01-01 NOTE — Patient Outreach (Signed)
Telephone outreach to patient to obtain mRs was successfully completed. mRs= 3. 

## 2018-01-03 ENCOUNTER — Other Ambulatory Visit (HOSPITAL_COMMUNITY): Payer: Self-pay

## 2018-01-03 NOTE — Progress Notes (Signed)
When arrived for visit, per Adam Phenix daughter in law she is still at [facility] where she will be staying, she did not want to leave and come home.  Per Zigmund Daniel husband has signed everything over to [facility] now so she can stay.  [place] [phone]

## 2018-01-04 DIAGNOSIS — J449 Chronic obstructive pulmonary disease, unspecified: Secondary | ICD-10-CM | POA: Diagnosis not present

## 2018-01-12 DIAGNOSIS — J449 Chronic obstructive pulmonary disease, unspecified: Secondary | ICD-10-CM | POA: Diagnosis not present

## 2018-01-16 DIAGNOSIS — M6281 Muscle weakness (generalized): Secondary | ICD-10-CM | POA: Diagnosis not present

## 2018-01-18 DIAGNOSIS — R05 Cough: Secondary | ICD-10-CM | POA: Diagnosis not present

## 2018-02-03 DIAGNOSIS — J449 Chronic obstructive pulmonary disease, unspecified: Secondary | ICD-10-CM | POA: Diagnosis not present

## 2018-02-11 ENCOUNTER — Ambulatory Visit: Payer: Medicare HMO | Attending: Family | Admitting: Family

## 2018-02-11 ENCOUNTER — Encounter: Payer: Self-pay | Admitting: Family

## 2018-02-11 VITALS — BP 114/69 | HR 90 | Resp 20 | Ht 68.0 in

## 2018-02-11 DIAGNOSIS — F419 Anxiety disorder, unspecified: Secondary | ICD-10-CM | POA: Diagnosis not present

## 2018-02-11 DIAGNOSIS — E1122 Type 2 diabetes mellitus with diabetic chronic kidney disease: Secondary | ICD-10-CM | POA: Insufficient documentation

## 2018-02-11 DIAGNOSIS — I5032 Chronic diastolic (congestive) heart failure: Secondary | ICD-10-CM | POA: Insufficient documentation

## 2018-02-11 DIAGNOSIS — Z96651 Presence of right artificial knee joint: Secondary | ICD-10-CM | POA: Diagnosis not present

## 2018-02-11 DIAGNOSIS — R569 Unspecified convulsions: Secondary | ICD-10-CM | POA: Insufficient documentation

## 2018-02-11 DIAGNOSIS — J449 Chronic obstructive pulmonary disease, unspecified: Secondary | ICD-10-CM | POA: Diagnosis not present

## 2018-02-11 DIAGNOSIS — F329 Major depressive disorder, single episode, unspecified: Secondary | ICD-10-CM | POA: Diagnosis not present

## 2018-02-11 DIAGNOSIS — Z79899 Other long term (current) drug therapy: Secondary | ICD-10-CM | POA: Insufficient documentation

## 2018-02-11 DIAGNOSIS — I890 Lymphedema, not elsewhere classified: Secondary | ICD-10-CM | POA: Diagnosis not present

## 2018-02-11 DIAGNOSIS — N183 Chronic kidney disease, stage 3 (moderate): Secondary | ICD-10-CM | POA: Diagnosis not present

## 2018-02-11 DIAGNOSIS — Z7902 Long term (current) use of antithrombotics/antiplatelets: Secondary | ICD-10-CM | POA: Insufficient documentation

## 2018-02-11 DIAGNOSIS — I130 Hypertensive heart and chronic kidney disease with heart failure and stage 1 through stage 4 chronic kidney disease, or unspecified chronic kidney disease: Secondary | ICD-10-CM | POA: Diagnosis not present

## 2018-02-11 DIAGNOSIS — I10 Essential (primary) hypertension: Secondary | ICD-10-CM

## 2018-02-11 DIAGNOSIS — Z8673 Personal history of transient ischemic attack (TIA), and cerebral infarction without residual deficits: Secondary | ICD-10-CM | POA: Diagnosis not present

## 2018-02-11 DIAGNOSIS — K5909 Other constipation: Secondary | ICD-10-CM | POA: Diagnosis not present

## 2018-02-11 DIAGNOSIS — E559 Vitamin D deficiency, unspecified: Secondary | ICD-10-CM | POA: Diagnosis not present

## 2018-02-11 DIAGNOSIS — K219 Gastro-esophageal reflux disease without esophagitis: Secondary | ICD-10-CM | POA: Diagnosis not present

## 2018-02-11 DIAGNOSIS — Z87891 Personal history of nicotine dependence: Secondary | ICD-10-CM | POA: Diagnosis not present

## 2018-02-11 DIAGNOSIS — Z8249 Family history of ischemic heart disease and other diseases of the circulatory system: Secondary | ICD-10-CM | POA: Insufficient documentation

## 2018-02-11 DIAGNOSIS — Z9981 Dependence on supplemental oxygen: Secondary | ICD-10-CM | POA: Diagnosis not present

## 2018-02-11 DIAGNOSIS — Z9049 Acquired absence of other specified parts of digestive tract: Secondary | ICD-10-CM | POA: Diagnosis not present

## 2018-02-11 DIAGNOSIS — R0602 Shortness of breath: Secondary | ICD-10-CM | POA: Diagnosis present

## 2018-02-11 NOTE — Patient Instructions (Signed)
Begin weighing daily and call for an overnight weight gain of > 2 pounds or a weekly weight gain of >5 pounds. 

## 2018-02-11 NOTE — Progress Notes (Signed)
Patient ID: Katherine [name], female    DOB: 10-Nov-1940, 76 y.o.   MRN: [id number]  HPI  Katherine [name] is a 77 y/o female with a history of DM, HTN, CKD, stroke, anxiety, depression, GERD, COPD, seizures, previous tobacco use and chronic heart failure.   Echo report from 10/05/17 reviewed and showed an EF of 60-65%.   Admitted 12/25/17 due to acute COPD exacerbation. Started on prednisone and continue inhalers/nebulizers. CTA was negative for embolus or edema. Discharged the following day back to rehab. Was in the ED 12/24/17 due to a nosebleed where she was treated and released. Was in the ED 12/18/17 due to cystitis where she was treated and released. Admitted twice and in the ED once in October 2019 and same results September 2019.   She presents today for a follow-up visit with a chief complaint of minimal shortness of breath upon moderate exertion. She describes this as chronic in nature having been present for several years. She has associated fatigue, cough, pedal edema, dizziness and bilateral feet pain along with this. She denies any difficulty sleeping, abdominal distention, palpitations, chest pain or weight gain. Says that she's being weighed but doesn't think it's being done daily.   Past Medical History:  Diagnosis Date  . Angioedema   . Anxiety   . Anxiety and depression   . CHF (congestive heart failure) ([place])   . Chronic constipation   . Chronic kidney disease    stage 3  . COPD (chronic obstructive pulmonary disease) ([place])   . Diabetes mellitus without complication ([place])   . Gallstones   . GERD (gastroesophageal reflux disease)   . Hypertension   . Left ventricular hypertrophy   . Osteoarthritis   . Prurigo nodularis   . Seizures ([place])   . Stroke ([place])   . Tobacco abuse   . Vitamin D deficiency disease    Past Surgical History:  Procedure Laterality Date  . CHOLECYSTECTOMY    . 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   Procedure:  TOTAL KNEE ARTHROPLASTY;  Surgeon: Hessie Knows, MD;  Location: [facility] ORS;  Service: Orthopedics;  Laterality: Right;   Family History  Problem Relation Age of Onset  . Alcohol abuse Mother   . Sickle cell anemia Daughter   . Hypertension Son   . Cancer Neg Hx   . COPD Neg Hx   . Diabetes Neg Hx   . Heart disease Neg Hx   . Stroke Neg Hx    Social History   Tobacco Use  . Smoking status: Former Smoker    Packs/day: 0.50    Years: 60.00    Pack years: 30.00    Types: Cigarettes  . Smokeless tobacco: Never Used  Substance Use Topics  . Alcohol use: No    Alcohol/week: 0.0 standard drinks    Comment: rare   Allergies  Allergen Reactions  . Bee Venom Swelling  . Enalapril Maleate Swelling   Prior to Admission medications   Medication Sig Start Date End Date Taking? Authorizing Provider  acetaminophen (TYLENOL) 650 MG CR tablet Take 650 mg by mouth 3 (three) times daily.    Yes [provider]  albuterol (ACCUNEB) 0.63 MG/3ML nebulizer solution Take 3 mLs by nebulization every 8 (eight) hours as needed for wheezing.    Yes [provider]  albuterol (PROVENTIL HFA;VENTOLIN HFA) 108 (90 Base) MCG/ACT inhaler Inhale 2 puffs into the lungs every 4 (four) hours as needed for  wheezing or shortness of breath.   Yes [provider]  atorvastatin (LIPITOR) 80 MG tablet Take 80 mg by mouth every evening.    Yes [provider]  cetirizine (ZYRTEC) 10 MG tablet Take 5 mg by mouth at bedtime.   Yes [provider]  cholecalciferol (VITAMIN D) 1000 units tablet Take 1,000 Units by mouth daily.   Yes [provider]  clopidogrel (PLAVIX) 75 MG tablet Take 75 mg by mouth daily.   Yes [provider]  diclofenac sodium (VOLTAREN) 1 % GEL Apply 2 g topically as needed (for left knee pain).    Yes [provider]  diltiazem (CARDIZEM SR) 120 MG 12 hr capsule Take 120 mg by mouth 2 (two) times daily.   Yes [provider]  docusate sodium (COLACE) 100 MG capsule Take 100 mg by mouth daily.   Yes [provider]  FLUoxetine (PROZAC) 10 MG capsule Take 20 mg by mouth daily.    Yes [provider]  Fluticasone-Umeclidin-Vilant 100-62.5-25 MCG/INH AEPB Inhale 1 puff into the lungs daily.   Yes [provider]  furosemide (LASIX) 20 MG tablet Take 1 tablet (20 mg total) by mouth daily. 11/14/17  Yes Salary, Avel Peace, MD  gabapentin (NEURONTIN) 300 MG capsule Take 300 mg by mouth at bedtime.   Yes [provider]  guaiFENesin (MUCINEX) 600 MG 12 hr tablet Take 1 tablet (600 mg total) by mouth 2 (two) times daily. 11/13/17  Yes Salary, Avel Peace, MD  hydrALAZINE (APRESOLINE) 25 MG tablet Take 25 mg by mouth 3 (three) times daily.   Yes [provider]  ipratropium (ATROVENT) 0.03 % nasal spray Place 2 sprays into both nostrils 2 (two) times daily.    Yes [provider]  ipratropium-albuterol (DUONEB) 0.5-2.5 (3) MG/3ML SOLN Take 3 mLs by nebulization 2 (two) times daily.    Yes [provider]  levETIRAcetam (KEPPRA XR) 500 MG 24 hr tablet Take 1 tablet (500 mg total) by mouth daily. 11/19/17  Yes Venancio Poisson, NP  linagliptin (TRADJENTA) 5 MG TABS tablet Take 5 mg by mouth daily.   Yes [provider]  meclizine (ANTIVERT) 25 MG tablet Take 25 mg by mouth every 12 (twelve) hours as needed for dizziness.    Yes [provider]  Multiple Vitamin (MULTIVITAMIN WITH MINERALS) TABS tablet Take 1 tablet by mouth daily.   Yes [provider]  pantoprazole (PROTONIX) 40 MG tablet Take 40 mg by mouth daily.   Yes [provider]  LORazepam (ATIVAN) 2 MG/ML injection Inject 1 mg into the muscle as needed for seizure. 12/18/17   [provider]  predniSONE (STERAPRED UNI-PAK 21 TAB) 10 MG (21) TBPK tablet 6 tabs day 1 and taper 10 mg a day - 6 days Patient not taking: Reported on 02/11/2018 12/26/17   Hillary Bow, MD   vitamin C (ASCORBIC ACID) 500 MG tablet Take 500 mg by mouth 2 (two) times daily.    [provider]  zinc sulfate 220 (50 Zn) MG capsule Take 220 mg by mouth daily.    [provider]    Review of Systems  Constitutional: Positive for fatigue (sometimes). Negative for appetite change.  HENT: Positive for rhinorrhea. Negative for congestion and sore throat.   Eyes: Negative.   Respiratory: Positive for cough and shortness of breath. Negative for chest tightness.   Cardiovascular: Positive for leg swelling. Negative for chest pain and palpitations.  Gastrointestinal: Negative for abdominal  distention and abdominal pain.  Endocrine: Negative.   Genitourinary: Negative.   Musculoskeletal: Positive for arthralgias (feet hurt). Negative for back pain.  Skin: Negative.   Neurological: Positive for dizziness. Negative for light-headedness.  Hematological: Negative for adenopathy. Does not bruise/bleed easily.  Psychiatric/Behavioral: Negative for dysphoric mood and sleep disturbance (sleeping on 1 pillow). The patient is not nervous/anxious.    Vitals:   02/11/18 1102  BP: 114/69  Pulse: 90  Resp: 20  SpO2: 95%  Height: 5\' 8"  (1.727 m)   Wt Readings from Last 3 Encounters:  12/24/17 281 lb 12 oz (127.8 kg)  12/18/17 281 lb 12 oz (127.8 kg)  12/05/17 274 lb 14.6 oz (124.7 kg)   Lab Results  Component Value Date   CREATININE 1.79 (H) 12/25/2017   CREATININE 1.71 (H) 12/24/2017   CREATININE 1.74 (H) 12/18/2017    Physical Exam Vitals signs and nursing note reviewed.  Constitutional:      Appearance: She is well-developed.  HENT:     Head: Normocephalic and atraumatic.  Neck:     Musculoskeletal: Normal range of motion and neck supple.     Vascular: No JVD.  Cardiovascular:     Rate and Rhythm: Normal rate and regular rhythm.  Pulmonary:     Effort: Pulmonary effort is normal. No respiratory distress.     Breath sounds: No rhonchi.  Abdominal:      General: There is no distension.     Palpations: Abdomen is soft.     Tenderness: There is no abdominal tenderness.  Musculoskeletal:        General: No tenderness.     Right lower leg: She exhibits no tenderness. Edema (2+ pitting) present.     Left lower leg: She exhibits no tenderness. Edema (2+ pitting) present.  Skin:    General: Skin is warm and dry.  Neurological:     Mental Status: She is alert and oriented to person, place, and time.  Psychiatric:        Behavior: Behavior normal.        Thought Content: Thought content normal.     Assessment & Plan:  1: Chronic heart failure with preserved ejection fraction- - NYHA class II - euvolemic today - still at facility and says that she's being weighed but not daily; order written for her to be weighed daily and to call for an overnight weight gain of >2 pounds or a weekly weight gain of >5 pounds - weight not obtained today due to patient's inability to safely stand - not adding salt  - BNP 11/29/17 was 153.0 - patient reports receiving her flu vaccine for this season - since patient remains in the facility she would not qualify for paramedic program at this time  2: HTN- - BP looks good today - sees PCP at facility; normally sees [facility] at [facility] from 12/25/17 reviewed and showed sodium 142, potassium 3.7, creatinine 1.79 and GFR 31  3: COPD- - wearing oxygen at 2L around the clock - may benefit from pulmonology referral  4: Lymphedema- - stage 2 - limited in her ability to exercise due to weight and shortness of breath - hasn't been wearing compression socks so order was written for her to have compression socks placed every morning with removal at bedtime - not elevating legs and she was encouraged to elevate her legs when sitting for long periods of time - consider lymphapress compression boots if edema persists  Facility medication list  was reviewed. Husband is concerned that patient  is on too many medications, encouraged him to speak with the provider at the facility to ask for a medication review.   Return in 3 months or sooner for any questions/problems before then.

## 2018-02-12 ENCOUNTER — Encounter: Payer: Self-pay | Admitting: Family

## 2018-02-15 DIAGNOSIS — R05 Cough: Secondary | ICD-10-CM | POA: Diagnosis not present

## 2018-02-15 DIAGNOSIS — J189 Pneumonia, unspecified organism: Secondary | ICD-10-CM | POA: Diagnosis not present

## 2018-02-15 DIAGNOSIS — J9611 Chronic respiratory failure with hypoxia: Secondary | ICD-10-CM | POA: Diagnosis not present

## 2018-02-15 DIAGNOSIS — J449 Chronic obstructive pulmonary disease, unspecified: Secondary | ICD-10-CM | POA: Diagnosis not present

## 2018-02-15 DIAGNOSIS — I509 Heart failure, unspecified: Secondary | ICD-10-CM | POA: Diagnosis not present

## 2018-02-21 ENCOUNTER — Ambulatory Visit: Payer: Medicare HMO | Admitting: Adult Health

## 2018-02-24 ENCOUNTER — Emergency Department: Payer: Medicare HMO

## 2018-02-24 ENCOUNTER — Inpatient Hospital Stay
Admission: EM | Admit: 2018-02-24 | Discharge: 2018-02-28 | DRG: 189 | Disposition: A | Discharge status: Skilled Nursing Facility | Payer: Medicare HMO | Attending: Internal Medicine | Admitting: Internal Medicine

## 2018-02-24 DIAGNOSIS — I5033 Acute on chronic diastolic (congestive) heart failure: Secondary | ICD-10-CM | POA: Diagnosis present

## 2018-02-24 DIAGNOSIS — J189 Pneumonia, unspecified organism: Secondary | ICD-10-CM

## 2018-02-24 DIAGNOSIS — M2550 Pain in unspecified joint: Secondary | ICD-10-CM | POA: Diagnosis not present

## 2018-02-24 DIAGNOSIS — Z91030 Bee allergy status: Secondary | ICD-10-CM

## 2018-02-24 DIAGNOSIS — E119 Type 2 diabetes mellitus without complications: Secondary | ICD-10-CM | POA: Diagnosis not present

## 2018-02-24 DIAGNOSIS — J9621 Acute and chronic respiratory failure with hypoxia: Principal | ICD-10-CM | POA: Diagnosis present

## 2018-02-24 DIAGNOSIS — Z8249 Family history of ischemic heart disease and other diseases of the circulatory system: Secondary | ICD-10-CM

## 2018-02-24 DIAGNOSIS — Z0189 Encounter for other specified special examinations: Secondary | ICD-10-CM

## 2018-02-24 DIAGNOSIS — I509 Heart failure, unspecified: Secondary | ICD-10-CM | POA: Diagnosis not present

## 2018-02-24 DIAGNOSIS — Z79899 Other long term (current) drug therapy: Secondary | ICD-10-CM

## 2018-02-24 DIAGNOSIS — I130 Hypertensive heart and chronic kidney disease with heart failure and stage 1 through stage 4 chronic kidney disease, or unspecified chronic kidney disease: Secondary | ICD-10-CM | POA: Diagnosis present

## 2018-02-24 DIAGNOSIS — Z9981 Dependence on supplemental oxygen: Secondary | ICD-10-CM | POA: Diagnosis not present

## 2018-02-24 DIAGNOSIS — R6520 Severe sepsis without septic shock: Secondary | ICD-10-CM

## 2018-02-24 DIAGNOSIS — K219 Gastro-esophageal reflux disease without esophagitis: Secondary | ICD-10-CM | POA: Diagnosis present

## 2018-02-24 DIAGNOSIS — Z6839 Body mass index (BMI) 39.0-39.9, adult: Secondary | ICD-10-CM

## 2018-02-24 DIAGNOSIS — R0689 Other abnormalities of breathing: Secondary | ICD-10-CM | POA: Diagnosis not present

## 2018-02-24 DIAGNOSIS — E559 Vitamin D deficiency, unspecified: Secondary | ICD-10-CM | POA: Diagnosis present

## 2018-02-24 DIAGNOSIS — A419 Sepsis, unspecified organism: Secondary | ICD-10-CM | POA: Diagnosis not present

## 2018-02-24 DIAGNOSIS — J9622 Acute and chronic respiratory failure with hypercapnia: Secondary | ICD-10-CM | POA: Diagnosis not present

## 2018-02-24 DIAGNOSIS — Z87891 Personal history of nicotine dependence: Secondary | ICD-10-CM

## 2018-02-24 DIAGNOSIS — R569 Unspecified convulsions: Secondary | ICD-10-CM | POA: Diagnosis present

## 2018-02-24 DIAGNOSIS — R42 Dizziness and giddiness: Secondary | ICD-10-CM | POA: Diagnosis present

## 2018-02-24 DIAGNOSIS — R0789 Other chest pain: Secondary | ICD-10-CM | POA: Diagnosis not present

## 2018-02-24 DIAGNOSIS — I110 Hypertensive heart disease with heart failure: Secondary | ICD-10-CM | POA: Diagnosis not present

## 2018-02-24 DIAGNOSIS — N183 Chronic kidney disease, stage 3 (moderate): Secondary | ICD-10-CM | POA: Diagnosis present

## 2018-02-24 DIAGNOSIS — E1122 Type 2 diabetes mellitus with diabetic chronic kidney disease: Secondary | ICD-10-CM | POA: Diagnosis not present

## 2018-02-24 DIAGNOSIS — E662 Morbid (severe) obesity with alveolar hypoventilation: Secondary | ICD-10-CM | POA: Diagnosis present

## 2018-02-24 DIAGNOSIS — L281 Prurigo nodularis: Secondary | ICD-10-CM | POA: Diagnosis present

## 2018-02-24 DIAGNOSIS — K5909 Other constipation: Secondary | ICD-10-CM | POA: Diagnosis present

## 2018-02-24 DIAGNOSIS — Z96651 Presence of right artificial knee joint: Secondary | ICD-10-CM | POA: Diagnosis present

## 2018-02-24 DIAGNOSIS — Z7951 Long term (current) use of inhaled steroids: Secondary | ICD-10-CM | POA: Diagnosis not present

## 2018-02-24 DIAGNOSIS — M1990 Unspecified osteoarthritis, unspecified site: Secondary | ICD-10-CM | POA: Diagnosis present

## 2018-02-24 DIAGNOSIS — I672 Cerebral atherosclerosis: Secondary | ICD-10-CM | POA: Diagnosis not present

## 2018-02-24 DIAGNOSIS — F419 Anxiety disorder, unspecified: Secondary | ICD-10-CM | POA: Diagnosis present

## 2018-02-24 DIAGNOSIS — R0902 Hypoxemia: Secondary | ICD-10-CM | POA: Diagnosis not present

## 2018-02-24 DIAGNOSIS — Z7902 Long term (current) use of antithrombotics/antiplatelets: Secondary | ICD-10-CM | POA: Diagnosis not present

## 2018-02-24 DIAGNOSIS — Z888 Allergy status to other drugs, medicaments and biological substances status: Secondary | ICD-10-CM

## 2018-02-24 DIAGNOSIS — J441 Chronic obstructive pulmonary disease with (acute) exacerbation: Secondary | ICD-10-CM | POA: Diagnosis present

## 2018-02-24 DIAGNOSIS — Z8673 Personal history of transient ischemic attack (TIA), and cerebral infarction without residual deficits: Secondary | ICD-10-CM | POA: Diagnosis not present

## 2018-02-24 DIAGNOSIS — Z7401 Bed confinement status: Secondary | ICD-10-CM | POA: Diagnosis not present

## 2018-02-24 DIAGNOSIS — F329 Major depressive disorder, single episode, unspecified: Secondary | ICD-10-CM | POA: Diagnosis present

## 2018-02-24 DIAGNOSIS — R079 Chest pain, unspecified: Secondary | ICD-10-CM

## 2018-02-24 DIAGNOSIS — J9601 Acute respiratory failure with hypoxia: Secondary | ICD-10-CM | POA: Diagnosis not present

## 2018-02-24 HISTORY — DX: Chronic obstructive pulmonary disease with (acute) exacerbation: J44.1

## 2018-02-24 LAB — CBC
HEMATOCRIT: 32.1 % — AB (ref 36.0–46.0)
HEMOGLOBIN: 9.8 g/dL — AB (ref 12.0–15.0)
MCH: 26.4 pg (ref 26.0–34.0)
MCHC: 30.5 g/dL (ref 30.0–36.0)
MCV: 86.5 fL (ref 80.0–100.0)
NRBC: 0 % (ref 0.0–0.2)
Platelets: 228 10*3/uL (ref 150–400)
RBC: 3.71 MIL/uL — AB (ref 3.87–5.11)
RDW: 18.5 % — ABNORMAL HIGH (ref 11.5–15.5)
WBC: 10.2 10*3/uL (ref 4.0–10.5)

## 2018-02-24 LAB — GLUCOSE, CAPILLARY: Glucose-Capillary: 115 mg/dL — ABNORMAL HIGH (ref 70–99)

## 2018-02-24 LAB — FIBRIN DERIVATIVES D-DIMER (ARMC ONLY): Fibrin derivatives D-dimer (ARMC): 2986.51 ng/mL (FEU) — ABNORMAL HIGH (ref 0.00–499.00)

## 2018-02-24 LAB — BASIC METABOLIC PANEL
ANION GAP: 9 (ref 5–15)
BUN: 22 mg/dL (ref 8–23)
CHLORIDE: 98 mmol/L (ref 98–111)
CO2: 31 mmol/L (ref 22–32)
Calcium: 8.9 mg/dL (ref 8.9–10.3)
Creatinine, Ser: 1.97 mg/dL — ABNORMAL HIGH (ref 0.44–1.00)
GFR calc Af Amer: 28 mL/min — ABNORMAL LOW (ref >60)
GFR, EST NON AFRICAN AMERICAN: 24 mL/min — AB (ref >60)
Glucose, Bld: 121 mg/dL — ABNORMAL HIGH (ref 70–99)
POTASSIUM: 4 mmol/L (ref 3.5–5.1)
SODIUM: 138 mmol/L (ref 135–145)

## 2018-02-24 LAB — INFLUENZA PANEL BY PCR (TYPE A & B)
INFLBPCR: NEGATIVE
Influenza A By PCR: NEGATIVE

## 2018-02-24 LAB — CG4 I-STAT (LACTIC ACID): Lactic Acid, Venous: 1.64 mmol/L (ref 0.5–1.9)

## 2018-02-24 LAB — TROPONIN I: Troponin I: <0.03 ng/mL (ref <0.03)

## 2018-02-24 LAB — PROCALCITONIN: Procalcitonin: 0.11 ng/mL

## 2018-02-24 LAB — BRAIN NATRIURETIC PEPTIDE: B Natriuretic Peptide: 78 pg/mL (ref 0.0–100.0)

## 2018-02-24 MED ORDER — VITAMIN D3 25 MCG (1000 UNIT) PO TABS
1000.0000 [IU] | ORAL_TABLET | Freq: Every day | ORAL | Status: DC
  Administered 2018-02-25 – 2018-02-28 (×4): 1000 [IU] via ORAL
  Filled 2018-02-24 (×5): qty 1

## 2018-02-24 MED ORDER — INSULIN ASPART 100 UNIT/ML ~~LOC~~ SOLN
0.0000 [IU] | Freq: Three times a day (TID) | SUBCUTANEOUS | Status: DC
  Administered 2018-02-25 (×3): 3 [IU] via SUBCUTANEOUS
  Administered 2018-02-26: 5 [IU] via SUBCUTANEOUS
  Administered 2018-02-26 (×2): 2 [IU] via SUBCUTANEOUS
  Administered 2018-02-27: 1 [IU] via SUBCUTANEOUS
  Administered 2018-02-27: 2 [IU] via SUBCUTANEOUS
  Administered 2018-02-27: 3 [IU] via SUBCUTANEOUS
  Administered 2018-02-28: 1 [IU] via SUBCUTANEOUS
  Filled 2018-02-24 (×10): qty 1

## 2018-02-24 MED ORDER — ONDANSETRON HCL 4 MG PO TABS: 4.0000 mg | ORAL_TABLET | Freq: Four times a day (QID) | ORAL | Status: DC | PRN

## 2018-02-24 MED ORDER — BUDESONIDE 0.25 MG/2ML IN SUSP
0.2500 mg | Freq: Two times a day (BID) | RESPIRATORY_TRACT | Status: DC
  Administered 2018-02-25: 0.25 mg via RESPIRATORY_TRACT
  Filled 2018-02-24: qty 2

## 2018-02-24 MED ORDER — IPRATROPIUM BROMIDE 0.03 % NA SOLN
2.0000 | Freq: Two times a day (BID) | NASAL | Status: DC
  Administered 2018-02-25 – 2018-02-28 (×5): 2 via NASAL
  Filled 2018-02-24: qty 30

## 2018-02-24 MED ORDER — LINAGLIPTIN 5 MG PO TABS
5.0000 mg | ORAL_TABLET | Freq: Every day | ORAL | Status: DC
  Administered 2018-02-25 – 2018-02-28 (×4): 5 mg via ORAL
  Filled 2018-02-24 (×5): qty 1

## 2018-02-24 MED ORDER — IPRATROPIUM-ALBUTEROL 0.5-2.5 (3) MG/3ML IN SOLN
3.0000 mL | RESPIRATORY_TRACT | Status: DC
  Administered 2018-02-25 – 2018-02-26 (×8): 3 mL via RESPIRATORY_TRACT
  Filled 2018-02-24 (×8): qty 3

## 2018-02-24 MED ORDER — FUROSEMIDE 10 MG/ML IJ SOLN
20.0000 mg | Freq: Once | INTRAMUSCULAR | Status: AC
  Administered 2018-02-24: 20 mg via INTRAVENOUS
  Filled 2018-02-24: qty 4

## 2018-02-24 MED ORDER — FLUOXETINE HCL 20 MG PO CAPS
20.0000 mg | ORAL_CAPSULE | Freq: Every day | ORAL | Status: DC
  Administered 2018-02-25 – 2018-02-28 (×4): 20 mg via ORAL
  Filled 2018-02-24 (×5): qty 1

## 2018-02-24 MED ORDER — METHYLPREDNISOLONE SODIUM SUCC 125 MG IJ SOLR
60.0000 mg | Freq: Four times a day (QID) | INTRAMUSCULAR | Status: DC
  Administered 2018-02-25 – 2018-02-26 (×6): 60 mg via INTRAVENOUS
  Filled 2018-02-24 (×6): qty 2

## 2018-02-24 MED ORDER — SODIUM CHLORIDE 0.9 % IV SOLN: 250.0000 mL | INTRAVENOUS | Status: DC | PRN

## 2018-02-24 MED ORDER — HYDRALAZINE HCL 25 MG PO TABS
25.0000 mg | ORAL_TABLET | Freq: Three times a day (TID) | ORAL | Status: DC
  Administered 2018-02-25 – 2018-02-28 (×7): 25 mg via ORAL
  Filled 2018-02-24 (×8): qty 1

## 2018-02-24 MED ORDER — ACETAMINOPHEN 650 MG RE SUPP: 650.0000 mg | Freq: Four times a day (QID) | RECTAL | Status: DC | PRN

## 2018-02-24 MED ORDER — GABAPENTIN 300 MG PO CAPS
300.0000 mg | ORAL_CAPSULE | Freq: Every day | ORAL | Status: DC
  Administered 2018-02-25 – 2018-02-27 (×3): 300 mg via ORAL
  Filled 2018-02-24 (×3): qty 1

## 2018-02-24 MED ORDER — PANTOPRAZOLE SODIUM 40 MG PO TBEC
40.0000 mg | DELAYED_RELEASE_TABLET | Freq: Every day | ORAL | Status: DC
  Administered 2018-02-25 – 2018-02-28 (×4): 40 mg via ORAL
  Filled 2018-02-24 (×4): qty 1

## 2018-02-24 MED ORDER — DILTIAZEM HCL ER 60 MG PO CP12
120.0000 mg | ORAL_CAPSULE | Freq: Two times a day (BID) | ORAL | Status: DC
  Administered 2018-02-25 – 2018-02-28 (×7): 120 mg via ORAL
  Filled 2018-02-24 (×9): qty 2

## 2018-02-24 MED ORDER — SODIUM CHLORIDE 0.9 % IV SOLN
100.0000 mg | Freq: Two times a day (BID) | INTRAVENOUS | Status: DC
  Administered 2018-02-25: 100 mg via INTRAVENOUS
  Filled 2018-02-24 (×4): qty 100

## 2018-02-24 MED ORDER — VANCOMYCIN HCL IN DEXTROSE 1-5 GM/200ML-% IV SOLN: 1000.0000 mg | Freq: Once | INTRAVENOUS | Status: DC

## 2018-02-24 MED ORDER — ONDANSETRON HCL 4 MG/2ML IJ SOLN
4.0000 mg | Freq: Four times a day (QID) | INTRAMUSCULAR | Status: DC | PRN
  Administered 2018-02-28: 4 mg via INTRAVENOUS
  Filled 2018-02-24: qty 2

## 2018-02-24 MED ORDER — IPRATROPIUM-ALBUTEROL 0.5-2.5 (3) MG/3ML IN SOLN
3.0000 mL | Freq: Once | RESPIRATORY_TRACT | Status: AC
  Administered 2018-02-24: 3 mL via RESPIRATORY_TRACT
  Filled 2018-02-24: qty 3

## 2018-02-24 MED ORDER — ENOXAPARIN SODIUM 40 MG/0.4ML ~~LOC~~ SOLN
40.0000 mg | Freq: Two times a day (BID) | SUBCUTANEOUS | Status: DC
  Administered 2018-02-25 – 2018-02-28 (×7): 40 mg via SUBCUTANEOUS
  Filled 2018-02-24 (×7): qty 0.4

## 2018-02-24 MED ORDER — CLOPIDOGREL BISULFATE 75 MG PO TABS
75.0000 mg | ORAL_TABLET | Freq: Every day | ORAL | Status: DC
  Administered 2018-02-25 – 2018-02-28 (×4): 75 mg via ORAL
  Filled 2018-02-24 (×4): qty 1

## 2018-02-24 MED ORDER — SODIUM CHLORIDE 0.9% FLUSH: 3.0000 mL | INTRAVENOUS | Status: DC | PRN

## 2018-02-24 MED ORDER — SODIUM CHLORIDE 0.9 % IV SOLN: 1.0000 g | Freq: Once | INTRAVENOUS | Status: DC

## 2018-02-24 MED ORDER — SODIUM CHLORIDE 0.9% FLUSH
3.0000 mL | Freq: Two times a day (BID) | INTRAVENOUS | Status: DC
  Administered 2018-02-25 – 2018-02-28 (×7): 3 mL via INTRAVENOUS

## 2018-02-24 MED ORDER — GUAIFENESIN ER 600 MG PO TB12: 600.0000 mg | ORAL_TABLET | Freq: Two times a day (BID) | ORAL | Status: DC

## 2018-02-24 MED ORDER — METHYLPREDNISOLONE SODIUM SUCC 125 MG IJ SOLR
125.0000 mg | Freq: Once | INTRAMUSCULAR | Status: AC
  Administered 2018-02-24: 125 mg via INTRAVENOUS
  Filled 2018-02-24: qty 2

## 2018-02-24 MED ORDER — LEVETIRACETAM ER 500 MG PO TB24
500.0000 mg | ORAL_TABLET | Freq: Every day | ORAL | Status: DC
  Administered 2018-02-25 – 2018-02-28 (×4): 500 mg via ORAL
  Filled 2018-02-24 (×4): qty 1

## 2018-02-24 MED ORDER — ATORVASTATIN CALCIUM 20 MG PO TABS
80.0000 mg | ORAL_TABLET | Freq: Every evening | ORAL | Status: DC
  Administered 2018-02-25 – 2018-02-27 (×3): 80 mg via ORAL
  Filled 2018-02-24 (×3): qty 4

## 2018-02-24 MED ORDER — GUAIFENESIN ER 600 MG PO TB12
600.0000 mg | ORAL_TABLET | Freq: Two times a day (BID) | ORAL | Status: DC
  Administered 2018-02-25 – 2018-02-28 (×7): 600 mg via ORAL
  Filled 2018-02-24 (×7): qty 1

## 2018-02-24 MED ORDER — LORATADINE 10 MG PO TABS
10.0000 mg | ORAL_TABLET | Freq: Every day | ORAL | Status: DC
  Administered 2018-02-25 – 2018-02-28 (×4): 10 mg via ORAL
  Filled 2018-02-24 (×4): qty 1

## 2018-02-24 MED ORDER — ACETAMINOPHEN 325 MG PO TABS
650.0000 mg | ORAL_TABLET | Freq: Four times a day (QID) | ORAL | Status: DC | PRN
  Administered 2018-02-27: 650 mg via ORAL
  Filled 2018-02-24: qty 2

## 2018-02-24 MED ORDER — FUROSEMIDE 20 MG PO TABS
20.0000 mg | ORAL_TABLET | Freq: Every day | ORAL | Status: DC
  Administered 2018-02-25 – 2018-02-28 (×4): 20 mg via ORAL
  Filled 2018-02-24 (×4): qty 1

## 2018-02-24 MED ORDER — LORAZEPAM 2 MG/ML IJ SOLN: 1.0000 mg | INTRAMUSCULAR | Status: DC | PRN

## 2018-02-24 MED ORDER — ACETAMINOPHEN 325 MG PO TABS
650.0000 mg | ORAL_TABLET | Freq: Three times a day (TID) | ORAL | Status: DC
  Administered 2018-02-25 – 2018-02-28 (×10): 650 mg via ORAL
  Filled 2018-02-24 (×10): qty 2

## 2018-02-24 NOTE — ED Notes (Signed)
X-ray at bedside

## 2018-02-24 NOTE — ED Notes (Signed)
RT called to place pt on bipap. Verbal order from Dr. Alfred Levins.

## 2018-02-24 NOTE — H&P (Signed)
[place] [facility] [place] NAME: Katherine [name]    MR#:  [id number]  DATE OF BIRTH:  1940-10-18  DATE OF ADMISSION:  02/24/2018  PRIMARY CARE PHYSICIAN: Valerie Roys, DO   REQUESTING/REFERRING PHYSICIAN: Gonzella Lex MD CHIEF COMPLAINT:   Chief Complaint  Patient presents with  . Chest Pain    HISTORY OF PRESENT ILLNESS: Katherine [name]  is a 78 y.o. female with a known history of COPD on 2 L of oxygen, congestive heart failure, chronic kidney disease stage III, diabetes type 2, GERD, hypertension, osteoarthritis, previous history of stroke who is presenting from nursing home with shortness of breath.  Patient apparently was diagnosed with pneumonia and started on antibiotics at the facility.  Finished course of antibiotics yesterday.  Patient stated that her breathing was worse today therefore came to the emergency room.  Had to be placed on BiPAP.  Patient's chest x-ray here is negative.  PAST MEDICAL HISTORY:   Past Medical History:  Diagnosis Date  . Angioedema   . Anxiety   . Anxiety and depression   . CHF (congestive heart failure) ([place])   . Chronic constipation   . Chronic kidney disease    stage 3  . COPD (chronic obstructive pulmonary disease) ([place])   . Diabetes mellitus without complication ([place])   . Gallstones   . GERD (gastroesophageal reflux disease)   . Hypertension   . Left ventricular hypertrophy   . Osteoarthritis   . Prurigo nodularis   . Seizures ([place])   . Stroke ([place])   . Tobacco abuse   . Vitamin D deficiency disease     PAST SURGICAL HISTORY:  Past Surgical History:  Procedure Laterality Date  . CHOLECYSTECTOMY    . 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   Procedure: TOTAL KNEE ARTHROPLASTY;  Surgeon: Hessie Knows, MD;  Location: [facility] ORS;  Service: Orthopedics;  Laterality: Right;    SOCIAL HISTORY:  Social History   Tobacco Use  . Smoking status: Former  Smoker    Packs/day: 0.50    Years: 60.00    Pack years: 30.00    Types: Cigarettes  . Smokeless tobacco: Never Used  Substance Use Topics  . Alcohol use: No    Alcohol/week: 0.0 standard drinks    Comment: rare    FAMILY HISTORY:  Family History  Problem Relation Age of Onset  . Alcohol abuse Mother   . Sickle cell anemia Daughter   . Hypertension Son   . Cancer Neg Hx   . COPD Neg Hx   . Diabetes Neg Hx   . Heart disease Neg Hx   . Stroke Neg Hx     DRUG ALLERGIES:  Allergies  Allergen Reactions  . Bee Venom Swelling  . Enalapril Maleate Swelling    REVIEW OF SYSTEMS:   CONSTITUTIONAL: No fever, fatigue or weakness.  EYES: No blurred or double vision.  EARS, NOSE, AND THROAT: No tinnitus or ear pain.  RESPIRATORY: Positive cough, positive shortness of breath, positive wheezing or hemoptysis.  CARDIOVASCULAR: No chest pain, orthopnea, edema.  GASTROINTESTINAL: No nausea, vomiting, diarrhea or abdominal pain.  GENITOURINARY: No dysuria, hematuria.  ENDOCRINE: No polyuria, nocturia,  HEMATOLOGY: No anemia, easy bruising or bleeding SKIN: No rash or lesion. MUSCULOSKELETAL: No joint pain or arthritis.   NEUROLOGIC: No tingling, numbness, weakness.  PSYCHIATRY: No anxiety or depression.   MEDICATIONS AT HOME:  Prior to Admission medications  Medication Sig Start Date End Date Taking? Authorizing Provider  acetaminophen (TYLENOL) 650 MG CR tablet Take 650 mg by mouth 3 (three) times daily.     [provider]  albuterol (ACCUNEB) 0.63 MG/3ML nebulizer solution Take 3 mLs by nebulization every 8 (eight) hours as needed for wheezing.     [provider]  albuterol (PROVENTIL HFA;VENTOLIN HFA) 108 (90 Base) MCG/ACT inhaler Inhale 2 puffs into the lungs every 4 (four) hours as needed for wheezing or shortness of breath.    [provider]  atorvastatin (LIPITOR) 80 MG tablet Take 80 mg by mouth every evening.     [provider]   cetirizine (ZYRTEC) 10 MG tablet Take 5 mg by mouth at bedtime.    [provider]  cholecalciferol (VITAMIN D) 1000 units tablet Take 1,000 Units by mouth daily.    [provider]  clopidogrel (PLAVIX) 75 MG tablet Take 75 mg by mouth daily.    [provider]  diclofenac sodium (VOLTAREN) 1 % GEL Apply 2 g topically as needed (for left knee pain).     [provider]  diltiazem (CARDIZEM SR) 120 MG 12 hr capsule Take 120 mg by mouth 2 (two) times daily.    [provider]  docusate sodium (COLACE) 100 MG capsule Take 100 mg by mouth daily.    [provider]  FLUoxetine (PROZAC) 10 MG capsule Take 20 mg by mouth daily.     [provider]  Fluticasone-Umeclidin-Vilant 100-62.5-25 MCG/INH AEPB Inhale 1 puff into the lungs daily.    [provider]  furosemide (LASIX) 20 MG tablet Take 1 tablet (20 mg total) by mouth daily. 11/14/17   Salary, Avel Peace, MD  gabapentin (NEURONTIN) 300 MG capsule Take 300 mg by mouth at bedtime.    [provider]  guaiFENesin (MUCINEX) 600 MG 12 hr tablet Take 1 tablet (600 mg total) by mouth 2 (two) times daily. 11/13/17   Salary, Avel Peace, MD  hydrALAZINE (APRESOLINE) 25 MG tablet Take 25 mg by mouth 3 (three) times daily.    [provider]  ipratropium (ATROVENT) 0.03 % nasal spray Place 2 sprays into both nostrils 2 (two) times daily.     [provider]  ipratropium-albuterol (DUONEB) 0.5-2.5 (3) MG/3ML SOLN Take 3 mLs by nebulization 2 (two) times daily.     [provider]  levETIRAcetam (KEPPRA XR) 500 MG 24 hr tablet Take 1 tablet (500 mg total) by mouth daily. 11/19/17   Venancio Poisson, NP  linagliptin (TRADJENTA) 5 MG TABS tablet Take 5 mg by mouth daily.    [provider]  LORazepam (ATIVAN) 2 MG/ML injection Inject 1 mg into the muscle as needed for seizure. 12/18/17   [provider]  meclizine (ANTIVERT) 25 MG tablet Take  25 mg by mouth every 12 (twelve) hours as needed for dizziness.     [provider]  Multiple Vitamin (MULTIVITAMIN WITH MINERALS) TABS tablet Take 1 tablet by mouth daily.    [provider]  pantoprazole (PROTONIX) 40 MG tablet Take 40 mg by mouth daily.    [provider]  predniSONE (STERAPRED UNI-PAK 21 TAB) 10 MG (21) TBPK tablet 6 tabs day 1 and taper 10 mg a day - 6 days Patient not taking: Reported on 02/11/2018 12/26/17   Hillary Bow, MD  vitamin C (ASCORBIC ACID) 500 MG tablet Take 500 mg by mouth 2 (two) times daily.    [provider]  zinc sulfate 220 (50 Zn) MG capsule Take 220 mg by mouth daily.    [provider]      PHYSICAL EXAMINATION:   VITAL SIGNS: Blood pressure (!) 125/108, pulse (!) 112, temperature 99.1 F (37.3 C), temperature source Oral, resp. rate (!) 33, height 5\' 8"  (1.727 m), weight (!) 145.2 kg, SpO2 90 %.  GENERAL:  78 y.o.-year-old patient lying in the bed with no acute distress.  EYES: Pupils equal, round, reactive to light and accommodation. No scleral icterus. Extraocular muscles intact.  HEENT: Head atraumatic, normocephalic. Oropharynx and nasopharynx clear.  NECK:  Supple, no jugular venous distention. No thyroid enlargement, no tenderness.  LUNGS: Bilateral wheezing throughout both lung.  CARDIOVASCULAR: S1, S2 normal. No murmurs, rubs, or gallops.  ABDOMEN: Soft, nontender, nondistended. Bowel sounds present. No organomegaly or mass.  EXTREMITIES: Nonpitting pedal edema, cyanosis, or clubbing.  NEUROLOGIC: Cranial nerves II through XII are intact. Muscle strength 5/5 in all extremities. Sensation intact. Gait not checked.  PSYCHIATRIC: The patient is alert and oriented x 3.  SKIN: No obvious rash, lesion, or ulcer.   LABORATORY PANEL:   CBC Recent Labs  Lab 02/24/18 1837  WBC 10.2  HGB 9.8*  HCT 32.1*  PLT 228  MCV 86.5  MCH 26.4  MCHC 30.5  RDW 18.5*    ------------------------------------------------------------------------------------------------------------------  Chemistries  Recent Labs  Lab 02/24/18 1837  NA 138  K 4.0  CL 98  CO2 31  GLUCOSE 121*  BUN 22  CREATININE 1.97*  CALCIUM 8.9   ------------------------------------------------------------------------------------------------------------------ estimated creatinine clearance is 37 mL/min (A) (by C-G formula based on SCr of 1.97 mg/dL (H)). ------------------------------------------------------------------------------------------------------------------ No results for input(s): TSH, T4TOTAL, T3FREE, THYROIDAB in the last 72 hours.  Invalid input(s): FREET3   Coagulation profile No results for input(s): INR, PROTIME in the last 168 hours. ------------------------------------------------------------------------------------------------------------------- No results for input(s): DDIMER in the last 72 hours. -------------------------------------------------------------------------------------------------------------------  Cardiac Enzymes Recent Labs  Lab 02/24/18 1837  TROPONINI <0.03   ------------------------------------------------------------------------------------------------------------------ Invalid input(s): POCBNP  ---------------------------------------------------------------------------------------------------------------  Urinalysis    Component Value Date/Time   COLORURINE YELLOW (A) 12/18/2017 1953   APPEARANCEUR HAZY (A) 12/18/2017 1953   APPEARANCEUR Clear 02/20/2013 1459   LABSPEC 1.009 12/18/2017 1953   LABSPEC 1.012 02/20/2013 1459   PHURINE 5.0 12/18/2017 1953   GLUCOSEU NEGATIVE 12/18/2017 1953   GLUCOSEU Negative 02/20/2013 1459   HGBUR NEGATIVE 12/18/2017 1953   BILIRUBINUR NEGATIVE 12/18/2017 1953   BILIRUBINUR Negative 02/20/2013 1459   [place] 12/18/2017 1953   PROTEINUR NEGATIVE 12/18/2017 1953   NITRITE  NEGATIVE 12/18/2017 1953   LEUKOCYTESUR LARGE (A) 12/18/2017 1953   LEUKOCYTESUR Negative 02/20/2013 1459     RADIOLOGY: Dg Chest Port 1 View  Result Date: 02/24/2018 CLINICAL DATA:  Chest pain. Increasing oxygen requirement. Left lung pneumonia 6 days ago. EXAM: PORTABLE CHEST 1 VIEW COMPARISON:  12/26/2017 FINDINGS: Shallow inspiration with linear atelectasis in the lung bases. Cardiac enlargement. No vascular congestion, edema, or consolidation. No blunting of costophrenic angles. No pneumothorax. Mediastinal contours appear intact. Calcification of the aorta. IMPRESSION: Shallow inspiration with linear atelectasis in the lung bases. Cardiac enlargement. Electronically Signed   By: Lucienne Capers M.D.   On: 02/24/2018 19:48    EKG: Orders placed or performed during the hospital encounter of 02/24/18  . EKG 12-Lead  . EKG 12-Lead  . EKG 12-Lead  . EKG 12-Lead  . ED EKG within 10 minutes  . ED EKG within 10 minutes    IMPRESSION AND PLAN:  Patient a morbidly obese African-American female with chronic respiratory failure presenting with shortness of breath  1.  Acute on chronic respiratory failure due to acute on chronic COPD exasperation We will place patient on IV Solu-Medrol Continue BiPAP Continue nebulizer therapy I will place patient on Pulmicort nebs We will place her on IV doxycycline Check procalcitonin's x-ray is currently negative I have let the E link physician know of patient's admission  2.  History of congestive heart failure currently compensated continue oral Lasix  3.  Diabetes type 2 Place her on sliding scale insulin monitor blood sugars  4.  Previous history of CVA continue Plavix  5.  Hypertension continue hydralazine  6.  History of seizure continue Keppra  7.  GERD continue Protonix     All the records are reviewed and case discussed with ED provider. Management plans discussed with the patient, family and they are in agreement.  CODE  STATUS: Code Status History    Date Active Date Inactive Code Status Order ID Comments User Context   12/25/2017 1653 12/26/2017 2152 Full Code [id number]  Gorden Harms, MD Inpatient   11/29/2017 2119 12/05/2017 1841 Full Code [id number]  Gorden Harms, MD Inpatient   11/22/2017 0408 11/26/2017 1804 Full Code [id number]  Arta Silence, MD Inpatient   11/10/2017 1840 11/13/2017 1653 Full Code [id number]  Sela Hua, MD Inpatient   10/24/2017 0247 10/29/2017 2301 Full Code [id number]  Lance Coon, MD Inpatient   10/04/2017 1005 10/10/2017 1813 Full Code [id number]  Marliss Coots, PA-C Inpatient   10/01/2017 0819 10/04/2017 0904 Full Code [id number]  Lolita Cram, RN Inpatient   09/19/2017 1306 09/22/2017 1622 Full Code [id number]  Gorden Harms, MD Inpatient   09/11/2017 2021 09/15/2017 1510 Full Code [id number]  Gorden Harms, MD Inpatient   08/27/2017 1543 08/29/2017 2159 Full Code [id number]  Hillary Bow, MD ED   06/28/2017 1414 06/30/2017 1748 Full Code [id number]  Gorden Harms, MD Inpatient   06/12/2017 0858 06/16/2017 1659 DNR [id number]  Demetrios Loll, MD Inpatient   05/26/2017 1732 05/30/2017 1446 DNR [id number]  Hillary Bow, MD ED   05/17/2017 1228 05/21/2017 1511 DNR [id number]  Asencion Gowda, NP Inpatient   04/21/2017 0117 04/22/2017 1750 DNR [id number]  Lance Coon, MD Inpatient   04/05/2017 1411 04/08/2017 2101 Full Code [id number]  Saundra Shelling, MD Inpatient   02/08/2016 1414 02/11/2016 1729 Full Code [id number]  Hessie Knows, MD Inpatient   11/27/2015 1645 11/29/2015 1628 Full Code [id number]  Idelle Crouch, MD Inpatient   02/10/2015 2122 02/12/2015 1809 Full Code [id number]  Lytle Butte, MD ED   09/16/2014 1220 09/19/2014 1736 Full Code [id number]  Aldean Jewett, MD Inpatient       TOTAL TIME TAKING CARE OF THIS PATIENT: 55 minutes.  Critical care time spent   Dustin Flock M.D on 02/24/2018 at 8:50 PM  Between 7am to 6pm - Pager - [phone]  After 6pm go to  [email or web address] - Proofreader  [facility]  [phone]  CC: Primary care physician; Valerie Roys, DO

## 2018-02-24 NOTE — Progress Notes (Signed)
eLink Physician-Brief Progress Note Patient Name: Katherine [name] DOB: May 23, 1940 MRN: [id number]   Date of Service  02/24/2018  HPI/Events of Note  78 yo female with PMH of COPD on 2 L/min [place] O2. Presents with AECOPD. Now on BIPAP. Being admitted to stepdown bed. PCCM asked to assume care. VSS.   eICU Interventions  Now new orders.      Intervention Category Evaluation Type: New Patient Evaluation  Lysle Dingwall 02/24/2018, 10:56 PM

## 2018-02-24 NOTE — ED Notes (Signed)
Pt maintaining 89-90% with labored breathing on 3 L [place]. Increased to 5 L [place]. Came up to 93% at this time. Will continue to monitor.

## 2018-02-24 NOTE — ED Provider Notes (Signed)
[facility] Emergency Department Provider Note  ____________________________________________  Time seen: Approximately 8:03 PM  I have reviewed the triage vital signs and the nursing notes.   HISTORY  Chief Complaint Chest Pain   HPI Katherine [name] is a 78 y.o. female for history of COPD on 2L [place], CHF, CKD, diabetes, hypertension who presents for evaluation of shortness of breath patient reports a week of cough and progressively worsening shortness of breath.  She was diagnosed with pneumonia at her skilled nursing facility and was put on antibiotics.  Antibiotics finished yesterday.  Today she started feeling worse with progressively worsening shortness of breath which is now severe. She is also complaining of severe chest tightness. No fever, chills, nausea, vomiting, diarrhea. No sharp pleuritic CP. No prior h/o PE or DVT, no leg pain or swelling.   Past Medical History:  Diagnosis Date  . Angioedema   . Anxiety   . Anxiety and depression   . CHF (congestive heart failure) ([place])   . Chronic constipation   . Chronic kidney disease    stage 3  . COPD (chronic obstructive pulmonary disease) ([place])   . Diabetes mellitus without complication ([place])   . Gallstones   . GERD (gastroesophageal reflux disease)   . Hypertension   . Left ventricular hypertrophy   . Osteoarthritis   . Prurigo nodularis   . Seizures ([place])   . Stroke ([place])   . Tobacco abuse   . Vitamin D deficiency disease     Patient Active Problem List   Diagnosis Date Noted  . Chronic diastolic heart failure ([place]) 12/31/2017  . Lymphedema 12/31/2017  . COPD exacerbation ([place]) 11/30/2017  . Urinary tract infection 11/22/2017  . 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[place]) 11/16/2017  . Diabetes mellitus type 2, uncomplicated ([place]) 51/70/0174  . Pressure injury of skin 10/29/2017  . Acute kidney injury superimposed on CKD ([place]) 10/24/2017  . Severe sepsis ([place]) 10/24/2017  . Possible  Seizures ([place]) 10/08/2017  . Hyperlipemia 10/08/2017  . 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 Stroke-like episode ([place]) s/p IV tpa 10/04/2017  . Palliative care by specialist   . Elevated rheumatoid factor 09/05/2017  . Frequent hospital admissions 08/22/2017  . Aphasia 06/28/2017  . Hand pain 03/21/2017  . Dry skin 03/21/2017  . Pain in finger of left hand 09/12/2016  . Chronic fatigue 06/12/2016  . Left knee pain 06/12/2016  . Goals of care, counseling/discussion 03/13/2016  . Primary localized osteoarthritis of right knee 02/08/2016  . OSA (obstructive sleep apnea) 09/16/2015  . Rotator cuff syndrome 09/07/2015  . 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 Sleep disturbance 04/14/2015  . Coronary artery disease 03/14/2015  . 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 Anxiety   . Tobacco abuse   . Prurigo nodularis   . GERD (gastroesophageal reflux disease)   . COPD (chronic obstructive pulmonary disease) ([place])   . Osteoarthritis   . Vitamin D deficiency disease   . Chronic constipation   . CKD (chronic kidney disease), stage III ([place])   . Essential hypertension 05/20/2013  . Morbid obesity ([place]) 05/20/2013    Past Surgical History:  Procedure Laterality Date  . CHOLECYSTECTOMY    . 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   Procedure: TOTAL KNEE ARTHROPLASTY;  Surgeon: Hessie Knows, MD;  Location: [facility] ORS;  Service: Orthopedics;  Laterality: Right;    Prior to Admission medications  Medication Sig Start Date End Date Taking? Authorizing Provider  acetaminophen (TYLENOL) 650 MG CR tablet Take 650 mg by mouth 3 (three) times daily.     [provider]  albuterol (ACCUNEB) 0.63 MG/3ML nebulizer solution Take 3 mLs by nebulization every 8 (eight) hours as needed for wheezing.     [provider]  albuterol (PROVENTIL HFA;VENTOLIN HFA) 108 (90 Base) MCG/ACT inhaler Inhale 2  puffs into the lungs every 4 (four) hours as needed for wheezing or shortness of breath.    [provider]  atorvastatin (LIPITOR) 80 MG tablet Take 80 mg by mouth every evening.     [provider]  cetirizine (ZYRTEC) 10 MG tablet Take 5 mg by mouth at bedtime.    [provider]  cholecalciferol (VITAMIN D) 1000 units tablet Take 1,000 Units by mouth daily.    [provider]  clopidogrel (PLAVIX) 75 MG tablet Take 75 mg by mouth daily.    [provider]  diclofenac sodium (VOLTAREN) 1 % GEL Apply 2 g topically as needed (for left knee pain).     [provider]  diltiazem (CARDIZEM SR) 120 MG 12 hr capsule Take 120 mg by mouth 2 (two) times daily.    [provider]  docusate sodium (COLACE) 100 MG capsule Take 100 mg by mouth daily.    [provider]  FLUoxetine (PROZAC) 10 MG capsule Take 20 mg by mouth daily.     [provider]  Fluticasone-Umeclidin-Vilant 100-62.5-25 MCG/INH AEPB Inhale 1 puff into the lungs daily.    [provider]  furosemide (LASIX) 20 MG tablet Take 1 tablet (20 mg total) by mouth daily. 11/14/17   Salary, Avel Peace, MD  gabapentin (NEURONTIN) 300 MG capsule Take 300 mg by mouth at bedtime.    [provider]  guaiFENesin (MUCINEX) 600 MG 12 hr tablet Take 1 tablet (600 mg total) by mouth 2 (two) times daily. 11/13/17   Salary, Avel Peace, MD  hydrALAZINE (APRESOLINE) 25 MG tablet Take 25 mg by mouth 3 (three) times daily.    [provider]  ipratropium (ATROVENT) 0.03 % nasal spray Place 2 sprays into both nostrils 2 (two) times daily.     [provider]  ipratropium-albuterol (DUONEB) 0.5-2.5 (3) MG/3ML SOLN Take 3 mLs by nebulization 2 (two) times daily.     [provider]  levETIRAcetam (KEPPRA XR) 500 MG 24 hr tablet Take 1 tablet (500 mg total) by mouth daily. 11/19/17   Venancio Poisson, NP  linagliptin (TRADJENTA) 5 MG TABS tablet  Take 5 mg by mouth daily.    [provider]  LORazepam (ATIVAN) 2 MG/ML injection Inject 1 mg into the muscle as needed for seizure. 12/18/17   [provider]  meclizine (ANTIVERT) 25 MG tablet Take 25 mg by mouth every 12 (twelve) hours as needed for dizziness.     [provider]  Multiple Vitamin (MULTIVITAMIN WITH MINERALS) TABS tablet Take 1 tablet by mouth daily.    [provider]  pantoprazole (PROTONIX) 40 MG tablet Take 40 mg by mouth daily.    [provider]  predniSONE (STERAPRED UNI-PAK 21 TAB) 10 MG (21) TBPK tablet 6 tabs day 1 and taper 10 mg a day - 6 days Patient not taking: Reported on 02/11/2018 12/26/17   Hillary Bow, MD  vitamin C (ASCORBIC ACID) 500 MG tablet Take 500 mg by mouth 2 (two) times daily.    [provider]  zinc sulfate 220 (50 Zn) MG capsule Take 220 mg by mouth daily.    [provider]    Allergies Bee venom and Enalapril maleate  Family History  Problem Relation Age of Onset  . Alcohol abuse Mother   . Sickle cell anemia Daughter   . Hypertension Son   . Cancer Neg Hx   . COPD Neg Hx   . Diabetes Neg Hx   . Heart disease Neg Hx   . Stroke Neg Hx     Social History Social History   Tobacco Use  . Smoking status: Former Smoker    Packs/day: 0.50    Years: 60.00    Pack years: 30.00    Types: Cigarettes  . Smokeless tobacco: Never Used  Substance Use Topics  . Alcohol use: No    Alcohol/week: 0.0 standard drinks    Comment: rare  . Drug use: No    Review of Systems  Constitutional: Negative for fever. Eyes: Negative for visual changes. ENT: Negative for sore throat. Neck: No neck pain  Cardiovascular: + chest tightness Respiratory: +shortness of breath, cough Gastrointestinal: Negative for abdominal pain, vomiting or diarrhea. Genitourinary: Negative for dysuria. Musculoskeletal: Negative for back pain. Skin: Negative for rash. Neurological: Negative for  headaches, weakness or numbness. Psych: No SI or HI  ____________________________________________   PHYSICAL EXAM:  VITAL SIGNS: ED Triage Vitals  Enc Vitals Group     BP 02/24/18 1828 (!) 125/108     Pulse Rate 02/24/18 1828 (!) 105     Resp 02/24/18 1828 (!) 31     Temp 02/24/18 1823 99.1 F (37.3 C)     Temp Source 02/24/18 1823 Oral     SpO2 02/24/18 1828 92 %     Weight 02/24/18 1826 (!) 320 lb (145.2 kg)     Height 02/24/18 1826 5\' 8"  (1.727 m)     Head Circumference --      Peak Flow --      Pain Score 02/24/18 1824 10     Pain Loc --      Pain Edu? --      Excl. in [place]? --     Constitutional: Alert and oriented, moderate respiratory distress.  HEENT:      Head: Normocephalic and atraumatic.         Eyes: Conjunctivae are normal. Sclera is non-icteric.       Mouth/Throat: Mucous membranes are moist.       Neck: Supple with no signs of meningismus. Cardiovascular: Tachycardic with regular rhythm. Respiratory: Increased work of breathing, hypoxic on baseline 2 L, diffuse crackles and decrease air movement bilaterally gastrointestinal: Soft, non tender, and non distended with positive bowel sounds. No rebound or guarding. Musculoskeletal: 1+ pitting edema bilaterally Neurologic: Normal speech and language. Face is symmetric. Moving all extremities. No gross focal neurologic deficits are appreciated. Skin: Skin is warm, dry and intact. No rash noted. Psychiatric: Mood and affect are normal. Speech and behavior are normal.  ____________________________________________   LABS (all labs ordered are listed, but only abnormal results are displayed)  Labs Reviewed  BASIC METABOLIC PANEL - Abnormal; Notable for the following components:      Result Value   Glucose, Bld 121 (*)    Creatinine, Ser 1.97 (*)    GFR calc non Af Amer 24 (*)    GFR calc Af Amer 28 (*)    All other components within normal limits  CBC - Abnormal; Notable for the following components:  RBC  3.71 (*)    Hemoglobin 9.8 (*)    HCT 32.1 (*)    RDW 18.5 (*)    All other components within normal limits  CULTURE, BLOOD (ROUTINE X 2)  CULTURE, BLOOD (ROUTINE X 2)  TROPONIN I  INFLUENZA PANEL BY PCR (TYPE A & B)  BRAIN NATRIURETIC PEPTIDE  URINALYSIS, ROUTINE W REFLEX MICROSCOPIC  I-STAT CG4 LACTIC ACID, ED  I-STAT CG4 LACTIC ACID, ED   ____________________________________________  EKG  ED ECG REPORT I, Rudene Re, the attending physician, personally viewed and interpreted this ECG.  Sinus tachycardia, rate of 106, normal intervals, right axis deviation, no ST elevations or depressions. ____________________________________________  RADIOLOGY  I have personally reviewed the images performed during this visit and I agree with the Radiologist's read.   Interpretation by Radiologist:  Dg Chest Port 1 View  Result Date: 02/24/2018 CLINICAL DATA:  Chest pain. Increasing oxygen requirement. Left lung pneumonia 6 days ago. EXAM: PORTABLE CHEST 1 VIEW COMPARISON:  12/26/2017 FINDINGS: Shallow inspiration with linear atelectasis in the lung bases. Cardiac enlargement. No vascular congestion, edema, or consolidation. No blunting of costophrenic angles. No pneumothorax. Mediastinal contours appear intact. Calcification of the aorta. IMPRESSION: Shallow inspiration with linear atelectasis in the lung bases. Cardiac enlargement. Electronically Signed   By: Lucienne Capers M.D.   On: 02/24/2018 19:48     ____________________________________________   PROCEDURES  Procedure(s) performed: None Procedures Critical Care performed: yes  CRITICAL CARE Performed by: Rudene Re  ?  Total critical care time: 35 min  Critical care time was exclusive of separately billable procedures and treating other patients.  Critical care was necessary to treat or prevent imminent or life-threatening deterioration.  Critical care was time spent personally by me on the following  activities: development of treatment plan with patient and/or surrogate as well as nursing, discussions with consultants, evaluation of patient's response to treatment, examination of patient, obtaining history from patient or surrogate, ordering and performing treatments and interventions, ordering and review of laboratory studies, ordering and review of radiographic studies, pulse oximetry and re-evaluation of patient's condition.  ____________________________________________   INITIAL IMPRESSION / ASSESSMENT AND PLAN / ED COURSE  78 y.o. female for history of COPD on 2L [place], CHF, CKD, diabetes, hypertension who presents for evaluation of shortness of breath patient reports a week of cough and progressively worsening shortness of breath.  Patient arrives in moderate respiratory distress with increased oxygen requirement, looks volume overloaded on exam.  Finished a course of antibiotics yesterday for a left lower lobe pneumonia.  Unknown which antibiotic.  Patient has tachypnea, tachycardia, low-grade fever and recent diagnosis of pneumonia, therefore will cover broadly with cefepime and vancomycin for possible H CAP.  Patient was given 3 DuoNeb's, Solu-Medrol and Lasix.  She continued to have significant increased work of breathing, tachypnea, and oxygen requirement went up to 6 L therefore patient was placed on BiPAP.  Chest x-ray with shallow inspiration therefore not really reliable at this time to rule out pneumonia. Flu negative. Discussed with Dr. Posey Pronto at [facility] for admission      As part of my medical decision making, I reviewed the following data within the [place] notes reviewed and incorporated, Labs reviewed , EKG interpreted , Old EKG reviewed, Old chart reviewed, Radiograph reviewed , Discussed with admitting physician , Notes from prior ED visits and [place] Controlled Substance Database    Pertinent labs & imaging results that were available during my care of the  patient were reviewed by me and considered in my medical decision making (see chart for details).    ____________________________________________   FINAL CLINICAL IMPRESSION(S) / ED DIAGNOSES  Final diagnoses:  Acute respiratory failure with hypoxia (HCC)  COPD exacerbation (HCC)  HCAP (healthcare-associated pneumonia)  Sepsis with acute hypoxic respiratory failure without septic shock, due to unspecified organism ([place])  Acute on chronic congestive heart failure, unspecified heart failure type ([place])      NEW MEDICATIONS STARTED DURING THIS VISIT:  ED Discharge Orders    None       Note:  This document was prepared using Dragon voice recognition software and may include unintentional dictation errors.    Alfred Levins, [place], MD 02/24/18 2014

## 2018-02-24 NOTE — ED Triage Notes (Signed)
Pt arrives [facility] from [facility]. Usually on 2 L [place]. Was 93% so EMS placed pt on 4 L and came up to 95%. Dx with L lung pneumonia 6 days ago. VSS and EKG clear with EMS. Lung sounds clear with EMS.

## 2018-02-24 NOTE — Progress Notes (Signed)
Lovenox changed to 40 mg BID for BMI >40 and CrCl >30. 

## 2018-02-24 NOTE — ED Notes (Addendum)
Attempted ultrasound IV x 2 by this RN. Able to obtain blood but vein blew and IV was not saved. Asked Sarita Haver to take a look with [place].   One set of blood cultures sent at this time from R upper arm.

## 2018-02-24 NOTE — Progress Notes (Signed)
Patient on Bipap at this time. Alert and oriented. No acute distress noted.  C/o left flank pain. Pain 5/10.  NP aware.  Will continue to monitor.  Report given to oncoming nurse.

## 2018-02-25 ENCOUNTER — Inpatient Hospital Stay: Payer: Medicare HMO

## 2018-02-25 DIAGNOSIS — I509 Heart failure, unspecified: Secondary | ICD-10-CM

## 2018-02-25 LAB — BLOOD CULTURE ID PANEL (REFLEXED)
Acinetobacter baumannii: NOT DETECTED
CANDIDA KRUSEI: NOT DETECTED
Candida albicans: NOT DETECTED
Candida glabrata: NOT DETECTED
Candida parapsilosis: NOT DETECTED
Candida tropicalis: NOT DETECTED
ESCHERICHIA COLI: NOT DETECTED
Enterobacter cloacae complex: NOT DETECTED
Enterobacteriaceae species: NOT DETECTED
Enterococcus species: NOT DETECTED
Haemophilus influenzae: NOT DETECTED
Klebsiella oxytoca: NOT DETECTED
Klebsiella pneumoniae: NOT DETECTED
Listeria monocytogenes: NOT DETECTED
Methicillin resistance: DETECTED — AB
Neisseria meningitidis: NOT DETECTED
Proteus species: NOT DETECTED
Pseudomonas aeruginosa: NOT DETECTED
SERRATIA MARCESCENS: NOT DETECTED
STAPHYLOCOCCUS AUREUS BCID: NOT DETECTED
Staphylococcus species: DETECTED — AB
Streptococcus agalactiae: NOT DETECTED
Streptococcus pneumoniae: NOT DETECTED
Streptococcus pyogenes: NOT DETECTED
Streptococcus species: NOT DETECTED

## 2018-02-25 LAB — BASIC METABOLIC PANEL
Anion gap: 12 (ref 5–15)
BUN: 23 mg/dL (ref 8–23)
CO2: 30 mmol/L (ref 22–32)
Calcium: 9 mg/dL (ref 8.9–10.3)
Chloride: 96 mmol/L — ABNORMAL LOW (ref 98–111)
Creatinine, Ser: 2.04 mg/dL — ABNORMAL HIGH (ref 0.44–1.00)
GFR calc Af Amer: 27 mL/min — ABNORMAL LOW (ref >60)
GFR, EST NON AFRICAN AMERICAN: 23 mL/min — AB (ref >60)
GLUCOSE: 204 mg/dL — AB (ref 70–99)
Potassium: 4 mmol/L (ref 3.5–5.1)
Sodium: 138 mmol/L (ref 135–145)

## 2018-02-25 LAB — CBC
HCT: 32.2 % — ABNORMAL LOW (ref 36.0–46.0)
Hemoglobin: 9.9 g/dL — ABNORMAL LOW (ref 12.0–15.0)
MCH: 26.3 pg (ref 26.0–34.0)
MCHC: 30.7 g/dL (ref 30.0–36.0)
MCV: 85.6 fL (ref 80.0–100.0)
Platelets: 245 10*3/uL (ref 150–400)
RBC: 3.76 MIL/uL — ABNORMAL LOW (ref 3.87–5.11)
RDW: 18.5 % — ABNORMAL HIGH (ref 11.5–15.5)
WBC: 14.3 10*3/uL — ABNORMAL HIGH (ref 4.0–10.5)
nRBC: 0 % (ref 0.0–0.2)

## 2018-02-25 LAB — TROPONIN I: Troponin I: <0.03 ng/mL (ref <0.03)

## 2018-02-25 LAB — GLUCOSE, CAPILLARY
Glucose-Capillary: 210 mg/dL — ABNORMAL HIGH (ref 70–99)
Glucose-Capillary: 217 mg/dL — ABNORMAL HIGH (ref 70–99)
Glucose-Capillary: 225 mg/dL — ABNORMAL HIGH (ref 70–99)
Glucose-Capillary: 201 mg/dL — ABNORMAL HIGH (ref 70–99)

## 2018-02-25 LAB — MRSA PCR SCREENING: MRSA BY PCR: NEGATIVE

## 2018-02-25 MED ORDER — AZITHROMYCIN 250 MG PO TABS
250.0000 mg | ORAL_TABLET | Freq: Every day | ORAL | Status: DC
  Administered 2018-02-26 – 2018-02-27 (×2): 250 mg via ORAL
  Filled 2018-02-25 (×3): qty 1

## 2018-02-25 MED ORDER — AZITHROMYCIN 500 MG PO TABS
500.0000 mg | ORAL_TABLET | Freq: Once | ORAL | Status: AC
  Administered 2018-02-25: 500 mg via ORAL
  Filled 2018-02-25 (×2): qty 1

## 2018-02-25 MED ORDER — BUDESONIDE 0.5 MG/2ML IN SUSP
0.5000 mg | Freq: Two times a day (BID) | RESPIRATORY_TRACT | Status: DC
  Administered 2018-02-25 – 2018-02-26 (×2): 0.5 mg via RESPIRATORY_TRACT
  Filled 2018-02-25 (×2): qty 2

## 2018-02-25 NOTE — Progress Notes (Signed)
PHARMACY - PHYSICIAN COMMUNICATION CRITICAL VALUE ALERT - BLOOD CULTURE IDENTIFICATION (BCID)  Katherine [name] is an 77 y.o. female who presented to [facility] on 02/24/2018 with a chief complaint of respiratory failure.   Assessment:  COPD exacerbation; patient on azithromycin.   Name of physician (or Provider) Contacted: Kasa   Current antibiotics: azithromycin   Changes to prescribed antibiotics recommended:  Likely contaminant; no change.   Results for orders placed or performed during the hospital encounter of 02/24/18  Blood Culture ID Panel (Reflexed) (Collected: 02/24/2018  6:53 PM)  Result Value Ref Range   Enterococcus species NOT DETECTED NOT DETECTED   Listeria monocytogenes NOT DETECTED NOT DETECTED   Staphylococcus species DETECTED (A) NOT DETECTED   Staphylococcus aureus (BCID) NOT DETECTED NOT DETECTED   Methicillin resistance DETECTED (A) NOT DETECTED   Streptococcus species NOT DETECTED NOT DETECTED   Streptococcus agalactiae NOT DETECTED NOT DETECTED   Streptococcus pneumoniae NOT DETECTED NOT DETECTED   Streptococcus pyogenes NOT DETECTED NOT DETECTED   Acinetobacter baumannii NOT DETECTED NOT DETECTED   Enterobacteriaceae species NOT DETECTED NOT DETECTED   Enterobacter cloacae complex NOT DETECTED NOT DETECTED   Escherichia coli NOT DETECTED NOT DETECTED   Klebsiella oxytoca NOT DETECTED NOT DETECTED   Klebsiella pneumoniae NOT DETECTED NOT DETECTED   Proteus species NOT DETECTED NOT DETECTED   Serratia marcescens NOT DETECTED NOT DETECTED   Haemophilus influenzae NOT DETECTED NOT DETECTED   Neisseria meningitidis NOT DETECTED NOT DETECTED   Pseudomonas aeruginosa NOT DETECTED NOT DETECTED   Candida albicans NOT DETECTED NOT DETECTED   Candida glabrata NOT DETECTED NOT DETECTED   Candida krusei NOT DETECTED NOT DETECTED   Candida parapsilosis NOT DETECTED NOT DETECTED   Candida tropicalis NOT DETECTED NOT DETECTED    Simpson,Michael L 02/25/2018  4:36  PM

## 2018-02-25 NOTE — Care Management Note (Signed)
Case Management Note  Patient Details  Name: Katherine [name] MRN: [id number] Date of Birth: [birth year]  Subjective/Objective:   Patient admitted with COPD exacerbation.  Patient is from [facility], she reports she has been there for about 3 months, before that patient lived with her husband.  Plan at discharge is for patient to return to [facility].  Husband is at the bedside and reports that he is looking for a suitable place to live, currently he is living with his cousin.  Husband reports that when he finds a place he will bring his wife home and will need home health services.  Patient reports that she has not been walking much.  Patient is on Chronic O2 at 2L, oxygen is from [place].  PCP is Social research officer, government and she follows up every 3 months.  Pharmacy is [facility].  RNCM will cont to follow to evaluate for additional needs at discharge.  LCSW consulted for return to [facility]. Doran Clay RN BSN [phone]                  Action/Plan:   Expected Discharge Date:                  Expected Discharge Plan:  Skilled Nursing Facility  In-House Referral:  Clinical Social Work  Discharge planning Services  CM Consult  Post Acute Care Choice:    Choice offered to:     DME Arranged:    DME Agency:     HH Arranged:    [place] Agency:     Status of Service:  In process, will continue to follow  If discussed at Long Length of Stay Meetings, dates discussed:    Additional Comments:  Shelbie Hutching, RN 02/25/2018, 1:55 PM

## 2018-02-25 NOTE — Consult Note (Signed)
PULMONARY / CRITICAL CARE MEDICINE  Name: Katherine [name] MRN: [id number] DOB: [birth year]    LOS: 1  Referring Provider: Dr. Posey Pronto Reason for Referral: Respiratory failure Brief patient description: 78 year old female with a history of CHF and COPD being admitted for acute hypoxic respiratory failure  HPI: This is a 78 year old female, nursing home resident at [place],  history of COPD on home O2 at 2 L nasal cannula, CKD stage III, type 2 diabetes, hypertension, and congestive heart failure who presented to the ED with shortness of breath that has progressively gotten worse over the course of a week.  History is obtained from ED records and patient spouse as patient is currently on continuous BiPAP and still short of breath.  Per ED records, patient was recently diagnosed with pneumonia and was started on antibiotics.  She finished a course of probiotics yesterday.  She was also seen by cardiology on 02/11/2018 for routine follow-up and complaints of shortness of breath.  Despite treatment from cardiology and from her PCP at the nursing home, and shortness of breath continued to worsen hence she decided to come to the emergency room for evaluation.  At the ED, she was placed on 3 L nasal cannula and subsequently dilated on 5 L nasal cannula but remains tachypneic and hypoxic hence she was placed on BiPAP.  Her chest x-ray was negative for any acute infiltrates.  She is being admitted to the ICU for acute COPD exacerbation and acute on chronic respiratory failure with hypoxemia.  Her rapid influenza was negative  Past Medical History:  Diagnosis Date  . Angioedema   . Anxiety   . Anxiety and depression   . CHF (congestive heart failure) ([place])   . CHF (congestive heart failure) ([place])   . Chronic constipation   . Chronic kidney disease    stage 3  . COPD (chronic obstructive pulmonary disease) ([place])   . Diabetes mellitus without complication ([place])   . Gallstones   . GERD  (gastroesophageal reflux disease)   . Hypertension   . Left ventricular hypertrophy   . Osteoarthritis   . Prurigo nodularis   . Seizures ([place])   . Stroke ([place])   . Tobacco abuse   . Vitamin D deficiency disease    Past Surgical History:  Procedure Laterality Date  . CHOLECYSTECTOMY    . 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   Procedure: TOTAL KNEE ARTHROPLASTY;  Surgeon: Hessie Knows, MD;  Location: [facility] ORS;  Service: Orthopedics;  Laterality: Right;   Prior to Admission medications   Medication Sig Start Date End Date Taking? Authorizing Provider  amLODipine (NORVASC) 5 MG tablet Take 5 mg by mouth daily.   Yes [provider]  clopidogrel (PLAVIX) 75 MG tablet Take 75 mg by mouth daily.   Yes [provider]  donepezil (ARICEPT) 5 MG tablet Take 1 tablet (5 mg total) by mouth at bedtime. 10/08/17 11/17/17 Yes Sowles, Drue Stager, MD  empagliflozin (JARDIANCE) 25 MG TABS tablet Take 25 mg by mouth daily.   Yes [provider]  glycopyrrolate (ROBINUL) 1 MG tablet Take 1 mg by mouth 2 (two) times daily.   Yes [provider]  insulin aspart (NOVOLOG FLEXPEN) 100 UNIT/ML FlexPen Inject 12 Units into the skin 2 (two) times daily.   Yes [provider]  insulin aspart (NOVOLOG) 100 UNIT/ML FlexPen Inject 18 Units into the skin daily. At 1700   Yes [provider]  Insulin  Degludec-Liraglutide (XULTOPHY) 100-3.6 UNIT-MG/ML SOPN Inject 50 Units into the skin daily.   Yes [provider]  levETIRAcetam (KEPPRA) 500 MG tablet Take 500 mg by mouth 2 (two) times daily.   Yes [provider]  lipase/protease/amylase (CREON) 12000 units CPEP capsule Take 6,000 Units by mouth 3 (three) times daily before meals.   Yes [provider]  lipase/protease/amylase (CREON) 12000 units CPEP capsule Take 3,000 Units by mouth at bedtime. With snack   Yes [provider]  lisinopril  (PRINIVIL,ZESTRIL) 5 MG tablet Take 5 mg by mouth daily.   Yes [provider]  metoprolol succinate (TOPROL-XL) 25 MG 24 hr tablet Take 1 tablet (25 mg total) by mouth daily. 10/08/17  Yes Sowles, Drue Stager, MD  rosuvastatin (CRESTOR) 40 MG tablet Take 1 tablet (40 mg total) by mouth daily. 10/08/17 11/17/17 Yes Steele Sizer, MD  aspirin EC 81 MG tablet Take 81 mg by mouth daily.    [provider]  famotidine (PEPCID) 20 MG tablet Take 1 tablet (20 mg total) by mouth 2 (two) times daily. 10/08/17 11/07/17  Steele Sizer, MD  gabapentin (NEURONTIN) 300 MG capsule Take 1 capsule (300 mg total) by mouth 2 (two) times daily. 10/08/17 11/07/17  Steele Sizer, MD  insulin glargine (LANTUS) 100 UNIT/ML injection Inject 0.1 mLs (10 Units total) into the skin daily. 10/08/17 11/07/17  Steele Sizer, MD  lacosamide 100 MG TABS Take 1 tablet (100 mg total) by mouth 2 (two) times daily. Patient not taking: Reported on 11/17/2017 02/23/17   [name], MD  promethazine (PHENERGAN) 12.5 MG tablet Take 1 tablet (12.5 mg total) by mouth every 6 (six) hours as needed for nausea or vomiting. Patient not taking: Reported on 11/17/2017 12/12/16   Stark Klein, MD  sertraline (ZOLOFT) 25 MG tablet Take 1 tablet (25 mg total) by mouth daily. Patient not taking: Reported on 11/17/2017 10/08/17   Steele Sizer, MD   Allergies Allergies  Allergen Reactions  . Bee Venom Swelling  . Enalapril Maleate Swelling    Family History Family History  Problem Relation Age of Onset  . Alcohol abuse Mother   . Sickle cell anemia Daughter   . Hypertension Son   . Cancer Neg Hx   . COPD Neg Hx   . Diabetes Neg Hx   . Heart disease Neg Hx   . Stroke Neg Hx    Social History  reports that she has quit smoking. Her smoking use included cigarettes. She has a 30.00 pack-year smoking history. She has never used smokeless tobacco. She reports that she does not drink alcohol or use drugs.  Review Of Systems:  Unable to obtain as patient is on continuous BiPAP and still dyspneic  VITAL SIGNS: BP 104/72   Pulse (!) 103   Temp 97.8 F (36.6 C) (Axillary)   Resp 20   Ht 5\' 8"  (1.727 m)   Wt 116.5 kg   SpO2 97%   BMI 39.05 kg/m   HEMODYNAMICS:    VENTILATOR SETTINGS: FiO2 (%):  [30 %] 30 %  INTAKE / OUTPUT: No intake/output data recorded.  PHYSICAL EXAMINATION: General: Chronically ill looking, in moderate respiratory distress HEENT: PERRLA, trachea midline, mild JVD Neuro: Alert and oriented x2, moves all extremities, follows basic commands Cardiovascular: Apical pulse regular, S1-S2, no murmur regurg or gallop, +2 pulses bilaterally, +3 edema Lungs: Bilateral breath sounds with diffuse expiratory wheezes, diminished in the bases  Abdomen: Nondistended, normal bowel sounds in all 4 quadrants, palpation reveals no  organomegaly Musculoskeletal: Positive range of motion in upper and lower extremities, no lymphedema in bilateral lower extremities Skin: Warm and dry  LABS:  BMET Recent Labs  Lab 02/24/18 1837 02/25/18 0429  NA 138 138  K 4.0 4.0  CL 98 96*  CO2 31 30  BUN 22 23  CREATININE 1.97* 2.04*  GLUCOSE 121* 204*    Electrolytes Recent Labs  Lab 02/24/18 1837 02/25/18 0429  CALCIUM 8.9 9.0    CBC Recent Labs  Lab 02/24/18 1837 02/25/18 0429  WBC 10.2 14.3*  HGB 9.8* 9.9*  HCT 32.1* 32.2*  PLT 228 245    Coag's No results for input(s): APTT, INR in the last 168 hours.  Sepsis Markers Recent Labs  Lab 02/24/18 1837 02/24/18 2215  LATICACIDVEN  --  1.64  PROCALCITON 0.11  --     ABG No results for input(s): PHART, PCO2ART, PO2ART in the last 168 hours.  Liver Enzymes No results for input(s): AST, ALT, ALKPHOS, BILITOT, ALBUMIN in the last 168 hours.  Cardiac Enzymes Recent Labs  Lab 02/24/18 1837 02/24/18 2211 02/25/18 0429  TROPONINI <0.03 <0.03 <0.03    Glucose Recent Labs  Lab 02/24/18 2230  GLUCAP 115*    Imaging Dg  Chest Port 1 View  Result Date: 02/24/2018 CLINICAL DATA:  Chest pain. Increasing oxygen requirement. Left lung pneumonia 6 days ago. EXAM: PORTABLE CHEST 1 VIEW COMPARISON:  12/26/2017 FINDINGS: Shallow inspiration with linear atelectasis in the lung bases. Cardiac enlargement. No vascular congestion, edema, or consolidation. No blunting of costophrenic angles. No pneumothorax. Mediastinal contours appear intact. Calcification of the aorta. IMPRESSION: Shallow inspiration with linear atelectasis in the lung bases. Cardiac enlargement. Electronically Signed   By: Lucienne Capers M.D.   On: 02/24/2018 19:48     STUDIES:  Plan 2D echo on 10/02/2017 showed an EF ofLV EF: 60% -   65%  CULTURES: Blood cultures x2  ANTIBIOTICS: Azithromycin  SIGNIFICANT EVENTS: 02/24/2018: Admitted  LINES/TUBES: Peripheral IVs  DISCUSSION: 78 year old female presenting with acute on chronic respiratory failure, acute COPD exacerbation, and acute on diastolic chronic heart failure  ASSESSMENT Acute on chronic respiratory failure with hypoxemia Acute COPD exacerbation Acute  dCHF exacerbation Type 2 diabetes mellitus Hypertension CKD stage III  PLAN Continues BiPAP and titrate Mr. cardiovascular motivated IV and inhaled steroids Nebulized bronchodilators Gentle IV diuresis Cardiology consult Antibiotics as above Chest x-ray and ABG PRN Continue all home medications Further changes in treatment plan pending clinical course and diagnostics  Best Practice: Code Status: Full code Diet: N.p.o. with sips and meds while on BiPAP and heart healthy diet once off BiPAP GI prophylaxis: Not indicated VTE prophylaxis:  Subcu Lovenox   FAMILY  - Updates: Patient and spouse updated on current treatment plan at bedside  Kylee Nardozzi S. [facility] ANP-BC Pulmonary and Critical Care Medicine [facility] Pager [phone] or [phone]  NB: This document was prepared using Dragon voice recognition  software and may include unintentional dictation errors.    02/25/2018, 6:01 AM

## 2018-02-25 NOTE — Progress Notes (Signed)
[place] at [place] NAME: Katherine [name]    MR#:  [id number]  DATE OF BIRTH:  17-May-1940  SUBJECTIVE:  CHIEF COMPLAINT:   Chief Complaint  Patient presents with  . Chest Pain   No new complaints.  Patient reports improvement in shortness of breath.  No fevers.  REVIEW OF SYSTEMS:  Review of Systems  Constitutional: Negative for chills and fever.  HENT: Negative for hearing loss and tinnitus.   Eyes: Negative for blurred vision and double vision.  Respiratory: Positive for shortness of breath. Negative for cough.   Cardiovascular: Negative for chest pain and palpitations.  Gastrointestinal: Negative for heartburn.  Genitourinary: Negative for dysuria.  Musculoskeletal: Negative for myalgias and neck pain.  Skin: Negative for itching and rash.  Neurological: Negative for dizziness and headaches.  Psychiatric/Behavioral: Negative for depression and hallucinations.    DRUG ALLERGIES:   Allergies  Allergen Reactions  . Bee Venom Swelling  . Enalapril Maleate Swelling   VITALS:  Blood pressure (!) 109/53, pulse 88, temperature 98.5 F (36.9 C), temperature source Oral, resp. rate (!) 23, height 5\' 8"  (1.727 m), weight 116.5 kg, SpO2 93 %. PHYSICAL EXAMINATION:  GENERAL:off biPAP, on oxygen via nasal cannula HEAD: Normocephalic, atraumatic.  EYES: Pupils equal, round, reactive to light.  No scleral icterus.  MOUTH: Moist mucosal membrane. NECK: Supple. No thyromegaly. No nodules. No JVD.  PULMONARY: +rhonchi, +wheezing CARDIOVASCULAR: S1 and S2. Regular rate and rhythm. No murmurs, rubs, or gallops.  GASTROINTESTINAL: Soft, nontender, -distended. No masses. Positive bowel sounds. No hepatosplenomegaly.  MUSCULOSKELETAL: No swelling, clubbing, or edema.  NEUROLOGIC: alert and awake.  No focal deficits SKIN:intact,warm,dry  LABORATORY PANEL:  Female CBC Recent Labs  Lab 02/25/18 0429  WBC 14.3*  HGB 9.9*  HCT 32.2*  PLT  245   ------------------------------------------------------------------------------------------------------------------ Chemistries  Recent Labs  Lab 02/25/18 0429  NA 138  K 4.0  CL 96*  CO2 30  GLUCOSE 204*  BUN 23  CREATININE 2.04*  CALCIUM 9.0   RADIOLOGY:  Dg Chest Port 1 View  Result Date: 02/24/2018 CLINICAL DATA:  Chest pain. Increasing oxygen requirement. Left lung pneumonia 6 days ago. EXAM: PORTABLE CHEST 1 VIEW COMPARISON:  12/26/2017 FINDINGS: Shallow inspiration with linear atelectasis in the lung bases. Cardiac enlargement. No vascular congestion, edema, or consolidation. No blunting of costophrenic angles. No pneumothorax. Mediastinal contours appear intact. Calcification of the aorta. IMPRESSION: Shallow inspiration with linear atelectasis in the lung bases. Cardiac enlargement. Electronically Signed   By: Lucienne Capers M.D.   On: 02/24/2018 19:48   ASSESSMENT AND PLAN:    1.  Acute on chronic respiratory failure due to acute on chronic COPD exasperation We will place patient on IV Solu-Medrol Patient initially required BiPAP.  IV steroids.  Antibiotics with azithromycin Significantly improved clinically. Plans for transfer out of ICU once bed available. Appreciate input from critical care physician.    Chronic hypoxic respiratory failure. Patient on 2 L of home oxygen therapy at home. Continue oxygen supplementation.  2.  History of congestive heart failure currently compensated continue oral Lasix  3.  Diabetes type 2 Place her on sliding scale insulin monitor blood sugars  4.  Previous history of CVA continue Plavix  5.  Hypertension continue hydralazine  6.  History of seizure continue Keppra  DVT prophylaxis; Lovenox  All the records are reviewed and case discussed with Care Management/Social Worker. Management plans discussed with the patient, family and they are in  agreement.  CODE STATUS: Full Code  TOTAL TIME TAKING CARE OF THIS  PATIENT: 33 minutes.   More than 50% of the time was spent in counseling/coordination of care: YES  POSSIBLE D/C IN 1-2 DAYS, DEPENDING ON CLINICAL CONDITION.   Donelle Hise M.D on 02/25/2018 at 4:40 PM  Between 7am to 6pm - Pager - [phone]  After 6pm go to [email or web address] - Proofreader  [facility]  Office  [phone]  CC: Primary care physician; Valerie Roys, DO  Note: This dictation was prepared with Dragon dictation along with smaller phrase technology. Any transcriptional errors that result from this process are unintentional.

## 2018-02-25 NOTE — Progress Notes (Signed)
Pastoral Care Consult    02/25/18 1330  Clinical Encounter Type  Visited With Patient  Visit Type Initial  Referral From Nurse  Consult/Referral To Chaplain  Spiritual Encounters  Spiritual Needs Prayer   Pt shared her wishes to get well and be able to go home.  She also shared her hope of being able to walk again so she can cook. Pt shared that her husband is very supportive.  Has 3 living children but they are not involved much.  Chaplain listed to pt story, provided empathy, and actively listened.  Pt asked for prayer.  Melven Sartorius prayed with pt.  Darcey Nora, Chaplain

## 2018-02-25 NOTE — Progress Notes (Signed)
CRITICAL CARE NOTE  CC  follow up respiratory failure  SUBJECTIVE Weaned off biPAP Alert and awake Follows commands On oxygen  FLU NEG     BP 99/69   Pulse (!) 103   Temp 98.5 F (36.9 C) (Oral)   Resp (!) 24   Ht 5\' 8"  (1.727 m)   Wt 116.5 kg   SpO2 92%   BMI 39.05 kg/m     Review of Systems:  Gen:  Denies  fever, sweats, chills weigh loss  HEENT: Denies blurred vision, double vision, ear pain, eye pain, hearing loss, nose bleeds, sore throat Cardiac:  No dizziness, chest pain or heaviness, chest tightness,edema, No JVD Resp:  +cough +SOB Gi: Denies swallowing difficulty, stomach pain, nausea or vomiting, diarrhea, constipation, bowel incontinence Gu:  Denies bladder incontinence, burning urine Ext:   Denies Joint pain, stiffness or swelling Skin: Denies  skin rash, easy bruising or bleeding or hives Endoc:  Denies polyuria, polydipsia , polyphagia or weight change Psych:   Denies depression, insomnia or hallucinations  Other:  All other systems negative   PHYSICAL EXAMINATION:  GENERAL:off biPAP HEAD: Normocephalic, atraumatic.  EYES: Pupils equal, round, reactive to light.  No scleral icterus.  MOUTH: Moist mucosal membrane. NECK: Supple. No thyromegaly. No nodules. No JVD.  PULMONARY: +rhonchi, +wheezing CARDIOVASCULAR: S1 and S2. Regular rate and rhythm. No murmurs, rubs, or gallops.  GASTROINTESTINAL: Soft, nontender, -distended. No masses. Positive bowel sounds. No hepatosplenomegaly.  MUSCULOSKELETAL: No swelling, clubbing, or edema.  NEUROLOGIC: alert and awake SKIN:intact,warm,dry       ASSESSMENT AND PLAN SYNOPSIS   Severe Hypoxic and Hypercapnic Respiratory Failure Wean off biPAP  SEVERE COPD EXACERBATION -continue IV steroids as prescribed -continue NEB THERAPY as prescribed -morphine as needed -wean fio2 as needed and tolerated   Remain SD status   ELECTROLYTES -follow labs as needed -replace as needed -pharmacy  consultation and following   DVT/GI PRX ordered TRANSFUSIONS AS NEEDED MONITOR FSBS ASSESS the need for LABS as needed     Katherine [name], M.D.  [facility]  Medical Director [place] Director [place] Department

## 2018-02-25 NOTE — Progress Notes (Signed)
   02/25/18 0200  Clinical Encounter Type  Visited With Patient  Visit Type Initial  Referral From Physician  Spiritual Encounters  Spiritual Needs Prayer

## 2018-02-26 ENCOUNTER — Other Ambulatory Visit: Payer: Self-pay

## 2018-02-26 LAB — CBC WITH DIFFERENTIAL/PLATELET
Abs Immature Granulocytes: 0.08 10*3/uL — ABNORMAL HIGH (ref 0.00–0.07)
BASOS PCT: 0 %
Basophils Absolute: 0 10*3/uL (ref 0.0–0.1)
EOS ABS: 0 10*3/uL (ref 0.0–0.5)
Eosinophils Relative: 0 %
HCT: 28.3 % — ABNORMAL LOW (ref 36.0–46.0)
Hemoglobin: 8.9 g/dL — ABNORMAL LOW (ref 12.0–15.0)
Immature Granulocytes: 1 %
Lymphocytes Relative: 5 %
Lymphs Abs: 0.5 10*3/uL — ABNORMAL LOW (ref 0.7–4.0)
MCH: 26.6 pg (ref 26.0–34.0)
MCHC: 31.4 g/dL (ref 30.0–36.0)
MCV: 84.7 fL (ref 80.0–100.0)
Monocytes Absolute: 0.2 10*3/uL (ref 0.1–1.0)
Monocytes Relative: 2 %
Neutro Abs: 10.1 10*3/uL — ABNORMAL HIGH (ref 1.7–7.7)
Neutrophils Relative %: 92 %
PLATELETS: 223 10*3/uL (ref 150–400)
RBC: 3.34 MIL/uL — ABNORMAL LOW (ref 3.87–5.11)
RDW: 18.4 % — ABNORMAL HIGH (ref 11.5–15.5)
WBC: 10.9 10*3/uL — AB (ref 4.0–10.5)
nRBC: 0 % (ref 0.0–0.2)

## 2018-02-26 LAB — BASIC METABOLIC PANEL
Anion gap: 13 (ref 5–15)
BUN: 34 mg/dL — ABNORMAL HIGH (ref 8–23)
CALCIUM: 8.8 mg/dL — AB (ref 8.9–10.3)
CO2: 26 mmol/L (ref 22–32)
Chloride: 96 mmol/L — ABNORMAL LOW (ref 98–111)
Creatinine, Ser: 1.96 mg/dL — ABNORMAL HIGH (ref 0.44–1.00)
GFR calc Af Amer: 28 mL/min — ABNORMAL LOW (ref >60)
GFR, EST NON AFRICAN AMERICAN: 24 mL/min — AB (ref >60)
Glucose, Bld: 199 mg/dL — ABNORMAL HIGH (ref 70–99)
Potassium: 3.8 mmol/L (ref 3.5–5.1)
Sodium: 135 mmol/L (ref 135–145)

## 2018-02-26 LAB — GLUCOSE, CAPILLARY
Glucose-Capillary: 156 mg/dL — ABNORMAL HIGH (ref 70–99)
Glucose-Capillary: 265 mg/dL — ABNORMAL HIGH (ref 70–99)
Glucose-Capillary: 190 mg/dL — ABNORMAL HIGH (ref 70–99)
Glucose-Capillary: 204 mg/dL — ABNORMAL HIGH (ref 70–99)

## 2018-02-26 MED ORDER — FLUTICASONE-UMECLIDIN-VILANT 100-62.5-25 MCG/INH IN AEPB: 1.0000 | INHALATION_SPRAY | Freq: Every day | RESPIRATORY_TRACT | Status: DC

## 2018-02-26 MED ORDER — ALBUTEROL SULFATE (2.5 MG/3ML) 0.083% IN NEBU
2.5000 mg | INHALATION_SOLUTION | RESPIRATORY_TRACT | Status: DC | PRN
  Administered 2018-02-27 (×2): 2.5 mg via RESPIRATORY_TRACT
  Filled 2018-02-26 (×2): qty 3

## 2018-02-26 MED ORDER — FLUTICASONE FUROATE-VILANTEROL 100-25 MCG/INH IN AEPB
1.0000 | INHALATION_SPRAY | Freq: Every day | RESPIRATORY_TRACT | Status: DC
  Administered 2018-02-26 – 2018-02-28 (×3): 1 via RESPIRATORY_TRACT
  Filled 2018-02-26: qty 28

## 2018-02-26 MED ORDER — PREDNISONE 20 MG PO TABS
40.0000 mg | ORAL_TABLET | Freq: Every day | ORAL | Status: DC
  Administered 2018-02-27 – 2018-02-28 (×2): 40 mg via ORAL
  Filled 2018-02-26 (×2): qty 2

## 2018-02-26 MED ORDER — UMECLIDINIUM BROMIDE 62.5 MCG/INH IN AEPB
1.0000 | INHALATION_SPRAY | Freq: Every day | RESPIRATORY_TRACT | Status: DC
  Administered 2018-02-26 – 2018-02-28 (×3): 1 via RESPIRATORY_TRACT
  Filled 2018-02-26: qty 7

## 2018-02-26 NOTE — Progress Notes (Signed)
Inpatient Diabetes Program Recommendations  AACE/ADA: New Consensus Statement on Inpatient Glycemic Control   Target Ranges:  Prepandial:   less than 140 mg/dL      Peak postprandial:   less than 180 mg/dL (1-2 hours)      Critically ill patients:  140 - 180 mg/dL  Results for TAQWA, DEEM (MRN [id number]) as of 02/26/2018 09:54  Ref. Range 02/25/2018 07:09 02/25/2018 11:40 02/25/2018 15:54 02/25/2018 21:17 02/26/2018 07:35  Glucose-Capillary Latest Ref Range: 70 - 99 mg/dL 210 (H) 217 (H) 225 (H) 201 (H) 156 (H)    Review of Glycemic Control  Diabetes history: DM2 Outpatient Diabetes medications: Tradjenta 5 mg daily Current orders for Inpatient glycemic control: Novolog 0-9 units TID with meals, Tradjenta 5 mg daily; Solumedrol 60 mg Q6H  Inpatient Diabetes Program Recommendations:  Insulin - Meal Coverage: If steroids are continued, please consider ordering Novolog 3 units TID with meals for meal coverage if patient eats at least 50% of meals.  Thanks, Barnie Alderman, RN, MSN, CDE Diabetes Coordinator Inpatient Diabetes Program [phone] (Team Pager from 8am to 5pm)

## 2018-02-26 NOTE — Progress Notes (Signed)
[place] at [place] NAME: Katherine [name]    MR#:  [id number]  DATE OF BIRTH:  14-Dec-1940  SUBJECTIVE:  CHIEF COMPLAINT:   Chief Complaint  Patient presents with  . Chest Pain   No new complaints.  Patient reports improvement in shortness of breath.  No fevers.  Awaiting transfer to medical floor once bed available  REVIEW OF SYSTEMS:  Review of Systems  Constitutional: Negative for chills and fever.  HENT: Negative for hearing loss and tinnitus.   Eyes: Negative for blurred vision and double vision.  Respiratory: Positive for shortness of breath. Negative for cough.   Cardiovascular: Negative for chest pain and palpitations.  Gastrointestinal: Negative for heartburn.  Genitourinary: Negative for dysuria.  Musculoskeletal: Negative for myalgias and neck pain.  Skin: Negative for itching and rash.  Neurological: Negative for dizziness and headaches.  Psychiatric/Behavioral: Negative for depression and hallucinations.    DRUG ALLERGIES:   Allergies  Allergen Reactions  . Bee Venom Swelling  . Enalapril Maleate Swelling   VITALS:  Blood pressure 114/82, pulse 92, temperature 97.7 F (36.5 C), temperature source Oral, resp. rate (!) 22, height 5\' 8"  (1.727 m), weight 116.5 kg, SpO2 91 %. PHYSICAL EXAMINATION:  GENERAL:off biPAP, on oxygen via nasal cannula, morbidly obese  HEAD: Normocephalic, atraumatic.  EYES: Pupils equal, round, reactive to light.  No scleral icterus.  MOUTH: Moist mucosal membrane. NECK: Supple. No thyromegaly. No nodules. No JVD.  PULMONARY: +rhonchi, +wheezing CARDIOVASCULAR: S1 and S2. Regular rate and rhythm. No murmurs, rubs, or gallops.  GASTROINTESTINAL: Soft, nontender, -distended. No masses. Positive bowel sounds. No hepatosplenomegaly.  MUSCULOSKELETAL: No swelling, clubbing, or edema.  NEUROLOGIC: alert and awake.  No focal deficits SKIN:intact,warm,dry  LABORATORY PANEL:  Female CBC Recent  Labs  Lab 02/26/18 0216  WBC 10.9*  HGB 8.9*  HCT 28.3*  PLT 223   ------------------------------------------------------------------------------------------------------------------ Chemistries  Recent Labs  Lab 02/26/18 0216  NA 135  K 3.8  CL 96*  CO2 26  GLUCOSE 199*  BUN 34*  CREATININE 1.96*  CALCIUM 8.8*   RADIOLOGY:  No results found. ASSESSMENT AND PLAN:    1.  Acute on chronic respiratory failure due to acute on chronic COPD exasperation Patient on IV Solu-Medrol Patient initially required BiPAP.  IV steroids.  Antibiotics with azithromycin Significantly improved clinically. Plans for transfer out of ICU once bed available. Appreciate input from critical care physician.    Chronic hypoxic respiratory failure. Patient on 2 L of home oxygen therapy at home. Continue oxygen supplementation.  2.  History of congestive heart failure currently compensated continue oral Lasix  3.  Diabetes type 2 Place her on sliding scale insulin monitor blood sugars  4.  Previous history of CVA continue Plavix  5.  Hypertension continue hydralazine  6.  History of seizure continue Keppra  7.  Morbid obesity with a BMI of 39.05. Encourage lifestyle modification including weight loss. Physical therapy to evaluate and treat  DVT prophylaxis; Lovenox  All the records are reviewed and case discussed with Care Management/Social Worker. Management plans discussed with the patient, family and they are in agreement.  CODE STATUS: Full Code  TOTAL TIME TAKING CARE OF THIS PATIENT: 32 minutes.   More than 50% of the time was spent in counseling/coordination of care: YES  POSSIBLE D/C IN 1-2 DAYS, DEPENDING ON CLINICAL CONDITION.   Kawon Willcutt M.D on 02/26/2018 at 3:38 PM  Between 7am to 6pm - Pager - [phone]  After 6pm go to [email or web address] - Proofreader  [facility] [place] [facility]  Office  [phone]  CC: Primary care physician; Valerie Roys, DO  Note: This dictation was prepared with Dragon dictation along with smaller phrase technology. Any transcriptional errors that result from this process are unintentional.

## 2018-02-26 NOTE — Progress Notes (Signed)
Report given to [facility] for patient to be transferred to room 241 with oxygen and 1 bag of personal belongings, including  clothing and a cell phone. Spouse [name] called and voicemail box is full and able to notify of transfer.

## 2018-02-26 NOTE — Progress Notes (Signed)
CRITICAL CARE NOTE  CC  Follow up COPD  SUBJECTIVE Off biPAP Alert and awake +SOB     BP (!) 109/58   Pulse 86   Temp 97.7 F (36.5 C) (Axillary)   Resp 16   Ht 5\' 8"  (1.727 m)   Wt 116.5 kg   SpO2 93%   BMI 39.05 kg/m     Review of Systems:  Gen:  Denies  fever, sweats, chills weigh loss  HEENT: Denies blurred vision, double vision, ear pain, eye pain, hearing loss, nose bleeds, sore throat Cardiac:  No dizziness, chest pain or heaviness, chest tightness,edema, No JVD Resp:   No cough, -sputum production, -shortness of breath,-wheezing, -hemoptysis,  Other:  All other systems negative  Physical Examination:   GENERAL:NAD, no fevers, chills, no weakness no fatigue HEAD: Normocephalic, atraumatic.  EYES: Pupils equal, round, reactive to light. Extraocular muscles intact. No scleral icterus.  MOUTH: Moist mucosal membrane. Dentition intact. No abscess noted.  EAR, NOSE, THROAT: Clear without exudates. No external lesions.  NECK: Supple. No thyromegaly. No nodules. No JVD.  PULMONARY: CTA B/L +rhonchi CARDIOVASCULAR: S1 and S2. Regular rate and rhythm. No murmurs, rubs, or gallops. No edema. Pedal pulses 2+ bilaterally.  GASTROINTESTINAL: Soft, nontender, nondistended. No masses. Positive bowel sounds. No hepatosplenomegaly.  MUSCULOSKELETAL: No swelling, clubbing, or edema. Range of motion full in all extremities.  NEUROLOGIC: Cranial nerves II through XII are intact. No gross focal neurological deficits. Sensation intact. Reflexes intact.  SKIN: No ulceration, lesions, rashes, or cyanosis. Skin warm and dry. Turgor intact.  PSYCHIATRIC: Mood, affect within normal limits. The patient is awake, alert and oriented x 3. Insight, judgment intact.  ALL OTHER ROS ARE NEGATIVE          ASSESSMENT AND PLAN SYNOPSIS  Severe Hypoxic and Hypercapnic Respiratory Failure-resolving Oxygen as needed  SEVERE COPD EXACERBATION-slowly resolving -continue IV steroids as  prescribed -continue NEB THERAPY as prescribed -morphine as needed -wean fio2 as needed and tolerated  ELECTROLYTES -follow labs as needed -replace as needed -pharmacy consultation and following   DVT/GI PRX ordered TRANSFUSIONS AS NEEDED MONITOR FSBS ASSESS the need for LABS as needed  Ok to transfer to gen med floor  Murice Barbar Patricia Pesa, M.D.  [facility]  Medical Director [facility] Director [place] Department

## 2018-02-27 LAB — GLUCOSE, CAPILLARY
GLUCOSE-CAPILLARY: 200 mg/dL — AB (ref 70–99)
GLUCOSE-CAPILLARY: 189 mg/dL — AB (ref 70–99)
Glucose-Capillary: 145 mg/dL — ABNORMAL HIGH (ref 70–99)
Glucose-Capillary: 238 mg/dL — ABNORMAL HIGH (ref 70–99)

## 2018-02-27 LAB — BASIC METABOLIC PANEL
Anion gap: 10 (ref 5–15)
BUN: 47 mg/dL — ABNORMAL HIGH (ref 8–23)
CO2: 28 mmol/L (ref 22–32)
Calcium: 8.8 mg/dL — ABNORMAL LOW (ref 8.9–10.3)
Chloride: 98 mmol/L (ref 98–111)
Creatinine, Ser: 2.02 mg/dL — ABNORMAL HIGH (ref 0.44–1.00)
GFR calc Af Amer: 27 mL/min — ABNORMAL LOW (ref >60)
GFR calc non Af Amer: 23 mL/min — ABNORMAL LOW (ref >60)
Glucose, Bld: 149 mg/dL — ABNORMAL HIGH (ref 70–99)
Potassium: 4.2 mmol/L (ref 3.5–5.1)
Sodium: 136 mmol/L (ref 135–145)

## 2018-02-27 LAB — CULTURE, BLOOD (ROUTINE X 2): Special Requests: ADEQUATE

## 2018-02-27 NOTE — Progress Notes (Addendum)
* [facility]     Assessment and Plan:  Acute hypoxic and hypercapnic respiratory failure. Obesity hypoventilation syndrome. COPD.  Continue inhaled steroids and bronchodilators.  Given her numerous admissions over the past year I think she would benefit from home BiPAP at a pressure of 14/7.  She remains at high risk of recurrent exacerbation and recurrent admissions, death.-- Discharge planning consulted for home bipap.     Date: 02/27/2018  MRN# [id number] Katherine [name] 03-03-1940   Katherine [name] is a 78 y.o. old female seen in follow up for chief complaint of  Chief Complaint  Patient presents with  . Chest Pain     HPI:   The patient is a 78 year old female, she is followed in the outpatient setting by Dr. Jamal Collin, she was noted to have chronic hypoxic respiratory failure with morbid obesity and severe dyspnea on exertion.  She has been recurrently admitted to the hospital multiple issues including COPD exacerbation, acute kidney injury pneumonia, bilateral lower extremity DVT.  She was admitted to the hospital 16 times in 2019. Most recently she came into the hospital from [place] on 02/24/2018.  She is status post a recent course of antibiotics for pneumonia she was treated with duo nebs, Solu-Medrol, Lasix, with progressive dyspnea exertion therefore placed on BiPAP and transferred to ICU.[name]  She was continued on IV and inhaled steroids, nebulized bronchodilators. ABG shows 7.47/45/70 8/32.8.  BMP shows elevated creatinine of 2.02.  MRSA PCR negative, blood cultures negative other than coag negative staph.  PCR shows MRSE.  Chest x-ray 02/24/2018>> elevated right diaphragm, cardiomegaly, bibasilar atelectasis.  Lungs are otherwise unremarkable.  Medication:    Current Facility-Administered Medications:  .  0.9 %  sodium chloride infusion, 250 mL, Intravenous, PRN, Dustin Flock, MD .  acetaminophen (TYLENOL) tablet 650 mg, 650 mg, Oral, Q6H  PRN, 650 mg at 02/27/18 0308 **OR** acetaminophen (TYLENOL) suppository 650 mg, 650 mg, Rectal, Q6H PRN, Dustin Flock, MD .  acetaminophen (TYLENOL) tablet 650 mg, 650 mg, Oral, TID, Dustin Flock, MD, 650 mg at 02/27/18 0955 .  albuterol (PROVENTIL) (2.5 MG/3ML) 0.083% nebulizer solution 2.5 mg, 2.5 mg, Nebulization, Q4H PRN, Flora Lipps, MD, 2.5 mg at 02/27/18 0956 .  atorvastatin (LIPITOR) tablet 80 mg, 80 mg, Oral, QPM, Dustin Flock, MD, 80 mg at 02/26/18 1807 .  [COMPLETED] azithromycin (ZITHROMAX) tablet 500 mg, 500 mg, Oral, Once, 500 mg at 02/25/18 1759 **FOLLOWED BY** azithromycin (ZITHROMAX) tablet 250 mg, 250 mg, Oral, q1800, Flora Lipps, MD, 250 mg at 02/26/18 1806 .  cholecalciferol (VITAMIN D) tablet 1,000 Units, 1,000 Units, Oral, Daily, Dustin Flock, MD, 1,000 Units at 02/27/18 0955 .  clopidogrel (PLAVIX) tablet 75 mg, 75 mg, Oral, Daily, Dustin Flock, MD, 75 mg at 02/27/18 0819 .  diltiazem (CARDIZEM SR) 12 hr capsule 120 mg, 120 mg, Oral, BID, Dustin Flock, MD, 120 mg at 02/27/18 0821 .  enoxaparin (LOVENOX) injection 40 mg, 40 mg, Subcutaneous, BID, Dustin Flock, MD, 40 mg at 02/27/18 3532 .  FLUoxetine (PROZAC) capsule 20 mg, 20 mg, Oral, Daily, Dustin Flock, MD, 20 mg at 02/27/18 0820 .  fluticasone furoate-vilanterol (BREO ELLIPTA) 100-25 MCG/INH 1 puff, 1 puff, Inhalation, Daily, Kasa, Kurian, MD, 1 puff at 02/27/18 0823 .  furosemide (LASIX) tablet 20 mg, 20 mg, Oral, Daily, Dustin Flock, MD, 20 mg at 02/27/18 0819 .  gabapentin (NEURONTIN) capsule 300 mg, 300 mg, Oral, QHS, Dustin Flock, MD, 300 mg at 02/26/18 2107 .  guaiFENesin (MUCINEX) 12 hr  tablet 600 mg, 600 mg, Oral, BID, Dustin Flock, MD, 600 mg at 02/27/18 2683 .  hydrALAZINE (APRESOLINE) tablet 25 mg, 25 mg, Oral, TID, Dustin Flock, MD, 25 mg at 02/27/18 0820 .  insulin aspart (novoLOG) injection 0-9 Units, 0-9 Units, Subcutaneous, TID WC, Dustin Flock, MD, 1 Units at 02/27/18  0818 .  ipratropium (ATROVENT) 0.03 % nasal spray 2 spray, 2 spray, Each Nare, BID, Dustin Flock, MD, 2 spray at 02/27/18 [phone] .  levETIRAcetam (KEPPRA XR) 24 hr tablet 500 mg, 500 mg, Oral, Daily, Dustin Flock, MD, 500 mg at 02/27/18 0820 .  linagliptin (TRADJENTA) tablet 5 mg, 5 mg, Oral, Daily, Dustin Flock, MD, 5 mg at 02/27/18 0819 .  loratadine (CLARITIN) tablet 10 mg, 10 mg, Oral, Daily, Dustin Flock, MD, 10 mg at 02/27/18 0820 .  LORazepam (ATIVAN) injection 1 mg, 1 mg, Intramuscular, PRN, Dustin Flock, MD .  ondansetron (ZOFRAN) tablet 4 mg, 4 mg, Oral, Q6H PRN **OR** ondansetron (ZOFRAN) injection 4 mg, 4 mg, Intravenous, Q6H PRN, Dustin Flock, MD .  pantoprazole (PROTONIX) EC tablet 40 mg, 40 mg, Oral, Daily, Dustin Flock, MD, 40 mg at 02/27/18 0820 .  predniSONE (DELTASONE) tablet 40 mg, 40 mg, Oral, Q breakfast, Kasa, Kurian, MD, 40 mg at 02/27/18 0819 .  sodium chloride flush (NS) 0.9 % injection 3 mL, 3 mL, Intravenous, Q12H, Dustin Flock, MD, 3 mL at 02/27/18 0823 .  sodium chloride flush (NS) 0.9 % injection 3 mL, 3 mL, Intravenous, PRN, Dustin Flock, MD .  umeclidinium bromide (INCRUSE ELLIPTA) 62.5 MCG/INH 1 puff, 1 puff, Inhalation, Daily, Kasa, Kurian, MD, 1 puff at 02/27/18 2229   Allergies:  Bee venom and Enalapril maleate   Review of Systems:  Constitutional: Feels well. Cardiovascular: Denies chest pain, exertional chest pain.  Pulmonary: Denies hemoptysis, pleuritic chest pain.   The remainder of systems were reviewed and were found to be negative other than what is documented in the HPI.    Physical Examination:   VS: BP 118/70 (BP Location: Right Arm)   Pulse 88   Temp 98.2 F (36.8 C) (Oral)   Resp 12   Ht 5\' 2"  (1.575 m)   Wt 117.9 kg   SpO2 95%   BMI 47.54 kg/m   General Appearance: No distress  Neuro:without focal findings, mental status, speech normal, alert and oriented HEENT: PERRLA, EOM intact Pulmonary: No  wheezing, No rales  CardiovascularNormal S1,S2.  No m/r/g.  Abdomen: Benign, Soft, non-tender, No masses Renal:  No costovertebral tenderness  GU:  No performed at this time. Endoc: No evident thyromegaly, no signs of acromegaly or Cushing features Skin:   warm, no rashes, no ecchymosis  Extremities: normal, no cyanosis, clubbing.     LABORATORY PANEL:   CBC Recent Labs  Lab 02/26/18 0216  WBC 10.9*  HGB 8.9*  HCT 28.3*  PLT 223   ------------------------------------------------------------------------------------------------------------------  Chemistries  Recent Labs  Lab 02/27/18 0438  NA 136  K 4.2  CL 98  CO2 28  GLUCOSE 149*  BUN 47*  CREATININE 2.02*  CALCIUM 8.8*   ------------------------------------------------------------------------------------------------------------------  Cardiac Enzymes Recent Labs  Lab 02/25/18 0429  TROPONINI <0.03   ------------------------------------------------------------  RADIOLOGY:   No results found for this or any previous visit. Results for orders placed during the hospital encounter of 11/29/17  DG Chest 2 View   Narrative CLINICAL DATA:  78 year old with COPD exacerbation. Shortness of breath.  EXAM: CHEST - 2 VIEW  COMPARISON:  11/29/2017 and 10/26/2017  FINDINGS: Coarse lung markings appear chronic. Linear and bandlike densities in left lower chest are similar to the recent comparison exam and most compatible with atelectasis. The upper lungs are clear. Slightly improved aeration at the right lung base. Heart size is within normal limits and stable. Atherosclerotic calcifications at the aortic arch. Negative for a pneumothorax. No acute bone abnormality. No significant pleural fluid.  IMPRESSION: 1. Persistent densities in left lower chest. Findings are most compatible with atelectasis. 2. Slightly improved aeration at the right lung base. 3. Chronic lung changes.   Electronically Signed    By: Markus Daft M.D.   On: 12/01/2017 09:03    ------------------------------------------------------------------------------------------------------------------  Thank  you for allowing [facility] to assist in the care of your patient. Our recommendations are noted above.  Please contact us if we can be of further service.   Marda Stalker, M.D., F.C.C.P.  Board Certified in Internal Medicine, Pulmonary Medicine, [place], and Sleep Medicine.  [facility] Office Number: [phone]  02/27/2018

## 2018-02-27 NOTE — Care Management Important Message (Signed)
Copy of signed Medicare IM left with patient in room. 

## 2018-02-27 NOTE — Progress Notes (Signed)
[facility] [place] NAME: Katherine [name]    MR#:  [id number]  DATE OF BIRTH:  08/09/1940  SUBJECTIVE:  CHIEF COMPLAINT:   Chief Complaint  Patient presents with  . Chest Pain   No new complaints.  Patient reports improvement in shortness of breath.  Did have some shortness of breath and wheezing requiring breathing treatments.  Seen by pulmonologist today.  REVIEW OF SYSTEMS:  Review of Systems  Constitutional: Negative for chills and fever.  HENT: Negative for hearing loss and tinnitus.   Eyes: Negative for blurred vision and double vision.  Respiratory: Positive for shortness of breath. Negative for cough.   Cardiovascular: Negative for chest pain and palpitations.  Gastrointestinal: Negative for heartburn.  Genitourinary: Negative for dysuria.  Musculoskeletal: Negative for myalgias and neck pain.  Skin: Negative for itching and rash.  Neurological: Negative for dizziness and headaches.  Psychiatric/Behavioral: Negative for depression and hallucinations.    DRUG ALLERGIES:   Allergies  Allergen Reactions  . Bee Venom Swelling  . Enalapril Maleate Swelling   VITALS:  Blood pressure 118/70, pulse 88, temperature 98.2 F (36.8 C), temperature source Oral, resp. rate 12, height 5\' 2"  (1.575 m), weight 117.9 kg, SpO2 95 %. PHYSICAL EXAMINATION:  GENERAL:off biPAP, on oxygen via nasal cannula, morbidly obese  HEAD: Normocephalic, atraumatic.  EYES: Pupils equal, round, reactive to light.  No scleral icterus.  MOUTH: Moist mucosal membrane. NECK: Supple. No thyromegaly. No nodules. No JVD.  PULMONARY: +rhonchi, +wheezing CARDIOVASCULAR: S1 and S2. Regular rate and rhythm. No murmurs, rubs, or gallops.  GASTROINTESTINAL: Soft, nontender, -distended. No masses. Positive bowel sounds. No hepatosplenomegaly.  MUSCULOSKELETAL: No swelling, clubbing, or edema.  NEUROLOGIC: alert and awake.  No focal  deficits SKIN:intact,warm,dry  LABORATORY PANEL:  Female CBC Recent Labs  Lab 02/26/18 0216  WBC 10.9*  HGB 8.9*  HCT 28.3*  PLT 223   ------------------------------------------------------------------------------------------------------------------ Chemistries  Recent Labs  Lab 02/27/18 0438  NA 136  K 4.2  CL 98  CO2 28  GLUCOSE 149*  BUN 47*  CREATININE 2.02*  CALCIUM 8.8*   RADIOLOGY:  No results found. ASSESSMENT AND PLAN:    1.  Acute on chronic respiratory failure due to acute on chronic COPD exasperation Patient on IV Solu-Medrol Patient initiall iy required BiPAP but now on nasal cannula.  IV steroids.  Antibiotics with azithromycin Significantlymproved clinically. Patient seen by pulmonologist today.  Recommendation is to continue inhaled steroid and bronchodilators.  Given patient's numerous admissions over the last 1 year recommendation was to set up home BiPAP at a pressure of 14/7 since patient remains high risk for recurrent exacerbation and recurrent admissions as well as death.  Discharge planning to set up home BiPAP  Chronic hypoxic respiratory failure. Patient on 2 L of home oxygen therapy at home. Continue oxygen supplementation.  2.  History of congestive heart failure currently compensated continue oral Lasix  3.  Diabetes type 2 Place her on sliding scale insulin monitor blood sugars  4.  Previous history of CVA continue Plavix  5.  Hypertension continue hydralazine  6.  History of seizure continue Keppra  7.  Morbid obesity with a BMI of 39.05. Encourage lifestyle modification including weight loss. Patient seen by physical therapist.  Recommendation was for skilled nursing facility placement on discharge.  Social worker to assist with discharge planning   DVT prophylaxis; Lovenox  All the records are reviewed and case discussed with Care Management/Social Worker. Management  plans discussed with the patient, family and they are  in agreement.  CODE STATUS: Full Code  TOTAL TIME TAKING CARE OF THIS PATIENT: 32 minutes.   More than 50% of the time was spent in counseling/coordination of care: YES  POSSIBLE D/C IN 1-2 DAYS, DEPENDING ON CLINICAL CONDITION.   Kevion Fatheree M.D on 02/27/2018 at 2:17 PM  Between 7am to 6pm - Pager - [phone]  After 6pm go to [email or web address] - Proofreader  [facility]  Office  [phone]  CC: Primary care physician; Valerie Roys, DO  Note: This dictation was prepared with Dragon dictation along with smaller phrase technology. Any transcriptional errors that result from this process are unintentional.

## 2018-02-27 NOTE — Evaluation (Signed)
Physical Therapy Evaluation Patient Details Name: Katherine [name] MRN: [id number] DOB: 1940/12/27 Today's Date: 02/27/2018   History of Present Illness  Pt is a 78 y/o F who presented with SOB and initially required BiPAP.  Pt on 2L O2 crhonically.  Pt's PMH includes COPD, seizures, stroke, CHF, R TKA.      Clinical Impression  Pt admitted with above diagnosis. Pt currently with functional limitations due to the deficits listed below (see PT Problem List). Ms. Proud reports dizziness with all OOB activity that has been ongoing for ~2 years.  This limited her mobility today as well with her dizziness reaching 9/10 on her third attempt with sit>stand and ultimately required assist to quickly return to supine where her symptoms improved.  Unable to capture orthostatics as pt only able to tolerate standing for a max of 10 seconds, even after 3 attempts.  Given pt's current mobility status, recommending SNF at d/c.   Pt will benefit from skilled PT to increase their independence and safety with mobility to allow discharge to the venue listed below.      Follow Up Recommendations SNF    Equipment Recommendations  Other (comment)(TBD at next venue of care)    Recommendations for Other Services       Precautions / Restrictions Precautions Precautions: Fall;Other (comment) Precaution Comments: up to 9/10 dizzy with OOB activity Restrictions Weight Bearing Restrictions: No      Mobility  Bed Mobility Overal bed mobility: Needs Assistance Bed Mobility: Supine to Sit;Sit to Supine     Supine to sit: Min guard;HOB elevated Sit to supine: Max assist;+2 for physical assistance   General bed mobility comments: Pt requires increased time with HOB elevated and use of bed rail for supine>sit.  Max +2 assist to return to supine as pt needed to lie down quickly due to 9/10 dizziness that pt reported was worsening.   Transfers Overall transfer level: Needs assistance Equipment used: Rolling walker  (2 wheeled) Transfers: Sit to/from Stand Sit to Stand: Mod assist;+2 physical assistance;From elevated surface         General transfer comment: Bed elevated and assist to boost to standing.  Pt stood from bed x3 but with each attempt became immediately dizzy, up to 9/10 and returned to sitting.  On the third attempt once pt return to sitting her dizziness did not improve so she was assisted back to supine.   Ambulation/Gait             General Gait Details: Not safe to attempt at this time  Stairs            Wheelchair Mobility    Modified Rankin (Stroke Patients Only)       Balance Overall balance assessment: Needs assistance;History of Falls Sitting-balance support: No upper extremity supported;Feet supported Sitting balance-Leahy Scale: Good     Standing balance support: Bilateral upper extremity supported;During functional activity Standing balance-Leahy Scale: Poor Standing balance comment: Pt unable to maintain static standing due to dizziness.  Relies on BUE support.                              Pertinent Vitals/Pain Pain Assessment: No/denies pain    Home Living Family/patient expects to be discharged to:: Skilled nursing facility                      Prior Function Level of Independence: Needs assistance   Gait / Transfers  Assistance Needed: Pt reports "they will not let me walk because of my dizziness".  Pt reports that if she is not dizzy she is able to ambulate short household distances.  2 recent falls.  Pt has never received an explanation for her dizziness and it has been going on for ~2 years.   ADL's / Homemaking Assistance Needed: Assist for sponge bathing.  Pt ind with dressing.  Meals provided by facility.          Hand Dominance        Extremity/Trunk Assessment   Upper Extremity Assessment Upper Extremity Assessment: (BUE strength grossly 4/5)    Lower Extremity Assessment Lower Extremity Assessment:  Generalized weakness       Communication   Communication: HOH  Cognition Arousal/Alertness: Awake/alert Behavior During Therapy: WFL for tasks assessed/performed Overall Cognitive Status: Within Functional Limits for tasks assessed                                        General Comments General comments (skin integrity, edema, etc.): SpO2 remains at or above 90% on 2L O2 throughout session.  BP taken in sitting before sit>stand 109/64.  Started taking BP in standing but pt unable to tolerate and sat amidst BP taken reading 96/83.  BP in supine at end of session 103/62 with pt reported dizziness resolved.     Exercises     Assessment/Plan    PT Assessment Patient needs continued PT services  PT Problem List Decreased strength;Decreased activity tolerance;Decreased balance;Decreased mobility;Decreased knowledge of use of DME;Decreased safety awareness       PT Treatment Interventions DME instruction;Gait training;Functional mobility training;Therapeutic activities;Therapeutic exercise;Balance training;Neuromuscular re-education;Patient/family education    PT Goals (Current goals can be found in the Care Plan section)  Acute Rehab PT Goals Patient Stated Goal: to be able to walk PT Goal Formulation: With patient Time For Goal Achievement: 03/13/18 Potential to Achieve Goals: Good    Frequency Min 2X/week   Barriers to discharge Other (comment) Pt unable to receive the amount of assist she requires from family at d/c    Co-evaluation               AM-PAC PT "6 Clicks" Mobility  Outcome Measure Help needed turning from your back to your side while in a flat bed without using bedrails?: A Little Help needed moving from lying on your back to sitting on the side of a flat bed without using bedrails?: A Little Help needed moving to and from a bed to a chair (including a wheelchair)?: Total Help needed standing up from a chair using your arms (e.g.,  wheelchair or bedside chair)?: A Lot Help needed to walk in hospital room?: Total Help needed climbing 3-5 steps with a railing? : Total 6 Click Score: 11    End of Session Equipment Utilized During Treatment: Gait belt Activity Tolerance: Treatment limited secondary to medical complications (Comment)(dizziness) Patient left: in bed;with call bell/phone within reach;with bed alarm set Nurse Communication: Mobility status;Other (comment)(dizziness, BP readings, SpO2) PT Visit Diagnosis: Dizziness and giddiness (R42);Difficulty in walking, not elsewhere classified (R26.2);Unsteadiness on feet (R26.81);Muscle weakness (generalized) (M62.81);History of falling (Z91.81)    Time: 1021-1050 PT Time Calculation (min) (ACUTE ONLY): 29 min   Charges:   PT Evaluation $PT Eval Moderate Complexity: 1 Mod PT Treatments $Therapeutic Activity: 8-22 mins        Collie Siad PT,  DPT 02/27/2018, 1:27 PM

## 2018-02-27 NOTE — Clinical Social Work Note (Addendum)
Patient is a long term care resident at [facility].  Plan to discharge back to SNF.  CSW attempted to see patient to complete assessment, but she was sleeping CSW to see patient at a later time.   Abeln Broom. [place], MSW, [place]  02/27/2018 12:11 PM

## 2018-02-28 ENCOUNTER — Inpatient Hospital Stay: Payer: Medicare HMO

## 2018-02-28 LAB — URINALYSIS, ROUTINE W REFLEX MICROSCOPIC
Bilirubin Urine: NEGATIVE
Glucose, UA: NEGATIVE mg/dL
HGB URINE DIPSTICK: NEGATIVE
Ketones, ur: NEGATIVE mg/dL
Leukocytes, UA: NEGATIVE
Nitrite: NEGATIVE
Protein, ur: NEGATIVE mg/dL
Specific Gravity, Urine: 1.012 (ref 1.005–1.030)
pH: 6 (ref 5.0–8.0)

## 2018-02-28 LAB — BASIC METABOLIC PANEL
Anion gap: 9 (ref 5–15)
BUN: 46 mg/dL — ABNORMAL HIGH (ref 8–23)
CALCIUM: 8.6 mg/dL — AB (ref 8.9–10.3)
CO2: 27 mmol/L (ref 22–32)
Chloride: 100 mmol/L (ref 98–111)
Creatinine, Ser: 1.73 mg/dL — ABNORMAL HIGH (ref 0.44–1.00)
GFR calc Af Amer: 33 mL/min — ABNORMAL LOW (ref >60)
GFR calc non Af Amer: 28 mL/min — ABNORMAL LOW (ref >60)
Glucose, Bld: 99 mg/dL (ref 70–99)
Potassium: 4.6 mmol/L (ref 3.5–5.1)
Sodium: 136 mmol/L (ref 135–145)

## 2018-02-28 LAB — MAGNESIUM: Magnesium: 2 mg/dL (ref 1.7–2.4)

## 2018-02-28 LAB — GLUCOSE, CAPILLARY
GLUCOSE-CAPILLARY: 148 mg/dL — AB (ref 70–99)
Glucose-Capillary: 96 mg/dL (ref 70–99)

## 2018-02-28 MED ORDER — PREDNISONE 20 MG PO TABS: ORAL_TABLET | ORAL | 0 refills | Status: DC

## 2018-02-28 MED ORDER — AZITHROMYCIN 250 MG PO TABS: 250.0000 mg | ORAL_TABLET | Freq: Every day | ORAL | 0 refills | Status: DC

## 2018-02-28 MED ORDER — LORAZEPAM 2 MG/ML IJ SOLN: 1.0000 mg | INTRAMUSCULAR | 0 refills | Status: DC | PRN

## 2018-02-28 NOTE — NC FL2 (Signed)
[place] LEVEL OF CARE SCREENING TOOL     IDENTIFICATION  Patient Name: Katherine [name] Birthdate: 02/23/1940 Sex: female Admission Date (Current Location): 02/24/2018  [place] and [place] Number:  [name] [id number] [place] and Address:  [facility], [address], [place], [place] [address]      Provider Number: [id number]  Attending Physician Name and Address:  Otila Back, MD  Relative Name and Phone Number:  [name] [phone]  [phone] or Bonnye Fava   [phone] or [place] Other   [phone]     Current Level of Care: Hospital Recommended Level of Care: [place] Prior Approval Number:    Date Approved/Denied:   PASRR Number: [id number]  Discharge Plan: SNF    Current Diagnoses: Patient Active Problem List   Diagnosis Date Noted  . Acute on chronic congestive heart failure ([place])   . COPD with acute exacerbation ([place]) 02/24/2018  . Chronic diastolic heart failure ([place]) 12/31/2017  . Lymphedema 12/31/2017  . COPD exacerbation ([place]) 11/30/2017  . Urinary tract infection 11/22/2017  . 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[place]) 11/16/2017  . Diabetes mellitus type 2, uncomplicated ([place]) 44/81/8563  . Pressure injury of skin 10/29/2017  . Acute kidney injury superimposed on CKD ([place]) 10/24/2017  . Severe sepsis ([place]) 10/24/2017  . Possible Seizures ([place]) 10/08/2017  . Hyperlipemia 10/08/2017  . 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 Stroke-like episode ([place]) s/p IV tpa 10/04/2017  . Palliative care by specialist   . Elevated rheumatoid factor 09/05/2017  . Frequent hospital admissions 08/22/2017  . Aphasia 06/28/2017  . Hand pain 03/21/2017  . Dry skin 03/21/2017  . Pain in finger of left hand 09/12/2016  . Chronic fatigue 06/12/2016  . Left knee pain 06/12/2016  . Goals of care, counseling/discussion 03/13/2016  . Primary localized  osteoarthritis of right knee 02/08/2016  . OSA (obstructive sleep apnea) 09/16/2015  . Rotator cuff syndrome 09/07/2015  . 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 Sleep disturbance 04/14/2015  . Coronary artery disease 03/14/2015  . 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 Acute respiratory failure with hypoxia ([place]) 09/16/2014  . 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 Anxiety   . Tobacco abuse   . Prurigo nodularis   . GERD (gastroesophageal reflux disease)   . COPD (chronic obstructive pulmonary disease) ([place])   . Osteoarthritis   . Vitamin D deficiency disease   . Chronic constipation   . CKD (chronic kidney disease), stage III ([place])   . Essential hypertension 05/20/2013  . Morbid obesity ([place]) 05/20/2013    Orientation RESPIRATION BLADDER Height & Weight     Self, Time, Situation, Place  O2(2L) Incontinent Weight: 263 lb 8 oz (119.5 kg) Height:  5\' 2"  (157.5 cm)  BEHAVIORAL SYMPTOMS/MOOD NEUROLOGICAL BOWEL NUTRITION STATUS      Continent Diet(2g sodium diet)  AMBULATORY STATUS COMMUNICATION OF NEEDS Skin   Limited Assist Verbally Normal                       Personal Care Assistance Level of Assistance  Bathing, Feeding, Dressing Bathing Assistance: Limited assistance Feeding assistance: Independent Dressing Assistance: Limited assistance     Functional Limitations Info  Sight, Speech, Hearing Sight Info: Adequate Hearing Info: Adequate Speech Info: Adequate    SPECIAL CARE FACTORS FREQUENCY  PT (By licensed PT), OT (By licensed OT)     PT Frequency: 5x a week OT Frequency: 5x a week  Contractures Contractures Info: Not present    Additional Factors Info  Code Status, Allergies, Psychotropic, Insulin Sliding Scale Code Status Info: Full Code Allergies Info: BEE VENOM, ENALAPRIL MALEATE Psychotropic Info: FLUoxetine (PROZAC) capsule 20 mg  Insulin Sliding Scale Info: insulin aspart (novoLOG) injection 0-9 Units 3x a day with meals       Current  Medications (02/28/2018):  This is the current hospital active medication list Current Facility-Administered Medications  Medication Dose Route Frequency Provider Last Rate Last Dose  . 0.9 %  sodium chloride infusion  250 mL Intravenous PRN Dustin Flock, MD      . acetaminophen (TYLENOL) tablet 650 mg  650 mg Oral Q6H PRN Dustin Flock, MD   650 mg at 02/27/18 0308   Or  . acetaminophen (TYLENOL) suppository 650 mg  650 mg Rectal Q6H PRN Dustin Flock, MD      . acetaminophen (TYLENOL) tablet 650 mg  650 mg Oral TID Dustin Flock, MD   650 mg at 02/28/18 0848  . albuterol (PROVENTIL) (2.5 MG/3ML) 0.083% nebulizer solution 2.5 mg  2.5 mg Nebulization Q4H PRN Flora Lipps, MD   2.5 mg at 02/27/18 0956  . atorvastatin (LIPITOR) tablet 80 mg  80 mg Oral QPM Dustin Flock, MD   80 mg at 02/27/18 1659  . azithromycin (ZITHROMAX) tablet 250 mg  250 mg Oral [id number] Flora Lipps, MD   250 mg at 02/27/18 1659  . cholecalciferol (VITAMIN D) tablet 1,000 Units  1,000 Units Oral Daily Dustin Flock, MD   1,000 Units at 02/28/18 0848  . clopidogrel (PLAVIX) tablet 75 mg  75 mg Oral Daily Dustin Flock, MD   75 mg at 02/28/18 0847  . diltiazem (CARDIZEM SR) 12 hr capsule 120 mg  120 mg Oral BID Dustin Flock, MD   120 mg at 02/28/18 0848  . enoxaparin (LOVENOX) injection 40 mg  40 mg Subcutaneous BID Dustin Flock, MD   40 mg at 02/28/18 0848  . FLUoxetine (PROZAC) capsule 20 mg  20 mg Oral Daily Dustin Flock, MD   20 mg at 02/28/18 0849  . fluticasone furoate-vilanterol (BREO ELLIPTA) 100-25 MCG/INH 1 puff  1 puff Inhalation Daily Flora Lipps, MD   1 puff at 02/28/18 0849  . furosemide (LASIX) tablet 20 mg  20 mg Oral Daily Dustin Flock, MD   20 mg at 02/28/18 0848  . gabapentin (NEURONTIN) capsule 300 mg  300 mg Oral QHS Dustin Flock, MD   300 mg at 02/27/18 2136  . guaiFENesin (MUCINEX) 12 hr tablet 600 mg  600 mg Oral BID Dustin Flock, MD   600 mg at 02/28/18 0847  .  hydrALAZINE (APRESOLINE) tablet 25 mg  25 mg Oral TID Dustin Flock, MD   25 mg at 02/28/18 0848  . insulin aspart (novoLOG) injection 0-9 Units  0-9 Units Subcutaneous TID WC Dustin Flock, MD   2 Units at 02/27/18 1656  . ipratropium (ATROVENT) 0.03 % nasal spray 2 spray  2 spray Each Nare BID Dustin Flock, MD   2 spray at 02/28/18 0849  . levETIRAcetam (KEPPRA XR) 24 hr tablet 500 mg  500 mg Oral Daily Dustin Flock, MD   500 mg at 02/28/18 0848  . linagliptin (TRADJENTA) tablet 5 mg  5 mg Oral Daily Dustin Flock, MD   5 mg at 02/28/18 0847  . loratadine (CLARITIN) tablet 10 mg  10 mg Oral Daily Dustin Flock, MD   10 mg at 02/28/18 0848  . LORazepam (ATIVAN) injection 1 mg  1 mg Intramuscular PRN Dustin Flock, MD      . ondansetron [facility]) tablet 4 mg  4 mg Oral Q6H PRN Dustin Flock, MD       Or  . ondansetron [facility]) injection 4 mg  4 mg Intravenous Q6H PRN Dustin Flock, MD   4 mg at 02/28/18 0859  . pantoprazole (PROTONIX) EC tablet 40 mg  40 mg Oral Daily Dustin Flock, MD   40 mg at 02/28/18 0847  . predniSONE (DELTASONE) tablet 40 mg  40 mg Oral Q breakfast Flora Lipps, MD   40 mg at 02/28/18 0847  . sodium chloride flush (NS) 0.9 % injection 3 mL  3 mL Intravenous Q12H Dustin Flock, MD   3 mL at 02/28/18 0850  . sodium chloride flush (NS) 0.9 % injection 3 mL  3 mL Intravenous PRN Dustin Flock, MD      . umeclidinium bromide (INCRUSE ELLIPTA) 62.5 MCG/INH 1 puff  1 puff Inhalation Daily Flora Lipps, MD   1 puff at 02/28/18 0900     Discharge Medications: Please see discharge summary for a list of discharge medications.  Relevant Imaging Results:  Relevant Lab Results:   Additional Information ss: [id number]  Ross Ludwig, LCSWA

## 2018-02-28 NOTE — Plan of Care (Signed)
Oxygen saturations stable on 2LNC.

## 2018-02-28 NOTE — Discharge Summary (Signed)
[facility] [place] NAME: Genora Arp    MR#:  [id number]  DATE OF BIRTH:  1940-12-31  DATE OF ADMISSION:  02/24/2018   ADMITTING PHYSICIAN: Dustin Flock, MD  DATE OF DISCHARGE: 02/28/2018  PRIMARY CARE PHYSICIAN: Valerie Roys, DO   ADMISSION DIAGNOSIS:  Pneumonia [J18.9] COPD exacerbation (HCC) [J44.1] Acute respiratory failure with hypoxia (HCC) [J96.01] HCAP (healthcare-associated pneumonia) [J18.9] Chest pain [R07.9] Acute on chronic congestive heart failure, unspecified heart failure type ([place]) [I50.9] Sepsis with acute hypoxic respiratory failure without septic shock, due to unspecified organism ([place]) [A41.9, R65.20, J96.01] DISCHARGE DIAGNOSIS:  Active Problems:   Acute respiratory failure with hypoxia ([place])   COPD with acute exacerbation (HCC)   Acute on chronic congestive heart failure ([place])  SECONDARY DIAGNOSIS:   Past Medical History:  Diagnosis Date  . Angioedema   . Anxiety   . Anxiety and depression   . CHF (congestive heart failure) ([place])   . CHF (congestive heart failure) ([place])   . Chronic constipation   . Chronic kidney disease    stage 3  . COPD (chronic obstructive pulmonary disease) ([place])   . Diabetes mellitus without complication ([place])   . Gallstones   . GERD (gastroesophageal reflux disease)   . Hypertension   . Left ventricular hypertrophy   . Osteoarthritis   . Prurigo nodularis   . Seizures ([place])   . Stroke ([place])   . Tobacco abuse   . Vitamin D deficiency disease    HOSPITAL COURSE:  Chief complaint;  HISTORY OF PRESENT ILLNESS: Aneya Daddona  is a 78 y.o. female with a known history of COPD on 2 L of oxygen, congestive heart failure, chronic kidney disease stage III, diabetes type 2, GERD, hypertension, osteoarthritis, previous history of stroke who presented from nursing home with shortness of breath.  Patient apparently was diagnosed with pneumonia and started on antibiotics  at the facility.  Finished course of antibiotics 1 day prior to admission .  Patient stated that her breathing was worse therefore came to the emergency room.  Had to be placed on BiPAP.  Patient's chest x-ray here is negative.  Please refer to the H&P dictated for further details.  HOSPITAL COURSE; 1.Acute on chronic respiratory failure due to acute on chronic COPD exasperation Patient was diagnosed with COPD exacerbation.  Placed on BiPAP.  Admitted to stepdown unit initially.  Placed on IV Solu-Medrol and empiric antibiotics with azithromycin.  PRN nebulizer treatments.  Patient significantly improved clinically.  Was weaned off BiPAP and placed on oxygen.  Patient uses 2 L of oxygen at nursing home.  Improved significantly.   Patient seen by pulmonologist during this admission .  Recommendation is to continue inhaled steroid and bronchodilators.  Given patient's numerous admissions over the last 1 year recommendation was to set up home BiPAP at a pressure of 14/7 since patient remains high risk for recurrent exacerbation and recurrent admissions as well as death.  Social work already assisted with communicating with nursing facility and will initiate use of BiPAP at night and while patient asleep with pressure of 14/7.  Discharge on p.o. prednisone taper p.o. biotics for a few days.  Chronic hypoxic respiratory failure. Patient on 2 L of home oxygen therapy at home. Continue oxygen supplementation.  2.History of congestive heart failure currently compensated continue oral Lasix  3.Diabetes type 2  Continue home regimen on discharge.  Outpatient monitoring by primary care physician.  4.Previous history of  CVA continue Plavix  5.Hypertension continue hydralazine  6.History of seizure continue Keppra  7.  Morbid obesity with a BMI of 39.05. Encourage lifestyle modification including weight loss.  8.  Chronic dizziness. Patient reports having chronic dizziness with nausea  and vomiting intermittently when she ambulates.  Has been going on for about a year.  Reported an episode of dizziness with vomiting x1 this morning after breakfast.  Stat CT scan of the head was done this morning which came back negative.  I went back to reevaluate patient and patient reports feeling back to baseline.  Resting comfortably.  Wishes to be discharged back to nursing facility today.   Patient seen by physical therapist.  Recommendation was for return to the skilled nursing facility on discharge    DISCHARGE CONDITIONS:  Stable CONSULTS OBTAINED:   DRUG ALLERGIES:   Allergies  Allergen Reactions  . Bee Venom Swelling  . Enalapril Maleate Swelling   DISCHARGE MEDICATIONS:   Allergies as of 02/28/2018      Reactions   Bee Venom Swelling   Enalapril Maleate Swelling      Medication List    STOP taking these medications   predniSONE 10 MG (21) Tbpk tablet Commonly known as:  STERAPRED UNI-PAK 21 TAB Replaced by:  predniSONE 20 MG tablet     TAKE these medications   acetaminophen 650 MG CR tablet Commonly known as:  TYLENOL Take 650 mg by mouth 3 (three) times daily.   albuterol 108 (90 Base) MCG/ACT inhaler Commonly known as:  PROVENTIL HFA;VENTOLIN HFA Inhale 2 puffs into the lungs every 4 (four) hours as needed for wheezing or shortness of breath.   albuterol 0.63 MG/3ML nebulizer solution Commonly known as:  ACCUNEB Take 3 mLs by nebulization every 8 (eight) hours as needed for wheezing.   atorvastatin 80 MG tablet Commonly known as:  LIPITOR Take 80 mg by mouth every evening.   azithromycin 250 MG tablet Commonly known as:  ZITHROMAX Take 1 tablet (250 mg total) by mouth daily.   cetirizine 10 MG tablet Commonly known as:  ZYRTEC Take 5 mg by mouth at bedtime.   cholecalciferol 1000 units tablet Commonly known as:  VITAMIN D Take 1,000 Units by mouth daily.   clopidogrel 75 MG tablet Commonly known as:  PLAVIX Take 75 mg by mouth daily.     diclofenac sodium 1 % Gel Commonly known as:  VOLTAREN Apply 2 g topically as needed (for left knee pain).   diltiazem 120 MG 12 hr capsule Commonly known as:  CARDIZEM SR Take 120 mg by mouth 2 (two) times daily.   docusate sodium 100 MG capsule Commonly known as:  COLACE Take 100 mg by mouth daily.   FLUoxetine 10 MG capsule Commonly known as:  PROZAC Take 20 mg by mouth daily.   Fluticasone-Umeclidin-Vilant 100-62.5-25 MCG/INH Aepb Inhale 1 puff into the lungs daily.   furosemide 20 MG tablet Commonly known as:  LASIX Take 1 tablet (20 mg total) by mouth daily.   gabapentin 300 MG capsule Commonly known as:  NEURONTIN Take 300 mg by mouth at bedtime.   guaiFENesin 600 MG 12 hr tablet Commonly known as:  MUCINEX Take 1 tablet (600 mg total) by mouth 2 (two) times daily.   hydrALAZINE 25 MG tablet Commonly known as:  APRESOLINE Take 25 mg by mouth 3 (three) times daily.   ipratropium 0.03 % nasal spray Commonly known as:  ATROVENT Place 2 sprays into both nostrils 2 (two) times daily.  ipratropium-albuterol 0.5-2.5 (3) MG/3ML Soln Commonly known as:  DUONEB Take 3 mLs by nebulization 2 (two) times daily.   levETIRAcetam 500 MG 24 hr tablet Commonly known as:  KEPPRA XR Take 1 tablet (500 mg total) by mouth daily.   linagliptin 5 MG Tabs tablet Commonly known as:  TRADJENTA Take 5 mg by mouth daily.   LORazepam 2 MG/ML injection Commonly known as:  ATIVAN Inject 0.5 mLs (1 mg total) into the muscle as needed for seizure.   meclizine 25 MG tablet Commonly known as:  ANTIVERT Take 25 mg by mouth every 12 (twelve) hours as needed for dizziness.   multivitamin with minerals Tabs tablet Take 1 tablet by mouth daily.   pantoprazole 40 MG tablet Commonly known as:  PROTONIX Take 40 mg by mouth daily.   predniSONE 20 MG tablet Commonly known as:  DELTASONE 40 mg p.o. daily x2 days Then 30 mg p.o. daily x2 days Then 20 mg p.o. daily x2 days Then 10 mg  p.o. daily x2 days and stop. Replaces:  predniSONE 10 MG (21) Tbpk tablet   vitamin C 500 MG tablet Commonly known as:  ASCORBIC ACID Take 500 mg by mouth 2 (two) times daily.   zinc sulfate 220 (50 Zn) MG capsule Take 220 mg by mouth daily.        DISCHARGE INSTRUCTIONS:   DIET:  1800 ADA diet.  Low sodium diet restriction.  1.5 L daily fluid restriction. DISCHARGE CONDITION:  Stable ACTIVITY:  Activity as tolerated OXYGEN:  Home Oxygen: Yes.    Oxygen Delivery: 2 liters/min via Patient connected to nasal cannula oxygen DISCHARGE LOCATION:  nursing home   If you experience worsening of your admission symptoms, develop shortness of breath, life threatening emergency, suicidal or homicidal thoughts you must seek medical attention immediately by calling 911 or calling your MD immediately  if symptoms less severe.  You Must read complete instructions/literature along with all the possible adverse reactions/side effects for all the Medicines you take and that have been prescribed to you. Take any new Medicines after you have completely understood and accpet all the possible adverse reactions/side effects.   Please note  You were cared for by a hospitalist during your hospital stay. If you have any questions about your discharge medications or the care you received while you were in the hospital after you are discharged, you can call the unit and asked to speak with the hospitalist on call if the hospitalist that took care of you is not available. Once you are discharged, your primary care physician will handle any further medical issues. Please note that NO REFILLS for any discharge medications will be authorized once you are discharged, as it is imperative that you return to your primary care physician (or establish a relationship with a primary care physician if you do not have one) for your aftercare needs so that they can reassess your need for medications and monitor your lab  values.    On the day of Discharge:  VITAL SIGNS:  Blood pressure 127/69, pulse 71, temperature 97.7 F (36.5 C), temperature source Oral, resp. rate 19, height 5\' 2"  (1.575 m), weight 119.5 kg, SpO2 100 %. PHYSICAL EXAMINATION:  GENERAL:  78 y.o.-year-old patient lying in the bed with no acute distress.  EYES: Pupils equal, round, reactive to light and accommodation. No scleral icterus. Extraocular muscles intact.  HEENT: Head atraumatic, normocephalic. Oropharynx and nasopharynx clear.  NECK:  Supple, no jugular venous distention. No thyroid enlargement,  no tenderness.  LUNGS: Normal breath sounds bilaterally, no wheezing, rales,rhonchi or crepitation. No use of accessory muscles of respiration.  CARDIOVASCULAR: S1, S2 normal. No murmurs, rubs, or gallops.  ABDOMEN: Soft, non-tender, non-distended. Bowel sounds present. No organomegaly or mass.  EXTREMITIES: No pedal edema, cyanosis, or clubbing.  NEUROLOGIC: Cranial nerves II through XII are intact. Muscle strength 5/5 in all extremities. Sensation intact. Gait not checked.  PSYCHIATRIC: The patient is alert and oriented x 3.  SKIN: No obvious rash, lesion, or ulcer.  DATA REVIEW:   CBC Recent Labs  Lab 02/26/18 0216  WBC 10.9*  HGB 8.9*  HCT 28.3*  PLT 223    Chemistries  Recent Labs  Lab 02/28/18 0723  NA 136  K 4.6  CL 100  CO2 27  GLUCOSE 99  BUN 46*  CREATININE 1.73*  CALCIUM 8.6*  MG 2.0     Microbiology Results  Results for orders placed or performed during the hospital encounter of 02/24/18  Blood Culture (routine x 2)     Status: Abnormal   Collection Time: 02/24/18  6:53 PM  Result Value Ref Range Status   Specimen Description   Final    BLOOD RIGHT UPPER ARM Performed at [facility], [address], [place], [place] [address]    Special Requests   Final    BOTTLES DRAWN AEROBIC AND ANAEROBIC Blood Culture adequate volume Performed at [facility], [address]  [place], [place] [address]    Culture  Setup Time   Final    GRAM POSITIVE COCCI IN BOTH AEROBIC AND ANAEROBIC BOTTLES CRITICAL RESULT CALLED TO, READ BACK BY AND VERIFIED WITH: JASON ROBBINS 02/25/2018 1553 KMP    Culture (A)  Final    STAPHYLOCOCCUS SPECIES (COAGULASE NEGATIVE) THE SIGNIFICANCE OF ISOLATING THIS ORGANISM FROM A SINGLE SET OF BLOOD CULTURES WHEN MULTIPLE SETS ARE DRAWN IS UNCERTAIN. PLEASE NOTIFY THE MICROBIOLOGY DEPARTMENT WITHIN ONE WEEK IF SPECIATION AND SENSITIVITIES ARE REQUIRED. Performed at [facility], [place] [address] [place], [place] [address]    Report Status 02/27/2018 FINAL  Final  Blood Culture ID Panel (Reflexed)     Status: Abnormal   Collection Time: 02/24/18  6:53 PM  Result Value Ref Range Status   Enterococcus species NOT DETECTED NOT DETECTED Final   Listeria monocytogenes NOT DETECTED NOT DETECTED Final   Staphylococcus species DETECTED (A) NOT DETECTED Final    Comment: Methicillin (oxacillin) resistant coagulase negative staphylococcus. Possible blood culture contaminant (unless isolated from more than one blood culture draw or clinical case suggests pathogenicity). No antibiotic treatment is indicated for blood  culture contaminants. CRITICAL RESULT CALLED TO, READ BACK BY AND VERIFIED WITH: JASON ROBINS AT 3244 02/25/2018 KMP    Staphylococcus aureus (BCID) NOT DETECTED NOT DETECTED Final   Methicillin resistance DETECTED (A) NOT DETECTED Final    Comment: CRITICAL RESULT CALLED TO, READ BACK BY AND VERIFIED WITH: JASON ROBBINS AT 0102 02/25/2018 KMP    Streptococcus species NOT DETECTED NOT DETECTED Final   Streptococcus agalactiae NOT DETECTED NOT DETECTED Final   Streptococcus pneumoniae NOT DETECTED NOT DETECTED Final   Streptococcus pyogenes NOT DETECTED NOT DETECTED Final   Acinetobacter baumannii NOT DETECTED NOT DETECTED Final   Enterobacteriaceae species NOT DETECTED NOT DETECTED Final   Enterobacter cloacae complex NOT DETECTED  NOT DETECTED Final   Escherichia coli NOT DETECTED NOT DETECTED Final   Klebsiella oxytoca NOT DETECTED NOT DETECTED Final   Klebsiella pneumoniae NOT DETECTED NOT DETECTED  Final   Proteus species NOT DETECTED NOT DETECTED Final   Serratia marcescens NOT DETECTED NOT DETECTED Final   Haemophilus influenzae NOT DETECTED NOT DETECTED Final   Neisseria meningitidis NOT DETECTED NOT DETECTED Final   Pseudomonas aeruginosa NOT DETECTED NOT DETECTED Final   Candida albicans NOT DETECTED NOT DETECTED Final   Candida glabrata NOT DETECTED NOT DETECTED Final   Candida krusei NOT DETECTED NOT DETECTED Final   Candida parapsilosis NOT DETECTED NOT DETECTED Final   Candida tropicalis NOT DETECTED NOT DETECTED Final    Comment: Performed at [facility], [address] [place], [place] [address]  Blood Culture (routine x 2)     Status: None (Preliminary result)   Collection Time: 02/24/18 10:10 PM  Result Value Ref Range Status   Specimen Description BLOOD RIGHT HAND  Final   Special Requests   Final    BOTTLES DRAWN AEROBIC AND ANAEROBIC Blood Culture adequate volume   Culture   Final    NO GROWTH 4 DAYS Performed at [facility], [address] [place], [place] [address]    Report Status PENDING  Incomplete  MRSA PCR Screening     Status: None   Collection Time: 02/24/18 11:04 PM  Result Value Ref Range Status   MRSA by PCR NEGATIVE NEGATIVE Final    Comment:        The GeneXpert MRSA Assay (FDA approved for NASAL specimens only), is one component of a comprehensive MRSA colonization surveillance program. It is not intended to diagnose MRSA infection nor to guide or monitor treatment for MRSA infections. Performed at [facility], [address], [place],  [address]     RADIOLOGY:  Ct Head Wo Contrast  Result Date: 02/28/2018 CLINICAL DATA:  Episodic vertigo. EXAM: CT HEAD WITHOUT CONTRAST TECHNIQUE: Contiguous axial images were obtained from  the base of the skull through the vertex without intravenous contrast. COMPARISON:  Head CT scan 11/10/2017 and 07/25/2008. Brain MRI 10/05/2017. FINDINGS: Brain: No evidence of acute infarction, hemorrhage, hydrocephalus, extra-axial collection or mass lesion/mass effect. Cortical atrophy and chronic microvascular ischemic change are stable in appearance compared to the most recent exam. Vascular: No hyperdense vessel or unexpected calcification. Skull: Intact.  No focal lesion. Sinuses/Orbits: Right mastoid effusion is unchanged. Smaller left mastoid effusion is improved. Otherwise negative. Other: None. IMPRESSION: No acute abnormality. Atrophy and chronic microvascular ischemic change. No change in a right mastoid effusion. Left mastoid effusion is improved compared to the most recent study. Electronically Signed   By: Inge Rise M.D.   On: 02/28/2018 10:12     Management plans discussed with the patient, family and they are in agreement.  CODE STATUS: Full Code   TOTAL TIME TAKING CARE OF THIS PATIENT: 39 minutes.    Neville Pauls M.D on 02/28/2018 at 11:59 AM  Between 7am to 6pm - Pager - [phone]  After 6pm go to [email or web address] - Proofreader  [facility]  Office  [phone]  CC: Primary care physician; Valerie Roys, DO   Note: This dictation was prepared with Dragon dictation along with smaller phrase technology. Any transcriptional errors that result from this process are unintentional.

## 2018-02-28 NOTE — Progress Notes (Signed)
Discharge report called to [facility] / iv and tele removed/ EMS called to transport

## 2018-02-28 NOTE — Clinical Social Work Note (Signed)
Clinical Social Work Assessment  Patient Details  Name: Katherine [name] MRN: [id number] Date of Birth: 06-Nov-1940  Date of referral:  02/28/18               Reason for consult:  Facility Placement                Permission sought to share information with:  Facility Sport and exercise psychologist, Family Supports Permission granted to share information::  Yes, Verbal Permission Granted  Name::     Katherine [name] [phone]  [phone] or [place] F[place]   [phone] or [place] Other   [phone]   Agency::  SNF admissions  Relationship::     Contact Information:     Housing/Transportation Living arrangements for the past 2 months:  [place] of Information:  Medical Team Patient Interpreter Needed:  None Criminal Activity/Legal Involvement Pertinent to Current Situation/Hospitalization:  No - Comment as needed Significant Relationships:  Adult Children, Spouse Lives with:  Facility Resident Do you feel safe going back to the place where you live?  Yes Need for family participation in patient care:  Yes (Comment)  Care giving concerns:  Patient reported to PT that she does not get up enough at SNF, and she would like to try to walk again.   Social Worker assessment / plan:  Patient is a 78 year old female who is a resident at [facility].  Patient was sleeping and did not want to wake up.  CSW completed assessment by reviewing patient's chart.  Patient has been living at [facility] for several months.  Patient would like to be in her own home again, but knows that she cannot based on medical chart.    Employment status:  Retired Nurse, adult PT Recommendations:  [place] / Referral to community resources:  [place]  Patient/Family's Response to care:  Patient is in agreement to returning back to SNF.  Patient/Family's Understanding of and Emotional Response to  Diagnosis, Current Treatment, and Prognosis:  Patient is aware of current treatment and prognosis.  Emotional Assessment Appearance:  Appears stated age Attitude/Demeanor/Rapport:    Affect (typically observed):  Appropriate Orientation:  Oriented to Self, Oriented to Place, Oriented to  Time, Oriented to Situation Alcohol / Substance use:  Not Applicable Psych involvement (Current and /or in the community):  No (Comment)  Discharge Needs  Concerns to be addressed:  Care Coordination Readmission within the last 30 days:  No Current discharge risk:  None Barriers to Discharge:  No Barriers Identified   Ross Ludwig, LCSWA 02/28/2018, 2:02 PM

## 2018-03-01 DIAGNOSIS — J9611 Chronic respiratory failure with hypoxia: Secondary | ICD-10-CM | POA: Diagnosis not present

## 2018-03-01 DIAGNOSIS — N183 Chronic kidney disease, stage 3 (moderate): Secondary | ICD-10-CM | POA: Diagnosis not present

## 2018-03-01 DIAGNOSIS — J449 Chronic obstructive pulmonary disease, unspecified: Secondary | ICD-10-CM | POA: Diagnosis not present

## 2018-03-01 DIAGNOSIS — I509 Heart failure, unspecified: Secondary | ICD-10-CM | POA: Diagnosis not present

## 2018-03-01 LAB — CULTURE, BLOOD (ROUTINE X 2)
Culture: NO GROWTH
Special Requests: ADEQUATE

## 2018-03-04 ENCOUNTER — Inpatient Hospital Stay: Payer: Medicare HMO | Admitting: Family Medicine

## 2018-03-05 DIAGNOSIS — G4733 Obstructive sleep apnea (adult) (pediatric): Secondary | ICD-10-CM | POA: Diagnosis not present

## 2018-03-05 DIAGNOSIS — I509 Heart failure, unspecified: Secondary | ICD-10-CM | POA: Diagnosis not present

## 2018-03-05 DIAGNOSIS — J9611 Chronic respiratory failure with hypoxia: Secondary | ICD-10-CM | POA: Diagnosis not present

## 2018-03-05 DIAGNOSIS — J449 Chronic obstructive pulmonary disease, unspecified: Secondary | ICD-10-CM | POA: Diagnosis not present

## 2018-03-06 DIAGNOSIS — J449 Chronic obstructive pulmonary disease, unspecified: Secondary | ICD-10-CM | POA: Diagnosis not present

## 2018-03-07 DIAGNOSIS — D649 Anemia, unspecified: Secondary | ICD-10-CM | POA: Diagnosis not present

## 2018-03-07 DIAGNOSIS — K219 Gastro-esophageal reflux disease without esophagitis: Secondary | ICD-10-CM | POA: Diagnosis not present

## 2018-03-07 DIAGNOSIS — Z79899 Other long term (current) drug therapy: Secondary | ICD-10-CM | POA: Diagnosis not present

## 2018-03-14 DIAGNOSIS — J449 Chronic obstructive pulmonary disease, unspecified: Secondary | ICD-10-CM | POA: Diagnosis not present

## 2018-03-18 ENCOUNTER — Telehealth: Payer: Self-pay | Admitting: Family

## 2018-03-18 NOTE — Telephone Encounter (Signed)
Patient's spouse called to say that he feels like patient's legs are swollen. She is currently at [facility] and he's not sure what her weight has been. He also mentions that patient has also been experiencing some chest pain. He is going to the facility today and I instructed him to let the patient's nurse know about her chest pain and the swelling in her legs so that the facility provider can take a look at the patient.   Patient's husband verbalized understanding.

## 2018-03-20 ENCOUNTER — Ambulatory Visit: Payer: Medicare HMO | Admitting: Nurse Practitioner

## 2018-03-22 DIAGNOSIS — D649 Anemia, unspecified: Secondary | ICD-10-CM | POA: Diagnosis not present

## 2018-03-22 DIAGNOSIS — Z5181 Encounter for therapeutic drug level monitoring: Secondary | ICD-10-CM | POA: Diagnosis not present

## 2018-03-24 DIAGNOSIS — J9621 Acute and chronic respiratory failure with hypoxia: Secondary | ICD-10-CM | POA: Diagnosis not present

## 2018-03-25 ENCOUNTER — Ambulatory Visit: Payer: Medicare HMO | Admitting: Internal Medicine

## 2018-03-25 DIAGNOSIS — J9621 Acute and chronic respiratory failure with hypoxia: Secondary | ICD-10-CM | POA: Diagnosis not present

## 2018-03-25 DIAGNOSIS — D649 Anemia, unspecified: Secondary | ICD-10-CM | POA: Diagnosis not present

## 2018-03-26 ENCOUNTER — Encounter: Payer: Self-pay | Admitting: Internal Medicine

## 2018-03-26 ENCOUNTER — Ambulatory Visit (INDEPENDENT_AMBULATORY_CARE_PROVIDER_SITE_OTHER): Payer: Medicare HMO | Admitting: Internal Medicine

## 2018-03-26 VITALS — BP 116/64 | HR 103 | Ht 62.0 in | Wt 271.0 lb

## 2018-03-26 DIAGNOSIS — G4733 Obstructive sleep apnea (adult) (pediatric): Secondary | ICD-10-CM

## 2018-03-26 DIAGNOSIS — J449 Chronic obstructive pulmonary disease, unspecified: Secondary | ICD-10-CM

## 2018-03-26 DIAGNOSIS — J9611 Chronic respiratory failure with hypoxia: Secondary | ICD-10-CM | POA: Diagnosis not present

## 2018-03-26 NOTE — Progress Notes (Signed)
PULMONARY OFFICE FOLLOW UP NOTE  Requesting MD/Service: [name], NP Date of initial consultation: 01/20/16 Reason for consultation:  Chronic hypoxemic respiratory failure, COPD, OHS  PT PROFILE: 78 y.o. F smoker referred for evaluation of chronic hypoxemic respiratory failure COPD, OHS.   DATA: 02/12/15 CT chest:No evidence of pulmonary embolism. No evidence of acute cardiopulmonary disease 06/21/15 spirometry: FVC: 1.82 L (85 %pred), FEV1:  1.38 L (83 %pred), FEV1/FVC: 75%   INTERVAL: Hospitalized 5/16-5/18/19 for aphasia   CC follow up SOB  HPI  Morbidly obese AAF +DOE chronic On 24/7 2L [place] oxygen threpay Uses and benefits from therapy  No signs of infection No signs of COPD exacerbation No signs of CHF exacerbation  +OSA on CPAP  No acute resp distress at this time  Uses NEBs as needed  +1 PPD smoker 40 pack year Quit 1 year ago      Review of Systems:  Gen:  Denies  fever, sweats, chills weigh loss  HEENT: Denies blurred vision, double vision, ear pain, eye pain, hearing loss, nose bleeds, sore throat Cardiac:  No dizziness, chest pain or heaviness, chest tightness,edema, No JVD Resp:   No cough, -sputum production, +shortness of breath,-wheezing, -hemoptysis,  Gi: Denies swallowing difficulty, stomach pain, nausea or vomiting, diarrhea, constipation, bowel incontinence Gu:  Denies bladder incontinence, burning urine Ext:   Denies Joint pain, +swelling Skin: Denies  skin rash, easy bruising or bleeding or hives Endoc:  Denies polyuria, polydipsia , polyphagia or weight change Psych:   Denies depression, insomnia or hallucinations  Other:  All other systems negative   Physical Examination:   GENERAL:NAD, no fevers, chills, no weakness no fatigue HEAD: Normocephalic, atraumatic.  EYES: PERLA, EOMI No scleral icterus.  MOUTH: Moist mucosal membrane.  EAR, NOSE, THROAT: Clear without exudates. No external lesions.  NECK: Supple. No thyromegaly.   No JVD.  PULMONARY: CTA B/L no wheezing, rhonchi, crackles CARDIOVASCULAR: S1 and S2. Regular rate and rhythm. No murmurs GASTROINTESTINAL: Soft, nontender, nondistended. Positive bowel sounds.  MUSCULOSKELETAL: + edema.  NEUROLOGIC: No gross focal neurological deficits. 5/5 strength all extremities SKIN: No ulceration, lesions, rashes, or cyanosis.  PSYCHIATRIC: Insight, judgment intact. -depression -anxiety ALL OTHER ROS ARE NEGATIVE  BP 116/64 (BP Location: Left Arm, Cuff Size: Normal)   Pulse (!) 103   Ht 5\' 2"  (1.575 m)   Wt 271 lb (122.9 kg)   SpO2 96%   BMI 49.57 kg/m         CXR 07/24/17:CM, low volumes, no acute findings  IMPRESSION:    78 year old morbidly obese African-American female with multiple comorbidities including COPD in the setting of chronic hypoxic respiratory failure with congestive heart failure in setting of morbid obesity and deconditioned state  #1 chronic shortness of breath and dyspnea exertion Multiple etiologies include COPD deconditioned state obesity  #2 COPD former smoker Gold stage D Patient at high risk for recurrent COPD exacerbations due to her other co morbidities Continue nebulizers as needed-she does not want any other inhalers at this time  #3 chronic hypoxic respiratory failure from COPD Continue oxygen as prescribed She needs this and uses this for survival  #4 CHF Lasix as needed and tolerated Follow-up with CHF clinic  #5 obesity -recommend significant weight loss -recommend changing diet  #6 deconditioned state -Recommend increased daily activity and exercise  #7 OSA Continue CPAP as prescribed We will need compliance report and next visit  Follow-up in 6 months   Kailana Benninger Patricia Pesa, M.D.  [facility]  Medical Director [place] Director [facility] Cardio-Pulmonary Department

## 2018-03-26 NOTE — Patient Instructions (Addendum)
1.Conitnue Oxygen as prescribed  2.NEB therapy as prescribed   3.Continue CPAP at NAP times and at night  4.follow up with Cardiology CHF clinic

## 2018-04-03 DIAGNOSIS — D649 Anemia, unspecified: Secondary | ICD-10-CM | POA: Diagnosis not present

## 2018-04-03 DIAGNOSIS — R079 Chest pain, unspecified: Secondary | ICD-10-CM | POA: Diagnosis not present

## 2018-04-03 DIAGNOSIS — I2510 Atherosclerotic heart disease of native coronary artery without angina pectoris: Secondary | ICD-10-CM | POA: Diagnosis not present

## 2018-04-03 DIAGNOSIS — R031 Nonspecific low blood-pressure reading: Secondary | ICD-10-CM | POA: Diagnosis not present

## 2018-04-03 DIAGNOSIS — R9431 Abnormal electrocardiogram [ECG] [EKG]: Secondary | ICD-10-CM | POA: Diagnosis not present

## 2018-04-03 DIAGNOSIS — Z5181 Encounter for therapeutic drug level monitoring: Secondary | ICD-10-CM | POA: Diagnosis not present

## 2018-04-03 DIAGNOSIS — I10 Essential (primary) hypertension: Secondary | ICD-10-CM | POA: Diagnosis not present

## 2018-04-03 DIAGNOSIS — J9621 Acute and chronic respiratory failure with hypoxia: Secondary | ICD-10-CM | POA: Diagnosis not present

## 2018-04-05 ENCOUNTER — Telehealth: Payer: Self-pay

## 2018-04-05 NOTE — Telephone Encounter (Signed)
Call pt regarding lung screening. [phone] has been disconnected. [phone] has a different person voice mail. I left message stating that if it was the correct person to please return the call.

## 2018-04-06 ENCOUNTER — Encounter: Payer: Self-pay | Admitting: *Deleted

## 2018-04-06 ENCOUNTER — Telehealth: Payer: Self-pay | Admitting: *Deleted

## 2018-04-06 DIAGNOSIS — J449 Chronic obstructive pulmonary disease, unspecified: Secondary | ICD-10-CM | POA: Diagnosis not present

## 2018-04-06 NOTE — Telephone Encounter (Signed)
Attempted to contact patient r/t LDCT Screening follow up due at this time.  No answer received, message left for patient to call [phone] to schedule appointment.   Letter mailed to patient

## 2018-04-11 DIAGNOSIS — D649 Anemia, unspecified: Secondary | ICD-10-CM | POA: Diagnosis not present

## 2018-04-11 DIAGNOSIS — A411 Sepsis due to other specified staphylococcus: Secondary | ICD-10-CM | POA: Diagnosis not present

## 2018-04-13 ENCOUNTER — Inpatient Hospital Stay
Admission: EM | Admit: 2018-04-13 | Discharge: 2018-04-15 | DRG: 194 | Disposition: A | Discharge status: Skilled Nursing Facility | Payer: Medicare HMO | Source: Skilled Nursing Facility | Attending: Family Medicine | Admitting: Family Medicine

## 2018-04-13 ENCOUNTER — Emergency Department: Payer: Medicare HMO

## 2018-04-13 ENCOUNTER — Other Ambulatory Visit: Payer: Self-pay

## 2018-04-13 DIAGNOSIS — J449 Chronic obstructive pulmonary disease, unspecified: Secondary | ICD-10-CM | POA: Diagnosis not present

## 2018-04-13 DIAGNOSIS — Z7401 Bed confinement status: Secondary | ICD-10-CM | POA: Diagnosis not present

## 2018-04-13 DIAGNOSIS — J9611 Chronic respiratory failure with hypoxia: Secondary | ICD-10-CM | POA: Diagnosis present

## 2018-04-13 DIAGNOSIS — I130 Hypertensive heart and chronic kidney disease with heart failure and stage 1 through stage 4 chronic kidney disease, or unspecified chronic kidney disease: Secondary | ICD-10-CM | POA: Diagnosis not present

## 2018-04-13 DIAGNOSIS — K219 Gastro-esophageal reflux disease without esophagitis: Secondary | ICD-10-CM | POA: Diagnosis present

## 2018-04-13 DIAGNOSIS — E1122 Type 2 diabetes mellitus with diabetic chronic kidney disease: Secondary | ICD-10-CM | POA: Diagnosis present

## 2018-04-13 DIAGNOSIS — J101 Influenza due to other identified influenza virus with other respiratory manifestations: Secondary | ICD-10-CM

## 2018-04-13 DIAGNOSIS — L280 Lichen simplex chronicus: Secondary | ICD-10-CM | POA: Diagnosis present

## 2018-04-13 DIAGNOSIS — R05 Cough: Secondary | ICD-10-CM | POA: Diagnosis not present

## 2018-04-13 DIAGNOSIS — B9562 Methicillin resistant Staphylococcus aureus infection as the cause of diseases classified elsewhere: Secondary | ICD-10-CM | POA: Diagnosis present

## 2018-04-13 DIAGNOSIS — N184 Chronic kidney disease, stage 4 (severe): Secondary | ICD-10-CM | POA: Diagnosis present

## 2018-04-13 DIAGNOSIS — Z79899 Other long term (current) drug therapy: Secondary | ICD-10-CM

## 2018-04-13 DIAGNOSIS — K5909 Other constipation: Secondary | ICD-10-CM | POA: Diagnosis present

## 2018-04-13 DIAGNOSIS — R0602 Shortness of breath: Secondary | ICD-10-CM | POA: Diagnosis not present

## 2018-04-13 DIAGNOSIS — I2510 Atherosclerotic heart disease of native coronary artery without angina pectoris: Secondary | ICD-10-CM | POA: Diagnosis present

## 2018-04-13 DIAGNOSIS — N390 Urinary tract infection, site not specified: Secondary | ICD-10-CM | POA: Diagnosis not present

## 2018-04-13 DIAGNOSIS — R0902 Hypoxemia: Secondary | ICD-10-CM | POA: Diagnosis not present

## 2018-04-13 DIAGNOSIS — Z9981 Dependence on supplemental oxygen: Secondary | ICD-10-CM | POA: Diagnosis not present

## 2018-04-13 DIAGNOSIS — I959 Hypotension, unspecified: Secondary | ICD-10-CM | POA: Diagnosis not present

## 2018-04-13 DIAGNOSIS — G40909 Epilepsy, unspecified, not intractable, without status epilepticus: Secondary | ICD-10-CM | POA: Diagnosis present

## 2018-04-13 DIAGNOSIS — J441 Chronic obstructive pulmonary disease with (acute) exacerbation: Secondary | ICD-10-CM | POA: Diagnosis present

## 2018-04-13 DIAGNOSIS — E785 Hyperlipidemia, unspecified: Secondary | ICD-10-CM | POA: Diagnosis present

## 2018-04-13 DIAGNOSIS — Z791 Long term (current) use of non-steroidal anti-inflammatories (NSAID): Secondary | ICD-10-CM

## 2018-04-13 DIAGNOSIS — D519 Vitamin B12 deficiency anemia, unspecified: Secondary | ICD-10-CM | POA: Diagnosis present

## 2018-04-13 DIAGNOSIS — E662 Morbid (severe) obesity with alveolar hypoventilation: Secondary | ICD-10-CM | POA: Diagnosis not present

## 2018-04-13 DIAGNOSIS — Z6841 Body Mass Index (BMI) 40.0 and over, adult: Secondary | ICD-10-CM

## 2018-04-13 DIAGNOSIS — F17211 Nicotine dependence, cigarettes, in remission: Secondary | ICD-10-CM | POA: Diagnosis present

## 2018-04-13 DIAGNOSIS — N3000 Acute cystitis without hematuria: Secondary | ICD-10-CM | POA: Diagnosis not present

## 2018-04-13 DIAGNOSIS — Z832 Family history of diseases of the blood and blood-forming organs and certain disorders involving the immune mechanism: Secondary | ICD-10-CM

## 2018-04-13 DIAGNOSIS — J111 Influenza due to unidentified influenza virus with other respiratory manifestations: Secondary | ICD-10-CM | POA: Diagnosis not present

## 2018-04-13 DIAGNOSIS — I5032 Chronic diastolic (congestive) heart failure: Secondary | ICD-10-CM | POA: Diagnosis not present

## 2018-04-13 DIAGNOSIS — J9610 Chronic respiratory failure, unspecified whether with hypoxia or hypercapnia: Secondary | ICD-10-CM | POA: Diagnosis not present

## 2018-04-13 DIAGNOSIS — F329 Major depressive disorder, single episode, unspecified: Secondary | ICD-10-CM | POA: Diagnosis present

## 2018-04-13 DIAGNOSIS — E559 Vitamin D deficiency, unspecified: Secondary | ICD-10-CM | POA: Diagnosis present

## 2018-04-13 DIAGNOSIS — Z7902 Long term (current) use of antithrombotics/antiplatelets: Secondary | ICD-10-CM

## 2018-04-13 DIAGNOSIS — Z888 Allergy status to other drugs, medicaments and biological substances status: Secondary | ICD-10-CM

## 2018-04-13 DIAGNOSIS — Z8673 Personal history of transient ischemic attack (TIA), and cerebral infarction without residual deficits: Secondary | ICD-10-CM

## 2018-04-13 DIAGNOSIS — R079 Chest pain, unspecified: Secondary | ICD-10-CM | POA: Diagnosis not present

## 2018-04-13 DIAGNOSIS — Z8249 Family history of ischemic heart disease and other diseases of the circulatory system: Secondary | ICD-10-CM

## 2018-04-13 DIAGNOSIS — R069 Unspecified abnormalities of breathing: Secondary | ICD-10-CM | POA: Diagnosis not present

## 2018-04-13 DIAGNOSIS — F419 Anxiety disorder, unspecified: Secondary | ICD-10-CM | POA: Diagnosis present

## 2018-04-13 DIAGNOSIS — D509 Iron deficiency anemia, unspecified: Secondary | ICD-10-CM | POA: Diagnosis present

## 2018-04-13 DIAGNOSIS — R51 Headache: Secondary | ICD-10-CM | POA: Diagnosis not present

## 2018-04-13 DIAGNOSIS — M2550 Pain in unspecified joint: Secondary | ICD-10-CM | POA: Diagnosis not present

## 2018-04-13 HISTORY — DX: Influenza due to other identified influenza virus with other respiratory manifestations: J10.1

## 2018-04-13 LAB — URINALYSIS, COMPLETE (UACMP) WITH MICROSCOPIC
BILIRUBIN URINE: NEGATIVE
Glucose, UA: NEGATIVE mg/dL
Hgb urine dipstick: NEGATIVE
Ketones, ur: NEGATIVE mg/dL
Nitrite: NEGATIVE
Protein, ur: NEGATIVE mg/dL
Specific Gravity, Urine: 1.011 (ref 1.005–1.030)
WBC, UA: >50 WBC/hpf — ABNORMAL HIGH (ref 0–5)
pH: 5 (ref 5.0–8.0)

## 2018-04-13 LAB — CBC WITH DIFFERENTIAL/PLATELET
Abs Immature Granulocytes: 0.01 10*3/uL (ref 0.00–0.07)
Basophils Absolute: 0 10*3/uL (ref 0.0–0.1)
Basophils Relative: 0 %
Eosinophils Absolute: 0 10*3/uL (ref 0.0–0.5)
Eosinophils Relative: 1 %
HCT: 29.2 % — ABNORMAL LOW (ref 36.0–46.0)
Hemoglobin: 9.1 g/dL — ABNORMAL LOW (ref 12.0–15.0)
Immature Granulocytes: 0 %
Lymphocytes Relative: 33 %
Lymphs Abs: 1.3 10*3/uL (ref 0.7–4.0)
MCH: 26.9 pg (ref 26.0–34.0)
MCHC: 31.2 g/dL (ref 30.0–36.0)
MCV: 86.4 fL (ref 80.0–100.0)
Monocytes Absolute: 0.6 10*3/uL (ref 0.1–1.0)
Monocytes Relative: 15 %
Neutro Abs: 2 10*3/uL (ref 1.7–7.7)
Neutrophils Relative %: 51 %
Platelets: 213 10*3/uL (ref 150–400)
RBC: 3.38 MIL/uL — ABNORMAL LOW (ref 3.87–5.11)
RDW: 17.4 % — ABNORMAL HIGH (ref 11.5–15.5)
WBC: 4 10*3/uL (ref 4.0–10.5)
nRBC: 0 % (ref 0.0–0.2)

## 2018-04-13 LAB — BRAIN NATRIURETIC PEPTIDE: B Natriuretic Peptide: 99 pg/mL (ref 0.0–100.0)

## 2018-04-13 LAB — FERRITIN: Ferritin: 70 ng/mL (ref 11–307)

## 2018-04-13 LAB — COMPREHENSIVE METABOLIC PANEL
ALT: 19 U/L (ref 0–44)
AST: 29 U/L (ref 15–41)
Albumin: 3 g/dL — ABNORMAL LOW (ref 3.5–5.0)
Alkaline Phosphatase: 71 U/L (ref 38–126)
Anion gap: 9 (ref 5–15)
BUN: 31 mg/dL — ABNORMAL HIGH (ref 8–23)
CO2: 28 mmol/L (ref 22–32)
CREATININE: 1.96 mg/dL — AB (ref 0.44–1.00)
Calcium: 8.8 mg/dL — ABNORMAL LOW (ref 8.9–10.3)
Chloride: 100 mmol/L (ref 98–111)
GFR calc Af Amer: 28 mL/min — ABNORMAL LOW (ref >60)
GFR calc non Af Amer: 24 mL/min — ABNORMAL LOW (ref >60)
Glucose, Bld: 109 mg/dL — ABNORMAL HIGH (ref 70–99)
Potassium: 4.2 mmol/L (ref 3.5–5.1)
Sodium: 137 mmol/L (ref 135–145)
Total Bilirubin: 0.9 mg/dL (ref 0.3–1.2)
Total Protein: 6.8 g/dL (ref 6.5–8.1)

## 2018-04-13 LAB — INFLUENZA PANEL BY PCR (TYPE A & B)
Influenza A By PCR: POSITIVE — AB
Influenza B By PCR: NEGATIVE

## 2018-04-13 LAB — TROPONIN I

## 2018-04-13 MED ORDER — OSELTAMIVIR PHOSPHATE 30 MG PO CAPS
30.0000 mg | ORAL_CAPSULE | Freq: Two times a day (BID) | ORAL | Status: DC
  Administered 2018-04-14 – 2018-04-15 (×4): 30 mg via ORAL
  Filled 2018-04-13 (×5): qty 1

## 2018-04-13 MED ORDER — DILTIAZEM HCL ER 60 MG PO CP12
120.0000 mg | ORAL_CAPSULE | Freq: Two times a day (BID) | ORAL | Status: DC
  Administered 2018-04-14 – 2018-04-15 (×2): 120 mg via ORAL
  Filled 2018-04-13 (×5): qty 2

## 2018-04-13 MED ORDER — ARFORMOTEROL TARTRATE 15 MCG/2ML IN NEBU
15.0000 ug | INHALATION_SOLUTION | Freq: Two times a day (BID) | RESPIRATORY_TRACT | Status: DC
  Administered 2018-04-14 – 2018-04-15 (×2): 15 ug via RESPIRATORY_TRACT
  Filled 2018-04-13 (×6): qty 2

## 2018-04-13 MED ORDER — IPRATROPIUM-ALBUTEROL 0.5-2.5 (3) MG/3ML IN SOLN
3.0000 mL | Freq: Once | RESPIRATORY_TRACT | Status: AC
  Administered 2018-04-13: 3 mL via RESPIRATORY_TRACT
  Filled 2018-04-13: qty 3

## 2018-04-13 MED ORDER — ATORVASTATIN CALCIUM 20 MG PO TABS
80.0000 mg | ORAL_TABLET | Freq: Every evening | ORAL | Status: DC
  Administered 2018-04-13 – 2018-04-14 (×2): 80 mg via ORAL
  Filled 2018-04-13 (×2): qty 4

## 2018-04-13 MED ORDER — SODIUM CHLORIDE 0.9 % IV SOLN
1.0000 g | INTRAVENOUS | Status: DC
  Administered 2018-04-14: 1 g via INTRAVENOUS
  Filled 2018-04-13 (×3): qty 10

## 2018-04-13 MED ORDER — OSELTAMIVIR PHOSPHATE 30 MG PO CAPS
30.0000 mg | ORAL_CAPSULE | Freq: Once | ORAL | Status: AC
  Administered 2018-04-13: 30 mg via ORAL
  Filled 2018-04-13 (×2): qty 1

## 2018-04-13 MED ORDER — FLUOXETINE HCL 20 MG PO CAPS
40.0000 mg | ORAL_CAPSULE | Freq: Every day | ORAL | Status: DC
  Administered 2018-04-13 – 2018-04-15 (×3): 40 mg via ORAL
  Filled 2018-04-13 (×3): qty 2

## 2018-04-13 MED ORDER — IPRATROPIUM-ALBUTEROL 0.5-2.5 (3) MG/3ML IN SOLN
3.0000 mL | Freq: Four times a day (QID) | RESPIRATORY_TRACT | Status: DC
  Administered 2018-04-14 – 2018-04-15 (×5): 3 mL via RESPIRATORY_TRACT
  Filled 2018-04-13 (×6): qty 3

## 2018-04-13 MED ORDER — SODIUM CHLORIDE 0.9 % IV SOLN
1.0000 g | Freq: Once | INTRAVENOUS | Status: AC
  Administered 2018-04-13: 1 g via INTRAVENOUS
  Filled 2018-04-13: qty 10

## 2018-04-13 MED ORDER — ACETAMINOPHEN 325 MG PO TABS
650.0000 mg | ORAL_TABLET | Freq: Three times a day (TID) | ORAL | Status: DC
  Administered 2018-04-14 – 2018-04-15 (×5): 650 mg via ORAL
  Filled 2018-04-13 (×6): qty 2

## 2018-04-13 MED ORDER — DOCUSATE SODIUM 100 MG PO CAPS
100.0000 mg | ORAL_CAPSULE | Freq: Every day | ORAL | Status: DC
  Administered 2018-04-13 – 2018-04-15 (×3): 100 mg via ORAL
  Filled 2018-04-13 (×3): qty 1

## 2018-04-13 MED ORDER — LORATADINE 10 MG PO TABS
10.0000 mg | ORAL_TABLET | Freq: Every day | ORAL | Status: DC
  Administered 2018-04-15: 13:00:00 10 mg via ORAL
  Filled 2018-04-13: qty 1

## 2018-04-13 MED ORDER — LEVETIRACETAM ER 500 MG PO TB24
500.0000 mg | ORAL_TABLET | Freq: Every day | ORAL | Status: DC
  Administered 2018-04-14 – 2018-04-15 (×2): 500 mg via ORAL
  Filled 2018-04-13 (×2): qty 1

## 2018-04-13 MED ORDER — IPRATROPIUM BROMIDE 0.03 % NA SOLN
2.0000 | Freq: Two times a day (BID) | NASAL | Status: DC
  Filled 2018-04-13: qty 30

## 2018-04-13 MED ORDER — SODIUM CHLORIDE 0.9 % IV SOLN: 1.0000 g | INTRAVENOUS | Status: DC

## 2018-04-13 MED ORDER — LINAGLIPTIN 5 MG PO TABS
5.0000 mg | ORAL_TABLET | Freq: Every day | ORAL | Status: DC
  Administered 2018-04-14 – 2018-04-15 (×2): 5 mg via ORAL
  Filled 2018-04-13 (×2): qty 1

## 2018-04-13 MED ORDER — HYDRALAZINE HCL 50 MG PO TABS
25.0000 mg | ORAL_TABLET | Freq: Three times a day (TID) | ORAL | Status: DC
  Administered 2018-04-14 – 2018-04-15 (×4): 25 mg via ORAL
  Filled 2018-04-13 (×4): qty 1

## 2018-04-13 MED ORDER — MECLIZINE HCL 25 MG PO TABS
25.0000 mg | ORAL_TABLET | Freq: Two times a day (BID) | ORAL | Status: DC | PRN
  Filled 2018-04-13: qty 1

## 2018-04-13 MED ORDER — PANTOPRAZOLE SODIUM 40 MG PO TBEC
40.0000 mg | DELAYED_RELEASE_TABLET | Freq: Every day | ORAL | Status: DC
  Administered 2018-04-14 – 2018-04-15 (×2): 40 mg via ORAL
  Filled 2018-04-13 (×2): qty 1

## 2018-04-13 MED ORDER — GABAPENTIN 300 MG PO CAPS
300.0000 mg | ORAL_CAPSULE | Freq: Every day | ORAL | Status: DC
  Administered 2018-04-14: 300 mg via ORAL
  Filled 2018-04-13 (×2): qty 1

## 2018-04-13 MED ORDER — BUDESONIDE 0.5 MG/2ML IN SUSP
0.5000 mg | Freq: Two times a day (BID) | RESPIRATORY_TRACT | Status: DC
  Administered 2018-04-14 – 2018-04-15 (×3): 0.5 mg via RESPIRATORY_TRACT
  Filled 2018-04-13 (×5): qty 2

## 2018-04-13 MED ORDER — CLOPIDOGREL BISULFATE 75 MG PO TABS
75.0000 mg | ORAL_TABLET | Freq: Every day | ORAL | Status: DC
  Administered 2018-04-14 – 2018-04-15 (×2): 75 mg via ORAL
  Filled 2018-04-13 (×2): qty 1

## 2018-04-13 MED ORDER — VITAMIN D 25 MCG (1000 UNIT) PO TABS
1000.0000 [IU] | ORAL_TABLET | Freq: Every day | ORAL | Status: DC
  Administered 2018-04-14 – 2018-04-15 (×2): 1000 [IU] via ORAL
  Filled 2018-04-13 (×2): qty 1

## 2018-04-13 MED ORDER — ADULT MULTIVITAMIN W/MINERALS CH
1.0000 | ORAL_TABLET | Freq: Every day | ORAL | Status: DC
  Administered 2018-04-14 – 2018-04-15 (×2): 1 via ORAL
  Filled 2018-04-13 (×2): qty 1

## 2018-04-13 MED ORDER — SODIUM CHLORIDE 0.9 % IV BOLUS
500.0000 mL | Freq: Once | INTRAVENOUS | Status: AC
  Administered 2018-04-13: 500 mL via INTRAVENOUS

## 2018-04-13 NOTE — ED Notes (Signed)
ED TO INPATIENT HANDOFF REPORT  Name/Age/Gender Katherine [name] 78 y.o. female  Code Status Code Status History    Date Active Date Inactive Code Status Order ID Comments User Context   02/24/2018 2221 02/28/2018 1815 Full Code [id number]  Dustin Flock, MD Inpatient   12/25/2017 1653 12/26/2017 2152 Full Code [id number]  Gorden Harms, MD Inpatient   11/29/2017 2119 12/05/2017 1841 Full Code [id number]  Salary, Avel Peace, MD Inpatient   11/22/2017 0408 11/26/2017 1804 Full Code [id number]  Arta Silence, MD Inpatient   11/10/2017 1840 11/13/2017 1653 Full Code [id number]  Mayo, Pete Pelt, MD Inpatient   10/24/2017 0247 10/29/2017 2301 Full Code [id number]  Lance Coon, MD Inpatient   10/04/2017 1005 10/10/2017 1813 Full Code [id number]  Marliss Coots, PA-C Inpatient   10/01/2017 0819 10/04/2017 0904 Full Code [id number]  Lolita Cram, RN Inpatient   09/19/2017 1306 09/22/2017 1622 Full Code [id number]  Gorden Harms, MD Inpatient   09/11/2017 2021 09/15/2017 1510 Full Code [id number]  Gorden Harms, MD Inpatient   08/27/2017 1543 08/29/2017 2159 Full Code [id number]  Hillary Bow, MD ED   06/28/2017 1414 06/30/2017 1748 Full Code [id number]  Gorden Harms, MD Inpatient   06/12/2017 0858 06/16/2017 1659 DNR [id number]  Demetrios Loll, MD Inpatient   05/26/2017 1732 05/30/2017 1446 DNR [id number]  Hillary Bow, MD ED   05/17/2017 1228 05/21/2017 1511 DNR [id number]  Asencion Gowda, NP Inpatient   04/21/2017 0117 04/22/2017 1750 DNR [id number]  Lance Coon, MD Inpatient   04/05/2017 1411 04/08/2017 2101 Full Code [id number]  Saundra Shelling, MD Inpatient   02/08/2016 1414 02/11/2016 1729 Full Code [id number]  Hessie Knows, MD Inpatient   11/27/2015 1645 11/29/2015 1628 Full Code [id number]  Idelle Crouch, MD Inpatient   02/10/2015 2122 02/12/2015 1809 Full Code [id number]  Lytle Butte, MD ED   09/16/2014 1220 09/19/2014 1736 Full Code [id number]  Aldean Jewett, MD Inpatient       Home/SNF/Other Home  Chief Complaint Breathing difficulty ems  Level of Care/Admitting Diagnosis ED Disposition    ED Disposition Condition [place]: [facility] [[id number]]  Level of Care: Med-Surg [16]  Diagnosis: Influenza A [989211]  Admitting Physician: Loletha Grayer [[id number]]  Attending Physician: Loletha Grayer [[id number]]  Estimated length of stay: past midnight tomorrow  Certification:: I certify this patient will need inpatient services for at least 2 midnights  PT Class (Do Not Modify): Inpatient [101]  PT Acc Code (Do Not Modify): Private [1]       Medical History Past Medical History:  Diagnosis Date  . Angioedema   . Anxiety   . Anxiety and depression   . CHF (congestive heart failure) ([place])   . CHF (congestive heart failure) ([place])   . Chronic constipation   . Chronic kidney disease    stage 3  . COPD (chronic obstructive pulmonary disease) ([place])   . Diabetes mellitus without complication ([place])   . Gallstones   . GERD (gastroesophageal reflux disease)   . Hypertension   . Left ventricular hypertrophy   . Osteoarthritis   . Prurigo nodularis   . Seizures ([place])   . Stroke ([place])   . Tobacco abuse   . Vitamin D deficiency disease     Allergies Allergies  Allergen Reactions  . Bee Venom Swelling  . Enalapril Maleate Swelling    IV Location/Drains/Wounds Patient Lines/Drains/Airways Status   Active Line/Drains/Airways    Name:   Placement  date:   Placement time:   Site:   Days:   Peripheral IV 04/13/18 Left Hand   04/13/18    -    Hand   less than 1   External Urinary Catheter   04/13/18    1659    -   less than 1          Labs/Imaging Results for orders placed or performed during the hospital encounter of 04/13/18 (from the past 48 hour(s))  Comprehensive metabolic panel     Status: Abnormal   Collection Time: 04/13/18  1:17 PM  Result Value Ref Range   Sodium 137 135 - 145 mmol/L    Potassium 4.2 3.5 - 5.1 mmol/L   Chloride 100 98 - 111 mmol/L   CO2 28 22 - 32 mmol/L   Glucose, Bld 109 (H) 70 - 99 mg/dL   BUN 31 (H) 8 - 23 mg/dL   Creatinine, Ser 1.96 (H) 0.44 - 1.00 mg/dL   Calcium 8.8 (L) 8.9 - 10.3 mg/dL   Total Protein 6.8 6.5 - 8.1 g/dL   Albumin 3.0 (L) 3.5 - 5.0 g/dL   AST 29 15 - 41 U/L   ALT 19 0 - 44 U/L   Alkaline Phosphatase 71 38 - 126 U/L   Total Bilirubin 0.9 0.3 - 1.2 mg/dL   GFR calc non Af Amer 24 (L) >60 mL/min   GFR calc Af Amer 28 (L) >60 mL/min   Anion gap 9 5 - 15    Comment: Performed at [facility], [address] [place], [place] [address]  CBC with Differential     Status: Abnormal   Collection Time: 04/13/18  1:17 PM  Result Value Ref Range   WBC 4.0 4.0 - 10.5 K/uL   RBC 3.38 (L) 3.87 - 5.11 MIL/uL   Hemoglobin 9.1 (L) 12.0 - 15.0 g/dL   HCT 29.2 (L) 36.0 - 46.0 %   MCV 86.4 80.0 - 100.0 fL   MCH 26.9 26.0 - 34.0 pg   MCHC 31.2 30.0 - 36.0 g/dL   RDW 17.4 (H) 11.5 - 15.5 %   Platelets 213 150 - 400 K/uL   nRBC 0.0 0.0 - 0.2 %   Neutrophils Relative % 51 %   Neutro Abs 2.0 1.7 - 7.7 K/uL   Lymphocytes Relative 33 %   Lymphs Abs 1.3 0.7 - 4.0 K/uL   Monocytes Relative 15 %   Monocytes Absolute 0.6 0.1 - 1.0 K/uL   Eosinophils Relative 1 %   Eosinophils Absolute 0.0 0.0 - 0.5 K/uL   Basophils Relative 0 %   Basophils Absolute 0.0 0.0 - 0.1 K/uL   Immature Granulocytes 0 %   Abs Immature Granulocytes 0.01 0.00 - 0.07 K/uL    Comment: Performed at [facility], [address] [place], [place] [address]  Brain natriuretic peptide     Status: None   Collection Time: 04/13/18  1:17 PM  Result Value Ref Range   B Natriuretic Peptide 99.0 0.0 - 100.0 pg/mL    Comment: Performed at [facility], [address], [address] [place], [place] [address]  Troponin I - Once     Status: None   Collection Time: 04/13/18  1:17 PM  Result Value Ref Range   Troponin I <0.03 <0.03 ng/mL    Comment: Performed at  [facility], [address] [place], [place] [address]  Urinalysis, Complete w Microscopic     Status: Abnormal   Collection Time:  04/13/18  1:19 PM  Result Value Ref Range   Color, Urine YELLOW (A) YELLOW   APPearance HAZY (A) CLEAR   Specific Gravity, Urine 1.011 1.005 - 1.030   pH 5.0 5.0 - 8.0   Glucose, UA NEGATIVE NEGATIVE mg/dL   Hgb urine dipstick NEGATIVE NEGATIVE   Bilirubin Urine NEGATIVE NEGATIVE   Ketones, ur NEGATIVE NEGATIVE mg/dL   Protein, ur NEGATIVE NEGATIVE mg/dL   Nitrite NEGATIVE NEGATIVE   Leukocytes,Ua MODERATE (A) NEGATIVE   RBC / HPF 0-5 0 - 5 RBC/hpf   WBC, UA >50 (H) 0 - 5 WBC/hpf   Bacteria, UA MANY (A) NONE SEEN   Squamous Epithelial / LPF 0-5 0 - 5   WBC Clumps PRESENT    Mucus PRESENT    Hyaline Casts, UA PRESENT     Comment: Performed at [facility], [address], [place], [place] [address]  Influenza panel by PCR (type A & B)     Status: Abnormal   Collection Time: 04/13/18  1:20 PM  Result Value Ref Range   Influenza A By PCR POSITIVE (A) NEGATIVE   Influenza B By PCR NEGATIVE NEGATIVE    Comment: (NOTE) The Xpert Xpress Flu assay is intended as an aid in the diagnosis of  influenza and should not be used as a sole basis for treatment.  This  assay is FDA approved for nasopharyngeal swab specimens only. Nasal  washings and aspirates are unacceptable for Xpert Xpress Flu testing. Performed at [facility], [address] [place], [place] [address]   Ferritin     Status: None   Collection Time: 04/13/18  6:37 PM  Result Value Ref Range   Ferritin 70 11 - 307 ng/mL    Comment: Performed at [facility], [address] [place], [place] [address]   Dg Chest Port 1 View  Result Date: 04/13/2018 CLINICAL DATA:  Cough and shortness of breath. EXAM: PORTABLE CHEST 1 VIEW COMPARISON:  02/24/2018 and prior radiographs FINDINGS: Cardiomegaly again noted. Mild chronic peribronchial thickening is  unchanged. There is no evidence of focal airspace disease, pulmonary edema, suspicious pulmonary nodule/mass, pleural effusion, or pneumothorax. No acute bony abnormalities are identified. IMPRESSION: Cardiomegaly without evidence of acute cardiopulmonary disease. Electronically Signed   By: Margarette Canada M.D.   On: 04/13/2018 14:52    Pending Labs Unresulted Labs (From admission, onward)    Start     Ordered   04/14/18 0500  Vitamin B12  Tomorrow morning,   STAT     04/13/18 1837   04/13/18 1820  Urine Culture  Once,   STAT    Question:  Patient immune status  Answer:  Normal   04/13/18 1819   Unscheduled  Occult blood card to lab, stool RN will collect  As needed,   STAT    Question:  Specimen to be collected by?  Answer:  RN will collect   04/13/18 1837          Vitals/Pain Today's Vitals   04/13/18 1750 04/13/18 1900 04/13/18 1930 04/13/18 1945  BP: 111/68 117/62 (!) 125/59   Pulse: 82 80 79   Resp: (!) 23 (!) 24 (!) 22   Temp:      TempSrc:      SpO2: 92% 92% 94%   Weight:      Height:      PainSc:    0-No pain    Isolation Precautions Droplet precaution  Medications Medications  oseltamivir (TAMIFLU) capsule  30 mg (has no administration in time range)  acetaminophen (TYLENOL) tablet 650 mg (has no administration in time range)  atorvastatin (LIPITOR) tablet 80 mg (80 mg Oral Given 04/13/18 2034)  diltiazem (CARDIZEM SR) 12 hr capsule 120 mg (has no administration in time range)  hydrALAZINE (APRESOLINE) tablet 25 mg (has no administration in time range)  FLUoxetine (PROZAC) capsule 40 mg (40 mg Oral Given 04/13/18 2034)  linagliptin (TRADJENTA) tablet 5 mg (has no administration in time range)  docusate sodium (COLACE) capsule 100 mg (100 mg Oral Given 04/13/18 2033)  meclizine (ANTIVERT) tablet 25 mg (has no administration in time range)  pantoprazole (PROTONIX) EC tablet 40 mg (has no administration in time range)  clopidogrel (PLAVIX) tablet 75 mg (has no  administration in time range)  gabapentin (NEURONTIN) capsule 300 mg (has no administration in time range)  levETIRAcetam (KEPPRA XR) 24 hr tablet 500 mg (has no administration in time range)  cholecalciferol (VITAMIN D3) tablet 1,000 Units (has no administration in time range)  multivitamin with minerals tablet 1 tablet (has no administration in time range)  loratadine (CLARITIN) tablet 10 mg (has no administration in time range)  ipratropium (ATROVENT) 0.03 % nasal spray 2 spray (has no administration in time range)  ipratropium-albuterol (DUONEB) 0.5-2.5 (3) MG/3ML nebulizer solution 3 mL (has no administration in time range)  budesonide (PULMICORT) nebulizer solution 0.5 mg (has no administration in time range)  arformoterol (BROVANA) nebulizer solution 15 mcg (has no administration in time range)  cefTRIAXone (ROCEPHIN) 1 g in sodium chloride 0.9 % 100 mL IVPB (has no administration in time range)  ipratropium-albuterol (DUONEB) 0.5-2.5 (3) MG/3ML nebulizer solution 3 mL (3 mLs Nebulization Given 04/13/18 1350)  oseltamivir (TAMIFLU) capsule 30 mg (30 mg Oral Given 04/13/18 1623)  sodium chloride 0.9 % bolus 500 mL (0 mLs Intravenous Stopped 04/13/18 1820)  cefTRIAXone (ROCEPHIN) 1 g in sodium chloride 0.9 % 100 mL IVPB (0 g Intravenous Stopped 04/13/18 1620)  ipratropium-albuterol (DUONEB) 0.5-2.5 (3) MG/3ML nebulizer solution 3 mL (3 mLs Nebulization Given 04/13/18 1624)    Mobility walks with person assist

## 2018-04-13 NOTE — Progress Notes (Signed)
Patient ID: Katherine [name], female   DOB: 05/23/1940, 78 y.o.   MRN: [id number]  ACP note  Spoke with patient at the bedside.  Spoke with husband on the phone.  CODE STATUS discussed and patient is a full code.  The patient has end-stage COPD with chronic respiratory failure and chronic diastolic congestive heart failure.  Overall prognosis with repeated hospitalizations is not good.  Goals of care conversation was done.  Since this is the weekend I will hold off on palliative care consultation since they only come Monday to Friday.  This could be an option at her facility.  Time spent on ACP discussion 17 minutes Dr. Loletha Grayer

## 2018-04-13 NOTE — ED Triage Notes (Signed)
Patient from [facility] complaints of cough SOB  chest discomfort, and headache. Hx of COPD and CHF. EMS gave Albuterol, and Duo

## 2018-04-13 NOTE — H&P (Signed)
[facility] [place] [facility] [place] NAME: Katherine [name]    MR#:  [id number]  DATE OF BIRTH:  [birth year]  DATE OF ADMISSION:  04/13/2018  PRIMARY CARE PHYSICIAN: Valerie Roys, DO   REQUESTING/REFERRING PHYSICIAN: Dr Cephas Darby  CHIEF COMPLAINT:   Chief Complaint  Patient presents with  . Shortness of Breath    HISTORY OF PRESENT ILLNESS:  Katherine [name]  is a 78 y.o. female sent in for weakness and shortness of breath.  The patient does have COPD on chronic oxygen.  She complains of shortness of breath cough with some yellow sputum.  Occasional wheeze.  She has had some fever.  The patient is not the best historian and does answer some yes or no questions.  The patient lives in [facility].  In the ER, she was found to have influenza A positive and a urinary tract infection and hospitalist services were contacted for further evaluation.  PAST MEDICAL HISTORY:   Past Medical History:  Diagnosis Date  . Angioedema   . Anxiety   . Anxiety and depression   . CHF (congestive heart failure) ([place])   . CHF (congestive heart failure) ([place])   . Chronic constipation   . Chronic kidney disease    stage 3  . COPD (chronic obstructive pulmonary disease) ([place])   . Diabetes mellitus without complication ([place])   . Gallstones   . GERD (gastroesophageal reflux disease)   . Hypertension   . Left ventricular hypertrophy   . Osteoarthritis   . Prurigo nodularis   . Seizures ([place])   . Stroke ([place])   . Tobacco abuse   . Vitamin D deficiency disease     PAST SURGICAL HISTORY:   Past Surgical History:  Procedure Laterality Date  . CHOLECYSTECTOMY    . 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   Procedure: TOTAL KNEE ARTHROPLASTY;  Surgeon: Hessie Knows, MD;  Location: [facility] ORS;  Service: Orthopedics;  Laterality: Right;    SOCIAL HISTORY:   Social History   Tobacco Use  . Smoking status:  Former Smoker    Packs/day: 0.50    Years: 60.00    Pack years: 30.00    Types: Cigarettes  . Smokeless tobacco: Never Used  . Tobacco comment: quite 65mo ago-03/26/18  Substance Use Topics  . Alcohol use: No    Alcohol/week: 0.0 standard drinks    Comment: rare    FAMILY HISTORY:   Family History  Problem Relation Age of Onset  . Alcohol abuse Mother   . Sickle cell anemia Daughter   . Hypertension Son   . Cancer Neg Hx   . COPD Neg Hx   . Diabetes Neg Hx   . Heart disease Neg Hx   . Stroke Neg Hx     DRUG ALLERGIES:   Allergies  Allergen Reactions  . Bee Venom Swelling  . Enalapril Maleate Swelling    REVIEW OF SYSTEMS:  CONSTITUTIONAL: Positive for sweating and fever.  Positive for fatigue and weakness.  EYES: No blurred or double vision.  EARS, NOSE, AND THROAT: No tinnitus or ear pain. No sore throat.  Positive for runny nose RESPIRATORY: Positive for cough, shortness of breath, and wheezing.no hemoptysis.  CARDIOVASCULAR: Positive for occasional chest pain.  GASTROINTESTINAL: No nausea, vomiting, diarrhea or abdominal pain. No blood in bowel movements GENITOURINARY: No urinary symptoms ENDOCRINE: No increased urination HEMATOLOGY: No anemia, easy bruising or bleeding SKIN:  No rash or lesion. MUSCULOSKELETAL: No joint pain or arthritis.   NEUROLOGIC: No tingling, numbness, weakness.  PSYCHIATRY: No anxiety or depression.   MEDICATIONS AT HOME:   Prior to Admission medications   Medication Sig Start Date End Date Taking? Authorizing Provider  acetaminophen (TYLENOL) 650 MG CR tablet Take 650 mg by mouth 3 (three) times daily.    Yes [provider]  albuterol (ACCUNEB) 0.63 MG/3ML nebulizer solution Take 3 mLs by nebulization every 8 (eight) hours as needed for wheezing.    Yes [provider]  albuterol (PROVENTIL HFA;VENTOLIN HFA) 108 (90 Base) MCG/ACT inhaler Inhale 2 puffs into the lungs every 4 (four) hours as needed for wheezing or  shortness of breath.   Yes [provider]  atorvastatin (LIPITOR) 80 MG tablet Take 80 mg by mouth every evening.    Yes [provider]  cetirizine (ZYRTEC) 10 MG tablet Take 5 mg by mouth at bedtime.   Yes [provider]  cholecalciferol (VITAMIN D) 1000 units tablet Take 1,000 Units by mouth daily.   Yes [provider]  clopidogrel (PLAVIX) 75 MG tablet Take 75 mg by mouth daily.   Yes [provider]  diclofenac sodium (VOLTAREN) 1 % GEL Apply 2 g topically every 12 (twelve) hours as needed (for left knee pain).    Yes [provider]  diltiazem (CARDIZEM SR) 120 MG 12 hr capsule Take 120 mg by mouth 2 (two) times daily.   Yes [provider]  docusate sodium (COLACE) 100 MG capsule Take 100 mg by mouth daily.   Yes [provider]  FLUoxetine (PROZAC) 40 MG capsule Take 40 mg by mouth daily.    Yes [provider]  Fluticasone-Umeclidin-Vilant 100-62.5-25 MCG/INH AEPB Inhale 1 puff into the lungs daily.   Yes [provider]  furosemide (LASIX) 20 MG tablet Take 1 tablet (20 mg total) by mouth daily. 11/14/17  Yes Salary, Avel Peace, MD  gabapentin (NEURONTIN) 300 MG capsule Take 300 mg by mouth at bedtime.   Yes [provider]  hydrALAZINE (APRESOLINE) 25 MG tablet Take 25 mg by mouth 3 (three) times daily.   Yes [provider]  ipratropium (ATROVENT) 0.03 % nasal spray Place 2 sprays into both nostrils 2 (two) times daily.    Yes [provider]  ipratropium-albuterol (DUONEB) 0.5-2.5 (3) MG/3ML SOLN Take 3 mLs by nebulization 2 (two) times daily.    Yes [provider]  levETIRAcetam (KEPPRA XR) 500 MG 24 hr tablet Take 1 tablet (500 mg total) by mouth daily. 11/19/17  Yes Venancio Poisson, NP  linagliptin (TRADJENTA) 5 MG TABS tablet Take 5 mg by mouth daily.   Yes [provider]  LORazepam (ATIVAN) 2 MG/ML injection Inject 0.5 mLs (1 mg total) into  the muscle as needed for seizure. 02/28/18  Yes Ojie, Jude, MD  meclizine (ANTIVERT) 25 MG tablet Take 25 mg by mouth every 12 (twelve) hours as needed for dizziness.    Yes [provider]  Multiple Vitamin (MULTIVITAMIN WITH MINERALS) TABS tablet Take 1 tablet by mouth daily.   Yes [provider]  pantoprazole (PROTONIX) 40 MG tablet Take 40 mg by mouth daily.   Yes [provider]      VITAL SIGNS:  Blood pressure 111/68, pulse 82, temperature 98.9 F (37.2 C), temperature source Oral, resp. rate (!) 23, height 5\' 7"  (1.702 m), weight 117.9 kg, SpO2 92 %.  PHYSICAL EXAMINATION:  GENERAL:  78 y.o.-year-old  patient lying in the bed with no acute distress.  EYES: Pupils equal, round, reactive to light and accommodation. No scleral icterus. Extraocular muscles intact.  HEENT: Head atraumatic, normocephalic. Oropharynx and nasopharynx clear.  NECK:  Supple, no jugular venous distention. No thyroid enlargement, no tenderness.  LUNGS: Decreased breath sounds bilaterally, positive inspiratory and wheezing.  No rales,rhonchi or crepitation. No use of accessory muscles of respiration.  CARDIOVASCULAR: S1, S2 normal. No murmurs, rubs, or gallops.  ABDOMEN: Soft, nontender, nondistended. Bowel sounds present. No organomegaly or mass.  EXTREMITIES: 2+ pedal edema.  No cyanosis, or clubbing.  NEUROLOGIC: Cranial nerves II through XII are intact. Muscle strength 5/5 in all extremities. Sensation intact. Gait not checked.  PSYCHIATRIC: The patient is alert and oriented x 3.  SKIN: No rash, lesion, or ulcer.   LABORATORY PANEL:   CBC Recent Labs  Lab 04/13/18 1317  WBC 4.0  HGB 9.1*  HCT 29.2*  PLT 213   ------------------------------------------------------------------------------------------------------------------  Chemistries  Recent Labs  Lab 04/13/18 1317  NA 137  K 4.2  CL 100  CO2 28  GLUCOSE 109*  BUN 31*  CREATININE 1.96*  CALCIUM 8.8*  AST 29   ALT 19  ALKPHOS 71  BILITOT 0.9   ------------------------------------------------------------------------------------------------------------------  Cardiac Enzymes Recent Labs  Lab 04/13/18 1317  TROPONINI <0.03   ------------------------------------------------------------------------------------------------------------------  RADIOLOGY:  Dg Chest Port 1 View  Result Date: 04/13/2018 CLINICAL DATA:  Cough and shortness of breath. EXAM: PORTABLE CHEST 1 VIEW COMPARISON:  02/24/2018 and prior radiographs FINDINGS: Cardiomegaly again noted. Mild chronic peribronchial thickening is unchanged. There is no evidence of focal airspace disease, pulmonary edema, suspicious pulmonary nodule/mass, pleural effusion, or pneumothorax. No acute bony abnormalities are identified. IMPRESSION: Cardiomegaly without evidence of acute cardiopulmonary disease. Electronically Signed   By: Margarette Canada M.D.   On: 04/13/2018 14:52    EKG:   Normal sinus rhythm 80 bpm  IMPRESSION AND PLAN:   1.  Influenza A positive infection with wheezing.  Renally dose Tamiflu ordered.  Droplet precautions.  I will hold off on IV steroids because people with the flu usually get worse with the steroids.  Give steroid nebulizers DuoNeb and Brovana nebulizers. 2.  Acute cystitis.  Urinalysis positive.  Given Rocephin in the ED.  Since urine culture was not ordered I added on a urine culture. 3.  COPD exacerbation, chronic respiratory failure on 2 L of oxygen.  Nebulizer treatments.  Try to hold off on systemic steroids because the patient has influenza A positive infection. 4.  Type 2 diabetes mellitus put on sliding scale.  Continue Januvia 5.  Essential hypertension.  Continue usual medications 6.  History of seizures on Keppra 7.  History of stroke on aspirin Plavix 8.  Chronic kidney disease stage IV. 9.  Chronic diastolic congestive heart failure.  No signs of heart failure currently 10.  Chronic anemia.  Last  ferritin was low which goes along with iron deficiency anemia.  Her B12 level was also very low last time that was tested.  We will send off another ferritin and B12 level. 36.  Social worker consultation since she is from a facility and influenza positive.   All the records are reviewed and case discussed with ED provider. Management plans discussed with the patient, family and they are in agreement.  CODE STATUS: Full code  TOTAL TIME TAKING CARE OF THIS PATIENT: 50 minutes, including ACP time.    Loletha Grayer M.D on 04/13/2018 at 6:09 PM  Between  7am to 6pm - Pager - [phone]  After 6pm call admission pager [phone]  [facility]  [phone]  CC: Primary care physician; Valerie Roys, DO

## 2018-04-13 NOTE — ED Provider Notes (Signed)
Patient with UTI as well as influenza A.  Again requesting to stay into the hospital.  I will discuss with the hospitalist for admission.   Earleen Newport, MD 04/13/18 [phone]

## 2018-04-13 NOTE — ED Triage Notes (Signed)
Patient received Duoneb and Albuterol per EMS

## 2018-04-13 NOTE — ED Notes (Signed)
In and out cath done by izzy RN. Sterile technique maintained.

## 2018-04-13 NOTE — ED Provider Notes (Signed)
[facility] Emergency Department Provider Note   ____________________________________________   First MD Initiated Contact with Patient 04/13/18 1249     (approximate)  I have reviewed the triage vital signs and the nursing notes.   HISTORY  Chief Complaint Shortness of Breath   HPI Katherine [name] is a 78 y.o. female who is been complaining of cough productive of yellow-green phlegm feeling achy having a headache and some chest achiness she told me since 2 days ago she told the nurse since Monday.  She also told the nurse she had some more swelling in her legs than usual.  She told me she had only the usual amount of swelling.   Past Medical History:  Diagnosis Date  . Angioedema   . Anxiety   . Anxiety and depression   . CHF (congestive heart failure) ([place])   . CHF (congestive heart failure) ([place])   . Chronic constipation   . Chronic kidney disease    stage 3  . COPD (chronic obstructive pulmonary disease) ([place])   . Diabetes mellitus without complication ([place])   . Gallstones   . GERD (gastroesophageal reflux disease)   . Hypertension   . Left ventricular hypertrophy   . Osteoarthritis   . Prurigo nodularis   . Seizures ([place])   . Stroke ([place])   . Tobacco abuse   . Vitamin D deficiency disease     Patient Active Problem List   Diagnosis Date Noted  . Acute on chronic congestive heart failure ([place])   . COPD with acute exacerbation ([place]) 02/24/2018  . Chronic diastolic heart failure ([place]) 12/31/2017  . Lymphedema 12/31/2017  . COPD exacerbation ([place]) 11/30/2017  . Urinary tract infection 11/22/2017  . 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 Diabetes mellitus type 2, uncomplicated ([place]) 62/94/7654  . Pressure injury of skin 10/29/2017  . Acute kidney injury superimposed on CKD ([place]) 10/24/2017  . Severe sepsis ([place]) 10/24/2017  . Possible Seizures ([place]) 10/08/2017  . Hyperlipemia 10/08/2017  . 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 Stroke-like episode ([place]) s/p IV tpa 10/04/2017  . Palliative care by specialist   . Elevated rheumatoid factor 09/05/2017  . Frequent hospital admissions 08/22/2017  . Aphasia 06/28/2017  . Hand pain 03/21/2017  . Dry skin 03/21/2017  . Pain in finger of left hand 09/12/2016  . Chronic fatigue 06/12/2016  . Left knee pain 06/12/2016  . Goals of care, counseling/discussion 03/13/2016  . Primary localized osteoarthritis of right knee 02/08/2016  . OSA (obstructive sleep apnea) 09/16/2015  . Rotator cuff syndrome 09/07/2015  . 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 Sleep disturbance 04/14/2015  . Coronary artery disease 03/14/2015  . 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 Acute respiratory failure with hypoxia ([place]) 09/16/2014  . 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 Anxiety   . Tobacco abuse   . Prurigo nodularis   . GERD (gastroesophageal reflux disease)   . COPD (chronic obstructive pulmonary disease) ([place])   . Osteoarthritis   . Vitamin D deficiency disease   . Chronic constipation   . CKD (chronic kidney disease), stage III ([place])   . Essential hypertension 05/20/2013  . Morbid obesity ([place]) 05/20/2013    Past Surgical History:  Procedure Laterality Date  . CHOLECYSTECTOMY    . 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   Procedure: TOTAL KNEE ARTHROPLASTY;  Surgeon: Hessie Knows, MD;  Location: [facility] ORS;  Service: Orthopedics;  Laterality: Right;    Prior to Admission medications   Medication Sig Start Date End Date Taking? Authorizing Provider  acetaminophen (TYLENOL) 650 MG CR tablet Take 650 mg by mouth 3 (three) times daily.    Yes [provider]  albuterol (ACCUNEB) 0.63 MG/3ML nebulizer solution Take 3 mLs by nebulization every 8 (eight) hours as needed for wheezing.    Yes [provider]  albuterol (PROVENTIL HFA;VENTOLIN HFA) 108 (90 Base) MCG/ACT inhaler Inhale 2 puffs into the  lungs every 4 (four) hours as needed for wheezing or shortness of breath.   Yes [provider]  atorvastatin (LIPITOR) 80 MG tablet Take 80 mg by mouth every evening.    Yes [provider]  cetirizine (ZYRTEC) 10 MG tablet Take 5 mg by mouth at bedtime.   Yes [provider]  cholecalciferol (VITAMIN D) 1000 units tablet Take 1,000 Units by mouth daily.   Yes [provider]  clopidogrel (PLAVIX) 75 MG tablet Take 75 mg by mouth daily.   Yes [provider]  diclofenac sodium (VOLTAREN) 1 % GEL Apply 2 g topically every 12 (twelve) hours as needed (for left knee pain).    Yes [provider]  diltiazem (CARDIZEM SR) 120 MG 12 hr capsule Take 120 mg by mouth 2 (two) times daily.   Yes [provider]  docusate sodium (COLACE) 100 MG capsule Take 100 mg by mouth daily.   Yes [provider]  FLUoxetine (PROZAC) 40 MG capsule Take 40 mg by mouth daily.    Yes [provider]  Fluticasone-Umeclidin-Vilant 100-62.5-25 MCG/INH AEPB Inhale 1 puff into the lungs daily.   Yes [provider]  furosemide (LASIX) 20 MG tablet Take 1 tablet (20 mg total) by mouth daily. 11/14/17  Yes Salary, Avel Peace, MD  gabapentin (NEURONTIN) 300 MG capsule Take 300 mg by mouth at bedtime.   Yes [provider]  hydrALAZINE (APRESOLINE) 25 MG tablet Take 25 mg by mouth 3 (three) times daily.   Yes [provider]  ipratropium (ATROVENT) 0.03 % nasal spray Place 2 sprays into both nostrils 2 (two) times daily.    Yes [provider]  ipratropium-albuterol (DUONEB) 0.5-2.5 (3) MG/3ML SOLN Take 3 mLs by nebulization 2 (two) times daily.    Yes [provider]  levETIRAcetam (KEPPRA XR) 500 MG 24 hr tablet Take 1 tablet (500 mg total) by mouth daily. 11/19/17  Yes Venancio Poisson, NP  linagliptin (TRADJENTA) 5 MG TABS tablet Take 5 mg by mouth daily.   Yes [provider]  LORazepam (ATIVAN)  2 MG/ML injection Inject 0.5 mLs (1 mg total) into the muscle as needed for seizure. 02/28/18  Yes Ojie, Jude, MD  meclizine (ANTIVERT) 25 MG tablet Take 25 mg by mouth every 12 (twelve) hours as needed for dizziness.    Yes [provider]  Multiple Vitamin (MULTIVITAMIN WITH MINERALS) TABS tablet Take 1 tablet by mouth daily.   Yes [provider]  pantoprazole (PROTONIX) 40 MG tablet Take 40 mg by mouth daily.   Yes [provider]    Allergies Bee venom and Enalapril maleate  Family History  Problem Relation Age of Onset  . Alcohol abuse Mother   . Sickle cell anemia Daughter   . Hypertension Son   . Cancer Neg Hx   . COPD Neg Hx   . Diabetes Neg Hx   . Heart disease Neg Hx   . Stroke Neg Hx  Social History Social History   Tobacco Use  . Smoking status: Former Smoker    Packs/day: 0.50    Years: 60.00    Pack years: 30.00    Types: Cigarettes  . Smokeless tobacco: Never Used  . Tobacco comment: quite 90mo ago-03/26/18  Substance Use Topics  . Alcohol use: No    Alcohol/week: 0.0 standard drinks    Comment: rare  . Drug use: No    Review of Systems  Constitutional: No fever/chills Eyes: No visual changes. ENT: No sore throat. Cardiovascular: Denies chest pain. Respiratory: Denies shortness of breath. Gastrointestinal: No abdominal pain.  No nausea, no vomiting.  No diarrhea.  No constipation. Genitourinary: Negative for dysuria. Musculoskeletal: Negative for back pain. Skin: Negative for rash. Neurological: Negative for headaches, focal weakness   ____________________________________________   PHYSICAL EXAM:  VITAL SIGNS: ED Triage Vitals  Enc Vitals Group     BP 04/13/18 1301 (!) 109/58     Pulse Rate 04/13/18 1301 89     Resp 04/13/18 1301 (!) 23     Temp 04/13/18 1301 98.9 F (37.2 C)     Temp Source 04/13/18 1301 Oral     SpO2 04/13/18 1301 96 %     Weight 04/13/18 1255 260 lb (117.9 kg)     Height 04/13/18 1255 5'  7" (1.702 m)     Head Circumference --      Peak Flow --      Pain Score 04/13/18 1254 0     Pain Loc --      Pain Edu? --      Excl. in [place]? --     Constitutional: Alert and oriented. Well appearing and in no acute distress. Eyes: Conjunctivae are normal. . Head: Atraumatic. Nose: No congestion/rhinnorhea. Mouth/Throat: Mucous membranes are moist.  Oropharynx non-erythematous. Neck: No stridor.   Cardiovascular: Normal rate, regular rhythm. Grossly normal heart sounds.  Good peripheral circulation. Respiratory: Normal respiratory effort.  No retractions. Lungs CTAB. Gastrointestinal: Soft and nontender. No distention. No abdominal bruits. No CVA tenderness. }Musculoskeletal: No lower extremity tenderness nor edema.   Neurologic:  Normal speech and language. No gross focal neurologic deficits are appreciated. N Skin:  Skin is warm, dry and intact. No rash noted.   ____________________________________________   LABS (all labs ordered are listed, but only abnormal results are displayed)  Labs Reviewed  INFLUENZA PANEL BY PCR (TYPE A & B) - Abnormal; Notable for the following components:      Result Value   Influenza A By PCR POSITIVE (*)    All other components within normal limits  COMPREHENSIVE METABOLIC PANEL - Abnormal; Notable for the following components:   Glucose, Bld 109 (*)    BUN 31 (*)    Creatinine, Ser 1.96 (*)    Calcium 8.8 (*)    Albumin 3.0 (*)    GFR calc non Af Amer 24 (*)    GFR calc Af Amer 28 (*)    All other components within normal limits  CBC WITH DIFFERENTIAL/PLATELET - Abnormal; Notable for the following components:   RBC 3.38 (*)    Hemoglobin 9.1 (*)    HCT 29.2 (*)    RDW 17.4 (*)    All other components within normal limits  URINALYSIS, COMPLETE (UACMP) WITH MICROSCOPIC - Abnormal; Notable for the following components:   Color, Urine YELLOW (*)    APPearance HAZY (*)    Leukocytes,Ua MODERATE (*)    WBC, UA >50 (*)  Bacteria, UA MANY  (*)    All other components within normal limits  BRAIN NATRIURETIC PEPTIDE  TROPONIN I   ____________________________________________  EKG  EKG read and interpreted by me shows extreme right axis probable limb lead reversal as the cause of this rate is 89 no acute ST-T changes ____________________________________________  RADIOLOGY  ED MD interpretation:    Official radiology report(s): Dg Chest Port 1 View  Result Date: 04/13/2018 CLINICAL DATA:  Cough and shortness of breath. EXAM: PORTABLE CHEST 1 VIEW COMPARISON:  02/24/2018 and prior radiographs FINDINGS: Cardiomegaly again noted. Mild chronic peribronchial thickening is unchanged. There is no evidence of focal airspace disease, pulmonary edema, suspicious pulmonary nodule/mass, pleural effusion, or pneumothorax. No acute bony abnormalities are identified. IMPRESSION: Cardiomegaly without evidence of acute cardiopulmonary disease. Electronically Signed   By: Margarette Canada M.D.   On: 04/13/2018 14:52    ____________________________________________   PROCEDURES  Procedure(s) performed (including Critical Care):  Procedures   ____________________________________________   INITIAL IMPRESSION / ASSESSMENT AND PLAN / ED COURSE  Patient getting some more IV fluid another nebulizer treatment in the Rocephin IV.  If she continues to improve anticipate discharge.  Dr. Jimmye Norman will watch her next find this Po.         ____________________________________________   FINAL CLINICAL IMPRESSION(S) / ED DIAGNOSES  Final diagnoses:  Influenza  Urinary tract infection without hematuria, site unspecified     ED Discharge Orders    None       Note:  This document was prepared using Dragon voice recognition software and may include unintentional dictation errors.    Nena Polio, MD 04/13/18 [phone]

## 2018-04-14 LAB — GLUCOSE, CAPILLARY: Glucose-Capillary: 112 mg/dL — ABNORMAL HIGH (ref 70–99)

## 2018-04-14 LAB — MRSA PCR SCREENING: MRSA by PCR: POSITIVE — AB

## 2018-04-14 LAB — VITAMIN B12: Vitamin B-12: 195 pg/mL (ref 180–914)

## 2018-04-14 MED ORDER — MUPIROCIN 2 % EX OINT
1.0000 "application " | TOPICAL_OINTMENT | Freq: Two times a day (BID) | CUTANEOUS | Status: DC
  Administered 2018-04-15: 1 via NASAL
  Filled 2018-04-14: qty 22

## 2018-04-14 MED ORDER — CHLORHEXIDINE GLUCONATE CLOTH 2 % EX PADS
6.0000 | MEDICATED_PAD | Freq: Every day | CUTANEOUS | Status: DC
  Administered 2018-04-15: 6 via TOPICAL

## 2018-04-14 NOTE — Progress Notes (Signed)
Family Meeting Note  Advance Directive:yes  Today a meeting took place with the Patient.  Patient is able to participate   The following clinical team members were present during this meeting:MD  The following were discussed:Patient's diagnosis:copd, uti, influenza , Patient's progosis: Unable to determine and Goals for treatment: Full Code  Additional follow-up to be provided: prn  Time spent during discussion:20 minutes  Gorden Harms, MD

## 2018-04-14 NOTE — NC FL2 (Signed)
[facility] LEVEL OF CARE SCREENING TOOL     IDENTIFICATION  Patient Name: Katherine [name] Birthdate: 02-May-1940 Sex: female Admission Date (Current Location): 04/13/2018  [place] and [place] Number:  Engineering geologist and Address:  [facility], [address] [place], [place] [address]      Provider Number: [id number]  Attending Physician Name and Address:  Gorden Harms, MD  Relative Name and Phone Number:       Current Level of Care: Hospital Recommended Level of Care: [facility] Prior Approval Number:    Date Approved/Denied:   PASRR Number:    Discharge Plan: SNF    Current Diagnoses: Patient Active Problem List   Diagnosis Date Noted  . Influenza A 04/13/2018  . Acute on chronic congestive heart failure ([place])   . COPD with acute exacerbation ([place]) 02/24/2018  . Chronic diastolic heart failure ([place]) 12/31/2017  . Lymphedema 12/31/2017  . COPD exacerbation ([place]) 11/30/2017  . Urinary tract infection 11/22/2017  . 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[place]) 11/16/2017  . Diabetes mellitus type 2, uncomplicated ([place]) 90/24/0973  . Pressure injury of skin 10/29/2017  . Acute kidney injury superimposed on CKD ([place]) 10/24/2017  . Severe sepsis ([place]) 10/24/2017  . Possible Seizures ([place]) 10/08/2017  . Hyperlipemia 10/08/2017  . 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 Stroke-like episode ([place]) s/p IV tpa 10/04/2017  . Palliative care by specialist   . Elevated rheumatoid factor 09/05/2017  . Frequent hospital admissions 08/22/2017  . Aphasia 06/28/2017  . Hand pain 03/21/2017  . Dry skin 03/21/2017  . Pain in finger of left hand 09/12/2016  . Chronic fatigue 06/12/2016  . Left knee pain 06/12/2016  . Goals of care, counseling/discussion 03/13/2016  . Primary localized osteoarthritis of right knee 02/08/2016  . OSA (obstructive sleep apnea) 09/16/2015  . Rotator cuff syndrome 09/07/2015   . 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 Sleep disturbance 04/14/2015  . Coronary artery disease 03/14/2015  . 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 Acute respiratory failure with hypoxia ([place]) 09/16/2014  . 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 Anxiety   . Tobacco abuse   . Prurigo nodularis   . GERD (gastroesophageal reflux disease)   . COPD (chronic obstructive pulmonary disease) ([place])   . Osteoarthritis   . Vitamin D deficiency disease   . Chronic constipation   . CKD (chronic kidney disease), stage III ([place])   . Essential hypertension 05/20/2013  . Morbid obesity ([place]) 05/20/2013    Orientation RESPIRATION BLADDER Height & Weight     Self, Place  O2, Normal(2 liters) Continent Weight: 261 lb 4.8 oz (118.5 kg) Height:  5\' 6"  (167.6 cm)  BEHAVIORAL SYMPTOMS/MOOD NEUROLOGICAL BOWEL NUTRITION STATUS  (none) Convulsions/Seizures Continent Diet  AMBULATORY STATUS COMMUNICATION OF NEEDS Skin   Total Care Verbally Normal                       Personal Care Assistance Level of Assistance  Bathing, Dressing, Feeding Bathing Assistance: Maximum assistance Feeding assistance: Limited assistance Dressing Assistance: Maximum assistance     Functional Limitations Info  Sight, Hearing Sight Info: Impaired Hearing Info: Impaired      SPECIAL CARE FACTORS FREQUENCY                       Contractures Contractures Info: Not present    Additional Factors Info  Code Status Code Status Info: full  Current Medications (04/14/2018):  This is the current hospital active medication list Current Facility-Administered Medications  Medication Dose Route Frequency Provider Last Rate Last Dose  . acetaminophen (TYLENOL) tablet 650 mg  650 mg Oral TID Loletha Grayer, MD   650 mg at 04/14/18 0949  . arformoterol (BROVANA) nebulizer solution 15 mcg  15 mcg Nebulization BID Wieting, Richard, MD      . atorvastatin (LIPITOR) tablet 80 mg  80 mg Oral QPM Loletha Grayer, MD   80 mg at 04/13/18 2034  . budesonide (PULMICORT) nebulizer solution 0.5 mg  0.5 mg Nebulization BID Loletha Grayer, MD   0.5 mg at 04/14/18 0750  . cefTRIAXone (ROCEPHIN) 1 g in sodium chloride 0.9 % 100 mL IVPB  1 g Intravenous Q24H Wieting, Richard, MD      . Chlorhexidine Gluconate Cloth 2 % PADS 6 each  6 each Topical Q0600 Salary, Montell D, MD      . cholecalciferol (VITAMIN D3) tablet 1,000 Units  1,000 Units Oral Daily Loletha Grayer, MD   1,000 Units at 04/14/18 0949  . clopidogrel (PLAVIX) tablet 75 mg  75 mg Oral Daily Loletha Grayer, MD   75 mg at 04/14/18 0949  . diltiazem (CARDIZEM SR) 12 hr capsule 120 mg  120 mg Oral BID Loletha Grayer, MD   120 mg at 04/14/18 0950  . docusate sodium (COLACE) capsule 100 mg  100 mg Oral Daily Loletha Grayer, MD   100 mg at 04/14/18 0949  . FLUoxetine (PROZAC) capsule 40 mg  40 mg Oral Daily Loletha Grayer, MD   40 mg at 04/14/18 0950  . gabapentin (NEURONTIN) capsule 300 mg  300 mg Oral QHS Wieting, Richard, MD      . hydrALAZINE (APRESOLINE) tablet 25 mg  25 mg Oral TID Loletha Grayer, MD   25 mg at 04/14/18 0950  . ipratropium (ATROVENT) 0.03 % nasal spray 2 spray  2 spray Each Nare BID Wieting, Richard, MD      . ipratropium-albuterol (DUONEB) 0.5-2.5 (3) MG/3ML nebulizer solution 3 mL  3 mL Nebulization Q6H Wieting, Richard, MD   3 mL at 04/14/18 0750  . levETIRAcetam (KEPPRA XR) 24 hr tablet 500 mg  500 mg Oral Daily Loletha Grayer, MD   500 mg at 04/14/18 0950  . linagliptin (TRADJENTA) tablet 5 mg  5 mg Oral Daily Loletha Grayer, MD   5 mg at 04/14/18 0950  . loratadine (CLARITIN) tablet 10 mg  10 mg Oral Daily Wieting, Richard, MD      . meclizine (ANTIVERT) tablet 25 mg  25 mg Oral Q12H PRN Loletha Grayer, MD      . multivitamin with minerals tablet 1 tablet  1 tablet Oral Daily Loletha Grayer, MD   1 tablet at 04/14/18 [phone]  . mupirocin ointment (BACTROBAN) 2 % 1 application  1 application Nasal BID  Salary, Montell D, MD      . oseltamivir (TAMIFLU) capsule 30 mg  30 mg Oral BID Loletha Grayer, MD   30 mg at 04/14/18 0949  . pantoprazole (PROTONIX) EC tablet 40 mg  40 mg Oral Daily Loletha Grayer, MD   40 mg at 04/14/18 1601     Discharge Medications: Please see discharge summary for a list of discharge medications.  Relevant Imaging Results:  Relevant Lab Results:   Additional Information    Shela Leff, LCSW

## 2018-04-14 NOTE — Clinical Social Work Note (Signed)
Clinical Social Work Assessment  Patient Details  Name: Katherine [name] MRN: [id number] Date of Birth: January 17, 1941  Date of referral:  04/14/18               Reason for consult:  Discharge Planning                Permission sought to share information with:  Facility Art therapist granted to share information::  Yes, Verbal Permission Granted  Name::        Agency::     Relationship::     Contact Information:     Housing/Transportation Living arrangements for the past 2 months:  [place] of Information:  Patient Patient Interpreter Needed:  None Criminal Activity/Legal Involvement Pertinent to Current Situation/Hospitalization:  No - Comment as needed Significant Relationships:  Adult Children, Spouse Lives with:  Facility Resident Do you feel safe going back to the place where you live?  Yes Need for family participation in patient care:     Care giving concerns:  Patient is a long term care resident at [facility].   Social Worker assessment / plan:  CSW met with patient this afternoon regarding her being from [facility]. Patient confirmed she lives there and confirmed she wishes to return.   CSW spoke with Claiborne Billings at [facility] and they can take patient back at discharge.   Employment status:    Insurance information:    PT Recommendations:    Information / Referral to community resources:     Patient/Family's Response to care:  Patient appeared to appreciate CSW's assistance.  Patient/Family's Understanding of and Emotional Response to Diagnosis, Current Treatment, and Prognosis:  Uncertain if patient was oriented this afternoon as she mostly answered, "uh huh" to all of CSW's questions.   Emotional Assessment Appearance:  Appears stated age Attitude/Demeanor/Rapport:  (pleasant) Affect (typically observed):  Calm Orientation:  Oriented to Self, Oriented to Place Alcohol / Substance use:  Not  Applicable Psych involvement (Current and /or in the community):  No (Comment)  Discharge Needs  Concerns to be addressed:  Care Coordination Readmission within the last 30 days:  No Current discharge risk:  None Barriers to Discharge:  Continued Medical Work up   [place], [place] 04/14/2018, 2:22 PM

## 2018-04-14 NOTE — Progress Notes (Signed)
[facility] NAME: Katherine [name]    MR#:  [id number]  DATE OF BIRTH:  Apr 15, 1940  SUBJECTIVE:  CHIEF COMPLAINT:   Patient states that she feels slightly better, patient now MRSA positive-start contact precautions and discussion with nursing staff, follow-up on outstanding cultures, aspiration/fall precautions while in house REVIEW OF SYSTEMS:  CONSTITUTIONAL: No fever, fatigue or weakness.  EYES: No blurred or double vision.  EARS, NOSE, AND THROAT: No tinnitus or ear pain.  RESPIRATORY: No cough, shortness of breath, wheezing or hemoptysis.  CARDIOVASCULAR: No chest pain, orthopnea, edema.  GASTROINTESTINAL: No nausea, vomiting, diarrhea or abdominal pain.  GENITOURINARY: No dysuria, hematuria.  ENDOCRINE: No polyuria, nocturia,  HEMATOLOGY: No anemia, easy bruising or bleeding SKIN: No rash or lesion. MUSCULOSKELETAL: No joint pain or arthritis.   NEUROLOGIC: No tingling, numbness, weakness.  PSYCHIATRY: No anxiety or depression.   ROS  DRUG ALLERGIES:   Allergies  Allergen Reactions  . Bee Venom Swelling  . Enalapril Maleate Swelling    VITALS:  Blood pressure (!) 97/59, pulse 88, temperature 98.8 F (37.1 C), temperature source Oral, resp. rate (!) 21, height 5\' 6"  (1.676 m), weight 118.5 kg, SpO2 94 %.  PHYSICAL EXAMINATION:  GENERAL:  78 y.o.-year-old patient lying in the bed with no acute distress.  EYES: Pupils equal, round, reactive to light and accommodation. No scleral icterus. Extraocular muscles intact.  HEENT: Head atraumatic, normocephalic. Oropharynx and nasopharynx clear.  NECK:  Supple, no jugular venous distention. No thyroid enlargement, no tenderness.  LUNGS: Normal breath sounds bilaterally, no wheezing, rales,rhonchi or crepitation. No use of accessory muscles of respiration.  CARDIOVASCULAR: S1, S2 normal. No murmurs, rubs, or gallops.  ABDOMEN: Soft, nontender, nondistended. Bowel sounds present.  No organomegaly or mass.  EXTREMITIES: No pedal edema, cyanosis, or clubbing.  NEUROLOGIC: Cranial nerves II through XII are intact. Muscle strength 5/5 in all extremities. Sensation intact. Gait not checked.  PSYCHIATRIC: The patient is alert and oriented x 3.  SKIN: No obvious rash, lesion, or ulcer.   Physical Exam LABORATORY PANEL:   CBC Recent Labs  Lab 04/13/18 1317  WBC 4.0  HGB 9.1*  HCT 29.2*  PLT 213   ------------------------------------------------------------------------------------------------------------------  Chemistries  Recent Labs  Lab 04/13/18 1317  NA 137  K 4.2  CL 100  CO2 28  GLUCOSE 109*  BUN 31*  CREATININE 1.96*  CALCIUM 8.8*  AST 29  ALT 19  ALKPHOS 71  BILITOT 0.9   ------------------------------------------------------------------------------------------------------------------  Cardiac Enzymes Recent Labs  Lab 04/13/18 1317  TROPONINI <0.03   ------------------------------------------------------------------------------------------------------------------  RADIOLOGY:  Dg Chest Port 1 View  Result Date: 04/13/2018 CLINICAL DATA:  Cough and shortness of breath. EXAM: PORTABLE CHEST 1 VIEW COMPARISON:  02/24/2018 and prior radiographs FINDINGS: Cardiomegaly again noted. Mild chronic peribronchial thickening is unchanged. There is no evidence of focal airspace disease, pulmonary edema, suspicious pulmonary nodule/mass, pleural effusion, or pneumothorax. No acute bony abnormalities are identified. IMPRESSION: Cardiomegaly without evidence of acute cardiopulmonary disease. Electronically Signed   By: Margarette Canada M.D.   On: 04/13/2018 14:52    ASSESSMENT AND PLAN:  *Acute  Influenza A  infection  Slightly improved  Continue renally dosed Tamiflu, supportive care   *Acute probable cystitis  Empiric Rocephin for 3-day course and follow-up on cultures   *Acute on COPD exacerbation Largely resolved-exacerbated by influenza A  infection chronic respiratory failure on 2 L of oxygen Continue aggressive pulmonary toilet with bronchodilator therapy, mucolytic agents  *  Chronic hypoxic respiratory failure At baseline 2 L via nasal cannula continuous  *Chronic diabetes mellitus type 2 Stable on current regiment  *Chronic extreme morbid obesity Most likely secondary to excess calories Lifestyle modification recommended  *Chronic hypertension Stable on current regiment  *History of epilepsy Stable Continue seizure precautions and Keppra   *Chronic kidney disease stage IV At baseline Avoid nephrotoxic agents  *Chronic diastolic congestive heart failure without exacerbation Stable on current regiment which will be continued  *Chronic B12/iron deficiency anemia B12 supplementation, ferritin level normal range  Disposition Home in 1 to 2 days barring any complications  All the records are reviewed and case discussed with Care Management/Social Workerr. Management plans discussed with the patient, family and they are in agreement.  CODE STATUS: full  TOTAL TIME TAKING CARE OF THIS PATIENT: 35 minutes.     POSSIBLE D/C IN 1-2 DAYS, DEPENDING ON CLINICAL CONDITION.   Avel Peace Salary M.D on 04/14/2018   Between 7am to 6pm - Pager - [phone]  After 6pm go to [email or web address] - password [email or web address] [facility]  Office  [phone]  CC: Primary care physician; Valerie Roys, DO  Note: This dictation was prepared with Dragon dictation along with smaller phrase technology. Any transcriptional errors that result from this process are unintentional.

## 2018-04-14 NOTE — Plan of Care (Signed)

## 2018-04-15 LAB — BASIC METABOLIC PANEL
Anion gap: 9 (ref 5–15)
BUN: 26 mg/dL — ABNORMAL HIGH (ref 8–23)
CO2: 29 mmol/L (ref 22–32)
Calcium: 8.8 mg/dL — ABNORMAL LOW (ref 8.9–10.3)
Chloride: 103 mmol/L (ref 98–111)
Creatinine, Ser: 1.85 mg/dL — ABNORMAL HIGH (ref 0.44–1.00)
GFR calc Af Amer: 30 mL/min — ABNORMAL LOW (ref >60)
GFR calc non Af Amer: 26 mL/min — ABNORMAL LOW (ref >60)
Glucose, Bld: 97 mg/dL (ref 70–99)
Potassium: 3.7 mmol/L (ref 3.5–5.1)
Sodium: 141 mmol/L (ref 135–145)

## 2018-04-15 MED ORDER — PREDNISONE 50 MG PO TABS: ORAL_TABLET | ORAL | 0 refills | Status: DC

## 2018-04-15 MED ORDER — IPRATROPIUM-ALBUTEROL 0.5-2.5 (3) MG/3ML IN SOLN
3.0000 mL | Freq: Three times a day (TID) | RESPIRATORY_TRACT | Status: DC
  Administered 2018-04-15: 15:00:00 3 mL via RESPIRATORY_TRACT
  Filled 2018-04-15: qty 3

## 2018-04-15 MED ORDER — OSELTAMIVIR PHOSPHATE 30 MG PO CAPS: 30.0000 mg | ORAL_CAPSULE | Freq: Two times a day (BID) | ORAL | 0 refills | Status: DC

## 2018-04-15 MED ORDER — NYSTATIN 100000 UNIT/ML MT SUSP: 5.0000 mL | Freq: Four times a day (QID) | OROMUCOSAL | 0 refills | Status: DC

## 2018-04-15 MED ORDER — NYSTATIN 100000 UNIT/ML MT SUSP
10.0000 mL | Freq: Four times a day (QID) | OROMUCOSAL | Status: DC
  Administered 2018-04-15 (×2): 1000000 [IU] via OROMUCOSAL
  Filled 2018-04-15 (×2): qty 10

## 2018-04-15 MED ORDER — CEFDINIR 300 MG PO CAPS: 300.0000 mg | ORAL_CAPSULE | Freq: Two times a day (BID) | ORAL | 0 refills | Status: DC

## 2018-04-15 MED ORDER — ACETAMINOPHEN 325 MG PO TABS
650.0000 mg | ORAL_TABLET | Freq: Four times a day (QID) | ORAL | Status: DC | PRN
  Administered 2018-04-15: 650 mg via ORAL

## 2018-04-15 MED ORDER — ALBUTEROL SULFATE (2.5 MG/3ML) 0.083% IN NEBU: 2.5000 mg | INHALATION_SOLUTION | RESPIRATORY_TRACT | Status: DC | PRN

## 2018-04-15 NOTE — Discharge Planning (Signed)
Patient IV removed.  RN assessment and VS revealed stability for DC to [facility].  Discharge papers printed and placed in facility packet with printed scripts.  Report called and s/w Administrator, Civil Service, LPN.  EMS contacted to transport. Waiting on arrival.

## 2018-04-15 NOTE — Discharge Summary (Signed)
[facility] [place] NAME: Katherine [name]    MR#:  [id number]  DATE OF BIRTH:  12-03-1940  DATE OF ADMISSION:  04/13/2018 ADMITTING PHYSICIAN: Loletha Grayer, MD  DATE OF DISCHARGE: No discharge date for patient encounter.  PRIMARY CARE PHYSICIAN: Park Liter P, DO    ADMISSION DIAGNOSIS:  SOB (shortness of breath) [R06.02] Influenza [J11.1] Urinary tract infection without hematuria, site unspecified [N39.0]  DISCHARGE DIAGNOSIS:  Active Problems:   Influenza A   SECONDARY DIAGNOSIS:   Past Medical History:  Diagnosis Date  . Angioedema   . Anxiety   . Anxiety and depression   . CHF (congestive heart failure) ([place])   . CHF (congestive heart failure) ([place])   . Chronic constipation   . Chronic kidney disease    stage 3  . COPD (chronic obstructive pulmonary disease) ([place])   . Diabetes mellitus without complication ([place])   . Gallstones   . GERD (gastroesophageal reflux disease)   . Hypertension   . Left ventricular hypertrophy   . Osteoarthritis   . Prurigo nodularis   . Seizures ([place])   . Stroke ([place])   . Tobacco abuse   . Vitamin D deficiency disease     HOSPITAL COURSE:  *Acute  Influenza A infection  Resolving Treated with renally dosed Tamiflu, supportive care   *Acute probable cystitis  Resolving Treated with empiric Rocephin while in house-cultures pending at the time of discharge, patient discharged on Omnicef to complete antibiotic course, primary care provider to follow-up on outstanding cultures to ensure appropriate coverage   *Acute on COPD exacerbation Resolved exacerbated by influenza A infection Katherine [name] weaned back to her O2 requirement of 2 L via nasal cannula chronically at home  Provided aggressive pulmonary toilet with bronchodilator therapy, mucolytic agents, empiric antibiotics per above  *Chronic hypoxic respiratory failure Table At baseline 2 L via nasal cannula  continuous  *Chronic diabetes mellitus type 2 Stable on current regiment  *Chronic extreme morbid obesity Most likely secondary to excess calories Lifestyle modification recommended  *Chronic hypertension Stable on current regiment  *History of epilepsy Stable Continued seizure precautions and Keppra   *Chronic kidney disease stage IV At baseline Avoid nephrotoxic agents  *Chronic diastolic congestive heart failure without exacerbation Stable on current regiment which will be continued  *Chronic B12/iron deficiency anemia B12 supplementation, ferritin level normal range   DISCHARGE CONDITIONS:   stable  CONSULTS OBTAINED:    DRUG ALLERGIES:   Allergies  Allergen Reactions  . Bee Venom Swelling  . Enalapril Maleate Swelling    DISCHARGE MEDICATIONS:   Allergies as of 04/15/2018      Reactions   Bee Venom Swelling   Enalapril Maleate Swelling      Medication List    TAKE these medications   acetaminophen 650 MG CR tablet Commonly known as:  TYLENOL Take 650 mg by mouth 3 (three) times daily.   albuterol 108 (90 Base) MCG/ACT inhaler Commonly known as:  PROVENTIL HFA;VENTOLIN HFA Inhale 2 puffs into the lungs every 4 (four) hours as needed for wheezing or shortness of breath.   albuterol 0.63 MG/3ML nebulizer solution Commonly known as:  ACCUNEB Take 3 mLs by nebulization every 8 (eight) hours as needed for wheezing.   atorvastatin 80 MG tablet Commonly known as:  LIPITOR Take 80 mg by mouth every evening.   cefdinir 300 MG capsule Commonly known as:  OMNICEF Take 1 capsule (300 mg total) by mouth 2 (two) times  daily.   cetirizine 10 MG tablet Commonly known as:  ZYRTEC Take 5 mg by mouth at bedtime.   cholecalciferol 1000 units tablet Commonly known as:  VITAMIN D Take 1,000 Units by mouth daily.   clopidogrel 75 MG tablet Commonly known as:  PLAVIX Take 75 mg by mouth daily.   diclofenac sodium 1 % Gel Commonly known as:   VOLTAREN Apply 2 g topically every 12 (twelve) hours as needed (for left knee pain).   diltiazem 120 MG 12 hr capsule Commonly known as:  CARDIZEM SR Take 120 mg by mouth 2 (two) times daily.   docusate sodium 100 MG capsule Commonly known as:  COLACE Take 100 mg by mouth daily.   FLUoxetine 40 MG capsule Commonly known as:  PROZAC Take 40 mg by mouth daily.   Fluticasone-Umeclidin-Vilant 100-62.5-25 MCG/INH Aepb Inhale 1 puff into the lungs daily.   furosemide 20 MG tablet Commonly known as:  LASIX Take 1 tablet (20 mg total) by mouth daily.   gabapentin 300 MG capsule Commonly known as:  NEURONTIN Take 300 mg by mouth at bedtime.   hydrALAZINE 25 MG tablet Commonly known as:  APRESOLINE Take 25 mg by mouth 3 (three) times daily.   ipratropium 0.03 % nasal spray Commonly known as:  ATROVENT Place 2 sprays into both nostrils 2 (two) times daily.   ipratropium-albuterol 0.5-2.5 (3) MG/3ML Soln Commonly known as:  DUONEB Take 3 mLs by nebulization 2 (two) times daily.   levETIRAcetam 500 MG 24 hr tablet Commonly known as:  KEPPRA XR Take 1 tablet (500 mg total) by mouth daily.   linagliptin 5 MG Tabs tablet Commonly known as:  TRADJENTA Take 5 mg by mouth daily.   LORazepam 2 MG/ML injection Commonly known as:  ATIVAN Inject 0.5 mLs (1 mg total) into the muscle as needed for seizure.   meclizine 25 MG tablet Commonly known as:  ANTIVERT Take 25 mg by mouth every 12 (twelve) hours as needed for dizziness.   multivitamin with minerals Tabs tablet Take 1 tablet by mouth daily.   nystatin 100000 UNIT/ML suspension Commonly known as:  MYCOSTATIN Use as directed 5 mLs (500,000 Units total) in the mouth or throat 4 (four) times daily.   oseltamivir 30 MG capsule Commonly known as:  TAMIFLU Take 1 capsule (30 mg total) by mouth 2 (two) times daily.   pantoprazole 40 MG tablet Commonly known as:  PROTONIX Take 40 mg by mouth daily.   predniSONE 50 MG  tablet Commonly known as:  DELTASONE 1 p.o. daily        DISCHARGE INSTRUCTIONS:  If you experience worsening of your admission symptoms, develop shortness of breath, life threatening emergency, suicidal or homicidal thoughts you must seek medical attention immediately by calling 911 or calling your MD immediately  if symptoms less severe.  You Must read complete instructions/literature along with all the possible adverse reactions/side effects for all the Medicines you take and that have been prescribed to you. Take any new Medicines after you have completely understood and accept all the possible adverse reactions/side effects.   Please note  You were cared for by a hospitalist during your hospital stay. If you have any questions about your discharge medications or the care you received while you were in the hospital after you are discharged, you can call the unit and asked to speak with the hospitalist on call if the hospitalist that took care of you is not available. Once you are discharged, your  primary care physician will handle any further medical issues. Please note that NO REFILLS for any discharge medications will be authorized once you are discharged, as it is imperative that you return to your primary care physician (or establish a relationship with a primary care physician if you do not have one) for your aftercare needs so that they can reassess your need for medications and monitor your lab values.    Today   CHIEF COMPLAINT:   Chief Complaint  Patient presents with  . Shortness of Breath    HISTORY OF PRESENT ILLNESS:   78 y.o. female sent in for weakness and shortness of breath.  The patient does have COPD on chronic oxygen.  She complains of shortness of breath cough with some yellow sputum.  Occasional wheeze.  She has had some fever.  The patient is not the best historian and does answer some yes or no questions.  The patient lives in [facility].  In the  ER, she was found to have influenza A positive and a urinary tract infection and hospitalist services were contacted for further evaluation. VITAL SIGNS:  Blood pressure 119/67, pulse 84, temperature 98.4 F (36.9 C), temperature source Oral, resp. rate 16, height 5\' 6"  (1.676 m), weight 118.5 kg, SpO2 95 %.  I/O:    Intake/Output Summary (Last 24 hours) at 04/15/2018 1009 Last data filed at 04/15/2018 0415 Gross per 24 hour  Intake 99.74 ml  Output 850 ml  Net -750.26 ml    PHYSICAL EXAMINATION:  GENERAL:  78 y.o.-year-old patient lying in the bed with no acute distress.  EYES: Pupils equal, round, reactive to light and accommodation. No scleral icterus. Extraocular muscles intact.  HEENT: Head atraumatic, normocephalic. Oropharynx and nasopharynx clear.  NECK:  Supple, no jugular venous distention. No thyroid enlargement, no tenderness.  LUNGS: Normal breath sounds bilaterally, no wheezing, rales,rhonchi or crepitation. No use of accessory muscles of respiration.  CARDIOVASCULAR: S1, S2 normal. No murmurs, rubs, or gallops.  ABDOMEN: Soft, non-tender, non-distended. Bowel sounds present. No organomegaly or mass.  EXTREMITIES: No pedal edema, cyanosis, or clubbing.  NEUROLOGIC: Cranial nerves II through XII are intact. Muscle strength 5/5 in all extremities. Sensation intact. Gait not checked.  PSYCHIATRIC: The patient is alert and oriented x 3.  SKIN: No obvious rash, lesion, or ulcer.   DATA REVIEW:   CBC Recent Labs  Lab 04/13/18 1317  WBC 4.0  HGB 9.1*  HCT 29.2*  PLT 213    Chemistries  Recent Labs  Lab 04/13/18 1317 04/15/18 0358  NA 137 141  K 4.2 3.7  CL 100 103  CO2 28 29  GLUCOSE 109* 97  BUN 31* 26*  CREATININE 1.96* 1.85*  CALCIUM 8.8* 8.8*  AST 29  --   ALT 19  --   ALKPHOS 71  --   BILITOT 0.9  --     Cardiac Enzymes Recent Labs  Lab 04/13/18 1317  TROPONINI <0.03    Microbiology Results  Results for orders placed or performed during the  hospital encounter of 04/13/18  Urine Culture     Status: None (Preliminary result)   Collection Time: 04/13/18  2:33 PM  Result Value Ref Range Status   Specimen Description   Final    URINE, CATHETERIZED Performed at [facility], [address], [place], [place] [address]    Special Requests   Final    Normal Performed at [facility], [address], [place],  [address]  Culture   Final    CULTURE REINCUBATED FOR BETTER GROWTH Performed at [facility], [place] [address], [place], [place] [address]    Report Status PENDING  Incomplete  MRSA PCR Screening     Status: Abnormal   Collection Time: 04/14/18  6:45 AM  Result Value Ref Range Status   MRSA by PCR POSITIVE (A) NEGATIVE Final    Comment:        The GeneXpert MRSA Assay (FDA approved for NASAL specimens only), is one component of a comprehensive MRSA colonization surveillance program. It is not intended to diagnose MRSA infection nor to guide or monitor treatment for MRSA infections. RESULT CALLED TO, READ BACK BY AND VERIFIED WITH: DONNA HAISLIP IN 04/14/2018 AT 0840 QSD Performed at [facility], [address] [place], [place] [address]     RADIOLOGY:  Dg Chest Port 1 View  Result Date: 04/13/2018 CLINICAL DATA:  Cough and shortness of breath. EXAM: PORTABLE CHEST 1 VIEW COMPARISON:  02/24/2018 and prior radiographs FINDINGS: Cardiomegaly again noted. Mild chronic peribronchial thickening is unchanged. There is no evidence of focal airspace disease, pulmonary edema, suspicious pulmonary nodule/mass, pleural effusion, or pneumothorax. No acute bony abnormalities are identified. IMPRESSION: Cardiomegaly without evidence of acute cardiopulmonary disease. Electronically Signed   By: Margarette Canada M.D.   On: 04/13/2018 14:52    EKG:   Orders placed or performed during the hospital encounter of 04/13/18  . EKG 12-Lead  . EKG 12-Lead  . ED EKG  . ED EKG      Management  plans discussed with the patient, family and they are in agreement.  CODE STATUS:     Code Status Orders  (From admission, onward)         Start     Ordered   04/14/18 0000  Full code  Continuous     04/13/18 2359        Code Status History    Date Active Date Inactive Code Status Order ID Comments User Context   02/24/2018 2221 02/28/2018 1815 Full Code [id number]  Dustin Flock, MD Inpatient   12/25/2017 1653 12/26/2017 2152 Full Code [id number]  Gorden Harms, MD Inpatient   11/29/2017 2119 12/05/2017 1841 Full Code [id number]  Gorden Harms, MD Inpatient   11/22/2017 0408 11/26/2017 1804 Full Code [id number]  Arta Silence, MD Inpatient   11/10/2017 1840 11/13/2017 1653 Full Code [id number]  Sela Hua, MD Inpatient   10/24/2017 0247 10/29/2017 2301 Full Code [id number]  Lance Coon, MD Inpatient   10/04/2017 1005 10/10/2017 1813 Full Code [id number]  Marliss Coots, PA-C Inpatient   10/01/2017 0819 10/04/2017 0904 Full Code [id number]  Lolita Cram, RN Inpatient   09/19/2017 1306 09/22/2017 1622 Full Code [id number]  Gorden Harms, MD Inpatient   09/11/2017 2021 09/15/2017 1510 Full Code [id number]  Gorden Harms, MD Inpatient   08/27/2017 1543 08/29/2017 2159 Full Code [id number]  Hillary Bow, MD ED   06/28/2017 1414 06/30/2017 1748 Full Code [id number]  Gorden Harms, MD Inpatient   06/12/2017 0858 06/16/2017 1659 DNR [id number]  Demetrios Loll, MD Inpatient   05/26/2017 1732 05/30/2017 1446 DNR [id number]  Hillary Bow, MD ED   05/17/2017 1228 05/21/2017 1511 DNR [id number]  Asencion Gowda, [place] Inpatient   04/21/2017 0117 04/22/2017 1750 DNR [id number]  Lance Coon, MD Inpatient   04/05/2017 1411 04/08/2017 2101 Full Code [id number]  Saundra Shelling, MD Inpatient   02/08/2016 1414 02/11/2016 1729 Full Code [id number]  Hessie Knows, MD Inpatient   11/27/2015 1645 11/29/2015 1628 Full Code [id number]  Idelle Crouch, MD Inpatient   02/10/2015 2122 02/12/2015 1809 Full Code [id number]   Lytle Butte, MD ED   09/16/2014 1220 09/19/2014 1736 Full Code [id number]  Aldean Jewett, MD Inpatient      TOTAL TIME TAKING CARE OF THIS PATIENT: 40 minutes.    Avel Peace Zabdiel Dripps M.D on 04/15/2018 at 10:09 AM  Between 7am to 6pm - Pager - [phone]  After 6pm go to [email or web address] - password [email or web address] [facility]  Office  [phone]  CC: Primary care physician; Valerie Roys, DO   Note: This dictation was prepared with Dragon dictation along with smaller phrase technology. Any transcriptional errors that result from this process are unintentional.

## 2018-04-15 NOTE — Plan of Care (Signed)

## 2018-04-15 NOTE — Clinical Social Work Note (Signed)
Patient is medically ready for discharge planning. CSW notified patient at bedside of discharge today back to [facility]. CSW also notified Claiborne Billings at [facility] of discharge today. Patient will be transported by EMS. RN to call report and call for transport.   [place], [place]

## 2018-04-16 LAB — URINE CULTURE
Culture: >=100000 — AB
Special Requests: NORMAL

## 2018-04-19 DIAGNOSIS — E1322 Other specified diabetes mellitus with diabetic chronic kidney disease: Secondary | ICD-10-CM | POA: Diagnosis not present

## 2018-04-19 DIAGNOSIS — G40909 Epilepsy, unspecified, not intractable, without status epilepticus: Secondary | ICD-10-CM | POA: Diagnosis not present

## 2018-04-19 DIAGNOSIS — I129 Hypertensive chronic kidney disease with stage 1 through stage 4 chronic kidney disease, or unspecified chronic kidney disease: Secondary | ICD-10-CM | POA: Diagnosis not present

## 2018-04-19 DIAGNOSIS — J449 Chronic obstructive pulmonary disease, unspecified: Secondary | ICD-10-CM | POA: Diagnosis not present

## 2018-04-22 ENCOUNTER — Other Ambulatory Visit: Payer: Self-pay | Admitting: *Deleted

## 2018-04-23 DIAGNOSIS — D509 Iron deficiency anemia, unspecified: Secondary | ICD-10-CM | POA: Diagnosis not present

## 2018-04-23 DIAGNOSIS — R197 Diarrhea, unspecified: Secondary | ICD-10-CM | POA: Diagnosis not present

## 2018-04-23 DIAGNOSIS — I509 Heart failure, unspecified: Secondary | ICD-10-CM | POA: Diagnosis not present

## 2018-04-23 DIAGNOSIS — J9611 Chronic respiratory failure with hypoxia: Secondary | ICD-10-CM | POA: Diagnosis not present

## 2018-04-28 ENCOUNTER — Other Ambulatory Visit: Payer: Self-pay

## 2018-04-28 ENCOUNTER — Encounter: Payer: Self-pay | Admitting: Emergency Medicine

## 2018-04-28 ENCOUNTER — Observation Stay
Admission: EM | Admit: 2018-04-28 | Discharge: 2018-04-29 | Disposition: A | Discharge status: Skilled Nursing Facility | Payer: Medicare HMO | Attending: Internal Medicine | Admitting: Internal Medicine

## 2018-04-28 DIAGNOSIS — K920 Hematemesis: Principal | ICD-10-CM | POA: Insufficient documentation

## 2018-04-28 DIAGNOSIS — Z79899 Other long term (current) drug therapy: Secondary | ICD-10-CM | POA: Diagnosis not present

## 2018-04-28 DIAGNOSIS — J441 Chronic obstructive pulmonary disease with (acute) exacerbation: Secondary | ICD-10-CM | POA: Diagnosis not present

## 2018-04-28 DIAGNOSIS — Z87891 Personal history of nicotine dependence: Secondary | ICD-10-CM | POA: Diagnosis not present

## 2018-04-28 DIAGNOSIS — Z8249 Family history of ischemic heart disease and other diseases of the circulatory system: Secondary | ICD-10-CM | POA: Insufficient documentation

## 2018-04-28 DIAGNOSIS — I130 Hypertensive heart and chronic kidney disease with heart failure and stage 1 through stage 4 chronic kidney disease, or unspecified chronic kidney disease: Secondary | ICD-10-CM | POA: Diagnosis not present

## 2018-04-28 DIAGNOSIS — I5032 Chronic diastolic (congestive) heart failure: Secondary | ICD-10-CM | POA: Diagnosis not present

## 2018-04-28 DIAGNOSIS — D649 Anemia, unspecified: Secondary | ICD-10-CM | POA: Insufficient documentation

## 2018-04-28 DIAGNOSIS — F419 Anxiety disorder, unspecified: Secondary | ICD-10-CM | POA: Insufficient documentation

## 2018-04-28 DIAGNOSIS — G40909 Epilepsy, unspecified, not intractable, without status epilepticus: Secondary | ICD-10-CM | POA: Diagnosis not present

## 2018-04-28 DIAGNOSIS — J449 Chronic obstructive pulmonary disease, unspecified: Secondary | ICD-10-CM | POA: Diagnosis not present

## 2018-04-28 DIAGNOSIS — G4733 Obstructive sleep apnea (adult) (pediatric): Secondary | ICD-10-CM | POA: Insufficient documentation

## 2018-04-28 DIAGNOSIS — M1990 Unspecified osteoarthritis, unspecified site: Secondary | ICD-10-CM | POA: Insufficient documentation

## 2018-04-28 DIAGNOSIS — F329 Major depressive disorder, single episode, unspecified: Secondary | ICD-10-CM | POA: Insufficient documentation

## 2018-04-28 DIAGNOSIS — Z6841 Body Mass Index (BMI) 40.0 and over, adult: Secondary | ICD-10-CM | POA: Insufficient documentation

## 2018-04-28 DIAGNOSIS — E785 Hyperlipidemia, unspecified: Secondary | ICD-10-CM | POA: Insufficient documentation

## 2018-04-28 DIAGNOSIS — Z7902 Long term (current) use of antithrombotics/antiplatelets: Secondary | ICD-10-CM | POA: Diagnosis not present

## 2018-04-28 DIAGNOSIS — Z8673 Personal history of transient ischemic attack (TIA), and cerebral infarction without residual deficits: Secondary | ICD-10-CM | POA: Insufficient documentation

## 2018-04-28 DIAGNOSIS — R110 Nausea: Secondary | ICD-10-CM | POA: Diagnosis not present

## 2018-04-28 DIAGNOSIS — N183 Chronic kidney disease, stage 3 (moderate): Secondary | ICD-10-CM | POA: Diagnosis not present

## 2018-04-28 DIAGNOSIS — I2510 Atherosclerotic heart disease of native coronary artery without angina pectoris: Secondary | ICD-10-CM | POA: Insufficient documentation

## 2018-04-28 DIAGNOSIS — Z7951 Long term (current) use of inhaled steroids: Secondary | ICD-10-CM | POA: Insufficient documentation

## 2018-04-28 DIAGNOSIS — E559 Vitamin D deficiency, unspecified: Secondary | ICD-10-CM | POA: Insufficient documentation

## 2018-04-28 DIAGNOSIS — Z9981 Dependence on supplemental oxygen: Secondary | ICD-10-CM | POA: Diagnosis not present

## 2018-04-28 DIAGNOSIS — R1111 Vomiting without nausea: Secondary | ICD-10-CM | POA: Diagnosis not present

## 2018-04-28 DIAGNOSIS — E1122 Type 2 diabetes mellitus with diabetic chronic kidney disease: Secondary | ICD-10-CM | POA: Insufficient documentation

## 2018-04-28 DIAGNOSIS — K219 Gastro-esophageal reflux disease without esophagitis: Secondary | ICD-10-CM | POA: Insufficient documentation

## 2018-04-28 DIAGNOSIS — R0602 Shortness of breath: Secondary | ICD-10-CM | POA: Diagnosis not present

## 2018-04-28 DIAGNOSIS — R58 Hemorrhage, not elsewhere classified: Secondary | ICD-10-CM | POA: Diagnosis not present

## 2018-04-28 HISTORY — DX: Hematemesis: K92.0

## 2018-04-28 LAB — CBC
HCT: 26.1 % — ABNORMAL LOW (ref 36.0–46.0)
HCT: 26 % — ABNORMAL LOW (ref 36.0–46.0)
Hemoglobin: 8.1 g/dL — ABNORMAL LOW (ref 12.0–15.0)
Hemoglobin: 8 g/dL — ABNORMAL LOW (ref 12.0–15.0)
MCH: 27.1 pg (ref 26.0–34.0)
MCH: 26.8 pg (ref 26.0–34.0)
MCHC: 31 g/dL (ref 30.0–36.0)
MCHC: 30.8 g/dL (ref 30.0–36.0)
MCV: 87.3 fL (ref 80.0–100.0)
MCV: 87 fL (ref 80.0–100.0)
PLATELETS: 193 10*3/uL (ref 150–400)
Platelets: 188 10*3/uL (ref 150–400)
RBC: 2.99 MIL/uL — ABNORMAL LOW (ref 3.87–5.11)
RBC: 2.99 MIL/uL — ABNORMAL LOW (ref 3.87–5.11)
RDW: 17.2 % — AB (ref 11.5–15.5)
RDW: 17 % — ABNORMAL HIGH (ref 11.5–15.5)
WBC: 7.5 10*3/uL (ref 4.0–10.5)
WBC: 5.7 10*3/uL (ref 4.0–10.5)
nRBC: 0 % (ref 0.0–0.2)
nRBC: 0 % (ref 0.0–0.2)

## 2018-04-28 LAB — CBC WITH DIFFERENTIAL/PLATELET
Abs Immature Granulocytes: 0.04 10*3/uL (ref 0.00–0.07)
Basophils Absolute: 0 10*3/uL (ref 0.0–0.1)
Basophils Relative: 0 %
Eosinophils Absolute: 0.1 10*3/uL (ref 0.0–0.5)
Eosinophils Relative: 2 %
HCT: 27.5 % — ABNORMAL LOW (ref 36.0–46.0)
Hemoglobin: 8.5 g/dL — ABNORMAL LOW (ref 12.0–15.0)
Immature Granulocytes: 1 %
Lymphocytes Relative: 19 %
Lymphs Abs: 1.2 10*3/uL (ref 0.7–4.0)
MCH: 26.8 pg (ref 26.0–34.0)
MCHC: 30.9 g/dL (ref 30.0–36.0)
MCV: 86.8 fL (ref 80.0–100.0)
MONO ABS: 0.9 10*3/uL (ref 0.1–1.0)
Monocytes Relative: 15 %
NEUTROS ABS: 3.9 10*3/uL (ref 1.7–7.7)
Neutrophils Relative %: 63 %
Platelets: 198 10*3/uL (ref 150–400)
RBC: 3.17 MIL/uL — ABNORMAL LOW (ref 3.87–5.11)
RDW: 17 % — ABNORMAL HIGH (ref 11.5–15.5)
WBC: 6.2 10*3/uL (ref 4.0–10.5)
nRBC: 0 % (ref 0.0–0.2)

## 2018-04-28 LAB — COMPREHENSIVE METABOLIC PANEL
ALT: 33 U/L (ref 0–44)
AST: 34 U/L (ref 15–41)
Albumin: 2.7 g/dL — ABNORMAL LOW (ref 3.5–5.0)
Alkaline Phosphatase: 109 U/L (ref 38–126)
Anion gap: 9 (ref 5–15)
BUN: 12 mg/dL (ref 8–23)
CO2: 26 mmol/L (ref 22–32)
CREATININE: 1.39 mg/dL — AB (ref 0.44–1.00)
Calcium: 9 mg/dL (ref 8.9–10.3)
Chloride: 102 mmol/L (ref 98–111)
GFR calc non Af Amer: 37 mL/min — ABNORMAL LOW (ref >60)
GFR, EST AFRICAN AMERICAN: 43 mL/min — AB (ref >60)
Glucose, Bld: 106 mg/dL — ABNORMAL HIGH (ref 70–99)
Potassium: 4 mmol/L (ref 3.5–5.1)
Sodium: 137 mmol/L (ref 135–145)
Total Bilirubin: 0.4 mg/dL (ref 0.3–1.2)
Total Protein: 6.4 g/dL — ABNORMAL LOW (ref 6.5–8.1)

## 2018-04-28 LAB — IRON AND TIBC
Iron: 14 ug/dL — ABNORMAL LOW (ref 28–170)
Saturation Ratios: 5 % — ABNORMAL LOW (ref 10.4–31.8)
TIBC: 261 ug/dL (ref 250–450)
UIBC: 247 ug/dL

## 2018-04-28 LAB — PROTIME-INR
INR: 1 (ref 0.8–1.2)
Prothrombin Time: 12.7 seconds (ref 11.4–15.2)

## 2018-04-28 LAB — SAMPLE TO BLOOD BANK

## 2018-04-28 LAB — LIPASE, BLOOD: Lipase: 27 U/L (ref 11–51)

## 2018-04-28 MED ORDER — VITAMIN D 25 MCG (1000 UNIT) PO TABS
1000.0000 [IU] | ORAL_TABLET | Freq: Every day | ORAL | Status: DC
  Administered 2018-04-28 – 2018-04-29 (×2): 1000 [IU] via ORAL
  Filled 2018-04-28 (×2): qty 1

## 2018-04-28 MED ORDER — DILTIAZEM HCL ER 60 MG PO CP12
120.0000 mg | ORAL_CAPSULE | Freq: Two times a day (BID) | ORAL | Status: DC
  Administered 2018-04-28: 120 mg via ORAL
  Filled 2018-04-28 (×5): qty 2

## 2018-04-28 MED ORDER — DOCUSATE SODIUM 100 MG PO CAPS
100.0000 mg | ORAL_CAPSULE | Freq: Two times a day (BID) | ORAL | Status: DC
  Administered 2018-04-28 – 2018-04-29 (×2): 100 mg via ORAL
  Filled 2018-04-28 (×2): qty 1

## 2018-04-28 MED ORDER — ATORVASTATIN CALCIUM 20 MG PO TABS
80.0000 mg | ORAL_TABLET | Freq: Every evening | ORAL | Status: DC
  Administered 2018-04-28 – 2018-04-29 (×2): 80 mg via ORAL
  Filled 2018-04-28 (×2): qty 4

## 2018-04-28 MED ORDER — LEVETIRACETAM ER 500 MG PO TB24
500.0000 mg | ORAL_TABLET | Freq: Every day | ORAL | Status: DC
  Administered 2018-04-28 – 2018-04-29 (×2): 500 mg via ORAL
  Filled 2018-04-28 (×3): qty 1

## 2018-04-28 MED ORDER — SODIUM CHLORIDE 0.9 % IV SOLN
8.0000 mg/h | INTRAVENOUS | Status: DC
  Administered 2018-04-28 – 2018-04-29 (×3): 8 mg/h via INTRAVENOUS
  Filled 2018-04-28 (×2): qty 80

## 2018-04-28 MED ORDER — LORAZEPAM 2 MG/ML IJ SOLN: 1.0000 mg | INTRAMUSCULAR | Status: DC | PRN

## 2018-04-28 MED ORDER — LINAGLIPTIN 5 MG PO TABS
5.0000 mg | ORAL_TABLET | Freq: Every day | ORAL | Status: DC
  Filled 2018-04-28: qty 1

## 2018-04-28 MED ORDER — BISACODYL 5 MG PO TBEC: 5.0000 mg | DELAYED_RELEASE_TABLET | Freq: Every day | ORAL | Status: DC | PRN

## 2018-04-28 MED ORDER — TRAZODONE HCL 50 MG PO TABS: 25.0000 mg | ORAL_TABLET | Freq: Every evening | ORAL | Status: DC | PRN

## 2018-04-28 MED ORDER — SODIUM CHLORIDE 0.9 % IV SOLN
80.0000 mg | Freq: Once | INTRAVENOUS | Status: AC
  Administered 2018-04-28: 80 mg via INTRAVENOUS
  Filled 2018-04-28: qty 80

## 2018-04-28 MED ORDER — PANTOPRAZOLE SODIUM 40 MG IV SOLR: 40.0000 mg | Freq: Two times a day (BID) | INTRAVENOUS | Status: DC

## 2018-04-28 MED ORDER — GABAPENTIN 300 MG PO CAPS
300.0000 mg | ORAL_CAPSULE | Freq: Every day | ORAL | Status: DC
  Administered 2018-04-28: 300 mg via ORAL
  Filled 2018-04-28: qty 1

## 2018-04-28 MED ORDER — ONDANSETRON HCL 4 MG PO TABS: 4.0000 mg | ORAL_TABLET | Freq: Four times a day (QID) | ORAL | Status: DC | PRN

## 2018-04-28 MED ORDER — ACETAMINOPHEN 650 MG RE SUPP: 650.0000 mg | Freq: Four times a day (QID) | RECTAL | Status: DC | PRN

## 2018-04-28 MED ORDER — LACTATED RINGERS IV BOLUS
1000.0000 mL | Freq: Once | INTRAVENOUS | Status: AC
  Administered 2018-04-28: 1000 mL via INTRAVENOUS

## 2018-04-28 MED ORDER — HYDRALAZINE HCL 25 MG PO TABS
25.0000 mg | ORAL_TABLET | Freq: Three times a day (TID) | ORAL | Status: DC
  Administered 2018-04-28: 25 mg via ORAL
  Filled 2018-04-28 (×3): qty 1

## 2018-04-28 MED ORDER — ACETAMINOPHEN 325 MG PO TABS
650.0000 mg | ORAL_TABLET | Freq: Four times a day (QID) | ORAL | Status: DC | PRN
  Administered 2018-04-29: 650 mg via ORAL
  Filled 2018-04-28: qty 2

## 2018-04-28 MED ORDER — SODIUM CHLORIDE 0.9 % IV SOLN
INTRAVENOUS | Status: DC
  Administered 2018-04-28: 13:00:00 via INTRAVENOUS

## 2018-04-28 MED ORDER — ACETAMINOPHEN 325 MG PO TABS
650.0000 mg | ORAL_TABLET | Freq: Three times a day (TID) | ORAL | Status: DC
  Administered 2018-04-28 – 2018-04-29 (×4): 650 mg via ORAL
  Filled 2018-04-28 (×4): qty 2

## 2018-04-28 MED ORDER — ONDANSETRON HCL 4 MG/2ML IJ SOLN: 4.0000 mg | Freq: Four times a day (QID) | INTRAMUSCULAR | Status: DC | PRN

## 2018-04-28 MED ORDER — ACETAMINOPHEN 500 MG PO TABS
1000.0000 mg | ORAL_TABLET | Freq: Once | ORAL | Status: AC
  Administered 2018-04-28: 1000 mg via ORAL
  Filled 2018-04-28: qty 2

## 2018-04-28 MED ORDER — PANTOPRAZOLE SODIUM 40 MG PO TBEC: 40.0000 mg | DELAYED_RELEASE_TABLET | Freq: Every day | ORAL | Status: DC

## 2018-04-28 MED ORDER — PANTOPRAZOLE SODIUM 40 MG IV SOLR
40.0000 mg | Freq: Once | INTRAVENOUS | Status: AC
  Administered 2018-04-28: 40 mg via INTRAVENOUS
  Filled 2018-04-28: qty 40

## 2018-04-28 MED ORDER — SODIUM CHLORIDE 0.9 % IV SOLN
8.0000 mg/h | INTRAVENOUS | Status: DC
  Filled 2018-04-28: qty 80

## 2018-04-28 MED ORDER — FLUOXETINE HCL 20 MG PO CAPS
40.0000 mg | ORAL_CAPSULE | Freq: Every day | ORAL | Status: DC
  Administered 2018-04-28 – 2018-04-29 (×2): 40 mg via ORAL
  Filled 2018-04-28 (×2): qty 2

## 2018-04-28 MED ORDER — HYDROCODONE-ACETAMINOPHEN 5-325 MG PO TABS: 1.0000 | ORAL_TABLET | ORAL | Status: DC | PRN

## 2018-04-28 MED ORDER — MECLIZINE HCL 25 MG PO TABS
25.0000 mg | ORAL_TABLET | Freq: Two times a day (BID) | ORAL | Status: DC | PRN
  Filled 2018-04-28: qty 1

## 2018-04-28 MED ORDER — ALBUTEROL SULFATE (2.5 MG/3ML) 0.083% IN NEBU: 2.5000 mg | INHALATION_SOLUTION | Freq: Three times a day (TID) | RESPIRATORY_TRACT | Status: DC | PRN

## 2018-04-28 MED ORDER — SODIUM CHLORIDE 0.9 % IV SOLN
INTRAVENOUS | Status: DC | PRN
  Administered 2018-04-29: 20 mL via INTRAVENOUS

## 2018-04-28 NOTE — ED Notes (Signed)
ED TO INPATIENT HANDOFF REPORT  ED Nurse Name and Phone #:  [phone]  S Name/Age/Gender Katherine [name] 78 y.o. female Room/Bed: ED19A/ED19A  Code Status   Code Status: Full Code  Home/SNF/Other Nursing Home Patient oriented to: self, place, time and situation Is this baseline? Yes   Triage Complete: Triage complete  Chief Complaint EMS  Triage Note Pt to ED via [facility] from [facility] for hematemesis. Per EMS staff reports that they suctioned out 100 cc of bright red blood with clots. Pt is on blood thinners. Pt was diagnosed with flu last week. Pt denies fevers or shortness of breath. Pt on chronic O2 at 2 liter via [place]. EMS reports that pts systolic blood pressure dropped from 113 to 93, NS bolus was started in route. Pt is currently in NAD.     Allergies Allergies  Allergen Reactions  . Bee Venom Swelling  . Enalapril Maleate Swelling    Level of Care/Admitting Diagnosis ED Disposition    ED Disposition Condition [facility] Area: [place] [[id number]]  Level of Care: Med-Surg [16]  Diagnosis: Hematemesis [578.0.ICD-9-CM]  Admitting Physician: Epifanio Lesches [[id number]]  Attending Physician: Epifanio Lesches [phone]  PT Class (Do Not Modify): Observation [104]  PT Acc Code (Do Not Modify): Observation [10022]       B Medical/Surgery History Past Medical History:  Diagnosis Date  . Angioedema   . Anxiety   . Anxiety and depression   . CHF (congestive heart failure) ([place])   . CHF (congestive heart failure) ([place])   . Chronic constipation   . Chronic kidney disease    stage 3  . COPD (chronic obstructive pulmonary disease) ([place])   . Diabetes mellitus without complication ([place])   . Gallstones   . GERD (gastroesophageal reflux disease)   . Hypertension   . Left ventricular hypertrophy   . Osteoarthritis   . Prurigo nodularis   . Seizures ([place])   . Stroke ([place])   . Tobacco abuse   . Vitamin D deficiency  disease    Past Surgical History:  Procedure Laterality Date  . CHOLECYSTECTOMY    . 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   Procedure: TOTAL KNEE ARTHROPLASTY;  Surgeon: Hessie Knows, MD;  Location: [facility] ORS;  Service: Orthopedics;  Laterality: Right;     A IV Location/Drains/Wounds Patient Lines/Drains/Airways Status   Active Line/Drains/Airways    Name:   Placement date:   Placement time:   Site:   Days:   Peripheral IV 04/28/18 Left Antecubital   04/28/18    0844    Antecubital   less than 1   Peripheral IV 04/28/18 Right Forearm   04/28/18    0845    Forearm   less than 1          Intake/Output Last 24 hours No intake or output data in the 24 hours ending 04/28/18 1104  Labs/Imaging Results for orders placed or performed during the hospital encounter of 04/28/18 (from the past 48 hour(s))  CBC with Differential     Status: Abnormal   Collection Time: 04/28/18  8:40 AM  Result Value Ref Range   WBC 6.2 4.0 - 10.5 K/uL   RBC 3.17 (L) 3.87 - 5.11 MIL/uL   Hemoglobin 8.5 (L) 12.0 - 15.0 g/dL   HCT 27.5 (L) 36.0 - 46.0 %   MCV 86.8 80.0 - 100.0 fL   MCH 26.8 26.0 -  34.0 pg   MCHC 30.9 30.0 - 36.0 g/dL   RDW 17.0 (H) 11.5 - 15.5 %   Platelets 198 150 - 400 K/uL   nRBC 0.0 0.0 - 0.2 %   Neutrophils Relative % 63 %   Neutro Abs 3.9 1.7 - 7.7 K/uL   Lymphocytes Relative 19 %   Lymphs Abs 1.2 0.7 - 4.0 K/uL   Monocytes Relative 15 %   Monocytes Absolute 0.9 0.1 - 1.0 K/uL   Eosinophils Relative 2 %   Eosinophils Absolute 0.1 0.0 - 0.5 K/uL   Basophils Relative 0 %   Basophils Absolute 0.0 0.0 - 0.1 K/uL   Immature Granulocytes 1 %   Abs Immature Granulocytes 0.04 0.00 - 0.07 K/uL    Comment: Performed at [facility], [address] [place], [place] [address]  Comprehensive metabolic panel     Status: Abnormal   Collection Time: 04/28/18  8:40 AM  Result Value Ref Range   Sodium 137 135 - 145 mmol/L   Potassium  4.0 3.5 - 5.1 mmol/L   Chloride 102 98 - 111 mmol/L   CO2 26 22 - 32 mmol/L   Glucose, Bld 106 (H) 70 - 99 mg/dL   BUN 12 8 - 23 mg/dL   Creatinine, Ser 1.39 (H) 0.44 - 1.00 mg/dL   Calcium 9.0 8.9 - 10.3 mg/dL   Total Protein 6.4 (L) 6.5 - 8.1 g/dL   Albumin 2.7 (L) 3.5 - 5.0 g/dL   AST 34 15 - 41 U/L   ALT 33 0 - 44 U/L   Alkaline Phosphatase 109 38 - 126 U/L   Total Bilirubin 0.4 0.3 - 1.2 mg/dL   GFR calc non Af Amer 37 (L) >60 mL/min   GFR calc Af Amer 43 (L) >60 mL/min   Anion gap 9 5 - 15    Comment: Performed at [facility], [address] [place], [place] [address]  Lipase, blood     Status: None   Collection Time: 04/28/18  8:40 AM  Result Value Ref Range   Lipase 27 11 - 51 U/L    Comment: Performed at [facility], [address] [place], [place] [address]  Protime-INR     Status: None   Collection Time: 04/28/18  8:40 AM  Result Value Ref Range   Prothrombin Time 12.7 11.4 - 15.2 seconds   INR 1.0 0.8 - 1.2    Comment: (NOTE) INR goal varies based on device and disease states. Performed at [facility], [address] [place], [place] [address]   Sample to Blood Bank     Status: None   Collection Time: 04/28/18  8:40 AM  Result Value Ref Range   Blood Bank Specimen SAMPLE AVAILABLE FOR TESTING    Sample Expiration      05/01/2018 Performed at [facility], [address] [place],  [address]    No results found.  Pending Labs Unresulted Labs (From admission, onward)    Start     Ordered   04/29/18 2836  Basic metabolic panel  Tomorrow morning,   STAT     04/28/18 1053   04/29/18 0500  CBC  Tomorrow morning,   STAT     04/28/18 1053   04/28/18 1300  CBC  3 times daily,   STAT     04/28/18 1053          Vitals/Pain Today's Vitals   04/28/18 0900 04/28/18 0930 04/28/18 1000 04/28/18  1030  BP: 126/79 115/65 127/77 122/79  Pulse: 76 76 99 78  Resp: (!) 22 (!) 23 (!) 24 15  Temp:      TempSrc:       SpO2: 96% 96% 96% 99%  Weight:      PainSc:        Isolation Precautions No active isolations  Medications Medications  acetaminophen (TYLENOL) tablet 650 mg (has no administration in time range)  atorvastatin (LIPITOR) tablet 80 mg (has no administration in time range)  diltiazem (CARDIZEM SR) 12 hr capsule 120 mg (has no administration in time range)  hydrALAZINE (APRESOLINE) tablet 25 mg (has no administration in time range)  FLUoxetine (PROZAC) capsule 40 mg (has no administration in time range)  LORazepam (ATIVAN) injection 1 mg (has no administration in time range)  linagliptin (TRADJENTA) tablet 5 mg (has no administration in time range)  meclizine (ANTIVERT) tablet 25 mg (has no administration in time range)  pantoprazole (PROTONIX) EC tablet 40 mg (has no administration in time range)  levETIRAcetam (KEPPRA XR) 24 hr tablet 500 mg (has no administration in time range)  gabapentin (NEURONTIN) capsule 300 mg (has no administration in time range)  cholecalciferol (VITAMIN D) tablet 1,000 Units (has no administration in time range)  albuterol (ACCUNEB) nebulizer solution 3 mL (has no administration in time range)  0.9 %  sodium chloride infusion (has no administration in time range)  acetaminophen (TYLENOL) tablet 650 mg (has no administration in time range)    Or  acetaminophen (TYLENOL) suppository 650 mg (has no administration in time range)  HYDROcodone-acetaminophen (NORCO/VICODIN) 5-325 MG per tablet 1-2 tablet (has no administration in time range)  traZODone (DESYREL) tablet 25 mg (has no administration in time range)  docusate sodium (COLACE) capsule 100 mg (has no administration in time range)  bisacodyl (DULCOLAX) EC tablet 5 mg (has no administration in time range)  ondansetron (ZOFRAN) tablet 4 mg (has no administration in time range)    Or  ondansetron (ZOFRAN) injection 4 mg (has no administration in time range)  pantoprazole (PROTONIX) 80 mg in sodium chloride  0.9 % 100 mL IVPB (has no administration in time range)  pantoprazole (PROTONIX) 80 mg in sodium chloride 0.9 % 250 mL (0.32 mg/mL) infusion (has no administration in time range)  pantoprazole (PROTONIX) injection 40 mg (has no administration in time range)  pantoprazole (PROTONIX) injection 40 mg (40 mg Intravenous Given 04/28/18 0854)  acetaminophen (TYLENOL) tablet 1,000 mg (1,000 mg Oral Given 04/28/18 1048)    Mobility manual wheelchair High fall risk   Focused Assessments Pulmonary Assessment Handoff:  Lung sounds:   O2 Device: Nasal Cannula O2 Flow Rate (L/min): 2 L/min      R Recommendations: See Admitting Provider Note  Report given to:   Additional Notes:

## 2018-04-28 NOTE — ED Triage Notes (Signed)
Pt to ED via [facility] from [facility] for hematemesis. Per EMS staff reports that they suctioned out 100 cc of bright red blood with clots. Pt is on blood thinners. Pt was diagnosed with flu last week. Pt denies fevers or shortness of breath. Pt on chronic O2 at 2 liter via [place]. EMS reports that pts systolic blood pressure dropped from 113 to 93, NS bolus was started in route. Pt is currently in NAD.

## 2018-04-28 NOTE — Progress Notes (Signed)
   04/28/18 1445  Clinical Encounter Type  Visited With Patient  Visit Type Initial  Referral From Nurse  Consult/Referral To Chaplain  Spiritual Encounters  Spiritual Needs Prayer;Emotional;Grief support  CH received Order Requisition to visit patient. Room was droplet precaution. After applying PPE, D'Hanis entered room. Patient seemed anxious. Verbal. Head of bed elevated to almost 90 degrees. Patient exhibited labored breathing. Seemed uncomfortable. Patient indicated she was cold. Keller retrieved blanket for patient. Took off and put on PPE upon re-entering room. Established rapport with patient. Patient distressed because grandson was killed and will be buried tomorrow. Provided grief support through active/reflective listening. Validated emotions and feelings. Prayed with patient upon request. Patient thanked [facility] for pastoral visit. Follow up visits are welcomed.

## 2018-04-28 NOTE — ED Provider Notes (Signed)
[facility] Emergency Department Provider Note       Time seen: ----------------------------------------- 8:34 AM on 04/28/2018 -----------------------------------------   I have reviewed the triage vital signs and the nursing notes.  HISTORY   Chief Complaint Hematemesis    HPI Katherine [name] is a 78 y.o. female with a history of angioedema, anxiety, CHF, chronic kidney disease, COPD, GERD, osteoarthritis who presents to the ED for GI bleeding.  Patient is brought from a nursing home after she was vomiting bright red blood this morning with clots.  Patient states this is happened before.  She does have some difficulty breathing, is on chronic nasal cannula oxygen.  She denies any other complaints at this time.  Past Medical History:  Diagnosis Date  . Angioedema   . Anxiety   . Anxiety and depression   . CHF (congestive heart failure) ([place])   . CHF (congestive heart failure) ([place])   . Chronic constipation   . Chronic kidney disease    stage 3  . COPD (chronic obstructive pulmonary disease) ([place])   . Diabetes mellitus without complication ([place])   . Gallstones   . GERD (gastroesophageal reflux disease)   . Hypertension   . Left ventricular hypertrophy   . Osteoarthritis   . Prurigo nodularis   . Seizures ([place])   . Stroke ([place])   . Tobacco abuse   . Vitamin D deficiency disease     Patient Active Problem List   Diagnosis Date Noted  . Influenza A 04/13/2018  . Acute on chronic congestive heart failure ([place])   . COPD with acute exacerbation ([place]) 02/24/2018  . Chronic diastolic heart failure ([place]) 12/31/2017  . Lymphedema 12/31/2017  . COPD exacerbation ([place]) 11/30/2017  . Urinary tract infection 11/22/2017  . 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[place]) 11/16/2017  . Diabetes mellitus type 2, uncomplicated ([place]) 16/08/3708  . Pressure injury of skin 10/29/2017  . Acute kidney injury superimposed on CKD ([place]) 10/24/2017  . Severe sepsis  ([place]) 10/24/2017  . Possible Seizures ([place]) 10/08/2017  . Hyperlipemia 10/08/2017  . 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 Stroke-like episode ([place]) s/p IV tpa 10/04/2017  . Palliative care by specialist   . Elevated rheumatoid factor 09/05/2017  . Frequent hospital admissions 08/22/2017  . Aphasia 06/28/2017  . Hand pain 03/21/2017  . Dry skin 03/21/2017  . Pain in finger of left hand 09/12/2016  . Chronic fatigue 06/12/2016  . Left knee pain 06/12/2016  . Goals of care, counseling/discussion 03/13/2016  . Primary localized osteoarthritis of right knee 02/08/2016  . OSA (obstructive sleep apnea) 09/16/2015  . Rotator cuff syndrome 09/07/2015  . 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 Sleep disturbance 04/14/2015  . Coronary artery disease 03/14/2015  . 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 Acute respiratory failure with hypoxia ([place]) 09/16/2014  . 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 Anxiety   . Tobacco abuse   . Prurigo nodularis   . GERD (gastroesophageal reflux disease)   . COPD (chronic obstructive pulmonary disease) ([place])   . Osteoarthritis   . Vitamin D deficiency disease   . Chronic constipation   . CKD (chronic kidney disease), stage III ([place])   . Essential hypertension 05/20/2013  . Morbid obesity ([place]) 05/20/2013    Past Surgical History:  Procedure Laterality Date  . CHOLECYSTECTOMY    . 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   Procedure: TOTAL KNEE ARTHROPLASTY;  Surgeon: Hessie Knows, MD;  Location: [facility] ORS;  Service: Orthopedics;  Laterality: Right;    Allergies Bee venom and Enalapril maleate  Social History Social History   Tobacco Use  . Smoking status: Former Smoker    Packs/day: 0.50    Years: 60.00    Pack years: 30.00    Types: Cigarettes  . Smokeless tobacco: Never Used  . Tobacco comment: quite 63mo ago-03/26/18  Substance Use Topics  . Alcohol use: No    Alcohol/week: 0.0 standard  drinks    Comment: rare  . Drug use: No   Review of Systems Constitutional: Negative for fever. Cardiovascular: Negative for chest pain. Respiratory: Positive for shortness of breath Gastrointestinal: Positive for hematemesis Musculoskeletal: Negative for back pain. Skin: Negative for rash. Neurological: Negative for headaches, focal weakness or numbness.  All systems negative/normal/unremarkable except as stated in the HPI  ____________________________________________   PHYSICAL EXAM:  VITAL SIGNS: ED Triage Vitals  Enc Vitals Group     BP      Pulse      Resp      Temp      Temp src      SpO2      Weight      Height      Head Circumference      Peak Flow      Pain Score      Pain Loc      Pain Edu?      Excl. in [place]?    Constitutional: Alert, no acute distress Eyes: Conjunctivae are normal. Normal extraocular movements. ENT      Head: Normocephalic and atraumatic.      Nose: No congestion/rhinnorhea.      Mouth/Throat: Mucous membranes are moist.  No obvious blood in the oropharynx at this time      Neck: No stridor. Cardiovascular: Normal rate, regular rhythm. No murmurs, rubs, or gallops. Respiratory: Normal respiratory effort without tachypnea nor retractions. Breath sounds are clear and equal bilaterally. No wheezes/rales/rhonchi. Gastrointestinal: Soft and nontender. Normal bowel sounds Musculoskeletal: Nontender with normal range of motion in extremities.  Mild edema Neurologic:  Normal speech and language. No gross focal neurologic deficits are appreciated.  Skin:  Skin is warm, dry and intact. No rash noted. Psychiatric: Mood and affect are normal.  ____________________________________________  ED COURSE:  As part of my medical decision making, I reviewed the following data within the [place] History obtained from family if available, nursing notes, old chart and ekg, as well as notes from prior ED visits. Patient presented for  gastrointestinal bleeding, we will assess with labs and imaging as indicated at this time.   Procedures ____________________________________________   LABS (pertinent positives/negatives)  Labs Reviewed  CBC WITH DIFFERENTIAL/PLATELET - Abnormal; Notable for the following components:      Result Value   RBC 3.17 (*)    Hemoglobin 8.5 (*)    HCT 27.5 (*)    RDW 17.0 (*)    All other components within normal limits  COMPREHENSIVE METABOLIC PANEL - Abnormal; Notable for the following components:   Glucose, Bld 106 (*)    Creatinine, Ser 1.39 (*)    Total Protein 6.4 (*)    Albumin 2.7 (*)    GFR calc non Af Amer 37 (*)    GFR calc Af Amer 43 (*)    All other components within normal limits  LIPASE, BLOOD  PROTIME-INR  SAMPLE TO BLOOD BANK  ____________________________________________   DIFFERENTIAL DIAGNOSIS   Hematemesis, anemia, peptic  ulcer disease, esophageal varices, medication side effect  FINAL ASSESSMENT AND PLAN  Hematemesis   Plan: The patient had presented for hematemesis. Patient's labs did reveal anemia which is slightly worse from prior.  It was reported from [place] that she had a significant bleed prior to arrival.  It appears to have resolved at this time, we gave IV Protonix.  I will discuss with hospitalist for admission.   Laurence Aly, MD    Note: This note was generated in part or whole with voice recognition software. Voice recognition is usually quite accurate but there are transcription errors that can and very often do occur. I apologize for any typographical errors that were not detected and corrected.     Earleen Newport, MD 04/28/18 1038

## 2018-04-28 NOTE — Care Management Obs Status (Signed)
[facility] NOTIFICATION   Patient Details  Name: Katherine [name] MRN: [id number] Date of Birth: August 24, 1940   Medicare Observation Status Notification Given:  Yes    Frederik Standley A Tanylah Schnoebelen, RN 04/28/2018, 2:10 PM

## 2018-04-28 NOTE — Consult Note (Signed)
   Rohini R Vanga, MD [address]  [address]  [place], [place] [address]  Main: [phone]  Fax: [phone] Pager: [phone]   Consultation  Referring Provider:     No ref. provider found Primary Care Physician:  Johnson, Megan P, DO Primary Gastroenterologist: Unassigned        Reason for Consultation:     Hematemesis  Date of Admission:  04/28/2018 Date of Consultation:  04/28/2018         HPI:   Katherine [name] is a 77 y.o. female with morbid obesity, COPD, CHF, stroke on Plavix, metabolic syndrome who presented from [facility] secondary to hematemesis.  Apparently, patient threw up about 100 mL of bright red blood with clots per EMS and she had mild hypotension that responded to fluid bolus.  She did not have further episodes since admission.  Her hemoglobin on arrival was 8.5, at baseline runs around 9, normal MCV.  Apparently, she has chronic anemia for last 1 year with mild iron deficiency.  Repeat hemoglobin 8.1.  BUN/creatinine 12/1.39 which is actually improved compared to the past.  She does not have chronic liver disease or cirrhosis.  Patient is not on acid suppressive therapy as outpatient.  She denies black stools, rectal bleeding or similar episodes in the past.  Patient has been hemodynamically stable, kept n.p.o., started on pantoprazole drip and admitted to the floor.  GI is consulted for further evaluation of hematemesis. She is on contact precautions for MRSA and VRE  NSAIDs: None  Antiplts/Anticoagulants/Anti thrombotics: Plavix for history of stroke  GI Procedures: None  Past Medical History:  Diagnosis Date  . Angioedema   . Anxiety   . Anxiety and depression   . CHF (congestive heart failure) (HCC)   . CHF (congestive heart failure) (HCC)   . Chronic constipation   . Chronic kidney disease    stage 3  . COPD (chronic obstructive pulmonary disease) (HCC)   . Diabetes mellitus without complication (HCC)   . Gallstones   . GERD  (gastroesophageal reflux disease)   . Hypertension   . Left ventricular hypertrophy   . Osteoarthritis   . Prurigo nodularis   . Seizures (HCC)   . Stroke (HCC)   . Tobacco abuse   . Vitamin D deficiency disease     Past Surgical History:  Procedure Laterality Date  . CHOLECYSTECTOMY    . 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   Procedure: TOTAL KNEE ARTHROPLASTY;  Surgeon: Michael Menz, MD;  Location: [facility] ORS;  Service: Orthopedics;  Laterality: Right;    Prior to Admission medications   Medication Sig Start Date End Date Taking? Authorizing Provider  acetaminophen (TYLENOL) 650 MG CR tablet Take 650 mg by mouth 3 (three) times daily.    Yes [provider]  albuterol (ACCUNEB) 0.63 MG/3ML nebulizer solution Take 3 mLs by nebulization every 8 (eight) hours as needed for wheezing.    Yes [provider]  albuterol (PROVENTIL HFA;VENTOLIN HFA) 108 (90 Base) MCG/ACT inhaler Inhale 2 puffs into the lungs every 4 (four) hours as needed for wheezing or shortness of breath.   Yes [provider]  atorvastatin (LIPITOR) 80 MG tablet Take 80 mg by mouth every evening.    Yes [provider]  cetirizine (ZYRTEC) 10 MG tablet Take 5 mg by mouth at bedtime.   Yes [provider]  cholecalciferol (VITAMIN D) 1000 units tablet Take 1,000 Units by   mouth daily.   Yes [provider]  clopidogrel (PLAVIX) 75 MG tablet Take 75 mg by mouth daily.   Yes [provider]  diclofenac sodium (VOLTAREN) 1 % GEL Apply 2 g topically every 12 (twelve) hours as needed (for left knee pain).    Yes [provider]  diltiazem (CARDIZEM SR) 120 MG 12 hr capsule Take 120 mg by mouth 2 (two) times daily.   Yes [provider]  FLUoxetine (PROZAC) 40 MG capsule Take 40 mg by mouth daily.    Yes [provider]  Fluticasone-Umeclidin-Vilant 100-62.5-25 MCG/INH AEPB Inhale 1 puff into the  lungs daily.   Yes [provider]  furosemide (LASIX) 20 MG tablet Take 1 tablet (20 mg total) by mouth daily. 11/14/17  Yes Salary, Avel Peace, MD  gabapentin (NEURONTIN) 300 MG capsule Take 300 mg by mouth at bedtime.   Yes [provider]  hydrALAZINE (APRESOLINE) 25 MG tablet Take 25 mg by mouth 3 (three) times daily.   Yes [provider]  ipratropium (ATROVENT) 0.03 % nasal spray Place 2 sprays into both nostrils 2 (two) times daily.    Yes [provider]  ipratropium-albuterol (DUONEB) 0.5-2.5 (3) MG/3ML SOLN Take 3 mLs by nebulization 2 (two) times daily.    Yes [provider]  levETIRAcetam (KEPPRA XR) 500 MG 24 hr tablet Take 1 tablet (500 mg total) by mouth daily. 11/19/17  Yes Venancio Poisson, NP  linagliptin (TRADJENTA) 5 MG TABS tablet Take 5 mg by mouth daily.   Yes [provider]  LORazepam (ATIVAN) 2 MG/ML injection Inject 0.5 mLs (1 mg total) into the muscle as needed for seizure. Patient taking differently: Inject 1 mg into the muscle daily as needed for seizure.  02/28/18  Yes Ojie, Jude, MD  meclizine (ANTIVERT) 25 MG tablet Take 25 mg by mouth every 12 (twelve) hours as needed for dizziness.    Yes [provider]  Multiple Vitamin (MULTIVITAMIN WITH MINERALS) TABS tablet Take 1 tablet by mouth daily.   Yes [provider]  pantoprazole (PROTONIX) 40 MG tablet Take 40 mg by mouth daily.   Yes [provider]  cefdinir (OMNICEF) 300 MG capsule Take 1 capsule (300 mg total) by mouth 2 (two) times daily. Patient not taking: Reported on 04/28/2018 04/15/18   Salary, Holly Bodily D, MD  nystatin (MYCOSTATIN) 100000 UNIT/ML suspension Use as directed 5 mLs (500,000 Units total) in the mouth or throat 4 (four) times daily. Patient not taking: Reported on 04/28/2018 04/15/18   Salary, Holly Bodily D, MD  oseltamivir (TAMIFLU) 30 MG capsule Take 1 capsule (30 mg total) by mouth 2 (two) times daily. Patient not taking:  Reported on 04/28/2018 04/15/18   Salary, Holly Bodily D, MD  predniSONE (DELTASONE) 50 MG tablet 1 p.o. daily Patient not taking: Reported on 04/28/2018 04/15/18   Salary, Avel Peace, MD    Current Facility-Administered Medications:  .  0.9 %  sodium chloride infusion, , Intravenous, PRN, Epifanio Lesches, MD .  acetaminophen (TYLENOL) tablet 650 mg, 650 mg, Oral, Q6H PRN **OR** acetaminophen (TYLENOL) suppository 650 mg, 650 mg, Rectal, Q6H PRN, Epifanio Lesches, MD .  acetaminophen (TYLENOL) tablet 650 mg, 650 mg, Oral, TID, Epifanio Lesches, MD .  albuterol (PROVENTIL) (2.5 MG/3ML) 0.083% nebulizer solution 2.5 mg, 2.5 mg, Nebulization, Q8H PRN, Epifanio Lesches, MD .  atorvastatin (LIPITOR) tablet 80 mg, 80 mg, Oral, QPM, Konidena, Snehalatha, MD .  bisacodyl (DULCOLAX) EC tablet 5 mg, 5 mg, Oral, Daily PRN,  Konidena, Snehalatha, MD .  cholecalciferol (VITAMIN D3) tablet 1,000 Units, 1,000 Units, Oral, Daily, Konidena, Snehalatha, MD .  diltiazem (CARDIZEM SR) 12 hr capsule 120 mg, 120 mg, Oral, BID, Konidena, Snehalatha, MD, 120 mg at 04/28/18 1542 .  docusate sodium (COLACE) capsule 100 mg, 100 mg, Oral, BID, Konidena, Snehalatha, MD .  FLUoxetine (PROZAC) capsule 40 mg, 40 mg, Oral, Daily, Konidena, Snehalatha, MD .  gabapentin (NEURONTIN) capsule 300 mg, 300 mg, Oral, QHS, Konidena, Snehalatha, MD .  hydrALAZINE (APRESOLINE) tablet 25 mg, 25 mg, Oral, TID, Konidena, Snehalatha, MD .  levETIRAcetam (KEPPRA XR) 24 hr tablet 500 mg, 500 mg, Oral, Daily, Konidena, Snehalatha, MD, 500 mg at 04/28/18 1542 .  LORazepam (ATIVAN) injection 1 mg, 1 mg, Intravenous, PRN, Konidena, Snehalatha, MD .  meclizine (ANTIVERT) tablet 25 mg, 25 mg, Oral, Q12H PRN, Konidena, Snehalatha, MD .  ondansetron (ZOFRAN) tablet 4 mg, 4 mg, Oral, Q6H PRN **OR** ondansetron (ZOFRAN) injection 4 mg, 4 mg, Intravenous, Q6H PRN, Konidena, Snehalatha, MD .  pantoprazole (PROTONIX) 80 mg in sodium chloride 0.9 % 100  mL IVPB, 80 mg, Intravenous, Once, Konidena, Snehalatha, MD, Last Rate: 300 mL/hr at 04/28/18 1553, 80 mg at 04/28/18 1553 .  pantoprazole (PROTONIX) 80 mg in sodium chloride 0.9 % 250 mL (0.32 mg/mL) infusion, 8 mg/hr, Intravenous, Continuous, Konidena, Snehalatha, MD, Last Rate: 25 mL/hr at 04/28/18 1548, 8 mg/hr at 04/28/18 1548 .  [START ON 05/02/2018] pantoprazole (PROTONIX) EC tablet 40 mg, 40 mg, Oral, Daily, Konidena, Snehalatha, MD .  [START ON 05/01/2018] pantoprazole (PROTONIX) injection 40 mg, 40 mg, Intravenous, Q12H, Konidena, Snehalatha, MD .  traZODone (DESYREL) tablet 25 mg, 25 mg, Oral, QHS PRN, Konidena, Snehalatha, MD  Family History  Problem Relation Age of Onset  . Alcohol abuse Mother   . Sickle cell anemia Daughter   . Hypertension Son   . Cancer Neg Hx   . COPD Neg Hx   . Diabetes Neg Hx   . Heart disease Neg Hx   . Stroke Neg Hx      Social History   Tobacco Use  . Smoking status: Former Smoker    Packs/day: 0.50    Years: 60.00    Pack years: 30.00    Types: Cigarettes  . Smokeless tobacco: Never Used  . Tobacco comment: quite 6mo ago-03/26/18  Substance Use Topics  . Alcohol use: No    Alcohol/week: 0.0 standard drinks    Comment: rare  . Drug use: No    Allergies as of 04/28/2018 - Review Complete 04/28/2018  Allergen Reaction Noted  . Bee venom Swelling 01/31/2016  . Enalapril maleate Swelling 05/20/2013    Review of Systems:    All systems reviewed and negative except where noted in HPI.   Physical Exam:  Vital signs in last 24 hours: Temp:  [98.2 F (36.8 C)-98.4 F (36.9 C)] 98.4 F (36.9 C) (03/15 1150) Pulse Rate:  [76-99] 84 (03/15 1150) Resp:  [15-24] 18 (03/15 1150) BP: (109-138)/(57-87) 111/72 (03/15 1150) SpO2:  [96 %-99 %] 98 % (03/15 1150) Weight:  [118.5 kg] 118.5 kg (03/15 0837) Last BM Date: 04/27/18 General:   Pleasant, cooperative in NAD Head:  Normocephalic and atraumatic. Eyes:   No icterus.   Conjunctiva pink.  PERRLA. Ears:  Normal auditory acuity. Neck:  Supple; no masses or thyroidomegaly Lungs: Respirations even and unlabored. Lungs clear to auscultation bilaterally.   No wheezes, crackles, or rhonchi.  Heart:  Regular rate and rhythm;  Without murmur,   clicks, rubs or gallops Abdomen:  Soft, morbidly obese, nondistended, nontender. Normal bowel sounds.  Limited abdominal exam due to body habitus, no rebound guarding or rigidity  Rectal:  Not performed. Msk:  Symmetrical without gross deformities.  Strength right-sided weakness Extremities:  Without edema, cyanosis or clubbing. Neurologic:  Alert and oriented x3;  grossly normal neurologically. Skin:  Intact without significant lesions or rashes. Cervical Nodes:  No significant cervical adenopathy. Psych:  Alert and cooperative. Normal affect.  LAB RESULTS: CBC Latest Ref Rng & Units 04/28/2018 04/28/2018 04/13/2018  WBC 4.0 - 10.5 K/uL 7.5 6.2 4.0  Hemoglobin 12.0 - 15.0 g/dL 8.1(L) 8.5(L) 9.1(L)  Hematocrit 36.0 - 46.0 % 26.1(L) 27.5(L) 29.2(L)  Platelets 150 - 400 K/uL 188 198 213    BMET BMP Latest Ref Rng & Units 04/28/2018 04/15/2018 04/13/2018  Glucose 70 - 99 mg/dL 106(H) 97 109(H)  BUN 8 - 23 mg/dL 12 26(H) 31(H)  Creatinine 0.44 - 1.00 mg/dL 1.39(H) 1.85(H) 1.96(H)  BUN/Creat Ratio 12 - 28 - - -  Sodium 135 - 145 mmol/L 137 141 137  Potassium 3.5 - 5.1 mmol/L 4.0 3.7 4.2  Chloride 98 - 111 mmol/L 102 103 100  CO2 22 - 32 mmol/L _0 Calcium 8.9 - 10.3 mg/dL 9.0 8.8(L) 8.8(L)    LFT Hepatic Function Latest Ref Rng & Units 04/28/2018 04/13/2018 12/25/2017  Total Protein 6.5 - 8.1 g/dL 6.4(L) 6.8 6.6  Albumin 3.5 - 5.0 g/dL 2.7(L) 3.0(L) 3.0(L)  AST 15 - 41 U/L 34 29 19  ALT 0 - 44 U/L 33 19 13  Alk Phosphatase 38 - 126 U/L 109 71 80  Total Bilirubin 0.3 - 1.2 mg/dL 0.4 0.9 0.8     STUDIES: No results found.    Impression / Plan:   Katherine [name] is a 78 y.o. female with morbid obesity, metabolic syndrome, COPD,  history of stroke on Plavix, congestive heart failure admitted with 1 episode of hematemesis with mild anemia compared to baseline.  Hemodynamically stable.  She does not have elevated BUN to creatinine ratio to suggest significant upper GI bleed.  Most likely due to erosive esophagitis or gastritis or gastric erosions.  She does not have cirrhosis Patient does not have any further episodes of hematemesis so far  Continue pantoprazole drip for next 24 hours If hemoglobin is stable with no further episodes of hematemesis, switched to Protonix 40 mg twice daily Continue to hold Plavix at this time Monitor CBC daily, check iron studies, B12 and folate levels, check erythropoietin levels Part of her chronic anemia could be secondary to underlying CKD Recommend to discharge patient on Protonix 40 mg daily as long as patient is on Plavix If no further episodes of bleeding, Plavix can be resumed upon discharge Given her multiple comorbidities and isolated episode of small-volume hematemesis, performing upper endoscopy is associated with higher risk compared to benefits at this time Okay to switch to clear liquid diet  Thank you for involving me in the care of this patient.  Dr. Allen Norris to cover from tomorrow    LOS: 0 days   Sherri Sear, MD  04/28/2018, 3:55 PM   Note: This dictation was prepared with Dragon dictation along with smaller phrase technology. Any transcriptional errors that result from this process are unintentional.

## 2018-04-28 NOTE — H&P (Signed)
[facility] NAME: Katherine [name]    MR#:  [id number]  DATE OF BIRTH:  1940-07-02  DATE OF ADMISSION:  04/28/2018  PRIMARY CARE PHYSICIAN: Valerie Roys, DO   REQUESTING/REFERRING PHYSICIAN: Dr. Lenise Arena  CHIEF COMPLAINT: Hematemesis   Chief Complaint  Patient presents with  . Hematemesis    HISTORY OF PRESENT ILLNESS:  Katherine [name]  is a 78 y.o. female with a known history of diastolic heart failure, COPD on chronic oxygen 2 L, CKD stage III, history of previous stroke, seizures comes in with hematemesis, vomited about 100 mL blood this morning.  No abdominal pain or rectal bleeding.  Patient takes Plavix for history of stroke.  It is 8.5 patient was here last month for flu.  Denies any dizziness.  PAST MEDICAL HISTORY:   Past Medical History:  Diagnosis Date  . Angioedema   . Anxiety   . Anxiety and depression   . CHF (congestive heart failure) ([place])   . CHF (congestive heart failure) ([place])   . Chronic constipation   . Chronic kidney disease    stage 3  . COPD (chronic obstructive pulmonary disease) ([place])   . Diabetes mellitus without complication ([place])   . Gallstones   . GERD (gastroesophageal reflux disease)   . Hypertension   . Left ventricular hypertrophy   . Osteoarthritis   . Prurigo nodularis   . Seizures ([place])   . Stroke ([place])   . Tobacco abuse   . Vitamin D deficiency disease     PAST SURGICAL HISTOIRY:   Past Surgical History:  Procedure Laterality Date  . CHOLECYSTECTOMY    . 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   Procedure: TOTAL KNEE ARTHROPLASTY;  Surgeon: Hessie Knows, MD;  Location: [facility] ORS;  Service: Orthopedics;  Laterality: Right;    SOCIAL HISTORY:   Social History   Tobacco Use  . Smoking status: Former Smoker    Packs/day: 0.50    Years: 60.00    Pack years: 30.00    Types: Cigarettes  . Smokeless tobacco: Never  Used  . Tobacco comment: quite 54mo ago-03/26/18  Substance Use Topics  . Alcohol use: No    Alcohol/week: 0.0 standard drinks    Comment: rare    FAMILY HISTORY:   Family History  Problem Relation Age of Onset  . Alcohol abuse Mother   . Sickle cell anemia Daughter   . Hypertension Son   . Cancer Neg Hx   . COPD Neg Hx   . Diabetes Neg Hx   . Heart disease Neg Hx   . Stroke Neg Hx     DRUG ALLERGIES:   Allergies  Allergen Reactions  . Bee Venom Swelling  . Enalapril Maleate Swelling    REVIEW OF SYSTEMS:  CONSTITUTIONAL: No fever, fatigue or weakness.  EYES: No blurred or double vision.  EARS, NOSE, AND THROAT: No tinnitus or ear pain.  RESPIRATORY: No cough, shortness of breath, wheezing or hemoptysis.  CARDIOVASCULAR: No chest pain, orthopnea, edema.  GASTROINTESTINAL: Hematemesis this morning.  No abdominal pain, rectal bleeding. GENITOURINARY: No dysuria, hematuria.  ENDOCRINE: No polyuria, nocturia,  HEMATOLOGY: No anemia, easy bruising or bleeding SKIN: No rash or lesion. MUSCULOSKELETAL: No joint pain or arthritis.   NEUROLOGIC: No tingling, numbness, weakness.  PSYCHIATRY: No anxiety or depression.   MEDICATIONS AT HOME:   Prior to Admission medications   Medication Sig Start  Date End Date Taking? Authorizing Provider  acetaminophen (TYLENOL) 650 MG CR tablet Take 650 mg by mouth 3 (three) times daily.    Yes [provider]  albuterol (ACCUNEB) 0.63 MG/3ML nebulizer solution Take 3 mLs by nebulization every 8 (eight) hours as needed for wheezing.    Yes [provider]  albuterol (PROVENTIL HFA;VENTOLIN HFA) 108 (90 Base) MCG/ACT inhaler Inhale 2 puffs into the lungs every 4 (four) hours as needed for wheezing or shortness of breath.   Yes [provider]  atorvastatin (LIPITOR) 80 MG tablet Take 80 mg by mouth every evening.    Yes [provider]  cetirizine (ZYRTEC) 10 MG tablet Take 5 mg by mouth at bedtime.   Yes  [provider]  cholecalciferol (VITAMIN D) 1000 units tablet Take 1,000 Units by mouth daily.   Yes [provider]  clopidogrel (PLAVIX) 75 MG tablet Take 75 mg by mouth daily.   Yes [provider]  diclofenac sodium (VOLTAREN) 1 % GEL Apply 2 g topically every 12 (twelve) hours as needed (for left knee pain).    Yes [provider]  diltiazem (CARDIZEM SR) 120 MG 12 hr capsule Take 120 mg by mouth 2 (two) times daily.   Yes [provider]  FLUoxetine (PROZAC) 40 MG capsule Take 40 mg by mouth daily.    Yes [provider]  Fluticasone-Umeclidin-Vilant 100-62.5-25 MCG/INH AEPB Inhale 1 puff into the lungs daily.   Yes [provider]  furosemide (LASIX) 20 MG tablet Take 1 tablet (20 mg total) by mouth daily. 11/14/17  Yes Salary, Avel Peace, MD  gabapentin (NEURONTIN) 300 MG capsule Take 300 mg by mouth at bedtime.   Yes [provider]  hydrALAZINE (APRESOLINE) 25 MG tablet Take 25 mg by mouth 3 (three) times daily.   Yes [provider]  ipratropium (ATROVENT) 0.03 % nasal spray Place 2 sprays into both nostrils 2 (two) times daily.    Yes [provider]  ipratropium-albuterol (DUONEB) 0.5-2.5 (3) MG/3ML SOLN Take 3 mLs by nebulization 2 (two) times daily.    Yes [provider]  levETIRAcetam (KEPPRA XR) 500 MG 24 hr tablet Take 1 tablet (500 mg total) by mouth daily. 11/19/17  Yes Venancio Poisson, NP  linagliptin (TRADJENTA) 5 MG TABS tablet Take 5 mg by mouth daily.   Yes [provider]  LORazepam (ATIVAN) 2 MG/ML injection Inject 0.5 mLs (1 mg total) into the muscle as needed for seizure. Patient taking differently: Inject 1 mg into the muscle daily as needed for seizure.  02/28/18  Yes Ojie, Jude, MD  meclizine (ANTIVERT) 25 MG tablet Take 25 mg by mouth every 12 (twelve) hours as needed for dizziness.    Yes [provider]  Multiple Vitamin (MULTIVITAMIN WITH  MINERALS) TABS tablet Take 1 tablet by mouth daily.   Yes [provider]  pantoprazole (PROTONIX) 40 MG tablet Take 40 mg by mouth daily.   Yes [provider]  cefdinir (OMNICEF) 300 MG capsule Take 1 capsule (300 mg total) by mouth 2 (two) times daily. Patient not taking: Reported on 04/28/2018 04/15/18   Salary, Holly Bodily D, MD  nystatin (MYCOSTATIN) 100000 UNIT/ML suspension Use as directed 5 mLs (500,000 Units total) in the mouth or throat 4 (four) times daily. Patient not taking: Reported on 04/28/2018 04/15/18   Salary, Holly Bodily D, MD  oseltamivir (TAMIFLU) 30 MG capsule Take 1 capsule (30 mg total) by mouth 2 (two) times daily. Patient not  taking: Reported on 04/28/2018 04/15/18   Salary, Holly Bodily D, MD  predniSONE (DELTASONE) 50 MG tablet 1 p.o. daily Patient not taking: Reported on 04/28/2018 04/15/18   Salary, Holly Bodily D, MD      VITAL SIGNS:  Blood pressure (!) 109/57, pulse 82, temperature 98.2 F (36.8 C), temperature source Oral, resp. rate 19, weight 118.5 kg, SpO2 97 %.  PHYSICAL EXAMINATION:  GENERAL:  78 y.o.-year-old patient lying in the bed with no acute distress.  EYES: Pupils equal, round, reactive to light and accommodation. No scleral icterus. Extraocular muscles intact.  HEENT: Head atraumatic, normocephalic. Oropharynx and nasopharynx clear.  NECK:  Supple, no jugular venous distention. No thyroid enlargement, no tenderness.  LUNGS: Normal breath sounds bilaterally, no wheezing, rales,rhonchi or crepitation. No use of accessory muscles of respiration.  CARDIOVASCULAR: S1, S2 normal. No murmurs, rubs, or gallops.  ABDOMEN: Soft, nontender, nondistended. Bowel sounds present. No organomegaly or mass.  EXTREMITIES: No pedal edema, cyanosis, or clubbing.  NEUROLOGIC: Cranial nerves II through XII are intact. Muscle strength 5/5 in all extremities. Sensation intact. Gait not checked.  PSYCHIATRIC: The patient is alert and oriented x 3.  SKIN: No obvious rash,  lesion, or ulcer.   LABORATORY PANEL:   CBC Recent Labs  Lab 04/28/18 0840  WBC 6.2  HGB 8.5*  HCT 27.5*  PLT 198   ------------------------------------------------------------------------------------------------------------------  Chemistries  Recent Labs  Lab 04/28/18 0840  NA 137  K 4.0  CL 102  CO2 26  GLUCOSE 106*  BUN 12  CREATININE 1.39*  CALCIUM 9.0  AST 34  ALT 33  ALKPHOS 109  BILITOT 0.4   ------------------------------------------------------------------------------------------------------------------  Cardiac Enzymes No results for input(s): TROPONINI in the last 168 hours. ------------------------------------------------------------------------------------------------------------------  RADIOLOGY:  No results found.  EKG:   Orders placed or performed during the hospital encounter of 04/13/18  . EKG 12-Lead  . EKG 12-Lead  . ED EKG  . ED EKG    IMPRESSION AND PLAN:   78 year old female with multiple medical problems of diastolic heart failure, diabetes mellitus type 2, COPD on chronic oxygen 2 L, history of previous stroke, seizure disorder comes in because of hematemesis. #1. upper hematemesis likely upper GI bleed with possible gastritis from Plavix, admit to MedSurg under observation status, patient hemodynamically stable with stable hemoglobin at 8.5.  Last month hemoglobin was 9.1.  No occasion for blood transfusion at this time.  Check CBC every 8 hours, continue Protonix infusion, hold Plavix, I spoke with Dr. Marius Ditch the gastroenterologist on call he will see the patient, will keep patient n.p.o. at this time. #2 history of CKD stage III, creatinine is 1.39, patient GFR is 43. 3.  History of COPD, on oxygen 2 L all the time, no wheezing.  Continue bronchodilators. 4.  History of diastolic heart failure: Euvolemic.,  Hold Lasix because of GI bleed. 5.  Diabetes mellitus type 2: Patient is n.p.o., will use sliding scale insulin with  coverage. 6.  History of seizure disorder, no seizures at this time, continue Keppra 500 mg daily #7 .history of stroke, continue statins, hold Plavix at this time. 8.  Essential hypertension, controlled, continue Cardizem SR 120 mg p.o. twice daily.,  Hydralazine 25 mg p.o. 3 times daily  9 .morbid obesity.  BMI 42.17, advised patient to lose weight.   All the records are reviewed and case discussed with ED provider. Management plans discussed with the patient, family and they are in agreement.  CODE STATUS: Full code  TOTAL TIME TAKING  CARE OF THIS PATIENT: 55 minutes.    Epifanio Lesches M.D on 04/28/2018 at 11:48 AM  Between 7am to 6pm - Pager - [phone]  After 6pm go to [email or web address] - password [email or web address] [facility]  Office  [phone]  CC: Primary care physician; Valerie Roys, DO  Note: This dictation was prepared with Dragon dictation along with smaller phrase technology. Any transcriptional errors that result from this process are unintentional.

## 2018-04-28 NOTE — Progress Notes (Signed)
Per lab unable to draw CBC due to difficult stick. Only one phlebotomist till 2000. CBC will be delayed draw until after 2000.   Fuller Mandril, RN

## 2018-04-28 NOTE — ED Notes (Signed)
Pt husband back at bedside, updated on pt condition.

## 2018-04-29 DIAGNOSIS — Z7401 Bed confinement status: Secondary | ICD-10-CM | POA: Diagnosis not present

## 2018-04-29 DIAGNOSIS — J449 Chronic obstructive pulmonary disease, unspecified: Secondary | ICD-10-CM | POA: Diagnosis not present

## 2018-04-29 DIAGNOSIS — K920 Hematemesis: Secondary | ICD-10-CM | POA: Diagnosis not present

## 2018-04-29 DIAGNOSIS — M2550 Pain in unspecified joint: Secondary | ICD-10-CM | POA: Diagnosis not present

## 2018-04-29 DIAGNOSIS — R58 Hemorrhage, not elsewhere classified: Secondary | ICD-10-CM | POA: Diagnosis not present

## 2018-04-29 DIAGNOSIS — I10 Essential (primary) hypertension: Secondary | ICD-10-CM | POA: Diagnosis not present

## 2018-04-29 LAB — CBC
HCT: 25 % — ABNORMAL LOW (ref 36.0–46.0)
HCT: 25.8 % — ABNORMAL LOW (ref 36.0–46.0)
HCT: 26.4 % — ABNORMAL LOW (ref 36.0–46.0)
Hemoglobin: 7.8 g/dL — ABNORMAL LOW (ref 12.0–15.0)
Hemoglobin: 7.9 g/dL — ABNORMAL LOW (ref 12.0–15.0)
Hemoglobin: 8.2 g/dL — ABNORMAL LOW (ref 12.0–15.0)
MCH: 27.2 pg (ref 26.0–34.0)
MCH: 26.9 pg (ref 26.0–34.0)
MCH: 27.1 pg (ref 26.0–34.0)
MCHC: 31.2 g/dL (ref 30.0–36.0)
MCHC: 30.6 g/dL (ref 30.0–36.0)
MCHC: 31.1 g/dL (ref 30.0–36.0)
MCV: 87.1 fL (ref 80.0–100.0)
MCV: 87.8 fL (ref 80.0–100.0)
MCV: 87.1 fL (ref 80.0–100.0)
NRBC: 0 % (ref 0.0–0.2)
NRBC: 0 % (ref 0.0–0.2)
Platelets: 191 10*3/uL (ref 150–400)
Platelets: 190 10*3/uL (ref 150–400)
Platelets: 176 10*3/uL (ref 150–400)
RBC: 2.87 MIL/uL — ABNORMAL LOW (ref 3.87–5.11)
RBC: 2.94 MIL/uL — ABNORMAL LOW (ref 3.87–5.11)
RBC: 3.03 MIL/uL — AB (ref 3.87–5.11)
RDW: 17.2 % — ABNORMAL HIGH (ref 11.5–15.5)
RDW: 17.1 % — AB (ref 11.5–15.5)
RDW: 16.9 % — ABNORMAL HIGH (ref 11.5–15.5)
WBC: 5 10*3/uL (ref 4.0–10.5)
WBC: 5.1 10*3/uL (ref 4.0–10.5)
WBC: 5.3 10*3/uL (ref 4.0–10.5)
nRBC: 0 % (ref 0.0–0.2)

## 2018-04-29 LAB — BASIC METABOLIC PANEL
Anion gap: 5 (ref 5–15)
BUN: 11 mg/dL (ref 8–23)
CO2: 27 mmol/L (ref 22–32)
CREATININE: 1.39 mg/dL — AB (ref 0.44–1.00)
Calcium: 8.8 mg/dL — ABNORMAL LOW (ref 8.9–10.3)
Chloride: 109 mmol/L (ref 98–111)
GFR calc Af Amer: 43 mL/min — ABNORMAL LOW (ref >60)
GFR calc non Af Amer: 37 mL/min — ABNORMAL LOW (ref >60)
Glucose, Bld: 97 mg/dL (ref 70–99)
Potassium: 4.5 mmol/L (ref 3.5–5.1)
Sodium: 141 mmol/L (ref 135–145)

## 2018-04-29 LAB — GLUCOSE, CAPILLARY: Glucose-Capillary: 95 mg/dL (ref 70–99)

## 2018-04-29 LAB — HEMOGLOBIN: Hemoglobin: 8.2 g/dL — ABNORMAL LOW (ref 12.0–15.0)

## 2018-04-29 MED ORDER — CYANOCOBALAMIN 1000 MCG PO TABS: 1000.0000 ug | ORAL_TABLET | Freq: Every day | ORAL | 0 refills | Status: DC

## 2018-04-29 MED ORDER — PANTOPRAZOLE SODIUM 40 MG PO TBEC: 40.0000 mg | DELAYED_RELEASE_TABLET | Freq: Every day | ORAL | 0 refills | Status: DC

## 2018-04-29 MED ORDER — VITAMIN B-12 1000 MCG PO TABS
1000.0000 ug | ORAL_TABLET | Freq: Every day | ORAL | Status: DC
  Administered 2018-04-29: 1000 ug via ORAL
  Filled 2018-04-29: qty 1

## 2018-04-29 MED ORDER — SODIUM CHLORIDE 0.9 % IV BOLUS
500.0000 mL | Freq: Once | INTRAVENOUS | Status: AC
  Administered 2018-04-29: 500 mL via INTRAVENOUS

## 2018-04-29 MED ORDER — SODIUM CHLORIDE 0.9 % IV SOLN
100.0000 mg | Freq: Once | INTRAVENOUS | Status: AC
  Administered 2018-04-29: 100 mg via INTRAVENOUS
  Filled 2018-04-29: qty 5

## 2018-04-29 MED ORDER — PANTOPRAZOLE SODIUM 40 MG PO TBEC: 40.0000 mg | DELAYED_RELEASE_TABLET | Freq: Two times a day (BID) | ORAL | 0 refills | Status: DC

## 2018-04-29 MED ORDER — DILTIAZEM HCL ER 60 MG PO CP12: 60.0000 mg | ORAL_CAPSULE | Freq: Two times a day (BID) | ORAL | 0 refills | Status: DC

## 2018-04-29 MED ORDER — CYANOCOBALAMIN 1000 MCG/ML IJ SOLN
1000.0000 ug | Freq: Once | INTRAMUSCULAR | Status: AC
  Administered 2018-04-29: 1000 ug via INTRAMUSCULAR
  Filled 2018-04-29: qty 1

## 2018-04-29 NOTE — Consult Note (Signed)
MEDICATION RELATED CONSULT NOTE - FOLLOW UP   Pharmacy Consult for IV Iron therapy Indication: Hematemesis/low iron  Allergies  Allergen Reactions  . Bee Venom Swelling  . Enalapril Maleate Swelling    Patient Measurements: Weight: 261 lb 4.8 oz (118.5 kg) Adjusted Body Weight: 83 kg  Vital Signs: Temp: 97.9 F (36.6 C) (03/16 0537) Temp Source: Oral (03/16 0537) BP: 130/61 (03/16 0537) Pulse Rate: 80 (03/16 0537) Intake/Output from previous day: 03/15 0701 - 03/16 0700 In: 1808.2 [P.O.:1440; I.V.:368.2] Out: 400 [Urine:400] Intake/Output from this shift: No intake/output data recorded.  Labs: Recent Labs    04/28/18 0840 04/28/18 1357 04/28/18 2047 04/29/18 0441  WBC 6.2 7.5 5.7 5.0  HGB 8.5* 8.1* 8.0* 7.8*  HCT 27.5* 26.1* 26.0* 25.0*  PLT 198 188 193 191  CREATININE 1.39*  --   --  1.39*  ALBUMIN 2.7*  --   --   --   PROT 6.4*  --   --   --   AST 34  --   --   --   ALT 33  --   --   --   ALKPHOS 109  --   --   --   BILITOT 0.4  --   --   --    Estimated Creatinine Clearance: 45.1 mL/min (A) (by C-G formula based on SCr of 1.39 mg/dL (H)). Iron/TIBC/Ferritin/ %Sat    Component Value Date/Time   IRON 14 (L) 04/28/2018 2047   TIBC 261 04/28/2018 2047   FERRITIN 70 04/13/2018 1837   IRONPCTSAT 5 (L) 04/28/2018 2047      Microbiology: Recent Results (from the past 720 hour(s))  Urine Culture     Status: Abnormal   Collection Time: 04/13/18  2:33 PM  Result Value Ref Range Status   Specimen Description   Final    URINE, CATHETERIZED Performed at [facility], [address] [place], [place] [address]    Special Requests   Final    Normal Performed at [facility], [address] [place], [place] [address]    Culture (A)  Final    >=100,000 COLONIES/mL VANCOMYCIN RESISTANT ENTEROCOCCUS   Report Status 04/16/2018 FINAL  Final   Organism ID, Bacteria VANCOMYCIN RESISTANT ENTEROCOCCUS (A)  Final      Susceptibility   Vancomycin resistant enterococcus - MIC*    AMPICILLIN >=32 RESISTANT Resistant     LEVOFLOXACIN >=8 RESISTANT Resistant     NITROFURANTOIN 64 INTERMEDIATE Intermediate     VANCOMYCIN >=32 RESISTANT Resistant     LINEZOLID 2 SENSITIVE Sensitive     * >=100,000 COLONIES/mL VANCOMYCIN RESISTANT ENTEROCOCCUS  MRSA PCR Screening     Status: Abnormal   Collection Time: 04/14/18  6:45 AM  Result Value Ref Range Status   MRSA by PCR POSITIVE (A) NEGATIVE Final    Comment:        The GeneXpert MRSA Assay (FDA approved for NASAL specimens only), is one component of a comprehensive MRSA colonization surveillance program. It is not intended to diagnose MRSA infection nor to guide or monitor treatment for MRSA infections. RESULT CALLED TO, READ BACK BY AND VERIFIED WITH: DONNA HAISLIP IN 04/14/2018 AT 0840 QSD Performed at [facility], [address] [place], [place] [address]     Medications:  No iron replacement therapy prior to admission  Assessment: Pt with hematemesis HGB is 7.8 and iron studies reveal iron to be 14 ug/dL (RR 28-170 ug/dL)  Goal of Therapy:  Iron wnl's  Plan:  Will dose 100mg  Venofer and re-evaluate iron as directed by hospitalist - Patient weight and BMI may indicate a greater following dose of 200mg , but sending 100mg  for first dose to assure patient tolerates.  Will discuss with hospitalist further IV iron needs vs oral therapy options.   Lu Duffel, PharmD, BCPS Clinical Pharmacist 04/29/2018 8:54 AM

## 2018-04-29 NOTE — Progress Notes (Signed)
MD notified: BP meds held Hydralazine, Diltiazem. BP checked two times and it is soft 90/47 (MAP 61).

## 2018-04-29 NOTE — NC FL2 (Signed)
[place] LEVEL OF CARE SCREENING TOOL     IDENTIFICATION  Patient Name: Katherine [name] Birthdate: 02-13-1941 Sex: female Admission Date (Current Location): 04/28/2018  [place] and [place] Number:  Engineering geologist and Address:  [facility], [address] [place], [place] [address]      Provider Number: [id number]  Attending Physician Name and Address:  Bettey Costa, MD  Relative Name and Phone Number:       Current Level of Care: Hospital Recommended Level of Care: [place] Prior Approval Number:    Date Approved/Denied:   PASRR Number:    Discharge Plan: SNF    Current Diagnoses: Patient Active Problem List   Diagnosis Date Noted  . Hematemesis 04/28/2018  . Influenza A 04/13/2018  . Acute on chronic congestive heart failure ([place])   . COPD with acute exacerbation ([place]) 02/24/2018  . Chronic diastolic heart failure ([place]) 12/31/2017  . Lymphedema 12/31/2017  . COPD exacerbation ([place]) 11/30/2017  . Urinary tract infection 11/22/2017  . 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[place]) 11/16/2017  . Diabetes mellitus type 2, uncomplicated ([place]) 94/70/9628  . Pressure injury of skin 10/29/2017  . Acute kidney injury superimposed on CKD ([place]) 10/24/2017  . Severe sepsis ([place]) 10/24/2017  . Possible Seizures ([place]) 10/08/2017  . Hyperlipemia 10/08/2017  . 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 Stroke-like episode ([place]) s/p IV tpa 10/04/2017  . Palliative care by specialist   . Elevated rheumatoid factor 09/05/2017  . Frequent hospital admissions 08/22/2017  . Aphasia 06/28/2017  . Hand pain 03/21/2017  . Dry skin 03/21/2017  . Pain in finger of left hand 09/12/2016  . Chronic fatigue 06/12/2016  . Left knee pain 06/12/2016  . Goals of care, counseling/discussion 03/13/2016  . Primary localized osteoarthritis of right knee 02/08/2016  . OSA (obstructive sleep apnea) 09/16/2015  . Rotator cuff  syndrome 09/07/2015  . 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 Sleep disturbance 04/14/2015  . Coronary artery disease 03/14/2015  . 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 Acute respiratory failure with hypoxia ([place]) 09/16/2014  . 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 Anxiety   . Tobacco abuse   . Prurigo nodularis   . GERD (gastroesophageal reflux disease)   . COPD (chronic obstructive pulmonary disease) ([place])   . Osteoarthritis   . Vitamin D deficiency disease   . Chronic constipation   . CKD (chronic kidney disease), stage III ([place])   . Essential hypertension 05/20/2013  . Morbid obesity ([place]) 05/20/2013    Orientation RESPIRATION BLADDER Height & Weight     Self, Place  Normal, O2 Continent Weight: 118.5 kg Height:     BEHAVIORAL SYMPTOMS/MOOD NEUROLOGICAL BOWEL NUTRITION STATUS    Convulsions/Seizures Continent Diet(Carb modified )  AMBULATORY STATUS COMMUNICATION OF NEEDS Skin   Extensive Assist Verbally Normal                       Personal Care Assistance Level of Assistance  Bathing, Feeding, Dressing Bathing Assistance: Maximum assistance Feeding assistance: Limited assistance Dressing Assistance: Maximum assistance     Functional Limitations Info  Sight, Hearing Sight Info: Impaired Hearing Info: Impaired      SPECIAL CARE FACTORS FREQUENCY                       Contractures Contractures Info: Not present    Additional Factors Info  Code Status, Allergies Code Status Info: full Allergies Info: bee  venom, enalapril           Current Medications (04/29/2018):  This is the current hospital active medication list Current Facility-Administered Medications  Medication Dose Route Frequency Provider Last Rate Last Dose  . 0.9 %  sodium chloride infusion   Intravenous PRN Epifanio Lesches, MD 10 mL/hr at 04/29/18 1133 20 mL at 04/29/18 1133  . acetaminophen (TYLENOL) tablet 650 mg  650 mg Oral Q6H PRN Epifanio Lesches, MD   650 mg at 04/29/18  2947   Or  . acetaminophen (TYLENOL) suppository 650 mg  650 mg Rectal Q6H PRN Epifanio Lesches, MD      . acetaminophen (TYLENOL) tablet 650 mg  650 mg Oral TID Epifanio Lesches, MD   650 mg at 04/29/18 0940  . albuterol (PROVENTIL) (2.5 MG/3ML) 0.083% nebulizer solution 2.5 mg  2.5 mg Nebulization Q8H PRN Epifanio Lesches, MD      . atorvastatin (LIPITOR) tablet 80 mg  80 mg Oral QPM Epifanio Lesches, MD   80 mg at 04/28/18 1848  . bisacodyl (DULCOLAX) EC tablet 5 mg  5 mg Oral Daily PRN Epifanio Lesches, MD      . cholecalciferol (VITAMIN D3) tablet 1,000 Units  1,000 Units Oral Daily Epifanio Lesches, MD   1,000 Units at 04/29/18 0941  . diltiazem (CARDIZEM SR) 12 hr capsule 120 mg  120 mg Oral BID Epifanio Lesches, MD   120 mg at 04/28/18 1542  . docusate sodium (COLACE) capsule 100 mg  100 mg Oral BID Epifanio Lesches, MD   100 mg at 04/29/18 0941  . FLUoxetine (PROZAC) capsule 40 mg  40 mg Oral Daily Epifanio Lesches, MD   40 mg at 04/29/18 0942  . gabapentin (NEURONTIN) capsule 300 mg  300 mg Oral QHS Epifanio Lesches, MD   300 mg at 04/28/18 2304  . hydrALAZINE (APRESOLINE) tablet 25 mg  25 mg Oral TID Epifanio Lesches, MD   Stopped at 04/29/18 1139  . levETIRAcetam (KEPPRA XR) 24 hr tablet 500 mg  500 mg Oral Daily Epifanio Lesches, MD   500 mg at 04/29/18 0942  . LORazepam (ATIVAN) injection 1 mg  1 mg Intravenous PRN Epifanio Lesches, MD      . meclizine (ANTIVERT) tablet 25 mg  25 mg Oral Q12H PRN Epifanio Lesches, MD      . ondansetron (ZOFRAN) tablet 4 mg  4 mg Oral Q6H PRN Epifanio Lesches, MD       Or  . ondansetron (ZOFRAN) injection 4 mg  4 mg Intravenous Q6H PRN Epifanio Lesches, MD      . pantoprazole (PROTONIX) 80 mg in sodium chloride 0.9 % 250 mL (0.32 mg/mL) infusion  8 mg/hr Intravenous Continuous Epifanio Lesches, MD 25 mL/hr at 04/29/18 1138 8 mg/hr at 04/29/18 1138  . [START ON 05/02/2018] pantoprazole  (PROTONIX) EC tablet 40 mg  40 mg Oral Daily Epifanio Lesches, MD      . Derrill Memo ON 05/01/2018] pantoprazole (PROTONIX) injection 40 mg  40 mg Intravenous Q12H Epifanio Lesches, MD      . traZODone (DESYREL) tablet 25 mg  25 mg Oral QHS PRN Epifanio Lesches, MD      . vitamin B-12 (CYANOCOBALAMIN) tablet 1,000 mcg  1,000 mcg Oral Daily Bettey Costa, MD   1,000 mcg at 04/29/18 0940     Discharge Medications: Please see discharge summary for a list of discharge medications.  Relevant Imaging Results:  Relevant Lab Results:   Additional Information SS [id number]  Beverly Sessions, RN

## 2018-04-29 NOTE — Discharge Summary (Addendum)
[place] [facility] [place] NAME: Katherine [name]    MR#:  [id number]  DATE OF BIRTH:  Jul 11, 1940  DATE OF ADMISSION:  04/28/2018 ADMITTING PHYSICIAN: Epifanio Lesches, MD  DATE OF DISCHARGE: 04/29/2018   PRIMARY CARE PHYSICIAN: Valerie Roys, DO    ADMISSION DIAGNOSIS:  Hematemesis with nausea [K92.0]  DISCHARGE DIAGNOSIS:  Active Problems:   Hematemesis   SECONDARY DIAGNOSIS:   Past Medical History:  Diagnosis Date  . Angioedema   . Anxiety   . Anxiety and depression   . CHF (congestive heart failure) ([place])   . CHF (congestive heart failure) ([place])   . Chronic constipation   . Chronic kidney disease    stage 3  . COPD (chronic obstructive pulmonary disease) ([place])   . Diabetes mellitus without complication ([place])   . Gallstones   . GERD (gastroesophageal reflux disease)   . Hypertension   . Left ventricular hypertrophy   . Osteoarthritis   . Prurigo nodularis   . Seizures ([place])   . Stroke ([place])   . Tobacco abuse   . Vitamin D deficiency disease     HOSPITAL COURSE:   78 year old morbidly obese female with a history of chronic diastolic heart failure, CVA on Plavix and COPD presents from [facility] due to hematemesis.  1.  Hematemesis: Patient had no further episodes of hematemesis while in the hospital.  Her hemoglobin upon arrival was 8.5 and remained relatively stable at 7.9.  She did not require blood transfusion.  B12 level was low as well as iron panel showing iron deficiency.  She was provided IV iron and B12 supplementation.  She needs to follow-up with a CBC by her primary care physician and a repeat B12 level in 1 to 2 months. Patient was evaluated GI while in the hospital.  She was temporarily taken off of her Plavix.  Since her hemoglobin remained relatively stable she will be discharged on Protonix 40 mg p.o. daily as well as Plavix.   2.  Essential hypertension: Patient's blood pressure was  fluctuating.  Because of the low/normal blood pressure I have discontinued her blood pressure medications. Her PCP needs to follow her blood pressure. 3.  Hyperlipidemia: Continue statin  4.  History of CVA without focal residual deficits: Patient may resume Plavix and continue statin.  5.  COPD without signs of exacerbation  6.  Morbid obesity: Weight loss as tolerated  7.  Diabetes: Continue ADA diet and outpatient regimen   Outpatient palliative care services recommended DISCHARGE CONDITIONS AND DIET:   Stable for discharge heart healthy diabetic diet  CONSULTS OBTAINED:  Treatment Team:  Lin Landsman, MD  DRUG ALLERGIES:   Allergies  Allergen Reactions  . Bee Venom Swelling  . Enalapril Maleate Swelling    DISCHARGE MEDICATIONS:   Allergies as of 04/29/2018      Reactions   Bee Venom Swelling   Enalapril Maleate Swelling      Medication List    STOP taking these medications   cefdinir 300 MG capsule Commonly known as:  OMNICEF   diltiazem 120 MG 12 hr capsule Commonly known as:  CARDIZEM SR   furosemide 20 MG tablet Commonly known as:  LASIX   hydrALAZINE 25 MG tablet Commonly known as:  APRESOLINE   nystatin 100000 UNIT/ML suspension Commonly known as:  MYCOSTATIN   oseltamivir 30 MG capsule Commonly known as:  TAMIFLU   predniSONE 50 MG tablet Commonly known as:  DELTASONE  TAKE these medications   acetaminophen 650 MG CR tablet Commonly known as:  TYLENOL Take 650 mg by mouth 3 (three) times daily.   albuterol 108 (90 Base) MCG/ACT inhaler Commonly known as:  PROVENTIL HFA;VENTOLIN HFA Inhale 2 puffs into the lungs every 4 (four) hours as needed for wheezing or shortness of breath.   albuterol 0.63 MG/3ML nebulizer solution Commonly known as:  ACCUNEB Take 3 mLs by nebulization every 8 (eight) hours as needed for wheezing.   atorvastatin 80 MG tablet Commonly known as:  LIPITOR Take 80 mg by mouth every evening.    cetirizine 10 MG tablet Commonly known as:  ZYRTEC Take 5 mg by mouth at bedtime.   cholecalciferol 1000 units tablet Commonly known as:  VITAMIN D Take 1,000 Units by mouth daily.   clopidogrel 75 MG tablet Commonly known as:  PLAVIX Take 75 mg by mouth daily.   cyanocobalamin 1000 MCG tablet Take 1 tablet (1,000 mcg total) by mouth daily. Start taking on:  April 30, 2018   diclofenac sodium 1 % Gel Commonly known as:  VOLTAREN Apply 2 g topically every 12 (twelve) hours as needed (for left knee pain).   FLUoxetine 40 MG capsule Commonly known as:  PROZAC Take 40 mg by mouth daily.   Fluticasone-Umeclidin-Vilant 100-62.5-25 MCG/INH Aepb Inhale 1 puff into the lungs daily.   gabapentin 300 MG capsule Commonly known as:  NEURONTIN Take 300 mg by mouth at bedtime.   ipratropium 0.03 % nasal spray Commonly known as:  ATROVENT Place 2 sprays into both nostrils 2 (two) times daily.   ipratropium-albuterol 0.5-2.5 (3) MG/3ML Soln Commonly known as:  DUONEB Take 3 mLs by nebulization 2 (two) times daily.   levETIRAcetam 500 MG 24 hr tablet Commonly known as:  KEPPRA XR Take 1 tablet (500 mg total) by mouth daily.   linagliptin 5 MG Tabs tablet Commonly known as:  TRADJENTA Take 5 mg by mouth daily.   LORazepam 2 MG/ML injection Commonly known as:  ATIVAN Inject 0.5 mLs (1 mg total) into the muscle as needed for seizure. What changed:  when to take this   meclizine 25 MG tablet Commonly known as:  ANTIVERT Take 25 mg by mouth every 12 (twelve) hours as needed for dizziness.   multivitamin with minerals Tabs tablet Take 1 tablet by mouth daily.   pantoprazole 40 MG tablet Commonly known as:  PROTONIX Take 1 tablet (40 mg total) by mouth daily.         Today   CHIEF COMPLAINT:   No acute events overnight   VITAL SIGNS:  Blood pressure (!) 87/76, pulse 79, temperature 99 F (37.2 C), temperature source Oral, resp. rate 16, weight 118.5 kg, SpO2 98  %.   REVIEW OF SYSTEMS:  Review of Systems  Constitutional: Positive for malaise/fatigue. Negative for chills and fever.  HENT: Negative.  Negative for ear discharge, ear pain, hearing loss, nosebleeds and sore throat.   Eyes: Negative.  Negative for blurred vision and pain.  Respiratory: Negative.  Negative for cough, hemoptysis, shortness of breath and wheezing.   Cardiovascular: Negative.  Negative for chest pain, palpitations and leg swelling.  Gastrointestinal: Negative.  Negative for abdominal pain, blood in stool, diarrhea, nausea and vomiting.  Genitourinary: Negative.  Negative for dysuria.  Musculoskeletal: Negative.  Negative for back pain.  Skin: Negative.   Neurological: Negative for dizziness, tremors, speech change, focal weakness, seizures and headaches.  Endo/Heme/Allergies: Negative.  Does not bruise/bleed easily.  Psychiatric/Behavioral: Negative.  Negative for depression, hallucinations and suicidal ideas.     PHYSICAL EXAMINATION:  GENERAL:  78 y.o.-year-old patient lying in the bed with no acute distress.  Morbidly obese NECK:  Supple, no jugular venous distention. No thyroid enlargement, no tenderness.  LUNGS: Normal breath sounds bilaterally, no wheezing, rales,rhonchi  No use of accessory muscles of respiration.  CARDIOVASCULAR: S1, S2 normal. No murmurs, rubs, or gallops.  ABDOMEN: Soft, non-tender, non-distended. Bowel sounds present. No organomegaly or mass.  EXTREMITIES: No pedal edema, cyanosis, or clubbing.  PSYCHIATRIC: The patient is alert and oriented x 3.  SKIN: No obvious rash, lesion, or ulcer.   DATA REVIEW:   CBC Recent Labs  Lab 04/29/18 0757  WBC 5.1  HGB 7.9*  HCT 25.8*  PLT 190    Chemistries  Recent Labs  Lab 04/28/18 0840 04/29/18 0441  NA 137 141  K 4.0 4.5  CL 102 109  CO2 26 27  GLUCOSE 106* 97  BUN 12 11  CREATININE 1.39* 1.39*  CALCIUM 9.0 8.8*  AST 34  --   ALT 33  --   ALKPHOS 109  --   BILITOT 0.4  --      Cardiac Enzymes No results for input(s): TROPONINI in the last 168 hours.  Microbiology Results  @MICRORSLT48 @  RADIOLOGY:  No results found.    Allergies as of 04/29/2018      Reactions   Bee Venom Swelling   Enalapril Maleate Swelling      Medication List    STOP taking these medications   cefdinir 300 MG capsule Commonly known as:  OMNICEF   diltiazem 120 MG 12 hr capsule Commonly known as:  CARDIZEM SR   furosemide 20 MG tablet Commonly known as:  LASIX   hydrALAZINE 25 MG tablet Commonly known as:  APRESOLINE   nystatin 100000 UNIT/ML suspension Commonly known as:  MYCOSTATIN   oseltamivir 30 MG capsule Commonly known as:  TAMIFLU   predniSONE 50 MG tablet Commonly known as:  DELTASONE     TAKE these medications   acetaminophen 650 MG CR tablet Commonly known as:  TYLENOL Take 650 mg by mouth 3 (three) times daily.   albuterol 108 (90 Base) MCG/ACT inhaler Commonly known as:  PROVENTIL HFA;VENTOLIN HFA Inhale 2 puffs into the lungs every 4 (four) hours as needed for wheezing or shortness of breath.   albuterol 0.63 MG/3ML nebulizer solution Commonly known as:  ACCUNEB Take 3 mLs by nebulization every 8 (eight) hours as needed for wheezing.   atorvastatin 80 MG tablet Commonly known as:  LIPITOR Take 80 mg by mouth every evening.   cetirizine 10 MG tablet Commonly known as:  ZYRTEC Take 5 mg by mouth at bedtime.   cholecalciferol 1000 units tablet Commonly known as:  VITAMIN D Take 1,000 Units by mouth daily.   clopidogrel 75 MG tablet Commonly known as:  PLAVIX Take 75 mg by mouth daily.   cyanocobalamin 1000 MCG tablet Take 1 tablet (1,000 mcg total) by mouth daily. Start taking on:  April 30, 2018   diclofenac sodium 1 % Gel Commonly known as:  VOLTAREN Apply 2 g topically every 12 (twelve) hours as needed (for left knee pain).   FLUoxetine 40 MG capsule Commonly known as:  PROZAC Take 40 mg by mouth daily.    Fluticasone-Umeclidin-Vilant 100-62.5-25 MCG/INH Aepb Inhale 1 puff into the lungs daily.   gabapentin 300 MG capsule Commonly known as:  NEURONTIN Take 300 mg by mouth at bedtime.  ipratropium 0.03 % nasal spray Commonly known as:  ATROVENT Place 2 sprays into both nostrils 2 (two) times daily.   ipratropium-albuterol 0.5-2.5 (3) MG/3ML Soln Commonly known as:  DUONEB Take 3 mLs by nebulization 2 (two) times daily.   levETIRAcetam 500 MG 24 hr tablet Commonly known as:  KEPPRA XR Take 1 tablet (500 mg total) by mouth daily.   linagliptin 5 MG Tabs tablet Commonly known as:  TRADJENTA Take 5 mg by mouth daily.   LORazepam 2 MG/ML injection Commonly known as:  ATIVAN Inject 0.5 mLs (1 mg total) into the muscle as needed for seizure. What changed:  when to take this   meclizine 25 MG tablet Commonly known as:  ANTIVERT Take 25 mg by mouth every 12 (twelve) hours as needed for dizziness.   multivitamin with minerals Tabs tablet Take 1 tablet by mouth daily.   pantoprazole 40 MG tablet Commonly known as:  PROTONIX Take 1 tablet (40 mg total) by mouth daily.        Aspirin prescribed at discharge?  Yes High Intensity Statin Prescribed? (Lipitor 40-80mg  or Crestor 20-40mg ): Yes Beta Blocker Prescribed? Yes For EF <40%, was ACEI/ARB Prescribed? Yes ADP Receptor Inhibitor Prescribed? (i.e. Plavix etc.-Includes Medically Managed Patients): Yes For EF <40%, Aldosterone Inhibitor Prescribed? Yes Was EF assessed during THIS hospitalization? Yes Was Cardiac Rehab II ordered? (Included Medically managed Patients): Yes   Management plans discussed with the patient and she is in agreement. Stable for discharge snf  Patient should follow up with pcp  CODE STATUS:     Code Status Orders  (From admission, onward)         Start     Ordered   04/28/18 1049  Full code  Continuous     04/28/18 1053        Code Status History    Date Active Date Inactive Code Status  Order ID Comments User Context   04/13/2018 2359 04/15/2018 2116 Full Code [id number]  Arta Silence, MD Inpatient   02/24/2018 2221 02/28/2018 1815 Full Code [id number]  Dustin Flock, MD Inpatient   12/25/2017 1653 12/26/2017 2152 Full Code [id number]  Gorden Harms, MD Inpatient   11/29/2017 2119 12/05/2017 1841 Full Code [id number]  Gorden Harms, MD Inpatient   11/22/2017 0408 11/26/2017 1804 Full Code [id number]  Arta Silence, MD Inpatient   11/10/2017 1840 11/13/2017 1653 Full Code [id number]  Sela Hua, MD Inpatient   10/24/2017 0247 10/29/2017 2301 Full Code [id number]  Lance Coon, MD Inpatient   10/04/2017 1005 10/10/2017 1813 Full Code [id number]  Marliss Coots, PA-C Inpatient   10/01/2017 0819 10/04/2017 0904 Full Code [id number]  Lolita Cram, RN Inpatient   09/19/2017 1306 09/22/2017 1622 Full Code [id number]  Gorden Harms, MD Inpatient   09/11/2017 2021 09/15/2017 1510 Full Code [id number]  Gorden Harms, MD Inpatient   08/27/2017 1543 08/29/2017 2159 Full Code [id number]  Hillary Bow, MD ED   06/28/2017 1414 06/30/2017 1748 Full Code [id number]  Gorden Harms, MD Inpatient   06/12/2017 0858 06/16/2017 1659 DNR [id number]  Demetrios Loll, MD Inpatient   05/26/2017 1732 05/30/2017 1446 DNR [id number]  Hillary Bow, MD ED   05/17/2017 1228 05/21/2017 1511 DNR [id number]  Asencion Gowda, NP Inpatient   04/21/2017 0117 04/22/2017 1750 DNR [id number]  Lance Coon, MD Inpatient   04/05/2017 1411 04/08/2017 2101 Full Code [id number]  Saundra Shelling, MD Inpatient   02/08/2016 1414 02/11/2016 1729 Full Code [id number]  Hessie Knows, MD  Inpatient   11/27/2015 1645 11/29/2015 1628 Full Code [id number]  Idelle Crouch, MD Inpatient   02/10/2015 2122 02/12/2015 1809 Full Code [id number]  Lytle Butte, MD ED   09/16/2014 1220 09/19/2014 1736 Full Code [id number]  Aldean Jewett, MD Inpatient      TOTAL TIME TAKING CARE OF THIS PATIENT: 38 minutes.    Note: This dictation was prepared  with Dragon dictation along with smaller phrase technology. Any transcriptional errors that result from this process are unintentional.  Mertha Clyatt M.D on 04/29/2018 at 11:47 AM  Between 7am to 6pm - Pager - [phone] After 6pm go to [email or web address] - password [email or web address] [facility]  Office  [phone]  CC: Primary care physician; Valerie Roys, DO

## 2018-04-29 NOTE — Progress Notes (Signed)
MD notified: Blood pressure soft again 87/76 (MAP 82). Temp 99.0. Pulse 79, Oxygen 98% on 2L. Do you want to give a bolus dose of IV fluids.

## 2018-04-29 NOTE — Progress Notes (Signed)
New referral for outpatient Palliative to follow at [facility] received from [facility]. Plan is for discharge today. Patient information faxed to referral. Flo Shanks BSN, RN, [facility] [phone]

## 2018-04-29 NOTE — Progress Notes (Signed)
  Katherine Lame, MD [facility]   [address], [place], [place] [address] Phone: [phone] Fax : [phone]   Subjective: The patient denies any abdominal pain she also denies any black or bloody stools.  She states she has been doing well overnight.  The patient hemoglobin has been stable.  The patient is presently receiving iron infusions.   Objective: Vital signs in last 24 hours: Vitals:   04/29/18 0900 04/29/18 0954 04/29/18 1139 04/29/18 1214  BP: (!) 90/47 (!) 94/57 (!) 87/76 114/69  Pulse: 82 80 79 80  Resp:   16   Temp:   99 F (37.2 C)   TempSrc:   Oral   SpO2: 98%  98%   Weight:       Weight change:   Intake/Output Summary (Last 24 hours) at 04/29/2018 1416 Last data filed at 04/29/2018 0400 Gross per 24 hour  Intake 1808.2 ml  Output 400 ml  Net 1408.2 ml     Exam: Heart:: Regular rate and rhythm, S1S2 present or without murmur or extra heart sounds Lungs: normal and clear to auscultation and percussion Abdomen: soft, nontender, normal bowel sounds   Lab Results: @LABTEST2 @ Micro Results: No results found for this or any previous visit (from the past 240 hour(s)). Studies/Results: No results found. Medications: I have reviewed the patient's current medications. Scheduled Meds: . acetaminophen  650 mg Oral TID  . atorvastatin  80 mg Oral QPM  . cholecalciferol  1,000 Units Oral Daily  . diltiazem  120 mg Oral BID  . docusate sodium  100 mg Oral BID  . FLUoxetine  40 mg Oral Daily  . gabapentin  300 mg Oral QHS  . hydrALAZINE  25 mg Oral TID  . levETIRAcetam  500 mg Oral Daily  . [START ON 05/02/2018] pantoprazole  40 mg Oral Daily  . [START ON 05/01/2018] pantoprazole  40 mg Intravenous Q12H  . vitamin B-12  1,000 mcg Oral Daily   Continuous Infusions: . sodium chloride 20 mL (04/29/18 1133)  . pantoprozole (PROTONIX) infusion 8 mg/hr (04/29/18 1138)   PRN Meds:.sodium chloride, acetaminophen **OR** acetaminophen, albuterol, bisacodyl,  LORazepam, meclizine, ondansetron **OR** ondansetron (ZOFRAN) IV, traZODone   Assessment: Active Problems:   Hematemesis    Plan: The patient's vomiting has stopped and she denies any abdominal pain nausea vomiting fevers or chills.  She is also not having any diarrhea or any report of rectal bleeding.  Nothing further to do from a GI point of view and the patient should follow-up as an outpatient.   LOS: 0 days   Katherine [name] 04/29/2018, 2:16 PM

## 2018-04-29 NOTE — TOC Transition Note (Signed)
Transition of Care [facility]) - CM/SW Discharge Note   Patient Details  Name: Katherine [name] MRN: [id number] Date of Birth: September 14, 1940  Transition of Care [facility]) CM/SW Contact:  Beverly Sessions, RN Phone Number: 04/29/2018, 12:00 PM   Clinical Narrative:     Patient to return to [facility] today via EMS.  EMS packet place on chart.  Outpatient palliative referral made to [facility] with [facility].     Final next level of care: Skilled Nursing Facility Barriers to Discharge: Barriers Resolved   Patient Goals and CMS Choice Patient states their goals for this hospitalization and ongoing recovery are:: To return to facility       Discharge Placement   Existing PASRR number confirmed : 04/29/18            Patient to be transferred to facility by: EMS Name of family member notified: Mr [name] Patient and family notified of of transfer: 04/29/18  Discharge Plan and Services                        Social Determinants of Health (SDOH) Interventions     Readmission Risk Interventions Readmission Risk Prevention Plan 04/29/2018 04/29/2018 09/22/2017  Transportation Screening Complete Complete Complete  Medication Review [employer]) Complete Complete Complete  PCP or Specialist appointment within 3-5 days of discharge Not Complete - Complete  PCP/Specialist Appt Not Complete comments Patient to return to SNF, and they will make appointments  - -  [place] or [place] (No Data) - Complete  SW Recovery Care/Counseling Consult (No Data) - Not Complete  Palliative Care Screening Complete - Patient refused  Medication Reconcilation (Pharmacy) - - Complete  [place] Complete - Patient refused  Some recent data might be hidden

## 2018-04-30 ENCOUNTER — Ambulatory Visit: Payer: Medicare HMO | Admitting: Family

## 2018-04-30 LAB — ERYTHROPOIETIN: Erythropoietin: 64.7 m[IU]/mL — ABNORMAL HIGH (ref 2.6–18.5)

## 2018-05-03 DIAGNOSIS — D509 Iron deficiency anemia, unspecified: Secondary | ICD-10-CM | POA: Diagnosis not present

## 2018-05-03 DIAGNOSIS — E538 Deficiency of other specified B group vitamins: Secondary | ICD-10-CM | POA: Diagnosis not present

## 2018-05-03 DIAGNOSIS — I509 Heart failure, unspecified: Secondary | ICD-10-CM | POA: Diagnosis not present

## 2018-05-03 DIAGNOSIS — K920 Hematemesis: Secondary | ICD-10-CM | POA: Diagnosis not present

## 2018-05-04 DIAGNOSIS — I5032 Chronic diastolic (congestive) heart failure: Secondary | ICD-10-CM | POA: Diagnosis not present

## 2018-05-04 DIAGNOSIS — G4733 Obstructive sleep apnea (adult) (pediatric): Secondary | ICD-10-CM | POA: Diagnosis not present

## 2018-05-04 DIAGNOSIS — D649 Anemia, unspecified: Secondary | ICD-10-CM | POA: Diagnosis not present

## 2018-05-04 DIAGNOSIS — K920 Hematemesis: Secondary | ICD-10-CM | POA: Diagnosis not present

## 2018-05-05 DIAGNOSIS — J449 Chronic obstructive pulmonary disease, unspecified: Secondary | ICD-10-CM | POA: Diagnosis not present

## 2018-05-07 DIAGNOSIS — N183 Chronic kidney disease, stage 3 (moderate): Secondary | ICD-10-CM | POA: Diagnosis not present

## 2018-05-07 DIAGNOSIS — I509 Heart failure, unspecified: Secondary | ICD-10-CM | POA: Diagnosis not present

## 2018-05-07 DIAGNOSIS — J449 Chronic obstructive pulmonary disease, unspecified: Secondary | ICD-10-CM | POA: Diagnosis not present

## 2018-05-07 DIAGNOSIS — J9611 Chronic respiratory failure with hypoxia: Secondary | ICD-10-CM | POA: Diagnosis not present

## 2018-05-10 ENCOUNTER — Encounter: Payer: Self-pay | Admitting: Nurse Practitioner

## 2018-05-10 ENCOUNTER — Non-Acute Institutional Stay: Payer: Medicare HMO | Admitting: Nurse Practitioner

## 2018-05-10 ENCOUNTER — Other Ambulatory Visit: Payer: Self-pay

## 2018-05-10 VITALS — BP 125/63 | HR 72 | Temp 97.6°F | Resp 18 | Ht 67.0 in | Wt 259.5 lb

## 2018-05-10 DIAGNOSIS — R0602 Shortness of breath: Secondary | ICD-10-CM | POA: Diagnosis not present

## 2018-05-10 DIAGNOSIS — R600 Localized edema: Secondary | ICD-10-CM | POA: Diagnosis not present

## 2018-05-10 DIAGNOSIS — Z515 Encounter for palliative care: Secondary | ICD-10-CM | POA: Insufficient documentation

## 2018-05-10 HISTORY — DX: Encounter for palliative care: Z51.5

## 2018-05-10 HISTORY — DX: Localized edema: R60.0

## 2018-05-10 NOTE — Progress Notes (Signed)
[employer] Palliative Care Consult Note Telephone: [phone]  Fax: [phone]  PATIENT NAME: Katherine [name] DOB: 12-16-1940 MRN: [id number]  PRIMARY CARE PROVIDER:   Valerie Roys DO  REFERRING PROVIDER: Dr Hodges/[facility] RESPONSIBLE PARTY: Responsible party is self / Spero Curb spouse [phone]  I was asked by Dr Nyra Capes to see for Palliative care consult for Katherine [name] and PLAN:  1. Palliative care encounter Z51.5; Palliative medicine team will continue to support patient, patient's family, and medical team. Visit consisted of counseling and education dealing with the complex and emotionally intense issues of symptom management and palliative care in the setting of serious and potentially life-threatening illness  2. Edema R60.9 secondary to CHF, monitor weights, elevate  3. Dyspneic R06.00 secondary to CHF, continue diuresing, inhalation therapy; Continuous O2,  Continue daily weights   ASSESSMENT:   I visited and observe Katherine [name]. We talked about purpose for palliative care visit and consent obtained. We talked about how she was feeling today. She shared that she's doing a little better just getting over the flu but still doesn't feel great. We talked about symptoms of pain what she denies. We talked about shortness of breath what she does receive oxygen and inhalation therapy which is beneficial. We talked about her appetite. Katherine [name] endorses she currently is not receiving physical therapy at this time. We talked about her functional level. We talked about past medical history in the [place] of chronic disease and recent hospitalizations. We talked about Life review. She lives with her husband and they do not have children. We talked about progression of chronic disease. We talked about medical goals of care. We talked about aggressive versus conservative versus Comfort Care. We talked about code status as she  currently is a full code. Her wishes are to continue to be a full code with aggressive interventions including ICU admission, hospitalization, intubation with ventilator if necessary. We talked about role of palliative care and plan of care. Her wishes already continued palliative care and will revisit in 2 weeks if need it or sooner should she declined. Questions answered the satisfaction. Asked if it was okay to contact her husband and she requested that she will update him about the visit. I have updated nursing staff. No new changes to current goals or plan of care  3 / 15/ 2020 sodium 141, potassium 4.5, chloride 109, Co2 27, calcium 8.8, bun 11, Creatinine 1.39, glucose 97, WBC 5.0, hemoglobin 7.8, hematocrit 25, platelets 191  3 / 16 / 2020 WBC 5.3, hemoglobin 8.2, hematocrit 26.4, platelets 176  BMI 40.6 2 /03/2018 weight 258.0 lbs 3 /27/2020 weight 259.5 lbs  I spent 75 minutes providing this consultation,  from 12:30pm to1:45pm. More than 50% of the time in this consultation was spent coordinating communication.   HISTORY OF PRESENT ILLNESS:  Katherine [name] is a 78 y.o. 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 Hospitalized 9/28/ 2019 for angioedema with altered mental status and acute COPD exacerbation. Acute toxic metabolic encephalopathy likely secondary to COPD and acute on chronic hypoxic  respiratory failure. Lower extremity edema  with right sided heart failure secondary from end-stage lung disease COPD. Hospitalize 10 / 9 / 2019 to 10 / 14 / 2019 for acute kidney injury with acute on chronic COPD exacerbation with chronic kidney disease stage 3 and tonic respiratory failure with chronic oxygen requirement. UTI received antibiotics their cultures negative. Bilateral leg pain cyst that chronic neuropathy. Acute kidney injury over chronic kidney disease improve with fluids. Hospitalized 10 / 17 / 2019 210 / 23 / 2019 for acute on chronic respiratory failure with hypoxia secondary to COPD exacerbation with chest x-ray showing no pneumonia that was treated with IV steroids, duoneb and pulmicort which improved. She was hyperglycemic secondary to steroids. Hospitalized 11 / 12 / 2019 to 77 / 13 / 2019 for epistaxis, hemoptysis with shortness of breath concern for acute pulmonary embolism CT chest reveals no PE, infiltrate or edema. She was started on Eliquis for bilateral lower extremity edema and acute COPD exacerbation started prednisone with inhalation therapy. Hospitalized 1 / 12 / 2020 to 1/16 / 2020 for pneumonia requiring BiPAP with acute on chronic respiratory failure secondary to acute on chronic COPD exacerbation requiring steroids, antibiotic therapy. Chronic hypoxic respiratory failure on continuous O2 at 2 at home with a history of compensated CHF with oral Lasix. Hospitalized 2 / 29 / 2020 for shortness of breath with influenza A treated with Tamiflu. Acute probable cystitis treated empirically with Rocephin with acute on chronic COPD exacerbation secondary to influenza A which resolved. Back at base line with continuous O2 at 2 L. Diabetes stable. Chronic kidney disease stage 4. Chronic diastolic congestive heart failure without exacerbation. She is on B12 supplements, ferritin for chronic B12 deficiency and iron deficiency anemia. 3 / 15 / 2020 to 3 / 16 / 2020 hospitalized due to hematemesis with no further episodes during  hospitalization. Hemoglobin 8.5 and remained relatively stable. Plavix if remains stable to be restarted. Hypertension with blood pressures fluctuating. She does remaining full code. This trend continues to reside in skilled [place]. She requires assistance with Mobility, adl's. She is able to feed herself per staff and appetite has been fair to poor. She remains on continuous oxygen. At present she is lying in bed, appears chronically ill, but comfortable. No visitors present. Palliative Care was asked to help address goals of care.   CODE STATUS: Full code  PPS: 40% HOSPICE ELIGIBILITY/DIAGNOSIS: TBD  PAST MEDICAL HISTORY:  Past Medical History:  Diagnosis Date   Angioedema    Anxiety    Anxiety and depression    CHF (congestive heart failure) (HCC)    CHF (congestive heart failure) (HCC)    Chronic constipation    Chronic kidney disease    stage 3   COPD (chronic obstructive pulmonary disease) (HCC)    Diabetes mellitus without complication (HCC)    Gallstones    GERD (gastroesophageal reflux disease)    Hypertension    Left ventricular hypertrophy    Osteoarthritis    Prurigo nodularis    Seizures (HCC)    Stroke (HCC)    Tobacco abuse    Vitamin D deficiency disease     SOCIAL HX:  Social History   Tobacco Use   Smoking status: Former Smoker    Packs/day: 0.50    Years: 60.00    Pack years: 30.00    Types: Cigarettes   Smokeless tobacco: Never Used   Tobacco comment: quite 80mo ago-03/26/18  Substance Use Topics   Alcohol  use: No    Alcohol/week: 0.0 standard drinks    Comment: rare    ALLERGIES:  Allergies  Allergen Reactions   Bee Venom Swelling   Enalapril Maleate Swelling     PERTINENT MEDICATIONS:  Outpatient Encounter Medications as of 05/10/2018  Medication Sig   acetaminophen (TYLENOL) 650 MG CR tablet Take 650 mg by mouth 3 (three) times daily.    albuterol (ACCUNEB) 0.63 MG/3ML nebulizer solution  Take 3 mLs by nebulization every 8 (eight) hours as needed for wheezing.    albuterol (PROVENTIL HFA;VENTOLIN HFA) 108 (90 Base) MCG/ACT inhaler Inhale 2 puffs into the lungs every 4 (four) hours as needed for wheezing or shortness of breath.   atorvastatin (LIPITOR) 80 MG tablet Take 80 mg by mouth every evening.    cetirizine (ZYRTEC) 10 MG tablet Take 5 mg by mouth at bedtime.   cholecalciferol (VITAMIN D) 1000 units tablet Take 1,000 Units by mouth daily.   clopidogrel (PLAVIX) 75 MG tablet Take 75 mg by mouth daily.   diclofenac sodium (VOLTAREN) 1 % GEL Apply 2 g topically every 12 (twelve) hours as needed (for left knee pain).    FLUoxetine (PROZAC) 40 MG capsule Take 40 mg by mouth daily.    Fluticasone-Umeclidin-Vilant 100-62.5-25 MCG/INH AEPB Inhale 1 puff into the lungs daily.   gabapentin (NEURONTIN) 300 MG capsule Take 300 mg by mouth at bedtime.   ipratropium (ATROVENT) 0.03 % nasal spray Place 2 sprays into both nostrils 2 (two) times daily.    ipratropium-albuterol (DUONEB) 0.5-2.5 (3) MG/3ML SOLN Take 3 mLs by nebulization 2 (two) times daily.    levETIRAcetam (KEPPRA XR) 500 MG 24 hr tablet Take 1 tablet (500 mg total) by mouth daily.   linagliptin (TRADJENTA) 5 MG TABS tablet Take 5 mg by mouth daily.   LORazepam (ATIVAN) 2 MG/ML injection Inject 0.5 mLs (1 mg total) into the muscle as needed for seizure. (Patient taking differently: Inject 1 mg into the muscle daily as needed for seizure. )   meclizine (ANTIVERT) 25 MG tablet Take 25 mg by mouth every 12 (twelve) hours as needed for dizziness.    Multiple Vitamin (MULTIVITAMIN WITH MINERALS) TABS tablet Take 1 tablet by mouth daily.   pantoprazole (PROTONIX) 40 MG tablet Take 1 tablet (40 mg total) by mouth daily.   vitamin B-12 1000 MCG tablet Take 1 tablet (1,000 mcg total) by mouth daily.   No facility-administered encounter medications on file as of 05/10/2018.     PHYSICAL EXAM:   General: obese,  chronically ill, debilitated female Cardiovascular: regular rate and rhythm Pulmonary: clear ant fields Abdomen: soft, nontender, + bowel sounds GU: no suprapubic tenderness Extremities: + BLE edema, no joint deformities Skin: no rashes Neurological: Weakness but otherwise nonfocal  Jabe Jeanbaptiste Ihor Gully, NP

## 2018-05-13 DIAGNOSIS — I10 Essential (primary) hypertension: Secondary | ICD-10-CM | POA: Diagnosis not present

## 2018-05-13 DIAGNOSIS — G4733 Obstructive sleep apnea (adult) (pediatric): Secondary | ICD-10-CM | POA: Diagnosis not present

## 2018-05-13 DIAGNOSIS — J449 Chronic obstructive pulmonary disease, unspecified: Secondary | ICD-10-CM | POA: Diagnosis not present

## 2018-05-13 DIAGNOSIS — A411 Sepsis due to other specified staphylococcus: Secondary | ICD-10-CM | POA: Diagnosis not present

## 2018-05-13 DIAGNOSIS — D649 Anemia, unspecified: Secondary | ICD-10-CM | POA: Diagnosis not present

## 2018-05-14 DIAGNOSIS — K920 Hematemesis: Secondary | ICD-10-CM | POA: Diagnosis not present

## 2018-05-14 DIAGNOSIS — D649 Anemia, unspecified: Secondary | ICD-10-CM | POA: Diagnosis not present

## 2018-05-15 DIAGNOSIS — R079 Chest pain, unspecified: Secondary | ICD-10-CM | POA: Diagnosis not present

## 2018-05-15 DIAGNOSIS — R05 Cough: Secondary | ICD-10-CM | POA: Diagnosis not present

## 2018-05-23 DIAGNOSIS — R509 Fever, unspecified: Secondary | ICD-10-CM | POA: Diagnosis not present

## 2018-05-23 DIAGNOSIS — R1110 Vomiting, unspecified: Secondary | ICD-10-CM | POA: Diagnosis not present

## 2018-05-23 DIAGNOSIS — R05 Cough: Secondary | ICD-10-CM | POA: Diagnosis not present

## 2018-05-24 DIAGNOSIS — B957 Other staphylococcus as the cause of diseases classified elsewhere: Secondary | ICD-10-CM | POA: Diagnosis not present

## 2018-05-24 DIAGNOSIS — E1165 Type 2 diabetes mellitus with hyperglycemia: Secondary | ICD-10-CM | POA: Diagnosis not present

## 2018-05-24 DIAGNOSIS — Z139 Encounter for screening, unspecified: Secondary | ICD-10-CM | POA: Diagnosis not present

## 2018-05-24 DIAGNOSIS — I639 Cerebral infarction, unspecified: Secondary | ICD-10-CM | POA: Diagnosis not present

## 2018-05-24 DIAGNOSIS — R319 Hematuria, unspecified: Secondary | ICD-10-CM | POA: Diagnosis not present

## 2018-05-24 DIAGNOSIS — N390 Urinary tract infection, site not specified: Secondary | ICD-10-CM | POA: Diagnosis not present

## 2018-05-24 DIAGNOSIS — Z1331 Encounter for screening for depression: Secondary | ICD-10-CM | POA: Diagnosis not present

## 2018-05-24 DIAGNOSIS — J9621 Acute and chronic respiratory failure with hypoxia: Secondary | ICD-10-CM | POA: Diagnosis not present

## 2018-05-24 DIAGNOSIS — Z0000 Encounter for general adult medical examination without abnormal findings: Secondary | ICD-10-CM | POA: Diagnosis not present

## 2018-05-24 DIAGNOSIS — D649 Anemia, unspecified: Secondary | ICD-10-CM | POA: Diagnosis not present

## 2018-05-27 DIAGNOSIS — J449 Chronic obstructive pulmonary disease, unspecified: Secondary | ICD-10-CM | POA: Diagnosis not present

## 2018-05-27 DIAGNOSIS — E1322 Other specified diabetes mellitus with diabetic chronic kidney disease: Secondary | ICD-10-CM | POA: Diagnosis not present

## 2018-05-27 DIAGNOSIS — N183 Chronic kidney disease, stage 3 (moderate): Secondary | ICD-10-CM | POA: Diagnosis not present

## 2018-05-27 DIAGNOSIS — I2510 Atherosclerotic heart disease of native coronary artery without angina pectoris: Secondary | ICD-10-CM | POA: Diagnosis not present

## 2018-06-04 DIAGNOSIS — E1322 Other specified diabetes mellitus with diabetic chronic kidney disease: Secondary | ICD-10-CM | POA: Diagnosis not present

## 2018-06-04 DIAGNOSIS — J449 Chronic obstructive pulmonary disease, unspecified: Secondary | ICD-10-CM | POA: Diagnosis not present

## 2018-06-04 DIAGNOSIS — I509 Heart failure, unspecified: Secondary | ICD-10-CM | POA: Diagnosis not present

## 2018-06-04 DIAGNOSIS — I129 Hypertensive chronic kidney disease with stage 1 through stage 4 chronic kidney disease, or unspecified chronic kidney disease: Secondary | ICD-10-CM | POA: Diagnosis not present

## 2018-06-05 DIAGNOSIS — J449 Chronic obstructive pulmonary disease, unspecified: Secondary | ICD-10-CM | POA: Diagnosis not present

## 2018-06-08 IMAGING — CR DG CHEST 2V
1 series · 2 of 2 positions shown · non-contrast
Comparison: Chest x-ray dated 06/21/2015.

CLINICAL DATA: Cough with shortness of breath.

EXAM:
CHEST  2 VIEW

[Series 1: dg chest 2 view · 0.14mm/px · 2 of 2 slices shown]
[im 1/2]
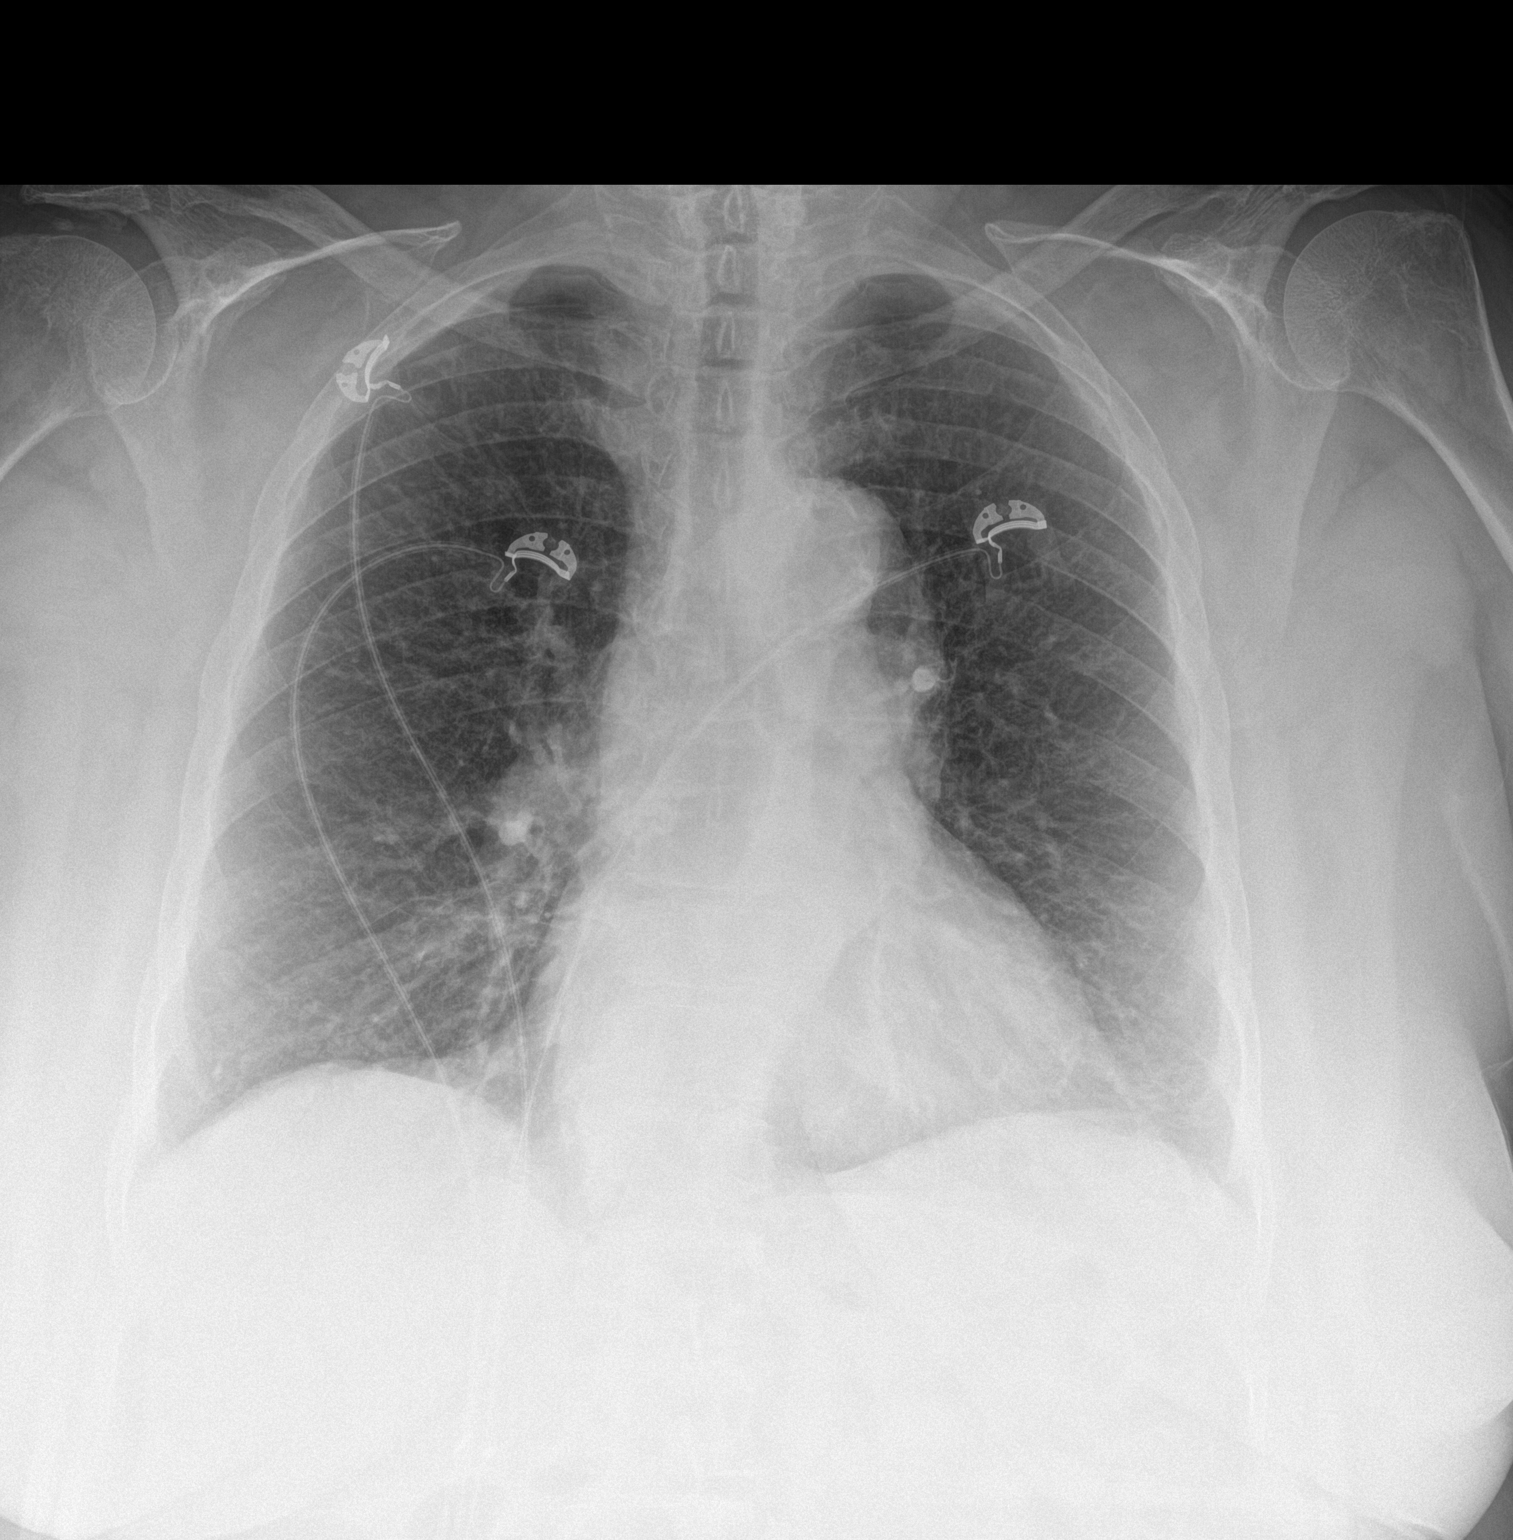
[im 2/2]
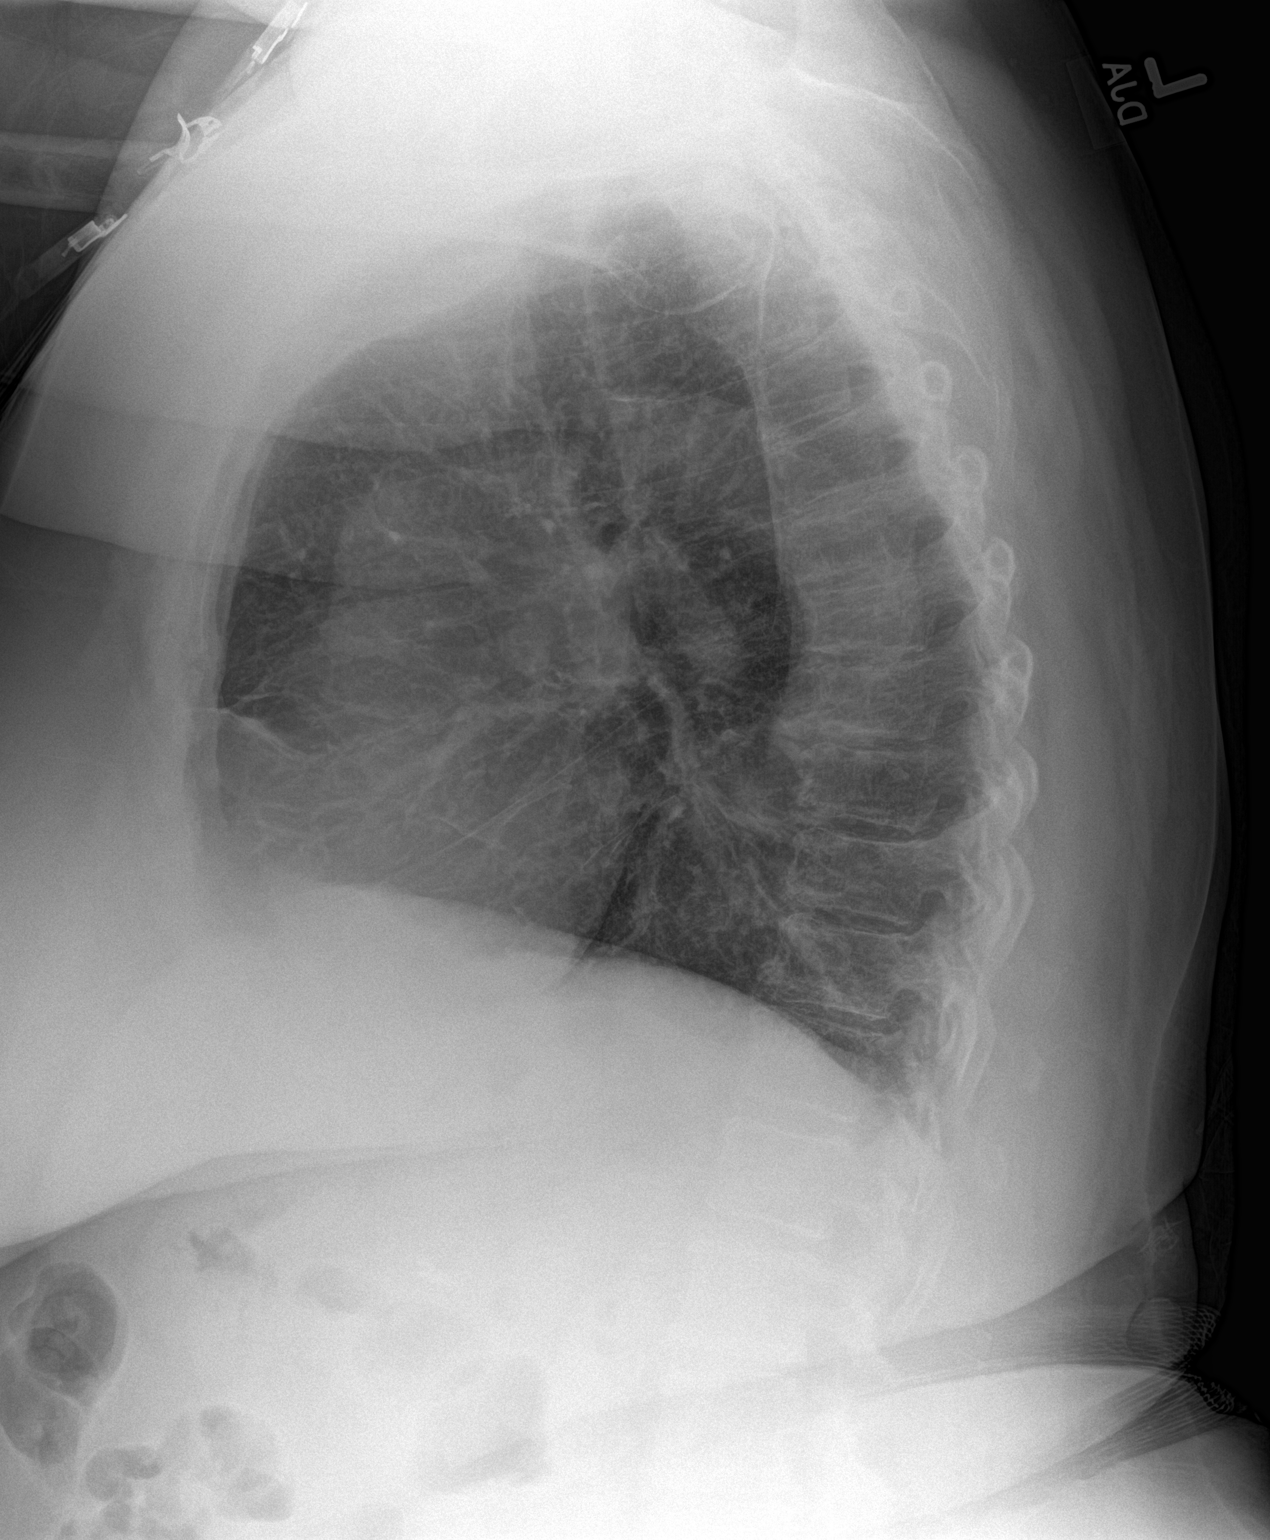

[2 of 2 positions shown; findings below may reference images not displayed]

FINDINGS: Heart size is normal. Overall cardiomediastinal silhouette is
stable. Atherosclerotic changes again noted at the aortic arch.

Mildly prominent interstitial markings are unchanged, presumably
chronic. Lungs otherwise clear. No evidence of pneumonia. No pleural
effusion or pneumothorax seen.

Mild degenerative spurring noted within the thoracic spine. No acute
or suspicious osseous finding.
IMPRESSION: No active cardiopulmonary disease.  No evidence of pneumonia.

Aortic atherosclerosis.

## 2018-06-09 IMAGING — CR DG CHEST 2V
2 series · 3 of 3 positions shown · non-contrast
Comparison: 11/27/2015

CLINICAL DATA: Shortness of breath and productive cough.

EXAM:
CHEST  2 VIEW

[Series 2: chest lat · 0.14mm/px · 2 of 2 slices shown]
[im 1/2]
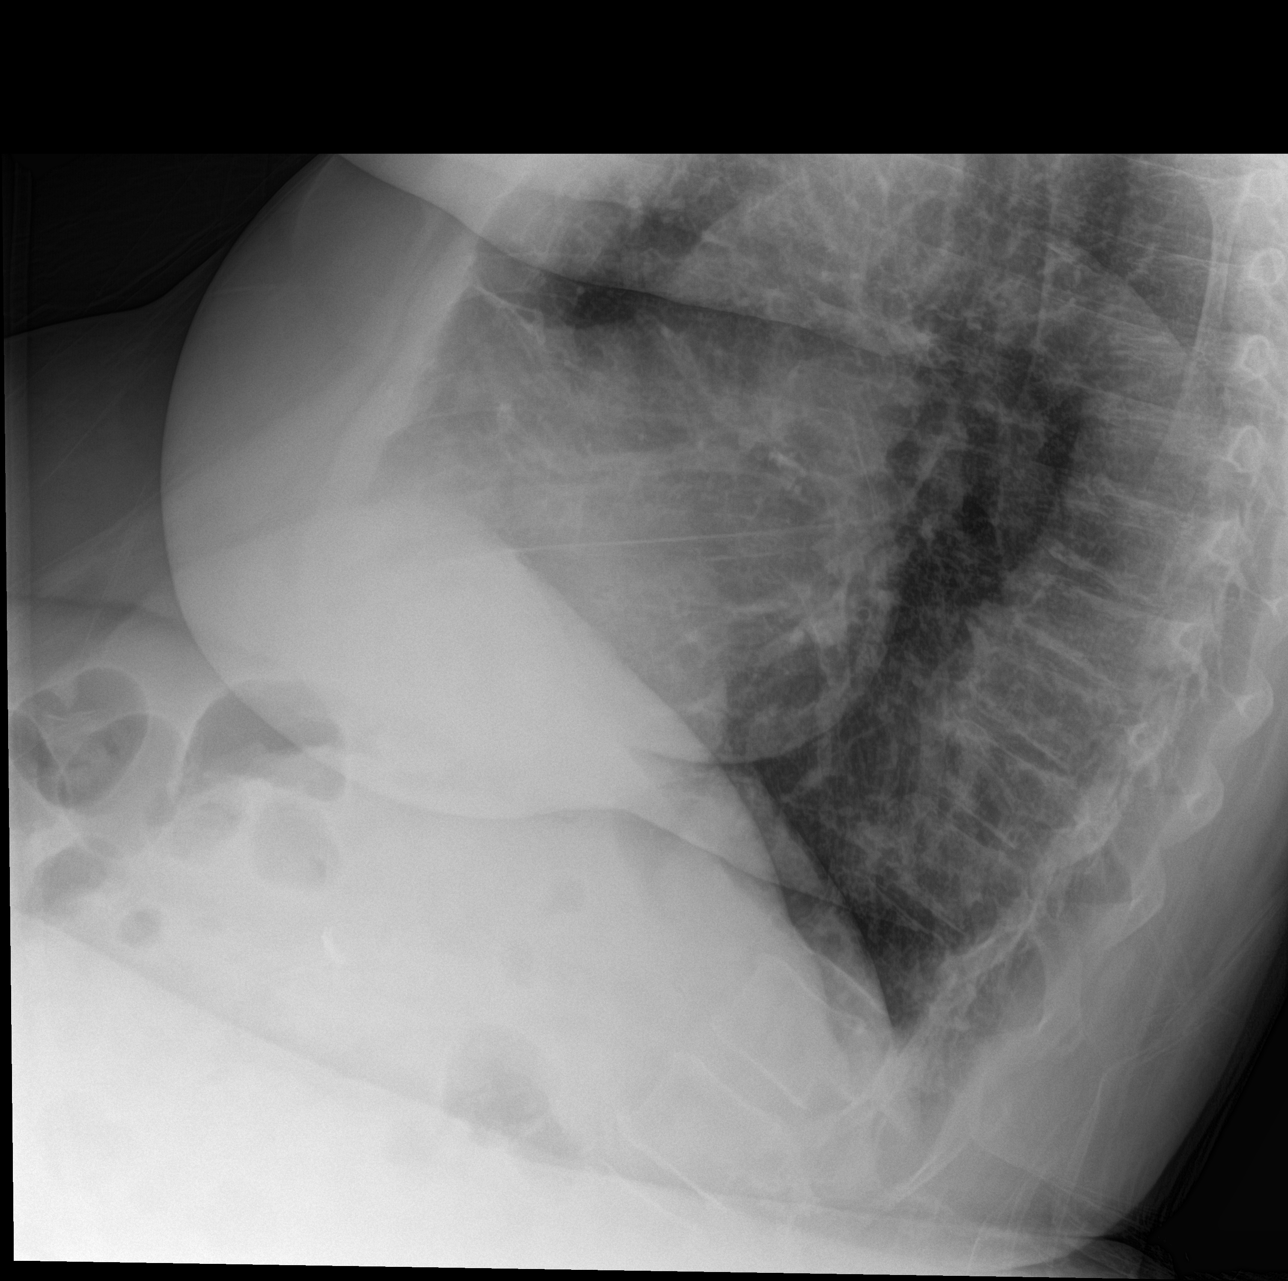
[im 2/2]
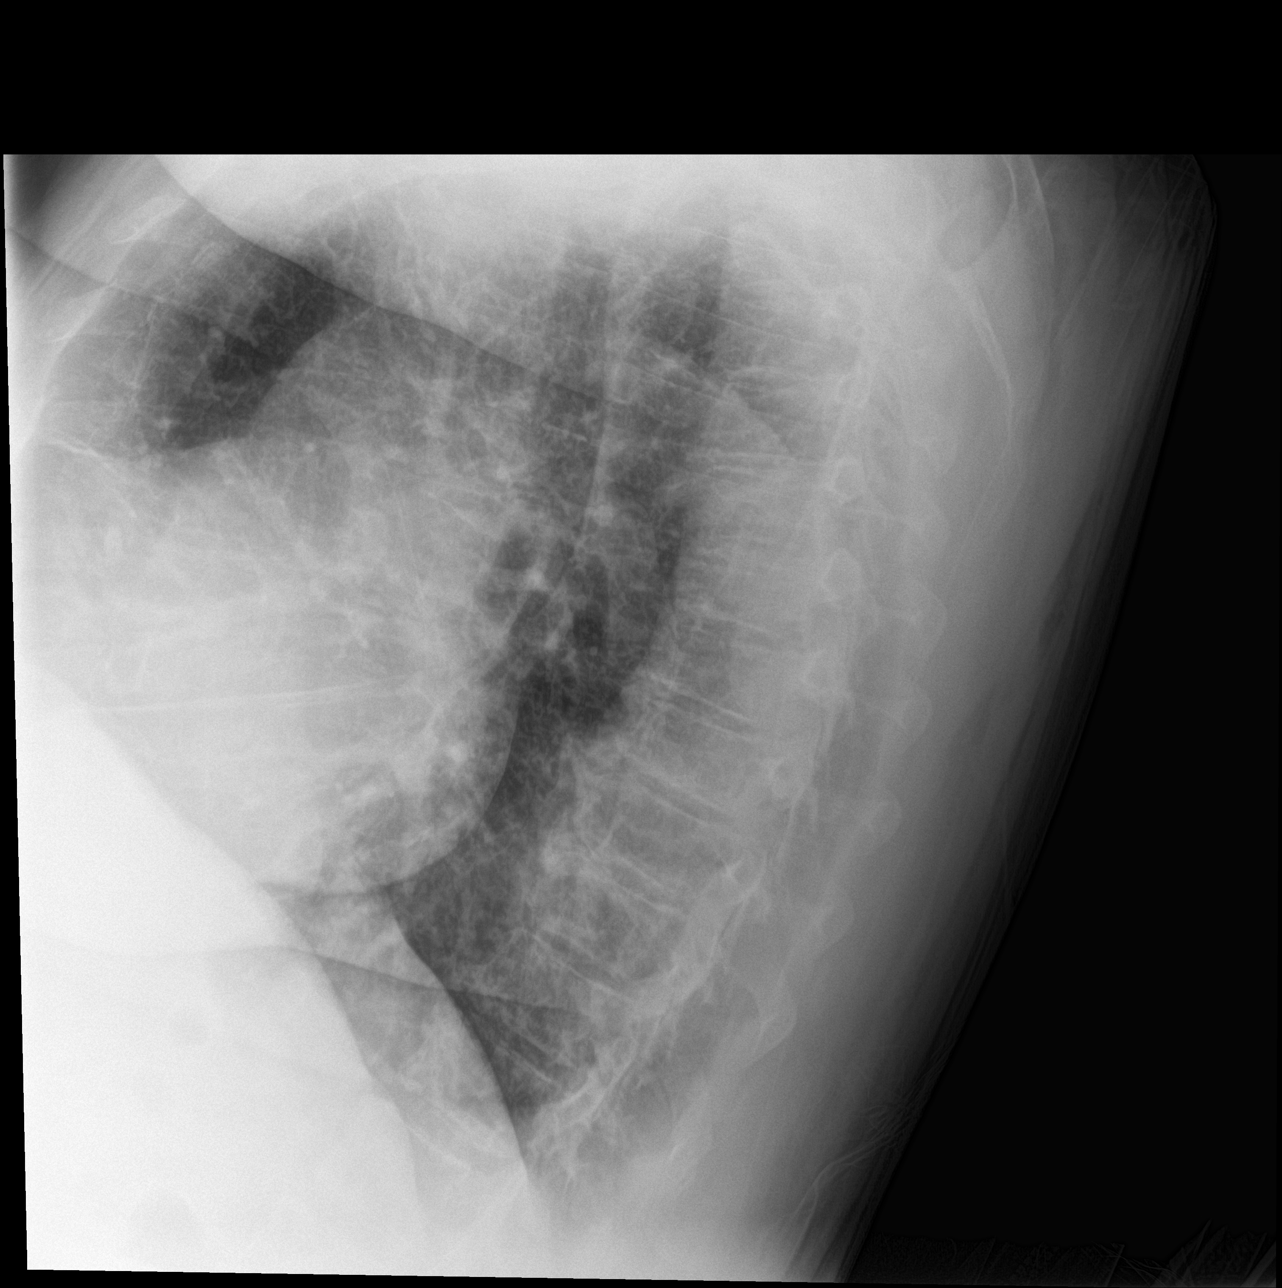

[chest ap]
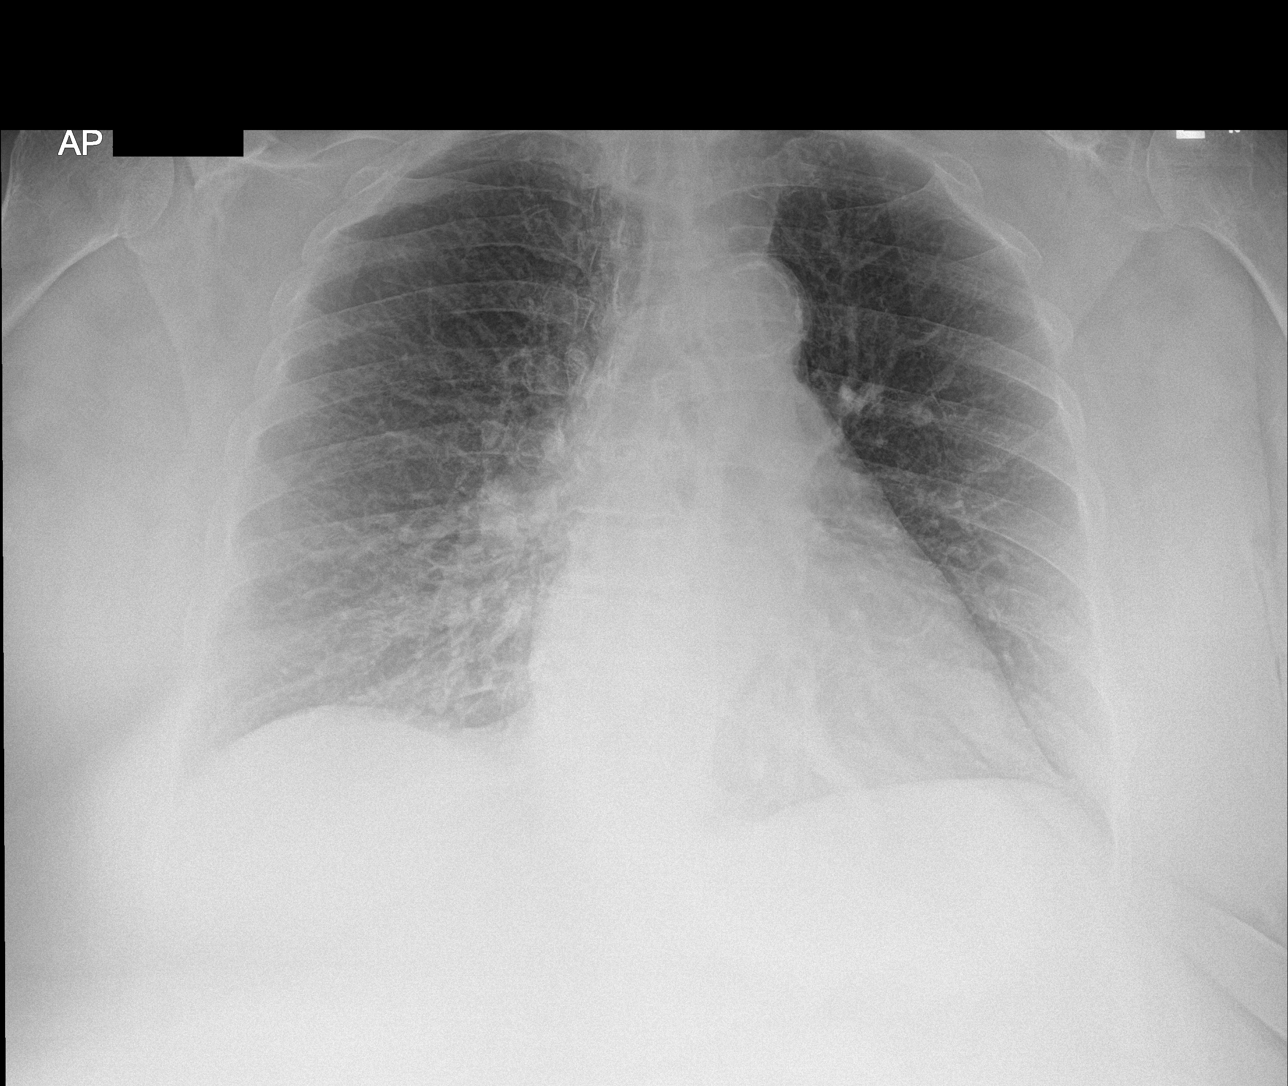

[3 of 3 positions shown; findings below may reference images not displayed]

FINDINGS: The cardio pericardial silhouette is enlarged. The lungs are clear
wiithout focal pneumonia, edema, pneumothorax or pleural effusion.
Interstitial markings are diffusely coarsened with chronic features.
The visualized bony structures of the thorax are intact.
IMPRESSION: Stable.  Cardiomegaly with chronic interstitial changes.

## 2018-06-10 ENCOUNTER — Inpatient Hospital Stay
Admission: EM | Admit: 2018-06-10 | Discharge: 2018-06-13 | DRG: 871 | Disposition: A | Discharge status: Skilled Nursing Facility | Payer: Medicare HMO | Attending: Internal Medicine | Admitting: Internal Medicine

## 2018-06-10 ENCOUNTER — Encounter: Payer: Self-pay | Admitting: Emergency Medicine

## 2018-06-10 ENCOUNTER — Other Ambulatory Visit: Payer: Self-pay

## 2018-06-10 ENCOUNTER — Emergency Department: Payer: Medicare HMO

## 2018-06-10 DIAGNOSIS — Z96651 Presence of right artificial knee joint: Secondary | ICD-10-CM | POA: Diagnosis present

## 2018-06-10 DIAGNOSIS — N3001 Acute cystitis with hematuria: Secondary | ICD-10-CM | POA: Diagnosis present

## 2018-06-10 DIAGNOSIS — A419 Sepsis, unspecified organism: Secondary | ICD-10-CM | POA: Diagnosis not present

## 2018-06-10 DIAGNOSIS — B379 Candidiasis, unspecified: Secondary | ICD-10-CM | POA: Diagnosis present

## 2018-06-10 DIAGNOSIS — J441 Chronic obstructive pulmonary disease with (acute) exacerbation: Secondary | ICD-10-CM | POA: Diagnosis not present

## 2018-06-10 DIAGNOSIS — R4020 Unspecified coma: Secondary | ICD-10-CM | POA: Diagnosis not present

## 2018-06-10 DIAGNOSIS — E1122 Type 2 diabetes mellitus with diabetic chronic kidney disease: Secondary | ICD-10-CM | POA: Diagnosis present

## 2018-06-10 DIAGNOSIS — N183 Chronic kidney disease, stage 3 (moderate): Secondary | ICD-10-CM | POA: Diagnosis present

## 2018-06-10 DIAGNOSIS — J189 Pneumonia, unspecified organism: Secondary | ICD-10-CM | POA: Diagnosis present

## 2018-06-10 DIAGNOSIS — G9341 Metabolic encephalopathy: Secondary | ICD-10-CM | POA: Diagnosis not present

## 2018-06-10 DIAGNOSIS — Z91030 Bee allergy status: Secondary | ICD-10-CM

## 2018-06-10 DIAGNOSIS — Z8673 Personal history of transient ischemic attack (TIA), and cerebral infarction without residual deficits: Secondary | ICD-10-CM | POA: Diagnosis not present

## 2018-06-10 DIAGNOSIS — J9621 Acute and chronic respiratory failure with hypoxia: Secondary | ICD-10-CM | POA: Diagnosis not present

## 2018-06-10 DIAGNOSIS — Z9049 Acquired absence of other specified parts of digestive tract: Secondary | ICD-10-CM

## 2018-06-10 DIAGNOSIS — K219 Gastro-esophageal reflux disease without esophagitis: Secondary | ICD-10-CM | POA: Diagnosis present

## 2018-06-10 DIAGNOSIS — W19XXXA Unspecified fall, initial encounter: Secondary | ICD-10-CM | POA: Diagnosis not present

## 2018-06-10 DIAGNOSIS — J440 Chronic obstructive pulmonary disease with acute lower respiratory infection: Secondary | ICD-10-CM | POA: Diagnosis present

## 2018-06-10 DIAGNOSIS — I509 Heart failure, unspecified: Secondary | ICD-10-CM | POA: Diagnosis present

## 2018-06-10 DIAGNOSIS — Z6841 Body Mass Index (BMI) 40.0 and over, adult: Secondary | ICD-10-CM | POA: Diagnosis not present

## 2018-06-10 DIAGNOSIS — G4089 Other seizures: Secondary | ICD-10-CM | POA: Diagnosis not present

## 2018-06-10 DIAGNOSIS — D638 Anemia in other chronic diseases classified elsewhere: Secondary | ICD-10-CM | POA: Diagnosis not present

## 2018-06-10 DIAGNOSIS — K5909 Other constipation: Secondary | ICD-10-CM | POA: Diagnosis present

## 2018-06-10 DIAGNOSIS — J9601 Acute respiratory failure with hypoxia: Secondary | ICD-10-CM | POA: Diagnosis present

## 2018-06-10 DIAGNOSIS — M109 Gout, unspecified: Secondary | ICD-10-CM | POA: Diagnosis not present

## 2018-06-10 DIAGNOSIS — F329 Major depressive disorder, single episode, unspecified: Secondary | ICD-10-CM | POA: Diagnosis present

## 2018-06-10 DIAGNOSIS — Z87891 Personal history of nicotine dependence: Secondary | ICD-10-CM

## 2018-06-10 DIAGNOSIS — Z7902 Long term (current) use of antithrombotics/antiplatelets: Secondary | ICD-10-CM

## 2018-06-10 DIAGNOSIS — Z8249 Family history of ischemic heart disease and other diseases of the circulatory system: Secondary | ICD-10-CM

## 2018-06-10 DIAGNOSIS — Z7401 Bed confinement status: Secondary | ICD-10-CM | POA: Diagnosis not present

## 2018-06-10 DIAGNOSIS — I130 Hypertensive heart and chronic kidney disease with heart failure and stage 1 through stage 4 chronic kidney disease, or unspecified chronic kidney disease: Secondary | ICD-10-CM | POA: Diagnosis not present

## 2018-06-10 DIAGNOSIS — R6520 Severe sepsis without septic shock: Secondary | ICD-10-CM | POA: Diagnosis present

## 2018-06-10 DIAGNOSIS — F419 Anxiety disorder, unspecified: Secondary | ICD-10-CM | POA: Diagnosis present

## 2018-06-10 DIAGNOSIS — R4182 Altered mental status, unspecified: Secondary | ICD-10-CM | POA: Diagnosis not present

## 2018-06-10 DIAGNOSIS — Z832 Family history of diseases of the blood and blood-forming organs and certain disorders involving the immune mechanism: Secondary | ICD-10-CM

## 2018-06-10 DIAGNOSIS — Z20828 Contact with and (suspected) exposure to other viral communicable diseases: Secondary | ICD-10-CM | POA: Diagnosis not present

## 2018-06-10 DIAGNOSIS — Z811 Family history of alcohol abuse and dependence: Secondary | ICD-10-CM

## 2018-06-10 DIAGNOSIS — R7989 Other specified abnormal findings of blood chemistry: Secondary | ICD-10-CM | POA: Diagnosis not present

## 2018-06-10 DIAGNOSIS — M6281 Muscle weakness (generalized): Secondary | ICD-10-CM | POA: Diagnosis not present

## 2018-06-10 DIAGNOSIS — R531 Weakness: Secondary | ICD-10-CM | POA: Diagnosis not present

## 2018-06-10 DIAGNOSIS — Z03818 Encounter for observation for suspected exposure to other biological agents ruled out: Secondary | ICD-10-CM | POA: Diagnosis not present

## 2018-06-10 DIAGNOSIS — J449 Chronic obstructive pulmonary disease, unspecified: Secondary | ICD-10-CM | POA: Diagnosis not present

## 2018-06-10 DIAGNOSIS — Z888 Allergy status to other drugs, medicaments and biological substances status: Secondary | ICD-10-CM

## 2018-06-10 DIAGNOSIS — E1165 Type 2 diabetes mellitus with hyperglycemia: Secondary | ICD-10-CM | POA: Diagnosis not present

## 2018-06-10 DIAGNOSIS — Z79899 Other long term (current) drug therapy: Secondary | ICD-10-CM

## 2018-06-10 DIAGNOSIS — R918 Other nonspecific abnormal finding of lung field: Secondary | ICD-10-CM | POA: Diagnosis not present

## 2018-06-10 DIAGNOSIS — I5032 Chronic diastolic (congestive) heart failure: Secondary | ICD-10-CM | POA: Diagnosis not present

## 2018-06-10 DIAGNOSIS — Z8669 Personal history of other diseases of the nervous system and sense organs: Secondary | ICD-10-CM

## 2018-06-10 DIAGNOSIS — N390 Urinary tract infection, site not specified: Secondary | ICD-10-CM | POA: Diagnosis not present

## 2018-06-10 DIAGNOSIS — R0602 Shortness of breath: Secondary | ICD-10-CM

## 2018-06-10 DIAGNOSIS — I2510 Atherosclerotic heart disease of native coronary artery without angina pectoris: Secondary | ICD-10-CM | POA: Diagnosis not present

## 2018-06-10 DIAGNOSIS — M2550 Pain in unspecified joint: Secondary | ICD-10-CM | POA: Diagnosis not present

## 2018-06-10 DIAGNOSIS — R748 Abnormal levels of other serum enzymes: Secondary | ICD-10-CM

## 2018-06-10 DIAGNOSIS — G4733 Obstructive sleep apnea (adult) (pediatric): Secondary | ICD-10-CM | POA: Diagnosis not present

## 2018-06-10 LAB — CBC WITH DIFFERENTIAL/PLATELET
Abs Immature Granulocytes: 0.03 10*3/uL (ref 0.00–0.07)
Basophils Absolute: 0 10*3/uL (ref 0.0–0.1)
Basophils Relative: 0 %
Eosinophils Absolute: 0.4 10*3/uL (ref 0.0–0.5)
Eosinophils Relative: 8 %
HCT: 31.5 % — ABNORMAL LOW (ref 36.0–46.0)
Hemoglobin: 9.8 g/dL — ABNORMAL LOW (ref 12.0–15.0)
Immature Granulocytes: 1 %
Lymphocytes Relative: 21 %
Lymphs Abs: 1.1 10*3/uL (ref 0.7–4.0)
MCH: 26.9 pg (ref 26.0–34.0)
MCHC: 31.1 g/dL (ref 30.0–36.0)
MCV: 86.5 fL (ref 80.0–100.0)
Monocytes Absolute: 0.5 10*3/uL (ref 0.1–1.0)
Monocytes Relative: 10 %
Neutro Abs: 3.1 10*3/uL (ref 1.7–7.7)
Neutrophils Relative %: 60 %
Platelets: 330 10*3/uL (ref 150–400)
RBC: 3.64 MIL/uL — ABNORMAL LOW (ref 3.87–5.11)
RDW: 17.2 % — ABNORMAL HIGH (ref 11.5–15.5)
WBC: 5.1 10*3/uL (ref 4.0–10.5)
nRBC: 0 % (ref 0.0–0.2)

## 2018-06-10 LAB — URINALYSIS, COMPLETE (UACMP) WITH MICROSCOPIC
Bilirubin Urine: NEGATIVE
Glucose, UA: NEGATIVE mg/dL
Ketones, ur: NEGATIVE mg/dL
Nitrite: NEGATIVE
Protein, ur: NEGATIVE mg/dL
RBC / HPF: >50 RBC/hpf — ABNORMAL HIGH (ref 0–5)
Specific Gravity, Urine: 1.015 (ref 1.005–1.030)
WBC, UA: >50 WBC/hpf — ABNORMAL HIGH (ref 0–5)
pH: 5 (ref 5.0–8.0)

## 2018-06-10 LAB — COMPREHENSIVE METABOLIC PANEL
ALT: 65 U/L — ABNORMAL HIGH (ref 0–44)
AST: 121 U/L — ABNORMAL HIGH (ref 15–41)
Albumin: 2.6 g/dL — ABNORMAL LOW (ref 3.5–5.0)
Alkaline Phosphatase: 500 U/L — ABNORMAL HIGH (ref 38–126)
Anion gap: 11 (ref 5–15)
BUN: 23 mg/dL (ref 8–23)
CO2: 26 mmol/L (ref 22–32)
Calcium: 9.9 mg/dL (ref 8.9–10.3)
Chloride: 101 mmol/L (ref 98–111)
Creatinine, Ser: 1.57 mg/dL — ABNORMAL HIGH (ref 0.44–1.00)
GFR calc Af Amer: 37 mL/min — ABNORMAL LOW (ref >60)
GFR calc non Af Amer: 32 mL/min — ABNORMAL LOW (ref >60)
Glucose, Bld: 100 mg/dL — ABNORMAL HIGH (ref 70–99)
Potassium: 4.5 mmol/L (ref 3.5–5.1)
Sodium: 138 mmol/L (ref 135–145)
Total Bilirubin: 0.6 mg/dL (ref 0.3–1.2)
Total Protein: 7.3 g/dL (ref 6.5–8.1)

## 2018-06-10 LAB — URINE DRUG SCREEN, QUALITATIVE (ARMC ONLY)
Amphetamines, Ur Screen: NOT DETECTED
Barbiturates, Ur Screen: NOT DETECTED
Benzodiazepine, Ur Scrn: NOT DETECTED
Cannabinoid 50 Ng, Ur ~~LOC~~: NOT DETECTED
Cocaine Metabolite,Ur ~~LOC~~: NOT DETECTED
MDMA (Ecstasy)Ur Screen: NOT DETECTED
Methadone Scn, Ur: NOT DETECTED
Opiate, Ur Screen: NOT DETECTED
Phencyclidine (PCP) Ur S: NOT DETECTED
Tricyclic, Ur Screen: NOT DETECTED

## 2018-06-10 LAB — BLOOD GAS, VENOUS
Acid-Base Excess: 3.2 mmol/L — ABNORMAL HIGH (ref 0.0–2.0)
Bicarbonate: 29.9 mmol/L — ABNORMAL HIGH (ref 20.0–28.0)
O2 Saturation: 90.8 %
Patient temperature: 37
pCO2, Ven: 53 mmHg (ref 44.0–60.0)
pH, Ven: 7.36 (ref 7.250–7.430)
pO2, Ven: 63 mmHg — ABNORMAL HIGH (ref 32.0–45.0)

## 2018-06-10 LAB — AMMONIA: Ammonia: 20 umol/L (ref 9–35)

## 2018-06-10 LAB — LACTIC ACID, PLASMA
Lactic Acid, Venous: 1 mmol/L (ref 0.5–1.9)
Lactic Acid, Venous: 0.9 mmol/L (ref 0.5–1.9)

## 2018-06-10 LAB — PROCALCITONIN: Procalcitonin: <0.1 ng/mL

## 2018-06-10 LAB — GLUCOSE, CAPILLARY: Glucose-Capillary: 120 mg/dL — ABNORMAL HIGH (ref 70–99)

## 2018-06-10 LAB — SARS CORONAVIRUS 2 BY RT PCR (HOSPITAL ORDER, PERFORMED IN ~~LOC~~ HOSPITAL LAB): SARS Coronavirus 2: NEGATIVE

## 2018-06-10 MED ORDER — FLUTICASONE FUROATE-VILANTEROL 100-25 MCG/INH IN AEPB
1.0000 | INHALATION_SPRAY | Freq: Every day | RESPIRATORY_TRACT | Status: DC
  Administered 2018-06-11 – 2018-06-13 (×3): 1 via RESPIRATORY_TRACT
  Filled 2018-06-10: qty 28

## 2018-06-10 MED ORDER — INSULIN ASPART 100 UNIT/ML ~~LOC~~ SOLN: 0.0000 [IU] | Freq: Every day | SUBCUTANEOUS | Status: DC

## 2018-06-10 MED ORDER — GABAPENTIN 300 MG PO CAPS
300.0000 mg | ORAL_CAPSULE | Freq: Every day | ORAL | Status: DC
  Administered 2018-06-10 – 2018-06-12 (×3): 300 mg via ORAL
  Filled 2018-06-10 (×3): qty 1

## 2018-06-10 MED ORDER — SODIUM CHLORIDE 0.9 % IV SOLN
2.0000 g | Freq: Once | INTRAVENOUS | Status: AC
  Administered 2018-06-10: 2 g via INTRAVENOUS
  Filled 2018-06-10: qty 2

## 2018-06-10 MED ORDER — VANCOMYCIN HCL 10 G IV SOLR
1500.0000 mg | Freq: Once | INTRAVENOUS | Status: DC
  Filled 2018-06-10: qty 1500

## 2018-06-10 MED ORDER — FLUOXETINE HCL 20 MG PO CAPS
40.0000 mg | ORAL_CAPSULE | Freq: Every day | ORAL | Status: DC
  Administered 2018-06-11 – 2018-06-13 (×3): 40 mg via ORAL
  Filled 2018-06-10 (×3): qty 2

## 2018-06-10 MED ORDER — ONDANSETRON HCL 4 MG/2ML IJ SOLN
4.0000 mg | Freq: Four times a day (QID) | INTRAMUSCULAR | Status: DC | PRN
  Administered 2018-06-11: 4 mg via INTRAVENOUS
  Filled 2018-06-10: qty 2

## 2018-06-10 MED ORDER — LEVETIRACETAM ER 500 MG PO TB24
500.0000 mg | ORAL_TABLET | Freq: Every day | ORAL | Status: DC
  Administered 2018-06-11 – 2018-06-13 (×3): 500 mg via ORAL
  Filled 2018-06-10 (×3): qty 1

## 2018-06-10 MED ORDER — POLYETHYLENE GLYCOL 3350 17 G PO PACK: 17.0000 g | PACK | Freq: Every day | ORAL | Status: DC | PRN

## 2018-06-10 MED ORDER — ACETAMINOPHEN 325 MG PO TABS
650.0000 mg | ORAL_TABLET | Freq: Four times a day (QID) | ORAL | Status: DC | PRN
  Filled 2018-06-10 (×2): qty 2

## 2018-06-10 MED ORDER — LORATADINE 10 MG PO TABS
10.0000 mg | ORAL_TABLET | Freq: Every day | ORAL | Status: DC
  Administered 2018-06-11 – 2018-06-13 (×3): 10 mg via ORAL
  Filled 2018-06-10 (×3): qty 1

## 2018-06-10 MED ORDER — ACETAMINOPHEN 650 MG RE SUPP: 650.0000 mg | Freq: Four times a day (QID) | RECTAL | Status: DC | PRN

## 2018-06-10 MED ORDER — IPRATROPIUM-ALBUTEROL 0.5-2.5 (3) MG/3ML IN SOLN
3.0000 mL | Freq: Once | RESPIRATORY_TRACT | Status: AC
  Administered 2018-06-10: 3 mL via RESPIRATORY_TRACT
  Filled 2018-06-10: qty 3

## 2018-06-10 MED ORDER — SODIUM CHLORIDE 0.9 % IV SOLN
1.0000 g | INTRAVENOUS | Status: DC
  Administered 2018-06-10: 15:00:00 1 g via INTRAVENOUS
  Filled 2018-06-10: qty 10

## 2018-06-10 MED ORDER — ALBUTEROL SULFATE 0.63 MG/3ML IN NEBU: 3.0000 mL | INHALATION_SOLUTION | Freq: Three times a day (TID) | RESPIRATORY_TRACT | Status: DC | PRN

## 2018-06-10 MED ORDER — METHYLPREDNISOLONE SODIUM SUCC 125 MG IJ SOLR
60.0000 mg | Freq: Two times a day (BID) | INTRAMUSCULAR | Status: DC
  Administered 2018-06-11: 60 mg via INTRAVENOUS
  Filled 2018-06-10: qty 2

## 2018-06-10 MED ORDER — VANCOMYCIN HCL 10 G IV SOLR
1250.0000 mg | INTRAVENOUS | Status: DC
  Administered 2018-06-12: 1250 mg via INTRAVENOUS
  Filled 2018-06-10: qty 1250

## 2018-06-10 MED ORDER — CLOPIDOGREL BISULFATE 75 MG PO TABS
75.0000 mg | ORAL_TABLET | Freq: Every day | ORAL | Status: DC
  Administered 2018-06-11 – 2018-06-13 (×3): 75 mg via ORAL
  Filled 2018-06-10 (×3): qty 1

## 2018-06-10 MED ORDER — IPRATROPIUM BROMIDE 0.03 % NA SOLN
2.0000 | Freq: Two times a day (BID) | NASAL | Status: DC
  Administered 2018-06-10 – 2018-06-13 (×6): 2 via NASAL
  Filled 2018-06-10: qty 30

## 2018-06-10 MED ORDER — PANTOPRAZOLE SODIUM 40 MG PO TBEC
40.0000 mg | DELAYED_RELEASE_TABLET | Freq: Every day | ORAL | Status: DC
  Administered 2018-06-11 – 2018-06-13 (×3): 40 mg via ORAL
  Filled 2018-06-10 (×3): qty 1

## 2018-06-10 MED ORDER — METHYLPREDNISOLONE SODIUM SUCC 125 MG IJ SOLR
125.0000 mg | Freq: Once | INTRAMUSCULAR | Status: AC
  Administered 2018-06-10: 17:00:00 125 mg via INTRAVENOUS
  Filled 2018-06-10: qty 2

## 2018-06-10 MED ORDER — SODIUM CHLORIDE 0.9 % IV SOLN
2.0000 g | Freq: Two times a day (BID) | INTRAVENOUS | Status: DC
  Administered 2018-06-11 – 2018-06-12 (×3): 2 g via INTRAVENOUS
  Filled 2018-06-10 (×3): qty 2

## 2018-06-10 MED ORDER — ENOXAPARIN SODIUM 40 MG/0.4ML ~~LOC~~ SOLN
40.0000 mg | Freq: Two times a day (BID) | SUBCUTANEOUS | Status: DC
  Administered 2018-06-10 – 2018-06-13 (×6): 40 mg via SUBCUTANEOUS
  Filled 2018-06-10 (×6): qty 0.4

## 2018-06-10 MED ORDER — ONDANSETRON HCL 4 MG PO TABS: 4.0000 mg | ORAL_TABLET | Freq: Four times a day (QID) | ORAL | Status: DC | PRN

## 2018-06-10 MED ORDER — UMECLIDINIUM BROMIDE 62.5 MCG/INH IN AEPB
1.0000 | INHALATION_SPRAY | Freq: Every day | RESPIRATORY_TRACT | Status: DC
  Administered 2018-06-11 – 2018-06-13 (×3): 1 via RESPIRATORY_TRACT
  Filled 2018-06-10: qty 7

## 2018-06-10 MED ORDER — FLUTICASONE-UMECLIDIN-VILANT 100-62.5-25 MCG/INH IN AEPB: 1.0000 | INHALATION_SPRAY | Freq: Every day | RESPIRATORY_TRACT | Status: DC

## 2018-06-10 MED ORDER — IPRATROPIUM-ALBUTEROL 0.5-2.5 (3) MG/3ML IN SOLN
3.0000 mL | Freq: Four times a day (QID) | RESPIRATORY_TRACT | Status: DC
  Administered 2018-06-11 – 2018-06-13 (×10): 3 mL via RESPIRATORY_TRACT
  Filled 2018-06-10 (×10): qty 3

## 2018-06-10 MED ORDER — ALBUTEROL SULFATE (2.5 MG/3ML) 0.083% IN NEBU: 2.5000 mg | INHALATION_SOLUTION | RESPIRATORY_TRACT | Status: DC | PRN

## 2018-06-10 MED ORDER — INSULIN ASPART 100 UNIT/ML ~~LOC~~ SOLN
0.0000 [IU] | Freq: Three times a day (TID) | SUBCUTANEOUS | Status: DC
  Administered 2018-06-11: 5 [IU] via SUBCUTANEOUS
  Administered 2018-06-11 (×2): 2 [IU] via SUBCUTANEOUS
  Administered 2018-06-12: 13:00:00 1 [IU] via SUBCUTANEOUS
  Administered 2018-06-12: 18:00:00 2 [IU] via SUBCUTANEOUS
  Filled 2018-06-10 (×5): qty 1

## 2018-06-10 MED ORDER — ATORVASTATIN CALCIUM 20 MG PO TABS
80.0000 mg | ORAL_TABLET | Freq: Every evening | ORAL | Status: DC
  Administered 2018-06-10 – 2018-06-12 (×3): 80 mg via ORAL
  Filled 2018-06-10 (×3): qty 4

## 2018-06-10 MED ORDER — IPRATROPIUM-ALBUTEROL 0.5-2.5 (3) MG/3ML IN SOLN
3.0000 mL | Freq: Once | RESPIRATORY_TRACT | Status: AC
  Administered 2018-06-10: 17:00:00 3 mL via RESPIRATORY_TRACT
  Filled 2018-06-10: qty 3

## 2018-06-10 MED ORDER — VANCOMYCIN HCL IN DEXTROSE 1-5 GM/200ML-% IV SOLN
1000.0000 mg | Freq: Once | INTRAVENOUS | Status: AC
  Administered 2018-06-10: 17:00:00 1000 mg via INTRAVENOUS
  Filled 2018-06-10: qty 200

## 2018-06-10 NOTE — H&P (Addendum)
[facility] [place] NAME: Katherine [name]    MR#:  [id number]  DATE OF BIRTH:  [birth year]  DATE OF ADMISSION:  06/10/2018  PRIMARY CARE PHYSICIAN: Valerie Roys, DO   REQUESTING/REFERRING PHYSICIAN: Merlyn Lot, MD  CHIEF COMPLAINT:   Chief Complaint  Patient presents with  . Altered Mental Status    HISTORY OF PRESENT ILLNESS:  Katherine [name]  is a 78 y.o. female with a known history of hypertension, COPD, CKD 3, CHF, anxiety, depression, type 2 diabetes history of stroke who presented to the ED with confusion.  Patient states she "just started feeling weird" today, and she does not know why. She endorses shortness of breath. She denies any fevers or chills.  She denies any chest pain, cough, abdominal pain.  She endorses a mild headache, she denies any neck pain or neck stiffness.  In the ED, she was tachypneic with respiratory rates in the low 20s.  Labs were significant for creatinine 1.57, alkaline phosphatase 500, AST 121, ALT 65, hemoglobin 9.8.  UA with large leukocytes, small hemoglobin, rare bacteria.  Chest x-ray with bilateral opacities.  COVID testing was negative.  She was started on vancomycin and cefepime.  Hospitalists were called for admission.  PAST MEDICAL HISTORY:   Past Medical History:  Diagnosis Date  . Angioedema   . Anxiety   . Anxiety and depression   . CHF (congestive heart failure) ([place])   . CHF (congestive heart failure) ([place])   . Chronic constipation   . Chronic kidney disease    stage 3  . COPD (chronic obstructive pulmonary disease) ([place])   . Diabetes mellitus without complication ([place])   . Gallstones   . GERD (gastroesophageal reflux disease)   . Hypertension   . Left ventricular hypertrophy   . Osteoarthritis   . Prurigo nodularis   . Seizures ([place])   . Stroke ([place])   . Tobacco abuse   . Vitamin D deficiency disease     PAST SURGICAL HISTORY:   Past Surgical History:  Procedure  Laterality Date  . CHOLECYSTECTOMY    . 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   Procedure: TOTAL KNEE ARTHROPLASTY;  Surgeon: Hessie Knows, MD;  Location: [facility] ORS;  Service: Orthopedics;  Laterality: Right;    SOCIAL HISTORY:   Social History   Tobacco Use  . Smoking status: Former Smoker    Packs/day: 0.50    Years: 60.00    Pack years: 30.00    Types: Cigarettes  . Smokeless tobacco: Never Used  . Tobacco comment: quite 66mo ago-03/26/18  Substance Use Topics  . Alcohol use: No    Alcohol/week: 0.0 standard drinks    Comment: rare    FAMILY HISTORY:   Family History  Problem Relation Age of Onset  . Alcohol abuse Mother   . Sickle cell anemia Daughter   . Hypertension Son   . Cancer Neg Hx   . COPD Neg Hx   . Diabetes Neg Hx   . Heart disease Neg Hx   . Stroke Neg Hx     DRUG ALLERGIES:   Allergies  Allergen Reactions  . Bee Venom Swelling  . Enalapril Maleate Swelling    REVIEW OF SYSTEMS:   Review of Systems  Constitutional: Positive for malaise/fatigue. Negative for chills and fever.  HENT: Negative for congestion and sore throat.   Eyes: Negative for blurred vision and double vision.  Respiratory: Positive for shortness of breath. Negative for cough.   Cardiovascular: Negative for chest pain and palpitations.  Gastrointestinal: Negative for nausea and vomiting.  Genitourinary: Negative for dysuria and urgency.  Musculoskeletal: Negative for back pain and neck pain.  Neurological: Positive for weakness and headaches. Negative for dizziness.  Psychiatric/Behavioral: Negative for depression. The patient is not nervous/anxious.     MEDICATIONS AT HOME:   Prior to Admission medications   Medication Sig Start Date End Date Taking? Authorizing Provider  atorvastatin (LIPITOR) 80 MG tablet Take 80 mg by mouth every evening.    Yes [provider]  cetirizine (ZYRTEC) 10 MG tablet Take 5 mg by mouth at  bedtime.   Yes [provider]  cholecalciferol (VITAMIN D) 1000 units tablet Take 1,000 Units by mouth daily.   Yes [provider]  clopidogrel (PLAVIX) 75 MG tablet Take 75 mg by mouth daily.   Yes [provider]  diclofenac sodium (VOLTAREN) 1 % GEL Apply 2 g topically every 12 (twelve) hours as needed (for left knee pain).    Yes [provider]  docusate sodium (COLACE) 100 MG capsule Take 100 mg by mouth 2 (two) times daily.   Yes [provider]  FLUoxetine (PROZAC) 40 MG capsule Take 40 mg by mouth daily.    Yes [provider]  Fluticasone-Umeclidin-Vilant 100-62.5-25 MCG/INH AEPB Inhale 1 puff into the lungs daily.   Yes [provider]  gabapentin (NEURONTIN) 300 MG capsule Take 300 mg by mouth at bedtime.   Yes [provider]  ipratropium (ATROVENT) 0.03 % nasal spray Place 2 sprays into both nostrils 2 (two) times daily.    Yes [provider]  ipratropium-albuterol (DUONEB) 0.5-2.5 (3) MG/3ML SOLN Take 3 mLs by nebulization 2 (two) times daily.    Yes [provider]  levETIRAcetam (KEPPRA XR) 500 MG 24 hr tablet Take 1 tablet (500 mg total) by mouth daily. 11/19/17  Yes Venancio Poisson, NP  linagliptin (TRADJENTA) 5 MG TABS tablet Take 5 mg by mouth daily.   Yes [provider]  meclizine (ANTIVERT) 25 MG tablet Take 25 mg by mouth every 12 (twelve) hours as needed for dizziness.    Yes [provider]  Multiple Vitamin (MULTIVITAMIN WITH MINERALS) TABS tablet Take 1 tablet by mouth daily.   Yes [provider]  pantoprazole (PROTONIX) 40 MG tablet Take 1 tablet (40 mg total) by mouth daily. 04/29/18  Yes Bettey Costa, MD  vitamin B-12 1000 MCG tablet Take 1 tablet (1,000 mcg total) by mouth daily. 04/30/18  Yes Bettey Costa, MD  acetaminophen (TYLENOL) 650 MG CR tablet Take 650 mg by mouth 3 (three) times daily.     [provider]  albuterol (ACCUNEB) 0.63  MG/3ML nebulizer solution Take 3 mLs by nebulization every 8 (eight) hours as needed for wheezing.     [provider]  albuterol (PROVENTIL HFA;VENTOLIN HFA) 108 (90 Base) MCG/ACT inhaler Inhale 2 puffs into the lungs every 4 (four) hours as needed for wheezing or shortness of breath.    [provider]  LORazepam (ATIVAN) 2 MG/ML injection Inject 0.5 mLs (1 mg total) into the muscle as needed for seizure. Patient taking differently: Inject 1 mg into the muscle daily as needed for seizure.  02/28/18   Stark Jock Jude, MD      VITAL SIGNS:  Blood pressure 111/81, pulse 88, temperature (!) 96.9 F (36.1 C), temperature source Rectal, resp. rate (!) 21, height 5\' 4"  (  1.626 m), weight 127 kg, SpO2 100 %.  PHYSICAL EXAMINATION:  Physical Exam  GENERAL:  78 y.o.-year-old patient lying in the bed with no acute distress.  EYES: Pupils equal, round, reactive to light and accommodation. No scleral icterus. Extraocular muscles intact.  HEENT: Head atraumatic, normocephalic. Oropharynx and nasopharynx clear.  NECK:  Supple, no jugular venous distention. No thyroid enlargement, no tenderness.  LUNGS: + Diminished breath sounds in the lung bases bilaterally, + faint expiratory wheezing throughout all lung fields.  No rales,rhonchi or crepitation. No use of accessory muscles of respiration.  CARDIOVASCULAR: RRR, S1, S2 normal. No murmurs, rubs, or gallops.  ABDOMEN: Soft, nontender, nondistended. Bowel sounds present. No organomegaly or mass.  EXTREMITIES: No pedal edema, cyanosis, or clubbing.  NEUROLOGIC: Cranial nerves II through XII are intact. Muscle strength 5/5 in all extremities. Sensation intact. Gait not checked.  PSYCHIATRIC: The patient is alert and oriented x 3.  SKIN: No obvious rash, lesion, or ulcer.   LABORATORY PANEL:   CBC Recent Labs  Lab 06/10/18 1302  WBC 5.1  HGB 9.8*  HCT 31.5*  PLT 330    ------------------------------------------------------------------------------------------------------------------  Chemistries  Recent Labs  Lab 06/10/18 1302  NA 138  K 4.5  CL 101  CO2 26  GLUCOSE 100*  BUN 23  CREATININE 1.57*  CALCIUM 9.9  AST 121*  ALT 65*  ALKPHOS 500*  BILITOT 0.6   ------------------------------------------------------------------------------------------------------------------  Cardiac Enzymes No results for input(s): TROPONINI in the last 168 hours. ------------------------------------------------------------------------------------------------------------------  RADIOLOGY:  Dg Chest Portable 1 View  Result Date: 06/10/2018 CLINICAL DATA:  Acute mental status change. EXAM: PORTABLE CHEST 1 VIEW COMPARISON:  April 13, 2018 FINDINGS: The heart size borderline. The hila and mediastinum are unremarkable. Streaky opacity in left lung, particularly the base, is identified and new. Mild opacity also seen in the right base, new as well. No other acute abnormalities. IMPRESSION: Bilateral streaky opacities identified. The opacities could represent atelectasis or developing infiltrate. Recommend clinical correlation and attention on follow-up. Electronically Signed   By: Dorise Bullion III M.D   On: 06/10/2018 13:55      IMPRESSION AND PLAN:   Acute hypoxic respiratory failure- likely due to COPD exacerbation.  Questionable pneumonia on chest x-ray. Requiring 2 L O2 in the ED. -COVID testing negative -Continue IV vancomycin and cefepime for now for HCAP coverage (hospitalized about 1 month ago) -Check procalcitonin -IV Solu-Medrol -Repeat CXR in the morning -DuoNebs PRN -Wean O2 as able -Continue home inhalers  UTI versus asymptomatic bacteriuria- patient denies any urinary symptoms, but was confused earlier today per family.  No signs of sepsis on admission. -Abx as above -Follow-up blood and urine cultures  AMS- likely due to above. Resolved  in the ED. -UDS negative, VBG unremarkable -Check ammonia, given liver enzyme abnormalities -Continue to monitor  Elevated liver enzymes/elevated alkaline phosphatase- unclear etiology. No history of liver disease per chart review. -Check hepatitis panel and GGT -RUQ abdominal US  Type 2 diabetes- blood sugars well-controlled -SSI  CKD 3- creatinine at baseline -Avoid nephrotoxic agents -Monitor  History of stroke-no residual deficits -Continue Lipitor and Plavix  Depression-stable -Continue home Prozac  History of seizures-no seizure-like activity noted -Continue home Keppra  All the records are reviewed and case discussed with ED provider. Management plans discussed with the patient, family and they are in agreement.  CODE STATUS: Full  TOTAL TIME TAKING CARE OF THIS PATIENT: 45 minutes.    Berna Spare Mayo M.D on 06/10/2018 at 5:19 PM  Between 7am to 6pm - Pager - [phone]  After 6pm go to [email or web address] - Proofreader  [facility]  Office  [phone]  CC: Primary care physician; Valerie Roys, DO   Note: This dictation was prepared with Dragon dictation along with smaller phrase technology. Any transcriptional errors that result from this process are unintentional.

## 2018-06-10 NOTE — Progress Notes (Signed)
Anticoagulation monitoring(Lovenox):  78 yo female ordered Lovenox 40 mg Q24h  Filed Weights   06/10/18 1251  Weight: 280 lb (127 kg)   BMI 48    Lab Results  Component Value Date   CREATININE 1.57 (H) 06/10/2018   CREATININE 1.39 (H) 04/29/2018   CREATININE 1.39 (H) 04/28/2018   Estimated Creatinine Clearance: 40.2 mL/min (A) (by C-G formula based on SCr of 1.57 mg/dL (H)). Hemoglobin & Hematocrit     Component Value Date/Time   HGB 9.8 (L) 06/10/2018 1302   HGB 12.6 03/27/2017 0948   HCT 31.5 (L) 06/10/2018 1302   HCT 37.9 03/27/2017 0948     Per Protocol for Patient with estCrcl > 30 ml/min and BMI > 40, will transition to Lovenox 40 mg Q12h.

## 2018-06-10 NOTE — ED Notes (Signed)
ED TO INPATIENT HANDOFF REPORT  ED Nurse Name and Phone #: [email or web address] [phone]  S Name/Age/Gender Katherine [name] 78 y.o. female Room/Bed: ED26A/ED26A  Code Status   Code Status: Prior  Home/SNF/Other SNF A/Ox4 Is this baseline? yes  Triage Complete: Triage complete  Chief Complaint AMS  Triage Note Pt arrived EMS from [facility] health care for AMS. Pt moaning and groaning in bed and will say ouch if touch her but otherwise no other words coming out. Hx of stroke. Called nursing facility and difficult to get baseline and LKW. They report she is normally oriented at baseline. Wears O2 at baseline   Allergies Allergies  Allergen Reactions  . Bee Venom Swelling  . Enalapril Maleate Swelling    Level of Care/Admitting Diagnosis ED Disposition    ED Disposition Condition [facility] Area: [place] [[id number]]  Level of Care: Med-Surg [16]  Covid Evaluation: N/A  Diagnosis: Acute respiratory failure with hypoxia [facility]) [[id number]]  Admitting Physician: Hyman Bible DODD [[id number]]  Attending Physician: Hyman Bible DODD [[id number]]  Estimated length of stay: past midnight tomorrow  Certification:: I certify this patient will need inpatient services for at least 2 midnights  PT Class (Do Not Modify): Inpatient [101]  PT Acc Code (Do Not Modify): Private [1]       B Medical/Surgery History Past Medical History:  Diagnosis Date  . Angioedema   . Anxiety   . Anxiety and depression   . CHF (congestive heart failure) ([place])   . CHF (congestive heart failure) ([place])   . Chronic constipation   . Chronic kidney disease    stage 3  . COPD (chronic obstructive pulmonary disease) ([place])   . Diabetes mellitus without complication ([place])   . Gallstones   . GERD (gastroesophageal reflux disease)   . Hypertension   . Left ventricular hypertrophy   . Osteoarthritis   . Prurigo nodularis   . Seizures ([place])   . Stroke ([place])   . Tobacco abuse   . Vitamin D  deficiency disease    Past Surgical History:  Procedure Laterality Date  . CHOLECYSTECTOMY    . 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   Procedure: TOTAL KNEE ARTHROPLASTY;  Surgeon: Hessie Knows, MD;  Location: [facility] ORS;  Service: Orthopedics;  Laterality: Right;     A IV Location/Drains/Wounds Patient Lines/Drains/Airways Status   Active Line/Drains/Airways    Name:   Placement date:   Placement time:   Site:   Days:   Peripheral IV 06/10/18 Right Antecubital   06/10/18    1300    Antecubital   less than 1   External Urinary Catheter   06/10/18    1407    -   less than 1          Intake/Output Last 24 hours  Intake/Output Summary (Last 24 hours) at 06/10/2018 1754 Last data filed at 06/10/2018 1709 Gross per 24 hour  Intake 200 ml  Output -  Net 200 ml    Labs/Imaging Results for orders placed or performed during the hospital encounter of 06/10/18 (from the past 48 hour(s))  Urinalysis, Complete w Microscopic     Status: Abnormal   Collection Time: 06/10/18 12:57 PM  Result Value Ref Range   Color, Urine YELLOW (A) YELLOW   APPearance TURBID (A) CLEAR   Specific Gravity, Urine 1.015 1.005 - 1.030   pH 5.0 5.0 - 8.0   Glucose, UA  NEGATIVE NEGATIVE mg/dL   Hgb urine dipstick SMALL (A) NEGATIVE   Bilirubin Urine NEGATIVE NEGATIVE   Ketones, ur NEGATIVE NEGATIVE mg/dL   Protein, ur NEGATIVE NEGATIVE mg/dL   Nitrite NEGATIVE NEGATIVE   Leukocytes,Ua LARGE (A) NEGATIVE   RBC / HPF >50 (H) 0 - 5 RBC/hpf   WBC, UA >50 (H) 0 - 5 WBC/hpf   Bacteria, UA RARE (A) NONE SEEN   Squamous Epithelial / LPF 0-5 0 - 5   WBC Clumps PRESENT     Comment: Performed at [facility], [address] [place], [place] [address]  Lactic acid, plasma     Status: None   Collection Time: 06/10/18 12:57 PM  Result Value Ref Range   Lactic Acid, Venous 1.0 0.5 - 1.9 mmol/L    Comment: Performed at [facility], [address]  [place], [place] [address]  CBC with Differential     Status: Abnormal   Collection Time: 06/10/18  1:02 PM  Result Value Ref Range   WBC 5.1 4.0 - 10.5 K/uL   RBC 3.64 (L) 3.87 - 5.11 MIL/uL   Hemoglobin 9.8 (L) 12.0 - 15.0 g/dL   HCT 31.5 (L) 36.0 - 46.0 %   MCV 86.5 80.0 - 100.0 fL   MCH 26.9 26.0 - 34.0 pg   MCHC 31.1 30.0 - 36.0 g/dL   RDW 17.2 (H) 11.5 - 15.5 %   Platelets 330 150 - 400 K/uL   nRBC 0.0 0.0 - 0.2 %   Neutrophils Relative % 60 %   Neutro Abs 3.1 1.7 - 7.7 K/uL   Lymphocytes Relative 21 %   Lymphs Abs 1.1 0.7 - 4.0 K/uL   Monocytes Relative 10 %   Monocytes Absolute 0.5 0.1 - 1.0 K/uL   Eosinophils Relative 8 %   Eosinophils Absolute 0.4 0.0 - 0.5 K/uL   Basophils Relative 0 %   Basophils Absolute 0.0 0.0 - 0.1 K/uL   Immature Granulocytes 1 %   Abs Immature Granulocytes 0.03 0.00 - 0.07 K/uL    Comment: Performed at [facility], [address] [place], [place] [address]  Comprehensive metabolic panel     Status: Abnormal   Collection Time: 06/10/18  1:02 PM  Result Value Ref Range   Sodium 138 135 - 145 mmol/L   Potassium 4.5 3.5 - 5.1 mmol/L   Chloride 101 98 - 111 mmol/L   CO2 26 22 - 32 mmol/L   Glucose, Bld 100 (H) 70 - 99 mg/dL   BUN 23 8 - 23 mg/dL   Creatinine, Ser 1.57 (H) 0.44 - 1.00 mg/dL   Calcium 9.9 8.9 - 10.3 mg/dL   Total Protein 7.3 6.5 - 8.1 g/dL   Albumin 2.6 (L) 3.5 - 5.0 g/dL   AST 121 (H) 15 - 41 U/L   ALT 65 (H) 0 - 44 U/L   Alkaline Phosphatase 500 (H) 38 - 126 U/L   Total Bilirubin 0.6 0.3 - 1.2 mg/dL   GFR calc non Af Amer 32 (L) >60 mL/min   GFR calc Af Amer 37 (L) >60 mL/min   Anion gap 11 5 - 15    Comment: Performed at [facility], [address] [place], [place] [address]  Blood gas, venous     Status: Abnormal   Collection Time: 06/10/18  1:18 PM  Result Value Ref Range   pH, Ven 7.36 7.250 - 7.430   pCO2, Ven 53 44.0 - 60.0 mmHg  pO2, Ven 63.0 (H) 32.0 - 45.0 mmHg   Bicarbonate 29.9 (H) 20.0  - 28.0 mmol/L   Acid-Base Excess 3.2 (H) 0.0 - 2.0 mmol/L   O2 Saturation 90.8 %   Patient temperature 37.0    Collection site VEIN    Sample type VEIN     Comment: Performed at [facility], [address], [place], [place] [address]  Urine Drug Screen, Qualitative ([facility] only)     Status: None   Collection Time: 06/10/18  1:20 PM  Result Value Ref Range   Tricyclic, Ur Screen NONE DETECTED NONE DETECTED   Amphetamines, Ur Screen NONE DETECTED NONE DETECTED   MDMA (Ecstasy)Ur Screen NONE DETECTED NONE DETECTED   Cocaine Metabolite,Ur [place] NONE DETECTED NONE DETECTED   Opiate, Ur Screen NONE DETECTED NONE DETECTED   Phencyclidine (PCP) Ur S NONE DETECTED NONE DETECTED   Cannabinoid 50 Ng, Ur [place] NONE DETECTED NONE DETECTED   Barbiturates, Ur Screen NONE DETECTED NONE DETECTED   Benzodiazepine, Ur Scrn NONE DETECTED NONE DETECTED   Methadone Scn, Ur NONE DETECTED NONE DETECTED    Comment: (NOTE) Tricyclics + metabolites, urine    Cutoff 1000 ng/mL Amphetamines + metabolites, urine  Cutoff 1000 ng/mL MDMA (Ecstasy), urine              Cutoff 500 ng/mL Cocaine Metabolite, urine          Cutoff 300 ng/mL Opiate + metabolites, urine        Cutoff 300 ng/mL Phencyclidine (PCP), urine         Cutoff 25 ng/mL Cannabinoid, urine                 Cutoff 50 ng/mL Barbiturates + metabolites, urine  Cutoff 200 ng/mL Benzodiazepine, urine              Cutoff 200 ng/mL Methadone, urine                   Cutoff 300 ng/mL The urine drug screen provides only a preliminary, unconfirmed analytical test result and should not be used for non-medical purposes. Clinical consideration and professional judgment should be applied to any positive drug screen result due to possible interfering substances. A more specific alternate chemical method must be used in order to obtain a confirmed analytical result. Gas chromatography / mass spectrometry (GC/MS) is the preferred confirmat ory  method. Performed at [facility], [address] [place], [place] [address]   SARS Coronavirus 2 [facility] order, Performed in [facility] hospital lab)     Status: None   Collection Time: 06/10/18  2:30 PM  Result Value Ref Range   SARS Coronavirus 2 NEGATIVE NEGATIVE    Comment: (NOTE) If result is NEGATIVE SARS-CoV-2 target nucleic acids are NOT DETECTED. The SARS-CoV-2 RNA is generally detectable in upper and lower  respiratory specimens during the acute phase of infection. The lowest  concentration of SARS-CoV-2 viral copies this assay can detect is 250  copies / mL. A negative result does not preclude SARS-CoV-2 infection  and should not be used as the sole basis for treatment or other  patient management decisions.  A negative result may occur with  improper specimen collection / handling, submission of specimen other  than nasopharyngeal swab, presence of viral mutation(s) within the  areas targeted by this assay, and inadequate number of viral copies  (<250 copies / mL). A negative result must be combined with clinical  observations, patient history, and epidemiological information.  If result is POSITIVE SARS-CoV-2 target nucleic acids are DETECTED. The SARS-CoV-2 RNA is generally detectable in upper and lower  respiratory specimens dur ing the acute phase of infection.  Positive  results are indicative of active infection with SARS-CoV-2.  Clinical  correlation with patient history and other diagnostic information is  necessary to determine patient infection status.  Positive results do  not rule out bacterial infection or co-infection with other viruses. If result is PRESUMPTIVE POSTIVE SARS-CoV-2 nucleic acids MAY BE PRESENT.   A presumptive positive result was obtained on the submitted specimen  and confirmed on repeat testing.  While 2019 novel coronavirus  (SARS-CoV-2) nucleic acids may be present in the submitted sample  additional confirmatory testing  may be necessary for epidemiological  and / or clinical management purposes  to differentiate between  SARS-CoV-2 and other Sarbecovirus currently known to infect humans.  If clinically indicated additional testing with an alternate test  methodology [phone]) is advised. The SARS-CoV-2 RNA is generally  detectable in upper and lower respiratory sp ecimens during the acute  phase of infection. The expected result is Negative. Fact Sheet for Patients:  StrictlyIdeas.no Fact Sheet for Healthcare Providers: BankingDealers.co.za This test is not yet approved or cleared by the [place] FDA and has been authorized for detection and/or diagnosis of SARS-CoV-2 by FDA under an Emergency Use Authorization (EUA).  This EUA will remain in effect (meaning this test can be used) for the duration of the COVID-19 declaration under Section 564(b)(1) of the Act, 21 U.S.C. section 360bbb-3(b)(1), unless the authorization is terminated or revoked sooner. Performed at [facility], [address] [place], [place] [address]   Lactic acid, plasma     Status: None   Collection Time: 06/10/18  2:36 PM  Result Value Ref Range   Lactic Acid, Venous 0.9 0.5 - 1.9 mmol/L    Comment: Performed at [facility], [address], [place], [place] [address]   Dg Chest Portable 1 View  Result Date: 06/10/2018 CLINICAL DATA:  Acute mental status change. EXAM: PORTABLE CHEST 1 VIEW COMPARISON:  April 13, 2018 FINDINGS: The heart size borderline. The hila and mediastinum are unremarkable. Streaky opacity in left lung, particularly the base, is identified and new. Mild opacity also seen in the right base, new as well. No other acute abnormalities. IMPRESSION: Bilateral streaky opacities identified. The opacities could represent atelectasis or developing infiltrate. Recommend clinical correlation and attention on follow-up. Electronically Signed   By:  Dorise Bullion III M.D   On: 06/10/2018 13:55    Pending Labs Unresulted Labs (From admission, onward)    Start     Ordered   06/11/18 0500  MRSA PCR Screening  Tomorrow morning,   STAT     06/10/18 1710   06/10/18 1445  Urine culture  Add-on,   AD     06/10/18 1444   06/10/18 1418  Blood Culture (routine x 2)  BLOOD CULTURE X 2,   STAT     06/10/18 1418   Signed and Held  Procalcitonin - Baseline  ONCE - STAT,   STAT     Signed and Held   Signed and Held  Comprehensive metabolic panel  Tomorrow morning,   R     Signed and Held   Signed and Held  CBC  Tomorrow morning,   R     Signed and Held   Signed and Held  Hepatitis panel, acute  Once,   R  Signed and Held   Signed and Held  Ammonia  Once,   R     Signed and Held   Signed and Held  Gamma GT  Once,   R     Signed and Held          Vitals/Pain Today's Vitals   06/10/18 1522 06/10/18 1530 06/10/18 1630 06/10/18 1730  BP: (!) 108/59 116/74 111/81 138/78  Pulse: 91 87 88 91  Resp: (!) 21 (!) 26 (!) 21 (!) 29  Temp:      TempSrc:      SpO2: 99% 100% 100% 97%  Weight:      Height:      PainSc: 0-No pain       Isolation Precautions No active isolations  Medications Medications  ceFEPIme (MAXIPIME) 2 g in sodium chloride 0.9 % 100 mL IVPB (has no administration in time range)  vancomycin (VANCOCIN) 1,500 mg in sodium chloride 0.9 % 500 mL IVPB (has no administration in time range)  vancomycin (VANCOCIN) 1,250 mg in sodium chloride 0.9 % 250 mL IVPB (has no administration in time range)  vancomycin (VANCOCIN) IVPB 1000 mg/200 mL premix (0 mg Intravenous Stopped 06/10/18 1710)  ceFEPIme (MAXIPIME) 2 g in sodium chloride 0.9 % 100 mL IVPB (0 g Intravenous Stopped 06/10/18 1709)  ipratropium-albuterol (DUONEB) 0.5-2.5 (3) MG/3ML nebulizer solution 3 mL (3 mLs Nebulization Given 06/10/18 1715)  ipratropium-albuterol (DUONEB) 0.5-2.5 (3) MG/3ML nebulizer solution 3 mL (3 mLs Nebulization Given 06/10/18 1715)   methylPREDNISolone sodium succinate (SOLU-MEDROL) 125 mg/2 mL injection 125 mg (125 mg Intravenous Given 06/10/18 1715)    Mobility non-ambulatory High fall risk   Focused Assessments    R Recommendations: See Admitting Provider Note  Report given to:   Additional Notes: pt is A/O x4. Pt is on 2L [place]

## 2018-06-10 NOTE — ED Notes (Signed)
Pt thought had bowel movement. No stool seen at this time.

## 2018-06-10 NOTE — ED Notes (Signed)
Patient denies pain and is resting comfortably.  

## 2018-06-10 NOTE — ED Provider Notes (Signed)
[facility] Emergency Department Provider Note    First MD Initiated Contact with Patient 06/10/18 1249     (approximate)  I have reviewed the triage vital signs and the nursing notes.   HISTORY  Chief Complaint Altered Mental Status   HPI Katherine [name] is a 78 y.o. female below listed past medical history presents the ER for shortness of breath altered mental status cough and confusion.  Coming from rest time.  Denies any pain.  Does feel some shortness of breath.  Has had some dysuria.  Denies taking any new medications.  She is not quite certain as to what brought her to the ER.  Denies any falls.  No numbness or tingling.  No headaches.   Past Medical History:  Diagnosis Date   Angioedema    Anxiety    Anxiety and depression    CHF (congestive heart failure) (HCC)    CHF (congestive heart failure) (HCC)    Chronic constipation    Chronic kidney disease    stage 3   COPD (chronic obstructive pulmonary disease) (HCC)    Diabetes mellitus without complication (HCC)    Gallstones    GERD (gastroesophageal reflux disease)    Hypertension    Left ventricular hypertrophy    Osteoarthritis    Prurigo nodularis    Seizures (HCC)    Stroke (HCC)    Tobacco abuse    Vitamin D deficiency disease    Family History  Problem Relation Age of Onset   Alcohol abuse Mother    Sickle cell anemia Daughter    Hypertension Son    Cancer Neg Hx    COPD Neg Hx    Diabetes Neg Hx    Heart disease Neg Hx    Stroke Neg Hx    Past Surgical History:  Procedure Laterality Date   CHOLECYSTECTOMY     POLYPECTOMY  11/2011   vocal cord   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   Procedure: TOTAL KNEE ARTHROPLASTY;  Surgeon: Hessie Knows, MD;  Location: [facility] ORS;  Service: Orthopedics;  Laterality: Right;   Patient Active Problem List   Diagnosis Date Noted   Palliative care encounter 05/10/2018   Localized edema 05/10/2018     Shortness of breath 05/10/2018   Hematemesis 04/28/2018   Influenza A 04/13/2018   Acute on chronic congestive heart failure ([place])    COPD with acute exacerbation ([place]) 02/24/2018   Chronic diastolic heart failure ([place]) 12/31/2017   Lymphedema 12/31/2017   COPD exacerbation ([place]) 11/30/2017   Urinary tract infection 11/22/2017   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[place]) 11/16/2017   Diabetes mellitus type 2, uncomplicated ([place]) 22/48/2500   Pressure injury of skin 10/29/2017   Acute kidney injury superimposed on CKD ([place]) 10/24/2017   Severe sepsis ([place]) 10/24/2017   Possible Seizures ([place]) 10/08/2017   Hyperlipemia 10/08/2017   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   Stroke-like episode ([place]) s/p IV tpa 10/04/2017   Palliative care by specialist    Elevated rheumatoid factor 09/05/2017   Frequent hospital admissions 08/22/2017   Aphasia 06/28/2017   Hand pain 03/21/2017   Dry skin 03/21/2017   Pain in finger of left hand 09/12/2016   Chronic fatigue 06/12/2016   Left knee pain 06/12/2016   Goals of care, counseling/discussion 03/13/2016   Primary localized osteoarthritis of right knee 02/08/2016   OSA (obstructive sleep apnea) 09/16/2015   Rotator cuff syndrome 09/07/2015   Pulmonary scarring 07/27/2015   Sleep disturbance 04/14/2015  Coronary artery disease 03/14/2015   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 Authorizing Provider  acetaminophen (TYLENOL) 650 MG CR tablet Take 650 mg by mouth 3 (three) times daily.     [provider]  albuterol (ACCUNEB) 0.63 MG/3ML nebulizer solution Take 3 mLs by nebulization every 8 (eight) hours as needed for wheezing.     [provider]  albuterol (PROVENTIL HFA;VENTOLIN HFA) 108 (90 Base) MCG/ACT inhaler Inhale 2 puffs into the lungs every 4 (four) hours as needed for wheezing or shortness of breath.    [provider]  atorvastatin (LIPITOR) 80 MG tablet Take 80 mg by mouth every evening.     [provider]  cetirizine (ZYRTEC) 10 MG tablet Take 5 mg by mouth at bedtime.    [provider]  cholecalciferol (VITAMIN D) 1000 units tablet Take 1,000 Units by mouth daily.    [provider]  clopidogrel (PLAVIX) 75 MG tablet Take 75 mg by mouth daily.    [provider]  diclofenac sodium (VOLTAREN) 1 % GEL Apply 2 g topically every 12 (twelve) hours as needed (for left knee pain).     [provider]  FLUoxetine (PROZAC) 40 MG capsule Take 40 mg by mouth daily.     [provider]  Fluticasone-Umeclidin-Vilant 100-62.5-25 MCG/INH AEPB Inhale 1 puff into the lungs daily.    [provider]  gabapentin (NEURONTIN) 300 MG capsule Take 300 mg by mouth at bedtime.    [provider]  ipratropium (ATROVENT) 0.03 % nasal spray Place 2 sprays into both nostrils 2 (two) times daily.     [provider]  ipratropium-albuterol (DUONEB) 0.5-2.5 (3) MG/3ML SOLN Take 3 mLs by nebulization 2 (two) times daily.     [provider]  levETIRAcetam (KEPPRA XR) 500 MG 24 hr tablet Take 1 tablet (500 mg total) by mouth daily. 11/19/17   Venancio Poisson, NP  linagliptin (TRADJENTA) 5 MG TABS tablet Take 5 mg by mouth daily.    [provider]  LORazepam (ATIVAN) 2 MG/ML injection Inject 0.5 mLs (1 mg total) into the muscle as needed for  seizure. Patient taking differently: Inject 1 mg into the muscle daily as needed for seizure.  02/28/18   Stark Jock Jude, MD  meclizine (ANTIVERT) 25 MG tablet Take 25 mg by mouth every 12 (twelve) hours as needed for dizziness.     [provider]  Multiple Vitamin (MULTIVITAMIN WITH MINERALS) TABS tablet Take 1 tablet by mouth daily.    [provider]  pantoprazole (PROTONIX) 40 MG tablet Take 1 tablet (40 mg total) by mouth daily. 04/29/18   Bettey Costa, MD  vitamin B-12 1000 MCG tablet Take 1 tablet (1,000 mcg total) by mouth daily. 04/30/18   Bettey Costa, MD    Allergies Bee venom and Enalapril maleate    Social History Social History   Tobacco Use   Smoking  status: Former Smoker    Packs/day: 0.50    Years: 60.00    Pack years: 30.00    Types: Cigarettes   Smokeless tobacco: Never Used   Tobacco comment: quite 38mo ago-03/26/18  Substance Use Topics   Alcohol use: No    Alcohol/week: 0.0 standard drinks    Comment: rare   Drug use: No    Review of Systems Patient denies headaches, rhinorrhea, blurry vision, numbness, shortness of breath, chest pain, edema, cough, abdominal pain, nausea, vomiting, diarrhea, dysuria, fevers, rashes or hallucinations unless otherwise stated above in HPI. ____________________________________________   PHYSICAL EXAM:  VITAL SIGNS: Vitals:   06/10/18 1522 06/10/18 1530  BP: (!) 108/59 116/74  Pulse: 91 87  Resp: (!) 21 (!) 26  Temp:    SpO2: 99% 100%    Constitutional: Alert and oriented. But drowsy appearing Eyes: Conjunctivae are normal.  Head: Atraumatic. Nose: No congestion/rhinnorhea. Mouth/Throat: Mucous membranes are moist.   Neck: No stridor. Painless ROM.  Cardiovascular: Normal rate, regular rhythm. Grossly normal heart sounds.  Good peripheral circulation. Respiratory: Tachypnea with use of accessory muscles.  Expiratory wheeze throughout. Gastrointestinal: Soft and nontender. No distention. No  abdominal bruits. No CVA tenderness. Genitourinary:  Musculoskeletal: No lower extremity tenderness +1 edema.  No joint effusions. Neurologic:  Normal speech and language. No gross focal neurologic deficits are appreciated. No facial droop Skin:  Skin is warm, dry and intact. No rash noted. Psychiatric: Mood and affect are normal. Speech and behavior are normal.  ____________________________________________   LABS (all labs ordered are listed, but only abnormal results are displayed)  Results for orders placed or performed during the hospital encounter of 06/10/18 (from the past 24 hour(s))  Urinalysis, Complete w Microscopic     Status: Abnormal   Collection Time: 06/10/18 12:57 PM  Result Value Ref Range   Color, Urine YELLOW (A) YELLOW   APPearance TURBID (A) CLEAR   Specific Gravity, Urine 1.015 1.005 - 1.030   pH 5.0 5.0 - 8.0   Glucose, UA NEGATIVE NEGATIVE mg/dL   Hgb urine dipstick SMALL (A) NEGATIVE   Bilirubin Urine NEGATIVE NEGATIVE   Ketones, ur NEGATIVE NEGATIVE mg/dL   Protein, ur NEGATIVE NEGATIVE mg/dL   Nitrite NEGATIVE NEGATIVE   Leukocytes,Ua LARGE (A) NEGATIVE   RBC / HPF >50 (H) 0 - 5 RBC/hpf   WBC, UA >50 (H) 0 - 5 WBC/hpf   Bacteria, UA RARE (A) NONE SEEN   Squamous Epithelial / LPF 0-5 0 - 5   WBC Clumps PRESENT   Lactic acid, plasma     Status: None   Collection Time: 06/10/18 12:57 PM  Result Value Ref Range   Lactic Acid, Venous 1.0 0.5 - 1.9 mmol/L  CBC with Differential     Status: Abnormal   Collection Time: 06/10/18  1:02 PM  Result Value Ref Range   WBC 5.1 4.0 - 10.5 K/uL   RBC 3.64 (L) 3.87 - 5.11 MIL/uL   Hemoglobin 9.8 (L) 12.0 - 15.0 g/dL   HCT 31.5 (L) 36.0 - 46.0 %   MCV 86.5 80.0 - 100.0 fL   MCH 26.9 26.0 - 34.0 pg   MCHC 31.1 30.0 - 36.0 g/dL   RDW 17.2 (H) 11.5 - 15.5 %   Platelets 330 150 - 400 K/uL   nRBC 0.0 0.0 - 0.2 %   Neutrophils Relative % 60 %   Neutro Abs 3.1 1.7 - 7.7 K/uL   Lymphocytes Relative 21 %   Lymphs  Abs 1.1 0.7 - 4.0 K/uL   Monocytes Relative 10 %   Monocytes Absolute 0.5 0.1 - 1.0 K/uL   Eosinophils Relative 8 %   Eosinophils Absolute 0.4 0.0 - 0.5 K/uL   Basophils Relative 0 %   Basophils Absolute 0.0 0.0 - 0.1 K/uL   Immature Granulocytes 1 %   Abs Immature Granulocytes 0.03 0.00 - 0.07 K/uL  Comprehensive metabolic panel     Status: Abnormal   Collection Time: 06/10/18  1:02 PM  Result Value Ref Range   Sodium 138 135 - 145 mmol/L   Potassium 4.5 3.5 - 5.1 mmol/L   Chloride 101 98 - 111 mmol/L   CO2 26 22 - 32 mmol/L   Glucose, Bld 100 (H) 70 - 99 mg/dL   BUN 23 8 - 23 mg/dL   Creatinine, Ser 1.57 (H) 0.44 - 1.00 mg/dL   Calcium 9.9 8.9 - 10.3 mg/dL   Total Protein 7.3 6.5 - 8.1 g/dL   Albumin 2.6 (L) 3.5 - 5.0 g/dL   AST 121 (H) 15 - 41 U/L   ALT 65 (H) 0 - 44 U/L   Alkaline Phosphatase 500 (H) 38 - 126 U/L   Total Bilirubin 0.6 0.3 - 1.2 mg/dL   GFR calc non Af Amer 32 (L) >60 mL/min   GFR calc Af Amer 37 (L) >60 mL/min   Anion gap 11 5 - 15  Blood gas, venous     Status: Abnormal   Collection Time: 06/10/18  1:18 PM  Result Value Ref Range   pH, Ven 7.36 7.250 - 7.430   pCO2, Ven 53 44.0 - 60.0 mmHg   pO2, Ven 63.0 (H) 32.0 - 45.0 mmHg   Bicarbonate 29.9 (H) 20.0 - 28.0 mmol/L   Acid-Base Excess 3.2 (H) 0.0 - 2.0 mmol/L   O2 Saturation 90.8 %   Patient temperature 37.0    Collection site VEIN    Sample type VEIN   Urine Drug Screen, Qualitative ([facility] only)     Status: None   Collection Time: 06/10/18  1:20 PM  Result Value Ref Range   Tricyclic, Ur Screen NONE DETECTED NONE DETECTED   Amphetamines, Ur Screen NONE DETECTED NONE DETECTED   MDMA (Ecstasy)Ur Screen NONE DETECTED NONE DETECTED   Cocaine Metabolite,Ur [place] NONE DETECTED NONE DETECTED   Opiate, Ur Screen NONE DETECTED NONE DETECTED   Phencyclidine (PCP) Ur S NONE DETECTED NONE DETECTED   Cannabinoid 50 Ng, Ur [place] NONE DETECTED NONE DETECTED   Barbiturates, Ur Screen NONE DETECTED NONE DETECTED    Benzodiazepine, Ur Scrn NONE DETECTED NONE DETECTED   Methadone Scn, Ur NONE DETECTED NONE DETECTED  SARS Coronavirus 2 [facility] order, Performed in [facility] lab)     Status: None   Collection Time: 06/10/18  2:30 PM  Result Value Ref Range   SARS Coronavirus 2 NEGATIVE NEGATIVE  Lactic acid, plasma     Status: None   Collection Time: 06/10/18  2:36 PM  Result Value Ref Range   Lactic Acid, Venous 0.9 0.5 - 1.9 mmol/L   ____________________________________________  EKG My review and personal interpretation at Time: 12:54   Indication: ams  Rate: 95  Rhythm: sinus Axis: normal Other: normal intervals, no stemi ____________________________________________  RADIOLOGY  I personally reviewed all radiographic images ordered to evaluate for the above acute complaints and reviewed radiology reports and findings.  These findings were personally discussed with the patient.  Please see medical record for radiology report.  ____________________________________________  PROCEDURES  Procedure(s) performed:  .Critical Care Performed by: Merlyn Lot, MD Authorized by: Merlyn Lot, MD   Critical care provider statement:    Critical care time was exclusive of:  Separately billable procedures and treating other patients   Critical care was necessary to treat or prevent imminent or life-threatening deterioration of the following conditions:  Respiratory failure   Critical care was time spent personally by me on the following activities:  Development of treatment plan with patient or surrogate, discussions with consultants, evaluation of patient's response to treatment, examination of patient, obtaining history from patient or surrogate, ordering and performing treatments and interventions, ordering and review of laboratory studies, ordering and review of radiographic studies, pulse oximetry, re-evaluation of patient's condition and review of old charts      Critical  Care performed: yes ____________________________________________   INITIAL IMPRESSION / ASSESSMENT AND PLAN / ED COURSE  Pertinent labs & imaging results that were available during my care of the patient were reviewed by me and considered in my medical decision making (see chart for details).   DDX: Dehydration, sepsis, pna, uti, hypoglycemia, cva, drug effect, withdrawal, encephalitis   Katherine [name] is a 78 y.o. who presents to the ED with symptoms as described above.  Patient hypothermic with increased use of accessory muscles possible COPD exacerbation versus COVID-19 pneumonia in the above differential.  Blood will be sent for the by differential.  She is currently protecting her airway she is oriented but does appear septic.  Does not have any focal neuro deficits.  She is oriented x3.  Doubt CVA or CNS pathology.  More concerning for metabolic encephalopathy given findings concerning for sepsis.  Clinical Course as of Jun 09 1609  Mon Jun 10, 2018  1418 Chest x-ray has atelectasis versus pneumonia.  Given her shortness of breath with recent hospitalization will broaden on also cover for healthcare associated pneumonia.  Coronavirus test is negative.  Will give nebulizers as well as Solu-Medrol.  Will discuss with hospitalist for admission  Have discussed with the patient and available family all diagnostics and treatments performed thus far and all questions were answered to the best of my ability. The patient demonstrates understanding and agreement with plan.    [PR]    Clinical Course User Index [PR] Merlyn Lot, MD    The patient was evaluated in Emergency Department today for the symptoms described in the history of present illness. He/she was evaluated in the context of the global COVID-19 pandemic, which necessitated consideration that the patient might be at risk for infection with the SARS-CoV-2 virus that causes COVID-19. Institutional protocols and algorithms that  pertain to the evaluation of patients at risk for COVID-19 are in a state of rapid change based on information released by regulatory bodies including the CDC and federal and state organizations. These policies and algorithms were followed during the patient's care in the ED.  As part of my medical decision making, I reviewed the following data within the [place] notes reviewed and incorporated, Labs reviewed, notes from prior ED visits and [place] Controlled Substance Database   ____________________________________________   FINAL CLINICAL IMPRESSION(S) / ED DIAGNOSES  Final diagnoses:  Acute cystitis with hematuria  Sepsis, due to unspecified organism, unspecified whether acute organ dysfunction present [facility])      NEW MEDICATIONS STARTED DURING THIS VISIT:  New Prescriptions   No medications on file     Note:  This document was prepared using Dragon voice recognition software  and may include unintentional dictation errors.    Merlyn Lot, MD 06/10/18 [phone]

## 2018-06-10 NOTE — ED Triage Notes (Signed)
Pt arrived EMS from [facility] health care for AMS. Pt moaning and groaning in bed and will say ouch if touch her but otherwise no other words coming out. Hx of stroke. Called nursing facility and difficult to get baseline and LKW. They report she is normally oriented at baseline. Wears O2 at baseline

## 2018-06-10 NOTE — ED Notes (Signed)
Per dr Quentin Cornwall ok with 1 IV

## 2018-06-10 NOTE — Consult Note (Signed)
Pharmacy Antibiotic Note  Katherine [name] is a 78 y.o. female admitted on 06/10/2018 with HAP.  Pharmacy has been consulted for vancomycin and cefepime dosing. MRSA PCR has not been obtained for this admission.   Baseline Scr (mg/dL): 1.39 (04/29/2018)  Plan: Will start Cefepime 2 g Q12H.   Will order the remaining loading dose of 1500 mg x1. Then patient will start maintenance dose of 1250 mg Q36H. Will reassess Scr with AM labs.   AUC Goal: 400-550 Expected AUC: 500.0  Will obtain MRSA PCR.   Height: 5\' 4"  (162.6 cm) Weight: 280 lb (127 kg) IBW/kg (Calculated) : 54.7  Temp (24hrs), Avg:97.6 F (36.4 C), Min:96.9 F (36.1 C), Max:98.2 F (36.8 C)  Recent Labs  Lab 06/10/18 1257 06/10/18 1302 06/10/18 1436  WBC  --  5.1  --   CREATININE  --  1.57*  --   LATICACIDVEN 1.0  --  0.9    Estimated Creatinine Clearance: 40.2 mL/min (A) (by C-G formula based on SCr of 1.57 mg/dL (H)).    Allergies  Allergen Reactions  . Bee Venom Swelling  . Enalapril Maleate Swelling    Antimicrobials this admission: 04/27 CTX x1  04/27 Cefepime >>  04/27 Vancomycin >>   Dose adjustments this admission: N/A  Microbiology results: 4/27 BCx: pending  4/27 UCx: pending   Thank you for allowing pharmacy to be a part of this patient's care.  Rowland Lathe, PharmD 06/10/2018 4:18 PM

## 2018-06-10 NOTE — Progress Notes (Signed)
Family Meeting Note  [facility] Directive:no  Today a meeting took place with the Patient.  Patient is able to participate.  The following clinical team members were present during this meeting:MD  The following were discussed:Patient's diagnosis: acute hypoxic respiratory failure, Patient's progosis: Unable to determine and Goals for treatment: Full Code  Additional follow-up to be provided: prn  Time spent during discussion:20 minutes  Evette Doffing, MD

## 2018-06-10 NOTE — ED Notes (Signed)
Patient kept moaning during assessment and would not answer questions. RN informed patient that she was able to tell the other nurse her hand hurt from arthritis she should be able to answer questions. Pt then answered all questions and is oriented at this time. nursing home reported pt was in a daze and not responding this morning.

## 2018-06-11 ENCOUNTER — Inpatient Hospital Stay: Payer: Medicare HMO

## 2018-06-11 LAB — COMPREHENSIVE METABOLIC PANEL
ALT: 68 U/L — ABNORMAL HIGH (ref 0–44)
AST: 110 U/L — ABNORMAL HIGH (ref 15–41)
Albumin: 2.5 g/dL — ABNORMAL LOW (ref 3.5–5.0)
Alkaline Phosphatase: 482 U/L — ABNORMAL HIGH (ref 38–126)
Anion gap: 9 (ref 5–15)
BUN: 26 mg/dL — ABNORMAL HIGH (ref 8–23)
CO2: 25 mmol/L (ref 22–32)
Calcium: 10 mg/dL (ref 8.9–10.3)
Chloride: 102 mmol/L (ref 98–111)
Creatinine, Ser: 1.61 mg/dL — ABNORMAL HIGH (ref 0.44–1.00)
GFR calc Af Amer: 36 mL/min — ABNORMAL LOW (ref >60)
GFR calc non Af Amer: 31 mL/min — ABNORMAL LOW (ref >60)
Glucose, Bld: 239 mg/dL — ABNORMAL HIGH (ref 70–99)
Potassium: 4.8 mmol/L (ref 3.5–5.1)
Sodium: 136 mmol/L (ref 135–145)
Total Bilirubin: 0.5 mg/dL (ref 0.3–1.2)
Total Protein: 7.4 g/dL (ref 6.5–8.1)

## 2018-06-11 LAB — CBC
HCT: 32 % — ABNORMAL LOW (ref 36.0–46.0)
Hemoglobin: 10.1 g/dL — ABNORMAL LOW (ref 12.0–15.0)
MCH: 26.9 pg (ref 26.0–34.0)
MCHC: 31.6 g/dL (ref 30.0–36.0)
MCV: 85.1 fL (ref 80.0–100.0)
Platelets: 329 10*3/uL (ref 150–400)
RBC: 3.76 MIL/uL — ABNORMAL LOW (ref 3.87–5.11)
RDW: 17.1 % — ABNORMAL HIGH (ref 11.5–15.5)
WBC: 3.7 10*3/uL — ABNORMAL LOW (ref 4.0–10.5)
nRBC: 0 % (ref 0.0–0.2)

## 2018-06-11 LAB — GLUCOSE, CAPILLARY
Glucose-Capillary: 181 mg/dL — ABNORMAL HIGH (ref 70–99)
Glucose-Capillary: 292 mg/dL — ABNORMAL HIGH (ref 70–99)
Glucose-Capillary: 188 mg/dL — ABNORMAL HIGH (ref 70–99)
Glucose-Capillary: 156 mg/dL — ABNORMAL HIGH (ref 70–99)

## 2018-06-11 LAB — MRSA PCR SCREENING: MRSA by PCR: POSITIVE — AB

## 2018-06-11 LAB — GAMMA GT: GGT: 335 U/L — ABNORMAL HIGH (ref 7–50)

## 2018-06-11 MED ORDER — CHLORHEXIDINE GLUCONATE CLOTH 2 % EX PADS
6.0000 | MEDICATED_PAD | Freq: Every day | CUTANEOUS | Status: DC
  Administered 2018-06-12 – 2018-06-13 (×2): 6 via TOPICAL

## 2018-06-11 MED ORDER — MUPIROCIN 2 % EX OINT
1.0000 "application " | TOPICAL_OINTMENT | Freq: Two times a day (BID) | CUTANEOUS | Status: DC
  Administered 2018-06-11 – 2018-06-13 (×4): 1 via NASAL
  Filled 2018-06-11: qty 22

## 2018-06-11 MED ORDER — METHYLPREDNISOLONE SODIUM SUCC 40 MG IJ SOLR: 40.0000 mg | Freq: Every day | INTRAMUSCULAR | Status: DC

## 2018-06-11 NOTE — Evaluation (Signed)
Physical Therapy Evaluation Patient Details Name: Katherine [name] MRN: [id number] DOB: [birth year] Today's Date: 06/11/2018   History of Present Illness  78 y/o F who presented with SOB and admitted with respiratory failure.  Pt on 2L O2 crhonically.  Pt's PMH includes COPD, seizures, stroke, CHF, R TKA.  Clinical Impression  Pt is willing to work with PT and reports she is eager to try to get back to walking, but that she has essentially not walked in months and ultimately was very limited with how much she was able to do with PT this date.  She was able to participate with some light bed exercises apart from the exam, but with mobility and attempt at standing pt needed a lot of assist and essentially was max assist just to get to standing for a few seconds before needing to sit right back down.  Pt eager to try to get to the recliner, but unsafe to attempt even minimal stepping at this point.     Follow Up Recommendations SNF;Supervision/Assistance - 24 hour    Equipment Recommendations  None recommended by PT    Recommendations for Other Services       Precautions / Restrictions Precautions Precautions: Fall Restrictions Weight Bearing Restrictions: No      Mobility  Bed Mobility Overal bed mobility: Needs Assistance Bed Mobility: Supine to Sit     Supine to sit: Mod assist     General bed mobility comments: Pt needed considerable assist to get to sitting at EOB, needed assist from L forwarm (not hand)   Transfers Overall transfer level: Needs assistance Equipment used: Rolling walker (2 wheeled) Transfers: Sit to/from Stand Sit to Stand: Max assist         General transfer comment: Pt with very little ability to assist to getting to standing, very heavy assist from PT to attain standing, pt unable to maintain standing even with heavy assist and did not tolerate more than 5 seconds with UE use of walker and max assist   Ambulation/Gait             General Gait  Details: unable/unsafe to attempt  Stairs            Wheelchair Mobility    Modified Rankin (Stroke Patients Only)       Balance Overall balance assessment: Needs assistance Sitting-balance support: Bilateral upper extremity supported Sitting balance-Leahy Scale: Fair     Standing balance support: Bilateral upper extremity supported Standing balance-Leahy Scale: Zero Standing balance comment: Pt with little to no tolerance for standing this date                             Pertinent Vitals/Pain Pain Assessment: 0-10 Pain Score: 3     Home Living Family/patient expects to be discharged to:: Skilled nursing facility                      Prior Function Level of Independence: Needs assistance   Gait / Transfers Assistance Needed: Pt reports that she has essentially done no walking since being at rehab 3 months ago           Hand Dominance        Extremity/Trunk Assessment   Upper Extremity Assessment Upper Extremity Assessment: Generalized weakness    Lower Extremity Assessment Lower Extremity Assessment: Generalized weakness       Communication   Communication: HOH  Cognition Arousal/Alertness: Awake/alert Behavior During  Therapy: WFL for tasks assessed/performed Overall Cognitive Status: Within Functional Limits for tasks assessed                                        General Comments      Exercises General Exercises - Lower Extremity Ankle Circles/Pumps: AROM;10 reps Long Arc Quad: Strengthening;5 reps Heel Slides: Strengthening;5 reps Hip ABduction/ADduction: Strengthening;5 reps   Assessment/Plan    PT Assessment Patient needs continued PT services  PT Problem List Decreased strength;Decreased range of motion;Decreased activity tolerance;Decreased safety awareness;Decreased knowledge of use of DME;Decreased knowledge of precautions;Decreased mobility;Decreased balance       PT Treatment  Interventions Gait training;Functional mobility training;Therapeutic activities;DME instruction;Therapeutic exercise;Balance training;Patient/family education    PT Goals (Current goals can be found in the Care Plan section)  Acute Rehab PT Goals Patient Stated Goal: go home PT Goal Formulation: With patient Time For Goal Achievement: 06/25/18 Potential to Achieve Goals: Fair    Frequency Min 2X/week   Barriers to discharge        Co-evaluation               AM-PAC PT "6 Clicks" Mobility  Outcome Measure Help needed turning from your back to your side while in a flat bed without using bedrails?: A Lot Help needed moving from lying on your back to sitting on the side of a flat bed without using bedrails?: Total Help needed moving to and from a bed to a chair (including a wheelchair)?: Total Help needed standing up from a chair using your arms (e.g., wheelchair or bedside chair)?: Total Help needed to walk in hospital room?: Total Help needed climbing 3-5 steps with a railing? : Total 6 Click Score: 7    End of Session Equipment Utilized During Treatment: Gait belt Activity Tolerance: Patient tolerated treatment well Patient left: with bed alarm set;with call bell/phone within reach   PT Visit Diagnosis: Muscle weakness (generalized) (M62.81);Difficulty in walking, not elsewhere classified (R26.2);Unsteadiness on feet (R26.81)    Time: 1015-1050 PT Time Calculation (min) (ACUTE ONLY): 35 min   Charges:   PT Evaluation $PT Eval Low Complexity: 1 Low PT Treatments $Therapeutic Exercise: 8-22 mins        Kreg Shropshire, DPT 06/11/2018, 1:30 PM

## 2018-06-11 NOTE — Progress Notes (Signed)
Please note, patient is currently followed by outpatient Palliative at [facility]. CMRN Deliliah Belenda Cruise made aware. Flo Shanks BSN, RN, [facility] [employer] [phone]

## 2018-06-11 NOTE — TOC Progression Note (Signed)
Transition of Care [facility]) - Progression Note    Patient Details  Name: Esther Bradstreet MRN: [id number] Date of Birth: 1940-05-24  Transition of Care [facility]) CM/SW [place], RN Phone Number: 06/11/2018, 2:51 PM  Clinical Narrative:     Spoke to the husband on the phone and notified him that [facility] has agreed to take the patient back at DC, he stated that is fine and he will call the patient, He was concerned about her breathing, I explained that she does have COPD and is on Chronic O2, I let him know that the patient is alert and oriented today   Expected Discharge Plan: Skilled Nursing Facility Barriers to Discharge: Continued Medical Work up  Expected Discharge Plan and Services Expected Discharge Plan: [place]   Discharge Planning Services: CM Consult   Living arrangements for the past 2 months: [place] Determinants of Health (SDOH) Interventions    Readmission Risk Interventions Readmission Risk Prevention Plan 04/29/2018 04/29/2018 09/22/2017  Transportation Screening Complete Complete Complete  Medication Review Press photographer) Complete Complete Complete  PCP or Specialist appointment within 3-5 days of discharge Not Complete - Complete  PCP/Specialist Appt Not Complete comments Patient to return to SNF, and they will make appointments  - -  [place] or [place] (No Data) - Complete  SW Recovery Care/Counseling Consult (No Data) - Not Complete  Palliative Care Screening Complete - Patient refused  Medication Reconcilation (Pharmacy) - - Complete  [place] Complete - Patient refused  Some recent data might be hidden

## 2018-06-11 NOTE — NC FL2 (Signed)
[place] LEVEL OF CARE SCREENING TOOL     IDENTIFICATION  Patient Name: Katherine [name] Birthdate: 1940/09/28 Sex: female Admission Date (Current Location): 06/10/2018  [place] and [place] Number:  Engineering geologist and Address:  [facility], [address] [place], [place] [address]      Provider Number: [id number]  Attending Physician Name and Address:  Loletha Grayer, MD  Relative Name and Phone Number:  Bennie Hind [phone]    Current Level of Care: Hospital Recommended Level of Care: [place] Prior Approval Number:    Date Approved/Denied: 02/08/16 PASRR Number: [id number]  Discharge Plan: SNF    Current Diagnoses: Patient Active Problem List   Diagnosis Date Noted  . Palliative care encounter 05/10/2018  . Localized edema 05/10/2018  . Shortness of breath 05/10/2018  . Hematemesis 04/28/2018  . Influenza A 04/13/2018  . Acute on chronic congestive heart failure ([place])   . COPD with acute exacerbation ([place]) 02/24/2018  . Chronic diastolic heart failure ([place]) 12/31/2017  . Lymphedema 12/31/2017  . COPD exacerbation ([place]) 11/30/2017  . Urinary tract infection 11/22/2017  . 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[place]) 11/16/2017  . Diabetes mellitus type 2, uncomplicated ([place]) 47/10/6281  . Pressure injury of skin 10/29/2017  . Acute kidney injury superimposed on CKD ([place]) 10/24/2017  . Severe sepsis ([place]) 10/24/2017  . Possible Seizures ([place]) 10/08/2017  . Hyperlipemia 10/08/2017  . 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 Stroke-like episode ([place]) s/p IV tpa 10/04/2017  . Palliative care by specialist   . Elevated rheumatoid factor 09/05/2017  . Frequent hospital admissions 08/22/2017  . Aphasia 06/28/2017  . Hand pain 03/21/2017  . Dry skin 03/21/2017  . Pain in finger of left hand 09/12/2016  . Chronic fatigue 06/12/2016  . Left knee pain 06/12/2016  . Goals of care,  counseling/discussion 03/13/2016  . Primary localized osteoarthritis of right knee 02/08/2016  . OSA (obstructive sleep apnea) 09/16/2015  . Rotator cuff syndrome 09/07/2015  . 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 Sleep disturbance 04/14/2015  . Coronary artery disease 03/14/2015  . 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 Acute respiratory failure with hypoxia ([place]) 09/16/2014  . 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 Anxiety   . Tobacco abuse   . Prurigo nodularis   . GERD (gastroesophageal reflux disease)   . COPD (chronic obstructive pulmonary disease) ([place])   . Osteoarthritis   . Vitamin D deficiency disease   . Chronic constipation   . CKD (chronic kidney disease), stage III ([place])   . Essential hypertension 05/20/2013  . Morbid obesity ([place]) 05/20/2013    Orientation RESPIRATION BLADDER Height & Weight     Self, Time, Situation, Place  Normal Continent Weight: 127 kg Height:  5\' 4"  (162.6 cm)  BEHAVIORAL SYMPTOMS/MOOD NEUROLOGICAL BOWEL NUTRITION STATUS      Continent    AMBULATORY STATUS COMMUNICATION OF NEEDS Skin   Extensive Assist Verbally Normal                       Personal Care Assistance Level of Assistance  Dressing, Bathing Bathing Assistance: Limited assistance   Dressing Assistance: Maximum assistance     Functional Limitations Info  Sight, Hearing, Speech Sight Info: Adequate Hearing Info: Adequate Speech Info: Adequate    SPECIAL CARE FACTORS FREQUENCY  PT (By licensed PT)     PT Frequency: 5 times per week  Contractures Contractures Info: Not present    Additional Factors Info  Code Status Code Status Info: full code             Current Medications (06/11/2018):  This is the current hospital active medication list Current Facility-Administered Medications  Medication Dose Route Frequency Provider Last Rate Last Dose  . acetaminophen (TYLENOL) tablet 650 mg  650 mg Oral Q6H PRN Mayo, Pete Pelt, MD       Or  .  acetaminophen (TYLENOL) suppository 650 mg  650 mg Rectal Q6H PRN Mayo, Pete Pelt, MD      . albuterol (PROVENTIL) (2.5 MG/3ML) 0.083% nebulizer solution 2.5 mg  2.5 mg Inhalation Q4H PRN Mayo, Pete Pelt, MD      . atorvastatin (LIPITOR) tablet 80 mg  80 mg Oral QPM Mayo, Pete Pelt, MD   80 mg at 06/10/18 2117  . ceFEPIme (MAXIPIME) 2 g in sodium chloride 0.9 % 100 mL IVPB  2 g Intravenous Q12H Mayo, Pete Pelt, MD 200 mL/hr at 06/11/18 0446 2 g at 06/11/18 0446  . clopidogrel (PLAVIX) tablet 75 mg  75 mg Oral Daily Mayo, Pete Pelt, MD   75 mg at 06/11/18 0908  . enoxaparin (LOVENOX) injection 40 mg  40 mg Subcutaneous Q12H Mayo, Pete Pelt, MD   40 mg at 06/11/18 0909  . FLUoxetine (PROZAC) capsule 40 mg  40 mg Oral Daily Mayo, Pete Pelt, MD   40 mg at 06/11/18 0908  . fluticasone furoate-vilanterol (BREO ELLIPTA) 100-25 MCG/INH 1 puff  1 puff Inhalation Daily Mayo, Pete Pelt, MD   1 puff at 06/11/18 0909   And  . umeclidinium bromide (INCRUSE ELLIPTA) 62.5 MCG/INH 1 puff  1 puff Inhalation Daily Mayo, Pete Pelt, MD   1 puff at 06/11/18 0910  . gabapentin (NEURONTIN) capsule 300 mg  300 mg Oral QHS Mayo, Pete Pelt, MD   300 mg at 06/10/18 2118  . insulin aspart (novoLOG) injection 0-5 Units  0-5 Units Subcutaneous QHS Mayo, Pete Pelt, MD      . insulin aspart (novoLOG) injection 0-9 Units  0-9 Units Subcutaneous TID WC Mayo, Pete Pelt, MD   5 Units at 06/11/18 1230  . ipratropium (ATROVENT) 0.03 % nasal spray 2 spray  2 spray Each Nare BID Mayo, Pete Pelt, MD   2 spray at 06/11/18 [phone]  . ipratropium-albuterol (DUONEB) 0.5-2.5 (3) MG/3ML nebulizer solution 3 mL  3 mL Nebulization Q6H Mayo, Pete Pelt, MD   3 mL at 06/11/18 1348  . levETIRAcetam (KEPPRA XR) 24 hr tablet 500 mg  500 mg Oral Daily Mayo, Pete Pelt, MD   500 mg at 06/11/18 0908  . loratadine (CLARITIN) tablet 10 mg  10 mg Oral Daily Mayo, Pete Pelt, MD   10 mg at 06/11/18 0908  . methylPREDNISolone sodium succinate (SOLU-MEDROL) 125 mg/2  mL injection 60 mg  60 mg Intravenous Q12H Mayo, Pete Pelt, MD   60 mg at 06/11/18 0444  . ondansetron (ZOFRAN) tablet 4 mg  4 mg Oral Q6H PRN Mayo, Pete Pelt, MD       Or  . ondansetron [facility]) injection 4 mg  4 mg Intravenous Q6H PRN Mayo, Pete Pelt, MD      . pantoprazole (PROTONIX) EC tablet 40 mg  40 mg Oral Daily Mayo, Pete Pelt, MD   40 mg at 06/11/18 0908  . polyethylene glycol (MIRALAX / GLYCOLAX) packet 17 g  17 g Oral Daily PRN Mayo, Pete Pelt, MD      . [  START ON 06/12/2018] vancomycin (VANCOCIN) 1,250 mg in sodium chloride 0.9 % 250 mL IVPB  1,250 mg Intravenous Q36H Mayo, Pete Pelt, MD         Discharge Medications: Please see discharge summary for a list of discharge medications.  Relevant Imaging Results:  Relevant Lab Results:   Additional Information [id number]  Su Hilt, RN

## 2018-06-11 NOTE — Plan of Care (Signed)

## 2018-06-11 NOTE — TOC Initial Note (Signed)
Transition of Care [facility]) - Initial/Assessment Note    Patient Details  Name: Katherine [name] MRN: [id number] Date of Birth: 14-May-1940  Transition [facility]) CM/SW Contact:    Su Hilt, RN Phone Number: 06/11/2018, 2:36 PM  Clinical Narrative:                 Patient admits from [facility] care She has lived there since Feb of 2020  She has had multiple admissions She is followed outpatient by palliative at [facility] at [place] has agreed to accept the patient back FL2 and PASSR  Completed Possible DC to [facility] tomorow  Expected Discharge Plan: Skilled Nursing Facility Barriers to Discharge: Continued Medical Work up   Patient Goals and CMS Choice        Expected Discharge Plan and Services Expected Discharge Plan: [place]   Discharge Planning Services: CM Consult   Living arrangements for the past 2 months: [place]                                      Prior Living Arrangements/Services Living arrangements for the past 2 months: [place] Lives with:: Facility Resident Patient language and need for interpreter reviewed:: No Do you feel safe going back to the place where you live?: Yes      Need for Family Participation in Patient Care: No (Comment) Care giver support system in place?: Yes (comment)   Criminal Activity/Legal Involvement Pertinent to Current Situation/Hospitalization: No - Comment as needed  Activities of Daily Living Home Assistive Devices/Equipment: Environmental consultant (specify type) ADL Screening (condition at time of admission) Patient's cognitive ability adequate to safely complete daily activities?: Yes Is the patient deaf or have difficulty hearing?: No Does the patient have difficulty seeing, even when wearing glasses/contacts?: No Does the patient have difficulty concentrating, remembering, or making decisions?: No Patient able to express need for assistance with  ADLs?: Yes Does the patient have difficulty dressing or bathing?: Yes Independently performs ADLs?: No Communication: Independent Dressing (OT): Needs assistance Is this a change from baseline?: Pre-admission baseline Grooming: Independent Feeding: Independent Bathing: Needs assistance Is this a change from baseline?: Pre-admission baseline Toileting: Needs assistance Is this a change from baseline?: Pre-admission baseline In/Out Bed: Needs assistance Is this a change from baseline?: Pre-admission baseline Walks in Home: Needs assistance Is this a change from baseline?: Pre-admission baseline Does the patient have difficulty walking or climbing stairs?: Yes Weakness of Legs: Both Weakness of Arms/Hands: None  Permission Sought/Granted                  Emotional Assessment Appearance:: Appears stated age Attitude/Demeanor/Rapport: Engaged Affect (typically observed): Accepting Orientation: : Oriented to Self, Oriented to Place, Oriented to  Time, Oriented to Situation Alcohol / Substance Use: Never Used Psych Involvement: No (comment)  Admission diagnosis:  Acute cystitis with hematuria [N30.01] Sepsis, due to unspecified organism, unspecified whether acute organ dysfunction present [facility]) [A41.9] Patient Active Problem List   Diagnosis Date Noted  . Palliative care encounter 05/10/2018  . Localized edema 05/10/2018  . Shortness of breath 05/10/2018  . Hematemesis 04/28/2018  . Influenza A 04/13/2018  . Acute on chronic congestive heart failure ([place])   . COPD with acute exacerbation ([place]) 02/24/2018  . Chronic diastolic heart failure ([place]) 12/31/2017  . Lymphedema 12/31/2017  . COPD exacerbation ([place]) 11/30/2017  . Urinary tract infection 11/22/2017  .  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[place]) 11/16/2017  . Diabetes mellitus type 2, uncomplicated ([place]) 16/11/9602  . Pressure injury of skin 10/29/2017  . Acute kidney injury superimposed on CKD ([place]) 10/24/2017   . Severe sepsis ([place]) 10/24/2017  . Possible Seizures ([place]) 10/08/2017  . Hyperlipemia 10/08/2017  . 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 Stroke-like episode ([place]) s/p IV tpa 10/04/2017  . Palliative care by specialist   . Elevated rheumatoid factor 09/05/2017  . Frequent hospital admissions 08/22/2017  . Aphasia 06/28/2017  . Hand pain 03/21/2017  . Dry skin 03/21/2017  . Pain in finger of left hand 09/12/2016  . Chronic fatigue 06/12/2016  . Left knee pain 06/12/2016  . Goals of care, counseling/discussion 03/13/2016  . Primary localized osteoarthritis of right knee 02/08/2016  . OSA (obstructive sleep apnea) 09/16/2015  . Rotator cuff syndrome 09/07/2015  . 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 Sleep disturbance 04/14/2015  . Coronary artery disease 03/14/2015  . 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 Acute respiratory failure with hypoxia ([place]) 09/16/2014  . 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 Anxiety   . Tobacco abuse   . Prurigo nodularis   . GERD (gastroesophageal reflux disease)   . COPD (chronic obstructive pulmonary disease) ([place])   . Osteoarthritis   . Vitamin D deficiency disease   . Chronic constipation   . CKD (chronic kidney disease), stage III ([place])   . Essential hypertension 05/20/2013  . Morbid obesity ([place]) 05/20/2013   PCP:  Valerie Roys, DO Pharmacy:   [facility] [phone] [place] [address] [place] [address] Phone: [phone] Fax: [phone]     Social Determinants of Health (SDOH) Interventions    Readmission Risk Interventions Readmission Risk Prevention Plan 04/29/2018 04/29/2018 09/22/2017  Transportation Screening Complete Complete Complete  Medication Review Press photographer) Complete Complete Complete  PCP or Specialist appointment within 3-5 days of discharge Not Complete - Complete  PCP/Specialist Appt Not Complete comments Patient to return to  SNF, and they will make appointments  - -  [place] or [place] (No Data) - Complete  SW Recovery Care/Counseling Consult (No Data) - Not Complete  Palliative Care Screening Complete - Patient refused  Medication Reconcilation (Pharmacy) - - Complete  Skilled Nursing Facility Complete - Patient refused  Some recent data might be hidden

## 2018-06-11 NOTE — Progress Notes (Signed)
Patient ID: Katherine [name], female   DOB: 02-24-1940, 78 y.o.   MRN: [id number]  [facility] PROGRESS NOTE  Katherine [name] HEN:[id number] DOB: 17-Sep-1940 DOA: 06/10/2018 PCP: Valerie Roys, DO  HPI/Subjective: Patient states she came in with a possible passing out episode.  The admitting physician brought her in with altered mental status.  She was admitted with COPD exacerbation.  She states her breathing is better.  Patient complains of some tremor of the upper extremities.  She had some pain in the right shoulder but that has gotten better.  Objective: Vitals:   06/11/18 0742 06/11/18 1349  BP:    Pulse:    Resp:    Temp:    SpO2: 97% 97%    Filed Weights   06/10/18 1251  Weight: 127 kg    ROS: Review of Systems  Constitutional: Negative for chills and fever.  Eyes: Negative for blurred vision.  Respiratory: Positive for shortness of breath. Negative for cough.   Cardiovascular: Negative for chest pain.  Gastrointestinal: Negative for abdominal pain, constipation, diarrhea, nausea and vomiting.  Genitourinary: Negative for dysuria.  Musculoskeletal: Negative for joint pain.  Neurological: Positive for tremors. Negative for dizziness and headaches.   Exam: Physical Exam  HENT:  Nose: No mucosal edema.  Mouth/Throat: No oropharyngeal exudate or posterior oropharyngeal edema.  Eyes: Pupils are equal, round, and reactive to light. Conjunctivae, EOM and lids are normal.  Neck: No JVD present. Carotid bruit is not present. No edema present. No thyroid mass and no thyromegaly present.  Cardiovascular: S1 normal and S2 normal. Exam reveals no gallop.  No murmur heard. Pulses:      Dorsalis pedis pulses are 2+ on the right side and 2+ on the left side.  Respiratory: No respiratory distress. She has decreased breath sounds in the right lower field and the left lower field. She has no wheezes. She has no rhonchi. She has no rales.  GI: Soft. Bowel sounds are normal. There  is no abdominal tenderness.  Musculoskeletal:     Right ankle: She exhibits swelling.     Left ankle: She exhibits swelling.  Lymphadenopathy:    She has no cervical adenopathy.  Neurological: She is alert. No cranial nerve deficit.  Slight tremor right extremity greater than left  Skin: Skin is warm. No rash noted. Nails show no clubbing.  Psychiatric: She has a normal mood and affect.      Data Reviewed: Basic Metabolic Panel: Recent Labs  Lab 06/10/18 1302 06/11/18 0431  NA 138 136  K 4.5 4.8  CL 101 102  CO2 26 25  GLUCOSE 100* 239*  BUN 23 26*  CREATININE 1.57* 1.61*  CALCIUM 9.9 10.0   Liver Function Tests: Recent Labs  Lab 06/10/18 1302 06/11/18 0431  AST 121* 110*  ALT 65* 68*  ALKPHOS 500* 482*  BILITOT 0.6 0.5  PROT 7.3 7.4  ALBUMIN 2.6* 2.5*   Recent Labs  Lab 06/10/18 2124  AMMONIA 20   CBC: Recent Labs  Lab 06/10/18 1302 06/11/18 0431  WBC 5.1 3.7*  NEUTROABS 3.1  --   HGB 9.8* 10.1*  HCT 31.5* 32.0*  MCV 86.5 85.1  PLT 330 329   BNP (last 3 results) Recent Labs    11/29/17 1700 02/24/18 1837 04/13/18 1317  BNP 153.0* 78.0 99.0    CBG: Recent Labs  Lab 06/10/18 2055 06/11/18 0751 06/11/18 1157  GLUCAP 120* 181* 292*    Recent Results (from the past 240 hour(s))  Urine  culture     Status: Abnormal (Preliminary result)   Collection Time: 06/10/18 12:57 PM  Result Value Ref Range Status   Specimen Description   Final    URINE, CATHETERIZED Performed at [facility], [address] [place], [place] [address]    Special Requests   Final    NONE Performed at [facility], [place], [place] [address]    Culture 60,000 COLONIES/mL ENTEROCOCCUS FAECIUM (A)  Final   Report Status PENDING  Incomplete  Blood Culture (routine x 2)     Status: None (Preliminary result)   Collection Time: 06/10/18  2:30 PM  Result Value Ref Range Status   Specimen Description BLOOD BLOOD LEFT FOREARM  Final    Special Requests   Final    BOTTLES DRAWN AEROBIC AND ANAEROBIC Blood Culture results may not be optimal due to an excessive volume of blood received in culture bottles   Culture   Final    NO GROWTH < 24 HOURS Performed at [facility], [address] [place],  [address]    Report Status PENDING  Incomplete  SARS Coronavirus 2 [facility] order, Performed in [place] hospital lab)     Status: None   Collection Time: 06/10/18  2:30 PM  Result Value Ref Range Status   SARS Coronavirus 2 NEGATIVE NEGATIVE Final    Comment: (NOTE) If result is NEGATIVE SARS-CoV-2 target nucleic acids are NOT DETECTED. The SARS-CoV-2 RNA is generally detectable in upper and lower  respiratory specimens during the acute phase of infection. The lowest  concentration of SARS-CoV-2 viral copies this assay can detect is 250  copies / mL. A negative result does not preclude SARS-CoV-2 infection  and should not be used as the sole basis for treatment or other  patient management decisions.  A negative result may occur with  improper specimen collection / handling, submission of specimen other  than nasopharyngeal swab, presence of viral mutation(s) within the  areas targeted by this assay, and inadequate number of viral copies  (<250 copies / mL). A negative result must be combined with clinical  observations, patient history, and epidemiological information. If result is POSITIVE SARS-CoV-2 target nucleic acids are DETECTED. The SARS-CoV-2 RNA is generally detectable in upper and lower  respiratory specimens dur ing the acute phase of infection.  Positive  results are indicative of active infection with SARS-CoV-2.  Clinical  correlation with patient history and other diagnostic information is  necessary to determine patient infection status.  Positive results do  not rule out bacterial infection or co-infection with other viruses. If result is PRESUMPTIVE POSTIVE SARS-CoV-2 nucleic  acids MAY BE PRESENT.   A presumptive positive result was obtained on the submitted specimen  and confirmed on repeat testing.  While 2019 novel coronavirus  (SARS-CoV-2) nucleic acids may be present in the submitted sample  additional confirmatory testing may be necessary for epidemiological  and / or clinical management purposes  to differentiate between  SARS-CoV-2 and other Sarbecovirus currently known to infect humans.  If clinically indicated additional testing with an alternate test  methodology [phone]) is advised. The SARS-CoV-2 RNA is generally  detectable in upper and lower respiratory sp ecimens during the acute  phase of infection. The expected result is Negative. Fact Sheet for Patients:  StrictlyIdeas.no Fact Sheet for Healthcare Providers: BankingDealers.co.za This test is not yet approved or cleared by the [place] FDA and has been authorized for detection and/or diagnosis of SARS-CoV-2 by  FDA under an Emergency Use Authorization (EUA).  This EUA will remain in effect (meaning this test can be used) for the duration of the COVID-19 declaration under Section 564(b)(1) of the Act, 21 U.S.C. section 360bbb-3(b)(1), unless the authorization is terminated or revoked sooner. Performed at [facility], [address] [place], [place] [address]   Blood Culture (routine x 2)     Status: None (Preliminary result)   Collection Time: 06/10/18  2:38 PM  Result Value Ref Range Status   Specimen Description BLOOD RIGHT ANTECUBITAL  Final   Special Requests   Final    BOTTLES DRAWN AEROBIC AND ANAEROBIC Blood Culture adequate volume   Culture   Final    NO GROWTH < 24 HOURS Performed at [facility], [address], [place], [place] [address]    Report Status PENDING  Incomplete     Studies: Dg Chest 1 View  Result Date: 06/11/2018 CLINICAL DATA:  Shortness of breath. EXAM: CHEST  1 VIEW COMPARISON:   One-view chest x-ray 06/10/2018 FINDINGS: Heart is enlarged. Atherosclerotic calcifications are noted at the aortic arch. Multiple linear opacities are similar the prior study. Lung volumes have decreased slightly. No significant airspace consolidation is present. IMPRESSION: 1. Similar appearance of linear opacities likely reflecting atelectasis. 2. Decreasing lung volumes. Electronically Signed   By: San Morelle M.D.   On: 06/11/2018 08:10   Dg Chest Portable 1 View  Result Date: 06/10/2018 CLINICAL DATA:  Acute mental status change. EXAM: PORTABLE CHEST 1 VIEW COMPARISON:  April 13, 2018 FINDINGS: The heart size borderline. The hila and mediastinum are unremarkable. Streaky opacity in left lung, particularly the base, is identified and new. Mild opacity also seen in the right base, new as well. No other acute abnormalities. IMPRESSION: Bilateral streaky opacities identified. The opacities could represent atelectasis or developing infiltrate. Recommend clinical correlation and attention on follow-up. Electronically Signed   By: Dorise Bullion III M.D   On: 06/10/2018 13:55   US Abdomen Limited Ruq  Result Date: 06/11/2018 CLINICAL DATA:  Elevated liver enzymes. EXAM: ULTRASOUND ABDOMEN LIMITED RIGHT UPPER QUADRANT COMPARISON:  CT 05/04/2017 FINDINGS: Gallbladder: Previous cholecystectomy. Common bile duct: Diameter: 4 mm. Liver: No focal lesion identified. Somewhat coarse parenchymal echogenicity with suggestion of nodular contour. Portal vein is patent on color Doppler imaging with normal direction of blood flow towards the liver. IMPRESSION: No acute findings.  Previous cholecystectomy. Suggestion of hepatic steatosis without focal mass. Electronically Signed   By: Marin Olp M.D.   On: 06/11/2018 09:06    Scheduled Meds: . atorvastatin  80 mg Oral QPM  . clopidogrel  75 mg Oral Daily  . enoxaparin (LOVENOX) injection  40 mg Subcutaneous Q12H  . FLUoxetine  40 mg Oral Daily  .  fluticasone furoate-vilanterol  1 puff Inhalation Daily   And  . umeclidinium bromide  1 puff Inhalation Daily  . gabapentin  300 mg Oral QHS  . insulin aspart  0-5 Units Subcutaneous QHS  . insulin aspart  0-9 Units Subcutaneous TID WC  . ipratropium  2 spray Each Nare BID  . ipratropium-albuterol  3 mL Nebulization Q6H  . levETIRAcetam  500 mg Oral Daily  . loratadine  10 mg Oral Daily  . methylPREDNISolone (SOLU-MEDROL) injection  60 mg Intravenous Q12H  . pantoprazole  40 mg Oral Daily   Continuous Infusions: . ceFEPime (MAXIPIME) IV 2 g (06/11/18 0446)  . [START ON 06/12/2018] vancomycin      Assessment/Plan:  1. Acute  on chronic hypoxic respiratory failure.  Cannot rule out pneumonia on initial chest x-ray.  Next chest x-ray did not comment on pneumonia.  I will continue antibiotics at this time.  Patient chronically wears oxygen. 2. COPD exacerbation.  On Solu-Medrol decrease dose down to daily dosing continue nebulizer treatments. 3. Acute cystitis.  Enterococcus growing out of cultures.  On vancomycin.  Hopefully can switch over to Augmentin upon discharge. 4. Acute metabolic encephalopathy.  Seems improved today. 5. Elevated liver function test.  Trending better. 6. Type 2 diabetes.  On sliding scale.  Restart oral meds 7. Chronic kidney disease stage III 8. History of seizures on Keppra 9. Depression on Prozac 10. Morbid obesity with a BMI of 48.06.  Weight loss needed 11. Weakness.  Physical therapy recommends going back to rehab  Code Status:     Code Status Orders  (From admission, onward)         Start     Ordered   06/10/18 2047  Full code  Continuous     06/10/18 2046        Code Status History    Date Active Date Inactive Code Status Order ID Comments User Context   04/28/2018 1053 04/29/2018 2305 Full Code [id number]  Epifanio Lesches, MD ED   04/13/2018 2359 04/15/2018 2116 Full Code [id number]  Arta Silence, MD Inpatient   02/24/2018 2221  02/28/2018 1815 Full Code [id number]  Dustin Flock, MD Inpatient   12/25/2017 1653 12/26/2017 2152 Full Code [id number]  Gorden Harms, MD Inpatient   11/29/2017 2119 12/05/2017 1841 Full Code [id number]  Salary, Avel Peace, MD Inpatient   11/22/2017 0408 11/26/2017 1804 Full Code [id number]  Arta Silence, MD Inpatient   11/10/2017 1840 11/13/2017 1653 Full Code [id number]  Sela Hua, MD Inpatient   10/24/2017 0247 10/29/2017 2301 Full Code [id number]  Lance Coon, MD Inpatient   10/04/2017 1005 10/10/2017 1813 Full Code [id number]  Marliss Coots, PA-C Inpatient   10/01/2017 0819 10/04/2017 0904 Full Code [id number]  Lolita Cram, RN Inpatient   09/19/2017 1306 09/22/2017 1622 Full Code [id number]  Gorden Harms, MD Inpatient   09/11/2017 2021 09/15/2017 1510 Full Code [id number]  Gorden Harms, MD Inpatient   08/27/2017 1543 08/29/2017 2159 Full Code [id number]  Hillary Bow, MD ED   06/28/2017 1414 06/30/2017 1748 Full Code [id number]  Gorden Harms, MD Inpatient   06/12/2017 0858 06/16/2017 1659 DNR [id number]  Demetrios Loll, MD Inpatient   05/26/2017 1732 05/30/2017 1446 DNR [id number]  Hillary Bow, MD ED   05/17/2017 1228 05/21/2017 1511 DNR [id number]  Asencion Gowda, NP Inpatient   04/21/2017 0117 04/22/2017 1750 DNR [id number]  Lance Coon, MD Inpatient   04/05/2017 1411 04/08/2017 2101 Full Code [id number]  Saundra Shelling, MD Inpatient   02/08/2016 1414 02/11/2016 1729 Full Code [id number]  Hessie Knows, MD Inpatient   11/27/2015 1645 11/29/2015 1628 Full Code [id number]  Idelle Crouch, MD Inpatient   02/10/2015 2122 02/12/2015 1809 Full Code [id number]  Lytle Butte, MD ED   09/16/2014 1220 09/19/2014 1736 Full Code [id number]  Aldean Jewett, MD Inpatient     Family Communication: Spoke with husband on the phone Disposition Plan: To be determined  Antibiotics:  Vancomycin  Maxipime  Time spent: 28 minutes  [place]

## 2018-06-12 LAB — GLUCOSE, CAPILLARY
Glucose-Capillary: 105 mg/dL — ABNORMAL HIGH (ref 70–99)
Glucose-Capillary: 131 mg/dL — ABNORMAL HIGH (ref 70–99)
Glucose-Capillary: 177 mg/dL — ABNORMAL HIGH (ref 70–99)
Glucose-Capillary: 160 mg/dL — ABNORMAL HIGH (ref 70–99)

## 2018-06-12 LAB — URIC ACID: Uric Acid, Serum: 8.8 mg/dL — ABNORMAL HIGH (ref 2.5–7.1)

## 2018-06-12 LAB — BASIC METABOLIC PANEL
Anion gap: 8 (ref 5–15)
BUN: 31 mg/dL — ABNORMAL HIGH (ref 8–23)
CO2: 23 mmol/L (ref 22–32)
Calcium: 9.7 mg/dL (ref 8.9–10.3)
Chloride: 105 mmol/L (ref 98–111)
Creatinine, Ser: 1.53 mg/dL — ABNORMAL HIGH (ref 0.44–1.00)
GFR calc Af Amer: 38 mL/min — ABNORMAL LOW (ref >60)
GFR calc non Af Amer: 33 mL/min — ABNORMAL LOW (ref >60)
Glucose, Bld: 97 mg/dL (ref 70–99)
Potassium: 4.5 mmol/L (ref 3.5–5.1)
Sodium: 136 mmol/L (ref 135–145)

## 2018-06-12 LAB — HEPATITIS PANEL, ACUTE
HCV Ab: <0.1 s/co ratio (ref 0.0–0.9)
Hep A IgM: NEGATIVE
Hep B C IgM: NEGATIVE
Hepatitis B Surface Ag: NEGATIVE

## 2018-06-12 MED ORDER — METHYLPREDNISOLONE SODIUM SUCC 40 MG IJ SOLR
40.0000 mg | Freq: Every day | INTRAMUSCULAR | Status: DC
  Administered 2018-06-12 – 2018-06-13 (×2): 40 mg via INTRAVENOUS
  Filled 2018-06-12 (×2): qty 1

## 2018-06-12 MED ORDER — COLCHICINE 0.6 MG PO TABS
0.6000 mg | ORAL_TABLET | Freq: Every day | ORAL | Status: DC
  Administered 2018-06-12 – 2018-06-13 (×2): 0.6 mg via ORAL
  Filled 2018-06-12 (×2): qty 1

## 2018-06-12 MED ORDER — AMOXICILLIN-POT CLAVULANATE 875-125 MG PO TABS
1.0000 | ORAL_TABLET | Freq: Two times a day (BID) | ORAL | Status: DC
  Administered 2018-06-12 – 2018-06-13 (×3): 1 via ORAL
  Filled 2018-06-12 (×3): qty 1

## 2018-06-12 MED ORDER — ORAL CARE MOUTH RINSE
15.0000 mL | Freq: Two times a day (BID) | OROMUCOSAL | Status: DC
  Administered 2018-06-12 – 2018-06-13 (×2): 15 mL via OROMUCOSAL

## 2018-06-12 MED ORDER — INSULIN ASPART 100 UNIT/ML ~~LOC~~ SOLN
2.0000 [IU] | Freq: Three times a day (TID) | SUBCUTANEOUS | Status: DC
  Administered 2018-06-12 – 2018-06-13 (×2): 2 [IU] via SUBCUTANEOUS
  Filled 2018-06-12 (×2): qty 1

## 2018-06-12 NOTE — Progress Notes (Signed)
Physical Therapy Treatment Patient Details Name: Katherine [name] MRN: [id number] DOB: Aug 30, 1940 Today's Date: 06/12/2018    History of Present Illness 78 y/o F who presented with SOB and admitted with respiratory failure.  Pt on 2L O2 crhonically.  Pt's PMH includes COPD, seizures, stroke, CHF, R TKA.    PT Comments    Pt reports being unable to stand without significant assist (tried yesterday) but agreeable to ex's in bed to improve strength (in order to improve ability to stand).   Will continue to focus on strengthening and progressive functional mobility during hospital stay.   Follow Up Recommendations  SNF;Supervision/Assistance - 24 hour     Equipment Recommendations  None recommended by PT    Recommendations for Other Services       Precautions / Restrictions Precautions Precautions: Fall Restrictions Weight Bearing Restrictions: No    Mobility  Bed Mobility                  Transfers                    Ambulation/Gait                 Stairs             Wheelchair Mobility    Modified Rankin (Stroke Patients Only)       Balance                                            Cognition Arousal/Alertness: Awake/alert Behavior During Therapy: WFL for tasks assessed/performed Overall Cognitive Status: Within Functional Limits for tasks assessed                                        Exercises General Exercises - Lower Extremity Ankle Circles/Pumps: AROM;Strengthening;Both;Supine(10 reps x2) Quad Sets: AROM;Strengthening;Both;Supine(10 reps x2) Short Arc Quad: AROM;Strengthening;Both;Supine(10 reps x2) Heel Slides: AAROM;Strengthening;Both;Supine(10 reps x2) Hip ABduction/ADduction: AROM;Strengthening;Both;Supine(10 reps x2) Straight Leg Raises: AAROM;Strengthening;Both;Supine(10 reps x2)    General Comments   Nursing cleared pt for participation in physical therapy.  Pt agreeable to PT  session.      Pertinent Vitals/Pain Pain Assessment: 0-10 Pain Score: 8  Pain Location: L hand Pain Descriptors / Indicators: Tender Pain Intervention(s): Limited activity within patient's tolerance;Monitored during session;Repositioned  Vitals (HR and O2 on 2 L via nasal cannula) stable and WFL throughout treatment session.    Home Living                      Prior Function            PT Goals (current goals can now be found in the care plan section) Acute Rehab PT Goals Patient Stated Goal: go home PT Goal Formulation: With patient Time For Goal Achievement: 06/25/18 Potential to Achieve Goals: Fair Progress towards PT goals: Progressing toward goals    Frequency    Min 2X/week      PT Plan Current plan remains appropriate    Co-evaluation              AM-PAC PT "6 Clicks" Mobility   Outcome Measure  Help needed turning from your back to your side while in a flat bed without using bedrails?: A Lot Help needed moving from lying  on your back to sitting on the side of a flat bed without using bedrails?: Total Help needed moving to and from a bed to a chair (including a wheelchair)?: Total Help needed standing up from a chair using your arms (e.g., wheelchair or bedside chair)?: Total Help needed to walk in hospital room?: Total Help needed climbing 3-5 steps with a railing? : Total 6 Click Score: 7    End of Session Equipment Utilized During Treatment: Oxygen Activity Tolerance: Patient tolerated treatment well Patient left: in bed;with call bell/phone within reach;with bed alarm set;Other (comment)(B heels floating via pillow) Nurse Communication: Mobility status;Precautions PT Visit Diagnosis: Muscle weakness (generalized) (M62.81);Difficulty in walking, not elsewhere classified (R26.2);Unsteadiness on feet (R26.81)     Time: 5956-3875 PT Time Calculation (min) (ACUTE ONLY): 23 min  Charges:  $Therapeutic Exercise: 23-37 mins                      Leitha Bleak, PT 06/12/18, 4:20 PM [phone]

## 2018-06-12 NOTE — Progress Notes (Signed)
Patient ID: Katherine [name], female   DOB: 1940/11/14, 78 y.o.   MRN: [id number]  [facility] PROGRESS NOTE  Katherine [name] OVZ:[id number] DOB: [birth year] DOA: 06/10/2018 PCP: Park Liter P, DO  HPI/Subjective: Patient seen this morning and she stated she felt horrible.  Having pain in her left hand all night.  Vomited last night.  States her breathing is a little bit better.  Objective: Vitals:   06/12/18 1047 06/12/18 1358  BP: 102/66   Pulse: 86   Resp: 18   Temp: 97.7 F (36.5 C)   SpO2: 97% 96%    Filed Weights   06/10/18 1251  Weight: 127 kg    ROS: Review of Systems  Constitutional: Negative for chills and fever.  Eyes: Negative for blurred vision.  Respiratory: Positive for shortness of breath. Negative for cough.   Cardiovascular: Negative for chest pain.  Gastrointestinal: Negative for abdominal pain, constipation, diarrhea, nausea and vomiting.  Genitourinary: Negative for dysuria.  Musculoskeletal: Negative for joint pain.  Neurological: Positive for tremors. Negative for dizziness and headaches.   Exam: Physical Exam  HENT:  Nose: No mucosal edema.  Mouth/Throat: No oropharyngeal exudate or posterior oropharyngeal edema.  Eyes: Pupils are equal, round, and reactive to light. Conjunctivae, EOM and lids are normal.  Neck: No JVD present. Carotid bruit is not present. No edema present. No thyroid mass and no thyromegaly present.  Cardiovascular: S1 normal and S2 normal. Exam reveals no gallop.  No murmur heard. Pulses:      Dorsalis pedis pulses are 2+ on the right side and 2+ on the left side.  Respiratory: No respiratory distress. She has decreased breath sounds in the right lower field and the left lower field. She has no wheezes. She has no rhonchi. She has no rales.  GI: Soft. Bowel sounds are normal. There is no abdominal tenderness.  Musculoskeletal:     Right ankle: She exhibits swelling.     Left ankle: She exhibits swelling.   Lymphadenopathy:    She has no cervical adenopathy.  Neurological: She is alert. No cranial nerve deficit.  Slight tremor right extremity greater than left  Skin: Skin is warm. No rash noted. Nails show no clubbing.  Psychiatric: She has a normal mood and affect.      Data Reviewed: Basic Metabolic Panel: Recent Labs  Lab 06/10/18 1302 06/11/18 0431 06/12/18 0859  NA 138 136 136  K 4.5 4.8 4.5  CL 101 102 105  CO2 26 25 23   GLUCOSE 100* 239* 97  BUN 23 26* 31*  CREATININE 1.57* 1.61* 1.53*  CALCIUM 9.9 10.0 9.7   Liver Function Tests: Recent Labs  Lab 06/10/18 1302 06/11/18 0431  AST 121* 110*  ALT 65* 68*  ALKPHOS 500* 482*  BILITOT 0.6 0.5  PROT 7.3 7.4  ALBUMIN 2.6* 2.5*   Recent Labs  Lab 06/10/18 2124  AMMONIA 20   CBC: Recent Labs  Lab 06/10/18 1302 06/11/18 0431  WBC 5.1 3.7*  NEUTROABS 3.1  --   HGB 9.8* 10.1*  HCT 31.5* 32.0*  MCV 86.5 85.1  PLT 330 329   BNP (last 3 results) Recent Labs    11/29/17 1700 02/24/18 1837 04/13/18 1317  BNP 153.0* 78.0 99.0    CBG: Recent Labs  Lab 06/11/18 1157 06/11/18 1634 06/11/18 2110 06/12/18 0757 06/12/18 1214  GLUCAP 292* 188* 156* 105* 131*    Recent Results (from the past 240 hour(s))  Urine culture     Status: Abnormal (Preliminary result)  Collection Time: 06/10/18 12:57 PM  Result Value Ref Range Status   Specimen Description   Final    URINE, CATHETERIZED Performed at [facility], [address], [place], [place] [address]    Special Requests   Final    NONE Performed at [facility], [place], [place] [address]    Culture (A)  Final    60,000 COLONIES/mL ENTEROCOCCUS FAECIUM CULTURE REINCUBATED FOR BETTER GROWTH Performed at [facility], [place] [address] [place], [place] [address]    Report Status PENDING  Incomplete  Blood Culture (routine x 2)     Status: None (Preliminary result)   Collection Time: 06/10/18  2:30 PM   Result Value Ref Range Status   Specimen Description BLOOD BLOOD LEFT FOREARM  Final   Special Requests   Final    BOTTLES DRAWN AEROBIC AND ANAEROBIC Blood Culture results may not be optimal due to an excessive volume of blood received in culture bottles   Culture   Final    NO GROWTH 2 DAYS Performed at [facility], [address], [place], [place] [address]    Report Status PENDING  Incomplete  SARS Coronavirus 2 [facility] order, Performed in [place] hospital lab)     Status: None   Collection Time: 06/10/18  2:30 PM  Result Value Ref Range Status   SARS Coronavirus 2 NEGATIVE NEGATIVE Final    Comment: (NOTE) If result is NEGATIVE SARS-CoV-2 target nucleic acids are NOT DETECTED. The SARS-CoV-2 RNA is generally detectable in upper and lower  respiratory specimens during the acute phase of infection. The lowest  concentration of SARS-CoV-2 viral copies this assay can detect is 250  copies / mL. A negative result does not preclude SARS-CoV-2 infection  and should not be used as the sole basis for treatment or other  patient management decisions.  A negative result may occur with  improper specimen collection / handling, submission of specimen other  than nasopharyngeal swab, presence of viral mutation(s) within the  areas targeted by this assay, and inadequate number of viral copies  (<250 copies / mL). A negative result must be combined with clinical  observations, patient history, and epidemiological information. If result is POSITIVE SARS-CoV-2 target nucleic acids are DETECTED. The SARS-CoV-2 RNA is generally detectable in upper and lower  respiratory specimens dur ing the acute phase of infection.  Positive  results are indicative of active infection with SARS-CoV-2.  Clinical  correlation with patient history and other diagnostic information is  necessary to determine patient infection status.  Positive results do  not rule out bacterial infection or  co-infection with other viruses. If result is PRESUMPTIVE POSTIVE SARS-CoV-2 nucleic acids MAY BE PRESENT.   A presumptive positive result was obtained on the submitted specimen  and confirmed on repeat testing.  While 2019 novel coronavirus  (SARS-CoV-2) nucleic acids may be present in the submitted sample  additional confirmatory testing may be necessary for epidemiological  and / or clinical management purposes  to differentiate between  SARS-CoV-2 and other Sarbecovirus currently known to infect humans.  If clinically indicated additional testing with an alternate test  methodology [phone]) is advised. The SARS-CoV-2 RNA is generally  detectable in upper and lower respiratory sp ecimens during the acute  phase of infection. The expected result is Negative. Fact Sheet for Patients:  StrictlyIdeas.no Fact Sheet for Healthcare Providers: BankingDealers.co.za This test is not yet approved or cleared by the [place] FDA and  has been authorized for detection and/or diagnosis of SARS-CoV-2 by FDA under an Emergency Use Authorization (EUA).  This EUA will remain in effect (meaning this test can be used) for the duration of the COVID-19 declaration under Section 564(b)(1) of the Act, 21 U.S.C. section 360bbb-3(b)(1), unless the authorization is terminated or revoked sooner. Performed at [facility], [address] [place], [place] [address]   Blood Culture (routine x 2)     Status: None (Preliminary result)   Collection Time: 06/10/18  2:38 PM  Result Value Ref Range Status   Specimen Description BLOOD RIGHT ANTECUBITAL  Final   Special Requests   Final    BOTTLES DRAWN AEROBIC AND ANAEROBIC Blood Culture adequate volume   Culture   Final    NO GROWTH 2 DAYS Performed at [facility] [address] [place], [place] [address]    Report Status PENDING  Incomplete  MRSA PCR Screening     Status: Abnormal    Collection Time: 06/11/18  8:01 PM  Result Value Ref Range Status   MRSA by PCR POSITIVE (A) NEGATIVE Final    Comment:        The GeneXpert MRSA Assay (FDA approved for NASAL specimens only), is one component of a comprehensive MRSA colonization surveillance program. It is not intended to diagnose MRSA infection nor to guide or monitor treatment for MRSA infections. RESULT CALLED TO, READ BACK BY AND VERIFIED WITH: JAKKI PAGE AT 2136 06/11/2018.  TFK Performed at [facility], [address] [place], [place] [address]      Studies: Dg Chest 1 View  Result Date: 06/11/2018 CLINICAL DATA:  Shortness of breath. EXAM: CHEST  1 VIEW COMPARISON:  One-view chest x-ray 06/10/2018 FINDINGS: Heart is enlarged. Atherosclerotic calcifications are noted at the aortic arch. Multiple linear opacities are similar the prior study. Lung volumes have decreased slightly. No significant airspace consolidation is present. IMPRESSION: 1. Similar appearance of linear opacities likely reflecting atelectasis. 2. Decreasing lung volumes. Electronically Signed   By: San Morelle M.D.   On: 06/11/2018 08:10   US Abdomen Limited Ruq  Result Date: 06/11/2018 CLINICAL DATA:  Elevated liver enzymes. EXAM: ULTRASOUND ABDOMEN LIMITED RIGHT UPPER QUADRANT COMPARISON:  CT 05/04/2017 FINDINGS: Gallbladder: Previous cholecystectomy. Common bile duct: Diameter: 4 mm. Liver: No focal lesion identified. Somewhat coarse parenchymal echogenicity with suggestion of nodular contour. Portal vein is patent on color Doppler imaging with normal direction of blood flow towards the liver. IMPRESSION: No acute findings.  Previous cholecystectomy. Suggestion of hepatic steatosis without focal mass. Electronically Signed   By: Marin Olp M.D.   On: 06/11/2018 09:06    Scheduled Meds: . amoxicillin-clavulanate  1 tablet Oral Q12H  . atorvastatin  80 mg Oral QPM  . Chlorhexidine Gluconate Cloth  6 each Topical Q0600   . clopidogrel  75 mg Oral Daily  . colchicine  0.6 mg Oral Daily  . enoxaparin (LOVENOX) injection  40 mg Subcutaneous Q12H  . FLUoxetine  40 mg Oral Daily  . fluticasone furoate-vilanterol  1 puff Inhalation Daily   And  . umeclidinium bromide  1 puff Inhalation Daily  . gabapentin  300 mg Oral QHS  . insulin aspart  0-5 Units Subcutaneous QHS  . insulin aspart  0-9 Units Subcutaneous TID WC  . ipratropium  2 spray Each Nare BID  . ipratropium-albuterol  3 mL Nebulization Q6H  . levETIRAcetam  500 mg Oral Daily  . loratadine  10 mg Oral Daily  .  methylPREDNISolone (SOLU-MEDROL) injection  40 mg Intravenous Daily  . mupirocin ointment  1 application Nasal BID  . pantoprazole  40 mg Oral Daily   Continuous Infusions:   Assessment/Plan:  1. Acute on chronic hypoxic respiratory failure.  Cannot rule out pneumonia on initial chest x-ray.  Next chest x-ray did not comment on pneumonia.  Patient chronically wears oxygen. 2. COPD exacerbation.  Continue Solu-Medrol and nebulizer treatments 3. Acute cystitis.  Enterococcus growing out of cultures.  Switched over to Augmentin. 4. Acute metabolic encephalopathy.  Seems improved today. 5. Elevated liver function test.  Trending better. 6. Type 2 diabetes.  On sliding scale.  Restart oral meds 7. Chronic kidney disease stage III 8. History of seizures on Keppra 9. Depression on Prozac 10. Morbid obesity with a BMI of 48.06.  Weight loss needed 11. Weakness.  Physical therapy recommends going back to rehab 12. Nausea vomiting this morning.  Continue to monitor 13. Left hand pain could be gout.  Solu-Medrol should help.  Patient already on colchicine  Code Status:     Code Status Orders  (From admission, onward)         Start     Ordered   06/10/18 2047  Full code  Continuous     06/10/18 2046        Code Status History    Date Active Date Inactive Code Status Order ID Comments User Context   04/28/2018 1053 04/29/2018 2305  Full Code [id number]  Epifanio Lesches, MD ED   04/13/2018 2359 04/15/2018 2116 Full Code [id number]  Arta Silence, MD Inpatient   02/24/2018 2221 02/28/2018 1815 Full Code [id number]  Dustin Flock, MD Inpatient   12/25/2017 1653 12/26/2017 2152 Full Code [id number]  Salary, Avel Peace, MD Inpatient   11/29/2017 2119 12/05/2017 1841 Full Code [id number]  Salary, Avel Peace, MD Inpatient   11/22/2017 0408 11/26/2017 1804 Full Code [id number]  Arta Silence, MD Inpatient   11/10/2017 1840 11/13/2017 1653 Full Code [id number]  Mayo, Pete Pelt, MD Inpatient   10/24/2017 0247 10/29/2017 2301 Full Code [id number]  Lance Coon, MD Inpatient   10/04/2017 1005 10/10/2017 1813 Full Code [id number]  Marliss Coots, PA-C Inpatient   10/01/2017 0819 10/04/2017 0904 Full Code [id number]  Lolita Cram, RN Inpatient   09/19/2017 1306 09/22/2017 1622 Full Code [id number]  Gorden Harms, MD Inpatient   09/11/2017 2021 09/15/2017 1510 Full Code [id number]  Gorden Harms, MD Inpatient   08/27/2017 1543 08/29/2017 2159 Full Code [id number]  Hillary Bow, MD ED   06/28/2017 1414 06/30/2017 1748 Full Code [id number]  Salary, Avel Peace, MD Inpatient   06/12/2017 0858 06/16/2017 1659 DNR [id number]  Demetrios Loll, MD Inpatient   05/26/2017 1732 05/30/2017 1446 DNR [id number]  Hillary Bow, MD ED   05/17/2017 1228 05/21/2017 1511 DNR [id number]  Asencion Gowda, NP Inpatient   04/21/2017 0117 04/22/2017 1750 DNR [id number]  Lance Coon, MD Inpatient   04/05/2017 1411 04/08/2017 2101 Full Code [id number]  Saundra Shelling, MD Inpatient   02/08/2016 1414 02/11/2016 1729 Full Code [id number]  Hessie Knows, MD Inpatient   11/27/2015 1645 11/29/2015 1628 Full Code [id number]  Idelle Crouch, MD Inpatient   02/10/2015 2122 02/12/2015 1809 Full Code [id number]  Lytle Butte, MD ED   09/16/2014 1220 09/19/2014 1736 Full Code [id number]  Aldean Jewett, MD Inpatient     Family Communication: Spoke with husband on the phone yesterday Disposition  Plan: Reevaluate tomorrow for potential disposition  Antibiotics:  Augmentin  Time spent: 27 minutes  [place]

## 2018-06-12 NOTE — Progress Notes (Signed)
Inpatient Diabetes Program Recommendations  AACE/ADA: New Consensus Statement on Inpatient Glycemic Control  Target Ranges:  Prepandial:   less than 140 mg/dL      Peak postprandial:   less than 180 mg/dL (1-2 hours)      Critically ill patients:  140 - 180 mg/dL  Results for Katherine [name], Katherine [name] (MRN [id number]) as of 06/12/2018 10:01  Ref. Range 06/11/2018 07:51 06/11/2018 11:57 06/11/2018 16:34 06/11/2018 21:10 06/12/2018 07:57  Glucose-Capillary Latest Ref Range: 70 - 99 mg/dL 181 (H) 292 (H) 188 (H) 156 (H) 105 (H)    Review of Glycemic Control  Diabetes history: DM2 Outpatient Diabetes medications: Tradjenta 5 mg daily Current orders for Inpatient glycemic control: Novolog 0-9 units TID with meals, Novolog 0-5 units QHS; Solumedrol 40 mg daily  Inpatient Diabetes Program Recommendations:   Insulin - Meal Coverage: If steroids are continued, please consider ordering Novolog 2 units TID with meals for meal coverage if patient eats at least 50% of meals.  Thanks, Barnie Alderman, RN, MSN, CDE Diabetes Coordinator Inpatient Diabetes Program [phone] (Team Pager from 8am to 5pm)

## 2018-06-13 DIAGNOSIS — I2510 Atherosclerotic heart disease of native coronary artery without angina pectoris: Secondary | ICD-10-CM | POA: Diagnosis not present

## 2018-06-13 DIAGNOSIS — W19XXXA Unspecified fall, initial encounter: Secondary | ICD-10-CM | POA: Diagnosis not present

## 2018-06-13 DIAGNOSIS — N3000 Acute cystitis without hematuria: Secondary | ICD-10-CM | POA: Diagnosis not present

## 2018-06-13 DIAGNOSIS — G4089 Other seizures: Secondary | ICD-10-CM | POA: Diagnosis not present

## 2018-06-13 DIAGNOSIS — E1159 Type 2 diabetes mellitus with other circulatory complications: Secondary | ICD-10-CM | POA: Diagnosis not present

## 2018-06-13 DIAGNOSIS — D649 Anemia, unspecified: Secondary | ICD-10-CM | POA: Diagnosis not present

## 2018-06-13 DIAGNOSIS — N390 Urinary tract infection, site not specified: Secondary | ICD-10-CM | POA: Diagnosis not present

## 2018-06-13 DIAGNOSIS — J9601 Acute respiratory failure with hypoxia: Secondary | ICD-10-CM | POA: Diagnosis not present

## 2018-06-13 DIAGNOSIS — I509 Heart failure, unspecified: Secondary | ICD-10-CM | POA: Diagnosis not present

## 2018-06-13 DIAGNOSIS — E1165 Type 2 diabetes mellitus with hyperglycemia: Secondary | ICD-10-CM | POA: Diagnosis not present

## 2018-06-13 DIAGNOSIS — Z7401 Bed confinement status: Secondary | ICD-10-CM | POA: Diagnosis not present

## 2018-06-13 DIAGNOSIS — J9621 Acute and chronic respiratory failure with hypoxia: Secondary | ICD-10-CM | POA: Diagnosis not present

## 2018-06-13 DIAGNOSIS — J441 Chronic obstructive pulmonary disease with (acute) exacerbation: Secondary | ICD-10-CM | POA: Diagnosis not present

## 2018-06-13 DIAGNOSIS — G4733 Obstructive sleep apnea (adult) (pediatric): Secondary | ICD-10-CM | POA: Diagnosis not present

## 2018-06-13 DIAGNOSIS — R5381 Other malaise: Secondary | ICD-10-CM | POA: Diagnosis not present

## 2018-06-13 DIAGNOSIS — M109 Gout, unspecified: Secondary | ICD-10-CM | POA: Diagnosis not present

## 2018-06-13 DIAGNOSIS — M2550 Pain in unspecified joint: Secondary | ICD-10-CM | POA: Diagnosis not present

## 2018-06-13 DIAGNOSIS — D519 Vitamin B12 deficiency anemia, unspecified: Secondary | ICD-10-CM | POA: Diagnosis not present

## 2018-06-13 DIAGNOSIS — D638 Anemia in other chronic diseases classified elsewhere: Secondary | ICD-10-CM | POA: Diagnosis not present

## 2018-06-13 DIAGNOSIS — G9341 Metabolic encephalopathy: Secondary | ICD-10-CM | POA: Diagnosis not present

## 2018-06-13 DIAGNOSIS — I5032 Chronic diastolic (congestive) heart failure: Secondary | ICD-10-CM | POA: Diagnosis not present

## 2018-06-13 DIAGNOSIS — J449 Chronic obstructive pulmonary disease, unspecified: Secondary | ICD-10-CM | POA: Diagnosis not present

## 2018-06-13 DIAGNOSIS — J9611 Chronic respiratory failure with hypoxia: Secondary | ICD-10-CM | POA: Diagnosis not present

## 2018-06-13 DIAGNOSIS — R531 Weakness: Secondary | ICD-10-CM | POA: Diagnosis not present

## 2018-06-13 LAB — GLUCOSE, CAPILLARY: Glucose-Capillary: 96 mg/dL (ref 70–99)

## 2018-06-13 MED ORDER — LORAZEPAM 2 MG/ML IJ SOLN: 1.0000 mg | Freq: Every day | INTRAMUSCULAR | 0 refills | Status: DC | PRN

## 2018-06-13 MED ORDER — AMOXICILLIN-POT CLAVULANATE 875-125 MG PO TABS: 1.0000 | ORAL_TABLET | Freq: Two times a day (BID) | ORAL | 0 refills | Status: DC

## 2018-06-13 MED ORDER — PREDNISONE 5 MG PO TABS: ORAL_TABLET | ORAL | 0 refills | Status: DC

## 2018-06-13 MED ORDER — COLCHICINE 0.6 MG PO TABS: 0.6000 mg | ORAL_TABLET | Freq: Every day | ORAL | 0 refills | Status: DC

## 2018-06-13 MED ORDER — MUPIROCIN 2 % EX OINT: 1.0000 "application " | TOPICAL_OINTMENT | Freq: Two times a day (BID) | CUTANEOUS | 0 refills | Status: DC

## 2018-06-13 MED ORDER — NYSTATIN 100000 UNIT/ML MT SUSP: 5.0000 mL | Freq: Four times a day (QID) | OROMUCOSAL | 0 refills | Status: DC

## 2018-06-13 MED ORDER — NYSTATIN 100000 UNIT/ML MT SUSP: 5.0000 mL | Freq: Four times a day (QID) | OROMUCOSAL | Status: DC

## 2018-06-13 NOTE — Progress Notes (Signed)
EMS present for pt discharge; discharge packet taken by EMS personnel; pt discharged via stretcher by EMS personnel on 2L [place] portable O2

## 2018-06-13 NOTE — Progress Notes (Signed)
Report called to Ria Comment at [facility]. EMS called for transport. Madlyn Frankel, RN

## 2018-06-13 NOTE — Discharge Summary (Addendum)
[facility] [place] NAME: Katherine [name]    MR#:  [id number]  DATE OF BIRTH:  January 02, 1941  DATE OF ADMISSION:  06/10/2018 ADMITTING PHYSICIAN: Sela Hua, MD  DATE OF DISCHARGE: 06/13/2018  PRIMARY CARE PHYSICIAN: Valerie Roys, DO    ADMISSION DIAGNOSIS:  Acute cystitis with hematuria [N30.01] Sepsis, due to unspecified organism, unspecified whether acute organ dysfunction present ([place]) [A41.9]  DISCHARGE DIAGNOSIS:  Active Problems:   Acute respiratory failure with hypoxia ([place])   SECONDARY DIAGNOSIS:   Past Medical History:  Diagnosis Date  . Angioedema   . Anxiety   . Anxiety and depression   . CHF (congestive heart failure) ([place])   . CHF (congestive heart failure) ([place])   . Chronic constipation   . Chronic kidney disease    stage 3  . COPD (chronic obstructive pulmonary disease) ([place])   . Diabetes mellitus without complication ([place])   . Gallstones   . GERD (gastroesophageal reflux disease)   . Hypertension   . Left ventricular hypertrophy   . Osteoarthritis   . Prurigo nodularis   . Seizures ([place])   . Stroke ([place])   . Tobacco abuse   . Vitamin D deficiency disease     HOSPITAL COURSE:   1.  Acute on chronic hypoxic respiratory failure.  Pneumonia cannot be ruled out on the initial chest x-ray.  The next chest x-ray did not show pneumonia.  The patient chronically wears oxygen 2 L. 2.  COPD exacerbation.  The patient was given Solu-Medrol during the hospital course and nebulizer treatments.  The patient's respiratory status has improved and I am not hearing any wheeze. 3.  Acute cystitis with enterococcus growing out of the cultures.  The patient was initially given vancomycin and then switched over to Augmentin.  Unfortunately the cultures are still pending at this point.  Final sensitivities will be back tomorrow and need to be followed up. 4.  Acute metabolic encephalopathy this has improved from  presentation 5.  Acute gout left hand.  Solu-Medrol should help.  I added colchicine.  Uric acid slightly high and would benefit from starting low-dose allopurinol as outpatient but with the acute gout attack I will not start right now. 6.  Type 2 diabetes mellitus on Tradjenta.  Continue to check fingersticks anytime she feels funny and daily 7.  Chronic kidney disease stage III 8.  History of seizures on Keppra 9.  Depression on Prozac 10.  Elevated liver function test.  These are trending better. 11.  Morbid obesity with a BMI of 48.06.  Weight loss needed 12.  Nausea vomiting yesterday this has resolved 13 thrush.  Nystatin swish and swallow  DISCHARGE CONDITIONS:   Satisfactory  CONSULTS OBTAINED:  None  DRUG ALLERGIES:   Allergies  Allergen Reactions  . Bee Venom Swelling  . Enalapril Maleate Swelling    DISCHARGE MEDICATIONS:   Allergies as of 06/13/2018      Reactions   Bee Venom Swelling   Enalapril Maleate Swelling      Medication List    TAKE these medications   acetaminophen 650 MG CR tablet Commonly known as:  TYLENOL Take 650 mg by mouth 3 (three) times daily.   albuterol 108 (90 Base) MCG/ACT inhaler Commonly known as:  VENTOLIN HFA Inhale 2 puffs into the lungs every 4 (four) hours as needed for wheezing or shortness of breath.   albuterol 0.63 MG/3ML nebulizer solution Commonly known as:  ACCUNEB Take  3 mLs by nebulization every 8 (eight) hours as needed for wheezing.   amoxicillin-clavulanate 875-125 MG tablet Commonly known as:  AUGMENTIN Take 1 tablet by mouth every 12 (twelve) hours.   atorvastatin 80 MG tablet Commonly known as:  LIPITOR Take 80 mg by mouth every evening.   cetirizine 10 MG tablet Commonly known as:  ZYRTEC Take 5 mg by mouth at bedtime.   cholecalciferol 1000 units tablet Commonly known as:  VITAMIN D Take 1,000 Units by mouth daily.   clopidogrel 75 MG tablet Commonly known as:  PLAVIX Take 75 mg by mouth  daily.   colchicine 0.6 MG tablet Take 1 tablet (0.6 mg total) by mouth daily. Start taking on:  Jun 14, 2018   cyanocobalamin 1000 MCG tablet Take 1 tablet (1,000 mcg total) by mouth daily.   diclofenac sodium 1 % Gel Commonly known as:  VOLTAREN Apply 2 g topically every 12 (twelve) hours as needed (for left knee pain).   docusate sodium 100 MG capsule Commonly known as:  COLACE Take 100 mg by mouth 2 (two) times daily.   FLUoxetine 40 MG capsule Commonly known as:  PROZAC Take 40 mg by mouth daily.   Fluticasone-Umeclidin-Vilant 100-62.5-25 MCG/INH Aepb Inhale 1 puff into the lungs daily.   gabapentin 300 MG capsule Commonly known as:  NEURONTIN Take 300 mg by mouth at bedtime.   ipratropium 0.03 % nasal spray Commonly known as:  ATROVENT Place 2 sprays into both nostrils 2 (two) times daily.   ipratropium-albuterol 0.5-2.5 (3) MG/3ML Soln Commonly known as:  DUONEB Take 3 mLs by nebulization 2 (two) times daily.   levETIRAcetam 500 MG 24 hr tablet Commonly known as:  KEPPRA XR Take 1 tablet (500 mg total) by mouth daily.   linagliptin 5 MG Tabs tablet Commonly known as:  TRADJENTA Take 5 mg by mouth daily.   LORazepam 2 MG/ML injection Commonly known as:  ATIVAN Inject 0.5 mLs (1 mg total) into the muscle daily as needed for seizure.   meclizine 25 MG tablet Commonly known as:  ANTIVERT Take 25 mg by mouth every 12 (twelve) hours as needed for dizziness.   multivitamin with minerals Tabs tablet Take 1 tablet by mouth daily.   mupirocin ointment 2 % Commonly known as:  BACTROBAN Place 1 application into the nose 2 (two) times daily.   nystatin 100000 UNIT/ML suspension Commonly known as:  MYCOSTATIN Take 5 mLs (500,000 Units total) by mouth 4 (four) times daily.   pantoprazole 40 MG tablet Commonly known as:  PROTONIX Take 1 tablet (40 mg total) by mouth daily.   predniSONE 5 MG tablet Commonly known as:  DELTASONE 4 tabs po day1; 3 tabs po day  2; 2 tabs po day3; 1 tab po day4; 1/2 tab po day5,6 Start taking on:  Jun 14, 2018        DISCHARGE INSTRUCTIONS:    Follow-up with Dr. at facility 1 day  If you experience worsening of your admission symptoms, develop shortness of breath, life threatening emergency, suicidal or homicidal thoughts you must seek medical attention immediately by calling 911 or calling your MD immediately  if symptoms less severe.  You Must read complete instructions/literature along with all the possible adverse reactions/side effects for all the Medicines you take and that have been prescribed to you. Take any new Medicines after you have completely understood and accept all the possible adverse reactions/side effects.   Please note  You were cared for by a hospitalist  during your hospital stay. If you have any questions about your discharge medications or the care you received while you were in the hospital after you are discharged, you can call the unit and asked to speak with the hospitalist on call if the hospitalist that took care of you is not available. Once you are discharged, your primary care physician will handle any further medical issues. Please note that NO REFILLS for any discharge medications will be authorized once you are discharged, as it is imperative that you return to your primary care physician (or establish a relationship with a primary care physician if you do not have one) for your aftercare needs so that they can reassess your need for medications and monitor your lab values.    Today   CHIEF COMPLAINT:   Chief Complaint  Patient presents with  . Altered Mental Status    HISTORY OF PRESENT ILLNESS:  Aulani Shipton  is a 78 y.o. female sent in with altered mental status   VITAL SIGNS:  Blood pressure (!) 141/78, pulse 81, temperature 97.7 F (36.5 C), resp. rate 16, height 5\' 4"  (1.626 m), weight 127 kg, SpO2 95 %.    PHYSICAL EXAMINATION:  GENERAL:  78 y.o.-year-old  patient lying in the bed with no acute distress.  EYES: Pupils equal, round, reactive to light and accommodation. No scleral icterus. Extraocular muscles intact.  HEENT: Head atraumatic, normocephalic. Oropharynx and nasopharynx clear.  NECK:  Supple, no jugular venous distention. No thyroid enlargement, no tenderness.  LUNGS: Normal breath sounds bilaterally, no wheezing, rales,rhonchi or crepitation. No use of accessory muscles of respiration.  CARDIOVASCULAR: S1, S2 normal. No murmurs, rubs, or gallops.  ABDOMEN: Soft, non-tender, non-distended. Bowel sounds present. No organomegaly or mass.  EXTREMITIES: No pedal edema, cyanosis, or clubbing.  Left hand swelling.  Able to bend her fingers a little bit better today.  Pain to palpation. NEUROLOGIC: Cranial nerves II through XII are intact. Muscle strength 5/5 in all extremities. Sensation intact. Gait not checked.  PSYCHIATRIC: The patient is alert and oriented x 3.  SKIN: No obvious rash, lesion, or ulcer.   DATA REVIEW:   CBC Recent Labs  Lab 06/11/18 0431  WBC 3.7*  HGB 10.1*  HCT 32.0*  PLT 329    Chemistries  Recent Labs  Lab 06/11/18 0431 06/12/18 0859  NA 136 136  K 4.8 4.5  CL 102 105  CO2 25 23  GLUCOSE 239* 97  BUN 26* 31*  CREATININE 1.61* 1.53*  CALCIUM 10.0 9.7  AST 110*  --   ALT 68*  --   ALKPHOS 482*  --   BILITOT 0.5  --      Microbiology Results  Results for orders placed or performed during the hospital encounter of 06/10/18  Urine culture     Status: Abnormal (Preliminary result)   Collection Time: 06/10/18 12:57 PM  Result Value Ref Range Status   Specimen Description   Final    URINE, CATHETERIZED Performed at [facility], [address], [place], [place] [address]    Special Requests   Final    NONE Performed at [facility], [address] [place], [place] [address]    Culture (A)  Final    60,000 COLONIES/mL ENTEROCOCCUS FAECIUM SUSCEPTIBILITIES TO  FOLLOW Performed at [facility], [place] [address], [place], [place] [address]    Report Status PENDING  Incomplete  Blood Culture (routine x 2)     Status: None (Preliminary  result)   Collection Time: 06/10/18  2:30 PM  Result Value Ref Range Status   Specimen Description BLOOD BLOOD LEFT FOREARM  Final   Special Requests   Final    BOTTLES DRAWN AEROBIC AND ANAEROBIC Blood Culture results may not be optimal due to an excessive volume of blood received in culture bottles   Culture   Final    NO GROWTH 3 DAYS Performed at [facility], [address], [place], [place] [address]    Report Status PENDING  Incomplete  SARS Coronavirus 2 [facility] order, Performed in [place] hospital lab)     Status: None   Collection Time: 06/10/18  2:30 PM  Result Value Ref Range Status   SARS Coronavirus 2 NEGATIVE NEGATIVE Final    Comment: (NOTE) If result is NEGATIVE SARS-CoV-2 target nucleic acids are NOT DETECTED. The SARS-CoV-2 RNA is generally detectable in upper and lower  respiratory specimens during the acute phase of infection. The lowest  concentration of SARS-CoV-2 viral copies this assay can detect is 250  copies / mL. A negative result does not preclude SARS-CoV-2 infection  and should not be used as the sole basis for treatment or other  patient management decisions.  A negative result may occur with  improper specimen collection / handling, submission of specimen other  than nasopharyngeal swab, presence of viral mutation(s) within the  areas targeted by this assay, and inadequate number of viral copies  (<250 copies / mL). A negative result must be combined with clinical  observations, patient history, and epidemiological information. If result is POSITIVE SARS-CoV-2 target nucleic acids are DETECTED. The SARS-CoV-2 RNA is generally detectable in upper and lower  respiratory specimens dur ing the acute phase of infection.  Positive  results are indicative of  active infection with SARS-CoV-2.  Clinical  correlation with patient history and other diagnostic information is  necessary to determine patient infection status.  Positive results do  not rule out bacterial infection or co-infection with other viruses. If result is PRESUMPTIVE POSTIVE SARS-CoV-2 nucleic acids MAY BE PRESENT.   A presumptive positive result was obtained on the submitted specimen  and confirmed on repeat testing.  While 2019 novel coronavirus  (SARS-CoV-2) nucleic acids may be present in the submitted sample  additional confirmatory testing may be necessary for epidemiological  and / or clinical management purposes  to differentiate between  SARS-CoV-2 and other Sarbecovirus currently known to infect humans.  If clinically indicated additional testing with an alternate test  methodology [phone]) is advised. The SARS-CoV-2 RNA is generally  detectable in upper and lower respiratory sp ecimens during the acute  phase of infection. The expected result is Negative. Fact Sheet for Patients:  StrictlyIdeas.no Fact Sheet for Healthcare Providers: BankingDealers.co.za This test is not yet approved or cleared by the [place] FDA and has been authorized for detection and/or diagnosis of SARS-CoV-2 by FDA under an Emergency Use Authorization (EUA).  This EUA will remain in effect (meaning this test can be used) for the duration of the COVID-19 declaration under Section 564(b)(1) of the Act, 21 U.S.C. section 360bbb-3(b)(1), unless the authorization is terminated or revoked sooner. Performed at [facility], [address] [place], [place] [address]   Blood Culture (routine x 2)     Status: None (Preliminary result)   Collection Time: 06/10/18  2:38 PM  Result Value Ref Range Status   Specimen Description BLOOD RIGHT ANTECUBITAL  Final   Special Requests   Final  BOTTLES DRAWN AEROBIC AND ANAEROBIC Blood Culture  adequate volume   Culture   Final    NO GROWTH 3 DAYS Performed at [facility], [address] [place], [place] [address]    Report Status PENDING  Incomplete  MRSA PCR Screening     Status: Abnormal   Collection Time: 06/11/18  8:01 PM  Result Value Ref Range Status   MRSA by PCR POSITIVE (A) NEGATIVE Final    Comment:        The GeneXpert MRSA Assay (FDA approved for NASAL specimens only), is one component of a comprehensive MRSA colonization surveillance program. It is not intended to diagnose MRSA infection nor to guide or monitor treatment for MRSA infections. RESULT CALLED TO, READ BACK BY AND VERIFIED WITH: JAKKI PAGE AT 2136 06/11/2018.  TFK Performed at [facility], [address] [place], [place] [address]       Management plans discussed with the patient, family and they are in agreement.  CODE STATUS:     Code Status Orders  (From admission, onward)         Start     Ordered   06/10/18 2047  Full code  Continuous     06/10/18 2046        Code Status History    Date Active Date Inactive Code Status Order ID Comments User Context   04/28/2018 1053 04/29/2018 2305 Full Code [id number]  Epifanio Lesches, MD ED   04/13/2018 2359 04/15/2018 2116 Full Code [id number]  Arta Silence, MD Inpatient   02/24/2018 2221 02/28/2018 1815 Full Code [id number]  Dustin Flock, MD Inpatient   12/25/2017 1653 12/26/2017 2152 Full Code [id number]  Salary, Avel Peace, MD Inpatient   11/29/2017 2119 12/05/2017 1841 Full Code [id number]  Salary, Avel Peace, MD Inpatient   11/22/2017 0408 11/26/2017 1804 Full Code [id number]  Arta Silence, MD Inpatient   11/10/2017 1840 11/13/2017 1653 Full Code [id number]  Sela Hua, MD Inpatient   10/24/2017 0247 10/29/2017 2301 Full Code [id number]  Lance Coon, MD Inpatient   10/04/2017 1005 10/10/2017 1813 Full Code [id number]  Marliss Coots, PA-C Inpatient   10/01/2017 0819 10/04/2017 0904 Full Code [id number]   Lolita Cram, RN Inpatient   09/19/2017 1306 09/22/2017 1622 Full Code [id number]  Gorden Harms, MD Inpatient   09/11/2017 2021 09/15/2017 1510 Full Code [id number]  Gorden Harms, MD Inpatient   08/27/2017 1543 08/29/2017 2159 Full Code [id number]  Hillary Bow, MD ED   06/28/2017 1414 06/30/2017 1748 Full Code [id number]  Gorden Harms, MD Inpatient   06/12/2017 0858 06/16/2017 1659 DNR [id number]  Demetrios Loll, MD Inpatient   05/26/2017 1732 05/30/2017 1446 DNR [id number]  Hillary Bow, MD ED   05/17/2017 1228 05/21/2017 1511 DNR [id number]  Asencion Gowda, NP Inpatient   04/21/2017 0117 04/22/2017 1750 DNR [id number]  Lance Coon, MD Inpatient   04/05/2017 1411 04/08/2017 2101 Full Code [id number]  Saundra Shelling, MD Inpatient   02/08/2016 1414 02/11/2016 1729 Full Code [id number]  Hessie Knows, MD Inpatient   11/27/2015 1645 11/29/2015 1628 Full Code [id number]  Idelle Crouch, MD Inpatient   02/10/2015 2122 02/12/2015 1809 Full Code [id number]  Lytle Butte, MD ED   09/16/2014 1220 09/19/2014 1736 Full Code [id number]  Aldean Jewett, MD Inpatient      TOTAL TIME TAKING CARE OF THIS PATIENT: 35 minutes.    Loletha Grayer M.D on 06/13/2018 at 9:50 AM  Between 7am to 6pm - Pager -  [phone]  After 6pm go to [email or web address] - Proofreader  [employer] Office  [phone]  CC: Primary care physician; Valerie Roys, DO

## 2018-06-13 NOTE — TOC Transition Note (Signed)
Transition of Care [facility]) - CM/SW Discharge Note   Patient Details  Name: Katherine [name] MRN: [id number] Date of Birth: 1940/02/27  Transition of Care [facility]) CM/SW Contact:  Su Hilt, RN Phone Number: 06/13/2018, 10:06 AM   Clinical Narrative:    Patient to DC back to [facility], Her Husband [name] has been notified She will go to Room 27 A and be transported via EMS bedside nurse to call, Report to be called to [phone]    Final next level of care: Skilled Nursing Facility Barriers to Discharge: Barriers Resolved   Patient Goals and CMS Choice        Discharge Placement              Patient chooses bed at: [facility] care Long term Room 27 A) Patient to be transferred to facility by: EMS Name of family member notified: Bennie Hind [phone] Patient and family notified of of transfer: 06/13/18  Discharge Plan and Services   Discharge Planning Services: CM Consult                                 Social Determinants of Health (SDOH) Interventions     Readmission Risk Interventions Readmission Risk Prevention Plan 04/29/2018 04/29/2018 09/22/2017  Transportation Screening Complete Complete Complete  Medication Review Press photographer) Complete Complete Complete  PCP or Specialist appointment within 3-5 days of discharge Not Complete - Complete  PCP/Specialist Appt Not Complete comments Patient to return to SNF, and they will make appointments  - -  [place] or [place] (No Data) - Complete  SW Recovery Care/Counseling Consult (No Data) - Not Complete  Palliative Care Screening Complete - Patient refused  Medication Reconcilation (Pharmacy) - - Complete  [place] Complete - Patient refused  Some recent data might be hidden

## 2018-06-14 ENCOUNTER — Telehealth: Payer: Self-pay | Admitting: Internal Medicine

## 2018-06-14 DIAGNOSIS — R5381 Other malaise: Secondary | ICD-10-CM | POA: Diagnosis not present

## 2018-06-14 DIAGNOSIS — N3000 Acute cystitis without hematuria: Secondary | ICD-10-CM | POA: Diagnosis not present

## 2018-06-14 DIAGNOSIS — J449 Chronic obstructive pulmonary disease, unspecified: Secondary | ICD-10-CM | POA: Diagnosis not present

## 2018-06-14 DIAGNOSIS — I509 Heart failure, unspecified: Secondary | ICD-10-CM | POA: Diagnosis not present

## 2018-06-14 LAB — URINE CULTURE: Culture: 60000 — AB

## 2018-06-14 NOTE — Progress Notes (Signed)
Patient ID: [name], female   DOB: [birth year], 78 y.o.   MRN: [id number]  Enterococcus resistant to the Augmentin that I prescribed.  We will have Josh the care manager send over prescription for Zyvox to the facility.  Patient will complete 5 days of that medication.  Patient will need to stop Prozac while taking this medication and can continue Prozac afterwards.  Can stop Augmentin at this time  Dr Loletha Grayer

## 2018-06-15 LAB — CULTURE, BLOOD (ROUTINE X 2)
Culture: NO GROWTH
Culture: NO GROWTH
Special Requests: ADEQUATE

## 2018-06-18 DIAGNOSIS — M109 Gout, unspecified: Secondary | ICD-10-CM | POA: Diagnosis not present

## 2018-06-18 DIAGNOSIS — J449 Chronic obstructive pulmonary disease, unspecified: Secondary | ICD-10-CM | POA: Diagnosis not present

## 2018-06-18 DIAGNOSIS — J9611 Chronic respiratory failure with hypoxia: Secondary | ICD-10-CM | POA: Diagnosis not present

## 2018-06-18 DIAGNOSIS — E1159 Type 2 diabetes mellitus with other circulatory complications: Secondary | ICD-10-CM | POA: Diagnosis not present

## 2018-06-21 ENCOUNTER — Non-Acute Institutional Stay: Payer: Medicare HMO | Admitting: Nurse Practitioner

## 2018-06-21 ENCOUNTER — Other Ambulatory Visit: Payer: Self-pay

## 2018-06-21 ENCOUNTER — Encounter: Payer: Self-pay | Admitting: Nurse Practitioner

## 2018-06-21 VITALS — BP 116/77 | HR 80 | Temp 97.8°F | Resp 18 | Wt 242.5 lb

## 2018-06-21 DIAGNOSIS — Z515 Encounter for palliative care: Secondary | ICD-10-CM | POA: Diagnosis not present

## 2018-06-21 DIAGNOSIS — R0602 Shortness of breath: Secondary | ICD-10-CM

## 2018-06-21 DIAGNOSIS — R601 Generalized edema: Secondary | ICD-10-CM

## 2018-06-21 NOTE — Patient Outreach (Signed)
[facility]) Care Management  06/21/2018  Jamariah Tony [birth year] [id number]     Transition of Care Referral  Referral Date: 06/21/2018 Referral Source: [facility] ([facility]) Discharge Report Date of Admission: unknown Diagnosis: " acute cystitis with hematuria" Date of Discharge: 06/20/2018 Facility: [place]    Outreach attempt # 1 to patient. No answer at present and unable to leave message as voicemail box full.     Plan: RN CM will make outreach attempt to patient within 3-4 business days. RN CM will send unsuccessful outreach letter to patient.   Enzo Montgomery, RN,BSN,CCM [place] Management Telephonic Care Management Coordinator Direct Phone: [phone] Toll Free: [phone] Fax: [phone]

## 2018-06-21 NOTE — Progress Notes (Signed)
[employer] Palliative Care Consult Note Telephone: [phone]  Fax: [phone]  PATIENT NAME: Katherine [name] DOB: [birth year] MRN: [id number]  PRIMARY CARE PROVIDER:   Valerie Roys DO  REFERRING PROVIDER:  Dr Hodges/[facility] RESPONSIBLE PARTY:   Self/[name] spouse [phone]  RECOMMENDATIONS and PLAN:  1. Palliative care encounter Z51.5; Palliative medicine team will continue to support patient, patient's family, and medical team. Visit consisted of counseling and education dealing with the complex and emotionally intense issues of symptom management and palliative care in the setting of serious and potentially life-threatening illness  2. Edema R60.9 secondary to CHF, monitor weights, elevate  3. Shortness of breath R06.02 secondary to CHF, continue diuresing, inhalation therapy; Continuous O2,  Continue daily weights    ASSESSMENT:    I visited and observed Katherine [name]. We talked about purpose for palliative care visit. We talked about how she was feeling today. She is heard that she is feeling a little better. We talked about past medical history in the setting of chronic disease. We talked about recent hospitalization. We talked about some things of pain what she denies. We talked about shortness of breath, endorses she is comfortable. We talked about oxygen dependency. We talked about her limited mobility. We talked about her work with therapy she shared that she is doing that exercises. We talked about her appetite. We talked about realistic expectations in the progression of disease. Her wishes are to return home if able. We talked about medical goals of care and wishes are to remain aggressive interventions and a full code. Talked about role of palliative care and plan of care. Talked about Life review. Therapeutic listening and emotional support provided. Questions answered the satisfaction. I have attempted to contact  her husband but unable to leave a message. I updated nursing staff and any changes to current goals or plan of care.  2 /03/2018 weight 258.0 lbs 3 /27/2020 weight 259.5 lbs  06/19/2018 weight 242.5 lbs  I spent 60 minutes providing this consultation,  from 10:15am to 11:15am. More than 50% of the time in this consultation was spent coordinating communication.   HISTORY OF PRESENT ILLNESS:  Katherine [name] is a 78 y.o. 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  Hospitalization 4 / 27 / 2020 to 4 / 30 / 2020 for acute respiratory failure secondary to pneumonia could not be ruled out on initial x-ray though subsequential did not show pneumonia. She is ADL dependent. COPD exacerbation receiving steroids and Nebulizer treatment therapy. Acute cystitis with inner caucus growing at a culture initially play some take of mice and then switch to Augmentin. Acute metabolic encephalopathy improved upon discharge. Acute gout left hand side, given solumedrol improved and culture same was added with uric acid slightly elevated and low-dose allopurinol started. Diabetes on Tradjenta. Chronic kidney disease stage 3. She is on Prozac for depression. She does take Keppra for seizures with no seizure activity. She did have some nausea with vomiting the resolved. She returned to [place] for short-term rehab where she currently resides at  [facility]. She is over hoyer lift and does have trouble sitting up on the side of the bed. Therapy will work with her with bed exercises. She is total ADL dependence and requires assistance for toileting. She does see herself and staff reports appetite is good. She is oriented and able to verbalize her needs. At present she is lying in bed. She is obese, chronically ill, 02 dependent though comfortable. No visitors present. Palliative Care was asked to help to continue to address goals of care.   CODE STATUS: Full code  PPS: 30% HOSPICE ELIGIBILITY/DIAGNOSIS: TBD  PAST MEDICAL HISTORY:  Past Medical History:  Diagnosis Date   Angioedema    Anxiety    Anxiety and depression    CHF (congestive heart failure) (HCC)    CHF (congestive heart failure) (HCC)    Chronic constipation    Chronic kidney disease    stage 3   COPD (chronic obstructive pulmonary disease) (HCC)    Diabetes mellitus without complication (HCC)    Gallstones    GERD (gastroesophageal reflux disease)    Hypertension    Left ventricular hypertrophy    Osteoarthritis    Prurigo nodularis    Seizures (HCC)    Stroke (HCC)    Tobacco abuse    Vitamin D deficiency disease     SOCIAL HX:  Social History   Tobacco Use   Smoking status: Former Smoker    Packs/day: 0.50    Years: 60.00    Pack years: 30.00    Types: Cigarettes   Smokeless tobacco: Never Used   Tobacco comment: quite 65mo ago-03/26/18  Substance Use Topics   Alcohol use: No    Alcohol/week: 0.0 standard drinks    Comment: rare    ALLERGIES:  Allergies  Allergen Reactions   Bee Venom Swelling   Enalapril Maleate Swelling     PERTINENT MEDICATIONS:  Outpatient Encounter Medications as of 06/21/2018  Medication Sig   acetaminophen (TYLENOL) 650 MG CR tablet Take 650 mg by mouth 3 (three) times daily.    albuterol (ACCUNEB) 0.63 MG/3ML nebulizer solution Take 3 mLs by nebulization every 8 (eight)  hours as needed for wheezing.    albuterol (PROVENTIL HFA;VENTOLIN HFA) 108 (90 Base) MCG/ACT inhaler Inhale 2 puffs into the lungs every 4 (four) hours as needed for wheezing or shortness of breath.   amoxicillin-clavulanate (AUGMENTIN) 875-125 MG tablet Take 1 tablet by mouth every 12 (twelve) hours.   atorvastatin (LIPITOR) 80 MG tablet Take 80 mg by mouth every evening.    cetirizine (ZYRTEC) 10 MG tablet Take 5 mg by mouth at bedtime.   cholecalciferol (VITAMIN D) 1000 units tablet Take 1,000 Units by mouth daily.   clopidogrel (PLAVIX) 75 MG tablet Take 75 mg by mouth daily.   colchicine 0.6 MG tablet Take 1 tablet (0.6 mg total) by mouth daily.   diclofenac sodium (VOLTAREN) 1 % GEL Apply 2 g topically every 12 (twelve) hours as needed (for left knee pain).    docusate sodium (COLACE) 100 MG capsule Take 100 mg by mouth 2 (two) times daily.   FLUoxetine (PROZAC) 40 MG capsule Take 40 mg by mouth daily.    Fluticasone-Umeclidin-Vilant 100-62.5-25 MCG/INH AEPB Inhale 1 puff into the lungs daily.   gabapentin (NEURONTIN) 300 MG capsule Take 300 mg by mouth at bedtime.   ipratropium (ATROVENT) 0.03 % nasal spray Place 2 sprays into both nostrils 2 (two) times daily.    ipratropium-albuterol (DUONEB) 0.5-2.5 (3) MG/3ML SOLN  Take 3 mLs by nebulization 2 (two) times daily.    levETIRAcetam (KEPPRA XR) 500 MG 24 hr tablet Take 1 tablet (500 mg total) by mouth daily.   linagliptin (TRADJENTA) 5 MG TABS tablet Take 5 mg by mouth daily.   LORazepam (ATIVAN) 2 MG/ML injection Inject 0.5 mLs (1 mg total) into the muscle daily as needed for seizure.   meclizine (ANTIVERT) 25 MG tablet Take 25 mg by mouth every 12 (twelve) hours as needed for dizziness.    Multiple Vitamin (MULTIVITAMIN WITH MINERALS) TABS tablet Take 1 tablet by mouth daily.   mupirocin ointment (BACTROBAN) 2 % Place 1 application into the nose 2 (two) times daily.   nystatin (MYCOSTATIN) 100000 UNIT/ML suspension  Take 5 mLs (500,000 Units total) by mouth 4 (four) times daily.   pantoprazole (PROTONIX) 40 MG tablet Take 1 tablet (40 mg total) by mouth daily.   predniSONE (DELTASONE) 5 MG tablet 4 tabs po day1; 3 tabs po day 2; 2 tabs po day3; 1 tab po day4; 1/2 tab po day5,6   vitamin B-12 1000 MCG tablet Take 1 tablet (1,000 mcg total) by mouth daily.   No facility-administered encounter medications on file as of 06/21/2018.     PHYSICAL EXAM:   General: obese, debilitated/bedbound chronically ill female Cardiovascular: regular rate and rhythm Pulmonary: clear ant fields Abdomen:obese,  soft, nontender, + bowel sounds GU: no suprapubic tenderness Extremities: +BLE edema, no joint deformities Skin: no rashes Neurological: Weakness but otherwise nonfocal/non-ambulatory  Cadee Agro Ihor Gully, NP

## 2018-06-24 ENCOUNTER — Other Ambulatory Visit: Payer: Self-pay

## 2018-06-24 NOTE — Patient Outreach (Signed)
[facility]) Care Management  06/24/2018  Katherine [name] 21-Nov-1940 [id number]   Transition of Care Referral  Referral Date: 06/21/2018 Referral Source: [facility] Discharge Report Date of Admission: unknown Diagnosis: " acute cystitis with hematuria" Date of Discharge: 06/20/2018 Facility: [facility]    Outreach attempt #2 to patient. Spoke with patient's spouse. He reported that patient was still at "[facility]" and did not know if and when she would be returning home.     Plan: RN CM will close case as patient at inpatient facility.  Enzo Montgomery, RN,BSN,CCM [facility] Telephonic Care Management Coordinator Direct Phone: [phone] Toll Free: [phone] Fax: [phone]

## 2018-07-01 IMAGING — CT CT KNEE*R* W/O CM
1 of 3 series · 7 of 14 positions shown, 9 images · non-contrast
Comparison: None.

CLINICAL DATA: Right knee pain and swelling for several years

EXAM:
CT OF THE RIGHT KNEE WITHOUT CONTRAST
TECHNIQUE: Multidetector CT imaging of the RIGHT knee was performed according
to the standard protocol. Multiplanar CT image reconstructions were
also generated.

[Series 6: axial st · axial · 0.39mm/px · z∈[+593,+817]mm · 7 of 300 slices shown, 9 images]
[im 38/300  soft-tissue]
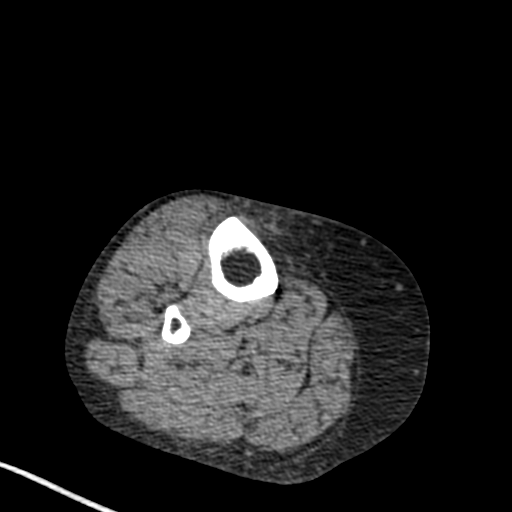
[im 38/300  bone]
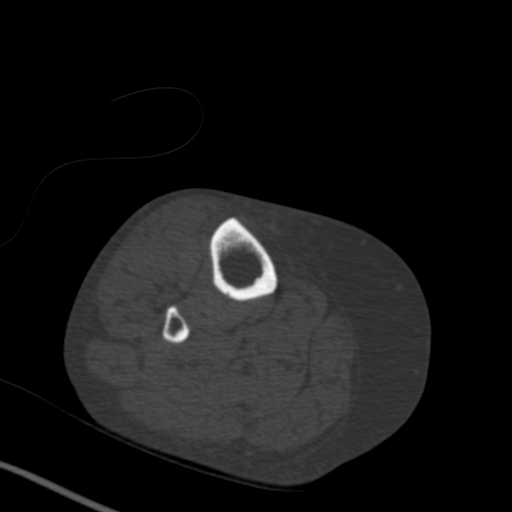
[im 75/300  bone]
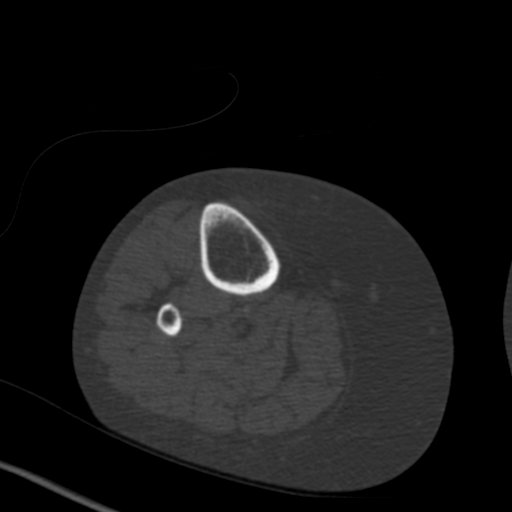
[im 113/300  bone]
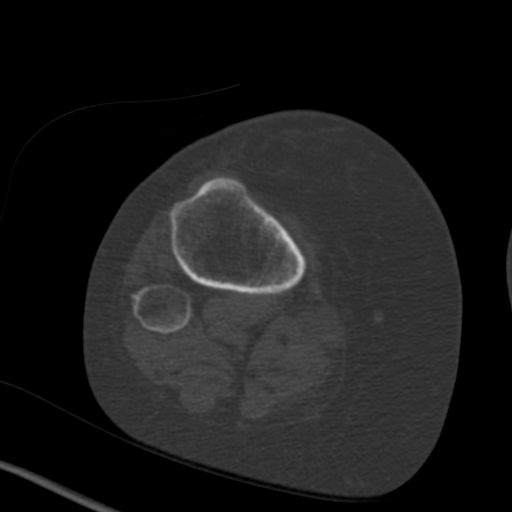
[im 150/300  bone]
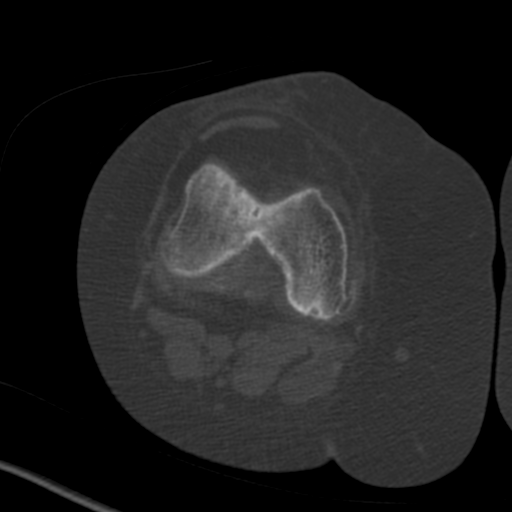
[im 187/300  soft-tissue]
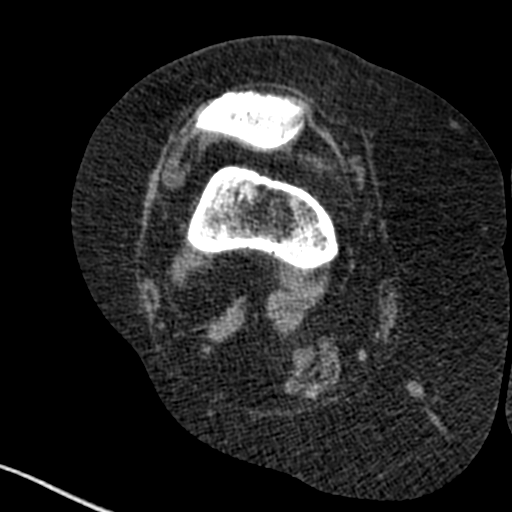
[im 187/300  bone]
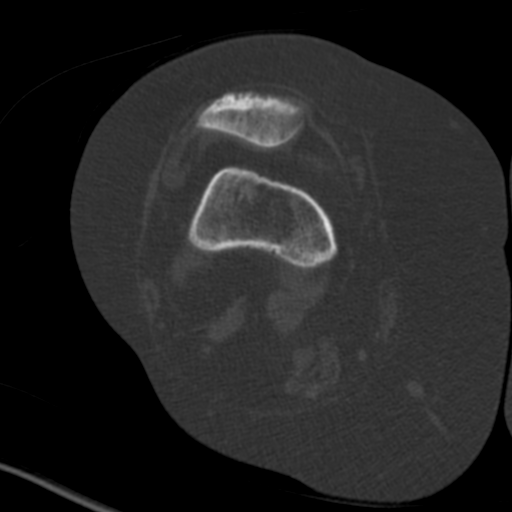
[im 225/300  bone]
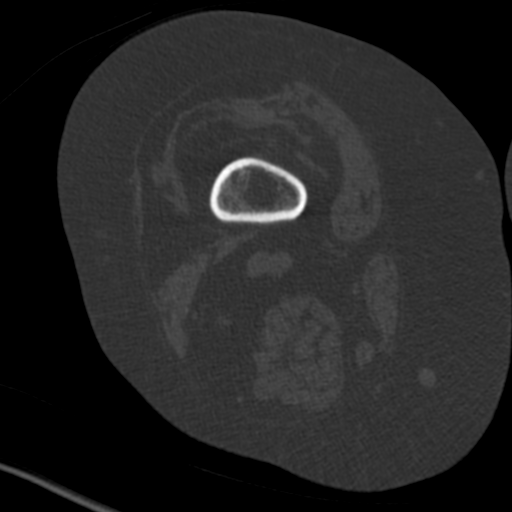
[im 262/300  bone]
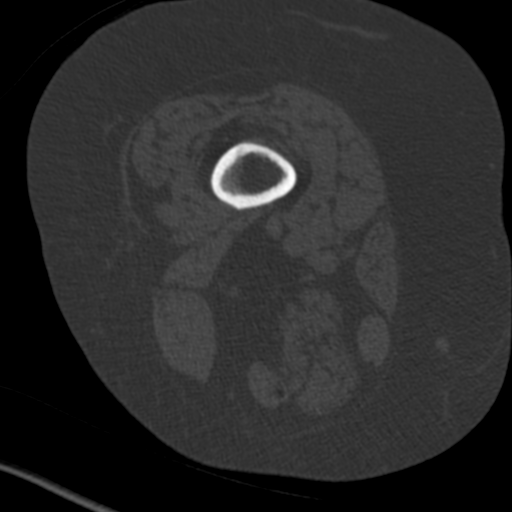

[7 of 14 positions shown; findings below may reference images not displayed]

FINDINGS: Bones/Joint/Cartilage

The hip demonstrates no fracture or dislocation. There is no lytic
or blastic lesion. The visualized right SI joint demonstrates
moderate osteoarthritis. Mild osteoarthritis of the right hip.

The knee demonstrates no fracture or dislocation. There is no lytic
or blastic lesion. There is a small Baker cyst. There is no
significant joint effusion. There is severe medial femorotibial
compartment osteoarthritis with joint space narrowing, subchondral
sclerosis marginal osteophytosis. There is mild lateral femorotibial
compartment joint space narrowing. There is moderate lateral
patellofemoral compartment joint space narrowing. There is
chondrocalcinosis of the medial and lateral femorotibial
compartments as can be seen with CPPD.

The ankle demonstrates no fracture or dislocation. There is no lytic
or blastic lesion. There is moderate osteoarthritis of the 1st MTP
joint.

Ligaments

Suboptimally assessed by CT.

Muscles and Tendons

Muscles are normal. No significant muscle atrophy. Quadriceps tendon
and patellar tendon are grossly intact.

Soft tissues

No fluid collection or hematoma.
IMPRESSION: 1. Tricompartmental osteoarthritis of the right knee most severe in
the medial femorotibial compartment.

## 2018-07-05 DIAGNOSIS — R569 Unspecified convulsions: Secondary | ICD-10-CM | POA: Diagnosis not present

## 2018-07-06 DIAGNOSIS — R569 Unspecified convulsions: Secondary | ICD-10-CM | POA: Diagnosis not present

## 2018-07-08 DIAGNOSIS — R509 Fever, unspecified: Secondary | ICD-10-CM | POA: Diagnosis not present

## 2018-07-08 DIAGNOSIS — R05 Cough: Secondary | ICD-10-CM | POA: Diagnosis not present

## 2018-07-09 DIAGNOSIS — R319 Hematuria, unspecified: Secondary | ICD-10-CM | POA: Diagnosis not present

## 2018-07-09 DIAGNOSIS — Z20828 Contact with and (suspected) exposure to other viral communicable diseases: Secondary | ICD-10-CM | POA: Diagnosis not present

## 2018-07-09 DIAGNOSIS — R509 Fever, unspecified: Secondary | ICD-10-CM | POA: Diagnosis not present

## 2018-07-09 DIAGNOSIS — D649 Anemia, unspecified: Secondary | ICD-10-CM | POA: Diagnosis not present

## 2018-07-09 DIAGNOSIS — N390 Urinary tract infection, site not specified: Secondary | ICD-10-CM | POA: Diagnosis not present

## 2018-07-13 DIAGNOSIS — J449 Chronic obstructive pulmonary disease, unspecified: Secondary | ICD-10-CM | POA: Diagnosis not present

## 2018-07-15 ENCOUNTER — Other Ambulatory Visit: Payer: Self-pay

## 2018-07-15 ENCOUNTER — Observation Stay: Payer: Medicare HMO

## 2018-07-15 ENCOUNTER — Emergency Department: Payer: Medicare HMO

## 2018-07-15 ENCOUNTER — Encounter: Payer: Self-pay | Admitting: Emergency Medicine

## 2018-07-15 ENCOUNTER — Observation Stay
Admission: EM | Admit: 2018-07-15 | Discharge: 2018-07-17 | Disposition: A | Discharge status: Skilled Nursing Facility | Payer: Medicare HMO | Attending: Internal Medicine | Admitting: Internal Medicine

## 2018-07-15 DIAGNOSIS — Z7951 Long term (current) use of inhaled steroids: Secondary | ICD-10-CM | POA: Insufficient documentation

## 2018-07-15 DIAGNOSIS — I5032 Chronic diastolic (congestive) heart failure: Secondary | ICD-10-CM | POA: Diagnosis not present

## 2018-07-15 DIAGNOSIS — I130 Hypertensive heart and chronic kidney disease with heart failure and stage 1 through stage 4 chronic kidney disease, or unspecified chronic kidney disease: Secondary | ICD-10-CM | POA: Diagnosis not present

## 2018-07-15 DIAGNOSIS — N183 Chronic kidney disease, stage 3 (moderate): Secondary | ICD-10-CM | POA: Diagnosis not present

## 2018-07-15 DIAGNOSIS — K219 Gastro-esophageal reflux disease without esophagitis: Secondary | ICD-10-CM | POA: Insufficient documentation

## 2018-07-15 DIAGNOSIS — J90 Pleural effusion, not elsewhere classified: Secondary | ICD-10-CM | POA: Diagnosis not present

## 2018-07-15 DIAGNOSIS — F419 Anxiety disorder, unspecified: Secondary | ICD-10-CM | POA: Insufficient documentation

## 2018-07-15 DIAGNOSIS — Z66 Do not resuscitate: Secondary | ICD-10-CM | POA: Insufficient documentation

## 2018-07-15 DIAGNOSIS — M79604 Pain in right leg: Secondary | ICD-10-CM | POA: Diagnosis not present

## 2018-07-15 DIAGNOSIS — A498 Other bacterial infections of unspecified site: Secondary | ICD-10-CM | POA: Diagnosis not present

## 2018-07-15 DIAGNOSIS — R0789 Other chest pain: Secondary | ICD-10-CM | POA: Diagnosis not present

## 2018-07-15 DIAGNOSIS — J441 Chronic obstructive pulmonary disease with (acute) exacerbation: Secondary | ICD-10-CM | POA: Diagnosis not present

## 2018-07-15 DIAGNOSIS — E559 Vitamin D deficiency, unspecified: Secondary | ICD-10-CM | POA: Insufficient documentation

## 2018-07-15 DIAGNOSIS — E1122 Type 2 diabetes mellitus with diabetic chronic kidney disease: Secondary | ICD-10-CM | POA: Diagnosis not present

## 2018-07-15 DIAGNOSIS — Z993 Dependence on wheelchair: Secondary | ICD-10-CM | POA: Insufficient documentation

## 2018-07-15 DIAGNOSIS — M1990 Unspecified osteoarthritis, unspecified site: Secondary | ICD-10-CM | POA: Diagnosis not present

## 2018-07-15 DIAGNOSIS — F329 Major depressive disorder, single episode, unspecified: Secondary | ICD-10-CM | POA: Insufficient documentation

## 2018-07-15 DIAGNOSIS — Z20828 Contact with and (suspected) exposure to other viral communicable diseases: Secondary | ICD-10-CM | POA: Diagnosis not present

## 2018-07-15 DIAGNOSIS — R000 Tachycardia, unspecified: Secondary | ICD-10-CM | POA: Diagnosis not present

## 2018-07-15 DIAGNOSIS — Z8673 Personal history of transient ischemic attack (TIA), and cerebral infarction without residual deficits: Secondary | ICD-10-CM | POA: Diagnosis not present

## 2018-07-15 DIAGNOSIS — M79605 Pain in left leg: Secondary | ICD-10-CM | POA: Diagnosis not present

## 2018-07-15 DIAGNOSIS — G40909 Epilepsy, unspecified, not intractable, without status epilepticus: Secondary | ICD-10-CM | POA: Insufficient documentation

## 2018-07-15 DIAGNOSIS — A0472 Enterocolitis due to Clostridium difficile, not specified as recurrent: Secondary | ICD-10-CM | POA: Diagnosis not present

## 2018-07-15 DIAGNOSIS — R079 Chest pain, unspecified: Secondary | ICD-10-CM

## 2018-07-15 DIAGNOSIS — R197 Diarrhea, unspecified: Secondary | ICD-10-CM | POA: Diagnosis not present

## 2018-07-15 DIAGNOSIS — Z1159 Encounter for screening for other viral diseases: Secondary | ICD-10-CM | POA: Diagnosis not present

## 2018-07-15 DIAGNOSIS — Z79899 Other long term (current) drug therapy: Secondary | ICD-10-CM | POA: Insufficient documentation

## 2018-07-15 DIAGNOSIS — R0602 Shortness of breath: Secondary | ICD-10-CM | POA: Diagnosis not present

## 2018-07-15 DIAGNOSIS — J9811 Atelectasis: Secondary | ICD-10-CM | POA: Diagnosis not present

## 2018-07-15 DIAGNOSIS — M7989 Other specified soft tissue disorders: Secondary | ICD-10-CM | POA: Diagnosis not present

## 2018-07-15 HISTORY — DX: Chest pain, unspecified: R07.9

## 2018-07-15 LAB — CBC WITH DIFFERENTIAL/PLATELET
Abs Immature Granulocytes: 0.08 10*3/uL — ABNORMAL HIGH (ref 0.00–0.07)
Basophils Absolute: 0 10*3/uL (ref 0.0–0.1)
Basophils Relative: 1 %
Eosinophils Absolute: 0.1 10*3/uL (ref 0.0–0.5)
Eosinophils Relative: 2 %
HCT: 32.4 % — ABNORMAL LOW (ref 36.0–46.0)
Hemoglobin: 10.1 g/dL — ABNORMAL LOW (ref 12.0–15.0)
Immature Granulocytes: 1 %
Lymphocytes Relative: 16 %
Lymphs Abs: 1 10*3/uL (ref 0.7–4.0)
MCH: 26.7 pg (ref 26.0–34.0)
MCHC: 31.2 g/dL (ref 30.0–36.0)
MCV: 85.7 fL (ref 80.0–100.0)
Monocytes Absolute: 0.8 10*3/uL (ref 0.1–1.0)
Monocytes Relative: 12 %
Neutro Abs: 4.5 10*3/uL (ref 1.7–7.7)
Neutrophils Relative %: 68 %
Platelets: 347 10*3/uL (ref 150–400)
RBC: 3.78 MIL/uL — ABNORMAL LOW (ref 3.87–5.11)
RDW: 19.5 % — ABNORMAL HIGH (ref 11.5–15.5)
WBC: 6.5 10*3/uL (ref 4.0–10.5)
nRBC: 0 % (ref 0.0–0.2)

## 2018-07-15 LAB — COMPREHENSIVE METABOLIC PANEL
ALT: 19 U/L (ref 0–44)
AST: 31 U/L (ref 15–41)
Albumin: 2.5 g/dL — ABNORMAL LOW (ref 3.5–5.0)
Alkaline Phosphatase: 145 U/L — ABNORMAL HIGH (ref 38–126)
Anion gap: 8 (ref 5–15)
BUN: 15 mg/dL (ref 8–23)
CO2: 25 mmol/L (ref 22–32)
Calcium: 10.1 mg/dL (ref 8.9–10.3)
Chloride: 105 mmol/L (ref 98–111)
Creatinine, Ser: 1.67 mg/dL — ABNORMAL HIGH (ref 0.44–1.00)
GFR calc Af Amer: 34 mL/min — ABNORMAL LOW (ref >60)
GFR calc non Af Amer: 29 mL/min — ABNORMAL LOW (ref >60)
Glucose, Bld: 95 mg/dL (ref 70–99)
Potassium: 4.3 mmol/L (ref 3.5–5.1)
Sodium: 138 mmol/L (ref 135–145)
Total Bilirubin: 0.7 mg/dL (ref 0.3–1.2)
Total Protein: 7.2 g/dL (ref 6.5–8.1)

## 2018-07-15 LAB — BLOOD GAS, VENOUS
Acid-Base Excess: 4 mmol/L — ABNORMAL HIGH (ref 0.0–2.0)
Bicarbonate: 29.7 mmol/L — ABNORMAL HIGH (ref 20.0–28.0)
O2 Saturation: 88.2 %
Patient temperature: 37
pCO2, Ven: 48 mmHg (ref 44.0–60.0)
pH, Ven: 7.4 (ref 7.250–7.430)
pO2, Ven: 55 mmHg — ABNORMAL HIGH (ref 32.0–45.0)

## 2018-07-15 LAB — SARS CORONAVIRUS 2 BY RT PCR (HOSPITAL ORDER, PERFORMED IN ~~LOC~~ HOSPITAL LAB): SARS Coronavirus 2: NEGATIVE

## 2018-07-15 LAB — BRAIN NATRIURETIC PEPTIDE: B Natriuretic Peptide: 92 pg/mL (ref 0.0–100.0)

## 2018-07-15 LAB — LACTIC ACID, PLASMA
Lactic Acid, Venous: 1.1 mmol/L (ref 0.5–1.9)
Lactic Acid, Venous: 1.1 mmol/L (ref 0.5–1.9)

## 2018-07-15 LAB — FIBRIN DERIVATIVES D-DIMER (ARMC ONLY): Fibrin derivatives D-dimer (ARMC): 5408.09 ng/mL (FEU) — ABNORMAL HIGH (ref 0.00–499.00)

## 2018-07-15 LAB — TROPONIN I
Troponin I: <0.03 ng/mL (ref <0.03)
Troponin I: <0.03 ng/mL (ref <0.03)

## 2018-07-15 LAB — GLUCOSE, CAPILLARY
Glucose-Capillary: 104 mg/dL — ABNORMAL HIGH (ref 70–99)
Glucose-Capillary: 266 mg/dL — ABNORMAL HIGH (ref 70–99)

## 2018-07-15 MED ORDER — MECLIZINE HCL 25 MG PO TABS
25.0000 mg | ORAL_TABLET | Freq: Two times a day (BID) | ORAL | Status: DC | PRN
  Filled 2018-07-15: qty 1

## 2018-07-15 MED ORDER — VITAMIN D3 25 MCG (1000 UNIT) PO TABS
1000.0000 [IU] | ORAL_TABLET | Freq: Every day | ORAL | Status: DC
  Administered 2018-07-16 – 2018-07-17 (×2): 1000 [IU] via ORAL
  Filled 2018-07-15 (×3): qty 1

## 2018-07-15 MED ORDER — ONDANSETRON HCL 4 MG/2ML IJ SOLN: 4.0000 mg | Freq: Four times a day (QID) | INTRAMUSCULAR | Status: DC | PRN

## 2018-07-15 MED ORDER — ACETAMINOPHEN 325 MG PO TABS
650.0000 mg | ORAL_TABLET | Freq: Four times a day (QID) | ORAL | Status: DC | PRN
  Administered 2018-07-15 – 2018-07-17 (×2): 650 mg via ORAL
  Filled 2018-07-15 (×3): qty 2

## 2018-07-15 MED ORDER — INSULIN ASPART 100 UNIT/ML ~~LOC~~ SOLN
0.0000 [IU] | Freq: Every day | SUBCUTANEOUS | Status: DC
  Administered 2018-07-15: 3 [IU] via SUBCUTANEOUS
  Filled 2018-07-15: qty 1

## 2018-07-15 MED ORDER — GABAPENTIN 300 MG PO CAPS
300.0000 mg | ORAL_CAPSULE | Freq: Every day | ORAL | Status: DC
  Administered 2018-07-15 – 2018-07-16 (×2): 300 mg via ORAL
  Filled 2018-07-15 (×2): qty 1

## 2018-07-15 MED ORDER — INSULIN ASPART 100 UNIT/ML ~~LOC~~ SOLN
0.0000 [IU] | Freq: Three times a day (TID) | SUBCUTANEOUS | Status: DC
  Administered 2018-07-16: 3 [IU] via SUBCUTANEOUS
  Administered 2018-07-16: 1 [IU] via SUBCUTANEOUS
  Administered 2018-07-16 – 2018-07-17 (×2): 2 [IU] via SUBCUTANEOUS
  Filled 2018-07-15 (×4): qty 1

## 2018-07-15 MED ORDER — HYDRALAZINE HCL 25 MG PO TABS
25.0000 mg | ORAL_TABLET | Freq: Three times a day (TID) | ORAL | Status: DC
  Administered 2018-07-15 – 2018-07-17 (×5): 25 mg via ORAL
  Filled 2018-07-15 (×6): qty 1

## 2018-07-15 MED ORDER — ALBUTEROL SULFATE HFA 108 (90 BASE) MCG/ACT IN AERS
4.0000 | INHALATION_SPRAY | Freq: Once | RESPIRATORY_TRACT | Status: AC
  Administered 2018-07-15: 4 via RESPIRATORY_TRACT
  Filled 2018-07-15: qty 6.7

## 2018-07-15 MED ORDER — ENOXAPARIN SODIUM 40 MG/0.4ML ~~LOC~~ SOLN
40.0000 mg | Freq: Two times a day (BID) | SUBCUTANEOUS | Status: DC
  Administered 2018-07-15 – 2018-07-17 (×4): 40 mg via SUBCUTANEOUS
  Filled 2018-07-15 (×4): qty 0.4

## 2018-07-15 MED ORDER — NYSTATIN 100000 UNIT/ML MT SUSP
5.0000 mL | Freq: Four times a day (QID) | OROMUCOSAL | Status: DC
  Administered 2018-07-15 – 2018-07-16 (×2): 500000 [IU] via ORAL
  Filled 2018-07-15 (×3): qty 5

## 2018-07-15 MED ORDER — ACETAMINOPHEN 650 MG RE SUPP: 650.0000 mg | Freq: Four times a day (QID) | RECTAL | Status: DC | PRN

## 2018-07-15 MED ORDER — TECHNETIUM TC 99M DIETHYLENETRIAME-PENTAACETIC ACID
30.0000 | Freq: Once | INTRAVENOUS | Status: AC | PRN
  Administered 2018-07-15: 31.08 via RESPIRATORY_TRACT

## 2018-07-15 MED ORDER — DICLOFENAC SODIUM 1 % TD GEL
2.0000 g | Freq: Two times a day (BID) | TRANSDERMAL | Status: DC | PRN
  Filled 2018-07-15: qty 100

## 2018-07-15 MED ORDER — IPRATROPIUM-ALBUTEROL 0.5-2.5 (3) MG/3ML IN SOLN: 3.0000 mL | Freq: Once | RESPIRATORY_TRACT | Status: DC

## 2018-07-15 MED ORDER — VITAMIN B-12 1000 MCG PO TABS
1000.0000 ug | ORAL_TABLET | Freq: Every day | ORAL | Status: DC
  Administered 2018-07-16 – 2018-07-17 (×2): 1000 ug via ORAL
  Filled 2018-07-15 (×2): qty 1

## 2018-07-15 MED ORDER — MUPIROCIN 2 % EX OINT
1.0000 "application " | TOPICAL_OINTMENT | Freq: Two times a day (BID) | CUTANEOUS | Status: DC
  Administered 2018-07-16 – 2018-07-17 (×3): 1 via NASAL
  Filled 2018-07-15: qty 22

## 2018-07-15 MED ORDER — IPRATROPIUM-ALBUTEROL 0.5-2.5 (3) MG/3ML IN SOLN
3.0000 mL | RESPIRATORY_TRACT | Status: DC
  Administered 2018-07-15 – 2018-07-16 (×5): 3 mL via RESPIRATORY_TRACT
  Filled 2018-07-15 (×5): qty 3

## 2018-07-15 MED ORDER — TECHNETIUM TO 99M ALBUMIN AGGREGATED
4.0000 | Freq: Once | INTRAVENOUS | Status: AC | PRN
  Administered 2018-07-15: 4.12 via INTRAVENOUS

## 2018-07-15 MED ORDER — ADULT MULTIVITAMIN W/MINERALS CH
1.0000 | ORAL_TABLET | Freq: Every day | ORAL | Status: DC
  Administered 2018-07-16 – 2018-07-17 (×2): 1 via ORAL
  Filled 2018-07-15 (×2): qty 1

## 2018-07-15 MED ORDER — LINAGLIPTIN 5 MG PO TABS
5.0000 mg | ORAL_TABLET | Freq: Every day | ORAL | Status: DC
  Administered 2018-07-16 – 2018-07-17 (×2): 5 mg via ORAL
  Filled 2018-07-15 (×2): qty 1

## 2018-07-15 MED ORDER — LORATADINE 10 MG PO TABS
10.0000 mg | ORAL_TABLET | Freq: Every day | ORAL | Status: DC
  Administered 2018-07-16 – 2018-07-17 (×2): 10 mg via ORAL
  Filled 2018-07-15 (×2): qty 1

## 2018-07-15 MED ORDER — METHYLPREDNISOLONE SODIUM SUCC 125 MG IJ SOLR
125.0000 mg | Freq: Once | INTRAMUSCULAR | Status: AC
  Administered 2018-07-15: 125 mg via INTRAVENOUS
  Filled 2018-07-15: qty 2

## 2018-07-15 MED ORDER — IPRATROPIUM BROMIDE HFA 17 MCG/ACT IN AERS
2.0000 | INHALATION_SPRAY | Freq: Once | RESPIRATORY_TRACT | Status: AC
  Administered 2018-07-15: 2 via RESPIRATORY_TRACT
  Filled 2018-07-15: qty 12.9

## 2018-07-15 MED ORDER — DOCUSATE SODIUM 100 MG PO CAPS: 100.0000 mg | ORAL_CAPSULE | Freq: Two times a day (BID) | ORAL | Status: DC

## 2018-07-15 MED ORDER — LEVOFLOXACIN 750 MG PO TABS
750.0000 mg | ORAL_TABLET | ORAL | Status: DC
  Administered 2018-07-16: 750 mg via ORAL
  Filled 2018-07-15 (×2): qty 1

## 2018-07-15 MED ORDER — METHYLPREDNISOLONE SODIUM SUCC 125 MG IJ SOLR
60.0000 mg | Freq: Three times a day (TID) | INTRAMUSCULAR | Status: DC
  Administered 2018-07-15 – 2018-07-16 (×2): 60 mg via INTRAVENOUS
  Filled 2018-07-15 (×2): qty 2

## 2018-07-15 MED ORDER — PANTOPRAZOLE SODIUM 40 MG PO TBEC
40.0000 mg | DELAYED_RELEASE_TABLET | Freq: Every day | ORAL | Status: DC
  Administered 2018-07-16 – 2018-07-17 (×2): 40 mg via ORAL
  Filled 2018-07-15 (×2): qty 1

## 2018-07-15 MED ORDER — FLUOXETINE HCL 20 MG PO CAPS
40.0000 mg | ORAL_CAPSULE | Freq: Every day | ORAL | Status: DC
  Administered 2018-07-16 – 2018-07-17 (×2): 40 mg via ORAL
  Filled 2018-07-15 (×3): qty 2

## 2018-07-15 MED ORDER — LEVETIRACETAM ER 500 MG PO TB24
500.0000 mg | ORAL_TABLET | Freq: Every day | ORAL | Status: DC
  Administered 2018-07-16 – 2018-07-17 (×2): 500 mg via ORAL
  Filled 2018-07-15 (×3): qty 1

## 2018-07-15 MED ORDER — ATORVASTATIN CALCIUM 20 MG PO TABS
80.0000 mg | ORAL_TABLET | Freq: Every evening | ORAL | Status: DC
  Administered 2018-07-16 – 2018-07-17 (×2): 80 mg via ORAL
  Filled 2018-07-15 (×2): qty 4

## 2018-07-15 MED ORDER — CLOPIDOGREL BISULFATE 75 MG PO TABS
75.0000 mg | ORAL_TABLET | Freq: Every day | ORAL | Status: DC
  Administered 2018-07-16 – 2018-07-17 (×2): 75 mg via ORAL
  Filled 2018-07-15 (×2): qty 1

## 2018-07-15 MED ORDER — FLUTICASONE FUROATE-VILANTEROL 100-25 MCG/INH IN AEPB
1.0000 | INHALATION_SPRAY | Freq: Every day | RESPIRATORY_TRACT | Status: DC
  Administered 2018-07-16 – 2018-07-17 (×2): 1 via RESPIRATORY_TRACT
  Filled 2018-07-15: qty 28

## 2018-07-15 MED ORDER — UMECLIDINIUM BROMIDE 62.5 MCG/INH IN AEPB
1.0000 | INHALATION_SPRAY | Freq: Every day | RESPIRATORY_TRACT | Status: DC
  Administered 2018-07-16 – 2018-07-17 (×2): 1 via RESPIRATORY_TRACT
  Filled 2018-07-15: qty 7

## 2018-07-15 MED ORDER — COLCHICINE 0.6 MG PO TABS: 0.6000 mg | ORAL_TABLET | Freq: Every day | ORAL | Status: DC | PRN

## 2018-07-15 MED ORDER — ONDANSETRON HCL 4 MG PO TABS: 4.0000 mg | ORAL_TABLET | Freq: Four times a day (QID) | ORAL | Status: DC | PRN

## 2018-07-15 MED ORDER — FLUTICASONE-UMECLIDIN-VILANT 100-62.5-25 MCG/INH IN AEPB: 1.0000 | INHALATION_SPRAY | Freq: Every day | RESPIRATORY_TRACT | Status: DC

## 2018-07-15 NOTE — ED Notes (Signed)
Patient transported to CT 

## 2018-07-15 NOTE — ED Triage Notes (Addendum)
PT from [facility] with c/o CP since yesterday. PT was recently dx with pneumonia per EMS. PT is A&OX4, VSS . 2L o2 chronically

## 2018-07-15 NOTE — ED Provider Notes (Signed)
[facility] EMERGENCY DEPARTMENT Provider Note   CSN: [id number] Arrival date & time: 07/15/18  0820    History   Chief Complaint Chief Complaint  Patient presents with  . Chest Pain    HPI Kiki Bivens is a 78 y.o. female.     HPI   78 yo female with past medical history of COPD, CHF, CKD, chronic hypoxic respiratory failure, here with chest pain and shortness of breath.  The patient states that over the last several days, she had requested worsening shortness of breath and cough.  She believes she is currently being treated for pneumonia.  She reports that she has had left upper chest pain, which is also sharp, worse with movement and palpation.  She has had associated generalized weakness.  Denies any known fevers but has had some chills.  Her appetite is been down.  She is not believe she had any increase in her lower extremity edema or swelling.  She reports significant dyspnea with even small amounts of exertion.  No other complaints.  She is been using her breathing treatments without significant relief.  Past Medical History:  Diagnosis Date  . Angioedema   . Anxiety   . Anxiety and depression   . CHF (congestive heart failure) ([place])   . CHF (congestive heart failure) ([place])   . Chronic constipation   . Chronic kidney disease    stage 3  . COPD (chronic obstructive pulmonary disease) ([place])   . Diabetes mellitus without complication ([place])   . Gallstones   . GERD (gastroesophageal reflux disease)   . Hypertension   . Left ventricular hypertrophy   . Osteoarthritis   . Prurigo nodularis   . Seizures ([place])   . Stroke ([place])   . Tobacco abuse   . Vitamin D deficiency disease     Patient Active Problem List   Diagnosis Date Noted  . Palliative care encounter 05/10/2018  . Localized edema 05/10/2018  . Shortness of breath 05/10/2018  . Hematemesis 04/28/2018  . Influenza A 04/13/2018  . Acute on chronic congestive heart failure ([place])   .  COPD with acute exacerbation ([place]) 02/24/2018  . Chronic diastolic heart failure ([place]) 12/31/2017  . Lymphedema 12/31/2017  . COPD exacerbation ([place]) 11/30/2017  . Urinary tract infection 11/22/2017  . 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[place]) 11/16/2017  . Diabetes mellitus type 2, uncomplicated ([place]) 27/04/5007  . Pressure injury of skin 10/29/2017  . Acute kidney injury superimposed on CKD ([place]) 10/24/2017  . Severe sepsis ([place]) 10/24/2017  . Possible Seizures ([place]) 10/08/2017  . Hyperlipemia 10/08/2017  . 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 Stroke-like episode ([place]) s/p IV tpa 10/04/2017  . Palliative care by specialist   . Elevated rheumatoid factor 09/05/2017  . Frequent hospital admissions 08/22/2017  . Aphasia 06/28/2017  . Hand pain 03/21/2017  . Dry skin 03/21/2017  . Pain in finger of left hand 09/12/2016  . Chronic fatigue 06/12/2016  . Left knee pain 06/12/2016  . Goals of care, counseling/discussion 03/13/2016  . Primary localized osteoarthritis of right knee 02/08/2016  . OSA (obstructive sleep apnea) 09/16/2015  . Rotator cuff syndrome 09/07/2015  . 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 Sleep disturbance 04/14/2015  . Coronary artery disease 03/14/2015  . 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 Acute respiratory failure with hypoxia ([place]) 09/16/2014  . 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 Anxiety   . Tobacco abuse   . Prurigo nodularis   . GERD (gastroesophageal  reflux disease)   . COPD (chronic obstructive pulmonary disease) ([address])   . Osteoarthritis   . Vitamin D deficiency disease   . Chronic constipation   . CKD (chronic kidney disease), stage III ([place])   . Essential hypertension 05/20/2013  . Morbid obesity ([place]) 05/20/2013    Past Surgical History:  Procedure Laterality Date  . CHOLECYSTECTOMY    . 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   Procedure: TOTAL KNEE ARTHROPLASTY;  Surgeon: Hessie Knows, MD;  Location: [facility] ORS;  Service: Orthopedics;  Laterality: Right;     OB History   No obstetric history on file.      Home Medications    Prior to Admission medications   Medication Sig Start Date End Date Taking? Authorizing Provider  albuterol (ACCUNEB) 0.63 MG/3ML nebulizer solution Take 3 mLs by nebulization every 8 (eight) hours as needed for wheezing.    Yes [provider]  atorvastatin (LIPITOR) 80 MG tablet Take 80 mg by mouth every evening.    Yes [provider]  cetirizine (ZYRTEC) 10 MG tablet Take 5 mg by mouth at bedtime.   Yes [provider]  cholecalciferol (VITAMIN D) 1000 units tablet Take 1,000 Units by mouth daily.   Yes [provider]  clopidogrel (PLAVIX) 75 MG tablet Take 75 mg by mouth daily.   Yes [provider]  diclofenac sodium (VOLTAREN) 1 % GEL Apply 2 g topically every 12 (twelve) hours as needed (for left knee pain).    Yes [provider]  docusate sodium (COLACE) 100 MG capsule Take 100 mg by mouth 2 (two) times daily.   Yes [provider]  FLUoxetine (PROZAC) 40 MG capsule Take 40 mg by mouth daily.    Yes [provider]  Fluticasone-Umeclidin-Vilant 100-62.5-25 MCG/INH AEPB Inhale 1 puff into the lungs daily.   Yes [provider]  gabapentin (NEURONTIN) 300 MG capsule Take 300 mg by mouth at bedtime.   Yes [provider]  hydrALAZINE (APRESOLINE) 25 MG tablet Take 25 mg by mouth 3 (three) times daily. 06/19/18  Yes [provider]  ipratropium (ATROVENT) 0.03 % nasal spray Place 2 sprays into both nostrils 2 (two) times daily.    Yes [provider]  ipratropium-albuterol (DUONEB) 0.5-2.5 (3) MG/3ML SOLN Take 3 mLs by nebulization 2 (two) times daily.    Yes [provider]  levETIRAcetam (KEPPRA XR) 500 MG 24 hr tablet Take 1 tablet (500 mg total) by mouth daily. 11/19/17  Yes Venancio Poisson, NP  levofloxacin (LEVAQUIN)  750 MG tablet Take 750 mg by mouth daily. 07/09/18  Yes [provider]  linagliptin (TRADJENTA) 5 MG TABS tablet Take 5 mg by mouth daily.   Yes [provider]  Multiple Vitamin (MULTIVITAMIN WITH MINERALS) TABS tablet Take 1 tablet by mouth daily.   Yes [provider]  mupirocin ointment (BACTROBAN) 2 % Place 1 application into the nose 2 (two) times daily. 06/13/18  Yes Wieting, Richard, MD  nystatin (MYCOSTATIN) 100000 UNIT/ML suspension Take 5 mLs (500,000 Units total) by mouth 4 (four) times daily. 06/13/18  Yes Wieting, Richard, MD  pantoprazole (PROTONIX) 40 MG tablet Take 1 tablet (40 mg total) by mouth daily. 04/29/18  Yes Bettey Costa, MD  vitamin B-12 1000 MCG tablet Take 1 tablet (1,000 mcg total) by mouth daily. 04/30/18  Yes Mody, Ulice Bold, MD  acetaminophen (TYLENOL) 650 MG CR tablet Take 650 mg by mouth 3 (  three) times daily.     [provider]  albuterol (PROVENTIL HFA;VENTOLIN HFA) 108 (90 Base) MCG/ACT inhaler Inhale 2 puffs into the lungs every 4 (four) hours as needed for wheezing or shortness of breath.    [provider]  amoxicillin-clavulanate (AUGMENTIN) 875-125 MG tablet Take 1 tablet by mouth every 12 (twelve) hours. Patient not taking: Reported on 07/15/2018 06/13/18   Loletha Grayer, MD  colchicine 0.6 MG tablet Take 1 tablet (0.6 mg total) by mouth daily. 06/14/18   Loletha Grayer, MD  LORazepam (ATIVAN) 2 MG/ML injection Inject 0.5 mLs (1 mg total) into the muscle daily as needed for seizure. 06/13/18   Loletha Grayer, MD  meclizine (ANTIVERT) 25 MG tablet Take 25 mg by mouth every 12 (twelve) hours as needed for dizziness.     [provider]  predniSONE (DELTASONE) 5 MG tablet 4 tabs po day1; 3 tabs po day 2; 2 tabs po day3; 1 tab po day4; 1/2 tab po day5,6 Patient not taking: Reported on 07/15/2018 06/14/18   Loletha Grayer, MD    Family History Family History  Problem Relation Age of Onset  . Alcohol abuse Mother    . Sickle cell anemia Daughter   . Hypertension Son   . Cancer Neg Hx   . COPD Neg Hx   . Diabetes Neg Hx   . Heart disease Neg Hx   . Stroke Neg Hx     Social History Social History   Tobacco Use  . Smoking status: Former Smoker    Packs/day: 0.50    Years: 60.00    Pack years: 30.00    Types: Cigarettes  . Smokeless tobacco: Never Used  . Tobacco comment: quite 29mo ago-03/26/18  Substance Use Topics  . Alcohol use: No    Alcohol/week: 0.0 standard drinks    Comment: rare  . Drug use: No     Allergies   Bee venom and Enalapril maleate   Review of Systems Review of Systems  Constitutional: Positive for chills and fatigue. Negative for fever.  HENT: Negative for congestion and rhinorrhea.   Eyes: Negative for visual disturbance.  Respiratory: Positive for cough, shortness of breath and wheezing.   Cardiovascular: Positive for leg swelling. Negative for chest pain.  Gastrointestinal: Negative for abdominal pain, diarrhea, nausea and vomiting.  Genitourinary: Negative for dysuria and flank pain.  Musculoskeletal: Negative for neck pain and neck stiffness.  Skin: Negative for rash and wound.  Allergic/Immunologic: Negative for immunocompromised state.  Neurological: Positive for weakness. Negative for syncope and headaches.  All other systems reviewed and are negative.    Physical Exam Updated Vital Signs BP 119/80   Pulse 100   Temp 98 F (36.7 C) (Oral)   Resp 20   SpO2 93%   Physical Exam Vitals signs and nursing note reviewed.  Constitutional:      General: She is not in acute distress.    Appearance: She is well-developed.  HENT:     Head: Normocephalic and atraumatic.  Eyes:     Conjunctiva/sclera: Conjunctivae normal.  Neck:     Musculoskeletal: Neck supple.  Cardiovascular:     Rate and Rhythm: Normal rate and regular rhythm.     Heart sounds: Normal heart sounds. No murmur. No friction rub.  Pulmonary:     Effort: Tachypnea and respiratory  distress present.     Breath sounds: Wheezing present. No rales.     Comments: Diminished aeration b/l Abdominal:     General: There  is no distension.     Palpations: Abdomen is soft.     Tenderness: There is no abdominal tenderness.  Musculoskeletal:     Right lower leg: Edema (Trace) present.     Left lower leg: Edema (Trace) present.  Skin:    General: Skin is warm.     Capillary Refill: Capillary refill takes less than 2 seconds.  Neurological:     Mental Status: She is alert and oriented to person, place, and time.     Motor: No abnormal muscle tone.      ED Treatments / Results  Labs (all labs ordered are listed, but only abnormal results are displayed) Labs Reviewed  CBC WITH DIFFERENTIAL/PLATELET - Abnormal; Notable for the following components:      Result Value   RBC 3.78 (*)    Hemoglobin 10.1 (*)    HCT 32.4 (*)    RDW 19.5 (*)    Abs Immature Granulocytes 0.08 (*)    All other components within normal limits  COMPREHENSIVE METABOLIC PANEL - Abnormal; Notable for the following components:   Creatinine, Ser 1.67 (*)    Albumin 2.5 (*)    Alkaline Phosphatase 145 (*)    GFR calc non Af Amer 29 (*)    GFR calc Af Amer 34 (*)    All other components within normal limits  BLOOD GAS, VENOUS - Abnormal; Notable for the following components:   pO2, Ven 55.0 (*)    Bicarbonate 29.7 (*)    Acid-Base Excess 4.0 (*)    All other components within normal limits  SARS CORONAVIRUS 2 (HOSPITAL ORDER, [facility])  CULTURE, BLOOD (ROUTINE X 2)  CULTURE, BLOOD (ROUTINE X 2)  LACTIC ACID, PLASMA  LACTIC ACID, PLASMA  TROPONIN I  BRAIN NATRIURETIC PEPTIDE    EKG EKG Interpretation  Date/Time:  Monday July 15 2018 08:26:53 EDT Ventricular Rate:  101 PR Interval:    QRS Duration: 75 QT Interval:  334 QTC Calculation: 433 R Axis:   -23 Text Interpretation:  Sinus tachycardia Borderline left axis deviation Borderline T abnormalities,  anterior leads No significant change since last tracing Confirmed by Duffy Bruce [phone]) on 07/15/2018 10:25:36 AM   Radiology Ct Chest Wo Contrast  Result Date: 07/15/2018 CLINICAL DATA:  Chest pain EXAM: CT CHEST WITHOUT CONTRAST TECHNIQUE: Multidetector CT imaging of the chest was performed following the standard protocol without IV contrast. COMPARISON:  Plain film from earlier in the same day. FINDINGS: Cardiovascular: Atherosclerotic calcifications of the thoracic aorta are noted without aneurysmal dilatation. Coronary calcifications are seen. Very minimal pericardial effusion is noted. Mediastinum/Nodes: Thoracic inlet is within normal limits. No hilar or mediastinal adenopathy is noted. Lungs/Pleura: Small bilateral pleural effusions are noted. Mild bibasilar atelectasis is noted similar to that seen on prior plain film examination. No sizable parenchymal nodules are seen. Upper Abdomen: Changes consistent with prior cholecystectomy. Musculoskeletal: Degenerative changes of the thoracic spine are noted. IMPRESSION: Small bilateral pleural effusions and mild bibasilar atelectasis. Aortic Atherosclerosis (ICD10-I70.0). Electronically Signed   By: [name] Catalina M.D.   On: 07/15/2018 11:13   Dg Chest Port 1 View  Result Date: 07/15/2018 CLINICAL DATA:  Chest pain EXAM: PORTABLE CHEST 1 VIEW COMPARISON:  06/11/2018 FINDINGS: Cardiac shadow is within normal limits. Mild atelectasis is noted in the bases bilaterally stable from the prior exam. No new focal infiltrate or effusion is seen. No acute bony abnormality is noted. Aortic calcifications are seen. IMPRESSION: Stable bibasilar atelectasis. Electronically Signed  By: [name] Catalina M.D.   On: 07/15/2018 09:32    Procedures Procedures (including critical care time)  Medications Ordered in ED Medications  methylPREDNISolone sodium succinate (SOLU-MEDROL) 125 mg/2 mL injection 125 mg (has no administration in time range)  albuterol (VENTOLIN HFA)  108 (90 Base) MCG/ACT inhaler 4 puff (has no administration in time range)  ipratropium (ATROVENT HFA) inhaler 2 puff (has no administration in time range)  albuterol (VENTOLIN HFA) 108 (90 Base) MCG/ACT inhaler 4 puff (4 puffs Inhalation Given 07/15/18 1118)     Initial Impression / Assessment and Plan / ED Course  I have reviewed the triage vital signs and the nursing notes.  Pertinent labs & imaging results that were available during my care of the patient were reviewed by me and considered in my medical decision making (see chart for details).        78 year old female here with shortness of breath and chest pain.  Chest pain is both dull as well as pressure-like, and worse with deep inspiration.  Exam is consistent with diffuse wheezing consistent with known COPD.  She is mildly tachycardic.  CT scan without contrast obtained to evaluate for underlying pneumonia and shows bilateral pleural effusions and mild bibasilar atelectasis.  Suspect there could be a component of pleurisy related to the atelectasis and effusions, as well as underlying COPD.  Remains tachycardic and tachypneic. Will admit for CP/SOB with tachypnea, concern for COPD exacerbation.  Re: her tachycardia - could be 2/2 her WOB and albuterol, but given her h/o DVT in past per records, will repeat U/S DVT of bilateral LE. Not on blood thinners per records, other than Plavix. Cannot obtain CT Angio given GFR - may need TTE if positive. H/o positive D-dimers in past so may nto be useful.  Final Clinical Impressions(s) / ED Diagnoses   Final diagnoses:  Atypical chest pain  COPD exacerbation [facility])    ED Discharge Orders    None       Duffy Bruce, MD 07/15/18 1333

## 2018-07-15 NOTE — ED Notes (Signed)
Lab at bedside for blood draw.

## 2018-07-15 NOTE — ED Notes (Signed)
Patient back from nuclear med

## 2018-07-15 NOTE — Progress Notes (Signed)
PHARMACY NOTE:  ANTIMICROBIAL RENAL DOSAGE ADJUSTMENT  Current antimicrobial regimen includes a mismatch between antimicrobial dosage and estimated renal function.  As per policy approved by the Pharmacy & Therapeutics and Medical Executive Committees, the antimicrobial dosage will be adjusted accordingly.  Current antimicrobial dosage:  Levaquin 750 mg PO Q24H   Indication:   Renal Function:  Estimated Creatinine Clearance: 34.7 mL/min (A) (by C-G formula based on SCr of 1.67 mg/dL (H)). []      On intermittent HD, scheduled: []      On CRRT    Antimicrobial dosage has been changed to:  levaquin 750 mg PO Q48H to start 6/2 @ 0800.  Additional comments:   Thank you for allowing pharmacy to be a part of this patient's care.  Rudy Luhmann D, [facility] 07/15/2018 9:04 PM

## 2018-07-15 NOTE — ED Notes (Signed)
Patient transported to nuclear med.

## 2018-07-15 NOTE — Progress Notes (Signed)
Anticoagulation monitoring(Lovenox):  78 yo female ordered Lovenox 40 mg Q24h  Filed Weights   07/15/18 1900  Weight: 242 lb 3.2 oz (109.9 kg)   BMI 41    Lab Results  Component Value Date   CREATININE 1.67 (H) 07/15/2018   CREATININE 1.53 (H) 06/12/2018   CREATININE 1.61 (H) 06/11/2018   Estimated Creatinine Clearance: 34.7 mL/min (A) (by C-G formula based on SCr of 1.67 mg/dL (H)). Hemoglobin & Hematocrit     Component Value Date/Time   HGB 10.1 (L) 07/15/2018 0926   HGB 12.6 03/27/2017 0948   HCT 32.4 (L) 07/15/2018 0926   HCT 37.9 03/27/2017 0948     Per Protocol for Patient with estCrcl > 30 ml/min and BMI > 40, will transition to Lovenox 40 mg Q12h.

## 2018-07-15 NOTE — H&P (Signed)
[facility] NAME: Katherine [name]    MR#:  [id number]  DATE OF BIRTH:  May 01, 1940  DATE OF ADMISSION:  07/15/2018  PRIMARY CARE PHYSICIAN: Valerie Roys, DO   REQUESTING/REFERRING PHYSICIAN: Dr. Fonnie Birkenhead  CHIEF COMPLAINT:   Chief Complaint  Patient presents with  . Chest Pain    HISTORY OF PRESENT ILLNESS:  Katherine [name]  is a 78 y.o. female with a known history of arthritis who is wheelchair-bound at baseline, seizure disorder, hypertension, diabetes, COPD, CKD stage III, diastolic CHF from [facility] brought in for chest pain. Patient states he has been bedbound secondary to worsening arthritis especially in her left knee and has been at [facility] for almost a year.  She has intermittent episodes of chest pain that eased by themselves.  But this time since last night she has been having chest pain and not improving.  She was recently diagnosed with pneumonia and is been on antibiotics.  She is on chronic 2 L oxygen.  Denies any nausea or vomiting.  No fevers or chills.  Denies any cough.  She was noted to be tachypneic with respiratory rate in the high 20s and tachycardic initially.  Received neb treatments and Solu-Medrol in the ED and feels some better.  Labs are otherwise within normal limits.  BNP is less than 101st set of troponin is negative.  Chest x-ray showing stable bibasilar atelectasis.  PAST MEDICAL HISTORY:   Past Medical History:  Diagnosis Date  . Angioedema   . Anxiety   . Anxiety and depression   . CHF (congestive heart failure) ([place])   . CHF (congestive heart failure) ([place])   . Chronic constipation   . Chronic kidney disease    stage 3  . COPD (chronic obstructive pulmonary disease) ([place])   . Diabetes mellitus without complication ([place])   . Gallstones   . GERD (gastroesophageal reflux disease)   . Hypertension   . Left ventricular hypertrophy   . Osteoarthritis    . Prurigo nodularis   . Seizures ([place])   . Stroke ([place])   . Tobacco abuse   . Vitamin D deficiency disease     PAST SURGICAL HISTORY:   Past Surgical History:  Procedure Laterality Date  . CHOLECYSTECTOMY    . 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   Procedure: TOTAL KNEE ARTHROPLASTY;  Surgeon: Hessie Knows, MD;  Location: [facility] ORS;  Service: Orthopedics;  Laterality: Right;    SOCIAL HISTORY:   Social History   Tobacco Use  . Smoking status: Former Smoker    Packs/day: 0.50    Years: 60.00    Pack years: 30.00    Types: Cigarettes  . Smokeless tobacco: Never Used  . Tobacco comment: quite 24mo ago-03/26/18  Substance Use Topics  . Alcohol use: No    Alcohol/week: 0.0 standard drinks    Comment: rare    FAMILY HISTORY:   Family History  Problem Relation Age of Onset  . Alcohol abuse Mother   . Sickle cell anemia Daughter   . Hypertension Son   . Cancer Neg Hx   . COPD Neg Hx   . Diabetes Neg Hx   . Heart disease Neg Hx   . Stroke Neg Hx     DRUG ALLERGIES:   Allergies  Allergen Reactions  . Bee Venom Swelling  . Enalapril Maleate Swelling    REVIEW  OF SYSTEMS:   Review of Systems  Constitutional: Negative for chills, fever, malaise/fatigue and weight loss.  HENT: Negative for ear discharge, ear pain, hearing loss, nosebleeds and tinnitus.   Eyes: Negative for blurred vision, double vision and photophobia.  Respiratory: Negative for cough, hemoptysis, shortness of breath and wheezing.   Cardiovascular: Positive for chest pain. Negative for palpitations, orthopnea and leg swelling.  Gastrointestinal: Negative for abdominal pain, constipation, diarrhea, heartburn, melena, nausea and vomiting.  Genitourinary: Negative for dysuria, frequency, hematuria and urgency.  Musculoskeletal: Positive for myalgias. Negative for back pain and neck pain.  Skin: Negative for rash.  Neurological: Negative for dizziness,  tingling, tremors, sensory change, speech change, focal weakness and headaches.  Endo/Heme/Allergies: Does not bruise/bleed easily.  Psychiatric/Behavioral: Negative for depression.    MEDICATIONS AT HOME:   Prior to Admission medications   Medication Sig Start Date End Date Taking? Authorizing Provider  albuterol (ACCUNEB) 0.63 MG/3ML nebulizer solution Take 3 mLs by nebulization every 8 (eight) hours as needed for wheezing.    Yes [provider]  atorvastatin (LIPITOR) 80 MG tablet Take 80 mg by mouth every evening.    Yes [provider]  cetirizine (ZYRTEC) 10 MG tablet Take 5 mg by mouth at bedtime.   Yes [provider]  cholecalciferol (VITAMIN D) 1000 units tablet Take 1,000 Units by mouth daily.   Yes [provider]  clopidogrel (PLAVIX) 75 MG tablet Take 75 mg by mouth daily.   Yes [provider]  diclofenac sodium (VOLTAREN) 1 % GEL Apply 2 g topically every 12 (twelve) hours as needed (for left knee pain).    Yes [provider]  docusate sodium (COLACE) 100 MG capsule Take 100 mg by mouth 2 (two) times daily.   Yes [provider]  FLUoxetine (PROZAC) 40 MG capsule Take 40 mg by mouth daily.    Yes [provider]  Fluticasone-Umeclidin-Vilant 100-62.5-25 MCG/INH AEPB Inhale 1 puff into the lungs daily.   Yes [provider]  gabapentin (NEURONTIN) 300 MG capsule Take 300 mg by mouth at bedtime.   Yes [provider]  hydrALAZINE (APRESOLINE) 25 MG tablet Take 25 mg by mouth 3 (three) times daily. 06/19/18  Yes [provider]  ipratropium (ATROVENT) 0.03 % nasal spray Place 2 sprays into both nostrils 2 (two) times daily.    Yes [provider]  ipratropium-albuterol (DUONEB) 0.5-2.5 (3) MG/3ML SOLN Take 3 mLs by nebulization 2 (two) times daily.    Yes [provider]  levETIRAcetam (KEPPRA XR) 500 MG 24 hr tablet Take 1 tablet (500 mg total) by mouth daily.  11/19/17  Yes Venancio Poisson, NP  levofloxacin (LEVAQUIN) 750 MG tablet Take 750 mg by mouth daily. 07/09/18  Yes [provider]  linagliptin (TRADJENTA) 5 MG TABS tablet Take 5 mg by mouth daily.   Yes [provider]  Multiple Vitamin (MULTIVITAMIN WITH MINERALS) TABS tablet Take 1 tablet by mouth daily.   Yes [provider]  mupirocin ointment (BACTROBAN) 2 % Place 1 application into the nose 2 (two) times daily. 06/13/18  Yes Wieting, Richard, MD  nystatin (MYCOSTATIN) 100000 UNIT/ML suspension Take 5 mLs (500,000 Units total) by mouth 4 (four) times daily. 06/13/18  Yes Wieting, Richard, MD  pantoprazole (PROTONIX) 40 MG tablet Take 1 tablet (40 mg total) by mouth daily. 04/29/18  Yes Bettey Costa, MD  vitamin B-12 1000 MCG tablet Take 1 tablet (1,000 mcg total) by mouth daily. 04/30/18  Yes Mody,  Sital, MD  acetaminophen (TYLENOL) 650 MG CR tablet Take 650 mg by mouth 3 (three) times daily.     [provider]  albuterol (PROVENTIL HFA;VENTOLIN HFA) 108 (90 Base) MCG/ACT inhaler Inhale 2 puffs into the lungs every 4 (four) hours as needed for wheezing or shortness of breath.    [provider]  amoxicillin-clavulanate (AUGMENTIN) 875-125 MG tablet Take 1 tablet by mouth every 12 (twelve) hours. Patient not taking: Reported on 07/15/2018 06/13/18   Loletha Grayer, MD  colchicine 0.6 MG tablet Take 1 tablet (0.6 mg total) by mouth daily. 06/14/18   Loletha Grayer, MD  LORazepam (ATIVAN) 2 MG/ML injection Inject 0.5 mLs (1 mg total) into the muscle daily as needed for seizure. 06/13/18   Loletha Grayer, MD  meclizine (ANTIVERT) 25 MG tablet Take 25 mg by mouth every 12 (twelve) hours as needed for dizziness.     [provider]  predniSONE (DELTASONE) 5 MG tablet 4 tabs po day1; 3 tabs po day 2; 2 tabs po day3; 1 tab po day4; 1/2 tab po day5,6 Patient not taking: Reported on 07/15/2018 06/14/18   Loletha Grayer, MD      VITAL SIGNS:  Blood  pressure 119/80, pulse 100, temperature 98 F (36.7 C), temperature source Oral, resp. rate 20, SpO2 93 %.  PHYSICAL EXAMINATION:  Physical Exam  GENERAL:  77 y.o.-year-old patient lying in the bed with no acute distress.  EYES: Pupils equal, round, reactive to light and accommodation. No scleral icterus. Extraocular muscles intact.  HEENT: Head atraumatic, normocephalic. Oropharynx and nasopharynx clear.  NECK:  Supple, no jugular venous distention. No thyroid enlargement, no tenderness.  LUNGS: Normal breath sounds bilaterally, no wheezing, rales,rhonchi or crepitation. No use of accessory muscles of respiration.  CARDIOVASCULAR: S1, S2 normal. No murmurs, rubs, or gallops.  No chest wall tenderness. ABDOMEN: Soft, nontender, nondistended. Bowel sounds present. No organomegaly or mass.  EXTREMITIES: No  cyanosis, or clubbing.  1+ bilateral pedal edema noted NEUROLOGIC: Cranial nerves II through XII are intact. Muscle strength equal in both upper  extremities.  Weaker in lower extremities.  Bedbound at baseline.  Sensation intact. Gait not checked.  PSYCHIATRIC: The patient is alert and oriented x 3.  SKIN: No obvious rash, lesion, or ulcer.   LABORATORY PANEL:   CBC Recent Labs  Lab 07/15/18 0926  WBC 6.5  HGB 10.1*  HCT 32.4*  PLT 347   ------------------------------------------------------------------------------------------------------------------  Chemistries  Recent Labs  Lab 07/15/18 0926  NA 138  K 4.3  CL 105  CO2 25  GLUCOSE 95  BUN 15  CREATININE 1.67*  CALCIUM 10.1  AST 31  ALT 19  ALKPHOS 145*  BILITOT 0.7   ------------------------------------------------------------------------------------------------------------------  Cardiac Enzymes Recent Labs  Lab 07/15/18 0926  TROPONINI <0.03   ------------------------------------------------------------------------------------------------------------------  RADIOLOGY:  Ct Chest Wo Contrast  Result  Date: 07/15/2018 CLINICAL DATA:  Chest pain EXAM: CT CHEST WITHOUT CONTRAST TECHNIQUE: Multidetector CT imaging of the chest was performed following the standard protocol without IV contrast. COMPARISON:  Plain film from earlier in the same day. FINDINGS: Cardiovascular: Atherosclerotic calcifications of the thoracic aorta are noted without aneurysmal dilatation. Coronary calcifications are seen. Very minimal pericardial effusion is noted. Mediastinum/Nodes: Thoracic inlet is within normal limits. No hilar or mediastinal adenopathy is noted. Lungs/Pleura: Small bilateral pleural effusions are noted. Mild bibasilar atelectasis is noted similar to that seen on prior plain film examination. No sizable parenchymal nodules are seen. Upper Abdomen: Changes consistent with prior cholecystectomy. Musculoskeletal:  Degenerative changes of the thoracic spine are noted. IMPRESSION: Small bilateral pleural effusions and mild bibasilar atelectasis. Aortic Atherosclerosis (ICD10-I70.0). Electronically Signed   By: [name] Catalina M.D.   On: 07/15/2018 11:13   Dg Chest Port 1 View  Result Date: 07/15/2018 CLINICAL DATA:  Chest pain EXAM: PORTABLE CHEST 1 VIEW COMPARISON:  06/11/2018 FINDINGS: Cardiac shadow is within normal limits. Mild atelectasis is noted in the bases bilaterally stable from the prior exam. No new focal infiltrate or effusion is seen. No acute bony abnormality is noted. Aortic calcifications are seen. IMPRESSION: Stable bibasilar atelectasis. Electronically Signed   By: [name] Catalina M.D.   On: 07/15/2018 09:32      IMPRESSION AND PLAN:   Katherine [name]  is a 78 y.o. female with a known history of arthritis who is wheelchair-bound at baseline, seizure disorder, hypertension, diabetes, COPD, CKD stage III, diastolic CHF from [facility] brought in for chest pain.  1.  Chest pain-atypical, could be chest tightness from COPD -We will admit to telemetry, monitor -Troponins x3, acid is  negative. -Continue nebs, started on Solu-Medrol. -CT chest without contrast showing bibasilar atelectasis.  No pneumonia identified -D-dimer has been ordered.  VQ scan given her tachycardia and tachypnea on admission. -Dopplers ordered to rule out DVT.  2.  History of seizure disorder-continue Keppra.  No seizures.  3.  CKD stage III-seems to be at baseline.  Monitor  4.  Diabetes mellitus-on linagliptin.  Add sliding scale insulin  5.  Hypertension-continue home medications.  6.  DVT prophylaxis-Lovenox  Patient is wheelchair-bound at baseline.   All the records are reviewed and case discussed with ED provider. Management plans discussed with the patient, family and they are in agreement.  CODE STATUS: Full Code  TOTAL TIME TAKING CARE OF THIS PATIENT: 51 minutes.    Gladstone Lighter M.D on 07/15/2018 at 3:54 PM  Between 7am to 6pm - Pager - [phone]  After 6pm go to [email or web address] - Proofreader  [facility]  Office  [phone]  CC: Primary care physician; Valerie Roys, DO   Note: This dictation was prepared with Dragon dictation along with smaller phrase technology. Any transcriptional errors that result from this process are unintentional.

## 2018-07-15 NOTE — ED Notes (Signed)
This RN attempted x2 and Maudie Mercury, RN attempted x1 for blood collect

## 2018-07-15 NOTE — ED Notes (Signed)
ED TO INPATIENT HANDOFF REPORT  ED Nurse Name and Phone #: Kyeisha Janowicz 53  S Name/Age/Gender Katherine [name] 78 y.o. female Room/Bed: ED02A/ED02A  Code Status   Code Status: Prior  Home/SNF/Other Skilled nursing facility Patient oriented to: self, place, time, situation Is this baseline? Yes   Triage Complete: Triage complete  Chief Complaint chest pain  Triage Note PT from [place] with c/o CP since yesterday. PT was recently dx with pneumonia per EMS. PT is A&OX4, VSS . 2L o2 chronically    Allergies Allergies  Allergen Reactions  . Bee Venom Swelling  . Enalapril Maleate Swelling    Level of Care/Admitting Diagnosis ED Disposition    ED Disposition Condition [facility]: [place] [[id number]]  Level of Care: Telemetry [5]  Covid Evaluation: N/A  Diagnosis: Chest pain [761607]  Admitting Physician: Gladstone Lighter [[id number]]  Attending Physician: Gladstone Lighter [phone]  PT Class (Do Not Modify): Observation [104]  PT Acc Code (Do Not Modify): Observation [10022]       B Medical/Surgery History Past Medical History:  Diagnosis Date  . Angioedema   . Anxiety   . Anxiety and depression   . CHF (congestive heart failure) ([place])   . CHF (congestive heart failure) ([place])   . Chronic constipation   . Chronic kidney disease    stage 3  . COPD (chronic obstructive pulmonary disease) ([place])   . Diabetes mellitus without complication ([place])   . Gallstones   . GERD (gastroesophageal reflux disease)   . Hypertension   . Left ventricular hypertrophy   . Osteoarthritis   . Prurigo nodularis   . Seizures ([place])   . Stroke ([place])   . Tobacco abuse   . Vitamin D deficiency disease    Past Surgical History:  Procedure Laterality Date  . CHOLECYSTECTOMY    . 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   Procedure: TOTAL KNEE ARTHROPLASTY;  Surgeon: Hessie Knows, MD;  Location:  [facility] ORS;  Service: Orthopedics;  Laterality: Right;     A IV Location/Drains/Wounds Patient Lines/Drains/Airways Status   Active Line/Drains/Airways    Name:   Placement date:   Placement time:   Site:   Days:   Peripheral IV 07/15/18 Right Hand   07/15/18    0834    Hand   less than 1          Intake/Output Last 24 hours No intake or output data in the 24 hours ending 07/15/18 1710  Labs/Imaging Results for orders placed or performed during the hospital encounter of 07/15/18 (from the past 48 hour(s))  SARS Coronavirus 2 (CEPHEID- Performed in [place] hospital lab), Hosp Order     Status: None   Collection Time: 07/15/18  9:15 AM  Result Value Ref Range   SARS Coronavirus 2 NEGATIVE NEGATIVE    Comment: (NOTE) If result is NEGATIVE SARS-CoV-2 target nucleic acids are NOT DETECTED. The SARS-CoV-2 RNA is generally detectable in upper and lower  respiratory specimens during the acute phase of infection. The lowest  concentration of SARS-CoV-2 viral copies this assay can detect is 250  copies / mL. A negative result does not preclude SARS-CoV-2 infection  and should not be used as the sole basis for treatment or other  patient management decisions.  A negative result may occur with  improper specimen collection / handling, submission of specimen other  than nasopharyngeal swab, presence of viral mutation(s) within  the  areas targeted by this assay, and inadequate number of viral copies  (<250 copies / mL). A negative result must be combined with clinical  observations, patient history, and epidemiological information. If result is POSITIVE SARS-CoV-2 target nucleic acids are DETECTED. The SARS-CoV-2 RNA is generally detectable in upper and lower  respiratory specimens dur ing the acute phase of infection.  Positive  results are indicative of active infection with SARS-CoV-2.  Clinical  correlation with patient history and other diagnostic information is  necessary to  determine patient infection status.  Positive results do  not rule out bacterial infection or co-infection with other viruses. If result is PRESUMPTIVE POSTIVE SARS-CoV-2 nucleic acids MAY BE PRESENT.   A presumptive positive result was obtained on the submitted specimen  and confirmed on repeat testing.  While 2019 novel coronavirus  (SARS-CoV-2) nucleic acids may be present in the submitted sample  additional confirmatory testing may be necessary for epidemiological  and / or clinical management purposes  to differentiate between  SARS-CoV-2 and other Sarbecovirus currently known to infect humans.  If clinically indicated additional testing with an alternate test  methodology [phone]) is advised. The SARS-CoV-2 RNA is generally  detectable in upper and lower respiratory sp ecimens during the acute  phase of infection. The expected result is Negative. Fact Sheet for Patients:  StrictlyIdeas.no Fact Sheet for Healthcare Providers: BankingDealers.co.za This test is not yet approved or cleared by the [place] FDA and has been authorized for detection and/or diagnosis of SARS-CoV-2 by FDA under an Emergency Use Authorization (EUA).  This EUA will remain in effect (meaning this test can be used) for the duration of the COVID-19 declaration under Section 564(b)(1) of the Act, 21 U.S.C. section 360bbb-3(b)(1), unless the authorization is terminated or revoked sooner. Performed at [facility], [address] [place], [place] [address]   CBC with Differential     Status: Abnormal   Collection Time: 07/15/18  9:26 AM  Result Value Ref Range   WBC 6.5 4.0 - 10.5 K/uL   RBC 3.78 (L) 3.87 - 5.11 MIL/uL   Hemoglobin 10.1 (L) 12.0 - 15.0 g/dL   HCT 32.4 (L) 36.0 - 46.0 %   MCV 85.7 80.0 - 100.0 fL   MCH 26.7 26.0 - 34.0 pg   MCHC 31.2 30.0 - 36.0 g/dL   RDW 19.5 (H) 11.5 - 15.5 %   Platelets 347 150 - 400 K/uL   nRBC 0.0 0.0 -  0.2 %   Neutrophils Relative % 68 %   Neutro Abs 4.5 1.7 - 7.7 K/uL   Lymphocytes Relative 16 %   Lymphs Abs 1.0 0.7 - 4.0 K/uL   Monocytes Relative 12 %   Monocytes Absolute 0.8 0.1 - 1.0 K/uL   Eosinophils Relative 2 %   Eosinophils Absolute 0.1 0.0 - 0.5 K/uL   Basophils Relative 1 %   Basophils Absolute 0.0 0.0 - 0.1 K/uL   Immature Granulocytes 1 %   Abs Immature Granulocytes 0.08 (H) 0.00 - 0.07 K/uL    Comment: Performed at [facility], [address] [place], [place] [address]  Comprehensive metabolic panel     Status: Abnormal   Collection Time: 07/15/18  9:26 AM  Result Value Ref Range   Sodium 138 135 - 145 mmol/L   Potassium 4.3 3.5 - 5.1 mmol/L   Chloride 105 98 - 111 mmol/L   CO2 25 22 - 32 mmol/L   Glucose, Bld 95 70 - 99 mg/dL  BUN 15 8 - 23 mg/dL   Creatinine, Ser 1.67 (H) 0.44 - 1.00 mg/dL   Calcium 10.1 8.9 - 10.3 mg/dL   Total Protein 7.2 6.5 - 8.1 g/dL   Albumin 2.5 (L) 3.5 - 5.0 g/dL   AST 31 15 - 41 U/L   ALT 19 0 - 44 U/L   Alkaline Phosphatase 145 (H) 38 - 126 U/L   Total Bilirubin 0.7 0.3 - 1.2 mg/dL   GFR calc non Af Amer 29 (L) >60 mL/min   GFR calc Af Amer 34 (L) >60 mL/min   Anion gap 8 5 - 15    Comment: Performed at [facility], [address] [place], [place] [address]  Lactic acid, plasma     Status: None   Collection Time: 07/15/18  9:26 AM  Result Value Ref Range   Lactic Acid, Venous 1.1 0.5 - 1.9 mmol/L    Comment: Performed at [facility], [address] [place], [place] [address]  Troponin I - ONCE - STAT     Status: None   Collection Time: 07/15/18  9:26 AM  Result Value Ref Range   Troponin I <0.03 <0.03 ng/mL    Comment: Performed at [facility], [address] [place], [place] [address]  Brain natriuretic peptide     Status: None   Collection Time: 07/15/18  9:26 AM  Result Value Ref Range   B Natriuretic Peptide 92.0 0.0 - 100.0 pg/mL    Comment: Performed at [facility], [address] [place], [place] [address]  Blood gas, venous     Status: Abnormal   Collection Time: 07/15/18 10:15 AM  Result Value Ref Range   pH, Ven 7.40 7.250 - 7.430   pCO2, Ven 48 44.0 - 60.0 mmHg   pO2, Ven 55.0 (H) 32.0 - 45.0 mmHg   Bicarbonate 29.7 (H) 20.0 - 28.0 mmol/L   Acid-Base Excess 4.0 (H) 0.0 - 2.0 mmol/L   O2 Saturation 88.2 %   Patient temperature 37.0    Collection site VENOUS    Sample type VENOUS     Comment: Performed at [facility], [address] [place], [place] [address]  Lactic acid, plasma     Status: None   Collection Time: 07/15/18 11:28 AM  Result Value Ref Range   Lactic Acid, Venous 1.1 0.5 - 1.9 mmol/L    Comment: Performed at [facility], [place],  [address]   Ct Chest Wo Contrast  Result Date: 07/15/2018 CLINICAL DATA:  Chest pain EXAM: CT CHEST WITHOUT CONTRAST TECHNIQUE: Multidetector CT imaging of the chest was performed following the standard protocol without IV contrast. COMPARISON:  Plain film from earlier in the same day. FINDINGS: Cardiovascular: Atherosclerotic calcifications of the thoracic aorta are noted without aneurysmal dilatation. Coronary calcifications are seen. Very minimal pericardial effusion is noted. Mediastinum/Nodes: Thoracic inlet is within normal limits. No hilar or mediastinal adenopathy is noted. Lungs/Pleura: Small bilateral pleural effusions are noted. Mild bibasilar atelectasis is noted similar to that seen on prior plain film examination. No sizable parenchymal nodules are seen. Upper Abdomen: Changes consistent with prior cholecystectomy. Musculoskeletal: Degenerative changes of the thoracic spine are noted. IMPRESSION: Small bilateral pleural effusions and mild bibasilar atelectasis. Aortic Atherosclerosis (ICD10-I70.0). Electronically Signed   By: [name] Catalina M.D.   On: 07/15/2018 11:13   Nm Pulmonary Perf And Vent  Result Date: 07/15/2018 CLINICAL DATA:   Shortness of breath EXAM: NUCLEAR MEDICINE VENTILATION -  PERFUSION LUNG SCAN VIEWS: Anterior, posterior, RAO, LAO, RPO, LPO-ventilation and perfusion RADIOPHARMACEUTICALS:  31.08 mCi of Tc-61m DTPA aerosol inhalation and 4.12 mCi Tc50m MAA IV COMPARISON:  Chest radiograph and chest CT July 15, 2018 FINDINGS: Ventilation: There is essentially homogeneous and symmetric uptake of radiotracer bilaterally. No focal ventilation defects are evident. Perfusion: Radiotracer uptake is essentially homogeneous and symmetric bilaterally. No appreciable perfusion defects evident. IMPRESSION: No appreciable ventilation or perfusion defects. Very low probability of pulmonary embolus. Electronically Signed   By: Lowella Grip III M.D.   On: 07/15/2018 16:27   Dg Chest Port 1 View  Result Date: 07/15/2018 CLINICAL DATA:  Chest pain EXAM: PORTABLE CHEST 1 VIEW COMPARISON:  06/11/2018 FINDINGS: Cardiac shadow is within normal limits. Mild atelectasis is noted in the bases bilaterally stable from the prior exam. No new focal infiltrate or effusion is seen. No acute bony abnormality is noted. Aortic calcifications are seen. IMPRESSION: Stable bibasilar atelectasis. Electronically Signed   By: [name] Catalina M.D.   On: 07/15/2018 09:32    Pending Labs Unresulted Labs (From admission, onward)    Start     Ordered   07/15/18 1421  Fibrin derivatives D-Dimer ([facility] only)  Once,   STAT     07/15/18 1420   07/15/18 0905  Blood culture (routine x 2)  BLOOD CULTURE X 2,   STAT    Question:  Patient immune status  Answer:  Normal   07/15/18 0905   Signed and Held  Basic metabolic panel  Tomorrow morning,   R     Signed and Held   Signed and Held  CBC  Tomorrow morning,   R     Signed and Held   Signed and Held  Troponin I - Now Then Q6H  Now then every 6 hours,   R     Signed and Held          Vitals/Pain Today's Vitals   07/15/18 1023 07/15/18 1130 07/15/18 1200 07/15/18 1230  BP:  (!) 146/79 122/84 119/80  Pulse:   100 (!) 102 100  Resp: (!) 25   20  Temp:      TempSrc:      SpO2:  94% 96% 93%  PainSc:        Isolation Precautions No active isolations  Medications Medications  ipratropium (ATROVENT HFA) inhaler 2 puff (has no administration in time range)  albuterol (VENTOLIN HFA) 108 (90 Base) MCG/ACT inhaler 4 puff (4 puffs Inhalation Given 07/15/18 1118)  methylPREDNISolone sodium succinate (SOLU-MEDROL) 125 mg/2 mL injection 125 mg (125 mg Intravenous Given 07/15/18 1334)  albuterol (VENTOLIN HFA) 108 (90 Base) MCG/ACT inhaler 4 puff (4 puffs Inhalation Given 07/15/18 1334)  technetium TC 26M diethylenetriame-pentaacetic acid (DTPA) injection 30 millicurie (31.51 millicuries Inhalation Given 07/15/18 1510)  technetium albumin aggregated (MAA) injection solution 4 millicurie (7.61 millicuries Intravenous Contrast Given 07/15/18 1528)    Mobility walks with device High fall risk   Focused Assessments Cardiac Assessment Handoff:  Cardiac Rhythm: Sinus tachycardia(hr 104) Lab Results  Component Value Date   CKTOTAL 59 08/27/2017   CKMB 0.5 01/22/2013   TROPONINI <0.03 07/15/2018   No results found for: DDIMER Does the Patient currently have chest pain? No     R Recommendations: See Admitting Provider Note  Report given to:   Additional Notes:  Patient A&O x4. Intermittently complaining of leg pain in left shin. US done for rule out DVT.

## 2018-07-15 NOTE — ED Notes (Signed)
Lab called at this time for lab draw, pt difficult stick

## 2018-07-16 ENCOUNTER — Observation Stay (HOSPITAL_BASED_OUTPATIENT_CLINIC_OR_DEPARTMENT_OTHER)
Admit: 2018-07-16 | Discharge: 2018-07-16 | Disposition: A | Discharge status: Home / Self Care | Payer: Medicare HMO | Attending: Internal Medicine | Admitting: Internal Medicine

## 2018-07-16 ENCOUNTER — Encounter: Payer: Self-pay | Admitting: *Deleted

## 2018-07-16 DIAGNOSIS — J441 Chronic obstructive pulmonary disease with (acute) exacerbation: Secondary | ICD-10-CM | POA: Diagnosis not present

## 2018-07-16 DIAGNOSIS — R079 Chest pain, unspecified: Secondary | ICD-10-CM

## 2018-07-16 DIAGNOSIS — A498 Other bacterial infections of unspecified site: Secondary | ICD-10-CM | POA: Diagnosis not present

## 2018-07-16 DIAGNOSIS — M1990 Unspecified osteoarthritis, unspecified site: Secondary | ICD-10-CM | POA: Diagnosis not present

## 2018-07-16 DIAGNOSIS — N183 Chronic kidney disease, stage 3 (moderate): Secondary | ICD-10-CM | POA: Diagnosis not present

## 2018-07-16 LAB — GASTROINTESTINAL PANEL BY PCR, STOOL (REPLACES STOOL CULTURE)

## 2018-07-16 LAB — CLOSTRIDIUM DIFFICILE BY PCR, REFLEXED: Toxigenic C. Difficile by PCR: NEGATIVE

## 2018-07-16 LAB — GLUCOSE, CAPILLARY
Glucose-Capillary: 158 mg/dL — ABNORMAL HIGH (ref 70–99)
Glucose-Capillary: 216 mg/dL — ABNORMAL HIGH (ref 70–99)
Glucose-Capillary: 150 mg/dL — ABNORMAL HIGH (ref 70–99)
Glucose-Capillary: 143 mg/dL — ABNORMAL HIGH (ref 70–99)

## 2018-07-16 LAB — URINALYSIS, COMPLETE (UACMP) WITH MICROSCOPIC
Bilirubin Urine: NEGATIVE
Glucose, UA: NEGATIVE mg/dL
Hgb urine dipstick: NEGATIVE
Ketones, ur: NEGATIVE mg/dL
Nitrite: NEGATIVE
Protein, ur: 30 mg/dL — AB
Specific Gravity, Urine: 1.015 (ref 1.005–1.030)
WBC, UA: >50 WBC/hpf — ABNORMAL HIGH (ref 0–5)
pH: 5 (ref 5.0–8.0)

## 2018-07-16 LAB — BASIC METABOLIC PANEL WITH GFR
Anion gap: 8 (ref 5–15)
BUN: 21 mg/dL (ref 8–23)
CO2: 24 mmol/L (ref 22–32)
Calcium: 9.5 mg/dL (ref 8.9–10.3)
Chloride: 104 mmol/L (ref 98–111)
Creatinine, Ser: 1.6 mg/dL — ABNORMAL HIGH (ref 0.44–1.00)
GFR calc Af Amer: 36 mL/min — ABNORMAL LOW
GFR calc non Af Amer: 31 mL/min — ABNORMAL LOW
Glucose, Bld: 219 mg/dL — ABNORMAL HIGH (ref 70–99)
Potassium: 4 mmol/L (ref 3.5–5.1)
Sodium: 136 mmol/L (ref 135–145)

## 2018-07-16 LAB — TROPONIN I
Troponin I: <0.03 ng/mL
Troponin I: <0.03 ng/mL (ref <0.03)

## 2018-07-16 LAB — CBC
HCT: 30.2 % — ABNORMAL LOW (ref 36.0–46.0)
Hemoglobin: 9.5 g/dL — ABNORMAL LOW (ref 12.0–15.0)
MCH: 26.6 pg (ref 26.0–34.0)
MCHC: 31.5 g/dL (ref 30.0–36.0)
MCV: 84.6 fL (ref 80.0–100.0)
Platelets: 307 K/uL (ref 150–400)
RBC: 3.57 MIL/uL — ABNORMAL LOW (ref 3.87–5.11)
RDW: 19.4 % — ABNORMAL HIGH (ref 11.5–15.5)
WBC: 3.7 K/uL — ABNORMAL LOW (ref 4.0–10.5)
nRBC: 0 % (ref 0.0–0.2)

## 2018-07-16 LAB — MRSA PCR SCREENING: MRSA by PCR: POSITIVE — AB

## 2018-07-16 LAB — C DIFFICILE QUICK SCREEN W PCR REFLEX
C Diff antigen: POSITIVE — AB
C Diff toxin: NEGATIVE

## 2018-07-16 LAB — ECHOCARDIOGRAM COMPLETE
Height: 64 in
Weight: 3888 oz

## 2018-07-16 MED ORDER — CHLORHEXIDINE GLUCONATE CLOTH 2 % EX PADS
6.0000 | MEDICATED_PAD | Freq: Every day | CUTANEOUS | Status: DC
  Administered 2018-07-17: 6 via TOPICAL

## 2018-07-16 MED ORDER — LEVOFLOXACIN 750 MG PO TABS: 750.0000 mg | ORAL_TABLET | ORAL | 0 refills | Status: DC

## 2018-07-16 MED ORDER — IPRATROPIUM-ALBUTEROL 0.5-2.5 (3) MG/3ML IN SOLN
3.0000 mL | Freq: Four times a day (QID) | RESPIRATORY_TRACT | Status: DC
  Administered 2018-07-17 (×3): 3 mL via RESPIRATORY_TRACT
  Filled 2018-07-16 (×3): qty 3

## 2018-07-16 MED ORDER — VANCOMYCIN 50 MG/ML ORAL SOLUTION
125.0000 mg | Freq: Four times a day (QID) | ORAL | Status: DC
  Administered 2018-07-16 – 2018-07-17 (×5): 125 mg via ORAL
  Filled 2018-07-16 (×7): qty 2.5

## 2018-07-16 MED ORDER — PERFLUTREN LIPID MICROSPHERE
1.0000 mL | INTRAVENOUS | Status: AC | PRN
  Administered 2018-07-16: 12:00:00 2 mL via INTRAVENOUS
  Filled 2018-07-16: qty 10

## 2018-07-16 MED ORDER — METHYLPREDNISOLONE SODIUM SUCC 125 MG IJ SOLR
60.0000 mg | Freq: Every day | INTRAMUSCULAR | Status: DC
  Administered 2018-07-17: 60 mg via INTRAVENOUS
  Filled 2018-07-16: qty 2

## 2018-07-16 NOTE — TOC Initial Note (Signed)
Transition of Care [facility]) - Initial/Assessment Note    Patient Details  Name: Katherine [name] MRN: [id number] Date of Birth: 04/27/1940  [facility] CM/SW Contact:    Elza Rafter, RN Phone Number: 07/16/2018, 2:19 PM  Clinical Narrative:       Patient is from [place].  Spoke with [facility] and they can take her back when medically ready.  Patient states she is current with PCP.  Obtains medications without difficulty.  She states she is not ambulatory.  Will transport back to [facility] when medically ready.     Expected Discharge Plan: [place] Barriers to Discharge: Continued Medical Work up   Patient Goals and CMS Choice Patient states their goals for this hospitalization and ongoing recovery are:: Go back to [place] Medicare.gov Compare Post Acute Care list provided to:: Patient Choice offered to / list presented to : Patient  Expected Discharge Plan and Services Expected Discharge Plan: [place]   Discharge Planning Services: CM Consult Post Acute Care Choice: Home Health                             HH Arranged: RN, PT, OT, Nurse's Aide          Prior Living Arrangements/Services     Patient language and need for interpreter reviewed:: Yes Do you feel safe going back to the place where you live?: Yes            Criminal Activity/Legal Involvement Pertinent to Current Situation/Hospitalization: No - Comment as needed  Activities of Daily Living Home Assistive Devices/Equipment: Wheelchair ADL Screening (condition at time of admission) Patient's cognitive ability adequate to safely complete daily activities?: Yes Is the patient deaf or have difficulty hearing?: No Does the patient have difficulty seeing, even when wearing glasses/contacts?: No Does the patient have difficulty concentrating, remembering, or making decisions?: No Patient able to express need for  assistance with ADLs?: Yes Does the patient have difficulty dressing or bathing?: Yes Independently performs ADLs?: No Dressing (OT): Needs assistance Is this a change from baseline?: Pre-admission baseline Bathing: Needs assistance Is this a change from baseline?: Pre-admission baseline Toileting: Needs assistance Is this a change from baseline?: Pre-admission baseline In/Out Bed: Dependent Is this a change from baseline?: Pre-admission baseline Walks in Home: Dependent Is this a change from baseline?: Pre-admission baseline Does the patient have difficulty walking or climbing stairs?: Yes Weakness of Legs: Both Weakness of Arms/Hands: None  Permission Sought/Granted Permission sought to share information with : Facility Art therapist granted to share information with : Yes, Verbal Permission Granted     Permission granted to share info w AGENCY: SNF and Home health agencies        Emotional Assessment Appearance:: Appears stated age Attitude/Demeanor/Rapport: Gracious Affect (typically observed): Accepting Orientation: : Oriented to Self, Oriented to Place Alcohol / Substance Use: Not Applicable    Admission diagnosis:  Atypical chest pain [R07.89] COPD exacerbation (HCC) [J44.1] Chest pain [R07.9] Patient Active Problem List   Diagnosis Date Noted  . Chest pain 07/15/2018  . Palliative care encounter 05/10/2018  . Localized edema 05/10/2018  . Shortness of breath 05/10/2018  . Hematemesis 04/28/2018  . Influenza A 04/13/2018  . Acute on chronic congestive heart failure ([place])   . COPD with acute exacerbation ([place]) 02/24/2018  . Chronic diastolic heart failure ([place]) 12/31/2017  . Lymphedema 12/31/2017  . COPD  exacerbation ([place]) 11/30/2017  . Urinary tract infection 11/22/2017  . 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[place]) 11/16/2017  . Diabetes mellitus type 2, uncomplicated ([place]) 63/84/5364  . Pressure injury of skin 10/29/2017  . Acute  kidney injury superimposed on CKD ([place]) 10/24/2017  . Severe sepsis ([place]) 10/24/2017  . Possible Seizures ([place]) 10/08/2017  . Hyperlipemia 10/08/2017  . 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 Stroke-like episode ([place]) s/p IV tpa 10/04/2017  . Palliative care by specialist   . Elevated rheumatoid factor 09/05/2017  . Frequent hospital admissions 08/22/2017  . Aphasia 06/28/2017  . Hand pain 03/21/2017  . Dry skin 03/21/2017  . Pain in finger of left hand 09/12/2016  . Chronic fatigue 06/12/2016  . Left knee pain 06/12/2016  . Goals of care, counseling/discussion 03/13/2016  . Primary localized osteoarthritis of right knee 02/08/2016  . OSA (obstructive sleep apnea) 09/16/2015  . Rotator cuff syndrome 09/07/2015  . 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 Sleep disturbance 04/14/2015  . Coronary artery disease 03/14/2015  . 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 Acute respiratory failure with hypoxia ([place]) 09/16/2014  . 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 Anxiety   . Tobacco abuse   . Prurigo nodularis   . GERD (gastroesophageal reflux disease)   . COPD (chronic obstructive pulmonary disease) ([place])   . Osteoarthritis   . Vitamin D deficiency disease   . Chronic constipation   . CKD (chronic kidney disease), stage III ([place])   . Essential hypertension 05/20/2013  . Morbid obesity ([place]) 05/20/2013   PCP:  Valerie Roys, DO Pharmacy:   [facility] [phone] [place] [address] Phone: [phone] Fax: [phone]     Social Determinants of Health (SDOH) Interventions    Readmission Risk Interventions Readmission Risk Prevention Plan 07/16/2018 04/29/2018 04/29/2018  Transportation Screening Complete Complete Complete  Medication Review Press photographer) Complete Complete Complete  PCP or Specialist appointment within 3-5 days of discharge Complete Not Complete -   PCP/Specialist Appt Not Complete comments - Patient to return to SNF, and they will make appointments  -  [place] or Home Care Consult Complete (No Data) -  SW Recovery Care/Counseling Consult Not Complete (No Data) -  SW Consult Not Complete Comments na - -  Palliative Care Screening Not Applicable Complete -  Medication Reconcilation ([place] Complete Complete -  Some recent data might be hidden

## 2018-07-16 NOTE — Progress Notes (Addendum)
[facility] NAME: Katherine [name]    MR#:  [id number]  DATE OF BIRTH:  12/04/1940  SUBJECTIVE:  CHIEF COMPLAINT:   Chief Complaint  Patient presents with   Chest Pain    REVIEW OF SYSTEMS:  Review of Systems  Constitutional: Negative for chills, fever and malaise/fatigue.  HENT: Negative for congestion, ear discharge, hearing loss and nosebleeds.   Respiratory: Negative for cough, shortness of breath and wheezing.   Cardiovascular: Negative for chest pain, palpitations and leg swelling.  Gastrointestinal: Positive for diarrhea. Negative for abdominal pain, constipation, nausea and vomiting.  Genitourinary: Negative for dysuria.  Musculoskeletal: Negative for myalgias.  Neurological: Negative for dizziness, seizures, weakness and headaches.  Psychiatric/Behavioral: Negative for depression.    DRUG ALLERGIES:   Allergies  Allergen Reactions   Bee Venom Swelling   Enalapril Maleate Swelling    VITALS:  Blood pressure (!) 120/93, pulse (!) 105, temperature 97.9 F (36.6 C), temperature source Oral, resp. rate 18, height 5\' 4"  (1.626 m), weight 110.2 kg, SpO2 96 %.  PHYSICAL EXAMINATION:  Physical Exam  GENERAL:  78 y.o.-year-old patient lying in the bed with no acute distress.  EYES: Pupils equal, round, reactive to light and accommodation. No scleral icterus. Extraocular muscles intact.  HEENT: Head atraumatic, normocephalic. Oropharynx and nasopharynx clear.  NECK:  Supple, no jugular venous distention. No thyroid enlargement, no tenderness.  LUNGS: Normal breath sounds bilaterally, no wheezing, rales,rhonchi or crepitation. No use of accessory muscles of respiration.  CARDIOVASCULAR: S1, S2 normal. No murmurs, rubs, or gallops.  No chest wall tenderness. ABDOMEN: Soft, nontender, nondistended. Bowel sounds present. No organomegaly or mass.  EXTREMITIES: No  cyanosis, or clubbing.  1+ bilateral pedal edema  noted NEUROLOGIC: Cranial nerves II through XII are intact. Muscle strength equal in both upper  extremities.  Weaker in lower extremities.  Bedbound at baseline.  Sensation intact. Gait not checked.  PSYCHIATRIC: The patient is alert and oriented x 3.  SKIN: No obvious rash, lesion, or ulcer.    LABORATORY PANEL:   CBC Recent Labs  Lab 07/16/18 0601  WBC 3.7*  HGB 9.5*  HCT 30.2*  PLT 307   ------------------------------------------------------------------------------------------------------------------  Chemistries  Recent Labs  Lab 07/15/18 0926 07/16/18 0601  NA 138 136  K 4.3 4.0  CL 105 104  CO2 25 24  GLUCOSE 95 219*  BUN 15 21  CREATININE 1.67* 1.60*  CALCIUM 10.1 9.5  AST 31  --   ALT 19  --   ALKPHOS 145*  --   BILITOT 0.7  --    ------------------------------------------------------------------------------------------------------------------  Cardiac Enzymes Recent Labs  Lab 07/16/18 0602  TROPONINI <0.03   ------------------------------------------------------------------------------------------------------------------  RADIOLOGY:  Ct Chest Wo Contrast  Result Date: 07/15/2018 CLINICAL DATA:  Chest pain EXAM: CT CHEST WITHOUT CONTRAST TECHNIQUE: Multidetector CT imaging of the chest was performed following the standard protocol without IV contrast. COMPARISON:  Plain film from earlier in the same day. FINDINGS: Cardiovascular: Atherosclerotic calcifications of the thoracic aorta are noted without aneurysmal dilatation. Coronary calcifications are seen. Very minimal pericardial effusion is noted. Mediastinum/Nodes: Thoracic inlet is within normal limits. No hilar or mediastinal adenopathy is noted. Lungs/Pleura: Small bilateral pleural effusions are noted. Mild bibasilar atelectasis is noted similar to that seen on prior plain film examination. No sizable parenchymal nodules are seen. Upper Abdomen: Changes consistent with prior cholecystectomy.  Musculoskeletal: Degenerative changes of the thoracic spine are noted. IMPRESSION: Small bilateral pleural effusions and  mild bibasilar atelectasis. Aortic Atherosclerosis (ICD10-I70.0). Electronically Signed   By: [name] Catalina M.D.   On: 07/15/2018 11:13   Nm Pulmonary Perf And Vent  Result Date: 07/15/2018 CLINICAL DATA:  Shortness of breath EXAM: NUCLEAR MEDICINE VENTILATION - PERFUSION LUNG SCAN VIEWS: Anterior, posterior, RAO, LAO, RPO, LPO-ventilation and perfusion RADIOPHARMACEUTICALS:  31.08 mCi of Tc-5m DTPA aerosol inhalation and 4.12 mCi Tc42m MAA IV COMPARISON:  Chest radiograph and chest CT July 15, 2018 FINDINGS: Ventilation: There is essentially homogeneous and symmetric uptake of radiotracer bilaterally. No focal ventilation defects are evident. Perfusion: Radiotracer uptake is essentially homogeneous and symmetric bilaterally. No appreciable perfusion defects evident. IMPRESSION: No appreciable ventilation or perfusion defects. Very low probability of pulmonary embolus. Electronically Signed   By: Lowella Grip III M.D.   On: 07/15/2018 16:27   US Venous Img Lower Bilateral  Result Date: 07/15/2018 CLINICAL DATA:  78 year old with leg pain and swelling. EXAM: BILATERAL LOWER EXTREMITY VENOUS DOPPLER ULTRASOUND TECHNIQUE: Gray-scale sonography with graded compression, as well as color Doppler and duplex ultrasound were performed to evaluate the lower extremity deep venous systems from the level of the common femoral vein and including the common femoral, femoral, profunda femoral, popliteal and calf veins including the posterior tibial, peroneal and gastrocnemius veins when visible. The superficial great saphenous vein was also interrogated. Spectral Doppler was utilized to evaluate flow at rest and with distal augmentation maneuvers in the common femoral, femoral and popliteal veins. COMPARISON:  11/21/2017 FINDINGS: RIGHT LOWER EXTREMITY Common Femoral Vein: No evidence of thrombus.  Normal compressibility, respiratory phasicity and response to augmentation. Saphenofemoral Junction: No evidence of thrombus. Normal compressibility and flow on color Doppler imaging. Profunda Femoral Vein: No evidence of thrombus. Normal compressibility and flow on color Doppler imaging. Femoral Vein: No evidence of thrombus. Normal compressibility, respiratory phasicity and response to augmentation. Popliteal Vein: No evidence of thrombus. Normal compressibility, respiratory phasicity and response to augmentation. Calf Veins: No evidence of thrombus. Normal compressibility and flow on color Doppler imaging. Other Findings:  None. LEFT LOWER EXTREMITY Common Femoral Vein: No evidence of thrombus. Normal compressibility, respiratory phasicity and response to augmentation. Saphenofemoral Junction: No evidence of thrombus. Normal compressibility and flow on color Doppler imaging. Profunda Femoral Vein: No evidence of thrombus. Normal compressibility and flow on color Doppler imaging. Femoral Vein: No evidence of thrombus. Normal compressibility, respiratory phasicity and response to augmentation. Popliteal Vein: No evidence of thrombus. Normal compressibility, respiratory phasicity and response to augmentation. Calf Veins: No evidence of thrombus. Normal compressibility and flow on color Doppler imaging. Other Findings:  None. IMPRESSION: No evidence of deep venous thrombosis in either lower extremity. Electronically Signed   By: Markus Daft M.D.   On: 07/15/2018 17:14   Dg Chest Port 1 View  Result Date: 07/15/2018 CLINICAL DATA:  Chest pain EXAM: PORTABLE CHEST 1 VIEW COMPARISON:  06/11/2018 FINDINGS: Cardiac shadow is within normal limits. Mild atelectasis is noted in the bases bilaterally stable from the prior exam. No new focal infiltrate or effusion is seen. No acute bony abnormality is noted. Aortic calcifications are seen. IMPRESSION: Stable bibasilar atelectasis. Electronically Signed   By: [name] Catalina  M.D.   On: 07/15/2018 09:32    EKG:   Orders placed or performed during the hospital encounter of 07/15/18   ED EKG   ED EKG   EKG 12-Lead   EKG 12-Lead    ASSESSMENT AND PLAN:   Takerra Lupinacci  is a 78 y.o. female with a known history of  arthritis who is wheelchair-bound at baseline, seizure disorder, hypertension, diabetes, COPD, CKD stage III, diastolic CHF from [facility] brought in for chest pain.  1.  Chest pain-atypical, could be chest tightness from COPD -Monitored on telemetry -Troponins x3 negative. -Continue nebs, started on Solu-Medrol.  Change to oral prednisone tomorrow -CT chest without contrast showing bibasilar atelectasis.  No pneumonia identified -D-dimer elevated, dopplers negative for DVT and VQ scan with low probability -Patient was restarted on Levaquin for a possible pneumonia as outpatient.  Will continue.  2.  History of seizure disorder-continue Keppra.  No seizures.  3.  CKD stage III-seems to be at baseline.  Monitor  4.  Diabetes mellitus-on linagliptin.  Add sliding scale insulin  5.  Hypertension-continue home medications.  6.  DVT prophylaxis-Lovenox  7. New onset diarrhea- c.diff Ag positive but toxin negative - but patient is symptomatic, so will start oral vancomycin  Patient is wheelchair-bound at baseline.     All the records are reviewed and case discussed with Care Management/Social Workerr. Management plans discussed with the patient, family and they are in agreement.  CODE STATUS: Full Code  TOTAL TIME TAKING CARE OF THIS PATIENT: 38 minutes.   POSSIBLE D/C TOMORROW, DEPENDING ON CLINICAL CONDITION.   Gladstone Lighter M.D on 07/16/2018 at 1:24 PM  Between 7am to 6pm - Pager - [phone]  After 6pm go to [email or web address] - password [email or web address] [facility]  Office  [phone]  CC: Primary care physician; Valerie Roys, DO

## 2018-07-16 NOTE — Progress Notes (Signed)
*  PRELIMINARY RESULTS* Echocardiogram 2D Echocardiogram has been performed.  Katherine [name] 07/16/2018, 11:32 AM

## 2018-07-16 NOTE — Care Management Obs Status (Signed)
[facility] NOTIFICATION   Patient Details  Name: Katherine [name] MRN: [id number] Date of Birth: [birth year]   Medicare Observation Status Notification Given:  Yes    Elza Rafter, RN 07/16/2018, 2:14 PM

## 2018-07-16 NOTE — Plan of Care (Signed)
  Problem: Education: Goal: Knowledge of General Education information will improve Description Including pain rating scale, medication(s)/side effects and non-pharmacologic comfort measures Outcome: Progressing   Problem: Health Behavior/Discharge Planning: Goal: Ability to manage health-related needs will improve Outcome: Progressing   Problem: Clinical Measurements: Goal: Will remain free from infection Outcome: Progressing Goal: Respiratory complications will improve Outcome: Progressing   Problem: Nutrition: Goal: Adequate nutrition will be maintained Outcome: Progressing   Problem: Elimination: Goal: Will not experience complications related to urinary retention Outcome: Progressing   Problem: Safety: Goal: Ability to remain free from injury will improve Outcome: Progressing

## 2018-07-17 DIAGNOSIS — R079 Chest pain, unspecified: Secondary | ICD-10-CM | POA: Diagnosis not present

## 2018-07-17 DIAGNOSIS — M1990 Unspecified osteoarthritis, unspecified site: Secondary | ICD-10-CM | POA: Diagnosis not present

## 2018-07-17 DIAGNOSIS — Z7401 Bed confinement status: Secondary | ICD-10-CM | POA: Diagnosis not present

## 2018-07-17 DIAGNOSIS — M2550 Pain in unspecified joint: Secondary | ICD-10-CM | POA: Diagnosis not present

## 2018-07-17 DIAGNOSIS — J441 Chronic obstructive pulmonary disease with (acute) exacerbation: Secondary | ICD-10-CM | POA: Diagnosis not present

## 2018-07-17 DIAGNOSIS — R5381 Other malaise: Secondary | ICD-10-CM | POA: Diagnosis not present

## 2018-07-17 DIAGNOSIS — A498 Other bacterial infections of unspecified site: Secondary | ICD-10-CM | POA: Diagnosis not present

## 2018-07-17 DIAGNOSIS — N183 Chronic kidney disease, stage 3 (moderate): Secondary | ICD-10-CM | POA: Diagnosis not present

## 2018-07-17 LAB — BASIC METABOLIC PANEL
Anion gap: 8 (ref 5–15)
BUN: 25 mg/dL — ABNORMAL HIGH (ref 8–23)
CO2: 25 mmol/L (ref 22–32)
Calcium: 9.2 mg/dL (ref 8.9–10.3)
Chloride: 106 mmol/L (ref 98–111)
Creatinine, Ser: 1.76 mg/dL — ABNORMAL HIGH (ref 0.44–1.00)
GFR calc Af Amer: 32 mL/min — ABNORMAL LOW (ref >60)
GFR calc non Af Amer: 28 mL/min — ABNORMAL LOW (ref >60)
Glucose, Bld: 128 mg/dL — ABNORMAL HIGH (ref 70–99)
Potassium: 3.7 mmol/L (ref 3.5–5.1)
Sodium: 139 mmol/L (ref 135–145)

## 2018-07-17 LAB — GLUCOSE, CAPILLARY
Glucose-Capillary: 79 mg/dL (ref 70–99)
Glucose-Capillary: 108 mg/dL — ABNORMAL HIGH (ref 70–99)
Glucose-Capillary: 189 mg/dL — ABNORMAL HIGH (ref 70–99)

## 2018-07-17 MED ORDER — VANCOMYCIN 50 MG/ML ORAL SOLUTION: 125.0000 mg | Freq: Four times a day (QID) | ORAL | 0 refills | Status: AC

## 2018-07-17 MED ORDER — LEVOFLOXACIN 750 MG PO TABS: 750.0000 mg | ORAL_TABLET | ORAL | 0 refills | Status: DC

## 2018-07-17 MED ORDER — PREDNISONE 20 MG PO TABS: 40.0000 mg | ORAL_TABLET | Freq: Every day | ORAL | 0 refills | Status: AC

## 2018-07-17 MED ORDER — IPRATROPIUM-ALBUTEROL 0.5-2.5 (3) MG/3ML IN SOLN: 3.0000 mL | Freq: Three times a day (TID) | RESPIRATORY_TRACT | 0 refills | Status: DC

## 2018-07-17 NOTE — NC FL2 (Signed)
[place] LEVEL OF CARE SCREENING TOOL     IDENTIFICATION  Patient Name: Katherine [name] Birthdate: [birth year] Sex: female Admission Date (Current Location): 07/15/2018  [place] and [place] Number:  Katherine [name] [phone] [place] and Address:  [facility], [address], [place], [place] [address]      Provider Number: [id number]  Attending Physician Name and Address:  Gladstone Lighter, MD  Relative Name and Phone Number:  Katherine [name] [phone]  [phone]     Current Level of Care: Hospital Recommended Level of Care: [place] Prior Approval Number:    Date Approved/Denied:   PASRR Number: [id number]  Discharge Plan: SNF    Current Diagnoses: Patient Active Problem List   Diagnosis Date Noted  . Chest pain 07/15/2018  . Palliative care encounter 05/10/2018  . Localized edema 05/10/2018  . Shortness of breath 05/10/2018  . Hematemesis 04/28/2018  . Influenza A 04/13/2018  . Acute on chronic congestive heart failure ([place])   . COPD with acute exacerbation ([place]) 02/24/2018  . Chronic diastolic heart failure ([place]) 12/31/2017  . Lymphedema 12/31/2017  . COPD exacerbation ([place]) 11/30/2017  . Urinary tract infection 11/22/2017  . 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[place]) 11/16/2017  . Diabetes mellitus type 2, uncomplicated ([place]) 67/61/9509  . Pressure injury of skin 10/29/2017  . Acute kidney injury superimposed on CKD ([place]) 10/24/2017  . Severe sepsis ([place]) 10/24/2017  . Possible Seizures ([place]) 10/08/2017  . Hyperlipemia 10/08/2017  . 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 Stroke-like episode ([place]) s/p IV tpa 10/04/2017  . Palliative care by specialist   . Elevated rheumatoid factor 09/05/2017  . Frequent hospital admissions 08/22/2017  . Aphasia 06/28/2017  . Hand pain 03/21/2017  . Dry skin 03/21/2017  . Pain in finger of left hand 09/12/2016  . Chronic fatigue 06/12/2016   . Left knee pain 06/12/2016  . Goals of care, counseling/discussion 03/13/2016  . Primary localized osteoarthritis of right knee 02/08/2016  . OSA (obstructive sleep apnea) 09/16/2015  . Rotator cuff syndrome 09/07/2015  . 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 Sleep disturbance 04/14/2015  . Coronary artery disease 03/14/2015  . 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 Acute respiratory failure with hypoxia ([place]) 09/16/2014  . 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 Anxiety   . Tobacco abuse   . Prurigo nodularis   . GERD (gastroesophageal reflux disease)   . COPD (chronic obstructive pulmonary disease) ([place])   . Osteoarthritis   . Vitamin D deficiency disease   . Chronic constipation   . CKD (chronic kidney disease), stage III ([place])   . Essential hypertension 05/20/2013  . Morbid obesity ([place]) 05/20/2013    Orientation RESPIRATION BLADDER Height & Weight     Self, Time, Situation, Place  O2(2L) Continent Weight: 243 lb (110.2 kg) Height:  5\' 4"  (162.6 cm)  BEHAVIORAL SYMPTOMS/MOOD NEUROLOGICAL BOWEL NUTRITION STATUS      Continent Diet(Cardiac)  AMBULATORY STATUS COMMUNICATION OF NEEDS Skin   Limited Assist Verbally Normal                       Personal Care Assistance Level of Assistance  Bathing, Feeding, Dressing Bathing Assistance: Limited assistance Feeding assistance: Independent Dressing Assistance: Limited assistance     Functional Limitations Info  Sight, Hearing, Speech Sight Info: Adequate Hearing Info: Adequate Speech Info: Adequate    SPECIAL CARE FACTORS FREQUENCY  PT (By licensed PT)     PT Frequency: Minimum 2x a week  Contractures Contractures Info: Not present    Additional Factors Info  Code Status, Allergies, Psychotropic, Insulin Sliding Scale, Isolation Precautions Code Status Info: Full Code Allergies Info: BEE VENOM, ENALAPRIL MALEATE  Psychotropic Info: FLUoxetine (PROZAC) capsule 40 mg  Insulin Sliding Scale Info:  insulin aspart (novoLOG) injection 0-9 Units 3x a day with meals. Isolation Precautions Info: History of VRE, and MRSA     Current Medications (07/17/2018):  This is the current hospital active medication list Current Facility-Administered Medications  Medication Dose Route Frequency Provider Last Rate Last Dose  . acetaminophen (TYLENOL) tablet 650 mg  650 mg Oral Q6H PRN Gladstone Lighter, MD   650 mg at 07/17/18 0950   Or  . acetaminophen (TYLENOL) suppository 650 mg  650 mg Rectal Q6H PRN Gladstone Lighter, MD      . atorvastatin (LIPITOR) tablet 80 mg  80 mg Oral QPM Gladstone Lighter, MD   80 mg at 07/16/18 1745  . Chlorhexidine Gluconate Cloth 2 % PADS 6 each  6 each Topical Q0600 Gladstone Lighter, MD   6 each at 07/17/18 [phone]  . cholecalciferol (VITAMIN D) tablet 1,000 Units  1,000 Units Oral Daily Gladstone Lighter, MD   1,000 Units at 07/17/18 0950  . clopidogrel (PLAVIX) tablet 75 mg  75 mg Oral Daily Gladstone Lighter, MD   75 mg at 07/17/18 0951  . colchicine tablet 0.6 mg  0.6 mg Oral Daily PRN Gladstone Lighter, MD      . diclofenac sodium (VOLTAREN) 1 % transdermal gel 2 g  2 g Topical Q12H PRN Gladstone Lighter, MD      . docusate sodium (COLACE) capsule 100 mg  100 mg Oral BID Gladstone Lighter, MD   Stopped at 07/15/18 2043  . enoxaparin (LOVENOX) injection 40 mg  40 mg Subcutaneous Q12H Gladstone Lighter, MD   40 mg at 07/17/18 0950  . FLUoxetine (PROZAC) capsule 40 mg  40 mg Oral Daily Gladstone Lighter, MD   40 mg at 07/17/18 0950  . fluticasone furoate-vilanterol (BREO ELLIPTA) 100-25 MCG/INH 1 puff  1 puff Inhalation Daily Gladstone Lighter, MD   1 puff at 07/17/18 0949   And  . umeclidinium bromide (INCRUSE ELLIPTA) 62.5 MCG/INH 1 puff  1 puff Inhalation Daily Gladstone Lighter, MD   1 puff at 07/17/18 0948  . gabapentin (NEURONTIN) capsule 300 mg  300 mg Oral QHS Gladstone Lighter, MD   300 mg at 07/16/18 2135  . hydrALAZINE (APRESOLINE) tablet 25 mg  25  mg Oral TID Gladstone Lighter, MD   25 mg at 07/16/18 2135  . insulin aspart (novoLOG) injection 0-5 Units  0-5 Units Subcutaneous QHS Gladstone Lighter, MD   3 Units at 07/15/18 2214  . insulin aspart (novoLOG) injection 0-9 Units  0-9 Units Subcutaneous TID WC Gladstone Lighter, MD   1 Units at 07/16/18 1746  . ipratropium-albuterol (DUONEB) 0.5-2.5 (3) MG/3ML nebulizer solution 3 mL  3 mL Nebulization Q6H Gladstone Lighter, MD   3 mL at 07/17/18 0748  . levETIRAcetam (KEPPRA XR) 24 hr tablet 500 mg  500 mg Oral Daily Gladstone Lighter, MD   500 mg at 07/17/18 0949  . levofloxacin (LEVAQUIN) tablet 750 mg  750 mg Oral Q48H Gladstone Lighter, MD   750 mg at 07/16/18 0918  . linagliptin (TRADJENTA) tablet 5 mg  5 mg Oral Daily Gladstone Lighter, MD   5 mg at 07/17/18 0951  . loratadine (CLARITIN) tablet 10 mg  10 mg Oral Daily Gladstone Lighter, MD   10 mg  at 07/17/18 0952  . meclizine (ANTIVERT) tablet 25 mg  25 mg Oral Q12H PRN Gladstone Lighter, MD      . methylPREDNISolone sodium succinate (SOLU-MEDROL) 125 mg/2 mL injection 60 mg  60 mg Intravenous Daily Gladstone Lighter, MD   60 mg at 07/17/18 0951  . multivitamin with minerals tablet 1 tablet  1 tablet Oral Daily Gladstone Lighter, MD   1 tablet at 07/17/18 0951  . mupirocin ointment (BACTROBAN) 2 % 1 application  1 application Nasal BID Gladstone Lighter, MD   1 application at 50/15/86 0951  . nystatin (MYCOSTATIN) 100000 UNIT/ML suspension 500,000 Units  5 mL Oral QID Gladstone Lighter, MD   500,000 Units at 07/16/18 2135  . ondansetron (ZOFRAN) tablet 4 mg  4 mg Oral Q6H PRN Gladstone Lighter, MD       Or  . ondansetron (ZOFRAN) injection 4 mg  4 mg Intravenous Q6H PRN Gladstone Lighter, MD      . pantoprazole (PROTONIX) EC tablet 40 mg  40 mg Oral Daily Gladstone Lighter, MD   40 mg at 07/17/18 0951  . vancomycin (VANCOCIN) 50 mg/mL oral solution 125 mg  125 mg Oral Q6H Gladstone Lighter, MD   125 mg at 07/17/18 0656  .  vitamin B-12 (CYANOCOBALAMIN) tablet 1,000 mcg  1,000 mcg Oral Daily Gladstone Lighter, MD   1,000 mcg at 07/17/18 8257     Discharge Medications: Please see discharge summary for a list of discharge medications.  Relevant Imaging Results:  Relevant Lab Results:   Additional Information [id number]  Ross Ludwig, LCSW

## 2018-07-17 NOTE — Plan of Care (Signed)
  Problem: Clinical Measurements: Goal: Ability to maintain clinical measurements within normal limits will improve Outcome: Progressing Goal: Will remain free from infection Outcome: Progressing Goal: Respiratory complications will improve Outcome: Progressing   Problem: Nutrition: Goal: Adequate nutrition will be maintained Outcome: Progressing   Problem: Elimination: Goal: Will not experience complications related to urinary retention Outcome: Progressing   Problem: Pain Managment: Goal: General experience of comfort will improve Outcome: Progressing

## 2018-07-17 NOTE — Discharge Summary (Addendum)
[facility] [place] NAME: Katherine [name]    MR#:  [id number]  DATE OF BIRTH:  March 02, 1940  DATE OF ADMISSION:  07/15/2018   ADMITTING PHYSICIAN: Gladstone Lighter, MD  DATE OF DISCHARGE:  07/17/18  PRIMARY CARE PHYSICIAN: Valerie Roys, DO   ADMISSION DIAGNOSIS:   Atypical chest pain [R07.89] COPD exacerbation (HCC) [J44.1] Chest pain [R07.9]  DISCHARGE DIAGNOSIS:   Active Problems:   Chest pain   SECONDARY DIAGNOSIS:   Past Medical History:  Diagnosis Date  . Angioedema   . Anxiety and depression   . CHF (congestive heart failure) ([place])   . Chronic constipation   . Chronic kidney disease    stage 3  . COPD (chronic obstructive pulmonary disease) ([place])   . Diabetes mellitus without complication ([place])   . Gallstones   . GERD (gastroesophageal reflux disease)   . Hypertension   . Left ventricular hypertrophy   . Osteoarthritis   . Prurigo nodularis   . Seizures ([place])   . Stroke ([place])   . Tobacco abuse   . Vitamin D deficiency disease     HOSPITAL COURSE:   CaroleneJonesis a77 y.o.femalewith a known history of arthritis who is wheelchair-bound at baseline, seizure disorder, hypertension, diabetes, COPD, CKD stage III, diastolic CHF from [facility] brought in for chest pain.  1. Chest pain-atypical, could be chest tightness from COPD -Monitored on telemetry -Troponins x3 negative. -Continue nebs, on Solu-Medrol with improvement-change to prednisone at discharge. -CT chest without contrast showing bibasilar atelectasis. No pneumonia identified -D-dimer elevated, dopplers negative for DVT and VQ scan with low probability -Patient was  on Levaquin for a possible pneumonia as outpatient.  Will continue and finish off the course.  2. History of seizure disorder-continue Keppra. No active seizures.  3. CKD stage III-seems to be at baseline. Monitor  4. Diabetes mellitus-on  linagliptin.   5. Hypertension-continue home medications  6. New onset diarrhea- c.diff Ag positive but toxin negative - but patient is symptomatic, so started on oral vancomycin  Patient is wheelchair-bound at baseline Discharge to [facility] today  DISCHARGE CONDITIONS:   Guarded  CONSULTS OBTAINED:   None  DRUG ALLERGIES:   Allergies  Allergen Reactions  . Bee Venom Swelling  . Enalapril Maleate Swelling   DISCHARGE MEDICATIONS:   Allergies as of 07/17/2018      Reactions   Bee Venom Swelling   Enalapril Maleate Swelling      Medication List    STOP taking these medications   amoxicillin-clavulanate 875-125 MG tablet Commonly known as:  AUGMENTIN   levofloxacin 750 MG tablet Commonly known as:  LEVAQUIN     TAKE these medications   acetaminophen 650 MG CR tablet Commonly known as:  TYLENOL Take 650 mg by mouth 3 (three) times daily.   albuterol 108 (90 Base) MCG/ACT inhaler Commonly known as:  VENTOLIN HFA Inhale 2 puffs into the lungs every 4 (four) hours as needed for wheezing or shortness of breath.   albuterol 0.63 MG/3ML nebulizer solution Commonly known as:  ACCUNEB Take 3 mLs by nebulization every 8 (eight) hours as needed for wheezing.   atorvastatin 80 MG tablet Commonly known as:  LIPITOR Take 80 mg by mouth every evening.   cetirizine 10 MG tablet Commonly known as:  ZYRTEC Take 5 mg by mouth at bedtime.   cholecalciferol 1000 units tablet Commonly known as:  VITAMIN D Take 1,000 Units by mouth  daily.   clopidogrel 75 MG tablet Commonly known as:  PLAVIX Take 75 mg by mouth daily.   colchicine 0.6 MG tablet Take 1 tablet (0.6 mg total) by mouth daily.   cyanocobalamin 1000 MCG tablet Take 1 tablet (1,000 mcg total) by mouth daily.   diclofenac sodium 1 % Gel Commonly known as:  VOLTAREN Apply 2 g topically every 12 (twelve) hours as needed (for left knee pain).   docusate sodium 100 MG capsule  Commonly known as:  COLACE Take 100 mg by mouth 2 (two) times daily.   FLUoxetine 40 MG capsule Commonly known as:  PROZAC Take 40 mg by mouth daily.   Fluticasone-Umeclidin-Vilant 100-62.5-25 MCG/INH Aepb Inhale 1 puff into the lungs daily.   gabapentin 300 MG capsule Commonly known as:  NEURONTIN Take 300 mg by mouth at bedtime.   hydrALAZINE 25 MG tablet Commonly known as:  APRESOLINE Take 25 mg by mouth 3 (three) times daily.   ipratropium 0.03 % nasal spray Commonly known as:  ATROVENT Place 2 sprays into both nostrils 2 (two) times daily.   ipratropium-albuterol 0.5-2.5 (3) MG/3ML Soln Commonly known as:  DUONEB Take 3 mLs by nebulization 3 (three) times daily. What changed:  when to take this   levETIRAcetam 500 MG 24 hr tablet Commonly known as:  KEPPRA XR Take 1 tablet (500 mg total) by mouth daily.   linagliptin 5 MG Tabs tablet Commonly known as:  TRADJENTA Take 5 mg by mouth daily.   LORazepam 2 MG/ML injection Commonly known as:  ATIVAN Inject 0.5 mLs (1 mg total) into the muscle daily as needed for seizure.   meclizine 25 MG tablet Commonly known as:  ANTIVERT Take 25 mg by mouth every 12 (twelve) hours as needed for dizziness.   multivitamin with minerals Tabs tablet Take 1 tablet by mouth daily.   mupirocin ointment 2 % Commonly known as:  BACTROBAN Place 1 application into the nose 2 (two) times daily.   nystatin 100000 UNIT/ML suspension Commonly known as:  MYCOSTATIN Take 5 mLs (500,000 Units total) by mouth 4 (four) times daily.   pantoprazole 40 MG tablet Commonly known as:  PROTONIX Take 1 tablet (40 mg total) by mouth daily.   predniSONE 20 MG tablet Commonly known as:  Deltasone Take 2 tablets (40 mg total) by mouth daily for 5 days. What changed:    medication strength  how much to take  how to take this  when to take this  additional instructions   vancomycin 50 mg/mL  oral solution Commonly known as:  VANCOCIN Take  2.5 mLs (125 mg total) by mouth every 6 (six) hours for 14 days.        DISCHARGE INSTRUCTIONS:   1.  PCP follow-up in 1 to 2 weeks  DIET:   Cardiac diet  ACTIVITY:   Activity as tolerated  OXYGEN:   Home Oxygen: Yes.    Oxygen Delivery: 2 liters/min via Patient connected to nasal cannula oxygen  DISCHARGE LOCATION:   nursing home   If you experience worsening of your admission symptoms, develop shortness of breath, life threatening emergency, suicidal or homicidal thoughts you must seek medical attention immediately by calling 911 or calling your MD immediately  if symptoms less severe.  You Must read complete instructions/literature along with all the possible adverse reactions/side effects for all the Medicines you take and that have been prescribed to you. Take any new Medicines after you have completely understood and accpet all the possible  adverse reactions/side effects.   Please note  You were cared for by a hospitalist during your hospital stay. If you have any questions about your discharge medications or the care you received while you were in the hospital after you are discharged, you can call the unit and asked to speak with the hospitalist on call if the hospitalist that took care of you is not available. Once you are discharged, your primary care physician will handle any further medical issues. Please note that NO REFILLS for any discharge medications will be authorized once you are discharged, as it is imperative that you return to your primary care physician (or establish a relationship with a primary care physician if you do not have one) for your aftercare needs so that they can reassess your need for medications and monitor your lab values.    On the day of Discharge:  VITAL SIGNS:   Blood pressure 95/72, pulse (!) 105, temperature 97.8 F (36.6 C), temperature source Oral, resp. rate 18, height 5\' 4"  (1.626 m), weight 110.2 kg, SpO2 97 %.  PHYSICAL  EXAMINATION:     GENERAL:78 y.o.-year-old patient lying in the bed with no acute distress.  EYES: Pupils equal, round, reactive to light and accommodation. No scleral icterus. Extraocular muscles intact.  HEENT: Head atraumatic, normocephalic. Oropharynx and nasopharynx clear.  NECK: Supple, no jugular venous distention. No thyroid enlargement, no tenderness.  LUNGS: Normal breath sounds bilaterally, no wheezing, rales,rhonchi or crepitation. No use of accessory muscles of respiration.  CARDIOVASCULAR: S1, S2 normal. No murmurs, rubs, or gallops.No chest wall tenderness. ABDOMEN: Soft, nontender, nondistended. Bowel sounds present. No organomegaly or mass.  EXTREMITIES: Nocyanosis, or clubbing.1+ bilateral pedal edema noted NEUROLOGIC: Cranial nerves II through XII are intact. Muscle strengthequal in both upper extremities.Weaker in lower extremities. Bedbound at baseline. Sensation intact. Gait not checked.  PSYCHIATRIC: The patient is alert and oriented x 3.  SKIN: No obvious rash, lesion, or ulcer.   DATA REVIEW:   CBC Recent Labs  Lab 07/16/18 0601  WBC 3.7*  HGB 9.5*  HCT 30.2*  PLT 307    Chemistries  Recent Labs  Lab 07/15/18 0926  07/17/18 0247  NA 138   < > 139  K 4.3   < > 3.7  CL 105   < > 106  CO2 25   < > 25  GLUCOSE 95   < > 128*  BUN 15   < > 25*  CREATININE 1.67*   < > 1.76*  CALCIUM 10.1   < > 9.2  AST 31  --   --   ALT 19  --   --   ALKPHOS 145*  --   --   BILITOT 0.7  --   --    < > = values in this interval not displayed.     Microbiology Results  Results for orders placed or performed during the hospital encounter of 07/15/18  Blood culture (routine x 2)     Status: None (Preliminary result)   Collection Time: 07/15/18  9:13 AM  Result Value Ref Range Status   Specimen Description BLOOD BLOOD RIGHT ARM  Final   Special Requests   Final    BOTTLES DRAWN AEROBIC AND ANAEROBIC Blood Culture adequate volume   Culture   Final     NO GROWTH 2 DAYS Performed at [facility], [address] [place], [place] [address]    Report Status PENDING  Incomplete  SARS Coronavirus 2 (CEPHEID- Performed  in [facility] lab), Hosp Order     Status: None   Collection Time: 07/15/18  9:15 AM  Result Value Ref Range Status   SARS Coronavirus 2 NEGATIVE NEGATIVE Final    Comment: (NOTE) If result is NEGATIVE SARS-CoV-2 target nucleic acids are NOT DETECTED. The SARS-CoV-2 RNA is generally detectable in upper and lower  respiratory specimens during the acute phase of infection. The lowest  concentration of SARS-CoV-2 viral copies this assay can detect is 250  copies / mL. A negative result does not preclude SARS-CoV-2 infection  and should not be used as the sole basis for treatment or other  patient management decisions.  A negative result may occur with  improper specimen collection / handling, submission of specimen other  than nasopharyngeal swab, presence of viral mutation(s) within the  areas targeted by this assay, and inadequate number of viral copies  (<250 copies / mL). A negative result must be combined with clinical  observations, patient history, and epidemiological information. If result is POSITIVE SARS-CoV-2 target nucleic acids are DETECTED. The SARS-CoV-2 RNA is generally detectable in upper and lower  respiratory specimens dur ing the acute phase of infection.  Positive  results are indicative of active infection with SARS-CoV-2.  Clinical  correlation with patient history and other diagnostic information is  necessary to determine patient infection status.  Positive results do  not rule out bacterial infection or co-infection with other viruses. If result is PRESUMPTIVE POSTIVE SARS-CoV-2 nucleic acids MAY BE PRESENT.   A presumptive positive result was obtained on the submitted specimen  and confirmed on repeat testing.  While 2019 novel coronavirus  (SARS-CoV-2) nucleic acids may be  present in the submitted sample  additional confirmatory testing may be necessary for epidemiological  and / or clinical management purposes  to differentiate between  SARS-CoV-2 and other Sarbecovirus currently known to infect humans.  If clinically indicated additional testing with an alternate test  methodology [phone]) is advised. The SARS-CoV-2 RNA is generally  detectable in upper and lower respiratory sp ecimens during the acute  phase of infection. The expected result is Negative. Fact Sheet for Patients:  StrictlyIdeas.no Fact Sheet for Healthcare Providers: BankingDealers.co.za This test is not yet approved or cleared by the [place] FDA and has been authorized for detection and/or diagnosis of SARS-CoV-2 by FDA under an Emergency Use Authorization (EUA).  This EUA will remain in effect (meaning this test can be used) for the duration of the COVID-19 declaration under Section 564(b)(1) of the Act, 21 U.S.C. section 360bbb-3(b)(1), unless the authorization is terminated or revoked sooner. Performed at [facility], [address] [place], [place] [address]   Blood culture (routine x 2)     Status: None (Preliminary result)   Collection Time: 07/15/18  9:34 AM  Result Value Ref Range Status   Specimen Description BLOOD LEFT ANTECUBITAL  Final   Special Requests   Final    BOTTLES DRAWN AEROBIC AND ANAEROBIC Blood Culture adequate volume   Culture   Final    NO GROWTH 2 DAYS Performed at [facility], [address] [place], [place] [address]    Report Status PENDING  Incomplete  C difficile quick scan w PCR reflex     Status: Abnormal   Collection Time: 07/16/18  9:29 AM  Result Value Ref Range Status   C Diff antigen POSITIVE (A) NEGATIVE Final   C Diff toxin NEGATIVE NEGATIVE Final   C Diff interpretation Results are  indeterminate. See PCR results.  Final    Comment: Performed at [facility], [address] [place], [place] [address]  Gastrointestinal Panel by PCR , Stool     Status: None   Collection Time: 07/16/18  9:29 AM  Result Value Ref Range Status   Campylobacter species NOT DETECTED NOT DETECTED Final   Plesimonas shigelloides NOT DETECTED NOT DETECTED Final   Salmonella species NOT DETECTED NOT DETECTED Final   Yersinia enterocolitica NOT DETECTED NOT DETECTED Final   Vibrio species NOT DETECTED NOT DETECTED Final   Vibrio cholerae NOT DETECTED NOT DETECTED Final   Enteroaggregative E coli (EAEC) NOT DETECTED NOT DETECTED Final   Enteropathogenic E coli (EPEC) NOT DETECTED NOT DETECTED Final   Enterotoxigenic E coli (ETEC) NOT DETECTED NOT DETECTED Final   Shiga like toxin producing E coli (STEC) NOT DETECTED NOT DETECTED Final   Shigella/Enteroinvasive E coli (EIEC) NOT DETECTED NOT DETECTED Final   Cryptosporidium NOT DETECTED NOT DETECTED Final   Cyclospora cayetanensis NOT DETECTED NOT DETECTED Final   Entamoeba histolytica NOT DETECTED NOT DETECTED Final   Giardia lamblia NOT DETECTED NOT DETECTED Final   Adenovirus F40/41 NOT DETECTED NOT DETECTED Final   Astrovirus NOT DETECTED NOT DETECTED Final   Norovirus GI/GII NOT DETECTED NOT DETECTED Final   Rotavirus A NOT DETECTED NOT DETECTED Final   Sapovirus (I, II, IV, and V) NOT DETECTED NOT DETECTED Final    Comment: Performed at [facility], [address] [place], [place] [address]  C. Diff by PCR, Reflexed     Status: None   Collection Time: 07/16/18  9:29 AM  Result Value Ref Range Status   Toxigenic C. Difficile by PCR NEGATIVE NEGATIVE Final    Comment: Patient is colonized with non toxigenic C. difficile. May not need treatment unless significant symptoms are present. Performed at [facility], [address] [place], [place] [address]   MRSA PCR Screening     Status: Abnormal   Collection Time: 07/16/18 11:43 AM  Result Value Ref Range Status   MRSA by PCR  POSITIVE (A) NEGATIVE Final    Comment:        The GeneXpert MRSA Assay (FDA approved for NASAL specimens only), is one component of a comprehensive MRSA colonization surveillance program. It is not intended to diagnose MRSA infection nor to guide or monitor treatment for MRSA infections. RESULT CALLED TO, READ BACK BY AND VERIFIED WITH: LINDSAY MARTIN 07/16/18 1348 KLW Performed at [facility], [address] [place], [place] [address]     RADIOLOGY:  No results found.   Management plans discussed with the patient, family and they are in agreement.  CODE STATUS:     Code Status Orders  (From admission, onward)         Start     Ordered   07/15/18 1819  Full code  Continuous     07/15/18 1819        Code Status History    Date Active Date Inactive Code Status Order ID Comments User Context   06/10/2018 2046 06/13/2018 1428 Full Code [id number]  Sela Hua, MD Inpatient   04/28/2018 1053 04/29/2018 2305 Full Code [id number]  Epifanio Lesches, MD ED   04/13/2018 2359 04/15/2018 2116 Full Code [id number]  Arta Silence, MD Inpatient   02/24/2018 2221 02/28/2018 1815 Full Code [id number]  Dustin Flock, MD Inpatient   12/25/2017 1653 12/26/2017 2152 Full Code [id number]  Salary, Avel Peace, MD Inpatient  11/29/2017 2119 12/05/2017 1841 Full Code [id number]  Gorden Harms, MD Inpatient   11/22/2017 0408 11/26/2017 1804 Full Code [id number]  Arta Silence, MD Inpatient   11/10/2017 1840 11/13/2017 1653 Full Code [id number]  Sela Hua, MD Inpatient   10/24/2017 0247 10/29/2017 2301 Full Code [id number]  Lance Coon, MD Inpatient   10/04/2017 1005 10/10/2017 1813 Full Code [id number]  Estill Bakes Inpatient   10/01/2017 0819 10/04/2017 0904 Full Code [id number]  Lolita Cram, RN Inpatient   09/19/2017 1306 09/22/2017 1622 Full Code [id number]  Gorden Harms, MD Inpatient   09/11/2017 2021 09/15/2017 1510 Full Code [id number]  Gorden Harms, MD  Inpatient   08/27/2017 1543 08/29/2017 2159 Full Code [id number]  Hillary Bow, MD ED   06/28/2017 1414 06/30/2017 1748 Full Code [id number]  Gorden Harms, MD Inpatient   06/12/2017 0858 06/16/2017 1659 DNR [id number]  Demetrios Loll, MD Inpatient   05/26/2017 1732 05/30/2017 1446 DNR [id number]  Hillary Bow, MD ED   05/17/2017 1228 05/21/2017 1511 DNR [id number]  Asencion Gowda, NP Inpatient   04/21/2017 0117 04/22/2017 1750 DNR [id number]  Lance Coon, MD Inpatient   04/05/2017 1411 04/08/2017 2101 Full Code [id number]  Saundra Shelling, MD Inpatient   02/08/2016 1414 02/11/2016 1729 Full Code [id number]  Hessie Knows, MD Inpatient   11/27/2015 1645 11/29/2015 1628 Full Code [id number]  Idelle Crouch, MD Inpatient   02/10/2015 2122 02/12/2015 1809 Full Code [id number]  Lytle Butte, MD ED   09/16/2014 1220 09/19/2014 1736 Full Code [id number]  Aldean Jewett, MD Inpatient      TOTAL TIME TAKING CARE OF THIS PATIENT: 38 minutes.    Gladstone Lighter M.D on 07/17/2018 at 11:15 AM  Between 7am to 6pm - Pager - [phone]  After 6pm go to [email or web address] - Proofreader  [employer] [place] [facility]  Office  [phone]  CC: Primary care physician; Valerie Roys, DO   Note: This dictation was prepared with Dragon dictation along with smaller phrase technology. Any transcriptional errors that result from this process are unintentional.

## 2018-07-17 NOTE — Progress Notes (Addendum)
Pt discharged to [facility] via EMS at 1905. Report called to [place] at 1750. Husband, [name], called and updated at 43. Pt was A&Ox4. VSS. AVS and discharge packet given to EMS.

## 2018-07-17 NOTE — TOC Transition Note (Addendum)
Transition of Care [facility]) - CM/SW Discharge Note   Patient Details  Name: Katherine [name] MRN: [id number] Date of Birth: 1940/04/07  Transition of Care [facility]) CM/SW Contact:  Ross Ludwig, LCSW Phone Number: 07/17/2018, 3:08 PM   Clinical Narrative:     Patient to be d/c'ed today to [facility] SNF room 33B.  Patient and family agreeable to plans will transport via ems RN to call report [phone].  CSW left message on voice mail for patient's husband [name].  3:24 pm, CSW received phone call from patient's husband, he is aware that patient is discharging today.    Final next level of care: Skilled Nursing Facility Barriers to Discharge: Barriers Resolved   Patient Goals and CMS Choice Patient states their goals for this hospitalization and ongoing recovery are:: Go back to [place] CMS Medicare.gov Compare Post Acute Care list provided to:: Patient Choice offered to / list presented to : Patient  Discharge Placement   Existing PASRR number confirmed : 07/17/18          Patient chooses bed at: [facility] Patient to be transferred to facility by: [facility] Name of family member notified: Left message on patient's husband Jame's phone, [phone] Patient and family notified of of transfer: 07/17/18  Discharge Plan and Services   Discharge Planning Services: CM Consult Post Acute Care Choice: Home Health            DME Agency: NA       HH Arranged: NA [place] Agency: NA        Social Determinants of Health (SDOH) Interventions     Readmission Risk Interventions Readmission Risk Prevention Plan 07/16/2018 04/29/2018 04/29/2018  Transportation Screening Complete Complete Complete  Medication Review Press photographer) Complete Complete Complete  PCP or Specialist appointment within 3-5 days of discharge Complete Not Complete -  PCP/Specialist Appt Not Complete comments - Patient to return to SNF, and they will make  appointments  -  [place] or Home Care Consult Complete (No Data) -  SW Recovery Care/Counseling Consult Not Complete (No Data) -  SW Consult Not Complete Comments na - -  Palliative Care Screening Not Applicable Complete -  Medication Reconcilation ([place] Complete Complete -  Some recent data might be hidden

## 2018-07-18 DIAGNOSIS — J449 Chronic obstructive pulmonary disease, unspecified: Secondary | ICD-10-CM | POA: Diagnosis not present

## 2018-07-18 DIAGNOSIS — E1169 Type 2 diabetes mellitus with other specified complication: Secondary | ICD-10-CM | POA: Diagnosis not present

## 2018-07-18 DIAGNOSIS — E785 Hyperlipidemia, unspecified: Secondary | ICD-10-CM | POA: Diagnosis not present

## 2018-07-18 DIAGNOSIS — I10 Essential (primary) hypertension: Secondary | ICD-10-CM | POA: Diagnosis not present

## 2018-07-19 LAB — URINE CULTURE: Culture: >=100000 — AB

## 2018-07-20 LAB — CULTURE, BLOOD (ROUTINE X 2)
Culture: NO GROWTH
Culture: NO GROWTH
Special Requests: ADEQUATE
Special Requests: ADEQUATE

## 2018-07-23 DIAGNOSIS — R0789 Other chest pain: Secondary | ICD-10-CM | POA: Diagnosis not present

## 2018-07-23 DIAGNOSIS — A0472 Enterocolitis due to Clostridium difficile, not specified as recurrent: Secondary | ICD-10-CM | POA: Diagnosis not present

## 2018-07-26 ENCOUNTER — Inpatient Hospital Stay: Payer: Medicare HMO | Admitting: Family Medicine

## 2018-08-01 DIAGNOSIS — I639 Cerebral infarction, unspecified: Secondary | ICD-10-CM | POA: Diagnosis not present

## 2018-08-01 DIAGNOSIS — J441 Chronic obstructive pulmonary disease with (acute) exacerbation: Secondary | ICD-10-CM | POA: Diagnosis not present

## 2018-08-01 DIAGNOSIS — D649 Anemia, unspecified: Secondary | ICD-10-CM | POA: Diagnosis not present

## 2018-08-01 DIAGNOSIS — E1165 Type 2 diabetes mellitus with hyperglycemia: Secondary | ICD-10-CM | POA: Diagnosis not present

## 2018-08-06 DIAGNOSIS — A0472 Enterocolitis due to Clostridium difficile, not specified as recurrent: Secondary | ICD-10-CM | POA: Diagnosis not present

## 2018-08-06 DIAGNOSIS — E1165 Type 2 diabetes mellitus with hyperglycemia: Secondary | ICD-10-CM | POA: Diagnosis not present

## 2018-08-06 DIAGNOSIS — J441 Chronic obstructive pulmonary disease with (acute) exacerbation: Secondary | ICD-10-CM | POA: Diagnosis not present

## 2018-08-06 DIAGNOSIS — I639 Cerebral infarction, unspecified: Secondary | ICD-10-CM | POA: Diagnosis not present

## 2018-08-13 DIAGNOSIS — J449 Chronic obstructive pulmonary disease, unspecified: Secondary | ICD-10-CM | POA: Diagnosis not present

## 2018-08-19 DIAGNOSIS — R05 Cough: Secondary | ICD-10-CM | POA: Diagnosis not present

## 2018-08-21 ENCOUNTER — Inpatient Hospital Stay
Admission: EM | Admit: 2018-08-21 | Discharge: 2018-08-26 | DRG: 689 | Disposition: A | Discharge status: Skilled Nursing Facility | Payer: Medicare HMO | Attending: Internal Medicine | Admitting: Internal Medicine

## 2018-08-21 ENCOUNTER — Emergency Department: Payer: Medicare HMO

## 2018-08-21 ENCOUNTER — Other Ambulatory Visit: Payer: Self-pay

## 2018-08-21 DIAGNOSIS — B9689 Other specified bacterial agents as the cause of diseases classified elsewhere: Secondary | ICD-10-CM | POA: Diagnosis not present

## 2018-08-21 DIAGNOSIS — Z8619 Personal history of other infectious and parasitic diseases: Secondary | ICD-10-CM

## 2018-08-21 DIAGNOSIS — Z87891 Personal history of nicotine dependence: Secondary | ICD-10-CM | POA: Diagnosis not present

## 2018-08-21 DIAGNOSIS — E1122 Type 2 diabetes mellitus with diabetic chronic kidney disease: Secondary | ICD-10-CM | POA: Diagnosis not present

## 2018-08-21 DIAGNOSIS — K219 Gastro-esophageal reflux disease without esophagitis: Secondary | ICD-10-CM | POA: Diagnosis present

## 2018-08-21 DIAGNOSIS — J449 Chronic obstructive pulmonary disease, unspecified: Secondary | ICD-10-CM | POA: Diagnosis not present

## 2018-08-21 DIAGNOSIS — A499 Bacterial infection, unspecified: Secondary | ICD-10-CM

## 2018-08-21 DIAGNOSIS — Z8673 Personal history of transient ischemic attack (TIA), and cerebral infarction without residual deficits: Secondary | ICD-10-CM

## 2018-08-21 DIAGNOSIS — I130 Hypertensive heart and chronic kidney disease with heart failure and stage 1 through stage 4 chronic kidney disease, or unspecified chronic kidney disease: Secondary | ICD-10-CM | POA: Diagnosis present

## 2018-08-21 DIAGNOSIS — F329 Major depressive disorder, single episode, unspecified: Secondary | ICD-10-CM | POA: Diagnosis present

## 2018-08-21 DIAGNOSIS — Z7951 Long term (current) use of inhaled steroids: Secondary | ICD-10-CM

## 2018-08-21 DIAGNOSIS — R5381 Other malaise: Secondary | ICD-10-CM | POA: Diagnosis not present

## 2018-08-21 DIAGNOSIS — Z7401 Bed confinement status: Secondary | ICD-10-CM | POA: Diagnosis not present

## 2018-08-21 DIAGNOSIS — N183 Chronic kidney disease, stage 3 (moderate): Secondary | ICD-10-CM | POA: Diagnosis not present

## 2018-08-21 DIAGNOSIS — G9340 Encephalopathy, unspecified: Secondary | ICD-10-CM

## 2018-08-21 DIAGNOSIS — G9349 Other encephalopathy: Secondary | ICD-10-CM | POA: Diagnosis not present

## 2018-08-21 DIAGNOSIS — I5032 Chronic diastolic (congestive) heart failure: Secondary | ICD-10-CM | POA: Diagnosis present

## 2018-08-21 DIAGNOSIS — G9341 Metabolic encephalopathy: Secondary | ICD-10-CM | POA: Diagnosis present

## 2018-08-21 DIAGNOSIS — N390 Urinary tract infection, site not specified: Secondary | ICD-10-CM

## 2018-08-21 DIAGNOSIS — Z96651 Presence of right artificial knee joint: Secondary | ICD-10-CM | POA: Diagnosis present

## 2018-08-21 DIAGNOSIS — Z8249 Family history of ischemic heart disease and other diseases of the circulatory system: Secondary | ICD-10-CM

## 2018-08-21 DIAGNOSIS — R079 Chest pain, unspecified: Secondary | ICD-10-CM | POA: Diagnosis not present

## 2018-08-21 DIAGNOSIS — R0602 Shortness of breath: Secondary | ICD-10-CM | POA: Diagnosis not present

## 2018-08-21 DIAGNOSIS — E785 Hyperlipidemia, unspecified: Secondary | ICD-10-CM | POA: Diagnosis present

## 2018-08-21 DIAGNOSIS — Z7984 Long term (current) use of oral hypoglycemic drugs: Secondary | ICD-10-CM

## 2018-08-21 DIAGNOSIS — Z888 Allergy status to other drugs, medicaments and biological substances status: Secondary | ICD-10-CM

## 2018-08-21 DIAGNOSIS — R531 Weakness: Secondary | ICD-10-CM | POA: Diagnosis not present

## 2018-08-21 DIAGNOSIS — Z1159 Encounter for screening for other viral diseases: Secondary | ICD-10-CM

## 2018-08-21 DIAGNOSIS — Z87440 Personal history of urinary (tract) infections: Secondary | ICD-10-CM

## 2018-08-21 DIAGNOSIS — Z91030 Bee allergy status: Secondary | ICD-10-CM

## 2018-08-21 DIAGNOSIS — D631 Anemia in chronic kidney disease: Secondary | ICD-10-CM | POA: Diagnosis not present

## 2018-08-21 DIAGNOSIS — F419 Anxiety disorder, unspecified: Secondary | ICD-10-CM | POA: Diagnosis present

## 2018-08-21 DIAGNOSIS — Z7902 Long term (current) use of antithrombotics/antiplatelets: Secondary | ICD-10-CM

## 2018-08-21 DIAGNOSIS — E559 Vitamin D deficiency, unspecified: Secondary | ICD-10-CM | POA: Diagnosis present

## 2018-08-21 DIAGNOSIS — Z1612 Extended spectrum beta lactamase (ESBL) resistance: Secondary | ICD-10-CM | POA: Diagnosis not present

## 2018-08-21 DIAGNOSIS — M2550 Pain in unspecified joint: Secondary | ICD-10-CM | POA: Diagnosis not present

## 2018-08-21 DIAGNOSIS — R4182 Altered mental status, unspecified: Secondary | ICD-10-CM | POA: Diagnosis not present

## 2018-08-21 DIAGNOSIS — R197 Diarrhea, unspecified: Secondary | ICD-10-CM | POA: Diagnosis not present

## 2018-08-21 DIAGNOSIS — M1990 Unspecified osteoarthritis, unspecified site: Secondary | ICD-10-CM | POA: Diagnosis present

## 2018-08-21 DIAGNOSIS — Z20828 Contact with and (suspected) exposure to other viral communicable diseases: Secondary | ICD-10-CM | POA: Diagnosis not present

## 2018-08-21 DIAGNOSIS — N186 End stage renal disease: Secondary | ICD-10-CM | POA: Diagnosis not present

## 2018-08-21 DIAGNOSIS — R000 Tachycardia, unspecified: Secondary | ICD-10-CM | POA: Diagnosis not present

## 2018-08-21 DIAGNOSIS — G40909 Epilepsy, unspecified, not intractable, without status epilepticus: Secondary | ICD-10-CM | POA: Diagnosis present

## 2018-08-21 DIAGNOSIS — Z79899 Other long term (current) drug therapy: Secondary | ICD-10-CM

## 2018-08-21 HISTORY — DX: Urinary tract infection, site not specified: N39.0

## 2018-08-21 LAB — GASTROINTESTINAL PANEL BY PCR, STOOL (REPLACES STOOL CULTURE)

## 2018-08-21 LAB — COMPREHENSIVE METABOLIC PANEL
ALT: 34 U/L (ref 0–44)
AST: 57 U/L — ABNORMAL HIGH (ref 15–41)
Albumin: 2.4 g/dL — ABNORMAL LOW (ref 3.5–5.0)
Alkaline Phosphatase: 176 U/L — ABNORMAL HIGH (ref 38–126)
Anion gap: 9 (ref 5–15)
BUN: 13 mg/dL (ref 8–23)
CO2: 24 mmol/L (ref 22–32)
Calcium: 9.4 mg/dL (ref 8.9–10.3)
Chloride: 106 mmol/L (ref 98–111)
Creatinine, Ser: 1.45 mg/dL — ABNORMAL HIGH (ref 0.44–1.00)
GFR calc Af Amer: 40 mL/min — ABNORMAL LOW (ref >60)
GFR calc non Af Amer: 35 mL/min — ABNORMAL LOW (ref >60)
Glucose, Bld: 98 mg/dL (ref 70–99)
Potassium: 4.2 mmol/L (ref 3.5–5.1)
Sodium: 139 mmol/L (ref 135–145)
Total Bilirubin: 0.4 mg/dL (ref 0.3–1.2)
Total Protein: 6.7 g/dL (ref 6.5–8.1)

## 2018-08-21 LAB — URINALYSIS, COMPLETE (UACMP) WITH MICROSCOPIC
Bilirubin Urine: NEGATIVE
Glucose, UA: NEGATIVE mg/dL
Hgb urine dipstick: NEGATIVE
Ketones, ur: NEGATIVE mg/dL
Nitrite: NEGATIVE
Protein, ur: NEGATIVE mg/dL
Specific Gravity, Urine: 1.014 (ref 1.005–1.030)
Squamous Epithelial / LPF: NONE SEEN (ref 0–5)
WBC, UA: >50 WBC/hpf — ABNORMAL HIGH (ref 0–5)
pH: 5 (ref 5.0–8.0)

## 2018-08-21 LAB — CBC WITH DIFFERENTIAL/PLATELET
Abs Immature Granulocytes: 0.04 10*3/uL (ref 0.00–0.07)
Basophils Absolute: 0 10*3/uL (ref 0.0–0.1)
Basophils Relative: 0 %
Eosinophils Absolute: 0.1 10*3/uL (ref 0.0–0.5)
Eosinophils Relative: 2 %
HCT: 30.9 % — ABNORMAL LOW (ref 36.0–46.0)
Hemoglobin: 9.9 g/dL — ABNORMAL LOW (ref 12.0–15.0)
Immature Granulocytes: 1 %
Lymphocytes Relative: 15 %
Lymphs Abs: 1.3 10*3/uL (ref 0.7–4.0)
MCH: 26.3 pg (ref 26.0–34.0)
MCHC: 32 g/dL (ref 30.0–36.0)
MCV: 82.2 fL (ref 80.0–100.0)
Monocytes Absolute: 0.7 10*3/uL (ref 0.1–1.0)
Monocytes Relative: 8 %
Neutro Abs: 6.1 10*3/uL (ref 1.7–7.7)
Neutrophils Relative %: 74 %
Platelets: 470 10*3/uL — ABNORMAL HIGH (ref 150–400)
RBC: 3.76 MIL/uL — ABNORMAL LOW (ref 3.87–5.11)
RDW: 19.7 % — ABNORMAL HIGH (ref 11.5–15.5)
WBC: 8.2 10*3/uL (ref 4.0–10.5)
nRBC: 0 % (ref 0.0–0.2)

## 2018-08-21 LAB — SARS CORONAVIRUS 2 BY RT PCR (HOSPITAL ORDER, PERFORMED IN ~~LOC~~ HOSPITAL LAB): SARS Coronavirus 2: NEGATIVE

## 2018-08-21 LAB — BLOOD GAS, VENOUS
Acid-Base Excess: 2.4 mmol/L — ABNORMAL HIGH (ref 0.0–2.0)
Bicarbonate: 27.2 mmol/L (ref 20.0–28.0)
O2 Saturation: 95.3 %
Patient temperature: 37
pCO2, Ven: 42 mmHg — ABNORMAL LOW (ref 44.0–60.0)
pH, Ven: 7.42 (ref 7.250–7.430)
pO2, Ven: 76 mmHg — ABNORMAL HIGH (ref 32.0–45.0)

## 2018-08-21 LAB — LACTIC ACID, PLASMA: Lactic Acid, Venous: 1.5 mmol/L (ref 0.5–1.9)

## 2018-08-21 MED ORDER — MECLIZINE HCL 25 MG PO TABS
25.0000 mg | ORAL_TABLET | Freq: Two times a day (BID) | ORAL | Status: DC | PRN
  Filled 2018-08-21: qty 1

## 2018-08-21 MED ORDER — LORATADINE 10 MG PO TABS
10.0000 mg | ORAL_TABLET | Freq: Every day | ORAL | Status: DC
  Administered 2018-08-22 – 2018-08-26 (×5): 10 mg via ORAL
  Filled 2018-08-21 (×5): qty 1

## 2018-08-21 MED ORDER — IPRATROPIUM-ALBUTEROL 0.5-2.5 (3) MG/3ML IN SOLN
3.0000 mL | Freq: Three times a day (TID) | RESPIRATORY_TRACT | Status: DC
  Administered 2018-08-21 – 2018-08-26 (×14): 3 mL via RESPIRATORY_TRACT
  Filled 2018-08-21 (×14): qty 3

## 2018-08-21 MED ORDER — MUPIROCIN 2 % EX OINT
1.0000 "application " | TOPICAL_OINTMENT | Freq: Two times a day (BID) | CUTANEOUS | Status: DC
  Administered 2018-08-21 – 2018-08-26 (×10): 1 via NASAL
  Filled 2018-08-21: qty 22

## 2018-08-21 MED ORDER — ACETAMINOPHEN 325 MG PO TABS
650.0000 mg | ORAL_TABLET | Freq: Three times a day (TID) | ORAL | Status: DC
  Administered 2018-08-21 – 2018-08-26 (×14): 650 mg via ORAL
  Filled 2018-08-21 (×13): qty 2

## 2018-08-21 MED ORDER — ATORVASTATIN CALCIUM 20 MG PO TABS
80.0000 mg | ORAL_TABLET | Freq: Every evening | ORAL | Status: DC
  Administered 2018-08-21 – 2018-08-25 (×5): 80 mg via ORAL
  Filled 2018-08-21 (×5): qty 4

## 2018-08-21 MED ORDER — ADULT MULTIVITAMIN W/MINERALS CH
1.0000 | ORAL_TABLET | Freq: Every day | ORAL | Status: DC
  Administered 2018-08-22 – 2018-08-26 (×5): 1 via ORAL
  Filled 2018-08-21 (×5): qty 1

## 2018-08-21 MED ORDER — LEVETIRACETAM ER 500 MG PO TB24
500.0000 mg | ORAL_TABLET | Freq: Every day | ORAL | Status: DC
  Administered 2018-08-22 – 2018-08-26 (×5): 500 mg via ORAL
  Filled 2018-08-21 (×5): qty 1

## 2018-08-21 MED ORDER — VITAMIN B-12 1000 MCG PO TABS
1000.0000 ug | ORAL_TABLET | Freq: Every day | ORAL | Status: DC
  Administered 2018-08-22 – 2018-08-26 (×5): 1000 ug via ORAL
  Filled 2018-08-21 (×5): qty 1

## 2018-08-21 MED ORDER — VITAMIN D 25 MCG (1000 UNIT) PO TABS
1000.0000 [IU] | ORAL_TABLET | Freq: Every day | ORAL | Status: DC
  Administered 2018-08-22 – 2018-08-26 (×5): 1000 [IU] via ORAL
  Filled 2018-08-21 (×5): qty 1

## 2018-08-21 MED ORDER — PIPERACILLIN-TAZOBACTAM 3.375 G IVPB
3.3750 g | Freq: Three times a day (TID) | INTRAVENOUS | Status: AC
  Administered 2018-08-21 – 2018-08-25 (×13): 3.375 g via INTRAVENOUS
  Filled 2018-08-21 (×13): qty 50

## 2018-08-21 MED ORDER — SODIUM CHLORIDE 0.9 % IV SOLN: 1.0000 g | Freq: Three times a day (TID) | INTRAVENOUS | Status: DC

## 2018-08-21 MED ORDER — NYSTATIN 100000 UNIT/ML MT SUSP
5.0000 mL | Freq: Four times a day (QID) | OROMUCOSAL | Status: DC
  Administered 2018-08-21 – 2018-08-26 (×17): 500000 [IU] via ORAL
  Filled 2018-08-21 (×15): qty 5

## 2018-08-21 MED ORDER — LORAZEPAM 2 MG/ML IJ SOLN: 1.0000 mg | Freq: Every day | INTRAMUSCULAR | Status: DC | PRN

## 2018-08-21 MED ORDER — HEPARIN SODIUM (PORCINE) 5000 UNIT/ML IJ SOLN
5000.0000 [IU] | Freq: Three times a day (TID) | INTRAMUSCULAR | Status: DC
  Administered 2018-08-21 – 2018-08-26 (×14): 5000 [IU] via SUBCUTANEOUS
  Filled 2018-08-21 (×11): qty 1

## 2018-08-21 MED ORDER — GABAPENTIN 300 MG PO CAPS
300.0000 mg | ORAL_CAPSULE | Freq: Every day | ORAL | Status: DC
  Administered 2018-08-21 – 2018-08-25 (×5): 300 mg via ORAL
  Filled 2018-08-21 (×5): qty 1

## 2018-08-21 MED ORDER — COLCHICINE 0.6 MG PO TABS
0.6000 mg | ORAL_TABLET | Freq: Every day | ORAL | Status: DC
  Administered 2018-08-22 – 2018-08-26 (×5): 0.6 mg via ORAL
  Filled 2018-08-21 (×5): qty 1

## 2018-08-21 MED ORDER — PIPERACILLIN-TAZOBACTAM 3.375 G IVPB 30 MIN
3.3750 g | Freq: Once | INTRAVENOUS | Status: AC
  Administered 2018-08-21: 3.375 g via INTRAVENOUS
  Filled 2018-08-21: qty 50

## 2018-08-21 MED ORDER — PANTOPRAZOLE SODIUM 40 MG PO TBEC
40.0000 mg | DELAYED_RELEASE_TABLET | Freq: Every day | ORAL | Status: DC
  Administered 2018-08-22 – 2018-08-26 (×5): 40 mg via ORAL
  Filled 2018-08-21 (×5): qty 1

## 2018-08-21 MED ORDER — CLOPIDOGREL BISULFATE 75 MG PO TABS
75.0000 mg | ORAL_TABLET | Freq: Every day | ORAL | Status: DC
  Administered 2018-08-22 – 2018-08-26 (×5): 75 mg via ORAL
  Filled 2018-08-21 (×5): qty 1

## 2018-08-21 MED ORDER — LINAGLIPTIN 5 MG PO TABS
5.0000 mg | ORAL_TABLET | Freq: Every day | ORAL | Status: DC
  Administered 2018-08-22 – 2018-08-26 (×5): 5 mg via ORAL
  Filled 2018-08-21 (×5): qty 1

## 2018-08-21 MED ORDER — SODIUM CHLORIDE 0.9% FLUSH: 3.0000 mL | Freq: Once | INTRAVENOUS | Status: DC

## 2018-08-21 MED ORDER — HYDRALAZINE HCL 50 MG PO TABS
25.0000 mg | ORAL_TABLET | Freq: Three times a day (TID) | ORAL | Status: DC
  Administered 2018-08-21 – 2018-08-26 (×11): 25 mg via ORAL
  Filled 2018-08-21 (×13): qty 1

## 2018-08-21 MED ORDER — FLUOXETINE HCL 20 MG PO CAPS
40.0000 mg | ORAL_CAPSULE | Freq: Every day | ORAL | Status: DC
  Administered 2018-08-22 – 2018-08-26 (×5): 40 mg via ORAL
  Filled 2018-08-21 (×5): qty 2

## 2018-08-21 MED ORDER — DOCUSATE SODIUM 100 MG PO CAPS
100.0000 mg | ORAL_CAPSULE | Freq: Two times a day (BID) | ORAL | Status: DC
  Filled 2018-08-21 (×9): qty 1

## 2018-08-21 NOTE — ED Notes (Signed)
Resumed care from shannon rn.  Pt alert.  nsr on monitor. Iv in place.  Pt on 2 liters oxygen.  Pt waiting for admission. abx infusing.   Skin warm and dry.  siderails up x 2.

## 2018-08-21 NOTE — ED Notes (Signed)
Report  Called to [facility]

## 2018-08-21 NOTE — ED Notes (Signed)
Pt reports feeling better and denies pain.  nsr on monitor.  Iv in place.

## 2018-08-21 NOTE — ED Notes (Signed)
Floor unable to take report at this time.

## 2018-08-21 NOTE — ED Provider Notes (Signed)
[facility] Emergency Department Provider Note    First MD Initiated Contact with Patient 08/21/18 1258     (approximate)  I have reviewed the triage vital signs and the nursing notes.   HISTORY  Chief Complaint Shortness of Breath and Weakness  Level V Caveat:  AMS  HPI Katherine [name] is a 78 y.o. female has a past medical history as listed below presents the ER for reported altered mental status.  According to [place] the patient is typically talkative interactive with staff and co-residents.  Has been less talkative and altered for the past 3 days.  Reported she is also been complaining of shortness of breath.  Does wear home oxygen.  Unable to provide much additional history.  Patient is able to follow commands but not able to verbalize any complaints.  Denies any pain.    Past Medical History:  Diagnosis Date  . Angioedema   . Anxiety and depression   . CHF (congestive heart failure) ([place])   . Chronic constipation   . Chronic kidney disease    stage 3  . COPD (chronic obstructive pulmonary disease) ([place])   . Diabetes mellitus without complication ([place])   . Gallstones   . GERD (gastroesophageal reflux disease)   . Hypertension   . Left ventricular hypertrophy   . Osteoarthritis   . Prurigo nodularis   . Seizures ([place])   . Stroke ([place])   . Tobacco abuse   . Vitamin D deficiency disease    Family History  Problem Relation Age of Onset  . Alcohol abuse Mother   . Sickle cell anemia Daughter   . Hypertension Son   . Cancer Neg Hx   . COPD Neg Hx   . Diabetes Neg Hx   . Heart disease Neg Hx   . Stroke Neg Hx    Past Surgical History:  Procedure Laterality Date  . CHOLECYSTECTOMY    . 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   Procedure: TOTAL KNEE ARTHROPLASTY;  Surgeon: Hessie Knows, MD;  Location: [facility] ORS;  Service: Orthopedics;  Laterality: Right;   Patient Active Problem List   Diagnosis Date Noted  . Chest pain 07/15/2018  . Palliative care encounter 05/10/2018  . Localized edema 05/10/2018  . Shortness of breath 05/10/2018  . Hematemesis 04/28/2018  . Influenza A 04/13/2018  . Acute on chronic congestive heart failure ([place])   . COPD with acute exacerbation ([place]) 02/24/2018  . Chronic diastolic heart failure ([place]) 12/31/2017  . Lymphedema 12/31/2017  . COPD exacerbation ([place]) 11/30/2017  . Urinary tract infection 11/22/2017  . 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[place]) 11/16/2017  . Diabetes mellitus type 2, uncomplicated ([place]) 67/01/4579  . Pressure injury of skin 10/29/2017  . Acute kidney injury superimposed on CKD ([place]) 10/24/2017  . Severe sepsis ([place]) 10/24/2017  . Possible Seizures ([place]) 10/08/2017  . Hyperlipemia 10/08/2017  . 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 Stroke-like episode ([place]) s/p IV tpa 10/04/2017  . Palliative care by specialist   . Elevated rheumatoid factor 09/05/2017  . Frequent hospital admissions 08/22/2017  . Aphasia 06/28/2017  . Hand pain 03/21/2017  . Dry skin 03/21/2017  . Pain in finger of left hand 09/12/2016  . Chronic fatigue 06/12/2016  . Left knee pain 06/12/2016  . Goals of care, counseling/discussion 03/13/2016  . Primary localized osteoarthritis of right knee 02/08/2016  . OSA (obstructive sleep apnea) 09/16/2015  . Rotator cuff  syndrome 09/07/2015  . 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 Sleep disturbance 04/14/2015  . Coronary artery disease 03/14/2015  . 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 Acute respiratory failure with hypoxia ([place]) 09/16/2014  . 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 Anxiety   . Tobacco abuse   . Prurigo nodularis   . GERD (gastroesophageal reflux disease)   . COPD (chronic obstructive pulmonary disease) ([place])   . Osteoarthritis   . Vitamin D deficiency disease   . Chronic constipation   . CKD (chronic kidney disease), stage III ([place])   . Essential hypertension  05/20/2013  . Morbid obesity ([place]) 05/20/2013      Prior to Admission medications   Medication Sig Start Date End Date Taking? Authorizing Provider  albuterol (ACCUNEB) 0.63 MG/3ML nebulizer solution Take 3 mLs by nebulization every 8 (eight) hours as needed for wheezing.    Yes [provider]  atorvastatin (LIPITOR) 80 MG tablet Take 80 mg by mouth every evening.    Yes [provider]  cetirizine (ZYRTEC) 10 MG tablet Take 5 mg by mouth at bedtime.   Yes [provider]  cholecalciferol (VITAMIN D) 1000 units tablet Take 1,000 Units by mouth daily.   Yes [provider]  clopidogrel (PLAVIX) 75 MG tablet Take 75 mg by mouth daily.   Yes [provider]  diclofenac sodium (VOLTAREN) 1 % GEL Apply 2 g topically every 12 (twelve) hours as needed (for left knee pain).    Yes [provider]  docusate sodium (COLACE) 100 MG capsule Take 100 mg by mouth 2 (two) times daily.   Yes [provider]  FLUoxetine (PROZAC) 40 MG capsule Take 40 mg by mouth daily.    Yes [provider]  Fluticasone-Umeclidin-Vilant 100-62.5-25 MCG/INH AEPB Inhale 1 puff into the lungs daily.   Yes [provider]  gabapentin (NEURONTIN) 300 MG capsule Take 300 mg by mouth at bedtime.   Yes [provider]  hydrALAZINE (APRESOLINE) 25 MG tablet Take 25 mg by mouth 3 (three) times daily. 06/19/18  Yes [provider]  ipratropium (ATROVENT) 0.03 % nasal spray Place 2 sprays into both nostrils 2 (two) times daily.    Yes [provider]  ipratropium-albuterol (DUONEB) 0.5-2.5 (3) MG/3ML SOLN Take 3 mLs by nebulization 3 (three) times daily. 07/17/18  Yes Gladstone Lighter, MD  levETIRAcetam (KEPPRA XR) 500 MG 24 hr tablet Take 1 tablet (500 mg total) by mouth daily. 11/19/17  Yes Venancio Poisson, NP  linagliptin (TRADJENTA) 5 MG TABS tablet Take 5 mg by mouth daily.   Yes [provider]  Multiple Vitamin  (MULTIVITAMIN WITH MINERALS) TABS tablet Take 1 tablet by mouth daily.   Yes [provider]  mupirocin ointment (BACTROBAN) 2 % Place 1 application into the nose 2 (two) times daily. 06/13/18  Yes Wieting, Richard, MD  nystatin (MYCOSTATIN) 100000 UNIT/ML suspension Take 5 mLs (500,000 Units total) by mouth 4 (four) times daily. 06/13/18  Yes Wieting, Richard, MD  pantoprazole (PROTONIX) 40 MG tablet Take 1 tablet (40 mg total) by mouth daily. 04/29/18  Yes Bettey Costa, MD  vitamin B-12 1000 MCG tablet Take 1 tablet (1,000 mcg total) by mouth daily. 04/30/18  Yes Bettey Costa, MD  acetaminophen (TYLENOL) 650 MG CR tablet Take 650 mg by mouth 3 (three) times daily.     [provider]  albuterol (PROVENTIL HFA;VENTOLIN HFA) 108 (90 Base) MCG/ACT inhaler Inhale 2 puffs into the lungs every 4 (four) hours as  needed for wheezing or shortness of breath.    [provider]  colchicine 0.6 MG tablet Take 1 tablet (0.6 mg total) by mouth daily. 06/14/18   Loletha Grayer, MD  LORazepam (ATIVAN) 2 MG/ML injection Inject 0.5 mLs (1 mg total) into the muscle daily as needed for seizure. 06/13/18   Loletha Grayer, MD  meclizine (ANTIVERT) 25 MG tablet Take 25 mg by mouth every 12 (twelve) hours as needed for dizziness.     [provider]    Allergies Bee venom and Enalapril maleate    Social History Social History   Tobacco Use  . Smoking status: Former Smoker    Packs/day: 0.50    Years: 60.00    Pack years: 30.00    Types: Cigarettes  . Smokeless tobacco: Never Used  . Tobacco comment: quite 49mo ago-03/26/18  Substance Use Topics  . Alcohol use: No    Alcohol/week: 0.0 standard drinks    Comment: rare  . Drug use: No    Review of Systems Patient denies headaches, rhinorrhea, blurry vision, numbness, shortness of breath, chest pain, edema, cough, abdominal pain, nausea, vomiting, diarrhea, dysuria, fevers, rashes or hallucinations unless otherwise stated above  in HPI. ____________________________________________   PHYSICAL EXAM:  VITAL SIGNS: Vitals:   08/21/18 1300 08/21/18 1408  BP: 110/86   Pulse: (!) 114   Resp: (!) 28   Temp:  99.7 F (37.6 C)  SpO2: 99%     Constitutional: Alert and oriented.  Eyes: Conjunctivae are normal.  Head: Atraumatic. Nose: No congestion/rhinnorhea. Mouth/Throat: Mucous membranes are moist.   Neck: No stridor. Painless ROM.  Cardiovascular: Normal rate, regular rhythm. Grossly normal heart sounds.  Good peripheral circulation. Respiratory: Normal respiratory effort.  No retractions. Lungs CTAB. Gastrointestinal: Soft and nontender. No distention. No abdominal bruits. No CVA tenderness. Genitourinary:  Musculoskeletal: No lower extremity tenderness nor edema.  No joint effusions. Neurologic:  Normal speech and language. No gross focal neurologic deficits are appreciated. No facial droop Skin:  Skin is warm, dry and intact. No rash noted. Psychiatric: Mood and affect are normal. Speech and behavior are normal.  ____________________________________________   LABS (all labs ordered are listed, but only abnormal results are displayed)  Results for orders placed or performed during the hospital encounter of 08/21/18 (from the past 24 hour(s))  Lactic acid, plasma     Status: None   Collection Time: 08/21/18  1:05 PM  Result Value Ref Range   Lactic Acid, Venous 1.5 0.5 - 1.9 mmol/L  Comprehensive metabolic panel     Status: Abnormal   Collection Time: 08/21/18  1:05 PM  Result Value Ref Range   Sodium 139 135 - 145 mmol/L   Potassium 4.2 3.5 - 5.1 mmol/L   Chloride 106 98 - 111 mmol/L   CO2 24 22 - 32 mmol/L   Glucose, Bld 98 70 - 99 mg/dL   BUN 13 8 - 23 mg/dL   Creatinine, Ser 1.45 (H) 0.44 - 1.00 mg/dL   Calcium 9.4 8.9 - 10.3 mg/dL   Total Protein 6.7 6.5 - 8.1 g/dL   Albumin 2.4 (L) 3.5 - 5.0 g/dL   AST 57 (H) 15 - 41 U/L   ALT 34 0 - 44 U/L   Alkaline Phosphatase 176 (H) 38 - 126 U/L    Total Bilirubin 0.4 0.3 - 1.2 mg/dL   GFR calc non Af Amer 35 (L) >60 mL/min   GFR calc Af Amer 40 (L) >60 mL/min   Anion  gap 9 5 - 15  CBC with Differential     Status: Abnormal   Collection Time: 08/21/18  1:05 PM  Result Value Ref Range   WBC 8.2 4.0 - 10.5 K/uL   RBC 3.76 (L) 3.87 - 5.11 MIL/uL   Hemoglobin 9.9 (L) 12.0 - 15.0 g/dL   HCT 30.9 (L) 36.0 - 46.0 %   MCV 82.2 80.0 - 100.0 fL   MCH 26.3 26.0 - 34.0 pg   MCHC 32.0 30.0 - 36.0 g/dL   RDW 19.7 (H) 11.5 - 15.5 %   Platelets 470 (H) 150 - 400 K/uL   nRBC 0.0 0.0 - 0.2 %   Neutrophils Relative % 74 %   Neutro Abs 6.1 1.7 - 7.7 K/uL   Lymphocytes Relative 15 %   Lymphs Abs 1.3 0.7 - 4.0 K/uL   Monocytes Relative 8 %   Monocytes Absolute 0.7 0.1 - 1.0 K/uL   Eosinophils Relative 2 %   Eosinophils Absolute 0.1 0.0 - 0.5 K/uL   Basophils Relative 0 %   Basophils Absolute 0.0 0.0 - 0.1 K/uL   Immature Granulocytes 1 %   Abs Immature Granulocytes 0.04 0.00 - 0.07 K/uL  Urinalysis, Complete w Microscopic     Status: Abnormal   Collection Time: 08/21/18  1:05 PM  Result Value Ref Range   Color, Urine YELLOW (A) YELLOW   APPearance CLOUDY (A) CLEAR   Specific Gravity, Urine 1.014 1.005 - 1.030   pH 5.0 5.0 - 8.0   Glucose, UA NEGATIVE NEGATIVE mg/dL   Hgb urine dipstick NEGATIVE NEGATIVE   Bilirubin Urine NEGATIVE NEGATIVE   Ketones, ur NEGATIVE NEGATIVE mg/dL   Protein, ur NEGATIVE NEGATIVE mg/dL   Nitrite NEGATIVE NEGATIVE   Leukocytes,Ua MODERATE (A) NEGATIVE   RBC / HPF 0-5 0 - 5 RBC/hpf   WBC, UA >50 (H) 0 - 5 WBC/hpf   Bacteria, UA MANY (A) NONE SEEN   Squamous Epithelial / LPF NONE SEEN 0 - 5   WBC Clumps PRESENT    Mucus PRESENT    Hyaline Casts, UA PRESENT   Blood gas, venous     Status: Abnormal   Collection Time: 08/21/18  1:32 PM  Result Value Ref Range   pH, Ven 7.42 7.250 - 7.430   pCO2, Ven 42 (L) 44.0 - 60.0 mmHg   pO2, Ven 76.0 (H) 32.0 - 45.0 mmHg   Bicarbonate 27.2 20.0 - 28.0 mmol/L    Acid-Base Excess 2.4 (H) 0.0 - 2.0 mmol/L   O2 Saturation 95.3 %   Patient temperature 37.0    Collection site VEIN    Sample type VEIN    ____________________________________________  EKG My review and personal interpretation at Time: 12:47 Indication: ams  Rate: 110  Rhythm: sinus Axis: normal Other: normal intervals, no stemi ____________________________________________  RADIOLOGY  I personally reviewed all radiographic images ordered to evaluate for the above acute complaints and reviewed radiology reports and findings.  These findings were personally discussed with the patient.  Please see medical record for radiology report.  ____________________________________________   PROCEDURES  Procedure(s) performed:  Procedures    Critical Care performed: no ____________________________________________   INITIAL IMPRESSION / ASSESSMENT AND PLAN / ED COURSE  Pertinent labs & imaging results that were available during my care of the patient were reviewed by me and considered in my medical decision making (see chart for details).   DDX: Dehydration, sepsis, pna, uti, hypoglycemia, cva, drug effect, withdrawal, encephalitis   Katherine [name] is  a 78 y.o. who presents to the ED with symptoms as described above.  Patient with clear encephalopathy very difficult to ascertain how much is new versus old.  After discussion with patient husband she does have a remote history of stroke.  Reportedly is a bit more confused than previous.  Broad differential as listed below.  Does not have any meningismus.  Will order CT imaging as well as blood work to evaluate for any evidence of metabolic process.  Does not seem consistent with seizure.  Clinical Course as of Aug 20 1528  Wed Aug 21, 2018  1453 Low-grade temperature tachycardia urinalysis concerning for UTI with recent urine culture positive for ESBL E. coli.  Will give zosyn as it is sensitive to piptazo.  Patient will require admission  to the hospital.   [PR]    Clinical Course User Index [PR] Merlyn Lot, MD    The patient was evaluated in Emergency Department today for the symptoms described in the history of present illness. He/she was evaluated in the context of the global COVID-19 pandemic, which necessitated consideration that the patient might be at risk for infection with the SARS-CoV-2 virus that causes COVID-19. Institutional protocols and algorithms that pertain to the evaluation of patients at risk for COVID-19 are in a state of rapid change based on information released by regulatory bodies including the CDC and federal and state organizations. These policies and algorithms were followed during the patient's care in the ED.  As part of my medical decision making, I reviewed the following data within the [place] notes reviewed and incorporated, Labs reviewed, notes from prior ED visits and [name] Controlled Substance Database   ____________________________________________   FINAL CLINICAL IMPRESSION(S) / ED DIAGNOSES  Final diagnoses:  Acute encephalopathy  ESBL (extended spectrum beta-lactamase) producing bacteria infection      NEW MEDICATIONS STARTED DURING THIS VISIT:  New Prescriptions   No medications on file     Note:  This document was prepared using Dragon voice recognition software and may include unintentional dictation errors.    Merlyn Lot, MD 08/21/18 1530

## 2018-08-21 NOTE — ED Triage Notes (Signed)
Pt to ED via EMS from [facility] care reporting increased SOB since Monday. Pt is nodding in response to RNs questions but not verbalizing answers. RN called [facility] and was updated by pts nurse:  Pt sent to ED for increased SOB since Monday. Pt has had a cough since Monday with a  Negative chest Xray. Breathing has worsened and pt have been grunting and having snoring respirations since last night. Pt is no longer answering questions verbally which per RN is a change from pts baseline. Pt is normally A&O x 4 and talking without difficulty or SOB. Pt on chronic 2L oxygen via .

## 2018-08-21 NOTE — ED Notes (Signed)
ED TO INPATIENT HANDOFF REPORT  ED Nurse Name and Phone #:  Halston Fairclough S Name/Age/Gender Katherine [name] 78 y.o. female Room/Bed: ED11A/ED11A  Code Status   Code Status: Full Code  Home/SNF/Other Home Patient oriented to: self, place, time and situation Is this baseline? No   Triage Complete: Triage complete  Chief Complaint weakness  Triage Note Pt to ED via EMS from [facility] care reporting increased SOB since Monday. Pt is nodding in response to RNs questions but not verbalizing answers. RN called [facility] and was updated by pts nurse:  Pt sent to ED for increased SOB since Monday. Pt has had a cough since Monday with a  Negative chest Xray. Breathing has worsened and pt have been grunting and having snoring respirations since last night. Pt is no longer answering questions verbally which per RN is a change from pts baseline. Pt is normally A&O x 4 and talking without difficulty or SOB. Pt on chronic 2L oxygen via [place].    Allergies Allergies  Allergen Reactions  . Bee Venom Swelling  . Enalapril Maleate Swelling    Level of Care/Admitting Diagnosis ED Disposition    ED Disposition Condition [facility] Area: [place] [[id number]]  Level of Care: Med-Surg [16]  Covid Evaluation: Confirmed COVID Negative  Diagnosis: UTI (urinary tract infection) [[id number]]  Admitting Physician: Eula Flax  Attending Physician: Rufina Falco ACHIENG [[id number]]  Estimated length of stay: past midnight tomorrow  Certification:: I certify this patient will need inpatient services for at least 2 midnights  PT Class (Do Not Modify): Inpatient [101]  PT Acc Code (Do Not Modify): Private [1]       B Medical/Surgery History Past Medical History:  Diagnosis Date  . Angioedema   . Anxiety and depression   . CHF (congestive heart failure) ([place])   . Chronic constipation   . Chronic kidney disease    stage 3  . COPD (chronic  obstructive pulmonary disease) ([place])   . Diabetes mellitus without complication ([place])   . Gallstones   . GERD (gastroesophageal reflux disease)   . Hypertension   . Left ventricular hypertrophy   . Osteoarthritis   . Prurigo nodularis   . Seizures ([place])   . Stroke ([place])   . Tobacco abuse   . Vitamin D deficiency disease    Past Surgical History:  Procedure Laterality Date  . CHOLECYSTECTOMY    . 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   Procedure: TOTAL KNEE ARTHROPLASTY;  Surgeon: Hessie Knows, MD;  Location: [facility] ORS;  Service: Orthopedics;  Laterality: Right;     A IV Location/Drains/Wounds Patient Lines/Drains/Airways Status   Active Line/Drains/Airways    Name:   Placement date:   Placement time:   Site:   Days:   Peripheral IV 08/21/18 Right Hand   08/21/18    1328    Hand   less than 1   External Urinary Catheter   07/16/18    0800    -   36          Intake/Output Last 24 hours No intake or output data in the 24 hours ending 08/21/18 1634  Labs/Imaging Results for orders placed or performed during the hospital encounter of 08/21/18 (from the past 48 hour(s))  Lactic acid, plasma     Status: None   Collection Time: 08/21/18  1:05 PM  Result Value Ref Range  Lactic Acid, Venous 1.5 0.5 - 1.9 mmol/L    Comment: Performed at [facility], [address] [place], [place] [address]  Comprehensive metabolic panel     Status: Abnormal   Collection Time: 08/21/18  1:05 PM  Result Value Ref Range   Sodium 139 135 - 145 mmol/L   Potassium 4.2 3.5 - 5.1 mmol/L   Chloride 106 98 - 111 mmol/L   CO2 24 22 - 32 mmol/L   Glucose, Bld 98 70 - 99 mg/dL   BUN 13 8 - 23 mg/dL   Creatinine, Ser 1.45 (H) 0.44 - 1.00 mg/dL   Calcium 9.4 8.9 - 10.3 mg/dL   Total Protein 6.7 6.5 - 8.1 g/dL   Albumin 2.4 (L) 3.5 - 5.0 g/dL   AST 57 (H) 15 - 41 U/L   ALT 34 0 - 44 U/L   Alkaline Phosphatase 176 (H) 38 - 126 U/L   Total Bilirubin  0.4 0.3 - 1.2 mg/dL   GFR calc non Af Amer 35 (L) >60 mL/min   GFR calc Af Amer 40 (L) >60 mL/min   Anion gap 9 5 - 15    Comment: Performed at [facility], [address] [place], [place] [address]  CBC with Differential     Status: Abnormal   Collection Time: 08/21/18  1:05 PM  Result Value Ref Range   WBC 8.2 4.0 - 10.5 K/uL   RBC 3.76 (L) 3.87 - 5.11 MIL/uL   Hemoglobin 9.9 (L) 12.0 - 15.0 g/dL   HCT 30.9 (L) 36.0 - 46.0 %   MCV 82.2 80.0 - 100.0 fL   MCH 26.3 26.0 - 34.0 pg   MCHC 32.0 30.0 - 36.0 g/dL   RDW 19.7 (H) 11.5 - 15.5 %   Platelets 470 (H) 150 - 400 K/uL   nRBC 0.0 0.0 - 0.2 %   Neutrophils Relative % 74 %   Neutro Abs 6.1 1.7 - 7.7 K/uL   Lymphocytes Relative 15 %   Lymphs Abs 1.3 0.7 - 4.0 K/uL   Monocytes Relative 8 %   Monocytes Absolute 0.7 0.1 - 1.0 K/uL   Eosinophils Relative 2 %   Eosinophils Absolute 0.1 0.0 - 0.5 K/uL   Basophils Relative 0 %   Basophils Absolute 0.0 0.0 - 0.1 K/uL   Immature Granulocytes 1 %   Abs Immature Granulocytes 0.04 0.00 - 0.07 K/uL    Comment: Performed at [facility], [address] [place], [place] [address]  Urinalysis, Complete w Microscopic     Status: Abnormal   Collection Time: 08/21/18  1:05 PM  Result Value Ref Range   Color, Urine YELLOW (A) YELLOW   APPearance CLOUDY (A) CLEAR   Specific Gravity, Urine 1.014 1.005 - 1.030   pH 5.0 5.0 - 8.0   Glucose, UA NEGATIVE NEGATIVE mg/dL   Hgb urine dipstick NEGATIVE NEGATIVE   Bilirubin Urine NEGATIVE NEGATIVE   Ketones, ur NEGATIVE NEGATIVE mg/dL   Protein, ur NEGATIVE NEGATIVE mg/dL   Nitrite NEGATIVE NEGATIVE   Leukocytes,Ua MODERATE (A) NEGATIVE   RBC / HPF 0-5 0 - 5 RBC/hpf   WBC, UA >50 (H) 0 - 5 WBC/hpf   Bacteria, UA MANY (A) NONE SEEN   Squamous Epithelial / LPF NONE SEEN 0 - 5   WBC Clumps PRESENT    Mucus PRESENT    Hyaline Casts, UA PRESENT     Comment: Performed at [facility], [address] [place], [place]  [address]  Blood gas, venous     Status: Abnormal   Collection Time: 08/21/18  1:32 PM  Result Value Ref Range   pH, Ven 7.42 7.250 - 7.430   pCO2, Ven 42 (L) 44.0 - 60.0 mmHg   pO2, Ven 76.0 (H) 32.0 - 45.0 mmHg   Bicarbonate 27.2 20.0 - 28.0 mmol/L   Acid-Base Excess 2.4 (H) 0.0 - 2.0 mmol/L   O2 Saturation 95.3 %   Patient temperature 37.0    Collection site VEIN    Sample type VEIN     Comment: Performed at [facility], [address] [place], [place] [address]  SARS Coronavirus 2 (CEPHEID- Performed in [place] hospital lab), Hosp Order     Status: None   Collection Time: 08/21/18  1:32 PM   Specimen: Urine, Catheterized; Nasopharyngeal  Result Value Ref Range   SARS Coronavirus 2 NEGATIVE NEGATIVE    Comment: (NOTE) If result is NEGATIVE SARS-CoV-2 target nucleic acids are NOT DETECTED. The SARS-CoV-2 RNA is generally detectable in upper and lower  respiratory specimens during the acute phase of infection. The lowest  concentration of SARS-CoV-2 viral copies this assay can detect is 250  copies / mL. A negative result does not preclude SARS-CoV-2 infection  and should not be used as the sole basis for treatment or other  patient management decisions.  A negative result may occur with  improper specimen collection / handling, submission of specimen other  than nasopharyngeal swab, presence of viral mutation(s) within the  areas targeted by this assay, and inadequate number of viral copies  (<250 copies / mL). A negative result must be combined with clinical  observations, patient history, and epidemiological information. If result is POSITIVE SARS-CoV-2 target nucleic acids are DETECTED. The SARS-CoV-2 RNA is generally detectable in upper and lower  respiratory specimens dur ing the acute phase of infection.  Positive  results are indicative of active infection with SARS-CoV-2.  Clinical  correlation with patient history and other diagnostic information is   necessary to determine patient infection status.  Positive results do  not rule out bacterial infection or co-infection with other viruses. If result is PRESUMPTIVE POSTIVE SARS-CoV-2 nucleic acids MAY BE PRESENT.   A presumptive positive result was obtained on the submitted specimen  and confirmed on repeat testing.  While 2019 novel coronavirus  (SARS-CoV-2) nucleic acids may be present in the submitted sample  additional confirmatory testing may be necessary for epidemiological  and / or clinical management purposes  to differentiate between  SARS-CoV-2 and other Sarbecovirus currently known to infect humans.  If clinically indicated additional testing with an alternate test  methodology [phone]) is advised. The SARS-CoV-2 RNA is generally  detectable in upper and lower respiratory sp ecimens during the acute  phase of infection. The expected result is Negative. Fact Sheet for Patients:  StrictlyIdeas.no Fact Sheet for Healthcare Providers: BankingDealers.co.za This test is not yet approved or cleared by the [place] FDA and has been authorized for detection and/or diagnosis of SARS-CoV-2 by FDA under an Emergency Use Authorization (EUA).  This EUA will remain in effect (meaning this test can be used) for the duration of the COVID-19 declaration under Section 564(b)(1) of the Act, 21 U.S.C. section 360bbb-3(b)(1), unless the authorization is terminated or revoked sooner. Performed at [facility], [address] [place], [place] [address]    Ct Head Wo Contrast  Result Date: 08/21/2018 CLINICAL DATA:  Altered mental status. Worsening shortness of breath over  the past 3 days. EXAM: CT HEAD WITHOUT CONTRAST TECHNIQUE: Contiguous axial images were obtained from the base of the skull through the vertex without intravenous contrast. COMPARISON:  Head CT scan 02/28/2018 FINDINGS: Brain: No evidence of acute infarction,  hemorrhage, hydrocephalus, extra-axial collection or mass lesion/mass effect. Mild atrophy and chronic microvascular ischemic change noted. Vascular: No hyperdense vessel or unexpected calcification. Skull: Intact.  No focal lesion. Sinuses/Orbits: Right mastoid effusion is unchanged. Otherwise negative. Other: None. IMPRESSION: No acute abnormality. Mild atrophy and chronic microvascular ischemic change. Right mastoid effusion, unchanged. Electronically Signed   By: Inge Rise M.D.   On: 08/21/2018 14:09   Dg Chest Portable 1 View  Result Date: 08/21/2018 CLINICAL DATA:  Acute mental status change. EXAM: PORTABLE CHEST 1 VIEW COMPARISON:  July 15, 2018 FINDINGS: The heart size and mediastinal contours are within normal limits. Both lungs are clear. The visualized skeletal structures are unremarkable. IMPRESSION: No active disease. Electronically Signed   By: Dorise Bullion III M.D   On: 08/21/2018 13:41    Pending Labs Unresulted Labs (From admission, onward)    Start     Ordered   08/21/18 1314  Blood culture (routine x 2)  BLOOD CULTURE X 2,   STAT    Question:  Patient immune status  Answer:  Normal   08/21/18 1314   08/21/18 1314  Brain natriuretic peptide  Once,   STAT     08/21/18 1314   08/21/18 1257  Lactic acid, plasma  Now then every 2 hours,   STAT     08/21/18 1258          Vitals/Pain Today's Vitals   08/21/18 1513 08/21/18 1530 08/21/18 1533 08/21/18 1600  BP:   119/81 101/77  Pulse:  (!) 102 (!) 104 (!) 102  Resp:  (!) 25 (!) 23 (!) 24  Temp:      TempSrc:      SpO2:  99% 99% 100%  Weight: 240 lb 4.8 oz (109 kg)       Isolation Precautions Airborne and Contact precautions  Medications Medications  sodium chloride flush (NS) 0.9 % injection 3 mL (has no administration in time range)  acetaminophen (TYLENOL) CR tablet 650 mg (has no administration in time range)  colchicine tablet 0.6 mg (has no administration in time range)  atorvastatin (LIPITOR) tablet  80 mg (has no administration in time range)  hydrALAZINE (APRESOLINE) tablet 25 mg (has no administration in time range)  FLUoxetine (PROZAC) capsule 40 mg (has no administration in time range)  LORazepam (ATIVAN) injection 1 mg (has no administration in time range)  linagliptin (TRADJENTA) tablet 5 mg (has no administration in time range)  docusate sodium (COLACE) capsule 100 mg (has no administration in time range)  meclizine (ANTIVERT) tablet 25 mg (has no administration in time range)  pantoprazole (PROTONIX) EC tablet 40 mg (has no administration in time range)  clopidogrel (PLAVIX) tablet 75 mg (has no administration in time range)  vitamin B-12 (CYANOCOBALAMIN) tablet 1,000 mcg (has no administration in time range)  gabapentin (NEURONTIN) capsule 300 mg (has no administration in time range)  levETIRAcetam (KEPPRA XR) 24 hr tablet 500 mg (has no administration in time range)  cholecalciferol (VITAMIN D) tablet 1,000 Units (has no administration in time range)  multivitamin with minerals tablet 1 tablet (has no administration in time range)  loratadine (CLARITIN) tablet 10 mg (has no administration in time range)  ipratropium-albuterol (DUONEB) 0.5-2.5 (3) MG/3ML nebulizer solution 3 mL (has no  administration in time range)  mupirocin ointment (BACTROBAN) 2 % 1 application (has no administration in time range)  nystatin (MYCOSTATIN) 100000 UNIT/ML suspension 500,000 Units (has no administration in time range)  heparin injection 5,000 Units (has no administration in time range)  piperacillin-tazobactam (ZOSYN) IVPB 3.375 g (0 g Intravenous Stopped 08/21/18 1627)    Mobility non-ambulatory     Focused Assessments Cardiac Assessment Handoff:    Lab Results  Component Value Date   CKTOTAL 59 08/27/2017   CKMB 0.5 01/22/2013   TROPONINI <0.03 07/16/2018   No results found for: DDIMER Does the Patient currently have chest pain? No     R Recommendations: See Admitting Provider  Note  Report given to:   Additional Notes: none

## 2018-08-21 NOTE — ED Notes (Signed)
Pts husband updated this RN that pt had a stroke 1 year ago and is normally oriented to her name, year and her birthday but has some confusion at baseline.

## 2018-08-21 NOTE — H&P (Signed)
[facility] [place] NAME: Katherine [name]    MR#:  [id number]  DATE OF BIRTH:  30-Jun-1940  DATE OF ADMISSION:  08/21/2018  PRIMARY CARE PHYSICIAN: Valerie Roys, DO   REQUESTING/REFERRING PHYSICIAN: Merlyn Lot, MD  CHIEF COMPLAINT:   Chief Complaint  Patient presents with  . Shortness of Breath  . Weakness    HISTORY OF PRESENT ILLNESS:  78 y.o. female with pertinent past medical history of CHF, CKD stage III, COPD, diabetes mellitus, GERD, hypertension, hyperlipidemia, seizure, stroke, tobacco abuse, anxiety and depression presenting to the ED from [facility] with complaints of increased shortness of breath since Monday and altered mental status.  Staff at [facility], patient has had a cough since Monday with a negative chest x-ray.  Breathing is worsened and patient has been grunting and having snoring respirations since last night.  Per staff, patient is no longer answering questions which per them is a change from patient's baseline.  Patient is normally alert and oriented x4 and talkative without difficulty or shortness of breath.  Patient also complains of nausea vomiting and diarrhea since Monday.  Due to history of COPD she is on chronic 2 L oxygen via nasal cannula.  Today they noticed that she was less responsive therefore EMS was called and patient transferred to the ED.  On arrival to the ED, she was noted with low grade fever, blood pressure 110/86 mm Hg and pulse rate 114 beats/min. There were no focal neurological deficits; she was awake but appeared encephalopathic.  Initial pertinent labs showed kidney functions at baseline, slightly elevated AST 57, WBC 8.2.  Chest x-ray shows no active cardiopulmonary process.  Noncontrast CT head negative for acute intracranial abnormality.  Patient was recently diagnosed with UTI with positive cultures for ESBL E. coli.  She was therefore started on Zosyn as it  sensitive to Zosyn pending urine and blood cultures.  Patient is admitted under hospitalist service for further management.  PAST MEDICAL HISTORY:   Past Medical History:  Diagnosis Date  . Angioedema   . Anxiety and depression   . CHF (congestive heart failure) ([place])   . Chronic constipation   . Chronic kidney disease    stage 3  . COPD (chronic obstructive pulmonary disease) ([place])   . Diabetes mellitus without complication ([place])   . Gallstones   . GERD (gastroesophageal reflux disease)   . Hypertension   . Left ventricular hypertrophy   . Osteoarthritis   . Prurigo nodularis   . Seizures ([place])   . Stroke ([place])   . Tobacco abuse   . Vitamin D deficiency disease     PAST SURGICAL HISTORY:   Past Surgical History:  Procedure Laterality Date  . CHOLECYSTECTOMY    . 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   Procedure: TOTAL KNEE ARTHROPLASTY;  Surgeon: Hessie Knows, MD;  Location: [facility] ORS;  Service: Orthopedics;  Laterality: Right;   SOCIAL HISTORY:   Social History   Tobacco Use  . Smoking status: Former Smoker    Packs/day: 0.50    Years: 60.00    Pack years: 30.00    Types: Cigarettes  . Smokeless tobacco: Never Used  . Tobacco comment: quite 72mo ago-03/26/18  Substance Use Topics  . Alcohol use: No    Alcohol/week: 0.0 standard drinks    Comment: rare   FAMILY HISTORY:   Family History  Problem Relation Age of  Onset  . Alcohol abuse Mother   . Sickle cell anemia Daughter   . Hypertension Son   . Cancer Neg Hx   . COPD Neg Hx   . Diabetes Neg Hx   . Heart disease Neg Hx   . Stroke Neg Hx     DRUG ALLERGIES:   Allergies  Allergen Reactions  . Bee Venom Swelling  . Enalapril Maleate Swelling    REVIEW OF SYSTEMS:   ROS Unable to obtain from patient due to current altered mental status however this is improving. MEDICATIONS AT HOME:   Prior to Admission medications   Medication Sig Start Date End Date  Taking? Authorizing Provider  albuterol (ACCUNEB) 0.63 MG/3ML nebulizer solution Take 3 mLs by nebulization every 8 (eight) hours as needed for wheezing.    Yes [provider]  atorvastatin (LIPITOR) 80 MG tablet Take 80 mg by mouth every evening.    Yes [provider]  cetirizine (ZYRTEC) 10 MG tablet Take 5 mg by mouth at bedtime.   Yes [provider]  cholecalciferol (VITAMIN D) 1000 units tablet Take 1,000 Units by mouth daily.   Yes [provider]  clopidogrel (PLAVIX) 75 MG tablet Take 75 mg by mouth daily.   Yes [provider]  diclofenac sodium (VOLTAREN) 1 % GEL Apply 2 g topically every 12 (twelve) hours as needed (for left knee pain).    Yes [provider]  docusate sodium (COLACE) 100 MG capsule Take 100 mg by mouth 2 (two) times daily.   Yes [provider]  FLUoxetine (PROZAC) 40 MG capsule Take 40 mg by mouth daily.    Yes [provider]  Fluticasone-Umeclidin-Vilant 100-62.5-25 MCG/INH AEPB Inhale 1 puff into the lungs daily.   Yes [provider]  gabapentin (NEURONTIN) 300 MG capsule Take 300 mg by mouth at bedtime.   Yes [provider]  hydrALAZINE (APRESOLINE) 25 MG tablet Take 25 mg by mouth 3 (three) times daily. 06/19/18  Yes [provider]  ipratropium (ATROVENT) 0.03 % nasal spray Place 2 sprays into both nostrils 2 (two) times daily.    Yes [provider]  ipratropium-albuterol (DUONEB) 0.5-2.5 (3) MG/3ML SOLN Take 3 mLs by nebulization 3 (three) times daily. 07/17/18  Yes Gladstone Lighter, MD  levETIRAcetam (KEPPRA XR) 500 MG 24 hr tablet Take 1 tablet (500 mg total) by mouth daily. 11/19/17  Yes Venancio Poisson, NP  linagliptin (TRADJENTA) 5 MG TABS tablet Take 5 mg by mouth daily.   Yes [provider]  Multiple Vitamin (MULTIVITAMIN WITH MINERALS) TABS tablet Take 1 tablet by mouth daily.   Yes [provider]  mupirocin ointment  (BACTROBAN) 2 % Place 1 application into the nose 2 (two) times daily. 06/13/18  Yes Wieting, Richard, MD  nystatin (MYCOSTATIN) 100000 UNIT/ML suspension Take 5 mLs (500,000 Units total) by mouth 4 (four) times daily. 06/13/18  Yes Wieting, Richard, MD  pantoprazole (PROTONIX) 40 MG tablet Take 1 tablet (40 mg total) by mouth daily. 04/29/18  Yes Bettey Costa, MD  vitamin B-12 1000 MCG tablet Take 1 tablet (1,000 mcg total) by mouth daily. 04/30/18  Yes Bettey Costa, MD  acetaminophen (TYLENOL) 650 MG CR tablet Take 650 mg by mouth 3 (three) times daily.     [provider]  albuterol (PROVENTIL HFA;VENTOLIN HFA) 108 (90 Base) MCG/ACT inhaler Inhale 2 puffs into the lungs every 4 (four) hours as needed for wheezing or shortness of breath.    [provider]  colchicine 0.6 MG tablet Take 1 tablet (0.6 mg total) by mouth daily. 06/14/18   Loletha Grayer, MD  LORazepam (ATIVAN) 2 MG/ML injection Inject 0.5 mLs (1 mg total) into the muscle daily as needed for seizure. 06/13/18   Loletha Grayer, MD  meclizine (ANTIVERT) 25 MG tablet Take 25 mg by mouth every 12 (twelve) hours as needed for dizziness.     [provider]      VITAL SIGNS:  Blood pressure 119/81, pulse (!) 104, temperature 99.7 F (37.6 C), temperature source Rectal, resp. rate (!) 23, weight 109 kg, SpO2 99 %.  PHYSICAL EXAMINATION:   Physical Exam  GENERAL:  78 y.o.-year-old patient lying in the bed with no acute distress.  EYES: Pupils equal, round, reactive to light and accommodation. No scleral icterus. Extraocular muscles intact.  HEENT: Head atraumatic, normocephalic. Oropharynx and nasopharynx clear.  NECK:  Supple, no jugular venous distention. No thyroid enlargement, no tenderness.  LUNGS: Normal breath sounds bilaterally, no wheezing, rales,rhonchi or crepitation. No use of accessory muscles of respiration.  CARDIOVASCULAR: S1, S2 normal. No murmurs, rubs, or gallops.  ABDOMEN: Soft, nontender,  nondistended. Bowel sounds present. No organomegaly or mass.  EXTREMITIES: No pedal edema, cyanosis, or clubbing. No rash or lesions. + pedal pulses MUSCULOSKELETAL: Normal bulk, and power was 5+ grip and elbow, knee, and ankle flexion and extension bilaterally.  NEUROLOGIC:Alert and oriented x 2. CN 2-12 intact. Sensation to light touch and cold stimuli intact bilaterally. Finger to nose nl. Babinski is downgoing. DTR's (biceps, patellar, and achilles) 2+ and symmetric throughout. Gait not tested due to safety concern. PSYCHIATRIC: The patient is alert and oriented x 1.  SKIN: No obvious rash, lesion, or ulcer.   DATA REVIEWED:  LABORATORY PANEL:   CBC Recent Labs  Lab 08/21/18 1305  WBC 8.2  HGB 9.9*  HCT 30.9*  PLT 470*   ------------------------------------------------------------------------------------------------------------------  Chemistries  Recent Labs  Lab 08/21/18 1305  NA 139  K 4.2  CL 106  CO2 24  GLUCOSE 98  BUN 13  CREATININE 1.45*  CALCIUM 9.4  AST 57*  ALT 34  ALKPHOS 176*  BILITOT 0.4   ------------------------------------------------------------------------------------------------------------------  Cardiac Enzymes No results for input(s): TROPONINI in the last 168 hours. ------------------------------------------------------------------------------------------------------------------  RADIOLOGY:  Ct Head Wo Contrast  Result Date: 08/21/2018 CLINICAL DATA:  Altered mental status. Worsening shortness of breath over the past 3 days. EXAM: CT HEAD WITHOUT CONTRAST TECHNIQUE: Contiguous axial images were obtained from the base of the skull through the vertex without intravenous contrast. COMPARISON:  Head CT scan 02/28/2018 FINDINGS: Brain: No evidence of acute infarction, hemorrhage, hydrocephalus, extra-axial collection or mass lesion/mass effect. Mild atrophy and chronic microvascular ischemic change noted. Vascular: No hyperdense vessel or  unexpected calcification. Skull: Intact.  No focal lesion. Sinuses/Orbits: Right mastoid effusion is unchanged. Otherwise negative. Other: None. IMPRESSION: No acute abnormality. Mild atrophy and chronic microvascular ischemic change. Right mastoid effusion, unchanged. Electronically Signed   By: Inge Rise M.D.   On: 08/21/2018 14:09   Dg Chest Portable 1 View  Result Date: 08/21/2018 CLINICAL DATA:  Acute mental status change. EXAM: PORTABLE CHEST 1 VIEW COMPARISON:  July 15, 2018 FINDINGS: The heart size and mediastinal contours are within normal limits. Both lungs are clear. The visualized skeletal structures are unremarkable. IMPRESSION: No active disease. Electronically Signed   By: Dorise Bullion III M.D   On: 08/21/2018 13:41   EKG:  EKG: unchanged from previous tracings, sinus tachycardia. Vent. rate  108 BPM PR interval * ms QRS duration 73 ms QT/QTc 376/504 ms P-R-T axes 49 -36 30 IMPRESSION AND PLAN:   78 y.o. female with pertinent past medical history of CHF, CKD stage III, COPD, diabetes mellitus, GERD, hypertension, hyperlipidemia, seizure, stroke, tobacco abuse, anxiety and depression presenting to the ED from [facility] with complaints of increased shortness of breath since Monday and altered mental status.  1.  Altered mental status -likely due to UTI, and recently diagnosed with UTI with cultures positive for ESBL E. Coli -Admit to MedSurg unit -CT head reviewed shows no acute intracranial abnormality.  2. Diarrhea -complaints of nausea vomiting and diarrhea with p.o. intake - Check C-diff - GI panel  3.  UTI -UA positive for UTI -Urine culture pending -Start Zosyn due to recent ESBL E. Coli  4.  CKD stage III -BUN/creatinine at baseline -Continue to monitor labs  5. History of CVA -Continue Plavix  6. Seizure Disorder -Continue Keppra  7. COPD - stable -*Continue supplemental oxygen -PRN duo nebs +  8. HLD + Goal LDL<70 - Atorvastatin 80mg   PO qhs  9. HTN = + Goal BP <130/80 -Hydralazine  10. DM = -Continue Tradjenta -Add sliding scale    All the records are reviewed and case discussed with ED provider. Management plans discussed with the patient, family and they are in agreement.  CODE STATUS: FULL  TOTAL TIME TAKING CARE OF THIS PATIENT: 40 minutes.    on 08/21/2018 at 4:22 PM  Rufina Falco, DNP, FNP-BC [facility] Nurse Practitioner Between 7am to 6pm - Pager [phone]  After 6pm go to [email or web address] - password [email or web address] [facility]  Office  [phone]  CC: Primary care physician; Valerie Roys, DO

## 2018-08-21 NOTE — ED Notes (Signed)
ED Provider at bedside. 

## 2018-08-21 NOTE — ED Notes (Signed)
Pt taken to CT scan at this time.

## 2018-08-21 NOTE — Consult Note (Signed)
Pharmacy Antibiotic Note  Katherine [name] is a 78 y.o. female admitted on 08/21/2018 with UTI.  Pharmacy has been consulted for Pip/tazo dosing. Hx of ESBL infection   Plan: Zosyn 3.375g IV q8h (4 hour infusion).  Weight: 240 lb 4.8 oz (109 kg)  Temp (24hrs), Avg:99.7 F (37.6 C), Min:99.7 F (37.6 C), Max:99.7 F (37.6 C)  Recent Labs  Lab 08/21/18 1305  WBC 8.2  CREATININE 1.45*  LATICACIDVEN 1.5    Estimated Creatinine Clearance: 39.8 mL/min (A) (by C-G formula based on SCr of 1.45 mg/dL (H)).    Allergies  Allergen Reactions  . Bee Venom Swelling  . 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 Sensitive to pip/tazo on 07/16/2018  Thank you for allowing pharmacy to be a part of this patient's care.  Oswald Hillock, PharmD, BCPS 08/21/2018 4:43 PM

## 2018-08-22 LAB — C DIFFICILE QUICK SCREEN W PCR REFLEX
C Diff antigen: POSITIVE — AB
C Diff toxin: NEGATIVE

## 2018-08-22 LAB — BLOOD CULTURE ID PANEL (REFLEXED)

## 2018-08-22 LAB — GLUCOSE, CAPILLARY
Glucose-Capillary: 88 mg/dL (ref 70–99)
Glucose-Capillary: 113 mg/dL — ABNORMAL HIGH (ref 70–99)
Glucose-Capillary: 136 mg/dL — ABNORMAL HIGH (ref 70–99)

## 2018-08-22 LAB — CLOSTRIDIUM DIFFICILE BY PCR, REFLEXED: Toxigenic C. Difficile by PCR: NEGATIVE

## 2018-08-22 MED ORDER — SODIUM CHLORIDE 0.9 % IV SOLN
INTRAVENOUS | Status: DC | PRN
  Administered 2018-08-22: 21:00:00 500 mL via INTRAVENOUS

## 2018-08-22 MED ORDER — INSULIN ASPART 100 UNIT/ML ~~LOC~~ SOLN: 0.0000 [IU] | Freq: Every day | SUBCUTANEOUS | Status: DC

## 2018-08-22 MED ORDER — TRAMADOL HCL 50 MG PO TABS
50.0000 mg | ORAL_TABLET | Freq: Four times a day (QID) | ORAL | Status: DC | PRN
  Administered 2018-08-22 – 2018-08-25 (×4): 50 mg via ORAL
  Filled 2018-08-22 (×4): qty 1

## 2018-08-22 MED ORDER — LOPERAMIDE HCL 2 MG PO CAPS
2.0000 mg | ORAL_CAPSULE | Freq: Four times a day (QID) | ORAL | Status: DC | PRN
  Administered 2018-08-22 – 2018-08-25 (×2): 2 mg via ORAL
  Filled 2018-08-22 (×2): qty 1

## 2018-08-22 MED ORDER — INSULIN ASPART 100 UNIT/ML ~~LOC~~ SOLN
0.0000 [IU] | Freq: Three times a day (TID) | SUBCUTANEOUS | Status: DC
  Administered 2018-08-25 (×2): 1 [IU] via SUBCUTANEOUS
  Filled 2018-08-22 (×2): qty 1

## 2018-08-22 NOTE — Plan of Care (Signed)

## 2018-08-22 NOTE — NC FL2 (Signed)
[place] LEVEL OF CARE SCREENING TOOL     IDENTIFICATION  Patient Name: Katherine [name] Birthdate: [birth year] Sex: female Admission Date (Current Location): 08/21/2018  [place] and [place] Number:  [name] [id number] [place] and Address:  [facility], [address], [place], [place] [address]      Provider Number: [id number]  Attending Physician Name and Address:  Bettey Costa, MD  Relative Name and Phone Number:       Current Level of Care: Hospital Recommended Level of Care: [place] Prior Approval Number:    Date Approved/Denied:   PASRR Number: [id number]  Discharge Plan: SNF    Current Diagnoses: Patient Active Problem List   Diagnosis Date Noted  . UTI (urinary tract infection) 08/21/2018  . Chest pain 07/15/2018  . Palliative care encounter 05/10/2018  . Localized edema 05/10/2018  . Shortness of breath 05/10/2018  . Hematemesis 04/28/2018  . Influenza A 04/13/2018  . Acute on chronic congestive heart failure ([place])   . COPD with acute exacerbation ([place]) 02/24/2018  . Chronic diastolic heart failure ([place]) 12/31/2017  . Lymphedema 12/31/2017  . COPD exacerbation ([place]) 11/30/2017  . Urinary tract infection 11/22/2017  . 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[place]) 11/16/2017  . Diabetes mellitus type 2, uncomplicated ([place]) 95/63/8756  . Pressure injury of skin 10/29/2017  . Acute kidney injury superimposed on CKD ([place]) 10/24/2017  . Severe sepsis ([place]) 10/24/2017  . Possible Seizures ([place]) 10/08/2017  . Hyperlipemia 10/08/2017  . 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 Stroke-like episode ([place]) s/p IV tpa 10/04/2017  . Palliative care by specialist   . Elevated rheumatoid factor 09/05/2017  . Frequent hospital admissions 08/22/2017  . Aphasia 06/28/2017  . Hand pain 03/21/2017  . Dry skin 03/21/2017  . Pain in finger of left hand 09/12/2016  . Chronic fatigue 06/12/2016  . Left  knee pain 06/12/2016  . Goals of care, counseling/discussion 03/13/2016  . Primary localized osteoarthritis of right knee 02/08/2016  . OSA (obstructive sleep apnea) 09/16/2015  . Rotator cuff syndrome 09/07/2015  . 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 Sleep disturbance 04/14/2015  . Coronary artery disease 03/14/2015  . 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 Acute respiratory failure with hypoxia ([place]) 09/16/2014  . 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 Anxiety   . Tobacco abuse   . Prurigo nodularis   . GERD (gastroesophageal reflux disease)   . COPD (chronic obstructive pulmonary disease) ([place])   . Osteoarthritis   . Vitamin D deficiency disease   . Chronic constipation   . CKD (chronic kidney disease), stage III ([place])   . Essential hypertension 05/20/2013  . Morbid obesity ([place]) 05/20/2013    Orientation RESPIRATION BLADDER Height & Weight     Self, Place  O2(2 Liters Oxygen.) Incontinent Weight: 240 lb 4.8 oz (109 kg) Height:     BEHAVIORAL SYMPTOMS/MOOD NEUROLOGICAL BOWEL NUTRITION STATUS      Continent Diet(Diet: Heart Healthy/ Carb Modified.)  AMBULATORY STATUS COMMUNICATION OF NEEDS Skin   Extensive Assist Verbally Normal                       Personal Care Assistance Level of Assistance  Bathing, Feeding, Dressing Bathing Assistance: Limited assistance Feeding assistance: Limited assistance Dressing Assistance: Limited assistance     Functional Limitations Info  Sight, Hearing, Speech Sight Info: Adequate Hearing Info: Adequate Speech Info: Adequate    SPECIAL CARE FACTORS FREQUENCY   PICC Line IV ABX  Contractures      Additional Factors Info  Code Status, Allergies Code Status Info: Full Code. Allergies Info: Bee Venom, Enalapril Maleate           Current Medications (08/22/2018):  This is the current hospital active medication list Current Facility-Administered Medications  Medication Dose Route Frequency Provider Last  Rate Last Dose  . acetaminophen (TYLENOL) tablet 650 mg  650 mg Oral TID Lang Snow, NP   650 mg at 08/21/18 2039  . atorvastatin (LIPITOR) tablet 80 mg  80 mg Oral QPM Lang Snow, NP   80 mg at 08/21/18 2040  . cholecalciferol (VITAMIN D3) tablet 1,000 Units  1,000 Units Oral Daily Ouma, Bing Neighbors, NP      . clopidogrel (PLAVIX) tablet 75 mg  75 mg Oral Daily Ouma, Bing Neighbors, NP      . colchicine tablet 0.6 mg  0.6 mg Oral Daily Ouma, Bing Neighbors, NP      . docusate sodium (COLACE) capsule 100 mg  100 mg Oral BID Lang Snow, NP      . FLUoxetine (PROZAC) capsule 40 mg  40 mg Oral Daily Ouma, Bing Neighbors, NP      . gabapentin (NEURONTIN) capsule 300 mg  300 mg Oral QHS Lang Snow, NP   300 mg at 08/21/18 2047  . heparin injection 5,000 Units  5,000 Units Subcutaneous Q8H Lang Snow, NP   5,000 Units at 08/22/18 4193  . hydrALAZINE (APRESOLINE) tablet 25 mg  25 mg Oral TID Lang Snow, NP   25 mg at 08/21/18 2049  . ipratropium-albuterol (DUONEB) 0.5-2.5 (3) MG/3ML nebulizer solution 3 mL  3 mL Nebulization TID Lang Snow, NP   3 mL at 08/22/18 0806  . levETIRAcetam (KEPPRA XR) 24 hr tablet 500 mg  500 mg Oral Daily Ouma, Bing Neighbors, NP      . linagliptin (TRADJENTA) tablet 5 mg  5 mg Oral Daily Ouma, Bing Neighbors, NP      . loratadine (CLARITIN) tablet 10 mg  10 mg Oral Daily Ouma, Bing Neighbors, NP      . LORazepam (ATIVAN) injection 1 mg  1 mg Intramuscular Daily PRN Lang Snow, NP      . meclizine (ANTIVERT) tablet 25 mg  25 mg Oral Q12H PRN Lang Snow, NP      . multivitamin with minerals tablet 1 tablet  1 tablet Oral Daily Ouma, Bing Neighbors, NP      . mupirocin ointment (BACTROBAN) 2 % 1 application  1 application Nasal BID Lang Snow, NP   1 application at 79/02/40 2049  . nystatin (MYCOSTATIN) 100000 UNIT/ML  suspension 500,000 Units  5 mL Oral QID Lang Snow, NP   500,000 Units at 08/21/18 2049  . pantoprazole (PROTONIX) EC tablet 40 mg  40 mg Oral Daily Ouma, Bing Neighbors, NP      . piperacillin-tazobactam (ZOSYN) IVPB 3.375 g  3.375 g Intravenous Q8H Lang Snow, NP 12.5 mL/hr at 08/22/18 0507 3.375 g at 08/22/18 0507  . sodium chloride flush (NS) 0.9 % injection 3 mL  3 mL Intravenous Once Merlyn Lot, MD      . vitamin B-12 (CYANOCOBALAMIN) tablet 1,000 mcg  1,000 mcg Oral Daily Lang Snow, NP         Discharge Medications: Please see discharge summary for a list of discharge medications.  Relevant Imaging Results:  Relevant Lab Results:   Additional Information SSN: [id number]  Olly Shiner, Veronia Beets, LCSW

## 2018-08-22 NOTE — Progress Notes (Signed)
[place] at [place] NAME: Katherine [name]    MR#:  [id number]  DATE OF BIRTH:  [birth year]  SUBJECTIVE:   patient here with AMS from [place]:    Patient poor historian  Tolerating Diet: yes      DRUG ALLERGIES:   Allergies  Allergen Reactions  . Bee Venom Swelling  . Enalapril Maleate Swelling    VITALS:  Blood pressure 113/77, pulse (!) 106, temperature 98.2 F (36.8 C), resp. rate 18, weight 109 kg, SpO2 99 %.  PHYSICAL EXAMINATION:  Constitutional: Appears well-developed and well-nourished. No distress. HENT: Normocephalic. [name] Oropharynx is clear and moist.  Eyes: Conjunctivae and EOM are normal. PERRLA, no scleral icterus.  Neck: Normal ROM. Neck supple. No JVD. No tracheal deviation. CVS: RRR, S1/S2 +, no murmurs, no gallops, no carotid bruit.  Pulmonary: Effort and breath sounds normal, no stridor, rhonchi, wheezes, rales.  Abdominal: Soft. BS +,  no distension, tenderness, rebound or guarding.  Musculoskeletal: Normal range of motion. No edema and no tenderness.  Neuro: Alert. CN 2-12 grossly intact. No focal deficits. Skin: Skin is warm and dry. No rash noted. Psychiatric: Some confusion noted     LABORATORY PANEL:   CBC Recent Labs  Lab 08/21/18 1305  WBC 8.2  HGB 9.9*  HCT 30.9*  PLT 470*   ------------------------------------------------------------------------------------------------------------------  Chemistries  Recent Labs  Lab 08/21/18 1305  NA 139  K 4.2  CL 106  CO2 24  GLUCOSE 98  BUN 13  CREATININE 1.45*  CALCIUM 9.4  AST 57*  ALT 34  ALKPHOS 176*  BILITOT 0.4   ------------------------------------------------------------------------------------------------------------------  Cardiac Enzymes No results for input(s): TROPONINI in the last 168  hours. ------------------------------------------------------------------------------------------------------------------  RADIOLOGY:  Ct Head Wo Contrast  Result Date: 08/21/2018 CLINICAL DATA:  Altered mental status. Worsening shortness of breath over the past 3 days. EXAM: CT HEAD WITHOUT CONTRAST TECHNIQUE: Contiguous axial images were obtained from the base of the skull through the vertex without intravenous contrast. COMPARISON:  Head CT scan 02/28/2018 FINDINGS: Brain: No evidence of acute infarction, hemorrhage, hydrocephalus, extra-axial collection or mass lesion/mass effect. Mild atrophy and chronic microvascular ischemic change noted. Vascular: No hyperdense vessel or unexpected calcification. Skull: Intact.  No focal lesion. Sinuses/Orbits: Right mastoid effusion is unchanged. Otherwise negative. Other: None. IMPRESSION: No acute abnormality. Mild atrophy and chronic microvascular ischemic change. Right mastoid effusion, unchanged. Electronically Signed   By: Inge Rise M.D.   On: 08/21/2018 14:09   Dg Chest Portable 1 View  Result Date: 08/21/2018 CLINICAL DATA:  Acute mental status change. EXAM: PORTABLE CHEST 1 VIEW COMPARISON:  July 15, 2018 FINDINGS: The heart size and mediastinal contours are within normal limits. Both lungs are clear. The visualized skeletal structures are unremarkable. IMPRESSION: No active disease. Electronically Signed   By: Dorise Bullion III M.D   On: 08/21/2018 13:41     ASSESSMENT AND PLAN:   78 year old female with history of chronic diastolic heart failure, CVA and seizure disorder presents from [place] care due to altered mental status.  1.  Acute metabolic encephalopathy in setting of UTI: Patient with history of ESBL UTI back in June. Mental status is slowly improving  2.  UTI: I ordered urine culture.  Blood cultures 1 out of 4 bottles showing gram-positive cocci likely contaminant. Continue Zosyn as per sensitivities per last urine  culture. Patient may need PICC line with IV antibiotics pending results of urine culture.  3.  Chronic kidney disease stage III: Creatinine is at baseline  4.  COPD without signs of exacerbation  5.  History of CVA: Continue Plavix and statin.  6.  Essential hypertension: Continue hydralazine  7.  Seizure disorder: Continue Keppra  8.  Diabetes: Continue Tradjenta with sliding scale  9.  Depression: Continue Prozac  10.  Chronic diastolic heart failure without signs of exacerbation      CODE STATUS: full  TOTAL TIME TAKING CARE OF THIS PATIENT: 28 minutes.     POSSIBLE D/C tomorrow, DEPENDING ON CLINICAL CONDITION.   Bettey Costa M.D on 08/22/2018 at 11:39 AM  Between 7am to 6pm - Pager - [phone] After 6pm go to [email or web address] - password [email or web address] [facility]  Office  [phone]  CC: Primary care physician; Valerie Roys, DO  Note: This dictation was prepared with Dragon dictation along with smaller phrase technology. Any transcriptional errors that result from this process are unintentional.

## 2018-08-22 NOTE — Progress Notes (Signed)
PHARMACY - PHYSICIAN COMMUNICATION CRITICAL VALUE ALERT - BLOOD CULTURE IDENTIFICATION (BCID)  Katherine [name] is an 78 y.o. female who presented to [facility] on 08/21/2018 with a chief complaint of UTI  Assessment:  Patient admitted for UTI, h/o of ESBL UTI in June. Blood culture results show 1 of 4 bottles with GPC, BCID identified staph species MEC A +. Likely represents contiminant.  Name of physician (or Provider) Contacted: Mody  Current antibiotics: Zosyn  Changes to prescribed antibiotics recommended:  Continue with current antibiotics, no changes at present.  Results for orders placed or performed during the hospital encounter of 08/21/18  Blood Culture ID Panel (Reflexed) (Collected: 08/21/2018  1:32 PM)  Result Value Ref Range   Enterococcus species NOT DETECTED NOT DETECTED   Listeria monocytogenes NOT DETECTED NOT DETECTED   Staphylococcus species DETECTED (A) NOT DETECTED   Staphylococcus aureus (BCID) NOT DETECTED NOT DETECTED   Methicillin resistance DETECTED (A) NOT DETECTED   Streptococcus species NOT DETECTED NOT DETECTED   Streptococcus agalactiae NOT DETECTED NOT DETECTED   Streptococcus pneumoniae NOT DETECTED NOT DETECTED   Streptococcus pyogenes NOT DETECTED NOT DETECTED   Acinetobacter baumannii NOT DETECTED NOT DETECTED   Enterobacteriaceae species NOT DETECTED NOT DETECTED   Enterobacter cloacae complex NOT DETECTED NOT DETECTED   Escherichia coli NOT DETECTED NOT DETECTED   Klebsiella oxytoca NOT DETECTED NOT DETECTED   Klebsiella pneumoniae NOT DETECTED NOT DETECTED   Proteus species NOT DETECTED NOT DETECTED   Serratia marcescens NOT DETECTED NOT DETECTED   Haemophilus influenzae NOT DETECTED NOT DETECTED   Neisseria meningitidis NOT DETECTED NOT DETECTED   Pseudomonas aeruginosa NOT DETECTED NOT DETECTED   Candida albicans NOT DETECTED NOT DETECTED   Candida glabrata NOT DETECTED NOT DETECTED   Candida krusei NOT DETECTED NOT DETECTED   Candida  parapsilosis NOT DETECTED NOT DETECTED   Candida tropicalis NOT DETECTED NOT DETECTED    Paulina Fusi, PharmD, BCPS 08/22/2018 1:19 PM

## 2018-08-22 NOTE — TOC Initial Note (Signed)
Transition of Care [facility]) - Initial/Assessment Note    Patient Details  Name: Katherine [name] MRN: [id number] Date of Birth: 25-Jun-1940  Transition of Care [facility]) CM/SW Contact:    Jasen Hartstein, Lenice Llamas Phone Number: [phone]  08/22/2018, 10:35 AM  Clinical Narrative: Clinical Social Worker (CSW) reviewed chart and noted that patient is from a facility. Per [facility] admissions coordinator at [facility], patient is a long term care resident at [facility] and can return when medically stable. FL2 complete. CSW attempted to meet with patient however per chart she is not alert and oriented and she is on isolation. CSW attempted to contact patient's husband [name] however he did not answer and his voicemail was full. CSW will continue to follow and assist as needed.                  Expected Discharge Plan: Skilled Nursing Facility Barriers to Discharge: Continued Medical Work up   Patient Goals and CMS Choice        Expected Discharge Plan and Services Expected Discharge Plan: [place] In-house Referral: Clinical Social Work Discharge Planning Services: CM Consult   Living arrangements for the past 2 months: [place] Expected Discharge Date: 08/23/18                                    Prior Living Arrangements/Services Living arrangements for the past 2 months: [place] Lives with:: Facility Resident Patient language and need for interpreter reviewed:: No        Need for Family Participation in Patient Care: Yes (Comment) Care giver support system in place?: Yes (comment)   Criminal Activity/Legal Involvement Pertinent to Current Situation/Hospitalization: No - Comment as needed  Activities of Daily Living Home Assistive Devices/Equipment: Wheelchair ADL Screening (condition at time of admission) Patient's cognitive ability adequate to safely complete daily activities?: Yes Is the patient deaf or have  difficulty hearing?: No Does the patient have difficulty seeing, even when wearing glasses/contacts?: No Does the patient have difficulty concentrating, remembering, or making decisions?: Yes Patient able to express need for assistance with ADLs?: Yes Does the patient have difficulty dressing or bathing?: Yes Independently performs ADLs?: No Communication: Independent Dressing (OT): Dependent Is this a change from baseline?: Pre-admission baseline Grooming: Dependent Is this a change from baseline?: Pre-admission baseline Feeding: Dependent, Independent Is this a change from baseline?: Pre-admission baseline Bathing: Dependent Is this a change from baseline?: Pre-admission baseline Toileting: Dependent Is this a change from baseline?: Pre-admission baseline In/Out Bed: Dependent Is this a change from baseline?: Pre-admission baseline Walks in Home: Dependent Is this a change from baseline?: Pre-admission baseline Does the patient have difficulty walking or climbing stairs?: Yes Weakness of Legs: Both Weakness of Arms/Hands: Both  Permission Sought/Granted                  Emotional Assessment Appearance:: Appears stated age   Affect (typically observed): Calm Orientation: : Oriented to Self, Oriented to Place, Fluctuating Orientation (Suspected and/or reported Sundowners) Alcohol / Substance Use: Not Applicable Psych Involvement: No (comment)  Admission diagnosis:  ESBL (extended spectrum beta-lactamase) producing bacteria infection [A49.9, Z16.12] Acute encephalopathy [G93.40] Patient Active Problem List   Diagnosis Date Noted  . UTI (urinary tract infection) 08/21/2018  . Chest pain 07/15/2018  . Palliative care encounter 05/10/2018  . Localized edema 05/10/2018  . Shortness of breath 05/10/2018  . Hematemesis  04/28/2018  . Influenza A 04/13/2018  . Acute on chronic congestive heart failure ([place])   . COPD with acute exacerbation ([place]) 02/24/2018  . Chronic  diastolic heart failure ([place]) 12/31/2017  . Lymphedema 12/31/2017  . COPD exacerbation ([place]) 11/30/2017  . Urinary tract infection 11/22/2017  . 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[place]) 11/16/2017  . Diabetes mellitus type 2, uncomplicated ([place]) 35/70/1779  . Pressure injury of skin 10/29/2017  . Acute kidney injury superimposed on CKD ([place]) 10/24/2017  . Severe sepsis ([place]) 10/24/2017  . Possible Seizures ([place]) 10/08/2017  . Hyperlipemia 10/08/2017  . 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 Stroke-like episode ([place]) s/p IV tpa 10/04/2017  . Palliative care by specialist   . Elevated rheumatoid factor 09/05/2017  . Frequent hospital admissions 08/22/2017  . Aphasia 06/28/2017  . Hand pain 03/21/2017  . Dry skin 03/21/2017  . Pain in finger of left hand 09/12/2016  . Chronic fatigue 06/12/2016  . Left knee pain 06/12/2016  . Goals of care, counseling/discussion 03/13/2016  . Primary localized osteoarthritis of right knee 02/08/2016  . OSA (obstructive sleep apnea) 09/16/2015  . Rotator cuff syndrome 09/07/2015  . 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 Sleep disturbance 04/14/2015  . Coronary artery disease 03/14/2015  . 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 Acute respiratory failure with hypoxia ([place]) 09/16/2014  . 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 Anxiety   . Tobacco abuse   . Prurigo nodularis   . GERD (gastroesophageal reflux disease)   . COPD (chronic obstructive pulmonary disease) ([place])   . Osteoarthritis   . Vitamin D deficiency disease   . Chronic constipation   . CKD (chronic kidney disease), stage III ([place])   . Essential hypertension 05/20/2013  . Morbid obesity ([place]) 05/20/2013   PCP:  Valerie Roys, DO Pharmacy:   [facility] [phone] [address] [place] [address] Phone: [phone] Fax: [phone]     Social Determinants of Health (SDOH) Interventions     Readmission Risk Interventions Readmission Risk Prevention Plan 07/16/2018 04/29/2018 04/29/2018  Transportation Screening Complete Complete Complete  Medication Review Press photographer) Complete Complete Complete  PCP or Specialist appointment within 3-5 days of discharge Complete Not Complete -  PCP/Specialist Appt Not Complete comments - Patient to return to SNF, and they will make appointments  -  [place] or Home Care Consult Complete (No Data) -  SW Recovery Care/Counseling Consult Not Complete (No Data) -  SW Consult Not Complete Comments na - -  Palliative Care Screening Not Applicable Complete -  Medication Reconcilation ([place] Complete Complete -  Some recent data might be hidden

## 2018-08-23 LAB — HEMOGLOBIN A1C
Hgb A1c MFr Bld: 5.5 % (ref 4.8–5.6)
Mean Plasma Glucose: 111 mg/dL

## 2018-08-23 LAB — GLUCOSE, CAPILLARY
Glucose-Capillary: 87 mg/dL (ref 70–99)
Glucose-Capillary: 100 mg/dL — ABNORMAL HIGH (ref 70–99)
Glucose-Capillary: 96 mg/dL (ref 70–99)
Glucose-Capillary: 147 mg/dL — ABNORMAL HIGH (ref 70–99)

## 2018-08-23 LAB — MRSA PCR SCREENING: MRSA by PCR: POSITIVE — AB

## 2018-08-23 NOTE — Consult Note (Signed)
Pharmacy Antibiotic Note  Katherine [name] is a 78 y.o. female admitted on 08/21/2018 with UTI.  Pharmacy has been consulted for Pip/tazo dosing. Hx of ESBL infection that was sensitive to Pip/tazo  Plan: Zosyn 3.375g IV q8h (4 hour infusion).  Weight: 240 lb 4.8 oz (109 kg)  Temp (24hrs), Avg:97.9 F (36.6 C), Min:97.7 F (36.5 C), Max:98 F (36.7 C)  Recent Labs  Lab 08/21/18 1305  WBC 8.2  CREATININE 1.45*  LATICACIDVEN 1.5    Estimated Creatinine Clearance: 39.8 mL/min (A) (by C-G formula based on SCr of 1.45 mg/dL (H)).    Allergies  Allergen Reactions  . Bee Venom Swelling  . 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 Sensitive to pip/tazo on 07/16/2018  Thank you for allowing pharmacy to be a part of this patient's care.  Vira Blanco, PharmD, BCPS 08/23/2018 3:49 PM

## 2018-08-23 NOTE — Progress Notes (Signed)
[place] at [place] NAME: Katherine [name]    MR#:  [id number]  DATE OF BIRTH:  15-May-1940  SUBJECTIVE:   patient here with AMS from [place] Status is improved.  Patient reports that she does not ambulate at [place] care.  She is mostly bedbound.  REVIEW OF SYSTEMS:    Review of Systems  Constitutional: Positive for malaise/fatigue. Negative for chills and fever.  HENT: Negative.  Negative for ear discharge, ear pain, hearing loss, nosebleeds and sore throat.   Eyes: Negative.  Negative for blurred vision and pain.  Respiratory: Negative.  Negative for cough, hemoptysis, shortness of breath and wheezing.   Cardiovascular: Negative.  Negative for chest pain, palpitations and leg swelling.  Gastrointestinal: Negative.  Negative for abdominal pain, blood in stool, diarrhea, nausea and vomiting.  Genitourinary: Negative.  Negative for dysuria.  Musculoskeletal: Negative.  Negative for back pain.  Skin: Negative.   Neurological: Negative for dizziness, tremors, speech change, focal weakness, seizures and headaches.  Endo/Heme/Allergies: Negative.  Does not bruise/bleed easily.  Psychiatric/Behavioral: Negative.  Negative for depression, hallucinations and suicidal ideas.       DRUG ALLERGIES:   Allergies  Allergen Reactions  . Bee Venom Swelling  . Enalapril Maleate Swelling    VITALS:  Blood pressure 115/77, pulse 92, temperature 98 F (36.7 C), temperature source Oral, resp. rate 16, weight 109 kg, SpO2 100 %.  PHYSICAL EXAMINATION:  Constitutional: Appears well-developed and well-nourished. No distress. HENT: Normocephalic. [name] Oropharynx is clear and moist.  Eyes: Conjunctivae and EOM are normal. PERRLA, no scleral icterus.  Neck: Normal ROM. Neck supple. No JVD. No tracheal deviation. CVS: RRR, S1/S2 +, no murmurs, no gallops, no carotid bruit.  Pulmonary: Effort and breath sounds normal, no stridor, rhonchi,  wheezes, rales.  Abdominal: Soft. BS +,  no distension, tenderness, rebound or guarding.  Musculoskeletal: 4-5 bilateral lower extremity strength. No edema and no tenderness.  Neuro: Alert. CN 2-12 grossly intact. No focal deficits. Skin: Skin is warm and dry. No rash noted. Psychiatric: Confusion has improved   LABORATORY PANEL:   CBC Recent Labs  Lab 08/21/18 1305  WBC 8.2  HGB 9.9*  HCT 30.9*  PLT 470*   ------------------------------------------------------------------------------------------------------------------  Chemistries  Recent Labs  Lab 08/21/18 1305  NA 139  K 4.2  CL 106  CO2 24  GLUCOSE 98  BUN 13  CREATININE 1.45*  CALCIUM 9.4  AST 57*  ALT 34  ALKPHOS 176*  BILITOT 0.4   ------------------------------------------------------------------------------------------------------------------  Cardiac Enzymes No results for input(s): TROPONINI in the last 168 hours. ------------------------------------------------------------------------------------------------------------------  RADIOLOGY:  Ct Head Wo Contrast  Result Date: 08/21/2018 CLINICAL DATA:  Altered mental status. Worsening shortness of breath over the past 3 days. EXAM: CT HEAD WITHOUT CONTRAST TECHNIQUE: Contiguous axial images were obtained from the base of the skull through the vertex without intravenous contrast. COMPARISON:  Head CT scan 02/28/2018 FINDINGS: Brain: No evidence of acute infarction, hemorrhage, hydrocephalus, extra-axial collection or mass lesion/mass effect. Mild atrophy and chronic microvascular ischemic change noted. Vascular: No hyperdense vessel or unexpected calcification. Skull: Intact.  No focal lesion. Sinuses/Orbits: Right mastoid effusion is unchanged. Otherwise negative. Other: None. IMPRESSION: No acute abnormality. Mild atrophy and chronic microvascular ischemic change. Right mastoid effusion, unchanged. Electronically Signed   By: Inge Rise M.D.   On:  08/21/2018 14:09   Dg Chest Portable 1 View  Result Date: 08/21/2018 CLINICAL DATA:  Acute mental status change. EXAM: PORTABLE  CHEST 1 VIEW COMPARISON:  July 15, 2018 FINDINGS: The heart size and mediastinal contours are within normal limits. Both lungs are clear. The visualized skeletal structures are unremarkable. IMPRESSION: No active disease. Electronically Signed   By: Dorise Bullion III M.D   On: 08/21/2018 13:41     ASSESSMENT AND PLAN:   78 year old female with history of chronic diastolic heart failure, CVA and seizure disorder presents from [place] care due to altered mental status.  1.  Acute metabolic encephalopathy in setting of UTI: Patient with history of ESBL UTI back in June. Mental status has improved.   2.  UTI: I ordered urine culture, but this is still pending.  Blood cultures 1 out of 4 bottles showing gram-positive cocci is a contaminant. Continue Zosyn as per sensitivities per last urine culture. Patient may need PICC line with IV antibiotics pending results of urine culture, given history of ESBL in June   3.  Chronic kidney disease stage III: Creatinine is at baseline  4.  COPD without signs of exacerbation  5.  History of CVA: Continue Plavix and statin.  6.  Essential hypertension: Continue hydralazine  7.  Seizure disorder: Continue Keppra  8.  Diabetes: Continue Tradjenta with sliding scale  9.  Depression: Continue Prozac  10.  Chronic diastolic heart failure without signs of exacerbation  Clinical social work consult placed for discharge planning    CODE STATUS: full  TOTAL TIME TAKING CARE OF THIS PATIENT: 25 minutes.     POSSIBLE D/C tomorrow if urine culture and sensitivities back,   Bettey Costa M.D on 08/23/2018 at 11:05 AM  Between 7am to 6pm - Pager - [phone] After 6pm go to [email or web address] - password [email or web address] [place] [facility]  Office  [phone]  CC: Primary care physician; Valerie Roys,  DO  Note: This dictation was prepared with Dragon dictation along with smaller phrase technology. Any transcriptional errors that result from this process are unintentional.

## 2018-08-23 NOTE — TOC Progression Note (Signed)
Transition of Care [facility]) - Progression Note    Patient Details  Name: Katherine [name] MRN: [id number] Date of Birth: [birth year]  [facility] CM/SW Contact  Toris Laverdiere, Lenice Llamas Phone Number: [phone]  08/23/2018, 9:13 AM  Clinical Narrative: Clinical Social Worker (CSW) attempted to contact patient's husband Katherine [name] however he did not answer and a voicemail was left. CSW will continue to follow and assist as needed.      Expected Discharge Plan: [place] Barriers to Discharge: Continued Medical Work up  Expected Discharge Plan and Services Expected Discharge Plan: [place] In-house Referral: Clinical Social Work Discharge Planning Services: CM Consult   Living arrangements for the past 2 months: [place] Expected Discharge Date: 08/23/18                                     Social Determinants of Health (SDOH) Interventions    Readmission Risk Interventions Readmission Risk Prevention Plan 07/16/2018 04/29/2018 04/29/2018  Transportation Screening Complete Complete Complete  Medication Review Press photographer) Complete Complete Complete  PCP or Specialist appointment within 3-5 days of discharge Complete Not Complete -  PCP/Specialist Appt Not Complete comments - Patient to return to SNF, and they will make appointments  -  [place] or Home Care Consult Complete (No Data) -  SW Recovery Care/Counseling Consult Not Complete (No Data) -  SW Consult Not Complete Comments na - -  Palliative Care Screening Not Applicable Complete -  Medication Reconcilation ([place] Complete Complete -  Some recent data might be hidden

## 2018-08-24 LAB — GLUCOSE, CAPILLARY
Glucose-Capillary: 96 mg/dL (ref 70–99)
Glucose-Capillary: 93 mg/dL (ref 70–99)
Glucose-Capillary: 115 mg/dL — ABNORMAL HIGH (ref 70–99)
Glucose-Capillary: 106 mg/dL — ABNORMAL HIGH (ref 70–99)

## 2018-08-24 LAB — CULTURE, BLOOD (ROUTINE X 2)

## 2018-08-24 LAB — URINE CULTURE: Culture: >=100000 — AB

## 2018-08-24 LAB — TROPONIN I (HIGH SENSITIVITY)
Troponin I (High Sensitivity): 6 ng/L (ref <18)
Troponin I (High Sensitivity): 4 ng/L (ref <18)

## 2018-08-24 MED ORDER — ALUM & MAG HYDROXIDE-SIMETH 200-200-20 MG/5ML PO SUSP: 30.0000 mL | Freq: Four times a day (QID) | ORAL | Status: DC | PRN

## 2018-08-24 MED ORDER — ASPIRIN EC 325 MG PO TBEC
325.0000 mg | DELAYED_RELEASE_TABLET | Freq: Every day | ORAL | Status: DC
  Administered 2018-08-24 – 2018-08-26 (×3): 325 mg via ORAL
  Filled 2018-08-24 (×3): qty 1

## 2018-08-24 NOTE — Progress Notes (Signed)
[name] at [place] NAME: Katherine [name]    MR#:  [id number]  DATE OF BIRTH:  1940/08/29  SUBJECTIVE:   patient here with AMS from [place] The patient has no complaints. REVIEW OF SYSTEMS:    Patient poor historian DRUG ALLERGIES:   Allergies  Allergen Reactions  . Bee Venom Swelling  . Enalapril Maleate Swelling    VITALS:  Blood pressure 117/74, pulse 98, temperature 97.8 F (36.6 C), resp. rate 20, weight 109 kg, SpO2 100 %.  PHYSICAL EXAMINATION:  Constitutional: Appears well-developed and well-nourished. No distress. HENT: Normocephalic. [name] Oropharynx is clear and moist.  Eyes: Conjunctivae and EOM are normal. PERRLA, no scleral icterus.  Neck: Normal ROM. Neck supple. No JVD. No tracheal deviation. CVS: RRR, S1/S2 +, no murmurs, no gallops, no carotid bruit.  Pulmonary: Effort and breath sounds normal, no stridor, rhonchi, wheezes, rales.  Abdominal: Soft. BS +,  no distension, tenderness, rebound or guarding.  Musculoskeletal: Normal range of motion. No edema and no tenderness.  Neuro: Alert. CN 2-12 grossly intact. No focal deficits. Skin: Skin is warm and dry. No rash noted. Psychiatric: Some confusion noted LABORATORY PANEL:   CBC Recent Labs  Lab 08/21/18 1305  WBC 8.2  HGB 9.9*  HCT 30.9*  PLT 470*   ------------------------------------------------------------------------------------------------------------------  Chemistries  Recent Labs  Lab 08/21/18 1305  NA 139  K 4.2  CL 106  CO2 24  GLUCOSE 98  BUN 13  CREATININE 1.45*  CALCIUM 9.4  AST 57*  ALT 34  ALKPHOS 176*  BILITOT 0.4   ------------------------------------------------------------------------------------------------------------------  Cardiac Enzymes No results for input(s): TROPONINI in the last 168  hours. ------------------------------------------------------------------------------------------------------------------  RADIOLOGY:  No results found.   ASSESSMENT AND PLAN:   78 year old female with history of chronic diastolic heart failure, CVA and seizure disorder presents from [place] care due to altered mental status.  1.  Acute metabolic encephalopathy in setting of UTI: Patient with history of ESBL UTI back in June. Mental status is slowly improving  2.  UTI: I ordered urine culture.  Blood cultures 1 out of 4 bottles showing gram-positive cocci likely contaminant. Urine culture reported ESBL.  Sensitive to imipenem and Zosyn. Patient may need PICC line with IV antibiotics. 3.  Chronic kidney disease stage III: Creatinine is at baseline  4.  COPD without signs of exacerbation  5.  History of CVA: Continue Plavix and statin.  6.  Essential hypertension: Continue hydralazine  7.  Seizure disorder: Continue Keppra  8.  Diabetes: Continue Tradjenta with sliding scale  9.  Depression: Continue Prozac  10.  Chronic diastolic heart failure without signs of exacerbation CKD stage III.  Stable. Anemia of chronic kidney disease.  Stable.  CODE STATUS: full  TOTAL TIME TAKING CARE OF THIS PATIENT: 27 minutes.  POSSIBLE D/C 1-2, DEPENDING ON CLINICAL CONDITION.   Demetrios Loll M.D on 08/24/2018 at 3:33 PM  Between 7am to 6pm - Pager - [phone] After 6pm go to [email or web address] - password [email or web address] [place] [facility]  Office  [phone]  CC: Primary care physician; Valerie Roys, DO  Note: This dictation was prepared with Dragon dictation along with smaller phrase technology. Any transcriptional errors that result from this process are unintentional.

## 2018-08-25 LAB — GLUCOSE, CAPILLARY
Glucose-Capillary: 86 mg/dL (ref 70–99)
Glucose-Capillary: 123 mg/dL — ABNORMAL HIGH (ref 70–99)
Glucose-Capillary: 131 mg/dL — ABNORMAL HIGH (ref 70–99)
Glucose-Capillary: 127 mg/dL — ABNORMAL HIGH (ref 70–99)

## 2018-08-25 MED ORDER — ONDANSETRON HCL 4 MG/2ML IJ SOLN
4.0000 mg | Freq: Four times a day (QID) | INTRAMUSCULAR | Status: DC | PRN
  Administered 2018-08-25: 22:00:00 4 mg via INTRAVENOUS
  Filled 2018-08-25: qty 2

## 2018-08-25 MED ORDER — PROMETHAZINE HCL 25 MG/ML IJ SOLN
12.5000 mg | Freq: Four times a day (QID) | INTRAMUSCULAR | Status: DC | PRN
  Administered 2018-08-25: 12.5 mg via INTRAVENOUS
  Filled 2018-08-25: qty 1

## 2018-08-25 NOTE — Plan of Care (Signed)

## 2018-08-25 NOTE — Progress Notes (Signed)
[facility] NAME: Katherine [name]    MR#:  [id number]  DATE OF BIRTH:  11-13-1940  SUBJECTIVE:   patient here with AMS from [place] The patient has no complaints. REVIEW OF SYSTEMS:    Patient poor historian.  She complains of nausea. DRUG ALLERGIES:   Allergies  Allergen Reactions  . Bee Venom Swelling  . Enalapril Maleate Swelling    VITALS:  Blood pressure 117/71, pulse 86, temperature 98.6 F (37 C), temperature source Oral, resp. rate 17, height 5\' 3"  (1.6 m), weight 108.6 kg, SpO2 100 %.  PHYSICAL EXAMINATION:  Constitutional: Appears well-developed and well-nourished. No distress. HENT: Normocephalic. [name] Oropharynx is clear and moist.  Eyes: Conjunctivae and EOM are normal. PERRLA, no scleral icterus.  Neck: Normal ROM. Neck supple. No JVD. No tracheal deviation. CVS: RRR, S1/S2 +, no murmurs, no gallops, no carotid bruit.  Pulmonary: Effort and breath sounds normal, no stridor, rhonchi, wheezes, rales.  Abdominal: Soft. BS +,  no distension, tenderness, rebound or guarding.  Musculoskeletal: Normal range of motion. No edema and no tenderness.  Neuro: Alert. CN 2-12 grossly intact. No focal deficits. Skin: Skin is warm and dry. No rash noted. Psychiatric: Some confusion noted LABORATORY PANEL:   CBC Recent Labs  Lab 08/21/18 1305  WBC 8.2  HGB 9.9*  HCT 30.9*  PLT 470*   ------------------------------------------------------------------------------------------------------------------  Chemistries  Recent Labs  Lab 08/21/18 1305  NA 139  K 4.2  CL 106  CO2 24  GLUCOSE 98  BUN 13  CREATININE 1.45*  CALCIUM 9.4  AST 57*  ALT 34  ALKPHOS 176*  BILITOT 0.4   ------------------------------------------------------------------------------------------------------------------  Cardiac Enzymes No results for input(s): TROPONINI in the last 168  hours. ------------------------------------------------------------------------------------------------------------------  RADIOLOGY:  No results found.   ASSESSMENT AND PLAN:   78 year old female with history of chronic diastolic heart failure, CVA and seizure disorder presents from [place] care due to altered mental status.  1.  Acute metabolic encephalopathy in setting of UTI: Patient with history of ESBL UTI back in June. Mental status is slowly improving  2.  UTI: I ordered urine culture.  Blood cultures 1 out of 4 bottles showing gram-positive cocci likely contaminant. Urine culture reported ESBL.  Sensitive to imipenem and Zosyn. Per Dr. Verita Lamb, this is day 5 antibiotics, she won't need any more antibiotics. 3.  Chronic kidney disease stage III: Creatinine is at baseline  4.  COPD without signs of exacerbation  5.  History of CVA: Continue Plavix and statin.  6.  Essential hypertension: Continue hydralazine  7.  Seizure disorder: Continue Keppra  8.  Diabetes: Continue Tradjenta with sliding scale  9.  Depression: Continue Prozac  10.  Chronic diastolic heart failure without signs of exacerbation CKD stage III.  Stable. Anemia of chronic kidney disease.  Stable. I discussed with Dr. Dr. Verita Lamb. CODE STATUS: full I called her souse, but nobody answered the phone. TOTAL TIME TAKING CARE OF THIS PATIENT: 27 minutes.  POSSIBLE D/C 1-2, DEPENDING ON CLINICAL CONDITION.   Demetrios Loll M.D on 08/25/2018 at 11:18 AM  Between 7am to 6pm - Pager - [phone] After 6pm go to [email or web address] - password [email or web address] [facility]  Office  [phone]  CC: Primary care physician; Valerie Roys, DO  Note: This dictation was prepared with Dragon dictation along with smaller phrase technology. Any transcriptional errors that result from this process are unintentional.

## 2018-08-25 NOTE — Plan of Care (Signed)
Problem: Education: Goal: Knowledge of General Education information will improve Description: Including pain rating scale, medication(s)/side effects and non-pharmacologic comfort measures 08/25/2018 0420 by [name], RN Outcome: Progressing 08/25/2018 0317 by [name], RN Outcome: Progressing   Problem: Health Behavior/Discharge Planning: Goal: Ability to manage health-related needs will improve 08/25/2018 0420 by [name], RN Outcome: Progressing 08/25/2018 0317 by [name], RN Outcome: Progressing   Problem: Clinical Measurements: Goal: Ability to maintain clinical measurements within normal limits will improve 08/25/2018 0420 by [name], RN Outcome: Progressing 08/25/2018 0317 by [name], RN Outcome: Progressing Goal: Will remain free from infection 08/25/2018 0420 by [name], RN Outcome: Progressing 08/25/2018 0317 by [name], RN Outcome: Progressing Goal: Diagnostic test results will improve 08/25/2018 0420 by [name], RN Outcome: Progressing 08/25/2018 0317 by [name], RN Outcome: Progressing Goal: Respiratory complications will improve 08/25/2018 0420 by [name], RN Outcome: Progressing 08/25/2018 0317 by [name], RN Outcome: Progressing Goal: Cardiovascular complication will be avoided 08/25/2018 0420 by [name], RN Outcome: Progressing 08/25/2018 0317 by [name], RN Outcome: Progressing   Problem: Activity: Goal: Risk for activity intolerance will decrease 08/25/2018 0420 by [name], RN Outcome: Progressing 08/25/2018 0317 by [name], RN Outcome: Progressing   Problem: Nutrition: Goal: Adequate nutrition will be maintained 08/25/2018 0420 by [name], RN Outcome: Progressing 08/25/2018 0317 by [name], RN Outcome: Progressing   Problem:  Coping: Goal: Level of anxiety will decrease 08/25/2018 0420 by [name], RN Outcome: Progressing 08/25/2018 0317 by [name], RN Outcome: Progressing   Problem: Elimination: Goal: Will not experience complications related to bowel motility 08/25/2018 0420 by [name], RN Outcome: Progressing 08/25/2018 0317 by [name], RN Outcome: Progressing Goal: Will not experience complications related to urinary retention 08/25/2018 0420 by [name], RN Outcome: Progressing 08/25/2018 0317 by [name], RN Outcome: Progressing   Problem: Pain Managment: Goal: General experience of comfort will improve 08/25/2018 0420 by [name], RN Outcome: Progressing 08/25/2018 0317 by [name], RN Outcome: Progressing   Problem: Safety: Goal: Ability to remain free from injury will improve 08/25/2018 0420 by [name], RN Outcome: Progressing 08/25/2018 0317 by [name], RN Outcome: Progressing   Problem: Skin Integrity: Goal: Risk for impaired skin integrity will decrease 08/25/2018 0420 by [name], RN Outcome: Progressing 08/25/2018 0317 by [name], RN Outcome: Progressing   Problem: Education: Goal: Knowledge of General Education information will improve Description: Including pain rating scale, medication(s)/side effects and non-pharmacologic comfort measures 08/25/2018 0420 by [name], RN Outcome: Progressing 08/25/2018 0317 by [name], RN Outcome: Progressing   Problem: Health Behavior/Discharge Planning: Goal: Ability to manage health-related needs will improve 08/25/2018 0420 by [name], RN Outcome: Progressing 08/25/2018 0317 by [name], RN Outcome: Progressing   Problem: Clinical Measurements: Goal: Ability to maintain clinical measurements within normal limits will improve 08/25/2018 0420 by  [name], RN Outcome: Progressing 08/25/2018 0317 by [name], RN Outcome: Progressing Goal: Will remain free from infection 08/25/2018 0420 by [name], RN Outcome: Progressing 08/25/2018 0317 by [name], RN Outcome: Progressing Goal: Diagnostic test results will improve 08/25/2018 0420 by [name], RN Outcome: Progressing 08/25/2018 0317 by [name], RN Outcome: Progressing Goal: Respiratory complications will improve 08/25/2018 0420 by  [name], RN Outcome: Progressing 08/25/2018 0317 by [name], RN Outcome: Progressing Goal: Cardiovascular complication will be avoided 08/25/2018 0420 by [name], RN Outcome: Progressing 08/25/2018 0317 by [name], RN Outcome: Progressing   Problem: Activity: Goal: Risk for activity intolerance will decrease 08/25/2018 0420 by [name], RN Outcome: Progressing 08/25/2018 0317 by [name], RN Outcome: Progressing   Problem: Nutrition: Goal: Adequate nutrition will be maintained 08/25/2018 0420 by [name], RN Outcome: Progressing 08/25/2018 0317 by [name], RN Outcome: Progressing   Problem: Coping: Goal: Level of anxiety will decrease 08/25/2018 0420 by [name], RN Outcome: Progressing 08/25/2018 0317 by [name], RN Outcome: Progressing   Problem: Elimination: Goal: Will not experience complications related to bowel motility 08/25/2018 0420 by [name], RN Outcome: Progressing 08/25/2018 0317 by [name], RN Outcome: Progressing Goal: Will not experience complications related to urinary retention 08/25/2018 0420 by [name], RN Outcome: Progressing 08/25/2018 0317 by [name], RN Outcome: Progressing   Problem: Pain Managment: Goal: General experience of comfort will improve 08/25/2018 0420 by  [name], RN Outcome: Progressing 08/25/2018 0317 by [name], RN Outcome: Progressing   Problem: Safety: Goal: Ability to remain free from injury will improve 08/25/2018 0420 by [name], RN Outcome: Progressing 08/25/2018 0317 by [name], RN Outcome: Progressing   Problem: Skin Integrity: Goal: Risk for impaired skin integrity will decrease 08/25/2018 0420 by [name], RN Outcome: Progressing 08/25/2018 0317 by [name], RN Outcome: Progressing

## 2018-08-26 LAB — GLUCOSE, CAPILLARY
Glucose-Capillary: 88 mg/dL (ref 70–99)
Glucose-Capillary: 91 mg/dL (ref 70–99)

## 2018-08-26 LAB — CULTURE, BLOOD (ROUTINE X 2): Culture: NO GROWTH

## 2018-08-26 LAB — SARS CORONAVIRUS 2 BY RT PCR (HOSPITAL ORDER, PERFORMED IN ~~LOC~~ HOSPITAL LAB): SARS Coronavirus 2: NEGATIVE

## 2018-08-26 NOTE — Plan of Care (Signed)

## 2018-08-26 NOTE — Discharge Summary (Addendum)
[facility] NAME: Katherine [name]    MR#:  [id number]  DATE OF BIRTH:  [birth year]  DATE OF ADMISSION:  08/21/2018   ADMITTING PHYSICIAN: Lang Snow, NP  DATE OF DISCHARGE:  08/26/2018 PRIMARY CARE PHYSICIAN: Valerie Roys, DO   ADMISSION DIAGNOSIS:  ESBL (extended spectrum beta-lactamase) producing bacteria infection [A49.9, Z16.12] Acute encephalopathy [G93.40] DISCHARGE DIAGNOSIS:  Active Problems:   UTI (urinary tract infection)  SECONDARY DIAGNOSIS:   Past Medical History:  Diagnosis Date  . Angioedema   . Anxiety and depression   . CHF (congestive heart failure) ([place])   . Chronic constipation   . Chronic kidney disease    stage 3  . COPD (chronic obstructive pulmonary disease) ([place])   . Diabetes mellitus without complication ([place])   . Gallstones   . GERD (gastroesophageal reflux disease)   . Hypertension   . Left ventricular hypertrophy   . Osteoarthritis   . Prurigo nodularis   . Seizures ([place])   . Stroke ([place])   . Tobacco abuse   . Vitamin D deficiency disease    HOSPITAL COURSE:  78 year old female with history of chronic diastolic heart failure, CVA and seizure disorder presents from [facility] due to altered mental status.  1.  Acute metabolic encephalopathy in setting of UTI: Patient with history of ESBL UTI back in June. Mental status improved to baseline.  2.  UTI: I ordered urine culture.  Blood cultures 1 out of 4 bottles showing gram-positive cocci likely contaminant. Urine culture reported ESBL.  Sensitive to imipenem and Zosyn. Per Dr. Verita Lamb, this is day 5 antibiotics, she won't need any more antibiotics. 3.  Chronic kidney disease stage III: Creatinine is at baseline  4.  COPD without signs of exacerbation  5.  History of CVA: Continue Plavix and statin.  6.  Essential hypertension: Continue hydralazine  7.  Seizure disorder: Continue Keppra  8.   Diabetes: Continue Tradjenta with sliding scale  9.  Depression: Continue Prozac  10.  Chronic diastolic heart failure without signs of exacerbation CKD stage III.  Stable. Anemia of chronic kidney disease.  Stable.  DISCHARGE CONDITIONS:  Stable, discharge to nursing home today. CONSULTS OBTAINED:   DRUG ALLERGIES:   Allergies  Allergen Reactions  . Bee Venom Swelling  . Enalapril Maleate Swelling   DISCHARGE MEDICATIONS:   Allergies as of 08/26/2018      Reactions   Bee Venom Swelling   Enalapril Maleate Swelling      Medication List    TAKE these medications   acetaminophen 650 MG CR tablet Commonly known as: TYLENOL Take 650 mg by mouth 3 (three) times daily.   albuterol 108 (90 Base) MCG/ACT inhaler Commonly known as: VENTOLIN HFA Inhale 2 puffs into the lungs every 4 (four) hours as needed for wheezing or shortness of breath.   albuterol 0.63 MG/3ML nebulizer solution Commonly known as: ACCUNEB Take 3 mLs by nebulization every 8 (eight) hours as needed for wheezing.   atorvastatin 80 MG tablet Commonly known as: LIPITOR Take 80 mg by mouth every evening.   cetirizine 10 MG tablet Commonly known as: ZYRTEC Take 5 mg by mouth at bedtime.   cholecalciferol 1000 units tablet Commonly known as: VITAMIN D Take 1,000 Units by mouth daily.   clopidogrel 75 MG tablet Commonly known as: PLAVIX Take 75 mg by mouth daily.   colchicine 0.6 MG tablet Take 1 tablet (0.6 mg total) by  mouth daily.   cyanocobalamin 1000 MCG tablet Take 1 tablet (1,000 mcg total) by mouth daily.   diclofenac sodium 1 % Gel Commonly known as: VOLTAREN Apply 2 g topically every 12 (twelve) hours as needed (for left knee pain).   docusate sodium 100 MG capsule Commonly known as: COLACE Take 100 mg by mouth 2 (two) times daily.   FLUoxetine 40 MG capsule Commonly known as: PROZAC Take 40 mg by mouth daily.   Fluticasone-Umeclidin-Vilant 100-62.5-25 MCG/INH Aepb Inhale 1  puff into the lungs daily.   gabapentin 300 MG capsule Commonly known as: NEURONTIN Take 300 mg by mouth at bedtime.   hydrALAZINE 25 MG tablet Commonly known as: APRESOLINE Take 25 mg by mouth 3 (three) times daily.   ipratropium 0.03 % nasal spray Commonly known as: ATROVENT Place 2 sprays into both nostrils 2 (two) times daily.   ipratropium-albuterol 0.5-2.5 (3) MG/3ML Soln Commonly known as: DUONEB Take 3 mLs by nebulization 3 (three) times daily.   levETIRAcetam 500 MG 24 hr tablet Commonly known as: KEPPRA XR Take 1 tablet (500 mg total) by mouth daily.   linagliptin 5 MG Tabs tablet Commonly known as: TRADJENTA Take 5 mg by mouth daily.   LORazepam 2 MG/ML injection Commonly known as: ATIVAN Inject 0.5 mLs (1 mg total) into the muscle daily as needed for seizure.   meclizine 25 MG tablet Commonly known as: ANTIVERT Take 25 mg by mouth every 12 (twelve) hours as needed for dizziness.   multivitamin with minerals Tabs tablet Take 1 tablet by mouth daily.   mupirocin ointment 2 % Commonly known as: BACTROBAN Place 1 application into the nose 2 (two) times daily.   nystatin 100000 UNIT/ML suspension Commonly known as: MYCOSTATIN Take 5 mLs (500,000 Units total) by mouth 4 (four) times daily.   pantoprazole 40 MG tablet Commonly known as: PROTONIX Take 1 tablet (40 mg total) by mouth daily.        DISCHARGE INSTRUCTIONS:  See AVS.  If you experience worsening of your admission symptoms, develop shortness of breath, life threatening emergency, suicidal or homicidal thoughts you must seek medical attention immediately by calling 911 or calling your MD immediately  if symptoms less severe.  You Must read complete instructions/literature along with all the possible adverse reactions/side effects for all the Medicines you take and that have been prescribed to you. Take any new Medicines after you have completely understood and accpet all the possible adverse  reactions/side effects.   Please note  You were cared for by a hospitalist during your hospital stay. If you have any questions about your discharge medications or the care you received while you were in the hospital after you are discharged, you can call the unit and asked to speak with the hospitalist on call if the hospitalist that took care of you is not available. Once you are discharged, your primary care physician will handle any further medical issues. Please note that NO REFILLS for any discharge medications will be authorized once you are discharged, as it is imperative that you return to your primary care physician (or establish a relationship with a primary care physician if you do not have one) for your aftercare needs so that they can reassess your need for medications and monitor your lab values.    On the day of Discharge:  VITAL SIGNS:  Blood pressure 119/75, pulse 89, temperature 97.9 F (36.6 C), temperature source Oral, resp. rate 19, height 5\' 3"  (1.6 m), weight 108.6  kg, SpO2 91 %. PHYSICAL EXAMINATION:  GENERAL:  78 y.o.-year-old patient lying in the bed with no acute distress.  EYES: Pupils equal, round, reactive to light and accommodation. No scleral icterus. Extraocular muscles intact.  HEENT: Head atraumatic, normocephalic. Oropharynx and nasopharynx clear.  NECK:  Supple, no jugular venous distention. No thyroid enlargement, no tenderness.  LUNGS: Normal breath sounds bilaterally, no wheezing, rales,rhonchi or crepitation. No use of accessory muscles of respiration.  CARDIOVASCULAR: S1, S2 normal. No murmurs, rubs, or gallops.  ABDOMEN: Soft, non-tender, non-distended. Bowel sounds present. No organomegaly or mass.  EXTREMITIES: No pedal edema, cyanosis, or clubbing.  NEUROLOGIC: Cranial nerves II through XII are intact. Muscle strength 3-4/5 in all extremities. Sensation intact. Gait not checked.  PSYCHIATRIC: The patient is alert and oriented x 2.  SKIN: No  obvious rash, lesion, or ulcer.  DATA REVIEW:   CBC Recent Labs  Lab 08/21/18 1305  WBC 8.2  HGB 9.9*  HCT 30.9*  PLT 470*    Chemistries  Recent Labs  Lab 08/21/18 1305  NA 139  K 4.2  CL 106  CO2 24  GLUCOSE 98  BUN 13  CREATININE 1.45*  CALCIUM 9.4  AST 57*  ALT 34  ALKPHOS 176*  BILITOT 0.4     Microbiology Results  Results for orders placed or performed during the hospital encounter of 08/21/18  Urine Culture     Status: Abnormal   Collection Time: 08/21/18  1:05 PM   Specimen: Urine, Clean Catch  Result Value Ref Range Status   Specimen Description   Final    URINE, CATHETERIZED Performed at [facility], [address], [place], [place] [address]    Special Requests   Final    NONE Performed at [facility], [address] [place], [place] [address]    Culture (A)  Final    >=100,000 COLONIES/mL ESCHERICHIA COLI Confirmed Extended Spectrum Beta-Lactamase Producer (ESBL).  In bloodstream infections from ESBL organisms, carbapenems are preferred over piperacillin/tazobactam. They are shown to have a lower risk of mortality.    Report Status 08/24/2018 FINAL  Final   Organism ID, Bacteria ESCHERICHIA COLI (A)  Final      Susceptibility   Escherichia coli - MIC*    AMPICILLIN >=32 RESISTANT Resistant     CEFAZOLIN >=64 RESISTANT Resistant     CEFTRIAXONE >=64 RESISTANT Resistant     CIPROFLOXACIN >=4 RESISTANT Resistant     GENTAMICIN >=16 RESISTANT Resistant     IMIPENEM <=0.25 SENSITIVE Sensitive     NITROFURANTOIN <=16 SENSITIVE Sensitive     TRIMETH/SULFA >=320 RESISTANT Resistant     AMPICILLIN/SULBACTAM >=32 RESISTANT Resistant     PIP/TAZO 8 SENSITIVE Sensitive     Extended ESBL POSITIVE Resistant     * >=100,000 COLONIES/mL ESCHERICHIA COLI  Blood culture (routine x 2)     Status: Abnormal   Collection Time: 08/21/18  1:32 PM   Specimen: BLOOD  Result Value Ref Range Status   Specimen Description   Final    BLOOD  BLOOD RIGHT ARM Performed at [facility], [address] [place], [place] [address]    Special Requests   Final    BOTTLES DRAWN AEROBIC AND ANAEROBIC Blood Culture results may not be optimal due to an excessive volume of blood received in culture bottles Performed at [facility], [address], [place], [place] [address]    Culture  Setup Time   Final    [facility] POSITIVE COCCI  AEROBIC BOTTLE ONLY CRITICAL RESULT CALLED TO, READ BACK BY AND VERIFIED WITH:  KISHAN PATEL ON 08/22/2018 AT 22 TIK    Culture (A)  Final    STAPHYLOCOCCUS SPECIES (COAGULASE NEGATIVE) THE SIGNIFICANCE OF ISOLATING THIS ORGANISM FROM A SINGLE SET OF BLOOD CULTURES WHEN MULTIPLE SETS ARE DRAWN IS UNCERTAIN. PLEASE NOTIFY THE MICROBIOLOGY DEPARTMENT WITHIN ONE WEEK IF SPECIATION AND SENSITIVITIES ARE REQUIRED. Performed at [facility], [address] [place], [place] [address]    Report Status 08/24/2018 FINAL  Final  SARS Coronavirus 2 (CEPHEID- Performed in [facility] hospital lab), Hosp Order     Status: None   Collection Time: 08/21/18  1:32 PM   Specimen: Urine, Catheterized; Nasopharyngeal  Result Value Ref Range Status   SARS Coronavirus 2 NEGATIVE NEGATIVE Final    Comment: (NOTE) If result is NEGATIVE SARS-CoV-2 target nucleic acids are NOT DETECTED. The SARS-CoV-2 RNA is generally detectable in upper and lower  respiratory specimens during the acute phase of infection. The lowest  concentration of SARS-CoV-2 viral copies this assay can detect is 250  copies / mL. A negative result does not preclude SARS-CoV-2 infection  and should not be used as the sole basis for treatment or other  patient management decisions.  A negative result may occur with  improper specimen collection / handling, submission of specimen other  than nasopharyngeal swab, presence of viral mutation(s) within the  areas targeted by this assay, and inadequate number of viral copies  (<250 copies /  mL). A negative result must be combined with clinical  observations, patient history, and epidemiological information. If result is POSITIVE SARS-CoV-2 target nucleic acids are DETECTED. The SARS-CoV-2 RNA is generally detectable in upper and lower  respiratory specimens dur ing the acute phase of infection.  Positive  results are indicative of active infection with SARS-CoV-2.  Clinical  correlation with patient history and other diagnostic information is  necessary to determine patient infection status.  Positive results do  not rule out bacterial infection or co-infection with other viruses. If result is PRESUMPTIVE POSTIVE SARS-CoV-2 nucleic acids MAY BE PRESENT.   A presumptive positive result was obtained on the submitted specimen  and confirmed on repeat testing.  While 2019 novel coronavirus  (SARS-CoV-2) nucleic acids may be present in the submitted sample  additional confirmatory testing may be necessary for epidemiological  and / or clinical management purposes  to differentiate between  SARS-CoV-2 and other Sarbecovirus currently known to infect humans.  If clinically indicated additional testing with an alternate test  methodology [id number]) is advised. The SARS-CoV-2 RNA is generally  detectable in upper and lower respiratory sp ecimens during the acute  phase of infection. The expected result is Negative. Fact Sheet for Patients:  StrictlyIdeas.no Fact Sheet for Healthcare Providers: BankingDealers.co.za This test is not yet approved or cleared by the [place] FDA and has been authorized for detection and/or diagnosis of SARS-CoV-2 by FDA under an Emergency Use Authorization (EUA).  This EUA will remain in effect (meaning this test can be used) for the duration of the COVID-19 declaration under Section 564(b)(1) of the Act, 21 U.S.C. section 360bbb-3(b)(1), unless the authorization is terminated or revoked sooner.  Performed at [facility], [address] [place], [place] [address]   Blood Culture ID Panel (Reflexed)     Status: Abnormal   Collection Time: 08/21/18  1:32 PM  Result Value Ref Range Status   Enterococcus species NOT DETECTED NOT DETECTED Final  Listeria monocytogenes NOT DETECTED NOT DETECTED Final   Staphylococcus species DETECTED (A) NOT DETECTED Final    Comment: Methicillin (oxacillin) resistant coagulase negative staphylococcus. Possible blood culture contaminant (unless isolated from more than one blood culture draw or clinical case suggests pathogenicity). No antibiotic treatment is indicated for blood  culture contaminants. CRITICAL RESULT CALLED TO, READ BACK BY AND VERIFIED WITH:  KISHAN PATEL ON 08/22/2018 AT 42 TIK    Staphylococcus aureus (BCID) NOT DETECTED NOT DETECTED Final   Methicillin resistance DETECTED (A) NOT DETECTED Final    Comment: CRITICAL RESULT CALLED TO, READ BACK BY AND VERIFIED WITH:  KISHAN PATEL ON 08/22/2018 AT 1231 TIK    Streptococcus species NOT DETECTED NOT DETECTED Final   Streptococcus agalactiae NOT DETECTED NOT DETECTED Final   Streptococcus pneumoniae NOT DETECTED NOT DETECTED Final   Streptococcus pyogenes NOT DETECTED NOT DETECTED Final   Acinetobacter baumannii NOT DETECTED NOT DETECTED Final   Enterobacteriaceae species NOT DETECTED NOT DETECTED Final   Enterobacter cloacae complex NOT DETECTED NOT DETECTED Final   Escherichia coli NOT DETECTED NOT DETECTED Final   Klebsiella oxytoca NOT DETECTED NOT DETECTED Final   Klebsiella pneumoniae NOT DETECTED NOT DETECTED Final   Proteus species NOT DETECTED NOT DETECTED Final   Serratia marcescens NOT DETECTED NOT DETECTED Final   Haemophilus influenzae NOT DETECTED NOT DETECTED Final   Neisseria meningitidis NOT DETECTED NOT DETECTED Final   Pseudomonas aeruginosa NOT DETECTED NOT DETECTED Final   Candida albicans NOT DETECTED NOT DETECTED Final   Candida glabrata NOT  DETECTED NOT DETECTED Final   Candida krusei NOT DETECTED NOT DETECTED Final   Candida parapsilosis NOT DETECTED NOT DETECTED Final   Candida tropicalis NOT DETECTED NOT DETECTED Final    Comment: Performed at [facility], [address] [place], [place] [address]  Blood culture (routine x 2)     Status: None   Collection Time: 08/21/18  1:33 PM   Specimen: BLOOD  Result Value Ref Range Status   Specimen Description BLOOD BLOOD RIGHT HAND  Final   Special Requests   Final    BOTTLES DRAWN AEROBIC AND ANAEROBIC Blood Culture results may not be optimal due to an excessive volume of blood received in culture bottles   Culture   Final    NO GROWTH 5 DAYS Performed at [facility], [address] [place],  [address]    Report Status 08/26/2018 FINAL  Final  Gastrointestinal Panel by PCR , Stool     Status: None   Collection Time: 08/21/18  6:45 PM   Specimen: STOOL  Result Value Ref Range Status   Campylobacter species NOT DETECTED NOT DETECTED Final   Plesimonas shigelloides NOT DETECTED NOT DETECTED Final   Salmonella species NOT DETECTED NOT DETECTED Final   Yersinia enterocolitica NOT DETECTED NOT DETECTED Final   Vibrio species NOT DETECTED NOT DETECTED Final   Vibrio cholerae NOT DETECTED NOT DETECTED Final   Enteroaggregative E coli (EAEC) NOT DETECTED NOT DETECTED Final   Enteropathogenic E coli (EPEC) NOT DETECTED NOT DETECTED Final   Enterotoxigenic E coli (ETEC) NOT DETECTED NOT DETECTED Final   Shiga like toxin producing E coli (STEC) NOT DETECTED NOT DETECTED Final   Shigella/Enteroinvasive E coli (EIEC) NOT DETECTED NOT DETECTED Final   Cryptosporidium NOT DETECTED NOT DETECTED Final   Cyclospora cayetanensis NOT DETECTED NOT DETECTED Final   Entamoeba histolytica NOT DETECTED NOT DETECTED Final   Giardia lamblia NOT DETECTED NOT DETECTED Final  Adenovirus F40/41 NOT DETECTED NOT DETECTED Final   Astrovirus NOT DETECTED NOT DETECTED Final    Norovirus GI/GII NOT DETECTED NOT DETECTED Final   Rotavirus A NOT DETECTED NOT DETECTED Final   Sapovirus (I, II, IV, and V) NOT DETECTED NOT DETECTED Final    Comment: Performed at [facility], [address] [place], [place] [address]  C difficile quick scan w PCR reflex     Status: Abnormal   Collection Time: 08/21/18  6:45 PM   Specimen: STOOL  Result Value Ref Range Status   C Diff antigen POSITIVE (A) NEGATIVE Final   C Diff toxin NEGATIVE NEGATIVE Final   C Diff interpretation Results are indeterminate. See PCR results.  Final    Comment: Performed at [facility], [address] [place], [place] [address]  C. Diff by PCR, Reflexed     Status: None   Collection Time: 08/21/18  6:45 PM  Result Value Ref Range Status   Toxigenic C. Difficile by PCR NEGATIVE NEGATIVE Final    Comment: Patient is colonized with non toxigenic C. difficile. May not need treatment unless significant symptoms are present. Performed at [facility], [address] [place], [place] [address]   MRSA PCR Screening     Status: Abnormal   Collection Time: 08/23/18  5:54 AM   Specimen: Nasal Mucosa; Nasopharyngeal  Result Value Ref Range Status   MRSA by PCR POSITIVE (A) NEGATIVE Final    Comment:        The GeneXpert MRSA Assay (FDA approved for NASAL specimens only), is one component of a comprehensive MRSA colonization surveillance program. It is not intended to diagnose MRSA infection nor to guide or monitor treatment for MRSA infections. RESULT CALLED TO, READ BACK BY AND VERIFIED WITH: CHRIS BENNETT AT [phone] 08/23/2018.PMF Performed at [facility], [address] [place], [place] [address]     RADIOLOGY:  No results found.   Management plans discussed with the patient, her husband and they are in agreement.  CODE STATUS: Full Code   TOTAL TIME TAKING CARE OF THIS PATIENT: 32 minutes.    Demetrios Loll M.D on 08/26/2018 at 8:40 AM  Between 7am  to 6pm - Pager - [phone]  After 6pm go to [email or web address] - Proofreader  [employer] [facility]  Office  [phone]  CC: Primary care physician; Valerie Roys, DO   Note: This dictation was prepared with Dragon dictation along with smaller phrase technology. Any transcriptional errors that result from this process are unintentional.

## 2018-08-26 NOTE — Care Management Important Message (Signed)
Important Message  Patient Details  Name: Katherine [name] MRN: [id number] Date of Birth: Feb 20, 1940   Medicare Important Message Given:  Yes(Due to patient being on isolation bedside RN gave IM.)     Kortnee Bas, Lenice Llamas 08/26/2018, 2:47 PM

## 2018-08-26 NOTE — Plan of Care (Signed)
Problem: Education: Goal: Knowledge of General Education information will improve Description: Including pain rating scale, medication(s)/side effects and non-pharmacologic comfort measures 08/26/2018 0355 by [name], RN Outcome: Progressing 08/26/2018 0337 by [name], RN Outcome: Progressing   Problem: Clinical Measurements: Goal: Ability to maintain clinical measurements within normal limits will improve 08/26/2018 0355 by [name], RN Outcome: Progressing 08/26/2018 0337 by [name], RN Outcome: Progressing Goal: Will remain free from infection 08/26/2018 0355 by [name], RN Outcome: Progressing 08/26/2018 0337 by [name], RN Outcome: Progressing Goal: Diagnostic test results will improve 08/26/2018 0355 by [name], RN Outcome: Progressing 08/26/2018 0337 by [name], RN Outcome: Progressing Goal: Respiratory complications will improve 08/26/2018 0355 by [name], RN Outcome: Progressing 08/26/2018 0337 by [name], RN Outcome: Progressing Goal: Cardiovascular complication will be avoided 08/26/2018 0355 by [name], RN Outcome: Progressing 08/26/2018 0337 by [name], RN Outcome: Progressing   Problem: Activity: Goal: Risk for activity intolerance will decrease 08/26/2018 0355 by [name], RN Outcome: Progressing 08/26/2018 0337 by [name], RN Outcome: Progressing   Problem: Nutrition: Goal: Adequate nutrition will be maintained 08/26/2018 0355 by [name], RN Outcome: Progressing 08/26/2018 0337 by [name], RN Outcome: Progressing   Problem: Coping: Goal: Level of anxiety will decrease 08/26/2018 0355 by [name], RN Outcome: Progressing 08/26/2018 0337 by [name], RN Outcome: Progressing   Problem: Elimination: Goal: Will not experience complications  related to bowel motility 08/26/2018 0355 by [name], RN Outcome: Progressing 08/26/2018 0337 by [name], RN Outcome: Progressing Goal: Will not experience complications related to urinary retention 08/26/2018 0355 by [name], RN Outcome: Progressing 08/26/2018 0337 by [name], RN Outcome: Progressing   Problem: Pain Managment: Goal: General experience of comfort will improve 08/26/2018 0355 by [name], RN Outcome: Progressing 08/26/2018 0337 by [name], RN Outcome: Progressing   Problem: Safety: Goal: Ability to remain free from injury will improve 08/26/2018 0355 by [name], RN Outcome: Progressing 08/26/2018 0337 by [name], RN Outcome: Progressing   Problem: Skin Integrity: Goal: Risk for impaired skin integrity will decrease 08/26/2018 0355 by [name], RN Outcome: Progressing 08/26/2018 0337 by [name], RN Outcome: Progressing   Problem: Education: Goal: Knowledge of General Education information will improve Description: Including pain rating scale, medication(s)/side effects and non-pharmacologic comfort measures 08/26/2018 0355 by [name], RN Outcome: Progressing 08/26/2018 0337 by [name], RN Outcome: Progressing   Problem: Health Behavior/Discharge Planning: Goal: Ability to manage health-related needs will improve 08/26/2018 0355 by [name], RN Outcome: Progressing 08/26/2018 0337 by [name], RN Outcome: Progressing   Problem: Clinical Measurements: Goal: Ability to maintain clinical measurements within normal limits will improve 08/26/2018 0355 by [name], RN Outcome: Progressing 08/26/2018 0337 by [name], RN Outcome: Progressing Goal: Will remain free from infection 08/26/2018 0355 by [name], RN Outcome: Progressing 08/26/2018 0337 by [name], RN Outcome: Progressing Goal: Diagnostic test results will improve 08/26/2018 0355 by [name], RN Outcome: Progressing 08/26/2018 0337 by [name], RN Outcome: Progressing Goal: Respiratory complications will improve 08/26/2018 0355 by [name], RN Outcome: Progressing 08/26/2018 0337 by [name], RN Outcome: Progressing Goal: Cardiovascular complication will be avoided 08/26/2018 0355 by [name], RN Outcome: Progressing 08/26/2018 [phone]  by [name], RN Outcome: Progressing   Problem: Activity: Goal: Risk for activity intolerance will decrease 08/26/2018 0355 by [name], RN Outcome: Progressing 08/26/2018 0337 by [name], RN Outcome: Progressing   Problem: Nutrition: Goal: Adequate nutrition will be maintained 08/26/2018 0355 by [name], RN Outcome: Progressing 08/26/2018 0337 by [name], RN Outcome: Progressing   Problem: Coping: Goal: Level of anxiety will decrease 08/26/2018 0355 by [name], RN Outcome: Progressing 08/26/2018 0337 by [name], RN Outcome: Progressing   Problem: Elimination: Goal: Will not experience complications related to bowel motility 08/26/2018 0355 by [name], RN Outcome: Progressing 08/26/2018 0337 by [name], RN Outcome: Progressing Goal: Will not experience complications related to urinary retention 08/26/2018 0355 by [name], RN Outcome: Progressing 08/26/2018 0337 by [name], RN Outcome: Progressing   Problem: Pain Managment: Goal: General experience of comfort will improve 08/26/2018 0355 by [name], RN Outcome: Progressing 08/26/2018 0337 by [name], RN Outcome: Progressing   Problem: Safety: Goal: Ability to remain free from injury will improve 08/26/2018 0355 by [name], RN Outcome:  Progressing 08/26/2018 0337 by [name], RN Outcome: Progressing   Problem: Skin Integrity: Goal: Risk for impaired skin integrity will decrease 08/26/2018 0355 by [name], RN Outcome: Progressing 08/26/2018 0337 by [name], RN Outcome: Progressing

## 2018-08-26 NOTE — TOC Transition Note (Signed)
Transition of Care [facility]) - CM/SW Discharge Note   Patient Details  Name: Katherine [name] MRN: [id number] Date of Birth: 30-May-1940  Transition of Care [facility]) CM/SW Contact:  Zayvon Alicea, Lenice Llamas Phone Number: [phone] 08/26/2018, 1:57 PM   Clinical Narrative: Patient is medically stable for D/C back to [facility] today. Covid test is negative today 7/13. Per [facility] admissions coordinator at [facility] patient can come today to room 29-B. RN will call report and arrange EMS for transport. Clinical Education officer, museum (CSW) sent D/C orders to [facility] via [place]. CSW contacted patient's husband Katherine [name] and made him aware of above. Katherine [name] requested a phone call from the MD. [place] sent MD a message making him aware that patient's husband would like to talk to him. Please reconsult if future social work needs arise. CSW signing off.      Final next level of care: [place] Barriers to Discharge: Barriers Resolved   Patient Goals and CMS Choice        Discharge Placement   Existing PASRR number confirmed : 08/22/18          Patient chooses bed at: [facility] Patient to be transferred to facility by: [facility] Name of family member notified: Patient's husband Katherine [name] is aware of D/C today. Patient and family notified of of transfer: 08/26/18  Discharge Plan and Services In-house Referral: Clinical Social Work Discharge Planning Services: CM Consult                                 Social Determinants of Health (SDOH) Interventions     Readmission Risk Interventions Readmission Risk Prevention Plan 07/16/2018 04/29/2018 04/29/2018  Transportation Screening Complete Complete Complete  Medication Review Press photographer) Complete Complete Complete  PCP or Specialist appointment within 3-5 days of discharge Complete Not Complete -  PCP/Specialist Appt Not Complete comments - Patient to return to SNF, and they will make  appointments  -  [place] or Home Care Consult Complete (No Data) -  SW Recovery Care/Counseling Consult Not Complete (No Data) -  SW Consult Not Complete Comments na - -  Palliative Care Screening Not Applicable Complete -  Medication Reconcilation ([place] Complete Complete -  Some recent data might be hidden

## 2018-08-27 DIAGNOSIS — F324 Major depressive disorder, single episode, in partial remission: Secondary | ICD-10-CM | POA: Diagnosis not present

## 2018-08-27 DIAGNOSIS — N183 Chronic kidney disease, stage 3 (moderate): Secondary | ICD-10-CM | POA: Diagnosis not present

## 2018-08-27 DIAGNOSIS — I509 Heart failure, unspecified: Secondary | ICD-10-CM | POA: Diagnosis not present

## 2018-09-03 DIAGNOSIS — Z9981 Dependence on supplemental oxygen: Secondary | ICD-10-CM | POA: Diagnosis not present

## 2018-09-03 DIAGNOSIS — J449 Chronic obstructive pulmonary disease, unspecified: Secondary | ICD-10-CM | POA: Diagnosis not present

## 2018-09-03 DIAGNOSIS — J9620 Acute and chronic respiratory failure, unspecified whether with hypoxia or hypercapnia: Secondary | ICD-10-CM | POA: Diagnosis not present

## 2018-09-03 DIAGNOSIS — R5381 Other malaise: Secondary | ICD-10-CM | POA: Diagnosis not present

## 2018-09-06 ENCOUNTER — Emergency Department: Payer: Medicare HMO

## 2018-09-06 ENCOUNTER — Encounter: Payer: Self-pay | Admitting: Emergency Medicine

## 2018-09-06 ENCOUNTER — Inpatient Hospital Stay
Admission: EM | Admit: 2018-09-06 | Discharge: 2018-09-09 | DRG: 689 | Disposition: A | Discharge status: Skilled Nursing Facility | Payer: Medicare HMO | Attending: Specialist | Admitting: Specialist

## 2018-09-06 ENCOUNTER — Other Ambulatory Visit: Payer: Self-pay

## 2018-09-06 DIAGNOSIS — Z79899 Other long term (current) drug therapy: Secondary | ICD-10-CM

## 2018-09-06 DIAGNOSIS — Z03818 Encounter for observation for suspected exposure to other biological agents ruled out: Secondary | ICD-10-CM | POA: Diagnosis not present

## 2018-09-06 DIAGNOSIS — Z91030 Bee allergy status: Secondary | ICD-10-CM

## 2018-09-06 DIAGNOSIS — Z87891 Personal history of nicotine dependence: Secondary | ICD-10-CM

## 2018-09-06 DIAGNOSIS — M1712 Unilateral primary osteoarthritis, left knee: Secondary | ICD-10-CM | POA: Diagnosis present

## 2018-09-06 DIAGNOSIS — R4701 Aphasia: Secondary | ICD-10-CM | POA: Diagnosis not present

## 2018-09-06 DIAGNOSIS — Z96651 Presence of right artificial knee joint: Secondary | ICD-10-CM | POA: Diagnosis present

## 2018-09-06 DIAGNOSIS — Z832 Family history of diseases of the blood and blood-forming organs and certain disorders involving the immune mechanism: Secondary | ICD-10-CM | POA: Diagnosis not present

## 2018-09-06 DIAGNOSIS — G4733 Obstructive sleep apnea (adult) (pediatric): Secondary | ICD-10-CM | POA: Diagnosis present

## 2018-09-06 DIAGNOSIS — M109 Gout, unspecified: Secondary | ICD-10-CM | POA: Diagnosis present

## 2018-09-06 DIAGNOSIS — Z811 Family history of alcohol abuse and dependence: Secondary | ICD-10-CM

## 2018-09-06 DIAGNOSIS — R410 Disorientation, unspecified: Secondary | ICD-10-CM | POA: Insufficient documentation

## 2018-09-06 DIAGNOSIS — N183 Chronic kidney disease, stage 3 (moderate): Secondary | ICD-10-CM | POA: Diagnosis not present

## 2018-09-06 DIAGNOSIS — E559 Vitamin D deficiency, unspecified: Secondary | ICD-10-CM | POA: Diagnosis present

## 2018-09-06 DIAGNOSIS — J449 Chronic obstructive pulmonary disease, unspecified: Secondary | ICD-10-CM | POA: Diagnosis present

## 2018-09-06 DIAGNOSIS — K219 Gastro-esophageal reflux disease without esophagitis: Secondary | ICD-10-CM | POA: Diagnosis present

## 2018-09-06 DIAGNOSIS — I469 Cardiac arrest, cause unspecified: Secondary | ICD-10-CM | POA: Diagnosis not present

## 2018-09-06 DIAGNOSIS — Z87440 Personal history of urinary (tract) infections: Secondary | ICD-10-CM

## 2018-09-06 DIAGNOSIS — N390 Urinary tract infection, site not specified: Principal | ICD-10-CM | POA: Diagnosis present

## 2018-09-06 DIAGNOSIS — B9620 Unspecified Escherichia coli [E. coli] as the cause of diseases classified elsewhere: Secondary | ICD-10-CM | POA: Diagnosis present

## 2018-09-06 DIAGNOSIS — R4182 Altered mental status, unspecified: Secondary | ICD-10-CM

## 2018-09-06 DIAGNOSIS — I959 Hypotension, unspecified: Secondary | ICD-10-CM | POA: Diagnosis not present

## 2018-09-06 DIAGNOSIS — Z7902 Long term (current) use of antithrombotics/antiplatelets: Secondary | ICD-10-CM

## 2018-09-06 DIAGNOSIS — J948 Other specified pleural conditions: Secondary | ICD-10-CM | POA: Diagnosis not present

## 2018-09-06 DIAGNOSIS — R5381 Other malaise: Secondary | ICD-10-CM | POA: Diagnosis not present

## 2018-09-06 DIAGNOSIS — E1122 Type 2 diabetes mellitus with diabetic chronic kidney disease: Secondary | ICD-10-CM | POA: Diagnosis not present

## 2018-09-06 DIAGNOSIS — Z8249 Family history of ischemic heart disease and other diseases of the circulatory system: Secondary | ICD-10-CM

## 2018-09-06 DIAGNOSIS — Z7401 Bed confinement status: Secondary | ICD-10-CM | POA: Diagnosis not present

## 2018-09-06 DIAGNOSIS — F329 Major depressive disorder, single episode, unspecified: Secondary | ICD-10-CM | POA: Diagnosis present

## 2018-09-06 DIAGNOSIS — I130 Hypertensive heart and chronic kidney disease with heart failure and stage 1 through stage 4 chronic kidney disease, or unspecified chronic kidney disease: Secondary | ICD-10-CM | POA: Diagnosis present

## 2018-09-06 DIAGNOSIS — I5032 Chronic diastolic (congestive) heart failure: Secondary | ICD-10-CM | POA: Diagnosis present

## 2018-09-06 DIAGNOSIS — I2510 Atherosclerotic heart disease of native coronary artery without angina pectoris: Secondary | ICD-10-CM | POA: Diagnosis present

## 2018-09-06 DIAGNOSIS — Z20828 Contact with and (suspected) exposure to other viral communicable diseases: Secondary | ICD-10-CM | POA: Diagnosis not present

## 2018-09-06 DIAGNOSIS — F419 Anxiety disorder, unspecified: Secondary | ICD-10-CM | POA: Diagnosis present

## 2018-09-06 DIAGNOSIS — M7989 Other specified soft tissue disorders: Secondary | ICD-10-CM | POA: Diagnosis not present

## 2018-09-06 DIAGNOSIS — M25462 Effusion, left knee: Secondary | ICD-10-CM | POA: Diagnosis not present

## 2018-09-06 DIAGNOSIS — E785 Hyperlipidemia, unspecified: Secondary | ICD-10-CM | POA: Diagnosis present

## 2018-09-06 DIAGNOSIS — E1140 Type 2 diabetes mellitus with diabetic neuropathy, unspecified: Secondary | ICD-10-CM | POA: Diagnosis present

## 2018-09-06 DIAGNOSIS — M25562 Pain in left knee: Secondary | ICD-10-CM | POA: Diagnosis not present

## 2018-09-06 DIAGNOSIS — G9341 Metabolic encephalopathy: Secondary | ICD-10-CM | POA: Diagnosis present

## 2018-09-06 DIAGNOSIS — Z888 Allergy status to other drugs, medicaments and biological substances status: Secondary | ICD-10-CM

## 2018-09-06 DIAGNOSIS — E119 Type 2 diabetes mellitus without complications: Secondary | ICD-10-CM

## 2018-09-06 DIAGNOSIS — M2550 Pain in unspecified joint: Secondary | ICD-10-CM | POA: Diagnosis not present

## 2018-09-06 DIAGNOSIS — M79605 Pain in left leg: Secondary | ICD-10-CM | POA: Diagnosis not present

## 2018-09-06 DIAGNOSIS — G9340 Encephalopathy, unspecified: Secondary | ICD-10-CM | POA: Diagnosis not present

## 2018-09-06 DIAGNOSIS — M25561 Pain in right knee: Secondary | ICD-10-CM | POA: Diagnosis not present

## 2018-09-06 DIAGNOSIS — I700 Atherosclerosis of aorta: Secondary | ICD-10-CM | POA: Diagnosis not present

## 2018-09-06 DIAGNOSIS — R4781 Slurred speech: Secondary | ICD-10-CM | POA: Diagnosis not present

## 2018-09-06 DIAGNOSIS — G459 Transient cerebral ischemic attack, unspecified: Secondary | ICD-10-CM | POA: Diagnosis not present

## 2018-09-06 DIAGNOSIS — G40909 Epilepsy, unspecified, not intractable, without status epilepticus: Secondary | ICD-10-CM | POA: Diagnosis present

## 2018-09-06 DIAGNOSIS — I10 Essential (primary) hypertension: Secondary | ICD-10-CM | POA: Diagnosis not present

## 2018-09-06 LAB — CBC WITH DIFFERENTIAL/PLATELET
Abs Immature Granulocytes: 0.01 10*3/uL (ref 0.00–0.07)
Basophils Absolute: 0 10*3/uL (ref 0.0–0.1)
Basophils Relative: 1 %
Eosinophils Absolute: 0.4 10*3/uL (ref 0.0–0.5)
Eosinophils Relative: 8 %
HCT: 27 % — ABNORMAL LOW (ref 36.0–46.0)
Hemoglobin: 8.6 g/dL — ABNORMAL LOW (ref 12.0–15.0)
Immature Granulocytes: 0 %
Lymphocytes Relative: 23 %
Lymphs Abs: 1.2 10*3/uL (ref 0.7–4.0)
MCH: 26.8 pg (ref 26.0–34.0)
MCHC: 31.9 g/dL (ref 30.0–36.0)
MCV: 84.1 fL (ref 80.0–100.0)
Monocytes Absolute: 0.6 10*3/uL (ref 0.1–1.0)
Monocytes Relative: 11 %
Neutro Abs: 3 10*3/uL (ref 1.7–7.7)
Neutrophils Relative %: 57 %
Platelets: 380 10*3/uL (ref 150–400)
RBC: 3.21 MIL/uL — ABNORMAL LOW (ref 3.87–5.11)
RDW: 21 % — ABNORMAL HIGH (ref 11.5–15.5)
WBC: 5.2 10*3/uL (ref 4.0–10.5)
nRBC: 0 % (ref 0.0–0.2)

## 2018-09-06 LAB — URINALYSIS, COMPLETE (UACMP) WITH MICROSCOPIC
Bilirubin Urine: NEGATIVE
Glucose, UA: NEGATIVE mg/dL
Hgb urine dipstick: NEGATIVE
Ketones, ur: NEGATIVE mg/dL
Nitrite: NEGATIVE
Protein, ur: NEGATIVE mg/dL
Specific Gravity, Urine: 1.014 (ref 1.005–1.030)
WBC, UA: >50 WBC/hpf — ABNORMAL HIGH (ref 0–5)
pH: 5 (ref 5.0–8.0)

## 2018-09-06 LAB — AMMONIA: Ammonia: <9 umol/L — ABNORMAL LOW (ref 9–35)

## 2018-09-06 LAB — COMPREHENSIVE METABOLIC PANEL
ALT: 22 U/L (ref 0–44)
AST: 36 U/L (ref 15–41)
Albumin: 1.9 g/dL — ABNORMAL LOW (ref 3.5–5.0)
Alkaline Phosphatase: 154 U/L — ABNORMAL HIGH (ref 38–126)
Anion gap: 7 (ref 5–15)
BUN: 21 mg/dL (ref 8–23)
CO2: 27 mmol/L (ref 22–32)
Calcium: 9.4 mg/dL (ref 8.9–10.3)
Chloride: 105 mmol/L (ref 98–111)
Creatinine, Ser: 1.62 mg/dL — ABNORMAL HIGH (ref 0.44–1.00)
GFR calc Af Amer: 35 mL/min — ABNORMAL LOW (ref >60)
GFR calc non Af Amer: 31 mL/min — ABNORMAL LOW (ref >60)
Glucose, Bld: 92 mg/dL (ref 70–99)
Potassium: 4.5 mmol/L (ref 3.5–5.1)
Sodium: 139 mmol/L (ref 135–145)
Total Bilirubin: 0.5 mg/dL (ref 0.3–1.2)
Total Protein: 5.7 g/dL — ABNORMAL LOW (ref 6.5–8.1)

## 2018-09-06 LAB — PROTIME-INR
INR: 1 (ref 0.8–1.2)
Prothrombin Time: 13.1 seconds (ref 11.4–15.2)

## 2018-09-06 LAB — SARS CORONAVIRUS 2 BY RT PCR (HOSPITAL ORDER, PERFORMED IN ~~LOC~~ HOSPITAL LAB): SARS Coronavirus 2: NEGATIVE

## 2018-09-06 LAB — ETHANOL: Alcohol, Ethyl (B): <10 mg/dL (ref <10)

## 2018-09-06 MED ORDER — MUPIROCIN 2 % EX OINT
1.0000 "application " | TOPICAL_OINTMENT | Freq: Two times a day (BID) | CUTANEOUS | Status: DC
  Administered 2018-09-06 – 2018-09-09 (×6): 1 via NASAL
  Filled 2018-09-06: qty 22

## 2018-09-06 MED ORDER — ADULT MULTIVITAMIN W/MINERALS CH
1.0000 | ORAL_TABLET | Freq: Every day | ORAL | Status: DC
  Administered 2018-09-07 – 2018-09-09 (×3): 1 via ORAL
  Filled 2018-09-06 (×3): qty 1

## 2018-09-06 MED ORDER — SODIUM CHLORIDE 0.9% FLUSH
10.0000 mL | Freq: Two times a day (BID) | INTRAVENOUS | Status: DC
  Administered 2018-09-06 – 2018-09-08 (×5): 10 mL

## 2018-09-06 MED ORDER — DOCUSATE SODIUM 100 MG PO CAPS
100.0000 mg | ORAL_CAPSULE | Freq: Two times a day (BID) | ORAL | Status: DC
  Administered 2018-09-06 – 2018-09-07 (×2): 100 mg via ORAL
  Filled 2018-09-06 (×5): qty 1

## 2018-09-06 MED ORDER — PIPERACILLIN-TAZOBACTAM 3.375 G IVPB
3.3750 g | Freq: Three times a day (TID) | INTRAVENOUS | Status: DC
  Administered 2018-09-06 – 2018-09-09 (×8): 3.375 g via INTRAVENOUS
  Filled 2018-09-06 (×8): qty 50

## 2018-09-06 MED ORDER — IPRATROPIUM BROMIDE 0.03 % NA SOLN
2.0000 | Freq: Two times a day (BID) | NASAL | Status: DC
  Administered 2018-09-06 – 2018-09-09 (×6): 2 via NASAL
  Filled 2018-09-06: qty 30

## 2018-09-06 MED ORDER — PIPERACILLIN-TAZOBACTAM 3.375 G IVPB 30 MIN
3.3750 g | Freq: Once | INTRAVENOUS | Status: AC
  Administered 2018-09-06: 14:00:00 3.375 g via INTRAVENOUS
  Filled 2018-09-06: qty 50

## 2018-09-06 MED ORDER — IPRATROPIUM-ALBUTEROL 0.5-2.5 (3) MG/3ML IN SOLN
3.0000 mL | Freq: Three times a day (TID) | RESPIRATORY_TRACT | Status: DC
  Administered 2018-09-06 – 2018-09-09 (×8): 3 mL via RESPIRATORY_TRACT
  Filled 2018-09-06 (×9): qty 3

## 2018-09-06 MED ORDER — SODIUM CHLORIDE 0.9% FLUSH: 10.0000 mL | INTRAVENOUS | Status: DC | PRN

## 2018-09-06 MED ORDER — LORAZEPAM 2 MG/ML IJ SOLN: 1.0000 mg | Freq: Every day | INTRAMUSCULAR | Status: DC | PRN

## 2018-09-06 MED ORDER — VITAMIN D3 25 MCG (1000 UNIT) PO TABS
1000.0000 [IU] | ORAL_TABLET | Freq: Every day | ORAL | Status: DC
  Administered 2018-09-07 – 2018-09-09 (×3): 1000 [IU] via ORAL
  Filled 2018-09-06 (×6): qty 1

## 2018-09-06 MED ORDER — HYDRALAZINE HCL 50 MG PO TABS
25.0000 mg | ORAL_TABLET | Freq: Three times a day (TID) | ORAL | Status: DC
  Administered 2018-09-06 – 2018-09-09 (×6): 25 mg via ORAL
  Filled 2018-09-06 (×8): qty 1

## 2018-09-06 MED ORDER — LORATADINE 10 MG PO TABS
10.0000 mg | ORAL_TABLET | Freq: Every day | ORAL | Status: DC
  Administered 2018-09-06 – 2018-09-08 (×3): 10 mg via ORAL
  Filled 2018-09-06 (×3): qty 1

## 2018-09-06 MED ORDER — GABAPENTIN 300 MG PO CAPS
300.0000 mg | ORAL_CAPSULE | Freq: Every day | ORAL | Status: DC
  Administered 2018-09-06 – 2018-09-08 (×3): 300 mg via ORAL
  Filled 2018-09-06 (×3): qty 1

## 2018-09-06 MED ORDER — LINAGLIPTIN 5 MG PO TABS
5.0000 mg | ORAL_TABLET | Freq: Every day | ORAL | Status: DC
  Administered 2018-09-07 – 2018-09-09 (×3): 5 mg via ORAL
  Filled 2018-09-06 (×3): qty 1

## 2018-09-06 MED ORDER — FLUOXETINE HCL 20 MG PO CAPS
40.0000 mg | ORAL_CAPSULE | Freq: Every day | ORAL | Status: DC
  Administered 2018-09-07 – 2018-09-09 (×3): 40 mg via ORAL
  Filled 2018-09-06 (×3): qty 2

## 2018-09-06 MED ORDER — ATORVASTATIN CALCIUM 20 MG PO TABS
80.0000 mg | ORAL_TABLET | Freq: Every evening | ORAL | Status: DC
  Administered 2018-09-06 – 2018-09-09 (×4): 80 mg via ORAL
  Filled 2018-09-06 (×4): qty 4

## 2018-09-06 MED ORDER — COLCHICINE 0.6 MG PO TABS
0.6000 mg | ORAL_TABLET | Freq: Every day | ORAL | Status: DC | PRN
  Filled 2018-09-06: qty 1

## 2018-09-06 MED ORDER — DICLOFENAC SODIUM 1 % TD GEL
2.0000 g | Freq: Two times a day (BID) | TRANSDERMAL | Status: DC | PRN
  Administered 2018-09-06 – 2018-09-08 (×2): 2 g via TOPICAL
  Filled 2018-09-06: qty 100

## 2018-09-06 MED ORDER — VITAMIN B-12 1000 MCG PO TABS
1000.0000 ug | ORAL_TABLET | Freq: Every day | ORAL | Status: DC
  Administered 2018-09-07 – 2018-09-09 (×3): 1000 ug via ORAL
  Filled 2018-09-06 (×3): qty 1

## 2018-09-06 MED ORDER — MECLIZINE HCL 25 MG PO TABS
25.0000 mg | ORAL_TABLET | Freq: Two times a day (BID) | ORAL | Status: DC | PRN
  Filled 2018-09-06: qty 1

## 2018-09-06 MED ORDER — SODIUM CHLORIDE 0.9 % IV SOLN
INTRAVENOUS | Status: DC | PRN
  Administered 2018-09-07: 06:00:00 40 mL via INTRAVENOUS
  Administered 2018-09-08 – 2018-09-09 (×2): 500 mL via INTRAVENOUS

## 2018-09-06 MED ORDER — PANTOPRAZOLE SODIUM 40 MG PO TBEC
40.0000 mg | DELAYED_RELEASE_TABLET | Freq: Every day | ORAL | Status: DC
  Administered 2018-09-07 – 2018-09-09 (×3): 40 mg via ORAL
  Filled 2018-09-06 (×3): qty 1

## 2018-09-06 MED ORDER — ACETAMINOPHEN 325 MG PO TABS
650.0000 mg | ORAL_TABLET | Freq: Four times a day (QID) | ORAL | Status: DC
  Administered 2018-09-06 – 2018-09-09 (×12): 650 mg via ORAL
  Filled 2018-09-06 (×13): qty 2

## 2018-09-06 MED ORDER — LEVETIRACETAM ER 500 MG PO TB24
500.0000 mg | ORAL_TABLET | Freq: Every day | ORAL | Status: DC
  Administered 2018-09-07 – 2018-09-09 (×3): 500 mg via ORAL
  Filled 2018-09-06 (×3): qty 1

## 2018-09-06 MED ORDER — CLOPIDOGREL BISULFATE 75 MG PO TABS
75.0000 mg | ORAL_TABLET | Freq: Every day | ORAL | Status: DC
  Administered 2018-09-07 – 2018-09-09 (×3): 75 mg via ORAL
  Filled 2018-09-06 (×3): qty 1

## 2018-09-06 MED ORDER — ALBUTEROL SULFATE (2.5 MG/3ML) 0.083% IN NEBU: 2.5000 mg | INHALATION_SOLUTION | RESPIRATORY_TRACT | Status: DC | PRN

## 2018-09-06 NOTE — H&P (Signed)
[place] [facility] [place] NAME: Katherine [name]    MR#:  [id number]  DATE OF BIRTH:  1940/11/08  DATE OF ADMISSION:  09/06/2018  PRIMARY CARE PHYSICIAN: Valerie Roys, DO   REQUESTING/REFERRING PHYSICIAN: Charlotte Crumb, MD  CHIEF COMPLAINT:   Chief Complaint  Patient presents with   Altered Mental Status    HISTORY OF PRESENT ILLNESS:   78 y.o. female with pertinent past medical history of CHF, CKD stage III, COPD, diabetes mellitus, GERD, hypertension, hyperlipidemia, seizure, stroke, tobacco abuse, anxiety and depression presenting to the ED from [place] with altered mental status and aphasia.  Patient is nonverbal so history mostly obtained from patient's chart and ED reports.  Per ED notes, patient reportedly was normal last night and this morning at around 8 AM nursing staff went to check on her and found her to be confused with difficulty expressing herself.  On review of her chart, patient had subsequent admission with similar symptoms concerning for stroke and at one point received IV thrombolytics.  Stroke work-up in the past has been unremarkable.  She had routine EEG on 10/2017 with no evidence of left to form discharges or abnormality noted.  She was started on Keppra for possible seizure. She has hx of recurrent UTIs with these admission and upon treatment of her UTIs her symptoms would resolved.  On arrival to the ED, she was noted with low grade fever, blood pressure 110/86 mm Hg and pulse rate 114 beats/min. There were no focal neurological deficits; she was awake but appeared encephalopathic.  Initial pertinent labs showed kidney functions at baseline, slightly elevated AST 57, WBC 8.2.  Chest x-ray shows no active cardiopulmonary process. Noncontrast CT head negative for acute intracranial abnormality.  Patient was recently diagnosed with UTI with positive cultures for ESBL E. coli.  She was therefore started on Zosyn  as it sensitive to Zosyn pending urine and blood cultures.  Patient is admitted under hospitalist service for further management.  PAST MEDICAL HISTORY:   Past Medical History:  Diagnosis Date   Angioedema    Anxiety and depression    CHF (congestive heart failure) (HCC)    Chronic constipation    Chronic kidney disease    stage 3   COPD (chronic obstructive pulmonary disease) (HCC)    Diabetes mellitus without complication (HCC)    Gallstones    GERD (gastroesophageal reflux disease)    Hypertension    Left ventricular hypertrophy    Osteoarthritis    Prurigo nodularis    Seizures (HCC)    Stroke ([place])    Tobacco abuse    Vitamin D deficiency disease     PAST SURGICAL HISTORY:   Past Surgical History:  Procedure Laterality Date   CHOLECYSTECTOMY     POLYPECTOMY  11/2011   vocal cord   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   Procedure: TOTAL KNEE ARTHROPLASTY;  Surgeon: Hessie Knows, MD;  Location: [facility] ORS;  Service: Orthopedics;  Laterality: Right;    SOCIAL HISTORY:   Social History   Tobacco Use   Smoking status: Former Smoker    Packs/day: 0.50    Years: 60.00    Pack years: 30.00    Types: Cigarettes   Smokeless tobacco: Never Used   Tobacco comment: quite 60mo ago-03/26/18  Substance Use Topics   Alcohol use: No    Alcohol/week: 0.0 standard drinks    Comment: rare    FAMILY HISTORY:   Family History  Problem Relation Age of Onset   Alcohol abuse Mother    Sickle cell anemia Daughter    Hypertension Son    Cancer Neg Hx    COPD Neg Hx    Diabetes Neg Hx    Heart disease Neg Hx    Stroke Neg Hx     DRUG ALLERGIES:   Allergies  Allergen Reactions   Bee Venom Swelling   Enalapril Maleate Swelling    REVIEW OF SYSTEMS:   ROS Unable to obtain from patient due to current altered mental status MEDICATIONS AT HOME:   Prior to Admission medications   Medication Sig Start Date End Date Taking?  Authorizing Provider  atorvastatin (LIPITOR) 80 MG tablet Take 80 mg by mouth every evening.    Yes [provider]  cetirizine (ZYRTEC) 10 MG tablet Take 5 mg by mouth at bedtime.   Yes [provider]  cholecalciferol (VITAMIN D) 1000 units tablet Take 1,000 Units by mouth daily.   Yes [provider]  clopidogrel (PLAVIX) 75 MG tablet Take 75 mg by mouth daily.   Yes [provider]  diclofenac sodium (VOLTAREN) 1 % GEL Apply 2 g topically every 12 (twelve) hours as needed (for left knee pain).    Yes [provider]  docusate sodium (COLACE) 100 MG capsule Take 100 mg by mouth 2 (two) times daily.   Yes [provider]  FLUoxetine (PROZAC) 40 MG capsule Take 40 mg by mouth daily.    Yes [provider]  Fluticasone-Umeclidin-Vilant 100-62.5-25 MCG/INH AEPB Inhale 1 puff into the lungs daily.   Yes [provider]  gabapentin (NEURONTIN) 300 MG capsule Take 300 mg by mouth at bedtime.   Yes [provider]  hydrALAZINE (APRESOLINE) 25 MG tablet Take 25 mg by mouth 3 (three) times daily. 06/19/18  Yes [provider]  ipratropium (ATROVENT) 0.03 % nasal spray Place 2 sprays into both nostrils 2 (two) times daily.    Yes [provider]  levETIRAcetam (KEPPRA XR) 500 MG 24 hr tablet Take 1 tablet (500 mg total) by mouth daily. 11/19/17  Yes Venancio Poisson, NP  linagliptin (TRADJENTA) 5 MG TABS tablet Take 5 mg by mouth daily.   Yes [provider]  meclizine (ANTIVERT) 25 MG tablet Take 25 mg by mouth every 12 (twelve) hours as needed for dizziness.    Yes [provider]  Multiple Vitamin (MULTIVITAMIN WITH MINERALS) TABS tablet Take 1 tablet by mouth daily.   Yes [provider]  mupirocin ointment (BACTROBAN) 2 % Place 1 application into the nose 2 (two) times daily. 06/13/18  Yes Wieting, Richard, MD  pantoprazole (PROTONIX) 40 MG tablet Take 1 tablet (40 mg total) by  mouth daily. 04/29/18  Yes Bettey Costa, MD  vitamin B-12 1000 MCG tablet Take 1 tablet (1,000 mcg total) by mouth daily. 04/30/18  Yes Bettey Costa, MD  acetaminophen (TYLENOL) 650 MG CR tablet Take 650 mg by mouth 3 (three) times daily.     [provider]  albuterol (ACCUNEB) 0.63 MG/3ML nebulizer solution Take 3 mLs by nebulization every 8 (eight) hours as needed for wheezing.     [provider]  albuterol (PROVENTIL HFA;VENTOLIN HFA) 108 (90 Base) MCG/ACT inhaler Inhale 2 puffs into the lungs every 4 (four) hours as needed for wheezing or shortness of breath.    [provider]  colchicine 0.6 MG tablet Take 1 tablet (0.6 mg total) by mouth daily. 06/14/18   Wieting, Richard,  MD  ipratropium-albuterol (DUONEB) 0.5-2.5 (3) MG/3ML SOLN Take 3 mLs by nebulization 3 (three) times daily. 07/17/18   Gladstone Lighter, MD  LORazepam (ATIVAN) 2 MG/ML injection Inject 0.5 mLs (1 mg total) into the muscle daily as needed for seizure. 06/13/18   Loletha Grayer, MD  nitroGLYCERIN (NITROSTAT) 0.4 MG SL tablet Place 0.4 mg under the tongue as needed. 08/07/18   [provider]  nystatin (MYCOSTATIN) 100000 UNIT/ML suspension Take 5 mLs (500,000 Units total) by mouth 4 (four) times daily. Patient not taking: Reported on 09/06/2018 06/13/18   Loletha Grayer, MD      VITAL SIGNS:  Blood pressure 140/86, pulse 87, temperature 98.3 F (36.8 C), temperature source Oral, resp. rate (!) 24, height 5\' 6"  (1.676 m), weight 105.7 kg, SpO2 100 %.  PHYSICAL EXAMINATION:   Physical Exam  GENERAL:  78 y.o.-year-old patient lying in the bed with no acute distress.  EYES: Pupils equal, round, reactive to light and accommodation. No scleral icterus. Extraocular muscles intact.  HEENT: Head atraumatic, normocephalic. Oropharynx and nasopharynx clear.  NECK:  Supple, no jugular venous distention. No thyroid enlargement, no tenderness.  LUNGS: Normal breath sounds bilaterally, no wheezing,  rales,rhonchi or crepitation. No use of accessory muscles of respiration.  CARDIOVASCULAR: S1, S2 normal. No murmurs, rubs, or gallops.  ABDOMEN: Soft, nontender, nondistended. Bowel sounds present. No organomegaly or mass.  EXTREMITIES: No pedal edema, cyanosis, or clubbing. No rash or lesions. + pedal pulses MUSCULOSKELETAL: Normal bulk, and power was 5+ grip and elbow, knee, and ankle flexion and extension bilaterally.  NEUROLOGIC:Alert and oriented x 3. CN 2-12 intact.  Speech almost incomprehensible sensation to light touch and cold stimuli intact bilaterally. Babinski is downgoing. DTR's (biceps, patellar, and achilles) 1+ and symmetric throughout. Gait not tested due to safety concern. PSYCHIATRIC: Not nods appropriately to orientation questions SKIN: No obvious rash, lesion, or ulcer.   DATA REVIEWED:  LABORATORY PANEL:   CBC Recent Labs  Lab 09/06/18 1212  WBC 5.2  HGB 8.6*  HCT 27.0*  PLT 380   ------------------------------------------------------------------------------------------------------------------  Chemistries  Recent Labs  Lab 09/06/18 1358  NA 139  K 4.5  CL 105  CO2 27  GLUCOSE 92  BUN 21  CREATININE 1.62*  CALCIUM 9.4  AST 36  ALT 22  ALKPHOS 154*  BILITOT 0.5   ------------------------------------------------------------------------------------------------------------------  Cardiac Enzymes No results for input(s): TROPONINI in the last 168 hours. ------------------------------------------------------------------------------------------------------------------  RADIOLOGY:  Ct Head Wo Contrast  Result Date: 09/06/2018 CLINICAL DATA:  Encephalopathy due to urinary tract infection with confusion. EXAM: CT HEAD WITHOUT CONTRAST TECHNIQUE: Contiguous axial images were obtained from the base of the skull through the vertex without intravenous contrast. COMPARISON:  August 21, 2018 FINDINGS: Brain: No evidence of acute infarction, hemorrhage,  hydrocephalus, extra-axial collection or mass lesion/mass effect. Moderate brain parenchymal volume loss and deep white matter microangiopathy. Vascular: Calcific atherosclerotic disease of the intracranial arteries. Skull: Partial opacification of the right mastoid air cells. Mild mucosal thickening of the ethmoid sinuses. Sinuses/Orbits: No acute finding. Other: None. IMPRESSION: 1. No acute intracranial abnormality. 2. Moderate brain parenchymal atrophy and chronic microvascular disease. Electronically Signed   By: Fidela Salisbury M.D.   On: 09/06/2018 12:59   Dg Chest Port 1 View  Result Date: 09/06/2018 CLINICAL DATA:  History of stroke. EXAM: PORTABLE CHEST 1 VIEW COMPARISON:  August 21, 2018 FINDINGS: Cardiomediastinal silhouette is normal. Mediastinal contours appear intact. Calcific atherosclerotic disease of the aorta. There is no evidence of focal airspace  consolidation, pleural effusion or pneumothorax. Bilateral pleural/subpleural thickening. Osseous structures are without acute abnormality. Soft tissues are grossly normal. IMPRESSION: 1. No active disease. 2. Bilateral pleural/subpleural thickening. Electronically Signed   By: Fidela Salisbury M.D.   On: 09/06/2018 12:51    EKG:  EKG: normal EKG, normal sinus rhythm, unchanged from previous tracings. Vent. rate 94 BPM PR interval * ms QRS duration 77 ms QT/QTc 350/438 ms P-R-T axes 64 -14 49 IMPRESSION AND PLAN:   79 y.o. female with pertinent past medical history of CHF, CKD stage III, COPD, diabetes mellitus, GERD, hypertension, hyperlipidemia, seizure, stroke, tobacco abuse, anxiety and depression presenting to the ED from [place] with altered mental status and aphasia  1.  Altered mental status -likely due to UTI, and recently diagnosed with UTI with cultures positive for ESBL E. Coli -Admit to MedSurg unit -CT head reviewed shows no acute intracranial abnormality.  2. Aphasia -recurrent episodes in the past  but no identifiable cause.  Extensive stroke work-up negative.  Per neurology unlikely related to a vascular etiology given multiple negative neurological work-up with no focal deficits -We will continue current treatment -No further work-up recommended -Neurology input appreciated  3.  UTI -UA positive for UTI -Urine culture pending -Start Zosyn due to recent ESBL E. Coli  4.  CKD stage III -BUN/creatinine at baseline -Continue to monitor labs  5. History of CVA -Continue Plavix  6. Seizure Disorder -Continue Keppra  7. COPD - stable -*Continue supplemental oxygen -PRN duo nebs +  8. HLD + Goal LDL<70 - Atorvastatin 80mg  PO qhs  9. HTN  + Goal BP <130/80 -Hydralazine  10. Diabetes mellitus -Continue Tradjenta -Add sliding scale   All the records are reviewed and case discussed with ED provider. Management plans discussed with the patient, family and they are in agreement.  CODE STATUS: FULL  TOTAL TIME TAKING CARE OF THIS PATIENT: 50 minutes.    on 09/06/2018 at 4:49 PM  Rufina Falco, DNP, FNP-BC [facility] Nurse Practitioner Between 7am to 6pm - Pager [phone]  After 6pm go to [email or web address] - password [email or web address] [facility]  Office  [phone]  CC: Primary care physician; Valerie Roys, DO

## 2018-09-06 NOTE — ED Notes (Signed)
ED TO INPATIENT HANDOFF REPORT  ED Nurse Name and Phone #: Faye Ramsay  S Name/Age/Gender Katherine [name] 78 y.o. female Room/Bed: ED26A/ED26A  Code Status   Code Status: Full Code  Home/SNF/Other Skilled nursing facility Patient oriented to: situation Is this baseline? Yes   Triage Complete: Triage complete  Chief Complaint AMS   Triage Note PT arrives via ems from [facility] for reported AMS. Facility unable to articulate changes from baseline. Pt nonverbal at baseline.   Allergies Allergies  Allergen Reactions  . Bee Venom Swelling  . Enalapril Maleate Swelling    Level of Care/Admitting Diagnosis ED Disposition    ED Disposition Condition [facility] Area: [place] [[id number]]  Level of Care: Med-Surg [16]  Covid Evaluation: Confirmed COVID Negative  Diagnosis: UTI (urinary tract infection) [161096]  Admitting Physician: Eula Flax  Attending Physician: Rufina Falco ACHIENG [[id number]]  Estimated length of stay: past midnight tomorrow  Certification:: I certify this patient will need inpatient services for at least 2 midnights  PT Class (Do Not Modify): Inpatient [101]  PT Acc Code (Do Not Modify): Private [1]       B Medical/Surgery History Past Medical History:  Diagnosis Date  . Angioedema   . Anxiety and depression   . CHF (congestive heart failure) ([place])   . Chronic constipation   . Chronic kidney disease    stage 3  . COPD (chronic obstructive pulmonary disease) ([place])   . Diabetes mellitus without complication ([place])   . Gallstones   . GERD (gastroesophageal reflux disease)   . Hypertension   . Left ventricular hypertrophy   . Osteoarthritis   . Prurigo nodularis   . Seizures ([place])   . Stroke ([place])   . Tobacco abuse   . Vitamin D deficiency disease    Past Surgical History:  Procedure Laterality Date  . CHOLECYSTECTOMY    . 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 TOTAL KNEE  ARTHROPLASTY Right 02/08/2016   Procedure: TOTAL KNEE ARTHROPLASTY;  Surgeon: Hessie Knows, MD;  Location: [facility] ORS;  Service: Orthopedics;  Laterality: Right;     A IV Location/Drains/Wounds Patient Lines/Drains/Airways Status   Active Line/Drains/Airways    Name:   Placement date:   Placement time:   Site:   Days:   Midline Single Lumen 09/06/18 Midline Left Brachial 8 cm 0 cm   09/06/18    1315    Brachial   less than 1          Intake/Output Last 24 hours No intake or output data in the 24 hours ending 09/06/18 1507  Labs/Imaging Results for orders placed or performed during the hospital encounter of 09/06/18 (from the past 48 hour(s))  CBC with Differential     Status: Abnormal   Collection Time: 09/06/18 12:12 PM  Result Value Ref Range   WBC 5.2 4.0 - 10.5 K/uL   RBC 3.21 (L) 3.87 - 5.11 MIL/uL   Hemoglobin 8.6 (L) 12.0 - 15.0 g/dL   HCT 27.0 (L) 36.0 - 46.0 %   MCV 84.1 80.0 - 100.0 fL   MCH 26.8 26.0 - 34.0 pg   MCHC 31.9 30.0 - 36.0 g/dL   RDW 21.0 (H) 11.5 - 15.5 %   Platelets 380 150 - 400 K/uL   nRBC 0.0 0.0 - 0.2 %   Neutrophils Relative % 57 %   Neutro Abs 3.0 1.7 - 7.7 K/uL   Lymphocytes Relative 23 %  Lymphs Abs 1.2 0.7 - 4.0 K/uL   Monocytes Relative 11 %   Monocytes Absolute 0.6 0.1 - 1.0 K/uL   Eosinophils Relative 8 %   Eosinophils Absolute 0.4 0.0 - 0.5 K/uL   Basophils Relative 1 %   Basophils Absolute 0.0 0.0 - 0.1 K/uL   Immature Granulocytes 0 %   Abs Immature Granulocytes 0.01 0.00 - 0.07 K/uL    Comment: Performed at [facility], [address] [place], [place] [address]  SARS Coronavirus 2 (CEPHEID - Performed in [place] hospital lab), Hosp Order     Status: None   Collection Time: 09/06/18 12:22 PM   Specimen: Nasopharyngeal Swab  Result Value Ref Range   SARS Coronavirus 2 NEGATIVE NEGATIVE    Comment: (NOTE) If result is NEGATIVE SARS-CoV-2 target nucleic acids are NOT DETECTED. The SARS-CoV-2 RNA is generally  detectable in upper and lower  respiratory specimens during the acute phase of infection. The lowest  concentration of SARS-CoV-2 viral copies this assay can detect is 250  copies / mL. A negative result does not preclude SARS-CoV-2 infection  and should not be used as the sole basis for treatment or other  patient management decisions.  A negative result may occur with  improper specimen collection / handling, submission of specimen other  than nasopharyngeal swab, presence of viral mutation(s) within the  areas targeted by this assay, and inadequate number of viral copies  (<250 copies / mL). A negative result must be combined with clinical  observations, patient history, and epidemiological information. If result is POSITIVE SARS-CoV-2 target nucleic acids are DETECTED. The SARS-CoV-2 RNA is generally detectable in upper and lower  respiratory specimens dur ing the acute phase of infection.  Positive  results are indicative of active infection with SARS-CoV-2.  Clinical  correlation with patient history and other diagnostic information is  necessary to determine patient infection status.  Positive results do  not rule out bacterial infection or co-infection with other viruses. If result is PRESUMPTIVE POSTIVE SARS-CoV-2 nucleic acids MAY BE PRESENT.   A presumptive positive result was obtained on the submitted specimen  and confirmed on repeat testing.  While 2019 novel coronavirus  (SARS-CoV-2) nucleic acids may be present in the submitted sample  additional confirmatory testing may be necessary for epidemiological  and / or clinical management purposes  to differentiate between  SARS-CoV-2 and other Sarbecovirus currently known to infect humans.  If clinically indicated additional testing with an alternate test  methodology [phone]) is advised. The SARS-CoV-2 RNA is generally  detectable in upper and lower respiratory sp ecimens during the acute  phase of infection. The  expected result is Negative. Fact Sheet for Patients:  StrictlyIdeas.no Fact Sheet for Healthcare Providers: BankingDealers.co.za This test is not yet approved or cleared by the [place] FDA and has been authorized for detection and/or diagnosis of SARS-CoV-2 by FDA under an Emergency Use Authorization (EUA).  This EUA will remain in effect (meaning this test can be used) for the duration of the COVID-19 declaration under Section 564(b)(1) of the Act, 21 U.S.C. section 360bbb-3(b)(1), unless the authorization is terminated or revoked sooner. Performed at [facility], [address] [place], [place] [address]   Urinalysis, Complete w Microscopic     Status: Abnormal   Collection Time: 09/06/18 12:22 PM  Result Value Ref Range   Color, Urine YELLOW (A) YELLOW   APPearance CLOUDY (A) CLEAR   Specific Gravity, Urine 1.014 1.005 - 1.030  pH 5.0 5.0 - 8.0   Glucose, UA NEGATIVE NEGATIVE mg/dL   Hgb urine dipstick NEGATIVE NEGATIVE   Bilirubin Urine NEGATIVE NEGATIVE   Ketones, ur NEGATIVE NEGATIVE mg/dL   Protein, ur NEGATIVE NEGATIVE mg/dL   Nitrite NEGATIVE NEGATIVE   Leukocytes,Ua LARGE (A) NEGATIVE   RBC / HPF 0-5 0 - 5 RBC/hpf   WBC, UA >50 (H) 0 - 5 WBC/hpf   Bacteria, UA MANY (A) NONE SEEN   Squamous Epithelial / LPF 0-5 0 - 5   WBC Clumps PRESENT    Hyaline Casts, UA PRESENT     Comment: Performed at [facility], [address] [place], [place] [address]  Comprehensive metabolic panel     Status: Abnormal   Collection Time: 09/06/18  1:58 PM  Result Value Ref Range   Sodium 139 135 - 145 mmol/L   Potassium 4.5 3.5 - 5.1 mmol/L   Chloride 105 98 - 111 mmol/L   CO2 27 22 - 32 mmol/L   Glucose, Bld 92 70 - 99 mg/dL   BUN 21 8 - 23 mg/dL   Creatinine, Ser 1.62 (H) 0.44 - 1.00 mg/dL   Calcium 9.4 8.9 - 10.3 mg/dL   Total Protein 5.7 (L) 6.5 - 8.1 g/dL   Albumin 1.9 (L) 3.5 - 5.0 g/dL   AST 36 15 -  41 U/L   ALT 22 0 - 44 U/L   Alkaline Phosphatase 154 (H) 38 - 126 U/L   Total Bilirubin 0.5 0.3 - 1.2 mg/dL   GFR calc non Af Amer 31 (L) >60 mL/min   GFR calc Af Amer 35 (L) >60 mL/min   Anion gap 7 5 - 15    Comment: Performed at [facility], [address] [place], [place] [address]  Ammonia     Status: Abnormal   Collection Time: 09/06/18  1:58 PM  Result Value Ref Range   Ammonia <9 (L) 9 - 35 umol/L    Comment: Performed at [facility], [address] [place], [place] [address]  Protime-INR     Status: None   Collection Time: 09/06/18  1:58 PM  Result Value Ref Range   Prothrombin Time 13.1 11.4 - 15.2 seconds   INR 1.0 0.8 - 1.2    Comment: (NOTE) INR goal varies based on device and disease states. Performed at [facility], [address] [place], [place] [address]   Ethanol     Status: None   Collection Time: 09/06/18  1:58 PM  Result Value Ref Range   Alcohol, Ethyl (B) <10 <10 mg/dL    Comment: (NOTE) Lowest detectable limit for serum alcohol is 10 mg/dL. For medical purposes only. Performed at [facility], [place], [place] [address]    Ct Head Wo Contrast  Result Date: 09/06/2018 CLINICAL DATA:  Encephalopathy due to urinary tract infection with confusion. EXAM: CT HEAD WITHOUT CONTRAST TECHNIQUE: Contiguous axial images were obtained from the base of the skull through the vertex without intravenous contrast. COMPARISON:  August 21, 2018 FINDINGS: Brain: No evidence of acute infarction, hemorrhage, hydrocephalus, extra-axial collection or mass lesion/mass effect. Moderate brain parenchymal volume loss and deep white matter microangiopathy. Vascular: Calcific atherosclerotic disease of the intracranial arteries. Skull: Partial opacification of the right mastoid air cells. Mild mucosal thickening of the ethmoid sinuses. Sinuses/Orbits: No acute finding. Other: None. IMPRESSION: 1. No acute intracranial  abnormality. 2. Moderate brain parenchymal atrophy and chronic microvascular disease. Electronically Signed  By: Fidela Salisbury M.D.   On: 09/06/2018 12:59   Dg Chest Port 1 View  Result Date: 09/06/2018 CLINICAL DATA:  History of stroke. EXAM: PORTABLE CHEST 1 VIEW COMPARISON:  August 21, 2018 FINDINGS: Cardiomediastinal silhouette is normal. Mediastinal contours appear intact. Calcific atherosclerotic disease of the aorta. There is no evidence of focal airspace consolidation, pleural effusion or pneumothorax. Bilateral pleural/subpleural thickening. Osseous structures are without acute abnormality. Soft tissues are grossly normal. IMPRESSION: 1. No active disease. 2. Bilateral pleural/subpleural thickening. Electronically Signed   By: Fidela Salisbury M.D.   On: 09/06/2018 12:51    Pending Labs Unresulted Labs (From admission, onward)    Start     Ordered   09/06/18 1213  Urine culture  Once,   STAT     09/06/18 1212          Vitals/Pain Today's Vitals   09/06/18 1204 09/06/18 1205 09/06/18 1330 09/06/18 1400  BP: (!) 110/92  130/83 140/86  Pulse: 96  89 87  Resp: 20  (!) 24 (!) 24  Temp: 98.3 F (36.8 C)     TempSrc: Oral     SpO2: 98%  100% 100%  Weight:  110 kg    Height:  5\' 4"  (1.626 m)      Isolation Precautions No active isolations  Medications Medications  sodium chloride flush (NS) 0.9 % injection 10-40 mL (10 mLs Intracatheter Given 09/06/18 1359)  sodium chloride flush (NS) 0.9 % injection 10-40 mL (has no administration in time range)  acetaminophen (TYLENOL) CR tablet 650 mg (has no administration in time range)  colchicine tablet 0.6 mg (has no administration in time range)  atorvastatin (LIPITOR) tablet 80 mg (has no administration in time range)  hydrALAZINE (APRESOLINE) tablet 25 mg (has no administration in time range)  FLUoxetine (PROZAC) capsule 40 mg (has no administration in time range)  LORazepam (ATIVAN) injection 1 mg (has no administration  in time range)  linagliptin (TRADJENTA) tablet 5 mg (has no administration in time range)  docusate sodium (COLACE) capsule 100 mg (has no administration in time range)  meclizine (ANTIVERT) tablet 25 mg (has no administration in time range)  pantoprazole (PROTONIX) EC tablet 40 mg (has no administration in time range)  clopidogrel (PLAVIX) tablet 75 mg (has no administration in time range)  vitamin B-12 (CYANOCOBALAMIN) tablet 1,000 mcg (has no administration in time range)  gabapentin (NEURONTIN) capsule 300 mg (has no administration in time range)  levETIRAcetam (KEPPRA XR) 24 hr tablet 500 mg (has no administration in time range)  cholecalciferol (VITAMIN D) tablet 1,000 Units (has no administration in time range)  multivitamin with minerals tablet 1 tablet (has no administration in time range)  loratadine (CLARITIN) tablet 10 mg (has no administration in time range)  ipratropium (ATROVENT) 0.03 % nasal spray 2 spray (has no administration in time range)  ipratropium-albuterol (DUONEB) 0.5-2.5 (3) MG/3ML nebulizer solution 3 mL (has no administration in time range)  diclofenac sodium (VOLTAREN) 1 % transdermal gel 2 g (has no administration in time range)  mupirocin ointment (BACTROBAN) 2 % 1 application (has no administration in time range)  albuterol (VENTOLIN HFA) 108 (90 Base) MCG/ACT inhaler 2 puff (has no administration in time range)  piperacillin-tazobactam (ZOSYN) IVPB 3.375 g (0 g Intravenous Stopped 09/06/18 1440)    Mobility non-ambulatory Moderate fall risk   Focused Assessments UTI   R Recommendations: See Admitting Provider Note  Report given to:   Additional Notes: midline left arm

## 2018-09-06 NOTE — ED Provider Notes (Addendum)
[facility] Emergency Department Provider Note  ____________________________________________   I have reviewed the triage vital signs and the nursing notes. Where available I have reviewed prior notes and, if possible and indicated, outside hospital notes.   Patient seen and evaluated during the coronavirus epidemic during a time with low staffing  Patient seen for the symptoms described in the history of present illness. She was evaluated in the context of the global COVID-19 pandemic, which necessitated consideration that the patient might be at risk for infection with the SARS-CoV-2 virus that causes COVID-19. Institutional protocols and algorithms that pertain to the evaluation of patients at risk for COVID-19 are in a state of rapid change based on information released by regulatory bodies including the CDC and federal and state organizations. These policies and algorithms were followed during the patient's care in the ED.    HISTORY  Chief Complaint Altered Mental Status    HPI Katherine [name] is a 78 y.o. female  With a history of CHF CKD stage III COPD on 3 to 4 L home oxygen diabetes, elevated BMI, urinary tract infections resulting encephalopathy presents today with confusion of her baseline.  Patient did have a stroke in the past, but normally apparently can speak somewhat.  However, sometime in the last 24 hours her speech is declined.  Is not exactly clear when.  I did talk to her husband on the phone he states that he talked her last night and she seemed good.  This morning he was worried about her speech.  EMS report that the report from the staff was insufficient to give further information.   Vitals and sugar were normal on the way in however.  Patient's last visit she did have alteration in her speech patterns that were secondary to encephalopathy from urinary tract infection.  Patient herself cannot give me much of a history.  I have great difficulty  understanding her speech although she does follow commands to some extent    Past Medical History:  Diagnosis Date  . Angioedema   . Anxiety and depression   . CHF (congestive heart failure) ([place])   . Chronic constipation   . Chronic kidney disease    stage 3  . COPD (chronic obstructive pulmonary disease) ([place])   . Diabetes mellitus without complication ([place])   . Gallstones   . GERD (gastroesophageal reflux disease)   . Hypertension   . Left ventricular hypertrophy   . Osteoarthritis   . Prurigo nodularis   . Seizures ([place])   . Stroke ([place])   . Tobacco abuse   . Vitamin D deficiency disease     Patient Active Problem List   Diagnosis Date Noted  . UTI (urinary tract infection) 08/21/2018  . Chest pain 07/15/2018  . Palliative care encounter 05/10/2018  . Localized edema 05/10/2018  . Shortness of breath 05/10/2018  . Hematemesis 04/28/2018  . Influenza A 04/13/2018  . Acute on chronic congestive heart failure ([place])   . COPD with acute exacerbation ([place]) 02/24/2018  . Chronic diastolic heart failure ([place]) 12/31/2017  . Lymphedema 12/31/2017  . COPD exacerbation ([place]) 11/30/2017  . Urinary tract infection 11/22/2017  . 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 Diabetes mellitus type 2, uncomplicated ([place]) 33/29/5188  . Pressure injury of skin 10/29/2017  . Acute kidney injury superimposed on CKD ([place]) 10/24/2017  . Severe sepsis ([place]) 10/24/2017  . Possible Seizures ([place]) 10/08/2017  . Hyperlipemia 10/08/2017  . 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 Stroke-like episode ([place]) s/p IV tpa 10/04/2017  . Palliative care by specialist   . Elevated rheumatoid factor 09/05/2017  . Frequent hospital admissions 08/22/2017  . Aphasia 06/28/2017  . Hand pain 03/21/2017  . Dry skin 03/21/2017  . Pain in finger of left hand 09/12/2016  . Chronic fatigue 06/12/2016  . Left knee pain 06/12/2016  . Goals of care, counseling/discussion  03/13/2016  . Primary localized osteoarthritis of right knee 02/08/2016  . OSA (obstructive sleep apnea) 09/16/2015  . Rotator cuff syndrome 09/07/2015  . 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 Sleep disturbance 04/14/2015  . Coronary artery disease 03/14/2015  . 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 Acute respiratory failure with hypoxia ([place]) 09/16/2014  . 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 Anxiety   . Tobacco abuse   . Prurigo nodularis   . GERD (gastroesophageal reflux disease)   . COPD (chronic obstructive pulmonary disease) ([place])   . Osteoarthritis   . Vitamin D deficiency disease   . Chronic constipation   . CKD (chronic kidney disease), stage III ([place])   . Essential hypertension 05/20/2013  . Morbid obesity ([place]) 05/20/2013    Past Surgical History:  Procedure Laterality Date  . CHOLECYSTECTOMY    . 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   Procedure: TOTAL KNEE ARTHROPLASTY;  Surgeon: Hessie Knows, MD;  Location: [facility] ORS;  Service: Orthopedics;  Laterality: Right;    Prior to Admission medications   Medication Sig Start Date End Date Taking? Authorizing Provider  acetaminophen (TYLENOL) 650 MG CR tablet Take 650 mg by mouth 3 (three) times daily.     [provider]  albuterol (ACCUNEB) 0.63 MG/3ML nebulizer solution Take 3 mLs by nebulization every 8 (eight) hours as needed for wheezing.     [provider]  albuterol (PROVENTIL HFA;VENTOLIN HFA) 108 (90 Base) MCG/ACT inhaler Inhale 2 puffs into the lungs every 4 (four) hours as needed for wheezing or shortness of breath.    [provider]  atorvastatin (LIPITOR) 80 MG tablet Take 80 mg by mouth every evening.     [provider]  cetirizine (ZYRTEC) 10 MG tablet Take 5 mg by mouth at bedtime.    [provider]  cholecalciferol (VITAMIN D) 1000 units tablet Take 1,000 Units by mouth daily.    [provider]  clopidogrel (PLAVIX)  75 MG tablet Take 75 mg by mouth daily.    [provider]  colchicine 0.6 MG tablet Take 1 tablet (0.6 mg total) by mouth daily. 06/14/18   Loletha Grayer, MD  diclofenac sodium (VOLTAREN) 1 % GEL Apply 2 g topically every 12 (twelve) hours as needed (for left knee pain).     [provider]  docusate sodium (COLACE) 100 MG capsule Take 100 mg by mouth 2 (two) times daily.    [provider]  FLUoxetine (PROZAC) 40 MG capsule Take 40 mg by mouth daily.     [provider]  Fluticasone-Umeclidin-Vilant 100-62.5-25 MCG/INH AEPB Inhale 1 puff into the lungs daily.    [provider]  gabapentin (NEURONTIN) 300 MG capsule Take 300 mg by mouth at bedtime.    [provider]  hydrALAZINE (APRESOLINE) 25 MG tablet Take 25 mg by mouth 3 (three) times daily. 06/19/18   [provider]  ipratropium (ATROVENT) 0.03 % nasal spray Place 2 sprays into both nostrils 2 (two) times daily.     [provider]  ipratropium-albuterol (DUONEB) 0.5-2.5 (3) MG/3ML SOLN Take 3 mLs by nebulization 3 (three) times daily. 07/17/18   Gladstone Lighter, MD  levETIRAcetam (KEPPRA XR) 500 MG 24 hr tablet Take 1 tablet (500 mg total) by mouth daily. 11/19/17   Venancio Poisson, NP  linagliptin (TRADJENTA) 5 MG TABS tablet Take 5 mg by mouth daily.    [provider]  LORazepam (ATIVAN) 2 MG/ML injection Inject 0.5 mLs (1 mg total) into the muscle daily as needed for seizure. 06/13/18   Loletha Grayer, MD  meclizine (ANTIVERT) 25 MG tablet Take 25 mg by mouth every 12 (twelve) hours as needed for dizziness.     [provider]  Multiple Vitamin (MULTIVITAMIN WITH MINERALS) TABS tablet Take 1 tablet by mouth daily.    [provider]  mupirocin ointment (BACTROBAN) 2 % Place 1 application into the nose 2 (two) times daily. 06/13/18   Loletha Grayer, MD  nystatin (MYCOSTATIN) 100000 UNIT/ML suspension Take 5 mLs (500,000 Units total)  by mouth 4 (four) times daily. 06/13/18   Loletha Grayer, MD  pantoprazole (PROTONIX) 40 MG tablet Take 1 tablet (40 mg total) by mouth daily. 04/29/18   Bettey Costa, MD  vitamin B-12 1000 MCG tablet Take 1 tablet (1,000 mcg total) by mouth daily. 04/30/18   Bettey Costa, MD    Allergies Bee venom and Enalapril maleate  Family History  Problem Relation Age of Onset  . Alcohol abuse Mother   . Sickle cell anemia Daughter   . Hypertension Son   . Cancer Neg Hx   . COPD Neg Hx   . Diabetes Neg Hx   . Heart disease Neg Hx   . Stroke Neg Hx     Social History Social History   Tobacco Use  . Smoking status: Former Smoker    Packs/day: 0.50    Years: 60.00    Pack years: 30.00    Types: Cigarettes  . Smokeless tobacco: Never Used  . Tobacco comment: quite 31mo ago-03/26/18  Substance Use Topics  . Alcohol use: No    Alcohol/week: 0.0 standard drinks    Comment: rare  . Drug use: No    Review of Systems Level 5 chart caveat; no further history available due to patient status.   ____________________________________________   PHYSICAL EXAM:  VITAL SIGNS: ED Triage Vitals  Enc Vitals Group     BP 09/06/18 1204 (!) 110/92     Pulse Rate 09/06/18 1204 96     Resp 09/06/18 1204 20     Temp 09/06/18 1204 98.3 F (36.8 C)     Temp Source 09/06/18 1204 Oral     SpO2 09/06/18 1204 98 %     Weight 09/06/18 1205 242 lb 8.1 oz (110 kg)     Height 09/06/18 1205 5\' 4"  (1.626 m)     Head Circumference --      Peak Flow --      Pain Score --      Pain Loc --      Pain Edu? --      Excl. in [place]? --     Constitutional: Is alert in no acute distress I cannot determine orientation because I can understand her speech Eyes: Conjunctivae are normal Head: Atraumatic HEENT: No congestion/rhinnorhea. Mucous membranes are moist.  Oropharynx non-erythematous Neck:   Nontender with no meningismus, no masses, no stridor Cardiovascular: Normal rate, regular rhythm. Grossly normal heart  sounds.  Good peripheral circulation. Respiratory: Normal respiratory effort.  No retractions.  Some upper airway noise, no wheezes or rales appreciated.  No increased work of breathing Abdominal: Soft and nontender. No distention. No guarding no rebound Musculoskeletal: No lower extremity tenderness, no upper extremity tenderness. No joint effusions, no DVT signs strong distal pulses chronic appearing bilateral edema Neurologic: Difficult to determine neurologic exam due to compliance with exam, no obvious focal lesions, I can get her to wiggle her toes on both sides and she seems to move both arms equally to the extent that I can get her to participate in exam.  Patient has jumbled speech Skin:  Skin is warm, dry and intact. No rash noted.   ____________________________________________   LABS (all labs ordered are listed, but only abnormal results are displayed)  Labs Reviewed  SARS CORONAVIRUS 2 ([facility])  URINE CULTURE  CBC WITH DIFFERENTIAL/PLATELET  COMPREHENSIVE METABOLIC PANEL  AMMONIA  URINALYSIS, COMPLETE (UACMP) WITH MICROSCOPIC  PROTIME-INR  ETHANOL    Pertinent labs  results that were available during my care of the patient were reviewed by me and considered in my medical decision making (see chart for details). ____________________________________________  EKG  I personally interpreted any EKGs ordered by me or triage Sinus rate 94 no acute ST elevation depression, normal axis unremarkable EKG ____________________________________________  RADIOLOGY  Pertinent labs & imaging results that were available during my care of the patient were reviewed by me and considered in my medical decision making (see chart for details). If possible, patient and/or family made aware of any abnormal findings.  No results found. ____________________________________________    PROCEDURES  Procedure(s) performed:  None  Procedures  Critical Care performed: None  ____________________________________________   INITIAL IMPRESSION / ASSESSMENT AND PLAN / ED COURSE  Pertinent labs & imaging results that were available during my care of the patient were reviewed by me and considered in my medical decision making (see chart for details).   Patient here with a increase over her baseline speech problems, unclear the exact time of onset differential is broad, this is happened to her before with urinary tract infections, also has had prior CVAs.  We are attempting to contact the nursing home to gain more information from them about her symptoms.  Patient is in no acute medical distress at this time.  Obviously she has multiple comorbidities.  While we await further information, we will obtain CT scan of the head to evaluate for possible intercranial pathology, we will check blood work, keep her monitored, EMS also reported that her oxygen was completely kinked off when they arrived, and extremely is possible that this was contributing to her confusion  ----------------------------------------- 1:03 PM on 09/06/2018 -----------------------------------------  Patient remains nonfocal neurologic exam however she is still not talking to me.  Unclear etiology.  I did find out from the nursing home that her symptoms started between 8 and 9 this morning.  This is likely puts her outside the window for any intervention.  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  For all of these reasons she is not a candidate for TPA.  I discussed with Dr. Gale Journey of neurology and he agrees and will come evaluate the patient   ____________________________________________   FINAL CLINICAL IMPRESSION(S) / ED DIAGNOSES  Final diagnoses:  Altered mental status       This  chart was dictated using voice recognition software.  Despite best efforts to proofread,  errors can occur which can change meaning.      Schuyler Amor, MD 09/06/18 1223    Schuyler Amor, MD 09/06/18 [id number]

## 2018-09-06 NOTE — ED Notes (Signed)
Warm blanket given to pt

## 2018-09-06 NOTE — Consult Note (Signed)
Stroke Neurology Consult [place] at [facility]  Chief Complaint: AMS / aphasia  Referring Provider: Schuyler Amor, MD  HPI: [name] is a 78 y.o.  female with a history significant for CHF, CKD stage III, COPD, diabetes mellitus, GERD, hypertension, hyperlipidemia, suspected seizure, stroke, tobacco abuse who present from the nursing home with possible confusion / aphasia. History provided by review of EMR. Patient is non-verbal so cannot provide history. But able to follow commands and respond appropriately.   Patient reportedly was normal last night when her husband spoke with her on the phone. This morning, nursing staff at the facility said she was normal around [place] when they checked on her. However, at some point after that, she's been having confusion and difficulty with her speech. Nursing staff at the facility had reported to EMS that patient tends to be encephalopathic whenever she has a urinary tract infection. Patient was in fact recently admitted early July with a urinary tract infection and it was noted that she had alteration in her speech. An MRI brain w/o was not completed during this admission.   Per further chart review, patient was admitted back in August 2019 for respiratory complaints. "Unfortunately code stroke was called early morning secondary to aphasia, and seen by Teleneurology who felt that she could not exclude bilateral occipital or left MCA stroke patient with worsening aphasia. Subsequently receiving TPA, is now receiving a CT angiogram of the head." Patient was subsequently transferred to [facility]. Per their discharge summary "Speech difficulty episodes x2 -1 now and one in May. Both with negative MRIs...concerning for seizures." Patient was started on Keppra 1000 mg BID. Patient had subsequent admissions with similar symptoms and similar unremarkable work-up. Patient had a routine EEG on 10/2017 and it demonstrated no abnormalities. Then in  11/2017, she went to see [facility] - "This patient has not had additional episodes of recent EEG negative for seizure activity, will start to wean Keppra.  Recommend Keppra 500 mg in the evening and if no further seizure activity or episodes of aphasia, will repeat EEG and if remains stable, will decrease further at that time"  It appears that the first documented episode of aphasia was back 06/2017 and at that time, patient had a negative MRI brain as well. Unclear what her diagnosis was at that time. But since then she was started on Plavix 75 mg daily.    Past Medical History:  Diagnosis Date  . Angioedema   . Anxiety and depression   . CHF (congestive heart failure) ([place])   . Chronic constipation   . Chronic kidney disease    stage 3  . COPD (chronic obstructive pulmonary disease) ([place])   . Diabetes mellitus without complication ([place])   . Gallstones   . GERD (gastroesophageal reflux disease)   . Hypertension   . Left ventricular hypertrophy   . Osteoarthritis   . Prurigo nodularis   . Seizures ([place])   . Stroke ([place])   . Tobacco abuse   . Vitamin D deficiency disease     Past Surgical History:  Procedure Laterality Date  . CHOLECYSTECTOMY    . 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   Procedure: TOTAL KNEE ARTHROPLASTY;  Surgeon: Hessie Knows, MD;  Location: [facility] ORS;  Service: Orthopedics;  Laterality: Right;   Allergies  Allergen Reactions  . Bee Venom Swelling  . Enalapril Maleate Swelling   Prior to Admission medications  Medication Sig Start Date End Date Taking? Authorizing Provider  acetaminophen (TYLENOL) 650 MG CR tablet Take 650 mg by mouth 3 (three) times daily.     [provider]  albuterol (ACCUNEB) 0.63 MG/3ML nebulizer solution Take 3 mLs by nebulization every 8 (eight) hours as needed for wheezing.     [provider]  albuterol (PROVENTIL HFA;VENTOLIN HFA) 108 (90 Base)  MCG/ACT inhaler Inhale 2 puffs into the lungs every 4 (four) hours as needed for wheezing or shortness of breath.    [provider]  atorvastatin (LIPITOR) 80 MG tablet Take 80 mg by mouth every evening.     [provider]  cetirizine (ZYRTEC) 10 MG tablet Take 5 mg by mouth at bedtime.    [provider]  cholecalciferol (VITAMIN D) 1000 units tablet Take 1,000 Units by mouth daily.    [provider]  clopidogrel (PLAVIX) 75 MG tablet Take 75 mg by mouth daily.    [provider]  colchicine 0.6 MG tablet Take 1 tablet (0.6 mg total) by mouth daily. 06/14/18   Loletha Grayer, MD  diclofenac sodium (VOLTAREN) 1 % GEL Apply 2 g topically every 12 (twelve) hours as needed (for left knee pain).     [provider]  docusate sodium (COLACE) 100 MG capsule Take 100 mg by mouth 2 (two) times daily.    [provider]  FLUoxetine (PROZAC) 40 MG capsule Take 40 mg by mouth daily.     [provider]  Fluticasone-Umeclidin-Vilant 100-62.5-25 MCG/INH AEPB Inhale 1 puff into the lungs daily.    [provider]  gabapentin (NEURONTIN) 300 MG capsule Take 300 mg by mouth at bedtime.    [provider]  hydrALAZINE (APRESOLINE) 25 MG tablet Take 25 mg by mouth 3 (three) times daily. 06/19/18   [provider]  ipratropium (ATROVENT) 0.03 % nasal spray Place 2 sprays into both nostrils 2 (two) times daily.     [provider]  ipratropium-albuterol (DUONEB) 0.5-2.5 (3) MG/3ML SOLN Take 3 mLs by nebulization 3 (three) times daily. 07/17/18   Gladstone Lighter, MD  levETIRAcetam (KEPPRA XR) 500 MG 24 hr tablet Take 1 tablet (500 mg total) by mouth daily. 11/19/17   Venancio Poisson, NP  linagliptin (TRADJENTA) 5 MG TABS tablet Take 5 mg by mouth daily.    [provider]  LORazepam (ATIVAN) 2 MG/ML injection Inject 0.5 mLs (1 mg total) into the muscle daily as needed for seizure. 06/13/18   Loletha Grayer, MD  meclizine (ANTIVERT) 25 MG tablet Take 25 mg by mouth every 12 (twelve) hours as needed for dizziness.     [provider]  Multiple Vitamin (MULTIVITAMIN WITH MINERALS) TABS tablet Take 1 tablet by mouth daily.    [provider]  mupirocin ointment (BACTROBAN) 2 % Place 1 application into the nose 2 (two) times daily. 06/13/18   Loletha Grayer, MD  nystatin (MYCOSTATIN) 100000 UNIT/ML suspension Take 5 mLs (500,000 Units total) by mouth 4 (four) times daily. 06/13/18   Loletha Grayer, MD  pantoprazole (PROTONIX) 40 MG tablet Take 1 tablet (40 mg total) by mouth daily. 04/29/18   Bettey Costa, MD  vitamin B-12 1000 MCG tablet Take 1 tablet (1,000 mcg total) by mouth daily. 04/30/18   Bettey Costa, MD   Family History  Problem Relation Age of Onset  . Alcohol abuse Mother   . Sickle cell anemia Daughter   . Hypertension Son   . Cancer Neg Hx   .  COPD Neg Hx   . Diabetes Neg Hx   . Heart disease Neg Hx   . Stroke Neg Hx    Social History   Socioeconomic History  . Marital status: Married    Spouse name: [name]   . Number of children: 3  . Years of education: 70  . Highest education level: 11th grade  Occupational History  . Occupation: retired  Scientific laboratory technician  . Financial resource strain: Not hard at all  . Food insecurity    Worry: Never true    Inability: Never true  . Transportation needs    Medical: No    Non-medical: No  Tobacco Use  . Smoking status: Former Smoker    Packs/day: 0.50    Years: 60.00    Pack years: 30.00    Types: Cigarettes  . Smokeless tobacco: Never Used  . Tobacco comment: quite 39mo ago-03/26/18  Substance and Sexual Activity  . Alcohol use: No    Alcohol/week: 0.0 standard drinks    Comment: rare  . Drug use: No  . Sexual activity: Not Currently  Lifestyle  . Physical activity    Days per week: 0 days    Minutes per session: 0 min  . Stress: Not at all  Relationships  . Social connections    Talks on phone:  More than three times a week    Gets together: Once a week    Attends religious service: 1 to 4 times per year    Active member of club or organization: No    Attends meetings of clubs or organizations: Never    Relationship status: Married  . Intimate partner violence    Fear of current or ex partner: No    Emotionally abused: No    Physically abused: No    Forced sexual activity: No  Other Topics Concern  . Not on file  Social History Narrative  . Not on file     Review of Systems A comprehensive review of systems was negative.   Objective: Temp:  [98.3 F (36.8 C)] 98.3 F (36.8 C) (07/24 1204) Pulse Rate:  [96] 96 (07/24 1204) Resp:  [20] 20 (07/24 1204) BP: (110)/(92) 110/92 (07/24 1204) SpO2:  [98 %] 98 % (07/24 1204) Weight:  [110 kg] 110 kg (07/24 1205) No intake or output data in the 24 hours ending 09/06/18 1259  Wt Readings from Last 3 Encounters:  09/06/18 110 kg  08/25/18 108.6 kg  07/17/18 110.2 kg      Physical Exam:  GENERAL:  No acute distress.  EYES:  Pupils: pupils equally round, reactive to light.  ENT:   Throat: oropharynx clear.  CARDIOVASCULAR: tachycardic  RESPIRATORY: normal work of breathing GASTROINTESTINAL:  Soft, non-tender  MENTAL STATUS EXAM:    Orientation: Alert. Most likely oriented but is non-verbal at this time so not answering any orientation questions.  Memory: Follows commands well.   Language: Speech is non-verbal. Patient is grunting. But comprehension is intact. She is able to nod her head appropriately.   CRANIAL NERVES:    CN 2 (Optic): Visual fields intact to confrontation  CN 3,4,6 (EOM): Pupils equal and reactive to light. Full extraocular eye movement without nystagmus.  CN 5 (Trigeminal): Facial sensation is normal, no weakness of masticatory muscles.  CN 7 (Facial): No facial weakness or asymmetry.  CN 8 (Auditory): Auditory acuity grossly normal.  CN 9,10 (Glossophar): The uvula is midline, the palate elevates  symmetrically.  CN 11 (spinal access): Normal sternocleidomastoid  and trapezius strength.  CN 12 (Hypoglossal): The tongue is midline. No atrophy or fasciculations.[name]   MOTOR:  Muscle Strength: Strength - She is at least 5/5 in the bilateral upper extremities. In the bilateral lower extremities, she is at least anti-gravity - 4+/5 in the hip flexors bilaterally. No pronation or drift. Muscle Tone: Tone and muscle bulk are normal in the upper and lower extremities.   REFLEXES: DTRs - 1+ and symmetrical in all four extremities, plantar responses are flexor bilaterally.   COORDINATION: Intact finger-to-nose bilaterally (slowed but able to do it). Unable to do heel-to-shin. Intentional tremors noted bilaterally.   SENSATION:   Intact to light touch (nodded her head appropriately when asked if she could feel light touch)  GAIT: Deferred  Labs (I have reviewed the labs below) Recent Labs  Lab 09/06/18 1212  WBC 5.2  HGB 8.6*  HCT 27.0*  PLT 380  MCV 84.1   No results for input(s): NA, K, CL, CO2, BUN, CREATININE, GLU, CALCIUM, MG, PHOS in the last 168 hours.  UA: large leukoyctes with many bacteria. Negative nitrites    Diagnostic Studies: (I have independently reviewed the images and agree with the report)   Assessment: [name] is a 78 y.o.  female with the above history who presented with recurring episodes of inability to speak. In reviewing her medical chart, patient has had multiple events similar to this since 2015. She has had an extensive work-up with MRI brain w/o and EEG and all of which have been negative. Exam right now is consistent with inability to speak. Query expressive aphasia but this is unlikely related to a vascular etiology given multiple negative neurological work-up. Patient has no focal deficits at this time. It appears that these events are exacerbated by a UTI, which the patient currently has. This is more likely a metabolic/infectious encephalopathy.    Recommendations: - Can hold off on MRI brain w/o at this time unless patient does not improve with treatment of her UTI - Continue with home Plavix 75 mg daily and Keppra  - Please call neurology with further questions or concerns.   Laurene Footman, MD  Neurology

## 2018-09-06 NOTE — ED Notes (Signed)
Pt placed on bedpan her pt request

## 2018-09-06 NOTE — ED Notes (Signed)
Patient transported to CT 

## 2018-09-06 NOTE — ED Notes (Signed)
Pt had large bowel movement and soiled brief. Peri care performed and external female catheter placed

## 2018-09-06 NOTE — Progress Notes (Signed)
Pharmacy Antibiotic Note  Katherine [name] is a 78 y.o. female admitted on 09/06/2018 with UTI, prior history of ESBL E.coli that was sensitive to Zosyn.  Pharmacy has been consulted for Zosyn dosing.  Plan: Zosyn 3.375g IV q8h (4 hour infusion).  Height: 5\' 6"  (167.6 cm) Weight: 233 lb 0.4 oz (105.7 kg) IBW/kg (Calculated) : 59.3  Temp (24hrs), Avg:98.2 F (36.8 C), Min:98 F (36.7 C), Max:98.3 F (36.8 C)  Recent Labs  Lab 09/06/18 1212 09/06/18 1358  WBC 5.2  --   CREATININE  --  1.62*    Estimated Creatinine Clearance: 36.3 mL/min (A) (by C-G formula based on SCr of 1.62 mg/dL (H)).    Allergies  Allergen Reactions  . Bee Venom Swelling  . Enalapril Maleate Swelling    Antimicrobials this admission: Zosyn 7/24 >>   Microbiology results: 7/24 UCx: pending   Thank you for allowing pharmacy to be a part of this patient's care.  Paulina Fusi, PharmD, BCPS 09/06/2018 6:12 PM

## 2018-09-06 NOTE — ED Notes (Signed)
4 unsuccessful attempts at IV access. 2 by RN Maudie Mercury and 2 by this RN

## 2018-09-06 NOTE — ED Triage Notes (Addendum)
PT arrives via ems from [facility] for reported AMS. Facility unable to articulate changes from baseline. Pt nonverbal at baseline.

## 2018-09-06 NOTE — ED Notes (Signed)
Recollect sent to the lab

## 2018-09-07 ENCOUNTER — Inpatient Hospital Stay: Payer: Medicare HMO

## 2018-09-07 LAB — GLUCOSE, CAPILLARY: Glucose-Capillary: 90 mg/dL (ref 70–99)

## 2018-09-07 MED ORDER — ONDANSETRON HCL 4 MG/2ML IJ SOLN: 4.0000 mg | Freq: Four times a day (QID) | INTRAMUSCULAR | Status: DC | PRN

## 2018-09-07 MED ORDER — SODIUM CHLORIDE 0.9 % IV BOLUS
500.0000 mL | Freq: Once | INTRAVENOUS | Status: AC
  Administered 2018-09-07: 08:00:00 500 mL via INTRAVENOUS

## 2018-09-07 MED ORDER — TRAMADOL HCL 50 MG PO TABS
50.0000 mg | ORAL_TABLET | Freq: Four times a day (QID) | ORAL | Status: DC | PRN
  Administered 2018-09-07 – 2018-09-08 (×3): 50 mg via ORAL
  Filled 2018-09-07 (×3): qty 1

## 2018-09-07 MED ORDER — ENOXAPARIN SODIUM 40 MG/0.4ML ~~LOC~~ SOLN
40.0000 mg | SUBCUTANEOUS | Status: DC
  Administered 2018-09-07 – 2018-09-08 (×2): 40 mg via SUBCUTANEOUS
  Filled 2018-09-07 (×2): qty 0.4

## 2018-09-07 NOTE — Progress Notes (Signed)
Patient discharged to [facility]. Report clled to [place] at facility. EMS called for transport.

## 2018-09-07 NOTE — Progress Notes (Signed)
[place] [facility] [place] NAME: Katherine [name]    MR#:  [id number]  DATE OF BIRTH:  15-Nov-1940  SUBJECTIVE:   Patient presented to the hospital due to altered mental status and suspected to have a UTI.  Patient has a previous history of ESBL UTI.  Patient also had some aphasia yesterday which has now resolved.  Patient presently complaining of left knee pain.  REVIEW OF SYSTEMS:    Review of Systems  Constitutional: Negative for chills and fever.  HENT: Negative for congestion and tinnitus.   Eyes: Negative for blurred vision and double vision.  Respiratory: Negative for cough, shortness of breath and wheezing.   Cardiovascular: Negative for chest pain, orthopnea and PND.  Gastrointestinal: Negative for abdominal pain, diarrhea, nausea and vomiting.  Genitourinary: Negative for dysuria and hematuria.  Musculoskeletal: Positive for joint pain (left knee pain).  Neurological: Negative for dizziness, sensory change and focal weakness.  All other systems reviewed and are negative.   Nutrition: Heart Healthy/Carb control Tolerating Diet: Yes Tolerating PT: pt. bedbound at baseline.   DRUG ALLERGIES:   Allergies  Allergen Reactions  . Bee Venom Swelling  . Enalapril Maleate Swelling    VITALS:  Blood pressure 103/68, pulse 100, temperature 98.2 F (36.8 C), temperature source Oral, resp. rate 20, height 5\' 6"  (1.676 m), weight 105.7 kg, SpO2 100 %.  PHYSICAL EXAMINATION:   Physical Exam  GENERAL:  78 y.o.-year-old patient lying in bed in no acute distress.  EYES: Pupils equal, round, reactive to light and accommodation. No scleral icterus. Extraocular muscles intact.  HEENT: Head atraumatic, normocephalic. Oropharynx and nasopharynx clear.  NECK:  Supple, no jugular venous distention. No thyroid enlargement, no tenderness.  LUNGS: Normal breath sounds bilaterally, no wheezing, rales, rhonchi. No use of accessory muscles of  respiration.  CARDIOVASCULAR: S1, S2 normal. No murmurs, rubs, or gallops.  ABDOMEN: Soft, nontender, nondistended. Bowel sounds present. No organomegaly or mass.  EXTREMITIES: No cyanosis, clubbing. + 1-2 edema b/l NEUROLOGIC: Cranial nerves II through XII are intact. No focal Motor or sensory deficits b/l. Globally weak.  PSYCHIATRIC: The patient is alert and oriented x 3.  SKIN: No obvious rash, lesion, or ulcer.    LABORATORY PANEL:   CBC Recent Labs  Lab 09/06/18 1212  WBC 5.2  HGB 8.6*  HCT 27.0*  PLT 380   ------------------------------------------------------------------------------------------------------------------  Chemistries  Recent Labs  Lab 09/06/18 1358  NA 139  K 4.5  CL 105  CO2 27  GLUCOSE 92  BUN 21  CREATININE 1.62*  CALCIUM 9.4  AST 36  ALT 22  ALKPHOS 154*  BILITOT 0.5   ------------------------------------------------------------------------------------------------------------------  Cardiac Enzymes No results for input(s): TROPONINI in the last 168 hours. ------------------------------------------------------------------------------------------------------------------  RADIOLOGY:  Ct Head Wo Contrast  Result Date: 09/06/2018 CLINICAL DATA:  Encephalopathy due to urinary tract infection with confusion. EXAM: CT HEAD WITHOUT CONTRAST TECHNIQUE: Contiguous axial images were obtained from the base of the skull through the vertex without intravenous contrast. COMPARISON:  August 21, 2018 FINDINGS: Brain: No evidence of acute infarction, hemorrhage, hydrocephalus, extra-axial collection or mass lesion/mass effect. Moderate brain parenchymal volume loss and deep white matter microangiopathy. Vascular: Calcific atherosclerotic disease of the intracranial arteries. Skull: Partial opacification of the right mastoid air cells. Mild mucosal thickening of the ethmoid sinuses. Sinuses/Orbits: No acute finding. Other: None. IMPRESSION: 1. No acute intracranial  abnormality. 2. Moderate brain parenchymal atrophy and chronic microvascular disease. Electronically Signed   By:  Fidela Salisbury M.D.   On: 09/06/2018 12:59   Dg Chest Port 1 View  Result Date: 09/06/2018 CLINICAL DATA:  History of stroke. EXAM: PORTABLE CHEST 1 VIEW COMPARISON:  August 21, 2018 FINDINGS: Cardiomediastinal silhouette is normal. Mediastinal contours appear intact. Calcific atherosclerotic disease of the aorta. There is no evidence of focal airspace consolidation, pleural effusion or pneumothorax. Bilateral pleural/subpleural thickening. Osseous structures are without acute abnormality. Soft tissues are grossly normal. IMPRESSION: 1. No active disease. 2. Bilateral pleural/subpleural thickening. Electronically Signed   By: Fidela Salisbury M.D.   On: 09/06/2018 12:51     ASSESSMENT AND PLAN:   78 year old female with past medical history of previous CVA, seizures, hypertension, GERD, diabetes, COPD, anxiety/depression, CHF, previous history of ESBL UTI who presented to the hospital due to altered mental status.  1.  Altered mental status-etiology unclear and thought to be metabolic on admission as patient's urinalysis was positive for UTI. - CT head was negative for acute pathology, mental status is improved is back to baseline.  Continue IV Zosyn for the UTI, urine cultures pending.  2.  Urinary tract infection-patient has a previous history of ESBL UTI.  Her urinalysis here is mildly positive.  Clinically she is not symptomatic with fever, leukocytosis. - Continue empiric Zosyn and can likely discontinue next 24 to 48 hours.  3.  Left knee pain-patient complaining of some left knee pain, no obvious abnormality noted on exam. -We will get x-ray of the left knee.  Patient has history of gout but does not seem to have an acute attack.  4.  History of seizures- no acute activity. -Continue Keppra.  5.  Anxiety/depression-continue Prozac.  6.  Essential hypertension-  continue hydralazine.  7.  History of previous CVA-continue Plavix, atorvastatin.  8. Neuropathy - cont. Gabapentin.    All the records are reviewed and case discussed with Care Management/Social Worker. Management plans discussed with the patient, family and they are in agreement.  CODE STATUS: Full code  DVT Prophylaxis: Lovenox  TOTAL TIME TAKING CARE OF THIS PATIENT: 30 minutes.   POSSIBLE D/C IN 1-2 DAYS, DEPENDING ON CLINICAL CONDITION.   Henreitta Leber M.D on 09/07/2018 at 1:10 PM  Between 7am to 6pm - Pager - [phone]  After 6pm go to [email or web address] - Technical brewer [place] [facility]  Office  [phone]  CC: Primary care physician; Valerie Roys, DO

## 2018-09-07 NOTE — Plan of Care (Signed)

## 2018-09-08 LAB — CBC
HCT: 25.9 % — ABNORMAL LOW (ref 36.0–46.0)
Hemoglobin: 8.1 g/dL — ABNORMAL LOW (ref 12.0–15.0)
MCH: 26.7 pg (ref 26.0–34.0)
MCHC: 31.3 g/dL (ref 30.0–36.0)
MCV: 85.5 fL (ref 80.0–100.0)
Platelets: 356 10*3/uL (ref 150–400)
RBC: 3.03 MIL/uL — ABNORMAL LOW (ref 3.87–5.11)
RDW: 20.2 % — ABNORMAL HIGH (ref 11.5–15.5)
WBC: 5.6 10*3/uL (ref 4.0–10.5)
nRBC: 0 % (ref 0.0–0.2)

## 2018-09-08 LAB — URINE CULTURE: Culture: >=100000 — AB

## 2018-09-08 MED ORDER — PREDNISONE 50 MG PO TABS
50.0000 mg | ORAL_TABLET | Freq: Every day | ORAL | Status: DC
  Administered 2018-09-08 – 2018-09-09 (×2): 50 mg via ORAL
  Filled 2018-09-08 (×2): qty 1

## 2018-09-08 MED ORDER — PREDNISONE 10 MG PO TABS: 10.0000 mg | ORAL_TABLET | Freq: Every day | ORAL | Status: DC

## 2018-09-08 MED ORDER — PREDNISONE 20 MG PO TABS: 30.0000 mg | ORAL_TABLET | Freq: Every day | ORAL | Status: DC

## 2018-09-08 MED ORDER — ALTEPLASE 2 MG IJ SOLR
2.0000 mg | Freq: Once | INTRAMUSCULAR | Status: AC
  Administered 2018-09-09: 2 mg
  Filled 2018-09-08: qty 2

## 2018-09-08 MED ORDER — PREDNISONE 20 MG PO TABS
40.0000 mg | ORAL_TABLET | Freq: Every day | ORAL | Status: DC
  Administered 2018-09-09: 40 mg via ORAL
  Filled 2018-09-08: qty 2

## 2018-09-08 MED ORDER — PREDNISONE 20 MG PO TABS: 20.0000 mg | ORAL_TABLET | Freq: Every day | ORAL | Status: DC

## 2018-09-08 MED ORDER — GUAIFENESIN-DM 100-10 MG/5ML PO SYRP: 15.0000 mL | ORAL_SOLUTION | ORAL | Status: DC | PRN

## 2018-09-08 NOTE — Plan of Care (Signed)
  Problem: Education: Goal: Knowledge of General Education information will improve Description: Including pain rating scale, medication(s)/side effects and non-pharmacologic comfort measures Outcome: Progressing   Problem: Health Behavior/Discharge Planning: Goal: Ability to manage health-related needs will improve Outcome: Progressing   Problem: Clinical Measurements: Goal: Ability to maintain clinical measurements within normal limits will improve Outcome: Progressing Goal: Will remain free from infection Outcome: Progressing Goal: Diagnostic test results will improve Outcome: Progressing Goal: Cardiovascular complication will be avoided Outcome: Progressing   Problem: Nutrition: Goal: Adequate nutrition will be maintained Outcome: Progressing   Problem: Elimination: Goal: Will not experience complications related to bowel motility Outcome: Progressing Goal: Will not experience complications related to urinary retention Outcome: Progressing   Problem: Pain Managment: Goal: General experience of comfort will improve Outcome: Progressing   Problem: Safety: Goal: Ability to remain free from injury will improve Outcome: Progressing   Problem: Skin Integrity: Goal: Risk for impaired skin integrity will decrease Outcome: Progressing

## 2018-09-08 NOTE — Progress Notes (Signed)
[facility] [place] NAME: Katherine [name]    MR#:  [id number]  DATE OF BIRTH:  [birth year]  SUBJECTIVE:   Patient presented to the hospital due to altered mental status and suspected to have a UTI.  Patient has a previous history of ESBL UTI.  Patient still complaining of left knee pain, x-ray yesterday showed some degenerative arthritis but no acute fracture.  Urine cultures still positive for ESBL.  REVIEW OF SYSTEMS:    Review of Systems  Constitutional: Negative for chills and fever.  HENT: Negative for congestion and tinnitus.   Eyes: Negative for blurred vision and double vision.  Respiratory: Negative for cough, shortness of breath and wheezing.   Cardiovascular: Negative for chest pain, orthopnea and PND.  Gastrointestinal: Negative for abdominal pain, diarrhea, nausea and vomiting.  Genitourinary: Negative for dysuria and hematuria.  Musculoskeletal: Positive for joint pain (left knee pain).  Neurological: Negative for dizziness, sensory change and focal weakness.  All other systems reviewed and are negative.   Nutrition: Heart Healthy/Carb control Tolerating Diet: Yes Tolerating PT: pt. bedbound at baseline.   DRUG ALLERGIES:   Allergies  Allergen Reactions  . Bee Venom Swelling  . Enalapril Maleate Swelling    VITALS:  Blood pressure 102/67, pulse 88, temperature 98.3 F (36.8 C), temperature source Oral, resp. rate 19, height 5\' 6"  (1.676 m), weight 105.7 kg, SpO2 100 %.  PHYSICAL EXAMINATION:   Physical Exam  GENERAL:  78 y.o.-year-old patient lying in bed in no acute distress.  EYES: Pupils equal, round, reactive to light and accommodation. No scleral icterus. Extraocular muscles intact.  HEENT: Head atraumatic, normocephalic. Oropharynx and nasopharynx clear.  NECK:  Supple, no jugular venous distention. No thyroid enlargement, no tenderness.  LUNGS: Normal breath sounds bilaterally, no wheezing, rales,  rhonchi. No use of accessory muscles of respiration.  CARDIOVASCULAR: S1, S2 normal. No murmurs, rubs, or gallops.  ABDOMEN: Soft, nontender, nondistended. Bowel sounds present. No organomegaly or mass.  EXTREMITIES: No cyanosis, clubbing. + 1-2 edema b/l.  Left knee swelling but no warmth or redness. NEUROLOGIC: Cranial nerves II through XII are intact. No focal Motor or sensory deficits b/l. Globally weak.  PSYCHIATRIC: The patient is alert and oriented x 3.  SKIN: No obvious rash, lesion, or ulcer.    LABORATORY PANEL:   CBC Recent Labs  Lab 09/08/18 0626  WBC 5.6  HGB 8.1*  HCT 25.9*  PLT 356   ------------------------------------------------------------------------------------------------------------------  Chemistries  Recent Labs  Lab 09/06/18 1358  NA 139  K 4.5  CL 105  CO2 27  GLUCOSE 92  BUN 21  CREATININE 1.62*  CALCIUM 9.4  AST 36  ALT 22  ALKPHOS 154*  BILITOT 0.5   ------------------------------------------------------------------------------------------------------------------  Cardiac Enzymes No results for input(s): TROPONINI in the last 168 hours. ------------------------------------------------------------------------------------------------------------------  RADIOLOGY:  Dg Knee Left Port  Result Date: 09/07/2018 CLINICAL DATA:  Chronic knee pain, swelling EXAM: PORTABLE LEFT KNEE - 1-2 VIEW COMPARISON:  None. FINDINGS: No fracture or dislocation of the left knee. Moderate tricompartmental joint space narrowing and osteophytosis. Small nonspecific knee joint effusion. Soft tissue edema over the anterior extensor mechanism. IMPRESSION: No fracture or dislocation of the left knee. Moderate tricompartmental joint space narrowing and osteophytosis. Small nonspecific knee joint effusion. Soft tissue edema over the anterior extensor mechanism. Electronically Signed   By: Eddie Candle M.D.   On: 09/07/2018 13:52     ASSESSMENT AND PLAN:    78 year old  female with past medical history of previous CVA, seizures, hypertension, GERD, diabetes, COPD, anxiety/depression, CHF, previous history of ESBL UTI who presented to the hospital due to altered mental status.  1.  Altered mental status-etiology unclear and thought to be metabolic on admission as patient's urinalysis was positive for UTI. -Continue IV Zosyn for the UTI, mental status back to baseline. - CT head was negative for acute pathology  2.  Urinary tract infection-patient has a previous history of ESBL UTI.  Her urinalysis here is mildly positive.  Clinically she is not symptomatic with fever, leukocytosis. -Continue empiric Zosyn, urine cultures are still positive for ESBL.  Will discuss with infectious disease if this is a true infection versus colonization.  3.  Left knee pain- continues to complain of left knee pain.  X-rays yesterday showed significant degenerative arthritis but no acute fracture.  Obtain orthopedic consult today and they do not recommend any arthrocentesis. - We will empirically start the patient on a prednisone taper, continue colchicine as needed.  4.  History of seizures- no acute activity. -Continue Keppra.  5.  Anxiety/depression-continue Prozac.  6.  Essential hypertension- continue hydralazine.  7.  History of previous CVA-continue Plavix, atorvastatin.  8. Neuropathy - cont. Gabapentin.   Possible d/c back to sNf tomorrow.   All the records are reviewed and case discussed with Care Management/Social Worker. Management plans discussed with the patient, family and they are in agreement.  CODE STATUS: Full code  DVT Prophylaxis: Lovenox  TOTAL TIME TAKING CARE OF THIS PATIENT: 30 minutes.   POSSIBLE D/C IN 1-2 DAYS, DEPENDING ON CLINICAL CONDITION.   Henreitta Leber M.D on 09/08/2018 at 1:05 PM  Between 7am to 6pm - Pager - [phone]  After 6pm go to [email or web address] - Technical brewer [facility]   Office  [phone]  CC: Primary care physician; Valerie Roys, DO

## 2018-09-08 NOTE — Consult Note (Signed)
ORTHOPAEDIC CONSULTATION  REQUESTING PHYSICIAN: Henreitta Leber, MD  Chief Complaint: left knee pain  HPI: Katherine [name] is a 78 y.o. female who complains of left knee pain. She has a history of gout and severe left knee arthritis. Please see H&P and ED notes for details. Denies any numbness, tingling or constitutional symptoms.  Past Medical History:  Diagnosis Date  . Angioedema   . Anxiety and depression   . CHF (congestive heart failure) ([place])   . Chronic constipation   . Chronic kidney disease    stage 3  . COPD (chronic obstructive pulmonary disease) ([place])   . Diabetes mellitus without complication ([place])   . Gallstones   . GERD (gastroesophageal reflux disease)   . Hypertension   . Left ventricular hypertrophy   . Osteoarthritis   . Prurigo nodularis   . Seizures ([place])   . Stroke ([place])   . Tobacco abuse   . Vitamin D deficiency disease    Past Surgical History:  Procedure Laterality Date  . CHOLECYSTECTOMY    . 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   Procedure: TOTAL KNEE ARTHROPLASTY;  Surgeon: Hessie Knows, MD;  Location: [facility] ORS;  Service: Orthopedics;  Laterality: Right;   Social History   Socioeconomic History  . Marital status: Married    Spouse name: [name]   . Number of children: 3  . Years of education: 5  . Highest education level: 11th grade  Occupational History  . Occupation: retired  Scientific laboratory technician  . Financial resource strain: Not hard at all  . Food insecurity    Worry: Never true    Inability: Never true  . Transportation needs    Medical: No    Non-medical: No  Tobacco Use  . Smoking status: Former Smoker    Packs/day: 0.50    Years: 60.00    Pack years: 30.00    Types: Cigarettes  . Smokeless tobacco: Never Used  . Tobacco comment: quite 22moago-03/26/18  Substance and Sexual Activity  . Alcohol use: No    Alcohol/week: 0.0 standard drinks    Comment: rare  . Drug use: No  . Sexual  activity: Not Currently  Lifestyle  . Physical activity    Days per week: 0 days    Minutes per session: 0 min  . Stress: Not at all  Relationships  . Social connections    Talks on phone: More than three times a week    Gets together: Once a week    Attends religious service: 1 to 4 times per year    Active member of club or organization: No    Attends meetings of clubs or organizations: Never    Relationship status: Married  Other Topics Concern  . Not on file  Social History Narrative  . Not on file   Family History  Problem Relation Age of Onset  . Alcohol abuse Mother   . Sickle cell anemia Daughter   . Hypertension Son   . Cancer Neg Hx   . COPD Neg Hx   . Diabetes Neg Hx   . Heart disease Neg Hx   . Stroke Neg Hx    Allergies  Allergen Reactions  . Bee Venom Swelling  . Enalapril Maleate Swelling   Prior to Admission medications   Medication Sig Start Date End Date Taking? Authorizing Provider  atorvastatin (LIPITOR) 80 MG tablet Take 80 mg by mouth every evening.    Yes  [provider]  cetirizine (ZYRTEC) 10 MG tablet Take 5 mg by mouth at bedtime.   Yes [provider]  cholecalciferol (VITAMIN D) 1000 units tablet Take 1,000 Units by mouth daily.   Yes [provider]  clopidogrel (PLAVIX) 75 MG tablet Take 75 mg by mouth daily.   Yes [provider]  diclofenac sodium (VOLTAREN) 1 % GEL Apply 2 g topically every 12 (twelve) hours as needed (for left knee pain).    Yes [provider]  docusate sodium (COLACE) 100 MG capsule Take 100 mg by mouth 2 (two) times daily.   Yes [provider]  FLUoxetine (PROZAC) 40 MG capsule Take 40 mg by mouth daily.    Yes [provider]  Fluticasone-Umeclidin-Vilant 100-62.5-25 MCG/INH AEPB Inhale 1 puff into the lungs daily.   Yes [provider]  gabapentin (NEURONTIN) 300 MG capsule Take 300 mg by mouth at bedtime.   Yes [provider]   hydrALAZINE (APRESOLINE) 25 MG tablet Take 25 mg by mouth 3 (three) times daily. 06/19/18  Yes [provider]  ipratropium (ATROVENT) 0.03 % nasal spray Place 2 sprays into both nostrils 2 (two) times daily.    Yes [provider]  levETIRAcetam (KEPPRA XR) 500 MG 24 hr tablet Take 1 tablet (500 mg total) by mouth daily. 11/19/17  Yes Venancio Poisson, NP  linagliptin (TRADJENTA) 5 MG TABS tablet Take 5 mg by mouth daily.   Yes [provider]  meclizine (ANTIVERT) 25 MG tablet Take 25 mg by mouth every 12 (twelve) hours as needed for dizziness.    Yes [provider]  Multiple Vitamin (MULTIVITAMIN WITH MINERALS) TABS tablet Take 1 tablet by mouth daily.   Yes [provider]  mupirocin ointment (BACTROBAN) 2 % Place 1 application into the nose 2 (two) times daily. 06/13/18  Yes Wieting, Richard, MD  pantoprazole (PROTONIX) 40 MG tablet Take 1 tablet (40 mg total) by mouth daily. 04/29/18  Yes Bettey Costa, MD  vitamin B-12 1000 MCG tablet Take 1 tablet (1,000 mcg total) by mouth daily. 04/30/18  Yes Bettey Costa, MD  acetaminophen (TYLENOL) 650 MG CR tablet Take 650 mg by mouth 3 (three) times daily.     [provider]  albuterol (ACCUNEB) 0.63 MG/3ML nebulizer solution Take 3 mLs by nebulization every 8 (eight) hours as needed for wheezing.     [provider]  albuterol (PROVENTIL HFA;VENTOLIN HFA) 108 (90 Base) MCG/ACT inhaler Inhale 2 puffs into the lungs every 4 (four) hours as needed for wheezing or shortness of breath.    [provider]  colchicine 0.6 MG tablet Take 1 tablet (0.6 mg total) by mouth daily. 06/14/18   Wieting, Richard, MD  ipratropium-albuterol (DUONEB) 0.5-2.5 (3) MG/3ML SOLN Take 3 mLs by nebulization 3 (three) times daily. 07/17/18   Gladstone Lighter, MD  LORazepam (ATIVAN) 2 MG/ML injection Inject 0.5 mLs (1 mg total) into the muscle daily as needed for seizure. 06/13/18   Loletha Grayer, MD   nitroGLYCERIN (NITROSTAT) 0.4 MG SL tablet Place 0.4 mg under the tongue as needed. 08/07/18   [provider]  nystatin (MYCOSTATIN) 100000 UNIT/ML suspension Take 5 mLs (500,000 Units total) by mouth 4 (four) times daily. Patient not taking: Reported on 09/06/2018 06/13/18   Loletha Grayer, MD   Ct Head Wo Contrast  Result Date: 09/06/2018 CLINICAL DATA:  Encephalopathy due to urinary tract infection with confusion. EXAM: CT HEAD WITHOUT CONTRAST TECHNIQUE: Contiguous axial images were obtained  from the base of the skull through the vertex without intravenous contrast. COMPARISON:  August 21, 2018 FINDINGS: Brain: No evidence of acute infarction, hemorrhage, hydrocephalus, extra-axial collection or mass lesion/mass effect. Moderate brain parenchymal volume loss and deep white matter microangiopathy. Vascular: Calcific atherosclerotic disease of the intracranial arteries. Skull: Partial opacification of the right mastoid air cells. Mild mucosal thickening of the ethmoid sinuses. Sinuses/Orbits: No acute finding. Other: None. IMPRESSION: 1. No acute intracranial abnormality. 2. Moderate brain parenchymal atrophy and chronic microvascular disease. Electronically Signed   By: Fidela Salisbury M.D.   On: 09/06/2018 12:59   Dg Chest Port 1 View  Result Date: 09/06/2018 CLINICAL DATA:  History of stroke. EXAM: PORTABLE CHEST 1 VIEW COMPARISON:  August 21, 2018 FINDINGS: Cardiomediastinal silhouette is normal. Mediastinal contours appear intact. Calcific atherosclerotic disease of the aorta. There is no evidence of focal airspace consolidation, pleural effusion or pneumothorax. Bilateral pleural/subpleural thickening. Osseous structures are without acute abnormality. Soft tissues are grossly normal. IMPRESSION: 1. No active disease. 2. Bilateral pleural/subpleural thickening. Electronically Signed   By: Fidela Salisbury M.D.   On: 09/06/2018 12:51   Dg Knee Left Port  Result Date:  09/07/2018 CLINICAL DATA:  Chronic knee pain, swelling EXAM: PORTABLE LEFT KNEE - 1-2 VIEW COMPARISON:  None. FINDINGS: No fracture or dislocation of the left knee. Moderate tricompartmental joint space narrowing and osteophytosis. Small nonspecific knee joint effusion. Soft tissue edema over the anterior extensor mechanism. IMPRESSION: No fracture or dislocation of the left knee. Moderate tricompartmental joint space narrowing and osteophytosis. Small nonspecific knee joint effusion. Soft tissue edema over the anterior extensor mechanism. Electronically Signed   By: Eddie Candle M.D.   On: 09/07/2018 13:52    Positive ROS: All other systems have been reviewed and were otherwise negative with the exception of those mentioned in the HPI and as above.  Physical Exam: General: Alert, no acute distress Cardiovascular: No pedal edema Respiratory: No cyanosis, no use of accessory musculature GI: No organomegaly, abdomen is soft and non-tender Skin: No lesions in the area of chief complaint Neurologic: Sensation intact distally Psychiatric: Patient is competent for consent with normal mood and affect Lymphatic: No axillary or cervical lymphadenopathy  MUSCULOSKELETAL: left knee with moderate swelling. Extensive scar over the anterior knee from prior injury. Compartments soft. Pitting edema and venous stasis changes throughout the lower extremity.   Assessment: Left knee pain  Plan: There are no signs or symptoms of infection at this time. I would not recommend any aspiration or injection due to the risk of introducing infection. She would benefit from a steroid taper, topical antiinflammatory and pain medication. Also recommend physical therapy. If erythema or warmth develop, recommend ESR, CRP for infection and new WBC with man diff. Will follow. Please call with questions.    Lovell Sheehan, MD    09/08/2018 10:34 AM

## 2018-09-08 NOTE — Progress Notes (Addendum)
Shift summary:  - Patient AA+Ox4. Reports weakness. - Ortho consult today for left knee pain.

## 2018-09-08 NOTE — Progress Notes (Addendum)
Patient having extensive swelling (3+) to Left upper extremity where midline is placed. She was having complaints of numbness and tingling. Dr. Marcille Blanco notified, and were are attempting Cath flow. Will administer and continue to monitor.  0140  Went in to assess if able to get blood return, still unable to do so.

## 2018-09-09 ENCOUNTER — Inpatient Hospital Stay: Payer: Medicare HMO

## 2018-09-09 MED ORDER — FOSFOMYCIN TROMETHAMINE 3 G PO PACK
3.0000 g | PACK | ORAL | Status: DC
  Administered 2018-09-09: 3 g via ORAL
  Filled 2018-09-09: qty 3

## 2018-09-09 MED ORDER — FOSFOMYCIN TROMETHAMINE 3 G PO PACK: 3.0000 g | PACK | ORAL | Status: AC

## 2018-09-09 MED ORDER — PREDNISONE 10 MG PO TABS: ORAL_TABLET | ORAL | Status: DC

## 2018-09-09 NOTE — Progress Notes (Addendum)
Consulted IV team to come and look at midline. Midline was taken out of patient. Talked with Doctor Jannifer Franklin about concerns with the left upper extremity swelling and he ordered an [place] of that arm.

## 2018-09-09 NOTE — NC FL2 (Signed)
[place] LEVEL OF CARE SCREENING TOOL     IDENTIFICATION  Patient Name: Katherine [name] Birthdate: [birth year] Sex: female Admission Date (Current Location): 09/06/2018  [place] and [place] Number:  Engineering geologist and Address:  [facility], [address], [place], [place] [address]      Provider Number: [id number]  Attending Physician Name and Address:  Henreitta Leber, MD  Relative Name and Phone Number:  Bearl Mulberry  [phone]    Current Level of Care: Hospital Recommended Level of Care: [place] Prior Approval Number:    Date Approved/Denied:   PASRR Number: [id number]  Discharge Plan: SNF    Current Diagnoses: Patient Active Problem List   Diagnosis Date Noted  . Delirium   . UTI (urinary tract infection) 08/21/2018  . Chest pain 07/15/2018  . Palliative care encounter 05/10/2018  . Localized edema 05/10/2018  . Shortness of breath 05/10/2018  . Hematemesis 04/28/2018  . Influenza A 04/13/2018  . Acute on chronic congestive heart failure ([place])   . COPD with acute exacerbation ([place]) 02/24/2018  . Chronic diastolic heart failure ([place]) 12/31/2017  . Lymphedema 12/31/2017  . COPD exacerbation ([place]) 11/30/2017  . Urinary tract infection 11/22/2017  . 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[place]) 11/16/2017  . Diabetes mellitus type 2, uncomplicated ([place]) 41/32/4401  . Pressure injury of skin 10/29/2017  . Acute kidney injury superimposed on CKD ([place]) 10/24/2017  . Severe sepsis ([place]) 10/24/2017  . Possible Seizures ([place]) 10/08/2017  . Hyperlipemia 10/08/2017  . 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 Stroke-like episode ([place]) s/p IV tpa 10/04/2017  . Palliative care by specialist   . Elevated rheumatoid factor 09/05/2017  . Frequent hospital admissions 08/22/2017  . Aphasia 06/28/2017  . Hand pain 03/21/2017  . Dry skin 03/21/2017  . Pain in finger of left hand 09/12/2016  .  Chronic fatigue 06/12/2016  . Left knee pain 06/12/2016  . Goals of care, counseling/discussion 03/13/2016  . Primary localized osteoarthritis of right knee 02/08/2016  . OSA (obstructive sleep apnea) 09/16/2015  . Rotator cuff syndrome 09/07/2015  . 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 Sleep disturbance 04/14/2015  . Coronary artery disease 03/14/2015  . 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 Acute respiratory failure with hypoxia ([place]) 09/16/2014  . 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 Anxiety   . Tobacco abuse   . Prurigo nodularis   . GERD (gastroesophageal reflux disease)   . COPD (chronic obstructive pulmonary disease) ([place])   . Osteoarthritis   . Vitamin D deficiency disease   . Chronic constipation   . CKD (chronic kidney disease), stage III ([place])   . Essential hypertension 05/20/2013  . Morbid obesity ([place]) 05/20/2013    Orientation RESPIRATION BLADDER Height & Weight     Self, Time, Situation, Place  Normal Incontinent Weight: 105.7 kg Height:  5\' 6"  (167.6 cm)  BEHAVIORAL SYMPTOMS/MOOD NEUROLOGICAL BOWEL NUTRITION STATUS    Convulsions/Seizures(History of seizures- no current activity- on Keppra) Continent Diet  AMBULATORY STATUS COMMUNICATION OF NEEDS Skin   Extensive Assist Verbally Normal                       Personal Care Assistance Level of Assistance    Bathing Assistance: Limited assistance Feeding assistance: Limited assistance Dressing Assistance: Limited assistance     Functional Limitations Info    Sight Info: Adequate Hearing Info: Adequate Speech Info: Adequate    SPECIAL CARE FACTORS FREQUENCY  Contractures Contractures Info: Not present    Additional Factors Info  Code Status Code Status Info: Full Allergies Info: Bee Venom, Enalapril           Current Medications (09/09/2018):  This is the current hospital active medication list Current Facility-Administered Medications  Medication Dose Route  Frequency Provider Last Rate Last Dose  . 0.9 %  sodium chloride infusion   Intravenous PRN Lang Snow, NP   Stopped at 09/09/18 [phone]  . acetaminophen (TYLENOL) tablet 650 mg  650 mg Oral Q6H Lang Snow, NP   650 mg at 09/09/18 0529  . albuterol (PROVENTIL) (2.5 MG/3ML) 0.083% nebulizer solution 2.5 mg  2.5 mg Inhalation Q4H PRN Lang Snow, NP      . atorvastatin (LIPITOR) tablet 80 mg  80 mg Oral QPM Lang Snow, NP   80 mg at 09/08/18 1710  . cholecalciferol (VITAMIN D) tablet 1,000 Units  1,000 Units Oral Daily Lang Snow, NP   1,000 Units at 09/08/18 1014  . clopidogrel (PLAVIX) tablet 75 mg  75 mg Oral Daily Lang Snow, NP   75 mg at 09/08/18 1013  . colchicine tablet 0.6 mg  0.6 mg Oral Daily PRN Lang Snow, NP      . diclofenac sodium (VOLTAREN) 1 % transdermal gel 2 g  2 g Topical Q12H PRN Lang Snow, NP   2 g at 09/08/18 1020  . docusate sodium (COLACE) capsule 100 mg  100 mg Oral BID Lang Snow, NP   100 mg at 09/07/18 1015  . enoxaparin (LOVENOX) injection 40 mg  40 mg Subcutaneous Q24H Henreitta Leber, MD   40 mg at 09/08/18 2103  . FLUoxetine (PROZAC) capsule 40 mg  40 mg Oral Daily Lang Snow, NP   40 mg at 09/08/18 1012  . fosfomycin (MONUROL) packet 3 g  3 g Oral Q48H Sainani, Vivek J, MD      . gabapentin (NEURONTIN) capsule 300 mg  300 mg Oral QHS Lang Snow, NP   300 mg at 09/08/18 2101  . guaiFENesin-dextromethorphan (ROBITUSSIN DM) 100-10 MG/5ML syrup 15 mL  15 mL Oral Q4H PRN Henreitta Leber, MD      . hydrALAZINE (APRESOLINE) tablet 25 mg  25 mg Oral TID Lang Snow, NP   25 mg at 09/08/18 1013  . ipratropium (ATROVENT) 0.03 % nasal spray 2 spray  2 spray Each Nare BID Lang Snow, NP   2 spray at 09/08/18 2103  . ipratropium-albuterol (DUONEB) 0.5-2.5 (3) MG/3ML nebulizer solution 3 mL  3 mL Nebulization TID  Lang Snow, NP   3 mL at 09/08/18 2012  . levETIRAcetam (KEPPRA XR) 24 hr tablet 500 mg  500 mg Oral Daily Lang Snow, NP   500 mg at 09/08/18 1014  . linagliptin (TRADJENTA) tablet 5 mg  5 mg Oral Daily Lang Snow, NP   5 mg at 09/08/18 1014  . loratadine (CLARITIN) tablet 10 mg  10 mg Oral Daily Lang Snow, NP   10 mg at 09/08/18 2102  . LORazepam (ATIVAN) injection 1 mg  1 mg Intramuscular Daily PRN Lang Snow, NP      . meclizine (ANTIVERT) tablet 25 mg  25 mg Oral Q12H PRN Lang Snow, NP      . multivitamin with minerals tablet 1 tablet  1 tablet Oral Daily Lang Snow, NP   1 tablet at 09/08/18 1014  .  mupirocin ointment (BACTROBAN) 2 % 1 application  1 application Nasal BID Lang Snow, NP   1 application at 32/12/24 2103  . ondansetron (ZOFRAN) injection 4 mg  4 mg Intravenous Q6H PRN Harrie Foreman, MD      . pantoprazole (PROTONIX) EC tablet 40 mg  40 mg Oral Daily Lang Snow, NP   40 mg at 09/08/18 1015  . predniSONE (DELTASONE) tablet 50 mg  50 mg Oral Q breakfast Henreitta Leber, MD   50 mg at 09/09/18 0848   And  . predniSONE (DELTASONE) tablet 40 mg  40 mg Oral Q breakfast Henreitta Leber, MD   40 mg at 09/09/18 0848   And  . [START ON 09/10/2018] predniSONE (DELTASONE) tablet 30 mg  30 mg Oral Q breakfast Henreitta Leber, MD       And  . Derrill Memo ON 09/11/2018] predniSONE (DELTASONE) tablet 20 mg  20 mg Oral Q breakfast Henreitta Leber, MD       And  . Derrill Memo ON 09/12/2018] predniSONE (DELTASONE) tablet 10 mg  10 mg Oral Q breakfast Sainani, Vivek J, MD      . sodium chloride flush (NS) 0.9 % injection 10-40 mL  10-40 mL Intracatheter Q12H Schuyler Amor, MD   10 mL at 09/08/18 2103  . sodium chloride flush (NS) 0.9 % injection 10-40 mL  10-40 mL Intracatheter PRN Schuyler Amor, MD      . traMADol Veatrice Bourbon) tablet 50 mg  50 mg Oral Q6H PRN Henreitta Leber, MD    50 mg at 09/08/18 2102  . vitamin B-12 (CYANOCOBALAMIN) tablet 1,000 mcg  1,000 mcg Oral Daily Lang Snow, NP   1,000 mcg at 09/08/18 1012     Discharge Medications: Please see discharge summary for a list of discharge medications.  Relevant Imaging Results:  Relevant Lab Results:   Additional Information SS# [id number]  Shelbie Hutching, RN

## 2018-09-09 NOTE — Progress Notes (Signed)
Pt dc to [facility] via EMS.

## 2018-09-09 NOTE — Progress Notes (Signed)
Called report to [facility]. Report received by Benetta Spar. DC education provided to pt and nurse at facility. EMS has been called for transportation services.

## 2018-09-09 NOTE — TOC Initial Note (Signed)
Transition of Care [facility]) - Initial/Assessment Note    Patient Details  Name: Katherine [name] MRN: [id number] Date of Birth: 29-Jun-1940  Transition of Care [facility]) CM/SW Contact:    Shelbie Hutching, RN Phone Number: 09/09/2018, 9:45 AM  Clinical Narrative:                 Patient admitted with ESBL UTI.  Patient is from [facility], she is a long term resident.  Patient is doing well this morning sitting up in bed eating breakfast.  Patient will discharge today back to [facility].  RNCM attempted to contact patient's husband [name] but voicemail box is full.    Expected Discharge Plan: Skilled Nursing Facility Barriers to Discharge: Continued Medical Work up   Patient Goals and CMS Choice        Expected Discharge Plan and Services Expected Discharge Plan: [place]   Discharge Planning Services: CM Consult   Living arrangements for the past 2 months: [place]                                      Prior Living Arrangements/Services Living arrangements for the past 2 months: [place] Lives with:: Facility Resident Patient language and need for interpreter reviewed:: No Do you feel safe going back to the place where you live?: Yes      Need for Family Participation in Patient Care: Yes (Comment)(long term facility resident) Care giver support system in place?: Yes (comment)(husband)   Criminal Activity/Legal Involvement Pertinent to Current Situation/Hospitalization: No - Comment as needed  Activities of Daily Living Home Assistive Devices/Equipment: None ADL Screening (condition at time of admission) Patient's cognitive ability adequate to safely complete daily activities?: Yes Is the patient deaf or have difficulty hearing?: No Does the patient have difficulty seeing, even when wearing glasses/contacts?: No Does the patient have difficulty concentrating, remembering, or making decisions?: No Patient  able to express need for assistance with ADLs?: Yes Does the patient have difficulty dressing or bathing?: Yes Independently performs ADLs?: No Communication: Needs assistance Is this a change from baseline?: Pre-admission baseline Dressing (OT): Needs assistance Is this a change from baseline?: Pre-admission baseline Grooming: Needs assistance Is this a change from baseline?: Pre-admission baseline Feeding: Needs assistance Is this a change from baseline?: Pre-admission baseline Bathing: Needs assistance Is this a change from baseline?: Pre-admission baseline Toileting: Needs assistance Is this a change from baseline?: Pre-admission baseline In/Out Bed: Needs assistance Is this a change from baseline?: Pre-admission baseline Walks in Home: Dependent Is this a change from baseline?: Pre-admission baseline Does the patient have difficulty walking or climbing stairs?: Yes Weakness of Legs: Both Weakness of Arms/Hands: Both  Permission Sought/Granted Permission sought to share information with : Case Manager, Chartered certified accountant granted to share information with : Yes, Verbal Permission Granted     Permission granted to share info w AGENCY: [place]        Emotional Assessment Appearance:: Appears stated age Attitude/Demeanor/Rapport: Engaged Affect (typically observed): Accepting Orientation: : Oriented to Self, Oriented to Place, Oriented to  Time, Oriented to Situation Alcohol / Substance Use: Not Applicable Psych Involvement: No (comment)  Admission diagnosis:  Altered mental status [R41.82] Urinary tract infection without hematuria, site unspecified [N39.0] Patient Active Problem List   Diagnosis Date Noted  . Delirium   . UTI (urinary tract infection) 08/21/2018  . Chest pain 07/15/2018  .  Palliative care encounter 05/10/2018  . Localized edema 05/10/2018  . Shortness of breath 05/10/2018  . Hematemesis 04/28/2018  . Influenza A  04/13/2018  . Acute on chronic congestive heart failure ([place])   . COPD with acute exacerbation ([place]) 02/24/2018  . Chronic diastolic heart failure ([place]) 12/31/2017  . Lymphedema 12/31/2017  . COPD exacerbation ([place]) 11/30/2017  . Urinary tract infection 11/22/2017  . 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[place]) 11/16/2017  . Diabetes mellitus type 2, uncomplicated ([place]) 12/87/8676  . Pressure injury of skin 10/29/2017  . Acute kidney injury superimposed on CKD ([place]) 10/24/2017  . Severe sepsis ([place]) 10/24/2017  . Possible Seizures ([place]) 10/08/2017  . Hyperlipemia 10/08/2017  . 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 Stroke-like episode ([place]) s/p IV tpa 10/04/2017  . Palliative care by specialist   . Elevated rheumatoid factor 09/05/2017  . Frequent hospital admissions 08/22/2017  . Aphasia 06/28/2017  . Hand pain 03/21/2017  . Dry skin 03/21/2017  . Pain in finger of left hand 09/12/2016  . Chronic fatigue 06/12/2016  . Left knee pain 06/12/2016  . Goals of care, counseling/discussion 03/13/2016  . Primary localized osteoarthritis of right knee 02/08/2016  . OSA (obstructive sleep apnea) 09/16/2015  . Rotator cuff syndrome 09/07/2015  . 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 Sleep disturbance 04/14/2015  . Coronary artery disease 03/14/2015  . 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 Acute respiratory failure with hypoxia ([place]) 09/16/2014  . 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 Anxiety   . Tobacco abuse   . Prurigo nodularis   . GERD (gastroesophageal reflux disease)   . COPD (chronic obstructive pulmonary disease) ([place])   . Osteoarthritis   . Vitamin D deficiency disease   . Chronic constipation   . CKD (chronic kidney disease), stage III ([place])   . Essential hypertension 05/20/2013  . Morbid obesity ([place]) 05/20/2013   PCP:  Valerie Roys, DO Pharmacy:   [facility] [phone], [place] AT [place] [address] Phone: [phone] Fax: [phone]     Social Determinants of Health (SDOH) Interventions    Readmission Risk Interventions Readmission Risk Prevention Plan 07/16/2018 04/29/2018 04/29/2018  Transportation Screening Complete Complete Complete  Medication Review Press photographer) Complete Complete Complete  PCP or Specialist appointment within 3-5 days of discharge Complete Not Complete -  PCP/Specialist Appt Not Complete comments - Patient to return to SNF, and they will make appointments  -  [place] or Home Care Consult Complete (No Data) -  SW Recovery Care/Counseling Consult Not Complete (No Data) -  SW Consult Not Complete Comments na - -  Palliative Care Screening Not Applicable Complete -  Medication Reconcilation ([place] Complete Complete -  Some recent data might be hidden

## 2018-09-09 NOTE — Discharge Summary (Signed)
[facility] [place] NAME: Katherine [name]    MR#:  [id number]  DATE OF BIRTH:  08/12/1940  DATE OF ADMISSION:  09/06/2018 ADMITTING PHYSICIAN: Lang Snow, NP  DATE OF DISCHARGE: 09/09/2018  PRIMARY CARE PHYSICIAN: Valerie Roys, DO    ADMISSION DIAGNOSIS:  Altered mental status [R41.82] Urinary tract infection without hematuria, site unspecified [N39.0]  DISCHARGE DIAGNOSIS:  Active Problems:   UTI (urinary tract infection)   SECONDARY DIAGNOSIS:   Past Medical History:  Diagnosis Date  . Angioedema   . Anxiety and depression   . CHF (congestive heart failure) ([place])   . Chronic constipation   . Chronic kidney disease    stage 3  . COPD (chronic obstructive pulmonary disease) ([place])   . Diabetes mellitus without complication ([place])   . Gallstones   . GERD (gastroesophageal reflux disease)   . Hypertension   . Left ventricular hypertrophy   . Osteoarthritis   . Prurigo nodularis   . Seizures ([place])   . Stroke ([place])   . Tobacco abuse   . Vitamin D deficiency disease     HOSPITAL COURSE:   78 year old female with past medical history of previous CVA, seizures, hypertension, GERD, diabetes, COPD, anxiety/depression, CHF, previous history of ESBL UTI who presented to the hospital due to altered mental status.  1.  Altered mental status- this was metabolic in nature suspected be secondary to UTI.  Patient CT head was negative for acute pathology. -Patient was treated with IV antibiotics for the UTI and mental status is improved and is close to baseline now.  2.  Urinary tract infection-patient has a previous history of ESBL UTI.  Her urinalysis on admission was mildly positive.  Clinically she was not symptomatic with fever, leukocytosis. -Patient's urine cultures also came back positive for ESBL.  I think she is likely colonized with ESBL rather than a true infection.  She was empirically given Zosyn for a few days  in the hospital and now being discharged on oral fosfomycin x2 doses.  She only needs 1 more dose of fosfomycin at the skilled nursing facility.  3.  Left knee pain-  patient complained of some significant left knee pain which was not improving with just oral medications.  Obtained x-rays of the left knee which showed degenerative arthritis but no acute fracture.  Orthopedics consult obtained and they thought this was possibly related to gout.  Did not recommend an arthrocentesis. -Patient was started on a prednisone taper and topical pain medications and has improved. -She will not be discharged on a small prednisone taper and continue her colchicine.  4.  History of seizures- no acute activity while in the hospital. - she will Continue Keppra.  5.  Anxiety/depression- she will continue Prozac.  6.  Essential hypertension- she will continue hydralazine.  7.  History of previous CVA- she will continue Plavix, atorvastatin.  8. Neuropathy - she will cont. Gabapentin.   Pt. Is stable for discharge to SNF today.   DISCHARGE CONDITIONS:   Stable.   CONSULTS OBTAINED:  Treatment Team:  Lovell Sheehan, MD  DRUG ALLERGIES:   Allergies  Allergen Reactions  . Bee Venom Swelling  . Enalapril Maleate Swelling    DISCHARGE MEDICATIONS:   Allergies as of 09/09/2018      Reactions   Bee Venom Swelling   Enalapril Maleate Swelling      Medication List    STOP taking these medications   nystatin  100000 UNIT/ML suspension Commonly known as: MYCOSTATIN     TAKE these medications   acetaminophen 650 MG CR tablet Commonly known as: TYLENOL Take 650 mg by mouth 3 (three) times daily.   albuterol 108 (90 Base) MCG/ACT inhaler Commonly known as: VENTOLIN HFA Inhale 2 puffs into the lungs every 4 (four) hours as needed for wheezing or shortness of breath.   albuterol 0.63 MG/3ML nebulizer solution Commonly known as: ACCUNEB Take 3 mLs by nebulization every 8 (eight) hours  as needed for wheezing.   atorvastatin 80 MG tablet Commonly known as: LIPITOR Take 80 mg by mouth every evening.   cetirizine 10 MG tablet Commonly known as: ZYRTEC Take 5 mg by mouth at bedtime.   cholecalciferol 1000 units tablet Commonly known as: VITAMIN D Take 1,000 Units by mouth daily.   clopidogrel 75 MG tablet Commonly known as: PLAVIX Take 75 mg by mouth daily.   colchicine 0.6 MG tablet Take 1 tablet (0.6 mg total) by mouth daily.   cyanocobalamin 1000 MCG tablet Take 1 tablet (1,000 mcg total) by mouth daily.   diclofenac sodium 1 % Gel Commonly known as: VOLTAREN Apply 2 g topically every 12 (twelve) hours as needed (for left knee pain).   docusate sodium 100 MG capsule Commonly known as: COLACE Take 100 mg by mouth 2 (two) times daily.   FLUoxetine 40 MG capsule Commonly known as: PROZAC Take 40 mg by mouth daily.   Fluticasone-Umeclidin-Vilant 100-62.5-25 MCG/INH Aepb Inhale 1 puff into the lungs daily.   fosfomycin 3 g Pack Commonly known as: MONUROL Take 3 g by mouth every other day for 1 dose.   gabapentin 300 MG capsule Commonly known as: NEURONTIN Take 300 mg by mouth at bedtime.   hydrALAZINE 25 MG tablet Commonly known as: APRESOLINE Take 25 mg by mouth 3 (three) times daily.   ipratropium 0.03 % nasal spray Commonly known as: ATROVENT Place 2 sprays into both nostrils 2 (two) times daily.   ipratropium-albuterol 0.5-2.5 (3) MG/3ML Soln Commonly known as: DUONEB Take 3 mLs by nebulization 3 (three) times daily.   levETIRAcetam 500 MG 24 hr tablet Commonly known as: KEPPRA XR Take 1 tablet (500 mg total) by mouth daily.   linagliptin 5 MG Tabs tablet Commonly known as: TRADJENTA Take 5 mg by mouth daily.   LORazepam 2 MG/ML injection Commonly known as: ATIVAN Inject 0.5 mLs (1 mg total) into the muscle daily as needed for seizure.   meclizine 25 MG tablet Commonly known as: ANTIVERT Take 25 mg by mouth every 12 (twelve)  hours as needed for dizziness.   multivitamin with minerals Tabs tablet Take 1 tablet by mouth daily.   mupirocin ointment 2 % Commonly known as: BACTROBAN Place 1 application into the nose 2 (two) times daily.   nitroGLYCERIN 0.4 MG SL tablet Commonly known as: NITROSTAT Place 0.4 mg under the tongue as needed.   pantoprazole 40 MG tablet Commonly known as: PROTONIX Take 1 tablet (40 mg total) by mouth daily.   predniSONE 10 MG tablet Commonly known as: DELTASONE Label  & dispense according to the schedule below.  4 Pills PO for 1 day, 3 Pills PO for 1 day, 2 Pills PO for 1 day, 1 Pill PO for 1 days then STOP.         DISCHARGE INSTRUCTIONS:   DIET:  Cardiac diet and Diabetic diet  DISCHARGE CONDITION:  Stable  ACTIVITY:  Activity as tolerated  OXYGEN:  Home Oxygen: No.  Oxygen Delivery: room air  DISCHARGE LOCATION:  nursing home   If you experience worsening of your admission symptoms, develop shortness of breath, life threatening emergency, suicidal or homicidal thoughts you must seek medical attention immediately by calling 911 or calling your MD immediately  if symptoms less severe.  You Must read complete instructions/literature along with all the possible adverse reactions/side effects for all the Medicines you take and that have been prescribed to you. Take any new Medicines after you have completely understood and accpet all the possible adverse reactions/side effects.   Please note  You were cared for by a hospitalist during your hospital stay. If you have any questions about your discharge medications or the care you received while you were in the hospital after you are discharged, you can call the unit and asked to speak with the hospitalist on call if the hospitalist that took care of you is not available. Once you are discharged, your primary care physician will handle any further medical issues. Please note that NO REFILLS for any discharge  medications will be authorized once you are discharged, as it is imperative that you return to your primary care physician (or establish a relationship with a primary care physician if you do not have one) for your aftercare needs so that they can reassess your need for medications and monitor your lab values.     Today   Left knee pain is improved, patient is afebrile and hemodynamically stable.  Mental status is at baseline.  Will discharge back to skilled nursing facility today.  VITAL SIGNS:  Blood pressure (!) 144/93, pulse 89, temperature 97.7 F (36.5 C), temperature source Oral, resp. rate 19, height 5\' 6"  (1.676 m), weight 105.7 kg, SpO2 100 %.  I/O:    Intake/Output Summary (Last 24 hours) at 09/09/2018 1029 Last data filed at 09/09/2018 9211 Gross per 24 hour  Intake 185.61 ml  Output 300 ml  Net -114.39 ml    PHYSICAL EXAMINATION:   GENERAL:  78 y.o.-year-old obese patient lying in bed in no acute distress.  EYES: Pupils equal, round, reactive to light and accommodation. No scleral icterus. Extraocular muscles intact.  HEENT: Head atraumatic, normocephalic. Oropharynx and nasopharynx clear.  NECK:  Supple, no jugular venous distention. No thyroid enlargement, no tenderness.  LUNGS: Normal breath sounds bilaterally, no wheezing, rales, rhonchi. No use of accessory muscles of respiration.  CARDIOVASCULAR: S1, S2 normal. No murmurs, rubs, or gallops.  ABDOMEN: Soft, nontender, nondistended. Bowel sounds present. No organomegaly or mass.  EXTREMITIES: No cyanosis, clubbing. + 1-2 edema b/l.  Left knee swelling but no warmth or redness. NEUROLOGIC: Cranial nerves II through XII are intact. No focal Motor or sensory deficits b/l. Globally weak.  PSYCHIATRIC: The patient is alert and oriented x 3.  SKIN: No obvious rash, lesion, or ulcer.   DATA REVIEW:   CBC Recent Labs  Lab 09/08/18 0626  WBC 5.6  HGB 8.1*  HCT 25.9*  PLT 356    Chemistries  Recent Labs  Lab  09/06/18 1358  NA 139  K 4.5  CL 105  CO2 27  GLUCOSE 92  BUN 21  CREATININE 1.62*  CALCIUM 9.4  AST 36  ALT 22  ALKPHOS 154*  BILITOT 0.5    Cardiac Enzymes No results for input(s): TROPONINI in the last 168 hours.  Microbiology Results  Results for orders placed or performed during the hospital encounter of 09/06/18  SARS Coronavirus 2 (CEPHEID - Performed in [facility] hospital  lab), Hosp Order     Status: None   Collection Time: 09/06/18 12:22 PM   Specimen: Nasopharyngeal Swab  Result Value Ref Range Status   SARS Coronavirus 2 NEGATIVE NEGATIVE Final    Comment: (NOTE) If result is NEGATIVE SARS-CoV-2 target nucleic acids are NOT DETECTED. The SARS-CoV-2 RNA is generally detectable in upper and lower  respiratory specimens during the acute phase of infection. The lowest  concentration of SARS-CoV-2 viral copies this assay can detect is 250  copies / mL. A negative result does not preclude SARS-CoV-2 infection  and should not be used as the sole basis for treatment or other  patient management decisions.  A negative result may occur with  improper specimen collection / handling, submission of specimen other  than nasopharyngeal swab, presence of viral mutation(s) within the  areas targeted by this assay, and inadequate number of viral copies  (<250 copies / mL). A negative result must be combined with clinical  observations, patient history, and epidemiological information. If result is POSITIVE SARS-CoV-2 target nucleic acids are DETECTED. The SARS-CoV-2 RNA is generally detectable in upper and lower  respiratory specimens dur ing the acute phase of infection.  Positive  results are indicative of active infection with SARS-CoV-2.  Clinical  correlation with patient history and other diagnostic information is  necessary to determine patient infection status.  Positive results do  not rule out bacterial infection or co-infection with other viruses. If result is  PRESUMPTIVE POSTIVE SARS-CoV-2 nucleic acids MAY BE PRESENT.   A presumptive positive result was obtained on the submitted specimen  and confirmed on repeat testing.  While 2019 novel coronavirus  (SARS-CoV-2) nucleic acids may be present in the submitted sample  additional confirmatory testing may be necessary for epidemiological  and / or clinical management purposes  to differentiate between  SARS-CoV-2 and other Sarbecovirus currently known to infect humans.  If clinically indicated additional testing with an alternate test  methodology [phone]) is advised. The SARS-CoV-2 RNA is generally  detectable in upper and lower respiratory sp ecimens during the acute  phase of infection. The expected result is Negative. Fact Sheet for Patients:  StrictlyIdeas.no Fact Sheet for Healthcare Providers: BankingDealers.co.za This test is not yet approved or cleared by the [place] FDA and has been authorized for detection and/or diagnosis of SARS-CoV-2 by FDA under an Emergency Use Authorization (EUA).  This EUA will remain in effect (meaning this test can be used) for the duration of the COVID-19 declaration under Section 564(b)(1) of the Act, 21 U.S.C. section 360bbb-3(b)(1), unless the authorization is terminated or revoked sooner. Performed at [facility], [address] [place], [place] [address]   Urine culture     Status: Abnormal   Collection Time: 09/06/18 12:22 PM   Specimen: Urine, Random  Result Value Ref Range Status   Specimen Description   Final    URINE, RANDOM Performed at [facility], [address] [place], [place] [address]    Special Requests   Final    NONE Performed at [facility], [address] [place], [place] [address]    Culture (A)  Final    >=100,000 COLONIES/mL ESCHERICHIA COLI Confirmed Extended Spectrum Beta-Lactamase Producer (ESBL).  In bloodstream infections from  ESBL organisms, carbapenems are preferred over piperacillin/tazobactam. They are shown to have a lower risk of mortality.    Report Status 09/08/2018 FINAL  Final   Organism ID, Bacteria ESCHERICHIA COLI (A)  Final  Susceptibility   Escherichia coli - MIC*    AMPICILLIN >=32 RESISTANT Resistant     CEFAZOLIN >=64 RESISTANT Resistant     CEFTRIAXONE >=64 RESISTANT Resistant     CIPROFLOXACIN >=4 RESISTANT Resistant     GENTAMICIN >=16 RESISTANT Resistant     IMIPENEM <=0.25 SENSITIVE Sensitive     NITROFURANTOIN <=16 SENSITIVE Sensitive     TRIMETH/SULFA >=320 RESISTANT Resistant     AMPICILLIN/SULBACTAM >=32 RESISTANT Resistant     PIP/TAZO 8 SENSITIVE Sensitive     Extended ESBL POSITIVE Resistant     * >=100,000 COLONIES/mL ESCHERICHIA COLI    RADIOLOGY:  US Venous Img Upper Uni Left  Result Date: 09/09/2018 CLINICAL DATA:  78 year old female with left upper extremity pain and swelling for 1 day EXAM: LEFT UPPER EXTREMITY VENOUS DOPPLER ULTRASOUND TECHNIQUE: Gray-scale sonography with graded compression, as well as color Doppler and duplex ultrasound were performed to evaluate the upper extremity deep venous system from the level of the subclavian vein and including the jugular, axillary, basilic, radial, ulnar and upper cephalic vein. Spectral Doppler was utilized to evaluate flow at rest and with distal augmentation maneuvers. COMPARISON:  None. FINDINGS: Contralateral Subclavian Vein: Respiratory phasicity is normal and symmetric with the symptomatic side. No evidence of thrombus. Normal compressibility. Internal Jugular Vein: No evidence of thrombus. Normal compressibility, respiratory phasicity and response to augmentation. Subclavian Vein: No evidence of thrombus. Normal compressibility, respiratory phasicity and response to augmentation. Axillary Vein: No evidence of thrombus. Normal compressibility, respiratory phasicity and response to augmentation. Cephalic Vein: None  visualized.  May be congenitally absent. Basilic Vein: No evidence of thrombus. Normal compressibility, respiratory phasicity and response to augmentation. Brachial Veins: No evidence of thrombus. Normal compressibility, respiratory phasicity and response to augmentation. Radial Veins: No evidence of thrombus. Normal compressibility, respiratory phasicity and response to augmentation. Ulnar Veins: No evidence of thrombus. Normal compressibility, respiratory phasicity and response to augmentation. Venous Reflux:  None visualized. Other Findings:  None visualized. IMPRESSION: No evidence of DVT within the left upper extremity. Electronically Signed   By: Jacqulynn Cadet M.D.   On: 09/09/2018 08:10   Dg Knee Left Port  Result Date: 09/07/2018 CLINICAL DATA:  Chronic knee pain, swelling EXAM: PORTABLE LEFT KNEE - 1-2 VIEW COMPARISON:  None. FINDINGS: No fracture or dislocation of the left knee. Moderate tricompartmental joint space narrowing and osteophytosis. Small nonspecific knee joint effusion. Soft tissue edema over the anterior extensor mechanism. IMPRESSION: No fracture or dislocation of the left knee. Moderate tricompartmental joint space narrowing and osteophytosis. Small nonspecific knee joint effusion. Soft tissue edema over the anterior extensor mechanism. Electronically Signed   By: Eddie Candle M.D.   On: 09/07/2018 13:52      Management plans discussed with the patient, family and they are in agreement.  CODE STATUS:     Code Status Orders  (From admission, onward)         Start     Ordered   09/06/18 1446  Full code  Continuous     09/06/18 1447        TOTAL TIME TAKING CARE OF THIS PATIENT: 40 minutes.    Henreitta Leber M.D on 09/09/2018 at 10:29 AM  Between 7am to 6pm - Pager - [phone]  After 6pm go to [email or web address] - Technical brewer [facility]  Office  [phone]  CC: Primary care physician; Valerie Roys, DO

## 2018-09-09 NOTE — TOC Transition Note (Signed)
Transition of Care [facility]) - CM/SW Discharge Note   Patient Details  Name: Katherine [name] MRN: [id number] Date of Birth: 1940-09-01  [facility] CM/SW Contact:  Shelbie Hutching, RN Phone Number: 09/09/2018, 11:36 AM   Clinical Narrative:    Patient ready for discharge back to [facility].  Patient will discharge via EMS.  Bedside RN to call report to [phone], patient going to [facility] 29A.   Final next level of care: Skilled Nursing Facility Barriers to Discharge: Barriers Resolved   Patient Goals and CMS Choice        Discharge Placement              Patient chooses bed at: [facility] Patient to be transferred to facility by: [facility] Name of family member notified: Husband, [name] Patient and family notified of of transfer: 09/09/18  Discharge Plan and Services   Discharge Planning Services: CM Consult                                 Social Determinants of Health (SDOH) Interventions     Readmission Risk Interventions Readmission Risk Prevention Plan 07/16/2018 04/29/2018 04/29/2018  Transportation Screening Complete Complete Complete  Medication Review Press photographer) Complete Complete Complete  PCP or Specialist appointment within 3-5 days of discharge Complete Not Complete -  PCP/Specialist Appt Not Complete comments - Patient to return to SNF, and they will make appointments  -  [place] or Home Care Consult Complete (No Data) -  SW Recovery Care/Counseling Consult Not Complete (No Data) -  SW Consult Not Complete Comments na - -  Palliative Care Screening Not Applicable Complete -  Medication Reconcilation ([place] Complete Complete -  Some recent data might be hidden

## 2018-09-09 NOTE — Care Management Important Message (Signed)
Important Message  Patient Details  Name: Baelynn Schmuhl MRN: [id number] Date of Birth: 23-Jul-1940   Medicare Important Message Given:  Yes  Reviewed with patient at the bedside. Patient verbalizes understanding.      Shelbie Hutching, RN 09/09/2018, 11:49 AM

## 2018-09-11 ENCOUNTER — Encounter: Payer: Self-pay | Admitting: Nurse Practitioner

## 2018-09-11 ENCOUNTER — Non-Acute Institutional Stay: Payer: Medicare HMO | Admitting: Nurse Practitioner

## 2018-09-11 ENCOUNTER — Other Ambulatory Visit: Payer: Self-pay

## 2018-09-11 VITALS — BP 148/78 | HR 82 | Temp 98.0°F | Resp 18 | Wt 243.7 lb

## 2018-09-11 DIAGNOSIS — E1322 Other specified diabetes mellitus with diabetic chronic kidney disease: Secondary | ICD-10-CM | POA: Diagnosis not present

## 2018-09-11 DIAGNOSIS — Z515 Encounter for palliative care: Secondary | ICD-10-CM

## 2018-09-11 DIAGNOSIS — G40909 Epilepsy, unspecified, not intractable, without status epilepticus: Secondary | ICD-10-CM | POA: Diagnosis not present

## 2018-09-11 DIAGNOSIS — R601 Generalized edema: Secondary | ICD-10-CM

## 2018-09-11 DIAGNOSIS — I129 Hypertensive chronic kidney disease with stage 1 through stage 4 chronic kidney disease, or unspecified chronic kidney disease: Secondary | ICD-10-CM | POA: Diagnosis not present

## 2018-09-11 DIAGNOSIS — N390 Urinary tract infection, site not specified: Secondary | ICD-10-CM | POA: Diagnosis not present

## 2018-09-11 DIAGNOSIS — R0602 Shortness of breath: Secondary | ICD-10-CM

## 2018-09-11 NOTE — Progress Notes (Signed)
[employer] Palliative Care Consult Note Telephone: [phone]  Fax: [phone]  PATIENT NAME: Katherine [name] DOB: Oct 27, 1940 MRN: [id number]  PRIMARY CARE PROVIDER:   Dr Yves Dill PROVIDER:  Dr Hodges/[facility] RESPONSIBLE PARTY:   Self/[name] spouse [phone]  RECOMMENDATIONS and PLAN: 1.Palliative care encounter Z51.5; Palliative medicine team will continue to support patient, patient's family, and medical team. Visit consisted of counseling and education dealing with the complex and emotionally intense issues of symptom management and palliative care in the setting of serious and potentially life-threatening illness  2.Edema R60.9 secondary toCHF, monitor weights, elevate  3.Shortness of breath R06.02 secondary toCHF, continue diuresing, inhalation therapy; Continuous O2,Continue daily weights   ASSESSMENT:     I visited and observed Katherine [name]. We talked about purpose of palliative care visit and she was in agreement. We talked about how she was feeling. She verbalize that she's feeling a little better. She continues to have difficulty with intermittent shortness of breath but the nebulizer treatments to help and oxygen. We talked about past medical history in the setting of chronic disease, progression. We talked about recent hospitalization. We talked about symptoms of pain which seem to be controlled with Voltaren gel. We talked about her appetite being declined. We talked about short-term rehab in her expectations. She shared that she does want to work with therapy and hopes to return home. We talked about medical goals of care including aggressive versus conservative versus comfort care. We talked about code status as she remains a full code. Her wishes are to have full of aggressive interventions including intubation, hospitalization, IV fluids, antibiotics including resuscitation. She wants to continue to  be a full code. We talked about role of palliative care and plan of care. Discussed will follow up in 2 weeks if needed or sooner should she declined. Katherine [name] in agreement. Therapeutic listening and emotional support provided. Questions answered to satisfaction. I updated nursing staff no new changes to current goals or plan of care will continue to monitor and follow during short-term rehab with palliative care.   I spent 60 minutes providing this consultation,  from 10:00am to 11:00am. More than 50% of the time in this consultation was spent coordinating communication.   HISTORY OF PRESENT ILLNESS:  Katherine [name] is a 78 y.o. year old female with multiple medical problems including Diastolic congestive heart failure, left ventricular hypertrophy, aneurysm of anterior com artery, pallapa vocal cord, obstructive sleep apnea, hypoventilation syndrome secondary to obesity, late onset CVA, seizure disorder, COPD, pulmonary scarring,lymphedema, diabetes, chronic kidney disease, hypertension, osteoarthritis, gerd, gallstones, rotator cuff syndrome, lymphedema, lichens simplex chronicus , history of angioedema, anxiety, depression, vitamin D deficiency, right total knee arthroplasty, polypectomy, cholecystectomy, tobacco abuse. She has had multiple hospitalization with last Katherine [name] continues to reside at [place] at [facility]. She does require assistance with mobility, turning, positioning. She is total ADL dependence. She does feed herself appetite has been fair to poor. She does remain on 2 liters of oxygen. She had a recent hospitalization 7 / 24 / 2020 to 7 / 27 / 2020 for altered mental status, urinary tract infection. She was treated with IV antibiotics and mental status improved. She does have a previous history of esbl UTI though admission urinalysis was mildly positive. She presented with fever leukocytosis in urine culture also came back positive for esbl. Possibly  colonize rather than ESBL true infection. Left knee pain with x-ray showing  degenerative arthritis, no fracture. Orthopedics consulted and thought possibly get out. She was started on prednisone and Voltaren gel. History of seizure with no seizure activity during hospitalization. She does take Prozac for anxiety and depression. Previous CVA continued on Plavix and atorvastatin. She is on gabapentin for neuropathy. She was discharged back to [facility] where she currently resides. Staff endorses she is able to verbalize her needs as she is her own responsible party. At present she is lying in bed. She appears chronically ill, obese but no distress. No visitors present. Palliative Care was asked to help to continue to address goals of care.   CODE STATUS: Full  PPS: 30% HOSPICE ELIGIBILITY/DIAGNOSIS: TBD  PAST MEDICAL HISTORY:  Past Medical History:  Diagnosis Date   Angioedema    Anxiety and depression    CHF (congestive heart failure) (HCC)    Chronic constipation    Chronic kidney disease    stage 3   COPD (chronic obstructive pulmonary disease) (HCC)    Diabetes mellitus without complication (HCC)    Gallstones    GERD (gastroesophageal reflux disease)    Hypertension    Left ventricular hypertrophy    Osteoarthritis    Prurigo nodularis    Seizures ([place])    Stroke (HCC)    Tobacco abuse    Vitamin D deficiency disease     SOCIAL HX:  Social History   Tobacco Use   Smoking status: Former Smoker    Packs/day: 0.50    Years: 60.00    Pack years: 30.00    Types: Cigarettes   Smokeless tobacco: Never Used   Tobacco comment: quite 55mo ago-03/26/18  Substance Use Topics   Alcohol use: No    Alcohol/week: 0.0 standard drinks    Comment: rare    ALLERGIES:  Allergies  Allergen Reactions   Bee Venom Swelling   Enalapril Maleate Swelling     PERTINENT MEDICATIONS:  Outpatient Encounter Medications as of 09/11/2018  Medication Sig    acetaminophen (TYLENOL) 650 MG CR tablet Take 650 mg by mouth 3 (three) times daily.    albuterol (ACCUNEB) 0.63 MG/3ML nebulizer solution Take 3 mLs by nebulization every 8 (eight) hours as needed for wheezing.    albuterol (PROVENTIL HFA;VENTOLIN HFA) 108 (90 Base) MCG/ACT inhaler Inhale 2 puffs into the lungs every 4 (four) hours as needed for wheezing or shortness of breath.   atorvastatin (LIPITOR) 80 MG tablet Take 80 mg by mouth every evening.    cetirizine (ZYRTEC) 10 MG tablet Take 5 mg by mouth at bedtime.   cholecalciferol (VITAMIN D) 1000 units tablet Take 1,000 Units by mouth daily.   clopidogrel (PLAVIX) 75 MG tablet Take 75 mg by mouth daily.   diclofenac sodium (VOLTAREN) 1 % GEL Apply 2 g topically every 12 (twelve) hours as needed (for left knee pain).    docusate sodium (COLACE) 100 MG capsule Take 100 mg by mouth 2 (two) times daily.   FLUoxetine (PROZAC) 40 MG capsule Take 40 mg by mouth daily.    Fluticasone-Umeclidin-Vilant 100-62.5-25 MCG/INH AEPB Inhale 1 puff into the lungs daily.   gabapentin (NEURONTIN) 300 MG capsule Take 300 mg by mouth at bedtime.   hydrALAZINE (APRESOLINE) 25 MG tablet Take 25 mg by mouth 3 (three) times daily.   ipratropium (ATROVENT) 0.03 % nasal spray Place 2 sprays into both nostrils 2 (two) times daily.    ipratropium-albuterol (DUONEB) 0.5-2.5 (3) MG/3ML SOLN Take 3 mLs by nebulization 3 (three) times daily.  levETIRAcetam (KEPPRA XR) 500 MG 24 hr tablet Take 1 tablet (500 mg total) by mouth daily.   linagliptin (TRADJENTA) 5 MG TABS tablet Take 5 mg by mouth daily.   LORazepam (ATIVAN) 2 MG/ML injection Inject 0.5 mLs (1 mg total) into the muscle daily as needed for seizure.   meclizine (ANTIVERT) 25 MG tablet Take 25 mg by mouth every 12 (twelve) hours as needed for dizziness.    Multiple Vitamin (MULTIVITAMIN WITH MINERALS) TABS tablet Take 1 tablet by mouth daily.   mupirocin ointment (BACTROBAN) 2 % Place 1  application into the nose 2 (two) times daily.   nitroGLYCERIN (NITROSTAT) 0.4 MG SL tablet Place 0.4 mg under the tongue as needed.   pantoprazole (PROTONIX) 40 MG tablet Take 1 tablet (40 mg total) by mouth daily.   predniSONE (DELTASONE) 10 MG tablet Label  & dispense according to the schedule below.  4 Pills PO for 1 day, 3 Pills PO for 1 day, 2 Pills PO for 1 day, 1 Pill PO for 1 days then STOP.   vitamin B-12 1000 MCG tablet Take 1 tablet (1,000 mcg total) by mouth daily.   colchicine 0.6 MG tablet Take 1 tablet (0.6 mg total) by mouth daily. (Patient not taking: Reported on 09/11/2018)   No facility-administered encounter medications on file as of 09/11/2018.     PHYSICAL EXAM:   General: obese, chronically ill pleasant female, O2 dependent Cardiovascular: regular rate and rhythm Pulmonary: clear ant fields Abdomen: soft, nontender, + bowel sounds Extremities: no edema, no joint deformities Neurological: Weakness but otherwise nonfocal/non-ambulatory  Kacy Hegna Ihor Gully, NP

## 2018-09-12 DIAGNOSIS — J449 Chronic obstructive pulmonary disease, unspecified: Secondary | ICD-10-CM | POA: Diagnosis not present

## 2018-09-14 DIAGNOSIS — R6520 Severe sepsis without septic shock: Secondary | ICD-10-CM | POA: Diagnosis not present

## 2018-09-14 DIAGNOSIS — N3000 Acute cystitis without hematuria: Secondary | ICD-10-CM | POA: Diagnosis not present

## 2018-09-14 DIAGNOSIS — R569 Unspecified convulsions: Secondary | ICD-10-CM | POA: Diagnosis not present

## 2018-09-14 DIAGNOSIS — D649 Anemia, unspecified: Secondary | ICD-10-CM | POA: Diagnosis not present

## 2018-09-17 ENCOUNTER — Other Ambulatory Visit: Payer: Self-pay | Admitting: Unknown Physician Specialty

## 2018-09-17 NOTE — Telephone Encounter (Signed)
Routing to provider  

## 2018-09-17 NOTE — Telephone Encounter (Signed)
Patient's husband notified  

## 2018-09-17 NOTE — Telephone Encounter (Signed)
I'm pretty sure she's in SNF- can we confirm. If so- they need to refill. If not, she needs appointment. I haven't seen her in months.

## 2018-09-17 NOTE — Telephone Encounter (Signed)
Both medications requested are listed as historical on medication list.  Will forward to PCP refill pool.   Requested Prescriptions  Pending Prescriptions Disp Refills   hydrALAZINE (APRESOLINE) 25 MG tablet [Pharmacy Med Name: HYDRALAZINE  25MG  TABLETS(ORANGE)] 90 tablet     Sig: TAKE 1 TABLET(25 MG) BY MOUTH THREE TIMES DAILY     Cardiovascular:  Vasodilators Failed - 09/17/2018 12:55 PM      Failed - HCT in normal range and within 360 days    HCT  Date Value Ref Range Status  09/08/2018 25.9 (L) 36.0 - 46.0 % Final   Hematocrit  Date Value Ref Range Status  03/27/2017 37.9 34.0 - 46.6 % Final         Failed - HGB in normal range and within 360 days    Hemoglobin  Date Value Ref Range Status  09/08/2018 8.1 (L) 12.0 - 15.0 g/dL Final  03/27/2017 12.6 11.1 - 15.9 g/dL Final         Failed - RBC in normal range and within 360 days    RBC  Date Value Ref Range Status  09/08/2018 3.03 (L) 3.87 - 5.11 MIL/uL Final         Failed - Last BP in normal range    BP Readings from Last 1 Encounters:  09/11/18 (!) 148/78         Passed - WBC in normal range and within 360 days    WBC  Date Value Ref Range Status  09/08/2018 5.6 4.0 - 10.5 K/uL Final         Passed - PLT in normal range and within 360 days    Platelets  Date Value Ref Range Status  09/08/2018 356 150 - 400 K/uL Final  03/27/2017 252 150 - 379 x10E3/uL Final         Passed - Valid encounter within last 12 months    Recent Outpatient Visits          10 months ago Hospital discharge follow-up   [facility] [name], NP   10 months ago Interstitial pulmonary disease [facility])   [place], Megan P, DO   11 months ago Chronic obstructive pulmonary disease, unspecified COPD type ([place])   [place], Megan P, DO   1 year ago Chronic obstructive pulmonary disease, unspecified COPD type ([place])   [place] [name], NP   1 year ago  Pain of left hand   [facility] [name], NP              clopidogrel (PLAVIX) 75 MG tablet [Pharmacy Med Name: CLOPIDOGREL 75MG  TABLETS] 90 tablet     Sig: TAKE 1 TABLET(75 MG) BY MOUTH DAILY     Hematology: Antiplatelets - clopidogrel Failed - 09/17/2018 12:55 PM      Failed - Evaluate AST, ALT within 2 months of therapy initiation.      Failed - HCT in normal range and within 180 days    HCT  Date Value Ref Range Status  09/08/2018 25.9 (L) 36.0 - 46.0 % Final   Hematocrit  Date Value Ref Range Status  03/27/2017 37.9 34.0 - 46.6 % Final         Failed - HGB in normal range and within 180 days    Hemoglobin  Date Value Ref Range Status  09/08/2018 8.1 (L) 12.0 - 15.0 g/dL Final  03/27/2017 12.6 11.1 - 15.9 g/dL Final  Failed - Valid encounter within last 6 months    Recent Outpatient Visits          10 months ago Hospital discharge follow-up   [facility] [name], NP   10 months ago Interstitial pulmonary disease [facility])   [place], Megan P, DO   11 months ago Chronic obstructive pulmonary disease, unspecified COPD type ([place])   [place], Megan P, DO   1 year ago Chronic obstructive pulmonary disease, unspecified COPD type ([place])   [place] [name], NP   1 year ago Pain of left hand   [facility] [name], NP             Passed - ALT in normal range and within 360 days    ALT  Date Value Ref Range Status  09/06/2018 22 0 - 44 U/L Final   SGPT (ALT)  Date Value Ref Range Status  01/22/2013 15 12 - 78 U/L Final         Passed - AST in normal range and within 360 days    AST  Date Value Ref Range Status  09/06/2018 36 15 - 41 U/L Final   SGOT(AST)  Date Value Ref Range Status  01/22/2013 22 15 - 37 Unit/L Final         Passed - PLT in normal range and within 180 days    Platelets  Date Value Ref Range Status   09/08/2018 356 150 - 400 K/uL Final  03/27/2017 252 150 - 379 x10E3/uL Final

## 2018-09-18 DIAGNOSIS — D649 Anemia, unspecified: Secondary | ICD-10-CM | POA: Diagnosis not present

## 2018-09-18 DIAGNOSIS — N3000 Acute cystitis without hematuria: Secondary | ICD-10-CM | POA: Diagnosis not present

## 2018-09-18 DIAGNOSIS — R569 Unspecified convulsions: Secondary | ICD-10-CM | POA: Diagnosis not present

## 2018-09-20 DIAGNOSIS — Z20828 Contact with and (suspected) exposure to other viral communicable diseases: Secondary | ICD-10-CM | POA: Diagnosis not present

## 2018-09-23 DIAGNOSIS — N390 Urinary tract infection, site not specified: Secondary | ICD-10-CM | POA: Diagnosis not present

## 2018-09-23 DIAGNOSIS — Z79899 Other long term (current) drug therapy: Secondary | ICD-10-CM | POA: Diagnosis not present

## 2018-09-23 DIAGNOSIS — R319 Hematuria, unspecified: Secondary | ICD-10-CM | POA: Diagnosis not present

## 2018-10-13 DIAGNOSIS — J449 Chronic obstructive pulmonary disease, unspecified: Secondary | ICD-10-CM | POA: Diagnosis not present

## 2018-10-14 DIAGNOSIS — R5381 Other malaise: Secondary | ICD-10-CM | POA: Diagnosis not present

## 2018-10-16 DIAGNOSIS — R319 Hematuria, unspecified: Secondary | ICD-10-CM | POA: Diagnosis not present

## 2018-10-16 DIAGNOSIS — N390 Urinary tract infection, site not specified: Secondary | ICD-10-CM | POA: Diagnosis not present

## 2018-10-19 DIAGNOSIS — Z5181 Encounter for therapeutic drug level monitoring: Secondary | ICD-10-CM | POA: Diagnosis not present

## 2018-10-19 DIAGNOSIS — Z79899 Other long term (current) drug therapy: Secondary | ICD-10-CM | POA: Diagnosis not present

## 2018-11-01 ENCOUNTER — Other Ambulatory Visit: Payer: Self-pay

## 2018-11-01 ENCOUNTER — Non-Acute Institutional Stay: Payer: Medicare HMO | Admitting: Nurse Practitioner

## 2018-11-01 ENCOUNTER — Encounter: Payer: Self-pay | Admitting: Nurse Practitioner

## 2018-11-01 VITALS — BP 106/62 | HR 90 | Temp 97.5°F | Resp 18 | Wt 229.5 lb

## 2018-11-01 DIAGNOSIS — Z515 Encounter for palliative care: Secondary | ICD-10-CM | POA: Diagnosis not present

## 2018-11-01 NOTE — Progress Notes (Signed)
[place] Consult Note Telephone: [phone]  Fax: [phone]  PATIENT NAME: Katherine [name] DOB: 1940-05-18 MRN: [id number]  PRIMARY CARE PROVIDER:   Valerie Roys, DO Dr Hodges/[place] RESPONSIBLE PARTY:   Self/[name] Katherine [name] spouse [phone]  RECOMMENDATIONS and PLAN: 1.ACP: remains a full code; most form in ACP  2.Edema secondary toCHF, monitor weights, elevate  3.Shortness of breathsecondary toCHF, continue diuresing, inhalation therapy; Continuous O2,Continue daily weights   4. Palliative care encounter Z51.5; Palliative medicine team will continue to support patient, patient's family, and medical team. Visit consisted of counseling and education dealing with the complex and emotionally intense issues of symptom management and palliative care in the setting of serious and potentially life-threatening illness  I spent 45 minutes providing this consultation,  from 9:15am to 10:00am. More than 50% of the time in this consultation was spent coordinating communication.   HISTORY OF PRESENT ILLNESS:  Katherine [name] is a 78 y.o. 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 Katherine [name] continues to reside in [place] at [facility]. Recent hospitalization 7 / 24 / 2020 to 7 / 27 / 2020 for altered mental status  metabolic and nature suspected secondary urinary tract infection treated with antibiotics with mental status improved. CT brain- for acute pathology. Left knee pain with no acute fracture create a web for all medications. Orthopedics was consulted and they thought possibly get out and placed on steroids with topical pain management. History of seizures though no activity during hospitalization remains on Keppra. She was just heard back to [facility] where she resides long-term care. She does require assistance with positioning, turning, mobility secondary to obesity. She is ADL dependent. She does feed herself after tray setup. Weight has been stable. She does remain equal code in a most form has been completed. She is able to verbalize your needs. She does remain oxygen-dependent. At present she is lying in bed. She appears debilitated but comfortable, no distress. No visitors present. I visited and observed Katherine. [name]. We talked about purpose for palliative care visit, Katherine. Hunke in agreement. We talked about how she was feeling today. She verbalize that she's doing okay but she's ready to get out of bed. We talked about mobility. She shared that she does require assistance from the staff to help turn, position and transfer her. We talked about challenges with dependency on Caregivers for all basic needs, loss of [place]. We talked about symptoms of pain and shortness of breath what she denies. We talked about chronic disease progression, 02 dependency. We talked about for appetite which is good. We talked about recent hospitalization. We talked about transition to long-term care. We talked about medical goals of care and her wishes are terminate full code how much weight what is treatable. We talked about role of palliative care plan of care. Discuss that will follow up in 2 months if needed or sooner should she declined. Katherine [name] in agreement. I have updated nursing staffing any changes to  current goals or plan of care. She has her  own responsible party.  Palliative Care was asked to help to continue to address goals of care.   CODE STATUS: Full   PPS: 30% HOSPICE ELIGIBILITY/DIAGNOSIS: TBD  PAST MEDICAL HISTORY:  Past Medical History:  Diagnosis Date  . Angioedema   . Anxiety and depression   . CHF (congestive heart failure) ([place])   . Chronic constipation   . Chronic kidney disease    stage 3  . COPD (chronic obstructive pulmonary disease) ([place])   . Diabetes mellitus without complication ([place])   . Gallstones   . GERD (gastroesophageal reflux disease)   . Hypertension   . Left ventricular hypertrophy   . Osteoarthritis   . Prurigo nodularis   . Seizures ([place])   . Stroke ([place])   . Tobacco abuse   . Vitamin D deficiency disease     SOCIAL HX:  Social History   Tobacco Use  . Smoking status: Former Smoker    Packs/day: 0.50    Years: 60.00    Pack years: 30.00    Types: Cigarettes  . Smokeless tobacco: Never Used  . Tobacco comment: quite 60mo ago-03/26/18  Substance Use Topics  . Alcohol use: No    Alcohol/week: 0.0 standard drinks    Comment: rare    ALLERGIES:  Allergies  Allergen Reactions  . Bee Venom Swelling  . Enalapril Maleate Swelling     PERTINENT MEDICATIONS:  Outpatient Encounter Medications as of 11/01/2018  Medication Sig  . acetaminophen (TYLENOL) 650 MG CR tablet Take 650 mg by mouth 3 (three) times daily.   [name] albuterol (ACCUNEB) 0.63 MG/3ML nebulizer solution Take 3 mLs by nebulization every 8 (eight) hours as needed for wheezing.   [name] albuterol (PROVENTIL HFA;VENTOLIN HFA) 108 (90 Base) MCG/ACT inhaler Inhale 2 puffs into the lungs every 4 (four) hours as needed for wheezing or shortness of breath.  [name] atorvastatin (LIPITOR) 80 MG tablet Take 80 mg by mouth every evening.   . cetirizine (ZYRTEC) 10 MG tablet Take 5 mg by mouth at bedtime.  . cholecalciferol (VITAMIN D) 1000 units tablet Take 1,000 Units by mouth daily.  .  clopidogrel (PLAVIX) 75 MG tablet Take 75 mg by mouth daily.  . colchicine 0.6 MG tablet Take 1 tablet (0.6 mg total) by mouth daily. (Patient not taking: Reported on 09/11/2018)  . diclofenac sodium (VOLTAREN) 1 % GEL Apply 2 g topically every 12 (twelve) hours as needed (for left knee pain).   [name] docusate sodium (COLACE) 100 MG capsule Take 100 mg by mouth 2 (two) times daily.  [name] FLUoxetine (PROZAC) 40 MG capsule Take 40 mg by mouth daily.   . Fluticasone-Umeclidin-Vilant 100-62.5-25 MCG/INH AEPB Inhale 1 puff into the lungs daily.  [name] gabapentin (NEURONTIN) 300 MG capsule Take 300 mg by mouth at bedtime.  . hydrALAZINE (APRESOLINE) 25 MG tablet Take 25 mg by mouth 3 (three) times daily.  [name] ipratropium (ATROVENT) 0.03 % nasal spray Place 2 sprays into both nostrils 2 (two) times daily.   [name] ipratropium-albuterol (DUONEB) 0.5-2.5 (3) MG/3ML SOLN Take 3 mLs by nebulization 3 (three) times daily.  [name] levETIRAcetam (KEPPRA XR) 500 MG 24 hr tablet Take 1 tablet (500 mg total) by mouth daily.  [name] linagliptin (TRADJENTA) 5 MG TABS tablet Take 5 mg by mouth daily.  [name] LORazepam (ATIVAN) 2 MG/ML injection Inject 0.5 mLs (1 mg total) into the muscle daily as needed for seizure.  . meclizine (ANTIVERT) 25 MG tablet Take 25 mg by mouth every 12 (twelve) hours  as needed for dizziness.   . Multiple Vitamin (MULTIVITAMIN WITH MINERALS) TABS tablet Take 1 tablet by mouth daily.  . mupirocin ointment (BACTROBAN) 2 % Place 1 application into the nose 2 (two) times daily.  . nitroGLYCERIN (NITROSTAT) 0.4 MG SL tablet Place 0.4 mg under the tongue as needed.  . pantoprazole (PROTONIX) 40 MG tablet Take 1 tablet (40 mg total) by mouth daily.  . predniSONE (DELTASONE) 10 MG tablet Label  & dispense according to the schedule below.  4 Pills PO for 1 day, 3 Pills PO for 1 day, 2 Pills PO for 1 day, 1 Pill PO for 1 days then STOP.  [name] vitamin B-12 1000 MCG tablet Take 1 tablet (1,000 mcg total) by mouth daily.   No  facility-administered encounter medications on file as of 11/01/2018.     PHYSICAL EXAM:   General: NAD, obese, debilitated, pleasant, chronically ill female Cardiovascular: regular rate and rhythm Pulmonary: clear ant fields Abdomen: soft, nontender, + bowel sounds Extremities: + edema, no joint deformities Neurological: non-ambulatory   Z , NP

## 2018-11-12 DIAGNOSIS — Z79899 Other long term (current) drug therapy: Secondary | ICD-10-CM | POA: Diagnosis not present

## 2018-11-12 DIAGNOSIS — E7849 Other hyperlipidemia: Secondary | ICD-10-CM | POA: Diagnosis not present

## 2018-11-12 DIAGNOSIS — E119 Type 2 diabetes mellitus without complications: Secondary | ICD-10-CM | POA: Diagnosis not present

## 2018-11-13 DIAGNOSIS — J449 Chronic obstructive pulmonary disease, unspecified: Secondary | ICD-10-CM | POA: Diagnosis not present

## 2018-11-14 DIAGNOSIS — J449 Chronic obstructive pulmonary disease, unspecified: Secondary | ICD-10-CM | POA: Diagnosis not present

## 2018-11-14 DIAGNOSIS — I2510 Atherosclerotic heart disease of native coronary artery without angina pectoris: Secondary | ICD-10-CM | POA: Diagnosis not present

## 2018-11-14 DIAGNOSIS — U071 COVID-19: Secondary | ICD-10-CM | POA: Diagnosis not present

## 2018-11-21 ENCOUNTER — Other Ambulatory Visit: Payer: Self-pay

## 2018-11-21 ENCOUNTER — Emergency Department: Payer: Medicare HMO

## 2018-11-21 ENCOUNTER — Inpatient Hospital Stay
Admission: EM | Admit: 2018-11-21 | Discharge: 2018-11-26 | DRG: 176 | Disposition: A | Discharge status: Skilled Nursing Facility | Payer: Medicare HMO | Source: Skilled Nursing Facility | Attending: Internal Medicine | Admitting: Internal Medicine

## 2018-11-21 DIAGNOSIS — E785 Hyperlipidemia, unspecified: Secondary | ICD-10-CM | POA: Diagnosis present

## 2018-11-21 DIAGNOSIS — I130 Hypertensive heart and chronic kidney disease with heart failure and stage 1 through stage 4 chronic kidney disease, or unspecified chronic kidney disease: Secondary | ICD-10-CM | POA: Diagnosis not present

## 2018-11-21 DIAGNOSIS — N183 Chronic kidney disease, stage 3 unspecified: Secondary | ICD-10-CM | POA: Diagnosis present

## 2018-11-21 DIAGNOSIS — R6884 Jaw pain: Secondary | ICD-10-CM | POA: Diagnosis present

## 2018-11-21 DIAGNOSIS — I469 Cardiac arrest, cause unspecified: Secondary | ICD-10-CM | POA: Diagnosis not present

## 2018-11-21 DIAGNOSIS — I509 Heart failure, unspecified: Secondary | ICD-10-CM | POA: Diagnosis not present

## 2018-11-21 DIAGNOSIS — R0602 Shortness of breath: Secondary | ICD-10-CM | POA: Diagnosis not present

## 2018-11-21 DIAGNOSIS — K5909 Other constipation: Secondary | ICD-10-CM | POA: Diagnosis present

## 2018-11-21 DIAGNOSIS — Z8249 Family history of ischemic heart disease and other diseases of the circulatory system: Secondary | ICD-10-CM | POA: Diagnosis not present

## 2018-11-21 DIAGNOSIS — Z96651 Presence of right artificial knee joint: Secondary | ICD-10-CM | POA: Diagnosis present

## 2018-11-21 DIAGNOSIS — M2550 Pain in unspecified joint: Secondary | ICD-10-CM | POA: Diagnosis not present

## 2018-11-21 DIAGNOSIS — Z7401 Bed confinement status: Secondary | ICD-10-CM

## 2018-11-21 DIAGNOSIS — Z8673 Personal history of transient ischemic attack (TIA), and cerebral infarction without residual deficits: Secondary | ICD-10-CM | POA: Diagnosis not present

## 2018-11-21 DIAGNOSIS — I5032 Chronic diastolic (congestive) heart failure: Secondary | ICD-10-CM | POA: Diagnosis present

## 2018-11-21 DIAGNOSIS — Z6834 Body mass index (BMI) 34.0-34.9, adult: Secondary | ICD-10-CM

## 2018-11-21 DIAGNOSIS — F329 Major depressive disorder, single episode, unspecified: Secondary | ICD-10-CM | POA: Diagnosis present

## 2018-11-21 DIAGNOSIS — Z8619 Personal history of other infectious and parasitic diseases: Secondary | ICD-10-CM

## 2018-11-21 DIAGNOSIS — R609 Edema, unspecified: Secondary | ICD-10-CM

## 2018-11-21 DIAGNOSIS — R600 Localized edema: Secondary | ICD-10-CM | POA: Diagnosis not present

## 2018-11-21 DIAGNOSIS — J9611 Chronic respiratory failure with hypoxia: Secondary | ICD-10-CM | POA: Diagnosis not present

## 2018-11-21 DIAGNOSIS — E1122 Type 2 diabetes mellitus with diabetic chronic kidney disease: Secondary | ICD-10-CM | POA: Diagnosis not present

## 2018-11-21 DIAGNOSIS — R109 Unspecified abdominal pain: Secondary | ICD-10-CM | POA: Diagnosis not present

## 2018-11-21 DIAGNOSIS — N200 Calculus of kidney: Secondary | ICD-10-CM | POA: Diagnosis not present

## 2018-11-21 DIAGNOSIS — R079 Chest pain, unspecified: Secondary | ICD-10-CM | POA: Diagnosis not present

## 2018-11-21 DIAGNOSIS — R569 Unspecified convulsions: Secondary | ICD-10-CM | POA: Diagnosis present

## 2018-11-21 DIAGNOSIS — Z9981 Dependence on supplemental oxygen: Secondary | ICD-10-CM

## 2018-11-21 DIAGNOSIS — E1140 Type 2 diabetes mellitus with diabetic neuropathy, unspecified: Secondary | ICD-10-CM | POA: Diagnosis not present

## 2018-11-21 DIAGNOSIS — Z86711 Personal history of pulmonary embolism: Secondary | ICD-10-CM | POA: Diagnosis present

## 2018-11-21 DIAGNOSIS — Z7952 Long term (current) use of systemic steroids: Secondary | ICD-10-CM

## 2018-11-21 DIAGNOSIS — Z832 Family history of diseases of the blood and blood-forming organs and certain disorders involving the immune mechanism: Secondary | ICD-10-CM | POA: Diagnosis not present

## 2018-11-21 DIAGNOSIS — Z87891 Personal history of nicotine dependence: Secondary | ICD-10-CM | POA: Diagnosis not present

## 2018-11-21 DIAGNOSIS — R042 Hemoptysis: Secondary | ICD-10-CM

## 2018-11-21 DIAGNOSIS — E559 Vitamin D deficiency, unspecified: Secondary | ICD-10-CM | POA: Diagnosis present

## 2018-11-21 DIAGNOSIS — Z7902 Long term (current) use of antithrombotics/antiplatelets: Secondary | ICD-10-CM

## 2018-11-21 DIAGNOSIS — I2699 Other pulmonary embolism without acute cor pulmonale: Principal | ICD-10-CM | POA: Diagnosis present

## 2018-11-21 DIAGNOSIS — J449 Chronic obstructive pulmonary disease, unspecified: Secondary | ICD-10-CM | POA: Diagnosis present

## 2018-11-21 DIAGNOSIS — Z23 Encounter for immunization: Secondary | ICD-10-CM

## 2018-11-21 DIAGNOSIS — K219 Gastro-esophageal reflux disease without esophagitis: Secondary | ICD-10-CM | POA: Diagnosis present

## 2018-11-21 DIAGNOSIS — Z7984 Long term (current) use of oral hypoglycemic drugs: Secondary | ICD-10-CM

## 2018-11-21 DIAGNOSIS — Z79899 Other long term (current) drug therapy: Secondary | ICD-10-CM

## 2018-11-21 LAB — CBC WITH DIFFERENTIAL/PLATELET
Abs Immature Granulocytes: 0.02 10*3/uL (ref 0.00–0.07)
Basophils Absolute: 0 10*3/uL (ref 0.0–0.1)
Basophils Relative: 1 %
Eosinophils Absolute: 0.2 10*3/uL (ref 0.0–0.5)
Eosinophils Relative: 4 %
HCT: 32.5 % — ABNORMAL LOW (ref 36.0–46.0)
Hemoglobin: 10.4 g/dL — ABNORMAL LOW (ref 12.0–15.0)
Immature Granulocytes: 0 %
Lymphocytes Relative: 19 %
Lymphs Abs: 1 10*3/uL (ref 0.7–4.0)
MCH: 26.5 pg (ref 26.0–34.0)
MCHC: 32 g/dL (ref 30.0–36.0)
MCV: 82.9 fL (ref 80.0–100.0)
Monocytes Absolute: 0.4 10*3/uL (ref 0.1–1.0)
Monocytes Relative: 7 %
Neutro Abs: 3.8 10*3/uL (ref 1.7–7.7)
Neutrophils Relative %: 69 %
Platelets: 397 10*3/uL (ref 150–400)
RBC: 3.92 MIL/uL (ref 3.87–5.11)
RDW: 19.5 % — ABNORMAL HIGH (ref 11.5–15.5)
WBC: 5.4 10*3/uL (ref 4.0–10.5)
nRBC: 0 % (ref 0.0–0.2)

## 2018-11-21 LAB — PROTIME-INR
INR: 0.9 (ref 0.8–1.2)
Prothrombin Time: 12.4 seconds (ref 11.4–15.2)

## 2018-11-21 LAB — COMPREHENSIVE METABOLIC PANEL
ALT: 45 U/L — ABNORMAL HIGH (ref 0–44)
AST: 101 U/L — ABNORMAL HIGH (ref 15–41)
Albumin: 2.3 g/dL — ABNORMAL LOW (ref 3.5–5.0)
Alkaline Phosphatase: 233 U/L — ABNORMAL HIGH (ref 38–126)
Anion gap: 7 (ref 5–15)
BUN: 18 mg/dL (ref 8–23)
CO2: 27 mmol/L (ref 22–32)
Calcium: 9.5 mg/dL (ref 8.9–10.3)
Chloride: 101 mmol/L (ref 98–111)
Creatinine, Ser: 1.35 mg/dL — ABNORMAL HIGH (ref 0.44–1.00)
GFR calc Af Amer: 44 mL/min — ABNORMAL LOW (ref >60)
GFR calc non Af Amer: 38 mL/min — ABNORMAL LOW (ref >60)
Glucose, Bld: 89 mg/dL (ref 70–99)
Potassium: 4.3 mmol/L (ref 3.5–5.1)
Sodium: 135 mmol/L (ref 135–145)
Total Bilirubin: 0.6 mg/dL (ref 0.3–1.2)
Total Protein: 7.3 g/dL (ref 6.5–8.1)

## 2018-11-21 LAB — TROPONIN I (HIGH SENSITIVITY)
Troponin I (High Sensitivity): 6 ng/L (ref <18)
Troponin I (High Sensitivity): 4 ng/L (ref <18)

## 2018-11-21 LAB — SARS CORONAVIRUS 2 BY RT PCR (HOSPITAL ORDER, PERFORMED IN ~~LOC~~ HOSPITAL LAB): SARS Coronavirus 2: NEGATIVE

## 2018-11-21 LAB — TSH: TSH: 0.986 u[IU]/mL (ref 0.350–4.500)

## 2018-11-21 LAB — APTT: aPTT: 28 seconds (ref 24–36)

## 2018-11-21 LAB — GLUCOSE, CAPILLARY: Glucose-Capillary: 85 mg/dL (ref 70–99)

## 2018-11-21 MED ORDER — SODIUM CHLORIDE 0.9 % IV SOLN: 250.0000 mL | INTRAVENOUS | Status: DC | PRN

## 2018-11-21 MED ORDER — IPRATROPIUM-ALBUTEROL 0.5-2.5 (3) MG/3ML IN SOLN: 3.0000 mL | Freq: Three times a day (TID) | RESPIRATORY_TRACT | Status: DC

## 2018-11-21 MED ORDER — UMECLIDINIUM BROMIDE 62.5 MCG/INH IN AEPB
1.0000 | INHALATION_SPRAY | Freq: Every day | RESPIRATORY_TRACT | Status: DC
  Administered 2018-11-22 – 2018-11-26 (×5): 1 via RESPIRATORY_TRACT
  Filled 2018-11-21: qty 7

## 2018-11-21 MED ORDER — VITAMIN B-12 1000 MCG PO TABS
1000.0000 ug | ORAL_TABLET | Freq: Every day | ORAL | Status: DC
  Administered 2018-11-22 – 2018-11-26 (×5): 1000 ug via ORAL
  Filled 2018-11-21 (×5): qty 1

## 2018-11-21 MED ORDER — HEPARIN (PORCINE) 25000 UT/250ML-% IV SOLN
1400.0000 [IU]/h | INTRAVENOUS | Status: DC
  Administered 2018-11-21: 1400 [IU]/h via INTRAVENOUS
  Filled 2018-11-21: qty 250

## 2018-11-21 MED ORDER — CLOPIDOGREL BISULFATE 75 MG PO TABS
75.0000 mg | ORAL_TABLET | Freq: Every day | ORAL | Status: DC
  Administered 2018-11-21 – 2018-11-24 (×4): 75 mg via ORAL
  Filled 2018-11-21 (×4): qty 1

## 2018-11-21 MED ORDER — INSULIN ASPART 100 UNIT/ML ~~LOC~~ SOLN: 0.0000 [IU] | Freq: Every day | SUBCUTANEOUS | Status: DC

## 2018-11-21 MED ORDER — FLUOXETINE HCL 20 MG PO CAPS
40.0000 mg | ORAL_CAPSULE | Freq: Every day | ORAL | Status: DC
  Administered 2018-11-22 – 2018-11-26 (×5): 40 mg via ORAL
  Filled 2018-11-21 (×5): qty 2

## 2018-11-21 MED ORDER — ATORVASTATIN CALCIUM 20 MG PO TABS
80.0000 mg | ORAL_TABLET | Freq: Every evening | ORAL | Status: DC
  Administered 2018-11-21 – 2018-11-25 (×4): 80 mg via ORAL
  Filled 2018-11-21 (×4): qty 4

## 2018-11-21 MED ORDER — FLUTICASONE-UMECLIDIN-VILANT 100-62.5-25 MCG/INH IN AEPB: 1.0000 | INHALATION_SPRAY | Freq: Every day | RESPIRATORY_TRACT | Status: DC

## 2018-11-21 MED ORDER — PANTOPRAZOLE SODIUM 40 MG PO TBEC
40.0000 mg | DELAYED_RELEASE_TABLET | Freq: Every day | ORAL | Status: DC
  Administered 2018-11-21 – 2018-11-26 (×6): 40 mg via ORAL
  Filled 2018-11-21 (×6): qty 1

## 2018-11-21 MED ORDER — SODIUM CHLORIDE 0.9% FLUSH: 3.0000 mL | INTRAVENOUS | Status: DC | PRN

## 2018-11-21 MED ORDER — ACETAMINOPHEN 325 MG PO TABS
650.0000 mg | ORAL_TABLET | Freq: Three times a day (TID) | ORAL | Status: DC
  Administered 2018-11-21 – 2018-11-26 (×14): 650 mg via ORAL
  Filled 2018-11-21 (×14): qty 2

## 2018-11-21 MED ORDER — IPRATROPIUM BROMIDE 0.03 % NA SOLN
2.0000 | Freq: Two times a day (BID) | NASAL | Status: DC
  Administered 2018-11-22 – 2018-11-26 (×6): 2 via NASAL
  Filled 2018-11-21: qty 30

## 2018-11-21 MED ORDER — ONDANSETRON HCL 4 MG PO TABS: 4.0000 mg | ORAL_TABLET | Freq: Four times a day (QID) | ORAL | Status: DC | PRN

## 2018-11-21 MED ORDER — ALBUTEROL SULFATE (2.5 MG/3ML) 0.083% IN NEBU: 3.0000 mL | INHALATION_SOLUTION | Freq: Three times a day (TID) | RESPIRATORY_TRACT | Status: DC | PRN

## 2018-11-21 MED ORDER — ACETAMINOPHEN 650 MG RE SUPP: 650.0000 mg | Freq: Four times a day (QID) | RECTAL | Status: DC | PRN

## 2018-11-21 MED ORDER — SODIUM CHLORIDE 0.9% FLUSH
3.0000 mL | Freq: Two times a day (BID) | INTRAVENOUS | Status: DC
  Administered 2018-11-21 – 2018-11-26 (×10): 3 mL via INTRAVENOUS

## 2018-11-21 MED ORDER — VITAMIN D 25 MCG (1000 UNIT) PO TABS
1000.0000 [IU] | ORAL_TABLET | Freq: Every day | ORAL | Status: DC
  Administered 2018-11-22 – 2018-11-26 (×5): 1000 [IU] via ORAL
  Filled 2018-11-21 (×5): qty 1

## 2018-11-21 MED ORDER — ADULT MULTIVITAMIN W/MINERALS CH
1.0000 | ORAL_TABLET | Freq: Every day | ORAL | Status: DC
  Administered 2018-11-21 – 2018-11-26 (×6): 1 via ORAL
  Filled 2018-11-21 (×6): qty 1

## 2018-11-21 MED ORDER — HEPARIN BOLUS VIA INFUSION
4500.0000 [IU] | Freq: Once | INTRAVENOUS | Status: AC
  Administered 2018-11-21: 4500 [IU] via INTRAVENOUS
  Filled 2018-11-21: qty 4500

## 2018-11-21 MED ORDER — LEVETIRACETAM ER 500 MG PO TB24
500.0000 mg | ORAL_TABLET | Freq: Every day | ORAL | Status: DC
  Administered 2018-11-22 – 2018-11-26 (×5): 500 mg via ORAL
  Filled 2018-11-21 (×7): qty 1

## 2018-11-21 MED ORDER — SODIUM CHLORIDE 0.9 % IV BOLUS
500.0000 mL | Freq: Once | INTRAVENOUS | Status: AC
  Administered 2018-11-21: 16:00:00 500 mL via INTRAVENOUS

## 2018-11-21 MED ORDER — MECLIZINE HCL 25 MG PO TABS
25.0000 mg | ORAL_TABLET | Freq: Two times a day (BID) | ORAL | Status: DC | PRN
  Filled 2018-11-21: qty 1

## 2018-11-21 MED ORDER — ACETAMINOPHEN 325 MG PO TABS: 650.0000 mg | ORAL_TABLET | Freq: Four times a day (QID) | ORAL | Status: DC | PRN

## 2018-11-21 MED ORDER — DOCUSATE SODIUM 100 MG PO CAPS
100.0000 mg | ORAL_CAPSULE | Freq: Two times a day (BID) | ORAL | Status: DC
  Administered 2018-11-21: 100 mg via ORAL
  Filled 2018-11-21 (×6): qty 1

## 2018-11-21 MED ORDER — LORATADINE 10 MG PO TABS
10.0000 mg | ORAL_TABLET | Freq: Every day | ORAL | Status: DC
  Administered 2018-11-21 – 2018-11-26 (×6): 10 mg via ORAL
  Filled 2018-11-21 (×6): qty 1

## 2018-11-21 MED ORDER — DICLOFENAC SODIUM 1 % TD GEL
2.0000 g | Freq: Two times a day (BID) | TRANSDERMAL | Status: DC | PRN
  Administered 2018-11-24 – 2018-11-26 (×4): 2 g via TOPICAL
  Filled 2018-11-21 (×2): qty 100

## 2018-11-21 MED ORDER — LINAGLIPTIN 5 MG PO TABS
5.0000 mg | ORAL_TABLET | Freq: Every day | ORAL | Status: DC
  Administered 2018-11-22 – 2018-11-26 (×6): 5 mg via ORAL
  Filled 2018-11-21 (×5): qty 1

## 2018-11-21 MED ORDER — HYDRALAZINE HCL 25 MG PO TABS
25.0000 mg | ORAL_TABLET | Freq: Three times a day (TID) | ORAL | Status: DC
  Administered 2018-11-21 – 2018-11-26 (×8): 25 mg via ORAL
  Filled 2018-11-21 (×11): qty 1

## 2018-11-21 MED ORDER — IOHEXOL 350 MG/ML SOLN
60.0000 mL | Freq: Once | INTRAVENOUS | Status: AC | PRN
  Administered 2018-11-21: 60 mL via INTRAVENOUS

## 2018-11-21 MED ORDER — MOMETASONE FURO-FORMOTEROL FUM 100-5 MCG/ACT IN AERO
2.0000 | INHALATION_SPRAY | Freq: Two times a day (BID) | RESPIRATORY_TRACT | Status: DC
  Administered 2018-11-22 – 2018-11-26 (×9): 2 via RESPIRATORY_TRACT
  Filled 2018-11-21: qty 8.8

## 2018-11-21 MED ORDER — GABAPENTIN 300 MG PO CAPS
300.0000 mg | ORAL_CAPSULE | Freq: Every day | ORAL | Status: DC
  Administered 2018-11-21 – 2018-11-25 (×4): 300 mg via ORAL
  Filled 2018-11-21 (×4): qty 1

## 2018-11-21 MED ORDER — ONDANSETRON HCL 4 MG/2ML IJ SOLN
4.0000 mg | Freq: Four times a day (QID) | INTRAMUSCULAR | Status: DC | PRN
  Administered 2018-11-24: 4 mg via INTRAVENOUS
  Filled 2018-11-21: qty 2

## 2018-11-21 MED ORDER — NITROGLYCERIN 0.4 MG SL SUBL: 0.4000 mg | SUBLINGUAL_TABLET | SUBLINGUAL | Status: DC | PRN

## 2018-11-21 MED ORDER — INSULIN ASPART 100 UNIT/ML ~~LOC~~ SOLN
0.0000 [IU] | Freq: Three times a day (TID) | SUBCUTANEOUS | Status: DC
  Administered 2018-11-22: 1 [IU] via SUBCUTANEOUS
  Filled 2018-11-21: qty 1

## 2018-11-21 NOTE — H&P (Signed)
[facility] NAME: Katherine [name]    MR#:  [id number]  DATE OF BIRTH:  07-03-1940  DATE OF ADMISSION:  11/21/2018  PRIMARY CARE PHYSICIAN: Valerie Roys, DO   REQUESTING/REFERRING PHYSICIAN:  Carrie Mew, MD     CHIEF COMPLAINT:   Chief Complaint  Patient presents with  . Shortness of Breath  . Chest Pain    HISTORY OF PRESENT ILLNESS: Katherine [name]  is a 78 y.o. female with a known history of congestive heart failure, chronic kidney disease stage III, COPD, diabetes type 2, GERD, hypertension, morbid obesity, history of seizures, history of previous stroke who resides in a [facility] who is presenting with chest pain or shortness of breath.  The symptoms have been going on for the past few days.  Patient is on chronic oxygen at 2 L.  Patient came to this emergency room with these complaints and was noted to have acute pulmonary embolism.  Patient states that she hardly gets out of bed because they do not get her up. PAST MEDICAL HISTORY:   Past Medical History:  Diagnosis Date  . Angioedema   . Anxiety and depression   . CHF (congestive heart failure) ([place])   . Chronic constipation   . Chronic kidney disease    stage 3  . COPD (chronic obstructive pulmonary disease) ([place])   . Diabetes mellitus without complication ([place])   . Gallstones   . GERD (gastroesophageal reflux disease)   . Hypertension   . Left ventricular hypertrophy   . Osteoarthritis   . Prurigo nodularis   . Seizures ([place])   . Stroke ([place])   . Tobacco abuse   . Vitamin D deficiency disease     PAST SURGICAL HISTORY:  Past Surgical History:  Procedure Laterality Date  . CHOLECYSTECTOMY    . 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   Procedure: TOTAL KNEE ARTHROPLASTY;  Surgeon: Hessie Knows, MD;  Location: [facility] ORS;  Service: Orthopedics;  Laterality: Right;    SOCIAL HISTORY:  Social History    Tobacco Use  . Smoking status: Former Smoker    Packs/day: 0.50    Years: 60.00    Pack years: 30.00    Types: Cigarettes  . Smokeless tobacco: Never Used  . Tobacco comment: quite 21mo ago-03/26/18  Substance Use Topics  . Alcohol use: No    Alcohol/week: 0.0 standard drinks    Comment: rare    FAMILY HISTORY:  Family History  Problem Relation Age of Onset  . Alcohol abuse Mother   . Sickle cell anemia Daughter   . Hypertension Son   . Cancer Neg Hx   . COPD Neg Hx   . Diabetes Neg Hx   . Heart disease Neg Hx   . Stroke Neg Hx     DRUG ALLERGIES:  Allergies  Allergen Reactions  . Bee Venom Swelling  . Enalapril Maleate Swelling    REVIEW OF SYSTEMS:   CONSTITUTIONAL: No fever, fatigue or weakness.  EYES: No blurred or double vision.  EARS, NOSE, AND THROAT: No tinnitus or ear pain.  RESPIRATORY: No cough, positive shortness of breath, wheezing or hemoptysis.  CARDIOVASCULAR: Positive chest pain, orthopnea, edema.  GASTROINTESTINAL: No nausea, vomiting, diarrhea or abdominal pain.  GENITOURINARY: No dysuria, hematuria.  ENDOCRINE: No polyuria, nocturia,  HEMATOLOGY: No anemia, easy bruising or bleeding SKIN: No rash or lesion. MUSCULOSKELETAL: No joint pain or arthritis.  NEUROLOGIC: No tingling, numbness, weakness.  PSYCHIATRY: No anxiety or depression.   MEDICATIONS AT HOME:  Prior to Admission medications   Medication Sig Start Date End Date Taking? Authorizing Provider  acetaminophen (TYLENOL) 650 MG CR tablet Take 650 mg by mouth 3 (three) times daily.    Yes [provider]  albuterol (ACCUNEB) 0.63 MG/3ML nebulizer solution Take 3 mLs by nebulization every 8 (eight) hours as needed for wheezing.    Yes [provider]  atorvastatin (LIPITOR) 80 MG tablet Take 80 mg by mouth every evening.    Yes [provider]  cetirizine (ZYRTEC) 10 MG tablet Take 5 mg by mouth at bedtime.   Yes [provider]  cholecalciferol  (VITAMIN D) 1000 units tablet Take 1,000 Units by mouth daily.   Yes [provider]  clopidogrel (PLAVIX) 75 MG tablet Take 75 mg by mouth daily.   Yes [provider]  diclofenac sodium (VOLTAREN) 1 % GEL Apply 2 g topically every 12 (twelve) hours as needed (for left knee pain).    Yes [provider]  docusate sodium (COLACE) 100 MG capsule Take 100 mg by mouth 2 (two) times daily.   Yes [provider]  FLUoxetine (PROZAC) 40 MG capsule Take 40 mg by mouth daily.    Yes [provider]  Fluticasone-Umeclidin-Vilant 100-62.5-25 MCG/INH AEPB Inhale 1 puff into the lungs daily.   Yes [provider]  gabapentin (NEURONTIN) 300 MG capsule Take 300 mg by mouth at bedtime.   Yes [provider]  hydrALAZINE (APRESOLINE) 25 MG tablet Take 25 mg by mouth 3 (three) times daily. 06/19/18  Yes [provider]  ipratropium (ATROVENT) 0.03 % nasal spray Place 2 sprays into both nostrils 2 (two) times daily.    Yes [provider]  ipratropium-albuterol (DUONEB) 0.5-2.5 (3) MG/3ML SOLN Take 3 mLs by nebulization 3 (three) times daily. 07/17/18  Yes Gladstone Lighter, MD  levETIRAcetam (KEPPRA XR) 500 MG 24 hr tablet Take 1 tablet (500 mg total) by mouth daily. 11/19/17  Yes McCue, Janett Billow, NP  linagliptin (TRADJENTA) 5 MG TABS tablet Take 5 mg by mouth daily.   Yes [provider]  LORazepam (ATIVAN) 2 MG/ML injection Inject 0.5 mLs (1 mg total) into the muscle daily as needed for seizure. Patient taking differently: Inject 1 mg into the muscle See admin instructions. Inject 0.23ml (1mg ) into the muscle daily as needed for seizures - repeat after 10 minutes if seizure activity continues 06/13/18  Yes Wieting, Richard, MD  meclizine (ANTIVERT) 25 MG tablet Take 25 mg by mouth every 12 (twelve) hours as needed for dizziness.    Yes [provider]  Multiple Vitamin (MULTIVITAMIN WITH MINERALS) TABS tablet Take 1 tablet  by mouth daily.   Yes [provider]  nitroGLYCERIN (NITROSTAT) 0.4 MG SL tablet Place 0.4 mg under the tongue as needed. 08/07/18  Yes [provider]  pantoprazole (PROTONIX) 40 MG tablet Take 1 tablet (40 mg total) by mouth daily. 04/29/18  Yes Bettey Costa, MD  vitamin B-12 1000 MCG tablet Take 1 tablet (1,000 mcg total) by mouth daily. 04/30/18  Yes Mody, Ulice Bold, MD  colchicine 0.6 MG tablet Take 1 tablet (0.6 mg total) by mouth daily. Patient not taking: Reported on 09/11/2018 06/14/18   Loletha Grayer, MD  mupirocin ointment (BACTROBAN) 2 % Place 1 application into the nose 2 (two) times daily. Patient not taking: Reported on 11/21/2018 06/13/18   Loletha Grayer, MD  predniSONE (DELTASONE)  10 MG tablet Label  & dispense according to the schedule below.  4 Pills PO for 1 day, 3 Pills PO for 1 day, 2 Pills PO for 1 day, 1 Pill PO for 1 days then STOP. Patient not taking: Reported on 11/21/2018 09/09/18   Henreitta Leber, MD      PHYSICAL EXAMINATION:   VITAL SIGNS: Blood pressure 117/81, pulse (!) 101, temperature 98.5 F (36.9 C), temperature source Oral, resp. rate (!) 26, height 5\' 8"  (1.727 m), weight 104 kg, SpO2 99 %.  GENERAL:  78 y.o.-year-old patient lying in the bed with no acute distress.  EYES: Pupils equal, round, reactive to light and accommodation. No scleral icterus. Extraocular muscles intact.  HEENT: Head atraumatic, normocephalic. Oropharynx and nasopharynx clear.  NECK:  Supple, no jugular venous distention. No thyroid enlargement, no tenderness.  LUNGS: Normal breath sounds bilaterally, no wheezing, rales,rhonchi or crepitation. No use of accessory muscles of respiration.  CARDIOVASCULAR: S1, S2 normal. No murmurs, rubs, or gallops.  ABDOMEN: Soft, nontender, nondistended. Bowel sounds present. No organomegaly or mass.  EXTREMITIES: Positive pedal edema, cyanosis, or clubbing.  NEUROLOGIC: Cranial nerves II through XII are intact. Muscle strength 5/5 in  all extremities. Sensation intact. Gait not checked.  PSYCHIATRIC: The patient is alert and oriented x 3.  SKIN: No obvious rash, lesion, or ulcer.   LABORATORY PANEL:   CBC Recent Labs  Lab 11/21/18 1443  WBC 5.4  HGB 10.4*  HCT 32.5*  PLT 397  MCV 82.9  MCH 26.5  MCHC 32.0  RDW 19.5*  LYMPHSABS 1.0  MONOABS 0.4  EOSABS 0.2  BASOSABS 0.0   ------------------------------------------------------------------------------------------------------------------  Chemistries  Recent Labs  Lab 11/21/18 1443  NA 135  K 4.3  CL 101  CO2 27  GLUCOSE 89  BUN 18  CREATININE 1.35*  CALCIUM 9.5  AST 101*  ALT 45*  ALKPHOS 233*  BILITOT 0.6   ------------------------------------------------------------------------------------------------------------------ estimated creatinine clearance is 44 mL/min (A) (by C-G formula based on SCr of 1.35 mg/dL (H)). ------------------------------------------------------------------------------------------------------------------ No results for input(s): TSH, T4TOTAL, T3FREE, THYROIDAB in the last 72 hours.  Invalid input(s): FREET3   Coagulation profile No results for input(s): INR, PROTIME in the last 168 hours. ------------------------------------------------------------------------------------------------------------------- No results for input(s): DDIMER in the last 72 hours. -------------------------------------------------------------------------------------------------------------------  Cardiac Enzymes No results for input(s): CKMB, TROPONINI, MYOGLOBIN in the last 168 hours.  Invalid input(s): CK ------------------------------------------------------------------------------------------------------------------ Invalid input(s): POCBNP  ---------------------------------------------------------------------------------------------------------------  Urinalysis    Component Value Date/Time   COLORURINE YELLOW (A) 09/06/2018 1222    APPEARANCEUR CLOUDY (A) 09/06/2018 1222   APPEARANCEUR Clear 02/20/2013 1459   LABSPEC 1.014 09/06/2018 1222   LABSPEC 1.012 02/20/2013 1459   PHURINE 5.0 09/06/2018 1222   GLUCOSEU NEGATIVE 09/06/2018 1222   GLUCOSEU Negative 02/20/2013 1459   HGBUR NEGATIVE 09/06/2018 1222   BILIRUBINUR NEGATIVE 09/06/2018 1222   BILIRUBINUR Negative 02/20/2013 1459   KETONESUR NEGATIVE 09/06/2018 1222   PROTEINUR NEGATIVE 09/06/2018 1222   NITRITE NEGATIVE 09/06/2018 1222   LEUKOCYTESUR LARGE (A) 09/06/2018 1222   LEUKOCYTESUR Negative 02/20/2013 1459     RADIOLOGY: Dg Chest Portable 1 View  Result Date: 11/21/2018 CLINICAL DATA:  Short of breath, chest pain EXAM: PORTABLE CHEST 1 VIEW COMPARISON:  Radiograph 09/06/2010 FINDINGS: Stable enlarged cardiac silhouette. Low lung volumes. Mild venous congestion. No focal infiltrate. LEFT lung base poorly evaluated. IMPRESSION: 1. Cardiomegaly and mild venous congestion. 2. Low lung volumes. 3. No focal infiltrate. Electronically Signed   By: Helane Gunther.D.  On: 11/21/2018 15:34    EKG: Orders placed or performed during the hospital encounter of 11/21/18  . EKG 12-Lead  . EKG 12-Lead  . ED EKG  . ED EKG    IMPRESSION AND PLAN: Patient 78 year old with multiple medical problems being admitted with PE  1.  Acute pulmonary embolism due to sedentary lifestyle Continue heparin can change to Eliquis tomorrow We will obtain lower extremity Dopplers  2.  Diabetes type 2 continue her home regimen we will place on sliding scale insulin  3.  Chronic congestive heart failure currently compensated  4.  Hyperlipidemia continue Lipitor  5.  Chronic kidney disease stage III monitor renal function avoid nephrotoxic  6.  Hypoalbuminemia abnormal LFTs CT scan shows no pathology continue to monitor may consider holding Lipitor in the future  7.  Morbid obesity weight loss recommended  8.  COPD with without exacerbation use nebs as needed   9.   Miscellaneous heparin for DVT prophylaxis     All the records are reviewed and case discussed with ED provider. Management plans discussed with the patient, family and they are in agreement.  CODE STATUS: Code Status History    Date Active Date Inactive Code Status Order ID Comments User Context   09/06/2018 1448 09/09/2018 2059 Full Code [id number]  Lang Snow, NP ED   08/21/2018 1621 08/26/2018 1834 Full Code [id number]  Lang Snow, NP ED   07/15/2018 1819 07/17/2018 2214 Full Code [id number]  Gladstone Lighter, MD Inpatient   06/10/2018 2046 06/13/2018 1428 Full Code [id number]  Mayo, Pete Pelt, MD Inpatient   04/28/2018 1053 04/29/2018 2305 Full Code [id number]  Epifanio Lesches, MD ED   04/13/2018 2359 04/15/2018 2116 Full Code [id number]  Arta Silence, MD Inpatient   02/24/2018 2221 02/28/2018 1815 Full Code [id number]  Dustin Flock, MD Inpatient   12/25/2017 1653 12/26/2017 2152 Full Code [id number]  Salary, Avel Peace, MD Inpatient   11/29/2017 2119 12/05/2017 1841 Full Code [id number]  Salary, Avel Peace, MD Inpatient   11/22/2017 0408 11/26/2017 1804 Full Code [id number]  Arta Silence, MD Inpatient   11/10/2017 1840 11/13/2017 1653 Full Code [id number]  Sela Hua, MD Inpatient   10/24/2017 0247 10/29/2017 2301 Full Code [id number]  Lance Coon, MD Inpatient   10/04/2017 1005 10/10/2017 1813 Full Code [id number]  Marliss Coots, PA-C Inpatient   10/01/2017 0819 10/04/2017 0904 Full Code [id number]  Lolita Cram, RN Inpatient   09/19/2017 1306 09/22/2017 1622 Full Code [id number]  Gorden Harms, MD Inpatient   09/11/2017 2021 09/15/2017 1510 Full Code [id number]  Gorden Harms, MD Inpatient   08/27/2017 1543 08/29/2017 2159 Full Code [id number]  Hillary Bow, MD ED   06/28/2017 1414 06/30/2017 1748 Full Code [id number]  Gorden Harms, MD Inpatient   06/12/2017 0858 06/16/2017 1659 DNR [id number]  Demetrios Loll, MD Inpatient   05/26/2017 1732 05/30/2017 1446 DNR [id number]   Hillary Bow, MD ED   05/17/2017 1228 05/21/2017 1511 DNR [id number]  Asencion Gowda, NP Inpatient   04/21/2017 0117 04/22/2017 1750 DNR [id number]  Lance Coon, MD Inpatient   04/05/2017 1411 04/08/2017 2101 Full Code [id number]  Saundra Shelling, MD Inpatient   02/08/2016 1414 02/11/2016 1729 Full Code [id number]  Hessie Knows, MD Inpatient   11/27/2015 1645 11/29/2015 1628 Full Code [id number]  Idelle Crouch, MD Inpatient   02/10/2015 2122 02/12/2015 1809 Full Code [id number]  Lytle Butte, MD ED   09/16/2014 1220 09/19/2014 1736 Full Code [id number]  Myrtis Ser  Mamie Nick, MD Inpatient   Advance Care Planning Activity       TOTAL TIME TAKING CARE OF THIS PATIENT:55 minutes.    Dustin Flock M.D on 11/21/2018 at 7:05 PM  Between 7am to 6pm - Pager - [phone]  After 6pm go to [email or web address] - Proofreader  [employer] [facility]  [phone]  CC: Primary care physician; Valerie Roys, DO

## 2018-11-21 NOTE — ED Triage Notes (Signed)
Pt arrives via EMS with cc of shob and chest pain. Pt has hx of CAD and COPD.  Pt given 0.4mg  nitroglycerin at 13:00.   Pt on 2L O2 at baseline with O2 sat of 100%  EMS EKG showed a fib with narrow complex.

## 2018-11-21 NOTE — ED Notes (Signed)
Spoke with [facility] who states patient tested positive 2 weeks ago. Report she was taken off of isolation on Monday 10/5, but no new COVID was done.

## 2018-11-21 NOTE — Consult Note (Signed)
ANTICOAGULATION CONSULT NOTE - Initial Consult  Pharmacy Consult for Heparin infusion  Indication: pulmonary embolus  Allergies  Allergen Reactions  . Bee Venom Swelling  . Enalapril Maleate Swelling    Patient Measurements: Height: 5\' 8"  (172.7 cm) Weight: 229 lb 4.5 oz (104 kg) IBW/kg (Calculated) : 63.9 Heparin Dosing Weight: 87 Kg  Vital Signs: Temp: 98.5 F (36.9 C) (10/08 1433) Temp Source: Oral (10/08 1433) BP: 117/81 (10/08 1830) Pulse Rate: 101 (10/08 1830)  Labs: Recent Labs    11/21/18 1443 11/21/18 1702  HGB 10.4*  --   HCT 32.5*  --   PLT 397  --   CREATININE 1.35*  --   TROPONINIHS 6 4    Estimated Creatinine Clearance: 44 mL/min (A) (by C-G formula based on SCr of 1.35 mg/dL (H)).   Medical History: Past Medical History:  Diagnosis Date  . Angioedema   . Anxiety and depression   . CHF (congestive heart failure) ([place])   . Chronic constipation   . Chronic kidney disease    stage 3  . COPD (chronic obstructive pulmonary disease) ([place])   . Diabetes mellitus without complication ([place])   . Gallstones   . GERD (gastroesophageal reflux disease)   . Hypertension   . Left ventricular hypertrophy   . Osteoarthritis   . Prurigo nodularis   . Seizures ([place])   . Stroke ([place])   . Tobacco abuse   . Vitamin D deficiency disease     Assessment: Pharmacy consulted for heparin infusion dosing and  Monitoring for 78 yo female admitted with PE. CT Chest shows two small left lower lobe pulmonary emboli. No anticoagulants PTA.   Goal of Therapy:  Heparin level 0.3-0.7 units/ml Monitor platelets by anticoagulation protocol: Yes   Plan:  Give 4500 units bolus x 1 Start heparin infusion at 1400 units/hr Check anti-Xa level in 8 hours and daily while on heparin Continue to monitor H&H and platelets  Pernell Dupre, PharmD, BCPS Clinical Pharmacist 11/21/2018 7:19 PM

## 2018-11-21 NOTE — ED Notes (Addendum)
ED TO INPATIENT HANDOFF REPORT  ED Nurse Name and Phone #: Karena Addison [phone]  S Name/Age/Gender Katherine [name] 78 y.o. female Room/Bed: ED15A/ED15A  Code Status   Code Status: Full Code  Home/SNF/Other Nursing Home Patient oriented to: self, place, time and situation Is this baseline? Yes   Triage Complete: Triage complete  Chief Complaint cp  Triage Note Pt arrives via EMS with cc of shob and chest pain. Pt has hx of CAD and COPD.  Pt given 0.4mg  nitroglycerin at 13:00.   Pt on 2L O2 at baseline with O2 sat of 100%  EMS EKG showed a fib with narrow complex.   Allergies Allergies  Allergen Reactions  . Bee Venom Swelling  . Enalapril Maleate Swelling    Level of Care/Admitting Diagnosis ED Disposition    ED Disposition Condition [facility] Area: [place] [[id number]]  Level of Care: Med-Surg [16]  Covid Evaluation: Asymptomatic Screening Protocol (No Symptoms)  Diagnosis: Pulmonary embolism [facility]) [id number]  Admitting Physician: Dustin Flock [id number]  Attending Physician: Dustin Flock [phone]  Estimated length of stay: past midnight tomorrow  Certification:: I certify this patient will need inpatient services for at least 2 midnights  PT Class (Do Not Modify): Inpatient [101]  PT Acc Code (Do Not Modify): Private [1]       B Medical/Surgery History Past Medical History:  Diagnosis Date  . Angioedema   . Anxiety and depression   . CHF (congestive heart failure) ([place])   . Chronic constipation   . Chronic kidney disease    stage 3  . COPD (chronic obstructive pulmonary disease) ([place])   . Diabetes mellitus without complication ([place])   . Gallstones   . GERD (gastroesophageal reflux disease)   . Hypertension   . Left ventricular hypertrophy   . Osteoarthritis   . Prurigo nodularis   . Seizures ([place])   . Stroke ([place])   . Tobacco abuse   . Vitamin D deficiency disease    Past Surgical History:  Procedure Laterality  Date  . CHOLECYSTECTOMY    . 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   Procedure: TOTAL KNEE ARTHROPLASTY;  Surgeon: Hessie Knows, MD;  Location: [facility] ORS;  Service: Orthopedics;  Laterality: Right;     A IV Location/Drains/Wounds Patient Lines/Drains/Airways Status   Active Line/Drains/Airways    Name:   Placement date:   Placement time:   Site:   Days:   Peripheral IV 11/21/18 Left Forearm   11/21/18    1522    Forearm   less than 1   Peripheral IV 11/21/18 Right;Posterior Wrist   11/21/18    2031    Wrist   less than 1   External Urinary Catheter   09/06/18    1728    -   77          Intake/Output Last 24 hours  Intake/Output Summary (Last 24 hours) at 11/21/2018 2108 Last data filed at 11/21/2018 1710 Gross per 24 hour  Intake 500 ml  Output -  Net 500 ml    Labs/Imaging Results for orders placed or performed during the hospital encounter of 11/21/18 (from the past 48 hour(s))  Comprehensive metabolic panel     Status: Abnormal   Collection Time: 11/21/18  2:43 PM  Result Value Ref Range   Sodium 135 135 - 145 mmol/L   Potassium 4.3 3.5 - 5.1 mmol/L   Chloride 101 98 -  111 mmol/L   CO2 27 22 - 32 mmol/L   Glucose, Bld 89 70 - 99 mg/dL   BUN 18 8 - 23 mg/dL   Creatinine, Ser 1.35 (H) 0.44 - 1.00 mg/dL   Calcium 9.5 8.9 - 10.3 mg/dL   Total Protein 7.3 6.5 - 8.1 g/dL   Albumin 2.3 (L) 3.5 - 5.0 g/dL   AST 101 (H) 15 - 41 U/L   ALT 45 (H) 0 - 44 U/L   Alkaline Phosphatase 233 (H) 38 - 126 U/L   Total Bilirubin 0.6 0.3 - 1.2 mg/dL   GFR calc non Af Amer 38 (L) >60 mL/min   GFR calc Af Amer 44 (L) >60 mL/min   Anion gap 7 5 - 15    Comment: Performed at [facility], [place], [place] [address]  Troponin I (High Sensitivity)     Status: None   Collection Time: 11/21/18  2:43 PM  Result Value Ref Range   Troponin I (High Sensitivity) 6 <18 ng/L    Comment: (NOTE) Elevated high sensitivity troponin I  (hsTnI) values and significant  changes across serial measurements may suggest ACS but many other  chronic and acute conditions are known to elevate hsTnI results.  Refer to the "Links" section for chest pain algorithms and additional  guidance. Performed at [facility], [address] [place], [place] [address]   CBC with Differential     Status: Abnormal   Collection Time: 11/21/18  2:43 PM  Result Value Ref Range   WBC 5.4 4.0 - 10.5 K/uL   RBC 3.92 3.87 - 5.11 MIL/uL   Hemoglobin 10.4 (L) 12.0 - 15.0 g/dL   HCT 32.5 (L) 36.0 - 46.0 %   MCV 82.9 80.0 - 100.0 fL   MCH 26.5 26.0 - 34.0 pg   MCHC 32.0 30.0 - 36.0 g/dL   RDW 19.5 (H) 11.5 - 15.5 %   Platelets 397 150 - 400 K/uL   nRBC 0.0 0.0 - 0.2 %   Neutrophils Relative % 69 %   Neutro Abs 3.8 1.7 - 7.7 K/uL   Lymphocytes Relative 19 %   Lymphs Abs 1.0 0.7 - 4.0 K/uL   Monocytes Relative 7 %   Monocytes Absolute 0.4 0.1 - 1.0 K/uL   Eosinophils Relative 4 %   Eosinophils Absolute 0.2 0.0 - 0.5 K/uL   Basophils Relative 1 %   Basophils Absolute 0.0 0.0 - 0.1 K/uL   Immature Granulocytes 0 %   Abs Immature Granulocytes 0.02 0.00 - 0.07 K/uL    Comment: Performed at [facility], [address] [place], [place] [address]  SARS Coronavirus 2 by RT PCR (hospital order, performed in [place] hospital lab) Nasopharyngeal Nasopharyngeal Swab     Status: None   Collection Time: 11/21/18  3:35 PM   Specimen: Nasopharyngeal Swab  Result Value Ref Range   SARS Coronavirus 2 NEGATIVE NEGATIVE    Comment: (NOTE) If result is NEGATIVE SARS-CoV-2 target nucleic acids are NOT DETECTED. The SARS-CoV-2 RNA is generally detectable in upper and lower  respiratory specimens during the acute phase of infection. The lowest  concentration of SARS-CoV-2 viral copies this assay can detect is 250  copies / mL. A negative result does not preclude SARS-CoV-2 infection  and should not be used as the sole basis for treatment  or other  patient management decisions.  A negative result may occur with  improper specimen collection / handling, submission of  specimen other  than nasopharyngeal swab, presence of viral mutation(s) within the  areas targeted by this assay, and inadequate number of viral copies  (<250 copies / mL). A negative result must be combined with clinical  observations, patient history, and epidemiological information. If result is POSITIVE SARS-CoV-2 target nucleic acids are DETECTED. The SARS-CoV-2 RNA is generally detectable in upper and lower  respiratory specimens dur ing the acute phase of infection.  Positive  results are indicative of active infection with SARS-CoV-2.  Clinical  correlation with patient history and other diagnostic information is  necessary to determine patient infection status.  Positive results do  not rule out bacterial infection or co-infection with other viruses. If result is PRESUMPTIVE POSTIVE SARS-CoV-2 nucleic acids MAY BE PRESENT.   A presumptive positive result was obtained on the submitted specimen  and confirmed on repeat testing.  While 2019 novel coronavirus  (SARS-CoV-2) nucleic acids may be present in the submitted sample  additional confirmatory testing may be necessary for epidemiological  and / or clinical management purposes  to differentiate between  SARS-CoV-2 and other Sarbecovirus currently known to infect humans.  If clinically indicated additional testing with an alternate test  methodology [phone]) is advised. The SARS-CoV-2 RNA is generally  detectable in upper and lower respiratory sp ecimens during the acute  phase of infection. The expected result is Negative. Fact Sheet for Patients:  StrictlyIdeas.no Fact Sheet for Healthcare Providers: BankingDealers.co.za This test is not yet approved or cleared by the [place] FDA and has been authorized for detection and/or diagnosis of  SARS-CoV-2 by FDA under an Emergency Use Authorization (EUA).  This EUA will remain in effect (meaning this test can be used) for the duration of the COVID-19 declaration under Section 564(b)(1) of the Act, 21 U.S.C. section 360bbb-3(b)(1), unless the authorization is terminated or revoked sooner. Performed at [facility], [place], [place] [address]   Troponin I (High Sensitivity)     Status: None   Collection Time: 11/21/18  5:02 PM  Result Value Ref Range   Troponin I (High Sensitivity) 4 <18 ng/L    Comment: (NOTE) Elevated high sensitivity troponin I (hsTnI) values and significant  changes across serial measurements may suggest ACS but many other  chronic and acute conditions are known to elevate hsTnI results.  Refer to the "Links" section for chest pain algorithms and additional  guidance. Performed at [facility], [address] [place], [place] [address]   APTT     Status: None   Collection Time: 11/21/18  8:28 PM  Result Value Ref Range   aPTT 28 24 - 36 seconds    Comment: Performed at [facility], [address] [place],  [address]  Protime-INR     Status: None   Collection Time: 11/21/18  8:28 PM  Result Value Ref Range   Prothrombin Time 12.4 11.4 - 15.2 seconds   INR 0.9 0.8 - 1.2    Comment: (NOTE) INR goal varies based on device and disease states. Performed at [facility], [place],  [address]    Ct Angio Chest Pe W And/or Wo Contrast  Result Date: 11/21/2018 CLINICAL DATA:  Chest pain, shortness of breath and diffuse, acute abdominal pain. EXAM: CT ANGIOGRAPHY CHEST CT ABDOMEN AND PELVIS WITH CONTRAST TECHNIQUE: Multidetector CT imaging of the chest was performed using the standard protocol during bolus administration of intravenous contrast. Multiplanar CT image reconstructions and MIPs were obtained to  evaluate the vascular anatomy. Multidetector CT imaging of the  abdomen and pelvis was performed using the standard protocol during bolus administration of intravenous contrast. CONTRAST:  69mL OMNIPAQUE IOHEXOL 350 MG/ML SOLN COMPARISON:  Portable chest obtained earlier today. Chest CT dated 07/15/2018. Abdomen and pelvis CT dated 05/04/2017. FINDINGS: CTA CHEST FINDINGS Cardiovascular: Enlarged central pulmonary arteries. The main pulmonary artery measures 3.6 cm in diameter. The pulmonary arteries are normally opacified with a very small filling defect noted in a left lower lobe pulmonary artery branch on image number 136 series 3, 86 series 7 and 112 series 8. There is a smaller filling defect more inferiorly in a branch of that pulmonary artery on image number 151 series 3. No other pulmonary arterial filling defects are seen. No evidence of right ventricular strain. Minimal pericardial effusion with a maximum thickness of 15 mm anteriorly and inferiorly. Dense atheromatous calcifications, including extensive coronary artery calcifications. Mediastinum/Nodes: No enlarged mediastinal, hilar, or axillary lymph nodes. Thyroid gland, trachea, and esophagus demonstrate no significant findings. Lungs/Pleura: Mild bibasilar subsegmental atelectasis. Minimal bilateral pleural fluid. Musculoskeletal: Thoracic and lower cervical spine degenerative changes. Review of the MIP images confirms the above findings. CT ABDOMEN and PELVIS FINDINGS Hepatobiliary: No focal liver abnormality is seen. Status post cholecystectomy. No biliary dilatation. Pancreas: Unremarkable. No pancreatic ductal dilatation or surrounding inflammatory changes. Spleen: Normal in size without focal abnormality. Adrenals/Urinary Tract: The previously demonstrated 1.8 cm left adrenal mass containing a coarse curvilinear calcification currently measures 1.6 x 1.3 cm on image number 23 series 4. Stable normal appearing right adrenal gland period. Stable small left renal cyst. The left kidney remains somewhat small.  Both kidneys have small fetal lobulations. Tiny calculus in the inferior right kidney. There are also 2 tiny right renal cysts. Unremarkable ureters and urinary bladder. Stomach/Bowel: Stomach is within normal limits. Appendix appears normal. No evidence of bowel wall thickening, distention, or inflammatory changes. Vascular/Lymphatic: Extensive atheromatous arterial calcifications. The previously demonstrated 4.3 cm infrarenal abdominal aortic aneurysm currently measures 4.5 cm in maximum diameter on image number 39 series 4. This is not involving the iliac arteries. Reproductive: Uterus and bilateral adnexa are unremarkable. Other: None. Musculoskeletal: Lower thoracic spine degenerative changes and mild lumbar spine degenerative changes. Review of the MIP images confirms the above findings. IMPRESSION: 1. Two small left lower lobe pulmonary emboli. 2. Mild bibasilar subsegmental atelectasis. 3. Minimal bilateral pleural fluid. 4. Minimal pericardial effusion. 5. Extensive calcified coronary artery atherosclerosis and aortic atherosclerosis. 6. Interval mild increase in size of the previously demonstrated 4.3 cm infrarenal abdominal aortic aneurysm, currently measuring 4.5 cm in maximum diameter. 7. Tiny, nonobstructing right renal calculus. Critical Value/emergent results were called by telephone at the time of interpretation on 11/21/2018 at 6:58 pm to providerPHILLIP STAFFORD , who verbally acknowledged these results. Electronically Signed   By: Claudie Revering M.D.   On: 11/21/2018 19:05   Ct Abdomen Pelvis W Contrast  Result Date: 11/21/2018 CLINICAL DATA:  Chest pain, shortness of breath and diffuse, acute abdominal pain. EXAM: CT ANGIOGRAPHY CHEST CT ABDOMEN AND PELVIS WITH CONTRAST TECHNIQUE: Multidetector CT imaging of the chest was performed using the standard protocol during bolus administration of intravenous contrast. Multiplanar CT image reconstructions and MIPs were obtained to evaluate the vascular  anatomy. Multidetector CT imaging of the abdomen and pelvis was performed using the standard protocol during bolus administration of intravenous contrast. CONTRAST:  63mL OMNIPAQUE IOHEXOL 350 MG/ML SOLN COMPARISON:  Portable chest obtained earlier today. Chest CT dated 07/15/2018. Abdomen  and pelvis CT dated 05/04/2017. FINDINGS: CTA CHEST FINDINGS Cardiovascular: Enlarged central pulmonary arteries. The main pulmonary artery measures 3.6 cm in diameter. The pulmonary arteries are normally opacified with a very small filling defect noted in a left lower lobe pulmonary artery branch on image number 136 series 3, 86 series 7 and 112 series 8. There is a smaller filling defect more inferiorly in a branch of that pulmonary artery on image number 151 series 3. No other pulmonary arterial filling defects are seen. No evidence of right ventricular strain. Minimal pericardial effusion with a maximum thickness of 15 mm anteriorly and inferiorly. Dense atheromatous calcifications, including extensive coronary artery calcifications. Mediastinum/Nodes: No enlarged mediastinal, hilar, or axillary lymph nodes. Thyroid gland, trachea, and esophagus demonstrate no significant findings. Lungs/Pleura: Mild bibasilar subsegmental atelectasis. Minimal bilateral pleural fluid. Musculoskeletal: Thoracic and lower cervical spine degenerative changes. Review of the MIP images confirms the above findings. CT ABDOMEN and PELVIS FINDINGS Hepatobiliary: No focal liver abnormality is seen. Status post cholecystectomy. No biliary dilatation. Pancreas: Unremarkable. No pancreatic ductal dilatation or surrounding inflammatory changes. Spleen: Normal in size without focal abnormality. Adrenals/Urinary Tract: The previously demonstrated 1.8 cm left adrenal mass containing a coarse curvilinear calcification currently measures 1.6 x 1.3 cm on image number 23 series 4. Stable normal appearing right adrenal gland period. Stable small left renal cyst.  The left kidney remains somewhat small. Both kidneys have small fetal lobulations. Tiny calculus in the inferior right kidney. There are also 2 tiny right renal cysts. Unremarkable ureters and urinary bladder. Stomach/Bowel: Stomach is within normal limits. Appendix appears normal. No evidence of bowel wall thickening, distention, or inflammatory changes. Vascular/Lymphatic: Extensive atheromatous arterial calcifications. The previously demonstrated 4.3 cm infrarenal abdominal aortic aneurysm currently measures 4.5 cm in maximum diameter on image number 39 series 4. This is not involving the iliac arteries. Reproductive: Uterus and bilateral adnexa are unremarkable. Other: None. Musculoskeletal: Lower thoracic spine degenerative changes and mild lumbar spine degenerative changes. Review of the MIP images confirms the above findings. IMPRESSION: 1. Two small left lower lobe pulmonary emboli. 2. Mild bibasilar subsegmental atelectasis. 3. Minimal bilateral pleural fluid. 4. Minimal pericardial effusion. 5. Extensive calcified coronary artery atherosclerosis and aortic atherosclerosis. 6. Interval mild increase in size of the previously demonstrated 4.3 cm infrarenal abdominal aortic aneurysm, currently measuring 4.5 cm in maximum diameter. 7. Tiny, nonobstructing right renal calculus. Critical Value/emergent results were called by telephone at the time of interpretation on 11/21/2018 at 6:58 pm to providerPHILLIP STAFFORD , who verbally acknowledged these results. Electronically Signed   By: Claudie Revering M.D.   On: 11/21/2018 19:05   Dg Chest Portable 1 View  Result Date: 11/21/2018 CLINICAL DATA:  Short of breath, chest pain EXAM: PORTABLE CHEST 1 VIEW COMPARISON:  Radiograph 09/06/2010 FINDINGS: Stable enlarged cardiac silhouette. Low lung volumes. Mild venous congestion. No focal infiltrate. LEFT lung base poorly evaluated. IMPRESSION: 1. Cardiomegaly and mild venous congestion. 2. Low lung volumes. 3. No focal  infiltrate. Electronically Signed   By: Suzy Bouchard M.D.   On: 11/21/2018 15:34    Pending Labs Unresulted Labs (From admission, onward)    Start     Ordered   11/22/18 0500  CBC  Tomorrow morning,   STAT     11/21/18 2047   11/22/18 [id number]  Basic metabolic panel  Tomorrow morning,   STAT     11/21/18 2047   11/22/18 0500  Heparin level (unfractionated)  Tomorrow morning,   STAT  11/21/18 2102   11/21/18 2048  TSH  Once,   STAT     11/21/18 2047          Vitals/Pain Today's Vitals   11/21/18 1700 11/21/18 1730 11/21/18 1800 11/21/18 1830  BP: (!) 112/96 126/77 115/72 117/81  Pulse:  98 (!) 101 (!) 101  Resp: (!) 22 20 (!) 25 (!) 26  Temp:      TempSrc:      SpO2:  100% 98% 99%  Weight:      Height:      PainSc:        Isolation Precautions No active isolations  Medications Medications  heparin bolus via infusion 4,500 Units (4,500 Units Intravenous Bolus from Bag 11/21/18 2100)    Followed by  heparin ADULT infusion 100 units/mL (25000 units/244mL sodium chloride 0.45%) (1,400 Units/hr Intravenous New Bag/Given 11/21/18 2100)  acetaminophen (TYLENOL) CR tablet 650 mg (has no administration in time range)  atorvastatin (LIPITOR) tablet 80 mg (has no administration in time range)  hydrALAZINE (APRESOLINE) tablet 25 mg (has no administration in time range)  nitroGLYCERIN (NITROSTAT) SL tablet 0.4 mg (has no administration in time range)  FLUoxetine (PROZAC) capsule 40 mg (has no administration in time range)  linagliptin (TRADJENTA) tablet 5 mg (has no administration in time range)  docusate sodium (COLACE) capsule 100 mg (has no administration in time range)  meclizine (ANTIVERT) tablet 25 mg (has no administration in time range)  pantoprazole (PROTONIX) EC tablet 40 mg (has no administration in time range)  clopidogrel (PLAVIX) tablet 75 mg (has no administration in time range)  vitamin B-12 (CYANOCOBALAMIN) tablet 1,000 mcg (has no administration in time range)   gabapentin (NEURONTIN) capsule 300 mg (has no administration in time range)  levETIRAcetam (KEPPRA XR) 24 hr tablet 500 mg (has no administration in time range)  cholecalciferol (VITAMIN D) tablet 1,000 Units (has no administration in time range)  multivitamin with minerals tablet 1 tablet (has no administration in time range)  albuterol (ACCUNEB) nebulizer solution 3 mL (has no administration in time range)  loratadine (CLARITIN) tablet 10 mg (has no administration in time range)  Fluticasone-Umeclidin-Vilant 100-62.5-25 MCG/INH AEPB 1 puff (has no administration in time range)  ipratropium (ATROVENT) 0.03 % nasal spray 2 spray (has no administration in time range)  ipratropium-albuterol (DUONEB) 0.5-2.5 (3) MG/3ML nebulizer solution 3 mL (has no administration in time range)  diclofenac sodium (VOLTAREN) 1 % transdermal gel 2 g (has no administration in time range)  sodium chloride flush (NS) 0.9 % injection 3 mL (has no administration in time range)  sodium chloride flush (NS) 0.9 % injection 3 mL (has no administration in time range)  0.9 %  sodium chloride infusion (has no administration in time range)  acetaminophen (TYLENOL) tablet 650 mg (has no administration in time range)    Or  acetaminophen (TYLENOL) suppository 650 mg (has no administration in time range)  ondansetron (ZOFRAN) tablet 4 mg (has no administration in time range)    Or  ondansetron (ZOFRAN) injection 4 mg (has no administration in time range)  insulin aspart (novoLOG) injection 0-9 Units (has no administration in time range)  insulin aspart (novoLOG) injection 0-5 Units (has no administration in time range)  sodium chloride 0.9 % bolus 500 mL (0 mLs Intravenous Stopped 11/21/18 1710)  iohexol (OMNIPAQUE) 350 MG/ML injection 60 mL (60 mLs Intravenous Contrast Given 11/21/18 1740)    Mobility non-ambulatory Low fall risk   Focused Assessments    R Recommendations: See  Admitting Provider Note  Report given  to: Jeanett Schlein, RN

## 2018-11-21 NOTE — ED Provider Notes (Signed)
[facility] Emergency Department Provider Note  ____________________________________________  Time seen: Approximately 4:23 PM  I have reviewed the triage vital signs and the nursing notes.   HISTORY  Chief Complaint Shortness of Breath and Chest Pain  Level 5 Caveat: Portions of the History and Physical including HPI and review of systems are unable to be completely obtained due to patient being a poor historian    HPI Katherine [name] is a 78 y.o. female with a history of angioedema, CHF, CKD, COPD, diabetes, GERD, hypertension, stroke sent to the ED due to complaining of left-sided chest pain and shortness of breath, ongoing for the past few days associated malaise, decreased appetite, decreased oral intake.  Patient is on 2 L nasal cannula chronically.  Pain is nonradiating.    No alleviating factors, hurts to breathe.  Was recently diagnosed with COVID-19 2 weeks ago, was taken off of isolation at her nursing home 3 days ago.  Has chronic right arm weakness contracture and edema due to prior stroke     Past Medical History:  Diagnosis Date  . Angioedema   . Anxiety and depression   . CHF (congestive heart failure) ([place])   . Chronic constipation   . Chronic kidney disease    stage 3  . COPD (chronic obstructive pulmonary disease) ([place])   . Diabetes mellitus without complication ([place])   . Gallstones   . GERD (gastroesophageal reflux disease)   . Hypertension   . Left ventricular hypertrophy   . Osteoarthritis   . Prurigo nodularis   . Seizures ([place])   . Stroke ([place])   . Tobacco abuse   . Vitamin D deficiency disease      Patient Active Problem List   Diagnosis Date Noted  . Delirium   . UTI (urinary tract infection) 08/21/2018  . Chest pain 07/15/2018  . Palliative care encounter 05/10/2018  . Localized edema 05/10/2018  . Shortness of breath 05/10/2018  . Hematemesis 04/28/2018  . Influenza A 04/13/2018  . Acute on chronic congestive  heart failure ([place])   . COPD with acute exacerbation ([place]) 02/24/2018  . Chronic diastolic heart failure ([place]) 12/31/2017  . Lymphedema 12/31/2017  . COPD exacerbation ([place]) 11/30/2017  . Urinary tract infection 11/22/2017  . 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[place]) 11/16/2017  . Diabetes mellitus type 2, uncomplicated ([place]) 0000000  . Pressure injury of skin 10/29/2017  . Acute kidney injury superimposed on CKD ([place]) 10/24/2017  . Severe sepsis ([place]) 10/24/2017  . Possible Seizures ([place]) 10/08/2017  . Hyperlipemia 10/08/2017  . 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 Stroke-like episode ([place]) s/p IV tpa 10/04/2017  . Palliative care by specialist   . Elevated rheumatoid factor 09/05/2017  . Frequent hospital admissions 08/22/2017  . Aphasia 06/28/2017  . Hand pain 03/21/2017  . Dry skin 03/21/2017  . Pain in finger of left hand 09/12/2016  . Chronic fatigue 06/12/2016  . Left knee pain 06/12/2016  . Goals of care, counseling/discussion 03/13/2016  . Primary localized osteoarthritis of right knee 02/08/2016  . OSA (obstructive sleep apnea) 09/16/2015  . Rotator cuff syndrome 09/07/2015  . 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 Sleep disturbance 04/14/2015  . Coronary artery disease 03/14/2015  . 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 Acute respiratory failure with hypoxia ([place]) 09/16/2014  . 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 Anxiety   . Tobacco abuse   . Prurigo nodularis   . GERD (gastroesophageal reflux disease)   . COPD (chronic obstructive  pulmonary disease) ([place])   . Osteoarthritis   . Vitamin D deficiency disease   . Chronic constipation   . CKD (chronic kidney disease), stage III   . Essential hypertension 05/20/2013  . Morbid obesity ([place]) 05/20/2013     Past Surgical History:  Procedure Laterality Date  . CHOLECYSTECTOMY    . 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   Procedure: TOTAL KNEE  ARTHROPLASTY;  Surgeon: Hessie Knows, MD;  Location: [facility] ORS;  Service: Orthopedics;  Laterality: Right;     Prior to Admission medications   Medication Sig Start Date End Date Taking? Authorizing Provider  acetaminophen (TYLENOL) 650 MG CR tablet Take 650 mg by mouth 3 (three) times daily.    Yes [provider]  albuterol (ACCUNEB) 0.63 MG/3ML nebulizer solution Take 3 mLs by nebulization every 8 (eight) hours as needed for wheezing.    Yes [provider]  atorvastatin (LIPITOR) 80 MG tablet Take 80 mg by mouth every evening.    Yes [provider]  cetirizine (ZYRTEC) 10 MG tablet Take 5 mg by mouth at bedtime.   Yes [provider]  cholecalciferol (VITAMIN D) 1000 units tablet Take 1,000 Units by mouth daily.   Yes [provider]  clopidogrel (PLAVIX) 75 MG tablet Take 75 mg by mouth daily.   Yes [provider]  diclofenac sodium (VOLTAREN) 1 % GEL Apply 2 g topically every 12 (twelve) hours as needed (for left knee pain).    Yes [provider]  docusate sodium (COLACE) 100 MG capsule Take 100 mg by mouth 2 (two) times daily.   Yes [provider]  FLUoxetine (PROZAC) 40 MG capsule Take 40 mg by mouth daily.    Yes [provider]  Fluticasone-Umeclidin-Vilant 100-62.5-25 MCG/INH AEPB Inhale 1 puff into the lungs daily.   Yes [provider]  gabapentin (NEURONTIN) 300 MG capsule Take 300 mg by mouth at bedtime.   Yes [provider]  hydrALAZINE (APRESOLINE) 25 MG tablet Take 25 mg by mouth 3 (three) times daily. 06/19/18  Yes [provider]  ipratropium (ATROVENT) 0.03 % nasal spray Place 2 sprays into both nostrils 2 (two) times daily.    Yes [provider]  ipratropium-albuterol (DUONEB) 0.5-2.5 (3) MG/3ML SOLN Take 3 mLs by nebulization 3 (three) times daily. 07/17/18  Yes Gladstone Lighter, MD  levETIRAcetam (KEPPRA XR) 500 MG 24 hr tablet Take 1 tablet (500 mg  total) by mouth daily. 11/19/17  Yes McCue, Janett Billow, NP  linagliptin (TRADJENTA) 5 MG TABS tablet Take 5 mg by mouth daily.   Yes [provider]  LORazepam (ATIVAN) 2 MG/ML injection Inject 0.5 mLs (1 mg total) into the muscle daily as needed for seizure. Patient taking differently: Inject 1 mg into the muscle See admin instructions. Inject 0.49ml (1mg ) into the muscle daily as needed for seizures - repeat after 10 minutes if seizure activity continues 06/13/18  Yes Wieting, Richard, MD  meclizine (ANTIVERT) 25 MG tablet Take 25 mg by mouth every 12 (twelve) hours as needed for dizziness.    Yes [provider]  Multiple Vitamin (MULTIVITAMIN WITH MINERALS) TABS tablet Take 1 tablet by mouth daily.   Yes [provider]  nitroGLYCERIN (NITROSTAT) 0.4 MG SL tablet Place 0.4 mg under the tongue as needed. 08/07/18  Yes [provider]  pantoprazole (PROTONIX) 40 MG tablet Take 1 tablet (40 mg total) by mouth daily. 04/29/18  Yes Mody,  Sital, MD  vitamin B-12 1000 MCG tablet Take 1 tablet (1,000 mcg total) by mouth daily. 04/30/18  Yes Mody, Ulice Bold, MD  colchicine 0.6 MG tablet Take 1 tablet (0.6 mg total) by mouth daily. Patient not taking: Reported on 09/11/2018 06/14/18   Loletha Grayer, MD  mupirocin ointment (BACTROBAN) 2 % Place 1 application into the nose 2 (two) times daily. Patient not taking: Reported on 11/21/2018 06/13/18   Loletha Grayer, MD  predniSONE (DELTASONE) 10 MG tablet Label  & dispense according to the schedule below.  4 Pills PO for 1 day, 3 Pills PO for 1 day, 2 Pills PO for 1 day, 1 Pill PO for 1 days then STOP. Patient not taking: Reported on 11/21/2018 09/09/18   Henreitta Leber, MD     Allergies Bee venom and Enalapril maleate   Family History  Problem Relation Age of Onset  . Alcohol abuse Mother   . Sickle cell anemia Daughter   . Hypertension Son   . Cancer Neg Hx   . COPD Neg Hx   . Diabetes Neg Hx   . Heart disease Neg Hx   .  Stroke Neg Hx     Social History Social History   Tobacco Use  . Smoking status: Former Smoker    Packs/day: 0.50    Years: 60.00    Pack years: 30.00    Types: Cigarettes  . Smokeless tobacco: Never Used  . Tobacco comment: quite 9mo ago-03/26/18  Substance Use Topics  . Alcohol use: No    Alcohol/week: 0.0 standard drinks    Comment: rare  . Drug use: No    Review of Systems  Constitutional:   No fever or chills.  ENT:   No sore throat. No rhinorrhea. Cardiovascular: Positive chest pain without syncope. Respiratory: Positive shortness of breath and nonproductive cough. Gastrointestinal:   Negative for abdominal pain, vomiting and diarrhea.  Musculoskeletal:   Negative for focal pain or swelling All other systems reviewed and are negative except as documented above in ROS and HPI.  ____________________________________________   PHYSICAL EXAM:  VITAL SIGNS: ED Triage Vitals  Enc Vitals Group     BP 11/21/18 1433 98/71     Pulse Rate 11/21/18 1433 (!) 108     Resp 11/21/18 1433 (!) 23     Temp 11/21/18 1433 98.5 F (36.9 C)     Temp Source 11/21/18 1433 Oral     SpO2 11/21/18 1433 100 %     Weight 11/21/18 1436 229 lb 4.5 oz (104 kg)     Height 11/21/18 1436 5\' 8"  (1.727 m)     Head Circumference --      Peak Flow --      Pain Score 11/21/18 1435 9     Pain Loc --      Pain Edu? --      Excl. in [place]? --     Vital signs reviewed, nursing assessments reviewed.   Constitutional:   Alert and oriented.  Ill-appearing. Eyes:   Conjunctivae are normal. EOMI. PERRL. ENT      Head:   Normocephalic and atraumatic.      Nose:   Wearing a mask.      Mouth/Throat: Dry mucous membranes.      Neck:   No meningismus. Full ROM. Hematological/Lymphatic/Immunilogical:   No cervical lymphadenopathy. Cardiovascular:   Tachycardia heart rate 110. Symmetric bilateral radial and DP pulses.  No murmurs. Cap refill less than 2 seconds. Respiratory: Tachypnea.  Normal  work of  breathing.  Bilateral basilar crackles. Gastrointestinal:   Soft and nontender. Non distended. There is no CVA tenderness.  No rebound, rigidity, or guarding. Musculoskeletal:   Normal range of motion in all extremities. No joint effusions.  No lower extremity tenderness.  No edema. Neurologic:   Normal speech and language.  Motor grossly intact. No acute focal neurologic deficits are appreciated.  Skin:    Skin is warm, dry and intact. No rash noted.  No petechiae, purpura, or bullae.  ____________________________________________    LABS (pertinent positives/negatives) (all labs ordered are listed, but only abnormal results are displayed) Labs Reviewed  COMPREHENSIVE METABOLIC PANEL - Abnormal; Notable for the following components:      Result Value   Creatinine, Ser 1.35 (*)    Albumin 2.3 (*)    AST 101 (*)    ALT 45 (*)    Alkaline Phosphatase 233 (*)    GFR calc non Af Amer 38 (*)    GFR calc Af Amer 44 (*)    All other components within normal limits  CBC WITH DIFFERENTIAL/PLATELET - Abnormal; Notable for the following components:   Hemoglobin 10.4 (*)    HCT 32.5 (*)    RDW 19.5 (*)    All other components within normal limits  SARS CORONAVIRUS 2 BY RT PCR (HOSPITAL ORDER, [facility])  APTT  PROTIME-INR  TROPONIN I (HIGH SENSITIVITY)  TROPONIN I (HIGH SENSITIVITY)   ____________________________________________   EKG  Interpreted by me Sinus tachycardia rate 108, normal axis intervals QRS ST segments and T waves.  ____________________________________________    RADIOLOGY  Ct Angio Chest Pe W And/or Wo Contrast  Result Date: 11/21/2018 CLINICAL DATA:  Chest pain, shortness of breath and diffuse, acute abdominal pain. EXAM: CT ANGIOGRAPHY CHEST CT ABDOMEN AND PELVIS WITH CONTRAST TECHNIQUE: Multidetector CT imaging of the chest was performed using the standard protocol during bolus administration of intravenous contrast. Multiplanar CT  image reconstructions and MIPs were obtained to evaluate the vascular anatomy. Multidetector CT imaging of the abdomen and pelvis was performed using the standard protocol during bolus administration of intravenous contrast. CONTRAST:  78mL OMNIPAQUE IOHEXOL 350 MG/ML SOLN COMPARISON:  Portable chest obtained earlier today. Chest CT dated 07/15/2018. Abdomen and pelvis CT dated 05/04/2017. FINDINGS: CTA CHEST FINDINGS Cardiovascular: Enlarged central pulmonary arteries. The main pulmonary artery measures 3.6 cm in diameter. The pulmonary arteries are normally opacified with a very small filling defect noted in a left lower lobe pulmonary artery branch on image number 136 series 3, 86 series 7 and 112 series 8. There is a smaller filling defect more inferiorly in a branch of that pulmonary artery on image number 151 series 3. No other pulmonary arterial filling defects are seen. No evidence of right ventricular strain. Minimal pericardial effusion with a maximum thickness of 15 mm anteriorly and inferiorly. Dense atheromatous calcifications, including extensive coronary artery calcifications. Mediastinum/Nodes: No enlarged mediastinal, hilar, or axillary lymph nodes. Thyroid gland, trachea, and esophagus demonstrate no significant findings. Lungs/Pleura: Mild bibasilar subsegmental atelectasis. Minimal bilateral pleural fluid. Musculoskeletal: Thoracic and lower cervical spine degenerative changes. Review of the MIP images confirms the above findings. CT ABDOMEN and PELVIS FINDINGS Hepatobiliary: No focal liver abnormality is seen. Status post cholecystectomy. No biliary dilatation. Pancreas: Unremarkable. No pancreatic ductal dilatation or surrounding inflammatory changes. Spleen: Normal in size without focal abnormality. Adrenals/Urinary Tract: The previously demonstrated 1.8 cm left adrenal mass containing a coarse curvilinear calcification currently measures 1.6 x 1.3  cm on image number 23 series 4. Stable normal  appearing right adrenal gland period. Stable small left renal cyst. The left kidney remains somewhat small. Both kidneys have small fetal lobulations. Tiny calculus in the inferior right kidney. There are also 2 tiny right renal cysts. Unremarkable ureters and urinary bladder. Stomach/Bowel: Stomach is within normal limits. Appendix appears normal. No evidence of bowel wall thickening, distention, or inflammatory changes. Vascular/Lymphatic: Extensive atheromatous arterial calcifications. The previously demonstrated 4.3 cm infrarenal abdominal aortic aneurysm currently measures 4.5 cm in maximum diameter on image number 39 series 4. This is not involving the iliac arteries. Reproductive: Uterus and bilateral adnexa are unremarkable. Other: None. Musculoskeletal: Lower thoracic spine degenerative changes and mild lumbar spine degenerative changes. Review of the MIP images confirms the above findings. IMPRESSION: 1. Two small left lower lobe pulmonary emboli. 2. Mild bibasilar subsegmental atelectasis. 3. Minimal bilateral pleural fluid. 4. Minimal pericardial effusion. 5. Extensive calcified coronary artery atherosclerosis and aortic atherosclerosis. 6. Interval mild increase in size of the previously demonstrated 4.3 cm infrarenal abdominal aortic aneurysm, currently measuring 4.5 cm in maximum diameter. 7. Tiny, nonobstructing right renal calculus. Critical Value/emergent results were called by telephone at the time of interpretation on 11/21/2018 at 6:58 pm to providerPHILLIP Odetta Forness , who verbally acknowledged these results. Electronically Signed   By: Claudie Revering M.D.   On: 11/21/2018 19:05   Ct Abdomen Pelvis W Contrast  Result Date: 11/21/2018 CLINICAL DATA:  Chest pain, shortness of breath and diffuse, acute abdominal pain. EXAM: CT ANGIOGRAPHY CHEST CT ABDOMEN AND PELVIS WITH CONTRAST TECHNIQUE: Multidetector CT imaging of the chest was performed using the standard protocol during bolus administration  of intravenous contrast. Multiplanar CT image reconstructions and MIPs were obtained to evaluate the vascular anatomy. Multidetector CT imaging of the abdomen and pelvis was performed using the standard protocol during bolus administration of intravenous contrast. CONTRAST:  36mL OMNIPAQUE IOHEXOL 350 MG/ML SOLN COMPARISON:  Portable chest obtained earlier today. Chest CT dated 07/15/2018. Abdomen and pelvis CT dated 05/04/2017. FINDINGS: CTA CHEST FINDINGS Cardiovascular: Enlarged central pulmonary arteries. The main pulmonary artery measures 3.6 cm in diameter. The pulmonary arteries are normally opacified with a very small filling defect noted in a left lower lobe pulmonary artery branch on image number 136 series 3, 86 series 7 and 112 series 8. There is a smaller filling defect more inferiorly in a branch of that pulmonary artery on image number 151 series 3. No other pulmonary arterial filling defects are seen. No evidence of right ventricular strain. Minimal pericardial effusion with a maximum thickness of 15 mm anteriorly and inferiorly. Dense atheromatous calcifications, including extensive coronary artery calcifications. Mediastinum/Nodes: No enlarged mediastinal, hilar, or axillary lymph nodes. Thyroid gland, trachea, and esophagus demonstrate no significant findings. Lungs/Pleura: Mild bibasilar subsegmental atelectasis. Minimal bilateral pleural fluid. Musculoskeletal: Thoracic and lower cervical spine degenerative changes. Review of the MIP images confirms the above findings. CT ABDOMEN and PELVIS FINDINGS Hepatobiliary: No focal liver abnormality is seen. Status post cholecystectomy. No biliary dilatation. Pancreas: Unremarkable. No pancreatic ductal dilatation or surrounding inflammatory changes. Spleen: Normal in size without focal abnormality. Adrenals/Urinary Tract: The previously demonstrated 1.8 cm left adrenal mass containing a coarse curvilinear calcification currently measures 1.6 x 1.3 cm on  image number 23 series 4. Stable normal appearing right adrenal gland period. Stable small left renal cyst. The left kidney remains somewhat small. Both kidneys have small fetal lobulations. Tiny calculus in the inferior right kidney. There are also 2 tiny right  renal cysts. Unremarkable ureters and urinary bladder. Stomach/Bowel: Stomach is within normal limits. Appendix appears normal. No evidence of bowel wall thickening, distention, or inflammatory changes. Vascular/Lymphatic: Extensive atheromatous arterial calcifications. The previously demonstrated 4.3 cm infrarenal abdominal aortic aneurysm currently measures 4.5 cm in maximum diameter on image number 39 series 4. This is not involving the iliac arteries. Reproductive: Uterus and bilateral adnexa are unremarkable. Other: None. Musculoskeletal: Lower thoracic spine degenerative changes and mild lumbar spine degenerative changes. Review of the MIP images confirms the above findings. IMPRESSION: 1. Two small left lower lobe pulmonary emboli. 2. Mild bibasilar subsegmental atelectasis. 3. Minimal bilateral pleural fluid. 4. Minimal pericardial effusion. 5. Extensive calcified coronary artery atherosclerosis and aortic atherosclerosis. 6. Interval mild increase in size of the previously demonstrated 4.3 cm infrarenal abdominal aortic aneurysm, currently measuring 4.5 cm in maximum diameter. 7. Tiny, nonobstructing right renal calculus. Critical Value/emergent results were called by telephone at the time of interpretation on 11/21/2018 at 6:58 pm to providerPHILLIP Laverda Stribling , who verbally acknowledged these results. Electronically Signed   By: Claudie Revering M.D.   On: 11/21/2018 19:05   Dg Chest Portable 1 View  Result Date: 11/21/2018 CLINICAL DATA:  Short of breath, chest pain EXAM: PORTABLE CHEST 1 VIEW COMPARISON:  Radiograph 09/06/2010 FINDINGS: Stable enlarged cardiac silhouette. Low lung volumes. Mild venous congestion. No focal infiltrate. LEFT lung base  poorly evaluated. IMPRESSION: 1. Cardiomegaly and mild venous congestion. 2. Low lung volumes. 3. No focal infiltrate. Electronically Signed   By: Suzy Bouchard M.D.   On: 11/21/2018 15:34    ____________________________________________   PROCEDURES Angiocath insertion  Date/Time: 11/21/2018 4:41 PM Performed by: Carrie Mew, MD Authorized by: Carrie Mew, MD  Consent: The procedure was performed in an emergent situation. Risks and benefits: risks, benefits and alternatives were discussed Consent given by: patient Patient tolerance: patient tolerated the procedure well with no immediate complications Comments: L forearm, 20 gauge. Continuous [place] visualization     .Critical Care Performed by: Carrie Mew, MD Authorized by: Carrie Mew, MD   Critical care provider statement:    Critical care time (minutes):  35   Critical care time was exclusive of:  Separately billable procedures and treating other patients   Critical care was necessary to treat or prevent imminent or life-threatening deterioration of the following conditions:  Circulatory failure and respiratory failure   Critical care was time spent personally by me on the following activities:  Development of treatment plan with patient or surrogate, discussions with consultants, evaluation of patient's response to treatment, examination of patient, obtaining history from patient or surrogate, ordering and performing treatments and interventions, ordering and review of laboratory studies, ordering and review of radiographic studies, pulse oximetry, re-evaluation of patient's condition and review of old charts    ____________________________________________  DIFFERENTIAL DIAGNOSIS   Non-STEMI, heart failure exacerbation, pneumonia, COVID-19, pleural effusion, pulmonary edema, biliary disease, bowel obstruction  CLINICAL IMPRESSION / ASSESSMENT AND PLAN / ED COURSE  Medications ordered in the  ED: Medications  heparin bolus via infusion 4,500 Units (has no administration in time range)    Followed by  heparin ADULT infusion 100 units/mL (25000 units/260mL sodium chloride 0.45%) (has no administration in time range)  sodium chloride 0.9 % bolus 500 mL (0 mLs Intravenous Stopped 11/21/18 1710)  iohexol (OMNIPAQUE) 350 MG/ML injection 60 mL (60 mLs Intravenous Contrast Given 11/21/18 1740)    Pertinent labs & imaging results that were available during my care of the patient were reviewed by  me and considered in my medical decision making (see chart for details).  Karle Vought was evaluated in Emergency Department on 11/21/2018 for the symptoms described in the history of present illness. She was evaluated in the context of the global COVID-19 pandemic, which necessitated consideration that the patient might be at risk for infection with the SARS-CoV-2 virus that causes COVID-19. Institutional protocols and algorithms that pertain to the evaluation of patients at risk for COVID-19 are in a state of rapid change based on information released by regulatory bodies including the CDC and federal and state organizations. These policies and algorithms were followed during the patient's care in the ED.   Patient presents with malaise, decreased oral intake, clinically appearing to be dehydrated which is the cause of her tachycardia.  Will give IV fluids, check labs and chest x-ray.  ----------------------------------------- 4:41 PM on 11/21/2018 -----------------------------------------  Initial x-ray labs overall unremarkable.  Due to her recent COVID diagnosis, poor mobility, I will get a CT angiogram to evaluate for PE.  Clinical Course as of Nov 21 1915  Thu Nov 21, 2018  1521 Ultrasound peripheral IV placed by me.   [PS]  [place] CT results discussed with radiologist Dr. Mariea Clonts who notes 2 small left lower lobe PEs which matches her symptoms.  Due to tachypnea and tachycardia and her symptoms,  will need to start heparin and admit to the hospital for initial management.   [PS]    Clinical Course User Index [PS] Carrie Mew, MD     ____________________________________________   FINAL CLINICAL IMPRESSION(S) / ED DIAGNOSES    Final diagnoses:  Other acute pulmonary embolism without acute cor pulmonale ([place])  Chest pain, unspecified type  Chronic respiratory failure with hypoxia [facility])     ED Discharge Orders    None      Portions of this note were generated with dragon dictation software. Dictation errors may occur despite best attempts at proofreading.   Carrie Mew, MD 11/21/18 [phone]

## 2018-11-21 NOTE — Progress Notes (Signed)
Advanced care plan.  Purpose of the Encounter: CODE STATUS  Parties in Attendance: Patient herself  Patient's Decision Capacity: Intact  Subjective/Patient's story: Katherine [name]  is a 78 y.o. female with a known history of congestive heart failure, chronic kidney disease stage III, COPD, diabetes type 2, GERD, hypertension, morbid obesity, history of seizures, history of previous stroke who resides in a [facility] who is presenting with chest pain or shortness of breath.   Objective/Medical story  I discussed with the patient regarding her desires for cardiac and pulmonary resuscitation  Goals of care determination:  Patient states she would like everything to be done   CODE STATUS: Full code   Time spent discussing advanced care planning: 16 minutes

## 2018-11-21 NOTE — ED Notes (Signed)
Patient transported to CT 

## 2018-11-22 ENCOUNTER — Inpatient Hospital Stay: Payer: Medicare HMO

## 2018-11-22 LAB — CBC
HCT: 27.1 % — ABNORMAL LOW (ref 36.0–46.0)
Hemoglobin: 8.5 g/dL — ABNORMAL LOW (ref 12.0–15.0)
MCH: 25.9 pg — ABNORMAL LOW (ref 26.0–34.0)
MCHC: 31.4 g/dL (ref 30.0–36.0)
MCV: 82.6 fL (ref 80.0–100.0)
Platelets: 355 10*3/uL (ref 150–400)
RBC: 3.28 MIL/uL — ABNORMAL LOW (ref 3.87–5.11)
RDW: 19.3 % — ABNORMAL HIGH (ref 11.5–15.5)
WBC: 7.1 10*3/uL (ref 4.0–10.5)
nRBC: 0 % (ref 0.0–0.2)

## 2018-11-22 LAB — GLUCOSE, CAPILLARY
Glucose-Capillary: 80 mg/dL (ref 70–99)
Glucose-Capillary: 100 mg/dL — ABNORMAL HIGH (ref 70–99)
Glucose-Capillary: 121 mg/dL — ABNORMAL HIGH (ref 70–99)
Glucose-Capillary: 102 mg/dL — ABNORMAL HIGH (ref 70–99)

## 2018-11-22 LAB — BASIC METABOLIC PANEL
Anion gap: 8 (ref 5–15)
BUN: 19 mg/dL (ref 8–23)
CO2: 25 mmol/L (ref 22–32)
Calcium: 8.8 mg/dL — ABNORMAL LOW (ref 8.9–10.3)
Chloride: 104 mmol/L (ref 98–111)
Creatinine, Ser: 1.41 mg/dL — ABNORMAL HIGH (ref 0.44–1.00)
GFR calc Af Amer: 42 mL/min — ABNORMAL LOW (ref >60)
GFR calc non Af Amer: 36 mL/min — ABNORMAL LOW (ref >60)
Glucose, Bld: 111 mg/dL — ABNORMAL HIGH (ref 70–99)
Potassium: 4.1 mmol/L (ref 3.5–5.1)
Sodium: 137 mmol/L (ref 135–145)

## 2018-11-22 LAB — HEPARIN LEVEL (UNFRACTIONATED): Heparin Unfractionated: 1.31 IU/mL — ABNORMAL HIGH (ref 0.30–0.70)

## 2018-11-22 LAB — MRSA PCR SCREENING: MRSA by PCR: POSITIVE — AB

## 2018-11-22 MED ORDER — APIXABAN 5 MG PO TABS
10.0000 mg | ORAL_TABLET | Freq: Two times a day (BID) | ORAL | Status: DC
  Administered 2018-11-22 – 2018-11-26 (×8): 10 mg via ORAL
  Filled 2018-11-22 (×2): qty 2
  Filled 2018-11-22: qty 4
  Filled 2018-11-22 (×5): qty 2

## 2018-11-22 MED ORDER — MUPIROCIN 2 % EX OINT
TOPICAL_OINTMENT | Freq: Two times a day (BID) | CUTANEOUS | Status: DC
  Administered 2018-11-22 – 2018-11-25 (×7): via NASAL
  Filled 2018-11-22: qty 22

## 2018-11-22 MED ORDER — INFLUENZA VAC A&B SA ADJ QUAD 0.5 ML IM PRSY
0.5000 mL | PREFILLED_SYRINGE | INTRAMUSCULAR | Status: AC
  Administered 2018-11-23: 0.5 mL via INTRAMUSCULAR
  Filled 2018-11-22: qty 0.5

## 2018-11-22 MED ORDER — APIXABAN 5 MG PO TABS: 5.0000 mg | ORAL_TABLET | Freq: Two times a day (BID) | ORAL | Status: DC

## 2018-11-22 MED ORDER — HEPARIN (PORCINE) 25000 UT/250ML-% IV SOLN
1200.0000 [IU]/h | INTRAVENOUS | Status: DC
  Administered 2018-11-22: 1200 [IU]/h via INTRAVENOUS

## 2018-11-22 MED ORDER — IPRATROPIUM-ALBUTEROL 0.5-2.5 (3) MG/3ML IN SOLN
3.0000 mL | Freq: Three times a day (TID) | RESPIRATORY_TRACT | Status: DC
  Administered 2018-11-22 – 2018-11-26 (×12): 3 mL via RESPIRATORY_TRACT
  Filled 2018-11-22 (×13): qty 3

## 2018-11-22 NOTE — NC FL2 (Signed)
[place] LEVEL OF CARE SCREENING TOOL     IDENTIFICATION  Patient Name: Katherine [name] Birthdate: [birth year] Sex: female Admission Date (Current Location): 11/21/2018  [place] and [place] Number:  Engineering geologist and Address:  [facility], [address] [place], [place] [address]      Provider Number: [id number]  Attending Physician Name and Address:  Henreitta Leber, MD  Relative Name and Phone Number:       Current Level of Care: Hospital Recommended Level of Care: [place] Prior Approval Number:    Date Approved/Denied:   PASRR Number:    Discharge Plan: SNF    Current Diagnoses: Patient Active Problem List   Diagnosis Date Noted  . Pulmonary embolism ([place]) 11/21/2018  . Delirium   . UTI (urinary tract infection) 08/21/2018  . Chest pain 07/15/2018  . Palliative care encounter 05/10/2018  . Localized edema 05/10/2018  . Shortness of breath 05/10/2018  . Hematemesis 04/28/2018  . Influenza A 04/13/2018  . Acute on chronic congestive heart failure ([place])   . COPD with acute exacerbation ([place]) 02/24/2018  . Chronic diastolic heart failure ([place]) 12/31/2017  . Lymphedema 12/31/2017  . COPD exacerbation ([place]) 11/30/2017  . Urinary tract infection 11/22/2017  . 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[place]) 11/16/2017  . Diabetes mellitus type 2, uncomplicated ([place]) 0000000  . Pressure injury of skin 10/29/2017  . Acute kidney injury superimposed on CKD ([place]) 10/24/2017  . Severe sepsis ([place]) 10/24/2017  . Possible Seizures ([place]) 10/08/2017  . Hyperlipemia 10/08/2017  . 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 Stroke-like episode ([place]) s/p IV tpa 10/04/2017  . Palliative care by specialist   . Elevated rheumatoid factor 09/05/2017  . Frequent hospital admissions 08/22/2017  . Aphasia 06/28/2017  . Hand pain 03/21/2017  . Dry skin 03/21/2017  . Pain in finger of left hand 09/12/2016   . Chronic fatigue 06/12/2016  . Left knee pain 06/12/2016  . Goals of care, counseling/discussion 03/13/2016  . Primary localized osteoarthritis of right knee 02/08/2016  . OSA (obstructive sleep apnea) 09/16/2015  . Rotator cuff syndrome 09/07/2015  . 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 Sleep disturbance 04/14/2015  . Coronary artery disease 03/14/2015  . 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 Acute respiratory failure with hypoxia ([place]) 09/16/2014  . 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 Anxiety   . Tobacco abuse   . Prurigo nodularis   . GERD (gastroesophageal reflux disease)   . COPD (chronic obstructive pulmonary disease) ([place])   . Osteoarthritis   . Vitamin D deficiency disease   . Chronic constipation   . CKD (chronic kidney disease), stage III   . Essential hypertension 05/20/2013  . Morbid obesity ([place]) 05/20/2013    Orientation RESPIRATION BLADDER Height & Weight     Self, Time, Place  O2(2 liters) Incontinent Weight: 229 lb 4.5 oz (104 kg) Height:  5\' 8"  (172.7 cm)  BEHAVIORAL SYMPTOMS/MOOD NEUROLOGICAL BOWEL NUTRITION STATUS      Incontinent Diet(Heart Healthy)  AMBULATORY STATUS COMMUNICATION OF NEEDS Skin   Extensive Assist Verbally Normal                       Personal Care Assistance Level of Assistance  Bathing, Feeding, Dressing Bathing Assistance: Maximum assistance Feeding assistance: Independent Dressing Assistance: Limited assistance     Functional Limitations Info  Sight, Hearing, Speech Sight Info: Adequate Hearing Info: Adequate Speech Info: Adequate    SPECIAL  CARE FACTORS FREQUENCY                       Contractures Contractures Info: Not present    Additional Factors Info  Code Status, Allergies Code Status Info: Full Code Allergies Info: Enalapril Maleate, Bee Venom           Current Medications (11/22/2018):  This is the current hospital active medication list Current Facility-Administered Medications  Medication  Dose Route Frequency Provider Last Rate Last Dose  . 0.9 %  sodium chloride infusion  250 mL Intravenous PRN Dustin Flock, MD      . acetaminophen (TYLENOL) tablet 650 mg  650 mg Oral Q6H PRN Dustin Flock, MD       Or  . acetaminophen (TYLENOL) suppository 650 mg  650 mg Rectal Q6H PRN Dustin Flock, MD      . acetaminophen (TYLENOL) tablet 650 mg  650 mg Oral TID Dustin Flock, MD   650 mg at 11/22/18 1003  . albuterol (PROVENTIL) (2.5 MG/3ML) 0.083% nebulizer solution 3 mL  3 mL Nebulization Q8H PRN Dustin Flock, MD      . apixaban Arne Cleveland) tablet 10 mg  10 mg Oral BID Dallie Piles, RPH   10 mg at 11/22/18 1123   Followed by  . [START ON 11/29/2018] apixaban (ELIQUIS) tablet 5 mg  5 mg Oral BID Dallie Piles, [facility]      . atorvastatin (LIPITOR) tablet 80 mg  80 mg Oral QPM Dustin Flock, MD   80 mg at 11/21/18 2314  . cholecalciferol (VITAMIN D3) tablet 1,000 Units  1,000 Units Oral Daily Dustin Flock, MD   1,000 Units at 11/22/18 1004  . clopidogrel (PLAVIX) tablet 75 mg  75 mg Oral Daily Dustin Flock, MD   75 mg at 11/22/18 1004  . diclofenac sodium (VOLTAREN) 1 % transdermal gel 2 g  2 g Topical Q12H PRN Dustin Flock, MD      . docusate sodium (COLACE) capsule 100 mg  100 mg Oral BID Dustin Flock, MD   100 mg at 11/21/18 2309  . FLUoxetine (PROZAC) capsule 40 mg  40 mg Oral Daily Dustin Flock, MD   40 mg at 11/22/18 1005  . gabapentin (NEURONTIN) capsule 300 mg  300 mg Oral QHS Dustin Flock, MD   300 mg at 11/21/18 2308  . hydrALAZINE (APRESOLINE) tablet 25 mg  25 mg Oral TID Dustin Flock, MD   25 mg at 11/22/18 1004  . [START ON 11/23/2018] influenza vaccine adjuvanted (FLUAD) injection 0.5 mL  0.5 mL Intramuscular Tomorrow-1000 Dustin Flock, MD      . insulin aspart (novoLOG) injection 0-5 Units  0-5 Units Subcutaneous QHS Dustin Flock, MD      . insulin aspart (novoLOG) injection 0-9 Units  0-9 Units Subcutaneous TID WC Dustin Flock, MD       . ipratropium (ATROVENT) 0.03 % nasal spray 2 spray  2 spray Each Nare BID Dustin Flock, MD   2 spray at 11/22/18 1006  . ipratropium-albuterol (DUONEB) 0.5-2.5 (3) MG/3ML nebulizer solution 3 mL  3 mL Nebulization TID Henreitta Leber, MD   3 mL at 11/22/18 1347  . levETIRAcetam (KEPPRA XR) 24 hr tablet 500 mg  500 mg Oral Daily Dustin Flock, MD   500 mg at 11/22/18 1005  . linagliptin (TRADJENTA) tablet 5 mg  5 mg Oral Daily Dustin Flock, MD   5 mg at 11/22/18 1005  . loratadine (CLARITIN) tablet 10 mg  10 mg Oral Daily Dustin Flock, MD   10 mg at 11/22/18 1004  . meclizine (ANTIVERT) tablet 25 mg  25 mg Oral Q12H PRN Dustin Flock, MD      . mometasone-formoterol [facility]) 100-5 MCG/ACT inhaler 2 puff  2 puff Inhalation BID Dustin Flock, MD   2 puff at 11/22/18 1007  . multivitamin with minerals tablet 1 tablet  1 tablet Oral Daily Dustin Flock, MD   1 tablet at 11/22/18 1003  . mupirocin ointment (BACTROBAN) 2 %   Nasal BID Dustin Flock, MD      . nitroGLYCERIN (NITROSTAT) SL tablet 0.4 mg  0.4 mg Sublingual Q5 min PRN Dustin Flock, MD      . ondansetron [facility]) tablet 4 mg  4 mg Oral Q6H PRN Dustin Flock, MD       Or  . ondansetron (ZOFRAN) injection 4 mg  4 mg Intravenous Q6H PRN Dustin Flock, MD      . pantoprazole (PROTONIX) EC tablet 40 mg  40 mg Oral Daily Dustin Flock, MD   40 mg at 11/22/18 1004  . sodium chloride flush (NS) 0.9 % injection 3 mL  3 mL Intravenous Q12H Dustin Flock, MD   3 mL at 11/22/18 1008  . sodium chloride flush (NS) 0.9 % injection 3 mL  3 mL Intravenous PRN Dustin Flock, MD      . umeclidinium bromide (INCRUSE ELLIPTA) 62.5 MCG/INH 1 puff  1 puff Inhalation Daily Dustin Flock, MD   1 puff at 11/22/18 1007  . vitamin B-12 (CYANOCOBALAMIN) tablet 1,000 mcg  1,000 mcg Oral Daily Dustin Flock, MD   1,000 mcg at 11/22/18 1004     Discharge Medications: Please see discharge summary for a list of discharge  medications.  Relevant Imaging Results:  Relevant Lab Results:   Additional Information    Brenley Priore  Louretta Shorten, LCSWA

## 2018-11-22 NOTE — Consult Note (Signed)
[place] for apixaban initiation Indication: pulmonary embolus  Patient Measurements: Height: 5\' 8"  (172.7 cm) Weight: 229 lb 4.5 oz (104 kg) IBW/kg (Calculated) : 63.9  Vital Signs: Temp: 98.8 F (37.1 C) (10/09 0651) Temp Source: Oral (10/09 0651) BP: 113/71 (10/09 0651) Pulse Rate: 100 (10/09 0651)  Labs: Recent Labs    11/21/18 1443 11/21/18 1702 11/21/18 2028 11/22/18 0510  HGB 10.4*  --   --  8.5*  HCT 32.5*  --   --  27.1*  PLT 397  --   --  355  APTT  --   --  28  --   LABPROT  --   --  12.4  --   INR  --   --  0.9  --   HEPARINUNFRC  --   --   --  1.31*  CREATININE 1.35*  --   --  1.41*  TROPONINIHS 6 4  --   --     Estimated Creatinine Clearance: 42.1 mL/min (A) (by C-G formula based on SCr of 1.41 mg/dL (H)).   Medical History: Past Medical History:  Diagnosis Date  . Angioedema   . Anxiety and depression   . CHF (congestive heart failure) ([place])   . Chronic constipation   . Chronic kidney disease    stage 3  . COPD (chronic obstructive pulmonary disease) ([place])   . Diabetes mellitus without complication ([place])   . Gallstones   . GERD (gastroesophageal reflux disease)   . Hypertension   . Left ventricular hypertrophy   . Osteoarthritis   . Prurigo nodularis   . Seizures ([place])   . Stroke ([place])   . Tobacco abuse   . Vitamin D deficiency disease     Assessment: Pharmacy originally consulted for heparin infusion dosing and  Monitoring for 78 yo female admitted with PE. CT Chest shows two small left lower lobe pulmonary emboli. No anticoagulants PTA. Hgb trending lower, PLT stable. The patient is now being transitioned to apixaban.   Goal of Therapy:  Monitor platelets by anticoagulation protocol: Yes   Plan:   Start apixaban 10 mg BID x 7 days, then 5 mg BID thereafter  Begin apixaban one hour after heparin drip has been discontinued  CBC at least every 3 days  Dallie Piles, PharmD Clinical  Pharmacist 11/22/2018 7:45 AM

## 2018-11-22 NOTE — Consult Note (Signed)
ANTICOAGULATION CONSULT NOTE - Initial Consult  Pharmacy Consult for Heparin infusion  Indication: pulmonary embolus  Allergies  Allergen Reactions  . Bee Venom Swelling  . Enalapril Maleate Swelling    Patient Measurements: Height: 5\' 8"  (172.7 cm) Weight: 229 lb 4.5 oz (104 kg) IBW/kg (Calculated) : 63.9 Heparin Dosing Weight: 87 Kg  Vital Signs: Temp: 97.8 F (36.6 C) (10/08 2200) Temp Source: Oral (10/08 2200) BP: 110/79 (10/08 2200) Pulse Rate: 104 (10/08 2200)  Labs: Recent Labs    11/21/18 1443 11/21/18 1702 11/21/18 2028 11/22/18 0510  HGB 10.4*  --   --  8.5*  HCT 32.5*  --   --  27.1*  PLT 397  --   --  355  APTT  --   --  28  --   LABPROT  --   --  12.4  --   INR  --   --  0.9  --   HEPARINUNFRC  --   --   --  1.31*  CREATININE 1.35*  --   --  1.41*  TROPONINIHS 6 4  --   --     Estimated Creatinine Clearance: 42.1 mL/min (A) (by C-G formula based on SCr of 1.41 mg/dL (H)).   Medical History: Past Medical History:  Diagnosis Date  . Angioedema   . Anxiety and depression   . CHF (congestive heart failure) ([place])   . Chronic constipation   . Chronic kidney disease    stage 3  . COPD (chronic obstructive pulmonary disease) ([place])   . Diabetes mellitus without complication ([place])   . Gallstones   . GERD (gastroesophageal reflux disease)   . Hypertension   . Left ventricular hypertrophy   . Osteoarthritis   . Prurigo nodularis   . Seizures ([place])   . Stroke ([place])   . Tobacco abuse   . Vitamin D deficiency disease     Assessment: Pharmacy consulted for heparin infusion dosing and  Monitoring for 78 yo female admitted with PE. CT Chest shows two small left lower lobe pulmonary emboli. No anticoagulants PTA.   Goal of Therapy:  Heparin level 0.3-0.7 units/ml Monitor platelets by anticoagulation protocol: Yes   Plan:  1009 @ 0500 HL 1.33 supratherapeutic. Will hold heparin drip for 1 hour and will restart at 0700 at a rate of 1200 units/hr,  patient's hgb dropped by 2 g/dL, RN states there's a little bit of blood when she's wiped from bowel movements, but not bright red. Will check HL @ 1500 and continue to monitor.  Tobie Lords, PharmD, BCPS Clinical Pharmacist 11/22/2018 6:00 AM

## 2018-11-22 NOTE — Progress Notes (Signed)
[facility] NAME: Katherine [name]    MR#:  [id number]  DATE OF BIRTH:  [birth year]  SUBJECTIVE:   Patient presented to the hospital due to shortness of breath and chest pain.  Patient CT scan was positive for small pulmonary embolism.  Patient denies any hemoptysis still complaining of some pleuritic chest pain.  No other acute complaints or events overnight.  REVIEW OF SYSTEMS:    Review of Systems  Constitutional: Negative for chills and fever.  HENT: Negative for congestion and tinnitus.   Eyes: Negative for blurred vision and double vision.  Respiratory: Positive for shortness of breath. Negative for cough and wheezing.   Cardiovascular: Positive for chest pain. Negative for orthopnea and PND.  Gastrointestinal: Negative for abdominal pain, diarrhea, nausea and vomiting.  Genitourinary: Negative for dysuria and hematuria.  Neurological: Negative for dizziness, sensory change and focal weakness.  All other systems reviewed and are negative.   Nutrition: Heart Healthy/Carb control Tolerating Diet: Yes Tolerating PT: bedbound at baseline.   DRUG ALLERGIES:   Allergies  Allergen Reactions   Bee Venom Swelling   Enalapril Maleate Swelling    VITALS:  Blood pressure 104/64, pulse (!) 101, temperature (!) 97.5 F (36.4 C), temperature source Oral, resp. rate 16, height 5\' 8"  (1.727 m), weight 104 kg, SpO2 100 %.  PHYSICAL EXAMINATION:   Physical Exam  GENERAL:  78 y.o.-year-old obese patient lying in bed in no acute distress.  EYES: Pupils equal, round, reactive to light and accommodation. No scleral icterus. Extraocular muscles intact.  HEENT: Head atraumatic, normocephalic. Oropharynx and nasopharynx clear.  NECK:  Supple, no jugular venous distention. No thyroid enlargement, no tenderness.  LUNGS: Normal breath sounds bilaterally, no wheezing, rales, rhonchi. No use of accessory muscles of respiration.    CARDIOVASCULAR: S1, S2 normal. No murmurs, rubs, or gallops.  ABDOMEN: Soft, nontender, nondistended. Bowel sounds present. No organomegaly or mass.  EXTREMITIES: No cyanosis, clubbing, + 1-2 edema b/l  NEUROLOGIC: Cranial nerves II through XII are intact. No focal Motor or sensory deficits b/l. Globally weak.   PSYCHIATRIC: The patient is alert and oriented x 3.  SKIN: No obvious rash, lesion, or ulcer.    LABORATORY PANEL:   CBC Recent Labs  Lab 11/22/18 0510  WBC 7.1  HGB 8.5*  HCT 27.1*  PLT 355   ------------------------------------------------------------------------------------------------------------------  Chemistries  Recent Labs  Lab 11/21/18 1443 11/22/18 0510  NA 135 137  K 4.3 4.1  CL 101 104  CO2 27 25  GLUCOSE 89 111*  BUN 18 19  CREATININE 1.35* 1.41*  CALCIUM 9.5 8.8*  AST 101*  --   ALT 45*  --   ALKPHOS 233*  --   BILITOT 0.6  --    ------------------------------------------------------------------------------------------------------------------  Cardiac Enzymes No results for input(s): TROPONINI in the last 168 hours. ------------------------------------------------------------------------------------------------------------------  RADIOLOGY:  Ct Angio Chest Pe W And/or Wo Contrast  Result Date: 11/21/2018 CLINICAL DATA:  Chest pain, shortness of breath and diffuse, acute abdominal pain. EXAM: CT ANGIOGRAPHY CHEST CT ABDOMEN AND PELVIS WITH CONTRAST TECHNIQUE: Multidetector CT imaging of the chest was performed using the standard protocol during bolus administration of intravenous contrast. Multiplanar CT image reconstructions and MIPs were obtained to evaluate the vascular anatomy. Multidetector CT imaging of the abdomen and pelvis was performed using the standard protocol during bolus administration of intravenous contrast. CONTRAST:  59mL OMNIPAQUE IOHEXOL 350 MG/ML SOLN COMPARISON:  Portable chest obtained earlier today.  Chest CT dated  07/15/2018. Abdomen and pelvis CT dated 05/04/2017. FINDINGS: CTA CHEST FINDINGS Cardiovascular: Enlarged central pulmonary arteries. The main pulmonary artery measures 3.6 cm in diameter. The pulmonary arteries are normally opacified with a very small filling defect noted in a left lower lobe pulmonary artery branch on image number 136 series 3, 86 series 7 and 112 series 8. There is a smaller filling defect more inferiorly in a branch of that pulmonary artery on image number 151 series 3. No other pulmonary arterial filling defects are seen. No evidence of right ventricular strain. Minimal pericardial effusion with a maximum thickness of 15 mm anteriorly and inferiorly. Dense atheromatous calcifications, including extensive coronary artery calcifications. Mediastinum/Nodes: No enlarged mediastinal, hilar, or axillary lymph nodes. Thyroid gland, trachea, and esophagus demonstrate no significant findings. Lungs/Pleura: Mild bibasilar subsegmental atelectasis. Minimal bilateral pleural fluid. Musculoskeletal: Thoracic and lower cervical spine degenerative changes. Review of the MIP images confirms the above findings. CT ABDOMEN and PELVIS FINDINGS Hepatobiliary: No focal liver abnormality is seen. Status post cholecystectomy. No biliary dilatation. Pancreas: Unremarkable. No pancreatic ductal dilatation or surrounding inflammatory changes. Spleen: Normal in size without focal abnormality. Adrenals/Urinary Tract: The previously demonstrated 1.8 cm left adrenal mass containing a coarse curvilinear calcification currently measures 1.6 x 1.3 cm on image number 23 series 4. Stable normal appearing right adrenal gland period. Stable small left renal cyst. The left kidney remains somewhat small. Both kidneys have small fetal lobulations. Tiny calculus in the inferior right kidney. There are also 2 tiny right renal cysts. Unremarkable ureters and urinary bladder. Stomach/Bowel: Stomach is within normal limits. Appendix  appears normal. No evidence of bowel wall thickening, distention, or inflammatory changes. Vascular/Lymphatic: Extensive atheromatous arterial calcifications. The previously demonstrated 4.3 cm infrarenal abdominal aortic aneurysm currently measures 4.5 cm in maximum diameter on image number 39 series 4. This is not involving the iliac arteries. Reproductive: Uterus and bilateral adnexa are unremarkable. Other: None. Musculoskeletal: Lower thoracic spine degenerative changes and mild lumbar spine degenerative changes. Review of the MIP images confirms the above findings. IMPRESSION: 1. Two small left lower lobe pulmonary emboli. 2. Mild bibasilar subsegmental atelectasis. 3. Minimal bilateral pleural fluid. 4. Minimal pericardial effusion. 5. Extensive calcified coronary artery atherosclerosis and aortic atherosclerosis. 6. Interval mild increase in size of the previously demonstrated 4.3 cm infrarenal abdominal aortic aneurysm, currently measuring 4.5 cm in maximum diameter. 7. Tiny, nonobstructing right renal calculus. Critical Value/emergent results were called by telephone at the time of interpretation on 11/21/2018 at 6:58 pm to providerPHILLIP STAFFORD , who verbally acknowledged these results. Electronically Signed   By: Claudie Revering M.D.   On: 11/21/2018 19:05   Ct Abdomen Pelvis W Contrast  Result Date: 11/21/2018 CLINICAL DATA:  Chest pain, shortness of breath and diffuse, acute abdominal pain. EXAM: CT ANGIOGRAPHY CHEST CT ABDOMEN AND PELVIS WITH CONTRAST TECHNIQUE: Multidetector CT imaging of the chest was performed using the standard protocol during bolus administration of intravenous contrast. Multiplanar CT image reconstructions and MIPs were obtained to evaluate the vascular anatomy. Multidetector CT imaging of the abdomen and pelvis was performed using the standard protocol during bolus administration of intravenous contrast. CONTRAST:  56mL OMNIPAQUE IOHEXOL 350 MG/ML SOLN COMPARISON:  Portable  chest obtained earlier today. Chest CT dated 07/15/2018. Abdomen and pelvis CT dated 05/04/2017. FINDINGS: CTA CHEST FINDINGS Cardiovascular: Enlarged central pulmonary arteries. The main pulmonary artery measures 3.6 cm in diameter. The pulmonary arteries are normally opacified with a very small filling defect noted in a left  lower lobe pulmonary artery branch on image number 136 series 3, 86 series 7 and 112 series 8. There is a smaller filling defect more inferiorly in a branch of that pulmonary artery on image number 151 series 3. No other pulmonary arterial filling defects are seen. No evidence of right ventricular strain. Minimal pericardial effusion with a maximum thickness of 15 mm anteriorly and inferiorly. Dense atheromatous calcifications, including extensive coronary artery calcifications. Mediastinum/Nodes: No enlarged mediastinal, hilar, or axillary lymph nodes. Thyroid gland, trachea, and esophagus demonstrate no significant findings. Lungs/Pleura: Mild bibasilar subsegmental atelectasis. Minimal bilateral pleural fluid. Musculoskeletal: Thoracic and lower cervical spine degenerative changes. Review of the MIP images confirms the above findings. CT ABDOMEN and PELVIS FINDINGS Hepatobiliary: No focal liver abnormality is seen. Status post cholecystectomy. No biliary dilatation. Pancreas: Unremarkable. No pancreatic ductal dilatation or surrounding inflammatory changes. Spleen: Normal in size without focal abnormality. Adrenals/Urinary Tract: The previously demonstrated 1.8 cm left adrenal mass containing a coarse curvilinear calcification currently measures 1.6 x 1.3 cm on image number 23 series 4. Stable normal appearing right adrenal gland period. Stable small left renal cyst. The left kidney remains somewhat small. Both kidneys have small fetal lobulations. Tiny calculus in the inferior right kidney. There are also 2 tiny right renal cysts. Unremarkable ureters and urinary bladder. Stomach/Bowel:  Stomach is within normal limits. Appendix appears normal. No evidence of bowel wall thickening, distention, or inflammatory changes. Vascular/Lymphatic: Extensive atheromatous arterial calcifications. The previously demonstrated 4.3 cm infrarenal abdominal aortic aneurysm currently measures 4.5 cm in maximum diameter on image number 39 series 4. This is not involving the iliac arteries. Reproductive: Uterus and bilateral adnexa are unremarkable. Other: None. Musculoskeletal: Lower thoracic spine degenerative changes and mild lumbar spine degenerative changes. Review of the MIP images confirms the above findings. IMPRESSION: 1. Two small left lower lobe pulmonary emboli. 2. Mild bibasilar subsegmental atelectasis. 3. Minimal bilateral pleural fluid. 4. Minimal pericardial effusion. 5. Extensive calcified coronary artery atherosclerosis and aortic atherosclerosis. 6. Interval mild increase in size of the previously demonstrated 4.3 cm infrarenal abdominal aortic aneurysm, currently measuring 4.5 cm in maximum diameter. 7. Tiny, nonobstructing right renal calculus. Critical Value/emergent results were called by telephone at the time of interpretation on 11/21/2018 at 6:58 pm to providerPHILLIP STAFFORD , who verbally acknowledged these results. Electronically Signed   By: Claudie Revering M.D.   On: 11/21/2018 19:05   US Venous Img Lower Bilateral  Result Date: 11/22/2018 CLINICAL DATA:  Bilateral lower extremity pain and edema. History of previous DVT and pulmonary embolism. Former smoker. Patient is currently on anticoagulation. Evaluate for acute or chronic DVT. EXAM: BILATERAL LOWER EXTREMITY VENOUS DOPPLER ULTRASOUND TECHNIQUE: Gray-scale sonography with graded compression, as well as color Doppler and duplex ultrasound were performed to evaluate the lower extremity deep venous systems from the level of the common femoral vein and including the common femoral, femoral, profunda femoral, popliteal and calf veins  including the posterior tibial, peroneal and gastrocnemius veins when visible. The superficial great saphenous vein was also interrogated. Spectral Doppler was utilized to evaluate flow at rest and with distal augmentation maneuvers in the common femoral, femoral and popliteal veins. COMPARISON:  Bilateral lower extremity venous Doppler ultrasound-07/15/2018 FINDINGS: RIGHT LOWER EXTREMITY Common Femoral Vein: No evidence of thrombus. Normal compressibility, respiratory phasicity and response to augmentation. Saphenofemoral Junction: No evidence of thrombus. Normal compressibility and flow on color Doppler imaging. Profunda Femoral Vein: No evidence of thrombus. Normal compressibility and flow on color Doppler imaging. Femoral Vein: No  evidence of thrombus. Normal compressibility, respiratory phasicity and response to augmentation. Popliteal Vein: No evidence of thrombus. Normal compressibility, respiratory phasicity and response to augmentation. Calf Veins: No evidence of thrombus. Normal compressibility and flow on color Doppler imaging. Superficial Great Saphenous Vein: No evidence of thrombus. Normal compressibility. Venous Reflux:  None. Other Findings:  None. LEFT LOWER EXTREMITY Common Femoral Vein: No evidence of thrombus. Normal compressibility, respiratory phasicity and response to augmentation. Saphenofemoral Junction: No evidence of thrombus. Normal compressibility and flow on color Doppler imaging. Profunda Femoral Vein: No evidence of thrombus. Normal compressibility and flow on color Doppler imaging. Femoral Vein: No evidence of thrombus. Normal compressibility, respiratory phasicity and response to augmentation. Popliteal Vein: No evidence of thrombus. Normal compressibility, respiratory phasicity and response to augmentation. Calf Veins: No evidence of thrombus. Normal compressibility and flow on color Doppler imaging. Superficial Great Saphenous Vein: No evidence of thrombus. Normal  compressibility. Venous Reflux:  None. Other Findings:  None. IMPRESSION: No evidence of acute or chronic DVT within either lower extremity. Electronically Signed   By: Sandi Mariscal M.D.   On: 11/22/2018 12:00   Dg Chest Portable 1 View  Result Date: 11/21/2018 CLINICAL DATA:  Short of breath, chest pain EXAM: PORTABLE CHEST 1 VIEW COMPARISON:  Radiograph 09/06/2010 FINDINGS: Stable enlarged cardiac silhouette. Low lung volumes. Mild venous congestion. No focal infiltrate. LEFT lung base poorly evaluated. IMPRESSION: 1. Cardiomegaly and mild venous congestion. 2. Low lung volumes. 3. No focal infiltrate. Electronically Signed   By: Suzy Bouchard M.D.   On: 11/21/2018 15:34     ASSESSMENT AND PLAN:   78 year old female with past medical history of obesity, diabetes, COPD, hypertension, GERD, history of previous CVA, history of chronic diastolic CHF, chronic constipation, anxiety/depression who presented to the hospital due to shortness of breath and chest pain.  1.  Acute pulmonary embolism-this is the cause of patient's worsening shortness of breath and chest pain. - Patient CT scan was positive for small pulmonary embolism as mentioned.  Dopplers of her lower extremities are negative for DVT. -Was on heparin drip but will switch to oral Eliquis today.  Wean off oxygen as tolerated.  2.  Diabetes type 2 with-continue sliding scale insulin, follow blood sugars.  3.  Diabetic neuropathy-continue gabapentin.  4.  History of previous CVA-continue Plavix, atorvastatin.  5.  hx of seizures-no acute seizure definitively.  Continue Keppra.  6.  COPD-no acute exacerbation-continue Dulera, Incruse Ellipta, duo nebs, albuterol nebs as needed.  7.  Depression- continue Prozac  Possible d/c back to SNF tomorrow.   All the records are reviewed and case discussed with Care Management/Social Worker. Management plans discussed with the patient, family and they are in agreement.  CODE STATUS: Full  code  DVT Prophylaxis: Eliquis  TOTAL TIME TAKING CARE OF THIS PATIENT: 30 minutes.   POSSIBLE D/C IN 1-2 DAYS, DEPENDING ON CLINICAL CONDITION.   Henreitta Leber M.D on 11/22/2018 at 2:51 PM  Between 7am to 6pm - Pager - [phone]  After 6pm go to [email or web address] - [employer] [facility]  Office  [phone]  CC: Primary care physician; Valerie Roys, DO

## 2018-11-22 NOTE — Progress Notes (Signed)
PT Cancellation Note  Patient Details Name: Katherine [name] MRN: [id number] DOB: 1940/04/27   Cancelled Treatment:    Reason Eval/Treat Not Completed: Medical issues which prohibited therapy(Consult received and chart reviewed.  Patient noted with acute PE, started anticoag (heparin) 10/8, 2100.  Per guidelines, to receive anticoag x48 hours prior to initiation of exertional activity.  Will hold at this time and re-attempt at later time/date as medically appropriate.)   Merrill Villarruel H. Owens Shark, PT, DPT, NCS 11/22/18, 11:04 AM [phone]

## 2018-11-22 NOTE — TOC Initial Note (Signed)
Transition of Care [facility]) - Initial/Assessment Note    Patient Details  Name: Katherine [name] MRN: [id number] Date of Birth: 03/01/1940  [facility]) CM/SW Contact:    Beverly Sessions, RN Phone Number: 11/22/2018, 4:39 PM  Clinical Narrative:                  Patient admitted from [facility] for acute PE RNCM confirmed with Claiborne Billings at [facility] that patient can return  Patient confirms she wishes to return at discharge  PT eval pending  VM left for husband  Expected Discharge Plan: Long Term Nursing Home Barriers to Discharge: Continued Medical Work up   Patient Goals and CMS Choice        Expected Discharge Plan and Services Expected Discharge Plan: [place]       Living arrangements for the past 2 months: [place]                                      Prior Living Arrangements/Services Living arrangements for the past 2 months: [place] Lives with:: Facility Resident Patient language and need for interpreter reviewed:: Yes Do you feel safe going back to the place where you live?: Yes      Need for Family Participation in Patient Care: Yes (Comment) Care giver support system in place?: Yes (comment)   Criminal Activity/Legal Involvement Pertinent to Current Situation/Hospitalization: No - Comment as needed  Activities of Daily Living Home Assistive Devices/Equipment: None ADL Screening (condition at time of admission) Patient's cognitive ability adequate to safely complete daily activities?: Yes Is the patient deaf or have difficulty hearing?: No Does the patient have difficulty seeing, even when wearing glasses/contacts?: No Does the patient have difficulty concentrating, remembering, or making decisions?: No Patient able to express need for assistance with ADLs?: Yes Does the patient have difficulty dressing or bathing?: Yes Independently performs ADLs?:  No Communication: Independent Dressing (OT): Needs assistance Is this a change from baseline?: Pre-admission baseline Grooming: Needs assistance Is this a change from baseline?: Pre-admission baseline Feeding: Independent Bathing: Needs assistance Is this a change from baseline?: Pre-admission baseline Toileting: Needs assistance Is this a change from baseline?: Pre-admission baseline In/Out Bed: Needs assistance Is this a change from baseline?: Pre-admission baseline Walks in Home: Needs assistance Is this a change from baseline?: Pre-admission baseline Does the patient have difficulty walking or climbing stairs?: Yes Weakness of Legs: Both Weakness of Arms/Hands: Both  Permission Sought/Granted                  Emotional Assessment       Orientation: : Oriented to Self, Oriented to Place, Oriented to  Time, Oriented to Situation   Psych Involvement: No (comment)  Admission diagnosis:  Swelling [R60.9] Chronic respiratory failure with hypoxia (HCC) [J96.11] Other acute pulmonary embolism without acute cor pulmonale ([place]) [I26.99] Chest pain, unspecified type [R07.9] Patient Active Problem List   Diagnosis Date Noted  . Pulmonary embolism ([place]) 11/21/2018  . Delirium   . UTI (urinary tract infection) 08/21/2018  . Chest pain 07/15/2018  . Palliative care encounter 05/10/2018  . Localized edema 05/10/2018  . Shortness of breath 05/10/2018  . Hematemesis 04/28/2018  . Influenza A 04/13/2018  . Acute on chronic congestive heart failure ([place])   . COPD with acute exacerbation ([place]) 02/24/2018  . Chronic diastolic  heart failure ([place]) 12/31/2017  . Lymphedema 12/31/2017  . COPD exacerbation ([place]) 11/30/2017  . Urinary tract infection 11/22/2017  . 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[place]) 11/16/2017  . Diabetes mellitus type 2, uncomplicated ([place]) 0000000  . Pressure injury of skin 10/29/2017  . Acute kidney injury superimposed on CKD ([place]) 10/24/2017  .  Severe sepsis ([place]) 10/24/2017  . Possible Seizures ([place]) 10/08/2017  . Hyperlipemia 10/08/2017  . 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 Stroke-like episode ([place]) s/p IV tpa 10/04/2017  . Palliative care by specialist   . Elevated rheumatoid factor 09/05/2017  . Frequent hospital admissions 08/22/2017  . Aphasia 06/28/2017  . Hand pain 03/21/2017  . Dry skin 03/21/2017  . Pain in finger of left hand 09/12/2016  . Chronic fatigue 06/12/2016  . Left knee pain 06/12/2016  . Goals of care, counseling/discussion 03/13/2016  . Primary localized osteoarthritis of right knee 02/08/2016  . OSA (obstructive sleep apnea) 09/16/2015  . Rotator cuff syndrome 09/07/2015  . 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 Sleep disturbance 04/14/2015  . Coronary artery disease 03/14/2015  . 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 Acute respiratory failure with hypoxia ([place]) 09/16/2014  . 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 Anxiety   . Tobacco abuse   . Prurigo nodularis   . GERD (gastroesophageal reflux disease)   . COPD (chronic obstructive pulmonary disease) ([place])   . Osteoarthritis   . Vitamin D deficiency disease   . Chronic constipation   . CKD (chronic kidney disease), stage III   . Essential hypertension 05/20/2013  . Morbid obesity ([place]) 05/20/2013   PCP:  Valerie Roys, DO Pharmacy:   [facility] [phone] [address] [place] [address] Phone: [phone] Fax: [phone]     Social Determinants of Health (SDOH) Interventions    Readmission Risk Interventions Readmission Risk Prevention Plan 11/22/2018 07/16/2018 04/29/2018  Transportation Screening Complete Complete Complete  Medication Review Press photographer) Complete Complete Complete  PCP or Specialist appointment within 3-5 days of discharge - Complete Not Complete  PCP/Specialist Appt Not Complete comments - - Patient to return to SNF,  and they will make appointments   [place] or [place] - Complete (No Data)  SW Recovery Care/Counseling Consult - Not Complete (No Data)  SW Consult Not Complete Comments - na -  Palliative Care Screening - Not Applicable Complete  Medication Reconcilation ([place] Complete Complete Complete  Some recent data might be hidden

## 2018-11-23 LAB — TROPONIN I (HIGH SENSITIVITY)
Troponin I (High Sensitivity): 11 ng/L (ref <18)
Troponin I (High Sensitivity): 11 ng/L (ref <18)

## 2018-11-23 LAB — GLUCOSE, CAPILLARY
Glucose-Capillary: 85 mg/dL (ref 70–99)
Glucose-Capillary: 93 mg/dL (ref 70–99)
Glucose-Capillary: 97 mg/dL (ref 70–99)
Glucose-Capillary: 105 mg/dL — ABNORMAL HIGH (ref 70–99)

## 2018-11-23 LAB — CBC
HCT: 27.2 % — ABNORMAL LOW (ref 36.0–46.0)
Hemoglobin: 8.6 g/dL — ABNORMAL LOW (ref 12.0–15.0)
MCH: 26.5 pg (ref 26.0–34.0)
MCHC: 31.6 g/dL (ref 30.0–36.0)
MCV: 84 fL (ref 80.0–100.0)
Platelets: 323 10*3/uL (ref 150–400)
RBC: 3.24 MIL/uL — ABNORMAL LOW (ref 3.87–5.11)
RDW: 19.4 % — ABNORMAL HIGH (ref 11.5–15.5)
WBC: 5.2 10*3/uL (ref 4.0–10.5)
nRBC: 0 % (ref 0.0–0.2)

## 2018-11-23 MED ORDER — OXYCODONE-ACETAMINOPHEN 5-325 MG PO TABS
1.0000 | ORAL_TABLET | Freq: Once | ORAL | Status: AC
  Administered 2018-11-23: 1 via ORAL
  Filled 2018-11-23: qty 1

## 2018-11-23 NOTE — Progress Notes (Signed)
[place] [facility] [place] NAME: Katherine [name]    MR#:  [id number]  DATE OF BIRTH:  11-16-1940  SUBJECTIVE:   Patient presented to the hospital due to shortness of breath and chest pain.  Patient CT scan was positive for small pulmonary embolism and patient being treated with anticoagulation. This morning patient complained of right-sided jaw pain.  Patient does not use dentures.  Given atypical presentation twelve-lead EKG and cardiac enzymes were requested   REVIEW OF SYSTEMS:    Review of Systems  Constitutional: Negative for chills and fever.  HENT: Negative for congestion and tinnitus.        Right-sided jaw pain  Eyes: Negative for blurred vision and double vision.  Respiratory: Negative for cough, shortness of breath and wheezing.   Cardiovascular: Negative for chest pain, orthopnea and PND.  Gastrointestinal: Negative for abdominal pain, diarrhea, nausea and vomiting.  Genitourinary: Negative for dysuria and hematuria.  Neurological: Negative for dizziness, sensory change and focal weakness.  All other systems reviewed and are negative.   Nutrition: Heart Healthy/Carb control Tolerating Diet: Yes Tolerating PT: bedbound at baseline.   DRUG ALLERGIES:   Allergies  Allergen Reactions   Bee Venom Swelling   Enalapril Maleate Swelling    VITALS:  Blood pressure (!) 93/49, pulse 99, temperature 98.6 F (37 C), temperature source Oral, resp. rate 16, height 5\' 8"  (1.727 m), weight 104 kg, SpO2 97 %.  PHYSICAL EXAMINATION:   Physical Exam  GENERAL:  78 y.o.-year-old obese patient lying in bed in no acute distress.  EYES: Pupils equal, round, reactive to light and accommodation. No scleral icterus. Extraocular muscles intact.  HEENT: Head atraumatic, normocephalic. Oropharynx and nasopharynx clear.  NECK:  Supple, no jugular venous distention. No thyroid enlargement, no tenderness.  LUNGS: Normal breath sounds bilaterally,  no wheezing, rales, rhonchi. No use of accessory muscles of respiration.  CARDIOVASCULAR: S1, S2 normal. No murmurs, rubs, or gallops.  ABDOMEN: Soft, nontender, nondistended. Bowel sounds present. No organomegaly or mass.  EXTREMITIES: No cyanosis, clubbing, + 1-2 edema b/l  NEUROLOGIC: Cranial nerves II through XII are intact. No focal Motor or sensory deficits b/l. Globally weak.   PSYCHIATRIC: The patient is alert and oriented x 3.  SKIN: No obvious rash, lesion, or ulcer.    LABORATORY PANEL:   CBC Recent Labs  Lab 11/23/18 0714  WBC 5.2  HGB 8.6*  HCT 27.2*  PLT 323   ------------------------------------------------------------------------------------------------------------------  Chemistries  Recent Labs  Lab 11/21/18 1443 11/22/18 0510  NA 135 137  K 4.3 4.1  CL 101 104  CO2 27 25  GLUCOSE 89 111*  BUN 18 19  CREATININE 1.35* 1.41*  CALCIUM 9.5 8.8*  AST 101*  --   ALT 45*  --   ALKPHOS 233*  --   BILITOT 0.6  --    ------------------------------------------------------------------------------------------------------------------  Cardiac Enzymes No results for input(s): TROPONINI in the last 168 hours. ------------------------------------------------------------------------------------------------------------------  RADIOLOGY:  Ct Angio Chest Pe W And/or Wo Contrast  Result Date: 11/21/2018 CLINICAL DATA:  Chest pain, shortness of breath and diffuse, acute abdominal pain. EXAM: CT ANGIOGRAPHY CHEST CT ABDOMEN AND PELVIS WITH CONTRAST TECHNIQUE: Multidetector CT imaging of the chest was performed using the standard protocol during bolus administration of intravenous contrast. Multiplanar CT image reconstructions and MIPs were obtained to evaluate the vascular anatomy. Multidetector CT imaging of the abdomen and pelvis was performed using the standard protocol during bolus administration of intravenous contrast.  CONTRAST:  49mL OMNIPAQUE IOHEXOL 350 MG/ML SOLN  COMPARISON:  Portable chest obtained earlier today. Chest CT dated 07/15/2018. Abdomen and pelvis CT dated 05/04/2017. FINDINGS: CTA CHEST FINDINGS Cardiovascular: Enlarged central pulmonary arteries. The main pulmonary artery measures 3.6 cm in diameter. The pulmonary arteries are normally opacified with a very small filling defect noted in a left lower lobe pulmonary artery branch on image number 136 series 3, 86 series 7 and 112 series 8. There is a smaller filling defect more inferiorly in a branch of that pulmonary artery on image number 151 series 3. No other pulmonary arterial filling defects are seen. No evidence of right ventricular strain. Minimal pericardial effusion with a maximum thickness of 15 mm anteriorly and inferiorly. Dense atheromatous calcifications, including extensive coronary artery calcifications. Mediastinum/Nodes: No enlarged mediastinal, hilar, or axillary lymph nodes. Thyroid gland, trachea, and esophagus demonstrate no significant findings. Lungs/Pleura: Mild bibasilar subsegmental atelectasis. Minimal bilateral pleural fluid. Musculoskeletal: Thoracic and lower cervical spine degenerative changes. Review of the MIP images confirms the above findings. CT ABDOMEN and PELVIS FINDINGS Hepatobiliary: No focal liver abnormality is seen. Status post cholecystectomy. No biliary dilatation. Pancreas: Unremarkable. No pancreatic ductal dilatation or surrounding inflammatory changes. Spleen: Normal in size without focal abnormality. Adrenals/Urinary Tract: The previously demonstrated 1.8 cm left adrenal mass containing a coarse curvilinear calcification currently measures 1.6 x 1.3 cm on image number 23 series 4. Stable normal appearing right adrenal gland period. Stable small left renal cyst. The left kidney remains somewhat small. Both kidneys have small fetal lobulations. Tiny calculus in the inferior right kidney. There are also 2 tiny right renal cysts. Unremarkable ureters and urinary  bladder. Stomach/Bowel: Stomach is within normal limits. Appendix appears normal. No evidence of bowel wall thickening, distention, or inflammatory changes. Vascular/Lymphatic: Extensive atheromatous arterial calcifications. The previously demonstrated 4.3 cm infrarenal abdominal aortic aneurysm currently measures 4.5 cm in maximum diameter on image number 39 series 4. This is not involving the iliac arteries. Reproductive: Uterus and bilateral adnexa are unremarkable. Other: None. Musculoskeletal: Lower thoracic spine degenerative changes and mild lumbar spine degenerative changes. Review of the MIP images confirms the above findings. IMPRESSION: 1. Two small left lower lobe pulmonary emboli. 2. Mild bibasilar subsegmental atelectasis. 3. Minimal bilateral pleural fluid. 4. Minimal pericardial effusion. 5. Extensive calcified coronary artery atherosclerosis and aortic atherosclerosis. 6. Interval mild increase in size of the previously demonstrated 4.3 cm infrarenal abdominal aortic aneurysm, currently measuring 4.5 cm in maximum diameter. 7. Tiny, nonobstructing right renal calculus. Critical Value/emergent results were called by telephone at the time of interpretation on 11/21/2018 at 6:58 pm to providerPHILLIP STAFFORD , who verbally acknowledged these results. Electronically Signed   By: Claudie Revering M.D.   On: 11/21/2018 19:05   Ct Abdomen Pelvis W Contrast  Result Date: 11/21/2018 CLINICAL DATA:  Chest pain, shortness of breath and diffuse, acute abdominal pain. EXAM: CT ANGIOGRAPHY CHEST CT ABDOMEN AND PELVIS WITH CONTRAST TECHNIQUE: Multidetector CT imaging of the chest was performed using the standard protocol during bolus administration of intravenous contrast. Multiplanar CT image reconstructions and MIPs were obtained to evaluate the vascular anatomy. Multidetector CT imaging of the abdomen and pelvis was performed using the standard protocol during bolus administration of intravenous contrast.  CONTRAST:  65mL OMNIPAQUE IOHEXOL 350 MG/ML SOLN COMPARISON:  Portable chest obtained earlier today. Chest CT dated 07/15/2018. Abdomen and pelvis CT dated 05/04/2017. FINDINGS: CTA CHEST FINDINGS Cardiovascular: Enlarged central pulmonary arteries. The main pulmonary artery measures 3.6 cm in diameter. The  pulmonary arteries are normally opacified with a very small filling defect noted in a left lower lobe pulmonary artery branch on image number 136 series 3, 86 series 7 and 112 series 8. There is a smaller filling defect more inferiorly in a branch of that pulmonary artery on image number 151 series 3. No other pulmonary arterial filling defects are seen. No evidence of right ventricular strain. Minimal pericardial effusion with a maximum thickness of 15 mm anteriorly and inferiorly. Dense atheromatous calcifications, including extensive coronary artery calcifications. Mediastinum/Nodes: No enlarged mediastinal, hilar, or axillary lymph nodes. Thyroid gland, trachea, and esophagus demonstrate no significant findings. Lungs/Pleura: Mild bibasilar subsegmental atelectasis. Minimal bilateral pleural fluid. Musculoskeletal: Thoracic and lower cervical spine degenerative changes. Review of the MIP images confirms the above findings. CT ABDOMEN and PELVIS FINDINGS Hepatobiliary: No focal liver abnormality is seen. Status post cholecystectomy. No biliary dilatation. Pancreas: Unremarkable. No pancreatic ductal dilatation or surrounding inflammatory changes. Spleen: Normal in size without focal abnormality. Adrenals/Urinary Tract: The previously demonstrated 1.8 cm left adrenal mass containing a coarse curvilinear calcification currently measures 1.6 x 1.3 cm on image number 23 series 4. Stable normal appearing right adrenal gland period. Stable small left renal cyst. The left kidney remains somewhat small. Both kidneys have small fetal lobulations. Tiny calculus in the inferior right kidney. There are also 2 tiny right  renal cysts. Unremarkable ureters and urinary bladder. Stomach/Bowel: Stomach is within normal limits. Appendix appears normal. No evidence of bowel wall thickening, distention, or inflammatory changes. Vascular/Lymphatic: Extensive atheromatous arterial calcifications. The previously demonstrated 4.3 cm infrarenal abdominal aortic aneurysm currently measures 4.5 cm in maximum diameter on image number 39 series 4. This is not involving the iliac arteries. Reproductive: Uterus and bilateral adnexa are unremarkable. Other: None. Musculoskeletal: Lower thoracic spine degenerative changes and mild lumbar spine degenerative changes. Review of the MIP images confirms the above findings. IMPRESSION: 1. Two small left lower lobe pulmonary emboli. 2. Mild bibasilar subsegmental atelectasis. 3. Minimal bilateral pleural fluid. 4. Minimal pericardial effusion. 5. Extensive calcified coronary artery atherosclerosis and aortic atherosclerosis. 6. Interval mild increase in size of the previously demonstrated 4.3 cm infrarenal abdominal aortic aneurysm, currently measuring 4.5 cm in maximum diameter. 7. Tiny, nonobstructing right renal calculus. Critical Value/emergent results were called by telephone at the time of interpretation on 11/21/2018 at 6:58 pm to providerPHILLIP STAFFORD , who verbally acknowledged these results. Electronically Signed   By: Claudie Revering M.D.   On: 11/21/2018 19:05   US Venous Img Lower Bilateral  Result Date: 11/22/2018 CLINICAL DATA:  Bilateral lower extremity pain and edema. History of previous DVT and pulmonary embolism. Former smoker. Patient is currently on anticoagulation. Evaluate for acute or chronic DVT. EXAM: BILATERAL LOWER EXTREMITY VENOUS DOPPLER ULTRASOUND TECHNIQUE: Gray-scale sonography with graded compression, as well as color Doppler and duplex ultrasound were performed to evaluate the lower extremity deep venous systems from the level of the common femoral vein and including the  common femoral, femoral, profunda femoral, popliteal and calf veins including the posterior tibial, peroneal and gastrocnemius veins when visible. The superficial great saphenous vein was also interrogated. Spectral Doppler was utilized to evaluate flow at rest and with distal augmentation maneuvers in the common femoral, femoral and popliteal veins. COMPARISON:  Bilateral lower extremity venous Doppler ultrasound-07/15/2018 FINDINGS: RIGHT LOWER EXTREMITY Common Femoral Vein: No evidence of thrombus. Normal compressibility, respiratory phasicity and response to augmentation. Saphenofemoral Junction: No evidence of thrombus. Normal compressibility and flow on color Doppler imaging. Profunda Femoral Vein:  No evidence of thrombus. Normal compressibility and flow on color Doppler imaging. Femoral Vein: No evidence of thrombus. Normal compressibility, respiratory phasicity and response to augmentation. Popliteal Vein: No evidence of thrombus. Normal compressibility, respiratory phasicity and response to augmentation. Calf Veins: No evidence of thrombus. Normal compressibility and flow on color Doppler imaging. Superficial Great Saphenous Vein: No evidence of thrombus. Normal compressibility. Venous Reflux:  None. Other Findings:  None. LEFT LOWER EXTREMITY Common Femoral Vein: No evidence of thrombus. Normal compressibility, respiratory phasicity and response to augmentation. Saphenofemoral Junction: No evidence of thrombus. Normal compressibility and flow on color Doppler imaging. Profunda Femoral Vein: No evidence of thrombus. Normal compressibility and flow on color Doppler imaging. Femoral Vein: No evidence of thrombus. Normal compressibility, respiratory phasicity and response to augmentation. Popliteal Vein: No evidence of thrombus. Normal compressibility, respiratory phasicity and response to augmentation. Calf Veins: No evidence of thrombus. Normal compressibility and flow on color Doppler imaging. Superficial  Great Saphenous Vein: No evidence of thrombus. Normal compressibility. Venous Reflux:  None. Other Findings:  None. IMPRESSION: No evidence of acute or chronic DVT within either lower extremity. Electronically Signed   By: Sandi Mariscal M.D.   On: 11/22/2018 12:00   Dg Chest Portable 1 View  Result Date: 11/21/2018 CLINICAL DATA:  Short of breath, chest pain EXAM: PORTABLE CHEST 1 VIEW COMPARISON:  Radiograph 09/06/2010 FINDINGS: Stable enlarged cardiac silhouette. Low lung volumes. Mild venous congestion. No focal infiltrate. LEFT lung base poorly evaluated. IMPRESSION: 1. Cardiomegaly and mild venous congestion. 2. Low lung volumes. 3. No focal infiltrate. Electronically Signed   By: Suzy Bouchard M.D.   On: 11/21/2018 15:34     ASSESSMENT AND PLAN:   78 year old female with past medical history of obesity, diabetes, COPD, hypertension, GERD, history of previous CVA, history of chronic diastolic CHF, chronic constipation, anxiety/depression who presented to the hospital due to shortness of breath and chest pain.  1.  Acute pulmonary embolism-this is the cause of patient's worsening shortness of breath and chest pain. - Patient CT scan was positive for small pulmonary embolism as mentioned.  Dopplers of her lower extremities are negative for DVT. Patient was initially on heparin drip which has been transitioned to Eliquis.  Patient currently on 2 L of oxygen.  Patient states she is on home oxygen therapy as well.  2.  Diabetes type 2 with-continue sliding scale insulin, follow blood sugars.  3.  Diabetic neuropathy-continue gabapentin.  4.  History of previous CVA-continue Plavix, atorvastatin.  5.  hx of seizures-no acute seizure definitively.  Continue Keppra.  6.  COPD-no acute exacerbation-continue Dulera, Incruse Ellipta, duo nebs, albuterol nebs as needed.  7.  Depression- continue Prozac  8.  Right-sided jaw pain. Patient apparently does not use dentures.  I have changed her  diet from regular consistency to soft diet. Given atypical presentation and risk factors, stat twelve-lead EKG done with sinus tachycardia with rate of 103.  Following observer cardiac enzymes to rule out acute coronary syndrome. Given a dose of Percocet for pain control.  Avoided morphine due to blood pressure being borderline low.  Possible d/c back to SNF possibly tomorrow.   All the records are reviewed and case discussed with Care Management/Social Worker. Management plans discussed with the patient, family and they are in agreement.  CODE STATUS: Full code  DVT Prophylaxis: Eliquis  TOTAL TIME TAKING CARE OF THIS PATIENT: 32 minutes.   POSSIBLE D/C IN 1-2 DAYS, DEPENDING ON CLINICAL CONDITION.   Naithen Rivenburg Kerr.D  on 11/23/2018 at 2:43 PM  Between 7am to 6pm - Pager - [phone]  After 6pm go to [email or web address] - [employer] [facility]  Office  [phone]  CC: Primary care physician; Valerie Roys, DO

## 2018-11-24 ENCOUNTER — Inpatient Hospital Stay: Payer: Medicare HMO

## 2018-11-24 LAB — GLUCOSE, CAPILLARY
Glucose-Capillary: 88 mg/dL (ref 70–99)
Glucose-Capillary: 97 mg/dL (ref 70–99)
Glucose-Capillary: 106 mg/dL — ABNORMAL HIGH (ref 70–99)
Glucose-Capillary: 127 mg/dL — ABNORMAL HIGH (ref 70–99)

## 2018-11-24 NOTE — Progress Notes (Addendum)
Minor nose bleed that stopped easily. Dr Stark Jock notified. No new orders. Hydralazine scheduled for now. Dr Stark Jock said to hold am dose due to low BP

## 2018-11-24 NOTE — Progress Notes (Signed)
[place] at [place] NAME: Katherine [name]    MR#:  [id number]  DATE OF BIRTH:  1941/01/11  SUBJECTIVE:   Patient presented to the hospital due to shortness of breath and chest pain.  Patient CT scan was positive for small pulmonary embolism and patient being treated with anticoagulation. This morning patient complained of right-sided jaw pain.  Patient does not use dentures.  Given atypical presentation twelve-lead EKG and cardiac enzymes were requested   REVIEW OF SYSTEMS:    Review of Systems  Constitutional: Negative for chills and fever.  HENT: Negative for congestion and tinnitus.   Eyes: Negative for blurred vision and double vision.  Respiratory: Negative for cough, shortness of breath and wheezing.   Cardiovascular: Negative for chest pain, orthopnea and PND.  Gastrointestinal: Negative for abdominal pain, diarrhea, nausea and vomiting.  Genitourinary: Negative for dysuria and hematuria.  Neurological: Negative for dizziness, sensory change and focal weakness.  All other systems reviewed and are negative.   Nutrition: Heart Healthy/Carb control Tolerating Diet: Yes Tolerating PT: bedbound at baseline.   DRUG ALLERGIES:   Allergies  Allergen Reactions  . Bee Venom Swelling  . Enalapril Maleate Swelling    VITALS:  Blood pressure 120/71, pulse 98, temperature 98.2 F (36.8 C), temperature source Oral, resp. rate 20, height 5\' 8"  (1.727 m), weight 104 kg, SpO2 98 %.  PHYSICAL EXAMINATION:   Physical Exam  GENERAL:  78 y.o.-year-old obese patient lying in bed in no acute distress.  EYES: Pupils equal, round, reactive to light and accommodation. No scleral icterus. Extraocular muscles intact.  HEENT: Head atraumatic, normocephalic. Oropharynx and nasopharynx clear.  NECK:  Supple, no jugular venous distention. No thyroid enlargement, no tenderness.  LUNGS: Normal breath sounds bilaterally, no wheezing, rales, rhonchi.  No use of accessory muscles of respiration.  CARDIOVASCULAR: S1, S2 normal. No murmurs, rubs, or gallops.  ABDOMEN: Soft, nontender, nondistended. Bowel sounds present. No organomegaly or mass.  EXTREMITIES: No cyanosis, clubbing, + 1-2 edema b/l  NEUROLOGIC: Cranial nerves II through XII are intact. No focal Motor or sensory deficits b/l. Globally weak.   PSYCHIATRIC: The patient is alert and oriented x 3.  SKIN: No obvious rash, lesion, or ulcer.    LABORATORY PANEL:   CBC Recent Labs  Lab 11/23/18 0714  WBC 5.2  HGB 8.6*  HCT 27.2*  PLT 323   ------------------------------------------------------------------------------------------------------------------  Chemistries  Recent Labs  Lab 11/21/18 1443 11/22/18 0510  NA 135 137  K 4.3 4.1  CL 101 104  CO2 27 25  GLUCOSE 89 111*  BUN 18 19  CREATININE 1.35* 1.41*  CALCIUM 9.5 8.8*  AST 101*  --   ALT 45*  --   ALKPHOS 233*  --   BILITOT 0.6  --    ------------------------------------------------------------------------------------------------------------------  Cardiac Enzymes No results for input(s): TROPONINI in the last 168 hours. ------------------------------------------------------------------------------------------------------------------  RADIOLOGY:  No results found.   ASSESSMENT AND PLAN:   78 year old female with past medical history of obesity, diabetes, COPD, hypertension, GERD, history of previous CVA, history of chronic diastolic CHF, chronic constipation, anxiety/depression who presented to the hospital due to shortness of breath and chest pain.  1.  Acute pulmonary embolism-this is the cause of patient's worsening shortness of breath and chest pain. - Patient CT scan was positive for small pulmonary embolism as mentioned.  Dopplers of her lower extremities are negative for DVT. Patient was initially on heparin drip which has been transitioned  to Eliquis.  Patient currently on 2 L of oxygen.   Patient states she is on home oxygen therapy as well. Patient reported to have had some mild nosebleed this morning which is resolved.  This is likely due to recently initiated anticoagulation with Eliquis.  Will monitor and repeat CBC in a.m. to ensure stability before discharge.  2.  Diabetes type 2 with-continue sliding scale insulin, follow blood sugars.  3.  Diabetic neuropathy-continue gabapentin.  4.  History of previous CVA-continue Plavix, atorvastatin.  5.  hx of seizures-no acute seizure definitively.  Continue Keppra.  6.  COPD-no acute exacerbation-continue Dulera, Incruse Ellipta, duo nebs, albuterol nebs as needed.  7.  Depression- continue Prozac  8.  Right-sided jaw pain.;  Resolved Patient apparently does not use dentures which is likely etiology.  I have changed her diet from regular consistency to soft diet. Given atypical presentation and risk factors, stat twelve-lead EKG done with sinus tachycardia with rate of 103.  Acute coronary syndrome ruled out.   Possible d/c back to SNF tomorrow.   All the records are reviewed and case discussed with Care Management/Social Worker. Management plans discussed with the patient, family and they are in agreement.  CODE STATUS: Full code  DVT Prophylaxis: Eliquis  TOTAL TIME TAKING CARE OF THIS PATIENT: 30 minutes.   POSSIBLE D/C IN 1-2 DAYS, DEPENDING ON CLINICAL CONDITION.   Kaid Seeberger M.D on 11/24/2018 at 1:44 PM  Between 7am to 6pm - Pager - [phone]  After 6pm go to [email or web address] - [employer] [facility]  Office  [phone]  CC: Primary care physician; Valerie Roys, DO

## 2018-11-24 NOTE — Progress Notes (Signed)
Physical Therapy Evaluation Patient Details Name: Katherine [name] MRN: [id number] DOB: [birth year] Today's Date: 11/24/2018   History of Present Illness  Katherine [name]  is a 78 y.o. female with a known history of congestive heart failure, chronic kidney disease stage III, COPD, diabetes type 2, GERD, hypertension, morbid obesity, history of seizures, history of previous stroke who resides in a  healthcare who is presenting with chest pain or shortness of breath.  Clinical Impression  Patient reports that she lives at a nursing home and has not been able to roll over, sit up at the edge of bed, transfer or walk in over a year. She reports no pain. She has 1/5 BLE hip strength and 2/5 left ankle and -3/5 right ankle strength. She is dependent for rolling left and right and all mobility. Patient would need a lift to transfer from bed to chair. She is at baseline level of her functional status which has been dependent for the last year. She has weakness in BLE and trunk that limits her mobility. She does not have skilled PT needs and PT is not recommended.      Follow Up Recommendations (long term nursing home)    Equipment Recommendations       Recommendations for Other Services       Precautions / Restrictions Precautions Precautions: None Restrictions Weight Bearing Restrictions: No      Mobility  Bed Mobility Overal bed mobility: Needs Assistance Bed Mobility: Rolling;Supine to Sit;Sit to Supine Rolling: Total assist   Supine to sit: Total assist Sit to supine: Total assist   General bed mobility comments: Patient can reach for the bed rail and try to roll over but unable to move left or right rolling, she is total assist for rolling left and right .  Transfers Overall transfer level: Needs assistance               General transfer comment: Patient is dependent for transfers  Ambulation/Gait                Stairs            Wheelchair Mobility    Modified Rankin (Stroke Patients Only)       Balance                                             Pertinent Vitals/Pain Pain Assessment: No/denies pain    Home Living Family/patient expects to be discharged to:: Skilled nursing facility                 Additional Comments: Patient reports that she has been bed bound for a year, does not sit at the edge of bed, roll over , stand or walk or sit in her wc.     Prior Function Level of Independence: Needs assistance   Gait / Transfers Assistance Needed: (non ambulatory, dependent with bed mobiltiy and ADLS, )  ADL's / Homemaking Assistance Needed: (patient lives at nursing home, )  Comments: Patient reports that she is dependent for all mobility     Hand Dominance        Extremity/Trunk Assessment   Upper Extremity Assessment Upper Extremity Assessment: Overall WFL for tasks assessed    Lower Extremity Assessment Lower Extremity Assessment: Generalized weakness;RLE deficits/detail;LLE deficits/detail RLE Deficits / Details: (hip flex 1/5, hip abd / add 1/5, ankle DF -3/5) LLE  Deficits / Details: (hip flex 1/5, hip abd/add 1/5, ankle 2/ 5 DF)       Communication   Communication: No difficulties  Cognition Arousal/Alertness: Awake/alert Behavior During Therapy: WFL for tasks assessed/performed Overall Cognitive Status: Within Functional Limits for tasks assessed                                        General Comments      Exercises     Assessment/Plan    PT Assessment Patent does not need any further PT services  PT Problem List         PT Treatment Interventions      PT Goals (Current goals can be found in the Care Plan section)  Acute Rehab PT Goals Patient Stated Goal: no goals stanted PT Goal Formulation: All assessment and education complete, DC therapy    Frequency     Barriers to discharge        Co-evaluation               AM-PAC PT "6  Clicks" Mobility  Outcome Measure Help needed turning from your back to your side while in a flat bed without using bedrails?: Total Help needed moving from lying on your back to sitting on the side of a flat bed without using bedrails?: Total Help needed moving to and from a bed to a chair (including a wheelchair)?: Total Help needed standing up from a chair using your arms (e.g., wheelchair or bedside chair)?: Total Help needed to walk in hospital room?: Total Help needed climbing 3-5 steps with a railing? : Total 6 Click Score: 6    End of Session     Patient left: in bed;with bed alarm set   PT Visit Diagnosis: Muscle weakness (generalized) (M62.81)    Time: 1300-1315 PT Time Calculation (min) (ACUTE ONLY): 15 min   Charges:   PT Evaluation $PT Eval Low Complexity: 1 Low            [place], Sherryl Barters, PT DPT 11/24/2018, 1:54 PM

## 2018-11-25 LAB — CBC
HCT: 25.3 % — ABNORMAL LOW (ref 36.0–46.0)
Hemoglobin: 8.2 g/dL — ABNORMAL LOW (ref 12.0–15.0)
MCH: 26.4 pg (ref 26.0–34.0)
MCHC: 32.4 g/dL (ref 30.0–36.0)
MCV: 81.4 fL (ref 80.0–100.0)
Platelets: 314 10*3/uL (ref 150–400)
RBC: 3.11 MIL/uL — ABNORMAL LOW (ref 3.87–5.11)
RDW: 19.1 % — ABNORMAL HIGH (ref 11.5–15.5)
WBC: 4.7 10*3/uL (ref 4.0–10.5)
nRBC: 0 % (ref 0.0–0.2)

## 2018-11-25 LAB — GLUCOSE, CAPILLARY
Glucose-Capillary: 85 mg/dL (ref 70–99)
Glucose-Capillary: 143 mg/dL — ABNORMAL HIGH (ref 70–99)
Glucose-Capillary: 108 mg/dL — ABNORMAL HIGH (ref 70–99)
Glucose-Capillary: 103 mg/dL — ABNORMAL HIGH (ref 70–99)

## 2018-11-25 LAB — NOVEL CORONAVIRUS, NAA (HOSP ORDER, SEND-OUT TO REF LAB; TAT 18-24 HRS): SARS-CoV-2, NAA: NOT DETECTED

## 2018-11-25 MED ORDER — ALUM & MAG HYDROXIDE-SIMETH 200-200-20 MG/5ML PO SUSP
30.0000 mL | Freq: Four times a day (QID) | ORAL | Status: DC | PRN
  Administered 2018-11-25: 30 mL via ORAL
  Filled 2018-11-25: qty 30

## 2018-11-25 NOTE — Progress Notes (Signed)
[facility] [place] NAME: Katherine [name]    MR#:  [id number]  DATE OF BIRTH:  [birth year]  SUBJECTIVE:   Patient reported to have had some nosebleed last night which is resolved..  Plavix discontinued this morning. Awaiting repeat Covid test before patient can return back to nursing home.  REVIEW OF SYSTEMS:    Review of Systems  Constitutional: Negative for chills and fever.  HENT: Negative for congestion and tinnitus.   Eyes: Negative for blurred vision and double vision.  Respiratory: Negative for cough, shortness of breath and wheezing.   Cardiovascular: Negative for chest pain, orthopnea and PND.  Gastrointestinal: Negative for abdominal pain, diarrhea, nausea and vomiting.  Genitourinary: Negative for dysuria and hematuria.  Neurological: Negative for dizziness, sensory change and focal weakness.  All other systems reviewed and are negative.   Nutrition: Heart Healthy/Carb control Tolerating Diet: Yes Tolerating PT: bedbound at baseline.   DRUG ALLERGIES:   Allergies  Allergen Reactions  . Bee Venom Swelling  . Enalapril Maleate Swelling    VITALS:  Blood pressure 114/73, pulse 93, temperature (!) 97.4 F (36.3 C), temperature source Oral, resp. rate 20, height 5\' 8"  (1.727 m), weight 104 kg, SpO2 99 %.  PHYSICAL EXAMINATION:   Physical Exam  GENERAL:  78 y.o.-year-old obese patient lying in bed in no acute distress.  EYES: Pupils equal, round, reactive to light and accommodation. No scleral icterus. Extraocular muscles intact.  HEENT: Head atraumatic, normocephalic. Oropharynx and nasopharynx clear.  NECK:  Supple, no jugular venous distention. No thyroid enlargement, no tenderness.  LUNGS: Normal breath sounds bilaterally, no wheezing, rales, rhonchi. No use of accessory muscles of respiration.  CARDIOVASCULAR: S1, S2 normal. No murmurs, rubs, or gallops.  ABDOMEN: Soft, nontender, nondistended. Bowel sounds  present. No organomegaly or mass.  EXTREMITIES: No cyanosis, clubbing, + 1-2 edema b/l  NEUROLOGIC: Cranial nerves II through XII are intact. No focal Motor or sensory deficits b/l. Globally weak.   PSYCHIATRIC: The patient is alert and oriented x 3.  SKIN: No obvious rash, lesion, or ulcer.    LABORATORY PANEL:   CBC Recent Labs  Lab 11/25/18 0716  WBC 4.7  HGB 8.2*  HCT 25.3*  PLT 314   ------------------------------------------------------------------------------------------------------------------  Chemistries  Recent Labs  Lab 11/21/18 1443 11/22/18 0510  NA 135 137  K 4.3 4.1  CL 101 104  CO2 27 25  GLUCOSE 89 111*  BUN 18 19  CREATININE 1.35* 1.41*  CALCIUM 9.5 8.8*  AST 101*  --   ALT 45*  --   ALKPHOS 233*  --   BILITOT 0.6  --    ------------------------------------------------------------------------------------------------------------------  Cardiac Enzymes No results for input(s): TROPONINI in the last 168 hours. ------------------------------------------------------------------------------------------------------------------  RADIOLOGY:  Dg Chest 1 View  Result Date: 11/24/2018 CLINICAL DATA:  Hemoptysis. Ex-smoker. CHF. EXAM: CHEST  1 VIEW COMPARISON:  CT of 11/21/2018. Most recent chest radiograph 11/21/2018. FINDINGS: Midline trachea. Moderate cardiomegaly. Atherosclerosis in the transverse aorta. No pleural effusion or pneumothorax. Apical lordotic positioning. Diffuse peribronchial thickening. No lobar consolidation. Left lung base scarring. IMPRESSION: 1. No acute cardiopulmonary disease. 2. Cardiomegaly with chronic interstitial thickening. 3.  Aortic Atherosclerosis (ICD10-I70.0). Electronically Signed   By: Abigail Miyamoto M.D.   On: 11/24/2018 20:56     ASSESSMENT AND PLAN:   78 year old female with past medical history of obesity, diabetes, COPD, hypertension, GERD, history of previous CVA, history of chronic diastolic CHF, chronic  constipation, anxiety/depression  who presented to the hospital due to shortness of breath and chest pain.  1.  Acute pulmonary embolism-this is the cause of patient's worsening shortness of breath and chest pain. - Patient CT scan was positive for small pulmonary embolism as mentioned.  Dopplers of her lower extremities are negative for DVT. Patient was initially on heparin drip which has been transitioned to Eliquis.  Patient currently on 2 L of oxygen.  Patient states she is on home oxygen therapy as well. Patient reported to have had some mild nosebleed last night.  No bleeding this morning.  Plavix discontinued due to patient being started on anticoagulation with Eliquis recently.  CBC fairly stable. Awaiting repeat Covid test before patient can return back to nursing home.  Anticipate discharge possibly tomorrow once results available.  2.  Diabetes type 2 with-continue sliding scale insulin, follow blood sugars.  3.  Diabetic neuropathy-continue gabapentin.  4.  History of previous CVA-continue Plavix, atorvastatin.  5.  hx of seizures-no acute seizure definitively.  Continue Keppra.  6.  COPD-no acute exacerbation-continue Dulera, Incruse Ellipta, duo nebs, albuterol nebs as needed.  7.  Depression- continue Prozac  8.  Right-sided jaw pain.;  Resolved Patient apparently does not use dentures which is likely etiology.  I have changed her diet from regular consistency to soft diet. Given atypical presentation and risk factors, stat twelve-lead EKG done with sinus tachycardia with rate of 103.  Acute coronary syndrome ruled out.   Possible d/c back to SNF tomorrow.   All the records are reviewed and case discussed with Care Management/Social Worker. Management plans discussed with the patient, and she is in agreement.  CODE STATUS: Full code  DVT Prophylaxis: Eliquis  TOTAL TIME TAKING CARE OF THIS PATIENT: 31 minutes.   POSSIBLE D/C IN 1-2 DAYS, DEPENDING ON CLINICAL  CONDITION.   Katherine [name] M.D on 11/25/2018 at 3:48 PM  Between 7am to 6pm - Pager - [phone]  After 6pm go to [email or web address] - [employer] [facility]  Office  [phone]  CC: Primary care physician; Valerie Roys, DO

## 2018-11-26 DIAGNOSIS — Z23 Encounter for immunization: Secondary | ICD-10-CM | POA: Diagnosis not present

## 2018-11-26 LAB — CBC
HCT: 25.7 % — ABNORMAL LOW (ref 36.0–46.0)
Hemoglobin: 8.2 g/dL — ABNORMAL LOW (ref 12.0–15.0)
MCH: 25.9 pg — ABNORMAL LOW (ref 26.0–34.0)
MCHC: 31.9 g/dL (ref 30.0–36.0)
MCV: 81.1 fL (ref 80.0–100.0)
Platelets: 318 10*3/uL (ref 150–400)
RBC: 3.17 MIL/uL — ABNORMAL LOW (ref 3.87–5.11)
RDW: 19.3 % — ABNORMAL HIGH (ref 11.5–15.5)
WBC: 4.7 10*3/uL (ref 4.0–10.5)
nRBC: 0 % (ref 0.0–0.2)

## 2018-11-26 LAB — GLUCOSE, CAPILLARY: Glucose-Capillary: 78 mg/dL (ref 70–99)

## 2018-11-26 MED ORDER — ALBUTEROL SULFATE (2.5 MG/3ML) 0.083% IN NEBU: 3.0000 mL | INHALATION_SOLUTION | RESPIRATORY_TRACT | Status: DC | PRN

## 2018-11-26 MED ORDER — APIXABAN 5 MG PO TABS: 10.0000 mg | ORAL_TABLET | Freq: Two times a day (BID) | ORAL | 0 refills | Status: DC

## 2018-11-26 MED ORDER — LORAZEPAM 2 MG/ML IJ SOLN: 1.0000 mg | Freq: Every day | INTRAMUSCULAR | 0 refills | Status: DC | PRN

## 2018-11-26 MED ORDER — APIXABAN 5 MG PO TABS: 5.0000 mg | ORAL_TABLET | Freq: Two times a day (BID) | ORAL | 0 refills | Status: DC

## 2018-11-26 MED ORDER — IPRATROPIUM-ALBUTEROL 0.5-2.5 (3) MG/3ML IN SOLN: 3.0000 mL | Freq: Two times a day (BID) | RESPIRATORY_TRACT | Status: DC

## 2018-11-26 NOTE — Discharge Summary (Signed)
[facility] NAME: Katherine [name]    MR#:  [id number]  DATE OF BIRTH:  04-10-1940  DATE OF ADMISSION:  11/21/2018   ADMITTING PHYSICIAN: Dustin Flock, MD  DATE OF DISCHARGE: 11/26/2018  PRIMARY CARE PHYSICIAN: Valerie Roys, DO   ADMISSION DIAGNOSIS:  Swelling [R60.9] Chronic respiratory failure with hypoxia (HCC) [J96.11] Other acute pulmonary embolism without acute cor pulmonale (HCC) [I26.99] Chest pain, unspecified type [R07.9] DISCHARGE DIAGNOSIS:  Active Problems:   Pulmonary embolism ([place])  SECONDARY DIAGNOSIS:   Past Medical History:  Diagnosis Date  . Angioedema   . Anxiety and depression   . CHF (congestive heart failure) ([place])   . Chronic constipation   . Chronic kidney disease    stage 3  . COPD (chronic obstructive pulmonary disease) ([place])   . Diabetes mellitus without complication ([place])   . Gallstones   . GERD (gastroesophageal reflux disease)   . Hypertension   . Left ventricular hypertrophy   . Osteoarthritis   . Prurigo nodularis   . Seizures ([place])   . Stroke ([place])   . Tobacco abuse   . Vitamin D deficiency disease    HOSPITAL COURSE:  Chief complaint; chest pain or shortness of breath   History of presenting complaint; Katherine [name]  is a 78 y.o. female with a known history of congestive heart failure, chronic kidney disease stage III, COPD, diabetes type 2, GERD, hypertension, morbid obesity, history of seizures, history of previous stroke who resides in a [facility] who presented with chest pain or shortness of breath.  The symptoms have been going on for the past few days.  Patient is on chronic oxygen at 2 L.  Patient came to this emergency room with these complaints and was noted to have acute pulmonary embolism.   Patient reported being bedbound at baseline.  Was admitted to medical service for further evaluation.  Hospital course; 1.  Acute pulmonary embolism-this was  the cause of patient's worsening shortness of breath and chest pain. Patient CT scan was positive for small pulmonary embolism as mentioned.  Dopplers of her lower extremities are negative for DVT.Patient was initially on heparin drip which has been transitioned to Eliquis.  Patient currently on 2 L of oxygen.  Patient states she is on home oxygen therapy as well. Patient reported to have had some mild nosebleed  2 nights ago but resolved after discontinuation of Plavix.  No further bleeding reported.  CBC remained stable.  Repeat Covid test came back negative.  Patient clinically and hemodynamically stable with plans for discharge back to nursing facility today.  Prescription for Eliquis 10 mg p.o. twice daily to complete total of 7-day treatment duration before transitioning to 5 mg p.o. twice daily given.  Follow-up with MD at nursing home.   2.  Diabetes type 2 with-resume outpatient diabetic regimen.  MD at nursing home to monitor blood sugars and adjust regimen as needed.   3.  Diabetic neuropathy-continue gabapentin. 4.  History of previous CVA-Plavix discontinued now.patient started on Eliquis to decrease risk of bleed.  Continue statins  5.  hx of seizures-no acute seizure definitively.  Continue Keppra. 6.  COPD-no acute exacerbation-continue Dulera, Incruse Ellipta, duo nebs, albuterol nebs as needed. 7.  Depression- continue Prozac 8.  Right-sided jaw pain.;  Resolved Patient apparently does not use dentures which is likely etiology.  I have changed her diet from regular consistency to soft diet. Given atypical presentation and  risk factors, stat twelve-lead EKG done with sinus tachycardia with rate of 103.  Acute coronary syndrome ruled out.   Disposition; patient clinically and hemodynamically stable for discharge back to nursing facility.   DISCHARGE CONDITIONS:  Stable CONSULTS OBTAINED:   DRUG ALLERGIES:   Allergies  Allergen Reactions  . Bee Venom Swelling  . Enalapril  Maleate Swelling   DISCHARGE MEDICATIONS:   Allergies as of 11/26/2018      Reactions   Bee Venom Swelling   Enalapril Maleate Swelling      Medication List    STOP taking these medications   clopidogrel 75 MG tablet Commonly known as: PLAVIX   colchicine 0.6 MG tablet   predniSONE 10 MG tablet Commonly known as: DELTASONE     TAKE these medications   acetaminophen 650 MG CR tablet Commonly known as: TYLENOL Take 650 mg by mouth 3 (three) times daily.   albuterol 0.63 MG/3ML nebulizer solution Commonly known as: ACCUNEB Take 3 mLs by nebulization every 8 (eight) hours as needed for wheezing.   apixaban 5 MG Tabs tablet Commonly known as: ELIQUIS Take 2 tablets (10 mg total) by mouth 2 (two) times daily for 5 doses.   apixaban 5 MG Tabs tablet Commonly known as: ELIQUIS Take 1 tablet (5 mg total) by mouth 2 (two) times daily. To initiate this on 11/29/2018 after completing the 10 mg twice daily treatment regimen Start taking on: November 29, 2018   atorvastatin 80 MG tablet Commonly known as: LIPITOR Take 80 mg by mouth every evening.   cetirizine 10 MG tablet Commonly known as: ZYRTEC Take 5 mg by mouth at bedtime.   cholecalciferol 1000 units tablet Commonly known as: VITAMIN D Take 1,000 Units by mouth daily.   cyanocobalamin 1000 MCG tablet Take 1 tablet (1,000 mcg total) by mouth daily.   diclofenac sodium 1 % Gel Commonly known as: VOLTAREN Apply 2 g topically every 12 (twelve) hours as needed (for left knee pain).   docusate sodium 100 MG capsule Commonly known as: COLACE Take 100 mg by mouth 2 (two) times daily.   FLUoxetine 40 MG capsule Commonly known as: PROZAC Take 40 mg by mouth daily.   Fluticasone-Umeclidin-Vilant 100-62.5-25 MCG/INH Aepb Inhale 1 puff into the lungs daily.   gabapentin 300 MG capsule Commonly known as: NEURONTIN Take 300 mg by mouth at bedtime.   hydrALAZINE 25 MG tablet Commonly known as: APRESOLINE Take 25 mg  by mouth 3 (three) times daily.   ipratropium 0.03 % nasal spray Commonly known as: ATROVENT Place 2 sprays into both nostrils 2 (two) times daily.   ipratropium-albuterol 0.5-2.5 (3) MG/3ML Soln Commonly known as: DUONEB Take 3 mLs by nebulization 3 (three) times daily.   levETIRAcetam 500 MG 24 hr tablet Commonly known as: KEPPRA XR Take 1 tablet (500 mg total) by mouth daily.   linagliptin 5 MG Tabs tablet Commonly known as: TRADJENTA Take 5 mg by mouth daily.   LORazepam 2 MG/ML injection Commonly known as: ATIVAN Inject 0.5 mLs (1 mg total) into the muscle daily as needed for seizure. What changed:   when to take this  reasons to take this  additional instructions   meclizine 25 MG tablet Commonly known as: ANTIVERT Take 25 mg by mouth every 12 (twelve) hours as needed for dizziness.   multivitamin with minerals Tabs tablet Take 1 tablet by mouth daily.   mupirocin ointment 2 % Commonly known as: BACTROBAN Place 1 application into the nose 2 (two) times  daily.   nitroGLYCERIN 0.4 MG SL tablet Commonly known as: NITROSTAT Place 0.4 mg under the tongue as needed.   pantoprazole 40 MG tablet Commonly known as: PROTONIX Take 1 tablet (40 mg total) by mouth daily.        DISCHARGE INSTRUCTIONS:   DIET:  Diabetic diet DISCHARGE CONDITION:  Stable ACTIVITY:  Activity as tolerated OXYGEN:  Home Oxygen: Yes.    Oxygen Delivery: Patient on 2 L of continuous home oxygen therapy. DISCHARGE LOCATION:  nursing home   If you experience worsening of your admission symptoms, develop shortness of breath, life threatening emergency, suicidal or homicidal thoughts you must seek medical attention immediately by calling 911 or calling your MD immediately  if symptoms less severe.  You Must read complete instructions/literature along with all the possible adverse reactions/side effects for all the Medicines you take and that have been prescribed to you. Take any new  Medicines after you have completely understood and accpet all the possible adverse reactions/side effects.   Please note  You were cared for by a hospitalist during your hospital stay. If you have any questions about your discharge medications or the care you received while you were in the hospital after you are discharged, you can call the unit and asked to speak with the hospitalist on call if the hospitalist that took care of you is not available. Once you are discharged, your primary care physician will handle any further medical issues. Please note that NO REFILLS for any discharge medications will be authorized once you are discharged, as it is imperative that you return to your primary care physician (or establish a relationship with a primary care physician if you do not have one) for your aftercare needs so that they can reassess your need for medications and monitor your lab values.    On the day of Discharge:  VITAL SIGNS:  Blood pressure 132/84, pulse 92, temperature 98.1 F (36.7 C), temperature source Oral, resp. rate 18, height 5\' 8"  (1.727 m), weight 104 kg, SpO2 99 %. PHYSICAL EXAMINATION:  GENERAL:  78 y.o.-year-old patient lying in the bed with no acute distress.  EYES: Pupils equal, round, reactive to light and accommodation. No scleral icterus. Extraocular muscles intact.  HEENT: Head atraumatic, normocephalic. Oropharynx and nasopharynx clear.  NECK:  Supple, no jugular venous distention. No thyroid enlargement, no tenderness.  LUNGS: Normal breath sounds bilaterally, no wheezing, rales,rhonchi or crepitation. No use of accessory muscles of respiration.  CARDIOVASCULAR: S1, S2 normal. No murmurs, rubs, or gallops.  ABDOMEN: Soft, non-tender, non-distended. Bowel sounds present. No organomegaly or mass.  EXTREMITIES: No pedal edema, cyanosis, or clubbing.  NEUROLOGIC: Generalized weakness.  Sensation intact. Gait not checked.  PSYCHIATRIC: The patient is alert and oriented  x 3.  SKIN: No obvious rash, lesion, or ulcer.  DATA REVIEW:   CBC Recent Labs  Lab 11/26/18 0416  WBC 4.7  HGB 8.2*  HCT 25.7*  PLT 318    Chemistries  Recent Labs  Lab 11/21/18 1443 11/22/18 0510  NA 135 137  K 4.3 4.1  CL 101 104  CO2 27 25  GLUCOSE 89 111*  BUN 18 19  CREATININE 1.35* 1.41*  CALCIUM 9.5 8.8*  AST 101*  --   ALT 45*  --   ALKPHOS 233*  --   BILITOT 0.6  --      Microbiology Results  Results for orders placed or performed during the hospital encounter of 11/21/18  SARS Coronavirus 2 by RT  PCR (hospital order, performed in [facility] hospital lab) Nasopharyngeal Nasopharyngeal Swab     Status: None   Collection Time: 11/21/18  3:35 PM   Specimen: Nasopharyngeal Swab  Result Value Ref Range Status   SARS Coronavirus 2 NEGATIVE NEGATIVE Final    Comment: (NOTE) If result is NEGATIVE SARS-CoV-2 target nucleic acids are NOT DETECTED. The SARS-CoV-2 RNA is generally detectable in upper and lower  respiratory specimens during the acute phase of infection. The lowest  concentration of SARS-CoV-2 viral copies this assay can detect is 250  copies / mL. A negative result does not preclude SARS-CoV-2 infection  and should not be used as the sole basis for treatment or other  patient management decisions.  A negative result may occur with  improper specimen collection / handling, submission of specimen other  than nasopharyngeal swab, presence of viral mutation(s) within the  areas targeted by this assay, and inadequate number of viral copies  (<250 copies / mL). A negative result must be combined with clinical  observations, patient history, and epidemiological information. If result is POSITIVE SARS-CoV-2 target nucleic acids are DETECTED. The SARS-CoV-2 RNA is generally detectable in upper and lower  respiratory specimens dur ing the acute phase of infection.  Positive  results are indicative of active infection with SARS-CoV-2.  Clinical   correlation with patient history and other diagnostic information is  necessary to determine patient infection status.  Positive results do  not rule out bacterial infection or co-infection with other viruses. If result is PRESUMPTIVE POSTIVE SARS-CoV-2 nucleic acids MAY BE PRESENT.   A presumptive positive result was obtained on the submitted specimen  and confirmed on repeat testing.  While 2019 novel coronavirus  (SARS-CoV-2) nucleic acids may be present in the submitted sample  additional confirmatory testing may be necessary for epidemiological  and / or clinical management purposes  to differentiate between  SARS-CoV-2 and other Sarbecovirus currently known to infect humans.  If clinically indicated additional testing with an alternate test  methodology [phone]) is advised. The SARS-CoV-2 RNA is generally  detectable in upper and lower respiratory sp ecimens during the acute  phase of infection. The expected result is Negative. Fact Sheet for Patients:  StrictlyIdeas.no Fact Sheet for Healthcare Providers: BankingDealers.co.za This test is not yet approved or cleared by the [place] FDA and has been authorized for detection and/or diagnosis of SARS-CoV-2 by FDA under an Emergency Use Authorization (EUA).  This EUA will remain in effect (meaning this test can be used) for the duration of the COVID-19 declaration under Section 564(b)(1) of the Act, 21 U.S.C. section 360bbb-3(b)(1), unless the authorization is terminated or revoked sooner. Performed at [name] [address] [place], [place] [address]   MRSA PCR Screening     Status: Abnormal   Collection Time: 11/21/18 11:24 PM   Specimen: Nasal Mucosa; Nasopharyngeal  Result Value Ref Range Status   MRSA by PCR POSITIVE (A) NEGATIVE Final    Comment:        The GeneXpert MRSA Assay (FDA approved for NASAL specimens only), is one component of a  comprehensive MRSA colonization surveillance program. It is not intended to diagnose MRSA infection nor to guide or monitor treatment for MRSA infections. RESULT CALLED TO, READ BACK BY AND VERIFIED WITH: Basil Dess RN AT 0130 11/22/2018 SNG Performed at [facility], [address] [place], [place] [address]   Novel Coronavirus, NAA [facility] order, Send-out to Ref Lab; TAT 18-24 hrs  Status: None   Collection Time: 11/24/18 11:16 AM   Specimen: Nasopharyngeal Swab; Respiratory  Result Value Ref Range Status   SARS-CoV-2, NAA NOT DETECTED NOT DETECTED Final    Comment: (NOTE) This nucleic acid amplification test was developed and its performance characteristics determined by [employer]. Nucleic acid amplification tests include PCR and TMA. This test has not been FDA cleared or approved. This test has been authorized by FDA under an Emergency Use Authorization (EUA). This test is only authorized for the duration of time the declaration that circumstances exist justifying the authorization of the emergency use of in vitro diagnostic tests for detection of SARS-CoV-2 virus and/or diagnosis of COVID-19 infection under section 564(b)(1) of the Act, 21 U.S.C. [id number]) (1), unless the authorization is terminated or revoked sooner. When diagnostic testing is negative, the possibility of a false negative result should be considered in the context of a patient's recent exposures and the presence of clinical signs and symptoms consistent with COVID-19. An individual without symptoms of COVID- 19 and who is not shedding SARS-CoV-2 vi rus would expect to have a negative (not detected) result in this assay. Performed At: [facility] [place], [place] [id number] Rush Farmer MD [id number]    [place]  Final    Comment: Performed at [facility], [address] [place], [place] [address]     RADIOLOGY:  No results found.   Management plans discussed with the patient, family and they are in agreement.  CODE STATUS: Full Code   TOTAL TIME TAKING CARE OF THIS PATIENT: 38 minutes.    Kell Ferris M.D on 11/26/2018 at 10:01 AM  Between 7am to 6pm - Pager - [phone]  After 6pm go to [email or web address] - Proofreader  [facility]  Office  [phone]  CC: Primary care physician; Valerie Roys, DO   Note: This dictation was prepared with Dragon dictation along with smaller phrase technology. Any transcriptional errors that result from this process are unintentional.

## 2018-11-26 NOTE — Progress Notes (Signed)
Katherine [name]  A and O x 4 VSS. Pt tolerating diet well. No complaints of pain or nausea. IV removed intact, prescriptions given. Pt voices understanding of discharge instructions with no further questions. Pt discharged to [facility] via EMS.    Vitals:   11/26/18 0742 11/26/18 1107  BP:  101/62  Pulse: 92 89  Resp: 18 16  Temp:  (!) 97.4 F (36.3 C)  SpO2: 99% 99%    [name] Payton Mccallum

## 2018-11-26 NOTE — TOC Transition Note (Signed)
Transition of Care [facility]) - CM/SW Discharge Note   Patient Details  Name: Katherine [name] MRN: [id number] Date of Birth: 10/29/1940  [facility] CM/SW Contact:  Beverly Sessions, RN Phone Number: 11/26/2018, 10:11 AM   Clinical Narrative:    Patient to discharge back to [facility] today   Claiborne Billings at [facility] aware  Patient to transport by EMS EMS packet on chart.  Bedside RN notified    Final next level of care: Skilled Nursing Facility Barriers to Discharge: Barriers Resolved   Patient Goals and CMS Choice        Discharge Placement              Patient chooses bed at: [facility] Patient to be transferred to facility by: EMS      Discharge Plan and Services                                     Social Determinants of Health (SDOH) Interventions     Readmission Risk Interventions Readmission Risk Prevention Plan 11/26/2018 11/22/2018 07/16/2018  Transportation Screening Complete Complete Complete  Medication Review Press photographer) Complete Complete Complete  PCP or Specialist appointment within 3-5 days of discharge (No Data) - Complete  PCP/Specialist Appt Not Complete comments - - -  HRI or Home Care Consult (No Data) - Complete  SW Recovery Care/Counseling Consult Complete - Not Complete  SW Consult Not Complete Comments - - na  Palliative Care Screening - - Not Applicable  Medication Reconcilation ([place] Complete Complete Complete  Some recent data might be hidden

## 2018-11-27 DIAGNOSIS — J449 Chronic obstructive pulmonary disease, unspecified: Secondary | ICD-10-CM | POA: Diagnosis not present

## 2018-11-27 DIAGNOSIS — I2510 Atherosclerotic heart disease of native coronary artery without angina pectoris: Secondary | ICD-10-CM | POA: Diagnosis not present

## 2018-11-27 DIAGNOSIS — U071 COVID-19: Secondary | ICD-10-CM | POA: Diagnosis not present

## 2018-12-03 DIAGNOSIS — J9611 Chronic respiratory failure with hypoxia: Secondary | ICD-10-CM | POA: Diagnosis not present

## 2018-12-03 DIAGNOSIS — Z9981 Dependence on supplemental oxygen: Secondary | ICD-10-CM | POA: Diagnosis not present

## 2018-12-03 DIAGNOSIS — I509 Heart failure, unspecified: Secondary | ICD-10-CM | POA: Diagnosis not present

## 2018-12-03 DIAGNOSIS — I2699 Other pulmonary embolism without acute cor pulmonale: Secondary | ICD-10-CM | POA: Diagnosis not present

## 2018-12-05 DIAGNOSIS — R040 Epistaxis: Secondary | ICD-10-CM | POA: Diagnosis not present

## 2018-12-10 DIAGNOSIS — N390 Urinary tract infection, site not specified: Secondary | ICD-10-CM | POA: Diagnosis not present

## 2018-12-17 DIAGNOSIS — E119 Type 2 diabetes mellitus without complications: Secondary | ICD-10-CM | POA: Diagnosis not present

## 2018-12-17 DIAGNOSIS — E7849 Other hyperlipidemia: Secondary | ICD-10-CM | POA: Diagnosis not present

## 2018-12-17 DIAGNOSIS — N390 Urinary tract infection, site not specified: Secondary | ICD-10-CM | POA: Diagnosis not present

## 2018-12-17 DIAGNOSIS — Z5181 Encounter for therapeutic drug level monitoring: Secondary | ICD-10-CM | POA: Diagnosis not present

## 2018-12-17 DIAGNOSIS — R319 Hematuria, unspecified: Secondary | ICD-10-CM | POA: Diagnosis not present

## 2018-12-17 DIAGNOSIS — Z79899 Other long term (current) drug therapy: Secondary | ICD-10-CM | POA: Diagnosis not present

## 2018-12-18 DIAGNOSIS — R040 Epistaxis: Secondary | ICD-10-CM | POA: Diagnosis not present

## 2018-12-19 ENCOUNTER — Emergency Department: Payer: Medicare HMO

## 2018-12-19 ENCOUNTER — Other Ambulatory Visit: Payer: Self-pay

## 2018-12-19 ENCOUNTER — Inpatient Hospital Stay
Admission: EM | Admit: 2018-12-19 | Discharge: 2018-12-21 | DRG: 177 | Disposition: A | Discharge status: Other Acute Inpatient Hospital | Payer: Medicare HMO | Source: Skilled Nursing Facility | Attending: Family Medicine | Admitting: Family Medicine

## 2018-12-19 ENCOUNTER — Encounter: Payer: Self-pay | Admitting: Emergency Medicine

## 2018-12-19 DIAGNOSIS — N390 Urinary tract infection, site not specified: Secondary | ICD-10-CM | POA: Diagnosis present

## 2018-12-19 DIAGNOSIS — J9621 Acute and chronic respiratory failure with hypoxia: Secondary | ICD-10-CM | POA: Diagnosis present

## 2018-12-19 DIAGNOSIS — N1832 Chronic kidney disease, stage 3b: Secondary | ICD-10-CM | POA: Diagnosis present

## 2018-12-19 DIAGNOSIS — G9341 Metabolic encephalopathy: Secondary | ICD-10-CM | POA: Diagnosis present

## 2018-12-19 DIAGNOSIS — R519 Headache, unspecified: Secondary | ICD-10-CM | POA: Diagnosis not present

## 2018-12-19 DIAGNOSIS — D72819 Decreased white blood cell count, unspecified: Secondary | ICD-10-CM | POA: Diagnosis present

## 2018-12-19 DIAGNOSIS — I959 Hypotension, unspecified: Secondary | ICD-10-CM | POA: Diagnosis not present

## 2018-12-19 DIAGNOSIS — Z8249 Family history of ischemic heart disease and other diseases of the circulatory system: Secondary | ICD-10-CM

## 2018-12-19 DIAGNOSIS — E1122 Type 2 diabetes mellitus with diabetic chronic kidney disease: Secondary | ICD-10-CM | POA: Diagnosis present

## 2018-12-19 DIAGNOSIS — Z86711 Personal history of pulmonary embolism: Secondary | ICD-10-CM | POA: Diagnosis not present

## 2018-12-19 DIAGNOSIS — Z8673 Personal history of transient ischemic attack (TIA), and cerebral infarction without residual deficits: Secondary | ICD-10-CM

## 2018-12-19 DIAGNOSIS — Z832 Family history of diseases of the blood and blood-forming organs and certain disorders involving the immune mechanism: Secondary | ICD-10-CM

## 2018-12-19 DIAGNOSIS — U071 COVID-19: Principal | ICD-10-CM | POA: Diagnosis present

## 2018-12-19 DIAGNOSIS — R042 Hemoptysis: Secondary | ICD-10-CM | POA: Diagnosis not present

## 2018-12-19 DIAGNOSIS — Z7984 Long term (current) use of oral hypoglycemic drugs: Secondary | ICD-10-CM

## 2018-12-19 DIAGNOSIS — Z87891 Personal history of nicotine dependence: Secondary | ICD-10-CM

## 2018-12-19 DIAGNOSIS — J1289 Other viral pneumonia: Secondary | ICD-10-CM | POA: Diagnosis present

## 2018-12-19 DIAGNOSIS — Z7951 Long term (current) use of inhaled steroids: Secondary | ICD-10-CM

## 2018-12-19 DIAGNOSIS — J440 Chronic obstructive pulmonary disease with acute lower respiratory infection: Secondary | ICD-10-CM | POA: Diagnosis present

## 2018-12-19 DIAGNOSIS — I2699 Other pulmonary embolism without acute cor pulmonale: Secondary | ICD-10-CM | POA: Diagnosis not present

## 2018-12-19 DIAGNOSIS — Z7901 Long term (current) use of anticoagulants: Secondary | ICD-10-CM | POA: Diagnosis not present

## 2018-12-19 DIAGNOSIS — D631 Anemia in chronic kidney disease: Secondary | ICD-10-CM | POA: Diagnosis present

## 2018-12-19 DIAGNOSIS — J449 Chronic obstructive pulmonary disease, unspecified: Secondary | ICD-10-CM

## 2018-12-19 DIAGNOSIS — Z96651 Presence of right artificial knee joint: Secondary | ICD-10-CM | POA: Diagnosis present

## 2018-12-19 DIAGNOSIS — J9611 Chronic respiratory failure with hypoxia: Secondary | ICD-10-CM | POA: Diagnosis not present

## 2018-12-19 DIAGNOSIS — E669 Obesity, unspecified: Secondary | ICD-10-CM | POA: Diagnosis present

## 2018-12-19 DIAGNOSIS — J441 Chronic obstructive pulmonary disease with (acute) exacerbation: Secondary | ICD-10-CM | POA: Diagnosis not present

## 2018-12-19 DIAGNOSIS — F329 Major depressive disorder, single episode, unspecified: Secondary | ICD-10-CM | POA: Diagnosis present

## 2018-12-19 DIAGNOSIS — Z8619 Personal history of other infectious and parasitic diseases: Secondary | ICD-10-CM | POA: Diagnosis not present

## 2018-12-19 DIAGNOSIS — E559 Vitamin D deficiency, unspecified: Secondary | ICD-10-CM | POA: Diagnosis present

## 2018-12-19 DIAGNOSIS — K219 Gastro-esophageal reflux disease without esophagitis: Secondary | ICD-10-CM | POA: Diagnosis present

## 2018-12-19 DIAGNOSIS — I5032 Chronic diastolic (congestive) heart failure: Secondary | ICD-10-CM | POA: Diagnosis present

## 2018-12-19 DIAGNOSIS — E785 Hyperlipidemia, unspecified: Secondary | ICD-10-CM | POA: Diagnosis present

## 2018-12-19 DIAGNOSIS — R5381 Other malaise: Secondary | ICD-10-CM | POA: Diagnosis not present

## 2018-12-19 DIAGNOSIS — R0689 Other abnormalities of breathing: Secondary | ICD-10-CM | POA: Diagnosis not present

## 2018-12-19 DIAGNOSIS — R040 Epistaxis: Secondary | ICD-10-CM | POA: Diagnosis present

## 2018-12-19 DIAGNOSIS — R0902 Hypoxemia: Secondary | ICD-10-CM | POA: Diagnosis not present

## 2018-12-19 DIAGNOSIS — G40909 Epilepsy, unspecified, not intractable, without status epilepticus: Secondary | ICD-10-CM | POA: Diagnosis present

## 2018-12-19 DIAGNOSIS — I130 Hypertensive heart and chronic kidney disease with heart failure and stage 1 through stage 4 chronic kidney disease, or unspecified chronic kidney disease: Secondary | ICD-10-CM | POA: Diagnosis present

## 2018-12-19 DIAGNOSIS — Z1612 Extended spectrum beta lactamase (ESBL) resistance: Secondary | ICD-10-CM | POA: Diagnosis present

## 2018-12-19 DIAGNOSIS — F419 Anxiety disorder, unspecified: Secondary | ICD-10-CM | POA: Diagnosis present

## 2018-12-19 LAB — BLOOD GAS, VENOUS
Acid-Base Excess: 1.5 mmol/L (ref 0.0–2.0)
Bicarbonate: 24.7 mmol/L (ref 20.0–28.0)
O2 Saturation: 98.6 %
Patient temperature: 37
pCO2, Ven: 34 mmHg — ABNORMAL LOW (ref 44.0–60.0)
pH, Ven: 7.47 — ABNORMAL HIGH (ref 7.250–7.430)
pO2, Ven: 111 mmHg — ABNORMAL HIGH (ref 32.0–45.0)

## 2018-12-19 LAB — CBC WITH DIFFERENTIAL/PLATELET
Abs Immature Granulocytes: 0.02 10*3/uL (ref 0.00–0.07)
Basophils Absolute: 0 10*3/uL (ref 0.0–0.1)
Basophils Relative: 0 %
Eosinophils Absolute: 0.1 10*3/uL (ref 0.0–0.5)
Eosinophils Relative: 3 %
HCT: 25.4 % — ABNORMAL LOW (ref 36.0–46.0)
Hemoglobin: 8 g/dL — ABNORMAL LOW (ref 12.0–15.0)
Immature Granulocytes: 1 %
Lymphocytes Relative: 32 %
Lymphs Abs: 0.9 10*3/uL (ref 0.7–4.0)
MCH: 26.2 pg (ref 26.0–34.0)
MCHC: 31.5 g/dL (ref 30.0–36.0)
MCV: 83.3 fL (ref 80.0–100.0)
Monocytes Absolute: 0.3 10*3/uL (ref 0.1–1.0)
Monocytes Relative: 10 %
Neutro Abs: 1.6 10*3/uL — ABNORMAL LOW (ref 1.7–7.7)
Neutrophils Relative %: 54 %
Platelets: 235 10*3/uL (ref 150–400)
RBC: 3.05 MIL/uL — ABNORMAL LOW (ref 3.87–5.11)
RDW: 19.3 % — ABNORMAL HIGH (ref 11.5–15.5)
WBC: 2.9 10*3/uL — ABNORMAL LOW (ref 4.0–10.5)
nRBC: 0 % (ref 0.0–0.2)

## 2018-12-19 LAB — LACTIC ACID, PLASMA: Lactic Acid, Venous: 1 mmol/L (ref 0.5–1.9)

## 2018-12-19 LAB — COMPREHENSIVE METABOLIC PANEL
ALT: 33 U/L (ref 0–44)
AST: 89 U/L — ABNORMAL HIGH (ref 15–41)
Albumin: 2.1 g/dL — ABNORMAL LOW (ref 3.5–5.0)
Alkaline Phosphatase: 257 U/L — ABNORMAL HIGH (ref 38–126)
Anion gap: 7 (ref 5–15)
BUN: 19 mg/dL (ref 8–23)
CO2: 24 mmol/L (ref 22–32)
Calcium: 8.6 mg/dL — ABNORMAL LOW (ref 8.9–10.3)
Chloride: 105 mmol/L (ref 98–111)
Creatinine, Ser: 1.45 mg/dL — ABNORMAL HIGH (ref 0.44–1.00)
GFR calc Af Amer: 40 mL/min — ABNORMAL LOW (ref >60)
GFR calc non Af Amer: 35 mL/min — ABNORMAL LOW (ref >60)
Glucose, Bld: 97 mg/dL (ref 70–99)
Potassium: 4.1 mmol/L (ref 3.5–5.1)
Sodium: 136 mmol/L (ref 135–145)
Total Bilirubin: 0.8 mg/dL (ref 0.3–1.2)
Total Protein: 6.8 g/dL (ref 6.5–8.1)

## 2018-12-19 LAB — PROTIME-INR
INR: 1.4 — ABNORMAL HIGH (ref 0.8–1.2)
Prothrombin Time: 17.3 seconds — ABNORMAL HIGH (ref 11.4–15.2)

## 2018-12-19 LAB — BRAIN NATRIURETIC PEPTIDE: B Natriuretic Peptide: 63 pg/mL (ref 0.0–100.0)

## 2018-12-19 MED ORDER — IPRATROPIUM-ALBUTEROL 0.5-2.5 (3) MG/3ML IN SOLN
3.0000 mL | Freq: Once | RESPIRATORY_TRACT | Status: AC
  Administered 2018-12-19: 3 mL via RESPIRATORY_TRACT
  Filled 2018-12-19: qty 3

## 2018-12-19 NOTE — ED Provider Notes (Signed)
[facility] Emergency Department Provider Note  ____________________________________________   First MD Initiated Contact with Patient 12/19/18 2010     (approximate)  I have reviewed the triage vital signs and the nursing notes.   HISTORY  Chief Complaint Hemoptysis    HPI Katherine [name] is a 78 y.o. female with extensive past medical history as below including CHF, COPD, CKD, recent admission for PE on Eliquis here with reported hemoptysis.  Patient is a somewhat difficult historian, but per report, she had a nosebleed yesterday.  She was not coughing at the time.  Throughout the day today, however, she has not had a nosebleed but has been coughing up grossly bloody sputum.  She has had occasional clots,.  She states she has felt moderately more short of breath over the last day as well with this.  She is currently on Eliquis for recent PE.  She denies any overt chest pain with her symptoms today.  No other complaints.  No fevers or chills.  She wears chronic oxygen and has not had to increase her oxygen requirement.  No specific alleviating or aggravating factors.        Past Medical History:  Diagnosis Date   Angioedema    Anxiety and depression    CHF (congestive heart failure) (HCC)    Chronic constipation    Chronic kidney disease    stage 3   COPD (chronic obstructive pulmonary disease) (HCC)    Diabetes mellitus without complication ([place])    Gallstones    GERD (gastroesophageal reflux disease)    Hypertension    Left ventricular hypertrophy    Osteoarthritis    Prurigo nodularis    Seizures ([place])    Stroke ([place])    Tobacco abuse    Vitamin D deficiency disease     Patient Active Problem List   Diagnosis Date Noted   Pulmonary embolism ([place]) 11/21/2018   Delirium    UTI (urinary tract infection) 08/21/2018   Chest pain 07/15/2018   Palliative care encounter 05/10/2018   Localized edema 05/10/2018    Shortness of breath 05/10/2018   Hematemesis 04/28/2018   Influenza A 04/13/2018   Acute on chronic congestive heart failure ([place])    COPD with acute exacerbation ([place]) 02/24/2018   Chronic diastolic heart failure ([place]) 12/31/2017   Lymphedema 12/31/2017   COPD exacerbation ([place]) 11/30/2017   Urinary tract infection 11/22/2017   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[place]) 11/16/2017   Diabetes mellitus type 2, uncomplicated ([place]) 0000000   Pressure injury of skin 10/29/2017   Acute kidney injury superimposed on CKD ([place]) 10/24/2017   Severe sepsis ([place]) 10/24/2017   Possible Seizures ([place]) 10/08/2017   Hyperlipemia 10/08/2017   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   Stroke-like episode ([place]) s/p IV tpa 10/04/2017   Palliative care by specialist    Elevated rheumatoid factor 09/05/2017   Frequent hospital admissions 08/22/2017   Aphasia 06/28/2017   Hand pain 03/21/2017   Dry skin 03/21/2017   Pain in finger of left hand 09/12/2016   Chronic fatigue 06/12/2016   Left knee pain 06/12/2016   Goals of care, counseling/discussion 03/13/2016   Primary localized osteoarthritis of right knee 02/08/2016   OSA (obstructive sleep apnea) 09/16/2015   Rotator cuff syndrome 09/07/2015   Pulmonary scarring 07/27/2015   Sleep disturbance 04/14/2015   Coronary artery disease 03/14/2015   Polyp of vocal cord 03/14/2015   Acute respiratory failure with hypoxia ([place]) [id number]   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/2014   Anxiety    Tobacco abuse    Prurigo nodularis    GERD (gastroesophageal reflux disease)    COPD (chronic obstructive pulmonary disease) (HCC)    Osteoarthritis    Vitamin D deficiency disease    Chronic constipation    CKD (chronic kidney disease), stage III    Essential hypertension 05/20/2013   Morbid obesity ([place]) 05/20/2013    Past Surgical History:  Procedure Laterality Date   CHOLECYSTECTOMY      POLYPECTOMY  11/2011   vocal cord   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   Procedure: TOTAL KNEE ARTHROPLASTY;  Surgeon: Hessie Knows, MD;  Location: [facility] ORS;  Service: Orthopedics;  Laterality: Right;    Prior to Admission medications   Medication Sig Start Date End Date Taking? Authorizing Provider  acetaminophen (TYLENOL) 650 MG CR tablet Take 650 mg by mouth 3 (three) times daily.    Yes [provider]  albuterol (ACCUNEB) 0.63 MG/3ML nebulizer solution Take 3 mLs by nebulization every 8 (eight) hours as needed for wheezing.    Yes [provider]  apixaban (ELIQUIS) 5 MG TABS tablet Take 1 tablet (5 mg total) by mouth 2 (two) times daily. To initiate this on 11/29/2018 after completing the 10 mg twice daily treatment regimen 11/29/18  Yes Ojie, Jude, MD  atorvastatin (LIPITOR) 80 MG tablet Take 80 mg by mouth every evening.    Yes [provider]  cetirizine (ZYRTEC) 10 MG tablet Take 5 mg by mouth at bedtime.   Yes [provider]  cholecalciferol (VITAMIN D) 1000 units tablet Take 1,000 Units by mouth daily.   Yes [provider]  diclofenac sodium (VOLTAREN) 1 % GEL Apply 2 g topically every 12 (twelve) hours as needed (for left knee pain).    Yes [provider]  docusate sodium (COLACE) 100 MG capsule Take 100 mg by mouth 2 (two) times daily.   Yes [provider]  ertapenem (INVANZ) 1 g injection Inject 1 g into the muscle daily. 12/19/18 12/29/18 Yes [provider]  FLUoxetine (PROZAC) 40 MG capsule Take 40 mg by mouth daily.    Yes [provider]  Fluticasone-Umeclidin-Vilant 100-62.5-25 MCG/INH AEPB Inhale 1 puff into the lungs daily.   Yes [provider]  gabapentin (NEURONTIN) 300 MG capsule Take 300 mg by mouth at bedtime.   Yes [provider]  hydrALAZINE (APRESOLINE) 25 MG tablet Take 25 mg by mouth 3 (three) times daily. 06/19/18  Yes [provider]    ipratropium (ATROVENT) 0.03 % nasal spray Place 2 sprays into both nostrils 2 (two) times daily.    Yes [provider]  ipratropium-albuterol (DUONEB) 0.5-2.5 (3) MG/3ML SOLN Take 3 mLs by nebulization 3 (three) times daily. 07/17/18  Yes Gladstone Lighter, MD  levETIRAcetam (KEPPRA XR) 500 MG 24 hr tablet Take 1 tablet (500 mg total) by mouth daily. 11/19/17  Yes McCue, Janett Billow, NP  linagliptin (TRADJENTA) 5 MG TABS tablet Take 5 mg by mouth daily.   Yes [provider]  meclizine (ANTIVERT) 25 MG tablet Take 25 mg by mouth every 12 (twelve) hours as needed for dizziness.    Yes [provider]  Multiple Vitamin (MULTIVITAMIN WITH MINERALS) TABS tablet Take 1 tablet by mouth daily.   Yes [provider]  mupirocin ointment (BACTROBAN) 2 % Place 1 application into the nose 2 (two) times daily. 06/13/18  Yes Wieting, Richard, MD  nitrofurantoin, macrocrystal-monohydrate, (MACROBID) 100 MG capsule Take 100 mg  by mouth 2 (two) times daily. 12/19/18 12/25/18 Yes [provider]  nitroGLYCERIN (NITROSTAT) 0.4 MG SL tablet Place 0.4 mg under the tongue every 5 (five) minutes as needed for chest pain.  08/07/18  Yes [provider]  oxymetazoline (AFRIN) 0.05 % nasal spray Place 1 spray into both nostrils 2 (two) times daily. 12/18/18 12/22/18 Yes [provider]  pantoprazole (PROTONIX) 40 MG tablet Take 1 tablet (40 mg total) by mouth daily. 04/29/18  Yes Bettey Costa, MD  vitamin B-12 1000 MCG tablet Take 1 tablet (1,000 mcg total) by mouth daily. 04/30/18  Yes Bettey Costa, MD  apixaban (ELIQUIS) 5 MG TABS tablet Take 2 tablets (10 mg total) by mouth 2 (two) times daily for 5 doses. Patient not taking: Reported on 12/19/2018 11/26/18 12/19/18  Otila Back, MD  LORazepam (ATIVAN) 2 MG/ML injection Inject 0.5 mLs (1 mg total) into the muscle daily as needed for seizure. Patient not taking: Reported on 12/19/2018 11/26/18   Otila Back, MD     Allergies Bee venom and Enalapril maleate  Family History  Problem Relation Age of Onset   Alcohol abuse Mother    Sickle cell anemia Daughter    Hypertension Son    Cancer Neg Hx    COPD Neg Hx    Diabetes Neg Hx    Heart disease Neg Hx    Stroke Neg Hx     Social History Social History   Tobacco Use   Smoking status: Former Smoker    Packs/day: 0.50    Years: 60.00    Pack years: 30.00    Types: Cigarettes   Smokeless tobacco: Never Used   Tobacco comment: quite 47mo ago-03/26/18  Substance Use Topics   Alcohol use: No    Alcohol/week: 0.0 standard drinks    Comment: rare   Drug use: No    Review of Systems  Review of Systems  Constitutional: Positive for fatigue. Negative for fever.  HENT: Positive for nosebleeds. Negative for congestion and sore throat.   Eyes: Negative for visual disturbance.  Respiratory: Positive for cough and shortness of breath.   Cardiovascular: Negative for chest pain.  Gastrointestinal: Negative for abdominal pain, diarrhea, nausea and vomiting.  Genitourinary: Negative for flank pain.  Musculoskeletal: Negative for back pain and neck pain.  Skin: Negative for rash and wound.  Neurological: Positive for headaches (new, since nosebleed - on anticoag). Negative for weakness.  All other systems reviewed and are negative.    ____________________________________________  PHYSICAL EXAM:      VITAL SIGNS: ED Triage Vitals [12/19/18 2003]  Enc Vitals Group     BP 118/75     Pulse Rate 86     Resp (!) 28     Temp 98.1 F (36.7 C)     Temp Source Oral     SpO2 99 %     Weight 226 lb (102.5 kg)     Height 5\' 3"  (1.6 m)     Head Circumference      Peak Flow      Pain Score 0     Pain Loc      Pain Edu?      Excl. in [place]?      Physical Exam Vitals signs and nursing note reviewed.  Constitutional:      General: She is not in acute distress.    Appearance: She is well-developed. She is obese.     Comments:  Chronically ill appearing, nasal cannula in place  HENT:     Head: Normocephalic and atraumatic.     Comments: Dried blood R anterior nasal mucosa, with mucosal pallor and dryness bilaterally. OP with dried blood in posterior oropharynx and streaking tongue. Eyes:     Conjunctiva/sclera: Conjunctivae normal.  Neck:     Musculoskeletal: Neck supple.  Cardiovascular:     Rate and Rhythm: Normal rate and regular rhythm.     Heart sounds: Normal heart sounds. No murmur. No friction rub.  Pulmonary:     Effort: Pulmonary effort is normal. No respiratory distress.     Breath sounds: Decreased air movement present. Decreased breath sounds and wheezing present. No rales.  Abdominal:     General: There is no distension.     Palpations: Abdomen is soft.     Tenderness: There is no abdominal tenderness.  Musculoskeletal:     Right lower leg: Edema present.     Left lower leg: Edema present.  Skin:    General: Skin is warm.     Capillary Refill: Capillary refill takes less than 2 seconds.  Neurological:     Mental Status: She is alert and oriented to person, place, and time.     Motor: No abnormal muscle tone.       ____________________________________________   LABS (all labs ordered are listed, but only abnormal results are displayed)  Labs Reviewed  CBC WITH DIFFERENTIAL/PLATELET - Abnormal; Notable for the following components:      Result Value   WBC 2.9 (*)    RBC 3.05 (*)    Hemoglobin 8.0 (*)    HCT 25.4 (*)    RDW 19.3 (*)    Neutro Abs 1.6 (*)    All other components within normal limits  COMPREHENSIVE METABOLIC PANEL - Abnormal; Notable for the following components:   Creatinine, Ser 1.45 (*)    Calcium 8.6 (*)    Albumin 2.1 (*)    AST 89 (*)    Alkaline Phosphatase 257 (*)    GFR calc non Af Amer 35 (*)    GFR calc Af Amer 40 (*)    All other components within normal limits  PROTIME-INR - Abnormal; Notable for the following components:   Prothrombin Time 17.3  (*)    INR 1.4 (*)    All other components within normal limits  BLOOD GAS, VENOUS - Abnormal; Notable for the following components:   pH, Ven 7.47 (*)    pCO2, Ven 34 (*)    pO2, Ven 111.0 (*)    All other components within normal limits  SARS CORONAVIRUS 2 (TAT 6-24 HRS)  BRAIN NATRIURETIC PEPTIDE  LACTIC ACID, PLASMA  LACTIC ACID, PLASMA    ____________________________________________  ________________________________  RADIOLOGY All imaging, including plain films, CT scans, and ultrasounds, independently reviewed by me, and interpretations confirmed via formal radiology reads.  ED MD interpretation:   CXR: No acute abnormality CT Head: [place]   Official radiology report(s): Dg Chest 2 View  Result Date: 12/19/2018 CLINICAL DATA:  Hemoptysis, cough EXAM: CHEST - 2 VIEW COMPARISON:  11/24/2018 FINDINGS: Increased markings in the lower lungs, likely scarring, similar to prior study. Heart is borderline in size. Aortic atherosclerosis. No acute airspace opacities or effusions. No acute bony abnormality. IMPRESSION: Bibasilar linear densities, likely scarring. No definite acute process. Electronically Signed   By: Rolm Baptise M.D.   On: 12/19/2018 20:59   Ct Head Wo Contrast  Result Date: 12/19/2018 CLINICAL DATA:  Headache, on Eliquis EXAM: CT HEAD WITHOUT CONTRAST  TECHNIQUE: Contiguous axial images were obtained from the base of the skull through the vertex without intravenous contrast. COMPARISON:  09/06/2018 FINDINGS: Brain: Patchy chronic small vessel disease throughout the deep white matter. No acute intracranial abnormality. Specifically, no hemorrhage, hydrocephalus, mass lesion, acute infarction, or significant intracranial injury. Vascular: No hyperdense vessel or unexpected calcification. Skull: No acute calvarial abnormality. Sinuses/Orbits: Visualized paranasal sinuses and mastoids clear. Orbital soft tissues unremarkable. 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 No acute intracranial abnormality. Electronically Signed   By: Rolm Baptise M.D.   On: 12/19/2018 21:00   Ct Angio Chest Pe W And/or Wo Contrast  Result Date: 12/20/2018 CLINICAL DATA:  Nose bleed.  Hemoptysis. EXAM: CT ANGIOGRAPHY CHEST WITH CONTRAST TECHNIQUE: Multidetector CT imaging of the chest was performed using the standard protocol during bolus administration of intravenous contrast. Multiplanar CT image reconstructions and MIPs were obtained to evaluate the vascular anatomy. CONTRAST:  47mL OMNIPAQUE IOHEXOL 350 MG/ML SOLN COMPARISON:  11/21/2018 FINDINGS: Cardiovascular: Contrast injection is sufficient to demonstrate satisfactory opacification of the pulmonary arteries to the segmental level.Again identified are small subsegmental pulmonary emboli involving the left lower lobe, slightly improved from prior study. The main pulmonary artery is dilated measuring approximately 3.3 cm. Aortic calcifications are noted. The heart size is stable. Coronary artery calcifications are noted. Mediastinum/Nodes: --No mediastinal or hilar lymphadenopathy. --No axillary lymphadenopathy. --No supraclavicular lymphadenopathy. --Normal thyroid gland. --The esophagus is unremarkable Lungs/Pleura: There are worsening bibasilar airspace opacities. There is no pneumothorax. There are trace bilateral pleural effusions, right greater than left. The trachea is unremarkable. Upper Abdomen: No acute abnormality. Musculoskeletal: No chest wall abnormality. No acute or significant osseous findings. Review of the MIP images confirms the above findings. IMPRESSION: 1. Improving subsegmental pulmonary emboli involving the left lower lobe. 2. Worsening bibasilar airspace opacities which may represent atelectasis or infiltrates. 3. Trace bilateral pleural effusions. 4. Dilated main pulmonary artery which can be seen in patients with elevated pulmonary artery pressures. Aortic  Atherosclerosis (ICD10-I70.0). Electronically Signed   By: Constance Holster M.D.   On: 12/20/2018 01:09    ____________________________________________  PROCEDURES   Procedure(s) performed (including Critical Care):  Procedures  ____________________________________________  INITIAL IMPRESSION / MDM / [place] / ED COURSE  As part of my medical decision making, I reviewed the following data within the [place] was evaluated in Emergency Department on 12/20/2018 for the symptoms described in the history of present illness. She was evaluated in the context of the global COVID-19 pandemic, which necessitated consideration that the patient might be at risk for infection with the SARS-CoV-2 virus that causes COVID-19. Institutional protocols and algorithms that pertain to the evaluation of patients at risk for COVID-19 are in a state of rapid change based on information released by regulatory bodies including the CDC and federal and state organizations. These policies and algorithms were followed during the patient's care in the ED.  Some ED evaluations and interventions may be delayed as a result of limited staffing during the pandemic.*      Medical Decision Making: 78 year old female here with cough, shortness of breath, and gross hemoptysis.  Unclear whether this is secondary to recent epistaxis on Eliquis, though she does not report any overt aspiration.  She was noted to have grossly bloody hemoptysis here in the ED.  Will check CT angio, likely admit given the degree of hemoptysis on anticoagulation with underlying  severe COPD and recent PE.  No evidence of new heart strain or CHF clinically or on labs. Suspect this could be 2/2 pulmonary infarct from PE, versus bronchitis w/ mucosal irritation, versus PNA with hemoptysis in setting of anticoagulation use.   ____________________________________________  FINAL CLINICAL IMPRESSION(S) /  ED DIAGNOSES  Final diagnoses:  Hemoptysis  Chronic respiratory failure with hypoxia (HCC)  Chronic obstructive pulmonary disease, unspecified COPD type ([place])  Anticoagulated     MEDICATIONS GIVEN DURING THIS VISIT:  Medications  ipratropium-albuterol (DUONEB) 0.5-2.5 (3) MG/3ML nebulizer solution 3 mL (3 mLs Nebulization Given 12/19/18 2322)  iohexol (OMNIPAQUE) 350 MG/ML injection 60 mL (60 mLs Intravenous Contrast Given 12/20/18 0046)     ED Discharge Orders    None       Note:  This document was prepared using Dragon voice recognition software and may include unintentional dictation errors.   Duffy Bruce, MD 12/20/18 0200

## 2018-12-19 NOTE — ED Triage Notes (Signed)
Pt in via [facility] from [facility].  Per facility, pt with nosebleed yesterday, sent today for hemoptysis.  Hx of same.  Vitals WDL, NAD noted at this time.

## 2018-12-19 NOTE — ED Notes (Addendum)
Patient transported to X-ray, CT 

## 2018-12-20 ENCOUNTER — Emergency Department: Payer: Medicare HMO

## 2018-12-20 DIAGNOSIS — Z86711 Personal history of pulmonary embolism: Secondary | ICD-10-CM | POA: Diagnosis not present

## 2018-12-20 DIAGNOSIS — N1832 Chronic kidney disease, stage 3b: Secondary | ICD-10-CM | POA: Diagnosis present

## 2018-12-20 DIAGNOSIS — R042 Hemoptysis: Secondary | ICD-10-CM | POA: Diagnosis not present

## 2018-12-20 DIAGNOSIS — U071 COVID-19: Secondary | ICD-10-CM | POA: Diagnosis present

## 2018-12-20 DIAGNOSIS — J1289 Other viral pneumonia: Secondary | ICD-10-CM | POA: Diagnosis present

## 2018-12-20 DIAGNOSIS — N390 Urinary tract infection, site not specified: Secondary | ICD-10-CM | POA: Diagnosis present

## 2018-12-20 DIAGNOSIS — D631 Anemia in chronic kidney disease: Secondary | ICD-10-CM | POA: Diagnosis present

## 2018-12-20 DIAGNOSIS — I5032 Chronic diastolic (congestive) heart failure: Secondary | ICD-10-CM | POA: Diagnosis present

## 2018-12-20 DIAGNOSIS — Z8619 Personal history of other infectious and parasitic diseases: Secondary | ICD-10-CM | POA: Diagnosis not present

## 2018-12-20 DIAGNOSIS — E669 Obesity, unspecified: Secondary | ICD-10-CM | POA: Diagnosis present

## 2018-12-20 DIAGNOSIS — R040 Epistaxis: Secondary | ICD-10-CM | POA: Diagnosis present

## 2018-12-20 DIAGNOSIS — Z96651 Presence of right artificial knee joint: Secondary | ICD-10-CM | POA: Diagnosis present

## 2018-12-20 DIAGNOSIS — Z7901 Long term (current) use of anticoagulants: Secondary | ICD-10-CM | POA: Diagnosis not present

## 2018-12-20 DIAGNOSIS — D72819 Decreased white blood cell count, unspecified: Secondary | ICD-10-CM | POA: Diagnosis present

## 2018-12-20 DIAGNOSIS — E785 Hyperlipidemia, unspecified: Secondary | ICD-10-CM | POA: Diagnosis present

## 2018-12-20 DIAGNOSIS — Z1612 Extended spectrum beta lactamase (ESBL) resistance: Secondary | ICD-10-CM | POA: Diagnosis present

## 2018-12-20 DIAGNOSIS — G40909 Epilepsy, unspecified, not intractable, without status epilepticus: Secondary | ICD-10-CM | POA: Diagnosis present

## 2018-12-20 DIAGNOSIS — F329 Major depressive disorder, single episode, unspecified: Secondary | ICD-10-CM | POA: Diagnosis present

## 2018-12-20 DIAGNOSIS — E1122 Type 2 diabetes mellitus with diabetic chronic kidney disease: Secondary | ICD-10-CM | POA: Diagnosis present

## 2018-12-20 DIAGNOSIS — J440 Chronic obstructive pulmonary disease with acute lower respiratory infection: Secondary | ICD-10-CM | POA: Diagnosis present

## 2018-12-20 DIAGNOSIS — E559 Vitamin D deficiency, unspecified: Secondary | ICD-10-CM | POA: Diagnosis present

## 2018-12-20 DIAGNOSIS — I130 Hypertensive heart and chronic kidney disease with heart failure and stage 1 through stage 4 chronic kidney disease, or unspecified chronic kidney disease: Secondary | ICD-10-CM | POA: Diagnosis present

## 2018-12-20 DIAGNOSIS — Z8673 Personal history of transient ischemic attack (TIA), and cerebral infarction without residual deficits: Secondary | ICD-10-CM | POA: Diagnosis not present

## 2018-12-20 DIAGNOSIS — J9621 Acute and chronic respiratory failure with hypoxia: Secondary | ICD-10-CM | POA: Diagnosis present

## 2018-12-20 DIAGNOSIS — G9341 Metabolic encephalopathy: Secondary | ICD-10-CM | POA: Diagnosis present

## 2018-12-20 HISTORY — DX: Hemoptysis: R04.2

## 2018-12-20 LAB — CBC
HCT: 25.4 % — ABNORMAL LOW (ref 36.0–46.0)
Hemoglobin: 7.9 g/dL — ABNORMAL LOW (ref 12.0–15.0)
MCH: 26.2 pg (ref 26.0–34.0)
MCHC: 31.1 g/dL (ref 30.0–36.0)
MCV: 84.1 fL (ref 80.0–100.0)
Platelets: 265 10*3/uL (ref 150–400)
RBC: 3.02 MIL/uL — ABNORMAL LOW (ref 3.87–5.11)
RDW: 19.3 % — ABNORMAL HIGH (ref 11.5–15.5)
WBC: 2.9 10*3/uL — ABNORMAL LOW (ref 4.0–10.5)
nRBC: 0 % (ref 0.0–0.2)

## 2018-12-20 LAB — PROTIME-INR
INR: 1.3 — ABNORMAL HIGH (ref 0.8–1.2)
Prothrombin Time: 15.9 seconds — ABNORMAL HIGH (ref 11.4–15.2)

## 2018-12-20 LAB — TYPE AND SCREEN
ABO/RH(D): O POS
Antibody Screen: NEGATIVE

## 2018-12-20 LAB — LACTATE DEHYDROGENASE: LDH: 148 U/L (ref 98–192)

## 2018-12-20 LAB — GLUCOSE, CAPILLARY
Glucose-Capillary: 131 mg/dL — ABNORMAL HIGH (ref 70–99)
Glucose-Capillary: 88 mg/dL (ref 70–99)

## 2018-12-20 LAB — RETICULOCYTES
Immature Retic Fract: 34 % — ABNORMAL HIGH (ref 2.3–15.9)
RBC.: 3.02 MIL/uL — ABNORMAL LOW (ref 3.87–5.11)
Retic Count, Absolute: 77 10*3/uL (ref 19.0–186.0)
Retic Ct Pct: 2.6 % (ref 0.4–3.1)

## 2018-12-20 LAB — LACTIC ACID, PLASMA: Lactic Acid, Venous: 0.7 mmol/L (ref 0.5–1.9)

## 2018-12-20 LAB — FERRITIN: Ferritin: 63 ng/mL (ref 11–307)

## 2018-12-20 LAB — SARS CORONAVIRUS 2 (TAT 6-24 HRS): SARS Coronavirus 2: POSITIVE — AB

## 2018-12-20 LAB — PROCALCITONIN: Procalcitonin: <0.1 ng/mL

## 2018-12-20 LAB — APTT: aPTT: 39 seconds — ABNORMAL HIGH (ref 24–36)

## 2018-12-20 MED ORDER — ACETAMINOPHEN 325 MG PO TABS
650.0000 mg | ORAL_TABLET | Freq: Four times a day (QID) | ORAL | Status: DC | PRN
  Administered 2018-12-20: 650 mg via ORAL
  Filled 2018-12-20: qty 2

## 2018-12-20 MED ORDER — ONDANSETRON HCL 4 MG PO TABS: 4.0000 mg | ORAL_TABLET | Freq: Four times a day (QID) | ORAL | Status: DC | PRN

## 2018-12-20 MED ORDER — DEXAMETHASONE SODIUM PHOSPHATE 10 MG/ML IJ SOLN
6.0000 mg | INTRAMUSCULAR | Status: DC
  Administered 2018-12-20: 17:00:00 6 mg via INTRAVENOUS
  Filled 2018-12-20: qty 1

## 2018-12-20 MED ORDER — ZINC SULFATE 220 (50 ZN) MG PO CAPS
220.0000 mg | ORAL_CAPSULE | Freq: Every day | ORAL | Status: DC
  Administered 2018-12-20 – 2018-12-21 (×2): 220 mg via ORAL
  Filled 2018-12-20 (×2): qty 1

## 2018-12-20 MED ORDER — OXYMETAZOLINE HCL 0.05 % NA SOLN
2.0000 | Freq: Two times a day (BID) | NASAL | Status: DC
  Administered 2018-12-20 – 2018-12-21 (×2): 2 via NASAL
  Filled 2018-12-20 (×2): qty 30

## 2018-12-20 MED ORDER — SODIUM CHLORIDE 0.9 % IV SOLN
200.0000 mg | Freq: Once | INTRAVENOUS | Status: AC
  Administered 2018-12-20: 200 mg via INTRAVENOUS
  Filled 2018-12-20: qty 40

## 2018-12-20 MED ORDER — IOHEXOL 350 MG/ML SOLN
60.0000 mL | Freq: Once | INTRAVENOUS | Status: AC | PRN
  Administered 2018-12-20: 60 mL via INTRAVENOUS

## 2018-12-20 MED ORDER — PANTOPRAZOLE SODIUM 40 MG PO TBEC
40.0000 mg | DELAYED_RELEASE_TABLET | Freq: Every day | ORAL | Status: DC
  Administered 2018-12-20 – 2018-12-21 (×2): 40 mg via ORAL
  Filled 2018-12-20 (×2): qty 1

## 2018-12-20 MED ORDER — IPRATROPIUM BROMIDE 0.03 % NA SOLN
2.0000 | Freq: Two times a day (BID) | NASAL | Status: DC
  Administered 2018-12-20 – 2018-12-21 (×2): 2 via NASAL
  Filled 2018-12-20: qty 30

## 2018-12-20 MED ORDER — SALINE SPRAY 0.65 % NA SOLN
1.0000 | NASAL | Status: DC | PRN
  Administered 2018-12-20: 17:00:00 1 via NASAL
  Filled 2018-12-20: qty 44

## 2018-12-20 MED ORDER — SODIUM CHLORIDE 0.9 % IV SOLN: 1.0000 g | INTRAVENOUS | Status: DC

## 2018-12-20 MED ORDER — DOCUSATE SODIUM 100 MG PO CAPS
100.0000 mg | ORAL_CAPSULE | Freq: Two times a day (BID) | ORAL | Status: DC
  Administered 2018-12-21: 100 mg via ORAL
  Filled 2018-12-20: qty 1

## 2018-12-20 MED ORDER — IPRATROPIUM-ALBUTEROL 20-100 MCG/ACT IN AERS
1.0000 | INHALATION_SPRAY | Freq: Four times a day (QID) | RESPIRATORY_TRACT | Status: DC
  Administered 2018-12-20 – 2018-12-21 (×3): 1 via RESPIRATORY_TRACT
  Filled 2018-12-20: qty 4

## 2018-12-20 MED ORDER — INSULIN ASPART 100 UNIT/ML ~~LOC~~ SOLN
0.0000 [IU] | SUBCUTANEOUS | Status: DC
  Administered 2018-12-20: 2 [IU] via SUBCUTANEOUS
  Filled 2018-12-20: qty 1

## 2018-12-20 MED ORDER — ONDANSETRON HCL 4 MG/2ML IJ SOLN: 4.0000 mg | Freq: Four times a day (QID) | INTRAMUSCULAR | Status: DC | PRN

## 2018-12-20 MED ORDER — ACETAMINOPHEN 650 MG RE SUPP: 650.0000 mg | Freq: Four times a day (QID) | RECTAL | Status: DC | PRN

## 2018-12-20 MED ORDER — FLUOXETINE HCL 20 MG PO CAPS
40.0000 mg | ORAL_CAPSULE | Freq: Every day | ORAL | Status: DC
  Administered 2018-12-21: 40 mg via ORAL
  Filled 2018-12-20: qty 2

## 2018-12-20 MED ORDER — LORATADINE 10 MG PO TABS
10.0000 mg | ORAL_TABLET | Freq: Every day | ORAL | Status: DC
  Administered 2018-12-20 – 2018-12-21 (×2): 10 mg via ORAL
  Filled 2018-12-20 (×2): qty 1

## 2018-12-20 MED ORDER — GUAIFENESIN-DM 100-10 MG/5ML PO SYRP
10.0000 mL | ORAL_SOLUTION | ORAL | Status: DC | PRN
  Filled 2018-12-20: qty 10

## 2018-12-20 MED ORDER — IPRATROPIUM-ALBUTEROL 0.5-2.5 (3) MG/3ML IN SOLN: 3.0000 mL | Freq: Three times a day (TID) | RESPIRATORY_TRACT | Status: DC

## 2018-12-20 MED ORDER — SENNOSIDES-DOCUSATE SODIUM 8.6-50 MG PO TABS: 1.0000 | ORAL_TABLET | Freq: Every evening | ORAL | Status: DC | PRN

## 2018-12-20 MED ORDER — HYDROCOD POLST-CPM POLST ER 10-8 MG/5ML PO SUER: 5.0000 mL | Freq: Two times a day (BID) | ORAL | Status: DC | PRN

## 2018-12-20 MED ORDER — GABAPENTIN 300 MG PO CAPS
300.0000 mg | ORAL_CAPSULE | Freq: Every day | ORAL | Status: DC
  Administered 2018-12-20: 22:00:00 300 mg via ORAL
  Filled 2018-12-20: qty 1

## 2018-12-20 MED ORDER — UMECLIDINIUM BROMIDE 62.5 MCG/INH IN AEPB
1.0000 | INHALATION_SPRAY | Freq: Every day | RESPIRATORY_TRACT | Status: DC
  Filled 2018-12-20: qty 7

## 2018-12-20 MED ORDER — TRAMADOL HCL 50 MG PO TABS
50.0000 mg | ORAL_TABLET | Freq: Four times a day (QID) | ORAL | Status: DC | PRN
  Administered 2018-12-20 (×2): 50 mg via ORAL
  Filled 2018-12-20 (×2): qty 1

## 2018-12-20 MED ORDER — AZITHROMYCIN 500 MG PO TABS
500.0000 mg | ORAL_TABLET | Freq: Once | ORAL | Status: AC
  Administered 2018-12-20: 03:00:00 500 mg via ORAL
  Filled 2018-12-20: qty 1

## 2018-12-20 MED ORDER — ACETAMINOPHEN 500 MG PO TABS
1000.0000 mg | ORAL_TABLET | Freq: Once | ORAL | Status: AC
  Administered 2018-12-20: 1000 mg via ORAL
  Filled 2018-12-20: qty 2

## 2018-12-20 MED ORDER — LEVETIRACETAM ER 500 MG PO TB24
500.0000 mg | ORAL_TABLET | Freq: Every day | ORAL | Status: DC
  Administered 2018-12-21: 500 mg via ORAL
  Filled 2018-12-20: qty 1

## 2018-12-20 MED ORDER — SODIUM CHLORIDE 0.9 % IV SOLN
1.0000 g | Freq: Two times a day (BID) | INTRAVENOUS | Status: DC
  Administered 2018-12-20 – 2018-12-21 (×2): 1 g via INTRAVENOUS
  Filled 2018-12-20 (×3): qty 1

## 2018-12-20 MED ORDER — MECLIZINE HCL 25 MG PO TABS: 25.0000 mg | ORAL_TABLET | Freq: Two times a day (BID) | ORAL | Status: DC | PRN

## 2018-12-20 MED ORDER — FLUTICASONE-UMECLIDIN-VILANT 100-62.5-25 MCG/INH IN AEPB: 1.0000 | INHALATION_SPRAY | Freq: Every day | RESPIRATORY_TRACT | Status: DC

## 2018-12-20 MED ORDER — SODIUM CHLORIDE 0.9 % IV SOLN: 2.0000 g | INTRAVENOUS | Status: DC

## 2018-12-20 MED ORDER — ATORVASTATIN CALCIUM 20 MG PO TABS
80.0000 mg | ORAL_TABLET | Freq: Every evening | ORAL | Status: DC
  Administered 2018-12-20: 18:00:00 80 mg via ORAL
  Filled 2018-12-20: qty 4

## 2018-12-20 MED ORDER — SODIUM CHLORIDE 0.9 % IV SOLN
1.0000 g | Freq: Once | INTRAVENOUS | Status: AC
  Administered 2018-12-20: 1 g via INTRAVENOUS
  Filled 2018-12-20: qty 10

## 2018-12-20 MED ORDER — VITAMIN C 500 MG PO TABS
500.0000 mg | ORAL_TABLET | Freq: Every day | ORAL | Status: DC
  Administered 2018-12-20 – 2018-12-21 (×2): 500 mg via ORAL
  Filled 2018-12-20 (×2): qty 1

## 2018-12-20 MED ORDER — HEPARIN (PORCINE) 25000 UT/250ML-% IV SOLN
1200.0000 [IU]/h | INTRAVENOUS | Status: DC
  Administered 2018-12-20: 1200 [IU]/h via INTRAVENOUS
  Filled 2018-12-20 (×2): qty 250

## 2018-12-20 MED ORDER — AZITHROMYCIN 500 MG PO TABS: 500.0000 mg | ORAL_TABLET | Freq: Every day | ORAL | Status: DC

## 2018-12-20 MED ORDER — FLUTICASONE FUROATE-VILANTEROL 100-25 MCG/INH IN AEPB
1.0000 | INHALATION_SPRAY | Freq: Every day | RESPIRATORY_TRACT | Status: DC
  Filled 2018-12-20: qty 28

## 2018-12-20 MED ORDER — ALBUTEROL SULFATE (2.5 MG/3ML) 0.083% IN NEBU: 3.0000 mL | INHALATION_SOLUTION | Freq: Three times a day (TID) | RESPIRATORY_TRACT | Status: DC | PRN

## 2018-12-20 MED ORDER — VANCOMYCIN HCL 10 G IV SOLR
2000.0000 mg | Freq: Once | INTRAVENOUS | Status: DC
  Filled 2018-12-20: qty 2000

## 2018-12-20 NOTE — H&P (Addendum)
[facility] History and Physical   Patient: Katherine [name] [id number]   PCP: Valerie Roys, DO DOB: 06-22-1940   DOA: 12/19/2018   DOS: 12/20/2018   DOS: the patient was seen and examined on 12/20/2018  Patient coming from: The patient is coming from  is a resident of [facility]  Chief Complaint: Nose bleeding as well as cough with blood  HPI: Katherine [name] is a 78 y.o. female with Past medical history of CKD stage III, COPD, type II DM, GERD, HTN, obesity, seizures, PE on anticoagulation, CVA. Presented with complaints of epistaxis as well as hemoptysis. Patient resident of [facility].  Patient started having nosebleed on 12/18/2018.  12/19/2018 started having complaints of Cough with blood.  Patient denies any chest pain does have chronic shortness of breath.  Denies any fever or chills.  Denies any nausea or vomiting.  Denies any diarrhea or constipation. Also denies any bleeding from anywhere else. She has some chronic swelling of the legs. No abdominal pain. She denies having any episodes of bleeding like this before. She uses chronic oxygen but does not remember that she uses humidity or not. Her chart dictates that she is on Afrin spray although patient does not remember being on this medication or using this medication at all. Patient is currently on Eliquis for her recent PE. Her diagnosis was on November 21, 2018.  ED Course: Presents with above complaint.  CT angio chest PE protocol was performed.  That shows that she still has bibasilar pneumonia as well as evidence of improvement in her pulmonary embolism. Patient continues to have some low volume hemoptysis but no clots.  At her baseline ambulates with assistance independent for most of her ADL;  manages her medication on her own.  Review of Systems: as mentioned in the history of present illness.  All other systems reviewed and are negative.  Past Medical History:  Diagnosis Date    Angioedema    Anxiety and depression    CHF (congestive heart failure) (HCC)    Chronic constipation    Chronic kidney disease    stage 3   COPD (chronic obstructive pulmonary disease) (HCC)    Diabetes mellitus without complication (HCC)    Gallstones    GERD (gastroesophageal reflux disease)    Hypertension    Left ventricular hypertrophy    Osteoarthritis    Prurigo nodularis    Seizures ([place])    Stroke ([place])    Tobacco abuse    Vitamin D deficiency disease    Past Surgical History:  Procedure Laterality Date   CHOLECYSTECTOMY     POLYPECTOMY  11/2011   vocal cord   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   Procedure: TOTAL KNEE ARTHROPLASTY;  Surgeon: Hessie Knows, MD;  Location: [facility] ORS;  Service: Orthopedics;  Laterality: Right;   Social History:  reports that she has quit smoking. Her smoking use included cigarettes. She has a 30.00 pack-year smoking history. She has never used smokeless tobacco. She reports that she does not drink alcohol or use drugs.  Allergies  Allergen Reactions   Bee Venom Swelling   Enalapril Maleate Swelling   Family history reviewed and not pertinent Family History  Problem Relation Age of Onset   Alcohol abuse Mother    Sickle cell anemia Daughter    Hypertension Son    Cancer Neg Hx    COPD Neg Hx    Diabetes Neg Hx    Heart disease Neg Hx    Stroke  Neg Hx      Prior to Admission medications   Medication Sig Start Date End Date Taking? Authorizing Provider  acetaminophen (TYLENOL) 650 MG CR tablet Take 650 mg by mouth 3 (three) times daily.    Yes [provider]  albuterol (ACCUNEB) 0.63 MG/3ML nebulizer solution Take 3 mLs by nebulization every 8 (eight) hours as needed for wheezing.    Yes [provider]  apixaban (ELIQUIS) 5 MG TABS tablet Take 1 tablet (5 mg total) by mouth 2 (two) times daily. To initiate this on 11/29/2018 after completing the 10 mg twice daily treatment  regimen 11/29/18  Yes Ojie, Jude, MD  atorvastatin (LIPITOR) 80 MG tablet Take 80 mg by mouth every evening.    Yes [provider]  cetirizine (ZYRTEC) 10 MG tablet Take 5 mg by mouth at bedtime.   Yes [provider]  cholecalciferol (VITAMIN D) 1000 units tablet Take 1,000 Units by mouth daily.   Yes [provider]  diclofenac sodium (VOLTAREN) 1 % GEL Apply 2 g topically every 12 (twelve) hours as needed (for left knee pain).    Yes [provider]  docusate sodium (COLACE) 100 MG capsule Take 100 mg by mouth 2 (two) times daily.   Yes [provider]  ertapenem (INVANZ) 1 g injection Inject 1 g into the muscle daily. 12/19/18 12/29/18 Yes [provider]  FLUoxetine (PROZAC) 40 MG capsule Take 40 mg by mouth daily.    Yes [provider]  Fluticasone-Umeclidin-Vilant 100-62.5-25 MCG/INH AEPB Inhale 1 puff into the lungs daily.   Yes [provider]  gabapentin (NEURONTIN) 300 MG capsule Take 300 mg by mouth at bedtime.   Yes [provider]  hydrALAZINE (APRESOLINE) 25 MG tablet Take 25 mg by mouth 3 (three) times daily. 06/19/18  Yes [provider]  ipratropium (ATROVENT) 0.03 % nasal spray Place 2 sprays into both nostrils 2 (two) times daily.    Yes [provider]  ipratropium-albuterol (DUONEB) 0.5-2.5 (3) MG/3ML SOLN Take 3 mLs by nebulization 3 (three) times daily. 07/17/18  Yes Gladstone Lighter, MD  levETIRAcetam (KEPPRA XR) 500 MG 24 hr tablet Take 1 tablet (500 mg total) by mouth daily. 11/19/17  Yes McCue, Janett Billow, NP  linagliptin (TRADJENTA) 5 MG TABS tablet Take 5 mg by mouth daily.   Yes [provider]  meclizine (ANTIVERT) 25 MG tablet Take 25 mg by mouth every 12 (twelve) hours as needed for dizziness.    Yes [provider]  Multiple Vitamin (MULTIVITAMIN WITH MINERALS) TABS tablet Take 1 tablet by mouth daily.   Yes [provider]  mupirocin ointment  (BACTROBAN) 2 % Place 1 application into the nose 2 (two) times daily. 06/13/18  Yes Wieting, Richard, MD  nitrofurantoin, macrocrystal-monohydrate, (MACROBID) 100 MG capsule Take 100 mg by mouth 2 (two) times daily. 12/19/18 12/25/18 Yes [provider]  nitroGLYCERIN (NITROSTAT) 0.4 MG SL tablet Place 0.4 mg under the tongue every 5 (five) minutes as needed for chest pain.  08/07/18  Yes [provider]  oxymetazoline (AFRIN) 0.05 % nasal spray Place 1 spray into both nostrils 2 (two) times daily. 12/18/18 12/22/18 Yes [provider]  pantoprazole (PROTONIX) 40 MG tablet Take 1 tablet (40 mg total) by mouth daily. 04/29/18  Yes Bettey Costa, MD  vitamin B-12 1000 MCG tablet Take 1 tablet (1,000 mcg total) by mouth daily. 04/30/18  Yes Mody, Ulice Bold, MD  apixaban (ELIQUIS) 5 MG TABS tablet Take 2 tablets (  10 mg total) by mouth 2 (two) times daily for 5 doses. Patient not taking: Reported on 12/19/2018 11/26/18 12/19/18  Otila Back, MD  LORazepam (ATIVAN) 2 MG/ML injection Inject 0.5 mLs (1 mg total) into the muscle daily as needed for seizure. Patient not taking: Reported on 12/19/2018 11/26/18   Otila Back, MD    Physical Exam: Vitals:   12/20/18 1115 12/20/18 1130 12/20/18 1145 12/20/18 1200  BP:  131/79  135/83  Pulse: 84 85 80 79  Resp: (!) 21 19 18  (!) 21  Temp:      TempSrc:      SpO2: 98% 98% 99% 99%  Weight:      Height:        General: alert and oriented to time, place, and person. Appear in moderate distress, affect appropriate Eyes: PERRL, Conjunctiva normal ENT: Oral Mucosa Clear, moist  Neck: difficult to assess JVD, no Abnormal Mass Or lumps, chronically hoarse voice Cardiovascular: S1 and S2 Present, no Murmur, peripheral pulses symmetrical Respiratory: increased respiratory effort, Bilateral Air entry equal and Decreased, no signs of accessory muscle use, basal Crackles, no wheezes Abdomen: Bowel Sound present, Soft and no tenderness, no hernia Skin: no  rashes  Extremities: no Pedal edema, no calf tenderness Neurologic: without any new focal findings Gait not checked due to patient safety concerns  Data Reviewed: I have personally reviewed and interpreted labs, imaging as discussed below.  CBC: Recent Labs  Lab 12/19/18 2125 12/20/18 0947  WBC 2.9* 2.9*  NEUTROABS 1.6*  --   HGB 8.0* 7.9*  HCT 25.4* 25.4*  MCV 83.3 84.1  PLT 235 [id number]   Basic Metabolic Panel: Recent Labs  Lab 12/19/18 2125  NA 136  K 4.1  CL 105  CO2 24  GLUCOSE 97  BUN 19  CREATININE 1.45*  CALCIUM 8.6*   GFR: Estimated Creatinine Clearance: 37.1 mL/min (A) (by C-G formula based on SCr of 1.45 mg/dL (H)). Liver Function Tests: Recent Labs  Lab 12/19/18 2125  AST 89*  ALT 33  ALKPHOS 257*  BILITOT 0.8  PROT 6.8  ALBUMIN 2.1*   No results for input(s): LIPASE, AMYLASE in the last 168 hours. No results for input(s): AMMONIA in the last 168 hours. Coagulation Profile: Recent Labs  Lab 12/19/18 2125 12/20/18 0947  INR 1.4* 1.3*   Cardiac Enzymes: No results for input(s): CKTOTAL, CKMB, CKMBINDEX, TROPONINI in the last 168 hours. BNP (last 3 results) No results for input(s): PROBNP in the last 8760 hours. HbA1C: No results for input(s): HGBA1C in the last 72 hours. CBG: No results for input(s): GLUCAP in the last 168 hours. Lipid Profile: No results for input(s): CHOL, HDL, LDLCALC, TRIG, CHOLHDL, LDLDIRECT in the last 72 hours. Thyroid Function Tests: No results for input(s): TSH, T4TOTAL, FREET4, T3FREE, THYROIDAB in the last 72 hours. Anemia Panel: Recent Labs    12/20/18 0947  RETICCTPCT 2.6   Urine analysis:    Component Value Date/Time   COLORURINE YELLOW (A) 09/06/2018 1222   APPEARANCEUR CLOUDY (A) 09/06/2018 1222   APPEARANCEUR Clear 02/20/2013 1459   LABSPEC 1.014 09/06/2018 1222   LABSPEC 1.012 02/20/2013 1459   PHURINE 5.0 09/06/2018 1222   GLUCOSEU NEGATIVE 09/06/2018 1222   GLUCOSEU Negative 02/20/2013 1459    HGBUR NEGATIVE 09/06/2018 1222   BILIRUBINUR NEGATIVE 09/06/2018 1222   BILIRUBINUR Negative 02/20/2013 1459   KETONESUR NEGATIVE 09/06/2018 1222   PROTEINUR NEGATIVE 09/06/2018 1222   NITRITE NEGATIVE 09/06/2018 1222   LEUKOCYTESUR LARGE (A) 09/06/2018 1222  LEUKOCYTESUR Negative 02/20/2013 1459    Radiological Exams on Admission: Dg Chest 2 View  Result Date: 12/19/2018 CLINICAL DATA:  Hemoptysis, cough EXAM: CHEST - 2 VIEW COMPARISON:  11/24/2018 FINDINGS: Increased markings in the lower lungs, likely scarring, similar to prior study. Heart is borderline in size. Aortic atherosclerosis. No acute airspace opacities or effusions. No acute bony abnormality. IMPRESSION: Bibasilar linear densities, likely scarring. No definite acute process. Electronically Signed   By: Rolm Baptise M.D.   On: 12/19/2018 20:59   Ct Head Wo Contrast  Result Date: 12/19/2018 CLINICAL DATA:  Headache, on Eliquis EXAM: CT HEAD WITHOUT CONTRAST TECHNIQUE: Contiguous axial images were obtained from the base of the skull through the vertex without intravenous contrast. COMPARISON:  09/06/2018 FINDINGS: Brain: Patchy chronic small vessel disease throughout the deep white matter. No acute intracranial abnormality. Specifically, no hemorrhage, hydrocephalus, mass lesion, acute infarction, or significant intracranial injury. Vascular: No hyperdense vessel or unexpected calcification. Skull: No acute calvarial abnormality. Sinuses/Orbits: Visualized paranasal sinuses and mastoids clear. Orbital soft tissues unremarkable. 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 No acute intracranial abnormality. Electronically Signed   By: Rolm Baptise M.D.   On: 12/19/2018 21:00   Ct Angio Chest Pe W And/or Wo Contrast  Result Date: 12/20/2018 CLINICAL DATA:  Nose bleed.  Hemoptysis. EXAM: CT ANGIOGRAPHY CHEST WITH CONTRAST TECHNIQUE: Multidetector CT imaging of the chest was performed  using the standard protocol during bolus administration of intravenous contrast. Multiplanar CT image reconstructions and MIPs were obtained to evaluate the vascular anatomy. CONTRAST:  10mL OMNIPAQUE IOHEXOL 350 MG/ML SOLN COMPARISON:  11/21/2018 FINDINGS: Cardiovascular: Contrast injection is sufficient to demonstrate satisfactory opacification of the pulmonary arteries to the segmental level.Again identified are small subsegmental pulmonary emboli involving the left lower lobe, slightly improved from prior study. The main pulmonary artery is dilated measuring approximately 3.3 cm. Aortic calcifications are noted. The heart size is stable. Coronary artery calcifications are noted. Mediastinum/Nodes: --No mediastinal or hilar lymphadenopathy. --No axillary lymphadenopathy. --No supraclavicular lymphadenopathy. --Normal thyroid gland. --The esophagus is unremarkable Lungs/Pleura: There are worsening bibasilar airspace opacities. There is no pneumothorax. There are trace bilateral pleural effusions, right greater than left. The trachea is unremarkable. Upper Abdomen: No acute abnormality. Musculoskeletal: No chest wall abnormality. No acute or significant osseous findings. Review of the MIP images confirms the above findings. IMPRESSION: 1. Improving subsegmental pulmonary emboli involving the left lower lobe. 2. Worsening bibasilar airspace opacities which may represent atelectasis or infiltrates. 3. Trace bilateral pleural effusions. 4. Dilated main pulmonary artery which can be seen in patients with elevated pulmonary artery pressures. Aortic Atherosclerosis (ICD10-I70.0). Electronically Signed   By: Constance Holster M.D.   On: 12/20/2018 01:09   EKG: Independently reviewed. normal sinus rhythm, nonspecific ST and T waves changes. Echocardiogram: June 2020, EF 60 to 65%,  I reviewed all nursing notes, pharmacy notes, vitals, pertinent old records.  Assessment/Plan 1.  Hemoptysis/epistaxis Presents with  complaints of cough with blood although appears more likely this is a epistaxis. Also appears very low volume. Baseline hemoglobin around 8.  Current hemoglobin 8.07.9. Reticulocyte count not significantly elevated. Oxygenation no different from her baseline to baseline. Chest x-ray and a CT PE protocol does not show any evidence of any new worsening. At present I do not feel that the patient's hemoptysis or epistaxis is in a significant amount. Primary suspicion as an etiology of patient's epistaxis is oxygen use without humidity. Will use Afrin spray as well  as [employer]. He will be added to the oxygen already. Given patient's recent PE diagnosis we will start her on IV heparin without bolus and monitor.   2.  Pulmonary embolism Recently diagnosed in October 2020. Started on Eliquis. We will transition to heparin for close observation and if there is no further significant hemoptysis then transition back to Eliquis.  3.  Possible healthcare associated pneumonia. We will treat with IV Invanz and vancomycin although suspicion of pneumonia is very low. Follow-up on work-up that is initiated. Currently PUI pending Covid test.  4.  Severe COPD. Occasional wheezing. Continue home regimen for now.  5.  Dyslipidemia. Continue Lipitor.  6.  History of ESBL. Patient is on Invanz 1 g daily, started on this medicine in the rehab on 12/19/2018.  7.  Mood disorder. Seizure disorder Continue gabapentin continue Prozac. Continue Keppra  8 GERD prevention continue PPI.  Addendum: Acute COVID-19 Viral illness Patient Covid test came back positive on 12/20/2018.  Given that the patient does have concern for epistaxis and hemoptysis we will continue to monitor the patient at [facility] overnight before transitioning to oral Eliquis and then transfer to [facility] if hospitalization is indicated. Lab Results  Component Value Date   SARSCOV2NAA POSITIVE (A) 12/19/2018   [place] NOT DETECTED  11/24/2018   [place] NEGATIVE 11/21/2018   [place] NEGATIVE 09/06/2018   CT chest: GGO, consolidation  Tmax last 24 hours:  Temp (24hrs), Avg:98.1 F (36.7 C), Min:98.1 F (36.7 C), Max:98.1 F (36.7 C)   Oxygen requirements: On 2 LPM chronically  Antibiotics: On IV meropenem for ESBL UTI Diuretics: None Vitamin C and Zinc: Started on 12/20/2018 DVT Prophylaxis: Therapeutic Anticoagulation with Heparin. Remdesivir: Started on 12/20/2018 Steroids: Started on 12/20/2018 Actemra: No indication for now.  Prone positioning: Patient encouraged to stay in prone position as much as possible.  PPE During this encounter: Patient Isolation: Airborne + Droplet + Contact HCP PPE: CAPR, gown. gloves Patient PPE: None  The treatment plan and use of medications and known side effects were discussed with patient/family. It was clearly explained that there is no proven definitive treatment for COVID-19 infection yet. Any medications used here are based on case reports/anecdotal data which are not peer-reviewed and has not been studied using randomized control trials.  Complete risks and long-term side effects are unknown, however in the best clinical judgment they seem to be of some clinical benefit rather than medical risks.  Patient/family agree with the treatment plan and want to receive these treatments as indicated.    I will also discontinue the IV vancomycin initiated for healthcare associated pneumonia concerns given negative procalcitonin levels.  Berle Mull 3:20 PM 12/20/2018    Nutrition: Cardiac diet DVT Prophylaxis: Therapeutic Anticoagulation with Heparin  Advance goals of care discussion: Full code  Consults: none   Family Communication: no family was present at bedside, at the time of interview.   Disposition: Admitted as inpatient, telemetry unit. Likely to be discharged SNF, in 2 days.  I have discussed plan of care as described above with RN and  patient/family.  Author: Berle Mull, MD [facility] 12/20/2018 2:13 PM   To reach On-call, see care teams to locate the attending and reach out to them via [email or web address]. If 7PM-7AM, please contact night-coverage If you still have difficulty reaching the attending provider, please page the [facility] (Director on Call) for [facility] on amion for assistance.

## 2018-12-20 NOTE — ED Notes (Signed)
To patient room, coughing up moderate amount bright red blood.  Oral suction given to patient.  HOB elevated.  Dr. Kerman Passey notified and to bedside to evaluate patient.  Continue to monitor.

## 2018-12-20 NOTE — Consult Note (Signed)
Remdesivir - Pharmacy Brief Note   O:  ALT: 33 CXR: bibasilar airspace opacities SpO2: Chronically on 2L. Sats 98-100%   A/P:  Remdesivir 200 mg IVPB once followed by 100 mg IVPB daily x 4 days.   [place] Resident 12/20/2018 4:00 PM

## 2018-12-20 NOTE — Consult Note (Signed)
ANTICOAGULATION CONSULT NOTE - Initial Consult  Pharmacy Consult for Heparin Indication: pulmonary embolus  Allergies  Allergen Reactions  . Bee Venom Swelling  . Enalapril Maleate Swelling    Patient Measurements: Height: 5\' 3"  (160 cm) Weight: 226 lb (102.5 kg) IBW/kg (Calculated) : 52.4 Heparin Dosing Weight: 76.6  Vital Signs: BP: 128/84 (11/06 1530) Pulse Rate: 80 (11/06 1530)  Labs: Recent Labs    12/19/18 2125 12/20/18 0947  HGB 8.0* 7.9*  HCT 25.4* 25.4*  PLT 235 265  APTT  --  39*  LABPROT 17.3* 15.9*  INR 1.4* 1.3*  CREATININE 1.45*  --     Estimated Creatinine Clearance: 37.1 mL/min (A) (by C-G formula based on SCr of 1.45 mg/dL (H)).   Medical History: Past Medical History:  Diagnosis Date  . Angioedema   . Anxiety and depression   . CHF (congestive heart failure) ([place])   . Chronic constipation   . Chronic kidney disease    stage 3  . COPD (chronic obstructive pulmonary disease) ([place])   . Diabetes mellitus without complication ([place])   . Gallstones   . GERD (gastroesophageal reflux disease)   . Hypertension   . Left ventricular hypertrophy   . Osteoarthritis   . Prurigo nodularis   . Seizures ([place])   . Stroke ([place])   . Tobacco abuse   . Vitamin D deficiency disease     Medications:  Apixaban prior to admission (last dose 11/5 AM per facility)  Assessment: Patient is a 78 y/o F with recently diagnosed PE in October on apixaban prior to admission. Patient presenting c/o hemoptysis and epistaxis. COVID + diagnosis at hospital. Pharmacy consulted for heparin drip for PE.   Anemic with Hgb of 7.9 g/dL however baseline appears to be 8-9 g/dL. INR slightly elevated to 1.3 and aPTT 39. Heparin level pending.   Goal of Therapy:  aPTT 66-102 seconds Monitor platelets by anticoagulation protocol: Yes   Plan:  -Heparin infusion at 1200 units/hr, no bolus in view of recent bleeding -aPTT 8 hours after initiation of infusion. Will follow aPTT  for now considering apixaban interference with heparin level.  -Daily CBC per protocol  [place] Resident 12/20/2018,4:17 PM

## 2018-12-20 NOTE — ED Provider Notes (Signed)
Patient CT scan with evidence of blood clot that is getting better.  There was concern for possible infiltrate and given her white count of 2.9 we will start patient on ceftriaxone and is the throat.  Per Dr. Ellender Hose plan will discuss with the hospital team for admission for trending hemoglobins.     Vanessa Odin, MD 12/20/18 [id number]

## 2018-12-20 NOTE — Consult Note (Signed)
Pharmacy Antibiotic Note  Katherine [name] is a 78 y.o. female admitted on 12/19/2018 with pneumonia. Patient presented to [facility] 11/5 c/o epistaxis and hemoptysis. Recent diagnosis of PE in October on apixaban prior to admission.  CT angio concerning for pneumonia. Patient was to start ertapenem at facility for ESBL UTI but no doses were administered. Pharmacy has been consulted for meropenem dosing for HAP/ESBL UTI. Creatinine appears to be consistent with patient's baseline.   Plan: Meropenem 1 g q12h (renally adjusted)  Continue to monitor renal function and adjust antibiotics as indicated.  Height: 5\' 3"  (160 cm) Weight: 226 lb (102.5 kg) IBW/kg (Calculated) : 52.4  Temp (24hrs), Avg:98.1 F (36.7 C), Min:98.1 F (36.7 C), Max:98.1 F (36.7 C)  Recent Labs  Lab 12/19/18 2125 12/20/18 0009 12/20/18 0947  WBC 2.9*  --  2.9*  CREATININE 1.45*  --   --   LATICACIDVEN 1.0 0.7  --     Estimated Creatinine Clearance: 37.1 mL/min (A) (by C-G formula based on SCr of 1.45 mg/dL (H)).    Allergies  Allergen Reactions  . Bee Venom Swelling  . Enalapril Maleate Swelling    Antimicrobials this admission: Ceftriaxone x 1 Azithromycin x 1  Meropenem 11/6 >>   Dose adjustments this admission: N/A  Microbiology results: 11/6 MRSA PCR: pending 11/6 COVID-19: positive  Thank you for allowing pharmacy to be a part of this patient's care.  [place] Resident 12/20/2018 2:36 PM

## 2018-12-21 ENCOUNTER — Inpatient Hospital Stay (HOSPITAL_COMMUNITY)
Admission: AD | Admit: 2018-12-21 | Discharge: 2018-12-31 | DRG: 177 | Disposition: A | Discharge status: Skilled Nursing Facility | Payer: Medicare HMO | Source: Other Acute Inpatient Hospital | Attending: Internal Medicine | Admitting: Internal Medicine

## 2018-12-21 DIAGNOSIS — R0602 Shortness of breath: Secondary | ICD-10-CM | POA: Diagnosis not present

## 2018-12-21 DIAGNOSIS — K529 Noninfective gastroenteritis and colitis, unspecified: Secondary | ICD-10-CM | POA: Diagnosis not present

## 2018-12-21 DIAGNOSIS — R042 Hemoptysis: Secondary | ICD-10-CM | POA: Diagnosis present

## 2018-12-21 DIAGNOSIS — E1122 Type 2 diabetes mellitus with diabetic chronic kidney disease: Secondary | ICD-10-CM | POA: Diagnosis present

## 2018-12-21 DIAGNOSIS — J1289 Other viral pneumonia: Secondary | ICD-10-CM | POA: Diagnosis not present

## 2018-12-21 DIAGNOSIS — R569 Unspecified convulsions: Secondary | ICD-10-CM | POA: Diagnosis present

## 2018-12-21 DIAGNOSIS — Z96651 Presence of right artificial knee joint: Secondary | ICD-10-CM | POA: Diagnosis present

## 2018-12-21 DIAGNOSIS — Z8673 Personal history of transient ischemic attack (TIA), and cerebral infarction without residual deficits: Secondary | ICD-10-CM

## 2018-12-21 DIAGNOSIS — Z8249 Family history of ischemic heart disease and other diseases of the circulatory system: Secondary | ICD-10-CM

## 2018-12-21 DIAGNOSIS — Z6841 Body Mass Index (BMI) 40.0 and over, adult: Secondary | ICD-10-CM

## 2018-12-21 DIAGNOSIS — J690 Pneumonitis due to inhalation of food and vomit: Secondary | ICD-10-CM | POA: Diagnosis not present

## 2018-12-21 DIAGNOSIS — E119 Type 2 diabetes mellitus without complications: Secondary | ICD-10-CM | POA: Diagnosis not present

## 2018-12-21 DIAGNOSIS — R040 Epistaxis: Secondary | ICD-10-CM | POA: Diagnosis present

## 2018-12-21 DIAGNOSIS — U071 COVID-19: Principal | ICD-10-CM

## 2018-12-21 DIAGNOSIS — I2699 Other pulmonary embolism without acute cor pulmonale: Secondary | ICD-10-CM

## 2018-12-21 DIAGNOSIS — Z86711 Personal history of pulmonary embolism: Secondary | ICD-10-CM

## 2018-12-21 DIAGNOSIS — Z888 Allergy status to other drugs, medicaments and biological substances status: Secondary | ICD-10-CM

## 2018-12-21 DIAGNOSIS — K5289 Other specified noninfective gastroenteritis and colitis: Secondary | ICD-10-CM | POA: Diagnosis present

## 2018-12-21 DIAGNOSIS — E559 Vitamin D deficiency, unspecified: Secondary | ICD-10-CM | POA: Diagnosis present

## 2018-12-21 DIAGNOSIS — Z66 Do not resuscitate: Secondary | ICD-10-CM | POA: Diagnosis not present

## 2018-12-21 DIAGNOSIS — T45515A Adverse effect of anticoagulants, initial encounter: Secondary | ICD-10-CM | POA: Diagnosis present

## 2018-12-21 DIAGNOSIS — Z832 Family history of diseases of the blood and blood-forming organs and certain disorders involving the immune mechanism: Secondary | ICD-10-CM

## 2018-12-21 DIAGNOSIS — I129 Hypertensive chronic kidney disease with stage 1 through stage 4 chronic kidney disease, or unspecified chronic kidney disease: Secondary | ICD-10-CM | POA: Diagnosis present

## 2018-12-21 DIAGNOSIS — N183 Chronic kidney disease, stage 3 unspecified: Secondary | ICD-10-CM | POA: Diagnosis present

## 2018-12-21 DIAGNOSIS — F419 Anxiety disorder, unspecified: Secondary | ICD-10-CM | POA: Diagnosis present

## 2018-12-21 DIAGNOSIS — J440 Chronic obstructive pulmonary disease with acute lower respiratory infection: Secondary | ICD-10-CM | POA: Diagnosis present

## 2018-12-21 DIAGNOSIS — M2550 Pain in unspecified joint: Secondary | ICD-10-CM | POA: Diagnosis not present

## 2018-12-21 DIAGNOSIS — K219 Gastro-esophageal reflux disease without esophagitis: Secondary | ICD-10-CM | POA: Diagnosis present

## 2018-12-21 DIAGNOSIS — Z7401 Bed confinement status: Secondary | ICD-10-CM | POA: Diagnosis not present

## 2018-12-21 DIAGNOSIS — Z7901 Long term (current) use of anticoagulants: Secondary | ICD-10-CM

## 2018-12-21 DIAGNOSIS — Z7984 Long term (current) use of oral hypoglycemic drugs: Secondary | ICD-10-CM

## 2018-12-21 DIAGNOSIS — G9349 Other encephalopathy: Secondary | ICD-10-CM | POA: Diagnosis not present

## 2018-12-21 DIAGNOSIS — D638 Anemia in other chronic diseases classified elsewhere: Secondary | ICD-10-CM | POA: Diagnosis present

## 2018-12-21 DIAGNOSIS — F329 Major depressive disorder, single episode, unspecified: Secondary | ICD-10-CM | POA: Diagnosis present

## 2018-12-21 DIAGNOSIS — R4182 Altered mental status, unspecified: Secondary | ICD-10-CM | POA: Diagnosis not present

## 2018-12-21 DIAGNOSIS — R5381 Other malaise: Secondary | ICD-10-CM | POA: Diagnosis not present

## 2018-12-21 DIAGNOSIS — R4020 Unspecified coma: Secondary | ICD-10-CM | POA: Diagnosis not present

## 2018-12-21 DIAGNOSIS — Z79899 Other long term (current) drug therapy: Secondary | ICD-10-CM

## 2018-12-21 DIAGNOSIS — E669 Obesity, unspecified: Secondary | ICD-10-CM | POA: Diagnosis present

## 2018-12-21 DIAGNOSIS — Z87440 Personal history of urinary (tract) infections: Secondary | ICD-10-CM

## 2018-12-21 DIAGNOSIS — E1151 Type 2 diabetes mellitus with diabetic peripheral angiopathy without gangrene: Secondary | ICD-10-CM | POA: Diagnosis present

## 2018-12-21 DIAGNOSIS — Z87891 Personal history of nicotine dependence: Secondary | ICD-10-CM

## 2018-12-21 HISTORY — DX: COVID-19: U07.1

## 2018-12-21 LAB — CBC WITH DIFFERENTIAL/PLATELET
Abs Immature Granulocytes: 0.02 10*3/uL (ref 0.00–0.07)
Basophils Absolute: 0 10*3/uL (ref 0.0–0.1)
Basophils Relative: 0 %
Eosinophils Absolute: 0 10*3/uL (ref 0.0–0.5)
Eosinophils Relative: 0 %
HCT: 25.4 % — ABNORMAL LOW (ref 36.0–46.0)
Hemoglobin: 8 g/dL — ABNORMAL LOW (ref 12.0–15.0)
Immature Granulocytes: 1 %
Lymphocytes Relative: 32 %
Lymphs Abs: 0.5 10*3/uL — ABNORMAL LOW (ref 0.7–4.0)
MCH: 26.6 pg (ref 26.0–34.0)
MCHC: 31.5 g/dL (ref 30.0–36.0)
MCV: 84.4 fL (ref 80.0–100.0)
Monocytes Absolute: 0.1 10*3/uL (ref 0.1–1.0)
Monocytes Relative: 8 %
Neutro Abs: 0.9 10*3/uL — ABNORMAL LOW (ref 1.7–7.7)
Neutrophils Relative %: 59 %
Platelets: 284 10*3/uL (ref 150–400)
RBC: 3.01 MIL/uL — ABNORMAL LOW (ref 3.87–5.11)
RDW: 19.4 % — ABNORMAL HIGH (ref 11.5–15.5)
Smear Review: NORMAL
WBC: 1.5 10*3/uL — ABNORMAL LOW (ref 4.0–10.5)
nRBC: 0 % (ref 0.0–0.2)

## 2018-12-21 LAB — C DIFFICILE QUICK SCREEN W PCR REFLEX
C Diff antigen: POSITIVE — AB
C Diff toxin: NEGATIVE

## 2018-12-21 LAB — COMPREHENSIVE METABOLIC PANEL
ALT: 31 U/L (ref 0–44)
AST: 70 U/L — ABNORMAL HIGH (ref 15–41)
Albumin: 2 g/dL — ABNORMAL LOW (ref 3.5–5.0)
Alkaline Phosphatase: 242 U/L — ABNORMAL HIGH (ref 38–126)
Anion gap: 9 (ref 5–15)
BUN: 17 mg/dL (ref 8–23)
CO2: 22 mmol/L (ref 22–32)
Calcium: 8.5 mg/dL — ABNORMAL LOW (ref 8.9–10.3)
Chloride: 103 mmol/L (ref 98–111)
Creatinine, Ser: 1.05 mg/dL — ABNORMAL HIGH (ref 0.44–1.00)
GFR calc Af Amer: 59 mL/min — ABNORMAL LOW (ref >60)
GFR calc non Af Amer: 51 mL/min — ABNORMAL LOW (ref >60)
Glucose, Bld: 131 mg/dL — ABNORMAL HIGH (ref 70–99)
Potassium: 4.3 mmol/L (ref 3.5–5.1)
Sodium: 134 mmol/L — ABNORMAL LOW (ref 135–145)
Total Bilirubin: 0.7 mg/dL (ref 0.3–1.2)
Total Protein: 6.8 g/dL (ref 6.5–8.1)

## 2018-12-21 LAB — GLUCOSE, CAPILLARY
Glucose-Capillary: 128 mg/dL — ABNORMAL HIGH (ref 70–99)
Glucose-Capillary: 119 mg/dL — ABNORMAL HIGH (ref 70–99)
Glucose-Capillary: 114 mg/dL — ABNORMAL HIGH (ref 70–99)
Glucose-Capillary: 138 mg/dL — ABNORMAL HIGH (ref 70–99)
Glucose-Capillary: 170 mg/dL — ABNORMAL HIGH (ref 70–99)

## 2018-12-21 LAB — APTT
aPTT: >160 seconds (ref 24–36)
aPTT: 98 seconds — ABNORMAL HIGH (ref 24–36)

## 2018-12-21 LAB — HIV ANTIBODY (ROUTINE TESTING W REFLEX): HIV Screen 4th Generation wRfx: NONREACTIVE

## 2018-12-21 LAB — C-REACTIVE PROTEIN: CRP: 8.9 mg/dL — ABNORMAL HIGH (ref <1.0)

## 2018-12-21 LAB — TYPE AND SCREEN
ABO/RH(D): O POS
Antibody Screen: NEGATIVE

## 2018-12-21 LAB — FIBRIN DERIVATIVES D-DIMER (ARMC ONLY): Fibrin derivatives D-dimer (ARMC): 1534.37 ng/mL (FEU) — ABNORMAL HIGH (ref 0.00–499.00)

## 2018-12-21 LAB — MRSA PCR SCREENING: MRSA by PCR: NEGATIVE

## 2018-12-21 LAB — HEPARIN LEVEL (UNFRACTIONATED)
Heparin Unfractionated: 2.18 IU/mL — ABNORMAL HIGH (ref 0.30–0.70)
Heparin Unfractionated: 1.32 IU/mL — ABNORMAL HIGH (ref 0.30–0.70)

## 2018-12-21 LAB — MAGNESIUM: Magnesium: 1.8 mg/dL (ref 1.7–2.4)

## 2018-12-21 LAB — FERRITIN: Ferritin: 94 ng/mL (ref 11–307)

## 2018-12-21 MED ORDER — LEVETIRACETAM ER 500 MG PO TB24
500.0000 mg | ORAL_TABLET | Freq: Every day | ORAL | Status: DC
  Administered 2018-12-21 – 2018-12-23 (×3): 500 mg via ORAL
  Filled 2018-12-21 (×4): qty 1

## 2018-12-21 MED ORDER — VITAMIN B-12 1000 MCG PO TABS
1000.0000 ug | ORAL_TABLET | Freq: Every day | ORAL | Status: DC
  Administered 2018-12-21 – 2018-12-31 (×8): 1000 ug via ORAL
  Filled 2018-12-21 (×11): qty 1

## 2018-12-21 MED ORDER — BISACODYL 5 MG PO TBEC: 5.0000 mg | DELAYED_RELEASE_TABLET | Freq: Every day | ORAL | Status: DC | PRN

## 2018-12-21 MED ORDER — ONDANSETRON HCL 4 MG/2ML IJ SOLN: 4.0000 mg | Freq: Four times a day (QID) | INTRAMUSCULAR | Status: DC | PRN

## 2018-12-21 MED ORDER — FAMOTIDINE 20 MG PO TABS
40.0000 mg | ORAL_TABLET | Freq: Every day | ORAL | Status: DC
  Administered 2018-12-21 – 2018-12-31 (×9): 40 mg via ORAL
  Filled 2018-12-21 (×9): qty 2

## 2018-12-21 MED ORDER — ATORVASTATIN CALCIUM 40 MG PO TABS
80.0000 mg | ORAL_TABLET | Freq: Every evening | ORAL | Status: DC
  Administered 2018-12-21 – 2018-12-30 (×7): 80 mg via ORAL
  Filled 2018-12-21 (×7): qty 2

## 2018-12-21 MED ORDER — ADULT MULTIVITAMIN W/MINERALS CH
1.0000 | ORAL_TABLET | Freq: Every day | ORAL | Status: DC
  Administered 2018-12-21 – 2018-12-31 (×9): 1 via ORAL
  Filled 2018-12-21 (×9): qty 1

## 2018-12-21 MED ORDER — SODIUM CHLORIDE 0.9 % IV SOLN
100.0000 mg | INTRAVENOUS | Status: AC
  Administered 2018-12-21 – 2018-12-24 (×4): 100 mg via INTRAVENOUS
  Filled 2018-12-21 (×4): qty 20

## 2018-12-21 MED ORDER — ACETAMINOPHEN 325 MG PO TABS
650.0000 mg | ORAL_TABLET | Freq: Four times a day (QID) | ORAL | Status: DC | PRN
  Administered 2018-12-21 – 2018-12-27 (×4): 650 mg via ORAL
  Filled 2018-12-21 (×4): qty 2

## 2018-12-21 MED ORDER — HYDRALAZINE HCL 50 MG PO TABS
25.0000 mg | ORAL_TABLET | Freq: Three times a day (TID) | ORAL | Status: DC
  Administered 2018-12-21 – 2018-12-31 (×21): 25 mg via ORAL
  Filled 2018-12-21 (×21): qty 1

## 2018-12-21 MED ORDER — VITAMIN D 25 MCG (1000 UNIT) PO TABS
1000.0000 [IU] | ORAL_TABLET | Freq: Every day | ORAL | Status: DC
  Administered 2018-12-21 – 2018-12-31 (×9): 1000 [IU] via ORAL
  Filled 2018-12-21 (×11): qty 1

## 2018-12-21 MED ORDER — NITROGLYCERIN 0.4 MG SL SUBL: 0.4000 mg | SUBLINGUAL_TABLET | SUBLINGUAL | Status: DC | PRN

## 2018-12-21 MED ORDER — INSULIN ASPART 100 UNIT/ML ~~LOC~~ SOLN: 0.0000 [IU] | Freq: Every day | SUBCUTANEOUS | Status: DC

## 2018-12-21 MED ORDER — LINAGLIPTIN 5 MG PO TABS
5.0000 mg | ORAL_TABLET | Freq: Every day | ORAL | Status: DC
  Administered 2018-12-21: 10:00:00 5 mg via ORAL
  Filled 2018-12-21: qty 1

## 2018-12-21 MED ORDER — HEPARIN (PORCINE) 25000 UT/250ML-% IV SOLN
1000.0000 [IU]/h | INTRAVENOUS | Status: DC
  Administered 2018-12-21: 10:00:00 1000 [IU]/h via INTRAVENOUS

## 2018-12-21 MED ORDER — DOCUSATE SODIUM 100 MG PO CAPS
100.0000 mg | ORAL_CAPSULE | Freq: Two times a day (BID) | ORAL | Status: DC
  Administered 2018-12-22 – 2018-12-31 (×9): 100 mg via ORAL
  Filled 2018-12-21 (×14): qty 1

## 2018-12-21 MED ORDER — IPRATROPIUM-ALBUTEROL 20-100 MCG/ACT IN AERS
1.0000 | INHALATION_SPRAY | Freq: Four times a day (QID) | RESPIRATORY_TRACT | Status: DC | PRN
  Filled 2018-12-21: qty 4

## 2018-12-21 MED ORDER — PANTOPRAZOLE SODIUM 40 MG PO TBEC: 40.0000 mg | DELAYED_RELEASE_TABLET | Freq: Every day | ORAL | Status: DC

## 2018-12-21 MED ORDER — ONDANSETRON HCL 4 MG PO TABS: 4.0000 mg | ORAL_TABLET | Freq: Four times a day (QID) | ORAL | Status: DC | PRN

## 2018-12-21 MED ORDER — HEPARIN (PORCINE) 25000 UT/250ML-% IV SOLN
1000.0000 [IU]/h | INTRAVENOUS | Status: DC
  Administered 2018-12-21: 1000 [IU]/h via INTRAVENOUS
  Filled 2018-12-21: qty 250

## 2018-12-21 MED ORDER — LINAGLIPTIN 5 MG PO TABS
5.0000 mg | ORAL_TABLET | Freq: Every day | ORAL | Status: DC
  Administered 2018-12-21 – 2018-12-31 (×9): 5 mg via ORAL
  Filled 2018-12-21 (×13): qty 1

## 2018-12-21 MED ORDER — INSULIN ASPART 100 UNIT/ML ~~LOC~~ SOLN
0.0000 [IU] | Freq: Three times a day (TID) | SUBCUTANEOUS | Status: DC
  Administered 2018-12-21 – 2018-12-22 (×2): 1 [IU] via SUBCUTANEOUS
  Administered 2018-12-28: 2 [IU] via SUBCUTANEOUS

## 2018-12-21 MED ORDER — DEXAMETHASONE SODIUM PHOSPHATE 10 MG/ML IJ SOLN: 6.0000 mg | INTRAMUSCULAR | 0 refills | Status: DC

## 2018-12-21 MED ORDER — DEXAMETHASONE SODIUM PHOSPHATE 10 MG/ML IJ SOLN
6.0000 mg | INTRAMUSCULAR | Status: DC
  Administered 2018-12-21 – 2018-12-23 (×3): 6 mg via INTRAVENOUS
  Filled 2018-12-21 (×3): qty 1

## 2018-12-21 MED ORDER — GABAPENTIN 300 MG PO CAPS
300.0000 mg | ORAL_CAPSULE | Freq: Every day | ORAL | Status: DC
  Administered 2018-12-21 – 2018-12-30 (×9): 300 mg via ORAL
  Filled 2018-12-21 (×10): qty 1

## 2018-12-21 NOTE — Progress Notes (Signed)
Called and left message for patient's husband, Bennie Hind.  Left my number if he had any questions or concerns.  Earleen Reaper RN

## 2018-12-21 NOTE — ED Notes (Addendum)
Pt given clear liquid breakfast tray at this time. Pt repositioned in bed at this time. Medications administered per MD order. Will continue to monitor for further patient needs.

## 2018-12-21 NOTE — H&P (Addendum)
TRH H&P   Patient Demographics:    Katherine [name], is a 78 y.o. female  MRN: [id number]   DOB - 09/21/1940  Admit Date - 12/21/2018  Outpatient Primary MD for the patient is Valerie Roys, DO    Patient coming from: [facility]  CC - nose bleed   HPI:    Katherine [name]  is a 78 y.o. female, with history of recent PE diagnosed November 21, 2018 currently on Eliquis, generalized weakness and deconditioning, virtually bedbound and nursing home resident for several months, CKD 3, DM type II, hypertension, COPD, GERD, seizures, obesity, CVA who lives in a nursing home and initially presented to [facility] ER with chief complaints of nosebleeding.  She was found to have epistaxis and it was treated.  Subsequently testing suggested that she had COVID-19 infection and sent to [facility] for further care.  Patient is currently relatively symptom-free.  Denies any headache, no chest pain shortness of breath.  No productive cough.  No focal weakness which is new.    Review of systems:     A full 10 point Review of Systems was done, except as stated above, all other Review of Systems were negative.   With Past History of the following :    Past Medical History:  Diagnosis Date   Angioedema    Anxiety and depression    CHF (congestive heart failure) (HCC)    Chronic constipation    Chronic kidney disease    stage 3   COPD (chronic obstructive pulmonary disease) (HCC)    Diabetes mellitus without complication (HCC)    Gallstones    GERD (gastroesophageal reflux disease)    Hypertension    Left ventricular hypertrophy    Osteoarthritis    Prurigo nodularis    Seizures ([place])    Stroke ([place])    Tobacco  abuse    Vitamin D deficiency disease       Past Surgical History:  Procedure Laterality Date   CHOLECYSTECTOMY     POLYPECTOMY  11/2011   vocal cord   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   Procedure: TOTAL KNEE ARTHROPLASTY;  Surgeon: Hessie Knows, MD;  Location: [facility] ORS;  Service: Orthopedics;  Laterality: Right;      Social History:     Social History   Tobacco Use   Smoking status:  Former Smoker    Packs/day: 0.50    Years: 60.00    Pack years: 30.00    Types: Cigarettes   Smokeless tobacco: Never Used   Tobacco comment: quite 19mo ago-03/26/18  Substance Use Topics   Alcohol use: No    Alcohol/week: 0.0 standard drinks    Comment: rare         Family History :     Family History  Problem Relation Age of Onset   Alcohol abuse Mother    Sickle cell anemia Daughter    Hypertension Son    Cancer Neg Hx    COPD Neg Hx    Diabetes Neg Hx    Heart disease Neg Hx    Stroke Neg Hx        Home Medications:   Prior to Admission medications   Medication Sig Start Date End Date Taking? Authorizing Provider  acetaminophen (TYLENOL) 650 MG CR tablet Take 650 mg by mouth 3 (three) times daily.     [provider]  albuterol (ACCUNEB) 0.63 MG/3ML nebulizer solution Take 3 mLs by nebulization every 8 (eight) hours as needed for wheezing.     [provider]  apixaban (ELIQUIS) 5 MG TABS tablet Take 2 tablets (10 mg total) by mouth 2 (two) times daily for 5 doses. Patient not taking: Reported on 12/19/2018 11/26/18 12/19/18  Otila Back, MD  apixaban (ELIQUIS) 5 MG TABS tablet Take 1 tablet (5 mg total) by mouth 2 (two) times daily. To initiate this on 11/29/2018 after completing the 10 mg twice daily treatment regimen 11/29/18   Otila Back, MD  atorvastatin (LIPITOR) 80 MG tablet Take 80 mg by mouth every evening.     [provider]  cetirizine (ZYRTEC) 10 MG tablet Take 5 mg by mouth at bedtime.    [provider]    cholecalciferol (VITAMIN D) 1000 units tablet Take 1,000 Units by mouth daily.    [provider]  dexamethasone (DECADRON) 10 MG/ML injection Inject 0.6 mLs (6 mg total) into the vein daily. 12/21/18   Danford, Suann Larry, MD  diclofenac sodium (VOLTAREN) 1 % GEL Apply 2 g topically every 12 (twelve) hours as needed (for left knee pain).     [provider]  docusate sodium (COLACE) 100 MG capsule Take 100 mg by mouth 2 (two) times daily.    [provider]  FLUoxetine (PROZAC) 40 MG capsule Take 40 mg by mouth daily.     [provider]  Fluticasone-Umeclidin-Vilant 100-62.5-25 MCG/INH AEPB Inhale 1 puff into the lungs daily.    [provider]  gabapentin (NEURONTIN) 300 MG capsule Take 300 mg by mouth at bedtime.    [provider]  hydrALAZINE (APRESOLINE) 25 MG tablet Take 25 mg by mouth 3 (three) times daily. 06/19/18   [provider]  ipratropium (ATROVENT) 0.03 % nasal spray Place 2 sprays into both nostrils 2 (two) times daily.     [provider]  ipratropium-albuterol (DUONEB) 0.5-2.5 (3) MG/3ML SOLN Take 3 mLs by nebulization 3 (three) times daily. 07/17/18   Gladstone Lighter, MD  levETIRAcetam (KEPPRA XR) 500 MG 24 hr tablet Take 1 tablet (500 mg total) by mouth daily. 11/19/17   Frann Rider, NP  linagliptin (TRADJENTA) 5 MG TABS tablet Take 5 mg by mouth daily.    [provider]  meclizine (ANTIVERT) 25 MG tablet Take 25 mg by mouth every 12 (twelve) hours as needed for dizziness.     [provider]  Multiple Vitamin (MULTIVITAMIN WITH MINERALS) TABS tablet Take 1 tablet by mouth daily.    [provider]  mupirocin ointment (BACTROBAN) 2 % Place 1 application into the nose 2 (two) times daily. 06/13/18   Loletha Grayer, MD  nitrofurantoin, macrocrystal-monohydrate, (MACROBID) 100 MG capsule Take 100 mg by mouth 2 (two) times daily. 12/19/18 12/25/18  [provider]   nitroGLYCERIN (NITROSTAT) 0.4 MG SL tablet Place 0.4 mg under the tongue every 5 (five) minutes as needed for chest pain.  08/07/18   [provider]  oxymetazoline (AFRIN) 0.05 % nasal spray Place 1 spray into both nostrils 2 (two) times daily. 12/18/18 12/22/18  [provider]  pantoprazole (PROTONIX) 40 MG tablet Take 1 tablet (40 mg total) by mouth daily. 04/29/18   Bettey Costa, MD  vitamin B-12 1000 MCG tablet Take 1 tablet (1,000 mcg total) by mouth daily. 04/30/18   Bettey Costa, MD     Allergies:     Allergies  Allergen Reactions   Bee Venom Swelling   Enalapril Maleate Swelling     Physical Exam:   Vitals  Temperature 97.7 pulse 85, respiration rate 19, blood pressure 151/90.  98% on 1.5 L nasal cannula oxygen.   1. General elderly African-American female lying in hospital bed wearing nasal cannula oxygen in no distress,  2. Normal affect and insight, Not Suicidal or Homicidal, Awake Alert, Oriented X 3.  3. No F.N deficits, ALL C.Nerves Intact, Strength 5/5 all 4 extremities, Sensation intact all 4 extremities, Plantars down going.  4. Ears and Eyes appear Normal, Conjunctivae clear, PERRLA. Moist Oral Mucosa.  5. Supple Neck, No JVD, No cervical lymphadenopathy appriciated, No Carotid Bruits.  6. Symmetrical Chest wall movement, Good air movement bilaterally, CTAB.  7. RRR, No Gallops, Rubs or Murmurs, No Parasternal Heave.  8. Positive Bowel Sounds, Abdomen Soft, No tenderness, No organomegaly appriciated,No rebound -guarding or rigidity.  9.  No Cyanosis, Normal Skin Turgor, No Skin Rash or Bruise.  10. Good muscle tone,  joints appear normal , no effusions, Normal ROM.  11. No Palpable Lymph Nodes in Neck or Axillae      Data Review:    CBC Recent Labs  Lab 12/19/18 2125 12/20/18 0947 12/21/18 0721  WBC 2.9* 2.9* 1.5*  HGB 8.0* 7.9* 8.0*  HCT 25.4* 25.4* 25.4*  PLT 235 265 284  MCV 83.3 84.1 84.4  MCH 26.2 26.2 26.6  MCHC 31.5  31.1 31.5  RDW 19.3* 19.3* 19.4*  LYMPHSABS 0.9  --  0.5*  MONOABS 0.3  --  0.1  EOSABS 0.1  --  0.0  BASOSABS 0.0  --  0.0   ------------------------------------------------------------------------------------------------------------------  Chemistries  Recent Labs  Lab 12/19/18 2125 12/21/18 0721  NA 136 134*  K 4.1 4.3  CL 105 103  CO2 24 22  GLUCOSE 97 131*  BUN 19 17  CREATININE 1.45* 1.05*  CALCIUM 8.6* 8.5*  MG  --  1.8  AST 89* 70*  ALT 33 31  ALKPHOS 257* 242*  BILITOT 0.8 0.7   ------------------------------------------------------------------------------------------------------------------ estimated creatinine clearance is 51.3 mL/min (A) (by C-G formula based on SCr of 1.05 mg/dL (H)). ------------------------------------------------------------------------------------------------------------------ No results for input(s): TSH, T4TOTAL, T3FREE, THYROIDAB in the last 72 hours.  Invalid input(s): FREET3  Coagulation profile Recent Labs  Lab 12/19/18 2125 12/20/18 0947  INR 1.4* 1.3*   ------------------------------------------------------------------------------------------------------------------- No results for input(s): DDIMER in the last 72 hours. -------------------------------------------------------------------------------------------------------------------  Cardiac Enzymes No results for input(s): CKMB,  TROPONINI, MYOGLOBIN in the last 168 hours.  Invalid input(s): CK ------------------------------------------------------------------------------------------------------------------    Component Value Date/Time   BNP 63.0 12/19/2018 2125     ---------------------------------------------------------------------------------------------------------------  Urinalysis    Component Value Date/Time   COLORURINE YELLOW (A) 09/06/2018 1222   APPEARANCEUR CLOUDY (A) 09/06/2018 1222   APPEARANCEUR Clear 02/20/2013 1459   LABSPEC 1.014 09/06/2018  1222   LABSPEC 1.012 02/20/2013 1459   PHURINE 5.0 09/06/2018 1222   GLUCOSEU NEGATIVE 09/06/2018 1222   GLUCOSEU Negative 02/20/2013 1459   HGBUR NEGATIVE 09/06/2018 1222   BILIRUBINUR NEGATIVE 09/06/2018 1222   BILIRUBINUR Negative 02/20/2013 1459   KETONESUR NEGATIVE 09/06/2018 1222   PROTEINUR NEGATIVE 09/06/2018 1222   NITRITE NEGATIVE 09/06/2018 1222   LEUKOCYTESUR LARGE (A) 09/06/2018 1222   LEUKOCYTESUR Negative 02/20/2013 1459    ----------------------------------------------------------------------------------------------------------------   Imaging Results:    Dg Chest 2 View  Result Date: 12/19/2018 CLINICAL DATA:  Hemoptysis, cough EXAM: CHEST - 2 VIEW COMPARISON:  11/24/2018 FINDINGS: Increased markings in the lower lungs, likely scarring, similar to prior study. Heart is borderline in size. Aortic atherosclerosis. No acute airspace opacities or effusions. No acute bony abnormality. IMPRESSION: Bibasilar linear densities, likely scarring. No definite acute process. Electronically Signed   By: Rolm Baptise M.D.   On: 12/19/2018 20:59   Ct Head Wo Contrast  Result Date: 12/19/2018 CLINICAL DATA:  Headache, on Eliquis EXAM: CT HEAD WITHOUT CONTRAST TECHNIQUE: Contiguous axial images were obtained from the base of the skull through the vertex without intravenous contrast. COMPARISON:  09/06/2018 FINDINGS: Brain: Patchy chronic small vessel disease throughout the deep white matter. No acute intracranial abnormality. Specifically, no hemorrhage, hydrocephalus, mass lesion, acute infarction, or significant intracranial injury. Vascular: No hyperdense vessel or unexpected calcification. Skull: No acute calvarial abnormality. Sinuses/Orbits: Visualized paranasal sinuses and mastoids clear. Orbital soft tissues unremarkable. 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 No acute intracranial abnormality. Electronically Signed   By: Rolm Baptise M.D.   On: 12/19/2018 21:00   Ct Angio Chest Pe W And/or Wo Contrast  Result Date: 12/20/2018 CLINICAL DATA:  Nose bleed.  Hemoptysis. EXAM: CT ANGIOGRAPHY CHEST WITH CONTRAST TECHNIQUE: Multidetector CT imaging of the chest was performed using the standard protocol during bolus administration of intravenous contrast. Multiplanar CT image reconstructions and MIPs were obtained to evaluate the vascular anatomy. CONTRAST:  38mL OMNIPAQUE IOHEXOL 350 MG/ML SOLN COMPARISON:  11/21/2018 FINDINGS: Cardiovascular: Contrast injection is sufficient to demonstrate satisfactory opacification of the pulmonary arteries to the segmental level.Again identified are small subsegmental pulmonary emboli involving the left lower lobe, slightly improved from prior study. The main pulmonary artery is dilated measuring approximately 3.3 cm. Aortic calcifications are noted. The heart size is stable. Coronary artery calcifications are noted. Mediastinum/Nodes: --No mediastinal or hilar lymphadenopathy. --No axillary lymphadenopathy. --No supraclavicular lymphadenopathy. --Normal thyroid gland. --The esophagus is unremarkable Lungs/Pleura: There are worsening bibasilar airspace opacities. There is no pneumothorax. There are trace bilateral pleural effusions, right greater than left. The trachea is unremarkable. Upper Abdomen: No acute abnormality. Musculoskeletal: No chest wall abnormality. No acute or significant osseous findings. Review of the MIP images confirms the above findings. IMPRESSION: 1. Improving subsegmental pulmonary emboli involving the left lower lobe. 2. Worsening bibasilar airspace opacities which may represent atelectasis or infiltrates. 3. Trace bilateral pleural effusions. 4. Dilated main pulmonary artery which can be seen in patients with elevated pulmonary artery pressures. Aortic Atherosclerosis (ICD10-I70.0). Electronically Signed   By: Jamie Kato.D.  On: 12/20/2018 01:09    My personal  review of EKG: Rhythm NSR, 85 bpm no acute changes.   Assessment & Plan:     1.  Acute COVID-19 pneumonitis.  Diagnosed incidentally, CT chest suggestive of bilateral infiltrates and pneumonia, currently in no distress and using 1 to 2 L nasal cannula oxygen.  She has been started on Decadron and remdesivir at [facility] which we will continue.  Encouraged her to sit up in chair to use flutter valve and I-S in daytime and prone at night when in bed.  Will monitor clinically.  COVID-19 Labs  Recent Labs    12/20/18 1125 12/20/18 1925 12/21/18 0721  FERRITIN  --  63 94  LDH 148  --   --     Lab Results  Component Value Date   SARSCOV2NAA POSITIVE (A) 12/19/2018   SARSCOV2NAA NOT DETECTED 11/24/2018   [place] NEGATIVE 11/21/2018   [place] NEGATIVE 09/06/2018    2.  Recent PE diagnosed October 2020.  Due to recent nosebleed Eliquis held will continue heparin drip for now.  Nosebleed seems to have resolved.  3.  History of ESBL UTI.  She was started on Invanz on 12/19/2018.  Currently no signs of active infection, will rule out C. difficile as an #8 below thereafter if signs of any active infection will add antibiotics appropriately.  4.  Leukopenia.  Likely due to viral illness.  5.  History of CVA.  Continue statin for secondary prevention along with anticoagulation.  6.  Weakness, deconditioning, bedbound status.  Supportive care discharge back to SNF once better.  7.  GERD.  Switch PPI to Pepcid, rule out C. difficile first.  8.  Gastroenteritis.  Likely Covid related but will rule out C. difficile before trying Imodium as she has some exposure to recent antibiotics due to ESBL UTI.  9.  CKD 2.  Baseline creatinine 1.2 will monitor.   10. DM type II.  Continue Tradjenta, check A1c placed sliding scale.    DVT Prophylaxis Heparin GTT  AM Labs Ordered, also please review Full Orders  Family Communication: Admission, patients condition and plan of care  including tests being ordered have been discussed with the patient  who indicates understanding and agree with the plan and Code Status.  Code Status Full  Likely DC to  SNF  Condition GUARDED    Consults called: None    Admission status: Inpt    Time spent in minutes : 35   Lala Lund M.D on 12/21/2018 at 3:13 PM  To page go to [email or web address] - password [facility]

## 2018-12-21 NOTE — Progress Notes (Signed)
ANTICOAGULATION CONSULT NOTE - Follow Up Consult  Pharmacy Consult for Heparin IV Indication: pulmonary embolus  Allergies  Allergen Reactions  . Bee Venom Swelling  . Enalapril Maleate Swelling    Patient Measurements:   Heparin Dosing Weight: 76.6 kg  Vital Signs: Temp: 98.3 F (36.8 C) (11/07 1600) BP: 135/88 (11/07 1600) Pulse Rate: 93 (11/07 1336)  Labs: Recent Labs    12/19/18 2125 12/20/18 0947 12/21/18 0721 12/21/18 1517  HGB 8.0* 7.9* 8.0*  --   HCT 25.4* 25.4* 25.4*  --   PLT 235 265 284  --   APTT  --  39* >160* 98*  LABPROT 17.3* 15.9*  --   --   INR 1.4* 1.3*  --   --   HEPARINUNFRC  --   --  2.18* 1.32*  CREATININE 1.45*  --  1.05*  --     Estimated Creatinine Clearance: 51.3 mL/min (A) (by C-G formula based on SCr of 1.05 mg/dL (H)).   Medical History: Past Medical History:  Diagnosis Date  . Angioedema   . Anxiety and depression   . CHF (congestive heart failure) ([place])   . Chronic constipation   . Chronic kidney disease    stage 3  . COPD (chronic obstructive pulmonary disease) ([place])   . Diabetes mellitus without complication ([place])   . Gallstones   . GERD (gastroesophageal reflux disease)   . Hypertension   . Left ventricular hypertrophy   . Osteoarthritis   . Prurigo nodularis   . Seizures ([place])   . Stroke ([place])   . Tobacco abuse   . Vitamin D deficiency disease     Medications:  Scheduled:  . atorvastatin  80 mg Oral QPM  . cholecalciferol  1,000 Units Oral Daily  . dexamethasone (DECADRON) injection  6 mg Intravenous Q24H  . docusate sodium  100 mg Oral BID  . famotidine  40 mg Oral Daily  . gabapentin  300 mg Oral QHS  . hydrALAZINE  25 mg Oral TID  . insulin aspart  0-5 Units Subcutaneous QHS  . insulin aspart  0-9 Units Subcutaneous TID WC  . levETIRAcetam  500 mg Oral Daily  . linagliptin  5 mg Oral Daily  . multivitamin with minerals  1 tablet Oral Daily  . cyanocobalamin  1,000 mcg Oral Daily   Infusions:  .  heparin 1,000 Units/hr (12/21/18 1723)  . remdesivir 100 mg in NS 250 mL 100 mg (12/21/18 1724)    Assessment: 46 yoF admitted on 11/7 with COVID-19 pneumonia, hemoptysis, epistaxis.  PMH is significant for recent PE in October, 2020 on apixaban prior to admission.  Pharmacy is consulted to dose IV Heparin inpatient. Last dose of apixaban 5mg  PO BID taken on 11/4 AM per facility [facility].  She was started on heparin IV at [facility], initially started at 1200 units/hr with aPTT > 160, and HL 2.18.  Heparin was reduced to 1000 units/hr and continued on transfer to [place].  Ordered APTT, and Heparin level (may be falsely elevated d/t apixaban, but is now > 72 hours after last dose.) CBC: Hgb remains low/stable at 8, Plt wnl. RN at [facility] reports no bleeding or complications on arrival from [facility] to [place] unit.  PM: aPTT = 98, heparin level = 1.32.  No bleeding complications reported per discussion with RN.   Goal of Therapy:  APTT 66-102 seconds Heparin level 0.3-0.7 units/ml Monitor platelets by anticoagulation protocol: Yes   Plan:  Continue heparin IV infusion at 1000 units/hr  6 hour Heparin level, APTT to confirm therapeutic. Once aPTT and heparin levels correlate, will titrate heparin using heparin levels only Daily heparin level, aptt, and CBC Continue to monitor H&H and platelets  Peggyann Juba, PharmD, BCPS Pharmacy: [phone] 12/21/2018 6:56 PM

## 2018-12-21 NOTE — ED Notes (Signed)
Pt repositioned in bed by this RN and Yetta Flock, RN. Pt tolerated well. Clean sheets, clean chuck pads, and clean brief applied to patient at this time due to patient having episode of urine and stool incontinence. Purewick applied at this time. Pt requesting this RN and Alyssa, RN to call her husband at this time. This RN explained with permission from Dr. Loleta Books that patient would be admitted to [facility] due to Covid+ status, pt states understanding at this time.

## 2018-12-21 NOTE — Progress Notes (Signed)
Physician Discharge Summary  Katherine [name] [id number] DOB: 04/04/1940 DOA: 12/19/2018  PCP: Valerie Roys, DO  Admit date: 12/19/2018 Discharge date: 12/21/2018  Admitted From: Home  Disposition:  [place]   Discharge Condition: Stable  CODE STATUS: FULL Diet recommendation: Diabetic, cardiac        Brief/Interim Summary: [name] is a 78 y.o. F with COPD not on O2, MO, dCHF, DM, HTN, seizures, recent PE on Eliquis, hx CVA, and CKD IIIb Cr 1.4 at baseline who presented with cough, hemoptysis and severe epistaxis.  In the ER, CTA showed bibasilar pneumonia, improvement in PE.  No evidence of pulmonary hemorrhage.  No significant hemoptysis in ER.  SARS-CoV-2 positive.          PRINCIPAL HOSPITAL DIAGNOSIS: COVID-19    Discharge Diagnoses:    Coronavirus pneumonitis with acute hypoxic respiratory failure In the setting of the ongoing 2020 COVID-19 pandemic.  Bilateral infiltrates on CT and requiring supplemental O2 2L.   Got one dose Remdesivir and Decadron at [facility].   Epistaxis This began yesterday.  All hemoptysis was thought to be from swallowed blood.  Patient was switched to heparin drip overnight and had no more incidence of epistaxis or hemoptysis.  Acute metabolic encephalopathy From COVID  Recent PE Continued on systemic anticoagulation.  Anemia of chronic disease of CKD Hgb stable relative to baseline  CKD IIIb Cr stable relative to baseline  Chronic diastolic CHF Hypertension History CVA No evidence of fluid overload.  Diabetes  COPD No wheezing  History seizures -Continue Keppra, gabapentin  Depression  ESBL UTI This had recently been diagnosed at SNF, and patient was on nitrofurantoin and Invanz there.  Was continued on Merrem here. Urine culture was ordered here but patient produced no urine.  She is unable to endorse symptoms of UTI to me.  Leukopenia From COVID             Discharge Instructions   Allergies  as of 12/21/2018      Reactions   Bee Venom Swelling   Enalapril Maleate Swelling      Medication List    STOP taking these medications   ertapenem 1 g injection Commonly known as: INVANZ   LORazepam 2 MG/ML injection Commonly known as: ATIVAN     TAKE these medications   acetaminophen 650 MG CR tablet Commonly known as: TYLENOL Take 650 mg by mouth 3 (three) times daily.   albuterol 0.63 MG/3ML nebulizer solution Commonly known as: ACCUNEB Take 3 mLs by nebulization every 8 (eight) hours as needed for wheezing.   apixaban 5 MG Tabs tablet Commonly known as: ELIQUIS Take 2 tablets (10 mg total) by mouth 2 (two) times daily for 5 doses.   apixaban 5 MG Tabs tablet Commonly known as: ELIQUIS Take 1 tablet (5 mg total) by mouth 2 (two) times daily. To initiate this on 11/29/2018 after completing the 10 mg twice daily treatment regimen   atorvastatin 80 MG tablet Commonly known as: LIPITOR Take 80 mg by mouth every evening.   cetirizine 10 MG tablet Commonly known as: ZYRTEC Take 5 mg by mouth at bedtime.   cholecalciferol 1000 units tablet Commonly known as: VITAMIN D Take 1,000 Units by mouth daily.   cyanocobalamin 1000 MCG tablet Take 1 tablet (1,000 mcg total) by mouth daily.   dexamethasone 10 MG/ML injection Commonly known as: DECADRON Inject 0.6 mLs (6 mg total) into the vein daily.   diclofenac sodium 1 % Gel Commonly known as: VOLTAREN Apply 2  g topically every 12 (twelve) hours as needed (for left knee pain).   docusate sodium 100 MG capsule Commonly known as: COLACE Take 100 mg by mouth 2 (two) times daily.   FLUoxetine 40 MG capsule Commonly known as: PROZAC Take 40 mg by mouth daily.   Fluticasone-Umeclidin-Vilant 100-62.5-25 MCG/INH Aepb Inhale 1 puff into the lungs daily.   gabapentin 300 MG capsule Commonly known as: NEURONTIN Take 300 mg by mouth at bedtime.   hydrALAZINE 25 MG tablet Commonly known as: APRESOLINE Take 25 mg by  mouth 3 (three) times daily.   ipratropium 0.03 % nasal spray Commonly known as: ATROVENT Place 2 sprays into both nostrils 2 (two) times daily.   ipratropium-albuterol 0.5-2.5 (3) MG/3ML Soln Commonly known as: DUONEB Take 3 mLs by nebulization 3 (three) times daily.   levETIRAcetam 500 MG 24 hr tablet Commonly known as: KEPPRA XR Take 1 tablet (500 mg total) by mouth daily.   linagliptin 5 MG Tabs tablet Commonly known as: TRADJENTA Take 5 mg by mouth daily.   meclizine 25 MG tablet Commonly known as: ANTIVERT Take 25 mg by mouth every 12 (twelve) hours as needed for dizziness.   multivitamin with minerals Tabs tablet Take 1 tablet by mouth daily.   mupirocin ointment 2 % Commonly known as: BACTROBAN Place 1 application into the nose 2 (two) times daily.   nitrofurantoin (macrocrystal-monohydrate) 100 MG capsule Commonly known as: MACROBID Take 100 mg by mouth 2 (two) times daily.   nitroGLYCERIN 0.4 MG SL tablet Commonly known as: NITROSTAT Place 0.4 mg under the tongue every 5 (five) minutes as needed for chest pain.   oxymetazoline 0.05 % nasal spray Commonly known as: AFRIN Place 1 spray into both nostrils 2 (two) times daily.   pantoprazole 40 MG tablet Commonly known as: PROTONIX Take 1 tablet (40 mg total) by mouth daily.       Allergies  Allergen Reactions  . Bee Venom Swelling  . Enalapril Maleate Swelling    Consultations:     Procedures/Studies: Dg Chest 1 View  Result Date: 11/24/2018 CLINICAL DATA:  Hemoptysis. Ex-smoker. CHF. EXAM: CHEST  1 VIEW COMPARISON:  CT of 11/21/2018. Most recent chest radiograph 11/21/2018. FINDINGS: Midline trachea. Moderate cardiomegaly. Atherosclerosis in the transverse aorta. No pleural effusion or pneumothorax. Apical lordotic positioning. Diffuse peribronchial thickening. No lobar consolidation. Left lung base scarring. IMPRESSION: 1. No acute cardiopulmonary disease. 2. Cardiomegaly with chronic  interstitial thickening. 3.  Aortic Atherosclerosis (ICD10-I70.0). Electronically Signed   By: Abigail Miyamoto M.D.   On: 11/24/2018 20:56   Dg Chest 2 View  Result Date: 12/19/2018 CLINICAL DATA:  Hemoptysis, cough EXAM: CHEST - 2 VIEW COMPARISON:  11/24/2018 FINDINGS: Increased markings in the lower lungs, likely scarring, similar to prior study. Heart is borderline in size. Aortic atherosclerosis. No acute airspace opacities or effusions. No acute bony abnormality. IMPRESSION: Bibasilar linear densities, likely scarring. No definite acute process. Electronically Signed   By: Rolm Baptise M.D.   On: 12/19/2018 20:59   Ct Head Wo Contrast  Result Date: 12/19/2018 CLINICAL DATA:  Headache, on Eliquis EXAM: CT HEAD WITHOUT CONTRAST TECHNIQUE: Contiguous axial images were obtained from the base of the skull through the vertex without intravenous contrast. COMPARISON:  09/06/2018 FINDINGS: Brain: Patchy chronic small vessel disease throughout the deep white matter. No acute intracranial abnormality. Specifically, no hemorrhage, hydrocephalus, mass lesion, acute infarction, or significant intracranial injury. Vascular: No hyperdense vessel or unexpected calcification. Skull: No acute calvarial abnormality. Sinuses/Orbits: Visualized paranasal  sinuses and mastoids clear. Orbital soft tissues unremarkable. 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 No acute intracranial abnormality. Electronically Signed   By: Rolm Baptise M.D.   On: 12/19/2018 21:00   Ct Angio Chest Pe W And/or Wo Contrast  Result Date: 12/20/2018 CLINICAL DATA:  Nose bleed.  Hemoptysis. EXAM: CT ANGIOGRAPHY CHEST WITH CONTRAST TECHNIQUE: Multidetector CT imaging of the chest was performed using the standard protocol during bolus administration of intravenous contrast. Multiplanar CT image reconstructions and MIPs were obtained to evaluate the vascular anatomy. CONTRAST:  25mL OMNIPAQUE IOHEXOL  350 MG/ML SOLN COMPARISON:  11/21/2018 FINDINGS: Cardiovascular: Contrast injection is sufficient to demonstrate satisfactory opacification of the pulmonary arteries to the segmental level.Again identified are small subsegmental pulmonary emboli involving the left lower lobe, slightly improved from prior study. The main pulmonary artery is dilated measuring approximately 3.3 cm. Aortic calcifications are noted. The heart size is stable. Coronary artery calcifications are noted. Mediastinum/Nodes: --No mediastinal or hilar lymphadenopathy. --No axillary lymphadenopathy. --No supraclavicular lymphadenopathy. --Normal thyroid gland. --The esophagus is unremarkable Lungs/Pleura: There are worsening bibasilar airspace opacities. There is no pneumothorax. There are trace bilateral pleural effusions, right greater than left. The trachea is unremarkable. Upper Abdomen: No acute abnormality. Musculoskeletal: No chest wall abnormality. No acute or significant osseous findings. Review of the MIP images confirms the above findings. IMPRESSION: 1. Improving subsegmental pulmonary emboli involving the left lower lobe. 2. Worsening bibasilar airspace opacities which may represent atelectasis or infiltrates. 3. Trace bilateral pleural effusions. 4. Dilated main pulmonary artery which can be seen in patients with elevated pulmonary artery pressures. Aortic Atherosclerosis (ICD10-I70.0). Electronically Signed   By: Constance Holster M.D.   On: 12/20/2018 01:09   Ct Angio Chest Pe W And/or Wo Contrast  Result Date: 11/21/2018 CLINICAL DATA:  Chest pain, shortness of breath and diffuse, acute abdominal pain. EXAM: CT ANGIOGRAPHY CHEST CT ABDOMEN AND PELVIS WITH CONTRAST TECHNIQUE: Multidetector CT imaging of the chest was performed using the standard protocol during bolus administration of intravenous contrast. Multiplanar CT image reconstructions and MIPs were obtained to evaluate the vascular anatomy. Multidetector CT imaging of  the abdomen and pelvis was performed using the standard protocol during bolus administration of intravenous contrast. CONTRAST:  71mL OMNIPAQUE IOHEXOL 350 MG/ML SOLN COMPARISON:  Portable chest obtained earlier today. Chest CT dated 07/15/2018. Abdomen and pelvis CT dated 05/04/2017. FINDINGS: CTA CHEST FINDINGS Cardiovascular: Enlarged central pulmonary arteries. The main pulmonary artery measures 3.6 cm in diameter. The pulmonary arteries are normally opacified with a very small filling defect noted in a left lower lobe pulmonary artery branch on image number 136 series 3, 86 series 7 and 112 series 8. There is a smaller filling defect more inferiorly in a branch of that pulmonary artery on image number 151 series 3. No other pulmonary arterial filling defects are seen. No evidence of right ventricular strain. Minimal pericardial effusion with a maximum thickness of 15 mm anteriorly and inferiorly. Dense atheromatous calcifications, including extensive coronary artery calcifications. Mediastinum/Nodes: No enlarged mediastinal, hilar, or axillary lymph nodes. Thyroid gland, trachea, and esophagus demonstrate no significant findings. Lungs/Pleura: Mild bibasilar subsegmental atelectasis. Minimal bilateral pleural fluid. Musculoskeletal: Thoracic and lower cervical spine degenerative changes. Review of the MIP images confirms the above findings. CT ABDOMEN and PELVIS FINDINGS Hepatobiliary: No focal liver abnormality is seen. Status post cholecystectomy. No biliary dilatation. Pancreas: Unremarkable. No pancreatic ductal dilatation or surrounding inflammatory changes. Spleen: Normal in size without focal abnormality. Adrenals/Urinary  Tract: The previously demonstrated 1.8 cm left adrenal mass containing a coarse curvilinear calcification currently measures 1.6 x 1.3 cm on image number 23 series 4. Stable normal appearing right adrenal gland period. Stable small left renal cyst. The left kidney remains somewhat  small. Both kidneys have small fetal lobulations. Tiny calculus in the inferior right kidney. There are also 2 tiny right renal cysts. Unremarkable ureters and urinary bladder. Stomach/Bowel: Stomach is within normal limits. Appendix appears normal. No evidence of bowel wall thickening, distention, or inflammatory changes. Vascular/Lymphatic: Extensive atheromatous arterial calcifications. The previously demonstrated 4.3 cm infrarenal abdominal aortic aneurysm currently measures 4.5 cm in maximum diameter on image number 39 series 4. This is not involving the iliac arteries. Reproductive: Uterus and bilateral adnexa are unremarkable. Other: None. Musculoskeletal: Lower thoracic spine degenerative changes and mild lumbar spine degenerative changes. Review of the MIP images confirms the above findings. IMPRESSION: 1. Two small left lower lobe pulmonary emboli. 2. Mild bibasilar subsegmental atelectasis. 3. Minimal bilateral pleural fluid. 4. Minimal pericardial effusion. 5. Extensive calcified coronary artery atherosclerosis and aortic atherosclerosis. 6. Interval mild increase in size of the previously demonstrated 4.3 cm infrarenal abdominal aortic aneurysm, currently measuring 4.5 cm in maximum diameter. 7. Tiny, nonobstructing right renal calculus. Critical Value/emergent results were called by telephone at the time of interpretation on 11/21/2018 at 6:58 pm to providerPHILLIP STAFFORD , who verbally acknowledged these results. Electronically Signed   By: Claudie Revering M.D.   On: 11/21/2018 19:05   Ct Abdomen Pelvis W Contrast  Result Date: 11/21/2018 CLINICAL DATA:  Chest pain, shortness of breath and diffuse, acute abdominal pain. EXAM: CT ANGIOGRAPHY CHEST CT ABDOMEN AND PELVIS WITH CONTRAST TECHNIQUE: Multidetector CT imaging of the chest was performed using the standard protocol during bolus administration of intravenous contrast. Multiplanar CT image reconstructions and MIPs were obtained to evaluate the  vascular anatomy. Multidetector CT imaging of the abdomen and pelvis was performed using the standard protocol during bolus administration of intravenous contrast. CONTRAST:  73mL OMNIPAQUE IOHEXOL 350 MG/ML SOLN COMPARISON:  Portable chest obtained earlier today. Chest CT dated 07/15/2018. Abdomen and pelvis CT dated 05/04/2017. FINDINGS: CTA CHEST FINDINGS Cardiovascular: Enlarged central pulmonary arteries. The main pulmonary artery measures 3.6 cm in diameter. The pulmonary arteries are normally opacified with a very small filling defect noted in a left lower lobe pulmonary artery branch on image number 136 series 3, 86 series 7 and 112 series 8. There is a smaller filling defect more inferiorly in a branch of that pulmonary artery on image number 151 series 3. No other pulmonary arterial filling defects are seen. No evidence of right ventricular strain. Minimal pericardial effusion with a maximum thickness of 15 mm anteriorly and inferiorly. Dense atheromatous calcifications, including extensive coronary artery calcifications. Mediastinum/Nodes: No enlarged mediastinal, hilar, or axillary lymph nodes. Thyroid gland, trachea, and esophagus demonstrate no significant findings. Lungs/Pleura: Mild bibasilar subsegmental atelectasis. Minimal bilateral pleural fluid. Musculoskeletal: Thoracic and lower cervical spine degenerative changes. Review of the MIP images confirms the above findings. CT ABDOMEN and PELVIS FINDINGS Hepatobiliary: No focal liver abnormality is seen. Status post cholecystectomy. No biliary dilatation. Pancreas: Unremarkable. No pancreatic ductal dilatation or surrounding inflammatory changes. Spleen: Normal in size without focal abnormality. Adrenals/Urinary Tract: The previously demonstrated 1.8 cm left adrenal mass containing a coarse curvilinear calcification currently measures 1.6 x 1.3 cm on image number 23 series 4. Stable normal appearing right adrenal gland period. Stable small left  renal cyst. The left kidney remains somewhat small.  Both kidneys have small fetal lobulations. Tiny calculus in the inferior right kidney. There are also 2 tiny right renal cysts. Unremarkable ureters and urinary bladder. Stomach/Bowel: Stomach is within normal limits. Appendix appears normal. No evidence of bowel wall thickening, distention, or inflammatory changes. Vascular/Lymphatic: Extensive atheromatous arterial calcifications. The previously demonstrated 4.3 cm infrarenal abdominal aortic aneurysm currently measures 4.5 cm in maximum diameter on image number 39 series 4. This is not involving the iliac arteries. Reproductive: Uterus and bilateral adnexa are unremarkable. Other: None. Musculoskeletal: Lower thoracic spine degenerative changes and mild lumbar spine degenerative changes. Review of the MIP images confirms the above findings. IMPRESSION: 1. Two small left lower lobe pulmonary emboli. 2. Mild bibasilar subsegmental atelectasis. 3. Minimal bilateral pleural fluid. 4. Minimal pericardial effusion. 5. Extensive calcified coronary artery atherosclerosis and aortic atherosclerosis. 6. Interval mild increase in size of the previously demonstrated 4.3 cm infrarenal abdominal aortic aneurysm, currently measuring 4.5 cm in maximum diameter. 7. Tiny, nonobstructing right renal calculus. Critical Value/emergent results were called by telephone at the time of interpretation on 11/21/2018 at 6:58 pm to providerPHILLIP STAFFORD , who verbally acknowledged these results. Electronically Signed   By: Claudie Revering M.D.   On: 11/21/2018 19:05   US Venous Img Lower Bilateral  Result Date: 11/22/2018 CLINICAL DATA:  Bilateral lower extremity pain and edema. History of previous DVT and pulmonary embolism. Former smoker. Patient is currently on anticoagulation. Evaluate for acute or chronic DVT. EXAM: BILATERAL LOWER EXTREMITY VENOUS DOPPLER ULTRASOUND TECHNIQUE: Gray-scale sonography with graded compression, as well  as color Doppler and duplex ultrasound were performed to evaluate the lower extremity deep venous systems from the level of the common femoral vein and including the common femoral, femoral, profunda femoral, popliteal and calf veins including the posterior tibial, peroneal and gastrocnemius veins when visible. The superficial great saphenous vein was also interrogated. Spectral Doppler was utilized to evaluate flow at rest and with distal augmentation maneuvers in the common femoral, femoral and popliteal veins. COMPARISON:  Bilateral lower extremity venous Doppler ultrasound-07/15/2018 FINDINGS: RIGHT LOWER EXTREMITY Common Femoral Vein: No evidence of thrombus. Normal compressibility, respiratory phasicity and response to augmentation. Saphenofemoral Junction: No evidence of thrombus. Normal compressibility and flow on color Doppler imaging. Profunda Femoral Vein: No evidence of thrombus. Normal compressibility and flow on color Doppler imaging. Femoral Vein: No evidence of thrombus. Normal compressibility, respiratory phasicity and response to augmentation. Popliteal Vein: No evidence of thrombus. Normal compressibility, respiratory phasicity and response to augmentation. Calf Veins: No evidence of thrombus. Normal compressibility and flow on color Doppler imaging. Superficial Great Saphenous Vein: No evidence of thrombus. Normal compressibility. Venous Reflux:  None. Other Findings:  None. LEFT LOWER EXTREMITY Common Femoral Vein: No evidence of thrombus. Normal compressibility, respiratory phasicity and response to augmentation. Saphenofemoral Junction: No evidence of thrombus. Normal compressibility and flow on color Doppler imaging. Profunda Femoral Vein: No evidence of thrombus. Normal compressibility and flow on color Doppler imaging. Femoral Vein: No evidence of thrombus. Normal compressibility, respiratory phasicity and response to augmentation. Popliteal Vein: No evidence of thrombus. Normal  compressibility, respiratory phasicity and response to augmentation. Calf Veins: No evidence of thrombus. Normal compressibility and flow on color Doppler imaging. Superficial Great Saphenous Vein: No evidence of thrombus. Normal compressibility. Venous Reflux:  None. Other Findings:  None. IMPRESSION: No evidence of acute or chronic DVT within either lower extremity. Electronically Signed   By: Sandi Mariscal M.D.   On: 11/22/2018 12:00   Dg Chest Portable 1 View  Result Date: 11/21/2018 CLINICAL DATA:  Short of breath, chest pain EXAM: PORTABLE CHEST 1 VIEW COMPARISON:  Radiograph 09/06/2010 FINDINGS: Stable enlarged cardiac silhouette. Low lung volumes. Mild venous congestion. No focal infiltrate. LEFT lung base poorly evaluated. IMPRESSION: 1. Cardiomegaly and mild venous congestion. 2. Low lung volumes. 3. No focal infiltrate. Electronically Signed   By: Suzy Bouchard M.D.   On: 11/21/2018 15:34       Subjective: No epistaxis overnight.  She reports she is sleepy.  She has no dyspnea, chest pain, vomiting, coughing, hemoptysis.  Discharge Exam: Vitals:   12/21/18 1116 12/21/18 1336  BP: (!) 125/91 (!) 151/96  Pulse: 99 93  Resp: (!) 31 (!) 23  Temp:  97.7 F (36.5 C)  SpO2: 96% 99%   Vitals:   12/21/18 0900 12/21/18 1000 12/21/18 1116 12/21/18 1336  BP: (!) 145/93 (!) 160/98 (!) 125/91 (!) 151/96  Pulse: 84 80 99 93  Resp: 19 20 (!) 31 (!) 23  Temp:    97.7 F (36.5 C)  TempSrc:      SpO2: 98% 99% 96% 99%  Weight:      Height:        General: Pt is sleeping, slowly arousable, falls back asleep, not in acute distress Cardiovascular: RRR, nl S1-S2, no murmurs appreciated.   No LE edema.   Respiratory: Normal respiratory rate and rhythm.  CTAB without rales or wheezes. Abdominal: Abdomen soft and non-tender.  No distension or HSM.   Neuro/Psych: Strength symmetric in upper and lower extremities, very weak.  Judgment and insight appear impaired.   The results of  significant diagnostics from this hospitalization (including imaging, microbiology, ancillary and laboratory) are listed below for reference.     Microbiology: Recent Results (from the past 240 hour(s))  SARS CORONAVIRUS 2 (TAT 6-24 HRS) Nasopharyngeal Nasopharyngeal Swab     Status: Abnormal   Collection Time: 12/19/18 11:24 PM   Specimen: Nasopharyngeal Swab  Result Value Ref Range Status   SARS Coronavirus 2 POSITIVE (A) NEGATIVE Final    Comment: RESULT CALLED TO, READ BACK BY AND VERIFIED WITH: Henderson Newcomer RN 15:20 12/20/18 ([email or web address]) (NOTE) SARS-CoV-2 target nucleic acids are DETECTED. The SARS-CoV-2 RNA is generally detectable in upper and lower respiratory specimens during the acute phase of infection. Positive results are indicative of active infection with SARS-CoV-2. Clinical  correlation with patient history and other diagnostic information is necessary to determine patient infection status. Positive results do  not rule out bacterial infection or co-infection with other viruses. The expected result is Negative. Fact Sheet for Patients: SugarRoll.be Fact Sheet for Healthcare Providers: https://www.woods-mathews.com/ This test is not yet approved or cleared by the [place] FDA and  has been authorized for detection and/or diagnosis of SARS-CoV-2 by FDA under an Emergency Use Authorization (EUA). This EUA will remain  in effect (meaning this test can be used) for  the duration of the COVID-19 declaration under Section 564(b)(1) of the Act, 21 U.S.C. section 360bbb-3(b)(1), unless the authorization is terminated or revoked sooner. Performed at [facility], [place] [address] [place], [place] [address]   MRSA PCR Screening     Status: None   Collection Time: 12/21/18  8:15 AM   Specimen: Nasal Mucosa; Nasopharyngeal  Result Value Ref Range Status   MRSA by PCR NEGATIVE NEGATIVE Final    Comment:        The GeneXpert MRSA  Assay (FDA approved for NASAL specimens only), is one component of a comprehensive MRSA  colonization surveillance program. It is not intended to diagnose MRSA infection nor to guide or monitor treatment for MRSA infections. Performed at [facility], [address] [place], [place] [address]      Labs: BNP (last 3 results) Recent Labs    04/13/18 1317 07/15/18 0926 12/19/18 2125  BNP 99.0 92.0 [id number]   Basic Metabolic Panel: Recent Labs  Lab 12/19/18 2125 12/21/18 0721  NA 136 134*  K 4.1 4.3  CL 105 103  CO2 24 22  GLUCOSE 97 131*  BUN 19 17  CREATININE 1.45* 1.05*  CALCIUM 8.6* 8.5*  MG  --  1.8   Liver Function Tests: Recent Labs  Lab 12/19/18 2125 12/21/18 0721  AST 89* 70*  ALT 33 31  ALKPHOS 257* 242*  BILITOT 0.8 0.7  PROT 6.8 6.8  ALBUMIN 2.1* 2.0*   No results for input(s): LIPASE, AMYLASE in the last 168 hours. No results for input(s): AMMONIA in the last 168 hours. CBC: Recent Labs  Lab 12/19/18 2125 12/20/18 0947 12/21/18 0721  WBC 2.9* 2.9* 1.5*  NEUTROABS 1.6*  --  0.9*  HGB 8.0* 7.9* 8.0*  HCT 25.4* 25.4* 25.4*  MCV 83.3 84.1 84.4  PLT 235 265 284   Cardiac Enzymes: No results for input(s): CKTOTAL, CKMB, CKMBINDEX, TROPONINI in the last 168 hours. BNP: Invalid input(s): POCBNP CBG: Recent Labs  Lab 12/20/18 1626 12/20/18 1927 12/21/18 0134 12/21/18 0820 12/21/18 1215  GLUCAP 131* 88 128* 119* 114*   D-Dimer No results for input(s): DDIMER in the last 72 hours. Hgb A1c No results for input(s): HGBA1C in the last 72 hours. Lipid Profile No results for input(s): CHOL, HDL, LDLCALC, TRIG, CHOLHDL, LDLDIRECT in the last 72 hours. Thyroid function studies No results for input(s): TSH, T4TOTAL, T3FREE, THYROIDAB in the last 72 hours.  Invalid input(s): FREET3 Anemia work up Recent Labs    12/20/18 0947 12/20/18 1925 12/21/18 0721  FERRITIN  --  63 94  RETICCTPCT 2.6  --   --    Urinalysis    Component  Value Date/Time   COLORURINE YELLOW (A) 09/06/2018 1222   APPEARANCEUR CLOUDY (A) 09/06/2018 1222   APPEARANCEUR Clear 02/20/2013 1459   LABSPEC 1.014 09/06/2018 1222   LABSPEC 1.012 02/20/2013 1459   PHURINE 5.0 09/06/2018 1222   GLUCOSEU NEGATIVE 09/06/2018 1222   GLUCOSEU Negative 02/20/2013 1459   HGBUR NEGATIVE 09/06/2018 1222   BILIRUBINUR NEGATIVE 09/06/2018 1222   BILIRUBINUR Negative 02/20/2013 1459   KETONESUR NEGATIVE 09/06/2018 1222   PROTEINUR NEGATIVE 09/06/2018 1222   NITRITE NEGATIVE 09/06/2018 1222   LEUKOCYTESUR LARGE (A) 09/06/2018 1222   LEUKOCYTESUR Negative 02/20/2013 1459   Sepsis Labs Invalid input(s): PROCALCITONIN,  WBC,  LACTICIDVEN Microbiology Recent Results (from the past 240 hour(s))  SARS CORONAVIRUS 2 (TAT 6-24 HRS) Nasopharyngeal Nasopharyngeal Swab     Status: Abnormal   Collection Time: 12/19/18 11:24 PM   Specimen: Nasopharyngeal Swab  Result Value Ref Range Status   SARS Coronavirus 2 POSITIVE (A) NEGATIVE Final    Comment: RESULT CALLED TO, READ BACK BY AND VERIFIED WITH: Henderson Newcomer RN 15:20 12/20/18 ([email or web address]) (NOTE) SARS-CoV-2 target nucleic acids are DETECTED. The SARS-CoV-2 RNA is generally detectable in upper and lower respiratory specimens during the acute phase of infection. Positive results are indicative of active infection with SARS-CoV-2. Clinical  correlation with patient history and other diagnostic information is necessary to determine patient infection status. Positive results do  not rule out bacterial infection or co-infection with  other viruses. The expected result is Negative. Fact Sheet for Patients: SugarRoll.be Fact Sheet for Healthcare Providers: https://www.woods-mathews.com/ This test is not yet approved or cleared by the [place] FDA and  has been authorized for detection and/or diagnosis of SARS-CoV-2 by FDA under an Emergency Use Authorization (EUA). This EUA will  remain  in effect (meaning this test can be used) for  the duration of the COVID-19 declaration under Section 564(b)(1) of the Act, 21 U.S.C. section 360bbb-3(b)(1), unless the authorization is terminated or revoked sooner. Performed at [facility], [place] [address] [place], [place] [address]   MRSA PCR Screening     Status: None   Collection Time: 12/21/18  8:15 AM   Specimen: Nasal Mucosa; Nasopharyngeal  Result Value Ref Range Status   MRSA by PCR NEGATIVE NEGATIVE Final    Comment:        The GeneXpert MRSA Assay (FDA approved for NASAL specimens only), is one component of a comprehensive MRSA colonization surveillance program. It is not intended to diagnose MRSA infection nor to guide or monitor treatment for MRSA infections. Performed at [facility], [address], [place], [place] [address]      Time coordinating discharge: 25 minutes      SIGNED:   Edwin Dada, MD  [facility] 12/21/2018, 1:40 PM

## 2018-12-21 NOTE — ED Notes (Signed)
AVS printed due to hard stop when attempting to discharge patient off board. Pt transferred to [facility] via [facility] at this time.

## 2018-12-21 NOTE — ED Notes (Signed)
Dr. Danford at bedside at this time.  °

## 2018-12-21 NOTE — Progress Notes (Signed)
ANTICOAGULATION CONSULT NOTE - Initial Consult  Pharmacy Consult for Heparin IV Indication: pulmonary embolus  Allergies  Allergen Reactions  . Bee Venom Swelling  . Enalapril Maleate Swelling    Patient Measurements:   Heparin Dosing Weight: 76.6 kg  Vital Signs: Temp: 97.7 F (36.5 C) (11/07 1336) BP: 151/96 (11/07 1336) Pulse Rate: 93 (11/07 1336)  Labs: Recent Labs    12/19/18 2125 12/20/18 0947 12/21/18 0721  HGB 8.0* 7.9* 8.0*  HCT 25.4* 25.4* 25.4*  PLT 235 265 284  APTT  --  39* >160*  LABPROT 17.3* 15.9*  --   INR 1.4* 1.3*  --   HEPARINUNFRC  --   --  2.18*  CREATININE 1.45*  --  1.05*    Estimated Creatinine Clearance: 51.3 mL/min (A) (by C-G formula based on SCr of 1.05 mg/dL (H)).   Medical History: Past Medical History:  Diagnosis Date  . Angioedema   . Anxiety and depression   . CHF (congestive heart failure) ([place])   . Chronic constipation   . Chronic kidney disease    stage 3  . COPD (chronic obstructive pulmonary disease) ([place])   . Diabetes mellitus without complication ([place])   . Gallstones   . GERD (gastroesophageal reflux disease)   . Hypertension   . Left ventricular hypertrophy   . Osteoarthritis   . Prurigo nodularis   . Seizures ([place])   . Stroke ([place])   . Tobacco abuse   . Vitamin D deficiency disease     Medications:  Scheduled:  . atorvastatin  80 mg Oral QPM  . cholecalciferol  1,000 Units Oral Daily  . dexamethasone (DECADRON) injection  6 mg Intravenous Q24H  . docusate sodium  100 mg Oral BID  . famotidine  40 mg Oral Daily  . gabapentin  300 mg Oral QHS  . hydrALAZINE  25 mg Oral TID  . insulin aspart  0-5 Units Subcutaneous QHS  . insulin aspart  0-9 Units Subcutaneous TID WC  . levETIRAcetam  500 mg Oral Daily  . linagliptin  5 mg Oral Daily  . multivitamin with minerals  1 tablet Oral Daily  . cyanocobalamin  1,000 mcg Oral Daily   Infusions:  . remdesivir 100 mg in NS 250 mL      Assessment: 72 yoF  admitted on 11/7 with COVID-19 pneumonia, hemoptysis, epistaxis.  PMH is significant for recent PE in October, 2020 on apixaban prior to admission.  Pharmacy is consulted to dose IV Heparin inpatient. Last dose of apixaban 5mg  PO BID taken on 11/4 AM per facility [facility].  She was started on heparin IV at [facility], initially started at 1200 units/hr with aPTT > 160, and HL 2.18.  Heparin was reduced to 1000 units/hr and continued on transfer to [place].  Ordered APTT, and Heparin level (may be falsely elevated d/t apixaban, but is now > 72 hours after last dose.) CBC: Hgb remains low/stable at 8, Plt wnl. RN at [place] reports no bleeding or complications on arrival from [facility] to [place] unit.   Goal of Therapy:  Heparin level 0.3-0.7 units/ml Monitor platelets by anticoagulation protocol: Yes   Plan:  HL and APTT ordered (~6 hour level) No heparin bolus d/t bleeding. Continue heparin IV infusion at 1000 units/hr 6 hour Heparin level, APTT  Daily heparin level, aptt, and CBC Continue to monitor H&H and platelets  Gretta Arab PharmD, BCPS Clinical pharmacist phone 7am- 5pm: [phone] 12/21/2018 3:39 PM

## 2018-12-21 NOTE — Consult Note (Signed)
ANTICOAGULATION CONSULT NOTE - Initial Consult  Pharmacy Consult for Heparin Indication: pulmonary embolus  Allergies  Allergen Reactions  . Bee Venom Swelling  . Enalapril Maleate Swelling    Patient Measurements: Height: 5\' 3"  (160 cm) Weight: 226 lb (102.5 kg) IBW/kg (Calculated) : 52.4 Heparin Dosing Weight: 76.6  Vital Signs: BP: 146/97 (11/07 0800) Pulse Rate: 81 (11/07 0800)  Labs: Recent Labs    12/19/18 2125 12/20/18 0947 12/21/18 0721  HGB 8.0* 7.9* 8.0*  HCT 25.4* 25.4* 25.4*  PLT 235 265 284  APTT  --  39* >160*  LABPROT 17.3* 15.9*  --   INR 1.4* 1.3*  --   HEPARINUNFRC  --   --  2.18*  CREATININE 1.45*  --  1.05*    Estimated Creatinine Clearance: 51.3 mL/min (A) (by C-G formula based on SCr of 1.05 mg/dL (H)).   Medical History: Past Medical History:  Diagnosis Date  . Angioedema   . Anxiety and depression   . CHF (congestive heart failure) ([place])   . Chronic constipation   . Chronic kidney disease    stage 3  . COPD (chronic obstructive pulmonary disease) ([place])   . Diabetes mellitus without complication ([place])   . Gallstones   . GERD (gastroesophageal reflux disease)   . Hypertension   . Left ventricular hypertrophy   . Osteoarthritis   . Prurigo nodularis   . Seizures ([place])   . Stroke ([place])   . Tobacco abuse   . Vitamin D deficiency disease     Medications:  Apixaban prior to admission (last dose 11/5 AM per facility)  Assessment: Patient is a 78 y/o F with recently diagnosed PE in October on apixaban prior to admission. Patient presenting c/o hemoptysis and epistaxis. COVID + diagnosis at hospital. Pharmacy consulted for heparin drip for PE.   Anemic with Hgb of 7.9 g/dL however baseline appears to be 8-9 g/dL. INR slightly elevated to 1.3 and aPTT 39. No baseline heparin level obtain.   11/7 0721 aPTT > 160 HL 2.18  Goal of Therapy:  aPTT 66-102 seconds Monitor platelets by anticoagulation protocol: Yes   Plan:  APTT is  supratherapeutic - Plan to hold heparin for 30 minutes and decrease heparin infusion to 1000 units/hr, no bolus in view of recent bleeding.  -aPTT 8 hours after rate change. Will follow aPTT for now considering apixaban interference with heparin level.  -Daily HL/CBC per protocol  Oswald Hillock, PharmD, BCPS Pharmacy Resident 12/21/2018,8:59 AM

## 2018-12-21 NOTE — ED Notes (Signed)
Lab at bedside to draw labs.

## 2018-12-21 NOTE — ED Notes (Signed)
Pt heparin level is increased, pharmacy called and told this nurse to stop Heparin infusion for 30 minutes then modify heparin infusion to 1,000 units/hr.

## 2018-12-21 NOTE — ED Notes (Signed)
Pt meal tray placed at bedside at this time, pt states she doesn't want to eat at this time. Pt placed back on 2L via [employer] due to O2 sats 91% on RA.

## 2018-12-21 NOTE — ED Notes (Signed)
This nurse updated pt husband on pt condition. Informed him that the pt will be transferred to [facility] at some point today and that I will call him to give an update once we find out what time she will be transferred.

## 2018-12-22 ENCOUNTER — Other Ambulatory Visit: Payer: Self-pay

## 2018-12-22 ENCOUNTER — Encounter (HOSPITAL_COMMUNITY): Payer: Self-pay | Admitting: *Deleted

## 2018-12-22 LAB — COMPREHENSIVE METABOLIC PANEL
ALT: 30 U/L (ref 0–44)
AST: 58 U/L — ABNORMAL HIGH (ref 15–41)
Albumin: 2.1 g/dL — ABNORMAL LOW (ref 3.5–5.0)
Alkaline Phosphatase: 227 U/L — ABNORMAL HIGH (ref 38–126)
Anion gap: 8 (ref 5–15)
BUN: 20 mg/dL (ref 8–23)
CO2: 22 mmol/L (ref 22–32)
Calcium: 8.3 mg/dL — ABNORMAL LOW (ref 8.9–10.3)
Chloride: 103 mmol/L (ref 98–111)
Creatinine, Ser: 1.42 mg/dL — ABNORMAL HIGH (ref 0.44–1.00)
GFR calc Af Amer: 41 mL/min — ABNORMAL LOW (ref >60)
GFR calc non Af Amer: 36 mL/min — ABNORMAL LOW (ref >60)
Glucose, Bld: 114 mg/dL — ABNORMAL HIGH (ref 70–99)
Potassium: 4.1 mmol/L (ref 3.5–5.1)
Sodium: 133 mmol/L — ABNORMAL LOW (ref 135–145)
Total Bilirubin: 0.5 mg/dL (ref 0.3–1.2)
Total Protein: 6.9 g/dL (ref 6.5–8.1)

## 2018-12-22 LAB — CBC WITH DIFFERENTIAL/PLATELET
Abs Immature Granulocytes: 0.02 10*3/uL (ref 0.00–0.07)
Basophils Absolute: 0 10*3/uL (ref 0.0–0.1)
Basophils Relative: 0 %
Eosinophils Absolute: 0 10*3/uL (ref 0.0–0.5)
Eosinophils Relative: 0 %
HCT: 26.6 % — ABNORMAL LOW (ref 36.0–46.0)
Hemoglobin: 8.4 g/dL — ABNORMAL LOW (ref 12.0–15.0)
Immature Granulocytes: 1 %
Lymphocytes Relative: 32 %
Lymphs Abs: 0.8 10*3/uL (ref 0.7–4.0)
MCH: 26.8 pg (ref 26.0–34.0)
MCHC: 31.6 g/dL (ref 30.0–36.0)
MCV: 84.7 fL (ref 80.0–100.0)
Monocytes Absolute: 0.2 10*3/uL (ref 0.1–1.0)
Monocytes Relative: 7 %
Neutro Abs: 1.6 10*3/uL — ABNORMAL LOW (ref 1.7–7.7)
Neutrophils Relative %: 60 %
Platelets: 320 10*3/uL (ref 150–400)
RBC: 3.14 MIL/uL — ABNORMAL LOW (ref 3.87–5.11)
RDW: 19.7 % — ABNORMAL HIGH (ref 11.5–15.5)
WBC: 2.6 10*3/uL — ABNORMAL LOW (ref 4.0–10.5)
nRBC: 0 % (ref 0.0–0.2)

## 2018-12-22 LAB — APTT
aPTT: 185 seconds (ref 24–36)
aPTT: 142 seconds — ABNORMAL HIGH (ref 24–36)

## 2018-12-22 LAB — MAGNESIUM: Magnesium: 1.6 mg/dL — ABNORMAL LOW (ref 1.7–2.4)

## 2018-12-22 LAB — GLUCOSE, CAPILLARY
Glucose-Capillary: 111 mg/dL — ABNORMAL HIGH (ref 70–99)
Glucose-Capillary: 127 mg/dL — ABNORMAL HIGH (ref 70–99)
Glucose-Capillary: 94 mg/dL (ref 70–99)
Glucose-Capillary: 122 mg/dL — ABNORMAL HIGH (ref 70–99)

## 2018-12-22 LAB — C-REACTIVE PROTEIN: CRP: 5.3 mg/dL — ABNORMAL HIGH (ref <1.0)

## 2018-12-22 LAB — HEMOGLOBIN A1C
Hgb A1c MFr Bld: 5.1 % (ref 4.8–5.6)
Mean Plasma Glucose: 99.67 mg/dL

## 2018-12-22 LAB — HEPARIN LEVEL (UNFRACTIONATED): Heparin Unfractionated: 1.04 IU/mL — ABNORMAL HIGH (ref 0.30–0.70)

## 2018-12-22 LAB — CLOSTRIDIUM DIFFICILE BY PCR, REFLEXED: Toxigenic C. Difficile by PCR: NEGATIVE

## 2018-12-22 LAB — BRAIN NATRIURETIC PEPTIDE: B Natriuretic Peptide: 124.7 pg/mL — ABNORMAL HIGH (ref 0.0–100.0)

## 2018-12-22 LAB — ABO/RH: ABO/RH(D): O POS

## 2018-12-22 LAB — D-DIMER, QUANTITATIVE: D-Dimer, Quant: 8.32 ug/mL-FEU — ABNORMAL HIGH (ref 0.00–0.50)

## 2018-12-22 MED ORDER — HEPARIN (PORCINE) 25000 UT/250ML-% IV SOLN: 800.0000 [IU]/h | INTRAVENOUS | Status: DC

## 2018-12-22 MED ORDER — APIXABAN 5 MG PO TABS
5.0000 mg | ORAL_TABLET | Freq: Two times a day (BID) | ORAL | Status: DC
  Administered 2018-12-22 – 2018-12-24 (×5): 5 mg via ORAL
  Filled 2018-12-22 (×4): qty 1

## 2018-12-22 NOTE — Progress Notes (Signed)
ANTICOAGULATION CONSULT NOTE - Follow Up Consult  Pharmacy Consult for Heparin> Apixaban Indication: pulmonary embolus (PTA apixaban)  Allergies  Allergen Reactions  . Bee Venom Swelling  . Enalapril Maleate Swelling    Patient Measurements: Height: 5\' 3"  (160 cm) IBW/kg (Calculated) : 52.4 Heparin Dosing Weight: 76.6 kg   Vital Signs: Temp: 99.1 F (37.3 C) (11/08 0618) Temp Source: Oral (11/08 0618) BP: 123/82 (11/08 0618) Pulse Rate: 87 (11/08 0618)  Labs: Recent Labs    12/19/18 2125  12/20/18 0947 12/21/18 0721 12/21/18 1517 12/21/18 2220  HGB 8.0*  --  7.9* 8.0*  --   --   HCT 25.4*  --  25.4* 25.4*  --   --   PLT 235  --  265 284  --   --   APTT  --    < > 39* >160* 98* 185*  LABPROT 17.3*  --  15.9*  --   --   --   INR 1.4*  --  1.3*  --   --   --   HEPARINUNFRC  --   --   --  2.18* 1.32*  --   CREATININE 1.45*  --   --  1.05*  --   --    < > = values in this interval not displayed.    Estimated Creatinine Clearance: 51.3 mL/min (A) (by C-G formula based on SCr of 1.05 mg/dL (H)).   Medications:  Infusions:  . heparin 800 Units/hr (12/22/18 0316)  . remdesivir 100 mg in NS 250 mL 100 mg (12/21/18 1724)    Assessment: 89 yoF admitted on 11/7 with COVID-19 pneumonia, hemoptysis, epistaxis.  PMH is significant for recent PE in October, 2020 on apixaban prior to admission.  Pharmacy is consulted to dose IV Heparin inpatient while holding apixaban for bleeding.  Now, pharmacy is consulted to resume apixaban dosing. Last dose of apixaban 5mg  PO BID taken on 11/4 AM per facility [facility].  Heparin level and APTT are not resulted at this time.  Labs were drawn at 9:40 and remain in process at 13:00. CBC: Hgb remains low/stable at 8.4 and Plt remain WNL at 320k. SCr 1.4 with CrCl ~ 38 ml/min Bleeding:  No further epistaxis reported.   Goal of Therapy:  Heparin level 0.3-0.7 units/ml aPTT 66-102 seconds Monitor platelets by anticoagulation protocol: Yes    Plan:  D/C heparin IV Resume PTA apixaban 5 mg PO BID. Pharmacy to sign off consult, but will continue to peripherally follow.  Gretta Arab PharmD, BCPS Clinical pharmacist phone 7am- 5pm: [phone] 12/22/2018 6:57 AM

## 2018-12-22 NOTE — Progress Notes (Signed)
PROGRESS NOTE                                                                                                                                                                                                             Patient Demographics:    Katherine [name], is a 78 y.o. female, DOB - Jun 22, 1940, TAV:[id number]  Outpatient Primary MD for the patient is Valerie Roys, DO    LOS - 1  Admit date - 12/21/2018    Chief complaint.  Nosebleed.     Brief Narrative -  Katherine [name]  is a 78 y.o. female, with history of recent PE diagnosed November 21, 2018 currently on Eliquis, generalized weakness and deconditioning, virtually bedbound and nursing home resident for several months, CKD 3, DM type II, hypertension, COPD, GERD, seizures, obesity, CVA who lives in a nursing home and initially presented to [facility] ER with chief complaints of nosebleeding.  She was found to have epistaxis and it was treated.  Subsequently testing suggested that she had COVID-19 infection and sent to [facility] for further care.  Patient is currently relatively symptom-free.  Denies any headache, no chest pain shortness of breath.  No productive cough.  No focal weakness which is new.   Subjective:    Katherine [name] today has, No headache, No chest pain, No abdominal pain - No Nausea, No new weakness tingling or numbness, no cough or shortness of breath, improved diarrhea.   Assessment  & Plan :     1.  Acute Covid 19 Viral Pneumonitis during the ongoing 2020 Covid 19 Pandemic -mild disease currently on 1 L nasal cannula oxygen and can be titrated down to room air soon.  Continue IV steroids and remdesivir.  Monitor clinically.  Encouraged to sit up in chair and daytime use I-S and flutter valve for pulmonary toiletry and prone in bed at night if possible.   COVID-19 Labs  Recent Labs    12/20/18 1125 12/20/18 1925 12/21/18 0721  FERRITIN  --  63 94  LDH  148  --   --   CRP  --   --  8.9*    Lab Results  Component Value Date   SARSCOV2NAA POSITIVE (A) 12/19/2018   SARSCOV2NAA NOT DETECTED 11/24/2018   [place] NEGATIVE 11/21/2018   [place] NEGATIVE 09/06/2018     SpO2: 93 %  O2 Flow Rate (L/min): 1 L/min  Hepatic Function Latest Ref Rng & Units 12/21/2018 12/19/2018 11/21/2018  Total Protein 6.5 - 8.1 g/dL 6.8 6.8 7.3  Albumin 3.5 - 5.0 g/dL 2.0(L) 2.1(L) 2.3(L)  AST 15 - 41 U/L 70(H) 89(H) 101(H)  ALT 0 - 44 U/L 31 33 45(H)  Alk Phosphatase 38 - 126 U/L 242(H) 257(H) 233(H)  Total Bilirubin 0.3 - 1.2 mg/dL 0.7 0.8 0.6        Component Value Date/Time   BNP 63.0 12/19/2018 2125    2.  Recent PE diagnosed October 2020.  Due to recent nosebleed Eliquis held will continue heparin drip for now.  Nosebleed seems to have resolved.  3.  History of ESBL UTI.  She was started on Invanz on 12/19/2018.  Currently no signs of active infection, will rule out C. difficile as an #8 below thereafter if signs of any active infection will add antibiotics appropriately.  4.  Leukopenia.  Likely due to viral illness.  5.  History of CVA.  Continue statin for secondary prevention along with anticoagulation.  6.  Weakness, deconditioning, bedbound status.  Supportive care discharge back to SNF once better.  7.  GERD.  Switch PPI to Pepcid, rule out C. difficile first.  8.  Gastroenteritis.  Likely Covid related likely C. difficile colonized, PCR negative.  Diarrhea improved will monitor.  9.  CKD 2.  Baseline creatinine 1.2 will monitor.   10. DM type II.  Continue Tradjenta, placed on sliding scale here.    Lab Results  Component Value Date   HGBA1C 5.5 08/21/2018   CBG (last 3)  Recent Labs    12/21/18 1640 12/21/18 2145 12/22/18 0805  GLUCAP 138* 170* 111*     Condition - Fair  Family Communication  : Left for husband on 12/22/2018 at 10:52 AM  Code Status :  Full  Diet :   Diet Order            Diet Carb  Modified Fluid consistency: Thin; Room service appropriate? Yes  Diet effective now               Disposition Plan  :  SNF  Consults  :  None  Procedures  :     PUD Prophylaxis : pepcid  DVT Prophylaxis  :    Heparin gtt >> Eliquis  Lab Results  Component Value Date   PLT 284 12/21/2018    Inpatient Medications  Scheduled Meds: . atorvastatin  80 mg Oral QPM  . cholecalciferol  1,000 Units Oral Daily  . dexamethasone (DECADRON) injection  6 mg Intravenous Q24H  . docusate sodium  100 mg Oral BID  . famotidine  40 mg Oral Daily  . gabapentin  300 mg Oral QHS  . hydrALAZINE  25 mg Oral TID  . insulin aspart  0-5 Units Subcutaneous QHS  . insulin aspart  0-9 Units Subcutaneous TID WC  . levETIRAcetam  500 mg Oral Daily  . linagliptin  5 mg Oral Daily  . multivitamin with minerals  1 tablet Oral Daily  . cyanocobalamin  1,000 mcg Oral Daily   Continuous Infusions: . heparin 800 Units/hr (12/22/18 0316)  . remdesivir 100 mg in NS 250 mL 100 mg (12/21/18 1724)   PRN Meds:.acetaminophen, nitroGLYCERIN, ondansetron **OR** ondansetron (ZOFRAN) IV  Antibiotics  :    Anti-infectives (From admission, onward)   Start     Dose/Rate Route Frequency Ordered Stop   12/21/18 1600  remdesivir 100 mg  in sodium chloride 0.9 % 250 mL IVPB     100 mg 500 mL/hr over 30 Minutes Intravenous Every 24 hours 12/21/18 1454 12/25/18 1559       Time Spent in minutes  30   Lala Lund M.D on 12/22/2018 at 10:50 AM  To page go to [email or web address] - password [email or web address]  [facility] -  Office  [phone]    See all Orders from today for further details    Objective:   Vitals:   12/22/18 0500 12/22/18 0618 12/22/18 0700 12/22/18 0800  BP:  123/82 132/89 126/75  Pulse: 85 87  83  Resp: (!) 24 (!) 21  19  Temp:  99.1 F (37.3 C)  (!) 97.4 F (36.3 C)  TempSrc:  Oral  Oral  SpO2: 94% 95%  93%  Height:        Wt Readings from Last 3 Encounters:  12/19/18 102.5 kg   11/21/18 104 kg  11/01/18 104.1 kg     Intake/Output Summary (Last 24 hours) at 12/22/2018 1050 Last data filed at 12/22/2018 0600 Gross per 24 hour  Intake 891.87 ml  Output 0 ml  Net 891.87 ml     Physical Exam  Awake Alert,  No new F.N deficits, Normal affect [place].AT,PERRAL Supple Neck,No JVD, No cervical lymphadenopathy appriciated.  Symmetrical Chest wall movement, Good air movement bilaterally, CTAB RRR,No Gallops,Rubs or new Murmurs, No Parasternal Heave +ve B.Sounds, Abd Soft, No tenderness, No organomegaly appriciated, No rebound - guarding or rigidity. No Cyanosis, Clubbing or edema, No new Rash or bruise     Data Review:    CBC Recent Labs  Lab 12/19/18 2125 12/20/18 0947 12/21/18 0721  WBC 2.9* 2.9* 1.5*  HGB 8.0* 7.9* 8.0*  HCT 25.4* 25.4* 25.4*  PLT 235 265 284  MCV 83.3 84.1 84.4  MCH 26.2 26.2 26.6  MCHC 31.5 31.1 31.5  RDW 19.3* 19.3* 19.4*  LYMPHSABS 0.9  --  0.5*  MONOABS 0.3  --  0.1  EOSABS 0.1  --  0.0  BASOSABS 0.0  --  0.0    Chemistries  Recent Labs  Lab 12/19/18 2125 12/21/18 0721  NA 136 134*  K 4.1 4.3  CL 105 103  CO2 24 22  GLUCOSE 97 131*  BUN 19 17  CREATININE 1.45* 1.05*  CALCIUM 8.6* 8.5*  MG  --  1.8  AST 89* 70*  ALT 33 31  ALKPHOS 257* 242*  BILITOT 0.8 0.7   ------------------------------------------------------------------------------------------------------------------ No results for input(s): CHOL, HDL, LDLCALC, TRIG, CHOLHDL, LDLDIRECT in the last 72 hours.  Lab Results  Component Value Date   HGBA1C 5.5 08/21/2018   ------------------------------------------------------------------------------------------------------------------ No results for input(s): TSH, T4TOTAL, T3FREE, THYROIDAB in the last 72 hours.  Invalid input(s): FREET3  Cardiac Enzymes No results for input(s): CKMB, TROPONINI, MYOGLOBIN in the last 168 hours.  Invalid input(s): CK  ------------------------------------------------------------------------------------------------------------------    Component Value Date/Time   BNP 63.0 12/19/2018 2125    Micro Results Recent Results (from the past 240 hour(s))  SARS CORONAVIRUS 2 (TAT 6-24 HRS) Nasopharyngeal Nasopharyngeal Swab     Status: Abnormal   Collection Time: 12/19/18 11:24 PM   Specimen: Nasopharyngeal Swab  Result Value Ref Range Status   SARS Coronavirus 2 POSITIVE (A) NEGATIVE Final    Comment: RESULT CALLED TO, READ BACK BY AND VERIFIED WITH: Henderson Newcomer RN 15:20 12/20/18 ([email or web address]) (NOTE) SARS-CoV-2 target nucleic acids are DETECTED. The SARS-CoV-2 RNA is generally detectable in upper and lower respiratory specimens during  the acute phase of infection. Positive results are indicative of active infection with SARS-CoV-2. Clinical  correlation with patient history and other diagnostic information is necessary to determine patient infection status. Positive results do  not rule out bacterial infection or co-infection with other viruses. The expected result is Negative. Fact Sheet for Patients: SugarRoll.be Fact Sheet for Healthcare Providers: https://www.woods-mathews.com/ This test is not yet approved or cleared by the [place] FDA and  has been authorized for detection and/or diagnosis of SARS-CoV-2 by FDA under an Emergency Use Authorization (EUA). This EUA will remain  in effect (meaning this test can be used) for  the duration of the COVID-19 declaration under Section 564(b)(1) of the Act, 21 U.S.C. section 360bbb-3(b)(1), unless the authorization is terminated or revoked sooner. Performed at [facility], [place] [address], [place], [place] [address]   MRSA PCR Screening     Status: None   Collection Time: 12/21/18  8:15 AM   Specimen: Nasal Mucosa; Nasopharyngeal  Result Value Ref Range Status   MRSA by PCR NEGATIVE NEGATIVE Final     Comment:        The GeneXpert MRSA Assay (FDA approved for NASAL specimens only), is one component of a comprehensive MRSA colonization surveillance program. It is not intended to diagnose MRSA infection nor to guide or monitor treatment for MRSA infections. Performed at [facility], [address] [place], [place] [address]   C difficile quick scan w PCR reflex     Status: Abnormal   Collection Time: 12/21/18  3:26 PM   Specimen: STOOL  Result Value Ref Range Status   C Diff antigen POSITIVE (A) NEGATIVE Final   C Diff toxin NEGATIVE NEGATIVE Final   C Diff interpretation Results are indeterminate. See PCR results.  Final    Comment: Performed at [facility], [place] [address] [place], [place] [address]  C. Diff by PCR, Reflexed     Status: None   Collection Time: 12/21/18  3:26 PM  Result Value Ref Range Status   Toxigenic C. Difficile by PCR NEGATIVE NEGATIVE Final    Comment: Patient is colonized with non toxigenic C. difficile. May not need treatment unless significant symptoms are present. Performed at [facility], [place] [address], [place], [place] [address]     Radiology Reports Dg Chest 1 View  Result Date: 11/24/2018 CLINICAL DATA:  Hemoptysis. Ex-smoker. CHF. EXAM: CHEST  1 VIEW COMPARISON:  CT of 11/21/2018. Most recent chest radiograph 11/21/2018. FINDINGS: Midline trachea. Moderate cardiomegaly. Atherosclerosis in the transverse aorta. No pleural effusion or pneumothorax. Apical lordotic positioning. Diffuse peribronchial thickening. No lobar consolidation. Left lung base scarring. IMPRESSION: 1. No acute cardiopulmonary disease. 2. Cardiomegaly with chronic interstitial thickening. 3.  Aortic Atherosclerosis (ICD10-I70.0). Electronically Signed   By: Abigail Miyamoto M.D.   On: 11/24/2018 20:56   Dg Chest 2 View  Result Date: 12/19/2018 CLINICAL DATA:  Hemoptysis, cough EXAM: CHEST - 2 VIEW COMPARISON:  11/24/2018 FINDINGS: Increased  markings in the lower lungs, likely scarring, similar to prior study. Heart is borderline in size. Aortic atherosclerosis. No acute airspace opacities or effusions. No acute bony abnormality. IMPRESSION: Bibasilar linear densities, likely scarring. No definite acute process. Electronically Signed   By: Rolm Baptise M.D.   On: 12/19/2018 20:59   Ct Head Wo Contrast  Result Date: 12/19/2018 CLINICAL DATA:  Headache, on Eliquis EXAM: CT HEAD WITHOUT CONTRAST TECHNIQUE: Contiguous axial images were obtained from the base of the skull  through the vertex without intravenous contrast. COMPARISON:  09/06/2018 FINDINGS: Brain: Patchy chronic small vessel disease throughout the deep white matter. No acute intracranial abnormality. Specifically, no hemorrhage, hydrocephalus, mass lesion, acute infarction, or significant intracranial injury. Vascular: No hyperdense vessel or unexpected calcification. Skull: No acute calvarial abnormality. Sinuses/Orbits: Visualized paranasal sinuses and mastoids clear. Orbital soft tissues unremarkable. 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 No acute intracranial abnormality. Electronically Signed   By: Rolm Baptise M.D.   On: 12/19/2018 21:00   Ct Angio Chest Pe W And/or Wo Contrast  Result Date: 12/20/2018 CLINICAL DATA:  Nose bleed.  Hemoptysis. EXAM: CT ANGIOGRAPHY CHEST WITH CONTRAST TECHNIQUE: Multidetector CT imaging of the chest was performed using the standard protocol during bolus administration of intravenous contrast. Multiplanar CT image reconstructions and MIPs were obtained to evaluate the vascular anatomy. CONTRAST:  43m OMNIPAQUE IOHEXOL 350 MG/ML SOLN COMPARISON:  11/21/2018 FINDINGS: Cardiovascular: Contrast injection is sufficient to demonstrate satisfactory opacification of the pulmonary arteries to the segmental level.Again identified are small subsegmental pulmonary emboli involving the left lower lobe,  slightly improved from prior study. The main pulmonary artery is dilated measuring approximately 3.3 cm. Aortic calcifications are noted. The heart size is stable. Coronary artery calcifications are noted. Mediastinum/Nodes: --No mediastinal or hilar lymphadenopathy. --No axillary lymphadenopathy. --No supraclavicular lymphadenopathy. --Normal thyroid gland. --The esophagus is unremarkable Lungs/Pleura: There are worsening bibasilar airspace opacities. There is no pneumothorax. There are trace bilateral pleural effusions, right greater than left. The trachea is unremarkable. Upper Abdomen: No acute abnormality. Musculoskeletal: No chest wall abnormality. No acute or significant osseous findings. Review of the MIP images confirms the above findings. IMPRESSION: 1. Improving subsegmental pulmonary emboli involving the left lower lobe. 2. Worsening bibasilar airspace opacities which may represent atelectasis or infiltrates. 3. Trace bilateral pleural effusions. 4. Dilated main pulmonary artery which can be seen in patients with elevated pulmonary artery pressures. Aortic Atherosclerosis (ICD10-I70.0). Electronically Signed   By: CConstance HolsterM.D.   On: 12/20/2018 01:09

## 2018-12-22 NOTE — Progress Notes (Signed)
ANTICOAGULATION CONSULT NOTE - Follow Up Consult  Pharmacy Consult for Heparin IV Indication: pulmonary embolus  Allergies  Allergen Reactions  . Bee Venom Swelling  . Enalapril Maleate Swelling    Patient Measurements: Height: 5\' 3"  (160 cm) IBW/kg (Calculated) : 52.4 Heparin Dosing Weight: 76.6 kg  Vital Signs: Temp: 97.5 F (36.4 C) (11/07 2315) Temp Source: Oral (11/07 2315) BP: 112/70 (11/07 2315) Pulse Rate: 94 (11/07 2315)  Labs: Recent Labs    12/19/18 2125  12/20/18 0947 12/21/18 0721 12/21/18 1517 12/21/18 2220  HGB 8.0*  --  7.9* 8.0*  --   --   HCT 25.4*  --  25.4* 25.4*  --   --   PLT 235  --  265 284  --   --   APTT  --    < > 39* >160* 98* 185*  LABPROT 17.3*  --  15.9*  --   --   --   INR 1.4*  --  1.3*  --   --   --   HEPARINUNFRC  --   --   --  2.18* 1.32*  --   CREATININE 1.45*  --   --  1.05*  --   --    < > = values in this interval not displayed.    Estimated Creatinine Clearance: 51.3 mL/min (A) (by C-G formula based on SCr of 1.05 mg/dL (H)).   Assessment: 45 yoF admitted on 11/7 with COVID-19 pneumonia, hemoptysis, epistaxis.  PMH is significant for recent PE in October, 2020 on apixaban prior to admission.  Pharmacy is consulted to dose IV Heparin inpatient. Last dose of apixaban 5mg  PO BID taken on 11/4 AM per facility [facility].  She was started on heparin IV at [facility], initially started at 1200 units/hr with aPTT > 160, and HL 2.18.  Heparin was reduced to 1000 units/hr and continued on transfer to [facility].  Ordered APTT, and Heparin level (may be falsely elevated d/t apixaban, but is now > 72 hours after last dose.) CBC: Hgb remains low/stable at 8, Plt wnl. RN at [facility] reports no bleeding or complications on arrival from [facility] to [facility].  12/22/2018 0220 UPDATE: APTT=  185 (drawn peripherally)  No bleeding complications or infusion-related problems reported per discussion with RN.   Goal of Therapy:  APTT 66-102 seconds Heparin level  0.3-0.7 units/ml Monitor platelets by anticoagulation protocol: Yes   Plan:  Hold heparin infusion for one hour Reduce heparin infusion to 800 units/hr at 0315 Obtain 6-8 hour Heparin level and aPTT    Once aPTT and heparin levels correlate, will titrate heparin using heparin levels only Daily  CBC Continue to monitor H&H and platelets  Despina Pole, Pharm. D. Clinical Pharmacist 12/22/2018 2:26 AM

## 2018-12-22 NOTE — Progress Notes (Signed)
Attempted to call husband but no answer and mail box full.  Earleen Reaper RN

## 2018-12-22 NOTE — Progress Notes (Signed)
Patient in bed resting. No s/s of pain or distress. All medication given well tolerated. Attempted to call husband, but no answer and voicemail box full. Will continue to monitor for remainder of shift.

## 2018-12-23 LAB — CBC WITH DIFFERENTIAL/PLATELET
Abs Immature Granulocytes: 0.05 10*3/uL (ref 0.00–0.07)
Basophils Absolute: 0 10*3/uL (ref 0.0–0.1)
Basophils Relative: 0 %
Eosinophils Absolute: 0 10*3/uL (ref 0.0–0.5)
Eosinophils Relative: 0 %
HCT: 26.7 % — ABNORMAL LOW (ref 36.0–46.0)
Hemoglobin: 8.5 g/dL — ABNORMAL LOW (ref 12.0–15.0)
Immature Granulocytes: 2 %
Lymphocytes Relative: 25 %
Lymphs Abs: 0.6 10*3/uL — ABNORMAL LOW (ref 0.7–4.0)
MCH: 26.9 pg (ref 26.0–34.0)
MCHC: 31.8 g/dL (ref 30.0–36.0)
MCV: 84.5 fL (ref 80.0–100.0)
Monocytes Absolute: 0.1 10*3/uL (ref 0.1–1.0)
Monocytes Relative: 5 %
Neutro Abs: 1.6 10*3/uL — ABNORMAL LOW (ref 1.7–7.7)
Neutrophils Relative %: 68 %
Platelets: 322 10*3/uL (ref 150–400)
RBC: 3.16 MIL/uL — ABNORMAL LOW (ref 3.87–5.11)
RDW: 19.8 % — ABNORMAL HIGH (ref 11.5–15.5)
WBC: 2.3 10*3/uL — ABNORMAL LOW (ref 4.0–10.5)
nRBC: 0.9 % — ABNORMAL HIGH (ref 0.0–0.2)

## 2018-12-23 LAB — C-REACTIVE PROTEIN: CRP: 3.8 mg/dL — ABNORMAL HIGH (ref <1.0)

## 2018-12-23 LAB — COMPREHENSIVE METABOLIC PANEL
ALT: 28 U/L (ref 0–44)
AST: 43 U/L — ABNORMAL HIGH (ref 15–41)
Albumin: 2.2 g/dL — ABNORMAL LOW (ref 3.5–5.0)
Alkaline Phosphatase: 205 U/L — ABNORMAL HIGH (ref 38–126)
Anion gap: 8 (ref 5–15)
BUN: 26 mg/dL — ABNORMAL HIGH (ref 8–23)
CO2: 22 mmol/L (ref 22–32)
Calcium: 8.2 mg/dL — ABNORMAL LOW (ref 8.9–10.3)
Chloride: 104 mmol/L (ref 98–111)
Creatinine, Ser: 1.55 mg/dL — ABNORMAL HIGH (ref 0.44–1.00)
GFR calc Af Amer: 37 mL/min — ABNORMAL LOW (ref >60)
GFR calc non Af Amer: 32 mL/min — ABNORMAL LOW (ref >60)
Glucose, Bld: 124 mg/dL — ABNORMAL HIGH (ref 70–99)
Potassium: 4.2 mmol/L (ref 3.5–5.1)
Sodium: 134 mmol/L — ABNORMAL LOW (ref 135–145)
Total Bilirubin: 0.4 mg/dL (ref 0.3–1.2)
Total Protein: 6.8 g/dL (ref 6.5–8.1)

## 2018-12-23 LAB — GLUCOSE, CAPILLARY
Glucose-Capillary: 104 mg/dL — ABNORMAL HIGH (ref 70–99)
Glucose-Capillary: 94 mg/dL (ref 70–99)
Glucose-Capillary: 108 mg/dL — ABNORMAL HIGH (ref 70–99)
Glucose-Capillary: 144 mg/dL — ABNORMAL HIGH (ref 70–99)

## 2018-12-23 LAB — BRAIN NATRIURETIC PEPTIDE: B Natriuretic Peptide: 129.4 pg/mL — ABNORMAL HIGH (ref 0.0–100.0)

## 2018-12-23 LAB — MAGNESIUM: Magnesium: 1.8 mg/dL (ref 1.7–2.4)

## 2018-12-23 LAB — D-DIMER, QUANTITATIVE: D-Dimer, Quant: 7.75 ug/mL-FEU — ABNORMAL HIGH (ref 0.00–0.50)

## 2018-12-23 NOTE — Evaluation (Addendum)
Occupational Therapy Evaluation Patient Details Name: Katherine [name] MRN: [id number] DOB: [birth year] Today's Date: 12/23/2018    History of Present Illness 78 y.o. female, with history of recent PE diagnosed November 21, 2018 currently on Eliquis, generalized weakness and deconditioning, virtually bedbound and nursing home resident for several months, CKD 3, DM type II, hypertension, COPD, GERD, seizures, obesity, CVA who lives in a nursing home and initially presented to [facility] ER with chief complaints of nosebleeding. Subsequently testing suggested that she had COVID-19 infection and sent to [facility] for further care of pneumonitis.   Clinical Impression   This 78 y/o female presents with the above. Pt residing in SNF PTA, reports at baseline mostly bedbound, stating she typically does not get OOB to w/c. Pt requiring assist for bathing/dressing ADL from nursing staff at bed level, reports she is able to self feed given increased time/effort. Pt requiring minA for bed mobility this session rolling to L/R. Pt requiring totalA for toileting ADL, up to [place] for UB ADL. VSS throughout on RA. Suspect pt is likely close to or at her baseline with ADL/mobility status. Recommend pt return to SNF at time of discharge for further OT needs PRN. No further acute OT needs identified. Will sign off, thank you for this referral.    Follow Up Recommendations  SNF    Equipment Recommendations  Other (comment)(defer to next venue)           Precautions / Restrictions Precautions Precautions: Fall Restrictions Weight Bearing Restrictions: No      Mobility Bed Mobility Overal bed mobility: Needs Assistance Bed Mobility: Rolling Rolling: Min assist         General bed mobility comments: to pt's rt with supervision and rail; to her left with min assist due to RUE weakness  Transfers                 General transfer comment: NA bedbound at baseline    Balance                                            ADL either performed or assessed with clinical judgement   ADL Overall ADL's : Needs assistance/impaired Eating/Feeding: Set up;Minimal assistance;Bed level   Grooming: Minimal assistance;Set up;Bed level   Upper Body Bathing: Minimal assistance;Bed level   Lower Body Bathing: Maximal assistance;Total assistance;Bed level   Upper Body Dressing : Moderate assistance;Bed level   Lower Body Dressing: Maximal assistance;Total assistance;Bed level       Toileting- Clothing Manipulation and Hygiene: Total assistance;+2 for safety/equipment;Bed level Toileting - Clothing Manipulation Details (indicate cue type and reason): using bed pan at bed level, pt able to communicate needs (needing bedpan), totalA for pericare after BM       General ADL Comments: suspect pt likely close to her baseline     Vision         Perception     Praxis      Pertinent Vitals/Pain Pain Assessment: No/denies pain     Hand Dominance Right   Extremity/Trunk Assessment Upper Extremity Assessment Upper Extremity Assessment: Generalized weakness;RUE deficits/detail RUE Deficits / Details: increased weakness in RUE vs LUE due to previous CVA, tremors noted  RUE Coordination: decreased fine motor;decreased gross motor   Lower Extremity Assessment Lower Extremity Assessment: Defer to PT evaluation   Cervical / Trunk Assessment Cervical / Trunk Assessment: Other exceptions Cervical /  Trunk Exceptions: obese   Communication Communication Communication: No difficulties   Cognition Arousal/Alertness: Awake/alert Behavior During Therapy: WFL for tasks assessed/performed Overall Cognitive Status: Within Functional Limits for tasks assessed                                     General Comments  Patient reports she has not qualified for therapy in a long time. Patient able to state she needed to use the bedpan; assisted on/off and pt +urine/BM     Exercises     Shoulder Instructions      Home Living Family/patient expects to be discharged to:: Other (Comment)(long-term care)                                 Additional Comments: Patient reports that she has been bed bound for a year, does not sit at the edge of bed, roll over , stand or walk or sit in a wc (reports her insurance won't pay for a w/c)      Prior Functioning/Environment Level of Independence: Needs assistance  Gait / Transfers Assistance Needed: bedbound x 1 yr; is able to assist with bed mobility ADL's / Homemaking Assistance Needed: total assist for bathing; can feed herself "but it takes a long time because of the stroke on my right side"            OT Problem List: Decreased strength;Decreased range of motion;Decreased activity tolerance;Impaired balance (sitting and/or standing);Cardiopulmonary status limiting activity;Obesity;Impaired UE functional use      OT Treatment/Interventions:      OT Goals(Current goals can be found in the care plan section) Acute Rehab OT Goals Patient Stated Goal: none stated, agreeable to working with therapies OT Goal Formulation: All assessment and education complete, DC therapy  OT Frequency:     Barriers to D/C:            Co-evaluation              AM-PAC OT "6 Clicks" Daily Activity     Outcome Measure Help from another person eating meals?: A Little Help from another person taking care of personal grooming?: A Little Help from another person toileting, which includes using toliet, bedpan, or urinal?: Total Help from another person bathing (including washing, rinsing, drying)?: A Lot Help from another person to put on and taking off regular upper body clothing?: A Lot Help from another person to put on and taking off regular lower body clothing?: Total 6 Click Score: 12   End of Session Nurse Communication: Mobility status  Activity Tolerance: Patient tolerated treatment well Patient  left: in bed;with call bell/phone within reach  OT Visit Diagnosis: Muscle weakness (generalized) (M62.81)                Time: [id number] OT Time Calculation (min): 25 min Charges:  OT General Charges $OT Visit: 1 Visit OT Evaluation $OT Eval Moderate Complexity: 1 Mod  Lou Cal, [place] Pager [phone] Office [phone]   Raymondo Band 12/23/2018, 2:11 PM

## 2018-12-23 NOTE — Progress Notes (Signed)
PROGRESS NOTE                                                                                                                                                                                                             Patient Demographics:    Katherine [name], is a 78 y.o. female, DOB - 02/05/1941, [id number]  Outpatient Primary MD for the patient is Valerie Roys, DO    LOS - 2  Admit date - 12/21/2018    Chief complaint.  Nosebleed.     Brief Narrative -  Katherine [name]  is a 78 y.o. female, with history of recent PE diagnosed November 21, 2018 currently on Eliquis, generalized weakness and deconditioning, virtually bedbound and nursing home resident for several months, CKD 3, DM type II, hypertension, COPD, GERD, seizures, obesity, CVA who lives in a nursing home and initially presented to [facility] ER with chief complaints of nosebleeding.  She was found to have epistaxis and it was treated.  Subsequently testing suggested that she had COVID-19 infection and sent to [facility] for further care.  Patient is currently relatively symptom-free.  Denies any headache, no chest pain shortness of breath.  No productive cough.  No focal weakness which is new.   Subjective:   Patient in bed, appears comfortable, denies any headache, no fever, no chest pain or pressure, no shortness of breath , no abdominal pain. No focal weakness, improved diarrhea.   Assessment  & Plan :     1.  Acute Covid 19 Viral Pneumonitis during the ongoing 2020 Covid 19 Pandemic -mild disease currently on 1 L nasal cannula oxygen and can be titrated down to room air soon.  Continue IV steroids and remdesivir.  Monitor clinically.  Encouraged to sit up in chair and daytime use I-S and flutter valve for pulmonary toiletry and prone in bed at night if possible.   COVID-19 Labs  Recent Labs    12/20/18 1125 12/20/18 1925 12/21/18 0721 12/22/18 0940  12/23/18 0410  DDIMER  --   --   --  8.32* 7.75*  FERRITIN  --  63 94  --   --  LDH 148  --   --   --   --   CRP  --   --  8.9* 5.3* 3.8*    Lab Results  Component Value Date   SARSCOV2NAA POSITIVE (A) 12/19/2018   SARSCOV2NAA NOT DETECTED 11/24/2018   Hooks NEGATIVE 11/21/2018   Eddystone NEGATIVE 09/06/2018     SpO2: 96 % O2 Flow Rate (L/min): 1 L/min  Hepatic Function Latest Ref Rng & Units 12/23/2018 12/22/2018 12/21/2018  Total Protein 6.5 - 8.1 g/dL 6.8 6.9 6.8  Albumin 3.5 - 5.0 g/dL 2.2(L) 2.1(L) 2.0(L)  AST 15 - 41 U/L 43(H) 58(H) 70(H)  ALT 0 - 44 U/L _0 Alk Phosphatase 38 - 126 U/L 205(H) 227(H) 242(H)  Total Bilirubin 0.3 - 1.2 mg/dL 0.4 0.5 0.7        Component Value Date/Time   BNP 129.4 (H) 12/23/2018 0410    2.  Recent PE diagnosed October 2020.  Due to recent nosebleed Eliquis held will continue heparin drip for now.  Nosebleed seems to have resolved.  3.  History of ESBL UTI.  She was started on Invanz on 12/19/2018.  Currently no signs of active infection, will rule out C. difficile as an #8 below thereafter if signs of any active infection will add antibiotics appropriately.  4.  Leukopenia.  Likely due to viral illness.  5.  History of CVA.  Continue statin for secondary prevention along with anticoagulation.  6.  Weakness, deconditioning, bedbound status.  Supportive care discharge back to SNF once better.  7.  GERD.  Switch PPI to Pepcid, rule out C. difficile first.  8.  Gastroenteritis.  Likely Covid related likely C. difficile colonized, PCR negative.  Diarrhea improved will monitor.  9.  CKD 2.  Baseline creatinine 1.2 will monitor.   10. DM type II.  Continue Tradjenta, placed on sliding scale here.    Lab Results  Component Value Date   HGBA1C 5.1 12/21/2018   CBG (last 3)  Recent Labs    12/22/18 1639 12/22/18 2140 12/23/18 0805  GLUCAP 61 122* 104*     Condition - Fair  Family Communication  : Left  for husband on 12/22/2018 at 10:52 AM  Code Status :  Full  Diet :   Diet Order            Diet Carb Modified Fluid consistency: Thin; Room service appropriate? Yes  Diet effective now               Disposition Plan  :  SNF, 12/25/2018 after she finishes her IV remdesivir.  Consults  :  None  Procedures  :     PUD Prophylaxis : pepcid  DVT Prophylaxis  :    Heparin gtt >> Eliquis  Lab Results  Component Value Date   PLT 322 12/23/2018    Inpatient Medications  Scheduled Meds:  apixaban  5 mg Oral BID   atorvastatin  80 mg Oral QPM   cholecalciferol  1,000 Units Oral Daily   dexamethasone (DECADRON) injection  6 mg Intravenous Q24H   docusate sodium  100 mg Oral BID   famotidine  40 mg Oral Daily   gabapentin  300 mg Oral QHS   hydrALAZINE  25 mg Oral TID   insulin aspart  0-5 Units Subcutaneous QHS   insulin aspart  0-9 Units Subcutaneous TID WC   levETIRAcetam  500 mg Oral Daily   linagliptin  5 mg Oral Daily   multivitamin  with minerals  1 tablet Oral Daily   cyanocobalamin  1,000 mcg Oral Daily   Continuous Infusions:  remdesivir 100 mg in NS 250 mL 100 mg (12/22/18 1624)   PRN Meds:.acetaminophen, nitroGLYCERIN, ondansetron **OR** ondansetron (ZOFRAN) IV  Antibiotics  :    Anti-infectives (From admission, onward)   Start     Dose/Rate Route Frequency Ordered Stop   12/21/18 1600  remdesivir 100 mg in sodium chloride 0.9 % 250 mL IVPB     100 mg 500 mL/hr over 30 Minutes Intravenous Every 24 hours 12/21/18 1454 12/25/18 1559       Time Spent in minutes  30   Lala Lund M.D on 12/23/2018 at 9:18 AM  To page go to [email or web address] - password [facility]  [facility] -  Office  [phone]    See all Orders from today for further details    Objective:   Vitals:   12/23/18 0500 12/23/18 0600 12/23/18 0630 12/23/18 0806  BP:   (!) 132/91 (!) 133/96  Pulse: 88 80 97 91  Resp: 19 18 (!) 24 (!) 21  Temp:   97.8 F (36.6 C)  97.7 F (36.5 C)  TempSrc:   Oral Oral  SpO2: 93% 93% 95% 96%  Height:        Wt Readings from Last 3 Encounters:  12/19/18 102.5 kg  11/21/18 104 kg  11/01/18 104.1 kg     Intake/Output Summary (Last 24 hours) at 12/23/2018 0918 Last data filed at 12/23/2018 0600 Gross per 24 hour  Intake 850 ml  Output 675 ml  Net 175 ml     Physical Exam  Awake Alert, No new F.N deficits, Normal affect [place].AT,PERRAL Supple Neck,No JVD, No cervical lymphadenopathy appriciated.  Symmetrical Chest wall movement, Good air movement bilaterally, CTAB RRR,No Gallops, Rubs or new Murmurs, No Parasternal Heave +ve B.Sounds, Abd Soft, No tenderness, No organomegaly appriciated, No rebound - guarding or rigidity. No Cyanosis, Clubbing or edema, No new Rash or bruise    Data Review:    CBC Recent Labs  Lab 12/19/18 2125 12/20/18 0947 12/21/18 0721 12/22/18 0940 12/23/18 0410  WBC 2.9* 2.9* 1.5* 2.6* 2.3*  HGB 8.0* 7.9* 8.0* 8.4* 8.5*  HCT 25.4* 25.4* 25.4* 26.6* 26.7*  PLT 235 265 284 320 322  MCV 83.3 84.1 84.4 84.7 84.5  MCH 26.2 26.2 26.6 26.8 26.9  MCHC 31.5 31.1 31.5 31.6 31.8  RDW 19.3* 19.3* 19.4* 19.7* 19.8*  LYMPHSABS 0.9  --  0.5* 0.8 0.6*  MONOABS 0.3  --  0.1 0.2 0.1  EOSABS 0.1  --  0.0 0.0 0.0  BASOSABS 0.0  --  0.0 0.0 0.0    Chemistries  Recent Labs  Lab 12/19/18 2125 12/21/18 0721 12/22/18 0940 12/23/18 0410  NA 136 134* 133* 134*  K 4.1 4.3 4.1 4.2  CL 105 103 103 104  CO2 _0 GLUCOSE 97 131* 114* 124*  BUN _1 26*  CREATININE 1.45* 1.05* 1.42* 1.55*  CALCIUM 8.6* 8.5* 8.3* 8.2*  MG  --  1.8 1.6* 1.8  AST 89* 70* 58* 43*  ALT 33 _2 ALKPHOS 257* 242* 227* 205*  BILITOT 0.8 0.7 0.5 0.4   ------------------------------------------------------------------------------------------------------------------ No results for input(s): CHOL, HDL, LDLCALC, TRIG, CHOLHDL, LDLDIRECT in the last 72 hours.  Lab Results  Component Value Date    HGBA1C 5.1 12/21/2018   ------------------------------------------------------------------------------------------------------------------ No results for input(s): TSH, T4TOTAL, T3FREE, THYROIDAB in the last 72 hours.  Invalid input(s): FREET3  Cardiac Enzymes No results for input(s): CKMB, TROPONINI, MYOGLOBIN in the last 168 hours.  Invalid input(s): CK ------------------------------------------------------------------------------------------------------------------    Component Value Date/Time   BNP 129.4 (H) 12/23/2018 0410    Micro Results Recent Results (from the past 240 hour(s))  SARS CORONAVIRUS 2 (TAT 6-24 HRS) Nasopharyngeal Nasopharyngeal Swab     Status: Abnormal   Collection Time: 12/19/18 11:24 PM   Specimen: Nasopharyngeal Swab  Result Value Ref Range Status   SARS Coronavirus 2 POSITIVE (A) NEGATIVE Final    Comment: RESULT CALLED TO, READ BACK BY AND VERIFIED WITH: Henderson Newcomer RN 15:20 12/20/18 ([email or web address]) (NOTE) SARS-CoV-2 target nucleic acids are DETECTED. The SARS-CoV-2 RNA is generally detectable in upper and lower respiratory specimens during the acute phase of infection. Positive results are indicative of active infection with SARS-CoV-2. Clinical  correlation with patient history and other diagnostic information is necessary to determine patient infection status. Positive results do  not rule out bacterial infection or co-infection with other viruses. The expected result is Negative. Fact Sheet for Patients: SugarRoll.be Fact Sheet for Healthcare Providers: https://www.woods-mathews.com/ This test is not yet approved or cleared by the [place] FDA and  has been authorized for detection and/or diagnosis of SARS-CoV-2 by FDA under an Emergency Use Authorization (EUA). This EUA will remain  in effect (meaning this test can be used) for  the duration of the COVID-19 declaration under Section 564(b)(1) of the  Act, 21 U.S.C. section 360bbb-3(b)(1), unless the authorization is terminated or revoked sooner. Performed at [facility], [place] [address] [place], [place] [address]   MRSA PCR Screening     Status: None   Collection Time: 12/21/18  8:15 AM   Specimen: Nasal Mucosa; Nasopharyngeal  Result Value Ref Range Status   MRSA by PCR NEGATIVE NEGATIVE Final    Comment:        The GeneXpert MRSA Assay (FDA approved for NASAL specimens only), is one component of a comprehensive MRSA colonization surveillance program. It is not intended to diagnose MRSA infection nor to guide or monitor treatment for MRSA infections. Performed at [facility], [place] [place], [place] [address]   C difficile quick scan w PCR reflex     Status: Abnormal   Collection Time: 12/21/18  3:26 PM   Specimen: STOOL  Result Value Ref Range Status   C Diff antigen POSITIVE (A) NEGATIVE Final   C Diff toxin NEGATIVE NEGATIVE Final   C Diff interpretation Results are indeterminate. See PCR results.  Final    Comment: Performed at [facility], [place] [address] [place], [place] [address]  C. Diff by PCR, Reflexed     Status: None   Collection Time: 12/21/18  3:26 PM  Result Value Ref Range Status   Toxigenic C. Difficile by PCR NEGATIVE NEGATIVE Final    Comment: Patient is colonized with non toxigenic C. difficile. May not need treatment unless significant symptoms are present. Performed at [facility], [place] [address], [place], [place] [address]     Radiology Reports Dg Chest 1 View  Result Date: 11/24/2018 CLINICAL DATA:  Hemoptysis. Ex-smoker. CHF. EXAM: CHEST  1 VIEW COMPARISON:  CT of 11/21/2018. Most recent chest radiograph 11/21/2018. FINDINGS: Midline trachea. Moderate cardiomegaly. Atherosclerosis in the transverse aorta. No pleural effusion or pneumothorax. Apical lordotic positioning. Diffuse peribronchial thickening. No lobar consolidation. Left lung  base scarring. IMPRESSION: 1. No acute cardiopulmonary disease. 2. Cardiomegaly with chronic interstitial  thickening. 3.  Aortic Atherosclerosis (ICD10-I70.0). Electronically Signed   By: Abigail Miyamoto M.D.   On: 11/24/2018 20:56   Dg Chest 2 View  Result Date: 12/19/2018 CLINICAL DATA:  Hemoptysis, cough EXAM: CHEST - 2 VIEW COMPARISON:  11/24/2018 FINDINGS: Increased markings in the lower lungs, likely scarring, similar to prior study. Heart is borderline in size. Aortic atherosclerosis. No acute airspace opacities or effusions. No acute bony abnormality. IMPRESSION: Bibasilar linear densities, likely scarring. No definite acute process. Electronically Signed   By: Rolm Baptise M.D.   On: 12/19/2018 20:59   Ct Head Wo Contrast  Result Date: 12/19/2018 CLINICAL DATA:  Headache, on Eliquis EXAM: CT HEAD WITHOUT CONTRAST TECHNIQUE: Contiguous axial images were obtained from the base of the skull through the vertex without intravenous contrast. COMPARISON:  09/06/2018 FINDINGS: Brain: Patchy chronic small vessel disease throughout the deep white matter. No acute intracranial abnormality. Specifically, no hemorrhage, hydrocephalus, mass lesion, acute infarction, or significant intracranial injury. Vascular: No hyperdense vessel or unexpected calcification. Skull: No acute calvarial abnormality. Sinuses/Orbits: Visualized paranasal sinuses and mastoids clear. Orbital soft tissues unremarkable. 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 No acute intracranial abnormality. Electronically Signed   By: Rolm Baptise M.D.   On: 12/19/2018 21:00   Ct Angio Chest Pe W And/or Wo Contrast  Result Date: 12/20/2018 CLINICAL DATA:  Nose bleed.  Hemoptysis. EXAM: CT ANGIOGRAPHY CHEST WITH CONTRAST TECHNIQUE: Multidetector CT imaging of the chest was performed using the standard protocol during bolus administration of intravenous contrast. Multiplanar CT image  reconstructions and MIPs were obtained to evaluate the vascular anatomy. CONTRAST:  50m OMNIPAQUE IOHEXOL 350 MG/ML SOLN COMPARISON:  11/21/2018 FINDINGS: Cardiovascular: Contrast injection is sufficient to demonstrate satisfactory opacification of the pulmonary arteries to the segmental level.Again identified are small subsegmental pulmonary emboli involving the left lower lobe, slightly improved from prior study. The main pulmonary artery is dilated measuring approximately 3.3 cm. Aortic calcifications are noted. The heart size is stable. Coronary artery calcifications are noted. Mediastinum/Nodes: --No mediastinal or hilar lymphadenopathy. --No axillary lymphadenopathy. --No supraclavicular lymphadenopathy. --Normal thyroid gland. --The esophagus is unremarkable Lungs/Pleura: There are worsening bibasilar airspace opacities. There is no pneumothorax. There are trace bilateral pleural effusions, right greater than left. The trachea is unremarkable. Upper Abdomen: No acute abnormality. Musculoskeletal: No chest wall abnormality. No acute or significant osseous findings. Review of the MIP images confirms the above findings. IMPRESSION: 1. Improving subsegmental pulmonary emboli involving the left lower lobe. 2. Worsening bibasilar airspace opacities which may represent atelectasis or infiltrates. 3. Trace bilateral pleural effusions. 4. Dilated main pulmonary artery which can be seen in patients with elevated pulmonary artery pressures. Aortic Atherosclerosis (ICD10-I70.0). Electronically Signed   By: CConstance HolsterM.D.   On: 12/20/2018 01:09

## 2018-12-23 NOTE — Evaluation (Signed)
Physical Therapy Evaluation and Discharge  Patient Details Name: Katherine [name] MRN: [id number] DOB: Feb 11, 1941 Today's Date: 12/23/2018   History of Present Illness  78 y.o. female, with history of recent PE diagnosed November 21, 2018 currently on Eliquis, generalized weakness and deconditioning, virtually bedbound and nursing home resident for several months, CKD 3, DM type II, hypertension, COPD, GERD, seizures, obesity, CVA who lives in a nursing home and initially presented to [facility] ER with chief complaints of nosebleeding. Subsequently testing suggested that she had COVID-19 infection and sent to [facility] for further care of pneumonitis.  Clinical Impression   Patient evaluated by Physical Therapy with no further PT needs identified. Patient has been a long-term resident and not receiving therapies "for some time" (per pt). She has been bedbound for 1 year.  PT is signing off. Thank you for this referral.     Follow Up Recommendations No PT follow up    Equipment Recommendations  None recommended by PT    Recommendations for Other Services       Precautions / Restrictions Precautions Precautions: Fall      Mobility  Bed Mobility Overal bed mobility: Needs Assistance Bed Mobility: Rolling Rolling: Min assist         General bed mobility comments: to pt's rt with supervision and rail; to her left with min assist due to RUE weakness  Transfers                 General transfer comment: NA bedbound at baseline  Ambulation/Gait             General Gait Details: NA bedbound at baseline  Stairs            Wheelchair Mobility    Modified Rankin (Stroke Patients Only)       Balance                                             Pertinent Vitals/Pain Pain Assessment: No/denies pain    Home Living Family/patient expects to be discharged to:: Other (Comment)(long-term care)                 Additional Comments: Patient  reports that she has been bed bound for a year, does not sit at the edge of bed, roll over , stand or walk or sit in a wc (reports her insurance won't pay for a w/c)    Prior Function Level of Independence: Needs assistance   Gait / Transfers Assistance Needed: bedbound x 1 yr; is able to assist with bed mobility  ADL's / Homemaking Assistance Needed: total assist for bathing; can feed herself "but it takes a long time because of the stroke on my right side"        Hand Dominance   Dominant Hand: Right    Extremity/Trunk Assessment   Upper Extremity Assessment Upper Extremity Assessment: Defer to OT evaluation    Lower Extremity Assessment Lower Extremity Assessment: Generalized weakness    Cervical / Trunk Assessment Cervical / Trunk Assessment: Other exceptions Cervical / Trunk Exceptions: obese  Communication   Communication: No difficulties  Cognition Arousal/Alertness: Awake/alert Behavior During Therapy: WFL for tasks assessed/performed Overall Cognitive Status: Within Functional Limits for tasks assessed  General Comments General comments (skin integrity, edema, etc.): Patient reports she has not qualified for therapy in a long time. Patient able to state she needed to use the bedpan; assisted on/off and pt +urine/BM    Exercises     Assessment/Plan    PT Assessment Patent does not need any further PT services  PT Problem List         PT Treatment Interventions      PT Goals (Current goals can be found in the Care Plan section)  Acute Rehab PT Goals PT Goal Formulation: All assessment and education complete, DC therapy    Frequency     Barriers to discharge        Co-evaluation               AM-PAC PT "6 Clicks" Mobility  Outcome Measure Help needed turning from your back to your side while in a flat bed without using bedrails?: A Little Help needed moving from lying on your back to  sitting on the side of a flat bed without using bedrails?: A Lot Help needed moving to and from a bed to a chair (including a wheelchair)?: Total Help needed standing up from a chair using your arms (e.g., wheelchair or bedside chair)?: Total Help needed to walk in hospital room?: Total Help needed climbing 3-5 steps with a railing? : Total 6 Click Score: 9    End of Session   Activity Tolerance: Patient tolerated treatment well Patient left: in bed;with call bell/phone within reach(bed alarm not working)   PT Visit Diagnosis: Muscle weakness (generalized) (M62.81)    Time: [id number] PT Time Calculation (min) (ACUTE ONLY): 26 min   Charges:   PT Evaluation $PT Eval Low Complexity: 1 Low           Barry Brunner, PT    Rexanne Mano 12/23/2018, 12:46 PM

## 2018-12-24 ENCOUNTER — Inpatient Hospital Stay (HOSPITAL_COMMUNITY)
Admission: AD | Admit: 2018-12-24 | Discharge: 2018-12-24 | Disposition: A | Discharge status: Home / Self Care | Payer: Medicare HMO | Source: Other Acute Inpatient Hospital | Attending: Neurology | Admitting: Neurology

## 2018-12-24 ENCOUNTER — Inpatient Hospital Stay (HOSPITAL_COMMUNITY): Payer: Medicare HMO

## 2018-12-24 DIAGNOSIS — R4182 Altered mental status, unspecified: Secondary | ICD-10-CM

## 2018-12-24 LAB — COMPREHENSIVE METABOLIC PANEL
ALT: 25 U/L (ref 0–44)
AST: 35 U/L (ref 15–41)
Albumin: 2.3 g/dL — ABNORMAL LOW (ref 3.5–5.0)
Alkaline Phosphatase: 192 U/L — ABNORMAL HIGH (ref 38–126)
Anion gap: 8 (ref 5–15)
BUN: 30 mg/dL — ABNORMAL HIGH (ref 8–23)
CO2: 22 mmol/L (ref 22–32)
Calcium: 8.3 mg/dL — ABNORMAL LOW (ref 8.9–10.3)
Chloride: 106 mmol/L (ref 98–111)
Creatinine, Ser: 1.64 mg/dL — ABNORMAL HIGH (ref 0.44–1.00)
GFR calc Af Amer: 35 mL/min — ABNORMAL LOW (ref >60)
GFR calc non Af Amer: 30 mL/min — ABNORMAL LOW (ref >60)
Glucose, Bld: 117 mg/dL — ABNORMAL HIGH (ref 70–99)
Potassium: 4.3 mmol/L (ref 3.5–5.1)
Sodium: 136 mmol/L (ref 135–145)
Total Bilirubin: 0.7 mg/dL (ref 0.3–1.2)
Total Protein: 6.6 g/dL (ref 6.5–8.1)

## 2018-12-24 LAB — BRAIN NATRIURETIC PEPTIDE: B Natriuretic Peptide: 70.9 pg/mL (ref 0.0–100.0)

## 2018-12-24 LAB — CBC WITH DIFFERENTIAL/PLATELET
Abs Immature Granulocytes: 0.03 10*3/uL (ref 0.00–0.07)
Basophils Absolute: 0 10*3/uL (ref 0.0–0.1)
Basophils Relative: 0 %
Eosinophils Absolute: 0 10*3/uL (ref 0.0–0.5)
Eosinophils Relative: 0 %
HCT: 26.6 % — ABNORMAL LOW (ref 36.0–46.0)
Hemoglobin: 8.3 g/dL — ABNORMAL LOW (ref 12.0–15.0)
Immature Granulocytes: 1 %
Lymphocytes Relative: 27 %
Lymphs Abs: 0.7 10*3/uL (ref 0.7–4.0)
MCH: 26.6 pg (ref 26.0–34.0)
MCHC: 31.2 g/dL (ref 30.0–36.0)
MCV: 85.3 fL (ref 80.0–100.0)
Monocytes Absolute: 0.3 10*3/uL (ref 0.1–1.0)
Monocytes Relative: 10 %
Neutro Abs: 1.7 10*3/uL (ref 1.7–7.7)
Neutrophils Relative %: 62 %
Platelets: 343 10*3/uL (ref 150–400)
RBC: 3.12 MIL/uL — ABNORMAL LOW (ref 3.87–5.11)
RDW: 20 % — ABNORMAL HIGH (ref 11.5–15.5)
WBC: 2.6 10*3/uL — ABNORMAL LOW (ref 4.0–10.5)
nRBC: 1.9 % — ABNORMAL HIGH (ref 0.0–0.2)

## 2018-12-24 LAB — GLUCOSE, CAPILLARY
Glucose-Capillary: 89 mg/dL (ref 70–99)
Glucose-Capillary: 91 mg/dL (ref 70–99)
Glucose-Capillary: 80 mg/dL (ref 70–99)
Glucose-Capillary: 108 mg/dL — ABNORMAL HIGH (ref 70–99)
Glucose-Capillary: 149 mg/dL — ABNORMAL HIGH (ref 70–99)

## 2018-12-24 LAB — POCT I-STAT 7, (LYTES, BLD GAS, ICA,H+H)
Acid-base deficit: 2 mmol/L (ref 0.0–2.0)
Bicarbonate: 22.5 mmol/L (ref 20.0–28.0)
Calcium, Ion: 1.23 mmol/L (ref 1.15–1.40)
HCT: 28 % — ABNORMAL LOW (ref 36.0–46.0)
Hemoglobin: 9.5 g/dL — ABNORMAL LOW (ref 12.0–15.0)
O2 Saturation: 99 %
Potassium: 4.1 mmol/L (ref 3.5–5.1)
Sodium: 136 mmol/L (ref 135–145)
TCO2: 24 mmol/L (ref 22–32)
pCO2 arterial: 37.3 mmHg (ref 32.0–48.0)
pH, Arterial: 7.389 (ref 7.350–7.450)
pO2, Arterial: 131 mmHg — ABNORMAL HIGH (ref 83.0–108.0)

## 2018-12-24 LAB — D-DIMER, QUANTITATIVE: D-Dimer, Quant: 6.71 ug/mL-FEU — ABNORMAL HIGH (ref 0.00–0.50)

## 2018-12-24 LAB — C-REACTIVE PROTEIN: CRP: 2.4 mg/dL — ABNORMAL HIGH (ref <1.0)

## 2018-12-24 LAB — MAGNESIUM: Magnesium: 1.8 mg/dL (ref 1.7–2.4)

## 2018-12-24 MED ORDER — MIDAZOLAM HCL 2 MG/2ML IJ SOLN
INTRAMUSCULAR | Status: AC
  Filled 2018-12-24: qty 4

## 2018-12-24 MED ORDER — ROCURONIUM BROMIDE 10 MG/ML (PF) SYRINGE
PREFILLED_SYRINGE | INTRAVENOUS | Status: AC
  Filled 2018-12-24: qty 10

## 2018-12-24 MED ORDER — DEXAMETHASONE SODIUM PHOSPHATE 4 MG/ML IJ SOLN
2.0000 mg | INTRAMUSCULAR | Status: DC
  Administered 2018-12-24 – 2018-12-26 (×2): 2 mg via INTRAVENOUS
  Filled 2018-12-24: qty 1

## 2018-12-24 MED ORDER — LEVETIRACETAM IN NACL 500 MG/100ML IV SOLN
500.0000 mg | INTRAVENOUS | Status: DC
  Filled 2018-12-24: qty 100

## 2018-12-24 MED ORDER — LEVETIRACETAM IN NACL 500 MG/100ML IV SOLN: 500.0000 mg | Freq: Two times a day (BID) | INTRAVENOUS | Status: DC

## 2018-12-24 MED ORDER — VECURONIUM BROMIDE 10 MG IV SOLR
INTRAVENOUS | Status: AC
  Filled 2018-12-24: qty 10

## 2018-12-24 MED ORDER — LORAZEPAM 2 MG/ML IJ SOLN
1.0000 mg | INTRAMUSCULAR | Status: AC
  Filled 2018-12-24: qty 1

## 2018-12-24 MED ORDER — ETOMIDATE 2 MG/ML IV SOLN
INTRAVENOUS | Status: AC
  Filled 2018-12-24: qty 10

## 2018-12-24 MED ORDER — LEVETIRACETAM IN NACL 1000 MG/100ML IV SOLN
1000.0000 mg | INTRAVENOUS | Status: AC
  Administered 2018-12-24: 10:00:00 1000 mg via INTRAVENOUS
  Filled 2018-12-24: qty 100

## 2018-12-24 MED ORDER — LEVETIRACETAM IN NACL 500 MG/100ML IV SOLN
500.0000 mg | INTRAVENOUS | Status: DC
  Administered 2018-12-25: 500 mg via INTRAVENOUS
  Filled 2018-12-24 (×2): qty 100

## 2018-12-24 MED ORDER — FENTANYL CITRATE (PF) 100 MCG/2ML IJ SOLN
INTRAMUSCULAR | Status: AC
  Filled 2018-12-24: qty 2

## 2018-12-24 MED ORDER — LACTATED RINGERS IV SOLN
INTRAVENOUS | Status: DC
  Administered 2018-12-24: 13:00:00 via INTRAVENOUS

## 2018-12-24 NOTE — Progress Notes (Signed)
Was called for Rapid Response to see Katherine [name] who was not verbal, shallow breathing and groaning.  One eye was gazing to the side.  Sats on 2 LPM 97%.  Pt was rigid.  Transported on 100% NRB to CT Scan and then ICU placed on 15 L HFNC sat 100%. Dr Lynetta Mare at bedside.

## 2018-12-24 NOTE — Progress Notes (Signed)
Patient in bed, verbally expressed having a headache, vital signs stable as charted. Patient abruptly became verbally unresponsive, only responding to painful stimuli. MD present, stated he would put in STAT CT orders. Patient would not arouse to voice and right eyes were deviated to the right. O2 placed on patient and she was transfer to CT via stretcher by RT and ICU nurse.

## 2018-12-24 NOTE — Consult Note (Signed)
   [facility] CM Inpatient Consult   12/24/2018  Katherine [name] Feb 10, 1941 [id number]    Patient screened for47%extreme high risk score for unplanned readmission, with 7 day and 30 day readmissions, 5 hospitalizations in the past 6 months; and to check forpotential [facility] Care Management services neededunder her [facility] plan.  Brief chartreview shows that current disposition plan is toreturn back toSNF (skilled nursing facility).  Per medical record review,patient isa long term [place], bed-bound for a year.   No [facility] care management follow-up needed at this time.   Will sign off.   For questions and additional information, please call:  Jazper Nikolai A. Gedalya Jim, BSN, RN-BC [facility] Liaison Cell: [phone]

## 2018-12-24 NOTE — Evaluation (Signed)
Clinical/Bedside Swallow Evaluation Patient Details  Name: Katherine [name] MRN: [id number] Date of Birth: [birth year]  Today's Date: 12/24/2018 Time: SLP Start Time (ACUTE ONLY): 1500 SLP Stop Time (ACUTE ONLY): 1515 SLP Time Calculation (min) (ACUTE ONLY): 15 min  Past Medical History:  Past Medical History:  Diagnosis Date  . Angioedema   . Anxiety and depression   . CHF (congestive heart failure) ([place])   . Chronic constipation   . Chronic kidney disease    stage 3  . COPD (chronic obstructive pulmonary disease) ([place])   . Diabetes mellitus without complication ([place])   . Gallstones   . GERD (gastroesophageal reflux disease)   . Hypertension   . Left ventricular hypertrophy   . Osteoarthritis   . Prurigo nodularis   . Seizures ([place])   . Stroke ([place])   . Tobacco abuse   . Vitamin D deficiency disease    Past Surgical History:  Past Surgical History:  Procedure Laterality Date  . CHOLECYSTECTOMY    . 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   Procedure: TOTAL KNEE ARTHROPLASTY;  Surgeon: Hessie Knows, MD;  Location: [facility] ORS;  Service: Orthopedics;  Laterality: Right;   HPI:  59 year currently admitted with COVID pneumonia who developed abrupt change in mental status. Was admitted primarily with epistaxis and hemoptysis, attributed to apixaban +/- Afrin use. No report of dyspnea.  Found incidentally to have Covid-19 and pneumonia on CT done to reassess recent PE. She was started on remdesivir and dexamethasone. She was then transferred to [place] where she was also placed on enteric precautions because of diarrhea. Epistaxis stopped spontaneously and patient remained on floor on 2L/min of O2. On the morning of 11/10 she was noted to be confused with possible gaze deviation to the right, mumbling repeatedly and incomprehensibly, generalized rigidity but able to follow commands. Ptwas seen in 2019 for a similar incident, found to have mild oral  dysphagia.    Assessment / Plan / Recommendation Clinical Impression  Pt demonstrates adequate ability to tolerate thin liquids and soft solids. She passed a 3 oz water swallow easily. She reports typically consuming a regular texture diet despite missing dentition and she was able to demonstrate edentulous mastication of an Oreo. Recommend pt continue the soft diet and is currently ordered. No SLP f/u needed at this time.  SLP Visit Diagnosis: Dysphagia, unspecified (R13.10)    Aspiration Risk  Mild aspiration risk    Diet Recommendation Dysphagia 3 (Mech soft);Thin liquid   Liquid Administration via: Cup;Straw Medication Administration: Whole meds with liquid Supervision: Staff to assist with self feeding Compensations: Slow rate;Small sips/bites Postural Changes: Seated upright at 90 degrees    Other  Recommendations Oral Care Recommendations: Oral care BID   Follow up Recommendations None      Frequency and Duration            Prognosis        Swallow Study   General HPI: 43 year currently admitted with COVID pneumonia who developed abrupt change in mental status. Was admitted primarily with epistaxis and hemoptysis, attributed to apixaban +/- Afrin use. No report of dyspnea.  Found incidentally to have Covid-19 and pneumonia on CT done to reassess recent PE. She was started on remdesivir and dexamethasone. She was then transferred to [place] where she was also placed on enteric precautions because of diarrhea. Epistaxis stopped spontaneously and patient remained on floor on 2L/min of O2. On the morning of  11/10 she was noted to be confused with possible gaze deviation to the right, mumbling repeatedly and incomprehensibly, generalized rigidity but able to follow commands. Ptwas seen in 2019 for a similar incident, found to have mild oral dysphagia.  Type of Study: Bedside Swallow Evaluation Previous Swallow Assessment: see HPI Diet Prior to this Study: Dysphagia 3 (soft);Thin  liquids Temperature Spikes Noted: No Respiratory Status: Nasal cannula History of Recent Intubation: No Behavior/Cognition: Alert;Cooperative;Pleasant mood Oral Cavity Assessment: Within Functional Limits Oral Care Completed by SLP: No Oral Cavity - Dentition: Edentulous Self-Feeding Abilities: Needs assist Patient Positioning: Upright in bed Baseline Vocal Quality: Low vocal intensity Volitional Cough: Strong;Congested Volitional Swallow: Able to elicit    Oral/Motor/Sensory Function     Ice Chips Ice chips: Within functional limits Presentation: Spoon   Thin Liquid Thin Liquid: Within functional limits Presentation: Straw    Nectar Thick Nectar Thick Liquid: Not tested   Honey Thick Honey Thick Liquid: Not tested   Puree Puree: Not tested   Solid     Solid: Within functional limits Presentation: Self Fed     [name], MA [place] Pager [phone] Office [phone]  [name], [name] 12/24/2018,3:48 PM

## 2018-12-24 NOTE — Progress Notes (Signed)
EEG completed, results pending. 

## 2018-12-24 NOTE — Progress Notes (Signed)
PROGRESS NOTE                                                                                                                                                                                                             Patient Demographics:    Katherine [name], is a 78 y.o. female, DOB - 25-Feb-1940, [id number]  Outpatient Primary MD for the patient is Valerie Roys, DO    LOS - 3  Admit date - 12/21/2018    Chief complaint.  Nosebleed.     Brief Narrative -  Katherine [name]  is a 78 y.o. female, with history of recent PE diagnosed November 21, 2018 currently on Eliquis, generalized weakness and deconditioning, virtually bedbound and nursing home resident for several months, CKD 3, DM type II, hypertension, COPD, GERD, seizures, obesity, CVA who lives in a nursing home and initially presented to [facility] ER with chief complaints of nosebleeding.  She was found to have epistaxis and it was treated.  Subsequently testing suggested that she had COVID-19 infection and sent to [facility] for further care.  Patient is currently relatively symptom-free.  Denies any headache, no chest pain shortness of breath.  No productive cough.  No focal weakness which is new.   Subjective:   Patient in bed, appears more somnolent and much less responsive this morning able to answer basic questions and follow commands, besides headache no other complaints.   Assessment  & Plan :     1.  Acute Covid 19 Viral Pneumonitis during the ongoing 2020 Covid 19 Pandemic -mild disease currently on 1 L nasal cannula oxygen and can be titrated down to room air soon.  Continue IV steroids and remdesivir.  Monitor clinically.  Encouraged to sit up in chair and daytime use I-S and flutter valve for pulmonary toiletry and prone in bed at night if possible.   COVID-19 Labs  Recent Labs    12/22/18 0940 12/23/18 0410 12/24/18 0508  DDIMER 8.32* 7.75* 6.71*  CRP 5.3*  3.8* 2.4*    Lab Results  Component Value Date   SARSCOV2NAA POSITIVE (A) 12/19/2018   SARSCOV2NAA NOT DETECTED 11/24/2018   [place] NEGATIVE 11/21/2018   [place] NEGATIVE 09/06/2018     SpO2: 100 % O2 Flow Rate (L/min): 15 L/min  Hepatic Function Latest Ref Rng & Units 12/24/2018 12/23/2018 12/22/2018  Total  Protein 6.5 - 8.1 g/dL 6.6 6.8 6.9  Albumin 3.5 - 5.0 g/dL 2.3(L) 2.2(L) 2.1(L)  AST 15 - 41 U/L 35 43(H) 58(H)  ALT 0 - 44 U/L _0 Alk Phosphatase 38 - 126 U/L 192(H) 205(H) 227(H)  Total Bilirubin 0.3 - 1.2 mg/dL 0.7 0.4 0.5        Component Value Date/Time   BNP 70.9 12/24/2018 0508    2.  Recent PE diagnosed October 2020.  Due to recent nosebleed Eliquis held will continue heparin drip for now.  Nosebleed seems to have resolved.  3.  History of ESBL UTI.  She was started on Invanz on 12/19/2018.  Currently no signs of active infection, will rule out C. difficile as an #8 below thereafter if signs of any active infection will add antibiotics appropriately.  4.  Leukopenia.  Likely due to viral illness.  5.  History of CVA.  Continue statin for secondary prevention along with anticoagulation.  6.  Weakness, deconditioning, bedbound status.  Supportive care discharge back to SNF once better.  7.  GERD.  Switch PPI to Pepcid, rule out C. difficile first.  8.  Gastroenteritis.  Likely Covid related likely C. difficile colonized, PCR negative.  Diarrhea improved will monitor.  9.  CKD 2.  Baseline creatinine 1.2 will monitor.   10.   History of seizures.  On Keppra.  11.   Episode of unresponsiveness and right eye lateral gaze on 12/24/2018 morning.  Patient has had 2 similar episodes in the past, deemed to be due to seizures, she had thorough neurological work-up less than 1 year ago for 2 similar episodes and they were thought to be seizure related.  She was already on Eliquis hence not a candidate for TPA, last known time of normal exam  unknown hence again not a candidate for TPA, 2 previous similar episodes were seizure related.  My suspicion for stroke was low, head CT stat obtained negative.  Case discussed with neurologist Dr. Lorraine Lax who agrees with the plan.  IV Keppra 1 g given.  EEG ordered.  Will monitor.  MRI ordered by PCCM will be followed.  Even if she has a stroke she is already on maximal treatment.  PT, OT and speech will also evaluate the patient.  12. DM type II.  Continue Tradjenta, placed on sliding scale here.    Lab Results  Component Value Date   HGBA1C 5.1 12/21/2018   CBG (last 3)  Recent Labs    12/24/18 0810 12/24/18 0831 12/24/18 1015  GLUCAP 89 91 80     Condition - Fair  Family Communication  : Left for husband on 12/22/2018 at 10:52 AM  Code Status :  Full  Diet :   Diet Order            DIET SOFT Room service appropriate? Yes; Fluid consistency: Thin  Diet effective now               Disposition Plan  :  SNF, 12/25/2018 after she finishes her IV remdesivir.  Consults  :  None  Procedures  :     PUD Prophylaxis : pepcid  DVT Prophylaxis  :    Heparin gtt >> Eliquis  Lab Results  Component Value Date   PLT 343 12/24/2018    Inpatient Medications  Scheduled Meds:  apixaban  5 mg Oral BID   atorvastatin  80 mg Oral QPM   cholecalciferol  1,000 Units Oral Daily  dexamethasone (DECADRON) injection  2 mg Intravenous Q24H   docusate sodium  100 mg Oral BID   etomidate       famotidine  40 mg Oral Daily   fentaNYL       gabapentin  300 mg Oral QHS   hydrALAZINE  25 mg Oral TID   insulin aspart  0-5 Units Subcutaneous QHS   insulin aspart  0-9 Units Subcutaneous TID WC   linagliptin  5 mg Oral Daily   LORazepam  1 mg Intravenous STAT   midazolam       multivitamin with minerals  1 tablet Oral Daily   rocuronium bromide       vecuronium       cyanocobalamin  1,000 mcg Oral Daily   Continuous Infusions:  levETIRAcetam     remdesivir  100 mg in NS 250 mL 100 mg (12/23/18 1606)   PRN Meds:.acetaminophen, nitroGLYCERIN, [DISCONTINUED] ondansetron **OR** ondansetron (ZOFRAN) IV  Antibiotics  :    Anti-infectives (From admission, onward)   Start     Dose/Rate Route Frequency Ordered Stop   12/21/18 1600  remdesivir 100 mg in sodium chloride 0.9 % 250 mL IVPB     100 mg 500 mL/hr over 30 Minutes Intravenous Every 24 hours 12/21/18 1454 12/25/18 1559       Time Spent in minutes  [place] M.D on 12/24/2018 at 12:24 PM  To page go to [email or web address] - password [email or web address]  [facility] -  Office  [phone]    See all Orders from today for further details    Objective:   Vitals:   12/24/18 0918 12/24/18 1000 12/24/18 1100 12/24/18 1200  BP: (!) 127/114 (!) 155/96 (!) 154/95 (!) 139/91  Pulse: 96 84 80 88  Resp: (!) 26 (!) _0 Temp:      TempSrc:      SpO2: 100% 100% 100% 100%  Height:        Wt Readings from Last 3 Encounters:  12/19/18 102.5 kg  11/21/18 104 kg  11/01/18 104.1 kg     Intake/Output Summary (Last 24 hours) at 12/24/2018 1224 Last data filed at 12/24/2018 0418 Gross per 24 hour  Intake --  Output 600 ml  Net -600 ml     Physical Exam  Less responsive this morning, able to follow basic commands moving all 4 extremities, right eye gazing towards the right lateral side [email or web address],PERRAL Supple Neck,No JVD, No cervical lymphadenopathy appriciated.  Symmetrical Chest wall movement, Good air movement bilaterally, CTAB RRR,No Gallops, Rubs or new Murmurs, No Parasternal Heave +ve B.Sounds, Abd Soft, No tenderness, No organomegaly appriciated, No rebound - guarding or rigidity. No Cyanosis, Clubbing or edema, No new Rash or bruise    Data Review:    CBC Recent Labs  Lab 12/19/18 2125 12/20/18 0947 12/21/18 0721 12/22/18 0940 12/23/18 0410 12/24/18 0508 12/24/18 0900  WBC 2.9* 2.9* 1.5* 2.6* 2.3* 2.6*  --   HGB 8.0* 7.9* 8.0* 8.4* 8.5* 8.3* 9.5*  HCT 25.4*  25.4* 25.4* 26.6* 26.7* 26.6* 28.0*  PLT 235 265 284 320 322 343  --   MCV 83.3 84.1 84.4 84.7 84.5 85.3  --   MCH 26.2 26.2 26.6 26.8 26.9 26.6  --   MCHC 31.5 31.1 31.5 31.6 31.8 31.2  --   RDW 19.3* 19.3* 19.4* 19.7* 19.8* 20.0*  --   LYMPHSABS 0.9  --  0.5* 0.8 0.6* 0.7  --   MONOABS 0.3  --  0.1 0.2 0.1 0.3  --   EOSABS 0.1  --  0.0 0.0 0.0 0.0  --   BASOSABS 0.0  --  0.0 0.0 0.0 0.0  --     Chemistries  Recent Labs  Lab 12/19/18 2125 12/21/18 0721 12/22/18 0940 12/23/18 0410 12/24/18 0508 12/24/18 0900  NA 136 134* 133* 134* 136 136  K 4.1 4.3 4.1 4.2 4.3 4.1  CL 105 103 103 104 106  --   CO2 _0 --   GLUCOSE 97 131* 114* 124* 117*  --   BUN _1 26* 30*  --   CREATININE 1.45* 1.05* 1.42* 1.55* 1.64*  --   CALCIUM 8.6* 8.5* 8.3* 8.2* 8.3*  --   MG  --  1.8 1.6* 1.8 1.8  --   AST 89* 70* 58* 43* 35  --   ALT 33 _2 --   ALKPHOS 257* 242* 227* 205* 192*  --   BILITOT 0.8 0.7 0.5 0.4 0.7  --    ------------------------------------------------------------------------------------------------------------------ No results for input(s): CHOL, HDL, LDLCALC, TRIG, CHOLHDL, LDLDIRECT in the last 72 hours.  Lab Results  Component Value Date   HGBA1C 5.1 12/21/2018   ------------------------------------------------------------------------------------------------------------------ No results for input(s): TSH, T4TOTAL, T3FREE, THYROIDAB in the last 72 hours.  Invalid input(s): FREET3  Cardiac Enzymes No results for input(s): CKMB, TROPONINI, MYOGLOBIN in the last 168 hours.  Invalid input(s): CK ------------------------------------------------------------------------------------------------------------------    Component Value Date/Time   BNP 70.9 12/24/2018 0508    Micro Results Recent Results (from the past 240 hour(s))  SARS CORONAVIRUS 2 (TAT 6-24 HRS) Nasopharyngeal Nasopharyngeal Swab     Status: Abnormal   Collection Time: 12/19/18  11:24 PM   Specimen: Nasopharyngeal Swab  Result Value Ref Range Status   SARS Coronavirus 2 POSITIVE (A) NEGATIVE Final    Comment: RESULT CALLED TO, READ BACK BY AND VERIFIED WITH: Henderson Newcomer RN 15:20 12/20/18 ([email or web address]) (NOTE) SARS-CoV-2 target nucleic acids are DETECTED. The SARS-CoV-2 RNA is generally detectable in upper and lower respiratory specimens during the acute phase of infection. Positive results are indicative of active infection with SARS-CoV-2. Clinical  correlation with patient history and other diagnostic information is necessary to determine patient infection status. Positive results do  not rule out bacterial infection or co-infection with other viruses. The expected result is Negative. Fact Sheet for Patients: SugarRoll.be Fact Sheet for Healthcare Providers: https://www.woods-mathews.com/ This test is not yet approved or cleared by the [place] FDA and  has been authorized for detection and/or diagnosis of SARS-CoV-2 by FDA under an Emergency Use Authorization (EUA). This EUA will remain  in effect (meaning this test can be used) for  the duration of the COVID-19 declaration under Section 564(b)(1) of the Act, 21 U.S.C. section 360bbb-3(b)(1), unless the authorization is terminated or revoked sooner. Performed at [facility], [place] [address] [place],  [address]   MRSA PCR Screening     Status: None   Collection Time: 12/21/18  8:15 AM   Specimen: Nasal Mucosa; Nasopharyngeal  Result Value Ref Range Status   MRSA by PCR NEGATIVE NEGATIVE Final    Comment:        The GeneXpert MRSA Assay (FDA approved for NASAL specimens only), is one component of a comprehensive MRSA colonization surveillance program. It is not intended to diagnose MRSA infection nor to guide or monitor treatment for MRSA infections. Performed at [facility], [place],  [place], [place] [address]   C difficile  quick scan w PCR reflex     Status: Abnormal   Collection Time: 12/21/18  3:26 PM   Specimen: STOOL  Result Value Ref Range Status   C Diff antigen POSITIVE (A) NEGATIVE Final   C Diff toxin NEGATIVE NEGATIVE Final   C Diff interpretation Results are indeterminate. See PCR results.  Final    Comment: Performed at [facility], [place] [address] [place], [place] [address]  C. Diff by PCR, Reflexed     Status: None   Collection Time: 12/21/18  3:26 PM  Result Value Ref Range Status   Toxigenic C. Difficile by PCR NEGATIVE NEGATIVE Final    Comment: Patient is colonized with non toxigenic C. difficile. May not need treatment unless significant symptoms are present. Performed at [facility], [place] [address], [place], [place] [address]     Radiology Reports Dg Chest 1 View  Result Date: 11/24/2018 CLINICAL DATA:  Hemoptysis. Ex-smoker. CHF. EXAM: CHEST  1 VIEW COMPARISON:  CT of 11/21/2018. Most recent chest radiograph 11/21/2018. FINDINGS: Midline trachea. Moderate cardiomegaly. Atherosclerosis in the transverse aorta. No pleural effusion or pneumothorax. Apical lordotic positioning. Diffuse peribronchial thickening. No lobar consolidation. Left lung base scarring. IMPRESSION: 1. No acute cardiopulmonary disease. 2. Cardiomegaly with chronic interstitial thickening. 3.  Aortic Atherosclerosis (ICD10-I70.0). Electronically Signed   By: Abigail Miyamoto M.D.   On: 11/24/2018 20:56   Dg Chest 2 View  Result Date: 12/19/2018 CLINICAL DATA:  Hemoptysis, cough EXAM: CHEST - 2 VIEW COMPARISON:  11/24/2018 FINDINGS: Increased markings in the lower lungs, likely scarring, similar to prior study. Heart is borderline in size. Aortic atherosclerosis. No acute airspace opacities or effusions. No acute bony abnormality. IMPRESSION: Bibasilar linear densities, likely scarring. No definite acute process. Electronically Signed   By: Rolm Baptise M.D.   On: 12/19/2018 20:59   Ct Head Wo  Contrast  Result Date: 12/24/2018 CLINICAL DATA:  Altered level of consciousness EXAM: CT HEAD WITHOUT CONTRAST TECHNIQUE: Contiguous axial images were obtained from the base of the skull through the vertex without intravenous contrast. COMPARISON:  12/19/2018 FINDINGS: Brain: No evidence of acute infarction, hemorrhage, hydrocephalus, extra-axial collection or mass lesion/mass effect. Mild periventricular white matter hypodensity. Vascular: No hyperdense vessel or unexpected calcification. Skull: Normal. Negative for fracture or focal lesion. Sinuses/Orbits: No acute finding. Other: None. IMPRESSION: No acute intracranial pathology.  Small-vessel white matter disease. Electronically Signed   By: Eddie Candle M.D.   On: 12/24/2018 09:04   Ct Head Wo Contrast  Result Date: 12/19/2018 CLINICAL DATA:  Headache, on Eliquis EXAM: CT HEAD WITHOUT CONTRAST TECHNIQUE: Contiguous axial images were obtained from the base of the skull through the vertex without intravenous contrast. COMPARISON:  09/06/2018 FINDINGS: Brain: Patchy chronic small vessel disease throughout the deep white matter. No acute intracranial abnormality. Specifically, no hemorrhage, hydrocephalus, mass lesion, acute infarction, or significant intracranial injury. Vascular: No hyperdense vessel or unexpected calcification. Skull: No acute calvarial abnormality. Sinuses/Orbits: Visualized paranasal sinuses and mastoids clear. Orbital soft tissues unremarkable. 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 No acute intracranial abnormality. Electronically Signed   By: Rolm Baptise M.D.   On: 12/19/2018 21:00   Ct Angio Chest Pe W And/or Wo Contrast  Result Date: 12/20/2018 CLINICAL DATA:  Nose bleed.  Hemoptysis. EXAM: CT ANGIOGRAPHY CHEST WITH CONTRAST TECHNIQUE: Multidetector CT imaging of the chest was performed using the standard protocol during bolus  administration of intravenous contrast.  Multiplanar CT image reconstructions and MIPs were obtained to evaluate the vascular anatomy. CONTRAST:  64m OMNIPAQUE IOHEXOL 350 MG/ML SOLN COMPARISON:  11/21/2018 FINDINGS: Cardiovascular: Contrast injection is sufficient to demonstrate satisfactory opacification of the pulmonary arteries to the segmental level.Again identified are small subsegmental pulmonary emboli involving the left lower lobe, slightly improved from prior study. The main pulmonary artery is dilated measuring approximately 3.3 cm. Aortic calcifications are noted. The heart size is stable. Coronary artery calcifications are noted. Mediastinum/Nodes: --No mediastinal or hilar lymphadenopathy. --No axillary lymphadenopathy. --No supraclavicular lymphadenopathy. --Normal thyroid gland. --The esophagus is unremarkable Lungs/Pleura: There are worsening bibasilar airspace opacities. There is no pneumothorax. There are trace bilateral pleural effusions, right greater than left. The trachea is unremarkable. Upper Abdomen: No acute abnormality. Musculoskeletal: No chest wall abnormality. No acute or significant osseous findings. Review of the MIP images confirms the above findings. IMPRESSION: 1. Improving subsegmental pulmonary emboli involving the left lower lobe. 2. Worsening bibasilar airspace opacities which may represent atelectasis or infiltrates. 3. Trace bilateral pleural effusions. 4. Dilated main pulmonary artery which can be seen in patients with elevated pulmonary artery pressures. Aortic Atherosclerosis (ICD10-I70.0). Electronically Signed   By: CConstance HolsterM.D.   On: 12/20/2018 01:09

## 2018-12-24 NOTE — Progress Notes (Addendum)
NAMEDorinda [name], MRN:  [id number], DOB:  10-06-1940, LOS: 3 ADMISSION DATE:  12/21/2018, CONSULTATION DATE:  12/24/2018 REFERRING MD:  Candiss Norse, CHIEF COMPLAINT:  Altered mental status   Brief History   64 year currently admitted with COVID pneumonia who developed abrupt change in mental status.  History of present illness   Was admitted primarily with epistaxis and hemoptysis, attributed to apixaban +/- Afrin use. No report of dyspnea.  Found incidentally to have pneumonia on CT done to reassess recent PE. She was started on remdesivir and dexamethasone. She was then transferred to [facility] where she was also placed on enteric precautions because of diarrhea. Epistaxis stopped spontaneously and patient remained on floor on 2L/min of O2  On the morning of 11/10 she was noted to be confused with possible gaze deviation to the right, mumbling repeatedly and incomprehensibly, generalized rigidity but able to follow commands. Transferred to ICU for neuro-monitoring.   Past Medical History   Past Medical History:  Diagnosis Date  . Angioedema   . Anxiety and depression   . CHF (congestive heart failure) ([place])   . Chronic constipation   . Chronic kidney disease    stage 3  . COPD (chronic obstructive pulmonary disease) ([place])   . Diabetes mellitus without complication ([place])   . Gallstones   . GERD (gastroesophageal reflux disease)   . Hypertension   . Left ventricular hypertrophy   . Osteoarthritis   . Prurigo nodularis   . Seizures ([place])   . Stroke ([place])   . Tobacco abuse   . Vitamin D deficiency disease    Past Surgical History:  Procedure Laterality Date  . CHOLECYSTECTOMY    . 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 11/10 - CT head - no evidence of acute intracranial disease.  Micro Data:  SARS-CoV2 positive  Antimicrobials:  Remdesivir -  11/06 Dexamethasone - 11/06  Interim history/subjective:  Patient presented as described above. Following commands but mute apart from repeated mumbling. Over time her speech and rigidity improved spontaneously.  Objective   Blood pressure (!) 137/92, pulse 78, temperature (!) 97.4 F (36.3 C), temperature source Oral, resp. rate 19, height 5\' 3"  (1.6 m), SpO2 94 %.        Intake/Output Summary (Last 24 hours) at 12/24/2018 0910 Last data filed at 12/24/2018 0418 Gross per 24 hour  Intake -  Output 600 ml  Net -600 ml   There were no vitals filed for this visit.  Examination: General: Obese, chronically ill appearing woman, lying immobile in bed. HENT: disconjugate gaze with right eye deviated to the right. Arcus senilis, dense bilateral cataracts. Lungs: Clear to auscultation with no increase work of breathing, O2 requirements are unchanged. Cardiovascular: HS are distant but normal. Patient in SR. Abdomen: Soft with no masses. Extremities: Bilateral LE edema with ankle deformities bilaterally. Bilateral TKA scars. Neuro: Awake and mumbling, generalized rigidity but able to follow commands in all 4 limbs with no asymmetry. No limb rigidity. GU: no Foley.  Resolved Hospital Problem list   Epistaxis.  Assessment & Plan:   Critically ill due to new acute change in mental status of unclear etiology or clinical course, requiring frequent assessment of neurological status as at high risk for neurological decline. - Admitted ICU -Neuro vitals q1h  Acute encephalopathy with possible automatisms and spontaneous improvement suspicious for partial complex seizure. - Load and  continue leviracetam  - EEG to rule out epileptiform activity. - MRI to identify substrate for seizures.   COVID-19 pneumonia with severe disease requiring hospitalization -Remdisivir -Dexamethasone - Linagliptin as patient has T2DM   Best practice:  Diet: clear liquids Pain/Anxiety/Delirium protocol (if indicated): none to keep sensorium clear. VAP protocol (if indicated): N/A DVT prophylaxis: apixaban  GI prophylaxis: famotidine Glucose control: well controlled on mealtime and correction SSI Mobility: ad lib, but poor baseline mobility. Code Status: full Family Communication: Updated by [facility] Disposition: ICU - if mental status improves and investigations negative could potentially return to floor tomorrow.  Labs   CBC: Recent Labs  Lab 12/19/18 2125 12/20/18 0947 12/21/18 0721 12/22/18 0940 12/23/18 0410 12/24/18 0508  WBC 2.9* 2.9* 1.5* 2.6* 2.3* 2.6*  NEUTROABS 1.6*  --  0.9* 1.6* 1.6* 1.7  HGB 8.0* 7.9* 8.0* 8.4* 8.5* 8.3*  HCT 25.4* 25.4* 25.4* 26.6* 26.7* 26.6*  MCV 83.3 84.1 84.4 84.7 84.5 85.3  PLT 235 265 284 320 322 [id number]    Basic Metabolic Panel: Recent Labs  Lab 12/19/18 2125 12/21/18 0721 12/22/18 0940 12/23/18 0410 12/24/18 0508  NA 136 134* 133* 134* 136  K 4.1 4.3 4.1 4.2 4.3  CL 105 103 103 104 106  CO2 24 22 22 22 22   GLUCOSE 97 131* 114* 124* 117*  BUN 19 17 20  26* 30*  CREATININE 1.45* 1.05* 1.42* 1.55* 1.64*  CALCIUM 8.6* 8.5* 8.3* 8.2* 8.3*  MG  --  1.8 1.6* 1.8 1.8   GFR: Estimated Creatinine Clearance: 32.8 mL/min (A) (by C-G formula based on SCr of 1.64 mg/dL (H)). Recent Labs  Lab 12/19/18 1125 12/19/18 2125 12/20/18 0009  12/21/18 0721 12/22/18 0940 12/23/18 0410 12/24/18 0508  PROCALCITON <0.10  --   --   --   --   --   --   --   WBC  --  2.9*  --    < > 1.5* 2.6* 2.3* 2.6*  LATICACIDVEN  --  1.0 0.7  --   --   --   --   --    < > = values in this interval not displayed.    Liver Function Tests: Recent Labs  Lab  12/19/18 2125 12/21/18 0721 12/22/18 0940 12/23/18 0410 12/24/18 0508  AST 89* 70* 58* 43* 35  ALT 33 31 30 28 25   ALKPHOS 257* 242* 227* 205* 192*  BILITOT 0.8 0.7 0.5 0.4 0.7  PROT 6.8 6.8 6.9 6.8 6.6  ALBUMIN 2.1* 2.0* 2.1* 2.2* 2.3*   No results for input(s): LIPASE, AMYLASE in the last 168 hours. No results for input(s): AMMONIA in the last 168 hours.  ABG    Component Value Date/Time   PHART 7.47 (H) 12/25/2017 1653   PCO2ART 45 12/25/2017 1653   PO2ART 78 (L) 12/25/2017 1653   HCO3 24.7 12/19/2018 2125   ACIDBASEDEF 2.8 (H) 06/12/2017 1250   O2SAT 98.6 12/19/2018 2125     Coagulation Profile: Recent Labs  Lab 12/19/18 2125 12/20/18 0947  INR 1.4* 1.3*    Cardiac Enzymes: No results for input(s): CKTOTAL, CKMB, CKMBINDEX, TROPONINI in the last 168 hours.  HbA1C:  HB A1C (BAYER DCA - WAIVED)  Date/Time Value Ref Range Status  09/07/2015 09:58 AM 5.8 <7.0 % Final    Comment:                                          Diabetic Adult            <7.0                                       Healthy Adult        4.3 - 5.7                                                           (DCCT/NGSP) American Diabetes Association's Summary of Glycemic Recommendations for Adults with Diabetes: Hemoglobin A1c <7.0%. More stringent glycemic goals (A1c <6.0%) may further reduce complications at the cost of increased risk of hypoglycemia.    Hgb A1c MFr Bld  Date/Time Value Ref Range Status  12/21/2018 03:17 PM 5.1 4.8 - 5.6 % Final    Comment:    (NOTE) Pre diabetes:          5.7%-6.4% Diabetes:              >6.4% Glycemic control for   <7.0% adults with diabetes   08/21/2018 01:04 PM 5.5 4.8 - 5.6 % Final    Comment:    (NOTE)         Prediabetes: 5.7 - 6.4         Diabetes: >6.4         Glycemic control for adults with diabetes: <7.0     CBG: Recent Labs  Lab 12/23/18 0805 12/23/18 1142 12/23/18 1804 12/23/18 2105 12/24/18 0831  GLUCAP 104* 94 108* 144* 91       CRITICAL CARE Performed by: Kipp Brood   Total critical care time: 40 minutes  Critical care time was exclusive of separately billable procedures and treating other patients.  Critical care was necessary to treat or prevent imminent or life-threatening deterioration.  Critical care was time spent personally by me on the following activities: development of treatment plan with patient and/or surrogate as well as nursing, discussions with consultants, evaluation of patient's response to treatment, examination of patient, obtaining history from patient or surrogate, ordering and performing treatments and interventions, ordering and review of laboratory studies, ordering and review of radiographic studies, pulse oximetry, re-evaluation of patient's condition and participation in multidisciplinary rounds.  Kipp Brood, MD [facility] ICU Physician [place]  Pager: [phone] Mobile: [phone] After hours: [phone].  ADDENDUM  Case discussed with Dr Candiss Norse: previously investigated for similar episode at [facility] with negative MRI, deemed seizures.  Presently back to baseline.  Transfer back to floor and continue current AED's

## 2018-12-24 NOTE — Progress Notes (Signed)
Notified by Pieter Partridge, RN that patient was having stroke like symptoms. PCU CN called a rapid response. Myself and Alyse Low, RT went to assess patient. Upon arrival to the room patient was found to be verbally unresponsive with right eye dysconjugate gaze to the right side. Right side rigidness and moaning sounds. Oxygen sats were in the mid 80's. Patient was placed on 2L [place]. Dr Candiss Norse had been notified prior to me arriving and had assessed the patient and placed new orders for STAT head CT. NIH at time was 24. Code stroke was activated. Wille Glaser, PCU CN, pt's nurse, Alyse Low, RT and [place], [facility] were all agreeable of assessment and POC. Patient was transported to CT. Dr Lynetta Mare was notified of patient's condition and voiced concerned about LOC and maintaining her own airway. Patient was transferred to ICU and assessed by Dr Lynetta Mare. New orders were placed. Will continue to monitor patient.

## 2018-12-24 NOTE — Progress Notes (Signed)
Updated family on events from today.  Patient is doing better in ICU.  Plan to transfer back to PCU with continuing neuro assessments.  All questions answered.

## 2018-12-24 NOTE — Progress Notes (Addendum)
D: I was called in the room by the primary RN to help with the patient.  When I entered the room, the patient had rigid extremities, was verbally unresponsive, but still able to slightly move fingers and toes.  Patient did have own respiratory drive and was not obtunded at this time.  Patient would not wake to voice at this time.  Right eye was deviated to the right.    A: Oxygen saturations were in the mid 80's at the time so I applied oxygen.  Other vital signs were stable.  Dr. Candiss Norse was at bedside and writer asked if he wanted to give ativan for what looked to be a seizure.  Dr. Candiss Norse left the bedside and said "The patient will be fine."  The doctor declined to give the RN order for ativan and ordered Keppra and a STAT head CT.  The Probation officer and primary RN assessed patient again and decided for the patient's safety that we will call  Charge RN.  Charge RN was at room and discussed best route for patient safety.  A rapid response was called.  When rapid response team arrived, we assess patient and decided to call a code stroke based on new LOC changes, eye deviation and rigidity of extremities.  VS continued to be stable. Please see EMR for other findings.   R: Patient brought to [facility] and ICU physician and neruology physician notified.  Plan to transfer to ICU at this time for increased monitoring of changing LOC.

## 2018-12-24 NOTE — Procedures (Signed)
Patient Name: Katherine [name]  MRN: [id number]  Epilepsy Attending: [name]  Referring Physician/Provider: Dr. Dyann Ruddle Date: 12/24/2018 Duration: 24.44 minutes  Patient history: 78 year old female with an episode of left gaze deviation and altered mental status.  EEG to evaluate for seizures.  Level of alertness: Awake/lethargic  AEDs during EEG study: Keppra, gabapentin, Ativan, Versed  Technical aspects: This EEG study was done with scalp electrodes positioned according to the 10-20 International system of electrode placement. Electrical activity was acquired at a sampling rate of 500Hz  and reviewed with a high frequency filter of 70Hz  and a low frequency filter of 1Hz . EEG data were recorded continuously and digitally stored.   Description: EEG showed continuous generalized 3- 6 Hz theta-delta slowing.  EEG was reactive to verbal stimuli.  No clear posterior dominant rhythm was seen.  Hyperventilation and photic stimulation were not performed.  Abnormality Continued slow, generalized  IMPRESSION: This study is suggestive of moderate diffuse encephalopathy, nonspecific etiology. No seizures or epileptiform discharges were seen throughout the recording.

## 2018-12-25 LAB — D-DIMER, QUANTITATIVE: D-Dimer, Quant: 5.76 ug/mL-FEU — ABNORMAL HIGH (ref 0.00–0.50)

## 2018-12-25 LAB — CBC WITH DIFFERENTIAL/PLATELET
Abs Immature Granulocytes: 0.03 10*3/uL (ref 0.00–0.07)
Basophils Absolute: 0 10*3/uL (ref 0.0–0.1)
Basophils Relative: 0 %
Eosinophils Absolute: 0 10*3/uL (ref 0.0–0.5)
Eosinophils Relative: 0 %
HCT: 25.7 % — ABNORMAL LOW (ref 36.0–46.0)
Hemoglobin: 8.1 g/dL — ABNORMAL LOW (ref 12.0–15.0)
Immature Granulocytes: 1 %
Lymphocytes Relative: 25 %
Lymphs Abs: 1.2 10*3/uL (ref 0.7–4.0)
MCH: 26.6 pg (ref 26.0–34.0)
MCHC: 31.5 g/dL (ref 30.0–36.0)
MCV: 84.5 fL (ref 80.0–100.0)
Monocytes Absolute: 0.5 10*3/uL (ref 0.1–1.0)
Monocytes Relative: 10 %
Neutro Abs: 3 10*3/uL (ref 1.7–7.7)
Neutrophils Relative %: 64 %
Platelets: 305 10*3/uL (ref 150–400)
RBC: 3.04 MIL/uL — ABNORMAL LOW (ref 3.87–5.11)
RDW: 20.3 % — ABNORMAL HIGH (ref 11.5–15.5)
WBC: 4.7 10*3/uL (ref 4.0–10.5)
nRBC: 1.3 % — ABNORMAL HIGH (ref 0.0–0.2)

## 2018-12-25 LAB — COMPREHENSIVE METABOLIC PANEL
ALT: 23 U/L (ref 0–44)
AST: 33 U/L (ref 15–41)
Albumin: 2.2 g/dL — ABNORMAL LOW (ref 3.5–5.0)
Alkaline Phosphatase: 165 U/L — ABNORMAL HIGH (ref 38–126)
Anion gap: 7 (ref 5–15)
BUN: 32 mg/dL — ABNORMAL HIGH (ref 8–23)
CO2: 22 mmol/L (ref 22–32)
Calcium: 8 mg/dL — ABNORMAL LOW (ref 8.9–10.3)
Chloride: 108 mmol/L (ref 98–111)
Creatinine, Ser: 1.51 mg/dL — ABNORMAL HIGH (ref 0.44–1.00)
GFR calc Af Amer: 38 mL/min — ABNORMAL LOW (ref >60)
GFR calc non Af Amer: 33 mL/min — ABNORMAL LOW (ref >60)
Glucose, Bld: 94 mg/dL (ref 70–99)
Potassium: 4 mmol/L (ref 3.5–5.1)
Sodium: 137 mmol/L (ref 135–145)
Total Bilirubin: 0.7 mg/dL (ref 0.3–1.2)
Total Protein: 6.2 g/dL — ABNORMAL LOW (ref 6.5–8.1)

## 2018-12-25 LAB — C-REACTIVE PROTEIN: CRP: 1.7 mg/dL — ABNORMAL HIGH (ref <1.0)

## 2018-12-25 LAB — GLUCOSE, CAPILLARY
Glucose-Capillary: 72 mg/dL (ref 70–99)
Glucose-Capillary: 92 mg/dL (ref 70–99)
Glucose-Capillary: 105 mg/dL — ABNORMAL HIGH (ref 70–99)
Glucose-Capillary: 114 mg/dL — ABNORMAL HIGH (ref 70–99)

## 2018-12-25 LAB — BRAIN NATRIURETIC PEPTIDE: B Natriuretic Peptide: 81.1 pg/mL (ref 0.0–100.0)

## 2018-12-25 LAB — MAGNESIUM: Magnesium: 1.9 mg/dL (ref 1.7–2.4)

## 2018-12-25 LAB — PROLACTIN: Prolactin: 17.4 ng/mL (ref 4.8–23.3)

## 2018-12-25 MED ORDER — ENOXAPARIN SODIUM 100 MG/ML ~~LOC~~ SOLN
100.0000 mg | Freq: Two times a day (BID) | SUBCUTANEOUS | Status: DC
  Administered 2018-12-25 – 2018-12-26 (×3): 100 mg via SUBCUTANEOUS
  Filled 2018-12-25 (×5): qty 1

## 2018-12-25 NOTE — Progress Notes (Signed)
PROGRESS NOTE                                                                                                                                                                                                             Patient Demographics:    Katherine [name], is a 78 y.o. female, DOB - [birth year], NOB:[id number]  Outpatient Primary MD for the patient is Valerie Roys, DO    LOS - 4  Admit date - 12/21/2018    Chief complaint.  Nosebleed.     Brief Narrative -  Katherine [name]  is a 78 y.o. female, with history of recent PE diagnosed November 21, 2018 currently on Eliquis, generalized weakness and deconditioning, virtually bedbound and nursing home resident for several months, CKD 3, DM type II, hypertension, COPD, GERD, seizures, obesity, CVA who lives in a nursing home and initially presented to [facility] ER with chief complaints of nosebleeding.  She was found to have epistaxis and it was treated.  Subsequently testing suggested that she had COVID-19 infection and sent to [facility] for further care.  Patient is currently relatively symptom-free.  Denies any headache, no chest pain shortness of breath.  No productive cough.  No focal weakness which is new.   Subjective:   Patient in bed, denies any headache, no chest pain or shortness of breath says she is hungry and would like to eat breakfast.   Assessment  & Plan :     1.  Acute Covid 19 Viral Pneumonitis during the ongoing 2020 Covid 19 Pandemic -mild disease currently on 1 L nasal cannula oxygen and can be titrated down to room air soon.  Continue IV steroids and remdesivir.  Monitor clinically.  Encouraged to sit up in chair and daytime use I-S and flutter valve for pulmonary toiletry and prone in bed at night if possible.   COVID-19 Labs  Recent Labs    12/23/18 0410 12/24/18 0508 12/25/18 0110  DDIMER 7.75* 6.71* 5.76*  CRP 3.8* 2.4* 1.7*    Lab Results  Component  Value Date   SARSCOV2NAA POSITIVE (A) 12/19/2018   SARSCOV2NAA NOT DETECTED 11/24/2018   [place] NEGATIVE 11/21/2018   [place] NEGATIVE 09/06/2018     SpO2: 92 % O2 Flow Rate (L/min): 2 L/min  Hepatic Function Latest Ref Rng & Units 12/25/2018 12/24/2018 12/23/2018  Total Protein 6.5 -  8.1 g/dL 6.2(L) 6.6 6.8  Albumin 3.5 - 5.0 g/dL 2.2(L) 2.3(L) 2.2(L)  AST 15 - 41 U/L 33 35 43(H)  ALT 0 - 44 U/L 23 25 28   Alk Phosphatase 38 - 126 U/L 165(H) 192(H) 205(H)  Total Bilirubin 0.3 - 1.2 mg/dL 0.7 0.7 0.4        Component Value Date/Time   BNP 81.1 12/25/2018 0110    2.  Recent PE diagnosed October 2020.    Continue Eliquis.  Nosebleed seems to have resolved.  3.  History of ESBL UTI.  She was started on Invanz on 12/19/2018.  Currently no signs of active infection, will rule out C. difficile as an #8 below thereafter if signs of any active infection will add antibiotics appropriately.  4.  Leukopenia.  Likely due to viral illness.  5.  History of CVA.  Continue statin for secondary prevention along with anticoagulation.  6.  Weakness, deconditioning, bedbound status.  Supportive care discharge back to SNF once better.  7.  GERD.  Switch PPI to Pepcid, rule out C. difficile first.  8.  Gastroenteritis.  Likely Covid related likely C. difficile colonized, PCR negative.  Diarrhea improved will monitor.  9.  CKD 2.  Baseline creatinine 1.2 will monitor.   10.   History of seizures.  On Keppra.  11.   Episode of unresponsiveness  on 12/24/2018 morning.  Patient has had 2 similar episodes in the past, deemed to be due to seizures, she had thorough neurological work-up less than 1 year ago for 2 similar episodes and they were thought to be seizure related. She was already on Eliquis hence not a candidate for TPA, last known time of normal exam unknown hence again not a candidate for TPA.  Given Keppra with gradual return to baseline within a few hours, head CT  negative, diffuse nonspecific encephalopathy, case was discussed by me with neurologist Dr. Lorraine Lax 3 times during the day.  Continue home dose Keppra and monitor.  No MRI needed per neurology for now.  Speech, PT OT to also evaluate the patient.  Note patient has right-sided lateral gaze in both eyes right more than left from birth.  12.  Recent episodes of epistaxis prompting this admission at [facility].  Resolved.    13. DM type II.  Continue Tradjenta, placed on sliding scale here.    Lab Results  Component Value Date   HGBA1C 5.1 12/21/2018   CBG (last 3)  Recent Labs    12/24/18 1728 12/24/18 2116 12/25/18 0746  GLUCAP 108* 149* 72     Condition - Fair  Family Communication  : Left for husband on 12/22/2018 at 10:52 AM  Code Status :  Full  Diet :   Diet Order            DIET SOFT Room service appropriate? Yes; Fluid consistency: Thin  Diet effective now               Disposition Plan  :  SNF, 12/25/2018 after she finishes her IV remdesivir.  Consults  :  None  Procedures  :     PUD Prophylaxis : pepcid  DVT Prophylaxis  :    Heparin gtt >> Eliquis  Lab Results  Component Value Date   PLT 305 12/25/2018    Inpatient Medications  Scheduled Meds: . atorvastatin  80 mg Oral QPM  . cholecalciferol  1,000 Units Oral Daily  . dexamethasone (DECADRON) injection  2 mg Intravenous Q24H  .  docusate sodium  100 mg Oral BID  . enoxaparin (LOVENOX) injection  100 mg Subcutaneous Q12H  . famotidine  40 mg Oral Daily  . gabapentin  300 mg Oral QHS  . hydrALAZINE  25 mg Oral TID  . insulin aspart  0-5 Units Subcutaneous QHS  . insulin aspart  0-9 Units Subcutaneous TID WC  . linagliptin  5 mg Oral Daily  . multivitamin with minerals  1 tablet Oral Daily  . cyanocobalamin  1,000 mcg Oral Daily   Continuous Infusions: . levETIRAcetam 500 mg (12/25/18 0910)   PRN Meds:.acetaminophen, nitroGLYCERIN, [DISCONTINUED] ondansetron **OR** ondansetron (ZOFRAN) IV   Antibiotics  :    Anti-infectives (From admission, onward)   Start     Dose/Rate Route Frequency Ordered Stop   12/21/18 1600  remdesivir 100 mg in sodium chloride 0.9 % 250 mL IVPB     100 mg 500 mL/hr over 30 Minutes Intravenous Every 24 hours 12/21/18 1454 12/24/18 1631       Time Spent in minutes  30   Lala Lund M.D on 12/25/2018 at 10:36 AM  To page go to [email or web address] - password [email or web address]  [facility] -  Office  [phone]    See all Orders from today for further details    Objective:   Vitals:   12/25/18 0500 12/25/18 0600 12/25/18 0700 12/25/18 0730  BP: 121/79 119/74 118/69 129/84  Pulse: 70 75 74 76  Resp: 17 (!) 21 20 18   Temp:    98.4 F (36.9 C)  TempSrc:    Oral  SpO2: 94% 95% 93% 92%  Height:        Wt Readings from Last 3 Encounters:  12/19/18 102.5 kg  11/21/18 104 kg  11/01/18 104.1 kg     Intake/Output Summary (Last 24 hours) at 12/25/2018 1036 Last data filed at 12/25/2018 0600 Gross per 24 hour  Intake 0 ml  Output 1000 ml  Net -1000 ml     Physical Exam  Awake Alert, chronic right-sided deviation of both eyes right more than left, 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    [place].AT,PERRAL Supple Neck,No JVD, No cervical lymphadenopathy appriciated.  Symmetrical Chest wall movement, Good air movement bilaterally, CTAB RRR,No Gallops, Rubs or new Murmurs, No Parasternal Heave +ve B.Sounds, Abd Soft, No tenderness, No organomegaly appriciated, No rebound - guarding or rigidity. No Cyanosis, Clubbing or edema, No new Rash or bruise     Data Review:    CBC Recent Labs  Lab 12/21/18 0721 12/22/18 0940 12/23/18 0410 12/24/18 0508 12/24/18 0900 12/25/18 0110  WBC 1.5* 2.6* 2.3* 2.6*  --  4.7  HGB 8.0* 8.4* 8.5* 8.3* 9.5* 8.1*  HCT 25.4* 26.6* 26.7* 26.6* 28.0* 25.7*  PLT 284 320 322 343  --  305  MCV 84.4 84.7 84.5 85.3  --  84.5  MCH 26.6 26.8 26.9 26.6  --   26.6  MCHC 31.5 31.6 31.8 31.2  --  31.5  RDW 19.4* 19.7* 19.8* 20.0*  --  20.3*  LYMPHSABS 0.5* 0.8 0.6* 0.7  --  1.2  MONOABS 0.1 0.2 0.1 0.3  --  0.5  EOSABS 0.0 0.0 0.0 0.0  --  0.0  BASOSABS 0.0 0.0 0.0 0.0  --  0.0    Chemistries  Recent Labs  Lab 12/21/18 0721 12/22/18 0940 12/23/18 0410 12/24/18 0508 12/24/18 0900 12/25/18 0110  NA 134* 133* 134* 136 136 137  K 4.3 4.1 4.2 4.3  4.1 4.0  CL 103 103 104 106  --  108  CO2 22 22 22 22   --  22  GLUCOSE 131* 114* 124* 117*  --  94  BUN 17 20 26* 30*  --  32*  CREATININE 1.05* 1.42* 1.55* 1.64*  --  1.51*  CALCIUM 8.5* 8.3* 8.2* 8.3*  --  8.0*  MG 1.8 1.6* 1.8 1.8  --  1.9  AST 70* 58* 43* 35  --  33  ALT 31 30 28 25   --  23  ALKPHOS 242* 227* 205* 192*  --  165*  BILITOT 0.7 0.5 0.4 0.7  --  0.7   ------------------------------------------------------------------------------------------------------------------ No results for input(s): CHOL, HDL, LDLCALC, TRIG, CHOLHDL, LDLDIRECT in the last 72 hours.  Lab Results  Component Value Date   HGBA1C 5.1 12/21/2018   ------------------------------------------------------------------------------------------------------------------ No results for input(s): TSH, T4TOTAL, T3FREE, THYROIDAB in the last 72 hours.  Invalid input(s): FREET3  Cardiac Enzymes No results for input(s): CKMB, TROPONINI, MYOGLOBIN in the last 168 hours.  Invalid input(s): CK ------------------------------------------------------------------------------------------------------------------    Component Value Date/Time   BNP 81.1 12/25/2018 0110    Micro Results Recent Results (from the past 240 hour(s))  SARS CORONAVIRUS 2 (TAT 6-24 HRS) Nasopharyngeal Nasopharyngeal Swab     Status: Abnormal   Collection Time: 12/19/18 11:24 PM   Specimen: Nasopharyngeal Swab  Result Value Ref Range Status   SARS Coronavirus 2 POSITIVE (A) NEGATIVE Final    Comment: RESULT CALLED TO, READ BACK BY AND VERIFIED  WITH: Henderson Newcomer RN 15:20 12/20/18 ([email or web address]) (NOTE) SARS-CoV-2 target nucleic acids are DETECTED. The SARS-CoV-2 RNA is generally detectable in upper and lower respiratory specimens during the acute phase of infection. Positive results are indicative of active infection with SARS-CoV-2. Clinical  correlation with patient history and other diagnostic information is necessary to determine patient infection status. Positive results do  not rule out bacterial infection or co-infection with other viruses. The expected result is Negative. Fact Sheet for Patients: SugarRoll.be Fact Sheet for Healthcare Providers: https://www.woods-mathews.com/ This test is not yet approved or cleared by the [place] FDA and  has been authorized for detection and/or diagnosis of SARS-CoV-2 by FDA under an Emergency Use Authorization (EUA). This EUA will remain  in effect (meaning this test can be used) for  the duration of the COVID-19 declaration under Section 564(b)(1) of the Act, 21 U.S.C. section 360bbb-3(b)(1), unless the authorization is terminated or revoked sooner. Performed at [facility], [place] [address] [place], [place] [address]   MRSA PCR Screening     Status: None   Collection Time: 12/21/18  8:15 AM   Specimen: Nasal Mucosa; Nasopharyngeal  Result Value Ref Range Status   MRSA by PCR NEGATIVE NEGATIVE Final    Comment:        The GeneXpert MRSA Assay (FDA approved for NASAL specimens only), is one component of a comprehensive MRSA colonization surveillance program. It is not intended to diagnose MRSA infection nor to guide or monitor treatment for MRSA infections. Performed at [facility], [address] [place], [place] [address]   C difficile quick scan w PCR reflex     Status: Abnormal   Collection Time: 12/21/18  3:26 PM   Specimen: STOOL  Result Value Ref Range Status   C Diff antigen POSITIVE (A) NEGATIVE Final    C Diff toxin NEGATIVE NEGATIVE Final   C Diff interpretation Results are indeterminate. See PCR results.  Final  Comment: Performed at [facility], [place] [address] [place], [place] [address]  C. Diff by PCR, Reflexed     Status: None   Collection Time: 12/21/18  3:26 PM  Result Value Ref Range Status   Toxigenic C. Difficile by PCR NEGATIVE NEGATIVE Final    Comment: Patient is colonized with non toxigenic C. difficile. May not need treatment unless significant symptoms are present. Performed at [facility], [place] [address], [place], [place] [address]     Radiology Reports Dg Chest 2 View  Result Date: 12/19/2018 CLINICAL DATA:  Hemoptysis, cough EXAM: CHEST - 2 VIEW COMPARISON:  11/24/2018 FINDINGS: Increased markings in the lower lungs, likely scarring, similar to prior study. Heart is borderline in size. Aortic atherosclerosis. No acute airspace opacities or effusions. No acute bony abnormality. IMPRESSION: Bibasilar linear densities, likely scarring. No definite acute process. Electronically Signed   By: Rolm Baptise M.D.   On: 12/19/2018 20:59   Ct Head Wo Contrast  Result Date: 12/24/2018 CLINICAL DATA:  Altered level of consciousness EXAM: CT HEAD WITHOUT CONTRAST TECHNIQUE: Contiguous axial images were obtained from the base of the skull through the vertex without intravenous contrast. COMPARISON:  12/19/2018 FINDINGS: Brain: No evidence of acute infarction, hemorrhage, hydrocephalus, extra-axial collection or mass lesion/mass effect. Mild periventricular white matter hypodensity. Vascular: No hyperdense vessel or unexpected calcification. Skull: Normal. Negative for fracture or focal lesion. Sinuses/Orbits: No acute finding. Other: None. IMPRESSION: No acute intracranial pathology.  Small-vessel white matter disease. Electronically Signed   By: Eddie Candle M.D.   On: 12/24/2018 09:04   Ct Head Wo Contrast  Result Date: 12/19/2018 CLINICAL DATA:   Headache, on Eliquis EXAM: CT HEAD WITHOUT CONTRAST TECHNIQUE: Contiguous axial images were obtained from the base of the skull through the vertex without intravenous contrast. COMPARISON:  09/06/2018 FINDINGS: Brain: Patchy chronic small vessel disease throughout the deep white matter. No acute intracranial abnormality. Specifically, no hemorrhage, hydrocephalus, mass lesion, acute infarction, or significant intracranial injury. Vascular: No hyperdense vessel or unexpected calcification. Skull: No acute calvarial abnormality. Sinuses/Orbits: Visualized paranasal sinuses and mastoids clear. Orbital soft tissues unremarkable. 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 No acute intracranial abnormality. Electronically Signed   By: Rolm Baptise M.D.   On: 12/19/2018 21:00   Ct Angio Chest Pe W And/or Wo Contrast  Result Date: 12/20/2018 CLINICAL DATA:  Nose bleed.  Hemoptysis. EXAM: CT ANGIOGRAPHY CHEST WITH CONTRAST TECHNIQUE: Multidetector CT imaging of the chest was performed using the standard protocol during bolus administration of intravenous contrast. Multiplanar CT image reconstructions and MIPs were obtained to evaluate the vascular anatomy. CONTRAST:  68m OMNIPAQUE IOHEXOL 350 MG/ML SOLN COMPARISON:  11/21/2018 FINDINGS: Cardiovascular: Contrast injection is sufficient to demonstrate satisfactory opacification of the pulmonary arteries to the segmental level.Again identified are small subsegmental pulmonary emboli involving the left lower lobe, slightly improved from prior study. The main pulmonary artery is dilated measuring approximately 3.3 cm. Aortic calcifications are noted. The heart size is stable. Coronary artery calcifications are noted. Mediastinum/Nodes: --No mediastinal or hilar lymphadenopathy. --No axillary lymphadenopathy. --No supraclavicular lymphadenopathy. --Normal thyroid gland. --The esophagus is unremarkable Lungs/Pleura: There are  worsening bibasilar airspace opacities. There is no pneumothorax. There are trace bilateral pleural effusions, right greater than left. The trachea is unremarkable. Upper Abdomen: No acute abnormality. Musculoskeletal: No chest wall abnormality. No acute or significant osseous findings. Review of the MIP images confirms the above findings. IMPRESSION: 1. Improving subsegmental  pulmonary emboli involving the left lower lobe. 2. Worsening bibasilar airspace opacities which may represent atelectasis or infiltrates. 3. Trace bilateral pleural effusions. 4. Dilated main pulmonary artery which can be seen in patients with elevated pulmonary artery pressures. Aortic Atherosclerosis (ICD10-I70.0). Electronically Signed   By: Constance Holster M.D.   On: 12/20/2018 01:09

## 2018-12-25 NOTE — Evaluation (Signed)
Occupational Therapy Evaluation Patient Details Name: Katherine [name] MRN: [id number] DOB: 20-Jan-1941 Today's Date: 12/25/2018    History of Present Illness 78 y.o. female, with history of recent PE diagnosed November 21, 2018 currently on Eliquis, generalized weakness and deconditioning, virtually bedbound and nursing home resident for several months, CKD 3, DM type II, hypertension, COPD, GERD, seizures, obesity, CVA who lives in a nursing home and initially presented to [facility] ER with chief complaints of nosebleeding. Subsequently testing suggested that she had COVID-19 infection and sent to [facility] for further care of pneumonitis.   Clinical Impression   Pt seen for additional OT eval, noted recent transfer to/from ICU and ?neuro changes in pt status prior to transfer. Pt appears to be functioning close to or at her baseline. At baseline pt bedbound with assist for majority of ADL, reports able to self-feed given increased time/effort, was residing at [facility]. Pt sleepy today and less participatory, overall strength appears consistent with previous assessment (RUE weaker than LUE secondary to previous CVA). Pt denies vision changes. Recommend pt return to SNF level of care at time of discharge. Recommend pt OOB to chair with nursing staff assist (and as able to tolerate) via maximove. No further acute OT needs identified. Will sign off. Thank you for this referral.     Follow Up Recommendations  SNF    Equipment Recommendations  Other (comment)(defer to next venue)    Recommendations for Other Services       Precautions / Restrictions Precautions Precautions: Fall Restrictions Weight Bearing Restrictions: No      Mobility Bed Mobility Overal bed mobility: Needs Assistance             General bed mobility comments: pt non participatory in bed mobility this session, stating "I want to go back to sleep"  Transfers                 General transfer comment: NA bedbound at  baseline    Balance                                           ADL either performed or assessed with clinical judgement   ADL Overall ADL's : Needs assistance/impaired Eating/Feeding: Set up;Minimal assistance;Bed level   Grooming: Minimal assistance;Set up;Bed level   Upper Body Bathing: Bed level;Moderate assistance;Maximal assistance   Lower Body Bathing: Maximal assistance;Total assistance;Bed level   Upper Body Dressing : Bed level;Moderate assistance;Maximal assistance   Lower Body Dressing: Maximal assistance;Total assistance;Bed level       Toileting- Clothing Manipulation and Hygiene: Total assistance;+2 for safety/equipment;Bed level         General ADL Comments: max-totalA for UB/LB ADL; pt sleepy this session and minimally participatory      Vision Baseline Vision/History: (R eye weakness) Patient Visual Report: No change from baseline Vision Assessment?: Yes Eye Alignment: Impaired (comment)(delayed R eye movement, not able to maintain at midline) Tracking/Visual Pursuits: Decreased smoothness of vertical tracking;Decreased smoothness of horizontal tracking Additional Comments: pt with noted R eye dysconjugate gaze, pt reports this is baseline from childhood. when pt more alert/attentive pt able to sustain both eyes at midline     Perception     Praxis      Pertinent Vitals/Pain Pain Assessment: No/denies pain     Hand Dominance Right   Extremity/Trunk Assessment Upper Extremity Assessment Upper Extremity Assessment: RUE deficits/detail;Generalized weakness RUE Deficits /  Details: increased weakness in RUE vs LUE due to previous CVA, tremors noted, endorses tingling in RUE RUE Coordination: decreased fine motor;decreased gross motor   Lower Extremity Assessment Lower Extremity Assessment: Defer to PT evaluation   Cervical / Trunk Assessment Cervical / Trunk Assessment: Other exceptions Cervical / Trunk Exceptions: obese    Communication Communication Communication: No difficulties   Cognition Arousal/Alertness: Lethargic Behavior During Therapy: WFL for tasks assessed/performed Overall Cognitive Status: Within Functional Limits for tasks assessed                                 General Comments: pt sleepy this session, less engaged than previous   General Comments       Exercises     Shoulder Instructions      Home Living Family/patient expects to be discharged to:: Other (Comment)(long-term care)                                 Additional Comments: Patient reports that she has been bed bound for a year, does not sit at the edge of bed, roll over , stand or walk or sit in a wc (reports her insurance won't pay for a w/c)      Prior Functioning/Environment Level of Independence: Needs assistance  Gait / Transfers Assistance Needed: bedbound x 1 yr; is able to assist with bed mobility ADL's / Homemaking Assistance Needed: total assist for bathing; can feed herself "but it takes a long time because of the stroke on my right side"            OT Problem List: Decreased strength;Decreased range of motion;Decreased activity tolerance;Impaired balance (sitting and/or standing);Cardiopulmonary status limiting activity;Obesity;Impaired UE functional use      OT Treatment/Interventions:      OT Goals(Current goals can be found in the care plan section) Acute Rehab OT Goals Patient Stated Goal: none stated, agreeable to working with therapies OT Goal Formulation: All assessment and education complete, DC therapy  OT Frequency:     Barriers to D/C:            Co-evaluation              AM-PAC OT "6 Clicks" Daily Activity     Outcome Measure Help from another person eating meals?: A Lot Help from another person taking care of personal grooming?: A Lot Help from another person toileting, which includes using toliet, bedpan, or urinal?: Total Help from another  person bathing (including washing, rinsing, drying)?: Total Help from another person to put on and taking off regular upper body clothing?: A Lot Help from another person to put on and taking off regular lower body clothing?: Total 6 Click Score: 9   End of Session Nurse Communication: Mobility status  Activity Tolerance: Patient limited by lethargy Patient left: in bed;with call bell/phone within reach  OT Visit Diagnosis: Muscle weakness (generalized) (M62.81)                Time: [id number] OT Time Calculation (min): 15 min Charges:  OT General Charges $OT Visit: 1 Visit OT Evaluation $OT Eval Moderate Complexity: 1 Mod  Lou Cal, [place] Pager [phone] Office [phone]   Raymondo Band 12/25/2018, 1:55 PM

## 2018-12-25 NOTE — Progress Notes (Signed)
ANTICOAGULATION CONSULT NOTE  Pharmacy Consult for Heparin> Apixaban > Lovenox Indication: pulmonary embolus (PTA apixaban)  Allergies  Allergen Reactions  . Bee Venom Swelling  . Enalapril Maleate Swelling    Patient Measurements: Wt: 102.5 kg Height: 5\' 3"  (160 cm) IBW/kg (Calculated) : 52.4   Vital Signs: Temp: 98.4 F (36.9 C) (11/11 0730) Temp Source: Oral (11/11 0730) BP: 129/84 (11/11 0730) Pulse Rate: 76 (11/11 0730)  Labs: Recent Labs    12/22/18 0940 12/23/18 0410 12/24/18 0508 12/24/18 0900 12/25/18 0110  HGB 8.4* 8.5* 8.3* 9.5* 8.1*  HCT 26.6* 26.7* 26.6* 28.0* 25.7*  PLT 320 322 343  --  305  APTT 142*  --   --   --   --   HEPARINUNFRC 1.04*  --   --   --   --   CREATININE 1.42* 1.55* 1.64*  --  1.51*    Estimated Creatinine Clearance: 35.7 mL/min (A) (by C-G formula based on SCr of 1.51 mg/dL (H)).   Medications:  Infusions:  . levETIRAcetam      Assessment: 3 yoF admitted on 11/7 with COVID-19 pneumonia, hemoptysis, epistaxis.  PMH is significant for recent PE in October, 2020 on apixaban prior to admission.  Pharmacy initially consulted to dose IV Heparin inpatient while holding apixaban for bleeding then apixaban was resumed. Pharmacy now consulted for Lovenox. 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   CBC: Hgb remains low/stable at 8.1 and Plt remain WNL at 305k. SCr 1.51 with CrCl ~ 36 ml/min Bleeding:  No further epistaxis reported. Last dose of apixaban was last PM   Goal of Therapy:  Anti-Xa level 0.6-1 units/ml 4hrs after LMWH dose given Monitor platelets by anticoagulation protocol: Yes   Plan:  Initiate Lovenox 1 mg/kg q 12 hours.  Anti-Xa at steady-state due to acute on chronic renal insufficiency Monitor CBC, s/s of bleeding  Ulice Dash, PharmD, BCPS Clinical Pharmacist  12/25/2018 7:57 AM

## 2018-12-26 ENCOUNTER — Inpatient Hospital Stay (HOSPITAL_COMMUNITY): Payer: Medicare HMO

## 2018-12-26 LAB — GLUCOSE, CAPILLARY
Glucose-Capillary: 103 mg/dL — ABNORMAL HIGH (ref 70–99)
Glucose-Capillary: 113 mg/dL — ABNORMAL HIGH (ref 70–99)
Glucose-Capillary: 99 mg/dL (ref 70–99)
Glucose-Capillary: 117 mg/dL — ABNORMAL HIGH (ref 70–99)

## 2018-12-26 LAB — CBC WITH DIFFERENTIAL/PLATELET
Abs Immature Granulocytes: 0.03 10*3/uL (ref 0.00–0.07)
Basophils Absolute: 0 10*3/uL (ref 0.0–0.1)
Basophils Relative: 0 %
Eosinophils Absolute: 0 10*3/uL (ref 0.0–0.5)
Eosinophils Relative: 0 %
HCT: 26.7 % — ABNORMAL LOW (ref 36.0–46.0)
Hemoglobin: 8.4 g/dL — ABNORMAL LOW (ref 12.0–15.0)
Immature Granulocytes: 0 %
Lymphocytes Relative: 25 %
Lymphs Abs: 1.9 10*3/uL (ref 0.7–4.0)
MCH: 26.9 pg (ref 26.0–34.0)
MCHC: 31.5 g/dL (ref 30.0–36.0)
MCV: 85.6 fL (ref 80.0–100.0)
Monocytes Absolute: 0.8 10*3/uL (ref 0.1–1.0)
Monocytes Relative: 10 %
Neutro Abs: 4.9 10*3/uL (ref 1.7–7.7)
Neutrophils Relative %: 65 %
Platelets: 353 10*3/uL (ref 150–400)
RBC: 3.12 MIL/uL — ABNORMAL LOW (ref 3.87–5.11)
RDW: 20.6 % — ABNORMAL HIGH (ref 11.5–15.5)
WBC: 7.6 10*3/uL (ref 4.0–10.5)
nRBC: 0.9 % — ABNORMAL HIGH (ref 0.0–0.2)

## 2018-12-26 LAB — COMPREHENSIVE METABOLIC PANEL
ALT: 30 U/L (ref 0–44)
AST: 43 U/L — ABNORMAL HIGH (ref 15–41)
Albumin: 2.1 g/dL — ABNORMAL LOW (ref 3.5–5.0)
Alkaline Phosphatase: 161 U/L — ABNORMAL HIGH (ref 38–126)
Anion gap: 8 (ref 5–15)
BUN: 30 mg/dL — ABNORMAL HIGH (ref 8–23)
CO2: 20 mmol/L — ABNORMAL LOW (ref 22–32)
Calcium: 7.6 mg/dL — ABNORMAL LOW (ref 8.9–10.3)
Chloride: 105 mmol/L (ref 98–111)
Creatinine, Ser: 1.58 mg/dL — ABNORMAL HIGH (ref 0.44–1.00)
GFR calc Af Amer: 36 mL/min — ABNORMAL LOW (ref >60)
GFR calc non Af Amer: 31 mL/min — ABNORMAL LOW (ref >60)
Glucose, Bld: 102 mg/dL — ABNORMAL HIGH (ref 70–99)
Potassium: 3.5 mmol/L (ref 3.5–5.1)
Sodium: 133 mmol/L — ABNORMAL LOW (ref 135–145)
Total Bilirubin: 0.4 mg/dL (ref 0.3–1.2)
Total Protein: 5.9 g/dL — ABNORMAL LOW (ref 6.5–8.1)

## 2018-12-26 LAB — MAGNESIUM: Magnesium: 1.9 mg/dL (ref 1.7–2.4)

## 2018-12-26 LAB — BRAIN NATRIURETIC PEPTIDE: B Natriuretic Peptide: 58.8 pg/mL (ref 0.0–100.0)

## 2018-12-26 LAB — C-REACTIVE PROTEIN: CRP: 1.7 mg/dL — ABNORMAL HIGH (ref <1.0)

## 2018-12-26 LAB — D-DIMER, QUANTITATIVE: D-Dimer, Quant: 5.07 ug/mL-FEU — ABNORMAL HIGH (ref 0.00–0.50)

## 2018-12-26 LAB — PROCALCITONIN: Procalcitonin: <0.1 ng/mL

## 2018-12-26 MED ORDER — POTASSIUM CHLORIDE 2 MEQ/ML IV SOLN
INTRAVENOUS | Status: DC
  Administered 2018-12-26 – 2018-12-27 (×2): via INTRAVENOUS
  Filled 2018-12-26 (×3): qty 1000

## 2018-12-26 MED ORDER — SODIUM CHLORIDE 0.9 % IV SOLN
3.0000 g | Freq: Three times a day (TID) | INTRAVENOUS | Status: DC
  Administered 2018-12-26 – 2018-12-30 (×12): 3 g via INTRAVENOUS
  Filled 2018-12-26: qty 8
  Filled 2018-12-26: qty 3
  Filled 2018-12-26 (×2): qty 8
  Filled 2018-12-26: qty 3
  Filled 2018-12-26 (×3): qty 8
  Filled 2018-12-26 (×3): qty 3
  Filled 2018-12-26: qty 8
  Filled 2018-12-26 (×2): qty 3
  Filled 2018-12-26: qty 8

## 2018-12-26 MED ORDER — LEVETIRACETAM IN NACL 1000 MG/100ML IV SOLN
1000.0000 mg | INTRAVENOUS | Status: DC
  Administered 2018-12-26 – 2018-12-30 (×5): 1000 mg via INTRAVENOUS
  Filled 2018-12-26 (×6): qty 100

## 2018-12-26 MED ORDER — APIXABAN 5 MG PO TABS
5.0000 mg | ORAL_TABLET | Freq: Two times a day (BID) | ORAL | Status: DC
  Administered 2018-12-27 – 2018-12-31 (×9): 5 mg via ORAL
  Filled 2018-12-26 (×10): qty 1

## 2018-12-26 MED ORDER — LORAZEPAM 2 MG/ML IJ SOLN: 1.0000 mg | INTRAMUSCULAR | Status: DC | PRN

## 2018-12-26 MED ORDER — LACTATED RINGERS IV SOLN: INTRAVENOUS | Status: DC

## 2018-12-26 MED ORDER — ALBUTEROL SULFATE HFA 108 (90 BASE) MCG/ACT IN AERS
2.0000 | INHALATION_SPRAY | Freq: Four times a day (QID) | RESPIRATORY_TRACT | Status: DC
  Administered 2018-12-27 – 2018-12-31 (×17): 2 via RESPIRATORY_TRACT
  Filled 2018-12-26: qty 6.7

## 2018-12-26 NOTE — Progress Notes (Signed)
Pharmacy Antibiotic Note  Katherine [name] is a 78 y.o. female admitted on 12/21/2018 with Aspiration PNA.  Pharmacy has been consulted for Unasyn dosing.  Plan: Ampicillin-sulbactam 3gm IV q8h Monitor renal function, clinical course, and LOT  Height: 5\' 3"  (160 cm) IBW/kg (Calculated) : 52.4  Temp (24hrs), Avg:98.5 F (36.9 C), Min:97 F (36.1 C), Max:99.5 F (37.5 C)  Recent Labs  Lab 12/19/18 2125 12/20/18 0009  12/22/18 0940 12/23/18 0410 12/24/18 0508 12/25/18 0110 12/26/18 0105  WBC 2.9*  --    < > 2.6* 2.3* 2.6* 4.7 7.6  CREATININE 1.45*  --    < > 1.42* 1.55* 1.64* 1.51* 1.58*  LATICACIDVEN 1.0 0.7  --   --   --   --   --   --    < > = values in this interval not displayed.    Estimated Creatinine Clearance: 34.1 mL/min (A) (by C-G formula based on SCr of 1.58 mg/dL (H)).    Allergies  Allergen Reactions  . Bee Venom Swelling  . Enalapril Maleate Swelling    Antimicrobials this admission: 11/12 Unasyn >>     Joneisha Miles A. Levada Dy, PharmD, BCPS, FNKF Clinical Pharmacist [place] Please utilize Amion for appropriate phone number to reach the unit pharmacist ([place])   12/26/2018 9:29 AM

## 2018-12-26 NOTE — TOC Initial Note (Signed)
Transition of Care [facility]) - Initial/Assessment Note    Patient Details  Name: Katherine [name] MRN: [id number] Date of Birth: May 27, 1940  Transition of Care [facility]) CM/SW Contact:    Carles Collet, RN Phone Number: 12/26/2018, 10:34 AM  Clinical Narrative:                 Confirmed with spouse that he would like patient to return to [facility] when medically stable. Confirmed with Claiborne Billings from [facility] that patient is a long term care resident. They will have bed available for her anytime she is discharged. She will NOT need a covid test to return. FL2 started, will fax to [facility] today.    Expected Discharge Plan: Skilled Nursing Facility Barriers to Discharge: Continued Medical Work up   Patient Goals and CMS Choice Patient states their goals for this hospitalization and ongoing recovery are:: spoke to spouse who would like patient to return to SNF when able to do so CMS Medicare.gov Compare Post Acute Care list provided to:: Other (Comment Required) Choice offered to / list presented to : Spouse  Expected Discharge Plan and Services Expected Discharge Plan: [place] Choice: [place] Living arrangements for the past 2 months: [place]                                      Prior Living Arrangements/Services Living arrangements for the past 2 months: [place]                     Activities of Daily Living Home Assistive Devices/Equipment: None(from SNF) ADL Screening (condition at time of admission) Patient's cognitive ability adequate to safely complete daily activities?: Yes Is the patient deaf or have difficulty hearing?: No Does the patient have difficulty seeing, even when wearing glasses/contacts?: No Does the patient have difficulty concentrating, remembering, or making decisions?: No Patient able to express need for assistance with ADLs?:  Yes Does the patient have difficulty dressing or bathing?: Yes Independently performs ADLs?: No Communication: Independent Dressing (OT): Needs assistance Is this a change from baseline?: Pre-admission baseline Grooming: Needs assistance Is this a change from baseline?: Pre-admission baseline Feeding: Needs assistance Is this a change from baseline?: Pre-admission baseline Bathing: Needs assistance Is this a change from baseline?: Pre-admission baseline Toileting: Needs assistance Is this a change from baseline?: Pre-admission baseline In/Out Bed: Needs assistance Is this a change from baseline?: Pre-admission baseline Walks in Home: Needs assistance Is this a change from baseline?: Pre-admission baseline Does the patient have difficulty walking or climbing stairs?: Yes Weakness of Legs: Right Weakness of Arms/Hands: Right  Permission Sought/Granted                  Emotional Assessment              Admission diagnosis:  COVID 19 Patient Active Problem List   Diagnosis Date Noted  . COVID-19 12/21/2018  . COVID-19 virus infection 12/21/2018  . Hemoptysis 12/20/2018  . Pulmonary embolism ([place]) 11/21/2018  . Delirium   . UTI (urinary tract infection) 08/21/2018  . Chest pain 07/15/2018  . Palliative care encounter 05/10/2018  . Localized edema 05/10/2018  . Shortness of breath 05/10/2018  . Hematemesis 04/28/2018  . Influenza A 04/13/2018  . Acute on chronic congestive heart failure ([place])   . COPD  with acute exacerbation ([place]) 02/24/2018  . Chronic diastolic heart failure ([place]) 12/31/2017  . Lymphedema 12/31/2017  . COPD exacerbation ([place]) 11/30/2017  . Urinary tract infection 11/22/2017  . 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[place]) 11/16/2017  . Diabetes mellitus type 2, uncomplicated ([place]) 0000000  . Pressure injury of skin 10/29/2017  . Acute kidney injury superimposed on CKD ([place]) 10/24/2017  . Severe sepsis ([place]) 10/24/2017  . Possible Seizures  ([place]) 10/08/2017  . Hyperlipemia 10/08/2017  . 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 Stroke-like episode ([place]) s/p IV tpa 10/04/2017  . Palliative care by specialist   . Elevated rheumatoid factor 09/05/2017  . Frequent hospital admissions 08/22/2017  . Aphasia 06/28/2017  . Hand pain 03/21/2017  . Dry skin 03/21/2017  . Pain in finger of left hand 09/12/2016  . Chronic fatigue 06/12/2016  . Left knee pain 06/12/2016  . Goals of care, counseling/discussion 03/13/2016  . Primary localized osteoarthritis of right knee 02/08/2016  . OSA (obstructive sleep apnea) 09/16/2015  . Rotator cuff syndrome 09/07/2015  . 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 Sleep disturbance 04/14/2015  . Coronary artery disease 03/14/2015  . 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 Acute respiratory failure with hypoxia ([place]) 09/16/2014  . 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 Anxiety   . Tobacco abuse   . Prurigo nodularis   . GERD (gastroesophageal reflux disease)   . COPD (chronic obstructive pulmonary disease) ([place])   . Osteoarthritis   . Vitamin D deficiency disease   . Chronic constipation   . CKD (chronic kidney disease), stage III   . Essential hypertension 05/20/2013  . Morbid obesity ([place]) 05/20/2013   PCP:  Valerie Roys, DO Pharmacy:   [facility], [address] [place]. [place] [address] Phone: [phone] Fax: [phone]     Social Determinants of Health (SDOH) Interventions    Readmission Risk Interventions Readmission Risk Prevention Plan 11/26/2018 11/22/2018 07/16/2018  Transportation Screening Complete Complete Complete  Medication Review Press photographer) Complete Complete Complete  PCP or Specialist appointment within 3-5 days of discharge (No Data) - Complete  PCP/Specialist Appt Not Complete comments - - -  HRI or [place] (No Data) - Complete  SW Recovery Care/Counseling Consult Complete - Not  Complete  SW Consult Not Complete Comments - - na  Palliative Care Screening - - Not Applicable  Medication Reconcilation ([place] Complete Complete Complete  Some recent data might be hidden

## 2018-12-26 NOTE — NC FL2 (Signed)
[place] LEVEL OF CARE SCREENING TOOL     IDENTIFICATION  Patient Name: Katherine [name] Birthdate: 1940-11-10 Sex: female Admission Date (Current Location): 12/21/2018  [facility] [place] Number:  Herbalist and Address:  [facility] [place] [place] [address] [place], [place])      Provider Number: [id number]  Attending Physician Name and Address:  Thurnell Lose, MD  Relative Name and Phone Number:       Current Level of Care: Hospital Recommended Level of Care: [place] Prior Approval Number:    Date Approved/Denied: 02/08/16 PASRR Number: [id number]  Discharge Plan: SNF    Current Diagnoses: Patient Active Problem List   Diagnosis Date Noted  . COVID-19 12/21/2018  . COVID-19 virus infection 12/21/2018  . Hemoptysis 12/20/2018  . Pulmonary embolism ([place]) 11/21/2018  . Delirium   . UTI (urinary tract infection) 08/21/2018  . Chest pain 07/15/2018  . Palliative care encounter 05/10/2018  . Localized edema 05/10/2018  . Shortness of breath 05/10/2018  . Hematemesis 04/28/2018  . Influenza A 04/13/2018  . Acute on chronic congestive heart failure ([place])   . COPD with acute exacerbation ([place]) 02/24/2018  . Chronic diastolic heart failure ([place]) 12/31/2017  . Lymphedema 12/31/2017  . COPD exacerbation ([place]) 11/30/2017  . Urinary tract infection 11/22/2017  . 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[place]) 11/16/2017  . Diabetes mellitus type 2, uncomplicated ([place]) 0000000  . Pressure injury of skin 10/29/2017  . Acute kidney injury superimposed on CKD ([place]) 10/24/2017  . Severe sepsis ([place]) 10/24/2017  . Possible Seizures ([place]) 10/08/2017  . Hyperlipemia 10/08/2017  . 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 Stroke-like episode ([place]) s/p IV tpa 10/04/2017  . Palliative care by specialist   . Elevated rheumatoid factor 09/05/2017  . Frequent hospital  admissions 08/22/2017  . Aphasia 06/28/2017  . Hand pain 03/21/2017  . Dry skin 03/21/2017  . Pain in finger of left hand 09/12/2016  . Chronic fatigue 06/12/2016  . Left knee pain 06/12/2016  . Goals of care, counseling/discussion 03/13/2016  . Primary localized osteoarthritis of right knee 02/08/2016  . OSA (obstructive sleep apnea) 09/16/2015  . Rotator cuff syndrome 09/07/2015  . 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 Sleep disturbance 04/14/2015  . Coronary artery disease 03/14/2015  . 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 Acute respiratory failure with hypoxia ([place]) 09/16/2014  . 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 Anxiety   . Tobacco abuse   . Prurigo nodularis   . GERD (gastroesophageal reflux disease)   . COPD (chronic obstructive pulmonary disease) ([place])   . Osteoarthritis   . Vitamin D deficiency disease   . Chronic constipation   . CKD (chronic kidney disease), stage III   . Essential hypertension 05/20/2013  . Morbid obesity ([place]) 05/20/2013    Orientation RESPIRATION BLADDER Height & Weight     Self, Situation, Place  Normal Incontinent, External catheter Weight:   Height:  5\' 3"  (160 cm)  BEHAVIORAL SYMPTOMS/MOOD NEUROLOGICAL BOWEL NUTRITION STATUS      Incontinent Diet(See DC Summary)  AMBULATORY STATUS COMMUNICATION OF NEEDS Skin   Extensive Assist Verbally PU Stage and Appropriate Care(Right Buttocks)   PU Stage 2 Dressing: (every three days)                   Personal Care Assistance Level of Assistance  Bathing, Feeding, Dressing Bathing Assistance: Maximum assistance Feeding assistance: Maximum assistance Dressing Assistance: Maximum  assistance     Functional Limitations Info  Sight, Hearing, Speech Sight Info: Adequate Hearing Info: Adequate Speech Info: Adequate    SPECIAL CARE FACTORS FREQUENCY  PT (By licensed PT), OT (By licensed OT)     PT Frequency: 5x a week OT Frequency: 5x a week            Contractures Contractures Info:  Not present    Additional Factors Info  Allergies Code Status Info: Full Allergies Info: bee venom elalpril, See DC summary for full final list of allergies and medications           Current Medications (12/26/2018):  This is the current hospital active medication list Current Facility-Administered Medications  Medication Dose Route Frequency Provider Last Rate Last Dose  . acetaminophen (TYLENOL) tablet 650 mg  650 mg Oral Q6H PRN Thurnell Lose, MD   650 mg at 12/22/18 2238  . Ampicillin-Sulbactam (UNASYN) 3 g in sodium chloride 0.9 % 100 mL IVPB  3 g Intravenous Q8H Pierce, Dwayne A, RPH      . atorvastatin (LIPITOR) tablet 80 mg  80 mg Oral QPM Thurnell Lose, MD   Stopped at 12/24/18 1801  . cholecalciferol (VITAMIN D3) tablet 1,000 Units  1,000 Units Oral Daily Thurnell Lose, MD   1,000 Units at 12/25/18 0859  . dexamethasone (DECADRON) injection 2 mg  2 mg Intravenous Q24H Thurnell Lose, MD   2 mg at 12/24/18 1613  . docusate sodium (COLACE) capsule 100 mg  100 mg Oral BID Thurnell Lose, MD   100 mg at 12/25/18 2133  . enoxaparin (LOVENOX) injection 100 mg  100 mg Subcutaneous Q12H Lala Lund K, MD   100 mg at 12/26/18 0911  . famotidine (PEPCID) tablet 40 mg  40 mg Oral Daily Thurnell Lose, MD   40 mg at 12/25/18 0857  . gabapentin (NEURONTIN) capsule 300 mg  300 mg Oral QHS Thurnell Lose, MD   300 mg at 12/25/18 2133  . hydrALAZINE (APRESOLINE) tablet 25 mg  25 mg Oral TID Thurnell Lose, MD   25 mg at 12/25/18 2137  . insulin aspart (novoLOG) injection 0-5 Units  0-5 Units Subcutaneous QHS Lala Lund K, MD      . insulin aspart (novoLOG) injection 0-9 Units  0-9 Units Subcutaneous TID WC Thurnell Lose, MD   Stopped at 12/24/18 1005  . lactated ringers 1,000 mL with potassium chloride 40 mEq infusion   Intravenous Continuous Thurnell Lose, MD 75 mL/hr at 12/26/18 1037    . levETIRAcetam (KEPPRA) IVPB 1000 mg/100 mL premix  1,000 mg  Intravenous Q24H Lala Lund K, MD 400 mL/hr at 12/26/18 1045 1,000 mg at 12/26/18 1045  . linagliptin (TRADJENTA) tablet 5 mg  5 mg Oral Daily Thurnell Lose, MD   5 mg at 12/25/18 0906  . LORazepam (ATIVAN) injection 1 mg  1 mg Intravenous Q4H PRN Thurnell Lose, MD      . multivitamin with minerals tablet 1 tablet  1 tablet Oral Daily Thurnell Lose, MD   1 tablet at 12/25/18 0858  . nitroGLYCERIN (NITROSTAT) SL tablet 0.4 mg  0.4 mg Sublingual Q5 min PRN Thurnell Lose, MD      . ondansetron [facility]) injection 4 mg  4 mg Intravenous Q6H PRN Thurnell Lose, MD      . vitamin B-12 (CYANOCOBALAMIN) tablet 1,000 mcg  1,000 mcg Oral Daily Thurnell Lose, MD   1,000  mcg at 12/25/18 0900     Discharge Medications: Please see discharge summary for a list of discharge medications.  Relevant Imaging Results:  Relevant Lab Results:   Additional Information SS# [id number]  Carles Collet, RN

## 2018-12-26 NOTE — Progress Notes (Signed)
Called in by Candiss Norse to patient room. Patient gurgling secretions. Suctioned but also vomited during suction. Placed on the side and commenced oxygen at 6L [place]. Vitals done. Patient is responsive and answering questions. I will continue to monitor

## 2018-12-26 NOTE — Progress Notes (Signed)
PT Cancellation Note  Patient Details Name: Katherine [name] MRN: [id number] DOB: [birth year]   Cancelled Treatment:    Reason Eval/Treat Not Completed: Pt evaluated by PT/OT (see notes 12/23/18 & 12/25/18) with no acute needs identified. Pt is long-term care resident at SNF, bedbound and not receiving therapies. Will require maximove for OOB transfers with nursing staff as pt can tolerate. Acute PT will sign off.  Mabeline Caras, PT, DPT Acute Rehabilitation Services  Pager [phone] Office [place] 12/26/2018, 7:43 AM

## 2018-12-26 NOTE — Progress Notes (Signed)
Pt. Transferred to room 151. RN Elder Negus  took over care of the patient.

## 2018-12-26 NOTE — Progress Notes (Signed)
Family facetime patient. No further concerns

## 2018-12-26 NOTE — Progress Notes (Addendum)
PROGRESS NOTE                                                                                                                                                                                                             Patient Demographics:    Katherine [name], is a 78 y.o. female, DOB - [birth year], NKN:[id number]  Outpatient Primary MD for the patient is Valerie Roys, DO    LOS - 5  Admit date - 12/21/2018    Chief complaint.  Nosebleed.     Brief Narrative -  Katherine [name]  is a 78 y.o. female, with history of recent PE diagnosed November 21, 2018 currently on Eliquis, generalized weakness and deconditioning, virtually bedbound and nursing home resident for several months, CKD 3, DM type II, hypertension, COPD, GERD, seizures, obesity, CVA who lives in a nursing home and initially presented to [facility] ER with chief complaints of nosebleeding.  She was found to have epistaxis and it was treated.  Subsequently testing suggested that she had COVID-19 infection and sent to [facility] for further care.  Patient is currently relatively symptom-free.  Denies any headache, no chest pain shortness of breath.  No productive cough.  No focal weakness which is new.   Subjective:   Patient in bed, had a mild headache and was gurgling with vomitus in her mouth, she was immediately suctioned and placed on oxygen, has been since moved closer to the unit.  Was reevaluated again few hours later and she is feeling a whole lot better, denies any headache chest or abdominal pain, no shortness of breath at this time.    Assessment  & Plan :     1.  Acute Covid 19 Viral Pneumonitis during the ongoing 2020 Covid 19 Pandemic -mild disease currently on 1 L nasal cannula oxygen and can be titrated down to room air soon.  Continue IV steroids and remdesivir.  Monitor clinically.  Encouraged to sit up in chair and daytime use I-S and flutter valve for pulmonary  toiletry and prone in bed at night if possible.   COVID-19 Labs  Recent Labs    12/24/18 0508 12/25/18 0110 12/26/18 0105  DDIMER 6.71* 5.76* 5.07*  CRP 2.4* 1.7* 1.7*    Lab Results  Component Value Date   SARSCOV2NAA POSITIVE (A) 12/19/2018   SARSCOV2NAA NOT DETECTED 11/24/2018  [place] NEGATIVE 11/21/2018   [place] NEGATIVE 09/06/2018     SpO2: 98 % O2 Flow Rate (L/min): 4 L/min  Hepatic Function Latest Ref Rng & Units 12/26/2018 12/25/2018 12/24/2018  Total Protein 6.5 - 8.1 g/dL 5.9(L) 6.2(L) 6.6  Albumin 3.5 - 5.0 g/dL 2.1(L) 2.2(L) 2.3(L)  AST 15 - 41 U/L 43(H) 33 35  ALT 0 - 44 U/L 30 23 25   Alk Phosphatase 38 - 126 U/L 161(H) 165(H) 192(H)  Total Bilirubin 0.3 - 1.2 mg/dL 0.4 0.7 0.7        Component Value Date/Time   BNP 58.8 12/26/2018 0105    2.  Recent PE diagnosed October 2020.    Continue Eliquis.  Nosebleed seems to have resolved.  3.  History of ESBL UTI.  She was started on Invanz on 12/19/2018.  Currently no signs of active infection, will rule out C. difficile as an #8 below thereafter if signs of any active infection will add antibiotics appropriately.  4.  Leukopenia.  Likely due to viral illness.  5.  History of CVA.  Continue statin for secondary prevention along with anticoagulation.  6.  Weakness, deconditioning, bedbound status.  Supportive care discharge back to SNF once better.  7.  GERD.  Switch PPI to Pepcid, rule out C. difficile first.  8.  Gastroenteritis.  Likely Covid related likely C. difficile colonized, PCR negative.  Diarrhea improved will monitor.  9.  CKD 2.  Baseline creatinine 1.2 will monitor.   10.   History of seizures.  On Keppra.  11.   Episode of unresponsiveness  on 12/24/2018 morning.  Patient has had 2 similar episodes in the past, deemed to be due to seizures, she had thorough neurological work-up less than 1 year ago for 2 similar episodes and they were thought to be seizure related. She  was already on Eliquis hence not a candidate for TPA, last known time of normal exam unknown hence again not a candidate for TPA.  She returned to normal within a few hours of Keppra.  Head CT unremarkable, EEG nonspecific.  Case was discussed with neurologist Dr. Lorraine Lax multiple times.  Kindly see note from 12/24/2018.  Patient had another breakthrough seizure-like episode morning of 12/26/2018, she threw up and had a bowel movement.  Case was discussed with neurologist Dr. Lorraine Lax again on 12/26/2018, Keppra dose has been increased from 500 mg to 1 g.  Will monitor if another breakthrough seizure then will add Vimpat as well.   12.  Possible aspiration during second seizure episode on 12/26/2018 as she vomited.  Placed on Unasyn.  Supplemental oxygen and monitor.  Chest x-ray stable for now.  Trend procalcitonin.  13. Recent episodes of epistaxis prompting this admission at [facility].  Resolved.    14. DM type II.  Continue Tradjenta, placed on sliding scale here.    Lab Results  Component Value Date   HGBA1C 5.1 12/21/2018   CBG (last 3)  Recent Labs    12/25/18 1706 12/25/18 2131 12/26/18 0908  GLUCAP 105* 114* 103*     Condition - Fair  Family Communication  : Cussed with granddaughter in detail on 12/26/2018 explained guarded prognosis.    Discussed with husband in detail on 12/26/2018 he affirms that he is the primary decision maker, he understands and agrees that his wife's baseline quality of life is very poor requiring multiple hospital admissions this year, he agrees that she should be DNR but that medical treatment should be continued.  Code Status :  DNR  Diet :   Diet Order            DIET DYS 3 Room service appropriate? Yes; Fluid consistency: Thin  Diet effective now               Disposition Plan  :  SNF   Consults  :  None  Procedures  :     PUD Prophylaxis : pepcid  DVT Prophylaxis  :   Lovenox >> Eliquis  Lab Results  Component Value Date   PLT  353 12/26/2018    Inpatient Medications  Scheduled Meds:  atorvastatin  80 mg Oral QPM   cholecalciferol  1,000 Units Oral Daily   dexamethasone (DECADRON) injection  2 mg Intravenous Q24H   docusate sodium  100 mg Oral BID   enoxaparin (LOVENOX) injection  100 mg Subcutaneous Q12H   famotidine  40 mg Oral Daily   gabapentin  300 mg Oral QHS   hydrALAZINE  25 mg Oral TID   insulin aspart  0-5 Units Subcutaneous QHS   insulin aspart  0-9 Units Subcutaneous TID WC   linagliptin  5 mg Oral Daily   multivitamin with minerals  1 tablet Oral Daily   cyanocobalamin  1,000 mcg Oral Daily   Continuous Infusions:  ampicillin-sulbactam (UNASYN) IV     lactated ringers with kcl 75 mL/hr at 12/26/18 1037   levETIRAcetam 1,000 mg (12/26/18 1045)   PRN Meds:.acetaminophen, LORazepam, nitroGLYCERIN, [DISCONTINUED] ondansetron **OR** ondansetron (ZOFRAN) IV  Antibiotics  :    Anti-infectives (From admission, onward)   Start     Dose/Rate Route Frequency Ordered Stop   12/26/18 0945  Ampicillin-Sulbactam (UNASYN) 3 g in sodium chloride 0.9 % 100 mL IVPB     3 g 200 mL/hr over 30 Minutes Intravenous Every 8 hours 12/26/18 0932     12/21/18 1600  remdesivir 100 mg in sodium chloride 0.9 % 250 mL IVPB     100 mg 500 mL/hr over 30 Minutes Intravenous Every 24 hours 12/21/18 1454 12/24/18 1631       Time Spent in minutes  30   Lala Lund M.D on 12/26/2018 at 11:46 AM  To page go to [email or web address] - password [facility]  [facility] -  Office  [phone]    See all Orders from today for further details    Objective:   Vitals:   12/26/18 0337 12/26/18 0427 12/26/18 0900 12/26/18 1000  BP:  107/60 104/75 92/72  Pulse: 88 91 (!) 119 (!) 110  Resp: 15 (!) 22 (!) 27 20  Temp:  98.9 F (37.2 C) 99.5 F (37.5 C) 97.9 F (36.6 C)  TempSrc:  Oral Oral Oral  SpO2: 95% 93% 96% 98%  Height:        Wt Readings from Last 3 Encounters:  12/19/18 102.5 kg  11/21/18  104 kg  11/01/18 104.1 kg     Intake/Output Summary (Last 24 hours) at 12/26/2018 1146 Last data filed at 12/26/2018 0909 Gross per 24 hour  Intake 100 ml  Output 800 ml  Net -700 ml     Physical Exam  Awake , had vomitus in her mouth early in the morning and was gurgling, immediately suctioned, chronic right-sided deviation of both eyes right more than left, 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    [place].AT,  Supple Neck,No JVD, No cervical lymphadenopathy appriciated.  Symmetrical Chest wall movement, Good air movement bilaterally, CTAB RRR,No Gallops, Rubs  or new Murmurs, No Parasternal Heave +ve B.Sounds, Abd Soft, No tenderness, No organomegaly appriciated, No rebound - guarding or rigidity. No Cyanosis, Clubbing or edema, No new Rash or bruise      Data Review:    CBC Recent Labs  Lab 12/22/18 0940 12/23/18 0410 12/24/18 0508 12/24/18 0900 12/25/18 0110 12/26/18 0105  WBC 2.6* 2.3* 2.6*  --  4.7 7.6  HGB 8.4* 8.5* 8.3* 9.5* 8.1* 8.4*  HCT 26.6* 26.7* 26.6* 28.0* 25.7* 26.7*  PLT 320 322 343  --  305 353  MCV 84.7 84.5 85.3  --  84.5 85.6  MCH 26.8 26.9 26.6  --  26.6 26.9  MCHC 31.6 31.8 31.2  --  31.5 31.5  RDW 19.7* 19.8* 20.0*  --  20.3* 20.6*  LYMPHSABS 0.8 0.6* 0.7  --  1.2 1.9  MONOABS 0.2 0.1 0.3  --  0.5 0.8  EOSABS 0.0 0.0 0.0  --  0.0 0.0  BASOSABS 0.0 0.0 0.0  --  0.0 0.0    Chemistries  Recent Labs  Lab 12/22/18 0940 12/23/18 0410 12/24/18 0508 12/24/18 0900 12/25/18 0110 12/26/18 0105  NA 133* 134* 136 136 137 133*  K 4.1 4.2 4.3 4.1 4.0 3.5  CL 103 104 106  --  108 105  CO2 22 22 22   --  22 20*  GLUCOSE 114* 124* 117*  --  94 102*  BUN 20 26* 30*  --  32* 30*  CREATININE 1.42* 1.55* 1.64*  --  1.51* 1.58*  CALCIUM 8.3* 8.2* 8.3*  --  8.0* 7.6*  MG 1.6* 1.8 1.8  --  1.9 1.9  AST 58* 43* 35  --  33 43*  ALT 30 28 25   --  23 30  ALKPHOS 227* 205* 192*  --  165* 161*    BILITOT 0.5 0.4 0.7  --  0.7 0.4   ------------------------------------------------------------------------------------------------------------------ No results for input(s): CHOL, HDL, LDLCALC, TRIG, CHOLHDL, LDLDIRECT in the last 72 hours.  Lab Results  Component Value Date   HGBA1C 5.1 12/21/2018   ------------------------------------------------------------------------------------------------------------------ No results for input(s): TSH, T4TOTAL, T3FREE, THYROIDAB in the last 72 hours.  Invalid input(s): FREET3  Cardiac Enzymes No results for input(s): CKMB, TROPONINI, MYOGLOBIN in the last 168 hours.  Invalid input(s): CK ------------------------------------------------------------------------------------------------------------------    Component Value Date/Time   BNP 58.8 12/26/2018 0105    Micro Results Recent Results (from the past 240 hour(s))  SARS CORONAVIRUS 2 (TAT 6-24 HRS) Nasopharyngeal Nasopharyngeal Swab     Status: Abnormal   Collection Time: 12/19/18 11:24 PM   Specimen: Nasopharyngeal Swab  Result Value Ref Range Status   SARS Coronavirus 2 POSITIVE (A) NEGATIVE Final    Comment: RESULT CALLED TO, READ BACK BY AND VERIFIED WITH: Henderson Newcomer RN 15:20 12/20/18 ([email or web address]) (NOTE) SARS-CoV-2 target nucleic acids are DETECTED. The SARS-CoV-2 RNA is generally detectable in upper and lower respiratory specimens during the acute phase of infection. Positive results are indicative of active infection with SARS-CoV-2. Clinical  correlation with patient history and other diagnostic information is necessary to determine patient infection status. Positive results do  not rule out bacterial infection or co-infection with other viruses. The expected result is Negative. Fact Sheet for Patients: SugarRoll.be Fact Sheet for Healthcare Providers: https://www.woods-mathews.com/ This test is not yet approved or cleared by the [place] FDA and  has been authorized for detection and/or diagnosis of SARS-CoV-2 by FDA under an Emergency Use Authorization (EUA). This EUA will remain  in effect (  meaning this test can be used) for  the duration of the COVID-19 declaration under Section 564(b)(1) of the Act, 21 U.S.C. section 360bbb-3(b)(1), unless the authorization is terminated or revoked sooner. Performed at [facility], [place] [address] [place], [place] [address]   MRSA PCR Screening     Status: None   Collection Time: 12/21/18  8:15 AM   Specimen: Nasal Mucosa; Nasopharyngeal  Result Value Ref Range Status   MRSA by PCR NEGATIVE NEGATIVE Final    Comment:        The GeneXpert MRSA Assay (FDA approved for NASAL specimens only), is one component of a comprehensive MRSA colonization surveillance program. It is not intended to diagnose MRSA infection nor to guide or monitor treatment for MRSA infections. Performed at [facility], [address] [place], [place] [address]   C difficile quick scan w PCR reflex     Status: Abnormal   Collection Time: 12/21/18  3:26 PM   Specimen: STOOL  Result Value Ref Range Status   C Diff antigen POSITIVE (A) NEGATIVE Final   C Diff toxin NEGATIVE NEGATIVE Final   C Diff interpretation Results are indeterminate. See PCR results.  Final    Comment: Performed at [facility], [place] [address], [place], [place] [address]  C. Diff by PCR, Reflexed     Status: None   Collection Time: 12/21/18  3:26 PM  Result Value Ref Range Status   Toxigenic C. Difficile by PCR NEGATIVE NEGATIVE Final    Comment: Patient is colonized with non toxigenic C. difficile. May not need treatment unless significant symptoms are present. Performed at [facility], [place] [address], [place], [place] [address]     Radiology Reports Dg Chest 2 View  Result Date: 12/19/2018 CLINICAL DATA:  Hemoptysis, cough EXAM: CHEST - 2 VIEW COMPARISON:  11/24/2018  FINDINGS: Increased markings in the lower lungs, likely scarring, similar to prior study. Heart is borderline in size. Aortic atherosclerosis. No acute airspace opacities or effusions. No acute bony abnormality. IMPRESSION: Bibasilar linear densities, likely scarring. No definite acute process. Electronically Signed   By: Rolm Baptise M.D.   On: 12/19/2018 20:59   Ct Head Wo Contrast  Result Date: 12/24/2018 CLINICAL DATA:  Altered level of consciousness EXAM: CT HEAD WITHOUT CONTRAST TECHNIQUE: Contiguous axial images were obtained from the base of the skull through the vertex without intravenous contrast. COMPARISON:  12/19/2018 FINDINGS: Brain: No evidence of acute infarction, hemorrhage, hydrocephalus, extra-axial collection or mass lesion/mass effect. Mild periventricular white matter hypodensity. Vascular: No hyperdense vessel or unexpected calcification. Skull: Normal. Negative for fracture or focal lesion. Sinuses/Orbits: No acute finding. Other: None. IMPRESSION: No acute intracranial pathology.  Small-vessel white matter disease. Electronically Signed   By: Eddie Candle M.D.   On: 12/24/2018 09:04   Ct Head Wo Contrast  Result Date: 12/19/2018 CLINICAL DATA:  Headache, on Eliquis EXAM: CT HEAD WITHOUT CONTRAST TECHNIQUE: Contiguous axial images were obtained from the base of the skull through the vertex without intravenous contrast. COMPARISON:  09/06/2018 FINDINGS: Brain: Patchy chronic small vessel disease throughout the deep white matter. No acute intracranial abnormality. Specifically, no hemorrhage, hydrocephalus, mass lesion, acute infarction, or significant intracranial injury. Vascular: No hyperdense vessel or unexpected calcification. Skull: No acute calvarial abnormality. Sinuses/Orbits: Visualized paranasal sinuses and mastoids clear. Orbital soft tissues unremarkable. 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 No acute intracranial  abnormality. Electronically Signed   By:  Rolm Baptise M.D.   On: 12/19/2018 21:00   Ct Angio Chest Pe W And/or Wo Contrast  Result Date: 12/20/2018 CLINICAL DATA:  Nose bleed.  Hemoptysis. EXAM: CT ANGIOGRAPHY CHEST WITH CONTRAST TECHNIQUE: Multidetector CT imaging of the chest was performed using the standard protocol during bolus administration of intravenous contrast. Multiplanar CT image reconstructions and MIPs were obtained to evaluate the vascular anatomy. CONTRAST:  105m OMNIPAQUE IOHEXOL 350 MG/ML SOLN COMPARISON:  11/21/2018 FINDINGS: Cardiovascular: Contrast injection is sufficient to demonstrate satisfactory opacification of the pulmonary arteries to the segmental level.Again identified are small subsegmental pulmonary emboli involving the left lower lobe, slightly improved from prior study. The main pulmonary artery is dilated measuring approximately 3.3 cm. Aortic calcifications are noted. The heart size is stable. Coronary artery calcifications are noted. Mediastinum/Nodes: --No mediastinal or hilar lymphadenopathy. --No axillary lymphadenopathy. --No supraclavicular lymphadenopathy. --Normal thyroid gland. --The esophagus is unremarkable Lungs/Pleura: There are worsening bibasilar airspace opacities. There is no pneumothorax. There are trace bilateral pleural effusions, right greater than left. The trachea is unremarkable. Upper Abdomen: No acute abnormality. Musculoskeletal: No chest wall abnormality. No acute or significant osseous findings. Review of the MIP images confirms the above findings. IMPRESSION: 1. Improving subsegmental pulmonary emboli involving the left lower lobe. 2. Worsening bibasilar airspace opacities which may represent atelectasis or infiltrates. 3. Trace bilateral pleural effusions. 4. Dilated main pulmonary artery which can be seen in patients with elevated pulmonary artery pressures. Aortic Atherosclerosis (ICD10-I70.0). Electronically Signed   By: CConstance Holster M.D.   On: 12/20/2018 01:09   Dg Chest Port 1 View  Result Date: 12/26/2018 CLINICAL DATA:  Shortness of breath. EXAM: PORTABLE CHEST 1 VIEW COMPARISON:  CTA chest, 12/20/2018. Chest radiographs, 12/19/2018 and older exams. FINDINGS: Cardiac silhouette is borderline to mildly enlarged. Aorta shows atherosclerotic calcifications and is prominent. No mediastinal or hilar masses. There are linear/reticular lung base opacities, greater on the right, also noted in the right mid lung, likely due to atelectasis. Small pleural effusions are suggested, which were noted on the prior chest CT. Stable prominent bronchovascular markings. No convincing pneumonia or pulmonary edema. No pneumothorax. Skeletal structures are grossly intact. IMPRESSION: 1. No convincing pneumonia or pulmonary edema. 2. Lung base opacities on the right have mildly increased when compared to the prior chest radiographs, most consistent with atelectasis. Small pleural effusions stable compared to the prior chest CTA. Electronically Signed   By: DLajean ManesM.D.   On: 12/26/2018 09:42

## 2018-12-26 NOTE — Progress Notes (Signed)
ANTICOAGULATION CONSULT NOTE - Initial Consult  Pharmacy Consult for  Enoxaparin >>>apixaban Indication: History of PE  Allergies  Allergen Reactions  . Bee Venom Swelling  . Enalapril Maleate Swelling    Patient Measurements: Height: 5\' 3"  (160 cm) IBW/kg (Calculated) : 52.4   Vital Signs: Temp: 97.9 F (36.6 C) (11/12 1000) Temp Source: Oral (11/12 1000) BP: 92/72 (11/12 1000) Pulse Rate: 110 (11/12 1000)  Labs: Recent Labs    12/24/18 0508 12/24/18 0900 12/25/18 0110 12/26/18 0105  HGB 8.3* 9.5* 8.1* 8.4*  HCT 26.6* 28.0* 25.7* 26.7*  PLT 343  --  305 353  CREATININE 1.64*  --  1.51* 1.58*    Estimated Creatinine Clearance: 34.1 mL/min (A) (by C-G formula based on SCr of 1.58 mg/dL (H)).   Medical History: Past Medical History:  Diagnosis Date  . Angioedema   . Anxiety and depression   . CHF (congestive heart failure) ([place])   . Chronic constipation   . Chronic kidney disease    stage 3  . COPD (chronic obstructive pulmonary disease) ([place])   . Diabetes mellitus without complication ([place])   . Gallstones   . GERD (gastroesophageal reflux disease)   . Hypertension   . Left ventricular hypertrophy   . Osteoarthritis   . Prurigo nodularis   . Seizures ([place])   . Stroke ([place])   . Tobacco abuse   . Vitamin D deficiency disease     Assessment: 78 yo patient with recent PE on apixaban PTA. Patient changed to enoxaparin after admission. D Dimer 8.32 >>5.07. MD wishes to restart apixaban in this patient. Last dose of enoxaparin was 0911 this AM. Will restart apixaban at 2100 this PM Goal of Therapy:  Monitor platelets by anticoagulation protocol: Yes   Plan:  D/C enoxaparin D/C Anit-Xa level scheduled for today Start Apixaban 5mg  PO BID at 2100 tonight.  Monitor for further bleeding  Murel Wigle A. Levada Dy, PharmD, BCPS, FNKF Clinical Pharmacist [place] Please utilize Amion for appropriate phone number to reach the unit pharmacist ([place])   12/26/2018,11:56 AM

## 2018-12-27 ENCOUNTER — Inpatient Hospital Stay (HOSPITAL_COMMUNITY): Payer: Medicare HMO

## 2018-12-27 LAB — COMPREHENSIVE METABOLIC PANEL
ALT: 23 U/L (ref 0–44)
AST: 25 U/L (ref 15–41)
Albumin: 1.9 g/dL — ABNORMAL LOW (ref 3.5–5.0)
Alkaline Phosphatase: 117 U/L (ref 38–126)
Anion gap: 9 (ref 5–15)
BUN: 31 mg/dL — ABNORMAL HIGH (ref 8–23)
CO2: 21 mmol/L — ABNORMAL LOW (ref 22–32)
Calcium: 7.8 mg/dL — ABNORMAL LOW (ref 8.9–10.3)
Chloride: 112 mmol/L — ABNORMAL HIGH (ref 98–111)
Creatinine, Ser: 1.68 mg/dL — ABNORMAL HIGH (ref 0.44–1.00)
GFR calc Af Amer: 34 mL/min — ABNORMAL LOW (ref >60)
GFR calc non Af Amer: 29 mL/min — ABNORMAL LOW (ref >60)
Glucose, Bld: 112 mg/dL — ABNORMAL HIGH (ref 70–99)
Potassium: 4.2 mmol/L (ref 3.5–5.1)
Sodium: 142 mmol/L (ref 135–145)
Total Bilirubin: 0.8 mg/dL (ref 0.3–1.2)
Total Protein: 5.6 g/dL — ABNORMAL LOW (ref 6.5–8.1)

## 2018-12-27 LAB — CBC WITH DIFFERENTIAL/PLATELET
Abs Immature Granulocytes: 0.16 10*3/uL — ABNORMAL HIGH (ref 0.00–0.07)
Basophils Absolute: 0 10*3/uL (ref 0.0–0.1)
Basophils Relative: 0 %
Eosinophils Absolute: 0 10*3/uL (ref 0.0–0.5)
Eosinophils Relative: 0 %
HCT: 23.6 % — ABNORMAL LOW (ref 36.0–46.0)
Hemoglobin: 7.6 g/dL — ABNORMAL LOW (ref 12.0–15.0)
Immature Granulocytes: 1 %
Lymphocytes Relative: 6 %
Lymphs Abs: 1.3 10*3/uL (ref 0.7–4.0)
MCH: 27.5 pg (ref 26.0–34.0)
MCHC: 32.2 g/dL (ref 30.0–36.0)
MCV: 85.5 fL (ref 80.0–100.0)
Monocytes Absolute: 1.7 10*3/uL — ABNORMAL HIGH (ref 0.1–1.0)
Monocytes Relative: 7 %
Neutro Abs: 20.2 10*3/uL — ABNORMAL HIGH (ref 1.7–7.7)
Neutrophils Relative %: 86 %
Platelets: 301 10*3/uL (ref 150–400)
RBC: 2.76 MIL/uL — ABNORMAL LOW (ref 3.87–5.11)
RDW: 20.2 % — ABNORMAL HIGH (ref 11.5–15.5)
WBC: 23.3 10*3/uL — ABNORMAL HIGH (ref 4.0–10.5)
nRBC: 0.1 % (ref 0.0–0.2)

## 2018-12-27 LAB — GLUCOSE, CAPILLARY
Glucose-Capillary: 95 mg/dL (ref 70–99)
Glucose-Capillary: 123 mg/dL — ABNORMAL HIGH (ref 70–99)
Glucose-Capillary: 119 mg/dL — ABNORMAL HIGH (ref 70–99)
Glucose-Capillary: 137 mg/dL — ABNORMAL HIGH (ref 70–99)

## 2018-12-27 LAB — PROCALCITONIN: Procalcitonin: 5.18 ng/mL

## 2018-12-27 LAB — C-REACTIVE PROTEIN: CRP: 17.7 mg/dL — ABNORMAL HIGH (ref <1.0)

## 2018-12-27 LAB — BRAIN NATRIURETIC PEPTIDE: B Natriuretic Peptide: 192.9 pg/mL — ABNORMAL HIGH (ref 0.0–100.0)

## 2018-12-27 LAB — D-DIMER, QUANTITATIVE: D-Dimer, Quant: 3.83 ug/mL-FEU — ABNORMAL HIGH (ref 0.00–0.50)

## 2018-12-27 LAB — MAGNESIUM: Magnesium: 1.9 mg/dL (ref 1.7–2.4)

## 2018-12-27 MED ORDER — DEXAMETHASONE SODIUM PHOSPHATE 4 MG/ML IJ SOLN
1.0000 mg | INTRAMUSCULAR | Status: DC
  Administered 2018-12-27: 1 mg via INTRAVENOUS
  Filled 2018-12-27: qty 1

## 2018-12-27 MED ORDER — FUROSEMIDE 10 MG/ML IJ SOLN
20.0000 mg | Freq: Once | INTRAMUSCULAR | Status: AC
  Administered 2018-12-27: 20 mg via INTRAVENOUS
  Filled 2018-12-27: qty 2

## 2018-12-27 NOTE — Progress Notes (Addendum)
PROGRESS NOTE                                                                                                                                                                                                             Patient Demographics:    Katherine [name], is a 78 y.o. female, DOB - [birth year], PTW:[id number]  Outpatient Primary MD for the patient is Valerie Roys, DO    LOS - 6  Admit date - 12/21/2018    Chief complaint.  Nosebleed.     Brief Narrative -  Katherine [name]  is a 78 y.o. female, with history of recent PE diagnosed November 21, 2018 currently on Eliquis, generalized weakness and deconditioning, virtually bedbound and nursing home resident for several months, CKD 3, DM type II, hypertension, COPD, GERD, seizures, obesity, CVA who lives in a nursing home and initially presented to [facility] ER with chief complaints of nosebleeding.  She was found to have epistaxis and it was treated.  Subsequently testing suggested that she had COVID-19 infection and sent to [facility] for further care.  Patient is currently relatively symptom-free.  Denies any headache, no chest pain shortness of breath.  No productive cough.  No focal weakness which is new.   Subjective:   Patient in bed, appears comfortable, denies any headache, no fever, no chest pain or pressure, no shortness of breath , no abdominal pain. No focal weakness.   Assessment  & Plan :     1.  Acute Covid 19 Viral Pneumonitis during the ongoing 2020 Covid 19 Pandemic -mild disease currently on 1 L nasal cannula oxygen and can be titrated down to room air soon.  Continue IV steroids and remdesivir.  Monitor clinically.  Encouraged to sit up in chair and daytime use I-S and flutter valve for pulmonary toiletry and prone in bed at night if possible. Inflamm markers high likely from Asp PNA.   COVID-19 Labs  Recent Labs    12/25/18 0110 12/26/18 0105 12/27/18 0140    DDIMER 5.76* 5.07* 3.83*  CRP 1.7* 1.7* 17.7*    Lab Results  Component Value Date   SARSCOV2NAA POSITIVE (A) 12/19/2018   SARSCOV2NAA NOT DETECTED 11/24/2018   [place] NEGATIVE 11/21/2018   [place] NEGATIVE 09/06/2018     SpO2: 99 % O2 Flow Rate (L/min): 5 L/min  Hepatic Function Latest  Ref Rng & Units 12/27/2018 12/26/2018 12/25/2018  Total Protein 6.5 - 8.1 g/dL 5.6(L) 5.9(L) 6.2(L)  Albumin 3.5 - 5.0 g/dL 1.9(L) 2.1(L) 2.2(L)  AST 15 - 41 U/L 25 43(H) 33  ALT 0 - 44 U/L 23 30 23   Alk Phosphatase 38 - 126 U/L 117 161(H) 165(H)  Total Bilirubin 0.3 - 1.2 mg/dL 0.8 0.4 0.7        Component Value Date/Time   BNP 192.9 (H) 12/27/2018 0140    2.  Recent PE diagnosed October 2020.    Continue Eliquis.  Nosebleed seems to have resolved.  3.  History of ESBL UTI.  She was started on Invanz on 12/19/2018.  Currently no signs of active infection.  4.  Leukocytosis - Aspiration 11/12 + steroids.  5.  History of CVA.  Continue statin for secondary prevention along with anticoagulation.  6.  Weakness, deconditioning, bedbound status.  Supportive care discharge back to SNF once better.  7.  GERD.  Switch PPI to Pepcid, rule out C. difficile first.  8.  Gastroenteritis.  Likely Covid related likely C. difficile colonized, PCR negative.  Diarrhea improved will monitor.  9.  CKD 2.  Baseline creatinine 1.2 will monitor.   10.   History of seizures.  On Keppra.  11.   Episode of unresponsiveness  on 12/24/2018 morning.  Patient has had 2 similar episodes in the past, deemed to be due to seizures, she had thorough neurological work-up less than 1 year ago for 2 similar episodes and they were thought to be seizure related. She was already on Eliquis hence not a candidate for TPA, last known time of normal exam unknown hence again not a candidate for TPA.  She returned to normal within a few hours of Keppra.  Head CT unremarkable, EEG nonspecific.  Case was discussed  with neurologist Dr. Lorraine Lax multiple times.  Kindly see note from 12/24/2018.  Patient had another breakthrough seizure-like episode morning of 12/26/2018, she threw up and had a bowel movement.  Case was discussed with neurologist Dr. Lorraine Lax again on 12/26/2018, Keppra dose has been increased from 500 mg to 1 g.  Will monitor if another breakthrough seizure then will add Vimpat as well.   12.  Aspiration PNA during second seizure episode on 12/26/2018 as she vomited.  Placed on Unasyn.  Supplemental oxygen and monitor.  Chest x-ray stable for now.  Trend pro calcitonin, is 5 on 11/13. BC if febrile  13. Recent episodes of epistaxis prompting this admission at [facility].  Resolved.    14. DM type II.  Continue Tradjenta, placed on sliding scale here.    Lab Results  Component Value Date   HGBA1C 5.1 12/21/2018   CBG (last 3)  Recent Labs    12/26/18 1805 12/26/18 2108 12/27/18 0808  GLUCAP 99 117* 95     Condition - Fair  Family Communication  : DW granddaughter in detail on 12/26/2018 explained guarded prognosis.    Discussed with husband in detail on 12/26/2018 he affirms that he is the primary decision maker, he understands and agrees that his wife's baseline quality of life is very poor requiring multiple hospital admissions this year, he agrees that she should be DNR but that medical treatment should be continued.  Husband again on 12/27/18    Code Status : DNR  Diet :   Diet Order            DIET DYS 3 Room service appropriate? Yes; Fluid consistency: Thin  Diet effective now               Disposition Plan  :  SNF   Consults  :  None  Procedures  :     PUD Prophylaxis : pepcid  DVT Prophylaxis  :   Lovenox >> Eliquis  Lab Results  Component Value Date   PLT 301 12/27/2018    Inpatient Medications  Scheduled Meds:  albuterol  2 puff Inhalation Q6H   apixaban  5 mg Oral Q12H   atorvastatin  80 mg Oral QPM   cholecalciferol  1,000 Units Oral  Daily   dexamethasone (DECADRON) injection  2 mg Intravenous Q24H   docusate sodium  100 mg Oral BID   famotidine  40 mg Oral Daily   gabapentin  300 mg Oral QHS   hydrALAZINE  25 mg Oral TID   insulin aspart  0-5 Units Subcutaneous QHS   insulin aspart  0-9 Units Subcutaneous TID WC   linagliptin  5 mg Oral Daily   multivitamin with minerals  1 tablet Oral Daily   cyanocobalamin  1,000 mcg Oral Daily   Continuous Infusions:  ampicillin-sulbactam (UNASYN) IV 3 g (12/27/18 0052)   levETIRAcetam Stopped (12/26/18 1203)   PRN Meds:.acetaminophen, LORazepam, nitroGLYCERIN, [DISCONTINUED] ondansetron **OR** ondansetron (ZOFRAN) IV  Antibiotics  :    Anti-infectives (From admission, onward)   Start     Dose/Rate Route Frequency Ordered Stop   12/26/18 0945  Ampicillin-Sulbactam (UNASYN) 3 g in sodium chloride 0.9 % 100 mL IVPB     3 g 200 mL/hr over 30 Minutes Intravenous Every 8 hours 12/26/18 0932     12/21/18 1600  remdesivir 100 mg in sodium chloride 0.9 % 250 mL IVPB     100 mg 500 mL/hr over 30 Minutes Intravenous Every 24 hours 12/21/18 1454 12/24/18 1631       Time Spent in minutes  30   Lala Lund M.D on 12/27/2018 at 9:37 AM  To page go to [email or web address] - password [facility]  [facility] -  Office  [phone]    See all Orders from today for further details    Objective:   Vitals:   12/27/18 0151 12/27/18 0152 12/27/18 0400 12/27/18 0800  BP:  108/69 112/70 117/78  Pulse:  88 83 93  Resp:  20 (!) 21 20  Temp: 98 F (36.7 C) 98 F (36.7 C) 98.2 F (36.8 C) 97.8 F (36.6 C)  TempSrc:  Oral Oral Oral  SpO2:   100% 99%  Weight:  102.5 kg    Height:  5' 3"  (1.6 m)      Wt Readings from Last 3 Encounters:  12/27/18 102.5 kg  12/19/18 102.5 kg  11/21/18 104 kg     Intake/Output Summary (Last 24 hours) at 12/27/2018 9323 Last data filed at 12/27/2018 0500 Gross per 24 hour  Intake 1519.59 ml  Output 500 ml  Net 1019.59 ml      Physical Exam  Awake , comfortable, chronic right-sided deviation of both eyes right more than left, 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    [place].AT,  Supple Neck,No JVD, No cervical lymphadenopathy appriciated.  Symmetrical Chest wall movement, Good air movement bilaterally, CTAB RRR,No Gallops, Rubs or new Murmurs, No Parasternal Heave +ve B.Sounds, Abd Soft, No tenderness, No organomegaly appriciated, No rebound - guarding or rigidity. No Cyanosis, Clubbing or edema, No new Rash or bruise    Data Review:  CBC Recent Labs  Lab 12/23/18 0410 12/24/18 0508 12/24/18 0900 12/25/18 0110 12/26/18 0105 12/27/18 0140  WBC 2.3* 2.6*  --  4.7 7.6 23.3*  HGB 8.5* 8.3* 9.5* 8.1* 8.4* 7.6*  HCT 26.7* 26.6* 28.0* 25.7* 26.7* 23.6*  PLT 322 343  --  305 353 301  MCV 84.5 85.3  --  84.5 85.6 85.5  MCH 26.9 26.6  --  26.6 26.9 27.5  MCHC 31.8 31.2  --  31.5 31.5 32.2  RDW 19.8* 20.0*  --  20.3* 20.6* 20.2*  LYMPHSABS 0.6* 0.7  --  1.2 1.9 1.3  MONOABS 0.1 0.3  --  0.5 0.8 1.7*  EOSABS 0.0 0.0  --  0.0 0.0 0.0  BASOSABS 0.0 0.0  --  0.0 0.0 0.0    Chemistries  Recent Labs  Lab 12/23/18 0410 12/24/18 0508 12/24/18 0900 12/25/18 0110 12/26/18 0105 12/27/18 0140  NA 134* 136 136 137 133* 142  K 4.2 4.3 4.1 4.0 3.5 4.2  CL 104 106  --  108 105 112*  CO2 22 22  --  22 20* 21*  GLUCOSE 124* 117*  --  94 102* 112*  BUN 26* 30*  --  32* 30* 31*  CREATININE 1.55* 1.64*  --  1.51* 1.58* 1.68*  CALCIUM 8.2* 8.3*  --  8.0* 7.6* 7.8*  MG 1.8 1.8  --  1.9 1.9 1.9  AST 43* 35  --  33 43* 25  ALT 28 25  --  23 30 23   ALKPHOS 205* 192*  --  165* 161* 117  BILITOT 0.4 0.7  --  0.7 0.4 0.8   ------------------------------------------------------------------------------------------------------------------ No results for input(s): CHOL, HDL, LDLCALC, TRIG, CHOLHDL, LDLDIRECT in the last 72 hours.  Lab Results  Component  Value Date   HGBA1C 5.1 12/21/2018   ------------------------------------------------------------------------------------------------------------------ No results for input(s): TSH, T4TOTAL, T3FREE, THYROIDAB in the last 72 hours.  Invalid input(s): FREET3  Cardiac Enzymes No results for input(s): CKMB, TROPONINI, MYOGLOBIN in the last 168 hours.  Invalid input(s): CK ------------------------------------------------------------------------------------------------------------------    Component Value Date/Time   BNP 192.9 (H) 12/27/2018 0140    Micro Results Recent Results (from the past 240 hour(s))  SARS CORONAVIRUS 2 (TAT 6-24 HRS) Nasopharyngeal Nasopharyngeal Swab     Status: Abnormal   Collection Time: 12/19/18 11:24 PM   Specimen: Nasopharyngeal Swab  Result Value Ref Range Status   SARS Coronavirus 2 POSITIVE (A) NEGATIVE Final    Comment: RESULT CALLED TO, READ BACK BY AND VERIFIED WITH: Henderson Newcomer RN 15:20 12/20/18 ([email or web address]) (NOTE) SARS-CoV-2 target nucleic acids are DETECTED. The SARS-CoV-2 RNA is generally detectable in upper and lower respiratory specimens during the acute phase of infection. Positive results are indicative of active infection with SARS-CoV-2. Clinical  correlation with patient history and other diagnostic information is necessary to determine patient infection status. Positive results do  not rule out bacterial infection or co-infection with other viruses. The expected result is Negative. Fact Sheet for Patients: SugarRoll.be Fact Sheet for Healthcare Providers: https://www.woods-mathews.com/ This test is not yet approved or cleared by the [place] FDA and  has been authorized for detection and/or diagnosis of SARS-CoV-2 by FDA under an Emergency Use Authorization (EUA). This EUA will remain  in effect (meaning this test can be used) for  the duration of the COVID-19 declaration under Section  564(b)(1) of the Act, 21 U.S.C. section 360bbb-3(b)(1), unless the authorization is terminated or revoked sooner. Performed at [facility], [place]  [address], [place], [place] [address]   MRSA PCR Screening     Status: None   Collection Time: 12/21/18  8:15 AM   Specimen: Nasal Mucosa; Nasopharyngeal  Result Value Ref Range Status   MRSA by PCR NEGATIVE NEGATIVE Final    Comment:        The GeneXpert MRSA Assay (FDA approved for NASAL specimens only), is one component of a comprehensive MRSA colonization surveillance program. It is not intended to diagnose MRSA infection nor to guide or monitor treatment for MRSA infections. Performed at [facility], [address] [place], [place] [address]   C difficile quick scan w PCR reflex     Status: Abnormal   Collection Time: 12/21/18  3:26 PM   Specimen: STOOL  Result Value Ref Range Status   C Diff antigen POSITIVE (A) NEGATIVE Final   C Diff toxin NEGATIVE NEGATIVE Final   C Diff interpretation Results are indeterminate. See PCR results.  Final    Comment: Performed at [facility], [place] [address], [place], [place] [address]  C. Diff by PCR, Reflexed     Status: None   Collection Time: 12/21/18  3:26 PM  Result Value Ref Range Status   Toxigenic C. Difficile by PCR NEGATIVE NEGATIVE Final    Comment: Patient is colonized with non toxigenic C. difficile. May not need treatment unless significant symptoms are present. Performed at [facility], [place] [address], [place], [place] [address]     Radiology Reports Dg Chest 2 View  Result Date: 12/19/2018 CLINICAL DATA:  Hemoptysis, cough EXAM: CHEST - 2 VIEW COMPARISON:  11/24/2018 FINDINGS: Increased markings in the lower lungs, likely scarring, similar to prior study. Heart is borderline in size. Aortic atherosclerosis. No acute airspace opacities or effusions. No acute bony abnormality. IMPRESSION: Bibasilar linear densities, likely scarring.  No definite acute process. Electronically Signed   By: Rolm Baptise M.D.   On: 12/19/2018 20:59   Ct Head Wo Contrast  Result Date: 12/24/2018 CLINICAL DATA:  Altered level of consciousness EXAM: CT HEAD WITHOUT CONTRAST TECHNIQUE: Contiguous axial images were obtained from the base of the skull through the vertex without intravenous contrast. COMPARISON:  12/19/2018 FINDINGS: Brain: No evidence of acute infarction, hemorrhage, hydrocephalus, extra-axial collection or mass lesion/mass effect. Mild periventricular white matter hypodensity. Vascular: No hyperdense vessel or unexpected calcification. Skull: Normal. Negative for fracture or focal lesion. Sinuses/Orbits: No acute finding. Other: None. IMPRESSION: No acute intracranial pathology.  Small-vessel white matter disease. Electronically Signed   By: Eddie Candle M.D.   On: 12/24/2018 09:04   Ct Head Wo Contrast  Result Date: 12/19/2018 CLINICAL DATA:  Headache, on Eliquis EXAM: CT HEAD WITHOUT CONTRAST TECHNIQUE: Contiguous axial images were obtained from the base of the skull through the vertex without intravenous contrast. COMPARISON:  09/06/2018 FINDINGS: Brain: Patchy chronic small vessel disease throughout the deep white matter. No acute intracranial abnormality. Specifically, no hemorrhage, hydrocephalus, mass lesion, acute infarction, or significant intracranial injury. Vascular: No hyperdense vessel or unexpected calcification. Skull: No acute calvarial abnormality. Sinuses/Orbits: Visualized paranasal sinuses and mastoids clear. Orbital soft tissues unremarkable. 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 No acute intracranial abnormality. Electronically Signed   By: Rolm Baptise M.D.   On: 12/19/2018 21:00   Ct Angio Chest Pe W And/or Wo Contrast  Result Date: 12/20/2018 CLINICAL DATA:  Nose bleed.  Hemoptysis. EXAM: CT ANGIOGRAPHY CHEST WITH CONTRAST TECHNIQUE: Multidetector CT imaging  of  the chest was performed using the standard protocol during bolus administration of intravenous contrast. Multiplanar CT image reconstructions and MIPs were obtained to evaluate the vascular anatomy. CONTRAST:  53m OMNIPAQUE IOHEXOL 350 MG/ML SOLN COMPARISON:  11/21/2018 FINDINGS: Cardiovascular: Contrast injection is sufficient to demonstrate satisfactory opacification of the pulmonary arteries to the segmental level.Again identified are small subsegmental pulmonary emboli involving the left lower lobe, slightly improved from prior study. The main pulmonary artery is dilated measuring approximately 3.3 cm. Aortic calcifications are noted. The heart size is stable. Coronary artery calcifications are noted. Mediastinum/Nodes: --No mediastinal or hilar lymphadenopathy. --No axillary lymphadenopathy. --No supraclavicular lymphadenopathy. --Normal thyroid gland. --The esophagus is unremarkable Lungs/Pleura: There are worsening bibasilar airspace opacities. There is no pneumothorax. There are trace bilateral pleural effusions, right greater than left. The trachea is unremarkable. Upper Abdomen: No acute abnormality. Musculoskeletal: No chest wall abnormality. No acute or significant osseous findings. Review of the MIP images confirms the above findings. IMPRESSION: 1. Improving subsegmental pulmonary emboli involving the left lower lobe. 2. Worsening bibasilar airspace opacities which may represent atelectasis or infiltrates. 3. Trace bilateral pleural effusions. 4. Dilated main pulmonary artery which can be seen in patients with elevated pulmonary artery pressures. Aortic Atherosclerosis (ICD10-I70.0). Electronically Signed   By: CConstance HolsterM.D.   On: 12/20/2018 01:09   Cxr Am  Result Date: 12/27/2018 CLINICAL DATA:  Shortness of breath. EXAM: PORTABLE CHEST 1 VIEW COMPARISON:  12/26/2018 FINDINGS: Lordotic technique is demonstrated as the lungs are adequately inflated with subtle linear density right  midlung likely atelectasis and unchanged. Mild opacification over the right base unchanged likely small amount of pleural fluid/atelectasis. Minimal linear atelectasis left base. Mild stable cardiomegaly. Remainder of the exam is unchanged. IMPRESSION: Stable minimal bibasilar opacification likely bibasilar atelectasis with small amount right pleural fluid. Mild stable cardiomegaly. Electronically Signed   By: DMarin OlpM.D.   On: 12/27/2018 08:07   Dg Chest Port 1 View  Result Date: 12/26/2018 CLINICAL DATA:  Shortness of breath. EXAM: PORTABLE CHEST 1 VIEW COMPARISON:  CTA chest, 12/20/2018. Chest radiographs, 12/19/2018 and older exams. FINDINGS: Cardiac silhouette is borderline to mildly enlarged. Aorta shows atherosclerotic calcifications and is prominent. No mediastinal or hilar masses. There are linear/reticular lung base opacities, greater on the right, also noted in the right mid lung, likely due to atelectasis. Small pleural effusions are suggested, which were noted on the prior chest CT. Stable prominent bronchovascular markings. No convincing pneumonia or pulmonary edema. No pneumothorax. Skeletal structures are grossly intact. IMPRESSION: 1. No convincing pneumonia or pulmonary edema. 2. Lung base opacities on the right have mildly increased when compared to the prior chest radiographs, most consistent with atelectasis. Small pleural effusions stable compared to the prior chest CTA. Electronically Signed   By: DLajean ManesM.D.   On: 12/26/2018 09:42

## 2018-12-27 NOTE — Progress Notes (Signed)
1930 [name] c/o 9/10 global headache.  Not throbbing, just aching.  Says she gets them occasionally and this is not the worst HA she's ever had.  650 mg PO tylenol given, will continue to monitor.

## 2018-12-28 LAB — COMPREHENSIVE METABOLIC PANEL
ALT: 23 U/L (ref 0–44)
AST: 24 U/L (ref 15–41)
Albumin: 2 g/dL — ABNORMAL LOW (ref 3.5–5.0)
Alkaline Phosphatase: 103 U/L (ref 38–126)
Anion gap: 6 (ref 5–15)
BUN: 23 mg/dL (ref 8–23)
CO2: 22 mmol/L (ref 22–32)
Calcium: 8 mg/dL — ABNORMAL LOW (ref 8.9–10.3)
Chloride: 108 mmol/L (ref 98–111)
Creatinine, Ser: 1.34 mg/dL — ABNORMAL HIGH (ref 0.44–1.00)
GFR calc Af Amer: 44 mL/min — ABNORMAL LOW (ref >60)
GFR calc non Af Amer: 38 mL/min — ABNORMAL LOW (ref >60)
Glucose, Bld: 113 mg/dL — ABNORMAL HIGH (ref 70–99)
Potassium: 4.5 mmol/L (ref 3.5–5.1)
Sodium: 136 mmol/L (ref 135–145)
Total Bilirubin: 0.9 mg/dL (ref 0.3–1.2)
Total Protein: 6 g/dL — ABNORMAL LOW (ref 6.5–8.1)

## 2018-12-28 LAB — MAGNESIUM: Magnesium: 1.8 mg/dL (ref 1.7–2.4)

## 2018-12-28 LAB — CBC WITH DIFFERENTIAL/PLATELET
Abs Immature Granulocytes: 0.07 10*3/uL (ref 0.00–0.07)
Basophils Absolute: 0 10*3/uL (ref 0.0–0.1)
Basophils Relative: 0 %
Eosinophils Absolute: 0 10*3/uL (ref 0.0–0.5)
Eosinophils Relative: 0 %
HCT: 23.9 % — ABNORMAL LOW (ref 36.0–46.0)
Hemoglobin: 7.6 g/dL — ABNORMAL LOW (ref 12.0–15.0)
Immature Granulocytes: 1 %
Lymphocytes Relative: 5 %
Lymphs Abs: 0.6 10*3/uL — ABNORMAL LOW (ref 0.7–4.0)
MCH: 26.9 pg (ref 26.0–34.0)
MCHC: 31.8 g/dL (ref 30.0–36.0)
MCV: 84.5 fL (ref 80.0–100.0)
Monocytes Absolute: 0.5 10*3/uL (ref 0.1–1.0)
Monocytes Relative: 4 %
Neutro Abs: 10.8 10*3/uL — ABNORMAL HIGH (ref 1.7–7.7)
Neutrophils Relative %: 90 %
Platelets: 250 10*3/uL (ref 150–400)
RBC: 2.83 MIL/uL — ABNORMAL LOW (ref 3.87–5.11)
RDW: 19.8 % — ABNORMAL HIGH (ref 11.5–15.5)
WBC: 11.9 10*3/uL — ABNORMAL HIGH (ref 4.0–10.5)
nRBC: 0 % (ref 0.0–0.2)

## 2018-12-28 LAB — GLUCOSE, CAPILLARY
Glucose-Capillary: 103 mg/dL — ABNORMAL HIGH (ref 70–99)
Glucose-Capillary: 94 mg/dL (ref 70–99)
Glucose-Capillary: 169 mg/dL — ABNORMAL HIGH (ref 70–99)
Glucose-Capillary: 163 mg/dL — ABNORMAL HIGH (ref 70–99)

## 2018-12-28 LAB — D-DIMER, QUANTITATIVE: D-Dimer, Quant: 0.96 ug/mL-FEU — ABNORMAL HIGH (ref 0.00–0.50)

## 2018-12-28 LAB — BRAIN NATRIURETIC PEPTIDE: B Natriuretic Peptide: 132.2 pg/mL — ABNORMAL HIGH (ref 0.0–100.0)

## 2018-12-28 LAB — C-REACTIVE PROTEIN: CRP: 18.3 mg/dL — ABNORMAL HIGH (ref <1.0)

## 2018-12-28 LAB — PROCALCITONIN: Procalcitonin: 3.64 ng/mL

## 2018-12-28 MED ORDER — HYDROCOD POLST-CPM POLST ER 10-8 MG/5ML PO SUER
5.0000 mL | Freq: Two times a day (BID) | ORAL | Status: DC | PRN
  Administered 2018-12-28 – 2018-12-29 (×2): 5 mL via ORAL
  Filled 2018-12-28 (×2): qty 5

## 2018-12-28 MED ORDER — DEXAMETHASONE SODIUM PHOSPHATE 4 MG/ML IJ SOLN
4.0000 mg | INTRAMUSCULAR | Status: DC
  Administered 2018-12-28 – 2018-12-29 (×2): 4 mg via INTRAVENOUS
  Filled 2018-12-28 (×2): qty 1

## 2018-12-28 NOTE — Progress Notes (Signed)
PROGRESS NOTE                                                                                                                                                                                                             Patient Demographics:    Katherine [name], is a 78 y.o. female, DOB - [birth year], Katherine [name]:[id number]  Outpatient Primary MD for the patient is Katherine Roys, DO    LOS - 7  Admit date - 12/21/2018    Chief complaint.  Nosebleed.     Brief Narrative -  Katherine [name]  is a 78 y.o. female, with history of recent PE diagnosed November 21, 2018 currently on Eliquis, generalized weakness and deconditioning, virtually bedbound and nursing home resident for several months, CKD 3, DM type II, hypertension, COPD, GERD, seizures, obesity, CVA who lives in a nursing home and initially presented to [name] with chief complaints of nosebleeding.  She was found to have epistaxis and it was treated.  Subsequently testing suggested that she had COVID-19 infection and sent to [name].  Patient is currently relatively symptom-free.  Denies any headache, no chest pain shortness of breath.  No productive cough.  No focal weakness which is new.   Subjective:   Patient in bed, appears comfortable, denies any headache, no fever, no chest pain or pressure, no shortness of breath , no abdominal pain. No focal weakness.   Assessment  & Plan :     1.  Acute Covid 19 Viral Pneumonitis during the ongoing 2020 Covid 19 Pandemic -mild disease currently on 1 L nasal cannula oxygen and can be titrated down to room air soon.  Continue IV steroids and remdesivir.  Monitor clinically.  Encouraged to sit up in chair and daytime use I-S and flutter valve for pulmonary toiletry and prone in bed at night if possible. Inflamm markers high likely from Asp PNA, since her CRP rose higher Decadron dose on 12/28/2018 thereafter cut down  again.   COVID-19 Labs  Recent Labs    12/26/18 0105 12/27/18 0140 12/28/18 0254  DDIMER 5.07* 3.83* 0.96*  CRP 1.7* 17.7* 18.3*    Lab Results  Component Value Date   SARSCOV2NAA POSITIVE (A) 12/19/2018   SARSCOV2NAA NOT DETECTED 11/24/2018   Katherine [name] NEGATIVE 11/21/2018   Katherine [name] NEGATIVE 09/06/2018     SpO2:  92 % O2 Flow Rate (L/min): 1 L/min  Hepatic Function Latest Ref Rng & Units 12/28/2018 12/27/2018 12/26/2018  Total Protein 6.5 - 8.1 g/dL 6.0(L) 5.6(L) 5.9(L)  Albumin 3.5 - 5.0 g/dL 2.0(L) 1.9(L) 2.1(L)  AST 15 - 41 U/L 24 25 43(H)  ALT 0 - 44 U/L 23 23 30   Alk Phosphatase 38 - 126 U/L 103 117 161(H)  Total Bilirubin 0.3 - 1.2 mg/dL 0.9 0.8 0.4        Component Value Date/Time   BNP 132.2 (H) 12/28/2018 0254    2.  Recent PE diagnosed October 2020.    Continue Eliquis.  Nosebleed seems to have resolved.  3.  History of ESBL UTI.  She was started on Invanz on 12/19/2018.  Currently no signs of active infection.  4.  Leukocytosis - Aspiration 11/12 + steroids.  5.  History of CVA.  Continue statin for secondary prevention along with anticoagulation with eliquis.  6.  Weakness, deconditioning, bedbound status.  Supportive [name] discharge back to SNF once better.  7.  GERD.  Switch PPI to Pepcid, rule out C. difficile first.  8.  Gastroenteritis.  Likely Covid related likely C. difficile colonized, PCR negative.  Diarrhea has completely resolved.  9.  CKD 2.  Baseline creatinine 1.2 will monitor.   10.   History of seizures.  On Keppra.  11.   Episode of unresponsiveness  on 12/24/2018 morning.  Patient has had 2 similar episodes in the past, deemed to be due to seizures, she had thorough neurological work-up less than 1 year ago for 2 similar episodes and they were thought to be seizure related. She was already on Eliquis hence not a candidate for TPA, last known time of normal exam unknown hence again not a candidate for TPA.  She returned  to normal within a few hours of Keppra.  Head CT unremarkable, EEG nonspecific.  Case was discussed with neurologist Dr. Lorraine Lax multiple times.  Kindly see note from 12/24/2018.  Patient had another breakthrough seizure-like episode morning of 12/26/2018, she threw up and had a bowel movement.  Case was discussed with neurologist Dr. Lorraine Lax again on 12/26/2018, Keppra dose has been increased from 500 mg to 1 g.  Will monitor if another breakthrough seizure then will add Vimpat as well.   12.  Aspiration PNA during second seizure episode on 12/26/2018 as she vomited.  Placed on Unasyn.  Supplemental oxygen and monitor.  Chest x-ray stable for now.  Trend pro calcitonin & BC if febrile.  Results for Katherine [name], Katherine [name] (MRN [id number]) as of 12/28/2018 09:51  Ref. Range 12/26/2018 01:34 12/27/2018 01:40 12/28/2018 02:54  Procalcitonin Latest Units: ng/mL <0.10 5.18 3.64    13. Recent episodes of epistaxis prompting this admission at [facility].  Resolved.    14. DM type II.  Continue Tradjenta, placed on sliding scale here.    Lab Results  Component Value Date   HGBA1C 5.1 12/21/2018   CBG (last 3)  Recent Labs    12/27/18 1726 12/27/18 2125 12/28/18 0745  GLUCAP 119* 137* 103*     Condition - Fair  Family Communication  : DW granddaughter in detail on 12/26/2018 explained guarded prognosis.    Discussed with husband in detail on 12/26/2018 he affirms that he is the primary decision maker, he understands and agrees that his wife's baseline quality of life is very poor requiring multiple hospital admissions this year, he agrees that she should be DNR but that medical treatment should be continued.  Husband again on 12/27/18   Code Status : DNR  Diet :   Diet Order            DIET DYS 3 Room service appropriate? Yes; Fluid consistency: Thin  Diet effective now               Disposition Plan  :  SNF   Consults  :  None  Procedures  :     PUD Prophylaxis : pepcid  DVT  Prophylaxis  :   Lovenox >> Eliquis  Lab Results  Component Value Date   PLT 250 12/28/2018    Inpatient Medications  Scheduled Meds:  albuterol  2 puff Inhalation Q6H   apixaban  5 mg Oral Q12H   atorvastatin  80 mg Oral QPM   cholecalciferol  1,000 Units Oral Daily   dexamethasone (DECADRON) injection  4 mg Intravenous Q24H   docusate sodium  100 mg Oral BID   famotidine  40 mg Oral Daily   gabapentin  300 mg Oral QHS   hydrALAZINE  25 mg Oral TID   insulin aspart  0-5 Units Subcutaneous QHS   insulin aspart  0-9 Units Subcutaneous TID WC   linagliptin  5 mg Oral Daily   multivitamin with minerals  1 tablet Oral Daily   cyanocobalamin  1,000 mcg Oral Daily   Continuous Infusions:  ampicillin-sulbactam (UNASYN) IV 3 g (12/28/18 1700)   levETIRAcetam 1,000 mg (12/28/18 0939)   PRN Meds:.acetaminophen, LORazepam, nitroGLYCERIN, [DISCONTINUED] ondansetron **OR** ondansetron (ZOFRAN) IV  Antibiotics  :    Anti-infectives (From admission, onward)   Start     Dose/Rate Route Frequency Ordered Stop   12/26/18 0945  Ampicillin-Sulbactam (UNASYN) 3 g in sodium chloride 0.9 % 100 mL IVPB     3 g 200 mL/hr over 30 Minutes Intravenous Every 8 hours 12/26/18 0932     12/21/18 1600  remdesivir 100 mg in sodium chloride 0.9 % 250 mL IVPB     100 mg 500 mL/hr over 30 Minutes Intravenous Every 24 hours 12/21/18 1454 12/24/18 1631       Time Spent in minutes  30   Lala Lund M.D on 12/28/2018 at 9:45 AM  To page go to [email or web address] - password [facility]  [facility] -  Office  [phone]    See all Orders from today for further details    Objective:   Vitals:   12/27/18 2100 12/28/18 0000 12/28/18 0325 12/28/18 0800  BP:  104/73 103/67 119/74  Pulse:  85 97 92  Resp:  17 (!) 22 (!) 21  Temp:  98 F (36.7 C) 98.4 F (36.9 C) 98.6 F (37 C)  TempSrc:  Oral Oral Oral  SpO2: 96% 92% 92% 92%  Weight:      Height:        Wt Readings from Last  3 Encounters:  12/27/18 102.5 kg  12/19/18 102.5 kg  11/21/18 104 kg     Intake/Output Summary (Last 24 hours) at 12/28/2018 0945 Last data filed at 12/28/2018 0500 Gross per 24 hour  Intake 850 ml  Output 2440 ml  Net -1590 ml     Physical Exam  Awake , comfortable, chronic right-sided deviation of both eyes right more than left, 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    Katherine [name]  Supple Neck,No JVD, No cervical lymphadenopathy appriciated.  Symmetrical Chest wall movement, Good air movement bilaterally, CTAB RRR,No Gallops, Rubs or new Murmurs,  No Parasternal Heave +ve B.Sounds, Abd Soft, No tenderness, No organomegaly appriciated, No rebound - guarding or rigidity. No Cyanosis, Clubbing or edema, No new Rash or bruise   Data Review:    CBC Recent Labs  Lab 12/24/18 0508 12/24/18 0900 12/25/18 0110 12/26/18 0105 12/27/18 0140 12/28/18 0254  WBC 2.6*  --  4.7 7.6 23.3* 11.9*  HGB 8.3* 9.5* 8.1* 8.4* 7.6* 7.6*  HCT 26.6* 28.0* 25.7* 26.7* 23.6* 23.9*  PLT 343  --  305 353 301 250  MCV 85.3  --  84.5 85.6 85.5 84.5  MCH 26.6  --  26.6 26.9 27.5 26.9  MCHC 31.2  --  31.5 31.5 32.2 31.8  RDW 20.0*  --  20.3* 20.6* 20.2* 19.8*  LYMPHSABS 0.7  --  1.2 1.9 1.3 0.6*  MONOABS 0.3  --  0.5 0.8 1.7* 0.5  EOSABS 0.0  --  0.0 0.0 0.0 0.0  BASOSABS 0.0  --  0.0 0.0 0.0 0.0    Chemistries  Recent Labs  Lab 12/24/18 0508 12/24/18 0900 12/25/18 0110 12/26/18 0105 12/27/18 0140 12/28/18 0254  NA 136 136 137 133* 142 136  K 4.3 4.1 4.0 3.5 4.2 4.5  CL 106  --  108 105 112* 108  CO2 22  --  22 20* 21* 22  GLUCOSE 117*  --  94 102* 112* 113*  BUN 30*  --  32* 30* 31* 23  CREATININE 1.64*  --  1.51* 1.58* 1.68* 1.34*  CALCIUM 8.3*  --  8.0* 7.6* 7.8* 8.0*  MG 1.8  --  1.9 1.9 1.9 1.8  AST 35  --  33 43* 25 24  ALT 25  --  23 30 23 23   ALKPHOS 192*  --  165* 161* 117 103  BILITOT 0.7  --  0.7 0.4 0.8 0.9    ------------------------------------------------------------------------------------------------------------------ No results for input(s): CHOL, HDL, LDLCALC, TRIG, CHOLHDL, LDLDIRECT in the last 72 hours.  Lab Results  Component Value Date   HGBA1C 5.1 12/21/2018   ------------------------------------------------------------------------------------------------------------------ No results for input(s): TSH, T4TOTAL, T3FREE, THYROIDAB in the last 72 hours.  Invalid input(s): FREET3  Cardiac Enzymes No results for input(s): CKMB, TROPONINI, MYOGLOBIN in the last 168 hours.  Invalid input(s): CK ------------------------------------------------------------------------------------------------------------------    Component Value Date/Time   BNP 132.2 (H) 12/28/2018 0254    Micro Results Recent Results (from the past 240 hour(s))  SARS CORONAVIRUS 2 (TAT 6-24 HRS) Nasopharyngeal Nasopharyngeal Swab     Status: Abnormal   Collection Time: 12/19/18 11:24 PM   Specimen: Nasopharyngeal Swab  Result Value Ref Range Status   SARS Coronavirus 2 POSITIVE (A) NEGATIVE Final    Comment: RESULT CALLED TO, READ BACK BY AND VERIFIED WITH: Henderson Newcomer RN 15:20 12/20/18 ([email or web address]) (NOTE) SARS-CoV-2 target nucleic acids are DETECTED. The SARS-CoV-2 RNA is generally detectable in upper and lower respiratory specimens during the acute phase of infection. Positive results are indicative of active infection with SARS-CoV-2. Clinical  correlation with patient history and other diagnostic information is necessary to determine patient infection status. Positive results do  not rule out bacterial infection or co-infection with other viruses. The expected result is Negative. Fact Sheet for Patients: SugarRoll.be Fact Sheet for Healthcare Providers: https://www.woods-mathews.com/ This test is not yet approved or cleared by the [place] FDA and  has been  authorized for detection and/or diagnosis of SARS-CoV-2 by FDA under an Emergency Use Authorization (EUA). This EUA will remain  in effect (meaning this test can be used)  for  the duration of the COVID-19 declaration under Section 564(b)(1) of the Act, 21 U.S.C. section 360bbb-3(b)(1), unless the authorization is terminated or revoked sooner. Performed at [facility], [place] [address] [place], [place] [address]   MRSA PCR Screening     Status: None   Collection Time: 12/21/18  8:15 AM   Specimen: Nasal Mucosa; Nasopharyngeal  Result Value Ref Range Status   MRSA by PCR NEGATIVE NEGATIVE Final    Comment:        The GeneXpert MRSA Assay (FDA approved for NASAL specimens only), is one component of a comprehensive MRSA colonization surveillance program. It is not intended to diagnose MRSA infection nor to guide or monitor treatment for MRSA infections. Performed at [facility], [place] [place], [place] [address]   C difficile quick scan w PCR reflex     Status: Abnormal   Collection Time: 12/21/18  3:26 PM   Specimen: STOOL  Result Value Ref Range Status   C Diff antigen POSITIVE (A) NEGATIVE Final   C Diff toxin NEGATIVE NEGATIVE Final   C Diff interpretation Results are indeterminate. See PCR results.  Final    Comment: Performed at [facility], [place] [address], [place], [place] [address]  C. Diff by PCR, Reflexed     Status: None   Collection Time: 12/21/18  3:26 PM  Result Value Ref Range Status   Toxigenic C. Difficile by PCR NEGATIVE NEGATIVE Final    Comment: Patient is colonized with non toxigenic C. difficile. May not need treatment unless significant symptoms are present. Performed at [facility], [place] [address], [place], [place] [address]     Radiology Reports Dg Chest 2 View  Result Date: 12/19/2018 CLINICAL DATA:  Hemoptysis, cough EXAM: CHEST - 2 VIEW COMPARISON:  11/24/2018 FINDINGS: Increased markings in  the lower lungs, likely scarring, similar to prior study. Heart is borderline in size. Aortic atherosclerosis. No acute airspace opacities or effusions. No acute bony abnormality. IMPRESSION: Bibasilar linear densities, likely scarring. No definite acute process. Electronically Signed   By: Rolm Baptise M.D.   On: 12/19/2018 20:59   Ct Head Wo Contrast  Result Date: 12/24/2018 CLINICAL DATA:  Altered level of consciousness EXAM: CT HEAD WITHOUT CONTRAST TECHNIQUE: Contiguous axial images were obtained from the base of the skull through the vertex without intravenous contrast. COMPARISON:  12/19/2018 FINDINGS: Brain: No evidence of acute infarction, hemorrhage, hydrocephalus, extra-axial collection or mass lesion/mass effect. Mild periventricular white matter hypodensity. Vascular: No hyperdense vessel or unexpected calcification. Skull: Normal. Negative for fracture or focal lesion. Sinuses/Orbits: No acute finding. Other: None. IMPRESSION: No acute intracranial pathology.  Small-vessel white matter disease. Electronically Signed   By: Eddie Candle M.D.   On: 12/24/2018 09:04   Ct Head Wo Contrast  Result Date: 12/19/2018 CLINICAL DATA:  Headache, on Eliquis EXAM: CT HEAD WITHOUT CONTRAST TECHNIQUE: Contiguous axial images were obtained from the base of the skull through the vertex without intravenous contrast. COMPARISON:  09/06/2018 FINDINGS: Brain: Patchy chronic small vessel disease throughout the deep white matter. No acute intracranial abnormality. Specifically, no hemorrhage, hydrocephalus, mass lesion, acute infarction, or significant intracranial injury. Vascular: No hyperdense vessel or unexpected calcification. Skull: No acute calvarial abnormality. Sinuses/Orbits: Visualized paranasal sinuses and mastoids clear. Orbital soft tissues unremarkable. 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 No acute intracranial abnormality. Electronically Signed    By: Rolm Baptise M.D.  On: 12/19/2018 21:00   Ct Angio Chest Pe W And/or Wo Contrast  Result Date: 12/20/2018 CLINICAL DATA:  Nose bleed.  Hemoptysis. EXAM: CT ANGIOGRAPHY CHEST WITH CONTRAST TECHNIQUE: Multidetector CT imaging of the chest was performed using the standard protocol during bolus administration of intravenous contrast. Multiplanar CT image reconstructions and MIPs were obtained to evaluate the vascular anatomy. CONTRAST:  42m OMNIPAQUE IOHEXOL 350 MG/ML SOLN COMPARISON:  11/21/2018 FINDINGS: Cardiovascular: Contrast injection is sufficient to demonstrate satisfactory opacification of the pulmonary arteries to the segmental level.Again identified are small subsegmental pulmonary emboli involving the left lower lobe, slightly improved from prior study. The main pulmonary artery is dilated measuring approximately 3.3 cm. Aortic calcifications are noted. The heart size is stable. Coronary artery calcifications are noted. Mediastinum/Nodes: --No mediastinal or hilar lymphadenopathy. --No axillary lymphadenopathy. --No supraclavicular lymphadenopathy. --Normal thyroid gland. --The esophagus is unremarkable Lungs/Pleura: There are worsening bibasilar airspace opacities. There is no pneumothorax. There are trace bilateral pleural effusions, right greater than left. The trachea is unremarkable. Upper Abdomen: No acute abnormality. Musculoskeletal: No chest wall abnormality. No acute or significant osseous findings. Review of the MIP images confirms the above findings. IMPRESSION: 1. Improving subsegmental pulmonary emboli involving the left lower lobe. 2. Worsening bibasilar airspace opacities which may represent atelectasis or infiltrates. 3. Trace bilateral pleural effusions. 4. Dilated main pulmonary artery which can be seen in patients with elevated pulmonary artery pressures. Aortic Atherosclerosis (ICD10-I70.0). Electronically Signed   By: CConstance HolsterM.D.   On: 12/20/2018 01:09   Cxr  Am  Result Date: 12/27/2018 CLINICAL DATA:  Shortness of breath. EXAM: PORTABLE CHEST 1 VIEW COMPARISON:  12/26/2018 FINDINGS: Lordotic technique is demonstrated as the lungs are adequately inflated with subtle linear density right midlung likely atelectasis and unchanged. Mild opacification over the right base unchanged likely small amount of pleural fluid/atelectasis. Minimal linear atelectasis left base. Mild stable cardiomegaly. Remainder of the exam is unchanged. IMPRESSION: Stable minimal bibasilar opacification likely bibasilar atelectasis with small amount right pleural fluid. Mild stable cardiomegaly. Electronically Signed   By: DMarin OlpM.D.   On: 12/27/2018 08:07   Dg Chest Port 1 View  Result Date: 12/26/2018 CLINICAL DATA:  Shortness of breath. EXAM: PORTABLE CHEST 1 VIEW COMPARISON:  CTA chest, 12/20/2018. Chest radiographs, 12/19/2018 and older exams. FINDINGS: Cardiac silhouette is borderline to mildly enlarged. Aorta shows atherosclerotic calcifications and is prominent. No mediastinal or hilar masses. There are linear/reticular lung base opacities, greater on the right, also noted in the right mid lung, likely due to atelectasis. Small pleural effusions are suggested, which were noted on the prior chest CT. Stable prominent bronchovascular markings. No convincing pneumonia or pulmonary edema. No pneumothorax. Skeletal structures are grossly intact. IMPRESSION: 1. No convincing pneumonia or pulmonary edema. 2. Lung base opacities on the right have mildly increased when compared to the prior chest radiographs, most consistent with atelectasis. Small pleural effusions stable compared to the prior chest CTA. Electronically Signed   By: DLajean ManesM.D.   On: 12/26/2018 09:42

## 2018-12-28 NOTE — Progress Notes (Signed)
RN facetime pt.'s granddaughter. No further concerns

## 2018-12-29 LAB — COMPREHENSIVE METABOLIC PANEL
ALT: 26 U/L (ref 0–44)
AST: 25 U/L (ref 15–41)
Albumin: 2.1 g/dL — ABNORMAL LOW (ref 3.5–5.0)
Alkaline Phosphatase: 104 U/L (ref 38–126)
Anion gap: 7 (ref 5–15)
BUN: 20 mg/dL (ref 8–23)
CO2: 23 mmol/L (ref 22–32)
Calcium: 8.4 mg/dL — ABNORMAL LOW (ref 8.9–10.3)
Chloride: 108 mmol/L (ref 98–111)
Creatinine, Ser: 1.31 mg/dL — ABNORMAL HIGH (ref 0.44–1.00)
GFR calc Af Amer: 45 mL/min — ABNORMAL LOW (ref >60)
GFR calc non Af Amer: 39 mL/min — ABNORMAL LOW (ref >60)
Glucose, Bld: 114 mg/dL — ABNORMAL HIGH (ref 70–99)
Potassium: 4.8 mmol/L (ref 3.5–5.1)
Sodium: 138 mmol/L (ref 135–145)
Total Bilirubin: 0.6 mg/dL (ref 0.3–1.2)
Total Protein: 6.1 g/dL — ABNORMAL LOW (ref 6.5–8.1)

## 2018-12-29 LAB — CBC WITH DIFFERENTIAL/PLATELET
Abs Immature Granulocytes: 0.02 10*3/uL (ref 0.00–0.07)
Basophils Absolute: 0 10*3/uL (ref 0.0–0.1)
Basophils Relative: 0 %
Eosinophils Absolute: 0 10*3/uL (ref 0.0–0.5)
Eosinophils Relative: 0 %
HCT: 23.4 % — ABNORMAL LOW (ref 36.0–46.0)
Hemoglobin: 7.3 g/dL — ABNORMAL LOW (ref 12.0–15.0)
Immature Granulocytes: 0 %
Lymphocytes Relative: 14 %
Lymphs Abs: 0.7 10*3/uL (ref 0.7–4.0)
MCH: 26.4 pg (ref 26.0–34.0)
MCHC: 31.2 g/dL (ref 30.0–36.0)
MCV: 84.8 fL (ref 80.0–100.0)
Monocytes Absolute: 0.4 10*3/uL (ref 0.1–1.0)
Monocytes Relative: 7 %
Neutro Abs: 4.3 10*3/uL (ref 1.7–7.7)
Neutrophils Relative %: 79 %
Platelets: 263 10*3/uL (ref 150–400)
RBC: 2.76 MIL/uL — ABNORMAL LOW (ref 3.87–5.11)
RDW: 19.4 % — ABNORMAL HIGH (ref 11.5–15.5)
WBC: 5.4 10*3/uL (ref 4.0–10.5)
nRBC: 0.6 % — ABNORMAL HIGH (ref 0.0–0.2)

## 2018-12-29 LAB — GLUCOSE, CAPILLARY
Glucose-Capillary: 105 mg/dL — ABNORMAL HIGH (ref 70–99)
Glucose-Capillary: 129 mg/dL — ABNORMAL HIGH (ref 70–99)
Glucose-Capillary: 106 mg/dL — ABNORMAL HIGH (ref 70–99)
Glucose-Capillary: 155 mg/dL — ABNORMAL HIGH (ref 70–99)

## 2018-12-29 LAB — BRAIN NATRIURETIC PEPTIDE: B Natriuretic Peptide: 206.8 pg/mL — ABNORMAL HIGH (ref 0.0–100.0)

## 2018-12-29 LAB — MAGNESIUM: Magnesium: 2 mg/dL (ref 1.7–2.4)

## 2018-12-29 LAB — TYPE AND SCREEN
ABO/RH(D): O POS
Antibody Screen: NEGATIVE

## 2018-12-29 LAB — D-DIMER, QUANTITATIVE: D-Dimer, Quant: 3.39 ug/mL-FEU — ABNORMAL HIGH (ref 0.00–0.50)

## 2018-12-29 LAB — C-REACTIVE PROTEIN: CRP: 9.4 mg/dL — ABNORMAL HIGH (ref <1.0)

## 2018-12-29 LAB — PROCALCITONIN: Procalcitonin: 2.13 ng/mL

## 2018-12-29 MED ORDER — FUROSEMIDE 10 MG/ML IJ SOLN
40.0000 mg | Freq: Once | INTRAMUSCULAR | Status: AC
  Administered 2018-12-29: 40 mg via INTRAVENOUS
  Filled 2018-12-29: qty 4

## 2018-12-29 MED ORDER — GUAIFENESIN ER 600 MG PO TB12
600.0000 mg | ORAL_TABLET | Freq: Two times a day (BID) | ORAL | Status: AC
  Administered 2018-12-29 – 2018-12-30 (×4): 600 mg via ORAL
  Filled 2018-12-29 (×4): qty 1

## 2018-12-29 NOTE — Progress Notes (Signed)
PROGRESS NOTE                                                                                                                                                                                                             Patient Demographics:    Katherine [name], is a 78 y.o. female, DOB - [birth year], OFH:[id number]  Outpatient Primary MD for the patient is Valerie Roys, DO    LOS - 8  Admit date - 12/21/2018    Chief complaint.  Nosebleed.     Brief Narrative -  Katherine [name]  is a 78 y.o. female, with history of recent PE diagnosed November 21, 2018 currently on Eliquis, generalized weakness and deconditioning, virtually bedbound and nursing home resident for several months, CKD 3, DM type II, hypertension, COPD, GERD, seizures, obesity, CVA who lives in a nursing home and initially presented to [facility] with chief complaints of nosebleeding.  She was found to have epistaxis and it was treated.  Subsequently testing suggested that she had COVID-19 infection and sent to [facility] for further care.  Patient is currently relatively symptom-free.  Denies any headache, no chest pain shortness of breath.  No productive cough.  No focal weakness which is new.   Subjective:   Patient in bed, appears comfortable, denies any headache, no fever, no chest pain or pressure, no shortness of breath but does have a cough, no abdominal pain. No focal weakness.   Assessment  & Plan :     1.  Acute Covid 19 Viral Pneumonitis during the ongoing 2020 Covid 19 Pandemic -mild disease currently on 1 L nasal cannula oxygen and can be titrated down to room air soon.  Continue IV steroids and remdesivir.  Monitor clinically.  Encouraged to sit up in chair and daytime use I-S and flutter valve for pulmonary toiletry and prone in bed at night if possible. Inflamm markers high likely from Asp PNA, since her CRP rose higher Decadron dose on 12/28/2018 thereafter  cut down again.   COVID-19 Labs  Recent Labs    12/27/18 0140 12/28/18 0254 12/29/18 0422  DDIMER 3.83* 0.96* 3.39*  CRP 17.7* 18.3* 9.4*    Lab Results  Component Value Date   SARSCOV2NAA POSITIVE (A) 12/19/2018   SARSCOV2NAA NOT DETECTED 11/24/2018   [place] NEGATIVE 11/21/2018   [place] NEGATIVE 09/06/2018  SpO2: 95 % O2 Flow Rate (L/min): 1 L/min  Hepatic Function Latest Ref Rng & Units 12/29/2018 12/28/2018 12/27/2018  Total Protein 6.5 - 8.1 g/dL 6.1(L) 6.0(L) 5.6(L)  Albumin 3.5 - 5.0 g/dL 2.1(L) 2.0(L) 1.9(L)  AST 15 - 41 U/L 25 24 25   ALT 0 - 44 U/L 26 23 23   Alk Phosphatase 38 - 126 U/L 104 103 117  Total Bilirubin 0.3 - 1.2 mg/dL 0.6 0.9 0.8        Component Value Date/Time   BNP 206.8 (H) 12/29/2018 0422    2.  Recent PE diagnosed October 2020.    Continue Eliquis.  Nosebleed seems to have resolved.  3.  History of ESBL UTI.  She was started on Invanz on 12/19/2018.  Currently no signs of active infection.  4.  Leukocytosis - Aspiration 11/12 + steroids.  5.  History of CVA.  Continue statin for secondary prevention along with anticoagulation with eliquis.  6.  Weakness, deconditioning, bedbound status.  Supportive care discharge back to SNF once better.  7.  GERD.  Switch PPI to Pepcid, rule out C. difficile first.  8.  Gastroenteritis.  Likely Covid related likely C. difficile colonized, PCR negative.  Diarrhea has completely resolved.  9.  CKD 2.  Baseline creatinine 1.2 will monitor.   10.   History of seizures.  On Keppra.  11.   Episode of unresponsiveness  on 12/24/2018 morning.  Patient has had 2 similar episodes in the past, deemed to be due to seizures, she had thorough neurological work-up less than 1 year ago for 2 similar episodes and they were thought to be seizure related. She was already on Eliquis hence not a candidate for TPA, last known time of normal exam unknown hence again not a candidate for TPA.  She  returned to normal within a few hours of Keppra.  Head CT unremarkable, EEG nonspecific.  Case was discussed with neurologist Dr. Lorraine Lax multiple times.  Kindly see note from 12/24/2018.  Patient had another breakthrough seizure-like episode morning of 12/26/2018, she threw up and had a bowel movement.  Case was discussed with neurologist Dr. Lorraine Lax again on 12/26/2018, Keppra dose has been increased from 500 mg to 1 g.  Will monitor if another breakthrough seizure then will add Vimpat as well.    12.  Anemia of chronic disease.  Monitoring.  Type screen done.  Some fall due to hemodilution.  Will monitor closely.   13. Aspiration PNA during second seizure episode on 12/26/2018 as she vomited.  Placed on Unasyn.  Supplemental oxygen and monitor.  Chest x-ray stable for now.  Trend pro calcitonin which is coming down serially & get BC if febrile.   Results for ELLIOT, SIMONEAUX (MRN [id number]) as of 12/29/2018 09:57  Ref. Range 12/26/2018 01:34 12/27/2018 01:40 12/28/2018 02:54 12/29/2018 04:22  Procalcitonin Latest Units: ng/mL <0.10 5.18 3.64 2.13    13. Recent episodes of epistaxis prompting this admission at [facility].  Resolved.    14. DM type II.  Continue Tradjenta, placed on sliding scale here.    Lab Results  Component Value Date   HGBA1C 5.1 12/21/2018   CBG (last 3)  Recent Labs    12/28/18 1648 12/28/18 2111 12/29/18 0756  GLUCAP 169* 163* 105*     Condition - Fair  Family Communication  : DW granddaughter in detail on 12/26/2018 explained guarded prognosis.    Discussed with husband in detail on 12/26/2018 he affirms that he is the primary decision  maker, he understands and agrees that his wife's baseline quality of life is very poor requiring multiple hospital admissions this year, he agrees that she should be DNR but that medical treatment should be continued.  Husband again on 12/27/18   Code Status : DNR  Diet :   Diet Order            DIET DYS 3 Room  service appropriate? Yes; Fluid consistency: Thin  Diet effective now               Disposition Plan  :  SNF   Consults  :  None  Procedures  :     PUD Prophylaxis : pepcid  DVT Prophylaxis  :   Lovenox >> Eliquis  Lab Results  Component Value Date   PLT 263 12/29/2018    Inpatient Medications  Scheduled Meds:  albuterol  2 puff Inhalation Q6H   apixaban  5 mg Oral Q12H   atorvastatin  80 mg Oral QPM   cholecalciferol  1,000 Units Oral Daily   dexamethasone (DECADRON) injection  4 mg Intravenous Q24H   docusate sodium  100 mg Oral BID   famotidine  40 mg Oral Daily   gabapentin  300 mg Oral QHS   guaiFENesin  600 mg Oral BID   hydrALAZINE  25 mg Oral TID   insulin aspart  0-5 Units Subcutaneous QHS   insulin aspart  0-9 Units Subcutaneous TID WC   linagliptin  5 mg Oral Daily   multivitamin with minerals  1 tablet Oral Daily   cyanocobalamin  1,000 mcg Oral Daily   Continuous Infusions:  ampicillin-sulbactam (UNASYN) IV 3 g (12/29/18 0626)   levETIRAcetam 1,000 mg (12/29/18 0858)   PRN Meds:.acetaminophen, chlorpheniramine-HYDROcodone, LORazepam, nitroGLYCERIN, [DISCONTINUED] ondansetron **OR** ondansetron (ZOFRAN) IV  Antibiotics  :    Anti-infectives (From admission, onward)   Start     Dose/Rate Route Frequency Ordered Stop   12/26/18 0945  Ampicillin-Sulbactam (UNASYN) 3 g in sodium chloride 0.9 % 100 mL IVPB     3 g 200 mL/hr over 30 Minutes Intravenous Every 8 hours 12/26/18 0932     12/21/18 1600  remdesivir 100 mg in sodium chloride 0.9 % 250 mL IVPB     100 mg 500 mL/hr over 30 Minutes Intravenous Every 24 hours 12/21/18 1454 12/24/18 1631       Time Spent in minutes  30   Lala Lund M.D on 12/29/2018 at 9:56 AM  To page go to [email or web address] - password [facility]  [facility] -  Office  [phone]    See all Orders from today for further details    Objective:   Vitals:   12/28/18 1600 12/28/18 2103 12/29/18  0325 12/29/18 0800  BP: 122/80 115/72 140/79 (!) 148/84  Pulse: 98 95 82 80  Resp: 19 19 15 17   Temp: (!) 97 F (36.1 C) 98.2 F (36.8 C) 98.3 F (36.8 C) 98.1 F (36.7 C)  TempSrc: Oral Oral Oral Oral  SpO2: 93% 94% 93% 95%  Weight:      Height:        Wt Readings from Last 3 Encounters:  12/27/18 102.5 kg  12/19/18 102.5 kg  11/21/18 104 kg     Intake/Output Summary (Last 24 hours) at 12/29/2018 0956 Last data filed at 12/29/2018 0600 Gross per 24 hour  Intake --  Output 600 ml  Net -600 ml     Physical Exam  Awake , comfortable, chronic right-sided deviation of both  eyes right more than left, 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    [place].AT  Supple Neck,No JVD, No cervical lymphadenopathy appriciated.  Symmetrical Chest wall movement, Good air movement bilaterally, CTAB RRR,No Gallops, Rubs or new Murmurs, No Parasternal Heave +ve B.Sounds, Abd Soft, No tenderness, No organomegaly appriciated, No rebound - guarding or rigidity. No Cyanosis, Clubbing or edema, No new Rash or bruise    Data Review:    CBC Recent Labs  Lab 12/25/18 0110 12/26/18 0105 12/27/18 0140 12/28/18 0254 12/29/18 0422  WBC 4.7 7.6 23.3* 11.9* 5.4  HGB 8.1* 8.4* 7.6* 7.6* 7.3*  HCT 25.7* 26.7* 23.6* 23.9* 23.4*  PLT 305 353 301 250 263  MCV 84.5 85.6 85.5 84.5 84.8  MCH 26.6 26.9 27.5 26.9 26.4  MCHC 31.5 31.5 32.2 31.8 31.2  RDW 20.3* 20.6* 20.2* 19.8* 19.4*  LYMPHSABS 1.2 1.9 1.3 0.6* 0.7  MONOABS 0.5 0.8 1.7* 0.5 0.4  EOSABS 0.0 0.0 0.0 0.0 0.0  BASOSABS 0.0 0.0 0.0 0.0 0.0    Chemistries  Recent Labs  Lab 12/25/18 0110 12/26/18 0105 12/27/18 0140 12/28/18 0254 12/29/18 0422  NA 137 133* 142 136 138  K 4.0 3.5 4.2 4.5 4.8  CL 108 105 112* 108 108  CO2 22 20* 21* 22 23  GLUCOSE 94 102* 112* 113* 114*  BUN 32* 30* 31* 23 20  CREATININE 1.51* 1.58* 1.68* 1.34* 1.31*  CALCIUM 8.0* 7.6* 7.8* 8.0* 8.4*  MG 1.9  1.9 1.9 1.8 2.0  AST 33 43* 25 24 25   ALT 23 30 23 23 26   ALKPHOS 165* 161* 117 103 104  BILITOT 0.7 0.4 0.8 0.9 0.6   ------------------------------------------------------------------------------------------------------------------ No results for input(s): CHOL, HDL, LDLCALC, TRIG, CHOLHDL, LDLDIRECT in the last 72 hours.  Lab Results  Component Value Date   HGBA1C 5.1 12/21/2018   ------------------------------------------------------------------------------------------------------------------ No results for input(s): TSH, T4TOTAL, T3FREE, THYROIDAB in the last 72 hours.  Invalid input(s): FREET3  Cardiac Enzymes No results for input(s): CKMB, TROPONINI, MYOGLOBIN in the last 168 hours.  Invalid input(s): CK ------------------------------------------------------------------------------------------------------------------    Component Value Date/Time   BNP 206.8 (H) 12/29/2018 0422    Micro Results Recent Results (from the past 240 hour(s))  SARS CORONAVIRUS 2 (TAT 6-24 HRS) Nasopharyngeal Nasopharyngeal Swab     Status: Abnormal   Collection Time: 12/19/18 11:24 PM   Specimen: Nasopharyngeal Swab  Result Value Ref Range Status   SARS Coronavirus 2 POSITIVE (A) NEGATIVE Final    Comment: RESULT CALLED TO, READ BACK BY AND VERIFIED WITH: Henderson Newcomer RN 15:20 12/20/18 ([email or web address]) (NOTE) SARS-CoV-2 target nucleic acids are DETECTED. The SARS-CoV-2 RNA is generally detectable in upper and lower respiratory specimens during the acute phase of infection. Positive results are indicative of active infection with SARS-CoV-2. Clinical  correlation with patient history and other diagnostic information is necessary to determine patient infection status. Positive results do  not rule out bacterial infection or co-infection with other viruses. The expected result is Negative. Fact Sheet for Patients: SugarRoll.be Fact Sheet for Healthcare  Providers: https://www.woods-mathews.com/ This test is not yet approved or cleared by the [place] FDA and  has been authorized for detection and/or diagnosis of SARS-CoV-2 by FDA under an Emergency Use Authorization (EUA). This EUA will remain  in effect (meaning this test can be used) for  the duration of the COVID-19 declaration under Section 564(b)(1) of the Act, 21 U.S.C. section 360bbb-3(b)(1), unless the authorization is terminated  or revoked sooner. Performed at [facility], [place] [address], [place], [place] [address]   MRSA PCR Screening     Status: None   Collection Time: 12/21/18  8:15 AM   Specimen: Nasal Mucosa; Nasopharyngeal  Result Value Ref Range Status   MRSA by PCR NEGATIVE NEGATIVE Final    Comment:        The GeneXpert MRSA Assay (FDA approved for NASAL specimens only), is one component of a comprehensive MRSA colonization surveillance program. It is not intended to diagnose MRSA infection nor to guide or monitor treatment for MRSA infections. Performed at [facility], [address] [place], [place] [address]   C difficile quick scan w PCR reflex     Status: Abnormal   Collection Time: 12/21/18  3:26 PM   Specimen: STOOL  Result Value Ref Range Status   C Diff antigen POSITIVE (A) NEGATIVE Final   C Diff toxin NEGATIVE NEGATIVE Final   C Diff interpretation Results are indeterminate. See PCR results.  Final    Comment: Performed at [facility], [place] [address] [place], [place] [address]  C. Diff by PCR, Reflexed     Status: None   Collection Time: 12/21/18  3:26 PM  Result Value Ref Range Status   Toxigenic C. Difficile by PCR NEGATIVE NEGATIVE Final    Comment: Patient is colonized with non toxigenic C. difficile. May not need treatment unless significant symptoms are present. Performed at [facility], [place] [address], [place], [place] [address]     Radiology Reports Dg Chest 2  View  Result Date: 12/19/2018 CLINICAL DATA:  Hemoptysis, cough EXAM: CHEST - 2 VIEW COMPARISON:  11/24/2018 FINDINGS: Increased markings in the lower lungs, likely scarring, similar to prior study. Heart is borderline in size. Aortic atherosclerosis. No acute airspace opacities or effusions. No acute bony abnormality. IMPRESSION: Bibasilar linear densities, likely scarring. No definite acute process. Electronically Signed   By: Rolm Baptise M.D.   On: 12/19/2018 20:59   Ct Head Wo Contrast  Result Date: 12/24/2018 CLINICAL DATA:  Altered level of consciousness EXAM: CT HEAD WITHOUT CONTRAST TECHNIQUE: Contiguous axial images were obtained from the base of the skull through the vertex without intravenous contrast. COMPARISON:  12/19/2018 FINDINGS: Brain: No evidence of acute infarction, hemorrhage, hydrocephalus, extra-axial collection or mass lesion/mass effect. Mild periventricular white matter hypodensity. Vascular: No hyperdense vessel or unexpected calcification. Skull: Normal. Negative for fracture or focal lesion. Sinuses/Orbits: No acute finding. Other: None. IMPRESSION: No acute intracranial pathology.  Small-vessel white matter disease. Electronically Signed   By: Eddie Candle M.D.   On: 12/24/2018 09:04   Ct Head Wo Contrast  Result Date: 12/19/2018 CLINICAL DATA:  Headache, on Eliquis EXAM: CT HEAD WITHOUT CONTRAST TECHNIQUE: Contiguous axial images were obtained from the base of the skull through the vertex without intravenous contrast. COMPARISON:  09/06/2018 FINDINGS: Brain: Patchy chronic small vessel disease throughout the deep white matter. No acute intracranial abnormality. Specifically, no hemorrhage, hydrocephalus, mass lesion, acute infarction, or significant intracranial injury. Vascular: No hyperdense vessel or unexpected calcification. Skull: No acute calvarial abnormality. Sinuses/Orbits: Visualized paranasal sinuses and mastoids clear. Orbital soft tissues unremarkable. Other:  None IMPRESSION: Chronic small vessel disease changes throughout the deep white matter. No acute intracranial abnormality. Electronically Signed   By: Rolm Baptise M.D.   On: 12/19/2018 21:00   Ct Angio Chest Pe W And/or Wo Contrast  Result Date: 12/20/2018 CLINICAL DATA:  Nose bleed.  Hemoptysis. EXAM: CT ANGIOGRAPHY CHEST WITH CONTRAST TECHNIQUE: Multidetector CT imaging of the chest was performed using the standard protocol during bolus administration of intravenous contrast. Multiplanar CT image reconstructions and MIPs were obtained to evaluate the vascular anatomy. CONTRAST:  12m OMNIPAQUE IOHEXOL 350 MG/ML SOLN COMPARISON:  11/21/2018 FINDINGS: Cardiovascular: Contrast injection is sufficient to demonstrate satisfactory opacification of the pulmonary arteries to the segmental level.Again identified are small subsegmental pulmonary emboli involving the left lower lobe, slightly improved from prior study. The main pulmonary artery is dilated measuring approximately 3.3 cm. Aortic calcifications are noted. The heart size is stable. Coronary artery calcifications are noted. Mediastinum/Nodes: --No mediastinal or hilar lymphadenopathy. --No axillary lymphadenopathy. --No supraclavicular lymphadenopathy. --Normal thyroid gland. --The esophagus is unremarkable Lungs/Pleura: There are worsening bibasilar airspace opacities. There is no pneumothorax. There are trace bilateral pleural effusions, right greater than left. The trachea is unremarkable. Upper Abdomen: No acute abnormality. Musculoskeletal: No chest wall abnormality. No acute or significant osseous findings. Review of the MIP images confirms the above findings. IMPRESSION: 1. Improving subsegmental pulmonary emboli involving the left lower lobe. 2. Worsening bibasilar airspace opacities which may represent atelectasis or infiltrates. 3. Trace bilateral pleural effusions. 4. Dilated main pulmonary artery which can be seen in patients with elevated  pulmonary artery pressures. Aortic Atherosclerosis (ICD10-I70.0). Electronically Signed   By: CConstance HolsterM.D.   On: 12/20/2018 01:09   Cxr Am  Result Date: 12/27/2018 CLINICAL DATA:  Shortness of breath. EXAM: PORTABLE CHEST 1 VIEW COMPARISON:  12/26/2018 FINDINGS: Lordotic technique is demonstrated as the lungs are adequately inflated with subtle linear density right midlung likely atelectasis and unchanged. Mild opacification over the right base unchanged likely small amount of pleural fluid/atelectasis. Minimal linear atelectasis left base. Mild stable cardiomegaly. Remainder of the exam is unchanged. IMPRESSION: Stable minimal bibasilar opacification likely bibasilar atelectasis with small amount right pleural fluid. Mild stable cardiomegaly. Electronically Signed   By: DMarin OlpM.D.   On: 12/27/2018 08:07   Dg Chest Port 1 View  Result Date: 12/26/2018 CLINICAL DATA:  Shortness of breath. EXAM: PORTABLE CHEST 1 VIEW COMPARISON:  CTA chest, 12/20/2018. Chest radiographs, 12/19/2018 and older exams. FINDINGS: Cardiac silhouette is borderline to mildly enlarged. Aorta shows atherosclerotic calcifications and is prominent. No mediastinal or hilar masses. There are linear/reticular lung base opacities, greater on the right, also noted in the right mid lung, likely due to atelectasis. Small pleural effusions are suggested, which were noted on the prior chest CT. Stable prominent bronchovascular markings. No convincing pneumonia or pulmonary edema. No pneumothorax. Skeletal structures are grossly intact. IMPRESSION: 1. No convincing pneumonia or pulmonary edema. 2. Lung base opacities on the right have mildly increased when compared to the prior chest radiographs, most consistent with atelectasis. Small pleural effusions stable compared to the prior chest CTA. Electronically Signed   By: DLajean ManesM.D.   On: 12/26/2018 09:42

## 2018-12-29 NOTE — Progress Notes (Signed)
RN spoke with daughter on the phone. No further concerns

## 2018-12-30 LAB — COMPREHENSIVE METABOLIC PANEL
ALT: 35 U/L (ref 0–44)
AST: 37 U/L (ref 15–41)
Albumin: 2.2 g/dL — ABNORMAL LOW (ref 3.5–5.0)
Alkaline Phosphatase: 115 U/L (ref 38–126)
Anion gap: 8 (ref 5–15)
BUN: 23 mg/dL (ref 8–23)
CO2: 25 mmol/L (ref 22–32)
Calcium: 8.8 mg/dL — ABNORMAL LOW (ref 8.9–10.3)
Chloride: 107 mmol/L (ref 98–111)
Creatinine, Ser: 1.42 mg/dL — ABNORMAL HIGH (ref 0.44–1.00)
GFR calc Af Amer: 41 mL/min — ABNORMAL LOW (ref >60)
GFR calc non Af Amer: 36 mL/min — ABNORMAL LOW (ref >60)
Glucose, Bld: 114 mg/dL — ABNORMAL HIGH (ref 70–99)
Potassium: 5 mmol/L (ref 3.5–5.1)
Sodium: 140 mmol/L (ref 135–145)
Total Bilirubin: 0.2 mg/dL — ABNORMAL LOW (ref 0.3–1.2)
Total Protein: 6.2 g/dL — ABNORMAL LOW (ref 6.5–8.1)

## 2018-12-30 LAB — CBC WITH DIFFERENTIAL/PLATELET
Abs Immature Granulocytes: 0.03 10*3/uL (ref 0.00–0.07)
Basophils Absolute: 0 10*3/uL (ref 0.0–0.1)
Basophils Relative: 0 %
Eosinophils Absolute: 0 10*3/uL (ref 0.0–0.5)
Eosinophils Relative: 0 %
HCT: 24.1 % — ABNORMAL LOW (ref 36.0–46.0)
Hemoglobin: 7.6 g/dL — ABNORMAL LOW (ref 12.0–15.0)
Immature Granulocytes: 1 %
Lymphocytes Relative: 16 %
Lymphs Abs: 0.8 10*3/uL (ref 0.7–4.0)
MCH: 26.5 pg (ref 26.0–34.0)
MCHC: 31.5 g/dL (ref 30.0–36.0)
MCV: 84 fL (ref 80.0–100.0)
Monocytes Absolute: 0.4 10*3/uL (ref 0.1–1.0)
Monocytes Relative: 8 %
Neutro Abs: 3.7 10*3/uL (ref 1.7–7.7)
Neutrophils Relative %: 75 %
Platelets: 272 10*3/uL (ref 150–400)
RBC: 2.87 MIL/uL — ABNORMAL LOW (ref 3.87–5.11)
RDW: 19 % — ABNORMAL HIGH (ref 11.5–15.5)
WBC: 4.9 10*3/uL (ref 4.0–10.5)
nRBC: 0.4 % — ABNORMAL HIGH (ref 0.0–0.2)

## 2018-12-30 LAB — BRAIN NATRIURETIC PEPTIDE: B Natriuretic Peptide: 211 pg/mL — ABNORMAL HIGH (ref 0.0–100.0)

## 2018-12-30 LAB — PROCALCITONIN: Procalcitonin: 1.28 ng/mL

## 2018-12-30 LAB — GLUCOSE, CAPILLARY
Glucose-Capillary: 94 mg/dL (ref 70–99)
Glucose-Capillary: 120 mg/dL — ABNORMAL HIGH (ref 70–99)
Glucose-Capillary: 109 mg/dL — ABNORMAL HIGH (ref 70–99)
Glucose-Capillary: 80 mg/dL (ref 70–99)
Glucose-Capillary: 143 mg/dL — ABNORMAL HIGH (ref 70–99)

## 2018-12-30 LAB — D-DIMER, QUANTITATIVE: D-Dimer, Quant: 1.99 ug/mL-FEU — ABNORMAL HIGH (ref 0.00–0.50)

## 2018-12-30 LAB — MAGNESIUM: Magnesium: 2 mg/dL (ref 1.7–2.4)

## 2018-12-30 LAB — C-REACTIVE PROTEIN: CRP: 4.2 mg/dL — ABNORMAL HIGH (ref <1.0)

## 2018-12-30 MED ORDER — DEXAMETHASONE SODIUM PHOSPHATE 4 MG/ML IJ SOLN: 2.0000 mg | INTRAMUSCULAR | Status: DC

## 2018-12-30 MED ORDER — DEXAMETHASONE SODIUM PHOSPHATE 4 MG/ML IJ SOLN
1.0000 mg | INTRAMUSCULAR | Status: DC
  Administered 2018-12-30: 1 mg via INTRAVENOUS
  Filled 2018-12-30: qty 1

## 2018-12-30 MED ORDER — SODIUM CHLORIDE 0.9 % IV SOLN
100.0000 mg | Freq: Two times a day (BID) | INTRAVENOUS | Status: DC
  Filled 2018-12-30: qty 10

## 2018-12-30 MED ORDER — AMOXICILLIN-POT CLAVULANATE 500-125 MG PO TABS: 1.0000 | ORAL_TABLET | Freq: Three times a day (TID) | ORAL | Status: DC

## 2018-12-30 MED ORDER — SODIUM CHLORIDE 0.9 % IV SOLN
200.0000 mg | INTRAVENOUS | Status: AC
  Administered 2018-12-30: 200 mg via INTRAVENOUS
  Filled 2018-12-30: qty 20

## 2018-12-30 MED ORDER — AMOXICILLIN-POT CLAVULANATE 500-125 MG PO TABS
1.0000 | ORAL_TABLET | Freq: Three times a day (TID) | ORAL | Status: DC
  Administered 2018-12-30 – 2018-12-31 (×4): 500 mg via ORAL
  Filled 2018-12-30 (×7): qty 1

## 2018-12-30 MED ORDER — LACOSAMIDE 50 MG PO TABS
100.0000 mg | ORAL_TABLET | Freq: Two times a day (BID) | ORAL | Status: DC
  Administered 2018-12-30 – 2018-12-31 (×2): 100 mg via ORAL
  Filled 2018-12-30 (×2): qty 2

## 2018-12-30 MED ORDER — SODIUM CHLORIDE 0.9 % IV SOLN: 100.0000 mg | Freq: Two times a day (BID) | INTRAVENOUS | Status: DC

## 2018-12-30 MED ORDER — LEVETIRACETAM ER 500 MG PO TB24
1000.0000 mg | ORAL_TABLET | Freq: Every day | ORAL | Status: DC
  Administered 2018-12-31: 1000 mg via ORAL
  Filled 2018-12-30 (×2): qty 2

## 2018-12-30 NOTE — Progress Notes (Signed)
PROGRESS NOTE                                                                                                                                                                                                             Patient Demographics:    Katherine [name], is a 78 y.o. female, DOB - Apr 29, 1940, PFX:[id number]  Outpatient Primary MD for the patient is Valerie Roys, DO    LOS - 9  Admit date - 12/21/2018    Chief complaint.  Nosebleed.     Brief Narrative -  Katherine [name]  is a 78 y.o. female, with history of recent PE diagnosed November 21, 2018 currently on Eliquis, generalized weakness and deconditioning, virtually bedbound and nursing home resident for several months, CKD 3, DM type II, hypertension, COPD, GERD, seizures, obesity, CVA who lives in a nursing home and initially presented to [facility] ER with chief complaints of nosebleeding.  She was found to have epistaxis and it was treated.  Subsequently testing suggested that she had COVID-19 infection and sent to [facility] for further care.  Patient is currently relatively symptom-free.  Denies any headache, no chest pain shortness of breath.  No productive cough.  No focal weakness which is new.   Subjective:   Patient in bed appears comfortable but slightly more sleepy today, denies any headache or weakness.  Says she wants to eat breakfast, says she wants egg cheese and grits.   Assessment  & Plan :     1.  Acute Covid 19 Viral Pneumonitis during the ongoing 2020 Covid 19 Pandemic -mild disease currently on 1 L nasal cannula oxygen and can be titrated down to room air soon.  Continue IV steroids and remdesivir.  Monitor clinically.  Encouraged to sit up in chair and daytime use I-S and flutter valve for pulmonary toiletry and prone in bed at night if possible. Inflamm markers high likely from Asp PNA, since her CRP rose higher Decadron dose on 12/28/2018 thereafter cut down  again.   COVID-19 Labs  Recent Labs    12/28/18 0254 12/29/18 0422 12/30/18 0320  DDIMER 0.96* 3.39* 1.99*  CRP 18.3* 9.4* 4.2*    Lab Results  Component Value Date   SARSCOV2NAA POSITIVE (A) 12/19/2018   SARSCOV2NAA NOT DETECTED 11/24/2018   [place] NEGATIVE 11/21/2018   [place] NEGATIVE 09/06/2018  SpO2: 94 % O2 Flow Rate (L/min): 1 L/min  Hepatic Function Latest Ref Rng & Units 12/30/2018 12/29/2018 12/28/2018  Total Protein 6.5 - 8.1 g/dL 6.2(L) 6.1(L) 6.0(L)  Albumin 3.5 - 5.0 g/dL 2.2(L) 2.1(L) 2.0(L)  AST 15 - 41 U/L 37 25 24  ALT 0 - 44 U/L 35 26 23  Alk Phosphatase 38 - 126 U/L 115 104 103  Total Bilirubin 0.3 - 1.2 mg/dL 0.2(L) 0.6 0.9        Component Value Date/Time   BNP 211.0 (H) 12/30/2018 0320    2.  Recent PE diagnosed October 2020.    Continue Eliquis.  Nosebleed seems to have resolved.  3.  History of ESBL UTI.  She was started on Invanz on 12/19/2018.  Currently no signs of active infection.  4.  Leukocytosis - Aspiration 11/12 + steroids.  5.  History of CVA.  Continue statin for secondary prevention along with anticoagulation with eliquis.  6.  Weakness, deconditioning, bedbound status.  Supportive care discharge back to SNF once better.  7.  GERD.  Switch PPI to Pepcid, rule out C. difficile first.  8.  Gastroenteritis.  Likely Covid related likely C. difficile colonized, PCR negative.  Diarrhea has completely resolved.  9.  CKD 2.  Baseline creatinine 1.2 will monitor.   10.   History of seizures.  On Keppra.  11.   Episode of unresponsiveness  on 12/24/2018 morning.  Patient has had 2 similar episodes in the past, deemed to be due to seizures, she had thorough neurological work-up less than 1 year ago for 2 similar episodes and they were thought to be seizure related. She was already on Eliquis hence not a candidate for TPA, last known time of normal exam unknown hence again not a candidate for TPA.  She returned to  normal within a few hours of Keppra.  Head CT unremarkable, EEG nonspecific.  Case was discussed with neurologist Dr. Lorraine Lax multiple times.  Kindly see note from 12/24/2018.  Patient had another breakthrough seizure-like episode morning of 12/26/2018, she threw up and had a bowel movement.  Case was discussed with neurologist Dr. Lorraine Lax again on 12/26/2018, Keppra dose has been increased from 500 mg to 1 g.  Unfortunately on 12/30/2018 morning she appears slightly postictal I think early in the morning while in sleep she might have had another breakthrough seizure, although she seems now close to her baseline however she is not quite at it yet.  Discussed with Dr. Cheral Marker neurologist on 12/30/2018.  Vimpat has been added.  We will continue to monitor closely.   12.  Anemia of chronic disease.  Monitoring.  Type screen done.  Some fall due to hemodilution.  Will monitor closely.   13. Aspiration PNA during second seizure episode on 12/26/2018 as she vomited.  Placed on Unasyn.  Supplemental oxygen and monitor.  Chest x-ray stable for now.  Trend pro calcitonin which is coming down serially & get BC if febrile.  Results for CLYDIA, NIEVES (MRN [id number]) as of 12/30/2018 11:52  Ref. Range 12/26/2018 01:34 12/27/2018 01:40 12/28/2018 02:54 12/29/2018 04:22 12/30/2018 03:20  Procalcitonin Latest Units: ng/mL <0.10 5.18 3.64 2.13 1.28    14. Recent episodes of epistaxis prompting this admission at [facility].  Resolved.    15. DM type II.  Continue Tradjenta, placed on sliding scale here.    Lab Results  Component Value Date   HGBA1C 5.1 12/21/2018   CBG (last 3)  Recent Labs    12/29/18  1654 12/29/18 2114 12/30/18 0800  GLUCAP 106* 155* 94     Condition - Fair  Family Communication  : DW granddaughter in detail on 12/26/2018 explained guarded prognosis.    Discussed with husband in detail on 12/26/2018 he affirms that he is the primary decision maker, he understands and agrees that his  wife's baseline quality of life is very poor requiring multiple hospital admissions this year, he agrees that she should be DNR but that medical treatment should be continued.  Husband again on 12/27/18   Code Status : DNR  Diet :   Diet Order            DIET DYS 3 Room service appropriate? Yes; Fluid consistency: Thin  Diet effective now               Disposition Plan  :  SNF   Consults  :  None  Procedures  :     PUD Prophylaxis : pepcid  DVT Prophylaxis  :   Lovenox >> Eliquis  Lab Results  Component Value Date   PLT 272 12/30/2018    Inpatient Medications  Scheduled Meds: . albuterol  2 puff Inhalation Q6H  . apixaban  5 mg Oral Q12H  . atorvastatin  80 mg Oral QPM  . cholecalciferol  1,000 Units Oral Daily  . dexamethasone (DECADRON) injection  2 mg Intravenous Q24H  . docusate sodium  100 mg Oral BID  . famotidine  40 mg Oral Daily  . gabapentin  300 mg Oral QHS  . guaiFENesin  600 mg Oral BID  . hydrALAZINE  25 mg Oral TID  . insulin aspart  0-5 Units Subcutaneous QHS  . insulin aspart  0-9 Units Subcutaneous TID WC  . linagliptin  5 mg Oral Daily  . multivitamin with minerals  1 tablet Oral Daily  . cyanocobalamin  1,000 mcg Oral Daily   Continuous Infusions: . ampicillin-sulbactam (UNASYN) IV Stopped (12/30/18 0710)  . lacosamide (VIMPAT) IV    . lacosamide (VIMPAT) IV    . levETIRAcetam Stopped (12/30/18 0920)   PRN Meds:.acetaminophen, chlorpheniramine-HYDROcodone, LORazepam, nitroGLYCERIN, [DISCONTINUED] ondansetron **OR** ondansetron (ZOFRAN) IV  Antibiotics  :    Anti-infectives (From admission, onward)   Start     Dose/Rate Route Frequency Ordered Stop   12/26/18 0945  Ampicillin-Sulbactam (UNASYN) 3 g in sodium chloride 0.9 % 100 mL IVPB     3 g 200 mL/hr over 30 Minutes Intravenous Every 8 hours 12/26/18 0932 01/01/19 2359   12/21/18 1600  remdesivir 100 mg in sodium chloride 0.9 % 250 mL IVPB     100 mg 500 mL/hr over 30 Minutes  Intravenous Every 24 hours 12/21/18 1454 12/24/18 1631       Time Spent in minutes  30   Lala Lund M.D on 12/30/2018 at 11:50 AM  To page go to [email or web address] - password [email or web address]  [facility] -  Office  [phone]    See all Orders from today for further details    Objective:   Vitals:   12/29/18 2022 12/30/18 0341 12/30/18 0842 12/30/18 0900  BP: 116/81 114/70 139/89   Pulse: 93 72 77 66  Resp: _0 Temp:  97.8 F (36.6 C) 98.1 F (36.7 C)   TempSrc:  Oral Oral   SpO2: 94% 94%  94%  Weight:      Height:        Wt Readings from Last 3 Encounters:  12/27/18 102.5 kg  12/19/18 102.5  kg  11/21/18 104 kg     Intake/Output Summary (Last 24 hours) at 12/30/2018 1150 Last data filed at 12/30/2018 0900 Gross per 24 hour  Intake 1240 ml  Output 2850 ml  Net -1610 ml     Physical Exam  Arousable, answers basic questions and follows basic commands, but overall appears slightly somnolent, chronic right-sided deviation of both eyes right more than left, 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    [place].AT  Supple Neck,No JVD, No cervical lymphadenopathy appriciated.  Symmetrical Chest wall movement, Good air movement bilaterally, CTAB RRR,No Gallops, Rubs or new Murmurs, No Parasternal Heave +ve B.Sounds, Abd Soft, No tenderness, No organomegaly appriciated, No rebound - guarding or rigidity. No Cyanosis, Clubbing or edema, No new Rash or bruise    Data Review:    CBC Recent Labs  Lab 12/26/18 0105 12/27/18 0140 12/28/18 0254 12/29/18 0422 12/30/18 0320  WBC 7.6 23.3* 11.9* 5.4 4.9  HGB 8.4* 7.6* 7.6* 7.3* 7.6*  HCT 26.7* 23.6* 23.9* 23.4* 24.1*  PLT 353 301 250 263 272  MCV 85.6 85.5 84.5 84.8 84.0  MCH 26.9 27.5 26.9 26.4 26.5  MCHC 31.5 32.2 31.8 31.2 31.5  RDW 20.6* 20.2* 19.8* 19.4* 19.0*  LYMPHSABS 1.9 1.3 0.6* 0.7 0.8  MONOABS 0.8 1.7* 0.5 0.4 0.4  EOSABS 0.0 0.0 0.0 0.0 0.0   BASOSABS 0.0 0.0 0.0 0.0 0.0    Chemistries  Recent Labs  Lab 12/26/18 0105 12/27/18 0140 12/28/18 0254 12/29/18 0422 12/30/18 0320  NA 133* 142 136 138 140  K 3.5 4.2 4.5 4.8 5.0  CL 105 112* 108 108 107  CO2 20* 21* _0 GLUCOSE 102* 112* 113* 114* 114*  BUN 30* 31* _1 CREATININE 1.58* 1.68* 1.34* 1.31* 1.42*  CALCIUM 7.6* 7.8* 8.0* 8.4* 8.8*  MG 1.9 1.9 1.8 2.0 2.0  AST 43* _2 37  ALT _3 35  ALKPHOS 161* 117 103 104 115  BILITOT 0.4 0.8 0.9 0.6 0.2*   ------------------------------------------------------------------------------------------------------------------ No results for input(s): CHOL, HDL, LDLCALC, TRIG, CHOLHDL, LDLDIRECT in the last 72 hours.  Lab Results  Component Value Date   HGBA1C 5.1 12/21/2018   ------------------------------------------------------------------------------------------------------------------ No results for input(s): TSH, T4TOTAL, T3FREE, THYROIDAB in the last 72 hours.  Invalid input(s): FREET3  Cardiac Enzymes No results for input(s): CKMB, TROPONINI, MYOGLOBIN in the last 168 hours.  Invalid input(s): CK ------------------------------------------------------------------------------------------------------------------    Component Value Date/Time   BNP 211.0 (H) 12/30/2018 0320    Micro Results Recent Results (from the past 240 hour(s))  MRSA PCR Screening     Status: None   Collection Time: 12/21/18  8:15 AM   Specimen: Nasal Mucosa; Nasopharyngeal  Result Value Ref Range Status   MRSA by PCR NEGATIVE NEGATIVE Final    Comment:        The GeneXpert MRSA Assay (FDA approved for NASAL specimens only), is one component of a comprehensive MRSA colonization surveillance program. It is not intended to diagnose MRSA infection nor to guide or monitor treatment for MRSA infections. Performed at [facility], [address] [place], [place] [address]   C difficile quick scan w PCR  reflex     Status: Abnormal   Collection Time: 12/21/18  3:26 PM   Specimen: STOOL  Result Value Ref Range Status   C Diff antigen POSITIVE (A) NEGATIVE Final   C Diff toxin NEGATIVE NEGATIVE  Final   C Diff interpretation Results are indeterminate. See PCR results.  Final    Comment: Performed at [facility], [place] [address], [place], [place] [address]  C. Diff by PCR, Reflexed     Status: None   Collection Time: 12/21/18  3:26 PM  Result Value Ref Range Status   Toxigenic C. Difficile by PCR NEGATIVE NEGATIVE Final    Comment: Patient is colonized with non toxigenic C. difficile. May not need treatment unless significant symptoms are present. Performed at [facility], [place] [address] [place], [place] [address]     Radiology Reports Dg Chest 2 View  Result Date: 12/19/2018 CLINICAL DATA:  Hemoptysis, cough EXAM: CHEST - 2 VIEW COMPARISON:  11/24/2018 FINDINGS: Increased markings in the lower lungs, likely scarring, similar to prior study. Heart is borderline in size. Aortic atherosclerosis. No acute airspace opacities or effusions. No acute bony abnormality. IMPRESSION: Bibasilar linear densities, likely scarring. No definite acute process. Electronically Signed   By: Rolm Baptise M.D.   On: 12/19/2018 20:59   Ct Head Wo Contrast  Result Date: 12/24/2018 CLINICAL DATA:  Altered level of consciousness EXAM: CT HEAD WITHOUT CONTRAST TECHNIQUE: Contiguous axial images were obtained from the base of the skull through the vertex without intravenous contrast. COMPARISON:  12/19/2018 FINDINGS: Brain: No evidence of acute infarction, hemorrhage, hydrocephalus, extra-axial collection or mass lesion/mass effect. Mild periventricular white matter hypodensity. Vascular: No hyperdense vessel or unexpected calcification. Skull: Normal. Negative for fracture or focal lesion. Sinuses/Orbits: No acute finding. Other: None. IMPRESSION: No acute intracranial pathology.  Small-vessel  white matter disease. Electronically Signed   By: Eddie Candle M.D.   On: 12/24/2018 09:04   Ct Head Wo Contrast  Result Date: 12/19/2018 CLINICAL DATA:  Headache, on Eliquis EXAM: CT HEAD WITHOUT CONTRAST TECHNIQUE: Contiguous axial images were obtained from the base of the skull through the vertex without intravenous contrast. COMPARISON:  09/06/2018 FINDINGS: Brain: Patchy chronic small vessel disease throughout the deep white matter. No acute intracranial abnormality. Specifically, no hemorrhage, hydrocephalus, mass lesion, acute infarction, or significant intracranial injury. Vascular: No hyperdense vessel or unexpected calcification. Skull: No acute calvarial abnormality. Sinuses/Orbits: Visualized paranasal sinuses and mastoids clear. Orbital soft tissues unremarkable. 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 No acute intracranial abnormality. Electronically Signed   By: Rolm Baptise M.D.   On: 12/19/2018 21:00   Ct Angio Chest Pe W And/or Wo Contrast  Result Date: 12/20/2018 CLINICAL DATA:  Nose bleed.  Hemoptysis. EXAM: CT ANGIOGRAPHY CHEST WITH CONTRAST TECHNIQUE: Multidetector CT imaging of the chest was performed using the standard protocol during bolus administration of intravenous contrast. Multiplanar CT image reconstructions and MIPs were obtained to evaluate the vascular anatomy. CONTRAST:  55m OMNIPAQUE IOHEXOL 350 MG/ML SOLN COMPARISON:  11/21/2018 FINDINGS: Cardiovascular: Contrast injection is sufficient to demonstrate satisfactory opacification of the pulmonary arteries to the segmental level.Again identified are small subsegmental pulmonary emboli involving the left lower lobe, slightly improved from prior study. The main pulmonary artery is dilated measuring approximately 3.3 cm. Aortic calcifications are noted. The heart size is stable. Coronary artery calcifications are noted. Mediastinum/Nodes: --No mediastinal or hilar  lymphadenopathy. --No axillary lymphadenopathy. --No supraclavicular lymphadenopathy. --Normal thyroid gland. --The esophagus is unremarkable Lungs/Pleura: There are worsening bibasilar airspace opacities. There is no pneumothorax. There are trace bilateral pleural effusions, right greater than left. The trachea is unremarkable. Upper Abdomen: No acute abnormality. Musculoskeletal: No chest wall abnormality. No acute  or significant osseous findings. Review of the MIP images confirms the above findings. IMPRESSION: 1. Improving subsegmental pulmonary emboli involving the left lower lobe. 2. Worsening bibasilar airspace opacities which may represent atelectasis or infiltrates. 3. Trace bilateral pleural effusions. 4. Dilated main pulmonary artery which can be seen in patients with elevated pulmonary artery pressures. Aortic Atherosclerosis (ICD10-I70.0). Electronically Signed   By: Constance Holster M.D.   On: 12/20/2018 01:09   Cxr Am  Result Date: 12/27/2018 CLINICAL DATA:  Shortness of breath. EXAM: PORTABLE CHEST 1 VIEW COMPARISON:  12/26/2018 FINDINGS: Lordotic technique is demonstrated as the lungs are adequately inflated with subtle linear density right midlung likely atelectasis and unchanged. Mild opacification over the right base unchanged likely small amount of pleural fluid/atelectasis. Minimal linear atelectasis left base. Mild stable cardiomegaly. Remainder of the exam is unchanged. IMPRESSION: Stable minimal bibasilar opacification likely bibasilar atelectasis with small amount right pleural fluid. Mild stable cardiomegaly. Electronically Signed   By: Marin Olp M.D.   On: 12/27/2018 08:07   Dg Chest Port 1 View  Result Date: 12/26/2018 CLINICAL DATA:  Shortness of breath. EXAM: PORTABLE CHEST 1 VIEW COMPARISON:  CTA chest, 12/20/2018. Chest radiographs, 12/19/2018 and older exams. FINDINGS: Cardiac silhouette is borderline to mildly enlarged. Aorta shows atherosclerotic calcifications and  is prominent. No mediastinal or hilar masses. There are linear/reticular lung base opacities, greater on the right, also noted in the right mid lung, likely due to atelectasis. Small pleural effusions are suggested, which were noted on the prior chest CT. Stable prominent bronchovascular markings. No convincing pneumonia or pulmonary edema. No pneumothorax. Skeletal structures are grossly intact. IMPRESSION: 1. No convincing pneumonia or pulmonary edema. 2. Lung base opacities on the right have mildly increased when compared to the prior chest radiographs, most consistent with atelectasis. Small pleural effusions stable compared to the prior chest CTA. Electronically Signed   By: Lajean Manes M.D.   On: 12/26/2018 09:42

## 2018-12-30 NOTE — Progress Notes (Signed)
Pharmacy Antibiotic Note  Katherine [name] is a 78 y.o. female admitted on 12/21/2018 with concern for aspiration PNA after vomiting with seizures on 11/12. Pharmacy has been consulted for Unasyn dosing.  Today is Unasyn D#5. The patient is afebrile and WBC is wnl. Discussed LOT with MD who wants 7 days - will add a stop date.   Plan: - Continue Ampicillin-sulbactam 3gm IV q8h thru 11/18 - Will continue to follow renal function, culture results  Height: 5\' 3"  (160 cm) Weight: 225 lb 15.5 oz (102.5 kg) IBW/kg (Calculated) : 52.4  Temp (24hrs), Avg:98.1 F (36.7 C), Min:97.8 F (36.6 C), Max:98.6 F (37 C)  Recent Labs  Lab 12/26/18 0105 12/27/18 0140 12/28/18 0254 12/29/18 0422 12/30/18 0320  WBC 7.6 23.3* 11.9* 5.4 4.9  CREATININE 1.58* 1.68* 1.34* 1.31* 1.42*    Estimated Creatinine Clearance: 37.9 mL/min (A) (by C-G formula based on SCr of 1.42 mg/dL (H)).    Allergies  Allergen Reactions  . Bee Venom Swelling  . Enalapril Maleate Swelling    Antimicrobials this admission: 11/12 Unasyn >>    Thank you for allowing pharmacy to be a part of this patient's care.  Alycia Rossetti, PharmD, BCPS Clinical Pharmacist Clinical phone for 12/30/2018: [phone] 12/30/2018 10:34 AM   **Pharmacist phone directory can now be found on [email or web address] ([email or web address]).  Listed under [place].

## 2018-12-31 LAB — COMPREHENSIVE METABOLIC PANEL
ALT: 38 U/L (ref 0–44)
AST: 39 U/L (ref 15–41)
Albumin: 2.1 g/dL — ABNORMAL LOW (ref 3.5–5.0)
Alkaline Phosphatase: 99 U/L (ref 38–126)
Anion gap: 8 (ref 5–15)
BUN: 26 mg/dL — ABNORMAL HIGH (ref 8–23)
CO2: 25 mmol/L (ref 22–32)
Calcium: 8.6 mg/dL — ABNORMAL LOW (ref 8.9–10.3)
Chloride: 105 mmol/L (ref 98–111)
Creatinine, Ser: 1.3 mg/dL — ABNORMAL HIGH (ref 0.44–1.00)
GFR calc Af Amer: 46 mL/min — ABNORMAL LOW (ref >60)
GFR calc non Af Amer: 40 mL/min — ABNORMAL LOW (ref >60)
Glucose, Bld: 67 mg/dL — ABNORMAL LOW (ref 70–99)
Potassium: 4.6 mmol/L (ref 3.5–5.1)
Sodium: 138 mmol/L (ref 135–145)
Total Bilirubin: 0.4 mg/dL (ref 0.3–1.2)
Total Protein: 5.8 g/dL — ABNORMAL LOW (ref 6.5–8.1)

## 2018-12-31 LAB — CBC WITH DIFFERENTIAL/PLATELET
Abs Immature Granulocytes: 0.06 10*3/uL (ref 0.00–0.07)
Basophils Absolute: 0 10*3/uL (ref 0.0–0.1)
Basophils Relative: 0 %
Eosinophils Absolute: 0 10*3/uL (ref 0.0–0.5)
Eosinophils Relative: 0 %
HCT: 23.9 % — ABNORMAL LOW (ref 36.0–46.0)
Hemoglobin: 7.5 g/dL — ABNORMAL LOW (ref 12.0–15.0)
Immature Granulocytes: 1 %
Lymphocytes Relative: 25 %
Lymphs Abs: 1.6 10*3/uL (ref 0.7–4.0)
MCH: 26.2 pg (ref 26.0–34.0)
MCHC: 31.4 g/dL (ref 30.0–36.0)
MCV: 83.6 fL (ref 80.0–100.0)
Monocytes Absolute: 0.8 10*3/uL (ref 0.1–1.0)
Monocytes Relative: 12 %
Neutro Abs: 3.9 10*3/uL (ref 1.7–7.7)
Neutrophils Relative %: 62 %
Platelets: 237 10*3/uL (ref 150–400)
RBC: 2.86 MIL/uL — ABNORMAL LOW (ref 3.87–5.11)
RDW: 19.5 % — ABNORMAL HIGH (ref 11.5–15.5)
WBC: 6.4 10*3/uL (ref 4.0–10.5)
nRBC: 0.3 % — ABNORMAL HIGH (ref 0.0–0.2)

## 2018-12-31 LAB — GLUCOSE, CAPILLARY
Glucose-Capillary: 79 mg/dL (ref 70–99)
Glucose-Capillary: 92 mg/dL (ref 70–99)

## 2018-12-31 LAB — D-DIMER, QUANTITATIVE: D-Dimer, Quant: 2.24 ug/mL-FEU — ABNORMAL HIGH (ref 0.00–0.50)

## 2018-12-31 LAB — C-REACTIVE PROTEIN: CRP: 2.1 mg/dL — ABNORMAL HIGH (ref <1.0)

## 2018-12-31 LAB — MAGNESIUM: Magnesium: 2 mg/dL (ref 1.7–2.4)

## 2018-12-31 LAB — BRAIN NATRIURETIC PEPTIDE: B Natriuretic Peptide: 186.7 pg/mL — ABNORMAL HIGH (ref 0.0–100.0)

## 2018-12-31 MED ORDER — LACOSAMIDE 100 MG PO TABS: 100.0000 mg | ORAL_TABLET | Freq: Two times a day (BID) | ORAL | Status: DC

## 2018-12-31 MED ORDER — AMOXICILLIN-POT CLAVULANATE 500-125 MG PO TABS: 1.0000 | ORAL_TABLET | Freq: Three times a day (TID) | ORAL | Status: AC

## 2018-12-31 MED ORDER — FUROSEMIDE 10 MG/ML IJ SOLN
20.0000 mg | Freq: Once | INTRAMUSCULAR | Status: AC
  Administered 2018-12-31: 20 mg via INTRAVENOUS
  Filled 2018-12-31: qty 2

## 2018-12-31 MED ORDER — LEVETIRACETAM ER 500 MG PO TB24: 1000.0000 mg | ORAL_TABLET | Freq: Every day | ORAL | Status: DC

## 2018-12-31 NOTE — Discharge Planning (Signed)
Discharge to [facility] today via [facility] transport service pending. Phone report given to Roxy Horseman, receiving nurse at the facility. PIVs 2x and telemetry removed. Refusing lunch at present.

## 2018-12-31 NOTE — TOC Transition Note (Signed)
Transition of Care [facility]) - CM/SW Discharge Note   Patient Details  Name: Katherine [name] MRN: [id number] Date of Birth: Feb 11, 1941  Transition of [facility]) CM/SW Contact:  Ninfa Meeker, RN Phone Number: [phone] (working remotely) 12/31/2018, 12:37 PM   Clinical Narrative:    Patient will DC to: [facility] date: 12/31/18 Family notified: Spouse: Bennie Hind Transport by: Corey Harold  1:30pm  Patient is medically ready for discharge to [facility]. Case manager confirmed with Claiborne Billings at [facility] that they are able to accept patient today. Patient's spouse, bedside RN, charge nurse and facility are notifed of discharge plan. Discharge summary and FL2 have been sent via the hub to [facility]. Bedside RN to call report to: [phone], patient will go to room 18B.  Ambulance transport has been requested for 1:30pm.  Final next level of care: Skilled Nursing Facility Barriers to Discharge: No Barriers Identified   Patient Goals and CMS Choice Patient states their goals for this hospitalization and ongoing recovery are:: spoke to spouse who would like patient to return to SNF when able to do so CMS Medicare.gov Compare Post Acute Care list provided to:: Other (Comment Required) Choice offered to / list presented to : Spouse  Discharge Placement                       Discharge Plan and Services     Post Acute Care Choice: Skilled Nursing Facility                               Social Determinants of Health (SDOH) Interventions     Readmission Risk Interventions Readmission Risk Prevention Plan 11/26/2018 11/22/2018 07/16/2018  Transportation Screening Complete Complete Complete  Medication Review Press photographer) Complete Complete Complete  PCP or Specialist appointment within 3-5 days of discharge (No Data) - Complete  PCP/Specialist Appt Not Complete comments - - -  HRI or Home Care Consult (No Data) - Complete  SW Recovery  Care/Counseling Consult Complete - Not Complete  SW Consult Not Complete Comments - - na  Palliative Care Screening - - Not Applicable  Medication Reconcilation ([place] Complete Complete Complete  Some recent data might be hidden

## 2018-12-31 NOTE — Care Management Important Message (Signed)
Important Message  Patient Details  Name: Katherine [name] MRN: [id number] Date of Birth: [birth year]   Medicare Important Message Given:  Yes - Important Message mailed due to current National Emergency  Verbal consent obtained due to current National Emergency  Relationship to patient: Spouse/Significant Other Contact Name: Bennie Hind Call Date: 12/31/18  Time: 1412 Phone: [phone] Outcome: No Answer/Busy Important Message mailed to: Patient address on file    Delorse Lek 12/31/2018, 2:12 PM

## 2018-12-31 NOTE — Plan of Care (Signed)
General understanding of infection control and respiratory complications related to covid 19 discussed with pt. Covid 19 guidance for persons under investigation signed.

## 2018-12-31 NOTE — Discharge Summary (Signed)
Katherine [name] [id number] DOB: 09-08-1940 DOA: 12/21/2018  PCP: Valerie Roys, DO  Admit date: 12/21/2018  Discharge date: 12/31/2018  Admitted From: SNF  Disposition:  SNF   Recommendations for Outpatient Follow-up:   Follow up with PCP in 1-2 weeks  PCP Please obtain BMP/CBC, 2 view CXR in 1week,  (see Discharge instructions)   PCP Please follow up on the following pending results:    Home Health: None   Equipment/Devices: None  Consultations: [facility] - phone Discharge Condition: Stable    CODE STATUS: Full    Diet Recommendation: Dysphagia 3 diet with feeding assistance and aspiration precautions.  CC - Nose Bleed   Brief history of present illness from the day of admission and additional interim summary     CaroleneJonesis a78 y.o.female,withhistory of recent PE diagnosed November 21, 2018 currently on Eliquis, generalized weakness and deconditioning, virtually bedbound and nursing home resident for several months, CKD 3, DM type II, hypertension, COPD, GERD, seizures, obesity, CVA who lives in a nursing home and initially presented to [facility] ER with chief complaints of nosebleeding. She was found to have epistaxis and it was treated. Subsequently testing suggested that she had COVID-19 infection and sent to [facility] for further care. Patient is currently relatively symptom-free. Denies any headache, no chest pain shortness of breath. No productive cough. No focal weakness which is new.                                                                 Hospital Course    1.  Acute Covid 19 Viral Pneumonitis during the ongoing 2020 Covid 19 Pandemic - she had mild disease at best, finished her IV steroid and remdesivir course stable on room air.  This problem has resolved.  COVID-19 Labs  Recent  Labs    12/29/18 0422 12/30/18 0320 12/31/18 0420  DDIMER 3.39* 1.99* 2.24*  CRP 9.4* 4.2* 2.1*    Lab Results  Component Value Date   SARSCOV2NAA POSITIVE (A) 12/19/2018   SARSCOV2NAA NOT DETECTED 11/24/2018   [place] NEGATIVE 11/21/2018   [place] NEGATIVE 09/06/2018   2.Recent PE diagnosed October 2020.   Continue Eliquis. Nosebleed seems to have resolved.  3.History of ESBL UTI. She was started on Invanz on 12/19/2018. Currently no signs of active infection.  4.Leukocytosis - Aspiration 11/12 + steroids.  Much improved after antibiotic use.  5.History of CVA. Continue statin for secondary prevention along with anticoagulation with eliquis.  6.Weakness, deconditioning, bedbound status. Supportive care discharge back to SNF once better.  7.GERD. Switch PPI to Pepcid, rule out C. difficile first.  8.Gastroenteritis. Likely Covid related likely C. difficile colonized, PCR negative.  Diarrhea has completely resolved.  9.CKD 2. Baseline creatinine 1.2 will monitor.   10.  History of seizures.  On Keppra.  11.  Episode of unresponsiveness  on 12/24/2018 morning.  Patient has had 2 similar episodes in the past, deemed to be due to seizures, she had thorough neurological work-up less than 1 year ago for 2 similar episodes and they were thought to be seizure related. She was already on Eliquis hence not a candidate for TPA, last known time of normal exam unknown hence again not a candidate for TPA.  She returned to normal within a few hours of Keppra.  Head CT unremarkable, EEG nonspecific.  Case was discussed with neurologist Dr. Lorraine Lax multiple times.  Kindly see note from 12/24/2018.  Patient had another breakthrough seizure-like episode morning of 12/26/2018, she threw up and had a bowel movement.  Case was discussed with neurologist Dr. Lorraine Lax again on 12/26/2018, Keppra dose has been increased from 500 mg to 1 g.  Unfortunately on  12/30/2018 morning she appears slightly postictal I think early in the morning while in sleep she might have had another breakthrough seizure, although she seems now close to her baseline however she is not quite at it yet.  Discussed with Dr. Cheral Marker neurologist on 12/30/2018.  Vimpat has been added.  We will continue to monitor closely.   She is now back to baseline.  12.  Anemia of chronic disease.  Monitoring.  Type screen done.  Some fall due to hemodilution.  Will monitor closely.   13. Aspiration PNA during second seizure episode on 12/26/2018 as she vomited.    Responded very well to Unasyn and will get 3 more days of oral Augmentin upon discharge.  Clinically stable and this problem has clinically completely stabilized and resolved.  14. Recent episodes of epistaxis prompting this admission at [facility].  Resolved.    15. DM type II. Continue home Rx.  Lab Results  Component Value Date   HGBA1C 5.1 12/21/2018   CBG (last 3)  Recent Labs    12/30/18 1615 12/30/18 2010 12/31/18 0804  GLUCAP 80 143* 79     Discharge diagnosis     Active Problems:   COVID-19 virus infection    Discharge instructions    Discharge Instructions    Discharge instructions   Complete by: As directed    Follow with Primary MD Park Liter P, DO in 7 days   Get CBC, CMP, 2 view Chest X ray -  checked next visit within 1 week by Primary MD or SNF MD   Activity: As tolerated with Full fall precautions use walker/cane & assistance as needed  Disposition SNF  Diet: Dysphagia 3 diet with feeding assistance and aspiration precautions.  Check CBGs QAC-HS  Special Instructions: If you have smoked or chewed Tobacco  in the last 2 yrs please stop smoking, stop any regular Alcohol  and or any Recreational drug use.  On your next visit with your primary care physician please Get Medicines reviewed and adjusted.  Please request your Prim.MD to go over all Hospital Tests and  Procedure/Radiological results at the follow up, please get all Hospital records sent to your Prim MD by signing hospital release before you go home.  If you experience worsening of your admission symptoms, develop shortness of breath, life threatening emergency, suicidal or homicidal thoughts you must seek medical attention immediately by calling 911 or calling your MD immediately  if symptoms less severe.  You Must read complete instructions/literature along with all the possible adverse reactions/side effects for all the Medicines you take and that have been prescribed to you. Take any new Medicines  after you have completely understood and accpet all the possible adverse reactions/side effects.   Increase activity slowly   Complete by: As directed       Discharge Medications   Allergies as of 12/31/2018      Reactions   Bee Venom Swelling   Enalapril Maleate Swelling      Medication List    STOP taking these medications   dexamethasone 10 MG/ML injection Commonly known as: DECADRON   ertapenem  IVPB Commonly known as: INVANZ   nitrofurantoin (macrocrystal-monohydrate) 100 MG capsule Commonly known as: MACROBID   oxymetazoline 0.05 % nasal spray Commonly known as: AFRIN     TAKE these medications   acetaminophen 650 MG CR tablet Commonly known as: TYLENOL Take 650 mg by mouth 3 (three) times daily.   albuterol 0.63 MG/3ML nebulizer solution Commonly known as: ACCUNEB Take 3 mLs by nebulization every 8 (eight) hours as needed for wheezing.   amoxicillin-clavulanate 500-125 MG tablet Commonly known as: AUGMENTIN Take 1 tablet (500 mg total) by mouth every 8 (eight) hours for 2 days.   apixaban 5 MG Tabs tablet Commonly known as: ELIQUIS Take 1 tablet (5 mg total) by mouth 2 (two) times daily. To initiate this on 11/29/2018 after completing the 10 mg twice daily treatment regimen What changed: Another medication with the same name was removed. Continue taking this  medication, and follow the directions you see here.   atorvastatin 80 MG tablet Commonly known as: LIPITOR Take 80 mg by mouth every evening.   cetirizine 10 MG tablet Commonly known as: ZYRTEC Take 5 mg by mouth at bedtime.   cholecalciferol 1000 units tablet Commonly known as: VITAMIN D Take 1,000 Units by mouth daily.   cyanocobalamin 1000 MCG tablet Take 1 tablet (1,000 mcg total) by mouth daily.   diclofenac sodium 1 % Gel Commonly known as: VOLTAREN Apply 2 g topically every 12 (twelve) hours as needed (for left knee pain).   docusate sodium 100 MG capsule Commonly known as: COLACE Take 100 mg by mouth 2 (two) times daily.   FLUoxetine 40 MG capsule Commonly known as: PROZAC Take 40 mg by mouth daily.   Fluticasone-Umeclidin-Vilant 100-62.5-25 MCG/INH Aepb Inhale 1 puff into the lungs daily.   gabapentin 300 MG capsule Commonly known as: NEURONTIN Take 300 mg by mouth at bedtime.   hydrALAZINE 25 MG tablet Commonly known as: APRESOLINE Take 25 mg by mouth 3 (three) times daily.   ipratropium 0.03 % nasal spray Commonly known as: ATROVENT Place 2 sprays into both nostrils 2 (two) times daily.   ipratropium-albuterol 0.5-2.5 (3) MG/3ML Soln Commonly known as: DUONEB Take 3 mLs by nebulization 3 (three) times daily.   Lacosamide 100 MG Tabs Take 1 tablet (100 mg total) by mouth 2 (two) times daily.   levETIRAcetam 500 MG 24 hr tablet Commonly known as: KEPPRA XR Take 2 tablets (1,000 mg total) by mouth daily. Start taking on: January 01, 2019 What changed: how much to take   linagliptin 5 MG Tabs tablet Commonly known as: TRADJENTA Take 5 mg by mouth daily.   meclizine 25 MG tablet Commonly known as: ANTIVERT Take 25 mg by mouth every 12 (twelve) hours as needed for dizziness.   multivitamin with minerals Tabs tablet Take 1 tablet by mouth daily.   mupirocin ointment 2 % Commonly known as: BACTROBAN Place 1 application into the nose 2 (two)  times daily.   nitroGLYCERIN 0.4 MG SL tablet Commonly known as: NITROSTAT Place 0.4  mg under the tongue every 5 (five) minutes as needed for chest pain.   pantoprazole 40 MG tablet Commonly known as: PROTONIX Take 1 tablet (40 mg total) by mouth daily.       Follow-up Information    Park Liter P, DO. Schedule an appointment as soon as possible for a visit in 1 week(s).   Specialty: Family Medicine Why: and your neurologist in 1 week for seizured Contact information: [address] [place] [address] [phone]           Major procedures and Radiology Reports - PLEASE review detailed and final reports thoroughly  -         Dg Chest 2 View  Result Date: 12/19/2018 CLINICAL DATA:  Hemoptysis, cough EXAM: CHEST - 2 VIEW COMPARISON:  11/24/2018 FINDINGS: Increased markings in the lower lungs, likely scarring, similar to prior study. Heart is borderline in size. Aortic atherosclerosis. No acute airspace opacities or effusions. No acute bony abnormality. IMPRESSION: Bibasilar linear densities, likely scarring. No definite acute process. Electronically Signed   By: Rolm Baptise M.D.   On: 12/19/2018 20:59   Ct Head Wo Contrast  Result Date: 12/24/2018 CLINICAL DATA:  Altered level of consciousness EXAM: CT HEAD WITHOUT CONTRAST TECHNIQUE: Contiguous axial images were obtained from the base of the skull through the vertex without intravenous contrast. COMPARISON:  12/19/2018 FINDINGS: Brain: No evidence of acute infarction, hemorrhage, hydrocephalus, extra-axial collection or mass lesion/mass effect. Mild periventricular white matter hypodensity. Vascular: No hyperdense vessel or unexpected calcification. Skull: Normal. Negative for fracture or focal lesion. Sinuses/Orbits: No acute finding. Other: None. IMPRESSION: No acute intracranial pathology.  Small-vessel white matter disease. Electronically Signed   By: Eddie Candle M.D.   On: 12/24/2018 09:04   Ct Head Wo Contrast   Result Date: 12/19/2018 CLINICAL DATA:  Headache, on Eliquis EXAM: CT HEAD WITHOUT CONTRAST TECHNIQUE: Contiguous axial images were obtained from the base of the skull through the vertex without intravenous contrast. COMPARISON:  09/06/2018 FINDINGS: Brain: Patchy chronic small vessel disease throughout the deep white matter. No acute intracranial abnormality. Specifically, no hemorrhage, hydrocephalus, mass lesion, acute infarction, or significant intracranial injury. Vascular: No hyperdense vessel or unexpected calcification. Skull: No acute calvarial abnormality. Sinuses/Orbits: Visualized paranasal sinuses and mastoids clear. Orbital soft tissues unremarkable. 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 No acute intracranial abnormality. Electronically Signed   By: Rolm Baptise M.D.   On: 12/19/2018 21:00   Ct Angio Chest Pe W And/or Wo Contrast  Result Date: 12/20/2018 CLINICAL DATA:  Nose bleed.  Hemoptysis. EXAM: CT ANGIOGRAPHY CHEST WITH CONTRAST TECHNIQUE: Multidetector CT imaging of the chest was performed using the standard protocol during bolus administration of intravenous contrast. Multiplanar CT image reconstructions and MIPs were obtained to evaluate the vascular anatomy. CONTRAST:  85mL OMNIPAQUE IOHEXOL 350 MG/ML SOLN COMPARISON:  11/21/2018 FINDINGS: Cardiovascular: Contrast injection is sufficient to demonstrate satisfactory opacification of the pulmonary arteries to the segmental level.Again identified are small subsegmental pulmonary emboli involving the left lower lobe, slightly improved from prior study. The main pulmonary artery is dilated measuring approximately 3.3 cm. Aortic calcifications are noted. The heart size is stable. Coronary artery calcifications are noted. Mediastinum/Nodes: --No mediastinal or hilar lymphadenopathy. --No axillary lymphadenopathy. --No supraclavicular lymphadenopathy. --Normal thyroid gland. --The  esophagus is unremarkable Lungs/Pleura: There are worsening bibasilar airspace opacities. There is no pneumothorax. There are trace bilateral pleural effusions, right greater than left. The trachea is unremarkable. Upper Abdomen:  No acute abnormality. Musculoskeletal: No chest wall abnormality. No acute or significant osseous findings. Review of the MIP images confirms the above findings. IMPRESSION: 1. Improving subsegmental pulmonary emboli involving the left lower lobe. 2. Worsening bibasilar airspace opacities which may represent atelectasis or infiltrates. 3. Trace bilateral pleural effusions. 4. Dilated main pulmonary artery which can be seen in patients with elevated pulmonary artery pressures. Aortic Atherosclerosis (ICD10-I70.0). Electronically Signed   By: Constance Holster M.D.   On: 12/20/2018 01:09   Cxr Am  Result Date: 12/27/2018 CLINICAL DATA:  Shortness of breath. EXAM: PORTABLE CHEST 1 VIEW COMPARISON:  12/26/2018 FINDINGS: Lordotic technique is demonstrated as the lungs are adequately inflated with subtle linear density right midlung likely atelectasis and unchanged. Mild opacification over the right base unchanged likely small amount of pleural fluid/atelectasis. Minimal linear atelectasis left base. Mild stable cardiomegaly. Remainder of the exam is unchanged. IMPRESSION: Stable minimal bibasilar opacification likely bibasilar atelectasis with small amount right pleural fluid. Mild stable cardiomegaly. Electronically Signed   By: Marin Olp M.D.   On: 12/27/2018 08:07   Dg Chest Port 1 View  Result Date: 12/26/2018 CLINICAL DATA:  Shortness of breath. EXAM: PORTABLE CHEST 1 VIEW COMPARISON:  CTA chest, 12/20/2018. Chest radiographs, 12/19/2018 and older exams. FINDINGS: Cardiac silhouette is borderline to mildly enlarged. Aorta shows atherosclerotic calcifications and is prominent. No mediastinal or hilar masses. There are linear/reticular lung base opacities, greater on the right,  also noted in the right mid lung, likely due to atelectasis. Small pleural effusions are suggested, which were noted on the prior chest CT. Stable prominent bronchovascular markings. No convincing pneumonia or pulmonary edema. No pneumothorax. Skeletal structures are grossly intact. IMPRESSION: 1. No convincing pneumonia or pulmonary edema. 2. Lung base opacities on the right have mildly increased when compared to the prior chest radiographs, most consistent with atelectasis. Small pleural effusions stable compared to the prior chest CTA. Electronically Signed   By: Lajean Manes M.D.   On: 12/26/2018 09:42    Micro Results     Recent Results (from the past 240 hour(s))  C difficile quick scan w PCR reflex     Status: Abnormal   Collection Time: 12/21/18  3:26 PM   Specimen: STOOL  Result Value Ref Range Status   C Diff antigen POSITIVE (A) NEGATIVE Final   C Diff toxin NEGATIVE NEGATIVE Final   C Diff interpretation Results are indeterminate. See PCR results.  Final    Comment: Performed at [facility], [place] [address], [place], [place] [address]  C. Diff by PCR, Reflexed     Status: None   Collection Time: 12/21/18  3:26 PM  Result Value Ref Range Status   Toxigenic C. Difficile by PCR NEGATIVE NEGATIVE Final    Comment: Patient is colonized with non toxigenic C. difficile. May not need treatment unless significant symptoms are present. Performed at [facility], [place] [address], [place], [place] [address]     Today   Subjective    Sharlee Polselli today has no headache,no chest abdominal pain,no new weakness tingling or numbness, feels much better wants to go home today.     Objective   Blood pressure 130/77, pulse 75, temperature 98 F (36.7 C), temperature source Oral, resp. rate (!) 21, height 5\' 3"  (1.6 m), weight 102.5 kg, SpO2 96 %.   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  Right eye worse than left.  Note when she gets a seizure her lateral gaze worsens towards the right side and she also develops a slight facial droop. [place].AT,PERRAL Supple Neck,No JVD, No cervical lymphadenopathy appriciated.  Symmetrical Chest wall movement, Good air movement bilaterally, CTAB RRR,No Gallops,Rubs or new Murmurs, No Parasternal Heave +ve B.Sounds, Abd Soft, Non tender, No organomegaly appriciated, No rebound -guarding or rigidity. No Cyanosis, Clubbing or edema, No new Rash or bruise   Data Review   CBC w Diff:  Lab Results  Component Value Date   WBC 6.4 12/31/2018   HGB 7.5 (L) 12/31/2018   HGB 12.6 03/27/2017   HCT 23.9 (L) 12/31/2018   HCT 37.9 03/27/2017   PLT 237 12/31/2018   PLT 252 03/27/2017   LYMPHOPCT 25 12/31/2018   LYMPHOPCT 11.0 01/23/2013   MONOPCT 12 12/31/2018   MONOPCT 1.4 01/23/2013   EOSPCT 0 12/31/2018   EOSPCT 0.0 01/23/2013   BASOPCT 0 12/31/2018   BASOPCT 0.2 01/23/2013    CMP:  Lab Results  Component Value Date   NA 138 12/31/2018   NA 142 03/21/2017   NA 139 02/20/2013   K 4.6 12/31/2018   K 4.1 02/20/2013   CL 105 12/31/2018   CL 111 (H) 02/20/2013   CO2 25 12/31/2018   CO2 23 02/20/2013   BUN 26 (H) 12/31/2018   BUN 19 03/21/2017   BUN 17 02/20/2013   CREATININE 1.30 (H) 12/31/2018   CREATININE 1.58 (H) 02/20/2013   PROT 5.8 (L) 12/31/2018   PROT 6.7 03/21/2017   PROT 7.5 01/22/2013   ALBUMIN 2.1 (L) 12/31/2018   ALBUMIN 4.1 03/21/2017   ALBUMIN 3.5 01/22/2013   BILITOT 0.4 12/31/2018   BILITOT 0.2 03/21/2017   BILITOT 0.4 01/22/2013   ALKPHOS 99 12/31/2018   ALKPHOS 92 01/22/2013   AST 39 12/31/2018   AST 22 01/22/2013   ALT 38 12/31/2018   ALT 15 01/22/2013  .   Total Time in preparing paper work, data evaluation and todays  exam - 32 minutes  Lala Lund M.D on 12/31/2018 at 11:15 AM  [facility]   Office  [phone]

## 2018-12-31 NOTE — Discharge Instructions (Signed)
Follow with Primary MD Park Liter P, DO in 7 days   Get CBC, CMP, 2 view Chest X ray -  checked next visit within 1 week by Primary MD or SNF MD   Activity: As tolerated with Full fall precautions use walker/cane & assistance as needed  Disposition SNF  Diet: Dysphagia 3 diet with feeding assistance and aspiration precautions.  CBGs [facility]  Special Instructions: If you have smoked or chewed Tobacco  in the last 2 yrs please stop smoking, stop any regular Alcohol  and or any Recreational drug use.  On your next visit with your primary care physician please Get Medicines reviewed and adjusted.  Please request your Prim.MD to go over all Hospital Tests and Procedure/Radiological results at the follow up, please get all Hospital records sent to your Prim MD by signing hospital release before you go home.  If you experience worsening of your admission symptoms, develop shortness of breath, life threatening emergency, suicidal or homicidal thoughts you must seek medical attention immediately by calling 911 or calling your MD immediately  if symptoms less severe.  You Must read complete instructions/literature along with all the possible adverse reactions/side effects for all the Medicines you take and that have been prescribed to you. Take any new Medicines after you have completely understood and accpet all the possible adverse reactions/side effects.       Person Under Monitoring Name: Katherine [name]  Location: [place] [address]   Infection Prevention Recommendations for Individuals Confirmed to have, or Being Evaluated for, 2019 Novel Coronavirus (COVID-19) Infection Who Receive Care at Home  Individuals who are confirmed to have, or are being evaluated for, COVID-19 should follow the prevention steps below until a healthcare provider or local or state health department says they can return to normal activities.  Stay home except to get medical care You  should restrict activities outside your home, except for getting medical care. Do not go to work, school, or public areas, and do not use public transportation or taxis.  Call ahead before visiting your doctor Before your medical appointment, call the healthcare provider and tell them that you have, or are being evaluated for, COVID-19 infection. This will help the healthcare providers office take steps to keep other people from getting infected. Ask your healthcare provider to call the local or state health department.  Monitor your symptoms Seek prompt medical attention if your illness is worsening (e.g., difficulty breathing). Before going to your medical appointment, call the healthcare provider and tell them that you have, or are being evaluated for, COVID-19 infection. Ask your healthcare provider to call the local or state health department.  Wear a facemask You should wear a facemask that covers your nose and mouth when you are in the same room with other people and when you visit a healthcare provider. People who live with or visit you should also wear a facemask while they are in the same room with you.  Separate yourself from other people in your home As much as possible, you should stay in a different room from other people in your home. Also, you should use a separate bathroom, if available.  Avoid sharing household items You should not share dishes, drinking glasses, cups, eating utensils, towels, bedding, or other items with other people in your home. After using these items, you should wash them thoroughly with soap and water.  Cover your coughs and sneezes Cover your mouth and nose with a tissue when  you cough or sneeze, or you can cough or sneeze into your sleeve. Throw used tissues in a lined trash can, and immediately wash your hands with soap and water for at least 20 seconds or use an alcohol-based hand rub.  Wash your [facility] your hands often and thoroughly  with soap and water for at least 20 seconds. You can use an alcohol-based hand sanitizer if soap and water are not available and if your hands are not visibly dirty. Avoid touching your eyes, nose, and mouth with unwashed hands.   Prevention Steps for Caregivers and Household Members of Individuals Confirmed to have, or Being Evaluated for, COVID-19 Infection Being Cared for in the Home  If you live with, or provide care at home for, a person confirmed to have, or being evaluated for, COVID-19 infection please follow these guidelines to prevent infection:  Follow healthcare providers instructions Make sure that you understand and can help the patient follow any healthcare provider instructions for all care.  Provide for the patients basic needs You should help the patient with basic needs in the home and provide support for getting groceries, prescriptions, and other personal needs.  Monitor the patients symptoms If they are getting sicker, call his or her medical provider and tell them that the patient has, or is being evaluated for, COVID-19 infection. This will help the healthcare providers office take steps to keep other people from getting infected. Ask the healthcare provider to call the local or state health department.  Limit the number of people who have contact with the patient  If possible, have only one caregiver for the patient.  Other household members should stay in another home or place of residence. If this is not possible, they should stay  in another room, or be separated from the patient as much as possible. Use a separate bathroom, if available.  Restrict visitors who do not have an essential need to be in the home.  Keep older adults, very young children, and other sick people away from the patient Keep older adults, very young children, and those who have compromised immune systems or chronic health conditions away from the patient. This includes people  with chronic heart, lung, or kidney conditions, diabetes, and cancer.  Ensure good ventilation Make sure that shared spaces in the home have good air flow, such as from an air conditioner or an opened window, weather permitting.  Wash your hands often  Wash your hands often and thoroughly with soap and water for at least 20 seconds. You can use an alcohol based hand sanitizer if soap and water are not available and if your hands are not visibly dirty.  Avoid touching your eyes, nose, and mouth with unwashed hands.  Use disposable paper towels to dry your hands. If not available, use dedicated cloth towels and replace them when they become wet.  Wear a facemask and gloves  Wear a disposable facemask at all times in the room and gloves when you touch or have contact with the patients blood, body fluids, and/or secretions or excretions, such as sweat, saliva, sputum, nasal mucus, vomit, urine, or feces.  Ensure the mask fits over your nose and mouth tightly, and do not touch it during use.  Throw out disposable facemasks and gloves after using them. Do not reuse.  Wash your hands immediately after removing your facemask and gloves.  If your personal clothing becomes contaminated, carefully remove clothing and launder. Wash your hands after handling contaminated clothing.  Place all used disposable facemasks, gloves, and other waste in a lined container before disposing them with other household waste.  Remove gloves and wash your hands immediately after handling these items.  Do not share dishes, glasses, or other household items with the patient  Avoid sharing household items. You should not share dishes, drinking glasses, cups, eating utensils, towels, bedding, or other items with a patient who is confirmed to have, or being evaluated for, COVID-19 infection.  After the person uses these items, you should wash them thoroughly with soap and water.  Wash laundry  thoroughly  Immediately remove and wash clothes or bedding that have blood, body fluids, and/or secretions or excretions, such as sweat, saliva, sputum, nasal mucus, vomit, urine, or feces, on them.  Wear gloves when handling laundry from the patient.  Read and follow directions on labels of laundry or clothing items and detergent. In general, wash and dry with the warmest temperatures recommended on the label.  Clean all areas the individual has used often  Clean all touchable surfaces, such as counters, tabletops, doorknobs, bathroom fixtures, toilets, phones, keyboards, tablets, and bedside tables, every day. Also, clean any surfaces that may have blood, body fluids, and/or secretions or excretions on them.  Wear gloves when cleaning surfaces the patient has come in contact with.  Use a diluted bleach solution (e.g., dilute bleach with 1 part bleach and 10 parts water) or a household disinfectant with a label that says EPA-registered for coronaviruses. To make a bleach solution at home, add 1 tablespoon of bleach to 1 quart (4 cups) of water. For a larger supply, add  cup of bleach to 1 gallon (16 cups) of water.  Read labels of cleaning products and follow recommendations provided on product labels. Labels contain instructions for safe and effective use of the cleaning product including precautions you should take when applying the product, such as wearing gloves or eye protection and making sure you have good ventilation during use of the product.  Remove gloves and wash hands immediately after cleaning.  Monitor yourself for signs and symptoms of illness Caregivers and household members are considered close contacts, should monitor their health, and will be asked to limit movement outside of the home to the extent possible. Follow the monitoring steps for close contacts listed on the symptom monitoring form.   ? If you have additional questions, contact your local health department  or call the epidemiologist on call at [phone] (available 24/7). ? This guidance is subject to change. For the most up-to-date guidance from [facility], please refer to their website: [email or web address]

## 2019-01-01 DIAGNOSIS — J9611 Chronic respiratory failure with hypoxia: Secondary | ICD-10-CM | POA: Diagnosis not present

## 2019-01-01 DIAGNOSIS — N390 Urinary tract infection, site not specified: Secondary | ICD-10-CM | POA: Diagnosis not present

## 2019-01-01 DIAGNOSIS — I509 Heart failure, unspecified: Secondary | ICD-10-CM | POA: Diagnosis not present

## 2019-01-02 ENCOUNTER — Emergency Department: Payer: Medicare HMO

## 2019-01-02 ENCOUNTER — Other Ambulatory Visit: Payer: Self-pay

## 2019-01-02 ENCOUNTER — Encounter: Payer: Self-pay | Admitting: *Deleted

## 2019-01-02 ENCOUNTER — Emergency Department
Admission: EM | Admit: 2019-01-02 | Discharge: 2019-01-03 | Payer: Medicare HMO | Attending: Emergency Medicine | Admitting: Emergency Medicine

## 2019-01-02 DIAGNOSIS — Z7984 Long term (current) use of oral hypoglycemic drugs: Secondary | ICD-10-CM | POA: Diagnosis not present

## 2019-01-02 DIAGNOSIS — R404 Transient alteration of awareness: Secondary | ICD-10-CM | POA: Diagnosis not present

## 2019-01-02 DIAGNOSIS — Z87891 Personal history of nicotine dependence: Secondary | ICD-10-CM | POA: Diagnosis not present

## 2019-01-02 DIAGNOSIS — R52 Pain, unspecified: Secondary | ICD-10-CM | POA: Diagnosis not present

## 2019-01-02 DIAGNOSIS — R509 Fever, unspecified: Secondary | ICD-10-CM | POA: Diagnosis not present

## 2019-01-02 DIAGNOSIS — I5032 Chronic diastolic (congestive) heart failure: Secondary | ICD-10-CM | POA: Insufficient documentation

## 2019-01-02 DIAGNOSIS — I130 Hypertensive heart and chronic kidney disease with heart failure and stage 1 through stage 4 chronic kidney disease, or unspecified chronic kidney disease: Secondary | ICD-10-CM | POA: Insufficient documentation

## 2019-01-02 DIAGNOSIS — N183 Chronic kidney disease, stage 3 unspecified: Secondary | ICD-10-CM | POA: Insufficient documentation

## 2019-01-02 DIAGNOSIS — E119 Type 2 diabetes mellitus without complications: Secondary | ICD-10-CM | POA: Insufficient documentation

## 2019-01-02 DIAGNOSIS — Z79899 Other long term (current) drug therapy: Secondary | ICD-10-CM | POA: Diagnosis not present

## 2019-01-02 DIAGNOSIS — D649 Anemia, unspecified: Secondary | ICD-10-CM

## 2019-01-02 DIAGNOSIS — R0902 Hypoxemia: Secondary | ICD-10-CM | POA: Diagnosis not present

## 2019-01-02 DIAGNOSIS — J189 Pneumonia, unspecified organism: Secondary | ICD-10-CM | POA: Diagnosis not present

## 2019-01-02 DIAGNOSIS — U071 COVID-19: Secondary | ICD-10-CM | POA: Insufficient documentation

## 2019-01-02 DIAGNOSIS — Z7901 Long term (current) use of anticoagulants: Secondary | ICD-10-CM | POA: Diagnosis not present

## 2019-01-02 DIAGNOSIS — J80 Acute respiratory distress syndrome: Secondary | ICD-10-CM | POA: Diagnosis not present

## 2019-01-02 DIAGNOSIS — R55 Syncope and collapse: Secondary | ICD-10-CM | POA: Diagnosis present

## 2019-01-02 DIAGNOSIS — J449 Chronic obstructive pulmonary disease, unspecified: Secondary | ICD-10-CM | POA: Diagnosis not present

## 2019-01-02 DIAGNOSIS — R000 Tachycardia, unspecified: Secondary | ICD-10-CM | POA: Diagnosis not present

## 2019-01-02 DIAGNOSIS — R0602 Shortness of breath: Secondary | ICD-10-CM | POA: Diagnosis not present

## 2019-01-02 LAB — PROTIME-INR
INR: 1.6 — ABNORMAL HIGH (ref 0.8–1.2)
Prothrombin Time: 18.8 seconds — ABNORMAL HIGH (ref 11.4–15.2)

## 2019-01-02 LAB — COMPREHENSIVE METABOLIC PANEL
ALT: 26 U/L (ref 0–44)
AST: 21 U/L (ref 15–41)
Albumin: 2.3 g/dL — ABNORMAL LOW (ref 3.5–5.0)
Alkaline Phosphatase: 98 U/L (ref 38–126)
Anion gap: 10 (ref 5–15)
BUN: 26 mg/dL — ABNORMAL HIGH (ref 8–23)
CO2: 24 mmol/L (ref 22–32)
Calcium: 8.6 mg/dL — ABNORMAL LOW (ref 8.9–10.3)
Chloride: 103 mmol/L (ref 98–111)
Creatinine, Ser: 1.7 mg/dL — ABNORMAL HIGH (ref 0.44–1.00)
GFR calc Af Amer: 33 mL/min — ABNORMAL LOW (ref >60)
GFR calc non Af Amer: 29 mL/min — ABNORMAL LOW (ref >60)
Glucose, Bld: 90 mg/dL (ref 70–99)
Potassium: 4.3 mmol/L (ref 3.5–5.1)
Sodium: 137 mmol/L (ref 135–145)
Total Bilirubin: 0.8 mg/dL (ref 0.3–1.2)
Total Protein: 6.5 g/dL (ref 6.5–8.1)

## 2019-01-02 LAB — CBC WITH DIFFERENTIAL/PLATELET
Abs Immature Granulocytes: 0.06 10*3/uL (ref 0.00–0.07)
Basophils Absolute: 0 10*3/uL (ref 0.0–0.1)
Basophils Relative: 0 %
Eosinophils Absolute: 0 10*3/uL (ref 0.0–0.5)
Eosinophils Relative: 0 %
HCT: 25.1 % — ABNORMAL LOW (ref 36.0–46.0)
Hemoglobin: 7.9 g/dL — ABNORMAL LOW (ref 12.0–15.0)
Immature Granulocytes: 1 %
Lymphocytes Relative: 12 %
Lymphs Abs: 1.1 10*3/uL (ref 0.7–4.0)
MCH: 26.2 pg (ref 26.0–34.0)
MCHC: 31.5 g/dL (ref 30.0–36.0)
MCV: 83.1 fL (ref 80.0–100.0)
Monocytes Absolute: 1.1 10*3/uL — ABNORMAL HIGH (ref 0.1–1.0)
Monocytes Relative: 11 %
Neutro Abs: 7.2 10*3/uL (ref 1.7–7.7)
Neutrophils Relative %: 76 %
Platelets: 245 10*3/uL (ref 150–400)
RBC: 3.02 MIL/uL — ABNORMAL LOW (ref 3.87–5.11)
RDW: 18.6 % — ABNORMAL HIGH (ref 11.5–15.5)
WBC: 9.5 10*3/uL (ref 4.0–10.5)
nRBC: 0 % (ref 0.0–0.2)

## 2019-01-02 LAB — LACTATE DEHYDROGENASE: LDH: 136 U/L (ref 98–192)

## 2019-01-02 LAB — LACTIC ACID, PLASMA: Lactic Acid, Venous: 1.1 mmol/L (ref 0.5–1.9)

## 2019-01-02 LAB — FIBRIN DERIVATIVES D-DIMER (ARMC ONLY): Fibrin derivatives D-dimer (ARMC): 1659.38 ng/mL (FEU) — ABNORMAL HIGH (ref 0.00–499.00)

## 2019-01-02 LAB — TROPONIN I (HIGH SENSITIVITY): Troponin I (High Sensitivity): 4 ng/L (ref <18)

## 2019-01-02 LAB — POC SARS CORONAVIRUS 2 AG -  ED: SARS Coronavirus 2 Ag: NEGATIVE

## 2019-01-02 LAB — BRAIN NATRIURETIC PEPTIDE: B Natriuretic Peptide: 108 pg/mL — ABNORMAL HIGH (ref 0.0–100.0)

## 2019-01-02 MED ORDER — VANCOMYCIN HCL IN DEXTROSE 1-5 GM/200ML-% IV SOLN
1000.0000 mg | Freq: Once | INTRAVENOUS | Status: AC
  Administered 2019-01-02: 1000 mg via INTRAVENOUS
  Filled 2019-01-02: qty 200

## 2019-01-02 MED ORDER — ONDANSETRON HCL 4 MG/2ML IJ SOLN
4.0000 mg | Freq: Once | INTRAMUSCULAR | Status: AC
  Administered 2019-01-02: 23:00:00 4 mg via INTRAVENOUS
  Filled 2019-01-02: qty 2

## 2019-01-02 MED ORDER — ACETAMINOPHEN 500 MG PO TABS
1000.0000 mg | ORAL_TABLET | Freq: Once | ORAL | Status: AC
  Administered 2019-01-02: 1000 mg via ORAL
  Filled 2019-01-02: qty 2

## 2019-01-02 MED ORDER — SODIUM CHLORIDE 0.9 % IV BOLUS
1000.0000 mL | Freq: Once | INTRAVENOUS | Status: AC
  Administered 2019-01-02: 23:00:00 1000 mL via INTRAVENOUS

## 2019-01-02 MED ORDER — VANCOMYCIN HCL 1.5 G IV SOLR
1500.0000 mg | Freq: Once | INTRAVENOUS | Status: AC
  Administered 2019-01-03: 02:00:00 1500 mg via INTRAVENOUS
  Filled 2019-01-02: qty 1500

## 2019-01-02 MED ORDER — SODIUM CHLORIDE 0.9 % IV SOLN
2.0000 g | Freq: Once | INTRAVENOUS | Status: AC
  Administered 2019-01-02: 23:00:00 2 g via INTRAVENOUS
  Filled 2019-01-02: qty 2

## 2019-01-02 NOTE — ED Provider Notes (Signed)
[facility] Emergency Department Provider Note ____________________________________________   None    (approximate)  I have reviewed the triage vital signs and the nursing notes.   HISTORY  Chief Complaint Loss of Consciousness  Level 5 caveat: History present illness limited due to respiratory distress  HPI Katherine [name] is a 78 y.o. female with PMH as noted below including recent hospitalization for COVID-19 pneumonitis who presents after an episode of apparent unresponsiveness.  The patient was found by staff at her facility to be unresponsive and with gurgling or grunting respirations.  When EMS arrived, they suctioned the patient and she became more responsive.  She had an O2 saturation in the low to mid 90s on 2 L by nasal cannula.  The patient herself states she feels short of breath, dizzy, and nauseated.  She denies any acute pain.  Past Medical History:  Diagnosis Date   Angioedema    Anxiety and depression    CHF (congestive heart failure) (HCC)    Chronic constipation    Chronic kidney disease    stage 3   COPD (chronic obstructive pulmonary disease) (HCC)    Diabetes mellitus without complication ([place])    Gallstones    GERD (gastroesophageal reflux disease)    Hypertension    Left ventricular hypertrophy    Osteoarthritis    Prurigo nodularis    Seizures ([place])    Stroke ([place])    Tobacco abuse    Vitamin D deficiency disease     Patient Active Problem List   Diagnosis Date Noted   COVID-19 12/21/2018   COVID-19 virus infection 12/21/2018   Hemoptysis 12/20/2018   Pulmonary embolism ([place]) 11/21/2018   Delirium    UTI (urinary tract infection) 08/21/2018   Chest pain 07/15/2018   Palliative care encounter 05/10/2018   Localized edema 05/10/2018   Shortness of breath 05/10/2018   Hematemesis 04/28/2018   Influenza A 04/13/2018   Acute on chronic congestive heart failure ([place])    COPD with  acute exacerbation ([place]) 02/24/2018   Chronic diastolic heart failure ([place]) 12/31/2017   Lymphedema 12/31/2017   COPD exacerbation ([place]) 11/30/2017   Urinary tract infection 11/22/2017   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[place]) 11/16/2017   Diabetes mellitus type 2, uncomplicated ([place]) 0000000   Pressure injury of skin 10/29/2017   Acute kidney injury superimposed on CKD ([place]) 10/24/2017   Severe sepsis ([place]) 10/24/2017   Possible Seizures ([place]) 10/08/2017   Hyperlipemia 10/08/2017   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   Stroke-like episode ([place]) s/p IV tpa 10/04/2017   Palliative care by specialist    Elevated rheumatoid factor 09/05/2017   Frequent hospital admissions 08/22/2017   Aphasia 06/28/2017   Hand pain 03/21/2017   Dry skin 03/21/2017   Pain in finger of left hand 09/12/2016   Chronic fatigue 06/12/2016   Left knee pain 06/12/2016   Goals of care, counseling/discussion 03/13/2016   Primary localized osteoarthritis of right knee 02/08/2016   OSA (obstructive sleep apnea) 09/16/2015   Rotator cuff syndrome 09/07/2015   Pulmonary scarring 07/27/2015   Sleep disturbance 04/14/2015   Coronary artery disease 03/14/2015   Polyp of vocal cord 03/14/2015   Acute respiratory failure with hypoxia ([place]) [id number]   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   Anxiety    Tobacco abuse    Prurigo nodularis    GERD (gastroesophageal reflux disease)    COPD (chronic obstructive pulmonary disease) ([place])    Osteoarthritis    Vitamin D deficiency  disease    Chronic constipation    CKD (chronic kidney disease), stage III    Essential hypertension 05/20/2013   Morbid obesity ([place]) 05/20/2013    Past Surgical History:  Procedure Laterality Date   CHOLECYSTECTOMY     POLYPECTOMY  11/2011   vocal cord   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   Procedure: TOTAL KNEE ARTHROPLASTY;  Surgeon: Hessie Knows, MD;   Location: [facility] ORS;  Service: Orthopedics;  Laterality: Right;    Prior to Admission medications   Medication Sig Start Date End Date Taking? Authorizing Provider  acetaminophen (TYLENOL) 650 MG CR tablet Take 650 mg by mouth 3 (three) times daily.    Yes [provider]  albuterol (ACCUNEB) 0.63 MG/3ML nebulizer solution Take 3 mLs by nebulization every 8 (eight) hours as needed for wheezing.    Yes [provider]  amoxicillin-clavulanate (AUGMENTIN) 500-125 MG tablet Take 1 tablet (500 mg total) by mouth every 8 (eight) hours for 2 days. 12/31/18 01/02/19 Yes Thurnell Lose, MD  apixaban (ELIQUIS) 5 MG TABS tablet Take 1 tablet (5 mg total) by mouth 2 (two) times daily. To initiate this on 11/29/2018 after completing the 10 mg twice daily treatment regimen 11/29/18  Yes Ojie, Jude, MD  atorvastatin (LIPITOR) 80 MG tablet Take 80 mg by mouth every evening.    Yes [provider]  cetirizine (ZYRTEC) 10 MG tablet Take 5 mg by mouth at bedtime.   Yes [provider]  cholecalciferol (VITAMIN D) 1000 units tablet Take 1,000 Units by mouth daily.   Yes [provider]  diclofenac sodium (VOLTAREN) 1 % GEL Apply 2 g topically every 12 (twelve) hours as needed (for left knee pain).    Yes [provider]  docusate sodium (COLACE) 100 MG capsule Take 100 mg by mouth 2 (two) times daily.   Yes [provider]  FLUoxetine (PROZAC) 40 MG capsule Take 40 mg by mouth daily.    Yes [provider]  Fluticasone-Umeclidin-Vilant 100-62.5-25 MCG/INH AEPB Inhale 1 puff into the lungs daily.   Yes [provider]  gabapentin (NEURONTIN) 300 MG capsule Take 300 mg by mouth at bedtime.   Yes [provider]  hydrALAZINE (APRESOLINE) 25 MG tablet Take 25 mg by mouth 3 (three) times daily. 06/19/18  Yes [provider]  ipratropium (ATROVENT) 0.03 % nasal spray Place 2 sprays into both nostrils 2 (two) times daily.     Yes [provider]  ipratropium-albuterol (DUONEB) 0.5-2.5 (3) MG/3ML SOLN Take 3 mLs by nebulization 3 (three) times daily. 07/17/18  Yes Gladstone Lighter, MD  lacosamide 100 MG TABS Take 1 tablet (100 mg total) by mouth 2 (two) times daily. 12/31/18  Yes Thurnell Lose, MD  levETIRAcetam (KEPPRA XR) 500 MG 24 hr tablet Take 2 tablets (1,000 mg total) by mouth daily. 01/01/19  Yes Thurnell Lose, MD  linagliptin (TRADJENTA) 5 MG TABS tablet Take 5 mg by mouth daily.   Yes [provider]  meclizine (ANTIVERT) 25 MG tablet Take 25 mg by mouth every 12 (twelve) hours as needed for dizziness.    Yes [provider]  Multiple Vitamin (MULTIVITAMIN WITH MINERALS) TABS tablet Take 1 tablet by mouth daily.   Yes [provider]  mupirocin ointment (BACTROBAN) 2 % Place 1 application into the nose 2 (two) times daily. 06/13/18  Yes Wieting, Richard, MD  pantoprazole (PROTONIX) 40 MG tablet Take 1 tablet (40 mg total) by mouth daily. 04/29/18  Yes Bettey Costa, MD  vitamin B-12 1000 MCG tablet Take 1 tablet (1,000 mcg total) by mouth daily. 04/30/18  Yes Mody, Ulice Bold, MD  nitroGLYCERIN (NITROSTAT) 0.4 MG SL tablet Place 0.4 mg under the tongue every 5 (five) minutes as needed for chest pain.  08/07/18   [provider]    Allergies Bee venom and Enalapril maleate  Family History  Problem Relation Age of Onset   Alcohol abuse Mother    Sickle cell anemia Daughter    Hypertension Son    Cancer Neg Hx    COPD Neg Hx    Diabetes Neg Hx    Heart disease Neg Hx    Stroke Neg Hx     Social History Social History   Tobacco Use   Smoking status: Former Smoker    Packs/day: 0.50    Years: 60.00    Pack years: 30.00    Types: Cigarettes   Smokeless tobacco: Never Used   Tobacco comment: quite 100mo ago-03/26/18  Substance Use Topics   Alcohol use: No    Alcohol/week: 0.0 standard drinks    Comment: rare   Drug use: No    Review of  Systems Level 5 caveat: Unable to obtain review of systems due to respiratory distress    ____________________________________________   PHYSICAL EXAM:  VITAL SIGNS: ED Triage Vitals  Enc Vitals Group     BP 01/02/19 2123 90/62     Pulse Rate 01/02/19 2123 (!) 104     Resp 01/02/19 2123 (!) 21     Temp 01/02/19 2123 98.3 F (36.8 C)     Temp Source 01/02/19 2123 Oral     SpO2 01/02/19 2119 99 %     Weight --      Height --      Head Circumference --      Peak Flow --      Pain Score --      Pain Loc --      Pain Edu? --      Excl. in [place]? --     Constitutional: Alert, uncomfortable appearing. Eyes: Conjunctivae are normal.  EOMI. Head: Atraumatic. Nose: No congestion/rhinnorhea. Mouth/Throat: Mucous membranes are somewhat dry.   Neck: Normal range of motion.  Cardiovascular: Tachycardic, regular rhythm. Grossly normal heart sounds.  Good peripheral circulation. Respiratory: Increased respiratory effort.  No retractions. Lungs CTAB. Gastrointestinal: No distention.  Musculoskeletal: 1+ bilateral lower extremity edema.  Extremities warm and well perfused.  Neurologic: Motor intact in all extremities.  Skin:  Skin is warm and dry. No rash noted. Psychiatric: Calm and cooperative.  ____________________________________________   LABS (all labs ordered are listed, but only abnormal results are displayed)  Labs Reviewed  COMPREHENSIVE METABOLIC PANEL - Abnormal; Notable for the following components:      Result Value   BUN 26 (*)    Creatinine, Ser 1.70 (*)    Calcium 8.6 (*)    Albumin 2.3 (*)    GFR calc non Af Amer 29 (*)    GFR calc Af Amer 33 (*)    All other components within normal limits  BRAIN NATRIURETIC PEPTIDE - Abnormal; Notable for the following components:   B Natriuretic Peptide 108.0 (*)    All other components within normal limits  CBC WITH DIFFERENTIAL/PLATELET - Abnormal; Notable for the following components:   RBC 3.02 (*)    Hemoglobin 7.9  (*)    HCT 25.1 (*)    RDW 18.6 (*)  Monocytes Absolute 1.1 (*)    All other components within normal limits  PROTIME-INR - Abnormal; Notable for the following components:   Prothrombin Time 18.8 (*)    INR 1.6 (*)    All other components within normal limits  FIBRIN DERIVATIVES D-DIMER ([facility] ONLY) - Abnormal; Notable for the following components:   Fibrin derivatives D-dimer [facility]) 1,659.38 (*)    All other components within normal limits  CULTURE, BLOOD (ROUTINE X 2)  CULTURE, BLOOD (ROUTINE X 2)  URINE CULTURE  LACTIC ACID, PLASMA  LACTATE DEHYDROGENASE  LACTIC ACID, PLASMA  URINALYSIS, COMPLETE (UACMP) WITH MICROSCOPIC  PROCALCITONIN  C-REACTIVE PROTEIN  POC SARS CORONAVIRUS 2 AG -  ED  TROPONIN I (HIGH SENSITIVITY)  TROPONIN I (HIGH SENSITIVITY)   ____________________________________________  EKG  ED ECG REPORT I, Arta Silence, the attending physician, personally viewed and interpreted this ECG.  Date: 01/02/2019 EKG Time: 2122 Rate: 103 Rhythm: Sinus tachycardia  QRS Axis: normal Intervals: normal ST/T Wave abnormalities: nonspecific T wave abnormalities Narrative Interpretation: Nonspecific abnormalities with no evidence of acute ischemia  ____________________________________________  RADIOLOGY  CXR: Small amount of pleural fluid on the right.  No acute change when compared to recent prior x-rays.  ____________________________________________   PROCEDURES  Procedure(s) performed: No  Procedures  Critical Care performed: Yes  CRITICAL CARE Performed by: Arta Silence   Total critical care time: 45 minutes  Critical care time was exclusive of separately billable procedures and treating other patients.  Critical care was necessary to treat or prevent imminent or life-threatening deterioration.  Critical care was time spent personally by me on the following activities: development of treatment plan with patient and/or surrogate as  well as nursing, discussions with consultants, evaluation of patient's response to treatment, examination of patient, obtaining history from patient or surrogate, ordering and performing treatments and interventions, ordering and review of laboratory studies, ordering and review of radiographic studies, pulse oximetry and re-evaluation of patient's condition. ____________________________________________   INITIAL IMPRESSION / ASSESSMENT AND PLAN / ED COURSE  Pertinent labs & imaging results that were available during my care of the patient were reviewed by me and considered in my medical decision making (see chart for details).  78 year old female with PMH as noted below presents after an episode of unresponsiveness and with apparent respiratory distress.  EMS found the patient unresponsive and with gurgling type respirations.  They suctioned her and she awoke and became conversant although she was continue to grunt with her breathing.  O2 saturation was in the mid 90s on 2 L by nasal cannula.  Reviewed the past medical records in [place].  The patient was just discharged from the hospital 2 days ago after being treated for COVID-19, with her symptoms being characterized as relatively mild at that time.  She was also treated for a PE in October, and is on Eliquis.  On exam, the patient is alert.  She is tachypneic and with some increased work of breathing, and is intermittently grunting with her breaths, however this appears to be primarily voluntary and due to her discomfort, as her airway is intact and she is oxygenating well.  She is oriented, able to speak in short sentences, and answering questions.  Neurologic exam is nonfocal.  Blood pressure is somewhat low, and the patient is febrile to 101.  The remainder of the exam is as described above.  Overall I am most concerned for persistent symptoms due to COVID-19, versus possible HCAP or other pneumonia, or less likely CHF  or other cardiac etiology.   I have a lower suspicion for PE given that the patient does appear to have a productive cough and is febrile.  In terms of her respiratory status, the patient is stable on O2 by nasal cannula.  We will obtain a chest x-ray, sepsis work-up, give fluids, and reassess.  I anticipate admission.  ----------------------------------------- 11:35 PM on 01/02/2019 -----------------------------------------  Chest x-ray shows no acute findings.  The patient's Covid antigen test is positive (incorrectly documented as negative by the RN).  Based on these findings I suspect persistent UTI is the most likely cause of the patient's symptoms.  Her vital signs are stable.  She is receiving IV fluids.  I have signed the patient out to the oncoming physician Dr. Beather Arbour with a plan for admission.   ________________________________  [name] was evaluated in Emergency Department on 01/02/2019 for the symptoms described in the history of present illness. She was evaluated in the context of the global COVID-19 pandemic, which necessitated consideration that the patient might be at risk for infection with the SARS-CoV-2 virus that causes COVID-19. Institutional protocols and algorithms that pertain to the evaluation of patients at risk for COVID-19 are in a state of rapid change based on information released by regulatory bodies including the CDC and federal and state organizations. These policies and algorithms were followed during the patient's care in the ED.  ____________________________________________   FINAL CLINICAL IMPRESSION(S) / ED DIAGNOSES  Final diagnoses:  None      NEW MEDICATIONS STARTED DURING THIS VISIT:  New Prescriptions   No medications on file     Note:  This document was prepared using Dragon voice recognition software and may include unintentional dictation errors.    Arta Silence, MD 01/02/19 2348

## 2019-01-02 NOTE — ED Notes (Signed)
IV team at bedside 

## 2019-01-02 NOTE — ED Triage Notes (Signed)
Pt presents via EMS from [facility]. Pt found by staff unresponsive in bed @ 2030 tonight.EMS suctioned airway and pt began becoming more responsive.

## 2019-01-02 NOTE — Progress Notes (Signed)
PHARMACY -  BRIEF ANTIBIOTIC NOTE   Pharmacy has received consult(s) for Vancomycin, Cefepime  from an ED provider.  The patient's profile has been reviewed for ht/wt/allergies/indication/available labs.    One time order(s) placed for Cefepime 2 gm IV X 1 and Vancomycin 2500 mg IV X 1.   Further antibiotics/pharmacy consults should be ordered by admitting physician if indicated.                       Thank you, Emmerson Shuffield D 01/02/2019  10:17 PM

## 2019-01-03 ENCOUNTER — Inpatient Hospital Stay (HOSPITAL_COMMUNITY)
Admission: AD | Admit: 2019-01-03 | Discharge: 2019-01-11 | DRG: 871 | Disposition: A | Discharge status: Skilled Nursing Facility | Payer: Medicare HMO | Source: Other Acute Inpatient Hospital | Attending: Internal Medicine | Admitting: Internal Medicine

## 2019-01-03 DIAGNOSIS — Z87891 Personal history of nicotine dependence: Secondary | ICD-10-CM

## 2019-01-03 DIAGNOSIS — Z96651 Presence of right artificial knee joint: Secondary | ICD-10-CM | POA: Diagnosis present

## 2019-01-03 DIAGNOSIS — D649 Anemia, unspecified: Secondary | ICD-10-CM | POA: Diagnosis not present

## 2019-01-03 DIAGNOSIS — I10 Essential (primary) hypertension: Secondary | ICD-10-CM | POA: Diagnosis present

## 2019-01-03 DIAGNOSIS — Z8673 Personal history of transient ischemic attack (TIA), and cerebral infarction without residual deficits: Secondary | ICD-10-CM

## 2019-01-03 DIAGNOSIS — N179 Acute kidney failure, unspecified: Secondary | ICD-10-CM | POA: Diagnosis present

## 2019-01-03 DIAGNOSIS — R509 Fever, unspecified: Secondary | ICD-10-CM | POA: Diagnosis present

## 2019-01-03 DIAGNOSIS — Z8619 Personal history of other infectious and parasitic diseases: Secondary | ICD-10-CM

## 2019-01-03 DIAGNOSIS — A419 Sepsis, unspecified organism: Secondary | ICD-10-CM | POA: Diagnosis not present

## 2019-01-03 DIAGNOSIS — F29 Unspecified psychosis not due to a substance or known physiological condition: Secondary | ICD-10-CM | POA: Diagnosis not present

## 2019-01-03 DIAGNOSIS — K219 Gastro-esophageal reflux disease without esophagitis: Secondary | ICD-10-CM | POA: Diagnosis present

## 2019-01-03 DIAGNOSIS — Z7401 Bed confinement status: Secondary | ICD-10-CM | POA: Diagnosis not present

## 2019-01-03 DIAGNOSIS — N1832 Chronic kidney disease, stage 3b: Secondary | ICD-10-CM | POA: Diagnosis present

## 2019-01-03 DIAGNOSIS — F329 Major depressive disorder, single episode, unspecified: Secondary | ICD-10-CM | POA: Diagnosis present

## 2019-01-03 DIAGNOSIS — Z91030 Bee allergy status: Secondary | ICD-10-CM

## 2019-01-03 DIAGNOSIS — N390 Urinary tract infection, site not specified: Secondary | ICD-10-CM | POA: Diagnosis not present

## 2019-01-03 DIAGNOSIS — Z7901 Long term (current) use of anticoagulants: Secondary | ICD-10-CM | POA: Diagnosis not present

## 2019-01-03 DIAGNOSIS — I130 Hypertensive heart and chronic kidney disease with heart failure and stage 1 through stage 4 chronic kidney disease, or unspecified chronic kidney disease: Secondary | ICD-10-CM | POA: Diagnosis not present

## 2019-01-03 DIAGNOSIS — R5381 Other malaise: Secondary | ICD-10-CM | POA: Diagnosis not present

## 2019-01-03 DIAGNOSIS — Z7984 Long term (current) use of oral hypoglycemic drugs: Secondary | ICD-10-CM

## 2019-01-03 DIAGNOSIS — E1159 Type 2 diabetes mellitus with other circulatory complications: Secondary | ICD-10-CM | POA: Diagnosis present

## 2019-01-03 DIAGNOSIS — R531 Weakness: Secondary | ICD-10-CM | POA: Diagnosis not present

## 2019-01-03 DIAGNOSIS — Z20828 Contact with and (suspected) exposure to other viral communicable diseases: Secondary | ICD-10-CM | POA: Diagnosis present

## 2019-01-03 DIAGNOSIS — R0602 Shortness of breath: Secondary | ICD-10-CM | POA: Diagnosis not present

## 2019-01-03 DIAGNOSIS — Z7951 Long term (current) use of inhaled steroids: Secondary | ICD-10-CM

## 2019-01-03 DIAGNOSIS — Z86711 Personal history of pulmonary embolism: Secondary | ICD-10-CM | POA: Diagnosis present

## 2019-01-03 DIAGNOSIS — Z888 Allergy status to other drugs, medicaments and biological substances status: Secondary | ICD-10-CM

## 2019-01-03 DIAGNOSIS — D631 Anemia in chronic kidney disease: Secondary | ICD-10-CM | POA: Diagnosis present

## 2019-01-03 DIAGNOSIS — Z9119 Patient's noncompliance with other medical treatment and regimen: Secondary | ICD-10-CM | POA: Diagnosis not present

## 2019-01-03 DIAGNOSIS — E118 Type 2 diabetes mellitus with unspecified complications: Secondary | ICD-10-CM | POA: Diagnosis present

## 2019-01-03 DIAGNOSIS — E119 Type 2 diabetes mellitus without complications: Secondary | ICD-10-CM | POA: Diagnosis not present

## 2019-01-03 DIAGNOSIS — I2699 Other pulmonary embolism without acute cor pulmonale: Secondary | ICD-10-CM | POA: Diagnosis present

## 2019-01-03 DIAGNOSIS — E559 Vitamin D deficiency, unspecified: Secondary | ICD-10-CM | POA: Diagnosis present

## 2019-01-03 DIAGNOSIS — K5909 Other constipation: Secondary | ICD-10-CM | POA: Diagnosis present

## 2019-01-03 DIAGNOSIS — J449 Chronic obstructive pulmonary disease, unspecified: Secondary | ICD-10-CM | POA: Diagnosis present

## 2019-01-03 DIAGNOSIS — I5032 Chronic diastolic (congestive) heart failure: Secondary | ICD-10-CM | POA: Diagnosis not present

## 2019-01-03 DIAGNOSIS — G40909 Epilepsy, unspecified, not intractable, without status epilepticus: Secondary | ICD-10-CM

## 2019-01-03 DIAGNOSIS — N189 Chronic kidney disease, unspecified: Secondary | ICD-10-CM | POA: Diagnosis not present

## 2019-01-03 DIAGNOSIS — M1990 Unspecified osteoarthritis, unspecified site: Secondary | ICD-10-CM | POA: Diagnosis present

## 2019-01-03 DIAGNOSIS — I2601 Septic pulmonary embolism with acute cor pulmonale: Secondary | ICD-10-CM | POA: Diagnosis not present

## 2019-01-03 DIAGNOSIS — N3000 Acute cystitis without hematuria: Secondary | ICD-10-CM | POA: Diagnosis not present

## 2019-01-03 DIAGNOSIS — E1122 Type 2 diabetes mellitus with diabetic chronic kidney disease: Secondary | ICD-10-CM | POA: Diagnosis present

## 2019-01-03 DIAGNOSIS — Z79899 Other long term (current) drug therapy: Secondary | ICD-10-CM | POA: Diagnosis not present

## 2019-01-03 DIAGNOSIS — F419 Anxiety disorder, unspecified: Secondary | ICD-10-CM | POA: Diagnosis present

## 2019-01-03 DIAGNOSIS — N183 Chronic kidney disease, stage 3 unspecified: Secondary | ICD-10-CM | POA: Diagnosis not present

## 2019-01-03 DIAGNOSIS — U071 COVID-19: Secondary | ICD-10-CM | POA: Diagnosis present

## 2019-01-03 DIAGNOSIS — M2550 Pain in unspecified joint: Secondary | ICD-10-CM | POA: Diagnosis not present

## 2019-01-03 DIAGNOSIS — J431 Panlobular emphysema: Secondary | ICD-10-CM | POA: Diagnosis not present

## 2019-01-03 DIAGNOSIS — Z8249 Family history of ischemic heart disease and other diseases of the circulatory system: Secondary | ICD-10-CM

## 2019-01-03 HISTORY — DX: Anemia, unspecified: D64.9

## 2019-01-03 LAB — BASIC METABOLIC PANEL
Anion gap: 6 (ref 5–15)
BUN: 20 mg/dL (ref 8–23)
CO2: 21 mmol/L — ABNORMAL LOW (ref 22–32)
Calcium: 7.9 mg/dL — ABNORMAL LOW (ref 8.9–10.3)
Chloride: 114 mmol/L — ABNORMAL HIGH (ref 98–111)
Creatinine, Ser: 1.33 mg/dL — ABNORMAL HIGH (ref 0.44–1.00)
GFR calc Af Amer: 45 mL/min — ABNORMAL LOW (ref >60)
GFR calc non Af Amer: 38 mL/min — ABNORMAL LOW (ref >60)
Glucose, Bld: 106 mg/dL — ABNORMAL HIGH (ref 70–99)
Potassium: 4.3 mmol/L (ref 3.5–5.1)
Sodium: 141 mmol/L (ref 135–145)

## 2019-01-03 LAB — CBC WITH DIFFERENTIAL/PLATELET
Abs Immature Granulocytes: 0.04 10*3/uL (ref 0.00–0.07)
Basophils Absolute: 0 10*3/uL (ref 0.0–0.1)
Basophils Relative: 0 %
Eosinophils Absolute: 0.1 10*3/uL (ref 0.0–0.5)
Eosinophils Relative: 1 %
HCT: 21 % — ABNORMAL LOW (ref 36.0–46.0)
Hemoglobin: 6.6 g/dL — ABNORMAL LOW (ref 12.0–15.0)
Immature Granulocytes: 1 %
Lymphocytes Relative: 11 %
Lymphs Abs: 0.9 10*3/uL (ref 0.7–4.0)
MCH: 25.9 pg — ABNORMAL LOW (ref 26.0–34.0)
MCHC: 31.4 g/dL (ref 30.0–36.0)
MCV: 82.4 fL (ref 80.0–100.0)
Monocytes Absolute: 0.7 10*3/uL (ref 0.1–1.0)
Monocytes Relative: 9 %
Neutro Abs: 6.4 10*3/uL (ref 1.7–7.7)
Neutrophils Relative %: 78 %
Platelets: 189 10*3/uL (ref 150–400)
RBC: 2.55 MIL/uL — ABNORMAL LOW (ref 3.87–5.11)
RDW: 18.7 % — ABNORMAL HIGH (ref 11.5–15.5)
WBC: 8.1 10*3/uL (ref 4.0–10.5)
nRBC: 0 % (ref 0.0–0.2)

## 2019-01-03 LAB — URINALYSIS, COMPLETE (UACMP) WITH MICROSCOPIC
Bacteria, UA: NONE SEEN
Bilirubin Urine: NEGATIVE
Glucose, UA: NEGATIVE mg/dL
Hgb urine dipstick: NEGATIVE
Ketones, ur: NEGATIVE mg/dL
Nitrite: NEGATIVE
Protein, ur: 30 mg/dL — AB
Specific Gravity, Urine: 1.012 (ref 1.005–1.030)
WBC, UA: >50 WBC/hpf — ABNORMAL HIGH (ref 0–5)
pH: 7 (ref 5.0–8.0)

## 2019-01-03 LAB — LACTIC ACID, PLASMA: Lactic Acid, Venous: 0.6 mmol/L (ref 0.5–1.9)

## 2019-01-03 LAB — TROPONIN I (HIGH SENSITIVITY): Troponin I (High Sensitivity): 5 ng/L (ref <18)

## 2019-01-03 LAB — POC SARS CORONAVIRUS 2 AG: SARS Coronavirus 2 Ag: POSITIVE — AB

## 2019-01-03 LAB — PROCALCITONIN: Procalcitonin: 0.18 ng/mL

## 2019-01-03 LAB — C-REACTIVE PROTEIN: CRP: 16 mg/dL — ABNORMAL HIGH (ref <1.0)

## 2019-01-03 LAB — PREPARE RBC (CROSSMATCH)

## 2019-01-03 MED ORDER — LEVETIRACETAM ER 500 MG PO TB24
1000.0000 mg | ORAL_TABLET | Freq: Every day | ORAL | Status: DC
  Administered 2019-01-04 – 2019-01-11 (×8): 1000 mg via ORAL
  Filled 2019-01-03 (×8): qty 2

## 2019-01-03 MED ORDER — VANCOMYCIN VARIABLE DOSE PER UNSTABLE RENAL FUNCTION (PHARMACIST DOSING): Status: DC

## 2019-01-03 MED ORDER — GUAIFENESIN-DM 100-10 MG/5ML PO SYRP
10.0000 mL | ORAL_SOLUTION | ORAL | Status: DC | PRN
  Administered 2019-01-06 – 2019-01-10 (×2): 10 mL via ORAL
  Filled 2019-01-03 (×2): qty 10

## 2019-01-03 MED ORDER — SODIUM CHLORIDE 0.9 % IV BOLUS (SEPSIS): 1000.0000 mL | Freq: Once | INTRAVENOUS | Status: DC

## 2019-01-03 MED ORDER — APIXABAN 5 MG PO TABS
5.0000 mg | ORAL_TABLET | Freq: Two times a day (BID) | ORAL | Status: DC
  Administered 2019-01-04 – 2019-01-11 (×15): 5 mg via ORAL
  Filled 2019-01-03 (×16): qty 1

## 2019-01-03 MED ORDER — ZINC SULFATE 220 (50 ZN) MG PO CAPS
220.0000 mg | ORAL_CAPSULE | Freq: Every day | ORAL | Status: DC
  Administered 2019-01-04 – 2019-01-11 (×8): 220 mg via ORAL
  Filled 2019-01-03 (×9): qty 1

## 2019-01-03 MED ORDER — ACETAMINOPHEN 325 MG PO TABS: 650.0000 mg | ORAL_TABLET | Freq: Four times a day (QID) | ORAL | Status: DC | PRN

## 2019-01-03 MED ORDER — SODIUM CHLORIDE 0.9 % IV SOLN: 10.0000 mL/h | Freq: Once | INTRAVENOUS | Status: DC

## 2019-01-03 MED ORDER — LINAGLIPTIN 5 MG PO TABS
5.0000 mg | ORAL_TABLET | Freq: Every day | ORAL | Status: DC
  Administered 2019-01-03: 11:00:00 5 mg via ORAL
  Filled 2019-01-03: qty 1

## 2019-01-03 MED ORDER — LACTATED RINGERS IV BOLUS
500.0000 mL | Freq: Once | INTRAVENOUS | Status: AC
  Administered 2019-01-03: 20:00:00 500 mL via INTRAVENOUS

## 2019-01-03 MED ORDER — FLUOXETINE HCL 20 MG PO CAPS
40.0000 mg | ORAL_CAPSULE | Freq: Every day | ORAL | Status: DC
  Administered 2019-01-03: 11:00:00 40 mg via ORAL
  Filled 2019-01-03: qty 2

## 2019-01-03 MED ORDER — LEVETIRACETAM ER 500 MG PO TB24
1000.0000 mg | ORAL_TABLET | Freq: Every day | ORAL | Status: DC
  Administered 2019-01-03: 11:00:00 1000 mg via ORAL
  Filled 2019-01-03: qty 2

## 2019-01-03 MED ORDER — NOREPINEPHRINE 4 MG/250ML-% IV SOLN: 0.0000 ug/min | INTRAVENOUS | Status: DC

## 2019-01-03 MED ORDER — HYDROCOD POLST-CPM POLST ER 10-8 MG/5ML PO SUER
5.0000 mL | Freq: Two times a day (BID) | ORAL | Status: DC | PRN
  Administered 2019-01-07 – 2019-01-11 (×3): 5 mL via ORAL
  Filled 2019-01-03 (×3): qty 5

## 2019-01-03 MED ORDER — SODIUM CHLORIDE 0.9 % IV SOLN
250.0000 mL | INTRAVENOUS | Status: DC | PRN
  Administered 2019-01-04: 250 mL via INTRAVENOUS

## 2019-01-03 MED ORDER — ATORVASTATIN CALCIUM 20 MG PO TABS
80.0000 mg | ORAL_TABLET | Freq: Every evening | ORAL | Status: DC
  Filled 2019-01-03 (×2): qty 4

## 2019-01-03 MED ORDER — PANTOPRAZOLE SODIUM 40 MG PO TBEC
40.0000 mg | DELAYED_RELEASE_TABLET | Freq: Every day | ORAL | Status: DC
  Administered 2019-01-03: 11:00:00 40 mg via ORAL
  Filled 2019-01-03: qty 1

## 2019-01-03 MED ORDER — SODIUM CHLORIDE 0.9 % IV BOLUS (SEPSIS)
500.0000 mL | Freq: Once | INTRAVENOUS | Status: AC
  Administered 2019-01-03: 500 mL via INTRAVENOUS

## 2019-01-03 MED ORDER — HYDRALAZINE HCL 50 MG PO TABS
25.0000 mg | ORAL_TABLET | Freq: Three times a day (TID) | ORAL | Status: DC
  Filled 2019-01-03: qty 0.5

## 2019-01-03 MED ORDER — ONDANSETRON HCL 4 MG/2ML IJ SOLN: 4.0000 mg | Freq: Four times a day (QID) | INTRAMUSCULAR | Status: DC | PRN

## 2019-01-03 MED ORDER — VITAMIN C 500 MG PO TABS
500.0000 mg | ORAL_TABLET | Freq: Every day | ORAL | Status: DC
  Administered 2019-01-04 – 2019-01-11 (×8): 500 mg via ORAL
  Filled 2019-01-03 (×9): qty 1

## 2019-01-03 MED ORDER — GABAPENTIN 300 MG PO CAPS
300.0000 mg | ORAL_CAPSULE | Freq: Every day | ORAL | Status: DC
  Administered 2019-01-03: 21:00:00 300 mg via ORAL
  Filled 2019-01-03: qty 1

## 2019-01-03 MED ORDER — SODIUM CHLORIDE 0.9 % IV SOLN
2.0000 g | Freq: Two times a day (BID) | INTRAVENOUS | Status: DC
  Administered 2019-01-03 (×2): 2 g via INTRAVENOUS
  Filled 2019-01-03 (×2): qty 2

## 2019-01-03 MED ORDER — SODIUM CHLORIDE 0.9 % IV BOLUS (SEPSIS)
1000.0000 mL | Freq: Once | INTRAVENOUS | Status: AC
  Administered 2019-01-03: 03:00:00 1000 mL via INTRAVENOUS

## 2019-01-03 MED ORDER — APIXABAN 5 MG PO TABS
5.0000 mg | ORAL_TABLET | Freq: Two times a day (BID) | ORAL | Status: DC
  Administered 2019-01-03 (×2): 5 mg via ORAL
  Filled 2019-01-03 (×2): qty 1

## 2019-01-03 MED ORDER — ONDANSETRON HCL 4 MG PO TABS
4.0000 mg | ORAL_TABLET | Freq: Four times a day (QID) | ORAL | Status: DC | PRN
  Administered 2019-01-10: 4 mg via ORAL
  Filled 2019-01-03: qty 1

## 2019-01-03 MED ORDER — SODIUM CHLORIDE 0.9% FLUSH
3.0000 mL | Freq: Two times a day (BID) | INTRAVENOUS | Status: DC
  Administered 2019-01-03 – 2019-01-11 (×15): 3 mL via INTRAVENOUS

## 2019-01-03 MED ORDER — SODIUM CHLORIDE 0.9 % IV BOLUS
1000.0000 mL | Freq: Once | INTRAVENOUS | Status: AC
  Administered 2019-01-03: 02:00:00 1000 mL via INTRAVENOUS

## 2019-01-03 MED ORDER — SODIUM CHLORIDE 0.9% FLUSH: 3.0000 mL | INTRAVENOUS | Status: DC | PRN

## 2019-01-03 NOTE — Consult Note (Signed)
Pharmacy Antibiotic Note  Katherine [name] is a 78 y.o. female admitted on 01/02/2019 with sepsis.  Pharmacy has been consulted for cefepime and vancomycin dosing. 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   Plan: Will start cefepime 2 g q12H.   Pt received loading dose vancomycin 2500 mg total 11/19 PM. Baseline Scr seems to be at 1.3-1.4. Last Scr was 1.90. No new creatinine today. Planning to transfer to [place] when beds available. Will order a creatinine and vancomycin random level at 2200 and re-dose accordingly.   Pharmacy will continue to follow and monitor.    Height: 5' (152.4 cm) Weight: 227 lb 8 oz (103.2 kg) IBW/kg (Calculated) : 45.5  Temp (24hrs), Avg:99.1 F (37.3 C), Min:97.9 F (36.6 C), Max:101 F (38.3 C)  Recent Labs  Lab 12/28/18 0254 12/29/18 0422 12/30/18 0320 12/31/18 0420 01/02/19 2302  WBC 11.9* 5.4 4.9 6.4 9.5  CREATININE 1.34* 1.31* 1.42* 1.30* 1.70*  LATICACIDVEN  --   --   --   --  1.1    Estimated Creatinine Clearance: 30 mL/min (A) (by C-G formula based on SCr of 1.7 mg/dL (H)).    Allergies  Allergen Reactions  . Bee Venom Swelling  . Enalapril Maleate Swelling    Antimicrobials this admission: 11/19 cefepime >>  11/19 vancomycin >>   Dose adjustments this admission: None  Microbiology results: 11/19 BCx: pending 11/19 UCx: pending   11/7 MRSA PCR: negative  Thank you for allowing pharmacy to be a part of this patient's care.  Oswald Hillock, PharmD, BCPS 01/03/2019 10:16 AM

## 2019-01-03 NOTE — ED Notes (Signed)
Patient's husband notified of patient transfer.

## 2019-01-03 NOTE — ED Notes (Signed)
Report received from [facility]. Patient care assumed. Patient/RN introduction complete. Will continue to monitor. Pt resting comfortably denies any shob or pain, Pt has ivf infusing well and IV site to right AC and right forearm intact. PT awaiting transfer to [place] pending improvement of VS.

## 2019-01-03 NOTE — ED Notes (Signed)
Meal tray given to pt.

## 2019-01-03 NOTE — ED Notes (Signed)
ED Provider at bedside. 

## 2019-01-03 NOTE — ED Notes (Signed)
Pt still awaiting bed assignment at [facility]. No distress pt resting comfortably.

## 2019-01-03 NOTE — Sepsis Progress Note (Signed)
Notified bedside nurse of need to administer fluid bolus.  

## 2019-01-03 NOTE — Progress Notes (Signed)
Pt arrived from [facility] ED via care link. Md notified of pt arrival. Pt A & O x 2, disoriented to place & time. Easy to reorient. Pt on 4 liters O2 N/C, per MD orders wean down as tolerated. No c/o pain. Admission questions and assessment done. Oriented to set up and routine. Telemetry monitoring in place. Pt resting comfortably. Will continue to monitor.

## 2019-01-03 NOTE — ED Notes (Signed)
Verbal consent given from patient for blood transfusion.

## 2019-01-03 NOTE — ED Notes (Signed)
PRBCs given to care link to transfuse during transfer.

## 2019-01-03 NOTE — ED Provider Notes (Signed)
-----------------------------------------   12:45 AM on 01/03/2019 -----------------------------------------  Blood pressure improved with IV fluids.  Called [facility] which is full and not accepting patients. Will discuss with hospitalist to evaluate patient in the emergency department for admission.   ----------------------------------------- 2:20 AM on 01/03/2019 -----------------------------------------  Hospitalist NP helping to arrange transfer to [facility] or [facility], stepdown/ICU. Will ask our CCU NP to help manage hypertension.  Discussed with Dr. Sidney Ace from hospitalist services who does recommend going ahead and starting Levophed.   ----------------------------------------- 2:32 AM on 01/03/2019 -----------------------------------------  Spoke with Dr. Rojelio Brenner from [facility] who accepts patient in transfer.  Advised  30 cc/kg IV fluids first and will call back with blood pressure status to determine bed status required.   ----------------------------------------- 3:47 AM on 01/03/2019 -----------------------------------------  BP 106/59 with 1.5 L IV fluids left ago.  Updated Dr. Rojelio Brenner.  Unit assigned; no beds available at this time.  Patient will be placed on the wait list.   ----------------------------------------- 6:27 AM on 01/03/2019 -----------------------------------------  Patient has been maintaining her blood pressures since finishing IV fluids.  Pertinent home medications have been entered in the event patient has a long wait for a bed at [facility].    Paulette Blanch, MD 01/03/19 [phone]

## 2019-01-03 NOTE — ED Provider Notes (Addendum)
Patient continues to await [facility] bed. I was asked by nursing to evaluate for borderline hypotension. Pt has received multiple boluses overnight and has been on maintenance. She did appear to be dry on initial arrival. Her sats are stable. Will re-check a CBC, BMP, give LR 500 cc, re-assess.   BP stable after fluids. Labs repeated here. Of note, Hgb 6.6. I suspect this is 2/2 chronic anemia w/ hemodilution from IVF. However, will plan to transfuse.  Type and screen sent but transport now available. Pt remains HDS. I have reassessed her and she is calm, cooperative, satting well on [place], in no distress. She appears euvolemic on exam. Mentating well. Blood is here and will transfuse - pt consented. Hospitalist at [facility] paged so they are aware of recent labs.  CRITICAL CARE Performed by: Evonnie Pat   Total critical care time: 35 minutes  Critical care time was exclusive of separately billable procedures and treating other patients.  Critical care was necessary to treat or prevent imminent or life-threatening deterioration.  Critical care was time spent personally by me on the following activities: development of treatment plan with patient and/or surrogate as well as nursing, discussions with consultants, evaluation of patient's response to treatment, examination of patient, obtaining history from patient or surrogate, ordering and performing treatments and interventions, ordering and review of laboratory studies, ordering and review of radiographic studies, pulse oximetry and re-evaluation of patient's condition.    Duffy Bruce, MD 01/03/19 Jeananne Rama    Duffy Bruce, MD 01/03/19 [phone]

## 2019-01-04 ENCOUNTER — Encounter (HOSPITAL_COMMUNITY): Payer: Self-pay

## 2019-01-04 ENCOUNTER — Other Ambulatory Visit: Payer: Self-pay

## 2019-01-04 DIAGNOSIS — N179 Acute kidney failure, unspecified: Secondary | ICD-10-CM

## 2019-01-04 DIAGNOSIS — N189 Chronic kidney disease, unspecified: Secondary | ICD-10-CM

## 2019-01-04 LAB — COMPREHENSIVE METABOLIC PANEL
ALT: 24 U/L (ref 0–44)
AST: 21 U/L (ref 15–41)
Albumin: 2 g/dL — ABNORMAL LOW (ref 3.5–5.0)
Alkaline Phosphatase: 90 U/L (ref 38–126)
Anion gap: 5 (ref 5–15)
BUN: 19 mg/dL (ref 8–23)
CO2: 23 mmol/L (ref 22–32)
Calcium: 8.2 mg/dL — ABNORMAL LOW (ref 8.9–10.3)
Chloride: 113 mmol/L — ABNORMAL HIGH (ref 98–111)
Creatinine, Ser: 1.32 mg/dL — ABNORMAL HIGH (ref 0.44–1.00)
GFR calc Af Amer: 45 mL/min — ABNORMAL LOW (ref >60)
GFR calc non Af Amer: 39 mL/min — ABNORMAL LOW (ref >60)
Glucose, Bld: 113 mg/dL — ABNORMAL HIGH (ref 70–99)
Potassium: 4.5 mmol/L (ref 3.5–5.1)
Sodium: 141 mmol/L (ref 135–145)
Total Bilirubin: 0.7 mg/dL (ref 0.3–1.2)
Total Protein: 5.7 g/dL — ABNORMAL LOW (ref 6.5–8.1)

## 2019-01-04 LAB — GLUCOSE, CAPILLARY
Glucose-Capillary: 97 mg/dL (ref 70–99)
Glucose-Capillary: 123 mg/dL — ABNORMAL HIGH (ref 70–99)
Glucose-Capillary: 130 mg/dL — ABNORMAL HIGH (ref 70–99)
Glucose-Capillary: 110 mg/dL — ABNORMAL HIGH (ref 70–99)

## 2019-01-04 LAB — TYPE AND SCREEN
ABO/RH(D): O POS
Antibody Screen: NEGATIVE
Unit division: 0
Unit division: 0

## 2019-01-04 LAB — CBC WITH DIFFERENTIAL/PLATELET
Abs Immature Granulocytes: 0.04 10*3/uL (ref 0.00–0.07)
Basophils Absolute: 0 10*3/uL (ref 0.0–0.1)
Basophils Relative: 0 %
Eosinophils Absolute: 0.1 10*3/uL (ref 0.0–0.5)
Eosinophils Relative: 1 %
HCT: 27.4 % — ABNORMAL LOW (ref 36.0–46.0)
Hemoglobin: 8.5 g/dL — ABNORMAL LOW (ref 12.0–15.0)
Immature Granulocytes: 1 %
Lymphocytes Relative: 12 %
Lymphs Abs: 0.9 10*3/uL (ref 0.7–4.0)
MCH: 27.6 pg (ref 26.0–34.0)
MCHC: 31 g/dL (ref 30.0–36.0)
MCV: 89 fL (ref 80.0–100.0)
Monocytes Absolute: 0.8 10*3/uL (ref 0.1–1.0)
Monocytes Relative: 10 %
Neutro Abs: 5.8 10*3/uL (ref 1.7–7.7)
Neutrophils Relative %: 76 %
Platelets: 205 10*3/uL (ref 150–400)
RBC: 3.08 MIL/uL — ABNORMAL LOW (ref 3.87–5.11)
RDW: 17.5 % — ABNORMAL HIGH (ref 11.5–15.5)
WBC: 7.6 10*3/uL (ref 4.0–10.5)
nRBC: 0 % (ref 0.0–0.2)

## 2019-01-04 LAB — BPAM RBC
Blood Product Expiration Date: 202012162359
Blood Product Expiration Date: 202012252359
ISSUE DATE / TIME: 202011202251
Unit Type and Rh: 5100
Unit Type and Rh: 5100

## 2019-01-04 LAB — C-REACTIVE PROTEIN: CRP: 19.7 mg/dL — ABNORMAL HIGH (ref <1.0)

## 2019-01-04 LAB — URINE CULTURE: Culture: 80000 — AB

## 2019-01-04 LAB — ABO/RH: ABO/RH(D): O POS

## 2019-01-04 LAB — LACTIC ACID, PLASMA: Lactic Acid, Venous: 1.4 mmol/L (ref 0.5–1.9)

## 2019-01-04 LAB — D-DIMER, QUANTITATIVE: D-Dimer, Quant: 3.27 ug/mL-FEU — ABNORMAL HIGH (ref 0.00–0.50)

## 2019-01-04 MED ORDER — SODIUM CHLORIDE 0.9 % IV SOLN
1.0000 g | Freq: Two times a day (BID) | INTRAVENOUS | Status: AC
  Administered 2019-01-04 – 2019-01-10 (×14): 1 g via INTRAVENOUS
  Filled 2019-01-04 (×16): qty 1

## 2019-01-04 MED ORDER — VANCOMYCIN HCL 10 G IV SOLR
1250.0000 mg | INTRAVENOUS | Status: DC
  Filled 2019-01-04: qty 1250

## 2019-01-04 MED ORDER — LACOSAMIDE 50 MG PO TABS
100.0000 mg | ORAL_TABLET | Freq: Two times a day (BID) | ORAL | Status: DC
  Administered 2019-01-04 – 2019-01-11 (×15): 100 mg via ORAL
  Filled 2019-01-04 (×15): qty 2

## 2019-01-04 MED ORDER — SODIUM CHLORIDE 0.9 % IV SOLN: 2.0000 g | Freq: Two times a day (BID) | INTRAVENOUS | Status: DC

## 2019-01-04 MED ORDER — LINAGLIPTIN 5 MG PO TABS
5.0000 mg | ORAL_TABLET | Freq: Every day | ORAL | Status: DC
  Administered 2019-01-04 – 2019-01-11 (×8): 5 mg via ORAL
  Filled 2019-01-04 (×9): qty 1

## 2019-01-04 MED ORDER — GABAPENTIN 300 MG PO CAPS
300.0000 mg | ORAL_CAPSULE | Freq: Every day | ORAL | Status: DC
  Administered 2019-01-04 – 2019-01-10 (×7): 300 mg via ORAL
  Filled 2019-01-04: qty 3
  Filled 2019-01-04 (×6): qty 1

## 2019-01-04 MED ORDER — PANTOPRAZOLE SODIUM 40 MG PO TBEC
40.0000 mg | DELAYED_RELEASE_TABLET | Freq: Every day | ORAL | Status: DC
  Administered 2019-01-04 – 2019-01-11 (×8): 40 mg via ORAL
  Filled 2019-01-04 (×8): qty 1

## 2019-01-04 MED ORDER — ATORVASTATIN CALCIUM 40 MG PO TABS
80.0000 mg | ORAL_TABLET | Freq: Every evening | ORAL | Status: DC
  Administered 2019-01-04 – 2019-01-10 (×7): 80 mg via ORAL
  Filled 2019-01-04 (×7): qty 2

## 2019-01-04 MED ORDER — INSULIN ASPART 100 UNIT/ML ~~LOC~~ SOLN
0.0000 [IU] | Freq: Three times a day (TID) | SUBCUTANEOUS | Status: DC
  Administered 2019-01-04 – 2019-01-10 (×4): 1 [IU] via SUBCUTANEOUS

## 2019-01-04 MED ORDER — FLUOXETINE HCL 20 MG PO CAPS
40.0000 mg | ORAL_CAPSULE | Freq: Every day | ORAL | Status: DC
  Administered 2019-01-04 – 2019-01-11 (×8): 40 mg via ORAL
  Filled 2019-01-04 (×8): qty 2

## 2019-01-04 NOTE — Consult Note (Signed)
Pharmacy Antibiotic Note  Katherine [name] is a 78 y.o. female re-admitted to [place] 01/03/2019 with sepsis.  She was discharged from [facility] on 12-31-18 to [facility], where she was found unresponsive in bed tonight.  While at [facility], she completed a course of Remdesivir and Unasyn for aspiration pneumonia.   Pharmacy has been consulted to dose meropenem for possible ESBL infection.    Plan: DC cefepime and vancomycin Start meropenem 1g IV q12h     Pharmacy will continue to monitor renal function, cultures and patient progress.   Height: 5\' 3"  (160 cm) Weight: 234 lb 2.1 oz (106.2 kg) IBW/kg (Calculated) : 52.4  Temp (24hrs), Avg:98.4 F (36.9 C), Min:97.9 F (36.6 C), Max:98.9 F (37.2 C)  Recent Labs  Lab 12/29/18 0422 12/30/18 0320 12/31/18 0420 01/02/19 2302 01/03/19 1947  WBC 5.4 4.9 6.4 9.5 8.1  CREATININE 1.31* 1.42* 1.30* 1.70* 1.33*  LATICACIDVEN  --   --   --  1.1 0.6    Estimated Creatinine Clearance: 41.3 mL/min (A) (by C-G formula based on SCr of 1.33 mg/dL (H)).    Allergies  Allergen Reactions  . Bee Venom Swelling  . Enalapril Maleate Swelling    Antimicrobials this admission: cefepime 11/19  >> 11/21 vancomycin 11/19 >> 11/21 Unasyn 11/12>>11/18 remdesivir  11/6>>11/10 ertapenem 11/6>>11/6 meropenem 11/21>>    Microbiology results: 11/19 BC x2: NG <12h 11/19 UCx: pending   11/7 MRSA PCR: negative 11/7 C. Diff PCR: neg 11/5 SARS CoV-2: +  Thank you for allowing pharmacy to participate in this patient's care.  Despina Pole, Pharm. D. Clinical Pharmacist 01/04/2019 5:07 AM

## 2019-01-04 NOTE — H&P (Signed)
History and Physical    [name] [id number] DOB: 1940-05-31 DOA: 01/03/2019  PCP: Valerie Roys, DO  Patient coming from: snf  Chief Complaint:  fever  HPI: Katherine [name] is a 78 y.o. 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  On arrival there pt septic with uti, cxr unimpressive, bp was low but oxygen sats were nml.  Responded well to ivf.  Given vanc and cefepime.  Repeat covid testing is positive (entered WRONG in epic, test was entered as negative and is WRONG).  Pt referred here for sepsis due to uti with a persistent positive covid test.   Review of Systems: As per HPI otherwise 10 point review of systems negative.   Past Medical History:  Diagnosis Date  . Angioedema   . Anxiety and depression   . CHF (congestive heart failure) ([place])   . Chronic constipation   . Chronic kidney disease    stage 3  . COPD (chronic obstructive pulmonary disease) ([place])   . Diabetes mellitus without complication ([place])   . Gallstones   . GERD (gastroesophageal reflux disease)   . Hypertension   . Left ventricular hypertrophy   . Osteoarthritis   . Prurigo nodularis   . Seizures ([place])   . Stroke ([place])   . Tobacco abuse   . Vitamin D deficiency disease     Past Surgical History:  Procedure Laterality Date  . CHOLECYSTECTOMY    . 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   Procedure: TOTAL KNEE ARTHROPLASTY;  Surgeon: Hessie Knows, MD;  Location: [facility] ORS;  Service: Orthopedics;  Laterality: Right;     reports that she has quit smoking. Her smoking use included cigarettes. She has a 30.00 pack-year smoking history. She has never used smokeless tobacco. She reports that she does not drink alcohol or use drugs.  Allergies   Allergen Reactions  . Bee Venom Swelling  . Enalapril Maleate Swelling    Family History  Problem Relation Age of Onset  . Alcohol abuse Mother   . Sickle cell anemia Daughter   . Hypertension Son   . Cancer Neg Hx   . COPD Neg Hx   . Diabetes Neg Hx   . Heart disease Neg Hx   . Stroke Neg Hx     Prior to Admission medications   Medication Sig Start Date End Date Taking? Authorizing Provider  acetaminophen (TYLENOL) 650 MG CR tablet Take 650 mg by mouth 3 (three) times daily.     [provider]  albuterol (ACCUNEB) 0.63 MG/3ML nebulizer solution Take 3 mLs by nebulization every 8 (eight) hours as needed for wheezing.     [provider]  apixaban (ELIQUIS) 5 MG TABS tablet Take 1 tablet (5 mg total) by mouth 2 (two) times daily. To initiate this on 11/29/2018 after completing the 10 mg twice daily treatment regimen 11/29/18   Otila Back, MD  atorvastatin (LIPITOR) 80 MG tablet Take 80 mg by mouth every evening.     [provider]  cetirizine (ZYRTEC) 10 MG tablet Take 5 mg by mouth at bedtime.    [provider]  cholecalciferol (VITAMIN D) 1000 units tablet Take 1,000 Units by mouth daily.  [provider]  diclofenac sodium (VOLTAREN) 1 % GEL Apply 2 g topically every 12 (twelve) hours as needed (for left knee pain).     [provider]  docusate sodium (COLACE) 100 MG capsule Take 100 mg by mouth 2 (two) times daily.    [provider]  FLUoxetine (PROZAC) 40 MG capsule Take 40 mg by mouth daily.     [provider]  Fluticasone-Umeclidin-Vilant 100-62.5-25 MCG/INH AEPB Inhale 1 puff into the lungs daily.    [provider]  gabapentin (NEURONTIN) 300 MG capsule Take 300 mg by mouth at bedtime.    [provider]  hydrALAZINE (APRESOLINE) 25 MG tablet Take 25 mg by mouth 3 (three) times daily. 06/19/18   [provider]  ipratropium (ATROVENT) 0.03 % nasal spray Place 2 sprays into  both nostrils 2 (two) times daily.     [provider]  ipratropium-albuterol (DUONEB) 0.5-2.5 (3) MG/3ML SOLN Take 3 mLs by nebulization 3 (three) times daily. 07/17/18   Gladstone Lighter, MD  lacosamide 100 MG TABS Take 1 tablet (100 mg total) by mouth 2 (two) times daily. 12/31/18   Thurnell Lose, MD  levETIRAcetam (KEPPRA XR) 500 MG 24 hr tablet Take 2 tablets (1,000 mg total) by mouth daily. 01/01/19   Thurnell Lose, MD  linagliptin (TRADJENTA) 5 MG TABS tablet Take 5 mg by mouth daily.    [provider]  meclizine (ANTIVERT) 25 MG tablet Take 25 mg by mouth every 12 (twelve) hours as needed for dizziness.     [provider]  Multiple Vitamin (MULTIVITAMIN WITH MINERALS) TABS tablet Take 1 tablet by mouth daily.    [provider]  mupirocin ointment (BACTROBAN) 2 % Place 1 application into the nose 2 (two) times daily. 06/13/18   Loletha Grayer, MD  nitroGLYCERIN (NITROSTAT) 0.4 MG SL tablet Place 0.4 mg under the tongue every 5 (five) minutes as needed for chest pain.  08/07/18   [provider]  pantoprazole (PROTONIX) 40 MG tablet Take 1 tablet (40 mg total) by mouth daily. 04/29/18   Bettey Costa, MD  vitamin B-12 1000 MCG tablet Take 1 tablet (1,000 mcg total) by mouth daily. 04/30/18   Bettey Costa, MD    Physical Exam: Vitals:   01/03/19 2353  BP: 123/75  Pulse: 85  Resp: 20  Temp: 98.2 F (36.8 C)  TempSrc: Oral  SpO2: 97%      Constitutional: NAD, calm, comfortable Vitals:   01/03/19 2353  BP: 123/75  Pulse: 85  Resp: 20  Temp: 98.2 F (36.8 C)  TempSrc: Oral  SpO2: 97%   Eyes: PERRL, lids and conjunctivae normal ENMT: Mucous membranes are moist. Posterior pharynx clear of any exudate or lesions.Normal dentition.  Neck: normal, supple, no masses, no thyromegaly Respiratory: clear to auscultation bilaterally, no wheezing, no crackles. Normal respiratory effort. No accessory muscle use.  Cardiovascular: Regular  rate and rhythm, no murmurs / rubs / gallops. No extremity edema. 2+ pedal pulses. No carotid bruits.  Abdomen: no tenderness, no masses palpated. No hepatosplenomegaly. Bowel sounds positive.  Musculoskeletal: no clubbing / cyanosis. No joint deformity upper and lower extremities. Good ROM, no contractures. Normal muscle tone.  Skin: no rashes, lesions, ulcers. No induration Neurologic: CN 2-12 grossly intact. Sensation intact, DTR normal. Strength 5/5 in all 4.  Psychiatric: Normal judgment and insight. Alert and oriented x 2. Normal mood.    Labs on Admission: I have personally reviewed following labs and imaging studies  CBC: Recent Labs  Lab 12/29/18 0422 12/30/18 0320 12/31/18 0420 01/02/19 2302 01/03/19 1947  WBC 5.4 4.9 6.4 9.5 8.1  NEUTROABS 4.3 3.7 3.9 7.2 6.4  HGB 7.3* 7.6* 7.5* 7.9* 6.6*  HCT 23.4* 24.1* 23.9* 25.1* 21.0*  MCV 84.8 84.0 83.6 83.1 82.4  PLT 263 272 237 245 [id number]   Basic Metabolic Panel: Recent Labs  Lab 12/28/18 0254 12/29/18 0422 12/30/18 0320 12/31/18 0420 01/02/19 2302 01/03/19 1947  NA 136 138 140 138 137 141  K 4.5 4.8 5.0 4.6 4.3 4.3  CL 108 108 107 105 103 114*  CO2 22 23 25 25 24  21*  GLUCOSE 113* 114* 114* 67* 90 106*  BUN 23 20 23  26* 26* 20  CREATININE 1.34* 1.31* 1.42* 1.30* 1.70* 1.33*  CALCIUM 8.0* 8.4* 8.8* 8.6* 8.6* 7.9*  MG 1.8 2.0 2.0 2.0  --   --    GFR: Estimated Creatinine Clearance: 38.4 mL/min (A) (by C-G formula based on SCr of 1.33 mg/dL (H)). Liver Function Tests: Recent Labs  Lab 12/28/18 0254 12/29/18 0422 12/30/18 0320 12/31/18 0420 01/02/19 2302  AST 24 25 37 39 21  ALT 23 26 35 38 26  ALKPHOS 103 104 115 99 98  BILITOT 0.9 0.6 0.2* 0.4 0.8  PROT 6.0* 6.1* 6.2* 5.8* 6.5  ALBUMIN 2.0* 2.1* 2.2* 2.1* 2.3*   No results for input(s): LIPASE, AMYLASE in the last 168 hours. No results for input(s): AMMONIA in the last 168 hours. Coagulation Profile: Recent Labs  Lab 01/02/19 2302  INR 1.6*   Cardiac  Enzymes: No results for input(s): CKTOTAL, CKMB, CKMBINDEX, TROPONINI in the last 168 hours. BNP (last 3 results) No results for input(s): PROBNP in the last 8760 hours. HbA1C: No results for input(s): HGBA1C in the last 72 hours. CBG: Recent Labs  Lab 12/30/18 1122 12/30/18 1615 12/30/18 2010 12/31/18 0804 12/31/18 1402  GLUCAP 109* 80 143* 79 92   Lipid Profile: No results for input(s): CHOL, HDL, LDLCALC, TRIG, CHOLHDL, LDLDIRECT in the last 72 hours. Thyroid Function Tests: No results for input(s): TSH, T4TOTAL, FREET4, T3FREE, THYROIDAB in the last 72 hours. Anemia Panel: No results for input(s): VITAMINB12, FOLATE, FERRITIN, TIBC, IRON, RETICCTPCT in the last 72 hours. Urine analysis:    Component Value Date/Time   COLORURINE YELLOW (A) 01/02/2019 2303   APPEARANCEUR CLOUDY (A) 01/02/2019 2303   APPEARANCEUR Clear 02/20/2013 1459   LABSPEC 1.012 01/02/2019 2303   LABSPEC 1.012 02/20/2013 1459   PHURINE 7.0 01/02/2019 2303   GLUCOSEU NEGATIVE 01/02/2019 2303   GLUCOSEU Negative 02/20/2013 1459   HGBUR NEGATIVE 01/02/2019 2303   BILIRUBINUR NEGATIVE 01/02/2019 2303   BILIRUBINUR Negative 02/20/2013 [place] 01/02/2019 2303   PROTEINUR 30 (A) 01/02/2019 2303   NITRITE NEGATIVE 01/02/2019 2303   LEUKOCYTESUR LARGE (A) 01/02/2019 2303   LEUKOCYTESUR Negative 02/20/2013 1459   Sepsis Labs: !!!!!!!!!!!!!!!!!!!!!!!!!!!!!!!!!!!!!!!!!!!! @LABRCNTIP (procalcitonin:4,lacticidven:4) ) Recent Results (from the past 240 hour(s))  Blood Culture (routine x 2)     Status: None (Preliminary result)   Collection Time: 01/02/19 11:03 PM   Specimen: BLOOD  Result Value Ref Range Status   Specimen Description BLOOD RIGHT FOREARM  Final   Special Requests   Final    BOTTLES DRAWN AEROBIC AND ANAEROBIC Blood Culture results may not be optimal due to an inadequate volume of blood received in culture bottles   Culture   Final    NO GROWTH < 12 HOURS Performed at  [facility], [address] [place], [place] [address]    Report Status PENDING  Incomplete  Blood Culture (routine x 2)     Status: None (Preliminary result)   Collection Time: 01/02/19 11:03 PM   Specimen: BLOOD  Result Value Ref Range Status   Specimen Description BLOOD RIGHT ASSIST CONTROL  Final   Special Requests   Final    BOTTLES DRAWN AEROBIC AND ANAEROBIC Blood Culture adequate volume   Culture   Final    NO GROWTH < 12 HOURS Performed at [facility], [address], [place], [place] [address]    Report Status PENDING  Incomplete     Radiological Exams on Admission: Dg Chest Port 1 View  Result Date: 01/02/2019 CLINICAL DATA:  Shortness of breath and fever EXAM: PORTABLE CHEST 1 VIEW COMPARISON:  12/27/2018 FINDINGS: Artifact overlies the chest. The left chest is clear except for minimal atelectasis or scarring at the left base. Small amount of pleural fluid remains on the right with mild right base atelectasis. No worsening or new finding. IMPRESSION: Small amount of pleural fluid on the right with mild right base atelectasis. No acute finding or significant change since priors. Electronically Signed   By: Nelson Chimes M.D.   On: 01/02/2019 21:57   Old chart reviewed cxr reviewed no edema ,infiltrate or opacities  Assessment/Plan 78 yo female with sepsis from uti h/o esbl, also recovering from covid  Principal Problem:   UTI (urinary tract infection)- has been on augmentin po but think more for aspiration pna than uti.  Last uc with esbl back in July 2020, cant find uc from d/c last week as pt noted to be asymptomatic of uti last week.  Given vanc/cefepime in ED change to [facility].  uc and bc pending.  Also septic on presentation improved since yesterday with ivf.    Active Problems:   Sepsis ([place])- likely from uti.  abx as above.  bp low on arrival yesterday, much improved now with ivf resusciatation. Sepsis seems almost resolved.  F/u uc and bc.     Essential hypertension- hold bp meds at ths time due to above    COPD (chronic obstructive pulmonary disease) ([place])- stable    Acute kidney injury superimposed on CKD ([place])- cr bump up to 1.7, improved with ivf, follow daily.  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  Stop Ethel.  Monitor resp status with the use of ivf at the end of her covid infection.      Acute on chronic anemia- baseline hgb around8.  No overt bleeding.  hgb drop to 6.7 due to dilution from ivf.  Transfuse one unit prbc.  Monitor closely for any bleeding and response to transfusion     DVT prophylaxis: eloquis Code Status: full Family Communication:  none Disposition Plan:  A day or three Consults called:  none Admission status:  admission   Cherokee Boccio A MD [facility]  If 7PM-7AM, please contact night-coverage [email or web address] Password [email or web address]  01/04/2019, 12:00 AM

## 2019-01-04 NOTE — Consult Note (Signed)
Pharmacy Antibiotic Note  Katherine [name] is a 78 y.o. female re-admitted to [facility] 01/03/2019 with sepsis.  She was discharged from [facility] on 12-31-18 to [facility], where she was found unresponsive in bed tonight.  While at [facility], she completed a course of Remdesivir and Unasyn for aspiration pneumonia.   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   She received a total loading dose of vancomycin 2.5g in the ED at [facility] and has had no further doses due to  serum creatinine elevation  to 1.9mg /dL.  Serum creatinine has improved with hydration to 1.3mg /dL, which appears to be baseline for her.   Plan: Continue cefepime 2 g q12h Start vancomycin 1.25g IV q24h on 01/04/19 at 0600  Predicted AUC: 464.3 mcg*h/mL     Pharmacy will continue to monitor renal function, vancomycin levels as clinically appropriate,  cultures and patient progress.    Temp (24hrs), Avg:98.3 F (36.8 C), Min:97.9 F (36.6 C), Max:98.8 F (37.1 C)  Recent Labs  Lab 12/29/18 0422 12/30/18 0320 12/31/18 0420 01/02/19 2302 01/03/19 1947  WBC 5.4 4.9 6.4 9.5 8.1  CREATININE 1.31* 1.42* 1.30* 1.70* 1.33*  LATICACIDVEN  --   --   --  1.1 0.6    Estimated Creatinine Clearance: 38.4 mL/min (A) (by C-G formula based on SCr of 1.33 mg/dL (H)).    Allergies  Allergen Reactions  . Bee Venom Swelling  . Enalapril Maleate Swelling    Antimicrobials this admission: cefepime 11/19  >>  vancomycin 11/19 >>  Unasyn 11/12>>11/18 remdesivir  11/6>>11/10 ertapenem 11/6>>11/6    Microbiology results: 11/19 BC x2: NG <12h 11/19 UCx: pending   11/7 MRSA PCR: negative 11/7 C. Diff PCR: neg 11/5 SARS CoV-2: +  Thank you for allowing pharmacy to participate in this patient's care.  Despina Pole, Pharm. D. Clinical Pharmacist 01/04/2019 12:40 AM

## 2019-01-04 NOTE — Progress Notes (Signed)
PROGRESS NOTE                                                                                                                                                                                                             Patient Demographics:    Katherine [name], is a 78 y.o. female, DOB - 1940/06/15, [id number]  Outpatient Primary MD for the patient is Valerie Roys, DO   Admit date - 01/03/2019   LOS - 1  No chief complaint on file.      Brief Narrative: Patient is a 78 y.o. female with PMHx of PE on anticoagulation with Eliquis, CKD stage III, DM-2, HTN, COPD, seizures, CVA, chronic debility/deconditioning-virtually bedbound-nursing home resident-recent admission to [facility] from 11/7-11/17 with 19 pneumonitis-treated with steroids and remdesivir-Hospital course then was complicated episodes of unresponsiveness-thought to be secondary to breakthrough seizures-Vimpat was added to Keppra-patient was subsequently discharged to SNF on 11/17.  Patient presented to [facility] ED after a period off unresponsiveness at SNF-while in the ED-she was found to be febrile up to 101 F-she was thought to have sepsis with hypotension secondary to UTI and admitted to the hospitalist service (has history of ESBL E. coli).   Subjective:    [name] today is awake-alert-no major issues overnight per RN.   Assessment  & Plan :   Sepsis with hypotension secondary to UTI: Sepsis physiology improving-continue meropenem.  Blood cultures negative so far, urine culture pending.  Patient has prior history of ESBL E. coli.  AKI on CKD stage III: AKI hemodynamically mediated-improving with supportive care.  Recent history of Covid 19 Viral pneumonia: Appropriately treated last admission with steroids and remdesivir.  Chest x-ray on admission negative for pneumonia.  Suspect CRP elevated from UTI and acute issues-rather than active ongoing Covid  pneumonitis.  COVID-19 Labs: Recent Labs    01/02/19 2302 01/03/19 0326 01/04/19 0350  DDIMER  --   --  3.27*  LDH 136  --   --   CRP  --  16.0* 19.7*    Lab Results  Component Value Date   SARSCOV2NAA POSITIVE (A) 12/19/2018   SARSCOV2NAA NOT DETECTED 11/24/2018   [place] NEGATIVE 11/21/2018   [place] NEGATIVE 09/06/2018     COVID-19 Medications: Steroids:11/6>>11/16 Remdesivir: 11/6 >>11/10  DVT Prophylaxis  : Eliquis  Anemia: Secondary to chronic disease/CKD-probably slightly worse  due to acute illness.  No evidence of blood loss.  Follow.  Seizure disorder: Continue Vimpat/Keppra.  During last admission-episodes of unresponsiveness was thought to be due to breakthrough seizures.  Pulmonary embolism: Continue Eliquis  History of CVA: Continue Eliquis and statin  DM-2: Continue linagliptin-add SSI  GERD: Continue PPI  Weakness/deconditioning/bedbound status: Back to SNF when a bit more stable  Obesity: Estimated body mass index is 41.47 kg/m as calculated from the following:   Height as of this encounter: 5\' 3"  (1.6 m).   Weight as of this encounter: 106.2 kg.   Consults  :  None  Procedures  :  None  ABG:    Component Value Date/Time   PHART 7.389 12/24/2018 0900   PCO2ART 37.3 12/24/2018 0900   PO2ART 131.0 (H) 12/24/2018 0900   HCO3 22.5 12/24/2018 0900   TCO2 24 12/24/2018 0900   ACIDBASEDEF 2.0 12/24/2018 0900   O2SAT 99.0 12/24/2018 0900    Vent Settings: N/A  Condition - Stable  Family Communication  :  Spouse updated over the phone  Code Status :  Full Code  Diet :  Diet Order            Diet Heart Room service appropriate? Yes; Fluid consistency: Thin  Diet effective now               Disposition Plan  :  Remain hospitalized  Barriers to discharge: IV antibiotics  Antimicorbials  :    Anti-infectives (From admission, onward)   Start     Dose/Rate Route Frequency Ordered Stop   01/05/19 1000  ceFEPIme  (MAXIPIME) 2 g in sodium chloride 0.9 % 100 mL IVPB  Status:  Discontinued     2 g 200 mL/hr over 30 Minutes Intravenous Every 12 hours 01/04/19 0009 01/04/19 0507   01/04/19 0600  vancomycin (VANCOCIN) 1,250 mg in sodium chloride 0.9 % 250 mL IVPB  Status:  Discontinued     1,250 mg 166.7 mL/hr over 90 Minutes Intravenous Every 24 hours 01/04/19 0044 01/04/19 0506   01/04/19 0600  meropenem (MERREM) 1 g in sodium chloride 0.9 % 100 mL IVPB     1 g 200 mL/hr over 30 Minutes Intravenous Every 12 hours 01/04/19 0510        Inpatient Medications  Scheduled Meds: . apixaban  5 mg Oral BID  . levETIRAcetam  1,000 mg Oral Daily  . sodium chloride flush  3 mL Intravenous Q12H  . vitamin C  500 mg Oral Daily  . zinc sulfate  220 mg Oral Daily   Continuous Infusions: . sodium chloride 250 mL (01/04/19 0616)  . meropenem (MERREM) IV 1 g (01/04/19 0618)   PRN Meds:.sodium chloride, acetaminophen, chlorpheniramine-HYDROcodone, guaiFENesin-dextromethorphan, ondansetron **OR** ondansetron (ZOFRAN) IV, sodium chloride flush   Time Spent in minutes  25  See all Orders from today for further details   Oren Binet M.D on 01/04/2019 at 7:44 AM  To page go to [email or web address] - use universal password  [facility] -  Office  [phone]    Objective:   Vitals:   01/03/19 2353 01/04/19 0121 01/04/19 0452  BP: 123/75 125/76 (!) 155/74  Pulse: 85 73 77  Resp: 20 20 14   Temp: 98.2 F (36.8 C) 98.9 F (37.2 C) 98.4 F (36.9 C)  TempSrc: Oral Oral Axillary  SpO2: 97% 100% 100%  Weight: 106.2 kg    Height: 5\' 3"  (1.6 m)      Wt Readings from Last 3 Encounters:  01/03/19 106.2 kg  01/02/19 103.2 kg  12/27/18 102.5 kg    No intake or output data in the 24 hours ending 01/04/19 0744   Physical Exam Gen Exam: awake-not in any distress HEENT:atraumatic, normocephalic Chest: B/L clear to auscultation anteriorly CVS:S1S2 regular Abdomen:soft non tender, non  distended Extremities:no edema Neurology: Generalized weakness but seems to be moving all 4 extremities. Skin: no rash   Data Review:    CBC Recent Labs  Lab 12/30/18 0320 12/31/18 0420 01/02/19 2302 01/03/19 1947 01/04/19 0350  WBC 4.9 6.4 9.5 8.1 7.6  HGB 7.6* 7.5* 7.9* 6.6* 8.5*  HCT 24.1* 23.9* 25.1* 21.0* 27.4*  PLT 272 237 245 189 205  MCV 84.0 83.6 83.1 82.4 89.0  MCH 26.5 26.2 26.2 25.9* 27.6  MCHC 31.5 31.4 31.5 31.4 31.0  RDW 19.0* 19.5* 18.6* 18.7* 17.5*  LYMPHSABS 0.8 1.6 1.1 0.9 0.9  MONOABS 0.4 0.8 1.1* 0.7 0.8  EOSABS 0.0 0.0 0.0 0.1 0.1  BASOSABS 0.0 0.0 0.0 0.0 0.0    Chemistries  Recent Labs  Lab 12/29/18 0422 12/30/18 0320 12/31/18 0420 01/02/19 2302 01/03/19 1947 01/04/19 0350  NA 138 140 138 137 141 141  K 4.8 5.0 4.6 4.3 4.3 4.5  CL 108 107 105 103 114* 113*  CO2 23 25 25 24  21* 23  GLUCOSE 114* 114* 67* 90 106* 113*  BUN 20 23 26* 26* 20 19  CREATININE 1.31* 1.42* 1.30* 1.70* 1.33* 1.32*  CALCIUM 8.4* 8.8* 8.6* 8.6* 7.9* 8.2*  MG 2.0 2.0 2.0  --   --   --   AST 25 37 39 21  --  21  ALT 26 35 38 26  --  24  ALKPHOS 104 115 99 98  --  90  BILITOT 0.6 0.2* 0.4 0.8  --  0.7   ------------------------------------------------------------------------------------------------------------------ No results for input(s): CHOL, HDL, LDLCALC, TRIG, CHOLHDL, LDLDIRECT in the last 72 hours.  Lab Results  Component Value Date   HGBA1C 5.1 12/21/2018   ------------------------------------------------------------------------------------------------------------------ No results for input(s): TSH, T4TOTAL, T3FREE, THYROIDAB in the last 72 hours.  Invalid input(s): FREET3 ------------------------------------------------------------------------------------------------------------------ No results for input(s): VITAMINB12, FOLATE, FERRITIN, TIBC, IRON, RETICCTPCT in the last 72 hours.  Coagulation profile Recent Labs  Lab 01/02/19 2302  INR 1.6*     Recent Labs    01/04/19 0350  DDIMER 3.27*    Cardiac Enzymes No results for input(s): CKMB, TROPONINI, MYOGLOBIN in the last 168 hours.  Invalid input(s): CK ------------------------------------------------------------------------------------------------------------------    Component Value Date/Time   BNP 108.0 (H) 01/02/2019 2303    Micro Results Recent Results (from the past 240 hour(s))  Blood Culture (routine x 2)     Status: None (Preliminary result)   Collection Time: 01/02/19 11:03 PM   Specimen: BLOOD  Result Value Ref Range Status   Specimen Description BLOOD RIGHT FOREARM  Final   Special Requests   Final    BOTTLES DRAWN AEROBIC AND ANAEROBIC Blood Culture results may not be optimal due to an inadequate volume of blood received in culture bottles   Culture   Final    NO GROWTH 2 DAYS Performed at [facility], [address] [place], [place] [address]    Report Status PENDING  Incomplete  Blood Culture (routine x 2)     Status: None (Preliminary result)   Collection Time: 01/02/19 11:03 PM   Specimen: BLOOD  Result Value Ref Range Status   Specimen Description BLOOD RIGHT ASSIST CONTROL  Final  Special Requests   Final    BOTTLES DRAWN AEROBIC AND ANAEROBIC Blood Culture adequate volume   Culture   Final    NO GROWTH 2 DAYS Performed at [facility], [address] [place], [place] [address]    Report Status PENDING  Incomplete    Radiology Reports Dg Chest 2 View  Result Date: 12/19/2018 CLINICAL DATA:  Hemoptysis, cough EXAM: CHEST - 2 VIEW COMPARISON:  11/24/2018 FINDINGS: Increased markings in the lower lungs, likely scarring, similar to prior study. Heart is borderline in size. Aortic atherosclerosis. No acute airspace opacities or effusions. No acute bony abnormality. IMPRESSION: Bibasilar linear densities, likely scarring. No definite acute process. Electronically Signed   By: Rolm Baptise M.D.   On: 12/19/2018 20:59    Ct Head Wo Contrast  Result Date: 12/24/2018 CLINICAL DATA:  Altered level of consciousness EXAM: CT HEAD WITHOUT CONTRAST TECHNIQUE: Contiguous axial images were obtained from the base of the skull through the vertex without intravenous contrast. COMPARISON:  12/19/2018 FINDINGS: Brain: No evidence of acute infarction, hemorrhage, hydrocephalus, extra-axial collection or mass lesion/mass effect. Mild periventricular white matter hypodensity. Vascular: No hyperdense vessel or unexpected calcification. Skull: Normal. Negative for fracture or focal lesion. Sinuses/Orbits: No acute finding. Other: None. IMPRESSION: No acute intracranial pathology.  Small-vessel white matter disease. Electronically Signed   By: Eddie Candle M.D.   On: 12/24/2018 09:04   Ct Head Wo Contrast  Result Date: 12/19/2018 CLINICAL DATA:  Headache, on Eliquis EXAM: CT HEAD WITHOUT CONTRAST TECHNIQUE: Contiguous axial images were obtained from the base of the skull through the vertex without intravenous contrast. COMPARISON:  09/06/2018 FINDINGS: Brain: Patchy chronic small vessel disease throughout the deep white matter. No acute intracranial abnormality. Specifically, no hemorrhage, hydrocephalus, mass lesion, acute infarction, or significant intracranial injury. Vascular: No hyperdense vessel or unexpected calcification. Skull: No acute calvarial abnormality. Sinuses/Orbits: Visualized paranasal sinuses and mastoids clear. Orbital soft tissues unremarkable. 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 No acute intracranial abnormality. Electronically Signed   By: Rolm Baptise M.D.   On: 12/19/2018 21:00   Ct Angio Chest Pe W And/or Wo Contrast  Result Date: 12/20/2018 CLINICAL DATA:  Nose bleed.  Hemoptysis. EXAM: CT ANGIOGRAPHY CHEST WITH CONTRAST TECHNIQUE: Multidetector CT imaging of the chest was performed using the standard protocol during bolus administration of intravenous  contrast. Multiplanar CT image reconstructions and MIPs were obtained to evaluate the vascular anatomy. CONTRAST:  45mL OMNIPAQUE IOHEXOL 350 MG/ML SOLN COMPARISON:  11/21/2018 FINDINGS: Cardiovascular: Contrast injection is sufficient to demonstrate satisfactory opacification of the pulmonary arteries to the segmental level.Again identified are small subsegmental pulmonary emboli involving the left lower lobe, slightly improved from prior study. The main pulmonary artery is dilated measuring approximately 3.3 cm. Aortic calcifications are noted. The heart size is stable. Coronary artery calcifications are noted. Mediastinum/Nodes: --No mediastinal or hilar lymphadenopathy. --No axillary lymphadenopathy. --No supraclavicular lymphadenopathy. --Normal thyroid gland. --The esophagus is unremarkable Lungs/Pleura: There are worsening bibasilar airspace opacities. There is no pneumothorax. There are trace bilateral pleural effusions, right greater than left. The trachea is unremarkable. Upper Abdomen: No acute abnormality. Musculoskeletal: No chest wall abnormality. No acute or significant osseous findings. Review of the MIP images confirms the above findings. IMPRESSION: 1. Improving subsegmental pulmonary emboli involving the left lower lobe. 2. Worsening bibasilar airspace opacities which may represent atelectasis or infiltrates. 3. Trace bilateral pleural effusions. 4. Dilated main pulmonary artery which can be seen in patients with  elevated pulmonary artery pressures. Aortic Atherosclerosis (ICD10-I70.0). Electronically Signed   By: Constance Holster M.D.   On: 12/20/2018 01:09   Dg Chest Port 1 View  Result Date: 01/02/2019 CLINICAL DATA:  Shortness of breath and fever EXAM: PORTABLE CHEST 1 VIEW COMPARISON:  12/27/2018 FINDINGS: Artifact overlies the chest. The left chest is clear except for minimal atelectasis or scarring at the left base. Small amount of pleural fluid remains on the right with mild right  base atelectasis. No worsening or new finding. IMPRESSION: Small amount of pleural fluid on the right with mild right base atelectasis. No acute finding or significant change since priors. Electronically Signed   By: Nelson Chimes M.D.   On: 01/02/2019 21:57   Cxr Am  Result Date: 12/27/2018 CLINICAL DATA:  Shortness of breath. EXAM: PORTABLE CHEST 1 VIEW COMPARISON:  12/26/2018 FINDINGS: Lordotic technique is demonstrated as the lungs are adequately inflated with subtle linear density right midlung likely atelectasis and unchanged. Mild opacification over the right base unchanged likely small amount of pleural fluid/atelectasis. Minimal linear atelectasis left base. Mild stable cardiomegaly. Remainder of the exam is unchanged. IMPRESSION: Stable minimal bibasilar opacification likely bibasilar atelectasis with small amount right pleural fluid. Mild stable cardiomegaly. Electronically Signed   By: Marin Olp M.D.   On: 12/27/2018 08:07   Dg Chest Port 1 View  Result Date: 12/26/2018 CLINICAL DATA:  Shortness of breath. EXAM: PORTABLE CHEST 1 VIEW COMPARISON:  CTA chest, 12/20/2018. Chest radiographs, 12/19/2018 and older exams. FINDINGS: Cardiac silhouette is borderline to mildly enlarged. Aorta shows atherosclerotic calcifications and is prominent. No mediastinal or hilar masses. There are linear/reticular lung base opacities, greater on the right, also noted in the right mid lung, likely due to atelectasis. Small pleural effusions are suggested, which were noted on the prior chest CT. Stable prominent bronchovascular markings. No convincing pneumonia or pulmonary edema. No pneumothorax. Skeletal structures are grossly intact. IMPRESSION: 1. No convincing pneumonia or pulmonary edema. 2. Lung base opacities on the right have mildly increased when compared to the prior chest radiographs, most consistent with atelectasis. Small pleural effusions stable compared to the prior chest CTA. Electronically  Signed   By: Lajean Manes M.D.   On: 12/26/2018 09:42

## 2019-01-05 ENCOUNTER — Inpatient Hospital Stay (HOSPITAL_COMMUNITY): Payer: Medicare HMO

## 2019-01-05 DIAGNOSIS — N3000 Acute cystitis without hematuria: Secondary | ICD-10-CM

## 2019-01-05 LAB — CBC WITH DIFFERENTIAL/PLATELET
Abs Immature Granulocytes: 0.02 10*3/uL (ref 0.00–0.07)
Basophils Absolute: 0 10*3/uL (ref 0.0–0.1)
Basophils Relative: 0 %
Eosinophils Absolute: 0.1 10*3/uL (ref 0.0–0.5)
Eosinophils Relative: 2 %
HCT: 25.5 % — ABNORMAL LOW (ref 36.0–46.0)
Hemoglobin: 7.8 g/dL — ABNORMAL LOW (ref 12.0–15.0)
Immature Granulocytes: 0 %
Lymphocytes Relative: 16 %
Lymphs Abs: 0.9 10*3/uL (ref 0.7–4.0)
MCH: 26.9 pg (ref 26.0–34.0)
MCHC: 30.6 g/dL (ref 30.0–36.0)
MCV: 87.9 fL (ref 80.0–100.0)
Monocytes Absolute: 0.6 10*3/uL (ref 0.1–1.0)
Monocytes Relative: 10 %
Neutro Abs: 3.9 10*3/uL (ref 1.7–7.7)
Neutrophils Relative %: 72 %
Platelets: 192 10*3/uL (ref 150–400)
RBC: 2.9 MIL/uL — ABNORMAL LOW (ref 3.87–5.11)
RDW: 17.3 % — ABNORMAL HIGH (ref 11.5–15.5)
WBC: 5.5 10*3/uL (ref 4.0–10.5)
nRBC: 0 % (ref 0.0–0.2)

## 2019-01-05 LAB — COMPREHENSIVE METABOLIC PANEL
ALT: 21 U/L (ref 0–44)
AST: 16 U/L (ref 15–41)
Albumin: 1.9 g/dL — ABNORMAL LOW (ref 3.5–5.0)
Alkaline Phosphatase: 90 U/L (ref 38–126)
Anion gap: 5 (ref 5–15)
BUN: 17 mg/dL (ref 8–23)
CO2: 22 mmol/L (ref 22–32)
Calcium: 8.7 mg/dL — ABNORMAL LOW (ref 8.9–10.3)
Chloride: 114 mmol/L — ABNORMAL HIGH (ref 98–111)
Creatinine, Ser: 1.27 mg/dL — ABNORMAL HIGH (ref 0.44–1.00)
GFR calc Af Amer: 47 mL/min — ABNORMAL LOW (ref >60)
GFR calc non Af Amer: 41 mL/min — ABNORMAL LOW (ref >60)
Glucose, Bld: 98 mg/dL (ref 70–99)
Potassium: 4.1 mmol/L (ref 3.5–5.1)
Sodium: 141 mmol/L (ref 135–145)
Total Bilirubin: 0.4 mg/dL (ref 0.3–1.2)
Total Protein: 5.6 g/dL — ABNORMAL LOW (ref 6.5–8.1)

## 2019-01-05 LAB — GLUCOSE, CAPILLARY
Glucose-Capillary: 80 mg/dL (ref 70–99)
Glucose-Capillary: 83 mg/dL (ref 70–99)
Glucose-Capillary: 118 mg/dL — ABNORMAL HIGH (ref 70–99)

## 2019-01-05 LAB — C-REACTIVE PROTEIN: CRP: 14 mg/dL — ABNORMAL HIGH (ref <1.0)

## 2019-01-05 LAB — PROCALCITONIN: Procalcitonin: <0.1 ng/mL

## 2019-01-05 LAB — D-DIMER, QUANTITATIVE: D-Dimer, Quant: 3.9 ug/mL-FEU — ABNORMAL HIGH (ref 0.00–0.50)

## 2019-01-05 NOTE — Progress Notes (Signed)
Occupational Therapy Evaluation Patient Details Name: Katherine [name] MRN: [id number] DOB: [birth year] Today's Date: 01/05/2019    History of Present Illness Patient is a 78 y.o. female with PMHx of PE on anticoagulation with Eliquis, CKD stage III, DM-2, HTN, COPD, seizures, CVA, chronic debility/deconditioning-virtually bedbound-nursing home resident-recent admission to [facility] from 11/7-11/17 with 19 pneumonitis-treated with steroids and remdesivir-Hospital course then was complicated episodes of unresponsiveness-thought to be secondary to breakthrough seizures-Vimpat was added to Keppra-patient was subsequently discharged to SNF on 11/17.  Patient presented to [facility] ED after a period off unresponsiveness at SNF-while in the ED-she was found to be febrile up to 101 F-she was thought to have sepsis with hypotension secondary to UTI and admitted to the hospitalist service (has history of ESBL E. coli).   Clinical Impression   PTA pt living at a SNF. Per report, pt has been bed bound for the last year, requiring assist for all self-care and functional transfer tasks. Pt currently on 2L [place] with O2 SATs in high 90s throughout. Pt able to complete breathing exercises, reporting that she has an incentive spirometer and flutter valve at the SNF that she uses. No reports of anxiety with SOB. No further skilled OT services recommended at this time as pt is functioning near baseline for self-care and functional transfer tasks. Recommend pt return to SNF level of care at discharge.     Follow Up Recommendations  SNF    Equipment Recommendations       Recommendations for Other Services       Precautions / Restrictions Precautions Precautions: Fall Restrictions Weight Bearing Restrictions: No      Mobility Bed Mobility Overal bed mobility: Needs Assistance Bed Mobility: Rolling Rolling: Min assist            Transfers                      Balance                                            ADL either performed or assessed with clinical judgement   ADL   Eating/Feeding: Set up;Minimal assistance;Bed level   Grooming: Set up;Bed level;Minimal assistance   Upper Body Bathing: Moderate assistance;Bed level   Lower Body Bathing: Total assistance;Bed level   Upper Body Dressing : Bed level;Maximal assistance   Lower Body Dressing: Total assistance;Bed level       Toileting- Clothing Manipulation and Hygiene: Total assistance;+2 for physical assistance;+2 for safety/equipment;Bed level         General ADL Comments: Suspect pt is close to her baseline     Vision Baseline Vision/History: Wears glasses Wears Glasses: At all times       Perception     Praxis      Pertinent Vitals/Pain Pain Assessment: No/denies pain     Hand Dominance Right   Extremity/Trunk Assessment Upper Extremity Assessment Upper Extremity Assessment: Generalized weakness RUE Deficits / Details: increased weakness in RUE vs LUE due to previous CVA RUE Coordination: decreased fine motor;decreased gross motor   Lower Extremity Assessment Lower Extremity Assessment: Defer to PT evaluation       Communication Communication Communication: No difficulties   Cognition Arousal/Alertness: Awake/alert Behavior During Therapy: WFL for tasks assessed/performed Overall Cognitive Status: Within Functional Limits for tasks assessed  General Comments  Pt reports that staff at SNF assist her with all ADLs. Pt uses a bedpan at facility. Pt reports that she has not transferred out of bed "in a long time."    Exercises Exercises: Other exercises Other Exercises Other Exercises: Flutter valve x 10 Other Exercises: Incentive spirometer x 10   Shoulder Instructions      Home Living Family/patient expects to be discharged to:: Skilled nursing facility Living Arrangements: Other (Comment)(SNF)                                Additional Comments: Patient reports that she has been bed bound for a year, does not sit at the edge of bed, roll over , stand or walk or sit in a wc (reports her insurance won't pay for a w/c)      Prior Functioning/Environment Level of Independence: Needs assistance  Gait / Transfers Assistance Needed: bedbound x 1 yr; is able to assist with bed mobility ADL's / Homemaking Assistance Needed: total assist for bathing; can feed herself "but it takes a long time because of the stroke on my right side"            OT Problem List: Decreased strength;Decreased range of motion;Decreased activity tolerance;Impaired balance (sitting and/or standing);Cardiopulmonary status limiting activity;Obesity;Impaired UE functional use      OT Treatment/Interventions:      OT Goals(Current goals can be found in the care plan section) Acute Rehab OT Goals Patient Stated Goal: None stated OT Goal Formulation: All assessment and education complete, DC therapy  OT Frequency:     Barriers to D/C:            Co-evaluation              AM-PAC OT "6 Clicks" Daily Activity     Outcome Measure Help from another person eating meals?: A Little Help from another person taking care of personal grooming?: A Little Help from another person toileting, which includes using toliet, bedpan, or urinal?: Total Help from another person bathing (including washing, rinsing, drying)?: A Lot Help from another person to put on and taking off regular upper body clothing?: A Lot Help from another person to put on and taking off regular lower body clothing?: Total 6 Click Score: 12   End of Session Nurse Communication: Mobility status  Activity Tolerance: Patient tolerated treatment well Patient left: in bed;with call bell/phone within reach  OT Visit Diagnosis: Muscle weakness (generalized) (M62.81)                Time: [id number] OT Time Calculation (min): 22 min Charges:  OT General  Charges $OT Visit: 1 Visit OT Evaluation $OT Eval Moderate Complexity: 1 Mod  Mauri [place] OTR/L [phone]   Mauri [place] 01/05/2019, 12:45 PM

## 2019-01-05 NOTE — NC FL2 (Signed)
[place] LEVEL OF CARE SCREENING TOOL     IDENTIFICATION  Patient Name: Katherine [name] Birthdate: 12-Oct-1940 Sex: female Admission Date (Current Location): 01/03/2019  [facility] and [place] Number:  Herbalist and Address:  [facility] [place], [place] [address] [place], [place] [address]      Provider Number: [id number]  Attending Physician Name and Address:  Thurnell Lose, MD  Relative Name and Phone Number:       Current Level of Care: Hospital Recommended Level of Care: [place] Prior Approval Number:    Date Approved/Denied:   PASRR Number:    Discharge Plan: SNF    Current Diagnoses: Patient Active Problem List   Diagnosis Date Noted  . Acute on chronic anemia 01/03/2019  . COVID-19 12/21/2018  . COVID-19 virus infection 12/21/2018  . Hemoptysis 12/20/2018  . Pulmonary embolism ([place]) 11/21/2018  . Delirium   . UTI (urinary tract infection) 08/21/2018  . Chest pain 07/15/2018  . Palliative care encounter 05/10/2018  . Localized edema 05/10/2018  . Shortness of breath 05/10/2018  . Hematemesis 04/28/2018  . Influenza A 04/13/2018  . Acute on chronic congestive heart failure ([place])   . COPD with acute exacerbation ([place]) 02/24/2018  . Chronic diastolic heart failure ([place]) 12/31/2017  . Lymphedema 12/31/2017  . COPD exacerbation ([place]) 11/30/2017  . Urinary tract infection 11/22/2017  . 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[place]) 11/16/2017  . Diabetes mellitus type 2, uncomplicated ([place]) 0000000  . Pressure injury of skin 10/29/2017  . Acute kidney injury superimposed on CKD ([place]) 10/24/2017  . Sepsis ([place]) 10/24/2017  . Possible Seizures ([place]) 10/08/2017  . Hyperlipemia 10/08/2017  . 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 Stroke-like episode ([place]) s/p IV tpa 10/04/2017  . Palliative care by specialist   . Elevated rheumatoid factor 09/05/2017  . Frequent hospital admissions  08/22/2017  . Aphasia 06/28/2017  . Hand pain 03/21/2017  . Dry skin 03/21/2017  . Pain in finger of left hand 09/12/2016  . Chronic fatigue 06/12/2016  . Left knee pain 06/12/2016  . Goals of care, counseling/discussion 03/13/2016  . Primary localized osteoarthritis of right knee 02/08/2016  . OSA (obstructive sleep apnea) 09/16/2015  . Rotator cuff syndrome 09/07/2015  . 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 Sleep disturbance 04/14/2015  . Coronary artery disease 03/14/2015  . 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 Acute respiratory failure with hypoxia ([place]) 09/16/2014  . 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 Anxiety   . Tobacco abuse   . Prurigo nodularis   . GERD (gastroesophageal reflux disease)   . COPD (chronic obstructive pulmonary disease) ([place])   . Osteoarthritis   . Vitamin D deficiency disease   . Chronic constipation   . CKD (chronic kidney disease), stage III   . Essential hypertension 05/20/2013  . Morbid obesity ([place]) 05/20/2013    Orientation RESPIRATION BLADDER Height & Weight     Self, Situation  O2( 2L) Continent Weight: 234 lb 2.1 oz (106.2 kg) Height:  5\' 3"  (160 cm)  BEHAVIORAL SYMPTOMS/MOOD NEUROLOGICAL BOWEL NUTRITION STATUS      Continent Diet(heart healthy)  AMBULATORY STATUS COMMUNICATION OF NEEDS Skin   Extensive Assist Verbally Normal                       Personal Care Assistance Level of Assistance  Bathing, Feeding, Dressing Bathing Assistance: Maximum assistance Feeding assistance: Independent Dressing Assistance: Maximum assistance  Functional Limitations Info  Sight, Hearing, Speech Sight Info: Adequate Hearing Info: Adequate Speech Info: Adequate    SPECIAL CARE FACTORS FREQUENCY  PT (By licensed PT), OT (By licensed OT)     PT Frequency: 5x OT Frequency: 5x            Contractures Contractures Info: Not present    Additional Factors Info  Code Status, Allergies, Isolation Precautions Code Status Info:  full Allergies Info: Bee Venom, Enalapril Maleate     Isolation Precautions Info: Air/Con, COVID     Current Medications (01/05/2019):  This is the current hospital active medication list Current Facility-Administered Medications  Medication Dose Route Frequency Provider Last Rate Last Dose  . 0.9 %  sodium chloride infusion  250 mL Intravenous PRN Phillips Grout, MD 10 mL/hr at 01/04/19 0616 250 mL at 01/04/19 0616  . acetaminophen (TYLENOL) tablet 650 mg  650 mg Oral Q6H PRN Phillips Grout, MD      . apixaban (ELIQUIS) tablet 5 mg  5 mg Oral BID Derrill Kay A, MD   5 mg at 01/05/19 1031  . atorvastatin (LIPITOR) tablet 80 mg  80 mg Oral QPM Jonetta Osgood, MD   80 mg at 01/04/19 1757  . chlorpheniramine-HYDROcodone (TUSSIONEX) 10-8 MG/5ML suspension 5 mL  5 mL Oral Q12H PRN Derrill Kay A, MD      . FLUoxetine (PROZAC) capsule 40 mg  40 mg Oral Daily Jonetta Osgood, MD   40 mg at 01/05/19 1031  . gabapentin (NEURONTIN) capsule 300 mg  300 mg Oral QHS Jonetta Osgood, MD   300 mg at 01/04/19 2135  . guaiFENesin-dextromethorphan (ROBITUSSIN DM) 100-10 MG/5ML syrup 10 mL  10 mL Oral Q4H PRN Derrill Kay A, MD      . insulin aspart (novoLOG) injection 0-9 Units  0-9 Units Subcutaneous TID WC Jonetta Osgood, MD   1 Units at 01/04/19 1808  . lacosamide (VIMPAT) tablet 100 mg  100 mg Oral BID Jonetta Osgood, MD   100 mg at 01/05/19 1031  . levETIRAcetam (KEPPRA XR) 24 hr tablet 1,000 mg  1,000 mg Oral Daily Derrill Kay A, MD   1,000 mg at 01/05/19 1030  . linagliptin (TRADJENTA) tablet 5 mg  5 mg Oral Daily Jonetta Osgood, MD   5 mg at 01/05/19 1031  . meropenem (MERREM) 1 g in sodium chloride 0.9 % 100 mL IVPB  1 g Intravenous Q12H Jonetta Osgood, MD 200 mL/hr at 01/05/19 0604 1 g at 01/05/19 0604  . ondansetron (ZOFRAN) tablet 4 mg  4 mg Oral Q6H PRN Phillips Grout, MD       Or  . ondansetron (ZOFRAN) injection 4 mg  4 mg Intravenous Q6H PRN Phillips Grout,  MD      . pantoprazole (PROTONIX) EC tablet 40 mg  40 mg Oral Daily Jonetta Osgood, MD   40 mg at 01/05/19 1031  . sodium chloride flush (NS) 0.9 % injection 3 mL  3 mL Intravenous Q12H Derrill Kay A, MD   3 mL at 01/05/19 1031  . sodium chloride flush (NS) 0.9 % injection 3 mL  3 mL Intravenous PRN Derrill Kay A, MD      . vitamin C (ASCORBIC ACID) tablet 500 mg  500 mg Oral Daily Derrill Kay A, MD   500 mg at 01/05/19 1031  . zinc sulfate capsule 220 mg  220 mg Oral Daily Phillips Grout, MD  220 mg at 01/05/19 1031     Discharge Medications: Please see discharge summary for a list of discharge medications.  Relevant Imaging Results:  Relevant Lab Results:   Additional Information SSN:[id number]  Eileen Stanford, LCSW

## 2019-01-05 NOTE — Progress Notes (Signed)
Call placed to daughter Izora Gala per patient request, she wanted to talk to her and is currently still having a conversation with her at this time. Turned down the heat as pt said she was roasting, cleaned her face, legs, and peri area as she was wet with sweat. Purewick in place and functioning, bed pads under her changed and new pillow cases placed on pillows, will monitor for comfort. No seizure activity noted, vss, pt without resp distress, will continue to monitor throughout the shift.

## 2019-01-05 NOTE — Progress Notes (Signed)
PROGRESS NOTE                                                                                                                                                                                                             Patient Demographics:    Katherine [name], is a 78 y.o. female, DOB - [birth year], UM:[id number]  Outpatient Primary MD for the patient is Valerie Roys, DO   Admit date - 01/03/2019   LOS - 2  No chief complaint on file.      Brief Narrative: Patient is a 78 y.o. female with PMHx of PE on anticoagulation with Eliquis, CKD stage III, DM-2, HTN, COPD, seizures, CVA, chronic debility/deconditioning-virtually bedbound-nursing home resident-recent admission to [facility] from 11/7-11/17 with 19 pneumonitis-treated with steroids and remdesivir-Hospital course then was complicated episodes of unresponsiveness-thought to be secondary to breakthrough seizures-Vimpat was added to Keppra-patient was subsequently discharged to SNF on 11/17.  Patient presented to [facility] ED after a period off unresponsiveness at SNF-while in the ED-she was found to be febrile up to 101 F-she was thought to have sepsis with hypotension secondary to UTI and admitted to the hospitalist service (has history of ESBL E. coli).   Subjective:   Patient in bed, appears comfortable, denies any headache, no fever, no chest pain or pressure, no shortness of breath , no abdominal pain. No focal weakness.   Assessment  & Plan :   Sepsis with hypotension secondary to UTI: Sepsis physiology improving-continue meropenem.  Blood cultures negative so far, urine culture inconclusive.  Patient has prior history of ESBL E. coli.  AKI on CKD stage III: AKI hemodynamically mediated-improving with supportive care.  Recent history of Covid 19 Viral pneumonia: Appropriately treated last admission with steroids and remdesivir.  Chest x-ray on admission negative for pneumonia.   Suspect CRP elevated from UTI and acute issues-rather than active ongoing Covid pneumonitis.  COVID-19 Labs: Recent Labs    01/02/19 2302 01/03/19 0326 01/04/19 0350 01/05/19 0347  DDIMER  --   --  3.27* 3.90*  LDH 136  --   --   --   CRP  --  16.0* 19.7* 14.0*    Lab Results  Component Value Date   SARSCOV2NAA POSITIVE (A) 12/19/2018   SARSCOV2NAA NOT DETECTED 11/24/2018   [place] NEGATIVE 11/21/2018   [place] NEGATIVE 09/06/2018  COVID-19 Medications: Steroids:11/6>>11/16 Remdesivir: 11/6 >>11/10    Anemia: Secondary to chronic disease/CKD-probably slightly worse due to acute illness.  No evidence of blood loss.  Follow.  Seizure disorder: Continue Vimpat/Keppra.  During last admission-episodes of unresponsiveness was thought to be due to breakthrough seizures.  Pulmonary embolism: Continue Eliquis  History of CVA: Continue Eliquis and statin  DM-2: Continue linagliptin-add SSI  GERD: Continue PPI  Weakness/deconditioning/bedbound status: Back to SNF when a bit more stable  Obesity: BMI of 41, follow with PCP.    Consults  :  None  Procedures  :  None   Condition - Stable  Family Communication  :  Spouse updated over the phone by previous MD on 11/21.  Code Status :  Full Code  Diet :  Diet Order            Diet Heart Room service appropriate? Yes; Fluid consistency: Thin  Diet effective now               Disposition Plan  :  Remain hospitalized  Barriers to discharge: IV antibiotics  Antimicorbials  :    Anti-infectives (From admission, onward)   Start     Dose/Rate Route Frequency Ordered Stop   01/05/19 1000  ceFEPIme (MAXIPIME) 2 g in sodium chloride 0.9 % 100 mL IVPB  Status:  Discontinued     2 g 200 mL/hr over 30 Minutes Intravenous Every 12 hours 01/04/19 0009 01/04/19 0507   01/04/19 0600  vancomycin (VANCOCIN) 1,250 mg in sodium chloride 0.9 % 250 mL IVPB  Status:  Discontinued     1,250 mg 166.7 mL/hr over 90  Minutes Intravenous Every 24 hours 01/04/19 0044 01/04/19 0506   01/04/19 0600  meropenem (MERREM) 1 g in sodium chloride 0.9 % 100 mL IVPB     1 g 200 mL/hr over 30 Minutes Intravenous Every 12 hours 01/04/19 0510        DVT Prophylaxis  : Eliquis  Inpatient Medications  Scheduled Meds: . apixaban  5 mg Oral BID  . atorvastatin  80 mg Oral QPM  . FLUoxetine  40 mg Oral Daily  . gabapentin  300 mg Oral QHS  . insulin aspart  0-9 Units Subcutaneous TID WC  . lacosamide  100 mg Oral BID  . levETIRAcetam  1,000 mg Oral Daily  . linagliptin  5 mg Oral Daily  . pantoprazole  40 mg Oral Daily  . sodium chloride flush  3 mL Intravenous Q12H  . vitamin C  500 mg Oral Daily  . zinc sulfate  220 mg Oral Daily   Continuous Infusions: . sodium chloride 250 mL (01/04/19 0616)  . meropenem (MERREM) IV 1 g (01/05/19 0604)   PRN Meds:.sodium chloride, acetaminophen, chlorpheniramine-HYDROcodone, guaiFENesin-dextromethorphan, ondansetron **OR** ondansetron (ZOFRAN) IV, sodium chloride flush   Time Spent in minutes  25  See all Orders from today for further details   Lala Lund M.D on 01/05/2019 at 11:30 AM  To page go to [email or web address] - use universal password  [facility] -  Office  [phone]    Objective:   Vitals:   01/05/19 0400 01/05/19 0500 01/05/19 0600 01/05/19 0700  BP: 117/70 107/69 128/69 126/75  Pulse: 75 68 74 (!) 57  Resp: 20 16 19 15   Temp:      TempSrc:      SpO2: 99% 100% 99% 100%  Weight:      Height:        Wt Readings from Last 3 Encounters:  01/03/19 106.2 kg  01/02/19 103.2 kg  12/27/18 102.5 kg     Intake/Output Summary (Last 24 hours) at 01/05/2019 1130 Last data filed at 01/04/2019 2200 Gross per 24 hour  Intake 120 ml  Output 900 ml  Net -780 ml     Physical Exam  Awake Alert, mild chronic deviation of both eyes to the right side, No new F.N deficits, Normal affect [place].AT,PERRAL Supple Neck,No JVD, No cervical  lymphadenopathy appriciated.  Symmetrical Chest wall movement, Good air movement bilaterally, CTAB RRR,No Gallops, Rubs or new Murmurs, No Parasternal Heave +ve B.Sounds, Abd Soft, No tenderness, No organomegaly appriciated, No rebound - guarding or rigidity. No Cyanosis, Clubbing or edema, No new Rash or bruise    Data Review:    CBC Recent Labs  Lab 12/31/18 0420 01/02/19 2302 01/03/19 1947 01/04/19 0350 01/05/19 0347  WBC 6.4 9.5 8.1 7.6 5.5  HGB 7.5* 7.9* 6.6* 8.5* 7.8*  HCT 23.9* 25.1* 21.0* 27.4* 25.5*  PLT 237 245 189 205 192  MCV 83.6 83.1 82.4 89.0 87.9  MCH 26.2 26.2 25.9* 27.6 26.9  MCHC 31.4 31.5 31.4 31.0 30.6  RDW 19.5* 18.6* 18.7* 17.5* 17.3*  LYMPHSABS 1.6 1.1 0.9 0.9 0.9  MONOABS 0.8 1.1* 0.7 0.8 0.6  EOSABS 0.0 0.0 0.1 0.1 0.1  BASOSABS 0.0 0.0 0.0 0.0 0.0    Chemistries  Recent Labs  Lab 12/30/18 0320 12/31/18 0420 01/02/19 2302 01/03/19 1947 01/04/19 0350 01/05/19 0347  NA 140 138 137 141 141 141  K 5.0 4.6 4.3 4.3 4.5 4.1  CL 107 105 103 114* 113* 114*  CO2 25 25 24  21* 23 22  GLUCOSE 114* 67* 90 106* 113* 98  BUN 23 26* 26* 20 19 17   CREATININE 1.42* 1.30* 1.70* 1.33* 1.32* 1.27*  CALCIUM 8.8* 8.6* 8.6* 7.9* 8.2* 8.7*  MG 2.0 2.0  --   --   --   --   AST 37 39 21  --  21 16  ALT 35 38 26  --  24 21  ALKPHOS 115 99 98  --  90 90  BILITOT 0.2* 0.4 0.8  --  0.7 0.4   ------------------------------------------------------------------------------------------------------------------ No results for input(s): CHOL, HDL, LDLCALC, TRIG, CHOLHDL, LDLDIRECT in the last 72 hours.  Lab Results  Component Value Date   HGBA1C 5.1 12/21/2018   ------------------------------------------------------------------------------------------------------------------ No results for input(s): TSH, T4TOTAL, T3FREE, THYROIDAB in the last 72 hours.  Invalid input(s):  FREET3 ------------------------------------------------------------------------------------------------------------------ No results for input(s): VITAMINB12, FOLATE, FERRITIN, TIBC, IRON, RETICCTPCT in the last 72 hours.  Coagulation profile Recent Labs  Lab 01/02/19 2302  INR 1.6*    Recent Labs    01/04/19 0350 01/05/19 0347  DDIMER 3.27* 3.90*    Cardiac Enzymes No results for input(s): CKMB, TROPONINI, MYOGLOBIN in the last 168 hours.  Invalid input(s): CK ------------------------------------------------------------------------------------------------------------------    Component Value Date/Time   BNP 108.0 (H) 01/02/2019 2303    Micro Results Recent Results (from the past 240 hour(s))  Blood Culture (routine x 2)     Status: None (Preliminary result)   Collection Time: 01/02/19 11:03 PM   Specimen: BLOOD  Result Value Ref Range Status   Specimen Description BLOOD RIGHT FOREARM  Final   Special Requests   Final    BOTTLES DRAWN AEROBIC AND ANAEROBIC Blood Culture results may not be optimal due to an inadequate volume of blood received in culture bottles   Culture   Final    NO GROWTH 3 DAYS Performed  at [facility], [address] [place], [place] [address]    Report Status PENDING  Incomplete  Blood Culture (routine x 2)     Status: None (Preliminary result)   Collection Time: 01/02/19 11:03 PM   Specimen: BLOOD  Result Value Ref Range Status   Specimen Description BLOOD RIGHT ASSIST CONTROL  Final   Special Requests   Final    BOTTLES DRAWN AEROBIC AND ANAEROBIC Blood Culture adequate volume   Culture   Final    NO GROWTH 3 DAYS Performed at [facility], [address], [place], [place] [address]    Report Status PENDING  Incomplete  Urine culture     Status: Abnormal   Collection Time: 01/02/19 11:03 PM   Specimen: In/Out Cath Urine  Result Value Ref Range Status   Specimen Description   Final    IN/OUT CATH URINE Performed  at [facility], [address] [place], [place] [address]    Special Requests   Final    NONE Performed at [facility], [address] [place],  [address]    Culture 80,000 COLONIES/mL YEAST (A)  Final   Report Status 01/04/2019 FINAL  Final    Radiology Reports Dg Chest 2 View  Result Date: 12/19/2018 CLINICAL DATA:  Hemoptysis, cough EXAM: CHEST - 2 VIEW COMPARISON:  11/24/2018 FINDINGS: Increased markings in the lower lungs, likely scarring, similar to prior study. Heart is borderline in size. Aortic atherosclerosis. No acute airspace opacities or effusions. No acute bony abnormality. IMPRESSION: Bibasilar linear densities, likely scarring. No definite acute process. Electronically Signed   By: Rolm Baptise M.D.   On: 12/19/2018 20:59   Ct Head Wo Contrast  Result Date: 12/24/2018 CLINICAL DATA:  Altered level of consciousness EXAM: CT HEAD WITHOUT CONTRAST TECHNIQUE: Contiguous axial images were obtained from the base of the skull through the vertex without intravenous contrast. COMPARISON:  12/19/2018 FINDINGS: Brain: No evidence of acute infarction, hemorrhage, hydrocephalus, extra-axial collection or mass lesion/mass effect. Mild periventricular white matter hypodensity. Vascular: No hyperdense vessel or unexpected calcification. Skull: Normal. Negative for fracture or focal lesion. Sinuses/Orbits: No acute finding. Other: None. IMPRESSION: No acute intracranial pathology.  Small-vessel white matter disease. Electronically Signed   By: Eddie Candle M.D.   On: 12/24/2018 09:04   Ct Head Wo Contrast  Result Date: 12/19/2018 CLINICAL DATA:  Headache, on Eliquis EXAM: CT HEAD WITHOUT CONTRAST TECHNIQUE: Contiguous axial images were obtained from the base of the skull through the vertex without intravenous contrast. COMPARISON:  09/06/2018 FINDINGS: Brain: Patchy chronic small vessel disease throughout the deep white matter. No acute intracranial abnormality.  Specifically, no hemorrhage, hydrocephalus, mass lesion, acute infarction, or significant intracranial injury. Vascular: No hyperdense vessel or unexpected calcification. Skull: No acute calvarial abnormality. Sinuses/Orbits: Visualized paranasal sinuses and mastoids clear. Orbital soft tissues unremarkable. 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 No acute intracranial abnormality. Electronically Signed   By: Rolm Baptise M.D.   On: 12/19/2018 21:00   Ct Angio Chest Pe W And/or Wo Contrast  Result Date: 12/20/2018 CLINICAL DATA:  Nose bleed.  Hemoptysis. EXAM: CT ANGIOGRAPHY CHEST WITH CONTRAST TECHNIQUE: Multidetector CT imaging of the chest was performed using the standard protocol during bolus administration of intravenous contrast. Multiplanar CT image reconstructions and MIPs were obtained to evaluate the vascular anatomy. CONTRAST:  62mL OMNIPAQUE IOHEXOL 350 MG/ML SOLN COMPARISON:  11/21/2018 FINDINGS: Cardiovascular: Contrast injection is sufficient to demonstrate satisfactory  opacification of the pulmonary arteries to the segmental level.Again identified are small subsegmental pulmonary emboli involving the left lower lobe, slightly improved from prior study. The main pulmonary artery is dilated measuring approximately 3.3 cm. Aortic calcifications are noted. The heart size is stable. Coronary artery calcifications are noted. Mediastinum/Nodes: --No mediastinal or hilar lymphadenopathy. --No axillary lymphadenopathy. --No supraclavicular lymphadenopathy. --Normal thyroid gland. --The esophagus is unremarkable Lungs/Pleura: There are worsening bibasilar airspace opacities. There is no pneumothorax. There are trace bilateral pleural effusions, right greater than left. The trachea is unremarkable. Upper Abdomen: No acute abnormality. Musculoskeletal: No chest wall abnormality. No acute or significant osseous findings. Review of the MIP images confirms  the above findings. IMPRESSION: 1. Improving subsegmental pulmonary emboli involving the left lower lobe. 2. Worsening bibasilar airspace opacities which may represent atelectasis or infiltrates. 3. Trace bilateral pleural effusions. 4. Dilated main pulmonary artery which can be seen in patients with elevated pulmonary artery pressures. Aortic Atherosclerosis (ICD10-I70.0). Electronically Signed   By: Constance Holster M.D.   On: 12/20/2018 01:09   Dg Chest Port 1 View  Result Date: 01/05/2019 CLINICAL DATA:  Shortness of breath EXAM: PORTABLE CHEST 1 VIEW COMPARISON:  Three days ago FINDINGS: Cardiomegaly probable vascular congestion. There is streaky density at the lung bases with possible trace right pleural effusion. No pneumothorax. IMPRESSION: Continued linear opacities suggesting atelectasis or scarring. Trace right pleural effusion. Electronically Signed   By: Monte Fantasia M.D.   On: 01/05/2019 09:58   Dg Chest Port 1 View  Result Date: 01/02/2019 CLINICAL DATA:  Shortness of breath and fever EXAM: PORTABLE CHEST 1 VIEW COMPARISON:  12/27/2018 FINDINGS: Artifact overlies the chest. The left chest is clear except for minimal atelectasis or scarring at the left base. Small amount of pleural fluid remains on the right with mild right base atelectasis. No worsening or new finding. IMPRESSION: Small amount of pleural fluid on the right with mild right base atelectasis. No acute finding or significant change since priors. Electronically Signed   By: Nelson Chimes M.D.   On: 01/02/2019 21:57   Cxr Am  Result Date: 12/27/2018 CLINICAL DATA:  Shortness of breath. EXAM: PORTABLE CHEST 1 VIEW COMPARISON:  12/26/2018 FINDINGS: Lordotic technique is demonstrated as the lungs are adequately inflated with subtle linear density right midlung likely atelectasis and unchanged. Mild opacification over the right base unchanged likely small amount of pleural fluid/atelectasis. Minimal linear atelectasis left  base. Mild stable cardiomegaly. Remainder of the exam is unchanged. IMPRESSION: Stable minimal bibasilar opacification likely bibasilar atelectasis with small amount right pleural fluid. Mild stable cardiomegaly. Electronically Signed   By: Marin Olp M.D.   On: 12/27/2018 08:07   Dg Chest Port 1 View  Result Date: 12/26/2018 CLINICAL DATA:  Shortness of breath. EXAM: PORTABLE CHEST 1 VIEW COMPARISON:  CTA chest, 12/20/2018. Chest radiographs, 12/19/2018 and older exams. FINDINGS: Cardiac silhouette is borderline to mildly enlarged. Aorta shows atherosclerotic calcifications and is prominent. No mediastinal or hilar masses. There are linear/reticular lung base opacities, greater on the right, also noted in the right mid lung, likely due to atelectasis. Small pleural effusions are suggested, which were noted on the prior chest CT. Stable prominent bronchovascular markings. No convincing pneumonia or pulmonary edema. No pneumothorax. Skeletal structures are grossly intact. IMPRESSION: 1. No convincing pneumonia or pulmonary edema. 2. Lung base opacities on the right have mildly increased when compared to the prior chest radiographs, most consistent with atelectasis. Small pleural effusions stable compared to the prior chest CTA.  Electronically Signed   By: Lajean Manes M.D.   On: 12/26/2018 09:42

## 2019-01-06 DIAGNOSIS — J449 Chronic obstructive pulmonary disease, unspecified: Secondary | ICD-10-CM

## 2019-01-06 DIAGNOSIS — I5032 Chronic diastolic (congestive) heart failure: Secondary | ICD-10-CM

## 2019-01-06 DIAGNOSIS — D649 Anemia, unspecified: Secondary | ICD-10-CM

## 2019-01-06 LAB — CBC WITH DIFFERENTIAL/PLATELET
Abs Immature Granulocytes: 0.01 10*3/uL (ref 0.00–0.07)
Basophils Absolute: 0 10*3/uL (ref 0.0–0.1)
Basophils Relative: 0 %
Eosinophils Absolute: 0.1 10*3/uL (ref 0.0–0.5)
Eosinophils Relative: 1 %
HCT: 24.6 % — ABNORMAL LOW (ref 36.0–46.0)
Hemoglobin: 7.7 g/dL — ABNORMAL LOW (ref 12.0–15.0)
Immature Granulocytes: 0 %
Lymphocytes Relative: 21 %
Lymphs Abs: 1.2 10*3/uL (ref 0.7–4.0)
MCH: 27.4 pg (ref 26.0–34.0)
MCHC: 31.3 g/dL (ref 30.0–36.0)
MCV: 87.5 fL (ref 80.0–100.0)
Monocytes Absolute: 0.6 10*3/uL (ref 0.1–1.0)
Monocytes Relative: 10 %
Neutro Abs: 4 10*3/uL (ref 1.7–7.7)
Neutrophils Relative %: 68 %
Platelets: 190 10*3/uL (ref 150–400)
RBC: 2.81 MIL/uL — ABNORMAL LOW (ref 3.87–5.11)
RDW: 17.3 % — ABNORMAL HIGH (ref 11.5–15.5)
WBC: 5.8 10*3/uL (ref 4.0–10.5)
nRBC: 0 % (ref 0.0–0.2)

## 2019-01-06 LAB — C-REACTIVE PROTEIN: CRP: 8.1 mg/dL — ABNORMAL HIGH (ref <1.0)

## 2019-01-06 LAB — COMPREHENSIVE METABOLIC PANEL
ALT: 22 U/L (ref 0–44)
AST: 26 U/L (ref 15–41)
Albumin: 1.7 g/dL — ABNORMAL LOW (ref 3.5–5.0)
Alkaline Phosphatase: 93 U/L (ref 38–126)
Anion gap: 6 (ref 5–15)
BUN: 14 mg/dL (ref 8–23)
CO2: 22 mmol/L (ref 22–32)
Calcium: 8.6 mg/dL — ABNORMAL LOW (ref 8.9–10.3)
Chloride: 114 mmol/L — ABNORMAL HIGH (ref 98–111)
Creatinine, Ser: 1.28 mg/dL — ABNORMAL HIGH (ref 0.44–1.00)
GFR calc Af Amer: 47 mL/min — ABNORMAL LOW (ref >60)
GFR calc non Af Amer: 40 mL/min — ABNORMAL LOW (ref >60)
Glucose, Bld: 93 mg/dL (ref 70–99)
Potassium: 3.8 mmol/L (ref 3.5–5.1)
Sodium: 142 mmol/L (ref 135–145)
Total Bilirubin: 0.4 mg/dL (ref 0.3–1.2)
Total Protein: 5.2 g/dL — ABNORMAL LOW (ref 6.5–8.1)

## 2019-01-06 LAB — GLUCOSE, CAPILLARY
Glucose-Capillary: 84 mg/dL (ref 70–99)
Glucose-Capillary: 123 mg/dL — ABNORMAL HIGH (ref 70–99)
Glucose-Capillary: 88 mg/dL (ref 70–99)
Glucose-Capillary: 111 mg/dL — ABNORMAL HIGH (ref 70–99)

## 2019-01-06 LAB — D-DIMER, QUANTITATIVE: D-Dimer, Quant: 3.63 ug/mL-FEU — ABNORMAL HIGH (ref 0.00–0.50)

## 2019-01-06 LAB — PROCALCITONIN: Procalcitonin: <0.1 ng/mL

## 2019-01-06 NOTE — Plan of Care (Signed)
  Problem: Education: Goal: Knowledge of General Education information will improve Description: Including pain rating scale, medication(s)/side effects and non-pharmacologic comfort measures 01/06/2019 0205 by Blase Mess, RN Outcome: Progressing 01/06/2019 0204 by Blase Mess, RN Outcome: Progressing   Problem: Health Behavior/Discharge Planning: Goal: Ability to manage health-related needs will improve 01/06/2019 0205 by Blase Mess, RN Outcome: Progressing 01/06/2019 0204 by Blase Mess, RN Outcome: Progressing   Problem: Clinical Measurements: Goal: Ability to maintain clinical measurements within normal limits will improve 01/06/2019 0205 by Blase Mess, RN Outcome: Progressing 01/06/2019 0204 by Blase Mess, RN Outcome: Progressing Goal: Will remain free from infection 01/06/2019 0205 by Blase Mess, RN Outcome: Progressing 01/06/2019 0204 by Blase Mess, RN Outcome: Progressing Goal: Diagnostic test results will improve 01/06/2019 0205 by Blase Mess, RN Outcome: Progressing 01/06/2019 0204 by Blase Mess, RN Outcome: Progressing Goal: Respiratory complications will improve 01/06/2019 0205 by Blase Mess, RN Outcome: Progressing 01/06/2019 0204 by Blase Mess, RN Outcome: Progressing Goal: Cardiovascular complication will be avoided 01/06/2019 0205 by Blase Mess, RN Outcome: Progressing 01/06/2019 0204 by Blase Mess, RN Outcome: Progressing   Problem: Activity: Goal: Risk for activity intolerance will decrease 01/06/2019 0205 by Blase Mess, RN Outcome: Progressing 01/06/2019 0204 by Blase Mess, RN Outcome: Progressing   Problem: Nutrition: Goal: Adequate nutrition will be maintained 01/06/2019 0205 by Blase Mess, RN Outcome: Progressing 01/06/2019 0204 by Blase Mess, RN Outcome: Progressing   Problem:  Coping: Goal: Level of anxiety will decrease 01/06/2019 0205 by Blase Mess, RN Outcome: Progressing 01/06/2019 0204 by Blase Mess, RN Outcome: Progressing   Problem: Elimination: Goal: Will not experience complications related to bowel motility 01/06/2019 0205 by Blase Mess, RN Outcome: Progressing 01/06/2019 0204 by Blase Mess, RN Outcome: Progressing Goal: Will not experience complications related to urinary retention 01/06/2019 0205 by Blase Mess, RN Outcome: Progressing 01/06/2019 0204 by Blase Mess, RN Outcome: Progressing   Problem: Pain Managment: Goal: General experience of comfort will improve 01/06/2019 0205 by Blase Mess, RN Outcome: Progressing 01/06/2019 0204 by Blase Mess, RN Outcome: Progressing   Problem: Safety: Goal: Ability to remain free from injury will improve 01/06/2019 0205 by Blase Mess, RN Outcome: Progressing 01/06/2019 0204 by Blase Mess, RN Outcome: Progressing   Problem: Skin Integrity: Goal: Risk for impaired skin integrity will decrease 01/06/2019 0205 by Blase Mess, RN Outcome: Progressing 01/06/2019 0204 by Blase Mess, RN Outcome: Progressing

## 2019-01-06 NOTE — Progress Notes (Signed)
PT Cancellation Note  Patient Details Name: Treena Botos MRN: [id number] DOB: 1940/09/19   Cancelled Treatment:    Reason Eval/Treat Not Completed: PT screened, no needs identified, will sign off, Patient from LTC SNF, bed bound, complete care PTA. PT eval'd last admission and deemed skilled  PT not indicated at this Time. Plans will return to SNF   Claretha Cooper 01/06/2019, 7:44 AM  [place]  Office [phone]

## 2019-01-06 NOTE — Progress Notes (Signed)
PROGRESS NOTE                                                                                                                                                                                                             Patient Demographics:    Katherine [name], is a 78 y.o. female, DOB - [birth year], [id number]  Outpatient Primary MD for the patient is Valerie Roys, DO   Admit date - 01/03/2019   LOS - 3  No chief complaint on file.      Brief Narrative: Patient is a 78 y.o. female with PMHx of PE on anticoagulation with Eliquis, CKD stage III, DM-2, HTN, COPD, seizures, CVA, chronic debility/deconditioning-virtually bedbound-nursing home resident-recent admission to [facility] from 11/7-11/17 with 19 pneumonitis-treated with steroids and remdesivir-Hospital course then was complicated episodes of unresponsiveness-thought to be secondary to breakthrough seizures-Vimpat was added to Keppra-patient was subsequently discharged to SNF on 11/17.  Patient presented to [facility] ED after a period off unresponsiveness at SNF-while in the ED-she was found to be febrile up to 101 F-she was thought to have sepsis with hypotension secondary to UTI and admitted to the hospitalist service (has history of ESBL E. coli).   Subjective:   Patient in bed, appears comfortable, denies any headache, no fever, no chest pain or pressure, no shortness of breath , no abdominal pain. No focal weakness.    Assessment  & Plan :   Sepsis with hypotension secondary to UTI: Sepsis physiology improving-continue meropenem.  Blood cultures negative so far, urine culture inconclusive.  Patient has prior history of ESBL E. coli.  Results for GAMILA, WONDERS (MRN [id number]) as of 01/06/2019 11:27  Ref. Range 01/05/2019 03:47 01/06/2019 02:12  Procalcitonin Latest Units: ng/mL <0.10 <0.10    AKI on CKD stage III: AKI hemodynamically mediated-improving with supportive  care.  Recent history of Covid 19 Viral pneumonia: Appropriately treated last admission with steroids and remdesivir.  Chest x-ray on admission negative for pneumonia.  Suspect CRP elevated from UTI and acute issues-rather than active ongoing Covid pneumonitis.  COVID-19 Labs: Recent Labs    01/04/19 0350 01/05/19 0347 01/06/19 0212  DDIMER 3.27* 3.90* 3.63*  CRP 19.7* 14.0* 8.1*    Lab Results  Component Value Date   SARSCOV2NAA POSITIVE (A) 12/19/2018   SARSCOV2NAA NOT DETECTED 11/24/2018   [place] NEGATIVE 11/21/2018  [place] NEGATIVE 09/06/2018     COVID-19 Medications: Steroids:11/6>>11/16 Remdesivir: 11/6 >>11/10   Anemia: Secondary to chronic disease/CKD-probably slightly worse due to acute illness.  No evidence of blood loss.  Follow.  Seizure disorder: Continue Vimpat/Keppra.  During last admission-episodes of unresponsiveness was thought to be due to breakthrough seizures.  Pulmonary embolism: Continue Eliquis  History of CVA: Continue Eliquis and statin  DM-2: Continue linagliptin-add SSI  GERD: Continue PPI  Weakness/deconditioning/bedbound status: Back to SNF when a bit more stable  Obesity: BMI of 41, follow with PCP.    Consults  :  None  Procedures  :  None   Condition - Stable  Family Communication  :  Spouse updated over the phone by previous MD on 11/21. By me 01/06/19  Code Status :  Full Code  Diet :  Diet Order            Diet Heart Room service appropriate? Yes; Fluid consistency: Thin  Diet effective now               Disposition Plan  :  Remain hospitalized  Barriers to discharge: IV antibiotics  Antimicorbials  :    Anti-infectives (From admission, onward)   Start     Dose/Rate Route Frequency Ordered Stop   01/05/19 1000  ceFEPIme (MAXIPIME) 2 g in sodium chloride 0.9 % 100 mL IVPB  Status:  Discontinued     2 g 200 mL/hr over 30 Minutes Intravenous Every 12 hours 01/04/19 0009 01/04/19 0507   01/04/19 0600   vancomycin (VANCOCIN) 1,250 mg in sodium chloride 0.9 % 250 mL IVPB  Status:  Discontinued     1,250 mg 166.7 mL/hr over 90 Minutes Intravenous Every 24 hours 01/04/19 0044 01/04/19 0506   01/04/19 0600  meropenem (MERREM) 1 g in sodium chloride 0.9 % 100 mL IVPB     1 g 200 mL/hr over 30 Minutes Intravenous Every 12 hours 01/04/19 0510        DVT Prophylaxis  : Eliquis  Inpatient Medications  Scheduled Meds: . apixaban  5 mg Oral BID  . atorvastatin  80 mg Oral QPM  . FLUoxetine  40 mg Oral Daily  . gabapentin  300 mg Oral QHS  . insulin aspart  0-9 Units Subcutaneous TID WC  . lacosamide  100 mg Oral BID  . levETIRAcetam  1,000 mg Oral Daily  . linagliptin  5 mg Oral Daily  . pantoprazole  40 mg Oral Daily  . sodium chloride flush  3 mL Intravenous Q12H  . vitamin C  500 mg Oral Daily  . zinc sulfate  220 mg Oral Daily   Continuous Infusions: . sodium chloride Stopped (01/06/19 0544)  . meropenem (MERREM) IV 200 mL/hr at 01/06/19 0600   PRN Meds:.sodium chloride, acetaminophen, chlorpheniramine-HYDROcodone, guaiFENesin-dextromethorphan, ondansetron **OR** ondansetron (ZOFRAN) IV, sodium chloride flush   Time Spent in minutes  25  See all Orders from today for further details   Lala Lund M.D on 01/06/2019 at 11:27 AM  To page go to [email or web address] - use universal password  [facility] -  Office  [phone]    Objective:   Vitals:   01/06/19 0500 01/06/19 0600 01/06/19 0700 01/06/19 0800  BP: 123/67 113/63 120/74 127/70  Pulse: 78 79 76 80  Resp: (!) 22 (!) 23 20 19   Temp:    98.4 F (36.9 C)  TempSrc:    Tympanic  SpO2: 99% 98% 100% 99%  Weight:      Height:  Wt Readings from Last 3 Encounters:  01/03/19 106.2 kg  01/02/19 103.2 kg  12/27/18 102.5 kg     Intake/Output Summary (Last 24 hours) at 01/06/2019 1127 Last data filed at 01/06/2019 0600 Gross per 24 hour  Intake 955.05 ml  Output 1100 ml  Net -144.95 ml      Physical Exam  Awake Alert, mild chronic deviation of both eyes to the right side, No new F.N deficits, Normal affect [place].AT,PERRAL Supple Neck,No JVD, No cervical lymphadenopathy appriciated.  Symmetrical Chest wall movement, Good air movement bilaterally, CTAB RRR,No Gallops, Rubs or new Murmurs, No Parasternal Heave +ve B.Sounds, Abd Soft, No tenderness, No organomegaly appriciated, No rebound - guarding or rigidity. No Cyanosis, Clubbing or edema, No new Rash or bruise   Data Review:    CBC Recent Labs  Lab 01/02/19 2302 01/03/19 1947 01/04/19 0350 01/05/19 0347 01/06/19 0212  WBC 9.5 8.1 7.6 5.5 5.8  HGB 7.9* 6.6* 8.5* 7.8* 7.7*  HCT 25.1* 21.0* 27.4* 25.5* 24.6*  PLT 245 189 205 192 190  MCV 83.1 82.4 89.0 87.9 87.5  MCH 26.2 25.9* 27.6 26.9 27.4  MCHC 31.5 31.4 31.0 30.6 31.3  RDW 18.6* 18.7* 17.5* 17.3* 17.3*  LYMPHSABS 1.1 0.9 0.9 0.9 1.2  MONOABS 1.1* 0.7 0.8 0.6 0.6  EOSABS 0.0 0.1 0.1 0.1 0.1  BASOSABS 0.0 0.0 0.0 0.0 0.0    Chemistries  Recent Labs  Lab 12/31/18 0420 01/02/19 2302 01/03/19 1947 01/04/19 0350 01/05/19 0347 01/06/19 0212  NA 138 137 141 141 141 142  K 4.6 4.3 4.3 4.5 4.1 3.8  CL 105 103 114* 113* 114* 114*  CO2 25 24 21* 23 22 22   GLUCOSE 67* 90 106* 113* 98 93  BUN 26* 26* 20 19 17 14   CREATININE 1.30* 1.70* 1.33* 1.32* 1.27* 1.28*  CALCIUM 8.6* 8.6* 7.9* 8.2* 8.7* 8.6*  MG 2.0  --   --   --   --   --   AST 39 21  --  21 16 26   ALT 38 26  --  24 21 22   ALKPHOS 99 98  --  90 90 93  BILITOT 0.4 0.8  --  0.7 0.4 0.4   ------------------------------------------------------------------------------------------------------------------ No results for input(s): CHOL, HDL, LDLCALC, TRIG, CHOLHDL, LDLDIRECT in the last 72 hours.  Lab Results  Component Value Date   HGBA1C 5.1 12/21/2018   ------------------------------------------------------------------------------------------------------------------ No results for input(s):  TSH, T4TOTAL, T3FREE, THYROIDAB in the last 72 hours.  Invalid input(s): FREET3 ------------------------------------------------------------------------------------------------------------------ No results for input(s): VITAMINB12, FOLATE, FERRITIN, TIBC, IRON, RETICCTPCT in the last 72 hours.  Coagulation profile Recent Labs  Lab 01/02/19 2302  INR 1.6*    Recent Labs    01/05/19 0347 01/06/19 0212  DDIMER 3.90* 3.63*    Cardiac Enzymes No results for input(s): CKMB, TROPONINI, MYOGLOBIN in the last 168 hours.  Invalid input(s): CK ------------------------------------------------------------------------------------------------------------------    Component Value Date/Time   BNP 108.0 (H) 01/02/2019 2303    Micro Results Recent Results (from the past 240 hour(s))  Blood Culture (routine x 2)     Status: None (Preliminary result)   Collection Time: 01/02/19 11:03 PM   Specimen: BLOOD  Result Value Ref Range Status   Specimen Description BLOOD RIGHT FOREARM  Final   Special Requests   Final    BOTTLES DRAWN AEROBIC AND ANAEROBIC Blood Culture results may not be optimal due to an inadequate volume of blood received in culture bottles   Culture   Final  NO GROWTH 4 DAYS Performed at [facility], [address] [place], [place] [address]    Report Status PENDING  Incomplete  Blood Culture (routine x 2)     Status: None (Preliminary result)   Collection Time: 01/02/19 11:03 PM   Specimen: BLOOD  Result Value Ref Range Status   Specimen Description BLOOD RIGHT ASSIST CONTROL  Final   Special Requests   Final    BOTTLES DRAWN AEROBIC AND ANAEROBIC Blood Culture adequate volume   Culture   Final    NO GROWTH 4 DAYS Performed at [facility], [address] [place], [place] [address]    Report Status PENDING  Incomplete  Urine culture     Status: Abnormal   Collection Time: 01/02/19 11:03 PM   Specimen: In/Out Cath Urine  Result Value Ref  Range Status   Specimen Description   Final    IN/OUT CATH URINE Performed at [facility], [address] [place], [place] [address]    Special Requests   Final    NONE Performed at [facility], [address], [place],  [address]    Culture 80,000 COLONIES/mL YEAST (A)  Final   Report Status 01/04/2019 FINAL  Final    Radiology Reports Dg Chest 2 View  Result Date: 12/19/2018 CLINICAL DATA:  Hemoptysis, cough EXAM: CHEST - 2 VIEW COMPARISON:  11/24/2018 FINDINGS: Increased markings in the lower lungs, likely scarring, similar to prior study. Heart is borderline in size. Aortic atherosclerosis. No acute airspace opacities or effusions. No acute bony abnormality. IMPRESSION: Bibasilar linear densities, likely scarring. No definite acute process. Electronically Signed   By: Rolm Baptise M.D.   On: 12/19/2018 20:59   Ct Head Wo Contrast  Result Date: 12/24/2018 CLINICAL DATA:  Altered level of consciousness EXAM: CT HEAD WITHOUT CONTRAST TECHNIQUE: Contiguous axial images were obtained from the base of the skull through the vertex without intravenous contrast. COMPARISON:  12/19/2018 FINDINGS: Brain: No evidence of acute infarction, hemorrhage, hydrocephalus, extra-axial collection or mass lesion/mass effect. Mild periventricular white matter hypodensity. Vascular: No hyperdense vessel or unexpected calcification. Skull: Normal. Negative for fracture or focal lesion. Sinuses/Orbits: No acute finding. Other: None. IMPRESSION: No acute intracranial pathology.  Small-vessel white matter disease. Electronically Signed   By: Eddie Candle M.D.   On: 12/24/2018 09:04   Ct Head Wo Contrast  Result Date: 12/19/2018 CLINICAL DATA:  Headache, on Eliquis EXAM: CT HEAD WITHOUT CONTRAST TECHNIQUE: Contiguous axial images were obtained from the base of the skull through the vertex without intravenous contrast. COMPARISON:  09/06/2018 FINDINGS: Brain: Patchy chronic small vessel  disease throughout the deep white matter. No acute intracranial abnormality. Specifically, no hemorrhage, hydrocephalus, mass lesion, acute infarction, or significant intracranial injury. Vascular: No hyperdense vessel or unexpected calcification. Skull: No acute calvarial abnormality. Sinuses/Orbits: Visualized paranasal sinuses and mastoids clear. Orbital soft tissues unremarkable. 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 No acute intracranial abnormality. Electronically Signed   By: Rolm Baptise M.D.   On: 12/19/2018 21:00   Ct Angio Chest Pe W And/or Wo Contrast  Result Date: 12/20/2018 CLINICAL DATA:  Nose bleed.  Hemoptysis. EXAM: CT ANGIOGRAPHY CHEST WITH CONTRAST TECHNIQUE: Multidetector CT imaging of the chest was performed using the standard protocol during bolus administration of intravenous contrast. Multiplanar CT image reconstructions and MIPs were obtained to evaluate the vascular anatomy. CONTRAST:  45mL OMNIPAQUE IOHEXOL 350 MG/ML SOLN COMPARISON:  11/21/2018 FINDINGS: Cardiovascular: Contrast injection  is sufficient to demonstrate satisfactory opacification of the pulmonary arteries to the segmental level.Again identified are small subsegmental pulmonary emboli involving the left lower lobe, slightly improved from prior study. The main pulmonary artery is dilated measuring approximately 3.3 cm. Aortic calcifications are noted. The heart size is stable. Coronary artery calcifications are noted. Mediastinum/Nodes: --No mediastinal or hilar lymphadenopathy. --No axillary lymphadenopathy. --No supraclavicular lymphadenopathy. --Normal thyroid gland. --The esophagus is unremarkable Lungs/Pleura: There are worsening bibasilar airspace opacities. There is no pneumothorax. There are trace bilateral pleural effusions, right greater than left. The trachea is unremarkable. Upper Abdomen: No acute abnormality. Musculoskeletal: No chest wall abnormality.  No acute or significant osseous findings. Review of the MIP images confirms the above findings. IMPRESSION: 1. Improving subsegmental pulmonary emboli involving the left lower lobe. 2. Worsening bibasilar airspace opacities which may represent atelectasis or infiltrates. 3. Trace bilateral pleural effusions. 4. Dilated main pulmonary artery which can be seen in patients with elevated pulmonary artery pressures. Aortic Atherosclerosis (ICD10-I70.0). Electronically Signed   By: Constance Holster M.D.   On: 12/20/2018 01:09   Dg Chest Port 1 View  Result Date: 01/05/2019 CLINICAL DATA:  Shortness of breath EXAM: PORTABLE CHEST 1 VIEW COMPARISON:  Three days ago FINDINGS: Cardiomegaly probable vascular congestion. There is streaky density at the lung bases with possible trace right pleural effusion. No pneumothorax. IMPRESSION: Continued linear opacities suggesting atelectasis or scarring. Trace right pleural effusion. Electronically Signed   By: Monte Fantasia M.D.   On: 01/05/2019 09:58   Dg Chest Port 1 View  Result Date: 01/02/2019 CLINICAL DATA:  Shortness of breath and fever EXAM: PORTABLE CHEST 1 VIEW COMPARISON:  12/27/2018 FINDINGS: Artifact overlies the chest. The left chest is clear except for minimal atelectasis or scarring at the left base. Small amount of pleural fluid remains on the right with mild right base atelectasis. No worsening or new finding. IMPRESSION: Small amount of pleural fluid on the right with mild right base atelectasis. No acute finding or significant change since priors. Electronically Signed   By: Nelson Chimes M.D.   On: 01/02/2019 21:57   Cxr Am  Result Date: 12/27/2018 CLINICAL DATA:  Shortness of breath. EXAM: PORTABLE CHEST 1 VIEW COMPARISON:  12/26/2018 FINDINGS: Lordotic technique is demonstrated as the lungs are adequately inflated with subtle linear density right midlung likely atelectasis and unchanged. Mild opacification over the right base unchanged likely  small amount of pleural fluid/atelectasis. Minimal linear atelectasis left base. Mild stable cardiomegaly. Remainder of the exam is unchanged. IMPRESSION: Stable minimal bibasilar opacification likely bibasilar atelectasis with small amount right pleural fluid. Mild stable cardiomegaly. Electronically Signed   By: Marin Olp M.D.   On: 12/27/2018 08:07   Dg Chest Port 1 View  Result Date: 12/26/2018 CLINICAL DATA:  Shortness of breath. EXAM: PORTABLE CHEST 1 VIEW COMPARISON:  CTA chest, 12/20/2018. Chest radiographs, 12/19/2018 and older exams. FINDINGS: Cardiac silhouette is borderline to mildly enlarged. Aorta shows atherosclerotic calcifications and is prominent. No mediastinal or hilar masses. There are linear/reticular lung base opacities, greater on the right, also noted in the right mid lung, likely due to atelectasis. Small pleural effusions are suggested, which were noted on the prior chest CT. Stable prominent bronchovascular markings. No convincing pneumonia or pulmonary edema. No pneumothorax. Skeletal structures are grossly intact. IMPRESSION: 1. No convincing pneumonia or pulmonary edema. 2. Lung base opacities on the right have mildly increased when compared to the prior chest radiographs, most consistent with atelectasis. Small pleural effusions stable compared  to the prior chest CTA. Electronically Signed   By: Lajean Manes M.D.   On: 12/26/2018 09:42

## 2019-01-06 NOTE — Consult Note (Signed)
   [facility] CM Inpatient Consult   01/06/2019  Katherine [name] [birth year] [id number]   Patient screened for extreme high risk score for unplanned readmission score  And for less than 7 day readmission hospitalizations.    Review of patient's medical record reveals patient is per MD notes today:  Patient is a 78 y.o. female with PMHx of PE on anticoagulation with Eliquis, CKD stage III, DM-2, HTN, COPD, seizures, CVA, chronic debility/deconditioning-virtually bedbound-nursing home resident-recent admission to [facility] from 11/7-11/17.   Plan: Sign off as this patient is a resident in a nursing facility.   Please place a [facility] Care Management consult as appropriate and for questions contact:   Natividad Brood, RN BSN [place] [facility] Liaison  [phone] business mobile phone Toll free office [phone]  Fax number: [phone] [name]

## 2019-01-06 NOTE — TOC Initial Note (Signed)
Transition of Care [facility]) - Initial/Assessment Note    Patient Details  Name: Katherine [name] MRN: [id number] Date of Birth: 1940/07/29  Transition of Care [facility]) CM/SW Contact:    Eileen Stanford, LCSW Phone Number: 01/06/2019, 2:04 PM  Clinical Narrative:   Pt is only alert to self. CSW spoke with pt's spouse via telephone. Pt's spouse states after pt is medically stable she will return to [facility]. CSW to send referral.                Expected Discharge Plan: Skilled Nursing Facility Barriers to Discharge: Continued Medical Work up   Patient Goals and CMS Choice Patient states their goals for this hospitalization and ongoing recovery are:: "to go back to [facility]"- per spouse   Choice offered to / list presented to : Spouse  Expected Discharge Plan and Services Expected Discharge Plan: [place] In-house Referral: NA Discharge Planning Services: NA Post Acute Care Choice: [place] Living arrangements for the past 2 months: [place]: NA          Prior Living Arrangements/Services Living arrangements for the past 2 months: [place] Lives with:: Self Patient language and need for interpreter reviewed:: Yes Do you feel safe going back to the place where you live?: Yes      Need for Family Participation in Patient Care: Yes (Comment) Care giver support system in place?: Yes (comment)   Criminal Activity/Legal Involvement Pertinent to Current Situation/Hospitalization: No - Comment as needed  Activities of Daily Living Home Assistive Devices/Equipment: None ADL Screening (condition at time of admission) Patient's cognitive ability adequate to safely complete daily activities?: No Is the patient deaf or have difficulty hearing?: Yes Does the patient have difficulty seeing, even when wearing glasses/contacts?: No Does the patient have difficulty  concentrating, remembering, or making decisions?: Yes Patient able to express need for assistance with ADLs?: Yes Does the patient have difficulty dressing or bathing?: Yes Independently performs ADLs?: No Communication: Independent Dressing (OT): Needs assistance Is this a change from baseline?: Pre-admission baseline Grooming: Needs assistance Is this a change from baseline?: Pre-admission baseline Feeding: Needs assistance Is this a change from baseline?: Pre-admission baseline Bathing: Needs assistance Is this a change from baseline?: Pre-admission baseline Toileting: Needs assistance Is this a change from baseline?: Pre-admission baseline In/Out Bed: Needs assistance Is this a change from baseline?: Pre-admission baseline Walks in Home: Needs assistance Is this a change from baseline?: Pre-admission baseline Does the patient have difficulty walking or climbing stairs?: Yes Weakness of Legs: Both Weakness of Arms/Hands: None  Permission Sought/Granted Permission sought to share information with : Family Supports    Share Information with NAME: [name]  Permission granted to share info w AGENCY: [place] granted to share info w Relationship: Spouse     Emotional Assessment Appearance:: Appears stated age Attitude/Demeanor/Rapport: Engaged Affect (typically observed): Accepting, Appropriate, Calm Orientation: : Oriented to Self Alcohol / Substance Use: Not Applicable Psych Involvement: No (comment)  Admission diagnosis:  COVID-19 VIRUS INFECTION RESPIRATORY DISTRESS Patient Active Problem List   Diagnosis Date Noted  . Acute on chronic anemia 01/03/2019  . COVID-19 12/21/2018  . COVID-19 virus infection 12/21/2018  . Hemoptysis 12/20/2018  . Pulmonary embolism ([place]) 11/21/2018  . Delirium   . UTI (urinary tract  infection) 08/21/2018  . Chest pain 07/15/2018  . Palliative care encounter 05/10/2018  . Localized edema 05/10/2018  . Shortness  of breath 05/10/2018  . Hematemesis 04/28/2018  . Influenza A 04/13/2018  . Acute on chronic congestive heart failure ([place])   . COPD with acute exacerbation ([place]) 02/24/2018  . Chronic diastolic heart failure ([place]) 12/31/2017  . Lymphedema 12/31/2017  . COPD exacerbation ([place]) 11/30/2017  . Urinary tract infection 11/22/2017  . 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[place]) 11/16/2017  . Diabetes mellitus type 2, uncomplicated ([place]) 0000000  . Pressure injury of skin 10/29/2017  . Acute kidney injury superimposed on CKD ([place]) 10/24/2017  . Sepsis ([place]) 10/24/2017  . Possible Seizures ([place]) 10/08/2017  . Hyperlipemia 10/08/2017  . 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 Stroke-like episode ([place]) s/p IV tpa 10/04/2017  . Palliative care by specialist   . Elevated rheumatoid factor 09/05/2017  . Frequent hospital admissions 08/22/2017  . Aphasia 06/28/2017  . Hand pain 03/21/2017  . Dry skin 03/21/2017  . Pain in finger of left hand 09/12/2016  . Chronic fatigue 06/12/2016  . Left knee pain 06/12/2016  . Goals of care, counseling/discussion 03/13/2016  . Primary localized osteoarthritis of right knee 02/08/2016  . OSA (obstructive sleep apnea) 09/16/2015  . Rotator cuff syndrome 09/07/2015  . 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 Sleep disturbance 04/14/2015  . Coronary artery disease 03/14/2015  . 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 Acute respiratory failure with hypoxia ([place]) 09/16/2014  . 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 Anxiety   . Tobacco abuse   . Prurigo nodularis   . GERD (gastroesophageal reflux disease)   . COPD (chronic obstructive pulmonary disease) ([place])   . Osteoarthritis   . Vitamin D deficiency disease   . Chronic constipation   . CKD (chronic kidney disease), stage III   . Essential hypertension 05/20/2013  . Morbid obesity ([place]) 05/20/2013   PCP:  Valerie Roys, DO Pharmacy:   [facility] [address] [place]. [place] [address] Phone: [phone] Fax: [phone]     Social Determinants of Health (SDOH) Interventions    Readmission Risk Interventions Readmission Risk Prevention Plan 11/26/2018 11/22/2018 07/16/2018  Transportation Screening Complete Complete Complete  Medication Review Press photographer) Complete Complete Complete  PCP or Specialist appointment within 3-5 days of discharge (No Data) - Complete  PCP/Specialist Appt Not Complete comments - - -  [place]RI or [place] (No Data) - Complete  SW Recovery Care/Counseling Consult Complete - Not Complete  SW Consult Not Complete Comments - - na  Palliative Care Screening - - Not Applicable  Medication Reconcilation ([place] Complete Complete Complete  Some recent data might be hidden

## 2019-01-07 LAB — COMPREHENSIVE METABOLIC PANEL
ALT: 22 U/L (ref 0–44)
AST: 25 U/L (ref 15–41)
Albumin: 1.8 g/dL — ABNORMAL LOW (ref 3.5–5.0)
Alkaline Phosphatase: 97 U/L (ref 38–126)
Anion gap: 6 (ref 5–15)
BUN: 13 mg/dL (ref 8–23)
CO2: 24 mmol/L (ref 22–32)
Calcium: 8.9 mg/dL (ref 8.9–10.3)
Chloride: 110 mmol/L (ref 98–111)
Creatinine, Ser: 1.2 mg/dL — ABNORMAL HIGH (ref 0.44–1.00)
GFR calc Af Amer: 50 mL/min — ABNORMAL LOW (ref >60)
GFR calc non Af Amer: 44 mL/min — ABNORMAL LOW (ref >60)
Glucose, Bld: 84 mg/dL (ref 70–99)
Potassium: 3.9 mmol/L (ref 3.5–5.1)
Sodium: 140 mmol/L (ref 135–145)
Total Bilirubin: 0.4 mg/dL (ref 0.3–1.2)
Total Protein: 5.5 g/dL — ABNORMAL LOW (ref 6.5–8.1)

## 2019-01-07 LAB — CBC WITH DIFFERENTIAL/PLATELET
Abs Immature Granulocytes: 0.05 10*3/uL (ref 0.00–0.07)
Basophils Absolute: 0 10*3/uL (ref 0.0–0.1)
Basophils Relative: 0 %
Eosinophils Absolute: 0.1 10*3/uL (ref 0.0–0.5)
Eosinophils Relative: 2 %
HCT: 25.3 % — ABNORMAL LOW (ref 36.0–46.0)
Hemoglobin: 7.9 g/dL — ABNORMAL LOW (ref 12.0–15.0)
Immature Granulocytes: 1 %
Lymphocytes Relative: 25 %
Lymphs Abs: 1.3 10*3/uL (ref 0.7–4.0)
MCH: 27.2 pg (ref 26.0–34.0)
MCHC: 31.2 g/dL (ref 30.0–36.0)
MCV: 87.2 fL (ref 80.0–100.0)
Monocytes Absolute: 0.5 10*3/uL (ref 0.1–1.0)
Monocytes Relative: 9 %
Neutro Abs: 3.3 10*3/uL (ref 1.7–7.7)
Neutrophils Relative %: 63 %
Platelets: 199 10*3/uL (ref 150–400)
RBC: 2.9 MIL/uL — ABNORMAL LOW (ref 3.87–5.11)
RDW: 17.3 % — ABNORMAL HIGH (ref 11.5–15.5)
WBC: 5.3 10*3/uL (ref 4.0–10.5)
nRBC: 0 % (ref 0.0–0.2)

## 2019-01-07 LAB — D-DIMER, QUANTITATIVE: D-Dimer, Quant: 2.85 ug/mL-FEU — ABNORMAL HIGH (ref 0.00–0.50)

## 2019-01-07 LAB — PROCALCITONIN: Procalcitonin: <0.1 ng/mL

## 2019-01-07 LAB — GLUCOSE, CAPILLARY
Glucose-Capillary: 81 mg/dL (ref 70–99)
Glucose-Capillary: 106 mg/dL — ABNORMAL HIGH (ref 70–99)
Glucose-Capillary: 92 mg/dL (ref 70–99)
Glucose-Capillary: 95 mg/dL (ref 70–99)

## 2019-01-07 LAB — C-REACTIVE PROTEIN: CRP: 4.5 mg/dL — ABNORMAL HIGH (ref <1.0)

## 2019-01-07 NOTE — Progress Notes (Signed)
PROGRESS NOTE                                                                                                                                                                                                             Patient Demographics:    Katherine [name], is a 78 y.o. female, DOB - 08/21/1940, [id number]  Outpatient Primary MD for the patient is Valerie Roys, DO   Admit date - 01/03/2019   LOS - 4  No chief complaint on file.      Brief Narrative: Patient is a 78 y.o. female with PMHx of PE on anticoagulation with Eliquis, CKD stage III, DM-2, HTN, COPD, seizures, CVA, chronic debility/deconditioning-virtually bedbound-nursing home resident-recent admission to [facility] from 11/7-11/17 with 19 pneumonitis-treated with steroids and remdesivir-Hospital course then was complicated episodes of unresponsiveness-thought to be secondary to breakthrough seizures-Vimpat was added to Keppra-patient was subsequently discharged to SNF on 11/17.  Patient presented to [facility] ED after a period off unresponsiveness at SNF-while in the ED-she was found to be febrile up to 101 F-she was thought to have sepsis with hypotension secondary to UTI and admitted to the hospitalist service (has history of ESBL E. coli).   Subjective:   Patient in bed, appears comfortable, denies any headache, no fever, no chest pain or pressure, no shortness of breath , no abdominal pain. No focal weakness.   Assessment  & Plan :   Sepsis with hypotension secondary to UTI: Sepsis physiology improving-continue meropenem.  Blood cultures negative so far, urine culture inconclusive.  Patient has prior history of ESBL E. coli.  Will treat for total of 7 days with a stop date of 01/10/2019.  Thereafter discharged back to SNF.  Results for Katherine [name], Katherine [name] (MRN [id number]) as of 01/06/2019 11:27  Ref. Range 01/05/2019 03:47 01/06/2019 02:12  Procalcitonin Latest Units:  ng/mL <0.10 <0.10    AKI on CKD stage III: AKI hemodynamically mediated-improving with supportive care.  Recent history of Covid 19 Viral pneumonia: Appropriately treated last admission with steroids and remdesivir.  Chest x-ray on admission negative for pneumonia.  Suspect CRP elevated from UTI and acute issues-rather than active ongoing Covid pneumonitis.  COVID-19 Labs: Recent Labs    01/05/19 0347 01/06/19 0212 01/07/19 0325  DDIMER 3.90* 3.63* 2.85*  CRP 14.0* 8.1* 4.5*    Lab Results  Component Value  Date   SARSCOV2NAA POSITIVE (A) 12/19/2018   SARSCOV2NAA NOT DETECTED 11/24/2018   [place] NEGATIVE 11/21/2018   [place] NEGATIVE 09/06/2018     COVID-19 Medications: Steroids:11/6>>11/16 Remdesivir: 11/6 >>11/10   Anemia: Secondary to chronic disease/CKD-probably slightly worse due to acute illness.  No evidence of blood loss.  Follow.  Seizure disorder: Continue Vimpat/Keppra.  During last admission-episodes of unresponsiveness was thought to be due to breakthrough seizures.  Pulmonary embolism: Continue Eliquis  History of CVA: Continue Eliquis and statin  DM-2: Continue linagliptin-add SSI  GERD: Continue PPI  Weakness/deconditioning/bedbound status: Back to SNF when a bit more stable  Obesity: BMI of 41, follow with PCP.    Consults  :  None  Procedures  :  None   Condition - Stable  Family Communication  :  Spouse updated over the phone by previous MD on 11/21. By me 01/06/19  Code Status :  Full Code  Diet :  Diet Order            Diet Heart Room service appropriate? Yes; Fluid consistency: Thin  Diet effective now               Disposition Plan  : Finish IV antibiotics on 01/10/2019 then discharged back to SNF.  Barriers to discharge: IV antibiotics  Antimicorbials  :    Anti-infectives (From admission, onward)   Start     Dose/Rate Route Frequency Ordered Stop   01/05/19 1000  ceFEPIme (MAXIPIME) 2 g in sodium chloride 0.9  % 100 mL IVPB  Status:  Discontinued     2 g 200 mL/hr over 30 Minutes Intravenous Every 12 hours 01/04/19 0009 01/04/19 0507   01/04/19 0600  vancomycin (VANCOCIN) 1,250 mg in sodium chloride 0.9 % 250 mL IVPB  Status:  Discontinued     1,250 mg 166.7 mL/hr over 90 Minutes Intravenous Every 24 hours 01/04/19 0044 01/04/19 0506   01/04/19 0600  meropenem (MERREM) 1 g in sodium chloride 0.9 % 100 mL IVPB     1 g 200 mL/hr over 30 Minutes Intravenous Every 12 hours 01/04/19 0510        DVT Prophylaxis  : Eliquis  Inpatient Medications  Scheduled Meds: . apixaban  5 mg Oral BID  . atorvastatin  80 mg Oral QPM  . FLUoxetine  40 mg Oral Daily  . gabapentin  300 mg Oral QHS  . insulin aspart  0-9 Units Subcutaneous TID WC  . lacosamide  100 mg Oral BID  . levETIRAcetam  1,000 mg Oral Daily  . linagliptin  5 mg Oral Daily  . pantoprazole  40 mg Oral Daily  . sodium chloride flush  3 mL Intravenous Q12H  . vitamin C  500 mg Oral Daily  . zinc sulfate  220 mg Oral Daily   Continuous Infusions: . sodium chloride Stopped (01/06/19 0544)  . meropenem (MERREM) IV 1 g (01/07/19 0537)   PRN Meds:.sodium chloride, acetaminophen, chlorpheniramine-HYDROcodone, guaiFENesin-dextromethorphan, ondansetron **OR** ondansetron (ZOFRAN) IV, sodium chloride flush   Time Spent in minutes  25  See all Orders from today for further details   Lala Lund M.D on 01/07/2019 at 11:20 AM  To page go to [email or web address] - use universal password  [facility] -  Office  [phone]    Objective:   Vitals:   01/06/19 1700 01/06/19 1942 01/07/19 0403 01/07/19 0815  BP: 130/74 114/67 (!) 136/98 114/65  Pulse: 77 80 71   Resp: (!) 22 (!) 23 (!) 21  Temp: 98.5 F (36.9 C) 99.1 F (37.3 C) 98.8 F (37.1 C)   TempSrc: Oral Oral Oral   SpO2: 100% 98% 98%   Weight:      Height:        Wt Readings from Last 3 Encounters:  01/03/19 106.2 kg  01/02/19 103.2 kg  12/27/18 102.5 kg      Intake/Output Summary (Last 24 hours) at 01/07/2019 1120 Last data filed at 01/07/2019 0600 Gross per 24 hour  Intake 925.3 ml  Output 1150 ml  Net -224.7 ml     Physical Exam  Awake Alert, mild chronic deviation of both eyes to the right side, No new F.N deficits, Normal affect [place].AT,PERRAL Supple Neck,No JVD, No cervical lymphadenopathy appriciated.  Symmetrical Chest wall movement, Good air movement bilaterally, CTAB RRR,No Gallops, Rubs or new Murmurs, No Parasternal Heave +ve B.Sounds, Abd Soft, No tenderness, No organomegaly appriciated, No rebound - guarding or rigidity. No Cyanosis, Clubbing or edema, No new Rash or bruise    Data Review:    CBC Recent Labs  Lab 01/03/19 1947 01/04/19 0350 01/05/19 0347 01/06/19 0212 01/07/19 0325  WBC 8.1 7.6 5.5 5.8 5.3  HGB 6.6* 8.5* 7.8* 7.7* 7.9*  HCT 21.0* 27.4* 25.5* 24.6* 25.3*  PLT 189 205 192 190 199  MCV 82.4 89.0 87.9 87.5 87.2  MCH 25.9* 27.6 26.9 27.4 27.2  MCHC 31.4 31.0 30.6 31.3 31.2  RDW 18.7* 17.5* 17.3* 17.3* 17.3*  LYMPHSABS 0.9 0.9 0.9 1.2 1.3  MONOABS 0.7 0.8 0.6 0.6 0.5  EOSABS 0.1 0.1 0.1 0.1 0.1  BASOSABS 0.0 0.0 0.0 0.0 0.0    Chemistries  Recent Labs  Lab 01/02/19 2302 01/03/19 1947 01/04/19 0350 01/05/19 0347 01/06/19 0212 01/07/19 0325  NA 137 141 141 141 142 140  K 4.3 4.3 4.5 4.1 3.8 3.9  CL 103 114* 113* 114* 114* 110  CO2 24 21* 23 22 22 24   GLUCOSE 90 106* 113* 98 93 84  BUN 26* 20 19 17 14 13   CREATININE 1.70* 1.33* 1.32* 1.27* 1.28* 1.20*  CALCIUM 8.6* 7.9* 8.2* 8.7* 8.6* 8.9  AST 21  --  21 16 26 25   ALT 26  --  24 21 22 22   ALKPHOS 98  --  90 90 93 97  BILITOT 0.8  --  0.7 0.4 0.4 0.4   ------------------------------------------------------------------------------------------------------------------ No results for input(s): CHOL, HDL, LDLCALC, TRIG, CHOLHDL, LDLDIRECT in the last 72 hours.  Lab Results  Component Value Date   HGBA1C 5.1 12/21/2018    ------------------------------------------------------------------------------------------------------------------ No results for input(s): TSH, T4TOTAL, T3FREE, THYROIDAB in the last 72 hours.  Invalid input(s): FREET3 ------------------------------------------------------------------------------------------------------------------ No results for input(s): VITAMINB12, FOLATE, FERRITIN, TIBC, IRON, RETICCTPCT in the last 72 hours.  Coagulation profile Recent Labs  Lab 01/02/19 2302  INR 1.6*    Recent Labs    01/06/19 0212 01/07/19 0325  DDIMER 3.63* 2.85*    Cardiac Enzymes No results for input(s): CKMB, TROPONINI, MYOGLOBIN in the last 168 hours.  Invalid input(s): CK ------------------------------------------------------------------------------------------------------------------    Component Value Date/Time   BNP 108.0 (H) 01/02/2019 2303    Micro Results Recent Results (from the past 240 hour(s))  Blood Culture (routine x 2)     Status: None (Preliminary result)   Collection Time: 01/02/19 11:03 PM   Specimen: BLOOD  Result Value Ref Range Status   Specimen Description BLOOD RIGHT FOREARM  Final   Special Requests   Final    BOTTLES DRAWN AEROBIC AND ANAEROBIC  Blood Culture results may not be optimal due to an inadequate volume of blood received in culture bottles   Culture   Final    NO GROWTH 4 DAYS Performed at [facility], [address] [place], [place] [address]    Report Status PENDING  Incomplete  Blood Culture (routine x 2)     Status: None (Preliminary result)   Collection Time: 01/02/19 11:03 PM   Specimen: BLOOD  Result Value Ref Range Status   Specimen Description BLOOD RIGHT ASSIST CONTROL  Final   Special Requests   Final    BOTTLES DRAWN AEROBIC AND ANAEROBIC Blood Culture adequate volume   Culture   Final    NO GROWTH 4 DAYS Performed at [facility], [address], [place], [place] [address]    Report Status  PENDING  Incomplete  Urine culture     Status: Abnormal   Collection Time: 01/02/19 11:03 PM   Specimen: In/Out Cath Urine  Result Value Ref Range Status   Specimen Description   Final    IN/OUT CATH URINE Performed at [facility], [address] [place], [place] [address]    Special Requests   Final    NONE Performed at [facility], [address] [place], [place] [address]    Culture 80,000 COLONIES/mL YEAST (A)  Final   Report Status 01/04/2019 FINAL  Final    Radiology Reports Dg Chest 2 View  Result Date: 12/19/2018 CLINICAL DATA:  Hemoptysis, cough EXAM: CHEST - 2 VIEW COMPARISON:  11/24/2018 FINDINGS: Increased markings in the lower lungs, likely scarring, similar to prior study. Heart is borderline in size. Aortic atherosclerosis. No acute airspace opacities or effusions. No acute bony abnormality. IMPRESSION: Bibasilar linear densities, likely scarring. No definite acute process. Electronically Signed   By: Rolm Baptise M.D.   On: 12/19/2018 20:59   Ct Head Wo Contrast  Result Date: 12/24/2018 CLINICAL DATA:  Altered level of consciousness EXAM: CT HEAD WITHOUT CONTRAST TECHNIQUE: Contiguous axial images were obtained from the base of the skull through the vertex without intravenous contrast. COMPARISON:  12/19/2018 FINDINGS: Brain: No evidence of acute infarction, hemorrhage, hydrocephalus, extra-axial collection or mass lesion/mass effect. Mild periventricular white matter hypodensity. Vascular: No hyperdense vessel or unexpected calcification. Skull: Normal. Negative for fracture or focal lesion. Sinuses/Orbits: No acute finding. Other: None. IMPRESSION: No acute intracranial pathology.  Small-vessel white matter disease. Electronically Signed   By: Eddie Candle M.D.   On: 12/24/2018 09:04   Ct Head Wo Contrast  Result Date: 12/19/2018 CLINICAL DATA:  Headache, on Eliquis EXAM: CT HEAD WITHOUT CONTRAST TECHNIQUE: Contiguous axial images were obtained  from the base of the skull through the vertex without intravenous contrast. COMPARISON:  09/06/2018 FINDINGS: Brain: Patchy chronic small vessel disease throughout the deep white matter. No acute intracranial abnormality. Specifically, no hemorrhage, hydrocephalus, mass lesion, acute infarction, or significant intracranial injury. Vascular: No hyperdense vessel or unexpected calcification. Skull: No acute calvarial abnormality. Sinuses/Orbits: Visualized paranasal sinuses and mastoids clear. Orbital soft tissues unremarkable. 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 No acute intracranial abnormality. Electronically Signed   By: Rolm Baptise M.D.   On: 12/19/2018 21:00   Ct Angio Chest Pe W And/or Wo Contrast  Result Date: 12/20/2018 CLINICAL DATA:  Nose bleed.  Hemoptysis. EXAM: CT ANGIOGRAPHY CHEST WITH CONTRAST TECHNIQUE: Multidetector CT imaging of the chest was performed using the standard protocol during bolus administration of intravenous contrast. Multiplanar  CT image reconstructions and MIPs were obtained to evaluate the vascular anatomy. CONTRAST:  37mL OMNIPAQUE IOHEXOL 350 MG/ML SOLN COMPARISON:  11/21/2018 FINDINGS: Cardiovascular: Contrast injection is sufficient to demonstrate satisfactory opacification of the pulmonary arteries to the segmental level.Again identified are small subsegmental pulmonary emboli involving the left lower lobe, slightly improved from prior study. The main pulmonary artery is dilated measuring approximately 3.3 cm. Aortic calcifications are noted. The heart size is stable. Coronary artery calcifications are noted. Mediastinum/Nodes: --No mediastinal or hilar lymphadenopathy. --No axillary lymphadenopathy. --No supraclavicular lymphadenopathy. --Normal thyroid gland. --The esophagus is unremarkable Lungs/Pleura: There are worsening bibasilar airspace opacities. There is no pneumothorax. There are trace bilateral pleural  effusions, right greater than left. The trachea is unremarkable. Upper Abdomen: No acute abnormality. Musculoskeletal: No chest wall abnormality. No acute or significant osseous findings. Review of the MIP images confirms the above findings. IMPRESSION: 1. Improving subsegmental pulmonary emboli involving the left lower lobe. 2. Worsening bibasilar airspace opacities which may represent atelectasis or infiltrates. 3. Trace bilateral pleural effusions. 4. Dilated main pulmonary artery which can be seen in patients with elevated pulmonary artery pressures. Aortic Atherosclerosis (ICD10-I70.0). Electronically Signed   By: Constance Holster M.D.   On: 12/20/2018 01:09   Dg Chest Port 1 View  Result Date: 01/05/2019 CLINICAL DATA:  Shortness of breath EXAM: PORTABLE CHEST 1 VIEW COMPARISON:  Three days ago FINDINGS: Cardiomegaly probable vascular congestion. There is streaky density at the lung bases with possible trace right pleural effusion. No pneumothorax. IMPRESSION: Continued linear opacities suggesting atelectasis or scarring. Trace right pleural effusion. Electronically Signed   By: Monte Fantasia M.D.   On: 01/05/2019 09:58   Dg Chest Port 1 View  Result Date: 01/02/2019 CLINICAL DATA:  Shortness of breath and fever EXAM: PORTABLE CHEST 1 VIEW COMPARISON:  12/27/2018 FINDINGS: Artifact overlies the chest. The left chest is clear except for minimal atelectasis or scarring at the left base. Small amount of pleural fluid remains on the right with mild right base atelectasis. No worsening or new finding. IMPRESSION: Small amount of pleural fluid on the right with mild right base atelectasis. No acute finding or significant change since priors. Electronically Signed   By: Nelson Chimes M.D.   On: 01/02/2019 21:57   Cxr Am  Result Date: 12/27/2018 CLINICAL DATA:  Shortness of breath. EXAM: PORTABLE CHEST 1 VIEW COMPARISON:  12/26/2018 FINDINGS: Lordotic technique is demonstrated as the lungs are  adequately inflated with subtle linear density right midlung likely atelectasis and unchanged. Mild opacification over the right base unchanged likely small amount of pleural fluid/atelectasis. Minimal linear atelectasis left base. Mild stable cardiomegaly. Remainder of the exam is unchanged. IMPRESSION: Stable minimal bibasilar opacification likely bibasilar atelectasis with small amount right pleural fluid. Mild stable cardiomegaly. Electronically Signed   By: Marin Olp M.D.   On: 12/27/2018 08:07   Dg Chest Port 1 View  Result Date: 12/26/2018 CLINICAL DATA:  Shortness of breath. EXAM: PORTABLE CHEST 1 VIEW COMPARISON:  CTA chest, 12/20/2018. Chest radiographs, 12/19/2018 and older exams. FINDINGS: Cardiac silhouette is borderline to mildly enlarged. Aorta shows atherosclerotic calcifications and is prominent. No mediastinal or hilar masses. There are linear/reticular lung base opacities, greater on the right, also noted in the right mid lung, likely due to atelectasis. Small pleural effusions are suggested, which were noted on the prior chest CT. Stable prominent bronchovascular markings. No convincing pneumonia or pulmonary edema. No pneumothorax. Skeletal structures are grossly intact. IMPRESSION: 1. No convincing pneumonia or pulmonary  edema. 2. Lung base opacities on the right have mildly increased when compared to the prior chest radiographs, most consistent with atelectasis. Small pleural effusions stable compared to the prior chest CTA. Electronically Signed   By: Lajean Manes M.D.   On: 12/26/2018 09:42

## 2019-01-07 NOTE — Consult Note (Signed)
Pharmacy Antibiotic Note  Katherine [name] is a 78 y.o. female re-admitted to [place] 01/03/2019 with sepsis.  She was discharged from [facility] on 12-31-18 to [facility], where she was found unresponsive in bed tonight.  While at [facility], she completed a course of Remdesivir and Unasyn for aspiration pneumonia.   Pharmacy has been consulted to dose meropenem for possible ESBL infection.    Plan: Continue Meropenem 1 gram iv Q 12 hours Continue to follow   Height: 5\' 3"  (160 cm) Weight: 234 lb 2.1 oz (106.2 kg) IBW/kg (Calculated) : 52.4  Temp (24hrs), Avg:98.8 F (37.1 C), Min:98.5 F (36.9 C), Max:99.1 F (37.3 C)  Recent Labs  Lab 01/02/19 2302 01/03/19 1947 01/04/19 0350 01/05/19 0347 01/06/19 0212 01/07/19 0325  WBC 9.5 8.1 7.6 5.5 5.8 5.3  CREATININE 1.70* 1.33* 1.32* 1.27* 1.28* 1.20*  LATICACIDVEN 1.1 0.6 1.4  --   --   --     Estimated Creatinine Clearance: 45.8 mL/min (A) (by C-G formula based on SCr of 1.2 mg/dL (H)).    Allergies  Allergen Reactions  . Bee Venom Swelling  . Enalapril Maleate Swelling     Thank you for allowing pharmacy to participate in this patient's care. Anette Guarneri, PharmD  01/07/2019 9:43 AM

## 2019-01-08 DIAGNOSIS — N1832 Chronic kidney disease, stage 3b: Secondary | ICD-10-CM

## 2019-01-08 DIAGNOSIS — E118 Type 2 diabetes mellitus with unspecified complications: Secondary | ICD-10-CM | POA: Diagnosis present

## 2019-01-08 DIAGNOSIS — N179 Acute kidney failure, unspecified: Secondary | ICD-10-CM

## 2019-01-08 DIAGNOSIS — A419 Sepsis, unspecified organism: Secondary | ICD-10-CM | POA: Diagnosis present

## 2019-01-08 DIAGNOSIS — I2601 Septic pulmonary embolism with acute cor pulmonale: Secondary | ICD-10-CM

## 2019-01-08 DIAGNOSIS — I10 Essential (primary) hypertension: Secondary | ICD-10-CM

## 2019-01-08 DIAGNOSIS — G40909 Epilepsy, unspecified, not intractable, without status epilepticus: Secondary | ICD-10-CM

## 2019-01-08 DIAGNOSIS — N390 Urinary tract infection, site not specified: Secondary | ICD-10-CM

## 2019-01-08 HISTORY — DX: Urinary tract infection, site not specified: A41.9

## 2019-01-08 HISTORY — DX: Acute kidney failure, unspecified: N17.9

## 2019-01-08 LAB — CBC WITH DIFFERENTIAL/PLATELET
Abs Immature Granulocytes: 0.02 10*3/uL (ref 0.00–0.07)
Basophils Absolute: 0 10*3/uL (ref 0.0–0.1)
Basophils Relative: 0 %
Eosinophils Absolute: 0.2 10*3/uL (ref 0.0–0.5)
Eosinophils Relative: 2 %
HCT: 25.7 % — ABNORMAL LOW (ref 36.0–46.0)
Hemoglobin: 8 g/dL — ABNORMAL LOW (ref 12.0–15.0)
Immature Granulocytes: 0 %
Lymphocytes Relative: 21 %
Lymphs Abs: 1.4 10*3/uL (ref 0.7–4.0)
MCH: 27.3 pg (ref 26.0–34.0)
MCHC: 31.1 g/dL (ref 30.0–36.0)
MCV: 87.7 fL (ref 80.0–100.0)
Monocytes Absolute: 0.7 10*3/uL (ref 0.1–1.0)
Monocytes Relative: 10 %
Neutro Abs: 4.5 10*3/uL (ref 1.7–7.7)
Neutrophils Relative %: 67 %
Platelets: 220 10*3/uL (ref 150–400)
RBC: 2.93 MIL/uL — ABNORMAL LOW (ref 3.87–5.11)
RDW: 17.4 % — ABNORMAL HIGH (ref 11.5–15.5)
WBC: 6.8 10*3/uL (ref 4.0–10.5)
nRBC: 0 % (ref 0.0–0.2)

## 2019-01-08 LAB — COMPREHENSIVE METABOLIC PANEL
ALT: 21 U/L (ref 0–44)
AST: 28 U/L (ref 15–41)
Albumin: 1.9 g/dL — ABNORMAL LOW (ref 3.5–5.0)
Alkaline Phosphatase: 98 U/L (ref 38–126)
Anion gap: 7 (ref 5–15)
BUN: 14 mg/dL (ref 8–23)
CO2: 22 mmol/L (ref 22–32)
Calcium: 8.9 mg/dL (ref 8.9–10.3)
Chloride: 112 mmol/L — ABNORMAL HIGH (ref 98–111)
Creatinine, Ser: 1.25 mg/dL — ABNORMAL HIGH (ref 0.44–1.00)
GFR calc Af Amer: 48 mL/min — ABNORMAL LOW (ref >60)
GFR calc non Af Amer: 41 mL/min — ABNORMAL LOW (ref >60)
Glucose, Bld: 72 mg/dL (ref 70–99)
Potassium: 4.2 mmol/L (ref 3.5–5.1)
Sodium: 141 mmol/L (ref 135–145)
Total Bilirubin: 0.4 mg/dL (ref 0.3–1.2)
Total Protein: 5.5 g/dL — ABNORMAL LOW (ref 6.5–8.1)

## 2019-01-08 LAB — GLUCOSE, CAPILLARY
Glucose-Capillary: 78 mg/dL (ref 70–99)
Glucose-Capillary: 120 mg/dL — ABNORMAL HIGH (ref 70–99)
Glucose-Capillary: 132 mg/dL — ABNORMAL HIGH (ref 70–99)
Glucose-Capillary: 131 mg/dL — ABNORMAL HIGH (ref 70–99)

## 2019-01-08 LAB — C-REACTIVE PROTEIN: CRP: 4.3 mg/dL — ABNORMAL HIGH (ref <1.0)

## 2019-01-08 LAB — D-DIMER, QUANTITATIVE: D-Dimer, Quant: 2.72 ug/mL-FEU — ABNORMAL HIGH (ref 0.00–0.50)

## 2019-01-08 NOTE — Progress Notes (Signed)
PROGRESS NOTE    Katherine [name]  [id number] DOB: Jun 04, 1940 DOA: 01/03/2019 PCP: Valerie Roys, DO   Brief Narrative:  78 y.o. BF PMHx PE on anticoagulation with Eliquis, CKD stage III, DM-2, HTN, COPD, seizures, CVA, chronic debility/deconditioning-virtually bedbound-nursing home resident-recent admission to [facility] from 11/7-11/17 with 19 pneumonitis-treated with steroids and remdesivir-Hospital course then was complicated episodes of unresponsiveness-thought to be secondary to breakthrough seizures-Vimpat was added to Keppra-patient was subsequently discharged to SNF on 11/17.    Patient presented to [facility] ED after a period off unresponsiveness at SNF-while in the ED-she was found to be febrile up to 101 F-she was thought to have sepsis with hypotension secondary to UTI and admitted to the hospitalist service (has history of ESBL E. coli).   Subjective: Last 24 hours afebrile, states hurts everywhere.  Negative N/V, negative CP, negative S OB.   Assessment & Plan:   Principal Problem:   UTI (urinary tract infection) Active Problems:   Essential hypertension   COPD (chronic obstructive pulmonary disease) (HCC)   Acute kidney injury superimposed on CKD (HCC)   Sepsis (HCC)   Chronic diastolic heart failure (HCC)   Pulmonary embolism (HCC)   COVID-19 virus infection   Acute on chronic anemia   Sepsis secondary to UTI ([place])   Acute renal failure superimposed on stage 3b chronic kidney disease (HCC)   Seizure disorder (HCC)   Diabetes mellitus type 2, controlled, with complications ([place])   Sepsis with hypotension secondary to UTI:  -Sepsis physiology improving-continue meropenem.   -Blood cultures negative so far, urine culture inconclusive.  Patient has prior history of ESBL E. coli.  Will treat for total of 7 days with a stop date of 01/10/2019.  Thereafter discharged back to SNF.  Results for Katherine [name] (MRN [id number]) as of 01/06/2019 11:27  Ref. Range 01/05/2019  03:47 01/06/2019 02:12  Procalcitonin Latest Units: ng/mL <0.10 <0.10    Acute on CKD stage III (baseline  Cr 1.5-1.6) Recent Labs  Lab 01/04/19 0350 01/05/19 0347 01/06/19 0212 01/07/19 0325 01/08/19 0030  CREATININE 1.32* 1.27* 1.28* 1.20* 1.25*  -Better than baseline monitor closely  Recent history of Covid 19 Viral pneumonia:  -Appropriately treated last admission with steroids and remdesivir.   -Chest x-ray on admission negative for pneumonia.   -Suspect CRP elevated from UTI and acute issues-rather than active ongoing Covid pneumonitis. COVID-19 Labs  Recent Labs    01/06/19 0212 01/07/19 0325 01/08/19 0030  DDIMER 3.63* 2.85* 2.72*  CRP 8.1* 4.5* 4.3*    Lab Results  Component Value Date   SARSCOV2NAA POSITIVE (A) 12/19/2018   SARSCOV2NAA NOT DETECTED 11/24/2018   [place] NEGATIVE 11/21/2018   [place] NEGATIVE 09/06/2018   COVID-19 Medications: Steroids:11/6>>11/16 Remdesivir: 11/6 >>11/10  Anemia: -Secondary to chronic disease/CKD-probably slightly worse due to acute illness.  No evidence of blood loss.  Follow.  Seizure disorder:  -Continue Vimpa 100 mg  BID -Keppra. 1000 mg daily -During last admission-episodes of unresponsiveness was thought to be due to breakthrough seizures.  Pulmonary embolism:  -Continue Eliquis 5 mg BID  History of CVA:  -Continue Eliquis and statin  Chronic diastolic CHF -Strict in and out -Daily weight  Diabetes type 2 controlled with complication -[id number] hemoglobin A1c= 5.1 -Continue linagliptin -Sensitive SSI  GERD:  -Continue PPI  Weakness/deconditioning/bedbound status:  -Back to SNF when a bit more stable  Obesity:  -BMI of 41, follow with PCP.    DVT prophylaxis: Eliquis Code Status: Full Family Communication:  Disposition Plan: TBD  Consultants:    Procedures/Significant Events:     I have personally reviewed and interpreted all radiology studies and my findings are as  above.  VENTILATOR SETTINGS:    Cultures   Antimicrobials: Anti-infectives (From admission, onward)   Start     Stop   01/05/19 1000  ceFEPIme (MAXIPIME) 2 g in sodium chloride 0.9 % 100 mL IVPB  Status:  Discontinued     01/04/19 0507   01/04/19 0600  vancomycin (VANCOCIN) 1,250 mg in sodium chloride 0.9 % 250 mL IVPB  Status:  Discontinued     01/04/19 0506   01/04/19 0600  meropenem (MERREM) 1 g in sodium chloride 0.9 % 100 mL IVPB             Devices    LINES / TUBES:      Continuous Infusions: . sodium chloride Stopped (01/06/19 0544)  . meropenem (MERREM) IV 1 g (01/08/19 1015)     Objective: Vitals:   01/07/19 2014 01/08/19 0634 01/08/19 0800 01/08/19 1453  BP:  (!) 142/76 128/80 128/80  Pulse: 90 87 79 86  Resp: 19 19 20 15   Temp: 98.8 F (37.1 C) 98.8 F (37.1 C) 98.8 F (37.1 C) 99.8 F (37.7 C)  TempSrc: Oral Oral Oral Axillary  SpO2: 99% 96% 100% 99%  Weight:      Height:        Intake/Output Summary (Last 24 hours) at 01/08/2019 1535 Last data filed at 01/07/2019 2008 Gross per 24 hour  Intake 480 ml  Output 750 ml  Net -270 ml   Filed Weights   01/03/19 2353  Weight: 106.2 kg    Examination:  General: A/O x4, positive acute respiratory distress Eyes: negative scleral hemorrhage, negative anisocoria, negative icterus ENT: Negative Runny nose, negative gingival bleeding, Neck:  Negative scars, masses, torticollis, lymphadenopathy, JVD Lungs: Clear to auscultation bilaterally without wheezes or crackles Cardiovascular: Regular rate and rhythm without murmur gallop or rub normal S1 and S2 Abdomen: Morbidly obese, negative abdominal pain, nondistended, positive soft, bowel sounds, no rebound, no ascites, no appreciable mass Extremities: No significant cyanosis, clubbing, or edema bilateral lower extremities Skin: Negative rashes, lesions, ulcers Psychiatric:  Negative depression, negative anxiety, negative fatigue, negative mania   Central nervous system:  Cranial nerves II through XII intact, tongue/uvula midline, all extremities muscle strength 5/5, sensation intact throughout, negative dysarthria, negative expressive aphasia, negative receptive aphasia.  .     Data Reviewed: Care during the described time interval was provided by me .  I have reviewed this patient's available data, including medical history, events of note, physical examination, and all test results as part of my evaluation.   CBC: Recent Labs  Lab 01/04/19 0350 01/05/19 0347 01/06/19 0212 01/07/19 0325 01/08/19 0030  WBC 7.6 5.5 5.8 5.3 6.8  NEUTROABS 5.8 3.9 4.0 3.3 4.5  HGB 8.5* 7.8* 7.7* 7.9* 8.0*  HCT 27.4* 25.5* 24.6* 25.3* 25.7*  MCV 89.0 87.9 87.5 87.2 87.7  PLT 205 192 190 199 [id number]   Basic Metabolic Panel: Recent Labs  Lab 01/04/19 0350 01/05/19 0347 01/06/19 0212 01/07/19 0325 01/08/19 0030  NA 141 141 142 140 141  K 4.5 4.1 3.8 3.9 4.2  CL 113* 114* 114* 110 112*  CO2 23 22 22 24 22   GLUCOSE 113* 98 93 84 72  BUN 19 17 14 13 14   CREATININE 1.32* 1.27* 1.28* 1.20* 1.25*  CALCIUM 8.2* 8.7* 8.6* 8.9 8.9   GFR: Estimated Creatinine Clearance: 44 mL/min (A) (  by C-G formula based on SCr of 1.25 mg/dL (H)). Liver Function Tests: Recent Labs  Lab 01/04/19 0350 01/05/19 0347 01/06/19 0212 01/07/19 0325 01/08/19 0030  AST 21 16 26 25 28   ALT 24 21 22 22 21   ALKPHOS 90 90 93 97 98  BILITOT 0.7 0.4 0.4 0.4 0.4  PROT 5.7* 5.6* 5.2* 5.5* 5.5*  ALBUMIN 2.0* 1.9* 1.7* 1.8* 1.9*   No results for input(s): LIPASE, AMYLASE in the last 168 hours. No results for input(s): AMMONIA in the last 168 hours. Coagulation Profile: Recent Labs  Lab 01/02/19 2302  INR 1.6*   Cardiac Enzymes: No results for input(s): CKTOTAL, CKMB, CKMBINDEX, TROPONINI in the last 168 hours. BNP (last 3 results) No results for input(s): PROBNP in the last 8760 hours. HbA1C: No results for input(s): HGBA1C in the last 72 hours. CBG: Recent  Labs  Lab 01/07/19 1138 01/07/19 1611 01/07/19 2045 01/08/19 0715 01/08/19 1108  GLUCAP 106* 92 95 78 120*   Lipid Profile: No results for input(s): CHOL, HDL, LDLCALC, TRIG, CHOLHDL, LDLDIRECT in the last 72 hours. Thyroid Function Tests: No results for input(s): TSH, T4TOTAL, FREET4, T3FREE, THYROIDAB in the last 72 hours. Anemia Panel: No results for input(s): VITAMINB12, FOLATE, FERRITIN, TIBC, IRON, RETICCTPCT in the last 72 hours. Urine analysis:    Component Value Date/Time   COLORURINE YELLOW (A) 01/02/2019 2303   APPEARANCEUR CLOUDY (A) 01/02/2019 2303   APPEARANCEUR Clear 02/20/2013 1459   LABSPEC 1.012 01/02/2019 2303   LABSPEC 1.012 02/20/2013 1459   PHURINE 7.0 01/02/2019 2303   GLUCOSEU NEGATIVE 01/02/2019 2303   GLUCOSEU Negative 02/20/2013 1459   HGBUR NEGATIVE 01/02/2019 2303   BILIRUBINUR NEGATIVE 01/02/2019 2303   BILIRUBINUR Negative 02/20/2013 [place] 01/02/2019 2303   PROTEINUR 30 (A) 01/02/2019 2303   NITRITE NEGATIVE 01/02/2019 2303   LEUKOCYTESUR LARGE (A) 01/02/2019 2303   LEUKOCYTESUR Negative 02/20/2013 1459   Sepsis Labs: @[email or web address] ([id number])  ) Recent Results (from the past 240 hour(s))  Blood Culture (routine x 2)     Status: None (Preliminary result)   Collection Time: 01/02/19 11:03 PM   Specimen: BLOOD  Result Value Ref Range Status   Specimen Description BLOOD RIGHT FOREARM  Final   Special Requests   Final    BOTTLES DRAWN AEROBIC AND ANAEROBIC Blood Culture results may not be optimal due to an inadequate volume of blood received in culture bottles   Culture   Final    NO GROWTH 4 DAYS Performed at [facility], [address] [place], [place] [address]    Report Status PENDING  Incomplete  Blood Culture (routine x 2)     Status: None (Preliminary result)   Collection Time: 01/02/19 11:03 PM   Specimen: BLOOD  Result Value Ref Range Status   Specimen Description BLOOD RIGHT  ASSIST CONTROL  Final   Special Requests   Final    BOTTLES DRAWN AEROBIC AND ANAEROBIC Blood Culture adequate volume   Culture   Final    NO GROWTH 4 DAYS Performed at [facility], [address], [place], [place] [address]    Report Status PENDING  Incomplete  Urine culture     Status: Abnormal   Collection Time: 01/02/19 11:03 PM   Specimen: In/Out Cath Urine  Result Value Ref Range Status   Specimen Description   Final    IN/OUT CATH URINE Performed at [facility], [address], [place], [place] [address]    Special  Requests   Final    NONE Performed at [facility], [address] [place], [place] [address]    Culture 80,000 COLONIES/mL YEAST (A)  Final   Report Status 01/04/2019 FINAL  Final         Radiology Studies: No results found.      Scheduled Meds: . apixaban  5 mg Oral BID  . atorvastatin  80 mg Oral QPM  . FLUoxetine  40 mg Oral Daily  . gabapentin  300 mg Oral QHS  . insulin aspart  0-9 Units Subcutaneous TID WC  . lacosamide  100 mg Oral BID  . levETIRAcetam  1,000 mg Oral Daily  . linagliptin  5 mg Oral Daily  . pantoprazole  40 mg Oral Daily  . sodium chloride flush  3 mL Intravenous Q12H  . vitamin C  500 mg Oral Daily  . zinc sulfate  220 mg Oral Daily   Continuous Infusions: . sodium chloride Stopped (01/06/19 0544)  . meropenem (MERREM) IV 1 g (01/08/19 1015)     LOS: 5 days   The patient is critically ill with multiple organ systems failure and requires high complexity decision making for assessment and support, frequent evaluation and titration of therapies, application of advanced monitoring technologies and extensive interpretation of multiple databases. Critical Care Time devoted to patient care services described in this note  Time spent: 40 minutes     Elam Ellis, Geraldo Docker, MD [facility] Pager [phone]  If 7PM-7AM, please contact night-coverage [email or web address] Password  [facility] 01/08/2019, 3:35 PM

## 2019-01-09 LAB — CBC WITH DIFFERENTIAL/PLATELET
Abs Immature Granulocytes: 0.07 10*3/uL (ref 0.00–0.07)
Basophils Absolute: 0 10*3/uL (ref 0.0–0.1)
Basophils Relative: 0 %
Eosinophils Absolute: 0.1 10*3/uL (ref 0.0–0.5)
Eosinophils Relative: 2 %
HCT: 27.7 % — ABNORMAL LOW (ref 36.0–46.0)
Hemoglobin: 8.6 g/dL — ABNORMAL LOW (ref 12.0–15.0)
Immature Granulocytes: 1 %
Lymphocytes Relative: 23 %
Lymphs Abs: 1.3 10*3/uL (ref 0.7–4.0)
MCH: 27.1 pg (ref 26.0–34.0)
MCHC: 31 g/dL (ref 30.0–36.0)
MCV: 87.4 fL (ref 80.0–100.0)
Monocytes Absolute: 0.4 10*3/uL (ref 0.1–1.0)
Monocytes Relative: 8 %
Neutro Abs: 3.7 10*3/uL (ref 1.7–7.7)
Neutrophils Relative %: 66 %
Platelets: 229 10*3/uL (ref 150–400)
RBC: 3.17 MIL/uL — ABNORMAL LOW (ref 3.87–5.11)
RDW: 17.2 % — ABNORMAL HIGH (ref 11.5–15.5)
WBC: 5.7 10*3/uL (ref 4.0–10.5)
nRBC: 0 % (ref 0.0–0.2)

## 2019-01-09 LAB — COMPREHENSIVE METABOLIC PANEL
ALT: 22 U/L (ref 0–44)
AST: 29 U/L (ref 15–41)
Albumin: 2 g/dL — ABNORMAL LOW (ref 3.5–5.0)
Alkaline Phosphatase: 107 U/L (ref 38–126)
Anion gap: 7 (ref 5–15)
BUN: 14 mg/dL (ref 8–23)
CO2: 26 mmol/L (ref 22–32)
Calcium: 9.4 mg/dL (ref 8.9–10.3)
Chloride: 108 mmol/L (ref 98–111)
Creatinine, Ser: 1.12 mg/dL — ABNORMAL HIGH (ref 0.44–1.00)
GFR calc Af Amer: 55 mL/min — ABNORMAL LOW (ref >60)
GFR calc non Af Amer: 47 mL/min — ABNORMAL LOW (ref >60)
Glucose, Bld: 92 mg/dL (ref 70–99)
Potassium: 4.1 mmol/L (ref 3.5–5.1)
Sodium: 141 mmol/L (ref 135–145)
Total Bilirubin: 0.2 mg/dL — ABNORMAL LOW (ref 0.3–1.2)
Total Protein: 5.9 g/dL — ABNORMAL LOW (ref 6.5–8.1)

## 2019-01-09 LAB — PHOSPHORUS: Phosphorus: 2.8 mg/dL (ref 2.5–4.6)

## 2019-01-09 LAB — MAGNESIUM: Magnesium: 1.7 mg/dL (ref 1.7–2.4)

## 2019-01-09 LAB — GLUCOSE, CAPILLARY
Glucose-Capillary: 104 mg/dL — ABNORMAL HIGH (ref 70–99)
Glucose-Capillary: 141 mg/dL — ABNORMAL HIGH (ref 70–99)
Glucose-Capillary: 90 mg/dL (ref 70–99)
Glucose-Capillary: 101 mg/dL — ABNORMAL HIGH (ref 70–99)

## 2019-01-09 NOTE — Plan of Care (Signed)

## 2019-01-09 NOTE — Progress Notes (Signed)
PROGRESS NOTE    Katherine [name]  [id number] DOB: [birth year] DOA: 01/03/2019 PCP: Katherine Roys, DO   Brief Narrative:  78 y.o. BF PMHx PE on anticoagulation with Eliquis, CKD stage III, DM-2, HTN, COPD, seizures, CVA, chronic debility/deconditioning-virtually bedbound-nursing home resident-recent admission to [facility] from 11/7-11/17 with 19 pneumonitis-treated with steroids and remdesivir-Hospital course then was complicated episodes of unresponsiveness-thought to be secondary to breakthrough seizures-Vimpat was added to Keppra-patient was subsequently discharged to SNF on 11/17.    Patient presented to [facility] ED after a period off unresponsiveness at SNF-while in the ED-she was found to be febrile up to 101 F-she was thought to have sepsis with hypotension secondary to UTI and admitted to the hospitalist service (has history of ESBL E. coli).   Subjective: 11/26 A/O x4, negative N/B, negative CP, negative S OB    Assessment & Plan:   Principal Problem:   UTI (urinary tract infection) Active Problems:   Essential hypertension   COPD (chronic obstructive pulmonary disease) (HCC)   Acute kidney injury superimposed on CKD (HCC)   Sepsis (HCC)   Chronic diastolic heart failure (HCC)   Pulmonary embolism (HCC)   COVID-19 virus infection   Acute on chronic anemia   Sepsis secondary to UTI ([place])   Acute renal failure superimposed on stage 3b chronic kidney disease (HCC)   Seizure disorder (HCC)   Diabetes mellitus type 2, controlled, with complications ([place])   Sepsis with hypotension secondary to UTI:  -Sepsis physiology improving-continue meropenem.   -Blood cultures negative so far, urine culture inconclusive.  Patient has prior history of ESBL E. coli.  Will treat for total of 7 days with a stop date of 01/10/2019.  Thereafter discharged back to SNF.  Results for Katherine, [name] (MRN [id number]) as of 01/06/2019 11:27  Ref. Range 01/05/2019 03:47 01/06/2019 02:12   Procalcitonin Latest Units: ng/mL <0.10 <0.10  -Resolved discharge on 11/27   Acute on CKD stage III (baseline  Cr 1.5-1.6) Recent Labs  Lab 01/04/19 0350 01/05/19 0347 01/06/19 0212 01/07/19 0325 01/08/19 0030  CREATININE 1.32* 1.27* 1.28* 1.20* 1.25*  -Better than baseline monitor closely  Recent history of Covid 19 Viral pneumonia:  -Appropriately treated last admission with steroids and remdesivir.   -Chest x-ray on admission negative for pneumonia.   -Suspect CRP elevated from UTI and acute issues-rather than active ongoing Covid pneumonitis. COVID-19 Labs  Recent Labs    01/07/19 0325 01/08/19 0030  DDIMER 2.85* 2.72*  CRP 4.5* 4.3*    Lab Results  Component Value Date   SARSCOV2NAA POSITIVE (A) 12/19/2018   SARSCOV2NAA NOT DETECTED 11/24/2018   [place] NEGATIVE 11/21/2018   [place] NEGATIVE 09/06/2018   COVID-19 Medications: Steroids:11/6>>11/16 Remdesivir: 11/6 >>11/10  Anemia: -Secondary to chronic disease/CKD-probably slightly worse due to acute illness.  No evidence of blood loss.  Follow.  Seizure disorder:  -Continue Katherine [name] 100 mg  BID -Keppra. 1000 mg daily -During last admission-episodes of unresponsiveness was thought to be due to breakthrough seizures. -During his hospitalization no signs or symptoms of seizure.  Pulmonary embolism:  -Continue Eliquis 5 mg BID  History of CVA:  -Continue Eliquis and statin  Chronic diastolic CHF -Strict in and out -1.8 L -Daily weight Filed Weights   01/03/19 2353 01/08/19 1700  Weight: 106.2 kg 103.8 kg    Diabetes type 2 controlled with complication -[id number] hemoglobin A1c= 5.1 -Continue linagliptin -Sensitive SSI  GERD:  -Continue PPI  Weakness/deconditioning/bedbound status:  -Discharge on 11/27  Obesity:  -BMI of 41,  follow with PCP.    DVT prophylaxis: Eliquis Code Status: Full Family Communication:  Disposition Plan: TBD   Consultants:     Procedures/Significant Events:     I have personally reviewed and interpreted all radiology studies and my findings are as above.  VENTILATOR SETTINGS:    Cultures   Antimicrobials: Anti-infectives (From admission, onward)   Start     Stop   01/05/19 1000  ceFEPIme (MAXIPIME) 2 g in sodium chloride 0.9 % 100 mL IVPB  Status:  Discontinued     01/04/19 0507   01/04/19 0600  vancomycin (VANCOCIN) 1,250 mg in sodium chloride 0.9 % 250 mL IVPB  Status:  Discontinued     01/04/19 0506   01/04/19 0600  meropenem (MERREM) 1 g in sodium chloride 0.9 % 100 mL IVPB             Devices    LINES / TUBES:      Continuous Infusions: . sodium chloride Stopped (01/06/19 0544)  . meropenem (MERREM) IV 1 g (01/09/19 0551)     Objective: Vitals:   01/08/19 1700 01/08/19 2000 01/09/19 0335 01/09/19 0704  BP:  118/75 125/71 (!) 159/85  Pulse:  81 86 81  Resp:  20 20 19   Temp:  98.8 F (37.1 C) 98.7 F (37.1 C) 98.3 F (36.8 C)  TempSrc:  Axillary Oral Oral  SpO2:  99% 99% 99%  Weight: 103.8 kg     Height:        Intake/Output Summary (Last 24 hours) at 01/09/2019 [id number] Last data filed at 01/08/2019 2000 Gross per 24 hour  Intake -  Output 700 ml  Net -700 ml   Filed Weights   01/03/19 2353 01/08/19 1700  Weight: 106.2 kg 103.8 kg   Physical Exam:  General: A/O x4, no acute respiratory distress, patient refuses to get out of bed. Eyes: negative scleral hemorrhage, negative anisocoria, negative icterus ENT: Negative Runny nose, negative gingival bleeding, Neck:  Negative scars, masses, torticollis, lymphadenopathy, JVD Lungs: Clear to auscultation bilaterally without wheezes or crackles Cardiovascular: Regular rate and rhythm without murmur gallop or rub normal S1 and S2 Abdomen: Morbidly obese, negative abdominal pain, nondistended, positive soft, bowel sounds, no rebound, no ascites, no appreciable mass Extremities: No significant cyanosis, clubbing, or  edema bilateral lower extremities Skin: Negative rashes, lesions, ulcers Psychiatric:  Negative depression, negative anxiety, negative fatigue, negative mania  Central nervous system:  Cranial nerves II through XII intact, tongue/uvula midline, all extremities muscle strength 5/5, sensation intact throughout, negative dysarthria, negative expressive aphasia, negative receptive aphasia.  .     Data Reviewed: Care during the described time interval was provided by me .  I have reviewed this patient's available data, including medical history, events of note, physical examination, and all test results as part of my evaluation.   CBC: Recent Labs  Lab 01/04/19 0350 01/05/19 0347 01/06/19 0212 01/07/19 0325 01/08/19 0030  WBC 7.6 5.5 5.8 5.3 6.8  NEUTROABS 5.8 3.9 4.0 3.3 4.5  HGB 8.5* 7.8* 7.7* 7.9* 8.0*  HCT 27.4* 25.5* 24.6* 25.3* 25.7*  MCV 89.0 87.9 87.5 87.2 87.7  PLT 205 192 190 199 [id number]   Basic Metabolic Panel: Recent Labs  Lab 01/04/19 0350 01/05/19 0347 01/06/19 0212 01/07/19 0325 01/08/19 0030  NA 141 141 142 140 141  K 4.5 4.1 3.8 3.9 4.2  CL 113* 114* 114* 110 112*  CO2 23 22 22 24 22   GLUCOSE 113* 98 93 84 72  BUN  19 17 14 13 14   CREATININE 1.32* 1.27* 1.28* 1.20* 1.25*  CALCIUM 8.2* 8.7* 8.6* 8.9 8.9   GFR: Estimated Creatinine Clearance: 43.4 mL/min (A) (by C-G formula based on SCr of 1.25 mg/dL (H)). Liver Function Tests: Recent Labs  Lab 01/04/19 0350 01/05/19 0347 01/06/19 0212 01/07/19 0325 01/08/19 0030  AST 21 16 26 25 28   ALT 24 21 22 22 21   ALKPHOS 90 90 93 97 98  BILITOT 0.7 0.4 0.4 0.4 0.4  PROT 5.7* 5.6* 5.2* 5.5* 5.5*  ALBUMIN 2.0* 1.9* 1.7* 1.8* 1.9*   No results for input(s): LIPASE, AMYLASE in the last 168 hours. No results for input(s): AMMONIA in the last 168 hours. Coagulation Profile: Recent Labs  Lab 01/02/19 2302  INR 1.6*   Cardiac Enzymes: No results for input(s): CKTOTAL, CKMB, CKMBINDEX, TROPONINI in the last 168  hours. BNP (last 3 results) No results for input(s): PROBNP in the last 8760 hours. HbA1C: No results for input(s): HGBA1C in the last 72 hours. CBG: Recent Labs  Lab 01/08/19 0715 01/08/19 1108 01/08/19 1528 01/08/19 2020 01/09/19 0703  GLUCAP 78 120* 132* 131* 104*   Lipid Profile: No results for input(s): CHOL, HDL, LDLCALC, TRIG, CHOLHDL, LDLDIRECT in the last 72 hours. Thyroid Function Tests: No results for input(s): TSH, T4TOTAL, FREET4, T3FREE, THYROIDAB in the last 72 hours. Anemia Panel: No results for input(s): VITAMINB12, FOLATE, FERRITIN, TIBC, IRON, RETICCTPCT in the last 72 hours. Urine analysis:    Component Value Date/Time   COLORURINE YELLOW (A) 01/02/2019 2303   APPEARANCEUR CLOUDY (A) 01/02/2019 2303   APPEARANCEUR Clear 02/20/2013 1459   LABSPEC 1.012 01/02/2019 2303   LABSPEC 1.012 02/20/2013 1459   PHURINE 7.0 01/02/2019 2303   GLUCOSEU NEGATIVE 01/02/2019 2303   GLUCOSEU Negative 02/20/2013 1459   HGBUR NEGATIVE 01/02/2019 2303   BILIRUBINUR NEGATIVE 01/02/2019 2303   BILIRUBINUR Negative 02/20/2013 [place] 01/02/2019 2303   PROTEINUR 30 (A) 01/02/2019 2303   NITRITE NEGATIVE 01/02/2019 2303   LEUKOCYTESUR LARGE (A) 01/02/2019 2303   LEUKOCYTESUR Negative 02/20/2013 1459   Sepsis Labs: @LABRCNTIP (procalcitonin:4,lacticidven:4)  ) Recent Results (from the past 240 hour(s))  Blood Culture (routine x 2)     Status: None (Preliminary result)   Collection Time: 01/02/19 11:03 PM   Specimen: BLOOD  Result Value Ref Range Status   Specimen Description BLOOD RIGHT FOREARM  Final   Special Requests   Final    BOTTLES DRAWN AEROBIC AND ANAEROBIC Blood Culture results may not be optimal due to an inadequate volume of blood received in culture bottles   Culture   Final    NO GROWTH 4 DAYS Performed at [facility], [address] [place], [place] [address]    Report Status PENDING  Incomplete  Blood Culture (routine x  2)     Status: None (Preliminary result)   Collection Time: 01/02/19 11:03 PM   Specimen: BLOOD  Result Value Ref Range Status   Specimen Description BLOOD RIGHT ASSIST CONTROL  Final   Special Requests   Final    BOTTLES DRAWN AEROBIC AND ANAEROBIC Blood Culture adequate volume   Culture   Final    NO GROWTH 4 DAYS Performed at [facility], [address], [place],  [address]    Report Status PENDING  Incomplete  Urine culture     Status: Abnormal   Collection Time: 01/02/19 11:03 PM   Specimen: In/Out Cath Urine  Result Value Ref Range Status  Specimen Description   Final    IN/OUT CATH URINE Performed at [facility], [address], [place], [place] [address]    Special Requests   Final    NONE Performed at [facility], [address] [place], [place] [address]    Culture 80,000 COLONIES/mL YEAST (A)  Final   Report Status 01/04/2019 FINAL  Final         Radiology Studies: No results found.      Scheduled Meds: . apixaban  5 mg Oral BID  . atorvastatin  80 mg Oral QPM  . FLUoxetine  40 mg Oral Daily  . gabapentin  300 mg Oral QHS  . insulin aspart  0-9 Units Subcutaneous TID WC  . lacosamide  100 mg Oral BID  . levETIRAcetam  1,000 mg Oral Daily  . linagliptin  5 mg Oral Daily  . pantoprazole  40 mg Oral Daily  . sodium chloride flush  3 mL Intravenous Q12H  . vitamin C  500 mg Oral Daily  . zinc sulfate  220 mg Oral Daily   Continuous Infusions: . sodium chloride Stopped (01/06/19 0544)  . meropenem (MERREM) IV 1 g (01/09/19 0551)     LOS: 6 days   The patient is critically ill with multiple organ systems failure and requires high complexity decision making for assessment and support, frequent evaluation and titration of therapies, application of advanced monitoring technologies and extensive interpretation of multiple databases. Critical Care Time devoted to patient care services described in this note  Time  spent: 40 minutes     Jeremia Groot, Geraldo Docker, MD [facility] Pager [phone]  If 7PM-7AM, please contact night-coverage [email or web address] Password [email or web address] 01/09/2019, 8:12 AM

## 2019-01-09 NOTE — Progress Notes (Signed)
Update given to pt's spouse listed in chart at this time.

## 2019-01-10 LAB — CBC WITH DIFFERENTIAL/PLATELET
Abs Immature Granulocytes: 0.02 10*3/uL (ref 0.00–0.07)
Basophils Absolute: 0 10*3/uL (ref 0.0–0.1)
Basophils Relative: 0 %
Eosinophils Absolute: 0.1 10*3/uL (ref 0.0–0.5)
Eosinophils Relative: 2 %
HCT: 25.5 % — ABNORMAL LOW (ref 36.0–46.0)
Hemoglobin: 7.9 g/dL — ABNORMAL LOW (ref 12.0–15.0)
Immature Granulocytes: 0 %
Lymphocytes Relative: 25 %
Lymphs Abs: 1.4 10*3/uL (ref 0.7–4.0)
MCH: 26.7 pg (ref 26.0–34.0)
MCHC: 31 g/dL (ref 30.0–36.0)
MCV: 86.1 fL (ref 80.0–100.0)
Monocytes Absolute: 0.5 10*3/uL (ref 0.1–1.0)
Monocytes Relative: 8 %
Neutro Abs: 3.7 10*3/uL (ref 1.7–7.7)
Neutrophils Relative %: 65 %
Platelets: 210 10*3/uL (ref 150–400)
RBC: 2.96 MIL/uL — ABNORMAL LOW (ref 3.87–5.11)
RDW: 17.4 % — ABNORMAL HIGH (ref 11.5–15.5)
WBC: 5.7 10*3/uL (ref 4.0–10.5)
nRBC: 0 % (ref 0.0–0.2)

## 2019-01-10 LAB — GLUCOSE, CAPILLARY
Glucose-Capillary: 84 mg/dL (ref 70–99)
Glucose-Capillary: 123 mg/dL — ABNORMAL HIGH (ref 70–99)
Glucose-Capillary: 95 mg/dL (ref 70–99)
Glucose-Capillary: 83 mg/dL (ref 70–99)

## 2019-01-10 LAB — MAGNESIUM: Magnesium: 1.5 mg/dL — ABNORMAL LOW (ref 1.7–2.4)

## 2019-01-10 LAB — COMPREHENSIVE METABOLIC PANEL
ALT: 21 U/L (ref 0–44)
AST: 29 U/L (ref 15–41)
Albumin: 1.9 g/dL — ABNORMAL LOW (ref 3.5–5.0)
Alkaline Phosphatase: 103 U/L (ref 38–126)
Anion gap: 7 (ref 5–15)
BUN: 14 mg/dL (ref 8–23)
CO2: 26 mmol/L (ref 22–32)
Calcium: 9.3 mg/dL (ref 8.9–10.3)
Chloride: 107 mmol/L (ref 98–111)
Creatinine, Ser: 1.07 mg/dL — ABNORMAL HIGH (ref 0.44–1.00)
GFR calc Af Amer: 58 mL/min — ABNORMAL LOW (ref >60)
GFR calc non Af Amer: 50 mL/min — ABNORMAL LOW (ref >60)
Glucose, Bld: 90 mg/dL (ref 70–99)
Potassium: 4.3 mmol/L (ref 3.5–5.1)
Sodium: 140 mmol/L (ref 135–145)
Total Bilirubin: 0.3 mg/dL (ref 0.3–1.2)
Total Protein: 5.5 g/dL — ABNORMAL LOW (ref 6.5–8.1)

## 2019-01-10 LAB — PHOSPHORUS: Phosphorus: 1.9 mg/dL — ABNORMAL LOW (ref 2.5–4.6)

## 2019-01-10 MED ORDER — MAGNESIUM OXIDE 400 (241.3 MG) MG PO TABS
400.0000 mg | ORAL_TABLET | Freq: Two times a day (BID) | ORAL | Status: AC
  Administered 2019-01-10 (×2): 400 mg via ORAL
  Filled 2019-01-10 (×2): qty 1

## 2019-01-10 NOTE — Care Management Important Message (Signed)
Important Message  Patient Details  Name: Markelle Zaragoza MRN: [id number] Date of Birth: November 19, 1940   Medicare Important Message Given:  Yes - Important Message mailed due to current National Emergency  Verbal consent obtained due to current National Emergency  Relationship to patient: Spouse/Significant Other   Call Date: 01/10/19  Time: 1127   Outcome: Spoke with contact Important Message mailed to: Patient address on file    Delorse Lek 01/10/2019, 11:27 AM

## 2019-01-10 NOTE — Consult Note (Signed)
Pharmacy Antibiotic Note  Katherine [name] is a 78 y.o. female re-admitted to [place] [facility] 01/03/2019 with sepsis.  She was discharged from [facility] on 12-31-18 to [facility], where she was found unresponsive in bed tonight.  While at [name], she completed a course of Remdesivir and Unasyn for aspiration pneumonia.   Pharmacy has been consulted to dose meropenem for possible ESBL infection.    Plan:  Meropenem 1 gram iv Q 12 hours. To complete tonight at 2359    Height: 5\' 3"  (160 cm) Weight: 227 lb 15.3 oz (103.4 kg) IBW/kg (Calculated) : 52.4  Temp (24hrs), Avg:98.7 F (37.1 C), Min:98.3 F (36.8 C), Max:99.5 F (37.5 C)  Recent Labs  Lab 01/03/19 1947 01/04/19 0350  01/06/19 0212 01/07/19 0325 01/08/19 0030 01/09/19 0845 01/10/19 0100  WBC 8.1 7.6   < > 5.8 5.3 6.8 5.7 5.7  CREATININE 1.33* 1.32*   < > 1.28* 1.20* 1.25* 1.12* 1.07*  LATICACIDVEN 0.6 1.4  --   --   --   --   --   --    < > = values in this interval not displayed.    Estimated Creatinine Clearance: 50.6 mL/min (A) (by C-G formula based on SCr of 1.07 mg/dL (H)).    Allergies  Allergen Reactions  . Bee Venom Swelling  . Enalapril Maleate Swelling     Aniyiah Zell A. Levada Dy, PharmD, BCPS, FNKF Clinical Pharmacist [place] Please utilize Amion for appropriate phone number to reach the unit pharmacist ([place])    01/10/2019 11:14 AM

## 2019-01-10 NOTE — Progress Notes (Addendum)
PROGRESS NOTE    Katherine [name]  [id number] DOB: 01/22/1941 DOA: 01/03/2019 PCP: Valerie Roys, DO   Brief Narrative:  78 y.o. BF PMHx PE on anticoagulation with Eliquis, CKD stage III, DM-2, HTN, COPD, seizures, CVA, chronic debility/deconditioning-virtually bedbound-nursing home resident-recent admission to [facility] from 11/7-11/17 with 19 pneumonitis-treated with steroids and remdesivir-Hospital course then was complicated episodes of unresponsiveness-thought to be secondary to breakthrough seizures-Vimpat was added to Keppra-patient was subsequently discharged to SNF on 11/17.    Patient presented to [facility] ED after a period off unresponsiveness at SNF-while in the ED-she was found to be febrile up to 101 F-she was thought to have sepsis with hypotension secondary to UTI and admitted to the hospitalist service (has history of ESBL E. coli).   Subjective: 11/27 last 24 hours afebrile.  Uncooperative, sleeps most of the day.  Refuses to get out of the bed.  When awake complains that she has Covid, but refuses to participate in respiratory care despite daily encouragement by staff.     Assessment & Plan:   Principal Problem:   UTI (urinary tract infection) Active Problems:   Essential hypertension   COPD (chronic obstructive pulmonary disease) (HCC)   Acute kidney injury superimposed on CKD (HCC)   Sepsis (HCC)   Chronic diastolic heart failure (HCC)   Pulmonary embolism (HCC)   COVID-19 virus infection   Acute on chronic anemia   Sepsis secondary to UTI ([place])   Acute renal failure superimposed on stage 3b chronic kidney disease (HCC)   Seizure disorder (HCC)   Diabetes mellitus type 2, controlled, with complications ([place])   Sepsis with hypotension secondary to UTI:  -Sepsis physiology improving-continue meropenem.   -Blood cultures negative so far, urine culture inconclusive.  Patient has prior history of ESBL E. coli.  Will treat for total of 7 days with a stop date of  01/10/2019.  Thereafter discharged back to SNF.  Results for HUMA, IMPELLIZZERI (MRN [id number]) as of 01/06/2019 11:27  Ref. Range 01/05/2019 03:47 01/06/2019 02:12  Procalcitonin Latest Units: ng/mL <0.10 <0.10  -Resolved discharge on 11/27   Acute on CKD stage III (baseline  Cr 1.5-1.6) Recent Labs  Lab 01/06/19 0212 01/07/19 0325 01/08/19 0030 01/09/19 0845 01/10/19 0100  CREATININE 1.28* 1.20* 1.25* 1.12* 1.07*  -Better than baseline monitor closely  Recent history of Covid 19 Viral pneumonia:  -Appropriately treated last admission with steroids and remdesivir.   -Chest x-ray on admission negative for pneumonia.   -Suspect CRP elevated from UTI and acute issues-rather than active ongoing Covid pneumonitis. COVID-19 Labs  Recent Labs    01/08/19 0030  DDIMER 2.72*  CRP 4.3*    Lab Results  Component Value Date   SARSCOV2NAA POSITIVE (A) 12/19/2018   SARSCOV2NAA NOT DETECTED 11/24/2018   [place] NEGATIVE 11/21/2018   [place] NEGATIVE 09/06/2018   COVID-19 Medications: Steroids:11/6>>11/16 Remdesivir: 11/6 >>11/10  Anemia: -Secondary to chronic disease/CKD-probably slightly worse due to acute illness.  No evidence of blood loss.  Follow. Recent Labs  Lab 01/06/19 0212 01/07/19 0325 01/08/19 0030 01/09/19 0845 01/10/19 0100  HGB 7.7* 7.9* 8.0* 8.6* 7.9*  -Stable  Seizure disorder:  -Continue Vimpa 100 mg  BID -Keppra. 1000 mg daily -During last admission-episodes of unresponsiveness was thought to be due to breakthrough seizures. -During his hospitalization no signs or symptoms of seizure.  Pulmonary embolism:  -Continue Eliquis 5 mg BID  History of CVA:  -Continue Eliquis and statin  Chronic diastolic CHF -Strict in and out -2.1 L -Daily  weight Filed Weights   01/03/19 2353 01/08/19 1700 01/10/19 0600  Weight: 106.2 kg 103.8 kg 103.4 kg    Diabetes type 2 controlled with complication -[id number] hemoglobin A1c= 5.1 -Continue  linagliptin -Sensitive SSI  GERD:  -Continue PPI  Weakness/deconditioning/bedbound status:  -Discharge on 11/28 [facility]  Obesity:  -BMI of 41, follow with PCP.  Goals of care -Plan is to discharge patient to [facility] on 11/28 after speaking with the family.  At that time we will also attempt to have patient's CODE STATUS changed to DNR secondary to patient's lack of compliance with any type of in-house rehabilitative efforts.    DVT prophylaxis: Eliquis Code Status: Full Family Communication: 11/27 called and left message with [name] (spouse) that we were going to discharge patient on 11/28. Disposition Plan: TBD   Consultants:    Procedures/Significant Events:     I have personally reviewed and interpreted all radiology studies and my findings are as above.  VENTILATOR SETTINGS:    Cultures   Antimicrobials: Anti-infectives (From admission, onward)   Start     Stop   01/05/19 1000  ceFEPIme (MAXIPIME) 2 g in sodium chloride 0.9 % 100 mL IVPB  Status:  Discontinued     01/04/19 0507   01/04/19 0600  vancomycin (VANCOCIN) 1,250 mg in sodium chloride 0.9 % 250 mL IVPB  Status:  Discontinued     01/04/19 0506   01/04/19 0600  meropenem (MERREM) 1 g in sodium chloride 0.9 % 100 mL IVPB             Devices    LINES / TUBES:      Continuous Infusions: . sodium chloride Stopped (01/06/19 0544)  . meropenem (MERREM) IV 1 g (01/10/19 0540)     Objective: Vitals:   01/10/19 0300 01/10/19 0324 01/10/19 0600 01/10/19 0819  BP:  115/72  (!) 93/54  Pulse: 94 (!) 102  89  Resp: (!) 23 16  (!) 21  Temp:  99.5 F (37.5 C)  98.4 F (36.9 C)  TempSrc:  Oral  Oral  SpO2: (!) 89% 94%  91%  Weight:   103.4 kg   Height:        Intake/Output Summary (Last 24 hours) at 01/10/2019 1306 Last data filed at 01/10/2019 0325 Gross per 24 hour  Intake 180 ml  Output 450 ml  Net -270 ml   Filed Weights   01/03/19 2353 01/08/19  1700 01/10/19 0600  Weight: 106.2 kg 103.8 kg 103.4 kg   Physical Exam:  General: A/O x4, no acute respiratory distress, total lack of interest in rehabilitation (lays in bed refuses to participate in any sort of pulmonary or physical rehabilitation) Eyes: negative scleral hemorrhage, negative anisocoria, negative icterus ENT: Negative Runny nose, negative gingival bleeding, Neck:  Negative scars, masses, torticollis, lymphadenopathy, JVD Lungs: Clear to auscultation bilaterally without wheezes or crackles Cardiovascular: Regular rate and rhythm without murmur gallop or rub normal S1 and S2 Abdomen: negative abdominal pain, nondistended, positive soft, bowel sounds, no rebound, no ascites, no appreciable mass Extremities: No significant cyanosis, clubbing, or edema bilateral lower extremities Skin: Negative rashes, lesions, ulcers Psychiatric:  Negative depression, negative anxiety, negative fatigue, negative mania  Central nervous system:  Cranial nerves II through XII intact, tongue/uvula midline,  negative dysarthria, negative expressive aphasia, negative receptive aphasia.   .     Data Reviewed: Care during the described time interval was provided by me .  I have reviewed this patient's available  data, including medical history, events of note, physical examination, and all test results as part of my evaluation.   CBC: Recent Labs  Lab 01/06/19 0212 01/07/19 0325 01/08/19 0030 01/09/19 0845 01/10/19 0100  WBC 5.8 5.3 6.8 5.7 5.7  NEUTROABS 4.0 3.3 4.5 3.7 3.7  HGB 7.7* 7.9* 8.0* 8.6* 7.9*  HCT 24.6* 25.3* 25.7* 27.7* 25.5*  MCV 87.5 87.2 87.7 87.4 86.1  PLT 190 199 220 229 [id number]   Basic Metabolic Panel: Recent Labs  Lab 01/06/19 0212 01/07/19 0325 01/08/19 0030 01/09/19 0845 01/10/19 0100  NA 142 140 141 141 140  K 3.8 3.9 4.2 4.1 4.3  CL 114* 110 112* 108 107  CO2 22 24 22 26 26   GLUCOSE 93 84 72 92 90  BUN 14 13 14 14 14   CREATININE 1.28* 1.20* 1.25* 1.12*  1.07*  CALCIUM 8.6* 8.9 8.9 9.4 9.3  MG  --   --   --  1.7 1.5*  PHOS  --   --   --  2.8 1.9*   GFR: Estimated Creatinine Clearance: 50.6 mL/min (A) (by C-G formula based on SCr of 1.07 mg/dL (H)). Liver Function Tests: Recent Labs  Lab 01/06/19 0212 01/07/19 0325 01/08/19 0030 01/09/19 0845 01/10/19 0100  AST 26 25 28 29 29   ALT 22 22 21 22 21   ALKPHOS 93 97 98 107 103  BILITOT 0.4 0.4 0.4 0.2* 0.3  PROT 5.2* 5.5* 5.5* 5.9* 5.5*  ALBUMIN 1.7* 1.8* 1.9* 2.0* 1.9*   No results for input(s): LIPASE, AMYLASE in the last 168 hours. No results for input(s): AMMONIA in the last 168 hours. Coagulation Profile: No results for input(s): INR, PROTIME in the last 168 hours. Cardiac Enzymes: No results for input(s): CKTOTAL, CKMB, CKMBINDEX, TROPONINI in the last 168 hours. BNP (last 3 results) No results for input(s): PROBNP in the last 8760 hours. HbA1C: No results for input(s): HGBA1C in the last 72 hours. CBG: Recent Labs  Lab 01/09/19 1128 01/09/19 1629 01/09/19 2121 01/10/19 0816 01/10/19 1217  GLUCAP 141* 90 101* 84 123*   Lipid Profile: No results for input(s): CHOL, HDL, LDLCALC, TRIG, CHOLHDL, LDLDIRECT in the last 72 hours. Thyroid Function Tests: No results for input(s): TSH, T4TOTAL, FREET4, T3FREE, THYROIDAB in the last 72 hours. Anemia Panel: No results for input(s): VITAMINB12, FOLATE, FERRITIN, TIBC, IRON, RETICCTPCT in the last 72 hours. Urine analysis:    Component Value Date/Time   COLORURINE YELLOW (A) 01/02/2019 2303   APPEARANCEUR CLOUDY (A) 01/02/2019 2303   APPEARANCEUR Clear 02/20/2013 1459   LABSPEC 1.012 01/02/2019 2303   LABSPEC 1.012 02/20/2013 1459   PHURINE 7.0 01/02/2019 2303   GLUCOSEU NEGATIVE 01/02/2019 2303   GLUCOSEU Negative 02/20/2013 1459   HGBUR NEGATIVE 01/02/2019 2303   BILIRUBINUR NEGATIVE 01/02/2019 2303   BILIRUBINUR Negative 02/20/2013 1459   [place] 01/02/2019 2303   PROTEINUR 30 (A) 01/02/2019 2303    NITRITE NEGATIVE 01/02/2019 2303   LEUKOCYTESUR LARGE (A) 01/02/2019 2303   LEUKOCYTESUR Negative 02/20/2013 1459   Sepsis Labs: @LABRCNTIP (procalcitonin:4,lacticidven:4)  ) Recent Results (from the past 240 hour(s))  Blood Culture (routine x 2)     Status: None (Preliminary result)   Collection Time: 01/02/19 11:03 PM   Specimen: BLOOD  Result Value Ref Range Status   Specimen Description BLOOD RIGHT FOREARM  Final   Special Requests   Final    BOTTLES DRAWN AEROBIC AND ANAEROBIC Blood Culture results may not be optimal due to an inadequate volume of blood  received in culture bottles   Culture   Final    NO GROWTH 4 DAYS Performed at [facility], [address] [place], [place] [address]    Report Status PENDING  Incomplete  Blood Culture (routine x 2)     Status: None (Preliminary result)   Collection Time: 01/02/19 11:03 PM   Specimen: BLOOD  Result Value Ref Range Status   Specimen Description BLOOD RIGHT ASSIST CONTROL  Final   Special Requests   Final    BOTTLES DRAWN AEROBIC AND ANAEROBIC Blood Culture adequate volume   Culture   Final    NO GROWTH 4 DAYS Performed at [facility], [address], [place], [place] [address]    Report Status PENDING  Incomplete  Urine culture     Status: Abnormal   Collection Time: 01/02/19 11:03 PM   Specimen: In/Out Cath Urine  Result Value Ref Range Status   Specimen Description   Final    IN/OUT CATH URINE Performed at [facility], [address], [place], [place] [address]    Special Requests   Final    NONE Performed at [facility], [address] [place], [place] [address]    Culture 80,000 COLONIES/mL YEAST (A)  Final   Report Status 01/04/2019 FINAL  Final         Radiology Studies: No results found.      Scheduled Meds: . apixaban  5 mg Oral BID  . atorvastatin  80 mg Oral QPM  . FLUoxetine  40 mg Oral Daily  . gabapentin  300 mg Oral QHS  . insulin aspart   0-9 Units Subcutaneous TID WC  . lacosamide  100 mg Oral BID  . levETIRAcetam  1,000 mg Oral Daily  . linagliptin  5 mg Oral Daily  . magnesium oxide  400 mg Oral BID  . pantoprazole  40 mg Oral Daily  . sodium chloride flush  3 mL Intravenous Q12H  . vitamin C  500 mg Oral Daily  . zinc sulfate  220 mg Oral Daily   Continuous Infusions: . sodium chloride Stopped (01/06/19 0544)  . meropenem (MERREM) IV 1 g (01/10/19 0540)     LOS: 7 days   The patient is critically ill with multiple organ systems failure and requires high complexity decision making for assessment and support, frequent evaluation and titration of therapies, application of advanced monitoring technologies and extensive interpretation of multiple databases. Critical Care Time devoted to patient care services described in this note  Time spent: 40 minutes     Rusty Villella, Geraldo Docker, MD [facility] Pager [phone]  If 7PM-7AM, please contact night-coverage [email or web address] Password [email or web address] 01/10/2019, 1:06 PM

## 2019-01-10 NOTE — Progress Notes (Signed)
Updated patient's husband [name] on plan of care and condition. All questions answered.

## 2019-01-11 DIAGNOSIS — J431 Panlobular emphysema: Secondary | ICD-10-CM

## 2019-01-11 DIAGNOSIS — Z9119 Patient's noncompliance with other medical treatment and regimen: Secondary | ICD-10-CM

## 2019-01-11 DIAGNOSIS — Z794 Long term (current) use of insulin: Secondary | ICD-10-CM

## 2019-01-11 DIAGNOSIS — U071 COVID-19: Secondary | ICD-10-CM

## 2019-01-11 LAB — CBC WITH DIFFERENTIAL/PLATELET
Abs Immature Granulocytes: 0.02 10*3/uL (ref 0.00–0.07)
Basophils Absolute: 0 10*3/uL (ref 0.0–0.1)
Basophils Relative: 0 %
Eosinophils Absolute: 0.2 10*3/uL (ref 0.0–0.5)
Eosinophils Relative: 3 %
HCT: 26.8 % — ABNORMAL LOW (ref 36.0–46.0)
Hemoglobin: 8.2 g/dL — ABNORMAL LOW (ref 12.0–15.0)
Immature Granulocytes: 0 %
Lymphocytes Relative: 25 %
Lymphs Abs: 1.3 10*3/uL (ref 0.7–4.0)
MCH: 26.6 pg (ref 26.0–34.0)
MCHC: 30.6 g/dL (ref 30.0–36.0)
MCV: 87 fL (ref 80.0–100.0)
Monocytes Absolute: 0.5 10*3/uL (ref 0.1–1.0)
Monocytes Relative: 10 %
Neutro Abs: 3.1 10*3/uL (ref 1.7–7.7)
Neutrophils Relative %: 62 %
Platelets: 225 10*3/uL (ref 150–400)
RBC: 3.08 MIL/uL — ABNORMAL LOW (ref 3.87–5.11)
RDW: 17.5 % — ABNORMAL HIGH (ref 11.5–15.5)
WBC: 5 10*3/uL (ref 4.0–10.5)
nRBC: 0 % (ref 0.0–0.2)

## 2019-01-11 LAB — COMPREHENSIVE METABOLIC PANEL
ALT: 24 U/L (ref 0–44)
AST: 33 U/L (ref 15–41)
Albumin: 2 g/dL — ABNORMAL LOW (ref 3.5–5.0)
Alkaline Phosphatase: 111 U/L (ref 38–126)
Anion gap: 6 (ref 5–15)
BUN: 15 mg/dL (ref 8–23)
CO2: 27 mmol/L (ref 22–32)
Calcium: 9.2 mg/dL (ref 8.9–10.3)
Chloride: 107 mmol/L (ref 98–111)
Creatinine, Ser: 1.12 mg/dL — ABNORMAL HIGH (ref 0.44–1.00)
GFR calc Af Amer: 55 mL/min — ABNORMAL LOW (ref >60)
GFR calc non Af Amer: 47 mL/min — ABNORMAL LOW (ref >60)
Glucose, Bld: 87 mg/dL (ref 70–99)
Potassium: 4.5 mmol/L (ref 3.5–5.1)
Sodium: 140 mmol/L (ref 135–145)
Total Bilirubin: 0.4 mg/dL (ref 0.3–1.2)
Total Protein: 5.8 g/dL — ABNORMAL LOW (ref 6.5–8.1)

## 2019-01-11 LAB — GLUCOSE, CAPILLARY
Glucose-Capillary: 87 mg/dL (ref 70–99)
Glucose-Capillary: 78 mg/dL (ref 70–99)

## 2019-01-11 LAB — MAGNESIUM: Magnesium: 1.7 mg/dL (ref 1.7–2.4)

## 2019-01-11 LAB — PHOSPHORUS: Phosphorus: 2.8 mg/dL (ref 2.5–4.6)

## 2019-01-11 MED ORDER — GUAIFENESIN ER 600 MG PO TB12: 600.0000 mg | ORAL_TABLET | Freq: Two times a day (BID) | ORAL | 0 refills | Status: AC

## 2019-01-11 MED ORDER — ZINC SULFATE 220 (50 ZN) MG PO CAPS: 220.0000 mg | ORAL_CAPSULE | Freq: Every day | ORAL | 0 refills | Status: DC

## 2019-01-11 MED ORDER — ASCORBIC ACID 500 MG PO TABS: 500.0000 mg | ORAL_TABLET | Freq: Every day | ORAL | 0 refills | Status: DC

## 2019-01-11 NOTE — TOC Transition Note (Signed)
Transition of Care [facility]) - CM/SW Discharge Note   Patient Details  Name: Nasim Lands MRN: [id number] Date of Birth: 03-20-1940  Transition of Care [facility]) CM/SW Contact:  Ninfa Meeker, RN Phone Number: 01/11/2019, 12:41 PM   Clinical Narrative:  Patient will DC to: [facility] Anticipated DC date: 01/11/19 Family notified: Husband: multiple attempts, message left Transport by: PTAR : 1:00pm  Patient is medically ready to discharge back to [facility]. RN, Camera operator, and facility are aware of discharge. Case manager faxed DC summary and FL2 to [facility]. Bedside RN to call report to: [phone], pt going to room 18B. Ambulance transport has been requested for patient.      Final next level of care: Skilled Nursing Facility Barriers to Discharge: No Barriers Identified   Patient Goals and CMS Choice Patient states their goals for this hospitalization and ongoing recovery are:: "to go back to [facility]"- per spouse   Choice offered to / list presented to : Spouse  Discharge Placement                       Discharge Plan and Services In-house Referral: NA Discharge Planning Services: NA Post Acute Care Choice: [place]          DME Arranged: N/A         HH Arranged: NA [place] Agency: NA        Social Determinants of Health (SDOH) Interventions     Readmission Risk Interventions Readmission Risk Prevention Plan 11/26/2018 11/22/2018 07/16/2018  Transportation Screening Complete Complete Complete  Medication Review Press photographer) Complete Complete Complete  PCP or Specialist appointment within 3-5 days of discharge (No Data) - Complete  PCP/Specialist Appt Not Complete comments - - -  HRI or [place] (No Data) - Complete  SW Recovery Care/Counseling Consult Complete - Not Complete  SW Consult Not Complete Comments - - na  Palliative Care Screening - - Not Applicable  Medication  Reconcilation ([place] Complete Complete Complete  Some recent data might be hidden

## 2019-01-11 NOTE — Discharge Instructions (Signed)

## 2019-01-11 NOTE — Discharge Summary (Signed)
Physician Discharge Summary  Dairy Wisser [id number] DOB: [birth year] DOA: 01/03/2019  PCP: Valerie Roys, DO  Admit date: 01/03/2019 Discharge date: 01/11/2019  Time spent: 30 minutes  Recommendations for Outpatient Follow-up:   Sepsis with hypotension secondary to UTI:  -Sepsis physiology improving-continue meropenem.  -Blood cultures negative so far, urine culture inconclusive. Patient has prior history of ESBL E. Coli. Completed 7 daysmeropenem ready for discharge back to SNF.   Results for ARTHUR, DECOOK (MRN [id number]) as of 01/06/2019 11:27  Ref. Range 01/05/2019 03:47 01/06/2019 02:12  Procalcitonin Latest Units: ng/mL <0.10 <0.10    Acute on CKD stage III (baseline  Cr 1.5-1.6) Last Labs          Recent Labs  Lab 01/06/19 0212 01/07/19 0325 01/08/19 0030 01/09/19 0845 01/10/19 0100  CREATININE 1.28* 1.20* 1.25* 1.12* 1.07*    -Better than baseline monitor closely  Recent history of Covid 19 Viral pneumonia:  -Appropriately treated last admission with steroids and remdesivir.  -Chest x-ray on admission negative for pneumonia.  -Suspect CRP elevated from UTI and acute issues-rather than active ongoing Covid pneumonitis. COVID-19 Labs  No results for input(s): DDIMER, FERRITIN, LDH, CRP in the last 72 hours.  Lab Results  Component Value Date   SARSCOV2NAA POSITIVE (A) 12/19/2018   SARSCOV2NAA NOT DETECTED 11/24/2018   [place] NEGATIVE 11/21/2018   [place] NEGATIVE 09/06/2018  -COVID-19 Medications:  Steroids:11/6>>11/16  Remdesivir: 11/6 >>11/10  Anemia: -Secondary to chronic disease/CKD-probably slightly worse due to acute illness. No evidence of blood loss. Follow. Last Labs          Recent Labs  Lab 01/06/19 0212 01/07/19 0325 01/08/19 0030 01/09/19 0845 01/10/19 0100  HGB 7.7* 7.9* 8.0* 8.6* 7.9*    -Stable  Seizure disorder:  -Continue Vimpa 100 mg  BID -Keppra. 1000 mg daily -During last admission-episodes  of unresponsiveness was thought to be due to breakthrough seizures. -During his hospitalization no signs or symptoms of seizure.  Pulmonary embolism: -Continue Eliquis 5 mg BID  History of CVA:  -Continue Eliquis and statin  Chronic diastolic CHF -Strict in and out  -1.6 L -Daily weight      Filed Weights   01/03/19 2353 01/08/19 1700 01/10/19 0600  Weight: 106.2 kg 103.8 kg 103.4 kg    Diabetes type 2 controlled with complication -[id number] hemoglobin A1c= 5.1 -Continue linagliptin  GERD: -Continue PPI  Weakness/deconditioning/bedbound status:  -Discharge on 11/28 [facility]  Obesity:  -BMI of 41, follow with PCP.  Goals of care -Plan is to discharge patient to [facility] on 11/28 after speaking with the family.  At that time we will also attempt to have patient's CODE STATUS changed to DNR secondary to patient's lack of compliance with any type of in-house rehabilitative efforts. ADDENDUM; attempted to speak with husband no answer and husband has not returned phone call.  Will discharge patient to SNF     Discharge Diagnoses:  Principal Problem:   UTI (urinary tract infection) Active Problems:   Essential hypertension   COPD (chronic obstructive pulmonary disease) (HCC)   Acute kidney injury superimposed on CKD (HCC)   Sepsis (HCC)   Chronic diastolic heart failure (HCC)   Pulmonary embolism (HCC)   COVID-19 virus infection   Acute on chronic anemia   Sepsis secondary to UTI ([place])   Acute renal failure superimposed on stage 3b chronic kidney disease (HCC)   Seizure disorder ([place])   Diabetes mellitus type 2, controlled, with complications ([place])   Discharge Condition:  Discharge  Diet recommendation: Heart healthy  Filed Weights   01/08/19 1700 01/10/19 0600 01/11/19 0500  Weight: 103.8 kg 103.4 kg 102.7 kg    History of present illness:  78 y.o.BF PMHx PE on anticoagulation with Eliquis, CKD stage III, DM-2, HTN,  COPD, seizures, CVA, chronic debility/deconditioning-virtually bedbound-nursing home resident-recent admission to [facility] from 11/7-11/17 with 19 pneumonitis-treated with steroids and remdesivir-Hospital course then was complicated episodes of unresponsiveness-thought to be secondary to breakthrough seizures-Vimpat was added to Keppra-patient was subsequently discharged to SNF on 11/17.   Patient presented to [facility] ED after a period off unresponsiveness at SNF-while in the ED-she was found to be febrile up to 101 F-she was thought to have sepsis with hypotension secondary to UTI and admitted to the hospitalist service (has history of ESBL E. coli).  Hospital Course:  During his hospitalization patient received antibiotics for UTI sepsis responded well.  Patient noncompliant with any attempt to engage her in physical PT or respiratory toilet.Sheralyn Boatman in bed majority of the day although has been counseled that this is detrimental to her recovery.  Stable for discharge     Cultures   11/19 POC SARS coronavirus negative 11/19 urine positive yeast 11/19 blood RIGHT forearm NGTD 11/19 blood RIGHT assist control NGTD    Antibiotics Anti-infectives (From admission, onward)   Start     Stop   01/05/19 1000  ceFEPIme (MAXIPIME) 2 g in sodium chloride 0.9 % 100 mL IVPB  Status:  Discontinued     01/04/19 0507   01/04/19 0600  vancomycin (VANCOCIN) 1,250 mg in sodium chloride 0.9 % 250 mL IVPB  Status:  Discontinued     01/04/19 0506   01/04/19 0600  meropenem (MERREM) 1 g in sodium chloride 0.9 % 100 mL IVPB     01/10/19 1910       Discharge Exam: Vitals:   01/11/19 0400 01/11/19 0500 01/11/19 0753 01/11/19 0800  BP: 131/84  126/86 129/78  Pulse: 77  86 85  Resp: 20  20 20   Temp: 97.9 F (36.6 C)  97.8 F (36.6 C)   TempSrc: Oral  Oral   SpO2: 99%  96% 97%  Weight:  102.7 kg    Height:       General: A/O x4, no acute respiratory distress, total lack of interest in rehabilitation (lays  in bed refuses to participate in any sort of pulmonary or physical rehabilitation) Eyes: negative scleral hemorrhage, negative anisocoria, negative icterus ENT: Negative Runny nose, negative gingival bleeding, Neck:  Negative scars, masses, torticollis, lymphadenopathy, JVD Lungs: Clear to auscultation bilaterally without wheezes or crackles Cardiovascular: Regular rate and rhythm without murmur gallop or rub normal S1 and S2    Discharge Instructions   Allergies as of 01/11/2019      Reactions   Bee Venom Swelling   Enalapril Maleate Swelling      Medication List    STOP taking these medications   hydrALAZINE 25 MG tablet Commonly known as: APRESOLINE     TAKE these medications   acetaminophen 650 MG CR tablet Commonly known as: TYLENOL Take 650 mg by mouth 3 (three) times daily.   albuterol 0.63 MG/3ML nebulizer solution Commonly known as: ACCUNEB Take 3 mLs by nebulization every 8 (eight) hours as needed for wheezing.   apixaban 5 MG Tabs tablet Commonly known as: ELIQUIS Take 1 tablet (5 mg total) by mouth 2 (two) times daily. To initiate this on 11/29/2018 after completing the 10 mg twice daily treatment  regimen   ascorbic acid 500 MG tablet Commonly known as: VITAMIN C Take 1 tablet (500 mg total) by mouth daily. Start taking on: January 12, 2019   atorvastatin 80 MG tablet Commonly known as: LIPITOR Take 80 mg by mouth every evening.   cetirizine 10 MG tablet Commonly known as: ZYRTEC Take 5 mg by mouth at bedtime.   cholecalciferol 1000 units tablet Commonly known as: VITAMIN D Take 1,000 Units by mouth daily.   diclofenac sodium 1 % Gel Commonly known as: VOLTAREN Apply 2 g topically every 12 (twelve) hours as needed (for left knee pain).   docusate sodium 100 MG capsule Commonly known as: COLACE Take 100 mg by mouth 2 (two) times daily.   FLUoxetine 40 MG capsule Commonly known as: PROZAC Take 40 mg by mouth daily.    Fluticasone-Umeclidin-Vilant 100-62.5-25 MCG/INH Aepb Inhale 1 puff into the lungs daily.   gabapentin 300 MG capsule Commonly known as: NEURONTIN Take 300 mg by mouth at bedtime.   guaiFENesin 600 MG 12 hr tablet Commonly known as: MUCINEX Take 1 tablet (600 mg total) by mouth 2 (two) times daily for 6 days. X 7 days   ipratropium 0.03 % nasal spray Commonly known as: ATROVENT Place 2 sprays into both nostrils 2 (two) times daily.   ipratropium-albuterol 0.5-2.5 (3) MG/3ML Soln Commonly known as: DUONEB Take 3 mLs by nebulization 3 (three) times daily.   Lacosamide 100 MG Tabs Take 1 tablet (100 mg total) by mouth 2 (two) times daily.   levETIRAcetam 500 MG 24 hr tablet Commonly known as: KEPPRA XR Take 2 tablets (1,000 mg total) by mouth daily.   linagliptin 5 MG Tabs tablet Commonly known as: TRADJENTA Take 5 mg by mouth daily.   meclizine 25 MG tablet Commonly known as: ANTIVERT Take 25 mg by mouth every 12 (twelve) hours as needed for dizziness.   multivitamin with minerals Tabs tablet Take 1 tablet by mouth daily.   mupirocin ointment 2 % Commonly known as: BACTROBAN Place 1 application into the nose 2 (two) times daily.   nitroGLYCERIN 0.4 MG SL tablet Commonly known as: NITROSTAT Place 0.4 mg under the tongue every 5 (five) minutes as needed for chest pain.   pantoprazole 40 MG tablet Commonly known as: PROTONIX Take 1 tablet (40 mg total) by mouth daily.   zinc sulfate 220 (50 Zn) MG capsule Take 1 capsule (220 mg total) by mouth daily. Start taking on: January 12, 2019      Allergies  Allergen Reactions  . Bee Venom Swelling  . Enalapril Maleate Swelling   Contact information for after-discharge care    [facility] .   Service: Skilled Nursing Contact information: [place] [phone]               The results of significant diagnostics from this  hospitalization (including imaging, microbiology, ancillary and laboratory) are listed below for reference.    Significant Diagnostic Studies: Dg Chest 2 View  Result Date: 12/19/2018 CLINICAL DATA:  Hemoptysis, cough EXAM: CHEST - 2 VIEW COMPARISON:  11/24/2018 FINDINGS: Increased markings in the lower lungs, likely scarring, similar to prior study. Heart is borderline in size. Aortic atherosclerosis. No acute airspace opacities or effusions. No acute bony abnormality. IMPRESSION: Bibasilar linear densities, likely scarring. No definite acute process. Electronically Signed   By: Rolm Baptise M.D.   On: 12/19/2018 20:59   Ct Head Wo Contrast  Result Date: 12/24/2018 CLINICAL DATA:  Altered level of consciousness EXAM: CT HEAD WITHOUT CONTRAST TECHNIQUE: Contiguous axial images were obtained from the base of the skull through the vertex without intravenous contrast. COMPARISON:  12/19/2018 FINDINGS: Brain: No evidence of acute infarction, hemorrhage, hydrocephalus, extra-axial collection or mass lesion/mass effect. Mild periventricular white matter hypodensity. Vascular: No hyperdense vessel or unexpected calcification. Skull: Normal. Negative for fracture or focal lesion. Sinuses/Orbits: No acute finding. Other: None. IMPRESSION: No acute intracranial pathology.  Small-vessel white matter disease. Electronically Signed   By: Eddie Candle M.D.   On: 12/24/2018 09:04   Ct Head Wo Contrast  Result Date: 12/19/2018 CLINICAL DATA:  Headache, on Eliquis EXAM: CT HEAD WITHOUT CONTRAST TECHNIQUE: Contiguous axial images were obtained from the base of the skull through the vertex without intravenous contrast. COMPARISON:  09/06/2018 FINDINGS: Brain: Patchy chronic small vessel disease throughout the deep white matter. No acute intracranial abnormality. Specifically, no hemorrhage, hydrocephalus, mass lesion, acute infarction, or significant intracranial injury. Vascular: No hyperdense vessel or unexpected  calcification. Skull: No acute calvarial abnormality. Sinuses/Orbits: Visualized paranasal sinuses and mastoids clear. Orbital soft tissues unremarkable. 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 No acute intracranial abnormality. Electronically Signed   By: Rolm Baptise M.D.   On: 12/19/2018 21:00   Ct Angio Chest Pe W And/or Wo Contrast  Result Date: 12/20/2018 CLINICAL DATA:  Nose bleed.  Hemoptysis. EXAM: CT ANGIOGRAPHY CHEST WITH CONTRAST TECHNIQUE: Multidetector CT imaging of the chest was performed using the standard protocol during bolus administration of intravenous contrast. Multiplanar CT image reconstructions and MIPs were obtained to evaluate the vascular anatomy. CONTRAST:  20mL OMNIPAQUE IOHEXOL 350 MG/ML SOLN COMPARISON:  11/21/2018 FINDINGS: Cardiovascular: Contrast injection is sufficient to demonstrate satisfactory opacification of the pulmonary arteries to the segmental level.Again identified are small subsegmental pulmonary emboli involving the left lower lobe, slightly improved from prior study. The main pulmonary artery is dilated measuring approximately 3.3 cm. Aortic calcifications are noted. The heart size is stable. Coronary artery calcifications are noted. Mediastinum/Nodes: --No mediastinal or hilar lymphadenopathy. --No axillary lymphadenopathy. --No supraclavicular lymphadenopathy. --Normal thyroid gland. --The esophagus is unremarkable Lungs/Pleura: There are worsening bibasilar airspace opacities. There is no pneumothorax. There are trace bilateral pleural effusions, right greater than left. The trachea is unremarkable. Upper Abdomen: No acute abnormality. Musculoskeletal: No chest wall abnormality. No acute or significant osseous findings. Review of the MIP images confirms the above findings. IMPRESSION: 1. Improving subsegmental pulmonary emboli involving the left lower lobe. 2. Worsening bibasilar airspace opacities which  may represent atelectasis or infiltrates. 3. Trace bilateral pleural effusions. 4. Dilated main pulmonary artery which can be seen in patients with elevated pulmonary artery pressures. Aortic Atherosclerosis (ICD10-I70.0). Electronically Signed   By: Constance Holster M.D.   On: 12/20/2018 01:09   Dg Chest Port 1 View  Result Date: 01/05/2019 CLINICAL DATA:  Shortness of breath EXAM: PORTABLE CHEST 1 VIEW COMPARISON:  Three days ago FINDINGS: Cardiomegaly probable vascular congestion. There is streaky density at the lung bases with possible trace right pleural effusion. No pneumothorax. IMPRESSION: Continued linear opacities suggesting atelectasis or scarring. Trace right pleural effusion. Electronically Signed   By: Monte Fantasia M.D.   On: 01/05/2019 09:58   Dg Chest Port 1 View  Result Date: 01/02/2019 CLINICAL DATA:  Shortness of breath and fever EXAM: PORTABLE CHEST 1 VIEW COMPARISON:  12/27/2018 FINDINGS: Artifact overlies the chest. The left chest is clear except for minimal atelectasis or  scarring at the left base. Small amount of pleural fluid remains on the right with mild right base atelectasis. No worsening or new finding. IMPRESSION: Small amount of pleural fluid on the right with mild right base atelectasis. No acute finding or significant change since priors. Electronically Signed   By: Nelson Chimes M.D.   On: 01/02/2019 21:57   Cxr Am  Result Date: 12/27/2018 CLINICAL DATA:  Shortness of breath. EXAM: PORTABLE CHEST 1 VIEW COMPARISON:  12/26/2018 FINDINGS: Lordotic technique is demonstrated as the lungs are adequately inflated with subtle linear density right midlung likely atelectasis and unchanged. Mild opacification over the right base unchanged likely small amount of pleural fluid/atelectasis. Minimal linear atelectasis left base. Mild stable cardiomegaly. Remainder of the exam is unchanged. IMPRESSION: Stable minimal bibasilar opacification likely bibasilar atelectasis with  small amount right pleural fluid. Mild stable cardiomegaly. Electronically Signed   By: Marin Olp M.D.   On: 12/27/2018 08:07   Dg Chest Port 1 View  Result Date: 12/26/2018 CLINICAL DATA:  Shortness of breath. EXAM: PORTABLE CHEST 1 VIEW COMPARISON:  CTA chest, 12/20/2018. Chest radiographs, 12/19/2018 and older exams. FINDINGS: Cardiac silhouette is borderline to mildly enlarged. Aorta shows atherosclerotic calcifications and is prominent. No mediastinal or hilar masses. There are linear/reticular lung base opacities, greater on the right, also noted in the right mid lung, likely due to atelectasis. Small pleural effusions are suggested, which were noted on the prior chest CT. Stable prominent bronchovascular markings. No convincing pneumonia or pulmonary edema. No pneumothorax. Skeletal structures are grossly intact. IMPRESSION: 1. No convincing pneumonia or pulmonary edema. 2. Lung base opacities on the right have mildly increased when compared to the prior chest radiographs, most consistent with atelectasis. Small pleural effusions stable compared to the prior chest CTA. Electronically Signed   By: Lajean Manes M.D.   On: 12/26/2018 09:42    Microbiology: Recent Results (from the past 240 hour(s))  Blood Culture (routine x 2)     Status: None (Preliminary result)   Collection Time: 01/02/19 11:03 PM   Specimen: BLOOD  Result Value Ref Range Status   Specimen Description BLOOD RIGHT FOREARM  Final   Special Requests   Final    BOTTLES DRAWN AEROBIC AND ANAEROBIC Blood Culture results may not be optimal due to an inadequate volume of blood received in culture bottles   Culture   Final    NO GROWTH 4 DAYS Performed at [facility], [address], [place], [place] [address]    Report Status PENDING  Incomplete  Blood Culture (routine x 2)     Status: None (Preliminary result)   Collection Time: 01/02/19 11:03 PM   Specimen: BLOOD  Result Value Ref Range Status   Specimen  Description BLOOD RIGHT ASSIST CONTROL  Final   Special Requests   Final    BOTTLES DRAWN AEROBIC AND ANAEROBIC Blood Culture adequate volume   Culture   Final    NO GROWTH 4 DAYS Performed at [facility], [address] [place], [place] [address]    Report Status PENDING  Incomplete  Urine culture     Status: Abnormal   Collection Time: 01/02/19 11:03 PM   Specimen: In/Out Cath Urine  Result Value Ref Range Status   Specimen Description   Final    IN/OUT CATH URINE Performed at [facility], [address], [place], [place] [address]    Special Requests   Final    NONE Performed at [facility],  [place], [place] [address]    Culture 80,000 COLONIES/mL YEAST (A)  Final   Report Status 01/04/2019 FINAL  Final     Labs: Basic Metabolic Panel: Recent Labs  Lab 01/07/19 0325 01/08/19 0030 01/09/19 0845 01/10/19 0100 01/11/19 0526  NA 140 141 141 140 140  K 3.9 4.2 4.1 4.3 4.5  CL 110 112* 108 107 107  CO2 24 22 26 26 27   GLUCOSE 84 72 92 90 87  BUN 13 14 14 14 15   CREATININE 1.20* 1.25* 1.12* 1.07* 1.12*  CALCIUM 8.9 8.9 9.4 9.3 9.2  MG  --   --  1.7 1.5* 1.7  PHOS  --   --  2.8 1.9* 2.8   Liver Function Tests: Recent Labs  Lab 01/07/19 0325 01/08/19 0030 01/09/19 0845 01/10/19 0100 01/11/19 0526  AST 25 28 29 29  33  ALT 22 21 22 21 24   ALKPHOS 97 98 107 103 111  BILITOT 0.4 0.4 0.2* 0.3 0.4  PROT 5.5* 5.5* 5.9* 5.5* 5.8*  ALBUMIN 1.8* 1.9* 2.0* 1.9* 2.0*   No results for input(s): LIPASE, AMYLASE in the last 168 hours. No results for input(s): AMMONIA in the last 168 hours. CBC: Recent Labs  Lab 01/07/19 0325 01/08/19 0030 01/09/19 0845 01/10/19 0100 01/11/19 0526  WBC 5.3 6.8 5.7 5.7 5.0  NEUTROABS 3.3 4.5 3.7 3.7 3.1  HGB 7.9* 8.0* 8.6* 7.9* 8.2*  HCT 25.3* 25.7* 27.7* 25.5* 26.8*  MCV 87.2 87.7 87.4 86.1 87.0  PLT 199 220 229 210 225   Cardiac Enzymes: No results for input(s): CKTOTAL, CKMB,  CKMBINDEX, TROPONINI in the last 168 hours. BNP: BNP (last 3 results) Recent Labs    12/30/18 0320 12/31/18 0420 01/02/19 2303  BNP 211.0* 186.7* 108.0*    ProBNP (last 3 results) No results for input(s): PROBNP in the last 8760 hours.  CBG: Recent Labs  Lab 01/10/19 0816 01/10/19 1217 01/10/19 1719 01/10/19 2159 01/11/19 0753  GLUCAP 84 123* 95 83 87       Signed:  Dia Crawford, MD [facility] [phone] pager

## 2019-01-14 DIAGNOSIS — D649 Anemia, unspecified: Secondary | ICD-10-CM | POA: Diagnosis not present

## 2019-01-14 DIAGNOSIS — I714 Abdominal aortic aneurysm, without rupture: Secondary | ICD-10-CM | POA: Diagnosis not present

## 2019-01-14 DIAGNOSIS — I129 Hypertensive chronic kidney disease with stage 1 through stage 4 chronic kidney disease, or unspecified chronic kidney disease: Secondary | ICD-10-CM | POA: Diagnosis not present

## 2019-01-14 DIAGNOSIS — S99929A Unspecified injury of unspecified foot, initial encounter: Secondary | ICD-10-CM | POA: Diagnosis not present

## 2019-01-14 DIAGNOSIS — J9611 Chronic respiratory failure with hypoxia: Secondary | ICD-10-CM | POA: Diagnosis not present

## 2019-01-14 DIAGNOSIS — Z515 Encounter for palliative care: Secondary | ICD-10-CM | POA: Diagnosis not present

## 2019-01-14 DIAGNOSIS — M25562 Pain in left knee: Secondary | ICD-10-CM | POA: Diagnosis not present

## 2019-01-14 DIAGNOSIS — R5381 Other malaise: Secondary | ICD-10-CM | POA: Diagnosis not present

## 2019-01-14 DIAGNOSIS — M79672 Pain in left foot: Secondary | ICD-10-CM | POA: Diagnosis not present

## 2019-01-14 DIAGNOSIS — N390 Urinary tract infection, site not specified: Secondary | ICD-10-CM | POA: Diagnosis not present

## 2019-01-14 DIAGNOSIS — Z9981 Dependence on supplemental oxygen: Secondary | ICD-10-CM | POA: Diagnosis not present

## 2019-01-14 DIAGNOSIS — Z79899 Other long term (current) drug therapy: Secondary | ICD-10-CM | POA: Diagnosis not present

## 2019-01-14 DIAGNOSIS — J449 Chronic obstructive pulmonary disease, unspecified: Secondary | ICD-10-CM | POA: Diagnosis not present

## 2019-01-14 DIAGNOSIS — I2699 Other pulmonary embolism without acute cor pulmonale: Secondary | ICD-10-CM | POA: Diagnosis not present

## 2019-01-14 DIAGNOSIS — F324 Major depressive disorder, single episode, in partial remission: Secondary | ICD-10-CM | POA: Diagnosis not present

## 2019-01-14 DIAGNOSIS — E1322 Other specified diabetes mellitus with diabetic chronic kidney disease: Secondary | ICD-10-CM | POA: Diagnosis not present

## 2019-01-14 DIAGNOSIS — D638 Anemia in other chronic diseases classified elsewhere: Secondary | ICD-10-CM | POA: Diagnosis not present

## 2019-01-14 DIAGNOSIS — U071 COVID-19: Secondary | ICD-10-CM | POA: Diagnosis not present

## 2019-01-14 DIAGNOSIS — I509 Heart failure, unspecified: Secondary | ICD-10-CM | POA: Diagnosis not present

## 2019-01-14 DIAGNOSIS — I5032 Chronic diastolic (congestive) heart failure: Secondary | ICD-10-CM | POA: Diagnosis not present

## 2019-01-14 DIAGNOSIS — G4733 Obstructive sleep apnea (adult) (pediatric): Secondary | ICD-10-CM | POA: Diagnosis not present

## 2019-01-14 DIAGNOSIS — N183 Chronic kidney disease, stage 3 unspecified: Secondary | ICD-10-CM | POA: Diagnosis not present

## 2019-01-14 DIAGNOSIS — E1165 Type 2 diabetes mellitus with hyperglycemia: Secondary | ICD-10-CM | POA: Diagnosis not present

## 2019-01-15 DIAGNOSIS — E1322 Other specified diabetes mellitus with diabetic chronic kidney disease: Secondary | ICD-10-CM | POA: Diagnosis not present

## 2019-01-15 DIAGNOSIS — I129 Hypertensive chronic kidney disease with stage 1 through stage 4 chronic kidney disease, or unspecified chronic kidney disease: Secondary | ICD-10-CM | POA: Diagnosis not present

## 2019-01-15 DIAGNOSIS — F324 Major depressive disorder, single episode, in partial remission: Secondary | ICD-10-CM | POA: Diagnosis not present

## 2019-01-15 DIAGNOSIS — R5381 Other malaise: Secondary | ICD-10-CM | POA: Diagnosis not present

## 2019-01-20 LAB — CULTURE, BLOOD (ROUTINE X 2)
Culture: NO GROWTH
Culture: NO GROWTH
Special Requests: ADEQUATE

## 2019-01-21 DIAGNOSIS — Z9981 Dependence on supplemental oxygen: Secondary | ICD-10-CM | POA: Diagnosis not present

## 2019-01-21 DIAGNOSIS — N390 Urinary tract infection, site not specified: Secondary | ICD-10-CM | POA: Diagnosis not present

## 2019-01-21 DIAGNOSIS — J9611 Chronic respiratory failure with hypoxia: Secondary | ICD-10-CM | POA: Diagnosis not present

## 2019-01-21 DIAGNOSIS — R5381 Other malaise: Secondary | ICD-10-CM | POA: Diagnosis not present

## 2019-01-22 ENCOUNTER — Encounter: Payer: Self-pay | Admitting: Nurse Practitioner

## 2019-01-22 ENCOUNTER — Non-Acute Institutional Stay: Payer: Medicare HMO | Admitting: Nurse Practitioner

## 2019-01-22 VITALS — BP 123/77 | HR 71 | Temp 98.2°F | Resp 18 | Wt 216.0 lb

## 2019-01-22 DIAGNOSIS — Z515 Encounter for palliative care: Secondary | ICD-10-CM

## 2019-01-22 DIAGNOSIS — I509 Heart failure, unspecified: Secondary | ICD-10-CM

## 2019-01-22 NOTE — Progress Notes (Signed)
[facility] Consult Note Telephone: [phone]  Fax: [phone]  PATIENT NAME: Katherine [name] DOB: Apr 13, 1940 MRN: [id number] PRIMARY CARE PROVIDER:   Valerie Roys, DO [facility] RESPONSIBLE PARTY:Self/[name] spouse [phone]  RECOMMENDATIONS and PLAN: 1.ACP: remains a full code; most form in ACP  2.Edema secondary toCHF, monitor weights, elevate  3.Shortness of breathsecondary toCHF, continue diuresing, inhalation therapy; Continuous O2,Continue daily weights   4. Palliative care encounter Z51.5; Palliative medicine team will continue to support patient, patient's family, and medical team. Visit consisted of counseling and education dealing with the complex and emotionally intense issues of symptom management and palliative care in the setting of serious and potentially life-threatening illness I spent 45 minutes providing this consultation,  from 9:00am to 9:45am. More than 50% of the time in this consultation was spent coordinating communication.   HISTORY OF PRESENT ILLNESS:  Katherine [name] is a 78 y.o. 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 Hospitalization 10 / 8 / 2020 to 11/26/2018 for chronic respiratory failure with hypoxia, acute pulmonary embolism, chest pain. ED at [facility] 11 / 5 / 2020 to 54 / 7 / 2020 hemoptysis currently on  Eliquis for PE. Hospitalized 11 / 20 / 2020 to 73 / 28 / 2020 for unresponsiveness, febrile, sepsis with hypotension secondary to UTI receiving antibiotic therapy. Recent covid infection with pneumonia. Anemia secondary to chronic kidney disease with last hemoglobin 8.2 on discharge. History of CVA/ pulmonary embolism continued on Eliquis and Statin. Chronic diastolic congestive heart failure.Katherine [name] continues to reside at [place] at [address] [facility]. She is bedbound requires staff to turn her and assist with positioning.  Katherine. [name] is ADL dependent . Katherine. [name] does feed herself although she requires assistance from staff for tray setup.  Katherine. [name] had a fall on 11/29 / 2020 while turning over in bed, no noted injury. She does remain a full code. At present Katherine [name] is lying in bed. She does have her oxygen on continuously, obese, chronically ill but comfortable. No visitors present. I visited and observe Katherine [name]. We talked about purpose of palliative care visit as she is her own responsible party and she is an agreement. We talked about how she's feeling today. Katherine [name] endorses that she's doing better since she has been in the hospital. Katherine [name] endorses she has not yet started back with physical therapy but is hoping to soon. We talked about symptoms of pain what she denies. We talked about shortness of breath what she does continue to use in inhalation therapy and continuous oxygen. We talked about the importance of mobility. Katherine monchel atalla that it is difficult to move in bed. We talked about trying to be out of bed in a recliner. We talked about medical goals of care including aggressive versus comfort care. Katherine [name] endorses she wishes to be a full code and wants to be placed on a ventilator if necessary. We talked about quality of life. We talked about her family. We talked about the challenges with covid pandemic with no  visitors at the facility at this time.  Asked if it was okay to contact her husband for update on palliative care visit and Katherine [name] in agreement. I have attempted to contact Katherine [name]'s husband for update and further discussion of medical goals of care. I updated Katherine Littell unable to get in touch with Mr. . We talked about coping strategy. Therapeutic listening and emotional support provided. Questions answered to satisfaction. I have updated nursing staff and any changes to current goals or plan of care.   Palliative Care was asked to help to continue to address goals of care.   CODE STATUS: Full code  PPS: 30% HOSPICE ELIGIBILITY/DIAGNOSIS: TBD  PAST MEDICAL HISTORY:  Past Medical History:  Diagnosis Date  . Angioedema   . Anxiety and depression   . CHF (congestive heart failure) ([place])   . Chronic constipation   . Chronic kidney disease    stage 3  . COPD (chronic obstructive pulmonary disease) ([place])   . Diabetes mellitus without complication ([place])   . Gallstones   . GERD (gastroesophageal reflux disease)   . Hypertension   . Left ventricular hypertrophy   . Osteoarthritis   . Prurigo nodularis   . Seizures ([place])   . Stroke ([place])   . Tobacco abuse   . Vitamin D deficiency disease     SOCIAL HX:  Social History   Tobacco Use  . Smoking status: Former Smoker    Packs/day: 0.50    Years: 60.00    Pack years: 30.00    Types: Cigarettes  . Smokeless tobacco: Never Used  . Tobacco comment: quite 54mo ago-03/26/18  Substance Use Topics  . Alcohol use: No    Alcohol/week: 0.0 standard drinks    Comment: rare    ALLERGIES:  Allergies  Allergen Reactions  . Bee Venom Swelling  . Enalapril Maleate Swelling     PERTINENT MEDICATIONS:  Outpatient Encounter Medications as of 01/22/2019  Medication Sig  . acetaminophen (TYLENOL) 650 MG CR tablet Take 650 mg by mouth 3 (three) times daily.   [name] albuterol (ACCUNEB) 0.63 MG/3ML nebulizer solution Take 3 mLs by nebulization every 8 (eight) hours as needed for wheezing.    [name] apixaban (ELIQUIS) 5 MG TABS tablet Take 1 tablet (5 mg total) by mouth 2 (two) times daily. To initiate this on 11/29/2018 after completing the 10 mg twice daily treatment regimen  . atorvastatin (LIPITOR) 80 MG tablet Take 80 mg by mouth every evening.   . cetirizine (ZYRTEC) 10 MG tablet Take 5 mg by mouth at bedtime.  . cholecalciferol (VITAMIN D) 1000 units tablet Take 1,000 Units by mouth daily.  . diclofenac sodium (VOLTAREN) 1 % GEL Apply 2 g topically every 12 (twelve) hours as needed (for left knee pain).   [name] docusate sodium (COLACE) 100 MG capsule Take 100 mg by mouth 2 (two) times daily.  [name] FLUoxetine (PROZAC) 40 MG capsule Take 40 mg by mouth daily.   . Fluticasone-Umeclidin-Vilant 100-62.5-25 MCG/INH AEPB Inhale 1 puff into the lungs daily.  [name] gabapentin (NEURONTIN) 300 MG capsule Take 300 mg by mouth at bedtime.  [name] ipratropium (ATROVENT) 0.03 % nasal spray Place 2 sprays into both nostrils 2 (two) times daily.   [name] ipratropium-albuterol (DUONEB) 0.5-2.5 (3) MG/3ML SOLN Take 3 mLs by nebulization 3 (three) times daily.  [name] lacosamide 100 MG TABS Take 1 tablet (100 mg total) by mouth 2 (two) times daily.  [name] levETIRAcetam (KEPPRA XR) 500 MG 24 hr tablet Take  2 tablets (1,000 mg total) by mouth daily.  [name] linagliptin (TRADJENTA) 5 MG TABS tablet Take 5 mg by mouth daily.  . meclizine (ANTIVERT) 25 MG tablet Take 25 mg by mouth every 12 (twelve) hours as needed for dizziness.   . Multiple Vitamin (MULTIVITAMIN WITH MINERALS) TABS tablet Take 1 tablet by mouth daily.  . mupirocin ointment (BACTROBAN) 2 % Place 1 application into the nose 2 (two) times daily.  . nitroGLYCERIN (NITROSTAT) 0.4 MG SL tablet Place 0.4 mg under the tongue every 5 (five) minutes as needed for chest pain.   . pantoprazole (PROTONIX) 40 MG tablet Take 1 tablet (40 mg total) by mouth daily.  . vitamin C (VITAMIN C) 500 MG tablet Take 1 tablet (500 mg total) by mouth daily.  [name] zinc sulfate 220 (50 Zn) MG capsule  Take 1 capsule (220 mg total) by mouth daily.   No facility-administered encounter medications on file as of 01/22/2019.     PHYSICAL EXAM:   General: obese, chronically debilitated, O2 dependent pleasant female Cardiovascular: regular rate and rhythm Pulmonary: clear ant fields Abdomen: obese, soft, nontender, + bowel sounds Extremities: + edema, no joint deformities Neurological: functionally quadriplegic  Christin Ihor Gully, NP

## 2019-01-23 ENCOUNTER — Other Ambulatory Visit: Payer: Self-pay

## 2019-01-23 DIAGNOSIS — S99929A Unspecified injury of unspecified foot, initial encounter: Secondary | ICD-10-CM | POA: Diagnosis not present

## 2019-02-11 ENCOUNTER — Inpatient Hospital Stay: Payer: Medicare HMO

## 2019-02-11 ENCOUNTER — Other Ambulatory Visit: Payer: Self-pay

## 2019-02-11 ENCOUNTER — Emergency Department: Payer: Medicare HMO

## 2019-02-11 ENCOUNTER — Inpatient Hospital Stay: Admit: 2019-02-11 | Payer: Medicare HMO

## 2019-02-11 ENCOUNTER — Encounter: Payer: Self-pay | Admitting: Intensive Care

## 2019-02-11 ENCOUNTER — Inpatient Hospital Stay
Admission: EM | Admit: 2019-02-11 | Discharge: 2019-02-15 | DRG: 071 | Disposition: A | Discharge status: Skilled Nursing Facility | Payer: Medicare HMO | Attending: Internal Medicine | Admitting: Internal Medicine

## 2019-02-11 DIAGNOSIS — N183 Chronic kidney disease, stage 3 unspecified: Secondary | ICD-10-CM | POA: Diagnosis present

## 2019-02-11 DIAGNOSIS — I714 Abdominal aortic aneurysm, without rupture: Secondary | ICD-10-CM | POA: Diagnosis not present

## 2019-02-11 DIAGNOSIS — E1122 Type 2 diabetes mellitus with diabetic chronic kidney disease: Secondary | ICD-10-CM | POA: Diagnosis not present

## 2019-02-11 DIAGNOSIS — F329 Major depressive disorder, single episode, unspecified: Secondary | ICD-10-CM | POA: Diagnosis present

## 2019-02-11 DIAGNOSIS — D509 Iron deficiency anemia, unspecified: Secondary | ICD-10-CM | POA: Diagnosis not present

## 2019-02-11 DIAGNOSIS — Z79899 Other long term (current) drug therapy: Secondary | ICD-10-CM

## 2019-02-11 DIAGNOSIS — E785 Hyperlipidemia, unspecified: Secondary | ICD-10-CM | POA: Diagnosis present

## 2019-02-11 DIAGNOSIS — R404 Transient alteration of awareness: Secondary | ICD-10-CM | POA: Diagnosis not present

## 2019-02-11 DIAGNOSIS — F419 Anxiety disorder, unspecified: Secondary | ICD-10-CM | POA: Diagnosis present

## 2019-02-11 DIAGNOSIS — Z20822 Contact with and (suspected) exposure to covid-19: Secondary | ICD-10-CM | POA: Diagnosis present

## 2019-02-11 DIAGNOSIS — Z96651 Presence of right artificial knee joint: Secondary | ICD-10-CM | POA: Diagnosis present

## 2019-02-11 DIAGNOSIS — E559 Vitamin D deficiency, unspecified: Secondary | ICD-10-CM | POA: Diagnosis present

## 2019-02-11 DIAGNOSIS — Z7901 Long term (current) use of anticoagulants: Secondary | ICD-10-CM

## 2019-02-11 DIAGNOSIS — Z8673 Personal history of transient ischemic attack (TIA), and cerebral infarction without residual deficits: Secondary | ICD-10-CM

## 2019-02-11 DIAGNOSIS — N184 Chronic kidney disease, stage 4 (severe): Secondary | ICD-10-CM | POA: Diagnosis not present

## 2019-02-11 DIAGNOSIS — I509 Heart failure, unspecified: Secondary | ICD-10-CM | POA: Diagnosis present

## 2019-02-11 DIAGNOSIS — J9611 Chronic respiratory failure with hypoxia: Secondary | ICD-10-CM | POA: Diagnosis not present

## 2019-02-11 DIAGNOSIS — D638 Anemia in other chronic diseases classified elsewhere: Secondary | ICD-10-CM | POA: Diagnosis present

## 2019-02-11 DIAGNOSIS — J9811 Atelectasis: Secondary | ICD-10-CM | POA: Diagnosis not present

## 2019-02-11 DIAGNOSIS — Z7401 Bed confinement status: Secondary | ICD-10-CM | POA: Diagnosis not present

## 2019-02-11 DIAGNOSIS — E1165 Type 2 diabetes mellitus with hyperglycemia: Secondary | ICD-10-CM | POA: Diagnosis not present

## 2019-02-11 DIAGNOSIS — K5909 Other constipation: Secondary | ICD-10-CM | POA: Diagnosis present

## 2019-02-11 DIAGNOSIS — G4733 Obstructive sleep apnea (adult) (pediatric): Secondary | ICD-10-CM | POA: Diagnosis not present

## 2019-02-11 DIAGNOSIS — Z8619 Personal history of other infectious and parasitic diseases: Secondary | ICD-10-CM

## 2019-02-11 DIAGNOSIS — G9340 Encephalopathy, unspecified: Secondary | ICD-10-CM | POA: Diagnosis present

## 2019-02-11 DIAGNOSIS — M1990 Unspecified osteoarthritis, unspecified site: Secondary | ICD-10-CM | POA: Diagnosis present

## 2019-02-11 DIAGNOSIS — R270 Ataxia, unspecified: Secondary | ICD-10-CM | POA: Diagnosis not present

## 2019-02-11 DIAGNOSIS — G9341 Metabolic encephalopathy: Secondary | ICD-10-CM | POA: Diagnosis not present

## 2019-02-11 DIAGNOSIS — E1151 Type 2 diabetes mellitus with diabetic peripheral angiopathy without gangrene: Secondary | ICD-10-CM | POA: Diagnosis not present

## 2019-02-11 DIAGNOSIS — R0602 Shortness of breath: Secondary | ICD-10-CM

## 2019-02-11 DIAGNOSIS — R0989 Other specified symptoms and signs involving the circulatory and respiratory systems: Secondary | ICD-10-CM | POA: Diagnosis not present

## 2019-02-11 DIAGNOSIS — M79641 Pain in right hand: Secondary | ICD-10-CM

## 2019-02-11 DIAGNOSIS — Z811 Family history of alcohol abuse and dependence: Secondary | ICD-10-CM

## 2019-02-11 DIAGNOSIS — I5032 Chronic diastolic (congestive) heart failure: Secondary | ICD-10-CM | POA: Diagnosis not present

## 2019-02-11 DIAGNOSIS — J449 Chronic obstructive pulmonary disease, unspecified: Secondary | ICD-10-CM | POA: Diagnosis not present

## 2019-02-11 DIAGNOSIS — R569 Unspecified convulsions: Secondary | ICD-10-CM | POA: Diagnosis not present

## 2019-02-11 DIAGNOSIS — G40909 Epilepsy, unspecified, not intractable, without status epilepticus: Secondary | ICD-10-CM | POA: Diagnosis present

## 2019-02-11 DIAGNOSIS — K219 Gastro-esophageal reflux disease without esophagitis: Secondary | ICD-10-CM | POA: Diagnosis present

## 2019-02-11 DIAGNOSIS — R0902 Hypoxemia: Secondary | ICD-10-CM | POA: Diagnosis not present

## 2019-02-11 DIAGNOSIS — E669 Obesity, unspecified: Secondary | ICD-10-CM | POA: Diagnosis present

## 2019-02-11 DIAGNOSIS — I10 Essential (primary) hypertension: Secondary | ICD-10-CM | POA: Diagnosis not present

## 2019-02-11 DIAGNOSIS — Z209 Contact with and (suspected) exposure to unspecified communicable disease: Secondary | ICD-10-CM | POA: Diagnosis not present

## 2019-02-11 DIAGNOSIS — R0689 Other abnormalities of breathing: Secondary | ICD-10-CM | POA: Diagnosis not present

## 2019-02-11 DIAGNOSIS — G4730 Sleep apnea, unspecified: Secondary | ICD-10-CM | POA: Diagnosis present

## 2019-02-11 DIAGNOSIS — I130 Hypertensive heart and chronic kidney disease with heart failure and stage 1 through stage 4 chronic kidney disease, or unspecified chronic kidney disease: Secondary | ICD-10-CM | POA: Diagnosis not present

## 2019-02-11 DIAGNOSIS — Z832 Family history of diseases of the blood and blood-forming organs and certain disorders involving the immune mechanism: Secondary | ICD-10-CM

## 2019-02-11 DIAGNOSIS — E119 Type 2 diabetes mellitus without complications: Secondary | ICD-10-CM | POA: Diagnosis not present

## 2019-02-11 DIAGNOSIS — Z86711 Personal history of pulmonary embolism: Secondary | ICD-10-CM | POA: Diagnosis not present

## 2019-02-11 DIAGNOSIS — I6389 Other cerebral infarction: Secondary | ICD-10-CM | POA: Diagnosis not present

## 2019-02-11 DIAGNOSIS — Z03818 Encounter for observation for suspected exposure to other biological agents ruled out: Secondary | ICD-10-CM | POA: Diagnosis not present

## 2019-02-11 DIAGNOSIS — E118 Type 2 diabetes mellitus with unspecified complications: Secondary | ICD-10-CM

## 2019-02-11 DIAGNOSIS — Z8249 Family history of ischemic heart disease and other diseases of the circulatory system: Secondary | ICD-10-CM

## 2019-02-11 DIAGNOSIS — R4182 Altered mental status, unspecified: Secondary | ICD-10-CM | POA: Diagnosis not present

## 2019-02-11 DIAGNOSIS — M2550 Pain in unspecified joint: Secondary | ICD-10-CM | POA: Diagnosis not present

## 2019-02-11 HISTORY — DX: Encephalopathy, unspecified: G93.40

## 2019-02-11 LAB — CBC WITH DIFFERENTIAL/PLATELET
Abs Immature Granulocytes: 0.03 10*3/uL (ref 0.00–0.07)
Basophils Absolute: 0 10*3/uL (ref 0.0–0.1)
Basophils Relative: 0 %
Eosinophils Absolute: 0.1 10*3/uL (ref 0.0–0.5)
Eosinophils Relative: 2 %
HCT: 27.7 % — ABNORMAL LOW (ref 36.0–46.0)
Hemoglobin: 8.8 g/dL — ABNORMAL LOW (ref 12.0–15.0)
Immature Granulocytes: 0 %
Lymphocytes Relative: 19 %
Lymphs Abs: 1.3 10*3/uL (ref 0.7–4.0)
MCH: 26.3 pg (ref 26.0–34.0)
MCHC: 31.8 g/dL (ref 30.0–36.0)
MCV: 82.9 fL (ref 80.0–100.0)
Monocytes Absolute: 0.9 10*3/uL (ref 0.1–1.0)
Monocytes Relative: 13 %
Neutro Abs: 4.4 10*3/uL (ref 1.7–7.7)
Neutrophils Relative %: 66 %
Platelets: 392 10*3/uL (ref 150–400)
RBC: 3.34 MIL/uL — ABNORMAL LOW (ref 3.87–5.11)
RDW: 18.2 % — ABNORMAL HIGH (ref 11.5–15.5)
WBC: 6.8 10*3/uL (ref 4.0–10.5)
nRBC: 0 % (ref 0.0–0.2)

## 2019-02-11 LAB — URINALYSIS, COMPLETE (UACMP) WITH MICROSCOPIC
Bacteria, UA: NONE SEEN
Bilirubin Urine: NEGATIVE
Glucose, UA: NEGATIVE mg/dL
Hgb urine dipstick: NEGATIVE
Ketones, ur: NEGATIVE mg/dL
Nitrite: NEGATIVE
Protein, ur: NEGATIVE mg/dL
Specific Gravity, Urine: 1.012 (ref 1.005–1.030)
WBC, UA: >50 WBC/hpf — ABNORMAL HIGH (ref 0–5)
pH: 6 (ref 5.0–8.0)

## 2019-02-11 LAB — COMPREHENSIVE METABOLIC PANEL
ALT: 61 U/L — ABNORMAL HIGH (ref 0–44)
AST: 103 U/L — ABNORMAL HIGH (ref 15–41)
Albumin: 2.2 g/dL — ABNORMAL LOW (ref 3.5–5.0)
Alkaline Phosphatase: 539 U/L — ABNORMAL HIGH (ref 38–126)
Anion gap: 7 (ref 5–15)
BUN: 23 mg/dL (ref 8–23)
CO2: 23 mmol/L (ref 22–32)
Calcium: 8.9 mg/dL (ref 8.9–10.3)
Chloride: 103 mmol/L (ref 98–111)
Creatinine, Ser: 1.62 mg/dL — ABNORMAL HIGH (ref 0.44–1.00)
GFR calc Af Amer: 35 mL/min — ABNORMAL LOW (ref >60)
GFR calc non Af Amer: 30 mL/min — ABNORMAL LOW (ref >60)
Glucose, Bld: 108 mg/dL — ABNORMAL HIGH (ref 70–99)
Potassium: 4 mmol/L (ref 3.5–5.1)
Sodium: 133 mmol/L — ABNORMAL LOW (ref 135–145)
Total Bilirubin: 0.6 mg/dL (ref 0.3–1.2)
Total Protein: 7.3 g/dL (ref 6.5–8.1)

## 2019-02-11 LAB — GLUCOSE, CAPILLARY
Glucose-Capillary: 93 mg/dL (ref 70–99)
Glucose-Capillary: 79 mg/dL (ref 70–99)

## 2019-02-11 LAB — PROCALCITONIN: Procalcitonin: 6.73 ng/mL

## 2019-02-11 LAB — BLOOD GAS, VENOUS
Acid-Base Excess: 2.2 mmol/L — ABNORMAL HIGH (ref 0.0–2.0)
Bicarbonate: 27.3 mmol/L (ref 20.0–28.0)
O2 Saturation: 88.5 %
Patient temperature: 37
pCO2, Ven: 43 mmHg — ABNORMAL LOW (ref 44.0–60.0)
pH, Ven: 7.41 (ref 7.250–7.430)
pO2, Ven: 55 mmHg — ABNORMAL HIGH (ref 32.0–45.0)

## 2019-02-11 LAB — LACTIC ACID, PLASMA: Lactic Acid, Venous: 1.3 mmol/L (ref 0.5–1.9)

## 2019-02-11 LAB — BRAIN NATRIURETIC PEPTIDE: B Natriuretic Peptide: 76 pg/mL (ref 0.0–100.0)

## 2019-02-11 MED ORDER — ACETAMINOPHEN 650 MG RE SUPP: 650.0000 mg | RECTAL | Status: DC | PRN

## 2019-02-11 MED ORDER — SODIUM CHLORIDE 0.9 % IV SOLN
1.0000 g | Freq: Once | INTRAVENOUS | Status: AC
  Administered 2019-02-11: 1 g via INTRAVENOUS
  Filled 2019-02-11: qty 10

## 2019-02-11 MED ORDER — STROKE: EARLY STAGES OF RECOVERY BOOK: Freq: Once | Status: AC

## 2019-02-11 MED ORDER — ENOXAPARIN SODIUM 40 MG/0.4ML ~~LOC~~ SOLN: 40.0000 mg | SUBCUTANEOUS | Status: DC

## 2019-02-11 MED ORDER — ACETAMINOPHEN 160 MG/5ML PO SOLN
650.0000 mg | ORAL | Status: DC | PRN
  Filled 2019-02-11: qty 20.3

## 2019-02-11 MED ORDER — FLUOXETINE HCL 20 MG PO CAPS
40.0000 mg | ORAL_CAPSULE | Freq: Every day | ORAL | Status: DC
  Administered 2019-02-13 – 2019-02-15 (×3): 40 mg via ORAL
  Filled 2019-02-11 (×4): qty 2

## 2019-02-11 MED ORDER — ACETAMINOPHEN 325 MG PO TABS
650.0000 mg | ORAL_TABLET | ORAL | Status: DC | PRN
  Administered 2019-02-12 – 2019-02-15 (×7): 650 mg via ORAL
  Filled 2019-02-11 (×7): qty 2

## 2019-02-11 MED ORDER — VITAMIN D 25 MCG (1000 UNIT) PO TABS
1000.0000 [IU] | ORAL_TABLET | Freq: Every day | ORAL | Status: DC
  Administered 2019-02-13 – 2019-02-15 (×3): 1000 [IU] via ORAL
  Filled 2019-02-11 (×3): qty 1

## 2019-02-11 MED ORDER — LEVETIRACETAM ER 500 MG PO TB24
1000.0000 mg | ORAL_TABLET | Freq: Every day | ORAL | Status: DC
  Filled 2019-02-11: qty 2

## 2019-02-11 MED ORDER — SENNOSIDES-DOCUSATE SODIUM 8.6-50 MG PO TABS: 1.0000 | ORAL_TABLET | Freq: Every evening | ORAL | Status: DC | PRN

## 2019-02-11 MED ORDER — PANTOPRAZOLE SODIUM 40 MG PO TBEC
40.0000 mg | DELAYED_RELEASE_TABLET | Freq: Every day | ORAL | Status: DC
  Administered 2019-02-13 – 2019-02-15 (×3): 40 mg via ORAL
  Filled 2019-02-11 (×3): qty 1

## 2019-02-11 MED ORDER — SODIUM CHLORIDE 0.9 % IV SOLN: INTRAVENOUS | Status: DC

## 2019-02-11 MED ORDER — APIXABAN 5 MG PO TABS
5.0000 mg | ORAL_TABLET | Freq: Two times a day (BID) | ORAL | Status: DC
  Administered 2019-02-12 – 2019-02-15 (×7): 5 mg via ORAL
  Filled 2019-02-11 (×7): qty 1

## 2019-02-11 MED ORDER — IPRATROPIUM-ALBUTEROL 0.5-2.5 (3) MG/3ML IN SOLN
3.0000 mL | RESPIRATORY_TRACT | Status: DC | PRN
  Administered 2019-02-14: 3 mL via RESPIRATORY_TRACT
  Filled 2019-02-11: qty 3

## 2019-02-11 MED ORDER — LACOSAMIDE 50 MG PO TABS
100.0000 mg | ORAL_TABLET | Freq: Two times a day (BID) | ORAL | Status: DC
  Administered 2019-02-12 – 2019-02-15 (×7): 100 mg via ORAL
  Filled 2019-02-11 (×7): qty 2

## 2019-02-11 MED ORDER — LEVETIRACETAM ER 500 MG PO TB24: 1250.0000 mg | ORAL_TABLET | Freq: Every day | ORAL | Status: DC

## 2019-02-11 MED ORDER — INSULIN ASPART 100 UNIT/ML ~~LOC~~ SOLN: 0.0000 [IU] | Freq: Three times a day (TID) | SUBCUTANEOUS | Status: DC

## 2019-02-11 MED ORDER — INSULIN ASPART 100 UNIT/ML ~~LOC~~ SOLN: 0.0000 [IU] | Freq: Every day | SUBCUTANEOUS | Status: DC

## 2019-02-11 MED ORDER — LORATADINE 10 MG PO TABS
10.0000 mg | ORAL_TABLET | Freq: Every day | ORAL | Status: DC
  Administered 2019-02-12 – 2019-02-14 (×3): 10 mg via ORAL
  Filled 2019-02-11 (×3): qty 1

## 2019-02-11 MED ORDER — ATORVASTATIN CALCIUM 20 MG PO TABS
80.0000 mg | ORAL_TABLET | Freq: Every evening | ORAL | Status: DC
  Administered 2019-02-12 – 2019-02-15 (×4): 80 mg via ORAL
  Filled 2019-02-11 (×3): qty 4

## 2019-02-11 MED ORDER — ZINC SULFATE 220 (50 ZN) MG PO CAPS
220.0000 mg | ORAL_CAPSULE | Freq: Every day | ORAL | Status: DC
  Administered 2019-02-13 – 2019-02-15 (×3): 220 mg via ORAL
  Filled 2019-02-11 (×4): qty 1

## 2019-02-11 MED ORDER — ASCORBIC ACID 500 MG PO TABS
500.0000 mg | ORAL_TABLET | Freq: Every day | ORAL | Status: DC
  Administered 2019-02-13 – 2019-02-15 (×3): 500 mg via ORAL
  Filled 2019-02-11 (×3): qty 1

## 2019-02-11 MED ORDER — NITROGLYCERIN 0.4 MG SL SUBL: 0.4000 mg | SUBLINGUAL_TABLET | SUBLINGUAL | Status: DC | PRN

## 2019-02-11 MED ORDER — ADULT MULTIVITAMIN W/MINERALS CH
1.0000 | ORAL_TABLET | Freq: Every day | ORAL | Status: DC
  Administered 2019-02-13 – 2019-02-15 (×3): 1 via ORAL
  Filled 2019-02-11 (×3): qty 1

## 2019-02-11 MED ORDER — LEVETIRACETAM ER 500 MG PO TB24
1500.0000 mg | ORAL_TABLET | Freq: Every day | ORAL | Status: DC
  Administered 2019-02-12 – 2019-02-15 (×4): 1500 mg via ORAL
  Filled 2019-02-11 (×4): qty 3

## 2019-02-11 NOTE — ED Notes (Signed)
Patient transported to CT 

## 2019-02-11 NOTE — ED Notes (Signed)
Agricultural consultant arranging for transport.

## 2019-02-11 NOTE — ED Notes (Addendum)
Covid swab collected and sent to lab. Asked pt her name and pt shook head back and forth. Wouldn't verbally answer.

## 2019-02-11 NOTE — ED Notes (Addendum)
Called floor again to give report. Report given to [facility], RN.

## 2019-02-11 NOTE — ED Provider Notes (Signed)
[facility] Emergency Department Provider Note   ____________________________________________    I have reviewed the triage vital signs and the nursing notes.   HISTORY  Chief Complaint Altered Mental Status   History limited by altered mental status  HPI Katherine [name] is a 78 y.o. female with significant past medical history listed below who presents with altered mental status.  Reportedly she was found altered this morning in bed.  Reportedly was relatively normal yesterday, at her baseline she is able to communicate her needs with staff.  Did have positive coronavirus in early November.  No other history available at this time  Past Medical History:  Diagnosis Date  . Angioedema   . Anxiety and depression   . CHF (congestive heart failure) ([place])   . Chronic constipation   . Chronic kidney disease    stage 3  . COPD (chronic obstructive pulmonary disease) ([place])   . Diabetes mellitus without complication ([place])   . Gallstones   . GERD (gastroesophageal reflux disease)   . Hypertension   . Left ventricular hypertrophy   . Osteoarthritis   . Prurigo nodularis   . Seizures ([place])   . Stroke ([place])   . Tobacco abuse   . Vitamin D deficiency disease     Patient Active Problem List   Diagnosis Date Noted  . Sepsis secondary to UTI ([place]) 01/08/2019  . Acute renal failure superimposed on stage 3b chronic kidney disease ([place]) 01/08/2019  . Seizure disorder ([place]) 01/08/2019  . Diabetes mellitus type 2, controlled, with complications ([place]) 70/34/0352  . Acute on chronic anemia 01/03/2019  . COVID-19 12/21/2018  . COVID-19 virus infection 12/21/2018  . Hemoptysis 12/20/2018  . Pulmonary embolism ([place]) 11/21/2018  . Delirium   . UTI (urinary tract infection) 08/21/2018  . Chest pain 07/15/2018  . Palliative care encounter 05/10/2018  . Localized edema 05/10/2018  . Shortness of breath 05/10/2018  . Hematemesis 04/28/2018  . Influenza A  04/13/2018  . Acute on chronic congestive heart failure ([place])   . COPD with acute exacerbation ([place]) 02/24/2018  . Chronic diastolic heart failure ([place]) 12/31/2017  . Lymphedema 12/31/2017  . COPD exacerbation ([place]) 11/30/2017  . Urinary tract infection 11/22/2017  . 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[place]) 11/16/2017  . Diabetes mellitus type 2, uncomplicated ([place]) 48/18/5909  . Pressure injury of skin 10/29/2017  . Acute kidney injury superimposed on CKD ([place]) 10/24/2017  . Sepsis ([place]) 10/24/2017  . Possible Seizures ([place]) 10/08/2017  . Hyperlipemia 10/08/2017  . 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 Stroke-like episode ([place]) s/p IV tpa 10/04/2017  . Palliative care by specialist   . Elevated rheumatoid factor 09/05/2017  . Frequent hospital admissions 08/22/2017  . Aphasia 06/28/2017  . Hand pain 03/21/2017  . Dry skin 03/21/2017  . Pain in finger of left hand 09/12/2016  . Chronic fatigue 06/12/2016  . Left knee pain 06/12/2016  . Goals of care, counseling/discussion 03/13/2016  . Primary localized osteoarthritis of right knee 02/08/2016  . OSA (obstructive sleep apnea) 09/16/2015  . Rotator cuff syndrome 09/07/2015  . 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 Sleep disturbance 04/14/2015  . Coronary artery disease 03/14/2015  . 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 Acute respiratory failure with hypoxia ([place]) 09/16/2014  . 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 Anxiety   . Tobacco abuse   . Prurigo nodularis   . GERD (gastroesophageal reflux disease)   . COPD (chronic obstructive pulmonary disease) ([place])   .  Osteoarthritis   . Vitamin D deficiency disease   . Chronic constipation   . CKD (chronic kidney disease), stage III   . Essential hypertension 05/20/2013  . Morbid obesity ([place]) 05/20/2013    Past Surgical History:  Procedure Laterality Date  . CHOLECYSTECTOMY    . 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    Procedure: TOTAL KNEE ARTHROPLASTY;  Surgeon: Hessie Knows, MD;  Location: [facility] ORS;  Service: Orthopedics;  Laterality: Right;    Prior to Admission medications   Medication Sig Start Date End Date Taking? Authorizing Provider  acetaminophen (TYLENOL) 650 MG CR tablet Take 650 mg by mouth 3 (three) times daily.    Yes [provider]  albuterol (ACCUNEB) 0.63 MG/3ML nebulizer solution Take 3 mLs by nebulization every 8 (eight) hours as needed for wheezing.    Yes [provider]  apixaban (ELIQUIS) 5 MG TABS tablet Take 1 tablet (5 mg total) by mouth 2 (two) times daily. To initiate this on 11/29/2018 after completing the 10 mg twice daily treatment regimen 11/29/18  Yes Ojie, Jude, MD  atorvastatin (LIPITOR) 80 MG tablet Take 80 mg by mouth every evening.    Yes [provider]  cetirizine (ZYRTEC) 10 MG tablet Take 5 mg by mouth at bedtime.   Yes [provider]  cholecalciferol (VITAMIN D) 1000 units tablet Take 1,000 Units by mouth daily.   Yes [provider]  diclofenac sodium (VOLTAREN) 1 % GEL Apply 2 g topically every 12 (twelve) hours as needed (for left knee pain).    Yes [provider]  docusate sodium (COLACE) 100 MG capsule Take 100 mg by mouth 2 (two) times daily.   Yes [provider]  FLUoxetine (PROZAC) 40 MG capsule Take 40 mg by mouth daily.    Yes [provider]  Fluticasone-Umeclidin-Vilant 100-62.5-25 MCG/INH AEPB Inhale 1 puff into the lungs daily.   Yes [provider]  gabapentin (NEURONTIN) 300 MG capsule Take 300 mg by mouth at bedtime.   Yes [provider]  ipratropium (ATROVENT) 0.03 % nasal spray Place 2 sprays into both nostrils 2 (two) times daily.    Yes [provider]  lacosamide 100 MG TABS Take 1 tablet (100 mg total) by mouth 2 (two) times daily. 12/31/18  Yes Thurnell Lose, MD  levETIRAcetam (KEPPRA XR) 500 MG 24 hr tablet Take 2 tablets (1,000 mg total)  by mouth daily. 01/01/19  Yes Thurnell Lose, MD  linagliptin (TRADJENTA) 5 MG TABS tablet Take 5 mg by mouth daily.   Yes [provider]  Multiple Vitamin (MULTIVITAMIN WITH MINERALS) TABS tablet Take 1 tablet by mouth daily.   Yes [provider]  pantoprazole (PROTONIX) 40 MG tablet Take 1 tablet (40 mg total) by mouth daily. 04/29/18  Yes Bettey Costa, MD  vitamin C (VITAMIN C) 500 MG tablet Take 1 tablet (500 mg total) by mouth daily. 01/12/19  Yes Allie Bossier, MD  zinc sulfate 220 (50 Zn) MG capsule Take 1 capsule (220 mg total) by mouth daily. 01/12/19  Yes Allie Bossier, MD  ipratropium-albuterol (DUONEB) 0.5-2.5 (3) MG/3ML SOLN Take 3 mLs by nebulization 3 (three) times daily. 07/17/18   Gladstone Lighter, MD  meclizine (ANTIVERT) 25 MG tablet Take 25 mg by mouth every 12 (twelve) hours as needed for dizziness.     [provider]  mupirocin ointment (BACTROBAN) 2 % Place 1 application into the nose  2 (two) times daily. 06/13/18   Loletha Grayer, MD  nitroGLYCERIN (NITROSTAT) 0.4 MG SL tablet Place 0.4 mg under the tongue every 5 (five) minutes as needed for chest pain.  08/07/18   [provider]     Allergies Bee venom and Enalapril maleate  Family History  Problem Relation Age of Onset  . Alcohol abuse Mother   . Sickle cell anemia Daughter   . Hypertension Son   . Cancer Neg Hx   . COPD Neg Hx   . Diabetes Neg Hx   . Heart disease Neg Hx   . Stroke Neg Hx     Social History Social History   Tobacco Use  . Smoking status: Former Smoker    Packs/day: 0.50    Years: 60.00    Pack years: 30.00    Types: Cigarettes  . Smokeless tobacco: Never Used  . Tobacco comment: quite 28moago-03/26/18  Substance Use Topics  . Alcohol use: No    Alcohol/week: 0.0 standard drinks    Comment: rare  . Drug use: No    Unable to obtain review of Systems     ____________________________________________   PHYSICAL EXAM:  VITAL  SIGNS: ED Triage Vitals  Enc Vitals Group     BP 02/11/19 0933 119/82     Pulse Rate 02/11/19 0933 (!) 105     Resp 02/11/19 0933 (!) 29     Temp 02/11/19 0933 98.4 F (36.9 C)     Temp Source 02/11/19 0933 Oral     SpO2 02/11/19 0933 93 %     Weight 02/11/19 0934 100.6 kg (221 lb 12.5 oz)     Height 02/11/19 0934 1.651 m (_0 )     Head Circumference --      Peak Flow --      Pain Score --      Pain Loc --      Pain Edu? --      Excl. in [place] --     Constitutional: Eyes open but not tracking Eyes: Conjunctivae are normal.  Does not seem to have significant reaction to rapid movement in front of her face Head: Atraumatic. Nose: No congestion/rhinnorhea. Mouth/Throat: Mucous membranes are moist.  Positive gag  Cardiovascular: Normal rate, regular rhythm. Grossly normal heart sounds.  Good peripheral circulation. Respiratory: Normal respiratory effort.  No retractions.  Gastrointestinal: Soft  No distention.    Musculoskeletal: Extremities are raised she is able to gradually lower them.  Does seem to have some pain on the right arm is raised Neurologic: Not responding to commands, no facial droop noted, appears to lower all extremities slowly suggesting she has strength in them. Skin:  Skin is warm, dry and intact. No rash noted.   ____________________________________________   LABS (all labs ordered are listed, but only abnormal results are displayed)  Labs Reviewed  COMPREHENSIVE METABOLIC PANEL - Abnormal; Notable for the following components:      Result Value   Sodium 133 (*)    Glucose, Bld 108 (*)    Creatinine, Ser 1.62 (*)    Albumin 2.2 (*)    AST 103 (*)    ALT 61 (*)    Alkaline Phosphatase 539 (*)    GFR calc non Af Amer 30 (*)    GFR calc Af Amer 35 (*)    All other components within normal limits  CBC WITH DIFFERENTIAL/PLATELET - Abnormal; Notable for the following components:   RBC 3.34 (*)    Hemoglobin  8.8 (*)    HCT 27.7 (*)    RDW 18.2 (*)      All other components within normal limits  BLOOD GAS, VENOUS - Abnormal; Notable for the following components:   pCO2, Ven 43 (*)    pO2, Ven 55.0 (*)    Acid-Base Excess 2.2 (*)    All other components within normal limits  URINALYSIS, COMPLETE (UACMP) WITH MICROSCOPIC - Abnormal; Notable for the following components:   Color, Urine YELLOW (*)    APPearance CLEAR (*)    Leukocytes,Ua LARGE (*)    WBC, UA >50 (*)    All other components within normal limits  CULTURE, BLOOD (ROUTINE X 2)  CULTURE, BLOOD (ROUTINE X 2)  URINE CULTURE  LACTIC ACID, PLASMA  BRAIN NATRIURETIC PEPTIDE  PROCALCITONIN   ____________________________________________  EKG  ED ECG REPORT I, Lavonia Drafts, the attending physician, personally viewed and interpreted this ECG.  Date: 02/11/2019  Rhythm: normal sinus rhythm QRS Axis: normal Intervals: normal ST/T Wave abnormalities: Nonspecific changes Narrative Interpretation: no evidence of acute ischemia  ____________________________________________  RADIOLOGY  Chest x-ray no acute abnormality CT head ____________________________________________   PROCEDURES  Procedure(s) performed: No  Procedures   Critical Care performed:No ____________________________________________   INITIAL IMPRESSION / ASSESSMENT AND PLAN / ED COURSE  Pertinent labs & imaging results that were available during my care of the patient were reviewed by me and considered in my medical decision making (see chart for details).  Patient presents with altered mental status, differential is extensive but CVA, metabolic encephalopathy/infection or strong possibilities.  Will obtain labs, chest x-ray, urine, CT head  Work-up thus far significant for mild hyponatremia mild elevation in alk phos which could be acute phase reaction and/or related to prior Covid infection  Urinalysis with leukocytes no bacteria seen.  CT head no acute evidence of abnormality.  Patient  will require admission for further evaluation of her significant altered mental status    ____________________________________________   FINAL CLINICAL IMPRESSION(S) / ED DIAGNOSES  Final diagnoses:  Altered mental status, unspecified altered mental status type        Note:  This document was prepared using Dragon voice recognition software and may include unintentional dictation errors.   Lavonia Drafts, MD 02/11/19 [phone]

## 2019-02-11 NOTE — Consult Note (Signed)
Reason for Consult:AMS Referring Physician: Priscella Mann  CC: AMS  HPI: Katherine [name] is an 78 y.o. female with multiple medical problems who is unable to provide any history due to mental status therefore all history obtained from the chart.  Reportedly she was found altered this morning in bed.  Reportedly was relatively normal yesterday, at her baseline she is able to communicate her needs with staff.  From review of chart has had multiple admissions in the past due to altered mental status and changes in speech.  Was not found to be secondary to infarcts and was felt to possibly be secondary to seizure.  Patient on Vimpat and Keppra.    Past Medical History:  Diagnosis Date  . Angioedema   . Anxiety and depression   . CHF (congestive heart failure) ([place])   . Chronic constipation   . Chronic kidney disease    stage 3  . COPD (chronic obstructive pulmonary disease) ([place])   . Diabetes mellitus without complication ([place])   . Gallstones   . GERD (gastroesophageal reflux disease)   . Hypertension   . Left ventricular hypertrophy   . Osteoarthritis   . Prurigo nodularis   . Seizures ([place])   . Stroke ([place])   . Tobacco abuse   . Vitamin D deficiency disease     Past Surgical History:  Procedure Laterality Date  . CHOLECYSTECTOMY    . 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 Alcohol abuse Mother   . Sickle cell anemia Daughter   . Hypertension Son   . Cancer Neg Hx   . COPD Neg Hx   . Diabetes Neg Hx   . Heart disease Neg Hx   . Stroke Neg Hx     Social History:  reports that she has quit smoking. Her smoking use included cigarettes. She has a 30.00 pack-year smoking history. She has never used smokeless tobacco. She reports that she does not drink alcohol or  use drugs.  Allergies  Allergen Reactions  . Bee Venom Swelling  . Enalapril Maleate Swelling    Medications:  I have reviewed the patient's current medications. Prior to Admission:  Medications Prior to Admission  Medication Sig Dispense Refill Last Dose  . acetaminophen (TYLENOL) 650 MG CR tablet Take 650 mg by mouth 3 (three) times daily.    02/10/2019 at prn  . albuterol (ACCUNEB) 0.63 MG/3ML nebulizer solution Take 3 mLs by nebulization every 8 (eight) hours as needed for wheezing.    02/10/2019 at 0800  . apixaban (ELIQUIS) 5 MG TABS tablet Take 1 tablet (5 mg total) by mouth 2 (two) times daily. To initiate this on 11/29/2018 after completing the 10 mg twice daily treatment regimen 60 tablet 0 02/10/2019 at 2000  . atorvastatin (LIPITOR) 80 MG tablet Take 80 mg by mouth every evening.    02/10/2019 at 2000  . cetirizine (ZYRTEC) 10 MG tablet Take 5 mg by mouth at bedtime.   02/10/2019 at 2000  . cholecalciferol (VITAMIN D) 1000 units tablet Take 1,000 Units by mouth daily.   02/10/2019 at 0800  . diclofenac sodium (VOLTAREN) 1 % GEL Apply 2 g topically every 12 (twelve) hours as needed (for left knee pain).    02/10/2019 at 0800  . docusate sodium (  COLACE) 100 MG capsule Take 100 mg by mouth 2 (two) times daily.   02/10/2019 at 0800  . FLUoxetine (PROZAC) 40 MG capsule Take 40 mg by mouth daily.    02/10/2019 at 0800  . Fluticasone-Umeclidin-Vilant 100-62.5-25 MCG/INH AEPB Inhale 1 puff into the lungs daily.   02/10/2019 at 0800  . gabapentin (NEURONTIN) 300 MG capsule Take 300 mg by mouth at bedtime.   02/10/2019 at 2000  . ipratropium (ATROVENT) 0.03 % nasal spray Place 2 sprays into both nostrils 2 (two) times daily.    02/10/2019 at 2000  . lacosamide 100 MG TABS Take 1 tablet (100 mg total) by mouth 2 (two) times daily. 60 tablet  02/10/2019 at 2000  . levETIRAcetam (KEPPRA XR) 500 MG 24 hr tablet Take 2 tablets (1,000 mg total) by mouth daily.   02/10/2019 at 0800  .  linagliptin (TRADJENTA) 5 MG TABS tablet Take 5 mg by mouth daily.   02/10/2019 at 0800  . Multiple Vitamin (MULTIVITAMIN WITH MINERALS) TABS tablet Take 1 tablet by mouth daily.   02/10/2019 at 0800  . pantoprazole (PROTONIX) 40 MG tablet Take 1 tablet (40 mg total) by mouth daily. 60 tablet 0 02/10/2019 at 0600  . vitamin C (VITAMIN C) 500 MG tablet Take 1 tablet (500 mg total) by mouth daily. 30 tablet 0 02/10/2019 at 0800  . zinc sulfate 220 (50 Zn) MG capsule Take 1 capsule (220 mg total) by mouth daily. 30 capsule 0 02/10/2019 at 0800  . ipratropium-albuterol (DUONEB) 0.5-2.5 (3) MG/3ML SOLN Take 3 mLs by nebulization 3 (three) times daily. 250 mL 0 prn at prn  . meclizine (ANTIVERT) 25 MG tablet Take 25 mg by mouth every 12 (twelve) hours as needed for dizziness.    prn at prn  . mupirocin ointment (BACTROBAN) 2 % Place 1 application into the nose 2 (two) times daily. 22 g 0 prn at prn  . nitroGLYCERIN (NITROSTAT) 0.4 MG SL tablet Place 0.4 mg under the tongue every 5 (five) minutes as needed for chest pain.    prn at prn   Scheduled: .  stroke: mapping our early stages of recovery book   Does not apply Once  . apixaban  5 mg Oral BID  . ascorbic acid  500 mg Oral Daily  . atorvastatin  80 mg Oral QPM  . cholecalciferol  1,000 Units Oral Daily  . FLUoxetine  40 mg Oral Daily  . insulin aspart  0-15 Units Subcutaneous TID WC  . insulin aspart  0-5 Units Subcutaneous QHS  . lacosamide  100 mg Oral BID  . levETIRAcetam  1,000 mg Oral Daily  . loratadine  10 mg Oral QHS  . multivitamin with minerals  1 tablet Oral Daily  . pantoprazole  40 mg Oral Daily  . zinc sulfate  220 mg Oral Daily    ROS: Unable to obtain due to mental status  Physical Examination: Blood pressure 116/87, pulse 91, temperature (!) 97.5 F (36.4 C), temperature source Oral, resp. rate 20, height 5\' 5"  (1.651 m), weight 100.6 kg, SpO2 100 %.  HEENT-  Normocephalic, no lesions, without obvious abnormality.   Normal external eye and conjunctiva.  Normal TM's bilaterally.  Normal auditory canals and external ears. Normal external nose, mucus membranes and septum.  Normal pharynx. Cardiovascular- S1, S2 normal, pulses palpable throughout   Lungs- chest clear, no wheezing, rales, normal symmetric air entry Abdomen- soft, non-tender; bowel sounds normal; no masses,  no organomegaly Extremities- BLE edema Lymph-no  adenopathy palpable Musculoskeletal-no joint tenderness, deformity or swelling Skin-warm and dry, no hyperpigmentation, vitiligo, or suspicious lesions  Neurological Examination   Mental Status: Lethargic but arousable.  Speech unintelligible.  Follows simple commands.   Cranial Nerves: II: Blinks to bilateral confrontation, pupils equal, round, reactive to light and accommodation III,IV, VI: ptosis not present, extra-ocular motions intact bilaterally V,VII: smile symmetric, facial light touch sensation normal bilaterally VIII: hearing normal bilaterally IX,X: gag reflex present XI: bilateral shoulder shrug XII: midline tongue extension Motor: Moves upper extremities weakly but against gravity with no focal weakness appreciated.  Does not lift the lower extremities off the bed Sensory: Pinprick and light touch intact throughout, bilaterally Deep Tendon Reflexes: 2+ in the upper extremities and absent in the lower extremities Plantars: Right: downgoing   Left: downgoing Cerebellar: Not tested due to weakness Gait: not tested due to being nonambulatory    Laboratory Studies:   Basic Metabolic Panel: Recent Labs  Lab 02/11/19 0945  NA 133*  K 4.0  CL 103  CO2 23  GLUCOSE 108*  BUN 23  CREATININE 1.62*  CALCIUM 8.9    Liver Function Tests: Recent Labs  Lab 02/11/19 0945  AST 103*  ALT 61*  ALKPHOS 539*  BILITOT 0.6  PROT 7.3  ALBUMIN 2.2*   No results for input(s): LIPASE, AMYLASE in the last 168 hours. No results for input(s): AMMONIA in the last 168  hours.  CBC: Recent Labs  Lab 02/11/19 0945  WBC 6.8  NEUTROABS 4.4  HGB 8.8*  HCT 27.7*  MCV 82.9  PLT 392    Cardiac Enzymes: No results for input(s): CKTOTAL, CKMB, CKMBINDEX, TROPONINI in the last 168 hours.  BNP: Invalid input(s): POCBNP  CBG: No results for input(s): GLUCAP in the last 168 hours.  Microbiology: Results for orders placed or performed during the hospital encounter of 01/02/19  Blood Culture (routine x 2)     Status: None   Collection Time: 01/02/19 11:03 PM   Specimen: BLOOD  Result Value Ref Range Status   Specimen Description BLOOD RIGHT FOREARM  Final   Special Requests   Final    BOTTLES DRAWN AEROBIC AND ANAEROBIC Blood Culture results may not be optimal due to an inadequate volume of blood received in culture bottles   Culture   Final    NO GROWTH 18 DAYS Performed at [facility], [address] [place], [place] [address]    Report Status 01/20/2019 FINAL  Final  Blood Culture (routine x 2)     Status: None   Collection Time: 01/02/19 11:03 PM   Specimen: BLOOD  Result Value Ref Range Status   Specimen Description BLOOD RIGHT ASSIST CONTROL  Final   Special Requests   Final    BOTTLES DRAWN AEROBIC AND ANAEROBIC Blood Culture adequate volume   Culture   Final    NO GROWTH 18 DAYS Performed at [facility], [address] [place], [place] [address]    Report Status 01/20/2019 FINAL  Final  Urine culture     Status: Abnormal   Collection Time: 01/02/19 11:03 PM   Specimen: In/Out Cath Urine  Result Value Ref Range Status   Specimen Description   Final    IN/OUT CATH URINE Performed at [facility], [address] [place], [place] [address]    Special Requests   Final    NONE Performed at [facility], [address] [place], [place] [address]    Culture 80,000 COLONIES/mL YEAST (  A)  Final   Report Status 01/04/2019 FINAL  Final    Coagulation Studies: No results for input(s):  LABPROT, INR in the last 72 hours.  Urinalysis:  Recent Labs  Lab 02/11/19 1040  COLORURINE YELLOW*  LABSPEC 1.012  PHURINE 6.0  GLUCOSEU NEGATIVE  HGBUR NEGATIVE  BILIRUBINUR NEGATIVE  KETONESUR NEGATIVE  PROTEINUR NEGATIVE  NITRITE NEGATIVE  LEUKOCYTESUR LARGE*    Lipid Panel:     Component Value Date/Time   CHOL 183 10/05/2017 0631   CHOL 157 03/21/2017 0950   CHOL 148 06/15/2016 1046   TRIG 105 10/05/2017 0631   TRIG 109 06/15/2016 1046   HDL 89 10/05/2017 0631   HDL 42 03/21/2017 0950   CHOLHDL 2.1 10/05/2017 0631   VLDL 21 10/05/2017 0631   VLDL 22 06/15/2016 1046   LDLCALC 73 10/05/2017 0631   LDLCALC 85 03/21/2017 0950    HgbA1C:  Lab Results  Component Value Date   HGBA1C 5.1 12/21/2018    Urine Drug Screen:      Component Value Date/Time   LABOPIA NONE DETECTED 06/10/2018 1320   COCAINSCRNUR NONE DETECTED 06/10/2018 1320   LABBENZ NONE DETECTED 06/10/2018 1320   AMPHETMU NONE DETECTED 06/10/2018 1320   THCU NONE DETECTED 06/10/2018 1320   LABBARB NONE DETECTED 06/10/2018 1320    Alcohol Level: No results for input(s): ETH in the last 168 hours.  Other results: EKG: sinus tachycardia at 103 bpm.  Imaging: CT Head Wo Contrast  Result Date: 02/11/2019 CLINICAL DATA:  Altered mental status/unresponsive. EXAM: CT HEAD WITHOUT CONTRAST TECHNIQUE: Contiguous axial images were obtained from the base of the skull through the vertex without intravenous contrast. COMPARISON:  December 24, 2018 FINDINGS: Brain: Mild diffuse atrophy is stable. There is no intracranial mass, hemorrhage, extra-axial fluid collection, or midline shift. There is mild small vessel disease in the centra semiovale bilaterally. No acute appearing infarct is evident on this study. Vascular: No hyperdense vessel. There is calcification in each carotid siphon region. There is slight calcification in the distal left vertebral artery. Skull: The bony calvarium appears intact.  Sinuses/Orbits: There is mucosal thickening in several ethmoid air cells. Other visualized paranasal sinuses are clear. Orbits appear symmetric bilaterally. Other: There is diffuse opacification of mastoid air cells on the right. Mastoids on the left are clear. IMPRESSION: Mild atrophy with mild periventricular small vessel disease. No acute infarct. No mass or hemorrhage. There are foci of arterial vascular calcification. There is mucosal thickening in several ethmoid air cells. There is diffuse opacification of mastoid air cells on the right, a finding present on previous study as well. Electronically Signed   By: Lowella Grip III M.D.   On: 02/11/2019 10:08   MR BRAIN WO CONTRAST  Result Date: 02/11/2019 CLINICAL DATA:  Ataxia.  Altered mental status. EXAM: MRI HEAD WITHOUT CONTRAST TECHNIQUE: Multiplanar, multiecho pulse sequences of the brain and surrounding structures were obtained without intravenous contrast. COMPARISON:  Head CT same day. FINDINGS: Brain: Diffusion imaging suffers from some motion degradation but does not show any acute or subacute infarction. The brainstem shows chronic small-vessel change affecting the pons. There are moderate chronic appearing small vessel ischemic changes of the hemispheric white matter. Few old small vessel infarctions in the right basal ganglia. No large vessel territory infarction. No mass lesion, hemorrhage, hydrocephalus or extra-axial collection. Vascular: Major vessels at the base of the brain show flow. Skull and upper cervical spine: Negative Sinuses/Orbits: Paranasal sinuses are clear. Extensive mastoid effusion on the right  could possibly be symptomatic. Other: None IMPRESSION: No acute intracranial finding. Chronic small-vessel ischemic changes affecting the pons, right basal ganglia and cerebral hemispheric white matter. Extensive mastoid effusion on the right which could possibly be symptomatic. Electronically Signed   By: Nelson Chimes M.D.   On:  02/11/2019 13:34   DG Chest Port 1 View  Result Date: 02/11/2019 CLINICAL DATA:  Altered mental status and hypertension. EXAM: PORTABLE CHEST 1 VIEW COMPARISON:  January 05, 2019 FINDINGS: There is atelectatic change in the lung bases. There is no edema or consolidation. Heart is borderline enlarged with pulmonary vascularity normal. Aorta is prominent with aortic atherosclerosis. No adenopathy. Bones are osteoporotic. There is degenerative change in each shoulder. IMPRESSION: Bibasilar atelectasis. No edema or consolidation. Stable cardiac silhouette. Aortic prominence may be indicative of chronic hypertension. Aortic Atherosclerosis (ICD10-I70.0). Bones osteoporotic. Electronically Signed   By: Lowella Grip III M.D.   On: 02/11/2019 10:22     Assessment/Plan: 78 y.o. female presenting with altered mental status.  On Eliquis.  MRI of the brain reviewed and shows no acute changes.  From review of chart has had multiple admissions in the past due to altered mental status and changes in speech.  Was not found to be secondary to acute ischemia and was felt to possibly be secondary to seizure.  Patient on Neurontin, Vimpat (100mg  BID) and Keppra (1000mg  BID).  Patient also with elevated creatinine and LFT's therefore can not rule out a metabolic encephalopathy.  Recommendations: 1. Continue Vimpat at current dose.  May give IV if patient unable to take po 2. Increase Keppra to 1500mg  BID.  May give IV if patient unable to take po 3. Agree with addressing metabolic issues 4. Serum ammonia 5. Seizure precautions    Alexis Goodell, MD Neurology [phone] 02/11/2019, 2:51 PM

## 2019-02-11 NOTE — H&P (Signed)
History and Physical    Katherine [name] [id number] DOB: 1940-11-01 DOA: 02/11/2019  PCP: Valerie Roys, DO  Patient coming from: Assisted living  I have personally briefly reviewed patient's old medical records in [place]  Chief Complaint: Unresponsiveness  HPI: Katherine [name] is a 78 y.o. female with medical history significant of chronic kidney disease stage III, COPD, type 2 diabetes mellitus, GERD, hypertension, obesity, seizures, pulmonary embolism on anticoagulation, stroke who was brought to the emergency department from her care home after her aide noted her to be unresponsive in bed this morning.  Patient is altered on my evaluation is unable to provide history.  History is obtained by reviewing medical record, speaking with the ED team.  Per documentation the patient was apparently normal yesterday though unclear exactly what time.  At baseline she is not ambulatory but is able to communicate her needs with the assisted living facility staff.  The patient had a positive coronavirus in early November.    On my evaluation patient is resting in bed.  She is in no visible distress.  She is not verbally responsive but does make grunting noises and does follow some commands.  She is able to lift her left arm but is unable to move her right arm.  She is unable to move her lower extremities.  She is unable to follow my finger on extraocular muscles examination.  Vital signs within normal limits.  Patient is afebrile normotensive with adequate heart rate and blood pressure.  She is not requiring any supplemental oxygen.  ED Course: Initial laboratory investigations in the emergency department overall unrevealing.  Hematologic and metabolic panels are overall within normal limits.  Patient does have some evidence of white blood cells on her urinalysis but no bacteria.  Imaging survey also unrevealing.  Chest x-ray did not reveal any acute infiltrates.  CT head demonstrates no  infarction but evidence of small vessel ischemic disease.  Internal medicine called for admission and further work-up patient's encephalopathy.  Review of Systems: Unable to obtain  Past Medical History:  Diagnosis Date  . Angioedema   . Anxiety and depression   . CHF (congestive heart failure) ([place])   . Chronic constipation   . Chronic kidney disease    stage 3  . COPD (chronic obstructive pulmonary disease) ([place])   . Diabetes mellitus without complication ([place])   . Gallstones   . GERD (gastroesophageal reflux disease)   . Hypertension   . Left ventricular hypertrophy   . Osteoarthritis   . Prurigo nodularis   . Seizures ([place])   . Stroke ([place])   . Tobacco abuse   . Vitamin D deficiency disease     Past Surgical History:  Procedure Laterality Date  . CHOLECYSTECTOMY    . 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   Procedure: TOTAL KNEE ARTHROPLASTY;  Surgeon: Hessie Knows, MD;  Location: [facility] ORS;  Service: Orthopedics;  Laterality: Right;     reports that she has quit smoking. Her smoking use included cigarettes. She has a 30.00 pack-year smoking history. She has never used smokeless tobacco. She reports that she does not drink alcohol or use drugs.  Allergies  Allergen Reactions  . Bee Venom Swelling  . Enalapril Maleate Swelling    Family History  Problem Relation Age of Onset  . Alcohol abuse Mother   . Sickle cell anemia Daughter   . Hypertension Son   . Cancer Neg Hx   .  COPD Neg Hx   . Diabetes Neg Hx   . Heart disease Neg Hx   . Stroke Neg Hx     Prior to Admission medications   Medication Sig Start Date End Date Taking? Authorizing Provider  acetaminophen (TYLENOL) 650 MG CR tablet Take 650 mg by mouth 3 (three) times daily.    Yes [provider]  albuterol (ACCUNEB) 0.63 MG/3ML nebulizer solution Take 3 mLs by nebulization every 8 (eight) hours as needed for wheezing.    Yes [provider]    apixaban (ELIQUIS) 5 MG TABS tablet Take 1 tablet (5 mg total) by mouth 2 (two) times daily. To initiate this on 11/29/2018 after completing the 10 mg twice daily treatment regimen 11/29/18  Yes Ojie, Jude, MD  atorvastatin (LIPITOR) 80 MG tablet Take 80 mg by mouth every evening.    Yes [provider]  cetirizine (ZYRTEC) 10 MG tablet Take 5 mg by mouth at bedtime.   Yes [provider]  cholecalciferol (VITAMIN D) 1000 units tablet Take 1,000 Units by mouth daily.   Yes [provider]  diclofenac sodium (VOLTAREN) 1 % GEL Apply 2 g topically every 12 (twelve) hours as needed (for left knee pain).    Yes [provider]  docusate sodium (COLACE) 100 MG capsule Take 100 mg by mouth 2 (two) times daily.   Yes [provider]  FLUoxetine (PROZAC) 40 MG capsule Take 40 mg by mouth daily.    Yes [provider]  Fluticasone-Umeclidin-Vilant 100-62.5-25 MCG/INH AEPB Inhale 1 puff into the lungs daily.   Yes [provider]  gabapentin (NEURONTIN) 300 MG capsule Take 300 mg by mouth at bedtime.   Yes [provider]  ipratropium (ATROVENT) 0.03 % nasal spray Place 2 sprays into both nostrils 2 (two) times daily.    Yes [provider]  lacosamide 100 MG TABS Take 1 tablet (100 mg total) by mouth 2 (two) times daily. 12/31/18  Yes Thurnell Lose, MD  levETIRAcetam (KEPPRA XR) 500 MG 24 hr tablet Take 2 tablets (1,000 mg total) by mouth daily. 01/01/19  Yes Thurnell Lose, MD  linagliptin (TRADJENTA) 5 MG TABS tablet Take 5 mg by mouth daily.   Yes [provider]  Multiple Vitamin (MULTIVITAMIN WITH MINERALS) TABS tablet Take 1 tablet by mouth daily.   Yes [provider]  pantoprazole (PROTONIX) 40 MG tablet Take 1 tablet (40 mg total) by mouth daily. 04/29/18  Yes Bettey Costa, MD  vitamin C (VITAMIN C) 500 MG tablet Take 1 tablet (500 mg total) by mouth daily. 01/12/19  Yes Allie Bossier, MD  zinc  sulfate 220 (50 Zn) MG capsule Take 1 capsule (220 mg total) by mouth daily. 01/12/19  Yes Allie Bossier, MD  ipratropium-albuterol (DUONEB) 0.5-2.5 (3) MG/3ML SOLN Take 3 mLs by nebulization 3 (three) times daily. 07/17/18   Gladstone Lighter, MD  meclizine (ANTIVERT) 25 MG tablet Take 25 mg by mouth every 12 (twelve) hours as needed for dizziness.     [provider]  mupirocin ointment (BACTROBAN) 2 % Place 1 application into the nose 2 (two) times daily. 06/13/18   Loletha Grayer, MD  nitroGLYCERIN (NITROSTAT) 0.4 MG SL tablet Place 0.4 mg under the tongue every 5 (five) minutes as needed for chest pain.  08/07/18   [provider]    Physical Exam: Vitals:   02/11/19 1200 02/11/19 1202 02/11/19 1215 02/11/19 1230  BP:  (!) 133/94  123/89  Pulse: 92  91 88  Resp: (!) 21  (!) 24 20  Temp:      TempSrc:      SpO2: 100%  100% 100%  Weight:      Height:        Constitutional: NAD, calm, comfortable Vitals:   02/11/19 1200 02/11/19 1202 02/11/19 1215 02/11/19 1230  BP:  (!) 133/94  123/89  Pulse: 92  91 88  Resp: (!) 21  (!) 24 20  Temp:      TempSrc:      SpO2: 100%  100% 100%  Weight:      Height:       Eyes: Unable to assess extraocular muscles.  Lids and conjunctive are normal ENMT: Mucous membranes dry.  Poor dentition Neck: normal, supple, no masses, no thyromegaly Respiratory: Poor inspiratory effort.  Scattered crackles.  Decreased lung sounds Cardiovascular: Regular rate and rhythm, no murmurs. No extremity edema. 2+ pedal pulses Abdomen: no tenderness, no masses palpated. Bowel sounds positive.  Musculoskeletal: Decreased muscle tone diffusely.  Unable to move lower extremities.  Clubbing noted bilateral lower extremities Skin: no rashes, lesions, ulcers. No induration Neurologic: Unable to assess. Psychiatric: Oriented to person but verbally not responsive    Labs on Admission: I have personally reviewed following labs and imaging  studies  CBC: Recent Labs  Lab 02/11/19 0945  WBC 6.8  NEUTROABS 4.4  HGB 8.8*  HCT 27.7*  MCV 82.9  PLT 0000000   Basic Metabolic Panel: Recent Labs  Lab 02/11/19 0945  NA 133*  K 4.0  CL 103  CO2 23  GLUCOSE 108*  BUN 23  CREATININE 1.62*  CALCIUM 8.9   GFR: Estimated Creatinine Clearance: 34.2 mL/min (A) (by C-G formula based on SCr of 1.62 mg/dL (H)). Liver Function Tests: Recent Labs  Lab 02/11/19 0945  AST 103*  ALT 61*  ALKPHOS 539*  BILITOT 0.6  PROT 7.3  ALBUMIN 2.2*   No results for input(s): LIPASE, AMYLASE in the last 168 hours. No results for input(s): AMMONIA in the last 168 hours. Coagulation Profile: No results for input(s): INR, PROTIME in the last 168 hours. Cardiac Enzymes: No results for input(s): CKTOTAL, CKMB, CKMBINDEX, TROPONINI in the last 168 hours. BNP (last 3 results) No results for input(s): PROBNP in the last 8760 hours. HbA1C: No results for input(s): HGBA1C in the last 72 hours. CBG: No results for input(s): GLUCAP in the last 168 hours. Lipid Profile: No results for input(s): CHOL, HDL, LDLCALC, TRIG, CHOLHDL, LDLDIRECT in the last 72 hours. Thyroid Function Tests: No results for input(s): TSH, T4TOTAL, FREET4, T3FREE, THYROIDAB in the last 72 hours. Anemia Panel: No results for input(s): VITAMINB12, FOLATE, FERRITIN, TIBC, IRON, RETICCTPCT in the last 72 hours. Urine analysis:    Component Value Date/Time   COLORURINE YELLOW (A) 02/11/2019 1040   APPEARANCEUR CLEAR (A) 02/11/2019 1040   APPEARANCEUR Clear 02/20/2013 1459   LABSPEC 1.012 02/11/2019 1040   LABSPEC 1.012 02/20/2013 1459   PHURINE 6.0 02/11/2019 1040   GLUCOSEU NEGATIVE 02/11/2019 1040   GLUCOSEU Negative 02/20/2013 1459   HGBUR NEGATIVE 02/11/2019 1040   BILIRUBINUR NEGATIVE 02/11/2019 1040   BILIRUBINUR Negative 02/20/2013 1459   KETONESUR NEGATIVE 02/11/2019 1040   PROTEINUR NEGATIVE 02/11/2019 1040   NITRITE NEGATIVE 02/11/2019 1040   LEUKOCYTESUR  LARGE (A) 02/11/2019 1040   LEUKOCYTESUR Negative 02/20/2013 1459    Radiological Exams on Admission: CT Head Wo Contrast  Result Date: 02/11/2019 CLINICAL DATA:  Altered mental status/unresponsive.  EXAM: CT HEAD WITHOUT CONTRAST TECHNIQUE: Contiguous axial images were obtained from the base of the skull through the vertex without intravenous contrast. COMPARISON:  December 24, 2018 FINDINGS: Brain: Mild diffuse atrophy is stable. There is no intracranial mass, hemorrhage, extra-axial fluid collection, or midline shift. There is mild small vessel disease in the centra semiovale bilaterally. No acute appearing infarct is evident on this study. Vascular: No hyperdense vessel. There is calcification in each carotid siphon region. There is slight calcification in the distal left vertebral artery. Skull: The bony calvarium appears intact. Sinuses/Orbits: There is mucosal thickening in several ethmoid air cells. Other visualized paranasal sinuses are clear. Orbits appear symmetric bilaterally. Other: There is diffuse opacification of mastoid air cells on the right. Mastoids on the left are clear. IMPRESSION: Mild atrophy with mild periventricular small vessel disease. No acute infarct. No mass or hemorrhage. There are foci of arterial vascular calcification. There is mucosal thickening in several ethmoid air cells. There is diffuse opacification of mastoid air cells on the right, a finding present on previous study as well. Electronically Signed   By: Lowella Grip III M.D.   On: 02/11/2019 10:08   DG Chest Port 1 View  Result Date: 02/11/2019 CLINICAL DATA:  Altered mental status and hypertension. EXAM: PORTABLE CHEST 1 VIEW COMPARISON:  January 05, 2019 FINDINGS: There is atelectatic change in the lung bases. There is no edema or consolidation. Heart is borderline enlarged with pulmonary vascularity normal. Aorta is prominent with aortic atherosclerosis. No adenopathy. Bones are osteoporotic. There is  degenerative change in each shoulder. IMPRESSION: Bibasilar atelectasis. No edema or consolidation. Stable cardiac silhouette. Aortic prominence may be indicative of chronic hypertension. Aortic Atherosclerosis (ICD10-I70.0). Bones osteoporotic. Electronically Signed   By: Lowella Grip III M.D.   On: 02/11/2019 10:22    EKG: Independently reviewed.  Sinus tachycardia  Assessment/Plan Active Problems:   Acute encephalopathy  Acute encephalopathy Unclear etiology Per documentation the patient was less her baseline yesterday however this is somewhat unclear Urinalysis demonstrates some evidence of white blood cells but no bacteria Encephalopathy could be metabolic in origin however higher suspicion for neurologic cause CT head does reveal some small vessel ischemic disease however overall unrevealing Plan: Admit inpatient 1 dose of empiric Rocephin Neurology consulted-message sent to Dr. Doy Mince MRI brain stroke protocol Echocardiogram Frequent neuro checks PT, OT, SLP consult N.p.o. pending speech evaluation Seizure, aspiration, fall precautions  Siezure disorder Breakthrough seizures are also on differential Continue home Vimpat and Keppra Check serum Vimpat and Keppra level  History of pulmonary embolism Chronic anticoagulation PE diagnosed October 2020 Continue Eliquis at home dose  COPD Does not appear to be in exacerbation Continue home nebulizer regimen  Hyperlipidemia Continue Lipitor  History of ESBL Formerly on Invanz 1 g daily Currently no indication For urine culture education  Depression Continue Prozac  GERD Continue home PPI   DVT prophylaxis: Eliquis Code Status: Full code Family Communication: none Disposition Plan: Anticipate SNF vs ALF Consults called: Neurology- Dr. Doy Mince Admission status: Inpatient   Sidney Ace MD [facility] Pager [phone]  If 7PM-7AM, please contact  night-coverage [email or web address] Password [facility]  02/11/2019, 12:44 PM

## 2019-02-11 NOTE — ED Triage Notes (Signed)
Patient arrived by EMS from [facility] for AMs/unresponsiveness noticed this AM around 7am. Patient has snoring respirations noted upon arrival.

## 2019-02-11 NOTE — ED Notes (Signed)
Patient had 1 episode where she looked at this RN and said "where am I? whats going on?" This RN asked patient to tell me her name and would not answer. When asked patient if she was in pain she said "head" but would not give a number

## 2019-02-11 NOTE — ED Notes (Signed)
Called floor to give report. Left name and ascom number. Will collect covid swab and send to lab.

## 2019-02-11 NOTE — ED Notes (Signed)
Pt given fresh warm blankets. Reports mild HA. A&Ox1 (self only). Switched BP cuff to L upper arm as pt reports R arm pain. Pt flinches when this RN touches R fa. Pt moves arm slightly to allow for removal of cuff. R radial pulse 2+ and arm/hand warm. Appropriate color. Bed locked low. Rails up. Call bell within reach.

## 2019-02-11 NOTE — ED Notes (Signed)
Pt leaving for MRI. Will complete swallow screen and give meds once pt back should she pass screen.

## 2019-02-11 NOTE — Progress Notes (Signed)
Confirmed with IP that patient does not need to be swabbed at this current time due to positive result in November 2020. Madlyn Frankel, RN

## 2019-02-11 NOTE — ED Notes (Signed)
MRI called this RN inquiring if pt was being moved to floor soon or if they could come get pt for MRI. Told pt not assigned room yet.

## 2019-02-12 ENCOUNTER — Inpatient Hospital Stay (HOSPITAL_COMMUNITY)
Admit: 2019-02-12 | Discharge: 2019-02-12 | Disposition: A | Discharge status: Home / Self Care | Payer: Medicare HMO | Attending: Internal Medicine | Admitting: Internal Medicine

## 2019-02-12 DIAGNOSIS — I6389 Other cerebral infarction: Secondary | ICD-10-CM

## 2019-02-12 LAB — URINE CULTURE: Culture: NO GROWTH

## 2019-02-12 LAB — GLUCOSE, CAPILLARY
Glucose-Capillary: 60 mg/dL — ABNORMAL LOW (ref 70–99)
Glucose-Capillary: 91 mg/dL (ref 70–99)
Glucose-Capillary: 98 mg/dL (ref 70–99)
Glucose-Capillary: 113 mg/dL — ABNORMAL HIGH (ref 70–99)
Glucose-Capillary: 102 mg/dL — ABNORMAL HIGH (ref 70–99)

## 2019-02-12 LAB — LIPID PANEL
Cholesterol: 103 mg/dL (ref 0–200)
HDL: 51 mg/dL (ref >40)
LDL Cholesterol: 43 mg/dL (ref 0–99)
Total CHOL/HDL Ratio: 2 RATIO
Triglycerides: 46 mg/dL (ref <150)
VLDL: 9 mg/dL (ref 0–40)

## 2019-02-12 LAB — SARS CORONAVIRUS 2 (TAT 6-24 HRS): SARS Coronavirus 2: NEGATIVE

## 2019-02-12 LAB — ECHOCARDIOGRAM COMPLETE
Height: 65 in
Weight: 3548.52 oz

## 2019-02-12 LAB — HEMOGLOBIN A1C
Hgb A1c MFr Bld: 5.2 % (ref 4.8–5.6)
Mean Plasma Glucose: 102.54 mg/dL

## 2019-02-12 LAB — AMMONIA: Ammonia: 18 umol/L (ref 9–35)

## 2019-02-12 LAB — LACOSAMIDE: Lacosamide: 12.2 ug/mL — ABNORMAL HIGH (ref 5.0–10.0)

## 2019-02-12 MED ORDER — DEXTROSE 50 % IV SOLN
INTRAVENOUS | Status: AC
  Administered 2019-02-12: 50 mL
  Filled 2019-02-12: qty 50

## 2019-02-12 MED ORDER — MORPHINE SULFATE (PF) 2 MG/ML IV SOLN
2.0000 mg | Freq: Once | INTRAVENOUS | Status: AC
  Administered 2019-02-12: 2 mg via INTRAVENOUS
  Filled 2019-02-12: qty 1

## 2019-02-12 NOTE — Evaluation (Signed)
Clinical/Bedside Swallow Evaluation Patient Details  Name: Azirah Perkowski MRN: [id number] Date of Birth: 10-22-1940  Today's Date: 02/12/2019 Time: SLP Start Time (ACUTE ONLY): 0945 SLP Stop Time (ACUTE ONLY): 1030 SLP Time Calculation (min) (ACUTE ONLY): 45 min  Past Medical History:  Past Medical History:  Diagnosis Date  . Angioedema   . Anxiety and depression   . CHF (congestive heart failure) ([place])   . Chronic constipation   . Chronic kidney disease    stage 3  . COPD (chronic obstructive pulmonary disease) ([place])   . Diabetes mellitus without complication ([place])   . Gallstones   . GERD (gastroesophageal reflux disease)   . Hypertension   . Left ventricular hypertrophy   . Osteoarthritis   . Prurigo nodularis   . Seizures ([place])   . Stroke ([place])   . Tobacco abuse   . Vitamin D deficiency disease    Past Surgical History:  Past Surgical History:  Procedure Laterality Date  . CHOLECYSTECTOMY    . 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   Procedure: TOTAL KNEE ARTHROPLASTY;  Surgeon: Hessie Knows, MD;  Location: [facility] ORS;  Service: Orthopedics;  Laterality: Right;   HPI:  Pt is a 78 y.o. female with medical history significant of chronic kidney disease stage III, COPD, type 2 diabetes mellitus, GERD, hypertension, obesity, seizures, pulmonary embolism on anticoagulation, stroke who was brought to the emergency department from her care home after her aide noted her to be unresponsive in bed this morning.  Patient is altered on my evaluation is unable to provide history.  History is obtained by reviewing medical record, speaking with the ED team.  Per documentation the patient was apparently normal yesterday though unclear exactly what time.  At baseline she is not ambulatory but is able to communicate her needs with the assisted living facility staff.  The patient had a positive coronavirus in early November.  Chest x-ray did not reveal any  acute infiltrates.  CT head demonstrates no infarction but evidence of small vessel ischemic disease.  Internal medicine called for admission and further work-up patient's encephalopathy.  She is on a PPI for GERD.    Assessment / Plan / Recommendation Clinical Impression  Pt appears to present w/ grossly adequate oropharyngeal phase swallow function; appears at her baseline as per recent assessment in past ~4-6 months. She is EDENTULOUS, has GERD, and has COPD which can impact swallowing/breathing timing AND ability to effectively masticate/chew any foods she eats - especially meats, breads(she does not wear dentures). Pt consumed trials of thin liquids via Cup/Straw then purees and soft solid trials w/ no overt s/s of aspiration; no decline in vocal quality or respiratory presentation during/post trials. During the Oral phase, pt needed min increased time for full mastication to effectively chew/mash/gum the solid boluses for A-P transfer/swallow. Given increased time, pt was able to accomplish swallowing/clearing of solid bolus trials. Pt appeared to benefit from taking a small sip of liquid between the trials to aid clearing. Pt educated on taking rest breaks as well to reduce any SOB w/ any exertion of the tasks of eating/drinking. Pt fed self given setup. Of note, pt has c/o GI dysmotility/discomfort in past and stated the food and liquids "TRY TO COME BACK UP SOMETIMES". Noted BELCHING post trials. Pt has baseline of GERD. Recommend a Dysphagia level 3 (mech soft w/ chopped meats d/t edentulous status and GI dysomotlity complaints) w/ Thin liquids. Moisten foods well. General  aspiration precautions, Reflux precautions d/t GERD dx. Recommend Pills w/ Puree for safer swallowing if needed. Reduce distractions during meals; Rest Breaks during meals. Per recent Cognitive-linguistic eval last admission in 09/2017 at another facility, pt presented w/ reduced sustained attention, basic to mildly complex  comprehension, confrontational and divergent naming, and intellectual awareness per the assessment. Pt would benefit from Monitoring at all meals for follow through cues w/ strategies/precautions/foods. SLP Visit Diagnosis: Dysphagia, unspecified (R13.10)(Edentulous at baseline)    Aspiration Risk  (reduced following general precautions)    Diet Recommendation  Mech Soft diet (d/t Edentulous status, moistened foods/cut meats); Thin liquids. General aspiration precautions. REFLUX precautions d/t baseline GERD.   Medication Administration: Whole meds with puree(IF needed for safer swallowing)    Other  Recommendations Recommended Consults: (Dietician f/u as needed) Oral Care Recommendations: Oral care BID;Patient independent with oral care;Staff/trained caregiver to provide oral care Other Recommendations: (n/a)   Follow up Recommendations None      Frequency and Duration (n/a)  (n/a)       Prognosis Prognosis for Safe Diet Advancement: Fair(-Good) Barriers to Reach Goals: Time post onset;Severity of deficits      Swallow Study   General Date of Onset: 02/11/19 HPI: Pt is a 78 y.o. female with medical history significant of chronic kidney disease stage III, COPD, type 2 diabetes mellitus, GERD, hypertension, obesity, seizures, pulmonary embolism on anticoagulation, stroke who was brought to the emergency department from her care home after her aide noted her to be unresponsive in bed this morning.  Patient is altered on my evaluation is unable to provide history.  History is obtained by reviewing medical record, speaking with the ED team.  Per documentation the patient was apparently normal yesterday though unclear exactly what time.  At baseline she is not ambulatory but is able to communicate her needs with the assisted living facility staff.  The patient had a positive coronavirus in early November.  Chest x-ray did not reveal any acute infiltrates.  CT head demonstrates no infarction  but evidence of small vessel ischemic disease.  Internal medicine called for admission and further work-up patient's encephalopathy.  She is on a PPI for GERD.  Type of Study: Bedside Swallow Evaluation Previous Swallow Assessment: 06/2018, 09/2018, 10/2018, 12/2018 -- rec'd mech soft diet w/ thin liquids; declined attention to cognitive tasks noted Diet Prior to this Study: NPO(regular diet at home - "I can eat meats") Temperature Spikes Noted: No(wbc 6.8) Respiratory Status: Nasal cannula(1L) History of Recent Intubation: No Behavior/Cognition: Alert;Cooperative;Pleasant mood;Distractible;Requires cueing(min) Oral Cavity Assessment: Within Functional Limits Oral Care Completed by SLP: Yes Oral Cavity - Dentition: Edentulous(baseline) Vision: Functional for self-feeding Self-Feeding Abilities: Able to feed self;Needs set up;Needs assist Patient Positioning: Upright in bed(needed min cues ) Baseline Vocal Quality: Normal Volitional Cough: Strong Volitional Swallow: Able to elicit    Oral/Motor/Sensory Function Overall Oral Motor/Sensory Function: Within functional limits   Ice Chips Ice chips: Within functional limits Presentation: Spoon(fed; 3 trials)   Thin Liquid Thin Liquid: Within functional limits Presentation: Cup;Self Fed;Straw(~4-5 ozs total) Other Comments: water, juice    Nectar Thick Nectar Thick Liquid: Not tested   Honey Thick Honey Thick Liquid: Not tested   Puree Puree: Within functional limits Presentation: Self Fed;Spoon(~4 ozs)   Solid     Solid: Within functional limits(grossly but is Edentulous) Presentation: Self Fed;Spoon(5-6 trials) Other Comments: mashed/gummed the textured/solid trials; moistened w/ applesauce and alternated w/ liquids       Orinda Kenner, MS, CCC-SLP Royer Cristobal 02/12/2019,3:51  PM

## 2019-02-12 NOTE — Progress Notes (Signed)
Subjective: Patient awake and alert today.  Appears at baseline.    Objective: Current vital signs: BP 128/81 (BP Location: Right Arm)   Pulse 95   Temp 98.6 F (37 C) (Oral)   Resp 17   Ht 5\' 5"  (1.651 m)   Wt 100.6 kg   SpO2 98%   BMI 36.91 kg/m  Vital signs in last 24 hours: Temp:  [97.5 F (36.4 C)-98.6 F (37 C)] 98.6 F (37 C) (12/30 0744) Pulse Rate:  [83-97] 95 (12/30 0744) Resp:  [17-26] 17 (12/30 0744) BP: (106-133)/(68-94) 128/81 (12/30 0744) SpO2:  [98 %-100 %] 98 % (12/30 0744)  Intake/Output from previous day: 12/29 0701 - 12/30 0700 In: 446.5 [I.V.:446.5] Out: -  Intake/Output this shift: No intake/output data recorded. Nutritional status:  Diet Order            Diet NPO time specified  Diet effective now              Neurologic Exam: Mental Status: Awake and alert.  Speech fluent.  Follows commands.   Cranial Nerves: II: VFF, pupils equal, round, reactive to light and accommodation III,IV, VI: ptosis not present, extra-ocular motions intact bilaterally V,VII: smile symmetric, facial light touch sensation normal bilaterally VIII: hearing normal bilaterally IX,X: gag reflex present XI: bilateral shoulder shrug XII: midline tongue extension Motor: Moves upper extremities weakly but against gravity with no focal weakness appreciated. Sensory: Pinprick and light touch intact throughout, bilaterally   Lab Results: Basic Metabolic Panel: Recent Labs  Lab 02/11/19 0945  NA 133*  K 4.0  CL 103  CO2 23  GLUCOSE 108*  BUN 23  CREATININE 1.62*  CALCIUM 8.9    Liver Function Tests: Recent Labs  Lab 02/11/19 0945  AST 103*  ALT 61*  ALKPHOS 539*  BILITOT 0.6  PROT 7.3  ALBUMIN 2.2*   No results for input(s): LIPASE, AMYLASE in the last 168 hours. Recent Labs  Lab 02/12/19 0627  AMMONIA 18    CBC: Recent Labs  Lab 02/11/19 0945  WBC 6.8  NEUTROABS 4.4  HGB 8.8*  HCT 27.7*  MCV 82.9  PLT 392    Cardiac Enzymes: No  results for input(s): CKTOTAL, CKMB, CKMBINDEX, TROPONINI in the last 168 hours.  Lipid Panel: Recent Labs  Lab 02/12/19 0627  CHOL 103  TRIG 46  HDL 51  CHOLHDL 2.0  VLDL 9  LDLCALC 43    CBG: Recent Labs  Lab 02/11/19 1650 02/11/19 2134 02/12/19 0746 02/12/19 0838  GLUCAP 93 79 60* 91    Microbiology: Results for orders placed or performed during the hospital encounter of 02/11/19  Blood Culture (routine x 2)     Status: None (Preliminary result)   Collection Time: 02/11/19  9:46 AM   Specimen: BLOOD  Result Value Ref Range Status   Specimen Description BLOOD BLOOD LEFT HAND  Final   Special Requests   Final    BOTTLES DRAWN AEROBIC AND ANAEROBIC Blood Culture adequate volume   Culture  Setup Time   Final    GRAM POSITIVE COCCI ANAEROBIC BOTTLE ONLY CRITICAL RESULT CALLED TO, READ BACK BY AND VERIFIED WITH: Rito Ehrlich AT [id number] ON 02/12/19 RH Performed at [facility], [address], [place], [place] [address]    Culture [facility] POSITIVE COCCI  Final   Report Status PENDING  Incomplete  Urine culture     Status: None   Collection Time: 02/11/19 10:40 AM   Specimen: Urine, Catheterized  Result Value  Ref Range Status   Specimen Description   Final    URINE, CATHETERIZED Performed at [facility], [address] [place], [place] [address]    Special Requests   Final    NONE Performed at [facility], [address] [place], [place] [address]    Culture   Final    NO GROWTH Performed at [facility], [place] [address] [place], [place] [address]    Report Status 02/12/2019 FINAL  Final  SARS CORONAVIRUS 2 (TAT 6-24 HRS) Nasopharyngeal Nasopharyngeal Swab     Status: None   Collection Time: 02/11/19  1:51 PM   Specimen: Nasopharyngeal Swab  Result Value Ref Range Status   SARS Coronavirus 2 NEGATIVE NEGATIVE Final    Comment: (NOTE) SARS-CoV-2 target nucleic acids are NOT DETECTED. The SARS-CoV-2 RNA is generally  detectable in upper and lower respiratory specimens during the acute phase of infection. Negative results do not preclude SARS-CoV-2 infection, do not rule out co-infections with other pathogens, and should not be used as the sole basis for treatment or other patient management decisions. Negative results must be combined with clinical observations, patient history, and epidemiological information. The expected result is Negative. Fact Sheet for Patients: SugarRoll.be Fact Sheet for Healthcare Providers: https://www.woods-mathews.com/ This test is not yet approved or cleared by the [place] FDA and  has been authorized for detection and/or diagnosis of SARS-CoV-2 by FDA under an Emergency Use Authorization (EUA). This EUA will remain  in effect (meaning this test can be used) for the duration of the COVID-19 declaration under Section 56 4(b)(1) of the Act, 21 U.S.C. section 360bbb-3(b)(1), unless the authorization is terminated or revoked sooner. Performed at [facility], [place] [address] [place], [place] [address]     Coagulation Studies: No results for input(s): LABPROT, INR in the last 72 hours.  Imaging: CT Head Wo Contrast  Result Date: 02/11/2019 CLINICAL DATA:  Altered mental status/unresponsive. EXAM: CT HEAD WITHOUT CONTRAST TECHNIQUE: Contiguous axial images were obtained from the base of the skull through the vertex without intravenous contrast. COMPARISON:  December 24, 2018 FINDINGS: Brain: Mild diffuse atrophy is stable. There is no intracranial mass, hemorrhage, extra-axial fluid collection, or midline shift. There is mild small vessel disease in the centra semiovale bilaterally. No acute appearing infarct is evident on this study. Vascular: No hyperdense vessel. There is calcification in each carotid siphon region. There is slight calcification in the distal left vertebral artery. Skull: The bony calvarium appears  intact. Sinuses/Orbits: There is mucosal thickening in several ethmoid air cells. Other visualized paranasal sinuses are clear. Orbits appear symmetric bilaterally. Other: There is diffuse opacification of mastoid air cells on the right. Mastoids on the left are clear. IMPRESSION: Mild atrophy with mild periventricular small vessel disease. No acute infarct. No mass or hemorrhage. There are foci of arterial vascular calcification. There is mucosal thickening in several ethmoid air cells. There is diffuse opacification of mastoid air cells on the right, a finding present on previous study as well. Electronically Signed   By: Lowella Grip III M.D.   On: 02/11/2019 10:08   MR BRAIN WO CONTRAST  Result Date: 02/11/2019 CLINICAL DATA:  Ataxia.  Altered mental status. EXAM: MRI HEAD WITHOUT CONTRAST TECHNIQUE: Multiplanar, multiecho pulse sequences of the brain and surrounding structures were obtained without intravenous contrast. COMPARISON:  Head CT same day. FINDINGS: Brain: Diffusion imaging suffers from some motion degradation but does not show any acute or subacute infarction. The  brainstem shows chronic small-vessel change affecting the pons. There are moderate chronic appearing small vessel ischemic changes of the hemispheric white matter. Few old small vessel infarctions in the right basal ganglia. No large vessel territory infarction. No mass lesion, hemorrhage, hydrocephalus or extra-axial collection. Vascular: Major vessels at the base of the brain show flow. Skull and upper cervical spine: Negative Sinuses/Orbits: Paranasal sinuses are clear. Extensive mastoid effusion on the right could possibly be symptomatic. Other: None IMPRESSION: No acute intracranial finding. Chronic small-vessel ischemic changes affecting the pons, right basal ganglia and cerebral hemispheric white matter. Extensive mastoid effusion on the right which could possibly be symptomatic. Electronically Signed   By: Nelson Chimes  M.D.   On: 02/11/2019 13:34   DG Chest Port 1 View  Result Date: 02/11/2019 CLINICAL DATA:  Altered mental status and hypertension. EXAM: PORTABLE CHEST 1 VIEW COMPARISON:  January 05, 2019 FINDINGS: There is atelectatic change in the lung bases. There is no edema or consolidation. Heart is borderline enlarged with pulmonary vascularity normal. Aorta is prominent with aortic atherosclerosis. No adenopathy. Bones are osteoporotic. There is degenerative change in each shoulder. IMPRESSION: Bibasilar atelectasis. No edema or consolidation. Stable cardiac silhouette. Aortic prominence may be indicative of chronic hypertension. Aortic Atherosclerosis (ICD10-I70.0). Bones osteoporotic. Electronically Signed   By: Lowella Grip III M.D.   On: 02/11/2019 10:22    Medications:  I have reviewed the patient's current medications. Scheduled: . apixaban  5 mg Oral BID  . ascorbic acid  500 mg Oral Daily  . atorvastatin  80 mg Oral QPM  . cholecalciferol  1,000 Units Oral Daily  . FLUoxetine  40 mg Oral Daily  . insulin aspart  0-15 Units Subcutaneous TID WC  . insulin aspart  0-5 Units Subcutaneous QHS  . lacosamide  100 mg Oral BID  . levETIRAcetam  1,500 mg Oral Daily  . loratadine  10 mg Oral QHS  .  morphine injection  2 mg Intravenous Once  . multivitamin with minerals  1 tablet Oral Daily  . pantoprazole  40 mg Oral Daily  . zinc sulfate  220 mg Oral Daily    Assessment/Plan: 78 y.o. female presenting with altered mental status.  On Eliquis.  MRI of the brain reviewed and shows no acute changes.  From review of chart has had multiple admissions in the past due to altered mental status and changes in speech.  Was not found to be secondary to acute ischemia and was felt to possibly be secondary to seizure.  Patient on Neurontin, Vimpat (100mg  BID) and Keppra (1000mg  BID).  Patient also with elevated creatinine and LFT's therefore can not rule out a metabolic encephalopathy.  Ammonia  normal. Improved today.  Appears at baseline.    Recommendations: 1. Continue Keppra and Vimpat at current doses 2. Patient to follow up with neurology on an outpatient basis    LOS: 1 day   Alexis Goodell, MD Neurology [phone] 02/12/2019  10:22 AM

## 2019-02-12 NOTE — TOC Initial Note (Signed)
Transition of Care [facility]) - Initial/Assessment Note    Patient Details  Name: Katherine [name] MRN: [id number] Date of Birth: 01-07-1941  Transition of [facility]) CM/SW Contact:    Shelbie Ammons, RN Phone Number: 02/12/2019, 3:05 PM  Clinical Narrative:     RNCM assessment completed via telephone due to + Covid diagnosis. Patient brought to hospital due to periods of unresponsiveness at facility. Patient awake and alert and answered phone on 3rd ring. Patient reports to feeling better today than yesterday aside from feeling very tired. Patient is from [facility] and she plans to return there after discharge from the hospital. Patient denies any other needs at this time.           Expected Discharge Plan: Skilled Nursing Facility Barriers to Discharge: Continued Medical Work up   Patient Goals and CMS Choice Patient states their goals for this hospitalization and ongoing recovery are:: "to get better"      Expected Discharge Plan and Services Expected Discharge Plan: [place] Acute Care Choice: [place] Living arrangements for the past 2 months: [place]                                      Prior Living Arrangements/Services Living arrangements for the past 2 months: [place] Lives with:: Facility Resident Patient language and need for interpreter reviewed:: Yes Do you feel safe going back to the place where you live?: Yes      Need for Family Participation in Patient Care: No (Comment) Care giver support system in place?: No (comment)   Criminal Activity/Legal Involvement Pertinent to Current Situation/Hospitalization: No - Comment as needed  Activities of Daily Living Home Assistive Devices/Equipment: None ADL Screening (condition at time of admission) Patient's cognitive ability adequate to safely complete daily activities?: No Is the patient deaf or have difficulty hearing?:  No Does the patient have difficulty seeing, even when wearing glasses/contacts?: No Does the patient have difficulty concentrating, remembering, or making decisions?: Yes Patient able to express need for assistance with ADLs?: No Does the patient have difficulty dressing or bathing?: Yes Independently performs ADLs?: No Communication: Dependent Is this a change from baseline?: Pre-admission baseline Dressing (OT): Dependent Is this a change from baseline?: Pre-admission baseline Grooming: Dependent Is this a change from baseline?: Pre-admission baseline Feeding: Dependent Is this a change from baseline?: Pre-admission baseline Bathing: Dependent Is this a change from baseline?: Pre-admission baseline Toileting: Dependent Is this a change from baseline?: Pre-admission baseline In/Out Bed: Dependent Is this a change from baseline?: Pre-admission baseline Walks in Home: Dependent Is this a change from baseline?: Pre-admission baseline Does the patient have difficulty walking or climbing stairs?: Yes Weakness of Legs: Both Weakness of Arms/Hands: Both  Permission Sought/Granted                  Emotional Assessment Appearance:: (Patient assessed via telephone due to +Covid diagnosis) Attitude/Demeanor/Rapport: Engaged   Orientation: : Oriented to Self, Oriented to Situation, Oriented to Place   Psych Involvement: No (comment)  Admission diagnosis:  Acute encephalopathy [G93.40] Altered mental status, unspecified altered mental status type [R41.82] Patient Active Problem List   Diagnosis Date Noted  . Acute encephalopathy 02/11/2019  . Sepsis secondary to UTI ([place]) 01/08/2019  . Acute renal failure superimposed on stage 3b chronic kidney disease ([place]) 01/08/2019  . Seizure disorder ([place])  01/08/2019  . Diabetes mellitus type 2, controlled, with complications ([place]) [id number]  . Acute on chronic anemia 01/03/2019  . COVID-19 12/21/2018  . COVID-19 virus infection  12/21/2018  . Hemoptysis 12/20/2018  . Pulmonary embolism ([place]) 11/21/2018  . Delirium   . UTI (urinary tract infection) 08/21/2018  . Chest pain 07/15/2018  . Palliative care encounter 05/10/2018  . Localized edema 05/10/2018  . Shortness of breath 05/10/2018  . Hematemesis 04/28/2018  . Influenza A 04/13/2018  . Acute on chronic congestive heart failure ([place])   . COPD with acute exacerbation ([place]) 02/24/2018  . Chronic diastolic heart failure ([place]) 12/31/2017  . Lymphedema 12/31/2017  . COPD exacerbation ([place]) 11/30/2017  . Urinary tract infection 11/22/2017  . 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[place]) 11/16/2017  . Diabetes mellitus type 2, uncomplicated ([place]) 0000000  . Pressure injury of skin 10/29/2017  . Acute kidney injury superimposed on CKD ([place]) 10/24/2017  . Sepsis ([place]) 10/24/2017  . Possible Seizures ([place]) 10/08/2017  . Hyperlipemia 10/08/2017  . 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 Stroke-like episode ([place]) s/p IV tpa 10/04/2017  . Palliative care by specialist   . Elevated rheumatoid factor 09/05/2017  . Frequent hospital admissions 08/22/2017  . Aphasia 06/28/2017  . Hand pain 03/21/2017  . Dry skin 03/21/2017  . Pain in finger of left hand 09/12/2016  . Chronic fatigue 06/12/2016  . Left knee pain 06/12/2016  . Goals of care, counseling/discussion 03/13/2016  . Primary localized osteoarthritis of right knee 02/08/2016  . OSA (obstructive sleep apnea) 09/16/2015  . Rotator cuff syndrome 09/07/2015  . 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 Sleep disturbance 04/14/2015  . Coronary artery disease 03/14/2015  . 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 Acute respiratory failure with hypoxia ([place]) 09/16/2014  . 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 Anxiety   . Tobacco abuse   . Prurigo nodularis   . GERD (gastroesophageal reflux disease)   . COPD (chronic obstructive pulmonary disease) ([place])   . Osteoarthritis   . Vitamin D deficiency disease    . Chronic constipation   . CKD (chronic kidney disease), stage III   . Essential hypertension 05/20/2013  . Morbid obesity ([place]) 05/20/2013   PCP:  Valerie Roys, DO Pharmacy:   [facility] [address] [place] [place]. [place] [address] Phone: [phone] Fax: [phone]     Social Determinants of Health (SDOH) Interventions    Readmission Risk Interventions Readmission Risk Prevention Plan 11/26/2018 11/22/2018 07/16/2018  Transportation Screening Complete Complete Complete  Medication Review Press photographer) Complete Complete Complete  PCP or Specialist appointment within 3-5 days of discharge (No Data) - Complete  PCP/Specialist Appt Not Complete comments - - -  HRI or [place] (No Data) - Complete  SW Recovery Care/Counseling Consult Complete - Not Complete  SW Consult Not Complete Comments - - na  Palliative Care Screening - - Not Applicable  Medication Reconcilation ([place] Complete Complete Complete  Some recent data might be hidden

## 2019-02-12 NOTE — Progress Notes (Signed)
Brief history:  78 y.o. female with medical history significant of chronic kidney disease stage III, COPD, type 2 diabetes mellitus, GERD, hypertension, obesity, seizures, pulmonary embolism on anticoagulation, stroke who was brought to the emergency department from her care home after her aide noted her to be unresponsive in bed this morning. Initial laboratory investigations in the emergency department overall unrevealing.  Hematologic and metabolic panels are overall within normal limits.  Patient does have some evidence of white blood cells on her urinalysis but no bacteria.  Imaging survey also unrevealing.  Chest x-ray did not reveal any acute infiltrates.  CT head demonstrates no infarction but evidence of small vessel ischemic disease.   Subjective: Appears to be alert this morning.  To converse with treatment team.  Not oriented to time and person.  Denies any fever.  Objective: Vital signs in last 24 hours: Temp:  [97.5 F (36.4 C)-98.6 F (37 C)] 98.6 F (37 C) (12/30 0744) Pulse Rate:  [83-97] 95 (12/30 0744) Resp:  [17-24] 17 (12/30 0744) BP: (106-128)/(68-87) 128/81 (12/30 0744) SpO2:  [98 %-100 %] 98 % (12/30 0744)  Intake/Output from previous day: 12/29 0701 - 12/30 0700 In: 446.5 [I.V.:446.5] Out: -  Intake/Output this shift: No intake/output data recorded.  General: Alert, not oriented to place or time. ENMT: Mucous membranes dry.  Poor dentition Neck: normal, supple, no masses, no thyromegaly Respiratory:  Decreased lung sounds Cardiovascular: Regular rate and rhythm, no murmurs. No extremity edema. 2+ pedal pulses Abdomen: no tenderness, no masses palpated. Bowel sounds positive.  Musculoskeletal: Decreased muscle tone diffusely.  Unable to move lower extremities.  Clubbing noted bilateral lower extremities Skin: no rashes, lesions, ulcers. No induration Neurologic: Unable to assess. Psychiatric: Oriented to person; somewhat confused appearing  Results for  orders placed or performed during the hospital encounter of 02/11/19 (from the past 24 hour(s))  SARS CORONAVIRUS 2 (TAT 6-24 HRS) Nasopharyngeal Nasopharyngeal Swab     Status: None   Collection Time: 02/11/19  1:51 PM   Specimen: Nasopharyngeal Swab  Result Value Ref Range   SARS Coronavirus 2 NEGATIVE NEGATIVE  Glucose, capillary     Status: None   Collection Time: 02/11/19  4:50 PM  Result Value Ref Range   Glucose-Capillary 93 70 - 99 mg/dL  Glucose, capillary     Status: None   Collection Time: 02/11/19  9:34 PM  Result Value Ref Range   Glucose-Capillary 79 70 - 99 mg/dL   Comment 1 Notify RN   Hemoglobin A1c     Status: None   Collection Time: 02/12/19  6:27 AM  Result Value Ref Range   Hgb A1c MFr Bld 5.2 4.8 - 5.6 %   Mean Plasma Glucose 102.54 mg/dL  Lipid panel     Status: None   Collection Time: 02/12/19  6:27 AM  Result Value Ref Range   Cholesterol 103 0 - 200 mg/dL   Triglycerides 46 <150 mg/dL   HDL 51 >40 mg/dL   Total CHOL/HDL Ratio 2.0 RATIO   VLDL 9 0 - 40 mg/dL   LDL Cholesterol 43 0 - 99 mg/dL  Ammonia     Status: None   Collection Time: 02/12/19  6:27 AM  Result Value Ref Range   Ammonia 18 9 - 35 umol/L  Glucose, capillary     Status: Abnormal   Collection Time: 02/12/19  7:46 AM  Result Value Ref Range   Glucose-Capillary 60 (L) 70 - 99 mg/dL  Glucose, capillary  Status: None   Collection Time: 02/12/19  8:38 AM  Result Value Ref Range   Glucose-Capillary 91 70 - 99 mg/dL  Glucose, capillary     Status: None   Collection Time: 02/12/19 11:39 AM  Result Value Ref Range   Glucose-Capillary 98 70 - 99 mg/dL    Studies/Results: CT Head Wo Contrast  Result Date: 02/11/2019 CLINICAL DATA:  Altered mental status/unresponsive. EXAM: CT HEAD WITHOUT CONTRAST TECHNIQUE: Contiguous axial images were obtained from the base of the skull through the vertex without intravenous contrast. COMPARISON:  December 24, 2018 FINDINGS: Brain: Mild diffuse  atrophy is stable. There is no intracranial mass, hemorrhage, extra-axial fluid collection, or midline shift. There is mild small vessel disease in the centra semiovale bilaterally. No acute appearing infarct is evident on this study. Vascular: No hyperdense vessel. There is calcification in each carotid siphon region. There is slight calcification in the distal left vertebral artery. Skull: The bony calvarium appears intact. Sinuses/Orbits: There is mucosal thickening in several ethmoid air cells. Other visualized paranasal sinuses are clear. Orbits appear symmetric bilaterally. Other: There is diffuse opacification of mastoid air cells on the right. Mastoids on the left are clear. IMPRESSION: Mild atrophy with mild periventricular small vessel disease. No acute infarct. No mass or hemorrhage. There are foci of arterial vascular calcification. There is mucosal thickening in several ethmoid air cells. There is diffuse opacification of mastoid air cells on the right, a finding present on previous study as well. Electronically Signed   By: Lowella Grip III M.D.   On: 02/11/2019 10:08   MR BRAIN WO CONTRAST  Result Date: 02/11/2019 CLINICAL DATA:  Ataxia.  Altered mental status. EXAM: MRI HEAD WITHOUT CONTRAST TECHNIQUE: Multiplanar, multiecho pulse sequences of the brain and surrounding structures were obtained without intravenous contrast. COMPARISON:  Head CT same day. FINDINGS: Brain: Diffusion imaging suffers from some motion degradation but does not show any acute or subacute infarction. The brainstem shows chronic small-vessel change affecting the pons. There are moderate chronic appearing small vessel ischemic changes of the hemispheric white matter. Few old small vessel infarctions in the right basal ganglia. No large vessel territory infarction. No mass lesion, hemorrhage, hydrocephalus or extra-axial collection. Vascular: Major vessels at the base of the brain show flow. Skull and upper cervical  spine: Negative Sinuses/Orbits: Paranasal sinuses are clear. Extensive mastoid effusion on the right could possibly be symptomatic. Other: None IMPRESSION: No acute intracranial finding. Chronic small-vessel ischemic changes affecting the pons, right basal ganglia and cerebral hemispheric white matter. Extensive mastoid effusion on the right which could possibly be symptomatic. Electronically Signed   By: Nelson Chimes M.D.   On: 02/11/2019 13:34   DG Chest Port 1 View  Result Date: 02/11/2019 CLINICAL DATA:  Altered mental status and hypertension. EXAM: PORTABLE CHEST 1 VIEW COMPARISON:  January 05, 2019 FINDINGS: There is atelectatic change in the lung bases. There is no edema or consolidation. Heart is borderline enlarged with pulmonary vascularity normal. Aorta is prominent with aortic atherosclerosis. No adenopathy. Bones are osteoporotic. There is degenerative change in each shoulder. IMPRESSION: Bibasilar atelectasis. No edema or consolidation. Stable cardiac silhouette. Aortic prominence may be indicative of chronic hypertension. Aortic Atherosclerosis (ICD10-I70.0). Bones osteoporotic. Electronically Signed   By: Lowella Grip III M.D.   On: 02/11/2019 10:22    Scheduled Meds: . apixaban  5 mg Oral BID  . ascorbic acid  500 mg Oral Daily  . atorvastatin  80 mg Oral QPM  . cholecalciferol  1,000 Units Oral Daily  . FLUoxetine  40 mg Oral Daily  . insulin aspart  0-15 Units Subcutaneous TID WC  . insulin aspart  0-5 Units Subcutaneous QHS  . lacosamide  100 mg Oral BID  . levETIRAcetam  1,500 mg Oral Daily  . loratadine  10 mg Oral QHS  . multivitamin with minerals  1 tablet Oral Daily  . pantoprazole  40 mg Oral Daily  . zinc sulfate  220 mg Oral Daily   Continuous Infusions: . sodium chloride 75 mL/hr at 02/12/19 0635   PRN Meds:acetaminophen **OR** acetaminophen (TYLENOL) oral liquid 160 mg/5 mL **OR** acetaminophen, ipratropium-albuterol, nitroGLYCERIN,  senna-docusate  Assessment/Plan:  Acute encephalopathy Unclear etiology -Suspected to be due to seizure MRI brain: No acute intracranial finding. Chronic small-vessel ischemic changes affecting the pons, right basal ganglia and cerebral hemispheric white matter.   Neurology consulted-continue Keppra and Vimpat.  Appreciate neurology recommendation Echocardiogramm --> pending Frequent neuro checks PT, OT --> recommended skilled nursing facility SLP consult--passed swallowing eval Continue seizure, aspiration, fall precautions -Ammonia normal with mild elevation of LFTs -follow-up LFTs  Abnormal blood culture: 1 out of 2 drawn on 02/11/2019 showing preliminary GPC -Patient has no signs or symptoms of infection -We will repeat blood culture x2 today -We will hold onto antibiotic for now -Continue to monitor  Siezure disorder Breakthrough seizures possibly? Continue home Vimpat and Keppra per neurology  History of pulmonary embolism Chronic anticoagulation PE diagnosed October 2020 Continue Eliquis at home dose  COPD Does not appear to be in exacerbation Continue home nebulizer regimen  Hyperlipidemia Continue Lipitor  History of ESBL Formerly on Invanz 1 g daily Currently no indication For urine culture education  -UA on this admission fairly normal  Depression Continue Prozac  GERD Continue home PPI   DVT prophylaxis: Eliquis Code Status: Full code Family Communication: none Disposition Plan: Anticipate SNF  Consults called: Neurology- Dr. Doy Mince Admission status: Inpatient  LOS: 1 day   Katherine [name]

## 2019-02-12 NOTE — Progress Notes (Signed)
PHARMACY - PHYSICIAN COMMUNICATION CRITICAL VALUE ALERT - BLOOD CULTURE IDENTIFICATION (BCID)  Katherine [name] is an 78 y.o. female who presented to [facility] on 02/11/2019 with a chief complaint of Unresponsiveness  Assessment:  Lab reporting BCx 1 of 4 positive for GPC. Anaerobic bottle only. Patient is afebrile, WBC: wnl, admitted with possible seizures.    Name of physician (or Provider) Contacted: Dr. Izetta Dakin  Current antibiotics: None  Changes to prescribed antibiotics recommended:  Discussed with provider that results could be contaminant and we could consider monitoring patient off antibiotics for now. Per Dr. Izetta Dakin- He will order repeat Bcx and make decision about antibiotics after patient has been seen.   Pernell Dupre, PharmD, BCPS Clinical Pharmacist 02/12/2019 9:33 AM

## 2019-02-12 NOTE — Evaluation (Signed)
Physical Therapy Evaluation Patient Details Name: Katherine [name] MRN: [id number] DOB: 1940-12-21 Today's Date: 02/12/2019   History of Present Illness  Patient is a 78 y.o. female with PMHx of PE on anticoagulation with Eliquis, CKD stage III, DM-2, HTN, COPD, seizures, CVA, UTI, chronic debility/deconditioning-virtually bedbound-nursing home resident-recent admission to [facility] from 11/7-11/17. Patient presented to [facility] ED after a period off unresponsiveness at SNF  Clinical Impression  Pt is a pleasant 78 year old female who was admitted for acute encephalopathy. Pt very motivated with goal to sit at EOB for meals. Reports she hasn't been OOB in months. Last PT note from Spring 2020 pt was able to stand. Since then, essentially bed bound. Pt performs bed mobility with max assist and able to sit at EOB for a few seconds prior to returning back to bed due to dizziness. Pt demonstrates deficits with pain in joints/balance/strength. Would benefit from skilled PT to address above deficits and promote optimal return to PLOF; recommend transition to STR upon discharge from acute hospitalization.     Follow Up Recommendations SNF    Equipment Recommendations  (TBD)    Recommendations for Other Services       Precautions / Restrictions Precautions Precautions: Fall Precaution Comments: High Fall Restrictions Weight Bearing Restrictions: No      Mobility  Bed Mobility Overal bed mobility: Needs Assistance Bed Mobility: Supine to Sit     Supine to sit: Max assist     General bed mobility comments: able to initiate movement towards EOB, however cries out in pain. On 2nd attempt, pt wishes for therapist to perform total assist due to pain. Pt able to sit at EOB and maintain upright posture for 5-10 seconds prior to becoming dizzy and requesting to lie back down. +2 for sliding up in bed.  Transfers                 General transfer comment: unable  Ambulation/Gait                 Stairs            Wheelchair Mobility    Modified Rankin (Stroke Patients Only)       Balance Overall balance assessment: Needs assistance;History of Falls Sitting-balance support: Bilateral upper extremity supported;Feet unsupported Sitting balance-Leahy Scale: Fair Sitting balance - Comments: seated with B UE support, only able to maintain sitting balance for a few seconds due to dizziness                                     Pertinent Vitals/Pain Pain Assessment: Faces Pain Score: 8  Faces Pain Scale: Hurts little more Pain Location: B LE Pain Descriptors / Indicators: Discomfort;Grimacing;Guarding;Headache Pain Intervention(s): Limited activity within patient's tolerance;Repositioned    Home Living Family/patient expects to be discharged to:: Skilled nursing facility                 Additional Comments: Patient reports that she has been bed bound for a year, does not sit at the edge of bed, roll over , stand or walk. Per chart review, pt noted to have stood EOB with therapy given Max A ~10 month PTA.    Prior Function Level of Independence: Needs assistance   Gait / Transfers Assistance Needed: bedbound x 1 yr; but able to assist with bed mobility  ADL's / Homemaking Assistance Needed: total assist for bathing; can feed herself "  but it takes a long time because of the stroke on my right side"  Comments: Pt reports recent fall while rolling out of bed.      Hand Dominance   Dominant Hand: Right    Extremity/Trunk Assessment   Upper Extremity Assessment Upper Extremity Assessment: Generalized weakness(B UE grossly 3/5)    Lower Extremity Assessment Lower Extremity Assessment: Generalized weakness(B  LE grossly 3/5)       Communication   Communication: No difficulties  Cognition Arousal/Alertness: Awake/alert Behavior During Therapy: WFL for tasks assessed/performed Overall Cognitive Status: No family/caregiver present to  determine baseline cognitive functioning                                 General Comments: Pt is alert and oriented to general conversation. Able to follow commands      General Comments General comments (skin integrity, edema, etc.): Pt BLE notably swollen at ankles.    Exercises Other Exercises Other Exercises: Pt educated on falls prevention strategies for home and hospital. Other Exercises: BUE PROM for shoulder flexion, elbow flexion/extension, wrist flexion/extension and digit flexion/extension. Other Exercises: supine ther-ex performed on B LE including AP, quad sets, SLRs, hip abd/add and shoulder flexion. All ther-ex performed x 5 reps with mod assist. Pt limited by pain   Assessment/Plan    PT Assessment Patient needs continued PT services  PT Problem List Decreased strength;Decreased activity tolerance;Decreased balance;Decreased mobility;Pain;Obesity       PT Treatment Interventions Gait training;Therapeutic exercise;Balance training    PT Goals (Current goals can be found in the Care Plan section)  Acute Rehab PT Goals Patient Stated Goal: To sit up and get out of bed more. PT Goal Formulation: With patient Time For Goal Achievement: 02/26/19 Potential to Achieve Goals: Good    Frequency Min 2X/week   Barriers to discharge        Co-evaluation               AM-PAC PT "6 Clicks" Mobility  Outcome Measure Help needed turning from your back to your side while in a flat bed without using bedrails?: A Lot Help needed moving from lying on your back to sitting on the side of a flat bed without using bedrails?: A Lot Help needed moving to and from a bed to a chair (including a wheelchair)?: Total Help needed standing up from a chair using your arms (e.g., wheelchair or bedside chair)?: Total Help needed to walk in hospital room?: Total Help needed climbing 3-5 steps with a railing? : Total 6 Click Score: 8    End of Session Equipment Utilized  During Treatment: Oxygen Activity Tolerance: Treatment limited secondary to medical complications (Comment) Patient left: in bed;with bed alarm set Nurse Communication: Mobility status PT Visit Diagnosis: Muscle weakness (generalized) (M62.81);History of falling (Z91.81);Difficulty in walking, not elsewhere classified (R26.2);Pain Pain - Right/Left: (bilat) Pain - part of body: Knee    Time: [id number] PT Time Calculation (min) (ACUTE ONLY): 29 min   Charges:   PT Evaluation $PT Eval Moderate Complexity: 1 Mod PT Treatments $Therapeutic Exercise: 8-22 mins        Greggory Stallion, PT, DPT [phone]   Katherine [name] 02/12/2019, 12:22 PM

## 2019-02-12 NOTE — Progress Notes (Signed)
*  PRELIMINARY RESULTS* Echocardiogram 2D Echocardiogram has been performed.  Katherine [name] 02/12/2019, 9:14 AM

## 2019-02-12 NOTE — Evaluation (Signed)
Occupational Therapy Evaluation Patient Details Name: Katherine [name] MRN: [id number] DOB: 18-Jan-1941 Today's Date: 02/12/2019    History of Present Illness Patient is a 78 y.o. female with PMHx of PE on anticoagulation with Eliquis, CKD stage III, DM-2, HTN, COPD, seizures, CVA, UTI, chronic debility/deconditioning-virtually bedbound-nursing home resident-recent admission to [facility] from 11/7-11/17. Patient presented to [facility] ED after a period off unresponsiveness at SNF   Clinical Impression   Katherine [name] was seen for OT evaluation this date. Prior to hospital admission, pt was receiving care at a skilled nursing facility. Per chart she was dependent for most ADL tasks, but pt reports she is able to feed herself and participates in grooming tasks. Pt reports she has not walked "In over a year". Chart review indicates pt stood at EOB with max assist from therapists approximately 10 months PTA. Pt A&O to self and limited place but is unable to state date or situation. She c/o significant pain in BUE/BLE with gentle PROM this date. Pt also complaining of 8/10 headache t/o evaluation. Currently pt demonstrates impairments as described below (See OT problem list) which functionaly limit her ability to perform BADL/self-care tasks. Pt currently requires +2 max-total assist for functional transfers as well as Max to total A for bed-level for toileting and bathing. Despite significant functional limitations, pt endorses strong desire to be more independent and "get out of this bed" during OT evaluation. Pt would benefit from skilled OT to address noted impairments and functional limitations (see below for any additional details) in order to maximize safety and independence while minimizing falls risk and caregiver burden.  Upon hospital discharge, recommend STR to maximize safety and return to PLOF.     Follow Up Recommendations  SNF;Supervision/Assistance - 24 hour    Equipment Recommendations  Other  (comment)(Deferred to next venue of care.)    Recommendations for Other Services       Precautions / Restrictions Precautions Precautions: Fall Precaution Comments: High Fall Restrictions Weight Bearing Restrictions: No      Mobility Bed Mobility Overal bed mobility: Needs Assistance             General bed mobility comments: Deferred. for pt safety/comfort.  Transfers                 General transfer comment: Deferred. for pt safety/comfort. Likely +2 max-total asisst or llift transfers.    Balance Overall balance assessment: Needs assistance                                         ADL either performed or assessed with clinical judgement   ADL Overall ADL's : Needs assistance/impaired                                       General ADL Comments: Bed level max to total assist for ADL tasks such as LB bathing, LB dressing, and toileting 2/2 functional limitations including BLE pain, generalized weakness, and decreased acivity tolerance. Min A for grooming. NPO at time of OT eval. Will continue to assess self-feeding.     Vision Baseline Vision/History: Wears glasses Wears Glasses: At all times Patient Visual Report: No change from baseline       Perception     Praxis      Pertinent Vitals/Pain Pain Assessment: 0-10 Pain  Score: 8  Pain Location: Headache and BLE Pain Descriptors / Indicators: Discomfort;Grimacing;Guarding;Headache Pain Intervention(s): Limited activity within patient's tolerance;Monitored during session;Repositioned     Hand Dominance Right   Extremity/Trunk Assessment Upper Extremity Assessment Upper Extremity Assessment: Generalized weakness(Pt unable to tolerate MMT. Yells out in pain with PROM. Generally weak grossly 2+/5 BUE.)   Lower Extremity Assessment Lower Extremity Assessment: Generalized weakness;Defer to PT evaluation       Communication Communication Communication: No  difficulties   Cognition Arousal/Alertness: Awake/alert Behavior During Therapy: WFL for tasks assessed/performed Overall Cognitive Status: No family/caregiver present to determine baseline cognitive functioning                                 General Comments: Pt A&O to self and limited place ("In a hospital"). Conversational and able to follow 1 step VCs with occasional increased time.   General Comments  Pt BLE notably swollen at ankles.    Exercises Other Exercises Other Exercises: Pt educated on falls prevention strategies for home and hospital. Other Exercises: BUE PROM for shoulder flexion, elbow flexion/extension, wrist flexion/extension and digit flexion/extension.   Shoulder Instructions      Home Living Family/patient expects to be discharged to:: Skilled nursing facility                                 Additional Comments: Patient reports that she has been bed bound for a year, does not sit at the edge of bed, roll over , stand or walk. Per chart review, pt noted to have stood EOB with therapy given Max A ~10 month PTA.      Prior Functioning/Environment Level of Independence: Needs assistance  Gait / Transfers Assistance Needed: bedbound x 1 yr; but able to assist with bed mobility ADL's / Homemaking Assistance Needed: total assist for bathing; can feed herself "but it takes a long time because of the stroke on my right side"            OT Problem List: Decreased strength;Decreased coordination;Cardiopulmonary status limiting activity;Pain;Decreased range of motion;Decreased cognition;Decreased activity tolerance;Decreased safety awareness;Impaired balance (sitting and/or standing);Decreased knowledge of use of DME or AE;Obesity;Impaired UE functional use      OT Treatment/Interventions: Self-care/ADL training;Therapeutic exercise;Therapeutic activities;DME and/or AE instruction;Patient/family education;Cognitive  remediation/compensation;Balance training    OT Goals(Current goals can be found in the care plan section) Acute Rehab OT Goals Patient Stated Goal: To sit up and get out of bed more. OT Goal Formulation: With patient Time For Goal Achievement: 02/26/19 Potential to Achieve Goals: Good ADL Goals Pt Will Perform Grooming: sitting;with adaptive equipment;with min assist(With LRAD PRN for improved safety and functional independence.) Pt Will Perform Upper Body Bathing: sitting;with mod assist;with adaptive equipment(With LRAD PRN for improved safety and functional independence.) Pt Will Transfer to Toilet: with +2 assist;with mod assist;stand pivot transfer;bedside commode  OT Frequency: Min 2X/week   Barriers to D/C:            Co-evaluation              AM-PAC OT "6 Clicks" Daily Activity     Outcome Measure Help from another person eating meals?: A Little Help from another person taking care of personal grooming?: A Little Help from another person toileting, which includes using toliet, bedpan, or urinal?: Total Help from another person bathing (including washing, rinsing, drying)?: Total Help  from another person to put on and taking off regular upper body clothing?: A Lot Help from another person to put on and taking off regular lower body clothing?: A Lot 6 Click Score: 12   End of Session    Activity Tolerance: Patient limited by pain Patient left: in bed;with call bell/phone within reach;with bed alarm set  OT Visit Diagnosis: Other abnormalities of gait and mobility (R26.89);Muscle weakness (generalized) (M62.81);History of falling (Z91.81)                Time: 0902-0920 OT Time Calculation (min): 18 min Charges:  OT General Charges $OT Visit: 1 Visit OT Evaluation $OT Eval Moderate Complexity: 1 Mod OT Treatments $Self Care/Home Management : 8-22 mins  Shara Blazing, M.S., OTR/L Ascom: [phone] 02/12/19, 11:12 AM

## 2019-02-13 ENCOUNTER — Inpatient Hospital Stay: Payer: Medicare HMO

## 2019-02-13 DIAGNOSIS — M79641 Pain in right hand: Secondary | ICD-10-CM

## 2019-02-13 DIAGNOSIS — R4182 Altered mental status, unspecified: Secondary | ICD-10-CM

## 2019-02-13 LAB — FERRITIN: Ferritin: 56 ng/mL (ref 11–307)

## 2019-02-13 LAB — VITAMIN B12: Vitamin B-12: 584 pg/mL (ref 180–914)

## 2019-02-13 LAB — COMPREHENSIVE METABOLIC PANEL
ALT: 51 U/L — ABNORMAL HIGH (ref 0–44)
AST: 78 U/L — ABNORMAL HIGH (ref 15–41)
Albumin: 1.9 g/dL — ABNORMAL LOW (ref 3.5–5.0)
Alkaline Phosphatase: 453 U/L — ABNORMAL HIGH (ref 38–126)
Anion gap: 7 (ref 5–15)
BUN: 16 mg/dL (ref 8–23)
CO2: 24 mmol/L (ref 22–32)
Calcium: 9 mg/dL (ref 8.9–10.3)
Chloride: 108 mmol/L (ref 98–111)
Creatinine, Ser: 1.33 mg/dL — ABNORMAL HIGH (ref 0.44–1.00)
GFR calc Af Amer: 45 mL/min — ABNORMAL LOW (ref >60)
GFR calc non Af Amer: 38 mL/min — ABNORMAL LOW (ref >60)
Glucose, Bld: 89 mg/dL (ref 70–99)
Potassium: 4 mmol/L (ref 3.5–5.1)
Sodium: 139 mmol/L (ref 135–145)
Total Bilirubin: 0.7 mg/dL (ref 0.3–1.2)
Total Protein: 6.4 g/dL — ABNORMAL LOW (ref 6.5–8.1)

## 2019-02-13 LAB — GLUCOSE, CAPILLARY
Glucose-Capillary: 86 mg/dL (ref 70–99)
Glucose-Capillary: 86 mg/dL (ref 70–99)
Glucose-Capillary: 94 mg/dL (ref 70–99)
Glucose-Capillary: 86 mg/dL (ref 70–99)

## 2019-02-13 LAB — CBC
HCT: 25.2 % — ABNORMAL LOW (ref 36.0–46.0)
Hemoglobin: 7.8 g/dL — ABNORMAL LOW (ref 12.0–15.0)
MCH: 26.5 pg (ref 26.0–34.0)
MCHC: 31 g/dL (ref 30.0–36.0)
MCV: 85.7 fL (ref 80.0–100.0)
Platelets: 322 10*3/uL (ref 150–400)
RBC: 2.94 MIL/uL — ABNORMAL LOW (ref 3.87–5.11)
RDW: 18.2 % — ABNORMAL HIGH (ref 11.5–15.5)
WBC: 4.9 10*3/uL (ref 4.0–10.5)
nRBC: 0 % (ref 0.0–0.2)

## 2019-02-13 LAB — IRON AND TIBC
Iron: 25 ug/dL — ABNORMAL LOW (ref 28–170)
Saturation Ratios: 10 % — ABNORMAL LOW (ref 10.4–31.8)
TIBC: 248 ug/dL — ABNORMAL LOW (ref 250–450)
UIBC: 223 ug/dL

## 2019-02-13 LAB — FOLATE: Folate: 9.6 ng/mL (ref >5.9)

## 2019-02-13 LAB — LEVETIRACETAM LEVEL: Levetiracetam Lvl: <1 ug/mL — ABNORMAL LOW (ref 10.0–40.0)

## 2019-02-13 LAB — PREPARE RBC (CROSSMATCH)

## 2019-02-13 MED ORDER — POLYETHYLENE GLYCOL 3350 17 G PO PACK
17.0000 g | PACK | Freq: Every day | ORAL | Status: DC
  Administered 2019-02-13 – 2019-02-14 (×2): 17 g via ORAL
  Filled 2019-02-13 (×3): qty 1

## 2019-02-13 MED ORDER — SODIUM CHLORIDE 0.9% IV SOLUTION: Freq: Once | INTRAVENOUS | Status: AC

## 2019-02-13 MED ORDER — PSYLLIUM 95 % PO PACK
1.0000 | PACK | Freq: Every day | ORAL | Status: DC
  Administered 2019-02-13 – 2019-02-14 (×2): 1 via ORAL
  Filled 2019-02-13 (×3): qty 1

## 2019-02-13 MED ORDER — FERROUS SULFATE 325 (65 FE) MG PO TABS
325.0000 mg | ORAL_TABLET | Freq: Two times a day (BID) | ORAL | Status: DC
  Administered 2019-02-13 – 2019-02-15 (×5): 325 mg via ORAL
  Filled 2019-02-13 (×5): qty 1

## 2019-02-13 NOTE — Progress Notes (Addendum)
Brief history:  78 y.o. female with medical history significant of chronic kidney disease stage III, COPD, type 2 diabetes mellitus, GERD, hypertension, obesity, seizures, pulmonary embolism on anticoagulation, stroke who was brought to the emergency department from her care home after her aide noted her to be unresponsive in bed this morning. Initial laboratory investigations in the emergency department overall unrevealing.  Hematologic and metabolic panels are overall within normal limits.  Patient does have some evidence of white blood cells on her urinalysis but no bacteria.  Imaging survey also unrevealing.  Chest x-ray did not reveal any acute infiltrates.  CT head demonstrates no infarction but evidence of small vessel ischemic disease.   Subjective: Patient was very sleepy this morning.  Also refused to work with physical therapy.  On supplemental oxygen via nasal cannula.  Complaining of right hand pain including arm.  Objective: Vital signs in last 24 hours: Temp:  [97.5 F (36.4 C)-98.4 F (36.9 C)] 97.9 F (36.6 C) (12/31 0753) Pulse Rate:  [75-100] 84 (12/31 0753) Resp:  [15-20] 15 (12/31 0753) BP: (103-129)/(69-80) 128/73 (12/31 0753) SpO2:  [98 %-100 %] 100 % (12/31 0753)  Intake/Output from previous day: 12/30 0701 - 12/31 0700 In: -  Out: 850 [Urine:850] Intake/Output this shift: No intake/output data recorded.  General: Sleepy ENMT: Mucous membranes dry.  Poor dentition Neck: normal, supple, no masses, no thyromegaly Respiratory:  Decreased lung sounds Cardiovascular: Regular rate and rhythm, no murmurs. No extremity edema. 2+ pedal pulses Abdomen: no tenderness, no masses palpated. Bowel sounds positive.  Musculoskeletal: Decreased muscle tone diffusely.  Unable to move lower; right shoulder tenderness;  extremities.  Clubbing noted bilateral lower extremities Skin: no rashes, lesions, ulcers. No induration Neurologic: Unable to assess. Psychiatric: Oriented to  person; somewhat confused appearing  Results for orders placed or performed during the hospital encounter of 02/11/19 (from the past 24 hour(s))  Glucose, capillary     Status: Abnormal   Collection Time: 02/12/19  5:08 PM  Result Value Ref Range   Glucose-Capillary 113 (H) 70 - 99 mg/dL  Glucose, capillary     Status: Abnormal   Collection Time: 02/12/19  9:11 PM  Result Value Ref Range   Glucose-Capillary 102 (H) 70 - 99 mg/dL   Comment 1 Notify RN   Comprehensive metabolic panel     Status: Abnormal   Collection Time: 02/13/19  3:30 AM  Result Value Ref Range   Sodium 139 135 - 145 mmol/L   Potassium 4.0 3.5 - 5.1 mmol/L   Chloride 108 98 - 111 mmol/L   CO2 24 22 - 32 mmol/L   Glucose, Bld 89 70 - 99 mg/dL   BUN 16 8 - 23 mg/dL   Creatinine, Ser 1.33 (H) 0.44 - 1.00 mg/dL   Calcium 9.0 8.9 - 10.3 mg/dL   Total Protein 6.4 (L) 6.5 - 8.1 g/dL   Albumin 1.9 (L) 3.5 - 5.0 g/dL   AST 78 (H) 15 - 41 U/L   ALT 51 (H) 0 - 44 U/L   Alkaline Phosphatase 453 (H) 38 - 126 U/L   Total Bilirubin 0.7 0.3 - 1.2 mg/dL   GFR calc non Af Amer 38 (L) >60 mL/min   GFR calc Af Amer 45 (L) >60 mL/min   Anion gap 7 5 - 15  CBC     Status: Abnormal   Collection Time: 02/13/19  3:30 AM  Result Value Ref Range   WBC 4.9 4.0 - 10.5 K/uL   RBC 2.94 (  L) 3.87 - 5.11 MIL/uL   Hemoglobin 7.8 (L) 12.0 - 15.0 g/dL   HCT 25.2 (L) 36.0 - 46.0 %   MCV 85.7 80.0 - 100.0 fL   MCH 26.5 26.0 - 34.0 pg   MCHC 31.0 30.0 - 36.0 g/dL   RDW 18.2 (H) 11.5 - 15.5 %   Platelets 322 150 - 400 K/uL   nRBC 0.0 0.0 - 0.2 %  Iron and TIBC     Status: Abnormal   Collection Time: 02/13/19  3:30 AM  Result Value Ref Range   Iron 25 (L) 28 - 170 ug/dL   TIBC 248 (L) 250 - 450 ug/dL   Saturation Ratios 10 (L) 10.4 - 31.8 %   UIBC 223 ug/dL  Ferritin     Status: None   Collection Time: 02/13/19  3:30 AM  Result Value Ref Range   Ferritin 56 11 - 307 ng/mL  Vitamin B12     Status: None   Collection Time: 02/13/19   3:30 AM  Result Value Ref Range   Vitamin B-12 584 180 - 914 pg/mL  Folate     Status: None   Collection Time: 02/13/19  3:30 AM  Result Value Ref Range   Folate 9.6 >5.9 ng/mL  Glucose, capillary     Status: None   Collection Time: 02/13/19  7:55 AM  Result Value Ref Range   Glucose-Capillary 86 70 - 99 mg/dL  Glucose, capillary     Status: None   Collection Time: 02/13/19 11:37 AM  Result Value Ref Range   Glucose-Capillary 86 70 - 99 mg/dL  Prepare RBC     Status: None (Preliminary result)   Collection Time: 02/13/19  2:27 PM  Result Value Ref Range   Order Confirmation PENDING     Studies/Results: CT Head Wo Contrast  Result Date: 02/11/2019 CLINICAL DATA:  Altered mental status/unresponsive. EXAM: CT HEAD WITHOUT CONTRAST TECHNIQUE: Contiguous axial images were obtained from the base of the skull through the vertex without intravenous contrast. COMPARISON:  December 24, 2018 FINDINGS: Brain: Mild diffuse atrophy is stable. There is no intracranial mass, hemorrhage, extra-axial fluid collection, or midline shift. There is mild small vessel disease in the centra semiovale bilaterally. No acute appearing infarct is evident on this study. Vascular: No hyperdense vessel. There is calcification in each carotid siphon region. There is slight calcification in the distal left vertebral artery. Skull: The bony calvarium appears intact. Sinuses/Orbits: There is mucosal thickening in several ethmoid air cells. Other visualized paranasal sinuses are clear. Orbits appear symmetric bilaterally. Other: There is diffuse opacification of mastoid air cells on the right. Mastoids on the left are clear. IMPRESSION: Mild atrophy with mild periventricular small vessel disease. No acute infarct. No mass or hemorrhage. There are foci of arterial vascular calcification. There is mucosal thickening in several ethmoid air cells. There is diffuse opacification of mastoid air cells on the right, a finding present on  previous study as well. Electronically Signed   By: Lowella Grip III M.D.   On: 02/11/2019 10:08   MR BRAIN WO CONTRAST  Result Date: 02/11/2019 CLINICAL DATA:  Ataxia.  Altered mental status. EXAM: MRI HEAD WITHOUT CONTRAST TECHNIQUE: Multiplanar, multiecho pulse sequences of the brain and surrounding structures were obtained without intravenous contrast. COMPARISON:  Head CT same day. FINDINGS: Brain: Diffusion imaging suffers from some motion degradation but does not show any acute or subacute infarction. The brainstem shows chronic small-vessel change affecting the pons. There are moderate chronic appearing  small vessel ischemic changes of the hemispheric white matter. Few old small vessel infarctions in the right basal ganglia. No large vessel territory infarction. No mass lesion, hemorrhage, hydrocephalus or extra-axial collection. Vascular: Major vessels at the base of the brain show flow. Skull and upper cervical spine: Negative Sinuses/Orbits: Paranasal sinuses are clear. Extensive mastoid effusion on the right could possibly be symptomatic. Other: None IMPRESSION: No acute intracranial finding. Chronic small-vessel ischemic changes affecting the pons, right basal ganglia and cerebral hemispheric white matter. Extensive mastoid effusion on the right which could possibly be symptomatic. Electronically Signed   By: Nelson Chimes M.D.   On: 02/11/2019 13:34   DG Chest Port 1 View  Result Date: 02/11/2019 CLINICAL DATA:  Altered mental status and hypertension. EXAM: PORTABLE CHEST 1 VIEW COMPARISON:  January 05, 2019 FINDINGS: There is atelectatic change in the lung bases. There is no edema or consolidation. Heart is borderline enlarged with pulmonary vascularity normal. Aorta is prominent with aortic atherosclerosis. No adenopathy. Bones are osteoporotic. There is degenerative change in each shoulder. IMPRESSION: Bibasilar atelectasis. No edema or consolidation. Stable cardiac silhouette. Aortic  prominence may be indicative of chronic hypertension. Aortic Atherosclerosis (ICD10-I70.0). Bones osteoporotic. Electronically Signed   By: Lowella Grip III M.D.   On: 02/11/2019 10:22   DG Hand Complete Right  Result Date: 02/13/2019 CLINICAL DATA:  Right hand pain, no known injury, initial encounter EXAM: RIGHT HAND - COMPLETE 3+ VIEW COMPARISON:  None. FINDINGS: No acute fracture or dislocation is noted. Mild radiocarpal degenerative change is seen. Mild interphalangeal degenerative changes are noted as well as changes at the first [facility] and MCP joints. No bony erosive changes are seen. No other focal abnormality is noted. IMPRESSION: Mild osteoarthritic changes without acute abnormality. Electronically Signed   By: [name] Catalina M.D.   On: 02/13/2019 10:42   ECHOCARDIOGRAM COMPLETE  Result Date: 02/12/2019   ECHOCARDIOGRAM REPORT   Patient Name:   JENAVIVE ADDERLY Date of Exam: 02/12/2019 Medical Rec #:  [id number]      Height: Accession #:    [id number]     Weight: Date of Birth:  1941/02/09      BSA: Patient Age:    62 years       BP:           128/81 mmHg Patient Gender: F              HR:           95 bpm. Exam Location:  [facility] Procedure: 2D Echo, Color Doppler, Cardiac Doppler and Saline Contrast Bubble            Study Indications:     Stroke 434.91  History:         Patient has prior history of Echocardiogram examinations, most                  recent 07/16/2018. COPD and Stroke; Risk Factors:Hypertension and                  Diabetes. Tobacco abuse.  Sonographer:     Sherrie Sport RDCS (AE) Referring Phys:  [id number] Sidney Ace Diagnosing Phys: Kate Sable MD  Sonographer Comments: No parasternal window, no apical window and suboptimal subcostal window. Image acquisition challenging due to COPD. IMPRESSIONS  1. Left ventricular ejection fraction, by visual estimation, is 55 to 60%. The left ventricle has normal function. There is no left ventricular hypertrophy.  2. Left ventricular  diastolic function could not  be evaluated.  3. The left ventricle has no regional wall motion abnormalities.  4. Global right ventricle has normal systolic function.The right ventricular size is normal. Right vetricular wall thickness was not assessed.  5. Left atrial size was normal.  6. Right atrial size was normal.  7. The mitral valve was not well visualized. No evidence of mitral valve regurgitation.  8. The tricuspid valve is normal in structure.  9. Aortic valve regurgitation not assessed. 10. The aortic valve was not well visualized. Aortic valve regurgitation not assessed. 11. Pulmonic regurgitation not assessed. 12. The pulmonic valve was not well visualized. Pulmonic valve regurgitation not assessed. 13. The aortic root was not well visualized. 14. The inferior vena cava is normal in size with greater than 50% respiratory variability, suggesting right atrial pressure of 3 mmHg. FINDINGS  Left Ventricle: Left ventricular ejection fraction, by visual estimation, is 55 to 60%. The left ventricle has normal function. The left ventricle has no regional wall motion abnormalities. There is no left ventricular hypertrophy. Left ventricular diastolic function could not be evaluated. Right Ventricle: The right ventricular size is normal. Right vetricular wall thickness was not assessed. Global RV systolic function is has normal systolic function. Left Atrium: Left atrial size was normal in size. Right Atrium: Right atrial size was normal in size Pericardium: There is no evidence of pericardial effusion. Mitral Valve: The mitral valve was not well visualized. No evidence of mitral valve regurgitation. Tricuspid Valve: The tricuspid valve is normal in structure. Tricuspid valve regurgitation is trivial. Aortic Valve: The aortic valve was not well visualized. Aortic valve regurgitation not assessed. Pulmonic Valve: The pulmonic valve was not well visualized. Pulmonic valve regurgitation not assessed. Pulmonic  regurgitation not assessed. Aorta: The aortic root was not well visualized. Venous: The inferior vena cava is normal in size with greater than 50% respiratory variability, suggesting right atrial pressure of 3 mmHg. IAS/Shunts: No atrial level shunt detected by color flow Doppler. Agitated saline contrast was given intravenously to evaluate for intracardiac shunting. Saline contrast bubble study was negative, with no evidence of any interatrial shunt.  Kate Sable MD Electronically signed by Kate Sable MD Signature Date/Time: 02/12/2019/1:44:18 PM    Final     Scheduled Meds: . sodium chloride   Intravenous Once  . apixaban  5 mg Oral BID  . ascorbic acid  500 mg Oral Daily  . atorvastatin  80 mg Oral QPM  . cholecalciferol  1,000 Units Oral Daily  . ferrous sulfate  325 mg Oral BID WC  . FLUoxetine  40 mg Oral Daily  . insulin aspart  0-15 Units Subcutaneous TID WC  . insulin aspart  0-5 Units Subcutaneous QHS  . lacosamide  100 mg Oral BID  . levETIRAcetam  1,500 mg Oral Daily  . loratadine  10 mg Oral QHS  . multivitamin with minerals  1 tablet Oral Daily  . pantoprazole  40 mg Oral Daily  . polyethylene glycol  17 g Oral Daily  . psyllium  1 packet Oral Daily  . zinc sulfate  220 mg Oral Daily   Continuous Infusions: . sodium chloride 75 mL/hr at 02/13/19 1144   PRN Meds:acetaminophen **OR** acetaminophen (TYLENOL) oral liquid 160 mg/5 mL **OR** acetaminophen, ipratropium-albuterol, nitroGLYCERIN, senna-docusate  Assessment/Plan:  Acute encephalopathy Unclear etiology -Suspected to be due to seizure MRI brain: No acute intracranial finding. Chronic small-vessel ischemic changes affecting the pons, right basal ganglia and cerebral hemispheric white matter.   Neurology consulted-continue Keppra and Vimpat.  Appreciate neurology recommendation Echocardiogramm -->  1. Left ventricular ejection fraction, by visual estimation, is 55 to 60%. The left ventricle has normal  function. There is no left ventricular hypertrophy.  2. Left ventricular diastolic function could not be evaluated.  3. The left ventricle has no regional wall motion abnormalities - Frequent neuro checks - PT, OT --> recommended skilled nursing facility SLP consult--passed swallowing eval Continue seizure, aspiration, fall precautions -Ammonia normal with mild elevation of LFTs (trending down) -Initial blood culture 1 out of 2 showing GPC on 02/12/2019; repeat blood culture showing from 02/12/2019 is pending.  May start empiric.  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 Continue home Vimpat and Keppra per neurology  History of pulmonary embolism Chronic anticoagulation PE diagnosed October 2020 Continue Eliquis at home dose  COPD Does not appear to be in exacerbation Continue home nebulizer regimen  Hyperlipidemia Continue Lipitor  Anemia: Patient's hemoglobin found to be 7.8 this morning -While she is chronic knee anemic her hemoglobin seems to have dropped 1 full point from yesterday -We will transfuse patient 1 unit of PRBC today -Iron panel shows mixed picture of iron deficiency anemia and anemia of chronic disease while B12 and folate appears to be normal -FOBT ordered and pending -If patient's H&H further drops may have to consider stopping Eliquis.  The same time it might be challenging due to the fact that patient developed recent pulmonary embolism following Covid infection  History of ESBL Formerly on Invanz 1 g daily Currently no indication For urine culture education  -UA on this admission fairly  normal  Depression Continue Prozac  GERD Continue home PPI  Right hand pain: Was complaining of this morning -Range of motion appears to be compromised -X-ray of the right hand including arm shows no fracture but degenerative arthritis -pain medication as needed -PT OT  Constipation: Patient complained this morning that she has not had a bowel movement in a couple of days -Most likely also has been having decreased p.o. intake including fiber and fluid -Does not have any abdominal pain and physical exam also benign -We will place patient on bowel regimen -Monitor  Mobility: Even though patient refused physical therapy today, PT OT to be attempted to work with patient.   DVT prophylaxis: Eliquis Code Status: Full code Family Communication: none Disposition Plan: Anticipate SNF  Consults called: Neurology- Dr. Doy Mince Admission status: Inpatient  LOS: 2 days   Moataz Tavis Izetta Dakin

## 2019-02-13 NOTE — TOC Progression Note (Signed)
Transition of Care [facility]) - Progression Note    Patient Details  Name: Katherine [name] MRN: [id number] Date of Birth: 1940-03-03  Transition of Care [facility]) CM/SW Contact  Shelbie Ammons, RN Phone Number: 02/13/2019, 3:26 PM  Clinical Narrative:     RNCM updated Claiborne Billings at [facility], patient is awaiting an occult blood test due to decreased Hgb. Once this is completed will likely d/c back to [facility].     Expected Discharge Plan: [place] Barriers to Discharge: Continued Medical Work up  Expected Discharge Plan and Services Expected Discharge Plan: [place] Choice: [place] Living arrangements for the past 2 months: [place]                                       Social Determinants of Health (SDOH) Interventions    Readmission Risk Interventions Readmission Risk Prevention Plan 11/26/2018 11/22/2018 07/16/2018  Transportation Screening Complete Complete Complete  Medication Review Press photographer) Complete Complete Complete  PCP or Specialist appointment within 3-5 days of discharge (No Data) - Complete  PCP/Specialist Appt Not Complete comments - - -  HRI or Home Care Consult (No Data) - Complete  SW Recovery Care/Counseling Consult Complete - Not Complete  SW Consult Not Complete Comments - - na  Palliative Care Screening - - Not Applicable  Medication Reconcilation ([place] Complete Complete Complete  Some recent data might be hidden

## 2019-02-13 NOTE — Progress Notes (Signed)
Pt has been drinking some today. Poor food intake. ivfs infusing.  Wanted to sleep today. Receiving 1 unit of prbcs  Due to hgb of 7.8. tol well.  Voiding. Unable to have bm  Yet to get  Stool occult. Lax given earlier in the day. On bedpan several times with urge to defecate  But only passed gas.

## 2019-02-13 NOTE — Plan of Care (Signed)
  Problem: Education: Goal: Knowledge of disease or condition will improve Outcome: Not Progressing Goal: Knowledge of secondary prevention will improve Outcome: Not Progressing   Problem: Coping: Goal: Will verbalize positive feelings about self Outcome: Not Progressing   Problem: Health Behavior/Discharge Planning: Goal: Ability to manage health-related needs will improve Outcome: Not Progressing   Problem: Self-Care: Goal: Ability to participate in self-care as condition permits will improve Outcome: Not Progressing Goal: Verbalization of feelings and concerns over difficulty with self-care will improve Outcome: Progressing Goal: Ability to communicate needs accurately will improve Outcome: Progressing   Problem: Nutrition: Goal: Risk of aspiration will decrease Outcome: Not Progressing Goal: Dietary intake will improve Outcome: Not Progressing   Problem: Ischemic Stroke/TIA Tissue Perfusion: Goal: Complications of ischemic stroke/TIA will be minimized Outcome: Progressing   Problem: Education: Goal: Knowledge of General Education information will improve Description: Including pain rating scale, medication(s)/side effects and non-pharmacologic comfort measures Outcome: Progressing   Problem: Clinical Measurements: Goal: Ability to maintain clinical measurements within normal limits will improve Outcome: Not Progressing Goal: Will remain free from infection Outcome: Progressing

## 2019-02-13 NOTE — Progress Notes (Signed)
PT Cancellation Note  Patient Details Name: Katherine [name] MRN: [id number] DOB: [birth year]   Cancelled Treatment:    Reason Eval/Treat Not Completed: Other (comment). Treatment attempted, however pt very lethargic and won't open her eyes. When asked to open eyes she said no. She also refuses therapy this date. Will re-attempt another day.   [place] Karl 02/13/2019, 2:19 PM Greggory Stallion, PT, DPT [phone]

## 2019-02-14 ENCOUNTER — Inpatient Hospital Stay: Payer: Medicare HMO

## 2019-02-14 ENCOUNTER — Encounter: Payer: Self-pay | Admitting: Internal Medicine

## 2019-02-14 LAB — CBC
HCT: 28.3 % — ABNORMAL LOW (ref 36.0–46.0)
Hemoglobin: 8.9 g/dL — ABNORMAL LOW (ref 12.0–15.0)
MCH: 26.1 pg (ref 26.0–34.0)
MCHC: 31.4 g/dL (ref 30.0–36.0)
MCV: 83 fL (ref 80.0–100.0)
Platelets: 305 10*3/uL (ref 150–400)
RBC: 3.41 MIL/uL — ABNORMAL LOW (ref 3.87–5.11)
RDW: 17.2 % — ABNORMAL HIGH (ref 11.5–15.5)
WBC: 4.7 10*3/uL (ref 4.0–10.5)
nRBC: 0 % (ref 0.0–0.2)

## 2019-02-14 LAB — BRAIN NATRIURETIC PEPTIDE: B Natriuretic Peptide: 117 pg/mL — ABNORMAL HIGH (ref 0.0–100.0)

## 2019-02-14 LAB — GLUCOSE, CAPILLARY
Glucose-Capillary: 79 mg/dL (ref 70–99)
Glucose-Capillary: 72 mg/dL (ref 70–99)
Glucose-Capillary: 92 mg/dL (ref 70–99)
Glucose-Capillary: 95 mg/dL (ref 70–99)
Glucose-Capillary: 73 mg/dL (ref 70–99)
Glucose-Capillary: 83 mg/dL (ref 70–99)

## 2019-02-14 LAB — BASIC METABOLIC PANEL
Anion gap: 7 (ref 5–15)
BUN: 11 mg/dL (ref 8–23)
CO2: 23 mmol/L (ref 22–32)
Calcium: 8.9 mg/dL (ref 8.9–10.3)
Chloride: 108 mmol/L (ref 98–111)
Creatinine, Ser: 1.11 mg/dL — ABNORMAL HIGH (ref 0.44–1.00)
GFR calc Af Amer: 55 mL/min — ABNORMAL LOW (ref >60)
GFR calc non Af Amer: 48 mL/min — ABNORMAL LOW (ref >60)
Glucose, Bld: 76 mg/dL (ref 70–99)
Potassium: 3.8 mmol/L (ref 3.5–5.1)
Sodium: 138 mmol/L (ref 135–145)

## 2019-02-14 LAB — CULTURE, BLOOD (ROUTINE X 2): Special Requests: ADEQUATE

## 2019-02-14 LAB — BLOOD GAS, ARTERIAL
Acid-base deficit: 0.2 mmol/L (ref 0.0–2.0)
Bicarbonate: 24.8 mmol/L (ref 20.0–28.0)
FIO2: 0.28
O2 Saturation: 95.6 %
Patient temperature: 37
pCO2 arterial: 41 mmHg (ref 32.0–48.0)
pH, Arterial: 7.39 (ref 7.350–7.450)
pO2, Arterial: 80 mmHg — ABNORMAL LOW (ref 83.0–108.0)

## 2019-02-14 LAB — TYPE AND SCREEN
ABO/RH(D): O POS
Antibody Screen: NEGATIVE
Unit division: 0

## 2019-02-14 LAB — PHOSPHORUS: Phosphorus: 2.9 mg/dL (ref 2.5–4.6)

## 2019-02-14 LAB — BPAM RBC
Blood Product Expiration Date: 202101022359
ISSUE DATE / TIME: 202012311647
Unit Type and Rh: 9500

## 2019-02-14 LAB — TROPONIN I (HIGH SENSITIVITY): Troponin I (High Sensitivity): 6 ng/L (ref <18)

## 2019-02-14 LAB — CKMB (ARMC ONLY): CK, MB: 0.7 ng/mL (ref 0.5–5.0)

## 2019-02-14 LAB — MAGNESIUM: Magnesium: 1.6 mg/dL — ABNORMAL LOW (ref 1.7–2.4)

## 2019-02-14 MED ORDER — MAGNESIUM SULFATE 2 GM/50ML IV SOLN
2.0000 g | Freq: Once | INTRAVENOUS | Status: AC
  Administered 2019-02-14: 2 g via INTRAVENOUS
  Filled 2019-02-14: qty 50

## 2019-02-14 MED ORDER — DEXTROSE 50 % IV SOLN: 25.0000 mL | Freq: Once | INTRAVENOUS | Status: DC

## 2019-02-14 MED ORDER — DEXTROSE 50 % IV SOLN
INTRAVENOUS | Status: AC
  Filled 2019-02-14: qty 50

## 2019-02-14 MED ORDER — DEXTROSE 50 % IV SOLN
12.5000 g | INTRAVENOUS | Status: AC
  Administered 2019-02-14: 12.5 g via INTRAVENOUS

## 2019-02-14 NOTE — Significant Event (Addendum)
Rapid Response Event Note  Overview: Time Called: 0927 Arrival Time: 0929 Event Type: Neurologic  Initial Focused Assessment:  Patient is responsive to pain, not following commands. Acute change per bedside RN. VS WNL. PERRLA. Withdraws to threat. Lungs: ronchorous in bases bilaterally. Strong pulses peripherally. CBG low at 72.  Interventions:  ABG, CXR, tropnins, BNP. 1/2 amp D50 for symptoms consistent with hypoglycemia.  Plan of Care (if not transferred):  - D50W and recheck CBG. - MD to assess med list.  - CTM for changes in VS. - Stop IVFs.  Event Summary: Name of Physician Notified: Dr. Carmon Sails at 0930    at    Outcome: Stayed in room and stabalized  Event End Time: [place]

## 2019-02-14 NOTE — Progress Notes (Signed)
Responded to rapid.  ABG obtained. Duoneb given. Patient 100% on 2lpm [place].

## 2019-02-14 NOTE — Plan of Care (Signed)
Had to call a rapid response on her at approx 10 am.  Pt would respond to my questions and progressively became less responsive. Breathing - very irregular.   She would stare off into space and not answer questions.  Has Hx of seizures, previous COVID diagnosis,  PE in October and Hx of stroke.  ABG ordered and ok. Chest xray, BNP ordered.  Blood sugar WNL.  Did breathing tx and gave 1/2 AMP of D50.  Pt became more responsive and took all medications with water.

## 2019-02-14 NOTE — Progress Notes (Signed)
Nurse reports mag level 1.6 with no replacement previously given.  Given seizure disorder, 2g IV magnesium ordered for now and will recheck in am

## 2019-02-14 NOTE — Progress Notes (Signed)
[facility] PROGRESS NOTE  [name] [id number] DOB: 1940-07-19 DOA: 02/11/2019 PCP: Valerie Roys, DO  Assessment/Plan: Acute encephalopathy Unclear etiology Per documentation the patient was less her baseline yesterday however this is somewhat unclear Urinalysis demonstrates some evidence of white blood cells but no bacteria Encephalopathy could be metabolic in origin however higher suspicion for neurologic cause CT head does reveal some small vessel ischemic disease however overall unrevealing and  stat head ct was done today as pt was found altered and responsive to sternal rub. I suspect this pt's covid-infection is contributing to her encephalopathy.   Siezure disorder Breakthrough seizures are also on differential Continue home Vimpat and Keppra Pt's keppra level was less than one on admission.  Vimpat level was 12.2. Neurology on board.   Anemia: Patient's hemoglobin found to be 7.8 . -While she is chronic anemic her hemoglobin seems to have dropped 1 full point from yesterday -We will transfuse patient 1 unit of PRBC today -Iron panel shows mixed picture of iron deficiency anemia and anemia of chronic disease while B12 and folate appears to be normal -FOBT ordered and pending -If patient's H&H further drops may have to consider stopping Eliquis.  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    Procedures:  None  Antibiotics:  None.  HPI/Subjective: Katherine [name] is a 79 y.o. female with medical history significant of chronic kidney disease stage III, COPD, type 2 diabetes mellitus, GERD, hypertension, obesity, seizures, pulmonary embolism on anticoagulation, stroke who was brought to the emergency department from her care home after her aide noted her to be unresponsive in bed this morning.  Patient is altered on my evaluation is unable to provide history.  History is obtained by reviewing medical record, speaking with the ED team.  Per documentation the patient was apparently normal yesterday though unclear exactly what time.  At baseline she is not ambulatory but is able to communicate her needs with the assisted living facility staff.  The patient had a positive coronavirus in early November.   Today rapid response was called as pt was found unresponsive and few seconds per nurse she woke up when suctions Cannula in her throat. Etiology for her episodes of being  Altered are multiple, maybe this ai a seizure, or maybe it is related to her sleep apnea. Per nurse pt received 2 units of prbc and her echo shows EF of 55-60%.    Objective: Vitals:   02/14/19 0853 02/14/19 1005  BP: 134/79   Pulse: 79   Resp: (!) 28   Temp: (!) 97.1 F (36.2 C)   SpO2: 98% 100%    Intake/Output Summary (Last 24 hours) at 02/14/2019 1400 Last data filed at 02/14/2019 0840 Gross per 24 hour  Intake 410 ml  Output 3 ml  Net 407 ml   Filed Weights   02/11/19 0934  Weight: 100.6 kg    Exam:  General: Alert/ Awake and is confused. ENMT:Mucous membranes dry. Poor dentition Neck:normal, supple, no masses, no thyromegaly Respiratory: Decreased lung sounds Cardiovascular:Regular rate and  rhythm, no murmurs. No extremity edema. 2+ pedal pulses Abdomen:no tenderness, no masses palpated. Bowel sounds positive.  Musculoskeletal:Decreased muscle tone diffusely. Unable to move lower;  right shoulder tenderness;  extremities. Clubbing noted bilateral lower extremities Skin:no rashes, lesions, ulcers. No induration Neurologic:Unable to assess. Psychiatric:Oriented to person; somewhat confused appearing  Data Reviewed: Basic Metabolic Panel: Recent Labs  Lab 02/11/19 0945 02/13/19 0330 02/14/19 0640 02/14/19 1109  NA 133* 139 138  --   K 4.0 4.0 3.8  --   CL 103 108 108  --   CO2 23 24 23   --   GLUCOSE 108* 89 76  --   BUN 23 16 11   --   CREATININE 1.62* 1.33* 1.11*  --   CALCIUM 8.9 9.0 8.9  --   MG  --   --   --  1.6*  PHOS  --   --   --  2.9   Liver Function Tests: Recent Labs  Lab 02/11/19 0945 02/13/19 0330  AST 103* 78*  ALT 61* 51*  ALKPHOS 539* 453*  BILITOT 0.6 0.7  PROT 7.3 6.4*  ALBUMIN 2.2* 1.9*   No results for input(s): LIPASE, AMYLASE in the last 168 hours. Recent Labs  Lab 02/12/19 0627  AMMONIA 18   CBC: Recent Labs  Lab 02/11/19 0945 02/13/19 0330 02/14/19 0640  WBC 6.8 4.9 4.7  NEUTROABS 4.4  --   --   HGB 8.8* 7.8* 8.9*  HCT 27.7* 25.2* 28.3*  MCV 82.9 85.7 83.0  PLT 392 322 305   Cardiac Enzymes: Recent Labs  Lab 02/14/19 1109  CKMB 0.7   BNP (last 3 results) Recent Labs    01/02/19 2303 02/11/19 0945 02/14/19 1109  BNP 108.0* 76.0 117.0*    ProBNP (last 3 results) No results for input(s): PROBNP in the last 8760 hours.  CBG: Recent Labs  Lab 02/13/19 2057 02/14/19 0855 02/14/19 0940 02/14/19 1045 02/14/19 1226  GLUCAP 86 79 72 92 95    Recent Results (from the past 240 hour(s))  Blood Culture (routine x 2)     Status: None (Preliminary result)   Collection Time: 02/11/19  9:45 AM   Specimen: BLOOD  Result Value Ref Range Status   Specimen Description BLOOD BLOOD RIGHT ARM  Final   Special Requests   Final    BOTTLES DRAWN AEROBIC AND ANAEROBIC Blood Culture adequate volume   Culture   Final    NO GROWTH 3 DAYS Performed at [facility], [address] [place],  [place] [address]    Report Status PENDING  Incomplete  Blood Culture (routine x 2)     Status: Abnormal   Collection Time: 02/11/19  9:46 AM   Specimen: BLOOD  Result Value Ref Range Status   Specimen Description BLOOD BLOOD LEFT HAND  Final   Special Requests   Final    BOTTLES DRAWN AEROBIC AND ANAEROBIC Blood Culture adequate volume   Culture  Setup Time   Final    GRAM POSITIVE COCCI ANAEROBIC BOTTLE ONLY CRITICAL RESULT CALLED TO, READ BACK BY AND VERIFIED WITH: WALID NAZARI AT [id number] ON 02/12/19 RH    Culture (A)  Final    STAPHYLOCOCCUS SPECIES (COAGULASE NEGATIVE) THE SIGNIFICANCE OF ISOLATING THIS ORGANISM FROM A SINGLE SET OF BLOOD CULTURES WHEN MULTIPLE SETS ARE DRAWN IS UNCERTAIN. PLEASE NOTIFY THE MICROBIOLOGY DEPARTMENT WITHIN ONE WEEK IF SPECIATION AND SENSITIVITIES ARE REQUIRED.    Report Status 02/13/2019 FINAL  Final  Urine culture  Status: None   Collection Time: 02/11/19 10:40 AM   Specimen: Urine, Catheterized  Result Value Ref Range Status   Specimen Description   Final    URINE, CATHETERIZED Performed at [facility], [address], [place], [place] [address]    Special Requests   Final    NONE Performed at [facility], [address], [place], [place] [address]    Culture   Final    NO GROWTH Performed at [facility], [place] [address], [place], [place] [address]    Report Status 02/12/2019 FINAL  Final  SARS CORONAVIRUS 2 (TAT 6-24 HRS) Nasopharyngeal Nasopharyngeal Swab     Status: None   Collection Time: 02/11/19  1:51 PM   Specimen: Nasopharyngeal Swab  Result Value Ref Range Status   SARS Coronavirus 2 NEGATIVE NEGATIVE Final    Comment: (NOTE) SARS-CoV-2 target nucleic acids are NOT DETECTED. The SARS-CoV-2 RNA is generally detectable in upper and lower respiratory specimens during the acute phase of infection. Negative results do not preclude SARS-CoV-2 infection, do not rule out co-infections with other pathogens, and  should not be used as the sole basis for treatment or other patient management decisions. Negative results must be combined with clinical observations, patient history, and epidemiological information. The expected result is Negative. Fact Sheet for Patients: SugarRoll.be Fact Sheet for Healthcare Providers: https://www.woods-mathews.com/ This test is not yet approved or cleared by the [place] FDA and  has been authorized for detection and/or diagnosis of SARS-CoV-2 by FDA under an Emergency Use Authorization (EUA). This EUA will remain  in effect (meaning this test can be used) for the duration of the COVID-19 declaration under Section 56 4(b)(1) of the Act, 21 U.S.C. section 360bbb-3(b)(1), unless the authorization is terminated or revoked sooner. Performed at [facility], [place] [address], [place], [place] [address]   CULTURE, BLOOD (ROUTINE X 2) w Reflex to ID Panel     Status: None (Preliminary result)   Collection Time: 02/12/19 10:16 AM   Specimen: BLOOD  Result Value Ref Range Status   Specimen Description BLOOD BLOOD RIGHT HAND  Final   Special Requests   Final    BOTTLES DRAWN AEROBIC AND ANAEROBIC Blood Culture adequate volume   Culture   Final    NO GROWTH 2 DAYS Performed at [facility], [address], [place], [place] [address]    Report Status PENDING  Incomplete  CULTURE, BLOOD (ROUTINE X 2) w Reflex to ID Panel     Status: None (Preliminary result)   Collection Time: 02/12/19 10:16 AM   Specimen: BLOOD  Result Value Ref Range Status   Specimen Description BLOOD BLOOD RIGHT HAND  Final   Special Requests   Final    BOTTLES DRAWN AEROBIC AND ANAEROBIC Blood Culture results may not be optimal due to an excessive volume of blood received in culture bottles   Culture   Final    NO GROWTH 2 DAYS Performed at [facility], [address], [place],  [address]    Report Status PENDING   Incomplete     Studies: CT HEAD WO CONTRAST  Result Date: 02/14/2019 CLINICAL DATA:  Altered mental status. EXAM: CT HEAD WITHOUT CONTRAST TECHNIQUE: Contiguous axial images were obtained from the base of the skull through the vertex without intravenous contrast. COMPARISON:  February 11, 2019. FINDINGS: Brain: Mild diffuse cortical atrophy is noted. Mild chronic ischemic white matter disease is noted. No mass effect or midline  shift is noted. Ventricular size is within normal limits. There is no evidence of mass lesion, hemorrhage or acute infarction. Vascular: No hyperdense vessel or unexpected calcification. Skull: Normal. Negative for fracture or focal lesion. Sinuses/Orbits: No acute finding. Other: Fluid is noted in right mastoid air cells. IMPRESSION: Mild diffuse cortical atrophy. Mild chronic ischemic white matter disease. No acute intracranial abnormality seen. Electronically Signed   By: Marijo Conception M.D.   On: 02/14/2019 11:36   DG Chest Port 1 View  Result Date: 02/14/2019 CLINICAL DATA:  Shortness of breath. EXAM: PORTABLE CHEST 1 VIEW COMPARISON:  February 11, 2019. FINDINGS: Stable cardiomediastinal silhouette. Atherosclerosis of thoracic aorta is noted. No pneumothorax is noted. Mild bibasilar subsegmental atelectasis or scarring is noted. Minimal right pleural effusion or pleural thickening is noted. Bony thorax is unremarkable. IMPRESSION: Aortic atherosclerosis. Stable mild bibasilar subsegmental atelectasis or scarring. Minimal right pleural effusion or pleural thickening is noted. Electronically Signed   By: Marijo Conception M.D.   On: 02/14/2019 10:12   DG Hand Complete Right  Result Date: 02/13/2019 CLINICAL DATA:  Right hand pain, no known injury, initial encounter EXAM: RIGHT HAND - COMPLETE 3+ VIEW COMPARISON:  None. FINDINGS: No acute fracture or dislocation is noted. Mild radiocarpal degenerative change is seen. Mild interphalangeal degenerative changes are noted as well  as changes at the first [facility] Hights and MCP joints. No bony erosive changes are seen. No other focal abnormality is noted. IMPRESSION: Mild osteoarthritic changes without acute abnormality. Electronically Signed   By: [name] Catalina M.D.   On: 02/13/2019 10:42    Scheduled Meds: . apixaban  5 mg Oral BID  . ascorbic acid  500 mg Oral Daily  . atorvastatin  80 mg Oral QPM  . cholecalciferol  1,000 Units Oral Daily  . dextrose  25 mL Intravenous Once  . dextrose      . ferrous sulfate  325 mg Oral BID WC  . FLUoxetine  40 mg Oral Daily  . insulin aspart  0-15 Units Subcutaneous TID WC  . insulin aspart  0-5 Units Subcutaneous QHS  . lacosamide  100 mg Oral BID  . levETIRAcetam  1,500 mg Oral Daily  . loratadine  10 mg Oral QHS  . multivitamin with minerals  1 tablet Oral Daily  . pantoprazole  40 mg Oral Daily  . polyethylene glycol  17 g Oral Daily  . psyllium  1 packet Oral Daily  . zinc sulfate  220 mg Oral Daily   Continuous Infusions: . sodium chloride Stopped (02/14/19 1150)    Active Problems:   Right hand pain   Acute encephalopathy   Altered mental status    Time spent: 35 minutes.     [place] [facility] If 7PM-7AM, please contact night-coverage at [email or web address], password [facility] 02/14/2019, 2:00 PM  LOS: 3 days

## 2019-02-14 NOTE — TOC Progression Note (Signed)
Transition of Care [facility]) - Progression Note    Patient Details  Name: Katherine [name] MRN: [id number] Date of Birth: [birth year]  Transition of Care [facility]) CM/SW [place], RN Phone Number: 02/14/2019, 1:16 PM  Clinical Narrative:     Attempted to call patient to review the IM, unable to reach the patient  Expected Discharge Plan: [place] Barriers to Discharge: Continued Medical Work up  Expected Discharge Plan and Services Expected Discharge Plan: [place] Choice: [place] arrangements for the past 2 months: [place]                                       Social Determinants of Health (SDOH) Interventions    Readmission Risk Interventions Readmission Risk Prevention Plan 11/26/2018 11/22/2018 07/16/2018  Transportation Screening Complete Complete Complete  Medication Review Press photographer) Complete Complete Complete  PCP or Specialist appointment within 3-5 days of discharge (No Data) - Complete  PCP/Specialist Appt Not Complete comments - - -  HRI or [place] (No Data) - Complete  SW Recovery Care/Counseling Consult Complete - Not Complete  SW Consult Not Complete Comments - - na  Palliative Care Screening - - Not Applicable  Medication Reconcilation ([place] Complete Complete Complete  Some recent data might be hidden

## 2019-02-14 NOTE — Progress Notes (Signed)
Ch responded to RR regarding pt. Pt is a 50 YOF from the [facility] facility. Ch provided compassionate presence as the care team stabilized pt. Pt has con pre for ESBL. Pt does have a DNR on file from November admission but is currently listed as FULL code. Ch will f/u with the care team to determine pt progress at a later time.    02/14/19 0900  Clinical Encounter Type  Visited With Patient;Health care provider  Visit Type Code  Referral From Nurse  Consult/Referral To Chaplain  Stress Factors  Patient Stress Factors Health changes;Major life changes  Family Stress Factors None identified

## 2019-02-15 DIAGNOSIS — E118 Type 2 diabetes mellitus with unspecified complications: Secondary | ICD-10-CM | POA: Diagnosis not present

## 2019-02-15 DIAGNOSIS — J9620 Acute and chronic respiratory failure, unspecified whether with hypoxia or hypercapnia: Secondary | ICD-10-CM | POA: Diagnosis not present

## 2019-02-15 DIAGNOSIS — D509 Iron deficiency anemia, unspecified: Secondary | ICD-10-CM | POA: Diagnosis not present

## 2019-02-15 DIAGNOSIS — Z8673 Personal history of transient ischemic attack (TIA), and cerebral infarction without residual deficits: Secondary | ICD-10-CM | POA: Diagnosis not present

## 2019-02-15 DIAGNOSIS — R0902 Hypoxemia: Secondary | ICD-10-CM | POA: Diagnosis not present

## 2019-02-15 DIAGNOSIS — Z7401 Bed confinement status: Secondary | ICD-10-CM | POA: Diagnosis not present

## 2019-02-15 DIAGNOSIS — E1165 Type 2 diabetes mellitus with hyperglycemia: Secondary | ICD-10-CM | POA: Diagnosis not present

## 2019-02-15 DIAGNOSIS — I714 Abdominal aortic aneurysm, without rupture: Secondary | ICD-10-CM | POA: Diagnosis not present

## 2019-02-15 DIAGNOSIS — G40909 Epilepsy, unspecified, not intractable, without status epilepticus: Secondary | ICD-10-CM | POA: Diagnosis not present

## 2019-02-15 DIAGNOSIS — E1169 Type 2 diabetes mellitus with other specified complication: Secondary | ICD-10-CM | POA: Diagnosis not present

## 2019-02-15 DIAGNOSIS — G4733 Obstructive sleep apnea (adult) (pediatric): Secondary | ICD-10-CM | POA: Diagnosis not present

## 2019-02-15 DIAGNOSIS — R404 Transient alteration of awareness: Secondary | ICD-10-CM | POA: Diagnosis not present

## 2019-02-15 DIAGNOSIS — I5032 Chronic diastolic (congestive) heart failure: Secondary | ICD-10-CM | POA: Diagnosis not present

## 2019-02-15 DIAGNOSIS — E119 Type 2 diabetes mellitus without complications: Secondary | ICD-10-CM | POA: Diagnosis not present

## 2019-02-15 DIAGNOSIS — R5381 Other malaise: Secondary | ICD-10-CM | POA: Diagnosis not present

## 2019-02-15 DIAGNOSIS — R4182 Altered mental status, unspecified: Secondary | ICD-10-CM | POA: Diagnosis not present

## 2019-02-15 DIAGNOSIS — I10 Essential (primary) hypertension: Secondary | ICD-10-CM | POA: Diagnosis not present

## 2019-02-15 DIAGNOSIS — R569 Unspecified convulsions: Secondary | ICD-10-CM | POA: Diagnosis not present

## 2019-02-15 DIAGNOSIS — E785 Hyperlipidemia, unspecified: Secondary | ICD-10-CM | POA: Diagnosis not present

## 2019-02-15 DIAGNOSIS — I130 Hypertensive heart and chronic kidney disease with heart failure and stage 1 through stage 4 chronic kidney disease, or unspecified chronic kidney disease: Secondary | ICD-10-CM | POA: Diagnosis not present

## 2019-02-15 DIAGNOSIS — N183 Chronic kidney disease, stage 3 unspecified: Secondary | ICD-10-CM | POA: Diagnosis not present

## 2019-02-15 DIAGNOSIS — Z79899 Other long term (current) drug therapy: Secondary | ICD-10-CM | POA: Diagnosis not present

## 2019-02-15 DIAGNOSIS — Z7901 Long term (current) use of anticoagulants: Secondary | ICD-10-CM | POA: Diagnosis not present

## 2019-02-15 DIAGNOSIS — U071 COVID-19: Secondary | ICD-10-CM | POA: Diagnosis not present

## 2019-02-15 DIAGNOSIS — J9611 Chronic respiratory failure with hypoxia: Secondary | ICD-10-CM | POA: Diagnosis not present

## 2019-02-15 DIAGNOSIS — M2550 Pain in unspecified joint: Secondary | ICD-10-CM | POA: Diagnosis not present

## 2019-02-15 DIAGNOSIS — E1159 Type 2 diabetes mellitus with other circulatory complications: Secondary | ICD-10-CM | POA: Diagnosis not present

## 2019-02-15 DIAGNOSIS — Z87891 Personal history of nicotine dependence: Secondary | ICD-10-CM | POA: Diagnosis not present

## 2019-02-15 DIAGNOSIS — J9811 Atelectasis: Secondary | ICD-10-CM | POA: Diagnosis not present

## 2019-02-15 DIAGNOSIS — J449 Chronic obstructive pulmonary disease, unspecified: Secondary | ICD-10-CM | POA: Diagnosis not present

## 2019-02-15 DIAGNOSIS — I509 Heart failure, unspecified: Secondary | ICD-10-CM | POA: Diagnosis not present

## 2019-02-15 LAB — COMPREHENSIVE METABOLIC PANEL
ALT: 45 U/L — ABNORMAL HIGH (ref 0–44)
AST: 76 U/L — ABNORMAL HIGH (ref 15–41)
Albumin: 2 g/dL — ABNORMAL LOW (ref 3.5–5.0)
Alkaline Phosphatase: 435 U/L — ABNORMAL HIGH (ref 38–126)
Anion gap: 5 (ref 5–15)
BUN: 9 mg/dL (ref 8–23)
CO2: 24 mmol/L (ref 22–32)
Calcium: 9.2 mg/dL (ref 8.9–10.3)
Chloride: 108 mmol/L (ref 98–111)
Creatinine, Ser: 1.04 mg/dL — ABNORMAL HIGH (ref 0.44–1.00)
GFR calc Af Amer: >60 mL/min (ref >60)
GFR calc non Af Amer: 52 mL/min — ABNORMAL LOW (ref >60)
Glucose, Bld: 81 mg/dL (ref 70–99)
Potassium: 3.5 mmol/L (ref 3.5–5.1)
Sodium: 137 mmol/L (ref 135–145)
Total Bilirubin: 0.7 mg/dL (ref 0.3–1.2)
Total Protein: 6.4 g/dL — ABNORMAL LOW (ref 6.5–8.1)

## 2019-02-15 LAB — CBC
HCT: 28.4 % — ABNORMAL LOW (ref 36.0–46.0)
Hemoglobin: 9.7 g/dL — ABNORMAL LOW (ref 12.0–15.0)
MCH: 26.9 pg (ref 26.0–34.0)
MCHC: 34.2 g/dL (ref 30.0–36.0)
MCV: 78.9 fL — ABNORMAL LOW (ref 80.0–100.0)
Platelets: 308 10*3/uL (ref 150–400)
RBC: 3.6 MIL/uL — ABNORMAL LOW (ref 3.87–5.11)
RDW: 17.2 % — ABNORMAL HIGH (ref 11.5–15.5)
WBC: 4.4 10*3/uL (ref 4.0–10.5)
nRBC: 0 % (ref 0.0–0.2)

## 2019-02-15 LAB — GLUCOSE, POCT (MANUAL RESULT ENTRY): POC Glucose: 108 mg/dl — AB (ref 70–99)

## 2019-02-15 LAB — MAGNESIUM: Magnesium: 2.1 mg/dL (ref 1.7–2.4)

## 2019-02-15 LAB — GLUCOSE, CAPILLARY
Glucose-Capillary: 75 mg/dL (ref 70–99)
Glucose-Capillary: 108 mg/dL — ABNORMAL HIGH (ref 70–99)
Glucose-Capillary: 85 mg/dL (ref 70–99)

## 2019-02-15 MED ORDER — FERROUS SULFATE 325 (65 FE) MG PO TABS: 325.0000 mg | ORAL_TABLET | Freq: Two times a day (BID) | ORAL | 0 refills | Status: DC

## 2019-02-15 MED ORDER — ONDANSETRON 4 MG PO TBDP
4.0000 mg | ORAL_TABLET | Freq: Three times a day (TID) | ORAL | Status: DC | PRN
  Filled 2019-02-15: qty 1

## 2019-02-15 MED ORDER — ONDANSETRON HCL 4 MG/2ML IJ SOLN
4.0000 mg | Freq: Three times a day (TID) | INTRAMUSCULAR | Status: DC | PRN
  Filled 2019-02-15: qty 2

## 2019-02-15 MED ORDER — LEVETIRACETAM ER 750 MG PO TB24: 1500.0000 mg | ORAL_TABLET | Freq: Every day | ORAL | 0 refills | Status: DC

## 2019-02-15 MED ORDER — ONDANSETRON HCL 4 MG/2ML IJ SOLN: 4.0000 mg | Freq: Four times a day (QID) | INTRAMUSCULAR | Status: DC | PRN

## 2019-02-15 NOTE — Progress Notes (Signed)
MD order received in [facility] to discharge pt to SNF today; TOC previously prepared discharge packet for EMS personnel to take to [place]; telephone call to [facility] [phone] transferred to RN to take report, number just rang and rang; disconnected call

## 2019-02-15 NOTE — Plan of Care (Signed)
Report called to [facility] facility, report given to [employer] [employer]. Patient aware of transfer.

## 2019-02-15 NOTE — Progress Notes (Signed)
Active orders for stroke discontinued per Lenise Herald MD verbal order to RN during assessment and am medication pass

## 2019-02-15 NOTE — NC FL2 (Signed)
[place] LEVEL OF CARE SCREENING TOOL     IDENTIFICATION  Patient Name: Katherine [name] Birthdate: 1940-07-04 Sex: female Admission Date (Current Location): 02/11/2019  [place] and [place] Number:  Engineering geologist and Address:  [facility], [address], [place], [place] [address]      Provider Number: [id number]  Attending Physician Name and Address:  Wyvonnia Dusky, MD  Relative Name and Phone Number:  Bennie Hind [phone]    Current Level of Care: Hospital Recommended Level of Care: [place] Prior Approval Number:    Date Approved/Denied:   PASRR Number: [id number]  Discharge Plan: SNF    Current Diagnoses: Patient Active Problem List   Diagnosis Date Noted  . Iron deficiency anemia   . Altered mental status   . Acute encephalopathy 02/11/2019  . Sepsis secondary to UTI ([place]) 01/08/2019  . Acute renal failure superimposed on stage 3b chronic kidney disease ([place]) 01/08/2019  . Seizure disorder ([place]) 01/08/2019  . Diabetes mellitus type 2, controlled, with complications ([place]) [id number]  . Acute on chronic anemia 01/03/2019  . COVID-19 12/21/2018  . COVID-19 virus infection 12/21/2018  . Hemoptysis 12/20/2018  . Pulmonary embolism ([place]) 11/21/2018  . Delirium   . UTI (urinary tract infection) 08/21/2018  . Chest pain 07/15/2018  . Palliative care encounter 05/10/2018  . Localized edema 05/10/2018  . Shortness of breath 05/10/2018  . Hematemesis 04/28/2018  . Influenza A 04/13/2018  . Acute on chronic congestive heart failure ([place])   . COPD with acute exacerbation ([place]) 02/24/2018  . Chronic diastolic heart failure ([place]) 12/31/2017  . Lymphedema 12/31/2017  . COPD exacerbation ([place]) 11/30/2017  . Urinary tract infection 11/22/2017  . 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[place]) 11/16/2017  . Diabetes mellitus type 2, uncomplicated ([place]) 0000000  . Pressure injury of skin  10/29/2017  . Acute kidney injury superimposed on CKD ([place]) 10/24/2017  . Sepsis ([place]) 10/24/2017  . Possible Seizures ([place]) 10/08/2017  . Hyperlipemia 10/08/2017  . 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 Stroke-like episode ([place]) s/p IV tpa 10/04/2017  . Palliative care by specialist   . Elevated rheumatoid factor 09/05/2017  . Frequent hospital admissions 08/22/2017  . Aphasia 06/28/2017  . Right hand pain 03/21/2017  . Dry skin 03/21/2017  . Pain in finger of left hand 09/12/2016  . Chronic fatigue 06/12/2016  . Left knee pain 06/12/2016  . Goals of care, counseling/discussion 03/13/2016  . Primary localized osteoarthritis of right knee 02/08/2016  . OSA (obstructive sleep apnea) 09/16/2015  . Rotator cuff syndrome 09/07/2015  . 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 Sleep disturbance 04/14/2015  . Coronary artery disease 03/14/2015  . 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 Acute respiratory failure with hypoxia ([place]) 09/16/2014  . 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 Anxiety   . Tobacco abuse   . Prurigo nodularis   . GERD (gastroesophageal reflux disease)   . COPD (chronic obstructive pulmonary disease) ([place])   . Osteoarthritis   . Vitamin D deficiency disease   . Chronic constipation   . CKD (chronic kidney disease), stage III   . Essential hypertension 05/20/2013  . Morbid obesity ([place]) 05/20/2013    Orientation RESPIRATION BLADDER Height & Weight     Self, Time, Situation  Normal Incontinent Weight: 221 lb 12.5 oz (100.6 kg) Height:  5\' 5"  (165.1 cm)  BEHAVIORAL SYMPTOMS/MOOD NEUROLOGICAL BOWEL NUTRITION STATUS      Incontinent Diet(Carb Modified Diet)  AMBULATORY STATUS  COMMUNICATION OF NEEDS Skin   Extensive Assist Verbally Normal                       Personal Care Assistance Level of Assistance  Bathing, Feeding, Dressing Bathing Assistance: Maximum assistance Feeding assistance: Maximum assistance Dressing Assistance: Maximum assistance      Functional Limitations Info  Sight, Hearing, Speech Sight Info: Adequate Hearing Info: Adequate Speech Info: Adequate    SPECIAL CARE FACTORS FREQUENCY  PT (By licensed PT), OT (By licensed OT)     PT Frequency: minimum 5x OT Frequency: minimum 3x            Contractures      Additional Factors Info  Code Status, Allergies, Psychotropic, Insulin Sliding Scale Code Status Info: Full Code Allergies Info: Bee Venom, Enalapril Maleate Psychotropic Info: FLUoxetine (PROZAC) capsule 40 mg Insulin Sliding Scale Info: insulin aspart (novoLOG) injection 0-15 Units 3x per day w/meals       Current Medications (02/15/2019):  This is the current hospital active medication list Current Facility-Administered Medications  Medication Dose Route Frequency Provider Last Rate Last Admin  . acetaminophen (TYLENOL) tablet 650 mg  650 mg Oral Q4H PRN Ralene Muskrat B, MD   650 mg at 02/15/19 1104   Or  . acetaminophen (TYLENOL) 160 MG/5ML solution 650 mg  650 mg Per Tube Q4H PRN Ralene Muskrat B, MD       Or  . acetaminophen (TYLENOL) suppository 650 mg  650 mg Rectal Q4H PRN Sreenath, Sudheer B, MD      . apixaban (ELIQUIS) tablet 5 mg  5 mg Oral BID Ralene Muskrat B, MD   5 mg at 02/15/19 1056  . ascorbic acid (VITAMIN C) tablet 500 mg  500 mg Oral Daily Sreenath, Sudheer B, MD   500 mg at 02/15/19 1056  . atorvastatin (LIPITOR) tablet 80 mg  80 mg Oral QPM Ralene Muskrat B, MD   80 mg at 02/14/19 1740  . cholecalciferol (VITAMIN D3) tablet 1,000 Units  1,000 Units Oral Daily Ralene Muskrat B, MD   1,000 Units at 02/15/19 1056  . dextrose 50 % solution 25 mL  25 mL Intravenous Once Florina Ou V, MD      . ferrous sulfate tablet 325 mg  325 mg Oral BID WC Thornell Mule, MD   325 mg at 02/15/19 1056  . FLUoxetine (PROZAC) capsule 40 mg  40 mg Oral Daily Ralene Muskrat B, MD   40 mg at 02/15/19 1055  . insulin aspart (novoLOG) injection 0-15 Units  0-15 Units Subcutaneous  TID WC Sreenath, Sudheer B, MD      . insulin aspart (novoLOG) injection 0-5 Units  0-5 Units Subcutaneous QHS Sreenath, Sudheer B, MD      . ipratropium-albuterol (DUONEB) 0.5-2.5 (3) MG/3ML nebulizer solution 3 mL  3 mL Nebulization Q4H PRN Ralene Muskrat B, MD   3 mL at 02/14/19 0951  . lacosamide (VIMPAT) tablet 100 mg  100 mg Oral BID Ralene Muskrat B, MD   100 mg at 02/15/19 1055  . levETIRAcetam (KEPPRA XR) 24 hr tablet 1,500 mg  1,500 mg Oral Daily Alexis Goodell, MD   1,500 mg at 02/15/19 1055  . loratadine (CLARITIN) tablet 10 mg  10 mg Oral QHS Ralene Muskrat B, MD   10 mg at 02/14/19 2007  . multivitamin with minerals tablet 1 tablet  1 tablet Oral Daily Ralene Muskrat B, MD   1 tablet at 02/15/19 1055  .  nitroGLYCERIN (NITROSTAT) SL tablet 0.4 mg  0.4 mg Sublingual Q5 min PRN Priscella Mann, Sudheer B, MD      . ondansetron (ZOFRAN) injection 4 mg  4 mg Intravenous Q8H PRN Wyvonnia Dusky, MD      . ondansetron (ZOFRAN-ODT) disintegrating tablet 4 mg  4 mg Oral Q8H PRN Wyvonnia Dusky, MD      . pantoprazole (PROTONIX) EC tablet 40 mg  40 mg Oral Daily Ralene Muskrat B, MD   40 mg at 02/15/19 1055  . polyethylene glycol (MIRALAX / GLYCOLAX) packet 17 g  17 g Oral Daily Thornell Mule, MD   17 g at 02/14/19 1057  . psyllium (HYDROCIL/METAMUCIL) packet 1 packet  1 packet Oral Daily Thornell Mule, MD   1 packet at 02/14/19 1111  . senna-docusate (Senokot-S) tablet 1 tablet  1 tablet Oral QHS PRN Ralene Muskrat B, MD      . zinc sulfate capsule 220 mg  220 mg Oral Daily Priscella Mann, Sudheer B, MD   220 mg at 02/15/19 1056     Discharge Medications: Please see discharge summary for a list of discharge medications.  Relevant Imaging Results:  Relevant Lab Results:   Additional Information SS# [id number]  Berenice Bouton, LCSW

## 2019-02-15 NOTE — Progress Notes (Signed)
Pt with h/o 432ml of yellow emesis; order obtained from Dr Lenise Herald for PRN nausea medicine; took PRN Zofran in the room and the pt verbalized that she did not need anything for nausea at this time; Zofran returned to the [facility]

## 2019-02-15 NOTE — Discharge Summary (Signed)
Physician Discharge Summary  Sahirah Chaumont [id number] DOB: 03-03-1940 DOA: 02/11/2019  PCP: Valerie Roys, DO  Admit date: 02/11/2019 Discharge date: 02/15/2019  Admitted From: SNF: [facility] Disposition:   SNF: [facility]  Recommendations for Outpatient Follow-up:  1. Follow up with PCP in 1-2 weeks 2. F/u neuro in 1 week  Home Health: no, going back to SNF  Equipment/Devices: n/a  Discharge Condition: stable  CODE STATUS: full  Diet recommendation: carb modified   Brief/Interim Summary: HPI was taken from Dr. Priscella Mann: [name] is a 79 y.o. female with medical history significant of chronic kidney disease stage III, COPD, type 2 diabetes mellitus, GERD, hypertension, obesity, seizures, pulmonary embolism on anticoagulation, stroke who was brought to the emergency department from her care home after her aide noted her to be unresponsive in bed this morning.  Patient is altered on my evaluation is unable to provide history.  History is obtained by reviewing medical record, speaking with the ED team.  Per documentation the patient was apparently normal yesterday though unclear exactly what time.  At baseline she is not ambulatory but is able to communicate her needs with the assisted living facility staff.  The patient had a positive coronavirus in early November.    On my evaluation patient is resting in bed.  She is in no visible distress.  She is not verbally responsive but does make grunting noises and does follow some commands.  She is able to lift her left arm but is unable to move her right arm.  She is unable to move her lower extremities.  She is unable to follow my finger on extraocular muscles examination.  Vital signs within normal limits.  Patient is afebrile normotensive with adequate heart rate and blood pressure.  She is not requiring any supplemental oxygen.  ED Course: Initial laboratory investigations in the emergency department overall  unrevealing.  Hematologic and metabolic panels are overall within normal limits.  Patient does have some evidence of white blood cells on her urinalysis but no bacteria.  Imaging survey also unrevealing.  Chest x-ray did not reveal any acute infiltrates.  CT head demonstrates no infarction but evidence of small vessel ischemic disease.  Internal medicine called for admission and further work-up patient's encephalopathy.  Hospital Course: Pt presented w/ acute encephalopathy of unknown etiology. CT brain x 2 was neg. Neuro saw the pt and recommend to continue on increased dose of keppra and continue home dose of vimpat. Keppra level was less than 1 on admission. Pt's mental status was likely back to baseline prior to d/c after speaking with the pt's husband via telephone. Furthermore, pt was started on iron supplements for IDA/ACD. Fecal occult was ordered but unable to collect stool. H&H remained stable and did not drop below 7.8. Hb& Hct was 9.7 & 28.4. on day of d/. No hematemesis, hemoptysis or melena noted while inpatient. Pt will need f/u w/ PCP and possibly GI for further evaluation of IDA/ACD.   Discharge Diagnoses:  Active Problems:   Right hand pain   Acute encephalopathy   Altered mental status  Acute encephalopathy: likely metabolic but exact etiology unclear. CT brain reveal some small vessel ischemic disease however overall unrevealing. Likely mental status back to baseline after talking w/ pt's husband via telephone. COVID19 on 02/11/19 neg. Blood cxs NGTD. Urine cx shows no growth   Seizure disorder: unknown type. Continue on vimpat and keppra. 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 S/p 1  unit of PRBCs while inpatient. H&H are stable. Unable to obtain stool for fecal occult. Etiology unclear, possibly from eliquis use vs from GI tract. Will f/u PCP for further evaluation  Hx of pulmonary embolism: continue on eliquis. Dx in Oct 2020  COPD: w/o exacerbation. 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 Urine cx shows no growth  Depression: severity unknown. Continue on home dose of prozac  GERD: will continue on PPI   Transaminitis: mildly elevated but trending down. Etiology unclear. Will continue to monitor   Elevated alkaline phosphatase: etiology unclear but trending down. Will continue to monitor   Discharge Instructions  Discharge Instructions    Diet Carb Modified   Complete by: As directed    Discharge instructions   Complete by: As directed    F/u PCP in 1-2 weeks; F/u w/ neurology in 1 week   Increase activity slowly   Complete by: As directed      Allergies as of 02/15/2019      Reactions   Bee Venom Swelling   Enalapril Maleate Swelling      Medication List    TAKE these medications   acetaminophen 650 MG CR tablet Commonly known as: TYLENOL Take 650 mg by mouth 3 (three) times daily.   albuterol 0.63 MG/3ML nebulizer solution Commonly known as: ACCUNEB Take 3 mLs by nebulization every 8 (eight) hours as needed for wheezing.   apixaban 5 MG Tabs tablet Commonly known as: ELIQUIS Take 1 tablet (5 mg total) by mouth 2 (two) times daily. To initiate this on 11/29/2018 after completing the 10 mg twice daily treatment regimen   ascorbic acid 500 MG tablet Commonly known as: VITAMIN C Take 1 tablet (500 mg total) by mouth daily.   atorvastatin 80 MG tablet Commonly known as: LIPITOR Take 80 mg by mouth every evening.   cetirizine 10 MG tablet Commonly known as: ZYRTEC Take 5 mg by mouth at bedtime.   cholecalciferol 1000 units tablet Commonly known as: VITAMIN D Take 1,000 Units by mouth daily.   diclofenac sodium 1 % Gel Commonly known as: VOLTAREN Apply 2 g topically every 12 (twelve) hours as needed (for left knee pain).   docusate sodium 100 MG capsule Commonly known as: COLACE Take 100 mg by mouth 2 (two) times daily.   ferrous sulfate 325 (65  FE) MG tablet Take 1 tablet (325 mg total) by mouth 2 (two) times daily with a meal.   FLUoxetine 40 MG capsule Commonly known as: PROZAC Take 40 mg by mouth daily.   Fluticasone-Umeclidin-Vilant 100-62.5-25 MCG/INH Aepb Inhale 1 puff into the lungs daily.   gabapentin 300 MG capsule Commonly known as: NEURONTIN Take 300 mg by mouth at bedtime.   ipratropium 0.03 % nasal spray Commonly known as: ATROVENT Place 2 sprays into both nostrils 2 (two) times daily.   ipratropium-albuterol 0.5-2.5 (3) MG/3ML Soln Commonly known as: DUONEB Take 3 mLs by nebulization 3 (three) times daily.   Lacosamide 100 MG Tabs Take 1 tablet (100 mg total) by mouth 2 (two) times daily.   Levetiracetam 750 MG Tb24 Take 2 tablets (1,500 mg total) by mouth daily. Start taking on: February 16, 2019 What changed:   medication strength  how much to take   linagliptin 5 MG Tabs tablet Commonly known as: TRADJENTA Take 5 mg by mouth daily.   meclizine 25 MG tablet Commonly known as: ANTIVERT Take 25 mg by mouth  every 12 (twelve) hours as needed for dizziness.   multivitamin with minerals Tabs tablet Take 1 tablet by mouth daily.   mupirocin ointment 2 % Commonly known as: BACTROBAN Place 1 application into the nose 2 (two) times daily.   nitroGLYCERIN 0.4 MG SL tablet Commonly known as: NITROSTAT Place 0.4 mg under the tongue every 5 (five) minutes as needed for chest pain.   pantoprazole 40 MG tablet Commonly known as: PROTONIX Take 1 tablet (40 mg total) by mouth daily.   zinc sulfate 220 (50 Zn) MG capsule Take 1 capsule (220 mg total) by mouth daily.       Allergies  Allergen Reactions  . Bee Venom Swelling  . Enalapril Maleate Swelling    Consultations: neuro  Procedures/Studies: CT HEAD WO CONTRAST  Result Date: 02/14/2019 CLINICAL DATA:  Altered mental status. EXAM: CT HEAD WITHOUT CONTRAST TECHNIQUE: Contiguous axial images were obtained from the base of the skull  through the vertex without intravenous contrast. COMPARISON:  February 11, 2019. FINDINGS: Brain: Mild diffuse cortical atrophy is noted. Mild chronic ischemic white matter disease is noted. No mass effect or midline shift is noted. Ventricular size is within normal limits. There is no evidence of mass lesion, hemorrhage or acute infarction. Vascular: No hyperdense vessel or unexpected calcification. Skull: Normal. Negative for fracture or focal lesion. Sinuses/Orbits: No acute finding. Other: Fluid is noted in right mastoid air cells. IMPRESSION: Mild diffuse cortical atrophy. Mild chronic ischemic white matter disease. No acute intracranial abnormality seen. Electronically Signed   By: Marijo Conception M.D.   On: 02/14/2019 11:36   CT Head Wo Contrast  Result Date: 02/11/2019 CLINICAL DATA:  Altered mental status/unresponsive. EXAM: CT HEAD WITHOUT CONTRAST TECHNIQUE: Contiguous axial images were obtained from the base of the skull through the vertex without intravenous contrast. COMPARISON:  December 24, 2018 FINDINGS: Brain: Mild diffuse atrophy is stable. There is no intracranial mass, hemorrhage, extra-axial fluid collection, or midline shift. There is mild small vessel disease in the centra semiovale bilaterally. No acute appearing infarct is evident on this study. Vascular: No hyperdense vessel. There is calcification in each carotid siphon region. There is slight calcification in the distal left vertebral artery. Skull: The bony calvarium appears intact. Sinuses/Orbits: There is mucosal thickening in several ethmoid air cells. Other visualized paranasal sinuses are clear. Orbits appear symmetric bilaterally. Other: There is diffuse opacification of mastoid air cells on the right. Mastoids on the left are clear. IMPRESSION: Mild atrophy with mild periventricular small vessel disease. No acute infarct. No mass or hemorrhage. There are foci of arterial vascular calcification. There is mucosal thickening in  several ethmoid air cells. There is diffuse opacification of mastoid air cells on the right, a finding present on previous study as well. Electronically Signed   By: Lowella Grip III M.D.   On: 02/11/2019 10:08   MR BRAIN WO CONTRAST  Result Date: 02/11/2019 CLINICAL DATA:  Ataxia.  Altered mental status. EXAM: MRI HEAD WITHOUT CONTRAST TECHNIQUE: Multiplanar, multiecho pulse sequences of the brain and surrounding structures were obtained without intravenous contrast. COMPARISON:  Head CT same day. FINDINGS: Brain: Diffusion imaging suffers from some motion degradation but does not show any acute or subacute infarction. The brainstem shows chronic small-vessel change affecting the pons. There are moderate chronic appearing small vessel ischemic changes of the hemispheric white matter. Few old small vessel infarctions in the right basal ganglia. No large vessel territory infarction. No mass lesion, hemorrhage, hydrocephalus or extra-axial collection. Vascular:  Major vessels at the base of the brain show flow. Skull and upper cervical spine: Negative Sinuses/Orbits: Paranasal sinuses are clear. Extensive mastoid effusion on the right could possibly be symptomatic. Other: None IMPRESSION: No acute intracranial finding. Chronic small-vessel ischemic changes affecting the pons, right basal ganglia and cerebral hemispheric white matter. Extensive mastoid effusion on the right which could possibly be symptomatic. Electronically Signed   By: Nelson Chimes M.D.   On: 02/11/2019 13:34   DG Chest Port 1 View  Result Date: 02/14/2019 CLINICAL DATA:  Shortness of breath. EXAM: PORTABLE CHEST 1 VIEW COMPARISON:  February 11, 2019. FINDINGS: Stable cardiomediastinal silhouette. Atherosclerosis of thoracic aorta is noted. No pneumothorax is noted. Mild bibasilar subsegmental atelectasis or scarring is noted. Minimal right pleural effusion or pleural thickening is noted. Bony thorax is unremarkable. IMPRESSION: Aortic  atherosclerosis. Stable mild bibasilar subsegmental atelectasis or scarring. Minimal right pleural effusion or pleural thickening is noted. Electronically Signed   By: Marijo Conception M.D.   On: 02/14/2019 10:12   DG Chest Port 1 View  Result Date: 02/11/2019 CLINICAL DATA:  Altered mental status and hypertension. EXAM: PORTABLE CHEST 1 VIEW COMPARISON:  January 05, 2019 FINDINGS: There is atelectatic change in the lung bases. There is no edema or consolidation. Heart is borderline enlarged with pulmonary vascularity normal. Aorta is prominent with aortic atherosclerosis. No adenopathy. Bones are osteoporotic. There is degenerative change in each shoulder. IMPRESSION: Bibasilar atelectasis. No edema or consolidation. Stable cardiac silhouette. Aortic prominence may be indicative of chronic hypertension. Aortic Atherosclerosis (ICD10-I70.0). Bones osteoporotic. Electronically Signed   By: Lowella Grip III M.D.   On: 02/11/2019 10:22   DG Hand Complete Right  Result Date: 02/13/2019 CLINICAL DATA:  Right hand pain, no known injury, initial encounter EXAM: RIGHT HAND - COMPLETE 3+ VIEW COMPARISON:  None. FINDINGS: No acute fracture or dislocation is noted. Mild radiocarpal degenerative change is seen. Mild interphalangeal degenerative changes are noted as well as changes at the first [facility] and MCP joints. No bony erosive changes are seen. No other focal abnormality is noted. IMPRESSION: Mild osteoarthritic changes without acute abnormality. Electronically Signed   By: [name] Catalina M.D.   On: 02/13/2019 10:42   ECHOCARDIOGRAM COMPLETE  Result Date: 02/12/2019   ECHOCARDIOGRAM REPORT   Patient Name:   ROSHEENA OHORA Date of Exam: 02/12/2019 Medical Rec #:  [id number]      Height: Accession #:    [id number]     Weight: Date of Birth:  [birth year]      BSA: Patient Age:    2 years       BP:           128/81 mmHg Patient Gender: F              HR:           95 bpm. Exam Location:  [facility] Procedure: 2D Echo,  Color Doppler, Cardiac Doppler and Saline Contrast Bubble            Study Indications:     Stroke 434.91  History:         Patient has prior history of Echocardiogram examinations, most                  recent 07/16/2018. COPD and Stroke; Risk Factors:Hypertension and                  Diabetes. Tobacco abuse.  Sonographer:     Sherrie Sport RDCS (AE) Referring Phys:  [id number] Sidney Ace Diagnosing Phys: Kate Sable MD  Sonographer Comments: No parasternal window, no apical window and suboptimal subcostal window. Image acquisition challenging due to COPD. IMPRESSIONS  1. Left ventricular ejection fraction, by visual estimation, is 55 to 60%. The left ventricle has normal function. There is no left ventricular hypertrophy.  2. Left ventricular diastolic function could not be evaluated.  3. The left ventricle has no regional wall motion abnormalities.  4. Global right ventricle has normal systolic function.The right ventricular size is normal. Right vetricular wall thickness was not assessed.  5. Left atrial size was normal.  6. Right atrial size was normal.  7. The mitral valve was not well visualized. No evidence of mitral valve regurgitation.  8. The tricuspid valve is normal in structure.  9. Aortic valve regurgitation not assessed. 10. The aortic valve was not well visualized. Aortic valve regurgitation not assessed. 11. Pulmonic regurgitation not assessed. 12. The pulmonic valve was not well visualized. Pulmonic valve regurgitation not assessed. 13. The aortic root was not well visualized. 14. The inferior vena cava is normal in size with greater than 50% respiratory variability, suggesting right atrial pressure of 3 mmHg. FINDINGS  Left Ventricle: Left ventricular ejection fraction, by visual estimation, is 55 to 60%. The left ventricle has normal function. The left ventricle has no regional wall motion abnormalities. There is no left ventricular hypertrophy. Left ventricular diastolic function could  not be evaluated. Right Ventricle: The right ventricular size is normal. Right vetricular wall thickness was not assessed. Global RV systolic function is has normal systolic function. Left Atrium: Left atrial size was normal in size. Right Atrium: Right atrial size was normal in size Pericardium: There is no evidence of pericardial effusion. Mitral Valve: The mitral valve was not well visualized. No evidence of mitral valve regurgitation. Tricuspid Valve: The tricuspid valve is normal in structure. Tricuspid valve regurgitation is trivial. Aortic Valve: The aortic valve was not well visualized. Aortic valve regurgitation not assessed. Pulmonic Valve: The pulmonic valve was not well visualized. Pulmonic valve regurgitation not assessed. Pulmonic regurgitation not assessed. Aorta: The aortic root was not well visualized. Venous: The inferior vena cava is normal in size with greater than 50% respiratory variability, suggesting right atrial pressure of 3 mmHg. IAS/Shunts: No atrial level shunt detected by color flow Doppler. Agitated saline contrast was given intravenously to evaluate for intracardiac shunting. Saline contrast bubble study was negative, with no evidence of any interatrial shunt.  Kate Sable MD Electronically signed by Kate Sable MD Signature Date/Time: 02/12/2019/1:44:18 PM    Final       Subjective: Pt c/o fatigue but is ready to go back to [facility].   Discharge Exam: Vitals:   02/15/19 1111 02/15/19 1634  BP:  (!) 142/85  Pulse:  85  Resp:    Temp:  98 F (36.7 C)  SpO2: 97% 97%   Vitals:   02/15/19 0036 02/15/19 0745 02/15/19 1111 02/15/19 1634  BP: (!) 150/86 (!) 155/90  (!) 142/85  Pulse: 81 76  85  Resp: 20     Temp: (!) 97.4 F (36.3 C) 97.8 F (36.6 C)  98 F (36.7 C)  TempSrc: Oral Oral    SpO2: 100% 99% 97% 97%  Weight:      Height:        General: Pt is alert, awake, not in acute distress Cardiovascular: S1/S2 +, no rubs, no  gallops Respiratory: CTA bilaterally, no rales, no rhonchi Abdominal: Soft, NT, obese,  bowel sounds + Extremities: b/l LE edema, no cyanosis    The results of significant diagnostics from this hospitalization (including imaging, microbiology, ancillary and laboratory) are listed below for reference.     Microbiology: Recent Results (from the past 240 hour(s))  Blood Culture (routine x 2)     Status: None (Preliminary result)   Collection Time: 02/11/19  9:45 AM   Specimen: BLOOD  Result Value Ref Range Status   Specimen Description BLOOD BLOOD RIGHT ARM  Final   Special Requests   Final    BOTTLES DRAWN AEROBIC AND ANAEROBIC Blood Culture adequate volume   Culture   Final    NO GROWTH 4 DAYS Performed at [facility], [address] [place], [place] [address]    Report Status PENDING  Incomplete  Blood Culture (routine x 2)     Status: Abnormal   Collection Time: 02/11/19  9:46 AM   Specimen: BLOOD  Result Value Ref Range Status   Specimen Description BLOOD BLOOD LEFT HAND  Final   Special Requests   Final    BOTTLES DRAWN AEROBIC AND ANAEROBIC Blood Culture adequate volume   Culture  Setup Time   Final    GRAM POSITIVE COCCI ANAEROBIC BOTTLE ONLY CRITICAL RESULT CALLED TO, READ BACK BY AND VERIFIED WITH: WALID NAZARI AT [id number] ON 02/12/19 RH    Culture (A)  Final    STAPHYLOCOCCUS SPECIES (COAGULASE NEGATIVE) THE SIGNIFICANCE OF ISOLATING THIS ORGANISM FROM A SINGLE SET OF BLOOD CULTURES WHEN MULTIPLE SETS ARE DRAWN IS UNCERTAIN. PLEASE NOTIFY THE MICROBIOLOGY DEPARTMENT WITHIN ONE WEEK IF SPECIATION AND SENSITIVITIES ARE REQUIRED.    Report Status 02/13/2019 FINAL  Final  Urine culture     Status: None   Collection Time: 02/11/19 10:40 AM   Specimen: Urine, Catheterized  Result Value Ref Range Status   Specimen Description   Final    URINE, CATHETERIZED Performed at [facility], [address], [place], [place] [address]    Special Requests    Final    NONE Performed at [facility], [address], [place], [place] [address]    Culture   Final    NO GROWTH Performed at [facility], [place] [address], [place], [place] [address]    Report Status 02/12/2019 FINAL  Final  SARS CORONAVIRUS 2 (TAT 6-24 HRS) Nasopharyngeal Nasopharyngeal Swab     Status: None   Collection Time: 02/11/19  1:51 PM   Specimen: Nasopharyngeal Swab  Result Value Ref Range Status   SARS Coronavirus 2 NEGATIVE NEGATIVE Final    Comment: (NOTE) SARS-CoV-2 target nucleic acids are NOT DETECTED. The SARS-CoV-2 RNA is generally detectable in upper and lower respiratory specimens during the acute phase of infection. Negative results do not preclude SARS-CoV-2 infection, do not rule out co-infections with other pathogens, and should not be used as the sole basis for treatment or other patient management decisions. Negative results must be combined with clinical observations, patient history, and epidemiological information. The expected result is Negative. Fact Sheet for Patients: SugarRoll.be Fact Sheet for Healthcare Providers: https://www.woods-mathews.com/ This test is not yet approved or cleared by the [place] FDA and  has been authorized for detection and/or diagnosis of SARS-CoV-2 by FDA under an Emergency Use Authorization (EUA). This EUA will remain  in effect (meaning this test can be used) for the duration of the COVID-19 declaration under Section 56 4(b)(1) of the Act, 21 U.S.C. section 360bbb-3(b)(1), unless the authorization is terminated or  revoked sooner. Performed at [facility], [place] [address] [place], [place] [address]   CULTURE, BLOOD (ROUTINE X 2) w Reflex to ID Panel     Status: None (Preliminary result)   Collection Time: 02/12/19 10:16 AM   Specimen: BLOOD  Result Value Ref Range Status   Specimen Description BLOOD BLOOD RIGHT HAND  Final   Special Requests    Final    BOTTLES DRAWN AEROBIC AND ANAEROBIC Blood Culture adequate volume   Culture   Final    NO GROWTH 3 DAYS Performed at [facility], [address] [place],  [address]    Report Status PENDING  Incomplete  CULTURE, BLOOD (ROUTINE X 2) w Reflex to ID Panel     Status: None (Preliminary result)   Collection Time: 02/12/19 10:16 AM   Specimen: BLOOD  Result Value Ref Range Status   Specimen Description BLOOD BLOOD RIGHT HAND  Final   Special Requests   Final    BOTTLES DRAWN AEROBIC AND ANAEROBIC Blood Culture results may not be optimal due to an excessive volume of blood received in culture bottles   Culture   Final    NO GROWTH 3 DAYS Performed at [facility], [address] [place],  [address]    Report Status PENDING  Incomplete     Labs: BNP (last 3 results) Recent Labs    01/02/19 2303 02/11/19 0945 02/14/19 1109  BNP 108.0* 76.0 [id number]*   Basic Metabolic Panel: Recent Labs  Lab 02/11/19 0945 02/13/19 0330 02/14/19 0640 02/14/19 1109 02/15/19 0529  NA 133* 139 138  --  137  K 4.0 4.0 3.8  --  3.5  CL 103 108 108  --  108  CO2 23 24 23   --  24  GLUCOSE 108* 89 76  --  81  BUN 23 16 11   --  9  CREATININE 1.62* 1.33* 1.11*  --  1.04*  CALCIUM 8.9 9.0 8.9  --  9.2  MG  --   --   --  1.6* 2.1  PHOS  --   --   --  2.9  --    Liver Function Tests: Recent Labs  Lab 02/11/19 0945 02/13/19 0330 02/15/19 0529  AST 103* 78* 76*  ALT 61* 51* 45*  ALKPHOS 539* 453* 435*  BILITOT 0.6 0.7 0.7  PROT 7.3 6.4* 6.4*  ALBUMIN 2.2* 1.9* 2.0*   No results for input(s): LIPASE, AMYLASE in the last 168 hours. Recent Labs  Lab 02/12/19 0627  AMMONIA 18   CBC: Recent Labs  Lab 02/11/19 0945 02/13/19 0330 02/14/19 0640 02/15/19 0529  WBC 6.8 4.9 4.7 4.4  NEUTROABS 4.4  --   --   --   HGB 8.8* 7.8* 8.9* 9.7*  HCT 27.7* 25.2* 28.3* 28.4*  MCV 82.9 85.7 83.0 78.9*  PLT 392 322 305 308   Cardiac Enzymes: Recent Labs   Lab 02/14/19 1109  CKMB 0.7   BNP: Invalid input(s): POCBNP CBG: Recent Labs  Lab 02/14/19 1715 02/14/19 1957 02/15/19 0817 02/15/19 1142 02/15/19 1635  GLUCAP 73 83 75 108* 85   D-Dimer No results for input(s): DDIMER in the last 72 hours. Hgb A1c No results for input(s): HGBA1C in the last 72 hours. Lipid Profile No results for input(s): CHOL, HDL, LDLCALC, TRIG, CHOLHDL, LDLDIRECT in the last 72 hours. Thyroid function studies No results for input(s): TSH, T4TOTAL, T3FREE, THYROIDAB in the last 72 hours.  Invalid input(s): FREET3 Anemia  work up Recent Labs    02/13/19 0330  VITAMINB12 584  FOLATE 9.6  FERRITIN 56  TIBC 248*  IRON 25*   Urinalysis    Component Value Date/Time   COLORURINE YELLOW (A) 02/11/2019 1040   APPEARANCEUR CLEAR (A) 02/11/2019 1040   APPEARANCEUR Clear 02/20/2013 1459   LABSPEC 1.012 02/11/2019 1040   LABSPEC 1.012 02/20/2013 1459   PHURINE 6.0 02/11/2019 1040   GLUCOSEU NEGATIVE 02/11/2019 1040   GLUCOSEU Negative 02/20/2013 1459   HGBUR NEGATIVE 02/11/2019 1040   [place] 02/11/2019 1040   BILIRUBINUR Negative 02/20/2013 1459   KETONESUR NEGATIVE 02/11/2019 1040   PROTEINUR NEGATIVE 02/11/2019 1040   NITRITE NEGATIVE 02/11/2019 1040   LEUKOCYTESUR LARGE (A) 02/11/2019 1040   LEUKOCYTESUR Negative 02/20/2013 1459   Sepsis Labs Invalid input(s): PROCALCITONIN,  WBC,  LACTICIDVEN Microbiology Recent Results (from the past 240 hour(s))  Blood Culture (routine x 2)     Status: None (Preliminary result)   Collection Time: 02/11/19  9:45 AM   Specimen: BLOOD  Result Value Ref Range Status   Specimen Description BLOOD BLOOD RIGHT ARM  Final   Special Requests   Final    BOTTLES DRAWN AEROBIC AND ANAEROBIC Blood Culture adequate volume   Culture   Final    NO GROWTH 4 DAYS Performed at [facility], [address] [place], [place] [address]    Report Status PENDING  Incomplete  Blood Culture (routine x  2)     Status: Abnormal   Collection Time: 02/11/19  9:46 AM   Specimen: BLOOD  Result Value Ref Range Status   Specimen Description BLOOD BLOOD LEFT HAND  Final   Special Requests   Final    BOTTLES DRAWN AEROBIC AND ANAEROBIC Blood Culture adequate volume   Culture  Setup Time   Final    GRAM POSITIVE COCCI ANAEROBIC BOTTLE ONLY CRITICAL RESULT CALLED TO, READ BACK BY AND VERIFIED WITH: WALID NAZARI AT [id number] ON 02/12/19 RH    Culture (A)  Final    STAPHYLOCOCCUS SPECIES (COAGULASE NEGATIVE) THE SIGNIFICANCE OF ISOLATING THIS ORGANISM FROM A SINGLE SET OF BLOOD CULTURES WHEN MULTIPLE SETS ARE DRAWN IS UNCERTAIN. PLEASE NOTIFY THE MICROBIOLOGY DEPARTMENT WITHIN ONE WEEK IF SPECIATION AND SENSITIVITIES ARE REQUIRED.    Report Status 02/13/2019 FINAL  Final  Urine culture     Status: None   Collection Time: 02/11/19 10:40 AM   Specimen: Urine, Catheterized  Result Value Ref Range Status   Specimen Description   Final    URINE, CATHETERIZED Performed at [facility], [address] [place], [place] [address]    Special Requests   Final    NONE Performed at [facility], [address], [place], [place] [address]    Culture   Final    NO GROWTH Performed at [facility], [place] [address] [place], [place] [address]    Report Status 02/12/2019 FINAL  Final  SARS CORONAVIRUS 2 (TAT 6-24 HRS) Nasopharyngeal Nasopharyngeal Swab     Status: None   Collection Time: 02/11/19  1:51 PM   Specimen: Nasopharyngeal Swab  Result Value Ref Range Status   SARS Coronavirus 2 NEGATIVE NEGATIVE Final    Comment: (NOTE) SARS-CoV-2 target nucleic acids are NOT DETECTED. The SARS-CoV-2 RNA is generally detectable in upper and lower respiratory specimens during the acute phase of infection. Negative results do not preclude SARS-CoV-2 infection, do not rule out co-infections with other pathogens, and should not be used as  the sole basis for treatment or other patient  management decisions. Negative results must be combined with clinical observations, patient history, and epidemiological information. The expected result is Negative. Fact Sheet for Patients: SugarRoll.be Fact Sheet for Healthcare Providers: https://www.woods-mathews.com/ This test is not yet approved or cleared by the [place] FDA and  has been authorized for detection and/or diagnosis of SARS-CoV-2 by FDA under an Emergency Use Authorization (EUA). This EUA will remain  in effect (meaning this test can be used) for the duration of the COVID-19 declaration under Section 56 4(b)(1) of the Act, 21 U.S.C. section 360bbb-3(b)(1), unless the authorization is terminated or revoked sooner. Performed at [facility], [place] [address], [place], [place] [address]   CULTURE, BLOOD (ROUTINE X 2) w Reflex to ID Panel     Status: None (Preliminary result)   Collection Time: 02/12/19 10:16 AM   Specimen: BLOOD  Result Value Ref Range Status   Specimen Description BLOOD BLOOD RIGHT HAND  Final   Special Requests   Final    BOTTLES DRAWN AEROBIC AND ANAEROBIC Blood Culture adequate volume   Culture   Final    NO GROWTH 3 DAYS Performed at [facility], [address] [place], [place] [address]    Report Status PENDING  Incomplete  CULTURE, BLOOD (ROUTINE X 2) w Reflex to ID Panel     Status: None (Preliminary result)   Collection Time: 02/12/19 10:16 AM   Specimen: BLOOD  Result Value Ref Range Status   Specimen Description BLOOD BLOOD RIGHT HAND  Final   Special Requests   Final    BOTTLES DRAWN AEROBIC AND ANAEROBIC Blood Culture results may not be optimal due to an excessive volume of blood received in culture bottles   Culture   Final    NO GROWTH 3 DAYS Performed at [facility], [address], [place], [place] [address]    Report Status PENDING  Incomplete     Time coordinating discharge: Over 30  minutes  SIGNED:   Wyvonnia Dusky, MD  [facility] 02/15/2019, 5:17 PM Pager   If 7PM-7AM, please contact night-coverage [email or web address] Password [email or web address]

## 2019-02-15 NOTE — TOC Transition Note (Signed)
Transition of Care [facility]) - CM/SW Discharge Note   Patient Details  Name: Katherine [name] MRN: [id number] Date of Birth: 1940/10/27  [facility] CM/SW Contact:  Berenice Bouton, LCSW Phone Number: 02/15/2019, 6:20 PM   Clinical Narrative:   Patient discharging to skills nursing facility, [facility].  Transferred to facility via [facility].     Final next level of care: Skilled Nursing Facility Barriers to Discharge: Barriers Resolved  Patient Goals and CMS Choice Patient states their goals for this hospitalization and ongoing recovery are:: "to get better" CMS Medicare.gov Compare Post Acute Care list provided to:: Patient Choice offered to / list presented to : Patient  Discharge Placement   Existing PASRR number confirmed : 02/15/19          Patient chooses bed at: [facility] Patient to be transferred to facility by: [facility] Name of family member notified: Claiborne Billings [phone] Patient and family notified of of transfer: 02/15/19  Discharge Plan and Services     Post Acute Care Choice: [place]             Social Determinants of Health (SDOH) Interventions     Readmission Risk Interventions Readmission Risk Prevention Plan 11/26/2018 11/22/2018 07/16/2018  Transportation Screening Complete Complete Complete  Medication Review Press photographer) Complete Complete Complete  PCP or Specialist appointment within 3-5 days of discharge (No Data) - Complete  PCP/Specialist Appt Not Complete comments - - -  HRI or Home Care Consult (No Data) - Complete  SW Recovery Care/Counseling Consult Complete - Not Complete  SW Consult Not Complete Comments - - na  Palliative Care Screening - - Not Applicable  Medication Reconcilation ([place] Complete Complete Complete  Some recent data might be hidden

## 2019-02-15 NOTE — Progress Notes (Signed)
Patient transported to [facility], Packet given to EMS. Patient in compliance and agrees with transfer. IV removed, VS stable.

## 2019-02-15 NOTE — Progress Notes (Signed)
Nurse attempted to call report to [facility] without success. Will attempt again shortly.

## 2019-02-15 NOTE — Progress Notes (Signed)
Attempted to call [facility] [phone] to give transfer report; main number just rang and rang; disconnected call

## 2019-02-15 NOTE — TOC Progression Note (Signed)
Transition of Care [facility]) - Progression Note    Patient Details  Name: Katherine [name] MRN: [id number] Date of Birth: March 14, 1940  Transition of Care [facility]) CM/SW [place], LCSW Phone Number: 02/15/2019, 4:58 PM  Clinical Narrative:   [facility] social worker made contact with the patient's spouse who noted that she patient is going to return to skills nursing facility, [facility], [phone].   SW in contact with Claiborne Billings from [facility]. Patient could discharge today. SW will prepare FL2 and discharge paper work will be faxed to [facility] once ready.    Expected Discharge Plan: [place] Barriers to Discharge: Continued Medical Work up  Expected Discharge Plan and Services Expected Discharge Plan: [place] Choice: [place] Living arrangements for the past 2 months: [place]                    Social Determinants of Health (SDOH) Interventions    Readmission Risk Interventions Readmission Risk Prevention Plan 11/26/2018 11/22/2018 07/16/2018  Transportation Screening Complete Complete Complete  Medication Review Press photographer) Complete Complete Complete  PCP or Specialist appointment within 3-5 days of discharge (No Data) - Complete  PCP/Specialist Appt Not Complete comments - - -  HRI or Home Care Consult (No Data) - Complete  SW Recovery Care/Counseling Consult Complete - Not Complete  SW Consult Not Complete Comments - - na  Palliative Care Screening - - Not Applicable  Medication Reconcilation ([place] Complete Complete Complete  Some recent data might be hidden

## 2019-02-15 NOTE — Progress Notes (Signed)
Report called to [facility]; Report received by Connye Burkitt, LPN. AVS package reviewed and to be sent with patient via EMS transport. Will continue to monitor.

## 2019-02-15 NOTE — Progress Notes (Signed)
Physical Therapy Treatment Patient Details Name: Katherine [name] MRN: [id number] DOB: 1940/11/01 Today's Date: 02/15/2019    History of Present Illness Patient is a 79 y.o. 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 Patient presented to [facility] ED after a period off unresponsiveness at [facility]    PT Comments    Patient able to complete EOB therex 12mins with breaks between each and cuing for upright posture for core stabilization/activation in EOB sitting following transfer to EOB (modA). Patient reports fatigue following 51mins EOB therex, and completes additional therex in supine with frequent breaks. O2 monitored throughout session, on room air >95% throughout and patient tolerating session well. Following session patient reports she has a "booger" and pulls a bright red clot from nose and begins bleeding from mouth. Nursing came in to assist and assess situation. Patient left sitting up with bed, reporting she feels "fine", O2 98% with nursing monitoring nose bleed.    Follow Up Recommendations  SNF     Equipment Recommendations       Recommendations for Other Services       Precautions / Restrictions      Mobility  Bed Mobility Overal bed mobility: Needs Assistance Bed Mobility: Supine to Sit;Sit to Supine     Supine to sit: Min assist;HOB elevated Sit to supine: Mod assist   General bed mobility comments: minA supine > sit with use of HOB elevated and handrails; modA sit to supine with assistance needed at [place] transfer comment: unable  Ambulation/Gait                 Stairs             Wheelchair Mobility    Modified Rankin (Stroke Patients Only)       Balance                                            Cognition                                              Exercises General Exercises - Lower Extremity Ankle Circles/Pumps: AROM;20 reps;Supine Long Arc Quad: AROM;10 reps;Both;Seated Heel Slides: AROM;AAROM;Both;10 reps(RLE AAROM; LLE AROM) Hip ABduction/ADduction: 10 reps;Seated;AROM;Both Hip Flexion/Marching: AROM;Both;10 reps;Seated    General Comments        Pertinent Vitals/Pain Pain Assessment: No/denies pain    Home Living                      Prior Function            PT Goals (current goals can now be found in the care plan section) Acute Rehab PT Goals Patient Stated Goal: To sit up and get out of bed more. PT Goal Formulation: With patient Potential to Achieve Goals: Good Additional Goals Additional Goal #1: Pt will be able to perform bed mobility and sit at EOB with min assist to improve functional independence Progress towards PT goals: Progressing toward goals    Frequency    Min 2X/week  PT Plan Current plan remains appropriate    Co-evaluation              AM-PAC PT "6 Clicks" Mobility   Outcome Measure  Help needed turning from your back to your side while in a flat bed without using bedrails?: A Little Help needed moving from lying on your back to sitting on the side of a flat bed without using bedrails?: A Lot Help needed moving to and from a bed to a chair (including a wheelchair)?: Total Help needed standing up from a chair using your arms (e.g., wheelchair or bedside chair)?: Total Help needed to walk in hospital room?: Total Help needed climbing 3-5 steps with a railing? : Total 6 Click Score: 9    End of Session   Activity Tolerance: Treatment limited secondary to medical complications (Comment) Patient left: in bed;with bed alarm set;with nursing/sitter in room Nurse Communication: Mobility status(with patient monitoring nose bleed) PT Visit Diagnosis: Muscle weakness (generalized) (M62.81);History of falling  (Z91.81);Difficulty in walking, not elsewhere classified (R26.2);Pain Pain - part of body: Knee     Time: [id number] PT Time Calculation (min) (ACUTE ONLY): 25 min  Charges:  $Therapeutic Exercise: 23-37 mins                     Shelton Silvas PT, DPT   Shelton Silvas 02/15/2019, 5:20 PM

## 2019-02-16 LAB — CULTURE, BLOOD (ROUTINE X 2)
Culture: NO GROWTH
Special Requests: ADEQUATE

## 2019-02-17 DIAGNOSIS — I509 Heart failure, unspecified: Secondary | ICD-10-CM | POA: Diagnosis not present

## 2019-02-17 DIAGNOSIS — J449 Chronic obstructive pulmonary disease, unspecified: Secondary | ICD-10-CM | POA: Diagnosis not present

## 2019-02-17 DIAGNOSIS — E1159 Type 2 diabetes mellitus with other circulatory complications: Secondary | ICD-10-CM | POA: Diagnosis not present

## 2019-02-17 DIAGNOSIS — J9611 Chronic respiratory failure with hypoxia: Secondary | ICD-10-CM | POA: Diagnosis not present

## 2019-02-17 LAB — CULTURE, BLOOD (ROUTINE X 2)
Culture: NO GROWTH
Culture: NO GROWTH
Special Requests: ADEQUATE

## 2019-03-02 ENCOUNTER — Emergency Department: Payer: Medicare HMO

## 2019-03-02 ENCOUNTER — Other Ambulatory Visit: Payer: Self-pay

## 2019-03-02 ENCOUNTER — Emergency Department
Admission: EM | Admit: 2019-03-02 | Discharge: 2019-03-02 | Disposition: A | Discharge status: Skilled Nursing Facility | Payer: Medicare HMO | Attending: Student in an Organized Health Care Education/Training Program | Admitting: Student in an Organized Health Care Education/Training Program

## 2019-03-02 DIAGNOSIS — E119 Type 2 diabetes mellitus without complications: Secondary | ICD-10-CM | POA: Insufficient documentation

## 2019-03-02 DIAGNOSIS — I509 Heart failure, unspecified: Secondary | ICD-10-CM | POA: Insufficient documentation

## 2019-03-02 DIAGNOSIS — Z87891 Personal history of nicotine dependence: Secondary | ICD-10-CM | POA: Insufficient documentation

## 2019-03-02 DIAGNOSIS — Z7901 Long term (current) use of anticoagulants: Secondary | ICD-10-CM | POA: Insufficient documentation

## 2019-03-02 DIAGNOSIS — Z8673 Personal history of transient ischemic attack (TIA), and cerebral infarction without residual deficits: Secondary | ICD-10-CM | POA: Insufficient documentation

## 2019-03-02 DIAGNOSIS — R569 Unspecified convulsions: Secondary | ICD-10-CM | POA: Diagnosis not present

## 2019-03-02 DIAGNOSIS — J449 Chronic obstructive pulmonary disease, unspecified: Secondary | ICD-10-CM | POA: Insufficient documentation

## 2019-03-02 DIAGNOSIS — I130 Hypertensive heart and chronic kidney disease with heart failure and stage 1 through stage 4 chronic kidney disease, or unspecified chronic kidney disease: Secondary | ICD-10-CM | POA: Insufficient documentation

## 2019-03-02 DIAGNOSIS — J9811 Atelectasis: Secondary | ICD-10-CM | POA: Diagnosis not present

## 2019-03-02 DIAGNOSIS — R4182 Altered mental status, unspecified: Secondary | ICD-10-CM | POA: Diagnosis not present

## 2019-03-02 DIAGNOSIS — U071 COVID-19: Secondary | ICD-10-CM | POA: Diagnosis not present

## 2019-03-02 DIAGNOSIS — G40909 Epilepsy, unspecified, not intractable, without status epilepticus: Secondary | ICD-10-CM | POA: Diagnosis not present

## 2019-03-02 DIAGNOSIS — Z79899 Other long term (current) drug therapy: Secondary | ICD-10-CM | POA: Diagnosis not present

## 2019-03-02 DIAGNOSIS — N183 Chronic kidney disease, stage 3 unspecified: Secondary | ICD-10-CM | POA: Insufficient documentation

## 2019-03-02 LAB — COMPREHENSIVE METABOLIC PANEL
ALT: 44 U/L (ref 0–44)
AST: 111 U/L — ABNORMAL HIGH (ref 15–41)
Albumin: 2.2 g/dL — ABNORMAL LOW (ref 3.5–5.0)
Alkaline Phosphatase: 397 U/L — ABNORMAL HIGH (ref 38–126)
Anion gap: 9 (ref 5–15)
BUN: 19 mg/dL (ref 8–23)
CO2: 23 mmol/L (ref 22–32)
Calcium: 9.4 mg/dL (ref 8.9–10.3)
Chloride: 106 mmol/L (ref 98–111)
Creatinine, Ser: 1.37 mg/dL — ABNORMAL HIGH (ref 0.44–1.00)
GFR calc Af Amer: 43 mL/min — ABNORMAL LOW (ref >60)
GFR calc non Af Amer: 37 mL/min — ABNORMAL LOW (ref >60)
Glucose, Bld: 78 mg/dL (ref 70–99)
Potassium: 4.5 mmol/L (ref 3.5–5.1)
Sodium: 138 mmol/L (ref 135–145)
Total Bilirubin: 1 mg/dL (ref 0.3–1.2)
Total Protein: 7.2 g/dL (ref 6.5–8.1)

## 2019-03-02 LAB — URINALYSIS, COMPLETE (UACMP) WITH MICROSCOPIC
Bilirubin Urine: NEGATIVE
Glucose, UA: NEGATIVE mg/dL
Hgb urine dipstick: NEGATIVE
Ketones, ur: NEGATIVE mg/dL
Nitrite: NEGATIVE
Protein, ur: NEGATIVE mg/dL
Specific Gravity, Urine: 1.013 (ref 1.005–1.030)
Squamous Epithelial / HPF: NONE SEEN (ref 0–5)
WBC, UA: >50 WBC/hpf — ABNORMAL HIGH (ref 0–5)
pH: 5 (ref 5.0–8.0)

## 2019-03-02 LAB — CBC WITH DIFFERENTIAL/PLATELET
Abs Immature Granulocytes: 0.02 10*3/uL (ref 0.00–0.07)
Basophils Absolute: 0 10*3/uL (ref 0.0–0.1)
Basophils Relative: 0 %
Eosinophils Absolute: 0.2 10*3/uL (ref 0.0–0.5)
Eosinophils Relative: 4 %
HCT: 33.7 % — ABNORMAL LOW (ref 36.0–46.0)
Hemoglobin: 10.8 g/dL — ABNORMAL LOW (ref 12.0–15.0)
Immature Granulocytes: 0 %
Lymphocytes Relative: 27 %
Lymphs Abs: 1.5 10*3/uL (ref 0.7–4.0)
MCH: 27.1 pg (ref 26.0–34.0)
MCHC: 32 g/dL (ref 30.0–36.0)
MCV: 84.7 fL (ref 80.0–100.0)
Monocytes Absolute: 0.5 10*3/uL (ref 0.1–1.0)
Monocytes Relative: 8 %
Neutro Abs: 3.3 10*3/uL (ref 1.7–7.7)
Neutrophils Relative %: 61 %
Platelets: 363 10*3/uL (ref 150–400)
RBC: 3.98 MIL/uL (ref 3.87–5.11)
RDW: 18.3 % — ABNORMAL HIGH (ref 11.5–15.5)
WBC: 5.5 10*3/uL (ref 4.0–10.5)
nRBC: 0 % (ref 0.0–0.2)

## 2019-03-02 LAB — AMMONIA: Ammonia: 14 umol/L (ref 9–35)

## 2019-03-02 MED ORDER — LACOSAMIDE 50 MG PO TABS
100.0000 mg | ORAL_TABLET | Freq: Two times a day (BID) | ORAL | Status: DC
  Administered 2019-03-02: 100 mg via ORAL
  Filled 2019-03-02: qty 2

## 2019-03-02 MED ORDER — NITROFURANTOIN MONOHYD MACRO 100 MG PO CAPS: 100.0000 mg | ORAL_CAPSULE | Freq: Two times a day (BID) | ORAL | 0 refills | Status: AC

## 2019-03-02 MED ORDER — ACETAMINOPHEN 500 MG PO TABS
1000.0000 mg | ORAL_TABLET | Freq: Once | ORAL | Status: AC
  Administered 2019-03-02: 1000 mg via ORAL
  Filled 2019-03-02: qty 2

## 2019-03-02 MED ORDER — LEVETIRACETAM IN NACL 1500 MG/100ML IV SOLN
1500.0000 mg | Freq: Once | INTRAVENOUS | Status: AC
  Administered 2019-03-02: 1500 mg via INTRAVENOUS
  Filled 2019-03-02: qty 100

## 2019-03-02 NOTE — ED Notes (Signed)
Attempted to call [facility] X3 and unable to get through. Will continue to try

## 2019-03-02 NOTE — ED Notes (Signed)
Pt unable to sign esign. Facility called and given report and discharge instructions

## 2019-03-02 NOTE — ED Notes (Signed)
Pt given water with medication. Pt able to tolerate well. Pt states her head hurts.

## 2019-03-02 NOTE — Consult Note (Signed)
Requesting Physician: Dr. Quentin Cornwall     Chief Complaint: Altered mental status   History obtained from: Patient and Chart   HPI:                                                                                                                                       Katherine [name] is a 79 y.o. 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  History obtained by EDP and chart review.  Last known normal was 6:30 AM today, when patient was her normal self according to the facility.  Around 9 AM she was checked noted to be altered and having incomprehensible speech.  She was brought to the emergency department at the time was complaining of a headache and would answer yes or no.  Her symptoms continue to improve, and therefore she was not stroke alerted.  There was no witnessed seizure activity.  Patient does not have tongue biting, urinary incontinence.  Patient is on Keppra and Vimpat for seizure, states that she has been compliant on both medications.   CT head was performed which did not show any acute findings.  On my assessment patient was close to her baseline, apart from mild difficulty naming objects.  Her blood pressure was not elevated in the 99991111 mmHg systolic range.  UA revealed leukoesterase.  Per Chart Review  Patient initially presented in 06/2017 with transient episode of aphasia: MRI brain was negative and patient started on Plavix 75 mg daily.  August 2019: Patient presented again in  to [facility] and code stroke was called, received IV TPA.  CTA of the head and neck was unremarkable.  Transferred to [facility], work-up also unrevealing and diagnosis of seizures made and patient started on Keppra.  EEG performed at that time was unremarkable, no epileptiform  discharges were seen.  July 2020: Patient has presented similarly to [facility] [place].   Patient noted to have urinary tract infection.  Patient discharged with same dose of Keppra.  Nov 2020: Diagnosed with COVID-19 viral pneumonitis, mild disease and finished IV steroids and remdesivir, during admission patient had what is likely focal seizures (sudden episodes of unresponsiveness, followed by vomiting).  EEG was performed showed moderate diffuse encephalopathy no seizure or epileptiform discharges.  Keppra increased to 1000 mg twice daily and Vimpat was added  February 11, 2019: Again presented with altered mental status and abnormal speech.  MRI brain was repeated and was negative for stroke.    Urine culture showed no growth.  Patient had transient elevation of liver enzymes which trended downwards.  Ammonia was normal Keppra was increased to 1500 mg twice daily and patient continued on Vimpat 100mg  twice daily.  Keppra level returned back as less than 1.  Other  events: (October 2020: Diagnosed with PE and started on Eliquis)  Has been having progressive decline in mobility due to prolonged hospitalizations, SNF for several months    Past Medical History:  Diagnosis Date  . Angioedema   . Anxiety and depression   . CHF (congestive heart failure) ([place])   . Chronic constipation   . Chronic kidney disease    stage 3  . COPD (chronic obstructive pulmonary disease) ([place])   . Diabetes mellitus without complication ([place])   . Gallstones   . GERD (gastroesophageal reflux disease)   . Hypertension   . Left ventricular hypertrophy   . Osteoarthritis   . Prurigo nodularis   . Seizures ([place])   . Stroke ([place])   . Tobacco abuse   . Vitamin D deficiency disease     Past Surgical History:  Procedure Laterality Date  . CHOLECYSTECTOMY    . 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   Procedure: TOTAL KNEE ARTHROPLASTY;  Surgeon: Hessie Knows, MD;  Location:  [facility] ORS;  Service: Orthopedics;  Laterality: Right;    Family History  Problem Relation Age of Onset  . Alcohol abuse Mother   . Sickle cell anemia Daughter   . Hypertension Son   . Cancer Neg Hx   . COPD Neg Hx   . Diabetes Neg Hx   . Heart disease Neg Hx   . Stroke Neg Hx    Social History:  reports that she has quit smoking. Her smoking use included cigarettes. She has a 30.00 pack-year smoking history. She has never used smokeless tobacco. She reports that she does not drink alcohol or use drugs.  Allergies:  Allergies  Allergen Reactions  . Bee Venom Swelling  . Enalapril Maleate Swelling    Medications:                                                                                                                        I reviewed home medications   ROS:                                                                                                                                     14 systems reviewed and negative except above, patient is a poor historian   Examination:  General: Appears well-developed and well-nourished.  Psych: Affect appropriate to situation Eyes: No scleral injection HENT: No OP obstrucion Head: Normocephalic.  Cardiovascular: Normal rate and regular rhythm.  Respiratory: Effort normal and breath sounds normal to anterior ascultation GI: Soft.  No distension. There is no tenderness.  Skin: WDI    Neurological Examination Mental Status: Alert, oriented, thought content appropriate.  Speech fluent without evidence of aphasia apart from some difficulty naming objects.  Repetition is intact.  Comprehension appears to be intact.  Able to follow simple commands without difficulty. Cranial Nerves: II: Visual fields grossly normal,  III,IV, VI: ptosis not present, extra-ocular motions intact bilaterally, pupils equal, round, reactive to light and  accommodation V,VII: smile symmetric, facial light touch sensation normal bilaterally VIII: hearing normal bilaterally IX,X: uvula rises symmetrically XI: bilateral shoulder shrug XII: midline tongue extension Motor: Right : Upper extremity   4/5    Left:     Upper extremity   4/5  Lower extremity   3/5     Lower extremity   3/5 Tone and bulk:normal tone throughout; no atrophy noted Sensory: Pinprick and light touch intact throughout, bilaterally Deep Tendon Reflexes: 3+ reflexes in biceps, brachioradialis bilaterally.  Approximately 2+ in bilateral patella however not cooperative due to joint pain. Plantars: Right: downgoing   Left: downgoing Cerebellar: normal finger-to-nose bilaterally   Lab Results: Basic Metabolic Panel: Recent Labs  Lab 03/02/19 1009  NA 138  K 4.5  CL 106  CO2 23  GLUCOSE 78  BUN 19  CREATININE 1.37*  CALCIUM 9.4    CBC: Recent Labs  Lab 03/02/19 1009  WBC 5.5  NEUTROABS 3.3  HGB 10.8*  HCT 33.7*  MCV 84.7  PLT 363    Coagulation Studies: No results for input(s): LABPROT, INR in the last 72 hours.  Imaging: CT Head Wo Contrast  Result Date: 03/02/2019 CLINICAL DATA:  Altered mental status. EXAM: CT HEAD WITHOUT CONTRAST TECHNIQUE: Contiguous axial images were obtained from the base of the skull through the vertex without intravenous contrast. COMPARISON:  February 14, 2019. FINDINGS: Brain: Mild chronic ischemic white matter disease is noted. Mild diffuse cortical atrophy is noted. No mass effect or midline shift is noted. Ventricular size is within normal limits. There is no evidence of mass lesion, hemorrhage or acute infarction. Vascular: No hyperdense vessel or unexpected calcification. Skull: Normal. Negative for fracture or focal lesion. Sinuses/Orbits: No acute finding. Other: None. IMPRESSION: Mild chronic ischemic white matter disease. Mild diffuse cortical atrophy. No acute intracranial abnormality seen. Electronically Signed   By:  Marijo Conception M.D.   On: 03/02/2019 10:49   DG Chest Portable 1 View  Result Date: 03/02/2019 CLINICAL DATA:  COVID positive.  Altered mental status. EXAM: PORTABLE CHEST 1 VIEW COMPARISON:  02/14/2019, CT 12/20/2018 FINDINGS: Normal cardiac silhouette. There is chronic interstitial thickening and atelectasis in the RIGHT lower lobe seen on comparison radiograph and CT. No acute focal airspace disease identified. No pneumothorax. No acute osseous abnormality. IMPRESSION: 1. No acute cardiopulmonary findings. 2. Chronic pleural thickening and atelectasis at the RIGHT lung base. Electronically Signed   By: Suzy Bouchard M.D.   On: 03/02/2019 10:46     I have reviewed the above imaging : CT head, unremarkable no acute findings   ASSESSMENT AND PLAN  79 y.o. 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  Patient has rapidly returning back to her baseline, differentials include focal seizure with postictal state vs complex migraine.  Focal seizure could be provoked by UTI, however not sure if this is asymptomatic bacteriuria.  ED planning on treating as if it were UTI.  My concern with diagnosis of seizures is that she is now on 2 antiepileptics and continues to have very breakthrough spells ( although possibly provoked by UTI) , with 2x EEG not showing any epileptiform discharges.  Granted that these are routine EEGs and does not rule out seizure disorder, I am hesitant to keep escalating antiepileptic agents without a clear diagnosis.  I do think she would benefit from long-term EEG monitoring to further characterize the spells. Also interesting, was her Keppra level during last admission was less than 1, however patient states that she is compliant.  The level has been  repeated and important to be followed up on.    Acute metabolic encephalopathy Possible breakthrough seizure with postictal state Possible urinary tract infection versus asymptomatic bacteriuria  Recommendations Continue Keppra 1500 mg twice daily ( max dose given CKD) Continue Vimpat 100 mg twice daily Repeat Keppra levels Given recurrent episodes, I do think she would benefit from a inpatient admission to the epilepsy monitoring unitto better characterize episodes given that  EEGx2 have been negative and she continues to have episodes of altered mental status/aphasia repeatedly. Patient is almost back to her baseline and therefore I do not think MRI brain is warranted.  Outpatient neurology follow-up and 2 weeks. Will discuss with Dr. Hortense Ramal for planning inpatient admission.  Irie Dowson [facility] Pager Number [phone]

## 2019-03-02 NOTE — ED Notes (Signed)
MD at bedside. 

## 2019-03-02 NOTE — ED Provider Notes (Addendum)
[facility] Emergency Department Provider Note    First MD Initiated Contact with Patient 03/02/19 1004     (approximate)  I have reviewed the triage vital signs and the nursing notes.   HISTORY  Chief Complaint Altered Mental Status  Level V Caveat:  AMS  HPI Katherine [name] is a 79 y.o. female with extensive past medical history as listed below with recent mission the hospital for altered mental status presents to the ER due to confusion altered mental status and slurred speech.  Has had similar presentations before.  Does have a history of seizures as well as CVA.   She is also on anticoagulation history of PE.  No reported fevers.  Patient unable to provide much history right now.   Past Medical History:  Diagnosis Date  . Angioedema   . Anxiety and depression   . CHF (congestive heart failure) ([place])   . Chronic constipation   . Chronic kidney disease    stage 3  . COPD (chronic obstructive pulmonary disease) ([place])   . Diabetes mellitus without complication ([place])   . Gallstones   . GERD (gastroesophageal reflux disease)   . Hypertension   . Left ventricular hypertrophy   . Osteoarthritis   . Prurigo nodularis   . Seizures ([place])   . Stroke ([place])   . Tobacco abuse   . Vitamin D deficiency disease    Family History  Problem Relation Age of Onset  . Alcohol abuse Mother   . Sickle cell anemia Daughter   . Hypertension Son   . Cancer Neg Hx   . COPD Neg Hx   . Diabetes Neg Hx   . Heart disease Neg Hx   . Stroke Neg Hx    Past Surgical History:  Procedure Laterality Date  . CHOLECYSTECTOMY    . 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   Procedure: TOTAL KNEE ARTHROPLASTY;  Surgeon: Hessie Knows, MD;  Location: [facility] ORS;  Service: Orthopedics;  Laterality: Right;   Patient Active Problem List   Diagnosis Date Noted  . Iron deficiency anemia   . Altered mental status   . Acute encephalopathy  02/11/2019  . Sepsis secondary to UTI ([place]) 01/08/2019  . Acute renal failure superimposed on stage 3b chronic kidney disease ([place]) 01/08/2019  . Seizure disorder ([place]) 01/08/2019  . Diabetes mellitus type 2, controlled, with complications ([place]) [id number]  . Acute on chronic anemia 01/03/2019  . COVID-19 12/21/2018  . COVID-19 virus infection 12/21/2018  . Hemoptysis 12/20/2018  . Pulmonary embolism ([place]) 11/21/2018  . Delirium   . UTI (urinary tract infection) 08/21/2018  . Chest pain 07/15/2018  . Palliative care encounter 05/10/2018  . Localized edema 05/10/2018  . Shortness of breath 05/10/2018  . Hematemesis 04/28/2018  . Influenza A 04/13/2018  . Acute on chronic congestive heart failure ([place])   . COPD with acute exacerbation ([place]) 02/24/2018  . Chronic diastolic heart failure ([place]) 12/31/2017  . Lymphedema 12/31/2017  . COPD exacerbation ([place]) 11/30/2017  . Urinary tract infection 11/22/2017  . 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[place]) 11/16/2017  . Diabetes mellitus type 2, uncomplicated ([place]) 0000000  . Pressure injury of skin 10/29/2017  . Acute kidney injury superimposed on CKD ([place]) 10/24/2017  . Sepsis ([place]) 10/24/2017  . Possible Seizures ([place]) 10/08/2017  . Hyperlipemia 10/08/2017  . 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 Stroke-like episode ([place]) s/p IV tpa 10/04/2017  . Palliative  care by specialist   . Elevated rheumatoid factor 09/05/2017  . Frequent hospital admissions 08/22/2017  . Aphasia 06/28/2017  . Right hand pain 03/21/2017  . Dry skin 03/21/2017  . Pain in finger of left hand 09/12/2016  . Chronic fatigue 06/12/2016  . Left knee pain 06/12/2016  . Goals of care, counseling/discussion 03/13/2016  . Primary localized osteoarthritis of right knee 02/08/2016  . OSA (obstructive sleep apnea) 09/16/2015  . Rotator cuff syndrome 09/07/2015  . 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 Sleep disturbance 04/14/2015  . Coronary artery  disease 03/14/2015  . 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 Acute respiratory failure with hypoxia ([place]) 09/16/2014  . 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 Anxiety   . Tobacco abuse   . Prurigo nodularis   . GERD (gastroesophageal reflux disease)   . COPD (chronic obstructive pulmonary disease) ([place])   . Osteoarthritis   . Vitamin D deficiency disease   . Chronic constipation   . CKD (chronic kidney disease), stage III   . Essential hypertension 05/20/2013  . Morbid obesity ([place]) 05/20/2013      Prior to Admission medications   Medication Sig Start Date End Date Taking? Authorizing Provider  acetaminophen (TYLENOL) 650 MG CR tablet Take 650 mg by mouth 3 (three) times daily.     [provider]  albuterol (ACCUNEB) 0.63 MG/3ML nebulizer solution Take 3 mLs by nebulization every 8 (eight) hours as needed for wheezing.     [provider]  apixaban (ELIQUIS) 5 MG TABS tablet Take 1 tablet (5 mg total) by mouth 2 (two) times daily. To initiate this on 11/29/2018 after completing the 10 mg twice daily treatment regimen 11/29/18   Otila Back, MD  atorvastatin (LIPITOR) 80 MG tablet Take 80 mg by mouth every evening.     [provider]  cetirizine (ZYRTEC) 10 MG tablet Take 5 mg by mouth at bedtime.    [provider]  cholecalciferol (VITAMIN D) 1000 units tablet Take 1,000 Units by mouth daily.    [provider]  diclofenac sodium (VOLTAREN) 1 % GEL Apply 2 g topically every 12 (twelve) hours as needed (for left knee pain).     [provider]  docusate sodium (COLACE) 100 MG capsule Take 100 mg by mouth 2 (two) times daily.    [provider]  ferrous sulfate 325 (65 FE) MG tablet Take 1 tablet (325 mg total) by mouth 2 (two) times daily with a meal. 02/15/19 03/17/19  Wyvonnia Dusky, MD  FLUoxetine (PROZAC) 40 MG capsule Take 40 mg by mouth daily.     [provider]  Fluticasone-Umeclidin-Vilant 100-62.5-25 MCG/INH  AEPB Inhale 1 puff into the lungs daily.    [provider]  gabapentin (NEURONTIN) 300 MG capsule Take 300 mg by mouth at bedtime.    [provider]  ipratropium (ATROVENT) 0.03 % nasal spray Place 2 sprays into both nostrils 2 (two) times daily.     [provider]  ipratropium-albuterol (DUONEB) 0.5-2.5 (3) MG/3ML SOLN Take 3 mLs by nebulization 3 (three) times daily. 07/17/18   Gladstone Lighter, MD  lacosamide 100 MG TABS Take 1 tablet (100 mg total) by mouth 2 (two) times daily. 12/31/18   Thurnell Lose, MD  levETIRAcetam 750 MG TB24 Take 2 tablets (1,500 mg total) by mouth daily. 02/16/19 03/18/19  Wyvonnia Dusky, MD  linagliptin (TRADJENTA) 5 MG TABS tablet Take 5 mg by mouth daily.    [provider]  meclizine (ANTIVERT) 25 MG tablet Take 25 mg by mouth every 12 (twelve) hours as needed for dizziness.     [provider]  Multiple Vitamin (MULTIVITAMIN WITH MINERALS) TABS tablet Take 1 tablet by mouth daily.    [provider]  mupirocin ointment (BACTROBAN) 2 % Place 1 application into the nose 2 (two) times daily. 06/13/18   Loletha Grayer, MD  nitrofurantoin, macrocrystal-monohydrate, (MACROBID) 100 MG capsule Take 1 capsule (100 mg total) by mouth 2 (two) times daily for 3 days. 03/02/19 03/05/19  Merlyn Lot, MD  nitroGLYCERIN (NITROSTAT) 0.4 MG SL tablet Place 0.4 mg under the tongue every 5 (five) minutes as needed for chest pain.  08/07/18   [provider]  pantoprazole (PROTONIX) 40 MG tablet Take 1 tablet (40 mg total) by mouth daily. 04/29/18   Bettey Costa, MD  vitamin C (VITAMIN C) 500 MG tablet Take 1 tablet (500 mg total) by mouth daily. 01/12/19   Allie Bossier, MD  zinc sulfate 220 (50 Zn) MG capsule Take 1 capsule (220 mg total) by mouth daily. 01/12/19   Allie Bossier, MD    Allergies Bee venom and Enalapril maleate    Social History Social History   Tobacco Use  . Smoking status: Former  Smoker    Packs/day: 0.50    Years: 60.00    Pack years: 30.00    Types: Cigarettes  . Smokeless tobacco: Never Used  . Tobacco comment: quite 72mo ago-03/26/18  Substance Use Topics  . Alcohol use: No    Alcohol/week: 0.0 standard drinks    Comment: rare  . Drug use: No    Review of Systems Patient denies headaches, rhinorrhea, blurry vision, numbness, shortness of breath, chest pain, edema, cough, abdominal pain, nausea, vomiting, diarrhea, dysuria, fevers, rashes or hallucinations unless otherwise stated above in HPI. ____________________________________________   PHYSICAL EXAM:  VITAL SIGNS: Vitals:   03/02/19 1323 03/02/19 1330  BP: 102/69 108/81  Pulse: 96 95  Resp: 20 (!) 21  Temp: 98.1 F (36.7 C)   SpO2: 100% 99%    Constitutional: Alert unintelligible speech Eyes: Conjunctivae are normal.  Head: Atraumatic. Nose: No congestion/rhinnorhea. Mouth/Throat: Mucous membranes are moist.   Neck: No stridor. Painless ROM.  Cardiovascular: Normal rate, regular rhythm. Grossly normal heart sounds.  Good peripheral circulation. Respiratory: Normal respiratory effort.  No retractions. Lungs CTAB. Gastrointestinal: Soft and nontender. No distention. No abdominal bruits. No CVA tenderness. Genitourinary:  Musculoskeletal: No lower extremity tenderness nor edema.  No joint effusions. Neurologic:  No facial droop.  No ptosis, MAE.  Following simple commands Skin:  Skin is warm, dry and intact. No rash noted. Psychiatric: Mood and affect are normal. Speech and behavior are normal.  ____________________________________________   LABS (all labs ordered are listed, but only abnormal results are displayed)  Results for orders placed or performed during the hospital encounter of 03/02/19 (from the past 24 hour(s))  CBC with Differential     Status: Abnormal   Collection Time: 03/02/19 10:09 AM  Result Value Ref Range   WBC 5.5 4.0 - 10.5 K/uL   RBC 3.98 3.87 - 5.11 MIL/uL    Hemoglobin 10.8 (L) 12.0 - 15.0 g/dL   HCT 33.7 (L) 36.0 - 46.0 %   MCV 84.7 80.0 - 100.0 fL   MCH 27.1 26.0 - 34.0 pg   MCHC 32.0 30.0 - 36.0 g/dL   RDW 18.3 (H) 11.5 - 15.5 %   Platelets 363 150 - 400 K/uL  nRBC 0.0 0.0 - 0.2 %   Neutrophils Relative % 61 %   Neutro Abs 3.3 1.7 - 7.7 K/uL   Lymphocytes Relative 27 %   Lymphs Abs 1.5 0.7 - 4.0 K/uL   Monocytes Relative 8 %   Monocytes Absolute 0.5 0.1 - 1.0 K/uL   Eosinophils Relative 4 %   Eosinophils Absolute 0.2 0.0 - 0.5 K/uL   Basophils Relative 0 %   Basophils Absolute 0.0 0.0 - 0.1 K/uL   Immature Granulocytes 0 %   Abs Immature Granulocytes 0.02 0.00 - 0.07 K/uL  Comprehensive metabolic panel     Status: Abnormal   Collection Time: 03/02/19 10:09 AM  Result Value Ref Range   Sodium 138 135 - 145 mmol/L   Potassium 4.5 3.5 - 5.1 mmol/L   Chloride 106 98 - 111 mmol/L   CO2 23 22 - 32 mmol/L   Glucose, Bld 78 70 - 99 mg/dL   BUN 19 8 - 23 mg/dL   Creatinine, Ser 1.37 (H) 0.44 - 1.00 mg/dL   Calcium 9.4 8.9 - 10.3 mg/dL   Total Protein 7.2 6.5 - 8.1 g/dL   Albumin 2.2 (L) 3.5 - 5.0 g/dL   AST 111 (H) 15 - 41 U/L   ALT 44 0 - 44 U/L   Alkaline Phosphatase 397 (H) 38 - 126 U/L   Total Bilirubin 1.0 0.3 - 1.2 mg/dL   GFR calc non Af Amer 37 (L) >60 mL/min   GFR calc Af Amer 43 (L) >60 mL/min   Anion gap 9 5 - 15  Urinalysis, Complete w Microscopic     Status: Abnormal   Collection Time: 03/02/19 10:09 AM  Result Value Ref Range   Color, Urine YELLOW (A) YELLOW   APPearance CLOUDY (A) CLEAR   Specific Gravity, Urine 1.013 1.005 - 1.030   pH 5.0 5.0 - 8.0   Glucose, UA NEGATIVE NEGATIVE mg/dL   Hgb urine dipstick NEGATIVE NEGATIVE   Bilirubin Urine NEGATIVE NEGATIVE   Ketones, ur NEGATIVE NEGATIVE mg/dL   Protein, ur NEGATIVE NEGATIVE mg/dL   Nitrite NEGATIVE NEGATIVE   Leukocytes,Ua LARGE (A) NEGATIVE   RBC / HPF 0-5 0 - 5 RBC/hpf   WBC, UA >50 (H) 0 - 5 WBC/hpf   Bacteria, UA RARE (A) NONE SEEN   Squamous  Epithelial / LPF NONE SEEN 0 - 5   WBC Clumps PRESENT    Mucus PRESENT   Ammonia     Status: None   Collection Time: 03/02/19 11:46 AM  Result Value Ref Range   Ammonia 14 9 - 35 umol/L   ____________________________________________  EKG My review and personal interpretation at Time: 9:48   Indication: ams  Rate: 100  Rhythm: sinus Axis: normal Other: normal intervals, no stemi ____________________________________________  RADIOLOGY  I personally reviewed all radiographic images ordered to evaluate for the above acute complaints and reviewed radiology reports and findings.  These findings were personally discussed with the patient.  Please see medical record for radiology report.  ____________________________________________   PROCEDURES  Procedure(s) performed:  Procedures    Critical Care performed: no ____________________________________________   INITIAL IMPRESSION / ASSESSMENT AND PLAN / ED COURSE  Pertinent labs & imaging results that were available during my care of the patient were reviewed by me and considered in my medical decision making (see chart for details).   DDX: Dehydration, sepsis, pna, uti, hypoglycemia, cva, drug effect, withdrawal, encephalitis   Katherine [name] is a 79 y.o. who presents to  the ED with sx as described above.  Patient is AFVSS in ED. Exam as above. Given current presentation have considered the above differential.   Patient protecting airway.  Seems to follow simple commands but dysarthric.  Seems to have had similar presentations in the past.  The patient will be placed on continuous pulse oximetry and telemetry for monitoring.  Laboratory evaluation will be sent to evaluate for the above complaints.     Clinical Course as of Mar 01 1400  Sun Mar 02, 2019  1135 Patient reassessed.  Now with fluent speech.  She is complaining of mild headache and states that she has these frequently and takes Tylenol for them.  Denies any weakness.   No blurry vision.  Fairly similar presentations in the past.  Suspicious for persistent seizure but does not appear to be in status.  Reportedly is compliant with her medications.  I will discuss in consultation with neurology for further recommendations.   [PR]  1230 She is still with fluid speech at this point.  Remains hemodynamically stable.  I have discussed with neurology who kindly agrees to consult evaluate patient at bedside.   [PR]  1335 Patient has been evaluated by neurology.  Appreciate their input.  Not showing any signs of persistent deficit at this time.  Does not seem consistent with status.  Possibly having breakthrough seizures but she is already on a pretty high dose.  Patient states that she has been compliant with her medications.  Headache resolved.  Tolerating PO.  At this point do not see any indication for hospitalization.  At this point do believe she is appropriate for outpatient follow-up.   [PR]    Clinical Course User Index [PR] Merlyn Lot, MD    The patient was evaluated in Emergency Department today for the symptoms described in the history of present illness. He/she was evaluated in the context of the global COVID-19 pandemic, which necessitated consideration that the patient might be at risk for infection with the SARS-CoV-2 virus that causes COVID-19. Institutional protocols and algorithms that pertain to the evaluation of patients at risk for COVID-19 are in a state of rapid change based on information released by regulatory bodies including the CDC and federal and state organizations. These policies and algorithms were followed during the patient's care in the ED.  As part of my medical decision making, I reviewed the following data within the [place] notes reviewed and incorporated, Labs reviewed, notes from prior ED visits and [place] Controlled Substance Database   ____________________________________________   FINAL CLINICAL  IMPRESSION(S) / ED DIAGNOSES  Final diagnoses:  Seizure-like activity ([place])      NEW MEDICATIONS STARTED DURING THIS VISIT:  New Prescriptions   NITROFURANTOIN, MACROCRYSTAL-MONOHYDRATE, (MACROBID) 100 MG CAPSULE    Take 1 capsule (100 mg total) by mouth 2 (two) times daily for 3 days.     Note:  This document was prepared using Dragon voice recognition software and may include unintentional dictation errors.    Merlyn Lot, MD 03/02/19 1355    Merlyn Lot, MD 03/02/19 [phone]

## 2019-03-02 NOTE — ED Notes (Signed)
Pt had large BM. Pt cleaned up and new clean brief applied. Pt has stage 2 sore noted to left inner thigh. No bleeding  Or drainage noted

## 2019-03-02 NOTE — ED Notes (Signed)
Called pharmacy and per Cristie Hem they are about to tube medication

## 2019-03-02 NOTE — ED Notes (Signed)
Patient transported to CT 

## 2019-03-02 NOTE — ED Notes (Signed)
Pt checked and dry at this time. 

## 2019-03-02 NOTE — ED Triage Notes (Signed)
Pt comes via [facility] with c/o AMS. Pt is from [facility] and facility states this am around 0630 pt was fine. Pt checked on again around 0900 and was AMS.  EMS reports VSS, BS-[id number]  Pt arrives mumbling and unable to comprehend what she is saying. Pt is able to node yes to pain and points to her head.  Pt wears 2L  chronically  Pt has hx of pulmonary embolism

## 2019-03-02 NOTE — ED Notes (Signed)
Pt given meal tray and eating at this time.

## 2019-03-03 LAB — URINE CULTURE: Culture: NO GROWTH

## 2019-03-04 DIAGNOSIS — J9620 Acute and chronic respiratory failure, unspecified whether with hypoxia or hypercapnia: Secondary | ICD-10-CM | POA: Diagnosis not present

## 2019-03-04 DIAGNOSIS — G40909 Epilepsy, unspecified, not intractable, without status epilepticus: Secondary | ICD-10-CM | POA: Diagnosis not present

## 2019-03-04 DIAGNOSIS — E785 Hyperlipidemia, unspecified: Secondary | ICD-10-CM | POA: Diagnosis not present

## 2019-03-04 DIAGNOSIS — E1169 Type 2 diabetes mellitus with other specified complication: Secondary | ICD-10-CM | POA: Diagnosis not present

## 2019-03-06 LAB — LEVETIRACETAM LEVEL: Levetiracetam Lvl: 1 ug/mL — ABNORMAL LOW (ref 10.0–40.0)

## 2019-04-01 DIAGNOSIS — J449 Chronic obstructive pulmonary disease, unspecified: Secondary | ICD-10-CM | POA: Diagnosis not present

## 2019-04-01 DIAGNOSIS — R040 Epistaxis: Secondary | ICD-10-CM | POA: Diagnosis not present

## 2019-04-01 DIAGNOSIS — Z7901 Long term (current) use of anticoagulants: Secondary | ICD-10-CM | POA: Diagnosis not present

## 2019-04-01 DIAGNOSIS — I2699 Other pulmonary embolism without acute cor pulmonale: Secondary | ICD-10-CM | POA: Diagnosis not present

## 2019-04-01 DIAGNOSIS — Z96651 Presence of right artificial knee joint: Secondary | ICD-10-CM | POA: Diagnosis not present

## 2019-04-01 DIAGNOSIS — I714 Abdominal aortic aneurysm, without rupture: Secondary | ICD-10-CM | POA: Diagnosis not present

## 2019-04-01 DIAGNOSIS — Z743 Need for continuous supervision: Secondary | ICD-10-CM | POA: Diagnosis not present

## 2019-04-01 DIAGNOSIS — Z8673 Personal history of transient ischemic attack (TIA), and cerebral infarction without residual deficits: Secondary | ICD-10-CM | POA: Diagnosis not present

## 2019-04-01 DIAGNOSIS — Z79899 Other long term (current) drug therapy: Secondary | ICD-10-CM | POA: Diagnosis not present

## 2019-04-01 DIAGNOSIS — N183 Chronic kidney disease, stage 3 unspecified: Secondary | ICD-10-CM | POA: Diagnosis not present

## 2019-04-01 DIAGNOSIS — J9611 Chronic respiratory failure with hypoxia: Secondary | ICD-10-CM | POA: Diagnosis not present

## 2019-04-01 DIAGNOSIS — E1122 Type 2 diabetes mellitus with diabetic chronic kidney disease: Secondary | ICD-10-CM | POA: Diagnosis not present

## 2019-04-01 DIAGNOSIS — I130 Hypertensive heart and chronic kidney disease with heart failure and stage 1 through stage 4 chronic kidney disease, or unspecified chronic kidney disease: Secondary | ICD-10-CM | POA: Diagnosis not present

## 2019-04-01 DIAGNOSIS — I509 Heart failure, unspecified: Secondary | ICD-10-CM | POA: Diagnosis not present

## 2019-04-01 DIAGNOSIS — I2510 Atherosclerotic heart disease of native coronary artery without angina pectoris: Secondary | ICD-10-CM | POA: Diagnosis not present

## 2019-04-01 DIAGNOSIS — F29 Unspecified psychosis not due to a substance or known physiological condition: Secondary | ICD-10-CM | POA: Diagnosis not present

## 2019-04-01 DIAGNOSIS — R58 Hemorrhage, not elsewhere classified: Secondary | ICD-10-CM | POA: Diagnosis not present

## 2019-04-01 DIAGNOSIS — U071 COVID-19: Secondary | ICD-10-CM | POA: Diagnosis not present

## 2019-04-01 DIAGNOSIS — R042 Hemoptysis: Secondary | ICD-10-CM | POA: Diagnosis not present

## 2019-04-01 DIAGNOSIS — I5032 Chronic diastolic (congestive) heart failure: Secondary | ICD-10-CM | POA: Diagnosis not present

## 2019-04-01 DIAGNOSIS — Z8616 Personal history of COVID-19: Secondary | ICD-10-CM | POA: Diagnosis not present

## 2019-04-01 DIAGNOSIS — G4733 Obstructive sleep apnea (adult) (pediatric): Secondary | ICD-10-CM | POA: Diagnosis not present

## 2019-04-01 DIAGNOSIS — R52 Pain, unspecified: Secondary | ICD-10-CM | POA: Diagnosis not present

## 2019-04-01 DIAGNOSIS — R279 Unspecified lack of coordination: Secondary | ICD-10-CM | POA: Diagnosis not present

## 2019-04-01 DIAGNOSIS — E1165 Type 2 diabetes mellitus with hyperglycemia: Secondary | ICD-10-CM | POA: Diagnosis not present

## 2019-04-01 DIAGNOSIS — Z87891 Personal history of nicotine dependence: Secondary | ICD-10-CM | POA: Diagnosis not present

## 2019-04-05 ENCOUNTER — Other Ambulatory Visit: Payer: Self-pay

## 2019-04-05 ENCOUNTER — Emergency Department
Admission: EM | Admit: 2019-04-05 | Discharge: 2019-04-05 | Disposition: A | Discharge status: Home / Self Care | Payer: Medicare HMO | Attending: Student in an Organized Health Care Education/Training Program | Admitting: Student in an Organized Health Care Education/Training Program

## 2019-04-05 DIAGNOSIS — E1122 Type 2 diabetes mellitus with diabetic chronic kidney disease: Secondary | ICD-10-CM | POA: Insufficient documentation

## 2019-04-05 DIAGNOSIS — I130 Hypertensive heart and chronic kidney disease with heart failure and stage 1 through stage 4 chronic kidney disease, or unspecified chronic kidney disease: Secondary | ICD-10-CM | POA: Diagnosis not present

## 2019-04-05 DIAGNOSIS — R040 Epistaxis: Secondary | ICD-10-CM | POA: Insufficient documentation

## 2019-04-05 DIAGNOSIS — Z96651 Presence of right artificial knee joint: Secondary | ICD-10-CM | POA: Insufficient documentation

## 2019-04-05 DIAGNOSIS — Z79899 Other long term (current) drug therapy: Secondary | ICD-10-CM | POA: Insufficient documentation

## 2019-04-05 DIAGNOSIS — I509 Heart failure, unspecified: Secondary | ICD-10-CM | POA: Insufficient documentation

## 2019-04-05 DIAGNOSIS — Z87891 Personal history of nicotine dependence: Secondary | ICD-10-CM | POA: Insufficient documentation

## 2019-04-05 DIAGNOSIS — Z8616 Personal history of COVID-19: Secondary | ICD-10-CM | POA: Insufficient documentation

## 2019-04-05 DIAGNOSIS — Z8673 Personal history of transient ischemic attack (TIA), and cerebral infarction without residual deficits: Secondary | ICD-10-CM | POA: Insufficient documentation

## 2019-04-05 DIAGNOSIS — N183 Chronic kidney disease, stage 3 unspecified: Secondary | ICD-10-CM | POA: Diagnosis not present

## 2019-04-05 DIAGNOSIS — J449 Chronic obstructive pulmonary disease, unspecified: Secondary | ICD-10-CM | POA: Diagnosis not present

## 2019-04-05 DIAGNOSIS — I2510 Atherosclerotic heart disease of native coronary artery without angina pectoris: Secondary | ICD-10-CM | POA: Diagnosis not present

## 2019-04-05 DIAGNOSIS — Z7901 Long term (current) use of anticoagulants: Secondary | ICD-10-CM | POA: Insufficient documentation

## 2019-04-05 MED ORDER — PROBIOTIC 250 MG PO CAPS: 1.0000 | ORAL_CAPSULE | Freq: Two times a day (BID) | ORAL | 0 refills | Status: DC

## 2019-04-05 MED ORDER — LIDOCAINE-EPINEPHRINE 2 %-1:100000 IJ SOLN
20.0000 mL | Freq: Once | INTRAMUSCULAR | Status: AC
  Administered 2019-04-05: 20 mL
  Filled 2019-04-05: qty 1

## 2019-04-05 MED ORDER — AMOXICILLIN-POT CLAVULANATE 875-125 MG PO TABS: 1.0000 | ORAL_TABLET | Freq: Two times a day (BID) | ORAL | 0 refills | Status: AC

## 2019-04-05 MED ORDER — OXYMETAZOLINE HCL 0.05 % NA SOLN
1.0000 | Freq: Once | NASAL | Status: AC
  Administered 2019-04-05: 1 via NASAL
  Filled 2019-04-05: qty 30

## 2019-04-05 NOTE — ED Provider Notes (Signed)
[facility] Emergency Department Provider Note    First MD Initiated Contact with Patient 04/05/19 [phone]     (approximate)  I have reviewed the triage vital signs and the nursing notes.   HISTORY  Chief Complaint Epistaxis    HPI Katherine [name] is a 79 y.o. female Katherine [name] a past medical history presents from nursing facility due to epistaxis subsequently developed coughing up several clots of blood.  Bleeding seems to have subsided.  She does wear chronic O2.  Denies any pain.  No shortness of breath.  Does not recall this happening before.   She is on eliquis.   Past Medical History:  Diagnosis Date  . Angioedema   . Anxiety and depression   . CHF (congestive heart failure) ([place])   . Chronic constipation   . Chronic kidney disease    stage 3  . COPD (chronic obstructive pulmonary disease) ([place])   . Diabetes mellitus without complication ([place])   . Gallstones   . GERD (gastroesophageal reflux disease)   . Hypertension   . Left ventricular hypertrophy   . Osteoarthritis   . Prurigo nodularis   . Seizures ([place])   . Stroke ([place])   . Tobacco abuse   . Vitamin D deficiency disease    Family History  Problem Relation Age of Onset  . Alcohol abuse Mother   . Sickle cell anemia Daughter   . Hypertension Son   . Cancer Neg Hx   . COPD Neg Hx   . Diabetes Neg Hx   . Heart disease Neg Hx   . Stroke Neg Hx    Past Surgical History:  Procedure Laterality Date  . CHOLECYSTECTOMY    . 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   Procedure: TOTAL KNEE ARTHROPLASTY;  Surgeon: Hessie Knows, MD;  Location: [facility] ORS;  Service: Orthopedics;  Laterality: Right;   Patient Active Problem List   Diagnosis Date Noted  . Iron deficiency anemia   . Altered mental status   . Acute encephalopathy 02/11/2019  . Sepsis secondary to UTI ([place]) 01/08/2019  . Acute renal failure superimposed on stage 3b chronic kidney disease ([place])  01/08/2019  . Seizure disorder ([place]) 01/08/2019  . Diabetes mellitus type 2, controlled, with complications ([place]) [id number]  . Acute on chronic anemia 01/03/2019  . COVID-19 12/21/2018  . COVID-19 virus infection 12/21/2018  . Hemoptysis 12/20/2018  . Pulmonary embolism ([place]) 11/21/2018  . Delirium   . UTI (urinary tract infection) 08/21/2018  . Chest pain 07/15/2018  . Palliative care encounter 05/10/2018  . Localized edema 05/10/2018  . Shortness of breath 05/10/2018  . Hematemesis 04/28/2018  . Influenza A 04/13/2018  . Acute on chronic congestive heart failure ([place])   . COPD with acute exacerbation ([place]) 02/24/2018  . Chronic diastolic heart failure ([place]) 12/31/2017  . Lymphedema 12/31/2017  . COPD exacerbation ([place]) 11/30/2017  . Urinary tract infection 11/22/2017  . 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[place]) 11/16/2017  . Diabetes mellitus type 2, uncomplicated ([place]) 0000000  . Pressure injury of skin 10/29/2017  . Acute kidney injury superimposed on CKD ([place]) 10/24/2017  . Sepsis ([place]) 10/24/2017  . Possible Seizures ([place]) 10/08/2017  . Hyperlipemia 10/08/2017  . 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 Stroke-like episode ([place]) s/p IV tpa 10/04/2017  . Palliative care by specialist   . Elevated rheumatoid factor 09/05/2017  . Frequent hospital admissions 08/22/2017  . Aphasia 06/28/2017  .  Right hand pain 03/21/2017  . Dry skin 03/21/2017  . Pain in finger of left hand 09/12/2016  . Chronic fatigue 06/12/2016  . Left knee pain 06/12/2016  . Goals of care, counseling/discussion 03/13/2016  . Primary localized osteoarthritis of right knee 02/08/2016  . OSA (obstructive sleep apnea) 09/16/2015  . Rotator cuff syndrome 09/07/2015  . 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 Sleep disturbance 04/14/2015  . Coronary artery disease 03/14/2015  . 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 Acute respiratory failure with hypoxia ([place]) 09/16/2014  . Lichen simplex  chronicus 08/12/2014  . Anxiety   . Tobacco abuse   . Prurigo nodularis   . GERD (gastroesophageal reflux disease)   . COPD (chronic obstructive pulmonary disease) ([place])   . Osteoarthritis   . Vitamin D deficiency disease   . Chronic constipation   . CKD (chronic kidney disease), stage III   . Essential hypertension 05/20/2013  . Morbid obesity ([place]) 05/20/2013      Prior to Admission medications   Medication Sig Start Date End Date Taking? Authorizing Provider  acetaminophen (TYLENOL) 650 MG CR tablet Take 650 mg by mouth 3 (three) times daily.     [provider]  albuterol (ACCUNEB) 0.63 MG/3ML nebulizer solution Take 3 mLs by nebulization every 8 (eight) hours as needed for wheezing.     [provider]  amoxicillin-clavulanate (AUGMENTIN) 875-125 MG tablet Take 1 tablet by mouth 2 (two) times daily for 10 days. 04/05/19 04/15/19  Merlyn Lot, MD  apixaban (ELIQUIS) 5 MG TABS tablet Take 1 tablet (5 mg total) by mouth 2 (two) times daily. To initiate this on 11/29/2018 after completing the 10 mg twice daily treatment regimen 11/29/18   Otila Back, MD  atorvastatin (LIPITOR) 80 MG tablet Take 80 mg by mouth every evening.     [provider]  cetirizine (ZYRTEC) 10 MG tablet Take 5 mg by mouth at bedtime.    [provider]  cholecalciferol (VITAMIN D) 1000 units tablet Take 1,000 Units by mouth daily.    [provider]  diclofenac sodium (VOLTAREN) 1 % GEL Apply 2 g topically every 12 (twelve) hours as needed (for left knee pain).     [provider]  docusate sodium (COLACE) 100 MG capsule Take 100 mg by mouth 2 (two) times daily.    [provider]  ferrous sulfate 325 (65 FE) MG tablet Take 1 tablet (325 mg total) by mouth 2 (two) times daily with a meal. 02/15/19 03/17/19  [place]yvonnia Dusky, MD  FLUoxetine (PROZAC) 40 MG capsule Take 40 mg by mouth daily.     [provider]  Fluticasone-Umeclidin-Vilant  100-62.5-25 MCG/INH AEPB Inhale 1 puff into the lungs daily.    [provider]  gabapentin (NEURONTIN) 300 MG capsule Take 300 mg by mouth at bedtime.    [provider]  ipratropium (ATROVENT) 0.03 % nasal spray Place 2 sprays into both nostrils 2 (two) times daily.     [provider]  ipratropium-albuterol (DUONEB) 0.5-2.5 (3) MG/3ML SOLN Take 3 mLs by nebulization 3 (three) times daily. 07/17/18   Gladstone Lighter, MD  lacosamide 100 MG TABS Take 1 tablet (100 mg total) by mouth 2 (two) times daily. 12/31/18   Thurnell Lose, MD  levETIRAcetam 750 MG TB24 Take 2 tablets (1,500 mg total) by mouth daily. 02/16/19 03/18/19  Wyvonnia Dusky, MD  linagliptin (TRADJENTA) 5 MG TABS tablet Take 5 mg by mouth daily.    [provider]  meclizine (ANTIVERT) 25 MG tablet Take 25 mg by mouth every 12 (twelve) hours as needed for dizziness.     [provider]  Multiple Vitamin (MULTIVITAMIN WITH MINERALS) TABS tablet Take 1 tablet by mouth daily.    [provider]  mupirocin ointment (BACTROBAN) 2 % Place 1 application into the nose 2 (two) times daily. 06/13/18   Loletha Grayer, MD  nitroGLYCERIN (NITROSTAT) 0.4 MG SL tablet Place 0.4 mg under the tongue every 5 (five) minutes as needed for chest pain.  08/07/18   [provider]  pantoprazole (PROTONIX) 40 MG tablet Take 1 tablet (40 mg total) by mouth daily. 04/29/18   Bettey Costa, MD  Saccharomyces boulardii (PROBIOTIC) 250 MG CAPS Take 1 capsule by mouth 2 times daily at 12 noon and 4 pm. 04/05/19   Merlyn Lot, MD  vitamin C (VITAMIN C) 500 MG tablet Take 1 tablet (500 mg total) by mouth daily. 01/12/19   Allie Bossier, MD  zinc sulfate 220 (50 Zn) MG capsule Take 1 capsule (220 mg total) by mouth daily. 01/12/19   Allie Bossier, MD    Allergies Bee venom and Enalapril maleate    Social History Social History   Tobacco Use  . Smoking status: Former Smoker     Packs/day: 0.50    Years: 60.00    Pack years: 30.00    Types: Cigarettes  . Smokeless tobacco: Never Used  . Tobacco comment: quite 40mo ago-03/26/18  Substance Use Topics  . Alcohol use: No    Alcohol/week: 0.0 standard drinks    Comment: rare  . Drug use: No    Review of Systems Patient denies headaches, rhinorrhea, blurry vision, numbness, shortness of breath, chest pain, edema, cough, abdominal pain, nausea, vomiting, diarrhea, dysuria, fevers, rashes or hallucinations unless otherwise stated above in HPI. ____________________________________________   PHYSICAL EXAM:  VITAL SIGNS: Vitals:   04/05/19 0915  BP: 100/67  Pulse: 94  Resp: 19  SpO2: 99%    Constitutional: Alert and in NAD  Eyes: Conjunctivae are normal.  Head: Atraumatic. Nose: No congestion/rhinnorhea. Evidence of recently bleeding erosion of bilateral anterior nasal passage that appear c/w irritation and drying from [place]. No blood in posterior oropharynx    Mouth/Throat: Mucous membranes are moist.   Neck: No stridor. Painless ROM.  Cardiovascular: Normal rate, regular rhythm. Grossly normal heart sounds.  Good peripheral circulation. Respiratory: Normal respiratory effort.  No retractions. Lungs with bilateral wheeze. Gastrointestinal: Soft and nontender. No distention. No abdominal bruits. No CVA tenderness. Genitourinary: deferred Musculoskeletal: No lower extremity tenderness nor edema.  No joint effusions. Neurologic:   No gross focal neurologic deficits are appreciated. No facial droop Skin:  Skin is warm, dry and intact. No rash noted. Psychiatric: Mood and affect are normal. Speech and behavior are normal.  ____________________________________________   LABS (all labs ordered are listed, but only abnormal results are displayed)  No results found for this or any previous visit (from the past 24  hour(s)). ____________________________________________  EKG____________________________________________  [id number]   ____________________________________________   PROCEDURES  Procedure(s) performed:  .Epistaxis Management  Date/Time: 04/05/2019 10:01 AM Performed by: Merlyn Lot, MD Authorized by: Merlyn Lot, MD   Consent:    Consent obtained:  Verbal   Consent given by:  Patient   Risks discussed:  Bleeding, infection, nasal injury and pain   Alternatives discussed:  Delayed treatment, alternative treatment, observation and referral Anesthesia (see MAR for exact dosages):    Anesthesia method:  Topical application   Topical anesthesia: lidocaine. Procedure details:    Treatment site:  L anterior and R anterior   Treatment method:  Merocel sponge   Treatment episode: initial   Post-procedure details:    Assessment:  Bleeding stopped   Patient tolerance of procedure:  Tolerated well, no immediate complications      Critical Care performed: no ____________________________________________   INITIAL IMPRESSION / ASSESSMENT AND PLAN / ED COURSE  Pertinent labs & imaging results that were available during my care of the patient were reviewed by me and considered in my medical decision making (see chart for details).   DDX: anterior epistaxis, posterior epistaxis,  hemoptysis  Teagyn Stalling is a 79 y.o. who presents to the ED with evidence of bleeding and epistaxis.  Presentation is complicated as she is on Eliquis.  She is otherwise well perfused no respiratory distress she is satting well on her chronic O2.  Abdominal exam and remainder of physical exam is at baseline.  No signs or symptoms of symptomatic anemia.  Will anesthetize and evaluate for cautery vs packing.  No evidence of posterior epistaxis.  Clinical Course as of Apr 04 1006  Sat Apr 05, 2019  0959 Bilateral nares were found to have what appear to be chronic erosive changes likely secondary  to nasal cannula use.  Bilateral areas did require cautery with improvement in bleeding but still is very slow ooze therefore it was determined to place packing with Merocel sponge to bilateral anterior nares.  Again after observation there is no evidence of posterior bleed.  She remains hemodynamically stable satting well.  No hypotension.  Will relay to facility that after the packings were removed that she would likely benefit from humidified oxygen but this can be discussed with her PCP.   [PR]    Clinical Course User Index [PR] Merlyn Lot, MD    The patient was evaluated in Emergency Department today for the symptoms described in the history of present illness. He/she was evaluated in the context of the global COVID-19 pandemic, which necessitated consideration that the patient might be at risk for infection with the SARS-CoV-2 virus that causes COVID-19. Institutional protocols and algorithms that pertain to the evaluation of patients at risk for COVID-19 are in a state of rapid change based on information released by regulatory bodies including the CDC and federal and state organizations. These policies and algorithms were followed during the patient's care in the ED.  As part of my medical decision making, I reviewed the following data within the [facility] notes reviewed and incorporated, Labs reviewed, notes from prior ED visits and [place] Controlled Substance Database   ____________________________________________   FINAL CLINICAL IMPRESSION(S) / ED DIAGNOSES  Final diagnoses:  Anterior epistaxis      NEW MEDICATIONS STARTED DURING THIS VISIT:  New Prescriptions   AMOXICILLIN-CLAVULANATE (AUGMENTIN) 875-125 MG TABLET    Take 1 tablet by mouth 2 (two) times daily for 10 days.   SACCHAROMYCES BOULARDII (PROBIOTIC) 250 MG CAPS    Take 1 capsule by mouth 2 times daily at 12 noon and 4 pm.     Note:  This document was prepared using Dragon voice  recognition software and may include unintentional dictation errors.    Merlyn Lot, MD 04/05/19 [phone]

## 2019-04-05 NOTE — ED Triage Notes (Signed)
Pt arrives via EMS from [facility] for a nosebleed with no active bleeding at this time- per the staff at the nursing home pt was coughing up clots from her nose bleed- pt does take eloquis- pt tested covid positive last Tuesday- pt normally wears O2 at 2L

## 2019-04-05 NOTE — Discharge Instructions (Addendum)
Please follow up with ENT in 7 days for packing removal.  It appears your chronic oxygen is causing your nasal passages to become dry and more prone to bleeding.  Please discuss with your PCP and ENT possible humidified oxygen and/or additional DME that may help prevent this in the future.

## 2019-04-05 NOTE — ED Notes (Signed)
Pt unable to sign for discharge d/t dementia 

## 2019-04-07 DIAGNOSIS — N183 Chronic kidney disease, stage 3 unspecified: Secondary | ICD-10-CM | POA: Diagnosis not present

## 2019-04-07 DIAGNOSIS — R040 Epistaxis: Secondary | ICD-10-CM | POA: Diagnosis not present

## 2019-04-07 DIAGNOSIS — I509 Heart failure, unspecified: Secondary | ICD-10-CM | POA: Diagnosis not present

## 2019-04-14 DIAGNOSIS — R040 Epistaxis: Secondary | ICD-10-CM | POA: Diagnosis not present

## 2019-05-07 DIAGNOSIS — R569 Unspecified convulsions: Secondary | ICD-10-CM | POA: Diagnosis not present

## 2019-05-07 DIAGNOSIS — I509 Heart failure, unspecified: Secondary | ICD-10-CM | POA: Diagnosis not present

## 2019-06-08 ENCOUNTER — Other Ambulatory Visit: Payer: Self-pay

## 2019-06-08 ENCOUNTER — Emergency Department: Payer: Medicare HMO

## 2019-06-08 ENCOUNTER — Emergency Department
Admission: EM | Admit: 2019-06-08 | Discharge: 2019-06-08 | Disposition: A | Discharge status: Home / Self Care | Payer: Medicare HMO | Attending: Emergency Medicine | Admitting: Emergency Medicine

## 2019-06-08 ENCOUNTER — Encounter: Payer: Self-pay | Admitting: Emergency Medicine

## 2019-06-08 DIAGNOSIS — R5381 Other malaise: Secondary | ICD-10-CM | POA: Diagnosis not present

## 2019-06-08 DIAGNOSIS — I5032 Chronic diastolic (congestive) heart failure: Secondary | ICD-10-CM | POA: Diagnosis not present

## 2019-06-08 DIAGNOSIS — E1122 Type 2 diabetes mellitus with diabetic chronic kidney disease: Secondary | ICD-10-CM | POA: Insufficient documentation

## 2019-06-08 DIAGNOSIS — Z7984 Long term (current) use of oral hypoglycemic drugs: Secondary | ICD-10-CM | POA: Insufficient documentation

## 2019-06-08 DIAGNOSIS — I130 Hypertensive heart and chronic kidney disease with heart failure and stage 1 through stage 4 chronic kidney disease, or unspecified chronic kidney disease: Secondary | ICD-10-CM | POA: Insufficient documentation

## 2019-06-08 DIAGNOSIS — Z87891 Personal history of nicotine dependence: Secondary | ICD-10-CM | POA: Insufficient documentation

## 2019-06-08 DIAGNOSIS — N183 Chronic kidney disease, stage 3 unspecified: Secondary | ICD-10-CM | POA: Insufficient documentation

## 2019-06-08 DIAGNOSIS — I2699 Other pulmonary embolism without acute cor pulmonale: Secondary | ICD-10-CM | POA: Diagnosis not present

## 2019-06-08 DIAGNOSIS — Z96651 Presence of right artificial knee joint: Secondary | ICD-10-CM | POA: Diagnosis not present

## 2019-06-08 DIAGNOSIS — J9811 Atelectasis: Secondary | ICD-10-CM | POA: Diagnosis not present

## 2019-06-08 DIAGNOSIS — I10 Essential (primary) hypertension: Secondary | ICD-10-CM | POA: Diagnosis not present

## 2019-06-08 DIAGNOSIS — Z7901 Long term (current) use of anticoagulants: Secondary | ICD-10-CM | POA: Diagnosis not present

## 2019-06-08 DIAGNOSIS — R049 Hemorrhage from respiratory passages, unspecified: Secondary | ICD-10-CM | POA: Diagnosis present

## 2019-06-08 DIAGNOSIS — R0902 Hypoxemia: Secondary | ICD-10-CM | POA: Diagnosis not present

## 2019-06-08 DIAGNOSIS — J449 Chronic obstructive pulmonary disease, unspecified: Secondary | ICD-10-CM | POA: Diagnosis not present

## 2019-06-08 DIAGNOSIS — M2550 Pain in unspecified joint: Secondary | ICD-10-CM | POA: Diagnosis not present

## 2019-06-08 DIAGNOSIS — R040 Epistaxis: Secondary | ICD-10-CM

## 2019-06-08 DIAGNOSIS — Z8616 Personal history of COVID-19: Secondary | ICD-10-CM | POA: Insufficient documentation

## 2019-06-08 DIAGNOSIS — R58 Hemorrhage, not elsewhere classified: Secondary | ICD-10-CM | POA: Diagnosis not present

## 2019-06-08 DIAGNOSIS — Z8673 Personal history of transient ischemic attack (TIA), and cerebral infarction without residual deficits: Secondary | ICD-10-CM | POA: Insufficient documentation

## 2019-06-08 DIAGNOSIS — Z7401 Bed confinement status: Secondary | ICD-10-CM | POA: Diagnosis not present

## 2019-06-08 LAB — PROTIME-INR
INR: 1.2 (ref 0.8–1.2)
Prothrombin Time: 14.9 seconds (ref 11.4–15.2)

## 2019-06-08 LAB — TYPE AND SCREEN
ABO/RH(D): O POS
Antibody Screen: NEGATIVE

## 2019-06-08 LAB — COMPREHENSIVE METABOLIC PANEL
ALT: 13 U/L (ref 0–44)
AST: 33 U/L (ref 15–41)
Albumin: 2.4 g/dL — ABNORMAL LOW (ref 3.5–5.0)
Alkaline Phosphatase: 164 U/L — ABNORMAL HIGH (ref 38–126)
Anion gap: 7 (ref 5–15)
BUN: 23 mg/dL (ref 8–23)
CO2: 25 mmol/L (ref 22–32)
Calcium: 10 mg/dL (ref 8.9–10.3)
Chloride: 105 mmol/L (ref 98–111)
Creatinine, Ser: 1.42 mg/dL — ABNORMAL HIGH (ref 0.44–1.00)
GFR calc Af Amer: 41 mL/min — ABNORMAL LOW (ref >60)
GFR calc non Af Amer: 36 mL/min — ABNORMAL LOW (ref >60)
Glucose, Bld: 99 mg/dL (ref 70–99)
Potassium: 5.3 mmol/L — ABNORMAL HIGH (ref 3.5–5.1)
Sodium: 137 mmol/L (ref 135–145)
Total Bilirubin: 0.9 mg/dL (ref 0.3–1.2)
Total Protein: 7.5 g/dL (ref 6.5–8.1)

## 2019-06-08 LAB — CBC
HCT: 34.1 % — ABNORMAL LOW (ref 36.0–46.0)
Hemoglobin: 10.9 g/dL — ABNORMAL LOW (ref 12.0–15.0)
MCH: 29.2 pg (ref 26.0–34.0)
MCHC: 32 g/dL (ref 30.0–36.0)
MCV: 91.4 fL (ref 80.0–100.0)
Platelets: 286 10*3/uL (ref 150–400)
RBC: 3.73 MIL/uL — ABNORMAL LOW (ref 3.87–5.11)
RDW: 15.9 % — ABNORMAL HIGH (ref 11.5–15.5)
WBC: 5.1 10*3/uL (ref 4.0–10.5)
nRBC: 0 % (ref 0.0–0.2)

## 2019-06-08 LAB — APTT: aPTT: <24 seconds — ABNORMAL LOW (ref 24–36)

## 2019-06-08 MED ORDER — LIDOCAINE 5 % EX PTCH
1.0000 | MEDICATED_PATCH | CUTANEOUS | Status: DC
  Administered 2019-06-08: 18:00:00 1 via TRANSDERMAL
  Filled 2019-06-08: qty 1

## 2019-06-08 MED ORDER — SODIUM CHLORIDE 0.9 % IV BOLUS
1000.0000 mL | Freq: Once | INTRAVENOUS | Status: AC
  Administered 2019-06-08: 1000 mL via INTRAVENOUS

## 2019-06-08 MED ORDER — CEPHALEXIN 500 MG PO CAPS: 500.0000 mg | ORAL_CAPSULE | Freq: Two times a day (BID) | ORAL | 0 refills | Status: DC

## 2019-06-08 NOTE — ED Notes (Signed)
Report to [facility] at [facility]

## 2019-06-08 NOTE — ED Provider Notes (Signed)
[facility] Emergency Department Provider Note   ____________________________________________    I have reviewed the triage vital signs and the nursing notes.   HISTORY  Chief Complaint Hemorrhage     HPI Katherine [name] is a 79 y.o. female with significant past medical history as detailed below including CHF, CKD, COPD, diabetes, PE, on Eliquis who presents with nosebleed.  EMS was called for hemorrhaging they found the patient "gurgling with blood coming out of her mouth ".  No reports of trauma.  Unclear whether the patient wears oxygen.  Patient unable to provide significant history at this time.  Past Medical History:  Diagnosis Date  . Angioedema   . Anxiety and depression   . CHF (congestive heart failure) ([place])   . Chronic constipation   . Chronic kidney disease    stage 3  . COPD (chronic obstructive pulmonary disease) ([place])   . Diabetes mellitus without complication ([place])   . Gallstones   . GERD (gastroesophageal reflux disease)   . Hypertension   . Left ventricular hypertrophy   . Osteoarthritis   . Prurigo nodularis   . Seizures ([place])   . Stroke ([place])   . Tobacco abuse   . Vitamin D deficiency disease     Patient Active Problem List   Diagnosis Date Noted  . Iron deficiency anemia   . Altered mental status   . Acute encephalopathy 02/11/2019  . Sepsis secondary to UTI ([place]) 01/08/2019  . Acute renal failure superimposed on stage 3b chronic kidney disease ([place]) 01/08/2019  . Seizure disorder ([place]) 01/08/2019  . Diabetes mellitus type 2, controlled, with complications ([place]) 37/16/9678  . Acute on chronic anemia 01/03/2019  . COVID-19 12/21/2018  . COVID-19 virus infection 12/21/2018  . Hemoptysis 12/20/2018  . Pulmonary embolism ([place]) 11/21/2018  . Delirium   . UTI (urinary tract infection) 08/21/2018  . Chest pain 07/15/2018  . Palliative care encounter 05/10/2018  . Localized edema 05/10/2018  . Shortness of breath  05/10/2018  . Hematemesis 04/28/2018  . Influenza A 04/13/2018  . Acute on chronic congestive heart failure ([place])   . COPD with acute exacerbation ([place]) 02/24/2018  . Chronic diastolic heart failure ([place]) 12/31/2017  . Lymphedema 12/31/2017  . COPD exacerbation ([place]) 11/30/2017  . Urinary tract infection 11/22/2017  . 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[place]) 11/16/2017  . Diabetes mellitus type 2, uncomplicated ([place]) 93/81/0175  . Pressure injury of skin 10/29/2017  . Acute kidney injury superimposed on CKD ([place]) 10/24/2017  . Sepsis ([place]) 10/24/2017  . Possible Seizures ([place]) 10/08/2017  . Hyperlipemia 10/08/2017  . 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 Stroke-like episode ([place]) s/p IV tpa 10/04/2017  . Palliative care by specialist   . Elevated rheumatoid factor 09/05/2017  . Frequent hospital admissions 08/22/2017  . Aphasia 06/28/2017  . Right hand pain 03/21/2017  . Dry skin 03/21/2017  . Pain in finger of left hand 09/12/2016  . Chronic fatigue 06/12/2016  . Left knee pain 06/12/2016  . Goals of care, counseling/discussion 03/13/2016  . Primary localized osteoarthritis of right knee 02/08/2016  . OSA (obstructive sleep apnea) 09/16/2015  . Rotator cuff syndrome 09/07/2015  . 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 Sleep disturbance 04/14/2015  . Coronary artery disease 03/14/2015  . 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 Acute respiratory failure with hypoxia ([place]) 09/16/2014  . 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 Anxiety   . Tobacco abuse   . Prurigo nodularis   .  GERD (gastroesophageal reflux disease)   . COPD (chronic obstructive pulmonary disease) ([place])   . Osteoarthritis   . Vitamin D deficiency disease   . Chronic constipation   . CKD (chronic kidney disease), stage III   . Essential hypertension 05/20/2013  . Morbid obesity ([place]) 05/20/2013    Past Surgical History:  Procedure Laterality Date  . CHOLECYSTECTOMY    . POLYPECTOMY  11/2011     vocal cord  . 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   Procedure: TOTAL KNEE ARTHROPLASTY;  Surgeon: Hessie Knows, MD;  Location: [facility] ORS;  Service: Orthopedics;  Laterality: Right;    Prior to Admission medications   Medication Sig Start Date End Date Taking? Authorizing Provider  acetaminophen (TYLENOL) 650 MG CR tablet Take 650 mg by mouth 3 (three) times daily.    Yes [provider]  albuterol (ACCUNEB) 0.63 MG/3ML nebulizer solution Take 3 mLs by nebulization every 8 (eight) hours as needed for wheezing.    Yes [provider]  apixaban (ELIQUIS) 5 MG TABS tablet Take 1 tablet (5 mg total) by mouth 2 (two) times daily. To initiate this on 11/29/2018 after completing the 10 mg twice daily treatment regimen Patient taking differently: Take 5 mg by mouth 2 (two) times daily.  11/29/18  Yes Ojie, Jude, MD  atorvastatin (LIPITOR) 80 MG tablet Take 80 mg by mouth every evening.    Yes [provider]  cetirizine (ZYRTEC) 10 MG tablet Take 5 mg by mouth daily.    Yes [provider]  cholecalciferol (VITAMIN D) 1000 units tablet Take 1,000 Units by mouth daily.   Yes [provider]  docusate sodium (COLACE) 100 MG capsule Take 100 mg by mouth 2 (two) times daily.   Yes [provider]  ferrous sulfate 325 (65 FE) MG tablet Take 1 tablet (325 mg total) by mouth 2 (two) times daily with a meal. 02/15/19 06/08/19 Yes Wyvonnia Dusky, MD  Fluticasone-Umeclidin-Vilant 100-62.5-25 MCG/INH AEPB Inhale 1 puff into the lungs daily.   Yes [provider]  gabapentin (NEURONTIN) 300 MG capsule Take 300 mg by mouth at bedtime.   Yes [provider]  ipratropium-albuterol (DUONEB) 0.5-2.5 (3) MG/3ML SOLN Take 3 mLs by nebulization 3 (three) times daily. 07/17/18  Yes Gladstone Lighter, MD  lacosamide 100 MG TABS Take 1 tablet (100 mg total) by mouth 2 (two) times daily. 12/31/18  Yes Thurnell Lose, MD  levETIRAcetam 750 MG TB24  Take 2 tablets (1,500 mg total) by mouth daily. 02/16/19 06/08/19 Yes Wyvonnia Dusky, MD  linagliptin (TRADJENTA) 5 MG TABS tablet Take 5 mg by mouth daily.   Yes [provider]  meclizine (ANTIVERT) 25 MG tablet Take 25 mg by mouth every 12 (twelve) hours as needed for dizziness.    Yes [provider]  Menthol, Topical Analgesic, (BIOFREEZE) 4 % GEL Apply topically in the morning and at bedtime. To both knees   Yes [provider]  mupirocin ointment (BACTROBAN) 2 % Place 1 application into the nose 2 (two) times daily. 06/13/18  Yes Wieting, Richard, MD  nitroGLYCERIN (NITROSTAT) 0.4 MG SL tablet Place 0.4 mg under the tongue every 5 (five) minutes as needed for chest pain.  08/07/18  Yes [provider]  pantoprazole (PROTONIX) 40 MG tablet Take 1 tablet (40 mg total) by mouth daily. 04/29/18  Yes Bettey Costa, MD  Saccharomyces boulardii (PROBIOTIC) 250 MG CAPS Take 1 capsule by mouth 2 times daily at 12 noon and  4 pm. 04/05/19  Yes Merlyn Lot, MD  Saline GEL Place 2 sprays into the nose in the morning, at noon, and at bedtime.   Yes [provider]  vitamin C (VITAMIN C) 500 MG tablet Take 1 tablet (500 mg total) by mouth daily. 01/12/19  Yes Allie Bossier, MD  zinc sulfate 220 (50 Zn) MG capsule Take 1 capsule (220 mg total) by mouth daily. 01/12/19  Yes Allie Bossier, MD  cephALEXin (KEFLEX) 500 MG capsule Take 1 capsule (500 mg total) by mouth 2 (two) times daily. 06/08/19   Lavonia Drafts, MD     Allergies Bee venom and Enalapril maleate  Family History  Problem Relation Age of Onset  . Alcohol abuse Mother   . Sickle cell anemia Daughter   . Hypertension Son   . Cancer Neg Hx   . COPD Neg Hx   . Diabetes Neg Hx   . Heart disease Neg Hx   . Stroke Neg Hx     Social History Social History   Tobacco Use  . Smoking status: Former Smoker    Packs/day: 0.50    Years: 60.00    Pack years: 30.00    Types: Cigarettes  .  Smokeless tobacco: Never Used  . Tobacco comment: quite 76mo ago-03/26/18  Substance Use Topics  . Alcohol use: No    Alcohol/week: 0.0 standard drinks    Comment: rare  . Drug use: No    Review of Systems  Constitutional: No reports of fever or chill Eyes: No visual changes.  ENT: Epistaxis Cardiovascular: Denies chest pain. Respiratory: EMS reports shortness of breath Gastrointestinal: No abdominal pain.   Genitourinary: Negative for dysuria. Musculoskeletal: Negative for back pain. Skin: Negative for rash. Neurological: Negative for headaches   ____________________________________________   PHYSICAL EXAM:  VITAL SIGNS: ED Triage Vitals  Enc Vitals Group     BP 06/08/19 1343 98/68     Pulse Rate 06/08/19 1343 (!) 111     Resp 06/08/19 1343 (!) 28     Temp 06/08/19 1343 97.7 F (36.5 C)     Temp Source 06/08/19 1343 Oral     SpO2 06/08/19 1343 93 %     Weight 06/08/19 1341 95.3 kg (210 lb 1.6 oz)     Height 06/08/19 1341 1.6 m (5\' 3" )     Head Circumference --      Peak Flow --      Pain Score 06/08/19 1341 0     Pain Loc --      Pain Edu? --      Excl. in [place]? --     Constitutional: Alert  Eyes: Conjunctivae are normal.  Head: Atraumatic. Nose: Epistaxis from right naris Mouth/Throat: Mucous membranes are moist.  Blood is coming down the pharynx into the mouth and the patient is spitting it up Neck:  Painless ROM Cardiovascular: Normal rate, regular rhythm.  Good peripheral circulation. Respiratory: Normal respiratory effort.  No retractions. Lungs CTAB. Gastrointestinal: Soft and nontender. No distention.   Musculoskeletal: No lower extremity tenderness nor edema.  Warm and well perfused Neurologic:  . No gross focal neurologic deficits are appreciated.  Skin:  Skin is warm, dry and intact. No rash noted.   ____________________________________________   LABS (all labs ordered are listed, but only abnormal results are displayed)  Labs Reviewed  CBC  - Abnormal; Notable for the following components:      Result Value   RBC 3.73 (*)    Hemoglobin  10.9 (*)    HCT 34.1 (*)    RDW 15.9 (*)    All other components within normal limits  COMPREHENSIVE METABOLIC PANEL - Abnormal; Notable for the following components:   Potassium 5.3 (*)    Creatinine, Ser 1.42 (*)    Albumin 2.4 (*)    Alkaline Phosphatase 164 (*)    GFR calc non Af Amer 36 (*)    GFR calc Af Amer 41 (*)    All other components within normal limits  APTT - Abnormal; Notable for the following components:   aPTT <24 (*)    All other components within normal limits  PROTIME-INR  TYPE AND SCREEN  TYPE AND SCREEN   ____________________________________________  EKG  ED ECG REPORT I, Lavonia Drafts, the attending physician, personally viewed and interpreted this ECG.  Date: 06/08/2019  Rhythm: normal sinus rhythm QRS Axis: normal Intervals: Anterior fascicular block ST/T Wave abnormalities: normal Narrative Interpretation: no evidence of acute ischemia  ____________________________________________  RADIOLOGY  Chest x-ray viewed by me, unremarkable ____________________________________________   PROCEDURES  Procedure(s) performed: yes  .Epistaxis Management  Date/Time: 06/08/2019 2:25 PM Performed by: Lavonia Drafts, MD Authorized by: Lavonia Drafts, MD   Consent:    Consent obtained:  Verbal   Consent given by:  Patient   Risks discussed:  Bleeding, infection, nasal injury and pain   Alternatives discussed:  No treatment Anesthesia (see MAR for exact dosages):    Anesthesia method:  Topical application   Topical anesthetic:  Lidocaine gel Procedure details:    Treatment site:  R anterior   Treatment method:  Merocel sponge   Treatment complexity:  Limited   Treatment episode: initial   Post-procedure details:    Assessment:  Bleeding decreased   Patient tolerance of procedure:  Tolerated with difficulty     Critical Care performed:  No ____________________________________________   INITIAL IMPRESSION / ASSESSMENT AND PLAN / ED COURSE  Pertinent labs & imaging results that were available during my care of the patient were reviewed by me and considered in my medical decision making (see chart for details).  Patient presents with reports of shortness of breath, hemorrhage of unknown origin.  It appears that she is bleeding out of her nose, specifically her right naris and that is going down her pharynx into her mouth.  Does not appear to be in significant extremis at this time, nasal tampon placed as noted above with decreased bleeding.  She is on Eliquis however.  Labs pending  CBC demonstrates hemoglobin appears to be stable.  ----------------------------------------- 2:37 PM on 06/08/2019 ----------------------------------------- Patient had episode of hypotension I did order 500 cc of fluid with rapid improvement in blood pressure.  Do not believe the patient has lost a significant amount of blood rather she may have had a vasovagal reaction from choking on bloody clot which caused her to almost vomit.  ----------------------------------------- 2:58 PM on 06/08/2019 -----------------------------------------  Patient remains normotensive, lab work is overall reassuring.  Merocel appears to have stopped nosebleed. She is comfortable and appears to be at her baseline.   We will watch the patient in the ED to make sure she remains normotensive. If so, anticipate discharge. Outpatient follow up     ____________________________________________   FINAL CLINICAL IMPRESSION(S) / ED DIAGNOSES  Final diagnoses:  Epistaxis        Note:  This document was prepared using Dragon voice recognition software and may include unintentional dictation errors.   Lavonia Drafts, MD 06/08/19 [phone]

## 2019-06-08 NOTE — ED Notes (Signed)
Pt told RN she wears 2L [place] chronically, placed on 2L [place] at this time.

## 2019-06-08 NOTE — ED Triage Notes (Signed)
Pt presents to ED via [facility] from [facility] with c/o profuse hemorrhage. Per EMS pt with bleeding noted from mouth and nose, per EMS pt gurgling blood en route. Per EMS pt with initial RA sats 87%, suctioned several times PTA and placed on NRB 98% with interventions. Per EMS unable to obtain BP, HR 120s. Per EMS staff at facility report bleeding started at approx 1245, UTA where bleeding coming from.  Upon arrival to ED bleeding appears somewhat controlled, however after being moved over to ED stretcher, pt noted to have epistaxis.    EDP at bedside at this time.

## 2019-06-08 NOTE — ED Notes (Signed)
EMS at bedside to transport patient back to facility.

## 2019-06-08 NOTE — ED Provider Notes (Signed)
-----------------------------------------   3:08 PM on 06/08/2019 -----------------------------------------  Blood pressure 100/79, pulse 88, temperature 97.7 F (36.5 C), temperature source Oral, resp. rate (!) 22, height 5\' 3"  (1.6 m), weight 95.3 kg, SpO2 100 %.  Assuming care from Dr. Corky Downs.  In short, Katherine [name] is a 79 y.o. female with a chief complaint of Hemorrhage .  Refer to the original H&P for additional details.  The current plan of care is to continue to monitor for recurrence of epistaxis, if bleeding remains controlled and patient normotensive, she will be appropriate for discharge home with ENT follow-up.  ----------------------------------------- 5:46 PM on 06/08/2019 -----------------------------------------  On reevaluation, patient has not had any recurrence of bleeding, breathing comfortably and maintaining O2 sats on her usual 2 L nasal cannula.  She has remained normotensive here in the ED.  She is appropriate for discharge home with nasal tampon in place, provided with referral to ENT as well as short course of antibiotics.  Patient agrees with plan.     Blake Divine, MD 06/08/19 [phone]

## 2019-06-08 NOTE — ED Notes (Signed)
EDP made aware of patient's hypotension at this time, at bedside to assess patient. Pt noted to be diaphoretic, c/o increasing fatigue, c/o her feet are cold but her also c/o feeling hot. Warm blanket placed on patient's feet.

## 2019-06-08 NOTE — Discharge Instructions (Signed)
Please follow up with Ear Nose and Throat doctor to remove the nasal tampon.

## 2019-06-10 DIAGNOSIS — R569 Unspecified convulsions: Secondary | ICD-10-CM | POA: Diagnosis not present

## 2019-06-10 DIAGNOSIS — I509 Heart failure, unspecified: Secondary | ICD-10-CM | POA: Diagnosis not present

## 2019-06-10 DIAGNOSIS — R040 Epistaxis: Secondary | ICD-10-CM | POA: Diagnosis not present

## 2019-06-16 DIAGNOSIS — R040 Epistaxis: Secondary | ICD-10-CM | POA: Diagnosis not present

## 2019-06-19 ENCOUNTER — Inpatient Hospital Stay
Admission: EM | Admit: 2019-06-19 | Discharge: 2019-06-22 | DRG: 871 | Disposition: A | Discharge status: Skilled Nursing Facility | Payer: Medicare HMO | Source: Skilled Nursing Facility | Attending: Internal Medicine | Admitting: Internal Medicine

## 2019-06-19 ENCOUNTER — Emergency Department: Payer: Medicare HMO

## 2019-06-19 ENCOUNTER — Other Ambulatory Visit: Payer: Self-pay

## 2019-06-19 DIAGNOSIS — M2550 Pain in unspecified joint: Secondary | ICD-10-CM | POA: Diagnosis not present

## 2019-06-19 DIAGNOSIS — R1111 Vomiting without nausea: Secondary | ICD-10-CM | POA: Diagnosis not present

## 2019-06-19 DIAGNOSIS — A419 Sepsis, unspecified organism: Principal | ICD-10-CM | POA: Diagnosis present

## 2019-06-19 DIAGNOSIS — Z86711 Personal history of pulmonary embolism: Secondary | ICD-10-CM

## 2019-06-19 DIAGNOSIS — J9610 Chronic respiratory failure, unspecified whether with hypoxia or hypercapnia: Secondary | ICD-10-CM | POA: Diagnosis present

## 2019-06-19 DIAGNOSIS — I2510 Atherosclerotic heart disease of native coronary artery without angina pectoris: Secondary | ICD-10-CM | POA: Diagnosis present

## 2019-06-19 DIAGNOSIS — I714 Abdominal aortic aneurysm, without rupture, unspecified: Secondary | ICD-10-CM | POA: Diagnosis present

## 2019-06-19 DIAGNOSIS — J449 Chronic obstructive pulmonary disease, unspecified: Secondary | ICD-10-CM | POA: Diagnosis present

## 2019-06-19 DIAGNOSIS — R319 Hematuria, unspecified: Secondary | ICD-10-CM

## 2019-06-19 DIAGNOSIS — N1832 Chronic kidney disease, stage 3b: Secondary | ICD-10-CM | POA: Diagnosis present

## 2019-06-19 DIAGNOSIS — Z7951 Long term (current) use of inhaled steroids: Secondary | ICD-10-CM

## 2019-06-19 DIAGNOSIS — K219 Gastro-esophageal reflux disease without esophagitis: Secondary | ICD-10-CM | POA: Diagnosis present

## 2019-06-19 DIAGNOSIS — A4150 Gram-negative sepsis, unspecified: Secondary | ICD-10-CM

## 2019-06-19 DIAGNOSIS — E785 Hyperlipidemia, unspecified: Secondary | ICD-10-CM | POA: Diagnosis present

## 2019-06-19 DIAGNOSIS — Z8673 Personal history of transient ischemic attack (TIA), and cerebral infarction without residual deficits: Secondary | ICD-10-CM | POA: Diagnosis not present

## 2019-06-19 DIAGNOSIS — Z87891 Personal history of nicotine dependence: Secondary | ICD-10-CM | POA: Diagnosis not present

## 2019-06-19 DIAGNOSIS — U071 COVID-19: Secondary | ICD-10-CM | POA: Diagnosis not present

## 2019-06-19 DIAGNOSIS — G9341 Metabolic encephalopathy: Secondary | ICD-10-CM | POA: Diagnosis not present

## 2019-06-19 DIAGNOSIS — Z20822 Contact with and (suspected) exposure to covid-19: Secondary | ICD-10-CM | POA: Diagnosis not present

## 2019-06-19 DIAGNOSIS — Z9981 Dependence on supplemental oxygen: Secondary | ICD-10-CM | POA: Diagnosis not present

## 2019-06-19 DIAGNOSIS — Z7901 Long term (current) use of anticoagulants: Secondary | ICD-10-CM

## 2019-06-19 DIAGNOSIS — I639 Cerebral infarction, unspecified: Secondary | ICD-10-CM

## 2019-06-19 DIAGNOSIS — I5032 Chronic diastolic (congestive) heart failure: Secondary | ICD-10-CM | POA: Diagnosis not present

## 2019-06-19 DIAGNOSIS — Z8616 Personal history of COVID-19: Secondary | ICD-10-CM | POA: Diagnosis not present

## 2019-06-19 DIAGNOSIS — E559 Vitamin D deficiency, unspecified: Secondary | ICD-10-CM | POA: Diagnosis present

## 2019-06-19 DIAGNOSIS — Z79899 Other long term (current) drug therapy: Secondary | ICD-10-CM

## 2019-06-19 DIAGNOSIS — D509 Iron deficiency anemia, unspecified: Secondary | ICD-10-CM | POA: Diagnosis present

## 2019-06-19 DIAGNOSIS — Z7401 Bed confinement status: Secondary | ICD-10-CM | POA: Diagnosis not present

## 2019-06-19 DIAGNOSIS — E872 Acidosis: Secondary | ICD-10-CM | POA: Diagnosis not present

## 2019-06-19 DIAGNOSIS — Z6841 Body Mass Index (BMI) 40.0 and over, adult: Secondary | ICD-10-CM | POA: Diagnosis not present

## 2019-06-19 DIAGNOSIS — N390 Urinary tract infection, site not specified: Secondary | ICD-10-CM | POA: Diagnosis not present

## 2019-06-19 DIAGNOSIS — Z96651 Presence of right artificial knee joint: Secondary | ICD-10-CM | POA: Diagnosis present

## 2019-06-19 DIAGNOSIS — I130 Hypertensive heart and chronic kidney disease with heart failure and stage 1 through stage 4 chronic kidney disease, or unspecified chronic kidney disease: Secondary | ICD-10-CM | POA: Diagnosis present

## 2019-06-19 DIAGNOSIS — E1122 Type 2 diabetes mellitus with diabetic chronic kidney disease: Secondary | ICD-10-CM | POA: Diagnosis present

## 2019-06-19 DIAGNOSIS — E1159 Type 2 diabetes mellitus with other circulatory complications: Secondary | ICD-10-CM | POA: Diagnosis present

## 2019-06-19 DIAGNOSIS — R404 Transient alteration of awareness: Secondary | ICD-10-CM | POA: Diagnosis not present

## 2019-06-19 DIAGNOSIS — J90 Pleural effusion, not elsewhere classified: Secondary | ICD-10-CM | POA: Diagnosis not present

## 2019-06-19 DIAGNOSIS — N200 Calculus of kidney: Secondary | ICD-10-CM | POA: Diagnosis not present

## 2019-06-19 DIAGNOSIS — E1129 Type 2 diabetes mellitus with other diabetic kidney complication: Secondary | ICD-10-CM

## 2019-06-19 DIAGNOSIS — G40909 Epilepsy, unspecified, not intractable, without status epilepticus: Secondary | ICD-10-CM | POA: Diagnosis not present

## 2019-06-19 DIAGNOSIS — E1169 Type 2 diabetes mellitus with other specified complication: Secondary | ICD-10-CM | POA: Diagnosis present

## 2019-06-19 DIAGNOSIS — Z7984 Long term (current) use of oral hypoglycemic drugs: Secondary | ICD-10-CM

## 2019-06-19 DIAGNOSIS — N179 Acute kidney failure, unspecified: Secondary | ICD-10-CM | POA: Diagnosis not present

## 2019-06-19 DIAGNOSIS — J431 Panlobular emphysema: Secondary | ICD-10-CM | POA: Diagnosis not present

## 2019-06-19 DIAGNOSIS — I10 Essential (primary) hypertension: Secondary | ICD-10-CM | POA: Diagnosis not present

## 2019-06-19 DIAGNOSIS — N3000 Acute cystitis without hematuria: Secondary | ICD-10-CM | POA: Diagnosis not present

## 2019-06-19 DIAGNOSIS — R5381 Other malaise: Secondary | ICD-10-CM | POA: Diagnosis not present

## 2019-06-19 DIAGNOSIS — E869 Volume depletion, unspecified: Secondary | ICD-10-CM | POA: Diagnosis present

## 2019-06-19 HISTORY — DX: Gram-negative sepsis, unspecified: N39.0

## 2019-06-19 HISTORY — DX: Cerebral infarction, unspecified: I63.9

## 2019-06-19 HISTORY — DX: Gram-negative sepsis, unspecified: A41.50

## 2019-06-19 LAB — BRAIN NATRIURETIC PEPTIDE: B Natriuretic Peptide: 117 pg/mL — ABNORMAL HIGH (ref 0.0–100.0)

## 2019-06-19 LAB — RESPIRATORY PANEL BY RT PCR (FLU A&B, COVID)
Influenza A by PCR: NEGATIVE
Influenza B by PCR: NEGATIVE
SARS Coronavirus 2 by RT PCR: NEGATIVE

## 2019-06-19 LAB — CBC WITH DIFFERENTIAL/PLATELET
Abs Immature Granulocytes: 0.07 10*3/uL (ref 0.00–0.07)
Basophils Absolute: 0 10*3/uL (ref 0.0–0.1)
Basophils Relative: 0 %
Eosinophils Absolute: 0 10*3/uL (ref 0.0–0.5)
Eosinophils Relative: 0 %
HCT: 35.3 % — ABNORMAL LOW (ref 36.0–46.0)
Hemoglobin: 11.3 g/dL — ABNORMAL LOW (ref 12.0–15.0)
Immature Granulocytes: 1 %
Lymphocytes Relative: 4 %
Lymphs Abs: 0.5 10*3/uL — ABNORMAL LOW (ref 0.7–4.0)
MCH: 29.7 pg (ref 26.0–34.0)
MCHC: 32 g/dL (ref 30.0–36.0)
MCV: 92.7 fL (ref 80.0–100.0)
Monocytes Absolute: 0.6 10*3/uL (ref 0.1–1.0)
Monocytes Relative: 4 %
Neutro Abs: 12.4 10*3/uL — ABNORMAL HIGH (ref 1.7–7.7)
Neutrophils Relative %: 91 %
Platelets: 326 10*3/uL (ref 150–400)
RBC: 3.81 MIL/uL — ABNORMAL LOW (ref 3.87–5.11)
RDW: 16.1 % — ABNORMAL HIGH (ref 11.5–15.5)
WBC: 13.6 10*3/uL — ABNORMAL HIGH (ref 4.0–10.5)
nRBC: 0 % (ref 0.0–0.2)

## 2019-06-19 LAB — URINALYSIS, COMPLETE (UACMP) WITH MICROSCOPIC
Bacteria, UA: NONE SEEN
Bilirubin Urine: NEGATIVE
Glucose, UA: NEGATIVE mg/dL
Hgb urine dipstick: NEGATIVE
Ketones, ur: NEGATIVE mg/dL
Nitrite: NEGATIVE
Protein, ur: 30 mg/dL — AB
RBC / HPF: >50 RBC/hpf — ABNORMAL HIGH (ref 0–5)
Specific Gravity, Urine: 1.016 (ref 1.005–1.030)
WBC, UA: >50 WBC/hpf — ABNORMAL HIGH (ref 0–5)
pH: 5 (ref 5.0–8.0)

## 2019-06-19 LAB — COMPREHENSIVE METABOLIC PANEL
ALT: 17 U/L (ref 0–44)
AST: 38 U/L (ref 15–41)
Albumin: 2.4 g/dL — ABNORMAL LOW (ref 3.5–5.0)
Alkaline Phosphatase: 177 U/L — ABNORMAL HIGH (ref 38–126)
Anion gap: 7 (ref 5–15)
BUN: 21 mg/dL (ref 8–23)
CO2: 20 mmol/L — ABNORMAL LOW (ref 22–32)
Calcium: 9.6 mg/dL (ref 8.9–10.3)
Chloride: 105 mmol/L (ref 98–111)
Creatinine, Ser: 1.47 mg/dL — ABNORMAL HIGH (ref 0.44–1.00)
GFR calc Af Amer: 39 mL/min — ABNORMAL LOW (ref >60)
GFR calc non Af Amer: 34 mL/min — ABNORMAL LOW (ref >60)
Glucose, Bld: 121 mg/dL — ABNORMAL HIGH (ref 70–99)
Potassium: 4.1 mmol/L (ref 3.5–5.1)
Sodium: 132 mmol/L — ABNORMAL LOW (ref 135–145)
Total Bilirubin: 0.6 mg/dL (ref 0.3–1.2)
Total Protein: 7.2 g/dL (ref 6.5–8.1)

## 2019-06-19 LAB — LACTIC ACID, PLASMA
Lactic Acid, Venous: 2.8 mmol/L (ref 0.5–1.9)
Lactic Acid, Venous: 2.2 mmol/L (ref 0.5–1.9)
Lactic Acid, Venous: 1.7 mmol/L (ref 0.5–1.9)
Lactic Acid, Venous: 1.4 mmol/L (ref 0.5–1.9)

## 2019-06-19 LAB — PROTIME-INR
INR: 1.4 — ABNORMAL HIGH (ref 0.8–1.2)
Prothrombin Time: 16.2 seconds — ABNORMAL HIGH (ref 11.4–15.2)

## 2019-06-19 LAB — URINE CULTURE: Culture: 50000 — AB

## 2019-06-19 LAB — LIPASE, BLOOD: Lipase: 34 U/L (ref 11–51)

## 2019-06-19 LAB — MRSA PCR SCREENING: MRSA by PCR: POSITIVE — AB

## 2019-06-19 LAB — GLUCOSE, CAPILLARY
Glucose-Capillary: 98 mg/dL (ref 70–99)
Glucose-Capillary: 74 mg/dL (ref 70–99)

## 2019-06-19 LAB — PROCALCITONIN: Procalcitonin: 0.85 ng/mL

## 2019-06-19 MED ORDER — SODIUM CHLORIDE 0.9 % IV SOLN: INTRAVENOUS | Status: DC

## 2019-06-19 MED ORDER — SODIUM CHLORIDE 0.9 % IV SOLN
2.0000 g | Freq: Once | INTRAVENOUS | Status: AC
  Administered 2019-06-19: 2 g via INTRAVENOUS
  Filled 2019-06-19: qty 2

## 2019-06-19 MED ORDER — ONDANSETRON HCL 4 MG/2ML IJ SOLN: 4.0000 mg | Freq: Three times a day (TID) | INTRAMUSCULAR | Status: DC | PRN

## 2019-06-19 MED ORDER — KETOROLAC TROMETHAMINE 30 MG/ML IJ SOLN: 15.0000 mg | Freq: Once | INTRAMUSCULAR | Status: AC

## 2019-06-19 MED ORDER — LORATADINE 10 MG PO TABS
10.0000 mg | ORAL_TABLET | Freq: Every day | ORAL | Status: DC
  Administered 2019-06-19 – 2019-06-22 (×4): 10 mg via ORAL
  Filled 2019-06-19 (×4): qty 1

## 2019-06-19 MED ORDER — FLUCONAZOLE 100MG IVPB
100.0000 mg | INTRAVENOUS | Status: DC
  Administered 2019-06-19 – 2019-06-20 (×2): 100 mg via INTRAVENOUS
  Filled 2019-06-19 (×3): qty 50

## 2019-06-19 MED ORDER — LORAZEPAM 2 MG/ML IJ SOLN: 1.0000 mg | INTRAMUSCULAR | Status: DC | PRN

## 2019-06-19 MED ORDER — NITROGLYCERIN 0.4 MG SL SUBL: 0.4000 mg | SUBLINGUAL_TABLET | SUBLINGUAL | Status: DC | PRN

## 2019-06-19 MED ORDER — PANTOPRAZOLE SODIUM 40 MG PO TBEC
40.0000 mg | DELAYED_RELEASE_TABLET | Freq: Every day | ORAL | Status: DC
  Administered 2019-06-19 – 2019-06-22 (×4): 40 mg via ORAL
  Filled 2019-06-19 (×4): qty 1

## 2019-06-19 MED ORDER — FLUTICASONE FUROATE-VILANTEROL 100-25 MCG/INH IN AEPB
1.0000 | INHALATION_SPRAY | Freq: Every day | RESPIRATORY_TRACT | Status: DC
  Administered 2019-06-19 – 2019-06-22 (×4): 1 via RESPIRATORY_TRACT
  Filled 2019-06-19: qty 28

## 2019-06-19 MED ORDER — GABAPENTIN 300 MG PO CAPS
300.0000 mg | ORAL_CAPSULE | Freq: Every day | ORAL | Status: DC
  Administered 2019-06-19 – 2019-06-21 (×3): 300 mg via ORAL
  Filled 2019-06-19 (×3): qty 1

## 2019-06-19 MED ORDER — LEVETIRACETAM 500 MG PO TABS
1500.0000 mg | ORAL_TABLET | Freq: Every day | ORAL | Status: DC
  Administered 2019-06-19 – 2019-06-22 (×4): 1500 mg via ORAL
  Filled 2019-06-19 (×4): qty 3

## 2019-06-19 MED ORDER — ASCORBIC ACID 500 MG PO TABS
500.0000 mg | ORAL_TABLET | Freq: Every day | ORAL | Status: DC
  Administered 2019-06-19 – 2019-06-22 (×4): 500 mg via ORAL
  Filled 2019-06-19 (×4): qty 1

## 2019-06-19 MED ORDER — FLUTICASONE-UMECLIDIN-VILANT 100-62.5-25 MCG/INH IN AEPB: 1.0000 | INHALATION_SPRAY | Freq: Every day | RESPIRATORY_TRACT | Status: DC

## 2019-06-19 MED ORDER — UMECLIDINIUM BROMIDE 62.5 MCG/INH IN AEPB
1.0000 | INHALATION_SPRAY | Freq: Every day | RESPIRATORY_TRACT | Status: DC
  Administered 2019-06-19 – 2019-06-22 (×4): 1 via RESPIRATORY_TRACT
  Filled 2019-06-19: qty 7

## 2019-06-19 MED ORDER — LACOSAMIDE 50 MG PO TABS
100.0000 mg | ORAL_TABLET | Freq: Two times a day (BID) | ORAL | Status: DC
  Administered 2019-06-19 – 2019-06-22 (×7): 100 mg via ORAL
  Filled 2019-06-19 (×7): qty 2

## 2019-06-19 MED ORDER — SODIUM CHLORIDE 0.9 % IV SOLN
1.0000 g | Freq: Once | INTRAVENOUS | Status: AC
  Administered 2019-06-19: 1 g via INTRAVENOUS
  Filled 2019-06-19: qty 1

## 2019-06-19 MED ORDER — ZINC SULFATE 220 (50 ZN) MG PO CAPS
220.0000 mg | ORAL_CAPSULE | Freq: Every day | ORAL | Status: DC
  Administered 2019-06-19 – 2019-06-22 (×4): 220 mg via ORAL
  Filled 2019-06-19 (×4): qty 1

## 2019-06-19 MED ORDER — IPRATROPIUM-ALBUTEROL 0.5-2.5 (3) MG/3ML IN SOLN
3.0000 mL | Freq: Three times a day (TID) | RESPIRATORY_TRACT | Status: DC
  Administered 2019-06-19 – 2019-06-22 (×9): 3 mL via RESPIRATORY_TRACT
  Filled 2019-06-19 (×11): qty 3

## 2019-06-19 MED ORDER — SACCHAROMYCES BOULARDII 250 MG PO CAPS
250.0000 mg | ORAL_CAPSULE | Freq: Two times a day (BID) | ORAL | Status: DC
  Administered 2019-06-19 – 2019-06-22 (×6): 250 mg via ORAL
  Filled 2019-06-19 (×10): qty 1

## 2019-06-19 MED ORDER — MECLIZINE HCL 25 MG PO TABS
25.0000 mg | ORAL_TABLET | Freq: Two times a day (BID) | ORAL | Status: DC | PRN
  Filled 2019-06-19: qty 1

## 2019-06-19 MED ORDER — DOCUSATE SODIUM 100 MG PO CAPS: 100.0000 mg | ORAL_CAPSULE | Freq: Every day | ORAL | Status: DC | PRN

## 2019-06-19 MED ORDER — FERROUS SULFATE 325 (65 FE) MG PO TABS
325.0000 mg | ORAL_TABLET | Freq: Two times a day (BID) | ORAL | Status: DC
  Administered 2019-06-19 – 2019-06-22 (×6): 325 mg via ORAL
  Filled 2019-06-19 (×8): qty 1

## 2019-06-19 MED ORDER — ACETAMINOPHEN 325 MG PO TABS
650.0000 mg | ORAL_TABLET | Freq: Four times a day (QID) | ORAL | Status: DC | PRN
  Administered 2019-06-20 – 2019-06-22 (×4): 650 mg via ORAL
  Filled 2019-06-19 (×4): qty 2

## 2019-06-19 MED ORDER — INSULIN ASPART 100 UNIT/ML ~~LOC~~ SOLN: 0.0000 [IU] | Freq: Three times a day (TID) | SUBCUTANEOUS | Status: DC

## 2019-06-19 MED ORDER — ATORVASTATIN CALCIUM 80 MG PO TABS
80.0000 mg | ORAL_TABLET | Freq: Every evening | ORAL | Status: DC
  Administered 2019-06-19 – 2019-06-21 (×3): 80 mg via ORAL
  Filled 2019-06-19 (×3): qty 1

## 2019-06-19 MED ORDER — LACTATED RINGERS IV BOLUS (SEPSIS)
1400.0000 mL | Freq: Once | INTRAVENOUS | Status: AC
  Administered 2019-06-19: 1000 mL via INTRAVENOUS

## 2019-06-19 MED ORDER — SODIUM CHLORIDE 0.9 % IV SOLN
1.0000 g | Freq: Two times a day (BID) | INTRAVENOUS | Status: DC
  Administered 2019-06-19 – 2019-06-20 (×2): 1 g via INTRAVENOUS
  Filled 2019-06-19 (×3): qty 1

## 2019-06-19 MED ORDER — INSULIN ASPART 100 UNIT/ML ~~LOC~~ SOLN: 0.0000 [IU] | Freq: Every day | SUBCUTANEOUS | Status: DC

## 2019-06-19 MED ORDER — LACTATED RINGERS IV BOLUS (SEPSIS)
600.0000 mL | Freq: Once | INTRAVENOUS | Status: AC
  Administered 2019-06-19: 600 mL via INTRAVENOUS

## 2019-06-19 MED ORDER — APIXABAN 5 MG PO TABS
5.0000 mg | ORAL_TABLET | Freq: Two times a day (BID) | ORAL | Status: DC
  Administered 2019-06-19 – 2019-06-20 (×3): 5 mg via ORAL
  Filled 2019-06-19 (×4): qty 1

## 2019-06-19 MED ORDER — LACTATED RINGERS IV BOLUS (SEPSIS)
1000.0000 mL | Freq: Once | INTRAVENOUS | Status: AC
  Administered 2019-06-19: 1000 mL via INTRAVENOUS

## 2019-06-19 MED ORDER — KETOROLAC TROMETHAMINE 30 MG/ML IJ SOLN
INTRAMUSCULAR | Status: AC
  Administered 2019-06-19: 15 mg via INTRAVENOUS
  Filled 2019-06-19: qty 1

## 2019-06-19 MED ORDER — VITAMIN D 25 MCG (1000 UNIT) PO TABS
1000.0000 [IU] | ORAL_TABLET | Freq: Every day | ORAL | Status: DC
  Administered 2019-06-19 – 2019-06-22 (×4): 1000 [IU] via ORAL
  Filled 2019-06-19 (×4): qty 1

## 2019-06-19 MED ORDER — ALBUTEROL SULFATE (2.5 MG/3ML) 0.083% IN NEBU: 3.0000 mL | INHALATION_SOLUTION | RESPIRATORY_TRACT | Status: DC | PRN

## 2019-06-19 NOTE — Progress Notes (Signed)
Patient admitted to unit from ED via stretcher. Bed in lowest position. Fall safety plan reviewed. Full assessment to Epic. Skin assessment verified with Thomas Hoff. Telemetry box verification with tele clerk- Box#: 22. Pt from [place], alert to self only, unable to do admission profile.

## 2019-06-19 NOTE — ED Provider Notes (Addendum)
[facility] Emergency Department Provider Note  ____________________________________________   First MD Initiated Contact with Patient 06/19/19 0120     (approximate)  I have reviewed the triage vital signs and the nursing notes.   HISTORY  Chief Complaint Nausea and Fever  Level 5 caveat:  history/ROS limited by acute/critical illness and/or chronic dementia  HPI Katherine [name] is a 79 y.o. female with extensive medical history as listed below who comes from a skilled nursing facility by EMS for evaluation of  fever and decreased level of consciousness.  According to EMS report she is nonverbal and responds only to pain.  She is a full code.  No additional history was given except that she had a fever earlier in the day and was given Tylenol 3 hours before they called EMS.  She is on 3 L of oxygen at all times for chronic respiratory failure.          Past Medical History:  Diagnosis Date  . Angioedema   . Anxiety and depression   . CHF (congestive heart failure) ([place])   . Chronic constipation   . Chronic kidney disease    stage 3  . COPD (chronic obstructive pulmonary disease) ([place])   . Diabetes mellitus without complication ([place])   . Gallstones   . GERD (gastroesophageal reflux disease)   . Hypertension   . Left ventricular hypertrophy   . Osteoarthritis   . Prurigo nodularis   . Seizures ([place])   . Stroke ([place])   . Tobacco abuse   . Vitamin D deficiency disease     Patient Active Problem List   Diagnosis Date Noted  . Iron deficiency anemia   . Altered mental status   . Acute encephalopathy 02/11/2019  . Sepsis secondary to UTI ([place]) 01/08/2019  . Acute renal failure superimposed on stage 3b chronic kidney disease ([place]) 01/08/2019  . Seizure disorder ([place]) 01/08/2019  . Diabetes mellitus type 2, controlled, with complications ([place]) 25/06/3974  . Acute on chronic anemia 01/03/2019  . COVID-19 12/21/2018  . COVID-19 virus infection  12/21/2018  . Hemoptysis 12/20/2018  . Pulmonary embolism ([place]) 11/21/2018  . Delirium   . UTI (urinary tract infection) 08/21/2018  . Chest pain 07/15/2018  . Palliative care encounter 05/10/2018  . Localized edema 05/10/2018  . Shortness of breath 05/10/2018  . Hematemesis 04/28/2018  . Influenza A 04/13/2018  . Acute on chronic congestive heart failure ([place])   . COPD with acute exacerbation ([place]) 02/24/2018  . Chronic diastolic heart failure ([place]) 12/31/2017  . Lymphedema 12/31/2017  . COPD exacerbation ([employer]) 11/30/2017  . Urinary tract infection 11/22/2017  . 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[place]) 11/16/2017  . Diabetes mellitus type 2, uncomplicated ([place]) 73/41/9379  . Pressure injury of skin 10/29/2017  . Acute kidney injury superimposed on CKD ([place]) 10/24/2017  . Sepsis ([place]) 10/24/2017  . Possible Seizures ([place]) 10/08/2017  . Hyperlipemia 10/08/2017  . 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 Stroke-like episode ([place]) s/p IV tpa 10/04/2017  . Palliative care by specialist   . Elevated rheumatoid factor 09/05/2017  . Frequent hospital admissions 08/22/2017  . Aphasia 06/28/2017  . Right hand pain 03/21/2017  . Dry skin 03/21/2017  . Pain in finger of left hand 09/12/2016  . Chronic fatigue 06/12/2016  . Left knee pain 06/12/2016  . Goals of care, counseling/discussion 03/13/2016  . Primary localized osteoarthritis of right knee 02/08/2016  . OSA (obstructive sleep apnea) 09/16/2015  . Rotator cuff  syndrome 09/07/2015  . 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 Sleep disturbance 04/14/2015  . Coronary artery disease 03/14/2015  . 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 Acute respiratory failure with hypoxia ([place]) 09/16/2014  . 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 Anxiety   . Tobacco abuse   . Prurigo nodularis   . GERD (gastroesophageal reflux disease)   . COPD (chronic obstructive pulmonary disease) ([place])   . Osteoarthritis   . Vitamin D deficiency  disease   . Chronic constipation   . CKD (chronic kidney disease), stage III   . Essential hypertension 05/20/2013  . Morbid obesity ([place]) 05/20/2013    Past Surgical History:  Procedure Laterality Date  . CHOLECYSTECTOMY    . 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   Procedure: TOTAL KNEE ARTHROPLASTY;  Surgeon: Hessie Knows, MD;  Location: [facility] ORS;  Service: Orthopedics;  Laterality: Right;    Prior to Admission medications   Medication Sig Start Date End Date Taking? Authorizing Provider  acetaminophen (TYLENOL) 650 MG CR tablet Take 650 mg by mouth 3 (three) times daily.    Yes [provider]  albuterol (ACCUNEB) 0.63 MG/3ML nebulizer solution Take 3 mLs by nebulization every 8 (eight) hours as needed for wheezing.    Yes [provider]  apixaban (ELIQUIS) 5 MG TABS tablet Take 1 tablet (5 mg total) by mouth 2 (two) times daily. To initiate this on 11/29/2018 after completing the 10 mg twice daily treatment regimen Patient taking differently: Take 5 mg by mouth 2 (two) times daily.  11/29/18  Yes Ojie, Jude, MD  atorvastatin (LIPITOR) 80 MG tablet Take 80 mg by mouth every evening.    Yes [provider]  cephALEXin (KEFLEX) 500 MG capsule Take 1 capsule (500 mg total) by mouth 2 (two) times daily. 06/08/19  Yes Lavonia Drafts, MD  cetirizine (ZYRTEC) 10 MG tablet Take 10 mg by mouth daily.    Yes [provider]  cholecalciferol (VITAMIN D) 1000 units tablet Take 1,000 Units by mouth daily.   Yes [provider]  docusate sodium (COLACE) 100 MG capsule Take 100 mg by mouth 2 (two) times daily.   Yes [provider]  ferrous sulfate 325 (65 FE) MG tablet Take 325 mg by mouth 2 (two) times daily.   Yes [provider]  Fluticasone-Umeclidin-Vilant 100-62.5-25 MCG/INH AEPB Inhale 1 puff into the lungs daily.   Yes [provider]  gabapentin (NEURONTIN) 300 MG capsule Take  300 mg by mouth at bedtime.   Yes [provider]  ipratropium-albuterol (DUONEB) 0.5-2.5 (3) MG/3ML SOLN Take 3 mLs by nebulization 3 (three) times daily. 07/17/18  Yes Gladstone Lighter, MD  lacosamide 100 MG TABS Take 1 tablet (100 mg total) by mouth 2 (two) times daily. 12/31/18  Yes Thurnell Lose, MD  levETIRAcetam (KEPPRA) 750 MG tablet Take 1,500 mg by mouth daily.   Yes [provider]  linagliptin (TRADJENTA) 5 MG TABS tablet Take 5 mg by mouth daily.   Yes [provider]  meclizine (ANTIVERT) 25 MG tablet Take 25 mg by mouth every 12 (twelve) hours as needed for dizziness.    Yes [provider]  Menthol, Topical Analgesic, (BIOFREEZE) 4 % GEL Apply 1 application topically in the morning and at bedtime. To both knees    Yes [provider]  mupirocin ointment (BACTROBAN) 2 % Place 1 application into the nose 2 (two) times daily. 06/13/18  Yes  Loletha Grayer, MD  nitroGLYCERIN (NITROSTAT) 0.4 MG SL tablet Place 0.4 mg under the tongue every 5 (five) minutes as needed for chest pain.  08/07/18  Yes [provider]  pantoprazole (PROTONIX) 40 MG tablet Take 1 tablet (40 mg total) by mouth daily. 04/29/18  Yes Bettey Costa, MD  Saccharomyces boulardii (PROBIOTIC) 250 MG CAPS Take 1 capsule by mouth 2 times daily at 12 noon and 4 pm. Patient taking differently: Take 1 capsule by mouth 2 (two) times daily.  04/05/19  Yes Merlyn Lot, MD  Saline GEL Place 2 sprays into the nose 3 (three) times daily.    Yes [provider]  vitamin C (VITAMIN C) 500 MG tablet Take 1 tablet (500 mg total) by mouth daily. 01/12/19  Yes Allie Bossier, MD  zinc sulfate 220 (50 Zn) MG capsule Take 1 capsule (220 mg total) by mouth daily. 01/12/19  Yes Allie Bossier, MD    Allergies Bee venom and Enalapril maleate  Family History  Problem Relation Age of Onset  . Alcohol abuse Mother   . Sickle cell anemia Daughter   . Hypertension Son    . Cancer Neg Hx   . COPD Neg Hx   . Diabetes Neg Hx   . Heart disease Neg Hx   . Stroke Neg Hx     Social History Social History   Tobacco Use  . Smoking status: Former Smoker    Packs/day: 0.50    Years: 60.00    Pack years: 30.00    Types: Cigarettes  . Smokeless tobacco: Never Used  . Tobacco comment: quite 64mo ago-03/26/18  Substance Use Topics  . Alcohol use: No    Alcohol/week: 0.0 standard drinks    Comment: rare  . Drug use: No    Review of Systems Level 5 caveat:  history/ROS limited by acute/critical illness and/or chronic dementia ____________________________________________   PHYSICAL EXAM:  VITAL SIGNS: ED Triage Vitals  Enc Vitals Group     BP 06/19/19 0115 100/81     Pulse Rate 06/19/19 0115 (!) 142     Resp 06/19/19 0115 (!) 25     Temp 06/19/19 0115 (!) 101.2 F (38.4 C)     Temp Source 06/19/19 0115 Oral     SpO2 06/19/19 0115 98 %     Weight 06/19/19 0113 91.4 kg (201 lb 8 oz)     Height --      Head Circumference --      Peak Flow --      Pain Score --      Pain Loc --      Pain Edu? --      Excl. in [place]? --     Constitutional: Alert and in mild distress due to discomfort.  At the time that I saw her she was answering simple questions but was confused which may be her baseline. Eyes: Muddy sclera. Head: Atraumatic. Nose: No congestion/rhinnorhea. Mouth/Throat: Patient is wearing a mask. Neck: No stridor.  No meningeal signs.   Cardiovascular: Moderate tachycardia, regular rhythm. Good peripheral circulation. Grossly normal heart sounds. Respiratory: Normal respiratory effort.  No retractions. Gastrointestinal: Soft with diffuse tenderness to palpation throughout the abdomen with no specific focal tenderness. Musculoskeletal: No lower extremity tenderness nor edema. No gross deformities of extremities. Neurologic: Slightly slurred speech and language with a documented medical history of aphasia.  Chronic contractures most notably of the  right hand and arm Skin:  Skin is warm, dry and intact.  ____________________________________________   LABS (all labs ordered are listed, but only abnormal results are displayed)  Labs Reviewed  LACTIC ACID, PLASMA - Abnormal; Notable for the following components:      Result Value   Lactic Acid, Venous 2.8 (*)    All other components within normal limits  COMPREHENSIVE METABOLIC PANEL - Abnormal; Notable for the following components:   Sodium 132 (*)    CO2 20 (*)    Glucose, Bld 121 (*)    Creatinine, Ser 1.47 (*)    Albumin 2.4 (*)    Alkaline Phosphatase 177 (*)    GFR calc non Af Amer 34 (*)    GFR calc Af Amer 39 (*)    All other components within normal limits  BRAIN NATRIURETIC PEPTIDE - Abnormal; Notable for the following components:   B Natriuretic Peptide 117.0 (*)    All other components within normal limits  CBC WITH DIFFERENTIAL/PLATELET - Abnormal; Notable for the following components:   WBC 13.6 (*)    RBC 3.81 (*)    Hemoglobin 11.3 (*)    HCT 35.3 (*)    RDW 16.1 (*)    Neutro Abs 12.4 (*)    Lymphs Abs 0.5 (*)    All other components within normal limits  PROTIME-INR - Abnormal; Notable for the following components:   Prothrombin Time 16.2 (*)    INR 1.4 (*)    All other components within normal limits  URINALYSIS, COMPLETE (UACMP) WITH MICROSCOPIC - Abnormal; Notable for the following components:   Color, Urine YELLOW (*)    APPearance TURBID (*)    Protein, ur 30 (*)    Leukocytes,Ua LARGE (*)    RBC / HPF >50 (*)    WBC, UA >50 (*)    All other components within normal limits  RESPIRATORY PANEL BY RT PCR (FLU A&B, COVID)  CULTURE, BLOOD (ROUTINE X 2)  CULTURE, BLOOD (ROUTINE X 2)  URINE CULTURE  LIPASE, BLOOD  PROCALCITONIN  LACTIC ACID, PLASMA  POC SARS CORONAVIRUS 2 AG -  ED   ____________________________________________  EKG  ED ECG REPORT I, Hinda Kehr, the attending physician, personally viewed and interpreted this ECG.   Date: 06/19/2019 EKG Time: 1:15 AM Rate: 143 Rhythm: Sinus tachycardia QRS Axis: normal Intervals: normal ST/T Wave abnormalities: Non-specific ST segment / T-wave changes, but no clear evidence of acute ischemia. Narrative Interpretation: no definitive evidence of acute ischemia; does not meet STEMI criteria.   ____________________________________________  RADIOLOGY I, Hinda Kehr, personally viewed and evaluated these images (plain radiographs) as part of my medical decision making, as well as reviewing the written report by the radiologist.  ED MD interpretation: No acute abnormalities, chronic scarring  Official radiology report(s): DG Chest Port 1 View  Result Date: 06/19/2019 CLINICAL DATA:  79 year old female with sepsis. EXAM: PORTABLE CHEST 1 VIEW COMPARISON:  Chest radiograph dated 06/08/2019. FINDINGS: Small right pleural effusion similar or slightly increased since the prior radiograph. Trace left pleural effusion may be present. There is background of emphysema. Bilateral mid to lower lung field streaky densities may represent atelectasis/scarring or infiltrate. Overall similar appearance since the prior radiograph. No new consolidation. There is no pneumothorax. Stable cardiomediastinal silhouette. Atherosclerotic calcification of the aorta. No acute osseous pathology. IMPRESSION: 1. Small right pleural effusion, similar or slightly increased since the prior radiograph. 2. Bibasilar atelectasis/scarring.  No new consolidation. Electronically Signed   By: Anner Crete M.D.   On: 06/19/2019 01:49   CT Renal Stone Study  Result Date: 06/19/2019 CLINICAL DATA:  Flank pain with kidney stone suspected EXAM: CT ABDOMEN AND PELVIS WITHOUT CONTRAST TECHNIQUE: Multidetector CT imaging of the abdomen and pelvis was performed following the standard protocol without IV contrast. COMPARISON:  11/21/2018 FINDINGS: Lower chest: Loculated pleural fluid on the right with subpleural  reticulation. The effusion is small volume but extensive where covered. Extensive aortic and coronary calcification. Hepatobiliary: No focal liver abnormality.Cholecystectomy. No bile duct dilatation. Pancreas: Unremarkable. Spleen: Unremarkable. Adrenals/Urinary Tract: Negative adrenals. No hydronephrosis or ureteral stone. Asymmetric left renal atrophy. Small cystic density at the upper pole left kidney that is stable from prior enhanced scan. Punctate right lower pole calculus. 8 mm nodule in the posterior pararenal space on the right with emanating vessel, essentially stable from prior and considered benign. Unremarkable bladder. Stomach/Bowel:  No obstruction. No evidence of bowel inflammation. Vascular/Lymphatic: No acute vascular abnormality. Extensive atherosclerosis of the aorta and iliacs. Fusiform abdominal aortic aneurysm measuring up to 4.2 Cm in diameter on orthogonal measurements by sagittal reformats. When measured in a similar fashion there is no change. No change in the degree of ventral intimal calcification distortion at this level. No adenopathy Reproductive:No pathologic findings. Other: No ascites or pneumoperitoneum. Musculoskeletal: No acute abnormalities. Disc degeneration and spondylosis. IMPRESSION: 1. No acute finding. There is a small right renal calculus that is nonobstructive. 2. Complex/loculated right pleural effusion that is small volume. This effusion has increased from 2020 imaging, without evident cause. 3.  Aortic Atherosclerosis (ICD10-I70.0).  Coronary atherosclerosis. 4. 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 No growth since CT in 2020. Recommend followup by ultrasound in 1 year. This recommendation follows ACR consensus guidelines: White Paper of the ACR Incidental Findings Committee II on Vascular Findings. J Am Coll Radiol 2013; 10:789-794. Aortic aneurysm NOS (ICD10-I71.9) Electronically Signed   By: Monte Fantasia M.D.   On: 06/19/2019 04:16     ____________________________________________   PROCEDURES   Procedure(s) performed (including Critical Care):  .Critical Care Performed by: Hinda Kehr, MD Authorized by: Hinda Kehr, MD   Critical care provider statement:    Critical care time (minutes):  45   Critical care time was exclusive of:  Separately billable procedures and treating other patients   Critical care was necessary to treat or prevent imminent or life-threatening deterioration of the following conditions:  Sepsis   Critical care was time spent personally by me on the following activities:  Development of treatment plan with patient or surrogate, discussions with consultants, evaluation of patient's response to treatment, examination of patient, obtaining history from patient or surrogate, ordering and performing treatments and interventions, ordering and review of laboratory studies, ordering and review of radiographic studies, pulse oximetry, re-evaluation of patient's condition and review of old charts .1-3 Lead EKG Interpretation Performed by: Hinda Kehr, MD Authorized by: Hinda Kehr, MD     Interpretation: abnormal     ECG rate:  123   ECG rate assessment: tachycardic     Rhythm: sinus tachycardia     Ectopy: none     Conduction: normal       ____________________________________________   INITIAL IMPRESSION / MDM / ASSESSMENT AND PLAN / ED COURSE  As part of my medical decision making, I reviewed the following data within the [place] notes reviewed and incorporated, Labs reviewed , EKG interpreted , Old chart reviewed, Radiograph reviewed , Discussed with admitting physician  and Notes from prior ED visits   Differential diagnosis includes, but is not limited to,  sepsis, urinary tract infection, pneumonia, bacteremia, acute intra-abdominal infection, obstructive uropathy, COVID-19.  The patient's medical record indicates that she had COVID-19 about 7 months ago so  it is unlikely to be a new infection.  She is having no apparent respiratory difficulties.  She easily meets sepsis criteria based on all of her vital signs at presentation including her fever and tachycardia.  Her blood pressure initially was borderline.  I have ordered 30 mL/kg of lactated Ringer's based on ideal body weight since her BMI is greater than 35.  I will likely add additional fluids subsequently if needed.  I am holding off on antibiotics only until we have a probable source and I have asked the nurses to in and out catheterize her for urine.  Chest x-ray was unremarkable with no evidence of acute infection.  Rapid COVID-19 antigen test was negative.  The patient is on the cardiac monitor to evaluate for evidence of arrhythmia and/or significant heart rate changes.     Clinical Course as of Jun 19 623  Thu Jun 19, 2019  1937 Procalcitonin: 0.85 [CF]  0325 Lactic Acid, Venous(!!): 2.8 [CF]  0347 Grossly positive urinalysis with hematuria.  I have ordered a CT renal stone protocol to rule out obstructive uropathy.  Lab work is generally reassuring with a creatinine of 1.47 but this is about her baseline.  Procalcitonin and lactic acid are both elevated consistent with acute infection.   [CF]  [phone] Discussed case by phone with Dr. Sidney Ace who will admit.   [CF]  0432 No obstructive uropathy.  Known AAA that has not increased in size.  Consulting hospitalist for admission  CT Renal Stone Study [CF]  0973 Second lactic acid pending.  Sepsis reassessment complete.  BP remaining essentially stable, last SBP was 93 but MAP remains >=65 and she is receiving another 1.4L NS bolus.   [CF]  0507 Improving lactic acid  Lactic Acid, Venous(!!): 2.2 [CF]  0623 Dr. Sidney Ace reached out by phone with concerns over the patient's decreased blood pressure and ask if I would contact the ICU for admission for pressors.  However after a recheck of the blood pressure, her most recent pressure is 97/69 with a  MAP of 78.  Her heart rate has improved significantly to 106 when it was originally in the 140s.  She is hemodynamically stable and in my opinion does not require pressors nor ICU admission.  I encouraged him to follow-up with the ICU if he felt it was appropriate but in my opinion it was not medically indicated at this time.   [CF]    Clinical Course User Index [CF] Hinda Kehr, MD     ____________________________________________  FINAL CLINICAL IMPRESSION(S) / ED DIAGNOSES  Final diagnoses:  Sepsis, due to unspecified organism, unspecified whether acute organ dysfunction present [facility])  Urinary tract infection with hematuria, site unspecified     MEDICATIONS GIVEN DURING THIS VISIT:  Medications  lactated ringers bolus 1,400 mL (1,000 mLs Intravenous New Bag/Given 06/19/19 0414)  lactated ringers bolus 1,000 mL (0 mLs Intravenous Stopped 06/19/19 0304)    And  lactated ringers bolus 600 mL (0 mLs Intravenous Stopped 06/19/19 0304)  ceFEPIme (MAXIPIME) 2 g in sodium chloride 0.9 % 100 mL IVPB (0 g Intravenous Stopped 06/19/19 0345)  ketorolac (TORADOL) 30 MG/ML injection 15 mg (15 mg Intravenous Given 06/19/19 0414)     ED Discharge Orders    None      *Please note:  Sanah Kinslow was evaluated in  Emergency Department on 06/19/2019 for the symptoms described in the history of present illness. She was evaluated in the context of the global COVID-19 pandemic, which necessitated consideration that the patient might be at risk for infection with the SARS-CoV-2 virus that causes COVID-19. Institutional protocols and algorithms that pertain to the evaluation of patients at risk for COVID-19 are in a state of rapid change based on information released by regulatory bodies including the CDC and federal and state organizations. These policies and algorithms were followed during the patient's care in the ED.  Some ED evaluations and interventions may be delayed as a result of limited staffing during  the pandemic.*  Note:  This document was prepared using Dragon voice recognition software and may include unintentional dictation errors.   Hinda Kehr, MD 06/19/19 1791    Hinda Kehr, MD 06/19/19 5056    Hinda Kehr, MD 06/19/19 [phone]

## 2019-06-19 NOTE — ED Notes (Signed)
Report given to Erica, RN

## 2019-06-19 NOTE — ED Notes (Signed)
Pt resting comfortably awaiting admission.

## 2019-06-19 NOTE — Progress Notes (Signed)
Pharmacy Antibiotic Note  Katherine [name] is a 79 y.o. female admitted on 06/19/2019 with sepsis/ UTI.-hx of ESBL per MD note.  Pharmacy has been consulted for Meropenem dosing.  -from SNF w/ fever, hx CKD, seizures, PCT 0.85, keflex ordered 06/08/19  -received Cefepime x 1 in ED  Plan: Will order Meropenem 1 gm x 1 for ED -will order Meropenem 1 gram IV q12h for Crcl 34.4 ml/min -f/u Ucx, watch Meropenem with seizures   Weight: 91.4 kg (201 lb 8 oz)  Temp (24hrs), Avg:100 F (37.8 C), Min:98.8 F (37.1 C), Max:101.2 F (38.4 C)  Recent Labs  Lab 06/19/19 0136 06/19/19 0137 06/19/19 0415  WBC  --  13.6*  --   CREATININE  --  1.47*  --   LATICACIDVEN 2.8*  --  2.2*    Estimated Creatinine Clearance: 34.4 mL/min (A) (by C-G formula based on SCr of 1.47 mg/dL (H)).    Allergies  Allergen Reactions  . Bee Venom Swelling  . Enalapril Maleate Swelling    Antimicrobials this admission: Cefepime 5/6 x 1 Meropenem  5/6 >>    Dose adjustments this admission:    Microbiology results: 5/6 BCx:  pend 5/6 UCx: pend    Sputum:      MRSA PCR:    Thank you for allowing pharmacy to be a part of this patient's care.  Katherine [name] A 06/19/2019 9:11 AM

## 2019-06-19 NOTE — ED Notes (Signed)
Pt repositioned, depends clean and dry at this time with urine noted in suction canister.

## 2019-06-19 NOTE — ED Notes (Signed)
Pt clean and dry. Denies further needs. Pt requests to rest and take medications later in day.

## 2019-06-19 NOTE — ED Notes (Signed)
Pt refusing her PO medications and neb treatment at this time.

## 2019-06-19 NOTE — ED Notes (Signed)
Pt to CT

## 2019-06-19 NOTE — ED Triage Notes (Signed)
Pt in with n.v. from [facility], did have fever. Tylenol given 3 hrs pta, pt alert to pain. Per EMS this is her normal mentation, pt on 3l per [place] all the time.

## 2019-06-19 NOTE — H&P (Addendum)
History and Physical    Katherine [name] NLZ:[id number] DOB: [birth year] DOA: 06/19/2019  Referring MD/NP/PA:   PCP: [facility]   Patient coming from:  The patient is coming from SNF.  At baseline, pt is dependent for most of ADL.        Chief Complaint: fever and AMS  HPI: Katherine [name] is a 79 y.o. female with medical history significant of hypertension, hyperlipidemia, diabetes mellitus, COPD on 3 L oxygen at home, CAD, stroke, GERD, depression, anxiety, seizure, CKD stage III, dCHF, COVID-19 infection 01/03/2019, iron deficiency anemia, who presents with fever and altered mental status.  Per her daughter and husband (I called both of them by phone), patient was noted to have fever and chills at home.  Patient is more confused than baseline.  When I saw patient in the ED, she is confused, oriented to the place, knows her own name, but not oriented to time.  She moves all extremities.  No facial droop or slurred speech.  Patient denies chest pain, cough or shortness breath.  No active nausea vomiting, diarrhea noted.  Not sure if patient has symptoms of UTI.  ED Course: pt was found to have positive urinalysis for UTI (turbid appearance, large amount of leukocyte, negative bacteria, positive yeast, WBC >50), WBC 13.6, lactic acid 2.8, BNP 117, lipase 34, negative COVID-19 PCR, slightly worsening renal function, temperature 101.2, blood pressure 93/55 and 86/67, which improved to 103/75 after giving 3 L of LR in ED. Pt is admitted to progressive bed as inpatient.  # CT-renal stone study: 1. No acute finding. There is a small right renal calculus that is nonobstructive. 2. Complex/loculated right pleural effusion that is small volume. This effusion has increased from 2020 imaging, without evident cause. 3.  Aortic Atherosclerosis (ICD10-I70.0).  Coronary atherosclerosis. 4. 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 No growth since CT in 2020.   Review of Systems:  Could not reviewed accurately due to altered mental status.   Allergy:  Allergies  Allergen Reactions  . Bee Venom Swelling  . Enalapril Maleate Swelling    Past Medical History:  Diagnosis Date  . Angioedema   . Anxiety and depression   . CHF (congestive heart failure) ([place])   . Chronic constipation   . Chronic kidney disease    stage 3  . COPD (chronic obstructive pulmonary disease) ([place])   . Diabetes mellitus without complication ([place])   . Gallstones   . GERD (gastroesophageal reflux disease)   . Hypertension   . Left ventricular hypertrophy   . Osteoarthritis   . Prurigo nodularis   . Seizures ([place])   . Stroke ([place])   . Tobacco abuse   . Vitamin D deficiency disease     Past Surgical History:  Procedure Laterality Date  . CHOLECYSTECTOMY    . 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   Procedure: TOTAL KNEE ARTHROPLASTY;  Surgeon: Hessie Knows, MD;  Location: [facility] ORS;  Service: Orthopedics;  Laterality: Right;    Social History:  reports that she has quit smoking. Her smoking use included cigarettes. She has a 30.00 pack-year smoking history. She has never used smokeless tobacco. She reports that she does not drink alcohol or use drugs.  Family History:  Family History  Problem Relation Age of Onset  . Alcohol abuse Mother   . Sickle cell anemia Daughter   . Hypertension Son   . Cancer Neg Hx   . COPD Neg  Hx   . Diabetes Neg Hx   . Heart disease Neg Hx   . Stroke Neg Hx      Prior to Admission medications   Medication Sig Start Date End Date Taking? Authorizing Provider  acetaminophen (TYLENOL) 650 MG CR tablet Take 650 mg by mouth 3 (three) times daily.    Yes [provider]  albuterol (ACCUNEB) 0.63 MG/3ML nebulizer solution Take 3 mLs by nebulization every 8 (eight) hours as needed for wheezing.    Yes [provider]  apixaban (ELIQUIS) 5 MG TABS tablet Take 1 tablet (5 mg total) by mouth 2  (two) times daily. To initiate this on 11/29/2018 after completing the 10 mg twice daily treatment regimen Patient taking differently: Take 5 mg by mouth 2 (two) times daily.  11/29/18  Yes Ojie, Jude, MD  atorvastatin (LIPITOR) 80 MG tablet Take 80 mg by mouth every evening.    Yes [provider]  cephALEXin (KEFLEX) 500 MG capsule Take 1 capsule (500 mg total) by mouth 2 (two) times daily. 06/08/19  Yes Lavonia Drafts, MD  cetirizine (ZYRTEC) 10 MG tablet Take 10 mg by mouth daily.    Yes [provider]  cholecalciferol (VITAMIN D) 1000 units tablet Take 1,000 Units by mouth daily.   Yes [provider]  docusate sodium (COLACE) 100 MG capsule Take 100 mg by mouth 2 (two) times daily.   Yes [provider]  ferrous sulfate 325 (65 FE) MG tablet Take 325 mg by mouth 2 (two) times daily.   Yes [provider]  Fluticasone-Umeclidin-Vilant 100-62.5-25 MCG/INH AEPB Inhale 1 puff into the lungs daily.   Yes [provider]  gabapentin (NEURONTIN) 300 MG capsule Take 300 mg by mouth at bedtime.   Yes [provider]  ipratropium-albuterol (DUONEB) 0.5-2.5 (3) MG/3ML SOLN Take 3 mLs by nebulization 3 (three) times daily. 07/17/18  Yes Gladstone Lighter, MD  lacosamide 100 MG TABS Take 1 tablet (100 mg total) by mouth 2 (two) times daily. 12/31/18  Yes Thurnell Lose, MD  levETIRAcetam (KEPPRA) 750 MG tablet Take 1,500 mg by mouth daily.   Yes [provider]  linagliptin (TRADJENTA) 5 MG TABS tablet Take 5 mg by mouth daily.   Yes [provider]  meclizine (ANTIVERT) 25 MG tablet Take 25 mg by mouth every 12 (twelve) hours as needed for dizziness.    Yes [provider]  Menthol, Topical Analgesic, (BIOFREEZE) 4 % GEL Apply 1 application topically in the morning and at bedtime. To both knees    Yes [provider]  mupirocin ointment (BACTROBAN) 2 % Place 1 application into the nose 2 (two) times daily.  06/13/18  Yes Wieting, Richard, MD  nitroGLYCERIN (NITROSTAT) 0.4 MG SL tablet Place 0.4 mg under the tongue every 5 (five) minutes as needed for chest pain.  08/07/18  Yes [provider]  pantoprazole (PROTONIX) 40 MG tablet Take 1 tablet (40 mg total) by mouth daily. 04/29/18  Yes Bettey Costa, MD  Saccharomyces boulardii (PROBIOTIC) 250 MG CAPS Take 1 capsule by mouth 2 times daily at 12 noon and 4 pm. Patient taking differently: Take 1 capsule by mouth 2 (two) times daily.  04/05/19  Yes Merlyn Lot, MD  Saline GEL Place 2 sprays into the nose 3 (three) times daily.    Yes [provider]  vitamin C (VITAMIN C) 500 MG tablet Take 1 tablet (500 mg total) by mouth daily. 01/12/19  Yes Allie Bossier,  MD  zinc sulfate 220 (50 Zn) MG capsule Take 1 capsule (220 mg total) by mouth daily. 01/12/19  Yes Allie Bossier, MD    Physical Exam: Vitals:   06/19/19 1200 06/19/19 1500 06/19/19 1515 06/19/19 1616  BP: 117/82 111/79  117/84  Pulse: 89  89 88  Resp: (!) 34  20 17  Temp:    98.2 F (36.8 C)  TempSrc:      SpO2: 100%  99% 98%  Weight:       General: Not in acute distress HEENT:       Eyes: PERRL, EOMI, no scleral icterus.       ENT: No discharge from the ears and nose, no pharynx injection, no tonsillar enlargement.        Neck: No JVD, no bruit, no mass felt. Heme: No neck lymph node enlargement. Cardiac: S1/S2, RRR, No murmurs, No gallops or rubs. Respiratory: No rales, wheezing, rhonchi or rubs. GI: Soft, nondistended, nontender, no organomegaly, BS present. GU: No hematuria Ext: No pitting leg edema bilaterally. 2+DP/PT pulse bilaterally. Musculoskeletal: No joint deformities, No joint redness or warmth, no limitation of ROM in spin. Skin: No rashes.  Neuro: confused, oriented to the place, knows her own name, but not oriented to time. cranial nerves II-XII grossly intact, moves all extremities Psych: Patient is not psychotic, no suicidal or hemocidal  ideation.  Labs on Admission: I have personally reviewed following labs and imaging studies  CBC: Recent Labs  Lab 06/19/19 0137  WBC 13.6*  NEUTROABS 12.4*  HGB 11.3*  HCT 35.3*  MCV 92.7  PLT 315   Basic Metabolic Panel: Recent Labs  Lab 06/19/19 0137  NA 132*  K 4.1  CL 105  CO2 20*  GLUCOSE 121*  BUN 21  CREATININE 1.47*  CALCIUM 9.6   GFR: Estimated Creatinine Clearance: 34.4 mL/min (A) (by C-G formula based on SCr of 1.47 mg/dL (H)). Liver Function Tests: Recent Labs  Lab 06/19/19 0137  AST 38  ALT 17  ALKPHOS 177*  BILITOT 0.6  PROT 7.2  ALBUMIN 2.4*   Recent Labs  Lab 06/19/19 0137  LIPASE 34   No results for input(s): AMMONIA in the last 168 hours. Coagulation Profile: Recent Labs  Lab 06/19/19 0137  INR 1.4*   Cardiac Enzymes: No results for input(s): CKTOTAL, CKMB, CKMBINDEX, TROPONINI in the last 168 hours. BNP (last 3 results) No results for input(s): PROBNP in the last 8760 hours. HbA1C: No results for input(s): HGBA1C in the last 72 hours. CBG: Recent Labs  Lab 06/19/19 1617  GLUCAP 98   Lipid Profile: No results for input(s): CHOL, HDL, LDLCALC, TRIG, CHOLHDL, LDLDIRECT in the last 72 hours. Thyroid Function Tests: No results for input(s): TSH, T4TOTAL, FREET4, T3FREE, THYROIDAB in the last 72 hours. Anemia Panel: No results for input(s): VITAMINB12, FOLATE, FERRITIN, TIBC, IRON, RETICCTPCT in the last 72 hours. Urine analysis:    Component Value Date/Time   COLORURINE YELLOW (A) 06/19/2019 0137   APPEARANCEUR TURBID (A) 06/19/2019 0137   APPEARANCEUR Clear 02/20/2013 1459   LABSPEC 1.016 06/19/2019 0137   LABSPEC 1.012 02/20/2013 1459   PHURINE 5.0 06/19/2019 0137   GLUCOSEU NEGATIVE 06/19/2019 0137   GLUCOSEU Negative 02/20/2013 1459   HGBUR NEGATIVE 06/19/2019 [place] NEGATIVE 06/19/2019 0137   BILIRUBINUR Negative 02/20/2013 [place] 06/19/2019 0137   PROTEINUR 30 (A) 06/19/2019 0137     NITRITE NEGATIVE 06/19/2019 0137   LEUKOCYTESUR LARGE (A) 06/19/2019 0137  LEUKOCYTESUR Negative 02/20/2013 1459   Sepsis Labs: @LABRCNTIP (procalcitonin:4,lacticidven:4) ) Recent Results (from the past 240 hour(s))  Respiratory Panel by RT PCR (Flu A&B, Covid) - Nasopharyngeal Swab     Status: None   Collection Time: 06/19/19  1:26 AM   Specimen: Nasopharyngeal Swab  Result Value Ref Range Status   SARS Coronavirus 2 by RT PCR NEGATIVE NEGATIVE Final    Comment: (NOTE) SARS-CoV-2 target nucleic acids are NOT DETECTED. The SARS-CoV-2 RNA is generally detectable in upper respiratoy specimens during the acute phase of infection. The lowest concentration of SARS-CoV-2 viral copies this assay can detect is 131 copies/mL. A negative result does not preclude SARS-Cov-2 infection and should not be used as the sole basis for treatment or other patient management decisions. A negative result may occur with  improper specimen collection/handling, submission of specimen other than nasopharyngeal swab, presence of viral mutation(s) within the areas targeted by this assay, and inadequate number of viral copies (<131 copies/mL). A negative result must be combined with clinical observations, patient history, and epidemiological information. The expected result is Negative. Fact Sheet for Patients:  PinkCheek.be Fact Sheet for Healthcare Providers:  GravelBags.it This test is not yet ap proved or cleared by the [place] FDA and  has been authorized for detection and/or diagnosis of SARS-CoV-2 by FDA under an Emergency Use Authorization (EUA). This EUA will remain  in effect (meaning this test can be used) for the duration of the COVID-19 declaration under Section 564(b)(1) of the Act, 21 U.S.C. section 360bbb-3(b)(1), unless the authorization is terminated or revoked sooner.    Influenza A by PCR NEGATIVE NEGATIVE Final    Influenza B by PCR NEGATIVE NEGATIVE Final    Comment: (NOTE) The Xpert Xpress SARS-CoV-2/FLU/RSV assay is intended as an aid in  the diagnosis of influenza from Nasopharyngeal swab specimens and  should not be used as a sole basis for treatment. Nasal washings and  aspirates are unacceptable for Xpert Xpress SARS-CoV-2/FLU/RSV  testing. Fact Sheet for Patients: PinkCheek.be Fact Sheet for Healthcare Providers: GravelBags.it This test is not yet approved or cleared by the [place] FDA and  has been authorized for detection and/or diagnosis of SARS-CoV-2 by  FDA under an Emergency Use Authorization (EUA). This EUA will remain  in effect (meaning this test can be used) for the duration of the  Covid-19 declaration under Section 564(b)(1) of the Act, 21  U.S.C. section 360bbb-3(b)(1), unless the authorization is  terminated or revoked. Performed at [facility], [address] [place], [place] [address]   Blood Culture (routine x 2)     Status: None (Preliminary result)   Collection Time: 06/19/19  1:37 AM   Specimen: Left Antecubital; Blood  Result Value Ref Range Status   Specimen Description LEFT ANTECUBITAL  Final   Special Requests   Final    BOTTLES DRAWN AEROBIC AND ANAEROBIC Blood Culture adequate volume   Culture   Final    NO GROWTH < 12 HOURS Performed at [facility], [address], [place], [place] [address]    Report Status PENDING  Incomplete  Blood Culture (routine x 2)     Status: None (Preliminary result)   Collection Time: 06/19/19  1:37 AM   Specimen: Right Antecubital; Blood  Result Value Ref Range Status   Specimen Description RIGHT ANTECUBITAL  Final   Special Requests   Final    BOTTLES DRAWN AEROBIC AND ANAEROBIC Blood Culture results may not be optimal due to an inadequate  volume of blood received in culture bottles   Culture   Final    NO GROWTH < 12 HOURS Performed at  [facility], [address] [place], [place] [address]    Report Status PENDING  Incomplete     Radiological Exams on Admission: DG Chest Port 1 View  Result Date: 06/19/2019 CLINICAL DATA:  79 year old female with sepsis. EXAM: PORTABLE CHEST 1 VIEW COMPARISON:  Chest radiograph dated 06/08/2019. FINDINGS: Small right pleural effusion similar or slightly increased since the prior radiograph. Trace left pleural effusion may be present. There is background of emphysema. Bilateral mid to lower lung field streaky densities may represent atelectasis/scarring or infiltrate. Overall similar appearance since the prior radiograph. No new consolidation. There is no pneumothorax. Stable cardiomediastinal silhouette. Atherosclerotic calcification of the aorta. No acute osseous pathology. IMPRESSION: 1. Small right pleural effusion, similar or slightly increased since the prior radiograph. 2. Bibasilar atelectasis/scarring.  No new consolidation. Electronically Signed   By: Anner Crete M.D.   On: 06/19/2019 01:49   CT Renal Stone Study  Result Date: 06/19/2019 CLINICAL DATA:  Flank pain with kidney stone suspected EXAM: CT ABDOMEN AND PELVIS WITHOUT CONTRAST TECHNIQUE: Multidetector CT imaging of the abdomen and pelvis was performed following the standard protocol without IV contrast. COMPARISON:  11/21/2018 FINDINGS: Lower chest: Loculated pleural fluid on the right with subpleural reticulation. The effusion is small volume but extensive where covered. Extensive aortic and coronary calcification. Hepatobiliary: No focal liver abnormality.Cholecystectomy. No bile duct dilatation. Pancreas: Unremarkable. Spleen: Unremarkable. Adrenals/Urinary Tract: Negative adrenals. No hydronephrosis or ureteral stone. Asymmetric left renal atrophy. Small cystic density at the upper pole left kidney that is stable from prior enhanced scan. Punctate right lower pole calculus. 8 mm nodule in the posterior pararenal  space on the right with emanating vessel, essentially stable from prior and considered benign. Unremarkable bladder. Stomach/Bowel:  No obstruction. No evidence of bowel inflammation. Vascular/Lymphatic: No acute vascular abnormality. Extensive atherosclerosis of the aorta and iliacs. Fusiform abdominal aortic aneurysm measuring up to 4.2 Cm in diameter on orthogonal measurements by sagittal reformats. When measured in a similar fashion there is no change. No change in the degree of ventral intimal calcification distortion at this level. No adenopathy Reproductive:No pathologic findings. Other: No ascites or pneumoperitoneum. Musculoskeletal: No acute abnormalities. Disc degeneration and spondylosis. IMPRESSION: 1. No acute finding. There is a small right renal calculus that is nonobstructive. 2. Complex/loculated right pleural effusion that is small volume. This effusion has increased from 2020 imaging, without evident cause. 3.  Aortic Atherosclerosis (ICD10-I70.0).  Coronary atherosclerosis. 4. 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 No growth since CT in 2020. Recommend followup by ultrasound in 1 year. This recommendation follows ACR consensus guidelines: White Paper of the ACR Incidental Findings Committee II on Vascular Findings. J Am Coll Radiol 2013; 10:789-794. Aortic aneurysm NOS (ICD10-I71.9) Electronically Signed   By: Monte Fantasia M.D.   On: 06/19/2019 04:16     EKG: Independently reviewed.  Sinus rhythm, tachycardia, QTC 423, LAE, poor R wave progression, LAD, nonspecific T wave change   Assessment/Plan Principal Problem:   UTI (urinary tract infection) Active Problems:   Essential hypertension   GERD (gastroesophageal reflux disease)   COPD (chronic obstructive pulmonary disease) (HCC)   Coronary artery disease   Hyperlipemia   Sepsis (HCC)   Chronic diastolic heart failure (HCC)   Sepsis secondary to UTI ([place])   Acute renal failure superimposed on stage 3b chronic  kidney disease ([place])  Seizure disorder ([place])   Iron deficiency anemia   Type II diabetes mellitus with renal manifestations (HCC)   Stroke (HCC)   AAA (abdominal aortic aneurysm) (HCC)   Acute metabolic encephalopathy   Sepsis due to UTI (urinary tract infection): Patient meets criteria for sepsis with hypotension, leukocytosis, tachycardia, tachypnea, elevated lactic acid 2.8.  Blood pressure is responding to IV fluid resuscitation.  Patient has history of ESBL.  -Admitted to progressive bed as inpatient -Meropenem (patient received 1 dose of cefepime by ED). -will add diflucan due to yeast presence in UA  --will get Procalcitonin and trend lactic acid levels per sepsis protocol. -IVF: 3L of LR bolus in ED, followed by 75 cc/h -->give more bolus of IVF as needed -f/u Bx and Ux  Acute metabolic encephalopathy: Likely multifactorial etiology including UTI, worsening renal function, sepsis -Frequent neuro check  Essential hypertension: -hold all Bp meds  GERD (gastroesophageal reflux disease): -protonix  COPD (chronic obstructive pulmonary disease) ([place]): stable -Bronchodilators  Coronary artery disease -Continue Lipitor, as needed nitroglycerin  Hyperlipemia -Lipitor  Chronic diastolic heart failure ([place]): 2D echo on 07/03/2018 showed EF of 55-60%.  Patient does not have leg edema or JVD.  CHF seem to be compensated. -Check BNP  Acute renal failure superimposed on stage 3b chronic kidney disease ([place]): Likely due to UTI -f/u by BMP  Seizure -Seizure precaution -When necessary Ativan for seizure -Continue Home medications: Keppra and lacosamide  Iron deficiency anemia: hgb 11.3 -Continue iron supplement  Type II diabetes mellitus with renal manifestations ([place]): Most recent A1c 5.2 , well controled. Patient is taking Tradjenta at home -SSI  Hx of  Stroke [facility]): -Lipitor  AAA (abdominal aortic aneurysm) ([place]): -f/u with PCP  Eliquis use: pt is on Eliquis, but  not sure why pt is on this medications. Her daughter and husband both do not know why. -Will continue Eliquis now    DVT ppx: on Eliquuis Code Status: Full code per her husband Family Communication: Yes, patient's  Daughter and husband by phone Disposition Plan:  Anticipate discharge back to previous SNF environment Consults called:  none Admission status:  progressive unit as inpt       Status is: Inpatient Remains inpatient appropriate because:Inpatient level of care appropriate due to severity of illness Dispo: The patient is from: SNF              Anticipated d/c is to: SNF              Anticipated d/c date is: 2 days              Patient currently is not medically stable to d/c.       Date of Service 06/19/2019    Ivor Costa [facility]   If 7PM-7AM, please contact night-coverage [email or web address] 06/19/2019, 5:02 PM

## 2019-06-19 NOTE — ED Notes (Signed)
Pt resting, pending CT scan.

## 2019-06-19 NOTE — Progress Notes (Signed)
CODE SEPSIS - PHARMACY COMMUNICATION  **Broad Spectrum Antibiotics should be administered within 1 hour of Sepsis diagnosis**  Time Code Sepsis Called/Page Received: 0123  Antibiotics Ordered: cefepime  Time of 1st antibiotic administration: 0310  Additional action taken by pharmacy:   If necessary, Name of Provider/Nurse Contacted:     Tobie Lords ,PharmD Clinical Pharmacist  06/19/2019  4:28 AM

## 2019-06-19 NOTE — Progress Notes (Signed)
PHARMACY -  BRIEF ANTIBIOTIC NOTE   Pharmacy has received consult(s) for cefepime from an ED provider.  The patient's profile has been reviewed for ht/wt/allergies/indication/available labs.    One time order(s) placed for cefepime 2g IV x 1   Further antibiotics/pharmacy consults should be ordered by admitting physician if indicated.                       Thank you,  Tobie Lords, PharmD, BCPS Clinical Pharmacist 06/19/2019  2:52 AM

## 2019-06-20 DIAGNOSIS — G9341 Metabolic encephalopathy: Secondary | ICD-10-CM

## 2019-06-20 DIAGNOSIS — N1832 Chronic kidney disease, stage 3b: Secondary | ICD-10-CM

## 2019-06-20 DIAGNOSIS — I10 Essential (primary) hypertension: Secondary | ICD-10-CM

## 2019-06-20 DIAGNOSIS — N390 Urinary tract infection, site not specified: Secondary | ICD-10-CM

## 2019-06-20 DIAGNOSIS — N3000 Acute cystitis without hematuria: Secondary | ICD-10-CM

## 2019-06-20 DIAGNOSIS — D509 Iron deficiency anemia, unspecified: Secondary | ICD-10-CM

## 2019-06-20 DIAGNOSIS — J431 Panlobular emphysema: Secondary | ICD-10-CM

## 2019-06-20 DIAGNOSIS — N179 Acute kidney failure, unspecified: Secondary | ICD-10-CM

## 2019-06-20 DIAGNOSIS — A419 Sepsis, unspecified organism: Principal | ICD-10-CM

## 2019-06-20 DIAGNOSIS — G40909 Epilepsy, unspecified, not intractable, without status epilepticus: Secondary | ICD-10-CM

## 2019-06-20 LAB — CBC
HCT: 28.8 % — ABNORMAL LOW (ref 36.0–46.0)
Hemoglobin: 9.3 g/dL — ABNORMAL LOW (ref 12.0–15.0)
MCH: 29.6 pg (ref 26.0–34.0)
MCHC: 32.3 g/dL (ref 30.0–36.0)
MCV: 91.7 fL (ref 80.0–100.0)
Platelets: 262 10*3/uL (ref 150–400)
RBC: 3.14 MIL/uL — ABNORMAL LOW (ref 3.87–5.11)
RDW: 16.1 % — ABNORMAL HIGH (ref 11.5–15.5)
WBC: 9.4 10*3/uL (ref 4.0–10.5)
nRBC: 0 % (ref 0.0–0.2)

## 2019-06-20 LAB — BASIC METABOLIC PANEL
Anion gap: 5 (ref 5–15)
BUN: 25 mg/dL — ABNORMAL HIGH (ref 8–23)
CO2: 23 mmol/L (ref 22–32)
Calcium: 9.2 mg/dL (ref 8.9–10.3)
Chloride: 110 mmol/L (ref 98–111)
Creatinine, Ser: 1.31 mg/dL — ABNORMAL HIGH (ref 0.44–1.00)
GFR calc Af Amer: 45 mL/min — ABNORMAL LOW (ref >60)
GFR calc non Af Amer: 39 mL/min — ABNORMAL LOW (ref >60)
Glucose, Bld: 96 mg/dL (ref 70–99)
Potassium: 4.4 mmol/L (ref 3.5–5.1)
Sodium: 138 mmol/L (ref 135–145)

## 2019-06-20 LAB — GLUCOSE, CAPILLARY
Glucose-Capillary: 89 mg/dL (ref 70–99)
Glucose-Capillary: 83 mg/dL (ref 70–99)
Glucose-Capillary: 117 mg/dL — ABNORMAL HIGH (ref 70–99)
Glucose-Capillary: 103 mg/dL — ABNORMAL HIGH (ref 70–99)

## 2019-06-20 MED ORDER — CHLORHEXIDINE GLUCONATE CLOTH 2 % EX PADS
6.0000 | MEDICATED_PAD | Freq: Every day | CUTANEOUS | Status: DC
  Administered 2019-06-21 – 2019-06-22 (×2): 6 via TOPICAL

## 2019-06-20 MED ORDER — SODIUM CHLORIDE 0.9 % IV SOLN
2.0000 g | INTRAVENOUS | Status: DC
  Administered 2019-06-20 – 2019-06-21 (×2): 2 g via INTRAVENOUS
  Filled 2019-06-20: qty 20
  Filled 2019-06-20: qty 2
  Filled 2019-06-20: qty 20

## 2019-06-20 MED ORDER — SODIUM CHLORIDE 0.9 % IV SOLN
INTRAVENOUS | Status: DC | PRN
  Administered 2019-06-20: 250 mL via INTRAVENOUS

## 2019-06-20 MED ORDER — MUPIROCIN 2 % EX OINT
1.0000 "application " | TOPICAL_OINTMENT | Freq: Two times a day (BID) | CUTANEOUS | Status: DC
  Administered 2019-06-20 – 2019-06-22 (×4): 1 via NASAL
  Filled 2019-06-20 (×2): qty 22

## 2019-06-20 MED ORDER — AZITHROMYCIN 250 MG PO TABS
500.0000 mg | ORAL_TABLET | Freq: Every day | ORAL | Status: DC
  Administered 2019-06-20 – 2019-06-22 (×3): 500 mg via ORAL
  Filled 2019-06-20 (×3): qty 2

## 2019-06-20 MED ORDER — SODIUM CHLORIDE 0.9% FLUSH
10.0000 mL | Freq: Two times a day (BID) | INTRAVENOUS | Status: DC
  Administered 2019-06-20 – 2019-06-21 (×3): 10 mL via INTRAVENOUS

## 2019-06-20 NOTE — Progress Notes (Signed)
PROGRESS NOTE    [name]  PXT:[id number] DOB: May 10, 1940 DOA: 06/19/2019 PCP: [facility]   Brief Narrative:  Katherine [name] is a 79 y.o. female with medical history significant of hypertension, hyperlipidemia, diabetes mellitus, COPD on 3 L oxygen at home, CAD, stroke, GERD, depression, anxiety, seizure, CKD stage III, dCHF, COVID-19 infection 01/03/2019, iron deficiency anemia, who presents with fever and altered mental status.  Patient currently residing at [facility]  On presentation she has temperature of 101.2, borderline soft blood pressure with good response to IV fluid.  Admitted for sepsis secondary to UTI.  Subjective: Patient appears to be back to her baseline.  She has no new complaints.  She does not know why she is here.  She was able to answer all orientation questions except year.  Denies any urinary symptoms.  She recently had some cough and congestion along with generalized malaise.  Denies any chest pain or shortness of breath.  Assessment & Plan:   Principal Problem:   UTI (urinary tract infection) Active Problems:   Essential hypertension   GERD (gastroesophageal reflux disease)   COPD (chronic obstructive pulmonary disease) (HCC)   Coronary artery disease   Hyperlipemia   Sepsis (HCC)   Chronic diastolic heart failure (HCC)   Sepsis secondary to UTI ([place])   Acute renal failure superimposed on stage 3b chronic kidney disease (HCC)   Seizure disorder (HCC)   Iron deficiency anemia   Type II diabetes mellitus with renal manifestations ([place])   Stroke ([place])   AAA (abdominal aortic aneurysm) (HCC)   Acute metabolic encephalopathy  Acute metabolic encephalopathy.  Resolved.  Patient appears back to her baseline. Most likely multifactorial including possible UTI, or viral illness with some worsening of renal function.  UTI.  Patient was initially meeting criteria for sepsis with hypotension, leukocytosis, tachycardia and tachypnea with elevated lactic  acidosis.  Blood pressure responded well to IV fluid.  She was started on meropenem due to history of ESBL E. coli UTI in July 2020. Urine cultures were just growing yeast.  No urinary symptoms. Procalcitonin at 0.85. -Discontinue meropenem. -Start her on Rocephin as it will also cover for any possible respiratory cause till we  have more data available. -Add fungal culture with existing blood culture. -Continue Diflucan for now-if blood culture remain negative, there is no need to treat for yeast in urine.  Chronic respiratory failure/COPD.  Appears stable.  Saturating well on her home oxygen of 3 L. -Continue home bronchodilators.  Hypertension.  Blood pressure mildly elevated today.  She is not on any antihypertensives at home. -Continue to monitor for now.  Coronary artery disease -Continue Lipitor, as needed nitroglycerin  Hyperlipemia -Lipitor  Chronic diastolic heart failure ([place]): 2D echo on 07/03/2018 showed EF of 55-60%.  Patient does not have leg edema or JVD.  CHF seem to be compensated. - BNP stable at 117.  Acute renal failure superimposed on stage 3b chronic kidney disease ([place]): Likely prerenal with volume depletion secondary to fever.  Renal function seems improving. -Continue to monitor. -Avoid nephrotoxins.  Seizure -Seizure precaution -When necessary Ativan for seizure -Continue Home medications: Keppra and lacosamide  Iron deficiency anemia: hgb 11.3 on admission decreased to 9.3 this morning.  Most likely dilutional as patient received IV fluid and all cell lines decreased. -Continue to monitor. -Continue iron supplement  Type II diabetes mellitus with renal manifestations ([place]): Most recent A1c 5.2 , well controled. Patient is taking Tradjenta at home -SSI  Hx of  Stroke (  [place]): -Lipitor  AAA (abdominal aortic aneurysm) ([place]): -f/u with PCP  Eliquis use: pt is on Eliquis, but not sure why pt is on this medications. Her daughter and husband  both do not know why. -Will continue Eliquis now  GERD (gastroesophageal reflux disease): -protonix  Objective: Vitals:   06/19/19 2124 06/20/19 0019 06/20/19 0409 06/20/19 0818  BP:  120/69 (!) 141/78 93/62  Pulse: 99 98 (!) 102 97  Resp: (!) 22  20 20   Temp: 98.5 F (36.9 C)  98 F (36.7 C) (!) 97.4 F (36.3 C)  TempSrc: Oral  Oral Oral  SpO2: 93%  96% 98%  Weight:   91.7 kg   Height:        Intake/Output Summary (Last 24 hours) at 06/20/2019 1433 Last data filed at 06/20/2019 0411 Gross per 24 hour  Intake 920 ml  Output 200 ml  Net 720 ml   Filed Weights   06/19/19 0113 06/19/19 1616 06/20/19 0409  Weight: 91.4 kg 93.9 kg 91.7 kg    Examination:  General exam: Chronically ill-appearing elderly lady, appears calm and comfortable  Respiratory system: Clear to auscultation. Respiratory effort normal. Cardiovascular system: S1 & S2 heard, RRR. No JVD, murmurs, rubs, gallops or clicks. Gastrointestinal system: Soft, nontender, nondistended, bowel sounds positive. Central nervous system: Alert and oriented. No focal neurological deficits. Extremities: No edema, no cyanosis, pulses intact and symmetrical. Psychiatry: Judgement and insight appear normal.    DVT prophylaxis: Eliquis Code Status: Full Family Communication: Husband was updated on phone. Disposition Plan:  Status is: Inpatient  Remains inpatient appropriate because:Inpatient level of care appropriate due to severity of illness   Dispo: The patient is from: SNF              Anticipated d/c is to: SNF              Anticipated d/c date is: 2 days              Patient currently is not medically stable to d/c.   Consultants:   None  Procedures:  Antimicrobials:  Rocephin  Data Reviewed: I have personally reviewed following labs and imaging studies  CBC: Recent Labs  Lab 06/19/19 0137 06/20/19 0448  WBC 13.6* 9.4  NEUTROABS 12.4*  --   HGB 11.3* 9.3*  HCT 35.3* 28.8*  MCV 92.7 91.7  PLT  326 382   Basic Metabolic Panel: Recent Labs  Lab 06/19/19 0137 06/20/19 0448  NA 132* 138  K 4.1 4.4  CL 105 110  CO2 20* 23  GLUCOSE 121* 96  BUN 21 25*  CREATININE 1.47* 1.31*  CALCIUM 9.6 9.2   GFR: Estimated Creatinine Clearance: 36.3 mL/min (A) (by C-G formula based on SCr of 1.31 mg/dL (H)). Liver Function Tests: Recent Labs  Lab 06/19/19 0137  AST 38  ALT 17  ALKPHOS 177*  BILITOT 0.6  PROT 7.2  ALBUMIN 2.4*   Recent Labs  Lab 06/19/19 0137  LIPASE 34   No results for input(s): AMMONIA in the last 168 hours. Coagulation Profile: Recent Labs  Lab 06/19/19 0137  INR 1.4*   Cardiac Enzymes: No results for input(s): CKTOTAL, CKMB, CKMBINDEX, TROPONINI in the last 168 hours. BNP (last 3 results) No results for input(s): PROBNP in the last 8760 hours. HbA1C: No results for input(s): HGBA1C in the last 72 hours. CBG: Recent Labs  Lab 06/19/19 1617 06/19/19 2147 06/20/19 0818 06/20/19 1224  GLUCAP 98 74 89 83   Lipid Profile: No  results for input(s): CHOL, HDL, LDLCALC, TRIG, CHOLHDL, LDLDIRECT in the last 72 hours. Thyroid Function Tests: No results for input(s): TSH, T4TOTAL, FREET4, T3FREE, THYROIDAB in the last 72 hours. Anemia Panel: No results for input(s): VITAMINB12, FOLATE, FERRITIN, TIBC, IRON, RETICCTPCT in the last 72 hours. Sepsis Labs: Recent Labs  Lab 06/19/19 0136 06/19/19 0137 06/19/19 0415 06/19/19 1018 06/19/19 1145  PROCALCITON  --  0.85  --   --   --   LATICACIDVEN 2.8*  --  2.2* 1.7 1.4    Recent Results (from the past 240 hour(s))  Respiratory Panel by RT PCR (Flu A&B, Covid) - Nasopharyngeal Swab     Status: None   Collection Time: 06/19/19  1:26 AM   Specimen: Nasopharyngeal Swab  Result Value Ref Range Status   SARS Coronavirus 2 by RT PCR NEGATIVE NEGATIVE Final    Comment: (NOTE) SARS-CoV-2 target nucleic acids are NOT DETECTED. The SARS-CoV-2 RNA is generally detectable in upper respiratoy specimens during  the acute phase of infection. The lowest concentration of SARS-CoV-2 viral copies this assay can detect is 131 copies/mL. A negative result does not preclude SARS-Cov-2 infection and should not be used as the sole basis for treatment or other patient management decisions. A negative result may occur with  improper specimen collection/handling, submission of specimen other than nasopharyngeal swab, presence of viral mutation(s) within the areas targeted by this assay, and inadequate number of viral copies (<131 copies/mL). A negative result must be combined with clinical observations, patient history, and epidemiological information. The expected result is Negative. Fact Sheet for Patients:  PinkCheek.be Fact Sheet for Healthcare Providers:  GravelBags.it This test is not yet ap proved or cleared by the [place] FDA and  has been authorized for detection and/or diagnosis of SARS-CoV-2 by FDA under an Emergency Use Authorization (EUA). This EUA will remain  in effect (meaning this test can be used) for the duration of the COVID-19 declaration under Section 564(b)(1) of the Act, 21 U.S.C. section 360bbb-3(b)(1), unless the authorization is terminated or revoked sooner.    Influenza A by PCR NEGATIVE NEGATIVE Final   Influenza B by PCR NEGATIVE NEGATIVE Final    Comment: (NOTE) The Xpert Xpress SARS-CoV-2/FLU/RSV assay is intended as an aid in  the diagnosis of influenza from Nasopharyngeal swab specimens and  should not be used as a sole basis for treatment. Nasal washings and  aspirates are unacceptable for Xpert Xpress SARS-CoV-2/FLU/RSV  testing. Fact Sheet for Patients: PinkCheek.be Fact Sheet for Healthcare Providers: GravelBags.it This test is not yet approved or cleared by the [place] FDA and  has been authorized for detection and/or diagnosis of  SARS-CoV-2 by  FDA under an Emergency Use Authorization (EUA). This EUA will remain  in effect (meaning this test can be used) for the duration of the  Covid-19 declaration under Section 564(b)(1) of the Act, 21  U.S.C. section 360bbb-3(b)(1), unless the authorization is  terminated or revoked. Performed at [facility], [address] [place], [place] [address]   Blood Culture (routine x 2)     Status: None (Preliminary result)   Collection Time: 06/19/19  1:37 AM   Specimen: Left Antecubital; Blood  Result Value Ref Range Status   Specimen Description LEFT ANTECUBITAL  Final   Special Requests   Final    BOTTLES DRAWN AEROBIC AND ANAEROBIC Blood Culture adequate volume   Culture   Final    NO GROWTH 1 DAY Performed at [facility], [address] [place], [place] [address]    Report Status PENDING  Incomplete  Blood Culture (routine x 2)     Status: None (Preliminary result)   Collection Time: 06/19/19  1:37 AM   Specimen: Right Antecubital; Blood  Result Value Ref Range Status   Specimen Description RIGHT ANTECUBITAL  Final   Special Requests   Final    BOTTLES DRAWN AEROBIC AND ANAEROBIC Blood Culture results may not be optimal due to an inadequate volume of blood received in culture bottles   Culture   Final    NO GROWTH 1 DAY Performed at [facility], [address] [place], [place] [address]    Report Status PENDING  Incomplete  Urine culture     Status: Abnormal   Collection Time: 06/19/19  1:37 AM   Specimen: In/Out Cath Urine  Result Value Ref Range Status   Specimen Description   Final    IN/OUT CATH URINE Performed at [facility], [address], [place], [place] [address]    Special Requests   Final    NONE Performed at [facility], [place], [place] [address]    Culture 50,000 COLONIES/mL YEAST (A)  Final   Report Status 06/19/2019 FINAL  Final  MRSA PCR Screening     Status: Abnormal     Collection Time: 06/19/19  5:07 PM   Specimen: Nasopharyngeal  Result Value Ref Range Status   MRSA by PCR POSITIVE (A) NEGATIVE Final    Comment:        The GeneXpert MRSA Assay (FDA approved for NASAL specimens only), is one component of a comprehensive MRSA colonization surveillance program. It is not intended to diagnose MRSA infection nor to guide or monitor treatment for MRSA infections. RESULT CALLED TO, READ BACK BY AND VERIFIED WITH: Arlina Robes AT 1922 ON 06/19/2019 BY MOSLEY,J Performed at [facility], [address] [place],  [address]      Radiology Studies: [facility] [place] 1 View  Result Date: 06/19/2019 CLINICAL DATA:  79 year old female with sepsis. EXAM: PORTABLE CHEST 1 VIEW COMPARISON:  Chest radiograph dated 06/08/2019. FINDINGS: Small right pleural effusion similar or slightly increased since the prior radiograph. Trace left pleural effusion may be present. There is background of emphysema. Bilateral mid to lower lung field streaky densities may represent atelectasis/scarring or infiltrate. Overall similar appearance since the prior radiograph. No new consolidation. There is no pneumothorax. Stable cardiomediastinal silhouette. Atherosclerotic calcification of the aorta. No acute osseous pathology. IMPRESSION: 1. Small right pleural effusion, similar or slightly increased since the prior radiograph. 2. Bibasilar atelectasis/scarring.  No new consolidation. Electronically Signed   By: Anner Crete M.D.   On: 06/19/2019 01:49   CT Renal Stone Study  Result Date: 06/19/2019 CLINICAL DATA:  Flank pain with kidney stone suspected EXAM: CT ABDOMEN AND PELVIS WITHOUT CONTRAST TECHNIQUE: Multidetector CT imaging of the abdomen and pelvis was performed following the standard protocol without IV contrast. COMPARISON:  11/21/2018 FINDINGS: Lower chest: Loculated pleural fluid on the right with subpleural reticulation. The effusion is small volume but  extensive where covered. Extensive aortic and coronary calcification. Hepatobiliary: No focal liver abnormality.Cholecystectomy. No bile duct dilatation. Pancreas: Unremarkable. Spleen: Unremarkable. Adrenals/Urinary Tract: Negative adrenals. No hydronephrosis or ureteral stone. Asymmetric left renal atrophy. Small cystic density at the upper pole left kidney that is stable from prior enhanced scan. Punctate right lower pole calculus. 8 mm nodule in the posterior pararenal space on the right with emanating  vessel, essentially stable from prior and considered benign. Unremarkable bladder. Stomach/Bowel:  No obstruction. No evidence of bowel inflammation. Vascular/Lymphatic: No acute vascular abnormality. Extensive atherosclerosis of the aorta and iliacs. Fusiform abdominal aortic aneurysm measuring up to 4.2 Cm in diameter on orthogonal measurements by sagittal reformats. When measured in a similar fashion there is no change. No change in the degree of ventral intimal calcification distortion at this level. No adenopathy Reproductive:No pathologic findings. Other: No ascites or pneumoperitoneum. Musculoskeletal: No acute abnormalities. Disc degeneration and spondylosis. IMPRESSION: 1. No acute finding. There is a small right renal calculus that is nonobstructive. 2. Complex/loculated right pleural effusion that is small volume. This effusion has increased from 2020 imaging, without evident cause. 3.  Aortic Atherosclerosis (ICD10-I70.0).  Coronary atherosclerosis. 4. 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 No growth since CT in 2020. Recommend followup by ultrasound in 1 year. This recommendation follows ACR consensus guidelines: White Paper of the ACR Incidental Findings Committee II on Vascular Findings. J Am Coll Radiol 2013; 10:789-794. Aortic aneurysm NOS (ICD10-I71.9) Electronically Signed   By: Monte Fantasia M.D.   On: 06/19/2019 04:16    Scheduled Meds: . apixaban  5 mg Oral BID  .  ascorbic acid  500 mg Oral Daily  . atorvastatin  80 mg Oral QPM  . azithromycin  500 mg Oral Daily  . cholecalciferol  1,000 Units Oral Daily  . ferrous sulfate  325 mg Oral BID  . fluticasone furoate-vilanterol  1 puff Inhalation Daily   And  . umeclidinium bromide  1 puff Inhalation Daily  . gabapentin  300 mg Oral QHS  . insulin aspart  0-5 Units Subcutaneous QHS  . insulin aspart  0-9 Units Subcutaneous TID WC  . ipratropium-albuterol  3 mL Nebulization TID  . lacosamide  100 mg Oral BID  . levETIRAcetam  1,500 mg Oral Daily  . loratadine  10 mg Oral Daily  . pantoprazole  40 mg Oral Daily  . saccharomyces boulardii  250 mg Oral BID  . zinc sulfate  220 mg Oral Daily   Continuous Infusions: . cefTRIAXone (ROCEPHIN)  IV 2 g (06/20/19 1423)  . fluconazole (DIFLUCAN) IV 100 mg (06/19/19 2152)     LOS: 1 day   Time spent: 40 minutes.  Lorella Nimrod, MD [facility]  If 7PM-7AM, please contact night-coverage [email or web address]  06/20/2019, 2:33 PM   This record has been created using Systems analyst. Errors have been sought and corrected,but may not always be located. Such creation errors do not reflect on the standard of care.

## 2019-06-21 LAB — CBC
HCT: 24.1 % — ABNORMAL LOW (ref 36.0–46.0)
Hemoglobin: 8.1 g/dL — ABNORMAL LOW (ref 12.0–15.0)
MCH: 30.1 pg (ref 26.0–34.0)
MCHC: 33.6 g/dL (ref 30.0–36.0)
MCV: 89.6 fL (ref 80.0–100.0)
Platelets: 253 10*3/uL (ref 150–400)
RBC: 2.69 MIL/uL — ABNORMAL LOW (ref 3.87–5.11)
RDW: 16.2 % — ABNORMAL HIGH (ref 11.5–15.5)
WBC: 6.4 10*3/uL (ref 4.0–10.5)
nRBC: 0 % (ref 0.0–0.2)

## 2019-06-21 LAB — GLUCOSE, CAPILLARY
Glucose-Capillary: 98 mg/dL (ref 70–99)
Glucose-Capillary: 86 mg/dL (ref 70–99)
Glucose-Capillary: 90 mg/dL (ref 70–99)
Glucose-Capillary: 89 mg/dL (ref 70–99)

## 2019-06-21 LAB — BASIC METABOLIC PANEL
Anion gap: 6 (ref 5–15)
BUN: 24 mg/dL — ABNORMAL HIGH (ref 8–23)
CO2: 23 mmol/L (ref 22–32)
Calcium: 8.9 mg/dL (ref 8.9–10.3)
Chloride: 107 mmol/L (ref 98–111)
Creatinine, Ser: 1.39 mg/dL — ABNORMAL HIGH (ref 0.44–1.00)
GFR calc Af Amer: 42 mL/min — ABNORMAL LOW (ref >60)
GFR calc non Af Amer: 36 mL/min — ABNORMAL LOW (ref >60)
Glucose, Bld: 115 mg/dL — ABNORMAL HIGH (ref 70–99)
Potassium: 3.7 mmol/L (ref 3.5–5.1)
Sodium: 136 mmol/L (ref 135–145)

## 2019-06-21 NOTE — Progress Notes (Signed)
PROGRESS NOTE    [name]  LHT:[id number] DOB: 1940-07-07 DOA: 06/19/2019 PCP: [facility]   Brief Narrative:  Katherine [name] is a 79 y.o. female with medical history significant of hypertension, hyperlipidemia, diabetes mellitus, COPD on 3 L oxygen at home, CAD, stroke, GERD, depression, anxiety, seizure, CKD stage III, dCHF, COVID-19 infection 01/03/2019, iron deficiency anemia, who presents with fever and altered mental status.  Patient currently residing at [facility].  On presentation she has temperature of 101.2, borderline soft blood pressure with good response to IV fluid.  Admitted for sepsis secondary to UTI.  Subjective:   She has no new complaints.  She was complaining about her arthritic pains which are chronic.  She denies any melena or obvious bleeding.  Assessment & Plan:   Principal Problem:   UTI (urinary tract infection) Active Problems:   Essential hypertension   GERD (gastroesophageal reflux disease)   COPD (chronic obstructive pulmonary disease) (HCC)   Coronary artery disease   Hyperlipemia   Sepsis (HCC)   Chronic diastolic heart failure (HCC)   Sepsis secondary to UTI ([place])   Acute renal failure superimposed on stage 3b chronic kidney disease (HCC)   Seizure disorder (HCC)   Iron deficiency anemia   Type II diabetes mellitus with renal manifestations ([place])   Stroke ([place])   AAA (abdominal aortic aneurysm) (HCC)   Acute metabolic encephalopathy  Acute metabolic encephalopathy.  Resolved.  Patient appears back to her baseline. Most likely multifactorial including possible UTI, or viral illness with some worsening of renal function.  UTI.  Patient was initially meeting criteria for sepsis with hypotension, leukocytosis, tachycardia and tachypnea with elevated lactic acidosis.  Blood pressure responded well to IV fluid.  She was started on meropenem due to history of ESBL E. coli UTI in July 2020. Urine cultures were just growing yeast.  No  urinary symptoms. Procalcitonin at 0.85. -Discontinue meropenem. -Start her on Rocephin as it will also cover for any possible respiratory cause till we  have more data available. -Add fungal culture with existing blood culture-ending results. -Continue Diflucan.  Chronic respiratory failure/COPD.  Appears stable.  Saturating well on her home oxygen of 3 L. -Continue home bronchodilators.  Hypertension.  Blood pressure within goal.  She is not on any antihypertensives at home. -Continue to monitor for now.  Coronary artery disease -Continue Lipitor, as needed nitroglycerin  Hyperlipemia -Lipitor  Chronic diastolic heart failure ([place]): 2D echo on 07/03/2018 showed EF of 55-60%.  Patient does not have leg edema or JVD.  CHF seem to be compensated. - BNP stable at 117.  Acute renal failure superimposed on stage 3b chronic kidney disease ([place]): Likely prerenal with volume depletion secondary to fever.  Renal function seems improving. -Continue to monitor. -Avoid nephrotoxins.  Seizure -Seizure precaution -When necessary Ativan for seizure -Continue Home medications: Keppra and lacosamide  Iron deficiency anemia: hgb 11.3 on admission >>9.3>>8.1. -No obvious bleeding. -Check FOBT. -Discontinue Eliquis. -Continue to monitor. -Continue iron supplement  Type II diabetes mellitus with renal manifestations ([place]): Most recent A1c 5.2 , well controled. Patient is taking Tradjenta at home -SSI  Hx of  Stroke [facility]): -Lipitor  AAA (abdominal aortic aneurysm) ([place]): -f/u with PCP  Eliquis use: Patient was placed on Eliquis last year in October when she was admitted with PE.  It was provoked as patient was bedridden.  1st episode. -I will discontinue Eliquis at this time due to decreased in hemoglobin and she was seen in ED multiple times due  to epistaxis since she was taking Eliquis. -She might need lifelong anticoagulation if recurrent episodes of PE or DVT.  GERD  (gastroesophageal reflux disease): -protonix  Objective: Vitals:   06/20/19 1949 06/21/19 0424 06/21/19 0723 06/21/19 1208  BP: (!) 105/59 109/70 95/62 101/66  Pulse: 97 89 79 87  Resp: 18 19 14 18   Temp: (!) 97.2 F (36.2 C) 98.3 F (36.8 C) 98.5 F (36.9 C) 98.1 F (36.7 C)  TempSrc: Oral Oral    SpO2: 99% 100% 100% 100%  Weight:  94.2 kg    Height:        Intake/Output Summary (Last 24 hours) at 06/21/2019 1411 Last data filed at 06/21/2019 0519 Gross per 24 hour  Intake 240 ml  Output 650 ml  Net -410 ml   Filed Weights   06/19/19 1616 06/20/19 0409 06/21/19 0424  Weight: 93.9 kg 91.7 kg 94.2 kg    Examination:  General exam: Chronically ill-appearing elderly lady, appears calm and comfortable  Respiratory system: Clear to auscultation. Respiratory effort normal. Cardiovascular system: S1 & S2 heard, RRR. No JVD, murmurs, rubs, gallops or clicks. Gastrointestinal system: Soft, nontender, nondistended, bowel sounds positive. Central nervous system: Alert and oriented. No focal neurological deficits. Extremities: No edema, no cyanosis, pulses intact and symmetrical. Psychiatry: Judgement and insight appear normal.    DVT prophylaxis:  SCDs Code Status: Full Family Communication: No family at bedside. Disposition Plan:  Status is: Inpatient  Remains inpatient appropriate because:Inpatient level of care appropriate due to severity of illness   Dispo: The patient is from: SNF              Anticipated d/c is to: SNF              Anticipated d/c date is: 2 days              Patient currently is not medically stable to d/c.   Consultants:   None  Procedures:  Antimicrobials:  Rocephin  Data Reviewed: I have personally reviewed following labs and imaging studies  CBC: Recent Labs  Lab 06/19/19 0137 06/20/19 0448 06/21/19 0643  WBC 13.6* 9.4 6.4  NEUTROABS 12.4*  --   --   HGB 11.3* 9.3* 8.1*  HCT 35.3* 28.8* 24.1*  MCV 92.7 91.7 89.6  PLT 326 262  299   Basic Metabolic Panel: Recent Labs  Lab 06/19/19 0137 06/20/19 0448 06/21/19 0643  NA 132* 138 136  K 4.1 4.4 3.7  CL 105 110 107  CO2 20* 23 23  GLUCOSE 121* 96 115*  BUN 21 25* 24*  CREATININE 1.47* 1.31* 1.39*  CALCIUM 9.6 9.2 8.9   GFR: Estimated Creatinine Clearance: 34.8 mL/min (A) (by C-G formula based on SCr of 1.39 mg/dL (H)). Liver Function Tests: Recent Labs  Lab 06/19/19 0137  AST 38  ALT 17  ALKPHOS 177*  BILITOT 0.6  PROT 7.2  ALBUMIN 2.4*   Recent Labs  Lab 06/19/19 0137  LIPASE 34   No results for input(s): AMMONIA in the last 168 hours. Coagulation Profile: Recent Labs  Lab 06/19/19 0137  INR 1.4*   Cardiac Enzymes: No results for input(s): CKTOTAL, CKMB, CKMBINDEX, TROPONINI in the last 168 hours. BNP (last 3 results) No results for input(s): PROBNP in the last 8760 hours. HbA1C: No results for input(s): HGBA1C in the last 72 hours. CBG: Recent Labs  Lab 06/20/19 1224 06/20/19 1639 06/20/19 2108 06/21/19 0721 06/21/19 1209  GLUCAP 83 117* 103* 98 86  Lipid Profile: No results for input(s): CHOL, HDL, LDLCALC, TRIG, CHOLHDL, LDLDIRECT in the last 72 hours. Thyroid Function Tests: No results for input(s): TSH, T4TOTAL, FREET4, T3FREE, THYROIDAB in the last 72 hours. Anemia Panel: No results for input(s): VITAMINB12, FOLATE, FERRITIN, TIBC, IRON, RETICCTPCT in the last 72 hours. Sepsis Labs: Recent Labs  Lab 06/19/19 0136 06/19/19 0137 06/19/19 0415 06/19/19 1018 06/19/19 1145  PROCALCITON  --  0.85  --   --   --   LATICACIDVEN 2.8*  --  2.2* 1.7 1.4    Recent Results (from the past 240 hour(s))  Respiratory Panel by RT PCR (Flu A&B, Covid) - Nasopharyngeal Swab     Status: None   Collection Time: 06/19/19  1:26 AM   Specimen: Nasopharyngeal Swab  Result Value Ref Range Status   SARS Coronavirus 2 by RT PCR NEGATIVE NEGATIVE Final    Comment: (NOTE) SARS-CoV-2 target nucleic acids are NOT DETECTED. The  SARS-CoV-2 RNA is generally detectable in upper respiratoy specimens during the acute phase of infection. The lowest concentration of SARS-CoV-2 viral copies this assay can detect is 131 copies/mL. A negative result does not preclude SARS-Cov-2 infection and should not be used as the sole basis for treatment or other patient management decisions. A negative result may occur with  improper specimen collection/handling, submission of specimen other than nasopharyngeal swab, presence of viral mutation(s) within the areas targeted by this assay, and inadequate number of viral copies (<131 copies/mL). A negative result must be combined with clinical observations, patient history, and epidemiological information. The expected result is Negative. Fact Sheet for Patients:  PinkCheek.be Fact Sheet for Healthcare Providers:  GravelBags.it This test is not yet ap proved or cleared by the [place] FDA and  has been authorized for detection and/or diagnosis of SARS-CoV-2 by FDA under an Emergency Use Authorization (EUA). This EUA will remain  in effect (meaning this test can be used) for the duration of the COVID-19 declaration under Section 564(b)(1) of the Act, 21 U.S.C. section 360bbb-3(b)(1), unless the authorization is terminated or revoked sooner.    Influenza A by PCR NEGATIVE NEGATIVE Final   Influenza B by PCR NEGATIVE NEGATIVE Final    Comment: (NOTE) The Xpert Xpress SARS-CoV-2/FLU/RSV assay is intended as an aid in  the diagnosis of influenza from Nasopharyngeal swab specimens and  should not be used as a sole basis for treatment. Nasal washings and  aspirates are unacceptable for Xpert Xpress SARS-CoV-2/FLU/RSV  testing. Fact Sheet for Patients: PinkCheek.be Fact Sheet for Healthcare Providers: GravelBags.it This test is not yet approved or cleared by the [place] FDA and  has been authorized for detection and/or diagnosis of SARS-CoV-2 by  FDA under an Emergency Use Authorization (EUA). This EUA will remain  in effect (meaning this test can be used) for the duration of the  Covid-19 declaration under Section 564(b)(1) of the Act, 21  U.S.C. section 360bbb-3(b)(1), unless the authorization is  terminated or revoked. Performed at [facility], [address] [place], [place] [address]   Blood Culture (routine x 2)     Status: None (Preliminary result)   Collection Time: 06/19/19  1:37 AM   Specimen: Left Antecubital; Blood  Result Value Ref Range Status   Specimen Description LEFT ANTECUBITAL  Final   Special Requests   Final    BOTTLES DRAWN AEROBIC AND ANAEROBIC Blood Culture adequate volume   Culture   Final    NO GROWTH 2 DAYS Performed at [facility], [place], [place] [address]    Report Status PENDING  Incomplete  Blood Culture (routine x 2)     Status: None (Preliminary result)   Collection Time: 06/19/19  1:37 AM   Specimen: Right Antecubital; Blood  Result Value Ref Range Status   Specimen Description RIGHT ANTECUBITAL  Final   Special Requests   Final    BOTTLES DRAWN AEROBIC AND ANAEROBIC Blood Culture results may not be optimal due to an inadequate volume of blood received in culture bottles   Culture   Final    NO GROWTH 2 DAYS Performed at [facility], [address] [place], [place] [address]    Report Status PENDING  Incomplete  Urine culture     Status: Abnormal   Collection Time: 06/19/19  1:37 AM   Specimen: In/Out Cath Urine  Result Value Ref Range Status   Specimen Description   Final    IN/OUT CATH URINE Performed at [facility], [address], [place], [place] [address]    Special Requests   Final    NONE Performed at [facility], [place], [place] [address]    Culture 50,000 COLONIES/mL YEAST (A)  Final   Report  Status 06/19/2019 FINAL  Final  MRSA PCR Screening     Status: Abnormal   Collection Time: 06/19/19  5:07 PM   Specimen: Nasopharyngeal  Result Value Ref Range Status   MRSA by PCR POSITIVE (A) NEGATIVE Final    Comment:        The GeneXpert MRSA Assay (FDA approved for NASAL specimens only), is one component of a comprehensive MRSA colonization surveillance program. It is not intended to diagnose MRSA infection nor to guide or monitor treatment for MRSA infections. RESULT CALLED TO, READ BACK BY AND VERIFIED WITH: Minette Brine, JENNIFER AT 1922 ON 06/19/2019 BY MOSLEY,J Performed at [facility], [address] [place], [place] [address]      Radiology Studies: No results found.  Scheduled Meds: . ascorbic acid  500 mg Oral Daily  . atorvastatin  80 mg Oral QPM  . azithromycin  500 mg Oral Daily  . Chlorhexidine Gluconate Cloth  6 each Topical Q0600  . cholecalciferol  1,000 Units Oral Daily  . ferrous sulfate  325 mg Oral BID  . fluticasone furoate-vilanterol  1 puff Inhalation Daily   And  . umeclidinium bromide  1 puff Inhalation Daily  . gabapentin  300 mg Oral QHS  . insulin aspart  0-5 Units Subcutaneous QHS  . insulin aspart  0-9 Units Subcutaneous TID WC  . ipratropium-albuterol  3 mL Nebulization TID  . lacosamide  100 mg Oral BID  . levETIRAcetam  1,500 mg Oral Daily  . loratadine  10 mg Oral Daily  . mupirocin ointment  1 application Nasal BID  . pantoprazole  40 mg Oral Daily  . saccharomyces boulardii  250 mg Oral BID  . sodium chloride flush  10 mL Intravenous Q12H  . zinc sulfate  220 mg Oral Daily   Continuous Infusions: . sodium chloride 250 mL (06/20/19 2125)  . cefTRIAXone (ROCEPHIN)  IV 2 g (06/21/19 1259)     LOS: 2 days   Time spent: 40 minutes.  Lorella Nimrod, MD [facility]  If 7PM-7AM, please contact night-coverage [email or web address]  06/21/2019, 2:11 PM   This record has been created using TEFL teacher. Errors have been sought and corrected,but may not always be located. Such  creation errors do not reflect on the standard of care.

## 2019-06-21 NOTE — Plan of Care (Signed)
  Problem: Clinical Measurements: Goal: Ability to maintain clinical measurements within normal limits will improve Outcome: Progressing   Problem: Skin Integrity: Goal: Risk for impaired skin integrity will decrease Outcome: Progressing   Problem: Urinary Elimination: Goal: Signs and symptoms of infection will decrease Outcome: Progressing

## 2019-06-21 NOTE — Plan of Care (Signed)
  Problem: Education: Goal: Knowledge of General Education information will improve Description: Including pain rating scale, medication(s)/side effects and non-pharmacologic comfort measures Outcome: Not Progressing Note: Patient is confused today. Unsure of baseline. Reportedly from a SNF. Will continue to monitor neurological status for the remainder of the shift. Wenda Low [facility]

## 2019-06-21 NOTE — Progress Notes (Signed)
POCT 89 administered 4 oz of cranberry juice

## 2019-06-22 DIAGNOSIS — I2510 Atherosclerotic heart disease of native coronary artery without angina pectoris: Secondary | ICD-10-CM

## 2019-06-22 LAB — CBC
HCT: 26.1 % — ABNORMAL LOW (ref 36.0–46.0)
Hemoglobin: 8.7 g/dL — ABNORMAL LOW (ref 12.0–15.0)
MCH: 29.7 pg (ref 26.0–34.0)
MCHC: 33.3 g/dL (ref 30.0–36.0)
MCV: 89.1 fL (ref 80.0–100.0)
Platelets: 269 10*3/uL (ref 150–400)
RBC: 2.93 MIL/uL — ABNORMAL LOW (ref 3.87–5.11)
RDW: 15.9 % — ABNORMAL HIGH (ref 11.5–15.5)
WBC: 4.9 10*3/uL (ref 4.0–10.5)
nRBC: 0 % (ref 0.0–0.2)

## 2019-06-22 LAB — GLUCOSE, CAPILLARY
Glucose-Capillary: 74 mg/dL (ref 70–99)
Glucose-Capillary: 76 mg/dL (ref 70–99)

## 2019-06-22 LAB — BASIC METABOLIC PANEL
Anion gap: 5 (ref 5–15)
BUN: 21 mg/dL (ref 8–23)
CO2: 24 mmol/L (ref 22–32)
Calcium: 9.3 mg/dL (ref 8.9–10.3)
Chloride: 107 mmol/L (ref 98–111)
Creatinine, Ser: 1.27 mg/dL — ABNORMAL HIGH (ref 0.44–1.00)
GFR calc Af Amer: 47 mL/min — ABNORMAL LOW (ref >60)
GFR calc non Af Amer: 41 mL/min — ABNORMAL LOW (ref >60)
Glucose, Bld: 85 mg/dL (ref 70–99)
Potassium: 4.3 mmol/L (ref 3.5–5.1)
Sodium: 136 mmol/L (ref 135–145)

## 2019-06-22 MED ORDER — AZITHROMYCIN 250 MG PO TABS: 250.0000 mg | ORAL_TABLET | Freq: Every day | ORAL | 0 refills | Status: AC

## 2019-06-22 NOTE — Discharge Summary (Signed)
Physician Discharge Summary  Katherine [name] DTO:[id number] DOB: April 19, 1940 DOA: 06/19/2019  PCP: Orvis Brill, [facility]  Admit date: 06/19/2019 Discharge date: 06/22/2019  Admitted From: SNF Disposition:  SNF  Recommendations for Outpatient Follow-up:  1. Follow up with PCP in 1-2 weeks 2. Please obtain BMP/CBC in one week 3. Please follow up on the following pending results: Fungal blood culture  Home Health:No Equipment/Devices:None Discharge Condition: Fair CODE STATUS: Full Diet recommendation: Heart Healthy / Carb Modified   Brief/Interim Summary: Katherine Jonesis a 79 y.o.femalewith medical history significant ofhypertension, hyperlipidemia, diabetes mellitus, COPD on 3 L oxygen at home, CAD, stroke, GERD, depression, anxiety, seizure, CKD stage III,dCHF, COVID-19 infection 01/03/2019, iron deficiency anemia, who presents with fever and altered mental status.  Patient currently residing at [facility].  On presentation she has temperature of 101.2, borderline soft blood pressure with good response to IV fluid.  Admitted for sepsis secondary to UTI.  Urine culture just grew yeast, she did received 3 doses of Diflucan.  Blood cultures negative, blood fungal cultures pending.  Patient remained afebrile during her stay in hospital.  Mental status back to baseline next morning.  She was initially given couple of doses of meropenem because of concern of ESBL E. coli which was positive in one of her urine cultures done in July 2020.  Meropenem was discontinued once urine culture only grew yeast.  She was given a course of ceftriaxone and azithromycin for a possible CAP.  She was discharged home for 1 more day of azithromycin.  Respiratory status remained stable on her baseline home oxygen of 3 L.  She will continue her bronchodilators.  Patient did had AKI with CKD stage IIIb. most likely prerenal secondary to volume depletion with fever.  Renal function improved and creatinine was 1.27 very close  to her baseline on discharge.  She will continue her bicarb tablets and follow-up with nephrology as an outpatient.  Follow-up worsening of iron deficiency anemia, no obvious sign of bleeding.  Patient was on Eliquis which was started in October 2020 when she was admitted with PE.  It was her first provoked event as patient was bedridden.  We discontinued Eliquis as she is being seen in ED multiple times due to epistaxis and worsening hemoglobin.  Hemoglobin was 8.7 on discharge.  She will need a close monitoring.  If she develops another recurrent episode of PE or DVT she might need lifelong anticoagulation.  She will continue with rest of her prior home meds.  Discharge Diagnoses:  Principal Problem:   UTI (urinary tract infection) Active Problems:   Essential hypertension   GERD (gastroesophageal reflux disease)   COPD (chronic obstructive pulmonary disease) (HCC)   Coronary artery disease   Hyperlipemia   Sepsis (HCC)   Chronic diastolic heart failure (HCC)   Sepsis secondary to UTI ([place])   Acute renal failure superimposed on stage 3b chronic kidney disease (HCC)   Seizure disorder (HCC)   Iron deficiency anemia   Type II diabetes mellitus with renal manifestations ([place])   Stroke ([place])   AAA (abdominal aortic aneurysm) ([place])   Acute metabolic encephalopathy  Discharge Instructions  Discharge Instructions    Diet - low sodium heart healthy   Complete by: As directed    Increase activity slowly   Complete by: As directed      Allergies as of 06/22/2019      Reactions   Bee Venom Swelling   Enalapril Maleate Swelling      Medication List  STOP taking these medications   apixaban 5 MG Tabs tablet Commonly known as: ELIQUIS   cephALEXin 500 MG capsule Commonly known as: KEFLEX     TAKE these medications   acetaminophen 650 MG CR tablet Commonly known as: TYLENOL Take 650 mg by mouth 3 (three) times daily.   albuterol 0.63 MG/3ML nebulizer solution Commonly  known as: ACCUNEB Take 3 mLs by nebulization every 8 (eight) hours as needed for wheezing.   ascorbic acid 500 MG tablet Commonly known as: VITAMIN C Take 1 tablet (500 mg total) by mouth daily.   atorvastatin 80 MG tablet Commonly known as: LIPITOR Take 80 mg by mouth every evening.   azithromycin 250 MG tablet Commonly known as: ZITHROMAX Take 1 tablet (250 mg total) by mouth daily for 1 day.   Biofreeze 4 % Gel Generic drug: Menthol (Topical Analgesic) Apply 1 application topically in the morning and at bedtime. To both knees   cetirizine 10 MG tablet Commonly known as: ZYRTEC Take 10 mg by mouth daily.   cholecalciferol 1000 units tablet Commonly known as: VITAMIN D Take 1,000 Units by mouth daily.   docusate sodium 100 MG capsule Commonly known as: COLACE Take 100 mg by mouth 2 (two) times daily.   ferrous sulfate 325 (65 FE) MG tablet Take 325 mg by mouth 2 (two) times daily.   Fluticasone-Umeclidin-Vilant 100-62.5-25 MCG/INH Aepb Inhale 1 puff into the lungs daily.   gabapentin 300 MG capsule Commonly known as: NEURONTIN Take 300 mg by mouth at bedtime.   ipratropium-albuterol 0.5-2.5 (3) MG/3ML Soln Commonly known as: DUONEB Take 3 mLs by nebulization 3 (three) times daily.   Lacosamide 100 MG Tabs Take 1 tablet (100 mg total) by mouth 2 (two) times daily.   levETIRAcetam 750 MG tablet Commonly known as: KEPPRA Take 1,500 mg by mouth daily.   linagliptin 5 MG Tabs tablet Commonly known as: TRADJENTA Take 5 mg by mouth daily.   meclizine 25 MG tablet Commonly known as: ANTIVERT Take 25 mg by mouth every 12 (twelve) hours as needed for dizziness.   mupirocin ointment 2 % Commonly known as: BACTROBAN Place 1 application into the nose 2 (two) times daily.   nitroGLYCERIN 0.4 MG SL tablet Commonly known as: NITROSTAT Place 0.4 mg under the tongue every 5 (five) minutes as needed for chest pain.   pantoprazole 40 MG tablet Commonly known as:  PROTONIX Take 1 tablet (40 mg total) by mouth daily.   Probiotic 250 MG Caps Take 1 capsule by mouth 2 times daily at 12 noon and 4 pm. What changed: when to take this   Saline Gel Place 2 sprays into the nose 3 (three) times daily.   zinc sulfate 220 (50 Zn) MG capsule Take 1 capsule (220 mg total) by mouth daily.       Allergies  Allergen Reactions  . Bee Venom Swelling  . Enalapril Maleate Swelling    Consultations:  None  Procedures/Studies: DG Chest Port 1 View  Result Date: 06/19/2019 CLINICAL DATA:  79 year old female with sepsis. EXAM: PORTABLE CHEST 1 VIEW COMPARISON:  Chest radiograph dated 06/08/2019. FINDINGS: Small right pleural effusion similar or slightly increased since the prior radiograph. Trace left pleural effusion may be present. There is background of emphysema. Bilateral mid to lower lung field streaky densities may represent atelectasis/scarring or infiltrate. Overall similar appearance since the prior radiograph. No new consolidation. There is no pneumothorax. Stable cardiomediastinal silhouette. Atherosclerotic calcification of the aorta. No acute osseous pathology. IMPRESSION:  1. Small right pleural effusion, similar or slightly increased since the prior radiograph. 2. Bibasilar atelectasis/scarring.  No new consolidation. Electronically Signed   By: Anner Crete M.D.   On: 06/19/2019 01:49   DG Chest Port 1 View  Result Date: 06/08/2019 CLINICAL DATA:  Possible aspiration EXAM: PORTABLE CHEST 1 VIEW COMPARISON:  03/02/2019 FINDINGS: Cardiomediastinal contours and hilar structures are stable. Similar pattern of linear opacities at the RIGHT lung base,. LEFT retrocardiac opacity also similar to the prior study. Signs of pleural thickening along the RIGHT chest. Visualized skeletal structures are unremarkable. IMPRESSION: 1. Stable signs of scarring in the chest with chronic pleural thickening and atelectasis at the RIGHT lung base. No new areas of dense  consolidation or sign of pleural effusion. Electronically Signed   By: Zetta Bills M.D.   On: 06/08/2019 15:02   CT Renal Stone Study  Result Date: 06/19/2019 CLINICAL DATA:  Flank pain with kidney stone suspected EXAM: CT ABDOMEN AND PELVIS WITHOUT CONTRAST TECHNIQUE: Multidetector CT imaging of the abdomen and pelvis was performed following the standard protocol without IV contrast. COMPARISON:  11/21/2018 FINDINGS: Lower chest: Loculated pleural fluid on the right with subpleural reticulation. The effusion is small volume but extensive where covered. Extensive aortic and coronary calcification. Hepatobiliary: No focal liver abnormality.Cholecystectomy. No bile duct dilatation. Pancreas: Unremarkable. Spleen: Unremarkable. Adrenals/Urinary Tract: Negative adrenals. No hydronephrosis or ureteral stone. Asymmetric left renal atrophy. Small cystic density at the upper pole left kidney that is stable from prior enhanced scan. Punctate right lower pole calculus. 8 mm nodule in the posterior pararenal space on the right with emanating vessel, essentially stable from prior and considered benign. Unremarkable bladder. Stomach/Bowel:  No obstruction. No evidence of bowel inflammation. Vascular/Lymphatic: No acute vascular abnormality. Extensive atherosclerosis of the aorta and iliacs. Fusiform abdominal aortic aneurysm measuring up to 4.2 Cm in diameter on orthogonal measurements by sagittal reformats. When measured in a similar fashion there is no change. No change in the degree of ventral intimal calcification distortion at this level. No adenopathy Reproductive:No pathologic findings. Other: No ascites or pneumoperitoneum. Musculoskeletal: No acute abnormalities. Disc degeneration and spondylosis. IMPRESSION: 1. No acute finding. There is a small right renal calculus that is nonobstructive. 2. Complex/loculated right pleural effusion that is small volume. This effusion has increased from 2020 imaging, without  evident cause. 3.  Aortic Atherosclerosis (ICD10-I70.0).  Coronary atherosclerosis. 4. 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 No growth since CT in 2020. Recommend followup by ultrasound in 1 year. This recommendation follows ACR consensus guidelines: White Paper of the ACR Incidental Findings Committee II on Vascular Findings. J Am Coll Radiol 2013; 10:789-794. Aortic aneurysm NOS (ICD10-I71.9) Electronically Signed   By: Monte Fantasia M.D.   On: 06/19/2019 04:16    Subjective: She was complaining of some arthritic pain in her knees and feet.  Denies any chest pain or shortness of breath.  Discharge Exam: Vitals:   06/22/19 0727 06/22/19 1134  BP: 123/83 101/71  Pulse: 72 81  Resp: 16 15  Temp: 97.6 F (36.4 C) 98.6 F (37 C)  SpO2: 100% 100%   Vitals:   06/21/19 1953 06/22/19 0401 06/22/19 0727 06/22/19 1134  BP: 120/70 127/75 123/83 101/71  Pulse: 85 83 72 81  Resp:   16 15  Temp: 98.5 F (36.9 C) 97.6 F (36.4 C) 97.6 F (36.4 C) 98.6 F (37 C)  TempSrc: Oral Oral Oral   SpO2: 97% 100% 100% 100%  Weight:  93.2 kg  Height:        General: Pt is alert, awake, not in acute distress Cardiovascular: RRR, S1/S2 +, no rubs, no gallops Respiratory: CTA bilaterally, no wheezing, no rhonchi Abdominal: Soft, NT, ND, bowel sounds + Extremities: Trace LE edema, no cyanosis   The results of significant diagnostics from this hospitalization (including imaging, microbiology, ancillary and laboratory) are listed below for reference.    Microbiology: Recent Results (from the past 240 hour(s))  Respiratory Panel by RT PCR (Flu A&B, Covid) - Nasopharyngeal Swab     Status: None   Collection Time: 06/19/19  1:26 AM   Specimen: Nasopharyngeal Swab  Result Value Ref Range Status   SARS Coronavirus 2 by RT PCR NEGATIVE NEGATIVE Final    Comment: (NOTE) SARS-CoV-2 target nucleic acids are NOT DETECTED. The SARS-CoV-2 RNA is generally detectable in upper  respiratoy specimens during the acute phase of infection. The lowest concentration of SARS-CoV-2 viral copies this assay can detect is 131 copies/mL. A negative result does not preclude SARS-Cov-2 infection and should not be used as the sole basis for treatment or other patient management decisions. A negative result may occur with  improper specimen collection/handling, submission of specimen other than nasopharyngeal swab, presence of viral mutation(s) within the areas targeted by this assay, and inadequate number of viral copies (<131 copies/mL). A negative result must be combined with clinical observations, patient history, and epidemiological information. The expected result is Negative. Fact Sheet for Patients:  PinkCheek.be Fact Sheet for Healthcare Providers:  GravelBags.it This test is not yet ap proved or cleared by the [place] FDA and  has been authorized for detection and/or diagnosis of SARS-CoV-2 by FDA under an Emergency Use Authorization (EUA). This EUA will remain  in effect (meaning this test can be used) for the duration of the COVID-19 declaration under Section 564(b)(1) of the Act, 21 U.S.C. section 360bbb-3(b)(1), unless the authorization is terminated or revoked sooner.    Influenza A by PCR NEGATIVE NEGATIVE Final   Influenza B by PCR NEGATIVE NEGATIVE Final    Comment: (NOTE) The Xpert Xpress SARS-CoV-2/FLU/RSV assay is intended as an aid in  the diagnosis of influenza from Nasopharyngeal swab specimens and  should not be used as a sole basis for treatment. Nasal washings and  aspirates are unacceptable for Xpert Xpress SARS-CoV-2/FLU/RSV  testing. Fact Sheet for Patients: PinkCheek.be Fact Sheet for Healthcare Providers: GravelBags.it This test is not yet approved or cleared by the [place] FDA and  has been authorized for  detection and/or diagnosis of SARS-CoV-2 by  FDA under an Emergency Use Authorization (EUA). This EUA will remain  in effect (meaning this test can be used) for the duration of the  Covid-19 declaration under Section 564(b)(1) of the Act, 21  U.S.C. section 360bbb-3(b)(1), unless the authorization is  terminated or revoked. Performed at [facility], [address] [place], [place] [address]   Blood Culture (routine x 2)     Status: None (Preliminary result)   Collection Time: 06/19/19  1:37 AM   Specimen: Left Antecubital; Blood  Result Value Ref Range Status   Specimen Description LEFT ANTECUBITAL  Final   Special Requests   Final    BOTTLES DRAWN AEROBIC AND ANAEROBIC Blood Culture adequate volume   Culture   Final    NO GROWTH 3 DAYS Performed at [facility], [address], [place], [place] [address]    Report Status PENDING  Incomplete  Blood Culture (routine x 2)  Status: None (Preliminary result)   Collection Time: 06/19/19  1:37 AM   Specimen: Right Antecubital; Blood  Result Value Ref Range Status   Specimen Description RIGHT ANTECUBITAL  Final   Special Requests   Final    BOTTLES DRAWN AEROBIC AND ANAEROBIC Blood Culture results may not be optimal due to an inadequate volume of blood received in culture bottles   Culture   Final    NO GROWTH 3 DAYS Performed at [facility], [address] [place], [place] [address]    Report Status PENDING  Incomplete  Urine culture     Status: Abnormal   Collection Time: 06/19/19  1:37 AM   Specimen: In/Out Cath Urine  Result Value Ref Range Status   Specimen Description   Final    IN/OUT CATH URINE Performed at [facility], [address], [place], [place] [address]    Special Requests   Final    NONE Performed at [facility], [place], [place] [address]    Culture 50,000 COLONIES/mL YEAST (A)  Final   Report Status 06/19/2019 FINAL  Final  MRSA PCR  Screening     Status: Abnormal   Collection Time: 06/19/19  5:07 PM   Specimen: Nasopharyngeal  Result Value Ref Range Status   MRSA by PCR POSITIVE (A) NEGATIVE Final    Comment:        The GeneXpert MRSA Assay (FDA approved for NASAL specimens only), is one component of a comprehensive MRSA colonization surveillance program. It is not intended to diagnose MRSA infection nor to guide or monitor treatment for MRSA infections. RESULT CALLED TO, READ BACK BY AND VERIFIED WITH: STEPHENS, JENNIFER AT 1922 ON 06/19/2019 BY MOSLEY,J Performed at [facility], [address] [place], [place] [address]      Labs: BNP (last 3 results) Recent Labs    02/11/19 0945 02/14/19 1109 06/19/19 0137  BNP 76.0 117.0* 564.3*   Basic Metabolic Panel: Recent Labs  Lab 06/19/19 0137 06/20/19 0448 06/21/19 0643 06/22/19 0609  NA 132* 138 136 136  K 4.1 4.4 3.7 4.3  CL 105 110 107 107  CO2 20* 23 23 24   GLUCOSE 121* 96 115* 85  BUN 21 25* 24* 21  CREATININE 1.47* 1.31* 1.39* 1.27*  CALCIUM 9.6 9.2 8.9 9.3   Liver Function Tests: Recent Labs  Lab 06/19/19 0137  AST 38  ALT 17  ALKPHOS 177*  BILITOT 0.6  PROT 7.2  ALBUMIN 2.4*   Recent Labs  Lab 06/19/19 0137  LIPASE 34   No results for input(s): AMMONIA in the last 168 hours. CBC: Recent Labs  Lab 06/19/19 0137 06/20/19 0448 06/21/19 0643 06/22/19 0609  WBC 13.6* 9.4 6.4 4.9  NEUTROABS 12.4*  --   --   --   HGB 11.3* 9.3* 8.1* 8.7*  HCT 35.3* 28.8* 24.1* 26.1*  MCV 92.7 91.7 89.6 89.1  PLT 326 262 253 269   Cardiac Enzymes: No results for input(s): CKTOTAL, CKMB, CKMBINDEX, TROPONINI in the last 168 hours. BNP: Invalid input(s): POCBNP CBG: Recent Labs  Lab 06/21/19 1209 06/21/19 1526 06/21/19 2058 06/22/19 0728 06/22/19 1136  GLUCAP 86 90 89 74 76   D-Dimer No results for input(s): DDIMER in the last 72 hours. Hgb A1c No results for input(s): HGBA1C in the last 72 hours. Lipid Profile No  results for input(s): CHOL, HDL, LDLCALC, TRIG, CHOLHDL, LDLDIRECT in the last 72 hours. Thyroid function studies No results for input(s):  TSH, T4TOTAL, T3FREE, THYROIDAB in the last 72 hours.  Invalid input(s): FREET3 Anemia work up No results for input(s): VITAMINB12, FOLATE, FERRITIN, TIBC, IRON, RETICCTPCT in the last 72 hours. Urinalysis    Component Value Date/Time   COLORURINE YELLOW (A) 06/19/2019 0137   APPEARANCEUR TURBID (A) 06/19/2019 0137   APPEARANCEUR Clear 02/20/2013 1459   LABSPEC 1.016 06/19/2019 0137   LABSPEC 1.012 02/20/2013 1459   PHURINE 5.0 06/19/2019 0137   GLUCOSEU NEGATIVE 06/19/2019 0137   GLUCOSEU Negative 02/20/2013 1459   HGBUR NEGATIVE 06/19/2019 0137   BILIRUBINUR NEGATIVE 06/19/2019 0137   BILIRUBINUR Negative 02/20/2013 1459   KETONESUR NEGATIVE 06/19/2019 0137   PROTEINUR 30 (A) 06/19/2019 0137   NITRITE NEGATIVE 06/19/2019 0137   LEUKOCYTESUR LARGE (A) 06/19/2019 0137   LEUKOCYTESUR Negative 02/20/2013 1459   Sepsis Labs Invalid input(s): PROCALCITONIN,  WBC,  LACTICIDVEN Microbiology Recent Results (from the past 240 hour(s))  Respiratory Panel by RT PCR (Flu A&B, Covid) - Nasopharyngeal Swab     Status: None   Collection Time: 06/19/19  1:26 AM   Specimen: Nasopharyngeal Swab  Result Value Ref Range Status   SARS Coronavirus 2 by RT PCR NEGATIVE NEGATIVE Final    Comment: (NOTE) SARS-CoV-2 target nucleic acids are NOT DETECTED. The SARS-CoV-2 RNA is generally detectable in upper respiratoy specimens during the acute phase of infection. The lowest concentration of SARS-CoV-2 viral copies this assay can detect is 131 copies/mL. A negative result does not preclude SARS-Cov-2 infection and should not be used as the sole basis for treatment or other patient management decisions. A negative result may occur with  improper specimen collection/handling, submission of specimen other than nasopharyngeal swab, presence of viral mutation(s)  within the areas targeted by this assay, and inadequate number of viral copies (<131 copies/mL). A negative result must be combined with clinical observations, patient history, and epidemiological information. The expected result is Negative. Fact Sheet for Patients:  PinkCheek.be Fact Sheet for Healthcare Providers:  GravelBags.it This test is not yet ap proved or cleared by the [place] FDA and  has been authorized for detection and/or diagnosis of SARS-CoV-2 by FDA under an Emergency Use Authorization (EUA). This EUA will remain  in effect (meaning this test can be used) for the duration of the COVID-19 declaration under Section 564(b)(1) of the Act, 21 U.S.C. section 360bbb-3(b)(1), unless the authorization is terminated or revoked sooner.    Influenza A by PCR NEGATIVE NEGATIVE Final   Influenza B by PCR NEGATIVE NEGATIVE Final    Comment: (NOTE) The Xpert Xpress SARS-CoV-2/FLU/RSV assay is intended as an aid in  the diagnosis of influenza from Nasopharyngeal swab specimens and  should not be used as a sole basis for treatment. Nasal washings and  aspirates are unacceptable for Xpert Xpress SARS-CoV-2/FLU/RSV  testing. Fact Sheet for Patients: PinkCheek.be Fact Sheet for Healthcare Providers: GravelBags.it This test is not yet approved or cleared by the [place] FDA and  has been authorized for detection and/or diagnosis of SARS-CoV-2 by  FDA under an Emergency Use Authorization (EUA). This EUA will remain  in effect (meaning this test can be used) for the duration of the  Covid-19 declaration under Section 564(b)(1) of the Act, 21  U.S.C. section 360bbb-3(b)(1), unless the authorization is  terminated or revoked. Performed at [facility], [address] [place], [place] [address]   Blood Culture (routine x 2)     Status: None (Preliminary  result)   Collection Time: 06/19/19  1:37 AM  Specimen: Left Antecubital; Blood  Result Value Ref Range Status   Specimen Description LEFT ANTECUBITAL  Final   Special Requests   Final    BOTTLES DRAWN AEROBIC AND ANAEROBIC Blood Culture adequate volume   Culture   Final    NO GROWTH 3 DAYS Performed at [facility], [address] [place], [place] [address]    Report Status PENDING  Incomplete  Blood Culture (routine x 2)     Status: None (Preliminary result)   Collection Time: 06/19/19  1:37 AM   Specimen: Right Antecubital; Blood  Result Value Ref Range Status   Specimen Description RIGHT ANTECUBITAL  Final   Special Requests   Final    BOTTLES DRAWN AEROBIC AND ANAEROBIC Blood Culture results may not be optimal due to an inadequate volume of blood received in culture bottles   Culture   Final    NO GROWTH 3 DAYS Performed at [facility], [address], [place], [place] [address]    Report Status PENDING  Incomplete  Urine culture     Status: Abnormal   Collection Time: 06/19/19  1:37 AM   Specimen: In/Out Cath Urine  Result Value Ref Range Status   Specimen Description   Final    IN/OUT CATH URINE Performed at [facility], [address], [place], [place] [address]    Special Requests   Final    NONE Performed at [facility], [place], [place] [address]    Culture 50,000 COLONIES/mL YEAST (A)  Final   Report Status 06/19/2019 FINAL  Final  MRSA PCR Screening     Status: Abnormal   Collection Time: 06/19/19  5:07 PM   Specimen: Nasopharyngeal  Result Value Ref Range Status   MRSA by PCR POSITIVE (A) NEGATIVE Final    Comment:        The GeneXpert MRSA Assay (FDA approved for NASAL specimens only), is one component of a comprehensive MRSA colonization surveillance program. It is not intended to diagnose MRSA infection nor to guide or monitor treatment for MRSA infections. RESULT CALLED TO, READ BACK BY  AND VERIFIED WITH: Arlina Robes AT 1922 ON 06/19/2019 BY MOSLEY,J Performed at [facility], [address] [place], [place] [address]     Time coordinating discharge: Over 30 minutes  SIGNED:  Lorella Nimrod, MD  [facility] 06/22/2019, 12:48 PM  If 7PM-7AM, please contact night-coverage [email or web address]  This record has been created using Systems analyst. Errors have been sought and corrected,but may not always be located. Such creation errors do not reflect on the standard of care.

## 2019-06-22 NOTE — Social Work (Signed)
CSW received confirmation form Kelli in admissions at  @ [place] 14 B

## 2019-06-23 DIAGNOSIS — N183 Chronic kidney disease, stage 3 unspecified: Secondary | ICD-10-CM | POA: Diagnosis not present

## 2019-06-23 DIAGNOSIS — R5381 Other malaise: Secondary | ICD-10-CM | POA: Diagnosis not present

## 2019-06-23 DIAGNOSIS — I509 Heart failure, unspecified: Secondary | ICD-10-CM | POA: Diagnosis not present

## 2019-06-24 LAB — CULTURE, BLOOD (ROUTINE X 2)
Culture: NO GROWTH
Special Requests: ADEQUATE

## 2019-07-01 DIAGNOSIS — J449 Chronic obstructive pulmonary disease, unspecified: Secondary | ICD-10-CM | POA: Diagnosis not present

## 2019-07-01 DIAGNOSIS — I509 Heart failure, unspecified: Secondary | ICD-10-CM | POA: Diagnosis not present

## 2019-07-01 DIAGNOSIS — J9611 Chronic respiratory failure with hypoxia: Secondary | ICD-10-CM | POA: Diagnosis not present

## 2019-07-01 LAB — CULTURE, BLOOD (ROUTINE X 2)

## 2019-08-06 ENCOUNTER — Other Ambulatory Visit: Payer: Self-pay

## 2019-08-06 ENCOUNTER — Encounter: Payer: Self-pay | Admitting: Nurse Practitioner

## 2019-08-06 ENCOUNTER — Non-Acute Institutional Stay: Payer: Medicare HMO | Admitting: Nurse Practitioner

## 2019-08-06 VITALS — BP 136/80 | HR 82 | Temp 98.0°F | Resp 18 | Wt 176.5 lb

## 2019-08-06 DIAGNOSIS — I509 Heart failure, unspecified: Secondary | ICD-10-CM

## 2019-08-06 DIAGNOSIS — Z515 Encounter for palliative care: Secondary | ICD-10-CM

## 2019-08-06 NOTE — Progress Notes (Signed)
[facility] [facility] Palliative Care Consult Note Telephone: [phone]  Fax: [phone]  PATIENT NAME: Katherine [name] DOB: Jan 01, 1941 MRN: [id number]  PRIMARY CARE PROVIDER:   [facility] RESPONSIBLE PARTY:Self/[name] spouse [phone]  RECOMMENDATIONS and PLAN: 1.ACP: remains a full code; most form in ACP  2.Edema secondary toCHF, monitor weights, elevate  3.Shortness of breathsecondary toCHF, continue diuresing, inhalation therapy; Continuous O2,Continue daily weights  4.Palliative care encounter; Palliative medicine team will continue to support patient, patient's family, and medical team. Visit consisted of counseling and education dealing with the complex and emotionally intense issues of symptom management and palliative care in the setting of serious and potentially life-threatening illness  I spent 60 minutes providing this consultation,  Start at 11:00am More than 50% of the time in this consultation was spent coordinating communication.   HISTORY OF PRESENT ILLNESS:  Katherine [name] is a 79 y.o. 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 Katherine [name] continues to reside in [name] at [facility] Katherine. [name] requires assistance for transferring, mobility, turning, adl's, toileting. Katherine [name] says feed herself. Appetite  is fair per staff. Hospitalized 02/11/2020 to 02/15/2019 for unresponsiveness significant for acute encephalopathy likely metabolic with exact unclear etiology CT brain reveals some small vessel ischemic disease, mental status back to baseline. Seizure disorder vimpat and Keppra. Hospitalized for 06/19/2019 to 5 /10/2019 for fever with altered mental status with work up significant for sepsis secondary to mental status back to baseline. Aki with chronic kidney disease stage lllb likely prerenal secondary to volume depletion with fever. Worsening iron deficiency anemia with no noted signs of bleeding, Eliquis was discontinued due to multiple ER visits due to epistaxis is worsening hemoglobin 8.7 upon discharge. Previously start on Eliquis on 11/2018 when admitted with PE. Staff reports no recent falls, wounds, infections. Katherine [place] has been complaining of her hands becoming more tight and a little more difficult using utensils. At present Katherine [name] is lying in bed. Katherine [name] appears to billeted, chronically ill but comfortable, no distress. No visitors present. I visited and observed Katherine [name]. We talked about purpose of palliative care visit. We talked about how Katherine [name] has been feeling today. Katherine [name] endorses her day is going okay. Katherine [name] tonight symptoms of shortness of breath. Katherine [name] endorses it is more difficult using her hands or fingers are more stiff, becoming more difficult for her to hold utensils. Katherine [name] endorses that she was told it was arthritis. We talked about the option of possibly trying some occupational therapy. Katherine [name] was open to that idea. Katherine [name] also takes Tylenol for pain. We talked about mobility. We talked about the challenges with social isolation, barriers and visiting hours. We talked about quality-of-life. We talked about medical goals of care. We talked about role of palliative care and plan of care. Discussed it present time Katherine [name] says remain stable. Will follow up in two months  if needed or sooner said she declined. Katherine [name] in agreement. Will recommend occupational therapy to help with mobility of her hands. I updated  nursing staff.  Palliative Care was asked to help to continue to address goals of care.   CODE STATUS: Full code  PPS: 30% HOSPICE ELIGIBILITY/DIAGNOSIS: TBD  PAST MEDICAL HISTORY:  Past Medical History:  Diagnosis Date  . Angioedema   . Anxiety and depression   . CHF (congestive heart failure) ([place])   . Chronic constipation   . Chronic kidney disease    stage 3  . COPD (chronic obstructive pulmonary disease) ([place])   . Diabetes mellitus without complication ([place])   . Gallstones   . GERD (gastroesophageal reflux disease)   . Hypertension   . Left ventricular hypertrophy   . Osteoarthritis   . Prurigo nodularis   . Seizures ([place])   . Stroke ([place])   . Tobacco abuse   . Vitamin D deficiency disease     SOCIAL HX:  Social History   Tobacco Use  . Smoking status: Former Smoker    Packs/day: 0.50    Years: 60.00    Pack years: 30.00    Types: Cigarettes  . Smokeless tobacco: Never Used  . Tobacco comment: quite 43mo ago-03/26/18  Substance Use Topics  . Alcohol use: No    Alcohol/week: 0.0 standard drinks    Comment: rare    ALLERGIES:  Allergies  Allergen Reactions  . Bee Venom Swelling  . Enalapril Maleate Swelling     PERTINENT MEDICATIONS:  Outpatient Encounter Medications as of 08/06/2019  Medication Sig  . acetaminophen (TYLENOL) 650 MG CR tablet Take 650 mg by mouth 3 (three) times daily.   [name] albuterol (ACCUNEB) 0.63 MG/3ML nebulizer solution Take 3 mLs by nebulization every 8 (eight) hours as needed for wheezing.   [name] atorvastatin (LIPITOR) 80 MG tablet Take 80 mg by mouth every evening.   . cetirizine (ZYRTEC) 10 MG tablet Take 10 mg by mouth daily.   . cholecalciferol (VITAMIN D) 1000 units tablet Take 1,000 Units by mouth daily.  [name] docusate sodium (COLACE) 100 MG capsule Take 100 mg by mouth 2 (two) times daily.    . ferrous sulfate 325 (65 FE) MG tablet Take 325 mg by mouth 2 (two) times daily.  . Fluticasone-Umeclidin-Vilant 100-62.5-25 MCG/INH AEPB Inhale 1 puff into the lungs daily.  [name] gabapentin (NEURONTIN) 300 MG capsule Take 300 mg by mouth at bedtime.  [name] ipratropium-albuterol (DUONEB) 0.5-2.5 (3) MG/3ML SOLN Take 3 mLs by nebulization 3 (three) times daily.  [name] lacosamide 100 MG TABS Take 1 tablet (100 mg total) by mouth 2 (two) times daily.  [name] levETIRAcetam (KEPPRA) 750 MG tablet Take 1,500 mg by mouth daily.  [name] linagliptin (TRADJENTA) 5 MG TABS tablet Take 5 mg by mouth daily.  . meclizine (ANTIVERT) 25 MG tablet Take 25 mg by mouth every 12 (twelve) hours as needed for dizziness.   . Menthol, Topical Analgesic, (BIOFREEZE) 4 % GEL Apply 1 application topically in the morning and at bedtime. To both knees   . mupirocin ointment (BACTROBAN) 2 % Place 1 application into the nose 2 (two) times daily.  . nitroGLYCERIN (NITROSTAT) 0.4 MG SL tablet Place 0.4 mg under the tongue every 5 (five) minutes as needed for chest pain.   . pantoprazole (PROTONIX) 40 MG tablet Take 1 tablet (40 mg total) by mouth daily.  . Saccharomyces boulardii (PROBIOTIC) 250 MG CAPS Take 1 capsule by mouth 2 times daily at 12 noon and 4 pm. (Patient taking differently: Take 1 capsule by mouth 2 (two) times daily. )  . Saline GEL Place 2  sprays into the nose 3 (three) times daily.   . vitamin C (VITAMIN C) 500 MG tablet Take 1 tablet (500 mg total) by mouth daily.  [name] zinc sulfate 220 (50 Zn) MG capsule Take 1 capsule (220 mg total) by mouth daily.   No facility-administered encounter medications on file as of 08/06/2019.    PHYSICAL EXAM:   General: NAD, obese, pleasant debilitated female Cardiovascular: regular rate and rhythm Pulmonary: clear ant fields Neurological: non-ambulatory  Kameran Mcneese Ihor Gully, NP

## 2019-08-14 DIAGNOSIS — A411 Sepsis due to other specified staphylococcus: Secondary | ICD-10-CM | POA: Diagnosis not present

## 2019-08-14 DIAGNOSIS — K219 Gastro-esophageal reflux disease without esophagitis: Secondary | ICD-10-CM | POA: Diagnosis not present

## 2019-08-14 DIAGNOSIS — I5032 Chronic diastolic (congestive) heart failure: Secondary | ICD-10-CM | POA: Diagnosis not present

## 2019-08-14 DIAGNOSIS — J441 Chronic obstructive pulmonary disease with (acute) exacerbation: Secondary | ICD-10-CM | POA: Diagnosis not present

## 2019-08-14 DIAGNOSIS — R1311 Dysphagia, oral phase: Secondary | ICD-10-CM | POA: Diagnosis not present

## 2019-08-15 DIAGNOSIS — K219 Gastro-esophageal reflux disease without esophagitis: Secondary | ICD-10-CM | POA: Diagnosis not present

## 2019-08-15 DIAGNOSIS — J441 Chronic obstructive pulmonary disease with (acute) exacerbation: Secondary | ICD-10-CM | POA: Diagnosis not present

## 2019-08-15 DIAGNOSIS — R1311 Dysphagia, oral phase: Secondary | ICD-10-CM | POA: Diagnosis not present

## 2019-08-15 DIAGNOSIS — A411 Sepsis due to other specified staphylococcus: Secondary | ICD-10-CM | POA: Diagnosis not present

## 2019-08-15 DIAGNOSIS — I5032 Chronic diastolic (congestive) heart failure: Secondary | ICD-10-CM | POA: Diagnosis not present

## 2019-08-18 DIAGNOSIS — A411 Sepsis due to other specified staphylococcus: Secondary | ICD-10-CM | POA: Diagnosis not present

## 2019-08-18 DIAGNOSIS — R1311 Dysphagia, oral phase: Secondary | ICD-10-CM | POA: Diagnosis not present

## 2019-08-18 DIAGNOSIS — J441 Chronic obstructive pulmonary disease with (acute) exacerbation: Secondary | ICD-10-CM | POA: Diagnosis not present

## 2019-08-18 DIAGNOSIS — I5032 Chronic diastolic (congestive) heart failure: Secondary | ICD-10-CM | POA: Diagnosis not present

## 2019-08-18 DIAGNOSIS — K219 Gastro-esophageal reflux disease without esophagitis: Secondary | ICD-10-CM | POA: Diagnosis not present

## 2019-08-19 DIAGNOSIS — R1311 Dysphagia, oral phase: Secondary | ICD-10-CM | POA: Diagnosis not present

## 2019-08-19 DIAGNOSIS — A411 Sepsis due to other specified staphylococcus: Secondary | ICD-10-CM | POA: Diagnosis not present

## 2019-08-19 DIAGNOSIS — J441 Chronic obstructive pulmonary disease with (acute) exacerbation: Secondary | ICD-10-CM | POA: Diagnosis not present

## 2019-08-19 DIAGNOSIS — K219 Gastro-esophageal reflux disease without esophagitis: Secondary | ICD-10-CM | POA: Diagnosis not present

## 2019-08-19 DIAGNOSIS — I5032 Chronic diastolic (congestive) heart failure: Secondary | ICD-10-CM | POA: Diagnosis not present

## 2019-08-20 DIAGNOSIS — A411 Sepsis due to other specified staphylococcus: Secondary | ICD-10-CM | POA: Diagnosis not present

## 2019-08-20 DIAGNOSIS — R1311 Dysphagia, oral phase: Secondary | ICD-10-CM | POA: Diagnosis not present

## 2019-08-20 DIAGNOSIS — J441 Chronic obstructive pulmonary disease with (acute) exacerbation: Secondary | ICD-10-CM | POA: Diagnosis not present

## 2019-08-20 DIAGNOSIS — K219 Gastro-esophageal reflux disease without esophagitis: Secondary | ICD-10-CM | POA: Diagnosis not present

## 2019-08-20 DIAGNOSIS — I5032 Chronic diastolic (congestive) heart failure: Secondary | ICD-10-CM | POA: Diagnosis not present

## 2019-08-21 ENCOUNTER — Other Ambulatory Visit: Payer: Self-pay

## 2019-08-21 ENCOUNTER — Emergency Department
Admission: EM | Admit: 2019-08-21 | Discharge: 2019-08-21 | Disposition: A | Discharge status: Home / Self Care | Payer: Medicare HMO | Attending: Emergency Medicine | Admitting: Emergency Medicine

## 2019-08-21 ENCOUNTER — Encounter: Payer: Self-pay | Admitting: Emergency Medicine

## 2019-08-21 DIAGNOSIS — R05 Cough: Secondary | ICD-10-CM | POA: Diagnosis not present

## 2019-08-21 DIAGNOSIS — Z87891 Personal history of nicotine dependence: Secondary | ICD-10-CM | POA: Diagnosis not present

## 2019-08-21 DIAGNOSIS — R040 Epistaxis: Secondary | ICD-10-CM | POA: Diagnosis not present

## 2019-08-21 DIAGNOSIS — J441 Chronic obstructive pulmonary disease with (acute) exacerbation: Secondary | ICD-10-CM | POA: Diagnosis not present

## 2019-08-21 DIAGNOSIS — I2510 Atherosclerotic heart disease of native coronary artery without angina pectoris: Secondary | ICD-10-CM | POA: Diagnosis not present

## 2019-08-21 DIAGNOSIS — I130 Hypertensive heart and chronic kidney disease with heart failure and stage 1 through stage 4 chronic kidney disease, or unspecified chronic kidney disease: Secondary | ICD-10-CM | POA: Diagnosis not present

## 2019-08-21 DIAGNOSIS — N1832 Chronic kidney disease, stage 3b: Secondary | ICD-10-CM | POA: Diagnosis not present

## 2019-08-21 DIAGNOSIS — R042 Hemoptysis: Secondary | ICD-10-CM | POA: Diagnosis not present

## 2019-08-21 DIAGNOSIS — R279 Unspecified lack of coordination: Secondary | ICD-10-CM | POA: Diagnosis not present

## 2019-08-21 DIAGNOSIS — Z7984 Long term (current) use of oral hypoglycemic drugs: Secondary | ICD-10-CM | POA: Diagnosis not present

## 2019-08-21 DIAGNOSIS — I5032 Chronic diastolic (congestive) heart failure: Secondary | ICD-10-CM | POA: Diagnosis not present

## 2019-08-21 DIAGNOSIS — E1129 Type 2 diabetes mellitus with other diabetic kidney complication: Secondary | ICD-10-CM | POA: Insufficient documentation

## 2019-08-21 DIAGNOSIS — R5381 Other malaise: Secondary | ICD-10-CM | POA: Diagnosis not present

## 2019-08-21 DIAGNOSIS — R1311 Dysphagia, oral phase: Secondary | ICD-10-CM | POA: Diagnosis not present

## 2019-08-21 DIAGNOSIS — Z79899 Other long term (current) drug therapy: Secondary | ICD-10-CM | POA: Diagnosis not present

## 2019-08-21 DIAGNOSIS — Z96651 Presence of right artificial knee joint: Secondary | ICD-10-CM | POA: Insufficient documentation

## 2019-08-21 DIAGNOSIS — R58 Hemorrhage, not elsewhere classified: Secondary | ICD-10-CM | POA: Diagnosis not present

## 2019-08-21 DIAGNOSIS — K219 Gastro-esophageal reflux disease without esophagitis: Secondary | ICD-10-CM | POA: Diagnosis not present

## 2019-08-21 DIAGNOSIS — Z7689 Persons encountering health services in other specified circumstances: Secondary | ICD-10-CM | POA: Diagnosis not present

## 2019-08-21 DIAGNOSIS — A411 Sepsis due to other specified staphylococcus: Secondary | ICD-10-CM | POA: Diagnosis not present

## 2019-08-21 LAB — CBC WITH DIFFERENTIAL/PLATELET
Abs Immature Granulocytes: 0.02 10*3/uL (ref 0.00–0.07)
Basophils Absolute: 0 10*3/uL (ref 0.0–0.1)
Basophils Relative: 0 %
Eosinophils Absolute: 0.3 10*3/uL (ref 0.0–0.5)
Eosinophils Relative: 5 %
HCT: 32.8 % — ABNORMAL LOW (ref 36.0–46.0)
Hemoglobin: 10.5 g/dL — ABNORMAL LOW (ref 12.0–15.0)
Immature Granulocytes: 0 %
Lymphocytes Relative: 37 %
Lymphs Abs: 2 10*3/uL (ref 0.7–4.0)
MCH: 29.6 pg (ref 26.0–34.0)
MCHC: 32 g/dL (ref 30.0–36.0)
MCV: 92.4 fL (ref 80.0–100.0)
Monocytes Absolute: 0.3 10*3/uL (ref 0.1–1.0)
Monocytes Relative: 6 %
Neutro Abs: 2.9 10*3/uL (ref 1.7–7.7)
Neutrophils Relative %: 52 %
Platelets: 279 10*3/uL (ref 150–400)
RBC: 3.55 MIL/uL — ABNORMAL LOW (ref 3.87–5.11)
RDW: 15.7 % — ABNORMAL HIGH (ref 11.5–15.5)
WBC: 5.5 10*3/uL (ref 4.0–10.5)
nRBC: 0 % (ref 0.0–0.2)

## 2019-08-21 LAB — PROTIME-INR
INR: 0.9 (ref 0.8–1.2)
Prothrombin Time: 12.2 seconds (ref 11.4–15.2)

## 2019-08-21 LAB — COMPREHENSIVE METABOLIC PANEL
ALT: 24 U/L (ref 0–44)
AST: 39 U/L (ref 15–41)
Albumin: 2.5 g/dL — ABNORMAL LOW (ref 3.5–5.0)
Alkaline Phosphatase: 158 U/L — ABNORMAL HIGH (ref 38–126)
Anion gap: 10 (ref 5–15)
BUN: 21 mg/dL (ref 8–23)
CO2: 25 mmol/L (ref 22–32)
Calcium: 9.6 mg/dL (ref 8.9–10.3)
Chloride: 105 mmol/L (ref 98–111)
Creatinine, Ser: 1.52 mg/dL — ABNORMAL HIGH (ref 0.44–1.00)
GFR calc Af Amer: 38 mL/min — ABNORMAL LOW (ref >60)
GFR calc non Af Amer: 33 mL/min — ABNORMAL LOW (ref >60)
Glucose, Bld: 114 mg/dL — ABNORMAL HIGH (ref 70–99)
Potassium: 4.1 mmol/L (ref 3.5–5.1)
Sodium: 140 mmol/L (ref 135–145)
Total Bilirubin: 0.6 mg/dL (ref 0.3–1.2)
Total Protein: 7.3 g/dL (ref 6.5–8.1)

## 2019-08-21 NOTE — Discharge Instructions (Addendum)
Follow discharge care instruction.  Continue previous medications.

## 2019-08-21 NOTE — ED Provider Notes (Addendum)
MSE was initiated and I personally evaluated the patient and placed orders (if any) at  8:21 AM on August 21, 2019.  Patient arrived via EMS with nosebleed from nursing. History of recurrent epistaxis. No active bleeding at this time.  The patient appears stable so that the remainder of the MSE may be completed by another provider.   Sable Feil, PA-C 08/21/19 0821    Sable Feil, PA-C 08/21/19 0846    Sable Feil, PA-C 08/21/19 9295    Blake Divine, MD 08/21/19 [phone]

## 2019-08-21 NOTE — ED Triage Notes (Signed)
Pt in via EMS from [facility] with c/o epistaxis. No bleeding at this time. 114/78, CBG 196, HR 90, 95%RA. Pt does not take any blood thinners.

## 2019-08-21 NOTE — ED Triage Notes (Signed)
Pt reports had a nosebleed this am. Pt states that there is nothing wrong with her and she is not sure why they made her come. Pt denies all sx's. No bleeding at this time.

## 2019-08-21 NOTE — ED Provider Notes (Signed)
[facility] Emergency Department Provider Note   ____________________________________________   First MD Initiated Contact with Patient 08/21/19 1004     (approximate)  I have reviewed the triage vital signs and the nursing notes.   HISTORY  Chief Complaint Epistaxis    HPI Katherine [name] is a 79 y.o. female patient arrived via EMS with complaint of right nare epistaxis.  Patient states there is no bleeding at this time.  Patient states that the nursing home was concerned because she was spitting up blood.  Patient arrival for cottonball inserted in the right naris.  Patient has a history recurrent nosebleed from the right nare.  Patient has been seen, evaluated and treated by ENT for recurrent nosebleeds.  Patient history is hard to obtain secondary to altered mental status.         Past Medical History:  Diagnosis Date  . Angioedema   . Anxiety and depression   . CHF (congestive heart failure) ([place])   . Chronic constipation   . Chronic kidney disease    stage 3  . COPD (chronic obstructive pulmonary disease) ([place])   . Diabetes mellitus without complication ([place])   . Gallstones   . GERD (gastroesophageal reflux disease)   . Hypertension   . Left ventricular hypertrophy   . Osteoarthritis   . Prurigo nodularis   . Seizures ([place])   . Stroke ([place])   . Tobacco abuse   . Vitamin D deficiency disease     Patient Active Problem List   Diagnosis Date Noted  . Sepsis due to gram-negative UTI ([place]) 06/19/2019  . Type II diabetes mellitus with renal manifestations ([place]) 06/19/2019  . Stroke ([place]) 06/19/2019  . AAA (abdominal aortic aneurysm) ([place]) 06/19/2019  . Acute metabolic encephalopathy 85/46/2703  . Iron deficiency anemia   . Altered mental status   . Acute encephalopathy 02/11/2019  . Sepsis secondary to UTI ([place]) 01/08/2019  . Acute renal failure superimposed on stage 3b chronic kidney disease ([place]) 01/08/2019  . Seizure disorder  ([name]) 01/08/2019  . Diabetes mellitus type 2, controlled, with complications ([place]) 50/10/3816  . Acute on chronic anemia 01/03/2019  . COVID-19 12/21/2018  . COVID-19 virus infection 12/21/2018  . Hemoptysis 12/20/2018  . Pulmonary embolism ([place]) 11/21/2018  . Delirium   . UTI (urinary tract infection) 08/21/2018  . Chest pain 07/15/2018  . Palliative care encounter 05/10/2018  . Localized edema 05/10/2018  . Shortness of breath 05/10/2018  . Hematemesis 04/28/2018  . Influenza A 04/13/2018  . Acute on chronic congestive heart failure ([place])   . COPD with acute exacerbation ([place]) 02/24/2018  . Chronic diastolic heart failure ([place]) 12/31/2017  . Lymphedema 12/31/2017  . COPD exacerbation ([place]) 11/30/2017  . Urinary tract infection 11/22/2017  . 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[place]) 11/16/2017  . Diabetes mellitus type 2, uncomplicated ([place]) 29/93/7169  . Pressure injury of skin 10/29/2017  . Acute kidney injury superimposed on CKD ([place]) 10/24/2017  . Sepsis ([place]) 10/24/2017  . Possible Seizures ([place]) 10/08/2017  . Hyperlipemia 10/08/2017  . 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 Stroke-like episode ([place]) s/p IV tpa 10/04/2017  . Palliative care by specialist   . Elevated rheumatoid factor 09/05/2017  . Frequent hospital admissions 08/22/2017  . Aphasia 06/28/2017  . Right hand pain 03/21/2017  . Dry skin 03/21/2017  . Pain in finger of left hand 09/12/2016  . Chronic fatigue 06/12/2016  . Left knee pain 06/12/2016  . Goals of care, counseling/discussion  03/13/2016  . Primary localized osteoarthritis of right knee 02/08/2016  . OSA (obstructive sleep apnea) 09/16/2015  . Rotator cuff syndrome 09/07/2015  . 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 Sleep disturbance 04/14/2015  . Coronary artery disease 03/14/2015  . 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 Acute respiratory failure with hypoxia ([place]) 09/16/2014  . 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 Anxiety     . Tobacco abuse   . Prurigo nodularis   . GERD (gastroesophageal reflux disease)   . COPD (chronic obstructive pulmonary disease) ([place])   . Osteoarthritis   . Vitamin D deficiency disease   . Chronic constipation   . CKD (chronic kidney disease), stage III   . Essential hypertension 05/20/2013  . Morbid obesity ([place]) 05/20/2013    Past Surgical History:  Procedure Laterality Date  . CHOLECYSTECTOMY    . 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   Procedure: TOTAL KNEE ARTHROPLASTY;  Surgeon: Hessie Knows, MD;  Location: [facility] ORS;  Service: Orthopedics;  Laterality: Right;    Prior to Admission medications   Medication Sig Start Date End Date Taking? Authorizing Provider  acetaminophen (TYLENOL) 650 MG CR tablet Take 650 mg by mouth 3 (three) times daily.     [provider]  albuterol (ACCUNEB) 0.63 MG/3ML nebulizer solution Take 3 mLs by nebulization every 8 (eight) hours as needed for wheezing.     [provider]  atorvastatin (LIPITOR) 80 MG tablet Take 80 mg by mouth every evening.     [provider]  cetirizine (ZYRTEC) 10 MG tablet Take 10 mg by mouth daily.     [provider]  cholecalciferol (VITAMIN D) 1000 units tablet Take 1,000 Units by mouth daily.    [provider]  docusate sodium (COLACE) 100 MG capsule Take 100 mg by mouth 2 (two) times daily.    [provider]  ferrous sulfate 325 (65 FE) MG tablet Take 325 mg by mouth 2 (two) times daily.    [provider]  Fluticasone-Umeclidin-Vilant 100-62.5-25 MCG/INH AEPB Inhale 1 puff into the lungs daily.    [provider]  gabapentin (NEURONTIN) 300 MG capsule Take 300 mg by mouth at bedtime.    [provider]  ipratropium-albuterol (DUONEB) 0.5-2.5 (3) MG/3ML SOLN Take 3 mLs by nebulization 3 (three) times daily. 07/17/18   Gladstone Lighter, MD  lacosamide 100 MG TABS Take 1 tablet (100 mg total)  by mouth 2 (two) times daily. 12/31/18   Thurnell Lose, MD  levETIRAcetam (KEPPRA) 750 MG tablet Take 1,500 mg by mouth daily.    [provider]  linagliptin (TRADJENTA) 5 MG TABS tablet Take 5 mg by mouth daily.    [provider]  meclizine (ANTIVERT) 25 MG tablet Take 25 mg by mouth every 12 (twelve) hours as needed for dizziness.     [provider]  Menthol, Topical Analgesic, (BIOFREEZE) 4 % GEL Apply 1 application topically in the morning and at bedtime. To both knees     [provider]  mupirocin ointment (BACTROBAN) 2 % Place 1 application into the nose 2 (two) times daily. 06/13/18   Loletha Grayer, MD  nitroGLYCERIN (NITROSTAT) 0.4 MG SL tablet Place 0.4 mg under the tongue every 5 (five) minutes as needed for chest pain.  08/07/18   [provider]  pantoprazole (PROTONIX) 40 MG tablet Take 1 tablet (40 mg total) by mouth daily. 04/29/18   Bettey Costa, MD  Saccharomyces boulardii (PROBIOTIC) 250 MG CAPS Take 1 capsule by mouth 2 times daily at 12 noon and 4 pm. Patient taking differently: Take 1 capsule by mouth 2 (two) times daily.  04/05/19   Merlyn Lot, MD  Saline GEL Place 2 sprays into the nose 3 (three) times daily.     [provider]  vitamin C (VITAMIN C) 500 MG tablet Take 1 tablet (500 mg total) by mouth daily. 01/12/19   Allie Bossier, MD  zinc sulfate 220 (50 Zn) MG capsule Take 1 capsule (220 mg total) by mouth daily. 01/12/19   Allie Bossier, MD    Allergies Bee venom and Enalapril maleate  Family History  Problem Relation Age of Onset  . Alcohol abuse Mother   . Sickle cell anemia Daughter   . Hypertension Son   . Cancer Neg Hx   . COPD Neg Hx   . Diabetes Neg Hx   . Heart disease Neg Hx   . Stroke Neg Hx     Social History Social History   Tobacco Use  . Smoking status: Former Smoker    Packs/day: 0.50    Years: 60.00    Pack years: 30.00    Types: Cigarettes  . Smokeless tobacco:  Never Used  . Tobacco comment: quite 41mo ago-03/26/18  Vaping Use  . Vaping Use: Never used  Substance Use Topics  . Alcohol use: No    Alcohol/week: 0.0 standard drinks    Comment: rare  . Drug use: No    Review of Systems Constitutional: No fever/chills Eyes: No visual changes. ENT: No sore throat. Cardiovascular: Denies chest pain. Respiratory: Denies shortness of breath.  COPD Gastrointestinal: No abdominal pain.  No nausea, no vomiting.  No diarrhea.  No constipation. Genitourinary: Negative for dysuria. Musculoskeletal: Negative for back pain. Skin: Negative for rash. Neurological: Negative for headaches, focal weakness or numbness. Psychiatric:   Endocrine:  Chronic liver disease, diabetes and hypertension Hematological/Lymphatic:  Anemia  Allergic/Immunilogical: Bee venom ____________________________________________   PHYSICAL EXAM:  VITAL SIGNS: ED Triage Vitals  Enc Vitals Group     BP 08/21/19 0819 119/82     Pulse Rate 08/21/19 0819 (!) 102     Resp 08/21/19 0819 (!) 21     Temp 08/21/19 0819 98.3 F (36.8 C)     Temp Source 08/21/19 0819 Oral     SpO2 08/21/19 0819 95 %     Weight 08/21/19 0820 180 lb (81.6 kg)     Height 08/21/19 0820 5' (1.524 m)     Head Circumference --      Peak Flow --      Pain Score 08/21/19 0819 0     Pain Loc --      Pain Edu? --      Excl. in [place]? --     Constitutional: Alert and oriented. Well appearing and in no acute distress. Eyes: Conjunctivae are normal. PERRL. EOMI. Head: Atraumatic. Nose: No congestion/rhinnorhea.  No active bleeding. Mouth/Throat: Mucous membranes are moist.  Oropharynx non-erythematous. Neck: No stridor.  Cardiovascular: Tachycardic, regular rhythm. Grossly normal heart sounds.  Good peripheral circulation. Respiratory: Normal respiratory effort.  No retractions. Lungs CTAB. Musculoskeletal: No lower extremity tenderness nor edema.  No joint effusions. Neurologic:  Normal speech and language.  No gross focal neurologic deficits are appreciated. No gait instability. Skin:  Skin is warm, dry and intact. No rash noted. ____________________________________________   LABS (all labs ordered are listed, but only abnormal results are  displayed)  Labs Reviewed  COMPREHENSIVE METABOLIC PANEL - Abnormal; Notable for the following components:      Result Value   Glucose, Bld 114 (*)    Creatinine, Ser 1.52 (*)    Albumin 2.5 (*)    Alkaline Phosphatase 158 (*)    GFR calc non Af Amer 33 (*)    GFR calc Af Amer 38 (*)    All other components within normal limits  CBC WITH DIFFERENTIAL/PLATELET - Abnormal; Notable for the following components:   RBC 3.55 (*)    Hemoglobin 10.5 (*)    HCT 32.8 (*)    RDW 15.7 (*)    All other components within normal limits  PROTIME-INR   ____________________________________________  EKG   ____________________________________________  RADIOLOGY  ED MD interpretation:    Official radiology report(s): No results found.  ____________________________________________   PROCEDURES  Procedure(s) performed (including Critical Care):  Procedures   ____________________________________________   INITIAL IMPRESSION / ASSESSMENT AND PLAN / ED COURSE  As part of my medical decision making, I reviewed the following data within the [place]     Patient presents via EMS for right nasal epistaxis.  Bleeding resolved prior to arrival.  Physical exam was unremarkable save for dried blood right nostril.  Patient given discharge care instruction follow-up with facility doctor.    Katherine [name] was evaluated in Emergency Department on 08/21/2019 for the symptoms described in the history of present illness. She was evaluated in the context of the global COVID-19 pandemic, which necessitated consideration that the patient might be at risk for infection with the SARS-CoV-2 virus that causes COVID-19. Institutional protocols and  algorithms that pertain to the evaluation of patients at risk for COVID-19 are in a state of rapid change based on information released by regulatory bodies including the CDC and federal and state organizations. These policies and algorithms were followed during the patient's care in the ED.       ____________________________________________   FINAL CLINICAL IMPRESSION(S) / ED DIAGNOSES  Final diagnoses:  Right-sided epistaxis     ED Discharge Orders    None       Note:  This document was prepared using Dragon voice recognition software and may include unintentional dictation errors.    Sable Feil, PA-C 08/21/19 1112    Blake Divine, MD 08/21/19 1327

## 2019-08-21 NOTE — ED Notes (Signed)
This RN contacted [facility], states they are unable to provide transportation back to facility.  Secretary notified at this time to set up EMS transportation; pt made aware of plan.

## 2019-08-21 NOTE — ED Notes (Signed)
EMS called for transport to [facility] 

## 2019-08-25 DIAGNOSIS — I509 Heart failure, unspecified: Secondary | ICD-10-CM | POA: Diagnosis not present

## 2019-08-25 DIAGNOSIS — J449 Chronic obstructive pulmonary disease, unspecified: Secondary | ICD-10-CM | POA: Diagnosis not present

## 2019-08-25 DIAGNOSIS — J9611 Chronic respiratory failure with hypoxia: Secondary | ICD-10-CM | POA: Diagnosis not present

## 2019-09-06 LAB — FUNGUS CULTURE, BLOOD: Culture: NO GROWTH

## 2019-09-17 ENCOUNTER — Non-Acute Institutional Stay: Payer: Medicare HMO | Admitting: Nurse Practitioner

## 2019-09-17 ENCOUNTER — Encounter: Payer: Self-pay | Admitting: Nurse Practitioner

## 2019-09-17 DIAGNOSIS — Z515 Encounter for palliative care: Secondary | ICD-10-CM | POA: Diagnosis not present

## 2019-09-17 DIAGNOSIS — I509 Heart failure, unspecified: Secondary | ICD-10-CM | POA: Diagnosis not present

## 2019-09-17 NOTE — Progress Notes (Signed)
[facility] [facility] Palliative Care Consult Note Telephone: [phone]  Fax: [phone]  PATIENT NAME: Katherine [name] DOB: 26-Aug-1940 MRN: [id number]  PRIMARY CARE PROVIDER:   Dr Hodges/[facility] RESPONSIBLE PARTY:Self/[name] spouse [phone]  RECOMMENDATIONS and PLAN: 1.ACP: remains a full code; most form in ACP  2.Edema secondary toCHF, monitor weights, elevate  3.Shortness of breathsecondary toCHF, continue diuresing, inhalation therapy; Continuous O2,Continue daily weights  4.Palliative care encounter; Palliative medicine team will continue to support patient, patient's family, and medical team. Visit consisted of counseling and education dealing with the complex and emotionally intense issues of symptom management and palliative care in the setting of serious and potentially life-threatening illness  5. F/u 2 months monitoring chf, attempt to decrease hospitalizations, symptoms, disease progression  I spent 60 minutes providing this consultation,  Start at 12:30pm. More than 50% of the time in this consultation was spent coordinating communication.   HISTORY OF PRESENT ILLNESS:  Katherine [name] is a 79 y.o. year old female with multiple medical problems including Diastolic congestive heart failure, left ventricular hypertrophy, aneurysm of anterior com artery, pallapa vocal cord, obstructive sleep apnea, hypoventilation syndrome secondary to obesity, late onset CVA, seizure disorder, COPD, pulmonary scarring,lymphedema, diabetes, chronic kidney disease, hypertension, osteoarthritis, gerd, gallstones, rotator cuff syndrome, lymphedema, lichens simplex chronicus , history of angioedema, anxiety, depression, vitamin D deficiency, right total knee arthroplasty, polypectomy, cholecystectomy, tobacco.Katherine [name] continues to reside at [place] [facility]. Katherine [name] does  remain bed-bound and chooses not to get out of bed per staff. Katherine [name] does require assistance for transfers, mobility and turning in bed. Katherine [name] does require assistance for adl's, toileting. Katherine [name] is able to feed herself with appetite is good. No recent falls, wounds, infections, hospitalizations. Care plan meeting held 09/16/2019 with no new changes to goals of care. Staff endorses new new changes or concerns. At present Katherine [name] is lying in bed watching TV. Katherine [name] appears obese, comfortable. No visitors present. I visited and observed Katherine [name]. We talked about purpose of palliative care visit. Katherine [name] an agreement. We talked about how Katherine [name] has been feeling. Katherine [name] endorses that she has been doing a little better with her breathing. Katherine. [name] is on continuous oxygen. We talked about symptoms of pain but she does have in her hands at times but that seems to be a little better. Katherine. [name] hands do have some contractures. We talked about mobility and getting out of bed would she declined. Katherine [name] endorses that she just prefer to stay in bed. We talked about challenges residing at skilled facility. We talked about more activities including visiting. We talked about medical goals of care. We talked about full code and aggressive interventions. We talked about chronic disease progression, importance of mobility. High risk of hospitalization due to immobility and chronic disease progression. We talked about role of palliative care and plan of care. Katherine [name] today does clinically up here better than she has the last several palliative care visit. She is speaking in full sentences without any distress or exertion. We talked about follow-up visit in 2 months if needed or so should she decline. Katherine. [name] in agreement. Katherine [name] has her own responsible party. Emotional support provided. I have updated nursing staff and any changes to current goals or plan of care.  Palliative Care was asked to help to continue to  address goals of care.   CODE  STATUS: full code  PPS: 30% HOSPICE ELIGIBILITY/DIAGNOSIS: TBD  PAST MEDICAL HISTORY:  Past Medical History:  Diagnosis Date  . Angioedema   . Anxiety and depression   . CHF (congestive heart failure) ([place])   . Chronic constipation   . Chronic kidney disease    stage 3  . COPD (chronic obstructive pulmonary disease) ([place])   . Diabetes mellitus without complication ([place])   . Gallstones   . GERD (gastroesophageal reflux disease)   . Hypertension   . Left ventricular hypertrophy   . Osteoarthritis   . Prurigo nodularis   . Seizures ([place])   . Stroke ([place])   . Tobacco abuse   . Vitamin D deficiency disease     SOCIAL HX:  Social History   Tobacco Use  . Smoking status: Former Smoker    Packs/day: 0.50    Years: 60.00    Pack years: 30.00    Types: Cigarettes  . Smokeless tobacco: Never Used  . Tobacco comment: quite 80mo ago-03/26/18  Substance Use Topics  . Alcohol use: No    Alcohol/week: 0.0 standard drinks    Comment: rare    ALLERGIES:  Allergies  Allergen Reactions  . Bee Venom Swelling  . Enalapril Maleate Swelling     PERTINENT MEDICATIONS:  Outpatient Encounter Medications as of 09/17/2019  Medication Sig  . acetaminophen (TYLENOL) 650 MG CR tablet Take 650 mg by mouth 3 (three) times daily.   [name] albuterol (ACCUNEB) 0.63 MG/3ML nebulizer solution Take 3 mLs by nebulization every 8 (eight) hours as needed for wheezing.   [name] atorvastatin (LIPITOR) 80 MG tablet Take 80 mg by mouth every evening.   . cetirizine (ZYRTEC) 10 MG tablet Take 10 mg by mouth daily.   . cholecalciferol (VITAMIN D) 1000 units tablet Take 1,000 Units by mouth daily.  [name] docusate sodium (COLACE) 100 MG capsule Take 100 mg by mouth 2 (two) times daily.  . ferrous sulfate 325 (65 FE) MG tablet Take 325 mg by mouth 2 (two) times daily.  . Fluticasone-Umeclidin-Vilant 100-62.5-25 MCG/INH AEPB Inhale 1 puff into the lungs daily.  [name] gabapentin (NEURONTIN) 300 MG  capsule Take 300 mg by mouth at bedtime.  [name] ipratropium-albuterol (DUONEB) 0.5-2.5 (3) MG/3ML SOLN Take 3 mLs by nebulization 3 (three) times daily.  [name] lacosamide 100 MG TABS Take 1 tablet (100 mg total) by mouth 2 (two) times daily.  [name] levETIRAcetam (KEPPRA) 750 MG tablet Take 1,500 mg by mouth daily.  [name] linagliptin (TRADJENTA) 5 MG TABS tablet Take 5 mg by mouth daily.  . meclizine (ANTIVERT) 25 MG tablet Take 25 mg by mouth every 12 (twelve) hours as needed for dizziness.   . Menthol, Topical Analgesic, (BIOFREEZE) 4 % GEL Apply 1 application topically in the morning and at bedtime. To both knees   . mupirocin ointment (BACTROBAN) 2 % Place 1 application into the nose 2 (two) times daily.  . nitroGLYCERIN (NITROSTAT) 0.4 MG SL tablet Place 0.4 mg under the tongue every 5 (five) minutes as needed for chest pain.   . pantoprazole (PROTONIX) 40 MG tablet Take 1 tablet (40 mg total) by mouth daily.  . Saccharomyces boulardii (PROBIOTIC) 250 MG CAPS Take 1 capsule by mouth 2 times daily at 12 noon and 4 pm. (Patient taking differently: Take 1 capsule by mouth 2 (two) times daily. )  . Saline GEL Place 2 sprays into the nose 3 (three) times daily.   . vitamin C (VITAMIN C) 500 MG tablet Take 1  tablet (500 mg total) by mouth daily.  [name] zinc sulfate 220 (50 Zn) MG capsule Take 1 capsule (220 mg total) by mouth daily.   No facility-administered encounter medications on file as of 09/17/2019.    PHYSICAL EXAM:   General: obese, debilitated, appears chronically ill, O2 dependent female Cardiovascular: regular rate and rhythm Pulmonary: clear ant fields; poor air movements Neurological: functionally quadriplegic  Juergen Hardenbrook Ihor Gully, NP

## 2019-09-18 ENCOUNTER — Other Ambulatory Visit: Payer: Self-pay

## 2019-09-24 ENCOUNTER — Other Ambulatory Visit: Payer: Self-pay

## 2019-09-24 ENCOUNTER — Emergency Department: Payer: Medicare HMO

## 2019-09-24 ENCOUNTER — Inpatient Hospital Stay
Admission: EM | Admit: 2019-09-24 | Discharge: 2019-09-28 | DRG: 871 | Disposition: A | Discharge status: Skilled Nursing Facility | Payer: Medicare HMO | Source: Skilled Nursing Facility | Attending: Internal Medicine | Admitting: Internal Medicine

## 2019-09-24 DIAGNOSIS — A4151 Sepsis due to Escherichia coli [E. coli]: Secondary | ICD-10-CM | POA: Diagnosis present

## 2019-09-24 DIAGNOSIS — G40909 Epilepsy, unspecified, not intractable, without status epilepticus: Secondary | ICD-10-CM | POA: Diagnosis present

## 2019-09-24 DIAGNOSIS — E785 Hyperlipidemia, unspecified: Secondary | ICD-10-CM | POA: Diagnosis not present

## 2019-09-24 DIAGNOSIS — N3001 Acute cystitis with hematuria: Secondary | ICD-10-CM | POA: Diagnosis not present

## 2019-09-24 DIAGNOSIS — E559 Vitamin D deficiency, unspecified: Secondary | ICD-10-CM | POA: Diagnosis present

## 2019-09-24 DIAGNOSIS — N179 Acute kidney failure, unspecified: Secondary | ICD-10-CM | POA: Diagnosis not present

## 2019-09-24 DIAGNOSIS — Z20822 Contact with and (suspected) exposure to covid-19: Secondary | ICD-10-CM | POA: Diagnosis present

## 2019-09-24 DIAGNOSIS — Z87891 Personal history of nicotine dependence: Secondary | ICD-10-CM | POA: Diagnosis not present

## 2019-09-24 DIAGNOSIS — Z888 Allergy status to other drugs, medicaments and biological substances status: Secondary | ICD-10-CM

## 2019-09-24 DIAGNOSIS — I5032 Chronic diastolic (congestive) heart failure: Secondary | ICD-10-CM | POA: Diagnosis not present

## 2019-09-24 DIAGNOSIS — Z87440 Personal history of urinary (tract) infections: Secondary | ICD-10-CM

## 2019-09-24 DIAGNOSIS — D631 Anemia in chronic kidney disease: Secondary | ICD-10-CM | POA: Diagnosis present

## 2019-09-24 DIAGNOSIS — K219 Gastro-esophageal reflux disease without esophagitis: Secondary | ICD-10-CM | POA: Diagnosis present

## 2019-09-24 DIAGNOSIS — R0689 Other abnormalities of breathing: Secondary | ICD-10-CM | POA: Diagnosis not present

## 2019-09-24 DIAGNOSIS — Z1619 Resistance to other specified beta lactam antibiotics: Secondary | ICD-10-CM | POA: Diagnosis present

## 2019-09-24 DIAGNOSIS — A4159 Other Gram-negative sepsis: Secondary | ICD-10-CM | POA: Diagnosis not present

## 2019-09-24 DIAGNOSIS — Z811 Family history of alcohol abuse and dependence: Secondary | ICD-10-CM | POA: Diagnosis not present

## 2019-09-24 DIAGNOSIS — Z1612 Extended spectrum beta lactamase (ESBL) resistance: Secondary | ICD-10-CM | POA: Diagnosis not present

## 2019-09-24 DIAGNOSIS — M2550 Pain in unspecified joint: Secondary | ICD-10-CM | POA: Diagnosis not present

## 2019-09-24 DIAGNOSIS — N1831 Chronic kidney disease, stage 3a: Secondary | ICD-10-CM | POA: Diagnosis present

## 2019-09-24 DIAGNOSIS — J9611 Chronic respiratory failure with hypoxia: Secondary | ICD-10-CM | POA: Diagnosis present

## 2019-09-24 DIAGNOSIS — I444 Left anterior fascicular block: Secondary | ICD-10-CM | POA: Diagnosis present

## 2019-09-24 DIAGNOSIS — N390 Urinary tract infection, site not specified: Secondary | ICD-10-CM | POA: Diagnosis present

## 2019-09-24 DIAGNOSIS — G9341 Metabolic encephalopathy: Secondary | ICD-10-CM | POA: Diagnosis not present

## 2019-09-24 DIAGNOSIS — A419 Sepsis, unspecified organism: Secondary | ICD-10-CM | POA: Diagnosis not present

## 2019-09-24 DIAGNOSIS — G9340 Encephalopathy, unspecified: Secondary | ICD-10-CM | POA: Diagnosis not present

## 2019-09-24 DIAGNOSIS — Z91030 Bee allergy status: Secondary | ICD-10-CM

## 2019-09-24 DIAGNOSIS — Z8673 Personal history of transient ischemic attack (TIA), and cerebral infarction without residual deficits: Secondary | ICD-10-CM | POA: Diagnosis not present

## 2019-09-24 DIAGNOSIS — E872 Acidosis: Secondary | ICD-10-CM | POA: Diagnosis present

## 2019-09-24 DIAGNOSIS — I130 Hypertensive heart and chronic kidney disease with heart failure and stage 1 through stage 4 chronic kidney disease, or unspecified chronic kidney disease: Secondary | ICD-10-CM | POA: Diagnosis present

## 2019-09-24 DIAGNOSIS — E1122 Type 2 diabetes mellitus with diabetic chronic kidney disease: Secondary | ICD-10-CM | POA: Diagnosis present

## 2019-09-24 DIAGNOSIS — Z96651 Presence of right artificial knee joint: Secondary | ICD-10-CM | POA: Diagnosis present

## 2019-09-24 DIAGNOSIS — R6520 Severe sepsis without septic shock: Secondary | ICD-10-CM | POA: Diagnosis not present

## 2019-09-24 DIAGNOSIS — Z8619 Personal history of other infectious and parasitic diseases: Secondary | ICD-10-CM

## 2019-09-24 DIAGNOSIS — R4182 Altered mental status, unspecified: Secondary | ICD-10-CM | POA: Diagnosis not present

## 2019-09-24 DIAGNOSIS — Z79899 Other long term (current) drug therapy: Secondary | ICD-10-CM

## 2019-09-24 DIAGNOSIS — J449 Chronic obstructive pulmonary disease, unspecified: Secondary | ICD-10-CM | POA: Diagnosis present

## 2019-09-24 DIAGNOSIS — Z8249 Family history of ischemic heart disease and other diseases of the circulatory system: Secondary | ICD-10-CM

## 2019-09-24 DIAGNOSIS — R000 Tachycardia, unspecified: Secondary | ICD-10-CM | POA: Diagnosis not present

## 2019-09-24 DIAGNOSIS — R404 Transient alteration of awareness: Secondary | ICD-10-CM | POA: Diagnosis not present

## 2019-09-24 DIAGNOSIS — Z7401 Bed confinement status: Secondary | ICD-10-CM | POA: Diagnosis not present

## 2019-09-24 DIAGNOSIS — Z832 Family history of diseases of the blood and blood-forming organs and certain disorders involving the immune mechanism: Secondary | ICD-10-CM

## 2019-09-24 DIAGNOSIS — A4150 Gram-negative sepsis, unspecified: Secondary | ICD-10-CM | POA: Diagnosis not present

## 2019-09-24 MED ORDER — SODIUM CHLORIDE 0.9 % IV BOLUS
1000.0000 mL | Freq: Once | INTRAVENOUS | Status: AC
  Administered 2019-09-25: 1000 mL via INTRAVENOUS

## 2019-09-24 NOTE — ED Triage Notes (Signed)
Pt arrives [facility] from [facility] w cc of AMS and fever. Pt has been more sleepy today per staff. Fever was 103.6 then tylenol suppository brought to 101.6 BS 104 RR 40 97/62->107/68 HR 117 ST 20 R hand hx CHF  AMS normally and wears 3L [place]  Pt intermittent unresponsive and yelling "my left leg hurts."

## 2019-09-25 ENCOUNTER — Emergency Department: Payer: Medicare HMO

## 2019-09-25 ENCOUNTER — Other Ambulatory Visit: Payer: Self-pay

## 2019-09-25 DIAGNOSIS — A4150 Gram-negative sepsis, unspecified: Secondary | ICD-10-CM

## 2019-09-25 DIAGNOSIS — N390 Urinary tract infection, site not specified: Secondary | ICD-10-CM | POA: Diagnosis not present

## 2019-09-25 DIAGNOSIS — N1831 Chronic kidney disease, stage 3a: Secondary | ICD-10-CM | POA: Diagnosis present

## 2019-09-25 DIAGNOSIS — E785 Hyperlipidemia, unspecified: Secondary | ICD-10-CM | POA: Diagnosis present

## 2019-09-25 DIAGNOSIS — R6520 Severe sepsis without septic shock: Secondary | ICD-10-CM | POA: Diagnosis not present

## 2019-09-25 DIAGNOSIS — A419 Sepsis, unspecified organism: Secondary | ICD-10-CM

## 2019-09-25 DIAGNOSIS — G40909 Epilepsy, unspecified, not intractable, without status epilepticus: Secondary | ICD-10-CM | POA: Diagnosis present

## 2019-09-25 DIAGNOSIS — E872 Acidosis: Secondary | ICD-10-CM | POA: Diagnosis present

## 2019-09-25 DIAGNOSIS — G9341 Metabolic encephalopathy: Secondary | ICD-10-CM | POA: Diagnosis present

## 2019-09-25 DIAGNOSIS — G9340 Encephalopathy, unspecified: Secondary | ICD-10-CM

## 2019-09-25 DIAGNOSIS — Z87440 Personal history of urinary (tract) infections: Secondary | ICD-10-CM | POA: Diagnosis not present

## 2019-09-25 DIAGNOSIS — A4151 Sepsis due to Escherichia coli [E. coli]: Secondary | ICD-10-CM | POA: Diagnosis present

## 2019-09-25 DIAGNOSIS — A4159 Other Gram-negative sepsis: Secondary | ICD-10-CM | POA: Diagnosis present

## 2019-09-25 DIAGNOSIS — Z20822 Contact with and (suspected) exposure to covid-19: Secondary | ICD-10-CM | POA: Diagnosis present

## 2019-09-25 DIAGNOSIS — Z1612 Extended spectrum beta lactamase (ESBL) resistance: Secondary | ICD-10-CM | POA: Diagnosis present

## 2019-09-25 DIAGNOSIS — D631 Anemia in chronic kidney disease: Secondary | ICD-10-CM | POA: Diagnosis present

## 2019-09-25 DIAGNOSIS — Z1619 Resistance to other specified beta lactam antibiotics: Secondary | ICD-10-CM | POA: Diagnosis present

## 2019-09-25 DIAGNOSIS — I130 Hypertensive heart and chronic kidney disease with heart failure and stage 1 through stage 4 chronic kidney disease, or unspecified chronic kidney disease: Secondary | ICD-10-CM | POA: Diagnosis present

## 2019-09-25 DIAGNOSIS — E559 Vitamin D deficiency, unspecified: Secondary | ICD-10-CM | POA: Diagnosis present

## 2019-09-25 DIAGNOSIS — J9611 Chronic respiratory failure with hypoxia: Secondary | ICD-10-CM | POA: Diagnosis present

## 2019-09-25 DIAGNOSIS — E1122 Type 2 diabetes mellitus with diabetic chronic kidney disease: Secondary | ICD-10-CM | POA: Diagnosis present

## 2019-09-25 DIAGNOSIS — N179 Acute kidney failure, unspecified: Secondary | ICD-10-CM | POA: Diagnosis present

## 2019-09-25 DIAGNOSIS — Z8673 Personal history of transient ischemic attack (TIA), and cerebral infarction without residual deficits: Secondary | ICD-10-CM | POA: Diagnosis not present

## 2019-09-25 DIAGNOSIS — I5032 Chronic diastolic (congestive) heart failure: Secondary | ICD-10-CM | POA: Diagnosis present

## 2019-09-25 DIAGNOSIS — J449 Chronic obstructive pulmonary disease, unspecified: Secondary | ICD-10-CM | POA: Diagnosis present

## 2019-09-25 DIAGNOSIS — Z87891 Personal history of nicotine dependence: Secondary | ICD-10-CM | POA: Diagnosis not present

## 2019-09-25 DIAGNOSIS — K219 Gastro-esophageal reflux disease without esophagitis: Secondary | ICD-10-CM | POA: Diagnosis present

## 2019-09-25 DIAGNOSIS — N3001 Acute cystitis with hematuria: Secondary | ICD-10-CM | POA: Diagnosis present

## 2019-09-25 DIAGNOSIS — Z811 Family history of alcohol abuse and dependence: Secondary | ICD-10-CM | POA: Diagnosis not present

## 2019-09-25 LAB — URINALYSIS, COMPLETE (UACMP) WITH MICROSCOPIC
Bilirubin Urine: NEGATIVE
Glucose, UA: NEGATIVE mg/dL
Ketones, ur: NEGATIVE mg/dL
Nitrite: NEGATIVE
Protein, ur: 30 mg/dL — AB
Specific Gravity, Urine: 1.013 (ref 1.005–1.030)
Squamous Epithelial / HPF: NONE SEEN (ref 0–5)
WBC, UA: >50 WBC/hpf — ABNORMAL HIGH (ref 0–5)
pH: 5 (ref 5.0–8.0)

## 2019-09-25 LAB — COMPREHENSIVE METABOLIC PANEL
ALT: 25 U/L (ref 0–44)
AST: 37 U/L (ref 15–41)
Albumin: 2.5 g/dL — ABNORMAL LOW (ref 3.5–5.0)
Alkaline Phosphatase: 147 U/L — ABNORMAL HIGH (ref 38–126)
Anion gap: 9 (ref 5–15)
BUN: 30 mg/dL — ABNORMAL HIGH (ref 8–23)
CO2: 26 mmol/L (ref 22–32)
Calcium: 10.1 mg/dL (ref 8.9–10.3)
Chloride: 103 mmol/L (ref 98–111)
Creatinine, Ser: 1.82 mg/dL — ABNORMAL HIGH (ref 0.44–1.00)
GFR calc Af Amer: 31 mL/min — ABNORMAL LOW (ref >60)
GFR calc non Af Amer: 26 mL/min — ABNORMAL LOW (ref >60)
Glucose, Bld: 101 mg/dL — ABNORMAL HIGH (ref 70–99)
Potassium: 4.9 mmol/L (ref 3.5–5.1)
Sodium: 138 mmol/L (ref 135–145)
Total Bilirubin: 0.8 mg/dL (ref 0.3–1.2)
Total Protein: 7.3 g/dL (ref 6.5–8.1)

## 2019-09-25 LAB — LACTIC ACID, PLASMA
Lactic Acid, Venous: 1 mmol/L (ref 0.5–1.9)
Lactic Acid, Venous: 1.4 mmol/L (ref 0.5–1.9)
Lactic Acid, Venous: 2.4 mmol/L (ref 0.5–1.9)

## 2019-09-25 LAB — CBC WITH DIFFERENTIAL/PLATELET
Abs Immature Granulocytes: 0.04 10*3/uL (ref 0.00–0.07)
Basophils Absolute: 0 10*3/uL (ref 0.0–0.1)
Basophils Relative: 0 %
Eosinophils Absolute: 0 10*3/uL (ref 0.0–0.5)
Eosinophils Relative: 0 %
HCT: 33.8 % — ABNORMAL LOW (ref 36.0–46.0)
Hemoglobin: 10.7 g/dL — ABNORMAL LOW (ref 12.0–15.0)
Immature Granulocytes: 0 %
Lymphocytes Relative: 16 %
Lymphs Abs: 1.8 10*3/uL (ref 0.7–4.0)
MCH: 29.2 pg (ref 26.0–34.0)
MCHC: 31.7 g/dL (ref 30.0–36.0)
MCV: 92.1 fL (ref 80.0–100.0)
Monocytes Absolute: 0.6 10*3/uL (ref 0.1–1.0)
Monocytes Relative: 6 %
Neutro Abs: 8.8 10*3/uL — ABNORMAL HIGH (ref 1.7–7.7)
Neutrophils Relative %: 78 %
Platelets: 225 10*3/uL (ref 150–400)
RBC: 3.67 MIL/uL — ABNORMAL LOW (ref 3.87–5.11)
RDW: 14.6 % (ref 11.5–15.5)
WBC: 11.3 10*3/uL — ABNORMAL HIGH (ref 4.0–10.5)
nRBC: 0 % (ref 0.0–0.2)

## 2019-09-25 LAB — PROTIME-INR
INR: 1.1 (ref 0.8–1.2)
INR: 1.1 (ref 0.8–1.2)
Prothrombin Time: 13.5 seconds (ref 11.4–15.2)
Prothrombin Time: 13.6 seconds (ref 11.4–15.2)

## 2019-09-25 LAB — BASIC METABOLIC PANEL
Anion gap: 8 (ref 5–15)
BUN: 29 mg/dL — ABNORMAL HIGH (ref 8–23)
CO2: 26 mmol/L (ref 22–32)
Calcium: 9.9 mg/dL (ref 8.9–10.3)
Chloride: 106 mmol/L (ref 98–111)
Creatinine, Ser: 1.76 mg/dL — ABNORMAL HIGH (ref 0.44–1.00)
GFR calc Af Amer: 32 mL/min — ABNORMAL LOW (ref >60)
GFR calc non Af Amer: 27 mL/min — ABNORMAL LOW (ref >60)
Glucose, Bld: 95 mg/dL (ref 70–99)
Potassium: 4.7 mmol/L (ref 3.5–5.1)
Sodium: 140 mmol/L (ref 135–145)

## 2019-09-25 LAB — MRSA PCR SCREENING: MRSA by PCR: POSITIVE — AB

## 2019-09-25 LAB — APTT: aPTT: 34 seconds (ref 24–36)

## 2019-09-25 LAB — CBC
HCT: 25.6 % — ABNORMAL LOW (ref 36.0–46.0)
Hemoglobin: 8.5 g/dL — ABNORMAL LOW (ref 12.0–15.0)
MCH: 30 pg (ref 26.0–34.0)
MCHC: 33.2 g/dL (ref 30.0–36.0)
MCV: 90.5 fL (ref 80.0–100.0)
Platelets: 238 10*3/uL (ref 150–400)
RBC: 2.83 MIL/uL — ABNORMAL LOW (ref 3.87–5.11)
RDW: 14.7 % (ref 11.5–15.5)
WBC: 13.3 10*3/uL — ABNORMAL HIGH (ref 4.0–10.5)
nRBC: 0 % (ref 0.0–0.2)

## 2019-09-25 LAB — GLUCOSE, CAPILLARY
Glucose-Capillary: 77 mg/dL (ref 70–99)
Glucose-Capillary: 86 mg/dL (ref 70–99)

## 2019-09-25 LAB — RESP PANEL BY RT PCR (RSV, FLU A&B, COVID)
Influenza A by PCR: NEGATIVE
Influenza B by PCR: NEGATIVE
Respiratory Syncytial Virus by PCR: NEGATIVE
SARS Coronavirus 2 by RT PCR: NEGATIVE

## 2019-09-25 LAB — PROCALCITONIN
Procalcitonin: 0.19 ng/mL
Procalcitonin: 0.19 ng/mL

## 2019-09-25 LAB — CORTISOL-AM, BLOOD: Cortisol - AM: 11.4 ug/dL (ref 6.7–22.6)

## 2019-09-25 MED ORDER — PANTOPRAZOLE SODIUM 40 MG PO TBEC
40.0000 mg | DELAYED_RELEASE_TABLET | Freq: Every day | ORAL | Status: DC
  Administered 2019-09-25 – 2019-09-28 (×4): 40 mg via ORAL
  Filled 2019-09-25 (×4): qty 1

## 2019-09-25 MED ORDER — LINAGLIPTIN 5 MG PO TABS
5.0000 mg | ORAL_TABLET | Freq: Every day | ORAL | Status: DC
  Administered 2019-09-25 – 2019-09-26 (×2): 5 mg via ORAL
  Filled 2019-09-25 (×6): qty 1

## 2019-09-25 MED ORDER — INSULIN ASPART 100 UNIT/ML ~~LOC~~ SOLN
0.0000 [IU] | Freq: Three times a day (TID) | SUBCUTANEOUS | Status: DC
  Filled 2019-09-25: qty 1

## 2019-09-25 MED ORDER — VITAMIN D 25 MCG (1000 UNIT) PO TABS
1000.0000 [IU] | ORAL_TABLET | Freq: Every day | ORAL | Status: DC
  Administered 2019-09-25 – 2019-09-28 (×4): 1000 [IU] via ORAL
  Filled 2019-09-25 (×4): qty 1

## 2019-09-25 MED ORDER — VANCOMYCIN HCL IN DEXTROSE 1-5 GM/200ML-% IV SOLN
1000.0000 mg | Freq: Once | INTRAVENOUS | Status: AC
  Administered 2019-09-25: 1000 mg via INTRAVENOUS
  Filled 2019-09-25: qty 200

## 2019-09-25 MED ORDER — SODIUM CHLORIDE 0.9 % IV SOLN
1.0000 g | INTRAVENOUS | Status: DC
  Administered 2019-09-25 – 2019-09-26 (×2): 1 g via INTRAVENOUS
  Filled 2019-09-25: qty 10
  Filled 2019-09-25: qty 1

## 2019-09-25 MED ORDER — SODIUM CHLORIDE 0.9 % IV SOLN
1.0000 g | Freq: Once | INTRAVENOUS | Status: AC
  Administered 2019-09-25: 1 g via INTRAVENOUS
  Filled 2019-09-25: qty 1

## 2019-09-25 MED ORDER — RISAQUAD PO CAPS
1.0000 | ORAL_CAPSULE | Freq: Two times a day (BID) | ORAL | Status: DC
  Administered 2019-09-25 – 2019-09-28 (×7): 1 via ORAL
  Filled 2019-09-25 (×10): qty 1

## 2019-09-25 MED ORDER — NITROGLYCERIN 0.4 MG SL SUBL: 0.4000 mg | SUBLINGUAL_TABLET | SUBLINGUAL | Status: DC | PRN

## 2019-09-25 MED ORDER — ZINC SULFATE 220 (50 ZN) MG PO CAPS
220.0000 mg | ORAL_CAPSULE | Freq: Every day | ORAL | Status: DC
  Administered 2019-09-25 – 2019-09-28 (×4): 220 mg via ORAL
  Filled 2019-09-25 (×4): qty 1

## 2019-09-25 MED ORDER — LACTATED RINGERS IV BOLUS
1000.0000 mL | Freq: Once | INTRAVENOUS | Status: AC
  Administered 2019-09-25: 1000 mL via INTRAVENOUS

## 2019-09-25 MED ORDER — DOCUSATE SODIUM 100 MG PO CAPS
100.0000 mg | ORAL_CAPSULE | Freq: Two times a day (BID) | ORAL | Status: DC
  Administered 2019-09-25 – 2019-09-28 (×2): 100 mg via ORAL
  Filled 2019-09-25 (×5): qty 1

## 2019-09-25 MED ORDER — MECLIZINE HCL 25 MG PO TABS
25.0000 mg | ORAL_TABLET | Freq: Two times a day (BID) | ORAL | Status: DC | PRN
  Filled 2019-09-25: qty 1

## 2019-09-25 MED ORDER — ACETAMINOPHEN 325 MG PO TABS
ORAL_TABLET | ORAL | Status: AC
  Administered 2019-09-25: 650 mg via ORAL
  Filled 2019-09-25: qty 2

## 2019-09-25 MED ORDER — ACETAMINOPHEN 650 MG RE SUPP: 325.0000 mg | Freq: Once | RECTAL | Status: DC

## 2019-09-25 MED ORDER — SODIUM CHLORIDE 0.9 % IV SOLN: INTRAVENOUS | Status: DC

## 2019-09-25 MED ORDER — ATORVASTATIN CALCIUM 20 MG PO TABS
80.0000 mg | ORAL_TABLET | Freq: Every evening | ORAL | Status: DC
  Administered 2019-09-25 – 2019-09-27 (×3): 80 mg via ORAL
  Filled 2019-09-25 (×3): qty 4

## 2019-09-25 MED ORDER — LEVETIRACETAM 750 MG PO TABS: 750.0000 mg | ORAL_TABLET | Freq: Every day | ORAL | Status: DC

## 2019-09-25 MED ORDER — INSULIN ASPART 100 UNIT/ML ~~LOC~~ SOLN: 0.0000 [IU] | Freq: Every day | SUBCUTANEOUS | Status: DC

## 2019-09-25 MED ORDER — LEVETIRACETAM 750 MG PO TABS
750.0000 mg | ORAL_TABLET | Freq: Every day | ORAL | Status: DC
  Administered 2019-09-25 – 2019-09-28 (×4): 750 mg via ORAL
  Filled 2019-09-25 (×5): qty 1

## 2019-09-25 MED ORDER — ASCORBIC ACID 500 MG PO TABS
500.0000 mg | ORAL_TABLET | Freq: Every day | ORAL | Status: DC
  Administered 2019-09-25 – 2019-09-28 (×4): 500 mg via ORAL
  Filled 2019-09-25 (×4): qty 1

## 2019-09-25 MED ORDER — ONDANSETRON HCL 4 MG/2ML IJ SOLN: 4.0000 mg | Freq: Four times a day (QID) | INTRAMUSCULAR | Status: DC | PRN

## 2019-09-25 MED ORDER — LACTATED RINGERS IV SOLN: INTRAVENOUS | Status: DC

## 2019-09-25 MED ORDER — LEVETIRACETAM 500 MG PO TABS: 1500.0000 mg | ORAL_TABLET | Freq: Every day | ORAL | Status: DC

## 2019-09-25 MED ORDER — ONDANSETRON HCL 4 MG PO TABS: 4.0000 mg | ORAL_TABLET | Freq: Four times a day (QID) | ORAL | Status: DC | PRN

## 2019-09-25 MED ORDER — ACETAMINOPHEN 325 MG PO TABS: 650.0000 mg | ORAL_TABLET | Freq: Four times a day (QID) | ORAL | Status: DC | PRN

## 2019-09-25 MED ORDER — GABAPENTIN 300 MG PO CAPS
300.0000 mg | ORAL_CAPSULE | Freq: Every day | ORAL | Status: DC
  Administered 2019-09-25 – 2019-09-27 (×3): 300 mg via ORAL
  Filled 2019-09-25 (×3): qty 1

## 2019-09-25 MED ORDER — MAGNESIUM HYDROXIDE 400 MG/5ML PO SUSP
30.0000 mL | Freq: Every day | ORAL | Status: DC | PRN
  Filled 2019-09-25: qty 30

## 2019-09-25 MED ORDER — ACETAMINOPHEN 650 MG RE SUPP: 650.0000 mg | Freq: Four times a day (QID) | RECTAL | Status: DC | PRN

## 2019-09-25 MED ORDER — LIDOCAINE 5 % EX PTCH
1.0000 | MEDICATED_PATCH | Freq: Two times a day (BID) | CUTANEOUS | Status: DC | PRN
  Administered 2019-09-25 – 2019-09-26 (×3): 1 via TRANSDERMAL
  Filled 2019-09-25 (×3): qty 1

## 2019-09-25 MED ORDER — ENOXAPARIN SODIUM 30 MG/0.3ML ~~LOC~~ SOLN
30.0000 mg | SUBCUTANEOUS | Status: DC
  Administered 2019-09-25 – 2019-09-27 (×3): 30 mg via SUBCUTANEOUS
  Filled 2019-09-25 (×3): qty 0.3

## 2019-09-25 MED ORDER — CHLORHEXIDINE GLUCONATE CLOTH 2 % EX PADS
6.0000 | MEDICATED_PAD | Freq: Every day | CUTANEOUS | Status: DC
  Administered 2019-09-25: 16:00:00 6 via TOPICAL

## 2019-09-25 MED ORDER — TRAZODONE HCL 50 MG PO TABS: 25.0000 mg | ORAL_TABLET | Freq: Every evening | ORAL | Status: DC | PRN

## 2019-09-25 MED ORDER — FERROUS SULFATE 325 (65 FE) MG PO TABS
325.0000 mg | ORAL_TABLET | Freq: Two times a day (BID) | ORAL | Status: DC
  Administered 2019-09-25 – 2019-09-28 (×7): 325 mg via ORAL
  Filled 2019-09-25 (×10): qty 1

## 2019-09-25 MED ORDER — LACOSAMIDE 50 MG PO TABS
100.0000 mg | ORAL_TABLET | Freq: Two times a day (BID) | ORAL | Status: DC
  Administered 2019-09-25 – 2019-09-28 (×6): 100 mg via ORAL
  Filled 2019-09-25 (×7): qty 2

## 2019-09-25 NOTE — Sepsis Progress Note (Signed)
Notified provider of need to order repeat lactic acid d/t rise. Asked to place order. Order placed.

## 2019-09-25 NOTE — TOC Progression Note (Signed)
Transition of Care [facility]) - Progression Note    Patient Details  Name: Katherine [name] MRN: [id number] Date of Birth: 06-19-1940  Transition of Care [facility]) CM/SW Contact  Sahana Boyland, Gardiner Rhyme, LCSW Phone Number: 09/25/2019, 2:10 PM  Clinical Narrative: Pt is a long term pt from [facility] and will return once medically stable. She has had her COVID vaccines and she likes it at [facility]. [facility] aware pt is here and can accept her back once stable.           Expected Discharge Plan and Services                                                 Social Determinants of Health (SDOH) Interventions    Readmission Risk Interventions Readmission Risk Prevention Plan 11/26/2018 11/22/2018 07/16/2018  Transportation Screening Complete Complete Complete  Medication Review [employer] photographer) Complete Complete Complete  PCP or Specialist appointment within 3-5 days of discharge (No Data) - Complete  PCP/Specialist Appt Not Complete comments - - -  HRI or [place] (No Data) - Complete  SW Recovery Care/Counseling Consult Complete - Not Complete  SW Consult Not Complete Comments - - na  Palliative Care Screening - - Not Applicable  Medication Reconcilation ([place] Complete Complete Complete  Some recent data might be hidden

## 2019-09-25 NOTE — H&P (Signed)
[facility] NAME: Katherine [name]    MR#:  [id number]  DATE OF BIRTH:  1940-05-03  DATE OF ADMISSION:  09/24/2019  PRIMARY CARE PHYSICIAN: Care, [facility]   REQUESTING/REFERRING PHYSICIAN: Gonzella Lex, MD CHIEF COMPLAINT:   Chief Complaint  Patient presents with   Altered Mental Status   Fever    HISTORY OF PRESENT ILLNESS:  Katherine [name]  is a 79 y.o. African-American female with a known history of COPD, type diabetes mellitus, CHF, hypertension, GERD, CVA and seizures who presented to the emergency room with acute onset of altered mental status as well as fever at her skilled nursing facility, with associated generalized weakness and chills.  She admits to urinary frequency and urgency without dysuria or hematuria or flank pain.  She denies any nausea or vomiting or diarrhea or abdominal pain.  No chest pain or dyspnea or cough or wheezing.  No headache or dizziness or blurred vision.  Upon presentation to the emergency room, temperature was one 1.3 with a heart rate of 112 respiratory to 30 with approximately 99% on 4 L O2 by nasal cannula.  Labs revealed a BUN of 30 and creatinine 1.82 compared to 21 and 1.52 a month ago legume is 2.5 and total protein of 7.3.  Lactic acid was 1.4 and procalcitonin 0.19.  CBC showed leukocytosis 11.3 and anemia as well as neutrophilia.  UA was positive for UTI.  Influenza AMB antigen to back negative as well as COVID-19 PCR.EKG showed sinus tachycardia with a rate of 115 with left anterior fascicular block and low voltage QRS.  The patient was given IV cefepime and vancomycin as well as 1 L bolus of IV lactated Ringer and 1 L IV normal saline followed 150 mL/h and 325 mg p.o. Tylenol.  She will be admitted to a medically monitored bed for further evaluation and management. PAST MEDICAL HISTORY:   Past Medical History:  Diagnosis Date   Angioedema    Anxiety and depression    CHF (congestive  heart failure) (HCC)    Chronic constipation    Chronic kidney disease    stage 3   COPD (chronic obstructive pulmonary disease) (HCC)    Diabetes mellitus without complication (HCC)    Gallstones    GERD (gastroesophageal reflux disease)    Hypertension    Left ventricular hypertrophy    Osteoarthritis    Prurigo nodularis    Seizures (HCC)    Stroke ([place])    Tobacco abuse    Vitamin D deficiency disease     PAST SURGICAL HISTORY:   Past Surgical History:  Procedure Laterality Date   CHOLECYSTECTOMY     POLYPECTOMY  11/2011   vocal cord   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   Procedure: TOTAL KNEE ARTHROPLASTY;  Surgeon: Hessie Knows, MD;  Location: [facility] ORS;  Service: Orthopedics;  Laterality: Right;    SOCIAL HISTORY:   Social History   Tobacco Use   Smoking status: Former Smoker    Packs/day: 0.50    Years: 60.00    Pack years: 30.00    Types: Cigarettes   Smokeless tobacco: Never Used   Tobacco comment: quite 42mo ago-03/26/18  Substance Use Topics   Alcohol use: No    Alcohol/week: 0.0 standard drinks    Comment: rare    FAMILY HISTORY:   Family History  Problem Relation Age of Onset   Alcohol abuse Mother    Sickle cell anemia Daughter  Hypertension Son    Cancer Neg Hx    COPD Neg Hx    Diabetes Neg Hx    Heart disease Neg Hx    Stroke Neg Hx     DRUG ALLERGIES:   Allergies  Allergen Reactions   Bee Venom Swelling   Enalapril Maleate Swelling    REVIEW OF SYSTEMS:   ROS As per history of present illness. All pertinent systems were reviewed above. Constitutional, HEENT, cardiovascular, respiratory, GI, GU, musculoskeletal, neuro, psychiatric, endocrine, integumentary and hematologic systems were reviewed and are otherwise negative/unremarkable except for positive findings mentioned above in the HPI.   MEDICATIONS AT HOME:   Prior to Admission medications   Medication Sig Start Date End Date  Taking? Authorizing Provider  acetaminophen (TYLENOL) 650 MG CR tablet Take 650 mg by mouth 3 (three) times daily.     [provider]  albuterol (ACCUNEB) 0.63 MG/3ML nebulizer solution Take 3 mLs by nebulization every 8 (eight) hours as needed for wheezing.     [provider]  atorvastatin (LIPITOR) 80 MG tablet Take 80 mg by mouth every evening.     [provider]  cetirizine (ZYRTEC) 10 MG tablet Take 10 mg by mouth daily.     [provider]  cholecalciferol (VITAMIN D) 1000 units tablet Take 1,000 Units by mouth daily.    [provider]  docusate sodium (COLACE) 100 MG capsule Take 100 mg by mouth 2 (two) times daily.    [provider]  ferrous sulfate 325 (65 FE) MG tablet Take 325 mg by mouth 2 (two) times daily.    [provider]  Fluticasone-Umeclidin-Vilant 100-62.5-25 MCG/INH AEPB Inhale 1 puff into the lungs daily.    [provider]  gabapentin (NEURONTIN) 300 MG capsule Take 300 mg by mouth at bedtime.    [provider]  ipratropium-albuterol (DUONEB) 0.5-2.5 (3) MG/3ML SOLN Take 3 mLs by nebulization 3 (three) times daily. 07/17/18   Gladstone Lighter, MD  lacosamide 100 MG TABS Take 1 tablet (100 mg total) by mouth 2 (two) times daily. 12/31/18   Thurnell Lose, MD  levETIRAcetam (KEPPRA) 750 MG tablet Take 1,500 mg by mouth daily.    [provider]  linagliptin (TRADJENTA) 5 MG TABS tablet Take 5 mg by mouth daily.    [provider]  meclizine (ANTIVERT) 25 MG tablet Take 25 mg by mouth every 12 (twelve) hours as needed for dizziness.     [provider]  Menthol, Topical Analgesic, (BIOFREEZE) 4 % GEL Apply 1 application topically in the morning and at bedtime. To both knees     [provider]  mupirocin ointment (BACTROBAN) 2 % Place 1 application into the nose 2 (two) times daily. 06/13/18   Loletha Grayer, MD  nitroGLYCERIN (NITROSTAT) 0.4 MG SL  tablet Place 0.4 mg under the tongue every 5 (five) minutes as needed for chest pain.  08/07/18   [provider]  pantoprazole (PROTONIX) 40 MG tablet Take 1 tablet (40 mg total) by mouth daily. 04/29/18   Bettey Costa, MD  Saccharomyces boulardii (PROBIOTIC) 250 MG CAPS Take 1 capsule by mouth 2 times daily at 12 noon and 4 pm. Patient taking differently: Take 1 capsule by mouth 2 (two) times daily.  04/05/19   Merlyn Lot, MD  Saline GEL Place 2 sprays into the nose 3 (three) times daily.     [provider]  vitamin C (VITAMIN C) 500 MG tablet Take 1 tablet (500  mg total) by mouth daily. 01/12/19   Allie Bossier, MD  zinc sulfate 220 (50 Zn) MG capsule Take 1 capsule (220 mg total) by mouth daily. 01/12/19   Allie Bossier, MD      VITAL SIGNS:  Blood pressure 94/75, pulse 99, temperature (!) 100.8 F (38.2 C), resp. rate (!) 23, height 5' (1.524 m), weight 81.6 kg, SpO2 100 %.  PHYSICAL EXAMINATION:  Physical Exam  GENERAL:  79 y.o.-year-old African-American female patient lying in the bed with no acute distress.  She was fairly somnolent but arousable and slow to answer questions.  EYES: Pupils equal, round, reactive to light and accommodation. No scleral icterus. Extraocular muscles intact.  HEENT: Head atraumatic, normocephalic. Oropharynx and nasopharynx clear.  NECK:  Supple, no jugular venous distention. No thyroid enlargement, no tenderness.  LUNGS: Normal breath sounds bilaterally, no wheezing, rales,rhonchi or crepitation. No use of accessory muscles of respiration.  CARDIOVASCULAR: Regular rate and rhythm, S1, S2 normal. No murmurs, rubs, or gallops.  ABDOMEN: Soft, nondistended, nontender. Bowel sounds present. No organomegaly or mass.  EXTREMITIES: No pedal edema, cyanosis, or clubbing.  NEUROLOGIC: Cranial nerves II through XII are intact. Muscle strength 5/5 in all extremities. Sensation intact. Gait not checked.  PSYCHIATRIC: The patient is  She was  fairly somnolent but arousable and slow to answer questions.  SKIN: No obvious rash, lesion, or ulcer.   LABORATORY PANEL:   CBC Recent Labs  Lab 09/24/19 2348  WBC 11.3*  HGB 10.7*  HCT 33.8*  PLT 225   ------------------------------------------------------------------------------------------------------------------  Chemistries  Recent Labs  Lab 09/24/19 2348  NA 138  K 4.9  CL 103  CO2 26  GLUCOSE 101*  BUN 30*  CREATININE 1.82*  CALCIUM 10.1  AST 37  ALT 25  ALKPHOS 147*  BILITOT 0.8   ------------------------------------------------------------------------------------------------------------------  Cardiac Enzymes No results for input(s): TROPONINI in the last 168 hours. ------------------------------------------------------------------------------------------------------------------  RADIOLOGY:  DG Chest Portable 1 View  Result Date: 09/25/2019 CLINICAL DATA:  Sepsis EXAM: PORTABLE CHEST 1 VIEW COMPARISON:  06/19/2019 radiograph, CT 12/20/2018 FINDINGS: Small right-sided pleural effusion or pleural thickening. Coarse pulmonary interstitium likely due to chronic change. Minimal streaky atelectasis or scar at the right base. Stable enlarged cardiomediastinal silhouette with aortic atherosclerosis. No pneumothorax. IMPRESSION: Small right-sided pleural effusion or pleural thickening with streaky atelectasis or scar at the right base. Electronically Signed   By: Donavan Foil M.D.   On: 09/25/2019 00:22      IMPRESSION AND PLAN:   1.  Metabolic cephalopathy like secondary to UTI. -The patient will be admitted to a medically monitored bed. -We will continue antibiotic therapy with IV Rocephin. -We will follow urine culture and sensitivity. -We will follow neuro checks every 4 hours for 24 hours.  2.  Sepsis secondary to UTI. -We will follow blood culture as well as urine culture and sensitivity. -We will continue antibiotic with IV Rocephin.  3.    Dyslipidemia. -We will continue statin therapy.  4.  Seizure disorder. -Continue Keppra and lacosamide.  5.  Type 2 diabetes mellitus. -The patient will be placed on supplemental coverage with NovoLog. -We will continue Tradjenta.  6.  GERD. -We will continue PPI therapy.  7.  DVT prophylaxis. -Subcutaneous Lovenox    All the records are reviewed and case discussed with ED provider. The plan of care was discussed in details with the patient (and family). I answered all questions. The patient agreed to proceed with the above mentioned plan. Further  management will depend upon hospital course.   CODE STATUS: Full code  Status is: Inpatient  Remains inpatient appropriate because:Ongoing active pain requiring inpatient pain management, Altered mental status, Ongoing diagnostic testing needed not appropriate for outpatient work up, Unsafe d/c plan, IV treatments appropriate due to intensity of illness or inability to take PO and Inpatient level of care appropriate due to severity of illness   Dispo: The patient is from: SNF              Anticipated d/c is to: SNF              Anticipated d/c date is: 2 days              Patient currently is medically stable to d/c.      TOTAL TIME TAKING CARE OF THIS PATIENT: 55 minutes.    Christel Mormon M.D on 09/25/2019 at 2:17 AM  [facility]   From 7 PM-7 AM, contact night-coverage [email or web address]  CC: Primary care physician; Care, [facility]   Note: This dictation was prepared with Dragon dictation along with smaller phrase technology. Any transcriptional typo errors that result from this process are unintentional.

## 2019-09-25 NOTE — Progress Notes (Signed)
CODE SEPSIS - PHARMACY COMMUNICATION  **Broad Spectrum Antibiotics should be administered within 1 hour of Sepsis diagnosis**  Time Code Sepsis Called/Page Received: 0010  Antibiotics Ordered: vanc/cefepime  Time of 1st antibiotic administration: 0010  Additional action taken by pharmacy:   If necessary, Name of Provider/Nurse Contacted:     Tobie Lords ,PharmD Clinical Pharmacist  09/25/2019  12:17 AM

## 2019-09-25 NOTE — ED Provider Notes (Signed)
[facility] Emergency Department Provider Note  ____________________________________________  Time seen: Approximately 12:22 AM  I have reviewed the triage vital signs and the nursing notes.   HISTORY  Chief Complaint Altered Mental Status and Fever  Level 5 caveat:  Portions of the history and physical were unable to be obtained due to AMS   HPI Katherine [name] is a 79 y.o. female with a history of diastolic CHF, CKD, COPD on 3 L nasal cannula, hypertension, stroke, diabetes who presents from her SNF for fever and altered mental status.  Per EMS patient was declining throughout the day today and more sleepy than normal. This evening, noticed to be altered and had a fever of 103.43F. Received rectal tylenol. Patient arrives complaining of bilateral leg pain.  Of note, patient received a Covid shot yesterday.  Past Medical History:  Diagnosis Date  . Angioedema   . Anxiety and depression   . CHF (congestive heart failure) ([place])   . Chronic constipation   . Chronic kidney disease    stage 3  . COPD (chronic obstructive pulmonary disease) ([place])   . Diabetes mellitus without complication ([place])   . Gallstones   . GERD (gastroesophageal reflux disease)   . Hypertension   . Left ventricular hypertrophy   . Osteoarthritis   . Prurigo nodularis   . Seizures ([place])   . Stroke ([place])   . Tobacco abuse   . Vitamin D deficiency disease     Patient Active Problem List   Diagnosis Date Noted  . Sepsis due to gram-negative UTI ([place]) 06/19/2019  . Type II diabetes mellitus with renal manifestations ([place]) 06/19/2019  . Stroke ([place]) 06/19/2019  . AAA (abdominal aortic aneurysm) ([place]) 06/19/2019  . Acute metabolic encephalopathy 24/58/0998  . Iron deficiency anemia   . Altered mental status   . Acute encephalopathy 02/11/2019  . Sepsis secondary to UTI ([place]) 01/08/2019  . Acute renal failure superimposed on stage 3b chronic kidney disease ([place]) 01/08/2019  .  Seizure disorder ([place]) 01/08/2019  . Diabetes mellitus type 2, controlled, with complications ([place]) 33/82/5053  . Acute on chronic anemia 01/03/2019  . COVID-19 12/21/2018  . COVID-19 virus infection 12/21/2018  . Hemoptysis 12/20/2018  . Pulmonary embolism ([place]) 11/21/2018  . Delirium   . UTI (urinary tract infection) 08/21/2018  . Chest pain 07/15/2018  . Palliative care encounter 05/10/2018  . Localized edema 05/10/2018  . Shortness of breath 05/10/2018  . Hematemesis 04/28/2018  . Influenza A 04/13/2018  . Acute on chronic congestive heart failure ([place])   . COPD with acute exacerbation ([place]) 02/24/2018  . Chronic diastolic heart failure ([place]) 12/31/2017  . Lymphedema 12/31/2017  . COPD exacerbation ([place]) 11/30/2017  . Urinary tract infection 11/22/2017  . 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[place]) 11/16/2017  . Diabetes mellitus type 2, uncomplicated ([place]) 97/67/3419  . Pressure injury of skin 10/29/2017  . Acute kidney injury superimposed on CKD ([place]) 10/24/2017  . Sepsis ([place]) 10/24/2017  . Possible Seizures ([place]) 10/08/2017  . Hyperlipemia 10/08/2017  . 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 Stroke-like episode ([place]) s/p IV tpa 10/04/2017  . Palliative care by specialist   . Elevated rheumatoid factor 09/05/2017  . Frequent hospital admissions 08/22/2017  . Aphasia 06/28/2017  . Right hand pain 03/21/2017  . Dry skin 03/21/2017  . Pain in finger of left hand 09/12/2016  . Chronic fatigue 06/12/2016  . Left knee pain 06/12/2016  . Goals of care, counseling/discussion 03/13/2016  .  Primary localized osteoarthritis of right knee 02/08/2016  . OSA (obstructive sleep apnea) 09/16/2015  . Rotator cuff syndrome 09/07/2015  . 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 Sleep disturbance 04/14/2015  . Coronary artery disease 03/14/2015  . 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 Acute respiratory failure with hypoxia ([place]) 09/16/2014  . 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/2014  . Anxiety   . Tobacco abuse   . Prurigo nodularis   . GERD (gastroesophageal reflux disease)   . COPD (chronic obstructive pulmonary disease) ([place])   . Osteoarthritis   . Vitamin D deficiency disease   . Chronic constipation   . CKD (chronic kidney disease), stage III   . Essential hypertension 05/20/2013  . Morbid obesity ([place]) 05/20/2013    Past Surgical History:  Procedure Laterality Date  . CHOLECYSTECTOMY    . 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   Procedure: TOTAL KNEE ARTHROPLASTY;  Surgeon: Hessie Knows, MD;  Location: [facility] ORS;  Service: Orthopedics;  Laterality: Right;    Prior to Admission medications   Medication Sig Start Date End Date Taking? Authorizing Provider  acetaminophen (TYLENOL) 650 MG CR tablet Take 650 mg by mouth 3 (three) times daily.     [provider]  albuterol (ACCUNEB) 0.63 MG/3ML nebulizer solution Take 3 mLs by nebulization every 8 (eight) hours as needed for wheezing.     [provider]  atorvastatin (LIPITOR) 80 MG tablet Take 80 mg by mouth every evening.     [provider]  cetirizine (ZYRTEC) 10 MG tablet Take 10 mg by mouth daily.     [provider]  cholecalciferol (VITAMIN D) 1000 units tablet Take 1,000 Units by mouth daily.    [provider]  docusate sodium (COLACE) 100 MG capsule Take 100 mg by mouth 2 (two) times daily.    [provider]  ferrous sulfate 325 (65 FE) MG tablet Take 325 mg by mouth 2 (two) times daily.    [provider]  Fluticasone-Umeclidin-Vilant 100-62.5-25 MCG/INH AEPB Inhale 1 puff into the lungs daily.    [provider]  gabapentin (NEURONTIN) 300 MG capsule Take 300 mg by mouth at bedtime.    [provider]  ipratropium-albuterol (DUONEB) 0.5-2.5 (3) MG/3ML SOLN Take 3 mLs by nebulization 3 (three) times daily. 07/17/18   Gladstone Lighter, MD  lacosamide 100 MG TABS Take 1  tablet (100 mg total) by mouth 2 (two) times daily. 12/31/18   Thurnell Lose, MD  levETIRAcetam (KEPPRA) 750 MG tablet Take 1,500 mg by mouth daily.    [provider]  linagliptin (TRADJENTA) 5 MG TABS tablet Take 5 mg by mouth daily.    [provider]  meclizine (ANTIVERT) 25 MG tablet Take 25 mg by mouth every 12 (twelve) hours as needed for dizziness.     [provider]  Menthol, Topical Analgesic, (BIOFREEZE) 4 % GEL Apply 1 application topically in the morning and at bedtime. To both knees     [provider]  mupirocin ointment (BACTROBAN) 2 % Place 1 application into the nose 2 (two) times daily. 06/13/18   Loletha Grayer, MD  nitroGLYCERIN (NITROSTAT) 0.4 MG SL tablet Place 0.4 mg under the tongue every 5 (five) minutes as needed for chest pain.  08/07/18   [provider]  pantoprazole (PROTONIX) 40 MG tablet Take 1 tablet (40 mg total) by mouth daily. 04/29/18   Bettey Costa, MD  Saccharomyces boulardii (PROBIOTIC) 250  MG CAPS Take 1 capsule by mouth 2 times daily at 12 noon and 4 pm. Patient taking differently: Take 1 capsule by mouth 2 (two) times daily.  04/05/19   Merlyn Lot, MD  Saline GEL Place 2 sprays into the nose 3 (three) times daily.     [provider]  vitamin C (VITAMIN C) 500 MG tablet Take 1 tablet (500 mg total) by mouth daily. 01/12/19   Allie Bossier, MD  zinc sulfate 220 (50 Zn) MG capsule Take 1 capsule (220 mg total) by mouth daily. 01/12/19   Allie Bossier, MD    Allergies Bee venom and Enalapril maleate  Family History  Problem Relation Age of Onset  . Alcohol abuse Mother   . Sickle cell anemia Daughter   . Hypertension Son   . Cancer Neg Hx   . COPD Neg Hx   . Diabetes Neg Hx   . Heart disease Neg Hx   . Stroke Neg Hx     Social History Social History   Tobacco Use  . Smoking status: Former Smoker    Packs/day: 0.50    Years: 60.00    Pack years: 30.00    Types: Cigarettes    . Smokeless tobacco: Never Used  . Tobacco comment: quite 21mo ago-03/26/18  Vaping Use  . Vaping Use: Never used  Substance Use Topics  . Alcohol use: No    Alcohol/week: 0.0 standard drinks    Comment: rare  . Drug use: No    Review of Systems  Constitutional: + fever. Cardiovascular: Negative for chest pain. Respiratory: Negative for shortness of breath. + cough Gastrointestinal: Negative for abdominal pain, vomiting or diarrhea. Musculoskeletal: + bilateral leg pain ____________________________________________   PHYSICAL EXAM:  VITAL SIGNS: ED Triage Vitals  Enc Vitals Group     BP 09/24/19 2359 94/75     Pulse Rate 09/24/19 2359 (!) 112     Resp 09/24/19 2359 (!) 30     Temp 09/24/19 2359 (!) 101.3 F (38.5 C)     Temp Source 09/24/19 2359 Rectal     SpO2 09/24/19 2348 100 %     Weight 09/24/19 2349 179 lb 14.3 oz (81.6 kg)     Height 09/24/19 2349 5' (1.524 m)     Head Circumference --      Peak Flow --      Pain Score --      Pain Loc --      Pain Edu? --      Excl. in [place]? --     Constitutional: Confused but awake HEENT:      Head: Normocephalic and atraumatic.         Eyes: Conjunctivae are normal. Sclera is non-icteric.       Mouth/Throat: Mucous membranes are dry.       Neck: Supple with no signs of meningismus. Cardiovascular: Tachycardic with regular rhythm Respiratory: increased work of breathing, tachypneic, satting well on her baseline 3 L, upper airway rhonchi Gastrointestinal: Soft, non tender. Genitourinary: No decubitus ulcer Musculoskeletal: No edema, cyanosis, or erythema of extremities, legs are warm and well perfused with normal distal pulses. Neurologic: Normal speech. Face is symmetric. Moving all extremities. No gross focal neurologic deficits are appreciated. Skin: Skin is warm, dry and intact. No rash noted.  ____________________________________________   LABS (all labs ordered are listed, but only abnormal results are  displayed)  Labs Reviewed  CBC WITH DIFFERENTIAL/PLATELET - Abnormal; Notable for the following components:  Result Value   WBC 11.3 (*)    RBC 3.67 (*)    Hemoglobin 10.7 (*)    HCT 33.8 (*)    Neutro Abs 8.8 (*)    All other components within normal limits  COMPREHENSIVE METABOLIC PANEL - Abnormal; Notable for the following components:   Glucose, Bld 101 (*)    BUN 30 (*)    Creatinine, Ser 1.82 (*)    Albumin 2.5 (*)    Alkaline Phosphatase 147 (*)    GFR calc non Af Amer 26 (*)    GFR calc Af Amer 31 (*)    All other components within normal limits  URINALYSIS, COMPLETE (UACMP) WITH MICROSCOPIC - Abnormal; Notable for the following components:   Color, Urine YELLOW (*)    APPearance TURBID (*)    Hgb urine dipstick LARGE (*)    Protein, ur 30 (*)    Leukocytes,Ua LARGE (*)    WBC, UA >50 (*)    Bacteria, UA MANY (*)    All other components within normal limits  RESP PANEL BY RT PCR (RSV, FLU A&B, COVID)  CULTURE, BLOOD (ROUTINE X 2)  CULTURE, BLOOD (ROUTINE X 2)  URINE CULTURE  LACTIC ACID, PLASMA  PROTIME-INR  PROCALCITONIN  APTT  LACTIC ACID, PLASMA  PROCALCITONIN  PROTIME-INR  CORTISOL-AM, BLOOD  BASIC METABOLIC PANEL  CBC   ____________________________________________  EKG  ED ECG REPORT I, Rudene Re, the attending physician, personally viewed and interpreted this ECG.  Sinus tachycardia, rate of 115, LAFB, normal QTC, right axis deviation, inferior and anterior Q waves and no ST elevation. ____________________________________________  RADIOLOGY  I have personally reviewed the images performed during this visit and I agree with the Radiologist's read.   Interpretation by Radiologist:  DG Chest Portable 1 View  Result Date: 09/25/2019 CLINICAL DATA:  Sepsis EXAM: PORTABLE CHEST 1 VIEW COMPARISON:  06/19/2019 radiograph, CT 12/20/2018 FINDINGS: Small right-sided pleural effusion or pleural thickening. Coarse pulmonary interstitium likely  due to chronic change. Minimal streaky atelectasis or scar at the right base. Stable enlarged cardiomediastinal silhouette with aortic atherosclerosis. No pneumothorax. IMPRESSION: Small right-sided pleural effusion or pleural thickening with streaky atelectasis or scar at the right base. Electronically Signed   By: Donavan Foil M.D.   On: 09/25/2019 00:22     ____________________________________________   PROCEDURES  Procedure(s) performed:yes .1-3 Lead EKG Interpretation Performed by: Rudene Re, MD Authorized by: Rudene Re, MD     Interpretation: non-specific     ECG rate assessment: tachycardic     Rhythm: sinus tachycardia     Ectopy: none     Critical Care performed: yes  CRITICAL CARE Performed by: Rudene Re  ?  Total critical care time: 45 min  Critical care time was exclusive of separately billable procedures and treating other patients.  Critical care was necessary to treat or prevent imminent or life-threatening deterioration.  Critical care was time spent personally by me on the following activities: development of treatment plan with patient and/or surrogate as well as nursing, discussions with consultants, evaluation of patient's response to treatment, examination of patient, obtaining history from patient or surrogate, ordering and performing treatments and interventions, ordering and review of laboratory studies, ordering and review of radiographic studies, pulse oximetry and re-evaluation of patient's condition.  ____________________________________________   INITIAL IMPRESSION / ASSESSMENT AND PLAN / ED COURSE   79 y.o. female with a history of diastolic CHF, CKD, COPD on 3 L nasal cannula, hypertension, stroke, diabetes who presents from  her SNF for fever and altered mental status.  Patient arrives confused but awake, febrile, tachycardic, tachypneic with blood pressure of 94/75 meeting sepsis criteria on arrival.  She has increased  work of breathing and coarse rhonchi of the upper airway, abdomen is soft, extremities are normal and well perfused with no signs of infection or edema, no decubitus ulcer.  Differential diagnosis including sepsis from UTI versus pneumonia versus Covid versus bacteremia.  Labs and urine are pending, chest x-ray pending.  Will initiate IV fluids although will be careful and probably not provide 30 cc/kg since patient has a history of CHF, will cover with broad-spectrum antibiotics.  Cultures have been sent.  Old medical records reviewed.  _________________________ 2:26 AM on 09/25/2019 -----------------------------------------  UA positive for UTI.  Chest x-ray negative for pneumonia, confirmed by radiology.  Mild leukocytosis with normal lactic.  Procalcitonin normal at 0.19.  Covid, flu, and RSV negative.  Patient admitted to the hospitalist service.     _____________________________________________ Please note:  Patient was evaluated in Emergency Department today for the symptoms described in the history of present illness. Patient was evaluated in the context of the global COVID-19 pandemic, which necessitated consideration that the patient might be at risk for infection with the SARS-CoV-2 virus that causes COVID-19. Institutional protocols and algorithms that pertain to the evaluation of patients at risk for COVID-19 are in a state of rapid change based on information released by regulatory bodies including the CDC and federal and state organizations. These policies and algorithms were followed during the patient's care in the ED.  Some ED evaluations and interventions may be delayed as a result of limited staffing during the pandemic.   [place] Controlled Substance Database was reviewed by me. ____________________________________________   FINAL CLINICAL IMPRESSION(S) / ED DIAGNOSES   Final diagnoses:  Sepsis with encephalopathy without septic shock, due to unspecified organism [facility])   Acute cystitis with hematuria      NEW MEDICATIONS STARTED DURING THIS VISIT:  ED Discharge Orders    None       Note:  This document was prepared using Dragon voice recognition software and may include unintentional dictation errors.    Alfred Levins, [place], MD 09/25/19 [phone]

## 2019-09-25 NOTE — NC FL2 (Signed)
[place] LEVEL OF CARE SCREENING TOOL     IDENTIFICATION  Patient Name: Katherine [name] Birthdate: 1940-07-14 Sex: female Admission Date (Current Location): 09/24/2019  [employer] and [place] Number:  [name] [id number] [place] and Address:  [facility], [address] [place], [place] [address]      Provider Number: [id number]  Attending Physician Name and Address:  Lorella Nimrod, MD  Relative Name and Phone Number:  Rushie Nyhan [phone]    Current Level of Care: Hospital Recommended Level of Care: [place] Prior Approval Number:    Date Approved/Denied:   PASRR Number: [id number]  Discharge Plan: SNF    Current Diagnoses: Patient Active Problem List   Diagnosis Date Noted  . Sepsis due to gram-negative UTI ([place]) 06/19/2019  . Type II diabetes mellitus with renal manifestations ([place]) 06/19/2019  . Stroke ([place]) 06/19/2019  . AAA (abdominal aortic aneurysm) ([place]) 06/19/2019  . Acute metabolic encephalopathy 43/15/4008  . Iron deficiency anemia   . Altered mental status   . Acute encephalopathy 02/11/2019  . Sepsis secondary to UTI ([place]) 01/08/2019  . Acute renal failure superimposed on stage 3b chronic kidney disease ([place]) 01/08/2019  . Seizure disorder ([place]) 01/08/2019  . Diabetes mellitus type 2, controlled, with complications ([place]) 67/61/9509  . Acute on chronic anemia 01/03/2019  . COVID-19 12/21/2018  . COVID-19 virus infection 12/21/2018  . Hemoptysis 12/20/2018  . Pulmonary embolism ([place]) 11/21/2018  . Delirium   . UTI (urinary tract infection) 08/21/2018  . Chest pain 07/15/2018  . Palliative care encounter 05/10/2018  . Localized edema 05/10/2018  . Shortness of breath 05/10/2018  . Hematemesis 04/28/2018  . Influenza A 04/13/2018  . Acute on chronic congestive heart failure ([place])   . COPD with acute exacerbation ([place]) 02/24/2018  . Chronic diastolic heart failure ([place]) 12/31/2017   . Lymphedema 12/31/2017  . COPD exacerbation ([place]) 11/30/2017  . Urinary tract infection 11/22/2017  . 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[place]) 11/16/2017  . Diabetes mellitus type 2, uncomplicated ([place]) 32/67/1245  . Pressure injury of skin 10/29/2017  . Acute kidney injury superimposed on CKD ([place]) 10/24/2017  . Sepsis ([place]) 10/24/2017  . Possible Seizures ([place]) 10/08/2017  . Hyperlipemia 10/08/2017  . 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 Stroke-like episode ([place]) s/p IV tpa 10/04/2017  . Palliative care by specialist   . Elevated rheumatoid factor 09/05/2017  . Frequent hospital admissions 08/22/2017  . Aphasia 06/28/2017  . Right hand pain 03/21/2017  . Dry skin 03/21/2017  . Pain in finger of left hand 09/12/2016  . Chronic fatigue 06/12/2016  . Left knee pain 06/12/2016  . Goals of care, counseling/discussion 03/13/2016  . Primary localized osteoarthritis of right knee 02/08/2016  . OSA (obstructive sleep apnea) 09/16/2015  . Rotator cuff syndrome 09/07/2015  . 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 Sleep disturbance 04/14/2015  . Coronary artery disease 03/14/2015  . 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 Acute respiratory failure with hypoxia ([place]) 09/16/2014  . 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 Anxiety   . Tobacco abuse   . Prurigo nodularis   . GERD (gastroesophageal reflux disease)   . COPD (chronic obstructive pulmonary disease) ([place])   . Osteoarthritis   . Vitamin D deficiency disease   . Chronic constipation   . CKD (chronic kidney disease), stage III   . Essential hypertension 05/20/2013  . Morbid obesity ([place]) 05/20/2013    Orientation RESPIRATION BLADDER Height & Weight     Self,  Place  O2 (2 liters) Indwelling catheter Weight: 179 lb 14.3 oz (81.6 kg) Height:  5' (152.4 cm)  BEHAVIORAL SYMPTOMS/MOOD NEUROLOGICAL BOWEL NUTRITION STATUS      Incontinent Diet (Heart healthy-thin liquids)  AMBULATORY STATUS COMMUNICATION OF NEEDS  Skin   Total Care Verbally Normal                       Personal Care Assistance Level of Assistance  Bathing, Dressing Bathing Assistance: Limited assistance   Dressing Assistance: Limited assistance     Functional Limitations Info  Speech     Speech Info: Impaired    SPECIAL CARE FACTORS FREQUENCY                       Contractures Contractures Info: Present (hands)    Additional Factors Info  Code Status, Isolation Precautions, Allergies Code Status Info: Full Code Allergies Info: Bee venom, EWnalapril Maleate     Isolation Precautions Info: MRSA and ESBL     Current Medications (09/25/2019):  This is the current hospital active medication list Current Facility-Administered Medications  Medication Dose Route Frequency Provider Last Rate Last Admin  . 0.9 %  sodium chloride infusion   Intravenous Continuous Mansy, Jan A, MD 100 mL/hr at 09/25/19 0615 New Bag at 09/25/19 0615  . acetaminophen (TYLENOL) suppository 325 mg  325 mg Rectal Once Alfred Levins, [place], MD      . acetaminophen (TYLENOL) tablet 650 mg  650 mg Oral Q6H PRN Mansy, Jan A, MD   650 mg at 09/25/19 0426   Or  . acetaminophen (TYLENOL) suppository 650 mg  650 mg Rectal Q6H PRN Mansy, Jan A, MD      . acidophilus (RISAQUAD) capsule 1 capsule  1 capsule Oral BID Mansy, Jan A, MD   1 capsule at 09/25/19 1254  . ascorbic acid (VITAMIN C) tablet 500 mg  500 mg Oral Daily Mansy, Jan A, MD   500 mg at 09/25/19 1254  . atorvastatin (LIPITOR) tablet 80 mg  80 mg Oral QPM Mansy, Jan A, MD      . cefTRIAXone (ROCEPHIN) 1 g in sodium chloride 0.9 % 100 mL IVPB  1 g Intravenous Q24H Mansy, Arvella Merles, MD   Stopped at 09/25/19 1191  . Chlorhexidine Gluconate Cloth 2 % PADS 6 each  6 each Topical Daily Lorella Nimrod, MD      . cholecalciferol (VITAMIN D3) tablet 1,000 Units  1,000 Units Oral Daily Mansy, Arvella Merles, MD   1,000 Units at 09/25/19 1252  . docusate sodium (COLACE) capsule 100 mg  100 mg Oral BID Mansy,  Jan A, MD   100 mg at 09/25/19 1253  . enoxaparin (LOVENOX) injection 30 mg  30 mg Subcutaneous Q24H Mansy, Jan A, MD   30 mg at 09/25/19 4782  . ferrous sulfate tablet 325 mg  325 mg Oral BID Mansy, Jan A, MD   325 mg at 09/25/19 1254  . gabapentin (NEURONTIN) capsule 300 mg  300 mg Oral QHS Mansy, Jan A, MD      . insulin aspart (novoLOG) injection 0-15 Units  0-15 Units Subcutaneous TID WC Amin, Soundra Pilon, MD      . insulin aspart (novoLOG) injection 0-5 Units  0-5 Units Subcutaneous QHS Amin, Soundra Pilon, MD      . lacosamide (VIMPAT) tablet 100 mg  100 mg Oral BID Mansy, Jan A, MD      . levETIRAcetam (KEPPRA) tablet 750 mg  750  mg Oral Daily Mansy, Jan A, MD      . lidocaine (LIDODERM) 5 % 1 patch  1 patch Transdermal Q12H PRN Mansy, Arvella Merles, MD   1 patch at 09/25/19 0445  . linagliptin (TRADJENTA) tablet 5 mg  5 mg Oral Daily Mansy, Jan A, MD      . magnesium hydroxide (MILK OF MAGNESIA) suspension 30 mL  30 mL Oral Daily PRN Mansy, Jan A, MD      . meclizine (ANTIVERT) tablet 25 mg  25 mg Oral Q12H PRN Mansy, Jan A, MD      . nitroGLYCERIN (NITROSTAT) SL tablet 0.4 mg  0.4 mg Sublingual Q5 min PRN Mansy, Jan A, MD      . ondansetron [facility]) tablet 4 mg  4 mg Oral Q6H PRN Mansy, Jan A, MD       Or  . ondansetron [facility]) injection 4 mg  4 mg Intravenous Q6H PRN Mansy, Jan A, MD      . pantoprazole (PROTONIX) EC tablet 40 mg  40 mg Oral Daily Mansy, Jan A, MD   40 mg at 09/25/19 1254  . traZODone (DESYREL) tablet 25 mg  25 mg Oral QHS PRN Mansy, Jan A, MD      . zinc sulfate capsule 220 mg  220 mg Oral Daily Mansy, Jan A, MD   220 mg at 09/25/19 1253     Discharge Medications: Please see discharge summary for a list of discharge medications.  Relevant Imaging Results:  Relevant Lab Results:   Additional Information SSN: [id number]  Jetson Pickrel, Gardiner Rhyme, LCSW

## 2019-09-25 NOTE — Progress Notes (Signed)
Ch arrived at room in response to being called for emergent traffic. Healthcare team working on Pt when Ch arrived. Ch asked RN about family, and she reported that she will call for Ch when they get to the hospital.

## 2019-09-25 NOTE — ED Notes (Signed)
Pt cleansed of BM and clean sheets provided

## 2019-09-25 NOTE — Progress Notes (Signed)
No charge progress note.  Katherine [name]  is a 79 y.o. African-American female with a known history of COPD, type diabetes mellitus, CHF, hypertension, GERD, CVA and seizures who presented to the emergency room with acute onset of altered mental status as well as fever at her skilled nursing facility, with associated generalized weakness and chills.  She admits to urinary frequency and urgency without dysuria or hematuria or flank pain.  On presentation she met sepsis criteria with being febrile, leukocytosis, lactic acidosis, AKI and UA look infected.  Patient was feeling better when seen today.  Sepsis resolved.  Remained afebrile.  Lactic acidosis resolved.  Blood cultures remain negative.  Urine cultures are pending.  Continue with current management of ceftriaxone. AKI seems improving.  Creatinine of 1.76 this morning with baseline around 1.2-1.4.  I will continue with IV fluid for another day.  Anticipating discharge in a day back to her facility once urine culture results are available.

## 2019-09-25 NOTE — Progress Notes (Signed)
[facility] Liaison note: Patient is currently followed by [facility] community Palliative program at [facility].  TOC Becky Dupree made aware. Flo Shanks BSN, RN, [facility] [employer] [phone]

## 2019-09-26 DIAGNOSIS — N3001 Acute cystitis with hematuria: Secondary | ICD-10-CM | POA: Diagnosis not present

## 2019-09-26 DIAGNOSIS — A419 Sepsis, unspecified organism: Secondary | ICD-10-CM | POA: Diagnosis not present

## 2019-09-26 DIAGNOSIS — R6520 Severe sepsis without septic shock: Secondary | ICD-10-CM | POA: Diagnosis not present

## 2019-09-26 DIAGNOSIS — A4150 Gram-negative sepsis, unspecified: Secondary | ICD-10-CM | POA: Diagnosis not present

## 2019-09-26 LAB — HEMOGLOBIN A1C
Hgb A1c MFr Bld: 5.1 % (ref 4.8–5.6)
Mean Plasma Glucose: 100 mg/dL

## 2019-09-26 LAB — CBC
HCT: 27.4 % — ABNORMAL LOW (ref 36.0–46.0)
Hemoglobin: 9 g/dL — ABNORMAL LOW (ref 12.0–15.0)
MCH: 30 pg (ref 26.0–34.0)
MCHC: 32.8 g/dL (ref 30.0–36.0)
MCV: 91.3 fL (ref 80.0–100.0)
Platelets: 173 10*3/uL (ref 150–400)
RBC: 3 MIL/uL — ABNORMAL LOW (ref 3.87–5.11)
RDW: 14.6 % (ref 11.5–15.5)
WBC: 5.7 10*3/uL (ref 4.0–10.5)
nRBC: 0 % (ref 0.0–0.2)

## 2019-09-26 LAB — GLUCOSE, CAPILLARY
Glucose-Capillary: 76 mg/dL (ref 70–99)
Glucose-Capillary: 108 mg/dL — ABNORMAL HIGH (ref 70–99)
Glucose-Capillary: 97 mg/dL (ref 70–99)
Glucose-Capillary: 96 mg/dL (ref 70–99)

## 2019-09-26 LAB — PROCALCITONIN: Procalcitonin: 0.2 ng/mL

## 2019-09-26 MED ORDER — MUPIROCIN 2 % EX OINT
1.0000 "application " | TOPICAL_OINTMENT | Freq: Two times a day (BID) | CUTANEOUS | Status: DC
  Administered 2019-09-26 – 2019-09-28 (×5): 1 via NASAL
  Filled 2019-09-26: qty 22

## 2019-09-26 MED ORDER — CHLORHEXIDINE GLUCONATE CLOTH 2 % EX PADS
6.0000 | MEDICATED_PAD | Freq: Every day | CUTANEOUS | Status: DC
  Administered 2019-09-26 – 2019-09-28 (×3): 6 via TOPICAL

## 2019-09-26 MED ORDER — SODIUM CHLORIDE 0.9 % IV SOLN
1.0000 g | Freq: Two times a day (BID) | INTRAVENOUS | Status: DC
  Administered 2019-09-26 – 2019-09-28 (×4): 1 g via INTRAVENOUS
  Filled 2019-09-26 (×5): qty 1

## 2019-09-26 MED ORDER — FOSFOMYCIN TROMETHAMINE 3 G PO PACK
3.0000 g | PACK | Freq: Once | ORAL | Status: AC
  Administered 2019-09-26: 13:00:00 3 g via ORAL
  Filled 2019-09-26: qty 3

## 2019-09-26 NOTE — Discharge Summary (Addendum)
Physician Discharge Summary  Katherine [name] PFX:[id number] DOB: [birth year] DOA: 09/24/2019  PCP: [facility], [place] date: 09/24/2019 Discharge date: 09/28/2019  Admitted From: SNF Disposition:  SNF  Recommendations for Outpatient Follow-up:  1. Follow up with PCP in 1-2 weeks 2. Please obtain BMP/CBC in one week 3. Please follow up on the following pending results: Urine culture susceptibility results  Home Health: No Equipment/Devices: None Discharge Condition: Stable CODE STATUS: Full Diet recommendation: Heart Healthy / Carb Modified   Brief/Interim Summary: [id number] I30 y.o.African-American femalewith a known history of COPD, type diabetes mellitus, CHF, hypertension, GERD, CVA and seizureswho presented to the emergency room with acute onset of altered mental status as well as fever at her skilled nursing facility, with associated generalized weakness and chills. She admits to urinary frequency and urgency without dysuria or hematuria or flank pain.  On presentation she met sepsis criteria with being febrile, leukocytosis, lactic acidosis, AKI and UA look infected.  Sepsis resolved.  Blood cultures remain negative.  Urine culture with ESBL E. coli, patient also has a prior history of ESBL E. coli.  She responded very well initially to ceftriaxone, remained afebrile with improvement in leukocytosis, although sensitivity results showing resistant to ceftriaxone.  Might be a prior colonization.  She was given total of 2 dose of fosfomycin 48 hours apart and also received meropenem for 2 days.  Patient appears back to her baseline. She does not need any more antibiotics at this time and can be discharged back to her facility.  Patient did had Foley catheter in, unable to see any documentation whether it is chronic or not.  Per patient she was using diapers before.  We remove the Foley catheter and she was able to void without any difficulty.  She will continue with  rest of her home medications.  Discharge Diagnoses:  Active Problems:   Sepsis due to gram-negative UTI [facility])  Discharge Instructions  Discharge Instructions    Diet - low sodium heart healthy   Complete by: As directed    Increase activity slowly   Complete by: As directed      Allergies as of 09/27/2019      Reactions   Bee Venom Swelling   Enalapril Maleate Swelling      Medication List    TAKE these medications   acetaminophen 650 MG CR tablet Commonly known as: TYLENOL Take 650 mg by mouth 3 (three) times daily.   albuterol 0.63 MG/3ML nebulizer solution Commonly known as: ACCUNEB Take 3 mLs by nebulization every 8 (eight) hours as needed for wheezing.   ascorbic acid 500 MG tablet Commonly known as: VITAMIN C Take 1 tablet (500 mg total) by mouth daily.   atorvastatin 80 MG tablet Commonly known as: LIPITOR Take 80 mg by mouth every evening.   Biofreeze 4 % Gel Generic drug: Menthol (Topical Analgesic) Apply 1 application topically in the morning and at bedtime. To both knees   cetirizine 10 MG tablet Commonly known as: ZYRTEC Take 10 mg by mouth daily.   cholecalciferol 1000 units tablet Commonly known as: VITAMIN D Take 1,000 Units by mouth daily.   docusate sodium 100 MG capsule Commonly known as: COLACE Take 100 mg by mouth 2 (two) times daily.   ferrous sulfate 325 (65 FE) MG tablet Take 325 mg by mouth 2 (two) times daily.   Fluticasone-Umeclidin-Vilant 100-62.5-25 MCG/INH Aepb Inhale 1 puff into the lungs daily.   fosfomycin 3 g Pack Commonly known as: MONUROL Take 3  g by mouth once for 1 dose. On 09/28/2019   gabapentin 300 MG capsule Commonly known as: NEURONTIN Take 300 mg by mouth at bedtime.   ipratropium-albuterol 0.5-2.5 (3) MG/3ML Soln Commonly known as: DUONEB Take 3 mLs by nebulization 3 (three) times daily.   Lacosamide 100 MG Tabs Take 1 tablet (100 mg total) by mouth 2 (two) times daily.   levETIRAcetam 750 MG  tablet Commonly known as: KEPPRA Take 750 mg by mouth daily.   lidocaine 5 % Commonly known as: LIDODERM Place 1 patch onto the skin every 12 (twelve) hours as needed. Remove & Discard patch within 12 hours or as directed by MD   linagliptin 5 MG Tabs tablet Commonly known as: TRADJENTA Take 5 mg by mouth daily.   meclizine 25 MG tablet Commonly known as: ANTIVERT Take 25 mg by mouth every 12 (twelve) hours as needed for dizziness.   mupirocin ointment 2 % Commonly known as: BACTROBAN Place 1 application into the nose 2 (two) times daily.   nitroGLYCERIN 0.4 MG SL tablet Commonly known as: NITROSTAT Place 0.4 mg under the tongue every 5 (five) minutes as needed for chest pain.   pantoprazole 40 MG tablet Commonly known as: PROTONIX Take 1 tablet (40 mg total) by mouth daily.   Probiotic 250 MG Caps Take 1 capsule by mouth 2 times daily at 12 noon and 4 pm. What changed: when to take this   Saline Gel Place 3 sprays into the nose 3 (three) times daily.   zinc sulfate 220 (50 Zn) MG capsule Take 1 capsule (220 mg total) by mouth daily.       Follow-up Information    [facility], [place]. Schedule an appointment as soon as possible for a visit.   Specialty: [place] information: [place] [id number] [phone]              Allergies  Allergen Reactions  . Bee Venom Swelling  . Enalapril Maleate Swelling    Consultations:  None  Procedures/Studies: DG Chest Portable 1 View  Result Date: 09/25/2019 CLINICAL DATA:  Sepsis EXAM: PORTABLE CHEST 1 VIEW COMPARISON:  06/19/2019 radiograph, CT 12/20/2018 FINDINGS: Small right-sided pleural effusion or pleural thickening. Coarse pulmonary interstitium likely due to chronic change. Minimal streaky atelectasis or scar at the right base. Stable enlarged cardiomediastinal silhouette with aortic atherosclerosis. No pneumothorax. IMPRESSION: Small right-sided pleural effusion  or pleural thickening with streaky atelectasis or scar at the right base. Electronically Signed   By: Donavan Foil M.D.   On: 09/25/2019 00:22    Subjective: Patient was feeling better when seen today.  She was upset that I told her yesterday that she is going back home and she is still here.  Discharge Exam: Vitals:   09/27/19 0610 09/27/19 0830  BP: 119/72 122/71  Pulse: 80 78  Resp: 18 20  Temp: 98.6 F (37 C) 99.4 F (37.4 C)  SpO2: 99% 99%   Vitals:   09/26/19 2023 09/26/19 2350 09/27/19 0610 09/27/19 0830  BP: 132/82 121/75 119/72 122/71  Pulse: 80 91 80 78  Resp: 18 18 18 20   Temp: 98.2 F (36.8 C) 99.1 F (37.3 C) 98.6 F (37 C) 99.4 F (37.4 C)  TempSrc: Oral Oral Oral Oral  SpO2: 99% 97% 99% 99%  Weight:      Height:        General: Pt is alert, awake, not in acute distress Cardiovascular: RRR, S1/S2 +, no rubs, no  gallops Respiratory: CTA bilaterally, no wheezing, no rhonchi Abdominal: Soft, NT, ND, bowel sounds + Extremities: no edema, no cyanosis   The results of significant diagnostics from this hospitalization (including imaging, microbiology, ancillary and laboratory) are listed below for reference.    Microbiology: Recent Results (from the past 240 hour(s))  Resp Panel by RT PCR (RSV, Flu A&B, Covid) - Nasopharyngeal Swab     Status: None   Collection Time: 09/24/19 11:45 PM   Specimen: Nasopharyngeal Swab  Result Value Ref Range Status   SARS Coronavirus 2 by RT PCR NEGATIVE NEGATIVE Final    Comment: (NOTE) SARS-CoV-2 target nucleic acids are NOT DETECTED.  The SARS-CoV-2 RNA is generally detectable in upper respiratoy specimens during the acute phase of infection. The lowest concentration of SARS-CoV-2 viral copies this assay can detect is 131 copies/mL. A negative result does not preclude SARS-Cov-2 infection and should not be used as the sole basis for treatment or other patient management decisions. A negative result may occur with   improper specimen collection/handling, submission of specimen other than nasopharyngeal swab, presence of viral mutation(s) within the areas targeted by this assay, and inadequate number of viral copies (<131 copies/mL). A negative result must be combined with clinical observations, patient history, and epidemiological information. The expected result is Negative.  Fact Sheet for Patients:  PinkCheek.be  Fact Sheet for Healthcare Providers:  GravelBags.it  This test is no t yet approved or cleared by the [place] FDA and  has been authorized for detection and/or diagnosis of SARS-CoV-2 by FDA under an Emergency Use Authorization (EUA). This EUA will remain  in effect (meaning this test can be used) for the duration of the COVID-19 declaration under Section 564(b)(1) of the Act, 21 U.S.C. section 360bbb-3(b)(1), unless the authorization is terminated or revoked sooner.     Influenza A by PCR NEGATIVE NEGATIVE Final   Influenza B by PCR NEGATIVE NEGATIVE Final    Comment: (NOTE) The Xpert Xpress SARS-CoV-2/FLU/RSV assay is intended as an aid in  the diagnosis of influenza from Nasopharyngeal swab specimens and  should not be used as a sole basis for treatment. Nasal washings and  aspirates are unacceptable for Xpert Xpress SARS-CoV-2/FLU/RSV  testing.  Fact Sheet for Patients: PinkCheek.be  Fact Sheet for Healthcare Providers: GravelBags.it  This test is not yet approved or cleared by the [place] FDA and  has been authorized for detection and/or diagnosis of SARS-CoV-2 by  FDA under an Emergency Use Authorization (EUA). This EUA will remain  in effect (meaning this test can be used) for the duration of the  Covid-19 declaration under Section 564(b)(1) of the Act, 21  U.S.C. section 360bbb-3(b)(1), unless the authorization is  terminated or revoked.     Respiratory Syncytial Virus by PCR NEGATIVE NEGATIVE Final    Comment: (NOTE) Fact Sheet for Patients: PinkCheek.be  Fact Sheet for Healthcare Providers: GravelBags.it  This test is not yet approved or cleared by the [place] FDA and  has been authorized for detection and/or diagnosis of SARS-CoV-2 by  FDA under an Emergency Use Authorization (EUA). This EUA will remain  in effect (meaning this test can be used) for the duration of the  COVID-19 declaration under Section 564(b)(1) of the Act, 21 U.S.C.  section 360bbb-3(b)(1), unless the authorization is terminated or  revoked. Performed at [facility], [address] [place], [place] [address]   Blood culture (routine x 2)     Status: None (Preliminary result)   Collection  Time: 09/24/19 11:48 PM   Specimen: BLOOD  Result Value Ref Range Status   Specimen Description BLOOD BLOOD LEFT FOREARM  Final   Special Requests   Final    BOTTLES DRAWN AEROBIC AND ANAEROBIC Blood Culture results may not be optimal due to an inadequate volume of blood received in culture bottles   Culture   Final    NO GROWTH 2 DAYS Performed at [facility], [address], [place], [place] [address]    Report Status PENDING  Incomplete  Urine culture     Status: Abnormal (Preliminary result)   Collection Time: 09/24/19 11:49 PM   Specimen: Urine, Random  Result Value Ref Range Status   Specimen Description   Final    URINE, RANDOM Performed at [facility], [address], [place], [place] [address]    Special Requests   Final    NONE Performed at [facility], [address] [place], [place] [address]    Culture (A)  Final    >=100,000 COLONIES/mL ESCHERICHIA COLI Confirmed Extended Spectrum Beta-Lactamase Producer (ESBL).  In bloodstream infections from ESBL organisms, carbapenems are preferred over piperacillin/tazobactam. They are shown  to have a lower risk of mortality.    Report Status PENDING  Incomplete   Organism ID, Bacteria ESCHERICHIA COLI (A)  Final      Susceptibility   Escherichia coli - MIC*    AMPICILLIN >=32 RESISTANT Resistant     CEFAZOLIN >=64 RESISTANT Resistant     CEFTRIAXONE >=64 RESISTANT Resistant     CIPROFLOXACIN >=4 RESISTANT Resistant     GENTAMICIN >=16 RESISTANT Resistant     IMIPENEM <=0.25 SENSITIVE Sensitive     NITROFURANTOIN <=16 SENSITIVE Sensitive     TRIMETH/SULFA >=320 RESISTANT Resistant     AMPICILLIN/SULBACTAM 16 INTERMEDIATE Intermediate     PIP/TAZO <=4 SENSITIVE Sensitive     * >=100,000 COLONIES/mL ESCHERICHIA COLI  Blood culture (routine x 2)     Status: None (Preliminary result)   Collection Time: 09/24/19 11:53 PM   Specimen: BLOOD  Result Value Ref Range Status   Specimen Description BLOOD BLOOD LEFT HAND  Final   Special Requests   Final    BOTTLES DRAWN AEROBIC AND ANAEROBIC Blood Culture adequate volume   Culture   Final    NO GROWTH 2 DAYS Performed at [facility], [address] [place], [place] [address]    Report Status PENDING  Incomplete  MRSA PCR Screening     Status: Abnormal   Collection Time: 09/25/19  5:37 PM   Specimen: Nasal Mucosa; Nasopharyngeal  Result Value Ref Range Status   MRSA by PCR POSITIVE (A) NEGATIVE Final    Comment:        The GeneXpert MRSA Assay (FDA approved for NASAL specimens only), is one component of a comprehensive MRSA colonization surveillance program. It is not intended to diagnose MRSA infection nor to guide or monitor treatment for MRSA infections. RESULT CALLED TO, READ BACK BY AND VERIFIED WITH: CHRIS BELL 09/25/19 AT 1928 BY ACR Performed at [facility], [address] [place], [place] [address]      Labs: BNP (last 3 results) Recent Labs    02/11/19 0945 02/14/19 1109 06/19/19 0137  BNP 76.0 117.0* 875.7*   Basic Metabolic Panel: Recent Labs  Lab 09/24/19 2348  09/25/19 0306 09/27/19 0827  NA 138 140 140  K 4.9 4.7 4.4  CL 103 106 109  CO2 26 26 26   GLUCOSE  101* 95 92  BUN 30* 29* 20  CREATININE 1.82* 1.76* 1.31*  CALCIUM 10.1 9.9 10.2   Liver Function Tests: Recent Labs  Lab 09/24/19 2348  AST 37  ALT 25  ALKPHOS 147*  BILITOT 0.8  PROT 7.3  ALBUMIN 2.5*   No results for input(s): LIPASE, AMYLASE in the last 168 hours. No results for input(s): AMMONIA in the last 168 hours. CBC: Recent Labs  Lab 09/24/19 2348 09/25/19 0306 09/26/19 1348 09/27/19 0827  WBC 11.3* 13.3* 5.7 4.3  NEUTROABS 8.8*  --   --   --   HGB 10.7* 8.5* 9.0* 8.5*  HCT 33.8* 25.6* 27.4* 27.1*  MCV 92.1 90.5 91.3 93.8  PLT 225 238 173 176   Cardiac Enzymes: No results for input(s): CKTOTAL, CKMB, CKMBINDEX, TROPONINI in the last 168 hours. BNP: Invalid input(s): POCBNP CBG: Recent Labs  Lab 09/26/19 0739 09/26/19 1119 09/26/19 1638 09/26/19 2203 09/27/19 0956  GLUCAP 76 108* 97 96 91   D-Dimer No results for input(s): DDIMER in the last 72 hours. Hgb A1c Recent Labs    09/24/19 2348  HGBA1C 5.1   Lipid Profile No results for input(s): CHOL, HDL, LDLCALC, TRIG, CHOLHDL, LDLDIRECT in the last 72 hours. Thyroid function studies No results for input(s): TSH, T4TOTAL, T3FREE, THYROIDAB in the last 72 hours.  Invalid input(s): FREET3 Anemia work up No results for input(s): VITAMINB12, FOLATE, FERRITIN, TIBC, IRON, RETICCTPCT in the last 72 hours. Urinalysis    Component Value Date/Time   COLORURINE YELLOW (A) 09/24/2019 2348   APPEARANCEUR TURBID (A) 09/24/2019 2348   APPEARANCEUR Clear 02/20/2013 1459   LABSPEC 1.013 09/24/2019 2348   LABSPEC 1.012 02/20/2013 1459   PHURINE 5.0 09/24/2019 2348   GLUCOSEU NEGATIVE 09/24/2019 2348   GLUCOSEU Negative 02/20/2013 1459   HGBUR LARGE (A) 09/24/2019 2348   BILIRUBINUR NEGATIVE 09/24/2019 2348   BILIRUBINUR Negative 02/20/2013 1459   [place] NEGATIVE 09/24/2019 2348   PROTEINUR 30 (A)  09/24/2019 2348   NITRITE NEGATIVE 09/24/2019 2348   LEUKOCYTESUR LARGE (A) 09/24/2019 2348   LEUKOCYTESUR Negative 02/20/2013 1459   Sepsis Labs Invalid input(s): PROCALCITONIN,  WBC,  LACTICIDVEN Microbiology Recent Results (from the past 240 hour(s))  Resp Panel by RT PCR (RSV, Flu A&B, Covid) - Nasopharyngeal Swab     Status: None   Collection Time: 09/24/19 11:45 PM   Specimen: Nasopharyngeal Swab  Result Value Ref Range Status   SARS Coronavirus 2 by RT PCR NEGATIVE NEGATIVE Final    Comment: (NOTE) SARS-CoV-2 target nucleic acids are NOT DETECTED.  The SARS-CoV-2 RNA is generally detectable in upper respiratoy specimens during the acute phase of infection. The lowest concentration of SARS-CoV-2 viral copies this assay can detect is 131 copies/mL. A negative result does not preclude SARS-Cov-2 infection and should not be used as the sole basis for treatment or other patient management decisions. A negative result may occur with  improper specimen collection/handling, submission of specimen other than nasopharyngeal swab, presence of viral mutation(s) within the areas targeted by this assay, and inadequate number of viral copies (<131 copies/mL). A negative result must be combined with clinical observations, patient history, and epidemiological information. The expected result is Negative.  Fact Sheet for Patients:  PinkCheek.be  Fact Sheet for Healthcare Providers:  GravelBags.it  This test is no t yet approved or cleared by the [place] FDA and  has been authorized for detection and/or diagnosis of SARS-CoV-2 by FDA under an Emergency Use Authorization (EUA). This EUA will  remain  in effect (meaning this test can be used) for the duration of the COVID-19 declaration under Section 564(b)(1) of the Act, 21 U.S.C. section 360bbb-3(b)(1), unless the authorization is terminated or revoked sooner.      Influenza A by PCR NEGATIVE NEGATIVE Final   Influenza B by PCR NEGATIVE NEGATIVE Final    Comment: (NOTE) The Xpert Xpress SARS-CoV-2/FLU/RSV assay is intended as an aid in  the diagnosis of influenza from Nasopharyngeal swab specimens and  should not be used as a sole basis for treatment. Nasal washings and  aspirates are unacceptable for Xpert Xpress SARS-CoV-2/FLU/RSV  testing.  Fact Sheet for Patients: PinkCheek.be  Fact Sheet for Healthcare Providers: GravelBags.it  This test is not yet approved or cleared by the [place] FDA and  has been authorized for detection and/or diagnosis of SARS-CoV-2 by  FDA under an Emergency Use Authorization (EUA). This EUA will remain  in effect (meaning this test can be used) for the duration of the  Covid-19 declaration under Section 564(b)(1) of the Act, 21  U.S.C. section 360bbb-3(b)(1), unless the authorization is  terminated or revoked.    Respiratory Syncytial Virus by PCR NEGATIVE NEGATIVE Final    Comment: (NOTE) Fact Sheet for Patients: PinkCheek.be  Fact Sheet for Healthcare Providers: GravelBags.it  This test is not yet approved or cleared by the [place] FDA and  has been authorized for detection and/or diagnosis of SARS-CoV-2 by  FDA under an Emergency Use Authorization (EUA). This EUA will remain  in effect (meaning this test can be used) for the duration of the  COVID-19 declaration under Section 564(b)(1) of the Act, 21 U.S.C.  section 360bbb-3(b)(1), unless the authorization is terminated or  revoked. Performed at [facility], [address] [place], [place] [address]   Blood culture (routine x 2)     Status: None (Preliminary result)   Collection Time: 09/24/19 11:48 PM   Specimen: BLOOD  Result Value Ref Range Status   Specimen Description BLOOD BLOOD LEFT FOREARM  Final   Special  Requests   Final    BOTTLES DRAWN AEROBIC AND ANAEROBIC Blood Culture results may not be optimal due to an inadequate volume of blood received in culture bottles   Culture   Final    NO GROWTH 2 DAYS Performed at [facility], [address] [place], [place] [address]    Report Status PENDING  Incomplete  Urine culture     Status: Abnormal (Preliminary result)   Collection Time: 09/24/19 11:49 PM   Specimen: Urine, Random  Result Value Ref Range Status   Specimen Description   Final    URINE, RANDOM Performed at [facility], [address], [place], [place] [address]    Special Requests   Final    NONE Performed at [facility], [address] [place], [place] [address]    Culture (A)  Final    >=100,000 COLONIES/mL ESCHERICHIA COLI Confirmed Extended Spectrum Beta-Lactamase Producer (ESBL).  In bloodstream infections from ESBL organisms, carbapenems are preferred over piperacillin/tazobactam. They are shown to have a lower risk of mortality.    Report Status PENDING  Incomplete   Organism ID, Bacteria ESCHERICHIA COLI (A)  Final      Susceptibility   Escherichia coli - MIC*    AMPICILLIN >=32 RESISTANT Resistant     CEFAZOLIN >=64 RESISTANT Resistant     CEFTRIAXONE >=64 RESISTANT Resistant     CIPROFLOXACIN >=4 RESISTANT Resistant  GENTAMICIN >=16 RESISTANT Resistant     IMIPENEM <=0.25 SENSITIVE Sensitive     NITROFURANTOIN <=16 SENSITIVE Sensitive     TRIMETH/SULFA >=320 RESISTANT Resistant     AMPICILLIN/SULBACTAM 16 INTERMEDIATE Intermediate     PIP/TAZO <=4 SENSITIVE Sensitive     * >=100,000 COLONIES/mL ESCHERICHIA COLI  Blood culture (routine x 2)     Status: None (Preliminary result)   Collection Time: 09/24/19 11:53 PM   Specimen: BLOOD  Result Value Ref Range Status   Specimen Description BLOOD BLOOD LEFT HAND  Final   Special Requests   Final    BOTTLES DRAWN AEROBIC AND ANAEROBIC Blood Culture adequate volume   Culture    Final    NO GROWTH 2 DAYS Performed at [facility], [address] [place], [place] [address]    Report Status PENDING  Incomplete  MRSA PCR Screening     Status: Abnormal   Collection Time: 09/25/19  5:37 PM   Specimen: Nasal Mucosa; Nasopharyngeal  Result Value Ref Range Status   MRSA by PCR POSITIVE (A) NEGATIVE Final    Comment:        The GeneXpert MRSA Assay (FDA approved for NASAL specimens only), is one component of a comprehensive MRSA colonization surveillance program. It is not intended to diagnose MRSA infection nor to guide or monitor treatment for MRSA infections. RESULT CALLED TO, READ BACK BY AND VERIFIED WITH: CHRIS BELL 09/25/19 AT 1928 BY ACR Performed at [facility], [address] [place], [place] [address]     Time coordinating discharge: Over 30 minutes  SIGNED:  Lorella Nimrod, MD  [facility] 09/28/2019.  If 7PM-7AM, please contact night-coverage [email or web address]  This record has been created using Systems analyst. Errors have been sought and corrected,but may not always be located. Such creation errors do not reflect on the standard of care.

## 2019-09-26 NOTE — Progress Notes (Signed)
Patient is long term resident at [facility]

## 2019-09-26 NOTE — Progress Notes (Signed)
PROGRESS NOTE    Katherine [name]  FOY:[id number] DOB: 11-01-1940 DOA: 09/24/2019 PCP: [facility]   Brief Narrative:  [id number] O30 y.o.African-American femalewith a known history of COPD, type diabetes mellitus, CHF, hypertension, GERD, CVA and seizureswho presented to the emergency room with acute onset of altered mental status as well as fever at her skilled nursing facility, with associated generalized weakness and chills. She admits to urinary frequency and urgency without dysuria or hematuria or flank pain. On presentation she met sepsis criteria with being febrile, leukocytosis, lactic acidosis, AKI and UA look infected. Blood cultures remain negative, urine cultures with ESBL E. Coli.  Clinically shows a good response on ceftriaxone although sensitivity shows resistance.  Subjective: Patient has no new complaint today.  She was feeling better.  Saturating well on her home oxygen of 2 L.  Assessment & Plan:   Active Problems:   Sepsis due to gram-negative UTI (HCC)  ESBL E. coli UTI.  Sepsis resolved.  Patient appears back to her baseline.  Blood cultures remain negative.  Patient has an history of ESBL UTI in the past.  Clinically seems responding to ceftriaxone although sensitivity results shows resistant.  Remained afebrile. She was given 1 dose of fosfomycin. After discussing with pharmacy we will give her 1 dose of meropenem. We will watch her for another day-if remains stable can be discharged tomorrow and fosfomycin dose can be repeated time 1 in 48 hours as an outpatient.  AKI with CKD stage IIIa.  Creatinine improving with IV fluid. -Continue to monitor. -Avoid nephrotoxins.  Chronic respiratory failure.  No acute concern.  Patient saturating well on her home oxygen of 2 L. -Continue with supplemental oxygen.  Seizure disorder.  No acute concern. -Continue home dose of Keppra and lacosamide.  Dyslipidemia. -Continue home dose of  statin.  Type 2 diabetes mellitus. -Continue home dose of Tradjenta. -SSI  GERD. -Continue PPI  Objective: Vitals:   09/26/19 0055 09/26/19 0536 09/26/19 0740 09/26/19 1135  BP: 130/68 111/68 102/62 122/81  Pulse: 91 93 90 96  Resp: 18 20 17 17   Temp: 99 F (37.2 C) 99 F (37.2 C) 98.3 F (36.8 C) 98.2 F (36.8 C)  TempSrc: Oral     SpO2: 100% 100% 99% 100%  Weight:      Height:        Intake/Output Summary (Last 24 hours) at 09/26/2019 1328 Last data filed at 09/26/2019 0500 Gross per 24 hour  Intake 800 ml  Output 1750 ml  Net -950 ml   Filed Weights   09/24/19 2349  Weight: 81.6 kg    Examination:  General exam: Chronically ill-appearing elderly lady, appears calm and comfortable  Respiratory system: Clear to auscultation. Respiratory effort normal. Cardiovascular system: S1 & S2 heard, RRR. No JVD, murmurs, Gastrointestinal system: Soft, nontender, nondistended, bowel sounds positive. Central nervous system: Alert and oriented. No focal neurological deficits Extremities: Trace LE edema, no cyanosis, pulses intact and symmetrical. Psychiatry: Judgement and insight appear impaired.  DVT prophylaxis: Lovenox Code Status: Full Family Communication:  Disposition Plan:  Status is: Inpatient  Remains inpatient appropriate because:Inpatient level of care appropriate due to severity of illness   Dispo: The patient is from: SNF              Anticipated d/c is to: SNF              Anticipated d/c date is: 1 day  Patient currently is not medically stable to d/c.  Consultants:   None  Procedures:  Antimicrobials: Meropenem  Data Reviewed: I have personally reviewed following labs and imaging studies  CBC: Recent Labs  Lab 09/24/19 2348 09/25/19 0306  WBC 11.3* 13.3*  NEUTROABS 8.8*  --   HGB 10.7* 8.5*  HCT 33.8* 25.6*  MCV 92.1 90.5  PLT 225 462   Basic Metabolic Panel: Recent Labs  Lab 09/24/19 2348 09/25/19 0306  NA 138 140   K 4.9 4.7  CL 103 106  CO2 26 26  GLUCOSE 101* 95  BUN 30* 29*  CREATININE 1.82* 1.76*  CALCIUM 10.1 9.9   GFR: Estimated Creatinine Clearance: 25.3 mL/min (A) (by C-G formula based on SCr of 1.76 mg/dL (H)). Liver Function Tests: Recent Labs  Lab 09/24/19 2348  AST 37  ALT 25  ALKPHOS 147*  BILITOT 0.8  PROT 7.3  ALBUMIN 2.5*   No results for input(s): LIPASE, AMYLASE in the last 168 hours. No results for input(s): AMMONIA in the last 168 hours. Coagulation Profile: Recent Labs  Lab 09/24/19 2348 09/25/19 0306  INR 1.1 1.1   Cardiac Enzymes: No results for input(s): CKTOTAL, CKMB, CKMBINDEX, TROPONINI in the last 168 hours. BNP (last 3 results) No results for input(s): PROBNP in the last 8760 hours. HbA1C: Recent Labs    09/24/19 2348  HGBA1C 5.1   CBG: Recent Labs  Lab 09/25/19 1257 09/25/19 1604 09/26/19 0739 09/26/19 1119  GLUCAP 77 86 76 108*   Lipid Profile: No results for input(s): CHOL, HDL, LDLCALC, TRIG, CHOLHDL, LDLDIRECT in the last 72 hours. Thyroid Function Tests: No results for input(s): TSH, T4TOTAL, FREET4, T3FREE, THYROIDAB in the last 72 hours. Anemia Panel: No results for input(s): VITAMINB12, FOLATE, FERRITIN, TIBC, IRON, RETICCTPCT in the last 72 hours. Sepsis Labs: Recent Labs  Lab 09/24/19 2348 09/25/19 0306 09/25/19 0830 09/26/19 0613  PROCALCITON 0.19 0.19  --  0.20  LATICACIDVEN 1.4 2.4* 1.0  --     Recent Results (from the past 240 hour(s))  Resp Panel by RT PCR (RSV, Flu A&B, Covid) - Nasopharyngeal Swab     Status: None   Collection Time: 09/24/19 11:45 PM   Specimen: Nasopharyngeal Swab  Result Value Ref Range Status   SARS Coronavirus 2 by RT PCR NEGATIVE NEGATIVE Final    Comment: (NOTE) SARS-CoV-2 target nucleic acids are NOT DETECTED.  The SARS-CoV-2 RNA is generally detectable in upper respiratoy specimens during the acute phase of infection. The lowest concentration of SARS-CoV-2 viral copies this  assay can detect is 131 copies/mL. A negative result does not preclude SARS-Cov-2 infection and should not be used as the sole basis for treatment or other patient management decisions. A negative result may occur with  improper specimen collection/handling, submission of specimen other than nasopharyngeal swab, presence of viral mutation(s) within the areas targeted by this assay, and inadequate number of viral copies (<131 copies/mL). A negative result must be combined with clinical observations, patient history, and epidemiological information. The expected result is Negative.  Fact Sheet for Patients:  PinkCheek.be  Fact Sheet for Healthcare Providers:  GravelBags.it  This test is no t yet approved or cleared by the [place] FDA and  has been authorized for detection and/or diagnosis of SARS-CoV-2 by FDA under an Emergency Use Authorization (EUA). This EUA will remain  in effect (meaning this test can be used) for the duration of the COVID-19 declaration under Section 564(b)(1) of the Act, 21 U.S.C. section  360bbb-3(b)(1), unless the authorization is terminated or revoked sooner.     Influenza A by PCR NEGATIVE NEGATIVE Final   Influenza B by PCR NEGATIVE NEGATIVE Final    Comment: (NOTE) The Xpert Xpress SARS-CoV-2/FLU/RSV assay is intended as an aid in  the diagnosis of influenza from Nasopharyngeal swab specimens and  should not be used as a sole basis for treatment. Nasal washings and  aspirates are unacceptable for Xpert Xpress SARS-CoV-2/FLU/RSV  testing.  Fact Sheet for Patients: PinkCheek.be  Fact Sheet for Healthcare Providers: GravelBags.it  This test is not yet approved or cleared by the [place] FDA and  has been authorized for detection and/or diagnosis of SARS-CoV-2 by  FDA under an Emergency Use Authorization (EUA). This EUA will  remain  in effect (meaning this test can be used) for the duration of the  Covid-19 declaration under Section 564(b)(1) of the Act, 21  U.S.C. section 360bbb-3(b)(1), unless the authorization is  terminated or revoked.    Respiratory Syncytial Virus by PCR NEGATIVE NEGATIVE Final    Comment: (NOTE) Fact Sheet for Patients: PinkCheek.be  Fact Sheet for Healthcare Providers: GravelBags.it  This test is not yet approved or cleared by the [place] FDA and  has been authorized for detection and/or diagnosis of SARS-CoV-2 by  FDA under an Emergency Use Authorization (EUA). This EUA will remain  in effect (meaning this test can be used) for the duration of the  COVID-19 declaration under Section 564(b)(1) of the Act, 21 U.S.C.  section 360bbb-3(b)(1), unless the authorization is terminated or  revoked. Performed at [facility], [address] [place], [place] [address]   Blood culture (routine x 2)     Status: None (Preliminary result)   Collection Time: 09/24/19 11:48 PM   Specimen: BLOOD  Result Value Ref Range Status   Specimen Description BLOOD BLOOD LEFT FOREARM  Final   Special Requests   Final    BOTTLES DRAWN AEROBIC AND ANAEROBIC Blood Culture results may not be optimal due to an inadequate volume of blood received in culture bottles   Culture   Final    NO GROWTH 1 DAY Performed at [facility], [address], [address] [place], [place] [address]    Report Status PENDING  Incomplete  Urine culture     Status: Abnormal (Preliminary result)   Collection Time: 09/24/19 11:49 PM   Specimen: Urine, Random  Result Value Ref Range Status   Specimen Description   Final    URINE, RANDOM Performed at [facility], [address], [place], [place] [address]    Special Requests   Final    NONE Performed at [facility], [address] [place], [place] [address]    Culture (A)  Final     >=100,000 COLONIES/mL ESCHERICHIA COLI Confirmed Extended Spectrum Beta-Lactamase Producer (ESBL).  In bloodstream infections from ESBL organisms, carbapenems are preferred over piperacillin/tazobactam. They are shown to have a lower risk of mortality.    Report Status PENDING  Incomplete   Organism ID, Bacteria ESCHERICHIA COLI (A)  Final      Susceptibility   Escherichia coli - MIC*    AMPICILLIN >=32 RESISTANT Resistant     CEFAZOLIN >=64 RESISTANT Resistant     CEFTRIAXONE >=64 RESISTANT Resistant     CIPROFLOXACIN >=4 RESISTANT Resistant     GENTAMICIN >=16 RESISTANT Resistant     IMIPENEM <=0.25 SENSITIVE Sensitive     NITROFURANTOIN <=16 SENSITIVE Sensitive     TRIMETH/SULFA >=320  RESISTANT Resistant     AMPICILLIN/SULBACTAM 16 INTERMEDIATE Intermediate     PIP/TAZO <=4 SENSITIVE Sensitive     * >=100,000 COLONIES/mL ESCHERICHIA COLI  Blood culture (routine x 2)     Status: None (Preliminary result)   Collection Time: 09/24/19 11:53 PM   Specimen: BLOOD  Result Value Ref Range Status   Specimen Description BLOOD BLOOD LEFT HAND  Final   Special Requests   Final    BOTTLES DRAWN AEROBIC AND ANAEROBIC Blood Culture adequate volume   Culture   Final    NO GROWTH 1 DAY Performed at [facility], [address] [place], [place] [address]    Report Status PENDING  Incomplete  MRSA PCR Screening     Status: Abnormal   Collection Time: 09/25/19  5:37 PM   Specimen: Nasal Mucosa; Nasopharyngeal  Result Value Ref Range Status   MRSA by PCR POSITIVE (A) NEGATIVE Final    Comment:        The GeneXpert MRSA Assay (FDA approved for NASAL specimens only), is one component of a comprehensive MRSA colonization surveillance program. It is not intended to diagnose MRSA infection nor to guide or monitor treatment for MRSA infections. RESULT CALLED TO, READ BACK BY AND VERIFIED WITH: CHRIS BELL 09/25/19 AT 1928 BY ACR Performed at [facility], [address], [place], [place] [address]      Radiology Studies: DG Chest Portable 1 View  Result Date: 09/25/2019 CLINICAL DATA:  Sepsis EXAM: PORTABLE CHEST 1 VIEW COMPARISON:  06/19/2019 radiograph, CT 12/20/2018 FINDINGS: Small right-sided pleural effusion or pleural thickening. Coarse pulmonary interstitium likely due to chronic change. Minimal streaky atelectasis or scar at the right base. Stable enlarged cardiomediastinal silhouette with aortic atherosclerosis. No pneumothorax. IMPRESSION: Small right-sided pleural effusion or pleural thickening with streaky atelectasis or scar at the right base. Electronically Signed   By: Donavan Foil M.D.   On: 09/25/2019 00:22    Scheduled Meds:  acetaminophen  325 mg Rectal Once   acidophilus  1 capsule Oral BID   ascorbic acid  500 mg Oral Daily   atorvastatin  80 mg Oral QPM   Chlorhexidine Gluconate Cloth  6 each Topical Q0600   cholecalciferol  1,000 Units Oral Daily   docusate sodium  100 mg Oral BID   enoxaparin (LOVENOX) injection  30 mg Subcutaneous Q24H   ferrous sulfate  325 mg Oral BID   gabapentin  300 mg Oral QHS   insulin aspart  0-15 Units Subcutaneous TID WC   insulin aspart  0-5 Units Subcutaneous QHS   lacosamide  100 mg Oral BID   levETIRAcetam  750 mg Oral Daily   linagliptin  5 mg Oral Daily   mupirocin ointment  1 application Nasal BID   pantoprazole  40 mg Oral Daily   zinc sulfate  220 mg Oral Daily   Continuous Infusions:  sodium chloride 100 mL/hr at 09/25/19 1800   cefTRIAXone (ROCEPHIN)  IV 1 g (09/26/19 1034)     LOS: 1 day   Time spent: 40 minutes.  Lorella Nimrod, MD [facility]  If 7PM-7AM, please contact night-coverage [email or web address]  09/26/2019, 1:28 PM   This record has been created using Systems analyst. Errors have been sought and corrected,but may not always be located. Such creation errors do not reflect on the standard of care.

## 2019-09-26 NOTE — Consult Note (Signed)
Pharmacy Antibiotic Note  Katherine [name] is a 79 y.o. female admitted on 09/24/2019 with UTI. Patient was empirically treated with ceftriaxone and appears to be clinically improving. However, urine culture subsequently resulted ESBL E. coli which patient has a history of. Patient received one dose of fosfomycin on 8/13. Pharmacy has been consulted for meropenem dosing.   Plan: Meropenem 1 g q12h (renally adjusted)  Height: 5' (152.4 cm) Weight: 81.6 kg (179 lb 14.3 oz) IBW/kg (Calculated) : 45.5  Temp (24hrs), Avg:98.5 F (36.9 C), Min:97.9 F (36.6 C), Max:99 F (37.2 C)  Recent Labs  Lab 09/24/19 2348 09/25/19 0306 09/25/19 0830  WBC 11.3* 13.3*  --   CREATININE 1.82* 1.76*  --   LATICACIDVEN 1.4 2.4* 1.0    Estimated Creatinine Clearance: 25.3 mL/min (A) (by C-G formula based on SCr of 1.76 mg/dL (H)).    Allergies  Allergen Reactions  . Bee Venom Swelling  . Enalapril Maleate Swelling    Antimicrobials this admission: Vancomycin + cefepime x 1 in ED on 8/12  Ceftriaxone 8/12 >> 8/13 Meropenem 8/13 >>   Dose adjustments this admission: n/a  Microbiology results: 8/11 BCx: NG x 1 day 8/11 UCx: ESBL E coli  8/12 MRSA PCR: positive  Thank you for allowing pharmacy to be a part of this patient's care.  Benita Gutter 09/26/2019 1:30 PM

## 2019-09-27 DIAGNOSIS — A4150 Gram-negative sepsis, unspecified: Secondary | ICD-10-CM | POA: Diagnosis not present

## 2019-09-27 DIAGNOSIS — N390 Urinary tract infection, site not specified: Secondary | ICD-10-CM | POA: Diagnosis not present

## 2019-09-27 DIAGNOSIS — N3001 Acute cystitis with hematuria: Secondary | ICD-10-CM | POA: Diagnosis not present

## 2019-09-27 LAB — GLUCOSE, CAPILLARY
Glucose-Capillary: 91 mg/dL (ref 70–99)
Glucose-Capillary: 95 mg/dL (ref 70–99)
Glucose-Capillary: 78 mg/dL (ref 70–99)

## 2019-09-27 LAB — BASIC METABOLIC PANEL
Anion gap: 5 (ref 5–15)
BUN: 20 mg/dL (ref 8–23)
CO2: 26 mmol/L (ref 22–32)
Calcium: 10.2 mg/dL (ref 8.9–10.3)
Chloride: 109 mmol/L (ref 98–111)
Creatinine, Ser: 1.31 mg/dL — ABNORMAL HIGH (ref 0.44–1.00)
GFR calc Af Amer: 45 mL/min — ABNORMAL LOW (ref >60)
GFR calc non Af Amer: 39 mL/min — ABNORMAL LOW (ref >60)
Glucose, Bld: 92 mg/dL (ref 70–99)
Potassium: 4.4 mmol/L (ref 3.5–5.1)
Sodium: 140 mmol/L (ref 135–145)

## 2019-09-27 LAB — CBC
HCT: 27.1 % — ABNORMAL LOW (ref 36.0–46.0)
Hemoglobin: 8.5 g/dL — ABNORMAL LOW (ref 12.0–15.0)
MCH: 29.4 pg (ref 26.0–34.0)
MCHC: 31.4 g/dL (ref 30.0–36.0)
MCV: 93.8 fL (ref 80.0–100.0)
Platelets: 176 10*3/uL (ref 150–400)
RBC: 2.89 MIL/uL — ABNORMAL LOW (ref 3.87–5.11)
RDW: 14.5 % (ref 11.5–15.5)
WBC: 4.3 10*3/uL (ref 4.0–10.5)
nRBC: 0 % (ref 0.0–0.2)

## 2019-09-27 LAB — URINE CULTURE: Culture: >=100000 — AB

## 2019-09-27 MED ORDER — FOSFOMYCIN TROMETHAMINE 3 G PO PACK: 3.0000 g | PACK | Freq: Once | ORAL | 0 refills | Status: DC

## 2019-09-27 MED ORDER — LIDOCAINE 5 % EX PTCH: 1.0000 | MEDICATED_PATCH | Freq: Two times a day (BID) | CUTANEOUS | 0 refills | Status: DC | PRN

## 2019-09-27 MED ORDER — ENOXAPARIN SODIUM 40 MG/0.4ML ~~LOC~~ SOLN
40.0000 mg | SUBCUTANEOUS | Status: DC
  Administered 2019-09-28: 11:00:00 40 mg via SUBCUTANEOUS
  Filled 2019-09-27: qty 0.4

## 2019-09-27 NOTE — Progress Notes (Signed)
PHARMACIST - PHYSICIAN COMMUNICATION  CONCERNING:  Enoxaparin (Lovenox) for DVT Prophylaxis    RECOMMENDATION: Patient was prescribed enoxaprin 30mg  q24 hours for VTE prophylaxis.   Filed Weights   09/24/19 2349  Weight: 81.6 kg (179 lb 14.3 oz)    Body mass index is 35.13 kg/m.  Estimated Creatinine Clearance: 34 mL/min (A) (by C-G formula based on SCr of 1.31 mg/dL (H)).   Patient is candidate for enoxaparin 40mg  every 24 hours based on CrCl >16ml/min AND Weight <45kg  DESCRIPTION: Pharmacy has adjusted enoxaparin dose per [facility] policy.  Patient is now receiving enoxaparin 40mg  every 24 hours.   Lu Duffel, PharmD, BCPS Clinical Pharmacist 09/27/2019 11:19 AM

## 2019-09-28 DIAGNOSIS — A419 Sepsis, unspecified organism: Secondary | ICD-10-CM | POA: Diagnosis not present

## 2019-09-28 DIAGNOSIS — N3001 Acute cystitis with hematuria: Secondary | ICD-10-CM | POA: Diagnosis not present

## 2019-09-28 DIAGNOSIS — R6520 Severe sepsis without septic shock: Secondary | ICD-10-CM | POA: Diagnosis not present

## 2019-09-28 DIAGNOSIS — A4150 Gram-negative sepsis, unspecified: Secondary | ICD-10-CM | POA: Diagnosis not present

## 2019-09-28 LAB — GLUCOSE, CAPILLARY
Glucose-Capillary: 84 mg/dL (ref 70–99)
Glucose-Capillary: 83 mg/dL (ref 70–99)

## 2019-09-28 MED ORDER — FOSFOMYCIN TROMETHAMINE 3 G PO PACK
3.0000 g | PACK | Freq: Once | ORAL | Status: AC
  Administered 2019-09-28: 14:00:00 3 g via ORAL
  Filled 2019-09-28: qty 3

## 2019-09-28 NOTE — TOC Transition Note (Signed)
Transition of Care [facility]) - CM/SW Discharge Note   Patient Details  Name: Katherine [name] MRN: [id number] Date of Birth: April 07, 1940  [facility] CM/SW Contact:  Boris Sharper, LCSW Phone Number: 09/28/2019, 2:39 PM   Clinical Narrative:    Pt is medically stable for discharge per MD. Pt will be transported back to [facility] via EMS. CSW will arrange transport once nursing staff ready. CSW notified pt's spouse of discharge. Pt will be going to room 14B call to report number is [phone].         Patient Goals and CMS Choice        Discharge Placement                       Discharge Plan and Services                                     Social Determinants of Health (SDOH) Interventions     Readmission Risk Interventions Readmission Risk Prevention Plan 09/26/2019 11/26/2018 11/22/2018  Transportation Screening Complete Complete Complete  Medication Review Press photographer) Complete Complete Complete  PCP or Specialist appointment within 3-5 days of discharge - (No Data) -  PCP/Specialist Appt Not Complete comments - - -  HRI or [place] - (No Data) -  SW Recovery Care/Counseling Consult Complete Complete -  SW Consult Not Complete Comments - - -  Palliative Care Screening Not Applicable - -  Medication Reconcilation ([place] Not Applicable Complete Complete  Some recent data might be hidden

## 2019-09-28 NOTE — Progress Notes (Signed)
PROGRESS NOTE    Katherine [name]  YQM:[id number] DOB: [birth year] DOA: 09/24/2019 PCP: [facility] [place]   Brief Narrative:  [id number] Z37 y.o.African-American femalewith a known history of COPD, type diabetes mellitus, CHF, hypertension, GERD, CVA and seizureswho presented to the emergency room with acute onset of altered mental status as well as fever at her skilled nursing facility, with associated generalized weakness and chills. She admits to urinary frequency and urgency without dysuria or hematuria or flank pain. On presentation she met sepsis criteria with being febrile, leukocytosis, lactic acidosis, AKI and UA look infected. Blood cultures remain negative, urine cultures with ESBL E. Coli.  Clinically shows a good response on ceftriaxone although sensitivity shows resistance. It was discharged yesterday, apparently required insurance reauthorization although she was living in a long-term facility.  Subjective: Patient has no new complaint today.  She was feeling better.  Saturating well on her home oxygen of 2 L.  Wants to go back to her facility.,  She was upset that I told her yesterday that she is going back home and she is still here.  Assessment & Plan:   Active Problems:   Sepsis due to gram-negative UTI (HCC)  ESBL E. coli UTI.  Sepsis resolved.  Patient appears back to her baseline.  Blood cultures remain negative.  Patient has an history of ESBL UTI in the past.  Clinically seems responding to ceftriaxone although sensitivity results shows resistant.  Remained afebrile. She was given 1 dose of fosfomycin and started on meropenem, received meropenem for 2 days. -I will discontinue meropenem and give her a second dose of fosfomycin.  Foley catheter.  Patient with Foley catheter, not sure the duration.  When asked patient she told me that she was using diapers before.  May be inserted during current hospitalization, unable to find a proper documentation. -We  will remove Foley and give her a voiding trial.  AKI with CKD stage IIIa.  Creatinine improving with IV fluid. -Continue to monitor. -Avoid nephrotoxins.  Chronic respiratory failure.  No acute concern.  Patient saturating well on her home oxygen of 2 L. -Continue with supplemental oxygen.  Seizure disorder.  No acute concern. -Continue home dose of Keppra and lacosamide.  Dyslipidemia. -Continue home dose of statin.  Type 2 diabetes mellitus. -Continue home dose of Tradjenta. -SSI  GERD. -Continue PPI  Objective: Vitals:   09/27/19 1330 09/27/19 2030 09/28/19 0123 09/28/19 0543  BP: 126/79 140/84 131/77 130/76  Pulse: 71 75 77 76  Resp: '14 18 18 16  '$ Temp: 97.8 F (36.6 C) (!) 97.5 F (36.4 C) 98.1 F (36.7 C) 98.8 F (37.1 C)  TempSrc: Oral Oral Oral Oral  SpO2: 100% 100% 100% 100%  Weight:      Height:        Intake/Output Summary (Last 24 hours) at 09/28/2019 1334 Last data filed at 09/28/2019 0143 Gross per 24 hour  Intake --  Output 1750 ml  Net -1750 ml   Filed Weights   09/24/19 2349  Weight: 81.6 kg    Examination:  General.  In no acute distress. Pulmonary.  Lungs clear bilaterally, normal respiratory effort. CV.  Regular rate and rhythm, no JVD, rub or murmur. Abdomen.  Soft, nontender, nondistended, BS positive. CNS.  Alert and oriented x3.  No focal neurologic deficit. Extremities.  No edema, no cyanosis, pulses intact and symmetrical. Psychiatry.  Judgment and insight appears impaired.  DVT prophylaxis: Lovenox Code Status: Full Family Communication: No family at bedside. Disposition Plan:  Status is: Inpatient  Remains inpatient appropriate because:Inpatient level of care appropriate due to severity of illness   Dispo: The patient is from: SNF              Anticipated d/c is to: SNF              Anticipated d/c date is: 1 day              Patient currently is medically stable.  Discharge yesterday, apparently needs another insurance  authorization for going back to her long-term facility.  Most likely will occur tomorrow.  Consultants:   None  Procedures:  Antimicrobials: Fosfomycin  Data Reviewed: I have personally reviewed following labs and imaging studies  CBC: Recent Labs  Lab 09/24/19 2348 09/25/19 0306 09/26/19 1348 09/27/19 0827  WBC 11.3* 13.3* 5.7 4.3  NEUTROABS 8.8*  --   --   --   HGB 10.7* 8.5* 9.0* 8.5*  HCT 33.8* 25.6* 27.4* 27.1*  MCV 92.1 90.5 91.3 93.8  PLT 225 238 173 709   Basic Metabolic Panel: Recent Labs  Lab 09/24/19 2348 09/25/19 0306 09/27/19 0827  NA 138 140 140  K 4.9 4.7 4.4  CL 103 106 109  CO2 '26 26 26  '$ GLUCOSE 101* 95 92  BUN 30* 29* 20  CREATININE 1.82* 1.76* 1.31*  CALCIUM 10.1 9.9 10.2   GFR: Estimated Creatinine Clearance: 34 mL/min (A) (by C-G formula based on SCr of 1.31 mg/dL (H)). Liver Function Tests: Recent Labs  Lab 09/24/19 2348  AST 37  ALT 25  ALKPHOS 147*  BILITOT 0.8  PROT 7.3  ALBUMIN 2.5*   No results for input(s): LIPASE, AMYLASE in the last 168 hours. No results for input(s): AMMONIA in the last 168 hours. Coagulation Profile: Recent Labs  Lab 09/24/19 2348 09/25/19 0306  INR 1.1 1.1   Cardiac Enzymes: No results for input(s): CKTOTAL, CKMB, CKMBINDEX, TROPONINI in the last 168 hours. BNP (last 3 results) No results for input(s): PROBNP in the last 8760 hours. HbA1C: No results for input(s): HGBA1C in the last 72 hours. CBG: Recent Labs  Lab 09/27/19 0956 09/27/19 1328 09/27/19 2224 09/28/19 0821 09/28/19 1205  GLUCAP 91 95 78 84 83   Lipid Profile: No results for input(s): CHOL, HDL, LDLCALC, TRIG, CHOLHDL, LDLDIRECT in the last 72 hours. Thyroid Function Tests: No results for input(s): TSH, T4TOTAL, FREET4, T3FREE, THYROIDAB in the last 72 hours. Anemia Panel: No results for input(s): VITAMINB12, FOLATE, FERRITIN, TIBC, IRON, RETICCTPCT in the last 72 hours. Sepsis Labs: Recent Labs  Lab 09/24/19 2348  09/25/19 0306 09/25/19 0830 09/26/19 0613  PROCALCITON 0.19 0.19  --  0.20  LATICACIDVEN 1.4 2.4* 1.0  --     Recent Results (from the past 240 hour(s))  Resp Panel by RT PCR (RSV, Flu A&B, Covid) - Nasopharyngeal Swab     Status: None   Collection Time: 09/24/19 11:45 PM   Specimen: Nasopharyngeal Swab  Result Value Ref Range Status   SARS Coronavirus 2 by RT PCR NEGATIVE NEGATIVE Final    Comment: (NOTE) SARS-CoV-2 target nucleic acids are NOT DETECTED.  The SARS-CoV-2 RNA is generally detectable in upper respiratoy specimens during the acute phase of infection. The lowest concentration of SARS-CoV-2 viral copies this assay can detect is 131 copies/mL. A negative result does not preclude SARS-Cov-2 infection and should not be used as the sole basis for treatment or other patient management decisions. A negative result may occur with  improper specimen  collection/handling, submission of specimen other than nasopharyngeal swab, presence of viral mutation(s) within the areas targeted by this assay, and inadequate number of viral copies (<131 copies/mL). A negative result must be combined with clinical observations, patient history, and epidemiological information. The expected result is Negative.  Fact Sheet for Patients:  https://www.moore.com/  Fact Sheet for Healthcare Providers:  https://www.young.biz/  This test is no t yet approved or cleared by the [place] FDA and  has been authorized for detection and/or diagnosis of SARS-CoV-2 by FDA under an Emergency Use Authorization (EUA). This EUA will remain  in effect (meaning this test can be used) for the duration of the COVID-19 declaration under Section 564(b)(1) of the Act, 21 U.S.C. section 360bbb-3(b)(1), unless the authorization is terminated or revoked sooner.     Influenza A by PCR NEGATIVE NEGATIVE Final   Influenza B by PCR NEGATIVE NEGATIVE Final    Comment:  (NOTE) The Xpert Xpress SARS-CoV-2/FLU/RSV assay is intended as an aid in  the diagnosis of influenza from Nasopharyngeal swab specimens and  should not be used as a sole basis for treatment. Nasal washings and  aspirates are unacceptable for Xpert Xpress SARS-CoV-2/FLU/RSV  testing.  Fact Sheet for Patients: https://www.moore.com/  Fact Sheet for Healthcare Providers: https://www.young.biz/  This test is not yet approved or cleared by the [place] FDA and  has been authorized for detection and/or diagnosis of SARS-CoV-2 by  FDA under an Emergency Use Authorization (EUA). This EUA will remain  in effect (meaning this test can be used) for the duration of the  Covid-19 declaration under Section 564(b)(1) of the Act, 21  U.S.C. section 360bbb-3(b)(1), unless the authorization is  terminated or revoked.    Respiratory Syncytial Virus by PCR NEGATIVE NEGATIVE Final    Comment: (NOTE) Fact Sheet for Patients: https://www.moore.com/  Fact Sheet for Healthcare Providers: https://www.young.biz/  This test is not yet approved or cleared by the [place] FDA and  has been authorized for detection and/or diagnosis of SARS-CoV-2 by  FDA under an Emergency Use Authorization (EUA). This EUA will remain  in effect (meaning this test can be used) for the duration of the  COVID-19 declaration under Section 564(b)(1) of the Act, 21 U.S.C.  section 360bbb-3(b)(1), unless the authorization is terminated or  revoked. Performed at [facility], [address] [place], [place] [address]   Blood culture (routine x 2)     Status: None (Preliminary result)   Collection Time: 09/24/19 11:48 PM   Specimen: BLOOD  Result Value Ref Range Status   Specimen Description BLOOD BLOOD LEFT FOREARM  Final   Special Requests   Final    BOTTLES DRAWN AEROBIC AND ANAEROBIC Blood Culture results may not be optimal due  to an inadequate volume of blood received in culture bottles   Culture   Final    NO GROWTH 3 DAYS Performed at [facility], [address], [place], [place] [address]    Report Status PENDING  Incomplete  Urine culture     Status: Abnormal   Collection Time: 09/24/19 11:49 PM   Specimen: Urine, Random  Result Value Ref Range Status   Specimen Description   Final    URINE, RANDOM Performed at [facility], [address], [place], [place] [address]    Special Requests   Final    NONE Performed at [facility], [address] [place], [place] [address]    Culture (A)  Final    >=100,000  COLONIES/mL ESCHERICHIA COLI Confirmed Extended Spectrum Beta-Lactamase Producer (ESBL).  In bloodstream infections from ESBL organisms, carbapenems are preferred over piperacillin/tazobactam. They are shown to have a lower risk of mortality.    Report Status 09/27/2019 FINAL  Final   Organism ID, Bacteria ESCHERICHIA COLI (A)  Final      Susceptibility   Escherichia coli - MIC*    AMPICILLIN >=32 RESISTANT Resistant     CEFAZOLIN >=64 RESISTANT Resistant     CEFTRIAXONE >=64 RESISTANT Resistant     CIPROFLOXACIN >=4 RESISTANT Resistant     GENTAMICIN >=16 RESISTANT Resistant     IMIPENEM <=0.25 SENSITIVE Sensitive     NITROFURANTOIN <=16 SENSITIVE Sensitive     TRIMETH/SULFA >=320 RESISTANT Resistant     AMPICILLIN/SULBACTAM 16 INTERMEDIATE Intermediate     PIP/TAZO <=4 SENSITIVE Sensitive     * >=100,000 COLONIES/mL ESCHERICHIA COLI  Blood culture (routine x 2)     Status: None (Preliminary result)   Collection Time: 09/24/19 11:53 PM   Specimen: BLOOD  Result Value Ref Range Status   Specimen Description BLOOD BLOOD LEFT HAND  Final   Special Requests   Final    BOTTLES DRAWN AEROBIC AND ANAEROBIC Blood Culture adequate volume   Culture   Final    NO GROWTH 3 DAYS Performed at [facility], [address], [place], [place] [address]    Report  Status PENDING  Incomplete  MRSA PCR Screening     Status: Abnormal   Collection Time: 09/25/19  5:37 PM   Specimen: Nasal Mucosa; Nasopharyngeal  Result Value Ref Range Status   MRSA by PCR POSITIVE (A) NEGATIVE Final    Comment:        The GeneXpert MRSA Assay (FDA approved for NASAL specimens only), is one component of a comprehensive MRSA colonization surveillance program. It is not intended to diagnose MRSA infection nor to guide or monitor treatment for MRSA infections. RESULT CALLED TO, READ BACK BY AND VERIFIED WITH: CHRIS BELL 09/25/19 AT 1928 BY ACR Performed at [facility], [address], [place],  [address]      Radiology Studies: No results found.  Scheduled Meds: . acetaminophen  325 mg Rectal Once  . acidophilus  1 capsule Oral BID  . ascorbic acid  500 mg Oral Daily  . atorvastatin  80 mg Oral QPM  . Chlorhexidine Gluconate Cloth  6 each Topical Q0600  . cholecalciferol  1,000 Units Oral Daily  . docusate sodium  100 mg Oral BID  . enoxaparin (LOVENOX) injection  40 mg Subcutaneous Q24H  . ferrous sulfate  325 mg Oral BID  . fosfomycin  3 g Oral Once  . gabapentin  300 mg Oral QHS  . insulin aspart  0-15 Units Subcutaneous TID WC  . insulin aspart  0-5 Units Subcutaneous QHS  . lacosamide  100 mg Oral BID  . levETIRAcetam  750 mg Oral Daily  . linagliptin  5 mg Oral Daily  . mupirocin ointment  1 application Nasal BID  . pantoprazole  40 mg Oral Daily  . zinc sulfate  220 mg Oral Daily   Continuous Infusions: . sodium chloride 100 mL/hr at 09/27/19 0738     LOS: 3 days   Time spent: 30 minutes.  Lorella Nimrod, MD [facility]  If 7PM-7AM, please contact night-coverage [email or web address]  09/28/2019, 1:34 PM   This record has been created using Systems analyst. Errors have been sought and corrected,but may not always be located.  Such creation errors do not reflect on the standard of care.

## 2019-09-28 NOTE — Progress Notes (Addendum)
Went over discharge paperwork with the pt. Pt. Verbalized understanding. IV removed. Pt. Left the unit via EMS around 1800 to [facility].

## 2019-09-29 DIAGNOSIS — N390 Urinary tract infection, site not specified: Secondary | ICD-10-CM | POA: Diagnosis not present

## 2019-09-30 LAB — CULTURE, BLOOD (ROUTINE X 2)
Culture: NO GROWTH
Culture: NO GROWTH
Special Requests: ADEQUATE

## 2019-10-01 ENCOUNTER — Encounter: Payer: Self-pay | Admitting: Nurse Practitioner

## 2019-10-01 ENCOUNTER — Non-Acute Institutional Stay: Payer: Medicare HMO | Admitting: Nurse Practitioner

## 2019-10-01 VITALS — BP 135/80 | Temp 98.0°F | Wt 176.5 lb

## 2019-10-01 DIAGNOSIS — Z515 Encounter for palliative care: Secondary | ICD-10-CM | POA: Diagnosis not present

## 2019-10-01 DIAGNOSIS — I509 Heart failure, unspecified: Secondary | ICD-10-CM

## 2019-10-01 NOTE — Progress Notes (Signed)
[facility] Pall[facility] Consult Note Telephone: [phone]  Fax: [phone]  PATIENT NAME: Katherine [name] DOB: Jul 12, 1940 MRN: [id number] PRIMARY CARE PROVIDER:   Dr Hodges/[facility] RESPONSIBLE PARTY:Self/Katherine [name] spouse [phone]  RECOMMENDATIONS and PLAN: 1.ACP: remains a full code; most form in ACP  2.Edema secondary toCHF, monitor weights, elevate  3.Shortness of breathsecondary toCHF, continue diuresing, inhalation therapy; Continuous O2,Continue daily weights  4.Palliative care encounter; Palliative medicine team will continue to support patient, patient's family, and medical team. Visit consisted of counseling and education dealing with the complex and emotionally intense issues of symptom management and palliative care in the setting of serious and potentially life-threatening illness  5. F/u 4 weeks or sooner if declines; high risk for re-hospitalization   I spent 60 minutes providing this consultation, starting at 2:00pm. More than 50% of the time in this consultation was spent coordinating communication.   HISTORY OF PRESENT ILLNESS:  Katherine [name] is a 79 y.o. year old female with multiple medical problems including .Diastolic congestive heart failure, left ventricular hypertrophy, aneurysm of anterior com artery, pallapa vocal cord, obstructive sleep apnea, hypoventilation syndrome secondary to obesity, late onset CVA, seizure disorder, COPD, pulmonary scarring,lymphedema, diabetes, chronic kidney disease, hypertension, osteoarthritis, gerd, gallstones, rotator cuff syndrome, lymphedema, lichens simplex chronicus , history of angioedema, anxiety, depression, vitamin D deficiency, right total knee arthroplasty, polypectomy, cholecystectomy, tobacco.   Hospitalize 10 / 8 / 2020 to 10/13 / 2020 for shortness of breath on chronic oxygen with acute pulmonary embolism on Heparin transition to  Eliquis.  Hospitalized 11 / 08/2018 to 11 / 17 / 2020 for nosebleed, covid viral pneumonia, recent PE, history of UTI/ESBL, leukocytosis.  Hospitalized 11 / 20 / 2020 to 45 / 28 / 2020 for history of covid virus pneumonia recent infection, anemia, seizure disorder with suspected elevated CRP from sepsis UTI  Hospitalize 12 / 29 / 2020 to 1/2 / 2021 for acute encephalopathy unknown etiology, CT brain revealed small vessel ischemic disease however overall and revealing. Neurology saw continued Keppra, iron supplements, right hand pain.  Hospitalized 5/6 / 2021 to 5 / 9 / 2021 hospitalized for sepsis secondary to UTI with improve mental status, acute kidney injury on chronic kidney disease, worsening iron deficiency anemia discontinued Eliquis  Hospitalized 8 / 12/2019 to 8 / 15 / 2021 altered mental status generalized weakness and chills with workup significant for esbl, e-coli with prior history of esbl, E coli.  Katherine [name] was discharged back to [facility] where she is a long-term resident in [place]. Katherine [name] is total ADL dependent, bed bound and require staff to assist her for turning in positioning secondary to obesity. Katherine [name] says require assistance for bathing, dressing, toileting. Katherine. [name] does feed herself and appetite remains declined. Staff endorses since Katherine [name] has returned from last hospitalization she continues to have some intermittent mild confusion with event, dates time. At present Katherine [name] is lying in bed, appears chronically ill, oxygen-dependent though no distress. No visitors present. I visited and observed Katherine [name]. Katherine. [name] makes eye contact with verbal cues that she is her own responsible party. We did talk about purpose of palliative care visit and Katherine [name] in agreement. We talked about how she was feeling today. Katherine [name] endorses that she is doing okay, feeling better since she was in the hospital. We talked about what her daily routine is  like at the facility. Encouraged her to get  out of bed increase mobility but she declined. We talked about symptoms of pain and shortness of breath what she currently is comfortable. We talked about chronic disease progression an oxygen dependency. We talked about medical goals of care is her wishes are to continue to be a full code and aggressive interventions. We talked about role of palliative care in plan of care. We talked about follow up palliative care visit in 4 weeks if needed or sooner should she declined as she is high risk for free hospitalization. Therapeutic listening, emotional support provided. Questions answer. I updated nursing staff no new changes to current goals or plan of care.  Palliative Care was asked to help to continue to address goals of care.   CODE STATUS: Full code  PPS: 30% HOSPICE ELIGIBILITY/DIAGNOSIS: TBD  PAST MEDICAL HISTORY:  Past Medical History:  Diagnosis Date  . Angioedema   . Anxiety and depression   . CHF (congestive heart failure) ([place])   . Chronic constipation   . Chronic kidney disease    stage 3  . COPD (chronic obstructive pulmonary disease) ([place])   . Diabetes mellitus without complication ([place])   . Gallstones   . GERD (gastroesophageal reflux disease)   . Hypertension   . Left ventricular hypertrophy   . Osteoarthritis   . Prurigo nodularis   . Seizures ([place])   . Stroke ([place])   . Tobacco abuse   . Vitamin D deficiency disease     SOCIAL HX:  Social History   Tobacco Use  . Smoking status: Former Smoker    Packs/day: 0.50    Years: 60.00    Pack years: 30.00    Types: Cigarettes  . Smokeless tobacco: Never Used  . Tobacco comment: quite 84mo ago-03/26/18  Substance Use Topics  . Alcohol use: No    Alcohol/week: 0.0 standard drinks    Comment: rare    ALLERGIES:  Allergies  Allergen Reactions  . Bee Venom Swelling  . Enalapril Maleate Swelling     PERTINENT MEDICATIONS:  Outpatient Encounter Medications as of  10/01/2019  Medication Sig  . acetaminophen (TYLENOL) 650 MG CR tablet Take 650 mg by mouth 3 (three) times daily.   [name] albuterol (ACCUNEB) 0.63 MG/3ML nebulizer solution Take 3 mLs by nebulization every 8 (eight) hours as needed for wheezing.   [name] atorvastatin (LIPITOR) 80 MG tablet Take 80 mg by mouth every evening.   . cetirizine (ZYRTEC) 10 MG tablet Take 10 mg by mouth daily.   . cholecalciferol (VITAMIN D) 1000 units tablet Take 1,000 Units by mouth daily.  [name] docusate sodium (COLACE) 100 MG capsule Take 100 mg by mouth 2 (two) times daily.  . ferrous sulfate 325 (65 FE) MG tablet Take 325 mg by mouth 2 (two) times daily.  . Fluticasone-Umeclidin-Vilant 100-62.5-25 MCG/INH AEPB Inhale 1 puff into the lungs daily.  [name] gabapentin (NEURONTIN) 300 MG capsule Take 300 mg by mouth at bedtime.  [name] ipratropium-albuterol (DUONEB) 0.5-2.5 (3) MG/3ML SOLN Take 3 mLs by nebulization 3 (three) times daily.  [name] lacosamide 100 MG TABS Take 1 tablet (100 mg total) by mouth 2 (two) times daily.  [name] levETIRAcetam (KEPPRA) 750 MG tablet Take 750 mg by mouth daily.   [name] lidocaine (LIDODERM) 5 % Place 1 patch onto the skin every 12 (twelve) hours as needed. Remove & Discard patch within 12 hours or as directed by MD  . linagliptin (TRADJENTA) 5 MG TABS tablet Take 5 mg by mouth daily.  . meclizine (  ANTIVERT) 25 MG tablet Take 25 mg by mouth every 12 (twelve) hours as needed for dizziness.   . Menthol, Topical Analgesic, (BIOFREEZE) 4 % GEL Apply 1 application topically in the morning and at bedtime. To both knees   . mupirocin ointment (BACTROBAN) 2 % Place 1 application into the nose 2 (two) times daily.  . nitroGLYCERIN (NITROSTAT) 0.4 MG SL tablet Place 0.4 mg under the tongue every 5 (five) minutes as needed for chest pain.   . pantoprazole (PROTONIX) 40 MG tablet Take 1 tablet (40 mg total) by mouth daily.  . Saccharomyces boulardii (PROBIOTIC) 250 MG CAPS Take 1 capsule by mouth 2 times daily at 12 noon and 4 pm.  (Patient taking differently: Take 1 capsule by mouth 2 (two) times daily. )  . Saline GEL Place 3 sprays into the nose 3 (three) times daily.   . vitamin C (VITAMIN C) 500 MG tablet Take 1 tablet (500 mg total) by mouth daily.  [name] zinc sulfate 220 (50 Zn) MG capsule Take 1 capsule (220 mg total) by mouth daily.   No facility-administered encounter medications on file as of 10/01/2019.    PHYSICAL EXAM:   General: chronically ill, debilitated, pleasant female Cardiovascular: regular rate and rhythm Pulmonary: clear ant fields; decrease bases Neurological: functionally quadriplegic  [place], NP

## 2019-10-02 ENCOUNTER — Other Ambulatory Visit: Payer: Self-pay

## 2019-10-07 DIAGNOSIS — I739 Peripheral vascular disease, unspecified: Secondary | ICD-10-CM | POA: Diagnosis not present

## 2019-10-07 DIAGNOSIS — B351 Tinea unguium: Secondary | ICD-10-CM | POA: Diagnosis not present

## 2019-10-07 DIAGNOSIS — L601 Onycholysis: Secondary | ICD-10-CM | POA: Diagnosis not present

## 2019-10-16 IMAGING — CR DG CHEST 2V
1 series · 2 of 2 positions shown · non-contrast
Comparison: 11/28/2015

CLINICAL DATA: Shortness of breath.  COPD.

EXAM:
CHEST  2 VIEW

[Series 1: dg chest 2 view · 0.14mm/px · 2 of 2 slices shown]
[im 1/2]
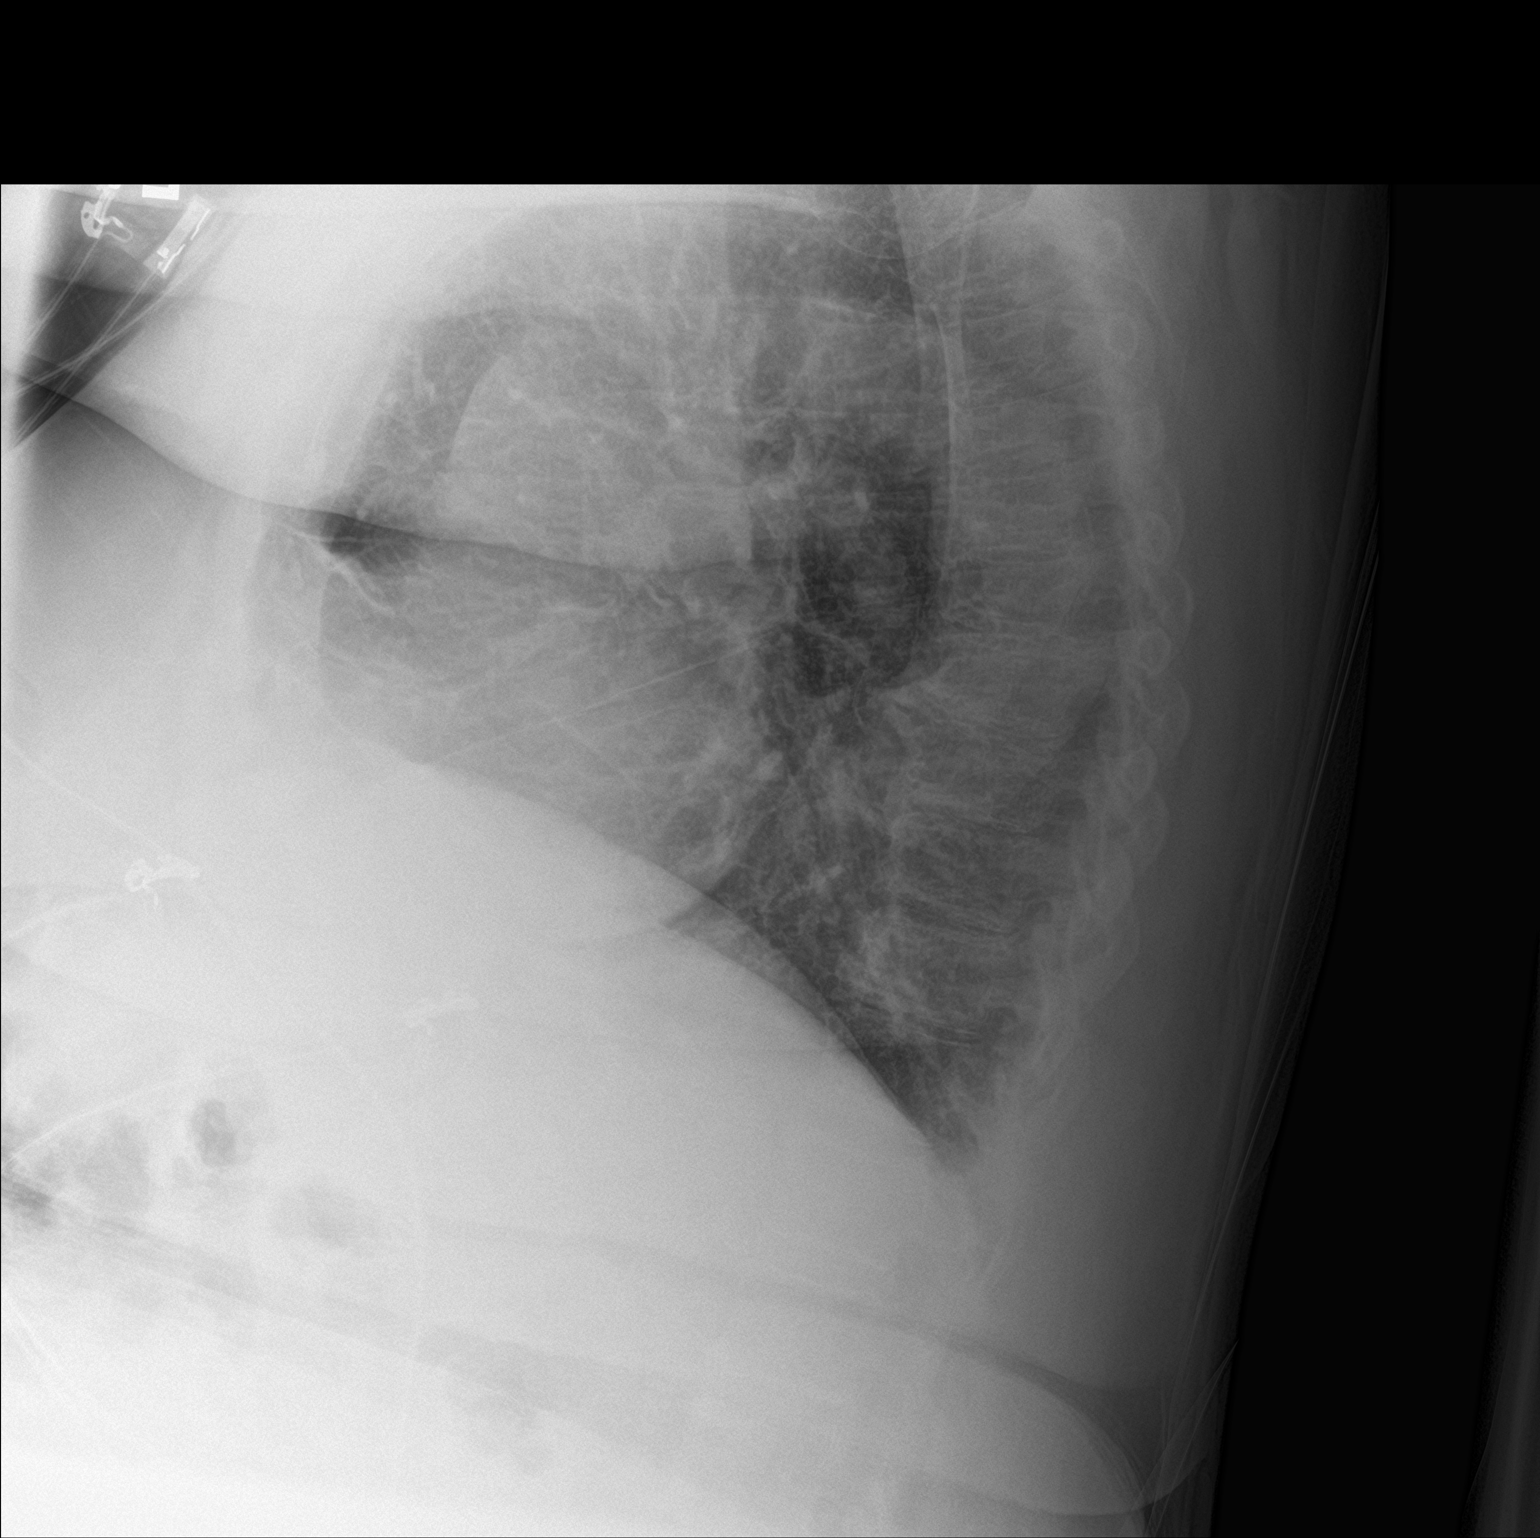
[im 2/2]
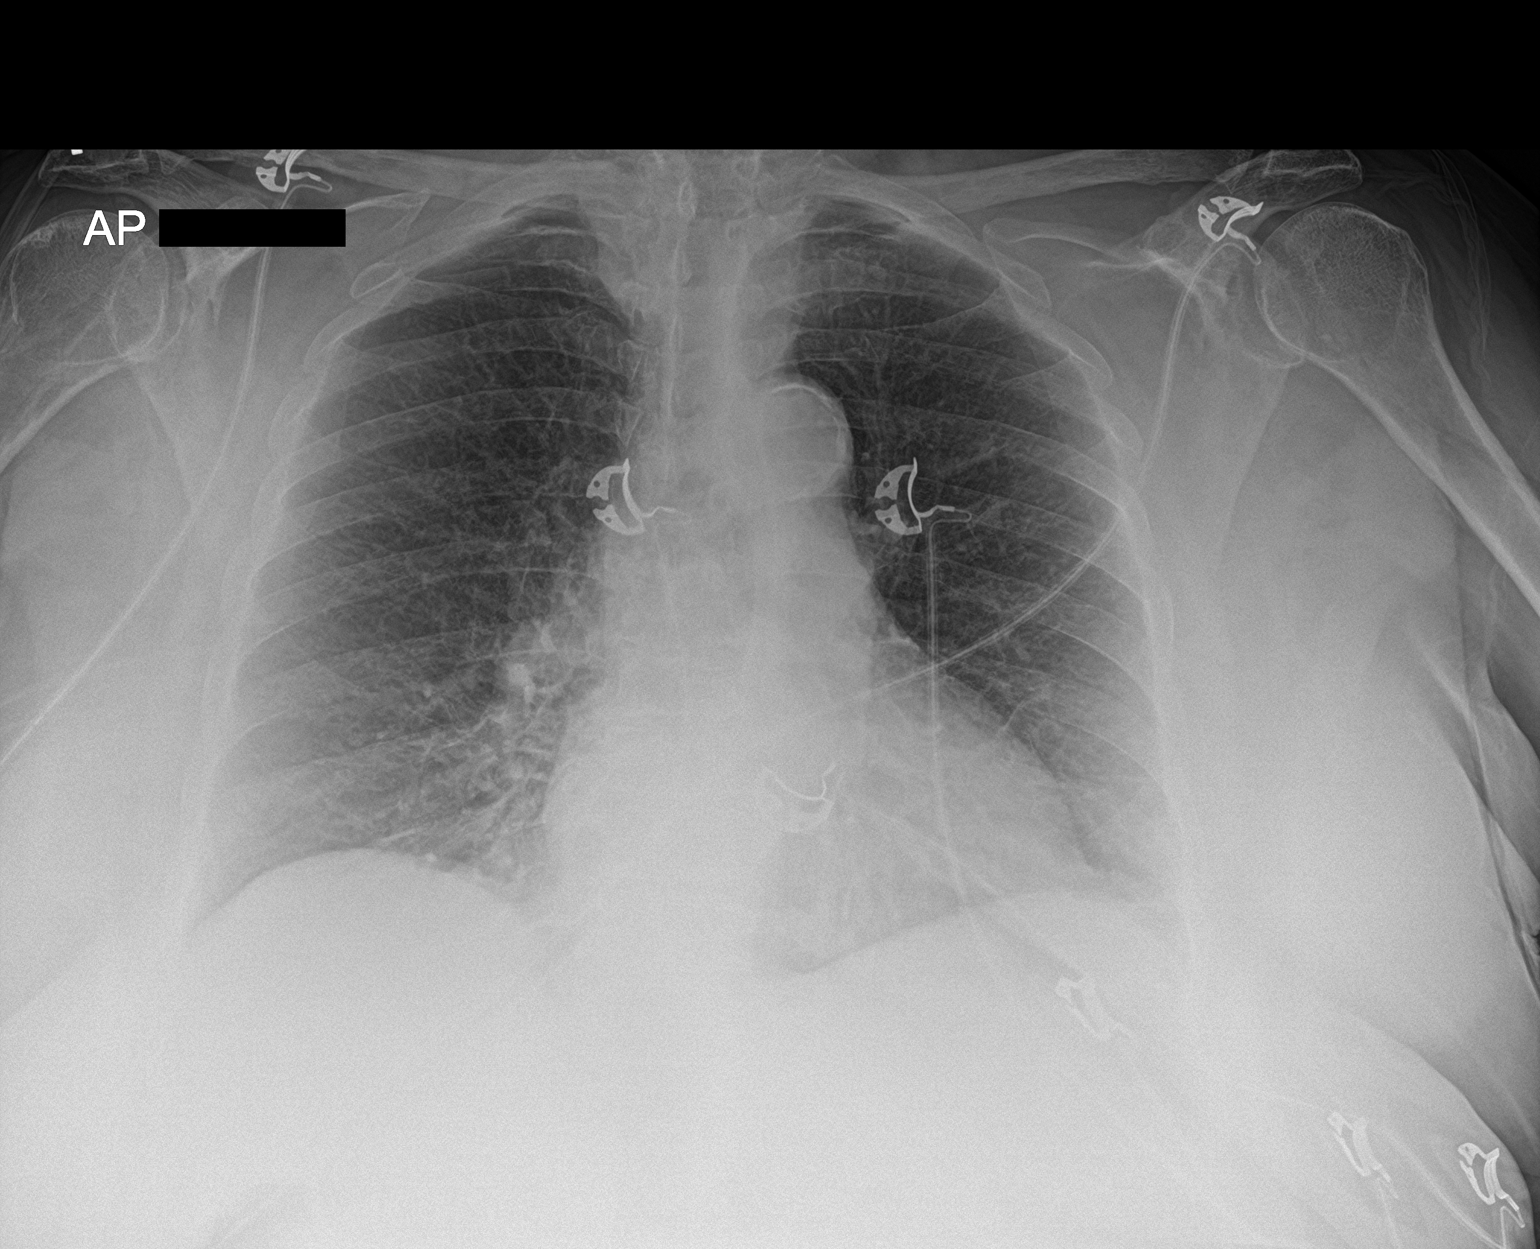

[2 of 2 positions shown; findings below may reference images not displayed]

FINDINGS: The cardiac silhouette remains borderline enlarged. Aortic
atherosclerosis is noted. Chronic interstitial coarsening is
unchanged. No confluent airspace opacity, overt pulmonary edema,
pleural effusion, pneumothorax is identified. No acute osseous
abnormality is seen.
IMPRESSION: COPD without evidence of active cardiopulmonary disease.

## 2019-10-23 IMAGING — CT CT CHEST LUNG CANCER SCREENING LOW DOSE W/O CM
2 of 5 series · 15 of 40 positions shown, 18 images · non-contrast
Comparison: 04/05/2017 chest radiograph. 02/12/2015 chest CT
angiogram.

CLINICAL DATA: 76-year-old asymptomatic female current smoker with
30 pack-year smoking history.

EXAM:
CT CHEST WITHOUT CONTRAST LOW-DOSE FOR LUNG CANCER SCREENING
TECHNIQUE: Multidetector CT imaging of the chest was performed following the
standard protocol without IV contrast.

[Series 3: lung · axial · 0.59mm/px · z∈[-1329,-1035]mm · 12 of 324 slices shown, 15 images (1 of 2)]
[im 15/324  mediastinal]
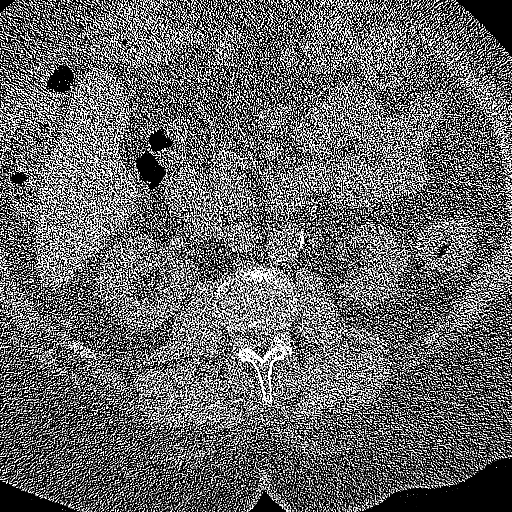
[im 15/324  lung]
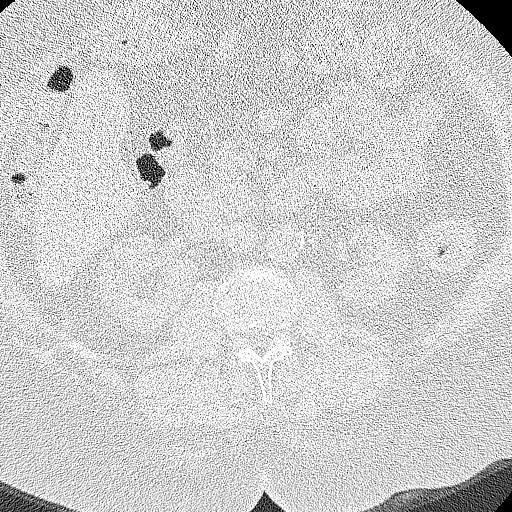
[im 45/324  lung]
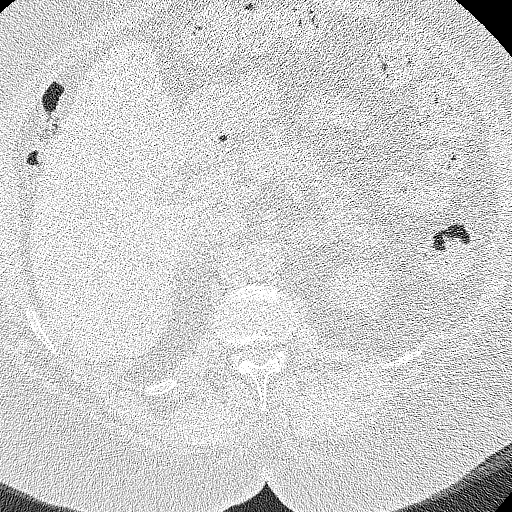
[im 74/324  lung]
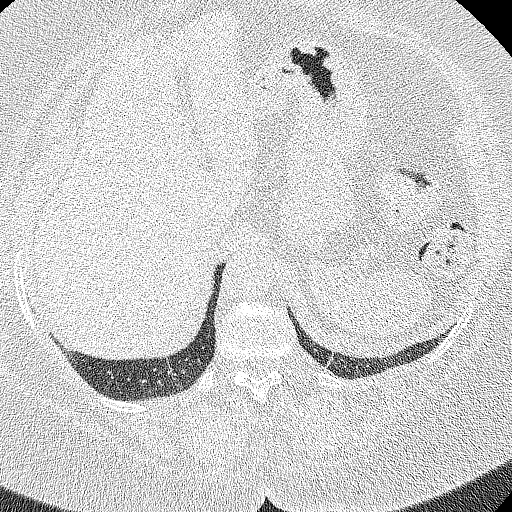
[im 103/324  lung]
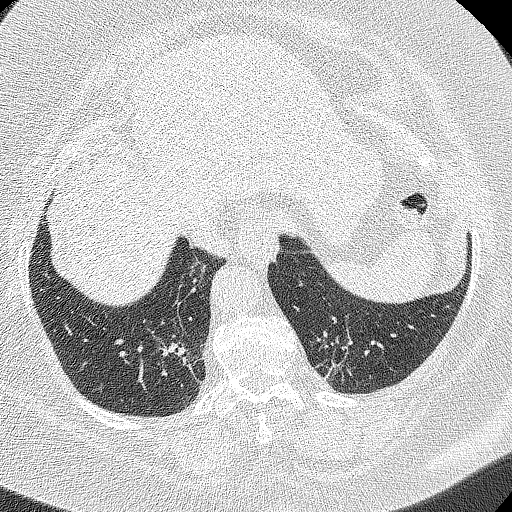
[im 118/324  mediastinal]
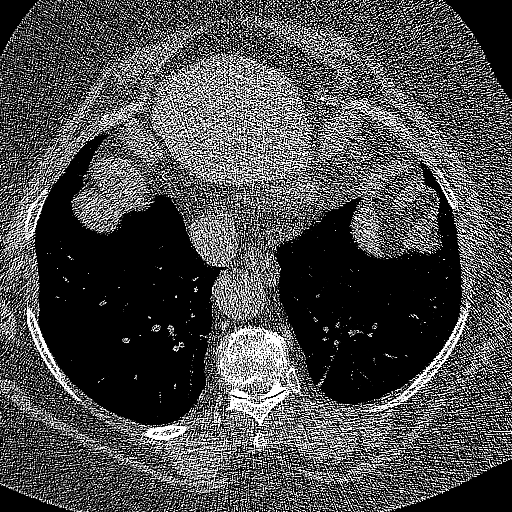
[im 118/324  lung]
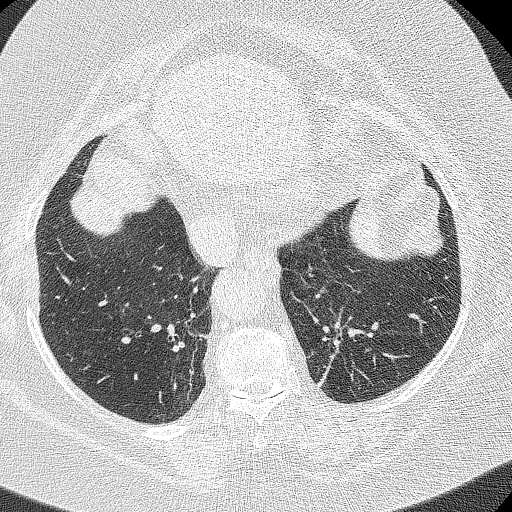
[im 147/324  lung]
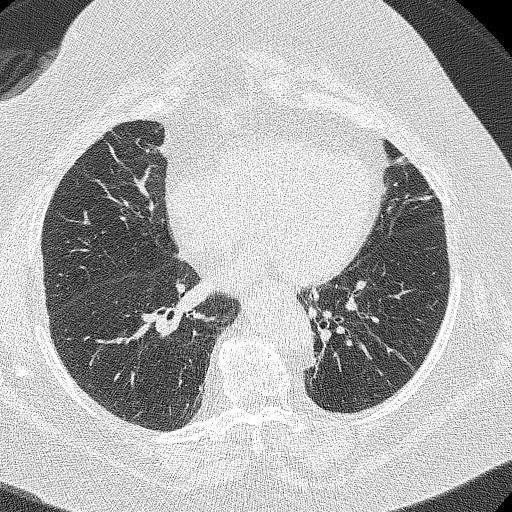
[im 177/324  lung]
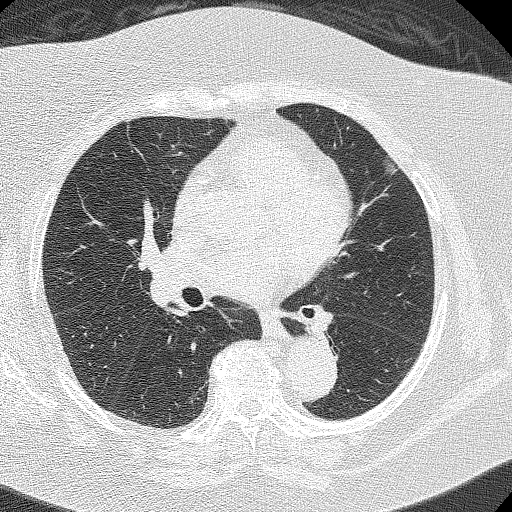
[im 206/324  lung]
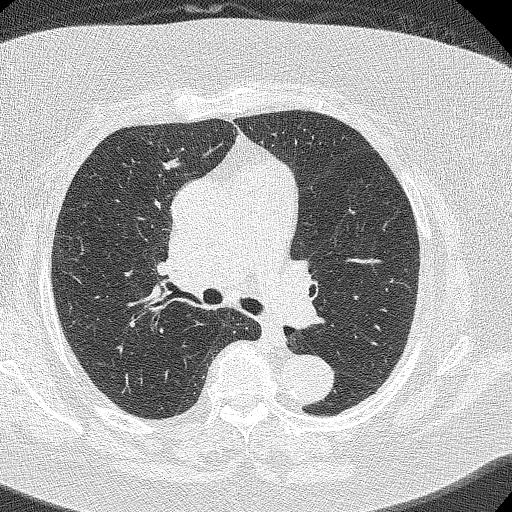
[im 221/324  mediastinal]
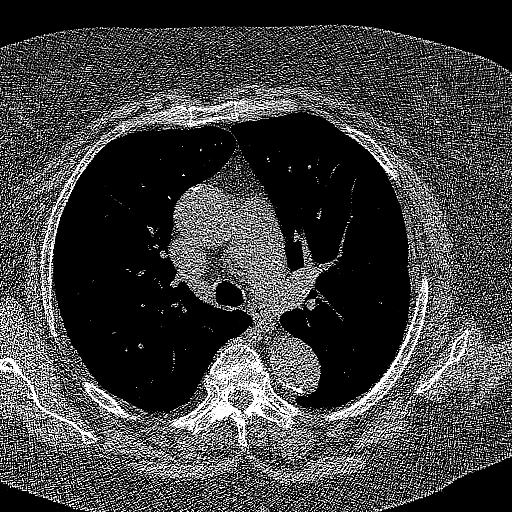
[im 221/324  lung]
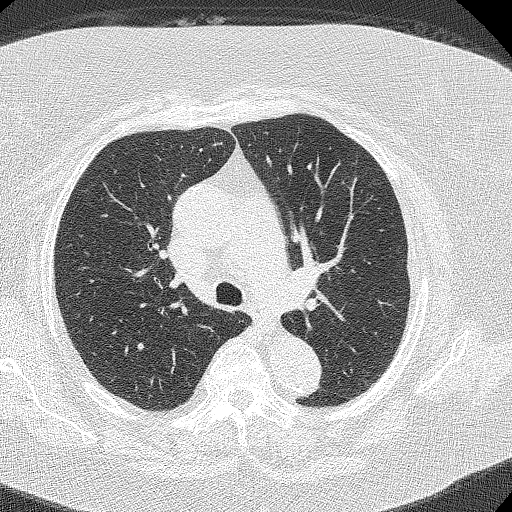
[im 250/324  lung]
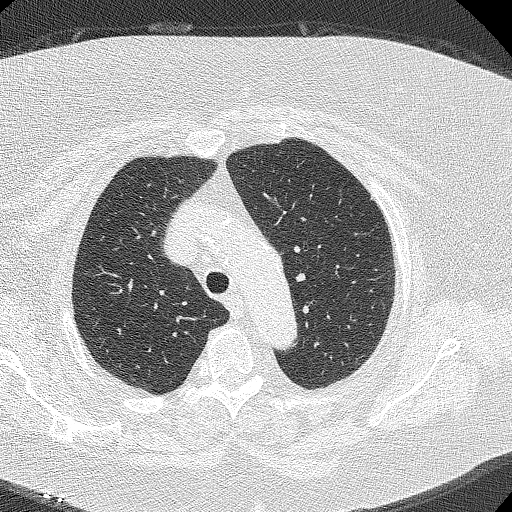
[im 279/324  lung]
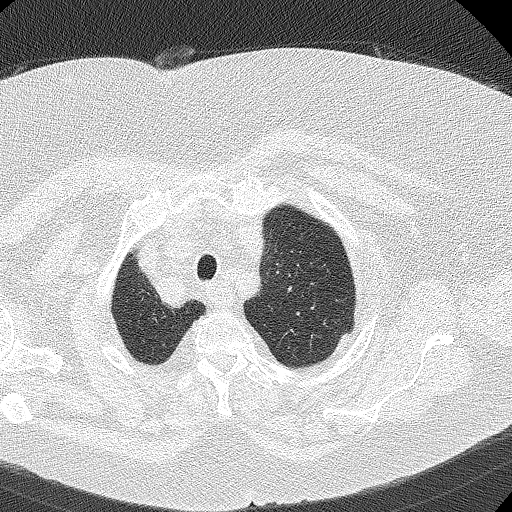
[im 309/324  lung]
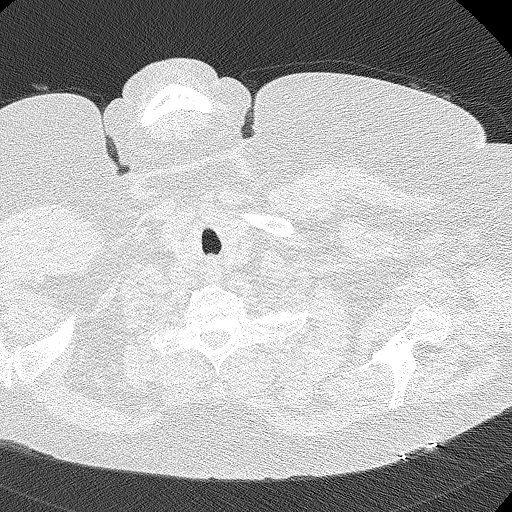

[Series 4: lung · coronal · 0.59mm/px · 3 of 300 slices shown (2 of 2)]
[im 60/300  lung]
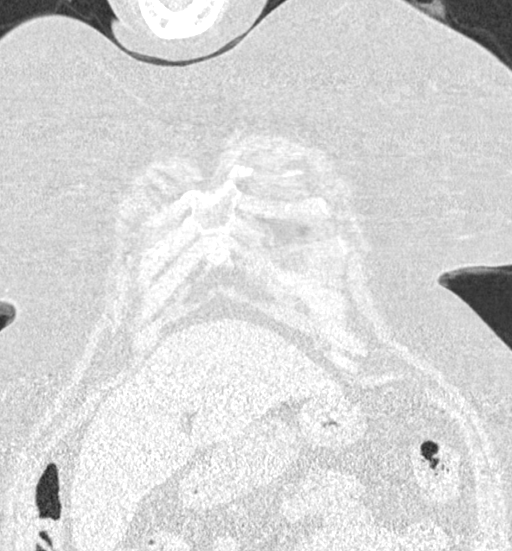
[im 120/300  lung]
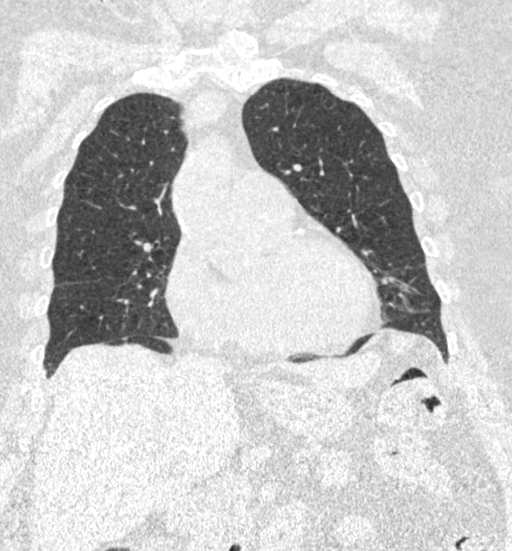
[im 180/300  lung]
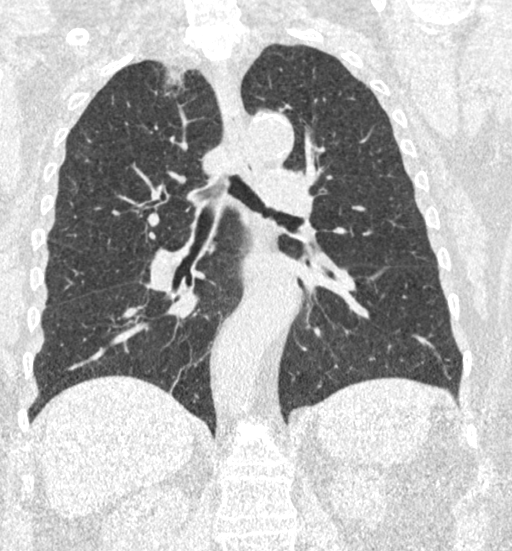

[15 of 40 positions shown; findings below may reference images not displayed]

FINDINGS: Cardiovascular: Normal heart size. No significant pericardial
fluid/thickening. Left main and 3 vessel coronary atherosclerosis.
Atherosclerotic nonaneurysmal thoracic aorta. Dilated main pulmonary
artery (3.5 cm diameter), stable.

Mediastinum/Nodes: No discrete thyroid nodules. Unremarkable
esophagus. No pathologically enlarged axillary, mediastinal or gross
hilar lymph nodes, noting limited sensitivity for the detection of
hilar adenopathy on this noncontrast study.

Lungs/Pleura: No pneumothorax. No pleural effusion. Mild
centrilobular emphysema with mild diffuse bronchial wall thickening.
Scattered parenchymal bands in the mid to lower lungs, compatible
with mild postinfectious/postinflammatory scarring. No acute
consolidative airspace disease or lung masses. Tiny anterior right
lower lobe pulmonary nodule measuring 2.4 mm in volume derived mean
diameter (series 3/image 101). No additional significant pulmonary
nodules.

Upper abdomen: Cholecystectomy. Simple 1.0 cm posterior upper left
renal cyst.

Musculoskeletal: No aggressive appearing focal osseous lesions.
Moderate thoracic spondylosis.
IMPRESSION: 1. Lung-RADS 2, benign appearance or behavior. Continue annual
screening with low-dose chest CT without contrast in 12 months.
2. Left main and 3 vessel coronary atherosclerosis.
3. Dilated main pulmonary artery, stable, suggesting chronic
pulmonary arterial hypertension.

Aortic Atherosclerosis (QJS43-7IF.F) and Emphysema (QJS43-SMS.H).

## 2019-10-30 DIAGNOSIS — I509 Heart failure, unspecified: Secondary | ICD-10-CM | POA: Diagnosis not present

## 2019-10-30 DIAGNOSIS — J9611 Chronic respiratory failure with hypoxia: Secondary | ICD-10-CM | POA: Diagnosis not present

## 2019-10-31 DIAGNOSIS — I5032 Chronic diastolic (congestive) heart failure: Secondary | ICD-10-CM | POA: Diagnosis not present

## 2019-10-31 IMAGING — DX DG CHEST 1V PORT
1 series · 1 of 1 positions shown · non-contrast
Comparison: 04/05/2017

CLINICAL DATA: Shortness of breath.  Chest pain.

EXAM:
PORTABLE CHEST 1 VIEW

[chest ap]
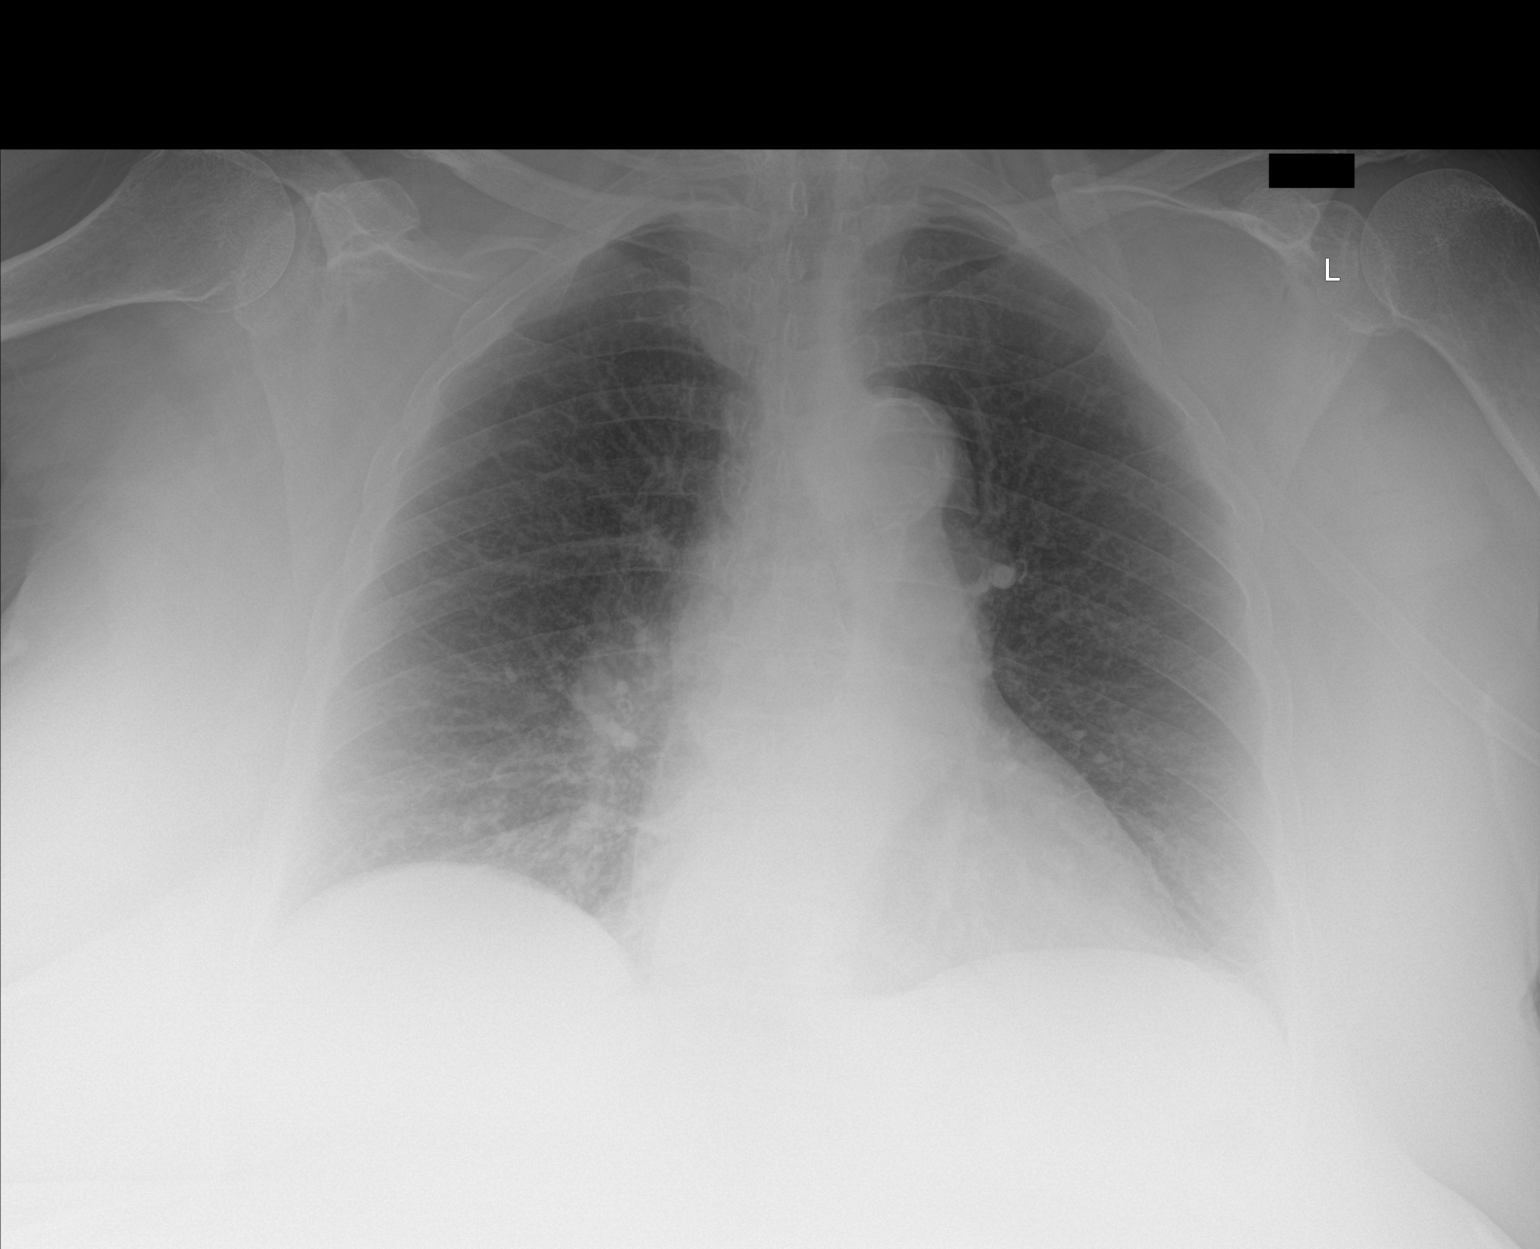

[1 of 1 positions shown; findings below may reference images not displayed]

FINDINGS: Cardiac silhouette is normal in size. No mediastinal or hilar
masses. No convincing adenopathy.

There are prominent bronchovascular markings without change from the
prior chest radiograph. No evidence of pneumonia or pulmonary edema.

No pleural effusion or pneumothorax.

Skeletal structures are grossly intact.
IMPRESSION: No acute cardiopulmonary disease.

## 2019-11-09 DIAGNOSIS — R0989 Other specified symptoms and signs involving the circulatory and respiratory systems: Secondary | ICD-10-CM | POA: Diagnosis not present

## 2019-11-09 DIAGNOSIS — J9811 Atelectasis: Secondary | ICD-10-CM | POA: Diagnosis not present

## 2019-11-09 DIAGNOSIS — R1084 Generalized abdominal pain: Secondary | ICD-10-CM | POA: Diagnosis not present

## 2019-11-09 DIAGNOSIS — K219 Gastro-esophageal reflux disease without esophagitis: Secondary | ICD-10-CM | POA: Diagnosis not present

## 2019-11-09 DIAGNOSIS — K5900 Constipation, unspecified: Secondary | ICD-10-CM | POA: Diagnosis not present

## 2019-11-10 DIAGNOSIS — S01301A Unspecified open wound of right ear, initial encounter: Secondary | ICD-10-CM | POA: Diagnosis not present

## 2019-11-14 ENCOUNTER — Non-Acute Institutional Stay: Payer: Medicare HMO | Admitting: Nurse Practitioner

## 2019-11-14 ENCOUNTER — Other Ambulatory Visit: Payer: Self-pay

## 2019-11-14 ENCOUNTER — Encounter: Payer: Self-pay | Admitting: Nurse Practitioner

## 2019-11-14 DIAGNOSIS — Z515 Encounter for palliative care: Secondary | ICD-10-CM

## 2019-11-14 DIAGNOSIS — I509 Heart failure, unspecified: Secondary | ICD-10-CM | POA: Diagnosis not present

## 2019-11-14 IMAGING — CT CT ABD-PELV W/O CM
2 of 4 series · 15 of 46 positions shown, 17 images · non-contrast
Comparison: 04/29/2004 and chest CT 04/12/2016

CLINICAL DATA: Right upper quadrant pain and bloody stools.
Diarrhea.

EXAM:
CT ABDOMEN AND PELVIS WITHOUT CONTRAST
TECHNIQUE: Multidetector CT imaging of the abdomen and pelvis was performed
following the standard protocol without IV contrast.

[Series 5: axial st · axial · 0.74mm/px · z∈[-628,-258]mm · 12 of 85 slices shown, 14 images]
[im 7/85  soft-tissue]
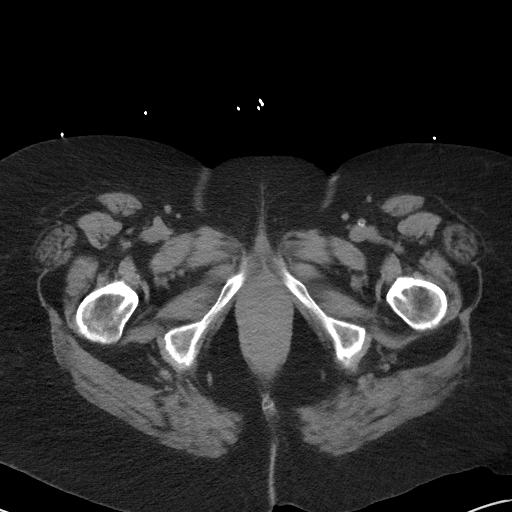
[im 7/85  bone]
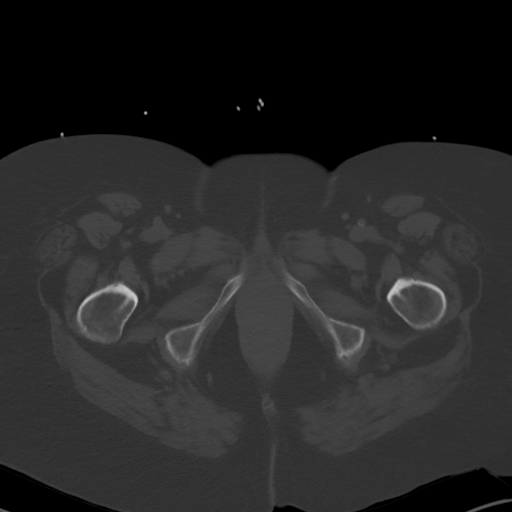
[im 14/85  soft-tissue]
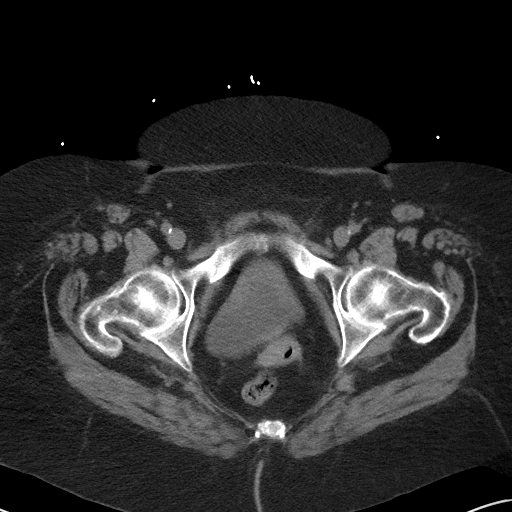
[im 21/85  soft-tissue]
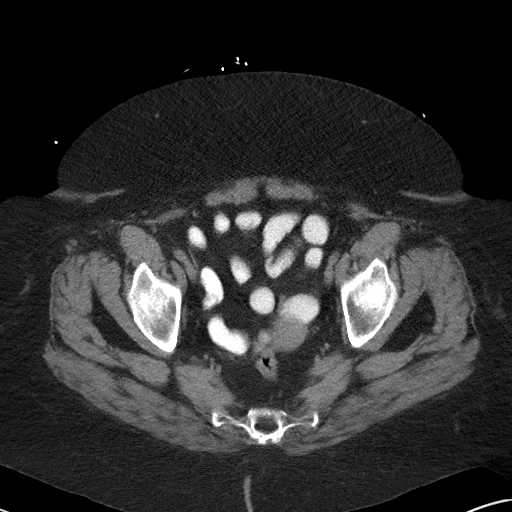
[im 27/85  soft-tissue]
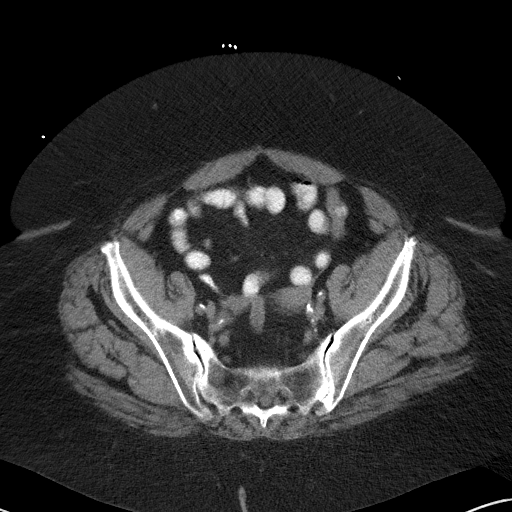
[im 34/85  soft-tissue]
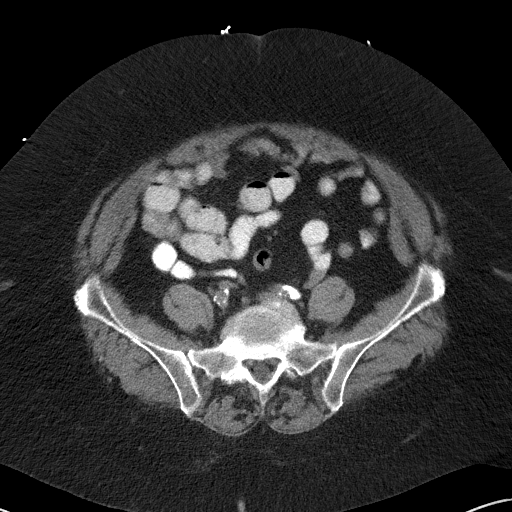
[im 41/85  soft-tissue]
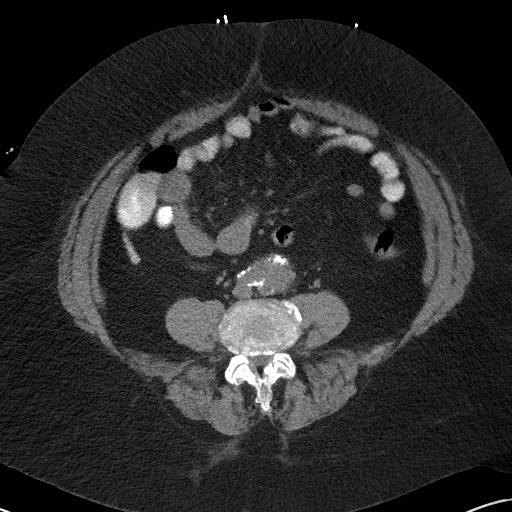
[im 48/85  soft-tissue]
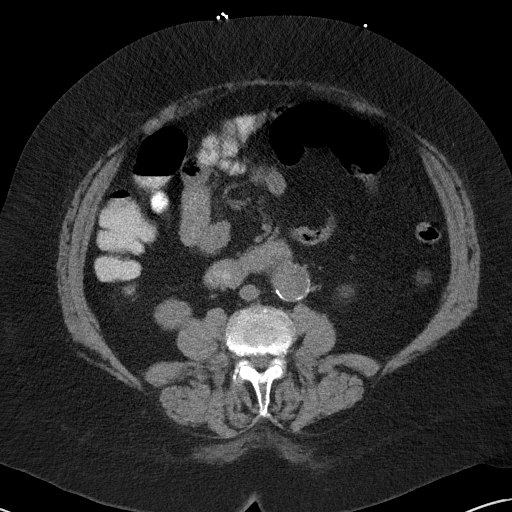
[im 54/85  soft-tissue]
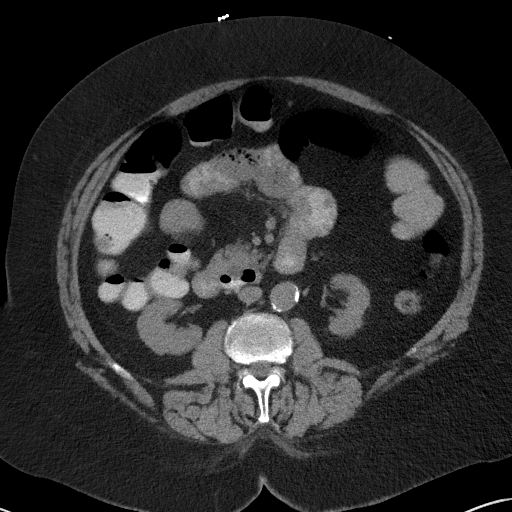
[im 61/85  soft-tissue]
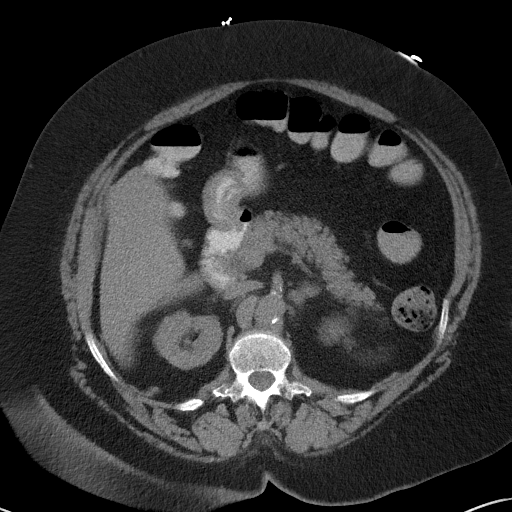
[im 61/85  bone]
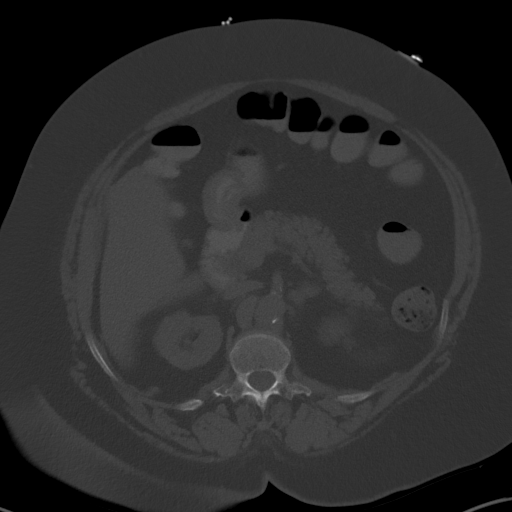
[im 68/85  soft-tissue]
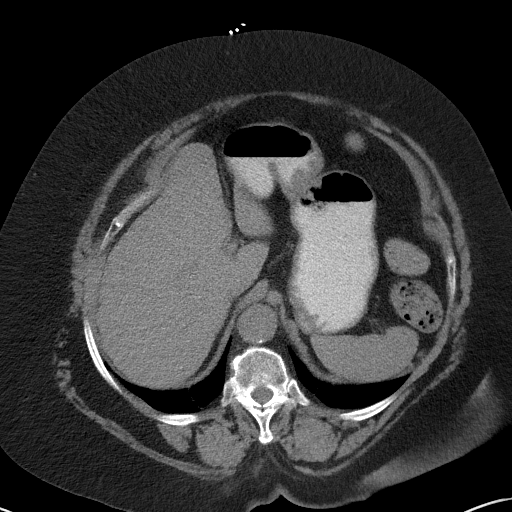
[im 74/85  soft-tissue]
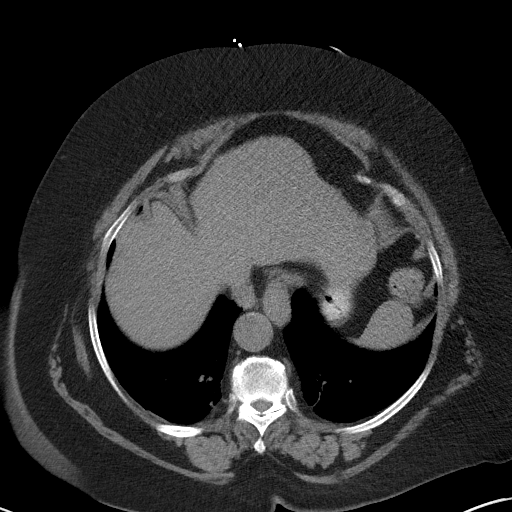
[im 81/85  soft-tissue]
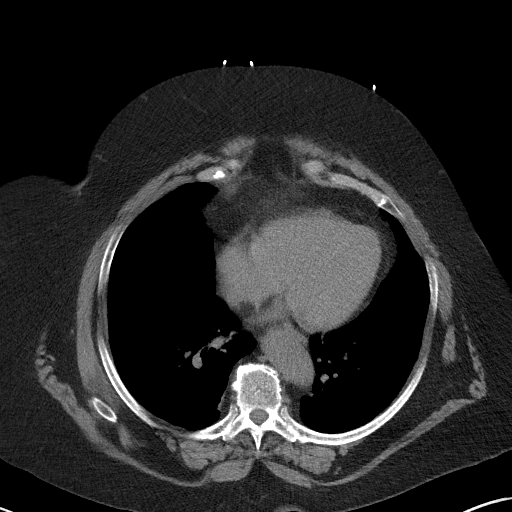

[Series 6: coronal st · coronal · 0.67mm/px · 3 of 96 slices shown]
[im 32/96  soft-tissue]
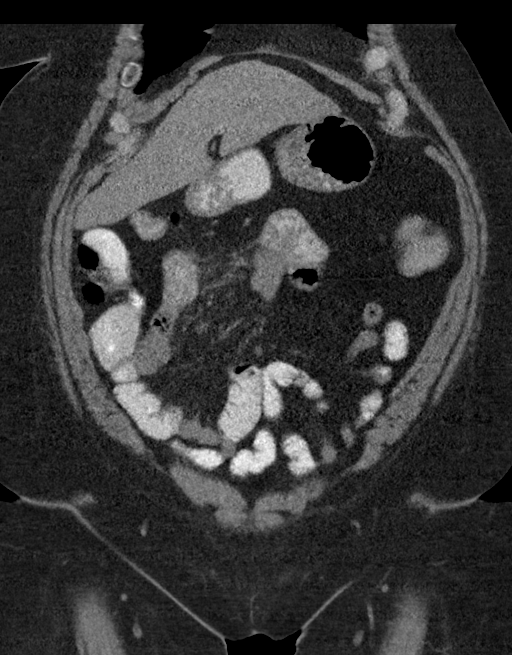
[im 43/96  soft-tissue]
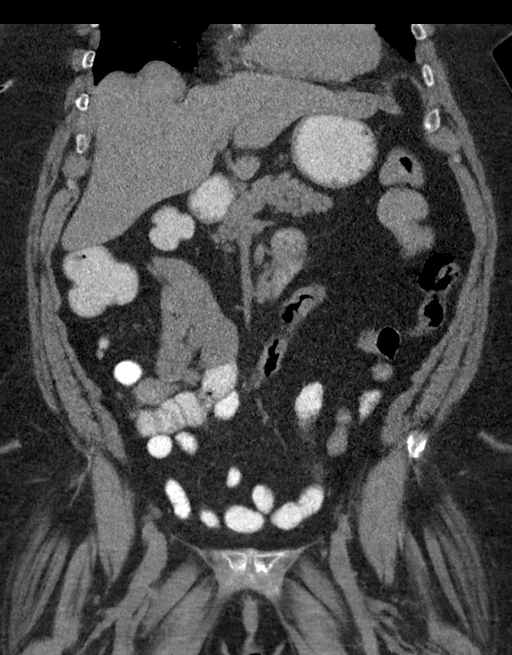
[im 53/96  soft-tissue]
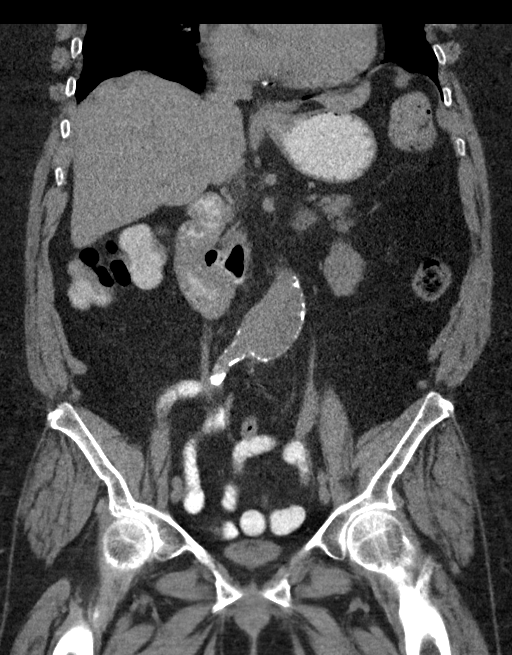

[15 of 46 positions shown; findings below may reference images not displayed]

FINDINGS: Lower chest: Minimal bibasilar linear atelectasis. Calcified plaque
over the right coronary artery.

Hepatobiliary: Previous cholecystectomy. The liver and biliary tree
are normal.

Pancreas: Normal.

Spleen: Normal.

Adrenals/Urinary Tract: Right adrenal gland is normal. There is an
oval left adrenal mass with minimal calcification measuring 1.9 cm
and Hounsfield unit measurements of less than 10 compatible with an
adenoma. Right kidney is normal in size with a punctate stone over
the lower pole collecting system. Left kidney is slightly small
without hydronephrosis or nephrolithiasis. Subcentimeter
hypodensities over the upper pole and lower pole left kidney too
small to characterize but likely cysts. Ureters and bladder are
normal.

Stomach/Bowel: Stomach is normal. Diverticula over the third portion
of the duodenum. Remainder of the small bowel is normal. Appendix is
normal. Colon is normal.

Vascular/Lymphatic: Mild calcified plaque over the abdominal aorta.
There is mild aneurysmal dilatation of the infrarenal abdominal
aorta which measures 4.0 x 4.3 cm in AP and transverse dimension. No
significant adenopathy.

Reproductive: Uterus and ovaries are normal.

Other: No free fluid or focal inflammatory change.

Musculoskeletal: Degenerative change of the spine and hips.
IMPRESSION: No acute findings in the abdomen/pelvis.

Infrarenal abdominal aortic aneurysm measuring 4.0 x 4.3 cm in AP
and transverse dimension. Recommend followup by ultrasound in 1
year. This recommendation follows ACR consensus guidelines: White
Paper of the ACR Incidental Findings Committee II on Vascular
Findings. [HOSPITAL] 6357; [DATE].

Aortic Atherosclerosis (ZWTB0-841.1).

Somewhat small left kidney with 2 subcentimeter hypodensities too
small to characterize but likely cysts. Nonobstructing punctate
stone over the lower pole right kidney.

1.9 cm left adrenal adenoma.

## 2019-11-14 NOTE — Progress Notes (Signed)
[facility] Consult Note Telephone: [phone]  Fax: [phone]  PATIENT NAME: Katherine [name] DOB: April 03, 1940 MRN: [id number]  PRIMARY CARE PROVIDER:Dr Hodges/[place] RESPONSIBLE PARTY:Self/Katherine [name] spouse [phone]  RECOMMENDATIONS and PLAN: 1.ACP: remains a full code; most form in ACP  2.Edema secondary toCHF, monitor weights, elevate  3.Shortness of breathsecondary toCHF, continue diuresing, inhalation therapy; Continuous O2,Continue daily weights  4.Palliative care encounter; Palliative medicine team will continue to support patient, patient's family, and medical team. Visit consisted of counseling and education dealing with the complex and emotionally intense issues of symptom management and palliative care in the setting of serious and potentially life-threatening illness  5. F/u 4 weeks or sooner if declines; high risk for re-hospitalization   I spent 60 minutes providing this consultation,  Start at 12:15pm. More than 50% of the time in this consultation was spent coordinating communication.   HISTORY OF PRESENT ILLNESS:  Katherine [name] is a 79 y.o. 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   Hospitalize 10 / 8 / 2020 to 10/13 / 2020 for shortness of breath on chronic oxygen with acute pulmonary embolism on Heparin transition to  Eliquis.  Hospitalized 11 / 08/2018 to 11 / 17 / 2020 for nosebleed, covid viral pneumonia, recent PE, history of UTI/ESBL, leukocytosis.  Hospitalized 11 / 20 / 2020 to 17 / 28 / 2020 for history of covid virus pneumonia recent infection, anemia, seizure disorder with suspected elevated CRP from sepsis UTI  Hospitalize 12 / 29 / 2020 to 1/2 / 2021 for acute encephalopathy unknown etiology, CT brain revealed small vessel ischemic disease however overall and revealing. Neurology saw continued Keppra, iron supplements, right hand pain.  Hospitalized 5/6 / 2021 to 5 / 9 / 2021 hospitalized for sepsis secondary to UTI with improve mental status, acute kidney injury on chronic kidney disease, worsening iron deficiency anemia discontinued Eliquis  Hospitalized 8 / 12/2019 to 8 / 15 / 2021 altered mental status generalized weakness and chills with workup significant for esbl, e-coli with prior history of esbl, E coli.  Ms Peale resides at [facility]. Ms Schomburg is bed-bound per choice. Ms. Caroll does require staff to turn, position, transfer mobility. Ms Crocker is ADL dependent including bathing, dressing, toileting. Ms Luckow does feed herself and appetite has been fair. Ms Attridge does remain on continuous oxygen. Staff endorses no new changes are concerns. No recent wounds, infections, falls. Ms Fahr is a full code. At present time Ms Storbeck is lying in bed asleep, appears chronically ill, debilitated but no distress. I attempted to wake Ms. Duan for palliative care visit. Ms Pevey was very groggy, lethargic. It was difficult to get her awake. Ms Whitehorn did have wheezing with auscultation. Ms Butner did finally wake up and was interactive appeared somewhat confused saying to leave her alone and let her sleep. Asked nursing to come to reassess. Nursing endorses they have seen Ms Coletti have times where she has been very lethargic as she has had a biscuit  with gravy earlier for breakfast that  family brought in. Ms Bermea did wake up more and was interactive. Ms Lukasik wish to continue aggressive interventions with full scope of treatment. Ms Vukelich endorses she was tired and wanted to go to sleep. Nebulizer treatment given. Medical goals reviewed. No new changes that this time two goals of care. I have attempted to contact her husband for update on palliative care visit. I updated nursing staff in the new changes at present time. Will follow up in 4 weeks if needed or sooner should she decline as she is a high risk for rehabilitation.  Palliative Care was asked to help to continue to address goals of care.   CODE STATUS: DNR  PPS: 30% HOSPICE ELIGIBILITY/DIAGNOSIS: TBD  PAST MEDICAL HISTORY:  Past Medical History:  Diagnosis Date  . Angioedema   . Anxiety and depression   . CHF (congestive heart failure) ([place])   . Chronic constipation   . Chronic kidney disease    stage 3  . COPD (chronic obstructive pulmonary disease) ([place])   . Diabetes mellitus without complication ([place])   . Gallstones   . GERD (gastroesophageal reflux disease)   . Hypertension   . Left ventricular hypertrophy   . Osteoarthritis   . Prurigo nodularis   . Seizures ([place])   . Stroke ([place])   . Tobacco abuse   . Vitamin D deficiency disease     SOCIAL HX:  Social History   Tobacco Use  . Smoking status: Former Smoker    Packs/day: 0.50    Years: 60.00    Pack years: 30.00    Types: Cigarettes  . Smokeless tobacco: Never Used  . Tobacco comment: quite 3mo ago-03/26/18  Substance Use Topics  . Alcohol use: No    Alcohol/week: 0.0 standard drinks    Comment: rare    ALLERGIES:  Allergies  Allergen Reactions  . Bee Venom Swelling  . Enalapril Maleate Swelling     PERTINENT MEDICATIONS:  Outpatient Encounter Medications as of 11/14/2019  Medication Sig  . acetaminophen (TYLENOL) 650 MG CR tablet Take 650 mg by mouth 3 (three) times daily.   [name] albuterol  (ACCUNEB) 0.63 MG/3ML nebulizer solution Take 3 mLs by nebulization every 8 (eight) hours as needed for wheezing.   [name] atorvastatin (LIPITOR) 80 MG tablet Take 80 mg by mouth every evening.   . cetirizine (ZYRTEC) 10 MG tablet Take 10 mg by mouth daily.   . cholecalciferol (VITAMIN D) 1000 units tablet Take 1,000 Units by mouth daily.  [name] docusate sodium (COLACE) 100 MG capsule Take 100 mg by mouth 2 (two) times daily.  . ferrous sulfate 325 (65 FE) MG tablet Take 325 mg by mouth 2 (two) times daily.  . Fluticasone-Umeclidin-Vilant 100-62.5-25 MCG/INH AEPB Inhale 1 puff into the lungs daily.  [name] gabapentin (NEURONTIN) 300 MG capsule Take 300 mg by mouth at bedtime.  [name] ipratropium-albuterol (DUONEB) 0.5-2.5 (3) MG/3ML SOLN Take 3 mLs by nebulization 3 (three) times daily.  [name] lacosamide 100 MG TABS Take 1 tablet (100 mg total) by mouth 2 (two) times daily.  [name] levETIRAcetam (KEPPRA) 750 MG tablet Take 750 mg by mouth daily.   [name] lidocaine (LIDODERM) 5 % Place 1 patch onto the skin every 12 (twelve) hours as needed. Remove & Discard patch within 12 hours or as directed by MD  . linagliptin (TRADJENTA) 5 MG TABS tablet Take 5 mg by mouth daily.  . meclizine (ANTIVERT) 25 MG tablet Take 25 mg by mouth every 12 (twelve) hours as needed  for dizziness.   . Menthol, Topical Analgesic, (BIOFREEZE) 4 % GEL Apply 1 application topically in the morning and at bedtime. To both knees   . mupirocin ointment (BACTROBAN) 2 % Place 1 application into the nose 2 (two) times daily.  . nitroGLYCERIN (NITROSTAT) 0.4 MG SL tablet Place 0.4 mg under the tongue every 5 (five) minutes as needed for chest pain.   . pantoprazole (PROTONIX) 40 MG tablet Take 1 tablet (40 mg total) by mouth daily.  . Saccharomyces boulardii (PROBIOTIC) 250 MG CAPS Take 1 capsule by mouth 2 times daily at 12 noon and 4 pm. (Patient taking differently: Take 1 capsule by mouth 2 (two) times daily. )  . Saline GEL Place 3 sprays into the nose 3 (three)  times daily.   . vitamin C (VITAMIN C) 500 MG tablet Take 1 tablet (500 mg total) by mouth daily.  [name] zinc sulfate 220 (50 Zn) MG capsule Take 1 capsule (220 mg total) by mouth daily.   No facility-administered encounter medications on file as of 11/14/2019.    PHYSICAL EXAM:   General: obese, O2 dependent, chronically ill, female Cardiovascular: regular rate and rhythm Pulmonary: wheezing, decrease bases Abdomen: obese soft, nontender, + bowel sounds Neurological: functional quadriplegic  Shuayb Schepers Ihor Gully, NP

## 2019-11-17 DIAGNOSIS — L98492 Non-pressure chronic ulcer of skin of other sites with fat layer exposed: Secondary | ICD-10-CM | POA: Diagnosis not present

## 2019-11-17 DIAGNOSIS — S01301D Unspecified open wound of right ear, subsequent encounter: Secondary | ICD-10-CM | POA: Diagnosis not present

## 2019-11-20 ENCOUNTER — Non-Acute Institutional Stay: Payer: Medicare HMO | Admitting: Nurse Practitioner

## 2019-11-20 ENCOUNTER — Other Ambulatory Visit: Payer: Self-pay

## 2019-11-20 ENCOUNTER — Encounter: Payer: Self-pay | Admitting: Nurse Practitioner

## 2019-11-20 DIAGNOSIS — L98492 Non-pressure chronic ulcer of skin of other sites with fat layer exposed: Secondary | ICD-10-CM | POA: Diagnosis not present

## 2019-11-20 DIAGNOSIS — Q2546 Tortuous aortic arch: Secondary | ICD-10-CM | POA: Diagnosis not present

## 2019-11-20 DIAGNOSIS — I509 Heart failure, unspecified: Secondary | ICD-10-CM | POA: Diagnosis not present

## 2019-11-20 DIAGNOSIS — Z515 Encounter for palliative care: Secondary | ICD-10-CM

## 2019-11-20 DIAGNOSIS — J9811 Atelectasis: Secondary | ICD-10-CM | POA: Diagnosis not present

## 2019-11-20 NOTE — Progress Notes (Signed)
[facility] Consult Note Telephone: [phone]  Fax: [phone]  PATIENT NAME: Katherine [name] DOB: [birth year] MRN: [id number] PRIMARY CARE PROVIDER:Dr Hodges/[place] RESPONSIBLE PARTY:Self/[name] spouse [phone]  RECOMMENDATIONS and PLAN: 1.ACP: remains a full code; most form in ACP  2.Edema secondary toCHF, monitor weights, elevate  3.Shortness of breathsecondary toCHF, continue diuresing, inhalation therapy; Continuous O2,Continue daily weights  4.Palliative care encounter; Palliative medicine team will continue to support patient, patient's family, and medical team. Visit consisted of counseling and education dealing with the complex and emotionally intense issues of symptom management and palliative care in the setting of serious and potentially life-threatening illness  5. F/u 2 weeks or sooner if declines; high risk for re-hospitalization  I spent 60 minutes providing this consultation, start at 2: 30pm. More than 50% of the time in this consultation was spent coordinating communication.   HISTORY OF PRESENT ILLNESS:  Katherine [name] is a 79 y.o. 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 Ms Bultman remains a total ADL assist for  turning in positioning, dressing, bathing, toileting. Ms. Adolph does feed herself with assistance with tray setup. Appetite has been there. Ms Gladue does where oxygen continuously. Ms. Zito is able to advocate for her needs. Ms Dilley is a full code with aggressive interventions. Last Palliative care visit Ms Locatelli was very sleepy and return today to revisit goals of care. Staff endorses no other changes noted. No recent falls, infections. At present Ms Gunnarson is lying in bed, appears to be elevated, chronically ill, mildly increased work of breathing with speaking. Ms Flavell endorses she is not feeling quite well today. We talked about shortness of breath and her continuous oxygen. Auscultated rales, asked nurse to come for assessment. We talked about medical goals of care. We talked about her full code status. We talked about hospitalization. Ms Atkerson endorses she wants to do what she can at the facility to try to prevent rehospitalization. We talked about contacting Berenice Bouton PA orders for labs, chest x-ray and possible more diuresing appears to be congestive heart failure exacerbation. We talked about her appetite. Ms Schubach in agreement with plan of care. We talked about medical goals further and she does wish to remain a full code. Ms Marland was cooperative with assessment. We talked about quality of life. Emotional support provided. Manuela Schwartz, unit manager updated and will continue to proceed with orders. No other changes at this time. Will follow up in 2 weeks or sooner should she declined. I have attempted to contact her husband, unable to leave a message.  Palliative Care was asked to help to continue to address goals of care.   CODE STATUS: Full code  PPS: 30% HOSPICE ELIGIBILITY/DIAGNOSIS: TBD  PAST MEDICAL HISTORY:  Past Medical History:  Diagnosis Date  . Angioedema   . Anxiety and depression   . CHF (congestive heart failure) ([place])   . Chronic constipation   . Chronic kidney disease    stage 3   . COPD (chronic obstructive pulmonary  disease) ([place])   . Diabetes mellitus without complication ([place])   . Gallstones   . GERD (gastroesophageal reflux disease)   . Hypertension   . Left ventricular hypertrophy   . Osteoarthritis   . Prurigo nodularis   . Seizures ([place])   . Stroke ([place])   . Tobacco abuse   . Vitamin D deficiency disease     SOCIAL HX:  Social History   Tobacco Use  . Smoking status: Former Smoker    Packs/day: 0.50    Years: 60.00    Pack years: 30.00    Types: Cigarettes  . Smokeless tobacco: Never Used  . Tobacco comment: quite 20mo ago-03/26/18  Substance Use Topics  . Alcohol use: No    Alcohol/week: 0.0 standard drinks    Comment: rare    ALLERGIES:  Allergies  Allergen Reactions  . Bee Venom Swelling  . Enalapril Maleate Swelling     PERTINENT MEDICATIONS:  Outpatient Encounter Medications as of 11/20/2019  Medication Sig  . acetaminophen (TYLENOL) 650 MG CR tablet Take 650 mg by mouth 3 (three) times daily.   [name] albuterol (ACCUNEB) 0.63 MG/3ML nebulizer solution Take 3 mLs by nebulization every 8 (eight) hours as needed for wheezing.   [name] atorvastatin (LIPITOR) 80 MG tablet Take 80 mg by mouth every evening.   . cetirizine (ZYRTEC) 10 MG tablet Take 10 mg by mouth daily.   . cholecalciferol (VITAMIN D) 1000 units tablet Take 1,000 Units by mouth daily.  [name] docusate sodium (COLACE) 100 MG capsule Take 100 mg by mouth 2 (two) times daily.  . ferrous sulfate 325 (65 FE) MG tablet Take 325 mg by mouth 2 (two) times daily.  . Fluticasone-Umeclidin-Vilant 100-62.5-25 MCG/INH AEPB Inhale 1 puff into the lungs daily.  [name] gabapentin (NEURONTIN) 300 MG capsule Take 300 mg by mouth at bedtime.  [name] ipratropium-albuterol (DUONEB) 0.5-2.5 (3) MG/3ML SOLN Take 3 mLs by nebulization 3 (three) times daily.  [name] lacosamide 100 MG TABS Take 1 tablet (100 mg total) by mouth 2 (two) times daily.  [name] levETIRAcetam (KEPPRA) 750 MG tablet Take 750 mg by mouth daily.   [name]  lidocaine (LIDODERM) 5 % Place 1 patch onto the skin every 12 (twelve) hours as needed. Remove & Discard patch within 12 hours or as directed by MD  . linagliptin (TRADJENTA) 5 MG TABS tablet Take 5 mg by mouth daily.  . meclizine (ANTIVERT) 25 MG tablet Take 25 mg by mouth every 12 (twelve) hours as needed for dizziness.   . Menthol, Topical Analgesic, (BIOFREEZE) 4 % GEL Apply 1 application topically in the morning and at bedtime. To both knees   . mupirocin ointment (BACTROBAN) 2 % Place 1 application into the nose 2 (two) times daily.  . nitroGLYCERIN (NITROSTAT) 0.4 MG SL tablet Place 0.4 mg under the tongue every 5 (five) minutes as needed for chest pain.   . pantoprazole (PROTONIX) 40 MG tablet Take 1 tablet (40 mg total) by mouth daily.  . Saccharomyces boulardii (PROBIOTIC) 250 MG CAPS Take 1 capsule by mouth 2 times daily at 12 noon and 4 pm. (Patient taking differently: Take 1 capsule by mouth 2 (two) times daily. )  . Saline GEL Place 3 sprays into the nose 3 (three) times daily.   . vitamin C (VITAMIN C) 500 MG tablet Take 1 tablet (500 mg total) by mouth daily.  [name] zinc sulfate 220 (50 Zn) MG capsule Take 1 capsule (220 mg total) by mouth daily.   No  facility-administered encounter medications on file as of 11/20/2019.    PHYSICAL EXAM:   General: obese, debilitated, chronically ill, O2 dependent Cardiovascular: regular rate and rhythm Pulmonary: rales throughout, decrease bases Neurological: functionally quadriplegic  Ryiah Bellissimo Ihor Gully, NP

## 2019-11-24 DIAGNOSIS — M25562 Pain in left knee: Secondary | ICD-10-CM | POA: Diagnosis not present

## 2019-11-24 DIAGNOSIS — M1712 Unilateral primary osteoarthritis, left knee: Secondary | ICD-10-CM | POA: Diagnosis not present

## 2019-11-24 DIAGNOSIS — S01301D Unspecified open wound of right ear, subsequent encounter: Secondary | ICD-10-CM | POA: Diagnosis not present

## 2019-11-25 DIAGNOSIS — N390 Urinary tract infection, site not specified: Secondary | ICD-10-CM | POA: Diagnosis not present

## 2019-11-26 IMAGING — DX DG CHEST 1V PORT
1 series · 1 of 1 positions shown · non-contrast
Comparison: April 20, 2017

CLINICAL DATA: Shortness of Breath

EXAM:
PORTABLE CHEST 1 VIEW

[chest ap]
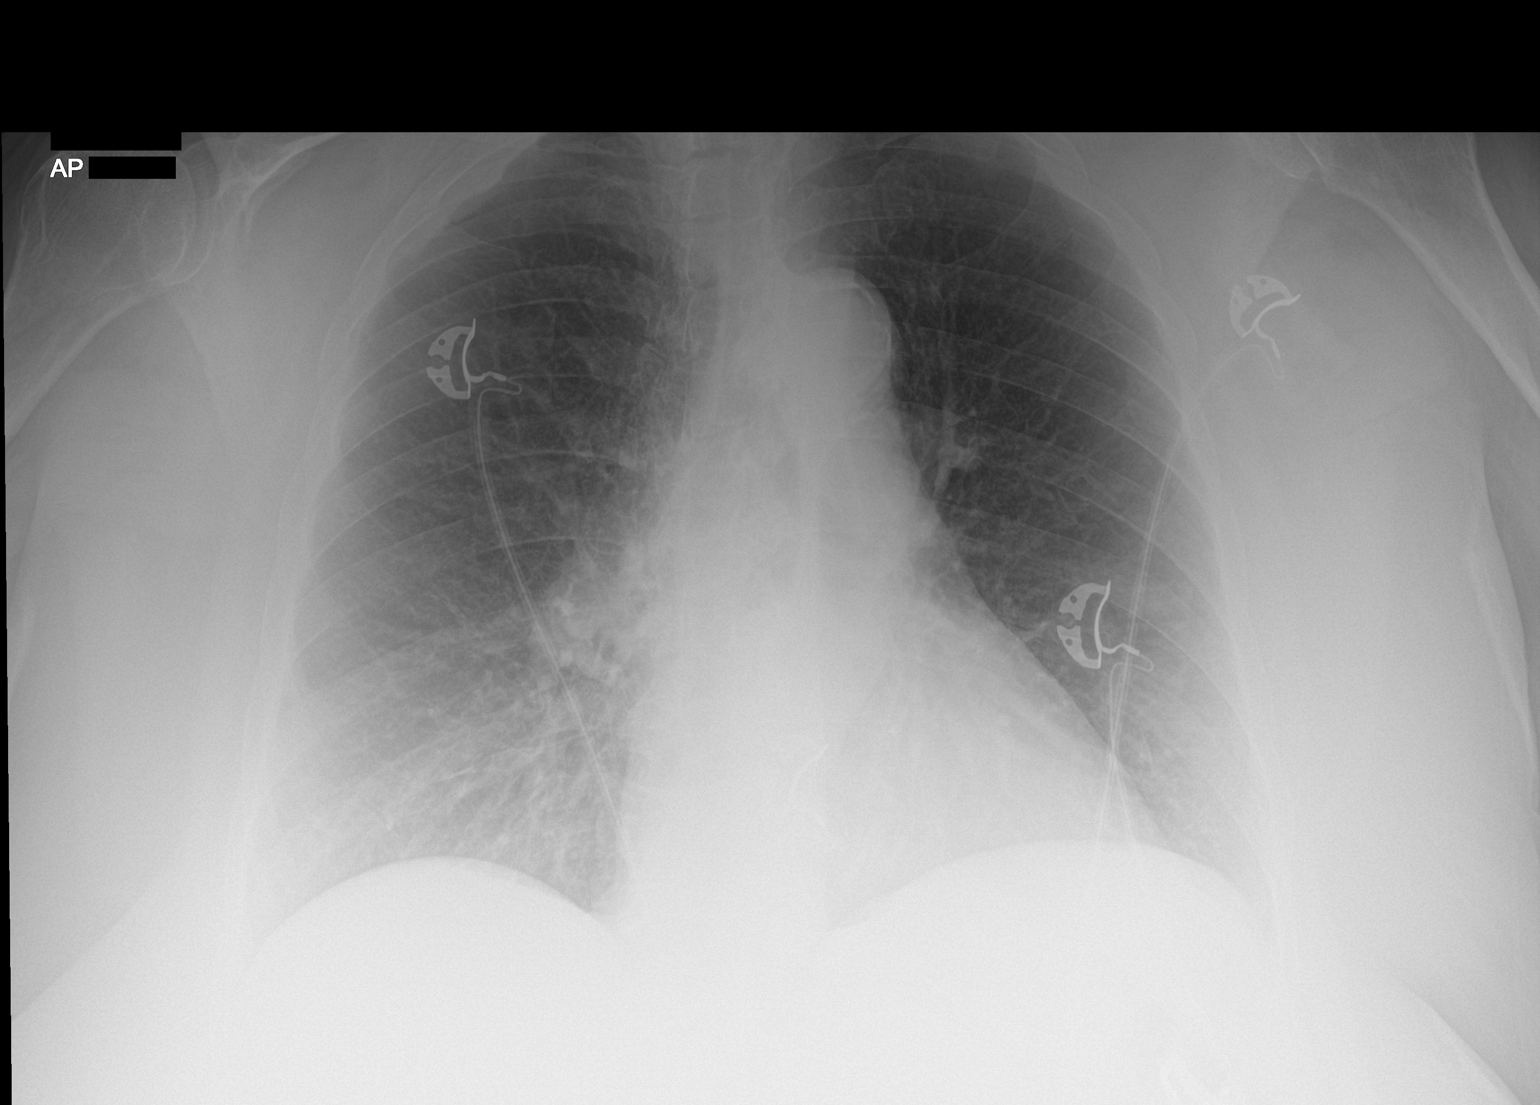

[1 of 1 positions shown; findings below may reference images not displayed]

FINDINGS: There is no edema or consolidation. The heart size and pulmonary
vascularity are normal. No adenopathy. There is aortic
atherosclerosis. No evident bone lesions.
IMPRESSION: Aortic atherosclerosis.  No edema or consolidation.

Aortic Atherosclerosis ([id number]-[id number]).

## 2019-12-01 DIAGNOSIS — S01301D Unspecified open wound of right ear, subsequent encounter: Secondary | ICD-10-CM | POA: Diagnosis not present

## 2019-12-06 IMAGING — DX DG CHEST 1V PORT
1 series · 1 of 1 positions shown · non-contrast
Comparison: Chest x-rays dated 05/16/2017, 04/05/2017 and
11/28/2015.

CLINICAL DATA: Chest pain and shortness of breath.

EXAM:
PORTABLE CHEST 1 VIEW

[chest ap]
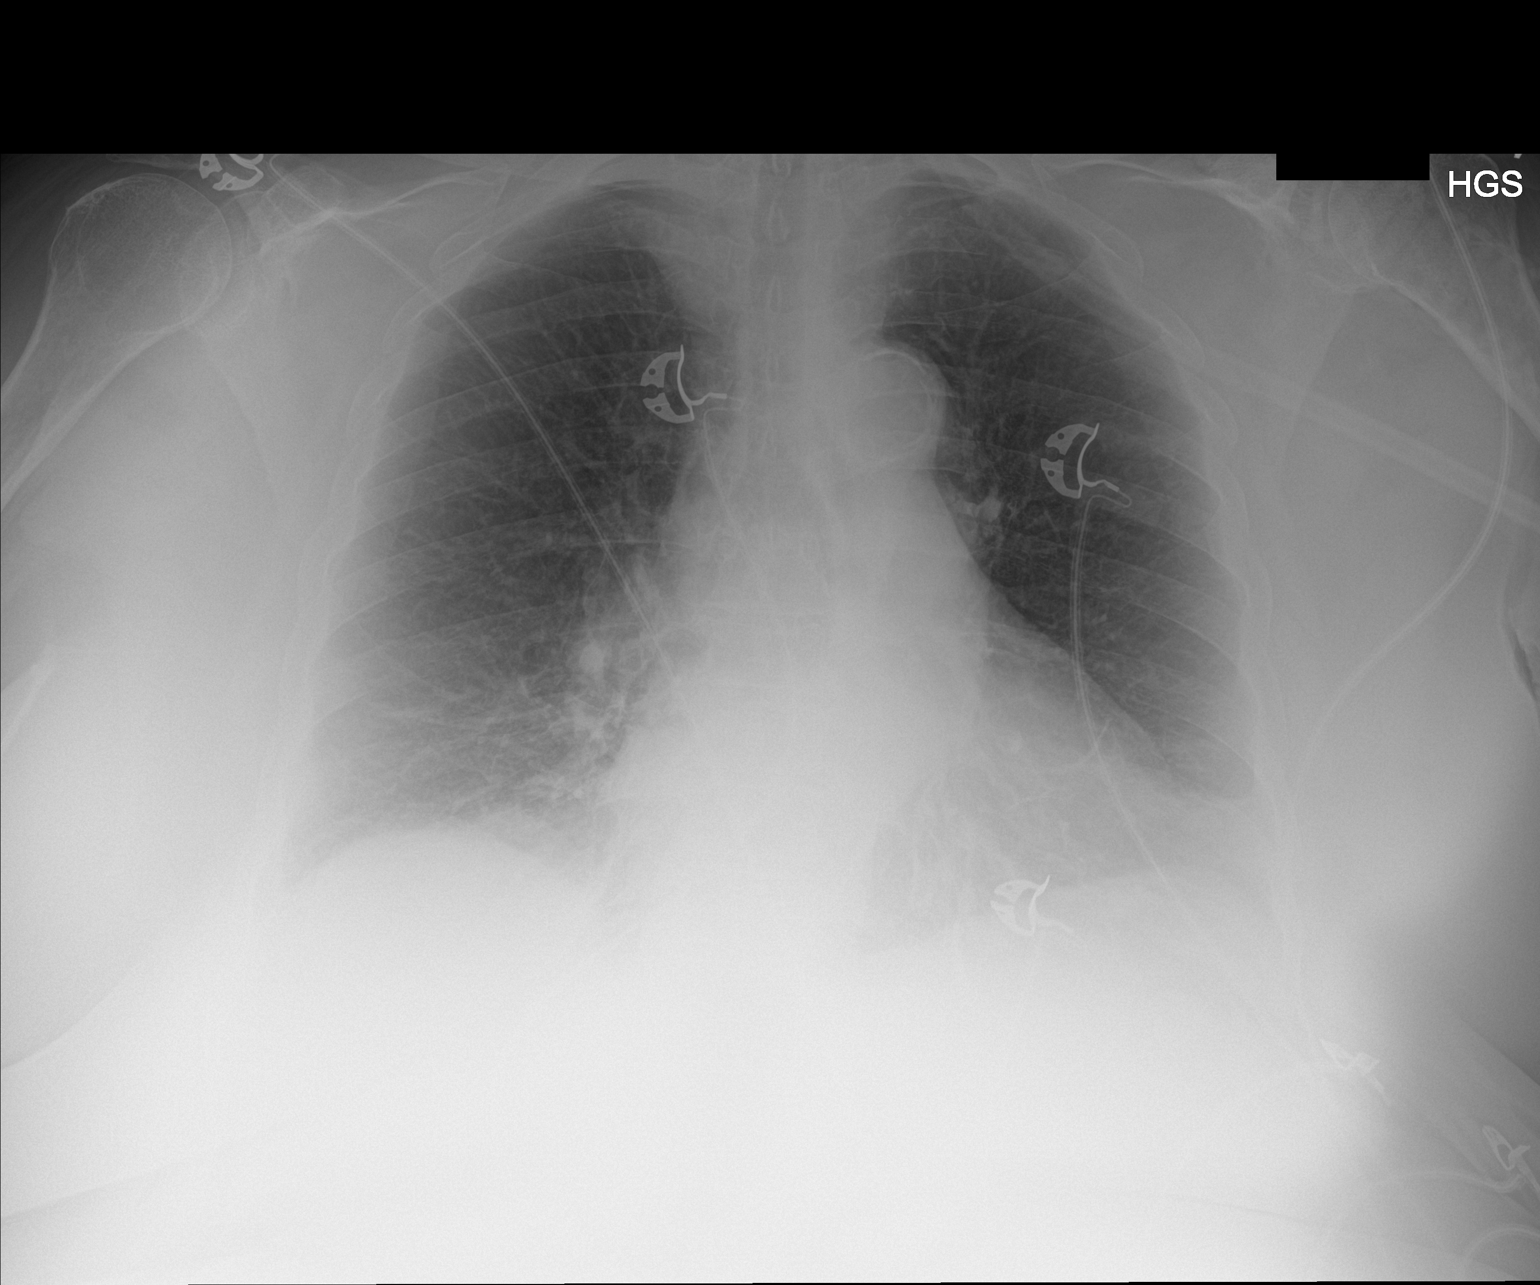

[1 of 1 positions shown; findings below may reference images not displayed]

FINDINGS: Stable mild cardiomegaly. Aortic atherosclerosis. Lungs are clear.
No pleural effusion or pneumothorax seen. No acute or suspicious
osseous finding.
IMPRESSION: 1. Stable chest x-ray. No active disease. No evidence of pneumonia
or pulmonary edema.
2. Mild cardiomegaly.
3. Aortic atherosclerosis.

## 2019-12-06 IMAGING — NM NM PULMONARY VENT & PERF
2 series · 16 of 16 positions shown · non-contrast
Comparison: Correlation with chest radiograph dated 05/26/2017

CLINICAL DATA: Shortness of breath, chest pain, severe COPD,
evaluate for PE

EXAM:
NUCLEAR MEDICINE VENTILATION - PERFUSION LUNG SCAN
TECHNIQUE: Ventilation images were obtained in multiple projections using
inhaled aerosol Bc-KKm DTPA. Perfusion images were obtained in
multiple projections after intravenous injection of [id number].
RADIOPHARMACEUTICALS:  27.9 mCi of Bc-KKm DTPA aerosol inhalation
and 4.0 mCi [id number]-[id number] IV

[Series 1000: lung perfusion · 1.95mm/px · 4 acquisitions, 8 frames shown]
[im 1/4]
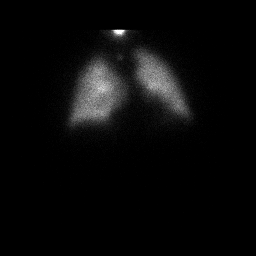
[im 1/4]
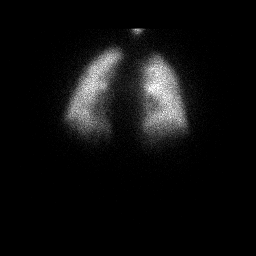
[im 2/4]
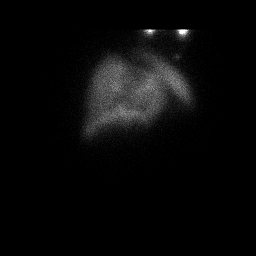
[im 2/4]
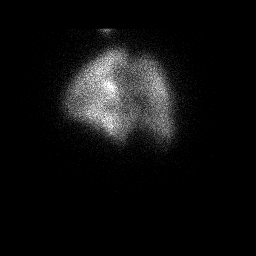
[im 3/4]
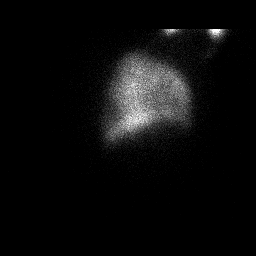
[im 3/4]
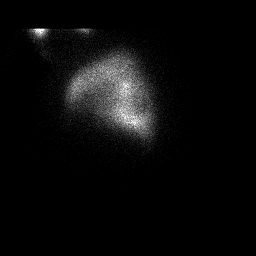
[im 4/4]
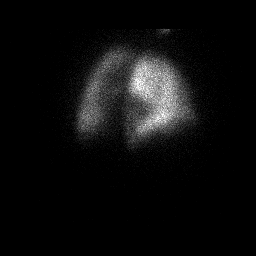
[im 4/4]
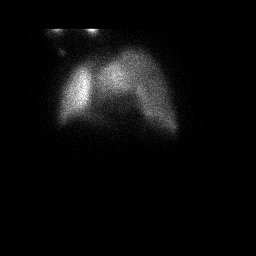

[Series 1000: lung ventilation · 3.90mm/px · 4 acquisitions, 8 frames shown]
[im 1/4]
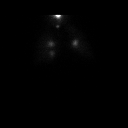
[im 1/4]
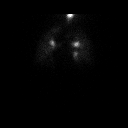
[im 2/4]
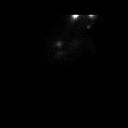
[im 2/4]
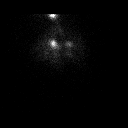
[im 3/4]
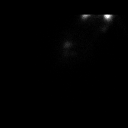
[im 3/4]
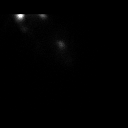
[im 4/4]
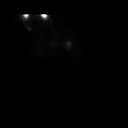
[im 4/4]
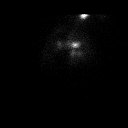

[16 of 16 positions shown; findings below may reference images not displayed]

FINDINGS: Ventilation: Clumping of radiotracer in the central airways, likely
related to the patient's severe COPD.

Perfusion: No wedge shaped peripheral perfusion defects to suggest
acute pulmonary embolism.

On corresponding chest radiograph, the lungs are clear.
IMPRESSION: Negative for pulmonary embolism.

## 2019-12-08 DIAGNOSIS — J449 Chronic obstructive pulmonary disease, unspecified: Secondary | ICD-10-CM | POA: Diagnosis not present

## 2019-12-08 DIAGNOSIS — S01301D Unspecified open wound of right ear, subsequent encounter: Secondary | ICD-10-CM | POA: Diagnosis not present

## 2019-12-22 DIAGNOSIS — N390 Urinary tract infection, site not specified: Secondary | ICD-10-CM | POA: Diagnosis not present

## 2019-12-23 DIAGNOSIS — N390 Urinary tract infection, site not specified: Secondary | ICD-10-CM | POA: Diagnosis not present

## 2019-12-23 IMAGING — DX DG CHEST 1V PORT
1 series · 1 of 1 positions shown · non-contrast
Comparison: May 26, 2017

CLINICAL DATA: Shortness of breath and cough

EXAM:
PORTABLE CHEST 1 VIEW

[chest ap]
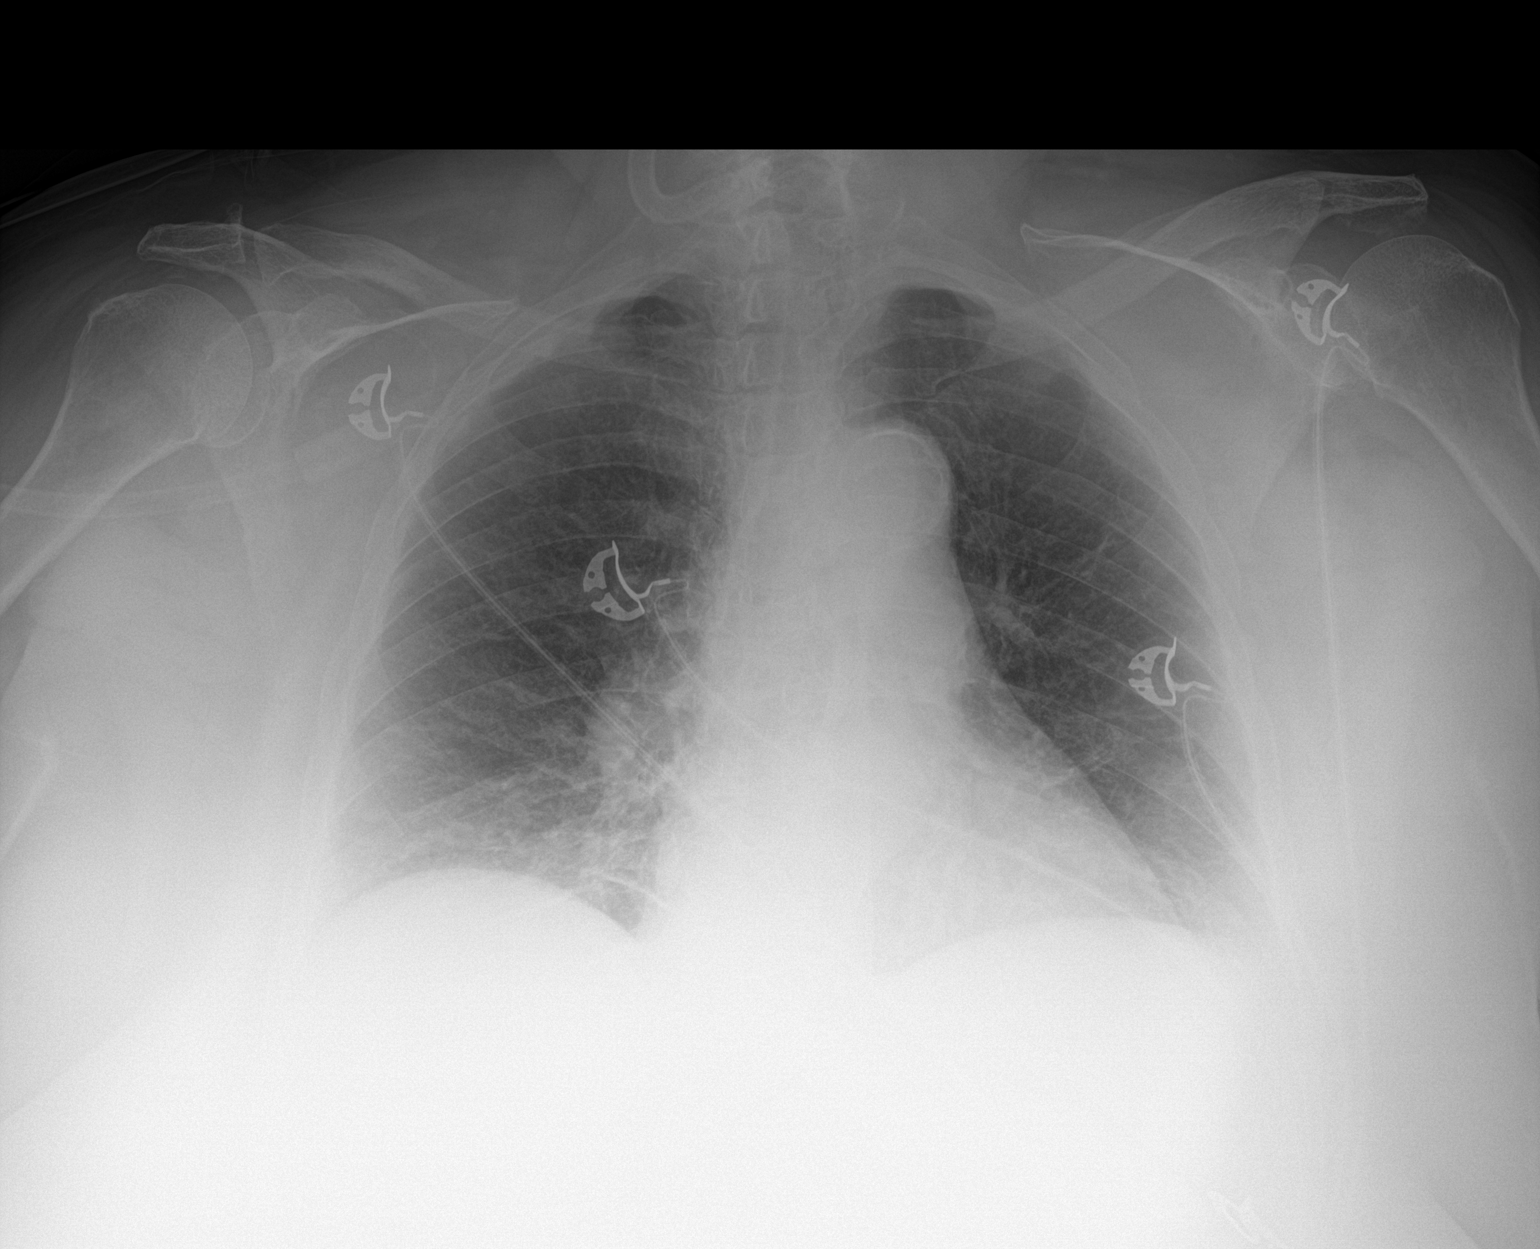

[1 of 1 positions shown; findings below may reference images not displayed]

FINDINGS: There is no appreciable edema or consolidation. There is slight
bibasilar atelectasis. Heart size and pulmonary vascularity are
normal. There is aortic atherosclerosis. No adenopathy. No bone
lesions.
IMPRESSION: Slight bibasilar atelectasis. No edema or consolidation. Stable
cardiac silhouette. There is aortic atherosclerosis.

Aortic Atherosclerosis ([id number]-[id number].T).

## 2019-12-24 IMAGING — DX DG CHEST 1V PORT
1 series · 1 of 1 positions shown · non-contrast
Comparison: June 12, 2017

CLINICAL DATA: Difficulty breathing

EXAM:
PORTABLE CHEST 1 VIEW

[chest ap]
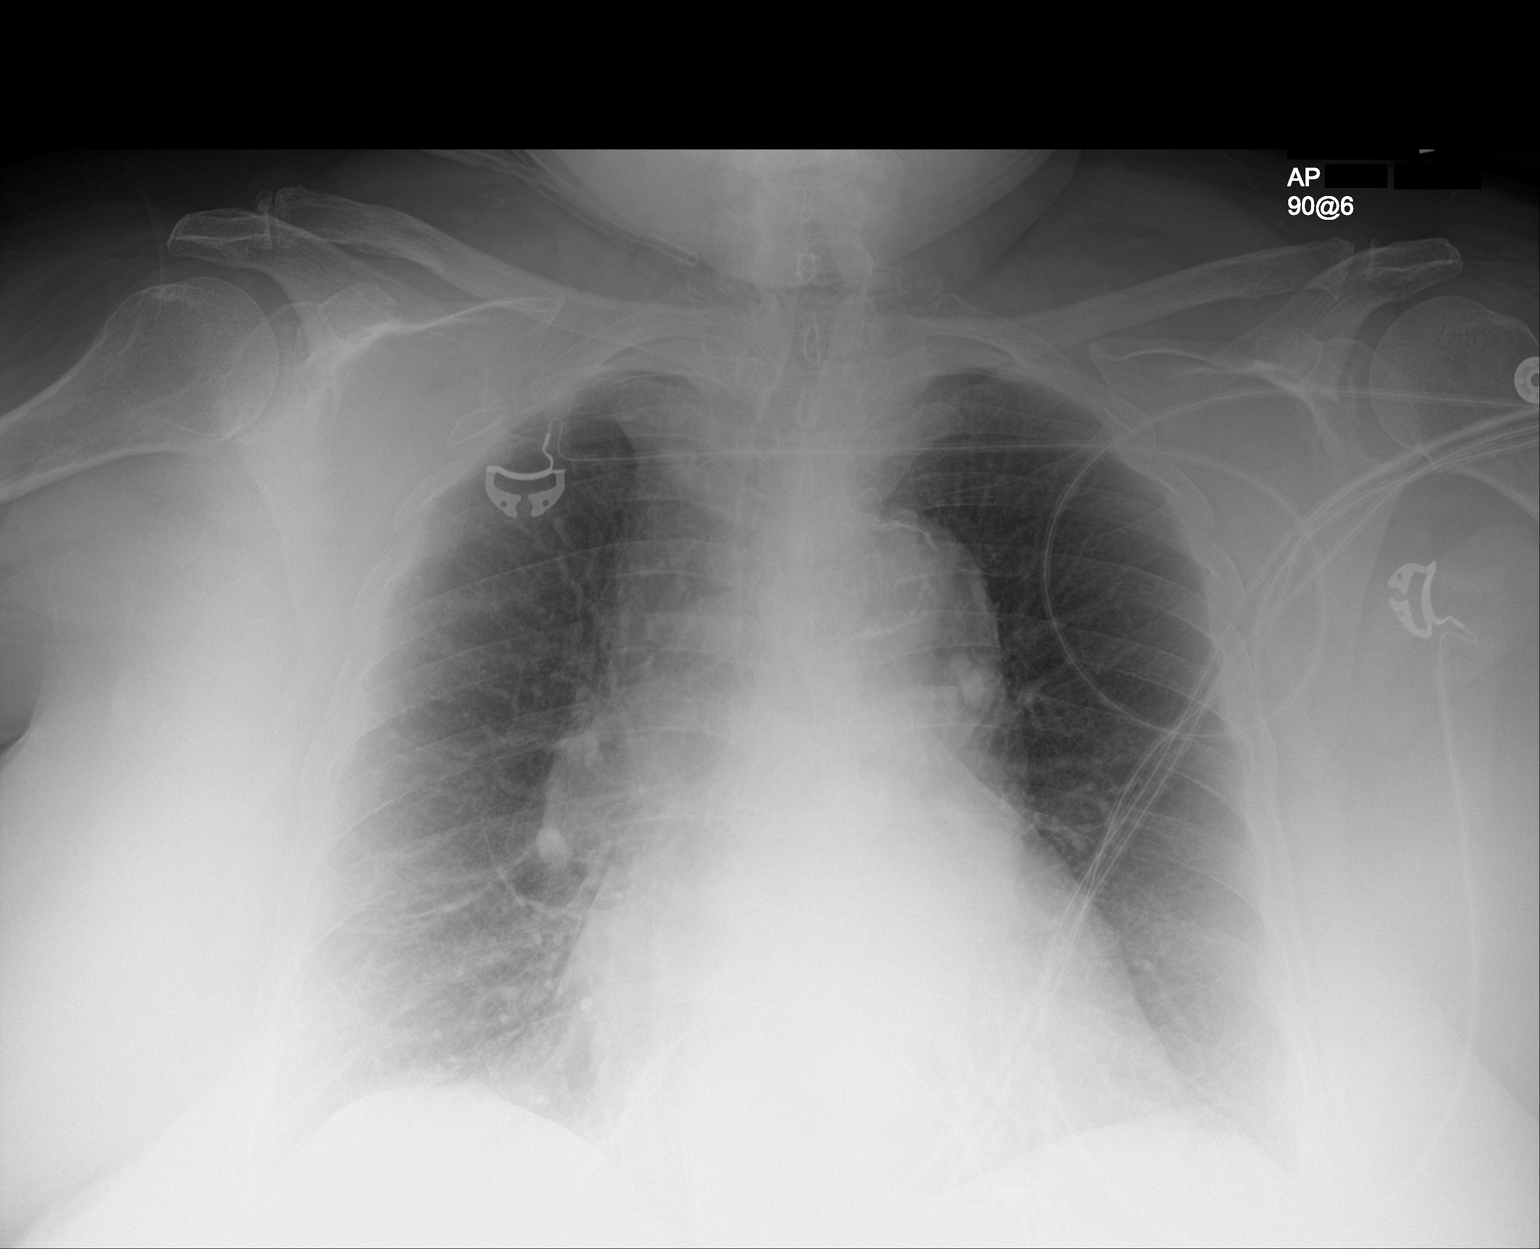

[1 of 1 positions shown; findings below may reference images not displayed]

FINDINGS: There is mild atelectatic change in the right lower lobe. There is
no edema or consolidation. Heart is upper normal size with pulmonary
vascularity within normal limits. There is aortic atherosclerosis.
No adenopathy. No bone lesions.
IMPRESSION: Mild right lower lobe atelectatic change. No edema or consolidation.
Heart upper normal in size. There is aortic atherosclerosis.

Aortic Atherosclerosis ([id number].T).

## 2019-12-28 ENCOUNTER — Inpatient Hospital Stay
Admission: EM | Admit: 2019-12-28 | Discharge: 2019-12-30 | DRG: 189 | Disposition: A | Discharge status: Skilled Nursing Facility | Payer: Medicare HMO | Source: Skilled Nursing Facility | Attending: Obstetrics and Gynecology | Admitting: Obstetrics and Gynecology

## 2019-12-28 ENCOUNTER — Emergency Department: Payer: Medicare HMO

## 2019-12-28 ENCOUNTER — Other Ambulatory Visit: Payer: Self-pay

## 2019-12-28 DIAGNOSIS — E860 Dehydration: Secondary | ICD-10-CM | POA: Diagnosis present

## 2019-12-28 DIAGNOSIS — Z832 Family history of diseases of the blood and blood-forming organs and certain disorders involving the immune mechanism: Secondary | ICD-10-CM

## 2019-12-28 DIAGNOSIS — R404 Transient alteration of awareness: Secondary | ICD-10-CM | POA: Diagnosis not present

## 2019-12-28 DIAGNOSIS — J9620 Acute and chronic respiratory failure, unspecified whether with hypoxia or hypercapnia: Secondary | ICD-10-CM | POA: Diagnosis present

## 2019-12-28 DIAGNOSIS — J441 Chronic obstructive pulmonary disease with (acute) exacerbation: Secondary | ICD-10-CM | POA: Diagnosis not present

## 2019-12-28 DIAGNOSIS — R4182 Altered mental status, unspecified: Secondary | ICD-10-CM

## 2019-12-28 DIAGNOSIS — I444 Left anterior fascicular block: Secondary | ICD-10-CM | POA: Diagnosis present

## 2019-12-28 DIAGNOSIS — E872 Acidosis: Secondary | ICD-10-CM | POA: Diagnosis present

## 2019-12-28 DIAGNOSIS — Z8249 Family history of ischemic heart disease and other diseases of the circulatory system: Secondary | ICD-10-CM

## 2019-12-28 DIAGNOSIS — G9349 Other encephalopathy: Secondary | ICD-10-CM | POA: Diagnosis not present

## 2019-12-28 DIAGNOSIS — G40909 Epilepsy, unspecified, not intractable, without status epilepticus: Secondary | ICD-10-CM | POA: Diagnosis present

## 2019-12-28 DIAGNOSIS — K219 Gastro-esophageal reflux disease without esophagitis: Secondary | ICD-10-CM | POA: Diagnosis present

## 2019-12-28 DIAGNOSIS — R5381 Other malaise: Secondary | ICD-10-CM | POA: Diagnosis not present

## 2019-12-28 DIAGNOSIS — Z8673 Personal history of transient ischemic attack (TIA), and cerebral infarction without residual deficits: Secondary | ICD-10-CM | POA: Diagnosis not present

## 2019-12-28 DIAGNOSIS — B9620 Unspecified Escherichia coli [E. coli] as the cause of diseases classified elsewhere: Secondary | ICD-10-CM | POA: Diagnosis present

## 2019-12-28 DIAGNOSIS — E669 Obesity, unspecified: Secondary | ICD-10-CM | POA: Diagnosis present

## 2019-12-28 DIAGNOSIS — Z9981 Dependence on supplemental oxygen: Secondary | ICD-10-CM | POA: Diagnosis not present

## 2019-12-28 DIAGNOSIS — N179 Acute kidney failure, unspecified: Secondary | ICD-10-CM | POA: Diagnosis present

## 2019-12-28 DIAGNOSIS — R279 Unspecified lack of coordination: Secondary | ICD-10-CM | POA: Diagnosis not present

## 2019-12-28 DIAGNOSIS — J9622 Acute and chronic respiratory failure with hypercapnia: Principal | ICD-10-CM | POA: Diagnosis present

## 2019-12-28 DIAGNOSIS — I5032 Chronic diastolic (congestive) heart failure: Secondary | ICD-10-CM | POA: Diagnosis present

## 2019-12-28 DIAGNOSIS — J9621 Acute and chronic respiratory failure with hypoxia: Secondary | ICD-10-CM | POA: Diagnosis present

## 2019-12-28 DIAGNOSIS — Z20822 Contact with and (suspected) exposure to covid-19: Secondary | ICD-10-CM | POA: Diagnosis not present

## 2019-12-28 DIAGNOSIS — M2548 Effusion, other site: Secondary | ICD-10-CM | POA: Diagnosis not present

## 2019-12-28 DIAGNOSIS — R0689 Other abnormalities of breathing: Secondary | ICD-10-CM | POA: Diagnosis not present

## 2019-12-28 DIAGNOSIS — N184 Chronic kidney disease, stage 4 (severe): Secondary | ICD-10-CM | POA: Diagnosis not present

## 2019-12-28 DIAGNOSIS — M6281 Muscle weakness (generalized): Secondary | ICD-10-CM | POA: Diagnosis not present

## 2019-12-28 DIAGNOSIS — I130 Hypertensive heart and chronic kidney disease with heart failure and stage 1 through stage 4 chronic kidney disease, or unspecified chronic kidney disease: Secondary | ICD-10-CM | POA: Diagnosis present

## 2019-12-28 DIAGNOSIS — I639 Cerebral infarction, unspecified: Secondary | ICD-10-CM | POA: Diagnosis present

## 2019-12-28 DIAGNOSIS — Z1612 Extended spectrum beta lactamase (ESBL) resistance: Secondary | ICD-10-CM | POA: Diagnosis present

## 2019-12-28 DIAGNOSIS — G9340 Encephalopathy, unspecified: Secondary | ICD-10-CM | POA: Diagnosis present

## 2019-12-28 DIAGNOSIS — Z87891 Personal history of nicotine dependence: Secondary | ICD-10-CM

## 2019-12-28 DIAGNOSIS — E1122 Type 2 diabetes mellitus with diabetic chronic kidney disease: Secondary | ICD-10-CM | POA: Diagnosis present

## 2019-12-28 DIAGNOSIS — I10 Essential (primary) hypertension: Secondary | ICD-10-CM | POA: Diagnosis not present

## 2019-12-28 DIAGNOSIS — K5909 Other constipation: Secondary | ICD-10-CM | POA: Diagnosis present

## 2019-12-28 DIAGNOSIS — R8271 Bacteriuria: Secondary | ICD-10-CM | POA: Diagnosis present

## 2019-12-28 DIAGNOSIS — R9082 White matter disease, unspecified: Secondary | ICD-10-CM | POA: Diagnosis not present

## 2019-12-28 DIAGNOSIS — Z6836 Body mass index (BMI) 36.0-36.9, adult: Secondary | ICD-10-CM | POA: Diagnosis not present

## 2019-12-28 DIAGNOSIS — E875 Hyperkalemia: Secondary | ICD-10-CM | POA: Diagnosis present

## 2019-12-28 DIAGNOSIS — Z79899 Other long term (current) drug therapy: Secondary | ICD-10-CM

## 2019-12-28 DIAGNOSIS — R0902 Hypoxemia: Secondary | ICD-10-CM | POA: Diagnosis not present

## 2019-12-28 DIAGNOSIS — Z7951 Long term (current) use of inhaled steroids: Secondary | ICD-10-CM

## 2019-12-28 DIAGNOSIS — H748X1 Other specified disorders of right middle ear and mastoid: Secondary | ICD-10-CM | POA: Diagnosis not present

## 2019-12-28 DIAGNOSIS — Z96651 Presence of right artificial knee joint: Secondary | ICD-10-CM | POA: Diagnosis present

## 2019-12-28 DIAGNOSIS — Z7984 Long term (current) use of oral hypoglycemic drugs: Secondary | ICD-10-CM

## 2019-12-28 LAB — COMPREHENSIVE METABOLIC PANEL
ALT: 14 U/L (ref 0–44)
AST: 24 U/L (ref 15–41)
Albumin: 2.9 g/dL — ABNORMAL LOW (ref 3.5–5.0)
Alkaline Phosphatase: 111 U/L (ref 38–126)
Anion gap: 7 (ref 5–15)
BUN: 31 mg/dL — ABNORMAL HIGH (ref 8–23)
CO2: 29 mmol/L (ref 22–32)
Calcium: 9.8 mg/dL (ref 8.9–10.3)
Chloride: 102 mmol/L (ref 98–111)
Creatinine, Ser: 1.94 mg/dL — ABNORMAL HIGH (ref 0.44–1.00)
GFR, Estimated: 26 mL/min — ABNORMAL LOW (ref >60)
Glucose, Bld: 84 mg/dL (ref 70–99)
Potassium: 5.2 mmol/L — ABNORMAL HIGH (ref 3.5–5.1)
Sodium: 138 mmol/L (ref 135–145)
Total Bilirubin: 0.5 mg/dL (ref 0.3–1.2)
Total Protein: 7.9 g/dL (ref 6.5–8.1)

## 2019-12-28 LAB — BLOOD GAS, ARTERIAL
Acid-Base Excess: 1.8 mmol/L (ref 0.0–2.0)
Bicarbonate: 27.7 mmol/L (ref 20.0–28.0)
FIO2: 0.28
O2 Saturation: 91.4 %
Patient temperature: 37
pCO2 arterial: 48 mmHg (ref 32.0–48.0)
pH, Arterial: 7.37 (ref 7.350–7.450)
pO2, Arterial: 64 mmHg — ABNORMAL LOW (ref 83.0–108.0)

## 2019-12-28 LAB — URINALYSIS, COMPLETE (UACMP) WITH MICROSCOPIC
Bilirubin Urine: NEGATIVE
Glucose, UA: NEGATIVE mg/dL
Ketones, ur: NEGATIVE mg/dL
Nitrite: NEGATIVE
Protein, ur: NEGATIVE mg/dL
Specific Gravity, Urine: 1.01 (ref 1.005–1.030)
pH: 5 (ref 5.0–8.0)

## 2019-12-28 LAB — CBC WITH DIFFERENTIAL/PLATELET
Abs Immature Granulocytes: 0.02 10*3/uL (ref 0.00–0.07)
Basophils Absolute: 0 10*3/uL (ref 0.0–0.1)
Basophils Relative: 0 %
Eosinophils Absolute: 0.3 10*3/uL (ref 0.0–0.5)
Eosinophils Relative: 5 %
HCT: 32.9 % — ABNORMAL LOW (ref 36.0–46.0)
Hemoglobin: 10.4 g/dL — ABNORMAL LOW (ref 12.0–15.0)
Immature Granulocytes: 0 %
Lymphocytes Relative: 34 %
Lymphs Abs: 1.6 10*3/uL (ref 0.7–4.0)
MCH: 30.1 pg (ref 26.0–34.0)
MCHC: 31.6 g/dL (ref 30.0–36.0)
MCV: 95.4 fL (ref 80.0–100.0)
Monocytes Absolute: 0.3 10*3/uL (ref 0.1–1.0)
Monocytes Relative: 7 %
Neutro Abs: 2.5 10*3/uL (ref 1.7–7.7)
Neutrophils Relative %: 54 %
Platelets: 175 10*3/uL (ref 150–400)
RBC: 3.45 MIL/uL — ABNORMAL LOW (ref 3.87–5.11)
RDW: 14.6 % (ref 11.5–15.5)
WBC: 4.8 10*3/uL (ref 4.0–10.5)
nRBC: 0 % (ref 0.0–0.2)

## 2019-12-28 LAB — BLOOD GAS, VENOUS
Acid-Base Excess: 3.3 mmol/L — ABNORMAL HIGH (ref 0.0–2.0)
Bicarbonate: 32.3 mmol/L — ABNORMAL HIGH (ref 20.0–28.0)
O2 Saturation: 64.3 %
Patient temperature: 37
pCO2, Ven: 72 mmHg (ref 44.0–60.0)
pH, Ven: 7.26 (ref 7.250–7.430)
pO2, Ven: 39 mmHg (ref 32.0–45.0)

## 2019-12-28 LAB — PROTIME-INR
INR: 1 (ref 0.8–1.2)
Prothrombin Time: 12.3 seconds (ref 11.4–15.2)

## 2019-12-28 LAB — APTT: aPTT: 29 seconds (ref 24–36)

## 2019-12-28 LAB — RESPIRATORY PANEL BY RT PCR (FLU A&B, COVID)
Influenza A by PCR: NEGATIVE
Influenza B by PCR: NEGATIVE
SARS Coronavirus 2 by RT PCR: NEGATIVE

## 2019-12-28 LAB — GLUCOSE, CAPILLARY: Glucose-Capillary: 156 mg/dL — ABNORMAL HIGH (ref 70–99)

## 2019-12-28 LAB — LACTIC ACID, PLASMA: Lactic Acid, Venous: 1.2 mmol/L (ref 0.5–1.9)

## 2019-12-28 MED ORDER — UMECLIDINIUM BROMIDE 62.5 MCG/INH IN AEPB
1.0000 | INHALATION_SPRAY | Freq: Every day | RESPIRATORY_TRACT | Status: DC
  Administered 2019-12-29 – 2019-12-30 (×2): 1 via RESPIRATORY_TRACT
  Filled 2019-12-28: qty 7

## 2019-12-28 MED ORDER — INSULIN ASPART 100 UNIT/ML ~~LOC~~ SOLN
0.0000 [IU] | Freq: Three times a day (TID) | SUBCUTANEOUS | Status: DC
  Administered 2019-12-29: 1 [IU] via SUBCUTANEOUS
  Administered 2019-12-29: 4 [IU] via SUBCUTANEOUS
  Filled 2019-12-28 (×2): qty 1

## 2019-12-28 MED ORDER — DOCUSATE SODIUM 100 MG PO CAPS
100.0000 mg | ORAL_CAPSULE | Freq: Two times a day (BID) | ORAL | Status: DC
  Administered 2019-12-28: 100 mg via ORAL
  Filled 2019-12-28: qty 1

## 2019-12-28 MED ORDER — FLUTICASONE-UMECLIDIN-VILANT 100-62.5-25 MCG/INH IN AEPB: 1.0000 | INHALATION_SPRAY | Freq: Every day | RESPIRATORY_TRACT | Status: DC

## 2019-12-28 MED ORDER — MUPIROCIN 2 % EX OINT
1.0000 "application " | TOPICAL_OINTMENT | Freq: Two times a day (BID) | CUTANEOUS | Status: DC
  Administered 2019-12-29 – 2019-12-30 (×3): 1 via NASAL
  Filled 2019-12-28 (×2): qty 22

## 2019-12-28 MED ORDER — SODIUM CHLORIDE 0.9 % IV SOLN
250.0000 mL | INTRAVENOUS | Status: DC | PRN
  Administered 2019-12-28: 250 mL via INTRAVENOUS

## 2019-12-28 MED ORDER — ASCORBIC ACID 500 MG PO TABS
500.0000 mg | ORAL_TABLET | Freq: Every day | ORAL | Status: DC
  Administered 2019-12-28 – 2019-12-30 (×3): 500 mg via ORAL
  Filled 2019-12-28 (×3): qty 1

## 2019-12-28 MED ORDER — SODIUM CHLORIDE 0.9% FLUSH
3.0000 mL | Freq: Two times a day (BID) | INTRAVENOUS | Status: DC
  Administered 2019-12-28 – 2019-12-30 (×5): 3 mL via INTRAVENOUS

## 2019-12-28 MED ORDER — LEVETIRACETAM 500 MG PO TABS
1500.0000 mg | ORAL_TABLET | Freq: Every day | ORAL | Status: DC
  Administered 2019-12-28 – 2019-12-29 (×2): 1500 mg via ORAL
  Filled 2019-12-28 (×2): qty 3

## 2019-12-28 MED ORDER — FLUTICASONE FUROATE-VILANTEROL 100-25 MCG/INH IN AEPB
1.0000 | INHALATION_SPRAY | Freq: Every day | RESPIRATORY_TRACT | Status: DC
  Administered 2019-12-28: 1 via RESPIRATORY_TRACT
  Filled 2019-12-28 (×2): qty 28

## 2019-12-28 MED ORDER — LORAZEPAM 1 MG PO TABS: 1.0000 mg | ORAL_TABLET | Freq: Once | ORAL | Status: DC

## 2019-12-28 MED ORDER — FERROUS SULFATE 325 (65 FE) MG PO TABS
325.0000 mg | ORAL_TABLET | Freq: Two times a day (BID) | ORAL | Status: DC
  Administered 2019-12-28 – 2019-12-30 (×5): 325 mg via ORAL
  Filled 2019-12-28 (×6): qty 1

## 2019-12-28 MED ORDER — PANTOPRAZOLE SODIUM 40 MG PO TBEC
40.0000 mg | DELAYED_RELEASE_TABLET | Freq: Every day | ORAL | Status: DC
  Administered 2019-12-28 – 2019-12-30 (×3): 40 mg via ORAL
  Filled 2019-12-28 (×3): qty 1

## 2019-12-28 MED ORDER — SODIUM CHLORIDE 0.9% FLUSH: 3.0000 mL | INTRAVENOUS | Status: DC | PRN

## 2019-12-28 MED ORDER — GABAPENTIN 300 MG PO CAPS
300.0000 mg | ORAL_CAPSULE | Freq: Every day | ORAL | Status: DC
  Administered 2019-12-28 – 2019-12-29 (×2): 300 mg via ORAL
  Filled 2019-12-28 (×2): qty 1

## 2019-12-28 MED ORDER — IPRATROPIUM-ALBUTEROL 0.5-2.5 (3) MG/3ML IN SOLN
3.0000 mL | Freq: Three times a day (TID) | RESPIRATORY_TRACT | Status: DC
  Administered 2019-12-28 – 2019-12-30 (×6): 3 mL via RESPIRATORY_TRACT
  Filled 2019-12-28 (×6): qty 3

## 2019-12-28 MED ORDER — ENOXAPARIN SODIUM 40 MG/0.4ML ~~LOC~~ SOLN
40.0000 mg | SUBCUTANEOUS | Status: DC
  Administered 2019-12-28: 40 mg via SUBCUTANEOUS
  Filled 2019-12-28: qty 0.4

## 2019-12-28 MED ORDER — SODIUM CHLORIDE 0.9 % IV BOLUS
500.0000 mL | Freq: Once | INTRAVENOUS | Status: AC
  Administered 2019-12-28: 500 mL via INTRAVENOUS

## 2019-12-28 MED ORDER — SACCHAROMYCES BOULARDII 250 MG PO CAPS
250.0000 mg | ORAL_CAPSULE | Freq: Two times a day (BID) | ORAL | Status: DC
  Administered 2019-12-28 – 2019-12-30 (×5): 250 mg via ORAL
  Filled 2019-12-28 (×7): qty 1

## 2019-12-28 MED ORDER — LORATADINE 10 MG PO TABS
10.0000 mg | ORAL_TABLET | Freq: Every day | ORAL | Status: DC
  Administered 2019-12-28 – 2019-12-30 (×3): 10 mg via ORAL
  Filled 2019-12-28 (×3): qty 1

## 2019-12-28 MED ORDER — SODIUM CHLORIDE 0.9 % IV SOLN
1.0000 g | Freq: Two times a day (BID) | INTRAVENOUS | Status: DC
  Administered 2019-12-28 – 2019-12-29 (×2): 1 g via INTRAVENOUS
  Filled 2019-12-28 (×4): qty 1

## 2019-12-28 MED ORDER — ZINC SULFATE 220 (50 ZN) MG PO CAPS
220.0000 mg | ORAL_CAPSULE | Freq: Every day | ORAL | Status: DC
  Administered 2019-12-28 – 2019-12-30 (×3): 220 mg via ORAL
  Filled 2019-12-28 (×3): qty 1

## 2019-12-28 MED ORDER — IPRATROPIUM-ALBUTEROL 0.5-2.5 (3) MG/3ML IN SOLN
3.0000 mL | Freq: Once | RESPIRATORY_TRACT | Status: AC
  Administered 2019-12-28: 3 mL via RESPIRATORY_TRACT
  Filled 2019-12-28: qty 3

## 2019-12-28 MED ORDER — LACOSAMIDE 50 MG PO TABS
100.0000 mg | ORAL_TABLET | Freq: Two times a day (BID) | ORAL | Status: DC
  Administered 2019-12-28 – 2019-12-30 (×5): 100 mg via ORAL
  Filled 2019-12-28 (×5): qty 2

## 2019-12-28 MED ORDER — ACETAMINOPHEN 325 MG PO TABS
650.0000 mg | ORAL_TABLET | Freq: Three times a day (TID) | ORAL | Status: DC
  Administered 2019-12-28 – 2019-12-30 (×6): 650 mg via ORAL
  Filled 2019-12-28 (×6): qty 2

## 2019-12-28 MED ORDER — ATORVASTATIN CALCIUM 80 MG PO TABS
80.0000 mg | ORAL_TABLET | Freq: Every evening | ORAL | Status: DC
  Administered 2019-12-28 – 2019-12-29 (×2): 80 mg via ORAL
  Filled 2019-12-28 (×2): qty 1

## 2019-12-28 MED ORDER — METHYLPREDNISOLONE SODIUM SUCC 125 MG IJ SOLR
80.0000 mg | Freq: Once | INTRAMUSCULAR | Status: AC
  Administered 2019-12-28: 80 mg via INTRAVENOUS
  Filled 2019-12-28: qty 2

## 2019-12-28 MED ORDER — ACETAMINOPHEN 325 MG PO TABS
650.0000 mg | ORAL_TABLET | Freq: Once | ORAL | Status: AC
  Administered 2019-12-28: 650 mg via ORAL
  Filled 2019-12-28: qty 2

## 2019-12-28 MED ORDER — NITROGLYCERIN 0.4 MG SL SUBL: 0.4000 mg | SUBLINGUAL_TABLET | SUBLINGUAL | Status: DC | PRN

## 2019-12-28 NOTE — H&P (Signed)
History and Physical    Katherine [name] EPP:[id number] DOB: January 07, 1941 DOA: 12/28/2019  PCP: [facility], [place]   Patient coming from: [place]  I have personally briefly reviewed patient's old medical records in [place]  Chief Complaint: Change in mental status  HPI: Katherine [name] is a 79 y.o. 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  At baseline patient is usually awake, alert and oriented to person place and time but per nursing home she was not at her baseline.  Patient with complaints of severe frontal headache. During my assessment patient is lethargic but arouses to verbal stimuli and is oriented to person and place. She was treated for urinary tract infection and she complains of a headache but she denies having any chest pain, no shortness of breath, no fever, no chills, no cough, no abdominal pain or any changes in her bowel habits. Venous blood gas shows 7.26/72/39/32/64.3 Respiratory viral panel is negative Patient has significant pyuria Sodium 138, potassium 5.2, chloride 102, bicarb 29, glucose 84, BUN 31, creatinine 1.94, calcium 9.8, alkaline phosphatase 111, albumin 2.9, AST 24, ALT 14, total protein 7.9, lactic acid 1.2, white count 4.8, hemoglobin 10.4, hematocrit 32.9, MCV 95.4, RDW 14.6, platelet count 175, PT 12.3, INR 1.0 CT scan of the head without contrast shows no acute intracranial abnormality.  Atrophy with chronic small vessel white matter ischemic disease.  Right mastoid effusion, stable. Chest x-ray reviewed by me shows streaky opacities in bilateral lower lobes which may represent scarring versus atelectasis. Twelve-lead EKG reviewed by me shows sinus rhythm with a  left anterior fascicular block    ED Course: Patient is a 79 year old female who resides in a skilled nursing facility and who was sent to the ER for evaluation of mental status changes.  She is noted to have hypercapnic respiratory failure and she has pyuria.  She will be admitted to the hospital for further evaluation.  Review of Systems: As per HPI otherwise 10 point review of systems negative.   Past Medical History:  Diagnosis Date  . Angioedema   . Anxiety and depression   . CHF (congestive heart failure) ([name])   . Chronic constipation   . Chronic kidney disease    stage 3  . COPD (chronic obstructive pulmonary disease) ([place])   . Diabetes mellitus without complication ([place])   . Gallstones   . GERD (gastroesophageal reflux disease)   . Hypertension   . Left ventricular hypertrophy   . Osteoarthritis   . Prurigo nodularis   . Seizures ([place])   . Stroke ([place])   . Tobacco abuse   . Vitamin D deficiency disease     Past Surgical History:  Procedure Laterality Date  . CHOLECYSTECTOMY    . 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   Procedure: TOTAL KNEE ARTHROPLASTY;  Surgeon: Hessie Knows, MD;  Location: [facility] ORS;  Service: Orthopedics;  Laterality: Right;     reports that she has quit smoking. Her smoking use included cigarettes. She has a 30.00 pack-year smoking history. She has never used smokeless tobacco. She reports that she does not drink alcohol and does not use drugs.  Allergies  Allergen Reactions  . Bee Venom Swelling  . Enalapril Maleate Swelling  Family History  Problem Relation Age of Onset  . Alcohol abuse Mother   . Sickle cell anemia Daughter   . Hypertension Son   . Cancer Neg Hx   . COPD Neg Hx   . Diabetes Neg Hx   . Heart disease Neg Hx   . Stroke Neg Hx      Prior to Admission medications   Medication Sig Start Date End Date Taking? Authorizing Provider  acetaminophen (TYLENOL) 650 MG CR tablet  Take 650 mg by mouth 3 (three) times daily.    Yes [provider]  albuterol (ACCUNEB) 0.63 MG/3ML nebulizer solution Take 3 mLs by nebulization every 8 (eight) hours as needed for wheezing.    Yes [provider]  atorvastatin (LIPITOR) 80 MG tablet Take 80 mg by mouth every evening.    Yes [provider]  cetirizine (ZYRTEC) 10 MG tablet Take 10 mg by mouth daily.    Yes [provider]  docusate sodium (COLACE) 100 MG capsule Take 100 mg by mouth 2 (two) times daily.   Yes [provider]  ferrous sulfate 325 (65 FE) MG tablet Take 325 mg by mouth 2 (two) times daily.   Yes [provider]  Fluticasone-Umeclidin-Vilant 100-62.5-25 MCG/INH AEPB Inhale 1 puff into the lungs daily.   Yes [provider]  gabapentin (NEURONTIN) 300 MG capsule Take 300 mg by mouth at bedtime.   Yes [provider]  ipratropium-albuterol (DUONEB) 0.5-2.5 (3) MG/3ML SOLN Take 3 mLs by nebulization 3 (three) times daily. 07/17/18  Yes Gladstone Lighter, MD  lacosamide 100 MG TABS Take 1 tablet (100 mg total) by mouth 2 (two) times daily. 12/31/18  Yes Thurnell Lose, MD  levETIRAcetam (KEPPRA) 750 MG tablet Take 1,500 mg by mouth daily.    Yes [provider]  linagliptin (TRADJENTA) 5 MG TABS tablet Take 5 mg by mouth daily.   Yes [provider]  LORazepam (ATIVAN) 2 MG/ML injection Inject 0.5 mg into the vein daily as needed for seizure.   Yes [provider]  Menthol, Topical Analgesic, (BIOFREEZE) 4 % GEL Apply 1 application topically in the morning and at bedtime. To both knees    Yes [provider]  mupirocin ointment (BACTROBAN) 2 % Place 1 application into the nose 2 (two) times daily. 06/13/18  Yes Wieting, Richard, MD  nitroGLYCERIN (NITROSTAT) 0.4 MG SL tablet Place 0.4 mg under the tongue every 5 (five) minutes as needed for chest pain.  08/07/18  Yes [provider]  pantoprazole (PROTONIX)  40 MG tablet Take 1 tablet (40 mg total) by mouth daily. 04/29/18  Yes Bettey Costa, MD  Saccharomyces boulardii (PROBIOTIC) 250 MG CAPS Take 1 capsule by mouth 2 times daily at 12 noon and 4 pm. Patient taking differently: Take 1 capsule by mouth 2 (two) times daily.  04/05/19  Yes Merlyn Lot, MD  Saline GEL Place 3 sprays into both nostrils 3 (three) times daily.    Yes [provider]  sulfamethoxazole-trimethoprim (BACTRIM DS) 800-160 MG tablet Take 1 tablet by mouth 2 (two) times daily. 12/23/19 12/30/19 Yes [provider]  vitamin C (VITAMIN C) 500 MG tablet Take 1 tablet (500 mg total) by mouth daily. 01/12/19  Yes Allie Bossier, MD  zinc sulfate 220 (50 Zn) MG capsule Take 1 capsule (220 mg total) by mouth daily. 01/12/19  Yes Allie Bossier, MD    Physical Exam: Vitals:   12/28/19 1034 12/28/19 1100 12/28/19 1130  12/28/19 1200  BP: 124/78 134/71 (!) 149/81 127/73  Pulse: 72 71 80 77  Resp: 19 18 14 19   Temp:      TempSrc:      SpO2: 100% 100% 100% 100%  Weight:      Height:         Vitals:   12/28/19 1034 12/28/19 1100 12/28/19 1130 12/28/19 1200  BP: 124/78 134/71 (!) 149/81 127/73  Pulse: 72 71 80 77  Resp: 19 18 14 19   Temp:      TempSrc:      SpO2: 100% 100% 100% 100%  Weight:      Height:        Constitutional: NAD, lethargic but arousable and oriented x 2.  Person and place Eyes: PERRL, lids and conjunctivae pallor ENMT: Mucous membranes are moist.  Neck: normal, supple, no masses, no thyromegaly Respiratory: Bilateral air entry,, no crackles. Normal respiratory effort. No accessory muscle use.  Cardiovascular: Regular rate and rhythm, no murmurs / rubs / gallops.  Trace extremity edema. 2+ pedal pulses. No carotid bruits.  Abdomen: no tenderness, no masses palpated. No hepatosplenomegaly. Bowel sounds positive.  Musculoskeletal: no clubbing / cyanosis. No joint deformity upper and lower extremities.  Skin: no rashes, lesions, ulcers.   Neurologic: No gross focal neurologic deficit.  Generalized weakness Psychiatric: Normal mood and affect.   Labs on Admission: I have personally reviewed following labs and imaging studies  CBC: Recent Labs  Lab 12/28/19 0902  WBC 4.8  NEUTROABS 2.5  HGB 10.4*  HCT 32.9*  MCV 95.4  PLT 858   Basic Metabolic Panel: Recent Labs  Lab 12/28/19 0902  NA 138  K 5.2*  CL 102  CO2 29  GLUCOSE 84  BUN 31*  CREATININE 1.94*  CALCIUM 9.8   GFR: Estimated Creatinine Clearance: 28.8 mL/min (A) (by C-G formula based on SCr of 1.94 mg/dL (H)). Liver Function Tests: Recent Labs  Lab 12/28/19 0902  AST 24  ALT 14  ALKPHOS 111  BILITOT 0.5  PROT 7.9  ALBUMIN 2.9*   No results for input(s): LIPASE, AMYLASE in the last 168 hours. No results for input(s): AMMONIA in the last 168 hours. Coagulation Profile: Recent Labs  Lab 12/28/19 0922  INR 1.0   Cardiac Enzymes: No results for input(s): CKTOTAL, CKMB, CKMBINDEX, TROPONINI in the last 168 hours. BNP (last 3 results) No results for input(s): PROBNP in the last 8760 hours. HbA1C: No results for input(s): HGBA1C in the last 72 hours. CBG: No results for input(s): GLUCAP in the last 168 hours. Lipid Profile: No results for input(s): CHOL, HDL, LDLCALC, TRIG, CHOLHDL, LDLDIRECT in the last 72 hours. Thyroid Function Tests: No results for input(s): TSH, T4TOTAL, FREET4, T3FREE, THYROIDAB in the last 72 hours. Anemia Panel: No results for input(s): VITAMINB12, FOLATE, FERRITIN, TIBC, IRON, RETICCTPCT in the last 72 hours. Urine analysis:    Component Value Date/Time   COLORURINE YELLOW (A) 12/28/2019 1122   APPEARANCEUR HAZY (A) 12/28/2019 1122   APPEARANCEUR Clear 02/20/2013 1459   LABSPEC 1.010 12/28/2019 1122   LABSPEC 1.012 02/20/2013 1459   PHURINE 5.0 12/28/2019 1122   GLUCOSEU NEGATIVE 12/28/2019 1122   GLUCOSEU Negative 02/20/2013 1459   HGBUR SMALL (A) 12/28/2019 1122   BILIRUBINUR NEGATIVE 12/28/2019 1122    BILIRUBINUR Negative 02/20/2013 [place] 12/28/2019 1122   PROTEINUR NEGATIVE 12/28/2019 1122   NITRITE NEGATIVE 12/28/2019 1122   LEUKOCYTESUR LARGE (A) 12/28/2019 1122   LEUKOCYTESUR Negative 02/20/2013 1459  Radiological Exams on Admission: CT Head Wo Contrast  Result Date: 12/28/2019 CLINICAL DATA:  Mental status change. EXAM: CT HEAD WITHOUT CONTRAST TECHNIQUE: Contiguous axial images were obtained from the base of the skull through the vertex without intravenous contrast. COMPARISON:  03/02/2019 FINDINGS: Brain: There is no evidence for acute hemorrhage, hydrocephalus, mass lesion, or abnormal extra-axial fluid collection. No definite CT evidence for acute infarction. Diffuse loss of parenchymal volume is consistent with atrophy. Patchy low attenuation in the deep hemispheric and periventricular white matter is nonspecific, but likely reflects chronic microvascular ischemic demyelination. Vascular: No hyperdense vessel or unexpected calcification. Skull: No evidence for fracture. No worrisome lytic or sclerotic lesion. Sinuses/Orbits: Right mastoid effusion again noted. Visualized paranasal sinuses are clear. Visualized portions of the globes and intraorbital fat are unremarkable. Other: None. IMPRESSION: 1. No acute intracranial abnormality. 2. Atrophy with chronic small vessel white matter ischemic disease. 3. Right mastoid effusion, stable. Electronically Signed   By: Misty Stanley M.D.   On: 12/28/2019 10:47   DG Chest Portable 1 View  Result Date: 12/28/2019 CLINICAL DATA:  Altered mental status. EXAM: PORTABLE CHEST 1 VIEW COMPARISON:  September 25, 2019 FINDINGS: Cardiomediastinal silhouette is normal. Mediastinal contours appear intact. Calcific atherosclerotic disease and tortuosity of the aorta. There is no evidence of focal airspace consolidation, pleural effusion or pneumothorax. Streaky opacities in bilateral lower lobes may represent scarring versus  atelectasis. Osseous structures are without acute abnormality. Soft tissues are grossly normal. IMPRESSION: Streaky opacities in bilateral lower lobes may represent scarring versus atelectasis. No acute findings. Electronically Signed   By: Fidela Salisbury M.D.   On: 12/28/2019 09:37    EKG: Independently reviewed.  Sinus rhythm with left anterior fascicular block  Assessment/Plan Principal Problem:   Respiratory failure, acute-on-chronic (HCC) Active Problems:   Morbid obesity (HCC)   Chronic diastolic heart failure (HCC)   COPD with acute exacerbation (HCC)   Acute encephalopathy   Stroke ([place])   Type 2 diabetes mellitus with stage 4 chronic kidney disease (HCC)     Acute on chronic respiratory failure  With uncompensated respiratory acidosis Patient noted to have a pH of 7.26, PCO2 of 72 but with normal bicarbonate level Hypercapnia may be related to excessive oxygen administration Patient has a history of chronic respiratory failure and is on 4 L of oxygen via nasal cannula and maintaining pulse oximetry of 100%, she has been weaned down to 2 L per nasal cannula with pulse oximetry in the upper 90s We will hold off on BiPAP for now since patient is easily arousable We will repeat arterial blood gas and make changes depending on the results    COPD with acute exacerbation Continue scheduled and as needed bronchodilator therapy Continue inhaled steroids    Seizure disorder Continue Keppra and lacosamide Place patient on seizure precautions   UTI Patient has a history of ESBL E. coli UTI We will treat patient empirically with meropenem until urine culture results become available   Morbid obesity Complicates overall prognosis and care    Diabetes mellitus with complications of stage IV chronic kidney disease Maintain consistent carbohydrate Glycemic control with sliding scale insulin     DVT prophylaxis: Lovenox Code Status: Full code Family  Communication: Greater than 50% of time was spent discussing patient's condition and plan of care with her daughter over the phone.  All questions and concerns have been addressed.  They verbalized understanding and agreed with the plan. Disposition Plan: Back to previous home environment Consults called:  None    Karnell Vanderloop MD [facility]     12/28/2019, 1:04 PM

## 2019-12-28 NOTE — ED Triage Notes (Addendum)
Pt arrives via EMS from [facility] after they noticed her having AMS- pt is normally A&O x4 per facility- pt was able to answer some triage questions appropriately but not others- pt able to follow commands- pt has a hx of seizures and is currently being treated for a UTI- pt states she has a headache but EMS states their stroke screen was negative- pt on chronic 4L [place]

## 2019-12-28 NOTE — ED Provider Notes (Addendum)
[facility] Emergency Department Provider Note    First MD Initiated Contact with Patient 12/28/19 [phone]     (approximate)  I have reviewed the triage vital signs and the nursing notes.   HISTORY  Chief Complaint Altered Mental Status    HPI Katherine [name] is a 79 y.o. female extensive past medical history as listed below presents to the ER for altered mental status.  States that she is hurting all over but is very poor historian.  Last known well was last night at 7 PM.  Has a history of seizures.  Witnessed seizure-like activity.  Also has a history of COPD CHF on chronic home oxygen.  States that she typically takes Tylenol for pain and states that she hurts all over.  Reportedly is on antibiotic for UTI.    Past Medical History:  Diagnosis Date  . Angioedema   . Anxiety and depression   . CHF (congestive heart failure) ([place])   . Chronic constipation   . Chronic kidney disease    stage 3  . COPD (chronic obstructive pulmonary disease) ([place])   . Diabetes mellitus without complication ([place])   . Gallstones   . GERD (gastroesophageal reflux disease)   . Hypertension   . Left ventricular hypertrophy   . Osteoarthritis   . Prurigo nodularis   . Seizures ([place])   . Stroke ([place])   . Tobacco abuse   . Vitamin D deficiency disease    Family History  Problem Relation Age of Onset  . Alcohol abuse Mother   . Sickle cell anemia Daughter   . Hypertension Son   . Cancer Neg Hx   . COPD Neg Hx   . Diabetes Neg Hx   . Heart disease Neg Hx   . Stroke Neg Hx    Past Surgical History:  Procedure Laterality Date  . CHOLECYSTECTOMY    . 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   Procedure: TOTAL KNEE ARTHROPLASTY;  Surgeon: Hessie Knows, MD;  Location: [facility] ORS;  Service: Orthopedics;  Laterality: Right;   Patient Active Problem List   Diagnosis Date Noted  . Respiratory failure, acute-on-chronic ([place]) 12/28/2019   . Type 2 diabetes mellitus with stage 4 chronic kidney disease ([place]) 12/28/2019  . Sepsis due to gram-negative UTI ([place]) 06/19/2019  . Type II diabetes mellitus with renal manifestations ([place]) 06/19/2019  . Stroke ([place]) 06/19/2019  . AAA (abdominal aortic aneurysm) ([place]) 06/19/2019  . Acute metabolic encephalopathy 21/30/8657  . Iron deficiency anemia   . Altered mental status   . Acute encephalopathy 02/11/2019  . Sepsis secondary to UTI ([place]) 01/08/2019  . Acute renal failure superimposed on stage 3b chronic kidney disease ([place]) 01/08/2019  . Seizure disorder ([place]) 01/08/2019  . Diabetes mellitus type 2, controlled, with complications ([place]) 84/69/6295  . Acute on chronic anemia 01/03/2019  . COVID-19 12/21/2018  . COVID-19 virus infection 12/21/2018  . Hemoptysis 12/20/2018  . Pulmonary embolism ([place]) 11/21/2018  . Delirium   . UTI (urinary tract infection) 08/21/2018  . Chest pain 07/15/2018  . Palliative care encounter 05/10/2018  . Localized edema 05/10/2018  . Shortness of breath 05/10/2018  . Hematemesis 04/28/2018  . Influenza A 04/13/2018  . Acute on chronic congestive heart failure ([place])   . COPD with acute exacerbation ([place]) 02/24/2018  . Chronic diastolic heart failure ([place]) 12/31/2017  . Lymphedema 12/31/2017  . COPD exacerbation ([place]) 11/30/2017  . Urinary tract infection 11/22/2017  .  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[place]) 11/16/2017  . Diabetes mellitus type 2, uncomplicated ([place]) 16/08/3708  . Pressure injury of skin 10/29/2017  . Acute kidney injury superimposed on CKD ([place]) 10/24/2017  . Sepsis ([place]) 10/24/2017  . Possible Seizures ([place]) 10/08/2017  . Hyperlipemia 10/08/2017  . 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 Stroke-like episode ([place]) s/p IV tpa 10/04/2017  . Palliative care by specialist   . Elevated rheumatoid factor 09/05/2017  . Frequent hospital admissions 08/22/2017  . Aphasia 06/28/2017  . Right hand pain  03/21/2017  . Dry skin 03/21/2017  . Pain in finger of left hand 09/12/2016  . Chronic fatigue 06/12/2016  . Left knee pain 06/12/2016  . Goals of care, counseling/discussion 03/13/2016  . Primary localized osteoarthritis of right knee 02/08/2016  . OSA (obstructive sleep apnea) 09/16/2015  . Rotator cuff syndrome 09/07/2015  . 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 Sleep disturbance 04/14/2015  . Coronary artery disease 03/14/2015  . 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 Acute respiratory failure with hypoxia ([place]) 09/16/2014  . 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 Anxiety   . Tobacco abuse   . Prurigo nodularis   . GERD (gastroesophageal reflux disease)   . COPD (chronic obstructive pulmonary disease) ([place])   . Osteoarthritis   . Vitamin D deficiency disease   . Chronic constipation   . CKD (chronic kidney disease), stage III ([place])   . Essential hypertension 05/20/2013  . Morbid obesity ([place]) 05/20/2013      Prior to Admission medications   Medication Sig Start Date End Date Taking? Authorizing Provider  acetaminophen (TYLENOL) 650 MG CR tablet Take 650 mg by mouth 3 (three) times daily.    Yes [provider]  albuterol (ACCUNEB) 0.63 MG/3ML nebulizer solution Take 3 mLs by nebulization every 8 (eight) hours as needed for wheezing.    Yes [provider]  atorvastatin (LIPITOR) 80 MG tablet Take 80 mg by mouth every evening.    Yes [provider]  cetirizine (ZYRTEC) 10 MG tablet Take 10 mg by mouth daily.    Yes [provider]  docusate sodium (COLACE) 100 MG capsule Take 100 mg by mouth 2 (two) times daily.   Yes [provider]  ferrous sulfate 325 (65 FE) MG tablet Take 325 mg by mouth 2 (two) times daily.   Yes [provider]  Fluticasone-Umeclidin-Vilant 100-62.5-25 MCG/INH AEPB Inhale 1 puff into the lungs daily.   Yes [provider]  gabapentin (NEURONTIN) 300 MG capsule Take 300 mg by mouth at bedtime.   Yes  [provider]  ipratropium-albuterol (DUONEB) 0.5-2.5 (3) MG/3ML SOLN Take 3 mLs by nebulization 3 (three) times daily. 07/17/18  Yes Gladstone Lighter, MD  lacosamide 100 MG TABS Take 1 tablet (100 mg total) by mouth 2 (two) times daily. 12/31/18  Yes Thurnell Lose, MD  levETIRAcetam (KEPPRA) 750 MG tablet Take 1,500 mg by mouth daily.    Yes [provider]  linagliptin (TRADJENTA) 5 MG TABS tablet Take 5 mg by mouth daily.   Yes [provider]  LORazepam (ATIVAN) 2 MG/ML injection Inject 0.5 mg into the vein daily as needed for seizure.   Yes [provider]  Menthol, Topical Analgesic, (BIOFREEZE) 4 % GEL Apply 1 application topically in the morning and at bedtime. To both knees    Yes [provider]  mupirocin ointment (BACTROBAN) 2 % Place 1 application into the nose 2 (two) times daily. 06/13/18  Yes  Loletha Grayer, MD  nitroGLYCERIN (NITROSTAT) 0.4 MG SL tablet Place 0.4 mg under the tongue every 5 (five) minutes as needed for chest pain.  08/07/18  Yes [provider]  pantoprazole (PROTONIX) 40 MG tablet Take 1 tablet (40 mg total) by mouth daily. 04/29/18  Yes Bettey Costa, MD  Saccharomyces boulardii (PROBIOTIC) 250 MG CAPS Take 1 capsule by mouth 2 times daily at 12 noon and 4 pm. Patient taking differently: Take 1 capsule by mouth 2 (two) times daily.  04/05/19  Yes Merlyn Lot, MD  Saline GEL Place 3 sprays into both nostrils 3 (three) times daily.    Yes [provider]  sulfamethoxazole-trimethoprim (BACTRIM DS) 800-160 MG tablet Take 1 tablet by mouth 2 (two) times daily. 12/23/19 12/30/19 Yes [provider]  vitamin C (VITAMIN C) 500 MG tablet Take 1 tablet (500 mg total) by mouth daily. 01/12/19  Yes Allie Bossier, MD  zinc sulfate 220 (50 Zn) MG capsule Take 1 capsule (220 mg total) by mouth daily. 01/12/19  Yes Allie Bossier, MD    Allergies Bee venom and Enalapril maleate    Social  History Social History   Tobacco Use  . Smoking status: Former Smoker    Packs/day: 0.50    Years: 60.00    Pack years: 30.00    Types: Cigarettes  . Smokeless tobacco: Never Used  . Tobacco comment: quite 82mo ago-03/26/18  Vaping Use  . Vaping Use: Never used  Substance Use Topics  . Alcohol use: No    Alcohol/week: 0.0 standard drinks    Comment: rare  . Drug use: No    Review of Systems Patient denies headaches, rhinorrhea, blurry vision, numbness, shortness of breath, chest pain, edema, cough, abdominal pain, nausea, vomiting, diarrhea, dysuria, fevers, rashes or hallucinations unless otherwise stated above in HPI. ____________________________________________   PHYSICAL EXAM:  VITAL SIGNS: Vitals:   12/28/19 1300 12/28/19 1330  BP: 121/66 134/61  Pulse: 76 77  Resp: 16 19  Temp:    SpO2: 100% 99%    Constitutional: Alert abut drowsy appearing, chronically ill  Eyes: Conjunctivae are normal.  Head: Atraumatic. Nose: No congestion/rhinnorhea. Mouth/Throat: Mucous membranes are moist.   Neck: No stridor. Painless ROM.  Cardiovascular: Normal rate, regular rhythm. Grossly normal heart sounds.  Good peripheral circulation. Respiratory: mild tachypnea with faint expiratory wheeze Gastrointestinal: Soft and nontender. No distention. No abdominal bruits. No CVA tenderness. Genitourinary: deferred Musculoskeletal: No lower extremity tenderness,  1+ BLE edema.  No joint effusions. Neurologic:  No new focal deficits noted Skin:  Skin is warm, dry and intact. No rash noted. Psychiatric: calm  ____________________________________________   LABS (all labs ordered are listed, but only abnormal results are displayed)  Results for orders placed or performed during the hospital encounter of 12/28/19 (from the past 24 hour(s))  CBC with Differential     Status: Abnormal   Collection Time: 12/28/19  9:02 AM  Result Value Ref Range   WBC 4.8 4.0 - 10.5 K/uL   RBC 3.45 (L)  3.87 - 5.11 MIL/uL   Hemoglobin 10.4 (L) 12.0 - 15.0 g/dL   HCT 32.9 (L) 36 - 46 %   MCV 95.4 80.0 - 100.0 fL   MCH 30.1 26.0 - 34.0 pg   MCHC 31.6 30.0 - 36.0 g/dL   RDW 14.6 11.5 - 15.5 %   Platelets 175 150 - 400 K/uL   nRBC 0.0 0.0 - 0.2 %   Neutrophils Relative % 54 %  Neutro Abs 2.5 1.7 - 7.7 K/uL   Lymphocytes Relative 34 %   Lymphs Abs 1.6 0.7 - 4.0 K/uL   Monocytes Relative 7 %   Monocytes Absolute 0.3 0.1 - 1.0 K/uL   Eosinophils Relative 5 %   Eosinophils Absolute 0.3 0.0 - 0.5 K/uL   Basophils Relative 0 %   Basophils Absolute 0.0 0.0 - 0.1 K/uL   Immature Granulocytes 0 %   Abs Immature Granulocytes 0.02 0.00 - 0.07 K/uL  Comprehensive metabolic panel     Status: Abnormal   Collection Time: 12/28/19  9:02 AM  Result Value Ref Range   Sodium 138 135 - 145 mmol/L   Potassium 5.2 (H) 3.5 - 5.1 mmol/L   Chloride 102 98 - 111 mmol/L   CO2 29 22 - 32 mmol/L   Glucose, Bld 84 70 - 99 mg/dL   BUN 31 (H) 8 - 23 mg/dL   Creatinine, Ser 1.94 (H) 0.44 - 1.00 mg/dL   Calcium 9.8 8.9 - 10.3 mg/dL   Total Protein 7.9 6.5 - 8.1 g/dL   Albumin 2.9 (L) 3.5 - 5.0 g/dL   AST 24 15 - 41 U/L   ALT 14 0 - 44 U/L   Alkaline Phosphatase 111 38 - 126 U/L   Total Bilirubin 0.5 0.3 - 1.2 mg/dL   GFR, Estimated 26 (L) >60 mL/min   Anion gap 7 5 - 15  Lactic acid, plasma     Status: None   Collection Time: 12/28/19  9:22 AM  Result Value Ref Range   Lactic Acid, Venous 1.2 0.5 - 1.9 mmol/L  Protime-INR     Status: None   Collection Time: 12/28/19  9:22 AM  Result Value Ref Range   Prothrombin Time 12.3 11.4 - 15.2 seconds   INR 1.0 0.8 - 1.2  APTT     Status: None   Collection Time: 12/28/19  9:22 AM  Result Value Ref Range   aPTT 29 24 - 36 seconds  Blood gas, venous     Status: Abnormal   Collection Time: 12/28/19 10:22 AM  Result Value Ref Range   pH, Ven 7.26 7.25 - 7.43   pCO2, Ven 72 (HH) 44 - 60 mmHg   pO2, Ven 39.0 32 - 45 mmHg   Bicarbonate 32.3 (H) 20.0 - 28.0  mmol/L   Acid-Base Excess 3.3 (H) 0.0 - 2.0 mmol/L   O2 Saturation 64.3 %   Patient temperature 37.0    Collection site VEIN    Sample type VENOUS   Urinalysis, Complete w Microscopic Urine, Catheterized     Status: Abnormal   Collection Time: 12/28/19 11:22 AM  Result Value Ref Range   Color, Urine YELLOW (A) YELLOW   APPearance HAZY (A) CLEAR   Specific Gravity, Urine 1.010 1.005 - 1.030   pH 5.0 5.0 - 8.0   Glucose, UA NEGATIVE NEGATIVE mg/dL   Hgb urine dipstick SMALL (A) NEGATIVE   Bilirubin Urine NEGATIVE NEGATIVE   Ketones, ur NEGATIVE NEGATIVE mg/dL   Protein, ur NEGATIVE NEGATIVE mg/dL   Nitrite NEGATIVE NEGATIVE   Leukocytes,Ua LARGE (A) NEGATIVE   RBC / HPF 6-10 0 - 5 RBC/hpf   WBC, UA 21-50 0 - 5 WBC/hpf   Bacteria, UA MANY (A) NONE SEEN   Squamous Epithelial / LPF 0-5 0 - 5   Mucus PRESENT   Respiratory Panel by RT PCR (Flu A&B, Covid) - Nasopharyngeal Swab     Status: None   Collection Time: 12/28/19  12:25 PM   Specimen: Nasopharyngeal Swab  Result Value Ref Range   SARS Coronavirus 2 by RT PCR NEGATIVE NEGATIVE   Influenza A by PCR NEGATIVE NEGATIVE   Influenza B by PCR NEGATIVE NEGATIVE   ____________________________________________  EKG My review and personal interpretation at Time: 8:48   Indication: ams  Rate: 80  Rhythm: sinus Axis: normal Other: no stemi ____________________________________________  RADIOLOGY  I personally reviewed all radiographic images ordered to evaluate for the above acute complaints and reviewed radiology reports and findings.  These findings were personally discussed with the patient.  Please see medical record for radiology report.  ____________________________________________   PROCEDURES  Procedure(s) performed:  .Critical Care Performed by: Merlyn Lot, MD Authorized by: Merlyn Lot, MD   Critical care provider statement:    Critical care time (minutes):  35   Critical care time was exclusive of:   Separately billable procedures and treating other patients   Critical care was necessary to treat or prevent imminent or life-threatening deterioration of the following conditions:  Respiratory failure   Critical care was time spent personally by me on the following activities:  Development of treatment plan with patient or surrogate, discussions with consultants, evaluation of patient's response to treatment, examination of patient, obtaining history from patient or surrogate, ordering and performing treatments and interventions, ordering and review of laboratory studies, ordering and review of radiographic studies, pulse oximetry, re-evaluation of patient's condition and review of old charts      Critical Care performed: yes ____________________________________________   INITIAL IMPRESSION / [place] / ED COURSE  Pertinent labs & imaging results that were available during my care of the patient were reviewed by me and considered in my medical decision making (see chart for details).   DDX: Dehydration, sepsis, pna, uti, hypoglycemia, cva, drug effect, withdrawal, encephalitis  Katherine [name] is a 79 y.o. who presents to the ED with presentation as described above.  Patient with very complex and extensive past medical history with very broad differential.  Blood will be sent for the but differential.  Will order imaging.  The patient will be placed on continuous pulse oximetry and telemetry for monitoring.  Laboratory evaluation will be sent to evaluate for the above complaints.     Clinical Course as of Dec 27 1404  Sun Dec 28, 2019  1014 Blood work this far is reassuring.  Mild bump in her creatinine therefore will give gentle IV hydration particular as her potassium is just above normal limit.  We will also give albuterol.  Still waiting CT head.   [PR]  1245 Patient is mildly hypercapnic.  Is having more coughing and some improvement in her wheezing after nebulizer.  Suspect  she is having a mild COPD exacerbation may be worsened as she was recently treated for CHF and had her O2 requirement increased to 4 been decreasing her O2 to now 2 L and she is satting well.  After discussion with Dr. Marthenia Rolling of hospitalist will trial on BiPAP for few hours to help correct.  We will continue with treatment for COPD.   [PR]  1406 After further discussion with hospitalist will hold off on BiPAP we will continue with nebulizers decrease supplemental oxygen continue with steroids and treatment for acute respiratory failure with hypercapnia   [PR]    Clinical Course User Index [PR] Merlyn Lot, MD    The patient was evaluated in Emergency Department today for the symptoms described in the history of present illness. He/she  was evaluated in the context of the global COVID-19 pandemic, which necessitated consideration that the patient might be at risk for infection with the SARS-CoV-2 virus that causes COVID-19. Institutional protocols and algorithms that pertain to the evaluation of patients at risk for COVID-19 are in a state of rapid change based on information released by regulatory bodies including the CDC and federal and state organizations. These policies and algorithms were followed during the patient's care in the ED.  As part of my medical decision making, I reviewed the following data within the [facility] notes reviewed and incorporated, Labs reviewed, notes from prior ED visits and [place] Controlled Substance Database   ____________________________________________   FINAL CLINICAL IMPRESSION(S) / ED DIAGNOSES  Final diagnoses:  Altered mental status, unspecified altered mental status type  Hypercapnia      NEW MEDICATIONS STARTED DURING THIS VISIT:  New Prescriptions   No medications on file     Note:  This document was prepared using Dragon voice recognition software and may include unintentional dictation errors.    Merlyn Lot, MD 12/28/19 1405    Merlyn Lot, MD 12/28/19 [phone]

## 2019-12-28 NOTE — Progress Notes (Signed)
Pharmacy Antibiotic Note  Katherine [name] is a 79 y.o. female admitted on 12/28/2019 with UTI.  Pharmacy has been consulted for meropenem dosing. Pt with hx of ESBL E coli UTI in August.  H&P not yet available at this time.   Plan: Meropenem 1 g IV q12h based on current renal function  Height: 5\' 6"  (167.6 cm) Weight: 102.1 kg (225 lb) IBW/kg (Calculated) : 59.3  Temp (24hrs), Avg:97.6 F (36.4 C), Min:97.6 F (36.4 C), Max:97.6 F (36.4 C)  Recent Labs  Lab 12/28/19 0902 12/28/19 0922  WBC 4.8  --   CREATININE 1.94*  --   LATICACIDVEN  --  1.2    Estimated Creatinine Clearance: 28.8 mL/min (A) (by C-G formula based on SCr of 1.94 mg/dL (H)).    Allergies  Allergen Reactions  . Bee Venom Swelling  . Enalapril Maleate Swelling    Antimicrobials this admission: Meropenem 11/14 >>  Dose adjustments this admission:  Microbiology results: 11/14 BCx: collected 11/14 UCx: collected    Thank you for allowing pharmacy to be a part of this patient's care.  Rocky Morel 12/28/2019 1:19 PM

## 2019-12-28 NOTE — ED Notes (Signed)
Pt sitting up eating lunch at this time

## 2019-12-28 NOTE — ED Notes (Signed)
Report given to [place], [place]

## 2019-12-28 NOTE — ED Notes (Signed)
Pt taken for CT 

## 2019-12-28 NOTE — Plan of Care (Signed)
  Problem: Education: Goal: Knowledge of General Education information will improve Description Including pain rating scale, medication(s)/side effects and non-pharmacologic comfort measures Outcome: Progressing   Problem: Health Behavior/Discharge Planning: Goal: Ability to manage health-related needs will improve Outcome: Progressing   

## 2019-12-28 NOTE — ED Notes (Signed)
Called RT to have pt placed on bipap.

## 2019-12-29 DIAGNOSIS — J9620 Acute and chronic respiratory failure, unspecified whether with hypoxia or hypercapnia: Secondary | ICD-10-CM

## 2019-12-29 LAB — URINE CULTURE

## 2019-12-29 LAB — BASIC METABOLIC PANEL
Anion gap: 10 (ref 5–15)
BUN: 45 mg/dL — ABNORMAL HIGH (ref 8–23)
CO2: 23 mmol/L (ref 22–32)
Calcium: 9.5 mg/dL (ref 8.9–10.3)
Chloride: 103 mmol/L (ref 98–111)
Creatinine, Ser: 1.97 mg/dL — ABNORMAL HIGH (ref 0.44–1.00)
GFR, Estimated: 26 mL/min — ABNORMAL LOW (ref >60)
Glucose, Bld: 167 mg/dL — ABNORMAL HIGH (ref 70–99)
Potassium: 5.2 mmol/L — ABNORMAL HIGH (ref 3.5–5.1)
Sodium: 136 mmol/L (ref 135–145)

## 2019-12-29 LAB — GLUCOSE, CAPILLARY
Glucose-Capillary: 81 mg/dL (ref 70–99)
Glucose-Capillary: 121 mg/dL — ABNORMAL HIGH (ref 70–99)
Glucose-Capillary: 98 mg/dL (ref 70–99)
Glucose-Capillary: 170 mg/dL — ABNORMAL HIGH (ref 70–99)
Glucose-Capillary: 171 mg/dL — ABNORMAL HIGH (ref 70–99)

## 2019-12-29 LAB — CBC
HCT: 33 % — ABNORMAL LOW (ref 36.0–46.0)
Hemoglobin: 10.6 g/dL — ABNORMAL LOW (ref 12.0–15.0)
MCH: 29.7 pg (ref 26.0–34.0)
MCHC: 32.1 g/dL (ref 30.0–36.0)
MCV: 92.4 fL (ref 80.0–100.0)
Platelets: 199 10*3/uL (ref 150–400)
RBC: 3.57 MIL/uL — ABNORMAL LOW (ref 3.87–5.11)
RDW: 14.2 % (ref 11.5–15.5)
WBC: 4.8 10*3/uL (ref 4.0–10.5)
nRBC: 0 % (ref 0.0–0.2)

## 2019-12-29 LAB — HEMOGLOBIN A1C
Hgb A1c MFr Bld: 5.3 % (ref 4.8–5.6)
Mean Plasma Glucose: 105.41 mg/dL

## 2019-12-29 LAB — BRAIN NATRIURETIC PEPTIDE: B Natriuretic Peptide: 63.5 pg/mL (ref 0.0–100.0)

## 2019-12-29 MED ORDER — ENOXAPARIN SODIUM 30 MG/0.3ML ~~LOC~~ SOLN
30.0000 mg | SUBCUTANEOUS | Status: DC
  Administered 2019-12-29: 30 mg via SUBCUTANEOUS
  Filled 2019-12-29: qty 0.3

## 2019-12-29 MED ORDER — LEVETIRACETAM 750 MG PO TABS
750.0000 mg | ORAL_TABLET | Freq: Two times a day (BID) | ORAL | Status: DC
  Administered 2019-12-30: 750 mg via ORAL
  Filled 2019-12-29 (×2): qty 1

## 2019-12-29 MED ORDER — PREDNISONE 20 MG PO TABS
40.0000 mg | ORAL_TABLET | Freq: Every day | ORAL | Status: DC
  Administered 2019-12-29 – 2019-12-30 (×2): 40 mg via ORAL
  Filled 2019-12-29 (×2): qty 2

## 2019-12-29 MED ORDER — SODIUM CHLORIDE 0.9 % IV SOLN: INTRAVENOUS | Status: DC

## 2019-12-29 MED ORDER — ORAL CARE MOUTH RINSE
15.0000 mL | Freq: Two times a day (BID) | OROMUCOSAL | Status: DC
  Administered 2019-12-30: 15 mL via OROMUCOSAL

## 2019-12-29 MED ORDER — FOSFOMYCIN TROMETHAMINE 3 G PO PACK
3.0000 g | PACK | Freq: Once | ORAL | Status: AC
  Administered 2019-12-29: 3 g via ORAL
  Filled 2019-12-29: qty 3

## 2019-12-29 NOTE — Plan of Care (Signed)

## 2019-12-29 NOTE — Progress Notes (Signed)
PROGRESS NOTE    Katherine [name]  GXQ:[id number] DOB: [birth year] DOA: 12/28/2019 PCP: [facility] [place]  Outpatient Specialists: cardiology, pulmonology    Brief Narrative:   Katherine [name] is a 79 y.o. 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  At baseline patient is usually awake, alert and oriented to person place and time but per nursing home she was not at her baseline.  Patient with complaints of severe frontal headache. During my assessment patient is lethargic but arouses to verbal stimuli and is oriented to person and place. She was treated for urinary tract infection and she complains of a headache but she denies having any chest pain, no shortness of breath, no fever, no chills, no cough, no abdominal pain or any changes in her bowel habits. Venous blood gas shows 7.26/72/39/32/64.3 Respiratory viral panel is negative Patient has significant pyuria Sodium 138, potassium 5.2, chloride 102, bicarb 29, glucose 84, BUN 31, creatinine 1.94, calcium 9.8, alkaline phosphatase 111, albumin 2.9, AST 24, ALT 14, total protein 7.9, lactic acid 1.2, white count 4.8, hemoglobin 10.4, hematocrit 32.9, MCV 95.4, RDW 14.6, platelet count 175, PT 12.3, INR 1.0 CT scan of the head without contrast shows no acute intracranial abnormality.  Atrophy with chronic small vessel white matter ischemic disease.  Right mastoid effusion, stable. Chest x-ray reviewed by me shows streaky opacities in bilateral lower lobes which may represent scarring versus atelectasis. Twelve-lead EKG reviewed by me shows sinus rhythm with a left anterior fascicular block    ED Course: Patient is a 79 year old female who resides in a skilled nursing  facility and who was sent to the ER for evaluation of mental status changes.  She is noted to have hypercapnic respiratory failure and she has pyuria.  She will be admitted to the hospital for further evaluation.   Assessment & Plan:   Principal Problem:   Respiratory failure, acute-on-chronic (HCC) Active Problems:   Morbid obesity (HCC)   Chronic diastolic heart failure (HCC)   COPD with acute exacerbation (HCC)   Acute encephalopathy   Stroke ([place])   Type 2 diabetes mellitus with stage 4 chronic kidney disease ([place])  # Acute encephalopathy - possibly hypercarbic. Possibly infections from urinary source. Has had several hospitalizations for such. Also carries hx of seizure disorder. Nursing says patient had appeared to return to baseline yesterday afternoon; this morning she is somnolent and not very arousable. - weaning O2 as below   # Acute on chronic respiratory failure  On 2 L [place] at baseline. With hypercarbia greater than baseline, now resolved. Excessive oxygenation may have contributed. Per nursing had returned to baseline yesterday afternoon. This morning somnolent, somewhat arousable. Currently on 3 L [place] - Wean O2 with goal of 92%  # Debility - PT/OT consult  # COPD with possible acute exacerbation - continue scheduled duonebs - prednisone daily - hold home trelegy  # Headache - CT unremarkable, no neck pain or stiffness, no fever, WBC not elevated - holding on LP for now, will monitor  # Seizure disorder - Continue Keppra and lacosamide - Placed patient on seizure precautions - consider EEG, neuro involvement if does not continue to make progress today  # Bacteriuria Hx of ESBL e coli in urine, unable to get hx from patient today and no  report of uti symptoms. Think this likely a contaminant but prudent to continue abx for now. No fever, leukocytosis. - continue meropenem - f/u cultures  # Diabetes mellitus A1c wnl - hold home orals - SSI  # Acute  kidney injury on stage 3/4 ckd - baseline cr of around 1.6, here 1.97. Likely prerenal given AMS - gentle hydration NS @ 75  # HFpEF Here does not appear to be volume overloaded, in fact a bit dehydrated - gentle fluids as above  # Hyperkalemia - mild, 5.2, likely 2/2 aki - gentle fluids as above  DVT prophylaxis: lovenox Code Status: full Family Communication: none @ bedside  Status is: Inpatient  Remains inpatient appropriate because:Inpatient level of care appropriate due to severity of illness   Dispo: The patient is from: SNF              Anticipated d/c is to: SNF              Anticipated d/c date is: 2-3 days              Patient currently is not medically stable to d/c.        Consultants:  none  Procedures: none  Antimicrobials:  Meropenem 11/14>    Subjective: This morning asleep, arouses somewhat when moved, doesn't answer my questions  Objective: Vitals:   12/28/19 2004 12/28/19 2013 12/29/19 0340 12/29/19 0341  BP:  115/77  118/82  Pulse:  93  96  Resp:  20  18  Temp:  98.7 F (37.1 C)  98.4 F (36.9 C)  TempSrc:  Oral  Oral  SpO2: 97% 100%  97%  Weight:   94.4 kg   Height:        Intake/Output Summary (Last 24 hours) at 12/29/2019 0736 Last data filed at 12/29/2019 0538 Gross per 24 hour  Intake 608.52 ml  Output 552 ml  Net 56.52 ml   Filed Weights   12/28/19 0842 12/29/19 0340  Weight: 102.1 kg 94.4 kg    Examination:  General exam: sleeping, chronically ill appearing, obese Respiratory system: poor effort, faint rales @ bases Cardiovascular system: S1 & S2 heard, soft systolic murmur, rr Gastrointestinal system: Abdomen is nondistended, obese, soft and nontender. No organomegaly or masses felt. Normal bowel sounds heard. Central nervous system: somnolent, arousable somewhat. Moving all 4 extremities. No nuchal rigidity Extremities: Symmetric 5 x 5 power. Skin: No rashes, trace LE edema Psychiatry: unable to assess.      Data Reviewed: I have personally reviewed following labs and imaging studies  CBC: Recent Labs  Lab 12/28/19 0902 12/29/19 0426  WBC 4.8 4.8  NEUTROABS 2.5  --   HGB 10.4* 10.6*  HCT 32.9* 33.0*  MCV 95.4 92.4  PLT 175 433   Basic Metabolic Panel: Recent Labs  Lab 12/28/19 0902 12/29/19 0426  NA 138 136  K 5.2* 5.2*  CL 102 103  CO2 29 23  GLUCOSE 84 167*  BUN 31* 45*  CREATININE 1.94* 1.97*  CALCIUM 9.8 9.5   GFR: Estimated Creatinine Clearance: 27.2 mL/min (A) (by C-G formula based on SCr of 1.97 mg/dL (H)). Liver Function Tests: Recent Labs  Lab 12/28/19 0902  AST 24  ALT 14  ALKPHOS 111  BILITOT 0.5  PROT 7.9  ALBUMIN 2.9*   No results for input(s): LIPASE, AMYLASE in the last 168 hours. No results for input(s): AMMONIA in the last 168 hours. Coagulation Profile: Recent Labs  Lab 12/28/19 0922  INR 1.0  Cardiac Enzymes: No results for input(s): CKTOTAL, CKMB, CKMBINDEX, TROPONINI in the last 168 hours. BNP (last 3 results) No results for input(s): PROBNP in the last 8760 hours. HbA1C: Recent Labs    12/28/19 1810  HGBA1C 5.3   CBG: Recent Labs  Lab 12/28/19 2117  GLUCAP 156*   Lipid Profile: No results for input(s): CHOL, HDL, LDLCALC, TRIG, CHOLHDL, LDLDIRECT in the last 72 hours. Thyroid Function Tests: No results for input(s): TSH, T4TOTAL, FREET4, T3FREE, THYROIDAB in the last 72 hours. Anemia Panel: No results for input(s): VITAMINB12, FOLATE, FERRITIN, TIBC, IRON, RETICCTPCT in the last 72 hours. Urine analysis:    Component Value Date/Time   COLORURINE YELLOW (A) 12/28/2019 1122   APPEARANCEUR HAZY (A) 12/28/2019 1122   APPEARANCEUR Clear 02/20/2013 1459   LABSPEC 1.010 12/28/2019 1122   LABSPEC 1.012 02/20/2013 1459   PHURINE 5.0 12/28/2019 1122   GLUCOSEU NEGATIVE 12/28/2019 1122   GLUCOSEU Negative 02/20/2013 1459   HGBUR SMALL (A) 12/28/2019 1122   BILIRUBINUR NEGATIVE 12/28/2019 1122   BILIRUBINUR Negative  02/20/2013 1459   KETONESUR NEGATIVE 12/28/2019 1122   PROTEINUR NEGATIVE 12/28/2019 1122   NITRITE NEGATIVE 12/28/2019 1122   LEUKOCYTESUR LARGE (A) 12/28/2019 1122   LEUKOCYTESUR Negative 02/20/2013 1459   Sepsis Labs: @LABRCNTIP (procalcitonin:4,lacticidven:4)  ) Recent Results (from the past 240 hour(s))  Blood culture (routine single)     Status: None (Preliminary result)   Collection Time: 12/28/19  9:23 AM   Specimen: BLOOD  Result Value Ref Range Status   Specimen Description BLOOD RAC  Final   Special Requests   Final    BOTTLES DRAWN AEROBIC AND ANAEROBIC Blood Culture adequate volume   Culture   Final    NO GROWTH < 24 HOURS Performed at [facility], [address] [place], [place] [address]    Report Status PENDING  Incomplete  Respiratory Panel by RT PCR (Flu A&B, Covid) - Nasopharyngeal Swab     Status: None   Collection Time: 12/28/19 12:25 PM   Specimen: Nasopharyngeal Swab  Result Value Ref Range Status   SARS Coronavirus 2 by RT PCR NEGATIVE NEGATIVE Final    Comment: (NOTE) SARS-CoV-2 target nucleic acids are NOT DETECTED.  The SARS-CoV-2 RNA is generally detectable in upper respiratoy specimens during the acute phase of infection. The lowest concentration of SARS-CoV-2 viral copies this assay can detect is 131 copies/mL. A negative result does not preclude SARS-Cov-2 infection and should not be used as the sole basis for treatment or other patient management decisions. A negative result may occur with  improper specimen collection/handling, submission of specimen other than nasopharyngeal swab, presence of viral mutation(s) within the areas targeted by this assay, and inadequate number of viral copies (<131 copies/mL). A negative result must be combined with clinical observations, patient history, and epidemiological information. The expected result is Negative.  Fact Sheet for Patients:  PinkCheek.be  Fact  Sheet for Healthcare Providers:  GravelBags.it  This test is no t yet approved or cleared by the [place] FDA and  has been authorized for detection and/or diagnosis of SARS-CoV-2 by FDA under an Emergency Use Authorization (EUA). This EUA will remain  in effect (meaning this test can be used) for the duration of the COVID-19 declaration under Section 564(b)(1) of the Act, 21 U.S.C. section 360bbb-3(b)(1), unless the authorization is terminated or revoked sooner.     Influenza A by PCR NEGATIVE NEGATIVE Final   Influenza B by PCR NEGATIVE NEGATIVE Final  Comment: (NOTE) The Xpert Xpress SARS-CoV-2/FLU/RSV assay is intended as an aid in  the diagnosis of influenza from Nasopharyngeal swab specimens and  should not be used as a sole basis for treatment. Nasal washings and  aspirates are unacceptable for Xpert Xpress SARS-CoV-2/FLU/RSV  testing.  Fact Sheet for Patients: PinkCheek.be  Fact Sheet for Healthcare Providers: GravelBags.it  This test is not yet approved or cleared by the [place] FDA and  has been authorized for detection and/or diagnosis of SARS-CoV-2 by  FDA under an Emergency Use Authorization (EUA). This EUA will remain  in effect (meaning this test can be used) for the duration of the  Covid-19 declaration under Section 564(b)(1) of the Act, 21  U.S.C. section 360bbb-3(b)(1), unless the authorization is  terminated or revoked. Performed at [facility], [address] [place], [place] [address]          Radiology Studies: CT Head Wo Contrast  Result Date: 12/28/2019 CLINICAL DATA:  Mental status change. EXAM: CT HEAD WITHOUT CONTRAST TECHNIQUE: Contiguous axial images were obtained from the base of the skull through the vertex without intravenous contrast. COMPARISON:  03/02/2019 FINDINGS: Brain: There is no evidence for acute hemorrhage,  hydrocephalus, mass lesion, or abnormal extra-axial fluid collection. No definite CT evidence for acute infarction. Diffuse loss of parenchymal volume is consistent with atrophy. Patchy low attenuation in the deep hemispheric and periventricular white matter is nonspecific, but likely reflects chronic microvascular ischemic demyelination. Vascular: No hyperdense vessel or unexpected calcification. Skull: No evidence for fracture. No worrisome lytic or sclerotic lesion. Sinuses/Orbits: Right mastoid effusion again noted. Visualized paranasal sinuses are clear. Visualized portions of the globes and intraorbital fat are unremarkable. Other: None. IMPRESSION: 1. No acute intracranial abnormality. 2. Atrophy with chronic small vessel white matter ischemic disease. 3. Right mastoid effusion, stable. Electronically Signed   By: Misty Stanley M.D.   On: 12/28/2019 10:47   DG Chest Portable 1 View  Result Date: 12/28/2019 CLINICAL DATA:  Altered mental status. EXAM: PORTABLE CHEST 1 VIEW COMPARISON:  September 25, 2019 FINDINGS: Cardiomediastinal silhouette is normal. Mediastinal contours appear intact. Calcific atherosclerotic disease and tortuosity of the aorta. There is no evidence of focal airspace consolidation, pleural effusion or pneumothorax. Streaky opacities in bilateral lower lobes may represent scarring versus atelectasis. Osseous structures are without acute abnormality. Soft tissues are grossly normal. IMPRESSION: Streaky opacities in bilateral lower lobes may represent scarring versus atelectasis. No acute findings. Electronically Signed   By: Fidela Salisbury M.D.   On: 12/28/2019 09:37        Scheduled Meds: . acetaminophen  650 mg Oral TID  . ascorbic acid  500 mg Oral Daily  . atorvastatin  80 mg Oral QPM  . docusate sodium  100 mg Oral BID  . enoxaparin (LOVENOX) injection  40 mg Subcutaneous Q24H  . ferrous sulfate  325 mg Oral BID  . fluticasone furoate-vilanterol  1 puff Inhalation  Daily  . gabapentin  300 mg Oral QHS  . insulin aspart  0-20 Units Subcutaneous TID WC  . ipratropium-albuterol  3 mL Nebulization TID  . lacosamide  100 mg Oral BID  . levETIRAcetam  1,500 mg Oral Daily  . loratadine  10 mg Oral Daily  . mupirocin ointment  1 application Nasal BID  . pantoprazole  40 mg Oral Daily  . saccharomyces boulardii  250 mg Oral BID  . sodium chloride flush  3 mL Intravenous Q12H  . umeclidinium bromide  1 puff Inhalation Daily  .  zinc sulfate  220 mg Oral Daily   Continuous Infusions: . sodium chloride Stopped (12/28/19 2216)  . meropenem (MERREM) IV Stopped (12/28/19 2116)     LOS: 1 day    Time spent: 44 min    Desma Maxim, MD [facility]   If 7PM-7AM, please contact night-coverage [email or web address] Password [facility] 12/29/2019, 7:36 AM

## 2019-12-29 NOTE — Evaluation (Signed)
Physical Therapy Evaluation Patient Details Name: Katherine [name] MRN: [id number] DOB: 19-Nov-1940 Today's Date: 12/29/2019   History of Present Illness  Katherine [name] is a 79 y.o.femalewith medical history significant forDM with complications of stage IV chronic kidney disease, hypertension, history of CVA, anxiety and depression, chronic diastolic dysfunction CHF,COPD with chronic respiratory failure to the emergency room from [place] for evaluation of mental status changes. At baseline patient is usually awake, alert and oriented to person place and time but per nursing home she was not at her baseline. Patient with complaints of severe frontal headache.    Clinical Impression  Pt easily woken, at baseline per chart is oriented x3, but currently only provided PT with her first name, some confusion with tasks/commands noted. PT did contact [facility] to establish pt PLOF. Per facility, pt is primarily bedbound; is able to assist with bed mobility and feeds herself, but is reliant on hoyer lift for transfers if performed.   Upon assessment pt able to move all extremities against gravity. Able to follow one step commands with increased time. Rolling L and R performed, heavy use of bed rails and pt with more difficulty rolling L than R, ultimately minA for complete sidelying position. ModA to scoot up towards [facility], pt with good effort for all tasks. The patient demonstrated baseline level of functioning, no further acute PT needs indicated. PT to sign off. Please reconsult PT if pt status changes or acute needs are identified.      Follow Up Recommendations No PT follow up    Equipment Recommendations  None recommended by PT    Recommendations for Other Services       Precautions / Restrictions Precautions Precautions: Fall Restrictions Weight Bearing Restrictions: No      Mobility  Bed Mobility Overal bed mobility: Needs Assistance Bed Mobility:  Rolling Rolling: Min assist         General bed mobility comments: minA for complete sidelying position to be achieved. Heavy use of bed rails. Pt able to assist with scooting in bed in supine, ultimately modA to scoot up in bed    Transfers                 General transfer comment: deferred, pt at baseline does not transfer unless with hoyer lift  Ambulation/Gait             General Gait Details: deferred, pt at baseline does not transfer unless with hoyer lift  Stairs            Wheelchair Mobility    Modified Rankin (Stroke Patients Only)       Balance                                             Pertinent Vitals/Pain Pain Assessment: No/denies pain    Home Living Family/patient expects to be discharged to:: Skilled nursing facility                      Prior Function           Comments: PT spoke with facility. Pt is bedbound at baseline; assists with bed mobilty, feeds herself and calls her husband. Facility uses a [employer] fast streamer for transfers if necessary     Hand Dominance        Extremity/Trunk Assessment   Upper Extremity Assessment Upper  Extremity Assessment: Generalized weakness    Lower Extremity Assessment Lower Extremity Assessment: Generalized weakness       Communication   Communication: HOH;Other (comment) (tangential)  Cognition Arousal/Alertness: Awake/alert Behavior During Therapy: WFL for tasks assessed/performed Overall Cognitive Status: Impaired/Different from baseline Area of Impairment: Orientation;Following commands;Problem solving;Safety/judgement                 Orientation Level: Disoriented to;Place;Time;Situation     Following Commands: Follows one step commands with increased time Safety/Judgement: Decreased awareness of safety;Decreased awareness of deficits   Problem Solving: Slow processing;Requires tactile cues;Requires verbal cues General Comments: Per chart pt  typically oreinted x3, oriented to self only this date.       General Comments      Exercises     Assessment/Plan    PT Assessment Patent does not need any further PT services  PT Problem List         PT Treatment Interventions      PT Goals (Current goals can be found in the Care Plan section)       Frequency     Barriers to discharge        Co-evaluation               AM-PAC PT "6 Clicks" Mobility  Outcome Measure Help needed turning from your back to your side while in a flat bed without using bedrails?: A Lot Help needed moving from lying on your back to sitting on the side of a flat bed without using bedrails?: A Lot Help needed moving to and from a bed to a chair (including a wheelchair)?: Total Help needed standing up from a chair using your arms (e.g., wheelchair or bedside chair)?: Total Help needed to walk in hospital room?: Total Help needed climbing 3-5 steps with a railing? : Total 6 Click Score: 8    End of Session Equipment Utilized During Treatment: Oxygen Activity Tolerance: Patient tolerated treatment well Patient left: in bed;with call bell/phone within reach;with bed alarm set Nurse Communication: Mobility status PT Visit Diagnosis: Other abnormalities of gait and mobility (R26.89)    Time: 8185-6314 PT Time Calculation (min) (ACUTE ONLY): 10 min   Charges:   PT Evaluation $PT Eval Low Complexity: 1 Low         Lieutenant Diego PT, DPT 2:27 PM,12/29/19

## 2019-12-29 NOTE — Evaluation (Signed)
Occupational Therapy Evaluation Patient Details Name: Katherine [name] MRN: [id number] DOB: 1940-08-31 Today's Date: 12/29/2019    History of Present Illness Katherine [name] is a 79 y.o.femalewith medical history significant forDM with complications of stage IV chronic kidney disease, hypertension, history of CVA, anxiety and depression, chronic diastolic dysfunction CHF,COPD with chronic respiratory failure to the emergency room from [place] for evaluation of mental status changes. At baseline patient is usually awake, alert and oriented to person place and time but per nursing home she was not at her baseline. Patient with complaints of severe frontal headache.   Clinical Impression   Katherine [name] was seen for OT evaluation this date. Prior to hospital admission, pt was resident at [facility]. A&O to self only. Pt reports using pampers for toileting and unable to state last time she walked. Pt presents to acute OT demonstrating impaired ADL performance and functional mobility 2/2 decreased activity tolerance, functional strength/balance deficits, and pain. Pt currently requires MOD A rolling bed level, MOD A sup>sit, MAX A sit>sup. Attempted standing trial however unable to clear rear from bed -  pt endorses L knee pain (not quantified, RN notified). MAX A don B socks seated EOB. CGA static sitting balance intermittently requiring MIN A. Pt would benefit from skilled OT to address noted impairments and functional limitations (see below for any additional details) in order to maximize safety and independence while minimizing falls risk and caregiver burden. Upon hospital discharge, recommend STR to maximize pt safety and return to PLOF.     Follow Up Recommendations  SNF    Equipment Recommendations  Other (comment) (TBD)    Recommendations for Other Services       Precautions / Restrictions Precautions Precautions: Fall Restrictions Weight Bearing Restrictions: No       Mobility Bed Mobility Overal bed mobility: Needs Assistance Bed Mobility: Supine to Sit;Sit to Supine;Rolling Rolling: Mod assist   Supine to sit: Mod assist Sit to supine: Max assist        Transfers    General transfer comment: unable to clear rear from bed - pt endorses L knee pain (not quantified) - RN notified.     Balance Overall balance assessment: Needs assistance Sitting-balance support: Bilateral upper extremity supported;Feet supported Sitting balance-Leahy Scale: Fair Sitting balance - Comments: intermittent MIN A for static sitting             ADL either performed or assessed with clinical judgement   ADL Overall ADL's : Needs assistance/impaired         General ADL Comments: MAX A don B socks seated EOB. MOD A for toileting at bed level. CGA seated grooming tasks                   Pertinent Vitals/Pain Pain Assessment: Faces Faces Pain Scale: Hurts even more Pain Location: L knee c ROM Pain Descriptors / Indicators: Grimacing;Discomfort Pain Intervention(s): Limited activity within patient's tolerance;Repositioned     Hand Dominance     Extremity/Trunk Assessment Upper Extremity Assessment Upper Extremity Assessment: Generalized weakness   Lower Extremity Assessment Lower Extremity Assessment: Generalized weakness       Communication Communication Communication: HOH;Other (comment) (tangential and perseverative )   Cognition Arousal/Alertness: Lethargic Behavior During Therapy: WFL for tasks assessed/performed Overall Cognitive Status: Impaired/Different from baseline Area of Impairment: Orientation;Following commands;Problem solving;Safety/judgement                 Orientation Level: Disoriented to;Place;Time;Situation     Following Commands: Follows one  step commands with increased time Safety/Judgement: Decreased awareness of safety;Decreased awareness of deficits   Problem Solving: Slow processing;Requires  tactile cues;Requires verbal cues General Comments: Per chart pt typically oreinted x3, oriented to self only this date.    General Comments  SpO2 90% on 2L [place]     Exercises Exercises: Other exercises Other Exercises Other Exercises: Pt educated re: OT role, falls prevention, ECS Other Exercises: LBD, simulated toileting at bed level, sup<>sit, rolling, sitting balance/tolerance   Shoulder Instructions      Home Living Family/patient expects to be discharged to:: Skilled nursing facility           Prior Functioning/Environment Level of Independence: Needs assistance        Comments: Pt reports using pampers for toileting. States "if they helped me get to a w/c I could move more"         OT Problem List: Decreased strength;Decreased activity tolerance;Impaired balance (sitting and/or standing);Decreased safety awareness;Pain      OT Treatment/Interventions: Self-care/ADL training;Therapeutic exercise;Energy conservation;DME and/or AE instruction;Therapeutic activities;Patient/family education;Balance training    OT Goals(Current goals can be found in the care plan section) Acute Rehab OT Goals Patient Stated Goal: to walk  OT Goal Formulation: With patient Time For Goal Achievement: 01/12/20 Potential to Achieve Goals: Good ADL Goals Pt Will Perform Grooming: with modified independence;sitting Pt Will Perform Lower Body Dressing: with mod assist;sitting/lateral leans (c LRAD PRN) Pt Will Transfer to Toilet: stand pivot transfer;bedside commode;with max assist (c LRAD PRN)  OT Frequency: Min 1X/week    AM-PAC OT "6 Clicks" Daily Activity     Outcome Measure Help from another person eating meals?: None Help from another person taking care of personal grooming?: A Little Help from another person toileting, which includes using toliet, bedpan, or urinal?: A Lot Help from another person bathing (including washing, rinsing, drying)?: A Lot Help from another person to put  on and taking off regular upper body clothing?: A Little Help from another person to put on and taking off regular lower body clothing?: A Lot 6 Click Score: 16   End of Session Equipment Utilized During Treatment: Oxygen Nurse Communication: Mobility status  Activity Tolerance: Patient limited by lethargy;Patient limited by pain Patient left: in bed;with call bell/phone within reach;with bed alarm set  OT Visit Diagnosis: Muscle weakness (generalized) (M62.81)                Time: 6294-7654 OT Time Calculation (min): 15 min Charges:  OT General Charges $OT Visit: 1 Visit OT Evaluation $OT Eval Low Complexity: 1 Low OT Treatments $Self Care/Home Management : 8-22 mins  Dessie Coma, M.S. OTR/L  12/29/19, 10:46 AM  ascom [phone]

## 2019-12-29 NOTE — Progress Notes (Signed)
PHARMACIST - PHYSICIAN COMMUNICATION  CONCERNING:  Enoxaparin (Lovenox) for DVT Prophylaxis    RECOMMENDATION: Patient was prescribed enoxaparin 40mg  q24 hours for VTE prophylaxis.   Filed Weights   12/28/19 0842 12/29/19 0340  Weight: 102.1 kg (225 lb) 94.4 kg (208 lb 1.8 oz)    Body mass index is 33.59 kg/m.  Estimated Creatinine Clearance: 27.2 mL/min (A) (by C-G formula based on SCr of 1.97 mg/dL (H)).   Patient is candidate for enoxaparin 30mg  every 24 hours based on CrCl <18ml/min or Weight <45kg  DESCRIPTION: Pharmacy has adjusted enoxaparin dose per [facility] policy.  Patient is now receiving enoxaparin 30 mg every 24 hours    Katherine [name] 12/29/2019 11:34 AM

## 2019-12-29 NOTE — TOC Initial Note (Signed)
Transition of Care [facility]) - Initial/Assessment Note    Patient Details  Name: Katherine [name] MRN: [id number] Date of Birth: 02-Jul-1940  Transition of Care [facility]) CM/SW Contact:    Kerin Salen, RN Phone Number: 12/29/2019, 10:28 AM  Clinical Narrative:   CM spoke with patient and Husband, Bennie Hind at bedside. Patient alert and oriented to place. Husband states patient is currently a resident [facility], for [facility] care. [facility] provides total care to include medication treatment, ADL's and meals. Per Husband patient will return to facility.              Expected Discharge Plan: [facility]) Barriers to Discharge: Continued Medical Work up   Patient Goals and CMS Choice Patient states their goals for this hospitalization and ongoing recovery are:: To return to SNF      Expected Discharge Plan and Services Expected Discharge Plan: [place] [facility])     Post Acute Care Choice: [place] Living arrangements for the past 2 months: [place] ([place])                                      Prior Living Arrangements/Services Living arrangements for the past 2 months: [place] [facility]) Lives with:: Other (Comment) (SNF) Patient language and need for interpreter reviewed:: Yes Do you feel safe going back to the place where you live?: Yes      Need for Family Participation in Patient Care: Yes (Comment)   Current home services: Other (comment) (SNF) Criminal Activity/Legal Involvement Pertinent to Current Situation/Hospitalization: No - Comment as needed  Activities of Daily Living Home Assistive Devices/Equipment: None ADL Screening (condition at time of admission) Patient's cognitive ability adequate to safely complete daily activities?: Yes Is the patient deaf or have difficulty hearing?: No Does the patient have difficulty seeing,  even when wearing glasses/contacts?: Yes Does the patient have difficulty concentrating, remembering, or making decisions?: No Patient able to express need for assistance with ADLs?: Yes Does the patient have difficulty dressing or bathing?: Yes Independently performs ADLs?: No Communication: Independent with device (comment) Dressing (OT): Dependent Is this a change from baseline?: Pre-admission baseline Grooming: Dependent Does the patient have difficulty walking or climbing stairs?: Yes Weakness of Legs: Both Weakness of Arms/Hands: Both  Permission Sought/Granted                  Emotional Assessment Appearance:: Appears stated age Attitude/Demeanor/Rapport: Engaged Affect (typically observed): Appropriate Orientation: : Oriented to Self, Oriented to Place Alcohol / Substance Use: Not Applicable Psych Involvement: No (comment)  Admission diagnosis:  Hypercapnia [R06.89] Respiratory failure, acute-on-chronic (HCC) [J96.20] Altered mental status, unspecified altered mental status type [R41.82] Patient Active Problem List   Diagnosis Date Noted  . Respiratory failure, acute-on-chronic ([place]) 12/28/2019  . Type 2 diabetes mellitus with stage 4 chronic kidney disease ([place]) 12/28/2019  . Sepsis due to gram-negative UTI ([place]) 06/19/2019  . Type II diabetes mellitus with renal manifestations ([place]) 06/19/2019  . Stroke ([place]) 06/19/2019  . AAA (abdominal aortic aneurysm) ([place]) 06/19/2019  . Acute metabolic encephalopathy 46/50/3546  . Iron deficiency anemia   . Altered mental status   . Acute encephalopathy 02/11/2019  . Sepsis secondary to UTI ([place]) 01/08/2019  . Acute renal failure superimposed on stage 3b chronic kidney disease ([place]) 01/08/2019  . Seizure disorder ([place]) 01/08/2019  .  Diabetes mellitus type 2, controlled, with complications ([place]) 67/01/4579  . Acute on chronic anemia 01/03/2019  . COVID-19 12/21/2018  . COVID-19 virus infection 12/21/2018  . Hemoptysis  12/20/2018  . Pulmonary embolism ([place]) 11/21/2018  . Delirium   . UTI (urinary tract infection) 08/21/2018  . Chest pain 07/15/2018  . Palliative care encounter 05/10/2018  . Localized edema 05/10/2018  . Shortness of breath 05/10/2018  . Hematemesis 04/28/2018  . Influenza A 04/13/2018  . Acute on chronic congestive heart failure ([place])   . COPD with acute exacerbation ([place]) 02/24/2018  . Chronic diastolic heart failure ([place]) 12/31/2017  . Lymphedema 12/31/2017  . COPD exacerbation ([place]) 11/30/2017  . Urinary tract infection 11/22/2017  . 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[place]) 11/16/2017  . Diabetes mellitus type 2, uncomplicated ([place]) 99/83/3825  . Pressure injury of skin 10/29/2017  . Acute kidney injury superimposed on CKD ([place]) 10/24/2017  . Sepsis ([place]) 10/24/2017  . Possible Seizures ([place]) 10/08/2017  . Hyperlipemia 10/08/2017  . 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 Stroke-like episode ([place]) s/p IV tpa 10/04/2017  . Palliative care by specialist   . Elevated rheumatoid factor 09/05/2017  . Frequent hospital admissions 08/22/2017  . Aphasia 06/28/2017  . Right hand pain 03/21/2017  . Dry skin 03/21/2017  . Pain in finger of left hand 09/12/2016  . Chronic fatigue 06/12/2016  . Left knee pain 06/12/2016  . Goals of care, counseling/discussion 03/13/2016  . Primary localized osteoarthritis of right knee 02/08/2016  . OSA (obstructive sleep apnea) 09/16/2015  . Rotator cuff syndrome 09/07/2015  . 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 Sleep disturbance 04/14/2015  . Coronary artery disease 03/14/2015  . 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 Acute respiratory failure with hypoxia ([place]) 09/16/2014  . 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 Anxiety   . Tobacco abuse   . Prurigo nodularis   . GERD (gastroesophageal reflux disease)   . COPD (chronic obstructive pulmonary disease) ([place])   . Osteoarthritis   . Vitamin D deficiency disease   . Chronic  constipation   . CKD (chronic kidney disease), stage III ([place])   . Essential hypertension 05/20/2013  . Morbid obesity ([place]) 05/20/2013   PCP:  Care, [place]:   [place], [address] [place] [address] Phone: [phone] Fax: [phone]     Social Determinants of Health (SDOH) Interventions    Readmission Risk Interventions Readmission Risk Prevention Plan 12/29/2019 09/26/2019 11/26/2018  Transportation Screening Complete Complete Complete  Medication Review Press photographer) Complete Complete Complete  PCP or Specialist appointment within 3-5 days of discharge Not Complete - (No Data)  PCP/Specialist Appt Not Complete comments Will return to SNF - -  [place] or Home Care Consult Complete - (No Data)  SW Recovery Care/Counseling Consult Complete Complete Complete  SW Consult Not Complete Comments - - -  Palliative Care Screening Not Applicable Not Applicable -  Medication Reconcilation ([place] Complete Not Applicable Complete  Some recent data might be hidden

## 2019-12-29 NOTE — Plan of Care (Signed)
  Problem: Education: Goal: Knowledge of General Education information will improve Description: Including pain rating scale, medication(s)/side effects and non-pharmacologic comfort measures Outcome: Progressing   Problem: Pain Managment: Goal: General experience of comfort will improve Outcome: Progressing   Problem: Safety: Goal: Ability to remain free from injury will improve Outcome: Progressing   

## 2019-12-30 LAB — BASIC METABOLIC PANEL
Anion gap: 5 (ref 5–15)
BUN: 44 mg/dL — ABNORMAL HIGH (ref 8–23)
CO2: 25 mmol/L (ref 22–32)
Calcium: 8.7 mg/dL — ABNORMAL LOW (ref 8.9–10.3)
Chloride: 108 mmol/L (ref 98–111)
Creatinine, Ser: 1.64 mg/dL — ABNORMAL HIGH (ref 0.44–1.00)
GFR, Estimated: 32 mL/min — ABNORMAL LOW (ref >60)
Glucose, Bld: 103 mg/dL — ABNORMAL HIGH (ref 70–99)
Potassium: 4.7 mmol/L (ref 3.5–5.1)
Sodium: 138 mmol/L (ref 135–145)

## 2019-12-30 LAB — CBC
HCT: 31.3 % — ABNORMAL LOW (ref 36.0–46.0)
Hemoglobin: 10.1 g/dL — ABNORMAL LOW (ref 12.0–15.0)
MCH: 30.2 pg (ref 26.0–34.0)
MCHC: 32.3 g/dL (ref 30.0–36.0)
MCV: 93.7 fL (ref 80.0–100.0)
Platelets: 210 10*3/uL (ref 150–400)
RBC: 3.34 MIL/uL — ABNORMAL LOW (ref 3.87–5.11)
RDW: 14.6 % (ref 11.5–15.5)
WBC: 6.4 10*3/uL (ref 4.0–10.5)
nRBC: 0 % (ref 0.0–0.2)

## 2019-12-30 LAB — GLUCOSE, CAPILLARY
Glucose-Capillary: 84 mg/dL (ref 70–99)
Glucose-Capillary: 97 mg/dL (ref 70–99)

## 2019-12-30 LAB — SARS CORONAVIRUS 2 BY RT PCR (HOSPITAL ORDER, PERFORMED IN ~~LOC~~ HOSPITAL LAB): SARS Coronavirus 2: NEGATIVE

## 2019-12-30 LAB — MRSA PCR SCREENING: MRSA by PCR: POSITIVE — AB

## 2019-12-30 MED ORDER — LEVETIRACETAM 750 MG PO TABS
750.0000 mg | ORAL_TABLET | Freq: Two times a day (BID) | ORAL | Status: DC
Start: 2019-12-30 — End: 2021-10-26

## 2019-12-30 MED ORDER — ENOXAPARIN SODIUM 40 MG/0.4ML ~~LOC~~ SOLN: 40.0000 mg | SUBCUTANEOUS | Status: DC

## 2019-12-30 NOTE — TOC Transition Note (Signed)
Transition of Care [facility]) - CM/SW Discharge Note   Patient Details  Name: Katherine [name] MRN: [id number] Date of Birth: [birth year]  Transition of Care [facility]) CM/SW Contact:  Kerin Salen, RN Phone Number: 12/30/2019, 11:58 AM   Clinical Narrative:   Patient to be discharged to [facility] today via [facility]. Spoke with Kenney Houseman patient will return back to room 88. Will forward discharge orders. No further TOC barriers assessed.    Final next level of care: [place] ([place]) Barriers to Discharge: Barriers Resolved   Patient Goals and CMS Choice Patient states their goals for this hospitalization and ongoing recovery are:: Ready to return to [facility]   Choice offered to / list presented to : NA  Discharge Placement              Patient chooses bed at: [facility] Patient to be transferred to facility by: [facility] Name of family member notified: [name] Patient and family notified of of transfer: 12/30/19  Discharge Plan and Services     Post Acute Care Choice: [place]                               Social Determinants of Health (SDOH) Interventions     Readmission Risk Interventions Readmission Risk Prevention Plan 12/29/2019 09/26/2019 11/26/2018  Transportation Screening Complete Complete Complete  Medication Review Press photographer) Complete Complete Complete  PCP or Specialist appointment within 3-5 days of discharge Not Complete - (No Data)  PCP/Specialist Appt Not Complete comments Will return to SNF - -  [place] or Home Care Consult Complete - (No Data)  SW Recovery Care/Counseling Consult Complete Complete Complete  SW Consult Not Complete Comments - - -  Palliative Care Screening Not Applicable Not Applicable -  Medication Reconcilation ([place] Complete Not Applicable Complete  Some recent data might be hidden

## 2019-12-30 NOTE — Discharge Summary (Signed)
Katherine [name] LOV:[id number] DOB: [birth year] DOA: 12/28/2019  PCP: [facility] date: 12/28/2019 Discharge date: 12/30/2019  Time spent: 35 minutes  Recommendations for Outpatient Follow-up:  1. Will need PCP f/u 2. O2 goal is 88-92%, not higher    Discharge Diagnoses:  Principal Problem:   Respiratory failure, acute-on-chronic ([place]) Active Problems:   Morbid obesity (HCC)   Chronic diastolic heart failure (HCC)   COPD with acute exacerbation (H[facility])   Acute encephalopathy   Stroke [facility])   Type 2 diabetes mellitus with stage 4 chronic kidney disease ([place])   Discharge Condition: fair  Diet recommendation: low sodium heart healthy  Filed Weights   12/28/19 0842 12/29/19 0340 12/30/19 0315  Weight: 102.1 kg 94.4 kg 97.7 kg    History of present illness:  Katherine Jonesis a 79 y.o.femalewith medical history significant forDM with complications of stage IV chronic kidney disease, hypertension, history of CVA, anxiety and depression, chronic diastolic dysfunction CHF,COPD with chronic respiratory failure to the emergency room from [facility] for evaluation of mental status changes. At baseline patient is usually awake, alert and oriented to person place and time but per nursing home she was not at her baseline. Patient with complaints of severe frontal headache. During my assessment patient is lethargic but arouses to verbal stimuli and is oriented to person and place. She was treated for urinary tract infection and she complains of a headache but she denies having any chest pain, no shortness of breath, no fever, no chills, no cough, no abdominal pain or any changes in her bowel habits. Venous blood gas shows7.26/72/39/32/64.3 Respiratory viral panel is negative Patient has significant pyuria Sodium 138, potassium 5.2, chloride 102, bicarb 29, glucose 84, BUN 31, creatinine 1.94, calcium 9.8, alkaline phosphatase 111, albumin 2.9, AST 24, ALT 14,  total protein 7.9, lactic acid 1.2, white count 4.8, hemoglobin 10.4, hematocrit 32.9, MCV 95.4, RDW 14.6, platelet count 175, PT 12.3, INR 1.0 CT scan of the head without contrast shows no acute intracranial abnormality. Atrophy with chronic small vessel white matter ischemic disease. Right mastoid effusion, stable. Chest x-ray reviewed by me shows streaky opacities in bilateral lower lobes which may represent scarring versus atelectasis. Twelve-lead EKG reviewed by me shows sinus rhythm with a left anterior fascicular block    ED Course:Patient is a 79 year old female who resides in a skilled nursing facility and who was sent to the ER for evaluation of mental status changes. She is noted to have hypercapnic respiratory failure and she has pyuria. She will be admitted to the hospital for further evaluation.  Hospital Course:  # Acute encephalopathy - possibly hypercarbic. Possibly infections from urinary source. Has had several hospitalizations for such. Also carries hx of seizure disorder. Returned to baseline by hospital day 1.   # Acute on chronic respiratory failure  On 2 L [place] at baseline. With hypercarbia greater than baseline, now resolved. Excessive oxygenation may have contributed. Per nursing had returned to baseline yesterday afternoon. Has been weaned back to home 2L  # COPD  Initially treated for exacerbation, do not think exacerbation  # Headache - resolved, CT unremarkable, no neck pain or stiffness, no fever, WBC not elevated  # Seizure disorder - Continued Keppra and lacosamide - relatively low suspicion of post-ictal state given rapid improvement  # Bacteriuria Hx of ESBL e coli in urine, unable to get hx from patient today and no report of uti symptoms. Think this likely a contaminant but prudent to treat, given meropenem  initially, then a single dose of fosfomycin.  # Diabetes mellitus A1c wnl  # Acute kidney injury on stage 3/4 ckd - resolved w/  gentle hydration  # HFpEF Here does not appear to be volume overloaded, in fact a bit dehydrated - given gentle fluids  # Hyperkalemia - mild, 5.2, likely 2/2 aki - resolved w/ hydration  Procedures:  none   Consultations:  none  Discharge Exam: Vitals:   12/30/19 0821 12/30/19 1150  BP: 133/84 (!) 148/87  Pulse: 90 77  Resp: 18 18  Temp: 98.8 F (37.1 C) 97.8 F (36.6 C)  SpO2: 97% 100%    General exam: sleeping, chronically ill appearing, obese Respiratory system: poor effort, faint rales @ bases Cardiovascular system: S1 & S2 heard, soft systolic murmur, rr Gastrointestinal system: Abdomen is nondistended, obese, soft and nontender. No organomegaly or masses felt. Normal bowel sounds heard. Central nervous system: somnolent, arousable somewhat. Moving all 4 extremities. No nuchal rigidity Extremities: Symmetric 5 x 5 power. Skin: No rashes, trace LE edema Psychiatry: aaox3  Discharge Instructions   Discharge Instructions    Diet - low sodium heart healthy   Complete by: As directed    Increase activity slowly   Complete by: As directed      Allergies as of 12/30/2019      Reactions   Bee Venom Swelling   Enalapril Maleate Swelling      Medication List    STOP taking these medications   sulfamethoxazole-trimethoprim 800-160 MG tablet Commonly known as: BACTRIM DS     TAKE these medications   acetaminophen 650 MG CR tablet Commonly known as: TYLENOL Take 650 mg by mouth 3 (three) times daily.   albuterol 0.63 MG/3ML nebulizer solution Commonly known as: ACCUNEB Take 3 mLs by nebulization every 8 (eight) hours as needed for wheezing.   ascorbic acid 500 MG tablet Commonly known as: VITAMIN C Take 1 tablet (500 mg total) by mouth daily.   atorvastatin 80 MG tablet Commonly known as: LIPITOR Take 80 mg by mouth every evening.   Biofreeze 4 % Gel Generic drug: Menthol (Topical Analgesic) Apply 1 application topically in the morning and at  bedtime. To both knees   cetirizine 10 MG tablet Commonly known as: ZYRTEC Take 10 mg by mouth daily.   docusate sodium 100 MG capsule Commonly known as: COLACE Take 100 mg by mouth 2 (two) times daily.   ferrous sulfate 325 (65 FE) MG tablet Take 325 mg by mouth 2 (two) times daily.   Fluticasone-Umeclidin-Vilant 100-62.5-25 MCG/INH Aepb Inhale 1 puff into the lungs daily.   gabapentin 300 MG capsule Commonly known as: NEURONTIN Take 300 mg by mouth at bedtime.   ipratropium-albuterol 0.5-2.5 (3) MG/3ML Soln Commonly known as: DUONEB Take 3 mLs by nebulization 3 (three) times daily.   Lacosamide 100 MG Tabs Take 1 tablet (100 mg total) by mouth 2 (two) times daily.   levETIRAcetam 750 MG tablet Commonly known as: KEPPRA Take 1 tablet (750 mg total) by mouth 2 (two) times daily. What changed:   how much to take  when to take this   linagliptin 5 MG Tabs tablet Commonly known as: TRADJENTA Take 5 mg by mouth daily.   LORazepam 2 MG/ML injection Commonly known as: ATIVAN Inject 0.5 mg into the vein daily as needed for seizure.   mupirocin ointment 2 % Commonly known as: BACTROBAN Place 1 application into the nose 2 (two) times daily.   nitroGLYCERIN 0.4 MG SL tablet  Commonly known as: NITROSTAT Place 0.4 mg under the tongue every 5 (five) minutes as needed for chest pain.   pantoprazole 40 MG tablet Commonly known as: PROTONIX Take 1 tablet (40 mg total) by mouth daily.   Probiotic 250 MG Caps Take 1 capsule by mouth 2 times daily at 12 noon and 4 pm. What changed: when to take this   Saline Gel Place 3 sprays into both nostrils 3 (three) times daily.   zinc sulfate 220 (50 Zn) MG capsule Take 1 capsule (220 mg total) by mouth daily.      Allergies  Allergen Reactions  . Bee Venom Swelling  . Enalapril Maleate Swelling      The results of significant diagnostics from this hospitalization (including imaging, microbiology, ancillary and  laboratory) are listed below for reference.    Significant Diagnostic Studies: CT Head Wo Contrast  Result Date: 12/28/2019 CLINICAL DATA:  Mental status change. EXAM: CT HEAD WITHOUT CONTRAST TECHNIQUE: Contiguous axial images were obtained from the base of the skull through the vertex without intravenous contrast. COMPARISON:  03/02/2019 FINDINGS: Brain: There is no evidence for acute hemorrhage, hydrocephalus, mass lesion, or abnormal extra-axial fluid collection. No definite CT evidence for acute infarction. Diffuse loss of parenchymal volume is consistent with atrophy. Patchy low attenuation in the deep hemispheric and periventricular white matter is nonspecific, but likely reflects chronic microvascular ischemic demyelination. Vascular: No hyperdense vessel or unexpected calcification. Skull: No evidence for fracture. No worrisome lytic or sclerotic lesion. Sinuses/Orbits: Right mastoid effusion again noted. Visualized paranasal sinuses are clear. Visualized portions of the globes and intraorbital fat are unremarkable. Other: None. IMPRESSION: 1. No acute intracranial abnormality. 2. Atrophy with chronic small vessel white matter ischemic disease. 3. Right mastoid effusion, stable. Electronically Signed   By: Misty Stanley M.D.   On: 12/28/2019 10:47   DG Chest Portable 1 View  Result Date: 12/28/2019 CLINICAL DATA:  Altered mental status. EXAM: PORTABLE CHEST 1 VIEW COMPARISON:  September 25, 2019 FINDINGS: Cardiomediastinal silhouette is normal. Mediastinal contours appear intact. Calcific atherosclerotic disease and tortuosity of the aorta. There is no evidence of focal airspace consolidation, pleural effusion or pneumothorax. Streaky opacities in bilateral lower lobes may represent scarring versus atelectasis. Osseous structures are without acute abnormality. Soft tissues are grossly normal. IMPRESSION: Streaky opacities in bilateral lower lobes may represent scarring versus atelectasis. No acute  findings. Electronically Signed   By: Fidela Salisbury M.D.   On: 12/28/2019 09:37    Microbiology: Recent Results (from the past 240 hour(s))  Blood culture (routine single)     Status: None (Preliminary result)   Collection Time: 12/28/19  9:23 AM   Specimen: BLOOD  Result Value Ref Range Status   Specimen Description BLOOD RAC  Final   Special Requests   Final    BOTTLES DRAWN AEROBIC AND ANAEROBIC Blood Culture adequate volume   Culture   Final    NO GROWTH 2 DAYS Performed at [facility], [address], [place], [place] [address]    Report Status PENDING  Incomplete  Urine culture     Status: Abnormal   Collection Time: 12/28/19 11:22 AM   Specimen: Urine, Random  Result Value Ref Range Status   Specimen Description   Final    URINE, RANDOM Performed at [facility], [address] [place], [place] [address]    Special Requests   Final    NONE Performed at [facility], [address], [place], [place] [address]  Culture MULTIPLE SPECIES PRESENT, SUGGEST RECOLLECTION (A)  Final   Report Status 12/29/2019 FINAL  Final  Respiratory Panel by RT PCR (Flu A&B, Covid) - Nasopharyngeal Swab     Status: None   Collection Time: 12/28/19 12:25 PM   Specimen: Nasopharyngeal Swab  Result Value Ref Range Status   SARS Coronavirus 2 by RT PCR NEGATIVE NEGATIVE Final    Comment: (NOTE) SARS-CoV-2 target nucleic acids are NOT DETECTED.  The SARS-CoV-2 RNA is generally detectable in upper respiratoy specimens during the acute phase of infection. The lowest concentration of SARS-CoV-2 viral copies this assay can detect is 131 copies/mL. A negative result does not preclude SARS-Cov-2 infection and should not be used as the sole basis for treatment or other patient management decisions. A negative result may occur with  improper specimen collection/handling, submission of specimen other than nasopharyngeal swab, presence of viral mutation(s) within  the areas targeted by this assay, and inadequate number of viral copies (<131 copies/mL). A negative result must be combined with clinical observations, patient history, and epidemiological information. The expected result is Negative.  Fact Sheet for Patients:  PinkCheek.be  Fact Sheet for Healthcare Providers:  GravelBags.it  This test is no t yet approved or cleared by the [place] FDA and  has been authorized for detection and/or diagnosis of SARS-CoV-2 by FDA under an Emergency Use Authorization (EUA). This EUA will remain  in effect (meaning this test can be used) for the duration of the COVID-19 declaration under Section 564(b)(1) of the Act, 21 U.S.C. section 360bbb-3(b)(1), unless the authorization is terminated or revoked sooner.     Influenza A by PCR NEGATIVE NEGATIVE Final   Influenza B by PCR NEGATIVE NEGATIVE Final    Comment: (NOTE) The Xpert Xpress SARS-CoV-2/FLU/RSV assay is intended as an aid in  the diagnosis of influenza from Nasopharyngeal swab specimens and  should not be used as a sole basis for treatment. Nasal washings and  aspirates are unacceptable for Xpert Xpress SARS-CoV-2/FLU/RSV  testing.  Fact Sheet for Patients: PinkCheek.be  Fact Sheet for Healthcare Providers: GravelBags.it  This test is not yet approved or cleared by the [place] FDA and  has been authorized for detection and/or diagnosis of SARS-CoV-2 by  FDA under an Emergency Use Authorization (EUA). This EUA will remain  in effect (meaning this test can be used) for the duration of the  Covid-19 declaration under Section 564(b)(1) of the Act, 21  U.S.C. section 360bbb-3(b)(1), unless the authorization is  terminated or revoked. Performed at [facility], [address] [place],  [address]   SARS Coronavirus 2 by RT PCR (hospital order,  performed in [facility] hospital lab) Nasopharyngeal Nasopharyngeal Swab     Status: None   Collection Time: 12/30/19 10:10 AM   Specimen: Nasopharyngeal Swab  Result Value Ref Range Status   SARS Coronavirus 2 NEGATIVE NEGATIVE Final    Comment: (NOTE) SARS-CoV-2 target nucleic acids are NOT DETECTED.  The SARS-CoV-2 RNA is generally detectable in upper and lower respiratory specimens during the acute phase of infection. The lowest concentration of SARS-CoV-2 viral copies this assay can detect is 250 copies / mL. A negative result does not preclude SARS-CoV-2 infection and should not be used as the sole basis for treatment or other patient management decisions.  A negative result may occur with improper specimen collection / handling, submission of specimen other than nasopharyngeal swab, presence of viral mutation(s) within the areas targeted by this assay, and inadequate number of  viral copies (<250 copies / mL). A negative result must be combined with clinical observations, patient history, and epidemiological information.  Fact Sheet for Patients:   StrictlyIdeas.no  Fact Sheet for Healthcare Providers: BankingDealers.co.za  This test is not yet approved or  cleared by the [place] FDA and has been authorized for detection and/or diagnosis of SARS-CoV-2 by FDA under an Emergency Use Authorization (EUA).  This EUA will remain in effect (meaning this test can be used) for the duration of the COVID-19 declaration under Section 564(b)(1) of the Act, 21 U.S.C. section 360bbb-3(b)(1), unless the authorization is terminated or revoked sooner.  Performed at [facility], [address] [place], [place] [address]      Labs: Basic Metabolic Panel: Recent Labs  Lab 12/28/19 0902 12/29/19 0426 12/30/19 0528  NA 138 136 138  K 5.2* 5.2* 4.7  CL 102 103 108  CO2 29 23 25   GLUCOSE 84 167* 103*  BUN 31* 45* 44*   CREATININE 1.94* 1.97* 1.64*  CALCIUM 9.8 9.5 8.7*   Liver Function Tests: Recent Labs  Lab 12/28/19 0902  AST 24  ALT 14  ALKPHOS 111  BILITOT 0.5  PROT 7.9  ALBUMIN 2.9*   No results for input(s): LIPASE, AMYLASE in the last 168 hours. No results for input(s): AMMONIA in the last 168 hours. CBC: Recent Labs  Lab 12/28/19 0902 12/29/19 0426 12/30/19 0528  WBC 4.8 4.8 6.4  NEUTROABS 2.5  --   --   HGB 10.4* 10.6* 10.1*  HCT 32.9* 33.0* 31.3*  MCV 95.4 92.4 93.7  PLT 175 199 210   Cardiac Enzymes: No results for input(s): CKTOTAL, CKMB, CKMBINDEX, TROPONINI in the last 168 hours. BNP: BNP (last 3 results) Recent Labs    02/14/19 1109 06/19/19 0137 12/29/19 0426  BNP 117.0* 117.0* 63.5    ProBNP (last 3 results) No results for input(s): PROBNP in the last 8760 hours.  CBG: Recent Labs  Lab 12/29/19 1133 12/29/19 1710 12/29/19 2045 12/30/19 0821 12/30/19 1151  GLUCAP 98 170* 171* 84 97       Signed:  Desma Maxim MD.  [facility] 12/30/2019, 12:15 PM

## 2019-12-30 NOTE — Progress Notes (Signed)
T/C to [employer]. Report given to [facility].

## 2019-12-30 NOTE — Progress Notes (Signed)
PHARMACIST - PHYSICIAN COMMUNICATION  CONCERNING:  Enoxaparin (Lovenox) for DVT Prophylaxis    RECOMMENDATION: Patient was prescribed enoxaparin 30 mg q24 hours for VTE prophylaxis.   Filed Weights   12/28/19 0842 12/29/19 0340 12/30/19 0315  Weight: 102.1 kg (225 lb) 94.4 kg (208 lb 1.8 oz) 97.7 kg (215 lb 6.2 oz)    Body mass index is 34.76 kg/m.  Estimated Creatinine Clearance: 33.3 mL/min (A) (by C-G formula based on SCr of 1.64 mg/dL (H)).   Patient is candidate for enoxaparin 40 mg every 24 hours based on CrCl > 65ml/min or Weight  > 45kg  DESCRIPTION: Pharmacy has adjusted enoxaparin dose per [facility] policy.  Patient is now receiving enoxaparin 40 mg every 24 hours    Benita Gutter 12/30/2019 10:07 AM

## 2019-12-31 DIAGNOSIS — J449 Chronic obstructive pulmonary disease, unspecified: Secondary | ICD-10-CM | POA: Diagnosis not present

## 2019-12-31 DIAGNOSIS — J9611 Chronic respiratory failure with hypoxia: Secondary | ICD-10-CM | POA: Diagnosis not present

## 2019-12-31 DIAGNOSIS — I509 Heart failure, unspecified: Secondary | ICD-10-CM | POA: Diagnosis not present

## 2020-01-02 LAB — CULTURE, BLOOD (SINGLE)
Culture: NO GROWTH
Special Requests: ADEQUATE

## 2020-01-06 DIAGNOSIS — E1159 Type 2 diabetes mellitus with other circulatory complications: Secondary | ICD-10-CM | POA: Diagnosis not present

## 2020-01-06 DIAGNOSIS — J449 Chronic obstructive pulmonary disease, unspecified: Secondary | ICD-10-CM | POA: Diagnosis not present

## 2020-01-06 DIAGNOSIS — G9349 Other encephalopathy: Secondary | ICD-10-CM | POA: Diagnosis not present

## 2020-01-06 DIAGNOSIS — J9611 Chronic respiratory failure with hypoxia: Secondary | ICD-10-CM | POA: Diagnosis not present

## 2020-01-08 IMAGING — CT CT HEAD CODE STROKE
3 series · 15 of 46 positions shown, 18 images · non-contrast
Comparison: Head CT without contrast 07/25/2008.

ADDENDUM:
Study discussed by telephone with Dr. CHRISTINE ROSE SUA on 06/28/2017
at4444 hours.
CLINICAL DATA: Code stroke. 76-year-old female with abnormal speech
beginning 4 hours ago. Expressive aphasia.

EXAM:
CT HEAD WITHOUT CONTRAST
TECHNIQUE: Contiguous axial images were obtained from the base of the skull
through the vertex without intravenous contrast.

[Series 2: head wo · axial · 0.40mm/px · z∈[+73,+193]mm · 9 of 29 slices shown, 12 images]
[im 3/29  brain]
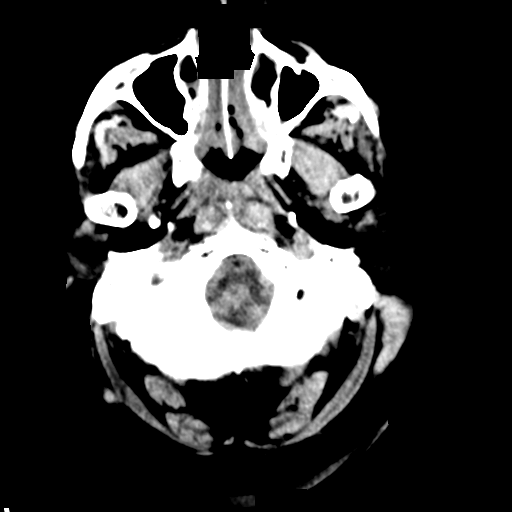
[im 3/29  bone]
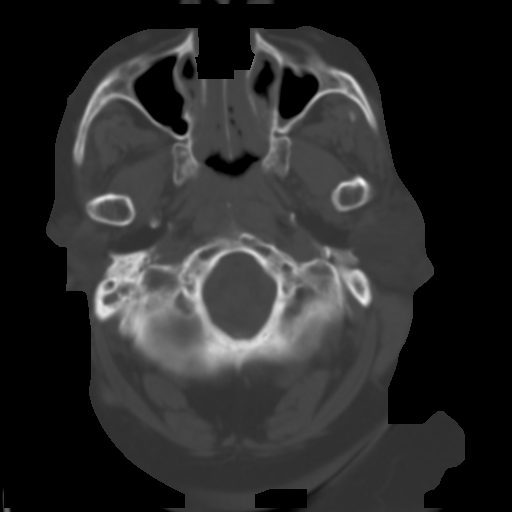
[im 6/29  brain]
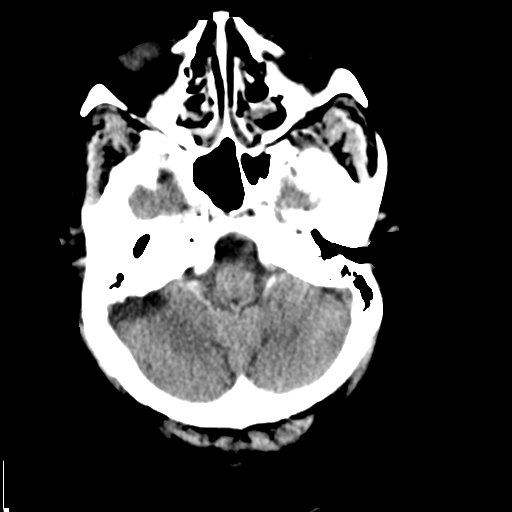
[im 9/29  brain]
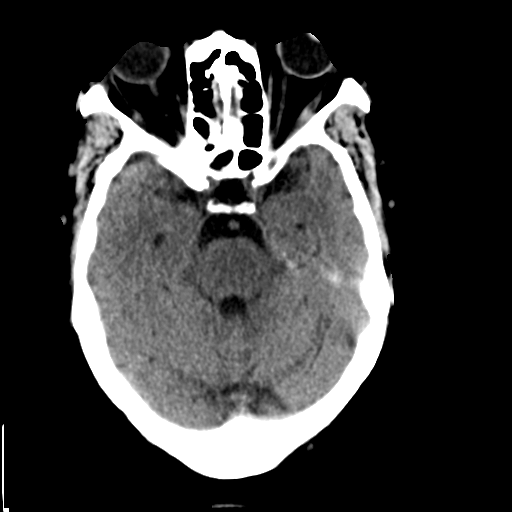
[im 12/29  brain]
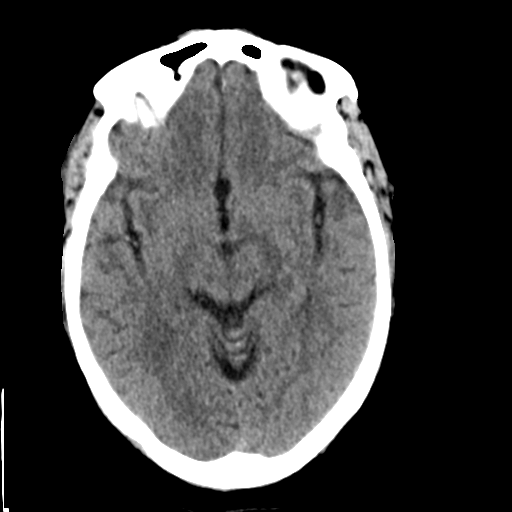
[im 15/29  brain]
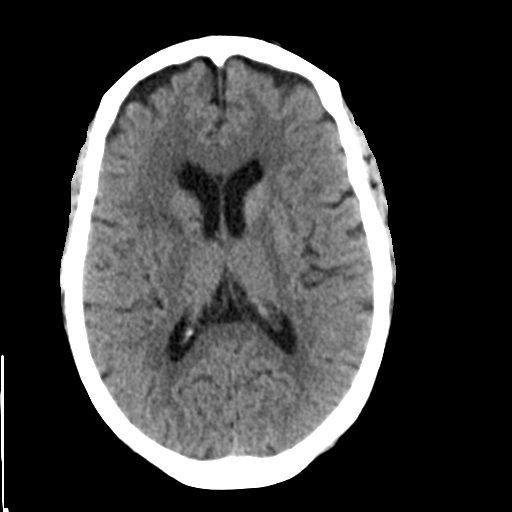
[im 15/29  bone]
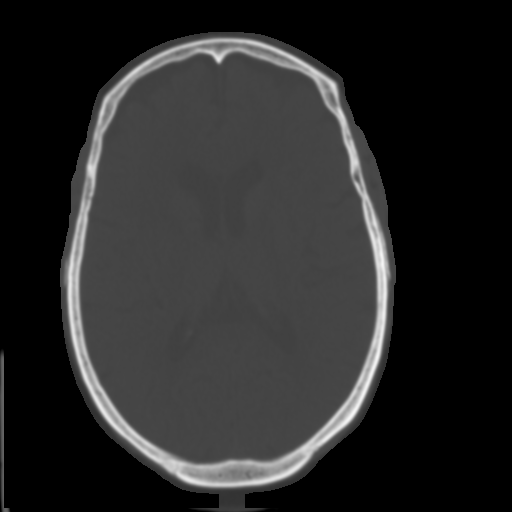
[im 18/29  brain]
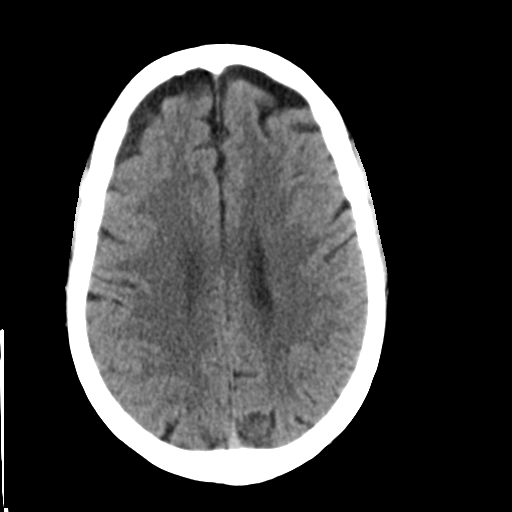
[im 21/29  brain]
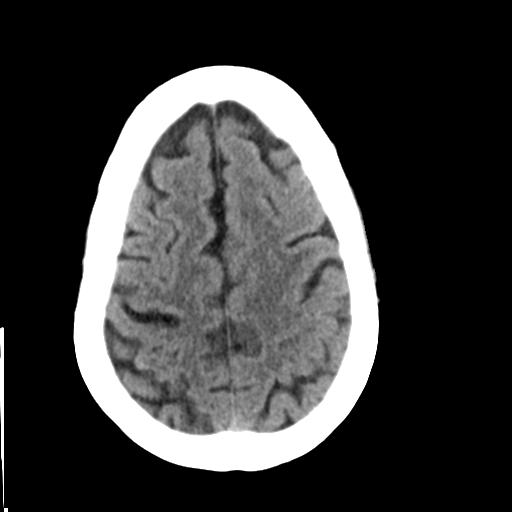
[im 24/29  brain]
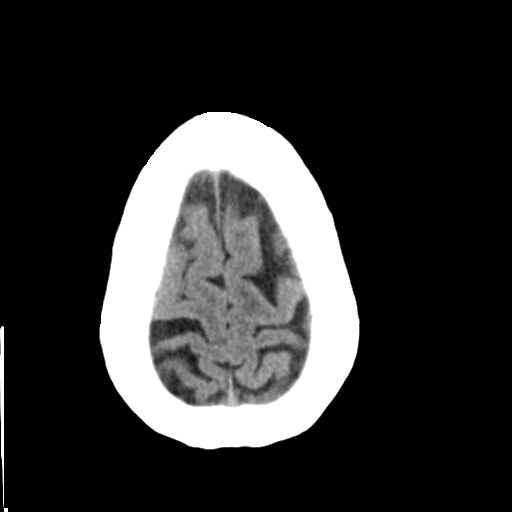
[im 27/29  brain]
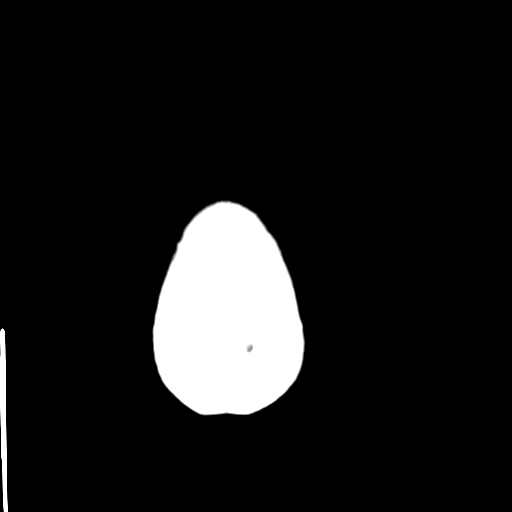
[im 27/29  bone]
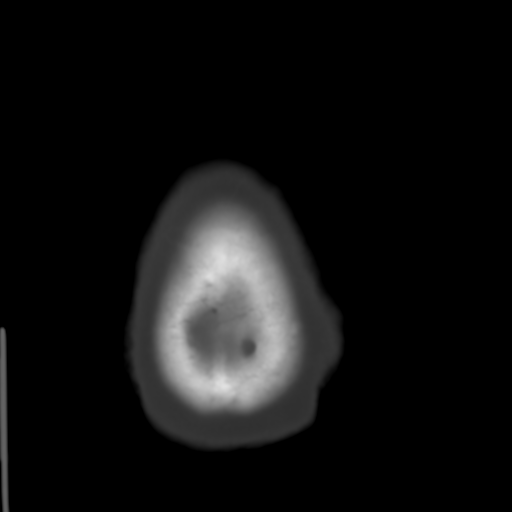

[Series 4: coronal soft tissue · coronal · 0.28mm/px · 3 of 65 slices shown]
[im 22/65  brain]
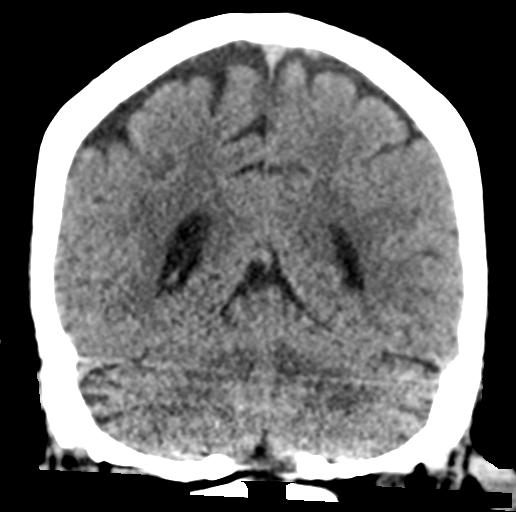
[im 29/65  brain]
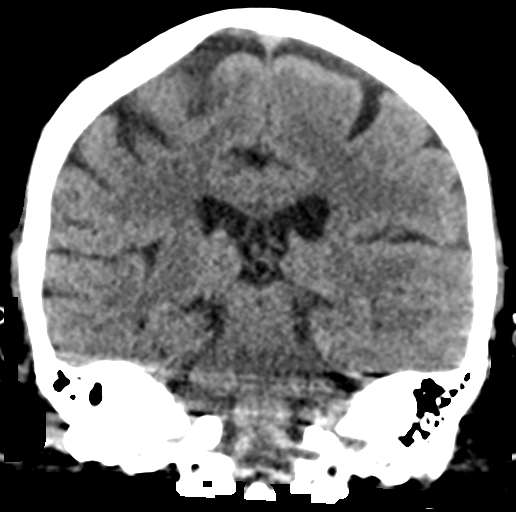
[im 36/65  brain]
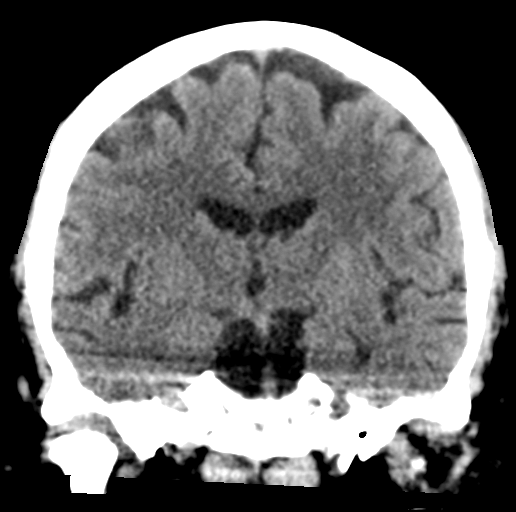

[Series 5: sagittal soft tissue · sagittal · 0.28mm/px · 3 of 48 slices shown]
[im 16/48  brain]
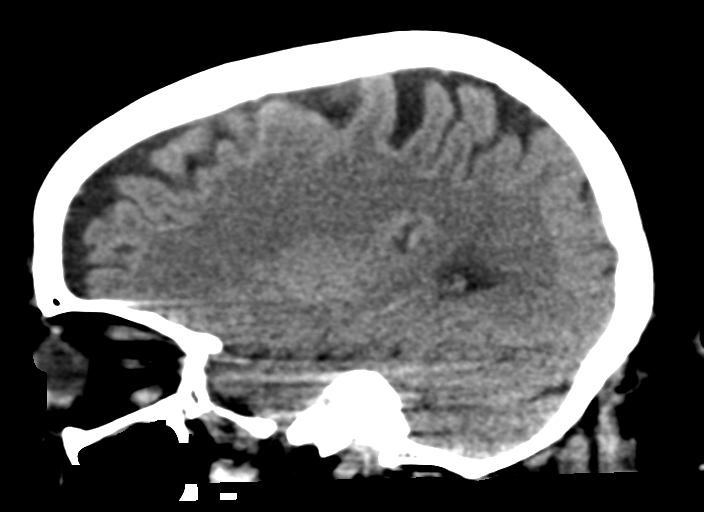
[im 24/48  brain]
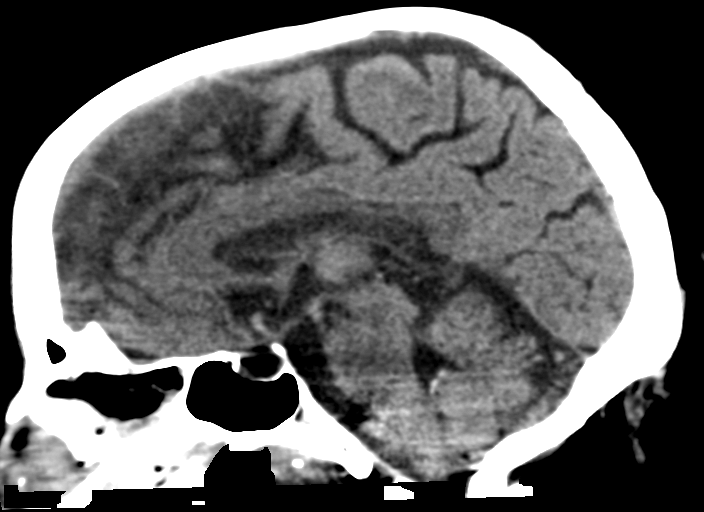
[im 32/48  brain]
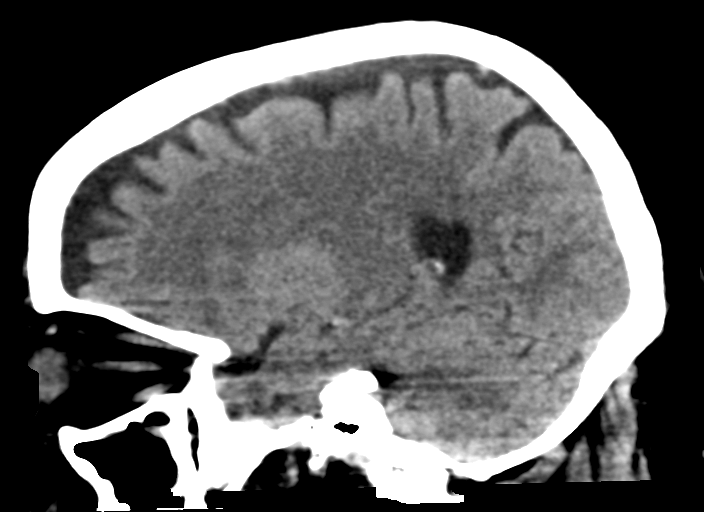

[15 of 46 positions shown; findings below may reference images not displayed]

FINDINGS: Brain: Minimal heterogeneity at the right caudate nucleus is stable
since 2575 and probably a perivascular space, but there is new mild
white matter hypodensity at the anterior limb of the right internal
capsule since 2575.. No midline shift, ventriculomegaly, mass
effect, evidence of mass lesion, intracranial hemorrhage or evidence
of cortically based acute infarction. No cortical encephalomalacia
identified.

Vascular: No suspicious intracranial vascular hyperdensity.

Skull: No acute osseous abnormality identified.

Sinuses/Orbits: Stable and well pneumatized. Bilateral nasal cavity
mucosal thickening and small volume retained secretions today. Mild
right mastoid effusion.

Other: Disconjugate gaze similar to the prior CT. No acute orbit or
scalp soft tissue findings.

ASPECTS ([place] Stroke Program Early CT Score)

- Ganglionic level infarction (caudate, lentiform nuclei, internal
capsule, insula, M1- M3 cortex): 7

- Supraganglionic infarction (M4-M6 cortex): 3

Total score (0-10 with 10 being normal): 10
IMPRESSION: 1. No acute cortically based infarct or acute intracranial
hemorrhage identified.
2. ASPECTS is 10.
3. Mild for age cerebral white matter disease has progressed since

## 2020-01-08 IMAGING — MR MR MRA HEAD W/O CM
11 series · 38 of 48 positions shown · non-contrast
Comparison: CT HEAD June 28, 2017

CLINICAL DATA: Altered mental status and slurred speech beginning
this morning. Aphasic without motor deficits. History of
hypertension.

EXAM:
MRI HEAD WITHOUT CONTRAST
MRA HEAD WITHOUT CONTRAST
TECHNIQUE: Multiplanar, multiecho pulse sequences of the brain and surrounding
structures were obtained without intravenous contrast. Angiographic
images of the head were obtained using MRA technique without
contrast.

[Series 2: GRE · sagittal · 5.0mm · 0.45mm/px · 3 of 27 slices shown (1 of 2)]
[im 1/27]
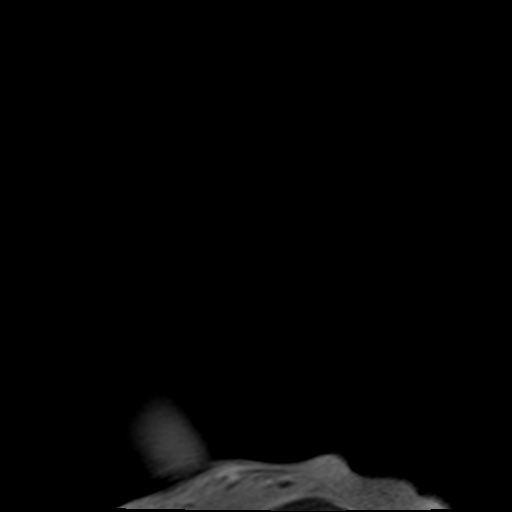
[im 14/27]
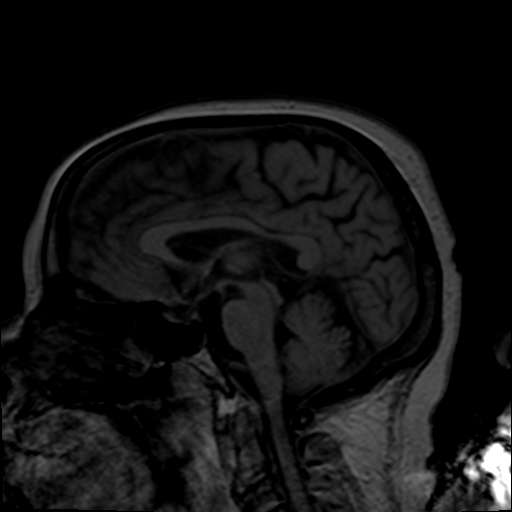
[im 27/27]
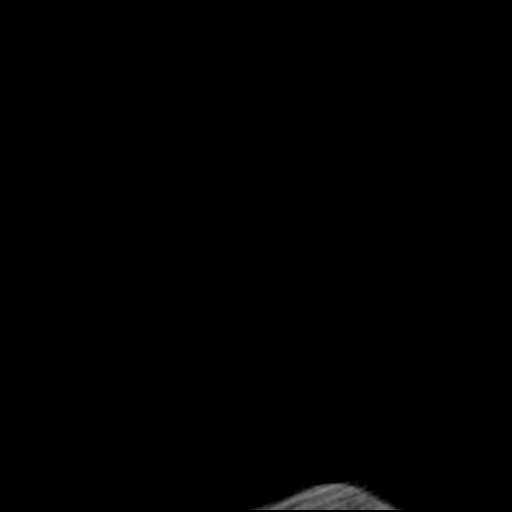

[Series 4: DWI · axial · 3.0mm · 1.80mm/px · z∈[-136,+25]mm · 6 of 55 slices shown (1 of 4)]
[im 1/55]
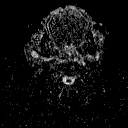
[im 11/55]
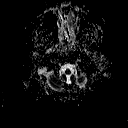
[im 22/55]
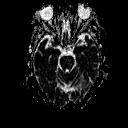
[im 33/55]
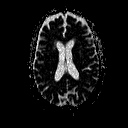
[im 44/55]
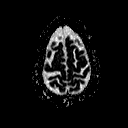
[im 55/55]
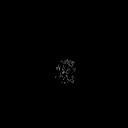

[Series 6: DWI · coronal · 3.0mm · 1.80mm/px · 4 of 49 slices shown (2 of 4)]
[im 1/49]
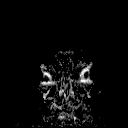
[im 17/49]
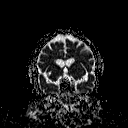
[im 33/49]
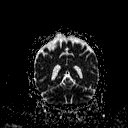
[im 49/49]
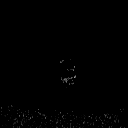

[Series 8: TOF · axial · non-contrast · 0.7mm · 0.37mm/px · z∈[-134,-102]mm · 3 of 143 slices shown]
[im 1/143]
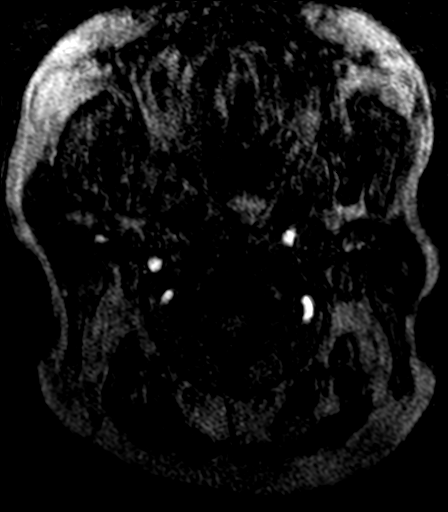
[im 24/143]
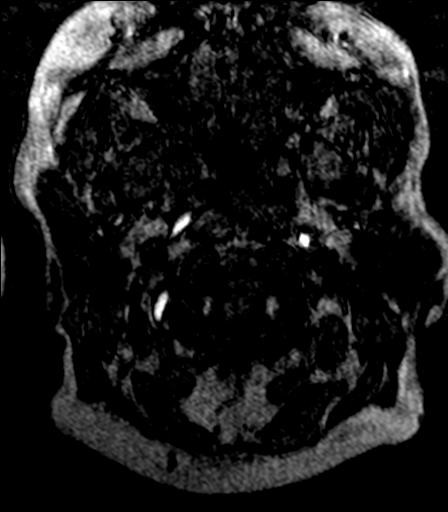
[im 48/143]
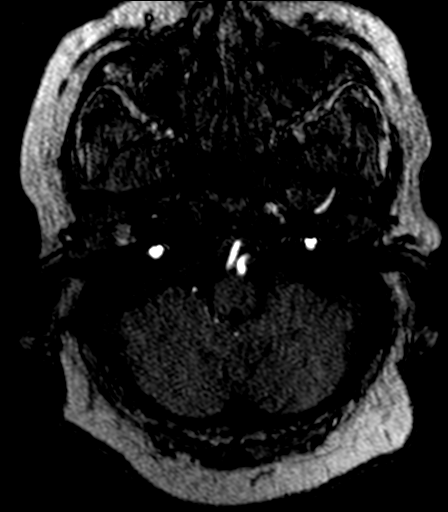

[Series 13: T2 · axial · 5.0mm · 0.45mm/px · z∈[-144,+11]mm · 2 of 25 slices shown (1 of 3)]
[im 1/25]
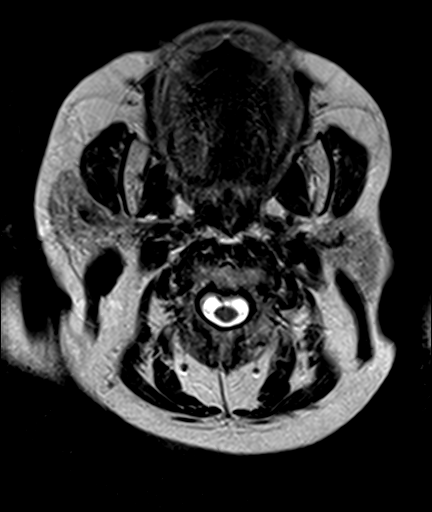
[im 25/25]
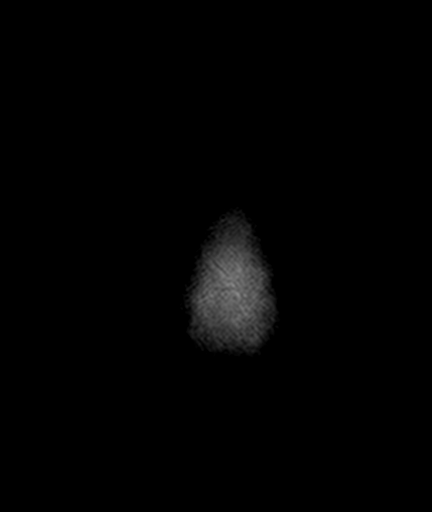

[Series 14: FLAIR · axial · 3.0mm · 0.45mm/px · z∈[-144,+11]mm · 5 of 53 slices shown]
[im 1/53]
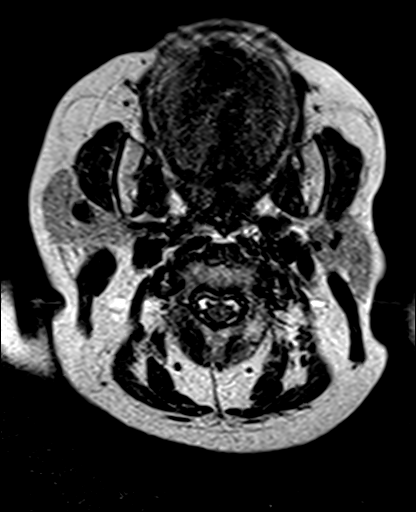
[im 14/53]
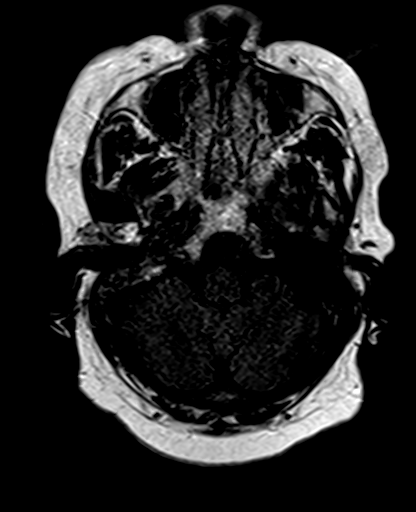
[im 27/53]
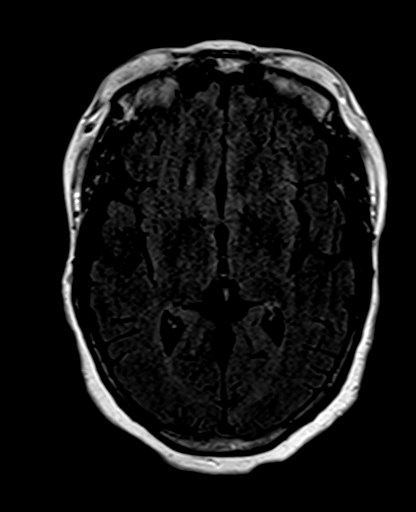
[im 40/53]
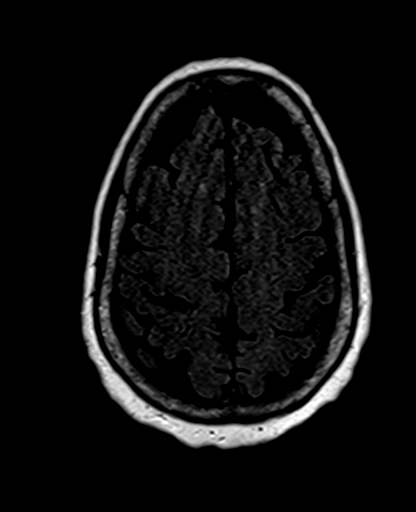
[im 53/53]
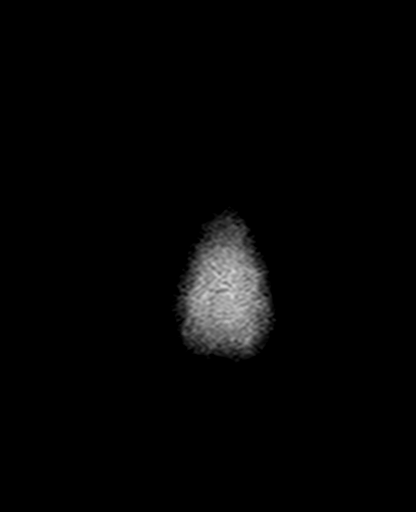

[Series 15: T2 · axial · 5.0mm · 1.20mm/px · z∈[-145,+10]mm · 2 of 25 slices shown (2 of 3)]
[im 1/25]
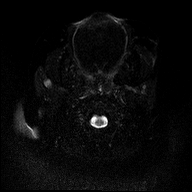
[im 25/25]
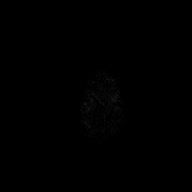

[Series 16: GRE · axial · 5.0mm · 0.45mm/px · z∈[-145,+10]mm · 2 of 25 slices shown (2 of 2)]
[im 1/25]
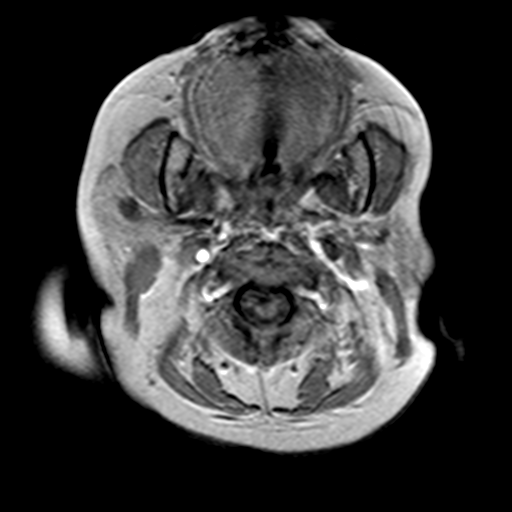
[im 25/25]
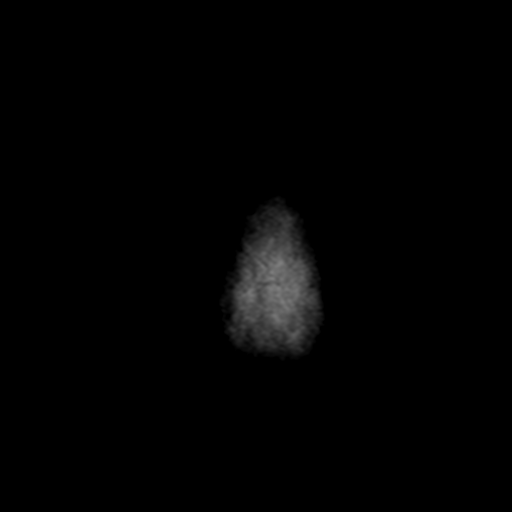

[Series 17: T2 · coronal · 5.0mm · 0.43mm/px · 2 of 27 slices shown (3 of 3)]
[im 1/27]
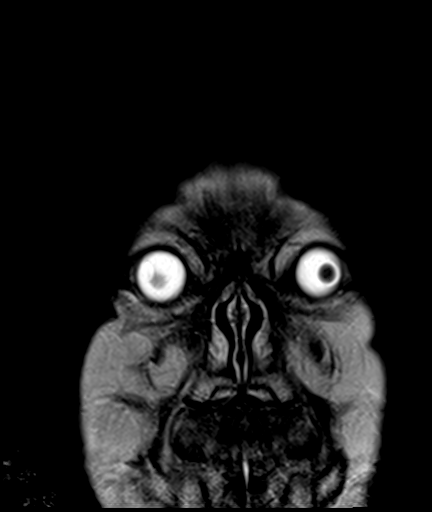
[im 27/27]
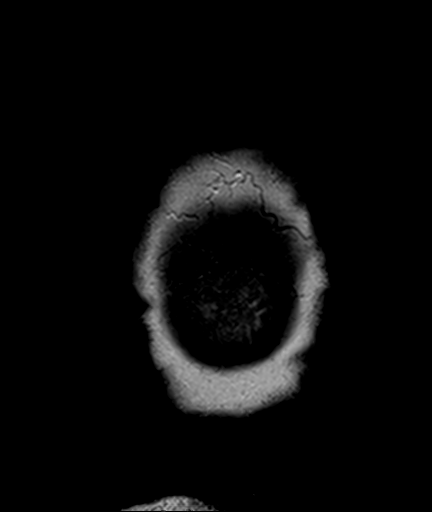

[Series 100: DWI · axial · 3.0mm · 1.80mm/px · z∈[-136,+25]mm · 5 of 55 slices shown (3 of 4)]
[im 1/55]
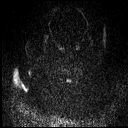
[im 14/55]
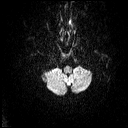
[im 28/55]
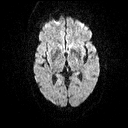
[im 41/55]
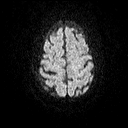
[im 55/55]
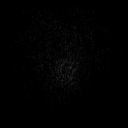

[Series 101: DWI · coronal · 3.0mm · 1.80mm/px · 4 of 49 slices shown (4 of 4)]
[im 1/49]
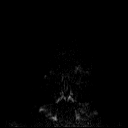
[im 17/49]
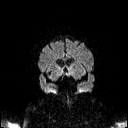
[im 33/49]
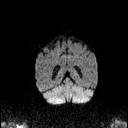
[im 49/49]
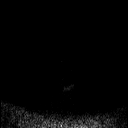

[38 of 48 positions shown; findings below may reference images not displayed]

FINDINGS: MRI HEAD FINDINGS-Multiple sequences are mildly motion degraded.

INTRACRANIAL CONTENTS: No reduced diffusion to suggest acute
ischemia. No susceptibility artifact to suggest hemorrhage. The
ventricles and sulci are normal for patient's age. Patchy
supratentorial white matter FLAIR T2 hyperintensities compatible
with mild to moderate chronic small vessel ischemic changes, normal
for age. Old RIGHT ganglia lacunar infarct. Suspicious parenchymal
signal, masses, mass effect. No abnormal extra-axial fluid
collections. No extra-axial masses.

VASCULAR: Normal major intracranial vascular flow voids present at
skull base.

SKULL AND UPPER CERVICAL SPINE: No abnormal sellar expansion. No
suspicious calvarial bone marrow signal. Craniocervical junction
maintained.

SINUSES/ORBITS: RIGHT mastoid effusion. Trace paranasal sinus
mucosal thickening.The included ocular globes and orbital contents
are non-suspicious.

OTHER: None.

MRA HEAD FINDINGS-moderately motion degraded examination.

ANTERIOR CIRCULATION: Normal flow related enhancement of the
included cervical, petrous, cavernous and supraclinoid internal
carotid arteries. Patent anterior communicating artery, 4 mm
superiorly directed A-comm aneurysm. Severe stenosis RIGHT A2 origin
versus motion artifact. Patent anterior and middle cerebral
arteries, mild luminal irregularity seen with motion artifact or
atherosclerosis.

No large vessel occlusion, flow limiting stenosis.

POSTERIOR CIRCULATION: Codominant vertebral arteries. Basilar artery
is patent, with normal flow related enhancement of the main branch
vessels. Patent posterior cerebral arteries, mild luminal
irregularity seen with motion artifact or atherosclerosis.

No large vessel occlusion, flow limiting stenosis,  aneurysm.

ANATOMIC VARIANTS: Supernumerary anterior cerebral artery arising
from LEFT A1-2 junction.

Source images and MIP images were reviewed.
IMPRESSION: MRI HEAD:

1. No acute intracranial process on this motion degraded
examination.
2. Mild-to-moderate chronic small vessel ischemic changes. Old RIGHT
basal ganglia lacunar infarct.

MRA HEAD:

1. Moderately motion degraded examination. No emergent large vessel
occlusion.
2. Severe stenosis versus motion artifact RIGHT A2 origin.
3. 4 mm A-comm aneurysm. Neuro-Interventional Radiology consultation
is suggested to evaluate the appropriateness of potential treatment.
Non-emergent evaluation can be arranged by calling [phone]
during usual hours. Emergency evaluation can be requested by paging
[phone].

## 2020-01-09 DIAGNOSIS — J9811 Atelectasis: Secondary | ICD-10-CM | POA: Diagnosis not present

## 2020-01-09 DIAGNOSIS — J9622 Acute and chronic respiratory failure with hypercapnia: Secondary | ICD-10-CM | POA: Diagnosis not present

## 2020-01-09 DIAGNOSIS — R1311 Dysphagia, oral phase: Secondary | ICD-10-CM | POA: Diagnosis not present

## 2020-01-09 DIAGNOSIS — J441 Chronic obstructive pulmonary disease with (acute) exacerbation: Secondary | ICD-10-CM | POA: Diagnosis not present

## 2020-01-14 DIAGNOSIS — F331 Major depressive disorder, recurrent, moderate: Secondary | ICD-10-CM | POA: Diagnosis not present

## 2020-01-14 DIAGNOSIS — I5032 Chronic diastolic (congestive) heart failure: Secondary | ICD-10-CM | POA: Diagnosis not present

## 2020-01-14 DIAGNOSIS — I2510 Atherosclerotic heart disease of native coronary artery without angina pectoris: Secondary | ICD-10-CM | POA: Diagnosis not present

## 2020-01-14 DIAGNOSIS — I6930 Unspecified sequelae of cerebral infarction: Secondary | ICD-10-CM | POA: Diagnosis not present

## 2020-01-14 DIAGNOSIS — G4733 Obstructive sleep apnea (adult) (pediatric): Secondary | ICD-10-CM | POA: Diagnosis not present

## 2020-01-14 DIAGNOSIS — J449 Chronic obstructive pulmonary disease, unspecified: Secondary | ICD-10-CM | POA: Diagnosis not present

## 2020-01-14 DIAGNOSIS — N184 Chronic kidney disease, stage 4 (severe): Secondary | ICD-10-CM | POA: Diagnosis not present

## 2020-01-14 DIAGNOSIS — E1122 Type 2 diabetes mellitus with diabetic chronic kidney disease: Secondary | ICD-10-CM | POA: Diagnosis not present

## 2020-01-14 DIAGNOSIS — J9621 Acute and chronic respiratory failure with hypoxia: Secondary | ICD-10-CM | POA: Diagnosis not present

## 2020-01-17 IMAGING — DX DG CHEST 1V PORT
1 series · 1 of 1 positions shown · non-contrast
Comparison: 06/13/2017.

CLINICAL DATA: Shortness of breath.  COPD.  Ex-smoker.

EXAM:
PORTABLE CHEST 1 VIEW

[chest ap]
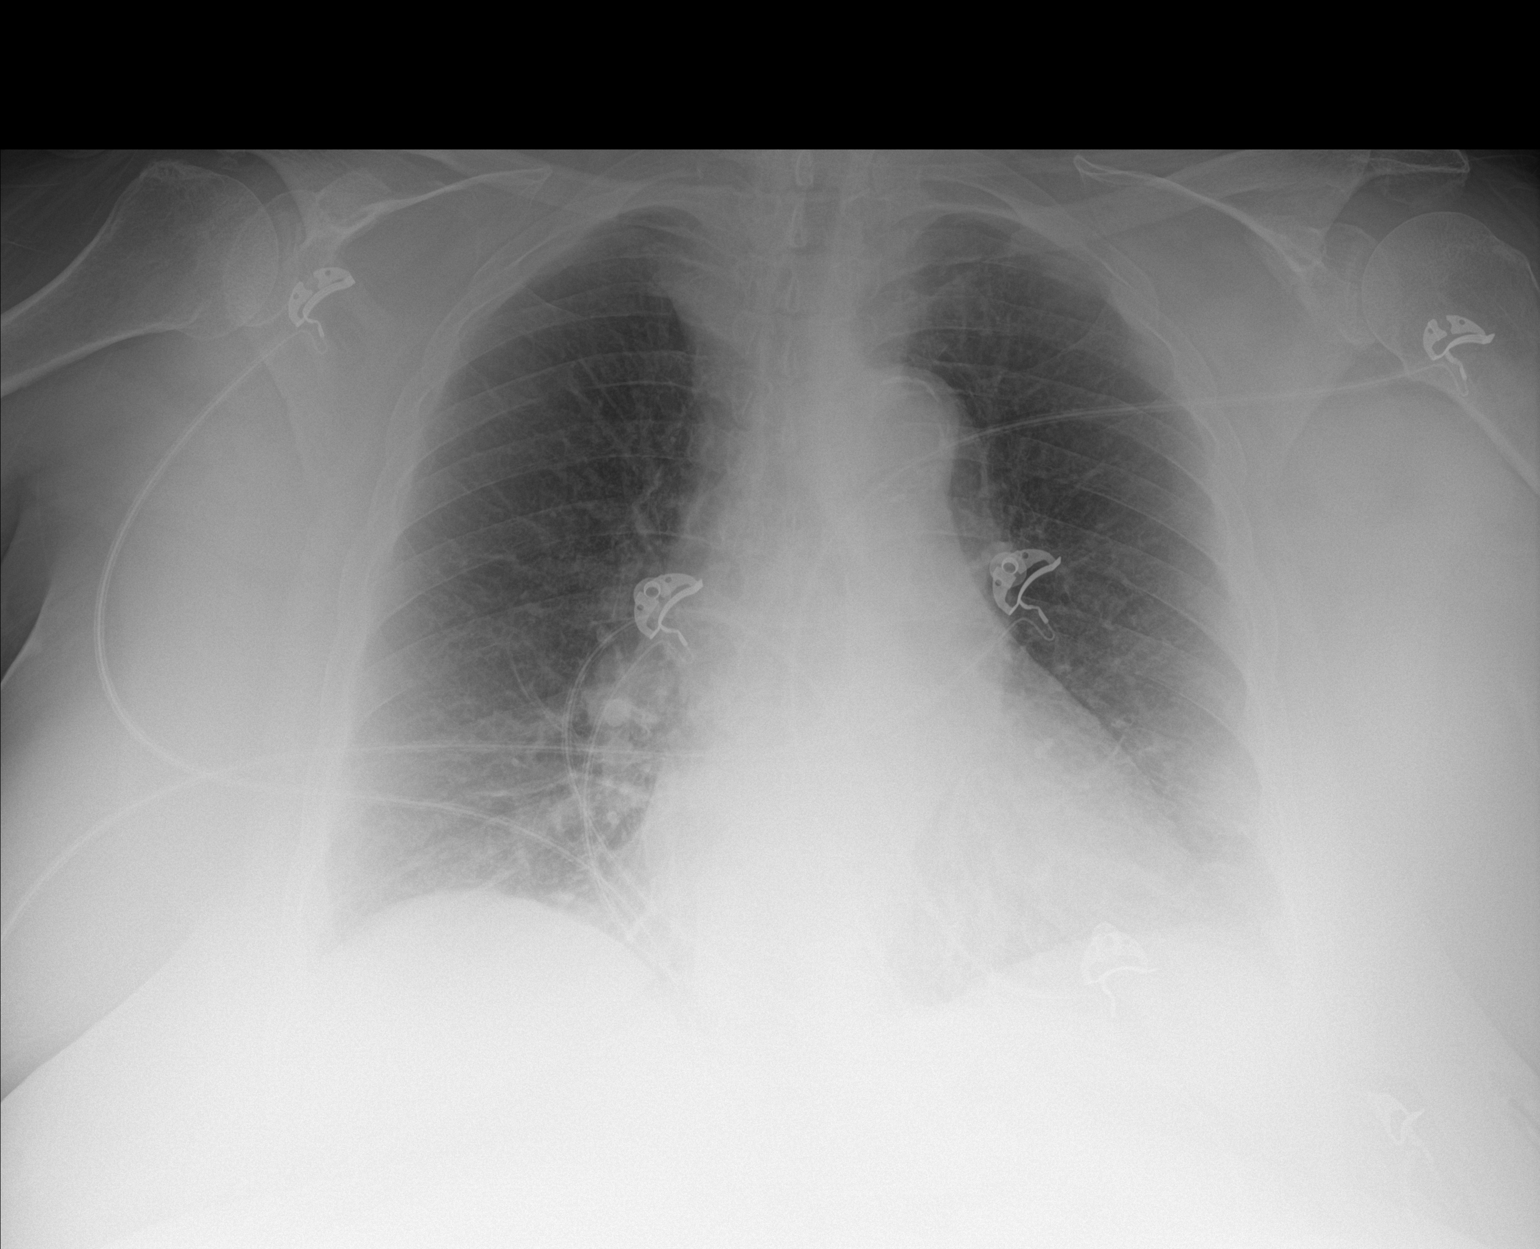

[1 of 1 positions shown; findings below may reference images not displayed]

FINDINGS: The cardiac silhouette remains borderline enlarged. Clear lungs with
normal vascularity. Tortuous and calcified thoracic aorta. No acute
bony abnormality.
IMPRESSION: No acute abnormality.

## 2020-01-17 IMAGING — CT CT CHEST W/O CM
2 of 3 series · 15 of 36 positions shown, 18 images · non-contrast
Comparison: Chest x-ray 07/07/2017, CT chest 04/12/2017, 02/12/2015

CLINICAL DATA: Labored breathing history of COPD

EXAM:
CT CHEST WITHOUT CONTRAST
TECHNIQUE: Multidetector CT imaging of the chest was performed following the
standard protocol without IV contrast.

[Series 2: thorax · axial · 0.72mm/px · z∈[-934,-688]mm · 12 of 145 slices shown, 15 images]
[im 11/145  mediastinal]
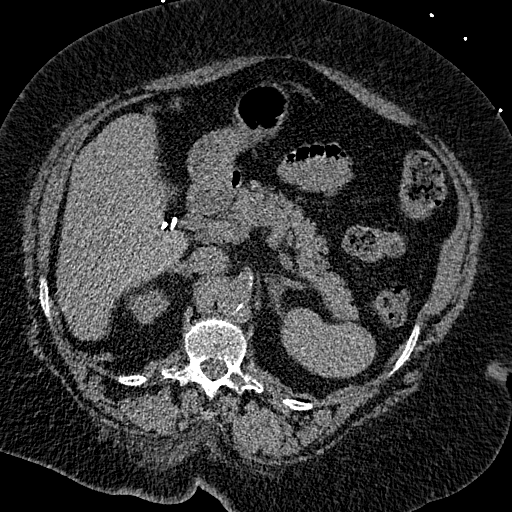
[im 11/145  lung]
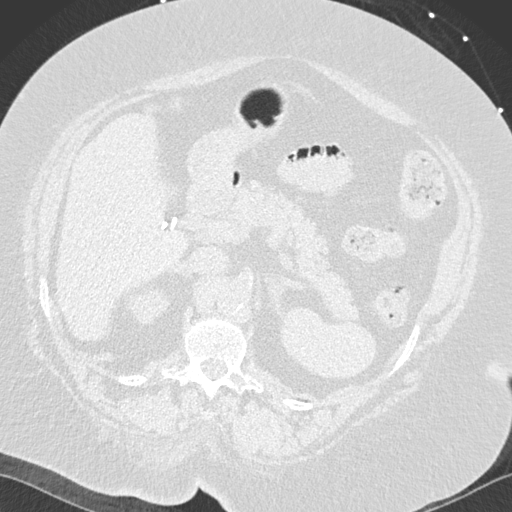
[im 22/145  lung]
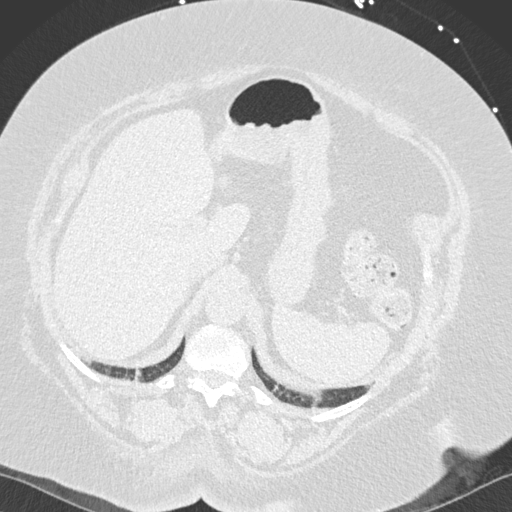
[im 33/145  lung]
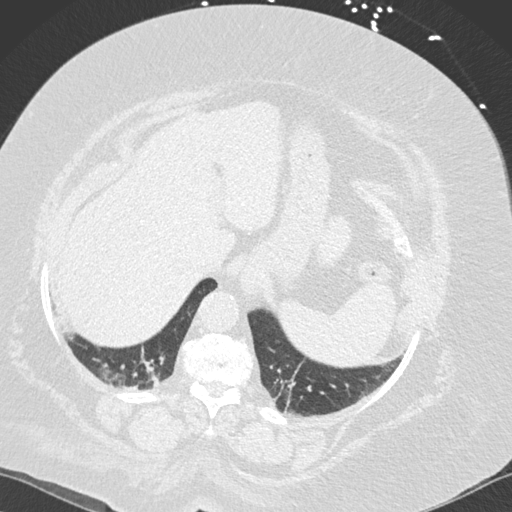
[im 43/145  lung]
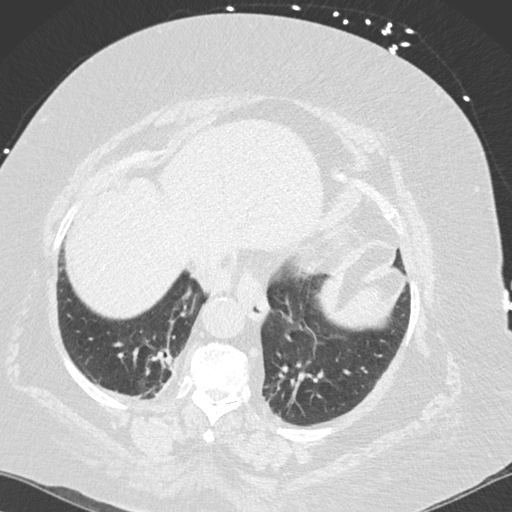
[im 54/145  mediastinal]
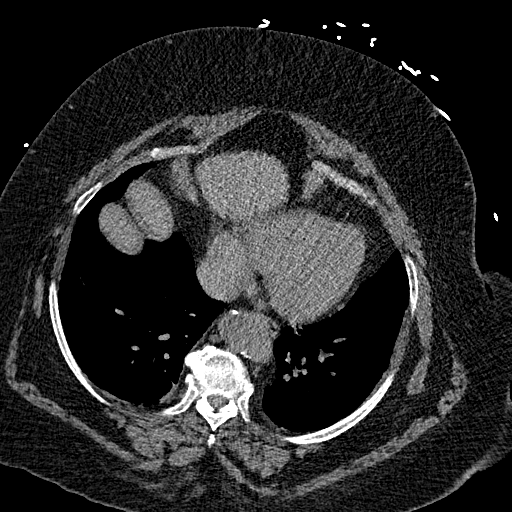
[im 54/145  lung]
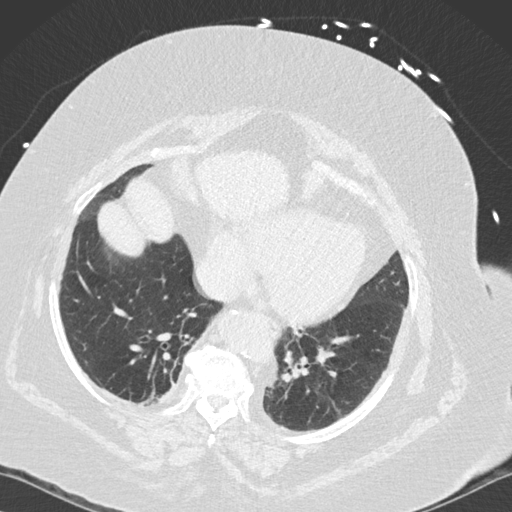
[im 65/145  lung]
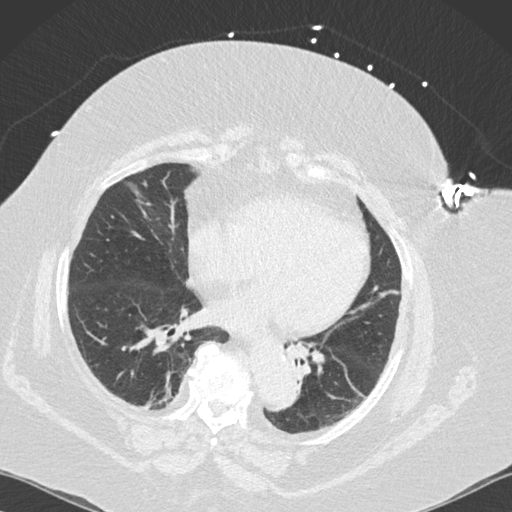
[im 81/145  lung]
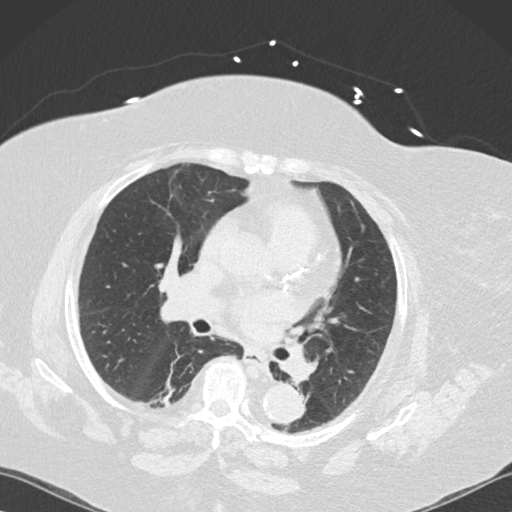
[im 91/145  lung]
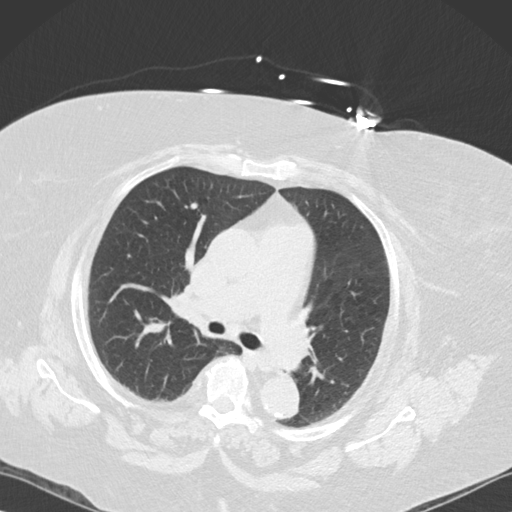
[im 102/145  mediastinal]
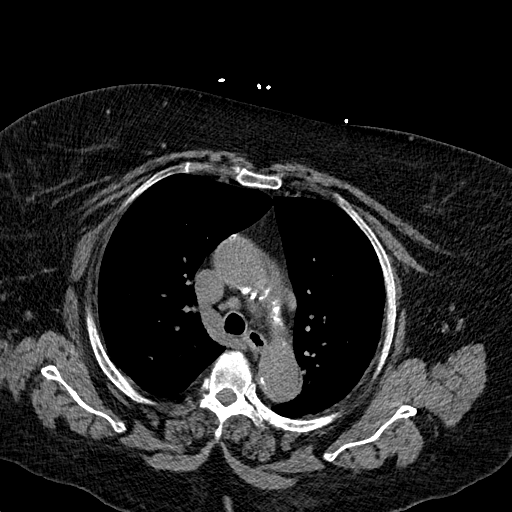
[im 102/145  lung]
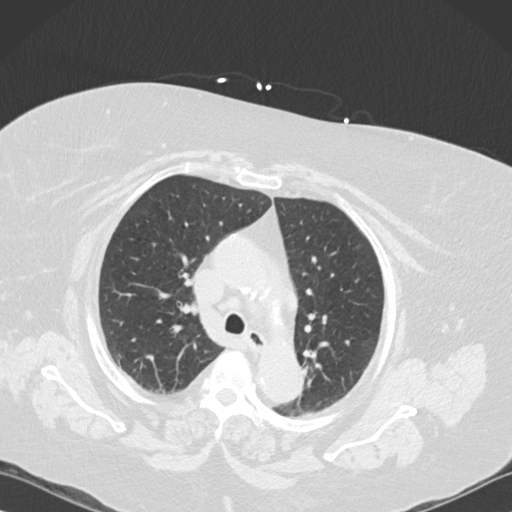
[im 113/145  lung]
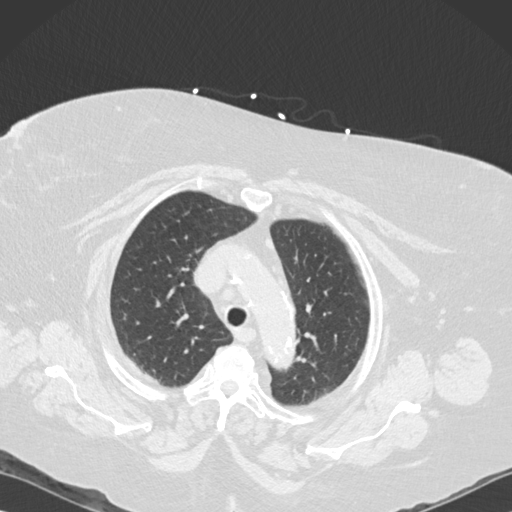
[im 123/145  lung]
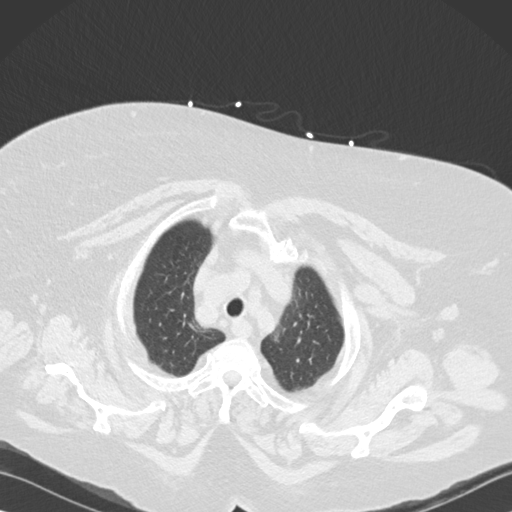
[im 134/145  lung]
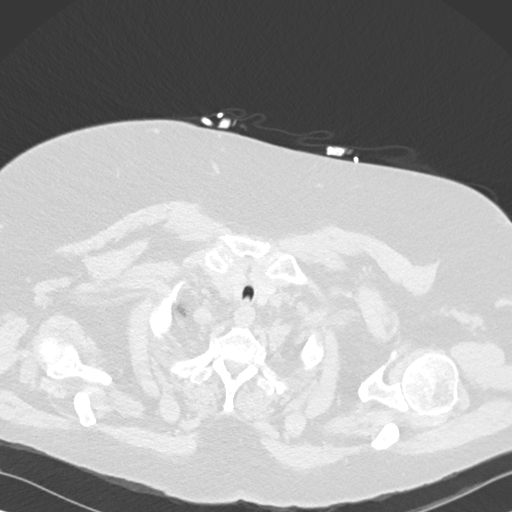

[Series 5: coronal · coronal · 0.62mm/px · 3 of 174 slices shown]
[im 35/174  lung]
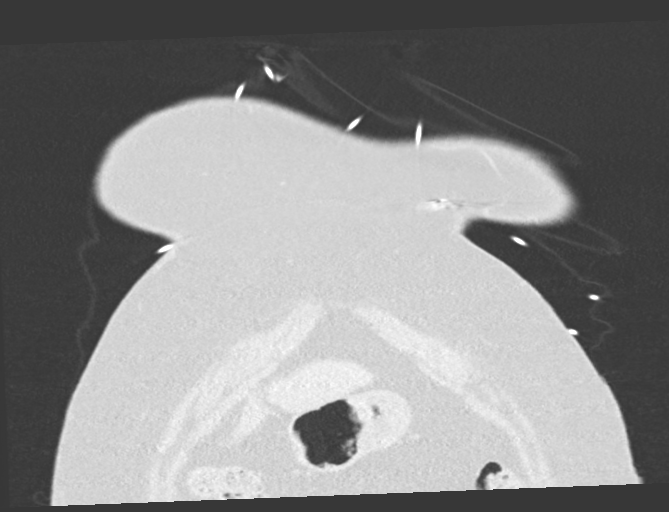
[im 70/174  lung]
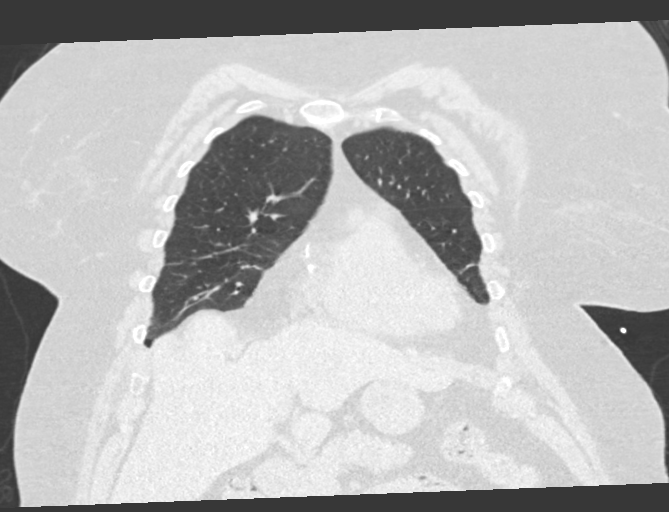
[im 104/174  lung]
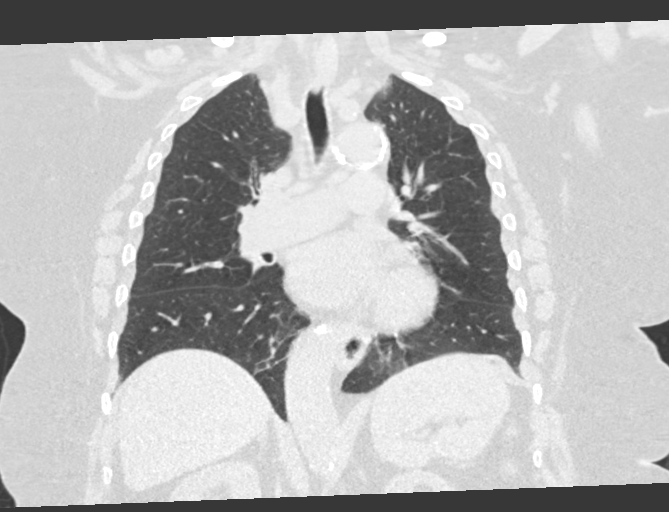

[15 of 36 positions shown; findings below may reference images not displayed]

FINDINGS: Cardiovascular: Limited evaluation without intravenous contrast.
Nonaneurysmal aorta. Moderate-to-marked aortic atherosclerosis.
Coronary vascular calcification. Slightly enlarged pulmonary
arterial trunk measuring up to 3.8 cm. It is normal heart size. No
pericardial effusion.

Mediastinum/Nodes: Midline trachea. No thyroid mass. Prominent left
low paratracheal lymph node measuring 1 cm. Esophagus within normal
limits.

Lungs/Pleura: Minimal emphysema. No acute consolidation or effusion.
No pneumothorax. Atelectasis or scar at the bases and right middle
lobe and lingula.

Upper Abdomen: Partially visualized calcified left adrenal mass, no
change. Surgical clips in the gallbladder fossa.

Musculoskeletal: Degenerative changes. No acute or suspicious
abnormality
IMPRESSION: 1. No acute pulmonary infiltrate is seen.
2. Minimal emphysema
3. Enlarged appearing pulmonary arterial trunk, possible pulmonary
arterial hypertension.

Aortic Atherosclerosis (H977Z-[id number].N) and Emphysema (H977Z-[id number].K).

## 2020-01-19 DIAGNOSIS — M6281 Muscle weakness (generalized): Secondary | ICD-10-CM | POA: Diagnosis not present

## 2020-01-19 DIAGNOSIS — J9622 Acute and chronic respiratory failure with hypercapnia: Secondary | ICD-10-CM | POA: Diagnosis not present

## 2020-01-19 DIAGNOSIS — R278 Other lack of coordination: Secondary | ICD-10-CM | POA: Diagnosis not present

## 2020-01-20 DIAGNOSIS — M6281 Muscle weakness (generalized): Secondary | ICD-10-CM | POA: Diagnosis not present

## 2020-01-20 DIAGNOSIS — R278 Other lack of coordination: Secondary | ICD-10-CM | POA: Diagnosis not present

## 2020-01-20 DIAGNOSIS — J9622 Acute and chronic respiratory failure with hypercapnia: Secondary | ICD-10-CM | POA: Diagnosis not present

## 2020-01-21 DIAGNOSIS — J9622 Acute and chronic respiratory failure with hypercapnia: Secondary | ICD-10-CM | POA: Diagnosis not present

## 2020-01-21 DIAGNOSIS — M6281 Muscle weakness (generalized): Secondary | ICD-10-CM | POA: Diagnosis not present

## 2020-01-21 DIAGNOSIS — R278 Other lack of coordination: Secondary | ICD-10-CM | POA: Diagnosis not present

## 2020-01-22 DIAGNOSIS — R278 Other lack of coordination: Secondary | ICD-10-CM | POA: Diagnosis not present

## 2020-01-22 DIAGNOSIS — M6281 Muscle weakness (generalized): Secondary | ICD-10-CM | POA: Diagnosis not present

## 2020-01-22 DIAGNOSIS — J9622 Acute and chronic respiratory failure with hypercapnia: Secondary | ICD-10-CM | POA: Diagnosis not present

## 2020-01-23 DIAGNOSIS — R278 Other lack of coordination: Secondary | ICD-10-CM | POA: Diagnosis not present

## 2020-01-23 DIAGNOSIS — M6281 Muscle weakness (generalized): Secondary | ICD-10-CM | POA: Diagnosis not present

## 2020-01-23 DIAGNOSIS — J9622 Acute and chronic respiratory failure with hypercapnia: Secondary | ICD-10-CM | POA: Diagnosis not present

## 2020-01-26 DIAGNOSIS — J9622 Acute and chronic respiratory failure with hypercapnia: Secondary | ICD-10-CM | POA: Diagnosis not present

## 2020-01-26 DIAGNOSIS — M6281 Muscle weakness (generalized): Secondary | ICD-10-CM | POA: Diagnosis not present

## 2020-01-26 DIAGNOSIS — R278 Other lack of coordination: Secondary | ICD-10-CM | POA: Diagnosis not present

## 2020-01-27 DIAGNOSIS — R278 Other lack of coordination: Secondary | ICD-10-CM | POA: Diagnosis not present

## 2020-01-27 DIAGNOSIS — M6281 Muscle weakness (generalized): Secondary | ICD-10-CM | POA: Diagnosis not present

## 2020-01-27 DIAGNOSIS — J9622 Acute and chronic respiratory failure with hypercapnia: Secondary | ICD-10-CM | POA: Diagnosis not present

## 2020-01-28 DIAGNOSIS — M6281 Muscle weakness (generalized): Secondary | ICD-10-CM | POA: Diagnosis not present

## 2020-01-28 DIAGNOSIS — R278 Other lack of coordination: Secondary | ICD-10-CM | POA: Diagnosis not present

## 2020-01-28 DIAGNOSIS — J9622 Acute and chronic respiratory failure with hypercapnia: Secondary | ICD-10-CM | POA: Diagnosis not present

## 2020-01-29 DIAGNOSIS — R278 Other lack of coordination: Secondary | ICD-10-CM | POA: Diagnosis not present

## 2020-01-29 DIAGNOSIS — M6281 Muscle weakness (generalized): Secondary | ICD-10-CM | POA: Diagnosis not present

## 2020-01-29 DIAGNOSIS — J9622 Acute and chronic respiratory failure with hypercapnia: Secondary | ICD-10-CM | POA: Diagnosis not present

## 2020-01-30 DIAGNOSIS — R278 Other lack of coordination: Secondary | ICD-10-CM | POA: Diagnosis not present

## 2020-01-30 DIAGNOSIS — M6281 Muscle weakness (generalized): Secondary | ICD-10-CM | POA: Diagnosis not present

## 2020-01-30 DIAGNOSIS — J9622 Acute and chronic respiratory failure with hypercapnia: Secondary | ICD-10-CM | POA: Diagnosis not present

## 2020-02-02 DIAGNOSIS — J9622 Acute and chronic respiratory failure with hypercapnia: Secondary | ICD-10-CM | POA: Diagnosis not present

## 2020-02-02 DIAGNOSIS — R278 Other lack of coordination: Secondary | ICD-10-CM | POA: Diagnosis not present

## 2020-02-02 DIAGNOSIS — M6281 Muscle weakness (generalized): Secondary | ICD-10-CM | POA: Diagnosis not present

## 2020-02-03 DIAGNOSIS — J9622 Acute and chronic respiratory failure with hypercapnia: Secondary | ICD-10-CM | POA: Diagnosis not present

## 2020-02-03 DIAGNOSIS — R278 Other lack of coordination: Secondary | ICD-10-CM | POA: Diagnosis not present

## 2020-02-03 DIAGNOSIS — M6281 Muscle weakness (generalized): Secondary | ICD-10-CM | POA: Diagnosis not present

## 2020-02-03 IMAGING — CR DG CHEST 2V
2 series · 2 of 2 positions shown · non-contrast
Comparison: 07/07/2017

CLINICAL DATA: Shortness of breath.

EXAM:
CHEST - 2 VIEW

[chest lat]
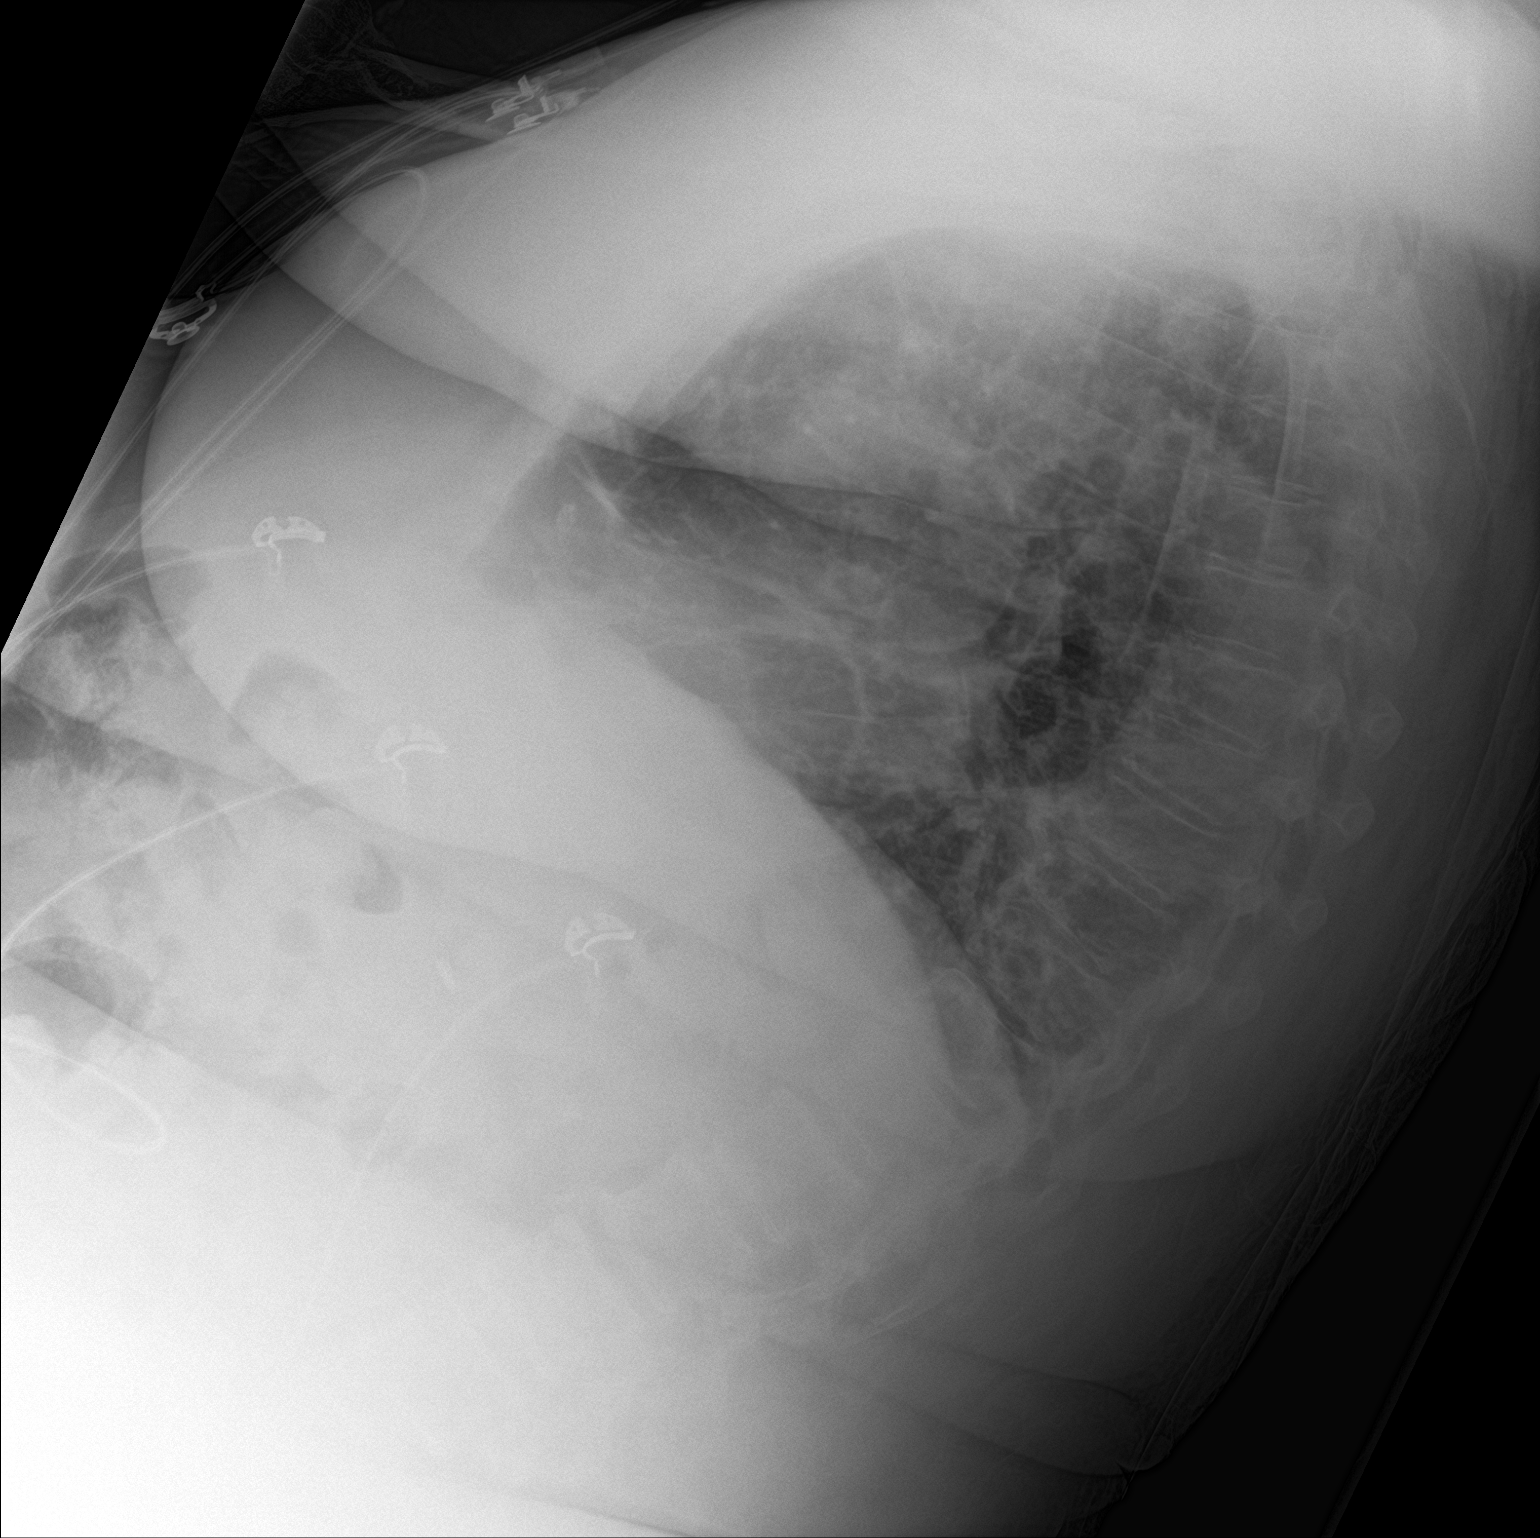

[chest ap]
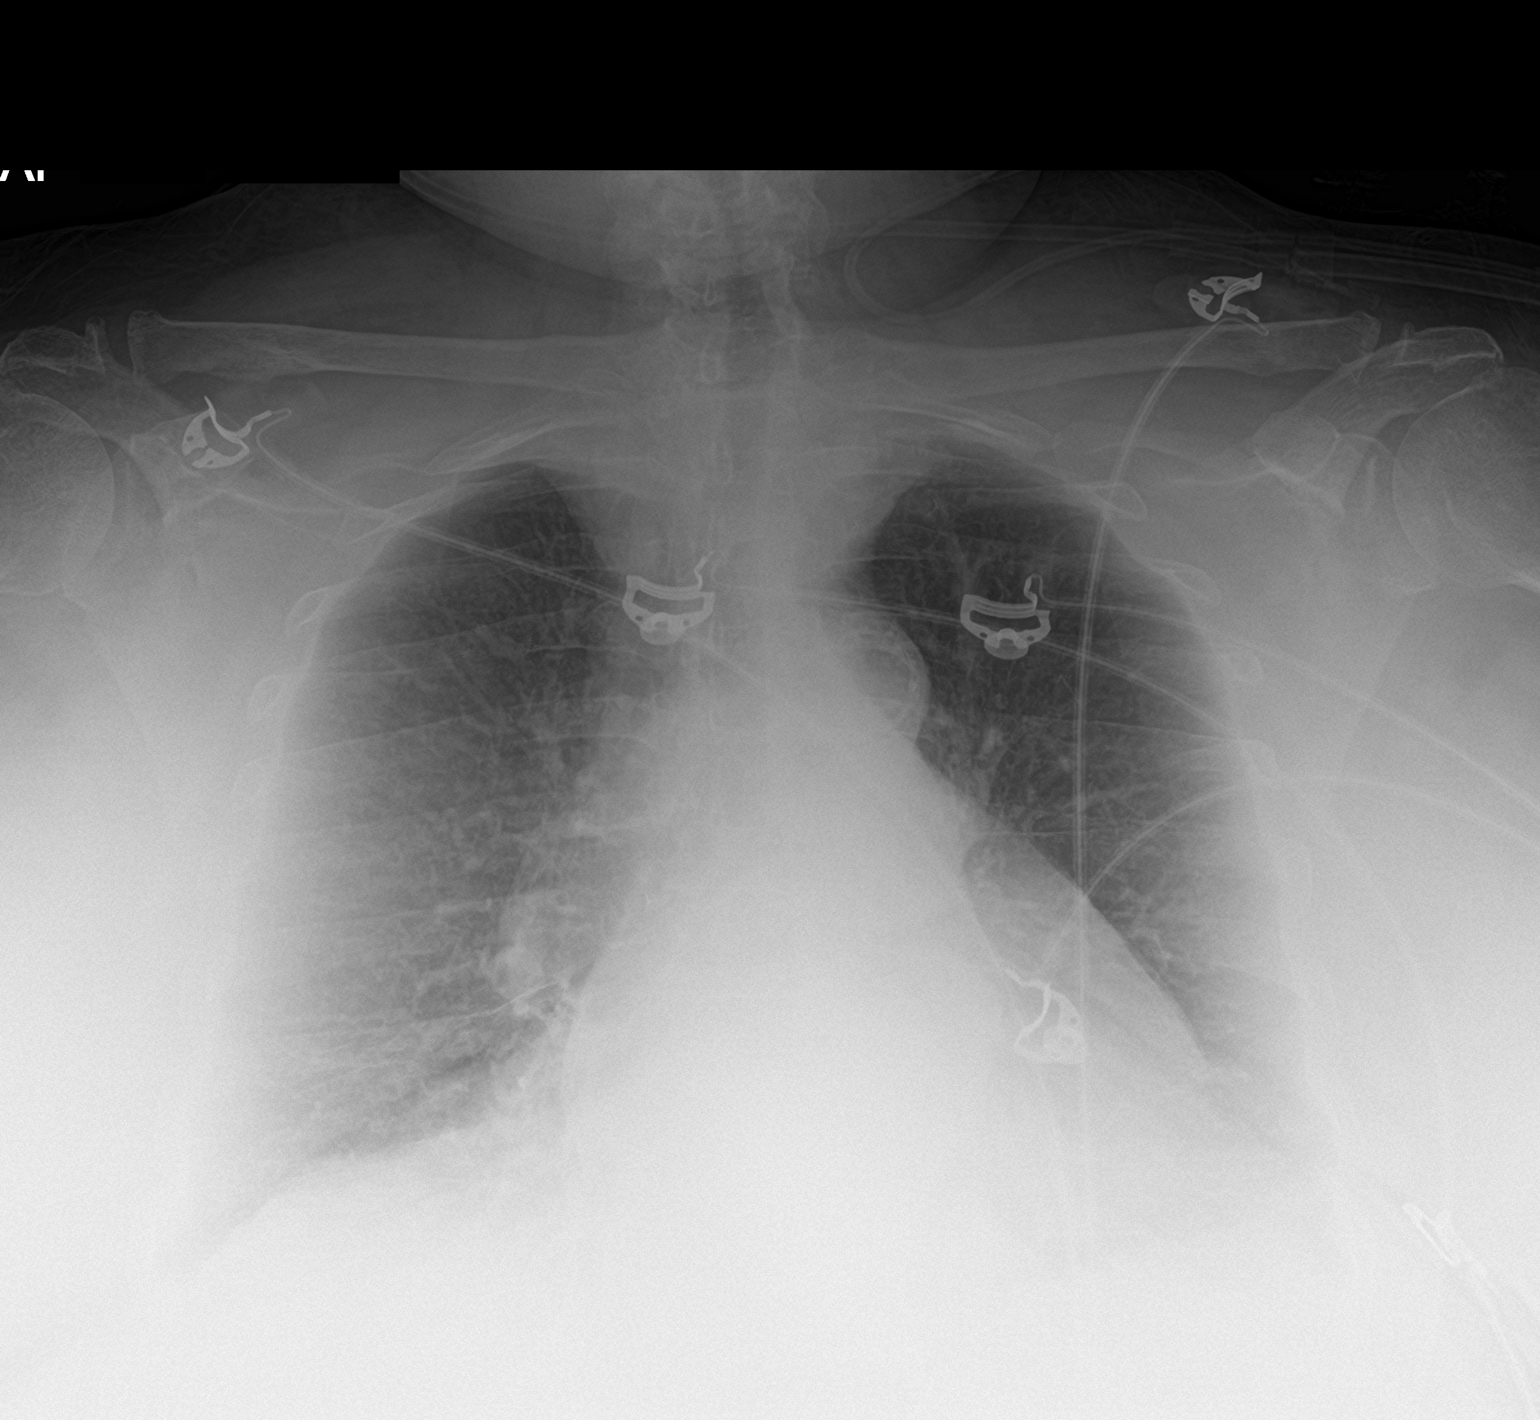

[2 of 2 positions shown; findings below may reference images not displayed]

FINDINGS: There is moderate cardiac enlargement. No pleural effusion or edema.
Aortic atherosclerosis. No airspace opacities. Spondylosis noted
within the thoracic spine.
IMPRESSION: No acute cardiopulmonary abnormalities.

Cardiac enlargement and aortic atherosclerosis. Aortic
Atherosclerosis ([id number].R).

## 2020-02-04 DIAGNOSIS — J9622 Acute and chronic respiratory failure with hypercapnia: Secondary | ICD-10-CM | POA: Diagnosis not present

## 2020-02-04 DIAGNOSIS — M6281 Muscle weakness (generalized): Secondary | ICD-10-CM | POA: Diagnosis not present

## 2020-02-04 DIAGNOSIS — R278 Other lack of coordination: Secondary | ICD-10-CM | POA: Diagnosis not present

## 2020-02-05 DIAGNOSIS — J9622 Acute and chronic respiratory failure with hypercapnia: Secondary | ICD-10-CM | POA: Diagnosis not present

## 2020-02-05 DIAGNOSIS — M6281 Muscle weakness (generalized): Secondary | ICD-10-CM | POA: Diagnosis not present

## 2020-02-05 DIAGNOSIS — R278 Other lack of coordination: Secondary | ICD-10-CM | POA: Diagnosis not present

## 2020-02-06 DIAGNOSIS — R278 Other lack of coordination: Secondary | ICD-10-CM | POA: Diagnosis not present

## 2020-02-06 DIAGNOSIS — M6281 Muscle weakness (generalized): Secondary | ICD-10-CM | POA: Diagnosis not present

## 2020-02-06 DIAGNOSIS — J9622 Acute and chronic respiratory failure with hypercapnia: Secondary | ICD-10-CM | POA: Diagnosis not present

## 2020-02-09 DIAGNOSIS — M6281 Muscle weakness (generalized): Secondary | ICD-10-CM | POA: Diagnosis not present

## 2020-02-09 DIAGNOSIS — R278 Other lack of coordination: Secondary | ICD-10-CM | POA: Diagnosis not present

## 2020-02-09 DIAGNOSIS — J9622 Acute and chronic respiratory failure with hypercapnia: Secondary | ICD-10-CM | POA: Diagnosis not present

## 2020-02-10 DIAGNOSIS — J9622 Acute and chronic respiratory failure with hypercapnia: Secondary | ICD-10-CM | POA: Diagnosis not present

## 2020-02-10 DIAGNOSIS — R278 Other lack of coordination: Secondary | ICD-10-CM | POA: Diagnosis not present

## 2020-02-10 DIAGNOSIS — M6281 Muscle weakness (generalized): Secondary | ICD-10-CM | POA: Diagnosis not present

## 2020-02-11 DIAGNOSIS — M6281 Muscle weakness (generalized): Secondary | ICD-10-CM | POA: Diagnosis not present

## 2020-02-11 DIAGNOSIS — R278 Other lack of coordination: Secondary | ICD-10-CM | POA: Diagnosis not present

## 2020-02-11 DIAGNOSIS — J9622 Acute and chronic respiratory failure with hypercapnia: Secondary | ICD-10-CM | POA: Diagnosis not present

## 2020-02-12 DIAGNOSIS — J9622 Acute and chronic respiratory failure with hypercapnia: Secondary | ICD-10-CM | POA: Diagnosis not present

## 2020-02-12 DIAGNOSIS — M6281 Muscle weakness (generalized): Secondary | ICD-10-CM | POA: Diagnosis not present

## 2020-02-12 DIAGNOSIS — R278 Other lack of coordination: Secondary | ICD-10-CM | POA: Diagnosis not present

## 2020-02-13 DIAGNOSIS — M6281 Muscle weakness (generalized): Secondary | ICD-10-CM | POA: Diagnosis not present

## 2020-02-13 DIAGNOSIS — J9622 Acute and chronic respiratory failure with hypercapnia: Secondary | ICD-10-CM | POA: Diagnosis not present

## 2020-02-13 DIAGNOSIS — R278 Other lack of coordination: Secondary | ICD-10-CM | POA: Diagnosis not present

## 2020-02-16 DIAGNOSIS — R278 Other lack of coordination: Secondary | ICD-10-CM | POA: Diagnosis not present

## 2020-02-16 DIAGNOSIS — J9622 Acute and chronic respiratory failure with hypercapnia: Secondary | ICD-10-CM | POA: Diagnosis not present

## 2020-02-16 DIAGNOSIS — M6281 Muscle weakness (generalized): Secondary | ICD-10-CM | POA: Diagnosis not present

## 2020-03-03 IMAGING — DX DG HAND COMPLETE 3+V*L*
3 series · 3 of 3 positions shown · non-contrast
Comparison: None.

CLINICAL DATA: 76 y/o F; left hand and pinky pain. Pain worse in
the last 2-4 months.

EXAM:
LEFT HAND - COMPLETE 3+ VIEW

[hand ap]
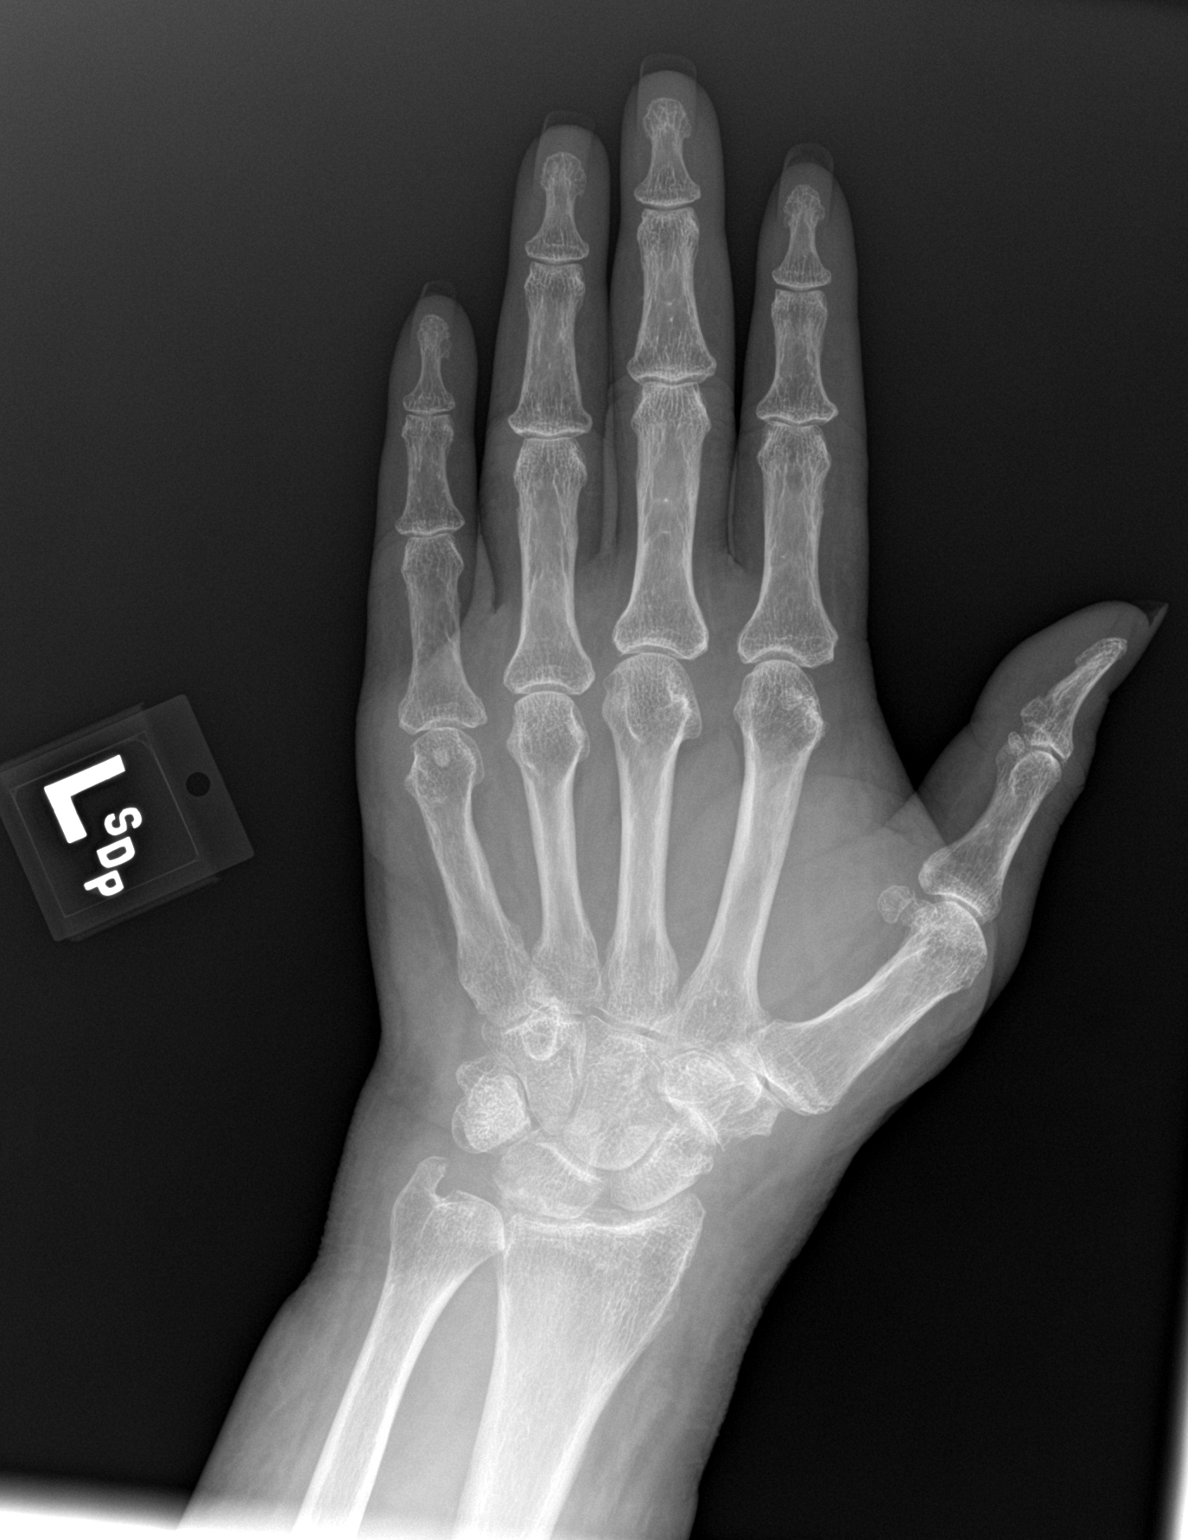

[hand obl]
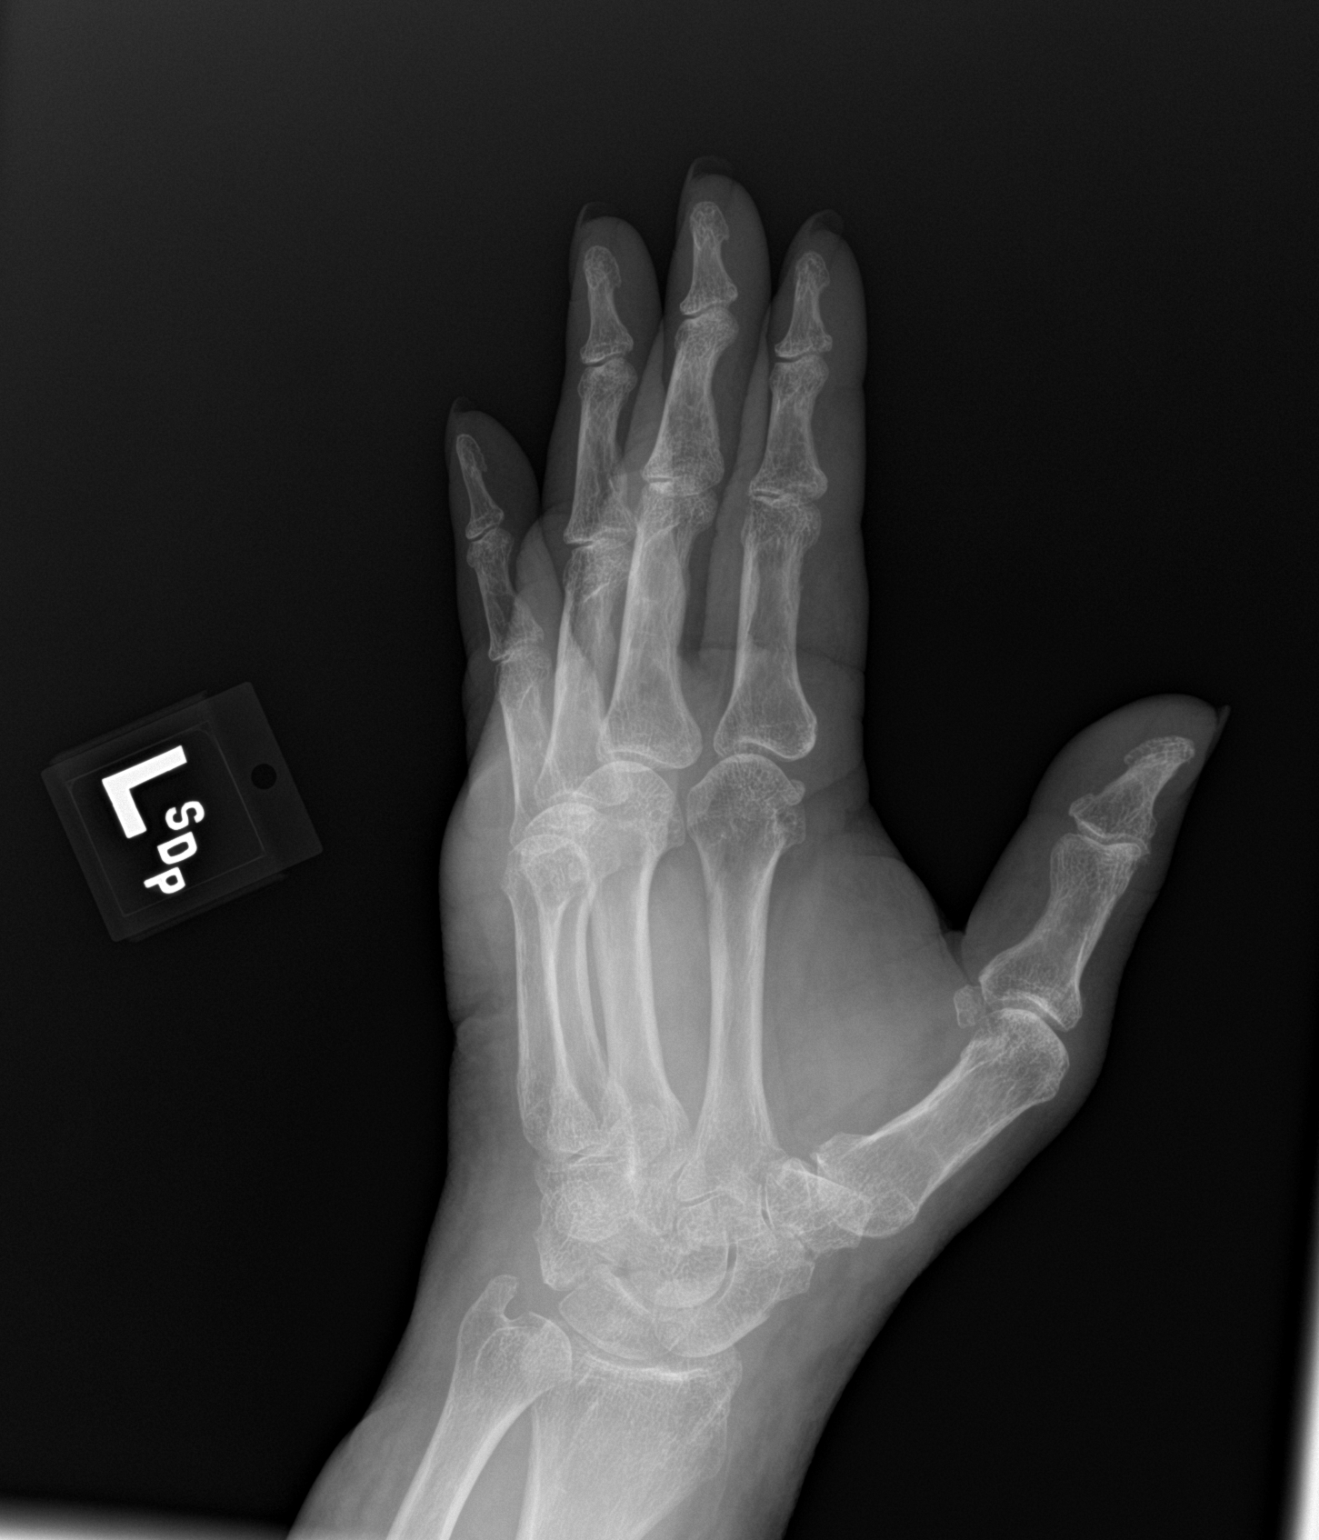

[hand lat]
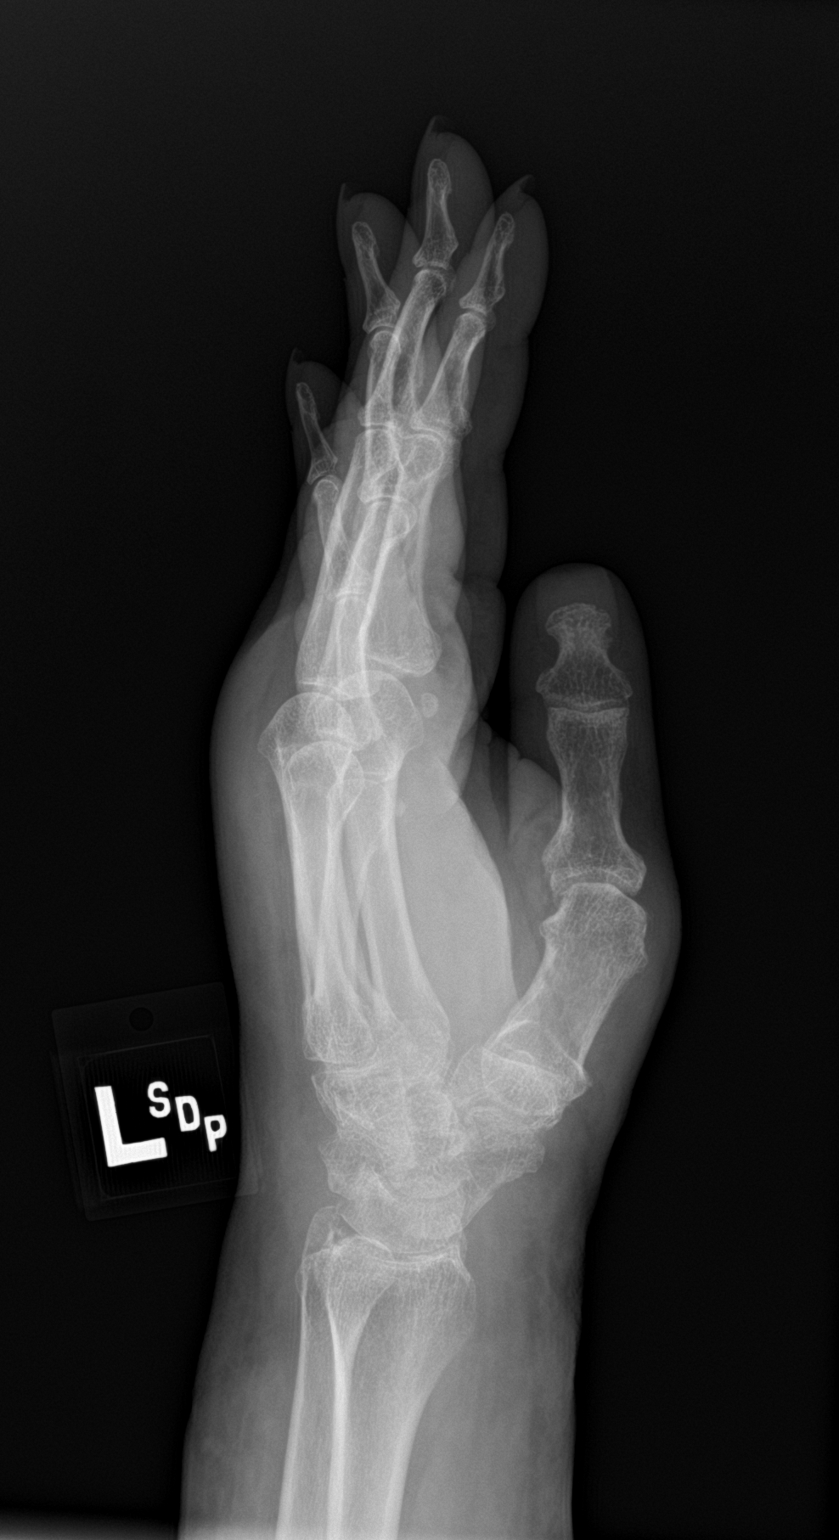

[3 of 3 positions shown; findings below may reference images not displayed]

FINDINGS: There is no evidence of fracture or dislocation. Mild osteoarthrosis
of the first interphalangeal joint and the basal joint. Bones are
demineralized.
IMPRESSION: 1. No acute osseous abnormality identified.
2. Mild osteoarthrosis of the first interphalangeal and basal
joints.

By: Andebet Nasasra M.D.

## 2020-03-07 DIAGNOSIS — R042 Hemoptysis: Secondary | ICD-10-CM | POA: Diagnosis not present

## 2020-03-07 DIAGNOSIS — R918 Other nonspecific abnormal finding of lung field: Secondary | ICD-10-CM | POA: Diagnosis not present

## 2020-03-08 DIAGNOSIS — F411 Generalized anxiety disorder: Secondary | ICD-10-CM | POA: Diagnosis not present

## 2020-03-08 DIAGNOSIS — R058 Other specified cough: Secondary | ICD-10-CM | POA: Diagnosis not present

## 2020-03-08 DIAGNOSIS — J189 Pneumonia, unspecified organism: Secondary | ICD-10-CM | POA: Diagnosis not present

## 2020-03-08 DIAGNOSIS — I69991 Dysphagia following unspecified cerebrovascular disease: Secondary | ICD-10-CM | POA: Diagnosis not present

## 2020-03-08 IMAGING — DX DG CHEST 1V
1 series · 1 of 1 positions shown · non-contrast
Comparison: Chest radiograph 07/24/2017 and earlier.

CLINICAL DATA: 76-year-old female with shortness of breath.

EXAM:
CHEST  1 VIEW

[chest ap]
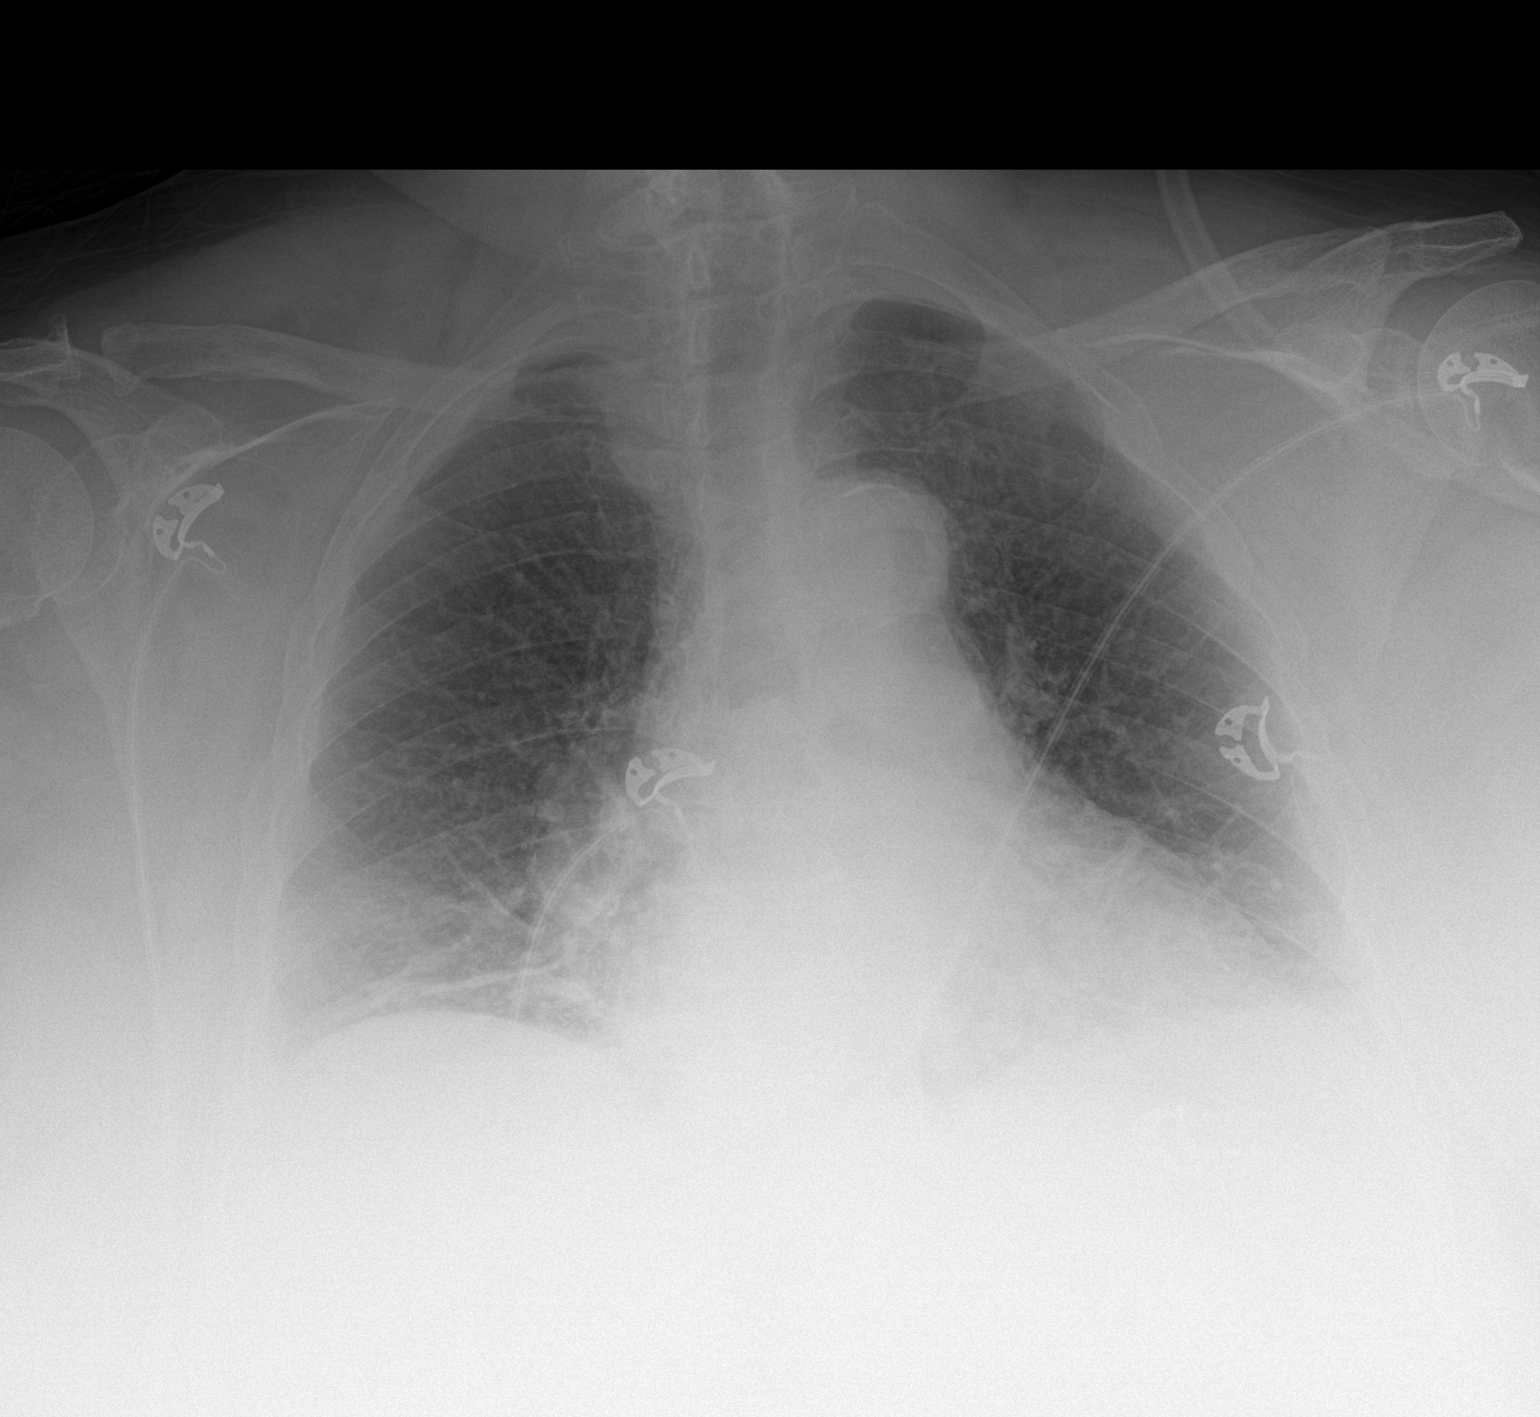

[1 of 1 positions shown; findings below may reference images not displayed]

FINDINGS: Portable AP semi upright view at 1091 hours. Stable mild
cardiomegaly. Stable tortuous and calcified thoracic aorta. Stable
lung volumes since [REDACTED], lower than [REDACTED]. Stable pulmonary
vascularity. No pneumothorax. No pleural effusion or consolidation.
Increased streaky bibasilar opacity that most resembles atelectasis.
Paucity of bowel gas in the upper abdomen. Visualized tracheal air
column is within normal limits.
IMPRESSION: 1. Continued low lung volumes. Increased streaky bibasilar opacity
most resembles atelectasis.
2. Stable cardiomegaly and calcified aortic atherosclerosis.

## 2020-03-23 IMAGING — DX DG CHEST 1V PORT
1 series · 1 of 1 positions shown · non-contrast
Comparison: 08/27/2017.  04/05/2017.  11/28/2015.

CLINICAL DATA: Chest pain.  Shortness of breath.

EXAM:
PORTABLE CHEST 1 VIEW

[chest ap]
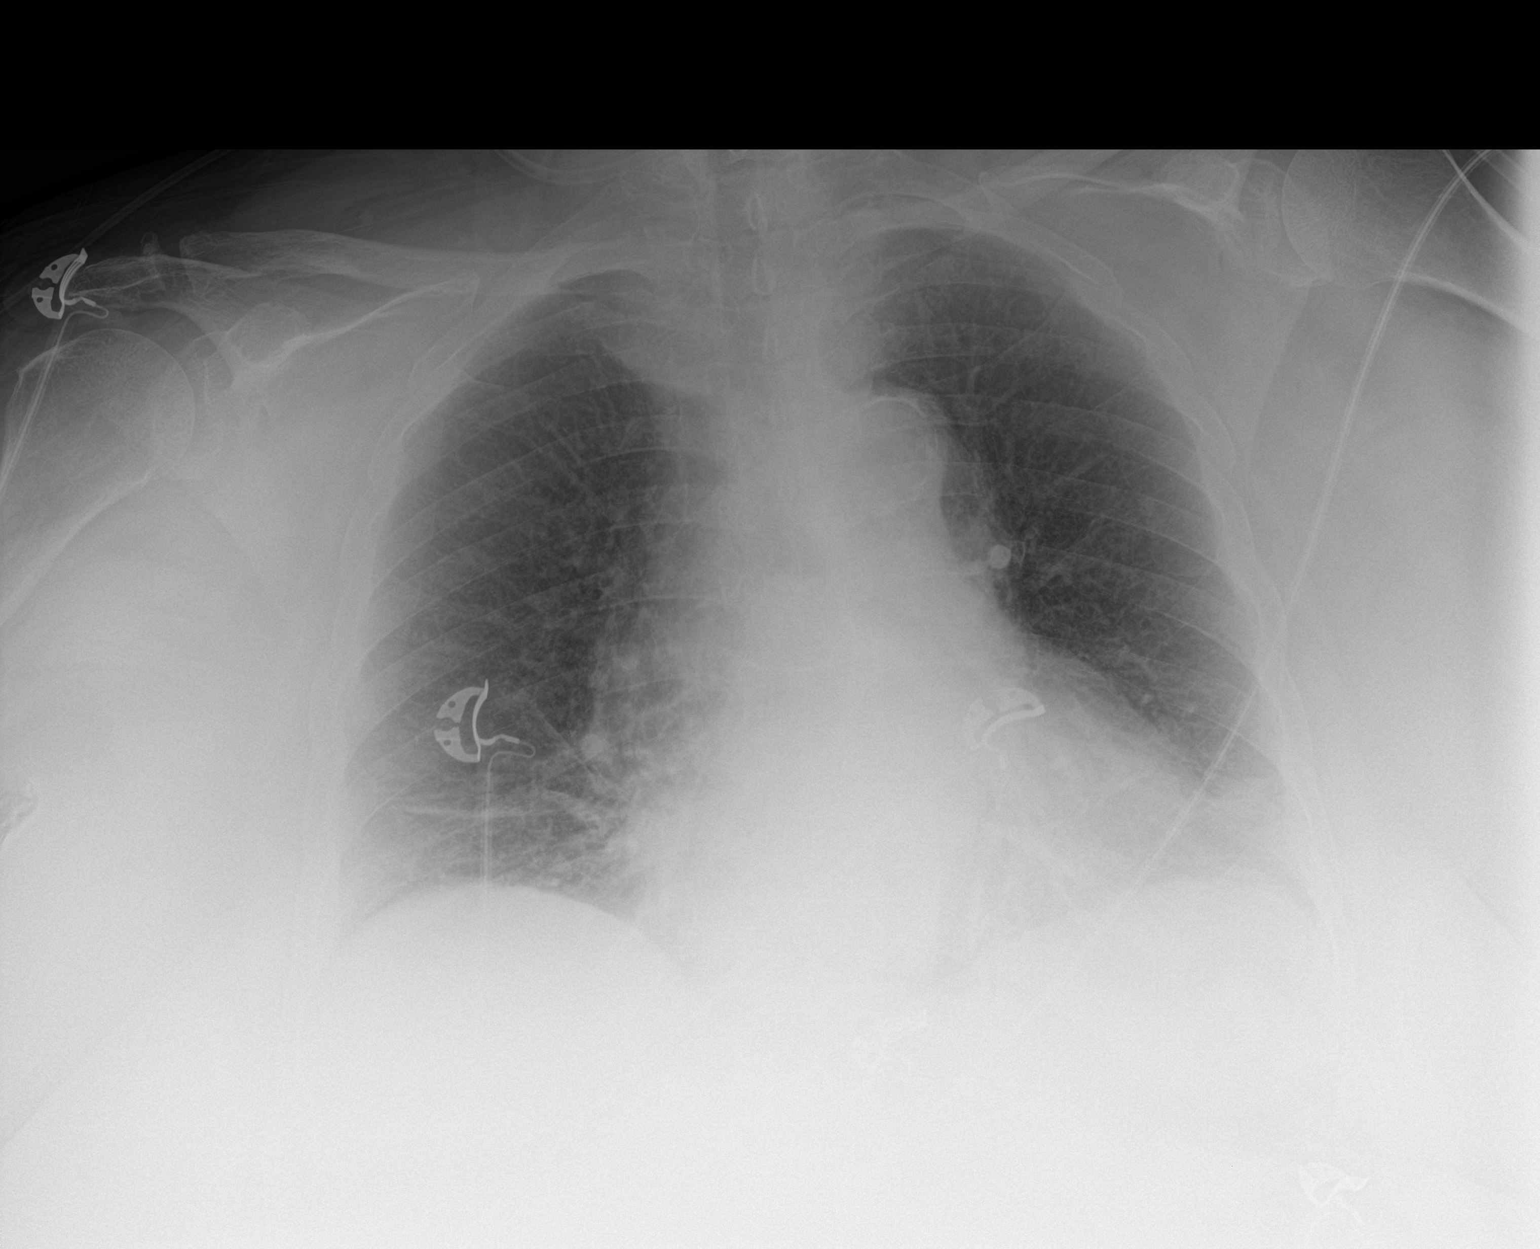

[1 of 1 positions shown; findings below may reference images not displayed]

FINDINGS: Cardiomegaly with normal pulmonary vascularity. Low lung volumes
with mild bibasilar atelectasis/infiltrates. A component of scarring
may be present. No pleural effusion or pneumothorax.
IMPRESSION: 1. Low lung volumes with mild bibasilar atelectasis/infiltrates. A
component of bibasilar scarring may be present. Similar findings
noted on prior exam.

2.  Cardiomegaly.  No pulmonary venous congestion.

## 2020-03-31 ENCOUNTER — Encounter: Payer: Self-pay | Admitting: Nurse Practitioner

## 2020-03-31 ENCOUNTER — Non-Acute Institutional Stay: Payer: Medicare HMO | Admitting: Nurse Practitioner

## 2020-03-31 DIAGNOSIS — I509 Heart failure, unspecified: Secondary | ICD-10-CM | POA: Diagnosis not present

## 2020-03-31 DIAGNOSIS — Z515 Encounter for palliative care: Secondary | ICD-10-CM

## 2020-03-31 IMAGING — DX DG CHEST 1V
1 series · 1 of 1 positions shown · non-contrast
Comparison: September 11, 2017

CLINICAL DATA: Acute onset respiratory distress up on walking.

EXAM:
CHEST  1 VIEW

[chest ap]
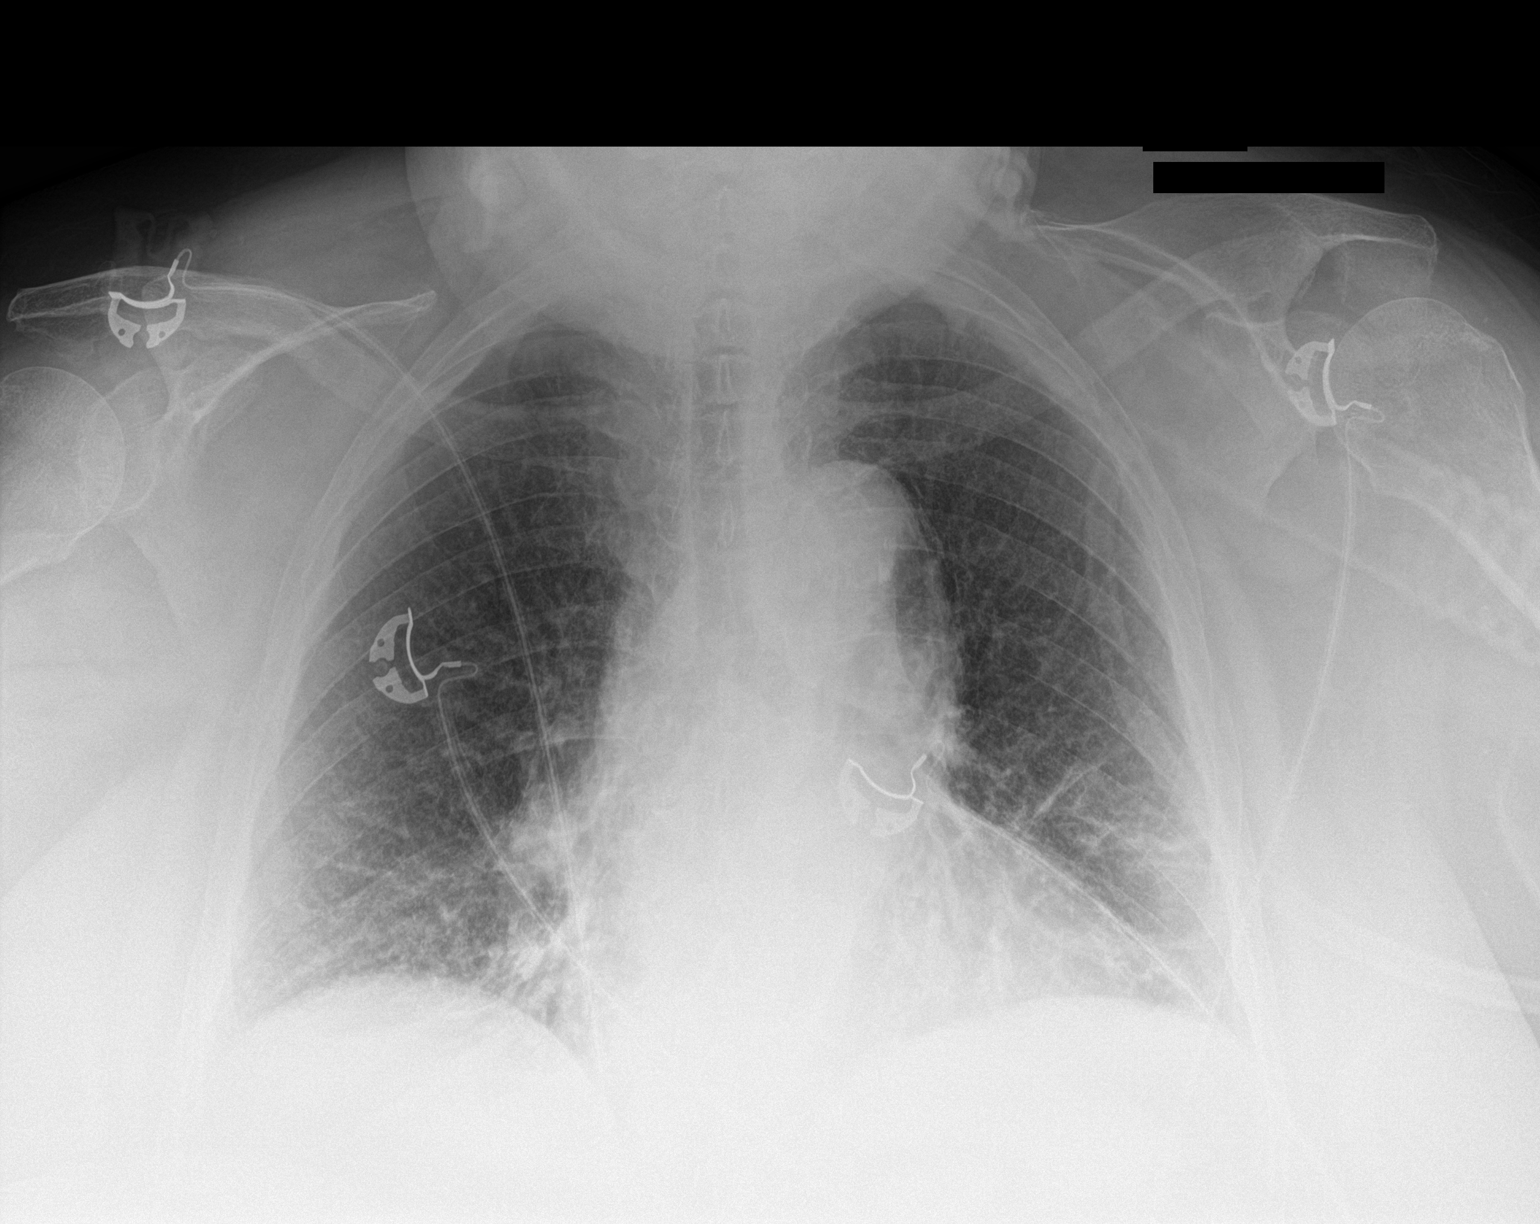

[1 of 1 positions shown; findings below may reference images not displayed]

FINDINGS: The heart size and mediastinal contours are stable. The heart size
is enlarged. There is mild diffuse increased pulmonary interstitium
bilaterally. Mild atelectasis versus scar of both lung bases are
noted. There is no focal pneumonia or pleural effusion. The
visualized skeletal structures are unremarkable.
IMPRESSION: Mild congestive heart failure.

Mild atelectasis versus scar of bilateral lung bases.

## 2020-03-31 NOTE — Progress Notes (Signed)
[facility] Consult Note Telephone: [phone]  Fax: [phone]  PATIENT NAME: Marlesa Chavanne DOB: [birth year] MRN: [id number]  PRIMARY CARE PROVIDER: [facility] RESPONSIBLE PARTY:Self/[name] spouse [phone]  RECOMMENDATIONS and PLAN: 1.ACP: remains a full code; most form in ACP  2.Edema secondary toCHF, monitor weights, elevate  3.Shortness of breathsecondary toCHF, continue diuresing, inhalation therapy; Continuous O2,Continue daily weights  4.Palliative care encounter; Palliative medicine team will continue to support patient, patient's family, and medical team. Visit consisted of counseling and education dealing with the complex and emotionally intense issues of symptom management and palliative care in the setting of serious and potentially life-threatening illness  5. F/u 4 weeks or sooner if declines; high risk for re-hospitalization  I spent 40 minutes providing this consultation,  Start at 11:15am More than 50% of the time in this consultation was spent coordinating communication.   HISTORY OF PRESENT ILLNESS:  Laniqua Hamsher is a 80 y.o. 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 Ms. Lemley resides at [place] at [facility]. Ms Swilley remains bed bound, require staff to assist her with  turning, positioning, bathing, dressing. Ms Pezzino does feed herself after tray setup. Current weight 211.1 lb with BMI 37.4 currently on a heart-healthy diet, regular texture regular liquid consistency.  No recent wounds, falls. Staff endorses Ms Profeta does intermittently complain of her left knee hurting for what she does get Voltaren gel with relief. Staff endorses Ms. Vanriper has no other changes or concerns. At present Ms Bieri is lying in bed, appears chronically ill, obese, too dependent, no distress. No visitors present. I visited and observed Ms Tilmon. We talked about purpose of Palliative care visit. Ms Kazar in agreement. Ms Dunagan did make eye contact with verbal cues. Ms Bagot was cooperative with [place] meant. We talked about residing at facility. Ms Gustaveson talked about her left knee pain. We talked about the importance of mobility. Ms Eighmy endorses that she is not able to move much do to her left knee pain. We talked about for appetite for which she endorses she is not hungry. Noted mild cognitive impairment with limited verbal discussion. Most of Palliative visit supportive. Medical goals reviewed. I have attempted to contact Ms Huguley spouse Spero Curb for update on Palliative care visit. Will continue to follow monitor with Palliative care as she is a high risk for rehospitalization, slow progressive decline. I updated nursing staff, may consider PT for left knee chronic pain for non-medication pain modalities  Palliative Care was asked to help to continue to address goals of care.   CODE STATUS: full code  PPS: Full code HOSPICE ELIGIBILITY/DIAGNOSIS: TBD  PAST MEDICAL HISTORY:  Past Medical History:  Diagnosis Date  . Angioedema   . Anxiety and depression   . CHF (congestive heart failure) ([place])   . Chronic constipation   . Chronic kidney disease    stage 3  . COPD (chronic obstructive pulmonary disease) ([place])   . Diabetes mellitus without complication ([place])   . Gallstones   .  GERD (gastroesophageal reflux  disease)   . Hypertension   . Left ventricular hypertrophy   . Osteoarthritis   . Prurigo nodularis   . Seizures ([place])   . Stroke ([place])   . Tobacco abuse   . Vitamin D deficiency disease     SOCIAL HX:  Social History   Tobacco Use  . Smoking status: Former Smoker    Packs/day: 0.50    Years: 60.00    Pack years: 30.00    Types: Cigarettes  . Smokeless tobacco: Never Used  . Tobacco comment: quite 15moago-03/26/18  Substance Use Topics  . Alcohol use: No    Alcohol/week: 0.0 standard drinks    Comment: rare    ALLERGIES:  Allergies  Allergen Reactions  . Bee Venom Swelling  . Enalapril Maleate Swelling      PHYSICAL EXAM:   General: debilitated, chronically ill, O2 dependence, oriented to self, place Cardiovascular: regular rate and rhythm Pulmonary: clear ant fields; decrease bases Extremities: +BLE edema, no joint deformities Neurological: functional quadriplegic  Tilman Mcclaren ZIhor Gully NP

## 2020-04-01 ENCOUNTER — Other Ambulatory Visit: Payer: Self-pay

## 2020-04-08 DIAGNOSIS — I5032 Chronic diastolic (congestive) heart failure: Secondary | ICD-10-CM | POA: Diagnosis not present

## 2020-04-08 DIAGNOSIS — G4089 Other seizures: Secondary | ICD-10-CM | POA: Diagnosis not present

## 2020-04-08 DIAGNOSIS — E1122 Type 2 diabetes mellitus with diabetic chronic kidney disease: Secondary | ICD-10-CM | POA: Diagnosis not present

## 2020-04-08 DIAGNOSIS — J441 Chronic obstructive pulmonary disease with (acute) exacerbation: Secondary | ICD-10-CM | POA: Diagnosis not present

## 2020-04-08 IMAGING — US US CAROTID DUPLEX BILAT
1 series · 13 of 24 positions shown · non-contrast
Comparison: None

CLINICAL DATA: 76-year-old female with a history of stroke like
symptoms.

Cardiovascular risk factors include hypertension, known stroke/TIA
EXAM:
BILATERAL CAROTID DUPLEX ULTRASOUND
TECHNIQUE: Gray scale imaging, color Doppler and duplex ultrasound were
performed of bilateral carotid and vertebral arteries in the neck.

[Series 1: us carotid duplex bilat · 0.07mm/px · 13 of 65 slices shown]
[im 1/65]
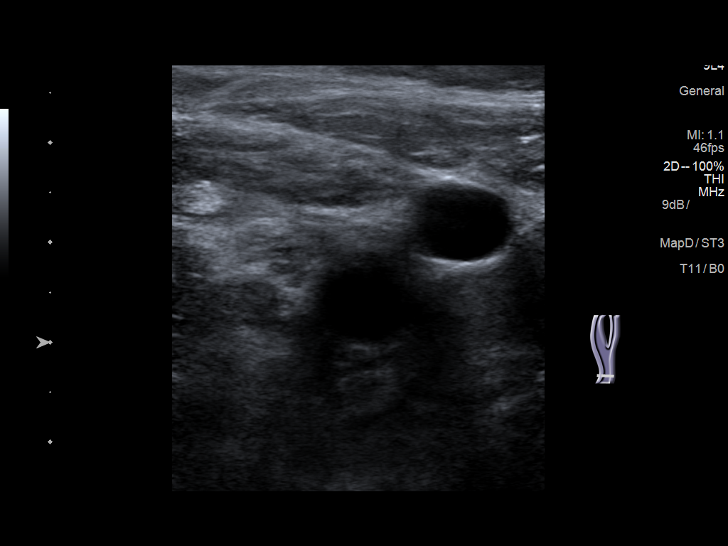
[im 6/65]
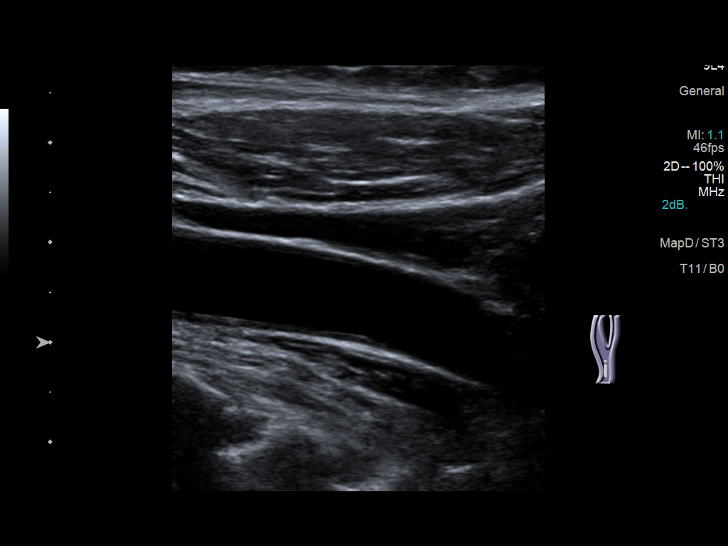
[im 12/65]
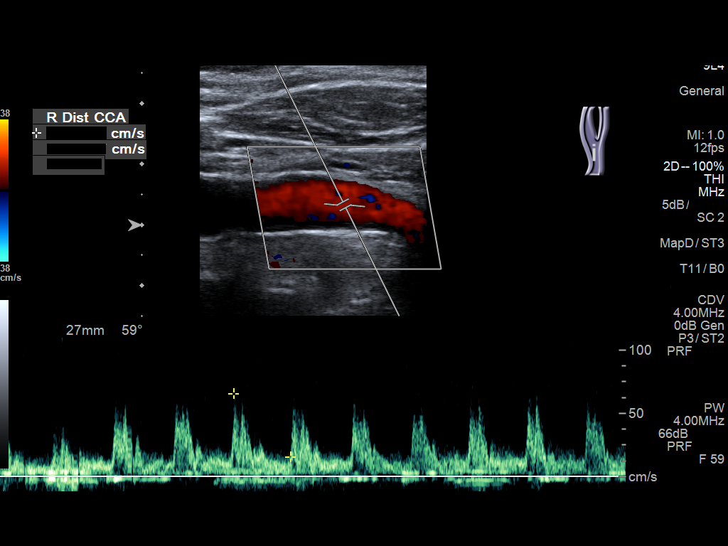
[im 17/65]
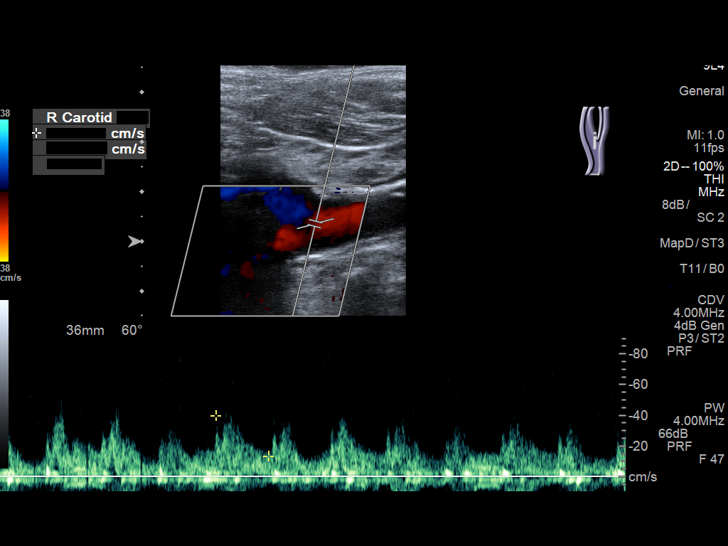
[im 23/65]
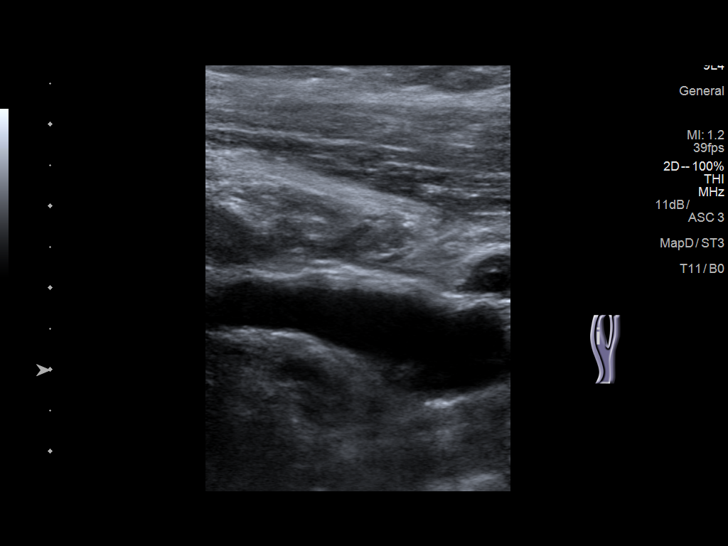
[im 28/65]
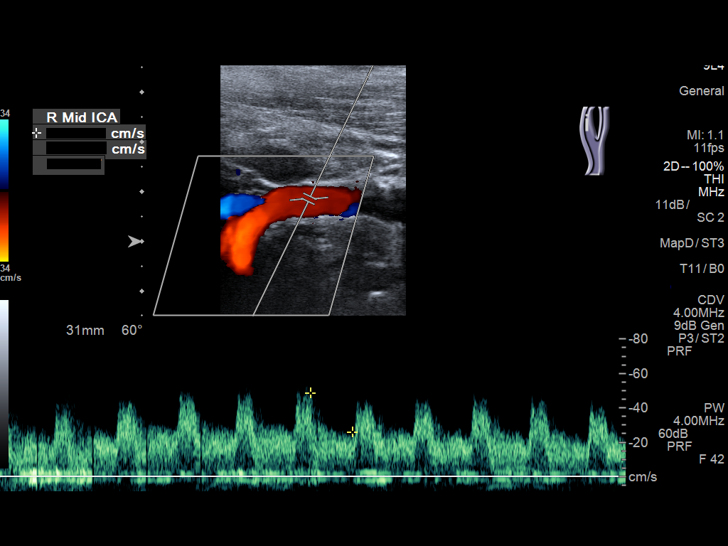
[im 34/65]
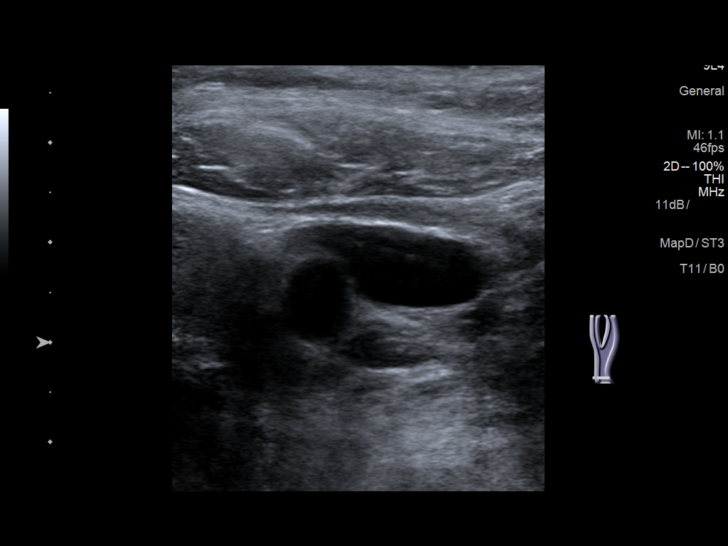
[im 37/65]
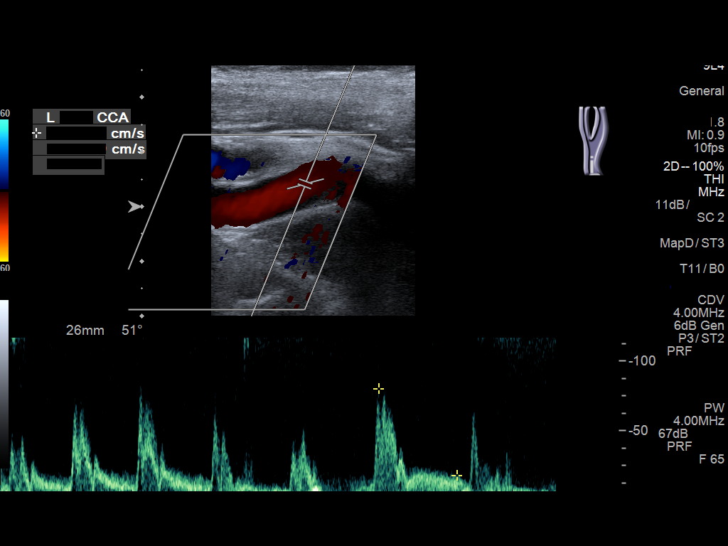
[im 42/65]
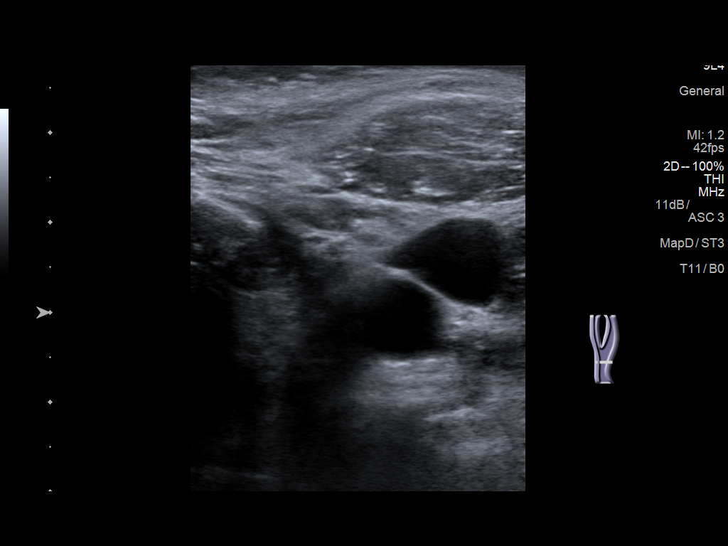
[im 48/65]
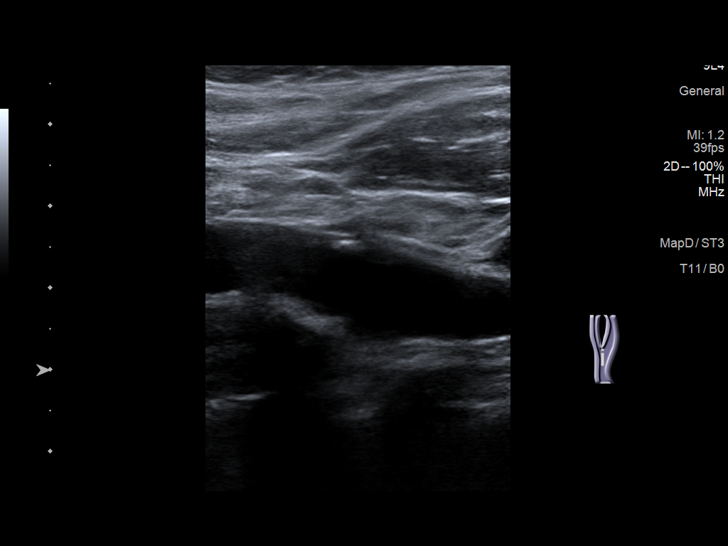
[im 53/65]
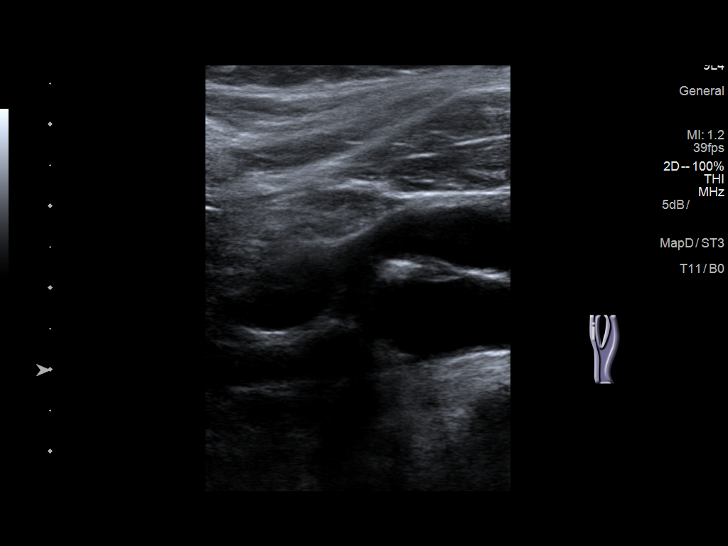
[im 59/65]
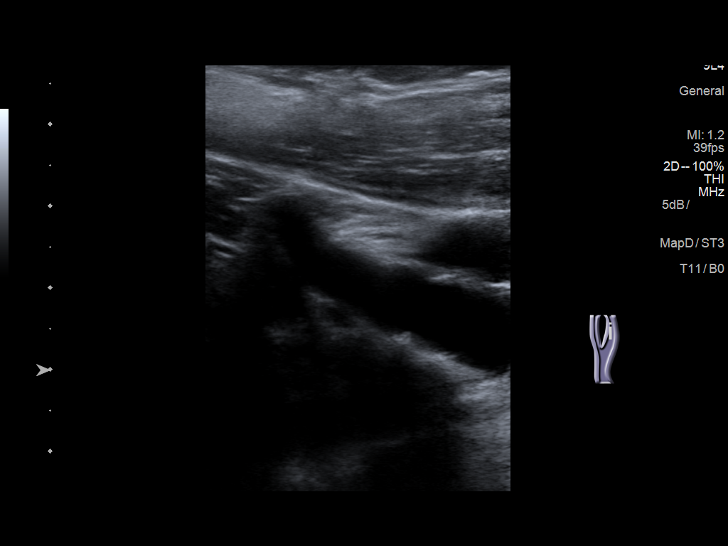
[im 65/65]
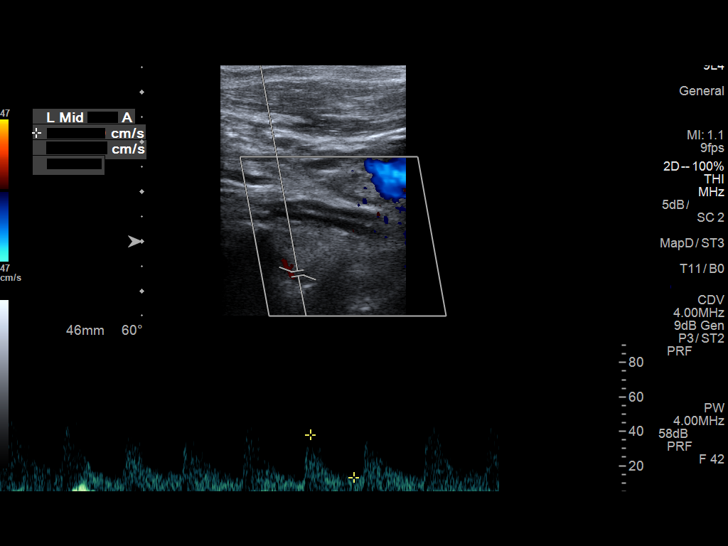

[13 of 24 positions shown; findings below may reference images not displayed]

FINDINGS: Criteria: Quantification of carotid stenosis is based on velocity
parameters that correlate the residual internal carotid diameter
with NASCET-based stenosis levels, using the diameter of the distal
internal carotid lumen as the denominator for stenosis measurement.

The following velocity measurements were obtained:

RIGHT

ICA:  Systolic 61 cm/sec, Diastolic 32 cm/sec

CCA:  69 cm/sec

SYSTOLIC ICA/CCA RATIO:

ECA:  67 cm/sec

LEFT

ICA:  Systolic 65 cm/sec, Diastolic 31 cm/sec

CCA:  67 cm/sec

SYSTOLIC ICA/CCA RATIO:

ECA:  67 cm/sec

Right Brachial SBP: Not acquired

Left Brachial SBP: Not acquired

RIGHT CAROTID ARTERY: No significant calcified disease of the right
common carotid artery. Intermediate waveform maintained.
Heterogeneous plaque without significant calcifications at the right
carotid bifurcation. Low resistance waveform of the right ICA. No
significant tortuosity.

RIGHT VERTEBRAL ARTERY: Antegrade flow with low resistance waveform.

LEFT CAROTID ARTERY: No significant calcified disease of the left
common carotid artery. Intermediate waveform maintained.
Heterogeneous plaque at the left carotid bifurcation without
significant calcifications. Low resistance waveform of the left ICA.

LEFT VERTEBRAL ARTERY:  Antegrade flow with low resistance waveform.
IMPRESSION: Color duplex indicates minimal heterogeneous plaque, with no
hemodynamically significant stenosis by duplex criteria in the
extracranial cerebrovascular circulation.

## 2020-04-09 DIAGNOSIS — I5032 Chronic diastolic (congestive) heart failure: Secondary | ICD-10-CM | POA: Diagnosis not present

## 2020-04-09 DIAGNOSIS — J449 Chronic obstructive pulmonary disease, unspecified: Secondary | ICD-10-CM | POA: Diagnosis not present

## 2020-04-09 DIAGNOSIS — I10 Essential (primary) hypertension: Secondary | ICD-10-CM | POA: Diagnosis not present

## 2020-04-09 DIAGNOSIS — E7849 Other hyperlipidemia: Secondary | ICD-10-CM | POA: Diagnosis not present

## 2020-04-09 DIAGNOSIS — E119 Type 2 diabetes mellitus without complications: Secondary | ICD-10-CM | POA: Diagnosis not present

## 2020-04-11 IMAGING — DX DG CHEST 1V PORT
1 series · 1 of 1 positions shown · non-contrast
Comparison: September 19, 2017

CLINICAL DATA: Shortness of breath.

EXAM:
PORTABLE CHEST 1 VIEW

[chest ap]
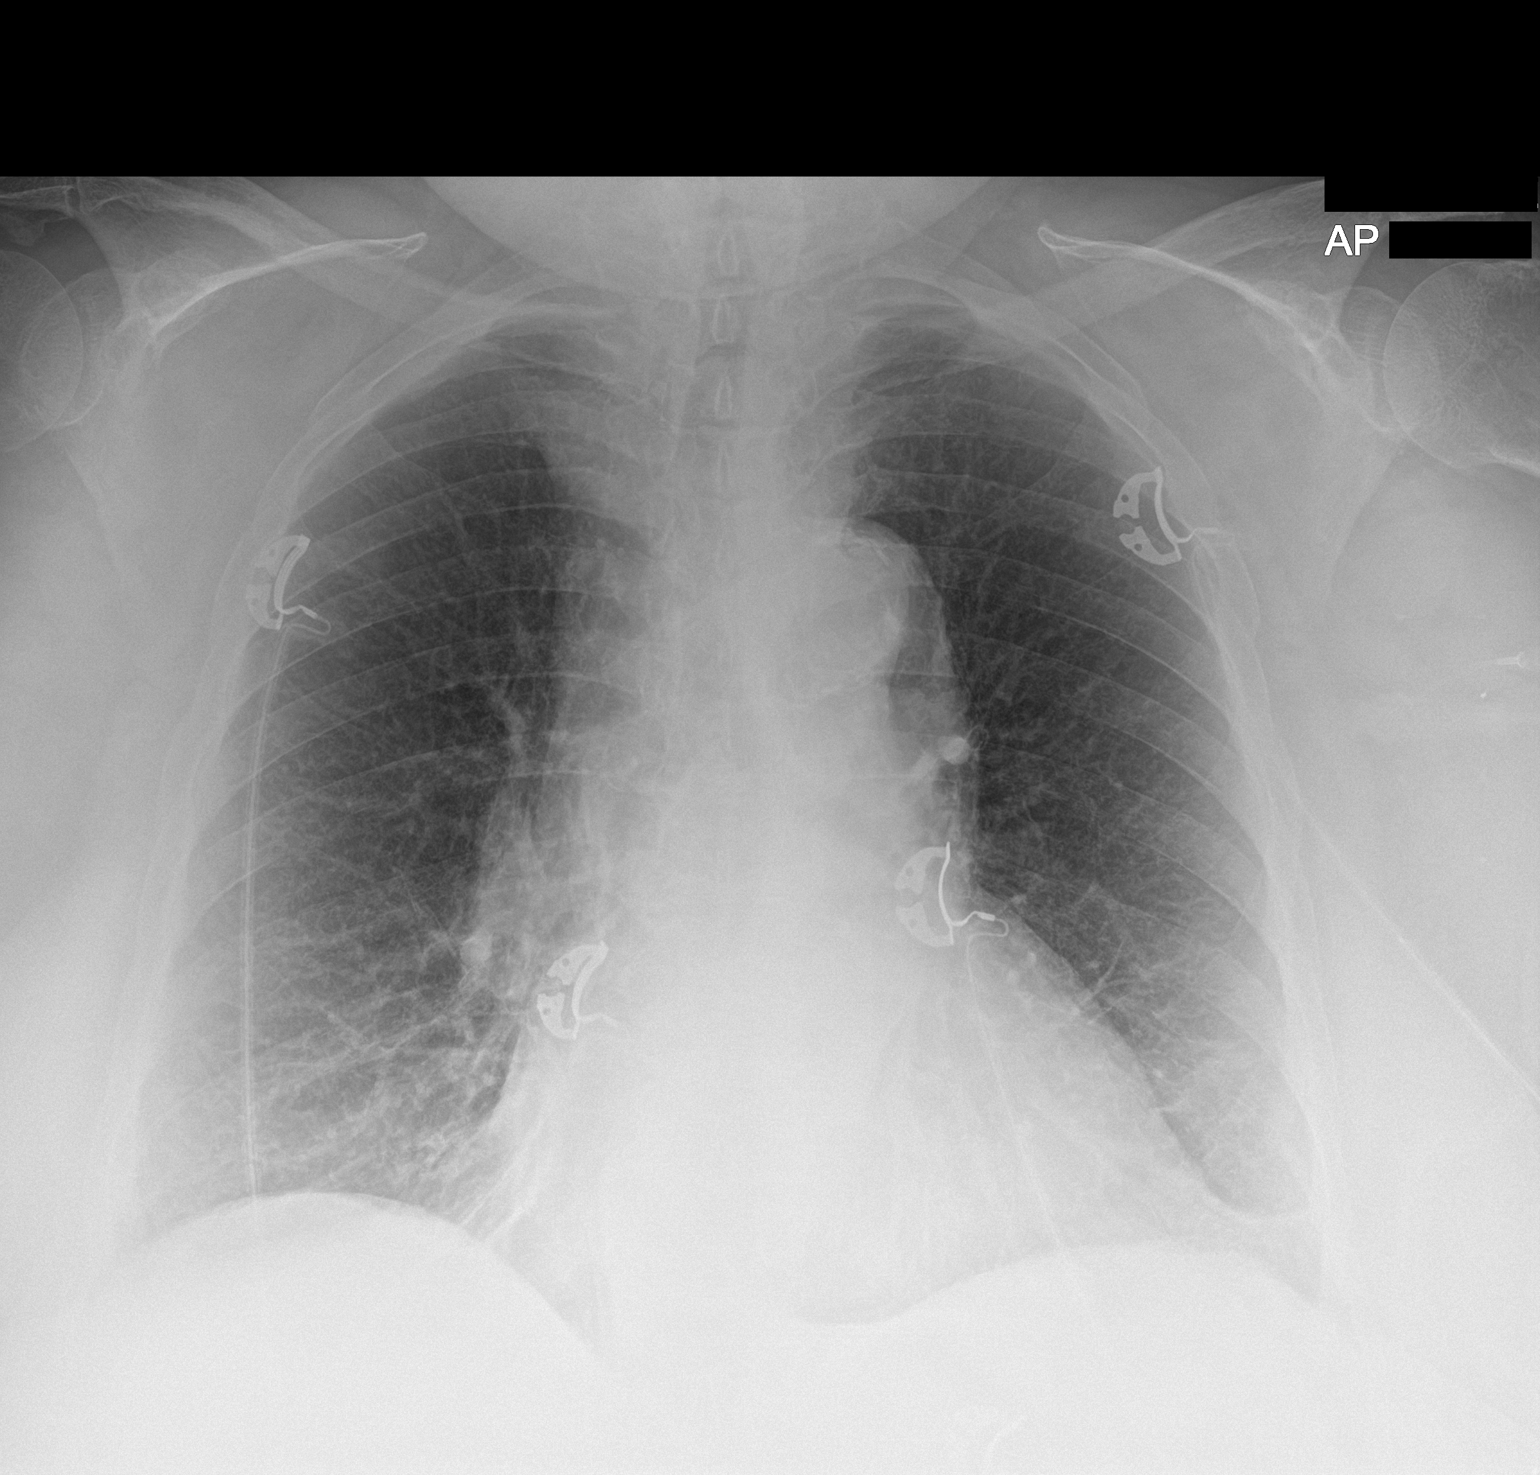

[1 of 1 positions shown; findings below may reference images not displayed]

FINDINGS: There is mild bibasilar atelectasis. There is no edema or
consolidation. Heart is enlarged with pulmonary vascularity normal.
There is aortic atherosclerosis. No evident adenopathy. No bone
lesions.
IMPRESSION: Mild bibasilar atelectasis. No edema or consolidation. Stable
cardiac prominence. There is aortic atherosclerosis.

Aortic Atherosclerosis (B876L-I18.8).

## 2020-04-15 IMAGING — CT CT ANGIO HEAD
3 of 7 series · 10 of 36 positions shown · IV contrast (APPLIED)
Comparison: Head CT same day

CLINICAL DATA: Speech disturbance beginning earlier today.

EXAM:
CT ANGIOGRAPHY HEAD AND NECK
TECHNIQUE: Multidetector CT imaging of the head and neck was performed using
the standard protocol during bolus administration of intravenous
contrast. Multiplanar CT image reconstructions and MIPs were
obtained to evaluate the vascular anatomy. Carotid stenosis
measurements (when applicable) are obtained utilizing NASCET
criteria, using the distal internal carotid diameter as the
denominator.
CONTRAST:  60mL [id number] IOPAMIDOL ([id number]) INJECTION 76%

[Series 4: cta head neck · axial · 0.43mm/px · z∈[-219,-117]mm · 2 of 153 slices shown]
[im 51/153  soft-tissue]
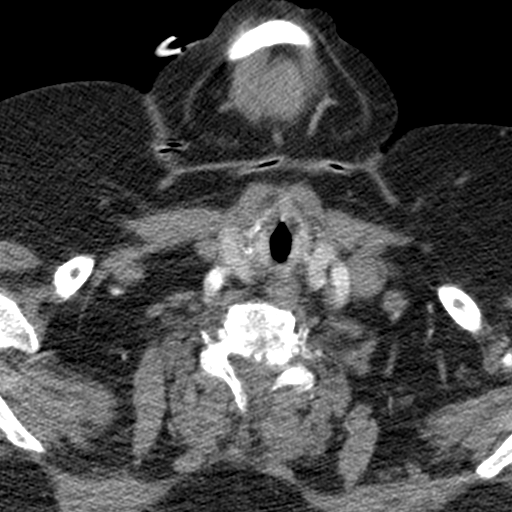
[im 102/153  soft-tissue]
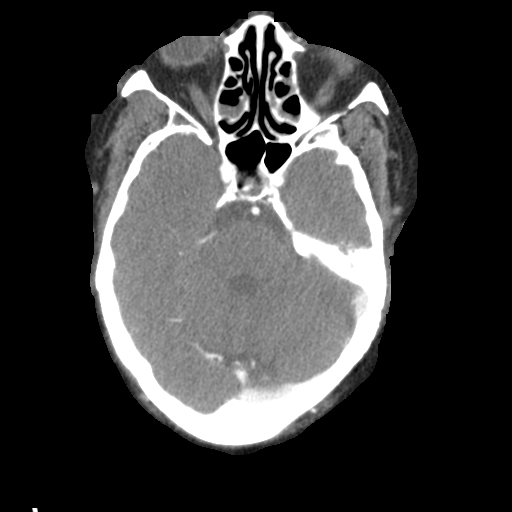

[Series 6: ax thin · axial · 0.39mm/px · z∈[-288,-60]mm · 6 of 321 slices shown]
[im 46/321  soft-tissue]
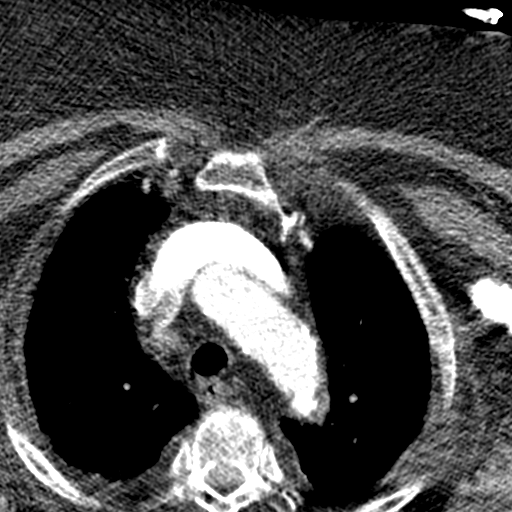
[im 92/321  bone]
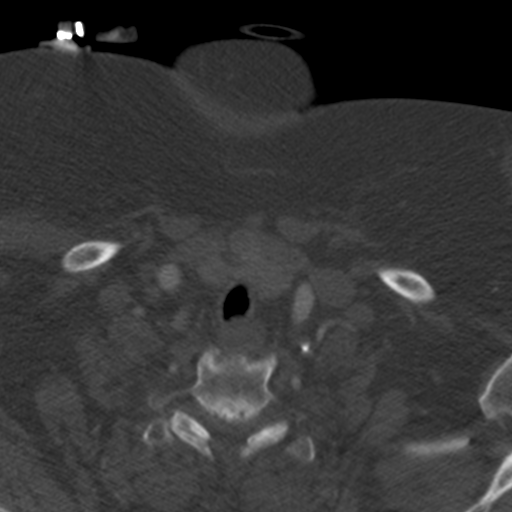
[im 138/321  soft-tissue]
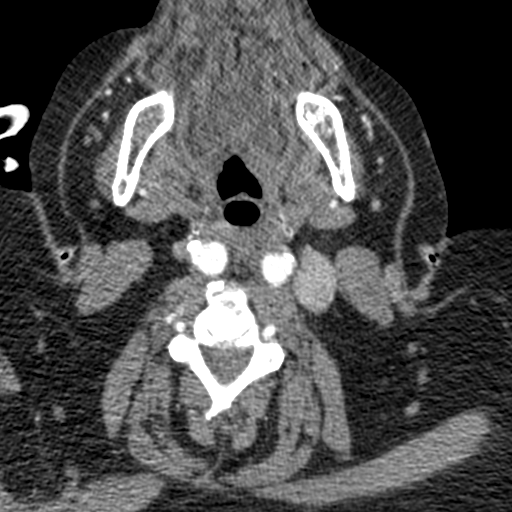
[im 183/321  bone]
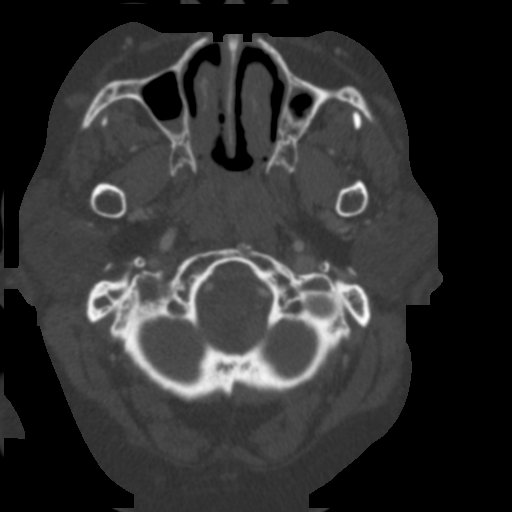
[im 229/321  soft-tissue]
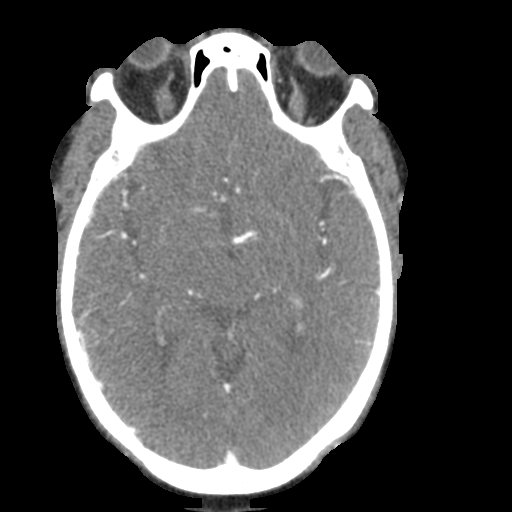
[im 275/321  bone]
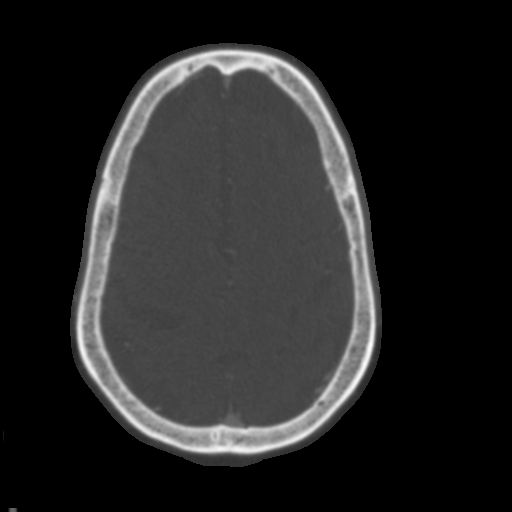

[Series 10: sagittal thin · sagittal · 0.39mm/px · 2 of 235 slices shown]
[im 50/235  soft-tissue]
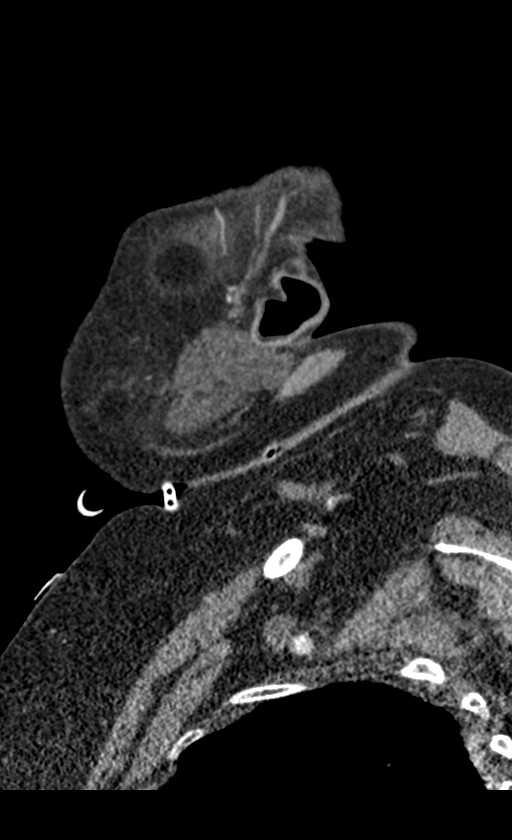
[im 186/235  soft-tissue]
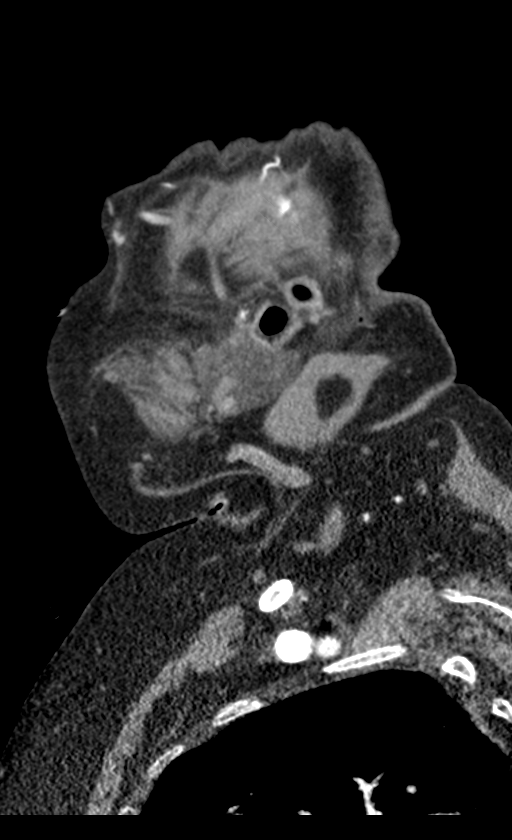

[10 of 36 positions shown; findings below may reference images not displayed]

FINDINGS: CTA NECK FINDINGS

Aortic arch: Aortic atherosclerosis. No dissection. No
brachiocephalic vessel origin stenosis.

Right carotid system: Common carotid artery is tortuous but widely
patent to bifurcation region. There is calcified plaque at the
carotid bifurcation and ICA bulb, but no stenosis. Cervical ICA is
tortuous but widely patent.

Left carotid system: Common carotid artery is tortuous but widely
patent to the bifurcation. There is calcified plaque at the carotid
bifurcation and ICA bulb but there is no stenosis. Cervical ICA is
tortuous but widely patent.

Vertebral arteries: Vertebral artery origins are patent. Vertebral
arteries are tortuous proximally. Vessels are widely patent through
the cervical region to the foramen magnum.

Skeleton: Cervical spondylosis.

Other neck: No mass or lymphadenopathy.

Upper chest: Negative

Review of the MIP images confirms the above findings

CTA HEAD FINDINGS

Anterior circulation: Both internal carotid arteries are patent
through the skull base and siphon regions. Mild siphon
atherosclerotic plaque but no stenosis. Anterior and middle cerebral
vessels are patent without proximal stenosis, aneurysm or vascular
malformation. No missing branch vessels are identified.

Posterior circulation: Both vertebral arteries are patent through
the foramen magnum to the basilar. No basilar stenosis. Posterior
circulation branch vessels are.

Venous sinuses: Patent and normal.

Anatomic variants: None significant.

Delayed phase: No abnormal enhancement.

Review of the MIP images confirms the above findings
IMPRESSION: Aortic atherosclerosis.

Brachiocephalic vessel tortuosity suggesting a history of
hypertension.

Atherosclerotic plaque at the carotid bifurcations and internal
carotid artery bulbs but no stenosis.

No intracranial stenosis or occlusion.

## 2020-04-15 IMAGING — CT CT HEAD CODE STROKE
3 series · 14 of 47 positions shown, 16 images · non-contrast
Comparison: MRI head June 28, 2017

CLINICAL DATA: Code stroke. Stroke-like symptoms. Speech
difficulties. History of hypertension.

EXAM:
CT HEAD WITHOUT CONTRAST
TECHNIQUE: Contiguous axial images were obtained from the base of the skull
through the vertex without intravenous contrast.

[Series 3: head wo · axial · 0.40mm/px · z∈[-79,+46]mm · 8 of 30 slices shown, 10 images]
[im 3/30  brain]
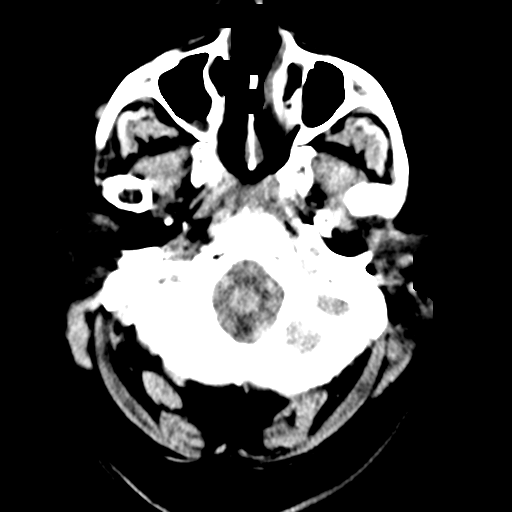
[im 3/30  bone]
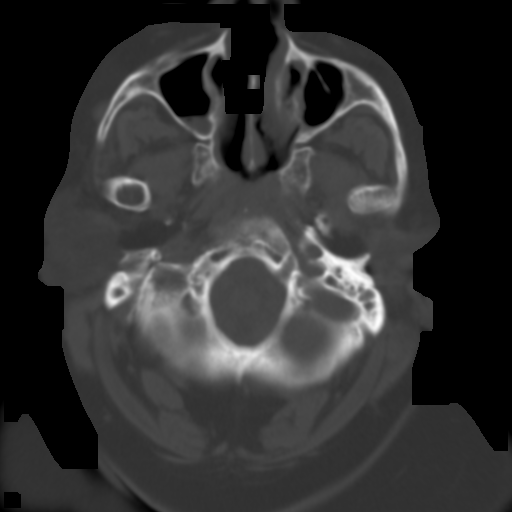
[im 7/30  brain]
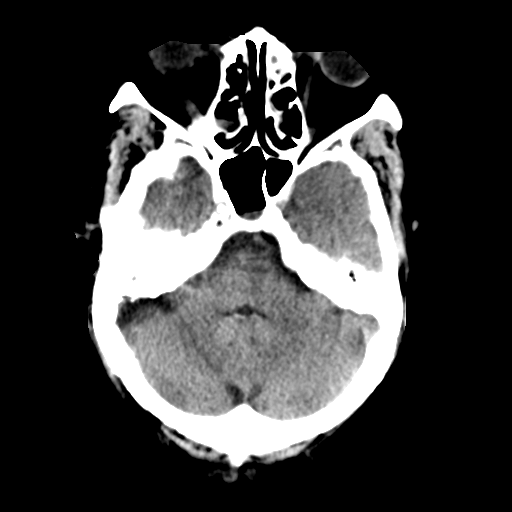
[im 10/30  brain]
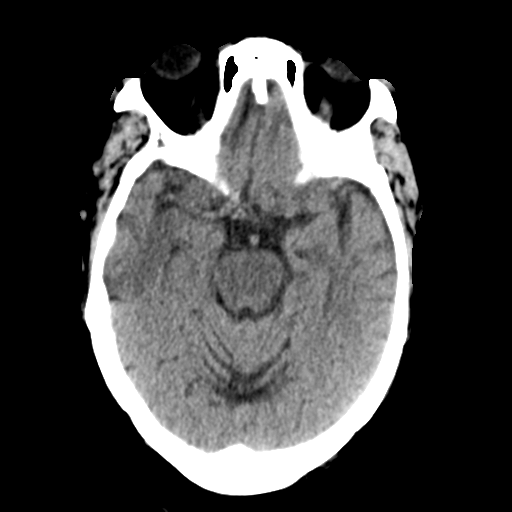
[im 14/30  brain]
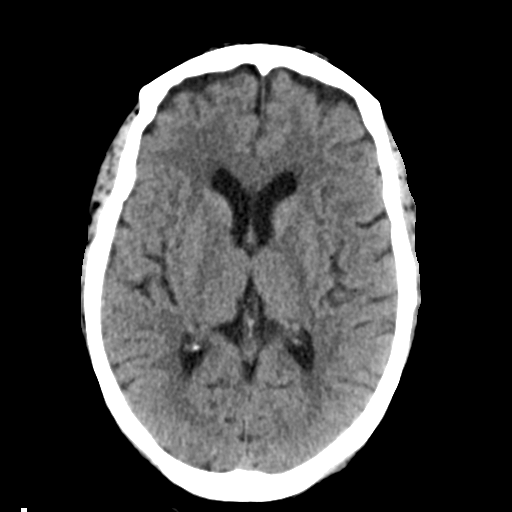
[im 17/30  brain]
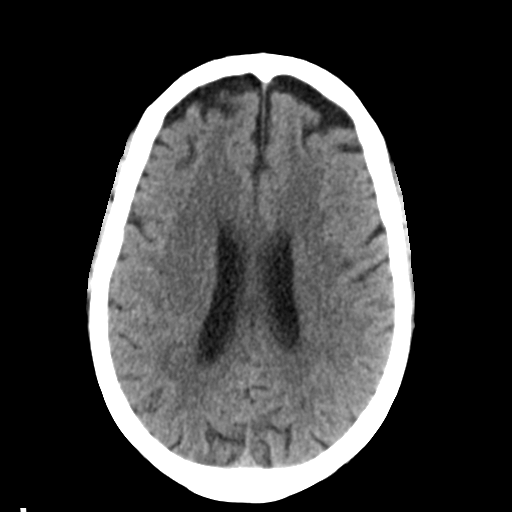
[im 17/30  bone]
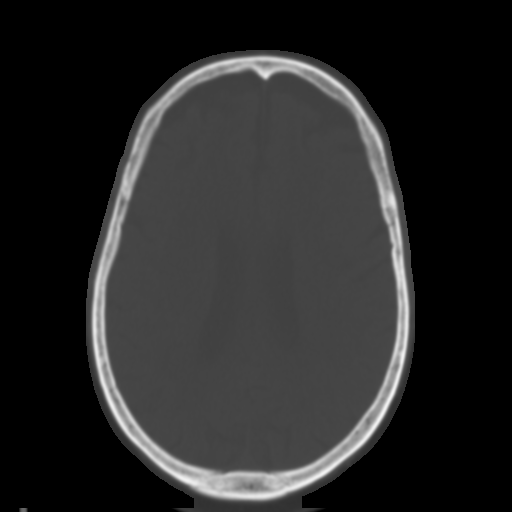
[im 21/30  brain]
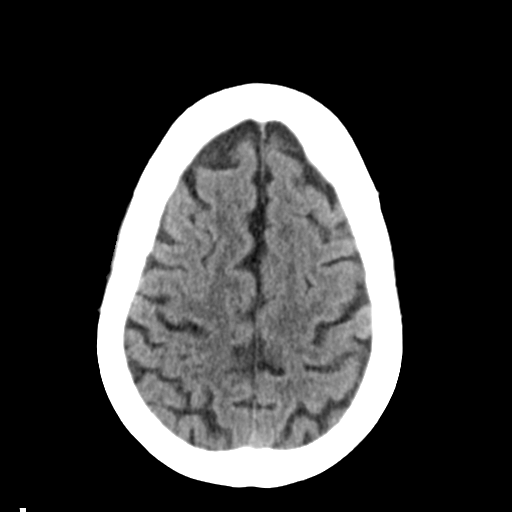
[im 24/30  brain]
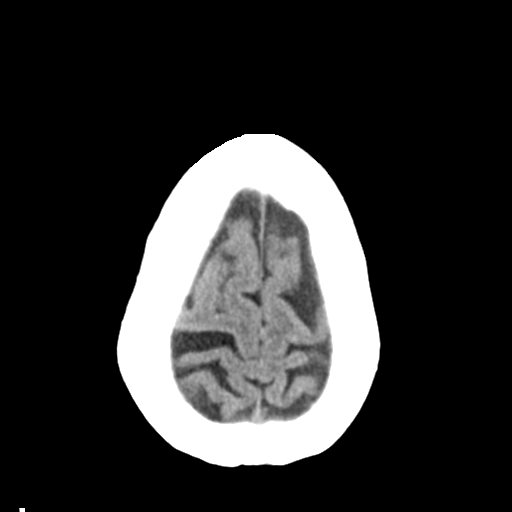
[im 28/30  brain]
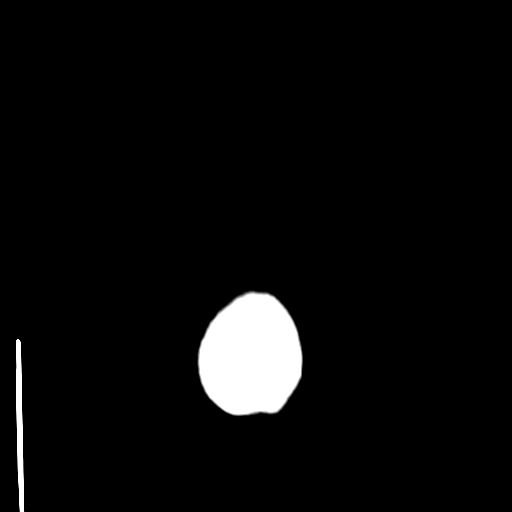

[Series 4: coronal soft tissue · coronal · 0.27mm/px · 3 of 63 slices shown]
[im 21/63  brain]
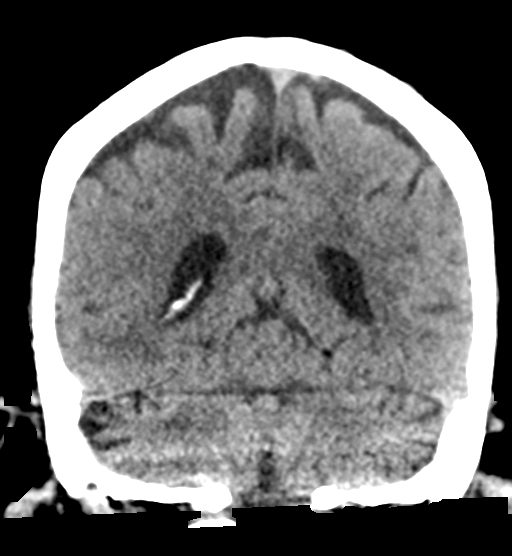
[im 28/63  brain]
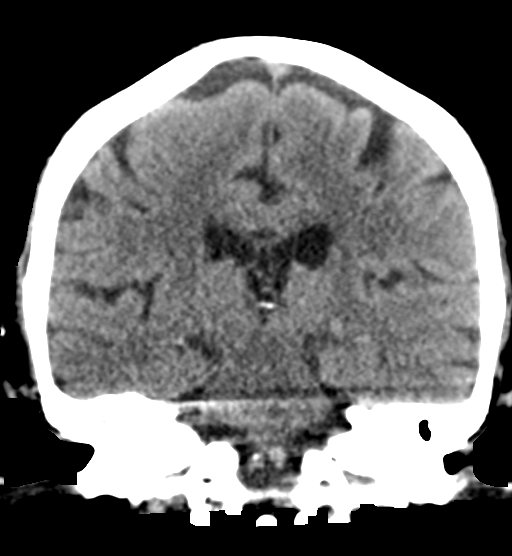
[im 35/63  brain]
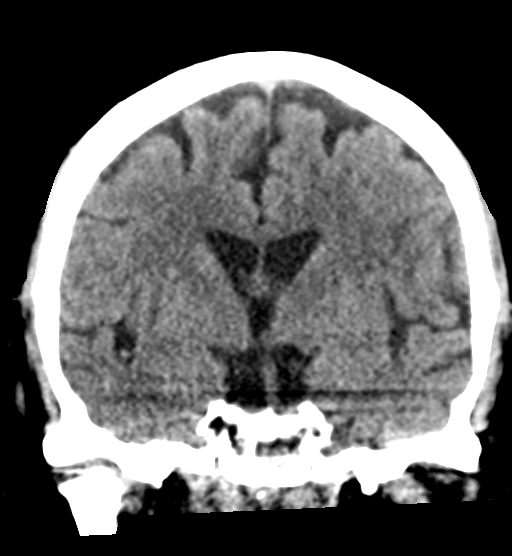

[Series 5: sagittal soft tissue · sagittal · 0.26mm/px · 3 of 47 slices shown]
[im 16/47  brain]
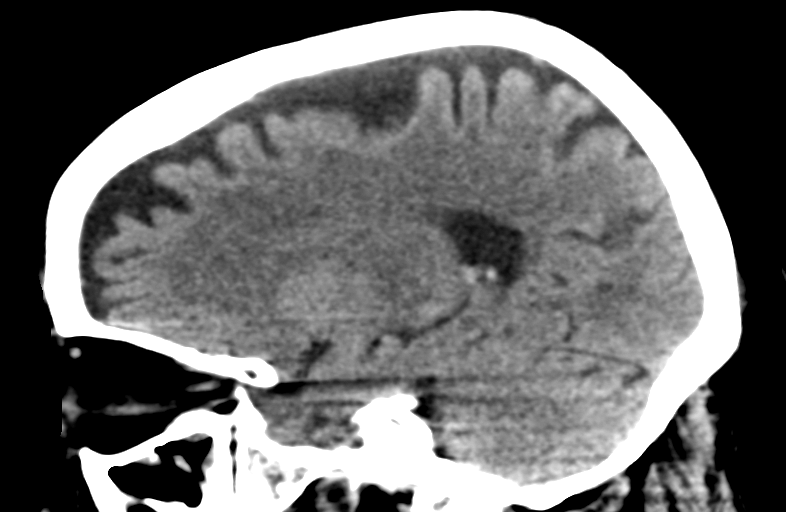
[im 24/47  brain]
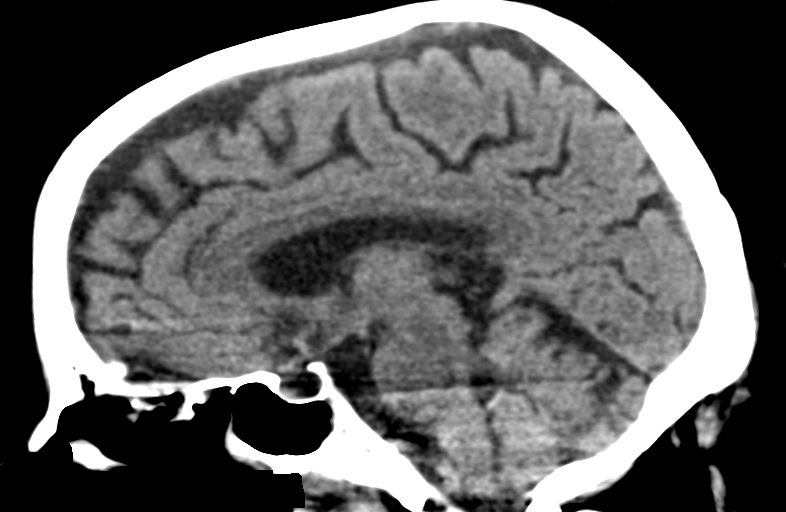
[im 31/47  brain]
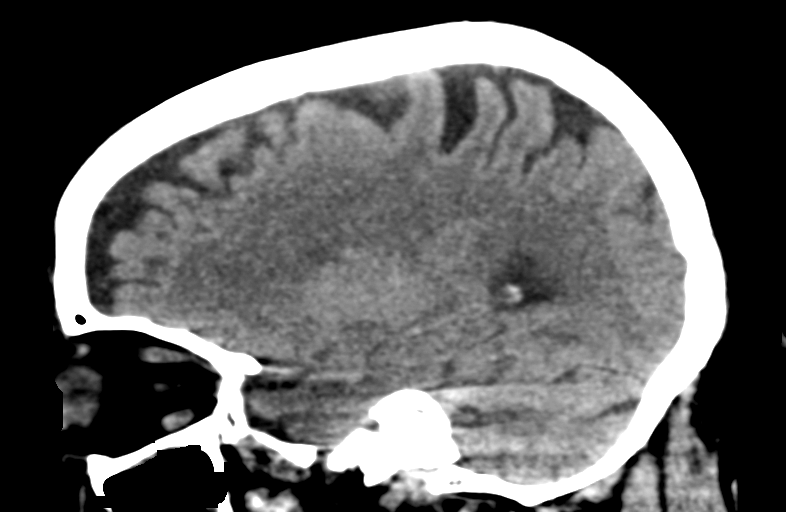

[14 of 47 positions shown; findings below may reference images not displayed]

FINDINGS: BRAIN: No intraparenchymal hemorrhage, mass effect nor midline
shift. The ventricles and sulci are normal for age. Patchy
supratentorial white matter hypodensities within normal range for
patient's age, though non-specific are most compatible with chronic
small vessel ischemic disease. Old RIGHT basal ganglia lacunar
infarct. No acute large vascular territory infarcts. No abnormal
extra-axial fluid collections. Basal cisterns are patent.

VASCULAR: Mild calcific atherosclerosis of the carotid siphons.

SKULL: No skull fracture. No significant scalp soft tissue swelling.

SINUSES/ORBITS: Moderate paranasal sinus mucosal thickening..
Bilateral mastoid effusions. Included ocular globes and orbital
contents are non-suspicious.

OTHER: None.

ASPECTS ([place]ber[place] Stroke Program Early CT Score)

- Ganglionic level infarction (caudate, lentiform nuclei, internal
capsule, insula, M1-M3 cortex): 7

- Supraganglionic infarction (M4-M6 cortex): 3

Total score (0-10 with 10 being normal): 10
IMPRESSION: 1. No acute intracranial process.
2. Mild-to-moderate chronic small vessel ischemic changes. Old
lacunar infarct.
3. ASPECTS is 10.
4. Dr. Maelalo paged at time of study interpretation, awaiting
return call.

## 2020-04-16 IMAGING — MR MR MRA HEAD W/O CM
10 of 12 series · 31 of 48 positions shown · non-contrast
Comparison: CT HEAD October 04, 2017 and MRI/MRA head June 28, 2017

CLINICAL DATA: Aphasia. History of hypertension and intracranial
aneurysm.

EXAM:
MRI HEAD WITHOUT CONTRAST
MRA HEAD WITHOUT CONTRAST
TECHNIQUE: Multiplanar, multiecho pulse sequences of the brain and surrounding
structures were obtained without intravenous contrast. Angiographic
images of the head were obtained using MRA technique without
contrast.

[Series 5: DWI · axial · 4.0mm · 0.88mm/px · z∈[-108,+23]mm · 4 of 68 slices shown (1 of 4)]
[im 1/68]
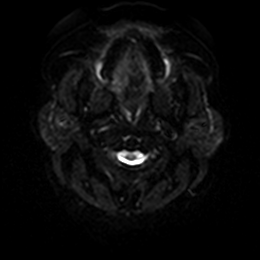
[im 23/68]
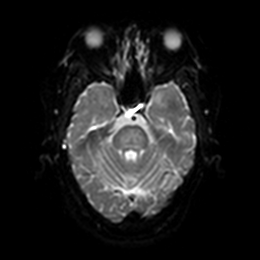
[im 45/68]
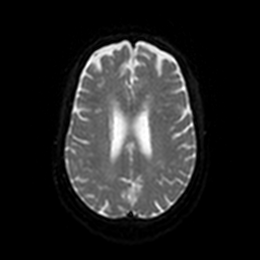
[im 68/68]
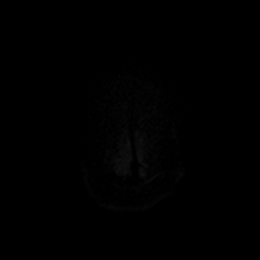

[Series 6: DWI · axial · 4.0mm · 0.88mm/px · z∈[-108,+23]mm · 3 of 34 slices shown (2 of 4)]
[im 1/34]
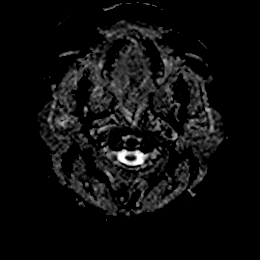
[im 17/34]
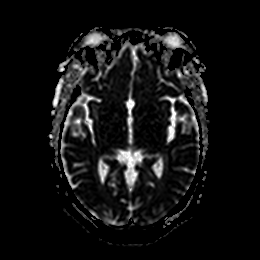
[im 34/34]
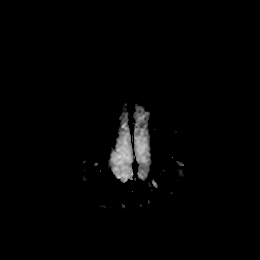

[Series 11: DWI · coronal · 4.0mm · 0.88mm/px · 5 of 68 slices shown (3 of 4)]
[im 1/68]
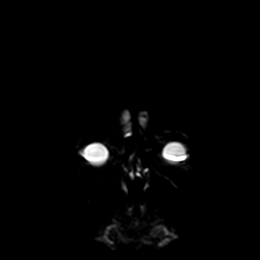
[im 17/68]
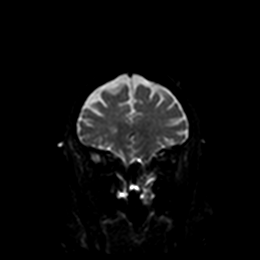
[im 34/68]
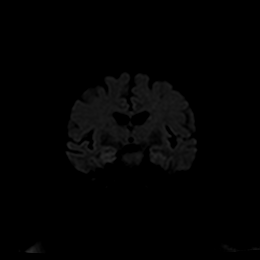
[im 51/68]
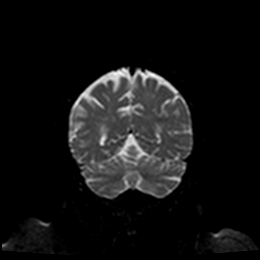
[im 68/68]
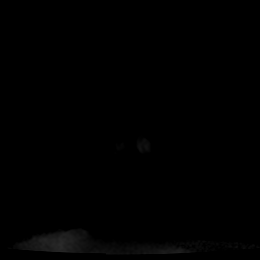

[Series 12: DWI · coronal · 4.0mm · 0.88mm/px · 3 of 34 slices shown (4 of 4)]
[im 1/34]
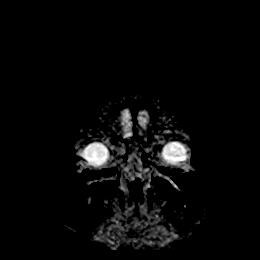
[im 17/34]
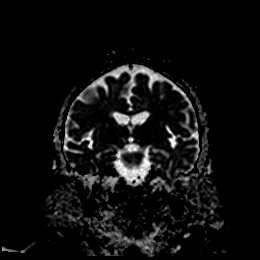
[im 34/34]
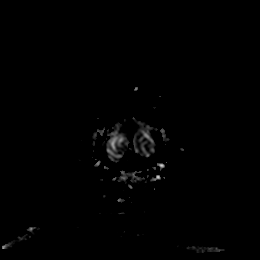

[Series 13: T1 · sagittal · 5.0mm · 0.75mm/px · 2 of 23 slices shown]
[im 1/23]
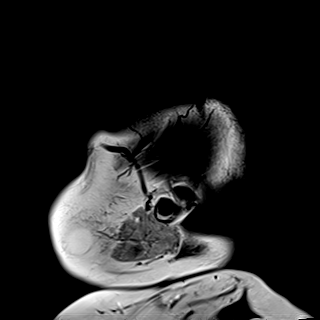
[im 23/23]
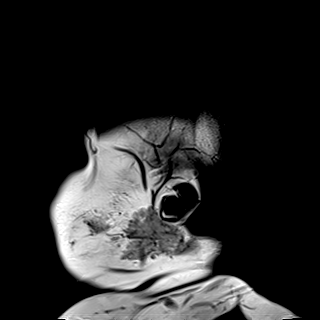

[Series 14: T2 · axial · 5.0mm · 0.72mm/px · z∈[-106,+26]mm · 2 of 23 slices shown (1 of 2)]
[im 1/23]
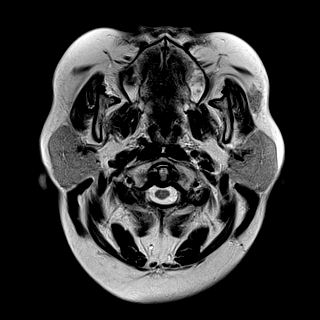
[im 23/23]
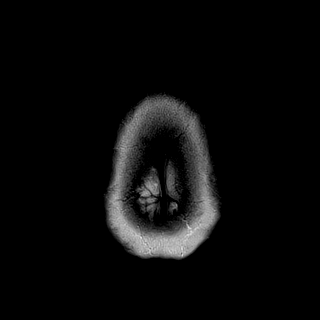

[Series 15: FLAIR · axial · 5.0mm · 0.45mm/px · z∈[-104,+27]mm · 2 of 23 slices shown]
[im 1/23]
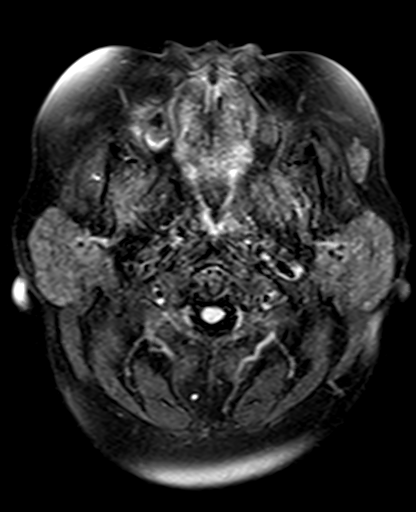
[im 23/23]
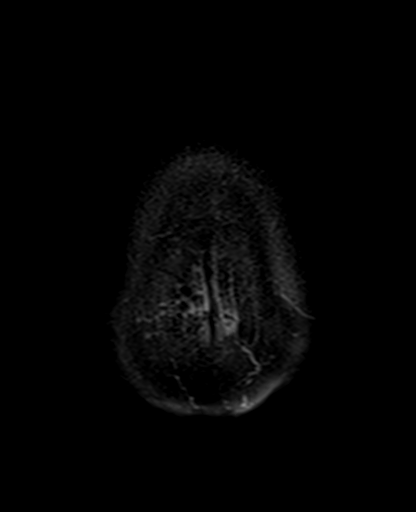

[Series 16: swi_images · axial · 3.0mm · 0.90mm/px · z∈[-121,+55]mm · 4 of 60 slices shown]
[im 1/60]
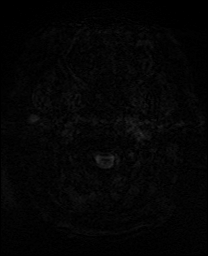
[im 20/60]
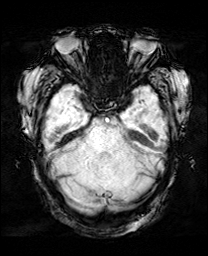
[im 40/60]
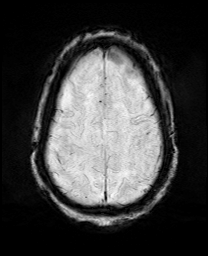
[im 60/60]
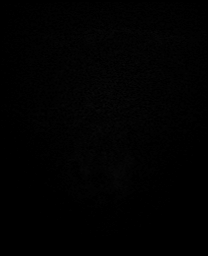

[Series 17: mip_images(sw) · axial · 24.0mm · 0.90mm/px · z∈[-111,+45]mm · 4 of 53 slices shown]
[im 1/53]
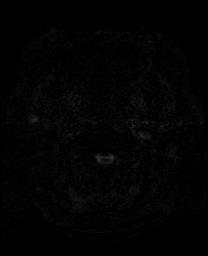
[im 18/53]
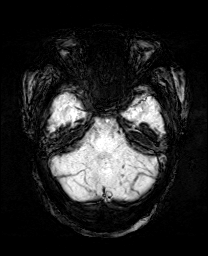
[im 35/53]
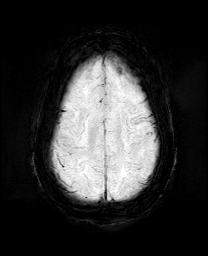
[im 53/53]
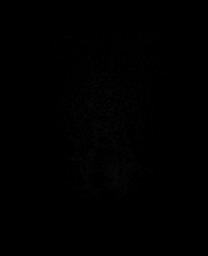

[Series 19: T2 · coronal · 5.0mm · 0.72mm/px · 2 of 28 slices shown (2 of 2)]
[im 1/28]
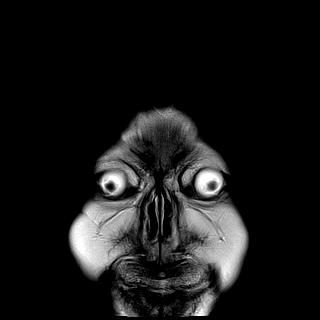
[im 28/28]
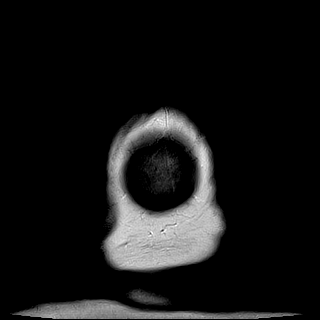

[31 of 48 positions shown; findings below may reference images not displayed]

FINDINGS: MRI HEAD FINDINGS-mild motion degraded examination.

INTRACRANIAL CONTENTS: No reduced diffusion to suggest acute
ischemia. No susceptibility artifact to suggest hemorrhage. The
ventricles and sulci are normal for patient's age. Scattered
supratentorial patchy pontine white matter FLAIR T2
hyperintensities. Old RIGHT basal ganglia infarct. No suspicious
parenchymal signal, masses, mass effect. No abnormal extra-axial
fluid collections. No extra-axial masses.

VASCULAR: Normal major intracranial vascular flow voids present at
skull base.

SKULL AND UPPER CERVICAL SPINE: No abnormal sellar expansion.
Potentially dehiscent sellar floor. No suspicious calvarial bone
marrow signal. Craniocervical junction maintained.

SINUSES/ORBITS: Paranasal sinus mucosal thickening with sphenoid
sinus air-fluid level. Bilateral mastoid effusions.The included
ocular globes and orbital contents are non-suspicious.

OTHER: Patient is edentulous.

MRA HEAD FINDINGS

ANTERIOR CIRCULATION: Normal flow related enhancement of the
included cervical, petrous, cavernous and supraclinoid internal
carotid arteries. Mild stenosis LEFT paraclinoid ICA. Patent
anterior communicating artery. 4 mm superiorly directed A-comm
aneurysm. Patent anterior and middle cerebral arteries, mild luminal
irregularity compatible with atherosclerosis.

No large vessel occlusion, flow limiting stenosis.

POSTERIOR CIRCULATION: Codominant vertebral arteries.
Vertebrobasilar arteries are patent, with normal flow related
enhancement of the main branch vessels. Patent posterior cerebral
arteries, mild luminal irregularity compatible with atherosclerosis.

No large vessel occlusion, flow limiting stenosis,  aneurysm.

ANATOMIC VARIANTS: Supernumerary anterior cerebral artery.

Source images and MIP images were reviewed.
IMPRESSION: MRI HEAD:

1. No acute intracranial process.
2. Mild-to-moderate chronic small vessel ischemic changes. Old RIGHT
basal ganglia lacunar infarct.
3. Worsening paranasal sinusitis and mastoid effusions.

MRA HEAD:

1. No emergent large vessel occlusion or flow-limiting stenosis.
2. Mild intracranial atherosclerosis.
3. 4 mm A-comm aneurysm. Neuro-Interventional Radiology consultation
is suggested to evaluate the appropriateness of potential treatment.
Non-emergent evaluation can be arranged by calling [phone]
during usual hours.

## 2020-05-04 IMAGING — CT CT HEAD W/O CM
3 series · 14 of 46 positions shown, 16 images · non-contrast
Comparison: 10/04/2017

CLINICAL DATA: Altered mental status, shortness of breath, fever
102.2 degrees, oxygen desaturation, history stage III chronic kidney
disease, hypertension, former smoker, COPD, GERD

EXAM:
CT HEAD WITHOUT CONTRAST
TECHNIQUE: Contiguous axial images were obtained from the base of the skull
through the vertex without intravenous contrast. Sagittal and
coronal MPR images reconstructed from axial data set.

[Series 3: head wo · axial · 0.43mm/px · z∈[-64,+56]mm · 8 of 29 slices shown, 10 images]
[im 3/29  brain]
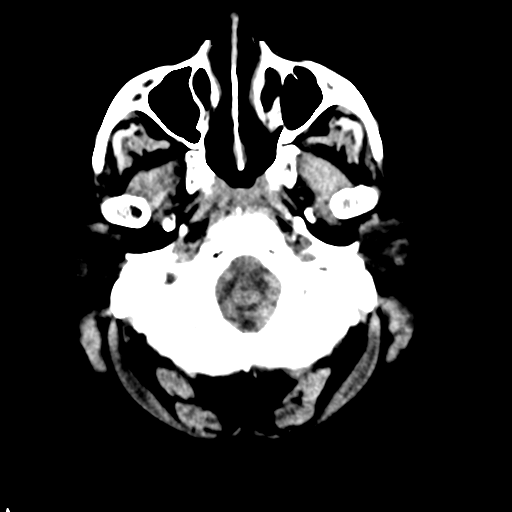
[im 3/29  bone]
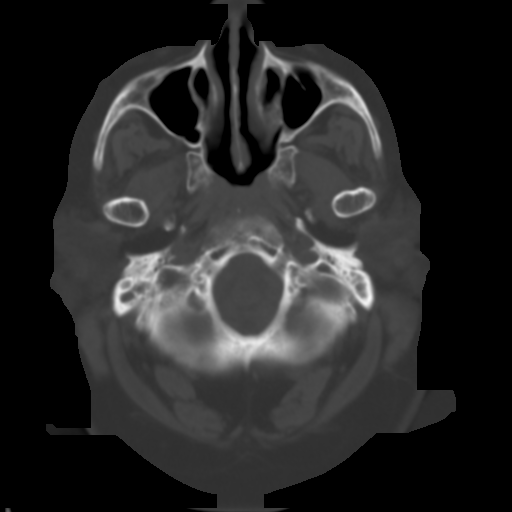
[im 7/29  brain]
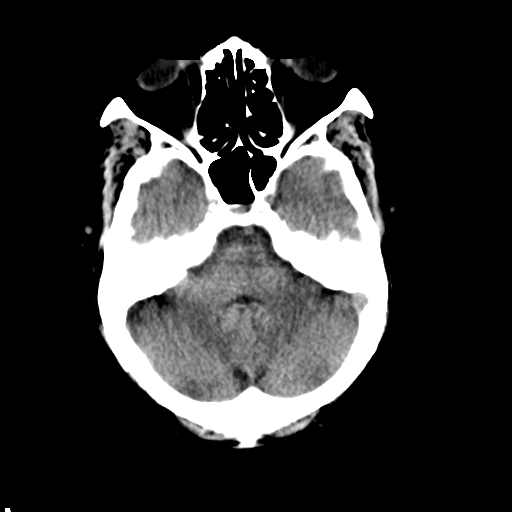
[im 10/29  brain]
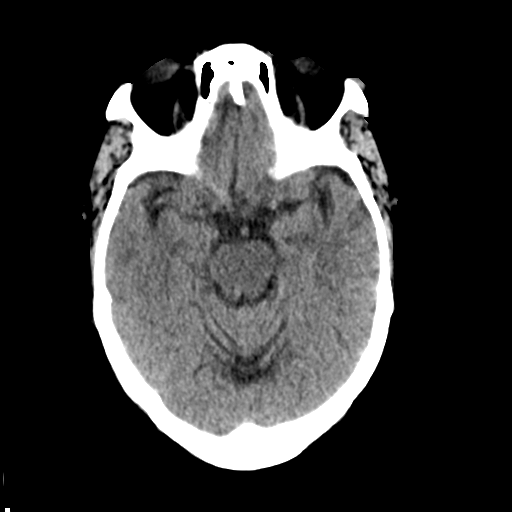
[im 13/29  brain]
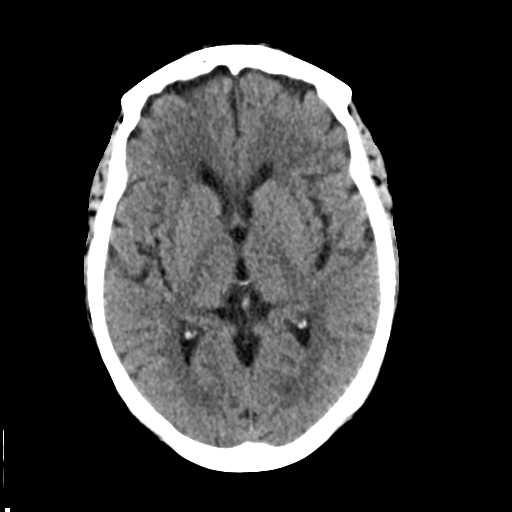
[im 17/29  brain]
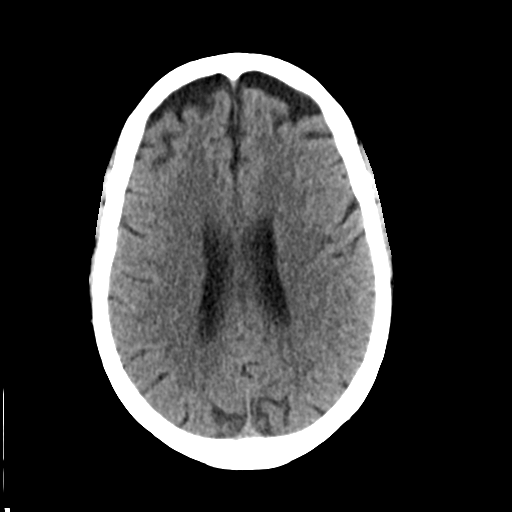
[im 17/29  bone]
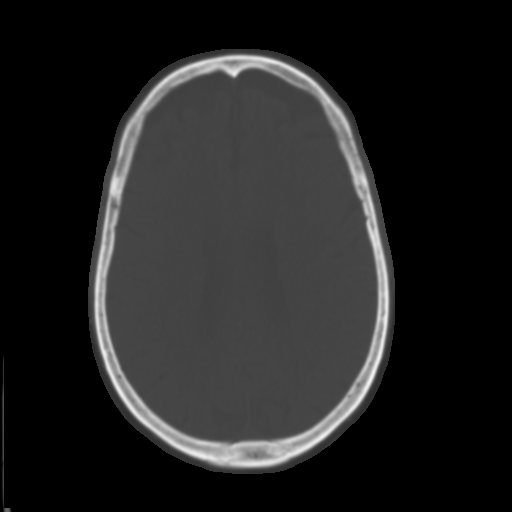
[im 20/29  brain]
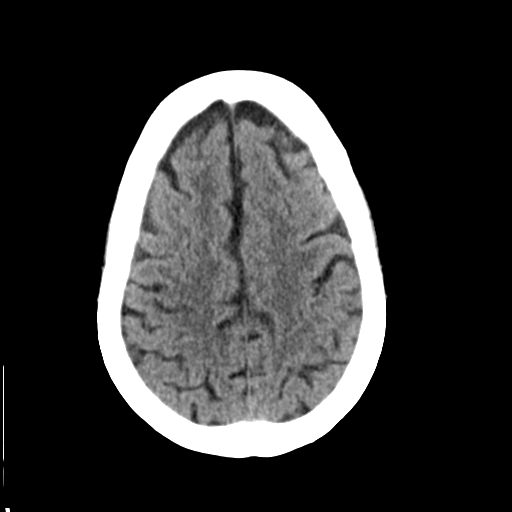
[im 23/29  brain]
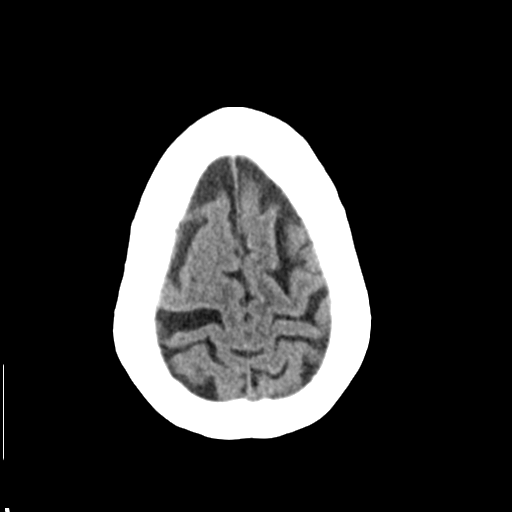
[im 27/29  brain]
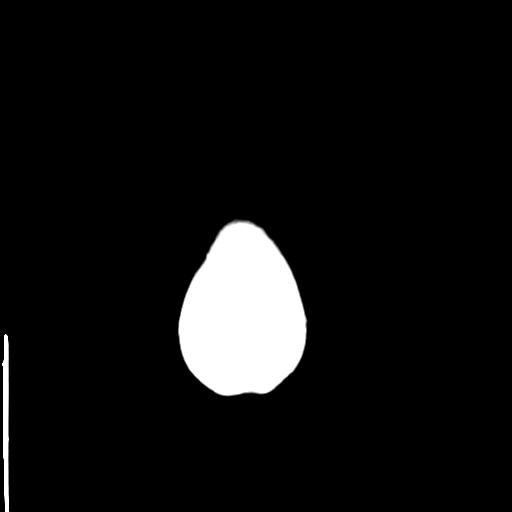

[Series 4: coronal soft tissue · coronal · 0.28mm/px · 3 of 62 slices shown]
[im 21/62  brain]
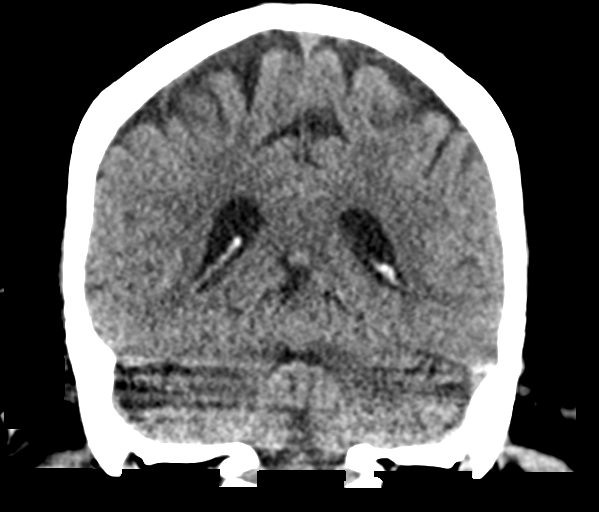
[im 28/62  brain]
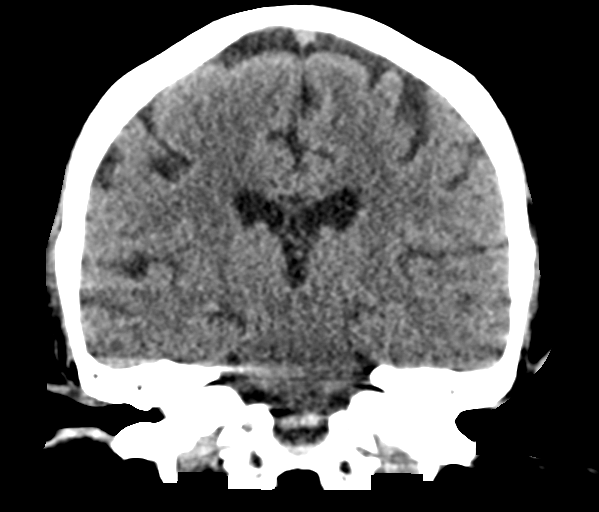
[im 34/62  brain]
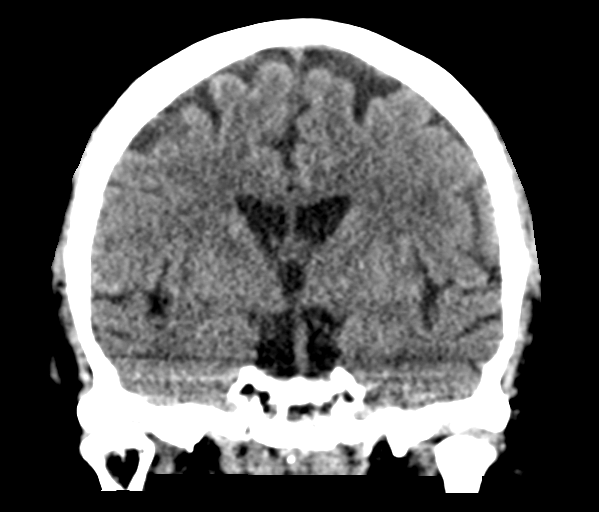

[Series 5: sagittal soft tissue · sagittal · 0.28mm/px · 3 of 47 slices shown]
[im 16/47  brain]
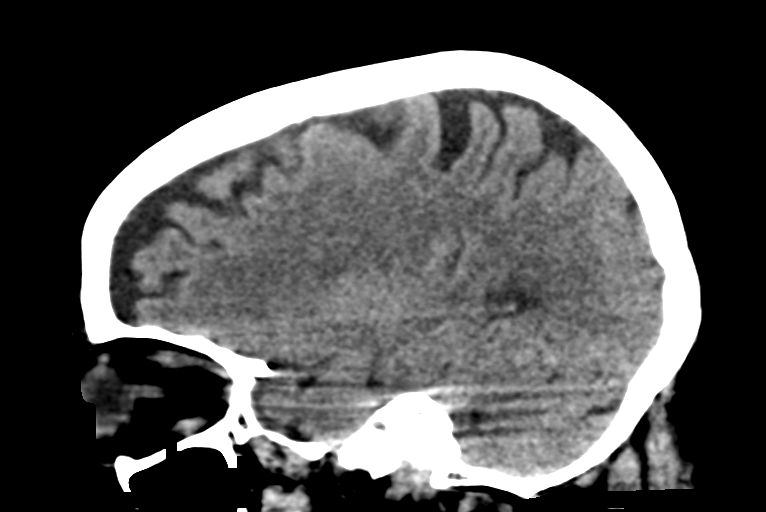
[im 24/47  brain]
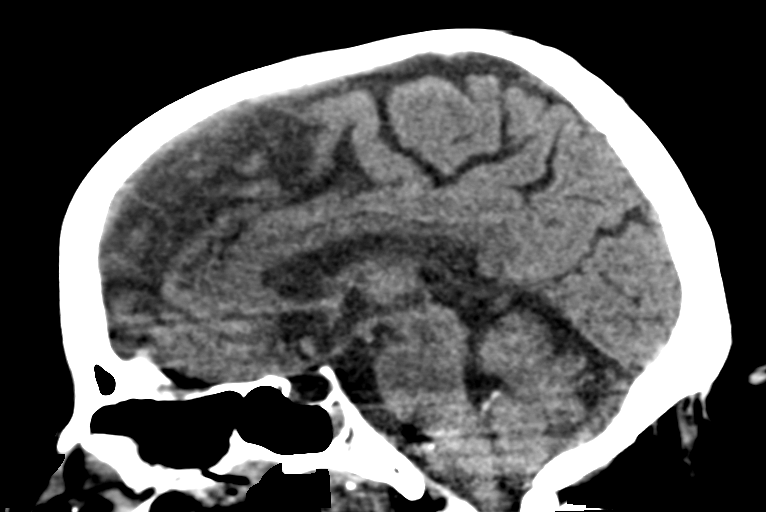
[im 31/47  brain]
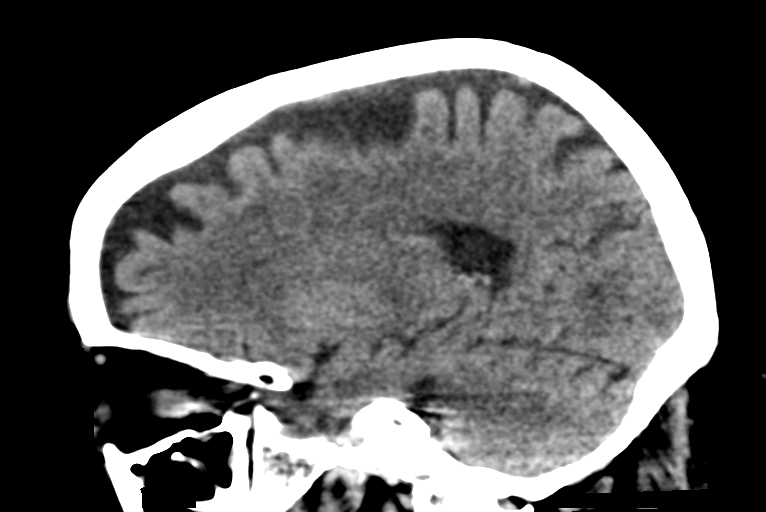

[14 of 46 positions shown; findings below may reference images not displayed]

FINDINGS: Brain: Generalized atrophy. Normal ventricular morphology. No
midline shift or mass effect. Small vessel chronic ischemic changes
of deep cerebral white matter. No intracranial hemorrhage, mass
lesion, evidence of acute infarction, or extra-axial fluid
collection.

Vascular: No hyperdense vessels

Skull: Intact

Sinuses/Orbits: Small air-fluid level within sphenoid sinus.
BILATERAL chronic mastoid opacification bilaterally with fluid in
the middle ear cavities bilaterally as well.

Other: N/A
IMPRESSION: Atrophy with small vessel chronic ischemic changes of deep cerebral
white matter.

No acute intracranial abnormalities.

Chronic fluid opacification of BILATERAL mastoid air cells and
middle ear cavities.

## 2020-05-05 DIAGNOSIS — N184 Chronic kidney disease, stage 4 (severe): Secondary | ICD-10-CM | POA: Diagnosis not present

## 2020-05-05 DIAGNOSIS — M1712 Unilateral primary osteoarthritis, left knee: Secondary | ICD-10-CM | POA: Diagnosis not present

## 2020-05-05 DIAGNOSIS — F330 Major depressive disorder, recurrent, mild: Secondary | ICD-10-CM | POA: Diagnosis not present

## 2020-05-05 DIAGNOSIS — I519 Heart disease, unspecified: Secondary | ICD-10-CM | POA: Diagnosis not present

## 2020-05-05 DIAGNOSIS — E1122 Type 2 diabetes mellitus with diabetic chronic kidney disease: Secondary | ICD-10-CM | POA: Diagnosis not present

## 2020-05-05 DIAGNOSIS — I5032 Chronic diastolic (congestive) heart failure: Secondary | ICD-10-CM | POA: Diagnosis not present

## 2020-05-05 DIAGNOSIS — G40909 Epilepsy, unspecified, not intractable, without status epilepticus: Secondary | ICD-10-CM | POA: Diagnosis not present

## 2020-05-05 DIAGNOSIS — G4733 Obstructive sleep apnea (adult) (pediatric): Secondary | ICD-10-CM | POA: Diagnosis not present

## 2020-05-05 DIAGNOSIS — J9611 Chronic respiratory failure with hypoxia: Secondary | ICD-10-CM | POA: Diagnosis not present

## 2020-05-07 IMAGING — CT CT HEAD W/O CM
3 series · 14 of 45 positions shown, 16 images · non-contrast
Comparison: 10/23/2017

CLINICAL DATA: AMS. Acute onset of expressive aphasia, desaturation
and inability to follow commands, rapid response called.

EXAM:
CT HEAD WITHOUT CONTRAST
TECHNIQUE: Contiguous axial images were obtained from the base of the skull
through the vertex without intravenous contrast.

[Series 2: head wo · axial · 0.41mm/px · z∈[-132,-17]mm · 8 of 28 slices shown, 10 images]
[im 3/28  brain]
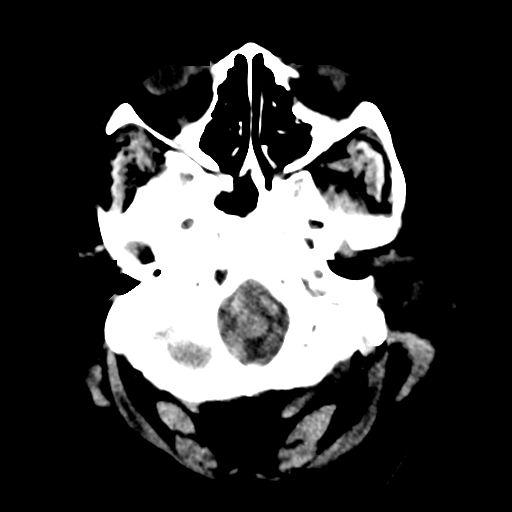
[im 3/28  bone]
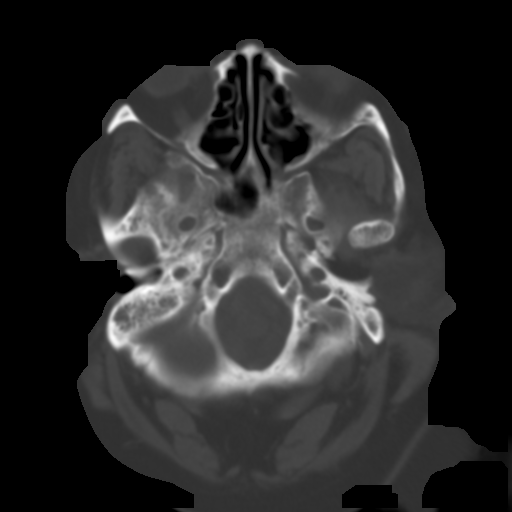
[im 6/28  brain]
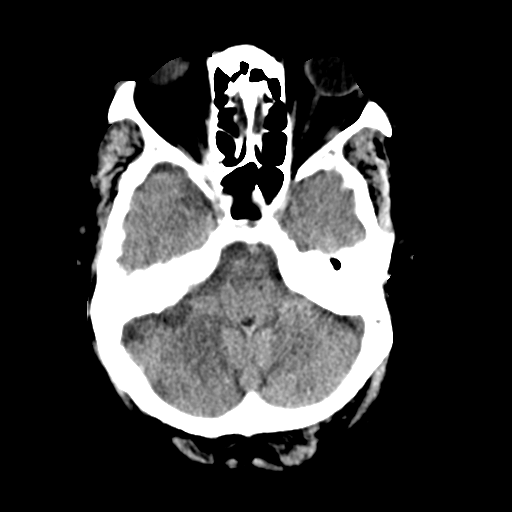
[im 10/28  brain]
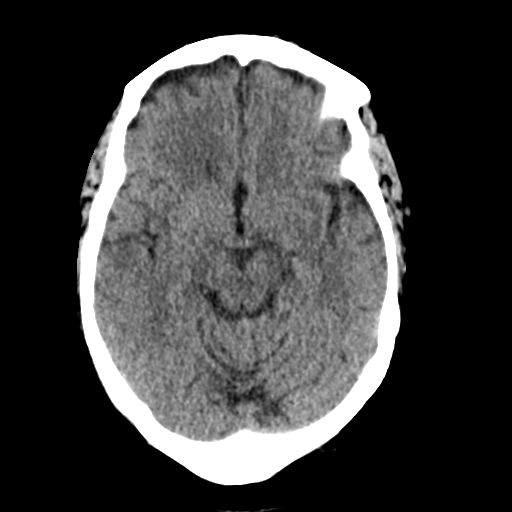
[im 13/28  brain]
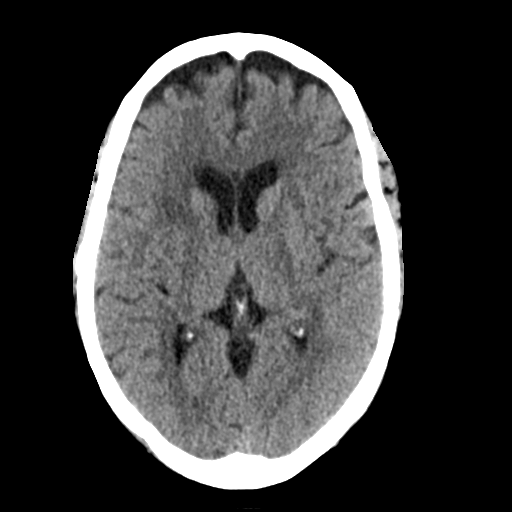
[im 16/28  brain]
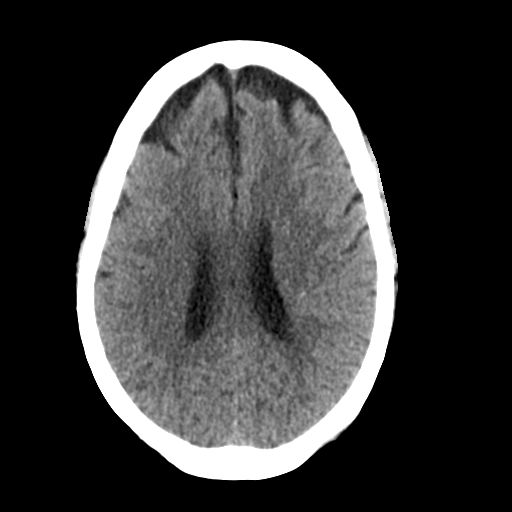
[im 16/28  bone]
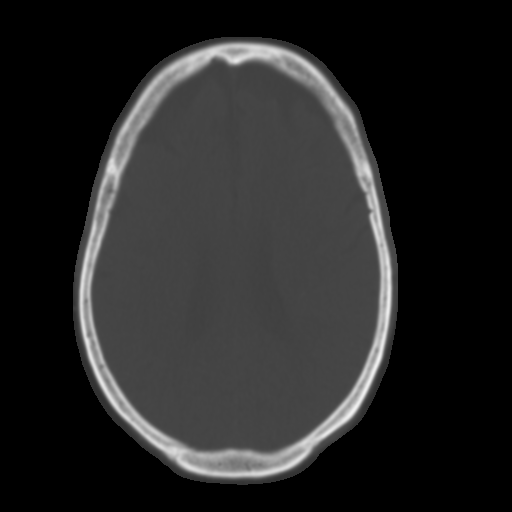
[im 19/28  brain]
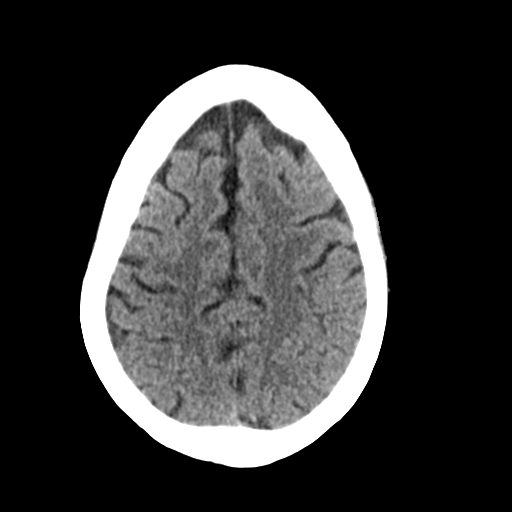
[im 23/28  brain]
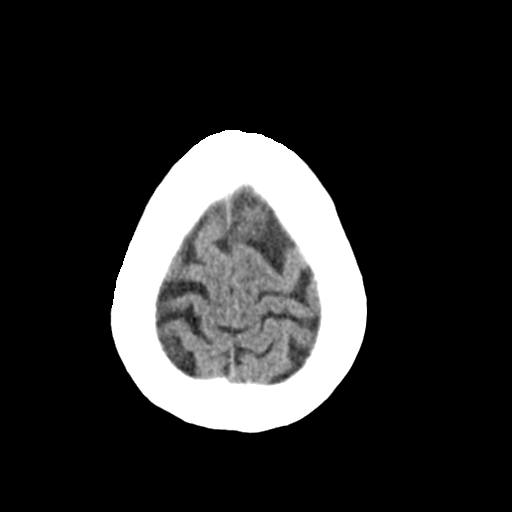
[im 26/28  brain]
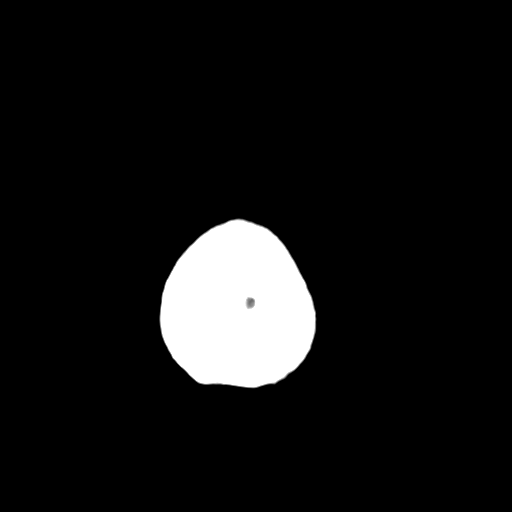

[Series 4: coronal soft tissue · coronal · 0.29mm/px · 3 of 65 slices shown]
[im 22/65  brain]
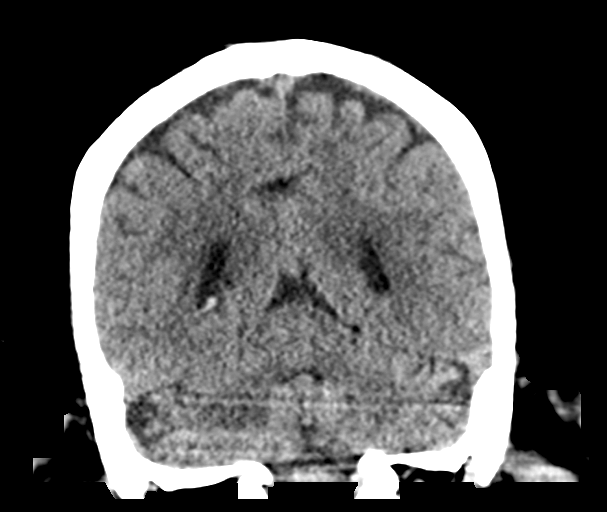
[im 29/65  brain]
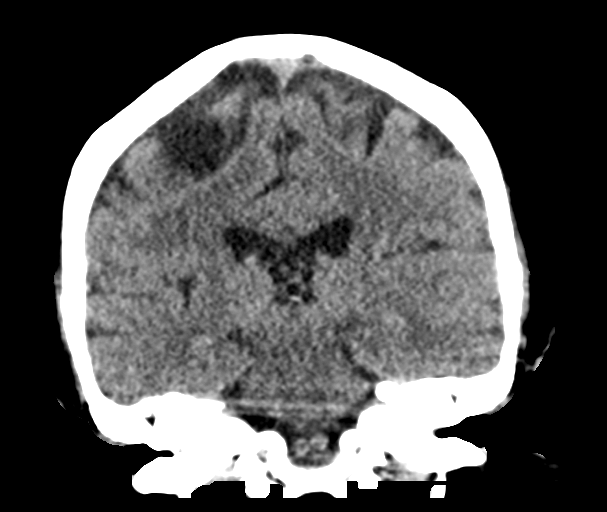
[im 36/65  brain]
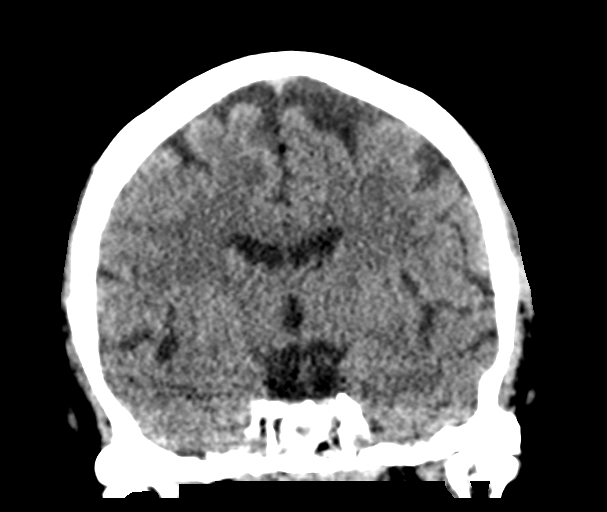

[Series 5: sagittal soft tissue · sagittal · 0.29mm/px · 3 of 48 slices shown]
[im 16/48  brain]
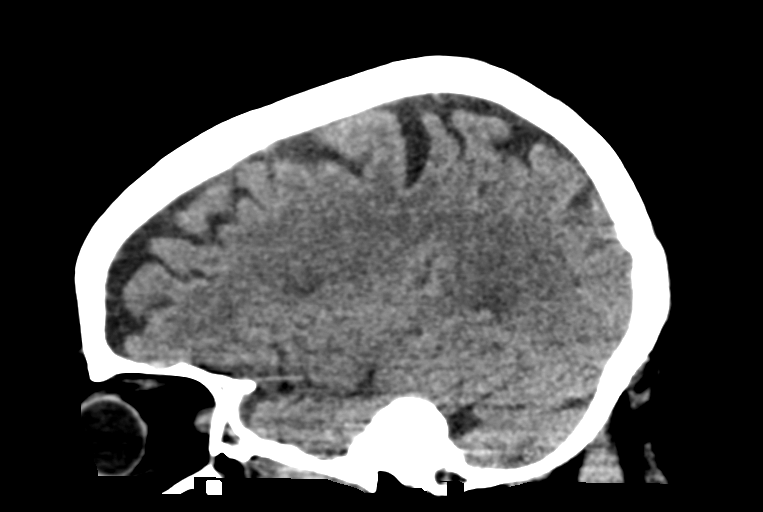
[im 24/48  brain]
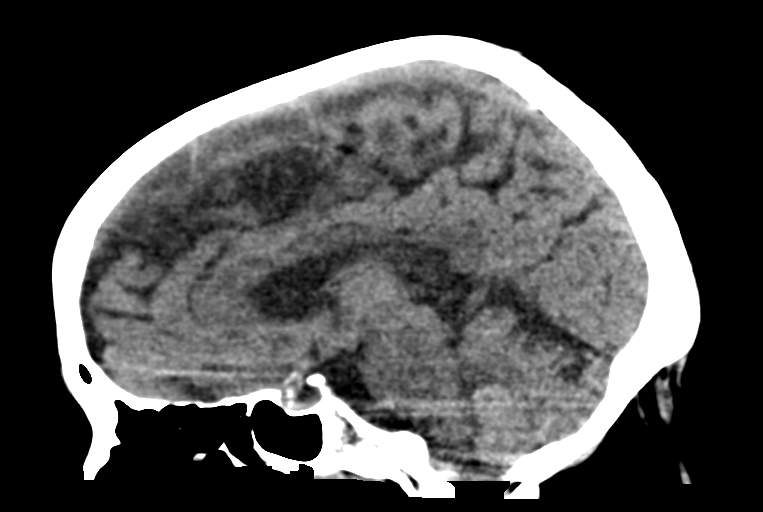
[im 32/48  brain]
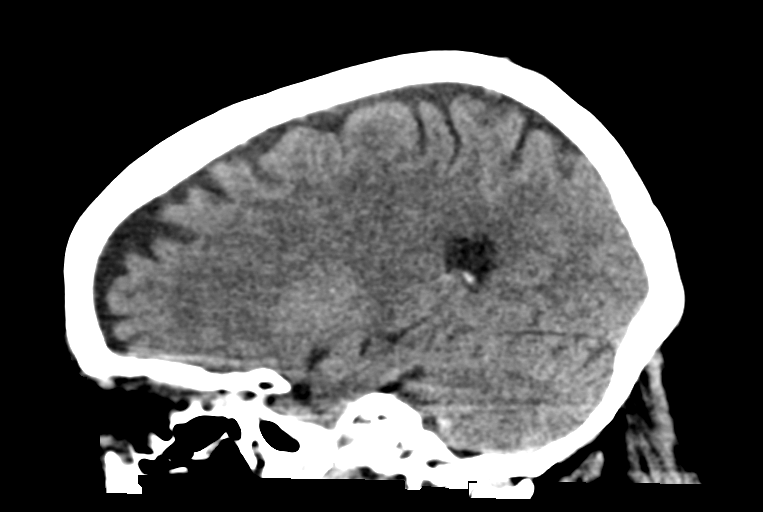

[14 of 45 positions shown; findings below may reference images not displayed]

FINDINGS: Brain: Diffuse parenchymal atrophy. Patchy areas of hypoattenuation
in deep and periventricular white matter bilaterally. Negative for
acute intracranial hemorrhage, mass lesion, acute infarction,
midline shift, or mass-effect. Acute infarct may be inapparent on
noncontrast CT. Ventricles and sulci symmetric.

Vascular: No hyperdense vessel or unexpected calcification.

Skull: Normal. Negative for fracture or focal lesion.

Sinuses/Orbits: Opacification of mastoid air cells bilaterally.
Fluid level in the sphenoid sinus.

Other: None.
IMPRESSION: 1. Negative for bleed or other acute intracranial process.
2. Atrophy and nonspecific white matter changes.
3. Chronic opacification of bilateral mastoid air cells.

## 2020-05-07 IMAGING — CR DG CHEST 1V PORT
1 series · 1 of 1 positions shown · non-contrast
Comparison: 10/23/2017

CLINICAL DATA: Altered mental status.  Possible seizure.

EXAM:
PORTABLE CHEST 1 VIEW

[x chest ap]
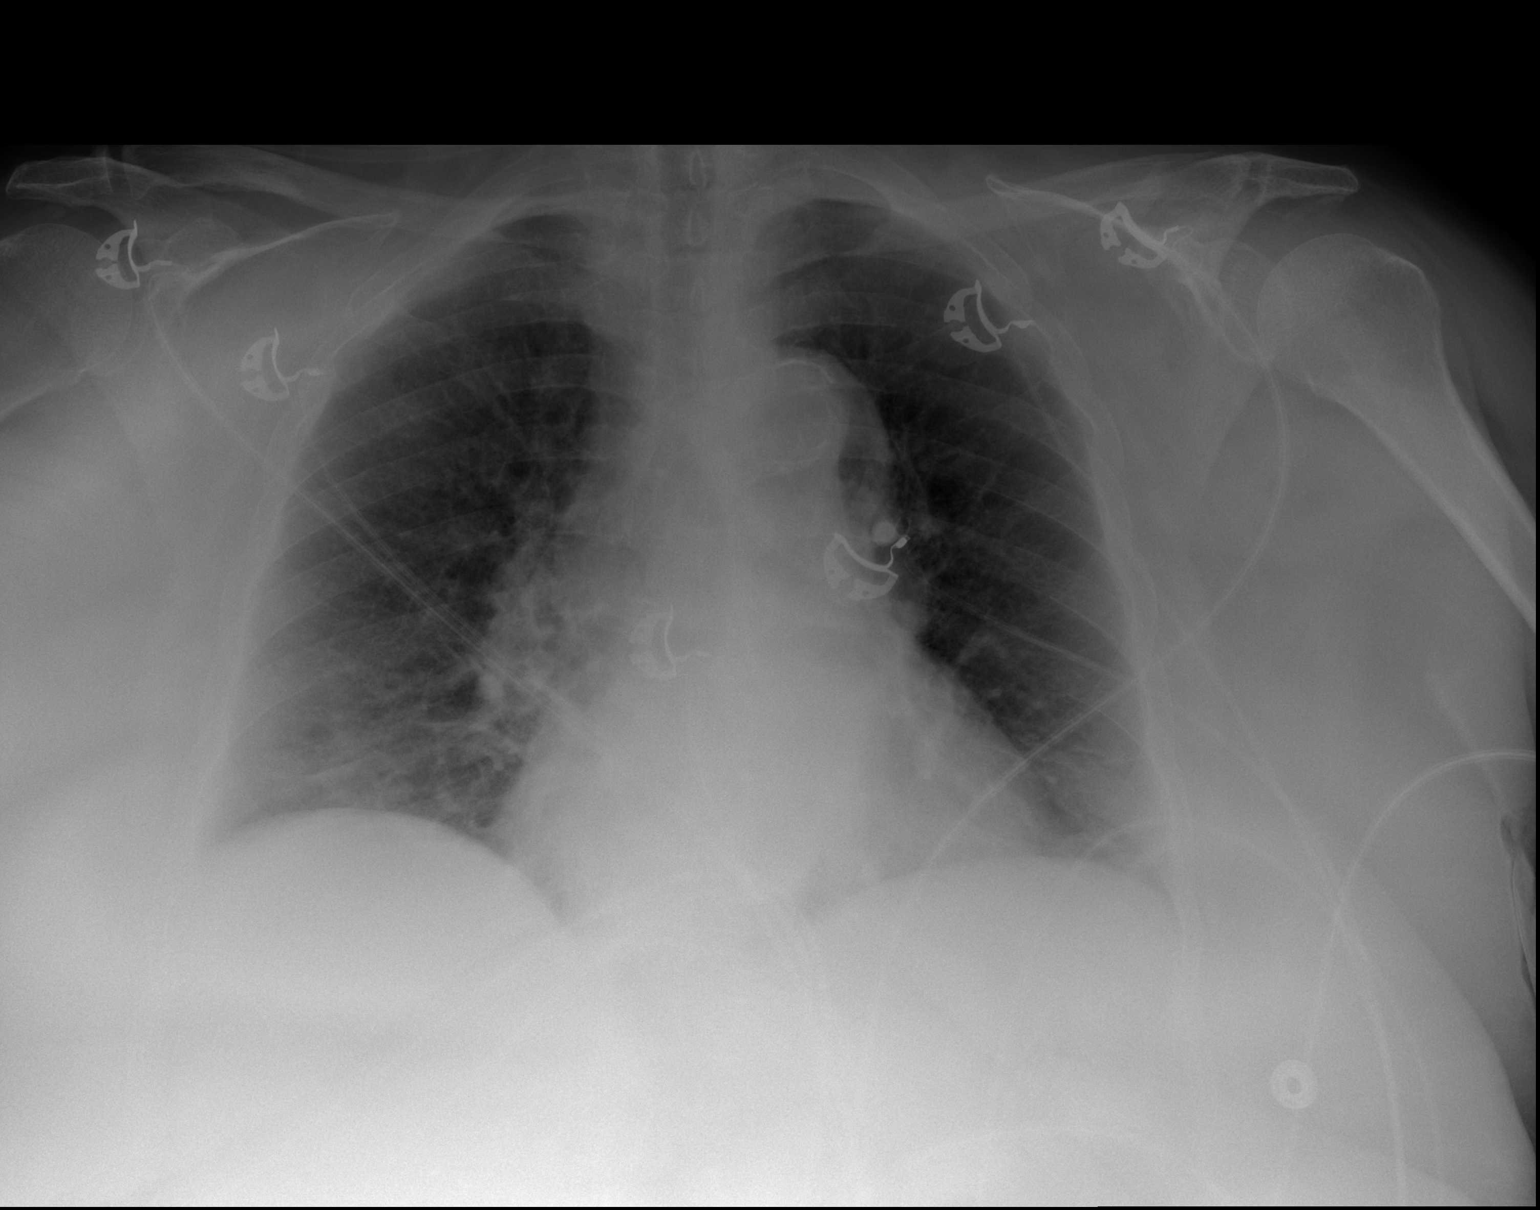

[1 of 1 positions shown; findings below may reference images not displayed]

FINDINGS: Mild cardiomegaly. Low volumes. Bibasilar atelectasis. No
pneumothorax or pleural effusion.
IMPRESSION: Bibasilar atelectasis.  Cardiomegaly.

## 2020-05-08 DIAGNOSIS — E039 Hypothyroidism, unspecified: Secondary | ICD-10-CM | POA: Diagnosis not present

## 2020-05-08 DIAGNOSIS — E559 Vitamin D deficiency, unspecified: Secondary | ICD-10-CM | POA: Diagnosis not present

## 2020-05-08 DIAGNOSIS — R7309 Other abnormal glucose: Secondary | ICD-10-CM | POA: Diagnosis not present

## 2020-05-08 IMAGING — US US EXTREM LOW VENOUS*R*
1 series · 13 of 24 positions shown · non-contrast
Comparison: None.

CLINICAL DATA: Right lower extremity pain and edema. History of
smoking. Evaluate for DVT.



[Series 1: us extrem low venous*right* · 13 of 35 slices shown]
[im 1/35]
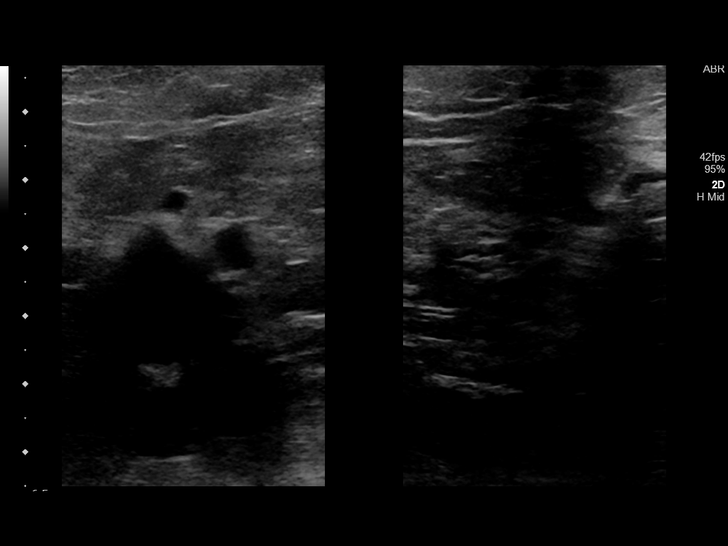
[im 3/35]
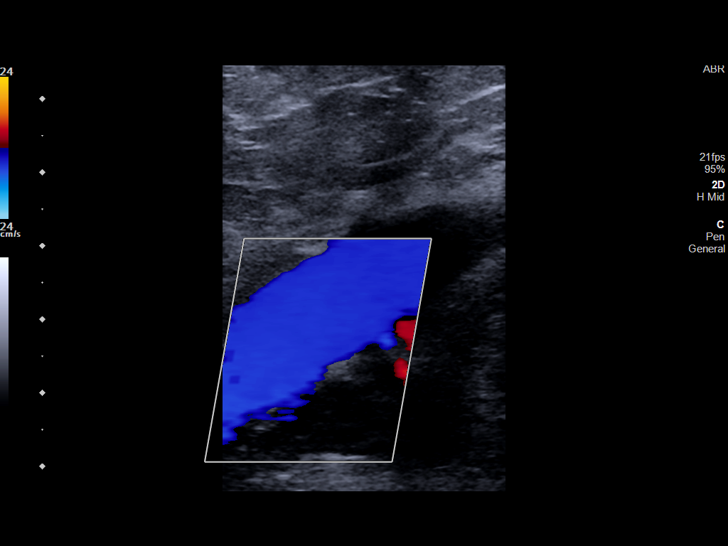
[im 6/35]
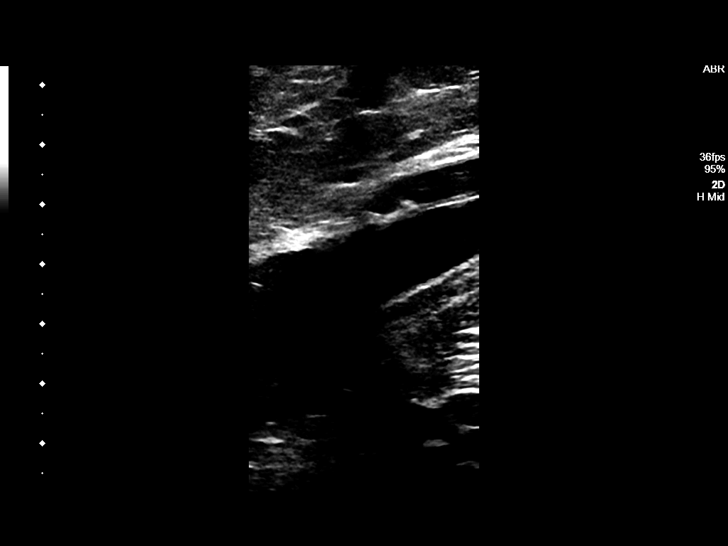
[im 9/35]
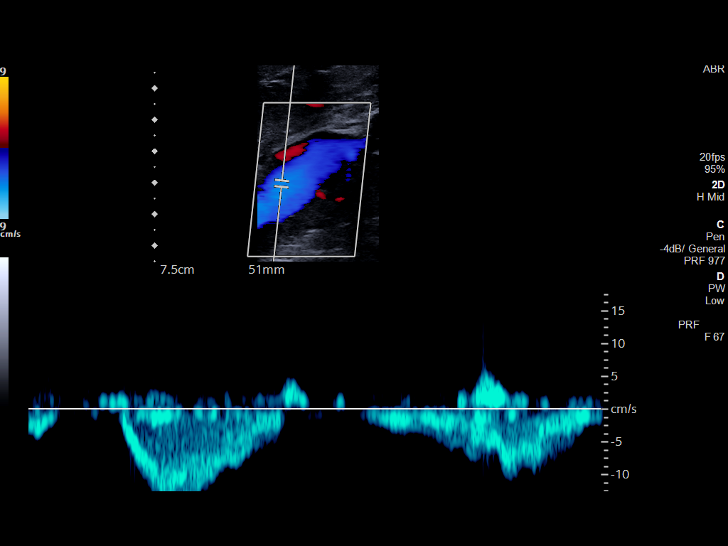
[im 12/35]
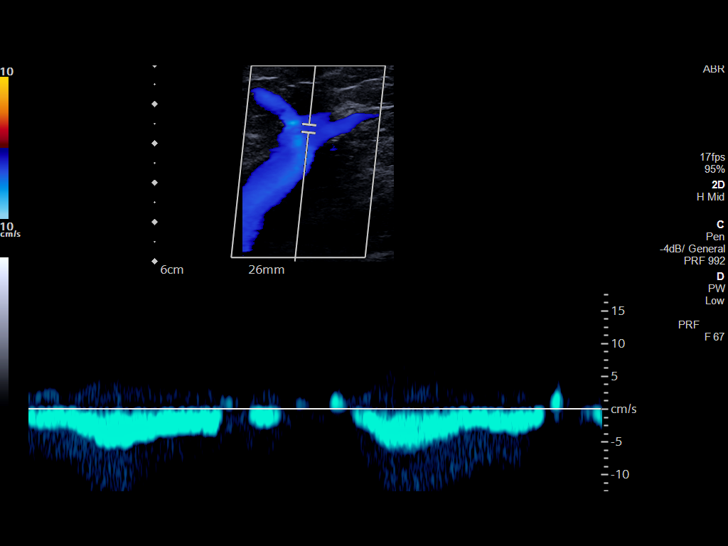
[im 15/35]
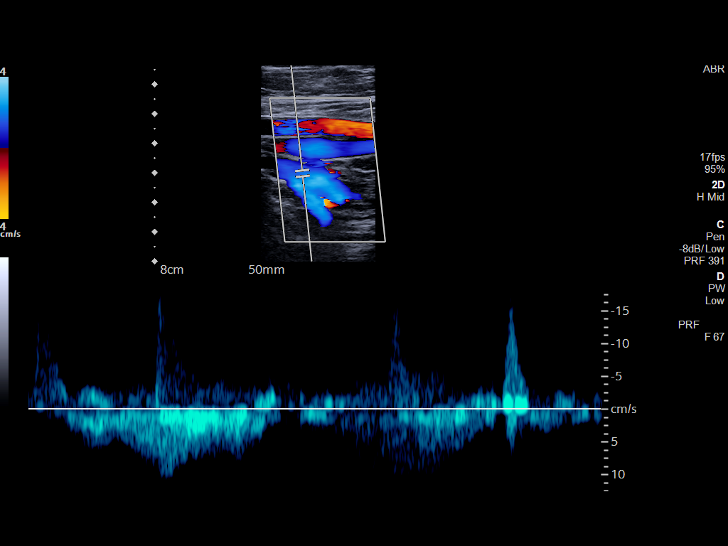
[im 18/35]
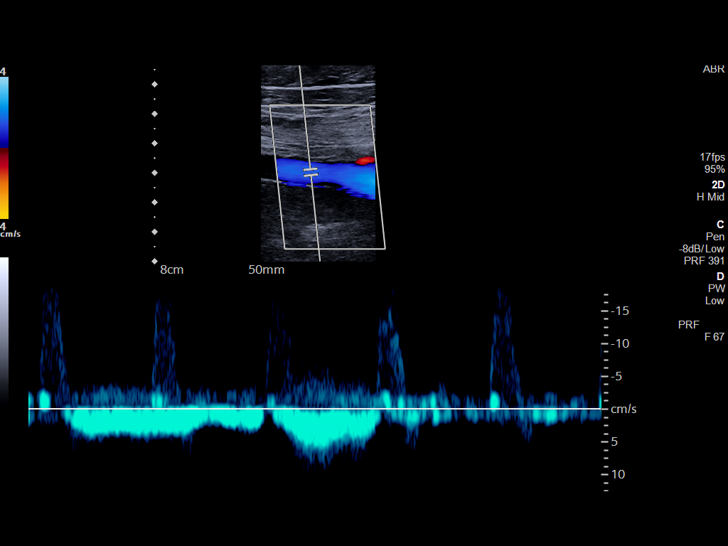
[im 20/35]
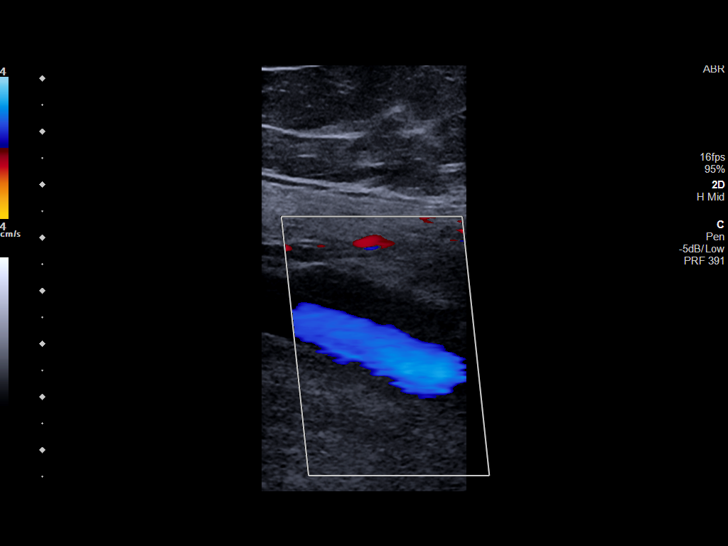
[im 23/35]
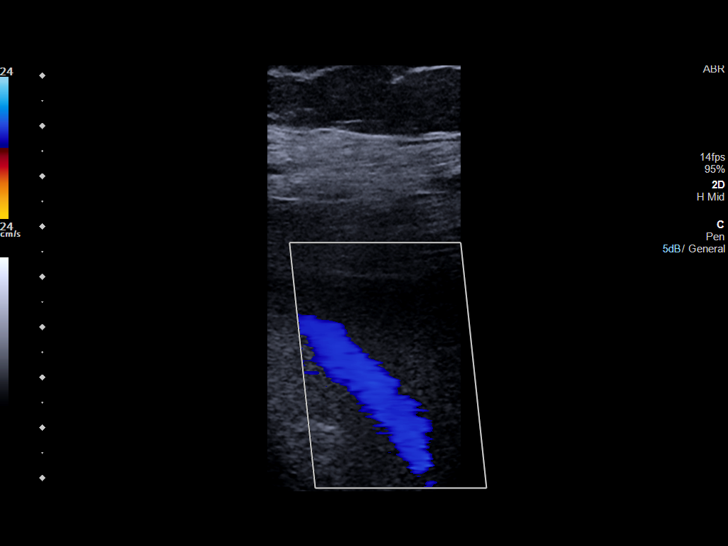
[im 26/35]
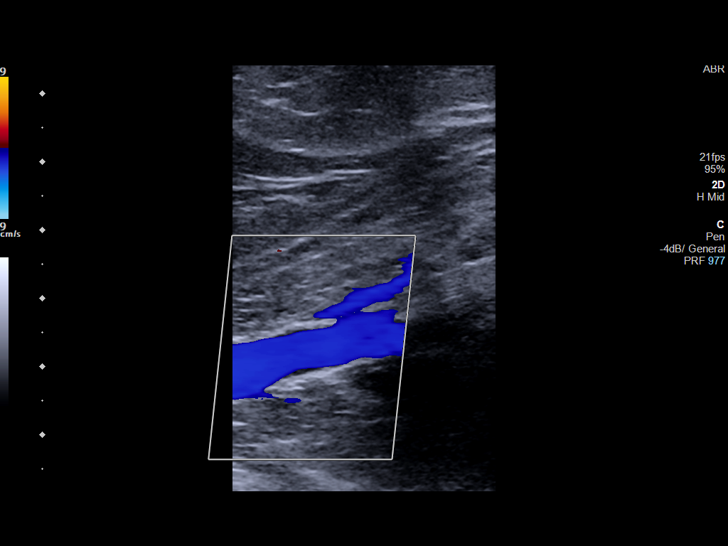
[im 29/35]
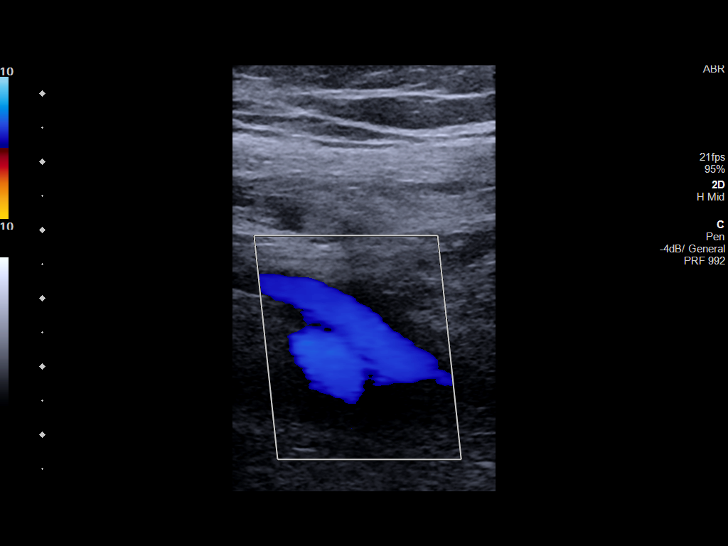
[im 32/35]
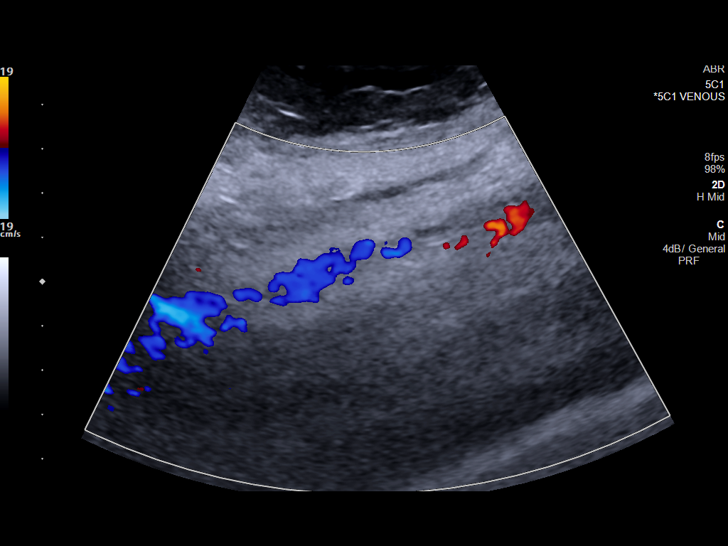
[im 35/35]
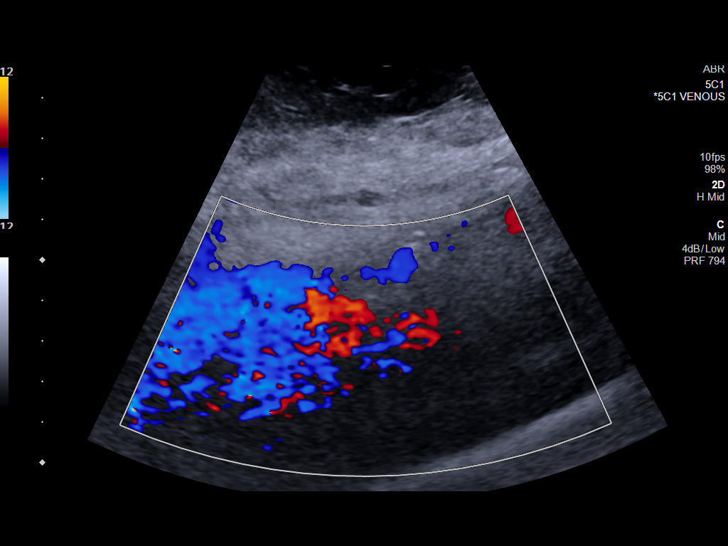

[13 of 24 positions shown; findings below may reference images not displayed]

FINDINGS: Examination is degraded due to patient body habitus and poor
sonographic window.

Contralateral Common Femoral Vein: Respiratory phasicity is normal
and symmetric with the symptomatic side. No evidence of thrombus.
Normal compressibility.

Common Femoral Vein: No evidence of thrombus. Normal
compressibility, respiratory phasicity and response to augmentation.

Saphenofemoral Junction: No evidence of thrombus. Normal
compressibility and flow on color Doppler imaging.

Profunda Femoral Vein: No evidence of thrombus. Normal
compressibility and flow on color Doppler imaging.

Femoral Vein: No evidence of thrombus. Normal compressibility,
respiratory phasicity and response to augmentation.

Popliteal Vein: No evidence of thrombus. Normal compressibility,
respiratory phasicity and response to augmentation.

Calf Veins: No evidence of thrombus. Normal compressibility and flow
on color Doppler imaging.

Superficial Great Saphenous Vein: No evidence of thrombus. Normal
compressibility.

Venous Reflux:  None.

Other Findings:  None.
IMPRESSION: No evidence of DVT within the right lower extremity.

## 2020-05-09 IMAGING — DX DG CHEST 1V
1 series · 1 of 1 positions shown · non-contrast
Comparison: 10/26/2017

CLINICAL DATA: Possible seizure, altered mental status yesterday,
back to baseline

EXAM:
CHEST  1 VIEW

[chest ap]
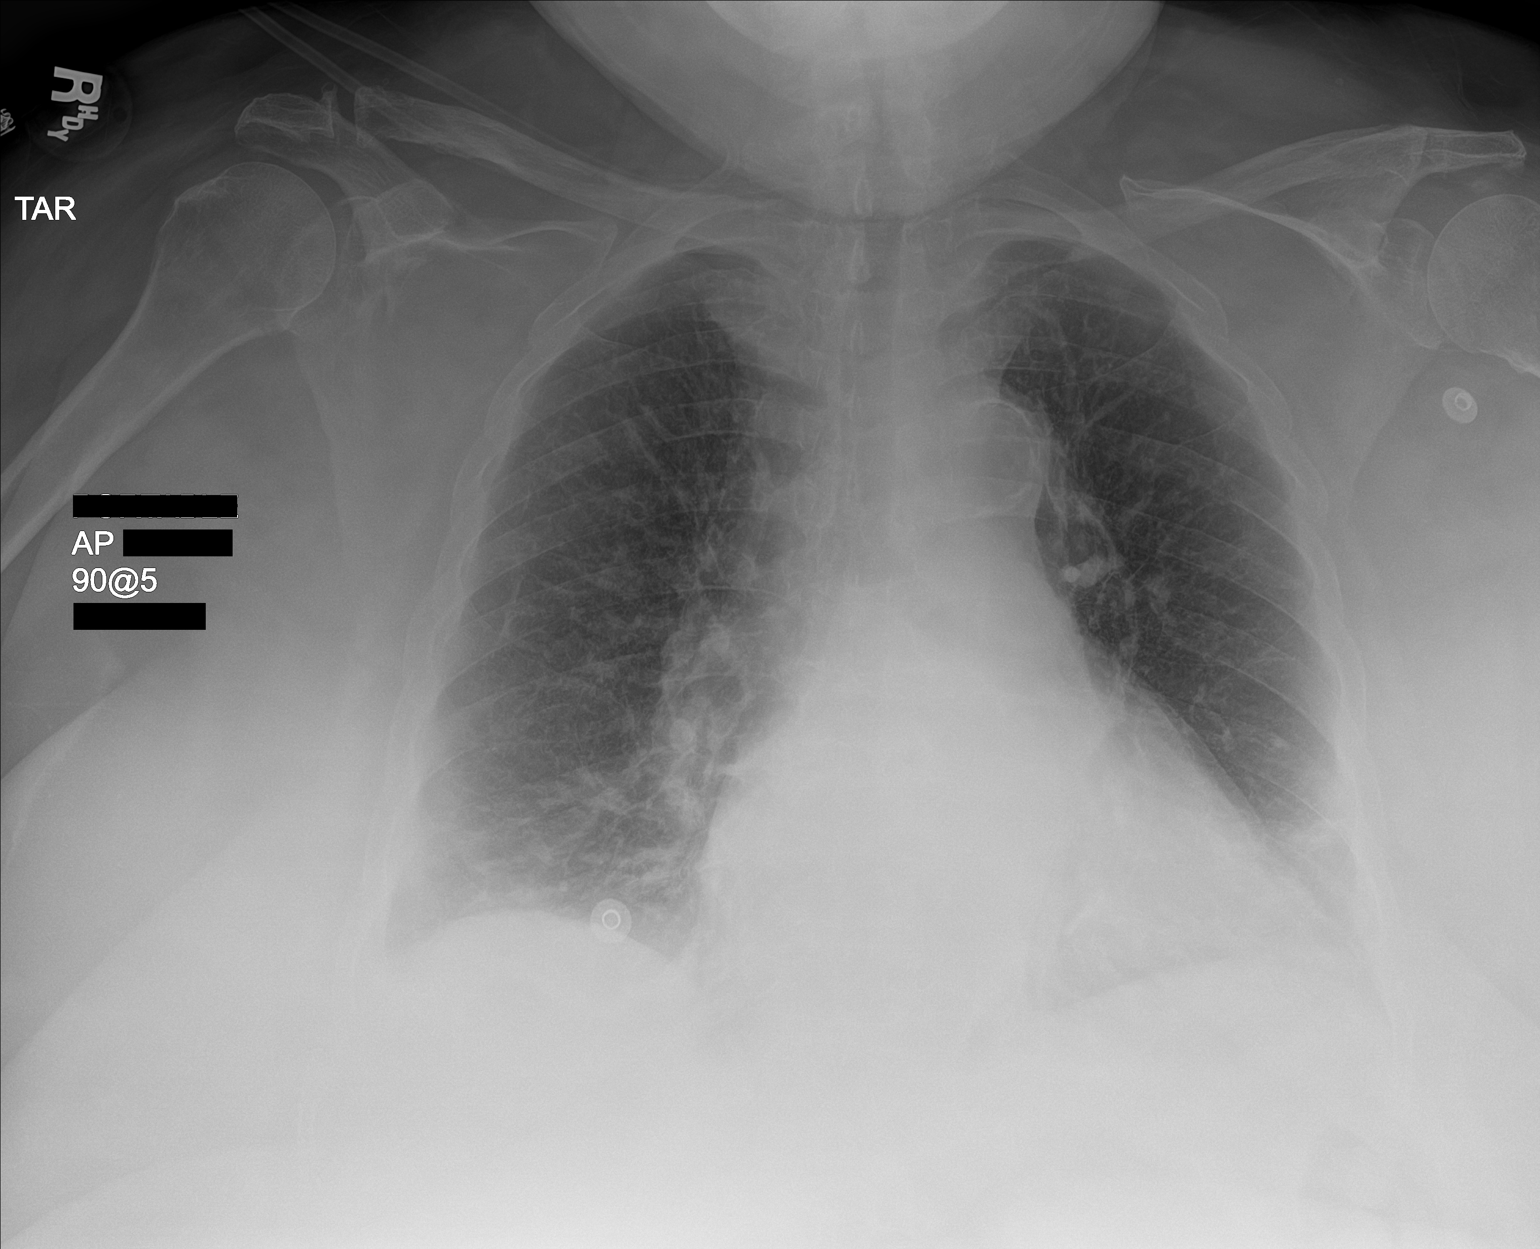

[1 of 1 positions shown; findings below may reference images not displayed]

FINDINGS: Mild lingular and bibasilar opacities, likely atelectasis. No focal
consolidation. Survey fusion

The heart is mildly enlarged.
IMPRESSION: Mild lingular and bibasilar opacities, likely atelectasis.

## 2020-05-10 DIAGNOSIS — E1122 Type 2 diabetes mellitus with diabetic chronic kidney disease: Secondary | ICD-10-CM | POA: Diagnosis not present

## 2020-05-10 DIAGNOSIS — N184 Chronic kidney disease, stage 4 (severe): Secondary | ICD-10-CM | POA: Diagnosis not present

## 2020-05-10 DIAGNOSIS — E785 Hyperlipidemia, unspecified: Secondary | ICD-10-CM | POA: Diagnosis not present

## 2020-05-17 ENCOUNTER — Other Ambulatory Visit: Payer: Self-pay

## 2020-05-17 ENCOUNTER — Encounter: Payer: Self-pay | Admitting: Nurse Practitioner

## 2020-05-17 ENCOUNTER — Non-Acute Institutional Stay: Payer: Medicare HMO | Admitting: Nurse Practitioner

## 2020-05-17 VITALS — BP 132/80 | HR 72 | Temp 97.2°F | Resp 18 | Wt 211.1 lb

## 2020-05-17 DIAGNOSIS — Z515 Encounter for palliative care: Secondary | ICD-10-CM

## 2020-05-17 DIAGNOSIS — I509 Heart failure, unspecified: Secondary | ICD-10-CM

## 2020-05-17 NOTE — Progress Notes (Signed)
Katherine [name] Consult Note Telephone: [phone]  Fax: [phone]  PATIENT NAME: Katherine [name] DOB: 04-22-1940 MRN: [id number] PRIMARY CARE PROVIDER: [facility] RESPONSIBLE PARTY:Self/Katherine [name] spouse [phone]  RECOMMENDATIONS and PLAN: 1.ACP: remains a full code; most form in ACP  2.Edema secondary toCHF, monitor weights, elevate  3.Shortness of breathsecondary toCHF, controlled;  continue diuresing, inhalation therapy; Continuous O2,Continue daily weights  4.Palliative care encounter; Palliative medicine team will continue to support patient, patient's family, and medical team. Visit consisted of counseling and education dealing with the complex and emotionally intense issues of symptom management and palliative care in the setting of serious and potentially life-threatening illness  5. F/u4weeks or sooner if declines; high risk for re-hospitalization I spent 35 minutes providing this consultation, starting at 1:00pm. More than 50% of the time in this consultation was spent coordinating communication.   HISTORY OF PRESENT ILLNESS:  Katherine [name] is a 80 y.o. 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 Katherine [name] resides at [place] [facility]. Katherine [name] remains bed bound, require staff to assist her  with turning, positioning, bathing, dressing. Katherine [name] does feed herself after tray setup. Katherine. [name] has been requiring more assistance with feeding per staff. Staff endorses no recent hospitalization, wounds, falls, infections. Staff endorses no new changes or concerns. Staff endorses Katherine. [name] has good days and more difficult days. Cognitively she appears to be slowly declining. I visited and observed Katherine [name] who was lying in her bed. Katherine [name] was oriented to self. Katherine [name] did make eye contact, had difficulty answering questions with appropriate answers. Katherine [name] was in no distress, smiling, attempting to be interactive. Katherine [name] appears debilitated, chronically ill, comfortable. Katherine [name] was cooperative with assessment. Emotional support provided. I attempted to contact Katherine [name] husband, Katherine [name], unable to leave a message for ongoing discussions of complex medical decision making. Medical goals reviewed. I updated staff continue current goc until further discussions with family including code status as Katherine [name] remains a full code.   05/08/2020 wbc 5.0, hemoglobin 50m, hemocrit 31.9, platelets 180, sodium 146, potassium 4.5, chloride 107, co2 28, calcium 9.3, bun 20.3, creatinine 1.43, glucose 90, total protein 5.8, albumin 3.0, ALT 16, AST 24, tsh 1.92  Palliative Care was asked to help to continue to address goals of care.   I reviewed the patient chart, labs, medications and all admission documents from the facility here. I reviewed the patient, all hospital/facility records including H&P, discharge summary, medications, Lab tests ect. I discussed case with staff and discussed diagnosis and treatment plans with staff.   *This note was dictated using voice recognition software.  Despite best efforts to proofread, errors can occur which can change the meaning.  Any change was purely unintentional.  CODE STATUS: full code  PPS: 30% HOSPICE ELIGIBILITY/DIAGNOSIS: TBD  PAST MEDICAL  HISTORY:  Past Medical History:  Diagnosis Date  . Angioedema   . Anxiety and depression   [place]   . Chronic constipation   . Chronic kidney disease  stage 3  . COPD (chronic obstructive pulmonary disease) ([place])   . Diabetes mellitus without complication ([place])   . Gallstones   . GERD (gastroesophageal reflux disease)   . Hypertension   . Left ventricular hypertrophy   . Osteoarthritis   . Prurigo nodularis   . Seizures ([place])   . Stroke ([place])   . Tobacco abuse   . Vitamin D deficiency disease     SOCIAL HX:  Social History   Tobacco Use  . Smoking status: Former Smoker    Packs/day: 0.50    Years: 60.00    Pack years: 30.00    Types: Cigarettes  . Smokeless tobacco: Never Used  . Tobacco comment: quite 46moago-03/26/18  Substance Use Topics  . Alcohol use: No    Alcohol/week: 0.0 standard drinks    Comment: rare    ALLERGIES:  Allergies  Allergen Reactions  . Bee Venom Swelling  . Enalapril Maleate Swelling       PHYSICAL EXAM:   General: chronically ill, debilitated, oriented to self female Cardiovascular: regular rate and rhythm Pulmonary: few wheezes, decrease bases Neurological: functional quadriplegic  Katherine [name] Katherine Gully NP

## 2020-05-22 IMAGING — CT CT HEAD W/O CM
3 series · 15 of 44 positions shown, 18 images · non-contrast
Comparison: October 26, 2017

CLINICAL DATA: Dysarthria

EXAM:
CT HEAD WITHOUT CONTRAST
TECHNIQUE: Contiguous axial images were obtained from the base of the skull
through the vertex without intravenous contrast.

[Series 2: head wo · axial · 0.47mm/px · z∈[+421,+531]mm · 9 of 27 slices shown, 12 images]
[im 3/27  brain]
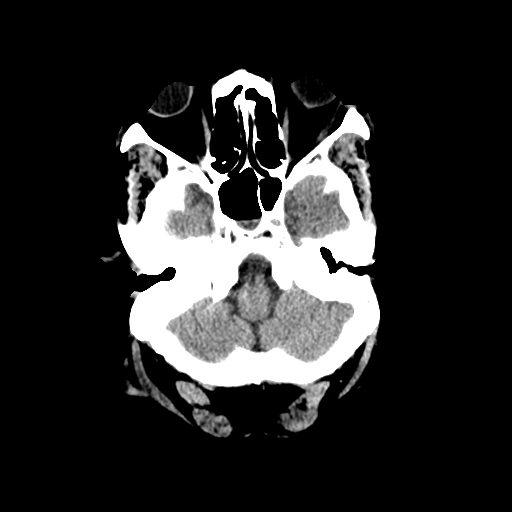
[im 3/27  bone]
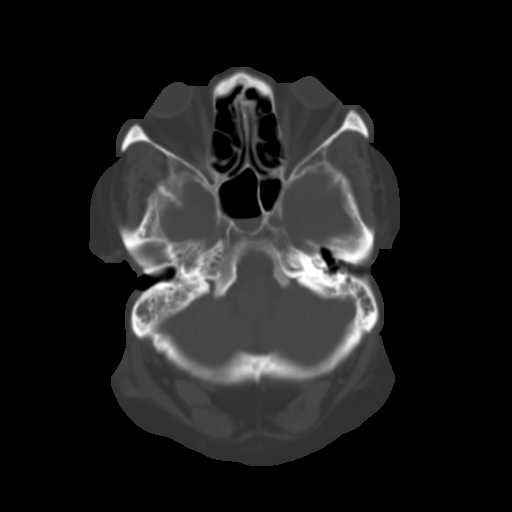
[im 6/27  brain]
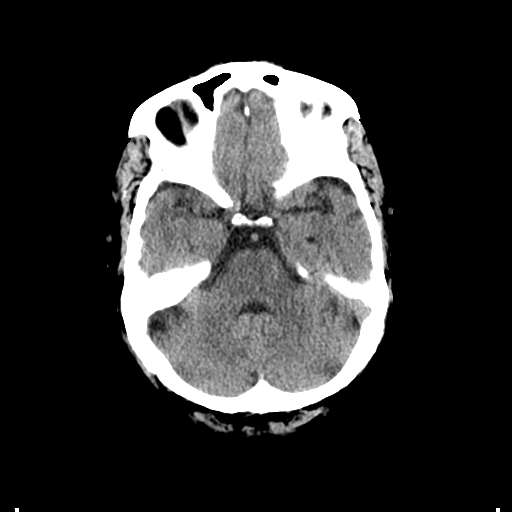
[im 8/27  brain]
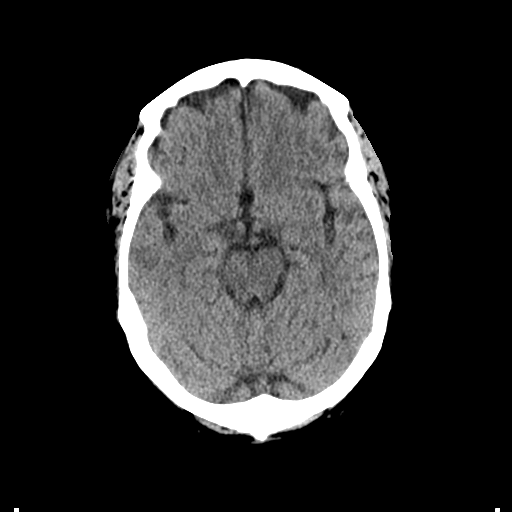
[im 11/27  brain]
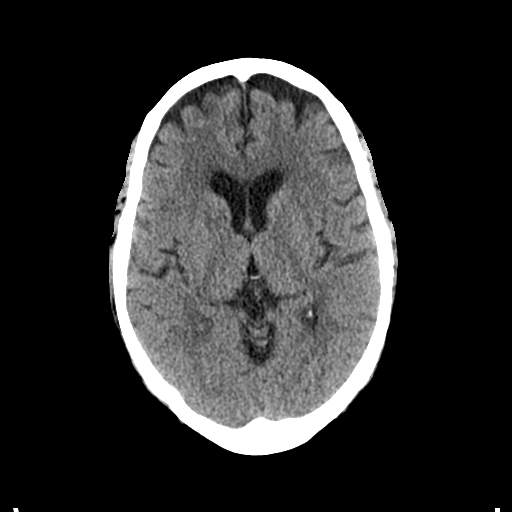
[im 14/27  brain]
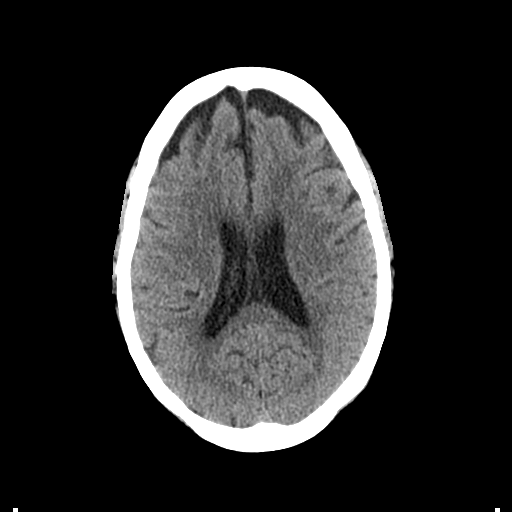
[im 14/27  bone]
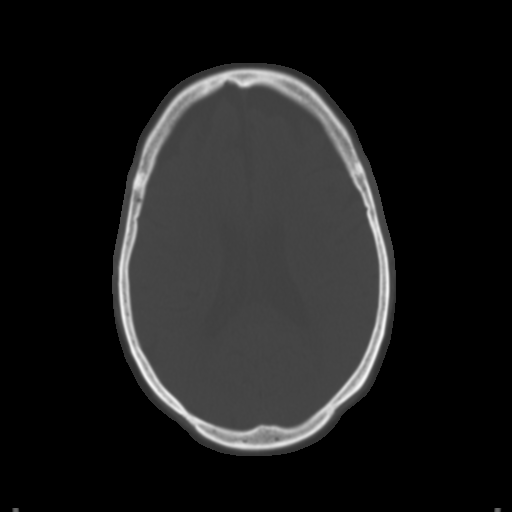
[im 17/27  brain]
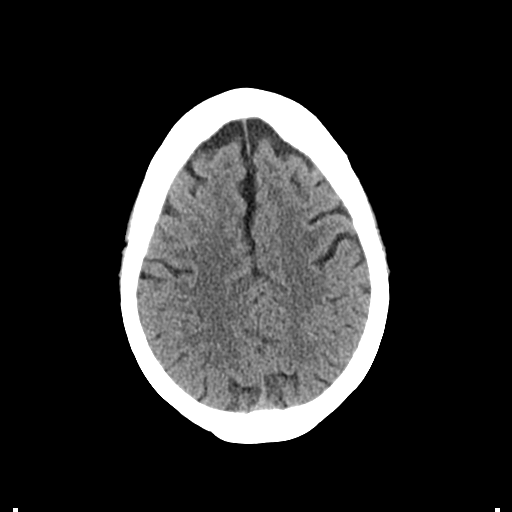
[im 20/27  brain]
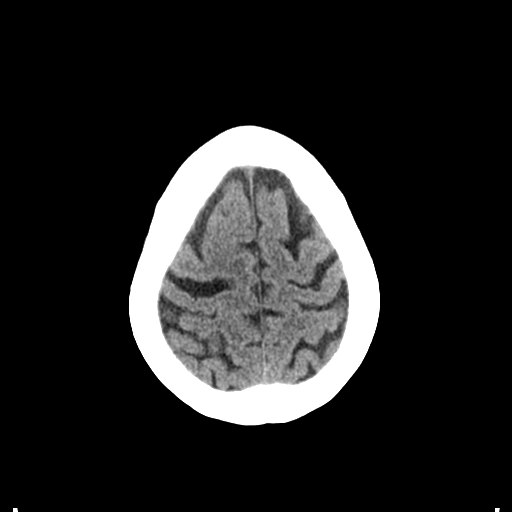
[im 22/27  brain]
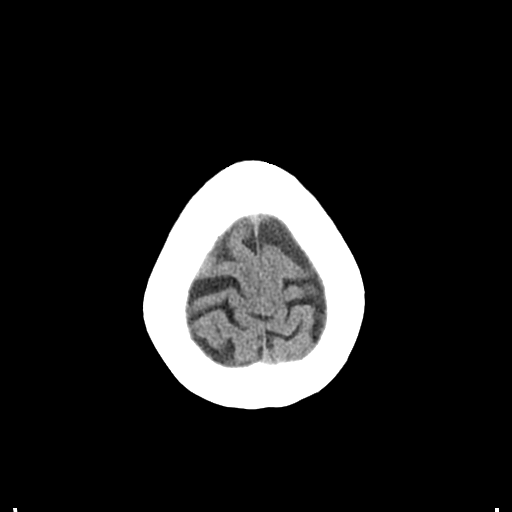
[im 25/27  brain]
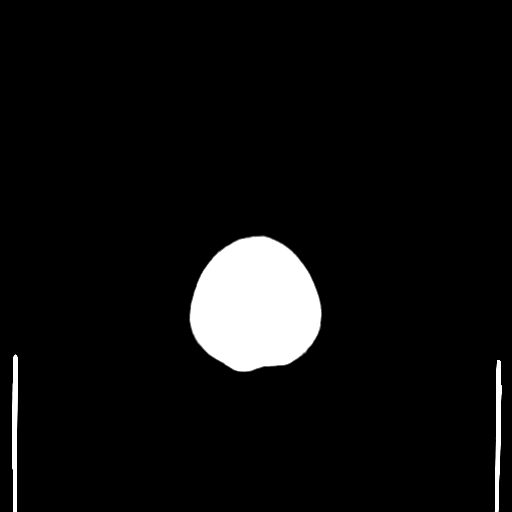
[im 25/27  bone]
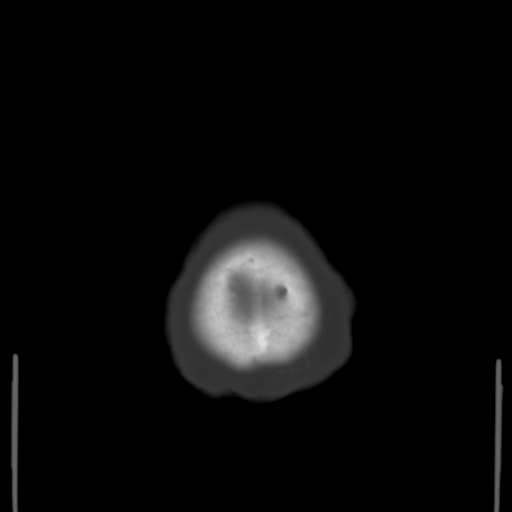

[Series 4: coronal soft tissue · coronal · 0.27mm/px · 3 of 67 slices shown]
[im 23/67  brain]
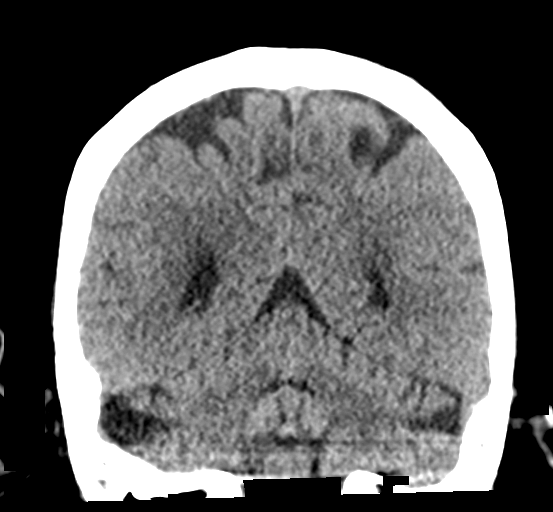
[im 30/67  brain]
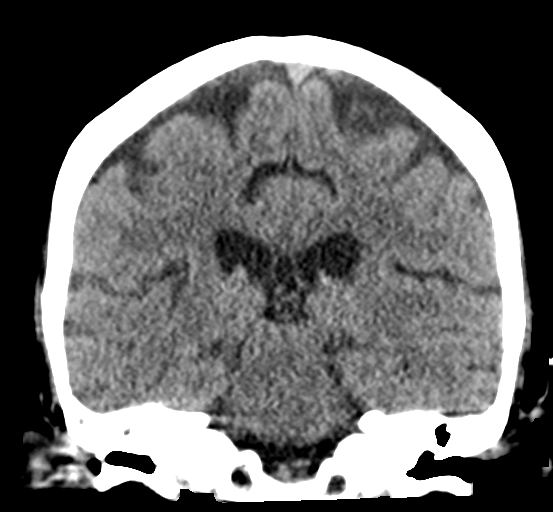
[im 37/67  brain]
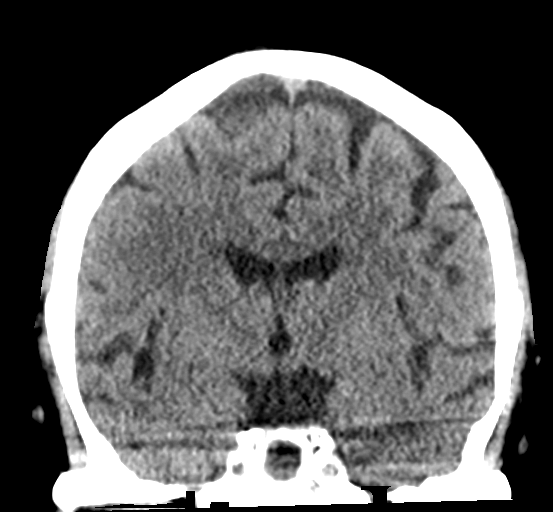

[Series 5: sagittal soft tissue · sagittal · 0.27mm/px · 3 of 50 slices shown]
[im 17/50  brain]
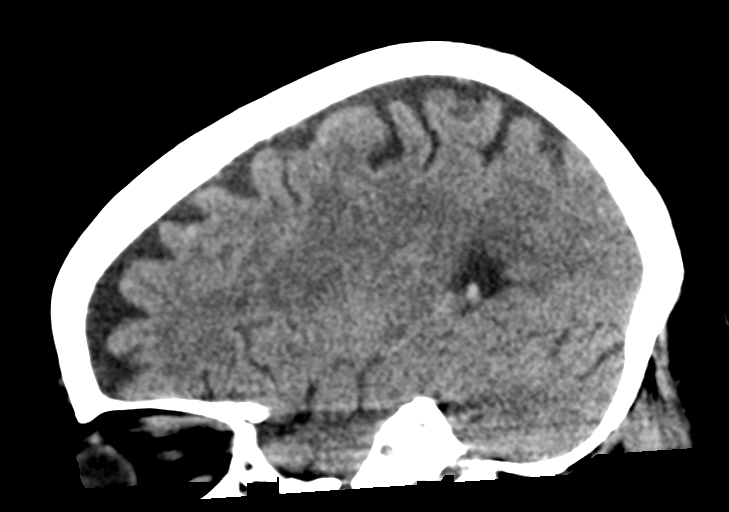
[im 25/50  brain]
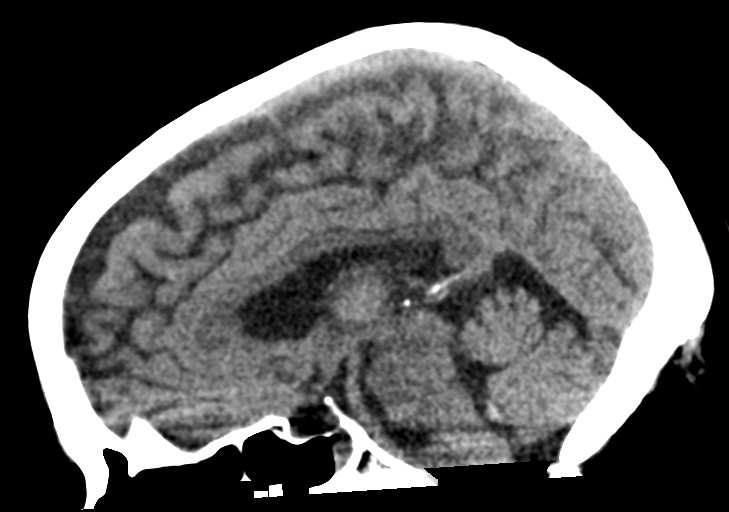
[im 33/50  brain]
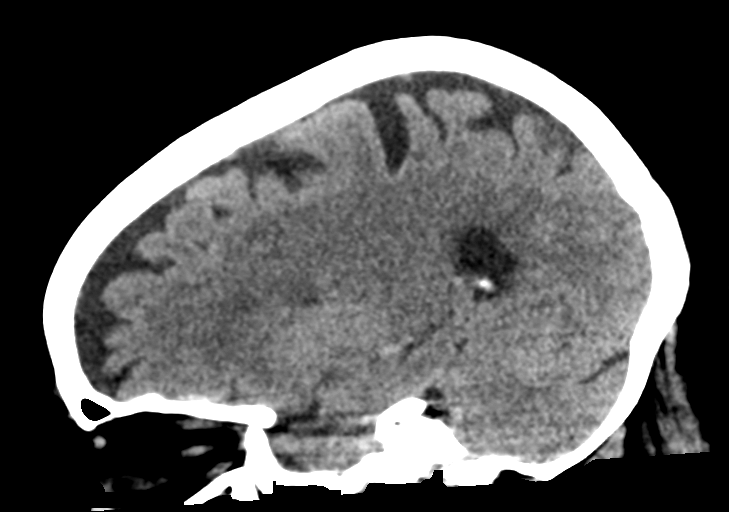

[15 of 44 positions shown; findings below may reference images not displayed]

FINDINGS: Brain: Mild diffuse atrophy is stable. There is no intracranial
mass, hemorrhage, extra-axial fluid collection, or midline shift.
There is mild patchy small vessel disease in the centra semiovale
bilaterally, slightly more on the right than on the left, stable. No
new brain parenchymal lesions evident. No acute infarct evident.

Vascular: No hyperdense vessel. There is calcification in each
carotid siphon region.

Skull: The bony calvarium appears intact.

Sinuses/Orbits: There is an air-fluid level in the right sphenoid
sinus, also present on CT from 2 weeks prior. There is mucosal
thickening in several ethmoid air cells which appears stable.
Visualized orbits appear symmetric bilaterally.

Other: Extensive mastoid air cell disease persists without change.
IMPRESSION: Atrophy with mild patchy periventricular small vessel disease,
stable. No acute infarct evident. No mass or hemorrhage.

There are foci of arterial vascular calcification. Extensive mastoid
air cell disease persists. Persistent paranasal sinus disease noted
with air-fluid level in the right sphenoid sinus, stable.

## 2020-05-22 IMAGING — CR DG CHEST 2V
2 series · 3 of 3 positions shown · non-contrast
Comparison: 11/10/2017, 10/28/2017, 10/26/2017

CLINICAL DATA: Shortness of breath

EXAM:
CHEST - 2 VIEW

[Series 2: chest lat · 0.14mm/px · 2 of 2 slices shown]
[im 1/2]
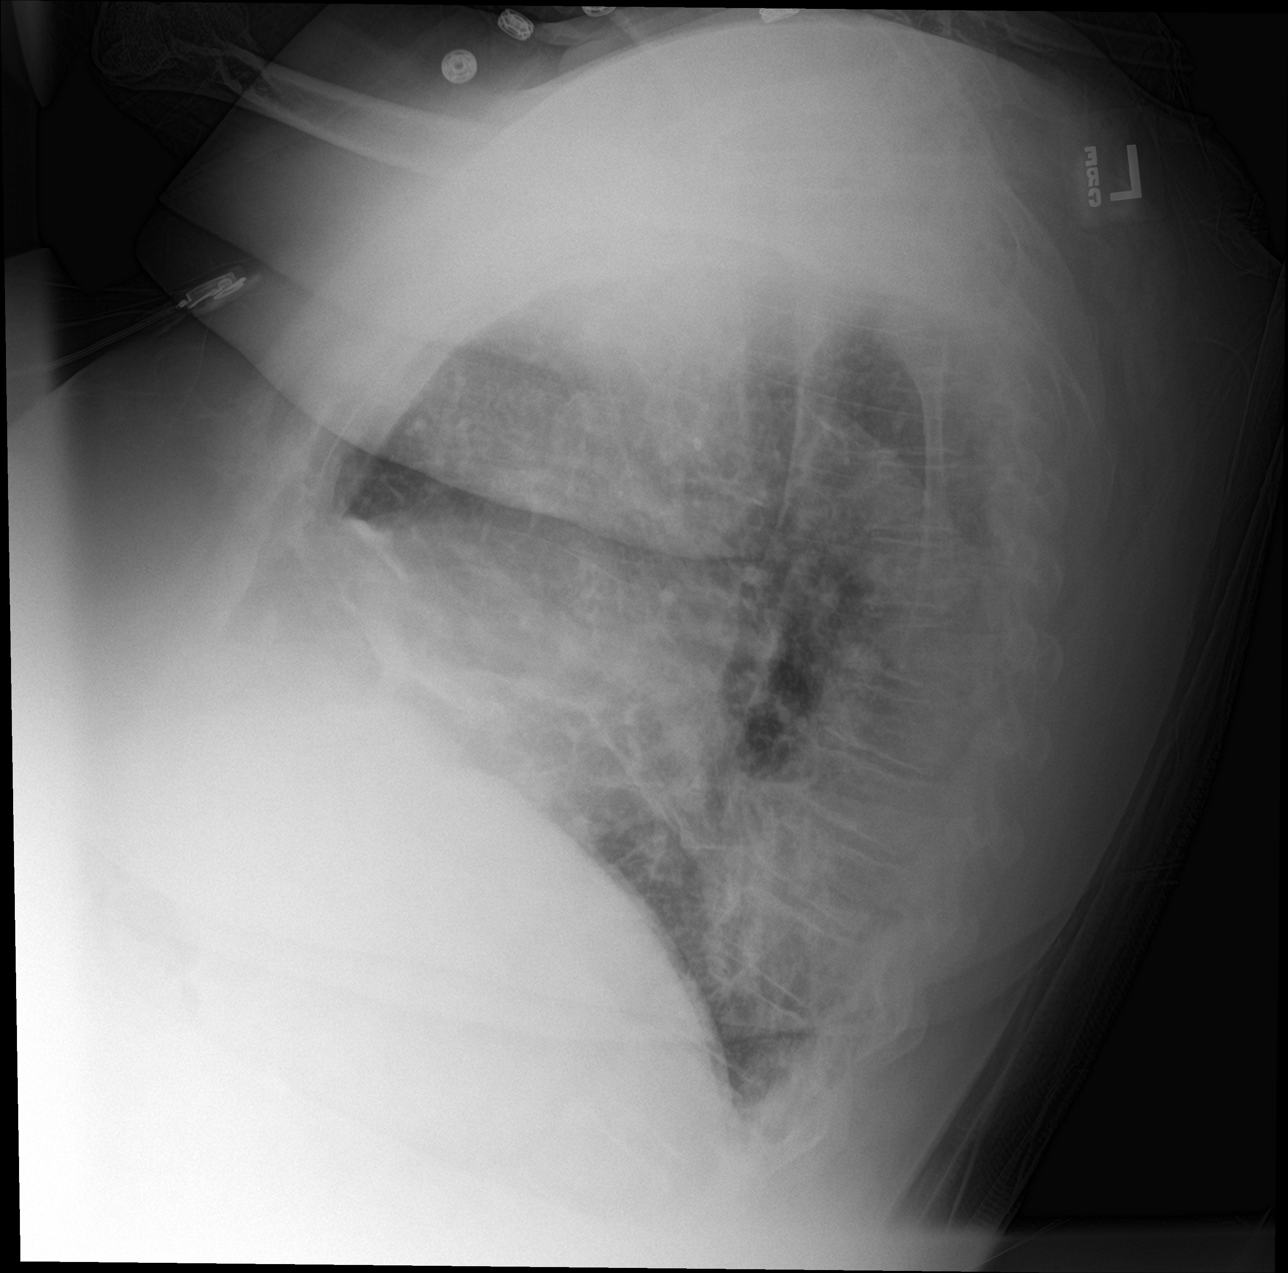
[im 2/2]
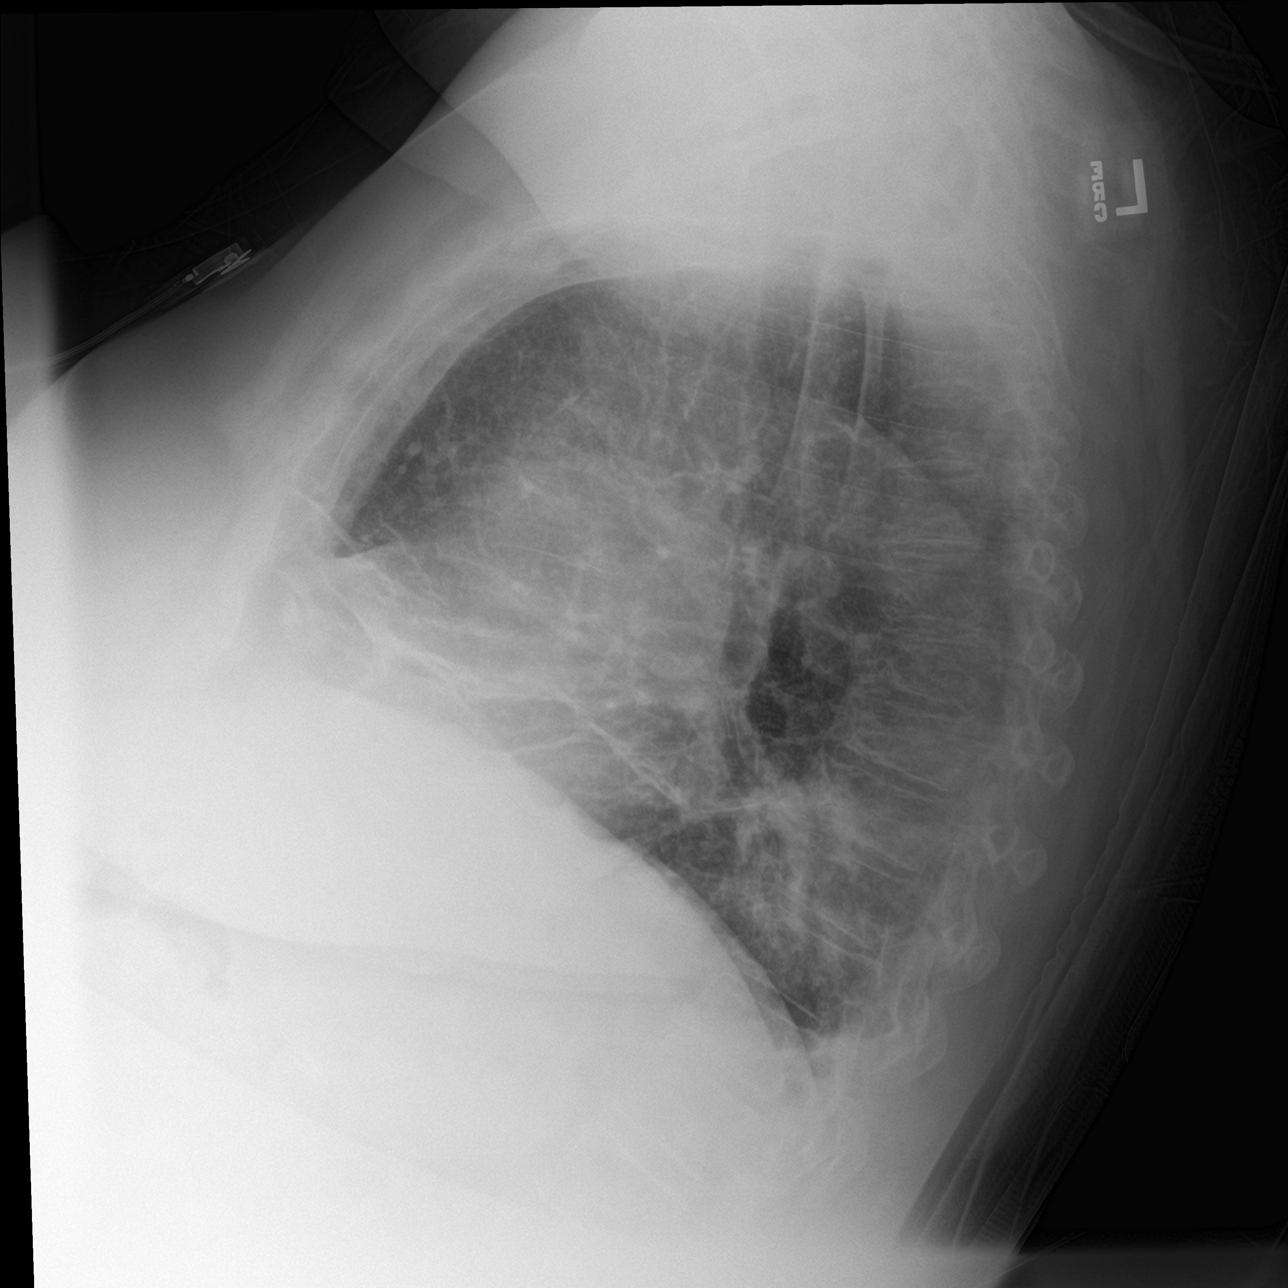

[chest ap]
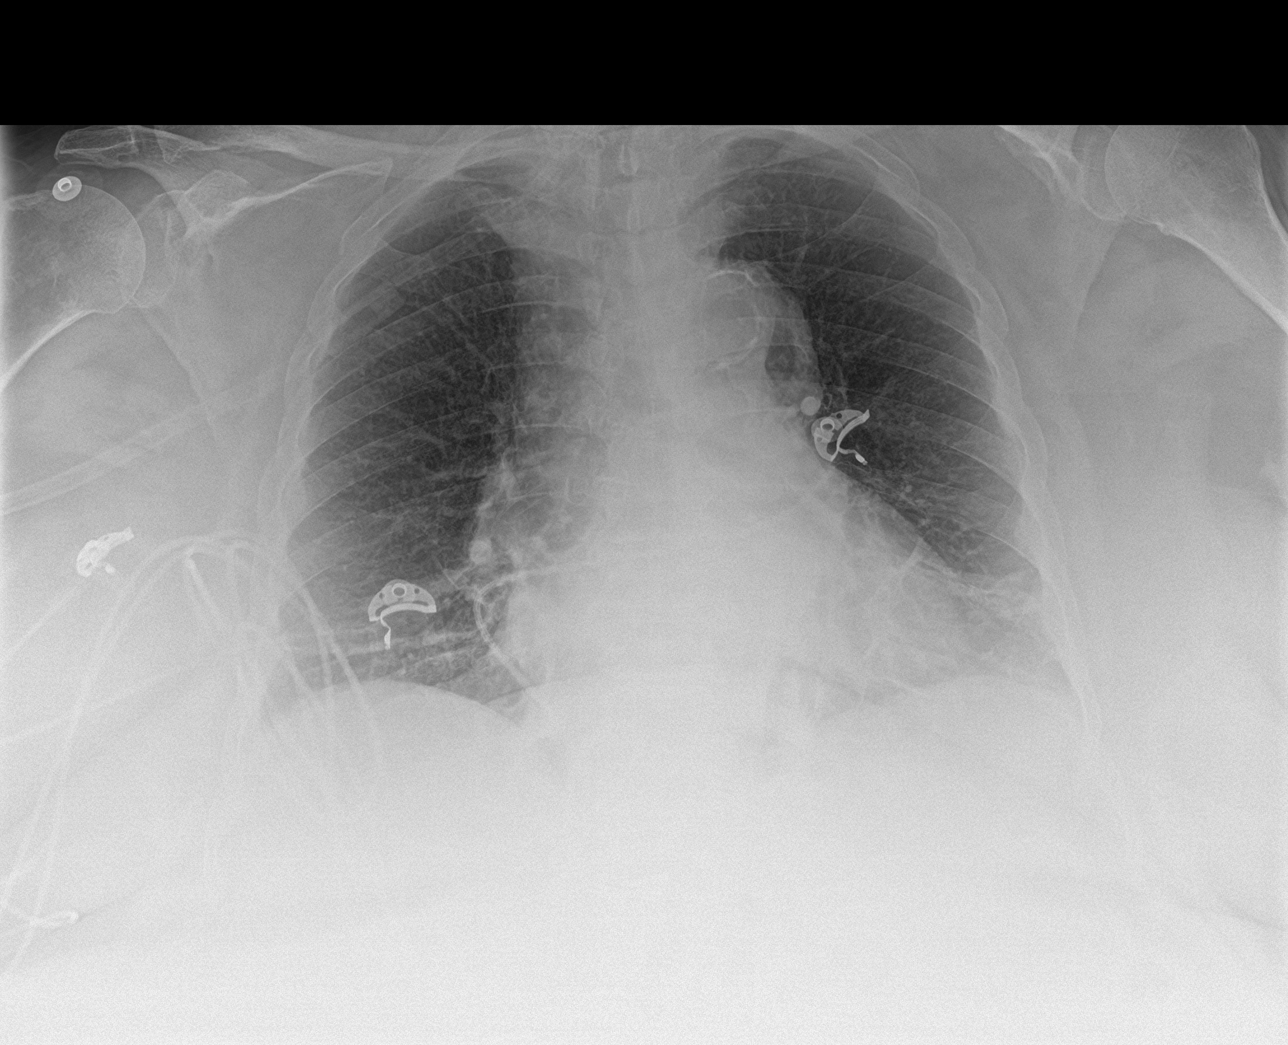

[3 of 3 positions shown; findings below may reference images not displayed]

FINDINGS: No pleural effusion. Scarring or atelectasis at the left base. Mild
cardiomegaly with tortuous calcified aorta. No pneumothorax.
IMPRESSION: 1. Scarring or atelectasis at the left base
2. Stable mild cardiomegaly

## 2020-06-01 IMAGING — DX DG CHEST 1V PORT
1 series · 1 of 1 positions shown · non-contrast
Comparison: Radiographs November 10, 2017.

CLINICAL DATA: Back pain.

EXAM:
PORTABLE CHEST 1 VIEW

[chest ap]
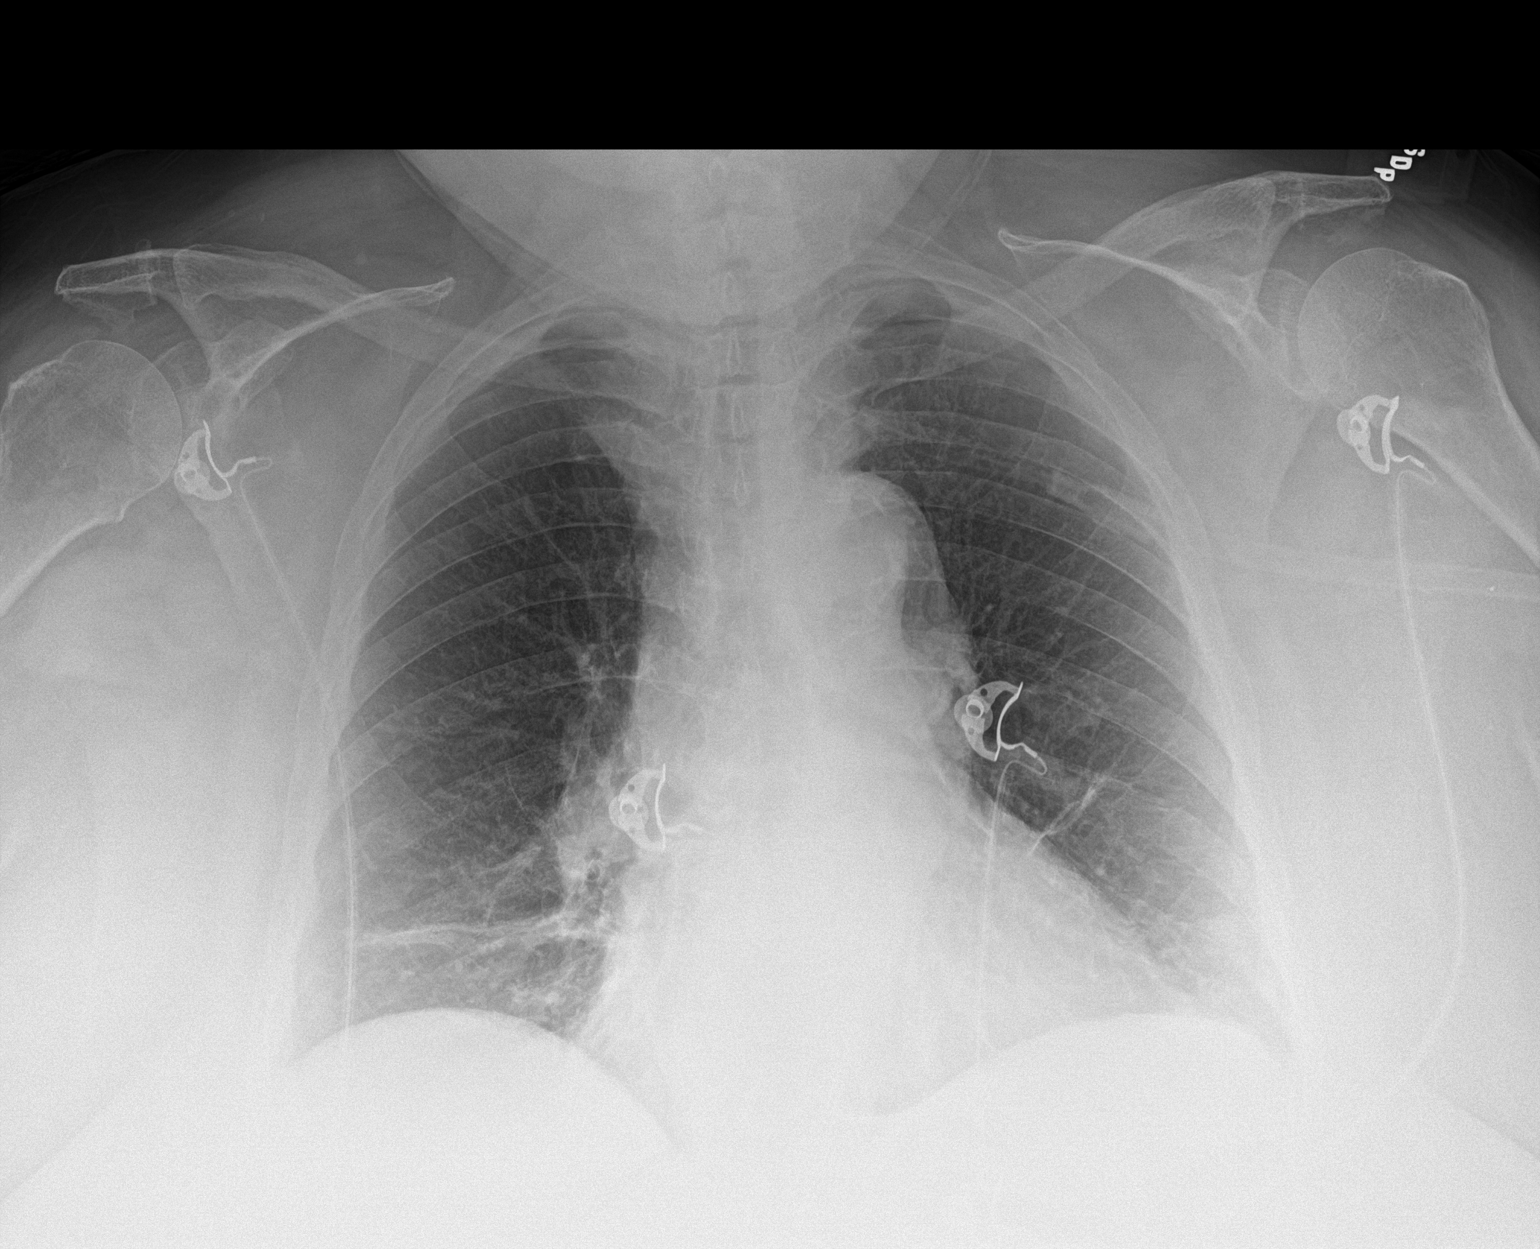

[1 of 1 positions shown; findings below may reference images not displayed]

FINDINGS: Stable cardiomediastinal silhouette. No pneumothorax or pleural
effusion is noted. Atherosclerosis of thoracic aorta is noted. Left
lung is clear. Mild right basilar subsegmental atelectasis or
scarring is noted. Bony thorax is unremarkable.
IMPRESSION: Mild right basilar subsegmental atelectasis or scarring.

Aortic Atherosclerosis ([id number].O).

## 2020-06-01 IMAGING — NM NM PULMONARY VENT & PERF
2 series · 16 of 16 positions shown · non-contrast
Comparison: Chest radiograph 11/20/2017

CLINICAL DATA: Pleural effusion. Fall. Upper back pain radiating to
the left side

EXAM:
NUCLEAR MEDICINE VENTILATION - PERFUSION LUNG SCAN
TECHNIQUE: Ventilation images were obtained in multiple projections using
inhaled aerosol Zc-LLm DTPA. Perfusion images were obtained in
multiple projections after intravenous injection of [id number].
RADIOPHARMACEUTICALS:  Thirty-two mCi of Zc-LLm DTPA aerosol
inhalation and 4.3 mCi [id number] IV

[Series 1000: lung perfusion · 1.95mm/px · 4 acquisitions, 8 frames shown]
[im 1/4]
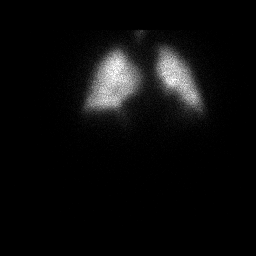
[im 1/4]
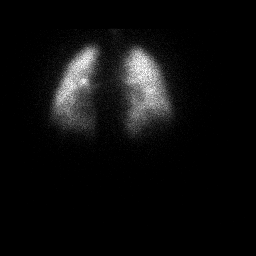
[im 2/4]
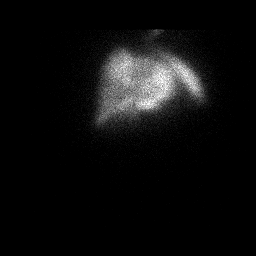
[im 2/4]
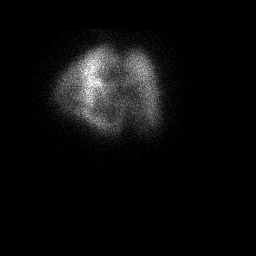
[im 3/4  full-range]
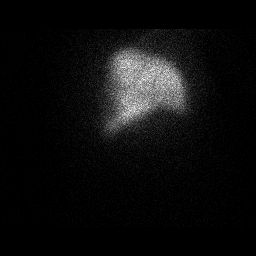
[im 3/4  full-range]
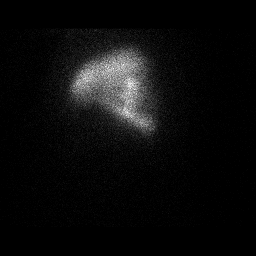
[im 4/4]
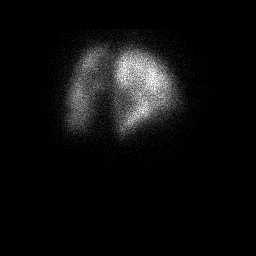
[im 4/4]
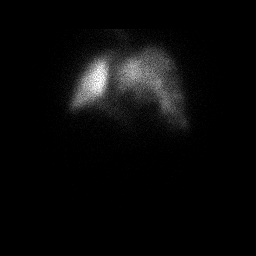

[Series 1000: lung ventilation · 3.90mm/px · 4 acquisitions, 8 frames shown]
[im 1/4]
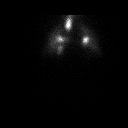
[im 1/4]
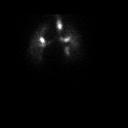
[im 2/4]
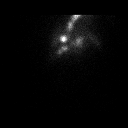
[im 2/4]
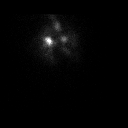
[im 3/4]
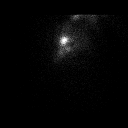
[im 3/4]
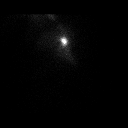
[im 4/4]
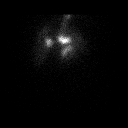
[im 4/4]
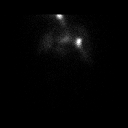

[16 of 16 positions shown; findings below may reference images not displayed]

FINDINGS: Ventilation: Considerable aerosol deposition along the
tracheobronchial tree. Reduced activity posteriorly in both lower
lobes.

Perfusion: Reduced activity posteriorly in both lower lobes on the
lateral projections, matching findings on the ventilation portion of
the exam. The area of involvement is small.
IMPRESSION: 1. Small segmental matched reduced perfusion and ventilation
activity posteriorly in both lower lobes. Appearance reflects low
probability of pulmonary embolus (10 - 19% risk) based on PIOPED 2
criteria.

## 2020-06-02 IMAGING — DX DG KNEE COMPLETE 4+V*L*
4 series · 4 of 4 positions shown · non-contrast
Comparison: None.

CLINICAL DATA: Left knee pain. No reported injury.

EXAM:
LEFT KNEE - COMPLETE 4+ VIEW

[knee ap]
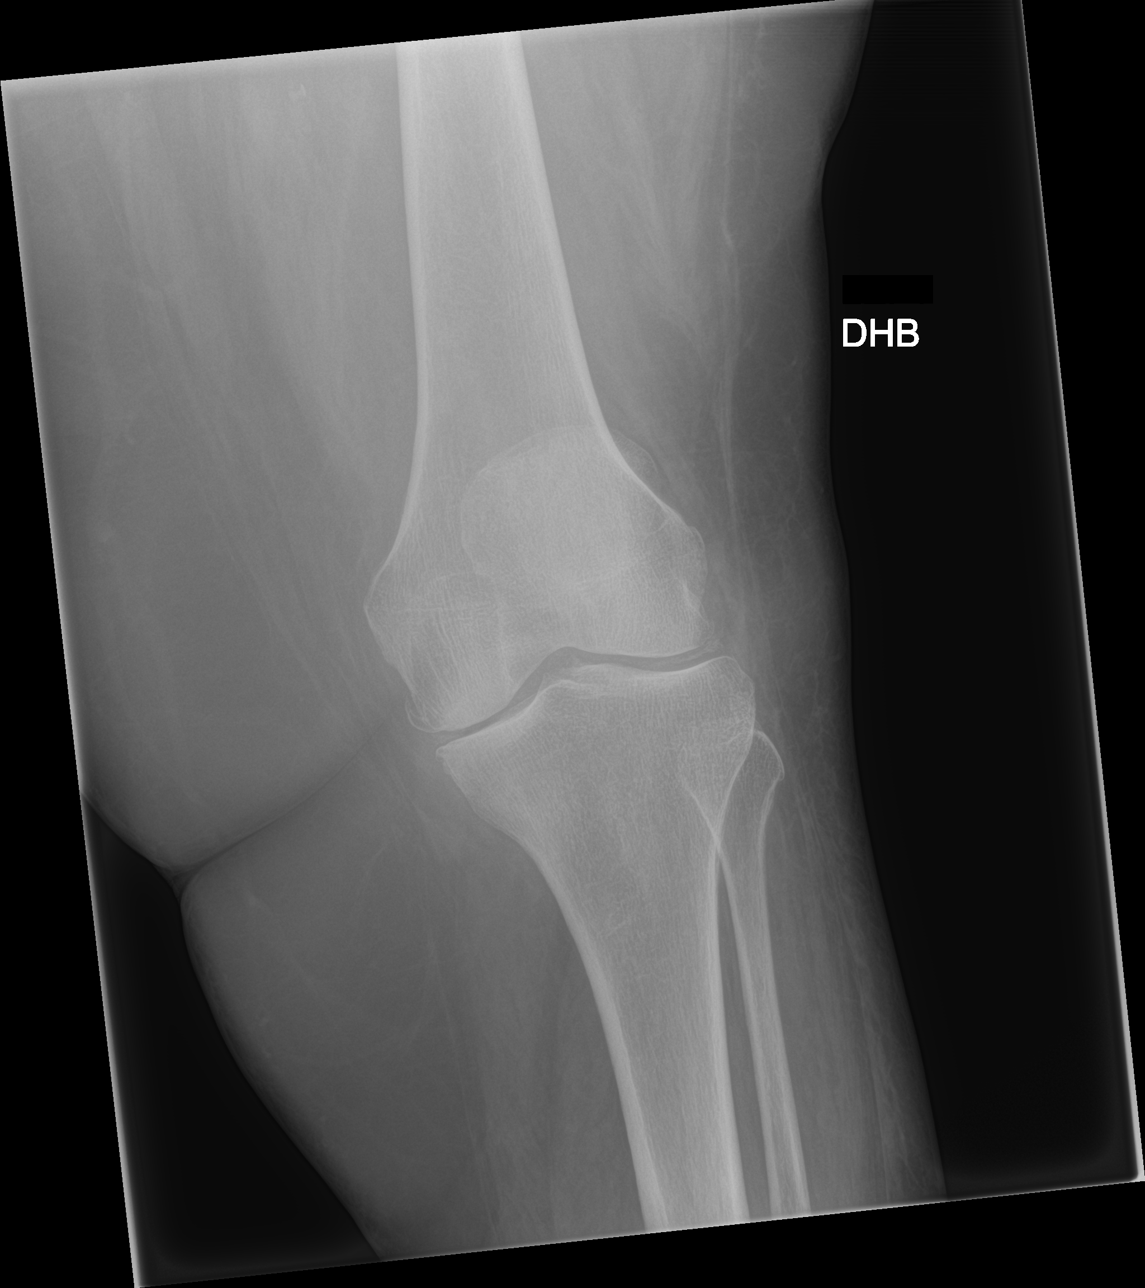

[knee lat]
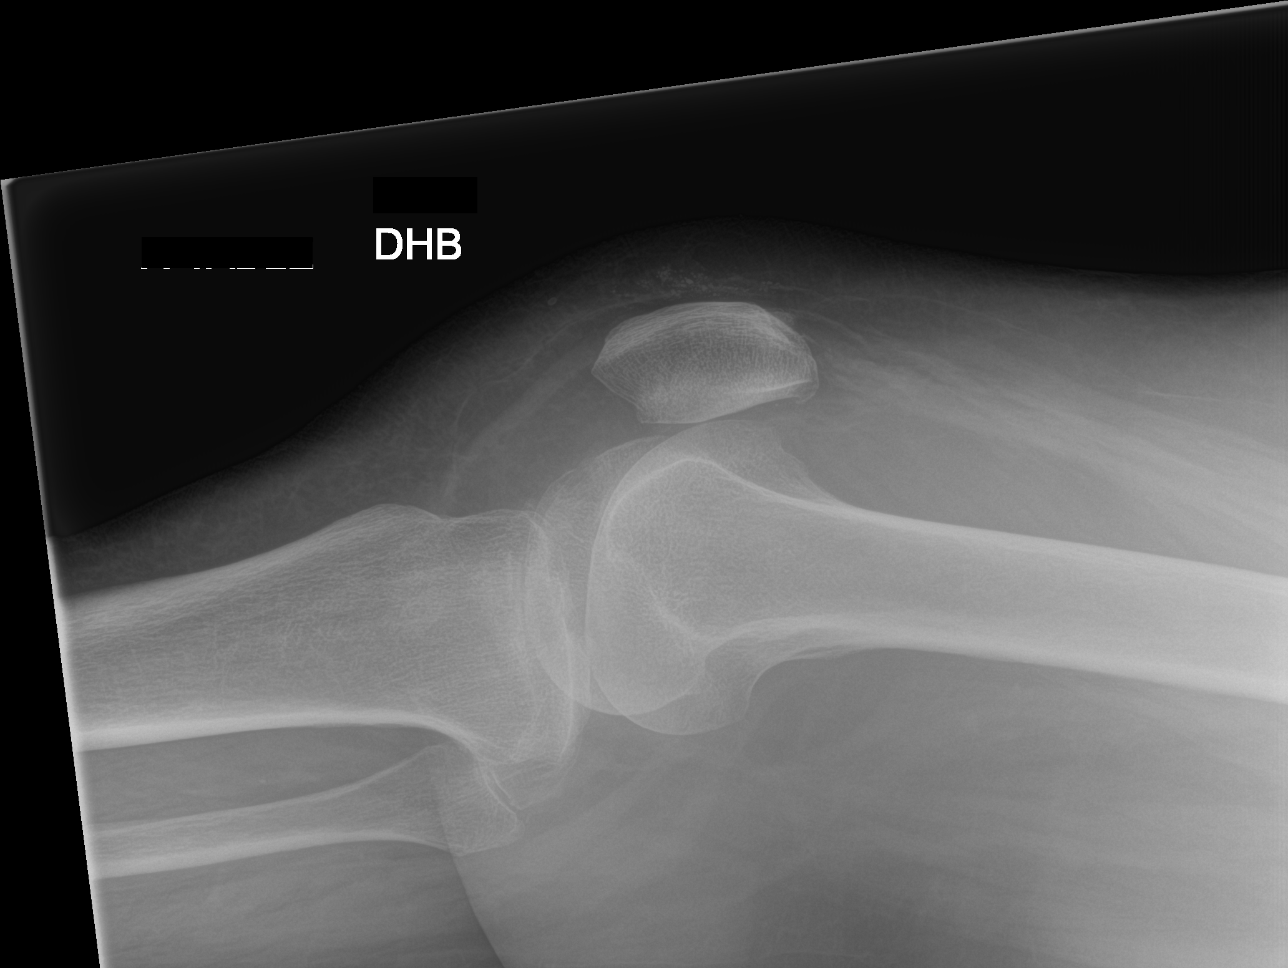

[knee obl (1 of 2)]
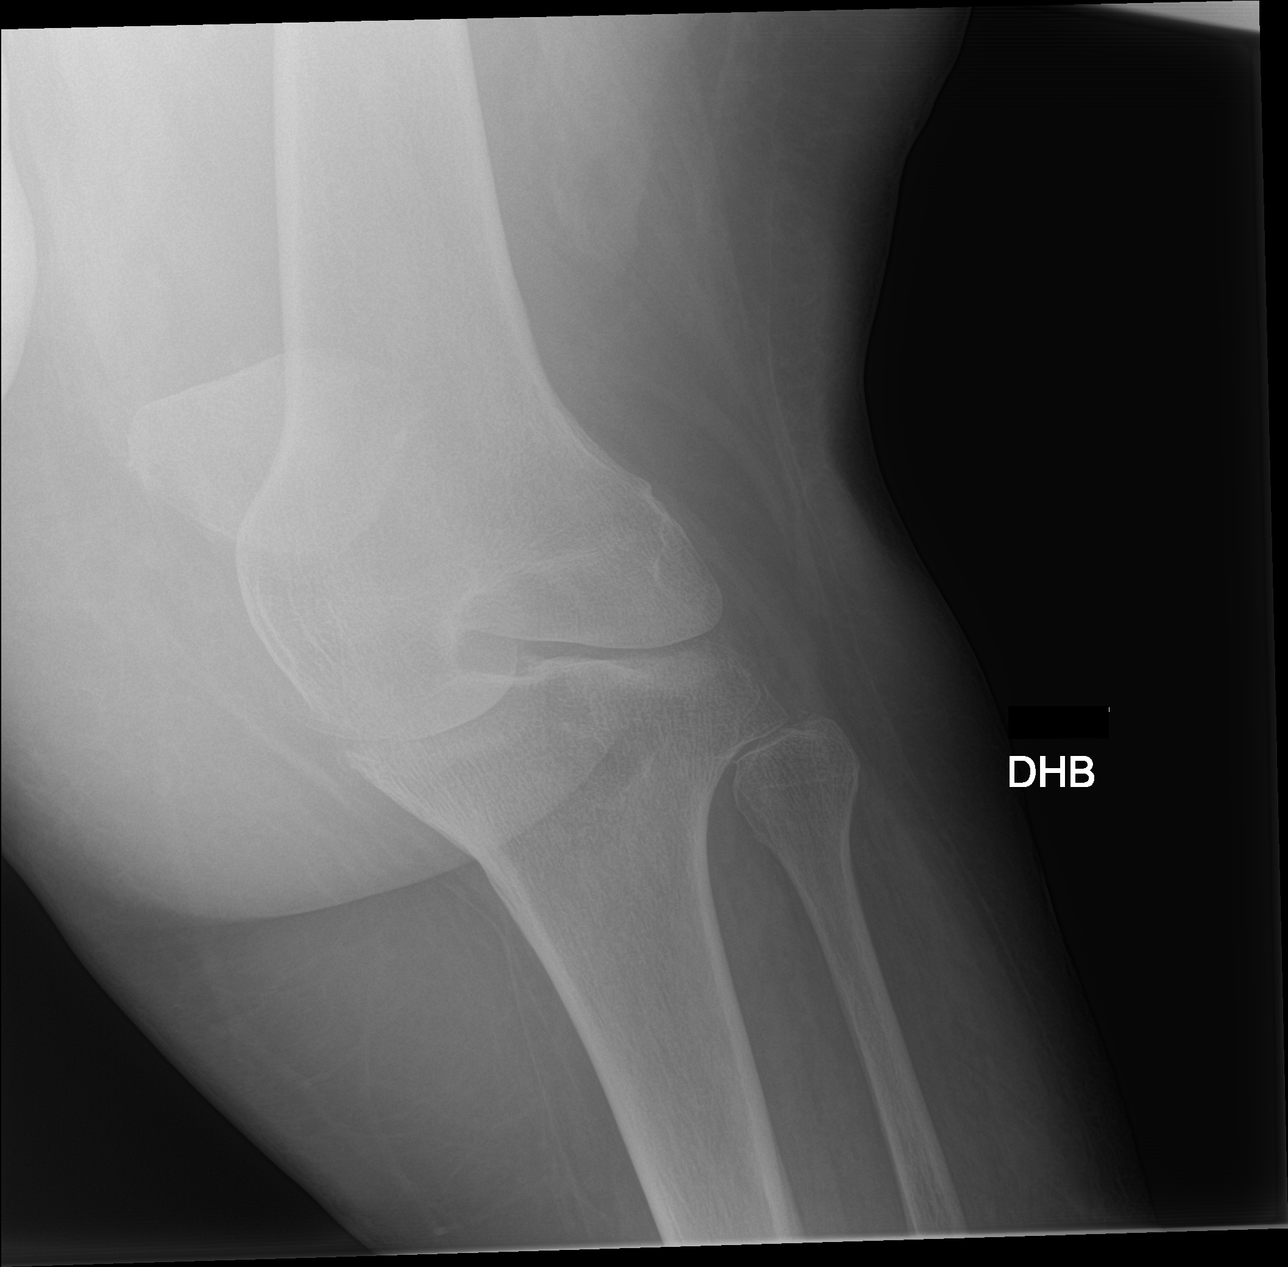

[knee obl (2 of 2)]
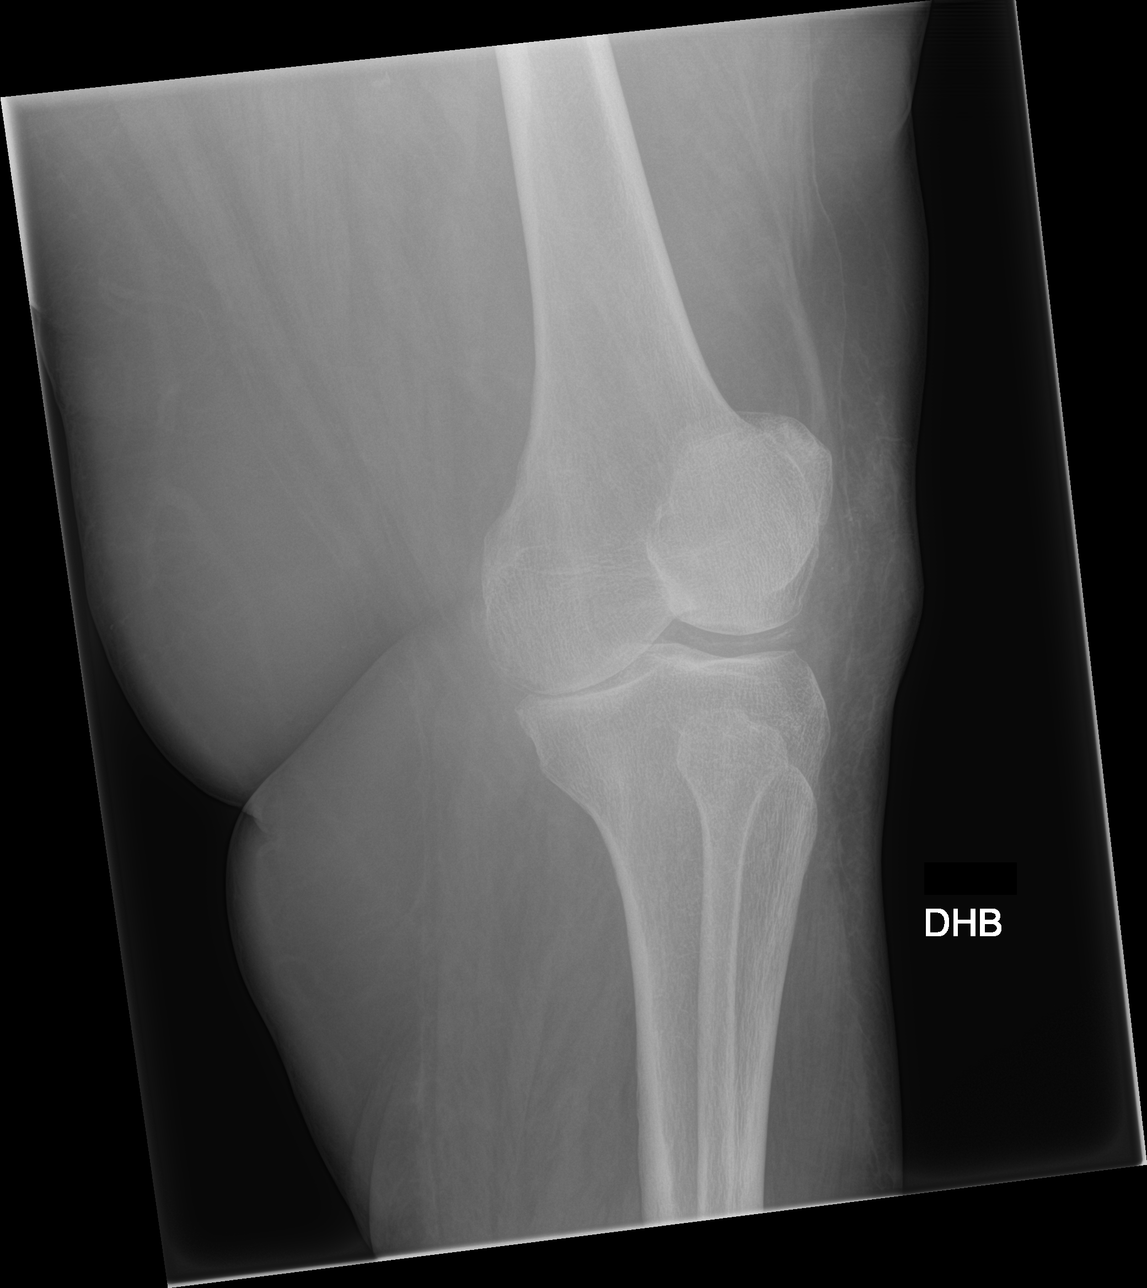

[4 of 4 positions shown; findings below may reference images not displayed]

FINDINGS: Degenerative changes in the left knee with medial greater than
lateral compartment narrowing and small osteophyte formation.
Cartilaginous calcification in the medial and lateral compartments.
No evidence of acute fracture or dislocation. No focal bone lesion
or bone destruction. Bone cortex appears intact. No significant
effusion.
IMPRESSION: Degenerative changes in the left knee. No acute bony abnormalities.

## 2020-06-07 DIAGNOSIS — G4733 Obstructive sleep apnea (adult) (pediatric): Secondary | ICD-10-CM | POA: Diagnosis not present

## 2020-06-07 DIAGNOSIS — I5032 Chronic diastolic (congestive) heart failure: Secondary | ICD-10-CM | POA: Diagnosis not present

## 2020-06-07 DIAGNOSIS — J441 Chronic obstructive pulmonary disease with (acute) exacerbation: Secondary | ICD-10-CM | POA: Diagnosis not present

## 2020-06-10 IMAGING — DX DG CHEST 1V
1 series · 1 of 1 positions shown · non-contrast
Comparison: 11/20/2017

CLINICAL DATA: Shortness of breath.

EXAM:
CHEST  1 VIEW

[chest ap]
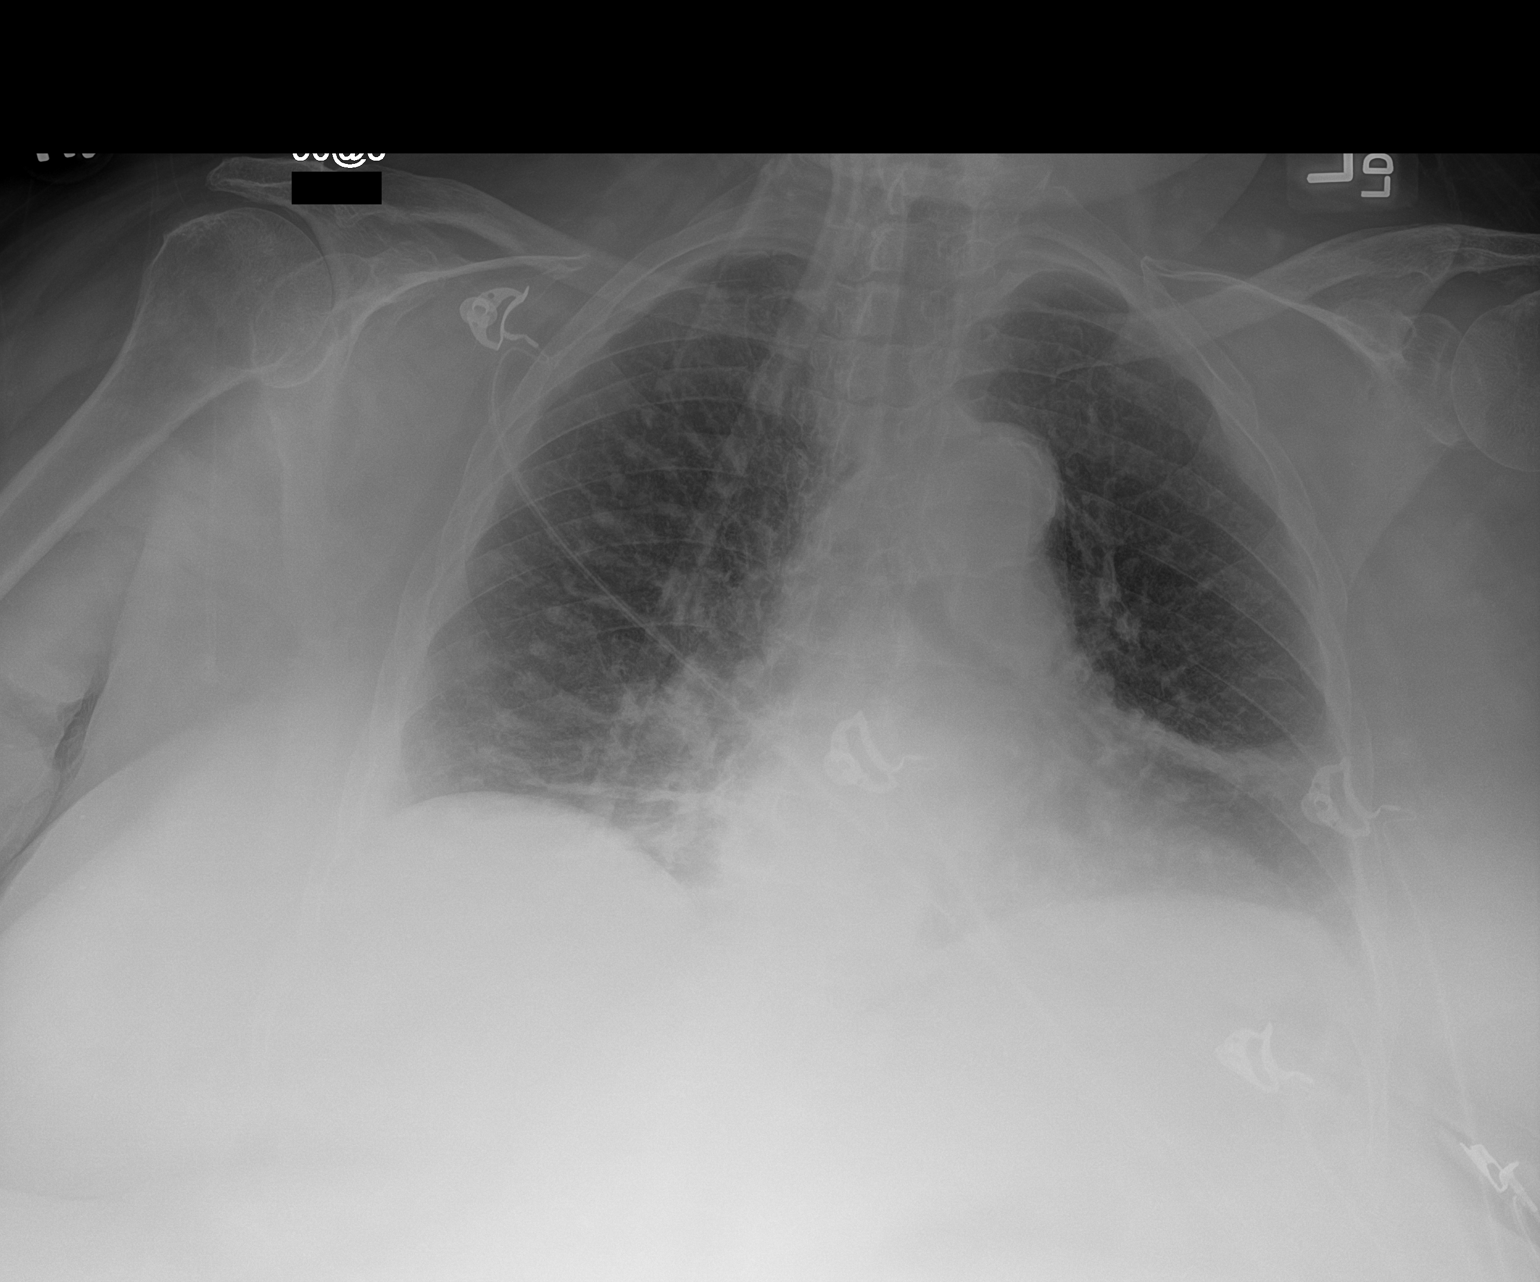

[1 of 1 positions shown; findings below may reference images not displayed]

FINDINGS: Cardiomediastinal silhouette is normal. Mediastinal contours appear
intact. Calcific atherosclerotic disease of the aorta.

Low lung volumes with bilateral lower lobe atelectatic changes. Mild
bilateral pleural thickening versus pleural effusions.

Osseous structures are without acute abnormality. Soft tissues are
grossly normal.
IMPRESSION: Low lung volumes with bilateral lower lobe atelectatic changes.

Mild bilateral pleural thickening versus pleural effusions.

## 2020-06-12 IMAGING — CR DG CHEST 2V
1 series · 2 of 2 positions shown · non-contrast
Comparison: 11/29/2017 and 10/26/2017

CLINICAL DATA: 77-year-old with COPD exacerbation. Shortness of
breath.

EXAM:
CHEST - 2 VIEW

[Series 1: dg chest 2 view · 0.14mm/px · 2 of 2 slices shown]
[im 1/2]
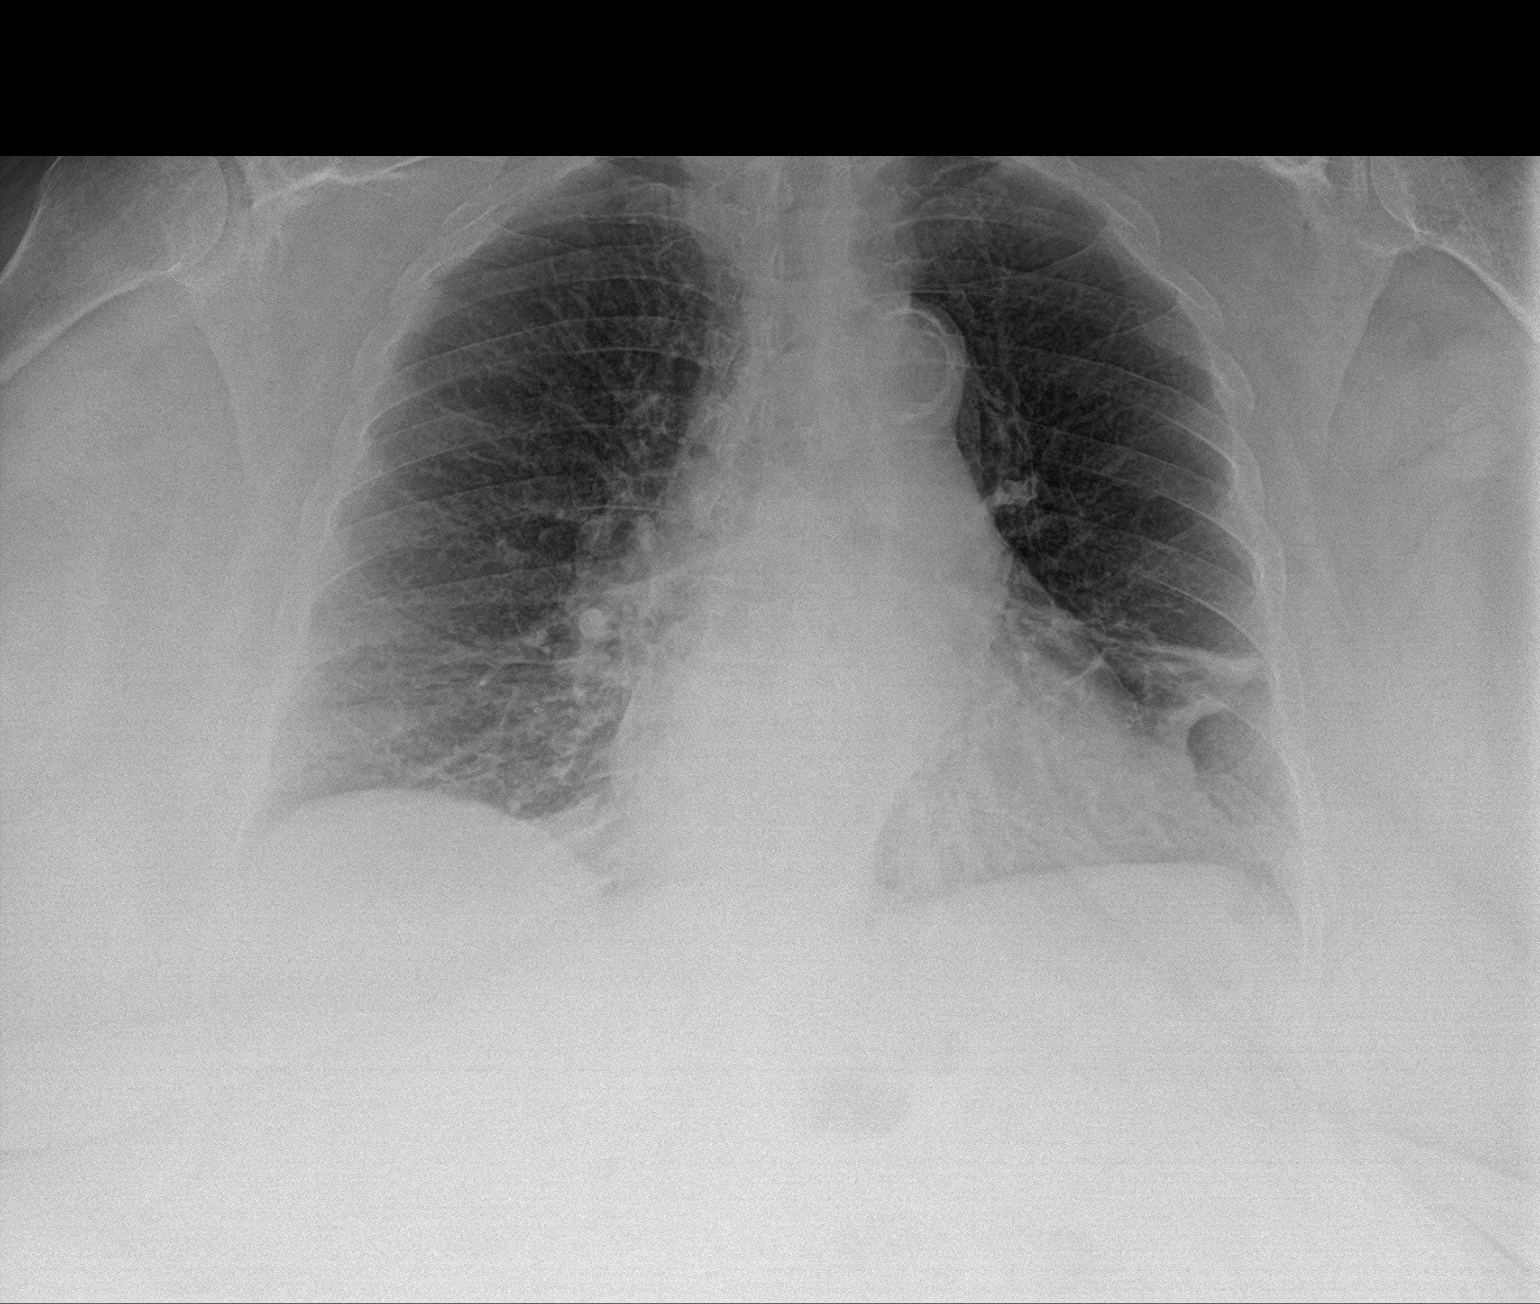
[im 2/2]
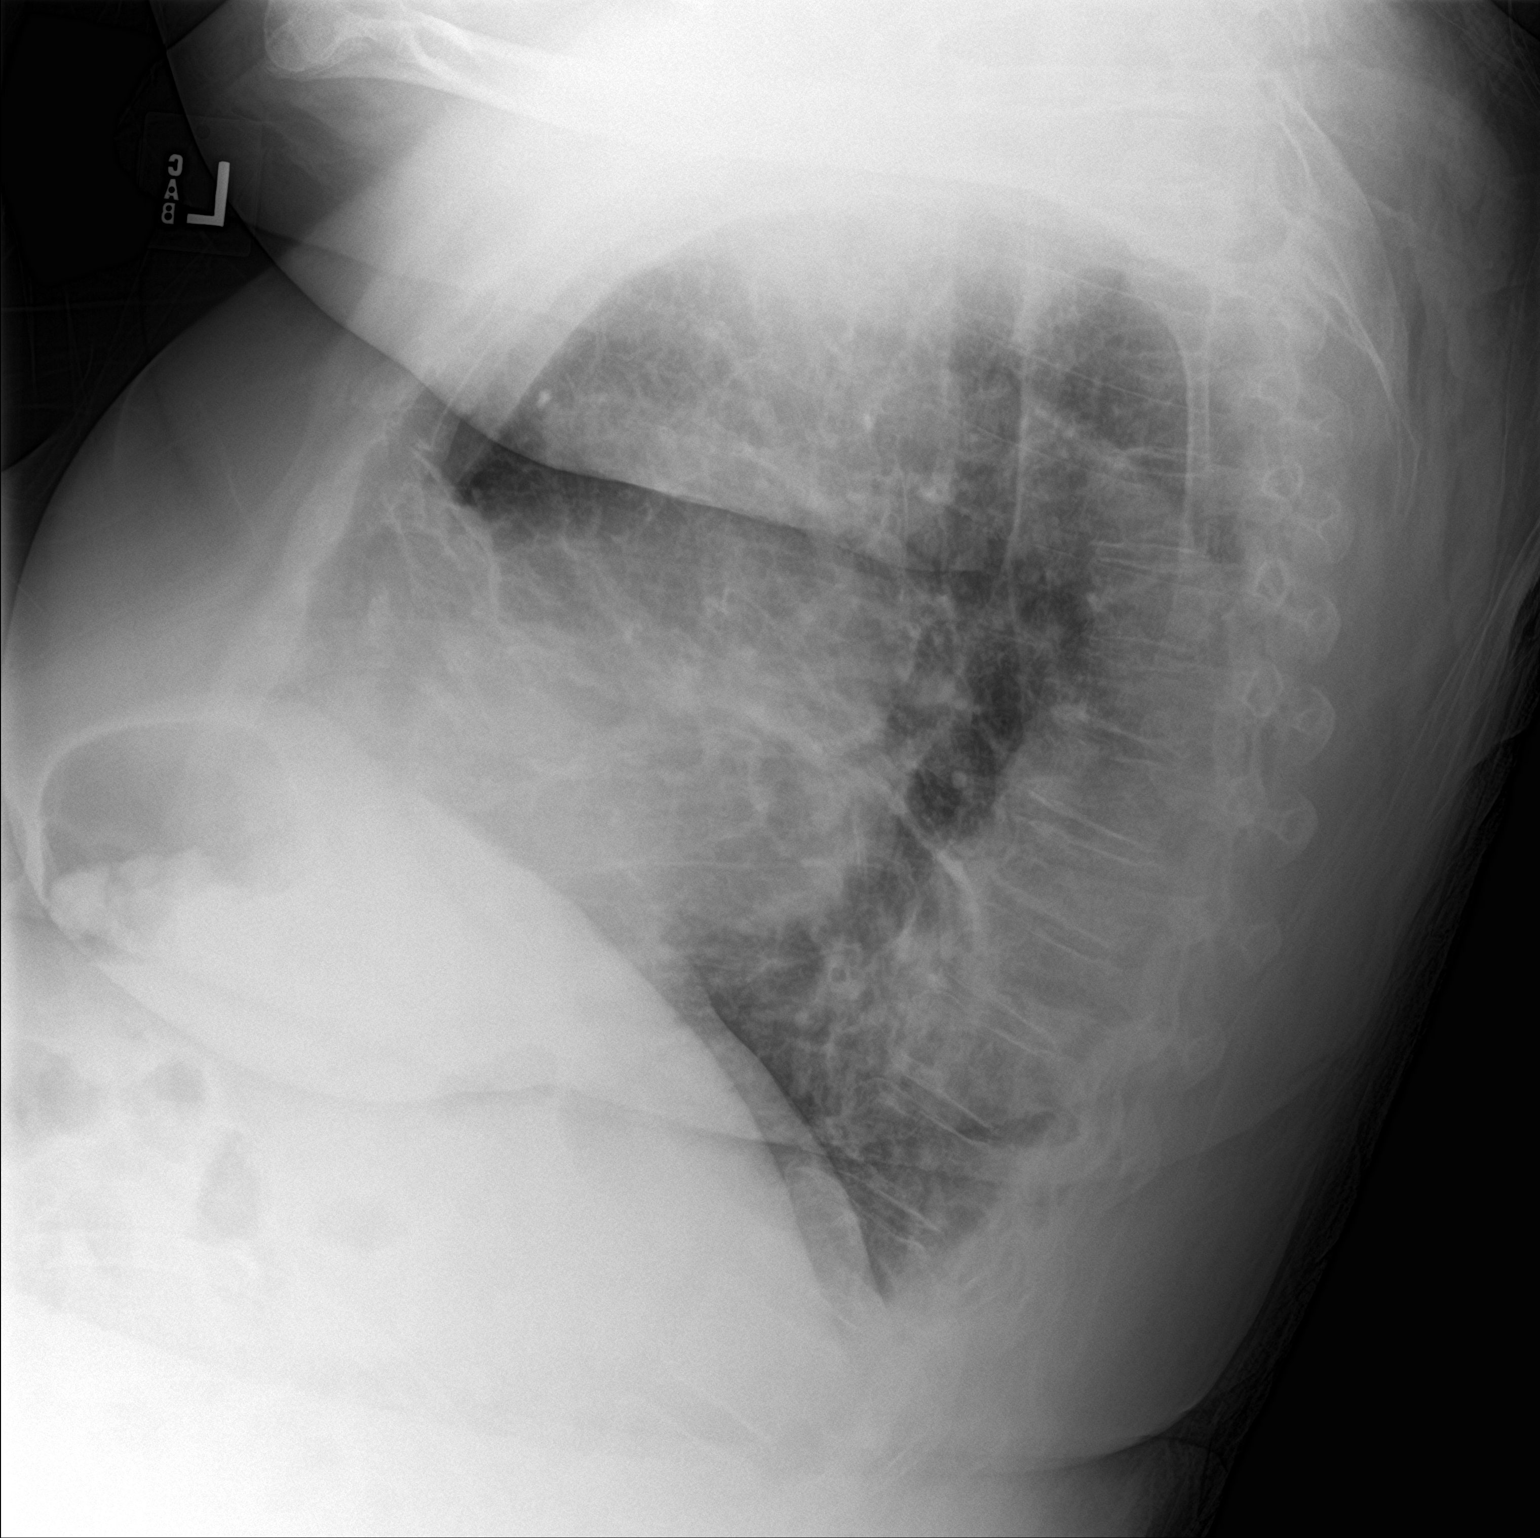

[2 of 2 positions shown; findings below may reference images not displayed]

FINDINGS: Coarse lung markings appear chronic. Linear and bandlike densities
in left lower chest are similar to the recent comparison exam and
most compatible with atelectasis. The upper lungs are clear.
Slightly improved aeration at the right lung base. Heart size is
within normal limits and stable. Atherosclerotic calcifications at
the aortic arch. Negative for a pneumothorax. No acute bone
abnormality. No significant pleural fluid.
IMPRESSION: 1. Persistent densities in left lower chest. Findings are most
compatible with atelectasis.
2. Slightly improved aeration at the right lung base.
3. Chronic lung changes.

## 2020-07-05 IMAGING — DX DG CHEST 1V
1 series · 1 of 1 positions shown · non-contrast
Comparison: 12/01/2017, 11/29/2017

CLINICAL DATA: Hemoptysis

EXAM:
CHEST  1 VIEW

[chest ap]
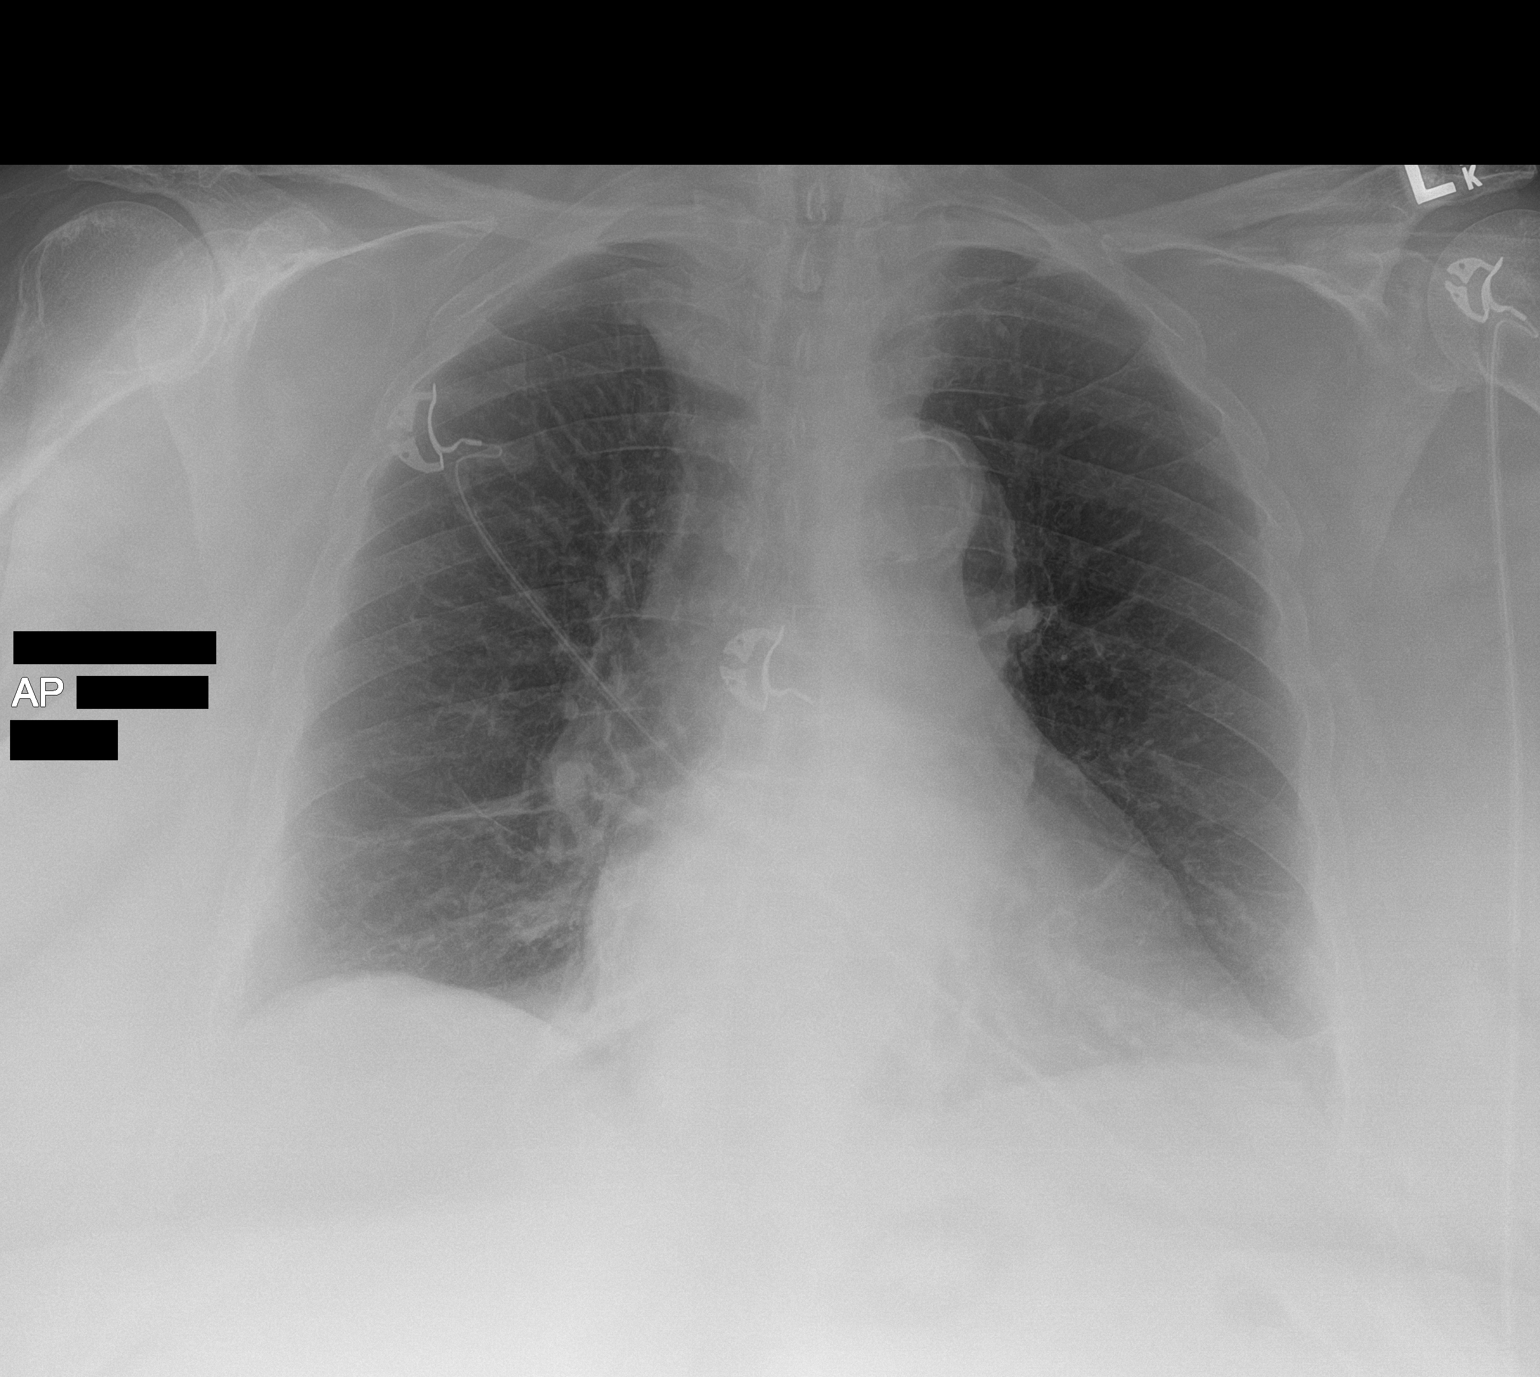

[1 of 1 positions shown; findings below may reference images not displayed]

FINDINGS: Mild cardiomegaly with aortic atherosclerosis. Linear scarring
within the bilateral lung bases. No acute consolidation or effusion.
No pneumothorax.
IMPRESSION: No active disease.  Cardiomegaly.

## 2020-07-06 IMAGING — CT CT ANGIO CHEST
2 of 6 series · 18 of 46 positions shown · IV contrast (APPLIED)
Comparison: Chest CT 10/08/2017

CLINICAL DATA: Shortness of breath

EXAM:
CT ANGIOGRAPHY CHEST WITH CONTRAST
TECHNIQUE: Multidetector CT imaging of the chest was performed using the
standard protocol during bolus administration of intravenous
contrast. Multiplanar CT image reconstructions and MIPs were
obtained to evaluate the vascular anatomy.
CONTRAST:  60mL OMNIPAQUE IOHEXOL 350 MG/ML SOLN, 50mL OMNIPAQUE
IOHEXOL 350 MG/ML SOLN

[Series 10: thins · axial · 0.69mm/px · z∈[-1058,-831]mm · 16 of 249 slices shown]
[im 11/249  lung]
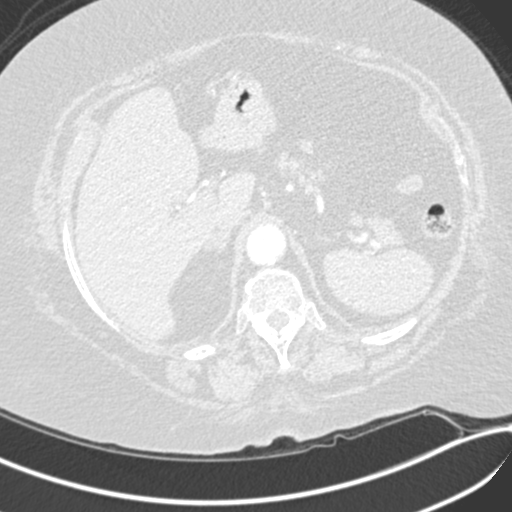
[im 33/249  soft-tissue]
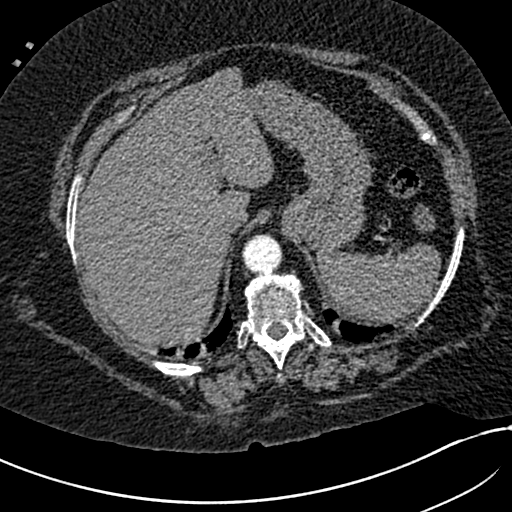
[im 44/249  lung]
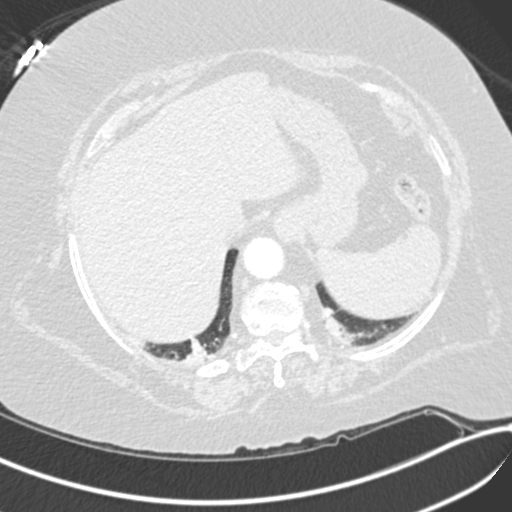
[im 54/249  soft-tissue]
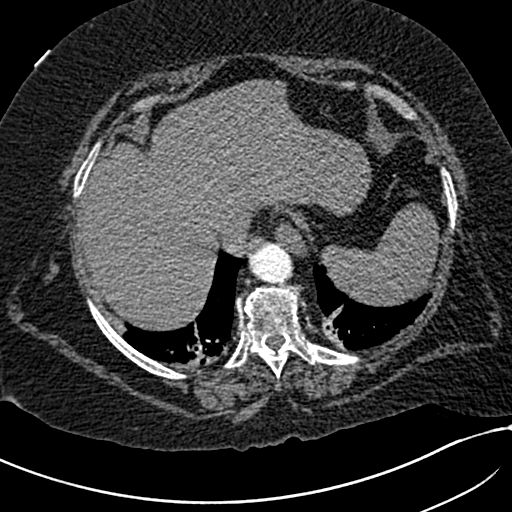
[im 76/249  lung]
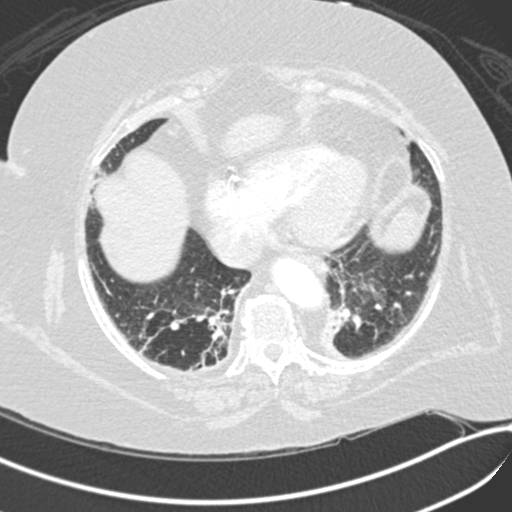
[im 87/249  soft-tissue]
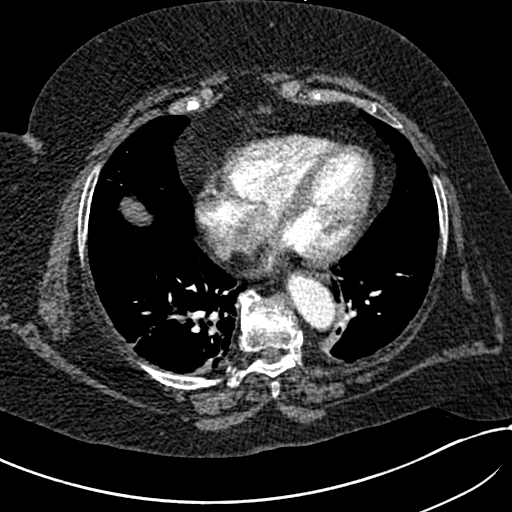
[im 98/249  lung]
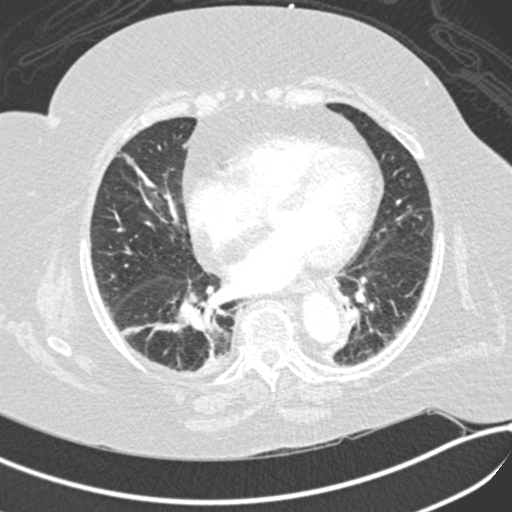
[im 119/249  soft-tissue]
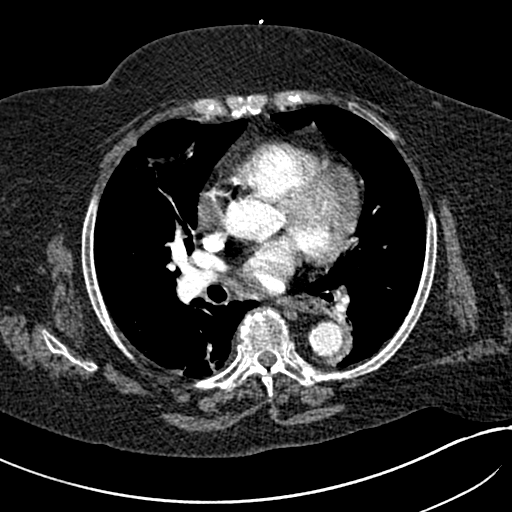
[im 130/249  lung]
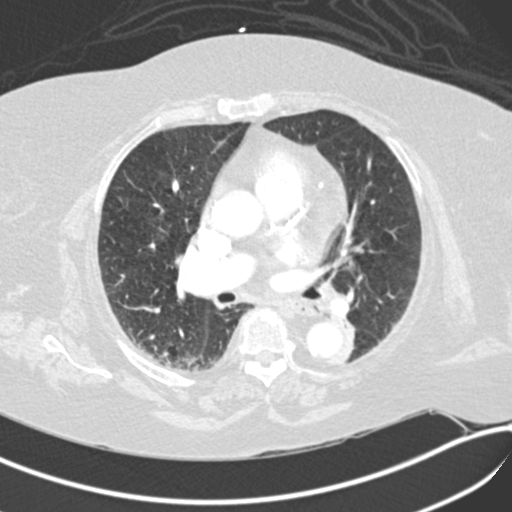
[im 151/249  soft-tissue]
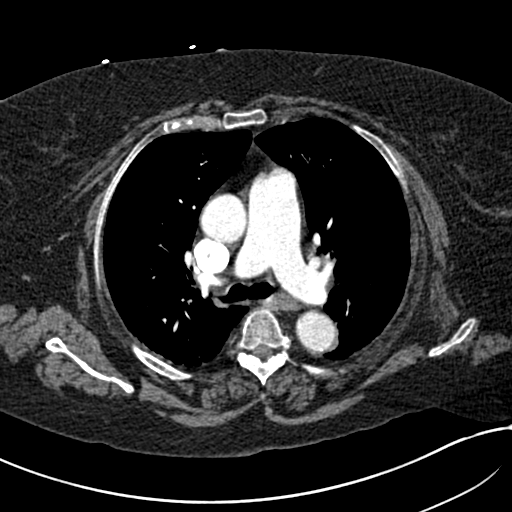
[im 162/249  lung]
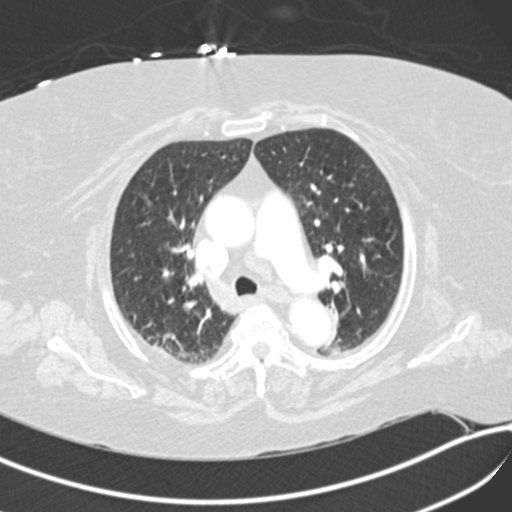
[im 173/249  soft-tissue]
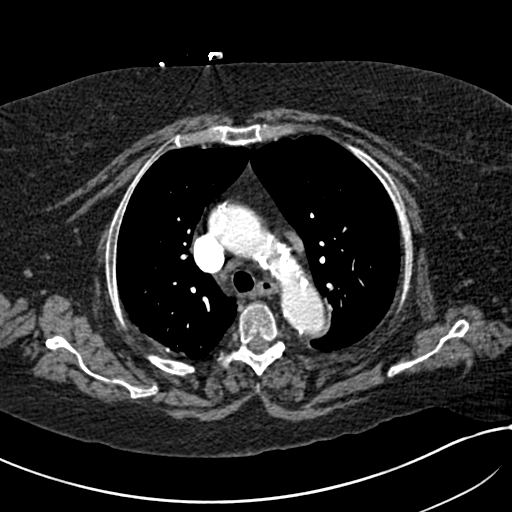
[im 195/249  lung]
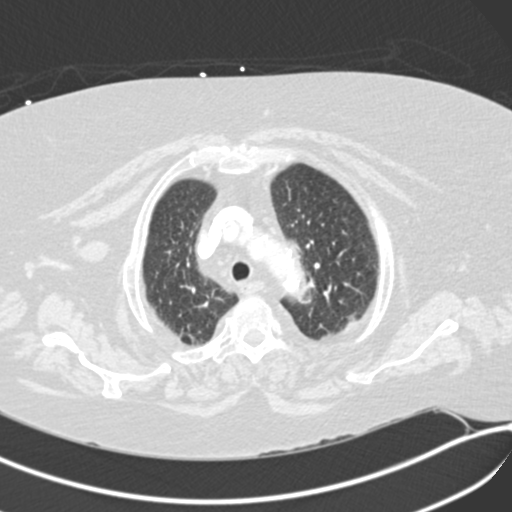
[im 205/249  soft-tissue]
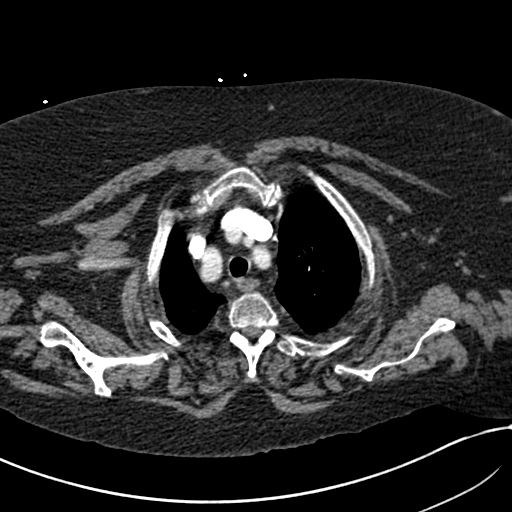
[im 216/249  lung]
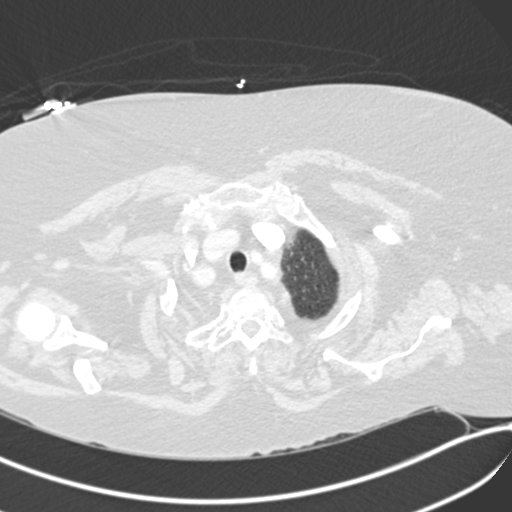
[im 238/249  soft-tissue]
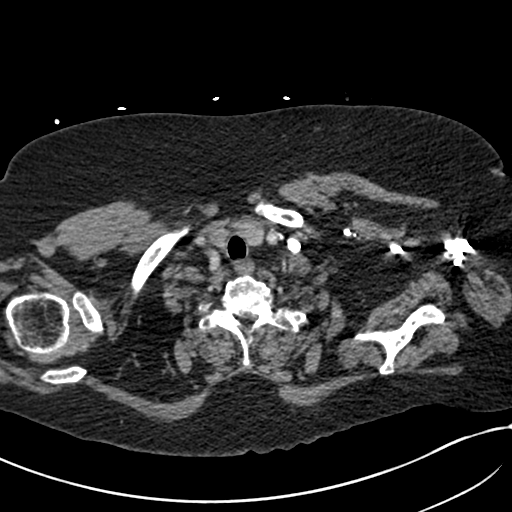

[Series 12: coronal mpr · coronal · 0.49mm/px · 2 of 84 slices shown]
[im 28/84  soft-tissue]
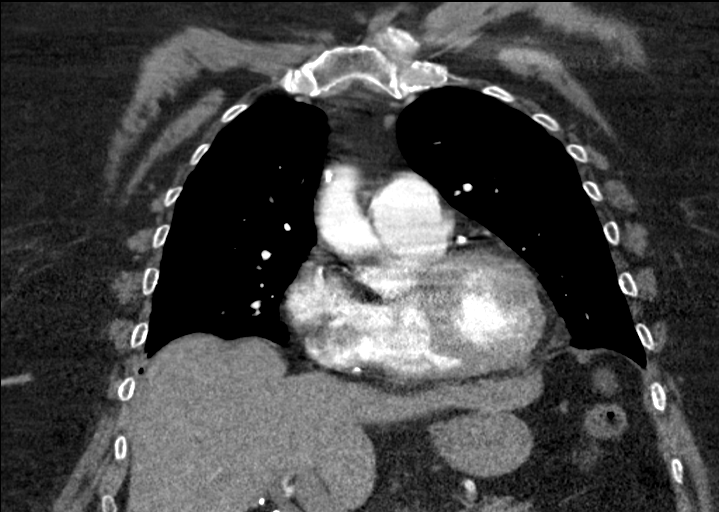
[im 56/84  soft-tissue]
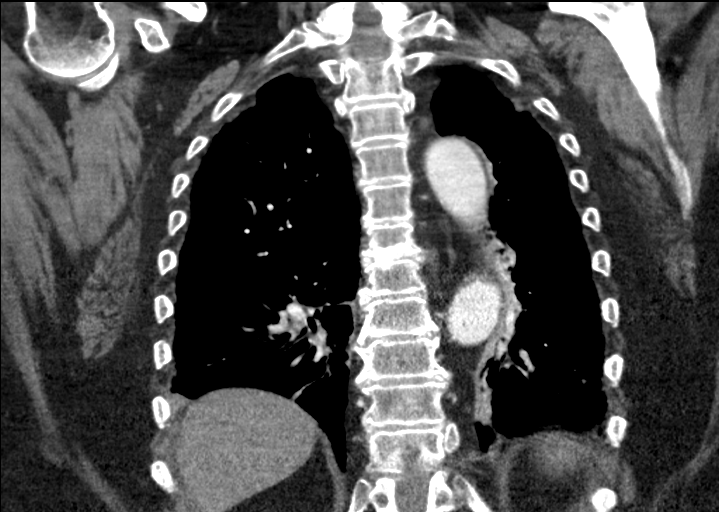

[18 of 46 positions shown; findings below may reference images not displayed]

FINDINGS: Cardiovascular:

--Pulmonary arteries: Contrast injection is sufficient to
demonstrate satisfactory opacification of the pulmonary arteries to
the segmental level. There is no pulmonary embolus. The main
pulmonary artery is mildly enlarged, measuring 3.5 cm.

--Aorta: Satisfactory opacification of the thoracic aorta. No aortic
dissection or other acute aortic syndrome. Conventional 3 vessel
aortic branching pattern. The aortic course and caliber are normal.
There is mild aortic atherosclerosis.

--Heart: Mild cardiomegaly. No pericardial effusion.

Mediastinum/Nodes: No mediastinal, hilar or axillary
lymphadenopathy. The visualized thyroid and thoracic esophageal
course are unremarkable.

Lungs/Pleura: No pulmonary nodules or masses. No pleural effusion or
pneumothorax. No focal airspace consolidation. No focal pleural
abnormality. There is bibasilar atelectasis.

Upper Abdomen: Contrast bolus timing is not optimized for evaluation
of the abdominal organs. Within this limitation, the visualized
organs of the upper abdomen are normal.

Musculoskeletal: No chest wall abnormality. No acute or significant
osseous findings.

Review of the MIP images confirms the above findings.
IMPRESSION: 1. No pulmonary embolus or acute aortic syndrome.
2. Mildly enlarged main pulmonary artery, which may indicate
pulmonary hypertension.

Aortic Atherosclerosis ([id number]).

## 2020-07-07 IMAGING — RF DG ESOPHAGUS
3 series · 9 of 9 positions shown · non-contrast
Comparison: None.

CLINICAL DATA: Dysphagia intermittently.

EXAM:
ESOPHOGRAM/BARIUM SWALLOW
TECHNIQUE: Single contrast examination was performed using thin barium or water
soluble. A barium tablet was administered.
FLUOROSCOPY TIME:  Fluoroscopy Time:  1 minutes, 12 seconds
Radiation Exposure Index (if provided by the fluoroscopic device):
41 mGy
Number of Acquired Spot Images: 0

[Series 1: fluoro_barium 2fps_bw · 0.18mm/px · 4 of 20 frames shown (1 of 3)]
[frame 1/20]
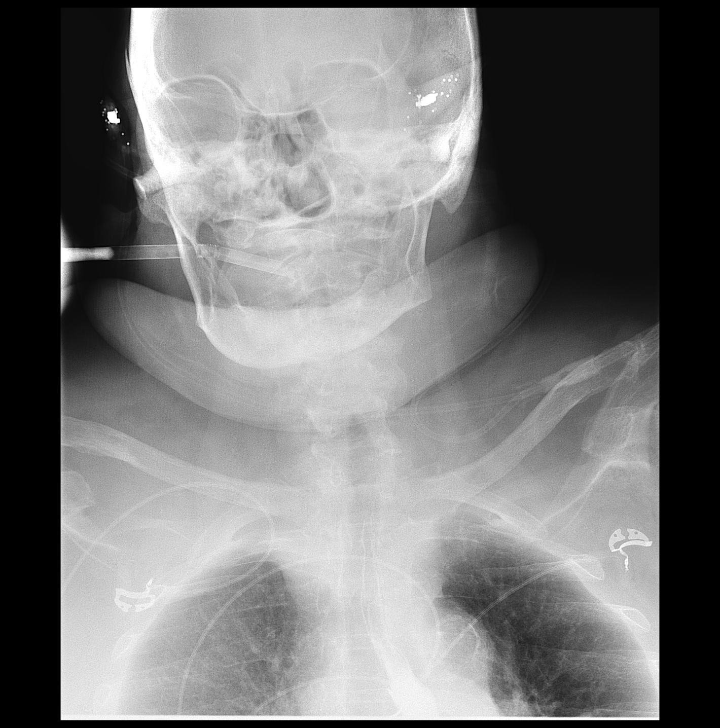
[frame 4/20]
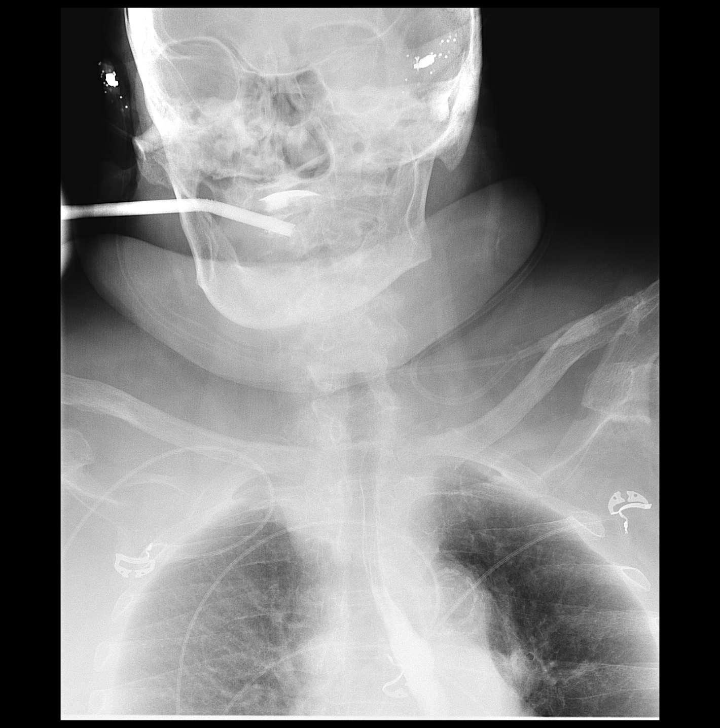
[frame 11/20]
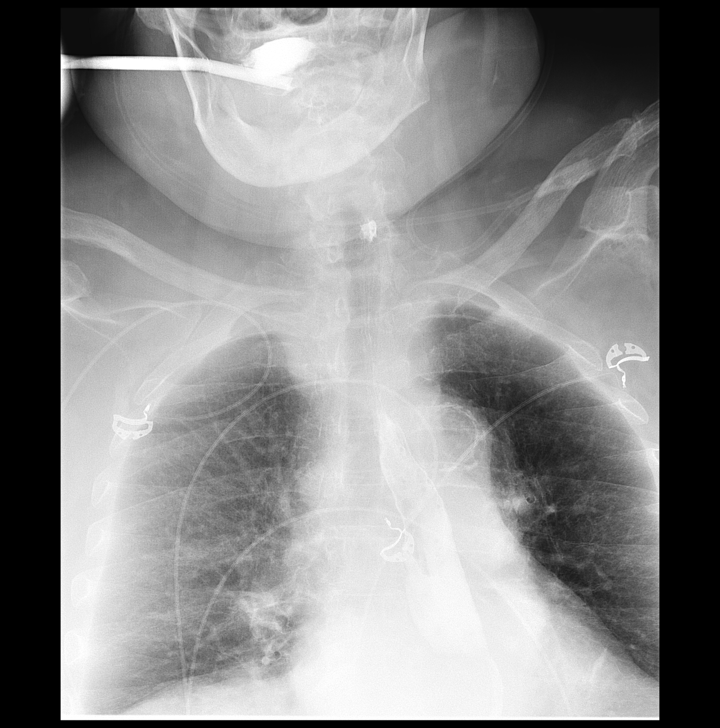
[frame 18/20]
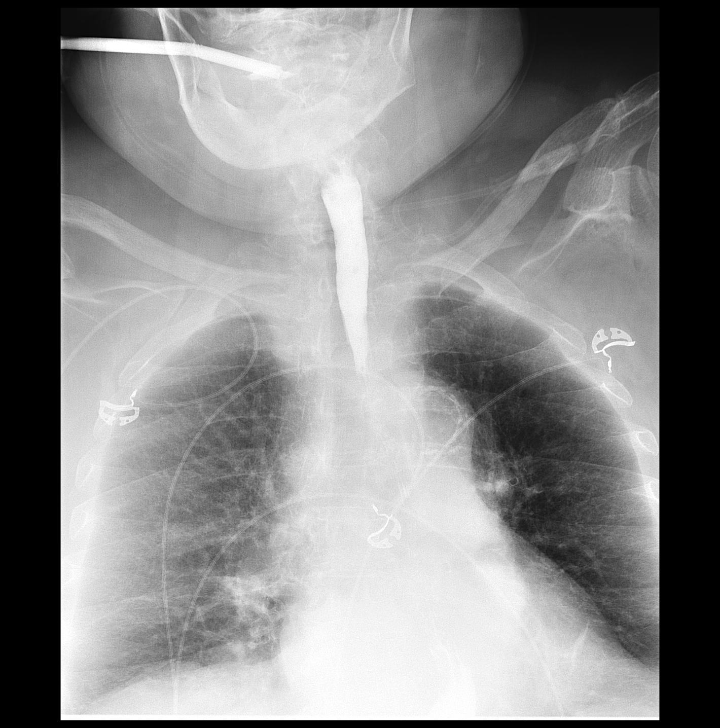

[Series 2: fluoro_barium 2fps_bw · 0.18mm/px · 4 of 11 frames shown (2 of 3)]
[frame 2/11]
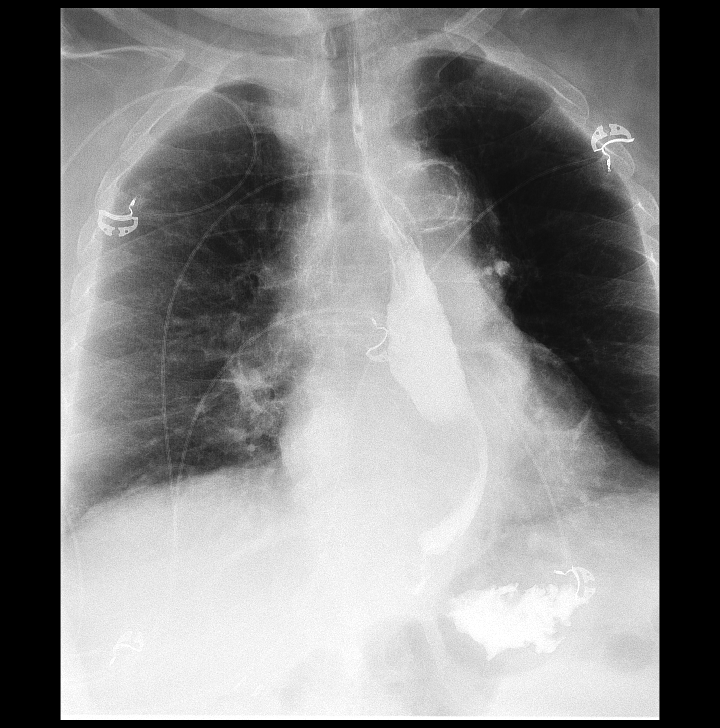
[frame 4/11]
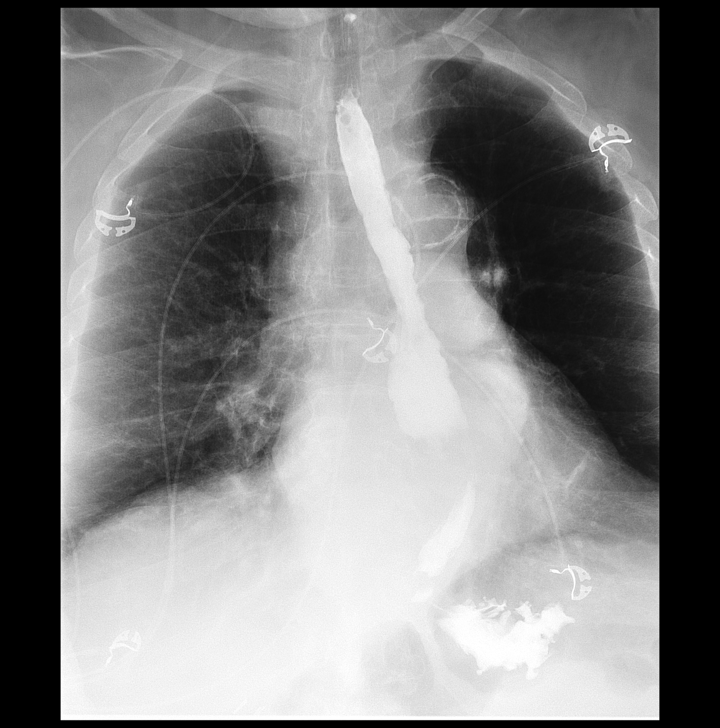
[frame 6/11]
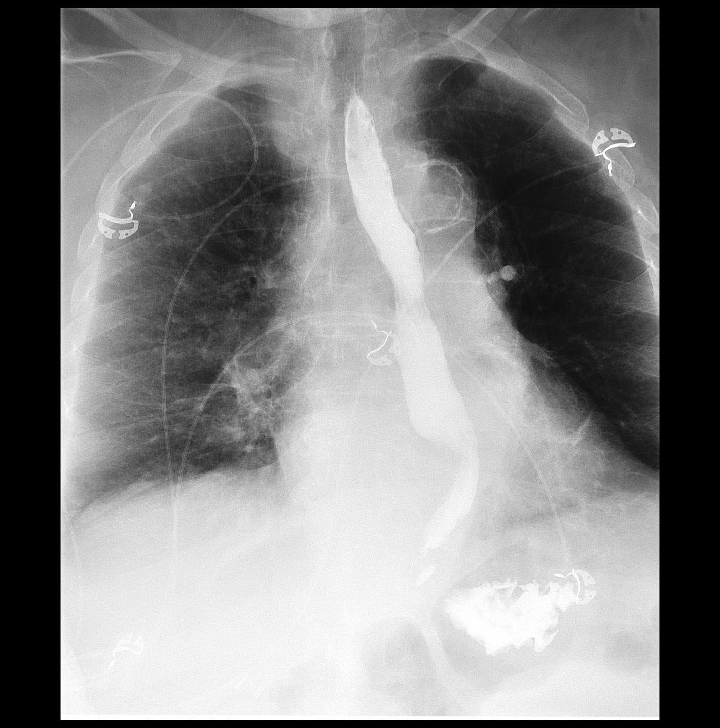
[frame 10/11]
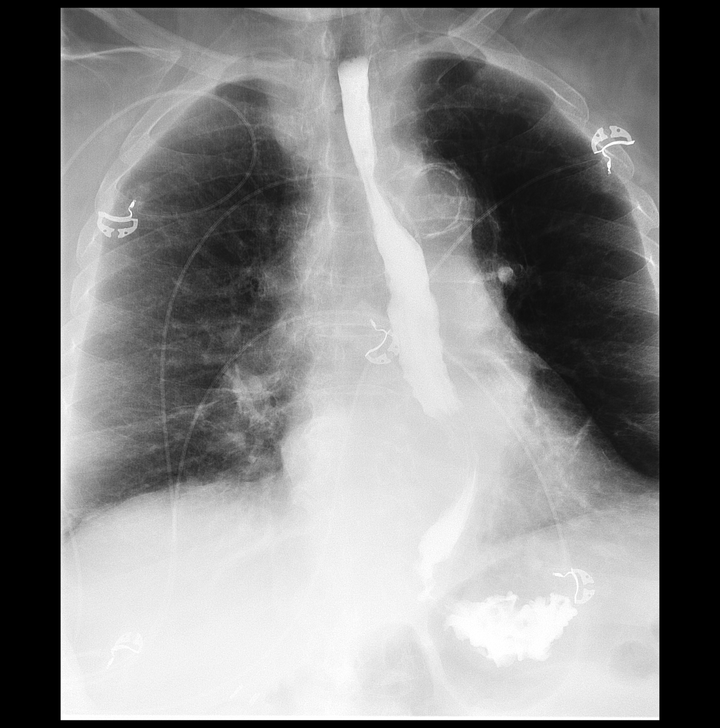

[Series 3: fluoro_barium 2fps_bw · 0.18mm/px · 1 of 1 slices shown (3 of 3)]
[im 1/1]
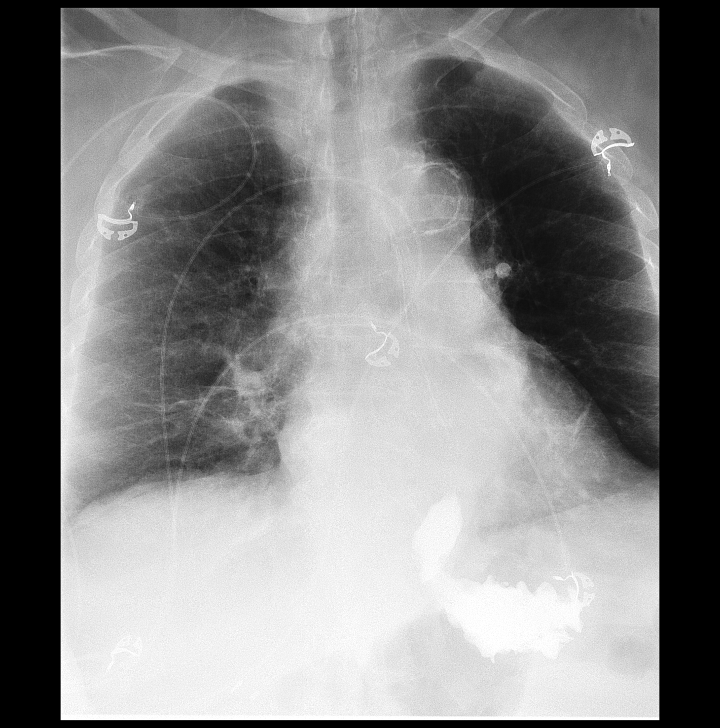

[9 of 9 positions shown; findings below may reference images not displayed]

FINDINGS: The examination was performed with the patient in the semi supine
position due to her clinical status. She was not able to stand. The
patient ingested thin barium without difficulty. There was no
laryngeal penetration of the barium. Initiation of the voluntary
portion of the swallowing maneuver was normal. No narrowing of the
cervical esophagus was observed. The thoracic esophagus distended
well. Esophageal motility was within the limits of normal for age.
The barium tablet passed promptly from the mouth to the distal third
of the esophagus. There it moved slowly toward the GE junction but
never passed to the GE junction and was allowed to dissolve. No
visible stricture was observed.
IMPRESSION: No obstruction to passage of the liquid barium. No reflux was
observed. The barium tablet however passed to the distal third of
the esophagus but would not pass further, though I suspect that this
was related to the patient's semi supine positioning.

If the patient's intermittent dysphagia should worsen in the future,
direct visualization would be a useful next imaging step.

## 2020-07-07 IMAGING — US US EXTREM LOW VENOUS BILAT
1 series · 12 of 24 positions shown · non-contrast
Comparison: None.

CLINICAL DATA: 77-year-old female with lower extremity swelling and
elevated D-dimer



[Series 1: us extrem low venous bilat · 12 of 65 slices shown]
[im 3/65]
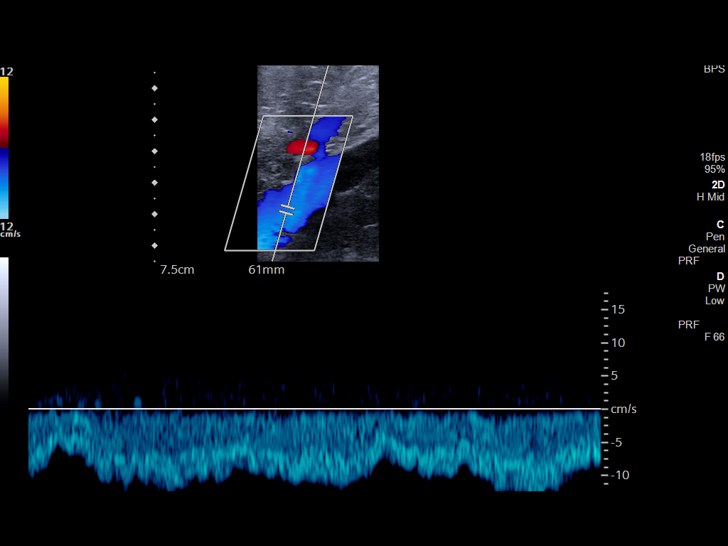
[im 9/65]
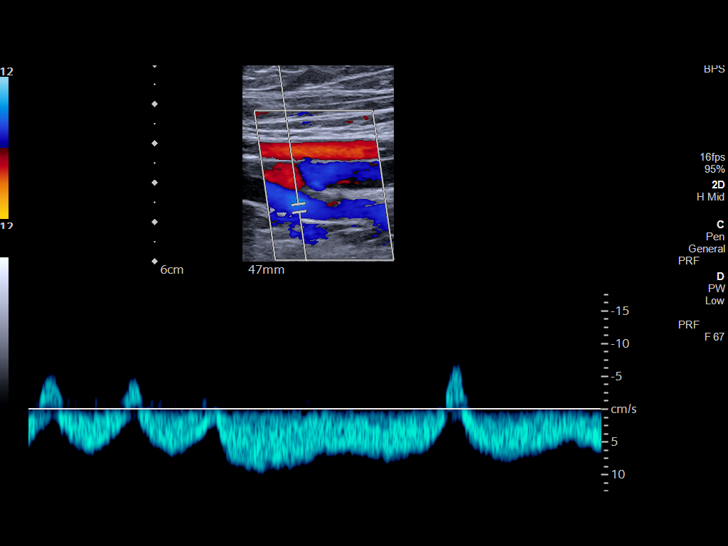
[im 14/65]
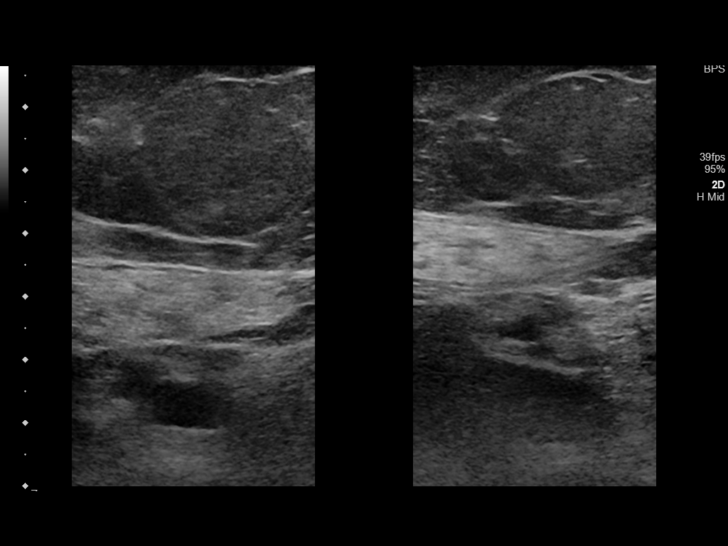
[im 20/65]
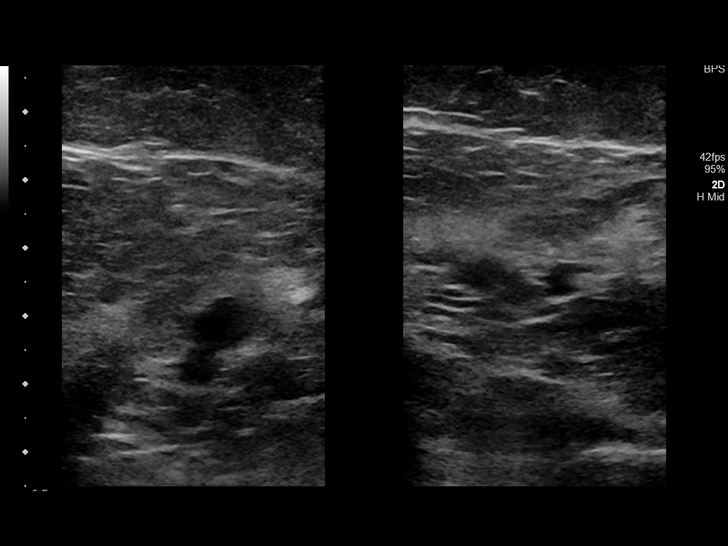
[im 26/65]
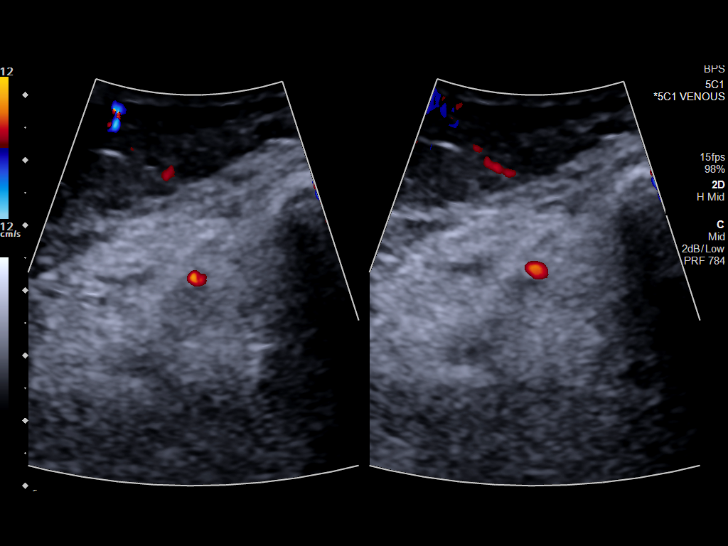
[im 31/65]
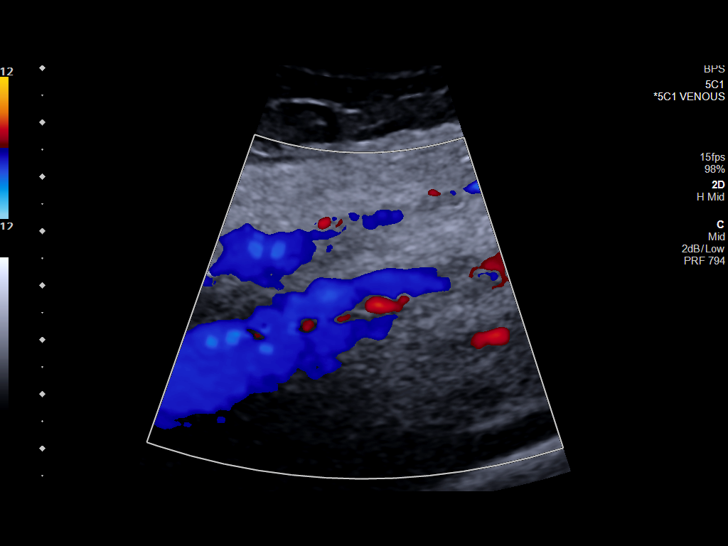
[im 37/65]
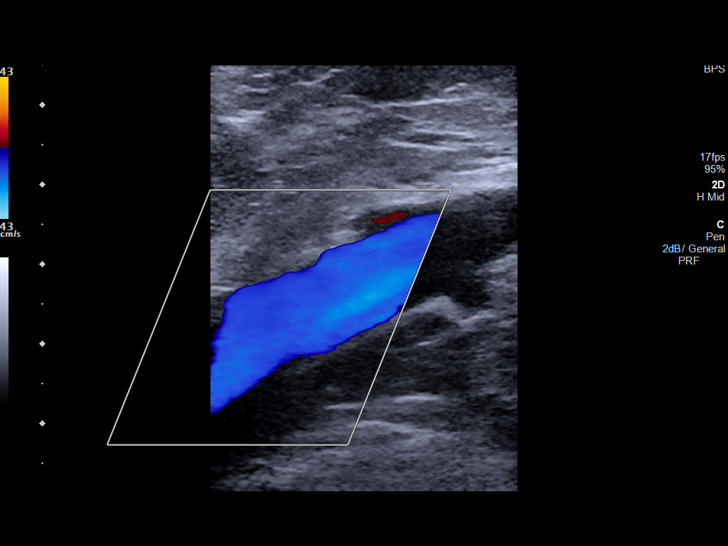
[im 42/65]
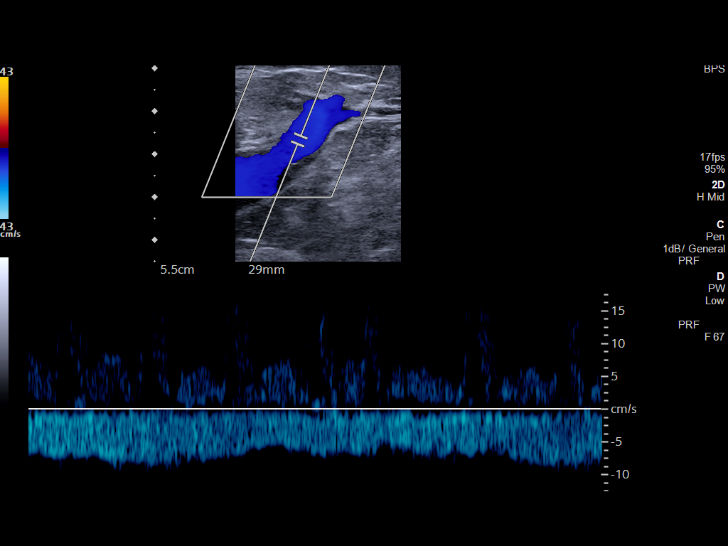
[im 48/65]
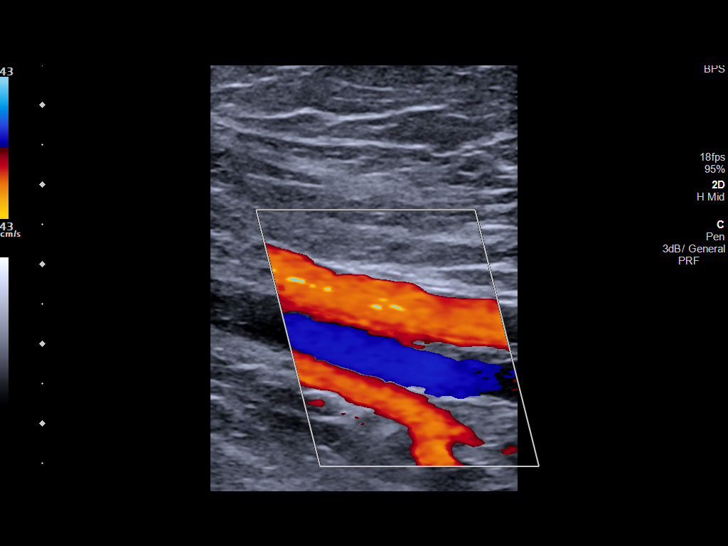
[im 53/65]
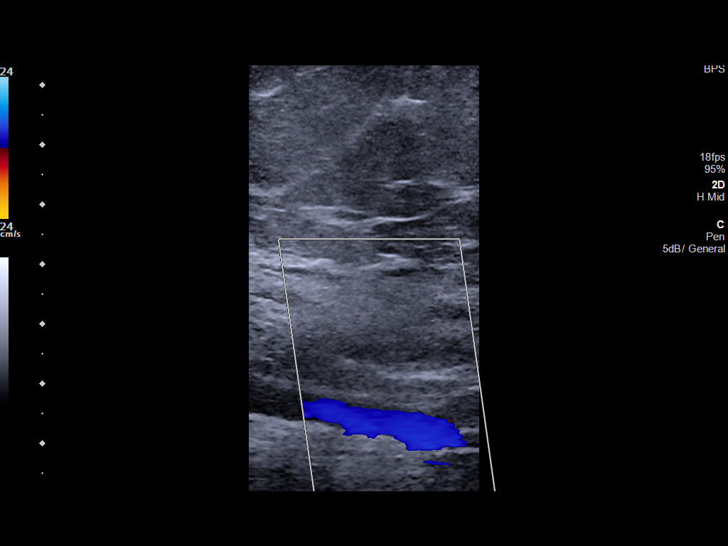
[im 59/65]
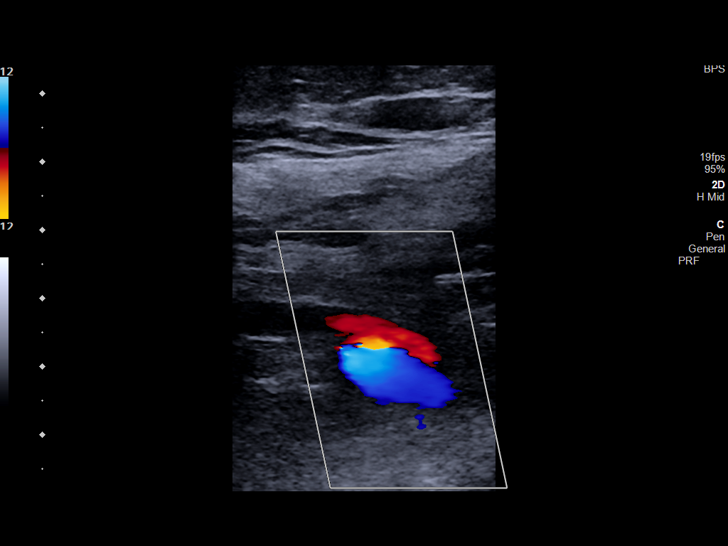
[im 65/65]
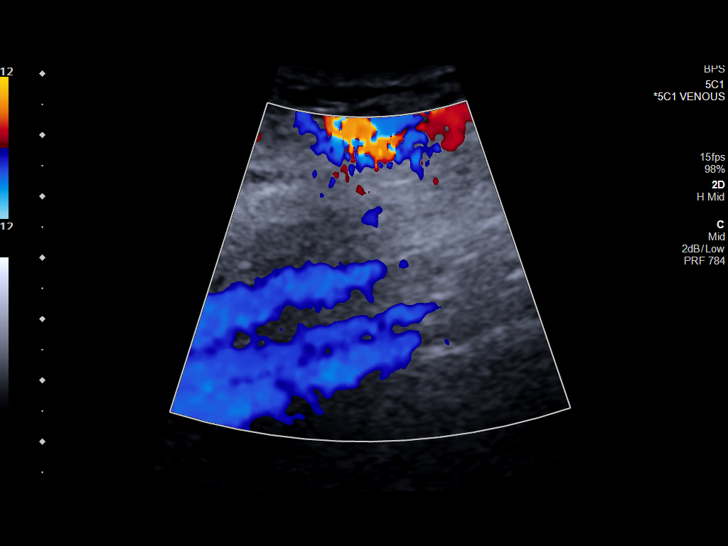

[12 of 24 positions shown; findings below may reference images not displayed]

FINDINGS: RIGHT LOWER EXTREMITY

Common Femoral Vein: No evidence of thrombus. Normal
compressibility, respiratory phasicity and response to augmentation.

Saphenofemoral Junction: The vessel is incompletely compressible.
There is some eccentric wall thickening consistent with nonocclusive
DVT.

Profunda Femoral Vein: No evidence of thrombus. Normal
compressibility and flow on color Doppler imaging.

Femoral Vein: No evidence of thrombus. Normal compressibility,
respiratory phasicity and response to augmentation.

Popliteal Vein: No evidence of thrombus. Normal compressibility,
respiratory phasicity and response to augmentation.

Calf Veins: No evidence of thrombus. Normal compressibility and flow
on color Doppler imaging.

Superficial Great Saphenous Vein: No evidence of thrombus. Normal
compressibility.

Venous Reflux:  None.

Other Findings:  None.

LEFT LOWER EXTREMITY

Common Femoral Vein: No evidence of thrombus. Normal
compressibility, respiratory phasicity and response to augmentation.

Saphenofemoral Junction: No evidence of thrombus. Normal
compressibility and flow on color Doppler imaging.

Profunda Femoral Vein: No evidence of thrombus. Normal
compressibility and flow on color Doppler imaging.

Femoral Vein: Incomplete compressibility of the femoral vein in the
proximal thigh. There is a suggestion of eccentric wall thickening
but positive flow on color Doppler imaging.

Popliteal Vein: No evidence of thrombus. Normal compressibility,
respiratory phasicity and response to augmentation.

Calf Veins: No evidence of thrombus. Normal compressibility and flow
on color Doppler imaging.

Superficial Great Saphenous Vein: No evidence of thrombus. Normal
compressibility.

Venous Reflux:  None.

Other Findings:  None.
IMPRESSION: 1. There is a suggestion of nonocclusive wall adherent DVT in the
right common femoral vein at the saphenofemoral junction. This is a
focal abnormality and may be subacute or chronic in nature.
2. Similarly, there is a short segment of eccentric wall thickening
in the left femoral vein in the proximal thigh concerning for
nonocclusive subacute to chronic DVT.
3. No evidence of acute occlusive DVT in either lower extremity.

## 2020-07-08 DIAGNOSIS — H579 Unspecified disorder of eye and adnexa: Secondary | ICD-10-CM | POA: Diagnosis not present

## 2020-07-08 DIAGNOSIS — R519 Headache, unspecified: Secondary | ICD-10-CM | POA: Diagnosis not present

## 2020-07-08 DIAGNOSIS — R1030 Lower abdominal pain, unspecified: Secondary | ICD-10-CM | POA: Diagnosis not present

## 2020-07-08 DIAGNOSIS — R638 Other symptoms and signs concerning food and fluid intake: Secondary | ICD-10-CM | POA: Diagnosis not present

## 2020-07-09 DIAGNOSIS — N390 Urinary tract infection, site not specified: Secondary | ICD-10-CM | POA: Diagnosis not present

## 2020-07-09 DIAGNOSIS — I10 Essential (primary) hypertension: Secondary | ICD-10-CM | POA: Diagnosis not present

## 2020-07-09 DIAGNOSIS — U071 COVID-19: Secondary | ICD-10-CM | POA: Diagnosis not present

## 2020-07-09 DIAGNOSIS — J9622 Acute and chronic respiratory failure with hypercapnia: Secondary | ICD-10-CM | POA: Diagnosis not present

## 2020-07-09 DIAGNOSIS — Z5181 Encounter for therapeutic drug level monitoring: Secondary | ICD-10-CM | POA: Diagnosis not present

## 2020-07-09 DIAGNOSIS — R0989 Other specified symptoms and signs involving the circulatory and respiratory systems: Secondary | ICD-10-CM | POA: Diagnosis not present

## 2020-07-10 DIAGNOSIS — J9611 Chronic respiratory failure with hypoxia: Secondary | ICD-10-CM | POA: Diagnosis not present

## 2020-07-13 DIAGNOSIS — Z7901 Long term (current) use of anticoagulants: Secondary | ICD-10-CM | POA: Diagnosis not present

## 2020-07-13 DIAGNOSIS — N390 Urinary tract infection, site not specified: Secondary | ICD-10-CM | POA: Diagnosis not present

## 2020-07-13 DIAGNOSIS — U071 COVID-19: Secondary | ICD-10-CM | POA: Diagnosis not present

## 2020-07-13 DIAGNOSIS — R7989 Other specified abnormal findings of blood chemistry: Secondary | ICD-10-CM | POA: Diagnosis not present

## 2020-07-14 DIAGNOSIS — R093 Abnormal sputum: Secondary | ICD-10-CM | POA: Diagnosis not present

## 2020-07-14 DIAGNOSIS — R059 Cough, unspecified: Secondary | ICD-10-CM | POA: Diagnosis not present

## 2020-07-14 DIAGNOSIS — U071 COVID-19: Secondary | ICD-10-CM | POA: Diagnosis not present

## 2020-08-03 DIAGNOSIS — I5032 Chronic diastolic (congestive) heart failure: Secondary | ICD-10-CM | POA: Diagnosis not present

## 2020-08-03 DIAGNOSIS — J441 Chronic obstructive pulmonary disease with (acute) exacerbation: Secondary | ICD-10-CM | POA: Diagnosis not present

## 2020-08-03 DIAGNOSIS — Z8616 Personal history of COVID-19: Secondary | ICD-10-CM | POA: Diagnosis not present

## 2020-08-03 DIAGNOSIS — R0989 Other specified symptoms and signs involving the circulatory and respiratory systems: Secondary | ICD-10-CM | POA: Diagnosis not present

## 2020-08-05 DIAGNOSIS — J9611 Chronic respiratory failure with hypoxia: Secondary | ICD-10-CM | POA: Diagnosis not present

## 2020-08-05 DIAGNOSIS — I10 Essential (primary) hypertension: Secondary | ICD-10-CM | POA: Diagnosis not present

## 2020-08-20 ENCOUNTER — Non-Acute Institutional Stay: Payer: Medicare HMO | Admitting: Nurse Practitioner

## 2020-08-20 ENCOUNTER — Other Ambulatory Visit: Payer: Self-pay

## 2020-08-20 ENCOUNTER — Encounter: Payer: Self-pay | Admitting: Nurse Practitioner

## 2020-08-20 VITALS — BP 130/83 | HR 76 | Temp 97.3°F | Resp 18 | Wt 222.5 lb

## 2020-08-20 DIAGNOSIS — R0602 Shortness of breath: Secondary | ICD-10-CM

## 2020-08-20 DIAGNOSIS — R5381 Other malaise: Secondary | ICD-10-CM

## 2020-08-20 DIAGNOSIS — Z515 Encounter for palliative care: Secondary | ICD-10-CM | POA: Diagnosis not present

## 2020-08-20 NOTE — Progress Notes (Signed)
[facility] Consult Note Telephone: [phone]  Fax: [phone]    Date of encounter: 08/20/20 PATIENT NAME: Katherine [name] [place] [id number]   [phone] (home)  DOB: 12-01-1940 MRN: [id number] PRIMARY CARE PROVIDER:    [facility], [place] [address] [phone]  RESPONSIBLE PARTY:    Contact Information     Name Relation Home Work [place] Spouse [phone]  [phone]   Katherine [name], Katherine [name] Daughter   [phone]      I met face to face with patient in facility. Palliative Care was asked to follow this patient by consultation request of  [facility], [place] to address advance care planning and complex medical decision making. This is a follow up visit.  ASSESSMENT AND PLAN / RECOMMENDATIONS:   Symptom Management/Plan: 1. ACP: remains a full code; most form in ACP   2. Edema stable; secondary to CHF, monitor weights, elevate; discussed nutrition with Mr. Katherine [name], Katherine [name] [name], endorses she wants him to bring her sausage bisquits daily with fried foods. We talked about her heart healthy diet, what foods would be considered heart healthy. Katherine [name] endorses he will change the foods he has been bringing her to eat.    3. Shortness of breath secondary to CHF, controlled;  continue diuresing, inhalation therapy; Continuous O2,  Continue daily weights   4. Debility; discussed with Katherine [name] about getting oob which she declines. Katherine [name] asked about Katherine [name] getting out of bed, explained non-compliance, if she declines to staff when they try to get her oob.    5. Palliative care encounter; Palliative medicine team will continue to support patient, patient's family, and medical team. Visit consisted of counseling and education dealing with the complex and emotionally intense issues of symptom management and palliative care in the setting of serious and  potentially life-threatening illness  Follow up Palliative Care Visit: Palliative care will continue to follow for complex medical decision making, advance care planning, and clarification of goals. Return 8 weeks or prn.  I spent 40 minutes providing this consultation. More than 50% of the time in this consultation was spent in counseling and care coordination.  PPS: 30%  HOSPICE ELIGIBILITY/DIAGNOSIS: TBD  Chief Complaint: Follow up Palliative consult for complex medical decision making  HISTORY OF PRESENT ILLNESS:  Katherine [name] is a 80 y.o. 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 Katherine [name] resides at [place] [facility]. Katherine [name] [name] remains bed bound, require staff to assist her with turning, positioning, bathing, dressing. Katherine [name] [name] does feed herself after tray setup. Katherine [name] has been feeding herself per staff. Appetite has been fair. Her [name] does bring Katherine [name] in food. Katherine [name] has had no recent falls, wounds, hospitalizations. Staff endorses no new changes or concerns. 07/27/2020 care plan meeting was held, no changes at this time. I visited and observed Katherine [name]. Katherine [name] did make eye contact, answer questions. Katherine [name] endorses she was not having pain, shortness of breath. Katherine [name] does appear comfortable. No visitors present. Katherine [name].  [name] had her tray in front of her. Asked if she was hungry which she replied no. Asked if  she would get oob, she said no. Katherine [name] was cooperative with assessment. Limited discussion with cognitive impairment. Emotional support provided. I called Mr. Katherine [name], Katherine [name]. Katherine [name]. Clinical update discussed, we talked about palliative visit with Katherine [name]. Katherine [name]. Katherine [name] asked about Katherine [name]. [name] getting out of bed, explained non-compliance, if she declines to staff when they try to get her oob. Encouraged Katherine [name] to continue to encourage Katherine [name]. [name] to get OOB. We talked about symptoms currently pain free, no dyspnea. Remains on continous O2. Katherine [name] endorses she wants him to bring her sausage bisquits daily with fried foods. We talked about her heart healthy diet, what foods would be considered heart healthy. Nutritional education done. Katherine [name] endorses he will change the foods he has been bringing her to eat. Medical goals reviewed. We talked about f/u PC visit in 8 weeks, Katherine [name] in agreement, updated nursing staff. No new changes to goals or poc.  History obtained from review of EMR, discussion with facility staff, [name], and Katherine [name]. Katherine [name].  I reviewed available labs, medications, imaging, studies and related documents from the EMR.  Records reviewed and summarized above.   ROS Full 14 system review of systems performed and negative with exception of: as per HPI.   Physical Exam: Constitutional: NAD General: obese, chronically ill, debilitated female EYES: lids intact ENMT: oral mucous membranes moist CV: S1S2, RRR, Pulmonary: Clear, decrease bases, no increased work of breathing, O2 Abdomen: soft and non tender MSK: functional quadriplegic;  Skin: warm and dry, no rashes or wounds on visible skin Neuro:  + generalized weakness,  +cognitive impairment Psych: flat affect, A and O x 2  Questions and concerns were addressed. The patient/family was encouraged to call with questions and/or concerns. My contact information was provided. Provided general support and encouragement, no  other unmet needs identified   Thank you for the opportunity to participate in the care of Katherine [name]. [place].  The palliative care team will continue to follow. Please call our office at [phone] if we can be of additional assistance.   This chart was dictated using voice recognition software.  Despite best efforts to proofread,  errors can occur which can change the documentation meaning.   Roseann Kees Ihor Gully, NP

## 2020-09-01 IMAGING — US US EXTREM LOW VENOUS BILAT
1 series · 13 of 24 positions shown · non-contrast
Comparison: None.

CLINICAL DATA: Bilateral leg pain for 1 day.



[Series 1: us extrem low venous bilat · 0.08mm/px · 13 of 62 slices shown]
[im 1/62]
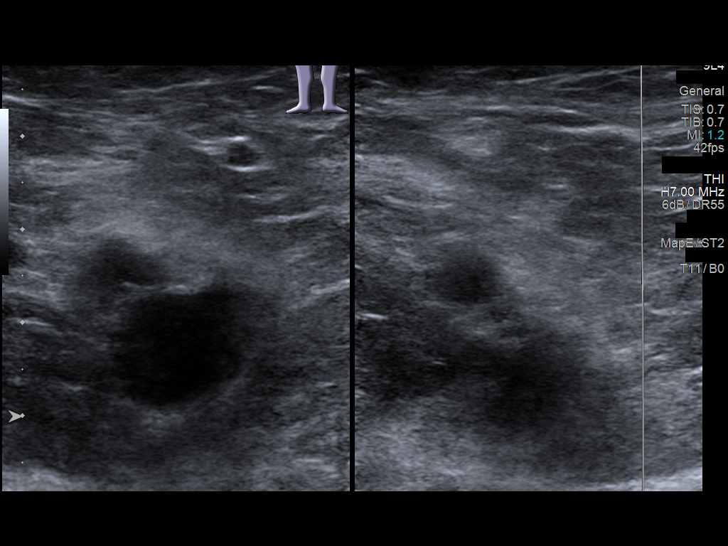
[im 6/62]
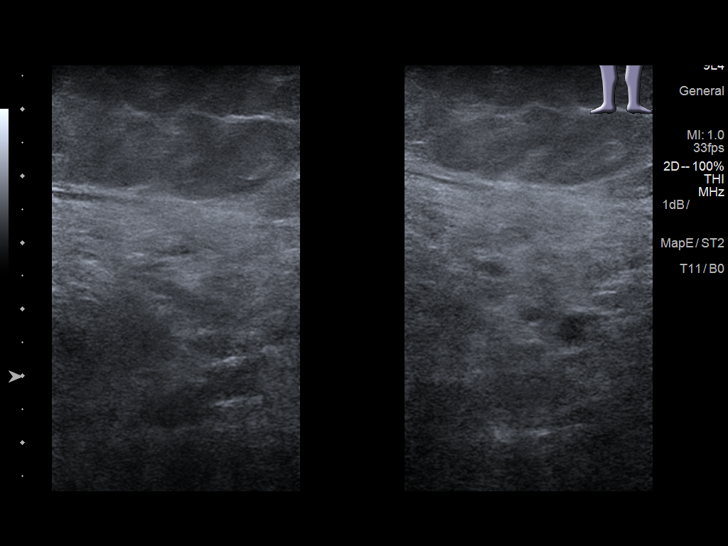
[im 11/62]
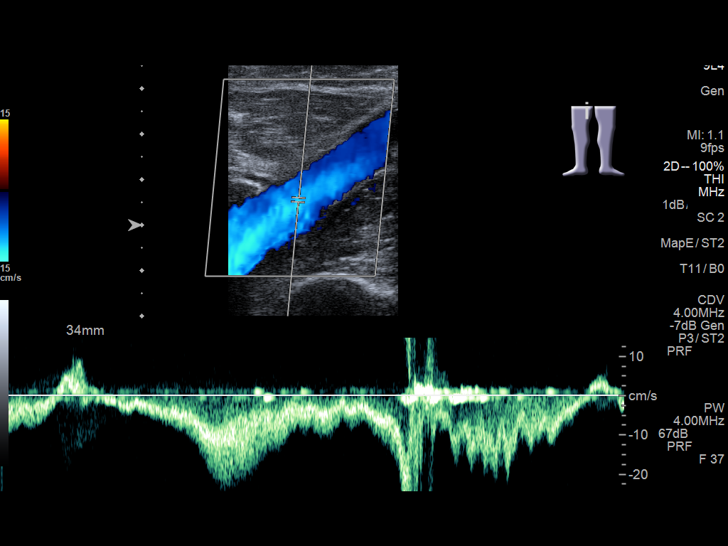
[im 16/62]
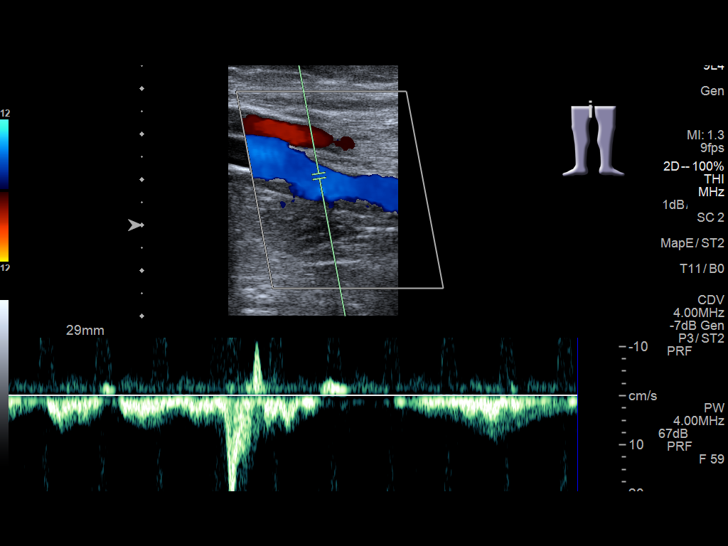
[im 22/62]
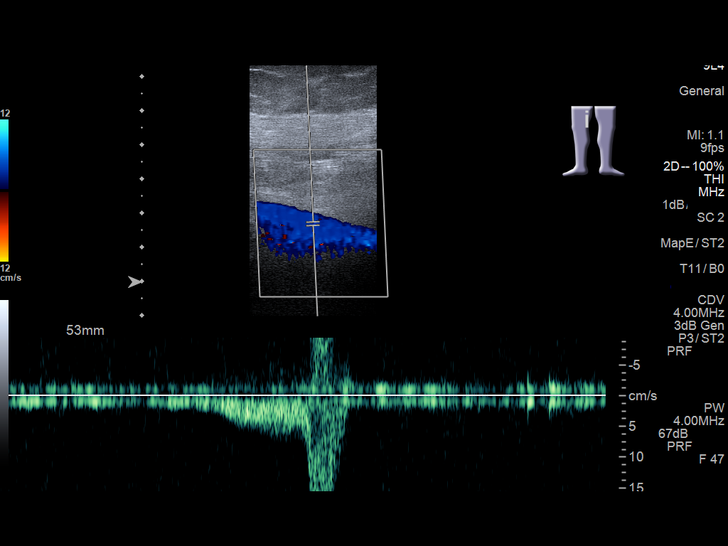
[im 27/62]
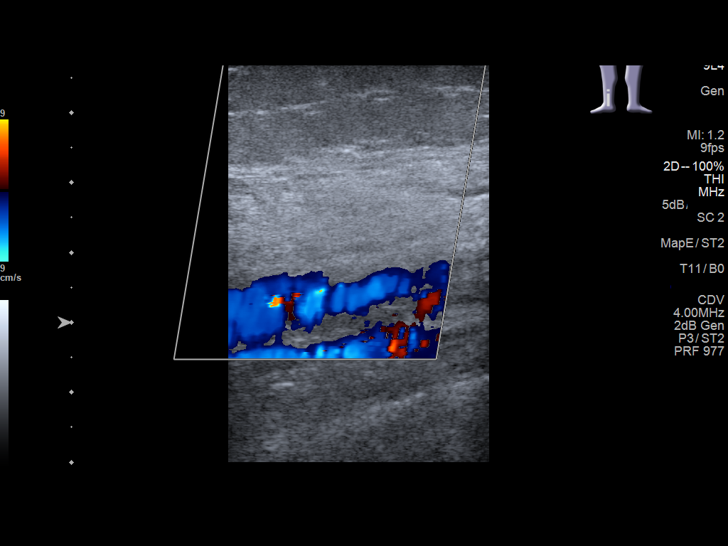
[im 32/62]
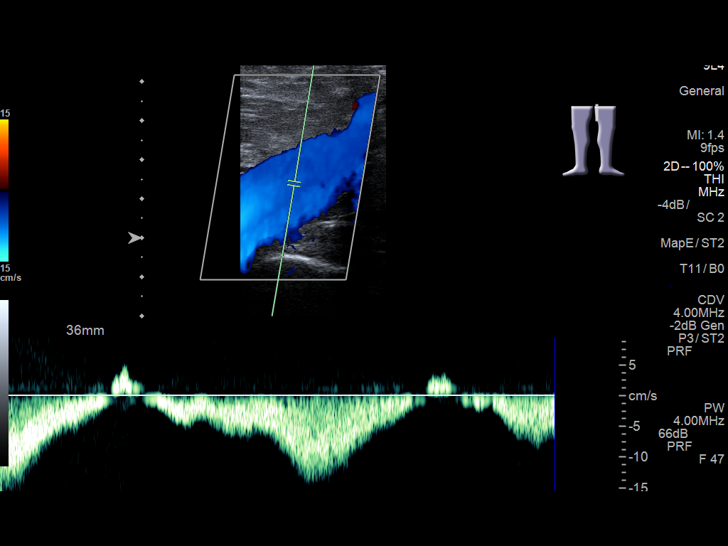
[im 35/62]
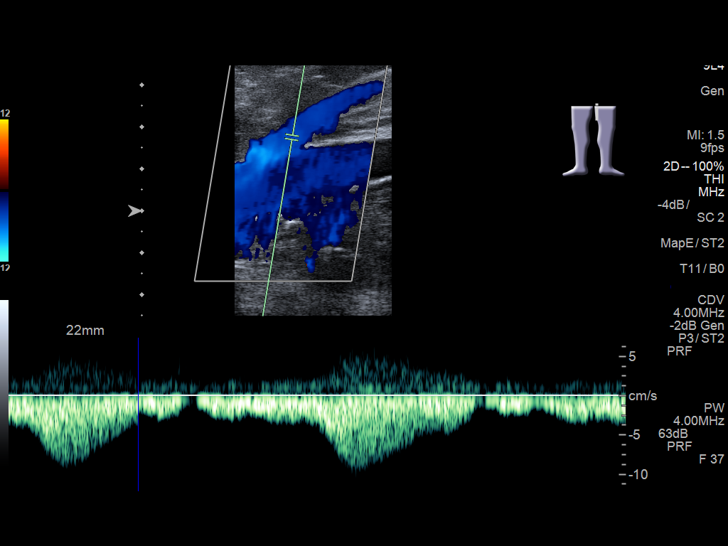
[im 40/62]
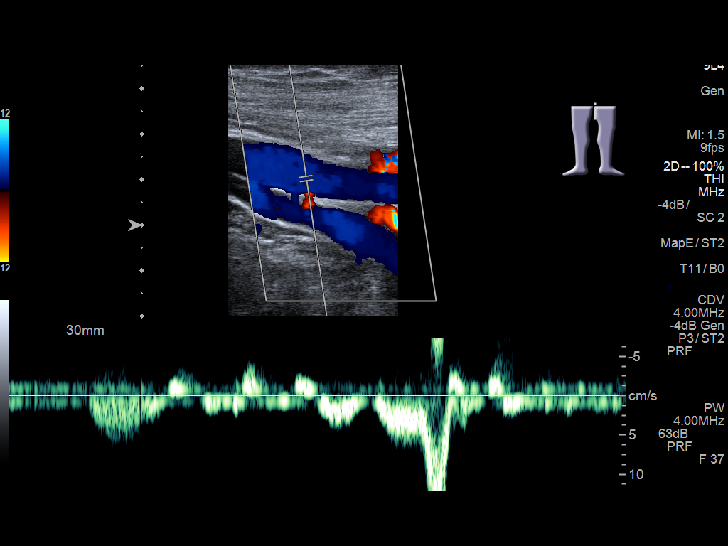
[im 46/62]
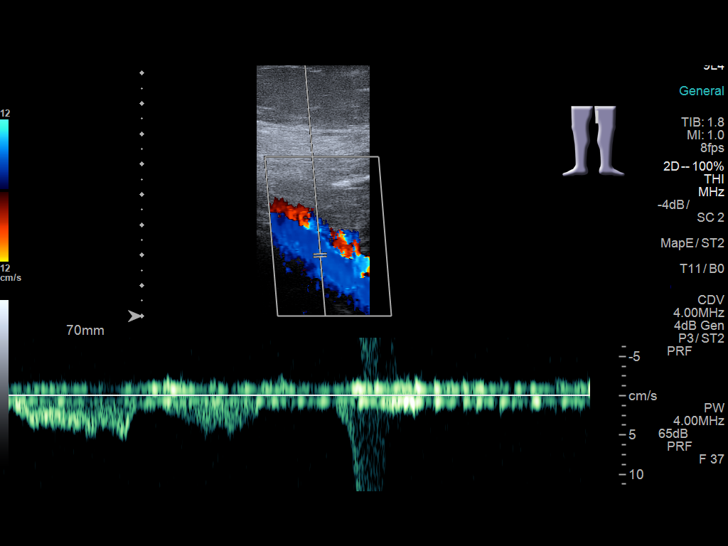
[im 51/62]
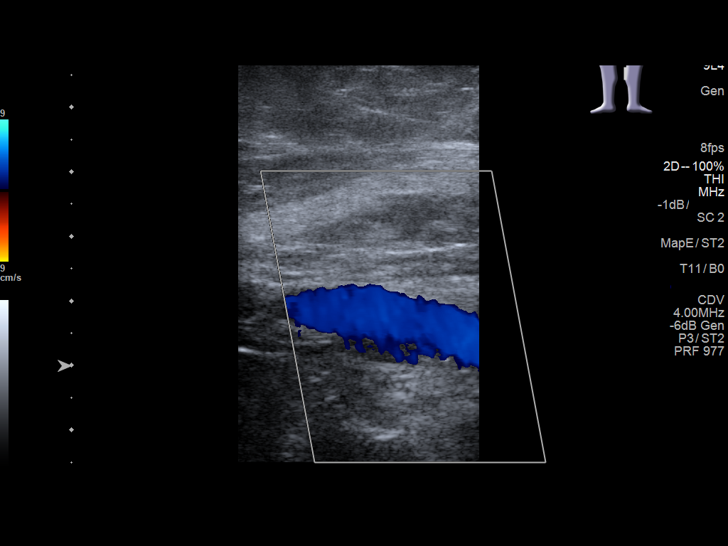
[im 56/62]
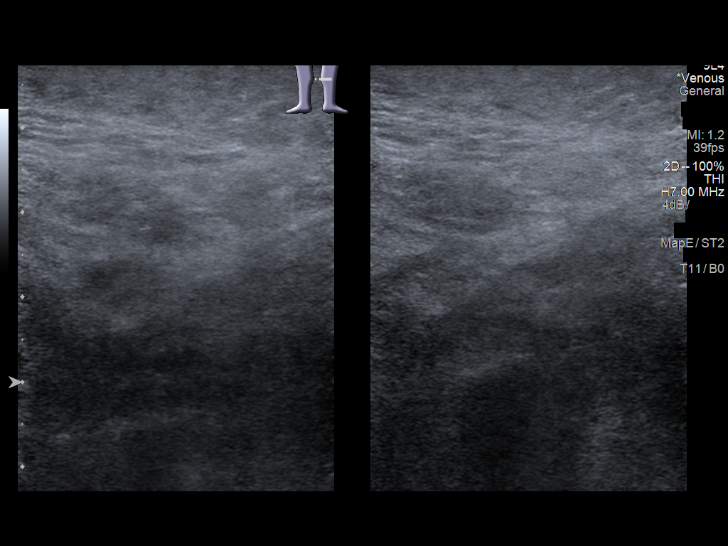
[im 62/62]
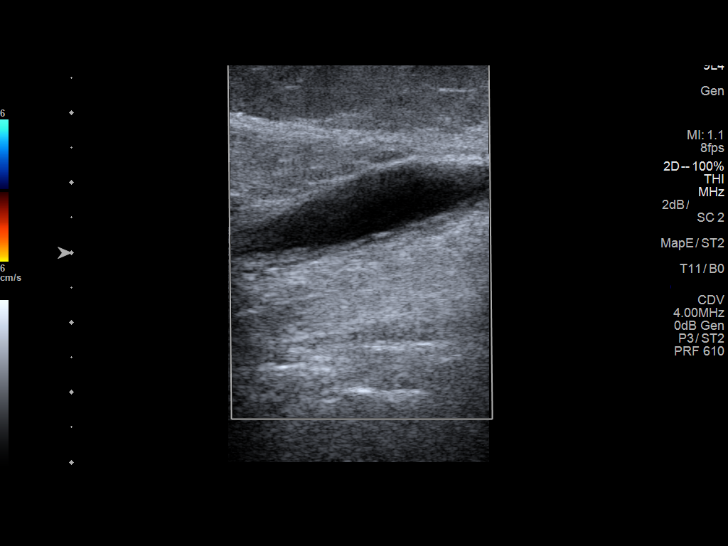

[13 of 24 positions shown; findings below may reference images not displayed]

FINDINGS: RIGHT LOWER EXTREMITY

Common Femoral Vein: No evidence of thrombus. Normal
compressibility, respiratory phasicity and response to augmentation.

Saphenofemoral Junction: No evidence of thrombus. Normal
compressibility and flow on color Doppler imaging.

Profunda Femoral Vein: No evidence of thrombus. Normal
compressibility and flow on color Doppler imaging.

Femoral Vein: No evidence of thrombus. Normal compressibility,
respiratory phasicity and response to augmentation.

Popliteal Vein: No evidence of thrombus. Normal compressibility,
respiratory phasicity and response to augmentation.

Calf Veins: No evidence of thrombus. Normal compressibility and flow
on color Doppler imaging.

Superficial Great Saphenous Vein: No evidence of thrombus. Normal
compressibility.

Venous Reflux:  None.

Other Findings:  None.

LEFT LOWER EXTREMITY

Common Femoral Vein: No evidence of thrombus. Normal
compressibility, respiratory phasicity and response to augmentation.

Saphenofemoral Junction: No evidence of thrombus. Normal
compressibility and flow on color Doppler imaging.

Profunda Femoral Vein: No evidence of thrombus. Normal
compressibility and flow on color Doppler imaging.

Femoral Vein: No evidence of thrombus. Normal compressibility,
respiratory phasicity and response to augmentation.

Popliteal Vein: No evidence of thrombus. Normal compressibility,
respiratory phasicity and response to augmentation.

Calf Veins: No evidence of thrombus. Normal compressibility and flow
on color Doppler imaging.

Superficial Great Saphenous Vein: No evidence of thrombus. Normal
compressibility.

Venous Reflux:  None.

Other Findings: Complex cysts in the popliteal fossa measuring 3.9 x
1.1 x 3.3 cm.
IMPRESSION: 1. No evidence of deep venous thrombosis.
2. Popliteal cyst on the left.

## 2020-09-02 IMAGING — US US RENAL
1 series · 14 of 25 positions shown · non-contrast
Comparison: CT, 05/04/2017

CLINICAL DATA: Acute kidney injury.

EXAM:
RENAL / URINARY TRACT ULTRASOUND COMPLETE

[Series 1: us renal · 0.21mm/px · 14 of 41 slices shown]
[im 1/41]
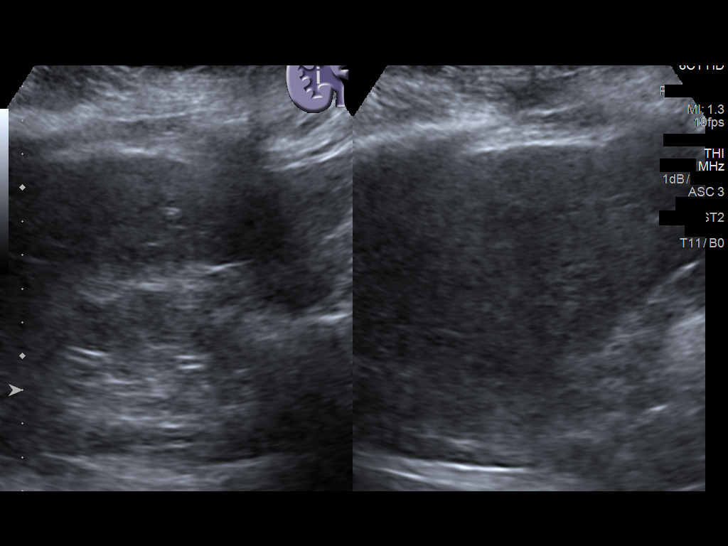
[im 4/41]
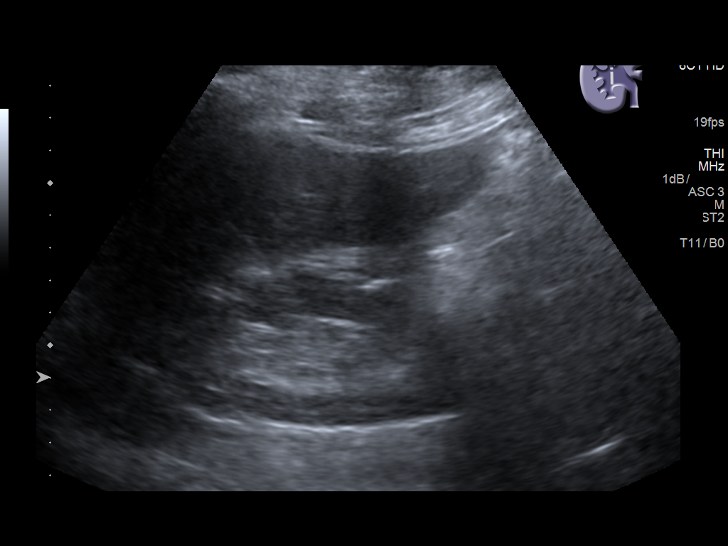
[im 7/41]
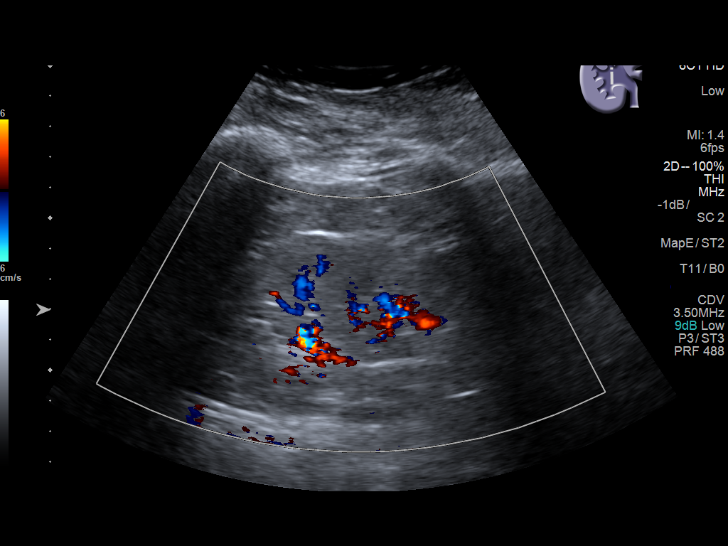
[im 11/41]
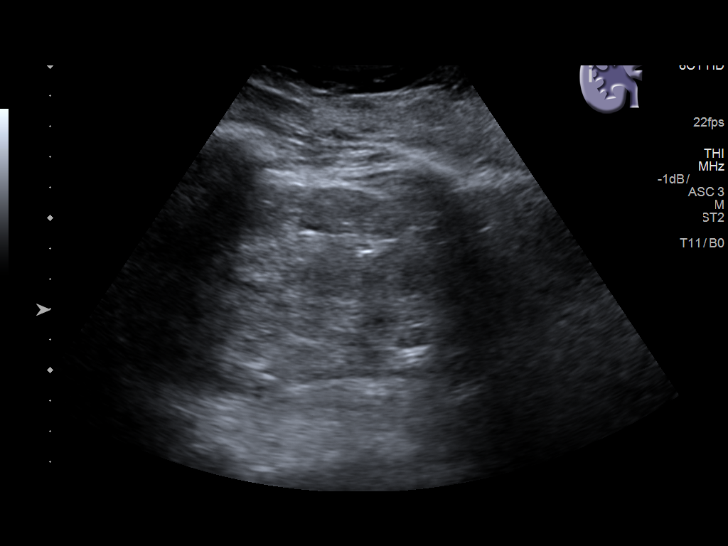
[im 14/41]
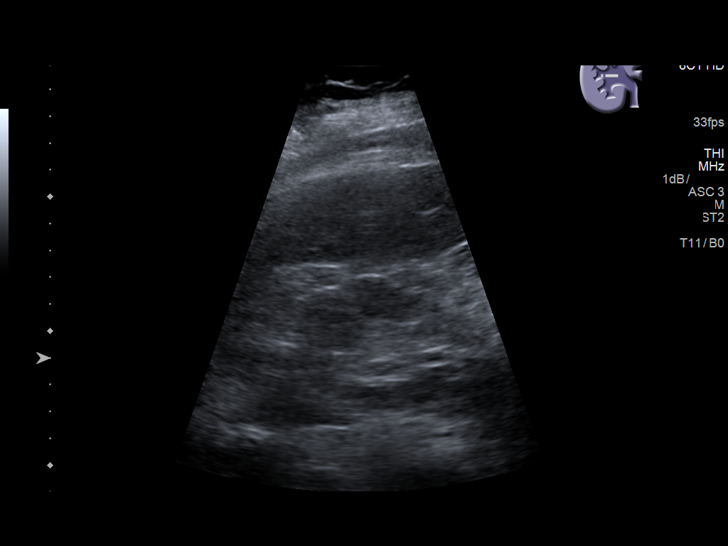
[im 16/41]
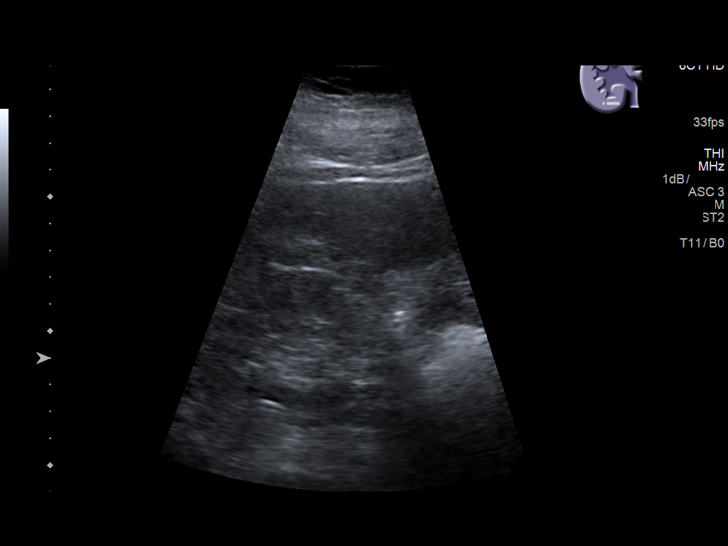
[im 19/41]
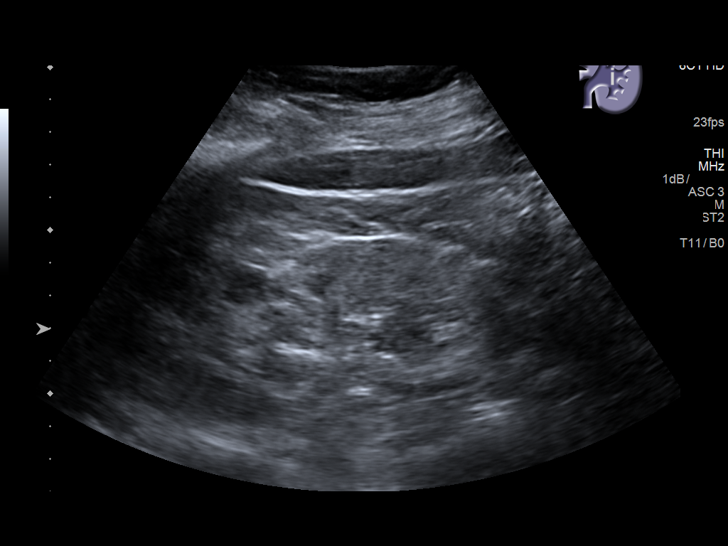
[im 22/41]
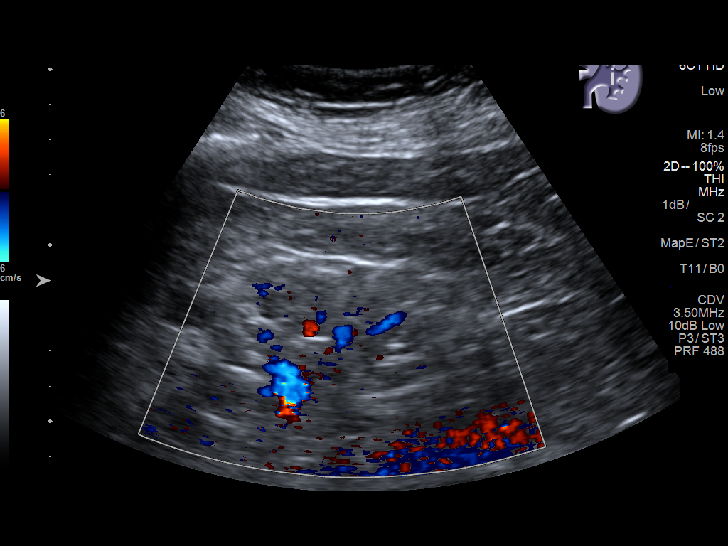
[im 26/41]
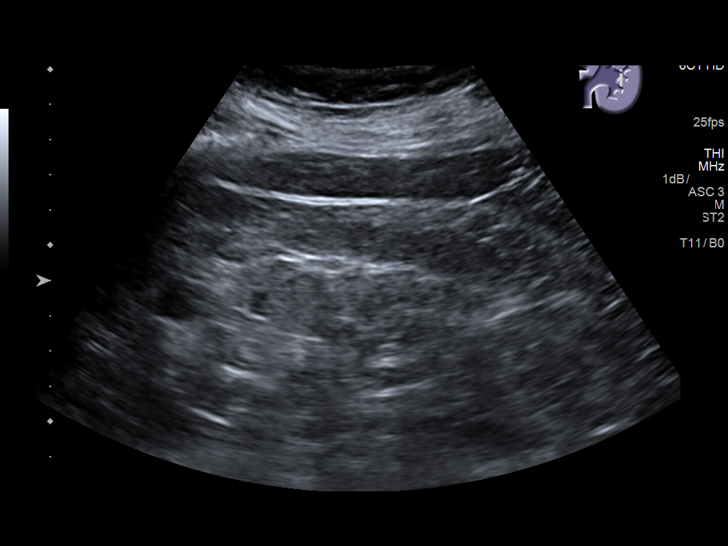
[im 27/41]
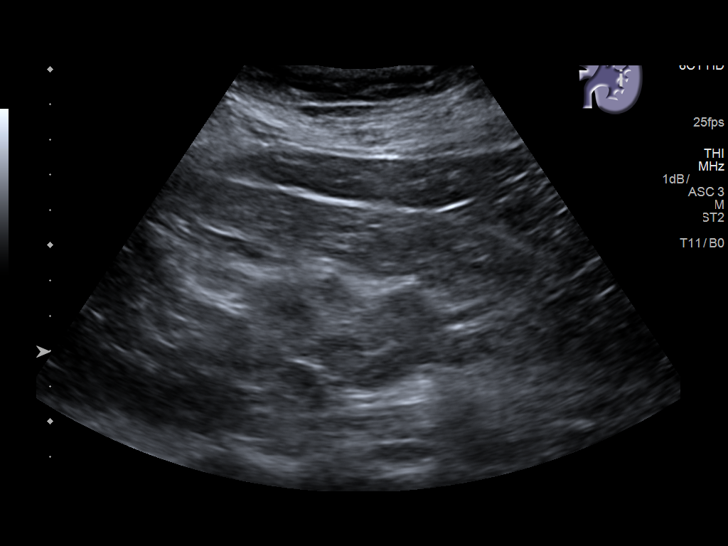
[im 31/41]
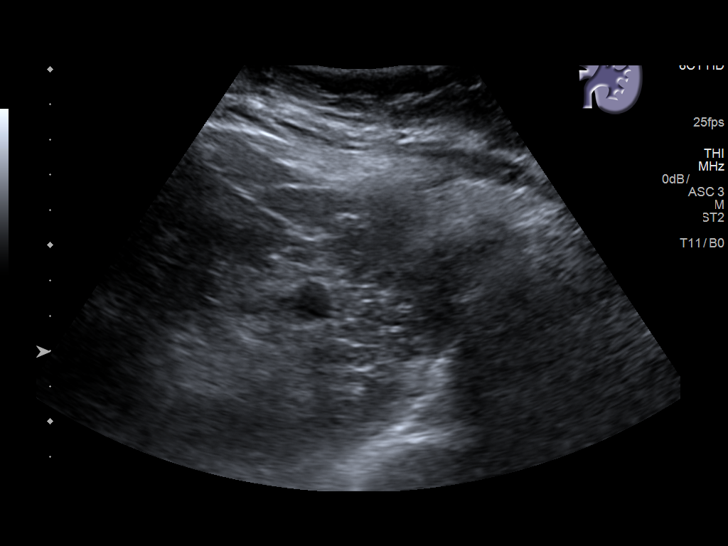
[im 34/41]
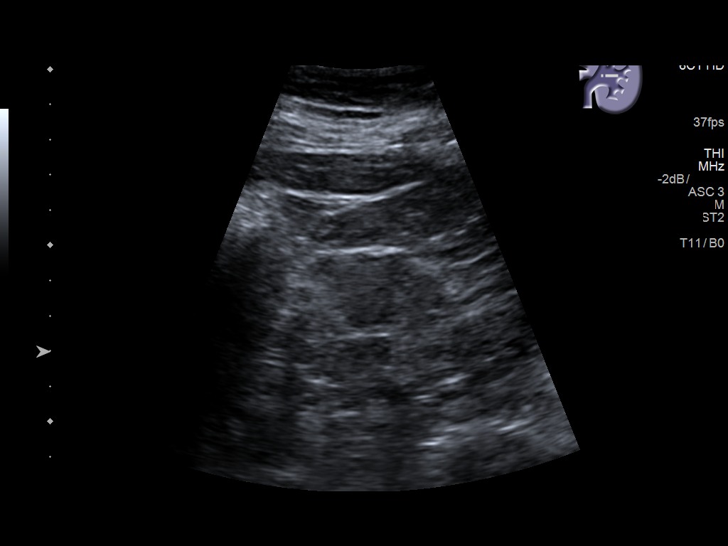
[im 37/41]
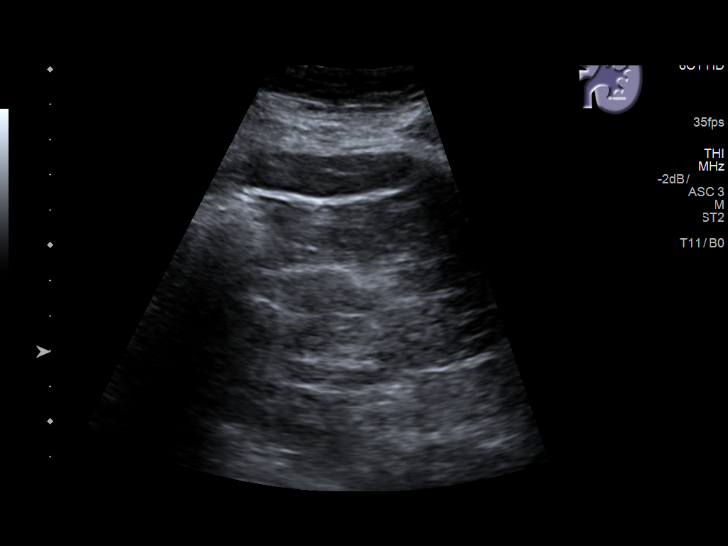
[im 41/41]
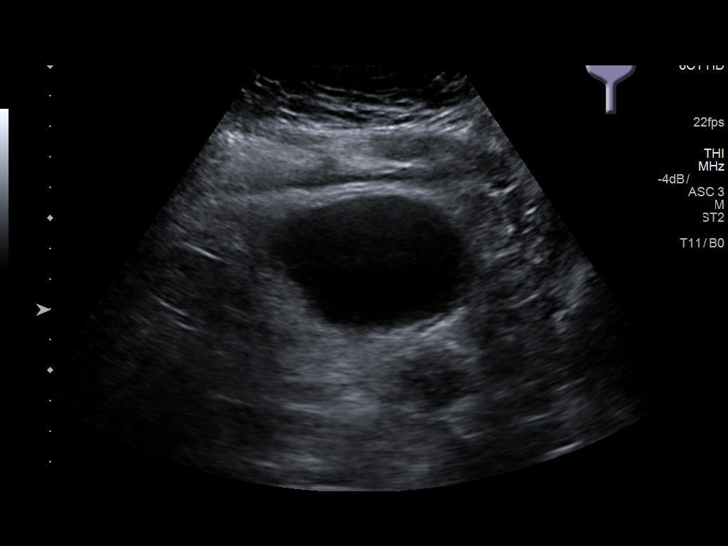

[14 of 25 positions shown; findings below may reference images not displayed]

FINDINGS: Right Kidney:

Length: 9.5 cm. Mildly increased renal parenchymal echogenicity.
Mild diffuse renal cortical thinning. No mass or stone. No
hydronephrosis.

Left Kidney:

Length: 8.6 cm. Increased renal parenchymal echogenicity. Diffuse
renal cortical thinning greater than that on the right. Cyst
protrudes from the upper pole measuring 14 x 11 x 12 mm. No other
renal masses. No stones. No hydronephrosis.

Bladder:

Appears normal for degree of bladder distention.
IMPRESSION: 1. No acute findings.  No hydronephrosis.
2. Increased renal parenchymal echogenicity and renal cortical
thinning, left greater than right, consistent with medical renal
disease.
3. 14 mm left renal cyst.

## 2020-09-05 IMAGING — DX DG CHEST 1V PORT
1 series · 1 of 1 positions shown · non-contrast
Comparison: 12/26/2017

CLINICAL DATA: Chest pain. Increasing oxygen requirement. Left lung
pneumonia 6 days ago.

EXAM:
PORTABLE CHEST 1 VIEW

[chest ap]
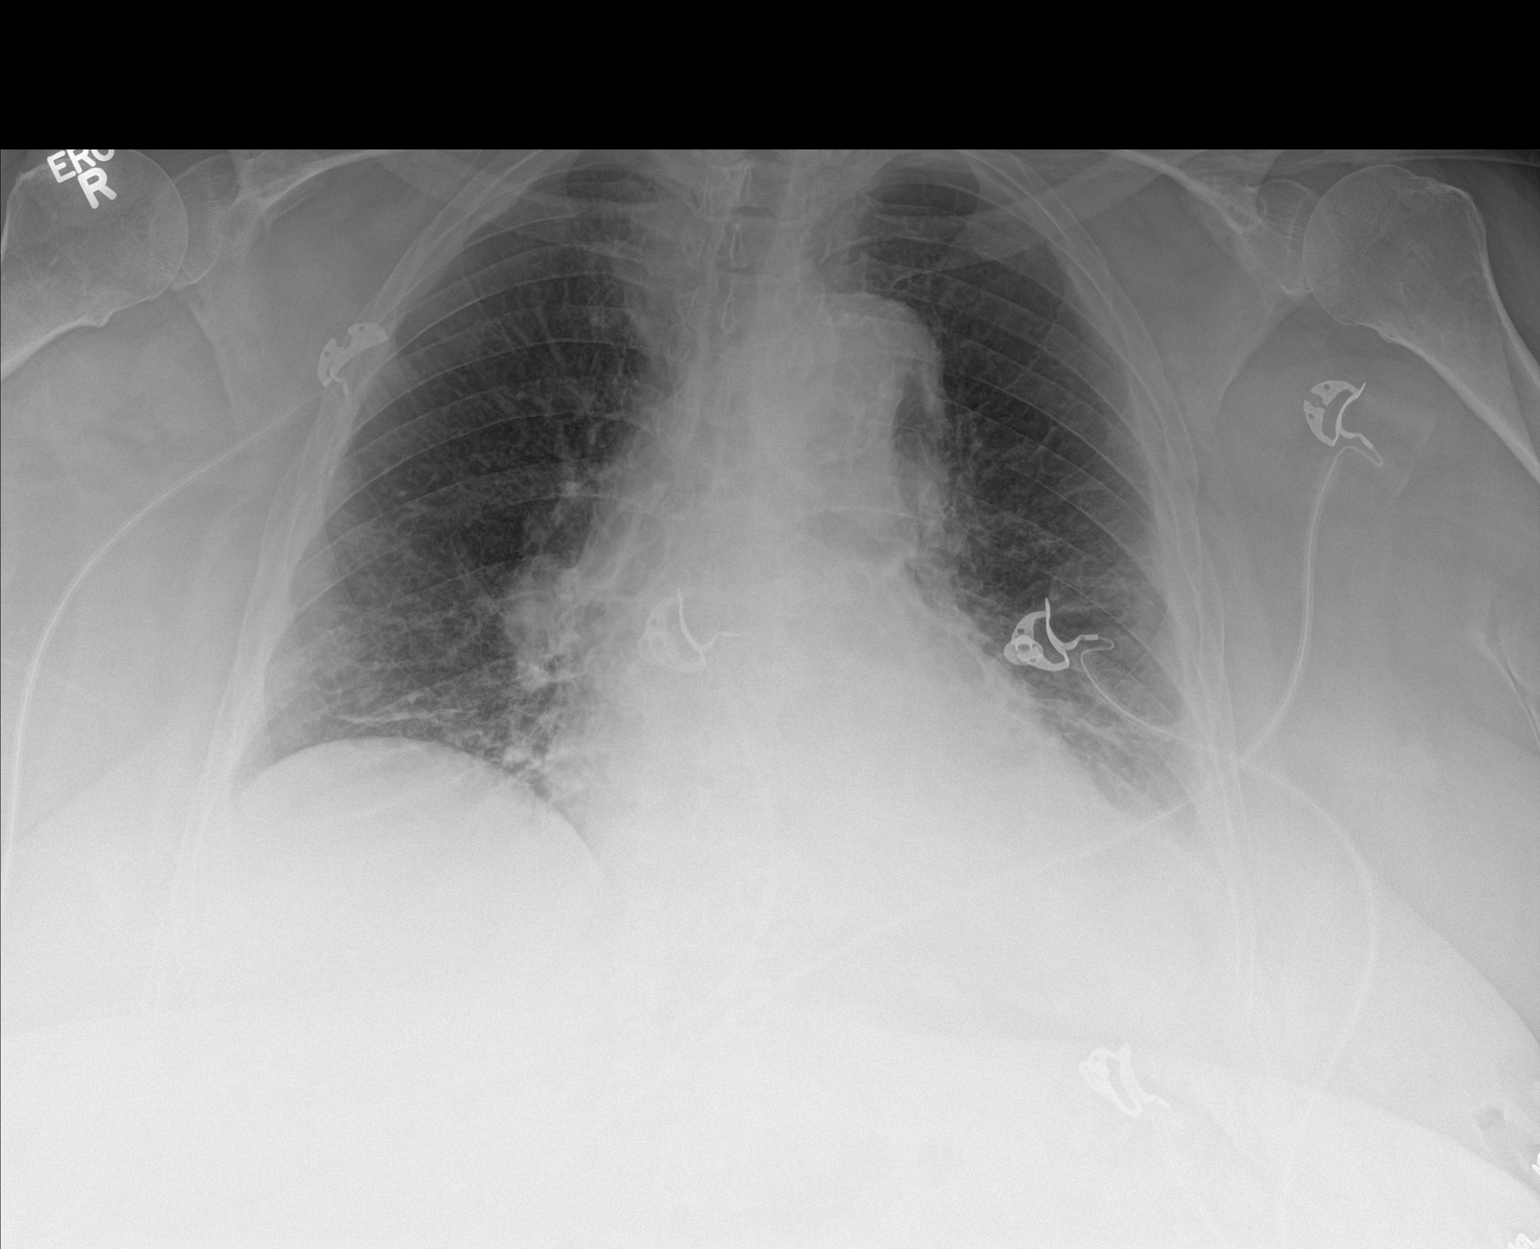

[1 of 1 positions shown; findings below may reference images not displayed]

FINDINGS: Shallow inspiration with linear atelectasis in the lung bases.
Cardiac enlargement. No vascular congestion, edema, or
consolidation. No blunting of costophrenic angles. No pneumothorax.
Mediastinal contours appear intact. Calcification of the aorta.
IMPRESSION: Shallow inspiration with linear atelectasis in the lung bases.
Cardiac enlargement.

## 2020-09-09 IMAGING — CT CT HEAD W/O CM
3 series · 14 of 46 positions shown, 16 images · non-contrast
Comparison: Head CT scan 11/10/2017 and 07/25/2008. Brain MRI
10/05/2017.

CLINICAL DATA: Episodic vertigo.

EXAM:
CT HEAD WITHOUT CONTRAST
TECHNIQUE: Contiguous axial images were obtained from the base of the skull
through the vertex without intravenous contrast.

[Series 3: head wo · axial · 0.39mm/px · z∈[+276,+396]mm · 8 of 29 slices shown, 10 images]
[im 3/29  brain]
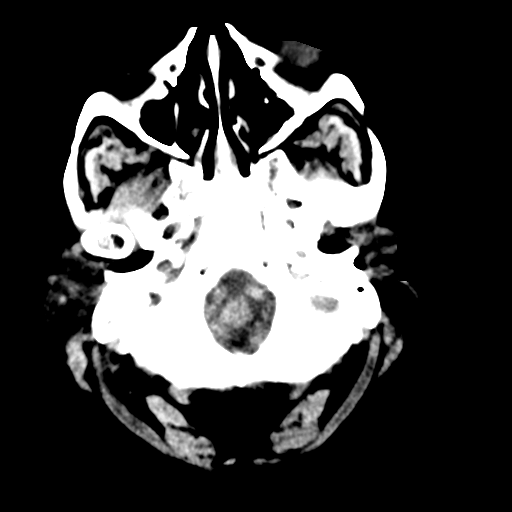
[im 3/29  bone]
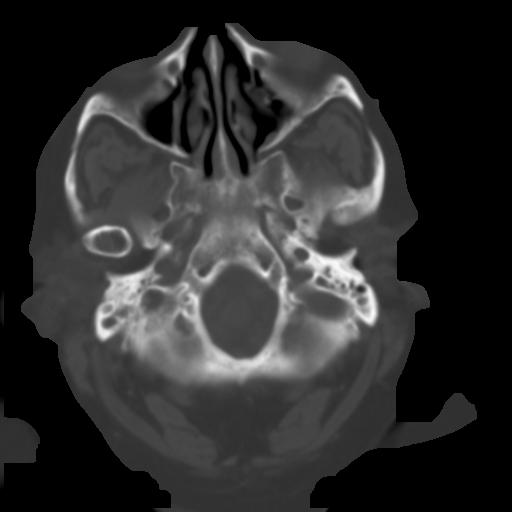
[im 7/29  brain]
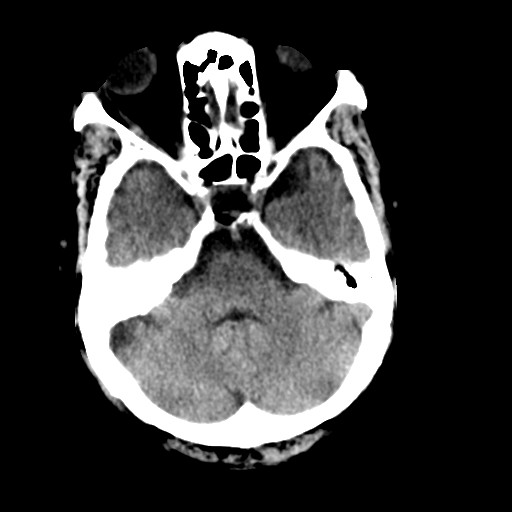
[im 10/29  brain]
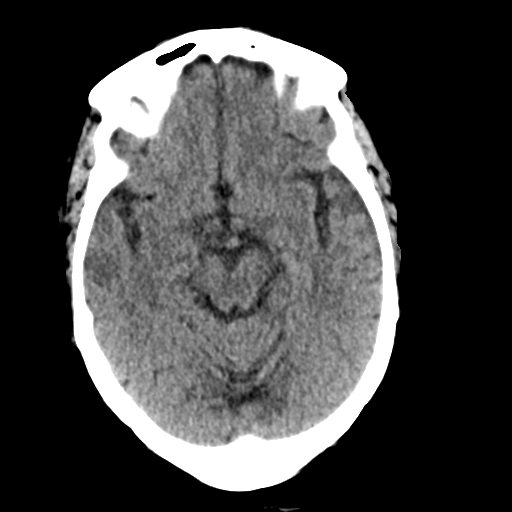
[im 13/29  brain]
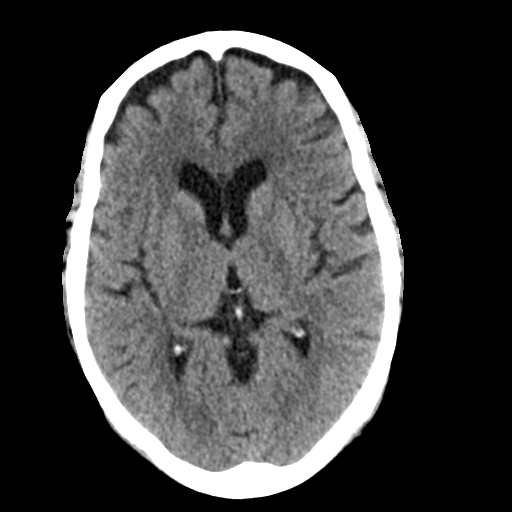
[im 17/29  brain]
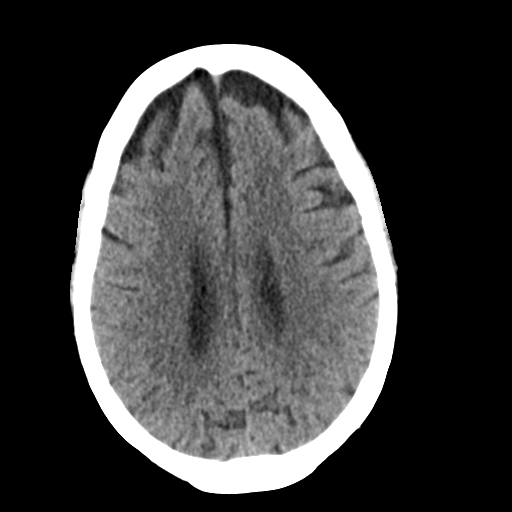
[im 17/29  bone]
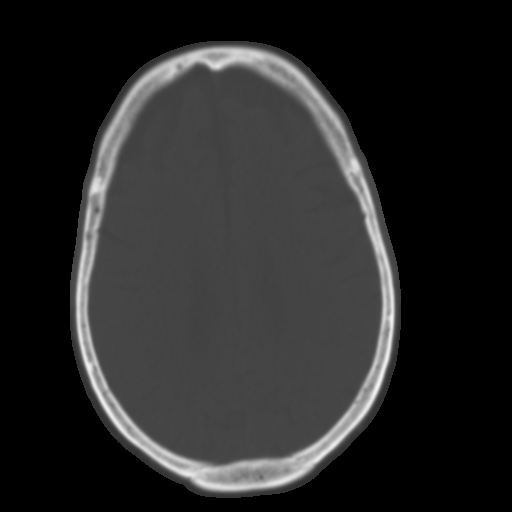
[im 20/29  brain]
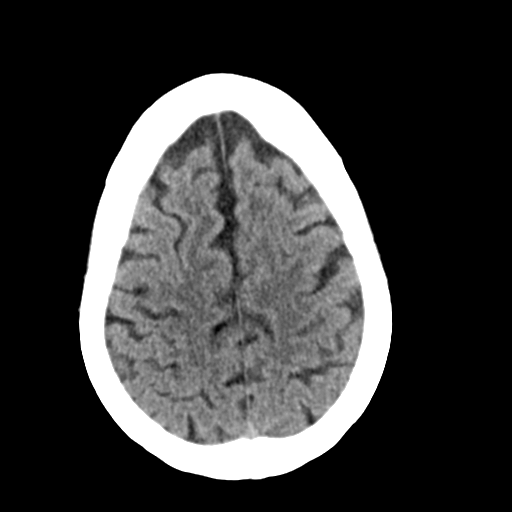
[im 23/29  brain]
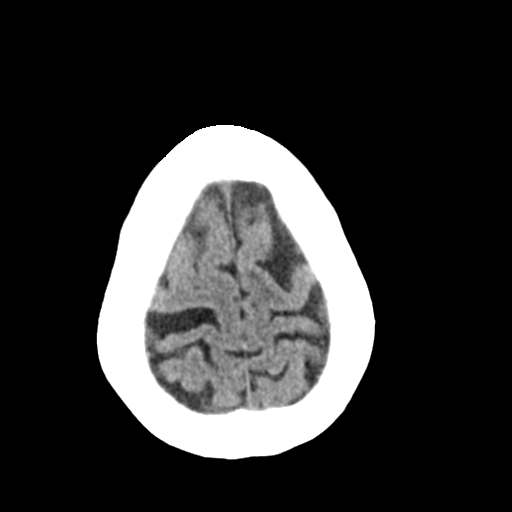
[im 27/29  brain]
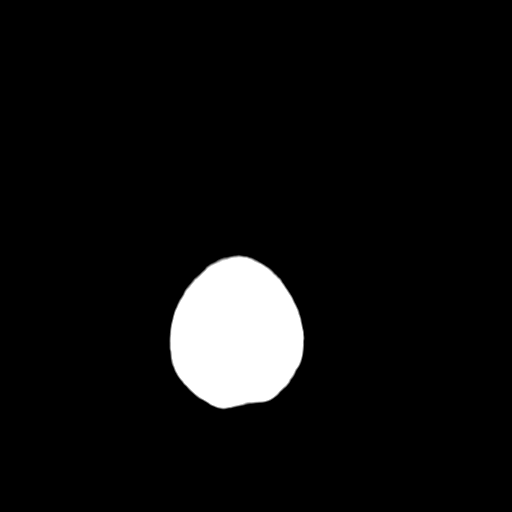

[Series 4: coronal soft tissue · coronal · 0.28mm/px · 3 of 68 slices shown]
[im 23/68  brain]
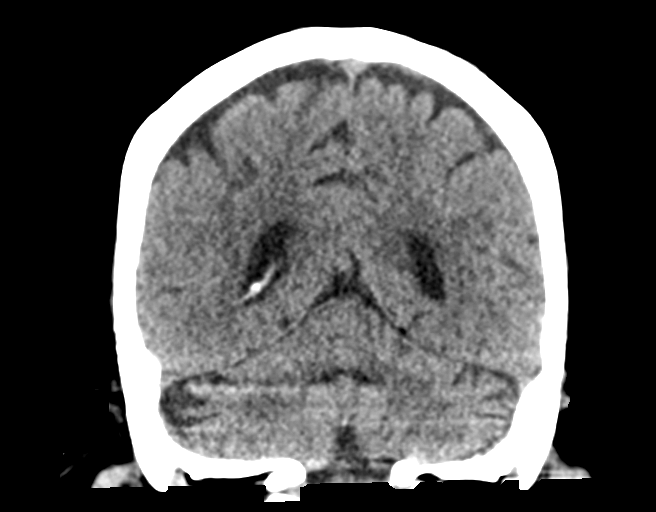
[im 30/68  brain]
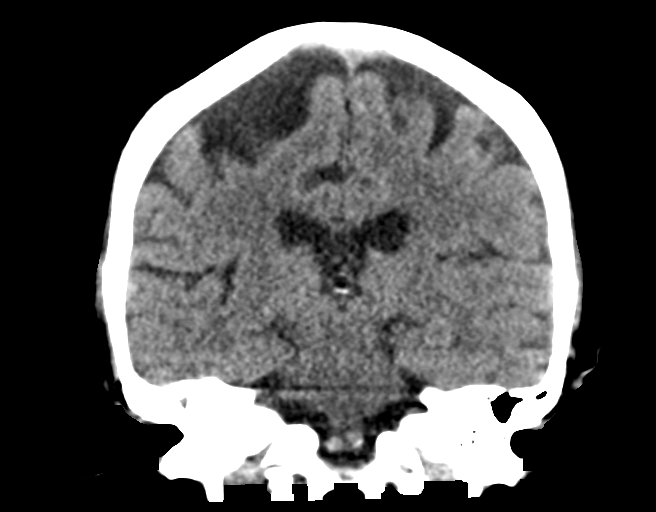
[im 38/68  brain]
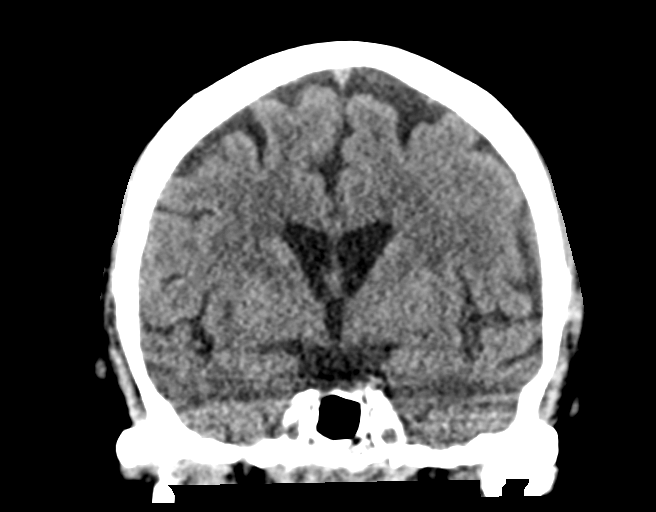

[Series 5: sagittal soft tissue · sagittal · 0.28mm/px · 3 of 61 slices shown]
[im 21/61  brain]
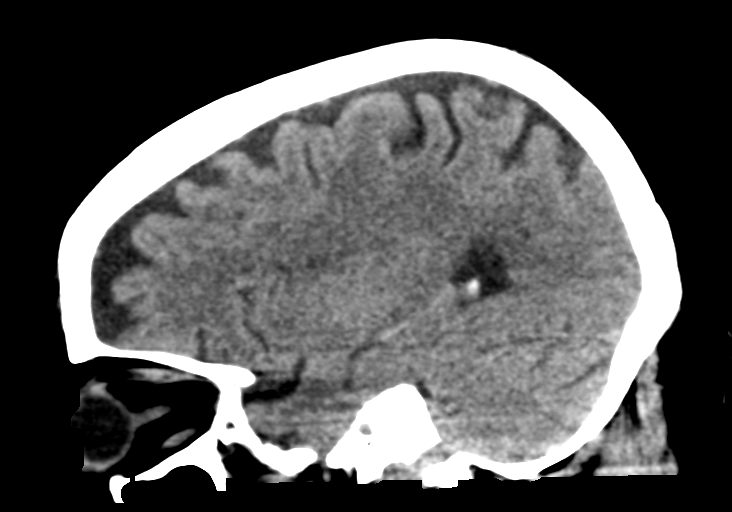
[im 31/61  brain]
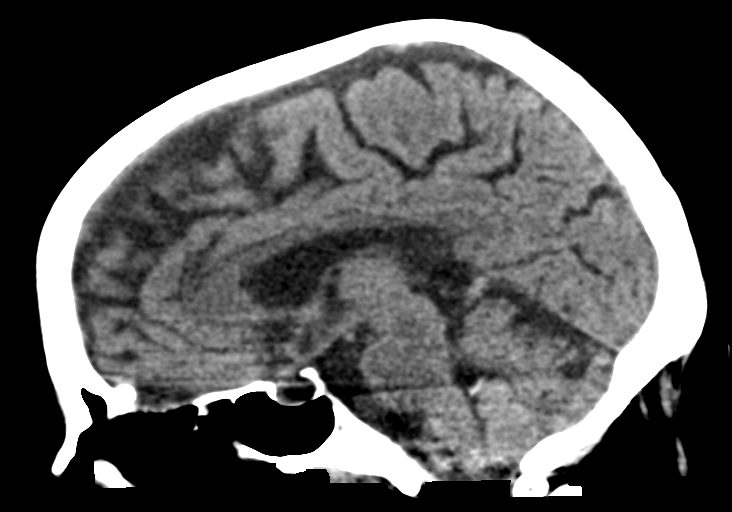
[im 41/61  brain]
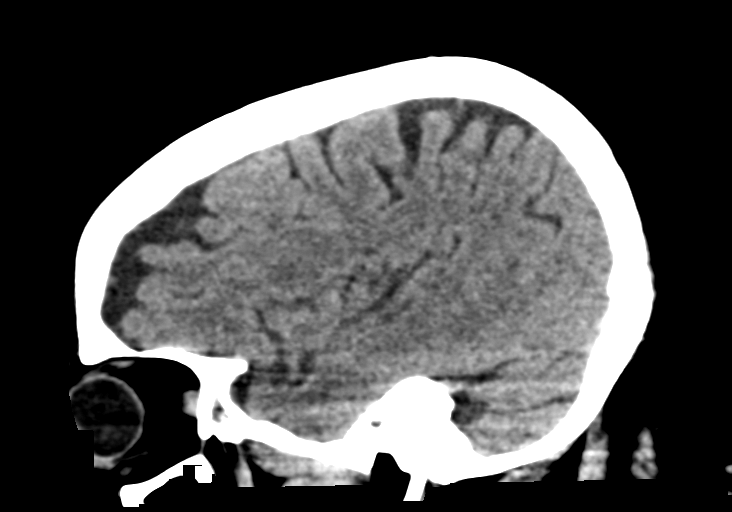

[14 of 46 positions shown; findings below may reference images not displayed]

FINDINGS: Brain: No evidence of acute infarction, hemorrhage, hydrocephalus,
extra-axial collection or mass lesion/mass effect. Cortical atrophy
and chronic microvascular ischemic change are stable in appearance
compared to the most recent exam.

Vascular: No hyperdense vessel or unexpected calcification.

Skull: Intact.  No focal lesion.

Sinuses/Orbits: Right mastoid effusion is unchanged. Smaller left
mastoid effusion is improved. Otherwise negative.

Other: None.
IMPRESSION: No acute abnormality.

Atrophy and chronic microvascular ischemic change.

No change in a right mastoid effusion. Left mastoid effusion is
improved compared to the most recent study.

## 2020-10-04 ENCOUNTER — Encounter: Payer: Self-pay | Admitting: Nurse Practitioner

## 2020-10-04 ENCOUNTER — Other Ambulatory Visit: Payer: Self-pay

## 2020-10-04 ENCOUNTER — Non-Acute Institutional Stay: Payer: Medicare HMO | Admitting: Nurse Practitioner

## 2020-10-04 VITALS — BP 130/83 | HR 78 | Resp 18 | Wt 222.5 lb

## 2020-10-04 DIAGNOSIS — I509 Heart failure, unspecified: Secondary | ICD-10-CM

## 2020-10-04 DIAGNOSIS — R5381 Other malaise: Secondary | ICD-10-CM

## 2020-10-04 DIAGNOSIS — R0602 Shortness of breath: Secondary | ICD-10-CM

## 2020-10-04 DIAGNOSIS — Z515 Encounter for palliative care: Secondary | ICD-10-CM

## 2020-10-04 NOTE — Progress Notes (Signed)
[facility] Palliative Care Consult Note Telephone: [phone]  Fax: [phone]    Date of encounter: 10/04/20 9:13 PM PATIENT NAME: Katherine [name] [id number]   [phone] (home)  DOB: [birth year] MRN: [id number] PRIMARY CARE PROVIDER:    Care, [place] [address] [phone]  RESPONSIBLE PARTY:    Contact Information     Name Relation Home Work Katherine [name] Spouse [phone]  [phone]   Nicie, [place] Daughter   [phone]      I met face to face with patient in facility. Palliative Care was asked to follow this patient by consultation request of  Care, [place] to address advance care planning and complex medical decision making. This is a follow up visit.                                  ASSESSMENT AND PLAN / RECOMMENDATIONS:  Symptom Management/Plan: 1. ACP: remains a full code; most form in ACP   2. Edema stable; secondary to CHF, monitor weights, elevate; discussed nutrition, healthy food choices though limited with cognitive impairment mild   3. Shortness of breath secondary to CHF, controlled;  continue diuresing, inhalation therapy; Continuous O2,  Continue daily weights    4. Debility; revisited with Katherine [name] about getting oob which she declines. Will continue to encourage to get oob   5. Palliative care encounter; Palliative medicine team will continue to support patient, patient's family, and medical team. Visit consisted of counseling and education dealing with the complex and emotionally intense issues of symptom management and palliative care in the setting of serious and potentially life-threatening illness  Follow up Palliative Care Visit: Palliative care will continue to follow for complex medical decision making, advance care planning, and clarification of goals. Return 8 weeks or prn.  I spent 40 minutes providing this  consultation. More than 50% of the time in this consultation was spent in counseling and care coordination. PPS: 30%  Chief Complaint: Follow up palliative consult for complex medial decision making  HISTORY OF PRESENT ILLNESS:  Katherine [name] is a 80 y.o. 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 Katherine [name] resides at [place] at [facility]. Katherine [name] remains bed bound, require staff to assist her with turning, positioning, bathing, dressing. Katherine [name] does feed herself after tray setup. Katherine [name] has been feeding herself per staff. Appetite has been fair. Staff endorses no recent falls, wounds, hospitalizations. Staff endorses no other changes. I visited and observed Katherine [name]. Katherine [name] was lying in bed, getting ready to eat lunch, feeding herself. Katherine [name] and I talked about purpose of pc visit. Katherine [name] limited with cognitive impairment mild. Katherine [name] complained about the food she was getting ready to eat. Katherine [name] endorses it was different than on the menu. We talked about nutrition. We talked about symptoms of pain, sob which she denies. We talked at length about mobility, getting oob. Katherine [name]  endorses she does not want to get oob. Encouraged, Katherine [name] to consider and she would be more mobile. We talked about quality of life, residing facility with limitations being bed bound. We talked about being on  continuous O2. We talked about role pc in poc. I attempted to contact Katherine [name] while in the room so Katherine [name] could also talk with him. No answer, message left. Medical goals reviewed. Therapeutic listening, emotional support provided. Questions answered. I updated staff.   History obtained from review of EMR, discussion with staff and Katherine [name].  I reviewed available labs, medications, imaging, studies and related documents from the EMR.  Records reviewed and summarized above.   ROS Full 14 system review of systems performed and negative with exception of: as per HPI.   Physical Exam: Constitutional: NAD General: frail appearing, obese debilitated, chronically ill, o2 dependent female EYES: lids intact ENMT: oral mucous membranes moist CV: S1S2, RRR, no LE edema Pulmonary: decrease basis, no cough Abdomen: normo-active BS + 4 quadrants, soft and non tender MSK: bed bound Skin: warm and dry Neuro:  + generalized weakness,  +mild cognitive impairment Psych: non-anxious affect, A and O x 2  Questions and concerns were addressed. Provided general support and encouragement, no other unmet needs identified   Thank you for the opportunity to participate in the care of Katherine [name].  The palliative care team will continue to follow. Please call our office at [phone] if we can be of additional assistance.   This chart was dictated using voice recognition software.  Despite best efforts to proofread,  errors can occur which can change the documentation meaning.   Noriel Guthrie Ihor Gully, NP

## 2020-10-05 DIAGNOSIS — R262 Difficulty in walking, not elsewhere classified: Secondary | ICD-10-CM | POA: Diagnosis not present

## 2020-10-05 DIAGNOSIS — J9622 Acute and chronic respiratory failure with hypercapnia: Secondary | ICD-10-CM | POA: Diagnosis not present

## 2020-10-06 DIAGNOSIS — J9622 Acute and chronic respiratory failure with hypercapnia: Secondary | ICD-10-CM | POA: Diagnosis not present

## 2020-10-06 DIAGNOSIS — R262 Difficulty in walking, not elsewhere classified: Secondary | ICD-10-CM | POA: Diagnosis not present

## 2020-10-07 DIAGNOSIS — R262 Difficulty in walking, not elsewhere classified: Secondary | ICD-10-CM | POA: Diagnosis not present

## 2020-10-07 DIAGNOSIS — J9622 Acute and chronic respiratory failure with hypercapnia: Secondary | ICD-10-CM | POA: Diagnosis not present

## 2020-10-08 DIAGNOSIS — R262 Difficulty in walking, not elsewhere classified: Secondary | ICD-10-CM | POA: Diagnosis not present

## 2020-10-08 DIAGNOSIS — J9622 Acute and chronic respiratory failure with hypercapnia: Secondary | ICD-10-CM | POA: Diagnosis not present

## 2020-10-11 DIAGNOSIS — R262 Difficulty in walking, not elsewhere classified: Secondary | ICD-10-CM | POA: Diagnosis not present

## 2020-10-11 DIAGNOSIS — J9622 Acute and chronic respiratory failure with hypercapnia: Secondary | ICD-10-CM | POA: Diagnosis not present

## 2020-10-12 DIAGNOSIS — J9622 Acute and chronic respiratory failure with hypercapnia: Secondary | ICD-10-CM | POA: Diagnosis not present

## 2020-10-12 DIAGNOSIS — R262 Difficulty in walking, not elsewhere classified: Secondary | ICD-10-CM | POA: Diagnosis not present

## 2020-10-13 DIAGNOSIS — R262 Difficulty in walking, not elsewhere classified: Secondary | ICD-10-CM | POA: Diagnosis not present

## 2020-10-13 DIAGNOSIS — J9622 Acute and chronic respiratory failure with hypercapnia: Secondary | ICD-10-CM | POA: Diagnosis not present

## 2020-10-14 DIAGNOSIS — R262 Difficulty in walking, not elsewhere classified: Secondary | ICD-10-CM | POA: Diagnosis not present

## 2020-10-14 DIAGNOSIS — J9622 Acute and chronic respiratory failure with hypercapnia: Secondary | ICD-10-CM | POA: Diagnosis not present

## 2020-10-15 DIAGNOSIS — R262 Difficulty in walking, not elsewhere classified: Secondary | ICD-10-CM | POA: Diagnosis not present

## 2020-10-15 DIAGNOSIS — J9622 Acute and chronic respiratory failure with hypercapnia: Secondary | ICD-10-CM | POA: Diagnosis not present

## 2020-10-18 DIAGNOSIS — R262 Difficulty in walking, not elsewhere classified: Secondary | ICD-10-CM | POA: Diagnosis not present

## 2020-10-18 DIAGNOSIS — J9622 Acute and chronic respiratory failure with hypercapnia: Secondary | ICD-10-CM | POA: Diagnosis not present

## 2020-10-20 DIAGNOSIS — R262 Difficulty in walking, not elsewhere classified: Secondary | ICD-10-CM | POA: Diagnosis not present

## 2020-10-20 DIAGNOSIS — J9622 Acute and chronic respiratory failure with hypercapnia: Secondary | ICD-10-CM | POA: Diagnosis not present

## 2020-10-21 DIAGNOSIS — J9622 Acute and chronic respiratory failure with hypercapnia: Secondary | ICD-10-CM | POA: Diagnosis not present

## 2020-10-21 DIAGNOSIS — R262 Difficulty in walking, not elsewhere classified: Secondary | ICD-10-CM | POA: Diagnosis not present

## 2020-10-22 DIAGNOSIS — R262 Difficulty in walking, not elsewhere classified: Secondary | ICD-10-CM | POA: Diagnosis not present

## 2020-10-22 DIAGNOSIS — E1169 Type 2 diabetes mellitus with other specified complication: Secondary | ICD-10-CM | POA: Diagnosis not present

## 2020-10-22 DIAGNOSIS — R251 Tremor, unspecified: Secondary | ICD-10-CM | POA: Diagnosis not present

## 2020-10-22 DIAGNOSIS — J449 Chronic obstructive pulmonary disease, unspecified: Secondary | ICD-10-CM | POA: Diagnosis not present

## 2020-10-22 DIAGNOSIS — J9622 Acute and chronic respiratory failure with hypercapnia: Secondary | ICD-10-CM | POA: Diagnosis not present

## 2020-10-22 DIAGNOSIS — G40909 Epilepsy, unspecified, not intractable, without status epilepticus: Secondary | ICD-10-CM | POA: Diagnosis not present

## 2020-10-23 DIAGNOSIS — R262 Difficulty in walking, not elsewhere classified: Secondary | ICD-10-CM | POA: Diagnosis not present

## 2020-10-23 DIAGNOSIS — J9622 Acute and chronic respiratory failure with hypercapnia: Secondary | ICD-10-CM | POA: Diagnosis not present

## 2020-10-23 DIAGNOSIS — Z79899 Other long term (current) drug therapy: Secondary | ICD-10-CM | POA: Diagnosis not present

## 2020-10-23 IMAGING — DX DG CHEST 1V PORT
1 series · 1 of 1 positions shown · non-contrast
Comparison: 02/24/2018 and prior radiographs

CLINICAL DATA: Cough and shortness of breath.

EXAM:
PORTABLE CHEST 1 VIEW

[chest ap]
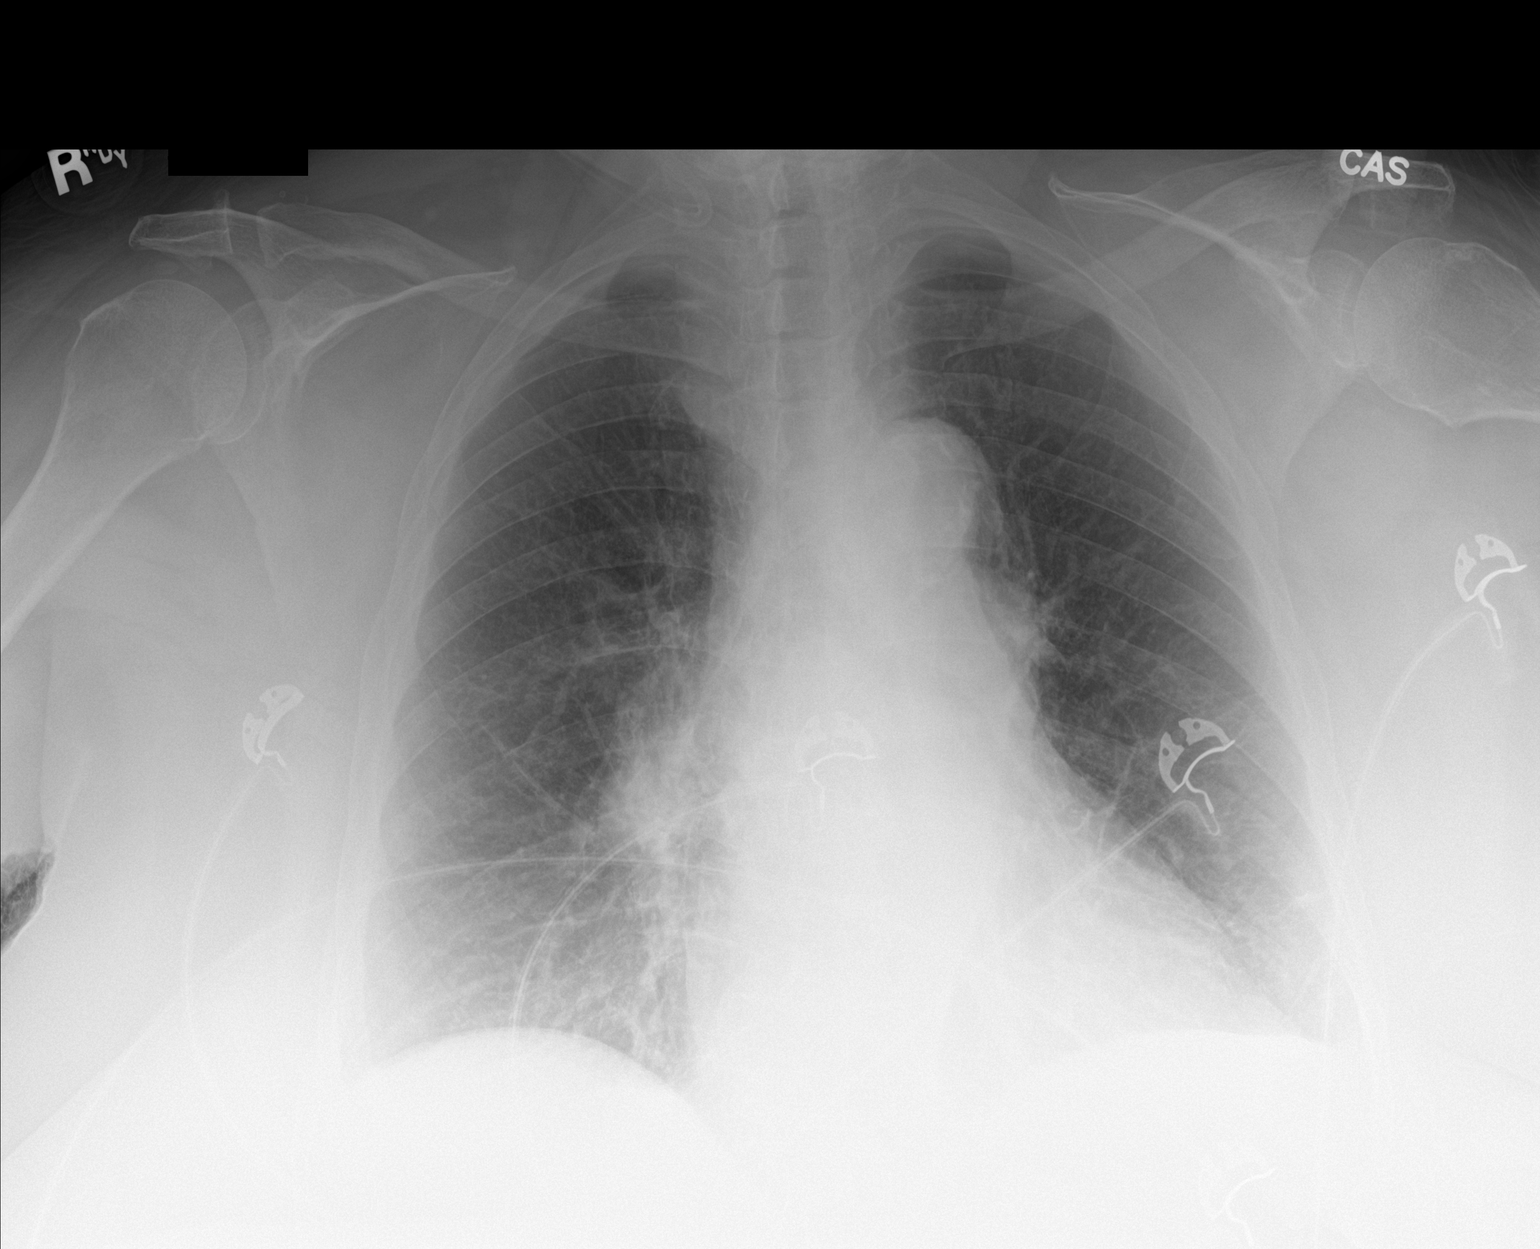

[1 of 1 positions shown; findings below may reference images not displayed]

FINDINGS: Cardiomegaly again noted.

Mild chronic peribronchial thickening is unchanged.

There is no evidence of focal airspace disease, pulmonary edema,
suspicious pulmonary nodule/mass, pleural effusion, or pneumothorax.

No acute bony abnormalities are identified.
IMPRESSION: Cardiomegaly without evidence of acute cardiopulmonary disease.

## 2020-10-25 DIAGNOSIS — R251 Tremor, unspecified: Secondary | ICD-10-CM | POA: Diagnosis not present

## 2020-10-25 DIAGNOSIS — N184 Chronic kidney disease, stage 4 (severe): Secondary | ICD-10-CM | POA: Diagnosis not present

## 2020-10-25 DIAGNOSIS — G40909 Epilepsy, unspecified, not intractable, without status epilepticus: Secondary | ICD-10-CM | POA: Diagnosis not present

## 2020-10-25 DIAGNOSIS — E7220 Disorder of urea cycle metabolism, unspecified: Secondary | ICD-10-CM | POA: Diagnosis not present

## 2020-10-26 DIAGNOSIS — J9622 Acute and chronic respiratory failure with hypercapnia: Secondary | ICD-10-CM | POA: Diagnosis not present

## 2020-10-26 DIAGNOSIS — R262 Difficulty in walking, not elsewhere classified: Secondary | ICD-10-CM | POA: Diagnosis not present

## 2020-10-27 DIAGNOSIS — J9622 Acute and chronic respiratory failure with hypercapnia: Secondary | ICD-10-CM | POA: Diagnosis not present

## 2020-10-27 DIAGNOSIS — R262 Difficulty in walking, not elsewhere classified: Secondary | ICD-10-CM | POA: Diagnosis not present

## 2020-10-29 DIAGNOSIS — Z1159 Encounter for screening for other viral diseases: Secondary | ICD-10-CM | POA: Diagnosis not present

## 2020-10-29 DIAGNOSIS — D638 Anemia in other chronic diseases classified elsewhere: Secondary | ICD-10-CM | POA: Diagnosis not present

## 2020-10-29 DIAGNOSIS — J441 Chronic obstructive pulmonary disease with (acute) exacerbation: Secondary | ICD-10-CM | POA: Diagnosis not present

## 2020-10-29 DIAGNOSIS — N1831 Chronic kidney disease, stage 3a: Secondary | ICD-10-CM | POA: Diagnosis not present

## 2020-11-01 DIAGNOSIS — D649 Anemia, unspecified: Secondary | ICD-10-CM | POA: Diagnosis not present

## 2020-11-01 DIAGNOSIS — E860 Dehydration: Secondary | ICD-10-CM | POA: Diagnosis not present

## 2020-11-01 DIAGNOSIS — R262 Difficulty in walking, not elsewhere classified: Secondary | ICD-10-CM | POA: Diagnosis not present

## 2020-11-01 DIAGNOSIS — E870 Hyperosmolality and hypernatremia: Secondary | ICD-10-CM | POA: Diagnosis not present

## 2020-11-01 DIAGNOSIS — J9622 Acute and chronic respiratory failure with hypercapnia: Secondary | ICD-10-CM | POA: Diagnosis not present

## 2020-11-01 DIAGNOSIS — N184 Chronic kidney disease, stage 4 (severe): Secondary | ICD-10-CM | POA: Diagnosis not present

## 2020-11-02 DIAGNOSIS — J9622 Acute and chronic respiratory failure with hypercapnia: Secondary | ICD-10-CM | POA: Diagnosis not present

## 2020-11-02 DIAGNOSIS — R262 Difficulty in walking, not elsewhere classified: Secondary | ICD-10-CM | POA: Diagnosis not present

## 2020-11-03 DIAGNOSIS — J9622 Acute and chronic respiratory failure with hypercapnia: Secondary | ICD-10-CM | POA: Diagnosis not present

## 2020-11-03 DIAGNOSIS — R262 Difficulty in walking, not elsewhere classified: Secondary | ICD-10-CM | POA: Diagnosis not present

## 2020-11-04 DIAGNOSIS — R262 Difficulty in walking, not elsewhere classified: Secondary | ICD-10-CM | POA: Diagnosis not present

## 2020-11-04 DIAGNOSIS — J9622 Acute and chronic respiratory failure with hypercapnia: Secondary | ICD-10-CM | POA: Diagnosis not present

## 2020-11-05 DIAGNOSIS — R262 Difficulty in walking, not elsewhere classified: Secondary | ICD-10-CM | POA: Diagnosis not present

## 2020-11-05 DIAGNOSIS — J9622 Acute and chronic respiratory failure with hypercapnia: Secondary | ICD-10-CM | POA: Diagnosis not present

## 2020-11-08 DIAGNOSIS — J9622 Acute and chronic respiratory failure with hypercapnia: Secondary | ICD-10-CM | POA: Diagnosis not present

## 2020-11-08 DIAGNOSIS — R262 Difficulty in walking, not elsewhere classified: Secondary | ICD-10-CM | POA: Diagnosis not present

## 2020-11-10 DIAGNOSIS — R262 Difficulty in walking, not elsewhere classified: Secondary | ICD-10-CM | POA: Diagnosis not present

## 2020-11-10 DIAGNOSIS — J9622 Acute and chronic respiratory failure with hypercapnia: Secondary | ICD-10-CM | POA: Diagnosis not present

## 2020-11-11 DIAGNOSIS — R262 Difficulty in walking, not elsewhere classified: Secondary | ICD-10-CM | POA: Diagnosis not present

## 2020-11-11 DIAGNOSIS — J9622 Acute and chronic respiratory failure with hypercapnia: Secondary | ICD-10-CM | POA: Diagnosis not present

## 2020-11-13 DIAGNOSIS — Z741 Need for assistance with personal care: Secondary | ICD-10-CM | POA: Diagnosis not present

## 2020-11-13 DIAGNOSIS — J9622 Acute and chronic respiratory failure with hypercapnia: Secondary | ICD-10-CM | POA: Diagnosis not present

## 2020-11-13 DIAGNOSIS — R262 Difficulty in walking, not elsewhere classified: Secondary | ICD-10-CM | POA: Diagnosis not present

## 2020-11-13 DIAGNOSIS — M6281 Muscle weakness (generalized): Secondary | ICD-10-CM | POA: Diagnosis not present

## 2020-11-15 DIAGNOSIS — R262 Difficulty in walking, not elsewhere classified: Secondary | ICD-10-CM | POA: Diagnosis not present

## 2020-11-15 DIAGNOSIS — Z741 Need for assistance with personal care: Secondary | ICD-10-CM | POA: Diagnosis not present

## 2020-11-15 DIAGNOSIS — M6281 Muscle weakness (generalized): Secondary | ICD-10-CM | POA: Diagnosis not present

## 2020-11-15 DIAGNOSIS — J9622 Acute and chronic respiratory failure with hypercapnia: Secondary | ICD-10-CM | POA: Diagnosis not present

## 2020-11-16 DIAGNOSIS — Z741 Need for assistance with personal care: Secondary | ICD-10-CM | POA: Diagnosis not present

## 2020-11-16 DIAGNOSIS — M6281 Muscle weakness (generalized): Secondary | ICD-10-CM | POA: Diagnosis not present

## 2020-11-16 DIAGNOSIS — R262 Difficulty in walking, not elsewhere classified: Secondary | ICD-10-CM | POA: Diagnosis not present

## 2020-11-16 DIAGNOSIS — J9622 Acute and chronic respiratory failure with hypercapnia: Secondary | ICD-10-CM | POA: Diagnosis not present

## 2020-11-17 DIAGNOSIS — Z741 Need for assistance with personal care: Secondary | ICD-10-CM | POA: Diagnosis not present

## 2020-11-17 DIAGNOSIS — R262 Difficulty in walking, not elsewhere classified: Secondary | ICD-10-CM | POA: Diagnosis not present

## 2020-11-17 DIAGNOSIS — M6281 Muscle weakness (generalized): Secondary | ICD-10-CM | POA: Diagnosis not present

## 2020-11-17 DIAGNOSIS — J9622 Acute and chronic respiratory failure with hypercapnia: Secondary | ICD-10-CM | POA: Diagnosis not present

## 2020-11-18 DIAGNOSIS — R262 Difficulty in walking, not elsewhere classified: Secondary | ICD-10-CM | POA: Diagnosis not present

## 2020-11-18 DIAGNOSIS — J9622 Acute and chronic respiratory failure with hypercapnia: Secondary | ICD-10-CM | POA: Diagnosis not present

## 2020-11-18 DIAGNOSIS — M6281 Muscle weakness (generalized): Secondary | ICD-10-CM | POA: Diagnosis not present

## 2020-11-18 DIAGNOSIS — Z741 Need for assistance with personal care: Secondary | ICD-10-CM | POA: Diagnosis not present

## 2020-11-19 DIAGNOSIS — J9622 Acute and chronic respiratory failure with hypercapnia: Secondary | ICD-10-CM | POA: Diagnosis not present

## 2020-11-19 DIAGNOSIS — M6281 Muscle weakness (generalized): Secondary | ICD-10-CM | POA: Diagnosis not present

## 2020-11-19 DIAGNOSIS — Z741 Need for assistance with personal care: Secondary | ICD-10-CM | POA: Diagnosis not present

## 2020-11-19 DIAGNOSIS — R262 Difficulty in walking, not elsewhere classified: Secondary | ICD-10-CM | POA: Diagnosis not present

## 2020-11-19 DIAGNOSIS — I5032 Chronic diastolic (congestive) heart failure: Secondary | ICD-10-CM | POA: Diagnosis not present

## 2020-11-19 DIAGNOSIS — J449 Chronic obstructive pulmonary disease, unspecified: Secondary | ICD-10-CM | POA: Diagnosis not present

## 2020-11-22 DIAGNOSIS — M6281 Muscle weakness (generalized): Secondary | ICD-10-CM | POA: Diagnosis not present

## 2020-11-22 DIAGNOSIS — Z741 Need for assistance with personal care: Secondary | ICD-10-CM | POA: Diagnosis not present

## 2020-11-22 DIAGNOSIS — R262 Difficulty in walking, not elsewhere classified: Secondary | ICD-10-CM | POA: Diagnosis not present

## 2020-11-22 DIAGNOSIS — J9622 Acute and chronic respiratory failure with hypercapnia: Secondary | ICD-10-CM | POA: Diagnosis not present

## 2020-11-23 DIAGNOSIS — J449 Chronic obstructive pulmonary disease, unspecified: Secondary | ICD-10-CM | POA: Diagnosis not present

## 2020-11-23 DIAGNOSIS — Z741 Need for assistance with personal care: Secondary | ICD-10-CM | POA: Diagnosis not present

## 2020-11-23 DIAGNOSIS — J9622 Acute and chronic respiratory failure with hypercapnia: Secondary | ICD-10-CM | POA: Diagnosis not present

## 2020-11-23 DIAGNOSIS — R262 Difficulty in walking, not elsewhere classified: Secondary | ICD-10-CM | POA: Diagnosis not present

## 2020-11-23 DIAGNOSIS — M6281 Muscle weakness (generalized): Secondary | ICD-10-CM | POA: Diagnosis not present

## 2020-11-23 DIAGNOSIS — E119 Type 2 diabetes mellitus without complications: Secondary | ICD-10-CM | POA: Diagnosis not present

## 2020-11-24 DIAGNOSIS — M6281 Muscle weakness (generalized): Secondary | ICD-10-CM | POA: Diagnosis not present

## 2020-11-24 DIAGNOSIS — Z741 Need for assistance with personal care: Secondary | ICD-10-CM | POA: Diagnosis not present

## 2020-11-24 DIAGNOSIS — R262 Difficulty in walking, not elsewhere classified: Secondary | ICD-10-CM | POA: Diagnosis not present

## 2020-11-24 DIAGNOSIS — J9622 Acute and chronic respiratory failure with hypercapnia: Secondary | ICD-10-CM | POA: Diagnosis not present

## 2020-11-25 DIAGNOSIS — R262 Difficulty in walking, not elsewhere classified: Secondary | ICD-10-CM | POA: Diagnosis not present

## 2020-11-25 DIAGNOSIS — J9622 Acute and chronic respiratory failure with hypercapnia: Secondary | ICD-10-CM | POA: Diagnosis not present

## 2020-11-25 DIAGNOSIS — Z741 Need for assistance with personal care: Secondary | ICD-10-CM | POA: Diagnosis not present

## 2020-11-25 DIAGNOSIS — M6281 Muscle weakness (generalized): Secondary | ICD-10-CM | POA: Diagnosis not present

## 2020-11-26 DIAGNOSIS — Z741 Need for assistance with personal care: Secondary | ICD-10-CM | POA: Diagnosis not present

## 2020-11-26 DIAGNOSIS — M6281 Muscle weakness (generalized): Secondary | ICD-10-CM | POA: Diagnosis not present

## 2020-11-26 DIAGNOSIS — J9622 Acute and chronic respiratory failure with hypercapnia: Secondary | ICD-10-CM | POA: Diagnosis not present

## 2020-11-26 DIAGNOSIS — R262 Difficulty in walking, not elsewhere classified: Secondary | ICD-10-CM | POA: Diagnosis not present

## 2020-11-28 DIAGNOSIS — M6281 Muscle weakness (generalized): Secondary | ICD-10-CM | POA: Diagnosis not present

## 2020-11-28 DIAGNOSIS — R262 Difficulty in walking, not elsewhere classified: Secondary | ICD-10-CM | POA: Diagnosis not present

## 2020-11-28 DIAGNOSIS — Z741 Need for assistance with personal care: Secondary | ICD-10-CM | POA: Diagnosis not present

## 2020-11-28 DIAGNOSIS — J9622 Acute and chronic respiratory failure with hypercapnia: Secondary | ICD-10-CM | POA: Diagnosis not present

## 2020-11-29 DIAGNOSIS — Z741 Need for assistance with personal care: Secondary | ICD-10-CM | POA: Diagnosis not present

## 2020-11-29 DIAGNOSIS — M6281 Muscle weakness (generalized): Secondary | ICD-10-CM | POA: Diagnosis not present

## 2020-11-29 DIAGNOSIS — R262 Difficulty in walking, not elsewhere classified: Secondary | ICD-10-CM | POA: Diagnosis not present

## 2020-11-29 DIAGNOSIS — J9622 Acute and chronic respiratory failure with hypercapnia: Secondary | ICD-10-CM | POA: Diagnosis not present

## 2020-12-01 DIAGNOSIS — Z741 Need for assistance with personal care: Secondary | ICD-10-CM | POA: Diagnosis not present

## 2020-12-01 DIAGNOSIS — J9622 Acute and chronic respiratory failure with hypercapnia: Secondary | ICD-10-CM | POA: Diagnosis not present

## 2020-12-01 DIAGNOSIS — R262 Difficulty in walking, not elsewhere classified: Secondary | ICD-10-CM | POA: Diagnosis not present

## 2020-12-01 DIAGNOSIS — M6281 Muscle weakness (generalized): Secondary | ICD-10-CM | POA: Diagnosis not present

## 2020-12-02 DIAGNOSIS — M6281 Muscle weakness (generalized): Secondary | ICD-10-CM | POA: Diagnosis not present

## 2020-12-02 DIAGNOSIS — J9622 Acute and chronic respiratory failure with hypercapnia: Secondary | ICD-10-CM | POA: Diagnosis not present

## 2020-12-02 DIAGNOSIS — R262 Difficulty in walking, not elsewhere classified: Secondary | ICD-10-CM | POA: Diagnosis not present

## 2020-12-02 DIAGNOSIS — Z741 Need for assistance with personal care: Secondary | ICD-10-CM | POA: Diagnosis not present

## 2020-12-03 DIAGNOSIS — J9622 Acute and chronic respiratory failure with hypercapnia: Secondary | ICD-10-CM | POA: Diagnosis not present

## 2020-12-03 DIAGNOSIS — M6281 Muscle weakness (generalized): Secondary | ICD-10-CM | POA: Diagnosis not present

## 2020-12-03 DIAGNOSIS — Z741 Need for assistance with personal care: Secondary | ICD-10-CM | POA: Diagnosis not present

## 2020-12-03 DIAGNOSIS — R262 Difficulty in walking, not elsewhere classified: Secondary | ICD-10-CM | POA: Diagnosis not present

## 2020-12-06 DIAGNOSIS — Z741 Need for assistance with personal care: Secondary | ICD-10-CM | POA: Diagnosis not present

## 2020-12-06 DIAGNOSIS — J9622 Acute and chronic respiratory failure with hypercapnia: Secondary | ICD-10-CM | POA: Diagnosis not present

## 2020-12-06 DIAGNOSIS — M6281 Muscle weakness (generalized): Secondary | ICD-10-CM | POA: Diagnosis not present

## 2020-12-06 DIAGNOSIS — R262 Difficulty in walking, not elsewhere classified: Secondary | ICD-10-CM | POA: Diagnosis not present

## 2020-12-07 DIAGNOSIS — R262 Difficulty in walking, not elsewhere classified: Secondary | ICD-10-CM | POA: Diagnosis not present

## 2020-12-07 DIAGNOSIS — J9622 Acute and chronic respiratory failure with hypercapnia: Secondary | ICD-10-CM | POA: Diagnosis not present

## 2020-12-07 DIAGNOSIS — Z741 Need for assistance with personal care: Secondary | ICD-10-CM | POA: Diagnosis not present

## 2020-12-07 DIAGNOSIS — M6281 Muscle weakness (generalized): Secondary | ICD-10-CM | POA: Diagnosis not present

## 2020-12-08 DIAGNOSIS — M6281 Muscle weakness (generalized): Secondary | ICD-10-CM | POA: Diagnosis not present

## 2020-12-08 DIAGNOSIS — J9622 Acute and chronic respiratory failure with hypercapnia: Secondary | ICD-10-CM | POA: Diagnosis not present

## 2020-12-08 DIAGNOSIS — R262 Difficulty in walking, not elsewhere classified: Secondary | ICD-10-CM | POA: Diagnosis not present

## 2020-12-08 DIAGNOSIS — Z741 Need for assistance with personal care: Secondary | ICD-10-CM | POA: Diagnosis not present

## 2020-12-09 DIAGNOSIS — R262 Difficulty in walking, not elsewhere classified: Secondary | ICD-10-CM | POA: Diagnosis not present

## 2020-12-09 DIAGNOSIS — Z741 Need for assistance with personal care: Secondary | ICD-10-CM | POA: Diagnosis not present

## 2020-12-09 DIAGNOSIS — J9622 Acute and chronic respiratory failure with hypercapnia: Secondary | ICD-10-CM | POA: Diagnosis not present

## 2020-12-09 DIAGNOSIS — M6281 Muscle weakness (generalized): Secondary | ICD-10-CM | POA: Diagnosis not present

## 2020-12-10 DIAGNOSIS — R262 Difficulty in walking, not elsewhere classified: Secondary | ICD-10-CM | POA: Diagnosis not present

## 2020-12-10 DIAGNOSIS — Z741 Need for assistance with personal care: Secondary | ICD-10-CM | POA: Diagnosis not present

## 2020-12-10 DIAGNOSIS — M6281 Muscle weakness (generalized): Secondary | ICD-10-CM | POA: Diagnosis not present

## 2020-12-10 DIAGNOSIS — J9622 Acute and chronic respiratory failure with hypercapnia: Secondary | ICD-10-CM | POA: Diagnosis not present

## 2020-12-13 DIAGNOSIS — R262 Difficulty in walking, not elsewhere classified: Secondary | ICD-10-CM | POA: Diagnosis not present

## 2020-12-13 DIAGNOSIS — J9622 Acute and chronic respiratory failure with hypercapnia: Secondary | ICD-10-CM | POA: Diagnosis not present

## 2020-12-13 DIAGNOSIS — M6281 Muscle weakness (generalized): Secondary | ICD-10-CM | POA: Diagnosis not present

## 2020-12-13 DIAGNOSIS — Z741 Need for assistance with personal care: Secondary | ICD-10-CM | POA: Diagnosis not present

## 2020-12-14 DIAGNOSIS — M6281 Muscle weakness (generalized): Secondary | ICD-10-CM | POA: Diagnosis not present

## 2020-12-14 DIAGNOSIS — Z741 Need for assistance with personal care: Secondary | ICD-10-CM | POA: Diagnosis not present

## 2020-12-14 DIAGNOSIS — J9622 Acute and chronic respiratory failure with hypercapnia: Secondary | ICD-10-CM | POA: Diagnosis not present

## 2020-12-14 DIAGNOSIS — R262 Difficulty in walking, not elsewhere classified: Secondary | ICD-10-CM | POA: Diagnosis not present

## 2020-12-15 DIAGNOSIS — J9622 Acute and chronic respiratory failure with hypercapnia: Secondary | ICD-10-CM | POA: Diagnosis not present

## 2020-12-15 DIAGNOSIS — Z741 Need for assistance with personal care: Secondary | ICD-10-CM | POA: Diagnosis not present

## 2020-12-15 DIAGNOSIS — R262 Difficulty in walking, not elsewhere classified: Secondary | ICD-10-CM | POA: Diagnosis not present

## 2020-12-15 DIAGNOSIS — M6281 Muscle weakness (generalized): Secondary | ICD-10-CM | POA: Diagnosis not present

## 2020-12-16 DIAGNOSIS — Z741 Need for assistance with personal care: Secondary | ICD-10-CM | POA: Diagnosis not present

## 2020-12-16 DIAGNOSIS — J9622 Acute and chronic respiratory failure with hypercapnia: Secondary | ICD-10-CM | POA: Diagnosis not present

## 2020-12-16 DIAGNOSIS — R262 Difficulty in walking, not elsewhere classified: Secondary | ICD-10-CM | POA: Diagnosis not present

## 2020-12-16 DIAGNOSIS — M6281 Muscle weakness (generalized): Secondary | ICD-10-CM | POA: Diagnosis not present

## 2020-12-17 DIAGNOSIS — M6281 Muscle weakness (generalized): Secondary | ICD-10-CM | POA: Diagnosis not present

## 2020-12-17 DIAGNOSIS — R262 Difficulty in walking, not elsewhere classified: Secondary | ICD-10-CM | POA: Diagnosis not present

## 2020-12-17 DIAGNOSIS — J9622 Acute and chronic respiratory failure with hypercapnia: Secondary | ICD-10-CM | POA: Diagnosis not present

## 2020-12-17 DIAGNOSIS — Z741 Need for assistance with personal care: Secondary | ICD-10-CM | POA: Diagnosis not present

## 2020-12-20 IMAGING — DX PORTABLE CHEST - 1 VIEW
1 series · 1 of 1 positions shown · non-contrast
Comparison: April 13, 2018

CLINICAL DATA: Acute mental status change.

EXAM:
PORTABLE CHEST 1 VIEW

[chest ap]
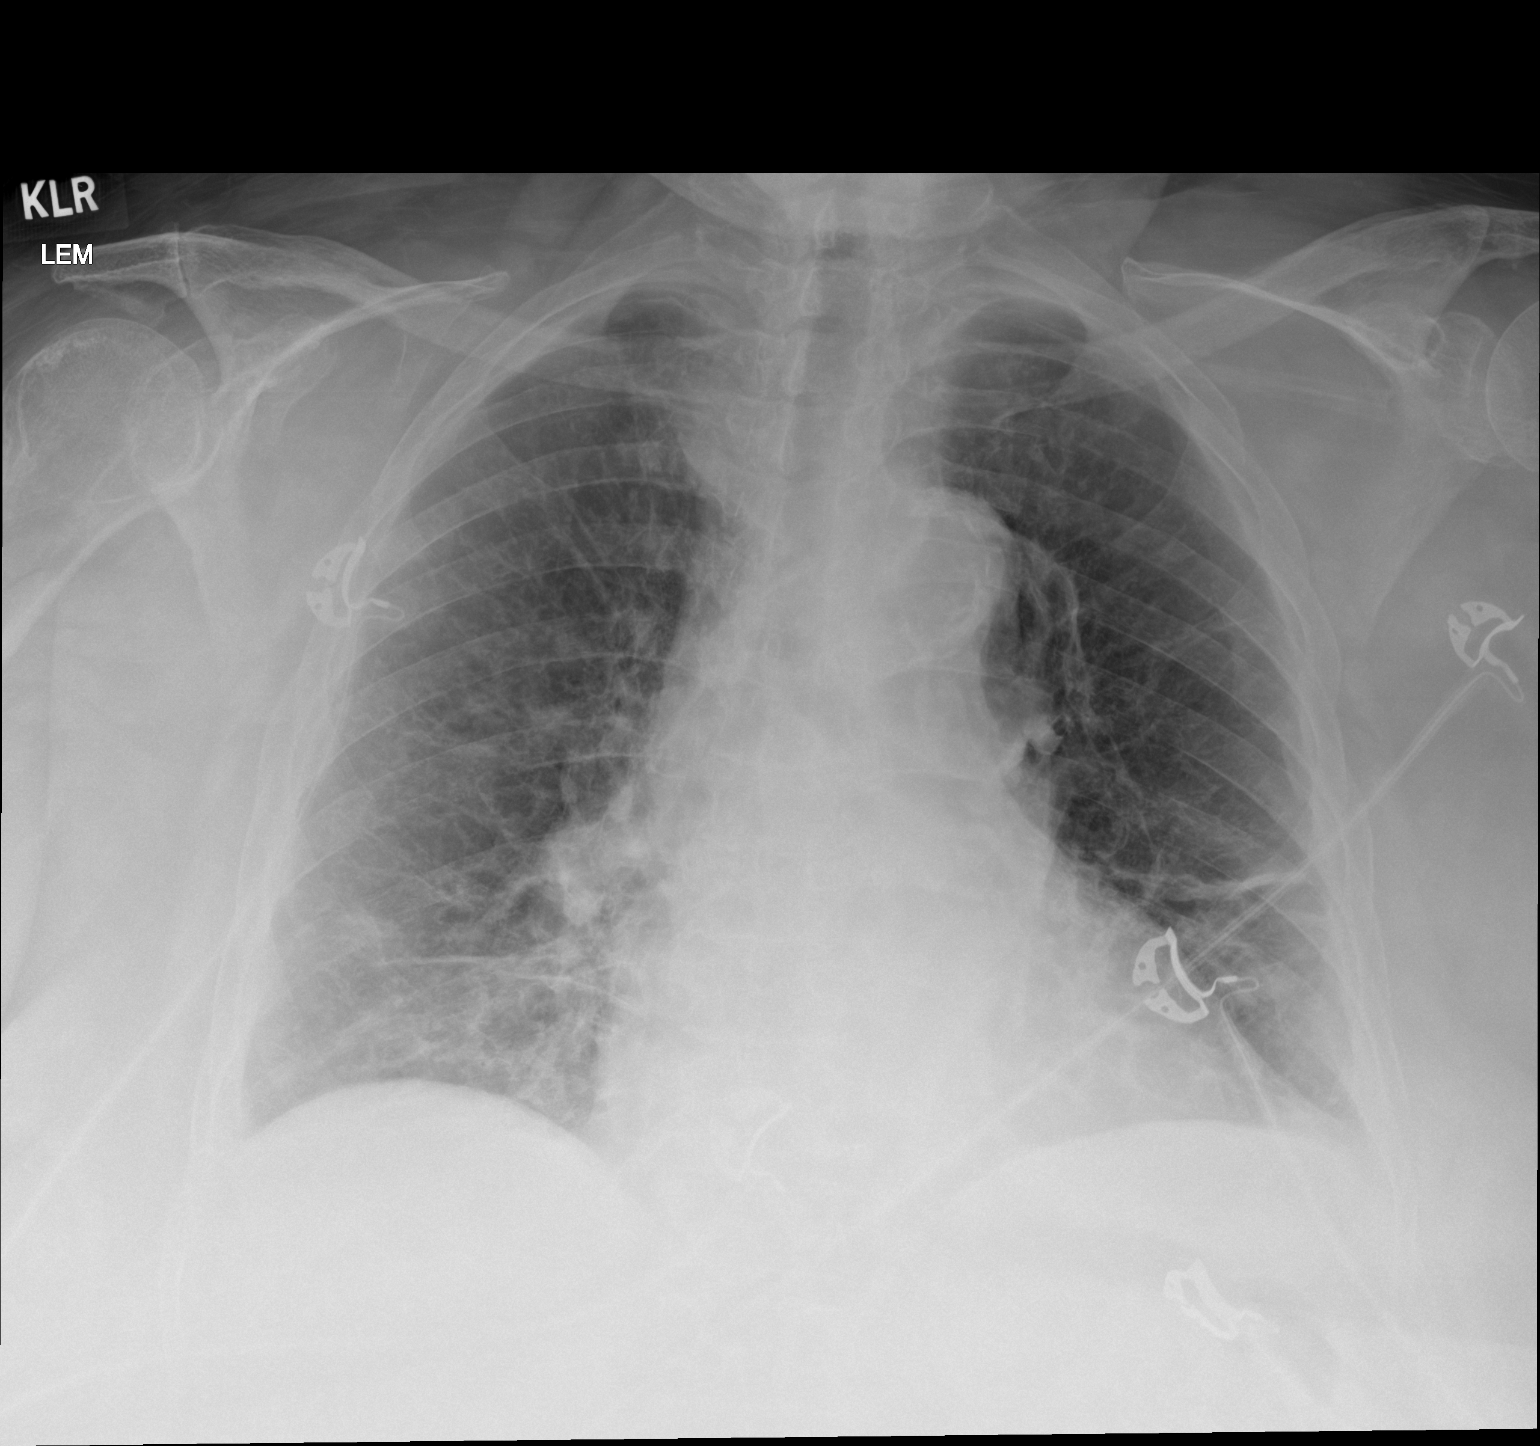

[1 of 1 positions shown; findings below may reference images not displayed]

FINDINGS: The heart size borderline. The hila and mediastinum are
unremarkable. Streaky opacity in left lung, particularly the base,
is identified and new. Mild opacity also seen in the right base, new
as well. No other acute abnormalities.
IMPRESSION: Bilateral streaky opacities identified. The opacities could
represent atelectasis or developing infiltrate. Recommend clinical
correlation and attention on follow-up.

## 2020-12-21 DIAGNOSIS — G40909 Epilepsy, unspecified, not intractable, without status epilepticus: Secondary | ICD-10-CM | POA: Diagnosis not present

## 2020-12-21 DIAGNOSIS — N390 Urinary tract infection, site not specified: Secondary | ICD-10-CM | POA: Diagnosis not present

## 2020-12-21 IMAGING — DX CHEST  1 VIEW
1 series · 1 of 1 positions shown · non-contrast
Comparison: One-view chest x-ray 06/10/2018

CLINICAL DATA: Shortness of breath.

EXAM:
CHEST  1 VIEW

[chest ap]
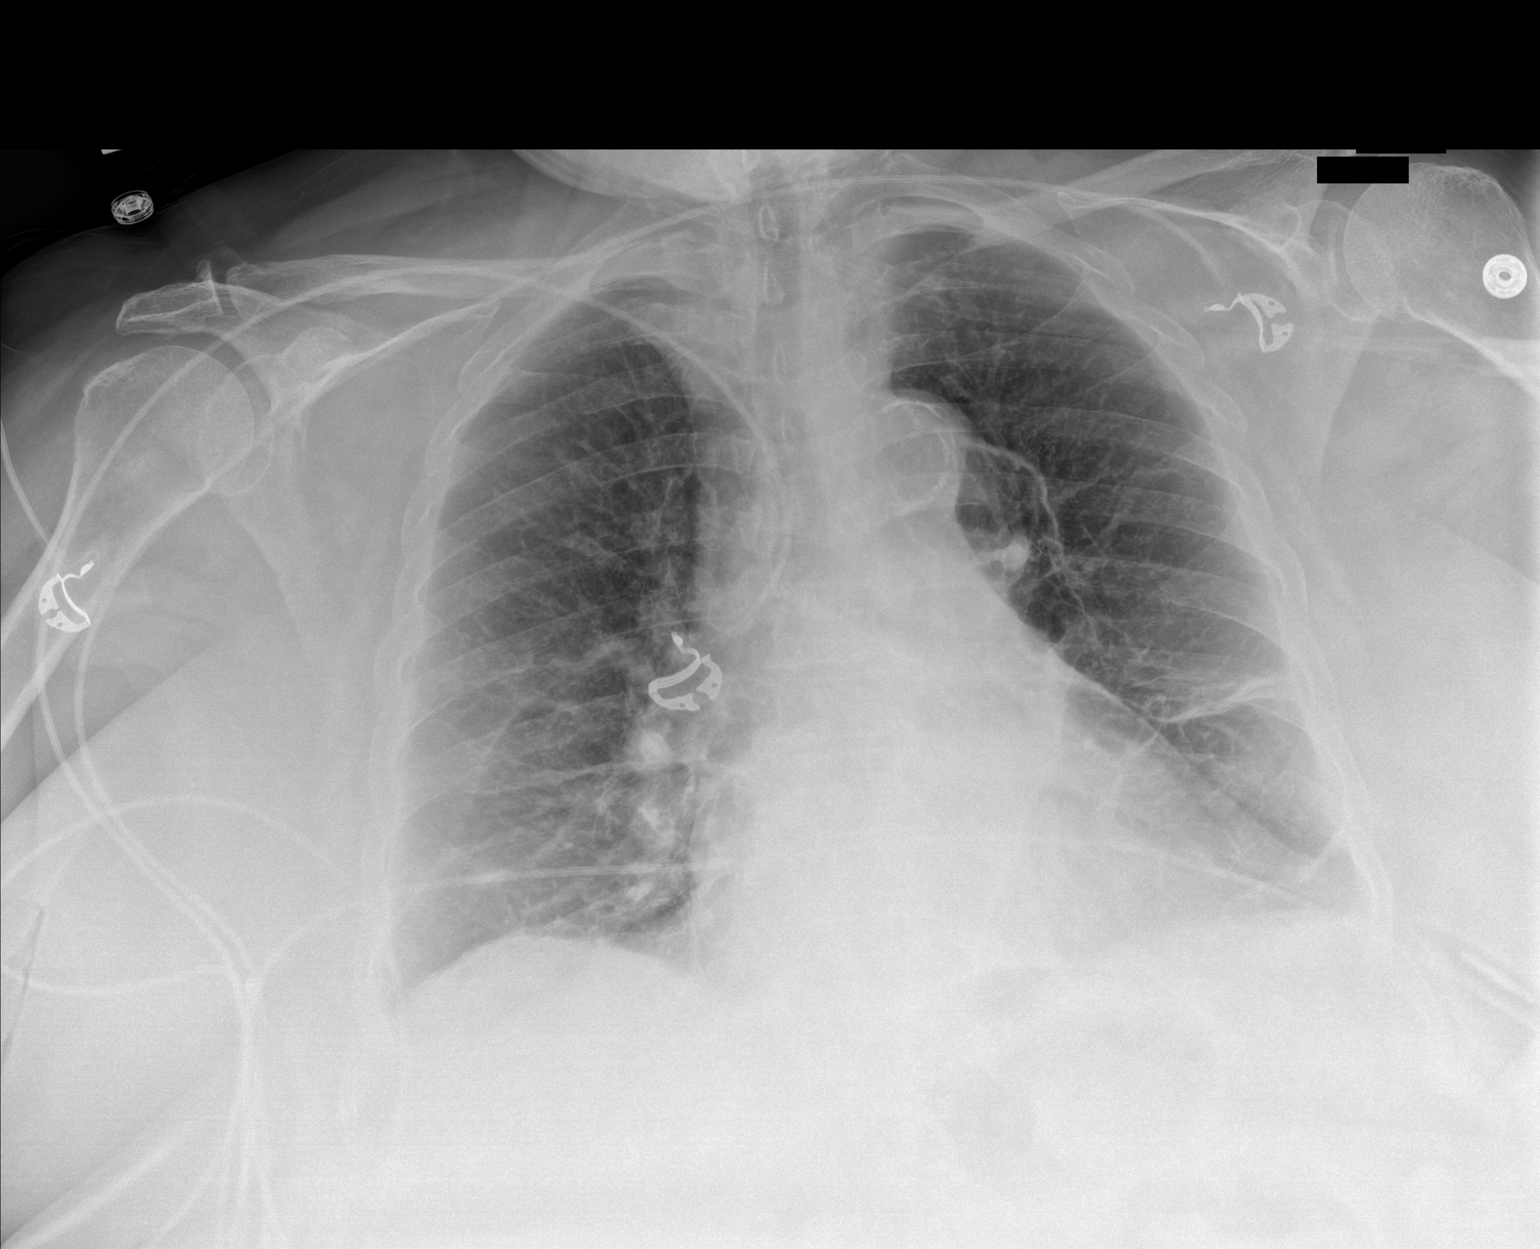

[1 of 1 positions shown; findings below may reference images not displayed]

FINDINGS: Heart is enlarged. Atherosclerotic calcifications are noted at the
aortic arch. Multiple linear opacities are similar the prior study.
Lung volumes have decreased slightly. No significant airspace
consolidation is present.
IMPRESSION: 1. Similar appearance of linear opacities likely reflecting
atelectasis.
2. Decreasing lung volumes.

## 2020-12-21 IMAGING — US ULTRASOUND ABDOMEN LIMITED
1 series · 14 of 25 positions shown · non-contrast
Comparison: CT 05/04/2017

CLINICAL DATA: Elevated liver enzymes.

EXAM:
ULTRASOUND ABDOMEN LIMITED RIGHT UPPER QUADRANT

[Series 1: ultrasound abdomen limited · 14 of 27 slices shown]
[im 1/27]
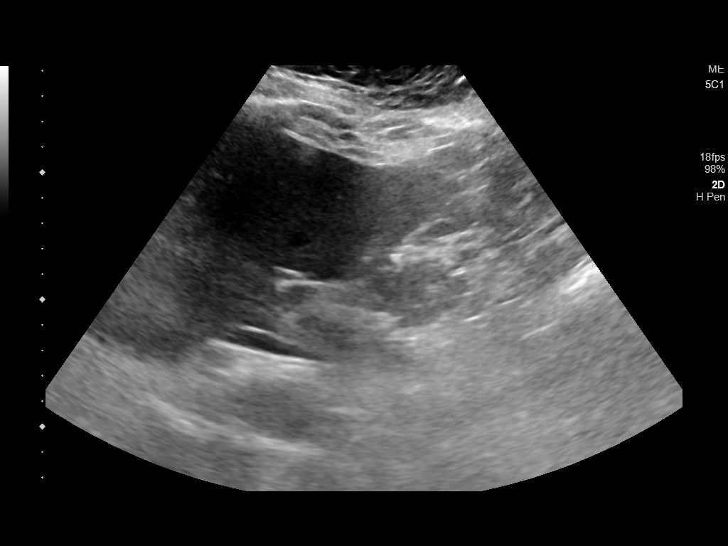
[im 3/27]
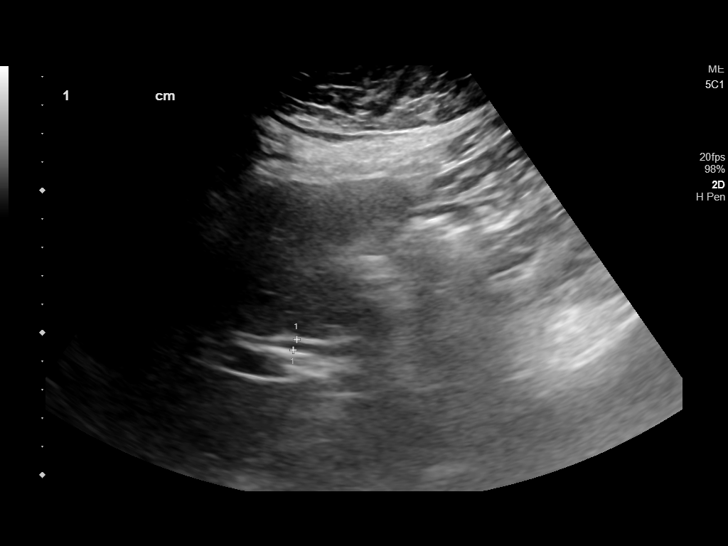
[im 5/27]
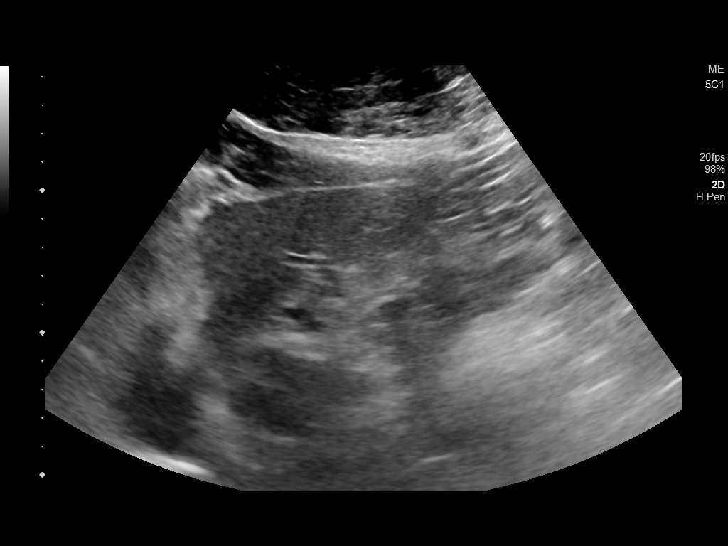
[im 7/27]
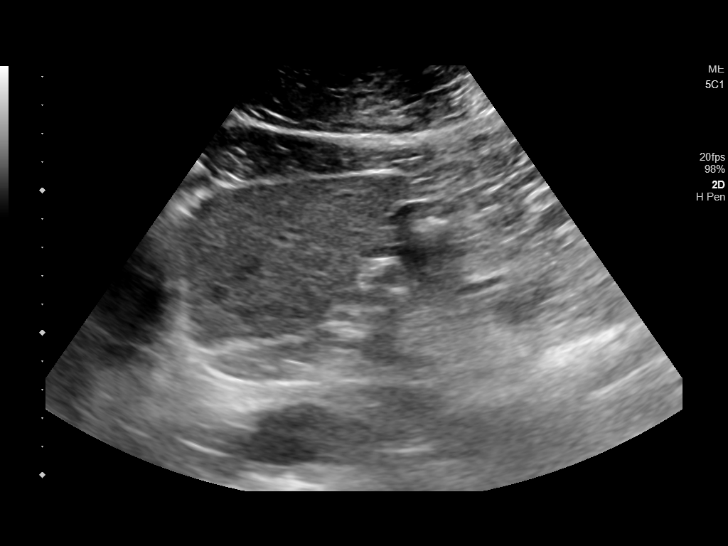
[im 9/27]
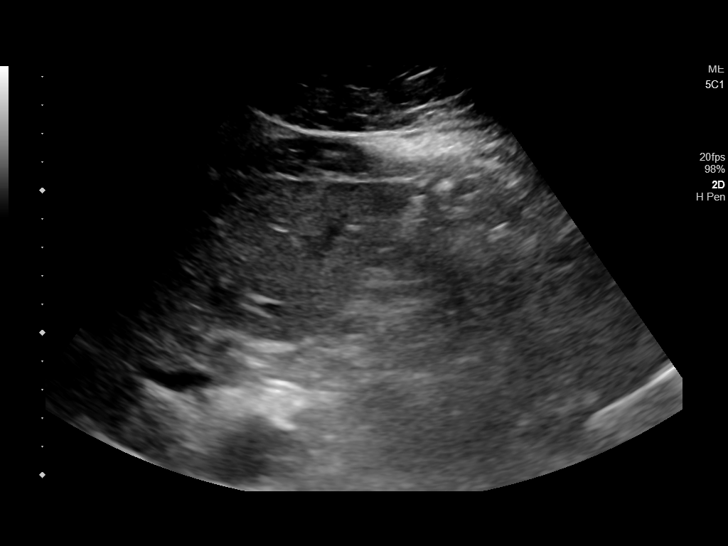
[im 10/27]
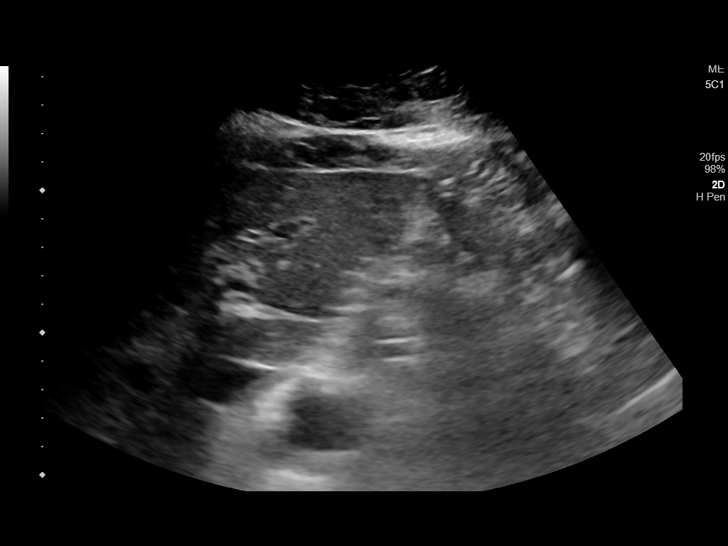
[im 12/27]
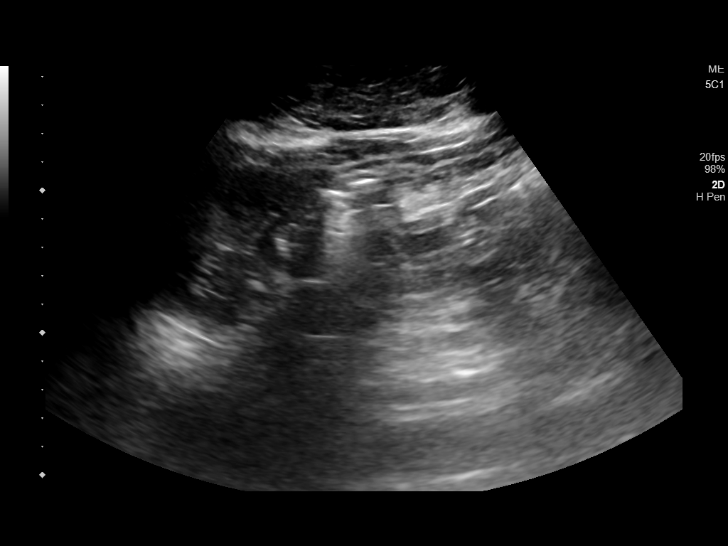
[im 15/27]
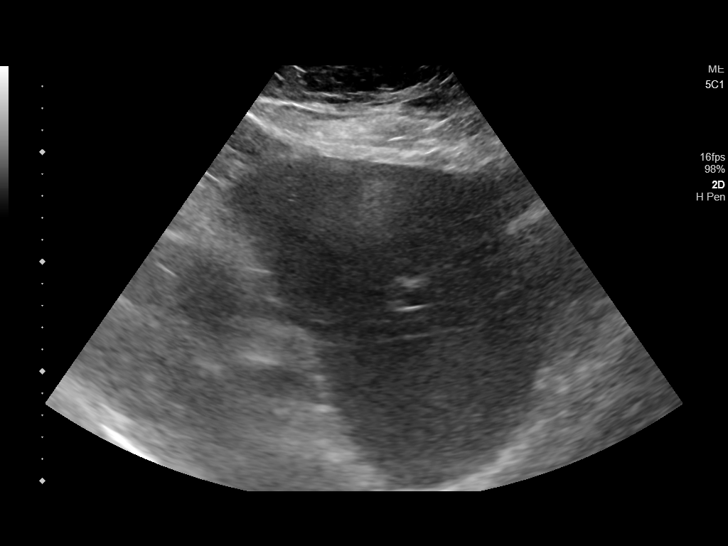
[im 17/27]
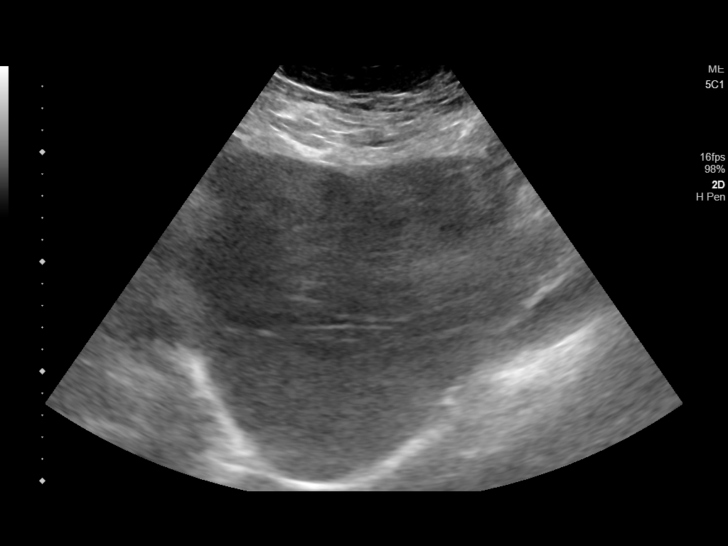
[im 18/27]
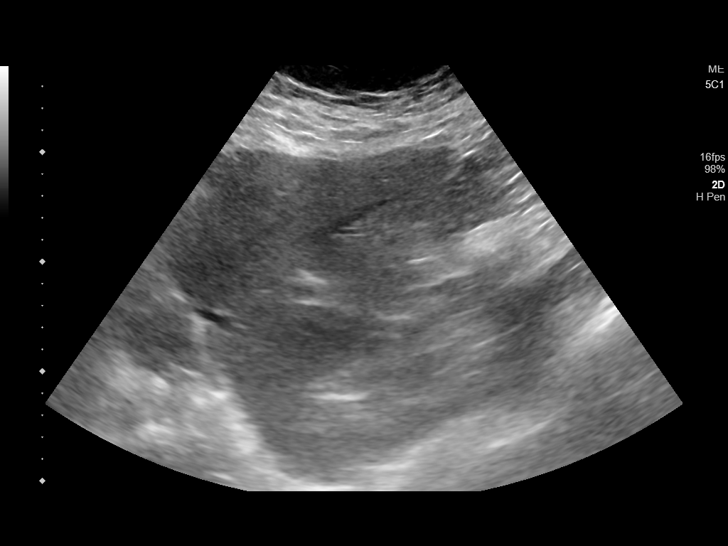
[im 20/27]
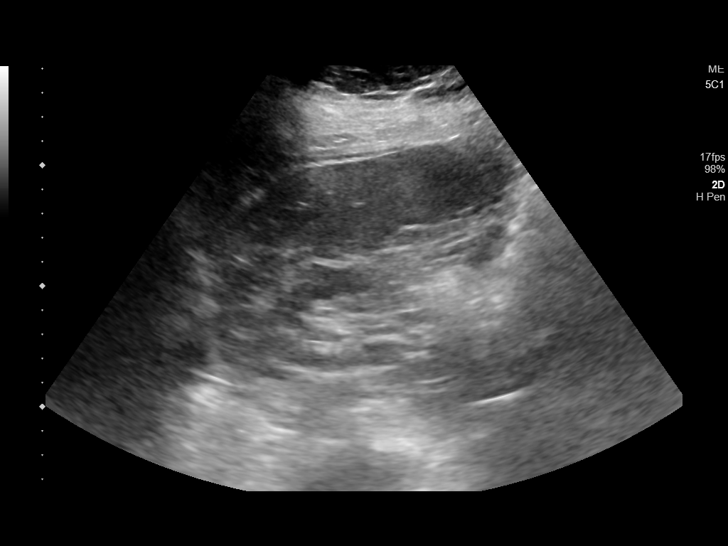
[im 22/27]
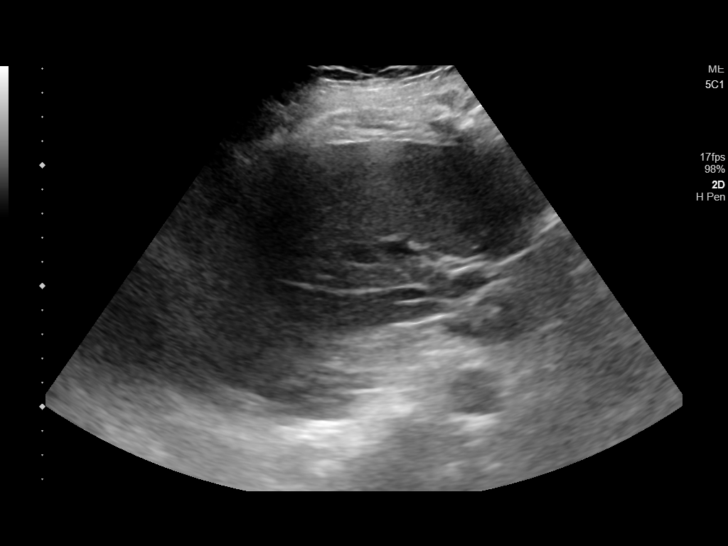
[im 24/27]
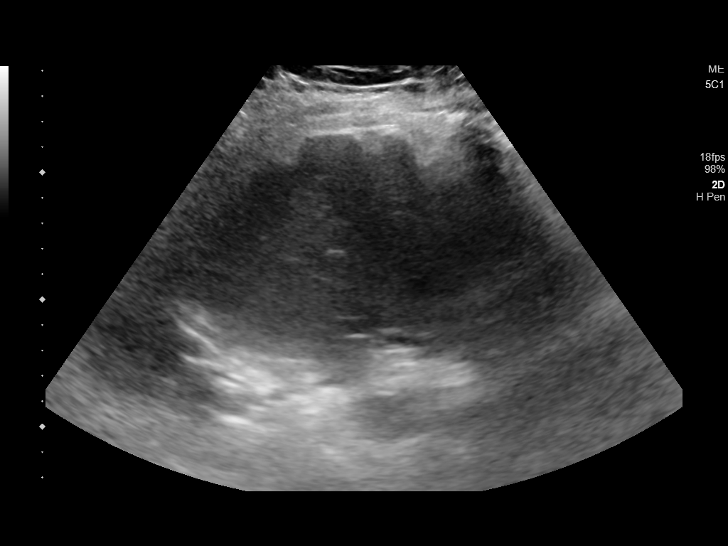
[im 27/27]
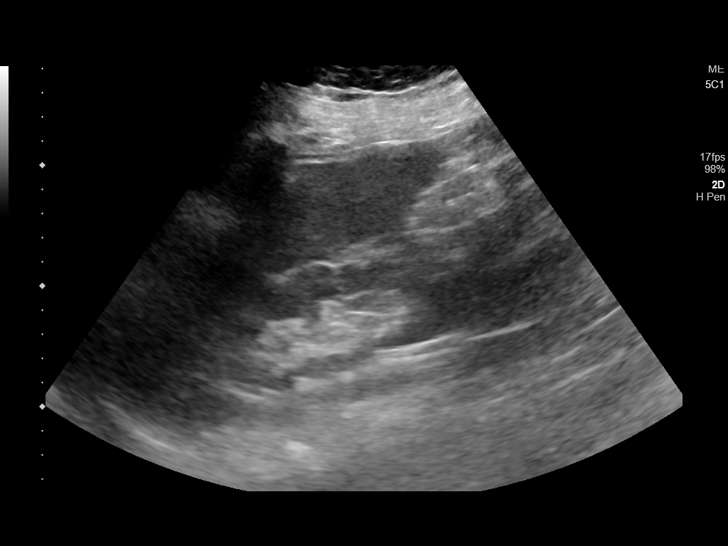

[14 of 25 positions shown; findings below may reference images not displayed]

FINDINGS: Gallbladder:

Previous cholecystectomy.

Common bile duct:

Diameter: 4 mm.

Liver:

No focal lesion identified. Somewhat coarse parenchymal echogenicity
with suggestion of nodular contour. Portal vein is patent on color
Doppler imaging with normal direction of blood flow towards the
liver.
IMPRESSION: No acute findings.  Previous cholecystectomy.

Suggestion of hepatic steatosis without focal mass.

## 2020-12-22 DIAGNOSIS — I10 Essential (primary) hypertension: Secondary | ICD-10-CM | POA: Diagnosis not present

## 2020-12-22 DIAGNOSIS — J9622 Acute and chronic respiratory failure with hypercapnia: Secondary | ICD-10-CM | POA: Diagnosis not present

## 2020-12-22 DIAGNOSIS — R262 Difficulty in walking, not elsewhere classified: Secondary | ICD-10-CM | POA: Diagnosis not present

## 2020-12-22 DIAGNOSIS — M6281 Muscle weakness (generalized): Secondary | ICD-10-CM | POA: Diagnosis not present

## 2020-12-22 DIAGNOSIS — Z741 Need for assistance with personal care: Secondary | ICD-10-CM | POA: Diagnosis not present

## 2020-12-23 DIAGNOSIS — Z741 Need for assistance with personal care: Secondary | ICD-10-CM | POA: Diagnosis not present

## 2020-12-23 DIAGNOSIS — R262 Difficulty in walking, not elsewhere classified: Secondary | ICD-10-CM | POA: Diagnosis not present

## 2020-12-23 DIAGNOSIS — M6281 Muscle weakness (generalized): Secondary | ICD-10-CM | POA: Diagnosis not present

## 2020-12-23 DIAGNOSIS — N390 Urinary tract infection, site not specified: Secondary | ICD-10-CM | POA: Diagnosis not present

## 2020-12-23 DIAGNOSIS — J9622 Acute and chronic respiratory failure with hypercapnia: Secondary | ICD-10-CM | POA: Diagnosis not present

## 2020-12-23 DIAGNOSIS — B964 Proteus (mirabilis) (morganii) as the cause of diseases classified elsewhere: Secondary | ICD-10-CM | POA: Diagnosis not present

## 2020-12-23 DIAGNOSIS — I10 Essential (primary) hypertension: Secondary | ICD-10-CM | POA: Diagnosis not present

## 2020-12-27 DIAGNOSIS — J9622 Acute and chronic respiratory failure with hypercapnia: Secondary | ICD-10-CM | POA: Diagnosis not present

## 2020-12-27 DIAGNOSIS — M6281 Muscle weakness (generalized): Secondary | ICD-10-CM | POA: Diagnosis not present

## 2020-12-27 DIAGNOSIS — Z741 Need for assistance with personal care: Secondary | ICD-10-CM | POA: Diagnosis not present

## 2020-12-27 DIAGNOSIS — R262 Difficulty in walking, not elsewhere classified: Secondary | ICD-10-CM | POA: Diagnosis not present

## 2020-12-28 DIAGNOSIS — M6281 Muscle weakness (generalized): Secondary | ICD-10-CM | POA: Diagnosis not present

## 2020-12-28 DIAGNOSIS — R262 Difficulty in walking, not elsewhere classified: Secondary | ICD-10-CM | POA: Diagnosis not present

## 2020-12-28 DIAGNOSIS — J9622 Acute and chronic respiratory failure with hypercapnia: Secondary | ICD-10-CM | POA: Diagnosis not present

## 2020-12-28 DIAGNOSIS — Z741 Need for assistance with personal care: Secondary | ICD-10-CM | POA: Diagnosis not present

## 2020-12-29 DIAGNOSIS — R262 Difficulty in walking, not elsewhere classified: Secondary | ICD-10-CM | POA: Diagnosis not present

## 2020-12-29 DIAGNOSIS — Z741 Need for assistance with personal care: Secondary | ICD-10-CM | POA: Diagnosis not present

## 2020-12-29 DIAGNOSIS — J9622 Acute and chronic respiratory failure with hypercapnia: Secondary | ICD-10-CM | POA: Diagnosis not present

## 2020-12-29 DIAGNOSIS — M6281 Muscle weakness (generalized): Secondary | ICD-10-CM | POA: Diagnosis not present

## 2020-12-31 DIAGNOSIS — M6281 Muscle weakness (generalized): Secondary | ICD-10-CM | POA: Diagnosis not present

## 2020-12-31 DIAGNOSIS — R262 Difficulty in walking, not elsewhere classified: Secondary | ICD-10-CM | POA: Diagnosis not present

## 2020-12-31 DIAGNOSIS — Z741 Need for assistance with personal care: Secondary | ICD-10-CM | POA: Diagnosis not present

## 2020-12-31 DIAGNOSIS — J9622 Acute and chronic respiratory failure with hypercapnia: Secondary | ICD-10-CM | POA: Diagnosis not present

## 2021-01-02 DIAGNOSIS — M6281 Muscle weakness (generalized): Secondary | ICD-10-CM | POA: Diagnosis not present

## 2021-01-02 DIAGNOSIS — R262 Difficulty in walking, not elsewhere classified: Secondary | ICD-10-CM | POA: Diagnosis not present

## 2021-01-02 DIAGNOSIS — J9622 Acute and chronic respiratory failure with hypercapnia: Secondary | ICD-10-CM | POA: Diagnosis not present

## 2021-01-02 DIAGNOSIS — Z741 Need for assistance with personal care: Secondary | ICD-10-CM | POA: Diagnosis not present

## 2021-01-03 DIAGNOSIS — R262 Difficulty in walking, not elsewhere classified: Secondary | ICD-10-CM | POA: Diagnosis not present

## 2021-01-03 DIAGNOSIS — J9622 Acute and chronic respiratory failure with hypercapnia: Secondary | ICD-10-CM | POA: Diagnosis not present

## 2021-01-03 DIAGNOSIS — Z741 Need for assistance with personal care: Secondary | ICD-10-CM | POA: Diagnosis not present

## 2021-01-03 DIAGNOSIS — M6281 Muscle weakness (generalized): Secondary | ICD-10-CM | POA: Diagnosis not present

## 2021-01-04 DIAGNOSIS — M6281 Muscle weakness (generalized): Secondary | ICD-10-CM | POA: Diagnosis not present

## 2021-01-04 DIAGNOSIS — R262 Difficulty in walking, not elsewhere classified: Secondary | ICD-10-CM | POA: Diagnosis not present

## 2021-01-04 DIAGNOSIS — Z741 Need for assistance with personal care: Secondary | ICD-10-CM | POA: Diagnosis not present

## 2021-01-04 DIAGNOSIS — J9622 Acute and chronic respiratory failure with hypercapnia: Secondary | ICD-10-CM | POA: Diagnosis not present

## 2021-01-07 DIAGNOSIS — R262 Difficulty in walking, not elsewhere classified: Secondary | ICD-10-CM | POA: Diagnosis not present

## 2021-01-07 DIAGNOSIS — Z741 Need for assistance with personal care: Secondary | ICD-10-CM | POA: Diagnosis not present

## 2021-01-07 DIAGNOSIS — M6281 Muscle weakness (generalized): Secondary | ICD-10-CM | POA: Diagnosis not present

## 2021-01-07 DIAGNOSIS — J9622 Acute and chronic respiratory failure with hypercapnia: Secondary | ICD-10-CM | POA: Diagnosis not present

## 2021-01-08 DIAGNOSIS — R262 Difficulty in walking, not elsewhere classified: Secondary | ICD-10-CM | POA: Diagnosis not present

## 2021-01-08 DIAGNOSIS — M6281 Muscle weakness (generalized): Secondary | ICD-10-CM | POA: Diagnosis not present

## 2021-01-08 DIAGNOSIS — J9622 Acute and chronic respiratory failure with hypercapnia: Secondary | ICD-10-CM | POA: Diagnosis not present

## 2021-01-08 DIAGNOSIS — Z741 Need for assistance with personal care: Secondary | ICD-10-CM | POA: Diagnosis not present

## 2021-01-11 DIAGNOSIS — J9622 Acute and chronic respiratory failure with hypercapnia: Secondary | ICD-10-CM | POA: Diagnosis not present

## 2021-01-11 DIAGNOSIS — M6281 Muscle weakness (generalized): Secondary | ICD-10-CM | POA: Diagnosis not present

## 2021-01-11 DIAGNOSIS — R262 Difficulty in walking, not elsewhere classified: Secondary | ICD-10-CM | POA: Diagnosis not present

## 2021-01-11 DIAGNOSIS — Z741 Need for assistance with personal care: Secondary | ICD-10-CM | POA: Diagnosis not present

## 2021-01-12 ENCOUNTER — Emergency Department
Admission: EM | Admit: 2021-01-12 | Discharge: 2021-01-12 | Disposition: A | Discharge status: Skilled Nursing Facility | Payer: Medicare HMO | Attending: Emergency Medicine | Admitting: Emergency Medicine

## 2021-01-12 ENCOUNTER — Emergency Department: Payer: Medicare HMO

## 2021-01-12 DIAGNOSIS — Z7401 Bed confinement status: Secondary | ICD-10-CM | POA: Diagnosis not present

## 2021-01-12 DIAGNOSIS — N184 Chronic kidney disease, stage 4 (severe): Secondary | ICD-10-CM | POA: Diagnosis not present

## 2021-01-12 DIAGNOSIS — N3000 Acute cystitis without hematuria: Secondary | ICD-10-CM | POA: Diagnosis not present

## 2021-01-12 DIAGNOSIS — I5032 Chronic diastolic (congestive) heart failure: Secondary | ICD-10-CM | POA: Diagnosis not present

## 2021-01-12 DIAGNOSIS — J441 Chronic obstructive pulmonary disease with (acute) exacerbation: Secondary | ICD-10-CM | POA: Insufficient documentation

## 2021-01-12 DIAGNOSIS — I2510 Atherosclerotic heart disease of native coronary artery without angina pectoris: Secondary | ICD-10-CM | POA: Diagnosis not present

## 2021-01-12 DIAGNOSIS — R0602 Shortness of breath: Secondary | ICD-10-CM | POA: Diagnosis not present

## 2021-01-12 DIAGNOSIS — I130 Hypertensive heart and chronic kidney disease with heart failure and stage 1 through stage 4 chronic kidney disease, or unspecified chronic kidney disease: Secondary | ICD-10-CM | POA: Insufficient documentation

## 2021-01-12 DIAGNOSIS — F29 Unspecified psychosis not due to a substance or known physiological condition: Secondary | ICD-10-CM | POA: Diagnosis not present

## 2021-01-12 DIAGNOSIS — G4489 Other headache syndrome: Secondary | ICD-10-CM | POA: Diagnosis not present

## 2021-01-12 DIAGNOSIS — Z8616 Personal history of COVID-19: Secondary | ICD-10-CM | POA: Insufficient documentation

## 2021-01-12 DIAGNOSIS — Z87891 Personal history of nicotine dependence: Secondary | ICD-10-CM | POA: Insufficient documentation

## 2021-01-12 DIAGNOSIS — J811 Chronic pulmonary edema: Secondary | ICD-10-CM | POA: Diagnosis not present

## 2021-01-12 DIAGNOSIS — Z20822 Contact with and (suspected) exposure to covid-19: Secondary | ICD-10-CM | POA: Insufficient documentation

## 2021-01-12 DIAGNOSIS — I7143 Infrarenal abdominal aortic aneurysm, without rupture: Secondary | ICD-10-CM | POA: Diagnosis not present

## 2021-01-12 DIAGNOSIS — E1122 Type 2 diabetes mellitus with diabetic chronic kidney disease: Secondary | ICD-10-CM | POA: Diagnosis not present

## 2021-01-12 DIAGNOSIS — N200 Calculus of kidney: Secondary | ICD-10-CM | POA: Diagnosis not present

## 2021-01-12 DIAGNOSIS — Z96651 Presence of right artificial knee joint: Secondary | ICD-10-CM | POA: Insufficient documentation

## 2021-01-12 DIAGNOSIS — R109 Unspecified abdominal pain: Secondary | ICD-10-CM | POA: Diagnosis present

## 2021-01-12 DIAGNOSIS — R0902 Hypoxemia: Secondary | ICD-10-CM | POA: Diagnosis not present

## 2021-01-12 DIAGNOSIS — R404 Transient alteration of awareness: Secondary | ICD-10-CM | POA: Diagnosis not present

## 2021-01-12 DIAGNOSIS — W19XXXA Unspecified fall, initial encounter: Secondary | ICD-10-CM | POA: Diagnosis not present

## 2021-01-12 LAB — COMPREHENSIVE METABOLIC PANEL
ALT: 8 U/L (ref 0–44)
AST: 18 U/L (ref 15–41)
Albumin: 3.2 g/dL — ABNORMAL LOW (ref 3.5–5.0)
Alkaline Phosphatase: 96 U/L (ref 38–126)
Anion gap: 3 — ABNORMAL LOW (ref 5–15)
BUN: 20 mg/dL (ref 8–23)
CO2: 31 mmol/L (ref 22–32)
Calcium: 8.9 mg/dL (ref 8.9–10.3)
Chloride: 106 mmol/L (ref 98–111)
Creatinine, Ser: 1.63 mg/dL — ABNORMAL HIGH (ref 0.44–1.00)
GFR, Estimated: 32 mL/min — ABNORMAL LOW (ref >60)
Glucose, Bld: 92 mg/dL (ref 70–99)
Potassium: 5.4 mmol/L — ABNORMAL HIGH (ref 3.5–5.1)
Sodium: 140 mmol/L (ref 135–145)
Total Bilirubin: 1.1 mg/dL (ref 0.3–1.2)
Total Protein: 7 g/dL (ref 6.5–8.1)

## 2021-01-12 LAB — URINALYSIS, MICROSCOPIC (REFLEX)
Bacteria, UA: NONE SEEN
WBC, UA: >50 WBC/hpf (ref 0–5)

## 2021-01-12 LAB — CBC WITH DIFFERENTIAL/PLATELET
Abs Immature Granulocytes: 0.01 10*3/uL (ref 0.00–0.07)
Basophils Absolute: 0 10*3/uL (ref 0.0–0.1)
Basophils Relative: 0 %
Eosinophils Absolute: 0.2 10*3/uL (ref 0.0–0.5)
Eosinophils Relative: 4 %
HCT: 39.5 % (ref 36.0–46.0)
Hemoglobin: 12.2 g/dL (ref 12.0–15.0)
Immature Granulocytes: 0 %
Lymphocytes Relative: 34 %
Lymphs Abs: 1.9 10*3/uL (ref 0.7–4.0)
MCH: 30 pg (ref 26.0–34.0)
MCHC: 30.9 g/dL (ref 30.0–36.0)
MCV: 97.1 fL (ref 80.0–100.0)
Monocytes Absolute: 0.4 10*3/uL (ref 0.1–1.0)
Monocytes Relative: 7 %
Neutro Abs: 3 10*3/uL (ref 1.7–7.7)
Neutrophils Relative %: 55 %
Platelets: 169 10*3/uL (ref 150–400)
RBC: 4.07 MIL/uL (ref 3.87–5.11)
RDW: 13.7 % (ref 11.5–15.5)
WBC: 5.5 10*3/uL (ref 4.0–10.5)
nRBC: 0 % (ref 0.0–0.2)

## 2021-01-12 LAB — LIPASE, BLOOD: Lipase: 40 U/L (ref 11–51)

## 2021-01-12 LAB — URINALYSIS, ROUTINE W REFLEX MICROSCOPIC
Bilirubin Urine: NEGATIVE
Glucose, UA: NEGATIVE mg/dL
Ketones, ur: NEGATIVE mg/dL
Nitrite: NEGATIVE
Specific Gravity, Urine: 1.02 (ref 1.005–1.030)
pH: 6 (ref 5.0–8.0)

## 2021-01-12 LAB — TROPONIN I (HIGH SENSITIVITY)
Troponin I (High Sensitivity): 7 ng/L (ref <18)
Troponin I (High Sensitivity): 4 ng/L (ref <18)

## 2021-01-12 LAB — RESP PANEL BY RT-PCR (FLU A&B, COVID) ARPGX2
Influenza A by PCR: NEGATIVE
Influenza B by PCR: NEGATIVE
SARS Coronavirus 2 by RT PCR: NEGATIVE

## 2021-01-12 MED ORDER — IPRATROPIUM-ALBUTEROL 0.5-2.5 (3) MG/3ML IN SOLN
3.0000 mL | Freq: Once | RESPIRATORY_TRACT | Status: AC
  Administered 2021-01-12: 3 mL via RESPIRATORY_TRACT
  Filled 2021-01-12: qty 3

## 2021-01-12 MED ORDER — IOHEXOL 300 MG/ML  SOLN
75.0000 mL | Freq: Once | INTRAMUSCULAR | Status: AC | PRN
  Administered 2021-01-12: 75 mL via INTRAVENOUS

## 2021-01-12 MED ORDER — SODIUM CHLORIDE 0.9 % IV SOLN
1.0000 g | Freq: Once | INTRAVENOUS | Status: AC
  Administered 2021-01-12: 1 g via INTRAVENOUS
  Filled 2021-01-12: qty 10

## 2021-01-12 MED ORDER — FUROSEMIDE 10 MG/ML IJ SOLN
40.0000 mg | Freq: Once | INTRAMUSCULAR | Status: AC
  Administered 2021-01-12: 40 mg via INTRAVENOUS
  Filled 2021-01-12: qty 4

## 2021-01-12 MED ORDER — CEPHALEXIN 500 MG PO CAPS: 500.0000 mg | ORAL_CAPSULE | Freq: Two times a day (BID) | ORAL | 0 refills | Status: AC

## 2021-01-12 MED ORDER — ACETAMINOPHEN 500 MG PO TABS
1000.0000 mg | ORAL_TABLET | Freq: Once | ORAL | Status: AC
  Administered 2021-01-12: 1000 mg via ORAL
  Filled 2021-01-12: qty 2

## 2021-01-12 NOTE — ED Provider Notes (Signed)
Patient received in signout from Dr. Charna Archer pending CT imaging.  CT imaging with no acute abnormality.  Does have stable known AAA.  Patient denies any pain or discomfort at this time.  Receiving antibiotics for UTI.  Does appear stable and appropriate for outpatient follow-up.   Merlyn Lot, MD 01/12/21 [phone]

## 2021-01-12 NOTE — ED Triage Notes (Addendum)
Brought in from [facility] for c/o of pain. Per EMS , staff reports Pt was calling her husband at home c/o of pain. When Pt is asked, PT is unable to express further.  EMS reports Pt at baseline uses O2 2lpm/[place]. EMS reports Pt found to have O2 sat high 80's , increased O2 4lpm , Pt's sat noted to improve high 90's

## 2021-01-12 NOTE — ED Notes (Signed)
Pt to radiology for CT at this time

## 2021-01-12 NOTE — ED Provider Notes (Signed)
[facility] Emergency Department Provider Note   ____________________________________________   Event Date/Time   First MD Initiated Contact with Patient 01/12/21 0530     (approximate)  I have reviewed the triage vital signs and the nursing notes.   HISTORY  Chief Complaint Abdominal Pain    HPI Katherine [name] is a 80 y.o. female with past medical history of hypertension, diabetes, CKD, stroke, diastolic CHF, and COPD on 2 L who presents to the ED complaining of abdominal pain.  Patient's husband reports that she has been telling him that she has been "feeling sick" for the past 2 days.  She reports a cough with some mild difficulty breathing, states that she developed pain in her abdomen around her umbilicus earlier this morning.  She describes pain as constant and sharp, not exacerbated or alleviated by nothing in particular.  She denies any associated fevers, chest pain, nausea, vomiting, diarrhea, or dysuria.  She is bedbound at baseline and staff at [facility] was concerned that she was hypoxic, however patient's oxygen was found to be disconnected on EMS arrival.  EMS reports that oxygen levels normalized with patient back on her usual 2 L.        Past Medical History:  Diagnosis Date   Angioedema    Anxiety and depression    CHF (congestive heart failure) (HCC)    Chronic constipation    Chronic kidney disease    stage 3   COPD (chronic obstructive pulmonary disease) (HCC)    Diabetes mellitus without complication (HCC)    Gallstones    GERD (gastroesophageal reflux disease)    Hypertension    Left ventricular hypertrophy    Osteoarthritis    Prurigo nodularis    Seizures (HCC)    Stroke ([place])    Tobacco abuse    Vitamin D deficiency disease     Patient Active Problem List   Diagnosis Date Noted   Respiratory failure, acute-on-chronic ([place]) 12/28/2019   Type 2 diabetes mellitus with stage 4 chronic kidney disease ([place])  12/28/2019   Sepsis due to gram-negative UTI ([place]) 06/19/2019   Type II diabetes mellitus with renal manifestations ([place]) 06/19/2019   Stroke ([place]) 06/19/2019   AAA (abdominal aortic aneurysm) 62/83/1517   Acute metabolic encephalopathy 61/60/7371   Iron deficiency anemia    Altered mental status    Acute encephalopathy 02/11/2019   Sepsis secondary to UTI ([place]) 01/08/2019   Acute renal failure superimposed on stage 3b chronic kidney disease ([place]) 01/08/2019   Seizure disorder ([place]) 01/08/2019   Diabetes mellitus type 2, controlled, with complications ([place]) 08/09/9483   Acute on chronic anemia 01/03/2019   COVID-19 12/21/2018   COVID-19 virus infection 12/21/2018   Hemoptysis 12/20/2018   Pulmonary embolism ([place]) 11/21/2018   Delirium    UTI (urinary tract infection) 08/21/2018   Chest pain 07/15/2018   Palliative care encounter 05/10/2018   Localized edema 05/10/2018   Shortness of breath 05/10/2018   Hematemesis 04/28/2018   Influenza A 04/13/2018   Acute on chronic congestive heart failure ([place])    COPD with acute exacerbation ([place]) 02/24/2018   Chronic diastolic heart failure (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   Stroke-like episode ([place]) s/p IV tpa 10/04/2017   Palliative care by specialist    Elevated rheumatoid factor 09/05/2017   Frequent hospital admissions 08/22/2017   Aphasia 06/28/2017   Right hand pain 03/21/2017   Dry skin 03/21/2017   Pain in finger of left hand 09/12/2016   Chronic fatigue 06/12/2016   Left knee pain  06/12/2016   Goals of care, counseling/discussion 03/13/2016   Primary localized osteoarthritis of right knee 02/08/2016   OSA (obstructive sleep apnea) 09/16/2015   Rotator cuff syndrome 09/07/2015   Pulmonary scarring 07/27/2015   Sleep disturbance 04/14/2015   Coronary artery disease 03/14/2015   Polyp of vocal cord 03/14/2015   Acute respiratory failure with hypoxia ([place]) 23/53/6144   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   Anxiety    Tobacco abuse    Prurigo nodularis    GERD (gastroesophageal reflux disease)    COPD (chronic obstructive pulmonary disease) ([place])    Osteoarthritis    Vitamin D deficiency disease    Chronic constipation    CKD (chronic kidney disease), stage III ([place])    Essential hypertension 05/20/2013   Morbid obesity ([place]) 05/20/2013    Past Surgical History:  Procedure Laterality Date   CHOLECYSTECTOMY     POLYPECTOMY  11/2011   vocal cord   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   Procedure: TOTAL KNEE ARTHROPLASTY;  Surgeon: Hessie Knows, MD;  Location: [facility] ORS;  Service: Orthopedics;  Laterality: Right;    Prior to Admission medications   Medication Sig Start Date End Date Taking? Authorizing Provider  cephALEXin (KEFLEX) 500 MG capsule Take 1 capsule (500 mg total) by mouth 2 (two) times daily for 7 days. 01/12/21 01/19/21 Yes Blake Divine, MD  acetaminophen (TYLENOL) 650 MG CR tablet Take 650 mg by mouth 3 (three) times daily.     [provider]  albuterol (ACCUNEB) 0.63 MG/3ML nebulizer solution Take 3 mLs by nebulization every 8 (eight) hours as needed for wheezing.     [provider]  atorvastatin (LIPITOR) 80 MG tablet Take 80 mg by mouth every evening.     [provider]  cetirizine (ZYRTEC) 10 MG tablet Take 10 mg by mouth daily.     [provider]  docusate sodium (COLACE) 100 MG capsule Take 100 mg by mouth 2 (two) times daily.    [provider]  ferrous sulfate 325 (65 FE) MG tablet  Take 325 mg by mouth 2 (two) times daily.    [provider]  Fluticasone-Umeclidin-Vilant 100-62.5-25 MCG/INH AEPB Inhale 1 puff into the lungs daily.    [provider]  gabapentin (NEURONTIN) 300 MG capsule Take 300 mg by mouth at bedtime.    [provider]  ipratropium-albuterol (DUONEB) 0.5-2.5 (3) MG/3ML SOLN Take 3 mLs by nebulization 3 (three) times daily. 07/17/18   Gladstone Lighter, MD  lacosamide 100 MG TABS Take 1 tablet (100 mg total) by mouth 2 (two) times daily. 12/31/18   Thurnell Lose, MD  levETIRAcetam (KEPPRA) 750 MG tablet Take 1 tablet (750 mg total) by mouth 2 (two) times daily. 12/30/19   Wouk, Ailene Rud, MD  linagliptin (TRADJENTA) 5 MG TABS tablet Take 5 mg by mouth daily.    [provider]  LORazepam (ATIVAN) 2 MG/ML injection Inject 0.5 mg into the vein daily as needed for seizure.    [provider]  Menthol, Topical Analgesic, (  BIOFREEZE) 4 % GEL Apply 1 application topically in the morning and at bedtime. To both knees     [provider]  mupirocin ointment (BACTROBAN) 2 % Place 1 application into the nose 2 (two) times daily. 06/13/18   Loletha Grayer, MD  nitroGLYCERIN (NITROSTAT) 0.4 MG SL tablet Place 0.4 mg under the tongue every 5 (five) minutes as needed for chest pain.  08/07/18   [provider]  pantoprazole (PROTONIX) 40 MG tablet Take 1 tablet (40 mg total) by mouth daily. 04/29/18   Bettey Costa, MD  Saccharomyces boulardii (PROBIOTIC) 250 MG CAPS Take 1 capsule by mouth 2 times daily at 12 noon and 4 pm. Patient taking differently: Take 1 capsule by mouth 2 (two) times daily.  04/05/19   Merlyn Lot, MD  Saline GEL Place 3 sprays into both nostrils 3 (three) times daily.     [provider]  vitamin C (VITAMIN C) 500 MG tablet Take 1 tablet (500 mg total) by mouth daily. 01/12/19   Allie Bossier, MD  zinc sulfate 220 (50 Zn) MG capsule Take 1 capsule (220 mg total) by  mouth daily. 01/12/19   Allie Bossier, MD    Allergies Bee venom and Enalapril maleate  Family History  Problem Relation Age of Onset   Alcohol abuse Mother    Sickle cell anemia Daughter    Hypertension Son    Cancer Neg Hx    COPD Neg Hx    Diabetes Neg Hx    Heart disease Neg Hx    Stroke Neg Hx     Social History Social History   Tobacco Use   Smoking status: Former    Packs/day: 0.50    Years: 60.00    Pack years: 30.00    Types: Cigarettes   Smokeless tobacco: Never   Tobacco comments:    quite 71mo ago-03/26/18  Vaping Use   Vaping Use: Never used  Substance Use Topics   Alcohol use: No    Alcohol/week: 0.0 standard drinks    Comment: rare   Drug use: No    Review of Systems  Constitutional: No fever/chills.  Positive for malaise. Eyes: No visual changes. ENT: No sore throat. Cardiovascular: Denies chest pain. Respiratory: Denies shortness of breath. Gastrointestinal: Positive for abdominal pain.  No nausea, no vomiting.  No diarrhea.  No constipation. Genitourinary: Negative for dysuria. Musculoskeletal: Negative for back pain. Skin: Negative for rash. Neurological: Negative for headaches, focal weakness or numbness.  ____________________________________________   PHYSICAL EXAM:  VITAL SIGNS: ED Triage Vitals  Enc Vitals Group     BP      Pulse      Resp      Temp      Temp src      SpO2      Weight      Height      Head Circumference      Peak Flow      Pain Score      Pain Loc      Pain Edu?      Excl. in [place]?     Constitutional: Alert and oriented. Eyes: Conjunctivae are normal. Head: Atraumatic. Nose: No congestion/rhinnorhea. Mouth/Throat: Mucous membranes are moist. Neck: Normal ROM Cardiovascular: Normal rate, regular rhythm. Grossly normal heart sounds.  2+ radial pulses bilaterally. Respiratory: Normal respiratory effort.  No retractions. Lungs with mild expiratory wheezing bilaterally. Gastrointestinal: Soft and  diffusely tender to palpation with no rebound or guarding. No  distention. Genitourinary: deferred Musculoskeletal: No lower extremity tenderness nor edema. Neurologic:  Normal speech and language. No gross focal neurologic deficits are appreciated. Skin:  Skin is warm, dry and intact. No rash noted. Psychiatric: Mood and affect are normal. Speech and behavior are normal.  ____________________________________________   LABS (all labs ordered are listed, but only abnormal results are displayed)  Labs Reviewed  COMPREHENSIVE METABOLIC PANEL - Abnormal; Notable for the following components:      Result Value   Potassium 5.4 (*)    Creatinine, Ser 1.63 (*)    Albumin 3.2 (*)    GFR, Estimated 32 (*)    Anion gap 3 (*)    All other components within normal limits  URINALYSIS, ROUTINE W REFLEX MICROSCOPIC - Abnormal; Notable for the following components:   Hgb urine dipstick TRACE (*)    Protein, ur TRACE (*)    Leukocytes,Ua LARGE (*)    All other components within normal limits  URINALYSIS, MICROSCOPIC (REFLEX) - Abnormal; Notable for the following components:   Non Squamous Epithelial PRESENT (*)    All other components within normal limits  RESP PANEL BY RT-PCR (FLU A&B, COVID) ARPGX2  URINE CULTURE  LIPASE, BLOOD  CBC WITH DIFFERENTIAL/PLATELET  CBC WITH DIFFERENTIAL/PLATELET  TROPONIN I (HIGH SENSITIVITY)  TROPONIN I (HIGH SENSITIVITY)   ____________________________________________  EKG  ED ECG REPORT I, Blake Divine, the attending physician, personally viewed and interpreted this ECG.   Date: 01/12/2021  EKG Time: 5:33  Rate: 85  Rhythm: normal sinus rhythm  Axis: LAD  Intervals:none  ST&T Change: None   PROCEDURES  Procedure(s) performed (including Critical Care):  Procedures   ____________________________________________   INITIAL IMPRESSION / ASSESSMENT AND PLAN / ED COURSE      80 year old female with past medical history of hypertension,  diabetes, CKD, stroke, diastolic CHF, and COPD on 2 L who presents to the ED with "feeling sick" for the past 2 days with cough, shortness of breath, and abdominal pain.  She is maintaining her O2 sats on her usual 2 L nasal cannula and is not in any respiratory distress, does have some expiratory wheezing that we will treat with a DuoNeb.  EKG shows no evidence of arrhythmia or ischemia, low suspicion for cardiac etiology of her symptoms.  Symptoms seem most consistent with viral illness exacerbating her COPD.  We will hold off on steroids for now and reassess following DuoNeb.  With her abdominal pain, we will also check CT scan of her abdomen/pelvis, UA is pending.  Labs are reassuring with renal function stable from previous, potassium mildly elevated but this appears related to hemolysis.  No anemia or leukocytosis noted.  Chest x-ray reviewed by me and shows mild pulmonary vascular congestion, likely contributing to patient's difficulty breathing.  O2 sats remained stable on her usual 2 L nasal cannula with no tachypnea, we will diurese with IV Lasix.  UA is also concerning for UTI, we will send for culture and treat with Rocephin.  If CT scan of her abdomen/pelvis is unremarkable, patient would be appropriate for discharge back to nursing facility with plan to treat UTI with Keflex.  Patient turned over to oncoming provider pending CT results.      ____________________________________________   FINAL CLINICAL IMPRESSION(S) / ED DIAGNOSES  Final diagnoses:  Acute cystitis without hematuria  Shortness of breath     ED Discharge Orders          Ordered    cephALEXin (KEFLEX) 500 MG capsule  2 times daily        01/12/21 0740             Note:  This document was prepared using Dragon voice recognition software and may include unintentional dictation errors.    Blake Divine, MD 01/12/21 [phone]

## 2021-01-13 DIAGNOSIS — Z741 Need for assistance with personal care: Secondary | ICD-10-CM | POA: Diagnosis not present

## 2021-01-13 DIAGNOSIS — M6281 Muscle weakness (generalized): Secondary | ICD-10-CM | POA: Diagnosis not present

## 2021-01-13 DIAGNOSIS — J9622 Acute and chronic respiratory failure with hypercapnia: Secondary | ICD-10-CM | POA: Diagnosis not present

## 2021-01-13 LAB — URINE CULTURE: Culture: <10000 — AB

## 2021-01-14 DIAGNOSIS — N390 Urinary tract infection, site not specified: Secondary | ICD-10-CM | POA: Diagnosis not present

## 2021-01-14 DIAGNOSIS — R4189 Other symptoms and signs involving cognitive functions and awareness: Secondary | ICD-10-CM | POA: Diagnosis not present

## 2021-01-15 DIAGNOSIS — I10 Essential (primary) hypertension: Secondary | ICD-10-CM | POA: Diagnosis not present

## 2021-01-17 DIAGNOSIS — R4189 Other symptoms and signs involving cognitive functions and awareness: Secondary | ICD-10-CM | POA: Diagnosis not present

## 2021-01-17 DIAGNOSIS — R0989 Other specified symptoms and signs involving the circulatory and respiratory systems: Secondary | ICD-10-CM | POA: Diagnosis not present

## 2021-01-17 DIAGNOSIS — N390 Urinary tract infection, site not specified: Secondary | ICD-10-CM | POA: Diagnosis not present

## 2021-01-17 DIAGNOSIS — R0603 Acute respiratory distress: Secondary | ICD-10-CM | POA: Diagnosis not present

## 2021-01-17 DIAGNOSIS — R059 Cough, unspecified: Secondary | ICD-10-CM | POA: Diagnosis not present

## 2021-01-18 DIAGNOSIS — I10 Essential (primary) hypertension: Secondary | ICD-10-CM | POA: Diagnosis not present

## 2021-01-18 DIAGNOSIS — R059 Cough, unspecified: Secondary | ICD-10-CM | POA: Diagnosis not present

## 2021-01-18 DIAGNOSIS — E7849 Other hyperlipidemia: Secondary | ICD-10-CM | POA: Diagnosis not present

## 2021-01-18 DIAGNOSIS — J069 Acute upper respiratory infection, unspecified: Secondary | ICD-10-CM | POA: Diagnosis not present

## 2021-01-18 DIAGNOSIS — L304 Erythema intertrigo: Secondary | ICD-10-CM | POA: Diagnosis not present

## 2021-01-18 DIAGNOSIS — E1122 Type 2 diabetes mellitus with diabetic chronic kidney disease: Secondary | ICD-10-CM | POA: Diagnosis not present

## 2021-01-18 DIAGNOSIS — L24A2 Irritant contact dermatitis due to fecal, urinary or dual incontinence: Secondary | ICD-10-CM | POA: Diagnosis not present

## 2021-01-24 IMAGING — NM NM PULMONARY VENTILATION AND PERFUSION SCAN
2 series · 12 of 12 positions shown · non-contrast
Comparison: Chest radiograph and chest CT July 15, 2018

CLINICAL DATA: Shortness of breath

EXAM:
NUCLEAR MEDICINE VENTILATION - PERFUSION LUNG SCAN
VIEWS:
Anterior, posterior, RAO, LAO, RPO, LPO-ventilation and perfusion
RADIOPHARMACEUTICALS:  31.08 mCi of Zc-YYm DTPA aerosol inhalation
and 4.12 mCi OcEEm MAA IV

[Series 1000: ventilation · 3.90mm/px · 3 acquisitions, 6 frames shown]
[im 1/3]
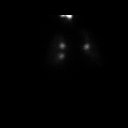
[im 1/3]
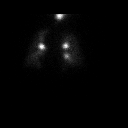
[im 2/3]
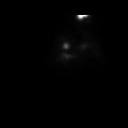
[im 2/3]
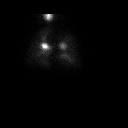
[im 3/3]
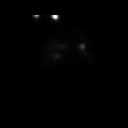
[im 3/3]
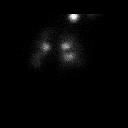

[Series 1000: perfusion · 1.95mm/px · 3 acquisitions, 6 frames shown]
[im 1/3]
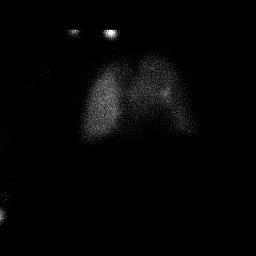
[im 1/3]
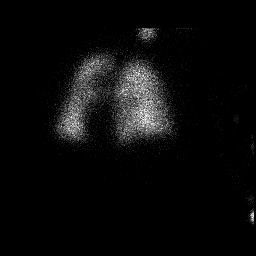
[im 2/3]
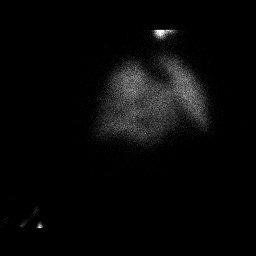
[im 2/3]
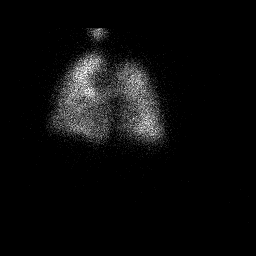
[im 3/3]
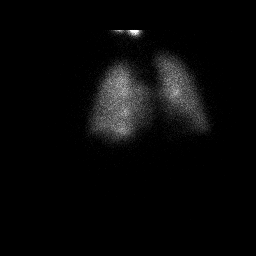
[im 3/3]
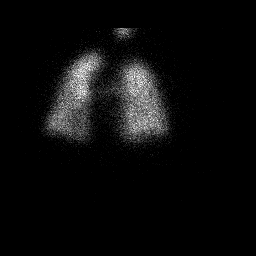

[12 of 12 positions shown; findings below may reference images not displayed]

FINDINGS: Ventilation: There is essentially homogeneous and symmetric uptake
of radiotracer bilaterally. No focal ventilation defects are
evident.

Perfusion: Radiotracer uptake is essentially homogeneous and
symmetric bilaterally. No appreciable perfusion defects evident.
IMPRESSION: No appreciable ventilation or perfusion defects. Very low
probability of pulmonary embolus.

## 2021-01-24 IMAGING — CT CT CHEST WITHOUT CONTRAST
2 of 3 series · 15 of 36 positions shown, 18 images · non-contrast
Comparison: Plain film from earlier in the same day.

CLINICAL DATA: Chest pain

EXAM:
CT CHEST WITHOUT CONTRAST
TECHNIQUE: Multidetector CT imaging of the chest was performed following the
standard protocol without IV contrast.

[Series 2: thorax · axial · 0.57mm/px · z∈[-742,-496]mm · 12 of 145 slices shown, 15 images]
[im 11/145  mediastinal]
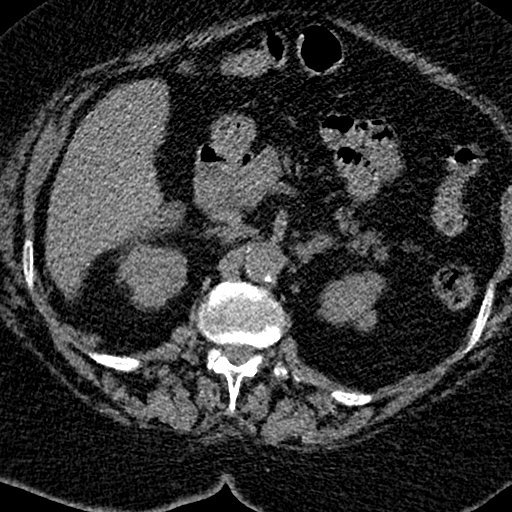
[im 11/145  lung]
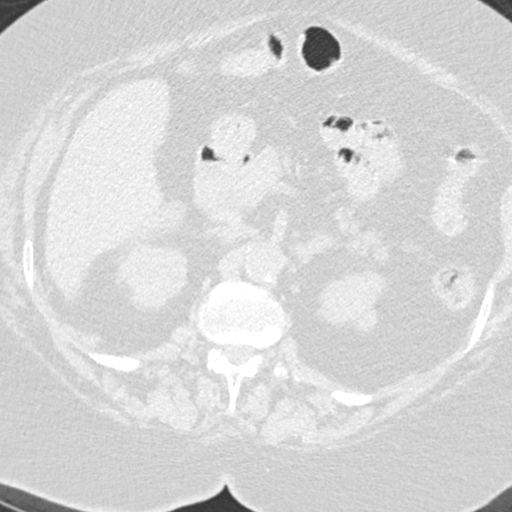
[im 22/145  lung]
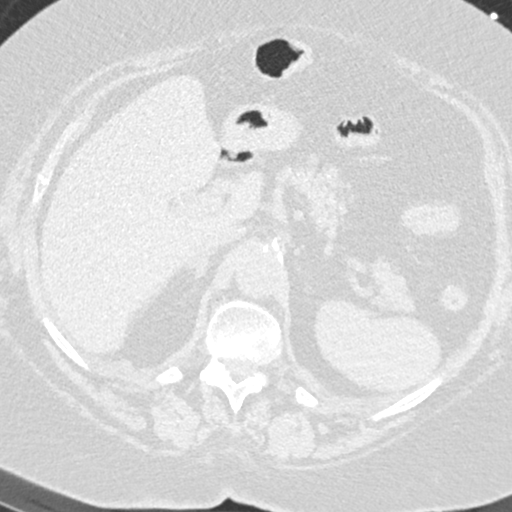
[im 33/145  lung]
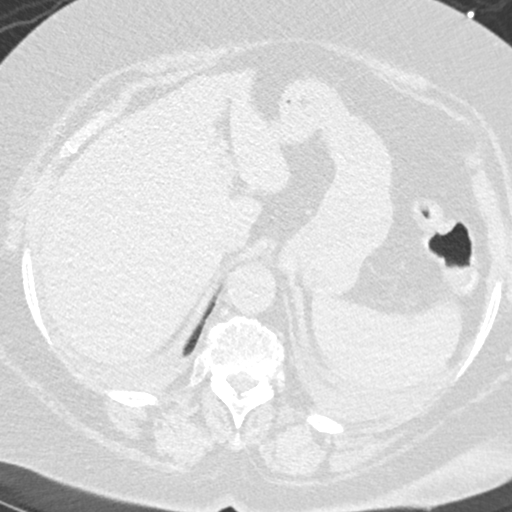
[im 43/145  lung]
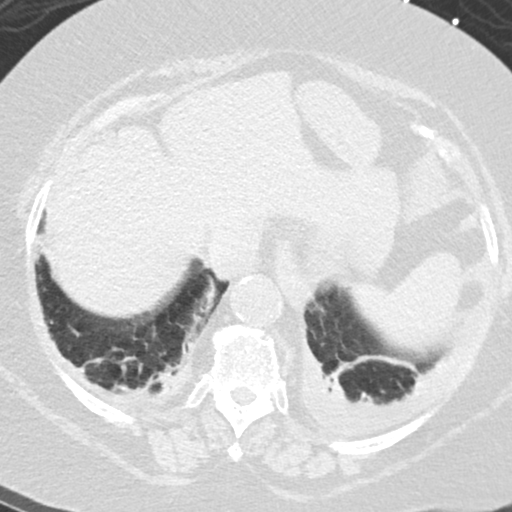
[im 54/145  mediastinal]
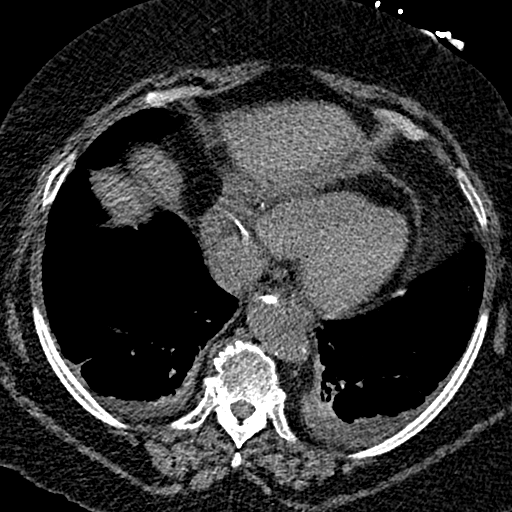
[im 54/145  lung]
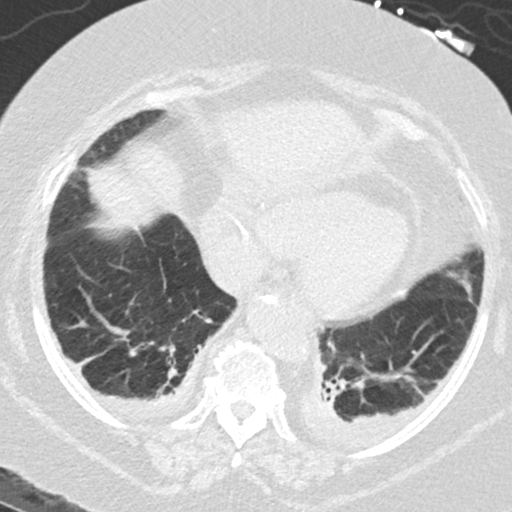
[im 65/145  lung]
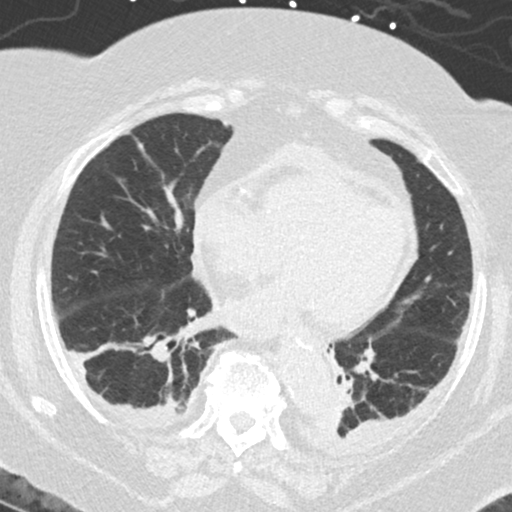
[im 81/145  lung]
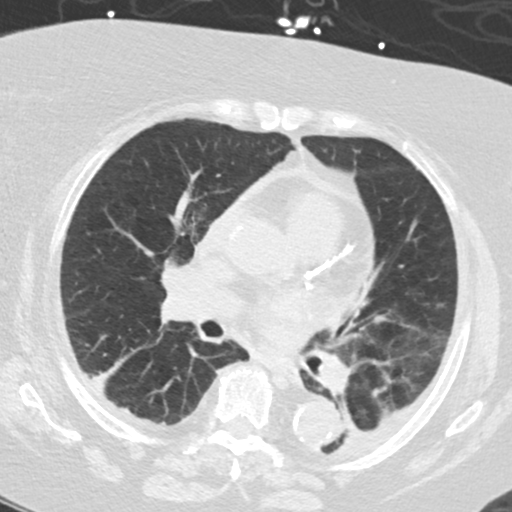
[im 91/145  lung]
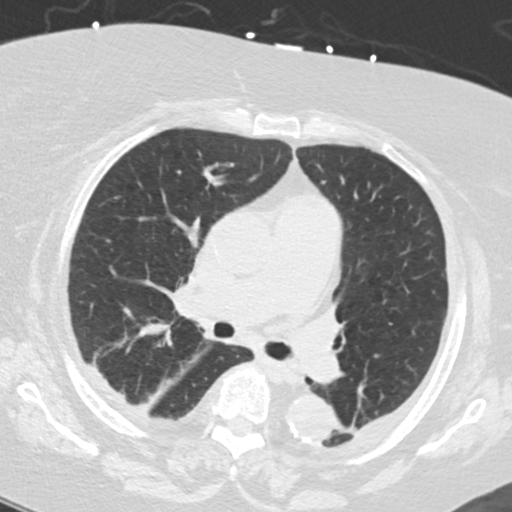
[im 102/145  mediastinal]
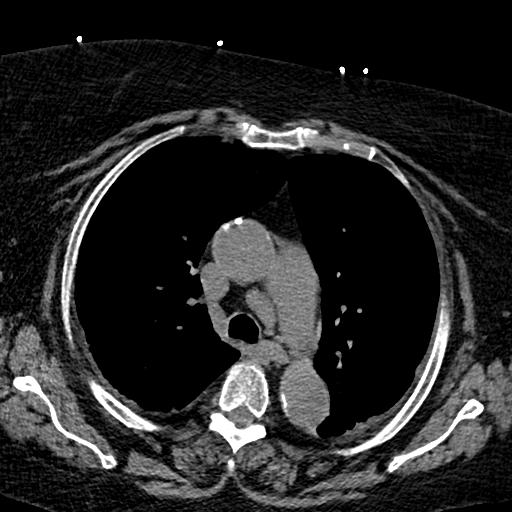
[im 102/145  lung]
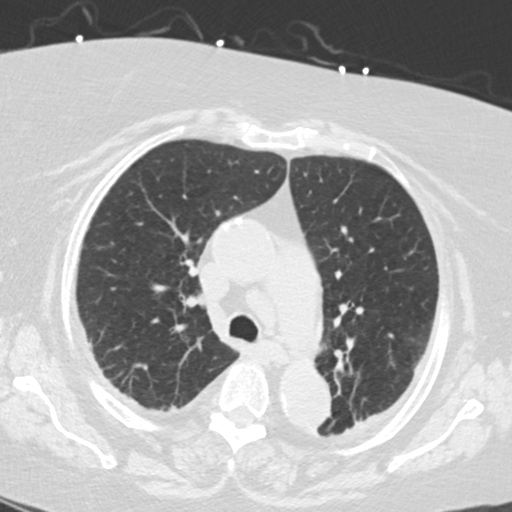
[im 113/145  lung]
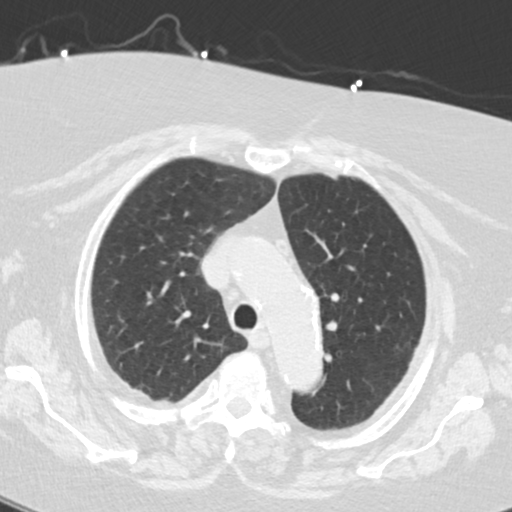
[im 123/145  lung]
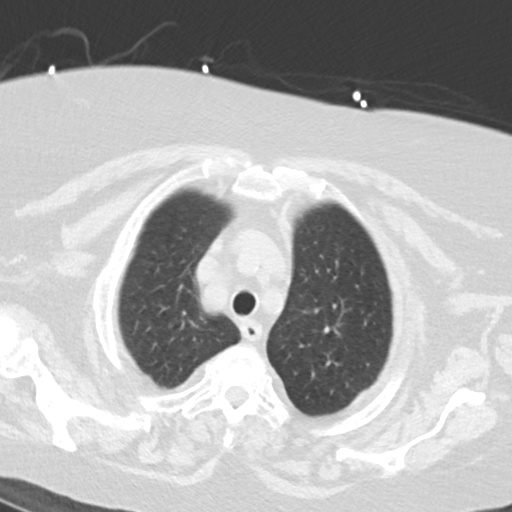
[im 134/145  lung]
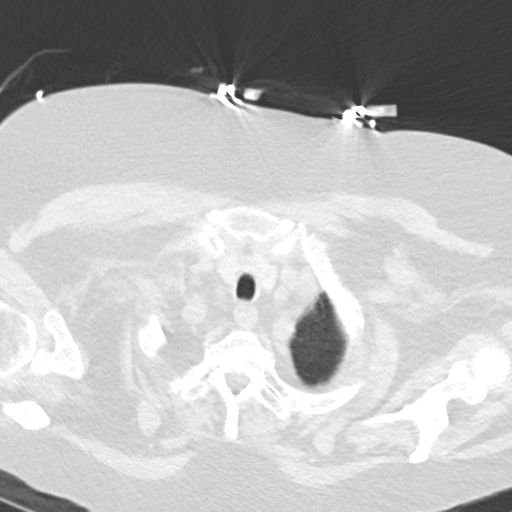

[Series 5: coronal · coronal · 0.59mm/px · 3 of 142 slices shown]
[im 29/142  lung]
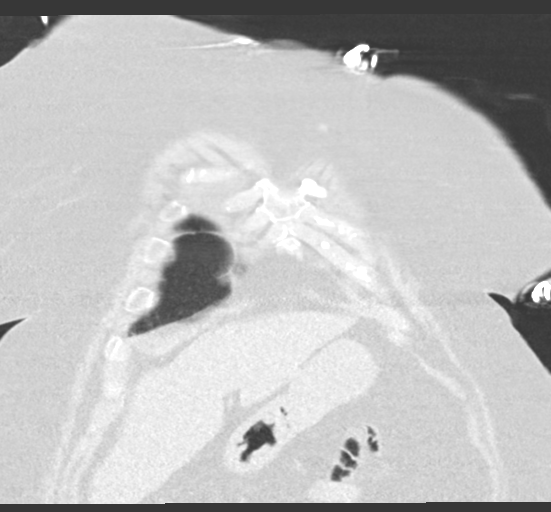
[im 57/142  lung]
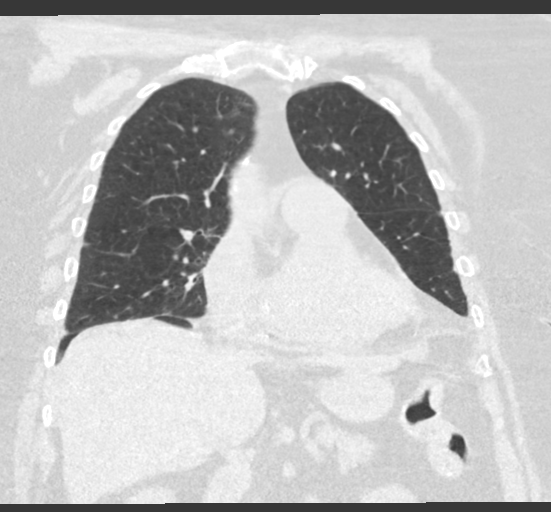
[im 85/142  lung]
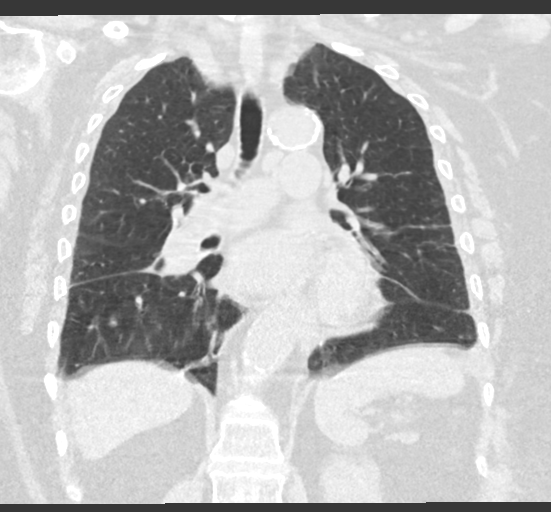

[15 of 36 positions shown; findings below may reference images not displayed]

FINDINGS: Cardiovascular: Atherosclerotic calcifications of the thoracic aorta
are noted without aneurysmal dilatation. Coronary calcifications are
seen. Very minimal pericardial effusion is noted.

Mediastinum/Nodes: Thoracic inlet is within normal limits. No hilar
or mediastinal adenopathy is noted.

Lungs/Pleura: Small bilateral pleural effusions are noted. Mild
bibasilar atelectasis is noted similar to that seen on prior plain
film examination. No sizable parenchymal nodules are seen.

Upper Abdomen: Changes consistent with prior cholecystectomy.

Musculoskeletal: Degenerative changes of the thoracic spine are
noted.
IMPRESSION: Small bilateral pleural effusions and mild bibasilar atelectasis.

Aortic Atherosclerosis ([id number]).

## 2021-01-24 IMAGING — US VENOUS DOPPLER ULTRASOUND OF  LOWER EXTREMITIES
1 series · 13 of 24 positions shown · non-contrast
Comparison: 11/21/2017

CLINICAL DATA: 77-year-old with leg pain and swelling.



[Series 1: venous doppler ultrasound of lower extremities · 13 of 60 slices shown]
[im 1/60]
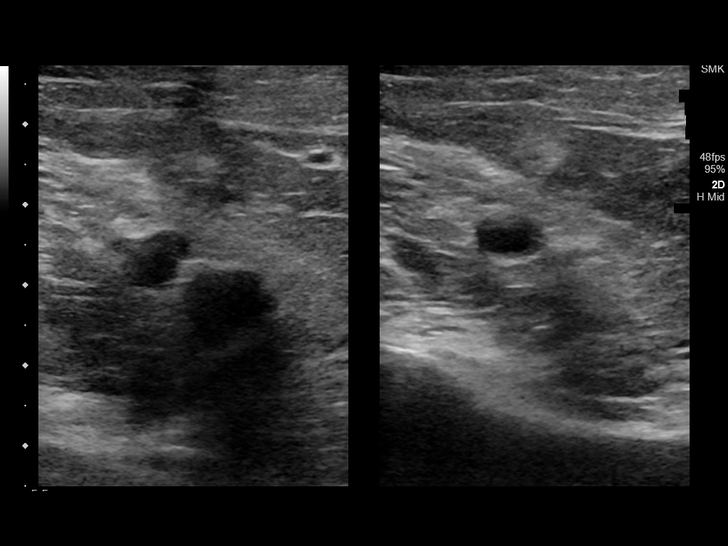
[im 6/60]
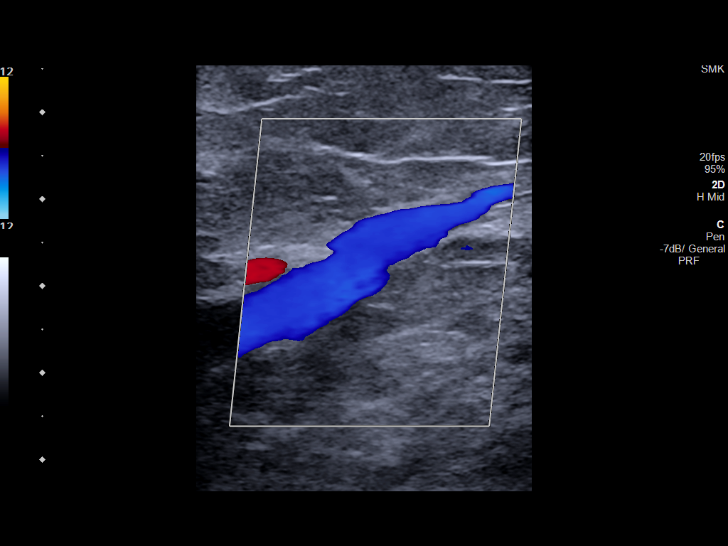
[im 11/60]
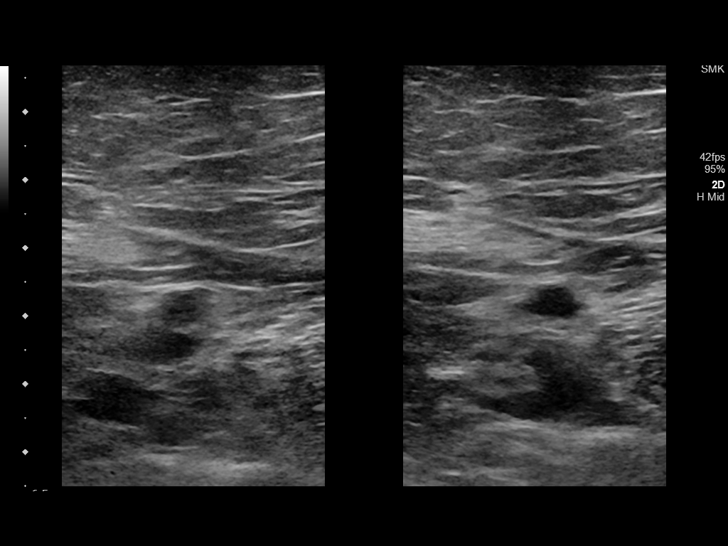
[im 16/60]
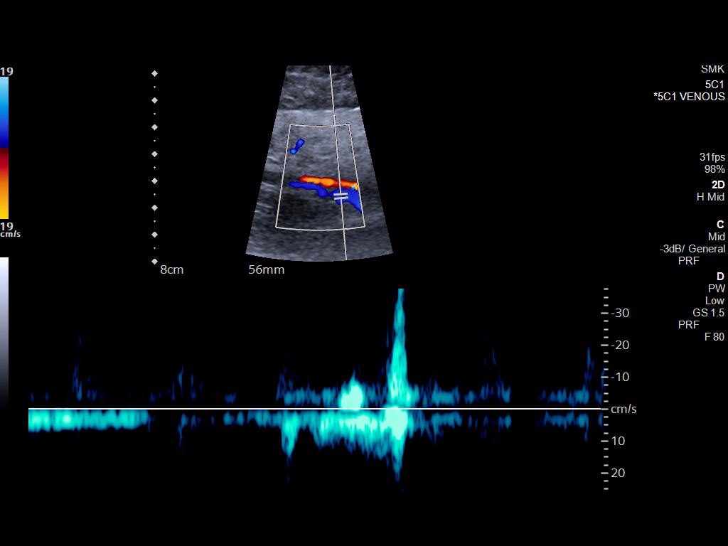
[im 21/60]
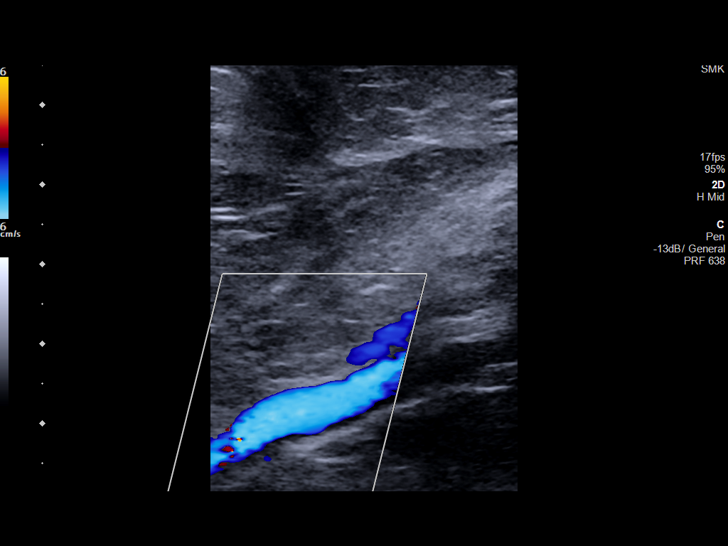
[im 26/60]
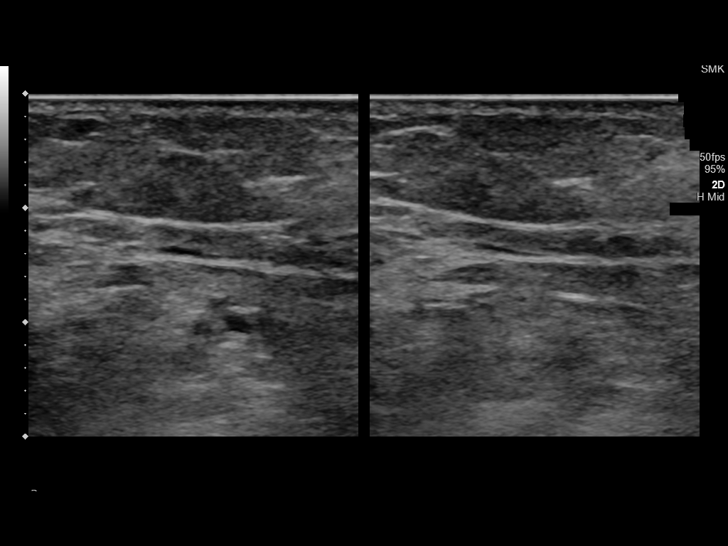
[im 31/60]
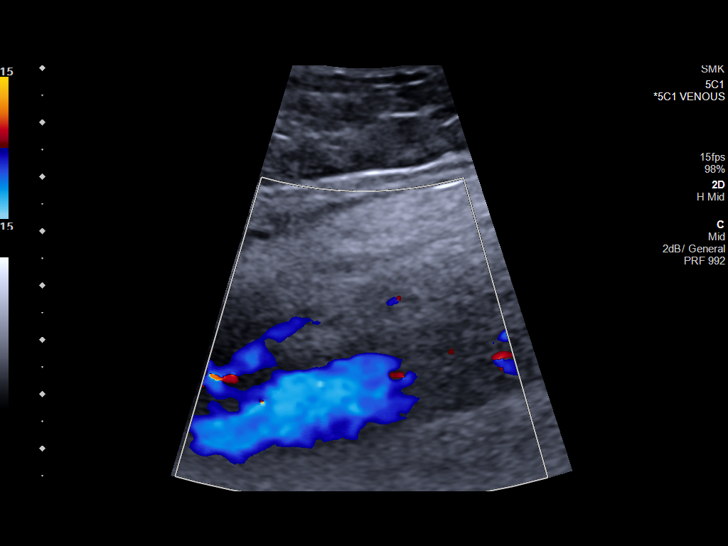
[im 34/60]
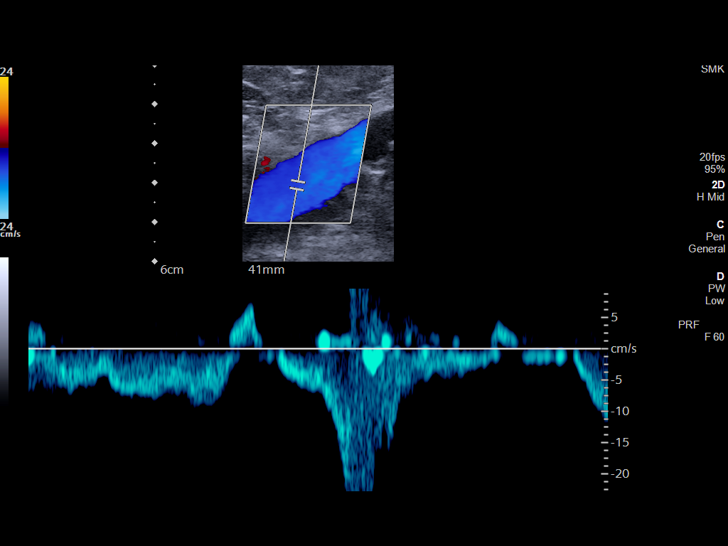
[im 39/60]
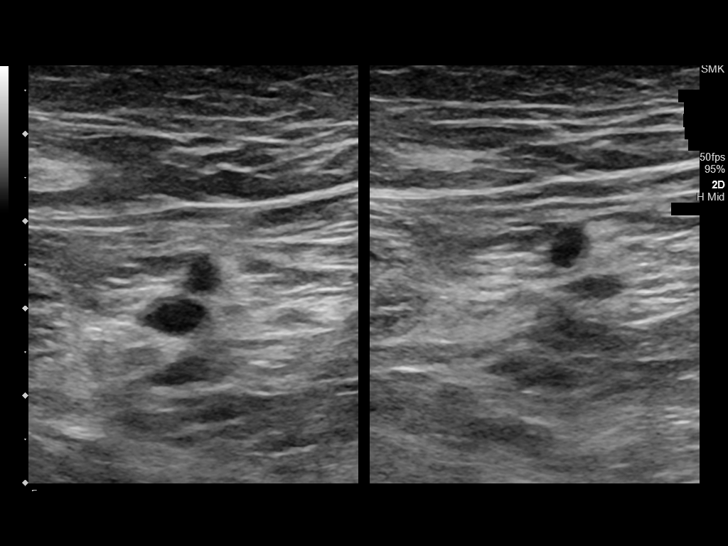
[im 44/60]
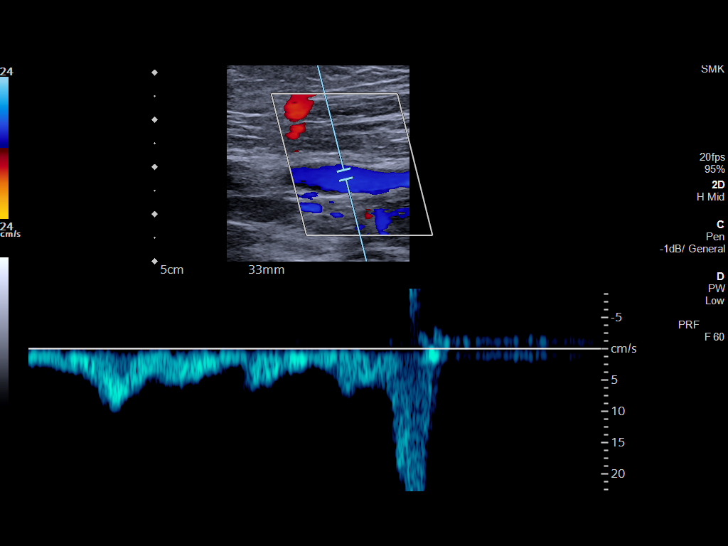
[im 49/60]
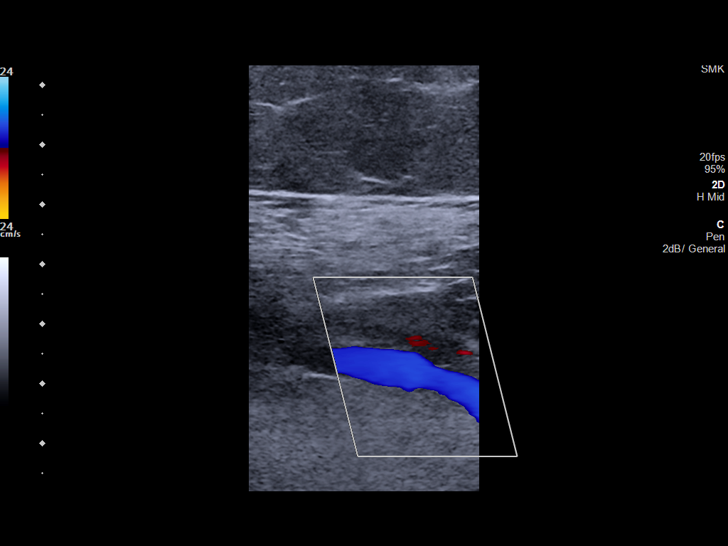
[im 54/60]
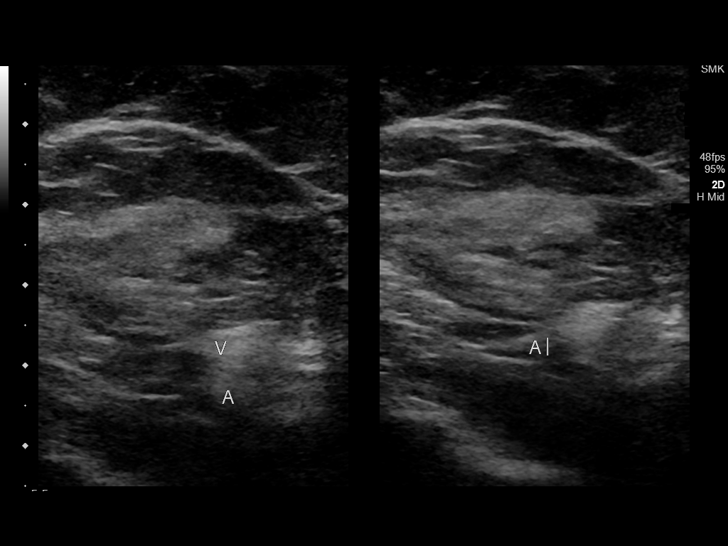
[im 60/60]
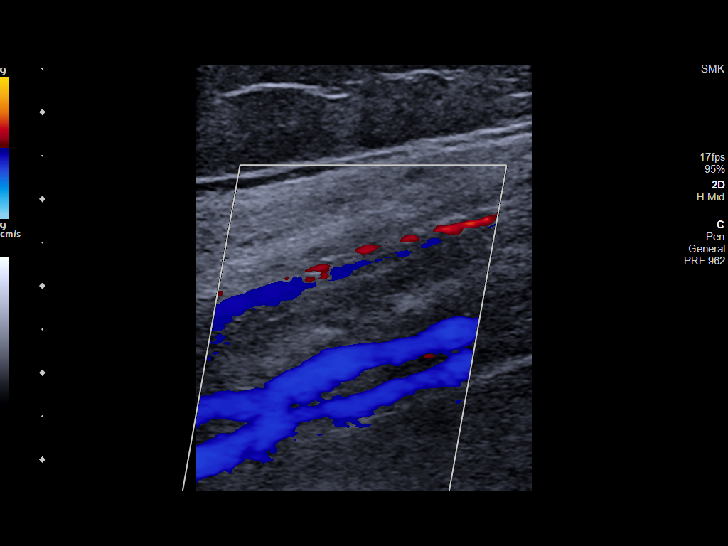

[13 of 24 positions shown; findings below may reference images not displayed]

FINDINGS: RIGHT LOWER EXTREMITY

Common Femoral Vein: No evidence of thrombus. Normal
compressibility, respiratory phasicity and response to augmentation.

Saphenofemoral Junction: No evidence of thrombus. Normal
compressibility and flow on color Doppler imaging.

Profunda Femoral Vein: No evidence of thrombus. Normal
compressibility and flow on color Doppler imaging.

Femoral Vein: No evidence of thrombus. Normal compressibility,
respiratory phasicity and response to augmentation.

Popliteal Vein: No evidence of thrombus. Normal compressibility,
respiratory phasicity and response to augmentation.

Calf Veins: No evidence of thrombus. Normal compressibility and flow
on color Doppler imaging.

Other Findings:  None.

LEFT LOWER EXTREMITY

Common Femoral Vein: No evidence of thrombus. Normal
compressibility, respiratory phasicity and response to augmentation.

Saphenofemoral Junction: No evidence of thrombus. Normal
compressibility and flow on color Doppler imaging.

Profunda Femoral Vein: No evidence of thrombus. Normal
compressibility and flow on color Doppler imaging.

Femoral Vein: No evidence of thrombus. Normal compressibility,
respiratory phasicity and response to augmentation.

Popliteal Vein: No evidence of thrombus. Normal compressibility,
respiratory phasicity and response to augmentation.

Calf Veins: No evidence of thrombus. Normal compressibility and flow
on color Doppler imaging.

Other Findings:  None.
IMPRESSION: No evidence of deep venous thrombosis in either lower extremity.

## 2021-01-24 IMAGING — DX PORTABLE CHEST - 1 VIEW
1 series · 1 of 1 positions shown · non-contrast
Comparison: 06/11/2018

CLINICAL DATA: Chest pain

EXAM:
PORTABLE CHEST 1 VIEW

[chest ap]
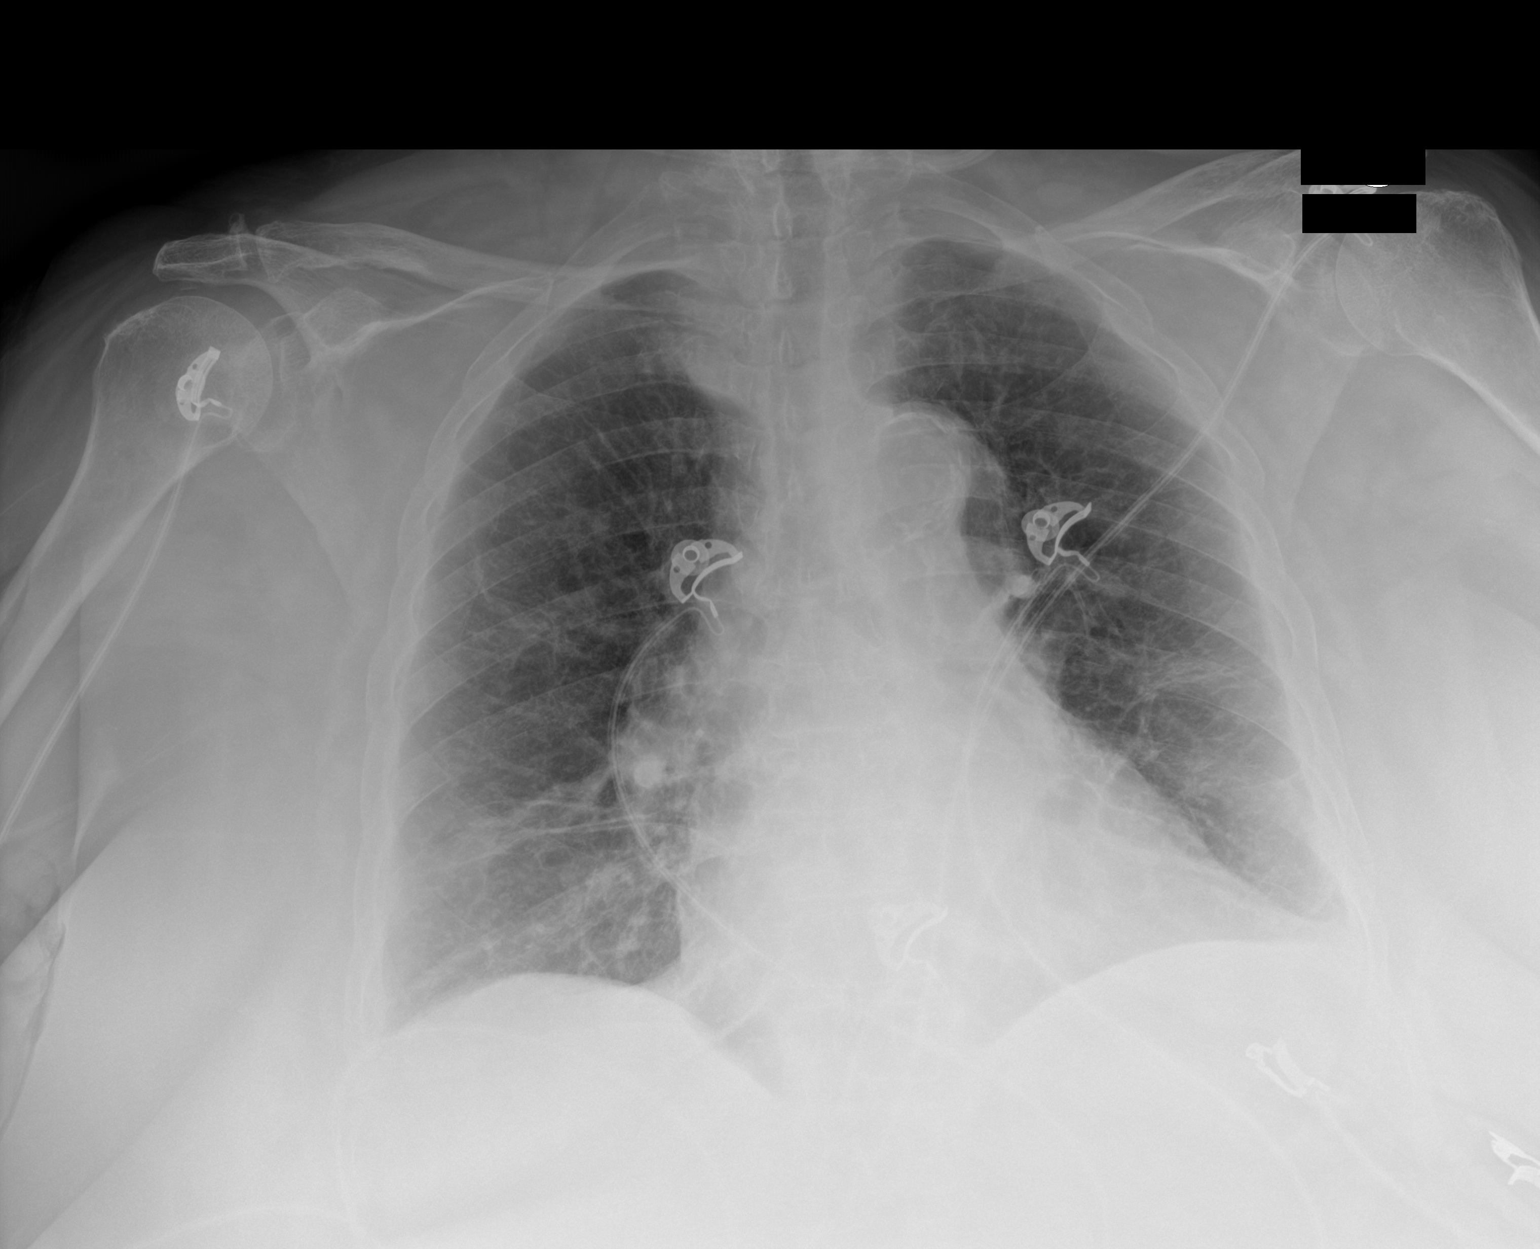

[1 of 1 positions shown; findings below may reference images not displayed]

FINDINGS: Cardiac shadow is within normal limits. Mild atelectasis is noted in
the bases bilaterally stable from the prior exam. No new focal
infiltrate or effusion is seen. No acute bony abnormality is noted.
Aortic calcifications are seen.
IMPRESSION: Stable bibasilar atelectasis.

## 2021-01-25 DIAGNOSIS — J449 Chronic obstructive pulmonary disease, unspecified: Secondary | ICD-10-CM | POA: Diagnosis not present

## 2021-01-25 DIAGNOSIS — G40909 Epilepsy, unspecified, not intractable, without status epilepticus: Secondary | ICD-10-CM | POA: Diagnosis not present

## 2021-01-25 DIAGNOSIS — E785 Hyperlipidemia, unspecified: Secondary | ICD-10-CM | POA: Diagnosis not present

## 2021-01-25 DIAGNOSIS — M1712 Unilateral primary osteoarthritis, left knee: Secondary | ICD-10-CM | POA: Diagnosis not present

## 2021-01-26 ENCOUNTER — Other Ambulatory Visit: Payer: Self-pay

## 2021-01-26 ENCOUNTER — Encounter: Payer: Self-pay | Admitting: Nurse Practitioner

## 2021-01-26 ENCOUNTER — Non-Acute Institutional Stay: Payer: Medicare HMO | Admitting: Nurse Practitioner

## 2021-01-26 VITALS — BP 136/76 | HR 82 | Temp 97.3°F | Resp 20 | Wt 239.0 lb

## 2021-01-26 DIAGNOSIS — R5381 Other malaise: Secondary | ICD-10-CM

## 2021-01-26 DIAGNOSIS — I509 Heart failure, unspecified: Secondary | ICD-10-CM | POA: Diagnosis not present

## 2021-01-26 DIAGNOSIS — R0602 Shortness of breath: Secondary | ICD-10-CM

## 2021-01-26 DIAGNOSIS — Z515 Encounter for palliative care: Secondary | ICD-10-CM

## 2021-01-26 NOTE — Progress Notes (Addendum)
[facility] Consult Note Telephone: [phone]  Fax: [phone]    Date of encounter: 01/26/21 2:45 PM PATIENT NAME: Katherine [name] [place] [id number]   [phone] (home)  DOB: [birth year] MRN: [id number] PRIMARY CARE PROVIDER:    [facility] [place] [address] [phone] RESPONSIBLE PARTY:    Contact Information     Name Relation Home Work [place] Spouse [phone]  [phone]   Katherine [name], Katherine [name] Daughter   [phone]      I met face to face with patient in facility. Palliative Care was asked to follow this patient by consultation request of  [facility] [place] to address advance care planning and complex medical decision making. This is a follow up visit.                                  ASSESSMENT AND PLAN / RECOMMENDATIONS: Symptom Management/Plan: 1. ACP: remains a full code; most form in ACP   2. Edema stable; secondary to CHF, monitor weights, elevate; discussed nutrition with Katherine [name]   3. Shortness of breath secondary to CHF, controlled;  continue diuresing, inhalation therapy; Continuous O2,  Continue daily weights; discussed with Katherine [name] pending appointment with Dr Lucky Cowboy Vascular  10/23/2020 weight 249.1 lbs 12/24/2020 weight 245.1 lbs 01/16/2021 weight 239.0 lbs BMI 42.3  01/18/2021 WBC 4.2, hemoglobin 11.5, hematocrit 35.7, platelets 151, sodium 147, potassium 4.6, chloride 103, CO2 30, calcium 10, BUN 21.9, creatinine 1.58, glucose 91  4. Debility; discussed with [name] about getting oob which she declines. Revisited with Katherine [name] updated [name] getting out of bed, explained non-compliance, if she declines to staff when they try to get her oob.    5. Palliative care encounter; Palliative medicine team will continue to support patient, patient's family, and medical team. Visit consisted of counseling and education dealing  with the complex and emotionally intense issues of symptom management and palliative care in the setting of serious and potentially life-threatening illness  Follow up Palliative Care Visit: Palliative care will continue to follow for complex medical decision making, advance care planning, and clarification of goals. Return 8 weeks or prn.  I spent 66 minutes providing this consultation starting at 10:15am. More than 50% of the time in this consultation was spent in counseling and care coordination. PPS: 30%  Chief Complaint: Follow up palliative consult for complex medical decision making  HISTORY OF PRESENT ILLNESS:  Katherine [name] is a 80 y.o. 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 [name] resides at [place] [facility]. [name] remains bed bound, require staff to assist her with turning, positioning, bathing, dressing. Ms Kuennen does feed herself after tray setup. [name] has been feeding herself per staff. Appetite has been good per staff. [name] had a ED visit 01/12/2021 for feeling sick, hypoxic with workup significant for viral illness exacerbated by COPD; abdominal pain with CT scan resulting with no acute abnormality.  Does have  stable known AAA.  Patient denies any pain or discomfort at this time.  Receiving antibiotics for UTI. [name] was returned to facility. Staff endorses no other changes or concerns. At present [name] is lying in bed, sleeping. [name] did awoke to verbal cues. Ms Aracena made eye contact. We talked about symptoms, ros, debility getting oob which [name] declined. We talked about sleep patterns. We talked about appetite, foods she likes. We talked about socialization at the facility. [name] did fall asleep during pc visit. [name] was cooperative with assessment. Emotional support provided. I did call [name], Ms. Mccarrell husband. Updated discussed and talked about PC visit. We reviewed symptoms, functional abilities, oob, appetite, nutrition, residing at facility. We talked about upcoming appointment with Dr Lucky Cowboy, Vascular. We talked about medical goals, PC in goc. Will continue to follow, monitor next visit in 8 weeks. I updated staff. Support provided to Katherine [name].  History obtained from review of EMR, discussion with facility staff and [name] in person and Katherine [name] per phone I reviewed available labs, medications, imaging, studies and related documents from the EMR.  Records reviewed and summarized above.   ROS Full 10 system review of systems performed and negative with exception of: as per HPI.   Physical Exam: Constitutional: NAD General: obese, debilitated, chronically ill female EYES: lids intact ENMT: oral mucous membranes moist CV: S1S2, RRR, +BLE edema Pulmonary: clear, decrease bases, no increased work of breathing, no cough Abdomen: normo-active BS + 4 quadrants, soft and non tender MSK: bed-bound Skin: warm and dry Neuro:  + generalized weakness,  + cognitive impairment Psych: non-anxious affect, A and O x 2 Thank you for the opportunity to participate in the care of [name].  The palliative care team will continue to follow. Please call our office at [phone] if we can be of additional assistance.   Questions and concerns were addressed. The patient/family was encouraged to call with questions and/or concerns. My business card was provided.  Provided general support and encouragement, no other unmet needs identified   This chart was dictated using voice recognition software.  Despite best efforts to proofread,  errors can occur which can change the documentation meaning.   Cate Oravec Ihor Gully, NP

## 2021-02-01 ENCOUNTER — Ambulatory Visit (INDEPENDENT_AMBULATORY_CARE_PROVIDER_SITE_OTHER): Payer: Medicare HMO | Admitting: Vascular Surgery

## 2021-02-01 ENCOUNTER — Encounter (INDEPENDENT_AMBULATORY_CARE_PROVIDER_SITE_OTHER): Payer: Self-pay | Admitting: Vascular Surgery

## 2021-02-01 ENCOUNTER — Other Ambulatory Visit: Payer: Self-pay

## 2021-02-01 VITALS — BP 159/99 | HR 73

## 2021-02-01 DIAGNOSIS — I7143 Infrarenal abdominal aortic aneurysm, without rupture: Secondary | ICD-10-CM

## 2021-02-01 DIAGNOSIS — N184 Chronic kidney disease, stage 4 (severe): Secondary | ICD-10-CM

## 2021-02-01 DIAGNOSIS — E1122 Type 2 diabetes mellitus with diabetic chronic kidney disease: Secondary | ICD-10-CM

## 2021-02-01 DIAGNOSIS — I639 Cerebral infarction, unspecified: Secondary | ICD-10-CM | POA: Diagnosis not present

## 2021-02-01 DIAGNOSIS — I10 Essential (primary) hypertension: Secondary | ICD-10-CM | POA: Diagnosis not present

## 2021-02-01 NOTE — Assessment & Plan Note (Signed)
Seems fairly debilitated from this

## 2021-02-01 NOTE — Assessment & Plan Note (Signed)
blood pressure control important in reducing the progression of atherosclerotic disease and aneurysmal growth. On appropriate oral medications.  

## 2021-02-01 NOTE — Assessment & Plan Note (Signed)
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 We discussed with she and her family members that the size, the risk of rupture is less than 1 %/year we would not generally recommend repair even for a fit and healthy individual.  With her extensive comorbidities and limited functional status, we would potentially entertain repair at the normal 5.0 cm, but we may allow this to go up to a larger size such as 5.5 cm before having this discussion on potential repair.  She does appear to have anatomy that would be suitable for endovascular repair.  We discussed the pathophysiology and natural history of abdominal aortic aneurysm.  They voiced their understanding.

## 2021-02-01 NOTE — Assessment & Plan Note (Signed)
blood glucose control important in reducing the progression of atherosclerotic disease. Also, involved in wound healing. On appropriate medications.  

## 2021-02-01 NOTE — Patient Instructions (Signed)
Abdominal Aortic Aneurysm  An aneurysm is a bulge in a blood vessel that carries blood away from the heart (artery). It happens when blood pushes against a weak or damaged place on the wall of the blood vessel. An abdominal aortic aneurysm happens in the main blood vessel that carries blood away from the heart (aorta). Most aneurysms do not cause problems, but some do cause problems. If an aneurysm grows, it can burst or tear. This causes bleeding inside you. It is anemergency. It can be life-threatening. What are the causes? The exact cause of this condition is not known. What increases the risk? The following factors may make you more likely to develop this condition: Being female and 60 years of age or older. Being of [place] descent. Using nicotine or tobacco now or in the past. Having a family history of aneurysms. Having any of these problems: Hardening of blood vessels that carry blood away from the heart. Irritation and swelling of the walls of blood vessels that carry blood away from the heart. Certain genetic problems. Being very overweight. An infection in the wall of your aorta. High cholesterol. High blood pressure (hypertension). What are the signs or symptoms? Symptoms depend on the size of your aneurysm and how fast it is growing. Most aneurysms grow slowly and do not cause symptoms. If symptoms happen, you may: Have very bad pain in your belly (abdomen), side, or low back. Feel full after eating only a little food. Feel a throbbing lump in your belly. Have these problems with your feet or toes: Pain. Skin turning blue. Sores. Have trouble pooping (constipation). Have trouble peeing (urinating). If your aneurysm bursts, you may: Feel sudden, very bad pain in the belly, side, or back. Feel like you may vomit. Vomit. Feel light-headed. Faint. How is this treated? Treatment for this condition depends on: The size of your aneurysm. How fast it is  growing. Your age. Your risk of having the aneurysm burst. If your aneurysm is smaller than 2 inches (5 cm), your doctor may: Check it often to see if it is growing. You may have an imaging test (ultrasound) to check it every 3-6 months, every year, or every few years. Give you medicines for: High blood pressure. Pain. Infection. If your aneurysm is larger than 2 inches (5 cm), you may need surgery to fix it. Follow these instructions at home: Eating and drinking  Eat a heart-healthy diet. Eat a lot of: Fresh fruits and vegetables. Whole grains. Low-fat (lean) protein. Low-fat dairy products. Avoid foods that are high in saturated fat and cholesterol. These foods include red meat and some dairy products.  Lifestyle     Do not use any products that contain nicotine or tobacco, such as cigarettes, e-cigarettes, and chewing tobacco. If you need help quitting, ask your doctor. Stay active and get exercise. Ask your doctor how often to exercise and what types of exercise are safe for you. Keep a healthy weight. Alcohol use Do not drink alcohol if: Your doctor tells you not to drink. You are pregnant, may be pregnant, or are planning to become pregnant. If you drink alcohol: Limit how much you use to: 0-1 drink a day for women. 0-2 drinks a day for men. Be aware of how much alcohol is in your drink. In the [place] one drink equals one 12 oz bottle of beer (355 mL), one 5 oz glass of wine (148 mL), or one 1 oz glass of hard liquor (44 mL). General instructions   Take over-the-counter and prescription medicines only as told by your doctor. Keep your blood pressure in a normal range. Check it at regular times. Ask your doctor what level it should be. Have regular checks of your levels of blood sugar (glucose) and cholesterol. Follow steps to keep these levels near normal. Avoid heavy lifting and activities that take a lot of effort. Ask what activities are safe for you. If you can, learn  your family's health history. Keep all follow-up visits as told by your doctor. This is important. Contact a doctor if: Your belly, side, or back hurts. Your belly throbs. You have a fever. Get help right away if: You have sudden, bad pain in your belly, side, or back. You feel like you may vomit or you vomit. You feel light-headed or you faint. Your heart beats fast when you stand. You have sweaty skin that is cold to the touch (clammy). You are short of breath. You have trouble pooping. You have trouble peeing. These symptoms may be an emergency. Do not wait to see if the symptoms will go away. Get medical help right away. Call your local emergency services (911 in the [place] Do not drive yourself to the hospital. Summary An aneurysm is a bulge in one of the blood vessels that carry blood away from the heart (artery). An abdominal aortic aneurysm happens in the main blood vessel that carries blood away from the heart (aorta). This condition can cause bleeding inside the body. It can be life-threatening. Risk can rise if you are female, age 60 or older, and of [place] descent. Risk can also rise from nicotine or tobacco use or having aneurysms in the family. Get help right away if you have symptoms of a burst aneurysm. This information is not intended to replace advice given to you by your health care provider. Make sure you discuss any questions you have with your healthcare provider. Document Revised: 11/15/2018 Document Reviewed: 11/15/2018 Elsevier Patient Education  2022 [employer]  

## 2021-02-01 NOTE — Progress Notes (Signed)
Patient ID: Katherine [name], female   DOB: 1941/02/04, 80 y.o.   MRN: [id number]  Chief Complaint  Patient presents with   New Patient (Initial Visit)    Np/Consult AAA CT done in hosp    HPI [name] is a 80 y.o. female.  I am asked to see the patient by Dr. Quentin Cornwall for evaluation of a AAA seen on a recent CT scan that I have independently reviewed.  The patient is an extremely poor historian and really cannot give me much history.  Her family here with her today does not provide much history either.  This is obtained largely from previous medical records and my review of her CT scan.  She has no abdominal pain or tenderness.  No back pain or signs of peripheral embolization.  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  She does not have any rest pain or ulceration of the lower extremities.  She is wheelchair-bound and really does not walk much at this point.  She has a litany of medical issues and is on continuous oxygen therapy at current   Past Medical History:  Diagnosis Date   Angioedema    Anxiety and depression    CHF (congestive heart failure) (HCC)    Chronic constipation    Chronic kidney disease    stage 3   COPD (chronic obstructive pulmonary disease) (HCC)    Diabetes mellitus without complication (HCC)    Gallstones    GERD (gastroesophageal reflux disease)    Hypertension    Left ventricular hypertrophy    Osteoarthritis    Prurigo nodularis    Seizures ([place])    Stroke ([place])    Tobacco abuse    Vitamin D deficiency disease     Past Surgical History:  Procedure Laterality Date   CHOLECYSTECTOMY     POLYPECTOMY  11/2011   vocal cord   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   Procedure: TOTAL KNEE ARTHROPLASTY;  Surgeon: Hessie Knows, MD;  Location: [facility] ORS;  Service: Orthopedics;  Laterality: Right;     Family History  Problem Relation Age of Onset    Alcohol abuse Mother    Sickle cell anemia Daughter    Hypertension Son    Cancer Neg Hx    COPD Neg Hx    Diabetes Neg Hx    Heart disease Neg Hx    Stroke Neg Hx       Social History   Tobacco Use   Smoking status: Former    Packs/day: 0.50    Years: 60.00    Pack years: 30.00    Types: Cigarettes   Smokeless tobacco: Never   Tobacco comments:    quite 37mo ago-03/26/18  Vaping Use   Vaping Use: Never used  Substance Use Topics   Alcohol use: No    Alcohol/week: 0.0 standard drinks    Comment: rare   Drug use: No     Allergies  Allergen Reactions   Bee Venom Swelling   Enalapril Maleate Swelling    Current Outpatient Medications  Medication Sig Dispense Refill   acetaminophen (TYLENOL) 650 MG CR tablet Take 650 mg by mouth 3 (three) times daily.     albuterol (ACCUNEB) 0.63 MG/3ML nebulizer solution Take 3 mLs by nebulization every 8 (eight) hours as needed for wheezing.      atorvastatin (LIPITOR) 80 MG tablet Take 80 mg by mouth every  evening.      cetirizine (ZYRTEC) 10 MG tablet Take 10 mg by mouth daily.      docusate sodium (COLACE) 100 MG capsule Take 100 mg by mouth 2 (two) times daily.     ferrous sulfate 325 (65 FE) MG tablet Take 325 mg by mouth 2 (two) times daily.     Fluticasone-Umeclidin-Vilant 100-62.5-25 MCG/INH AEPB Inhale 1 puff into the lungs daily.     gabapentin (NEURONTIN) 300 MG capsule Take 300 mg by mouth at bedtime.     ipratropium-albuterol (DUONEB) 0.5-2.5 (3) MG/3ML SOLN Take 3 mLs by nebulization 3 (three) times daily. 250 mL 0   lacosamide 100 MG TABS Take 1 tablet (100 mg total) by mouth 2 (two) times daily. 60 tablet    levETIRAcetam (KEPPRA) 750 MG tablet Take 1 tablet (750 mg total) by mouth 2 (two) times daily.     lidocaine (LIDODERM) 5 % 1 patch daily.     linagliptin (TRADJENTA) 5 MG TABS tablet Take 5 mg by mouth daily.     LORazepam (ATIVAN) 2 MG/ML injection Inject 0.5 mg into the vein daily as needed for seizure.      Menthol, Topical Analgesic, (BIOFREEZE) 4 % GEL Apply 1 application topically in the morning and at bedtime. To both knees      mupirocin ointment (BACTROBAN) 2 % Place 1 application into the nose 2 (two) times daily. 22 g 0   nitroGLYCERIN (NITROSTAT) 0.4 MG SL tablet Place 0.4 mg under the tongue every 5 (five) minutes as needed for chest pain.      pantoprazole (PROTONIX) 40 MG tablet Take 1 tablet (40 mg total) by mouth daily. 60 tablet 0   Saccharomyces boulardii (PROBIOTIC) 250 MG CAPS Take 1 capsule by mouth 2 times daily at 12 noon and 4 pm. (Patient taking differently: Take 1 capsule by mouth 2 (two) times daily.) 60 capsule 0   Saline GEL Place 3 sprays into both nostrils 3 (three) times daily.      sertraline (ZOLOFT) 25 MG tablet Take 25 mg by mouth daily.     vitamin C (VITAMIN C) 500 MG tablet Take 1 tablet (500 mg total) by mouth daily. 30 tablet 0   zinc sulfate 220 (50 Zn) MG capsule Take 1 capsule (220 mg total) by mouth daily. 30 capsule 0   No current facility-administered medications for this visit.      REVIEW OF SYSTEMS (Negative unless checked)  Constitutional: [] Weight loss  [] Fever  [] Chills Cardiac: [] Chest pain   [] Chest pressure   [] Palpitations   [] Shortness of breath when laying flat   [x] Shortness of breath at rest   [x] Shortness of breath with exertion. Vascular:  [] Pain in legs with walking   [] Pain in legs at rest   [] Pain in legs when laying flat   [] Claudication   [] Pain in feet when walking  [] Pain in feet at rest  [] Pain in feet when laying flat   [] History of DVT   [] Phlebitis   [x] Swelling in legs   [] Varicose veins   [] Non-healing ulcers Pulmonary:   [x] Uses home oxygen   [] Productive cough   [] Hemoptysis   [] Wheeze  [x] COPD   [] Asthma Neurologic:  [] Dizziness  [] Blackouts   [x] Seizures   [x] History of stroke   [] History of TIA  [] Aphasia   [] Temporary blindness   [] Dysphagia   [] Weakness or numbness in arms   [] Weakness or numbness in  legs Musculoskeletal:  [x] Arthritis   [] Joint swelling   [] Joint  pain   [] Low back pain Hematologic:  [] Easy bruising  [] Easy bleeding   [] Hypercoagulable state   [] Anemic  [] Hepatitis Gastrointestinal:  [] Blood in stool   [] Vomiting blood  [x] Gastroesophageal reflux/heartburn   [] Abdominal pain Genitourinary:  [x] Chronic kidney disease   [] Difficult urination  [] Frequent urination  [] Burning with urination   [] Hematuria Skin:  [] Rashes   [] Ulcers   [] Wounds Psychological:  [] History of anxiety   []  History of major depression.    Physical Exam BP (!) 159/99    Pulse 73  Gen:  WD/WN, NAD Head: [place]/AT, No temporalis wasting.  Ear/Nose/Throat: Hearing grossly intact, nares w/o erythema or drainage, oropharynx w/o Erythema/Exudate Eyes: Conjunctiva clear, sclera non-icteric  Neck: trachea midline.  No JVD.  Pulmonary:  Good air movement, respirations not labored, no use of accessory muscles  Cardiac: irregular Vascular:  Vessel Right Left  Radial Palpable Palpable                          DP 1+ 1+  PT NP NP   Gastrointestinal:. No masses, surgical incisions, or scars. Musculoskeletal: M/S 5/5 throughout.  Extremities without ischemic changes.  No deformity or atrophy. 1-2+ BLE edema. In a wheelchair Neurologic: speech is confused and difficult to discern, hard to assess strength and sensation. Psychiatric: Judgment and insight are poor.  Poor historian Dermatologic: No rashes or ulcers noted.  No cellulitis or open wounds.    Radiology CT Abdomen Pelvis W Contrast  Result Date: 01/12/2021 CLINICAL DATA:  Abdominal pain EXAM: CT ABDOMEN AND PELVIS WITH CONTRAST TECHNIQUE: Multidetector CT imaging of the abdomen and pelvis was performed using the standard protocol following bolus administration of intravenous contrast. CONTRAST:  61mL OMNIPAQUE IOHEXOL 300 MG/ML  SOLN COMPARISON:  06/19/2019 FINDINGS: Lower chest: Linear areas of scarring identified within the imaged portions of  the lung bases. Hepatobiliary: No focal liver abnormality is seen. Status post cholecystectomy. No biliary dilatation. Pancreas: Unremarkable. No pancreatic ductal dilatation or surrounding inflammatory changes. Spleen: Normal in size without focal abnormality. Adrenals/Urinary Tract: Left adrenal nodule containing calcification measures 1.4 cm and is unchanged from 06/19/2019, image 27/2. Right adrenal nodule measures 1.3 cm, image 25/2. Also unchanged. Nonobstructing calculus is identified within the inferior pole of the right kidney measuring 2-3 mm, image 39/2. 1.5 cm exophytic cyst arising off the posterior cortex of the upper pole of left kidney has a uniform appearance measuring 28 Hounsfield units, compatible with a Bosniak class 2 cyst, image 25/2. No hydronephrosis identified bilaterally. Urinary bladder is unremarkable. Stomach/Bowel: Small hiatal hernia. The appendix is visualized and appears normal. No bowel wall thickening, inflammation, or distension. Moderate retained stool identified within the rectum. Vascular/Lymphatic: Aortic atherosclerosis. Infrarenal abdominal aortic aneurysm is identified this has a maximum AP diameter of 4.6 cm, image 44/2. Formally this was measured at 4.3 cm. There is extensive eccentric partially calcified mural thrombus associated with the aneurysm. No abdominopelvic adenopathy. Reproductive: Uterus and bilateral adnexa are unremarkable. Other: No free fluid or fluid collections. Musculoskeletal: Mild multilevel degenerative disc disease within the thoracolumbar spine. IMPRESSION: 1. No acute findings identified within the abdomen or pelvis. 2. 4.6 cm infrarenal abdominal aortic aneurysm. Recommend follow-up every 6 months and vascular consultation. Reference: J Am Coll Radiol 5732;20:254-270. 3. Nonobstructing right renal calculus. 4. Bilateral adrenal nodules are unchanged from previous exam and favored to represent benign adenomas. 5. Benign Bosniak class 2 cyst  arises off the posterior cortex of the left kidney. 6. Aortic Atherosclerosis (  ICD10-I70.0). Electronically Signed   By: Kerby Moors M.D.   On: 01/12/2021 07:30   DG Chest Port 1 View  Result Date: 01/12/2021 CLINICAL DATA:  Pain. EXAM: PORTABLE CHEST 1 VIEW COMPARISON:  [facility] 1421 FINDINGS: 0552 hours. Low volume film. The cardio pericardial silhouette is enlarged. There is pulmonary vascular congestion without overt pulmonary edema. The visualized bony structures of the thorax show no acute abnormality. Similar appearance basilar chronic atelectasis or scarring. Telemetry leads overlie the chest. IMPRESSION: Enlargement of the cardiopericardial silhouette with vascular congestion. Electronically Signed   By: Misty Stanley M.D.   On: 01/12/2021 06:12    Labs Recent Results (from the past 2160 hour(s))  Comprehensive metabolic panel     Status: Abnormal   Collection Time: 01/12/21  5:36 AM  Result Value Ref Range   Sodium 140 135 - 145 mmol/L   Potassium 5.4 (H) 3.5 - 5.1 mmol/L    Comment: HEMOLYSIS AT THIS LEVEL MAY AFFECT RESULT   Chloride 106 98 - 111 mmol/L   CO2 31 22 - 32 mmol/L   Glucose, Bld 92 70 - 99 mg/dL    Comment: Glucose reference range applies only to samples taken after fasting for at least 8 hours.   BUN 20 8 - 23 mg/dL   Creatinine, Ser 1.63 (H) 0.44 - 1.00 mg/dL   Calcium 8.9 8.9 - 10.3 mg/dL   Total Protein 7.0 6.5 - 8.1 g/dL   Albumin 3.2 (L) 3.5 - 5.0 g/dL   AST 18 15 - 41 U/L    Comment: HEMOLYSIS AT THIS LEVEL MAY AFFECT RESULT   ALT 8 0 - 44 U/L   Alkaline Phosphatase 96 38 - 126 U/L   Total Bilirubin 1.1 0.3 - 1.2 mg/dL    Comment: HEMOLYSIS AT THIS LEVEL MAY AFFECT RESULT   GFR, Estimated 32 (L) >60 mL/min    Comment: (NOTE) Calculated using the CKD-EPI Creatinine Equation (2021)    Anion gap 3 (L) 5 - 15    Comment: Performed at [facility], [address] [place], [place] [address]  Lipase, blood     Status: None   Collection Time:  01/12/21  5:36 AM  Result Value Ref Range   Lipase 40 11 - 51 U/L    Comment: Performed at [facility], [place], [place] [address]  Troponin I (High Sensitivity)     Status: None   Collection Time: 01/12/21  5:36 AM  Result Value Ref Range   Troponin I (High Sensitivity) 7 <18 ng/L    Comment: (NOTE) Elevated high sensitivity troponin I (hsTnI) values and significant  changes across serial measurements may suggest ACS but many other  chronic and acute conditions are known to elevate hsTnI results.  Refer to the "Links" section for chest pain algorithms and additional  guidance. Performed at [facility], [place], [place] [address]   Resp Panel by RT-PCR (Flu A&B, Covid) Nasopharyngeal Swab     Status: None   Collection Time: 01/12/21  5:36 AM   Specimen: Nasopharyngeal Swab; Nasopharyngeal(NP) swabs in vial transport medium  Result Value Ref Range   SARS Coronavirus 2 by RT PCR NEGATIVE NEGATIVE    Comment: (NOTE) SARS-CoV-2 target nucleic acids are NOT DETECTED.  The SARS-CoV-2 RNA is generally detectable in upper respiratory specimens during the acute phase of infection. The lowest concentration of SARS-CoV-2 viral copies this assay can detect is 138 copies/mL. A negative result does not preclude  SARS-Cov-2 infection and should not be used as the sole basis for treatment or other patient management decisions. A negative result may occur with  improper specimen collection/handling, submission of specimen other than nasopharyngeal swab, presence of viral mutation(s) within the areas targeted by this assay, and inadequate number of viral copies(<138 copies/mL). A negative result must be combined with clinical observations, patient history, and epidemiological information. The expected result is Negative.  Fact Sheet for Patients:  EntrepreneurPulse.com.au  Fact Sheet for Healthcare Providers:   IncredibleEmployment.be  This test is no t yet approved or cleared by the [place] FDA and  has been authorized for detection and/or diagnosis of SARS-CoV-2 by FDA under an Emergency Use Authorization (EUA). This EUA will remain  in effect (meaning this test can be used) for the duration of the COVID-19 declaration under Section 564(b)(1) of the Act, 21 U.S.C.section 360bbb-3(b)(1), unless the authorization is terminated  or revoked sooner.       Influenza A by PCR NEGATIVE NEGATIVE   Influenza B by PCR NEGATIVE NEGATIVE    Comment: (NOTE) The Xpert Xpress SARS-CoV-2/FLU/RSV plus assay is intended as an aid in the diagnosis of influenza from Nasopharyngeal swab specimens and should not be used as a sole basis for treatment. Nasal washings and aspirates are unacceptable for Xpert Xpress SARS-CoV-2/FLU/RSV testing.  Fact Sheet for Patients: EntrepreneurPulse.com.au  Fact Sheet for Healthcare Providers: IncredibleEmployment.be  This test is not yet approved or cleared by the [place] FDA and has been authorized for detection and/or diagnosis of SARS-CoV-2 by FDA under an Emergency Use Authorization (EUA). This EUA will remain in effect (meaning this test can be used) for the duration of the COVID-19 declaration under Section 564(b)(1) of the Act, 21 U.S.C. section 360bbb-3(b)(1), unless the authorization is terminated or revoked.  Performed at [facility], [address] [place], [place] [address]   Urinalysis, Routine w reflex microscopic     Status: Abnormal   Collection Time: 01/12/21  5:47 AM  Result Value Ref Range   Color, Urine YELLOW YELLOW    Comment: YELLOW   APPearance CLEAR CLEAR    Comment: CLEAR   Specific Gravity, Urine 1.020 1.005 - 1.030   pH 6.0 5.0 - 8.0   Glucose, UA NEGATIVE NEGATIVE mg/dL   Hgb urine dipstick TRACE (A) NEGATIVE   Bilirubin Urine NEGATIVE NEGATIVE    Ketones, ur NEGATIVE NEGATIVE mg/dL   Protein, ur TRACE (A) NEGATIVE mg/dL   Nitrite NEGATIVE NEGATIVE   Leukocytes,Ua LARGE (A) NEGATIVE    Comment: Performed at [facility], [address] [place], [place] [address]  Urinalysis, Microscopic (reflex)     Status: Abnormal   Collection Time: 01/12/21  5:47 AM  Result Value Ref Range   RBC / HPF 21-50 0 - 5 RBC/hpf   WBC, UA >50 0 - 5 WBC/hpf   Bacteria, UA NONE SEEN NONE SEEN   Squamous Epithelial / LPF 0-5 0 - 5   Non Squamous Epithelial PRESENT (A) NONE SEEN   Mucus PRESENT    WBC Casts, UA PRESENT     Comment: Performed at [facility], [address], [place], [place] [address]  Urine Culture     Status: Abnormal   Collection Time: 01/12/21  5:47 AM   Specimen: Urine, Clean Catch  Result Value Ref Range   Specimen Description      URINE, CLEAN CATCH Performed at [facility], [address], [place], [place] [address]  Special Requests      NONE Performed at [facility], [address] [place], [place] [address]    Culture (A)     <10,000 COLONIES/mL INSIGNIFICANT GROWTH Performed at [facility] [address], [place], [place] [address]    Report Status 01/13/2021 FINAL   CBC with Differential/Platelet     Status: None   Collection Time: 01/12/21  6:05 AM  Result Value Ref Range   WBC 5.5 4.0 - 10.5 K/uL   RBC 4.07 3.87 - 5.11 MIL/uL   Hemoglobin 12.2 12.0 - 15.0 g/dL   HCT 39.5 36.0 - 46.0 %   MCV 97.1 80.0 - 100.0 fL   MCH 30.0 26.0 - 34.0 pg   MCHC 30.9 30.0 - 36.0 g/dL   RDW 13.7 11.5 - 15.5 %   Platelets 169 150 - 400 K/uL   nRBC 0.0 0.0 - 0.2 %   Neutrophils Relative % 55 %   Neutro Abs 3.0 1.7 - 7.7 K/uL   Lymphocytes Relative 34 %   Lymphs Abs 1.9 0.7 - 4.0 K/uL   Monocytes Relative 7 %   Monocytes Absolute 0.4 0.1 - 1.0 K/uL   Eosinophils Relative 4 %   Eosinophils Absolute 0.2 0.0 - 0.5 K/uL   Basophils Relative 0 %   Basophils Absolute 0.0 0.0  - 0.1 K/uL   Immature Granulocytes 0 %   Abs Immature Granulocytes 0.01 0.00 - 0.07 K/uL    Comment: Performed at [facility], [place], [place] [address]  Troponin I (High Sensitivity)     Status: None   Collection Time: 01/12/21  6:05 AM  Result Value Ref Range   Troponin I (High Sensitivity) 4 <18 ng/L    Comment: (NOTE) Elevated high sensitivity troponin I (hsTnI) values and significant  changes across serial measurements may suggest ACS but many other  chronic and acute conditions are known to elevate hsTnI results.  Refer to the "Links" section for chest pain algorithms and additional  guidance. Performed at [facility], [address] [place], [place] [address]     Assessment/Plan:  AAA (abdominal aortic aneurysm) [facility]) Her CT scan does demonstrate a 4.6 cm infrarenal abdominal aortic aneurysm with at least moderate iliac artery occlusive disease as well. We discussed with she and her family members that the size, the risk of rupture is less than 1 %/year we would not generally recommend repair even for a fit and healthy individual.  With her extensive comorbidities and limited functional status, we would potentially entertain repair at the normal 5.0 cm, but we may allow this to go up to a larger size such as 5.5 cm before having this discussion on potential repair.  She does appear to have anatomy that would be suitable for endovascular repair.  We discussed the pathophysiology and natural history of abdominal aortic aneurysm.  They voiced their understanding.  Stroke [facility]) Seems fairly debilitated from this  Essential hypertension blood pressure control important in reducing the progression of atherosclerotic disease and aneurysmal growth. On appropriate oral medications.   Type 2 diabetes mellitus with stage 4 chronic kidney disease (HCC) blood glucose control important in reducing the progression of atherosclerotic disease. Also,  involved in wound healing. On appropriate medications.      Leotis Pain 02/01/2021, 2:34 PM   This note was created with Dragon medical transcription system.  Any errors from dictation are unintentional.

## 2021-02-15 DIAGNOSIS — R300 Dysuria: Secondary | ICD-10-CM | POA: Diagnosis not present

## 2021-02-15 DIAGNOSIS — R059 Cough, unspecified: Secondary | ICD-10-CM | POA: Diagnosis not present

## 2021-02-16 DIAGNOSIS — N390 Urinary tract infection, site not specified: Secondary | ICD-10-CM | POA: Diagnosis not present

## 2021-02-18 DIAGNOSIS — R059 Cough, unspecified: Secondary | ICD-10-CM | POA: Diagnosis not present

## 2021-03-01 DIAGNOSIS — B351 Tinea unguium: Secondary | ICD-10-CM | POA: Diagnosis not present

## 2021-03-01 DIAGNOSIS — L601 Onycholysis: Secondary | ICD-10-CM | POA: Diagnosis not present

## 2021-03-01 DIAGNOSIS — I739 Peripheral vascular disease, unspecified: Secondary | ICD-10-CM | POA: Diagnosis not present

## 2021-03-02 IMAGING — DX PORTABLE CHEST - 1 VIEW
1 series · 1 of 1 positions shown · non-contrast
Comparison: July 15, 2018

CLINICAL DATA: Acute mental status change.

EXAM:
PORTABLE CHEST 1 VIEW

[chest ap]
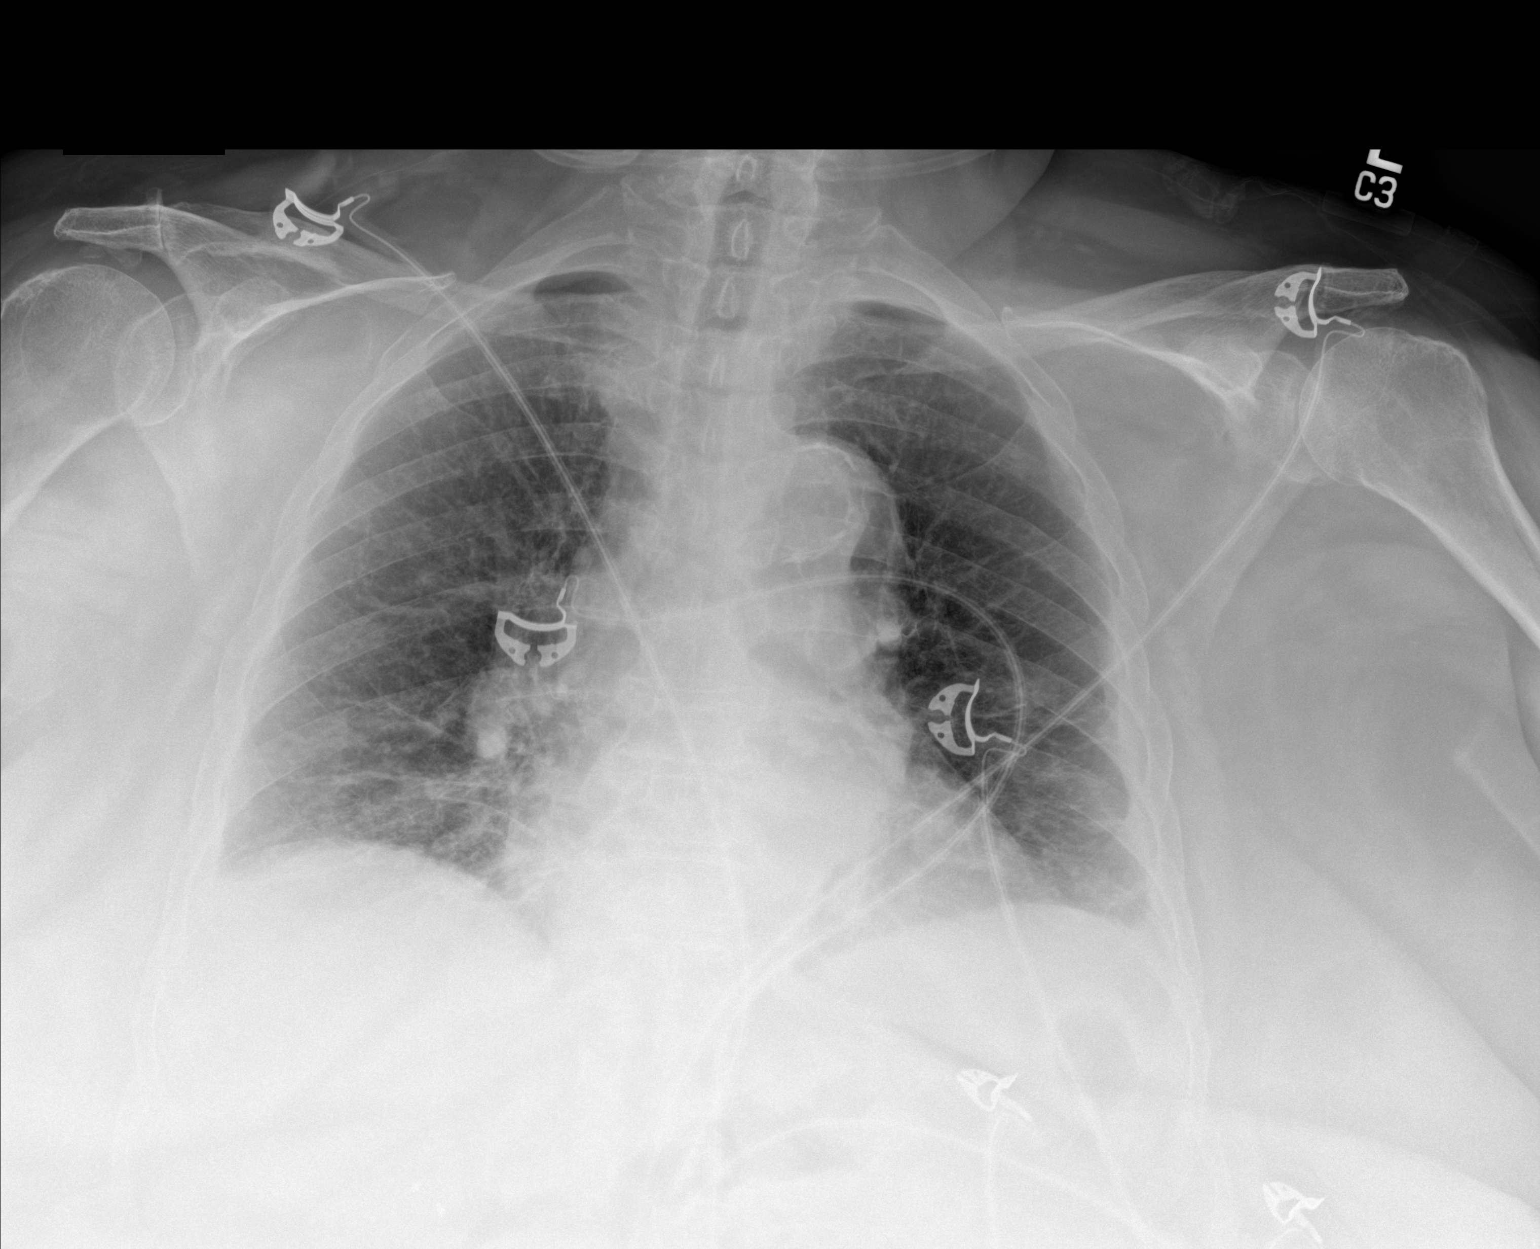

[1 of 1 positions shown; findings below may reference images not displayed]

FINDINGS: The heart size and mediastinal contours are within normal limits.
Both lungs are clear. The visualized skeletal structures are
unremarkable.
IMPRESSION: No active disease.

## 2021-03-02 IMAGING — CT CT HEAD WITHOUT CONTRAST
3 series · 16 of 47 positions shown, 19 images · non-contrast
Comparison: Head CT scan 02/28/2018

CLINICAL DATA: Altered mental status. Worsening shortness of breath
over the past 3 days.

EXAM:
CT HEAD WITHOUT CONTRAST
TECHNIQUE: Contiguous axial images were obtained from the base of the skull
through the vertex without intravenous contrast.

[Series 2: head wo · axial · 0.43mm/px · z∈[-122,+3]mm · 10 of 30 slices shown, 13 images]
[im 3/30  brain]
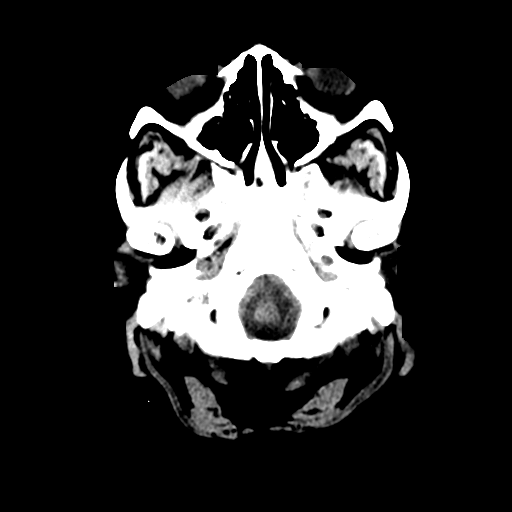
[im 3/30  bone]
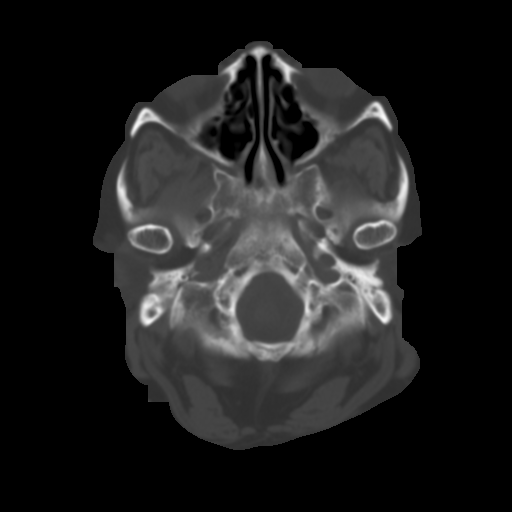
[im 6/30  brain]
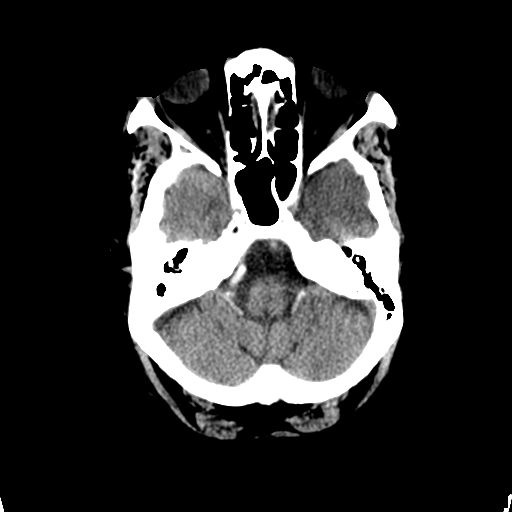
[im 9/30  brain]
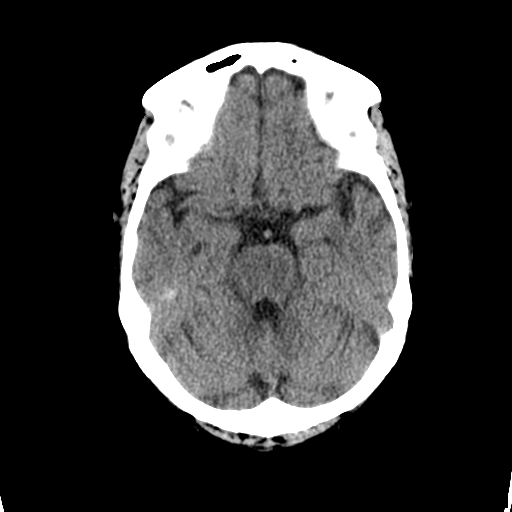
[im 11/30  brain]
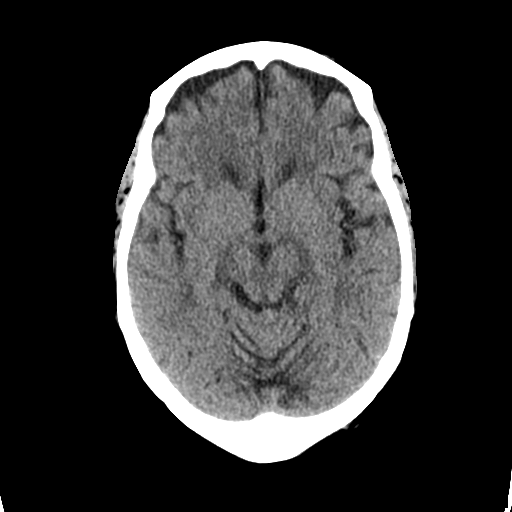
[im 14/30  brain]
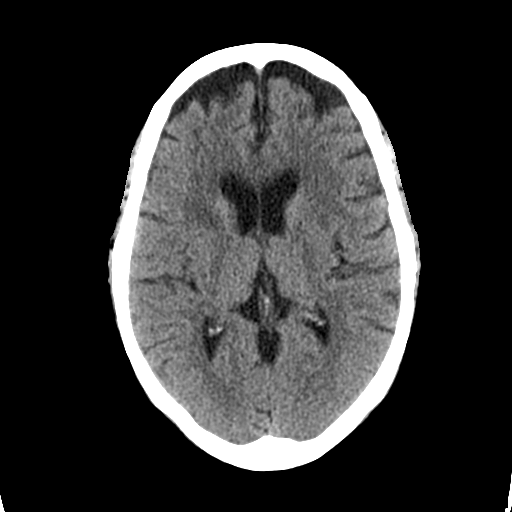
[im 14/30  bone]
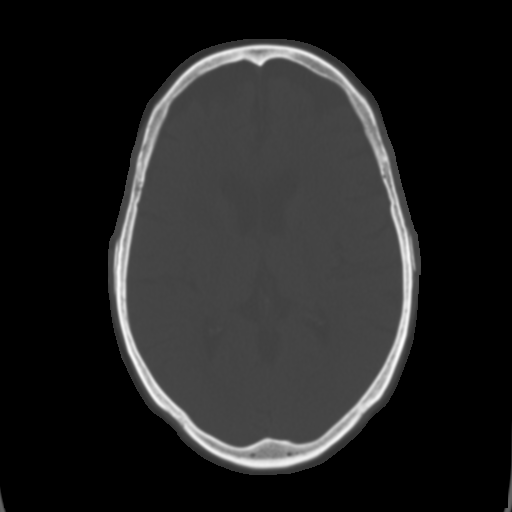
[im 17/30  brain]
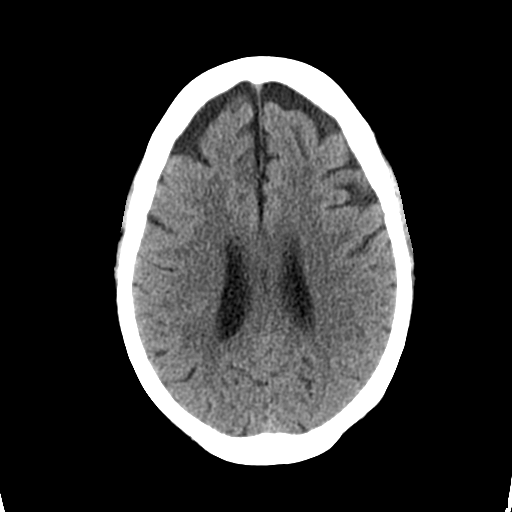
[im 20/30  brain]
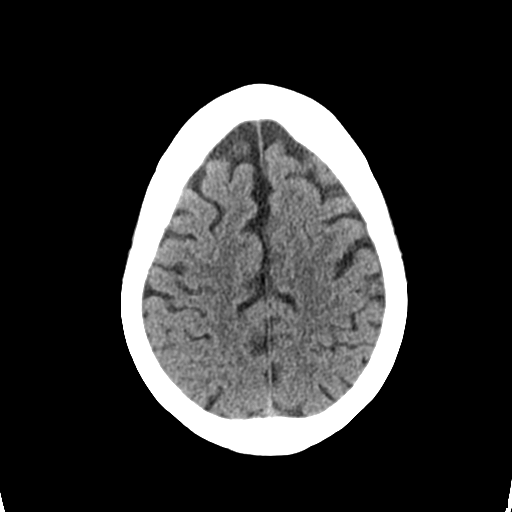
[im 23/30  brain]
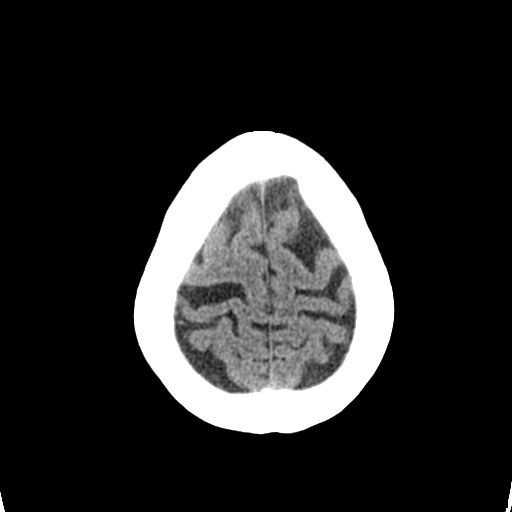
[im 25/30  brain]
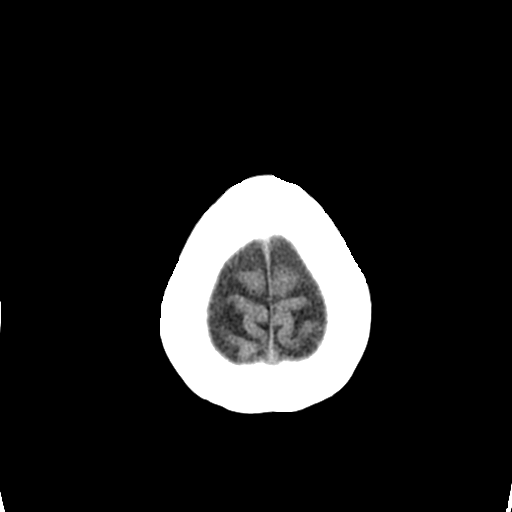
[im 25/30  bone]
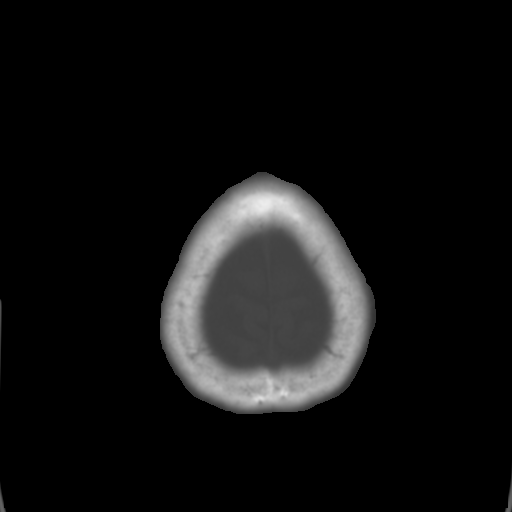
[im 28/30  brain]
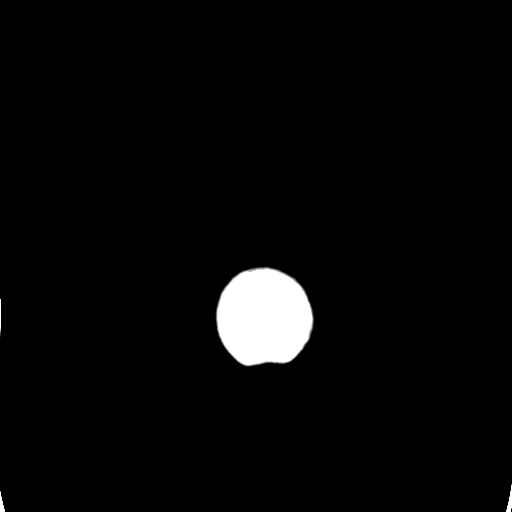

[Series 4: coronal soft tissue · coronal · 0.30mm/px · 3 of 65 slices shown]
[im 22/65  brain]
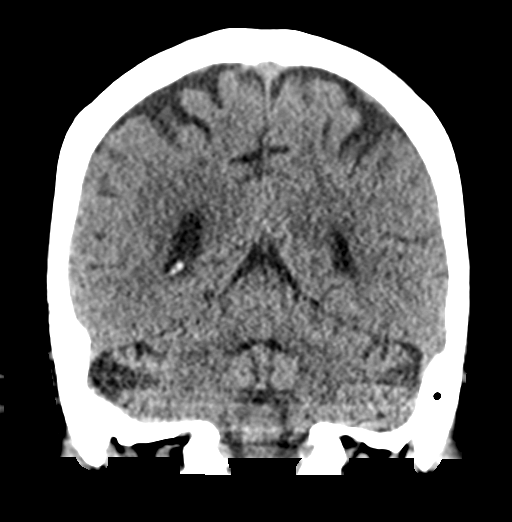
[im 29/65  brain]
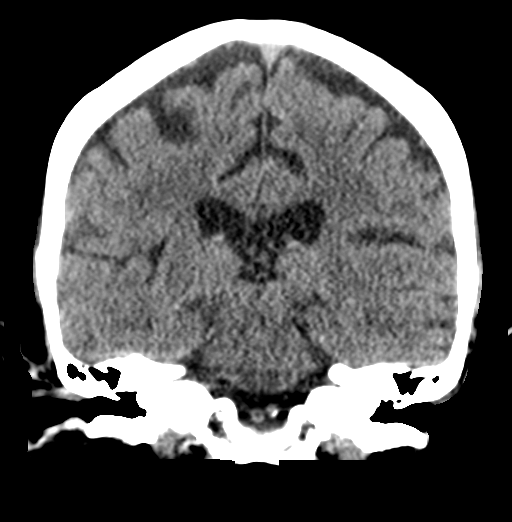
[im 36/65  brain]
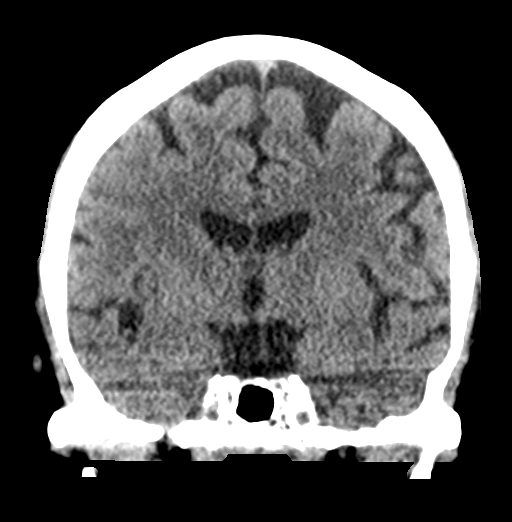

[Series 5: sagittal soft tissue · sagittal · 0.30mm/px · 3 of 49 slices shown]
[im 17/49  brain]
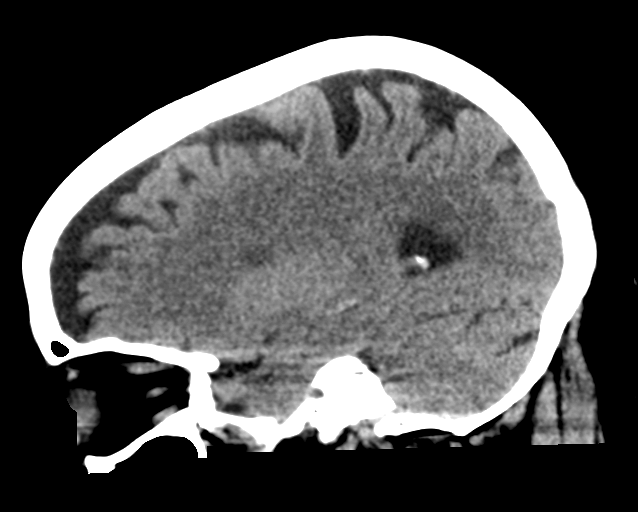
[im 25/49  brain]
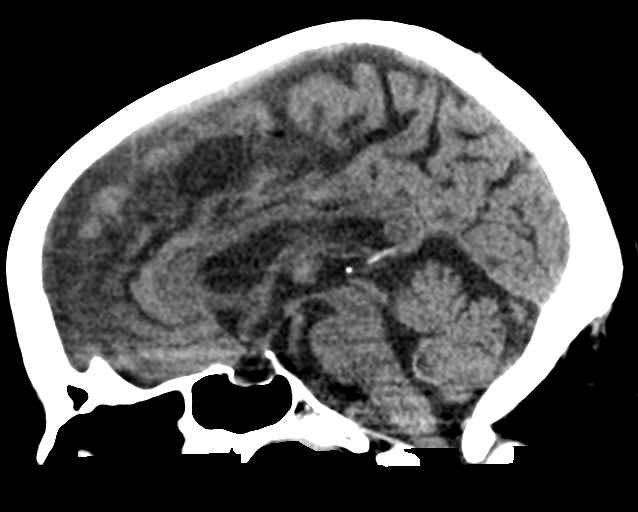
[im 33/49  brain]
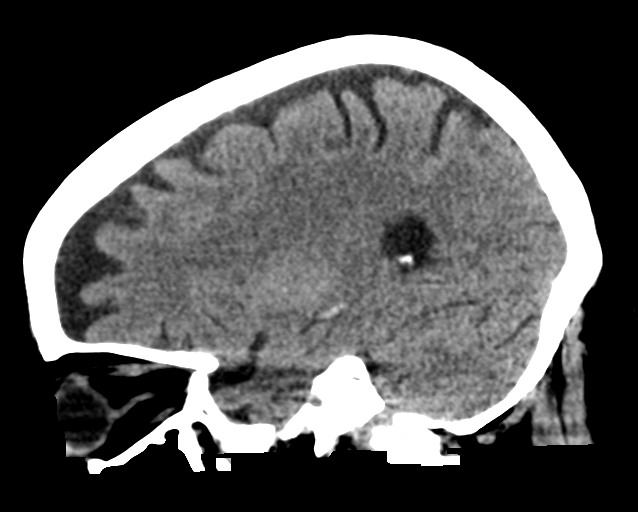

[16 of 47 positions shown; findings below may reference images not displayed]

FINDINGS: Brain: No evidence of acute infarction, hemorrhage, hydrocephalus,
extra-axial collection or mass lesion/mass effect. Mild atrophy and
chronic microvascular ischemic change noted.

Vascular: No hyperdense vessel or unexpected calcification.

Skull: Intact.  No focal lesion.

Sinuses/Orbits: Right mastoid effusion is unchanged. Otherwise
negative.

Other: None.
IMPRESSION: No acute abnormality.

Mild atrophy and chronic microvascular ischemic change.

Right mastoid effusion, unchanged.

## 2021-03-15 DIAGNOSIS — Z79899 Other long term (current) drug therapy: Secondary | ICD-10-CM | POA: Diagnosis not present

## 2021-03-15 DIAGNOSIS — I10 Essential (primary) hypertension: Secondary | ICD-10-CM | POA: Diagnosis not present

## 2021-03-15 DIAGNOSIS — E785 Hyperlipidemia, unspecified: Secondary | ICD-10-CM | POA: Diagnosis not present

## 2021-03-16 DIAGNOSIS — E785 Hyperlipidemia, unspecified: Secondary | ICD-10-CM | POA: Diagnosis not present

## 2021-03-16 DIAGNOSIS — G4733 Obstructive sleep apnea (adult) (pediatric): Secondary | ICD-10-CM | POA: Diagnosis not present

## 2021-03-18 IMAGING — CT CT HEAD WITHOUT CONTRAST
3 series · 15 of 46 positions shown, 18 images · non-contrast
Comparison: August 21, 2018

CLINICAL DATA: Encephalopathy due to urinary tract infection with
confusion.

EXAM:
CT HEAD WITHOUT CONTRAST
TECHNIQUE: Contiguous axial images were obtained from the base of the skull
through the vertex without intravenous contrast.

[Series 2: head wo · axial · 0.42mm/px · z∈[+398,+518]mm · 9 of 29 slices shown, 12 images]
[im 3/29  brain]
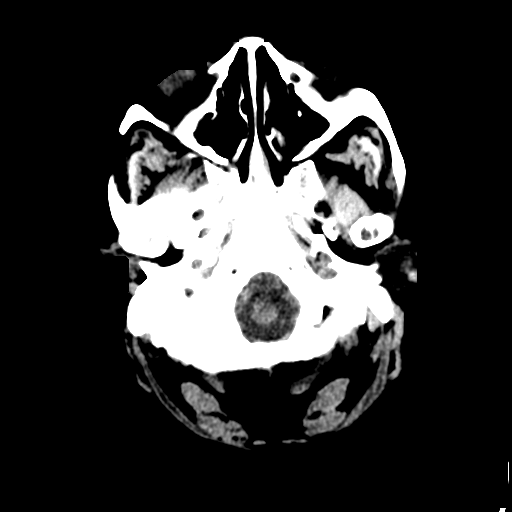
[im 3/29  bone]
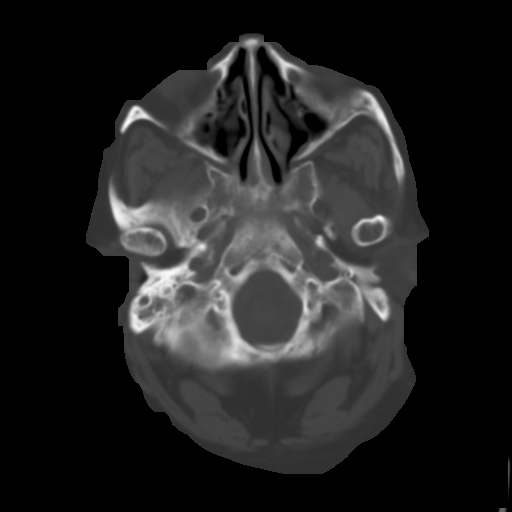
[im 6/29  brain]
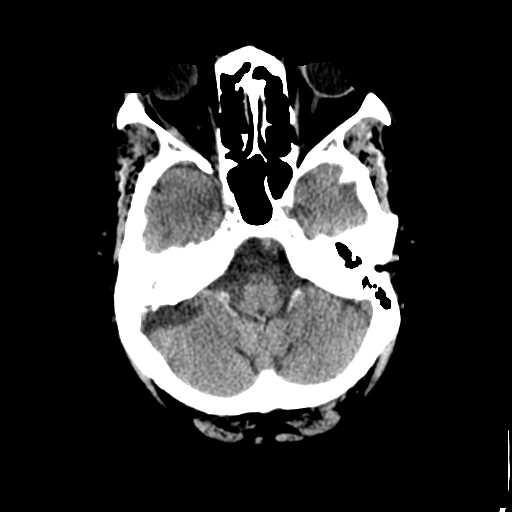
[im 9/29  brain]
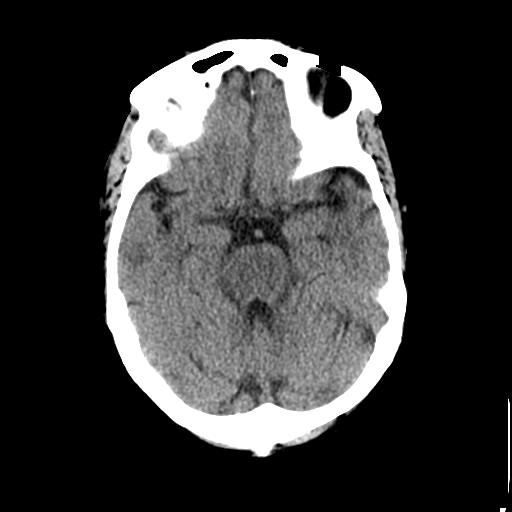
[im 12/29  brain]
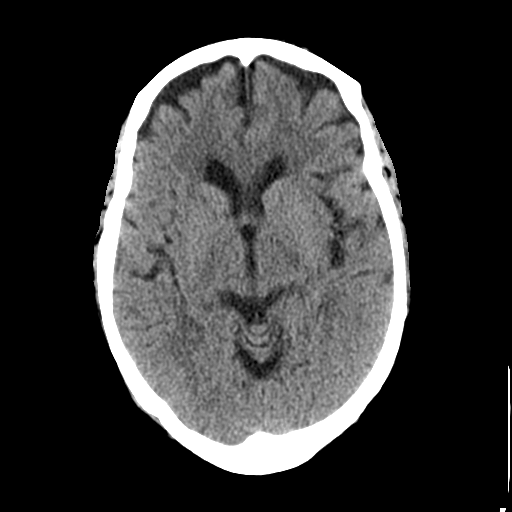
[im 15/29  brain]
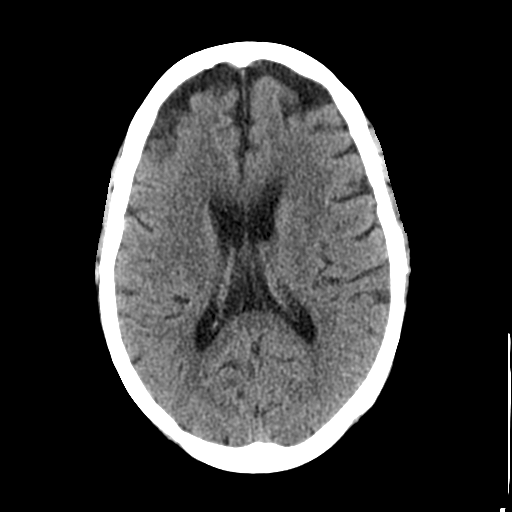
[im 15/29  bone]
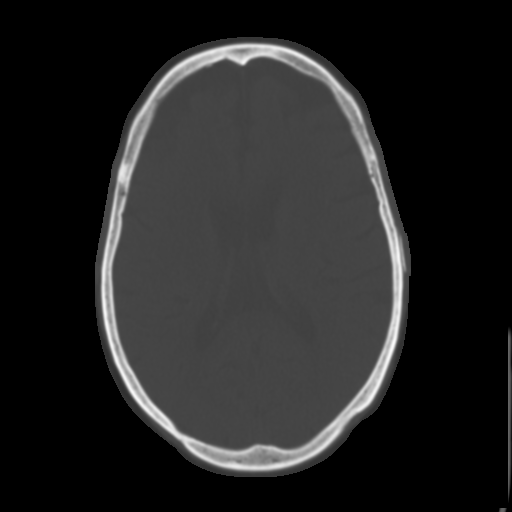
[im 18/29  brain]
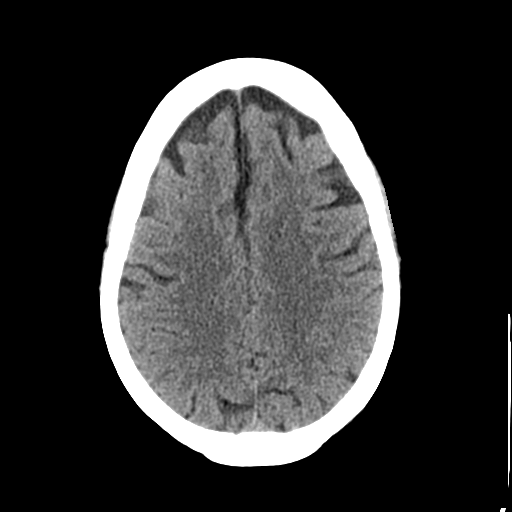
[im 21/29  brain]
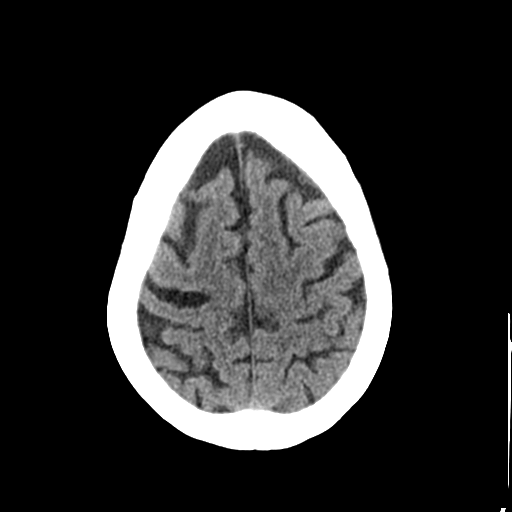
[im 24/29  brain]
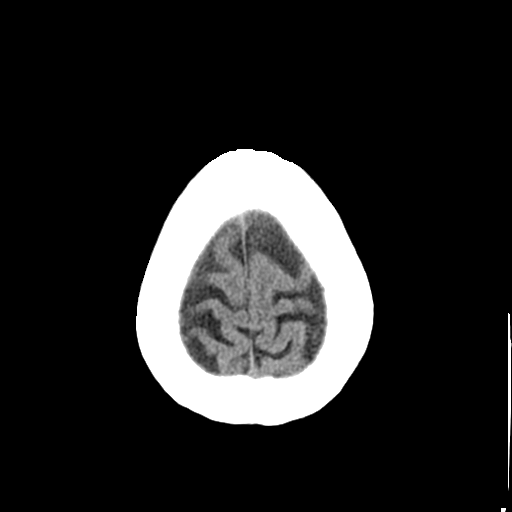
[im 27/29  brain]
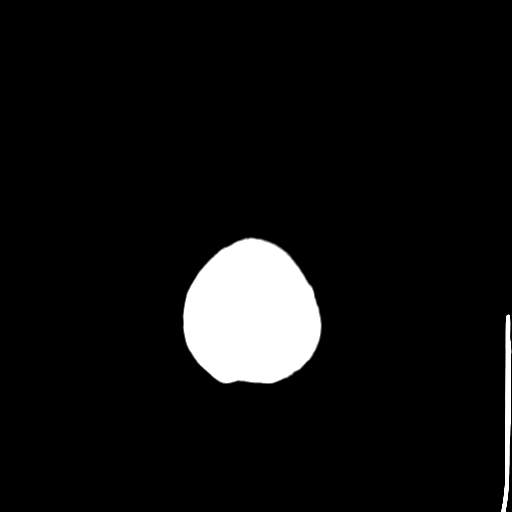
[im 27/29  bone]
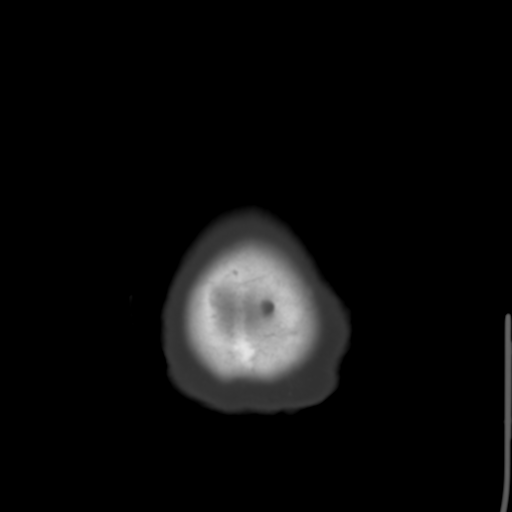

[Series 4: coronal soft tissue · coronal · 0.30mm/px · 3 of 67 slices shown]
[im 23/67  brain]
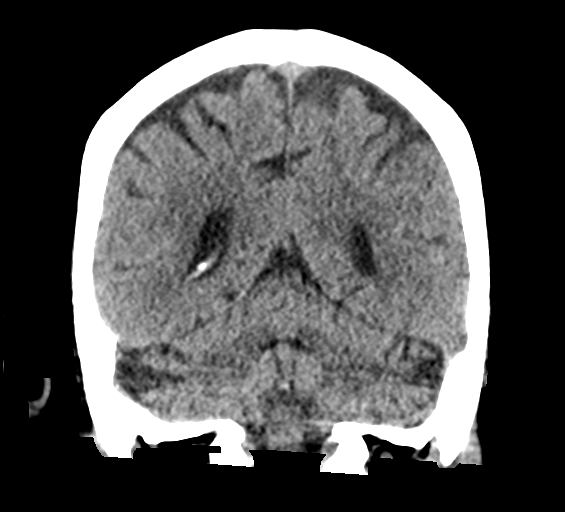
[im 30/67  brain]
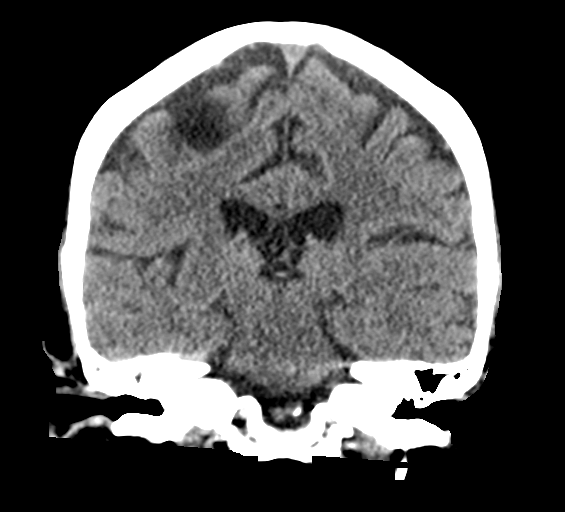
[im 37/67  brain]
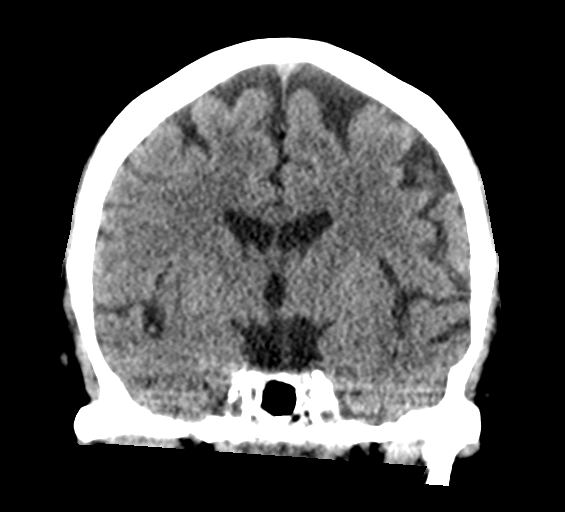

[Series 5: sagittal soft tissue · sagittal · 0.29mm/px · 3 of 50 slices shown]
[im 17/50  brain]
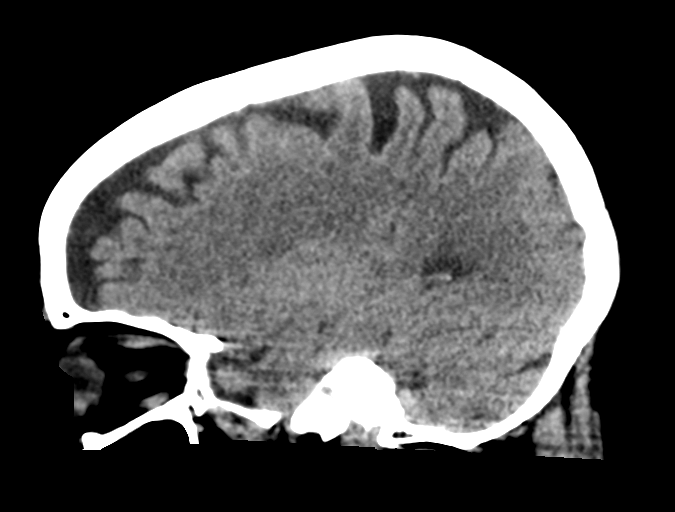
[im 25/50  brain]
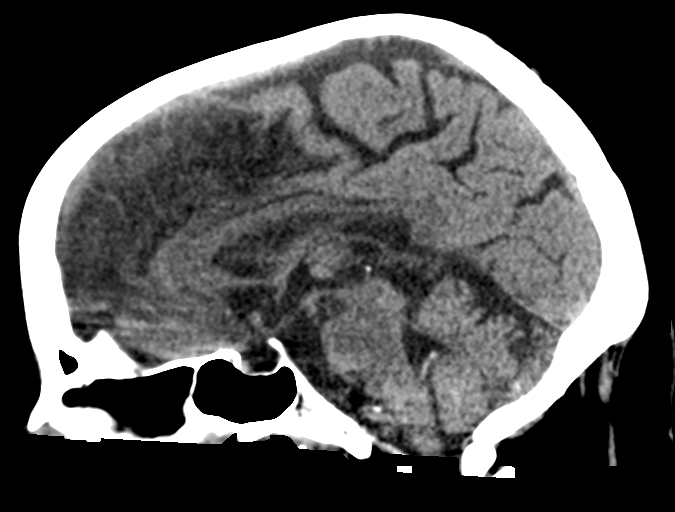
[im 33/50  brain]
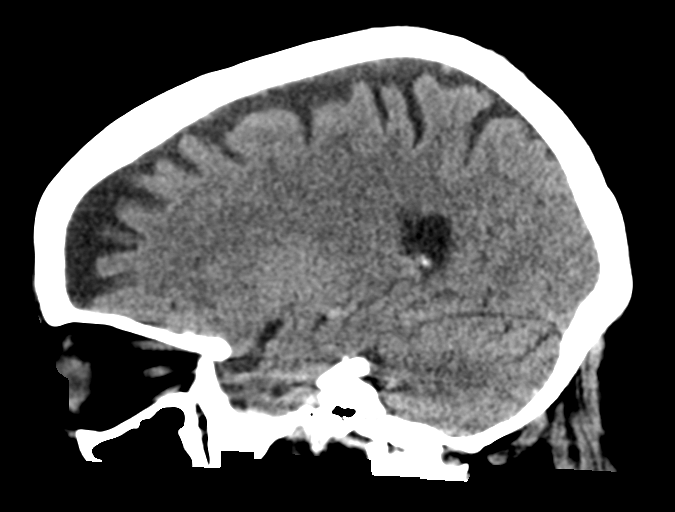

[15 of 46 positions shown; findings below may reference images not displayed]

FINDINGS: Brain: No evidence of acute infarction, hemorrhage, hydrocephalus,
extra-axial collection or mass lesion/mass effect. Moderate brain
parenchymal volume loss and deep white matter microangiopathy.

Vascular: Calcific atherosclerotic disease of the intracranial
arteries.

Skull: Partial opacification of the right mastoid air cells. Mild
mucosal thickening of the ethmoid sinuses.

Sinuses/Orbits: No acute finding.

Other: None.
IMPRESSION: 1. No acute intracranial abnormality.
2. Moderate brain parenchymal atrophy and chronic microvascular
disease.

## 2021-03-18 IMAGING — DX PORTABLE CHEST - 1 VIEW
1 series · 1 of 1 positions shown · non-contrast
Comparison: August 21, 2018

CLINICAL DATA: History of stroke.

EXAM:
PORTABLE CHEST 1 VIEW

[chest ap]
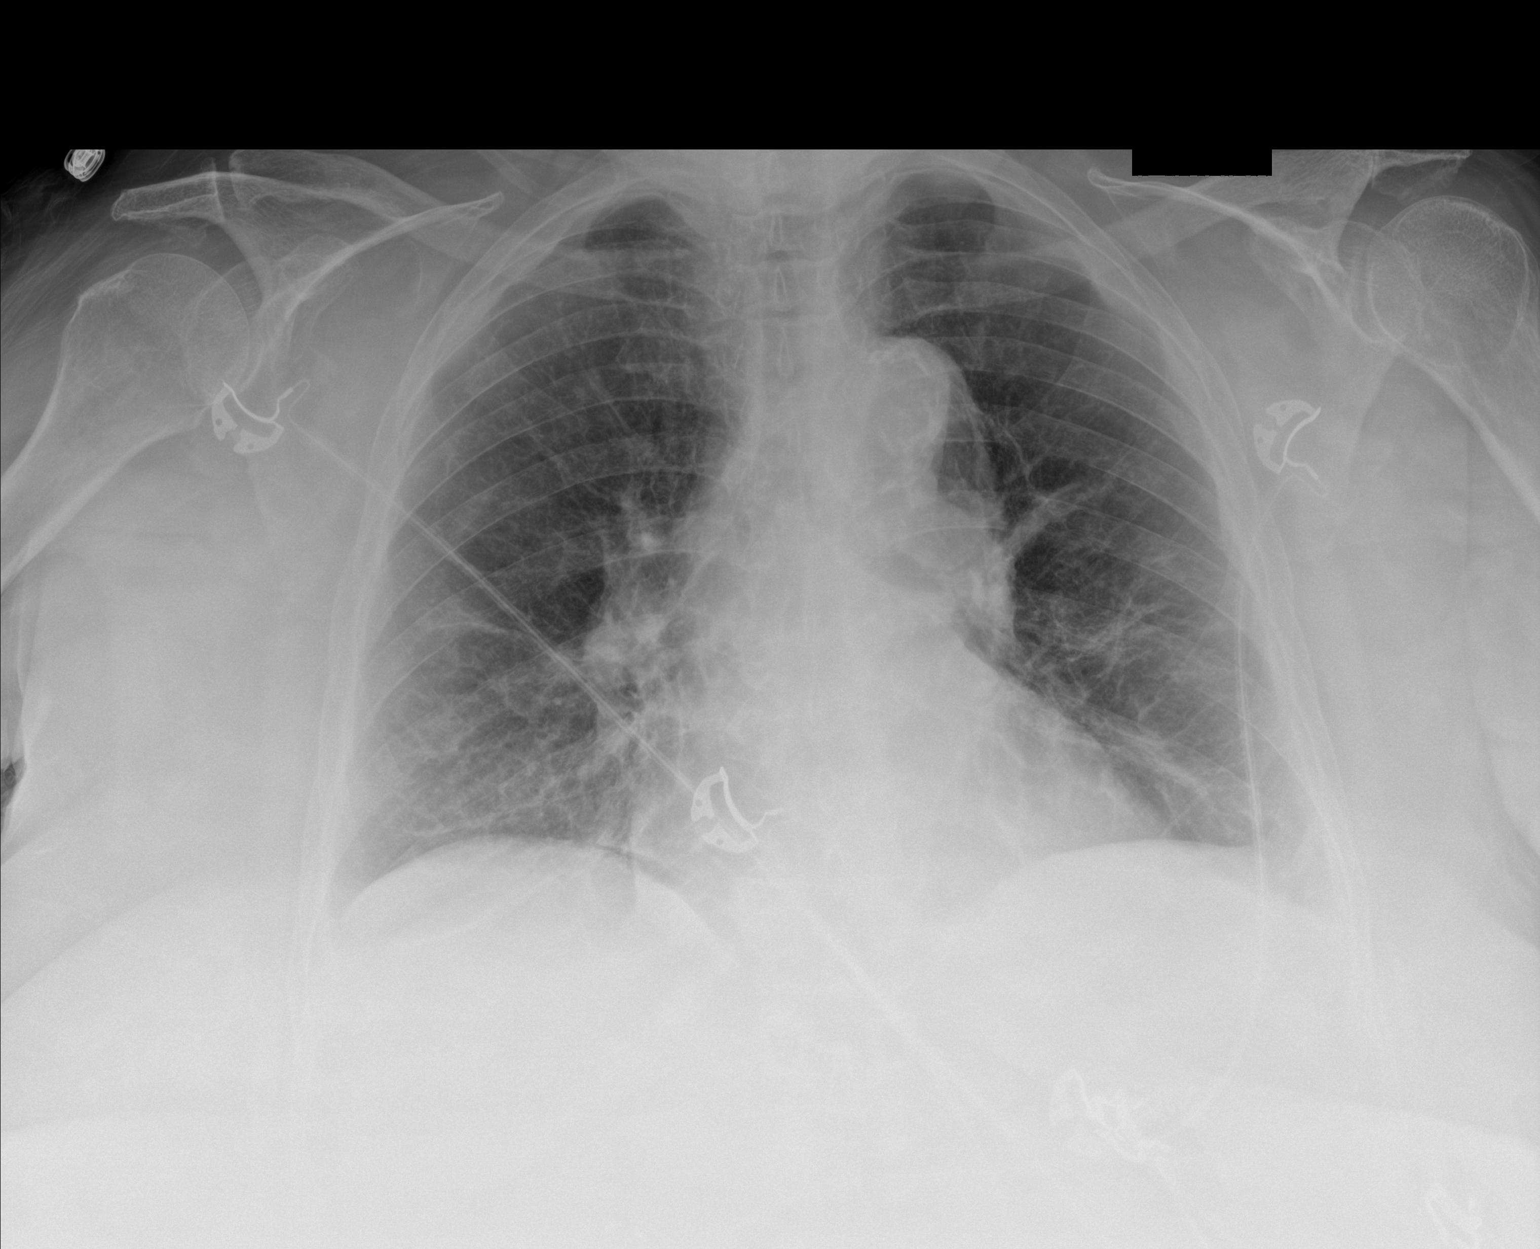

[1 of 1 positions shown; findings below may reference images not displayed]

FINDINGS: Cardiomediastinal silhouette is normal. Mediastinal contours appear
intact. Calcific atherosclerotic disease of the aorta.

There is no evidence of focal airspace consolidation, pleural
effusion or pneumothorax. Bilateral pleural/subpleural thickening.

Osseous structures are without acute abnormality. Soft tissues are
grossly normal.
IMPRESSION: 1. No active disease.
2. Bilateral pleural/subpleural thickening.

## 2021-03-19 IMAGING — DX PORTABLE LEFT KNEE - 1-2 VIEW
2 series · 2 of 2 positions shown · non-contrast
Comparison: None.

CLINICAL DATA: Chronic knee pain, swelling

EXAM:
PORTABLE LEFT KNEE - 1-2 VIEW

[knee ap]
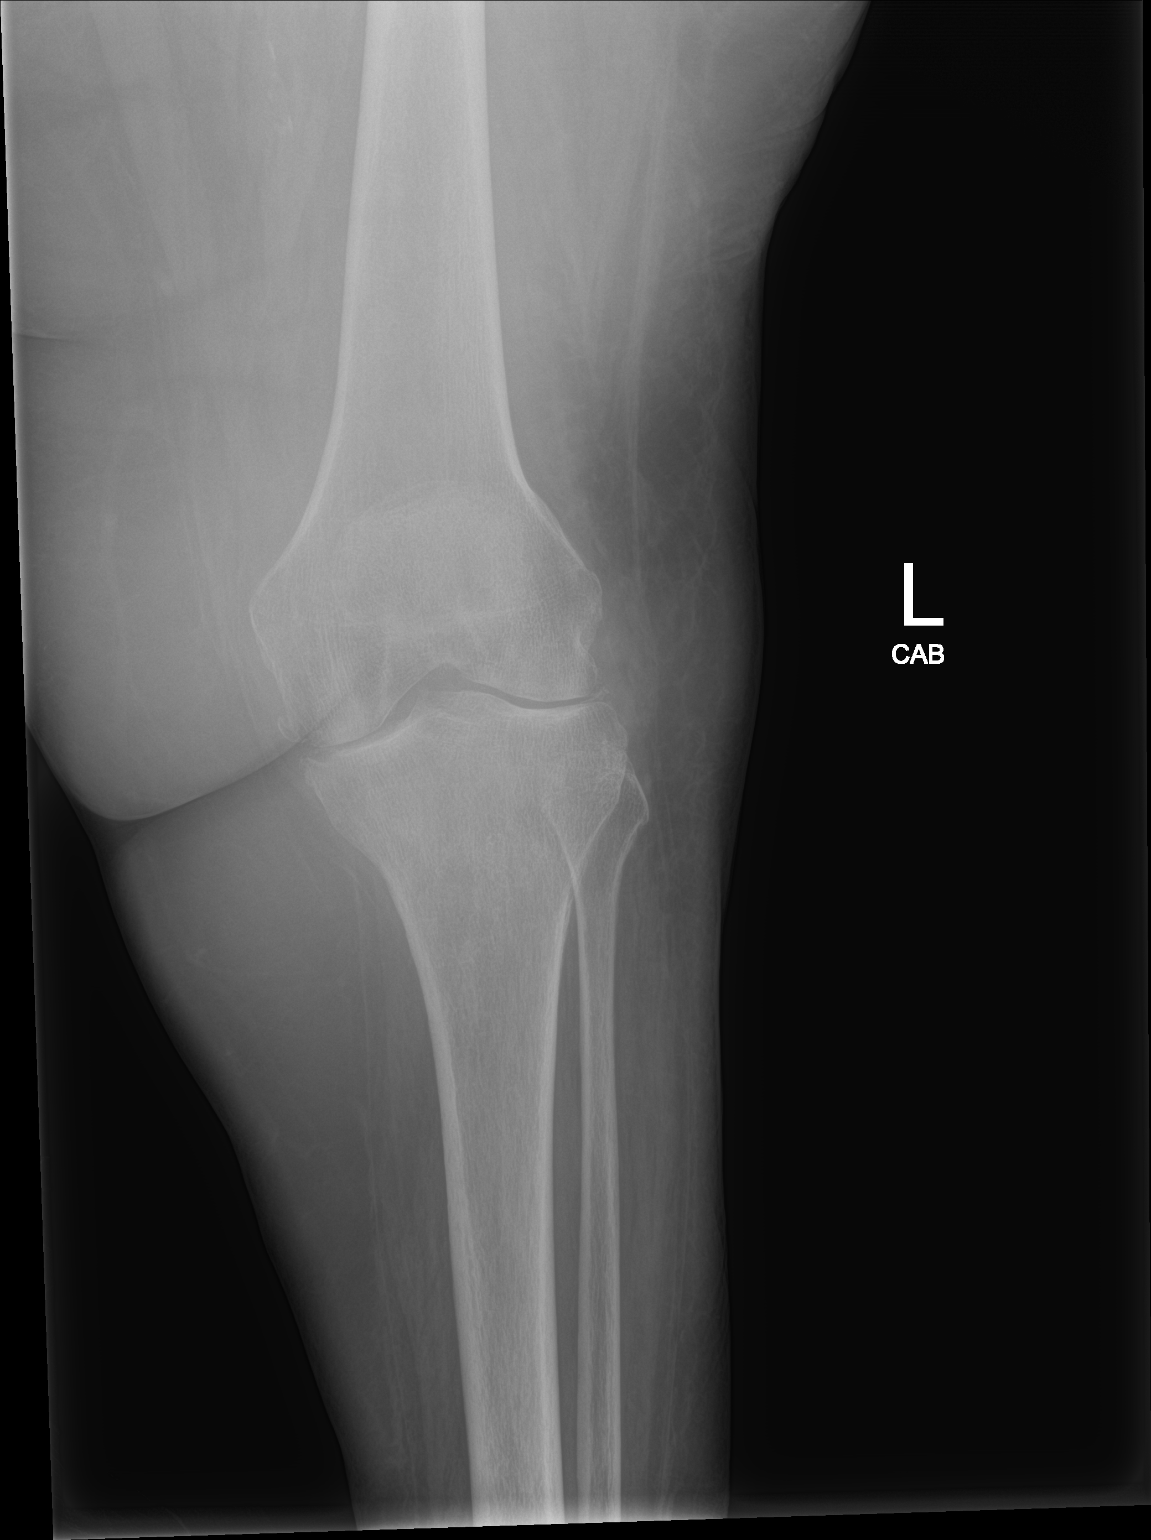

[knee lat]
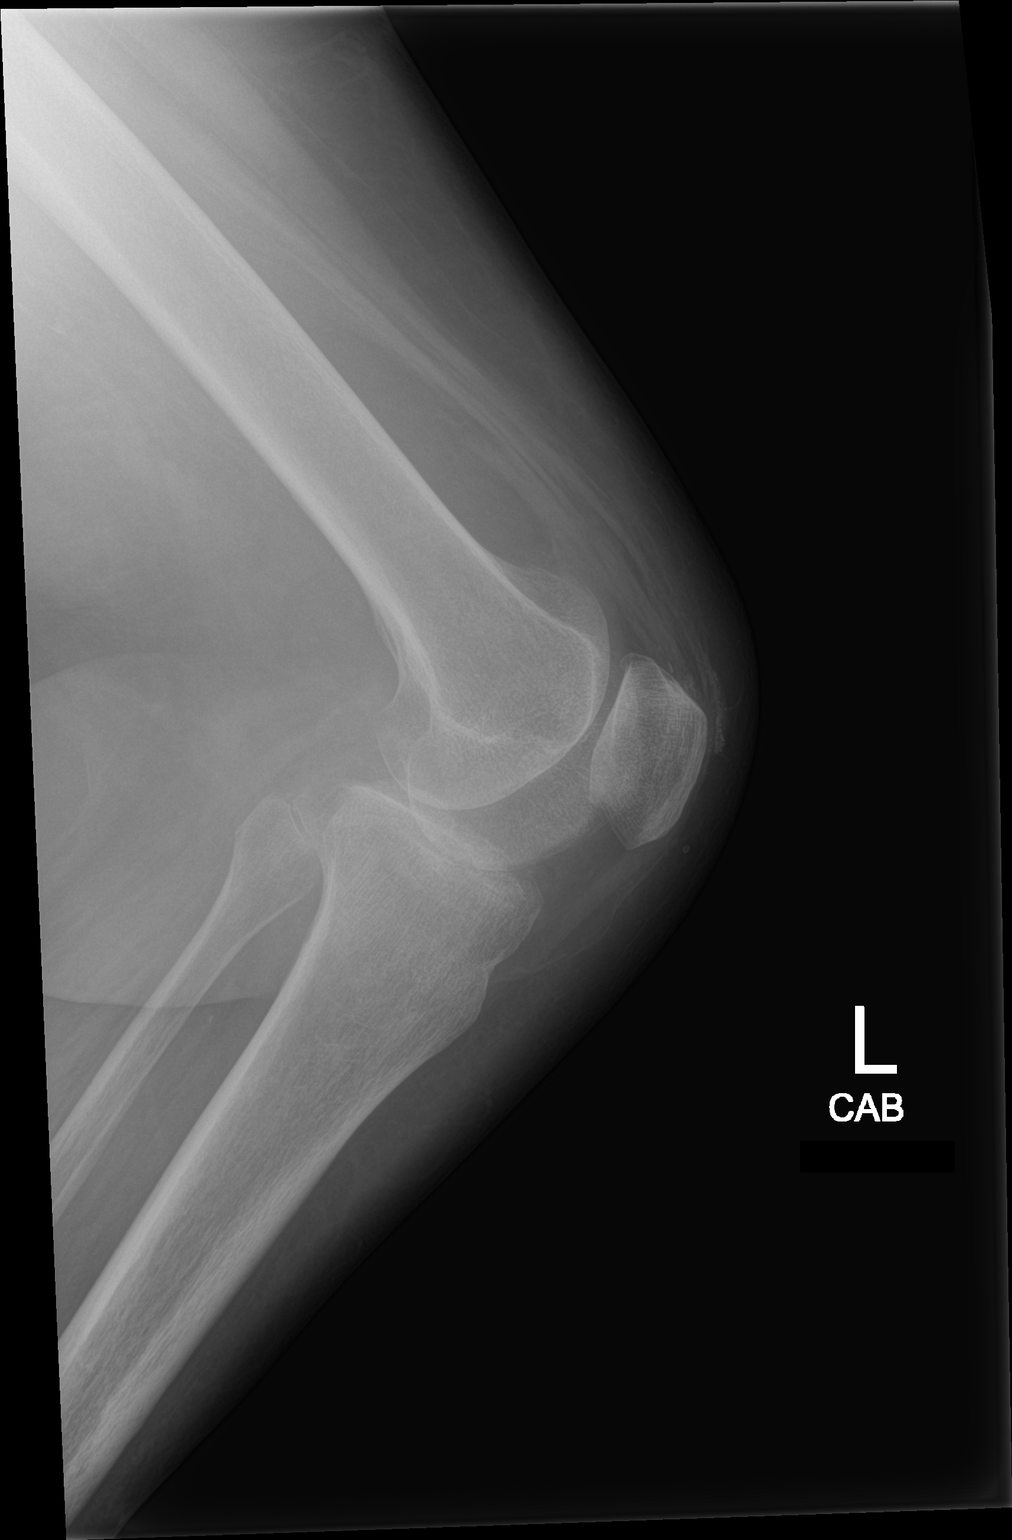

[2 of 2 positions shown; findings below may reference images not displayed]

FINDINGS: No fracture or dislocation of the left knee. Moderate
tricompartmental joint space narrowing and osteophytosis. Small
nonspecific knee joint effusion. Soft tissue edema over the anterior
extensor mechanism.
IMPRESSION: No fracture or dislocation of the left knee. Moderate
tricompartmental joint space narrowing and osteophytosis. Small
nonspecific knee joint effusion. Soft tissue edema over the anterior
extensor mechanism.

## 2021-03-21 IMAGING — US UPPER LEFT VENOUS EXTREMITY ULTRASOUND
1 series · 13 of 24 positions shown · non-contrast
Comparison: None.

CLINICAL DATA: 77-year-old female with left upper extremity pain
and swelling for 1 day



[Series 1: upper left venous extremity ultrasound · 13 of 32 slices shown]
[im 1/32]
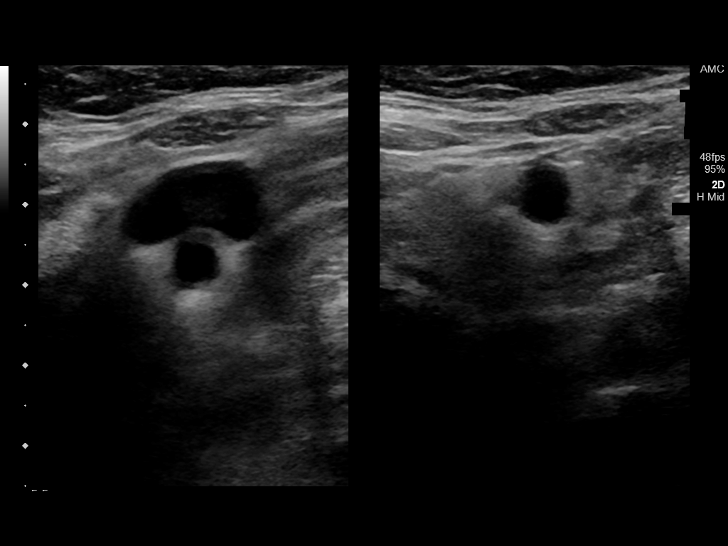
[im 3/32]
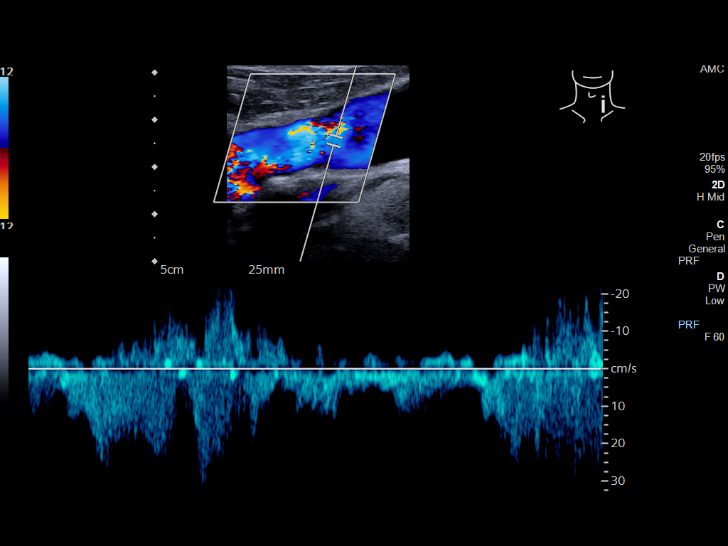
[im 6/32]
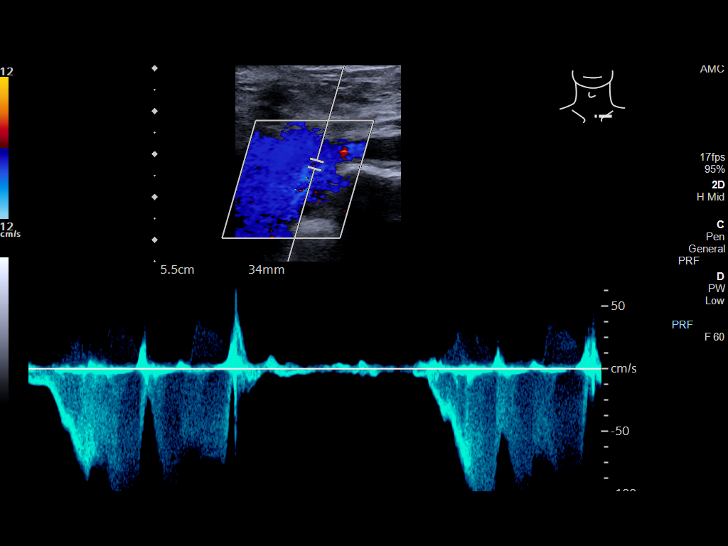
[im 9/32]
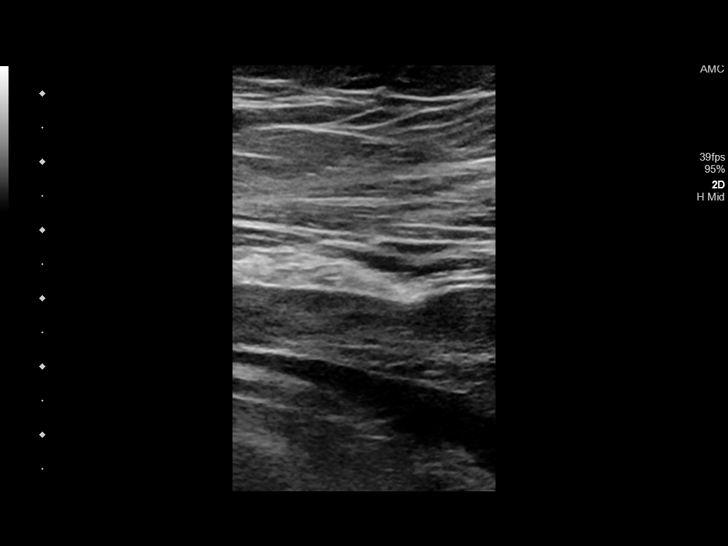
[im 11/32]
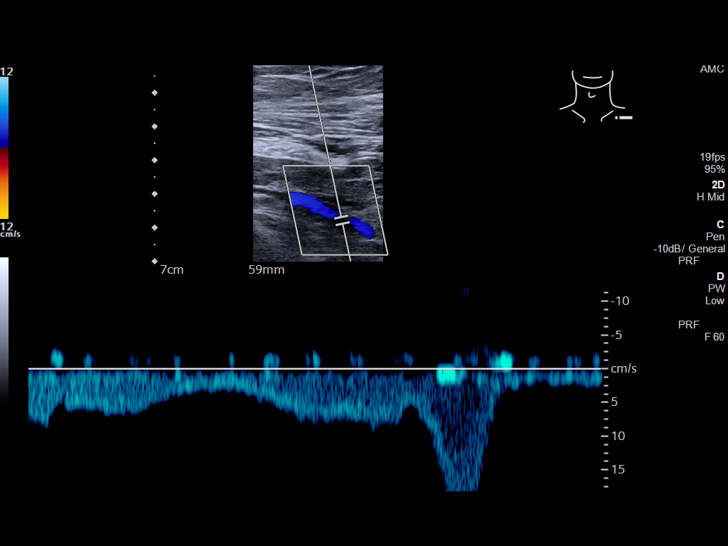
[im 14/32]
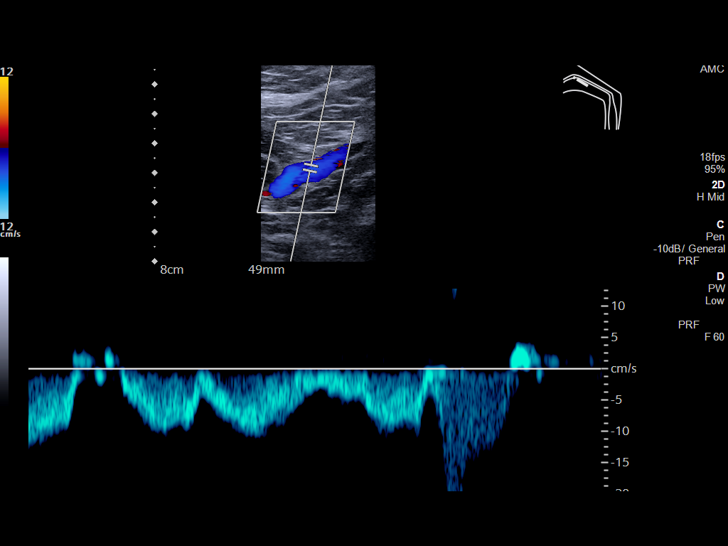
[im 17/32]
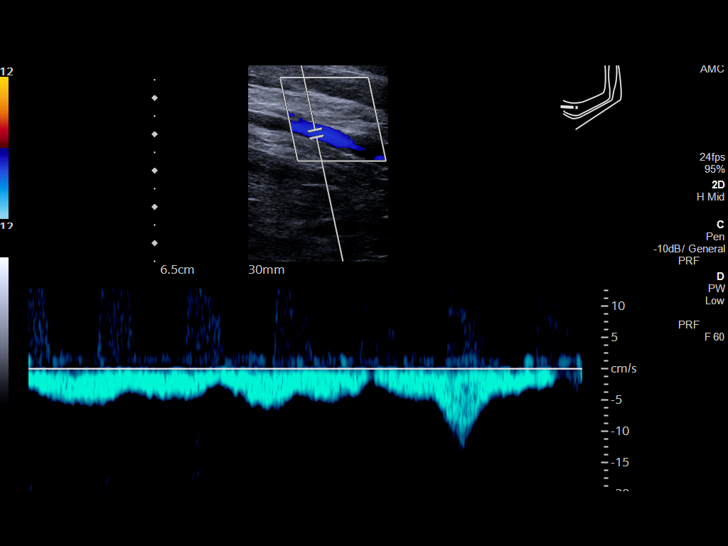
[im 18/32]
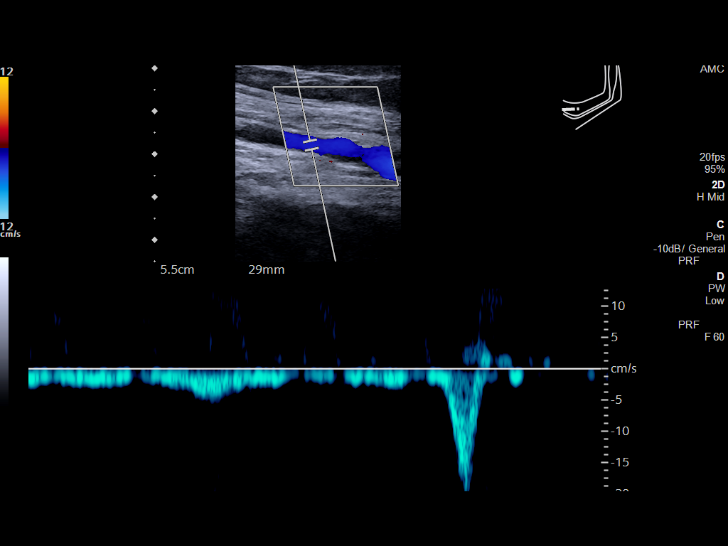
[im 21/32]
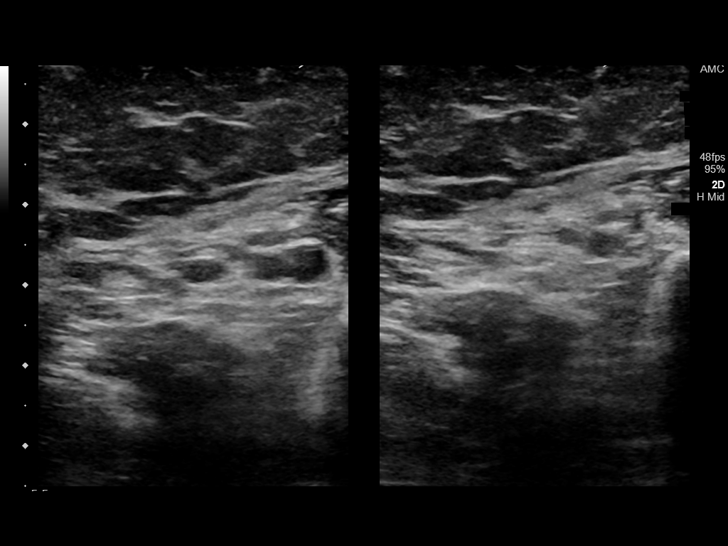
[im 23/32]
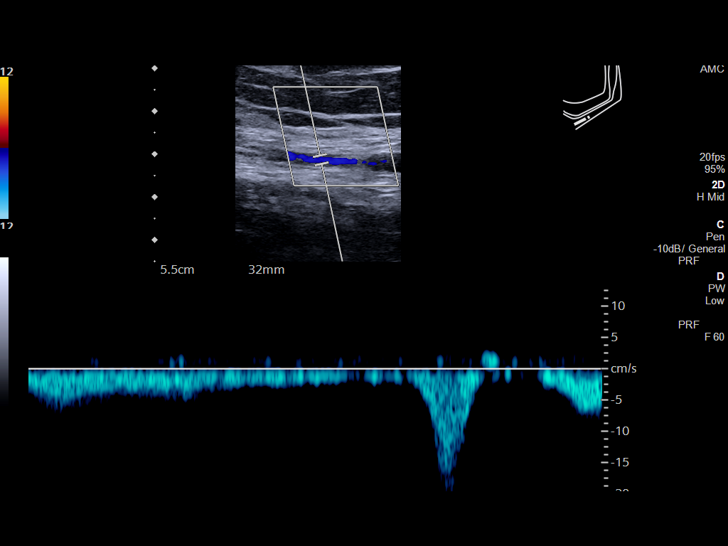
[im 26/32]
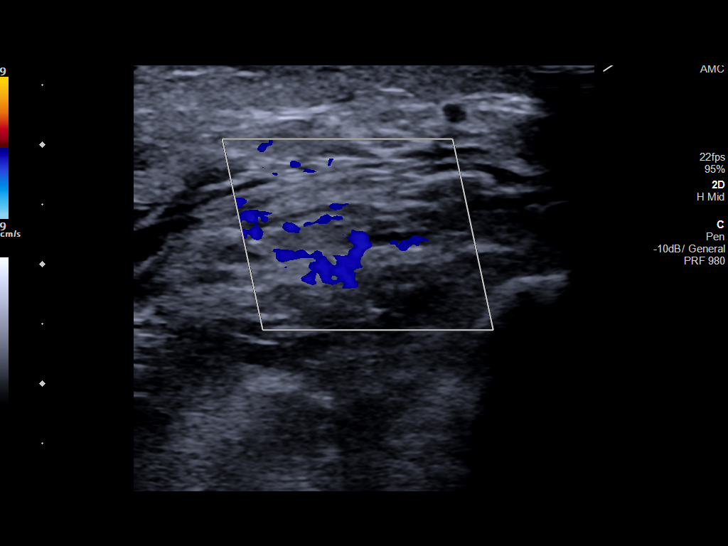
[im 29/32]
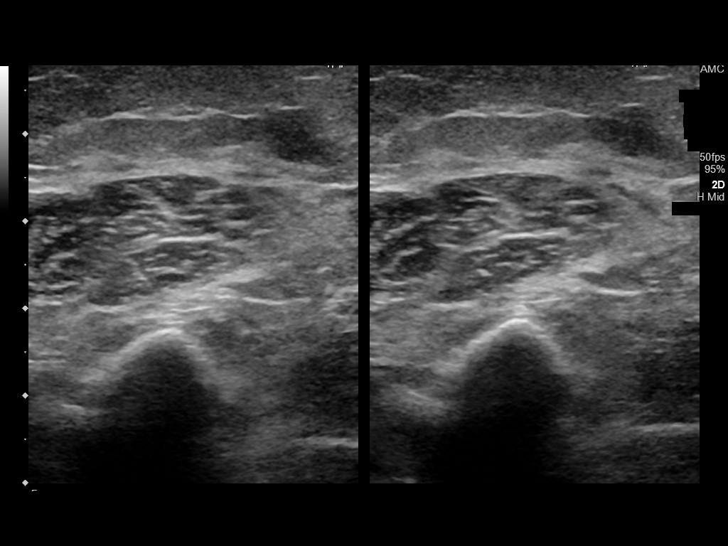
[im 32/32]
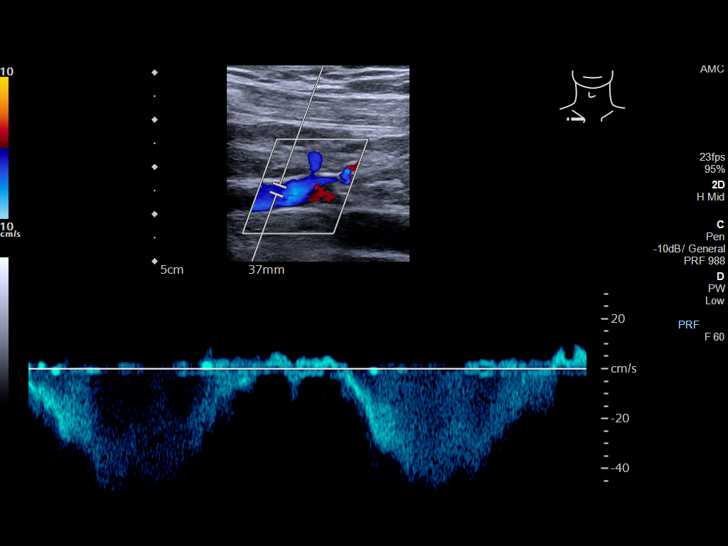

[13 of 24 positions shown; findings below may reference images not displayed]

FINDINGS: Contralateral Subclavian Vein: Respiratory phasicity is normal and
symmetric with the symptomatic side. No evidence of thrombus. Normal
compressibility.

Internal Jugular Vein: No evidence of thrombus. Normal
compressibility, respiratory phasicity and response to augmentation.

Subclavian Vein: No evidence of thrombus. Normal compressibility,
respiratory phasicity and response to augmentation.

Axillary Vein: No evidence of thrombus. Normal compressibility,
respiratory phasicity and response to augmentation.

Cephalic Vein: None visualized.  May be congenitally absent.

Basilic Vein: No evidence of thrombus. Normal compressibility,
respiratory phasicity and response to augmentation.

Brachial Veins: No evidence of thrombus. Normal compressibility,
respiratory phasicity and response to augmentation.

Radial Veins: No evidence of thrombus. Normal compressibility,
respiratory phasicity and response to augmentation.

Ulnar Veins: No evidence of thrombus. Normal compressibility,
respiratory phasicity and response to augmentation.

Venous Reflux:  None visualized.

Other Findings:  None visualized.
IMPRESSION: No evidence of DVT within the left upper extremity.

## 2021-03-28 DIAGNOSIS — J449 Chronic obstructive pulmonary disease, unspecified: Secondary | ICD-10-CM | POA: Diagnosis not present

## 2021-03-28 DIAGNOSIS — M1990 Unspecified osteoarthritis, unspecified site: Secondary | ICD-10-CM | POA: Diagnosis not present

## 2021-03-28 DIAGNOSIS — L853 Xerosis cutis: Secondary | ICD-10-CM | POA: Diagnosis not present

## 2021-03-28 DIAGNOSIS — G4089 Other seizures: Secondary | ICD-10-CM | POA: Diagnosis not present

## 2021-04-05 DIAGNOSIS — J9622 Acute and chronic respiratory failure with hypercapnia: Secondary | ICD-10-CM | POA: Diagnosis not present

## 2021-04-05 DIAGNOSIS — M6281 Muscle weakness (generalized): Secondary | ICD-10-CM | POA: Diagnosis not present

## 2021-04-06 DIAGNOSIS — M6281 Muscle weakness (generalized): Secondary | ICD-10-CM | POA: Diagnosis not present

## 2021-04-06 DIAGNOSIS — J9622 Acute and chronic respiratory failure with hypercapnia: Secondary | ICD-10-CM | POA: Diagnosis not present

## 2021-04-07 DIAGNOSIS — M6281 Muscle weakness (generalized): Secondary | ICD-10-CM | POA: Diagnosis not present

## 2021-04-07 DIAGNOSIS — J9622 Acute and chronic respiratory failure with hypercapnia: Secondary | ICD-10-CM | POA: Diagnosis not present

## 2021-04-08 DIAGNOSIS — J9622 Acute and chronic respiratory failure with hypercapnia: Secondary | ICD-10-CM | POA: Diagnosis not present

## 2021-04-08 DIAGNOSIS — M6281 Muscle weakness (generalized): Secondary | ICD-10-CM | POA: Diagnosis not present

## 2021-04-11 DIAGNOSIS — R0601 Orthopnea: Secondary | ICD-10-CM | POA: Diagnosis not present

## 2021-04-11 DIAGNOSIS — L299 Pruritus, unspecified: Secondary | ICD-10-CM | POA: Diagnosis not present

## 2021-04-11 DIAGNOSIS — R0600 Dyspnea, unspecified: Secondary | ICD-10-CM | POA: Diagnosis not present

## 2021-04-12 DIAGNOSIS — M6281 Muscle weakness (generalized): Secondary | ICD-10-CM | POA: Diagnosis not present

## 2021-04-12 DIAGNOSIS — G250 Essential tremor: Secondary | ICD-10-CM | POA: Diagnosis not present

## 2021-04-12 DIAGNOSIS — J9622 Acute and chronic respiratory failure with hypercapnia: Secondary | ICD-10-CM | POA: Diagnosis not present

## 2021-04-12 DIAGNOSIS — L299 Pruritus, unspecified: Secondary | ICD-10-CM | POA: Diagnosis not present

## 2021-04-13 DIAGNOSIS — M6281 Muscle weakness (generalized): Secondary | ICD-10-CM | POA: Diagnosis not present

## 2021-04-13 DIAGNOSIS — J9622 Acute and chronic respiratory failure with hypercapnia: Secondary | ICD-10-CM | POA: Diagnosis not present

## 2021-04-14 DIAGNOSIS — M6281 Muscle weakness (generalized): Secondary | ICD-10-CM | POA: Diagnosis not present

## 2021-04-14 DIAGNOSIS — R0789 Other chest pain: Secondary | ICD-10-CM | POA: Diagnosis not present

## 2021-04-14 DIAGNOSIS — J9622 Acute and chronic respiratory failure with hypercapnia: Secondary | ICD-10-CM | POA: Diagnosis not present

## 2021-04-15 DIAGNOSIS — M6281 Muscle weakness (generalized): Secondary | ICD-10-CM | POA: Diagnosis not present

## 2021-04-15 DIAGNOSIS — J9622 Acute and chronic respiratory failure with hypercapnia: Secondary | ICD-10-CM | POA: Diagnosis not present

## 2021-04-16 DIAGNOSIS — M6281 Muscle weakness (generalized): Secondary | ICD-10-CM | POA: Diagnosis not present

## 2021-04-16 DIAGNOSIS — J9622 Acute and chronic respiratory failure with hypercapnia: Secondary | ICD-10-CM | POA: Diagnosis not present

## 2021-04-18 DIAGNOSIS — M6281 Muscle weakness (generalized): Secondary | ICD-10-CM | POA: Diagnosis not present

## 2021-04-18 DIAGNOSIS — J9622 Acute and chronic respiratory failure with hypercapnia: Secondary | ICD-10-CM | POA: Diagnosis not present

## 2021-04-19 ENCOUNTER — Emergency Department: Payer: Medicare HMO

## 2021-04-19 ENCOUNTER — Other Ambulatory Visit: Payer: Self-pay

## 2021-04-19 ENCOUNTER — Encounter: Payer: Self-pay | Admitting: Emergency Medicine

## 2021-04-19 ENCOUNTER — Inpatient Hospital Stay
Admission: EM | Admit: 2021-04-19 | Discharge: 2021-04-21 | DRG: 689 | Disposition: A | Discharge status: Skilled Nursing Facility | Payer: Medicare HMO | Source: Skilled Nursing Facility | Attending: Internal Medicine | Admitting: Internal Medicine

## 2021-04-19 ENCOUNTER — Inpatient Hospital Stay: Payer: Medicare HMO

## 2021-04-19 DIAGNOSIS — Z8616 Personal history of COVID-19: Secondary | ICD-10-CM | POA: Diagnosis not present

## 2021-04-19 DIAGNOSIS — F32A Depression, unspecified: Secondary | ICD-10-CM | POA: Diagnosis present

## 2021-04-19 DIAGNOSIS — G40909 Epilepsy, unspecified, not intractable, without status epilepticus: Secondary | ICD-10-CM | POA: Diagnosis present

## 2021-04-19 DIAGNOSIS — H9190 Unspecified hearing loss, unspecified ear: Secondary | ICD-10-CM | POA: Diagnosis not present

## 2021-04-19 DIAGNOSIS — N3000 Acute cystitis without hematuria: Secondary | ICD-10-CM | POA: Diagnosis not present

## 2021-04-19 DIAGNOSIS — B9620 Unspecified Escherichia coli [E. coli] as the cause of diseases classified elsewhere: Secondary | ICD-10-CM | POA: Diagnosis present

## 2021-04-19 DIAGNOSIS — J438 Other emphysema: Secondary | ICD-10-CM | POA: Diagnosis present

## 2021-04-19 DIAGNOSIS — J9602 Acute respiratory failure with hypercapnia: Secondary | ICD-10-CM | POA: Diagnosis not present

## 2021-04-19 DIAGNOSIS — K5909 Other constipation: Secondary | ICD-10-CM | POA: Diagnosis present

## 2021-04-19 DIAGNOSIS — Z79899 Other long term (current) drug therapy: Secondary | ICD-10-CM

## 2021-04-19 DIAGNOSIS — R4182 Altered mental status, unspecified: Secondary | ICD-10-CM | POA: Diagnosis not present

## 2021-04-19 DIAGNOSIS — I714 Abdominal aortic aneurysm, without rupture, unspecified: Secondary | ICD-10-CM | POA: Diagnosis present

## 2021-04-19 DIAGNOSIS — K219 Gastro-esophageal reflux disease without esophagitis: Secondary | ICD-10-CM | POA: Diagnosis present

## 2021-04-19 DIAGNOSIS — J432 Centrilobular emphysema: Secondary | ICD-10-CM | POA: Diagnosis present

## 2021-04-19 DIAGNOSIS — Z6841 Body Mass Index (BMI) 40.0 and over, adult: Secondary | ICD-10-CM

## 2021-04-19 DIAGNOSIS — Z8249 Family history of ischemic heart disease and other diseases of the circulatory system: Secondary | ICD-10-CM

## 2021-04-19 DIAGNOSIS — E559 Vitamin D deficiency, unspecified: Secondary | ICD-10-CM | POA: Diagnosis present

## 2021-04-19 DIAGNOSIS — J9621 Acute and chronic respiratory failure with hypoxia: Secondary | ICD-10-CM | POA: Diagnosis not present

## 2021-04-19 DIAGNOSIS — Z20822 Contact with and (suspected) exposure to covid-19: Secondary | ICD-10-CM | POA: Diagnosis present

## 2021-04-19 DIAGNOSIS — I69320 Aphasia following cerebral infarction: Secondary | ICD-10-CM | POA: Diagnosis not present

## 2021-04-19 DIAGNOSIS — I10 Essential (primary) hypertension: Secondary | ICD-10-CM | POA: Diagnosis not present

## 2021-04-19 DIAGNOSIS — I130 Hypertensive heart and chronic kidney disease with heart failure and stage 1 through stage 4 chronic kidney disease, or unspecified chronic kidney disease: Secondary | ICD-10-CM | POA: Diagnosis present

## 2021-04-19 DIAGNOSIS — H0520 Unspecified exophthalmos: Secondary | ICD-10-CM | POA: Diagnosis not present

## 2021-04-19 DIAGNOSIS — E662 Morbid (severe) obesity with alveolar hypoventilation: Secondary | ICD-10-CM | POA: Diagnosis not present

## 2021-04-19 DIAGNOSIS — E8729 Other acidosis: Secondary | ICD-10-CM | POA: Diagnosis not present

## 2021-04-19 DIAGNOSIS — E1159 Type 2 diabetes mellitus with other circulatory complications: Secondary | ICD-10-CM | POA: Diagnosis present

## 2021-04-19 DIAGNOSIS — Z7401 Bed confinement status: Secondary | ICD-10-CM | POA: Diagnosis not present

## 2021-04-19 DIAGNOSIS — M20039 Swan-neck deformity of unspecified finger(s): Secondary | ICD-10-CM | POA: Diagnosis present

## 2021-04-19 DIAGNOSIS — J9601 Acute respiratory failure with hypoxia: Secondary | ICD-10-CM | POA: Diagnosis not present

## 2021-04-19 DIAGNOSIS — Z811 Family history of alcohol abuse and dependence: Secondary | ICD-10-CM

## 2021-04-19 DIAGNOSIS — Z87891 Personal history of nicotine dependence: Secondary | ICD-10-CM

## 2021-04-19 DIAGNOSIS — J431 Panlobular emphysema: Secondary | ICD-10-CM

## 2021-04-19 DIAGNOSIS — G9341 Metabolic encephalopathy: Secondary | ICD-10-CM | POA: Diagnosis present

## 2021-04-19 DIAGNOSIS — I5032 Chronic diastolic (congestive) heart failure: Secondary | ICD-10-CM | POA: Diagnosis not present

## 2021-04-19 DIAGNOSIS — I6782 Cerebral ischemia: Secondary | ICD-10-CM | POA: Diagnosis not present

## 2021-04-19 DIAGNOSIS — Z96651 Presence of right artificial knee joint: Secondary | ICD-10-CM | POA: Diagnosis present

## 2021-04-19 DIAGNOSIS — I2510 Atherosclerotic heart disease of native coronary artery without angina pectoris: Secondary | ICD-10-CM | POA: Diagnosis present

## 2021-04-19 DIAGNOSIS — Z794 Long term (current) use of insulin: Secondary | ICD-10-CM

## 2021-04-19 DIAGNOSIS — N390 Urinary tract infection, site not specified: Principal | ICD-10-CM | POA: Diagnosis present

## 2021-04-19 DIAGNOSIS — J3489 Other specified disorders of nose and nasal sinuses: Secondary | ICD-10-CM | POA: Diagnosis not present

## 2021-04-19 DIAGNOSIS — I5042 Chronic combined systolic (congestive) and diastolic (congestive) heart failure: Secondary | ICD-10-CM | POA: Diagnosis present

## 2021-04-19 DIAGNOSIS — N1832 Chronic kidney disease, stage 3b: Secondary | ICD-10-CM | POA: Diagnosis not present

## 2021-04-19 DIAGNOSIS — J9622 Acute and chronic respiratory failure with hypercapnia: Secondary | ICD-10-CM | POA: Diagnosis not present

## 2021-04-19 DIAGNOSIS — E869 Volume depletion, unspecified: Secondary | ICD-10-CM | POA: Diagnosis present

## 2021-04-19 DIAGNOSIS — H02401 Unspecified ptosis of right eyelid: Secondary | ICD-10-CM | POA: Diagnosis present

## 2021-04-19 DIAGNOSIS — E785 Hyperlipidemia, unspecified: Secondary | ICD-10-CM | POA: Diagnosis not present

## 2021-04-19 DIAGNOSIS — E1122 Type 2 diabetes mellitus with diabetic chronic kidney disease: Secondary | ICD-10-CM | POA: Diagnosis present

## 2021-04-19 DIAGNOSIS — E118 Type 2 diabetes mellitus with unspecified complications: Secondary | ICD-10-CM | POA: Diagnosis not present

## 2021-04-19 DIAGNOSIS — Z9049 Acquired absence of other specified parts of digestive tract: Secondary | ICD-10-CM

## 2021-04-19 DIAGNOSIS — G319 Degenerative disease of nervous system, unspecified: Secondary | ICD-10-CM | POA: Diagnosis not present

## 2021-04-19 DIAGNOSIS — J449 Chronic obstructive pulmonary disease, unspecified: Secondary | ICD-10-CM | POA: Diagnosis not present

## 2021-04-19 DIAGNOSIS — R5381 Other malaise: Secondary | ICD-10-CM | POA: Diagnosis not present

## 2021-04-19 DIAGNOSIS — I517 Cardiomegaly: Secondary | ICD-10-CM | POA: Diagnosis not present

## 2021-04-19 DIAGNOSIS — Z888 Allergy status to other drugs, medicaments and biological substances status: Secondary | ICD-10-CM

## 2021-04-19 DIAGNOSIS — E1169 Type 2 diabetes mellitus with other specified complication: Secondary | ICD-10-CM | POA: Diagnosis present

## 2021-04-19 DIAGNOSIS — Z7984 Long term (current) use of oral hypoglycemic drugs: Secondary | ICD-10-CM

## 2021-04-19 DIAGNOSIS — N179 Acute kidney failure, unspecified: Secondary | ICD-10-CM | POA: Diagnosis present

## 2021-04-19 DIAGNOSIS — Z9981 Dependence on supplemental oxygen: Secondary | ICD-10-CM

## 2021-04-19 DIAGNOSIS — J441 Chronic obstructive pulmonary disease with (acute) exacerbation: Secondary | ICD-10-CM | POA: Diagnosis not present

## 2021-04-19 DIAGNOSIS — R0689 Other abnormalities of breathing: Secondary | ICD-10-CM

## 2021-04-19 DIAGNOSIS — Z832 Family history of diseases of the blood and blood-forming organs and certain disorders involving the immune mechanism: Secondary | ICD-10-CM

## 2021-04-19 DIAGNOSIS — F419 Anxiety disorder, unspecified: Secondary | ICD-10-CM | POA: Diagnosis present

## 2021-04-19 DIAGNOSIS — Z91030 Bee allergy status: Secondary | ICD-10-CM

## 2021-04-19 HISTORY — DX: Acute respiratory failure with hypoxia: J96.01

## 2021-04-19 HISTORY — DX: Acute respiratory failure with hypoxia: J96.02

## 2021-04-19 LAB — CBC WITH DIFFERENTIAL/PLATELET
Abs Immature Granulocytes: 0.01 10*3/uL (ref 0.00–0.07)
Basophils Absolute: 0 10*3/uL (ref 0.0–0.1)
Basophils Relative: 0 %
Eosinophils Absolute: 0.2 10*3/uL (ref 0.0–0.5)
Eosinophils Relative: 4 %
HCT: 40.7 % (ref 36.0–46.0)
Hemoglobin: 12.3 g/dL (ref 12.0–15.0)
Immature Granulocytes: 0 %
Lymphocytes Relative: 41 %
Lymphs Abs: 2.1 10*3/uL (ref 0.7–4.0)
MCH: 30 pg (ref 26.0–34.0)
MCHC: 30.2 g/dL (ref 30.0–36.0)
MCV: 99.3 fL (ref 80.0–100.0)
Monocytes Absolute: 0.4 10*3/uL (ref 0.1–1.0)
Monocytes Relative: 8 %
Neutro Abs: 2.4 10*3/uL (ref 1.7–7.7)
Neutrophils Relative %: 47 %
Platelets: 165 10*3/uL (ref 150–400)
RBC: 4.1 MIL/uL (ref 3.87–5.11)
RDW: 12.9 % (ref 11.5–15.5)
WBC: 5.2 10*3/uL (ref 4.0–10.5)
nRBC: 0 % (ref 0.0–0.2)

## 2021-04-19 LAB — MRSA NEXT GEN BY PCR, NASAL: MRSA by PCR Next Gen: DETECTED — AB

## 2021-04-19 LAB — BLOOD GAS, ARTERIAL
Acid-Base Excess: 6 mmol/L — ABNORMAL HIGH (ref 0.0–2.0)
Bicarbonate: 35.8 mmol/L — ABNORMAL HIGH (ref 20.0–28.0)
O2 Content: 2 L/min
O2 Saturation: 98.3 %
Patient temperature: 37
pCO2 arterial: 78 mmHg (ref 32–48)
pH, Arterial: 7.27 — ABNORMAL LOW (ref 7.35–7.45)
pO2, Arterial: 87 mmHg (ref 83–108)

## 2021-04-19 LAB — COMPREHENSIVE METABOLIC PANEL
ALT: 11 U/L (ref 0–44)
AST: 15 U/L (ref 15–41)
Albumin: 3.2 g/dL — ABNORMAL LOW (ref 3.5–5.0)
Alkaline Phosphatase: 96 U/L (ref 38–126)
Anion gap: 6 (ref 5–15)
BUN: 35 mg/dL — ABNORMAL HIGH (ref 8–23)
CO2: 32 mmol/L (ref 22–32)
Calcium: 9.4 mg/dL (ref 8.9–10.3)
Chloride: 106 mmol/L (ref 98–111)
Creatinine, Ser: 2.2 mg/dL — ABNORMAL HIGH (ref 0.44–1.00)
GFR, Estimated: 22 mL/min — ABNORMAL LOW (ref >60)
Glucose, Bld: 105 mg/dL — ABNORMAL HIGH (ref 70–99)
Potassium: 5 mmol/L (ref 3.5–5.1)
Sodium: 144 mmol/L (ref 135–145)
Total Bilirubin: 0.3 mg/dL (ref 0.3–1.2)
Total Protein: 7.2 g/dL (ref 6.5–8.1)

## 2021-04-19 LAB — BLOOD GAS, VENOUS
Acid-Base Excess: 5.9 mmol/L — ABNORMAL HIGH (ref 0.0–2.0)
Bicarbonate: 38.1 mmol/L — ABNORMAL HIGH (ref 20.0–28.0)
O2 Saturation: 61.6 %
Patient temperature: 37
pCO2, Ven: 102 mmHg (ref 44–60)
pH, Ven: 7.18 — CL (ref 7.25–7.43)
pO2, Ven: 41 mmHg (ref 32–45)

## 2021-04-19 LAB — SEDIMENTATION RATE: Sed Rate: 43 mm/hr — ABNORMAL HIGH (ref 0–30)

## 2021-04-19 LAB — URINALYSIS, ROUTINE W REFLEX MICROSCOPIC
Bilirubin Urine: NEGATIVE
Glucose, UA: NEGATIVE mg/dL
Ketones, ur: NEGATIVE mg/dL
Nitrite: NEGATIVE
Protein, ur: 30 mg/dL — AB
RBC / HPF: >50 RBC/hpf — ABNORMAL HIGH (ref 0–5)
Specific Gravity, Urine: 1.013 (ref 1.005–1.030)
Squamous Epithelial / HPF: NONE SEEN (ref 0–5)
WBC, UA: >50 WBC/hpf — ABNORMAL HIGH (ref 0–5)
pH: 5 (ref 5.0–8.0)

## 2021-04-19 LAB — GLUCOSE, CAPILLARY
Glucose-Capillary: 87 mg/dL (ref 70–99)
Glucose-Capillary: 104 mg/dL — ABNORMAL HIGH (ref 70–99)

## 2021-04-19 LAB — BRAIN NATRIURETIC PEPTIDE: B Natriuretic Peptide: 52.3 pg/mL (ref 0.0–100.0)

## 2021-04-19 LAB — LACTIC ACID, PLASMA: Lactic Acid, Venous: 0.9 mmol/L (ref 0.5–1.9)

## 2021-04-19 LAB — RESP PANEL BY RT-PCR (FLU A&B, COVID) ARPGX2
Influenza A by PCR: NEGATIVE
Influenza B by PCR: NEGATIVE
SARS Coronavirus 2 by RT PCR: NEGATIVE

## 2021-04-19 LAB — TROPONIN I (HIGH SENSITIVITY): Troponin I (High Sensitivity): 9 ng/L (ref <18)

## 2021-04-19 LAB — CBG MONITORING, ED: Glucose-Capillary: 99 mg/dL (ref 70–99)

## 2021-04-19 MED ORDER — FLUTICASONE-UMECLIDIN-VILANT 100-62.5-25 MCG/INH IN AEPB: 1.0000 | INHALATION_SPRAY | Freq: Every day | RESPIRATORY_TRACT | Status: DC

## 2021-04-19 MED ORDER — SODIUM CHLORIDE 0.9 % IV SOLN
2.0000 g | INTRAVENOUS | Status: DC
  Administered 2021-04-20 – 2021-04-21 (×2): 2 g via INTRAVENOUS
  Filled 2021-04-19 (×2): qty 2

## 2021-04-19 MED ORDER — SODIUM CHLORIDE 0.9% FLUSH
3.0000 mL | Freq: Two times a day (BID) | INTRAVENOUS | Status: DC
  Administered 2021-04-19 – 2021-04-21 (×4): 3 mL via INTRAVENOUS

## 2021-04-19 MED ORDER — LACTATED RINGERS IV SOLN: INTRAVENOUS | Status: DC

## 2021-04-19 MED ORDER — IPRATROPIUM-ALBUTEROL 0.5-2.5 (3) MG/3ML IN SOLN: 3.0000 mL | Freq: Four times a day (QID) | RESPIRATORY_TRACT | Status: DC | PRN

## 2021-04-19 MED ORDER — CHLORHEXIDINE GLUCONATE CLOTH 2 % EX PADS
6.0000 | MEDICATED_PAD | Freq: Every day | CUTANEOUS | Status: DC
  Administered 2021-04-19 – 2021-04-20 (×2): 6 via TOPICAL

## 2021-04-19 MED ORDER — LACTATED RINGERS IV BOLUS
1000.0000 mL | Freq: Once | INTRAVENOUS | Status: AC
  Administered 2021-04-19: 1000 mL via INTRAVENOUS

## 2021-04-19 MED ORDER — HALOPERIDOL LACTATE 5 MG/ML IJ SOLN
2.0000 mg | Freq: Four times a day (QID) | INTRAMUSCULAR | Status: DC | PRN
  Administered 2021-04-19: 2 mg via INTRAVENOUS
  Filled 2021-04-19: qty 1

## 2021-04-19 MED ORDER — SODIUM CHLORIDE 0.9 % IV SOLN
1.0000 g | Freq: Once | INTRAVENOUS | Status: AC
  Administered 2021-04-19: 1 g via INTRAVENOUS
  Filled 2021-04-19: qty 10

## 2021-04-19 MED ORDER — INSULIN ASPART 100 UNIT/ML IJ SOLN: 0.0000 [IU] | Freq: Three times a day (TID) | INTRAMUSCULAR | Status: DC

## 2021-04-19 MED ORDER — ONDANSETRON HCL 4 MG/2ML IJ SOLN
4.0000 mg | Freq: Four times a day (QID) | INTRAMUSCULAR | Status: DC | PRN
Start: 2021-04-19 — End: 2021-04-21

## 2021-04-19 MED ORDER — UMECLIDINIUM BROMIDE 62.5 MCG/ACT IN AEPB
1.0000 | INHALATION_SPRAY | Freq: Every day | RESPIRATORY_TRACT | Status: DC
  Administered 2021-04-20 – 2021-04-21 (×2): 1 via RESPIRATORY_TRACT
  Filled 2021-04-19 (×2): qty 7

## 2021-04-19 MED ORDER — ACETAMINOPHEN 650 MG RE SUPP: 650.0000 mg | Freq: Four times a day (QID) | RECTAL | Status: DC | PRN

## 2021-04-19 MED ORDER — SODIUM CHLORIDE 0.9 % IV SOLN
750.0000 mg | Freq: Two times a day (BID) | INTRAVENOUS | Status: DC
  Administered 2021-04-19 – 2021-04-21 (×4): 750 mg via INTRAVENOUS
  Filled 2021-04-19 (×5): qty 7.5

## 2021-04-19 MED ORDER — LACOSAMIDE 50 MG PO TABS
100.0000 mg | ORAL_TABLET | Freq: Two times a day (BID) | ORAL | Status: DC
  Administered 2021-04-19: 100 mg via ORAL
  Filled 2021-04-19: qty 2

## 2021-04-19 MED ORDER — MUPIROCIN 2 % EX OINT
TOPICAL_OINTMENT | Freq: Two times a day (BID) | CUTANEOUS | Status: DC
  Administered 2021-04-19 – 2021-04-20 (×2): 1 via NASAL
  Filled 2021-04-19: qty 22

## 2021-04-19 MED ORDER — LEVETIRACETAM 750 MG PO TABS
750.0000 mg | ORAL_TABLET | Freq: Two times a day (BID) | ORAL | Status: DC
  Administered 2021-04-19: 750 mg via ORAL
  Filled 2021-04-19 (×2): qty 1

## 2021-04-19 MED ORDER — SODIUM CHLORIDE 0.9 % IV SOLN
100.0000 mg | Freq: Two times a day (BID) | INTRAVENOUS | Status: DC
  Administered 2021-04-19 – 2021-04-21 (×4): 100 mg via INTRAVENOUS
  Filled 2021-04-19 (×5): qty 10

## 2021-04-19 MED ORDER — INSULIN ASPART 100 UNIT/ML IJ SOLN: 0.0000 [IU] | Freq: Every day | INTRAMUSCULAR | Status: DC

## 2021-04-19 MED ORDER — ACETAMINOPHEN 325 MG PO TABS: 650.0000 mg | ORAL_TABLET | Freq: Four times a day (QID) | ORAL | Status: DC | PRN

## 2021-04-19 MED ORDER — ONDANSETRON HCL 4 MG PO TABS: 4.0000 mg | ORAL_TABLET | Freq: Four times a day (QID) | ORAL | Status: DC | PRN

## 2021-04-19 MED ORDER — FLUTICASONE FUROATE-VILANTEROL 100-25 MCG/ACT IN AEPB
1.0000 | INHALATION_SPRAY | Freq: Every day | RESPIRATORY_TRACT | Status: DC
  Administered 2021-04-20 – 2021-04-21 (×2): 1 via RESPIRATORY_TRACT
  Filled 2021-04-19 (×2): qty 28

## 2021-04-19 MED ORDER — HEPARIN SODIUM (PORCINE) 5000 UNIT/ML IJ SOLN
5000.0000 [IU] | Freq: Three times a day (TID) | INTRAMUSCULAR | Status: DC
  Administered 2021-04-19 – 2021-04-20 (×5): 5000 [IU] via SUBCUTANEOUS
  Filled 2021-04-19 (×5): qty 1

## 2021-04-19 MED ORDER — HYDRALAZINE HCL 20 MG/ML IJ SOLN: 5.0000 mg | INTRAMUSCULAR | Status: DC | PRN

## 2021-04-19 NOTE — ED Notes (Signed)
Pt set up for lunch, pt refuses assistance.  ?VBG sent to lab, RT aware.  ? ? ?

## 2021-04-19 NOTE — ED Notes (Signed)
RT called for transport

## 2021-04-19 NOTE — ED Triage Notes (Signed)
Pt presents from [facility] with complaints of AMS. The patient was last seen at [place] on 04/18/21 asleep in bed - prior to that the patient was A&Ox4 and communicating with staff. This morning, per facility, the patient was staring off and non-responsive to their conversations. Pt only making yes and no phrases at this time and follows commands. NIH 0 - Pt nonambulatory at baseline. Denies CP. ?

## 2021-04-19 NOTE — Evaluation (Signed)
Clinical/Bedside Swallow Evaluation Patient Details  Name: Katherine [name] MRN: [id number] Date of Birth: Jan 15, 1941  Today's Date: 04/19/2021 Time: SLP Start Time (ACUTE ONLY): 56 SLP Stop Time (ACUTE ONLY): 1125 SLP Time Calculation (min) (ACUTE ONLY): 55 min  Past Medical History:  Past Medical History:  Diagnosis Date   Angioedema    Anxiety and depression    CHF (congestive heart failure) (HCC)    Chronic constipation    Chronic kidney disease    stage 3   COPD (chronic obstructive pulmonary disease) (HCC)    Diabetes mellitus without complication ([name])    Gallstones    GERD (gastroesophageal reflux disease)    Hypertension    Left ventricular hypertrophy    Osteoarthritis    Prurigo nodularis    Seizures ([place])    Stroke ([place])    Tobacco abuse    Vitamin D deficiency disease    Past Surgical History:  Past Surgical History:  Procedure Laterality Date   CHOLECYSTECTOMY     POLYPECTOMY  11/2011   vocal cord   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   Procedure: TOTAL KNEE ARTHROPLASTY;  Surgeon: Hessie Knows, MD;  Location: [facility] ORS;  Service: Orthopedics;  Laterality: Right;   HPI:  Pt  is a 81 y.o. female with medical history of Multiple medical issues including Seizures, stroke, GERD, HOH, T2DM, stage IIIb CKD, HTN, Obesity, chronic HFpEF, chronic hypoxic respiratory failure on 2L O2, COPD, UTI w/ encephalopathy who presented from SNF with decreased responsiveness.  Uncertain of Baseline Ambulatory status.  She had been known well last night around 6:30pm, and was found to be unresponsive on nursing rounds at 4:42am. She was gurgling, nonverbal, unable to answer questions on arrival to the ED. CT of the head showed no hemorrhage or other acute changes on background of cerebral atrophy. ABG revealed respiratory acidosis. CXR without acute changes, suggestive of interstitial lung disease. WBC normal, lactic acid normal, SCr 2.2 slightly above baseline, and urinalysis shows  significant pyuria. Troponin and BNP wnl. IV fluid and ceftriaxone 1g was administered as hospitalists were consulted for admission.  At evaluation in the ED, the patient is verbal, able to provide some history including recent nausea and vomiting. No diarrhea, abdominal pain, fever, chills. She wears 2L O2 all the time, doesn't have any recent dyspnea, chest pain, palpitations.   MRI: No evidence of acute intracranial abnormality.   Moderate chronic small vessel ischemic changes within the cerebral  white matter and pons. Chronic small vessel ischemic changes are  also present within the right basal ganglia.  CXR: could suggests potential interstitial  lung disease. Further evaluation with high-resolution chest CT is  recommended in the near future to better evaluate these findings; cardiomegaly.    Assessment / Plan / Recommendation  Clinical Impression  Pt seen for BSE today. She required Min-Mod cues and positioning upright on bed to fully alert/awaken to participate in po tasks. She often perseverated on the need to "feed myself" though she did not present w/ full awareness and engagement to do so. Also noted weakness/mild shakiness of UEs bilaterally. Unsure of pt's Baseline Cognitive status at NH where she resides. Unsure of her full awareness and comprehension of aspiration precautions for follow through. Suspect she will need Supervision at meals in general.   Pt appears to present w/ grossly functional oropharyngeal phase swallow function; appears to have Mild+ oral phase challenges masticating solids d/t her Baseline Edentulous status and declined Cognitive awareness currently. W/ pt's Baseline issues of  EDENTULOUS status, GERD, and COPD, these issues can impact swallowing/breathing timing AND ability to effectively masticate/chew solid foods - especially meats, breads. Pt would benefit from a modified diet(foods) consistency and general aspiration precautions w/ Supervision and feeding support at  meals.   Pt consumed trials of thin liquids via Straw using small, single sips, then purees and minced solid food trials w/ no overt s/s of aspiration; no decline in vocal quality or respiratory presentation during/post trials. O2 sats remained 98%. During the Oral phase, pt needed increased time to effectively chew/mash/gum the more minced/solid boluses b/f A-P transfer/swallow. Given increased time, pt was able to accomplish swallowing/clearing of minced/solid bolus trials. Pt educated on taking rest breaks as well to reduce any SOB w/ any exertion of the tasks of eating/drinking. Pt supported w/ feeding self; noted shaky UEs.   OM exam appeared grossly WFL -- though noted a min tongue forward position w/ bunching behavior. Husband stated he had seen this at the NH in the past few days also. This can be neurological in nature.  Of note, pt has c/o GI dysmotility/discomfort in past and stated the food and liquids "TRY TO COME BACK UP SOMETIMES". Noted min BELCHING post trials this session. Pt has baseline of GERD.   Recommend a Dysphagia level 2 (minced foods d/t edentulous status and illness currently); Thin liquids. Moisten foods well. General aspiration precautions, Reflux precautions d/t GERD dx. Recommend Pills w/ Puree for safer swallowing - Crushed if needed. Reduce distractions during meals; Rest Breaks during meals. Supervision at meals w/ feeding support as needed.   D/t unknown Cognitive status at Baseline, Supervision at meals is indicated for follow through cues w/ strategies/precautions/foods. Per a previous Cognitive-linguistic eval in 09/2017 at another facility, pt presented w/ reduced sustained attention, basic to mildly complex comprehension, confrontational and divergent naming, and intellectual awareness per the assessment. SLP Visit Diagnosis: Dysphagia, oral phase (R13.11) (edentulous status baseline; unsure of Cognitive status at baseline)    Aspiration Risk  Mild aspiration  risk;Risk for inadequate nutrition/hydration (reduced following general precautions)    Diet Recommendation   Dysphagia level 2 (minced foods d/t edentulous status and illness currently); Thin liquids. Moisten foods well. General aspiration precautions, Reflux precautions d/t GERD dx. Reduce distractions during meals; Rest Breaks during meals. Supervision at meals w/ feeding support as needed.   Medication Administration: Crushed with puree (vs Whole in puree)    Other  Recommendations Recommended Consults:  (Dietician f/u) Oral Care Recommendations: Oral care BID;Oral care before and after PO;Staff/trained caregiver to provide oral care (support) Other Recommendations:  (n/a)    Recommendations for follow up therapy are one component of a multi-disciplinary discharge planning process, led by the attending physician.  Recommendations may be updated based on patient status, additional functional criteria and insurance authorization.  Follow up Recommendations Follow physician's recommendations for discharge plan and follow up therapies      Assistance Recommended at Discharge Frequent or constant Supervision/Assistance  Functional Status Assessment Patient has had a recent decline in their functional status and/or demonstrates limited ability to make significant improvements in function in a reasonable and predictable amount of time  Frequency and Duration min 1 x/week  1 week       Prognosis Prognosis for Safe Diet Advancement: Fair Barriers to Reach Goals: Time post onset;Severity of deficits;Behavior Barriers/Prognosis Comment: unsure of pt's full awareness and comprehension for follow through w/ precautions      Swallow Study   General Date of Onset: 04/19/21 HPI:  Pt  is a 81 y.o. female with medical history of Multiple medical issues including Seizures, stroke, GERD, HOH, T2DM, stage IIIb CKD, HTN, Obesity, chronic HFpEF, chronic hypoxic respiratory failure on 2L O2, COPD, UTI w/  encephalopathy who presented from SNF with decreased responsiveness.  Uncertain of Baseline Ambulatory status.  She had been known well last night around 6:30pm, and was found to be unresponsive on nursing rounds at 4:42am. She was gurgling, nonverbal, unable to answer questions on arrival to the ED. CT of the head showed no hemorrhage or other acute changes on background of cerebral atrophy. ABG revealed respiratory acidosis. CXR without acute changes, suggestive of interstitial lung disease. WBC normal, lactic acid normal, SCr 2.2 slightly above baseline, and urinalysis shows significant pyuria. Troponin and BNP wnl. IV fluid and ceftriaxone 1g was administered as hospitalists were consulted for admission.  At evaluation in the ED, the patient is verbal, able to provide some history including recent nausea and vomiting. No diarrhea, abdominal pain, fever, chills. She wears 2L O2 all the time, doesn't have any recent dyspnea, chest pain, palpitations.   MRI: No evidence of acute intracranial abnormality.     Moderate chronic small vessel ischemic changes within the cerebral  white matter and pons. Chronic small vessel ischemic changes are  also present within the right basal ganglia.  CXR: could suggests potential interstitial  lung disease. Further evaluation with high-resolution chest CT is  recommended in the near future to better evaluate these findings; cardiomegaly. Type of Study: Bedside Swallow Evaluation Previous Swallow Assessment: 2020 Diet Prior to this Study: Dysphagia 3 (soft);Thin liquids Temperature Spikes Noted: No (wbc 5.2) Respiratory Status: Nasal cannula (3L) History of Recent Intubation: No Behavior/Cognition: Alert;Cooperative;Pleasant mood;Confused;Distractible;Requires cueing (perseverated mildly on being able to feed herself; HOH) Oral Cavity Assessment: Excessive secretions (saliva; tongue forward position(min)) Oral Care Completed by SLP: Yes Oral Cavity - Dentition: Edentulous  (baseline) Vision: Functional for self-feeding (appears) Self-Feeding Abilities: Able to feed self;Needs assist;Needs set up (weak UEs) Patient Positioning: Upright in bed (needed full positioning support) Baseline Vocal Quality: Normal Volitional Cough: Cognitively unable to elicit Volitional Swallow: Unable to elicit    Oral/Motor/Sensory Function Overall Oral Motor/Sensory Function: Mild impairment (tongue forward positiong - min) Facial ROM: Within Functional Limits Facial Symmetry: Within Functional Limits Facial Strength: Within Functional Limits Lingual ROM: Within Functional Limits Lingual Symmetry: Within Functional Limits Lingual Strength: Within Functional Limits Mandible: Within Functional Limits   Ice Chips Ice chips: Within functional limits Presentation: Spoon (fed; 3 trials)   Thin Liquid Thin Liquid: Within functional limits Presentation: Self Fed;Straw (supported; 15 trials) Other Comments: she took small, single sips only allowing a little in her mouth at a time -- lingual bunching noted    Nectar Thick Nectar Thick Liquid: Not tested   Honey Thick Honey Thick Liquid: Not tested   Puree Puree: Within functional limits Presentation: Spoon (fed; 10 trials) Other Comments: similar presentation as w/ the liquids   Solid     Solid: Impaired (min) Presentation: Spoon (fed; 8 trials) Oral Phase Impairments: Impaired mastication (edentulous; tongue forward position - min) Oral Phase Functional Implications: Prolonged oral transit;Impaired mastication (munching) Pharyngeal Phase Impairments:  (none)         Orinda Kenner, MS, CCC-SLP Speech Language Pathologist Rehab Services; [place] [phone] (ascom) Jullisa Grigoryan 04/19/2021,4:19 PM

## 2021-04-19 NOTE — H&P (Signed)
History and Physical    Patient: Katherine [name] KKX:[id number] DOB: Jun 27, 1940 DOA: 04/19/2021 DOS: the patient was seen and examined on 04/19/2021 PCP: [facility] [place]  Patient coming from: SNF  Chief Complaint:  Chief Complaint  Patient presents with   Altered Mental Status   HPI: Katherine [name] is a 81 y.o. female with medical history of T2DM, stage IIIb CKD, HTN, CVA, chronic HFpEF, chronic hypoxic respiratory failure on 2L O2, COPD who presented from SNF with decreased responsiveness. She had been known well last night around 6:30pm, and was found to be unresponsive on nursing rounds at 4:42am. She was gurgling, nonverbal, unable to answer questions on arrival to the ED. CT of the head showed no hemorrhage or other acute changes on background of cerebral atrophy. ABG revealed respiratory acidosis. CXR without acute changes, suggestive of interstitial lung disease. WBC normal, lactic acid normal, SCr 2.2 slightly above baseline, and urinalysis shows significant pyuria. Troponin and BNP wnl. IV fluid and ceftriaxone 1g was administered as hospitalists were consulted for admission.   On my evaluation, the patient is verbal, able to provide some history including recent nausea and vomiting. No diarrhea, abdominal pain, fever, chills. She wears 2L O2 all the time, doesn't have any recent dyspnea, chest pain, palpitations. Gets UTI that causes encephalopathy in the past, unable to say whether it burns when she urinates.   Repeat ABG pending.  Review of Systems: As mentioned in the history of present illness. All other systems reviewed within the patient's ability to accurately answer, and are negative. Past Medical History:  Diagnosis Date   Angioedema    Anxiety and depression    CHF (congestive heart failure) (HCC)    Chronic constipation    Chronic kidney disease    stage 3   COPD (chronic obstructive pulmonary disease) (HCC)    Diabetes mellitus without complication (HCC)     Gallstones    GERD (gastroesophageal reflux disease)    Hypertension    Left ventricular hypertrophy    Osteoarthritis    Prurigo nodularis    Seizures ([place])    Stroke ([place])    Tobacco abuse    Vitamin D deficiency disease    Past Surgical History:  Procedure Laterality Date   CHOLECYSTECTOMY     POLYPECTOMY  11/2011   vocal cord   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   Procedure: TOTAL KNEE ARTHROPLASTY;  Surgeon: Hessie Knows, MD;  Location: [facility] ORS;  Service: Orthopedics;  Laterality: Right;   Social History:  reports that she has quit smoking. Her smoking use included cigarettes. She has a 30.00 pack-year smoking history. She has never used smokeless tobacco. She reports that she does not drink alcohol and does not use drugs.  Allergies  Allergen Reactions   Bee Venom Swelling   Enalapril Maleate Swelling    Family History  Problem Relation Age of Onset   Alcohol abuse Mother    Sickle cell anemia Daughter    Hypertension Son    Cancer Neg Hx    COPD Neg Hx    Diabetes Neg Hx    Heart disease Neg Hx    Stroke Neg Hx     Prior to Admission medications   Medication Sig Start Date End Date Taking? Authorizing Provider  acetaminophen (TYLENOL) 650 MG CR tablet Take 650 mg by mouth 3 (three) times daily.    [provider]  albuterol (ACCUNEB) 0.63 MG/3ML nebulizer solution Take 3 mLs by nebulization every 8 (eight) hours as  needed for wheezing.     [provider]  atorvastatin (LIPITOR) 80 MG tablet Take 80 mg by mouth every evening.     [provider]  cetirizine (ZYRTEC) 10 MG tablet Take 10 mg by mouth daily.     [provider]  docusate sodium (COLACE) 100 MG capsule Take 100 mg by mouth 2 (two) times daily.    [provider]  ferrous sulfate 325 (65 FE) MG tablet Take 325 mg by mouth 2 (two) times daily.    [provider]  Fluticasone-Umeclidin-Vilant 100-62.5-25 MCG/INH AEPB Inhale 1 puff into the  lungs daily.    [provider]  gabapentin (NEURONTIN) 300 MG capsule Take 300 mg by mouth at bedtime.    [provider]  ipratropium-albuterol (DUONEB) 0.5-2.5 (3) MG/3ML SOLN Take 3 mLs by nebulization 3 (three) times daily. 07/17/18   Gladstone Lighter, MD  lacosamide 100 MG TABS Take 1 tablet (100 mg total) by mouth 2 (two) times daily. 12/31/18   Thurnell Lose, MD  levETIRAcetam (KEPPRA) 750 MG tablet Take 1 tablet (750 mg total) by mouth 2 (two) times daily. 12/30/19   Wouk, Ailene Rud, MD  lidocaine (LIDODERM) 5 % 1 patch daily. 01/06/21   [provider]  linagliptin (TRADJENTA) 5 MG TABS tablet Take 5 mg by mouth daily.    [provider]  LORazepam (ATIVAN) 2 MG/ML injection Inject 0.5 mg into the vein daily as needed for seizure.    [provider]  Menthol, Topical Analgesic, (BIOFREEZE) 4 % GEL Apply 1 application topically in the morning and at bedtime. To both knees     [provider]  mupirocin ointment (BACTROBAN) 2 % Place 1 application into the nose 2 (two) times daily. 06/13/18   Loletha Grayer, MD  nitroGLYCERIN (NITROSTAT) 0.4 MG SL tablet Place 0.4 mg under the tongue every 5 (five) minutes as needed for chest pain.  08/07/18   [provider]  pantoprazole (PROTONIX) 40 MG tablet Take 1 tablet (40 mg total) by mouth daily. 04/29/18   Bettey Costa, MD  Saccharomyces boulardii (PROBIOTIC) 250 MG CAPS Take 1 capsule by mouth 2 times daily at 12 noon and 4 pm. Patient taking differently: Take 1 capsule by mouth 2 (two) times daily. 04/05/19   Merlyn Lot, MD  Saline GEL Place 3 sprays into both nostrils 3 (three) times daily.     [provider]  sertraline (ZOLOFT) 25 MG tablet Take 25 mg by mouth daily. 12/18/20   [provider]  vitamin C (VITAMIN C) 500 MG tablet Take 1 tablet (500 mg total) by mouth daily. 01/12/19   Allie Bossier, MD  zinc sulfate 220 (50 Zn) MG capsule Take 1  capsule (220 mg total) by mouth daily. 01/12/19   Allie Bossier, MD    Physical Exam: Vitals:   04/19/21 0534 04/19/21 0538 04/19/21 0630 04/19/21 0700  BP: (!) 153/91  (!) 124/54 (!) 110/93  Pulse: 69  67 68  Resp: 20  20 (!) 22  Temp: 98.1 F (36.7 C)     TempSrc: Oral     SpO2: 100%  100% 100%  Weight:  118.1 kg    Height:  5\' 4"  (1.626 m)    Gen: 81 y.o. female in no distress Pulm: Nonlabored breathing supplemental oxygen. Clear, diminished, no wheezes. CV: Regular rate and rhythm. No murmur, rub, or gallop. No definite JVD, no pitting dependent edema. GI: Abdomen soft, obese, slight suprapubic  tenderness without rebound and otherwise non-tender, non-distended, with normoactive bowel sounds.  Ext: Warm, no deformities Skin: No rashes, lesions or ulcers on visualized skin. Neuro: Alert and somewhat oriented. Speech, accounting for edentulousness, is abnormal. Right eye ptosis. No other focal neurological deficits. Psych: Calm.  Data Reviewed: CBC normal, covid, flu negative, glucose 105, Cr 2.2, BUN 35, LFTs wnl. K 5. BNP 52. Trop 9. LA 0.9.  ABG 7.27/78/87  Urinalysis with nintrite-negative pyuria and bacteriuria (>50 WBCs and >50 RBCs with hyaline casts and no squam's on micro).   CT head no acute intracranial findings.  Assessment and Plan: * UTI (urinary tract infection) Wt is 118kg, so will give additional gram of CTX, start on 2g q24h pending urine culture results.  Acute on chronic respiratory failure with hypoxia and hypercapnia (HCC) Respiratory acidosis noted, previous pCO2 in 30-40's range. Had similar presentation with UTI leading to encephalopathy with respiratory acidosis previously. Baseline COPD and OHS. - Mentating better, so will recheck ABG. Avoid oversupplementation of oxygen. No need for NIPPV is evident at this time.  Acute metabolic encephalopathy Improving, unclear baseline though some cognitive impairment is noted in palliative notes as  outpatient.  - Delirium precautions - With hx CVA, right eye ptosis, abnormal speech, will check MR brain.   Acute renal failure superimposed on stage 3b chronic kidney disease ([place]) Continue IVF. Despite hx CHF, appears volume depleted on arrival. Will monitor volume status closely.  COPD (chronic obstructive pulmonary disease) (HCC) Known centrilobular and paraseptal emphysema for many years, no active wheezing currently.  - Note abnormal CXR, consider HRCT after discharge to evaluate for ILD.   Advance Care Planning: Has hx of full code and DNR with several changes. Pt current states full code, though may be unreliable. Will keep full code until we can speak with husband.  Consults: None  Family Communication: None at bedside  Severity of Illness: The appropriate patient status for this patient is INPATIENT. Inpatient status is judged to be reasonable and necessary in order to provide the required intensity of service to ensure the patient's safety. The patient's presenting symptoms, physical exam findings, and initial radiographic and laboratory data in the context of their chronic comorbidities is felt to place them at high risk for further clinical deterioration. Furthermore, it is not anticipated that the patient will be medically stable for discharge from the hospital within 2 midnights of admission.   * I certify that at the point of admission it is my clinical judgment that the patient will require inpatient hospital care spanning beyond 2 midnights from the point of admission due to high intensity of service, high risk for further deterioration and high frequency of surveillance required.*  Author: Patrecia Pour, MD 04/19/2021 8:23 AM  For on call review [email or web address].

## 2021-04-19 NOTE — ED Notes (Signed)
Pt resting comfortably at this time. NAD noted. Pt given warm blankets and pt denies any other needs at this time. 2nd rocephin hung. Call bell in reach.  ?

## 2021-04-19 NOTE — Consult Note (Signed)
CRITICAL CARE     Name: Katherine [name] MRN: [id number] DOB: 06-11-1940     LOS: 0   SUBJECTIVE FINDINGS & SIGNIFICANT EVENTS    Patient description:  81 yo F with PMH DM2, CKD, HTN, CVA, HFpEF, COPD/OSA overlap with chronic hypoxemia who came from SNF due to altered mental status with lethargy.  She had ABG done with acidemia and hypercapnia noted.  During interview she made attempt articulations with incomprehensible language on BIPAP. She is treated empirically for CAP and possible UTI due to pyuria. PCCM consult for further evaluation and management of hypercapnic hypoxemic respiratory failure with UTI and pneumonia.   Lines/tubes :   Microbiology/Sepsis markers: Results for orders placed or performed during the hospital encounter of 04/19/21  Culture, blood (routine x 2)     Status: None (Preliminary result)   Collection Time: 04/19/21  5:33 AM   Specimen: BLOOD  Result Value Ref Range Status   Specimen Description BLOOD RIGHT [facility]  Final   Special Requests   Final    BOTTLES DRAWN AEROBIC AND ANAEROBIC Blood Culture adequate volume   Culture   Final    NO GROWTH <12 HOURS Performed at [facility], [address] [place], [place] [address]    Report Status PENDING  Incomplete  Culture, blood (routine x 2)     Status: None (Preliminary result)   Collection Time: 04/19/21  5:33 AM   Specimen: BLOOD  Result Value Ref Range Status   Specimen Description BLOOD RIGHT FA  Final   Special Requests   Final    BOTTLES DRAWN AEROBIC AND ANAEROBIC Blood Culture adequate volume   Culture   Final    NO GROWTH <12 HOURS Performed at [facility], [address], [place], [place] [address]    Report Status PENDING  Incomplete  Resp Panel by RT-PCR (Flu A&B, Covid) Nasopharyngeal Swab      Status: None   Collection Time: 04/19/21  5:33 AM   Specimen: Nasopharyngeal Swab; Nasopharyngeal(NP) swabs in vial transport medium  Result Value Ref Range Status   SARS Coronavirus 2 by RT PCR NEGATIVE NEGATIVE Final    Comment: (NOTE) SARS-CoV-2 target nucleic acids are NOT DETECTED.  The SARS-CoV-2 RNA is generally detectable in upper respiratory specimens during the acute phase of infection. The lowest concentration of SARS-CoV-2 viral copies this assay can detect is 138 copies/mL. A negative result does not preclude SARS-Cov-2 infection and should not be used as the sole basis for treatment or other patient management decisions. A negative result may occur with  improper specimen collection/handling, submission of specimen other than nasopharyngeal swab, presence of viral mutation(s) within the areas targeted by this assay, and inadequate number of viral copies(<138 copies/mL). A negative result must be combined with clinical observations, patient history, and epidemiological information. The expected result is Negative.  Fact Sheet for Patients:  EntrepreneurPulse.com.au  Fact Sheet for Healthcare Providers:  IncredibleEmployment.be  This test is no t yet approved or cleared by the [place] FDA and  has been authorized for detection and/or diagnosis of SARS-CoV-2 by FDA under an Emergency Use Authorization (EUA). This EUA will remain  in effect (meaning this test can be used) for the duration of the COVID-19 declaration under Section 564(b)(1) of the Act, 21 U.S.C.section 360bbb-3(b)(1), unless the authorization is terminated  or revoked sooner.       Influenza A by PCR NEGATIVE NEGATIVE Final   Influenza B by PCR NEGATIVE NEGATIVE Final  Comment: (NOTE) The Xpert Xpress SARS-CoV-2/FLU/RSV plus assay is intended as an aid in the diagnosis of influenza from Nasopharyngeal swab specimens and should not be used as a sole basis  for treatment. Nasal washings and aspirates are unacceptable for Xpert Xpress SARS-CoV-2/FLU/RSV testing.  Fact Sheet for Patients: EntrepreneurPulse.com.au  Fact Sheet for Healthcare Providers: IncredibleEmployment.be  This test is not yet approved or cleared by the [place] FDA and has been authorized for detection and/or diagnosis of SARS-CoV-2 by FDA under an Emergency Use Authorization (EUA). This EUA will remain in effect (meaning this test can be used) for the duration of the COVID-19 declaration under Section 564(b)(1) of the Act, 21 U.S.C. section 360bbb-3(b)(1), unless the authorization is terminated or revoked.  Performed at [facility], [address] [place], [place] [address]     Anti-infectives:  Anti-infectives (From admission, onward)    Start     Dose/Rate Route Frequency Ordered Stop   04/20/21 0600  cefTRIAXone (ROCEPHIN) 2 g in sodium chloride 0.9 % 100 mL IVPB       Note to Pharmacy: to make 2g total dose   2 g 200 mL/hr over 30 Minutes Intravenous Every 24 hours 04/19/21 0905     04/19/21 0715  cefTRIAXone (ROCEPHIN) 1 g in sodium chloride 0.9 % 100 mL IVPB       Note to Pharmacy: to make 2g total dose   1 g 200 mL/hr over 30 Minutes Intravenous  Once 04/19/21 0714 04/19/21 0810   04/19/21 0630  cefTRIAXone (ROCEPHIN) 1 g in sodium chloride 0.9 % 100 mL IVPB        1 g 200 mL/hr over 30 Minutes Intravenous  Once 04/19/21 2774 04/19/21 0654        Consults: Treatment Team:  Pccm, Ander Gaster, MD Ottie Glazier, MD      PAST MEDICAL HISTORY   Past Medical History:  Diagnosis Date   Angioedema    Anxiety and depression    CHF (congestive heart failure) (HCC)    Chronic constipation    Chronic kidney disease    stage 3   COPD (chronic obstructive pulmonary disease) ([place])    Diabetes mellitus without complication ([place])    Gallstones    GERD (gastroesophageal reflux disease)     Hypertension    Left ventricular hypertrophy    Osteoarthritis    Prurigo nodularis    Seizures ([place])    Stroke ([place])    Tobacco abuse    Vitamin D deficiency disease      SURGICAL HISTORY   Past Surgical History:  Procedure Laterality Date   CHOLECYSTECTOMY     POLYPECTOMY  11/2011   vocal cord   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   Procedure: TOTAL KNEE ARTHROPLASTY;  Surgeon: Hessie Knows, MD;  Location: [facility] ORS;  Service: Orthopedics;  Laterality: Right;     FAMILY HISTORY   Family History  Problem Relation Age of Onset   Alcohol abuse Mother    Sickle cell anemia Daughter    Hypertension Son    Cancer Neg Hx    COPD Neg Hx    Diabetes Neg Hx    Heart disease Neg Hx    Stroke Neg Hx      SOCIAL HISTORY   Social History   Tobacco Use   Smoking status: Former    Packs/day: 0.50    Years: 60.00    Pack years: 30.00    Types: Cigarettes   Smokeless tobacco: Never  Tobacco comments:    quite 58moago-03/26/18  Vaping Use   Vaping Use: Never used  Substance Use Topics   Alcohol use: No    Alcohol/week: 0.0 standard drinks    Comment: rare   Drug use: No     MEDICATIONS   Current Medication:  Current Facility-Administered Medications:    acetaminophen (TYLENOL) tablet 650 mg, 650 mg, Oral, Q6H PRN **OR** acetaminophen (TYLENOL) suppository 650 mg, 650 mg, Rectal, Q6H PRN, GPatrecia Pour MD   [START ON 04/20/2021] cefTRIAXone (ROCEPHIN) 2 g in sodium chloride 0.9 % 100 mL IVPB, 2 g, Intravenous, Q24H, GPatrecia Pour MD   [START ON 04/20/2021] fluticasone furoate-vilanterol (BREO ELLIPTA) 100-25 MCG/ACT 1 puff, 1 puff, Inhalation, Daily **AND** [START ON 04/20/2021] umeclidinium bromide (INCRUSE ELLIPTA) 62.5 MCG/ACT 1 puff, 1 puff, Inhalation, Daily, GVance GatherB, MD   heparin injection 5,000 Units, 5,000 Units, Subcutaneous, Q8H, GPatrecia Pour MD, 5,000 Units at 04/19/21 1022   hydrALAZINE (APRESOLINE) injection 5 mg, 5 mg, Intravenous, Q4H PRN,  GPatrecia Pour MD   insulin aspart (novoLOG) injection 0-5 Units, 0-5 Units, Subcutaneous, QHS, GBonner Puna Ryan B, MD   insulin aspart (novoLOG) injection 0-9 Units, 0-9 Units, Subcutaneous, TID WC, GBonner Puna Ryan B, MD   ipratropium-albuterol (DUONEB) 0.5-2.5 (3) MG/3ML nebulizer solution 3 mL, 3 mL, Nebulization, Q6H PRN, GPatrecia Pour MD   lacosamide (VIMPAT) tablet 100 mg, 100 mg, Oral, BID, GVance GatherB, MD, 100 mg at 04/19/21 1022   lactated ringers infusion, , Intravenous, Continuous, GPatrecia Pour MD, Last Rate: 100 mL/hr at 04/19/21 1613, Infusion Verify at 04/19/21 1613   levETIRAcetam (KEPPRA) tablet 750 mg, 750 mg, Oral, BID, GVance GatherB, MD, 750 mg at 04/19/21 1022   ondansetron (ZOFRAN) tablet 4 mg, 4 mg, Oral, Q6H PRN **OR** ondansetron (ZOFRAN) injection 4 mg, 4 mg, Intravenous, Q6H PRN, GVance GatherB, MD   sodium chloride flush (NS) 0.9 % injection 3 mL, 3 mL, Intravenous, Q12H, GVance GatherB, MD  Current Outpatient Medications:    ammonium lactate (AMLACTIN) 12 % cream, Apply 1 application. topically at bedtime., Disp: , Rfl:    cetirizine (ZYRTEC) 10 MG tablet, Take 10 mg by mouth daily. , Disp: , Rfl:    Fluticasone-Umeclidin-Vilant 100-62.5-25 MCG/INH AEPB, Inhale 1 puff into the lungs daily., Disp: , Rfl:    ipratropium-albuterol (DUONEB) 0.5-2.5 (3) MG/3ML SOLN, Take 3 mLs by nebulization 3 (three) times daily., Disp: 250 mL, Rfl: 0   lacosamide 100 MG TABS, Take 1 tablet (100 mg total) by mouth 2 (two) times daily., Disp: 60 tablet, Rfl:    levETIRAcetam (KEPPRA) 750 MG tablet, Take 1 tablet (750 mg total) by mouth 2 (two) times daily., Disp: , Rfl:    lidocaine (LIDODERM) 5 %, Place 1 patch onto the skin daily., Disp: , Rfl:    pantoprazole (PROTONIX) 20 MG tablet, Take 20 mg by mouth daily., Disp: , Rfl:    propranolol (INDERAL) 10 MG tablet, Take 10 mg by mouth 2 (two) times daily., Disp: , Rfl:    Saline GEL, Place 3 sprays into both nostrils 3 (three) times daily. ,  Disp: , Rfl:    sertraline (ZOLOFT) 25 MG tablet, Take 25 mg by mouth daily., Disp: , Rfl:    Skin Protectants, Misc. (EUCERIN) cream, Apply 1 application. topically at bedtime., Disp: , Rfl:    acetaminophen (TYLENOL) 650 MG CR tablet, Take 650 mg by mouth 3 (three) times daily., Disp: , Rfl:  albuterol (ACCUNEB) 0.63 MG/3ML nebulizer solution, Take 3 mLs by nebulization every 8 (eight) hours as needed for wheezing. , Disp: , Rfl:    atorvastatin (LIPITOR) 80 MG tablet, Take 80 mg by mouth every evening.  (Patient not taking: Reported on 04/19/2021), Disp: , Rfl:    docusate sodium (COLACE) 100 MG capsule, Take 100 mg by mouth 2 (two) times daily. (Patient not taking: Reported on 04/19/2021), Disp: , Rfl:    ferrous sulfate 325 (65 FE) MG tablet, Take 325 mg by mouth 2 (two) times daily. (Patient not taking: Reported on 04/19/2021), Disp: , Rfl:    gabapentin (NEURONTIN) 300 MG capsule, Take 300 mg by mouth at bedtime., Disp: , Rfl:    linagliptin (TRADJENTA) 5 MG TABS tablet, Take 5 mg by mouth daily. (Patient not taking: Reported on 04/19/2021), Disp: , Rfl:    LORazepam (ATIVAN) 2 MG/ML injection, Inject 0.5 mg into the vein daily as needed for seizure., Disp: , Rfl:    Menthol, Topical Analgesic, (BIOFREEZE) 4 % GEL, Apply 1 application topically in the morning and at bedtime. To both knees , Disp: , Rfl:    mupirocin ointment (BACTROBAN) 2 %, Place 1 application into the nose 2 (two) times daily. (Patient not taking: Reported on 04/19/2021), Disp: 22 g, Rfl: 0   nitroGLYCERIN (NITROSTAT) 0.4 MG SL tablet, Place 0.4 mg under the tongue every 5 (five) minutes as needed for chest pain. , Disp: , Rfl:    pantoprazole (PROTONIX) 40 MG tablet, Take 1 tablet (40 mg total) by mouth daily. (Patient not taking: Reported on 04/19/2021), Disp: 60 tablet, Rfl: 0   Saccharomyces boulardii (PROBIOTIC) 250 MG CAPS, Take 1 capsule by mouth 2 times daily at 12 noon and 4 pm. (Patient not taking: Reported on 04/19/2021),  Disp: 60 capsule, Rfl: 0   vitamin C (VITAMIN C) 500 MG tablet, Take 1 tablet (500 mg total) by mouth daily. (Patient not taking: Reported on 04/19/2021), Disp: 30 tablet, Rfl: 0   zinc sulfate 220 (50 Zn) MG capsule, Take 1 capsule (220 mg total) by mouth daily. (Patient not taking: Reported on 04/19/2021), Disp: 30 capsule, Rfl: 0    ALLERGIES   Bee venom and Enalapril maleate    REVIEW OF SYSTEMS     ROS unable to obtain due to severe lethargy GCS9   PHYSICAL EXAMINATION   Vital Signs: Temp:  [98.1 F (36.7 C)] 98.1 F (36.7 C) (03/07 0534) Pulse Rate:  [67-77] 73 (03/07 1500) Resp:  [20-22] 20 (03/07 1400) BP: (105-153)/(54-98) 126/98 (03/07 1500) SpO2:  [94 %-100 %] 95 % (03/07 1600) Weight:  [118.1 kg] 118.1 kg (03/07 0538)  GENERAL:Older then stated age, encephalopathic no apparent distress HEAD: Normocephalic, atraumatic.  EYES: Pupils equal, round, reactive to light.  No scleral icterus.  MOUTH: Moist mucosal membrane. NECK: Supple. No thyromegaly. No nodules. No JVD.  PULMONARY: decreased breath sounds bilaterally  CARDIOVASCULAR: S1 and S2. Regular rate and rhythm. No murmurs, rubs, or gallops.  GASTROINTESTINAL: Soft, nontender, non-distended. No masses. Positive bowel sounds. No hepatosplenomegaly.  MUSCULOSKELETAL: No swelling, clubbing, or edema.  NEUROLOGIC: Mild distress due to acute illness SKIN:intact,warm,dry   PERTINENT DATA     Infusions:  [START ON 04/20/2021] cefTRIAXone (ROCEPHIN)  IV     lactated ringers 100 mL/hr at 04/19/21 1613   Scheduled Medications:  [START ON 04/20/2021] fluticasone furoate-vilanterol  1 puff Inhalation Daily   And   [START ON 04/20/2021] umeclidinium bromide  1 puff Inhalation Daily  heparin  5,000 Units Subcutaneous Q8H   insulin aspart  0-5 Units Subcutaneous QHS   insulin aspart  0-9 Units Subcutaneous TID WC   lacosamide  100 mg Oral BID   levETIRAcetam  750 mg Oral BID   sodium chloride flush  3 mL Intravenous  Q12H   PRN Medications: acetaminophen **OR** acetaminophen, hydrALAZINE, ipratropium-albuterol, ondansetron **OR** ondansetron (ZOFRAN) IV Hemodynamic parameters:   Intake/Output: 03/06 0701 - 03/07 0700 In: 99.9 [IV Piggyback:99.9] Out: -   Ventilator  Settings:    LAB RESULTS:  Basic Metabolic Panel: Recent Labs  Lab 04/19/21 0533  NA 144  K 5.0  CL 106  CO2 32  GLUCOSE 105*  BUN 35*  CREATININE 2.20*  CALCIUM 9.4   Liver Function Tests: Recent Labs  Lab 04/19/21 0533  AST 15  ALT 11  ALKPHOS 96  BILITOT 0.3  PROT 7.2  ALBUMIN 3.2*   No results for input(s): LIPASE, AMYLASE in the last 168 hours. No results for input(s): AMMONIA in the last 168 hours. CBC: Recent Labs  Lab 04/19/21 0533  WBC 5.2  NEUTROABS 2.4  HGB 12.3  HCT 40.7  MCV 99.3  PLT 165   Cardiac Enzymes: No results for input(s): CKTOTAL, CKMB, CKMBINDEX, TROPONINI in the last 168 hours. BNP: Invalid input(s): POCBNP CBG: Recent Labs  Lab 04/19/21 1138  GLUCAP 99       IMAGING RESULTS:  Imaging: CT Head Wo Contrast  Result Date: 04/19/2021 CLINICAL DATA:  81 year old female with history of altered mental status. EXAM: CT HEAD WITHOUT CONTRAST TECHNIQUE: Contiguous axial images were obtained from the base of the skull through the vertex without intravenous contrast. RADIATION DOSE REDUCTION: This exam was performed according to the departmental dose-optimization program which includes automated exposure control, adjustment of the mA and/or kV according to patient size and/or use of iterative reconstruction technique. COMPARISON:  Head CT 12/28/2019. FINDINGS: Brain: Mild cerebral atrophy. Patchy areas of mild decreased attenuation are noted throughout the deep and periventricular white matter of the cerebral hemispheres bilaterally, compatible with mild chronic microvascular ischemic disease. No evidence of acute infarction, hemorrhage, hydrocephalus, extra-axial collection or mass  lesion/mass effect. Vascular: No hyperdense vessel or unexpected calcification. Skull: Normal. Negative for fracture or focal lesion. Sinuses/Orbits: No acute finding. Chronic right mastoid effusion, unchanged. Other: None. IMPRESSION: 1. No acute intracranial abnormalities. 2. Mild cerebral atrophy with mild chronic microvascular ischemic changes in the cerebral white matter, as above. Electronically Signed   By: Vinnie Langton M.D.   On: 04/19/2021 06:12   MR BRAIN WO CONTRAST  Result Date: 04/19/2021 CLINICAL DATA:  Mental status change, unknown cause. EXAM: MRI HEAD WITHOUT CONTRAST TECHNIQUE: Multiplanar, multiecho pulse sequences of the brain and surrounding structures were obtained without intravenous contrast. COMPARISON:  Prior head CT examinations 04/19/2021 and earlier. Brain MRI 02/11/2019. FINDINGS: Intermittently motion degraded examination. Most notably, there is moderate motion degradation of the sagittal T1 weighted sequence and mild motion degradation of the axial T1 weighted sequence. Brain: No age advanced or lobar predominant cerebral atrophy. Moderate multifocal T2 FLAIR hyperintense signal abnormality within the cerebral white matter and pons, nonspecific but compatible chronic small vessel disease. As before, chronic small vessel ischemic changes are also present within the right basal ganglia. Partially empty sella turcica. There is no acute infarct. No evidence of an intracranial mass. No chronic intracranial blood products. No extra-axial fluid collection. No midline shift. Vascular: Maintained flow voids within the proximal large arterial vessels. Skull and upper cervical spine:  No focal suspicious marrow lesion. Incompletely assessed cervical spondylosis. Sinuses/Orbits: Bilateral proptosis. Small mucous retention cyst within the left sphenoid sinus. Mild mucosal thickening within the bilateral ethmoid sinuses. Other: Right mastoid effusion. Small-volume fluid also present within  the left mastoid air cells. IMPRESSION: No evidence of acute intracranial abnormality. Moderate chronic small vessel ischemic changes within the cerebral white matter and pons. Chronic small vessel ischemic changes are also present within the right basal ganglia. These findings have not significantly changed from the prior brain MRI of 02/11/2019. Bilateral proptosis. Mild paranasal sinus disease, as described. Right mastoid effusion. Small-volume fluid also present within the left mastoid air cells. Electronically Signed   By: Kellie Simmering D.O.   On: 04/19/2021 10:23   DG Chest Port 1 View  Result Date: 04/19/2021 CLINICAL DATA:  81 year old female with history of altered mental status. EXAM: PORTABLE CHEST 1 VIEW COMPARISON:  Chest x-ray 01/12/2021. FINDINGS: Lung volumes are low. Diffuse areas of interstitial prominence and widespread peribronchial cuffing, slightly increased compared to the prior study, particularly in the right mid to lower lung. No confluent consolidative airspace disease. No pleural effusions. No pneumothorax. No evidence of pulmonary edema. Heart size is mildly enlarged. The patient is rotated to the right on today's exam, resulting in distortion of the mediastinal contours and reduced diagnostic sensitivity and specificity for mediastinal pathology. Atherosclerotic calcifications are noted in the thoracic aorta. IMPRESSION: 1. The appearance of the lungs could suggests potential interstitial lung disease. Further evaluation with high-resolution chest CT is recommended in the near future to better evaluate these findings. 2. Aortic atherosclerosis. 3. Mild cardiomegaly. Electronically Signed   By: Vinnie Langton M.D.   On: 04/19/2021 06:15   _0 @ CT Head Wo Contrast  Result Date: 04/19/2021 CLINICAL DATA:  81 year old female with history of altered mental status. EXAM: CT HEAD WITHOUT CONTRAST TECHNIQUE: Contiguous axial images were obtained from the base of the skull through  the vertex without intravenous contrast. RADIATION DOSE REDUCTION: This exam was performed according to the departmental dose-optimization program which includes automated exposure control, adjustment of the mA and/or kV according to patient size and/or use of iterative reconstruction technique. COMPARISON:  Head CT 12/28/2019. FINDINGS: Brain: Mild cerebral atrophy. Patchy areas of mild decreased attenuation are noted throughout the deep and periventricular white matter of the cerebral hemispheres bilaterally, compatible with mild chronic microvascular ischemic disease. No evidence of acute infarction, hemorrhage, hydrocephalus, extra-axial collection or mass lesion/mass effect. Vascular: No hyperdense vessel or unexpected calcification. Skull: Normal. Negative for fracture or focal lesion. Sinuses/Orbits: No acute finding. Chronic right mastoid effusion, unchanged. Other: None. IMPRESSION: 1. No acute intracranial abnormalities. 2. Mild cerebral atrophy with mild chronic microvascular ischemic changes in the cerebral white matter, as above. Electronically Signed   By: Vinnie Langton M.D.   On: 04/19/2021 06:12   MR BRAIN WO CONTRAST  Result Date: 04/19/2021 CLINICAL DATA:  Mental status change, unknown cause. EXAM: MRI HEAD WITHOUT CONTRAST TECHNIQUE: Multiplanar, multiecho pulse sequences of the brain and surrounding structures were obtained without intravenous contrast. COMPARISON:  Prior head CT examinations 04/19/2021 and earlier. Brain MRI 02/11/2019. FINDINGS: Intermittently motion degraded examination. Most notably, there is moderate motion degradation of the sagittal T1 weighted sequence and mild motion degradation of the axial T1 weighted sequence. Brain: No age advanced or lobar predominant cerebral atrophy. Moderate multifocal T2 FLAIR hyperintense signal abnormality within the cerebral white matter and pons, nonspecific but compatible chronic small vessel disease. As before, chronic small vessel  ischemic changes  are also present within the right basal ganglia. Partially empty sella turcica. There is no acute infarct. No evidence of an intracranial mass. No chronic intracranial blood products. No extra-axial fluid collection. No midline shift. Vascular: Maintained flow voids within the proximal large arterial vessels. Skull and upper cervical spine: No focal suspicious marrow lesion. Incompletely assessed cervical spondylosis. Sinuses/Orbits: Bilateral proptosis. Small mucous retention cyst within the left sphenoid sinus. Mild mucosal thickening within the bilateral ethmoid sinuses. Other: Right mastoid effusion. Small-volume fluid also present within the left mastoid air cells. IMPRESSION: No evidence of acute intracranial abnormality. Moderate chronic small vessel ischemic changes within the cerebral white matter and pons. Chronic small vessel ischemic changes are also present within the right basal ganglia. These findings have not significantly changed from the prior brain MRI of 02/11/2019. Bilateral proptosis. Mild paranasal sinus disease, as described. Right mastoid effusion. Small-volume fluid also present within the left mastoid air cells. Electronically Signed   By: Kellie Simmering D.O.   On: 04/19/2021 10:23   DG Chest Port 1 View  Result Date: 04/19/2021 CLINICAL DATA:  81 year old female with history of altered mental status. EXAM: PORTABLE CHEST 1 VIEW COMPARISON:  Chest x-ray 01/12/2021. FINDINGS: Lung volumes are low. Diffuse areas of interstitial prominence and widespread peribronchial cuffing, slightly increased compared to the prior study, particularly in the right mid to lower lung. No confluent consolidative airspace disease. No pleural effusions. No pneumothorax. No evidence of pulmonary edema. Heart size is mildly enlarged. The patient is rotated to the right on today's exam, resulting in distortion of the mediastinal contours and reduced diagnostic sensitivity and specificity for  mediastinal pathology. Atherosclerotic calcifications are noted in the thoracic aorta. IMPRESSION: 1. The appearance of the lungs could suggests potential interstitial lung disease. Further evaluation with high-resolution chest CT is recommended in the near future to better evaluate these findings. 2. Aortic atherosclerosis. 3. Mild cardiomegaly. Electronically Signed   By: Vinnie Langton M.D.   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 -continue Full MV support -continue Bronchodilator Therapy -Wean Fio2 and PEEP as tolerated -will perform SAT/SBT when respiratory parameters are met -empiric rocephin  -MRSA pcr -procalcitonin is of limited utility in context of CKD -BNP and TTE to evaluate cardiac dysfunction interval changes from prior study with poor diastology   Acute on chronic COPD  -Duoeneb  -ICS/LABA via [place]   - IS /flutter with Metaneb   Urinary tract infection     - symptomatic pyuria    - Roceophin IV  Deforming arthropathy    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 - no hx of AI disease    - will order inflammatory biomarkers as well as Rheumatoid factor and Anti CCP for RA workup   - empirically start solumedrol due to hx of Advanced COPD    Renal Failure- CKD 3b -follow chem 7 -follow UO -continue Foley Catheter-assess need daily -[place] non essential nephrotoxin  NEUROLOGY - history of CVA with aphasia  - PT/OT Wake up assessment pending ID -continue IV abx as prescibed -follow up cultures  GI/Nutrition GI PROPHYLAXIS as indicated DIET-->TF's as tolerated Constipation protocol as indicated  ENDO - ICU hypoglycemic\Hyperglycemia protocol -check FSBS per protocol   ELECTROLYTES -follow labs as needed -replace as needed -pharmacy consultation   DVT/GI PRX ordered -SCDs  TRANSFUSIONS AS NEEDED MONITOR FSBS ASSESS the need for  LABS  as needed   Critical care provider statement:   Total critical care time: 33 minutes   Performed by: Lanney Gins MD   Critical care time was exclusive of separately billable procedures and treating other patients.   Critical care was necessary to treat or prevent imminent or life-threatening deterioration.   Critical care was time spent personally by me on the following activities: development of treatment plan with patient and/or surrogate as well as nursing, discussions with consultants, evaluation of patient's response to treatment, examination of patient, obtaining history from patient or surrogate, ordering and performing treatments and interventions, ordering and review of laboratory studies, ordering and review of radiographic studies, pulse oximetry and re-evaluation of patient's condition.      Ottie Glazier, M.D.  Pulmonary & Critical Care Medicine      This document was prepared using Dragon voice recognition software and may include unintentional dictation errors.

## 2021-04-19 NOTE — Assessment & Plan Note (Signed)
Known centrilobular and paraseptal emphysema for many years, no active wheezing currently.  ?- Note abnormal CXR, consider HRCT after discharge to evaluate for ILD. ?

## 2021-04-19 NOTE — ED Notes (Signed)
Pt placed on bedpan and had a medium BM that was half formed half diarrhea.  ? ?Pt's husband speaking to MRI at this time.  ?

## 2021-04-19 NOTE — ED Notes (Signed)
This RN and paramedic at bedside to change pt's sheet, place new linen, new purewick, and new chux.  ?

## 2021-04-19 NOTE — ED Notes (Signed)
Pt refusing ABG at this time.  ? ?Pt states rash to where BP cuff was but is red with no rash noted, this RN will move BP cuff to another area for relief.  ?

## 2021-04-19 NOTE — ED Provider Notes (Addendum)
[facility] Provider Note    Event Date/Time   First MD Initiated Contact with Patient 04/19/21 [phone]     (approximate)   History   Altered Mental Status   HPI  Level of V caveat: Limited by altered mentation  Katherine [name] is a 81 y.o. female brought to the ED via EMS from SNF with a chief complaint of altered mental status.  Patient with a history of diabetes, respiratory failure on continuous oxygen, hypertension, seizures, stroke who is usually nonambulatory but GCS 15 at baseline.  During nursing rounds approximately 4:42 AM, patient was noted to be unresponsive with gurgling.  She was last seen in her baseline state of health approximately 6:30 PM and shortly afterwards went to sleep.  Patient arrives to the ED able to open her eyes, moans to painful stimuli, attempts to squeeze fingers on command.  Rest of history is unobtainable secondary to patient's altered mentation.     Past Medical History   Past Medical History:  Diagnosis Date   Angioedema    Anxiety and depression    CHF (congestive heart failure) (HCC)    Chronic constipation    Chronic kidney disease    stage 3   COPD (chronic obstructive pulmonary disease) (HCC)    Diabetes mellitus without complication (HCC)    Gallstones    GERD (gastroesophageal reflux disease)    Hypertension    Left ventricular hypertrophy    Osteoarthritis    Prurigo nodularis    Seizures (HCC)    Stroke ([place])    Tobacco abuse    Vitamin D deficiency disease      Active Problem List   Patient Active Problem List   Diagnosis Date Noted   Respiratory failure, acute-on-chronic ([place]) 12/28/2019   Type 2 diabetes mellitus with stage 4 chronic kidney disease ([place]) 12/28/2019   Sepsis due to gram-negative UTI ([place]) 06/19/2019   Type II diabetes mellitus with renal manifestations ([place]) 06/19/2019   Stroke ([place]) 06/19/2019   AAA (abdominal aortic aneurysm) 96/05/5407   Acute metabolic encephalopathy  81/19/1478   Iron deficiency anemia    Altered mental status    Acute encephalopathy 02/11/2019   Sepsis secondary to UTI ([place]) 01/08/2019   Acute renal failure superimposed on stage 3b chronic kidney disease ([place]) 01/08/2019   Seizure disorder ([place]) 01/08/2019   Diabetes mellitus type 2, controlled, with complications ([place]) 29/56/2130   Acute on chronic anemia 01/03/2019   COVID-19 12/21/2018   COVID-19 virus infection 12/21/2018   Hemoptysis 12/20/2018   Pulmonary embolism ([place]) 11/21/2018   Delirium    UTI (urinary tract infection) 08/21/2018   Chest pain 07/15/2018   Palliative care encounter 05/10/2018   Localized edema 05/10/2018   Shortness of breath 05/10/2018   Hematemesis 04/28/2018   Influenza A 04/13/2018   Acute on chronic congestive heart failure ([place])    COPD with acute exacerbation ([place]) 02/24/2018   Chronic diastolic heart failure ([place]) 12/31/2017   Lymphedema 12/31/2017   COPD exacerbation ([place]) 11/30/2017   Urinary tract infection 11/22/2017   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[place]) 11/16/2017   Diabetes mellitus type 2, uncomplicated ([place]) 86/57/8469   Pressure injury of skin 10/29/2017   Acute kidney injury superimposed on CKD ([place]) 10/24/2017   Sepsis ([place]) 10/24/2017   Possible Seizures ([place]) 10/08/2017   Hyperlipemia 10/08/2017   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   Stroke-like episode ([place]) s/p IV tpa 10/04/2017   Palliative care by specialist  Elevated rheumatoid factor 09/05/2017   Frequent hospital admissions 08/22/2017   Aphasia 06/28/2017   Right hand pain 03/21/2017   Dry skin 03/21/2017   Pain in finger of left hand 09/12/2016   Chronic fatigue 06/12/2016   Left knee pain 06/12/2016   Goals of care, counseling/discussion 03/13/2016   Primary localized osteoarthritis of right knee 02/08/2016   OSA (obstructive sleep apnea) 09/16/2015   Rotator cuff syndrome 09/07/2015   Pulmonary scarring 07/27/2015   Sleep  disturbance 04/14/2015   Coronary artery disease 03/14/2015   Polyp of vocal cord 03/14/2015   Hypertensive heart disease without congestive heart failure 09/29/2014   Acute respiratory failure with hypoxia ([place]) 17/49/4496   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   Anxiety    Tobacco abuse    Prurigo nodularis    GERD (gastroesophageal reflux disease)    COPD (chronic obstructive pulmonary disease) ([place])    Osteoarthritis    Vitamin D deficiency disease    Chronic constipation    CKD (chronic kidney disease), stage III ([place])    Essential hypertension 05/20/2013   Morbid obesity ([place]) 05/20/2013   Pain in soft tissues of limb 05/20/2013     Past Surgical History   Past Surgical History:  Procedure Laterality Date   CHOLECYSTECTOMY     POLYPECTOMY  11/2011   vocal cord   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   Procedure: TOTAL KNEE ARTHROPLASTY;  Surgeon: Hessie Knows, MD;  Location: [facility] ORS;  Service: Orthopedics;  Laterality: Right;     Home Medications   Prior to Admission medications   Medication Sig Start Date End Date Taking? Authorizing Provider  acetaminophen (TYLENOL) 650 MG CR tablet Take 650 mg by mouth 3 (three) times daily.    [provider]  albuterol (ACCUNEB) 0.63 MG/3ML nebulizer solution Take 3 mLs by nebulization every 8 (eight) hours as needed for wheezing.     [provider]  atorvastatin (LIPITOR) 80 MG tablet Take 80 mg by mouth every evening.     [provider]  cetirizine (ZYRTEC) 10 MG tablet Take 10 mg by mouth daily.     [provider]  docusate sodium (COLACE) 100 MG capsule Take 100 mg by mouth 2 (two) times daily.    [provider]  ferrous sulfate 325 (65 FE) MG tablet Take 325 mg by mouth 2 (two) times daily.    [provider]  Fluticasone-Umeclidin-Vilant 100-62.5-25 MCG/INH AEPB Inhale 1 puff into the lungs daily.    [provider]  gabapentin (NEURONTIN) 300 MG capsule  Take 300 mg by mouth at bedtime.    [provider]  ipratropium-albuterol (DUONEB) 0.5-2.5 (3) MG/3ML SOLN Take 3 mLs by nebulization 3 (three) times daily. 07/17/18   Gladstone Lighter, MD  lacosamide 100 MG TABS Take 1 tablet (100 mg total) by mouth 2 (two) times daily. 12/31/18   Thurnell Lose, MD  levETIRAcetam (KEPPRA) 750 MG tablet Take 1 tablet (750 mg total) by mouth 2 (two) times daily. 12/30/19   Wouk, Ailene Rud, MD  lidocaine (LIDODERM) 5 % 1 patch daily. 01/06/21   [provider]  linagliptin (TRADJENTA) 5 MG TABS tablet Take 5 mg by mouth daily.    [provider]  LORazepam (ATIVAN) 2 MG/ML injection Inject 0.5 mg into the vein daily as needed for seizure.    [provider]  Menthol, Topical Analgesic, (BIOFREEZE) 4 % GEL Apply 1 application topically in the morning and at bedtime. To both knees  [provider]  mupirocin ointment (BACTROBAN) 2 % Place 1 application into the nose 2 (two) times daily. 06/13/18   Loletha Grayer, MD  nitroGLYCERIN (NITROSTAT) 0.4 MG SL tablet Place 0.4 mg under the tongue every 5 (five) minutes as needed for chest pain.  08/07/18   [provider]  pantoprazole (PROTONIX) 40 MG tablet Take 1 tablet (40 mg total) by mouth daily. 04/29/18   Bettey Costa, MD  Saccharomyces boulardii (PROBIOTIC) 250 MG CAPS Take 1 capsule by mouth 2 times daily at 12 noon and 4 pm. Patient taking differently: Take 1 capsule by mouth 2 (two) times daily. 04/05/19   Merlyn Lot, MD  Saline GEL Place 3 sprays into both nostrils 3 (three) times daily.     [provider]  sertraline (ZOLOFT) 25 MG tablet Take 25 mg by mouth daily. 12/18/20   [provider]  vitamin C (VITAMIN C) 500 MG tablet Take 1 tablet (500 mg total) by mouth daily. 01/12/19   Allie Bossier, MD  zinc sulfate 220 (50 Zn) MG capsule Take 1 capsule (220 mg total) by mouth daily. 01/12/19   Allie Bossier, MD      Allergies  Bee venom and Enalapril maleate   Family History   Family History  Problem Relation Age of Onset   Alcohol abuse Mother    Sickle cell anemia Daughter    Hypertension Son    Cancer Neg Hx    COPD Neg Hx    Diabetes Neg Hx    Heart disease Neg Hx    Stroke Neg Hx      Physical Exam  Triage Vital Signs: ED Triage Vitals  Enc Vitals Group     BP      Pulse      Resp      Temp      Temp src      SpO2      Weight      Height      Head Circumference      Peak Flow      Pain Score      Pain Loc      Pain Edu?      Excl. in [place]?     Updated Vital Signs: BP (!) 124/54    Pulse 67    Temp 98.1 F (36.7 C) (Oral)    Resp 20    Ht 5\' 4"  (1.626 m)    Wt 118.1 kg    SpO2 100%    BMI 44.69 kg/m    General: Obtunded, moderate distress.  CV:  RRR.  Good peripheral perfusion.  Resp:  Normal effort.  Gurgling respirations. Abd:  Obese, nontender.  No distention.  Other:  Eyes open to voice.  Moaning.  Attempts to squeeze fingers on command.  Flaccid BLE which is baseline per nursing facility.   ED Results / Procedures / Treatments  Labs (all labs ordered are listed, but only abnormal results are displayed) Labs Reviewed  COMPREHENSIVE METABOLIC PANEL - Abnormal; Notable for the following components:      Result Value   Glucose, Bld 105 (*)    BUN 35 (*)    Creatinine, Ser 2.20 (*)    Albumin 3.2 (*)    GFR, Estimated 22 (*)    All other components within normal limits  URINALYSIS, ROUTINE W REFLEX MICROSCOPIC - Abnormal; Notable for the following components:   Color, Urine AMBER (*)    APPearance TURBID (*)  Hgb urine dipstick MODERATE (*)    Protein, ur 30 (*)    Leukocytes,Ua LARGE (*)    RBC / HPF >50 (*)    WBC, UA >50 (*)    Bacteria, UA MANY (*)    All other components within normal limits  BLOOD GAS, ARTERIAL - Abnormal; Notable for the following components:   pH, Arterial 7.27 (*)    pCO2 arterial 78 (*)    Bicarbonate 35.8 (*)     Acid-Base Excess 6.0 (*)    All other components within normal limits  RESP PANEL BY RT-PCR (FLU A&B, COVID) ARPGX2  CULTURE, BLOOD (ROUTINE X 2)  CULTURE, BLOOD (ROUTINE X 2)  URINE CULTURE  LACTIC ACID, PLASMA  CBC WITH DIFFERENTIAL/PLATELET  BRAIN NATRIURETIC PEPTIDE  LACTIC ACID, PLASMA  TROPONIN I (HIGH SENSITIVITY)     EKG  ED ECG REPORT I, Daequan Kozma J, the attending physician, personally viewed and interpreted this ECG.   Date: 04/19/2021  EKG Time: 0532  Rate: 70  Rhythm: normal sinus rhythm  Axis: LAD  Intervals:none  ST&T Change: Nonspecific    RADIOLOGY I have independently visualized and reviewed patient's CT head, chest x-ray as well as noted the radiology interpretation:   CT head: No ICH  Chest x-ray: Possible interstitial lung disease  Official radiology report(s): CT Head Wo Contrast  Result Date: 04/19/2021 CLINICAL DATA:  81 year old female with history of altered mental status. EXAM: CT HEAD WITHOUT CONTRAST TECHNIQUE: Contiguous axial images were obtained from the base of the skull through the vertex without intravenous contrast. RADIATION DOSE REDUCTION: This exam was performed according to the departmental dose-optimization program which includes automated exposure control, adjustment of the mA and/or kV according to patient size and/or use of iterative reconstruction technique. COMPARISON:  Head CT 12/28/2019. FINDINGS: Brain: Mild cerebral atrophy. Patchy areas of mild decreased attenuation are noted throughout the deep and periventricular white matter of the cerebral hemispheres bilaterally, compatible with mild chronic microvascular ischemic disease. No evidence of acute infarction, hemorrhage, hydrocephalus, extra-axial collection or mass lesion/mass effect. Vascular: No hyperdense vessel or unexpected calcification. Skull: Normal. Negative for fracture or focal lesion. Sinuses/Orbits: No acute finding. Chronic right mastoid effusion, unchanged.  Other: None. IMPRESSION: 1. No acute intracranial abnormalities. 2. Mild cerebral atrophy with mild chronic microvascular ischemic changes in the cerebral white matter, as above. Electronically Signed   By: Vinnie Langton M.D.   On: 04/19/2021 06:12   DG Chest Port 1 View  Result Date: 04/19/2021 CLINICAL DATA:  81 year old female with history of altered mental status. EXAM: PORTABLE CHEST 1 VIEW COMPARISON:  Chest x-ray 01/12/2021. FINDINGS: Lung volumes are low. Diffuse areas of interstitial prominence and widespread peribronchial cuffing, slightly increased compared to the prior study, particularly in the right mid to lower lung. No confluent consolidative airspace disease. No pleural effusions. No pneumothorax. No evidence of pulmonary edema. Heart size is mildly enlarged. The patient is rotated to the right on today's exam, resulting in distortion of the mediastinal contours and reduced diagnostic sensitivity and specificity for mediastinal pathology. Atherosclerotic calcifications are noted in the thoracic aorta. IMPRESSION: 1. The appearance of the lungs could suggests potential interstitial lung disease. Further evaluation with high-resolution chest CT is recommended in the near future to better evaluate these findings. 2. Aortic atherosclerosis. 3. Mild cardiomegaly. Electronically Signed   By: Vinnie Langton M.D.   On: 04/19/2021 06:15     PROCEDURES:  Critical Care performed: Yes, see critical care procedure note(s)  CRITICAL CARE Performed  by: Paulette Blanch   Total critical care time: 30 minutes  Critical care time was exclusive of separately billable procedures and treating other patients.  Critical care was necessary to treat or prevent imminent or life-threatening deterioration.  Critical care was time spent personally by me on the following activities: development of treatment plan with patient and/or surrogate as well as nursing, discussions with consultants, evaluation of  patient's response to treatment, examination of patient, obtaining history from patient or surrogate, ordering and performing treatments and interventions, ordering and review of laboratory studies, ordering and review of radiographic studies, pulse oximetry and re-evaluation of patient's condition.   [name]1-3 Lead EKG Interpretation Performed by: Paulette Blanch, MD Authorized by: Paulette Blanch, MD     Interpretation: normal     ECG rate:  70   ECG rate assessment: normal     Rhythm: sinus rhythm     Ectopy: none     Conduction: normal   Comments:     Patient placed on cardiac monitor to evaluate for arrhythmias   MEDICATIONS ORDERED IN ED: Medications  cefTRIAXone (ROCEPHIN) 1 g in sodium chloride 0.9 % 100 mL IVPB (1 g Intravenous New Bag/Given 04/19/21 0624)  lactated ringers bolus 1,000 mL (1,000 mLs Intravenous New Bag/Given 04/19/21 0625)     IMPRESSION / MDM / ASSESSMENT AND PLAN / ED COURSE  I reviewed the triage vital signs and the nursing notes.                             81 year old female brought to the ED from SNF for altered mental status. Differential diagnosis includes, but is not limited to, alcohol, illicit or prescription medications, or other toxic ingestion; intracranial pathology such as stroke or intracerebral hemorrhage; fever or infectious causes including sepsis; hypoxemia and/or hypercarbia; uremia; trauma; endocrine related disorders such as diabetes, hypoglycemia, and thyroid-related diseases; hypertensive encephalopathy; etc. I have personally reviewed patient's records and see that she had a recent PCP office visit for cough on 02/15/2021.  She had a vascular surgery office visit on 02/01/2021 for 4.6 cm infrarenal AAA and it was recommended to monitor her as she would be a poor surgical candidate.  The patient is on the cardiac monitor to evaluate for evidence of arrhythmia and/or significant heart rate changes.  We will obtain sepsis work-up, CT head, chest x-ray.   Patient is out of the window for tPA as her last known well was over 11 hours ago.  Clinical Course as of 04/19/21 0650  Tue Apr 19, 2021  0618 Patient back from CT scan more awake, now moving her arms and adjusting her bed sheets.  Making more effort to speak.  Husband at bedside.  CT head negative for intracranial hemorrhage.  Chest x-ray suggest possible interstitial lung disease.  Laboratory results notable for normal WBC, negative lactic acid, AKI creatinine 2.2 and large leukocyte positive UTI.  Will initiate treatment with IV Rocephin, fluids.  Will consult hospitalist services for evaluation and admission. [JS]  0630 Nurse informs me that patient was not on her baseline nasal cannula oxygen when she arrived to the ED.  Will check ABG to check for hypercapnia. [JS]  3474 CO2 on ABG 78.  Cannot find prior for comparison.  Likely patient was off her oxygen for period of time.  Although she demonstrates hypercapnia, she is currently awake and speaking in simple sentences.  We will hold BiPAP for now.  Will  order timed ABG in 2 hours. [JS]    Clinical Course User Index [JS] Paulette Blanch, MD     FINAL CLINICAL IMPRESSION(S) / ED DIAGNOSES   Final diagnoses:  Altered mental status, unspecified altered mental status type  Lower urinary tract infectious disease  AKI (acute kidney injury) ([place])  Hypercapnia     Rx / DC Orders   ED Discharge Orders     None        Note:  This document was prepared using Dragon voice recognition software and may include unintentional dictation errors.   Paulette Blanch, MD 04/19/21 [id number]    Paulette Blanch, MD 04/19/21 [phone]

## 2021-04-19 NOTE — NC FL2 (Signed)
[place] LEVEL OF CARE SCREENING TOOL     IDENTIFICATION  Patient Name: Katherine [name] Birthdate: 1940/09/03 Sex: female Admission Date (Current Location): 04/19/2021  [facility] and [place] Number:  Engineering geologist and Address:  [facility], [address] [place], [place] [address]      Provider Number: [id number]  Attending Physician Name and Address:  Patrecia Pour, MD  Relative Name and Phone Number:  Bennie Hind    Current Level of Care: Hospital Recommended Level of Care: [place] Prior Approval Number:    Date Approved/Denied:   PASRR Number:    Discharge Plan: SNF    Current Diagnoses: Patient Active Problem List   Diagnosis Date Noted   Acute on chronic respiratory failure with hypoxia and hypercapnia ([place]) 12/28/2019   Type 2 diabetes mellitus with stage 4 chronic kidney disease ([place]) 12/28/2019   Sepsis due to gram-negative UTI ([place]) 06/19/2019   Type II diabetes mellitus with renal manifestations ([place]) 06/19/2019   Stroke ([place]) 06/19/2019   AAA (abdominal aortic aneurysm) 66/29/4765   Acute metabolic encephalopathy 46/50/3546   Iron deficiency anemia    Altered mental status    Acute encephalopathy 02/11/2019   Sepsis secondary to UTI ([place]) 01/08/2019   Acute renal failure superimposed on stage 3b chronic kidney disease ([place]) 01/08/2019   Seizure disorder ([place]) 01/08/2019   Diabetes mellitus type 2, controlled, with complications ([place]) 56/81/2751   Acute on chronic anemia 01/03/2019   COVID-19 12/21/2018   COVID-19 virus infection 12/21/2018   Hemoptysis 12/20/2018   Pulmonary embolism ([place]) 11/21/2018   Delirium    UTI (urinary tract infection) 08/21/2018   Chest pain 07/15/2018   Palliative care encounter 05/10/2018   Localized edema 05/10/2018   Shortness of breath 05/10/2018   Hematemesis 04/28/2018   Influenza A 04/13/2018   Acute on chronic congestive heart failure ([place])    COPD  with acute exacerbation ([place]) 02/24/2018   Chronic diastolic heart failure ([place]) 12/31/2017   Lymphedema 12/31/2017   COPD exacerbation ([place]) 11/30/2017   Urinary tract infection 11/22/2017   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[place]) 11/16/2017   Diabetes mellitus type 2, uncomplicated ([place]) 70/02/7492   Pressure injury of skin 10/29/2017   Acute kidney injury superimposed on CKD ([place]) 10/24/2017   Sepsis ([place]) 10/24/2017   Possible Seizures ([place]) 10/08/2017   Hyperlipemia 10/08/2017   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   Stroke-like episode ([place]) s/p IV tpa 10/04/2017   Palliative care by specialist    Elevated rheumatoid factor 09/05/2017   Frequent hospital admissions 08/22/2017   Aphasia 06/28/2017   Right hand pain 03/21/2017   Dry skin 03/21/2017   Pain in finger of left hand 09/12/2016   Chronic fatigue 06/12/2016   Left knee pain 06/12/2016   Goals of care, counseling/discussion 03/13/2016   Primary localized osteoarthritis of right knee 02/08/2016   OSA (obstructive sleep apnea) 09/16/2015   Rotator cuff syndrome 09/07/2015   Pulmonary scarring 07/27/2015   Sleep disturbance 04/14/2015   Coronary artery disease 03/14/2015   Polyp of vocal cord 03/14/2015   Hypertensive heart disease without congestive heart failure 09/29/2014   Acute respiratory failure with hypoxia ([place]) 49/67/5916   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   Anxiety    Tobacco abuse    Prurigo nodularis    GERD (gastroesophageal reflux disease)    COPD (chronic obstructive pulmonary disease) (HCC)    Osteoarthritis    Vitamin D deficiency disease  Chronic constipation    CKD (chronic kidney disease), stage III (HCC)    Essential hypertension 05/20/2013   Morbid obesity ([place]) 05/20/2013   Pain in soft tissues of limb 05/20/2013    Orientation RESPIRATION BLADDER Height & Weight     Self, Place  O2 ([place] 2L) Incontinent Weight: 118.1 kg Height:  5\' 4"  (162.6 cm)   BEHAVIORAL SYMPTOMS/MOOD NEUROLOGICAL BOWEL NUTRITION STATUS      Incontinent Diet (see discharge)  AMBULATORY STATUS COMMUNICATION OF NEEDS Skin   Total Care Verbally Normal                       Personal Care Assistance Level of Assistance  Bathing, Feeding, Dressing, Total care Bathing Assistance: Maximum assistance Feeding assistance: Limited assistance Dressing Assistance: Maximum assistance Total Care Assistance: Maximum assistance   Functional Limitations Info             SPECIAL CARE FACTORS FREQUENCY                       Contractures Contractures Info: Not present    Additional Factors Info  Code Status, Allergies Code Status Info: Full Allergies Info: Bee Venom, enalapril           Current Medications (04/19/2021):  This is the current hospital active medication list Current Facility-Administered Medications  Medication Dose Route Frequency Provider Last Rate Last Admin   acetaminophen (TYLENOL) tablet 650 mg  650 mg Oral Q6H PRN Patrecia Pour, MD       Or   acetaminophen (TYLENOL) suppository 650 mg  650 mg Rectal Q6H PRN Patrecia Pour, MD       [START ON 04/20/2021] cefTRIAXone (ROCEPHIN) 2 g in sodium chloride 0.9 % 100 mL IVPB  2 g Intravenous Q24H Patrecia Pour, MD       [START ON 04/20/2021] fluticasone furoate-vilanterol (BREO ELLIPTA) 100-25 MCG/ACT 1 puff  1 puff Inhalation Daily Patrecia Pour, MD       And   [START ON 04/20/2021] umeclidinium bromide (INCRUSE ELLIPTA) 62.5 MCG/ACT 1 puff  1 puff Inhalation Daily Patrecia Pour, MD       heparin injection 5,000 Units  5,000 Units Subcutaneous Q8H Patrecia Pour, MD   5,000 Units at 04/19/21 1022   hydrALAZINE (APRESOLINE) injection 5 mg  5 mg Intravenous Q4H PRN Patrecia Pour, MD       insulin aspart (novoLOG) injection 0-5 Units  0-5 Units Subcutaneous QHS Patrecia Pour, MD       insulin aspart (novoLOG) injection 0-9 Units  0-9 Units Subcutaneous TID WC Patrecia Pour, MD        ipratropium-albuterol (DUONEB) 0.5-2.5 (3) MG/3ML nebulizer solution 3 mL  3 mL Nebulization Q6H PRN Patrecia Pour, MD       lacosamide (VIMPAT) tablet 100 mg  100 mg Oral BID Vance Gather B, MD   100 mg at 04/19/21 1022   lactated ringers infusion   Intravenous Continuous Patrecia Pour, MD 100 mL/hr at 04/19/21 0810 New Bag at 04/19/21 0810   levETIRAcetam (KEPPRA) tablet 750 mg  750 mg Oral BID Vance Gather B, MD   750 mg at 04/19/21 1022   ondansetron (ZOFRAN) tablet 4 mg  4 mg Oral Q6H PRN Patrecia Pour, MD       Or   ondansetron [facility]) injection 4 mg  4 mg Intravenous Q6H PRN Patrecia Pour, MD  sodium chloride flush (NS) 0.9 % injection 3 mL  3 mL Intravenous Q12H Patrecia Pour, MD       Current Outpatient Medications  Medication Sig Dispense Refill   ammonium lactate (AMLACTIN) 12 % cream Apply 1 application. topically at bedtime.     cetirizine (ZYRTEC) 10 MG tablet Take 10 mg by mouth daily.      Fluticasone-Umeclidin-Vilant 100-62.5-25 MCG/INH AEPB Inhale 1 puff into the lungs daily.     ipratropium-albuterol (DUONEB) 0.5-2.5 (3) MG/3ML SOLN Take 3 mLs by nebulization 3 (three) times daily. 250 mL 0   lacosamide 100 MG TABS Take 1 tablet (100 mg total) by mouth 2 (two) times daily. 60 tablet    levETIRAcetam (KEPPRA) 750 MG tablet Take 1 tablet (750 mg total) by mouth 2 (two) times daily.     lidocaine (LIDODERM) 5 % Place 1 patch onto the skin daily.     pantoprazole (PROTONIX) 20 MG tablet Take 20 mg by mouth daily.     propranolol (INDERAL) 10 MG tablet Take 10 mg by mouth 2 (two) times daily.     Saline GEL Place 3 sprays into both nostrils 3 (three) times daily.      sertraline (ZOLOFT) 25 MG tablet Take 25 mg by mouth daily.     Skin Protectants, Misc. (EUCERIN) cream Apply 1 application. topically at bedtime.     acetaminophen (TYLENOL) 650 MG CR tablet Take 650 mg by mouth 3 (three) times daily.     albuterol (ACCUNEB) 0.63 MG/3ML nebulizer solution Take 3 mLs by  nebulization every 8 (eight) hours as needed for wheezing.      atorvastatin (LIPITOR) 80 MG tablet Take 80 mg by mouth every evening.  (Patient not taking: Reported on 04/19/2021)     docusate sodium (COLACE) 100 MG capsule Take 100 mg by mouth 2 (two) times daily. (Patient not taking: Reported on 04/19/2021)     ferrous sulfate 325 (65 FE) MG tablet Take 325 mg by mouth 2 (two) times daily. (Patient not taking: Reported on 04/19/2021)     gabapentin (NEURONTIN) 300 MG capsule Take 300 mg by mouth at bedtime.     linagliptin (TRADJENTA) 5 MG TABS tablet Take 5 mg by mouth daily. (Patient not taking: Reported on 04/19/2021)     LORazepam (ATIVAN) 2 MG/ML injection Inject 0.5 mg into the vein daily as needed for seizure.     Menthol, Topical Analgesic, (BIOFREEZE) 4 % GEL Apply 1 application topically in the morning and at bedtime. To both knees      mupirocin ointment (BACTROBAN) 2 % Place 1 application into the nose 2 (two) times daily. (Patient not taking: Reported on 04/19/2021) 22 g 0   nitroGLYCERIN (NITROSTAT) 0.4 MG SL tablet Place 0.4 mg under the tongue every 5 (five) minutes as needed for chest pain.      pantoprazole (PROTONIX) 40 MG tablet Take 1 tablet (40 mg total) by mouth daily. (Patient not taking: Reported on 04/19/2021) 60 tablet 0   Saccharomyces boulardii (PROBIOTIC) 250 MG CAPS Take 1 capsule by mouth 2 times daily at 12 noon and 4 pm. (Patient not taking: Reported on 04/19/2021) 60 capsule 0   vitamin C (VITAMIN C) 500 MG tablet Take 1 tablet (500 mg total) by mouth daily. (Patient not taking: Reported on 04/19/2021) 30 tablet 0   zinc sulfate 220 (50 Zn) MG capsule Take 1 capsule (220 mg total) by mouth daily. (Patient not taking: Reported on 04/19/2021) 30 capsule 0  Discharge Medications: Please see discharge summary for a list of discharge medications.  Relevant Imaging Results:  Relevant Lab Results:   Additional Information SSN: [id number]  Shelbie Hutching, RN

## 2021-04-19 NOTE — Assessment & Plan Note (Addendum)
Resolved, unclear baseline though some cognitive impairment is noted in palliative notes as outpatient.  She has improved significantly and is much more now alert and oriented.  MRI with no evidence of acute infarction.  Cause felt to be secondary to hypercarbia ?

## 2021-04-19 NOTE — Assessment & Plan Note (Addendum)
Respiratory acidosis noted, previous pCO2 in 30-40's range. Had similar presentation with UTI leading to encephalopathy with respiratory acidosis previously. Baseline COPD and OHS. ?By 3/9, follow-up ABG noted resolution of respiratory acidosis and hypercarbia.  Patient will need BiPAP nightly ?

## 2021-04-19 NOTE — Assessment & Plan Note (Addendum)
On IV Rocephin.  Urine cultures growing out greater than 100,000 colonies of E. coli, pansensitive.  Completed 3 days of IV Rocephin by 3/9. ?

## 2021-04-19 NOTE — ED Notes (Signed)
Pt to CT

## 2021-04-19 NOTE — ED Notes (Signed)
ICU nurse called and she requests to call this RN back when available.  ?

## 2021-04-19 NOTE — TOC Initial Note (Signed)
Transition of Care (TOC) - Initial/Assessment Note  ? ? ?Patient Details  ?Name: Katherine [name] ?MRN: [id number] ?Date of Birth: [birth year] ? ?Transition of Care (TOC) CM/SW Contact:    ?Shelbie Hutching, RN ?Phone Number: ?04/19/2021, 2:43 PM ? ?Clinical Narrative:                 ?Patient is long term care at [facility], husband reports she has been there for 3 years.  Patient is admitted to the hospital for UTI, she has a history of COPD, and chronic diastolic heart failure.  Patient is on chronic oxygen at 2L [place] at the facility.  She is bed bound at baseline. ? ?Husband was at the bedside earlier and reports that she will discharge back to [facility] when medically cleared.  ? ?Expected Discharge Plan: [place] ?Barriers to Discharge: Continued Medical Work up ? ? ?Patient Goals and CMS Choice ?Patient states their goals for this hospitalization and ongoing recovery are:: patient unable to state at this time ?CMS Medicare.gov Compare Post Acute Care list provided to:: Patient Represenative (must comment) ?Choice offered to / list presented to : Spouse ? ?Expected Discharge Plan and Services ?Expected Discharge Plan: [place] ?  ?Discharge Planning Services: CM Consult ?  ?Living arrangements for the past 2 months: [place] ?                ?DME Arranged: N/A ?DME Agency: NA ?  ?  ?  ?HH Arranged: NA ?[place] Agency: NA ?  ?  ?  ? ?Prior Living Arrangements/Services ?Living arrangements for the past 2 months: [place] ?Lives with:: Facility Resident ?Patient language and need for interpreter reviewed:: Yes ?Do you feel safe going back to the place where you live?: Yes      ?Need for Family Participation in Patient Care: Yes (Comment) ?Care giver support system in place?: Yes (comment) (husband) ?  ?Criminal Activity/Legal Involvement Pertinent to Current Situation/Hospitalization: No - Comment as needed ? ?Activities of Daily  Living ?  ?  ? ?Permission Sought/Granted ?Permission sought to share information with : Case Manager, Family Supports, Customer service manager ?Permission granted to share information with : Yes, Verbal Permission Granted ? Share Information with NAME: Bennie Hind ? Permission granted to share info w AGENCY: [place] ? Permission granted to share info w Relationship: husband ? Permission granted to share info w Contact Information: [phone] ? ?Emotional Assessment ?Appearance:: Appears stated age ?  ?  ?Orientation: : Oriented to Self ?Alcohol / Substance Use: Not Applicable ?Psych Involvement: No (comment) ? ?Admission diagnosis:  UTI (urinary tract infection) [N39.0] ?Patient Active Problem List  ? Diagnosis Date Noted  ? Acute on chronic respiratory failure with hypoxia and hypercapnia ([place]) 12/28/2019  ? Type 2 diabetes mellitus with stage 4 chronic kidney disease ([place]) 12/28/2019  ? Sepsis due to gram-negative UTI ([place]) 06/19/2019  ? Type II diabetes mellitus with renal manifestations ([place]) 06/19/2019  ? Stroke ([place]) 06/19/2019  ? AAA (abdominal aortic aneurysm) 06/19/2019  ? Acute metabolic encephalopathy 07/86/7544  ? Iron deficiency anemia   ? Altered mental status   ? Acute encephalopathy 02/11/2019  ? Sepsis secondary to UTI ([place]) 01/08/2019  ? Acute renal failure superimposed on stage 3b chronic kidney disease ([place]) 01/08/2019  ? Seizure disorder ([place]) 01/08/2019  ? Diabetes mellitus type 2, controlled, with complications ([place]) 92/02/69  ? Acute on chronic anemia 01/03/2019  ? COVID-19 12/21/2018  ?  COVID-19 virus infection 12/21/2018  ? Hemoptysis 12/20/2018  ? Pulmonary embolism ([place]) 11/21/2018  ? Delirium   ? UTI (urinary tract infection) 08/21/2018  ? Chest pain 07/15/2018  ? Palliative care encounter 05/10/2018  ? Localized edema 05/10/2018  ? Shortness of breath 05/10/2018  ? Hematemesis 04/28/2018  ? Influenza A 04/13/2018  ? Acute on chronic congestive heart  failure ([place])   ? COPD with acute exacerbation ([place]) 02/24/2018  ? Chronic diastolic heart failure ([place]) 12/31/2017  ? Lymphedema 12/31/2017  ? COPD exacerbation ([place]) 11/30/2017  ? Urinary tract infection 11/22/2017  ? 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[place]) 11/16/2017  ? Diabetes mellitus type 2, uncomplicated ([place]) 14/97/0263  ? Pressure injury of skin 10/29/2017  ? Acute kidney injury superimposed on CKD ([place]) 10/24/2017  ? Sepsis ([place]) 10/24/2017  ? Possible Seizures ([place]) 10/08/2017  ? Hyperlipemia 10/08/2017  ? 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 Stroke-like episode ([place]) s/p IV tpa 10/04/2017  ? Palliative care by specialist   ? Elevated rheumatoid factor 09/05/2017  ? Frequent hospital admissions 08/22/2017  ? Aphasia 06/28/2017  ? Right hand pain 03/21/2017  ? Dry skin 03/21/2017  ? Pain in finger of left hand 09/12/2016  ? Chronic fatigue 06/12/2016  ? Left knee pain 06/12/2016  ? Goals of care, counseling/discussion 03/13/2016  ? Primary localized osteoarthritis of right knee 02/08/2016  ? OSA (obstructive sleep apnea) 09/16/2015  ? Rotator cuff syndrome 09/07/2015  ? Pulmonary scarring 07/27/2015  ? Sleep disturbance 04/14/2015  ? Coronary artery disease 03/14/2015  ? Polyp of vocal cord 03/14/2015  ? Hypertensive heart disease without congestive heart failure 09/29/2014  ? Acute respiratory failure with hypoxia ([place]) 09/16/2014  ? 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 Anxiety   ? Tobacco abuse   ? Prurigo nodularis   ? GERD (gastroesophageal reflux disease)   ? COPD (chronic obstructive pulmonary disease) ([place])   ? Osteoarthritis   ? Vitamin D deficiency disease   ? Chronic constipation   ? CKD (chronic kidney disease), stage III ([place])   ? Essential hypertension 05/20/2013  ? Morbid obesity ([place]) 05/20/2013  ? Pain in soft tissues of limb 05/20/2013  ? ?PCP:  [facility], [place] ?Pharmacy:   ?[facility] [address] ?[place]. [place] [address] ?Phone: [phone] Fax: [phone] ? ? ? ? ?Social Determinants of Health (SDOH) Interventions ?  ? ?Readmission Risk Interventions ?Readmission Risk Prevention Plan 12/29/2019 09/26/2019 11/26/2018  ?Transportation Screening Complete Complete Complete  ?Medication Review Press photographer) Complete Complete Complete  ?PCP or Specialist appointment within 3-5 days of discharge Not Complete - (No Data)  ?PCP/Specialist Appt Not Complete comments Will return to SNF - -  ?[place] or Home Care Consult Complete - (No Data)  ?SW Recovery Care/Counseling Consult Complete Complete Complete  ?Palliative Care Screening Not Applicable Not Applicable -  ?Skilled Nursing Facility Complete Not Applicable Complete  ?Some recent data might be hidden  ? ? ? ?

## 2021-04-19 NOTE — ED Notes (Signed)
RT called at this time for repeat ABG.  ?

## 2021-04-19 NOTE — Assessment & Plan Note (Addendum)
Placed on IV fluids.[name] Despite hx CHF, appears volume depleted on arrival.  By 3/9, volume repleted ?

## 2021-04-20 LAB — CBC
HCT: 36.1 % (ref 36.0–46.0)
Hemoglobin: 11 g/dL — ABNORMAL LOW (ref 12.0–15.0)
MCH: 29.6 pg (ref 26.0–34.0)
MCHC: 30.5 g/dL (ref 30.0–36.0)
MCV: 97.3 fL (ref 80.0–100.0)
Platelets: 144 10*3/uL — ABNORMAL LOW (ref 150–400)
RBC: 3.71 MIL/uL — ABNORMAL LOW (ref 3.87–5.11)
RDW: 12.7 % (ref 11.5–15.5)
WBC: 4.1 10*3/uL (ref 4.0–10.5)
nRBC: 0 % (ref 0.0–0.2)

## 2021-04-20 LAB — BLOOD GAS, ARTERIAL
Acid-Base Excess: 3.5 mmol/L — ABNORMAL HIGH (ref 0.0–2.0)
Bicarbonate: 32.4 mmol/L — ABNORMAL HIGH (ref 20.0–28.0)
O2 Content: 3 L/min
O2 Saturation: 98.4 %
Patient temperature: 37
pCO2 arterial: 69 mmHg (ref 32–48)
pH, Arterial: 7.28 — ABNORMAL LOW (ref 7.35–7.45)
pO2, Arterial: 89 mmHg (ref 83–108)

## 2021-04-20 LAB — BASIC METABOLIC PANEL
Anion gap: 5 (ref 5–15)
BUN: 30 mg/dL — ABNORMAL HIGH (ref 8–23)
CO2: 32 mmol/L (ref 22–32)
Calcium: 9.2 mg/dL (ref 8.9–10.3)
Chloride: 107 mmol/L (ref 98–111)
Creatinine, Ser: 1.74 mg/dL — ABNORMAL HIGH (ref 0.44–1.00)
GFR, Estimated: 29 mL/min — ABNORMAL LOW (ref >60)
Glucose, Bld: 86 mg/dL (ref 70–99)
Potassium: 4.5 mmol/L (ref 3.5–5.1)
Sodium: 144 mmol/L (ref 135–145)

## 2021-04-20 LAB — GLUCOSE, CAPILLARY
Glucose-Capillary: 82 mg/dL (ref 70–99)
Glucose-Capillary: 88 mg/dL (ref 70–99)
Glucose-Capillary: 108 mg/dL — ABNORMAL HIGH (ref 70–99)
Glucose-Capillary: 86 mg/dL (ref 70–99)

## 2021-04-20 LAB — C-REACTIVE PROTEIN: CRP: 0.6 mg/dL (ref <1.0)

## 2021-04-20 NOTE — Progress Notes (Signed)
0912 - Pt woke up this AM, removed Bipap and refused to have it back on despite education. Replaced on 2L [place] for now.  ?

## 2021-04-20 NOTE — TOC Progression Note (Signed)
Transition of Care (TOC) - Progression Note  ? ? ?Patient Details  ?Name: Katherine [name] ?MRN: [id number] ?Date of Birth: 07-14-1940 ? ?Transition of Care (TOC) CM/SW Contact  ?Shelbie Hutching, RN ?Phone Number: ?04/20/2021, 11:35 AM ? ?Clinical Narrative:    ?[name] over at [facility] - patient will probably need Bi Pap at facility.  [name] will order one once the orders have been placed and faxed over to [phone].   ? ? ?Expected Discharge Plan: [place] ?Barriers to Discharge: Continued Medical Work up ? ?Expected Discharge Plan and Services ?Expected Discharge Plan: [place] ?  ?Discharge Planning Services: CM Consult ?  ?Living arrangements for the past 2 months: [name] ?                ?DME Arranged: N/A ?DME Agency: NA ?  ?  ?  ?HH Arranged: NA ?[place] Agency: NA ?  ?  ?  ? ? ?Social Determinants of Health (SDOH) Interventions ?  ? ?Readmission Risk Interventions ?Readmission Risk Prevention Plan 12/29/2019 09/26/2019 11/26/2018  ?Transportation Screening Complete Complete Complete  ?Medication Review Press photographer) Complete Complete Complete  ?PCP or Specialist appointment within 3-5 days of discharge Not Complete - (No Data)  ?PCP/Specialist Appt Not Complete comments Will return to SNF - -  ?[place] or Home Care Consult Complete - (No Data)  ?SW Recovery Care/Counseling Consult Complete Complete Complete  ?Palliative Care Screening Not Applicable Not Applicable -  ?Skilled Nursing Facility Complete Not Applicable Complete  ?Some recent data might be hidden  ? ? ?

## 2021-04-20 NOTE — Assessment & Plan Note (Signed)
Blood pressure has been trending upward, in part due to fluid resuscitation. ?

## 2021-04-20 NOTE — Progress Notes (Signed)
[facility] Progress Note ? ?Patient: Katherine [name]    VVO:[id number]  DOA: 04/19/2021    ?Date of Service: the patient was seen and examined on 04/20/2021 ? ?Brief hospital course: ?81 year old female with past medical history of stage IIIb chronic kidney disease, diabetes mellitus, systolic heart failure and COPD with chronic respiratory failure on 2 L nasal cannula presented from skilled nursing facility with decreased responsiveness and found to be hypercarbic as well as UTI.  Patient started on BiPAP and antibiotics. ? ?Assessment and Plan: ?Assessment and Plan: ?* UTI (urinary tract infection) ?On IV Rocephin.  Urine cultures growing out greater than 100,000 colonies of E. coli, sensitivities pending. ? ?Acute metabolic encephalopathy ?Improving, unclear baseline though some cognitive impairment is noted in palliative notes as outpatient.  She has improved significantly and is much more now alert and oriented.  MRI with no evidence of acute infarction. ? ?Acute on chronic respiratory failure with hypoxia and hypercapnia (HCC) ?Respiratory acidosis noted, previous pCO2 in 30-40's range. Had similar presentation with UTI leading to encephalopathy with respiratory acidosis previously. Baseline COPD and OHS. ?Repeat ABG notes still respiratory acidosis although PCO2 and acid-base excess improved.  Closer to baseline. ? ?Acute renal failure superimposed on stage 3b chronic kidney disease ([place]) ?Continue IVF. Despite hx CHF, appears volume depleted on arrival. Will monitor volume status closely.  Close to baseline ? ?Essential hypertension ?Blood pressure has been trending upward, in part due to fluid resuscitation. ? ?Seizure disorder ([place]) ?No evidence of acute seizure activity.  Continued on Keppra. ? ?Chronic diastolic heart failure ([place]) ?BMP within normal limits.  Looks euvolemic. ? ?COPD (chronic obstructive pulmonary disease) ([place]) ?Known centrilobular and paraseptal emphysema for many years, no active  wheezing currently.  ?- Note abnormal CXR, consider HRCT after discharge to evaluate for ILD. ? ?Morbid obesity ([place]) ?Meets criteria BMI greater than 40 ? ? ? ? ? ? ?Body mass index is 42.08 kg/m?.  ?  ?   ? ?Consultants: ?Pulmonary ? ?Procedures: ?None ? ?Antimicrobials: ?IV Rocephin ? ?Code Status: Full code ? ? ?Subjective: Patient complains of being tired, otherwise no complaints ? ?Objective: ?Noted increasing blood pressures ?Vitals:  ? 04/20/21 1300 04/20/21 1400  ?BP: (!) 162/76 (!) 160/78  ?Pulse: 76 79  ?Resp: (!) 22 (!) 22  ?Temp:    ?SpO2: 94% 92%  ? ? ?Intake/Output Summary (Last 24 hours) at 04/20/2021 1631 ?Last data filed at 04/20/2021 1400 ?Gross per 24 hour  ?Intake 3643.65 ml  ?Output 800 ml  ?Net 2843.65 ml  ? ?Filed Weights  ? 04/19/21 0538 04/19/21 1900 04/20/21 0500  ?Weight: 118.1 kg 111.3 kg 111.2 kg  ? ?Body mass index is 42.08 kg/m?. ? ?Exam: ? ?General: Alert and oriented x2, no acute distress ?HEENT: Normocephalic and atraumatic, mucous membranes slightly dry, narrow airway ?Cardiovascular: Regular rate and rhythm, S1-S2 ?Respiratory: Decreased breath sounds throughout ?Abdomen: Soft, obese, nontender, positive bowel sounds ?Musculoskeletal: No clubbing cyanosis, 1+ pitting edema bilaterally ?Skin: No skin breaks, tears or lesions ?Psychiatry: Appropriate, no evidence of psychoses ?Neurology: No focal deficits ? ?Data Reviewed: ?Repeat ABG reviewed.  Respiratory acidosis although improving.  Creatinine down to 1.74.  Hemoglobin stable. ? ?Disposition:  ?Status is: Inpatient ?Continue inpatient as patient needs further respiratory stabilization.  Awaiting urine culture sensitivities.  Expect discharge in the next 48 hours ?  ? ?Family Communication: Left message for family ?DVT Prophylaxis: ?heparin injection 5,000 Units Start: 04/19/21 0915 ? ? ? ?Author: ?Annita Brod ,MD ?04/20/2021 4:31  PM ? ?To reach On-call, see care teams to locate the attending and reach out via  [email or web address]. ?Between 7PM-7AM, please contact night-coverage ?If you still have difficulty reaching the attending provider, please page the [facility] (Director on Call) for [facility] on amion for assistance. ? ?

## 2021-04-20 NOTE — Progress Notes (Signed)
CRITICAL CARE     Name: Katherine [name] MRN: [id number] DOB: May 27, 1940     LOS: 1   SUBJECTIVE FINDINGS & SIGNIFICANT EVENTS    Patient description:  81 yo F with PMH DM2, CKD, HTN, CVA, HFpEF, COPD/OSA overlap with chronic hypoxemia who came from SNF due to altered mental status with lethargy.  She had ABG done with acidemia and hypercapnia noted.  During interview she made attempt articulations with incomprehensible language on BIPAP. She is treated empirically for CAP and possible UTI due to pyuria. PCCM consult for further evaluation and management of hypercapnic hypoxemic respiratory failure with UTI and pneumonia.    04/20/21- patient is substantially improved. She is awake and lucid able to speak full conversation. PCCM will sign off and TRH is following.   Lines/tubes :   Microbiology/Sepsis markers: Results for orders placed or performed during the hospital encounter of 04/19/21  Culture, blood (routine x 2)     Status: None (Preliminary result)   Collection Time: 04/19/21  5:33 AM   Specimen: BLOOD  Result Value Ref Range Status   Specimen Description BLOOD RIGHT [facility]  Final   Special Requests   Final    BOTTLES DRAWN AEROBIC AND ANAEROBIC Blood Culture adequate volume   Culture   Final    NO GROWTH 1 DAY Performed at [facility], [address] [place], [place] [address]    Report Status PENDING  Incomplete  Culture, blood (routine x 2)     Status: None (Preliminary result)   Collection Time: 04/19/21  5:33 AM   Specimen: BLOOD  Result Value Ref Range Status   Specimen Description BLOOD RIGHT FA  Final   Special Requests   Final    BOTTLES DRAWN AEROBIC AND ANAEROBIC Blood Culture adequate volume   Culture   Final    NO GROWTH 1 DAY Performed at [facility], [address] [place], [place] [address]    Report Status PENDING  Incomplete  Urine Culture     Status: Abnormal (Preliminary result)   Collection Time: 04/19/21  5:33 AM   Specimen: In/Out Cath Urine  Result Value Ref Range Status   Specimen Description   Final    IN/OUT CATH URINE Performed at [facility], [address], [place], [place] [address]    Special Requests   Final    NONE Performed at [facility], [address], [place], [place] [address]    Culture (A)  Final    >=100,000 COLONIES/mL ESCHERICHIA COLI SUSCEPTIBILITIES TO FOLLOW Performed at [facility], [place] [address] [place], [place] [address]    Report Status PENDING  Incomplete  Resp Panel by RT-PCR (Flu A&B, Covid) Nasopharyngeal Swab     Status: None   Collection Time: 04/19/21  5:33 AM   Specimen: Nasopharyngeal Swab; Nasopharyngeal(NP) swabs in vial transport medium  Result Value Ref Range Status   SARS Coronavirus 2 by RT PCR NEGATIVE NEGATIVE Final    Comment: (NOTE) SARS-CoV-2 target nucleic acids are NOT DETECTED.  The SARS-CoV-2 RNA is generally detectable in upper respiratory specimens during the acute phase of infection. The lowest concentration of SARS-CoV-2 viral copies this assay can detect is 138 copies/mL. A negative result does not preclude SARS-Cov-2 infection and should not be used as the sole basis for treatment or other patient management decisions. A negative result may occur with  improper specimen collection/handling, submission of specimen other than nasopharyngeal swab, presence of viral mutation(s)  within the areas targeted by this assay, and inadequate number of viral copies(<138 copies/mL). A negative result must be combined with clinical observations, patient history, and epidemiological information. The expected result is Negative.  Fact Sheet for Patients:  EntrepreneurPulse.com.au  Fact Sheet for Healthcare Providers:   IncredibleEmployment.be  This test is no t yet approved or cleared by the [place] FDA and  has been authorized for detection and/or diagnosis of SARS-CoV-2 by FDA under an Emergency Use Authorization (EUA). This EUA will remain  in effect (meaning this test can be used) for the duration of the COVID-19 declaration under Section 564(b)(1) of the Act, 21 U.S.C.section 360bbb-3(b)(1), unless the authorization is terminated  or revoked sooner.       Influenza A by PCR NEGATIVE NEGATIVE Final   Influenza B by PCR NEGATIVE NEGATIVE Final    Comment: (NOTE) The Xpert Xpress SARS-CoV-2/FLU/RSV plus assay is intended as an aid in the diagnosis of influenza from Nasopharyngeal swab specimens and should not be used as a sole basis for treatment. Nasal washings and aspirates are unacceptable for Xpert Xpress SARS-CoV-2/FLU/RSV testing.  Fact Sheet for Patients: EntrepreneurPulse.com.au  Fact Sheet for Healthcare Providers: IncredibleEmployment.be  This test is not yet approved or cleared by the [place] FDA and has been authorized for detection and/or diagnosis of SARS-CoV-2 by FDA under an Emergency Use Authorization (EUA). This EUA will remain in effect (meaning this test can be used) for the duration of the COVID-19 declaration under Section 564(b)(1) of the Act, 21 U.S.C. section 360bbb-3(b)(1), unless the authorization is terminated or revoked.  Performed at [facility], [address] [place], [place] [address]   MRSA Next Gen by PCR, Nasal     Status: Abnormal   Collection Time: 04/19/21  7:05 PM   Specimen: Nasal Mucosa; Nasal Swab  Result Value Ref Range Status   MRSA by PCR Next Gen DETECTED (A) NOT DETECTED Final    Comment: RESULT CALLED TO, READ BACK BY AND VERIFIED WITH: Hansel Starling @2031  on 04/19/21 skl/ss (NOTE) The GeneXpert MRSA Assay (FDA approved for NASAL specimens only), is one  component of a comprehensive MRSA colonization surveillance program. It is not intended to diagnose MRSA infection nor to guide or monitor treatment for MRSA infections. Test performance is not FDA approved in patients less than 6 years old. Performed at [facility], [address] [place], [place] [address]     Anti-infectives:  Anti-infectives (From admission, onward)    Start     Dose/Rate Route Frequency Ordered Stop   04/20/21 0600  cefTRIAXone (ROCEPHIN) 2 g in sodium chloride 0.9 % 100 mL IVPB       Note to Pharmacy: to make 2g total dose   2 g 200 mL/hr over 30 Minutes Intravenous Every 24 hours 04/19/21 0905     04/19/21 0715  cefTRIAXone (ROCEPHIN) 1 g in sodium chloride 0.9 % 100 mL IVPB       Note to Pharmacy: to make 2g total dose   1 g 200 mL/hr over 30 Minutes Intravenous  Once 04/19/21 0714 04/19/21 0810   04/19/21 0630  cefTRIAXone (ROCEPHIN) 1 g in sodium chloride 0.9 % 100 mL IVPB        1 g 200 mL/hr over 30 Minutes Intravenous  Once 04/19/21 0616 04/19/21 0654        Consults: Treatment Team:  Pccm, Ander Gaster, MD Ottie Glazier, MD      PAST MEDICAL HISTORY   Past Medical History:  Diagnosis Date   Angioedema    Anxiety and depression    CHF (congestive heart failure) (HCC)    Chronic constipation    Chronic kidney disease    stage 3   COPD (chronic obstructive pulmonary disease) (HCC)    Diabetes mellitus without complication (HCC)    Gallstones    GERD (gastroesophageal reflux disease)    Hypertension    Left ventricular hypertrophy    Osteoarthritis    Prurigo nodularis    Seizures ([place])    Stroke ([place])    Tobacco abuse    Vitamin D deficiency disease      SURGICAL HISTORY   Past Surgical History:  Procedure Laterality Date   CHOLECYSTECTOMY     POLYPECTOMY  11/2011   vocal cord   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   Procedure: TOTAL KNEE ARTHROPLASTY;  Surgeon: Hessie Knows, MD;  Location: [facility] ORS;   Service: Orthopedics;  Laterality: Right;     FAMILY HISTORY   Family History  Problem Relation Age of Onset   Alcohol abuse Mother    Sickle cell anemia Daughter    Hypertension Son    Cancer Neg Hx    COPD Neg Hx    Diabetes Neg Hx    Heart disease Neg Hx    Stroke Neg Hx      SOCIAL HISTORY   Social History   Tobacco Use   Smoking status: Former    Packs/day: 0.50    Years: 60.00    Pack years: 30.00    Types: Cigarettes   Smokeless tobacco: Never   Tobacco comments:    quite 23mo ago-03/26/18  Vaping Use   Vaping Use: Never used  Substance Use Topics   Alcohol use: No    Alcohol/week: 0.0 standard drinks    Comment: rare   Drug use: No     MEDICATIONS   Current Medication:  Current Facility-Administered Medications:    acetaminophen (TYLENOL) tablet 650 mg, 650 mg, Oral, Q6H PRN **OR** acetaminophen (TYLENOL) suppository 650 mg, 650 mg, Rectal, Q6H PRN, Patrecia Pour, MD   cefTRIAXone (ROCEPHIN) 2 g in sodium chloride 0.9 % 100 mL IVPB, 2 g, Intravenous, Q24H, Patrecia Pour, MD, Stopped at 04/20/21 0544   Chlorhexidine Gluconate Cloth 2 % PADS 6 each, 6 each, Topical, Q0600, Ottie Glazier, MD, 6 each at 04/20/21 0514   fluticasone furoate-vilanterol (BREO ELLIPTA) 100-25 MCG/ACT 1 puff, 1 puff, Inhalation, Daily, 1 puff at 04/20/21 0908 **AND** umeclidinium bromide (INCRUSE ELLIPTA) 62.5 MCG/ACT 1 puff, 1 puff, Inhalation, Daily, Vance Gather B, MD, 1 puff at 04/20/21 0909   haloperidol lactate (HALDOL) injection 2 mg, 2 mg, Intravenous, Q6H PRN, Dwyane Dee, MD, 2 mg at 04/19/21 2143   heparin injection 5,000 Units, 5,000 Units, Subcutaneous, Q8H, Patrecia Pour, MD, 5,000 Units at 04/20/21 1359   hydrALAZINE (APRESOLINE) injection 5 mg, 5 mg, Intravenous, Q4H PRN, Patrecia Pour, MD   insulin aspart (novoLOG) injection 0-5 Units, 0-5 Units, Subcutaneous, QHS, Bonner Puna, Ryan B, MD   insulin aspart (novoLOG) injection 0-9 Units, 0-9 Units, Subcutaneous, TID WC,  Bonner Puna, Ryan B, MD   ipratropium-albuterol (DUONEB) 0.5-2.5 (3) MG/3ML nebulizer solution 3 mL, 3 mL, Nebulization, Q6H PRN, Patrecia Pour, MD   lacosamide (VIMPAT) 100 mg in sodium chloride 0.9 % 25 mL IVPB, 100 mg, Intravenous, BID, Dwyane Dee, MD, Stopped at 04/20/21 4332   lactated ringers infusion, , Intravenous, Continuous, Patrecia Pour, MD, Last Rate: 100 mL/hr at 04/20/21  1400, Infusion Verify at 04/20/21 1400   levETIRAcetam (KEPPRA) 750 mg in sodium chloride 0.9 % 100 mL IVPB, 750 mg, Intravenous, BID, Dwyane Dee, MD, Paused at 04/20/21 0859   mupirocin ointment (BACTROBAN) 2 %, , Nasal, BID, Patrecia Pour, MD, Given at 04/20/21 0842   ondansetron (ZOFRAN) tablet 4 mg, 4 mg, Oral, Q6H PRN **OR** ondansetron (ZOFRAN) injection 4 mg, 4 mg, Intravenous, Q6H PRN, Patrecia Pour, MD   sodium chloride flush (NS) 0.9 % injection 3 mL, 3 mL, Intravenous, Q12H, Vance Gather B, MD, 3 mL at 04/20/21 0842    ALLERGIES   Bee venom and Enalapril maleate    REVIEW OF SYSTEMS     ROS unable to obtain due to severe lethargy GCS9   PHYSICAL EXAMINATION   Vital Signs: Temp:  [98.3 F (36.8 C)-99 F (37.2 C)] 99 F (37.2 C) (03/08 1121) Pulse Rate:  [67-81] 79 (03/08 1400) Resp:  [12-23] 22 (03/08 1400) BP: (113-163)/(54-110) 160/78 (03/08 1400) SpO2:  [92 %-100 %] 92 % (03/08 1400) FiO2 (%):  [30 %] 30 % (03/08 0728) Weight:  [111.2 kg-111.3 kg] 111.2 kg (03/08 0500)  GENERAL:Older then stated age, encephalopathic no apparent distress HEAD: Normocephalic, atraumatic.  EYES: Pupils equal, round, reactive to light.  No scleral icterus.  MOUTH: Moist mucosal membrane. NECK: Supple. No thyromegaly. No nodules. No JVD.  PULMONARY: decreased breath sounds bilaterally  CARDIOVASCULAR: S1 and S2. Regular rate and rhythm. No murmurs, rubs, or gallops.  GASTROINTESTINAL: Soft, nontender, non-distended. No masses. Positive bowel sounds. No hepatosplenomegaly.  MUSCULOSKELETAL: No  swelling, clubbing, or edema.  NEUROLOGIC: Mild distress due to acute illness SKIN:intact,warm,dry   PERTINENT DATA     Infusions:  cefTRIAXone (ROCEPHIN)  IV Stopped (04/20/21 0544)   lacosamide (VIMPAT) IV Stopped (04/20/21 6295)   lactated ringers 100 mL/hr at 04/20/21 1400   levETIRAcetam Stopped (04/20/21 0859)   Scheduled Medications:  Chlorhexidine Gluconate Cloth  6 each Topical Q0600   fluticasone furoate-vilanterol  1 puff Inhalation Daily   And   umeclidinium bromide  1 puff Inhalation Daily   heparin  5,000 Units Subcutaneous Q8H   insulin aspart  0-5 Units Subcutaneous QHS   insulin aspart  0-9 Units Subcutaneous TID WC   mupirocin ointment   Nasal BID   sodium chloride flush  3 mL Intravenous Q12H   PRN Medications: acetaminophen **OR** acetaminophen, haloperidol lactate, hydrALAZINE, ipratropium-albuterol, ondansetron **OR** ondansetron (ZOFRAN) IV Hemodynamic parameters:   Intake/Output: 03/07 0701 - 03/08 0700 In: 2426.4 [I.V.:2183.4; IV Piggyback:243] Out: 600 [Urine:600]  Ventilator  Settings: FiO2 (%):  [30 %] 30 %  LAB RESULTS:  Basic Metabolic Panel: Recent Labs  Lab 04/19/21 0533 04/20/21 0421  NA 144 144  K 5.0 4.5  CL 106 107  CO2 32 32  GLUCOSE 105* 86  BUN 35* 30*  CREATININE 2.20* 1.74*  CALCIUM 9.4 9.2    Liver Function Tests: Recent Labs  Lab 04/19/21 0533  AST 15  ALT 11  ALKPHOS 96  BILITOT 0.3  PROT 7.2  ALBUMIN 3.2*    No results for input(s): LIPASE, AMYLASE in the last 168 hours. No results for input(s): AMMONIA in the last 168 hours. CBC: Recent Labs  Lab 04/19/21 0533 04/20/21 0421  WBC 5.2 4.1  NEUTROABS 2.4  --   HGB 12.3 11.0*  HCT 40.7 36.1  MCV 99.3 97.3  PLT 165 144*    Cardiac Enzymes: No results for input(s): CKTOTAL, CKMB, CKMBINDEX, TROPONINI in the last  168 hours. BNP: Invalid input(s): POCBNP CBG: Recent Labs  Lab 04/19/21 1138 04/19/21 1906 04/19/21 2148 04/20/21 0750  04/20/21 1120  GLUCAP 99 87 104* 82 88        IMAGING RESULTS:  Imaging: CT Head Wo Contrast  Result Date: 04/19/2021 CLINICAL DATA:  81 year old female with history of altered mental status. EXAM: CT HEAD WITHOUT CONTRAST TECHNIQUE: Contiguous axial images were obtained from the base of the skull through the vertex without intravenous contrast. RADIATION DOSE REDUCTION: This exam was performed according to the departmental dose-optimization program which includes automated exposure control, adjustment of the mA and/or kV according to patient size and/or use of iterative reconstruction technique. COMPARISON:  Head CT 12/28/2019. FINDINGS: Brain: Mild cerebral atrophy. Patchy areas of mild decreased attenuation are noted throughout the deep and periventricular white matter of the cerebral hemispheres bilaterally, compatible with mild chronic microvascular ischemic disease. No evidence of acute infarction, hemorrhage, hydrocephalus, extra-axial collection or mass lesion/mass effect. Vascular: No hyperdense vessel or unexpected calcification. Skull: Normal. Negative for fracture or focal lesion. Sinuses/Orbits: No acute finding. Chronic right mastoid effusion, unchanged. Other: None. IMPRESSION: 1. No acute intracranial abnormalities. 2. Mild cerebral atrophy with mild chronic microvascular ischemic changes in the cerebral white matter, as above. Electronically Signed   By: Vinnie Langton M.D.   On: 04/19/2021 06:12   MR BRAIN WO CONTRAST  Result Date: 04/19/2021 CLINICAL DATA:  Mental status change, unknown cause. EXAM: MRI HEAD WITHOUT CONTRAST TECHNIQUE: Multiplanar, multiecho pulse sequences of the brain and surrounding structures were obtained without intravenous contrast. COMPARISON:  Prior head CT examinations 04/19/2021 and earlier. Brain MRI 02/11/2019. FINDINGS: Intermittently motion degraded examination. Most notably, there is moderate motion degradation of the sagittal T1 weighted sequence  and mild motion degradation of the axial T1 weighted sequence. Brain: No age advanced or lobar predominant cerebral atrophy. Moderate multifocal T2 FLAIR hyperintense signal abnormality within the cerebral white matter and pons, nonspecific but compatible chronic small vessel disease. As before, chronic small vessel ischemic changes are also present within the right basal ganglia. Partially empty sella turcica. There is no acute infarct. No evidence of an intracranial mass. No chronic intracranial blood products. No extra-axial fluid collection. No midline shift. Vascular: Maintained flow voids within the proximal large arterial vessels. Skull and upper cervical spine: No focal suspicious marrow lesion. Incompletely assessed cervical spondylosis. Sinuses/Orbits: Bilateral proptosis. Small mucous retention cyst within the left sphenoid sinus. Mild mucosal thickening within the bilateral ethmoid sinuses. Other: Right mastoid effusion. Small-volume fluid also present within the left mastoid air cells. IMPRESSION: No evidence of acute intracranial abnormality. Moderate chronic small vessel ischemic changes within the cerebral white matter and pons. Chronic small vessel ischemic changes are also present within the right basal ganglia. These findings have not significantly changed from the prior brain MRI of 02/11/2019. Bilateral proptosis. Mild paranasal sinus disease, as described. Right mastoid effusion. Small-volume fluid also present within the left mastoid air cells. Electronically Signed   By: Kellie Simmering D.O.   On: 04/19/2021 10:23   DG Chest Port 1 View  Result Date: 04/19/2021 CLINICAL DATA:  81 year old female with history of altered mental status. EXAM: PORTABLE CHEST 1 VIEW COMPARISON:  Chest x-ray 01/12/2021. FINDINGS: Lung volumes are low. Diffuse areas of interstitial prominence and widespread peribronchial cuffing, slightly increased compared to the prior study, particularly in the right mid to lower  lung. No confluent consolidative airspace disease. No pleural effusions. No pneumothorax. No evidence of pulmonary edema. Heart size is  mildly enlarged. The patient is rotated to the right on today's exam, resulting in distortion of the mediastinal contours and reduced diagnostic sensitivity and specificity for mediastinal pathology. Atherosclerotic calcifications are noted in the thoracic aorta. IMPRESSION: 1. The appearance of the lungs could suggests potential interstitial lung disease. Further evaluation with high-resolution chest CT is recommended in the near future to better evaluate these findings. 2. Aortic atherosclerosis. 3. Mild cardiomegaly. Electronically Signed   By: Vinnie Langton M.D.   On: 04/19/2021 06:15   @PROBHOSP @ No results found.      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 -continue QHS bipap -continue Bronchodilator Therapy -empiric rocephin  -MRSA pcr -procalcitonin is of limited utility in context of CKD -BNP and TTE to evaluate cardiac dysfunction interval changes from prior study with poor diastology    Acute on chronic COPD  -[place]b  -ICS/LABA via Dulera   - IS /flutter with Metaneb   Urinary tract infection     - symptomatic pyuria    - Roceophin IV  Deforming arthropathy    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 - no hx of AI disease    -- inflammatory biomarkers as well as Rheumatoid factor and Anti CCP for RA workup   - empirically start solumedrol due to hx of Advanced COPD    Renal Failure- CKD 3b -follow chem 7 -follow UO -continue Foley Catheter-assess need daily -[place] non essential nephrotoxin  NEUROLOGY - history of CVA with aphasia  - PT/OT Wake up assessment pending ID -continue IV abx as prescibed -follow up cultures  GI/Nutrition GI PROPHYLAXIS as indicated DIET-->TF's as tolerated Constipation protocol as indicated  ENDO - ICU  hypoglycemic\Hyperglycemia protocol -check FSBS per protocol   ELECTROLYTES -follow labs as needed -replace as needed -pharmacy consultation   DVT/GI PRX ordered -SCDs  TRANSFUSIONS AS NEEDED MONITOR FSBS ASSESS the need for LABS as needed   Critical care provider statement:   Total critical care time: 33 minutes   Performed by: Lanney Gins MD   Critical care time was exclusive of separately billable procedures and treating other patients.   Critical care was necessary to treat or prevent imminent or life-threatening deterioration.   Critical care was time spent personally by me on the following activities: development of treatment plan with patient and/or surrogate as well as nursing, discussions with consultants, evaluation of patient's response to treatment, examination of patient, obtaining history from patient or surrogate, ordering and performing treatments and interventions, ordering and review of laboratory studies, ordering and review of radiographic studies, pulse oximetry and re-evaluation of patient's condition.      Ottie Glazier, M.D.  Pulmonary & Critical Care Medicine      This document was prepared using Dragon voice recognition software and may include unintentional dictation errors.

## 2021-04-20 NOTE — Progress Notes (Signed)
RN informed me that the pt removed her Bipap and refuses to place it back on. Bipap remains at bedside. ?

## 2021-04-20 NOTE — Assessment & Plan Note (Addendum)
BMP within normal limits.  Looked dry ?

## 2021-04-20 NOTE — Assessment & Plan Note (Signed)
No evidence of acute seizure activity.  Continued on Keppra. ?

## 2021-04-20 NOTE — Hospital Course (Addendum)
81 year old female with past medical history of stage IIIb chronic kidney disease, diabetes mellitus, systolic heart failure and COPD with chronic respiratory failure on 2 L nasal cannula presented from skilled nursing facility with decreased responsiveness and found to be hypercarbic as well as UTI.  Patient started on BiPAP and antibiotics. ? ?By 3/8, mentating much more clearly.  By 3/9, respiratory acidosis had resolved and patient had completed course of antibiotics.  Urine had grown out pansensitive E. coli. ?

## 2021-04-20 NOTE — Assessment & Plan Note (Signed)
.    Meets criteria BMI greater than 40 

## 2021-04-21 DIAGNOSIS — I10 Essential (primary) hypertension: Secondary | ICD-10-CM | POA: Diagnosis not present

## 2021-04-21 DIAGNOSIS — H9190 Unspecified hearing loss, unspecified ear: Secondary | ICD-10-CM | POA: Diagnosis not present

## 2021-04-21 DIAGNOSIS — G9341 Metabolic encephalopathy: Secondary | ICD-10-CM | POA: Diagnosis not present

## 2021-04-21 DIAGNOSIS — N390 Urinary tract infection, site not specified: Secondary | ICD-10-CM | POA: Diagnosis not present

## 2021-04-21 DIAGNOSIS — J449 Chronic obstructive pulmonary disease, unspecified: Secondary | ICD-10-CM | POA: Diagnosis not present

## 2021-04-21 DIAGNOSIS — G40909 Epilepsy, unspecified, not intractable, without status epilepticus: Secondary | ICD-10-CM | POA: Diagnosis not present

## 2021-04-21 LAB — BLOOD GAS, ARTERIAL
Acid-Base Excess: 7.7 mmol/L — ABNORMAL HIGH (ref 0.0–2.0)
Bicarbonate: 32.7 mmol/L — ABNORMAL HIGH (ref 20.0–28.0)
O2 Content: 2 L/min
O2 Saturation: 97.7 %
Patient temperature: 37
pCO2 arterial: 46 mmHg (ref 32–48)
pH, Arterial: 7.46 — ABNORMAL HIGH (ref 7.35–7.45)
pO2, Arterial: 80 mmHg — ABNORMAL LOW (ref 83–108)

## 2021-04-21 LAB — RESP PANEL BY RT-PCR (FLU A&B, COVID) ARPGX2
Influenza A by PCR: NEGATIVE
Influenza B by PCR: NEGATIVE
SARS Coronavirus 2 by RT PCR: NEGATIVE

## 2021-04-21 LAB — URINE CULTURE: Culture: >=100000 — AB

## 2021-04-21 LAB — GLUCOSE, CAPILLARY
Glucose-Capillary: 105 mg/dL — ABNORMAL HIGH (ref 70–99)
Glucose-Capillary: 96 mg/dL (ref 70–99)

## 2021-04-21 LAB — CYCLIC CITRUL PEPTIDE ANTIBODY, IGG/IGA: CCP Antibodies IgG/IgA: 25 units — ABNORMAL HIGH (ref 0–19)

## 2021-04-21 LAB — RHEUMATOID FACTOR: Rheumatoid fact SerPl-aCnc: 214.8 IU/mL — ABNORMAL HIGH (ref <14.0)

## 2021-04-21 MED ORDER — MUPIROCIN 2 % EX OINT: TOPICAL_OINTMENT | Freq: Two times a day (BID) | CUTANEOUS | 0 refills | Status: AC

## 2021-04-21 NOTE — TOC Transition Note (Signed)
Transition of Care (TOC) - CM/SW Discharge Note ? ? ?Patient Details  ?Name: Katherine [name] ?MRN: [id number] ?Date of Birth: August 29, 1940 ? ?Transition of Care (TOC) CM/SW Contact:  ?Beverly Sessions, RN ?Phone Number: ?04/21/2021, 1:59 PM ? ? ?Clinical Narrative:    ? ?Patient will DC to: [facility]  ?Anticipated DC date: 04/21/21 ? ?Family notified:husband ?Transport by: [facility] ? ?Per MD patient ready for DC to . RN, patient, patient's family, and facility notified of DC. Discharge Summary sent to facility. RN given number for report. DC packet on chart. Ambulance transport requested for patient.  ?TOC signing off. ? ?Isaias Cowman RNCM ?[phone] ? ? ?  ?Barriers to Discharge: Continued Medical Work up ? ? ?Patient Goals and CMS Choice ?Patient states their goals for this hospitalization and ongoing recovery are:: patient unable to state at this time ?CMS Medicare.gov Compare Post Acute Care list provided to:: Patient Represenative (must comment) ?Choice offered to / list presented to : Spouse ? ?Discharge Placement ?  ?           ?  ?  ?  ?  ? ?Discharge Plan and Services ?  ?Discharge Planning Services: CM Consult ?           ?DME Arranged: N/A ?DME Agency: NA ?  ?  ?  ?HH Arranged: NA ?[place] Agency: NA ?  ?  ?  ? ?Social Determinants of Health (SDOH) Interventions ?  ? ? ?Readmission Risk Interventions ?Readmission Risk Prevention Plan 12/29/2019 09/26/2019 11/26/2018  ?Transportation Screening Complete Complete Complete  ?Medication Review Press photographer) Complete Complete Complete  ?PCP or Specialist appointment within 3-5 days of discharge Not Complete - (No Data)  ?PCP/Specialist Appt Not Complete comments Will return to SNF - -  ?[place] or Home Care Consult Complete - (No Data)  ?SW Recovery Care/Counseling Consult Complete Complete Complete  ?Palliative Care Screening Not Applicable Not Applicable -  ?Skilled Nursing Facility Complete Not Applicable Complete  ?Some recent data might be hidden   ? ? ? ? ? ?

## 2021-04-21 NOTE — Plan of Care (Signed)
?  Problem: Education: ?Goal: Knowledge of General Education information will improve ?Description: Including pain rating scale, medication(s)/side effects and non-pharmacologic comfort measures ?Outcome: Not Progressing ?  ?Problem: Health Behavior/Discharge Planning: ?Goal: Ability to manage health-related needs will improve ?Outcome: Not Progressing ?  ?Problem: Clinical Measurements: ?Goal: Ability to maintain clinical measurements within normal limits will improve ?Outcome: Progressing ?Goal: Will remain free from infection ?Outcome: Progressing ?Goal: Diagnostic test results will improve ?Outcome: Progressing ?Goal: Respiratory complications will improve ?Outcome: Progressing ?Goal: Cardiovascular complication will be avoided ?Outcome: Progressing ?  ?Problem: Activity: ?Goal: Risk for activity intolerance will decrease ?Outcome: Not Progressing ?  ?Problem: Nutrition: ?Goal: Adequate nutrition will be maintained ?Outcome: Progressing ?  ?Problem: Coping: ?Goal: Level of anxiety will decrease ?Outcome: Not Progressing ?  ?Problem: Elimination: ?Goal: Will not experience complications related to bowel motility ?Outcome: Progressing ?Goal: Will not experience complications related to urinary retention ?Outcome: Progressing ?  ?Problem: Pain Managment: ?Goal: General experience of comfort will improve ?Outcome: Progressing ?  ?Problem: Safety: ?Goal: Ability to remain free from injury will improve ?Outcome: Progressing ?  ?Problem: Skin Integrity: ?Goal: Risk for impaired skin integrity will decrease ?Outcome: Progressing ?  ?

## 2021-04-21 NOTE — Discharge Summary (Addendum)
Physician Discharge Summary   Patient: Katherine [name] MRN: [id number] DOB: March 25, 1940  Admit date:     04/19/2021  Discharge date:   Discharge Physician: Annita Brod   PCP: [facility], [place]   Recommendations at discharge:   Patient returning back to her skilled nursing facility. Patient will be on nightly BiPAP.  Settings are as follows: BiPAP/CPAP/SIPAP BiPAP/CPAP/SIPAP Pt Type Adult Mask Type Full face mask Mask Size Medium Set Rate 8 breaths/min Respiratory Rate 16 breaths/min IPAP 20 cmH20 EPAP 10 cmH2O Oxygen Percent 30 % Minute Ventilation 5 Leak 0 Peak Inspiratory Pressure (PIP) 20 Tidal Volume (Vt) 327 BiPAP/CPAP/SIPAP BiPAP Patient Home Equipment No Auto Titrate No Press High Alarm 25 cmH2O Press Low Alarm 2 cmH2O  Discharge Diagnoses: Principal Problem:   UTI (urinary tract infection) Active Problems:   Acute renal failure superimposed on stage 3b chronic kidney disease (HCC)   Essential hypertension   Chronic diastolic heart failure (HCC)   Seizure disorder (HCC)   COPD (chronic obstructive pulmonary disease) (HCC)   Morbid obesity (HCC)   Hyperlipemia   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HCC)   Diabetes mellitus type 2, controlled, with complications (HCC)   Acute respiratory failure with hypoxia and hypercapnia (HCC)  Resolved Problems:   Acute metabolic encephalopathy   Acute on chronic respiratory failure with hypoxia and hypercapnia [facility])  Hospital Course: 81 year old female with past medical history of stage IIIb chronic kidney disease, diabetes mellitus, systolic heart failure and COPD with chronic respiratory failure on 2 L nasal cannula presented from skilled nursing facility with decreased responsiveness and found to be hypercarbic as well as UTI.  Patient started on BiPAP and antibiotics.  By 3/8, mentating much more clearly.  By 3/9, respiratory acidosis had resolved and patient had completed course of antibiotics.  Urine had  grown out pansensitive E. coli.  Assessment and Plan: * UTI (urinary tract infection) On IV Rocephin.  Urine cultures growing out greater than 100,000 colonies of E. coli, pansensitive.  Completed 3 days of IV Rocephin by 3/9.  Acute metabolic encephalopathy-resolved as of 04/21/2021 Resolved, unclear baseline though some cognitive impairment is noted in palliative notes as outpatient.  She has improved significantly and is much more now alert and oriented.  MRI with no evidence of acute infarction.  Cause felt to be secondary to hypercarbia  Acute on chronic respiratory failure with hypoxia and hypercapnia (HCC)-resolved as of 04/21/2021 Respiratory acidosis noted, previous pCO2 in 30-40's range. Had similar presentation with UTI leading to encephalopathy with respiratory acidosis previously. Baseline COPD and OHS. By 3/9, follow-up ABG noted resolution of respiratory acidosis and hypercarbia.  Patient will need BiPAP nightly  Acute renal failure superimposed on stage 3b chronic kidney disease ([place]) Placed on IV fluids.[name] Despite hx CHF, appears volume depleted on arrival.  By 3/9, volume repleted  Essential hypertension Blood pressure has been trending upward, in part due to fluid resuscitation.  Seizure disorder ([place]) No evidence of acute seizure activity.  Continued on Keppra.  Chronic diastolic heart failure (HCC) BMP within normal limits.  Looked dry  COPD (chronic obstructive pulmonary disease) (HCC) Known centrilobular and paraseptal emphysema for many years, no active wheezing currently.  - Note abnormal CXR, consider HRCT after discharge to evaluate for ILD.  Morbid obesity ([place]) Meets criteria BMI greater than 40         Consultants: Pulmonary Procedures performed: None Disposition: Skilled nursing facility Diet recommendation:  Carb modified diet DISCHARGE MEDICATION: Allergies as of 04/21/2021  Reactions   Bee Venom Swelling   Enalapril Maleate Swelling         Medication List     TAKE these medications    acetaminophen 650 MG CR tablet Commonly known as: TYLENOL Take 650 mg by mouth 3 (three) times daily.   albuterol 0.63 MG/3ML nebulizer solution Commonly known as: ACCUNEB Take 3 mLs by nebulization every 8 (eight) hours as needed for wheezing.   ammonium lactate 12 % cream Commonly known as: AMLACTIN Apply 1 application. topically at bedtime.   Biofreeze 4 % Gel Generic drug: Menthol (Topical Analgesic) Apply 1 application topically in the morning and at bedtime. To both knees   cetirizine 10 MG tablet Commonly known as: ZYRTEC Take 10 mg by mouth daily.   eucerin cream Apply 1 application. topically at bedtime.   Fluticasone-Umeclidin-Vilant 100-62.5-25 MCG/INH Aepb Inhale 1 puff into the lungs daily.   gabapentin 300 MG capsule Commonly known as: NEURONTIN Take 300 mg by mouth at bedtime.   ipratropium-albuterol 0.5-2.5 (3) MG/3ML Soln Commonly known as: DUONEB Take 3 mLs by nebulization 3 (three) times daily.   Lacosamide 100 MG Tabs Take 1 tablet (100 mg total) by mouth 2 (two) times daily.   levETIRAcetam 750 MG tablet Commonly known as: KEPPRA Take 1 tablet (750 mg total) by mouth 2 (two) times daily.   lidocaine 5 % Commonly known as: LIDODERM Place 1 patch onto the skin daily.   LORazepam 2 MG/ML injection Commonly known as: ATIVAN Inject 0.5 mg into the vein daily as needed for seizure.   mupirocin ointment 2 % Commonly known as: BACTROBAN Place into the nose 2 (two) times daily for 3 days.   nitroGLYCERIN 0.4 MG SL tablet Commonly known as: NITROSTAT Place 0.4 mg under the tongue every 5 (five) minutes as needed for chest pain.   pantoprazole 20 MG tablet Commonly known as: PROTONIX Take 20 mg by mouth daily.   propranolol 10 MG tablet Commonly known as: INDERAL Take 10 mg by mouth 2 (two) times daily.   Saline Gel Place 3 sprays into both nostrils 3 (three) times daily.    sertraline 25 MG tablet Commonly known as: ZOLOFT Take 25 mg by mouth daily.        Contact information for after-discharge care     [facility] .   Service: Skilled Nursing Contact information: [place] [phone]                    Discharge Exam: Danley Danker Weights   04/19/21 0538 04/19/21 1900 04/20/21 0500  Weight: 118.1 kg 111.3 kg 111.2 kg   General: Alert and oriented x2, no acute distress Cardiovascular: Regular rate and rhythm, S1-S2 Lungs: Clear to auscultation bilaterally  Condition at discharge: good  The results of significant diagnostics from this hospitalization (including imaging, microbiology, ancillary and laboratory) are listed below for reference.   Imaging Studies: CT Head Wo Contrast  Result Date: 04/19/2021 CLINICAL DATA:  81 year old female with history of altered mental status. EXAM: CT HEAD WITHOUT CONTRAST TECHNIQUE: Contiguous axial images were obtained from the base of the skull through the vertex without intravenous contrast. RADIATION DOSE REDUCTION: This exam was performed according to the departmental dose-optimization program which includes automated exposure control, adjustment of the mA and/or kV according to patient size and/or use of iterative reconstruction technique. COMPARISON:  Head CT 12/28/2019. FINDINGS: Brain: Mild cerebral atrophy. Patchy areas of  mild decreased attenuation are noted throughout the deep and periventricular white matter of the cerebral hemispheres bilaterally, compatible with mild chronic microvascular ischemic disease. No evidence of acute infarction, hemorrhage, hydrocephalus, extra-axial collection or mass lesion/mass effect. Vascular: No hyperdense vessel or unexpected calcification. Skull: Normal. Negative for fracture or focal lesion. Sinuses/Orbits: No acute finding. Chronic right mastoid effusion, unchanged. Other: None.  IMPRESSION: 1. No acute intracranial abnormalities. 2. Mild cerebral atrophy with mild chronic microvascular ischemic changes in the cerebral white matter, as above. Electronically Signed   By: Vinnie Langton M.D.   On: 04/19/2021 06:12   MR BRAIN WO CONTRAST  Result Date: 04/19/2021 CLINICAL DATA:  Mental status change, unknown cause. EXAM: MRI HEAD WITHOUT CONTRAST TECHNIQUE: Multiplanar, multiecho pulse sequences of the brain and surrounding structures were obtained without intravenous contrast. COMPARISON:  Prior head CT examinations 04/19/2021 and earlier. Brain MRI 02/11/2019. FINDINGS: Intermittently motion degraded examination. Most notably, there is moderate motion degradation of the sagittal T1 weighted sequence and mild motion degradation of the axial T1 weighted sequence. Brain: No age advanced or lobar predominant cerebral atrophy. Moderate multifocal T2 FLAIR hyperintense signal abnormality within the cerebral white matter and pons, nonspecific but compatible chronic small vessel disease. As before, chronic small vessel ischemic changes are also present within the right basal ganglia. Partially empty sella turcica. There is no acute infarct. No evidence of an intracranial mass. No chronic intracranial blood products. No extra-axial fluid collection. No midline shift. Vascular: Maintained flow voids within the proximal large arterial vessels. Skull and upper cervical spine: No focal suspicious marrow lesion. Incompletely assessed cervical spondylosis. Sinuses/Orbits: Bilateral proptosis. Small mucous retention cyst within the left sphenoid sinus. Mild mucosal thickening within the bilateral ethmoid sinuses. Other: Right mastoid effusion. Small-volume fluid also present within the left mastoid air cells. IMPRESSION: No evidence of acute intracranial abnormality. Moderate chronic small vessel ischemic changes within the cerebral white matter and pons. Chronic small vessel ischemic changes are also  present within the right basal ganglia. These findings have not significantly changed from the prior brain MRI of 02/11/2019. Bilateral proptosis. Mild paranasal sinus disease, as described. Right mastoid effusion. Small-volume fluid also present within the left mastoid air cells. Electronically Signed   By: Kellie Simmering D.O.   On: 04/19/2021 10:23   DG Chest Port 1 View  Result Date: 04/19/2021 CLINICAL DATA:  81 year old female with history of altered mental status. EXAM: PORTABLE CHEST 1 VIEW COMPARISON:  Chest x-ray 01/12/2021. FINDINGS: Lung volumes are low. Diffuse areas of interstitial prominence and widespread peribronchial cuffing, slightly increased compared to the prior study, particularly in the right mid to lower lung. No confluent consolidative airspace disease. No pleural effusions. No pneumothorax. No evidence of pulmonary edema. Heart size is mildly enlarged. The patient is rotated to the right on today's exam, resulting in distortion of the mediastinal contours and reduced diagnostic sensitivity and specificity for mediastinal pathology. Atherosclerotic calcifications are noted in the thoracic aorta. IMPRESSION: 1. The appearance of the lungs could suggests potential interstitial lung disease. Further evaluation with high-resolution chest CT is recommended in the near future to better evaluate these findings. 2. Aortic atherosclerosis. 3. Mild cardiomegaly. Electronically Signed   By: Vinnie Langton M.D.   On: 04/19/2021 06:15    Microbiology: Results for orders placed or performed during the hospital encounter of 04/19/21  Culture, blood (routine x 2)     Status: None (Preliminary result)   Collection Time: 04/19/21  5:33 AM   Specimen: BLOOD  Result Value Ref Range Status   Specimen Description BLOOD RIGHT [facility]  Final   Special Requests   Final    BOTTLES DRAWN AEROBIC AND ANAEROBIC Blood Culture adequate volume   Culture   Final    NO GROWTH 2 DAYS Performed at [facility], [address] [place], [place] [address]    Report Status PENDING  Incomplete  Culture, blood (routine x 2)     Status: None (Preliminary result)   Collection Time: 04/19/21  5:33 AM   Specimen: BLOOD  Result Value Ref Range Status   Specimen Description BLOOD RIGHT FA  Final   Special Requests   Final    BOTTLES DRAWN AEROBIC AND ANAEROBIC Blood Culture adequate volume   Culture   Final    NO GROWTH 2 DAYS Performed at [facility], [address], [place], [place] [address]    Report Status PENDING  Incomplete  Urine Culture     Status: Abnormal   Collection Time: 04/19/21  5:33 AM   Specimen: In/Out Cath Urine  Result Value Ref Range Status   Specimen Description   Final    IN/OUT CATH URINE Performed at [facility], [address] [place], [place] [address]    Special Requests   Final    NONE Performed at [facility], [address] [place], [place] [address]    Culture >=100,000 COLONIES/mL ESCHERICHIA COLI (A)  Final   Report Status 04/21/2021 FINAL  Final   Organism ID, Bacteria ESCHERICHIA COLI (A)  Final      Susceptibility   Escherichia coli - MIC*    AMPICILLIN <=2 SENSITIVE Sensitive     CEFAZOLIN <=4 SENSITIVE Sensitive     CEFEPIME <=0.12 SENSITIVE Sensitive     CEFTRIAXONE <=0.25 SENSITIVE Sensitive     CIPROFLOXACIN <=0.25 SENSITIVE Sensitive     GENTAMICIN <=1 SENSITIVE Sensitive     IMIPENEM <=0.25 SENSITIVE Sensitive     NITROFURANTOIN <=16 SENSITIVE Sensitive     TRIMETH/SULFA <=20 SENSITIVE Sensitive     AMPICILLIN/SULBACTAM <=2 SENSITIVE Sensitive     PIP/TAZO <=4 SENSITIVE Sensitive     * >=100,000 COLONIES/mL ESCHERICHIA COLI  Resp Panel by RT-PCR (Flu A&B, Covid) Nasopharyngeal Swab     Status: None   Collection Time: 04/19/21  5:33 AM   Specimen: Nasopharyngeal Swab; Nasopharyngeal(NP) swabs in vial transport medium  Result Value Ref Range Status   SARS Coronavirus 2 by RT PCR NEGATIVE NEGATIVE Final     Comment: (NOTE) SARS-CoV-2 target nucleic acids are NOT DETECTED.  The SARS-CoV-2 RNA is generally detectable in upper respiratory specimens during the acute phase of infection. The lowest concentration of SARS-CoV-2 viral copies this assay can detect is 138 copies/mL. A negative result does not preclude SARS-Cov-2 infection and should not be used as the sole basis for treatment or other patient management decisions. A negative result may occur with  improper specimen collection/handling, submission of specimen other than nasopharyngeal swab, presence of viral mutation(s) within the areas targeted by this assay, and inadequate number of viral copies(<138 copies/mL). A negative result must be combined with clinical observations, patient history, and epidemiological information. The expected result is Negative.  Fact Sheet for Patients:  EntrepreneurPulse.com.au  Fact Sheet for Healthcare Providers:  IncredibleEmployment.be  This test is no t yet approved or cleared by the [place] FDA and  has been authorized for detection and/or diagnosis of SARS-CoV-2 by FDA under an Emergency Use Authorization (EUA). This EUA  will remain  in effect (meaning this test can be used) for the duration of the COVID-19 declaration under Section 564(b)(1) of the Act, 21 U.S.C.section 360bbb-3(b)(1), unless the authorization is terminated  or revoked sooner.       Influenza A by PCR NEGATIVE NEGATIVE Final   Influenza B by PCR NEGATIVE NEGATIVE Final    Comment: (NOTE) The Xpert Xpress SARS-CoV-2/FLU/RSV plus assay is intended as an aid in the diagnosis of influenza from Nasopharyngeal swab specimens and should not be used as a sole basis for treatment. Nasal washings and aspirates are unacceptable for Xpert Xpress SARS-CoV-2/FLU/RSV testing.  Fact Sheet for Patients: EntrepreneurPulse.com.au  Fact Sheet for Healthcare  Providers: IncredibleEmployment.be  This test is not yet approved or cleared by the [place] FDA and has been authorized for detection and/or diagnosis of SARS-CoV-2 by FDA under an Emergency Use Authorization (EUA). This EUA will remain in effect (meaning this test can be used) for the duration of the COVID-19 declaration under Section 564(b)(1) of the Act, 21 U.S.C. section 360bbb-3(b)(1), unless the authorization is terminated or revoked.  Performed at [facility], [address] [place], [place] [address]   MRSA Next Gen by PCR, Nasal     Status: Abnormal   Collection Time: 04/19/21  7:05 PM   Specimen: Nasal Mucosa; Nasal Swab  Result Value Ref Range Status   MRSA by PCR Next Gen DETECTED (A) NOT DETECTED Final    Comment: RESULT CALLED TO, READ BACK BY AND VERIFIED WITH: Hansel Starling @2031  on 04/19/21 skl/ss (NOTE) The GeneXpert MRSA Assay (FDA approved for NASAL specimens only), is one component of a comprehensive MRSA colonization surveillance program. It is not intended to diagnose MRSA infection nor to guide or monitor treatment for MRSA infections. Test performance is not FDA approved in patients less than 59 years old. Performed at [facility], [address] [place], [place] [address]     Labs: CBC: Recent Labs  Lab 04/19/21 0533 04/20/21 0421  WBC 5.2 4.1  NEUTROABS 2.4  --   HGB 12.3 11.0*  HCT 40.7 36.1  MCV 99.3 97.3  PLT 165 876*   Basic Metabolic Panel: Recent Labs  Lab 04/19/21 0533 04/20/21 0421  NA 144 144  K 5.0 4.5  CL 106 107  CO2 32 32  GLUCOSE 105* 86  BUN 35* 30*  CREATININE 2.20* 1.74*  CALCIUM 9.4 9.2   Liver Function Tests: Recent Labs  Lab 04/19/21 0533  AST 15  ALT 11  ALKPHOS 96  BILITOT 0.3  PROT 7.2  ALBUMIN 3.2*   CBG: Recent Labs  Lab 04/20/21 1120 04/20/21 1645 04/20/21 2130 04/21/21 0849 04/21/21 1152  GLUCAP 88 108* 86 105* 96    Discharge time spent:  less than 30 minutes.  Signed: Annita Brod, MD [facility] 04/21/2021

## 2021-04-21 NOTE — Progress Notes (Signed)
Patient is transfer from ICU. Placed patient on bipap once settled into room. On bipap for appx 10 min. RN called to say patient C/O not being able to tolerate bipap. Patient back on nasal cannula. Will message provider to inform that patient is off bipap.  ?

## 2021-04-21 NOTE — Plan of Care (Signed)
  Problem: Nutrition: Goal: Adequate nutrition will be maintained Outcome: Progressing   Problem: Coping: Goal: Level of anxiety will decrease Outcome: Progressing   Problem: Skin Integrity: Goal: Risk for impaired skin integrity will decrease Outcome: Progressing   

## 2021-04-21 NOTE — Progress Notes (Signed)
Patient was placed on Bipap by respiratory and shortly after began stating "I can't breath" O2 sats were above 90%. Nurse called respiratory therapist. Patient was taken off Bipap and placed back on 2LPM nasal cannula. ?

## 2021-04-21 NOTE — Progress Notes (Deleted)
Lab called with critical labs: Na+ of 167 and Chloride >130; Care RN, Lorrin Jackson notified of results; acknowledged. Barbaraann Faster, RN 5:55 AM 04/21/2021 ? ?

## 2021-04-24 LAB — CULTURE, BLOOD (ROUTINE X 2)
Culture: NO GROWTH
Culture: NO GROWTH
Special Requests: ADEQUATE
Special Requests: ADEQUATE

## 2021-04-26 DIAGNOSIS — R569 Unspecified convulsions: Secondary | ICD-10-CM | POA: Diagnosis not present

## 2021-04-26 DIAGNOSIS — I10 Essential (primary) hypertension: Secondary | ICD-10-CM | POA: Diagnosis not present

## 2021-04-26 DIAGNOSIS — J449 Chronic obstructive pulmonary disease, unspecified: Secondary | ICD-10-CM | POA: Diagnosis not present

## 2021-04-26 DIAGNOSIS — M1990 Unspecified osteoarthritis, unspecified site: Secondary | ICD-10-CM | POA: Diagnosis not present

## 2021-04-26 DIAGNOSIS — J3489 Other specified disorders of nose and nasal sinuses: Secondary | ICD-10-CM | POA: Diagnosis not present

## 2021-04-26 DIAGNOSIS — K219 Gastro-esophageal reflux disease without esophagitis: Secondary | ICD-10-CM | POA: Diagnosis not present

## 2021-04-26 DIAGNOSIS — J302 Other seasonal allergic rhinitis: Secondary | ICD-10-CM | POA: Diagnosis not present

## 2021-04-26 DIAGNOSIS — G629 Polyneuropathy, unspecified: Secondary | ICD-10-CM | POA: Diagnosis not present

## 2021-04-26 DIAGNOSIS — F329 Major depressive disorder, single episode, unspecified: Secondary | ICD-10-CM | POA: Diagnosis not present

## 2021-04-27 DIAGNOSIS — E1159 Type 2 diabetes mellitus with other circulatory complications: Secondary | ICD-10-CM | POA: Diagnosis not present

## 2021-05-02 ENCOUNTER — Encounter: Payer: Self-pay | Admitting: Nurse Practitioner

## 2021-05-02 ENCOUNTER — Other Ambulatory Visit: Payer: Self-pay

## 2021-05-02 ENCOUNTER — Non-Acute Institutional Stay: Payer: Medicare HMO | Admitting: Nurse Practitioner

## 2021-05-02 VITALS — BP 151/71 | HR 82 | Temp 97.7°F | Resp 18 | Wt 241.2 lb

## 2021-05-02 DIAGNOSIS — R0602 Shortness of breath: Secondary | ICD-10-CM

## 2021-05-02 DIAGNOSIS — Z515 Encounter for palliative care: Secondary | ICD-10-CM

## 2021-05-02 DIAGNOSIS — R5381 Other malaise: Secondary | ICD-10-CM

## 2021-05-02 DIAGNOSIS — I509 Heart failure, unspecified: Secondary | ICD-10-CM

## 2021-05-02 NOTE — Progress Notes (Addendum)
? ? ?Manufacturing engineer ?Community Palliative Care Consult Note ?Telephone: [phone]  ?Fax: [phone]  ? ?Date of encounter: 05/02/21 ?1:31 PM ?PATIENT NAME: Katherine [name] ?[address] ?[place] [address]   ?[phone] (home)  ?DOB: Jan 08, 1941 ?MRN: [id number] ?PRIMARY CARE PROVIDER:    ?[facility], [facility],  ?[address] ?[place] [address] ?[phone] ?RESPONSIBLE PARTY:    ?Contact Information   ? ? Name Relation Home Work Mobile  ? Bennie Hind Spouse [phone]  [phone]  ? Cheresa, Siers Daughter   [phone]  ? ?  ? ?I met face to face with patient in facility. Palliative Care was asked to follow this patient by consultation request of  [facility], [facility] to address advance care planning and complex medical decision making. This is the initial visit.  ?                 ?ASSESSMENT AND PLAN / RECOMMENDATIONS:  ?Symptom Management/Plan: ?1. ACP: remains a full code; most form in ACP ?  ?2. Edema stable; secondary to CHF, monitor weights, elevate; discussed nutrition with Mr. Katherine [name] ?  ?3. Shortness of breath secondary to CHF, controlled;  continue diuresing, inhalation therapy; Continuous O2,  Continue daily weights; discussed with Mr. Katherine [name] pending appointment with Dr [facility] ?  ?10/23/2020 weight 249.1 lbs ?12/24/2020 weight 245.1 lbs ?01/16/2021 weight 239.0 lbs ? 04/30/2021 weight 241.2 lbs ?4. Debility; discussed with Katherine [name] about getting oob which she declines. Revisited with Mr. Katherine [name] updated Katherine [name] getting out of bed, explained non-compliance, if she declines to staff when they try to get her oob.  ?  ?5. Palliative care encounter; Palliative medicine team will continue to support patient, patient's family, and medical team. Visit consisted of counseling and education dealing with the complex and emotionally intense issues of symptom management and palliative care in the setting of serious and potentially life-threatening illness ? ?Initial  Palliative Care Visit: Palliative care will continue to follow for complex medical decision making, advance care planning, and clarification of goals. Return 4 weeks or prn. ? ?I spent 48 minutes providing this consultation starting at 1:30pm. More than 50% of the time in this consultation was spent in counseling and care coordination ? ?PPS: 30% ? ?Chief Complaint: Initial palliative consult for complex medical decision making ? ?HISTORY OF PRESENT ILLNESS:  Katherine [name] is a 81 y.o. 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 Hospitalized 04/19/2021 for acute on chronic respiratory failure secondary to hypoxia, hypercapnia, acute metabolic encephalopathy, UTI, AKI on CKD 3b, CHF requiring bipap/abx stabilized and discharged back to [facility]. Katherine [name] remains bed bound, require staff to assist her with turning, positioning, bathing, dressing. Katherine [name] does feed herself after tray setup. Katherine. [name] has been feeding herself per staff. Appetite has been good per staff. Staff endorses no other changes or concerns. I visited and observed Katherine. [name] as she was sitting up in bed, feeding herself lunch. No visitors present. We talked about purpose of pc visit though limited with cognitive impairment. Katherine [name] was able to answer simple questions  symptoms, ros. We talked about appetite, foods she likes which is not  currently what she is eating, shepards pie. We talked about healthy choices. We talked about debility, getting oob which she declined. We talked about residing at facility though limited. Most PC visit supportive. Emotional support provided. Medical goals reviewed. I attempted to contact Mr. Katherine [name], no answer, updated staff, no changes to goc.  ? ?History obtained from review of EMR, discussion with facility staff and  Katherine. [name].  ?I reviewed available labs, medications, imaging, studies and related documents from the EMR.  Records reviewed and summarized above.  ? ?ROS ?10 point system reviewed with staff and Katherine [name] all negative except HPI ? ?Physical Exam: ?Constitutional: NAD ?General: obese, debilitated pleasant female ?EYES:  lids intact ?ENMT: oral mucous membranes moist ?CV: S1S2, RRR ?Pulmonary: decrease bases, no increased work of breathing, no cough, O2 ?Abdomen: normo-active BS + 4 quadrants, soft and non tender ?MSK: bedbound; functionally quadriplegic ?Skin: warm and dry ?Neuro:  + generalized weakness,  + cognitive impairment ?Psych: non-anxious affect, A and O x 2 ?CURRENT PROBLEM LIST:  ?Patient Active Problem List  ? Diagnosis Date Noted  ? Acute respiratory failure with hypoxia and hypercapnia ([place]) 04/19/2021  ? Type 2 diabetes mellitus with stage 4 chronic kidney disease ([place]) 12/28/2019  ? Sepsis due to gram-negative UTI ([place]) 06/19/2019  ? Type II diabetes mellitus with renal manifestations ([place]) 06/19/2019  ? Stroke ([place]) 06/19/2019  ? AAA (abdominal aortic aneurysm) 06/19/2019  ? Iron deficiency anemia   ? Altered mental status   ? Acute encephalopathy 02/11/2019  ? Sepsis secondary to UTI ([place]) 01/08/2019  ? Acute renal failure superimposed on stage 3b chronic kidney disease ([place]) 01/08/2019  ? Seizure disorder ([place]) 01/08/2019  ? Diabetes mellitus type 2, controlled, with complications ([place]) 38/11/1749  ? Acute on chronic anemia 01/03/2019  ? COVID-19 12/21/2018  ? COVID-19 virus  infection 12/21/2018  ? Hemoptysis 12/20/2018  ? Pulmonary embolism ([place]) 11/21/2018  ? Delirium   ? UTI (urinary tract infection) 08/21/2018  ? Chest pain 07/15/2018  ? Palliative care encounter 05/10/2018  ? Localized edema 05/10/2018  ? Shortness of breath 05/10/2018  ? Hematemesis 04/28/2018  ? Influenza A 04/13/2018  ? Acute on chronic congestive heart failure ([place])   ? COPD with acute exacerbation ([place]) 02/24/2018  ? Chronic diastolic heart failure ([place]) 12/31/2017  ? Lymphedema 12/31/2017  ? COPD exacerbation ([place]) 11/30/2017  ? Urinary tract infection 11/22/2017  ? 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[place]) 11/16/2017  ? Diabetes mellitus type 2, uncomplicated ([place]) 02/58/5277  ? Pressure injury of skin 10/29/2017  ? Acute kidney injury superimposed on CKD ([place]) 10/24/2017  ? Sepsis ([place]) 10/24/2017  ? Possible Seizures ([place]) 10/08/2017  ? Hyperlipemia 10/08/2017  ? 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 Stroke-like episode ([place]) s/p IV tpa 10/04/2017  ? Palliative care by specialist   ? Elevated rheumatoid factor 09/05/2017  ? Frequent hospital admissions 08/22/2017  ? Aphasia 06/28/2017  ? Right hand pain 03/21/2017  ? Dry skin 03/21/2017  ? Pain in finger of left hand 09/12/2016  ? Chronic fatigue 06/12/2016  ? Left knee pain 06/12/2016  ? Goals of care, counseling/discussion 03/13/2016  ? Primary localized osteoarthritis of right knee 02/08/2016  ? OSA (obstructive sleep apnea) 09/16/2015  ? Rotator cuff syndrome 09/07/2015  ? Pulmonary scarring 07/27/2015  ? Sleep disturbance 04/14/2015  ? Coronary artery disease 03/14/2015  ? Polyp of vocal cord 03/14/2015  ? Hypertensive  heart disease without congestive heart failure 09/29/2014  ? Acute respiratory failure with hypoxia ([place]) 09/16/2014  ? 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 Anxiety   ? Tobacco abuse   ? Prurigo nodularis   ? GERD (gastroesophageal reflux disease)   ? COPD (chronic obstructive pulmonary disease) ([place])   ?  Osteoarthritis   ? Vitamin D deficiency disease   ? Chronic constipation   ? CKD (chronic kidney disease), stage III ([place])   ? Essential hypertension 05/20/2013  ? Morbid obesity ([place]) 05/20/2013  ? Pain in soft tissues

## 2021-05-02 NOTE — Addendum Note (Signed)
Addended by: Shawn Stall on: 05/02/2021 05:25 PM ? ? Modules accepted: Level of Service ? ?

## 2021-05-03 DIAGNOSIS — I10 Essential (primary) hypertension: Secondary | ICD-10-CM | POA: Diagnosis not present

## 2021-05-03 DIAGNOSIS — R7309 Other abnormal glucose: Secondary | ICD-10-CM | POA: Diagnosis not present

## 2021-05-11 DIAGNOSIS — R059 Cough, unspecified: Secondary | ICD-10-CM | POA: Diagnosis not present

## 2021-05-11 DIAGNOSIS — G4733 Obstructive sleep apnea (adult) (pediatric): Secondary | ICD-10-CM | POA: Diagnosis not present

## 2021-05-17 ENCOUNTER — Encounter (HOSPITAL_COMMUNITY): Payer: Self-pay | Admitting: Radiology

## 2021-05-19 DIAGNOSIS — G40909 Epilepsy, unspecified, not intractable, without status epilepticus: Secondary | ICD-10-CM | POA: Diagnosis not present

## 2021-05-19 DIAGNOSIS — J398 Other specified diseases of upper respiratory tract: Secondary | ICD-10-CM | POA: Diagnosis not present

## 2021-05-20 DIAGNOSIS — I10 Essential (primary) hypertension: Secondary | ICD-10-CM | POA: Diagnosis not present

## 2021-05-26 DIAGNOSIS — E119 Type 2 diabetes mellitus without complications: Secondary | ICD-10-CM | POA: Diagnosis not present

## 2021-05-26 DIAGNOSIS — I10 Essential (primary) hypertension: Secondary | ICD-10-CM | POA: Diagnosis not present

## 2021-05-26 DIAGNOSIS — K219 Gastro-esophageal reflux disease without esophagitis: Secondary | ICD-10-CM | POA: Diagnosis not present

## 2021-05-26 DIAGNOSIS — E785 Hyperlipidemia, unspecified: Secondary | ICD-10-CM | POA: Diagnosis not present

## 2021-05-26 DIAGNOSIS — M1990 Unspecified osteoarthritis, unspecified site: Secondary | ICD-10-CM | POA: Diagnosis not present

## 2021-05-26 DIAGNOSIS — G629 Polyneuropathy, unspecified: Secondary | ICD-10-CM | POA: Diagnosis not present

## 2021-05-26 DIAGNOSIS — J449 Chronic obstructive pulmonary disease, unspecified: Secondary | ICD-10-CM | POA: Diagnosis not present

## 2021-05-26 DIAGNOSIS — I5032 Chronic diastolic (congestive) heart failure: Secondary | ICD-10-CM | POA: Diagnosis not present

## 2021-05-26 DIAGNOSIS — F329 Major depressive disorder, single episode, unspecified: Secondary | ICD-10-CM | POA: Diagnosis not present

## 2021-06-02 IMAGING — CT CT ANGIO CHEST
2 of 6 series · 17 of 46 positions shown · IV contrast (APPLIED)
Comparison: Portable chest obtained earlier today. Chest CT dated
07/15/2018. Abdomen and pelvis CT dated 05/04/2017.

CLINICAL DATA: Chest pain, shortness of breath and diffuse, acute
abdominal pain.

EXAM:
CT ANGIOGRAPHY CHEST
CT ABDOMEN AND PELVIS WITH CONTRAST
TECHNIQUE: Multidetector CT imaging of the chest was performed using the
standard protocol during bolus administration of intravenous
contrast. Multiplanar CT image reconstructions and MIPs were
obtained to evaluate the vascular anatomy. Multidetector CT imaging
of the abdomen and pelvis was performed using the standard protocol
during bolus administration of intravenous contrast.
CONTRAST:  60mL OMNIPAQUE IOHEXOL 350 MG/ML SOLN

[Series 3: thins · axial · 0.68mm/px · z∈[-288,-42]mm · 14 of 270 slices shown]
[im 12/270  lung]
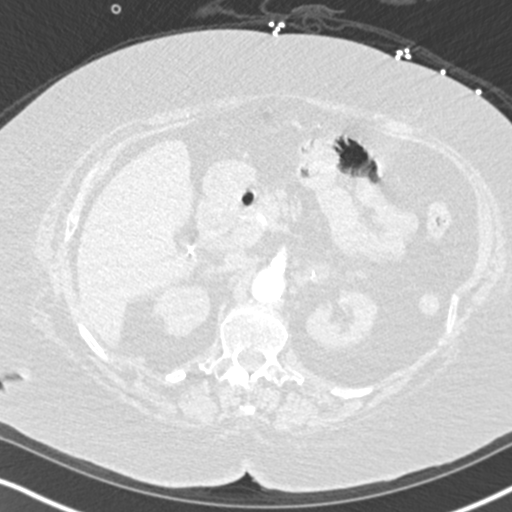
[im 36/270  soft-tissue]
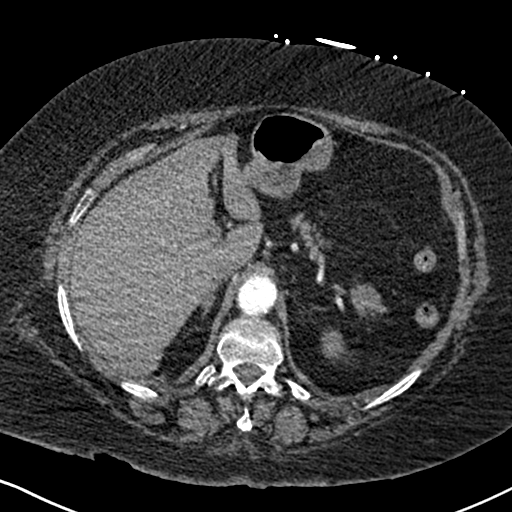
[im 47/270  lung]
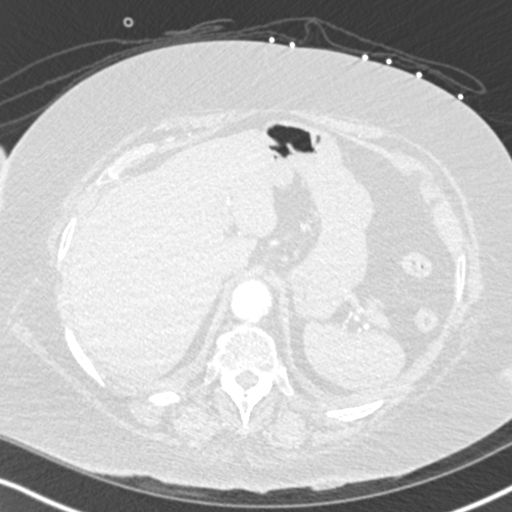
[im 71/270  soft-tissue]
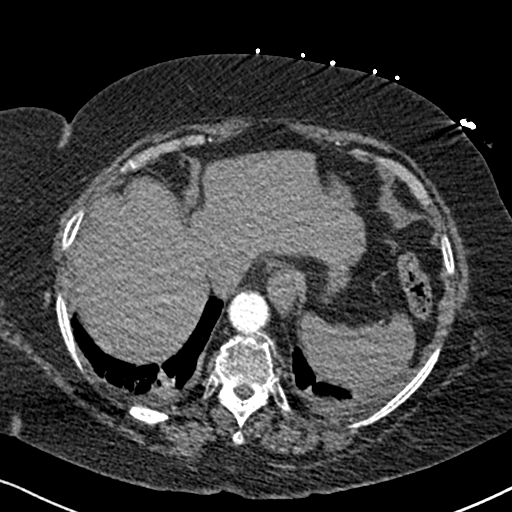
[im 94/270  lung]
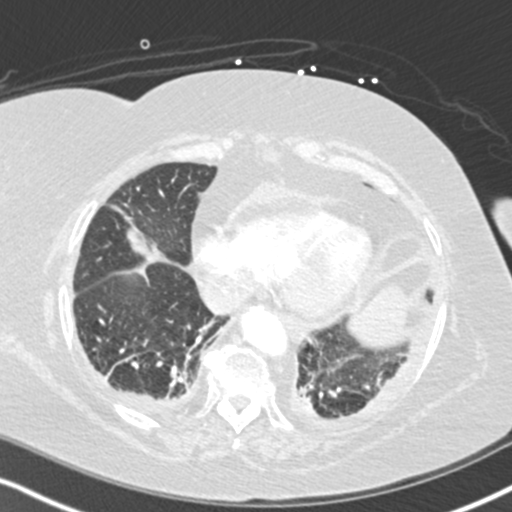
[im 106/270  soft-tissue]
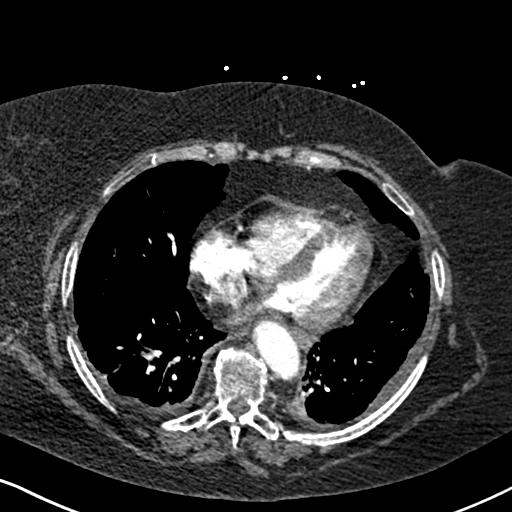
[im 129/270  lung]
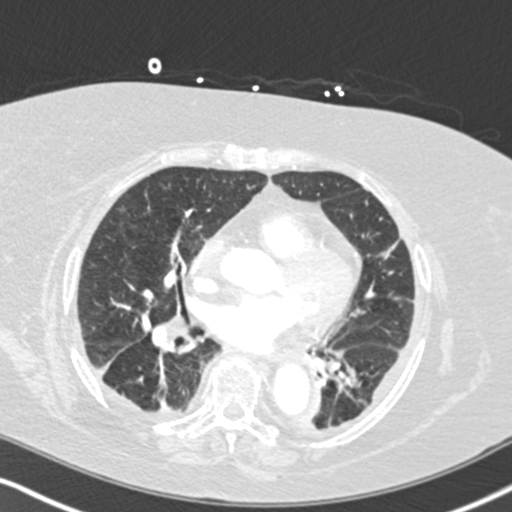
[im 141/270  soft-tissue]
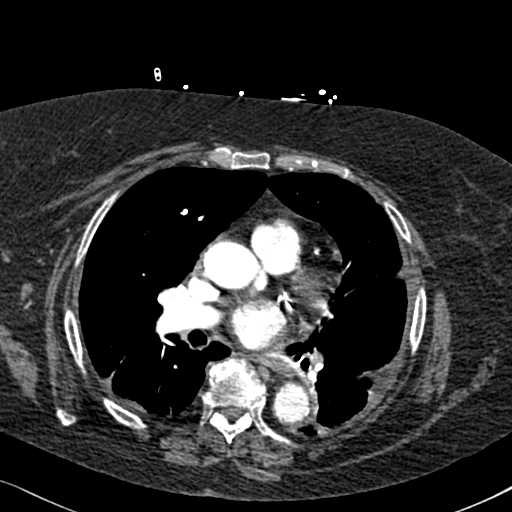
[im 164/270  lung]
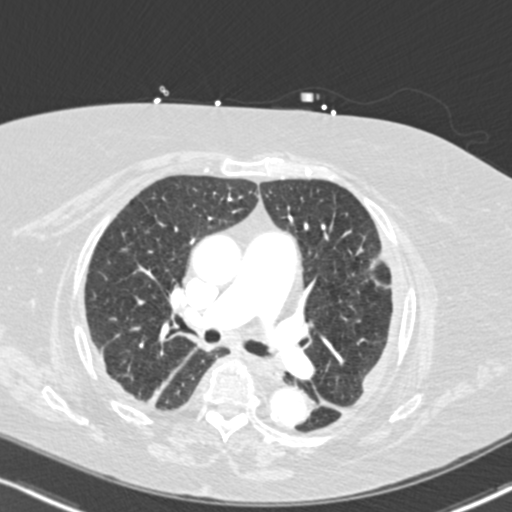
[im 176/270  soft-tissue]
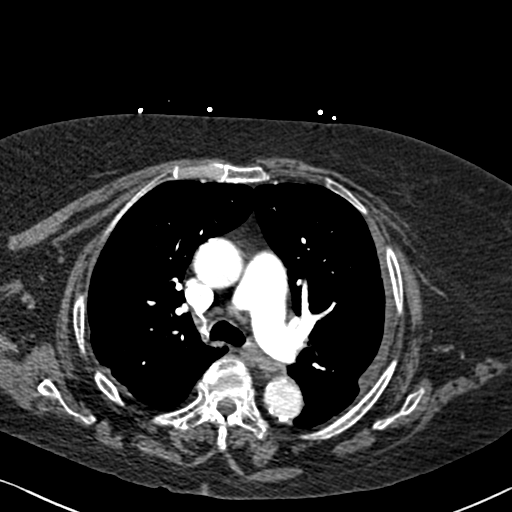
[im 199/270  lung]
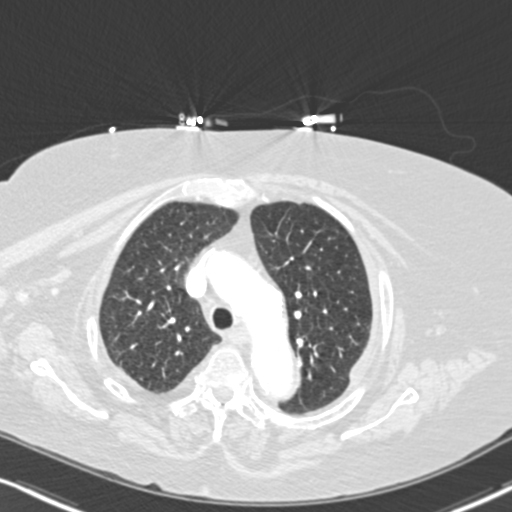
[im 223/270  soft-tissue]
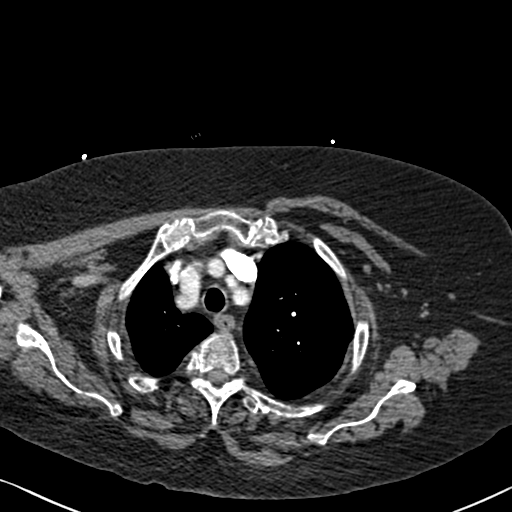
[im 234/270  lung]
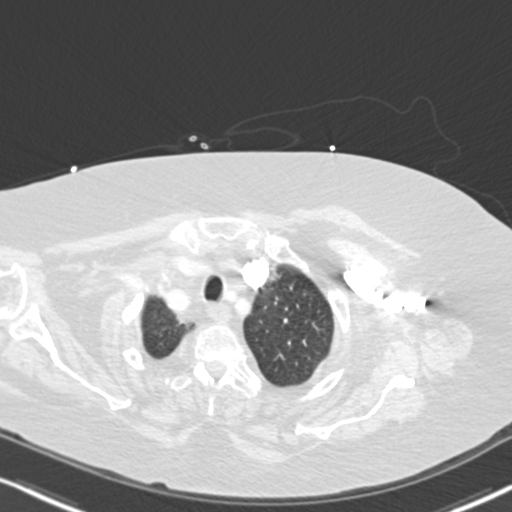
[im 258/270  soft-tissue]
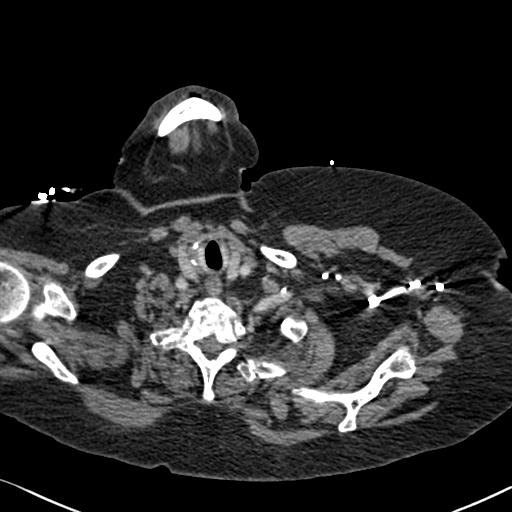

[Series 5: coronal mpr · coronal · 0.65mm/px · 3 of 129 slices shown]
[im 33/129  soft-tissue]
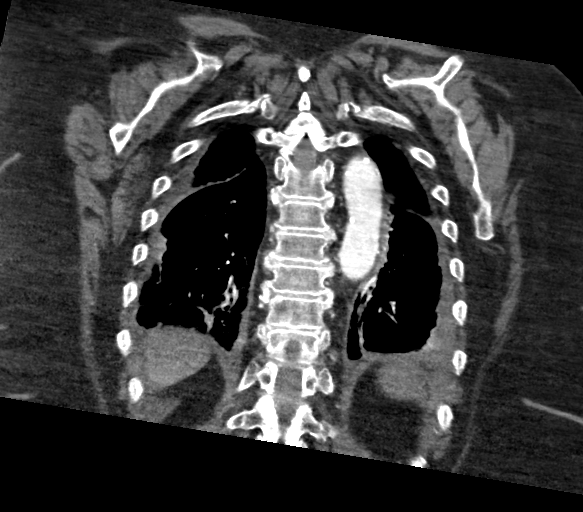
[im 65/129  soft-tissue]
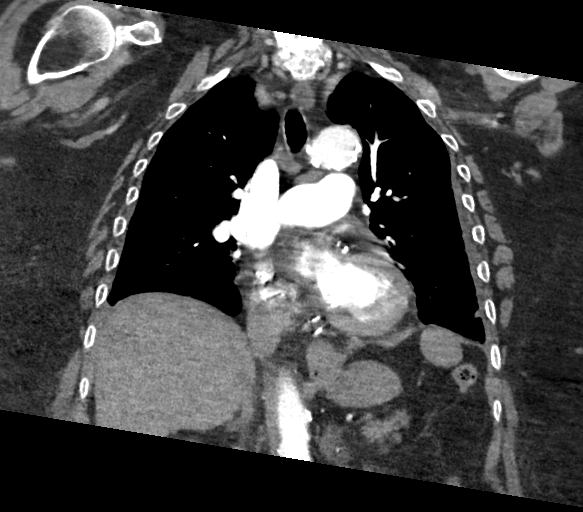
[im 97/129  soft-tissue]
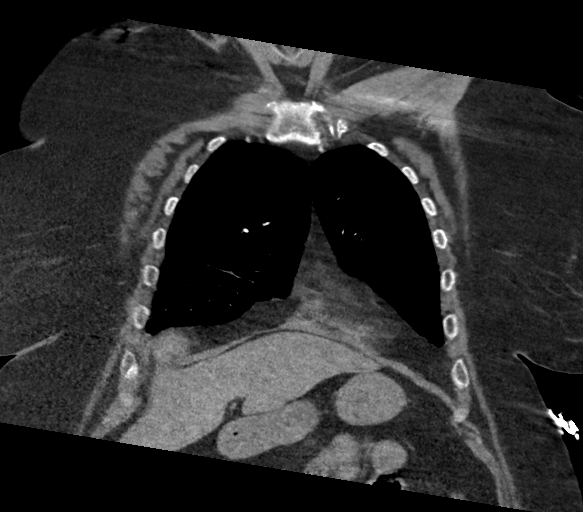

[17 of 46 positions shown; findings below may reference images not displayed]

FINDINGS: CTA CHEST FINDINGS

Cardiovascular: Enlarged central pulmonary arteries. The main
pulmonary artery measures 3.6 cm in diameter.

The pulmonary arteries are normally opacified with a very small
filling defect noted in a left lower lobe pulmonary artery branch on
image number 136 series [DATE] series 7 and 112 series 8. There is a
smaller filling defect more inferiorly in a branch of that pulmonary
artery on image number 151 series 3. No other pulmonary arterial
filling defects are seen. No evidence of right ventricular strain.

Minimal pericardial effusion with a maximum thickness of 15 mm
anteriorly and inferiorly. Dense atheromatous calcifications,
including extensive coronary artery calcifications.

Mediastinum/Nodes: No enlarged mediastinal, hilar, or axillary lymph
nodes. Thyroid gland, trachea, and esophagus demonstrate no
significant findings.

Lungs/Pleura: Mild bibasilar subsegmental atelectasis. Minimal
bilateral pleural fluid.

Musculoskeletal: Thoracic and lower cervical spine degenerative
changes.

Review of the MIP images confirms the above findings.

CT ABDOMEN and PELVIS FINDINGS

Hepatobiliary: No focal liver abnormality is seen. Status post
cholecystectomy. No biliary dilatation.

Pancreas: Unremarkable. No pancreatic ductal dilatation or
surrounding inflammatory changes.

Spleen: Normal in size without focal abnormality.

Adrenals/Urinary Tract: The previously demonstrated 1.8 cm left
adrenal mass containing a coarse curvilinear calcification currently
measures 1.6 x 1.3 cm on image number 23 series 4. Stable normal
appearing right adrenal gland period. Stable small left renal cyst.
The left kidney remains somewhat small. Both kidneys have small
fetal lobulations. Tiny calculus in the inferior right kidney. There
are also 2 tiny right renal cysts. Unremarkable ureters and urinary
bladder.

Stomach/Bowel: Stomach is within normal limits. Appendix appears
normal. No evidence of bowel wall thickening, distention, or
inflammatory changes.

Vascular/Lymphatic: Extensive atheromatous arterial calcifications.
The previously demonstrated 4.3 cm infrarenal abdominal aortic
aneurysm currently measures 4.5 cm in maximum diameter on image
number 39 series 4. This is not involving the iliac arteries.

Reproductive: Uterus and bilateral adnexa are unremarkable.

Other: None.

Musculoskeletal: Lower thoracic spine degenerative changes and mild
lumbar spine degenerative changes.

Review of the MIP images confirms the above findings.
IMPRESSION: 1. Two small left lower lobe pulmonary emboli.
2. Mild bibasilar subsegmental atelectasis.
3. Minimal bilateral pleural fluid.
4. Minimal pericardial effusion.
5. Extensive calcified coronary artery atherosclerosis and aortic
atherosclerosis.
6. Interval mild increase in size of the previously demonstrated
cm infrarenal abdominal aortic aneurysm, currently measuring 4.5 cm
in maximum diameter.
7. Tiny, nonobstructing right renal calculus.

Critical Value/emergent results were called by telephone at the time
of interpretation on 11/21/2018 at [DATE] to Ferienhaus
SONATA , who verbally acknowledged these results.

## 2021-06-02 IMAGING — CT CT ABD-PELV W/ CM
2 of 5 series · 14 of 46 positions shown, 16 images · IV contrast (APPLIED)
Comparison: Portable chest obtained earlier today. Chest CT dated
07/15/2018. Abdomen and pelvis CT dated 05/04/2017.

CLINICAL DATA: Chest pain, shortness of breath and diffuse, acute
abdominal pain.

EXAM:
CT ANGIOGRAPHY CHEST
CT ABDOMEN AND PELVIS WITH CONTRAST
TECHNIQUE: Multidetector CT imaging of the chest was performed using the
standard protocol during bolus administration of intravenous
contrast. Multiplanar CT image reconstructions and MIPs were
obtained to evaluate the vascular anatomy. Multidetector CT imaging
of the abdomen and pelvis was performed using the standard protocol
during bolus administration of intravenous contrast.
CONTRAST:  60mL OMNIPAQUE IOHEXOL 350 MG/ML SOLN

[Series 4: axial st · axial · 0.75mm/px · z∈[-584,-209]mm · 11 of 85 slices shown, 13 images]
[im 5/85  soft-tissue]
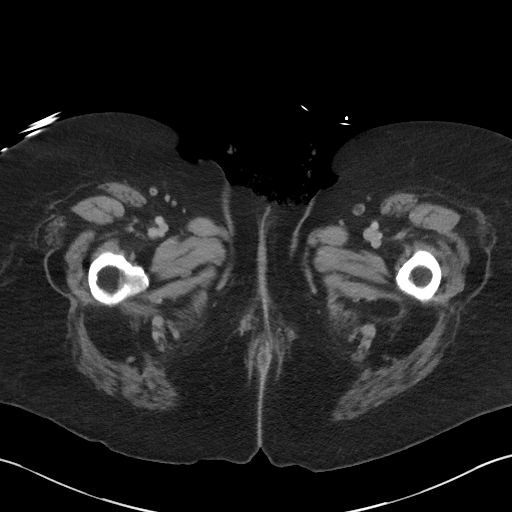
[im 5/85  bone]
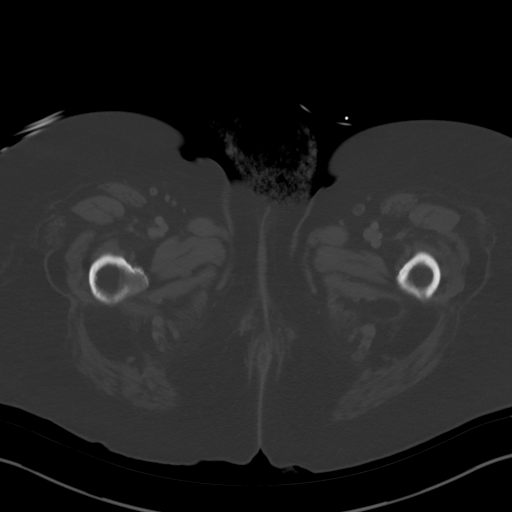
[im 15/85  soft-tissue]
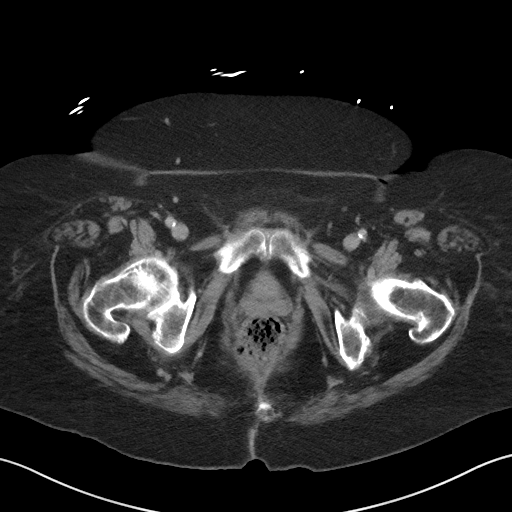
[im 20/85  soft-tissue]
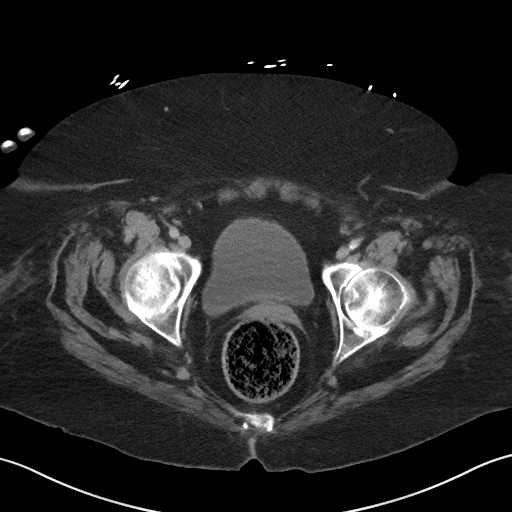
[im 30/85  soft-tissue]
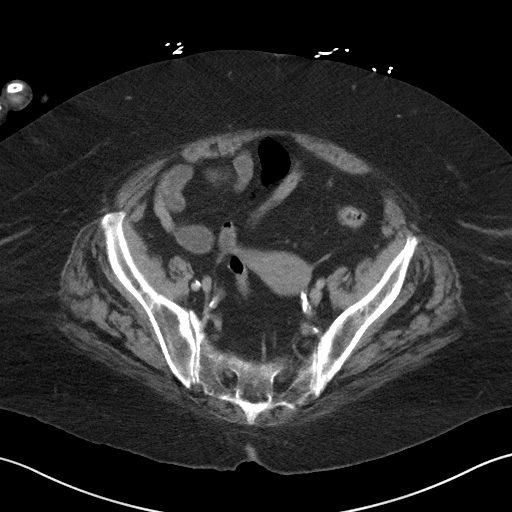
[im 35/85  soft-tissue]
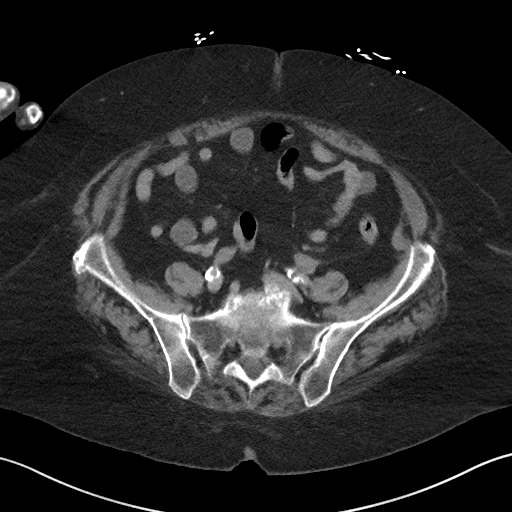
[im 45/85  soft-tissue]
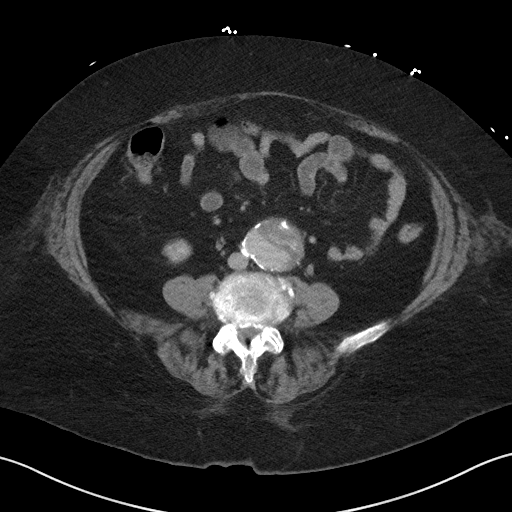
[im 50/85  soft-tissue]
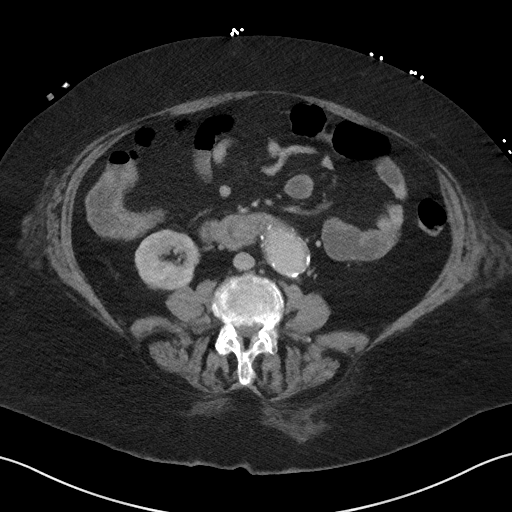
[im 55/85  soft-tissue]
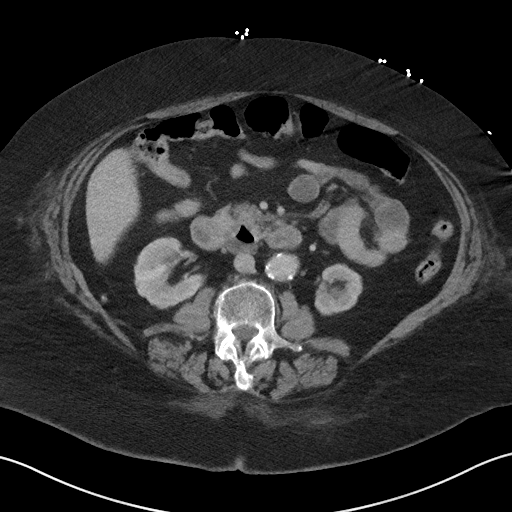
[im 65/85  soft-tissue]
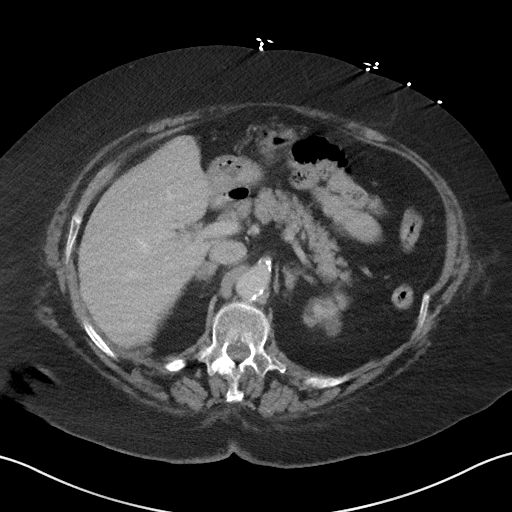
[im 65/85  bone]
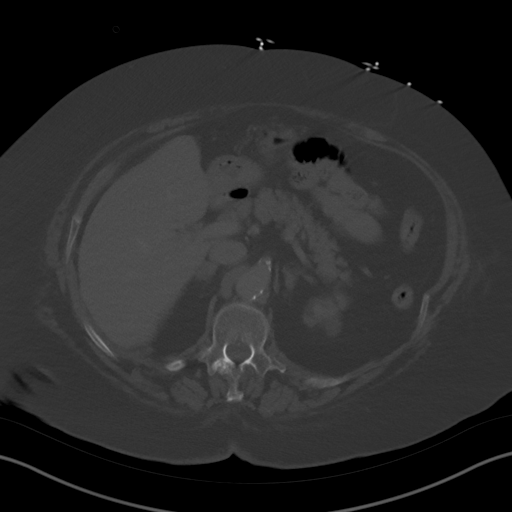
[im 70/85  soft-tissue]
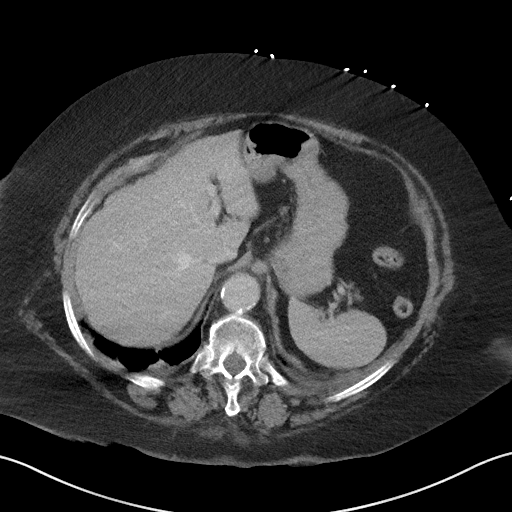
[im 80/85  soft-tissue]
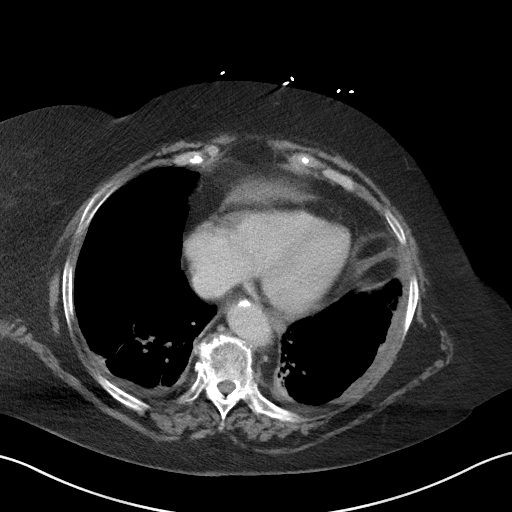

[Series 7: coronal st · coronal · 0.78mm/px · 3 of 94 slices shown]
[im 32/94  soft-tissue]
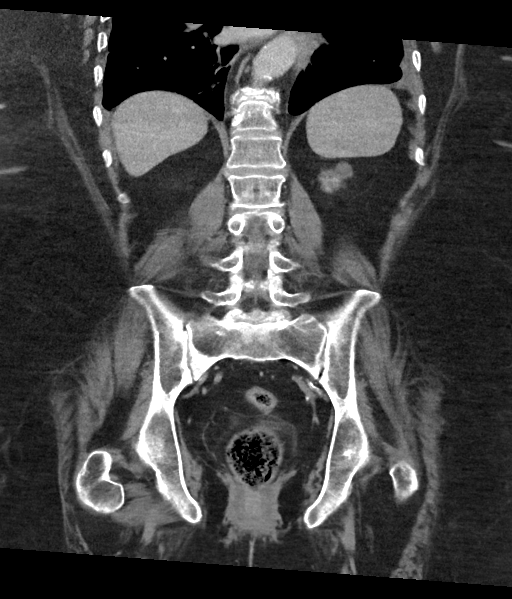
[im 42/94  soft-tissue]
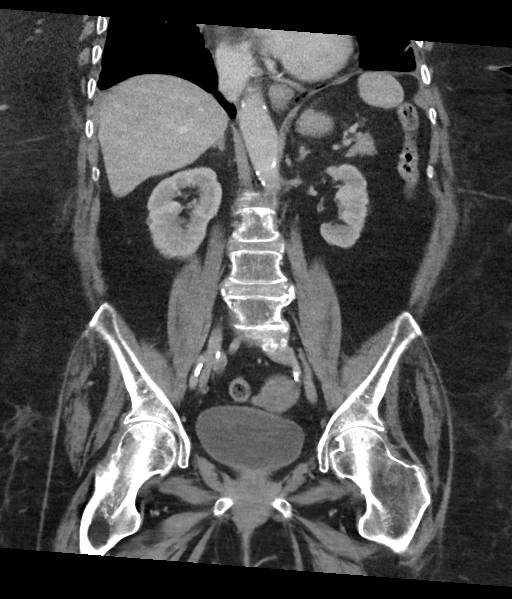
[im 52/94  soft-tissue]
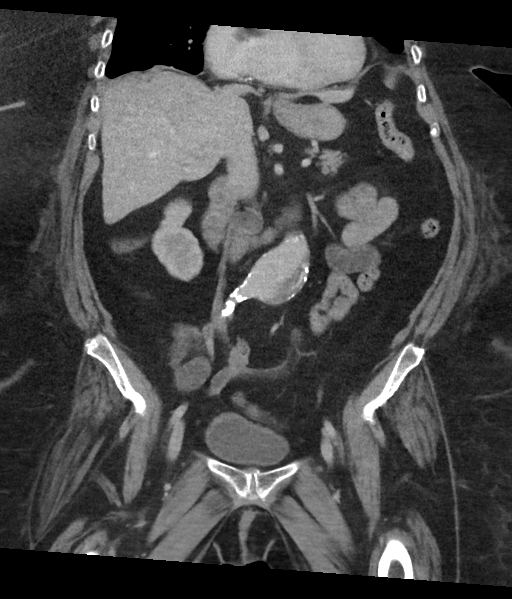

[14 of 46 positions shown; findings below may reference images not displayed]

FINDINGS: CTA CHEST FINDINGS

Cardiovascular: Enlarged central pulmonary arteries. The main
pulmonary artery measures 3.6 cm in diameter.

The pulmonary arteries are normally opacified with a very small
filling defect noted in a left lower lobe pulmonary artery branch on
image number 136 series [DATE] series 7 and 112 series 8. There is a
smaller filling defect more inferiorly in a branch of that pulmonary
artery on image number 151 series 3. No other pulmonary arterial
filling defects are seen. No evidence of right ventricular strain.

Minimal pericardial effusion with a maximum thickness of 15 mm
anteriorly and inferiorly. Dense atheromatous calcifications,
including extensive coronary artery calcifications.

Mediastinum/Nodes: No enlarged mediastinal, hilar, or axillary lymph
nodes. Thyroid gland, trachea, and esophagus demonstrate no
significant findings.

Lungs/Pleura: Mild bibasilar subsegmental atelectasis. Minimal
bilateral pleural fluid.

Musculoskeletal: Thoracic and lower cervical spine degenerative
changes.

Review of the MIP images confirms the above findings.

CT ABDOMEN and PELVIS FINDINGS

Hepatobiliary: No focal liver abnormality is seen. Status post
cholecystectomy. No biliary dilatation.

Pancreas: Unremarkable. No pancreatic ductal dilatation or
surrounding inflammatory changes.

Spleen: Normal in size without focal abnormality.

Adrenals/Urinary Tract: The previously demonstrated 1.8 cm left
adrenal mass containing a coarse curvilinear calcification currently
measures 1.6 x 1.3 cm on image number 23 series 4. Stable normal
appearing right adrenal gland period. Stable small left renal cyst.
The left kidney remains somewhat small. Both kidneys have small
fetal lobulations. Tiny calculus in the inferior right kidney. There
are also 2 tiny right renal cysts. Unremarkable ureters and urinary
bladder.

Stomach/Bowel: Stomach is within normal limits. Appendix appears
normal. No evidence of bowel wall thickening, distention, or
inflammatory changes.

Vascular/Lymphatic: Extensive atheromatous arterial calcifications.
The previously demonstrated 4.3 cm infrarenal abdominal aortic
aneurysm currently measures 4.5 cm in maximum diameter on image
number 39 series 4. This is not involving the iliac arteries.

Reproductive: Uterus and bilateral adnexa are unremarkable.

Other: None.

Musculoskeletal: Lower thoracic spine degenerative changes and mild
lumbar spine degenerative changes.

Review of the MIP images confirms the above findings.
IMPRESSION: 1. Two small left lower lobe pulmonary emboli.
2. Mild bibasilar subsegmental atelectasis.
3. Minimal bilateral pleural fluid.
4. Minimal pericardial effusion.
5. Extensive calcified coronary artery atherosclerosis and aortic
atherosclerosis.
6. Interval mild increase in size of the previously demonstrated
cm infrarenal abdominal aortic aneurysm, currently measuring 4.5 cm
in maximum diameter.
7. Tiny, nonobstructing right renal calculus.

Critical Value/emergent results were called by telephone at the time
of interpretation on 11/21/2018 at [DATE] to Ferienhaus
SONATA , who verbally acknowledged these results.

## 2021-06-02 IMAGING — DX DG CHEST 1V PORT
1 series · 1 of 1 positions shown · non-contrast
Comparison: Radiograph 09/06/2010

CLINICAL DATA: Short of breath, chest pain

EXAM:
PORTABLE CHEST 1 VIEW

[chest ap]
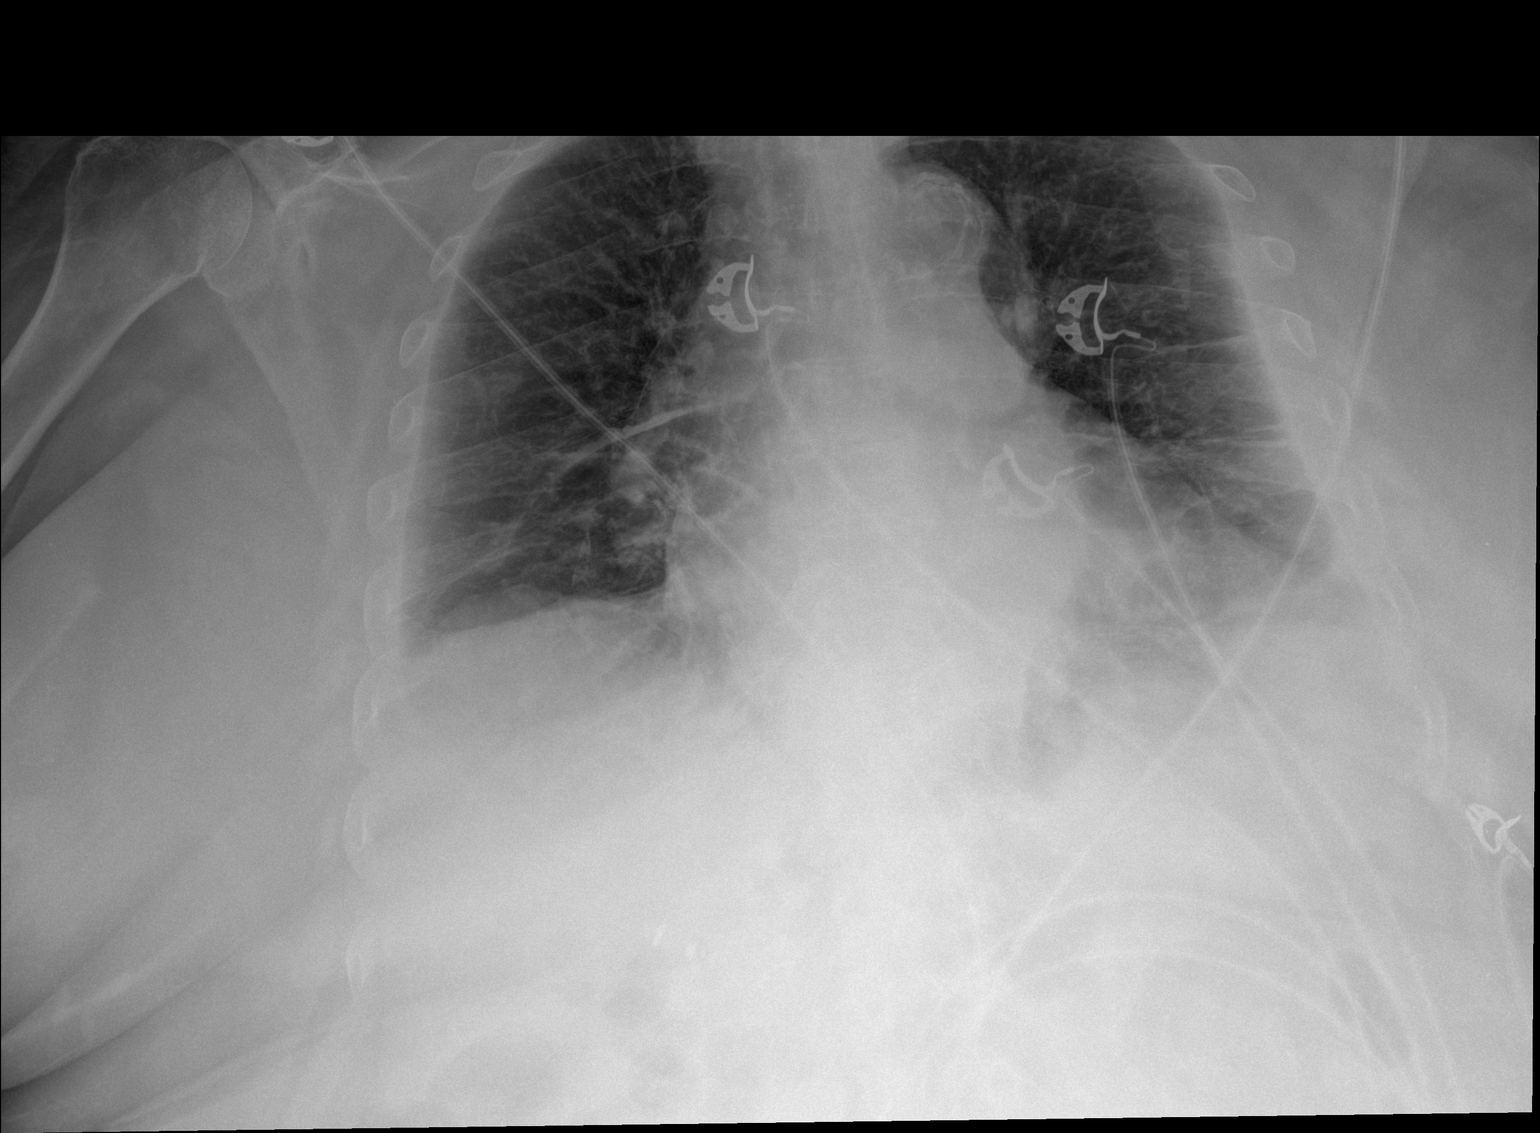

[1 of 1 positions shown; findings below may reference images not displayed]

FINDINGS: Stable enlarged cardiac silhouette. Low lung volumes. Mild venous
congestion. No focal infiltrate. LEFT lung base poorly evaluated.
IMPRESSION: 1. Cardiomegaly and mild venous congestion.
2. Low lung volumes.
3. No focal infiltrate.

## 2021-06-03 IMAGING — US US EXTREM LOW VENOUS
1 series · 13 of 24 positions shown · non-contrast
Comparison: Bilateral lower extremity venous Doppler
ultrasound-07/15/2018

CLINICAL DATA: Bilateral lower extremity pain and edema. History of
previous DVT and pulmonary embolism. Former smoker. Patient is
currently on anticoagulation. Evaluate for acute or chronic DVT.



[Series 1: us extrem low venous · 13 of 58 slices shown]
[im 1/58]
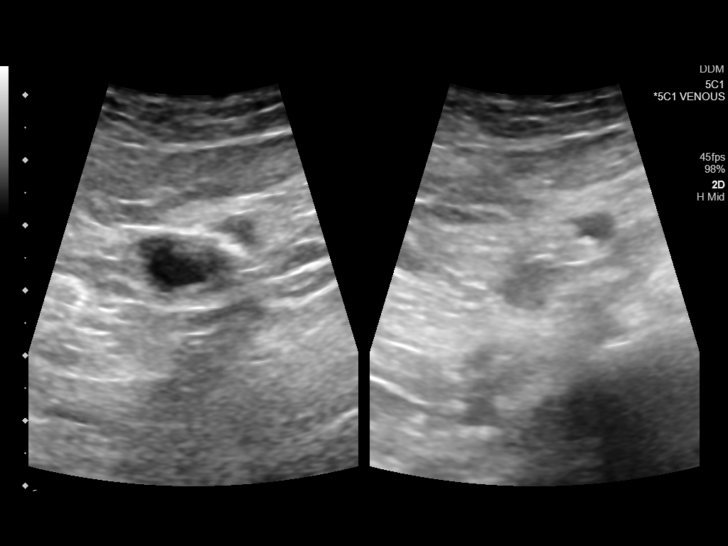
[im 5/58]
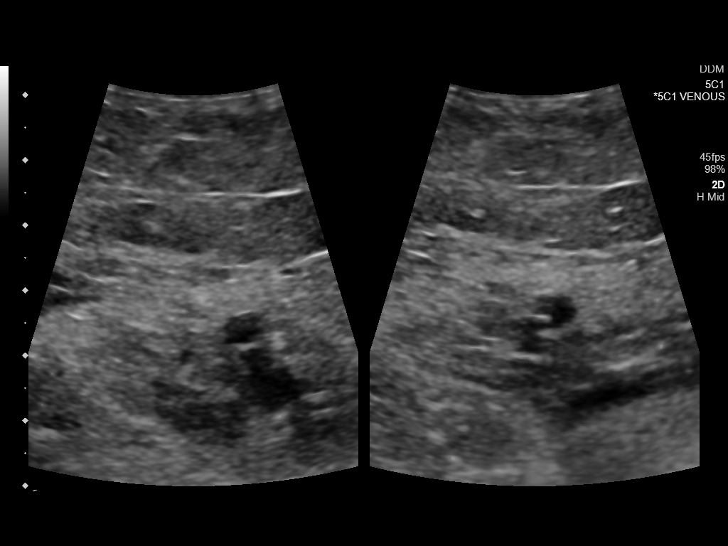
[im 10/58]
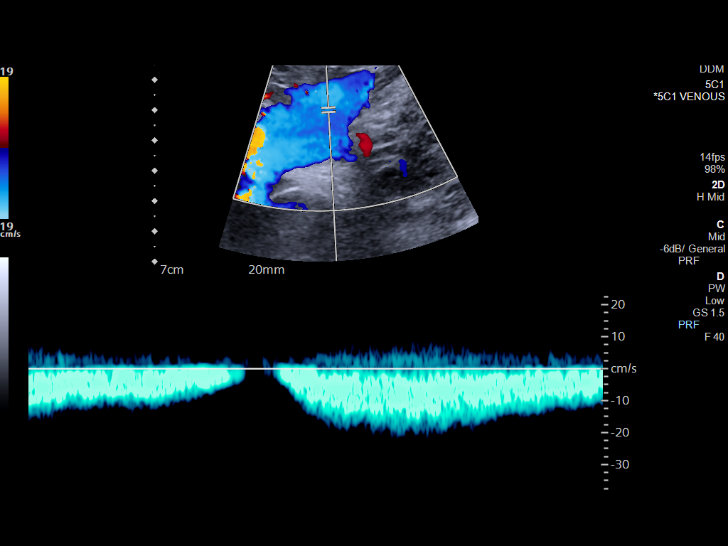
[im 15/58]
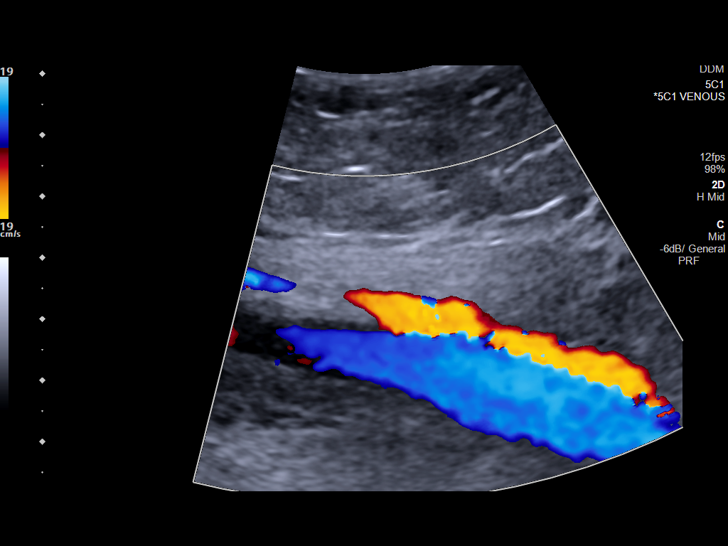
[im 20/58]
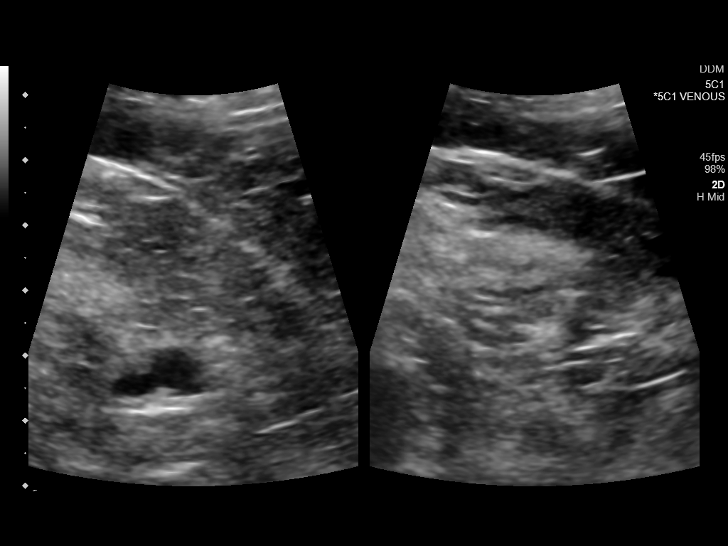
[im 25/58]
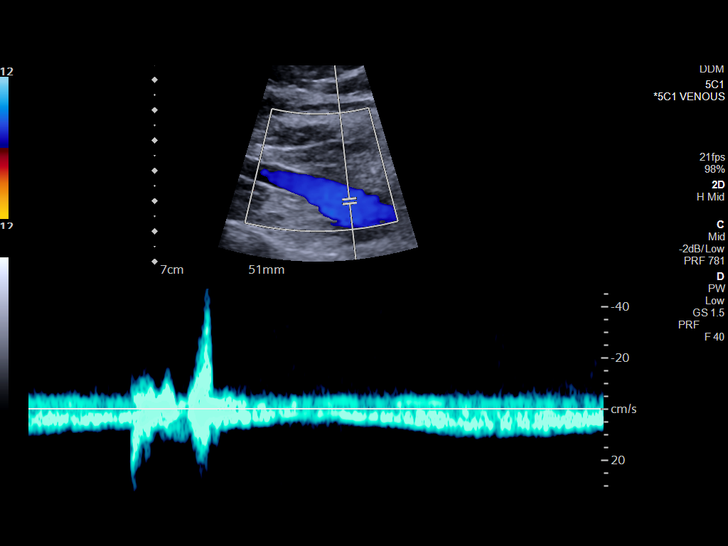
[im 30/58]
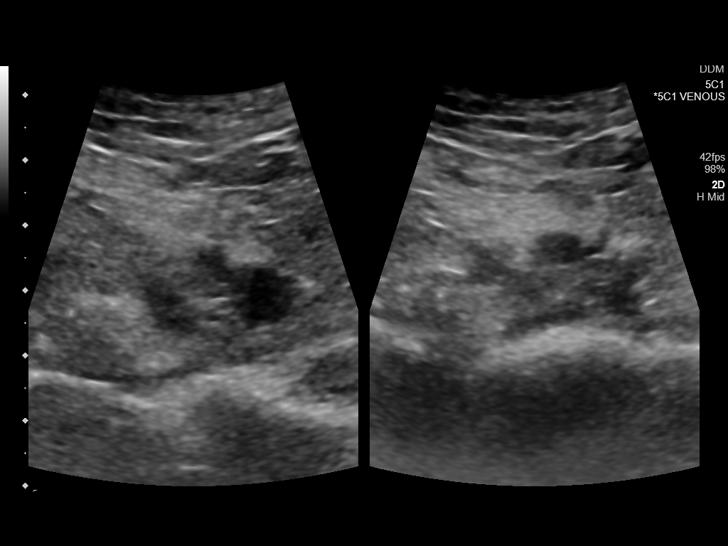
[im 33/58]
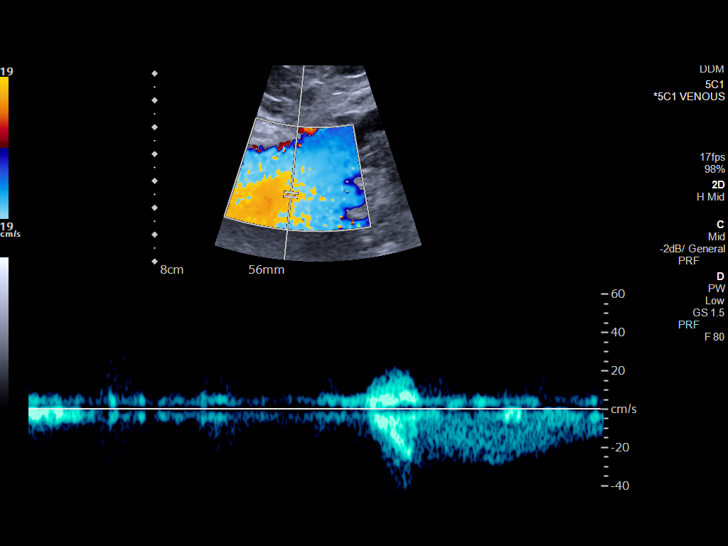
[im 38/58]
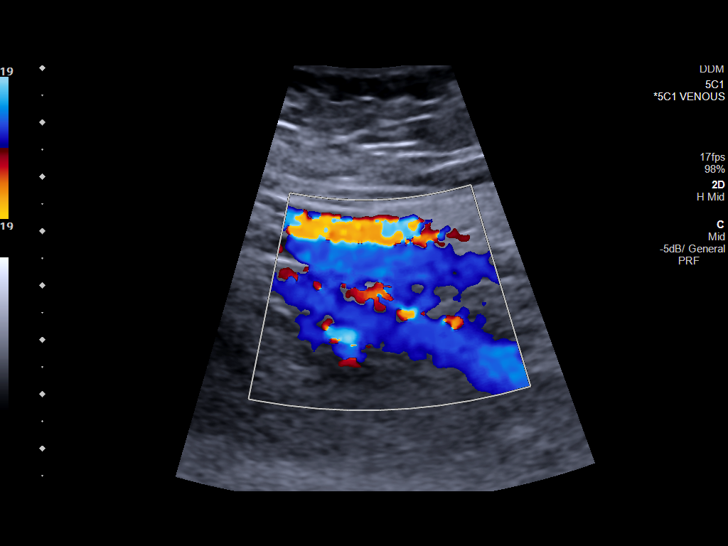
[im 43/58]
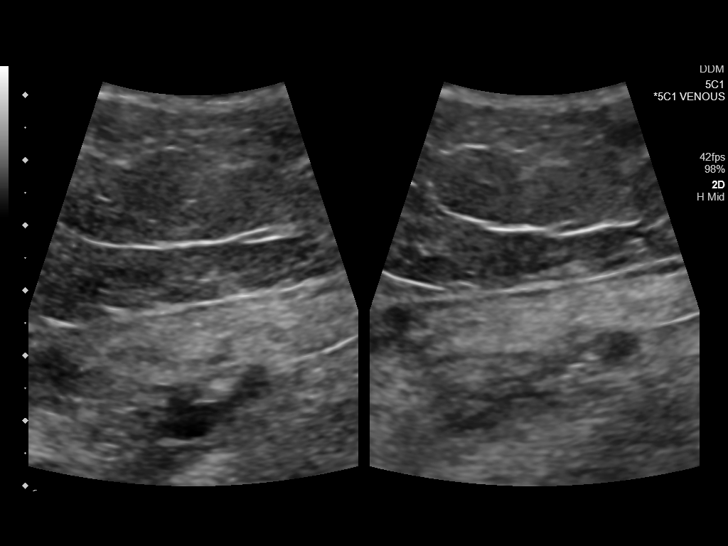
[im 48/58]
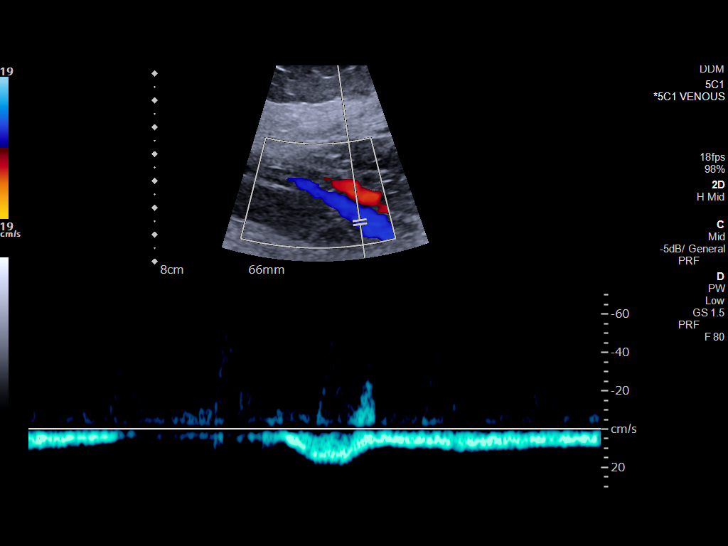
[im 53/58]
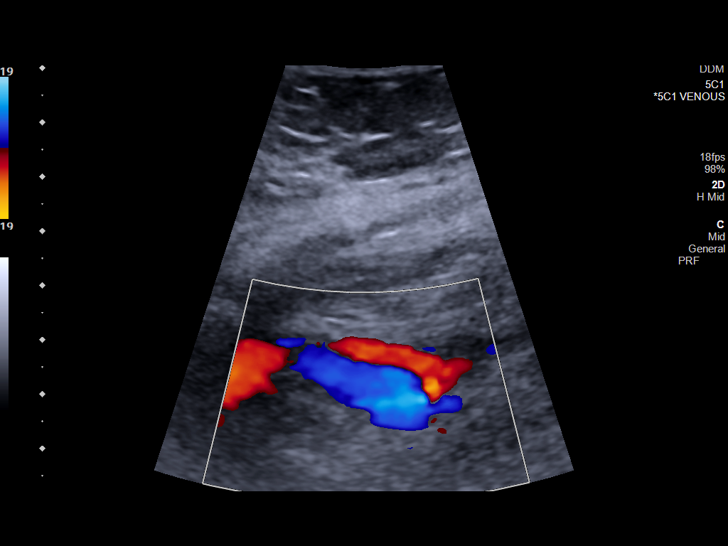
[im 58/58]
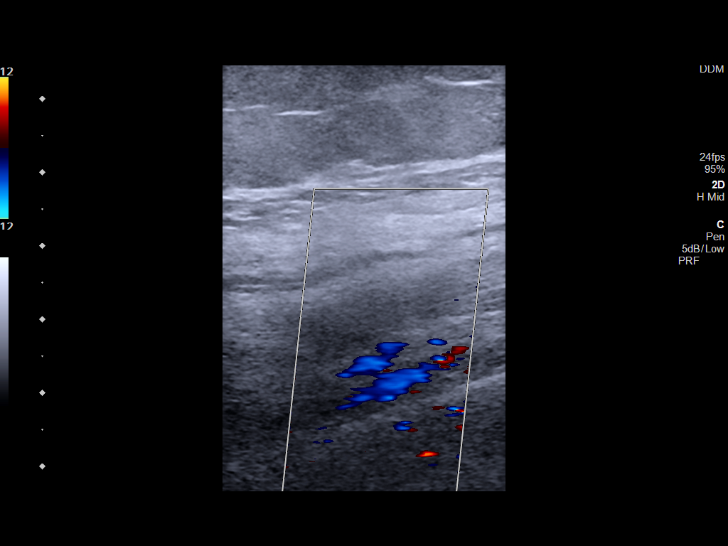

[13 of 24 positions shown; findings below may reference images not displayed]

FINDINGS: RIGHT LOWER EXTREMITY

Common Femoral Vein: No evidence of thrombus. Normal
compressibility, respiratory phasicity and response to augmentation.

Saphenofemoral Junction: No evidence of thrombus. Normal
compressibility and flow on color Doppler imaging.

Profunda Femoral Vein: No evidence of thrombus. Normal
compressibility and flow on color Doppler imaging.

Femoral Vein: No evidence of thrombus. Normal compressibility,
respiratory phasicity and response to augmentation.

Popliteal Vein: No evidence of thrombus. Normal compressibility,
respiratory phasicity and response to augmentation.

Calf Veins: No evidence of thrombus. Normal compressibility and flow
on color Doppler imaging.

Superficial Great Saphenous Vein: No evidence of thrombus. Normal
compressibility.

Venous Reflux:  None.

Other Findings:  None.

LEFT LOWER EXTREMITY

Common Femoral Vein: No evidence of thrombus. Normal
compressibility, respiratory phasicity and response to augmentation.

Saphenofemoral Junction: No evidence of thrombus. Normal
compressibility and flow on color Doppler imaging.

Profunda Femoral Vein: No evidence of thrombus. Normal
compressibility and flow on color Doppler imaging.

Femoral Vein: No evidence of thrombus. Normal compressibility,
respiratory phasicity and response to augmentation.

Popliteal Vein: No evidence of thrombus. Normal compressibility,
respiratory phasicity and response to augmentation.

Calf Veins: No evidence of thrombus. Normal compressibility and flow
on color Doppler imaging.

Superficial Great Saphenous Vein: No evidence of thrombus. Normal
compressibility.

Venous Reflux:  None.

Other Findings:  None.
IMPRESSION: No evidence of acute or chronic DVT within either lower extremity.

## 2021-06-05 IMAGING — DX DG CHEST 1V
1 series · 1 of 1 positions shown · non-contrast
Comparison: CT of 11/21/2018. Most recent chest radiograph
11/21/2018.

CLINICAL DATA: Hemoptysis. Ex-smoker. CHF.

EXAM:
CHEST  1 VIEW

[chest ap]
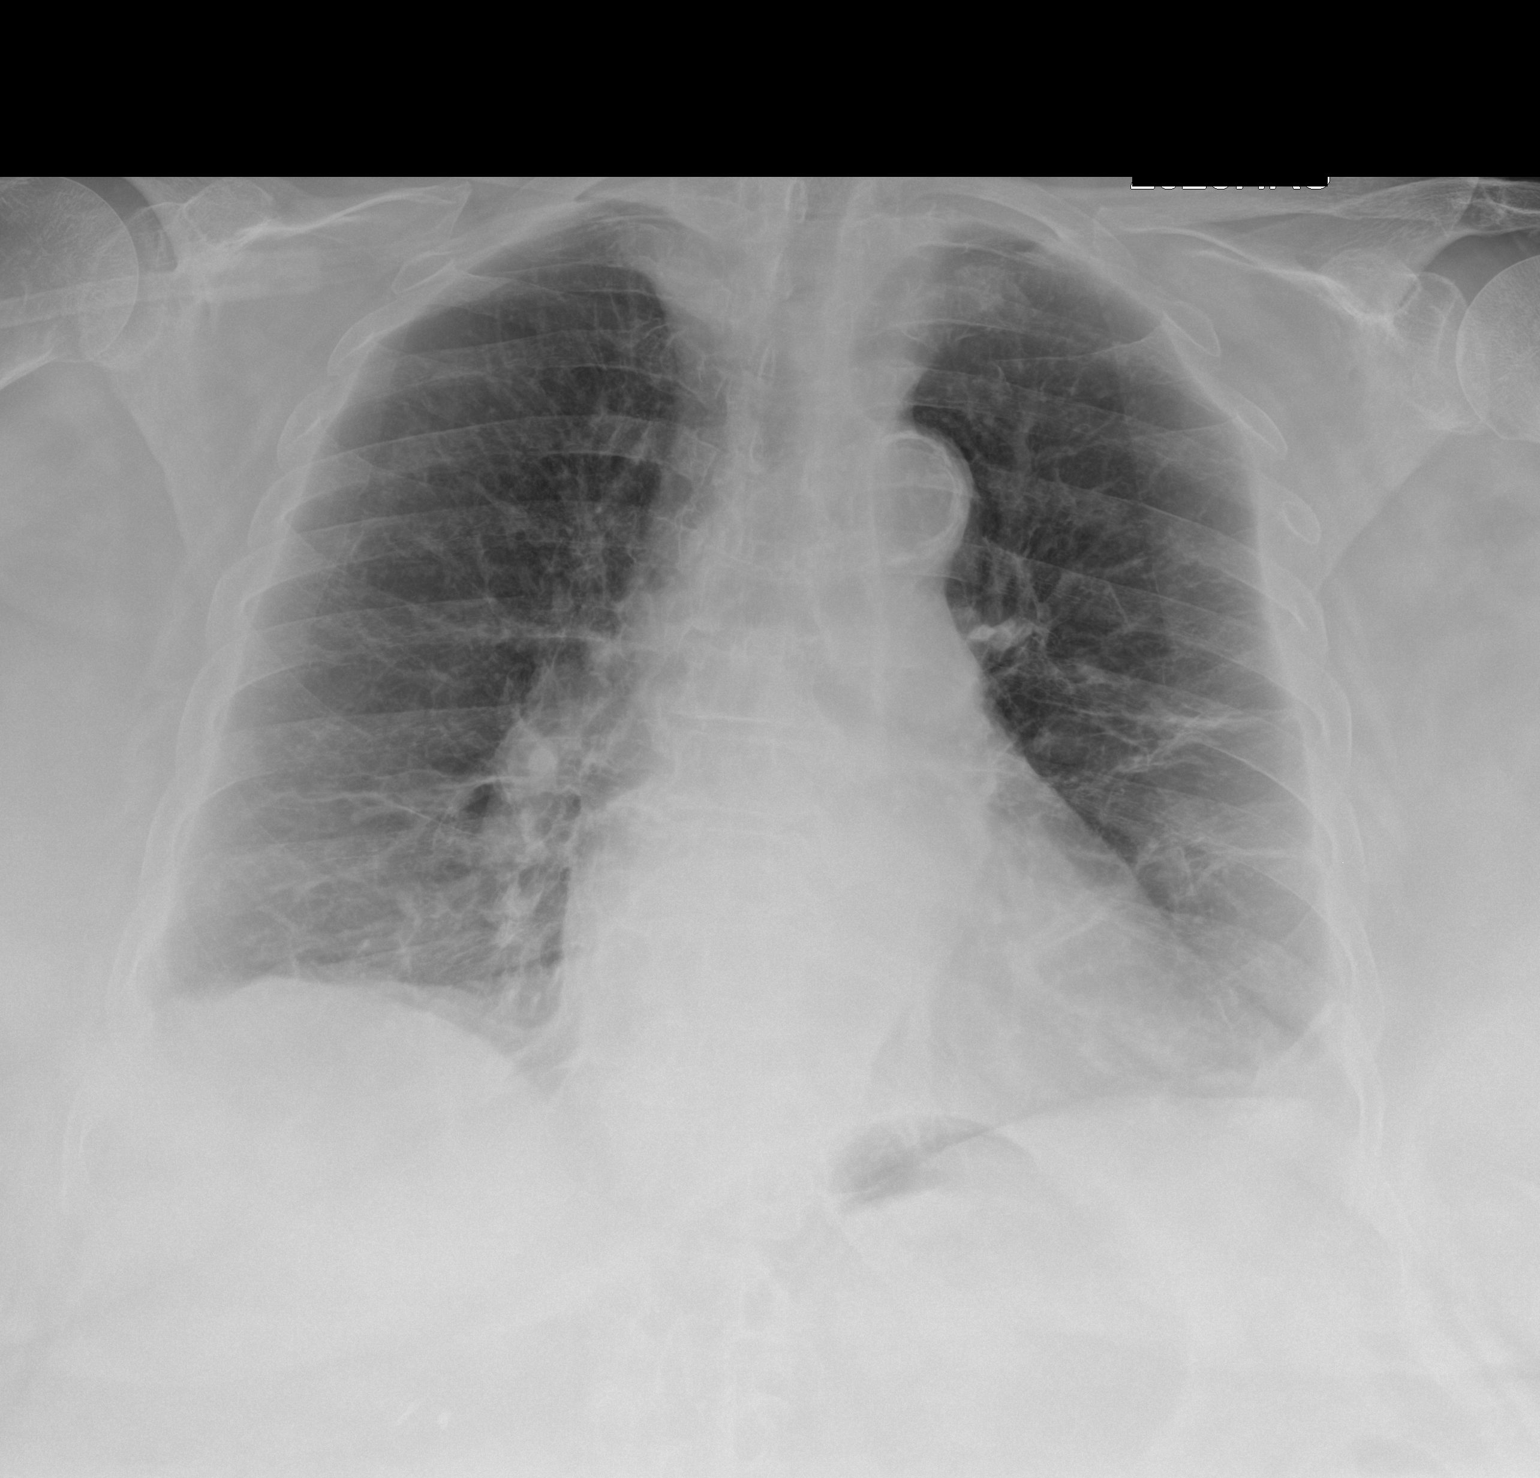

[1 of 1 positions shown; findings below may reference images not displayed]

FINDINGS: Midline trachea. Moderate cardiomegaly. Atherosclerosis in the
transverse aorta. No pleural effusion or pneumothorax. Apical
lordotic positioning. Diffuse peribronchial thickening. No lobar
consolidation. Left lung base scarring.
IMPRESSION: 1. No acute cardiopulmonary disease.
2. Cardiomegaly with chronic interstitial thickening.
3.  Aortic Atherosclerosis ([id number]).

## 2021-06-06 ENCOUNTER — Encounter: Payer: Self-pay | Admitting: Nurse Practitioner

## 2021-06-06 ENCOUNTER — Non-Acute Institutional Stay: Payer: Medicare HMO | Admitting: Nurse Practitioner

## 2021-06-06 DIAGNOSIS — R5381 Other malaise: Secondary | ICD-10-CM

## 2021-06-06 DIAGNOSIS — I509 Heart failure, unspecified: Secondary | ICD-10-CM | POA: Diagnosis not present

## 2021-06-06 DIAGNOSIS — R0602 Shortness of breath: Secondary | ICD-10-CM | POA: Diagnosis not present

## 2021-06-06 NOTE — Progress Notes (Signed)
? ? ?Manufacturing engineer ?Community Palliative Care Consult Note ?Telephone: [phone]  ?Fax: [phone]  ? ? ?Date of encounter: 06/06/21 ?7:14 PM ?PATIENT NAME: Katherine [name] ?Katherine [name] ?Katherine [name] [place] [address]   ?[phone] (home)  ?DOB: 01-29-1941 ?MRN: [id number] ?PRIMARY CARE PROVIDER:    ?Care, [place],  ?[address] ?[place] [address] ?[phone] ? ?RESPONSIBLE PARTY:    ?Contact Information   ? ? Name Relation Home Work Mobile  ? Katherine [name] Spouse [phone]  [phone]  ? Katherine [name], Katherine [name] Daughter   [phone]  ? ?  ? ?I met face to face with patient in facility. Palliative Care was asked to follow this patient by consultation request of  Care, [place] to address advance care planning and complex medical decision making. This is a follow up visit.                                  ?ASSESSMENT AND PLAN / RECOMMENDATIONS:  ?Symptom Management/Plan: ?1. ACP: remains a full code; most form in ACP ?  ?2. Edema stable; secondary to CHF, monitor weights, elevate; discussed nutrition with Katherine [name] ?  ?3. Shortness of breath secondary to CHF, controlled;  continue diuresing, inhalation therapy; Continuous O2,  Continue daily weights;  ?  ?10/23/2020 weight 249.1 lbs ?12/24/2020 weight 245.1 lbs ?01/16/2021 weight 239.0 lbs ?04/30/2021 weight 241.2 lbs ?05/18/2021 weight 240 lbs ? ?4. Debility; discussed with Katherine [name] about getting oob which she declines. Revisited with Katherine [name] updated Katherine [name] getting out of bed, explained non-compliance, if she declines to staff when they try to get her oob.  ?  ?5. Palliative care encounter; Palliative medicine team will continue to support patient, patient's family, and medical team. Visit consisted of counseling and education dealing with the complex and emotionally intense issues of symptom management and palliative care in the setting of serious and potentially life-threatening illness ? ?Follow up Palliative Care Visit:  Palliative care will continue to follow for complex medical decision making, advance care planning, and clarification of goals. Return 4 weeks or prn. ? ?I spent 46 minutes providing this consultation. More than 50% of the time in this consultation was spent in counseling and care coordination. ?PPS: 30% ? ?Chief Complaint: Follow up palliative consult for complex medical decision making ? ?HISTORY OF PRESENT ILLNESS:  Katherine [name] is a 81 y.o. 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 Hospitalized 04/19/2021 for acute on chronic respiratory failure secondary to hypoxia, hypercapnia, acute metabolic encephalopathy, UTI, AKI on CKD 3b, CHF requiring bipap/abx stabilized and discharged back to [place] at [facility]. Katherine [name] remains bed bound, require staff to assist her with turning, positioning, bathing, dressing. Katherine [name] does feed herself after tray setup. Katherine [name] has been feeding herself per staff. Appetite fair depending what is being served. Staff endorses no other changes or concerns. I visited and observed Katherine [name] as she was sleeping, she awoke to verbal cues. Katherine [name] made eye contact, was cooperative with assessment, though recurrent falling  asleep during the [facility] visit. Support provided. No visitors present. Medical goals reviewed. I attempted to contact Katherine [name], no answer, updated staff, no changes  to goc.  ?  .  ?History obtained from review of EMR, discussion with primary team, and interview with family, facility staff/caregiver and/or Katherine [name].  ?I reviewed available labs, medications, imaging, studies and related documents from the EMR.  Records reviewed and summarized above.  ? ?ROS ?10 point system reviewed with staff as Katherine [name] has cognitive impairment with Katherine [name] being sleepy today ? ?Physical Exam: ?Constitutional: NAD ?General: obese, debilitated, chronically ill female ?EYES:  lids intact ?ENMT: oral mucous membranes moist ?CV: S1S2, RRR ?Pulmonary: decrease bases, no increased work of breathing, no cough ?Abdomen: normo-active BS + 4 quadrants, soft and non tender ?MSK: bed-bound; non-ambulatory ?Skin: warm and dry ?Neuro:  + generalized weakness,  + cognitive impairment ?Psych: flat affect, A and Oriented to self ?Thank you for the opportunity to participate in the care of Katherine [name].  The palliative care team will continue to follow. Please call our office at [phone] if we can be of additional assistance.  ? ?Jamari Moten Z Loghan Subia, NP   ?

## 2021-06-07 DIAGNOSIS — I5032 Chronic diastolic (congestive) heart failure: Secondary | ICD-10-CM | POA: Diagnosis not present

## 2021-06-07 DIAGNOSIS — J441 Chronic obstructive pulmonary disease with (acute) exacerbation: Secondary | ICD-10-CM | POA: Diagnosis not present

## 2021-06-07 DIAGNOSIS — J9622 Acute and chronic respiratory failure with hypercapnia: Secondary | ICD-10-CM | POA: Diagnosis not present

## 2021-06-07 DIAGNOSIS — S80811A Abrasion, right lower leg, initial encounter: Secondary | ICD-10-CM | POA: Diagnosis not present

## 2021-06-14 DIAGNOSIS — J441 Chronic obstructive pulmonary disease with (acute) exacerbation: Secondary | ICD-10-CM | POA: Diagnosis not present

## 2021-06-14 DIAGNOSIS — J9622 Acute and chronic respiratory failure with hypercapnia: Secondary | ICD-10-CM | POA: Diagnosis not present

## 2021-06-14 DIAGNOSIS — I5032 Chronic diastolic (congestive) heart failure: Secondary | ICD-10-CM | POA: Diagnosis not present

## 2021-06-14 DIAGNOSIS — S80811A Abrasion, right lower leg, initial encounter: Secondary | ICD-10-CM | POA: Diagnosis not present

## 2021-06-18 ENCOUNTER — Inpatient Hospital Stay (HOSPITAL_COMMUNITY)
Admission: EM | Admit: 2021-06-18 | Discharge: 2021-06-21 | DRG: 101 | Disposition: A | Discharge status: Skilled Nursing Facility | Payer: Medicare HMO | Source: Other Acute Inpatient Hospital | Attending: Family Medicine | Admitting: Family Medicine

## 2021-06-18 ENCOUNTER — Emergency Department (HOSPITAL_COMMUNITY): Payer: Medicare HMO

## 2021-06-18 ENCOUNTER — Observation Stay (HOSPITAL_COMMUNITY): Payer: Medicare HMO

## 2021-06-18 ENCOUNTER — Encounter (HOSPITAL_COMMUNITY): Payer: Self-pay | Admitting: Student

## 2021-06-18 DIAGNOSIS — I714 Abdominal aortic aneurysm, without rupture, unspecified: Secondary | ICD-10-CM | POA: Diagnosis present

## 2021-06-18 DIAGNOSIS — D631 Anemia in chronic kidney disease: Secondary | ICD-10-CM | POA: Diagnosis present

## 2021-06-18 DIAGNOSIS — E1122 Type 2 diabetes mellitus with diabetic chronic kidney disease: Secondary | ICD-10-CM | POA: Diagnosis not present

## 2021-06-18 DIAGNOSIS — K219 Gastro-esophageal reflux disease without esophagitis: Secondary | ICD-10-CM | POA: Diagnosis present

## 2021-06-18 DIAGNOSIS — J9811 Atelectasis: Secondary | ICD-10-CM | POA: Diagnosis not present

## 2021-06-18 DIAGNOSIS — I5032 Chronic diastolic (congestive) heart failure: Secondary | ICD-10-CM | POA: Diagnosis present

## 2021-06-18 DIAGNOSIS — R569 Unspecified convulsions: Secondary | ICD-10-CM | POA: Diagnosis not present

## 2021-06-18 DIAGNOSIS — E785 Hyperlipidemia, unspecified: Secondary | ICD-10-CM | POA: Diagnosis present

## 2021-06-18 DIAGNOSIS — R4789 Other speech disturbances: Secondary | ICD-10-CM

## 2021-06-18 DIAGNOSIS — R531 Weakness: Secondary | ICD-10-CM | POA: Diagnosis not present

## 2021-06-18 DIAGNOSIS — J9611 Chronic respiratory failure with hypoxia: Secondary | ICD-10-CM | POA: Diagnosis not present

## 2021-06-18 DIAGNOSIS — F09 Unspecified mental disorder due to known physiological condition: Secondary | ICD-10-CM

## 2021-06-18 DIAGNOSIS — Z96651 Presence of right artificial knee joint: Secondary | ICD-10-CM | POA: Diagnosis present

## 2021-06-18 DIAGNOSIS — N184 Chronic kidney disease, stage 4 (severe): Secondary | ICD-10-CM | POA: Diagnosis present

## 2021-06-18 DIAGNOSIS — F329 Major depressive disorder, single episode, unspecified: Secondary | ICD-10-CM | POA: Diagnosis present

## 2021-06-18 DIAGNOSIS — D696 Thrombocytopenia, unspecified: Secondary | ICD-10-CM | POA: Diagnosis not present

## 2021-06-18 DIAGNOSIS — G40909 Epilepsy, unspecified, not intractable, without status epilepticus: Principal | ICD-10-CM | POA: Diagnosis present

## 2021-06-18 DIAGNOSIS — Z8673 Personal history of transient ischemic attack (TIA), and cerebral infarction without residual deficits: Secondary | ICD-10-CM

## 2021-06-18 DIAGNOSIS — I10 Essential (primary) hypertension: Secondary | ICD-10-CM | POA: Diagnosis not present

## 2021-06-18 DIAGNOSIS — Z87440 Personal history of urinary (tract) infections: Secondary | ICD-10-CM

## 2021-06-18 DIAGNOSIS — F0284 Dementia in other diseases classified elsewhere, unspecified severity, with anxiety: Secondary | ICD-10-CM | POA: Diagnosis present

## 2021-06-18 DIAGNOSIS — E875 Hyperkalemia: Secondary | ICD-10-CM | POA: Diagnosis present

## 2021-06-18 DIAGNOSIS — F0283 Dementia in other diseases classified elsewhere, unspecified severity, with mood disturbance: Secondary | ICD-10-CM | POA: Diagnosis present

## 2021-06-18 DIAGNOSIS — I499 Cardiac arrhythmia, unspecified: Secondary | ICD-10-CM | POA: Diagnosis not present

## 2021-06-18 DIAGNOSIS — Z6841 Body Mass Index (BMI) 40.0 and over, adult: Secondary | ICD-10-CM | POA: Diagnosis not present

## 2021-06-18 DIAGNOSIS — I152 Hypertension secondary to endocrine disorders: Secondary | ICD-10-CM | POA: Diagnosis present

## 2021-06-18 DIAGNOSIS — Z86711 Personal history of pulmonary embolism: Secondary | ICD-10-CM

## 2021-06-18 DIAGNOSIS — R4189 Other symptoms and signs involving cognitive functions and awareness: Secondary | ICD-10-CM | POA: Diagnosis present

## 2021-06-18 DIAGNOSIS — G4733 Obstructive sleep apnea (adult) (pediatric): Secondary | ICD-10-CM | POA: Diagnosis not present

## 2021-06-18 DIAGNOSIS — Z8616 Personal history of COVID-19: Secondary | ICD-10-CM

## 2021-06-18 DIAGNOSIS — J431 Panlobular emphysema: Secondary | ICD-10-CM | POA: Diagnosis not present

## 2021-06-18 DIAGNOSIS — Z832 Family history of diseases of the blood and blood-forming organs and certain disorders involving the immune mechanism: Secondary | ICD-10-CM

## 2021-06-18 DIAGNOSIS — Z743 Need for continuous supervision: Secondary | ICD-10-CM | POA: Diagnosis not present

## 2021-06-18 DIAGNOSIS — E860 Dehydration: Secondary | ICD-10-CM | POA: Diagnosis not present

## 2021-06-18 DIAGNOSIS — E662 Morbid (severe) obesity with alveolar hypoventilation: Secondary | ICD-10-CM | POA: Diagnosis not present

## 2021-06-18 DIAGNOSIS — R319 Hematuria, unspecified: Secondary | ICD-10-CM | POA: Diagnosis present

## 2021-06-18 DIAGNOSIS — J449 Chronic obstructive pulmonary disease, unspecified: Secondary | ICD-10-CM | POA: Diagnosis present

## 2021-06-18 DIAGNOSIS — Z7401 Bed confinement status: Secondary | ICD-10-CM | POA: Diagnosis not present

## 2021-06-18 DIAGNOSIS — E1169 Type 2 diabetes mellitus with other specified complication: Secondary | ICD-10-CM | POA: Diagnosis present

## 2021-06-18 DIAGNOSIS — E118 Type 2 diabetes mellitus with unspecified complications: Secondary | ICD-10-CM | POA: Diagnosis not present

## 2021-06-18 DIAGNOSIS — G319 Degenerative disease of nervous system, unspecified: Secondary | ICD-10-CM | POA: Diagnosis not present

## 2021-06-18 DIAGNOSIS — R4701 Aphasia: Secondary | ICD-10-CM | POA: Diagnosis present

## 2021-06-18 DIAGNOSIS — Z79899 Other long term (current) drug therapy: Secondary | ICD-10-CM

## 2021-06-18 DIAGNOSIS — Z7951 Long term (current) use of inhaled steroids: Secondary | ICD-10-CM

## 2021-06-18 DIAGNOSIS — R29818 Other symptoms and signs involving the nervous system: Secondary | ICD-10-CM

## 2021-06-18 DIAGNOSIS — E559 Vitamin D deficiency, unspecified: Secondary | ICD-10-CM | POA: Diagnosis present

## 2021-06-18 DIAGNOSIS — I672 Cerebral atherosclerosis: Secondary | ICD-10-CM | POA: Diagnosis not present

## 2021-06-18 DIAGNOSIS — E1140 Type 2 diabetes mellitus with diabetic neuropathy, unspecified: Secondary | ICD-10-CM | POA: Diagnosis present

## 2021-06-18 DIAGNOSIS — Z87891 Personal history of nicotine dependence: Secondary | ICD-10-CM

## 2021-06-18 DIAGNOSIS — R4182 Altered mental status, unspecified: Secondary | ICD-10-CM | POA: Diagnosis not present

## 2021-06-18 DIAGNOSIS — F419 Anxiety disorder, unspecified: Secondary | ICD-10-CM | POA: Diagnosis present

## 2021-06-18 DIAGNOSIS — E1159 Type 2 diabetes mellitus with other circulatory complications: Secondary | ICD-10-CM | POA: Diagnosis not present

## 2021-06-18 DIAGNOSIS — Z8249 Family history of ischemic heart disease and other diseases of the circulatory system: Secondary | ICD-10-CM

## 2021-06-18 DIAGNOSIS — J329 Chronic sinusitis, unspecified: Secondary | ICD-10-CM | POA: Diagnosis not present

## 2021-06-18 DIAGNOSIS — I2510 Atherosclerotic heart disease of native coronary artery without angina pectoris: Secondary | ICD-10-CM | POA: Diagnosis present

## 2021-06-18 DIAGNOSIS — R29810 Facial weakness: Secondary | ICD-10-CM | POA: Diagnosis not present

## 2021-06-18 DIAGNOSIS — G250 Essential tremor: Secondary | ICD-10-CM | POA: Diagnosis present

## 2021-06-18 DIAGNOSIS — Z9049 Acquired absence of other specified parts of digestive tract: Secondary | ICD-10-CM

## 2021-06-18 DIAGNOSIS — R404 Transient alteration of awareness: Secondary | ICD-10-CM | POA: Diagnosis not present

## 2021-06-18 DIAGNOSIS — R0689 Other abnormalities of breathing: Secondary | ICD-10-CM | POA: Diagnosis not present

## 2021-06-18 HISTORY — DX: Heart failure, unspecified: I50.9

## 2021-06-18 HISTORY — DX: Altered mental status, unspecified: R41.82

## 2021-06-18 HISTORY — DX: Disorientation, unspecified: R41.0

## 2021-06-18 HISTORY — DX: Encounter for palliative care: Z51.5

## 2021-06-18 LAB — COMPREHENSIVE METABOLIC PANEL
ALT: 10 U/L (ref 0–44)
AST: 15 U/L (ref 15–41)
Albumin: 3.3 g/dL — ABNORMAL LOW (ref 3.5–5.0)
Alkaline Phosphatase: 86 U/L (ref 38–126)
Anion gap: 9 (ref 5–15)
BUN: 29 mg/dL — ABNORMAL HIGH (ref 8–23)
CO2: 31 mmol/L (ref 22–32)
Calcium: 9.3 mg/dL (ref 8.9–10.3)
Chloride: 102 mmol/L (ref 98–111)
Creatinine, Ser: 2.01 mg/dL — ABNORMAL HIGH (ref 0.44–1.00)
GFR, Estimated: 25 mL/min — ABNORMAL LOW (ref >60)
Glucose, Bld: 90 mg/dL (ref 70–99)
Potassium: 5.2 mmol/L — ABNORMAL HIGH (ref 3.5–5.1)
Sodium: 142 mmol/L (ref 135–145)
Total Bilirubin: 1 mg/dL (ref 0.3–1.2)
Total Protein: 7.1 g/dL (ref 6.5–8.1)

## 2021-06-18 LAB — I-STAT CHEM 8, ED
BUN: 33 mg/dL — ABNORMAL HIGH (ref 8–23)
Calcium, Ion: 1.13 mmol/L — ABNORMAL LOW (ref 1.15–1.40)
Chloride: 101 mmol/L (ref 98–111)
Creatinine, Ser: 2 mg/dL — ABNORMAL HIGH (ref 0.44–1.00)
Glucose, Bld: 89 mg/dL (ref 70–99)
HCT: 40 % (ref 36.0–46.0)
Hemoglobin: 13.6 g/dL (ref 12.0–15.0)
Potassium: 4.9 mmol/L (ref 3.5–5.1)
Sodium: 143 mmol/L (ref 135–145)
TCO2: 36 mmol/L — ABNORMAL HIGH (ref 22–32)

## 2021-06-18 LAB — CBC
HCT: 40.1 % (ref 36.0–46.0)
Hemoglobin: 12.4 g/dL (ref 12.0–15.0)
MCH: 30.8 pg (ref 26.0–34.0)
MCHC: 30.9 g/dL (ref 30.0–36.0)
MCV: 99.8 fL (ref 80.0–100.0)
Platelets: 142 10*3/uL — ABNORMAL LOW (ref 150–400)
RBC: 4.02 MIL/uL (ref 3.87–5.11)
RDW: 12 % (ref 11.5–15.5)
WBC: 5 10*3/uL (ref 4.0–10.5)
nRBC: 0 % (ref 0.0–0.2)

## 2021-06-18 LAB — DIFFERENTIAL
Abs Immature Granulocytes: 0.02 10*3/uL (ref 0.00–0.07)
Basophils Absolute: 0 10*3/uL (ref 0.0–0.1)
Basophils Relative: 0 %
Eosinophils Absolute: 0.2 10*3/uL (ref 0.0–0.5)
Eosinophils Relative: 3 %
Immature Granulocytes: 0 %
Lymphocytes Relative: 34 %
Lymphs Abs: 1.7 10*3/uL (ref 0.7–4.0)
Monocytes Absolute: 0.4 10*3/uL (ref 0.1–1.0)
Monocytes Relative: 9 %
Neutro Abs: 2.7 10*3/uL (ref 1.7–7.7)
Neutrophils Relative %: 54 %

## 2021-06-18 LAB — PROTIME-INR
INR: 0.9 (ref 0.8–1.2)
Prothrombin Time: 11.9 seconds (ref 11.4–15.2)

## 2021-06-18 LAB — CBG MONITORING, ED
Glucose-Capillary: 102 mg/dL — ABNORMAL HIGH (ref 70–99)
Glucose-Capillary: 102 mg/dL — ABNORMAL HIGH (ref 70–99)

## 2021-06-18 LAB — APTT: aPTT: 27 seconds (ref 24–36)

## 2021-06-18 LAB — HEMOGLOBIN A1C
Hgb A1c MFr Bld: 5.6 % (ref 4.8–5.6)
Mean Plasma Glucose: 114.02 mg/dL

## 2021-06-18 MED ORDER — SODIUM CHLORIDE 0.9 % IV SOLN
200.0000 mg | INTRAVENOUS | Status: DC
  Administered 2021-06-18 – 2021-06-20 (×3): 200 mg via INTRAVENOUS
  Filled 2021-06-18 (×3): qty 20

## 2021-06-18 MED ORDER — ENOXAPARIN SODIUM 30 MG/0.3ML IJ SOSY
30.0000 mg | PREFILLED_SYRINGE | INTRAMUSCULAR | Status: DC
  Administered 2021-06-18 – 2021-06-21 (×4): 30 mg via SUBCUTANEOUS
  Filled 2021-06-18 (×5): qty 0.3

## 2021-06-18 MED ORDER — ORAL CARE MOUTH RINSE
15.0000 mL | OROMUCOSAL | Status: DC
  Administered 2021-06-18 – 2021-06-21 (×18): 15 mL via OROMUCOSAL

## 2021-06-18 MED ORDER — SODIUM CHLORIDE 0.9% FLUSH
3.0000 mL | Freq: Once | INTRAVENOUS | Status: AC
  Administered 2021-06-18: 3 mL via INTRAVENOUS

## 2021-06-18 MED ORDER — CHLORHEXIDINE GLUCONATE 0.12% ORAL RINSE (MEDLINE KIT): 15.0000 mL | Freq: Two times a day (BID) | OROMUCOSAL | Status: DC

## 2021-06-18 MED ORDER — LEVETIRACETAM 750 MG PO TABS: 750.0000 mg | ORAL_TABLET | Freq: Two times a day (BID) | ORAL | Status: DC

## 2021-06-18 MED ORDER — LACTATED RINGERS IV BOLUS
500.0000 mL | Freq: Once | INTRAVENOUS | Status: AC
  Administered 2021-06-18: 500 mL via INTRAVENOUS

## 2021-06-18 MED ORDER — LACOSAMIDE 100 MG PO TABS: 100.0000 mg | ORAL_TABLET | Freq: Two times a day (BID) | ORAL | Status: DC

## 2021-06-18 MED ORDER — INSULIN ASPART 100 UNIT/ML IJ SOLN: 0.0000 [IU] | Freq: Three times a day (TID) | INTRAMUSCULAR | Status: DC

## 2021-06-18 MED ORDER — SODIUM CHLORIDE 0.9 % IV SOLN
750.0000 mg | Freq: Two times a day (BID) | INTRAVENOUS | Status: DC
  Administered 2021-06-18 – 2021-06-20 (×5): 750 mg via INTRAVENOUS
  Filled 2021-06-18 (×8): qty 7.5

## 2021-06-18 NOTE — ED Notes (Signed)
Assumed care of this patient at this time. Patient is asleep and arousable to painful stimuli. Pt will not follow commands. Pt will respond with mumbled speech. ?

## 2021-06-18 NOTE — Progress Notes (Signed)
EEG complete - results pending 

## 2021-06-18 NOTE — H&P (Addendum)
Family Medicine Teaching Service ?Hospital Admission History and Physical ?Service Pager: [phone] ? ?Patient name: Katherine [name] Medical record number: [id number] ?Date of birth: 02/05/1941 Age: 81 y.o. Gender: female ? ?Primary Care Provider: Care, [place] ?Consultants: Neurology ?Code Status: Full Code - unable to contact emergency contact to confirm. ?Preferred Emergency Contact:  Primary Emergency Contact: Bennie Hind, Home Phone: [phone] ? ?Chief Complaint:  Recurrent aphasia ? ?Assessment and Plan: ?Katherine [name] is a 81 y.o. female with PMH seizures, HFpEF (07/2018), HTN, DM2, CKD3, COPD, stroke, who presented to [place] (06/18/2021) as code stroke from SNF for recurrent aphasia and RUE weakness admitted for overnight EEG for seizure and encephalopathy work-up. Was recently hospitalized for hypercarbia and UTI where she was discharged on BiPAP and antibiotics ? 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 No MRI, no tNK.  However patient has had recurrent aphasia episodes at SNF, neurology recommended admission to observation for overnight EEG and to continue her home antiseizure medicines.  ?On initial encounter, patient was in no acute distress, and not engaged with evaluation vs encephalopathic.  However she was noted to move all 4 limbs equally.  She was perseverative on the fact that she is in the hospital and that she does not know what we are talking about.  Unable to obtain coherent history from patient.  Vital signs are stable, currently on 3 L nasal cannula.  No white count, no fever, no electrolyte derangements, LFTs wnl. Per ED provider, she is at baseline.  Was unable to call family or SNF facility to confirm baseline, we will try again. ?Patient admitted to med-tele with attending McDiarmid, Blane Ohara, MD  ?Vitals  per unit routine ?PT/OT eval and treat ?Neurology following, appreciate assistance recommendations ?Home: keppra 750 mg BID PO, vimpat 200mg  daily PO ?Diet: NPO until SLP  ?rEEG, cEEG ?Restarted home keppra and vimpat IV per neuro ? ?Encephalopathy r/o ?CBC, CMP, UA, TSH ? ?AoCKD3B ?Baseline Cr 1.6. Cr (!) 2.00.  ?Possibly hypovolemic, will give IVF slowly ?IV LR bolus 521ml/hr ?Labs per above ? ?Thrombocytopenia ?PLT 142 ?Labs per above ? ?HFpEF (07/2018), HTN ?Wt on admission 108 kg, down from 111.3 kg 04/19/2021.  ?Home: Propranolol 10 mg twice daily-held pending pharm med rec ?Strict I/Os & daily weights ?K+ >4, Mg2+ >2, replete as needed ? ?DM2 with neuropathy, diet controlled ?A1c 5.3 (12/28/2019) ?Home: gabapentin 300mg  daily - held pending pharm med rec ?CBGs, mSSI  ?A1c pending ? 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 ?GERD ?Home: Pantoprazole 20 mg daily-held pending pharm med rec ?Tobacco abuse, anxiety depression ? ?MDD, GAD ?Home: Zoloft 25 mg-held pending pharm med rec ? ?Canones ?Sees palliative outpatient for assistance with chronic conditions ? ?Prophylaxis: enoxaparin (LOVENOX) injection 30 mg Start: 06/18/21 1345  Lovenox = 0.5/kilogram ? ?Disposition:  ? ? ?History of Present Illness:   ?Katherine [name] is a 81 y.o. female presenting with recurrent aphasia ? ?Patient keeps repeating " I dont know what you're talking about" Unable to answer any questions.  ? ? husband visited her and saw RUE weakness and aphasia. SNF staff said it happens frequently throughout week, but she would recover so thought nothing of it. Husband didn't know it was happening all week until he witnessed it this  weekend then called EMS.  ? ?Review Of Systems:  ?Per HPI with the following additions: ? ?Unable to assess ? ?Patient Active Problem List  ? Diagnosis Date Noted  ? Seizure-like activity ([place]) 06/18/2021  ? Acute respiratory failure  with hypoxia and hypercapnia ([place]) 04/19/2021  ? Type 2 diabetes mellitus with stage 4 chronic kidney disease ([place]) 12/28/2019  ? Sepsis due to gram-negative UTI ([place]) 06/19/2019  ? Type II diabetes mellitus with renal manifestations ([place]) 06/19/2019  ? Stroke ([place]) 06/19/2019  ? AAA (abdominal aortic aneurysm) ([place]) 06/19/2019  ? Iron deficiency anemia   ? Altered mental status   ? Acute encephalopathy 02/11/2019  ? Sepsis secondary to UTI ([place]) 01/08/2019  ? Acute renal failure superimposed on stage 3b chronic kidney disease ([place]) 01/08/2019  ? Seizure disorder ([place]) 01/08/2019  ? Diabetes mellitus type 2, controlled, with complications ([place]) 37/62/8315  ? Acute on chronic anemia 01/03/2019  ? COVID-19 12/21/2018  ? COVID-19 virus infection 12/21/2018  ? Hemoptysis 12/20/2018  ? Pulmonary embolism ([place]) 11/21/2018  ? Delirium   ? UTI (urinary tract infection) 08/21/2018  ? Chest pain 07/15/2018  ? Palliative care encounter 05/10/2018  ? Localized edema 05/10/2018  ? Shortness of breath 05/10/2018  ? Hematemesis 04/28/2018  ? Influenza A 04/13/2018  ? Acute on chronic congestive heart failure ([place])   ? COPD with acute exacerbation ([place]) 02/24/2018  ? Chronic diastolic heart failure ([place]) 12/31/2017  ? Lymphedema 12/31/2017  ? COPD exacerbation ([place]) 11/30/2017  ? Urinary tract infection 11/22/2017  ? 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[place]) 11/16/2017  ? Diabetes mellitus type 2, uncomplicated ([place]) 17/61/6073  ? Pressure injury of skin 10/29/2017  ? Acute kidney injury superimposed on CKD ([place]) 10/24/2017  ? Sepsis ([place]) 10/24/2017  ? Possible Seizures ([place]) 10/08/2017  ? Hyperlipemia 10/08/2017  ? 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 Stroke-like episode ([place]) s/p IV tpa 10/04/2017  ? Palliative care by specialist   ? Elevated rheumatoid factor 09/05/2017  ? Frequent hospital admissions 08/22/2017  ? Aphasia 06/28/2017  ? Right hand pain 03/21/2017  ? Dry skin 03/21/2017  ? Pain in finger of  left hand 09/12/2016  ? Chronic fatigue 06/12/2016  ? Left knee pain 06/12/2016  ? Goals of care, counseling/discussion 03/13/2016  ? Primary localized osteoarthritis of right knee 02/08/2016  ? OSA (obstructive sleep apnea) 09/16/2015  ? Rotator cuff syndrome 09/07/2015  ? Pulmonary scarring 07/27/2015  ? Sleep disturbance 04/14/2015  ? Coronary artery disease 03/14/2015  ? Polyp of vocal cord 03/14/2015  ? Hypertensive heart disease without congestive heart failure 09/29/2014  ? Acute respiratory failure with hypoxia ([place]) 09/16/2014  ? 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 Anxiety   ? Tobacco abuse   ? Prurigo nodularis   ? GERD (gastroesophageal reflux disease)   ? COPD (chronic obstructive pulmonary disease) ([place])   ? Osteoarthritis   ? Vitamin D deficiency disease   ? Chronic constipation   ? CKD (chronic kidney disease), stage III ([place])   ? Essential hypertension 05/20/2013  ? Morbid obesity ([place]) 05/20/2013  ? Pain in soft tissues of limb 05/20/2013  ? ? ?Past Medical History: ?Past Medical History:  ?Diagnosis Date  ? Angioedema   ? Anxiety and depression   ? CHF (congestive heart failure) ([place])   ? Chronic constipation   ? Chronic kidney disease   ? stage 3  ? COPD (chronic obstructive pulmonary disease) ([place])   ? Diabetes mellitus without complication ([place])   ?  Gallstones   ? GERD (gastroesophageal reflux disease)   ? Hypertension   ? Left ventricular hypertrophy   ? Osteoarthritis   ? Prurigo nodularis   ? Seizures ([place])   ? Stroke [facility])   ? Tobacco abuse   ? Vitamin D deficiency disease   ? ? ?Past Surgical History: ?Past Surgical History:  ?Procedure Laterality Date  ? CHOLECYSTECTOMY    ? POLYPECTOMY  11/2011  ? vocal cord  ? 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  ? Procedure: TOTAL KNEE ARTHROPLASTY;  Surgeon: Hessie Knows, MD;  Location: [facility] ORS;  Service: Orthopedics;  Laterality: Right;  ? ? ?Social History: ?Social History  ? ?Tobacco Use  ? Smoking status: Former  ?  Packs/day: 0.50  ?   Years: 60.00  ?  Pack years: 30.00  ?  Types: Cigarettes  ? Smokeless tobacco: Never  ? Tobacco comments:  ?  quite 72mo ago-03/26/18  ?Vaping Use  ? Vaping Use: Never used  ?Substance Use Topics  ? Alcohol

## 2021-06-18 NOTE — Progress Notes (Signed)
LTM EEG hooked up and running - no initial skin breakdown - push button tested - neuro notified. No [facility] monitoring- patient currently in the ED. ?

## 2021-06-18 NOTE — ED Provider Notes (Signed)
?Katherine [name] ?Provider Note ? ?CSN: [id number] ?Arrival date & time: 06/18/21 5027 ? ?Chief Complaint(s) ?Code Stroke ? ?HPI ?Katherine [name] is a 81 y.o. female who presents the emergency department for evaluation of weakness and aphasia.  Last known well 7 AM.  Apparently, patient has frequent episodes like this lasting between 20 minutes and an hour or 2 where she gradually recovers.  Today, the patient was visited by her husband who witnessed the spell and was concerned that she was having an active stroke and called 911.  Patient does have a seizure history and is currently on multiple antiepileptic medications.  Additional history unable to be obtained as patient is currently experiencing aphasia. ? ? ?Past Medical History ?Past Medical History:  ?Diagnosis Date  ? Angioedema   ? Anxiety and depression   ? CHF (congestive heart failure) ([place])   ? Chronic constipation   ? Chronic kidney disease   ? stage 3  ? COPD (chronic obstructive pulmonary disease) ([place])   ? Diabetes mellitus without complication ([place])   ? Gallstones   ? GERD (gastroesophageal reflux disease)   ? Hypertension   ? Left ventricular hypertrophy   ? Osteoarthritis   ? Prurigo nodularis   ? Seizures ([place])   ? Stroke [facility])   ? Tobacco abuse   ? Vitamin D deficiency disease   ? ?Patient Active Problem List  ? Diagnosis Date Noted  ? Seizure-like activity ([place]) 06/18/2021  ? Acute respiratory failure with hypoxia and hypercapnia ([place]) 04/19/2021  ? Type 2 diabetes mellitus with stage 4 chronic kidney disease ([place]) 12/28/2019  ? Sepsis due to gram-negative UTI ([place]) 06/19/2019  ? Type II diabetes mellitus with renal manifestations ([place]) 06/19/2019  ? Stroke ([place]) 06/19/2019  ? AAA (abdominal aortic aneurysm) ([place]) 06/19/2019  ? Iron deficiency anemia   ? Altered mental status   ? Acute encephalopathy 02/11/2019  ? Sepsis secondary to UTI ([place]) 01/08/2019  ? Acute renal failure superimposed on stage 3b chronic  kidney disease ([place]) 01/08/2019  ? Seizure disorder ([place]) 01/08/2019  ? Diabetes mellitus type 2, controlled, with complications ([place]) 74/01/8785  ? Acute on chronic anemia 01/03/2019  ? COVID-19 12/21/2018  ? COVID-19 virus infection 12/21/2018  ? Hemoptysis 12/20/2018  ? Pulmonary embolism ([place]) 11/21/2018  ? Delirium   ? UTI (urinary tract infection) 08/21/2018  ? Chest pain 07/15/2018  ? Palliative care encounter 05/10/2018  ? Localized edema 05/10/2018  ? Shortness of breath 05/10/2018  ? Hematemesis 04/28/2018  ? Influenza A 04/13/2018  ? Acute on chronic congestive heart failure ([place])   ? COPD with acute exacerbation ([place]) 02/24/2018  ? Chronic diastolic heart failure ([place]) 12/31/2017  ? Lymphedema 12/31/2017  ? COPD exacerbation ([place]) 11/30/2017  ? Urinary tract infection 11/22/2017  ? 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[place]) 11/16/2017  ? Diabetes mellitus type 2, uncomplicated ([place]) 76/72/0947  ? Pressure injury of skin 10/29/2017  ? Acute kidney injury superimposed on CKD ([place]) 10/24/2017  ? Sepsis ([place]) 10/24/2017  ? Possible Seizures ([place]) 10/08/2017  ? Hyperlipemia 10/08/2017  ? 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 Stroke-like episode ([place]) s/p IV tpa 10/04/2017  ? Palliative care by specialist   ? Elevated rheumatoid factor 09/05/2017  ? Frequent hospital admissions 08/22/2017  ? Aphasia 06/28/2017  ? Right hand pain 03/21/2017  ? Dry skin 03/21/2017  ? Pain in finger of left hand 09/12/2016  ? Chronic fatigue 06/12/2016  ? Left knee pain 06/12/2016  ?  Goals of care, counseling/discussion 03/13/2016  ? Primary localized osteoarthritis of right knee 02/08/2016  ? OSA (obstructive sleep apnea) 09/16/2015  ? Rotator cuff syndrome 09/07/2015  ? Pulmonary scarring 07/27/2015  ? Sleep disturbance 04/14/2015  ? Coronary artery disease 03/14/2015  ? Polyp of vocal cord 03/14/2015  ? Hypertensive heart disease without congestive heart failure 09/29/2014  ? Acute respiratory failure with  hypoxia ([place]) 09/16/2014  ? 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 Anxiety   ? Tobacco abuse   ? Prurigo nodularis   ? GERD (gastroesophageal reflux disease)   ? COPD (chronic obstructive pulmonary disease) ([place])   ? Osteoarthritis   ? Vitamin D deficiency disease   ? Chronic constipation   ? CKD (chronic kidney disease), stage III ([place])   ? Essential hypertension 05/20/2013  ? Morbid obesity ([place]) 05/20/2013  ? Pain in soft tissues of limb 05/20/2013  ? ?Home Medication(s) ?Prior to Admission medications   ?Medication Sig Start Date End Date Taking? Authorizing Provider  ?acetaminophen (TYLENOL) 650 MG CR tablet Take 650 mg by mouth 3 (three) times daily.    [provider]  ?albuterol (ACCUNEB) 0.63 MG/3ML nebulizer solution Take 3 mLs by nebulization every 8 (eight) hours as needed for wheezing.     [provider]  ?ammonium lactate (AMLACTIN) 12 % cream Apply 1 application. topically at bedtime.    [provider]  ?cetirizine (ZYRTEC) 10 MG tablet Take 10 mg by mouth daily.     [provider]  ?Fluticasone-Umeclidin-Vilant 100-62.5-25 MCG/INH AEPB Inhale 1 puff into the lungs daily.    [provider]  ?gabapentin (NEURONTIN) 300 MG capsule Take 300 mg by mouth at bedtime.    [provider]  ?ipratropium-albuterol (DUONEB) 0.5-2.5 (3) MG/3ML SOLN Take 3 mLs by nebulization 3 (three) times daily. 07/17/18   Gladstone Lighter, MD  ?lacosamide 100 MG TABS Take 1 tablet (100 mg total) by mouth 2 (two) times daily. 12/31/18   Thurnell Lose, MD  ?levETIRAcetam (KEPPRA) 750 MG tablet Take 1 tablet (750 mg total) by mouth 2 (two) times daily. 12/30/19   Wouk, Ailene Rud, MD  ?lidocaine (LIDODERM) 5 % Place 1 patch onto the skin daily. 01/06/21   [provider]  ?LORazepam (ATIVAN) 2 MG/ML injection Inject 0.5 mg into the vein daily as needed for seizure.    [provider]  ?Menthol, Topical Analgesic, (BIOFREEZE) 4 % GEL Apply 1  application topically in the morning and at bedtime. To both knees     [provider]  ?nitroGLYCERIN (NITROSTAT) 0.4 MG SL tablet Place 0.4 mg under the tongue every 5 (five) minutes as needed for chest pain.  08/07/18   [provider]  ?pantoprazole (PROTONIX) 20 MG tablet Take 20 mg by mouth daily. 04/15/21   [provider]  ?propranolol (INDERAL) 10 MG tablet Take 10 mg by mouth 2 (two) times daily. 04/12/21   [provider]  ?Saline GEL Place 3 sprays into both nostrils 3 (three) times daily.     [provider]  ?sertraline (ZOLOFT) 25 MG tablet Take 25 mg by mouth daily. 12/18/20   [provider]  ?Skin Protectants, Misc. (EUCERIN) cream Apply 1 application. topically at bedtime.    [provider]  ?                                                                                                                                  ?  Past Surgical History ?Past Surgical History:  ?Procedure Laterality Date  ? CHOLECYSTECTOMY    ? POLYPECTOMY  11/2011  ? vocal cord  ? 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  ? Procedure: TOTAL KNEE ARTHROPLASTY;  Surgeon: Hessie Knows, MD;  Location: [facility] ORS;  Service: Orthopedics;  Laterality: Right;  ? ?Family History ?Family History  ?Problem Relation Age of Onset  ? Alcohol abuse Mother   ? Sickle cell anemia Daughter   ? Hypertension Son   ? Cancer Neg Hx   ? COPD Neg Hx   ? Diabetes Neg Hx   ? Heart disease Neg Hx   ? Stroke Neg Hx   ? ? ?Social History ?Social History  ? ?Tobacco Use  ? Smoking status: Former  ?  Packs/day: 0.50  ?  Years: 60.00  ?  Pack years: 30.00  ?  Types: Cigarettes  ? Smokeless tobacco: Never  ? Tobacco comments:  ?  quite 40mo ago-03/26/18  ?Vaping Use  ? Vaping Use: Never used  ?Substance Use Topics  ? Alcohol use: No  ?  Alcohol/week: 0.0 standard drinks  ?  Comment: rare  ? Drug use: No  ? ?Allergies ?Bee venom and Enalapril maleate ? ?Review of Systems ?Review of Systems   ?Neurological:  Positive for speech difficulty and weakness.  ? ?Physical Exam ?Vital Signs  ?I have reviewed the triage vital signs ?BP 127/81   Pulse 80   Temp 97.8 ?F (36.6 ?C) (Oral)   Resp 20   Wt 108 kg   SpO2 97%   BMI

## 2021-06-18 NOTE — Code Documentation (Signed)
Stroke Response Nurse Documentation ?Code Documentation ? ?Drenda Sobecki is a 81 y.o. female arriving to [facility]  via [facility] on 06-18-2021 with past medical hx of HTN, DM, OSA, seizure. On aspirin 325 mg daily. Code stroke was activated by EMS.  ? ?Patient from facility where she was LKW at 0700 and now complaining of right gaze and slurred speech.  Per staff she was her normal self when they gave her meds at 0700.  When her husband arrived she was unresponsive.  She has become more responsive and talkative while with EMS ? ?Stroke team at the bedside on patient arrival. Labs drawn and patient cleared for CT by Dr. Linus Salmons. Patient to CT with team. NIHSS 3, see documentation for details and code stroke times. Patient with disoriented and dysarthria  on exam. The following imaging was completed:  CT Head. Patient is not a candidate for IV Thrombolytic due to stroke not suspected. Patient is not not a candidate for IR due to no LVO suspected.  ? ?Care Plan: VS and mNIHSS q 2hrs.  ? ?Bedside handoff with ED RN April.   ? ?Raliegh Ip  ?Stroke Response RN ?  ?

## 2021-06-18 NOTE — Procedures (Addendum)
Patient Name: Katherine [name]  ?MRN: [id number]  ?Epilepsy Attending: [name]  ?Referring Physician/Provider: Greta Doom, MD ?Date: 06/18/2021 ?Duration: 23.34 mins ? ?Patient history: 81 yo F with recurrent, almost daily, spells of aphasia which are being treated with vimpat and keppra. EEG to evaluate for seizure ? ?Level of alertness: Awake, asleep ? ?AEDs during EEG study: LEV, LCM ? ?Technical aspects: This EEG study was done with scalp electrodes positioned according to the 10-20 International system of electrode placement. Electrical activity was acquired at a sampling rate of 500Hz  and reviewed with a high frequency filter of 70Hz  and a low frequency filter of 1Hz . EEG data were recorded continuously and digitally stored.  ? ?Description: The posterior dominant rhythm consists of 7 Hz activity of moderate voltage (25-35 uV) seen predominantly in posterior head regions, symmetric and reactive to eye opening and eye closing. Sleep was characterized by sleep spindles (12-14hz ), maximal frontocentral region.  EEG showed continuous generalized 5-7 hz theta slowing.  Hyperventilation and photic stimulation were not performed.    ? ?ABNORMALITY ?- Background slow ?- Continuous slow, generalized ? ?IMPRESSION: ?This study is suggestive of mild to moderate diffuse encephalopathy, nonspecific etiology. No seizures or epileptiform discharges were seen throughout the recording. ? ?[name]  ? ?

## 2021-06-18 NOTE — ED Triage Notes (Signed)
Pt to ED via [facility] from [facility] Pt found unresponsive by staff, LKW [id number]. EMS reports pt was unresponsive on arrival and became alert with rt sided gaze, no speech, not following commands, weak on right side. Code Stroke activated PTA. Patient alert on arrival to ED, airway cleared by EDP, transported to CT 2 with neuro team. ? ?EMS v/s: ?124/80 ?76 HR ?93% on 3L baseline ?91 cbg ?98.3  ?

## 2021-06-18 NOTE — Evaluation (Signed)
Clinical/Bedside Swallow Evaluation ?Patient Details  ?Name: Katherine [name] ?MRN: [id number] ?Date of Birth: June 09, 1940 ? ?Today's Date: 06/18/2021 ?Time: SLP Start Time (ACUTE ONLY): 6767 SLP Stop Time (ACUTE ONLY): 2094 ?SLP Time Calculation (min) (ACUTE ONLY): 20 min ? ?Past Medical History:  ?Past Medical History:  ?Diagnosis Date  ? Angioedema   ? Anxiety and depression   ? CHF (congestive heart failure) ([place])   ? Chronic constipation   ? Chronic kidney disease   ? stage 3  ? COPD (chronic obstructive pulmonary disease) ([place])   ? Diabetes mellitus without complication ([place])   ? Gallstones   ? GERD (gastroesophageal reflux disease)   ? Hypertension   ? Left ventricular hypertrophy   ? Osteoarthritis   ? Prurigo nodularis   ? Seizures ([place])   ? Stroke [facility])   ? Tobacco abuse   ? Vitamin D deficiency disease   ? ?Past Surgical History:  ?Past Surgical History:  ?Procedure Laterality Date  ? CHOLECYSTECTOMY    ? POLYPECTOMY  11/2011  ? vocal cord  ? 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  ? Procedure: TOTAL KNEE ARTHROPLASTY;  Surgeon: Hessie Knows, MD;  Location: [facility] ORS;  Service: Orthopedics;  Laterality: Right;  ? ?HPI:  ?81 yo F presented to ED from [facility] after being found unresponsive by staff. Per notes, she presents with recurrent, almost daily, spells of aphasia which are being treated with vimpat and keppra. 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 She was admitted to [facility] in March of this year, swallow was evaluated and a dysphagia 2 diet with thin liquids was recommended.  ?  ?Assessment / Plan / Recommendation  ?Clinical Impression ? Pt poorly alert, but would rouse briefly for participation. Did not follow commands. Lips/tongue cleaned and she accepted ice chips and then sips of water from a straw. Despite keeping eyes closed, she presented with adequate oral attention to spoon/cup  and palpable swallow response with no s/s of aspiration.  She spoke to examiner: " I want a little more of that," then became sleepy again. Recommend continuing NPO for now given her impaired mental status, but allow sips of water and ice chips.  SLP will follow for PO readiness. D/W RN. ?SLP Visit Diagnosis: Dysphagia, unspecified (R13.10) ?   ? ?    ?  ?Diet Recommendation   NPO excepts sips/chips ? ?   ?  ?Other  Recommendations Oral Care Recommendations: Oral care QID;Oral care prior to ice chip/H20   ? ?Recommendations for follow up therapy are one component of a multi-disciplinary discharge planning process, led by the attending physician.  Recommendations may be updated based on patient status, additional functional criteria and insurance authorization. ? ?Follow up Recommendations Other (comment) (tba)  ? ? ?  ?Assistance Recommended at Discharge Frequent or constant Supervision/Assistance  ?Functional Status Assessment    ?Frequency and Duration min 2x/week  ?2 weeks ?  ?   ? ?Prognosis Prognosis for Safe Diet Advancement: Good  ? ?  ? ?Swallow Study   ?General Date of Onset: 06/18/21 ?HPI: 81 yo F presented to ED from [facility] after being found unresponsive by staff. Per notes, she presents with recurrent, almost daily, spells of aphasia which are being treated with vimpat and keppra. PMHx of seizure,  stroke, GERD, HOH, T2DM, stage IIIb CKD, HTN, Obesity, chronic HFpEF, chronic hypoxic respiratory failure on 2L  O2, COPD. She was admitted to [facility] in March of this year, swallow was evaluated and a dysphagia 2 diet with thin liquids was recommended. ?Type of Study: Bedside Swallow Evaluation ?Previous Swallow Assessment: see HPI ?Diet Prior to this Study: NPO ?Temperature Spikes Noted: No ?Respiratory Status: Nasal cannula ?History of Recent Intubation: No ?Behavior/Cognition: Lethargic/Drowsy ?Oral Cavity Assessment: Dried secretions ?Oral Care Completed by SLP: Yes ?Oral Cavity -  Dentition: Edentulous ?Self-Feeding Abilities: Total assist ?Patient Positioning: Upright in bed ?Baseline Vocal Quality: Normal ?Volitional Cough: Cognitively unable to elicit ?Volitional Swallow: Unable to elicit  ?  ?Oral/Motor/Sensory Function Overall Oral Motor/Sensory Function: Other (comment) (symmetric at baseline)   ?Ice Chips Ice chips: Within functional limits   ?Thin Liquid Thin Liquid: Within functional limits  ?  ?Nectar Thick Nectar Thick Liquid: Not tested   ?Honey Thick Honey Thick Liquid: Not tested   ?Puree Puree: Not tested   ?Solid ? ? ?  Solid: Not tested  ? ?  ? ?Katherine [name] ?06/18/2021,2:33 PM ? ?Katherine Littleton L. Lonnie Reth, MA CCC/SLP ?Acute Rehabilitation Services ?Office number [phone] ?Pager [phone] ? ? ? ?

## 2021-06-18 NOTE — Hospital Course (Addendum)
Katherine [name] is a 81 y.o. female with PMH seizures on Keppra and Vimpat, HFpEF (07/2018), HTN, DM2 diet controlled, CKD3B, COPD on 2L [place], stroke, who presented to [facility] (06/18/2021) as code stroke from SN[facility] [phone])) for recurrent aphasia admitted for overnight EEG for seizure, stroke rule out and encephalopathy work-up.  ? ?Was recently hospitalized for hypercarbia and UTI where she was discharged on BiPAP and antibiotics. ? ?Recurrent aphasias of unclear etiology however suspect 2/2 toxic encephalopathy vs delirium ?History of seizures on Vimpat and Keppra ?Initially admitted as a code stroke, with [place] and MRI negative. For further history, aphasia tends to be recurrent, concerning for possible breakthrough seizure, however overnight EEG x2 days that showed no evidence of seizures.  Work-up for encephalopathy was also negative for infectious and metabolic etiologies. TSH 0.552. Did have a UA with leuks, nitrites, bacteria. However given age and lack of fevers, no leukocytosis, baseline mentation, along with no symptoms of UTI, this is likely not per the McGeer criteria for geriatric, did not treat.  ?Home: Vimpat 100 mg twice daily, Keppra 750 mg twice daily ?Home meds per above resumed ?Follow-up with neurology outpatient, neurology has set up, see below ? ?AoCKD3B 2/2 dehydration ?Baseline Cr 1.6. On day of DC Cr (!) 1.50  ?Cr 2 on admission, was responsive to IV LR bolus. ? ?HFpEF (07/2018), HTN ?Dry weight 108 kg.  ?Home: None ? ?DM2 with neuropathy, diet controlled ?A1c 5.6 (06/18/2021).  CBGs 70s-100s, no insulin required.  Gabapentin was held for 2 days, no complaints of pain or withdrawal.  He was to be started at a reduced dose given unclear encephalopathy and AKI. ?Home: Gabapentin 300 mg qHS ?No antidiabetics medication ?Restarted home gabapentin at 200 mg given admission for AMS ? ?Chronic RF on 2L  2/2 COPD and OSA ?Home: BiPAP qHS, Trelegy, DuoNeb ?Continued during  hospitalization, weaned down to 2 L. ? ?MDD, GAD ?Home: Zoloft 25 mg ?Increased Zoloft 50 mg (effective minimum dose) ? ?Other chronic diagnosis: ?GERD on Protonix 20 mg daily ?Thrombocytopenia, PLT 140s-160s stable ?Essential tremors: Propranolol 10 mg twice daily ? ?Discharge follow up recommendations: ?Hematuria on UA: Found to have moderate Hgb on UA, 11-20 RBC on micro. PCP to follow up and repeat. If persistent on recheck, recommend referral to urology for evaluation.  ?Increased zoloft from 25 mg to 50 mg while admitted.  Can continue 50 mg if clinically improved, minimum effective dose. However if still presenting with symptoms, can titrate up to 100 mg, max 200mg  as tolerated and needed.  PCP to follow up and titrate as necessary.  ?Decreased home gabapentin 300 mg nightly to 200 mg given admission of altered mental status and change in speech, with no evidence of stroke or seizure on tests.  This was held during the first 2 days of hospitalization, patient did not complain of pain. ?Seizure history on Vimpat 100 mg twice daily, Keppra 750 mg twice daily, continued original doses.  Neurology scheduled follow-up with outpatient neurology clinic to be evaluated for underlying cognitive disorder or neurodegenerative disease. ?Consider baseline cognitive impairment evaluation for monitoring.  No official diagnosis of dementia ?

## 2021-06-18 NOTE — Progress Notes (Signed)
LTM maint complete - no skin breakdown . Patient has been moved to a different room, study is running on sever.  ?

## 2021-06-18 NOTE — Consult Note (Signed)
Neurology Consultation ?Reason for Consult: difficulty speaking  ?Referring Physician: Kommor, M ? ?CC: Episodes of aphasia ? ?History is obtained from: Nurse at nursing home ? ?HPI: Mozel Burdett is a 81 y.o. female with a history of recurrent episodes of aphasia who presents with another episode.  Per nursing, she does this every day, typically last between 20 minutes and an hour or two.  She gradually recovers and improves.  Today, her husband visited her and caught her during the spell, and was quite concerned which is why 911 was activated.  In the past she has been evaluated for this with concern for seizures and is on vimpat 100mg  BID and keppra 750 mg BID.  ? ? ?LKW: 7am ?tpa given?: no, out of window.  ? ? ?ROS: A 14 point ROS was performed and is negative except as noted in the HPI.  ? ?Past Medical History:  ?Diagnosis Date  ? Angioedema   ? Anxiety and depression   ? CHF (congestive heart failure) ([place])   ? Chronic constipation   ? Chronic kidney disease   ? stage 3  ? COPD (chronic obstructive pulmonary disease) ([place])   ? Diabetes mellitus without complication ([place])   ? Gallstones   ? GERD (gastroesophageal reflux disease)   ? Hypertension   ? Left ventricular hypertrophy   ? Osteoarthritis   ? Prurigo nodularis   ? Seizures ([place])   ? Stroke [facility])   ? Tobacco abuse   ? Vitamin D deficiency disease   ? ? ? ?Family History  ?Problem Relation Age of Onset  ? Alcohol abuse Mother   ? Sickle cell anemia Daughter   ? Hypertension Son   ? Cancer Neg Hx   ? COPD Neg Hx   ? Diabetes Neg Hx   ? Heart disease Neg Hx   ? Stroke Neg Hx   ? ? ? ?Social History:  reports that she has quit smoking. Her smoking use included cigarettes. She has a 30.00 pack-year smoking history. She has never used smokeless tobacco. She reports that she does not drink alcohol and does not use drugs. ? ? ?Exam: ?Current vital signs: ?Wt 108 kg   BMI 40.87 kg/m?  ?Vital signs in last 24 hours: ?Weight:  [108 kg] 108 kg (05/06  0958) ? ? ?Physical Exam  ?Constitutional: Appears well-developed and well-nourished.  ?Psych: Affect appropriate to situation ?Eyes: No scleral injection ?HENT: No OP obstruction ?MSK: no joint deformities.  ?Cardiovascular: Normal rate and regular rhythm.  ?Respiratory: Effort normal, non-labored breathing ?GI: Soft.  No distension. There is no tenderness.  ?Skin: WDI ? ?Neuro: ?Mental Status: ?Patient is awake, alert, very hard of hearing. She is able to follow commands, and able to answer simple questions, though not always accurately. Difficulty with naming.  ?Cranial Nerves: ?II: blinks to threat bilaterally.  ?III,IV, VI: EOMI without ptosis or diploplia.  ?V: Facial sensation is symmetric to temperature ?VII: Facial movement is symmetric.  ?Motor: ?She is able ot hold all extremities against gravity after much encouragement.  ?Sensory: ?She responds to stim x 4.  ?Cerebellar: ?Difficult to get her to cooperate, but no definite ataxia.  ? ? ? ?I have reviewed labs in epic and the results pertinent to this consultation are: ?Glucose 102 ? ?I have reviewed the images obtained:CT head - no changes ? ?Impression: 81 yo F with recurrent, almost daily, spells of aphasia which are being treated with vimpat and keppra. Given the frequency of the events, I thin  kthat admission for monitoring to characterize these spells could be cvery helpful as other spells such as TFNE(Transient focal neurological events associated with amyloid), or simply waxing/waning dementia are also possilbilities.  ? ?Recommendations: ?1) Continue home vimpat and keppra ?2) Overnight EEG ?3) will follow.  ? ? ?Roland Rack, MD ?[facility] ?[phone] ? ?If 7pm- 7am, please page neurology on call as listed in [place]. ? ?

## 2021-06-18 NOTE — ED Notes (Signed)
EEG technician at bedisde.   ?

## 2021-06-19 ENCOUNTER — Encounter (HOSPITAL_COMMUNITY): Payer: Self-pay | Admitting: Student

## 2021-06-19 DIAGNOSIS — G40909 Epilepsy, unspecified, not intractable, without status epilepticus: Principal | ICD-10-CM

## 2021-06-19 DIAGNOSIS — R569 Unspecified convulsions: Secondary | ICD-10-CM | POA: Diagnosis present

## 2021-06-19 DIAGNOSIS — E1159 Type 2 diabetes mellitus with other circulatory complications: Secondary | ICD-10-CM

## 2021-06-19 DIAGNOSIS — E875 Hyperkalemia: Secondary | ICD-10-CM | POA: Diagnosis present

## 2021-06-19 DIAGNOSIS — I152 Hypertension secondary to endocrine disorders: Secondary | ICD-10-CM | POA: Diagnosis present

## 2021-06-19 DIAGNOSIS — E118 Type 2 diabetes mellitus with unspecified complications: Secondary | ICD-10-CM | POA: Diagnosis not present

## 2021-06-19 DIAGNOSIS — I714 Abdominal aortic aneurysm, without rupture, unspecified: Secondary | ICD-10-CM | POA: Diagnosis present

## 2021-06-19 DIAGNOSIS — F09 Unspecified mental disorder due to known physiological condition: Secondary | ICD-10-CM | POA: Diagnosis not present

## 2021-06-19 DIAGNOSIS — Z6841 Body Mass Index (BMI) 40.0 and over, adult: Secondary | ICD-10-CM | POA: Diagnosis not present

## 2021-06-19 DIAGNOSIS — F329 Major depressive disorder, single episode, unspecified: Secondary | ICD-10-CM | POA: Diagnosis present

## 2021-06-19 DIAGNOSIS — F0284 Dementia in other diseases classified elsewhere, unspecified severity, with anxiety: Secondary | ICD-10-CM | POA: Diagnosis present

## 2021-06-19 DIAGNOSIS — I2510 Atherosclerotic heart disease of native coronary artery without angina pectoris: Secondary | ICD-10-CM | POA: Diagnosis present

## 2021-06-19 DIAGNOSIS — E785 Hyperlipidemia, unspecified: Secondary | ICD-10-CM | POA: Diagnosis present

## 2021-06-19 DIAGNOSIS — J431 Panlobular emphysema: Secondary | ICD-10-CM | POA: Diagnosis not present

## 2021-06-19 DIAGNOSIS — E1122 Type 2 diabetes mellitus with diabetic chronic kidney disease: Secondary | ICD-10-CM | POA: Diagnosis present

## 2021-06-19 DIAGNOSIS — N184 Chronic kidney disease, stage 4 (severe): Secondary | ICD-10-CM | POA: Diagnosis present

## 2021-06-19 DIAGNOSIS — R4789 Other speech disturbances: Secondary | ICD-10-CM | POA: Diagnosis not present

## 2021-06-19 DIAGNOSIS — E860 Dehydration: Secondary | ICD-10-CM | POA: Diagnosis present

## 2021-06-19 DIAGNOSIS — F0283 Dementia in other diseases classified elsewhere, unspecified severity, with mood disturbance: Secondary | ICD-10-CM | POA: Diagnosis present

## 2021-06-19 DIAGNOSIS — D696 Thrombocytopenia, unspecified: Secondary | ICD-10-CM | POA: Diagnosis present

## 2021-06-19 DIAGNOSIS — K219 Gastro-esophageal reflux disease without esophagitis: Secondary | ICD-10-CM | POA: Diagnosis present

## 2021-06-19 DIAGNOSIS — J449 Chronic obstructive pulmonary disease, unspecified: Secondary | ICD-10-CM | POA: Diagnosis present

## 2021-06-19 DIAGNOSIS — G4733 Obstructive sleep apnea (adult) (pediatric): Secondary | ICD-10-CM | POA: Diagnosis not present

## 2021-06-19 DIAGNOSIS — E662 Morbid (severe) obesity with alveolar hypoventilation: Secondary | ICD-10-CM | POA: Diagnosis present

## 2021-06-19 DIAGNOSIS — G319 Degenerative disease of nervous system, unspecified: Secondary | ICD-10-CM | POA: Diagnosis present

## 2021-06-19 DIAGNOSIS — R29818 Other symptoms and signs involving the nervous system: Secondary | ICD-10-CM | POA: Diagnosis not present

## 2021-06-19 DIAGNOSIS — R4701 Aphasia: Secondary | ICD-10-CM | POA: Diagnosis present

## 2021-06-19 DIAGNOSIS — Z8616 Personal history of COVID-19: Secondary | ICD-10-CM | POA: Diagnosis not present

## 2021-06-19 DIAGNOSIS — Z794 Long term (current) use of insulin: Secondary | ICD-10-CM

## 2021-06-19 DIAGNOSIS — R319 Hematuria, unspecified: Secondary | ICD-10-CM | POA: Diagnosis present

## 2021-06-19 DIAGNOSIS — D631 Anemia in chronic kidney disease: Secondary | ICD-10-CM | POA: Diagnosis present

## 2021-06-19 DIAGNOSIS — I5032 Chronic diastolic (congestive) heart failure: Secondary | ICD-10-CM | POA: Diagnosis present

## 2021-06-19 DIAGNOSIS — J9611 Chronic respiratory failure with hypoxia: Secondary | ICD-10-CM | POA: Diagnosis present

## 2021-06-19 HISTORY — DX: Degenerative disease of nervous system, unspecified: G31.9

## 2021-06-19 HISTORY — DX: Chronic kidney disease, stage 4 (severe): N18.4

## 2021-06-19 LAB — GLUCOSE, CAPILLARY
Glucose-Capillary: 100 mg/dL — ABNORMAL HIGH (ref 70–99)
Glucose-Capillary: 77 mg/dL (ref 70–99)
Glucose-Capillary: 83 mg/dL (ref 70–99)
Glucose-Capillary: 87 mg/dL (ref 70–99)
Glucose-Capillary: 95 mg/dL (ref 70–99)

## 2021-06-19 LAB — COMPREHENSIVE METABOLIC PANEL
ALT: 8 U/L (ref 0–44)
AST: 11 U/L — ABNORMAL LOW (ref 15–41)
Albumin: 3.4 g/dL — ABNORMAL LOW (ref 3.5–5.0)
Alkaline Phosphatase: 82 U/L (ref 38–126)
Anion gap: 7 (ref 5–15)
BUN: 28 mg/dL — ABNORMAL HIGH (ref 8–23)
CO2: 34 mmol/L — ABNORMAL HIGH (ref 22–32)
Calcium: 9.5 mg/dL (ref 8.9–10.3)
Chloride: 104 mmol/L (ref 98–111)
Creatinine, Ser: 1.83 mg/dL — ABNORMAL HIGH (ref 0.44–1.00)
GFR, Estimated: 28 mL/min — ABNORMAL LOW (ref >60)
Glucose, Bld: 84 mg/dL (ref 70–99)
Potassium: 5.6 mmol/L — ABNORMAL HIGH (ref 3.5–5.1)
Sodium: 145 mmol/L (ref 135–145)
Total Bilirubin: 0.9 mg/dL (ref 0.3–1.2)
Total Protein: 7.2 g/dL (ref 6.5–8.1)

## 2021-06-19 LAB — POTASSIUM: Potassium: 4.6 mmol/L (ref 3.5–5.1)

## 2021-06-19 LAB — CBC
HCT: 41 % (ref 36.0–46.0)
Hemoglobin: 12.4 g/dL (ref 12.0–15.0)
MCH: 30.4 pg (ref 26.0–34.0)
MCHC: 30.2 g/dL (ref 30.0–36.0)
MCV: 100.5 fL — ABNORMAL HIGH (ref 80.0–100.0)
Platelets: 141 10*3/uL — ABNORMAL LOW (ref 150–400)
RBC: 4.08 MIL/uL (ref 3.87–5.11)
RDW: 11.9 % (ref 11.5–15.5)
WBC: 4.5 10*3/uL (ref 4.0–10.5)
nRBC: 0 % (ref 0.0–0.2)

## 2021-06-19 LAB — TSH: TSH: 0.552 u[IU]/mL (ref 0.350–4.500)

## 2021-06-19 MED ORDER — SERTRALINE HCL 25 MG PO TABS
25.0000 mg | ORAL_TABLET | Freq: Every day | ORAL | Status: DC
  Administered 2021-06-20: 25 mg via ORAL
  Filled 2021-06-19: qty 1

## 2021-06-19 MED ORDER — CHLORHEXIDINE GLUCONATE 0.12 % MT SOLN
15.0000 mL | Freq: Two times a day (BID) | OROMUCOSAL | Status: DC
  Administered 2021-06-19 – 2021-06-21 (×5): 15 mL via OROMUCOSAL
  Filled 2021-06-19 (×5): qty 15

## 2021-06-19 NOTE — Progress Notes (Signed)
Subjective: ?She continues to wax/wane in interactiveness.  ? ?Exam: ?Vitals:  ? 06/19/21 1200 06/19/21 1525  ?BP: 90/65 (!) 137/112  ?Pulse: 84 89  ?Resp: (!) 22 20  ?Temp: 97.9 ?F (36.6 ?C) 97.8 ?F (36.6 ?C)  ?SpO2: 96% 98%  ? ?Gen: In bed, NAD ?Resp: non-labored breathing, no acute distress ?Abd: soft, nt ? ?Neuro: ?MS: Awake, notice that she is in the hospital and able to answer simple questions and follow commands. ?CN: Eyes are disconjugate, though she does fixate predominantly with her left eye and track  ?Motor: She gives very poor effort in all four extremities, but is able to follow commands in all four. ?Sensory: She endorses symmetric sensation ? ?Pertinent Labs: ?TSH is normal ? ? ?Impression: 81 year old female with recurrent episodes of decreased interactivity.  It is unclear to me if this is simply waxing/waning throughout the day with drowsiness, transient focal neurological episodes associated with dementia, or something less likely like seizures.  It is also unclear to me if these are the exact same episodes for which she was started on antiepileptic therapy previously, but I suspect that they are. ? ?Family does not seem to be aware that she was having them daily, but per nursing home she has them every single day, and the episode that brought her in yesterday was typical for her, simply witnessed by family which is why she was brought in. ? ?Recommendations: ?1) continue EEG overnight tonight, if no seizures, can discontinue tomorrow. ?2) continue antiepileptics at home dose for now. ?3) consider ABG if she has decreased responsiveness again. ? ?Roland Rack, MD ?[facility] ?[phone] ? ?If 7pm- 7am, please page neurology on call as listed in [place]. ? ?

## 2021-06-19 NOTE — Evaluation (Signed)
Occupational Therapy Evaluation ?Patient Details ?Name: Katherine [name] ?MRN: [id number] ?DOB: 1940/05/31 ?Today's Date: 06/19/2021 ? ? ?History of Present Illness 81 yo F presented to ED from [facility] after being found unresponsive by staff. Per notes, she presents with recurrent, almost daily, spells of aphasia which are being treated with vimpat and keppra. 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  ? ?Clinical Impression ?  ?Pt from SNF, reports using w/c for mobility at baseline and receives assistance for ADLs. Pt currently mod-total A +2 for ADLs, max A +2 for bed mobility and lateral scoot transfer. Pt initially lethargic, increased communication and responsiveness once sitting EOB. Pt inconsistent with answering questions appropriately, requires increased verbal/tactile cueing for single step instruction. Pt presenting with impairments listed below, will follow acutely. Recommend SNF at d/c. ?   ? ?Recommendations for follow up therapy are one component of a multi-disciplinary discharge planning process, led by the attending physician.  Recommendations may be updated based on patient status, additional functional criteria and insurance authorization.  ? ?Follow Up Recommendations ? Skilled nursing-short term rehab (<3 hours/day)  ?  ?Assistance Recommended at Discharge Frequent or constant Supervision/Assistance  ?Patient can return home with the following Two people to help with walking and/or transfers;Two people to help with bathing/dressing/bathroom;Assistance with cooking/housework;Assistance with feeding;Direct supervision/assist for medications management;Direct supervision/assist for financial management;Assist for transportation;Help with stairs or ramp for entrance ? ?  ?Functional Status Assessment ? Patient has had a recent decline in their functional status and demonstrates the  ability to make significant improvements in function in a reasonable and predictable amount of time.  ?Equipment Recommendations ? Other (comment);None recommended by OT (defer to next venue of care)  ?  ?Recommendations for Other Services   ? ? ?  ?Precautions / Restrictions Precautions ?Precautions: Fall ?Restrictions ?Weight Bearing Restrictions: No  ? ?  ? ?Mobility Bed Mobility ?Overal bed mobility: Needs Assistance ?Bed Mobility: Rolling, Supine to Sit, Sit to Supine ?Rolling: Max assist ?  ?Supine to sit: +2 for physical assistance, Total assist ?Sit to supine: +2 for physical assistance, Total assist ?  ?General bed mobility comments: max A +2 - max A for rolling as session progressed ?  ? ?Transfers ?Overall transfer level: Needs assistance ?  ?  ?  ?  ?  ?  ?  ? Lateral/Scoot Transfers: +2 physical assistance, Total assist ?General transfer comment: Simulated lateral scoot transfer, scooting towards pt's R to get closer to [facility] with 2 person total assist ?  ? ?  ?Balance Overall balance assessment: Needs assistance ?Sitting-balance support: Bilateral upper extremity supported, Single extremity supported, No upper extremity supported ?Sitting balance-Leahy Scale: Fair ?Sitting balance - Comments: sits EOB to perform UE/LE ROM without LOB ?  ?  ?  ?  ?  ?  ?  ?  ?  ?  ?  ?  ?  ?  ?  ?   ? ?ADL either performed or assessed with clinical judgement  ? ?ADL Overall ADL's : Needs assistance/impaired ?Eating/Feeding: Sitting;Moderate assistance ?  ?Grooming: Moderate assistance;Sitting ?  ?Upper Body Bathing: Sitting;Standing;Maximal assistance ?  ?Lower Body Bathing: Maximal assistance;Bed level ?  ?Upper Body Dressing : Maximal assistance;Bed level ?Upper Body Dressing Details (indicate cue type and reason): to don gown ?Lower Body Dressing: Maximal assistance;Bed level ?  ?Toilet Transfer: Total assistance;+2 for physical assistance ?  ?Toileting- Water quality scientist  and Hygiene: Bed level;+2 for physical  assistance;Total assistance ?Toileting - Clothing Manipulation Details (indicate cue type and reason): incontinent,for pericare ?  ?  ?Functional mobility during ADLs: Maximal assistance;+2 for physical assistance ?   ? ? ? ?Vision   ?Additional Comments: pt initialy appearing to have L gaze, however able to track to R side more frequenlty as she became more alert. Will further assess  ?   ?Perception   ?  ?Praxis   ?  ? ?Pertinent Vitals/Pain Pain Assessment ?Pain Assessment: No/denies pain  ? ? ? ?Hand Dominance   ?  ?Extremity/Trunk Assessment Upper Extremity Assessment ?Upper Extremity Assessment: Generalized weakness (~90* shoulder AROM bilaterally, weak grasp R>L) ?  ?Lower Extremity Assessment ?Lower Extremity Assessment: Defer to PT evaluation ?  ?Cervical / Trunk Assessment ?Cervical / Trunk Assessment: Other exceptions ?Cervical / Trunk Exceptions: body habitus ?  ?Communication Communication ?Communication: Expressive difficulties (intermittently with aphasic speech during conversation, more alert/interactive once sitting EOB able to reply appropriately to questions asked) ?  ?Cognition Arousal/Alertness: Lethargic ?Behavior During Therapy: Flat affect ?Overall Cognitive Status: No family/caregiver present to determine baseline cognitive functioning ?Area of Impairment: Orientation, Attention, Following commands, Awareness, Problem solving, Safety/judgement, Memory ?  ?  ?  ?  ?  ?  ?  ?  ?Orientation Level: Place, Time, Situation ?  ?  ?Following Commands: Follows one step commands with increased time ?Safety/Judgement: Decreased awareness of safety, Decreased awareness of deficits ?Awareness: Intellectual ?Problem Solving: Slow processing, Difficulty sequencing, Requires verbal cues, Requires tactile cues ?  ?  ?  ?General Comments  VSS on supplemental O2 ? ?  ?Exercises   ?  ?Shoulder Instructions    ? ? ?Home Living Family/patient expects to be discharged to:: Skilled nursing facility ?  ?  ?  ?  ?  ?   ?  ?  ?  ?  ?  ?  ?  ?  ?  ?  ?Additional Comments: pt questionable historian, is w/c level at baseline ?  ? ?  ?Prior Functioning/Environment Prior Level of Function : Needs assist ?  ?  ?  ?Physical Assist : ADLs (physical) ?  ?  ?Mobility Comments: Pt able to report that she gets assist to get up and OOB ?ADLs Comments: Pt able to report that she has assist for ADLs ?  ? ?  ?  ?OT Problem List: Decreased strength;Decreased activity tolerance;Decreased range of motion;Impaired balance (sitting and/or standing);Decreased coordination;Impaired vision/perception;Decreased cognition;Increased edema ?  ?   ?OT Treatment/Interventions: Self-care/ADL training;Therapeutic exercise;DME and/or AE instruction;Therapeutic activities;Patient/family education;Balance training  ?  ?OT Goals(Current goals can be found in the care plan section) Acute Rehab OT Goals ?Patient Stated Goal: none stated ?OT Goal Formulation: With patient ?Time For Goal Achievement: 07/03/21 ?Potential to Achieve Goals: Fair ?ADL Goals ?Pt Will Perform Grooming: with min assist;sitting ?Pt Will Transfer to Toilet: with mod assist;with +2 assist;bedside commode;stand pivot transfer ?Additional ADL Goal #1: Pt will complete bed mobility mod A +2 in prep for ADLs  ?OT Frequency: Min 2X/week ?  ? ?Co-evaluation PT/OT/SLP Co-Evaluation/Treatment: Yes ?Reason for Co-Treatment: Complexity of the patient's impairments (multi-system involvement);For patient/therapist safety;To address functional/ADL transfers ?  ?OT goals addressed during session: ADL's and self-care ?  ? ?  ?AM-PAC OT "6 Clicks" Daily Activity     ?Outcome Measure Help from another person eating meals?: A Little ?Help from another person taking care of personal grooming?: A Lot ?Help from another person toileting, which includes using  toliet, bedpan, or urinal?: Total ?Help from another person bathing (including washing, rinsing, drying)?: Total ?Help from another person to put on and taking  off regular upper body clothing?: A Lot ?Help from another person to put on and taking off regular lower body clothing?: Total ?6 Click Score: 10 ?  ?End of Session Equipment Utilized During Treatment: Oxygen ?

## 2021-06-19 NOTE — Procedures (Addendum)
Patient Name: Katherine [name]  ?MRN: [id number]  ?Epilepsy Attending: [name]  ?Referring Physician/Provider: Greta Doom, MD ?Duration: 06/18/2021 1241 to 06/19/2021 1241 ?  ?Patient history: 81 yo F with recurrent, almost daily, spells of aphasia which are being treated with vimpat and keppra. EEG to evaluate for seizure ?  ?Level of alertness: Awake, asleep ?  ?AEDs during EEG study: LEV, LCM ?  ?Technical aspects: This EEG study was done with scalp electrodes positioned according to the 10-20 International system of electrode placement. Electrical activity was acquired at a sampling rate of 500Hz  and reviewed with a high frequency filter of 70Hz  and a low frequency filter of 1Hz . EEG data were recorded continuously and digitally stored.  ?  ?Description: The posterior dominant rhythm consists of 7 Hz activity of moderate voltage (25-35 uV) seen predominantly in posterior head regions, symmetric and reactive to eye opening and eye closing. Sleep was characterized by sleep spindles (12-14hz ), maximal frontocentral region.  EEG showed continuous generalized 5-7 hz theta slowing at times with triphasic morphology.  Hyperventilation and photic stimulation were not performed.  ?  ?ABNORMALITY ?- Background slow ?- Continuous slow, generalized ?  ?IMPRESSION: ?This study is suggestive of mild to moderate diffuse encephalopathy, nonspecific etiology. No seizures or definite epileptiform discharges were seen throughout the recording. ?  ?[name]  ?

## 2021-06-19 NOTE — Progress Notes (Signed)
LTM maint complete - no skin breakdown  ?Service leads. [facility] monitored, Event button test confirmed by [facility]. ?  Patient  was moved to inpatient room, with EEG cart, staff was contacted that computer wasn't working.   EEG Computer was switched out for another machine after trouble shooting.  ?

## 2021-06-19 NOTE — Evaluation (Signed)
Physical Therapy Evaluation Patient Details Name: Katherine [name] MRN: [id number] DOB: 1940-04-27 Today's Date: 06/19/2021  History of Present Illness  81 yo F presented to ED from [facility] after being found unresponsive by staff. Per notes, she presents with recurrent, almost daily, spells of aphasia which are being treated with vimpat and keppra. 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  Clinical Impression   Pt admitted with above diagnosis. She comes from a SNF, where she gets assist for transfers and ADLs; Reports she has assist getting OOB, and that she enjoys when her husband Fayrene Fearing comes and walks with her (inferring that he pushes her in a wheelchair, but not entirely clear); At the end of session, pt seemed to check in with PT and OT, to ask if we were OK, communicating care for her caregivers; Presents to PT with generalized weakness, functional dependencies, and some difficulty with communication/interaction;  REquired 2 person total assist to roll a nd clean up after incontinent of bowel and bladder; +2 total Assist for getting up to EOB, and laying back down, as well as to perform simulated lateral scooting at EOB; Once in upright sitting, able to participate in exam and therapeutic activities; Seems to be different/declined from her functional baseline, and will benefit fro OT, PT, and ST evaluations and follow-up at SNF; Pt currently with functional limitations due to the deficits listed below (see PT Problem List). Pt will benefit from skilled PT to increase their independence and safety with mobility to allow discharge to the venue listed below.          Recommendations for follow up therapy are one component of a multi-disciplinary discharge planning process, led by the attending physician.  Recommendations may be updated based on patient status, additional functional  criteria and insurance authorization.  Follow Up Recommendations Skilled nursing-short term rehab (<3 hours/day) (Pt is a resident at [facility]; worth considering starting a round of PT/OT/ST services)    Assistance Recommended at Discharge Frequent or constant Supervision/Assistance  Patient can return home with the following       Equipment Recommendations Other (comment) (Can defer to REhab staff at [facility])    Recommendations for Other Services       Functional Status Assessment Patient has had a recent decline in their functional status and demonstrates the ability to make significant improvements in function in a reasonable and predictable amount of time.     Precautions / Restrictions        Mobility  Bed Mobility Overal bed mobility: Needs Assistance Bed Mobility: Rolling, Supine to Sit, Sit to Supine Rolling: Max assist   Supine to sit: +2 for physical assistance, Total assist Sit to supine: +2 for physical assistance, Total assist   General bed mobility comments: Lots of repeated rolling early in session to assist with cleanup and hygeine (incontinent of bowel and bladder upon arrival; With repetition of rolling, pt able to help more; @ person Total assist to clear LEs from EOB and elevate trunk to sit; Total assist to return supine after tiem spent EOB    Transfers Overall transfer level: Needs assistance Equipment used:  (bed pad) Transfers: Bed to chair/wheelchair/BSC (simulated)            Lateral/Scoot Transfers: +2 physical assistance, Total assist General transfer comment: Simulated lateral scoot transfer, scooting towards pt's R to get closer to [facility] with 2 person total  assist    Ambulation/Gait                  Stairs            Wheelchair Mobility    Modified Rankin (Stroke Patients Only)       Balance Overall balance assessment: Needs assistance Sitting-balance support: Bilateral upper extremity supported, Single extremity supported,  No upper extremity supported Sitting balance-Leahy Scale: Fair Sitting balance - Comments: Able to sit EOB in unsupported sitting, and maintain static balance with feet unsupported, with and without UE support                                     Pertinent Vitals/Pain Pain Assessment Pain Assessment: No/denies pain (but initially almost perseverative on feeling cold)    Home Living Family/patient expects to be discharged to:: Skilled nursing facility                   Additional Comments: PT reports she get up with assist; Husband [name] visits often, and they walk together (will need further clarification, but it seems he pushes her in a wheelchair)    Prior Function Prior Level of Function : Needs assist             Mobility Comments: Pt able to report that she gets assist to get up and OOB ADLs Comments: Pt able to report that she has assist for ADLs     Hand Dominance        Extremity/Trunk Assessment   Upper Extremity Assessment Upper Extremity Assessment: Defer to OT evaluation    Lower Extremity Assessment Lower Extremity Assessment: Generalized weakness (Noting bil ankle edema; AROM noted bil ankles, limited dorsiflexion and plantarflexion range; Able to extend knees almost, but not quite full range against gravity; Pt volunteered that she has had surgery on her RLE)    Cervical / Trunk Assessment Cervical / Trunk Assessment: Other exceptions Cervical / Trunk Exceptions: Obese  Communication   Communication: Expressive difficulties;Other (comment) (Initially interactive, and responding to questions with incr time; Once cleaned up and sitting EOB, pt became more interactive, asking PT and OT questions, volunteering information, and answering slightly more complex questions than yes/no)  Cognition   Behavior During Therapy: [facility] for tasks assessed/performed (Initially flat, but more interactive once sitting up) Overall Cognitive Status: No  family/caregiver present to determine baseline cognitive functioning Area of Impairment: Orientation, Attention, Following commands, Awareness, Problem solving, Safety/judgement, Memory                 Orientation Level: Time, Place, Situation (Able to identify "hospital" after given choices for the answer to "hwere are we?")     Following Commands: Follows one step commands with increased time   Awareness: Intellectual Problem Solving: Slow processing, Difficulty sequencing, Requires verbal cues, Requires tactile cues          General Comments General comments (skin integrity, edema, etc.): VSS; session cnducted on supplematnal O2    Exercises     Assessment/Plan    PT Assessment Patient needs continued PT services  PT Problem List Decreased strength;Decreased range of motion;Decreased activity tolerance;Decreased balance;Decreased mobility;Decreased coordination;Decreased cognition;Decreased knowledge of use of DME;Decreased safety awareness;Decreased knowledge of precautions;Obesity       PT Treatment Interventions DME instruction;Functional mobility training;Therapeutic activities;Therapeutic exercise;Gait training;Balance training;Neuromuscular re-education;Cognitive remediation;Patient/family education;Wheelchair mobility training    PT Goals (Current goals can be found  in the Care Plan section)  Acute Rehab PT Goals Patient Stated Goal: Did not specifically state, except that she wanted water, and taht she was cold PT Goal Formulation: Patient unable to participate in goal setting Time For Goal Achievement: 07/03/21 Potential to Achieve Goals: Good    Frequency Min 2X/week     Co-evaluation               AM-PAC PT "6 Clicks" Mobility  Outcome Measure Help needed turning from your back to your side while in a flat bed without using bedrails?: Total Help needed moving from lying on your back to sitting on the side of a flat bed without using bedrails?:  Total Help needed moving to and from a bed to a chair (including a wheelchair)?: Total Help needed standing up from a chair using your arms (e.g., wheelchair or bedside chair)?: Total Help needed to walk in hospital room?: Total Help needed climbing 3-5 steps with a railing? : Total 6 Click Score: 6    End of Session Equipment Utilized During Treatment: Other (comment) (bed pad and clean linen) Activity Tolerance: Patient tolerated treatment well Patient left: in bed;with call bell/phone within reach;with bed alarm set (bed in semi-chair position; Dr. Larita Fife in to assess pt) Nurse Communication: Mobility status PT Visit Diagnosis: Muscle weakness (generalized) (M62.81);Other abnormalities of gait and mobility (R26.89)    Time: 1610-9604 PT Time Calculation (min) (ACUTE ONLY): 38 min   Charges:   PT Evaluation $PT Eval Moderate Complexity: 1 Mod PT Treatments $Therapeutic Activity: 8-22 mins        Van Clines, PT  Acute Rehabilitation Services Office [phone]   Levi Aland 06/19/2021, 10:14 AM

## 2021-06-19 NOTE — Plan of Care (Signed)
?  Problem: Clinical Measurements: ?Goal: Diagnostic test results will improve ?Outcome: Progressing ?  ?Problem: Clinical Measurements: ?Goal: Will remain free from infection ?Outcome: Progressing ?  ?Problem: Skin Integrity: ?Goal: Risk for impaired skin integrity will decrease ?Outcome: Progressing ?  ?

## 2021-06-19 NOTE — Progress Notes (Signed)
FPTS Brief Progress Note ? ?S:Patient sleeping comfortably.  ? ? ?O: ?BP 128/86 (BP Location: Left Arm)   Pulse 80   Temp 98.7 ?F (37.1 ?C) (Oral)   Resp 20   Ht 5\' 4"  (1.626 m)   Wt 107.6 kg   SpO2 98%   BMI 40.72 kg/m?   ?Physical Exam  ?General: female, lying supine in bed, sleeping, visible chest rise. appearing in no acute distress ? ? ? ?A/P: ?- Orders reviewed. Labs for AM ordered, which was adjusted as needed.  ? ? ?Eulis Foster, MD ?06/19/2021, 1:50 AM ?PGY-3, [place] [facility] Night Resident  ?Please page [phone] with questions.  ? ? ?

## 2021-06-19 NOTE — Plan of Care (Signed)

## 2021-06-19 NOTE — Progress Notes (Signed)
Family Medicine Teaching Service ?Daily Progress Note ?Intern Pager: [phone] ? ?Patient name: Katherine [name] Medical record number: [id number] ?Date of birth: 04-Aug-1940 Age: 81 y.o. Gender: female ? ?Primary Care Provider: [facility], [place] ?Consultants: Neuro  ?Code Status: Full, however unable to reach emergency contact Katherine [name] or SN[facility] to confirm ? ?Pt Overview and Major Events to Date:  ?5/6 admitted ? ?Assessment and Plan: ?[name] is a 81 year old female who presents with recurrent episodic aphasia concerning for seizure versus TIA.  PMH includes seizures, HFpEF, HTN, T2DM, CKD 3B, COPD, stroke. ?  ?Recurrent aphasia of unknown etiology ?EEG demonstrates mild to moderate diffuse encephalopathy of nonspecific etiology.  CT head stable without acute infarct or hemorrhage.  Main concern at this time is breakthrough seizure.  We will continue home Keppra and Vimpat.  Dysphagia 1 diet per SLP.  Lab work largely unremarkable, aside from potassium elevation to 5.6 today. ?- PT/OT eval and treat ?- Appreciate neurology recommendations ?- EKG ?- Repeat potassium for confirmation ?- Awaiting UA collection ?  ?AoCKD3B ?Baseline appears to be 1.6.  Improving today s/p bolus yesterday.  No further IV hydration, will encourage p.o. with dysphagia 1 diet.  Recheck creatinine tomorrow. ?  ?Thrombocytopenia ?142 on admission, baseline the last couple of years appears to be closer to 170.  Will monitor. ?  ?HFpEF (07/2018), HTN ?K+ >4, Mg2+ >2, replete as needed.  Patient appears clinically dry on physical exam.  Lungs okay after bolus yesterday.  No output data in chart.  Plan to restart home propranolol 10 mg twice daily once pharmacy tech can confirm with med rec from staff. ?  ?DM2 with neuropathy, diet controlled ?Diet controlled, A1c 5.6.  Inpatient glucose thus far at goal.  Will DC CBG checks and sliding scale insulin.  Has home gabapentin 300 mg daily, continue to hold pending  pharmacy med rec with SNF. ?  ?Chronic respiratory failure on 2L [place] 2/2 COPD and OSA ?Continue BiPAP nightly.  Will restart Trelegy pending pharmacy med rec with SNF. ?  ?GERD ?Believe she takes pantoprazole at home, however we are awaiting pharmacy med rec for SNF. ?  ?MDD, GAD ?Will restart home zoloft 25 mg while awaiting formal med rec. Benefits outweigh risks.  ?  ?GOC ?Sees palliative outpatient for assistance with chronic conditions. At this time, have been unable to reach emergency contact or SNF for code status confirmation and med reconciliation.  We will try again later today as able. ? ?FEN/GI: Dysphagia 1 diet per SLP ?PPx: Lovenox every 24 ?Dispo:SNF pending clinical improvement . Barriers include continued medical work-up, neuro recommendations.  ? ?Subjective:  ?Patient was found awake in bed with PT and OT at bedside.  She appears awake and alert. She is oriented to self, however replies " yes" to all location multiple choices.  Reports no pain or shortness of breath.  Has EEG leads on head, awaiting completion of study. ? ?Objective: ?Temp:  [97.6 ?F (36.4 ?C)-98.7 ?F (37.1 ?C)] 97.6 ?F (36.4 ?C) (05/07 0400) ?Pulse Rate:  [71-84] 84 (05/07 0800) ?Resp:  [17-22] 21 (05/07 0800) ?BP: (112-145)/(66-116) 135/68 (05/07 0800) ?SpO2:  [90 %-100 %] 90 % (05/07 0800) ?Weight:  [107.6 kg] 107.6 kg (05/07 0020) ?Physical Exam: ?General: Awake, alert, oriented to self only ?Cardiovascular: Regular rate and rhythm, no murmurs appreciated ?Respiratory: CTA B ?Abdomen: Obese abdomen, soft, nontender, no tenderness to palpation ?Extremities: Grip strength and BUE strength 4/5 and equal bilaterally (questionably limited  by effort), able to wiggle toes equally bilaterally, however does not raise legs on command ? ?Laboratory: ?Recent Labs  ?Lab 06/18/21 ?0953 06/18/21 ?1115 06/19/21 ?0113  ?WBC 5.0  --  4.5  ?HGB 12.4 13.6 12.4  ?HCT 40.1 40.0 41.0  ?PLT 142*  --  141*  ? ?Recent Labs  ?Lab 06/18/21 ?0953  06/18/21 ?1115 06/19/21 ?0113  ?NA 142 143 145  ?K 5.2* 4.9 5.6*  ?CL 102 101 104  ?CO2 31  --  34*  ?BUN 29* 33* 28*  ?CREATININE 2.01* 2.00* 1.83*  ?CALCIUM 9.3  --  9.5  ?PROT 7.1  --  7.2  ?BILITOT 1.0  --  0.9  ?ALKPHOS 86  --  82  ?ALT 10  --  8  ?AST 15  --  11*  ?GLUCOSE 90 89 84  ? ?Imaging/Diagnostic Tests: ?CT HEAD WITHOUT CONTRAST CODE STROKE 06/18/21 ?IMPRESSION: ?1. Stable CT appearance of the brain with no acute cortically based ?infarct or acute intracranial hemorrhage identified. ASPECTS 10. ?2. #1 was communicated to Dr. Leonel Ramsay at 10:06 am on 06/18/2021 by text page via the [facility] messaging system. ?3. Ongoing chronic right middle ear, mastoid, and some paranasal ?sinus inflammation. ? ?PORTABLE CHEST 1 VIEW 06/18/21 ?IMPRESSION: ?Low volume study with mild basilar atelectasis. ? ?Katherine Essex, MD ?06/19/2021, 11:04 AM ?PGY-2, [place] ?[place] Intern pager: [phone], text pages welcome  ?

## 2021-06-19 NOTE — Progress Notes (Signed)
Speech Language Pathology Treatment: Dysphagia  ?Patient Details ?Name: Katherine [name] ?MRN: [id number] ?DOB: 1940-06-18 ?Today's Date: 06/19/2021 ?Time: 0623-7628 ?SLP Time Calculation (min) (ACUTE ONLY): 25 min ? ?Assessment / Plan / Recommendation ?Clinical Impression ? Patient seen by SLP for skilled treatment session focused on dysphagia goals. Patient awake and alert, had just finished PT/OT session. She is reporting she is hungry and wants some water. SLP set up suction in her room and performed oral care with oral suction, removing small amount of clear-whitish secretions in lateral sulci in oral cavity but no dried or sticky secretions were observed; white coloration on tongue suggestive of oral thrush. After oral care, SLP observed patient with cup and straw sips of water. She exhibited timely swallow initiation and no overt s/s aspiration or penetration. No change in vitals with SpO2 remaining around 95% and RR in range of 21-25. Patient then took spoon bites of puree solids (applesauce) and aside from trace PO residuals on tongue after initial swallows, patient tolerated well. SLP is recommending to initiate Dys 1(puree) solids and thin liquids at this time. 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 SLP will follow for toleration. ? ?  ?HPI HPI: 81 yo F presented to ED from [facility] after being found unresponsive by staff. Per notes, she presents with recurrent, almost daily, spells of aphasia which are being treated with vimpat and keppra. 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 She was admitted to [facility] in March of this year, swallow was evaluated and a dysphagia 2 diet with thin liquids was recommended. ?  ?   ?SLP Plan ? Continue with current plan of care ? ?  ?  ?Recommendations for follow up therapy are one component of a  multi-disciplinary discharge planning process, led by the attending physician.  Recommendations may be updated based on patient status, additional functional criteria and insurance authorization. ?  ? ?Recommendations  ?Diet recommendations: Dysphagia 1 (puree);Thin liquid ?Liquids provided via: Cup;Straw ?Medication Administration: Whole meds with puree ?Supervision: Patient able to self feed;Full supervision/cueing for compensatory strategies;Staff to assist with self feeding ?Compensations: Minimize environmental distractions;Slow rate;Small sips/bites ?Postural Changes and/or Swallow Maneuvers: Seated upright 90 degrees  ?   ?    ?   ? ? ? ? Oral Care Recommendations: Oral care BID;Staff/trained caregiver to provide oral care ?Follow Up Recommendations: Skilled nursing-short term rehab (<3 hours/day) ?Assistance recommended at discharge: Frequent or constant Supervision/Assistance ?SLP Visit Diagnosis: Dysphagia, oral phase (R13.11) ?Plan: Continue with current plan of care ? ? ? ? ?  ?  ? ? ?Sonia Baller, MA, CCC-SLP ?Speech Therapy ? ?

## 2021-06-19 NOTE — Progress Notes (Signed)
This morning's potassium was elevated to 5.6. Ordered 0830 recheck, not drawn yet. Attempted to reach phlebotomy without success. Tried 259-1028, -Katy, -0997, and -[place].People. Will try again as able.  ? ?Ezequiel Essex, MD ? ?

## 2021-06-19 NOTE — Progress Notes (Addendum)
11 AM: Attempted to call husband, listed as emergency contact Blanchie Serve at [phone]. Phone rang busy. No option for voicemail. Will try again later.  ? ?Tried to call [facility] at [phone] to confirm code status and check in on med list (pharm tech awaiting fax to finish med rec). Line was supposedly transferred to nurse, but rang without answer of voicemail for several minutes. Given more pressing patient care matters, I hung up and will try again later as I am able.   ? ?Ezequiel Essex, MD ? ? ?Addendum ?Tried again around 2 PM to call emergency contact, phone line rang busy again.  No option for voicemail.  Again called SNF.  This time, was able to get in contact with nurse who confirmed she is indeed full code.  Also provided ED phone number to call and obtain best fax number for the pharmacy tech so they may fax over a med list.  ? ?Ezequiel Essex, MD ?2:10pm ? ?

## 2021-06-19 NOTE — Progress Notes (Signed)
EEG maint complete.  ?

## 2021-06-20 DIAGNOSIS — G40909 Epilepsy, unspecified, not intractable, without status epilepticus: Secondary | ICD-10-CM

## 2021-06-20 DIAGNOSIS — R4789 Other speech disturbances: Secondary | ICD-10-CM

## 2021-06-20 DIAGNOSIS — F09 Unspecified mental disorder due to known physiological condition: Secondary | ICD-10-CM

## 2021-06-20 DIAGNOSIS — R569 Unspecified convulsions: Secondary | ICD-10-CM | POA: Diagnosis not present

## 2021-06-20 LAB — CBC
HCT: 37.9 % (ref 36.0–46.0)
Hemoglobin: 11.6 g/dL — ABNORMAL LOW (ref 12.0–15.0)
MCH: 30.4 pg (ref 26.0–34.0)
MCHC: 30.6 g/dL (ref 30.0–36.0)
MCV: 99.5 fL (ref 80.0–100.0)
Platelets: 135 10*3/uL — ABNORMAL LOW (ref 150–400)
RBC: 3.81 MIL/uL — ABNORMAL LOW (ref 3.87–5.11)
RDW: 12.1 % (ref 11.5–15.5)
WBC: 5.3 10*3/uL (ref 4.0–10.5)
nRBC: 0 % (ref 0.0–0.2)

## 2021-06-20 LAB — URINALYSIS, ROUTINE W REFLEX MICROSCOPIC
Bilirubin Urine: NEGATIVE
Glucose, UA: NEGATIVE mg/dL
Ketones, ur: 5 mg/dL — AB
Nitrite: POSITIVE — AB
Protein, ur: NEGATIVE mg/dL
Specific Gravity, Urine: 1.01 (ref 1.005–1.030)
WBC, UA: >50 WBC/hpf — ABNORMAL HIGH (ref 0–5)
pH: 5 (ref 5.0–8.0)

## 2021-06-20 LAB — BASIC METABOLIC PANEL
Anion gap: 7 (ref 5–15)
BUN: 23 mg/dL (ref 8–23)
CO2: 34 mmol/L — ABNORMAL HIGH (ref 22–32)
Calcium: 9.2 mg/dL (ref 8.9–10.3)
Chloride: 104 mmol/L (ref 98–111)
Creatinine, Ser: 1.65 mg/dL — ABNORMAL HIGH (ref 0.44–1.00)
GFR, Estimated: 31 mL/min — ABNORMAL LOW (ref >60)
Glucose, Bld: 78 mg/dL (ref 70–99)
Potassium: 4.5 mmol/L (ref 3.5–5.1)
Sodium: 145 mmol/L (ref 135–145)

## 2021-06-20 LAB — GLUCOSE, CAPILLARY
Glucose-Capillary: 79 mg/dL (ref 70–99)
Glucose-Capillary: 97 mg/dL (ref 70–99)
Glucose-Capillary: 99 mg/dL (ref 70–99)

## 2021-06-20 MED ORDER — LACOSAMIDE 50 MG PO TABS
100.0000 mg | ORAL_TABLET | Freq: Two times a day (BID) | ORAL | Status: DC
Start: 2021-06-20 — End: 2021-06-22
  Administered 2021-06-20 – 2021-06-21 (×3): 100 mg via ORAL
  Filled 2021-06-20 (×3): qty 2

## 2021-06-20 MED ORDER — FLUTICASONE FUROATE-VILANTEROL 100-25 MCG/ACT IN AEPB
1.0000 | INHALATION_SPRAY | Freq: Every day | RESPIRATORY_TRACT | Status: DC
  Filled 2021-06-20: qty 28

## 2021-06-20 MED ORDER — FLUTICASONE-UMECLIDIN-VILANT 100-62.5-25 MCG/INH IN AEPB: 1.0000 | INHALATION_SPRAY | Freq: Every morning | RESPIRATORY_TRACT | Status: DC

## 2021-06-20 MED ORDER — LEVETIRACETAM 750 MG PO TABS
750.0000 mg | ORAL_TABLET | Freq: Two times a day (BID) | ORAL | Status: DC
  Administered 2021-06-20 – 2021-06-21 (×2): 750 mg via ORAL
  Filled 2021-06-20 (×4): qty 1

## 2021-06-20 MED ORDER — UMECLIDINIUM BROMIDE 62.5 MCG/ACT IN AEPB
1.0000 | INHALATION_SPRAY | Freq: Every day | RESPIRATORY_TRACT | Status: DC
  Filled 2021-06-20: qty 7

## 2021-06-20 MED ORDER — IPRATROPIUM-ALBUTEROL 0.5-2.5 (3) MG/3ML IN SOLN
3.0000 mL | Freq: Three times a day (TID) | RESPIRATORY_TRACT | Status: DC
  Administered 2021-06-20 – 2021-06-21 (×2): 3 mL via RESPIRATORY_TRACT
  Filled 2021-06-20 (×2): qty 3

## 2021-06-20 MED ORDER — PROPRANOLOL HCL 10 MG PO TABS
10.0000 mg | ORAL_TABLET | Freq: Two times a day (BID) | ORAL | Status: DC
  Administered 2021-06-20 – 2021-06-21 (×3): 10 mg via ORAL
  Filled 2021-06-20 (×5): qty 1

## 2021-06-20 MED ORDER — LIDOCAINE 5 % EX PTCH
1.0000 | MEDICATED_PATCH | Freq: Every day | CUTANEOUS | Status: DC
  Administered 2021-06-20 – 2021-06-21 (×2): 1 via TRANSDERMAL
  Filled 2021-06-20 (×2): qty 1

## 2021-06-20 MED ORDER — ACETAMINOPHEN 325 MG PO TABS: 650.0000 mg | ORAL_TABLET | Freq: Three times a day (TID) | ORAL | Status: DC | PRN

## 2021-06-20 MED ORDER — PANTOPRAZOLE SODIUM 20 MG PO TBEC
20.0000 mg | DELAYED_RELEASE_TABLET | Freq: Every day | ORAL | Status: DC
  Administered 2021-06-20 – 2021-06-21 (×2): 20 mg via ORAL
  Filled 2021-06-20 (×2): qty 1

## 2021-06-20 MED ORDER — HYDROCERIN EX CREA
1.0000 "application " | TOPICAL_CREAM | Freq: Every day | CUTANEOUS | Status: DC
  Administered 2021-06-20 – 2021-06-21 (×2): 1 via TOPICAL
  Filled 2021-06-20: qty 113

## 2021-06-20 MED ORDER — GABAPENTIN 100 MG PO CAPS
200.0000 mg | ORAL_CAPSULE | Freq: Every day | ORAL | Status: DC
  Administered 2021-06-20 – 2021-06-21 (×2): 200 mg via ORAL
  Filled 2021-06-20 (×2): qty 2

## 2021-06-20 MED ORDER — SERTRALINE HCL 50 MG PO TABS
50.0000 mg | ORAL_TABLET | Freq: Every day | ORAL | Status: DC
  Administered 2021-06-21: 50 mg via ORAL
  Filled 2021-06-20: qty 1

## 2021-06-20 NOTE — Plan of Care (Signed)

## 2021-06-20 NOTE — Progress Notes (Signed)
NEUROLOGY CONSULTATION PROGRESS NOTE  ? ?Date of service: Jun 20, 2021 ?Patient Name: Katherine [name] ?MRN:  [id number] ?DOB:  06-05-1940 ? ?Brief HPI  ?Georgi Tuel is a 81 y.o. female with PMH significant for HTN, COPD, T2DM, CKD, recurrent aphasia who presents from a skilled nursing facility with history of daily transient neurologic episodes and presented with RUE weakness and aphasia on vimpat and keppra. On presentation there were no focal neurologic abnormalities noted. CT wo was performed without evidence of acute intracranial process. EEG showed mild to moderate diffuse encephalopathy.  ?  ?Interval Hx  ? ?Patient resting comfortably in bed. She is alert and oriented to person and place but believes it is August. She remains disoriented despite attempts to correct her. Although she denies any new complaints today, she has very poor insight into her PMH and events preceding this hospitalization. She has a difficult time participating with the interview stating that I am not from [place] and she can't understand me. She repeatedly states that she is here because people think she is crazy, but wouldn't not clarify. She denies any difficulties with sleeping. She endorses vivid dreams but is unable to elaborate. She states that she is happy w/o history of depression or anxiety. She denies visual or auditory hallucinations. Consistently has difficulty with more complex questioning and will frequently answer questions with inappropriate response.  ? ?I spoke with the SNF who state that at baseline she is a full assist with ADLs with the exception of feeding herself. She is able to communicate with staff. She occasionally will stand with PT but otherwise does not ambulate. She is frequently incontinent.  ? ? ?Vitals  ? ?Vitals:  ? 06/19/21 1525 06/20/21 0006 06/20/21 0400 06/20/21 0749  ?BP: (!) 137/112 137/77 121/66 118/66  ?Pulse: 89 82 80 84  ?Resp: 20 20 (!) 23 20  ?Temp: 97.8 ?F (36.6 ?C) 98.4 ?F (36.9 ?C)   97.6 ?F (36.4 ?C)  ?TempSrc: Oral Axillary  Axillary  ?SpO2: 98% 97% 97% 98%  ?Weight:      ?Height:      ?  ? ?Body mass index is 40.72 kg/m?. ? ?Physical Exam  ? ?General: Laying comfortably in bed; in no acute distress.  ?HENT: Normal oropharynx and mucosa. Normal external appearance of ears and nose.  ?Neck: Supple, no pain or tenderness  ?CV: No JVD. No peripheral edema.  ?Pulmonary: Symmetric Chest rise. Normal respiratory effort.  ?Abdomen: Soft to touch, non-tender.  ?Ext: No cyanosis, edema, or deformity  ?Skin: No rash. Normal palpation of skin.   ?Musculoskeletal: Normal digits and nails by inspection. No clubbing.  ? ?Neurologic Examination  ?Mental status/Cognition: Alert, oriented to self, place, good attention.  ?Speech/language: is relatively fluent but has difficulty comprehending minimal to moderately complicated questions, object naming intact, she is unable to repeat "no ifs ands or buts. " ? ?Cranial nerves:  ? CN II Pupils equal and reactive to light, no VF deficits   ? CN III,IV,VI EOM intact, no gaze preference or deviation, no nystagmus   ? CN V normal sensation in V1, V2, and V3 segments bilaterally   ? CN VII no asymmetry, no nasolabial fold flattening   ? CN VIII normal hearing to speech   ? CN IX & X normal palatal elevation, no uvular deviation   ? CN XI 5/5 head turn and 5/5 shoulder shrug bilaterally   ? CN XII midline tongue protrusion   ? ?Motor:  ?Muscle bulk: normal, tone normal, pronator  drift none, tremor none ?Patient has generalized weakness form prolonged immobilization but no focal deficits bilaterally.  ? ?Reflexes: ?Intact upper and lower extremity reflexes.  ? ?Sensation: ? Light touch Grossly normal bilaterally.   ? Pin prick   ? Temperature   ? Vibration   ?Proprioception   ? ?Coordination/Complex Motor:  ?- Finger to Nose intact ? ? ?Labs  ? ?Basic Metabolic Panel:  ?Lab Results  ?Component Value Date  ? NA 145 06/20/2021  ? K 4.5 06/20/2021  ? CO2 34 (H) 06/20/2021  ?  GLUCOSE 78 06/20/2021  ? BUN 23 06/20/2021  ? CREATININE 1.65 (H) 06/20/2021  ? CALCIUM 9.2 06/20/2021  ? GFRNONAA 31 (L) 06/20/2021  ? GFRAA 45 (L) 09/27/2019  ? ?HbA1c:  ?Lab Results  ?Component Value Date  ? HGBA1C 5.6 06/18/2021  ? ?LDL:  ?Lab Results  ?Component Value Date  ? [place] 43 02/12/2019  ? ?Urine Drug Screen:  ?   ?Component Value Date/Time  ? [place] DETECTED 06/10/2018 1320  ? [place] DETECTED 06/10/2018 1320  ? LABBENZ NONE DETECTED 06/10/2018 1320  ? AMPHETMU NONE DETECTED 06/10/2018 1320  ? THCU NONE DETECTED 06/10/2018 1320  ? LABBARB NONE DETECTED 06/10/2018 1320  ?  ?Alcohol Level  ?   ?Component Value Date/Time  ? ETH <10 09/06/2018 1358  ? ?Lab Results  ?Component Value Date  ? LEVETIRACETA 1.0 (L) 03/02/2019  ? ?No results found for: PHENYTOIN, PHENOBARB, VALPROATE, CBMZ ? ?Imaging and Diagnostic studies  ?Results for orders placed during the hospital encounter of 06/18/21 ? ?CT HEAD CODE STROKE WO CONTRAST ? ?Narrative ?CLINICAL DATA:  Code stroke.  81 year old female ? ?EXAM: ?CT HEAD WITHOUT CONTRAST ? ?TECHNIQUE: ?Contiguous axial images were obtained from the base of the skull ?through the vertex without intravenous contrast. ? ?RADIATION DOSE REDUCTION: This exam was performed according to the ?departmental dose-optimization program which includes automated ?exposure control, adjustment of the mA and/or kV according to ?patient size and/or use of iterative reconstruction technique. ? ?COMPARISON:  Brain MRI and head CT 04/19/2021. ? ?FINDINGS: ?Brain: Unchanged cerebral volume. No midline shift, ?ventriculomegaly, mass effect, evidence of mass lesion, intracranial ?hemorrhage or evidence of cortically based acute infarction. Stable ?gray-white matter differentiation throughout the brain. Chronic mild ?heterogeneity in the right caudate nucleus. ? ?Vascular: Calcified atherosclerosis at the skull base. No suspicious ?intracranial vascular hyperdensity. ? ?Skull: No  acute osseous abnormality identified. ? ?Sinuses/Orbits: Ongoing right middle ear and mastoid opacification. ?Mild left mastoid effusion not significantly changed. Fluid level in ?the left sphenoid sinus not significantly changed. Maxillary ?mucoperiosteal thickening is stable. ? ?Other: Disconjugate gaze similar to March. No acute orbit or scalp ?soft tissue finding. ? ?ASPECTS [facility] Stroke Program Early CT Score) ? ?Total score (0-10 with 10 being normal): 10 ? ?IMPRESSION: ?1. Stable CT appearance of the brain with no acute cortically based ?infarct or acute intracranial hemorrhage identified. ASPECTS 10. ?2. #1 was communicated to Dr. Leonel Ramsay at 10:06 am on 06/18/2021 by ?text page via the [facility] messaging system. ?3. Ongoing chronic right middle ear, mastoid, and some paranasal ?sinus inflammation. ? ? ?Electronically Signed ?By: Genevie Ann M.D. ?On: 06/18/2021 10:07 ? ? ?Impression  ? ?Chosen Garron is a 81 y.o. female with PMH significant for HTN, COPD, T2DM, CKD, recurrent aphasia. Her neurologic examination is notable for cognitive abnormalities without focal deficits. ? ?Recurrent aphasia: ?Patient presents with a history of aphasic episodes believed to be 2/2 to seizures. She is currently on AED medication  with breakthrough symptoms. EEG monitoring has been negative her in the hospital and therefore, difficult to determine if these events are due to true seizures. She does have some underlying cognitive impairment with poor insight into her current illness. However, she does not present with significant signs of of lewy body dementia. She does have moderate microvascular disease on MRI, which would be contributing to some vascular dementia. However this does not explain the aphasic episodes.  ? ?Recommendations  ?Recurrent aphasia: ?- Discontinue EEG ?- Cont home does of AED ?- Will likely need to follow up with OP neurology.   ?______________________________________________________________________ ? ? ?Thank you for the opportunity to take part in the care of this patient. If you have any further questions, please contact the neurology consultation attending. ? ?Signed, ? ? ?Lawerance Cruel, D.O.  ?Inte

## 2021-06-20 NOTE — Progress Notes (Signed)
Family Medicine Teaching Service ?Daily Progress Note ?Intern Pager: [phone] ? ?Patient name: Katherine [name] Medical record number: [id number] ?Date of birth: 10-25-1940 Age: 81 y.o. Gender: female ? ?Primary Care Provider: [facility], [place] ?Consultants: Neuro  ?Code Status: Full Code, confirmed with SNF [facility] [phone]  ? ?Pt Overview and Major Events to Date:  ?5/6 admitted ? ? ?Assessment and Plan: ?[name] is a 81 year old female who presents with recurrent episodic aphasia concerning for seizure versus TIA. PMH includes seizures, HFpEF, HTN, T2DM, CKD 3B, COPD, stroke. ?  ?Recurrent aphasia of unknown etiology, h/o seizures ?CT head negative for acute bleed or mass. MRI  Main concern is possible breakthrough seizures while on home antiseizures, however 2 days of continuous EEG overnight showed no seizure activities.  Encephalopathy work-up thus far:  No electrolyte derangements.  CBGs within 70s-100s, no hypoglycemic symptoms.  Potassium is within normal limits today.  UA returned with leuks, nitrite positive, bacteria, consistent with UTI. Patient denied symptoms, however was difficult given likely baseline dementia.  ?- PT/OT eval and treat ?- Appreciate neurology recommendations ?-- Transitioned back to PO home keppra and vimpat ? ? ?AoCKD3B, much improved ?Baseline appears to be 1.6.  Improving today s/p bolus yesterday.  No further IV hydration,  ?Cr (!) 1.65  ?  ?Thrombocytopenia, stable ?142 on admission, baseline the last couple of years appears to be closer to 170.  ? ?HFpEF (07/2018) ?HTN, stable ?K+ >4, Mg2+ >2, replete as needed. UOP 173mL.  Baseline O2 requirement 2 L [place] ?-- med rec ?-- Plan to restart home propranolol 10 mg twice daily once pharmacy tech can confirm with med rec from staff. ?--Wean O2 as tolerated ?  ?DM2 with neuropathy, diet controlled ?A1c 5.6 (06/18/2021). Has home gabapentin 300 mg daily, continue to hold pending pharmacy med rec with SNF. No  hyperglycemia ?-- No antidiabetics currently ?-- CBGs, no SSI ? ?Chronic respiratory failure on 2L [place] 2/2 COPD and OSA ?At baseline on 2 L nasal cannula, confirmed with SNF [facility] [phone]. Will restart Trelegy pending pharmacy med rec with SNF. SpO2 98 % 3.5L [place] ?-- wean o2 to home O2 - 2 L nasal cannula ?-- restart home meds ?-- BiPAP qHS ? ?GERD ?Believe she takes pantoprazole at home, however we are awaiting pharmacy med rec for SNF. ?--Restart home med pending med rec ?  ?MDD, GAD ?Will restart home zoloft 25 mg while awaiting formal med rec. Benefits outweigh risks.  ?-- inc zoloft 25mg  to 50 mg ?  ? ?FEN/GI: Dysphagia 3 diet per SLP ?PPx: Enoxaparin (lovenox) injection 30 mg start: 06/18/21 1345  ? ?Dispo: Skilled nursing-short term rehab (<3 hours/day) (Pt is a resident at [facility]; worth considering starting a round of PT/OT/ST services), No SLP follow up  ?DME: Other (comment) (Can defer to REhab staff at [facility]) (06/19/21) ?Barrier: Returning to home O2 and neuro recommendations.   ? ?Subjective:  ?ON: No acute events, no seizures seen on continuous EEG. ? ?Patient seen alert, awake, engaged with evaluation.  She has no concerns or complaints.  Denied pain, difficulty urinating, dysuria, frequency. ? ?Objective: ?Temp:  [97.6 ?F (36.4 ?C)-98.4 ?F (36.9 ?C)] 98.1 ?F (36.7 ?C) (05/08 1200) ?Pulse Rate:  [80-89] 87 (05/08 1200) ?Resp:  [20-23] 21 (05/08 1200) ?BP: (118-137)/(66-112) 125/78 (05/08 1200) ?SpO2:  [97 %-98 %] 98 % (05/08 1200) ? ?MSE ?Alert, awake, engaged in evaluation.  Oriented to self only.  Declined to answer other orientation  questions. ?Speech was clear, coherent, no dysarthria ?Impaired attention, unable to count backwards from 10-1 ?Impaired repetition. ?Naming of common and less common items intact. ?Comprehension intact, can complete three-point instructions without difficulty ? ?Neuro ?No focal deficits ? ?General: In no acute distress ?Cardiovascular: Regular rate  and rhythm, no murmurs appreciated ?Respiratory: CTA B, normal work of breathing, no wheezing or crackles appreciated-although difficult due to habitus ?Abdomen: Obese abdomen, soft, nontender, no tenderness to palpation ? ?Laboratory: ?Recent Labs  ?Lab 06/18/21 ?0953 06/18/21 ?1115 06/19/21 ?0113 06/20/21 ?4193  ?WBC 5.0  --  4.5 5.3  ?HGB 12.4 13.6 12.4 11.6*  ?HCT 40.1 40.0 41.0 37.9  ?PLT 142*  --  141* 135*  ? ?Recent Labs  ?Lab 06/18/21 ?0953 06/18/21 ?1115 06/19/21 ?0113 06/19/21 ?1048 06/20/21 ?7902  ?NA 142 143 145  --  145  ?K 5.2* 4.9 5.6* 4.6 4.5  ?CL 102 101 104  --  104  ?CO2 31  --  34*  --  34*  ?BUN 29* 33* 28*  --  23  ?CREATININE 2.01* 2.00* 1.83*  --  1.65*  ?CALCIUM 9.3  --  9.5  --  9.2  ?PROT 7.1  --  7.2  --   --   ?BILITOT 1.0  --  0.9  --   --   ?ALKPHOS 86  --  82  --   --   ?ALT 10  --  8  --   --   ?AST 15  --  11*  --   --   ?GLUCOSE 90 89 84  --  78  ? ?Imaging/Diagnostic Tests: ?CT HEAD WITHOUT CONTRAST CODE STROKE 06/18/21 ?IMPRESSION: ?1. Stable CT appearance of the brain with no acute cortically based ?infarct or acute intracranial hemorrhage identified. ASPECTS 10. ?2. #1 was communicated to Dr. Leonel Ramsay at 10:06 am on 06/18/2021 by text page via the [facility] messaging system. ?3. Ongoing chronic right middle ear, mastoid, and some paranasal ?sinus inflammation. ? ?PORTABLE CHEST 1 VIEW 06/18/21 ?IMPRESSION: ?Low volume study with mild basilar atelectasis. ? ?MRI neg for acute stroke ? ?Merrily Brittle, DO ?06/20/2021, 2:26 PM ?PGY-1, [place] ?[place] Intern pager: [phone], text pages welcome  ?

## 2021-06-20 NOTE — Plan of Care (Signed)
?  Problem: Coping: ?Goal: Ability to adjust to condition or change in health will improve ?Outcome: Not Progressing ?  ?Problem: Medication: ?Goal: Risk for medication side effects will decrease ?Outcome: Not Progressing ?  ?

## 2021-06-20 NOTE — [place] FL2 (Signed)
?[place] MEDICAID FL2 LEVEL OF CARE SCREENING TOOL  ?  ? ?IDENTIFICATION  ?Patient Name: ?Katherine [name] Birthdate: [birth year] Sex: female Admission Date (Current Location): ?06/18/2021  ?[place] and [place] Number: ? [place] ?  Facility and Address:  ?[facility], [place] [address], [place], [place] [address] ?     Provider Number: ?[id number]  ?Attending Physician Name and Address:  ?McDiarmid, Blane Ohara, MD ? Relative Name and Phone Number:  ?  ?   ?Current Level of Care: ?Hospital Recommended Level of Care: ?[place] Prior Approval Number: ?  ? ?Date Approved/Denied: ?  PASRR Number: ?[id number] A ? ?Discharge Plan: ?SNF ?  ? ?Current Diagnoses: ?Patient Active Problem List  ? Diagnosis Date Noted  ? Chronic kidney disease, stage 4 (severe) ([place]) 06/19/2021  ? Cerebral atrophy ([place]) 06/19/2021  ? Seizure-like activity ([place]) 06/18/2021  ? Type 2 diabetes mellitus with stage 4 chronic kidney disease ([place]) 12/28/2019  ? AAA (abdominal aortic aneurysm) ([place]) 06/19/2019  ? Iron deficiency anemia   ? Seizure disorder ([place]) 01/08/2019  ? Diabetes mellitus type 2, controlled, with complications ([place]) 16/60/6301  ? History of pulmonary embolism 11/21/2018  ? Chronic diastolic heart failure ([place]) 12/31/2017  ? Lymphedema 12/31/2017  ? 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[place]) 11/16/2017  ? Hyperlipidemia associated with type 2 diabetes mellitus ([place]) 10/08/2017  ? 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 Frequent hospital admissions 08/22/2017  ? Dry skin 03/21/2017  ? Chronic fatigue 06/12/2016  ? Goals of care, counseling/discussion 03/13/2016  ? Primary localized osteoarthritis of right knee 02/08/2016  ? OSA (obstructive sleep apnea) 09/16/2015  ? Rotator cuff syndrome 09/07/2015  ? Pulmonary scarring 07/27/2015  ? Sleep disturbance 04/14/2015  ? Coronary artery disease 03/14/2015  ? Polyp of vocal cord 03/14/2015  ? Hypertensive heart disease without congestive  heart failure 09/29/2014  ? 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 Anxiety   ? Tobacco abuse   ? Prurigo nodularis   ? GERD (gastroesophageal reflux disease)   ? COPD (chronic obstructive pulmonary disease) ([place])   ? Osteoarthritis   ? Vitamin D deficiency disease   ? Chronic constipation   ? Hypertension associated with diabetes ([place]) 05/20/2013  ? ? ?Orientation RESPIRATION BLADDER Height & Weight   ?  ?Self, Place ? O2 (3.5L nasal cannula) Incontinent, External catheter Weight: 237 lb 3.4 oz (107.6 kg) ?Height:  5\' 4"  (162.6 cm)  ?BEHAVIORAL SYMPTOMS/MOOD NEUROLOGICAL BOWEL NUTRITION STATUS  ?    Continent Diet (See dc summary)  ?AMBULATORY STATUS COMMUNICATION OF NEEDS Skin   ?Extensive Assist Verbally Normal ?  ?  ?  ?    ?     ?     ? ? ?Personal Care Assistance Level of Assistance  ?Bathing, Feeding, Dressing Bathing Assistance: Maximum assistance ?Feeding assistance: Limited assistance ?Dressing Assistance: Limited assistance ?   ? ?Functional Limitations Info  ?    ?  ?   ? ? ?SPECIAL CARE FACTORS FREQUENCY  ?    ?  ?  ?  ?  ?  ?  ?   ? ? ?Contractures Contractures Info: Not present  ? ? ?Additional Factors Info  ?Code Status, Allergies, Isolation Precautions, Psychotropic, Insulin Sliding Scale Code Status Info: Full ?Allergies Info: Bee Venom, Enalapril Maleate ?Psychotropic Info: Zoloft ?Insulin Sliding Scale Info: See dc summary ?Isolation Precautions Info: MRSA ?   ? ?Current Medications (06/20/2021):  This is the current hospital active medication list ?Current Facility-Administered Medications  ?  Medication Dose Route Frequency Provider Last Rate Last Admin  ? chlorhexidine (PERIDEX) 0.12 % solution 15 mL  15 mL Mouth/Throat BID McDiarmid, Blane Ohara, MD   15 mL at 06/20/21 0827  ? enoxaparin (LOVENOX) injection 30 mg  30 mg Subcutaneous Q24H Merrily Brittle, DO   30 mg at 06/20/21 0827  ? insulin aspart (novoLOG) injection 0-15 Units  0-15 Units Subcutaneous TID WC Merrily Brittle, DO      ? lacosamide  (VIMPAT) 200 mg in sodium chloride 0.9 % 25 mL IVPB  200 mg Intravenous Q24H Merrily Brittle, DO 90 mL/hr at 06/19/21 1111 200 mg at 06/19/21 1111  ? levETIRAcetam (KEPPRA) 750 mg in sodium chloride 0.9 % 100 mL IVPB  750 mg Intravenous Q12H Merrily Brittle, DO 430 mL/hr at 06/20/21 0827 750 mg at 06/20/21 0827  ? MEDLINE mouth rinse  15 mL Mouth Rinse 10 times per day Merrily Brittle, DO   15 mL at 06/20/21 3267  ? sertraline (ZOLOFT) tablet 25 mg  25 mg Oral Daily Ezequiel Essex, MD   25 mg at 06/20/21 1245  ? ? ? ?Discharge Medications: ?Please see discharge summary for a list of discharge medications. ? ?Relevant Imaging Results: ? ?Relevant Lab Results: ? ? ?Additional Information ?SSN: [id number] ? ?Lissa Morales Oakley Kossman, LCSW ? ? ? ? ?

## 2021-06-20 NOTE — Progress Notes (Signed)
Speech Language Pathology Treatment: Dysphagia  ?Patient Details ?Name: Katherine [name] ?MRN: [id number] ?DOB: [birth year] ?Today's Date: 06/20/2021 ?Time: 2481-8590 ?SLP Time Calculation (min) (ACUTE ONLY): 13 min ? ?Assessment / Plan / Recommendation ?Clinical Impression ? [name] was repositioned upright and pt alert, stating she was hungry. She reports she can chew anything with her gums without difficulty. Sequential sips water via straw were unremarkable with normal respiration pattern. Her mastication was within functional limits however due to current illness, upgrade to Dys 3 versus regular is recommended throughout her hospitalization and can upgrade when appropriate when returns to SNF. Will d/c ST at this time. ?  ?HPI HPI: 81 yo F presented to ED from [facility] after being found unresponsive by staff. Per notes, she presents with recurrent, almost daily, spells of aphasia which are being treated with vimpat and keppra. 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 She was admitted to [facility] in March of this year, swallow was evaluated and a dysphagia 2 diet with thin liquids was recommended. ?  ?   ?SLP Plan ? All goals met;Discharge SLP treatment due to (comment) ? ?  ?  ?Recommendations for follow up therapy are one component of a multi-disciplinary discharge planning process, led by the attending physician.  Recommendations may be updated based on patient status, additional functional criteria and insurance authorization. ?  ? ?Recommendations  ?Diet recommendations: Dysphagia 3 (mechanical soft);Thin liquid ?Liquids provided via: Cup;Straw ?Medication Administration: Whole meds with liquid ?Supervision: Staff to assist with self feeding;Intermittent supervision to cue for compensatory strategies (assist with feeding if needed) ?Compensations: Minimize environmental distractions;Slow rate;Small  sips/bites ?Postural Changes and/or Swallow Maneuvers: Seated upright 90 degrees  ?   ?    ?   ? ? ? ? Oral Care Recommendations: Oral care BID ?Follow Up Recommendations: No SLP follow up ?Assistance recommended at discharge: Intermittent Supervision/Assistance ?SLP Visit Diagnosis: Dysphagia, unspecified (R13.10) ?Plan: All goals met;Discharge SLP treatment due to (comment) ? ? ? ? ?  ?  ? ? ?Houston Siren ? ?06/20/2021, 9:08 AM ?

## 2021-06-20 NOTE — Progress Notes (Signed)
FPTS Brief Progress Note ? ?S: Patient awake, talkative, reports being thirsty ? ? ?O: ?BP 137/77 (BP Location: Left Arm)   Pulse 82   Temp 98.4 ?F (36.9 ?C) (Axillary)   Resp 20   Ht 5\' 4"  (1.626 m)   Wt 107.6 kg   SpO2 97%   BMI 40.72 kg/m?   ? ? ?A/P: ?- Orders reviewed. Labs for AM ordered, which was adjusted as needed.  ?- Please see H&P for more plan details ?- UA as urine noted to be very concentrated by staff ? ?Gladys Damme, MD ?06/20/2021, 3:48 AM ?PGY-3, [place] [facility] Night Resident  ?Please page [phone] with questions.  ? ? ?

## 2021-06-20 NOTE — Procedures (Addendum)
Patient Name: Katherine [name]  ?MRN: [id number]  ?Epilepsy Attending: [name]  ?Referring Physician/Provider: Greta Doom, MD ?Duration: 06/19/2021 1241 to 06/20/2021 1241 ?  ?Patient history: 81 yo F with recurrent, almost daily, spells of aphasia which are being treated with vimpat and keppra. EEG to evaluate for seizure ?  ?Level of alertness: Awake, asleep ?  ?AEDs during EEG study: LEV, LCM ?  ?Technical aspects: This EEG study was done with scalp electrodes positioned according to the 10-20 International system of electrode placement. Electrical activity was acquired at a sampling rate of 500Hz  and reviewed with a high frequency filter of 70Hz  and a low frequency filter of 1Hz . EEG data were recorded continuously and digitally stored.  ?  ?Description: The posterior dominant rhythm consists of 7 Hz activity of moderate voltage (25-35 uV) seen predominantly in posterior head regions, symmetric and reactive to eye opening and eye closing. Sleep was characterized by sleep spindles (12-14hz ), maximal frontocentral region.  EEG showed continuous generalized 5-7 hz theta slowing at times with triphasic morphology.  Hyperventilation and photic stimulation were not performed.  ? ?On 06/19/2021 at 1620, patient was noted to have bilateral hand tremors, eyes open. Concomitant EEG before during and after the event didn't show any eeg change to suggest seizure.  ?  ?ABNORMALITY ?- Background slow ?- Continuous slow, generalized ?  ?IMPRESSION: ?This study is suggestive of mild to moderate diffuse encephalopathy, nonspecific etiology. No seizures or definite epileptiform discharges were seen throughout the recording. ? ?On 06/19/2021 at 1620, patient was noted to have bilateral hand tremors, eyes open without concomitant EEG change. This was not an epileptic event.  ?  ?[name]  ?

## 2021-06-20 NOTE — Progress Notes (Signed)
LTM maint complete - no skin breakdown under: Fp2 F8 F4 ?Serviced Fp2 T7 P3 T8 ?[facility] monitored, Event button test confirmed by [facility]. ? ?

## 2021-06-20 NOTE — Progress Notes (Signed)
EEG maint complete.  ?

## 2021-06-20 NOTE — Progress Notes (Signed)
?  Transition of Care (TOC) Screening Note ? ? ?Patient Details  ?Name: Katherine [name] ?Date of Birth: 01-13-1941 ? ? ?Transition of Care (TOC) CM/SW Contact:    ?Benard Halsted, LCSW ?Phone Number: ?06/20/2021, 10:03 AM ? ? ? ?Transition of Care Department [facility]) has reviewed patient. She resides at [facility] under long term care. We will continue to monitor patient advancement through interdisciplinary progression rounds. If new patient transition needs arise, please place a TOC consult. ? ? ?

## 2021-06-21 DIAGNOSIS — R569 Unspecified convulsions: Secondary | ICD-10-CM | POA: Diagnosis not present

## 2021-06-21 LAB — CBC
HCT: 36.2 % (ref 36.0–46.0)
Hemoglobin: 11.5 g/dL — ABNORMAL LOW (ref 12.0–15.0)
MCH: 30.5 pg (ref 26.0–34.0)
MCHC: 31.8 g/dL (ref 30.0–36.0)
MCV: 96 fL (ref 80.0–100.0)
Platelets: 130 10*3/uL — ABNORMAL LOW (ref 150–400)
RBC: 3.77 MIL/uL — ABNORMAL LOW (ref 3.87–5.11)
RDW: 12 % (ref 11.5–15.5)
WBC: 4.7 10*3/uL (ref 4.0–10.5)
nRBC: 0 % (ref 0.0–0.2)

## 2021-06-21 LAB — BASIC METABOLIC PANEL
Anion gap: 9 (ref 5–15)
BUN: 17 mg/dL (ref 8–23)
CO2: 30 mmol/L (ref 22–32)
Calcium: 9.3 mg/dL (ref 8.9–10.3)
Chloride: 101 mmol/L (ref 98–111)
Creatinine, Ser: 1.5 mg/dL — ABNORMAL HIGH (ref 0.44–1.00)
GFR, Estimated: 35 mL/min — ABNORMAL LOW (ref >60)
Glucose, Bld: 84 mg/dL (ref 70–99)
Potassium: 4 mmol/L (ref 3.5–5.1)
Sodium: 140 mmol/L (ref 135–145)

## 2021-06-21 LAB — GLUCOSE, CAPILLARY
Glucose-Capillary: 91 mg/dL (ref 70–99)
Glucose-Capillary: 84 mg/dL (ref 70–99)
Glucose-Capillary: 115 mg/dL — ABNORMAL HIGH (ref 70–99)
Glucose-Capillary: 99 mg/dL (ref 70–99)

## 2021-06-21 MED ORDER — IPRATROPIUM-ALBUTEROL 0.5-2.5 (3) MG/3ML IN SOLN: 3.0000 mL | Freq: Four times a day (QID) | RESPIRATORY_TRACT | 0 refills | Status: DC | PRN

## 2021-06-21 MED ORDER — SERTRALINE HCL 50 MG PO TABS: 50.0000 mg | ORAL_TABLET | Freq: Every day | ORAL | Status: DC

## 2021-06-21 MED ORDER — IPRATROPIUM-ALBUTEROL 0.5-2.5 (3) MG/3ML IN SOLN
3.0000 mL | Freq: Two times a day (BID) | RESPIRATORY_TRACT | Status: DC
  Administered 2021-06-21: 3 mL via RESPIRATORY_TRACT
  Filled 2021-06-21: qty 3

## 2021-06-21 MED ORDER — GABAPENTIN 100 MG PO CAPS
200.0000 mg | ORAL_CAPSULE | Freq: Every day | ORAL | Status: DC
Start: 2021-06-21 — End: 2022-09-16

## 2021-06-21 NOTE — Progress Notes (Signed)
LTM EEG discontinued - no skin breakdown at [place].   

## 2021-06-21 NOTE — Progress Notes (Signed)
Family Medicine Teaching Service ?Daily Progress Note ?Intern Pager: [phone] ? ?Patient name: Katherine [name] Medical record number: [id number] ?Date of birth: 1940/11/11 Age: 81 y.o. Gender: female ? ?Primary Care Provider: Care, [place] ?Consultants: Neuro  ?Code Status: Full Code, confirmed with SNF [facility] [phone]  ? ?Pt Overview and Major Events to Date:  ?5/6 admitted ? ? ?Assessment and Plan: ?[name] is a 81 year old female who presents with recurrent episodic aphasia concerning for seizure versus TIA. PMH includes seizures, HFpEF, HTN, T2DM, CKD 3B, COPD, stroke. ?  ?Recurrent aphasia of unknown etiology, h/o seizures ?CT head negative. 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 2 days of continuous EEG overnight showed no seizure activities.  Encephalopathy work-up thus far: No symptomatic hypoglycemia, no electrolyte derangements, creatinine improving and at baseline, no white count.  Speech is fluent and not slurred and coherent.  Comprehension intact.  Attention is impaired consistent with likely underlying cognitive impairment but has not been specifically diagnosed ?- PT/OT eval and treat ?- Appreciate neurology recommendations ?-- Continued PO home keppra and vimpat ?-- Neurology will schedule follow-up with outpatient neurology clinic to be evaluated for underlying cognitive disorder or neurodegenerative disease. ? ? ?AoCKD3B, stable ?Baseline appears to be 1.6.  Improving Cr (!) 1.50  ?  ?Thrombocytopenia, stable ?142 on admission, baseline the last couple of years appears to be closer to 170.  At 130 on 5/9.  No bleeding anywhere. ? ?HFpEF (07/2018) ?HTN, stable ?K+ >4, Mg2+ >2, replete as needed. UOP 194mL.  Baseline O2 requirement 2 L [place], back to baseline ?-- No home med on SNF med rec ?-- Wean O2 as tolerated, at baseline currently ?  ?DM2 with neuropathy, diet  controlled ?A1c 5.6 (06/18/2021). Restarted home gabapentin at a lower dose, 200 mg given patient has not complained of neuropathy since not having for the past 2 days. ?-- No antidiabetics currently ?-- CBGs, no SSI ?-- Restarted home gabapentin at a lower dose, 200 mg  ? ?Chronic respiratory failure on 2L [place] 2/2 COPD and OSA ?Home: Trelegy and DuoNebs . ?-- On baseline- 2 L nasal cannula ?-- Restarted Trelegy equivalent-Breo Ellipta and Incruse Ellipta ?-- BiPAP qHS ?--Home DuoNebs as needed ? ?GERD ?--Pantoprazole 20 mg daily ?  ?MDD, GAD ?-- zoloft 50 mg daily ? ?FEN/GI: Dysphagia 3 diet per SLP ?PPx: Enoxaparin (lovenox) injection 30 mg start: 06/18/21 1345  ? ?Dispo: Skilled nursing-short term rehab (<3 hours/day) (Pt is a resident at [facility]; worth considering starting a round of PT/OT/ST services), No SLP follow up  ?DME: Other (comment) (Can defer to REhab staff at [facility]) (06/19/21) ?Barrier: None ? ?Subjective:  ?ON: No acute events, no seizures seen on continuous EEG. ? ?No family at bedside. ?Patient was seen awake, alert, moderately engaged.  Became frustrated when asked about orientation questions.  Otherwise denied concerns, chest pain, SOB, dysuria, pain, speech difficulties. ? ?Objective: ?Temp:  [97.4 ?F (36.3 ?C)-98.5 ?F (36.9 ?C)] 98.5 ?F (36.9 ?C) (05/09 1212) ?Pulse Rate:  [72-96] 75 (05/09 1212) ?Resp:  [19-25] 25 (05/09 1212) ?BP: (99-134)/(56-84) 134/79 (05/09 1212) ?SpO2:  [82 %-98 %] 97 % (05/09 1212) ? ?MSE ?Alert, awake, moderately engaged in evaluation.  Oriented to self only.  Declined to answer other orientation questions. ?Speech was clear, coherent, no dysarthria ?Impaired attention, declined to do to any attention testing. ?Impaired repetition, declined to do  any repetition testing. ?Naming of common and less common items intact. ?Comprehension intact, can complete three-point instructions without difficulty ? ?Neuro ?No focal deficits ? ?General: In no acute distress, awake, alert and  oriented x1 per above ?Cardiovascular: Regular rate and rhythm, no murmurs appreciated ?Respiratory: CTAB, normal work of breathing, no wheezing or crackles appreciated-although difficult due to habitus ?Abdomen: Obese abdomen, soft, nontender, no tenderness to palpation ? ?Laboratory: ?Recent Labs  ?Lab 06/19/21 ?0113 06/20/21 ?0300 06/21/21 ?0358  ?WBC 4.5 5.3 4.7  ?HGB 12.4 11.6* 11.5*  ?HCT 41.0 37.9 36.2  ?PLT 141* 135* 130*  ? ?Recent Labs  ?Lab 06/18/21 ?0953 06/18/21 ?1115 06/19/21 ?0113 06/19/21 ?1048 06/20/21 ?9233 06/21/21 ?0358  ?NA 142   < > 145  --  145 140  ?K 5.2*   < > 5.6* 4.6 4.5 4.0  ?CL 102   < > 104  --  104 101  ?CO2 31  --  34*  --  34* 30  ?BUN 29*   < > 28*  --  23 17  ?CREATININE 2.01*   < > 1.83*  --  1.65* 1.50*  ?CALCIUM 9.3  --  9.5  --  9.2 9.3  ?PROT 7.1  --  7.2  --   --   --   ?BILITOT 1.0  --  0.9  --   --   --   ?ALKPHOS 86  --  82  --   --   --   ?ALT 10  --  8  --   --   --   ?AST 15  --  11*  --   --   --   ?GLUCOSE 90   < > 84  --  78 84  ? < > = values in this interval not displayed.  ? ?Imaging/Diagnostic Tests: ?CT HEAD WITHOUT CONTRAST CODE STROKE 06/18/21 ?IMPRESSION: ?1. Stable CT appearance of the brain with no acute cortically based ?infarct or acute intracranial hemorrhage identified. ASPECTS 10. ?2. #1 was communicated to Dr. Leonel Ramsay at 10:06 am on 06/18/2021 by text page via the [facility] messaging system. ?3. Ongoing chronic right middle ear, mastoid, and some paranasal ?sinus inflammation. ? ?PORTABLE CHEST 1 VIEW 06/18/21 ?IMPRESSION: ?Low volume study with mild basilar atelectasis. ? ?MRI neg for acute stroke ? ?Continuous overnight EEG showed no evidence of seizure.  ? ?Merrily Brittle, DO ?06/21/2021, 12:50 PM ?PGY-1, [place] [facility] ?[place] Intern pager: [phone], text pages welcome  ?

## 2021-06-21 NOTE — Progress Notes (Signed)
FPTS Brief Note ?Reviewed patient's vitals, recent notes.  ?Vitals:  ? 06/21/21 0000 06/21/21 0130  ?BP: (!) 99/56 118/84  ?Pulse: 72 96  ?Resp: (!) 21 20  ?Temp: 98.4 ?F (36.9 ?C)   ?SpO2: 95% 97%  ? ?At this time, no change in plan from day progress note.  ?Gerrit Heck, MD ?Page [phone] with questions about this patient.  ?  ?

## 2021-06-21 NOTE — TOC Transition Note (Signed)
Transition of Care (TOC) - CM/SW Discharge Note ? ? ?Patient Details  ?Name: Katherine [name] ?MRN: [id number] ?Date of Birth: 11/03/1940 ? ?Transition of Care (TOC) CM/SW Contact:  ?Benard Halsted, LCSW ?Phone Number: ?06/21/2021, 2:02 PM ? ? ?Clinical Narrative:    ?Patient will DC to: [facility] ?Anticipated DC date: 06/21/21 ?Family notified: Spouse ?Transport by: Corey Harold ? ? ?Per MD patient ready for DC to [facility]. RN to call report prior to discharge [phone] room 34A). RN, patient, patient's family, and facility notified of DC. Discharge Summary and FL2 sent to facility. DC packet on chart. Ambulance transport requested for patient.  ? ?CSW will sign off for now as social work intervention is no longer needed. Please consult [place] again if new needs arise. ? ? ? ? ?Final next level of care: [place] ?Barriers to Discharge: Barriers Resolved ? ? ?Patient Goals and CMS Choice ?Patient states their goals for this hospitalization and ongoing recovery are:: Return to SNF ?CMS Medicare.gov Compare Post Acute Care list provided to:: Patient Represenative (must comment) ?Choice offered to / list presented to : Spouse ? ?Discharge Placement ?  ?Existing PASRR number confirmed : 06/21/21          ?Patient chooses bed at: [facility] ?Patient to be transferred to facility by: [facility] ?Name of family member notified: Spouse ?Patient and family notified of of transfer: 06/21/21 ? ?Discharge Plan and Services ?In-house Referral: Clinical Social Work ?  ?Post Acute Care Choice: [place]          ?  ?  ?  ?  ?  ?  ?  ?  ?  ?  ? ?Social Determinants of Health (SDOH) Interventions ?  ? ? ?Readmission Risk Interventions ? ?  12/29/2019  ? 10:19 AM 09/26/2019  ? 10:54 AM 11/26/2018  ? 10:10 AM  ?Readmission Risk Prevention Plan  ?Transportation Screening Complete Complete Complete  ?Medication Review Press photographer) Complete Complete Complete  ?PCP or Specialist appointment within 3-5  days of discharge Not Complete    ?PCP/Specialist Appt Not Complete comments Will return to SNF    ?[place] or Home Care Consult Complete    ?SW Recovery Care/Counseling Consult Complete Complete Complete  ?Palliative Care Screening Not Applicable Not Applicable   ?Skilled Nursing Facility Complete Not Applicable Complete  ? ? ? ? ? ?

## 2021-06-21 NOTE — Progress Notes (Signed)
Report called  

## 2021-06-21 NOTE — Discharge Summary (Signed)
Family Medicine Teaching Service ?Hospital Discharge Summary ? ?Patient name: Katherine [name] Medical record number: [id number] ?Date of birth: 1940-11-30 Age: 81 y.o. Gender: female ?Date of Admission: 06/18/2021  Date of Discharge: 06/21/2021 ?Admitting Physician: Merrily Brittle, DO ? ?Primary Care Provider: Care, [place] ?Consultants: Neuro ? ?Indication for Hospitalization:  ?Seizure-like activity ?  ? ?Discharge Diagnoses/Problem List:  ?Principal Problem: ?  Seizure-like activity ([place]) ?Active Problems: ?  Hypertension associated with diabetes ([place]) ?  COPD (chronic obstructive pulmonary disease) ([place]) ?  OSA (obstructive sleep apnea) ?  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[place]) ?  Seizure disorder ([place]) ?  Diabetes mellitus type 2, controlled, with complications ([place]) ?  Type 2 diabetes mellitus with stage 4 chronic kidney disease ([place]) ?  Chronic kidney disease, stage 4 (severe) ([facility]) ?  Cerebral atrophy ([place]) ?  Cognitive and neurobehavioral dysfunction ?  Spells of speech arrest ?  ? ?Disposition:  ?SNF ? ?Discharge Condition:  ?Stable  ? ?Discharge Exam:  ?Temp:  [97.4 ?F (36.3 ?C)-98.5 ?F (36.9 ?C)] 98.5 ?F (36.9 ?C) (05/09 1212) ?Pulse Rate:  [72-96] 75 (05/09 1212) ?Resp:  [19-25] 25 (05/09 1212) ?BP: (99-134)/(56-84) 134/79 (05/09 1212) ?SpO2:  [82 %-98 %] 97 % (05/09 1212)  ? ?MSE ?Alert, awake, moderately engaged in evaluation.  Oriented to self only.  Declined to answer other orientation questions. ?Speech was clear, coherent, no dysarthria ?Impaired attention, declined to do to any attention testing. ?Impaired repetition, declined to do any repetition testing. ?Naming of common and less common items intact. ?Comprehension intact, can complete three-point instructions without difficulty ?  ?Neuro ?No focal deficits ?  ?General: In no acute distress, awake, alert and oriented x1 per above ?Cardiovascular: Regular rate and rhythm, no murmurs appreciated ?Respiratory: CTAB, normal work of  breathing, no wheezing or crackles appreciated-although difficult due to habitus ?Abdomen: Obese abdomen, soft, nontender, no tenderness to palpation ? ?Brief Hospital Course:  ?Katherine [name] is a 81 y.o. female with PMH seizures on Keppra and Vimpat, HFpEF (07/2018), HTN, DM2 diet controlled, CKD3B, COPD on 2L [place], stroke, who presented to [facility] (06/18/2021) as code stroke from SNF [facility] [phone])) for recurrent aphasia admitted for overnight EEG for seizure, stroke rule out and encephalopathy work-up.  ? ?Was recently hospitalized for hypercarbia and UTI where she was discharged on BiPAP and antibiotics. ? ?Recurrent aphasias of unclear etiology however suspect 2/2 toxic encephalopathy vs delirium ?History of seizures on Vimpat and Keppra ?Initially admitted as a code stroke, with [place] and MRI negative. For further history, aphasia tends to be recurrent, concerning for possible breakthrough seizure, however overnight EEG x2 days that showed no evidence of seizures.  Work-up for encephalopathy was also negative for infectious and metabolic etiologies. TSH 0.552. Did have a UA with leuks, nitrites, bacteria. However given age and lack of fevers, no leukocytosis, baseline mentation, along with no symptoms of UTI, this is likely not per the McGeer criteria for geriatric, did not treat.  ?Home: Vimpat 100 mg twice daily, Keppra 750 mg twice daily ?Home meds per above resumed ?Follow-up with neurology outpatient, neurology has set up, see below ? ?AoCKD3B 2/2 dehydration ?Baseline Cr 1.6. On day of DC Cr (!) 1.50  ?Cr 2 on admission, was responsive to IV LR bolus. ? ?HFpEF (07/2018), HTN ?Dry weight 108 kg.  ?Home: None ? ?DM2 with neuropathy, diet controlled ?A1c 5.6 (06/18/2021).  CBGs 70s-100s, no insulin required.  Gabapentin was held for 2 days, no complaints of pain or withdrawal.  He was to be started at a reduced dose given unclear encephalopathy and AKI. ?Home: Gabapentin 300 mg qHS ?No  antidiabetics medication ?Restarted home gabapentin at 200 mg given admission for AMS ? ?Chronic RF on 2L [place] 2/2 COPD and OSA ?Home: BiPAP qHS, Trelegy, DuoNeb ?Continued during hospitalization, weaned down to 2 L. ? ?MDD, GAD ?Home: Zoloft 25 mg ?Increased Zoloft 50 mg (effective minimum dose) ? ?Other chronic diagnosis: ?GERD on Protonix 20 mg daily ?Thrombocytopenia, PLT 140s-160s stable ?Essential tremors: Propranolol 10 mg twice daily ? ?Discharge follow up recommendations: ?Hematuria on UA: Found to have moderate Hgb on UA, 11-20 RBC on micro. PCP to follow up and repeat. If persistent on recheck, recommend referral to urology for evaluation.  ?Increased zoloft from 25 mg to 50 mg while admitted.  Can continue 50 mg if clinically improved, minimum effective dose. However if still presenting with symptoms, can titrate up to 100 mg, max 200mg  as tolerated and needed.  PCP to follow up and titrate as necessary.  ?Decreased home gabapentin 300 mg nightly to 200 mg given admission of altered mental status and change in speech, with no evidence of stroke or seizure on tests.  This was held during the first 2 days of hospitalization, patient did not complain of pain. ?Seizure history on Vimpat 100 mg twice daily, Keppra 750 mg twice daily, continued original doses.  Neurology scheduled follow-up with outpatient neurology clinic to be evaluated for underlying cognitive disorder or neurodegenerative disease. ?Consider baseline cognitive impairment evaluation for monitoring.  No official diagnosis of dementia ? ? ?Significant Labs and Imaging:  ?Recent Labs  ?Lab 06/19/21 ?0113 06/20/21 ?7517 06/21/21 ?0358  ?WBC 4.5 5.3 4.7  ?HGB 12.4 11.6* 11.5*  ?HCT 41.0 37.9 36.2  ?PLT 141* 135* 130*  ? ?Recent Labs  ?Lab 06/18/21 ?0953 06/18/21 ?1115 06/19/21 ?0113 06/19/21 ?1048 06/20/21 ?0017 06/21/21 ?0358  ?NA 142 143 145  --  145 140  ?K 5.2* 4.9 5.6*   < > 4.5 4.0  ?CL 102 101 104  --  104 101  ?CO2 31  --  34*  --  34* 30   ?GLUCOSE 90 89 84  --  78 84  ?BUN 29* 33* 28*  --  23 17  ?CREATININE 2.01* 2.00* 1.83*  --  1.65* 1.50*  ?CALCIUM 9.3  --  9.5  --  9.2 9.3  ?ALKPHOS 86  --  82  --   --   --   ?AST 15  --  11*  --   --   --   ?ALT 10  --  8  --   --   --   ?ALBUMIN 3.3*  --  3.4*  --   --   --   ? < > = values in this interval not displayed.  ? ?EKG Interpretation ? ?Date/Time:  Saturday Jun 18 2021 10:15:13 EDT ?Ventricular Rate:  76 ?PR Interval:  172 ?QRS Duration: 94 ?QT Interval:  392 ?QTC Calculation: 441 ?R Axis:   -64 ?Text Interpretation: Sinus rhythm LAD, consider left anterior fascicular block Confirmed by Kommor, [place] ([id number]) on 06/18/2021 4:14:38 PM ?  ?Results for orders placed or performed during the hospital encounter of 06/18/21 (from the past 24 hour(s))  ?Glucose, capillary     Status: None  ? Collection Time: 06/20/21  4:12 PM  ?Result Value Ref Range  ? Glucose-Capillary 99 70 - 99 mg/dL  ?CBC     Status: Abnormal  ? Collection Time:  06/21/21  3:58 AM  ?Result Value Ref Range  ? WBC 4.7 4.0 - 10.5 K/uL  ? RBC 3.77 (L) 3.87 - 5.11 MIL/uL  ? Hemoglobin 11.5 (L) 12.0 - 15.0 g/dL  ? HCT 36.2 36.0 - 46.0 %  ? MCV 96.0 80.0 - 100.0 fL  ? MCH 30.5 26.0 - 34.0 pg  ? MCHC 31.8 30.0 - 36.0 g/dL  ? RDW 12.0 11.5 - 15.5 %  ? Platelets 130 (L) 150 - 400 K/uL  ? nRBC 0.0 0.0 - 0.2 %  ?Basic metabolic panel     Status: Abnormal  ? Collection Time: 06/21/21  3:58 AM  ?Result Value Ref Range  ? Sodium 140 135 - 145 mmol/L  ? Potassium 4.0 3.5 - 5.1 mmol/L  ? Chloride 101 98 - 111 mmol/L  ? CO2 30 22 - 32 mmol/L  ? Glucose, Bld 84 70 - 99 mg/dL  ? BUN 17 8 - 23 mg/dL  ? Creatinine, Ser 1.50 (H) 0.44 - 1.00 mg/dL  ? Calcium 9.3 8.9 - 10.3 mg/dL  ? GFR, Estimated 35 (L) >60 mL/min  ? Anion gap 9 5 - 15  ?Glucose, capillary     Status: None  ? Collection Time: 06/21/21  7:41 AM  ?Result Value Ref Range  ? Glucose-Capillary 91 70 - 99 mg/dL  ?Glucose, capillary     Status: None  ? Collection Time: 06/21/21 12:11 PM  ?Result Value  Ref Range  ? Glucose-Capillary 84 70 - 99 mg/dL  ?  ?DG Chest Port 1 View ? ?Result Date: 06/18/2021 ?CLINICAL DATA:  Altered mental status EXAM: PORTABLE CHEST 1 VIEW COMPARISON:  04/19/2021 and prior radiograph FIN

## 2021-06-21 NOTE — Procedures (Addendum)
Patient Name: Katherine [name]  ?MRN: [id number]  ?Epilepsy Attending: [name]  ?Referring Physician/Provider: Greta Doom, MD ?Duration: 06/20/2021 1241 to 06/21/2021 1056 ?  ?Patient history: 81 yo F with recurrent, almost daily, spells of aphasia which are being treated with vimpat and keppra. EEG to evaluate for seizure ?  ?Level of alertness: Awake, asleep ?  ?AEDs during EEG study: LEV, LCM ?  ?Technical aspects: This EEG study was done with scalp electrodes positioned according to the 10-20 International system of electrode placement. Electrical activity was acquired at a sampling rate of 500Hz  and reviewed with a high frequency filter of 70Hz  and a low frequency filter of 1Hz . EEG data were recorded continuously and digitally stored.  ?  ?Description: The posterior dominant rhythm consists of 7 Hz activity of moderate voltage (25-35 uV) seen predominantly in posterior head regions, symmetric and reactive to eye opening and eye closing. Sleep was characterized by sleep spindles (12-14hz ), maximal frontocentral region.  EEG showed continuous generalized 5-7 hz theta slowing at times with triphasic morphology.  Hyperventilation and photic stimulation were not performed.  ?  ?ABNORMALITY ?- Background slow ?- Continuous slow, generalized ?  ?IMPRESSION: ?This study is suggestive of mild to moderate diffuse encephalopathy, nonspecific etiology. No seizures or definite epileptiform discharges were seen throughout the recording. ?  ?[name]  ?

## 2021-06-21 NOTE — Progress Notes (Signed)
EEG maintenance performed.  No skin breakdown observed at electrode sites Fp1, Fp2. 

## 2021-06-22 DIAGNOSIS — J449 Chronic obstructive pulmonary disease, unspecified: Secondary | ICD-10-CM | POA: Diagnosis not present

## 2021-06-22 DIAGNOSIS — M6281 Muscle weakness (generalized): Secondary | ICD-10-CM | POA: Diagnosis not present

## 2021-06-22 DIAGNOSIS — F329 Major depressive disorder, single episode, unspecified: Secondary | ICD-10-CM | POA: Diagnosis not present

## 2021-06-22 DIAGNOSIS — K219 Gastro-esophageal reflux disease without esophagitis: Secondary | ICD-10-CM | POA: Diagnosis not present

## 2021-06-22 DIAGNOSIS — M1990 Unspecified osteoarthritis, unspecified site: Secondary | ICD-10-CM | POA: Diagnosis not present

## 2021-06-22 DIAGNOSIS — S30810D Abrasion of lower back and pelvis, subsequent encounter: Secondary | ICD-10-CM | POA: Diagnosis not present

## 2021-06-22 DIAGNOSIS — J9622 Acute and chronic respiratory failure with hypercapnia: Secondary | ICD-10-CM | POA: Diagnosis not present

## 2021-06-22 DIAGNOSIS — G4733 Obstructive sleep apnea (adult) (pediatric): Secondary | ICD-10-CM | POA: Diagnosis not present

## 2021-06-22 DIAGNOSIS — L853 Xerosis cutis: Secondary | ICD-10-CM | POA: Diagnosis not present

## 2021-06-22 DIAGNOSIS — G250 Essential tremor: Secondary | ICD-10-CM | POA: Diagnosis not present

## 2021-06-22 DIAGNOSIS — G629 Polyneuropathy, unspecified: Secondary | ICD-10-CM | POA: Diagnosis not present

## 2021-06-22 DIAGNOSIS — R569 Unspecified convulsions: Secondary | ICD-10-CM | POA: Diagnosis not present

## 2021-06-22 DIAGNOSIS — R278 Other lack of coordination: Secondary | ICD-10-CM | POA: Diagnosis not present

## 2021-06-22 DIAGNOSIS — Z1159 Encounter for screening for other viral diseases: Secondary | ICD-10-CM | POA: Diagnosis not present

## 2021-06-22 NOTE — Final Progress Note (Signed)
Transport arrived on unit. @2158 , patient left unit with belongings in bag, via stretcher, no noted distress. ?

## 2021-06-23 DIAGNOSIS — R4020 Unspecified coma: Secondary | ICD-10-CM | POA: Diagnosis not present

## 2021-06-23 DIAGNOSIS — I130 Hypertensive heart and chronic kidney disease with heart failure and stage 1 through stage 4 chronic kidney disease, or unspecified chronic kidney disease: Secondary | ICD-10-CM | POA: Diagnosis not present

## 2021-06-23 DIAGNOSIS — R531 Weakness: Secondary | ICD-10-CM | POA: Diagnosis not present

## 2021-06-23 DIAGNOSIS — R569 Unspecified convulsions: Secondary | ICD-10-CM | POA: Diagnosis not present

## 2021-06-23 DIAGNOSIS — I5032 Chronic diastolic (congestive) heart failure: Secondary | ICD-10-CM | POA: Diagnosis not present

## 2021-06-23 DIAGNOSIS — R4189 Other symptoms and signs involving cognitive functions and awareness: Secondary | ICD-10-CM | POA: Diagnosis not present

## 2021-06-23 DIAGNOSIS — G40909 Epilepsy, unspecified, not intractable, without status epilepticus: Secondary | ICD-10-CM | POA: Diagnosis not present

## 2021-06-23 DIAGNOSIS — J441 Chronic obstructive pulmonary disease with (acute) exacerbation: Secondary | ICD-10-CM | POA: Diagnosis not present

## 2021-06-23 DIAGNOSIS — R0689 Other abnormalities of breathing: Secondary | ICD-10-CM | POA: Diagnosis not present

## 2021-06-23 DIAGNOSIS — R5381 Other malaise: Secondary | ICD-10-CM | POA: Diagnosis not present

## 2021-06-23 DIAGNOSIS — R4182 Altered mental status, unspecified: Secondary | ICD-10-CM | POA: Diagnosis not present

## 2021-06-23 DIAGNOSIS — E662 Morbid (severe) obesity with alveolar hypoventilation: Secondary | ICD-10-CM | POA: Diagnosis not present

## 2021-06-23 DIAGNOSIS — E7849 Other hyperlipidemia: Secondary | ICD-10-CM | POA: Diagnosis not present

## 2021-06-23 DIAGNOSIS — S80811D Abrasion, right lower leg, subsequent encounter: Secondary | ICD-10-CM | POA: Diagnosis not present

## 2021-06-23 DIAGNOSIS — N184 Chronic kidney disease, stage 4 (severe): Secondary | ICD-10-CM | POA: Diagnosis not present

## 2021-06-23 DIAGNOSIS — G319 Degenerative disease of nervous system, unspecified: Secondary | ICD-10-CM | POA: Diagnosis not present

## 2021-06-23 DIAGNOSIS — I509 Heart failure, unspecified: Secondary | ICD-10-CM | POA: Diagnosis not present

## 2021-06-23 DIAGNOSIS — R7309 Other abnormal glucose: Secondary | ICD-10-CM | POA: Diagnosis not present

## 2021-06-23 DIAGNOSIS — R0902 Hypoxemia: Secondary | ICD-10-CM | POA: Diagnosis not present

## 2021-06-23 DIAGNOSIS — Z741 Need for assistance with personal care: Secondary | ICD-10-CM | POA: Diagnosis not present

## 2021-06-23 DIAGNOSIS — R262 Difficulty in walking, not elsewhere classified: Secondary | ICD-10-CM | POA: Diagnosis not present

## 2021-06-23 DIAGNOSIS — R4789 Other speech disturbances: Secondary | ICD-10-CM | POA: Diagnosis not present

## 2021-06-23 DIAGNOSIS — N189 Chronic kidney disease, unspecified: Secondary | ICD-10-CM | POA: Diagnosis not present

## 2021-06-23 DIAGNOSIS — Z743 Need for continuous supervision: Secondary | ICD-10-CM | POA: Diagnosis not present

## 2021-06-23 DIAGNOSIS — E119 Type 2 diabetes mellitus without complications: Secondary | ICD-10-CM | POA: Diagnosis not present

## 2021-06-23 DIAGNOSIS — J9622 Acute and chronic respiratory failure with hypercapnia: Secondary | ICD-10-CM | POA: Diagnosis not present

## 2021-06-23 DIAGNOSIS — R6889 Other general symptoms and signs: Secondary | ICD-10-CM | POA: Diagnosis not present

## 2021-06-23 DIAGNOSIS — J449 Chronic obstructive pulmonary disease, unspecified: Secondary | ICD-10-CM | POA: Diagnosis not present

## 2021-06-23 DIAGNOSIS — R0602 Shortness of breath: Secondary | ICD-10-CM | POA: Diagnosis not present

## 2021-06-23 DIAGNOSIS — M6281 Muscle weakness (generalized): Secondary | ICD-10-CM | POA: Diagnosis not present

## 2021-06-23 DIAGNOSIS — S30810D Abrasion of lower back and pelvis, subsequent encounter: Secondary | ICD-10-CM | POA: Diagnosis not present

## 2021-06-23 DIAGNOSIS — R41841 Cognitive communication deficit: Secondary | ICD-10-CM | POA: Diagnosis not present

## 2021-06-23 DIAGNOSIS — I10 Essential (primary) hypertension: Secondary | ICD-10-CM | POA: Diagnosis not present

## 2021-06-23 DIAGNOSIS — Z20822 Contact with and (suspected) exposure to covid-19: Secondary | ICD-10-CM | POA: Diagnosis not present

## 2021-06-23 DIAGNOSIS — K219 Gastro-esophageal reflux disease without esophagitis: Secondary | ICD-10-CM | POA: Diagnosis not present

## 2021-06-23 DIAGNOSIS — E1122 Type 2 diabetes mellitus with diabetic chronic kidney disease: Secondary | ICD-10-CM | POA: Diagnosis not present

## 2021-06-23 DIAGNOSIS — R410 Disorientation, unspecified: Secondary | ICD-10-CM | POA: Diagnosis not present

## 2021-06-27 ENCOUNTER — Emergency Department: Payer: Medicare HMO

## 2021-06-27 ENCOUNTER — Emergency Department
Admission: EM | Admit: 2021-06-27 | Discharge: 2021-06-27 | Disposition: A | Discharge status: Home / Self Care | Payer: Medicare HMO | Attending: Emergency Medicine | Admitting: Emergency Medicine

## 2021-06-27 ENCOUNTER — Other Ambulatory Visit: Payer: Self-pay

## 2021-06-27 DIAGNOSIS — I130 Hypertensive heart and chronic kidney disease with heart failure and stage 1 through stage 4 chronic kidney disease, or unspecified chronic kidney disease: Secondary | ICD-10-CM | POA: Insufficient documentation

## 2021-06-27 DIAGNOSIS — R0602 Shortness of breath: Secondary | ICD-10-CM | POA: Insufficient documentation

## 2021-06-27 DIAGNOSIS — N189 Chronic kidney disease, unspecified: Secondary | ICD-10-CM | POA: Diagnosis not present

## 2021-06-27 DIAGNOSIS — I509 Heart failure, unspecified: Secondary | ICD-10-CM | POA: Diagnosis not present

## 2021-06-27 DIAGNOSIS — R4189 Other symptoms and signs involving cognitive functions and awareness: Secondary | ICD-10-CM | POA: Diagnosis not present

## 2021-06-27 DIAGNOSIS — R7309 Other abnormal glucose: Secondary | ICD-10-CM | POA: Diagnosis not present

## 2021-06-27 DIAGNOSIS — R0902 Hypoxemia: Secondary | ICD-10-CM | POA: Diagnosis not present

## 2021-06-27 DIAGNOSIS — R4182 Altered mental status, unspecified: Secondary | ICD-10-CM | POA: Insufficient documentation

## 2021-06-27 DIAGNOSIS — Z20822 Contact with and (suspected) exposure to covid-19: Secondary | ICD-10-CM | POA: Diagnosis not present

## 2021-06-27 LAB — COMPREHENSIVE METABOLIC PANEL
ALT: 8 U/L (ref 0–44)
AST: 13 U/L — ABNORMAL LOW (ref 15–41)
Albumin: 3.3 g/dL — ABNORMAL LOW (ref 3.5–5.0)
Alkaline Phosphatase: 76 U/L (ref 38–126)
Anion gap: 9 (ref 5–15)
BUN: 28 mg/dL — ABNORMAL HIGH (ref 8–23)
CO2: 31 mmol/L (ref 22–32)
Calcium: 9.1 mg/dL (ref 8.9–10.3)
Chloride: 102 mmol/L (ref 98–111)
Creatinine, Ser: 1.87 mg/dL — ABNORMAL HIGH (ref 0.44–1.00)
GFR, Estimated: 27 mL/min — ABNORMAL LOW (ref >60)
Glucose, Bld: 117 mg/dL — ABNORMAL HIGH (ref 70–99)
Potassium: 4.5 mmol/L (ref 3.5–5.1)
Sodium: 142 mmol/L (ref 135–145)
Total Bilirubin: 0.5 mg/dL (ref 0.3–1.2)
Total Protein: 7.1 g/dL (ref 6.5–8.1)

## 2021-06-27 LAB — CBC
HCT: 41.1 % (ref 36.0–46.0)
Hemoglobin: 12.5 g/dL (ref 12.0–15.0)
MCH: 29.6 pg (ref 26.0–34.0)
MCHC: 30.4 g/dL (ref 30.0–36.0)
MCV: 97.2 fL (ref 80.0–100.0)
Platelets: 164 10*3/uL (ref 150–400)
RBC: 4.23 MIL/uL (ref 3.87–5.11)
RDW: 13 % (ref 11.5–15.5)
WBC: 6.7 10*3/uL (ref 4.0–10.5)
nRBC: 0.6 % — ABNORMAL HIGH (ref 0.0–0.2)

## 2021-06-27 LAB — TROPONIN I (HIGH SENSITIVITY): Troponin I (High Sensitivity): 7 ng/L (ref <18)

## 2021-06-27 LAB — RESP PANEL BY RT-PCR (FLU A&B, COVID) ARPGX2
Influenza A by PCR: NEGATIVE
Influenza B by PCR: NEGATIVE
SARS Coronavirus 2 by RT PCR: NEGATIVE

## 2021-06-27 LAB — PROCALCITONIN: Procalcitonin: <0.1 ng/mL

## 2021-06-27 LAB — BRAIN NATRIURETIC PEPTIDE: B Natriuretic Peptide: 114.8 pg/mL — ABNORMAL HIGH (ref 0.0–100.0)

## 2021-06-27 LAB — CBG MONITORING, ED: Glucose-Capillary: 119 mg/dL — ABNORMAL HIGH (ref 70–99)

## 2021-06-27 NOTE — ED Notes (Signed)
Pt moved to [place] as requested by charge nurse. Charge aware that pt is on 2 liters nasal cannula at this time.  ?

## 2021-06-27 NOTE — ED Triage Notes (Signed)
Pt comes into the ED vai EMS from [facility] with c/o staff stating that the pt had a period of unresponsiveness and hypoxic at 67% on 2L [place].. now on 3L [place] 94-9% per EMS, a/ox3 ? ?137/87 ?CO2 45 ?RR28 ?CBG120 ?99.2 temp ?

## 2021-06-27 NOTE — ED Notes (Signed)
Pt still waiting for ride back to facility at this time. Pt sleeping at this time. Pt remains connected to monitor. ?

## 2021-06-27 NOTE — ED Notes (Signed)
EMS called for transport back to facility ?

## 2021-06-27 NOTE — ED Provider Notes (Signed)
? ?[facility] ?Provider Note ? ? ? Event Date/Time  ? First MD Initiated Contact with Patient 06/27/21 1748   ?  (approximate) ? ? ?History  ? ?Altered Mental Status and Shortness of Breath ? ? ?HPI ? ?Katherine [name] is a 81 y.o. female with medical history of PVD, OSA, chronic respiratory failure on 2 L at baseline, GAD, CHF, HTN, CKD, seizure disorder, and recurrent episodes of aphasia most recently admitted for this and discharged on 5/9 patient had a negative stroke work-up nonambulatory at baseline not oriented to date of baseline who presents via EMS from nursing facility for evaluation patient had a brief episode of unresponsiveness associated with hypoxia.  It is not clear how long this lasted.  I did speak with staff at facility who states that had increased patient's oxygen to 15 L.  As far as they are aware she has not had any other recent sick symptoms such as seizures, cough, fevers, vomiting, diarrhea or other recent episodes of unresponsiveness low oxygen that she was recently released from the hospital.  No recent medication changes.  Patient states she feels "good" when asked if he is feeling.  When asked why she is emergency room she states "something was bothering me in my nose".  She is denying any chest pain, abdominal pain, shortness of breath, headache, earache, sore throat, nausea or any other symptoms at this time.  Did speak with husband at bedside who confirms that she is globally weak at baseline and nonambulatory to be oriented to date.  As far as he can tell she is at her neurological baseline. ? ?  ? ? ?Physical Exam  ?Triage Vital Signs: ?ED Triage Vitals  ?Enc Vitals Group  ?   BP 06/27/21 1723 116/67  ?   Pulse Rate 06/27/21 1723 68  ?   Resp 06/27/21 1723 20  ?   Temp 06/27/21 1723 98.5 ?F (36.9 ?C)  ?   Temp Source 06/27/21 1723 Oral  ?   SpO2 06/27/21 1723 93 %  ?   Weight 06/27/21 1706 230 lb (104.3 kg)  ?   Height 06/27/21 1706 5\' 4"  (1.626 m)  ?   Head  Circumference --   ?   Peak Flow --   ?   Pain Score --   ?   Pain Loc --   ?   Pain Edu? --   ?   Excl. in [place]? --   ? ? ?Most recent vital signs: ?Vitals:  ? 06/27/21 2040 06/27/21 2045  ?BP:  (!) 125/97  ?Pulse:  71  ?Resp:  (!) 21  ?Temp:    ?SpO2: 96% 97%  ? ? ?General: Awake, no distress.  ?CV:  Good peripheral perfusion.  2+ radial pulses. ?Resp:  Normal effort.  Clear bilaterally. ?Abd:  No distention.  Soft. ?Other:  Is not oriented but able to speak clearly.  She is able to give this examiner strong grip with both hands move her toes and left knee.  Patient is intact light touch all extremities.  No significant lower extremity edema. ? ? ?ED Results / Procedures / Treatments  ?Labs ?(all labs ordered are listed, but only abnormal results are displayed) ?Labs Reviewed  ?COMPREHENSIVE METABOLIC PANEL - Abnormal; Notable for the following components:  ?    Result Value  ? Glucose, Bld 117 (*)   ? BUN 28 (*)   ? Creatinine, Ser 1.87 (*)   ? Albumin 3.3 (*)   ?  AST 13 (*)   ? GFR, Estimated 27 (*)   ? All other components within normal limits  ?CBC - Abnormal; Notable for the following components:  ? nRBC 0.6 (*)   ? All other components within normal limits  ?BRAIN NATRIURETIC PEPTIDE - Abnormal; Notable for the following components:  ? B Natriuretic Peptide 114.8 (*)   ? All other components within normal limits  ?CBG MONITORING, ED - Abnormal; Notable for the following components:  ? Glucose-Capillary 119 (*)   ? All other components within normal limits  ?RESP PANEL BY RT-PCR (FLU A&B, COVID) [id number]  ?PROCALCITONIN  ?TROPONIN I (HIGH SENSITIVITY)  ?TROPONIN I (HIGH SENSITIVITY)  ? ? ? ?EKG ? ?EKG is normal sinus rhythm with a rate of 68, left axis deviation, nonspecific ST changes in leads without other clear evidence of acute ischemia or significant arrhythmia. ? ? ?RADIOLOGY ? ?Chest x-ray my interpretation shows no focal consolidation, pneumothorax, large effusion or overt  edema. ? ? ?PROCEDURES: ? ?Critical Care performed: No ? ?.1-3 Lead EKG Interpretation ?Performed by: Lucrezia Starch, MD ?Authorized by: Lucrezia Starch, MD  ? ?  Interpretation: normal   ?  ECG rate assessment: normal   ?  Rhythm: sinus rhythm   ?  Ectopy: none   ?  Conduction: normal   ? ?The patient is on the cardiac monitor to evaluate for evidence of arrhythmia and/or significant heart rate changes ? ? ?MEDICATIONS ORDERED IN ED: ?Medications - No data to display ? ? ?IMPRESSION / MDM / ASSESSMENT AND PLAN / ED COURSE  ?I reviewed the triage vital signs and the nursing notes. ?             ?               ? ?Differential diagnosis includes, but is not limited to pneumothorax, seizure, arrhythmia, allergic reaction.  Clinical picture is not suggestive of CVA trauma.  Low suspicion for toxic ingestion.  He denies any shortness of breath on my assessment has no evidence of hypoxic arterial failure on chronic 2 L to suggest a PE. ? ?EKG is normal sinus rhythm with a rate of 68, left axis deviation, nonspecific ST changes in leads without other clear evidence of acute ischemia or significant arrhythmia. ? ?Chest x-ray my interpretation shows no focal consolidation, pneumothorax, large effusion or overt edema. ? ?COVID influenza PCR negative.  Procalcitonin is negative.  BNP is 114 and overall patient does not appear volume overload suspicion for acute heart failure.  She without leukocytosis or acute anemia.  He without any significant lecture metabolic derangements and kidney function of 1.7 very close to baseline.  No other significant lecture metabolic derangements today.  ECG and nonelevated troponin are not suggestive of ACS. ? ?Was observed for approximately 4 hours and had no episodes of hypoxia on home 2 L.  Occasionally dozed off without any hypoxia and is easily arousable each time I woke her up she states she "felt good".  At this point is not exactly clear what happened earlier today although given  she does not have any evidence of acute on chronic respiratory failure with very reassuring work-up and low suspicion for other immediate life-threatening process at this time I think is stable for discharge continue PCP evaluation.  Discharged in stable condition precautions advised and discussed. ? ?  ? ? ?FINAL CLINICAL IMPRESSION(S) / ED DIAGNOSES  ? ?Final diagnoses:  ?SOB (shortness of breath)  ? ? ? ?Rx /  DC Orders  ? ?ED Discharge Orders   ? ? None  ? ?  ? ? ? ?Note:  This document was prepared using Dragon voice recognition software and may include unintentional dictation errors. ?  ?Lucrezia Starch, MD ?06/27/21 2049 ? ?

## 2021-06-27 NOTE — ED Notes (Signed)
EMS here to pick up pt and bring back to facility. Facility aware that she will be arriving. Pt leaves via stretcher with EMS in no acute distress.  ?

## 2021-06-28 DIAGNOSIS — S30810D Abrasion of lower back and pelvis, subsequent encounter: Secondary | ICD-10-CM | POA: Diagnosis not present

## 2021-06-30 IMAGING — CT CT HEAD W/O CM
3 series · 15 of 46 positions shown, 18 images · non-contrast
Comparison: 09/06/2018

CLINICAL DATA: Headache, on Eliquis

EXAM:
CT HEAD WITHOUT CONTRAST
TECHNIQUE: Contiguous axial images were obtained from the base of the skull
through the vertex without intravenous contrast.

[Series 3: head wo · axial · 0.43mm/px · z∈[-27,+93]mm · 9 of 29 slices shown, 12 images]
[im 3/29  brain]
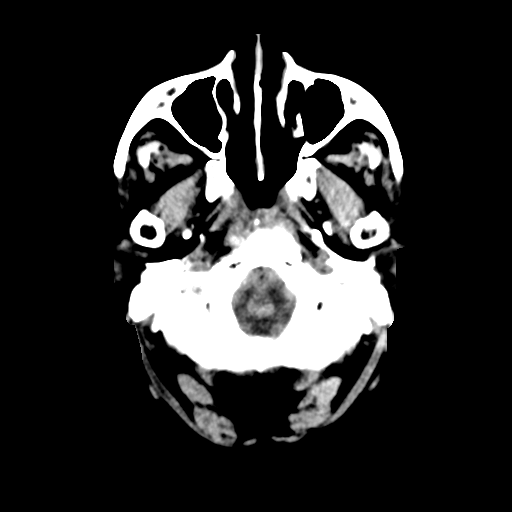
[im 3/29  bone]
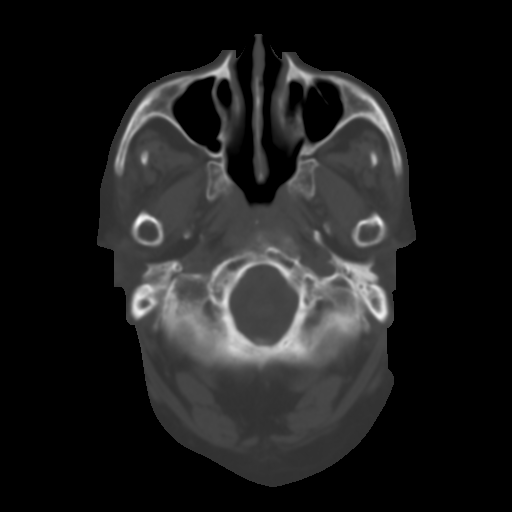
[im 6/29  brain]
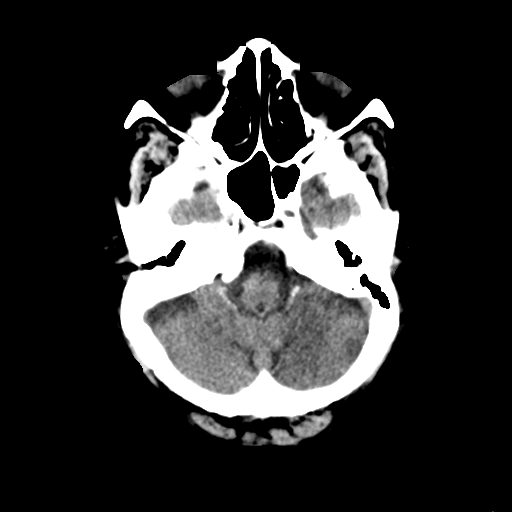
[im 9/29  brain]
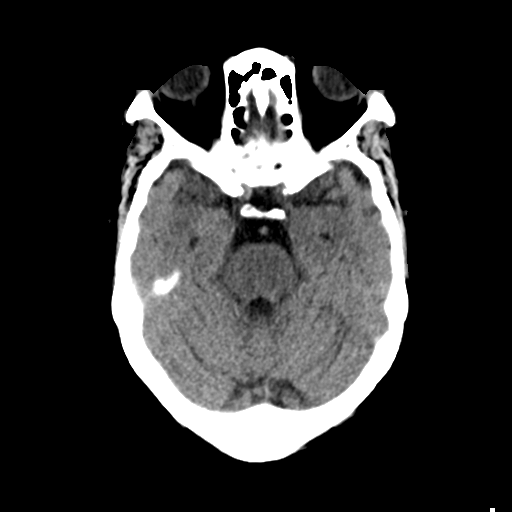
[im 12/29  brain]
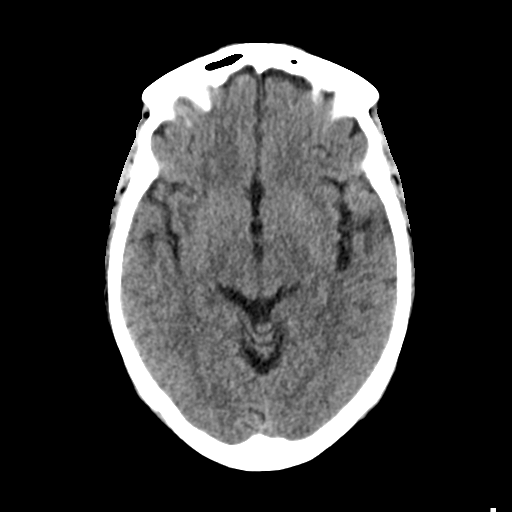
[im 15/29  brain]
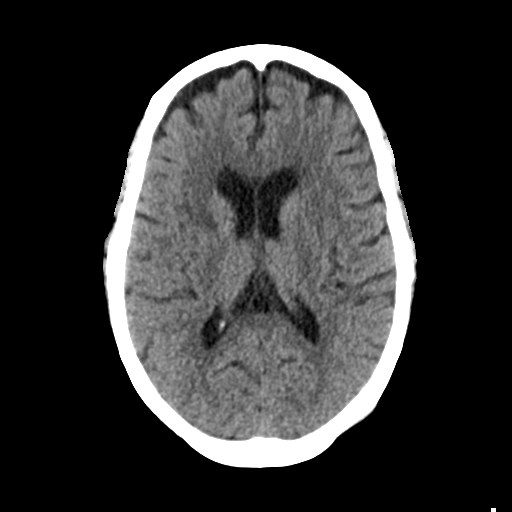
[im 15/29  bone]
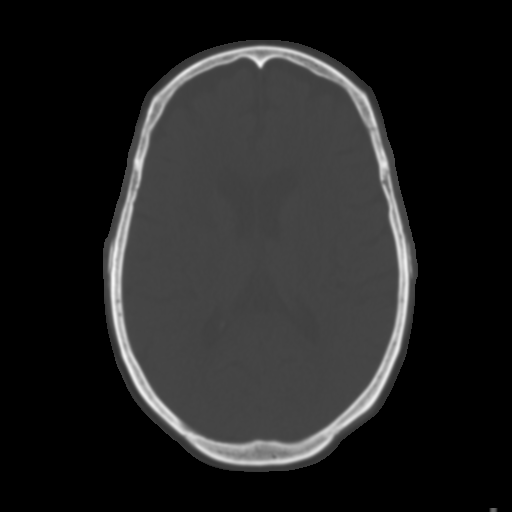
[im 18/29  brain]
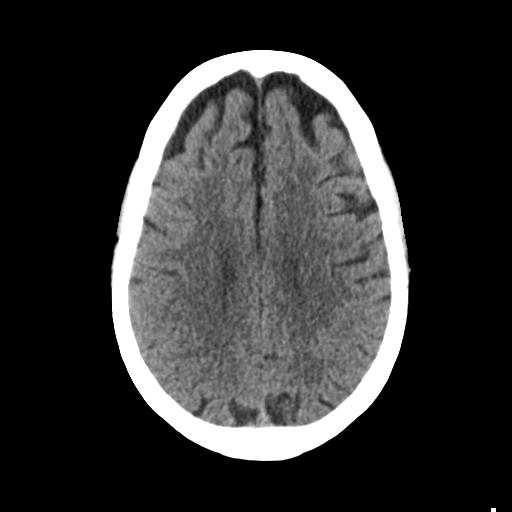
[im 21/29  brain]
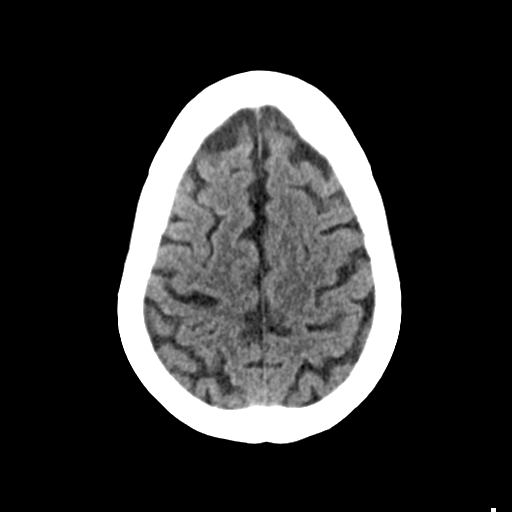
[im 24/29  brain]
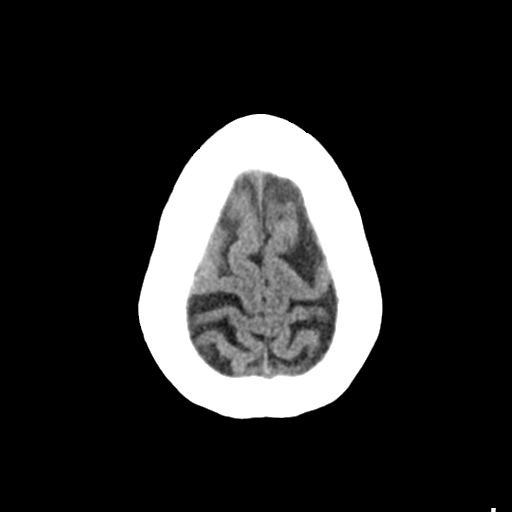
[im 27/29  brain]
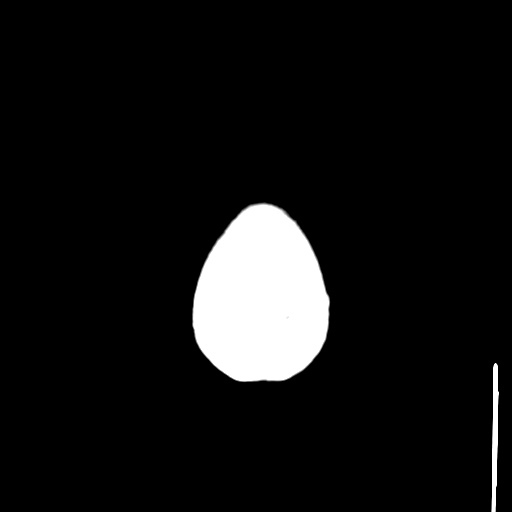
[im 27/29  bone]
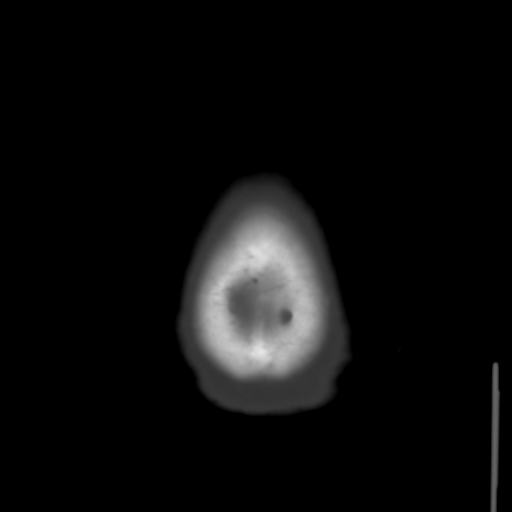

[Series 4: coronal soft tissue · coronal · 0.29mm/px · 3 of 67 slices shown]
[im 23/67  brain]
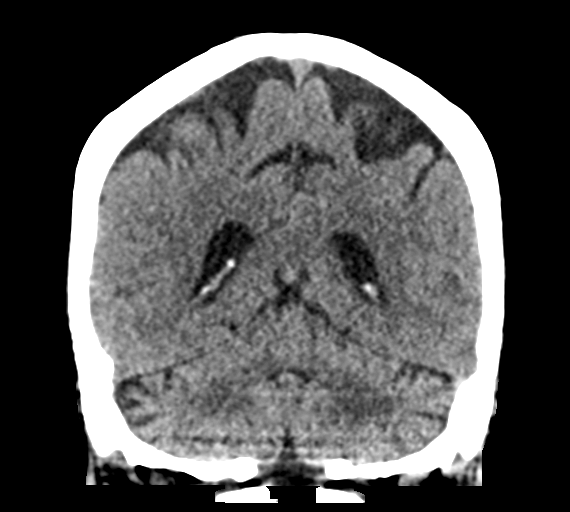
[im 30/67  brain]
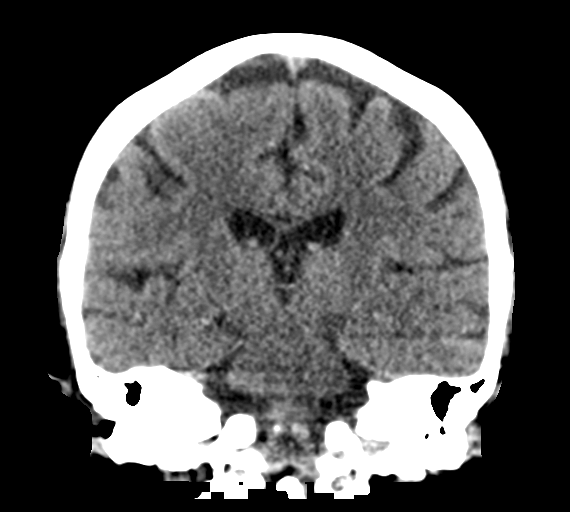
[im 37/67  brain]
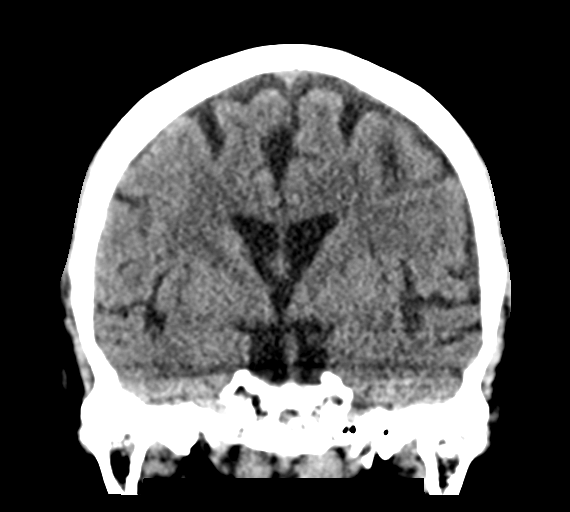

[Series 5: sagittal soft tissue · sagittal · 0.31mm/px · 3 of 50 slices shown]
[im 17/50  brain]
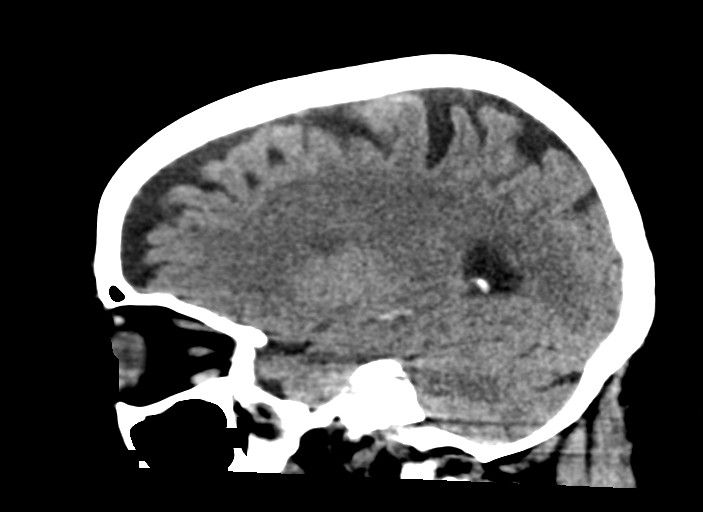
[im 25/50  brain]
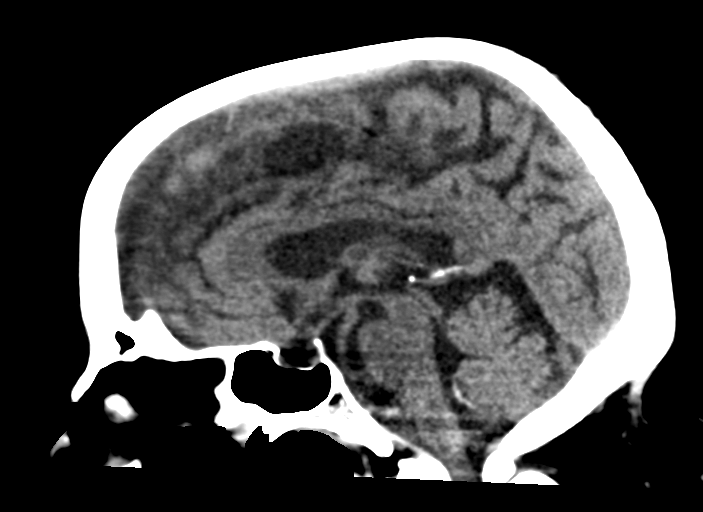
[im 33/50  brain]
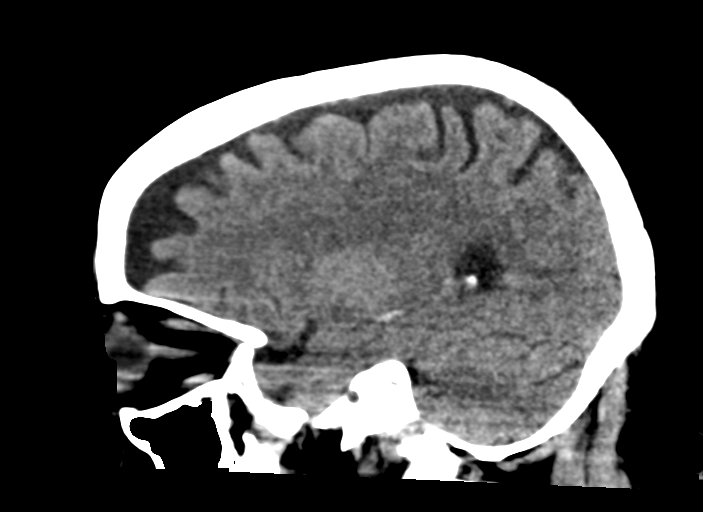

[15 of 46 positions shown; findings below may reference images not displayed]

FINDINGS: Brain: Patchy chronic small vessel disease throughout the deep white
matter. No acute intracranial abnormality. Specifically, no
hemorrhage, hydrocephalus, mass lesion, acute infarction, or
significant intracranial injury.

Vascular: No hyperdense vessel or unexpected calcification.

Skull: No acute calvarial abnormality.

Sinuses/Orbits: Visualized paranasal sinuses and mastoids clear.
Orbital soft tissues unremarkable.

Other: None
IMPRESSION: Chronic small vessel disease changes throughout the deep white
matter. No acute intracranial abnormality.

## 2021-06-30 IMAGING — CR DG CHEST 2V
1 series · 2 of 2 positions shown · non-contrast
Comparison: 11/24/2018

CLINICAL DATA: Hemoptysis, cough

EXAM:
CHEST - 2 VIEW

[Series 1: dg chest 2 view · 0.14mm/px · 2 of 2 slices shown]
[im 1/2]
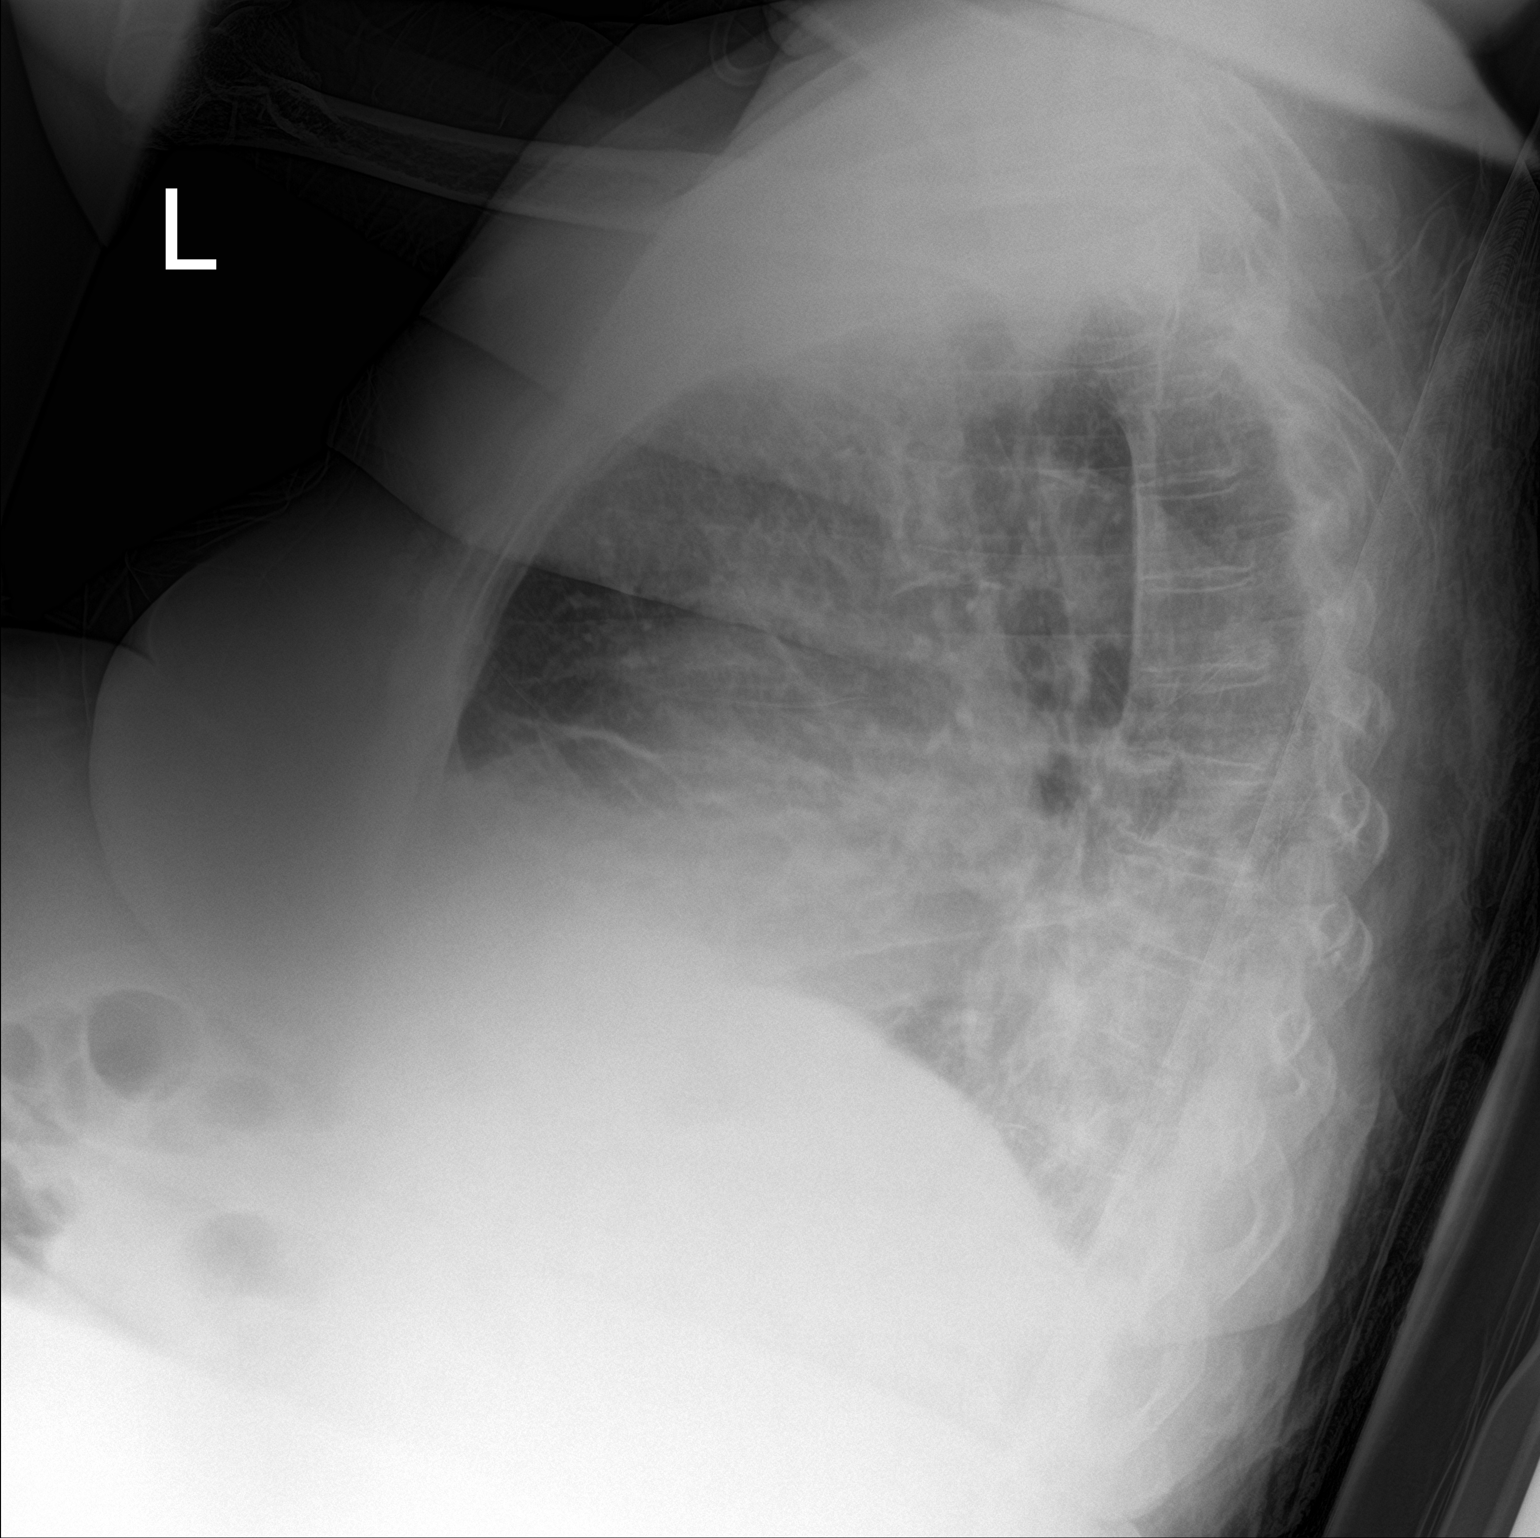
[im 2/2]
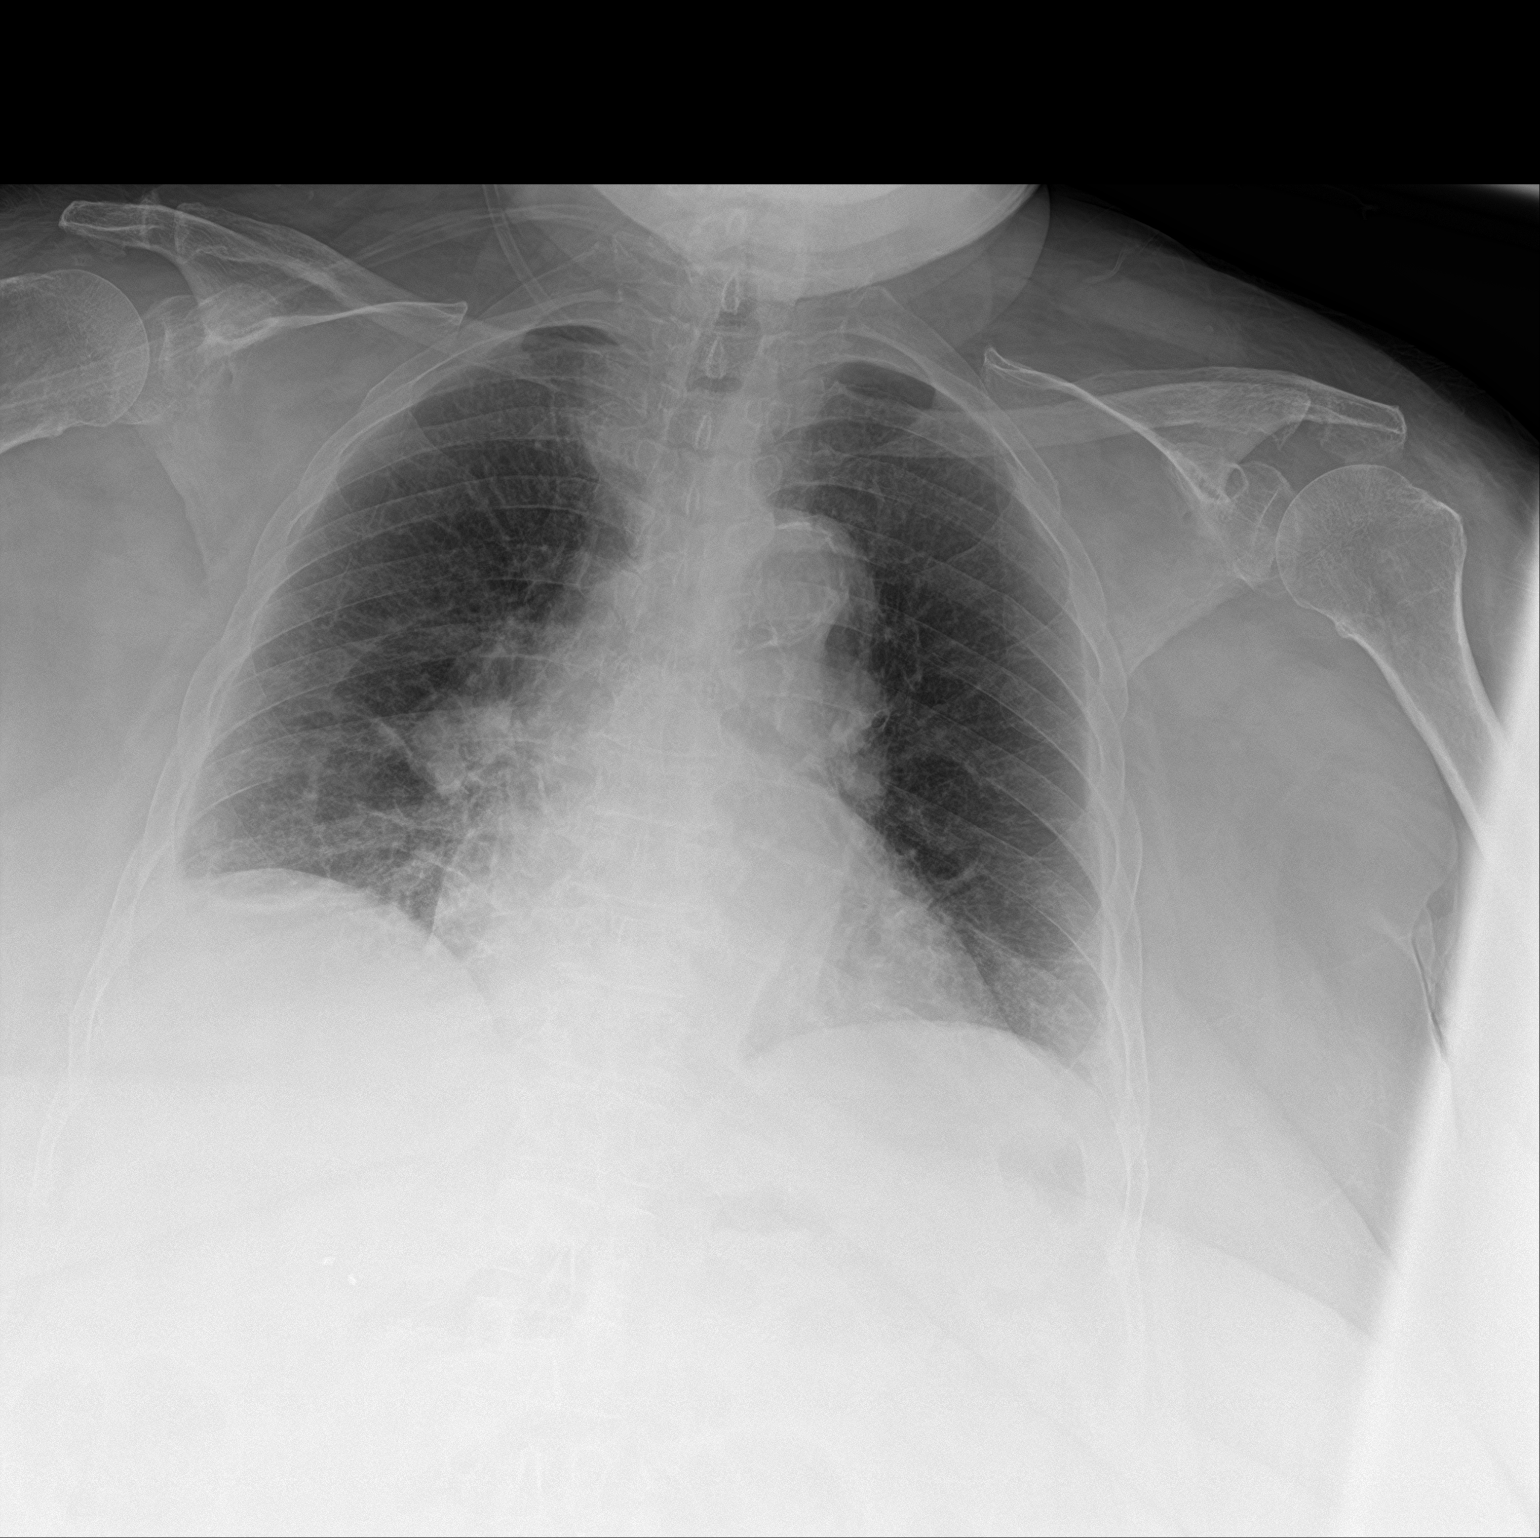

[2 of 2 positions shown; findings below may reference images not displayed]

FINDINGS: Increased markings in the lower lungs, likely scarring, similar to
prior study. Heart is borderline in size. Aortic atherosclerosis. No
acute airspace opacities or effusions. No acute bony abnormality.
IMPRESSION: Bibasilar linear densities, likely scarring. No definite acute
process.

## 2021-07-01 DIAGNOSIS — R5381 Other malaise: Secondary | ICD-10-CM | POA: Diagnosis not present

## 2021-07-01 DIAGNOSIS — G40909 Epilepsy, unspecified, not intractable, without status epilepticus: Secondary | ICD-10-CM | POA: Diagnosis not present

## 2021-07-01 IMAGING — CT CT ANGIO CHEST
2 of 6 series · 18 of 46 positions shown · IV contrast (APPLIED)
Comparison: 11/21/2018

CLINICAL DATA: Nose bleed.  Hemoptysis.

EXAM:
CT ANGIOGRAPHY CHEST WITH CONTRAST
TECHNIQUE: Multidetector CT imaging of the chest was performed using the
standard protocol during bolus administration of intravenous
contrast. Multiplanar CT image reconstructions and MIPs were
obtained to evaluate the vascular anatomy.
CONTRAST:  60mL OMNIPAQUE IOHEXOL 350 MG/ML SOLN

[Series 5: thins · axial · 0.66mm/px · z∈[-586,-366]mm · 16 of 242 slices shown]
[im 11/242  lung]
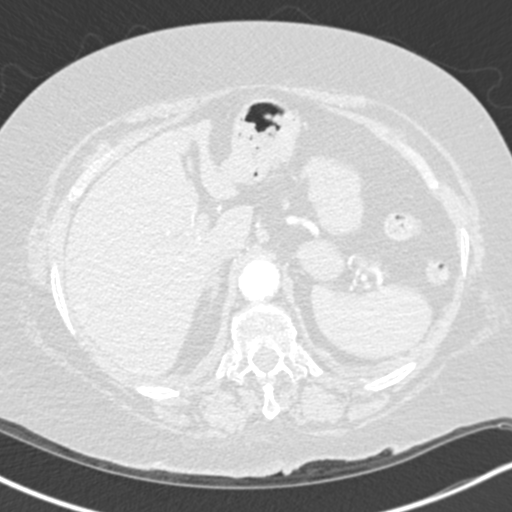
[im 32/242  soft-tissue]
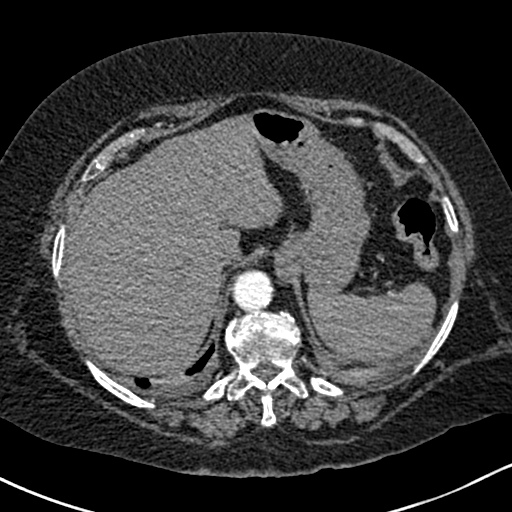
[im 42/242  lung]
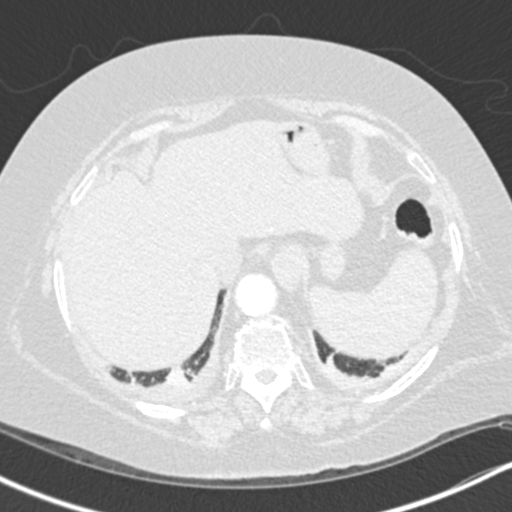
[im 53/242  soft-tissue]
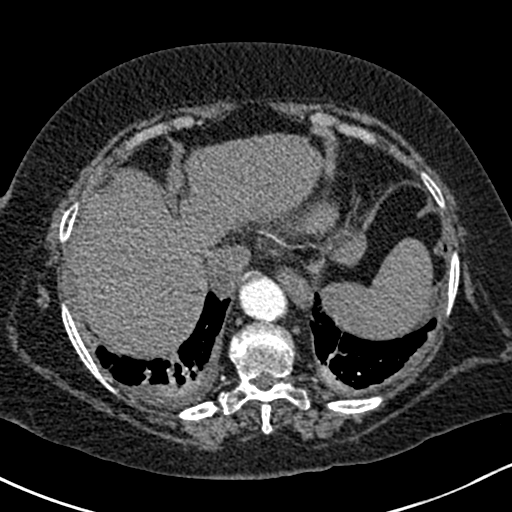
[im 74/242  lung]
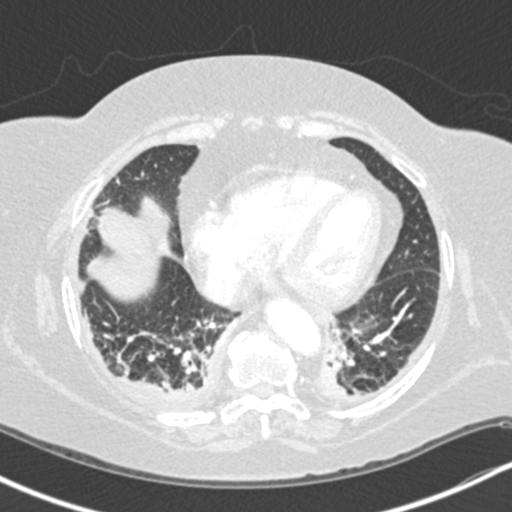
[im 84/242  soft-tissue]
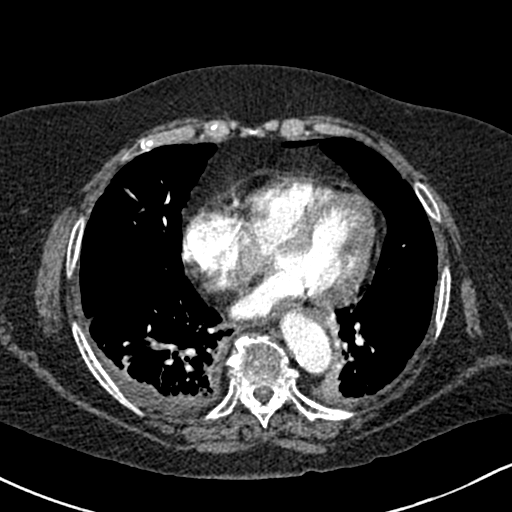
[im 95/242  lung]
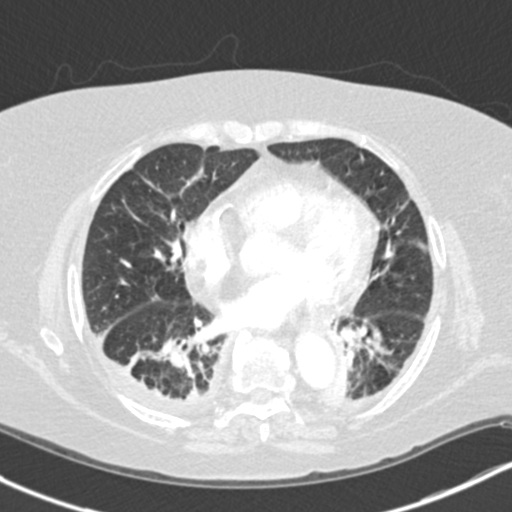
[im 116/242  soft-tissue]
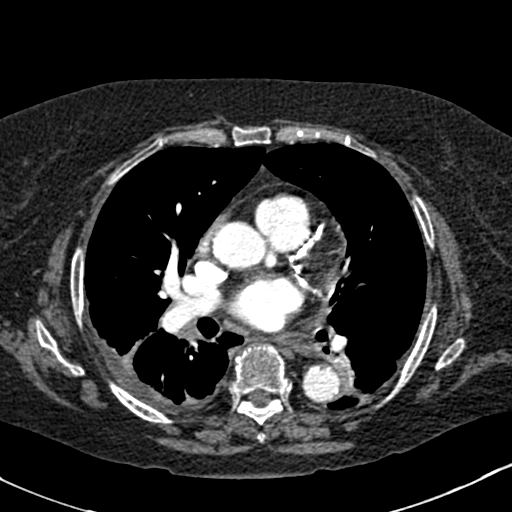
[im 126/242  lung]
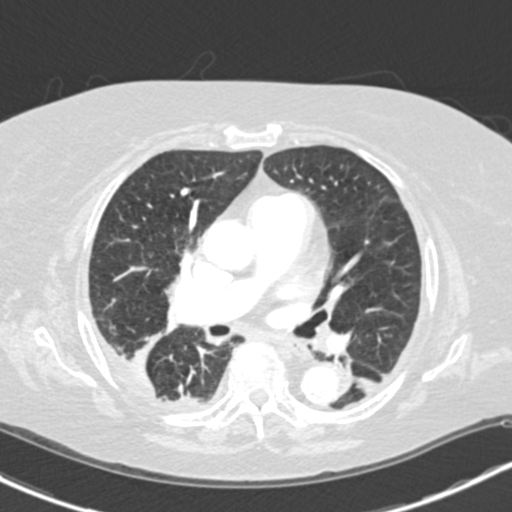
[im 147/242  soft-tissue]
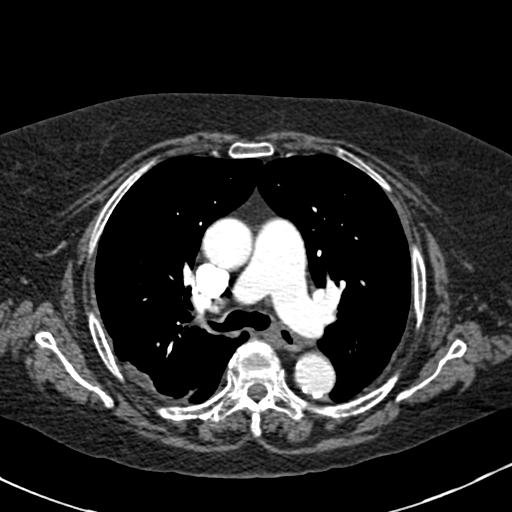
[im 158/242  lung]
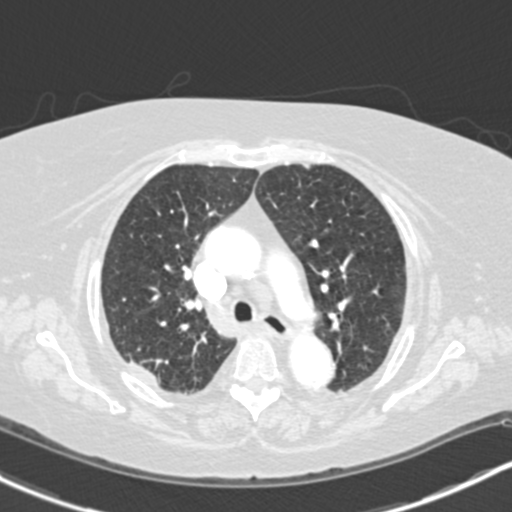
[im 168/242  soft-tissue]
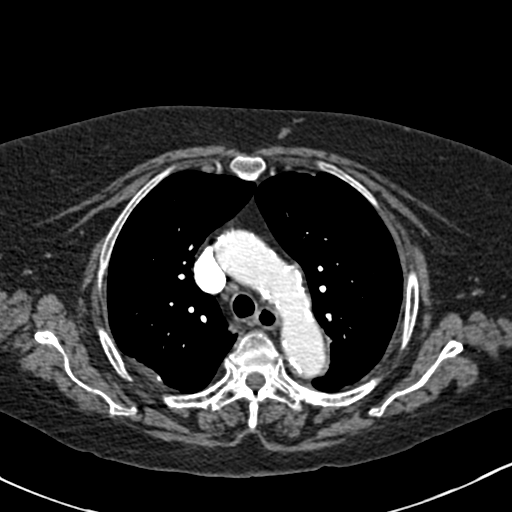
[im 189/242  lung]
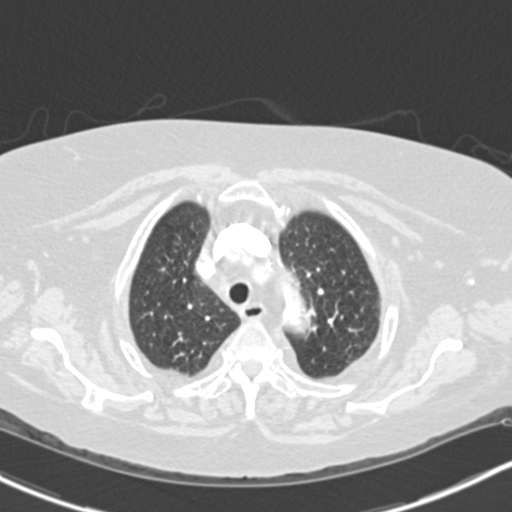
[im 200/242  soft-tissue]
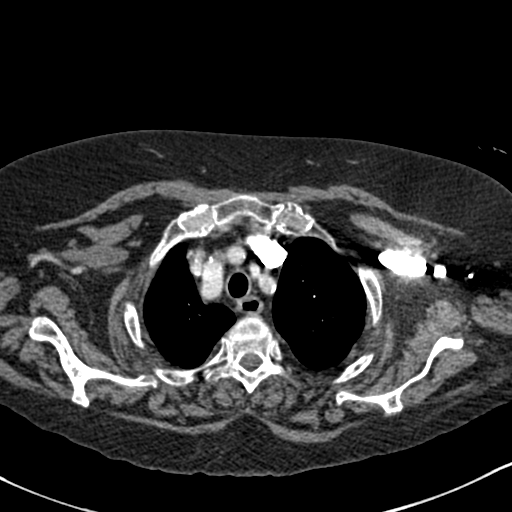
[im 210/242  lung]
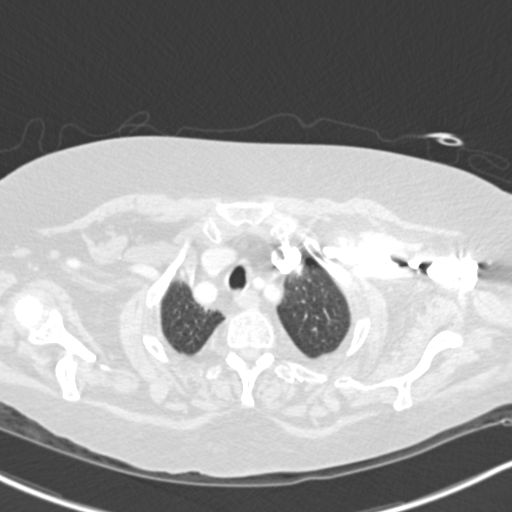
[im 231/242  soft-tissue]
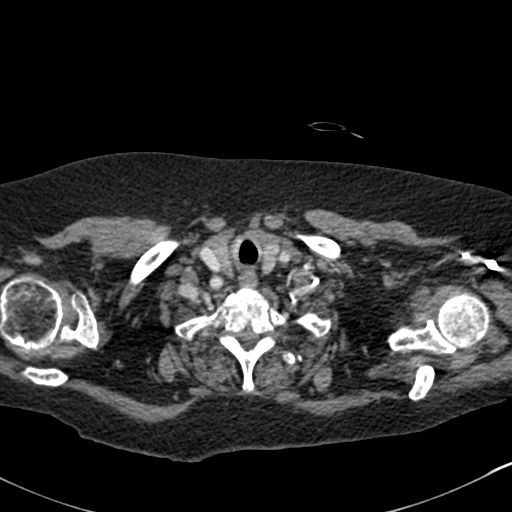

[Series 7: coronal mpr · coronal · 0.52mm/px · 2 of 72 slices shown]
[im 24/72  soft-tissue]
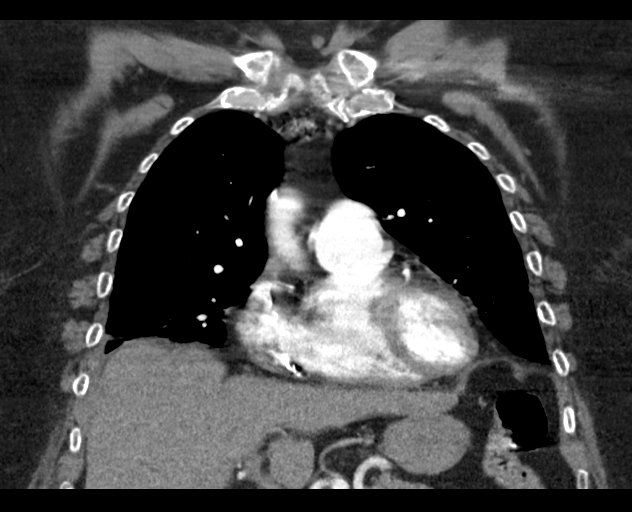
[im 48/72  soft-tissue]
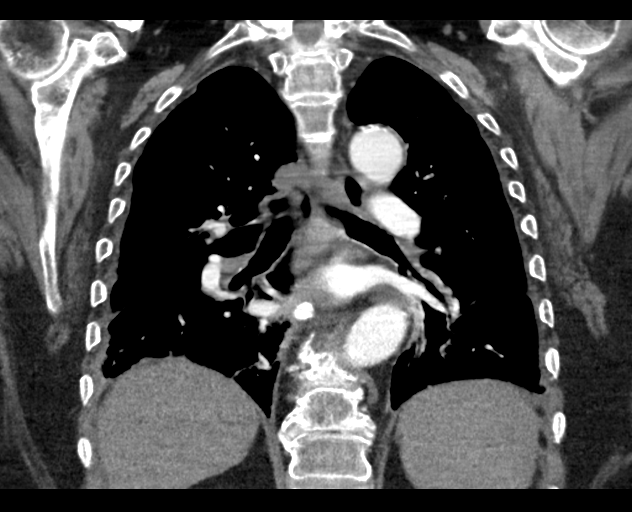

[18 of 46 positions shown; findings below may reference images not displayed]

FINDINGS: Cardiovascular: Contrast injection is sufficient to demonstrate
satisfactory opacification of the pulmonary arteries to the
segmental level.Again identified are small subsegmental pulmonary
emboli involving the left lower lobe, slightly improved from prior
study. The main pulmonary artery is dilated measuring approximately
3.3 cm. Aortic calcifications are noted. The heart size is stable.
Coronary artery calcifications are noted.

Mediastinum/Nodes:

--No mediastinal or hilar lymphadenopathy.

--No axillary lymphadenopathy.

--No supraclavicular lymphadenopathy.

--Normal thyroid gland.

--The esophagus is unremarkable

Lungs/Pleura: There are worsening bibasilar airspace opacities.
There is no pneumothorax. There are trace bilateral pleural
effusions, right greater than left. The trachea is unremarkable.

Upper Abdomen: No acute abnormality.

Musculoskeletal: No chest wall abnormality. No acute or significant
osseous findings.

Review of the MIP images confirms the above findings.
IMPRESSION: 1. Improving subsegmental pulmonary emboli involving the left lower
lobe.
2. Worsening bibasilar airspace opacities which may represent
atelectasis or infiltrates.
3. Trace bilateral pleural effusions.
4. Dilated main pulmonary artery which can be seen in patients with
elevated pulmonary artery pressures.

Aortic Atherosclerosis ([id number]).

## 2021-07-04 DIAGNOSIS — R262 Difficulty in walking, not elsewhere classified: Secondary | ICD-10-CM | POA: Diagnosis not present

## 2021-07-04 DIAGNOSIS — J9622 Acute and chronic respiratory failure with hypercapnia: Secondary | ICD-10-CM | POA: Diagnosis not present

## 2021-07-04 DIAGNOSIS — I5032 Chronic diastolic (congestive) heart failure: Secondary | ICD-10-CM | POA: Diagnosis not present

## 2021-07-04 DIAGNOSIS — J449 Chronic obstructive pulmonary disease, unspecified: Secondary | ICD-10-CM | POA: Diagnosis not present

## 2021-07-05 DIAGNOSIS — E7849 Other hyperlipidemia: Secondary | ICD-10-CM | POA: Diagnosis not present

## 2021-07-05 DIAGNOSIS — E662 Morbid (severe) obesity with alveolar hypoventilation: Secondary | ICD-10-CM | POA: Diagnosis not present

## 2021-07-05 DIAGNOSIS — E1122 Type 2 diabetes mellitus with diabetic chronic kidney disease: Secondary | ICD-10-CM | POA: Diagnosis not present

## 2021-07-05 DIAGNOSIS — S80811D Abrasion, right lower leg, subsequent encounter: Secondary | ICD-10-CM | POA: Diagnosis not present

## 2021-07-05 IMAGING — CT CT HEAD W/O CM
2 of 3 series · 16 of 30 positions shown, 20 images · non-contrast
Comparison: 12/19/2018

CLINICAL DATA: Altered level of consciousness

EXAM:
CT HEAD WITHOUT CONTRAST
TECHNIQUE: Contiguous axial images were obtained from the base of the skull
through the vertex without intravenous contrast.

[Series 2: routine head w/o · axial · non-contrast · 0.45mm/px · z∈[+538,+567]mm · 2 of 34 slices shown]
[im 6/34  brain]
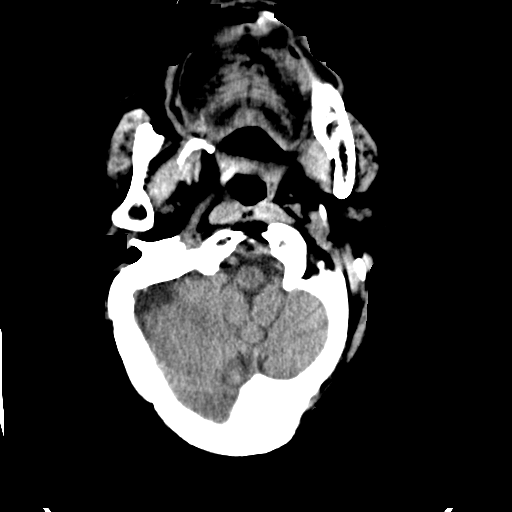
[im 12/34  brain]
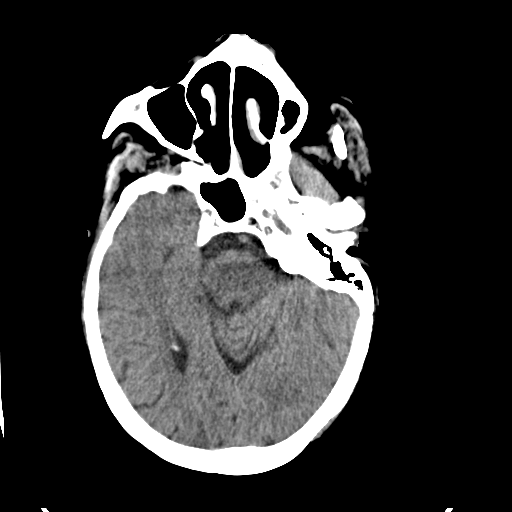

[Series 602: new axial · axial · 0.45mm/px · z∈[+529,+651]mm · 14 of 76 slices shown, 18 images]
[im 6/76  brain]
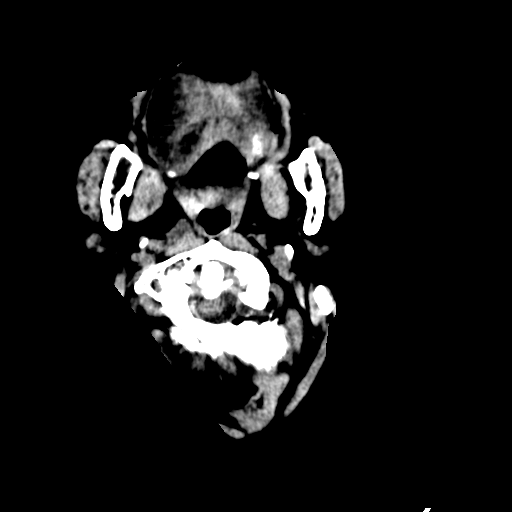
[im 6/76  bone]
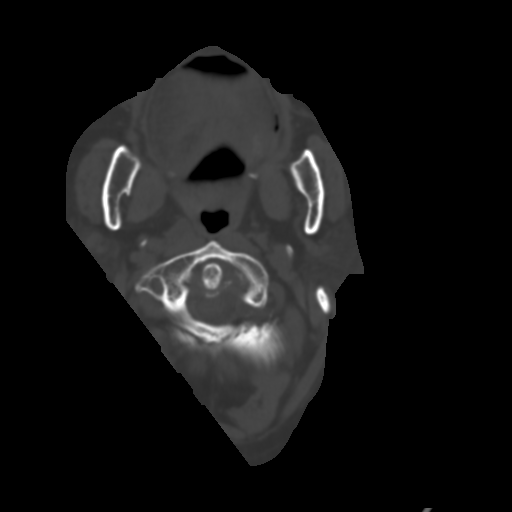
[im 11/76  brain]
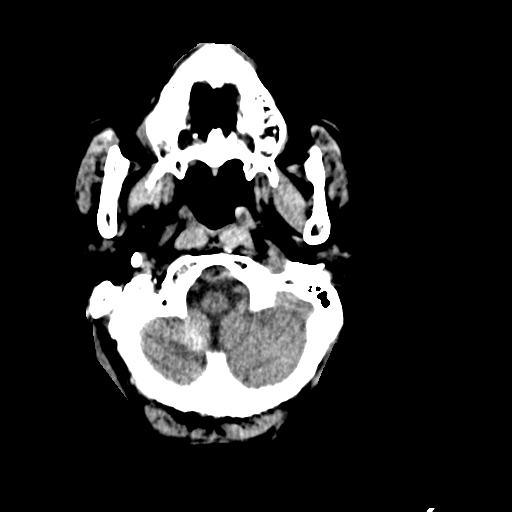
[im 16/76  brain]
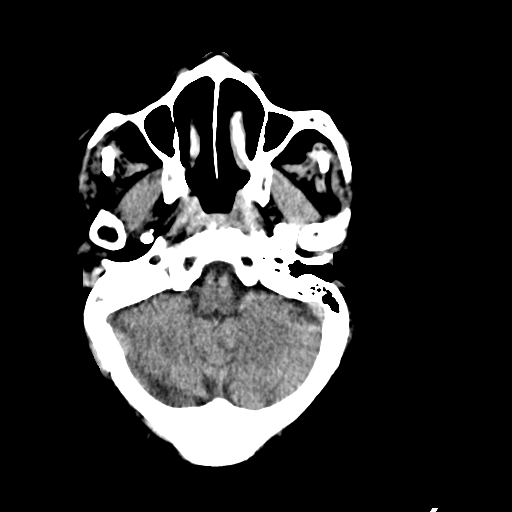
[im 21/76  brain]
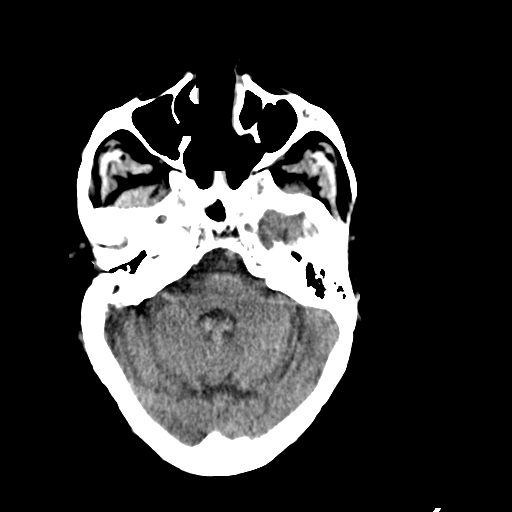
[im 26/76  brain]
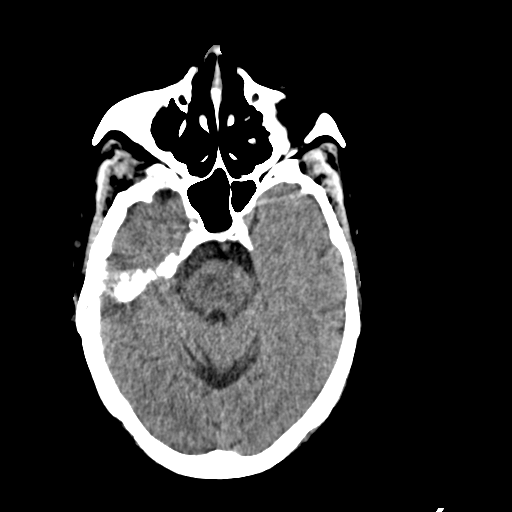
[im 26/76  bone]
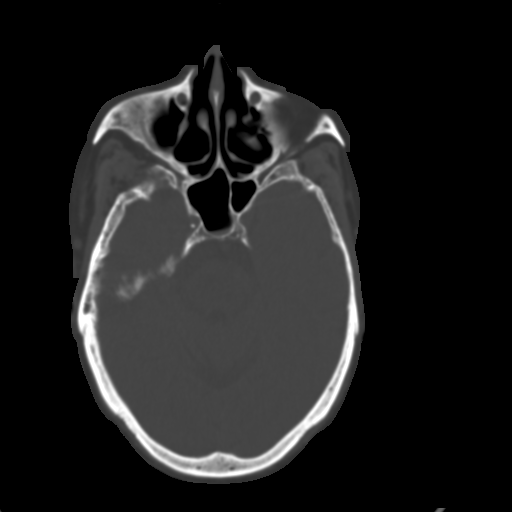
[im 31/76  brain]
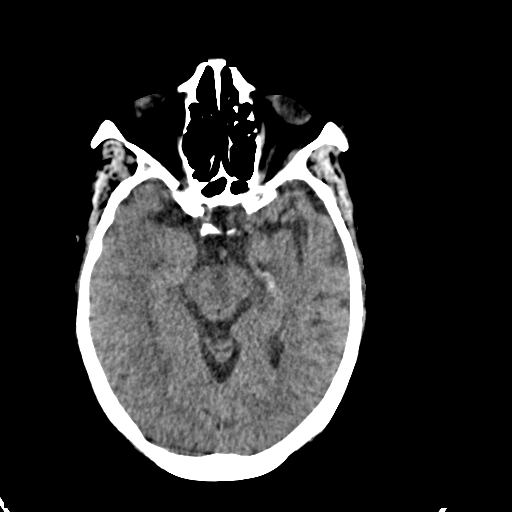
[im 36/76  brain]
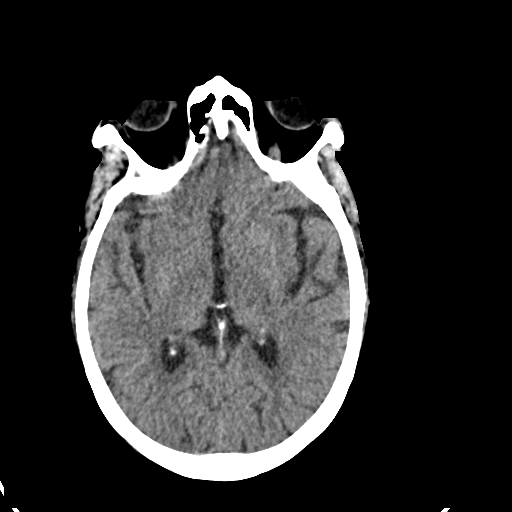
[im 41/76  brain]
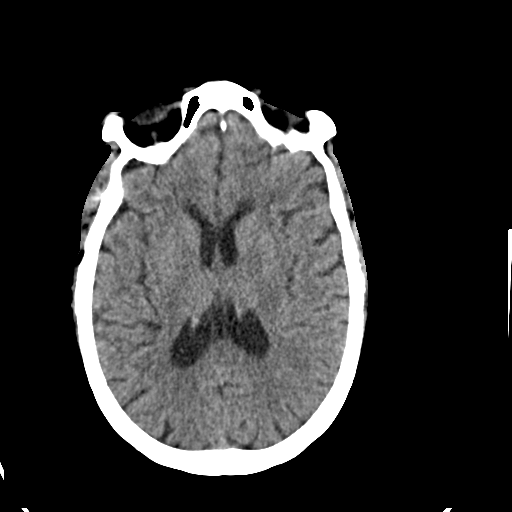
[im 46/76  brain]
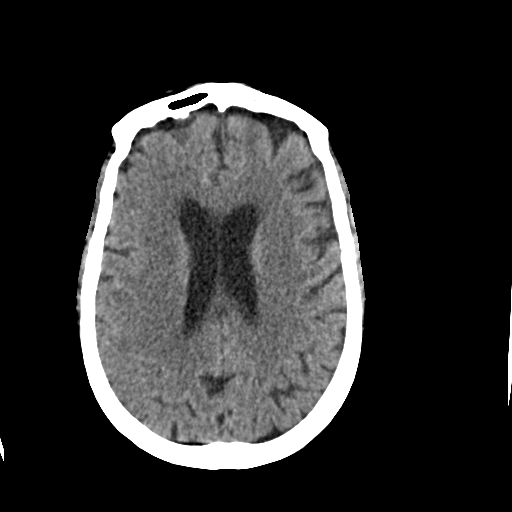
[im 46/76  bone]
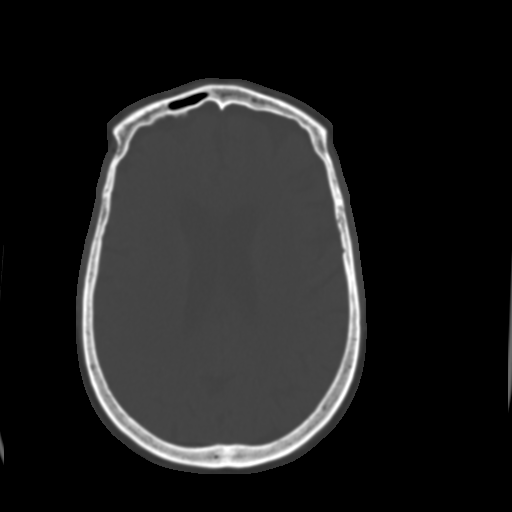
[im 51/76  brain]
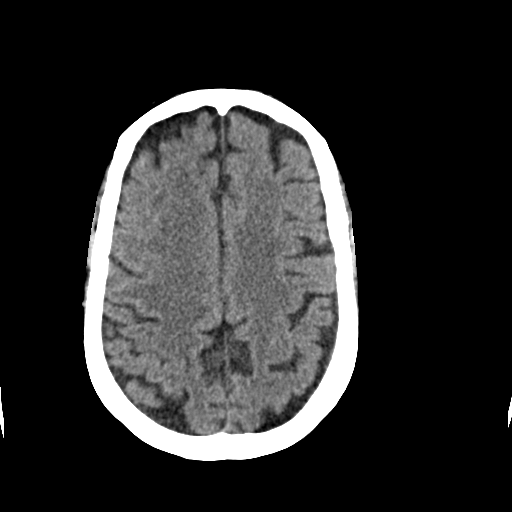
[im 56/76  brain]
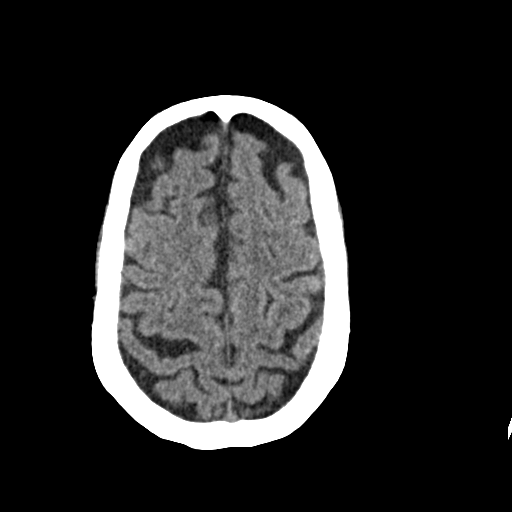
[im 61/76  brain]
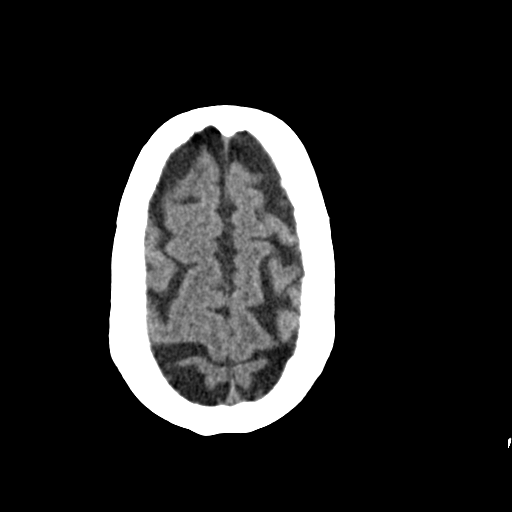
[im 66/76  brain]
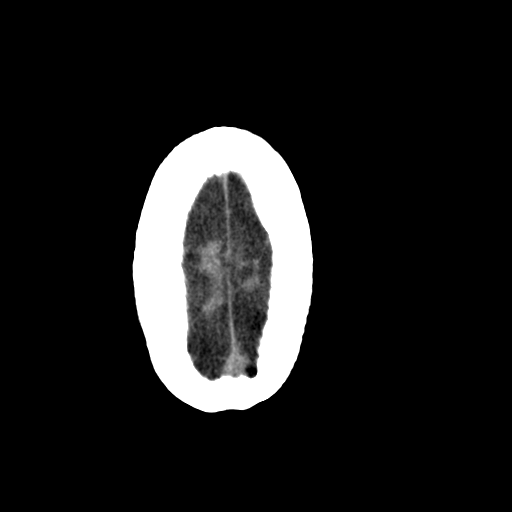
[im 66/76  bone]
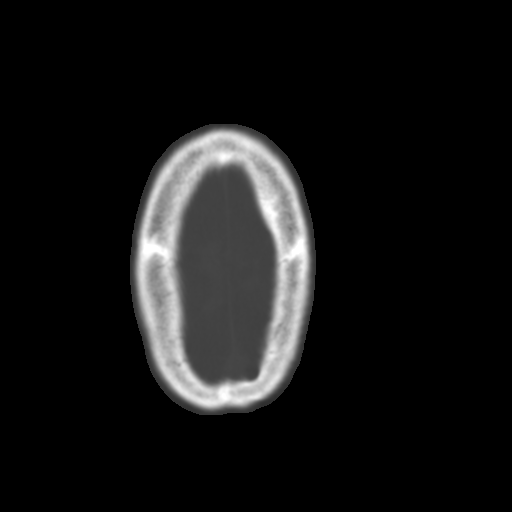
[im 71/76  brain]
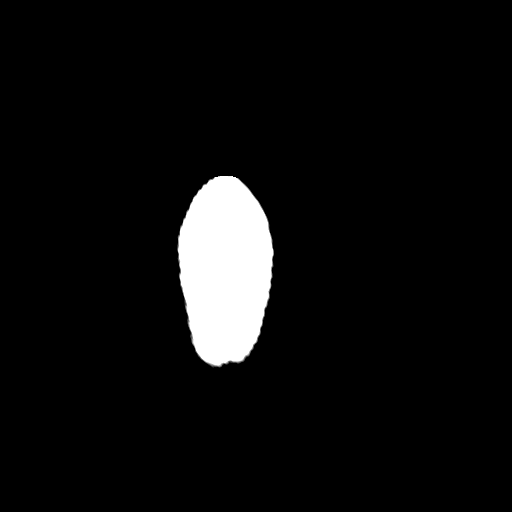

[16 of 30 positions shown; findings below may reference images not displayed]

FINDINGS: Brain: No evidence of acute infarction, hemorrhage, hydrocephalus,
extra-axial collection or mass lesion/mass effect. Mild
periventricular white matter hypodensity.

Vascular: No hyperdense vessel or unexpected calcification.

Skull: Normal. Negative for fracture or focal lesion.

Sinuses/Orbits: No acute finding.

Other: None.
IMPRESSION: No acute intracranial pathology.  Small-vessel white matter disease.

## 2021-07-06 DIAGNOSIS — N184 Chronic kidney disease, stage 4 (severe): Secondary | ICD-10-CM | POA: Diagnosis not present

## 2021-07-06 DIAGNOSIS — J9622 Acute and chronic respiratory failure with hypercapnia: Secondary | ICD-10-CM | POA: Diagnosis not present

## 2021-07-06 DIAGNOSIS — J449 Chronic obstructive pulmonary disease, unspecified: Secondary | ICD-10-CM | POA: Diagnosis not present

## 2021-07-06 DIAGNOSIS — I5032 Chronic diastolic (congestive) heart failure: Secondary | ICD-10-CM | POA: Diagnosis not present

## 2021-07-06 DIAGNOSIS — M6281 Muscle weakness (generalized): Secondary | ICD-10-CM | POA: Diagnosis not present

## 2021-07-06 DIAGNOSIS — R262 Difficulty in walking, not elsewhere classified: Secondary | ICD-10-CM | POA: Diagnosis not present

## 2021-07-07 IMAGING — DX DG CHEST 1V PORT
1 series · 1 of 1 positions shown · non-contrast
Comparison: CTA chest, 12/20/2018. Chest radiographs, 12/19/2018
and older exams.

CLINICAL DATA: Shortness of breath.

EXAM:
PORTABLE CHEST 1 VIEW

[chest]
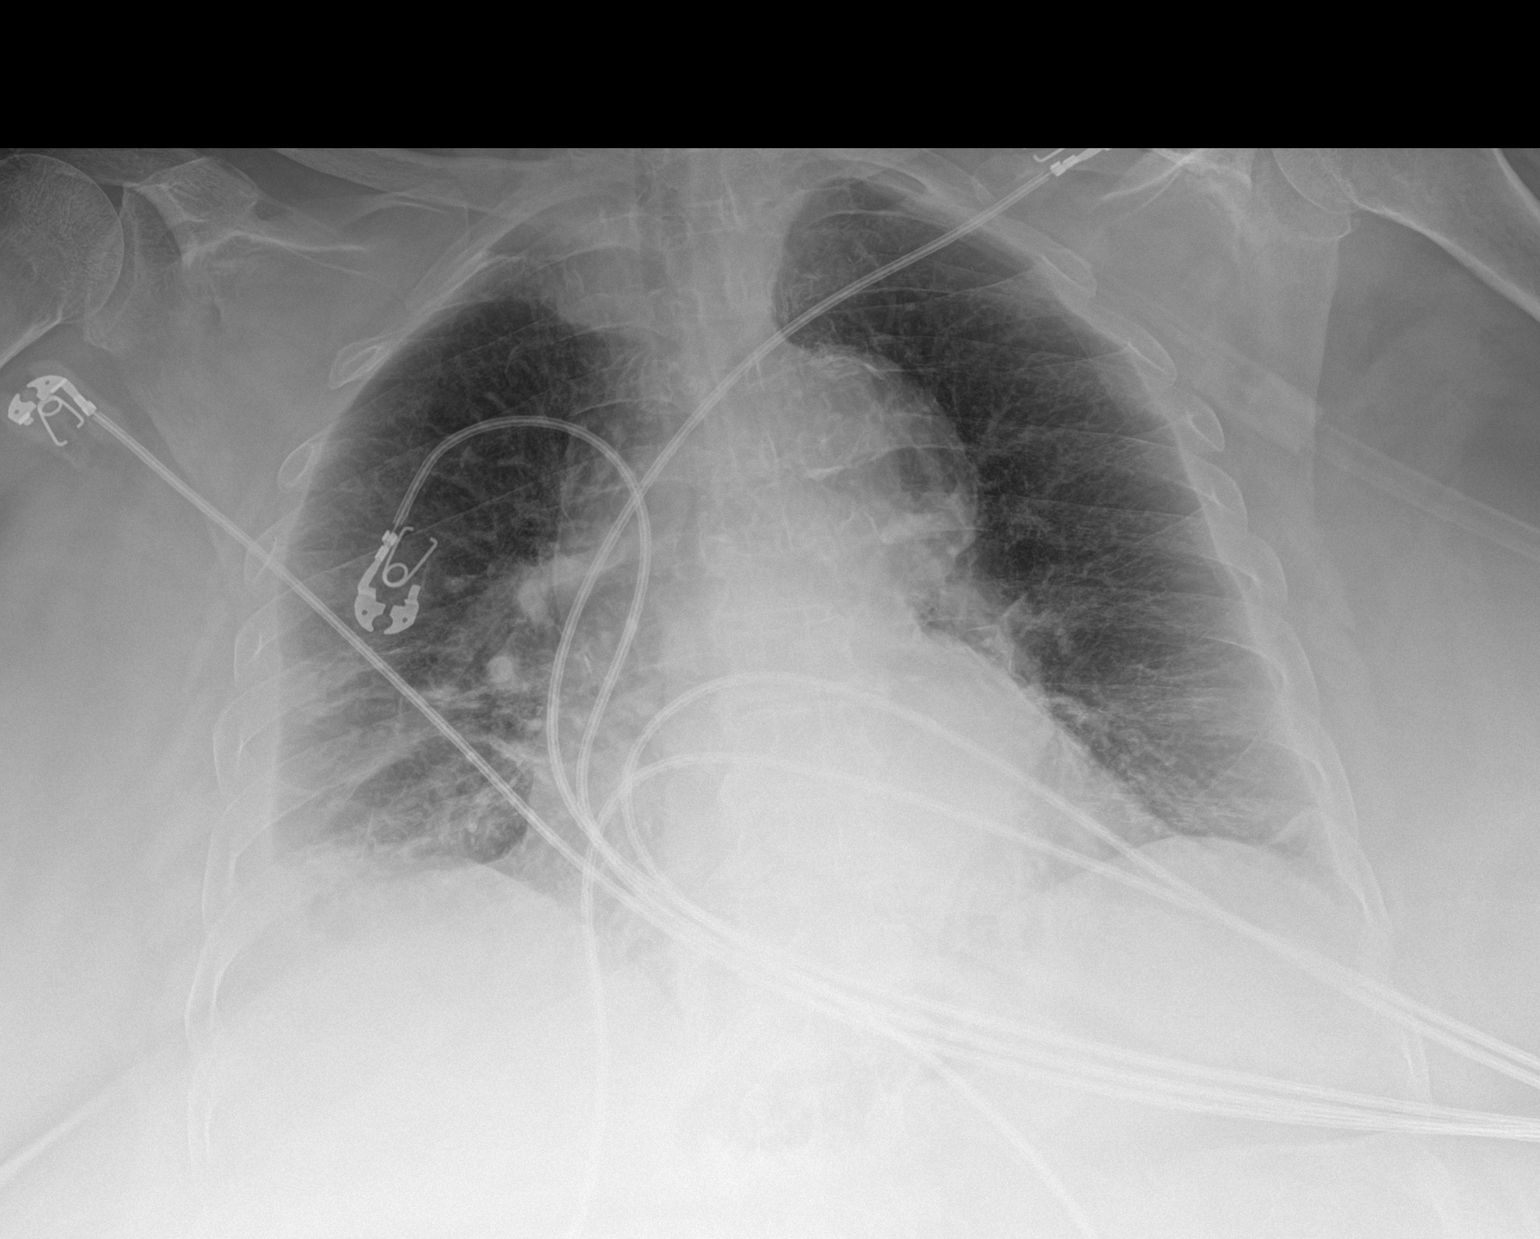

[1 of 1 positions shown; findings below may reference images not displayed]

FINDINGS: Cardiac silhouette is borderline to mildly enlarged. Aorta shows
atherosclerotic calcifications and is prominent. No mediastinal or
hilar masses.

There are linear/reticular lung base opacities, greater on the
right, also noted in the right mid lung, likely due to atelectasis.
Small pleural effusions are suggested, which were noted on the prior
chest CT. Stable prominent bronchovascular markings. No convincing
pneumonia or pulmonary edema. No pneumothorax.

Skeletal structures are grossly intact.
IMPRESSION: 1. No convincing pneumonia or pulmonary edema.
2. Lung base opacities on the right have mildly increased when
compared to the prior chest radiographs, most consistent with
atelectasis. Small pleural effusions stable compared to the prior
chest CTA.

## 2021-07-08 DIAGNOSIS — J9622 Acute and chronic respiratory failure with hypercapnia: Secondary | ICD-10-CM | POA: Diagnosis not present

## 2021-07-08 DIAGNOSIS — I10 Essential (primary) hypertension: Secondary | ICD-10-CM | POA: Diagnosis not present

## 2021-07-08 DIAGNOSIS — I5032 Chronic diastolic (congestive) heart failure: Secondary | ICD-10-CM | POA: Diagnosis not present

## 2021-07-08 DIAGNOSIS — E119 Type 2 diabetes mellitus without complications: Secondary | ICD-10-CM | POA: Diagnosis not present

## 2021-07-08 DIAGNOSIS — K219 Gastro-esophageal reflux disease without esophagitis: Secondary | ICD-10-CM | POA: Diagnosis not present

## 2021-07-08 DIAGNOSIS — R262 Difficulty in walking, not elsewhere classified: Secondary | ICD-10-CM | POA: Diagnosis not present

## 2021-07-08 DIAGNOSIS — J449 Chronic obstructive pulmonary disease, unspecified: Secondary | ICD-10-CM | POA: Diagnosis not present

## 2021-07-08 DIAGNOSIS — G40909 Epilepsy, unspecified, not intractable, without status epilepticus: Secondary | ICD-10-CM | POA: Diagnosis not present

## 2021-07-08 IMAGING — DX DG CHEST 1V PORT
1 series · 1 of 1 positions shown · non-contrast
Comparison: 12/26/2018

CLINICAL DATA: Shortness of breath.

EXAM:
PORTABLE CHEST 1 VIEW

[chest]
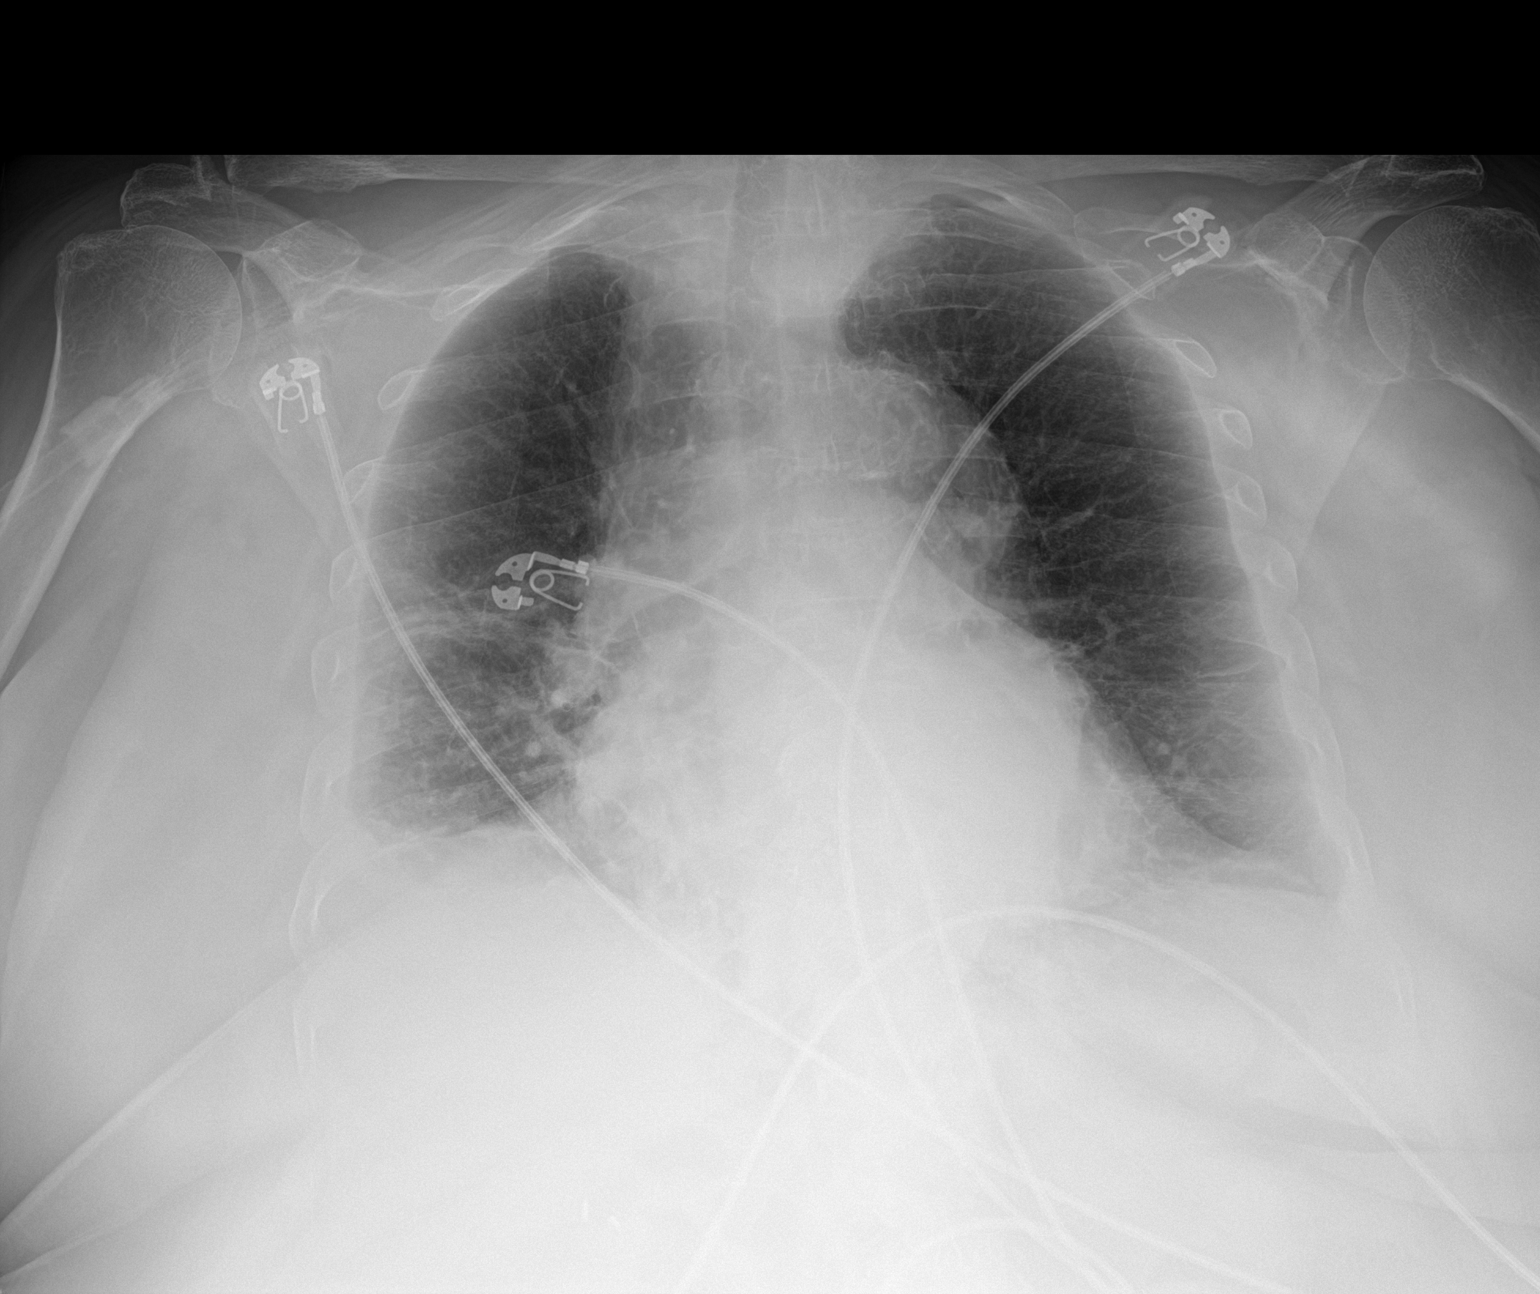

[1 of 1 positions shown; findings below may reference images not displayed]

FINDINGS: Lordotic technique is demonstrated as the lungs are adequately
inflated with subtle linear density right midlung likely atelectasis
and unchanged. Mild opacification over the right base unchanged
likely small amount of pleural fluid/atelectasis. Minimal linear
atelectasis left base. Mild stable cardiomegaly. Remainder of the
exam is unchanged.
IMPRESSION: Stable minimal bibasilar opacification likely bibasilar atelectasis
with small amount right pleural fluid.

Mild stable cardiomegaly.

## 2021-07-12 DIAGNOSIS — J449 Chronic obstructive pulmonary disease, unspecified: Secondary | ICD-10-CM | POA: Diagnosis not present

## 2021-07-12 DIAGNOSIS — I5032 Chronic diastolic (congestive) heart failure: Secondary | ICD-10-CM | POA: Diagnosis not present

## 2021-07-12 DIAGNOSIS — M6281 Muscle weakness (generalized): Secondary | ICD-10-CM | POA: Diagnosis not present

## 2021-07-14 IMAGING — DX DG CHEST 1V PORT
1 series · 1 of 1 positions shown · non-contrast
Comparison: 12/27/2018

CLINICAL DATA: Shortness of breath and fever

EXAM:
PORTABLE CHEST 1 VIEW

[chest ap]
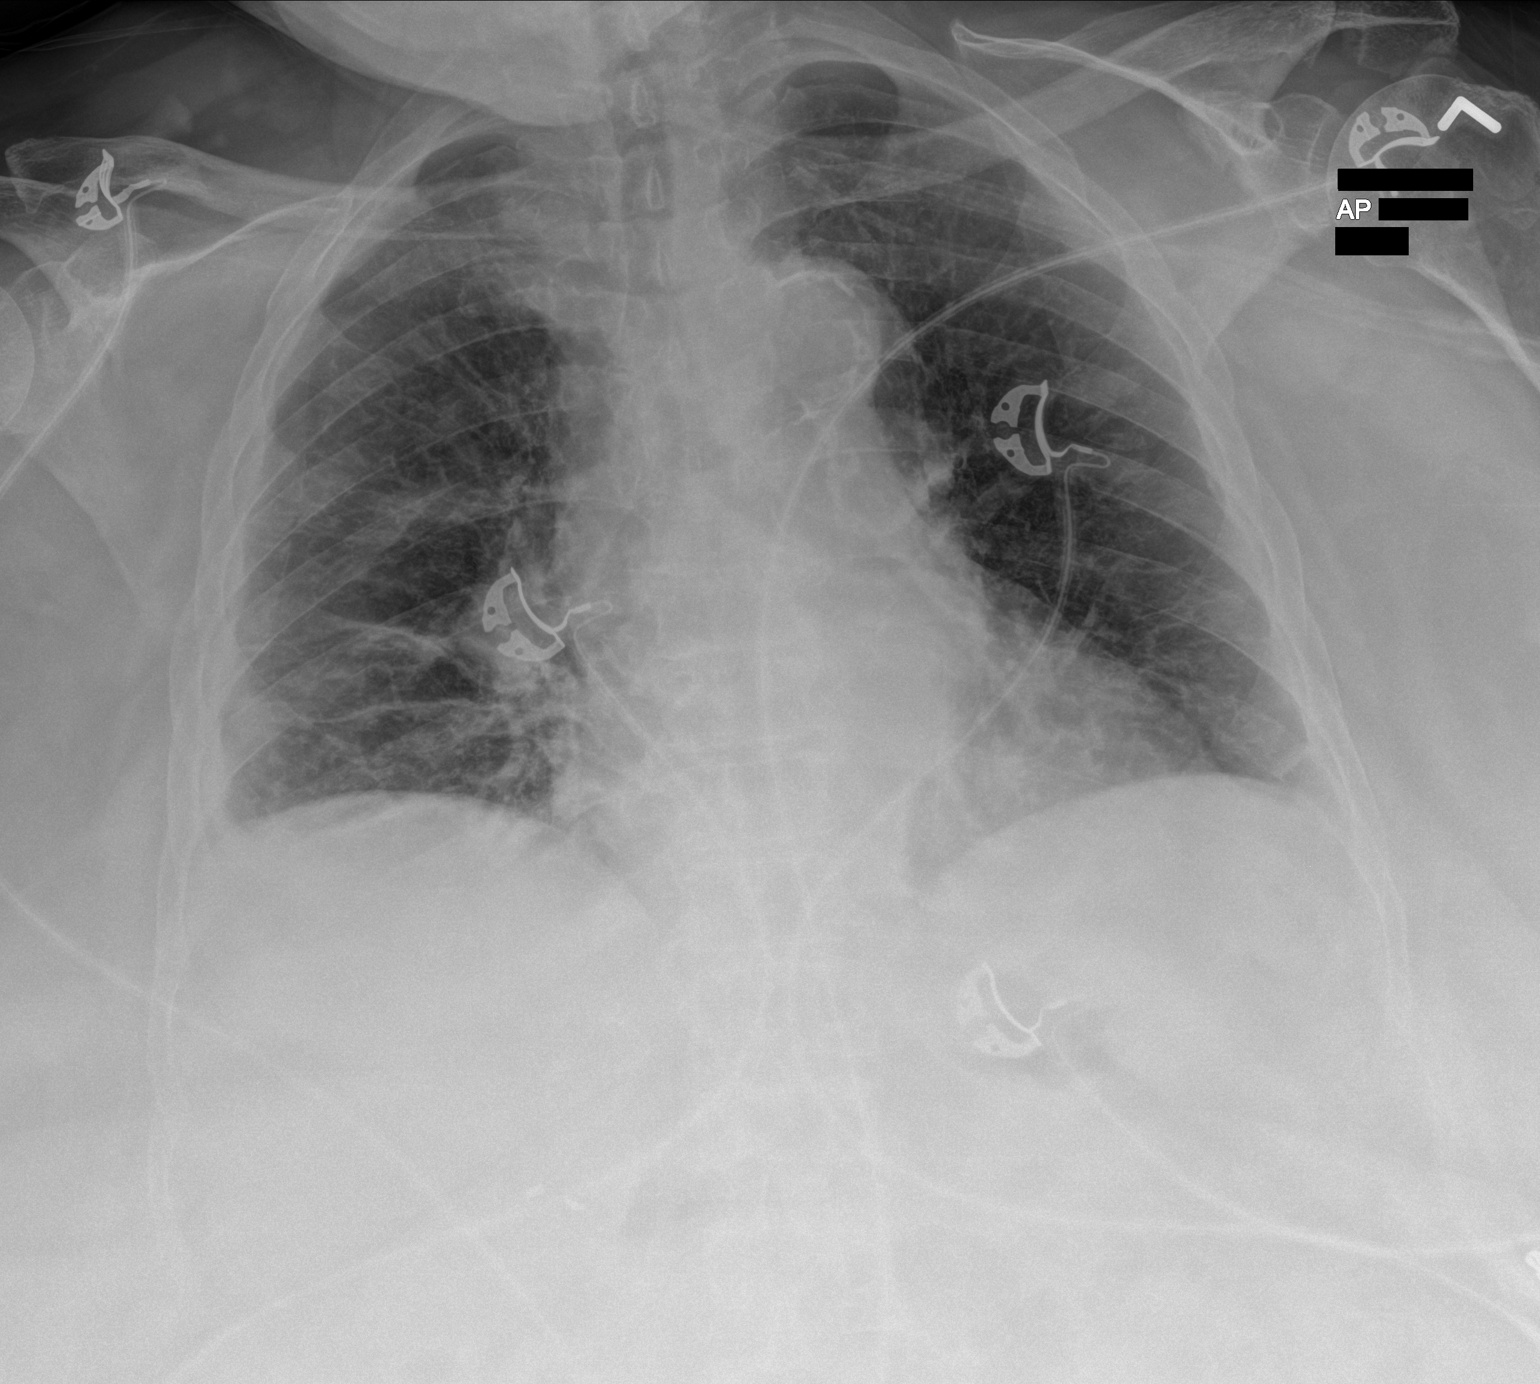

[1 of 1 positions shown; findings below may reference images not displayed]

FINDINGS: Artifact overlies the chest. The left chest is clear except for
minimal atelectasis or scarring at the left base. Small amount of
pleural fluid remains on the right with mild right base atelectasis.
No worsening or new finding.
IMPRESSION: Small amount of pleural fluid on the right with mild right base
atelectasis. No acute finding or significant change since priors.

## 2021-07-17 IMAGING — DX DG CHEST 1V PORT
1 series · 1 of 1 positions shown · non-contrast
Comparison: Three days ago

CLINICAL DATA: Shortness of breath

EXAM:
PORTABLE CHEST 1 VIEW

[chest]
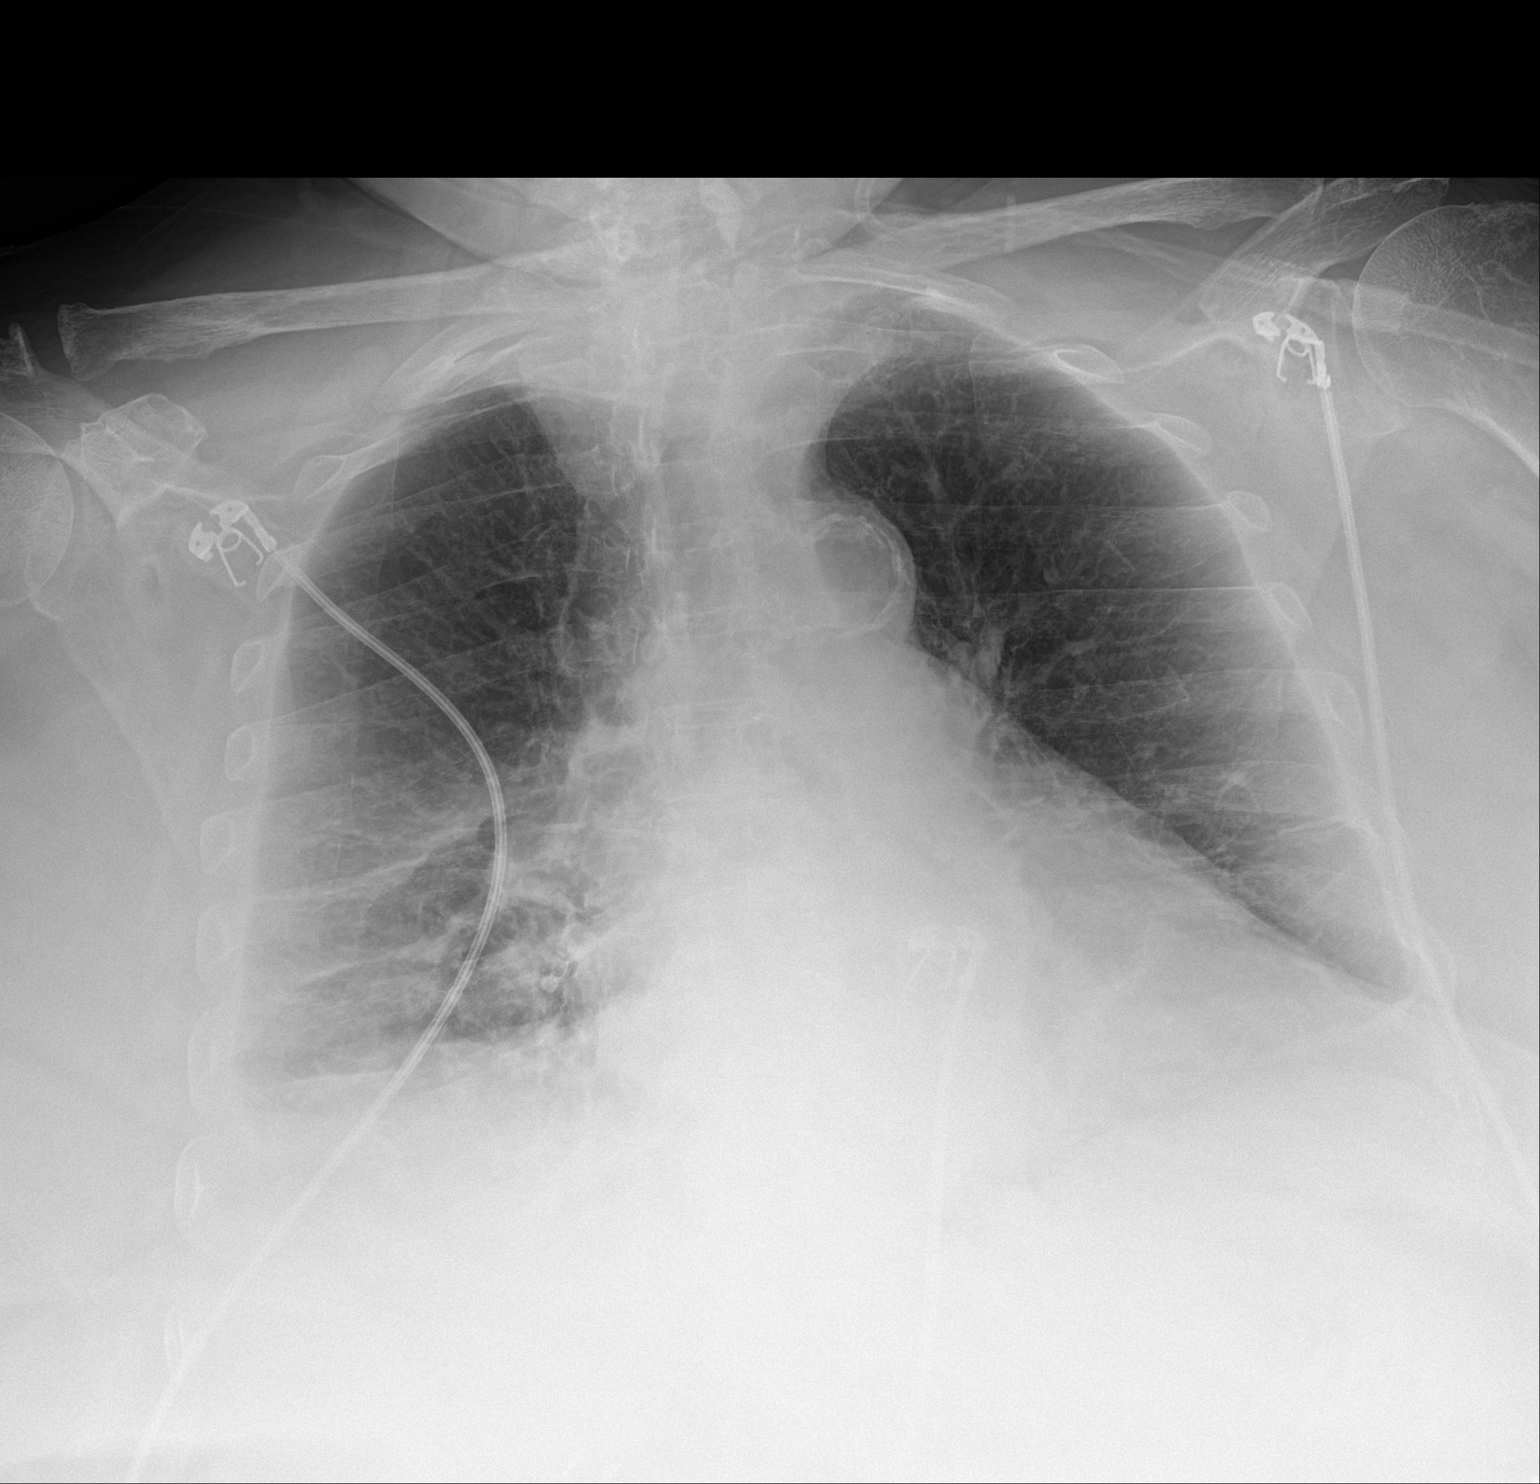

[1 of 1 positions shown; findings below may reference images not displayed]

FINDINGS: Cardiomegaly probable vascular congestion. There is streaky density
at the lung bases with possible trace right pleural effusion. No
pneumothorax.
IMPRESSION: Continued linear opacities suggesting atelectasis or scarring. Trace
right pleural effusion.

## 2021-07-18 DIAGNOSIS — G4733 Obstructive sleep apnea (adult) (pediatric): Secondary | ICD-10-CM | POA: Diagnosis not present

## 2021-07-18 DIAGNOSIS — M6281 Muscle weakness (generalized): Secondary | ICD-10-CM | POA: Diagnosis not present

## 2021-07-18 DIAGNOSIS — E119 Type 2 diabetes mellitus without complications: Secondary | ICD-10-CM | POA: Diagnosis not present

## 2021-07-18 DIAGNOSIS — J449 Chronic obstructive pulmonary disease, unspecified: Secondary | ICD-10-CM | POA: Diagnosis not present

## 2021-07-26 DIAGNOSIS — G4733 Obstructive sleep apnea (adult) (pediatric): Secondary | ICD-10-CM | POA: Diagnosis not present

## 2021-07-29 DIAGNOSIS — F329 Major depressive disorder, single episode, unspecified: Secondary | ICD-10-CM | POA: Diagnosis not present

## 2021-07-29 DIAGNOSIS — I10 Essential (primary) hypertension: Secondary | ICD-10-CM | POA: Diagnosis not present

## 2021-07-29 DIAGNOSIS — J449 Chronic obstructive pulmonary disease, unspecified: Secondary | ICD-10-CM | POA: Diagnosis not present

## 2021-08-02 ENCOUNTER — Ambulatory Visit (INDEPENDENT_AMBULATORY_CARE_PROVIDER_SITE_OTHER): Payer: Medicare HMO

## 2021-08-02 ENCOUNTER — Encounter (INDEPENDENT_AMBULATORY_CARE_PROVIDER_SITE_OTHER): Payer: Self-pay | Admitting: Vascular Surgery

## 2021-08-02 ENCOUNTER — Ambulatory Visit (INDEPENDENT_AMBULATORY_CARE_PROVIDER_SITE_OTHER): Payer: Medicare HMO | Admitting: Vascular Surgery

## 2021-08-02 VITALS — BP 145/79 | HR 61 | Resp 19

## 2021-08-02 DIAGNOSIS — E1122 Type 2 diabetes mellitus with diabetic chronic kidney disease: Secondary | ICD-10-CM

## 2021-08-02 DIAGNOSIS — I7143 Infrarenal abdominal aortic aneurysm, without rupture: Secondary | ICD-10-CM | POA: Diagnosis not present

## 2021-08-02 DIAGNOSIS — N184 Chronic kidney disease, stage 4 (severe): Secondary | ICD-10-CM | POA: Diagnosis not present

## 2021-08-02 DIAGNOSIS — E1159 Type 2 diabetes mellitus with other circulatory complications: Secondary | ICD-10-CM | POA: Diagnosis not present

## 2021-08-02 DIAGNOSIS — J431 Panlobular emphysema: Secondary | ICD-10-CM | POA: Diagnosis not present

## 2021-08-02 DIAGNOSIS — I152 Hypertension secondary to endocrine disorders: Secondary | ICD-10-CM

## 2021-08-02 NOTE — Assessment & Plan Note (Signed)
On oxygen 

## 2021-08-02 NOTE — Progress Notes (Signed)
MRN : [id number]  Katherine [name] is a 81 y.o. (1940-10-21) female who presents with chief complaint of No chief complaint on file. Katherine [name]  History of Present Illness: Patient returns today in follow up of her abdominal aortic aneurysm.  She cannot provide any history and this is obtained from her spouse.  She is minimally mobile at this point.  She is having more leg swelling that sounds like her legs are dependent essentially all the time and she is not ambulatory.  No aneurysm related symptoms reported by the husband.  Her duplex today shows a stable 4.4 cm infrarenal abdominal aortic aneurysm without significant growth from her previous CT scan 6 months ago.  Current Outpatient Medications  Medication Sig Dispense Refill   acetaminophen (TYLENOL) 650 MG CR tablet Take 650 mg by mouth every 8 (eight) hours as needed for pain.     Fluticasone-Umeclidin-Vilant 100-62.5-25 MCG/INH AEPB Inhale 1 puff into the lungs in the morning.     gabapentin (NEURONTIN) 100 MG capsule Take 2 capsules (200 mg total) by mouth at bedtime.     ipratropium-albuterol (DUONEB) 0.5-2.5 (3) MG/3ML SOLN Take 3 mLs by nebulization every 6 (six) hours as needed. 250 mL 0   lacosamide 100 MG TABS Take 1 tablet (100 mg total) by mouth 2 (two) times daily. 60 tablet    levETIRAcetam (KEPPRA) 750 MG tablet Take 1 tablet (750 mg total) by mouth 2 (two) times daily.     lidocaine (LIDODERM) 5 % Place 1 patch onto the skin See admin instructions. Apply 1 patch to left knee topically at bedtime.     pantoprazole (PROTONIX) 20 MG tablet Take 20 mg by mouth daily.     propranolol (INDERAL) 10 MG tablet Take 10 mg by mouth 2 (two) times daily.     sertraline (ZOLOFT) 50 MG tablet Take 1 tablet (50 mg total) by mouth daily.     Skin Protectants, Misc. (EUCERIN) cream Apply 1 application. topically at bedtime.     sodium chloride (OCEAN) 0.65 % SOLN nasal spray Place 3 sprays into both nostrils See admin instructions. 3 sprays  alternating nostrils three times a day for nasal dryness     No current facility-administered medications for this visit.    Past Medical History:  Diagnosis Date   Acute encephalopathy 02/11/2019   Acute kidney injury superimposed on CKD ([place]) 10/24/2017   Acute on chronic anemia 01/03/2019   Acute on chronic congestive heart failure (HCC)    Acute renal failure superimposed on stage 3b chronic kidney disease ([place]) 01/08/2019   Acute respiratory failure with hypoxia ([place]) 09/16/2014   Acute respiratory failure with hypoxia and hypercapnia ([place]) 04/19/2021   Altered mental status    Angioedema    Anxiety and depression    Aphasia 06/28/2017   Cerebral atrophy ([place]) 06/19/2021   Chest pain 07/15/2018   CHF (congestive heart failure) (HCC)    Chronic constipation    Chronic kidney disease    stage 3   Chronic kidney disease, stage 4 (severe) ([place]) 06/19/2021   COPD (chronic obstructive pulmonary disease) ([place])    COPD exacerbation ([place]) 11/30/2017   COPD with acute exacerbation ([place]) 02/24/2018   COVID-19 12/21/2018   COVID-19 virus infection 12/21/2018   Delirium    Diabetes mellitus type 2, uncomplicated ([place]) 50/0/9381   Diabetes mellitus without complication (HCC)    Elevated rheumatoid factor 09/05/2017   Gallstones    GERD (gastroesophageal reflux disease)    Hematemesis 04/28/2018  Hemoptysis 12/20/2018   Hypertension    Influenza A 04/13/2018   Left knee pain 06/12/2016   Left ventricular hypertrophy    Localized edema 05/10/2018   Osteoarthritis    Pain in finger of left hand 09/12/2016   Pain in soft tissues of limb 05/20/2013   Formatting of this note might be different from the original. 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 10/04/2017   Tobacco abuse    Urinary tract infection 11/22/2017   UTI (urinary tract infection) 08/21/2018   Vitamin D deficiency disease     Past Surgical History:  Procedure Laterality Date   CHOLECYSTECTOMY     POLYPECTOMY  11/2011   vocal cord   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   Procedure: TOTAL KNEE ARTHROPLASTY;  Surgeon: Katherine Knows, MD;  Location: [facility] ORS;  Service: Orthopedics;  Laterality: Right;     Social History   Tobacco Use   Smoking status: Former    Packs/day: 0.50    Years: 60.00    Total pack years: 30.00    Types: Cigarettes   Smokeless tobacco: Never   Tobacco comments:    quite 52mo ago-03/26/18  Vaping Use   Vaping Use: Never used  Substance Use Topics   Alcohol use: No    Alcohol/week: 0.0 standard drinks of alcohol    Comment: rare   Drug use: No      Family History  Problem Relation Age of Onset   Alcohol abuse Mother    Sickle cell anemia Daughter    Hypertension Son    Cancer Neg Hx    COPD Neg Hx    Diabetes Neg Hx    Heart disease Neg Hx    Stroke Neg Hx      Allergies  Allergen Reactions   Bee Venom Swelling   Enalapril Maleate Swelling    REVIEW OF SYSTEMS (Negative unless checked)   Constitutional: [] Weight loss  [] Fever  [] Chills Cardiac: [] Chest pain   [] Chest pressure   [] Palpitations   [] Shortness of breath when laying flat   [x] Shortness of breath at rest   [x] Shortness of breath with exertion. Vascular:  [] Pain in legs with walking   [] Pain in legs at rest   [] Pain in legs when laying flat   [] Claudication   [] Pain in feet when walking  [] Pain in feet at rest  [] Pain in feet when laying flat   [] History of DVT   [] Phlebitis   [x] Swelling in legs   [] Varicose veins   [] Non-healing ulcers Pulmonary:   [x] Uses home oxygen    [] Productive cough   [] Hemoptysis   [] Wheeze  [x] COPD   [] Asthma Neurologic:  [] Dizziness  [] Blackouts   [x] Seizures   [x] History of stroke   [] History of TIA  [] Aphasia   [] Temporary blindness   [] Dysphagia   [] Weakness or numbness in arms   [] Weakness or numbness in legs Musculoskeletal:  [x] Arthritis   [] Joint swelling   [] Joint pain   [] Low back pain Hematologic:  []   Easy bruising  [] Easy bleeding   [] Hypercoagulable state   [] Anemic  [] Hepatitis Gastrointestinal:  [] Blood in stool   [] Vomiting blood  [x] Gastroesophageal reflux/heartburn   [] Abdominal pain Genitourinary:  [x] Chronic kidney disease   [] Difficult urination  [] Frequent urination  [] Burning with urination   [] Hematuria Skin:  [] Rashes   [] Ulcers   [] Wounds Psychological:  [] History of anxiety   []  History of major depression.    Physical Examination  BP (!) 145/79 (BP Location: Right Arm)   Pulse 61   Resp 19  Gen:  , NAD, debilitated and nonambulatory Head: [place]/AT, No temporalis wasting. Ear/Nose/Throat: Hearing grossly intact, nares w/o erythema or drainage Eyes: Conjunctiva clear. Sclera non-icteric Neck: Supple.  Trachea midline Pulmonary:  Good air movement, no use of accessory muscles  on supplemental oxygen.  Cardiac: Irregular Vascular:  Vessel Right Left  Radial Palpable Palpable                                   Gastrointestinal: soft, non-tender/non-distended. No guarding/reflex.  Aortic impulse not easy to feel with her habitus Musculoskeletal: M/S 5/5 throughout.  No deformity or atrophy.  In a wheelchair.  1+ bilateral lower extremity edema. Neurologic: Sensation grossly intact in extremities.  Symmetrical.  Speech is limited and nonsensical Psychiatric: Judgment and insight are poor.  Does not provide history Dermatologic: No rashes or ulcers noted.  No cellulitis or open wounds.      Labs Recent Results (from the past 2160 hour(s))  Protime-INR     Status: None   Collection Time: 06/18/21   9:53 AM  Result Value Ref Range   Prothrombin Time 11.9 11.4 - 15.2 seconds   INR 0.9 0.8 - 1.2    Comment: (NOTE) INR goal varies based on device and disease states. Performed at [facility], [place] [address], [place], [place] [address]   APTT     Status: None   Collection Time: 06/18/21  9:53 AM  Result Value Ref Range   aPTT 27 24 - 36 seconds    Comment: Performed at [facility] [address], [place], [place] [address]  CBC     Status: Abnormal   Collection Time: 06/18/21  9:53 AM  Result Value Ref Range   WBC 5.0 4.0 - 10.5 K/uL   RBC 4.02 3.87 - 5.11 MIL/uL   Hemoglobin 12.4 12.0 - 15.0 g/dL   HCT 40.1 36.0 - 46.0 %   MCV 99.8 80.0 - 100.0 fL   MCH 30.8 26.0 - 34.0 pg   MCHC 30.9 30.0 - 36.0 g/dL   RDW 12.0 11.5 - 15.5 %   Platelets 142 (L) 150 - 400 K/uL   nRBC 0.0 0.0 - 0.2 %    Comment: Performed at [facility], [place] [address], [place], [place] [address]  Differential     Status: None   Collection Time: 06/18/21  9:53 AM  Result Value Ref Range   Neutrophils Relative % 54 %   Neutro Abs 2.7 1.7 - 7.7 K/uL   Lymphocytes Relative 34 %   Lymphs Abs 1.7 0.7 - 4.0 K/uL   Monocytes Relative 9 %   Monocytes Absolute 0.4 0.1 - 1.0 K/uL   Eosinophils Relative 3 %   Eosinophils Absolute 0.2 0.0 - 0.5 K/uL   Basophils Relative 0 %   Basophils Absolute 0.0 0.0 - 0.1 K/uL   Immature Granulocytes 0 %  Abs Immature Granulocytes 0.02 0.00 - 0.07 K/uL    Comment: Performed at [facility], [place] [address] [place], [place] [address]  Comprehensive metabolic panel     Status: Abnormal   Collection Time: 06/18/21  9:53 AM  Result Value Ref Range   Sodium 142 135 - 145 mmol/L   Potassium 5.2 (H) 3.5 - 5.1 mmol/L   Chloride 102 98 - 111 mmol/L   CO2 31 22 - 32 mmol/L   Glucose, Bld 90 70 - 99 mg/dL    Comment: Glucose reference range applies only to samples taken after fasting for at least 8 hours.   BUN 29 (H) 8 - 23 mg/dL   Creatinine, Ser 2.01  (H) 0.44 - 1.00 mg/dL   Calcium 9.3 8.9 - 10.3 mg/dL   Total Protein 7.1 6.5 - 8.1 g/dL   Albumin 3.3 (L) 3.5 - 5.0 g/dL   AST 15 15 - 41 U/L   ALT 10 0 - 44 U/L   Alkaline Phosphatase 86 38 - 126 U/L   Total Bilirubin 1.0 0.3 - 1.2 mg/dL   GFR, Estimated 25 (L) >60 mL/min    Comment: (NOTE) Calculated using the CKD-EPI Creatinine Equation (2021)    Anion gap 9 5 - 15    Comment: Performed at [facility] [address] [place], [place] [address]  Hemoglobin A1c     Status: None   Collection Time: 06/18/21  9:53 AM  Result Value Ref Range   Hgb A1c MFr Bld 5.6 4.8 - 5.6 %    Comment: (NOTE) Pre diabetes:          5.7%-6.4%  Diabetes:              >6.4%  Glycemic control for   <7.0% adults with diabetes    Mean Plasma Glucose 114.02 mg/dL    Comment: Performed at [facility] [address], [place],  [address]  CBG monitoring, ED     Status: Abnormal   Collection Time: 06/18/21  9:54 AM  Result Value Ref Range   Glucose-Capillary 102 (H) 70 - 99 mg/dL    Comment: Glucose reference range applies only to samples taken after fasting for at least 8 hours.  I-stat chem 8, ED     Status: Abnormal   Collection Time: 06/18/21 11:15 AM  Result Value Ref Range   Sodium 143 135 - 145 mmol/L   Potassium 4.9 3.5 - 5.1 mmol/L   Chloride 101 98 - 111 mmol/L   BUN 33 (H) 8 - 23 mg/dL   Creatinine, Ser 2.00 (H) 0.44 - 1.00 mg/dL   Glucose, Bld 89 70 - 99 mg/dL    Comment: Glucose reference range applies only to samples taken after fasting for at least 8 hours.   Calcium, Ion 1.13 (L) 1.15 - 1.40 mmol/L   TCO2 36 (H) 22 - 32 mmol/L   Hemoglobin 13.6 12.0 - 15.0 g/dL   HCT 40.0 36.0 - 46.0 %  CBG monitoring, ED     Status: Abnormal   Collection Time: 06/18/21  6:16 PM  Result Value Ref Range   Glucose-Capillary 102 (H) 70 - 99 mg/dL    Comment: Glucose reference range applies only to samples taken after fasting for at least 8 hours.  Glucose, capillary      Status: None   Collection Time: 06/19/21 12:20 AM  Result Value Ref Range   Glucose-Capillary 83 70 - 99 mg/dL  Comment: Glucose reference range applies only to samples taken after fasting for at least 8 hours.  TSH     Status: None   Collection Time: 06/19/21  1:13 AM  Result Value Ref Range   TSH 0.552 0.350 - 4.500 uIU/mL    Comment: Performed by a 3rd Generation assay with a functional sensitivity of <=0.01 uIU/mL. Performed at [facility], [place] [address] [place], [place] [address]   Comprehensive metabolic panel     Status: Abnormal   Collection Time: 06/19/21  1:13 AM  Result Value Ref Range   Sodium 145 135 - 145 mmol/L   Potassium 5.6 (H) 3.5 - 5.1 mmol/L   Chloride 104 98 - 111 mmol/L   CO2 34 (H) 22 - 32 mmol/L   Glucose, Bld 84 70 - 99 mg/dL    Comment: Glucose reference range applies only to samples taken after fasting for at least 8 hours.   BUN 28 (H) 8 - 23 mg/dL   Creatinine, Ser 1.83 (H) 0.44 - 1.00 mg/dL   Calcium 9.5 8.9 - 10.3 mg/dL   Total Protein 7.2 6.5 - 8.1 g/dL   Albumin 3.4 (L) 3.5 - 5.0 g/dL   AST 11 (L) 15 - 41 U/L   ALT 8 0 - 44 U/L   Alkaline Phosphatase 82 38 - 126 U/L   Total Bilirubin 0.9 0.3 - 1.2 mg/dL   GFR, Estimated 28 (L) >60 mL/min    Comment: (NOTE) Calculated using the CKD-EPI Creatinine Equation (2021)    Anion gap 7 5 - 15    Comment: Performed at [facility], [place] [address], [place], [place] [address]  CBC     Status: Abnormal   Collection Time: 06/19/21  1:13 AM  Result Value Ref Range   WBC 4.5 4.0 - 10.5 K/uL   RBC 4.08 3.87 - 5.11 MIL/uL   Hemoglobin 12.4 12.0 - 15.0 g/dL   HCT 41.0 36.0 - 46.0 %   MCV 100.5 (H) 80.0 - 100.0 fL   MCH 30.4 26.0 - 34.0 pg   MCHC 30.2 30.0 - 36.0 g/dL   RDW 11.9 11.5 - 15.5 %   Platelets 141 (L) 150 - 400 K/uL   nRBC 0.0 0.0 - 0.2 %    Comment: Performed at [facility], [place] [address], [place], [place] [address]  Glucose, capillary     Status: None   Collection  Time: 06/19/21  8:44 AM  Result Value Ref Range   Glucose-Capillary 87 70 - 99 mg/dL    Comment: Glucose reference range applies only to samples taken after fasting for at least 8 hours.  Potassium     Status: None   Collection Time: 06/19/21 10:48 AM  Result Value Ref Range   Potassium 4.6 3.5 - 5.1 mmol/L    Comment: DELTA CHECK NOTED Performed at [facility], [place] [address] [place], [place] [address]   Glucose, capillary     Status: None   Collection Time: 06/19/21 12:36 PM  Result Value Ref Range   Glucose-Capillary 95 70 - 99 mg/dL    Comment: Glucose reference range applies only to samples taken after fasting for at least 8 hours.  Glucose, capillary     Status: Abnormal   Collection Time: 06/19/21  3:22 PM  Result Value Ref Range   Glucose-Capillary 100 (H) 70 - 99 mg/dL    Comment: Glucose reference range applies only to samples taken after fasting for  at least 8 hours.  Glucose, capillary     Status: None   Collection Time: 06/19/21  9:04 PM  Result Value Ref Range   Glucose-Capillary 77 70 - 99 mg/dL    Comment: Glucose reference range applies only to samples taken after fasting for at least 8 hours.  CBC     Status: Abnormal   Collection Time: 06/20/21 12:57 AM  Result Value Ref Range   WBC 5.3 4.0 - 10.5 K/uL   RBC 3.81 (L) 3.87 - 5.11 MIL/uL   Hemoglobin 11.6 (L) 12.0 - 15.0 g/dL   HCT 37.9 36.0 - 46.0 %   MCV 99.5 80.0 - 100.0 fL   MCH 30.4 26.0 - 34.0 pg   MCHC 30.6 30.0 - 36.0 g/dL   RDW 12.1 11.5 - 15.5 %   Platelets 135 (L) 150 - 400 K/uL   nRBC 0.0 0.0 - 0.2 %    Comment: Performed at [facility], [place] [address] [place], [place] [address]  Basic metabolic panel     Status: Abnormal   Collection Time: 06/20/21 12:57 AM  Result Value Ref Range   Sodium 145 135 - 145 mmol/L   Potassium 4.5 3.5 - 5.1 mmol/L   Chloride 104 98 - 111 mmol/L   CO2 34 (H) 22 - 32 mmol/L   Glucose, Bld 78 70 - 99 mg/dL    Comment: Glucose reference range  applies only to samples taken after fasting for at least 8 hours.   BUN 23 8 - 23 mg/dL   Creatinine, Ser 1.65 (H) 0.44 - 1.00 mg/dL   Calcium 9.2 8.9 - 10.3 mg/dL   GFR, Estimated 31 (L) >60 mL/min    Comment: (NOTE) Calculated using the CKD-EPI Creatinine Equation (2021)    Anion gap 7 5 - 15    Comment: Performed at [facility] [address] [place], [place] [address]  Glucose, capillary     Status: None   Collection Time: 06/20/21  7:52 AM  Result Value Ref Range   Glucose-Capillary 79 70 - 99 mg/dL    Comment: Glucose reference range applies only to samples taken after fasting for at least 8 hours.  Urinalysis, Routine w reflex microscopic     Status: Abnormal   Collection Time: 06/20/21  8:38 AM  Result Value Ref Range   Color, Urine YELLOW YELLOW   APPearance CLOUDY (A) CLEAR   Specific Gravity, Urine 1.010 1.005 - 1.030   pH 5.0 5.0 - 8.0   Glucose, UA NEGATIVE NEGATIVE mg/dL   Hgb urine dipstick MODERATE (A) NEGATIVE   Bilirubin Urine NEGATIVE NEGATIVE   Ketones, ur 5 (A) NEGATIVE mg/dL   Protein, ur NEGATIVE NEGATIVE mg/dL   Nitrite POSITIVE (A) NEGATIVE   Leukocytes,Ua LARGE (A) NEGATIVE   RBC / HPF 11-20 0 - 5 RBC/hpf   WBC, UA >50 (H) 0 - 5 WBC/hpf   Bacteria, UA MANY (A) NONE SEEN   Squamous Epithelial / LPF 0-5 0 - 5   WBC Clumps PRESENT     Comment: Performed at [facility], [place] [address], [place], [place] [address]  Glucose, capillary     Status: None   Collection Time: 06/20/21 12:14 PM  Result Value Ref Range   Glucose-Capillary 97 70 - 99 mg/dL    Comment: Glucose reference range applies only to samples taken after fasting for at least 8 hours.  Glucose, capillary     Status: None   Collection  Time: 06/20/21  4:12 PM  Result Value Ref Range   Glucose-Capillary 99 70 - 99 mg/dL    Comment: Glucose reference range applies only to samples taken after fasting for at least 8 hours.  CBC     Status: Abnormal   Collection Time: 06/21/21   3:58 AM  Result Value Ref Range   WBC 4.7 4.0 - 10.5 K/uL   RBC 3.77 (L) 3.87 - 5.11 MIL/uL   Hemoglobin 11.5 (L) 12.0 - 15.0 g/dL   HCT 36.2 36.0 - 46.0 %   MCV 96.0 80.0 - 100.0 fL   MCH 30.5 26.0 - 34.0 pg   MCHC 31.8 30.0 - 36.0 g/dL   RDW 12.0 11.5 - 15.5 %   Platelets 130 (L) 150 - 400 K/uL    Comment: REPEATED TO VERIFY   nRBC 0.0 0.0 - 0.2 %    Comment: Performed at [facility], [place] [address] [place], [place] [address]  Basic metabolic panel     Status: Abnormal   Collection Time: 06/21/21  3:58 AM  Result Value Ref Range   Sodium 140 135 - 145 mmol/L   Potassium 4.0 3.5 - 5.1 mmol/L   Chloride 101 98 - 111 mmol/L   CO2 30 22 - 32 mmol/L   Glucose, Bld 84 70 - 99 mg/dL    Comment: Glucose reference range applies only to samples taken after fasting for at least 8 hours.   BUN 17 8 - 23 mg/dL   Creatinine, Ser 1.50 (H) 0.44 - 1.00 mg/dL   Calcium 9.3 8.9 - 10.3 mg/dL   GFR, Estimated 35 (L) >60 mL/min    Comment: (NOTE) Calculated using the CKD-EPI Creatinine Equation (2021)    Anion gap 9 5 - 15    Comment: Performed at [facility] [address], [place], [place] [address]  Glucose, capillary     Status: None   Collection Time: 06/21/21  7:41 AM  Result Value Ref Range   Glucose-Capillary 91 70 - 99 mg/dL    Comment: Glucose reference range applies only to samples taken after fasting for at least 8 hours.  Glucose, capillary     Status: None   Collection Time: 06/21/21 12:11 PM  Result Value Ref Range   Glucose-Capillary 84 70 - 99 mg/dL    Comment: Glucose reference range applies only to samples taken after fasting for at least 8 hours.  Glucose, capillary     Status: Abnormal   Collection Time: 06/21/21  4:10 PM  Result Value Ref Range   Glucose-Capillary 115 (H) 70 - 99 mg/dL    Comment: Glucose reference range applies only to samples taken after fasting for at least 8 hours.  Glucose, capillary     Status: None   Collection Time: 06/21/21   9:27 PM  Result Value Ref Range   Glucose-Capillary 99 70 - 99 mg/dL    Comment: Glucose reference range applies only to samples taken after fasting for at least 8 hours.   Comment 1 Notify RN   Comprehensive metabolic panel     Status: Abnormal   Collection Time: 06/27/21  5:15 PM  Result Value Ref Range   Sodium 142 135 - 145 mmol/L   Potassium 4.5 3.5 - 5.1 mmol/L   Chloride 102 98 - 111 mmol/L   CO2 31 22 - 32 mmol/L   Glucose, Bld 117 (H) 70 - 99 mg/dL    Comment: Glucose reference range applies only to  samples taken after fasting for at least 8 hours.   BUN 28 (H) 8 - 23 mg/dL   Creatinine, Ser 1.87 (H) 0.44 - 1.00 mg/dL   Calcium 9.1 8.9 - 10.3 mg/dL   Total Protein 7.1 6.5 - 8.1 g/dL   Albumin 3.3 (L) 3.5 - 5.0 g/dL   AST 13 (L) 15 - 41 U/L   ALT 8 0 - 44 U/L   Alkaline Phosphatase 76 38 - 126 U/L   Total Bilirubin 0.5 0.3 - 1.2 mg/dL   GFR, Estimated 27 (L) >60 mL/min    Comment: (NOTE) Calculated using the CKD-EPI Creatinine Equation (2021)    Anion gap 9 5 - 15    Comment: Performed at [name], [place] [place], [place] [address]  CBC     Status: Abnormal   Collection Time: 06/27/21  5:15 PM  Result Value Ref Range   WBC 6.7 4.0 - 10.5 K/uL   RBC 4.23 3.87 - 5.11 MIL/uL   Hemoglobin 12.5 12.0 - 15.0 g/dL   HCT 41.1 36.0 - 46.0 %   MCV 97.2 80.0 - 100.0 fL   MCH 29.6 26.0 - 34.0 pg   MCHC 30.4 30.0 - 36.0 g/dL   RDW 13.0 11.5 - 15.5 %   Platelets 164 150 - 400 K/uL   nRBC 0.6 (H) 0.0 - 0.2 %    Comment: Performed at [facility], [place], [place] [address]  Troponin I (High Sensitivity)     Status: None   Collection Time: 06/27/21  5:15 PM  Result Value Ref Range   Troponin I (High Sensitivity) 7 <18 ng/L    Comment: (NOTE) Elevated high sensitivity troponin I (hsTnI) values and significant  changes across serial measurements may suggest ACS but many other  chronic and acute conditions are known to elevate  hsTnI results.  Refer to the "Links" section for chest pain algorithms and additional  guidance. Performed at [facility], [address] [place], [place] [address]   Brain natriuretic peptide     Status: Abnormal   Collection Time: 06/27/21  5:15 PM  Result Value Ref Range   B Natriuretic Peptide 114.8 (H) 0.0 - 100.0 pg/mL    Comment: Performed at [facility], [place], [place] [address]  Procalcitonin - Baseline     Status: None   Collection Time: 06/27/21  5:15 PM  Result Value Ref Range   Procalcitonin <0.10 ng/mL    Comment:        Interpretation: PCT (Procalcitonin) <= 0.5 ng/mL: Systemic infection (sepsis) is not likely. Local bacterial infection is possible. (NOTE)       Sepsis PCT Algorithm           Lower Respiratory Tract                                      Infection PCT Algorithm    ----------------------------     ----------------------------         PCT < 0.25 ng/mL                PCT < 0.10 ng/mL          Strongly encourage             Strongly discourage   discontinuation of antibiotics    initiation of antibiotics    ----------------------------     -----------------------------  PCT 0.25 - 0.50 ng/mL            PCT 0.10 - 0.25 ng/mL               OR       >80% decrease in PCT            Discourage initiation of                                            antibiotics      Encourage discontinuation           of antibiotics    ----------------------------     -----------------------------         PCT >= 0.50 ng/mL              PCT 0.26 - 0.50 ng/mL               AND        <80% decrease in PCT             Encourage initiation of                                             antibiotics       Encourage continuation           of antibiotics    ----------------------------     -----------------------------        PCT >= 0.50 ng/mL                  PCT > 0.50 ng/mL               AND         increase in PCT                   Strongly encourage                                      initiation of antibiotics    Strongly encourage escalation           of antibiotics                                     -----------------------------                                           PCT <= 0.25 ng/mL                                                 OR                                        > 80% decrease in PCT  Discontinue / Do not initiate                                             antibiotics  Performed at [facility], [address] [place], [place] [address]   CBG monitoring, ED     Status: Abnormal   Collection Time: 06/27/21  6:08 PM  Result Value Ref Range   Glucose-Capillary 119 (H) 70 - 99 mg/dL    Comment: Glucose reference range applies only to samples taken after fasting for at least 8 hours.  Resp Panel by RT-PCR (Flu A&B, Covid) Nasopharyngeal Swab     Status: None   Collection Time: 06/27/21  6:16 PM   Specimen: Nasopharyngeal Swab; Nasopharyngeal(NP) swabs in vial transport medium  Result Value Ref Range   SARS Coronavirus 2 by RT PCR NEGATIVE NEGATIVE    Comment: (NOTE) SARS-CoV-2 target nucleic acids are NOT DETECTED.  The SARS-CoV-2 RNA is generally detectable in upper respiratory specimens during the acute phase of infection. The lowest concentration of SARS-CoV-2 viral copies this assay can detect is 138 copies/mL. A negative result does not preclude SARS-Cov-2 infection and should not be used as the sole basis for treatment or other patient management decisions. A negative result may occur with  improper specimen collection/handling, submission of specimen other than nasopharyngeal swab, presence of viral mutation(s) within the areas targeted by this assay, and inadequate number of viral copies(<138 copies/mL). A negative result must be combined with clinical observations, patient history, and epidemiological information. The expected  result is Negative.  Fact Sheet for Patients:  EntrepreneurPulse.com.au  Fact Sheet for Healthcare Providers:  IncredibleEmployment.be  This test is no t yet approved or cleared by the [place] FDA and  has been authorized for detection and/or diagnosis of SARS-CoV-2 by FDA under an Emergency Use Authorization (EUA). This EUA will remain  in effect (meaning this test can be used) for the duration of the COVID-19 declaration under Section 564(b)(1) of the Act, 21 U.S.C.section 360bbb-3(b)(1), unless the authorization is terminated  or revoked sooner.       Influenza A by PCR NEGATIVE NEGATIVE   Influenza B by PCR NEGATIVE NEGATIVE    Comment: (NOTE) The Xpert Xpress SARS-CoV-2/FLU/RSV plus assay is intended as an aid in the diagnosis of influenza from Nasopharyngeal swab specimens and should not be used as a sole basis for treatment. Nasal washings and aspirates are unacceptable for Xpert Xpress SARS-CoV-2/FLU/RSV testing.  Fact Sheet for Patients: EntrepreneurPulse.com.au  Fact Sheet for Healthcare Providers: IncredibleEmployment.be  This test is not yet approved or cleared by the [place] FDA and has been authorized for detection and/or diagnosis of SARS-CoV-2 by FDA under an Emergency Use Authorization (EUA). This EUA will remain in effect (meaning this test can be used) for the duration of the COVID-19 declaration under Section 564(b)(1) of the Act, 21 U.S.C. section 360bbb-3(b)(1), unless the authorization is terminated or revoked.  Performed at [name], [address] [place], [place] [address]     Radiology No results found.  Assessment/Plan Stroke [facility]) Seems fairly debilitated from this   Essential hypertension blood pressure control important in reducing the progression of atherosclerotic disease and aneurysmal growth. On appropriate oral medications.      Type 2 diabetes mellitus with stage 4 chronic kidney disease (HCC) blood glucose control important in reducing the progression of atherosclerotic  disease. Also, involved in wound healing. On appropriate medications.  COPD (chronic obstructive pulmonary disease) (HCC) On oxygen  AAA (abdominal aortic aneurysm) (HCC) Her duplex today shows a stable 4.4 cm infrarenal abdominal aortic aneurysm without significant growth from her previous CT scan 6 months ago.  No role for intervention at this size.  She is a very poor candidate for repair given her debilitated state, but if this has rapid growth or grows to well over 5 cm we may consider repair.  Continue to follow on 35-month intervals.    Leotis Pain, MD  08/02/2021 11:55 AM    This note was created with Dragon medical transcription system.  Any errors from dictation are purely unintentional

## 2021-08-02 NOTE — Assessment & Plan Note (Signed)
Her duplex today shows a stable 4.4 cm infrarenal abdominal aortic aneurysm without significant growth from her previous CT scan 6 months ago.  No role for intervention at this size.  She is a very poor candidate for repair given her debilitated state, but if this has rapid growth or grows to well over 5 cm we may consider repair.  Continue to follow on 40-month intervals.

## 2021-08-08 DIAGNOSIS — M79652 Pain in left thigh: Secondary | ICD-10-CM | POA: Diagnosis not present

## 2021-08-09 ENCOUNTER — Encounter: Payer: Self-pay | Admitting: Neurology

## 2021-08-09 ENCOUNTER — Ambulatory Visit (INDEPENDENT_AMBULATORY_CARE_PROVIDER_SITE_OTHER): Payer: Medicare HMO | Admitting: Neurology

## 2021-08-09 VITALS — BP 142/82 | HR 94

## 2021-08-09 DIAGNOSIS — G40909 Epilepsy, unspecified, not intractable, without status epilepticus: Secondary | ICD-10-CM | POA: Diagnosis not present

## 2021-08-09 DIAGNOSIS — R4701 Aphasia: Secondary | ICD-10-CM

## 2021-08-09 DIAGNOSIS — Z79899 Other long term (current) drug therapy: Secondary | ICD-10-CM | POA: Diagnosis not present

## 2021-08-09 DIAGNOSIS — R413 Other amnesia: Secondary | ICD-10-CM

## 2021-08-09 DIAGNOSIS — F09 Unspecified mental disorder due to known physiological condition: Secondary | ICD-10-CM

## 2021-08-11 ENCOUNTER — Telehealth: Payer: Self-pay | Admitting: Neurology

## 2021-08-11 DIAGNOSIS — R569 Unspecified convulsions: Secondary | ICD-10-CM | POA: Diagnosis not present

## 2021-08-11 NOTE — Telephone Encounter (Signed)
I spoke with Abigail Butts from [facility]. She was driving during our call, so she was unable to look at the patient's chart. She stated the patient's Keppra level was approximately 54. She would like to know if it is okay to adjust her medication or if you would advise for it to remain the same for now.

## 2021-08-11 NOTE — Telephone Encounter (Signed)
I provided a call back to Abigail Butts from [facility]. Informed her there is no need to adjust medication.

## 2021-08-11 NOTE — Telephone Encounter (Signed)
No need to adjust medication.  Thanks

## 2021-08-11 NOTE — Telephone Encounter (Signed)
[facility]) Would like a call from the nurse in regards to pt's Keppra levels.

## 2021-08-18 DIAGNOSIS — M79641 Pain in right hand: Secondary | ICD-10-CM | POA: Diagnosis not present

## 2021-08-23 DIAGNOSIS — I5032 Chronic diastolic (congestive) heart failure: Secondary | ICD-10-CM | POA: Diagnosis not present

## 2021-08-23 DIAGNOSIS — J3489 Other specified disorders of nose and nasal sinuses: Secondary | ICD-10-CM | POA: Diagnosis not present

## 2021-08-23 DIAGNOSIS — M6281 Muscle weakness (generalized): Secondary | ICD-10-CM | POA: Diagnosis not present

## 2021-08-23 DIAGNOSIS — J449 Chronic obstructive pulmonary disease, unspecified: Secondary | ICD-10-CM | POA: Diagnosis not present

## 2021-08-23 IMAGING — CR DG CHEST 1V PORT
1 series · 1 of 1 positions shown · non-contrast
Comparison: January 05, 2019

CLINICAL DATA: Altered mental status and hypertension.

EXAM:
PORTABLE CHEST 1 VIEW

[dg chest port 1 view]
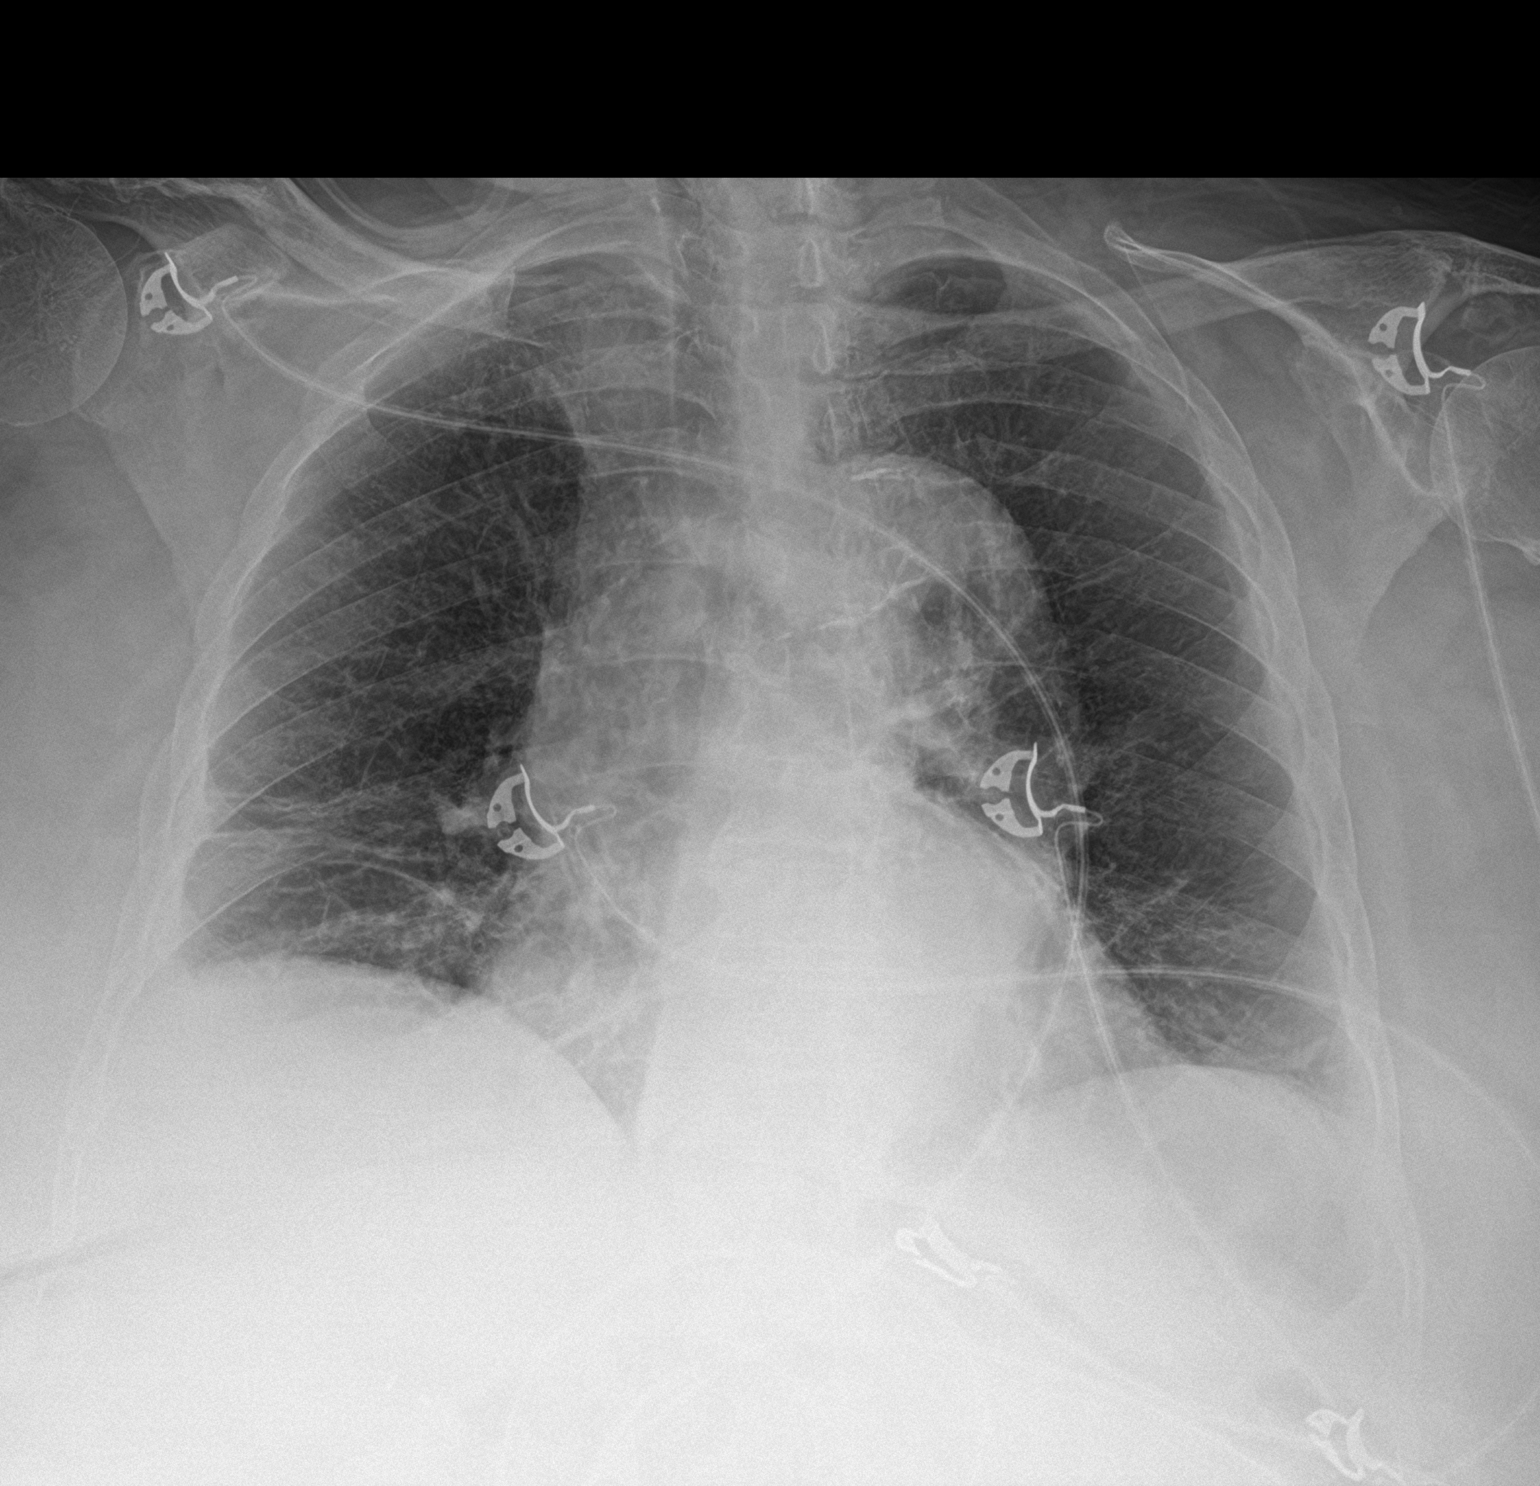

[1 of 1 positions shown; findings below may reference images not displayed]

FINDINGS: There is atelectatic change in the lung bases. There is no edema or
consolidation. Heart is borderline enlarged with pulmonary
vascularity normal. Aorta is prominent with aortic atherosclerosis.
No adenopathy. Bones are osteoporotic. There is degenerative change
in each shoulder.
IMPRESSION: Bibasilar atelectasis. No edema or consolidation. Stable cardiac
silhouette. Aortic prominence may be indicative of chronic
hypertension. Aortic Atherosclerosis ([id number]). Bones
osteoporotic.

## 2021-08-23 IMAGING — CT CT HEAD W/O CM
3 series · 15 of 45 positions shown, 18 images · non-contrast
Comparison: December 24, 2018

CLINICAL DATA: Altered mental status/unresponsive.

EXAM:
CT HEAD WITHOUT CONTRAST
TECHNIQUE: Contiguous axial images were obtained from the base of the skull
through the vertex without intravenous contrast.

[Series 2: head wo · axial · 0.39mm/px · z∈[-77,+38]mm · 9 of 28 slices shown, 12 images]
[im 3/28  brain]
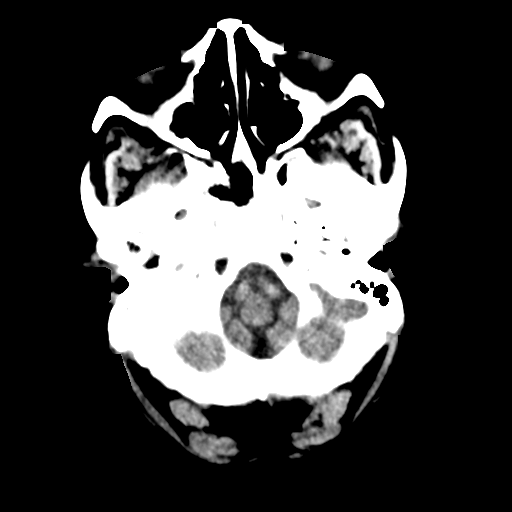
[im 3/28  bone]
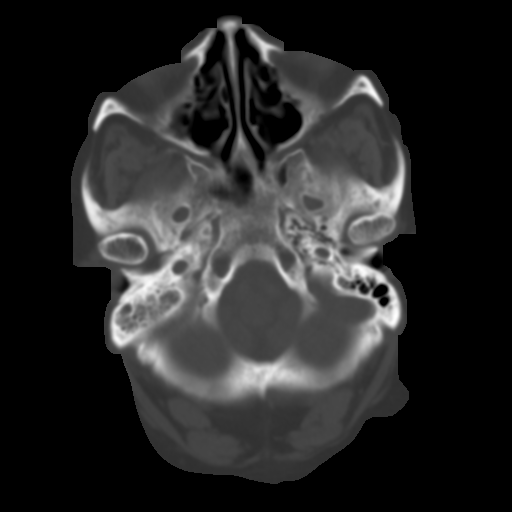
[im 6/28  brain]
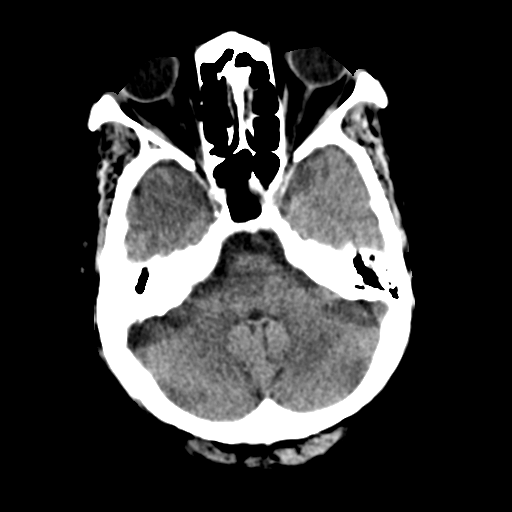
[im 9/28  brain]
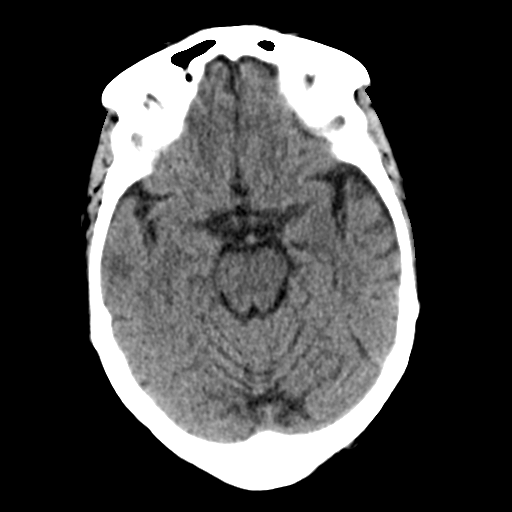
[im 12/28  brain]
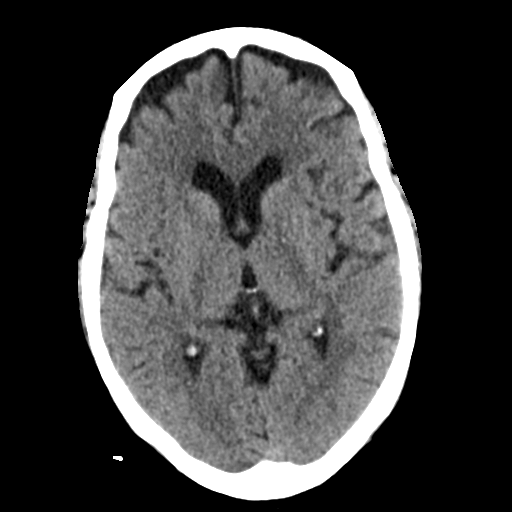
[im 15/28  brain]
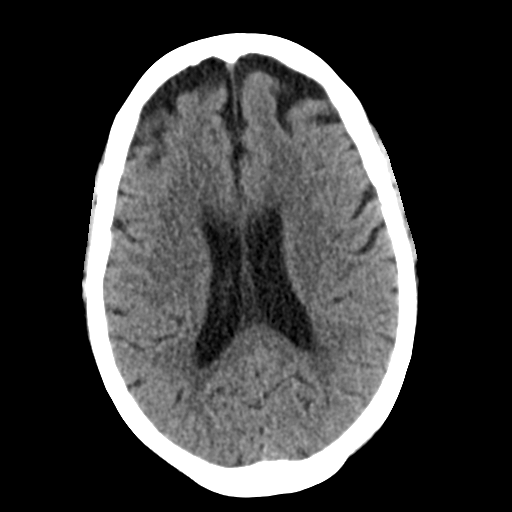
[im 15/28  bone]
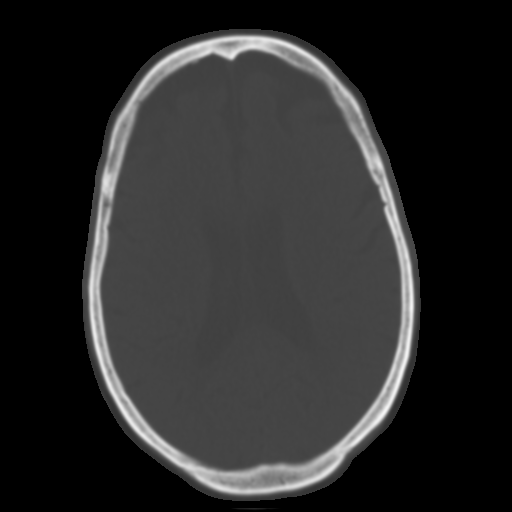
[im 17/28  brain]
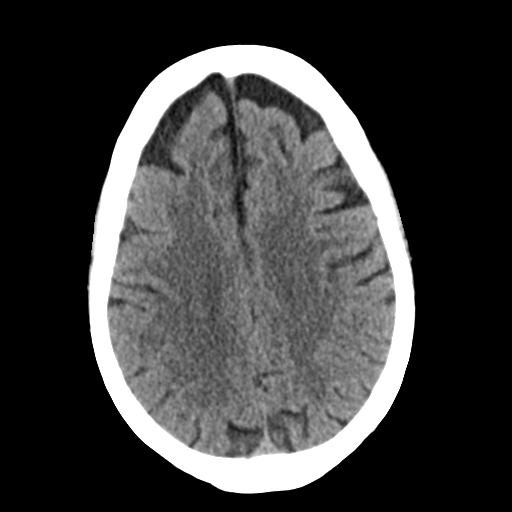
[im 20/28  brain]
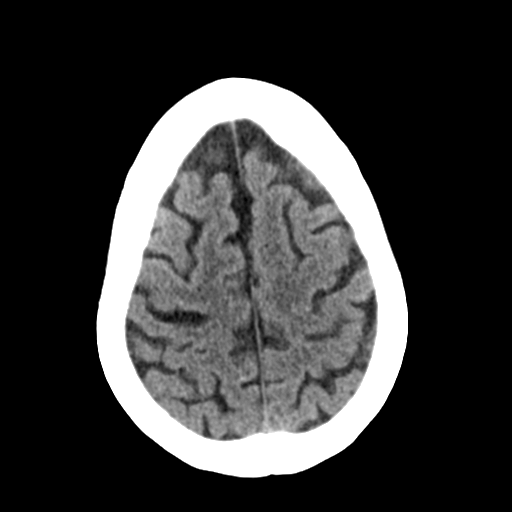
[im 23/28  brain]
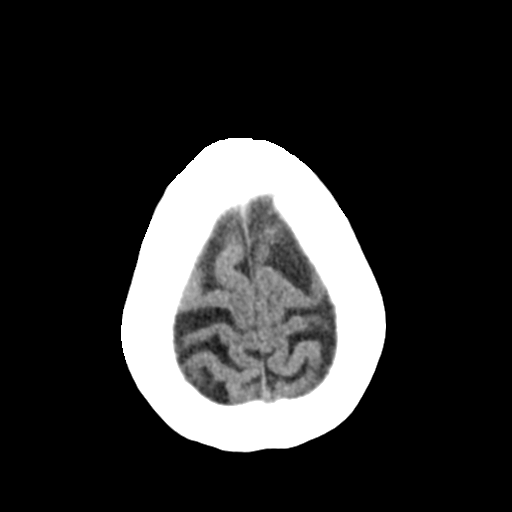
[im 26/28  brain]
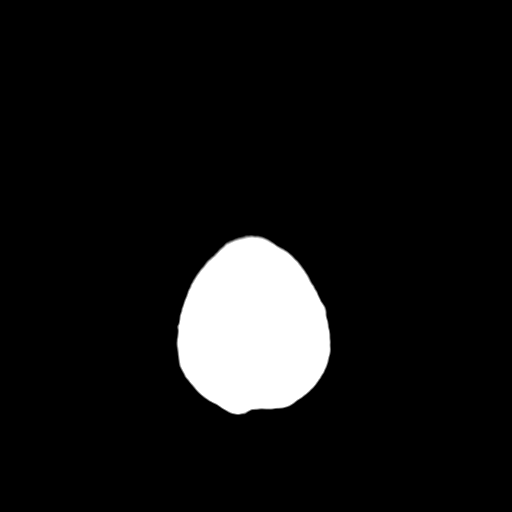
[im 26/28  bone]
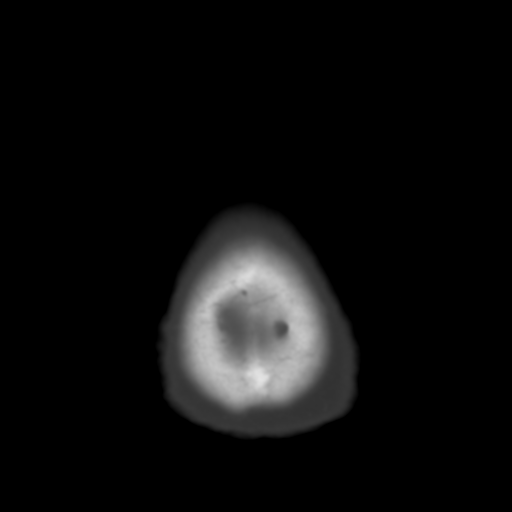

[Series 4: coronal soft tissue · coronal · 0.28mm/px · 3 of 65 slices shown]
[im 22/65  brain]
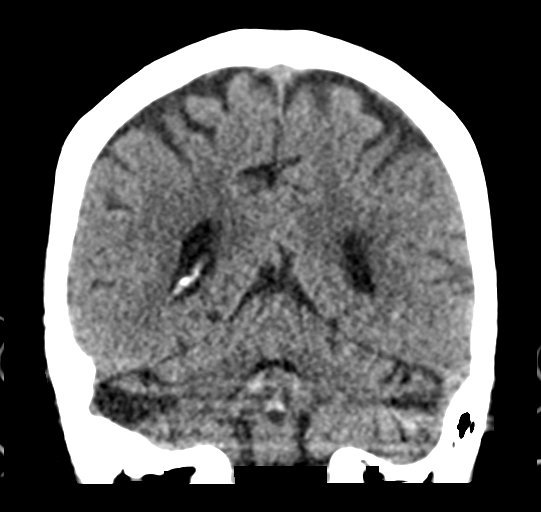
[im 29/65  brain]
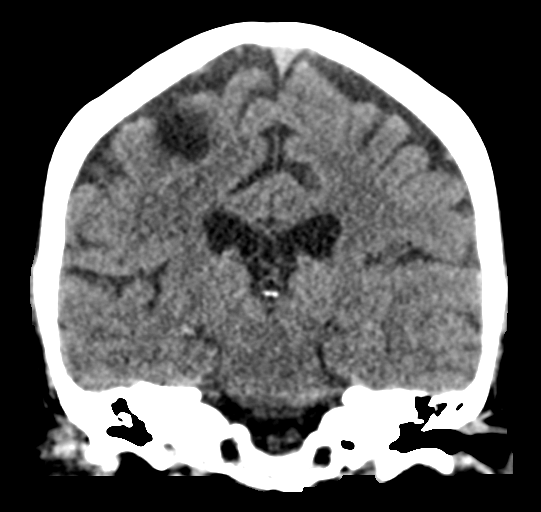
[im 36/65  brain]
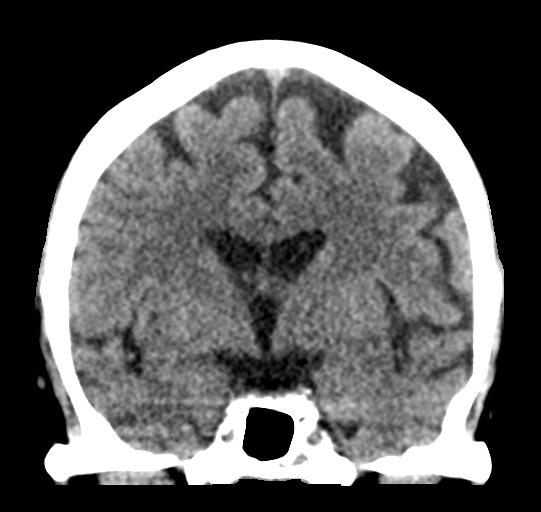

[Series 5: sagittal soft tissue · sagittal · 0.28mm/px · 3 of 54 slices shown]
[im 18/54  brain]
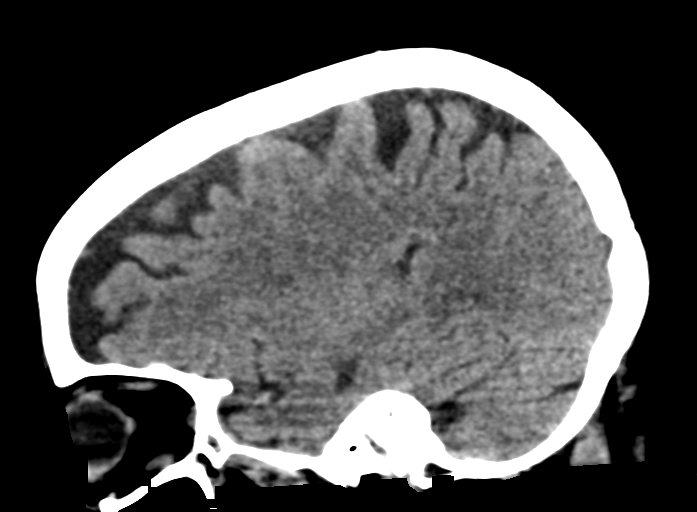
[im 27/54  brain]
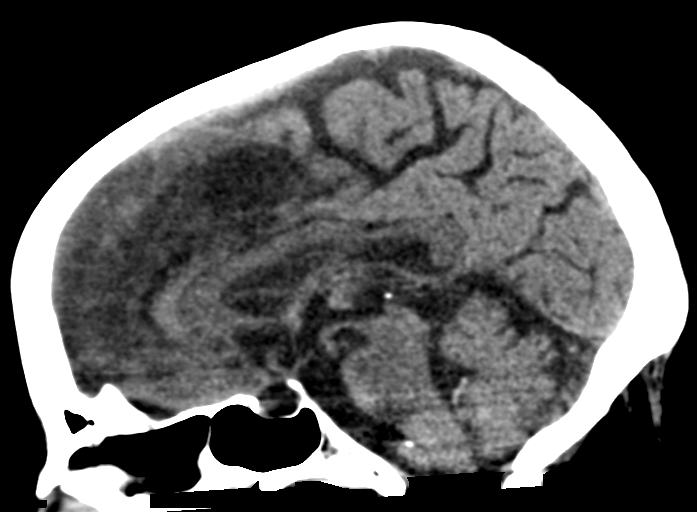
[im 36/54  brain]
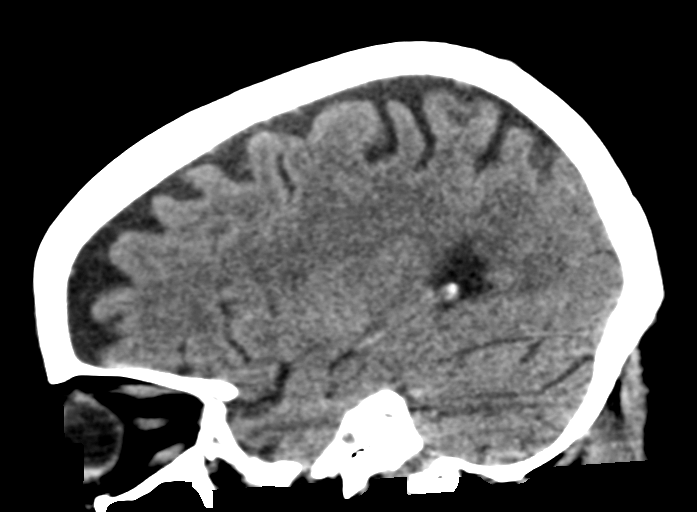

[15 of 45 positions shown; findings below may reference images not displayed]

FINDINGS: Brain: Mild diffuse atrophy is stable. There is no intracranial
mass, hemorrhage, extra-axial fluid collection, or midline shift.
There is mild small vessel disease in the centra semiovale
bilaterally. No acute appearing infarct is evident on this study.

Vascular: No hyperdense vessel. There is calcification in each
carotid siphon region. There is slight calcification in the distal
left vertebral artery.

Skull: The bony calvarium appears intact.

Sinuses/Orbits: There is mucosal thickening in several ethmoid air
cells. Other visualized paranasal sinuses are clear. Orbits appear
symmetric bilaterally.

Other: There is diffuse opacification of mastoid air cells on the
right. Mastoids on the left are clear.
IMPRESSION: Mild atrophy with mild periventricular small vessel disease. No
acute infarct. No mass or hemorrhage.

There are foci of arterial vascular calcification. There is mucosal
thickening in several ethmoid air cells. There is diffuse
opacification of mastoid air cells on the right, a finding present
on previous study as well.

## 2021-08-24 DIAGNOSIS — R220 Localized swelling, mass and lump, head: Secondary | ICD-10-CM | POA: Diagnosis not present

## 2021-08-25 DIAGNOSIS — R220 Localized swelling, mass and lump, head: Secondary | ICD-10-CM | POA: Diagnosis not present

## 2021-08-25 IMAGING — DX DG HAND COMPLETE 3+V*R*
3 series · 4 of 4 positions shown · non-contrast
Comparison: None.

CLINICAL DATA: Right hand pain, no known injury, initial encounter

EXAM:
RIGHT HAND - COMPLETE 3+ VIEW

[hand ap]
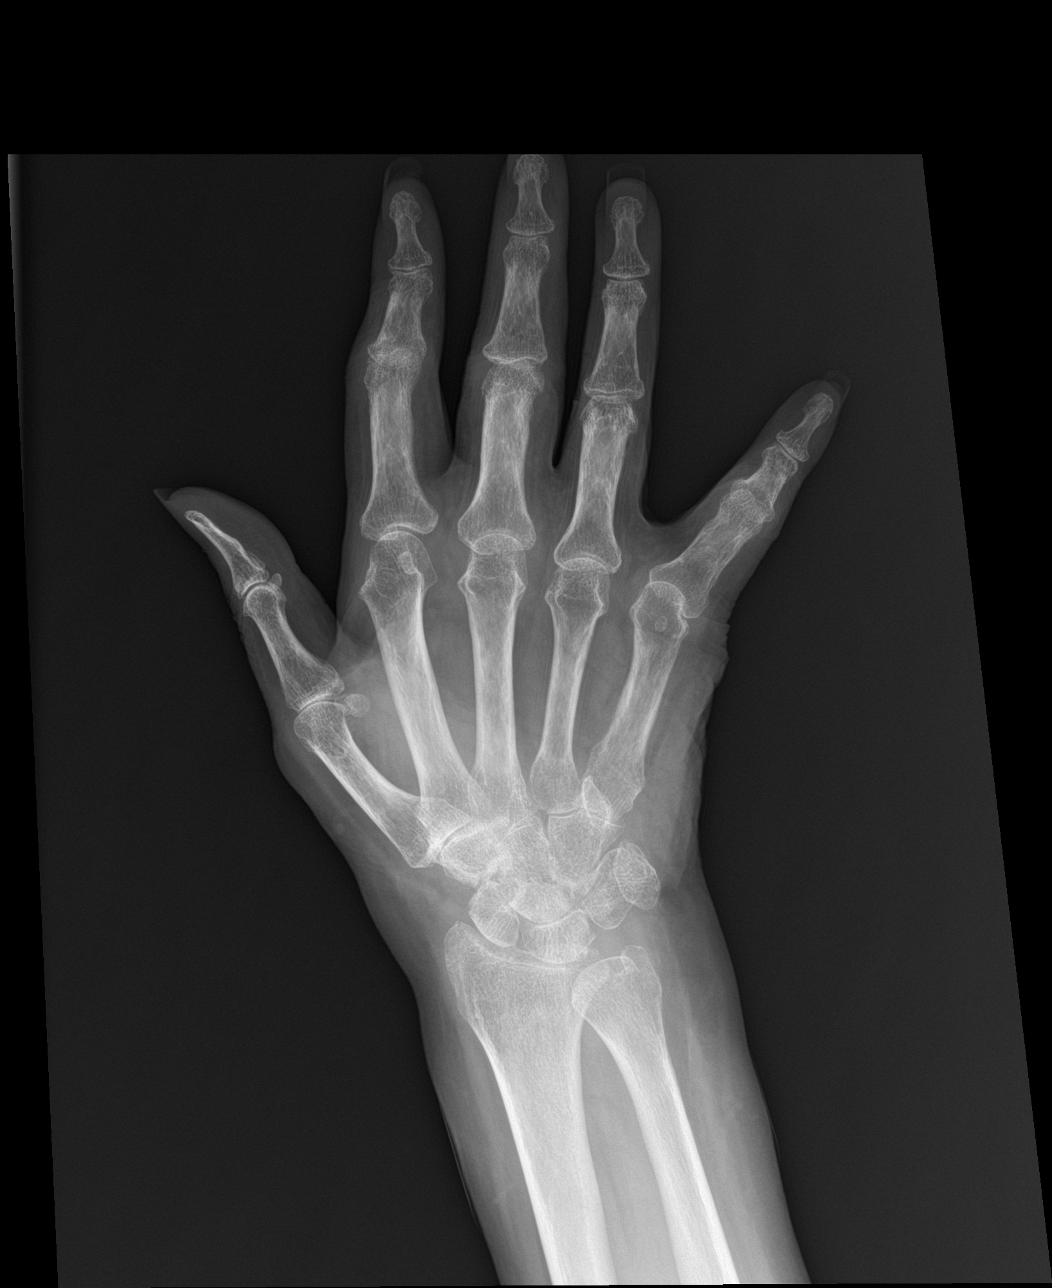

[hand obl]
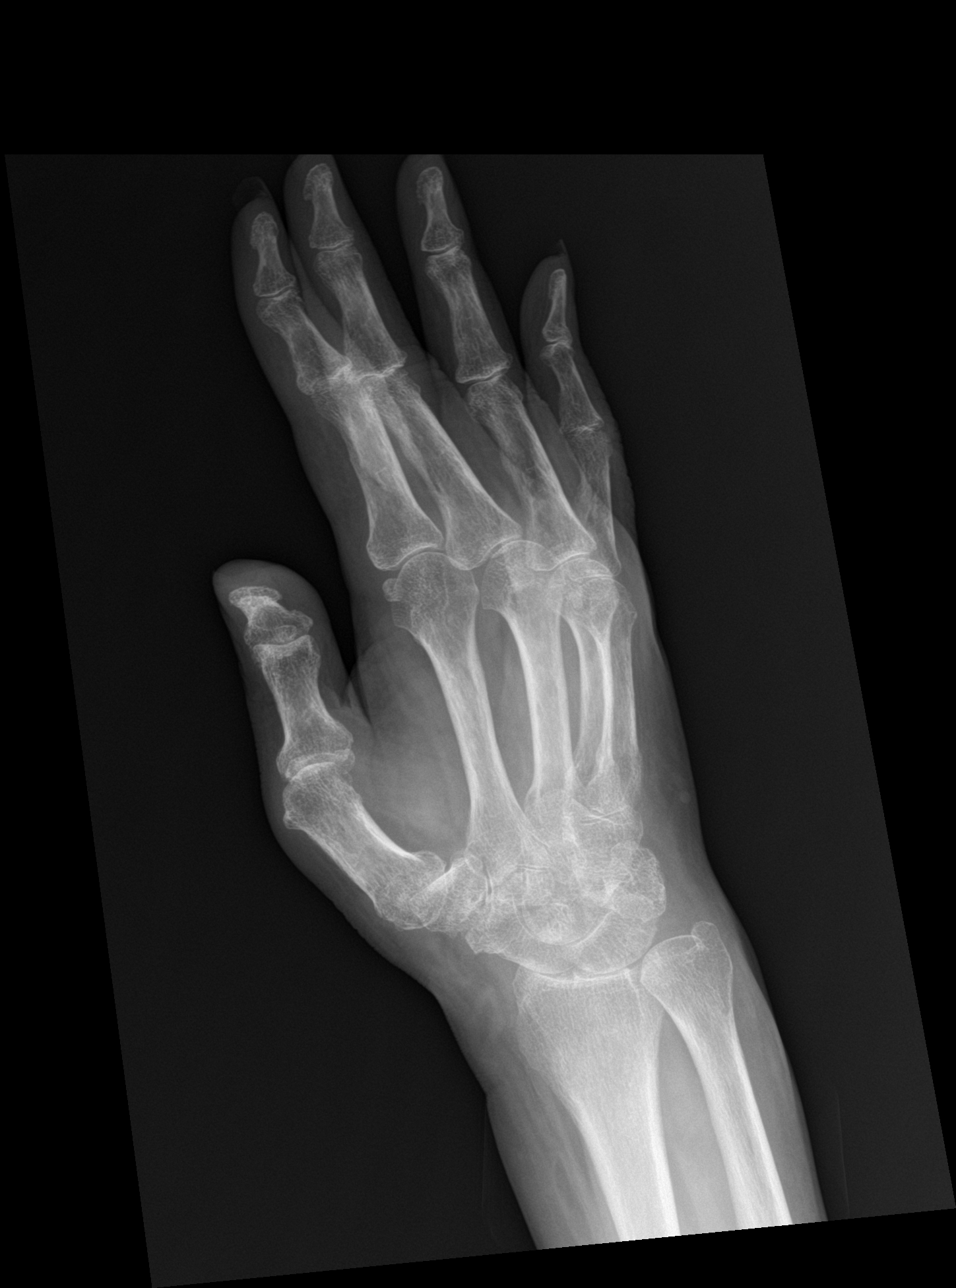

[Series 3: hand lat · 0.14mm/px · 2 of 2 slices shown]
[im 1/2]
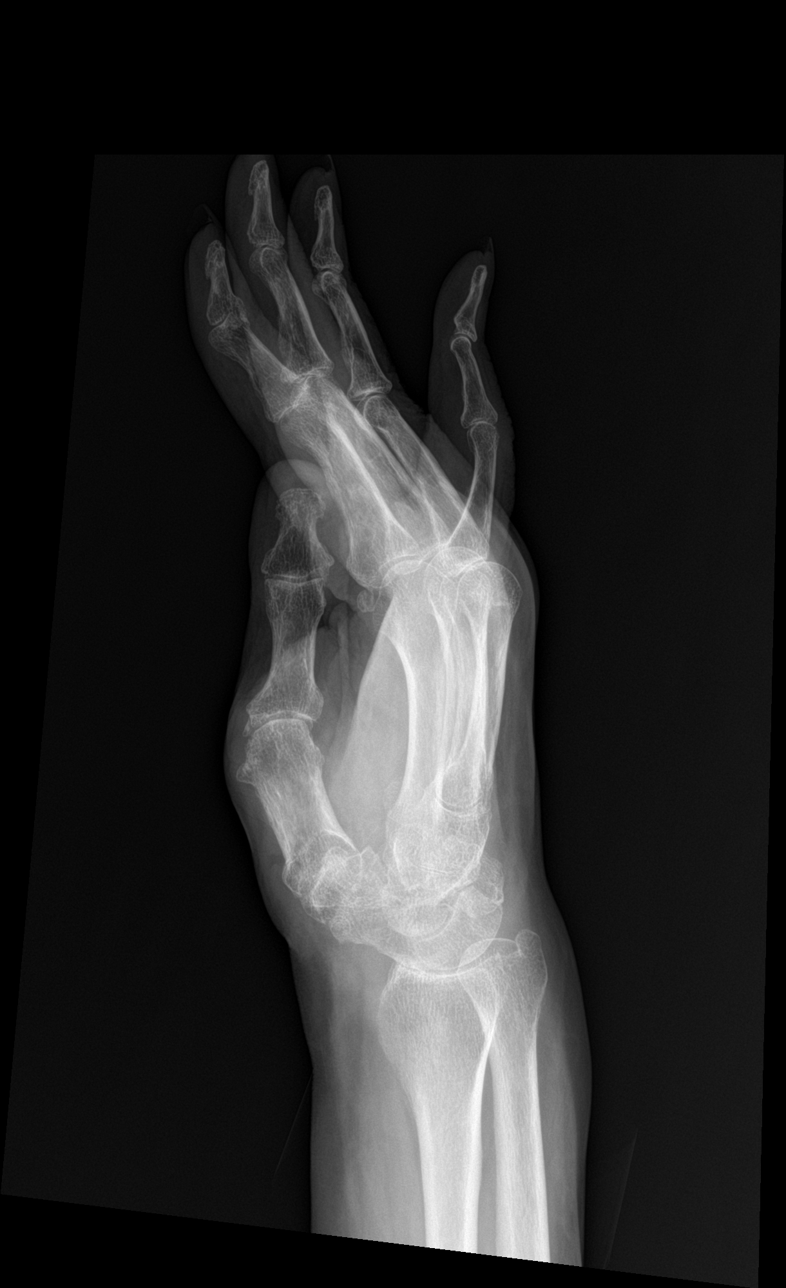
[im 2/2]
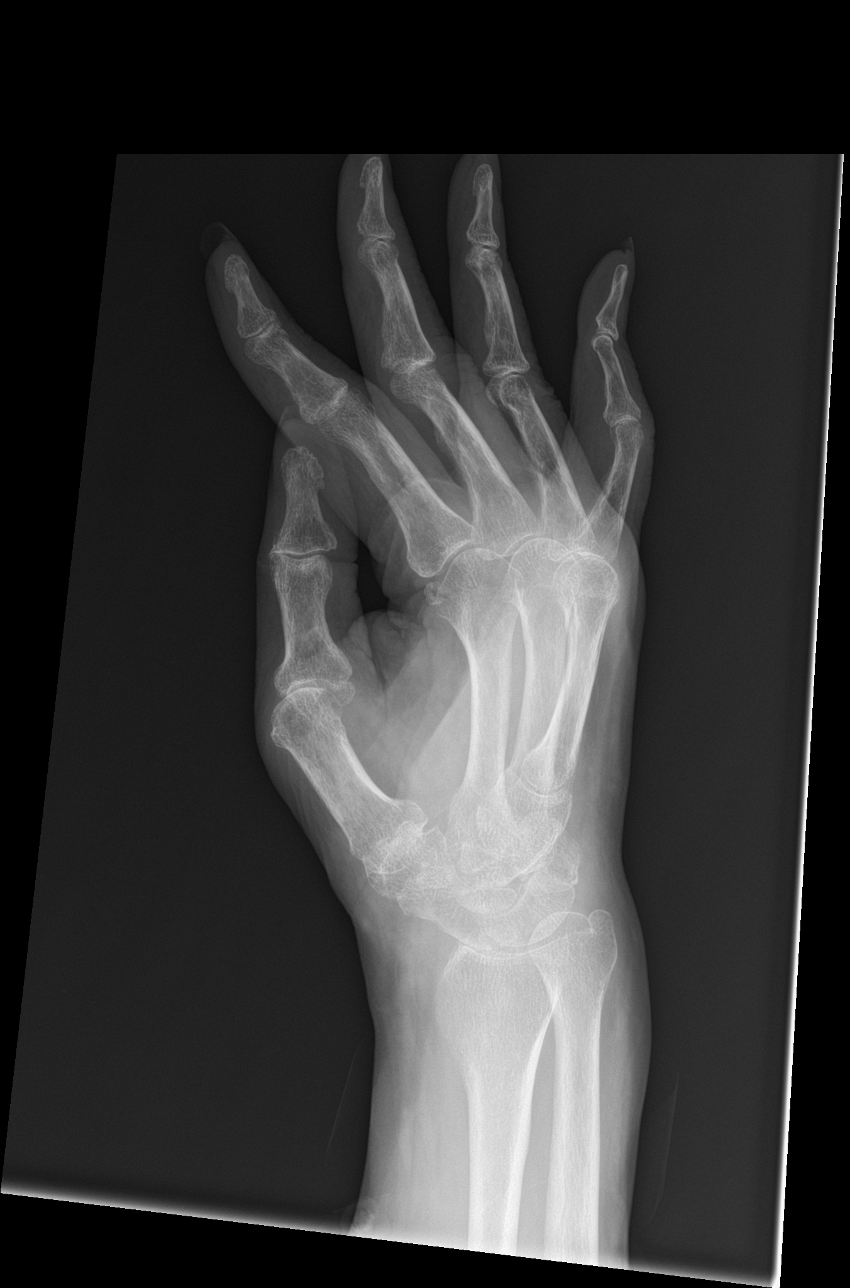

[4 of 4 positions shown; findings below may reference images not displayed]

FINDINGS: No acute fracture or dislocation is noted. Mild radiocarpal
degenerative change is seen. Mild interphalangeal degenerative
changes are noted as well as changes at the first CMC and MCP
joints. No bony erosive changes are seen. No other focal abnormality
is noted.
IMPRESSION: Mild osteoarthritic changes without acute abnormality.

## 2021-08-26 IMAGING — CT CT HEAD W/O CM
3 series · 15 of 46 positions shown, 18 images · non-contrast
Comparison: February 11, 2019.

CLINICAL DATA: Altered mental status.

EXAM:
CT HEAD WITHOUT CONTRAST
TECHNIQUE: Contiguous axial images were obtained from the base of the skull
through the vertex without intravenous contrast.

[Series 2: head wo · axial · 0.41mm/px · z∈[-138,-18]mm · 9 of 29 slices shown, 12 images]
[im 3/29  brain]
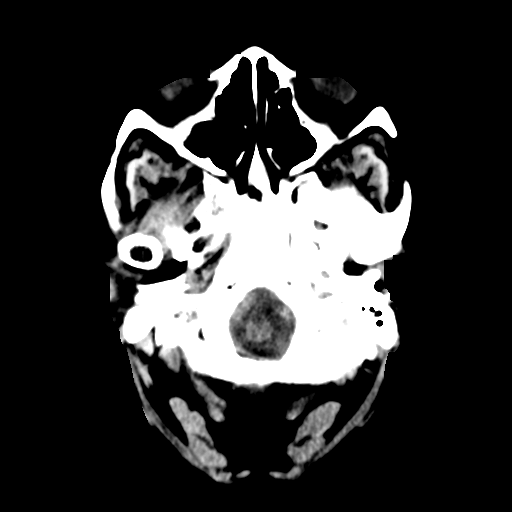
[im 3/29  bone]
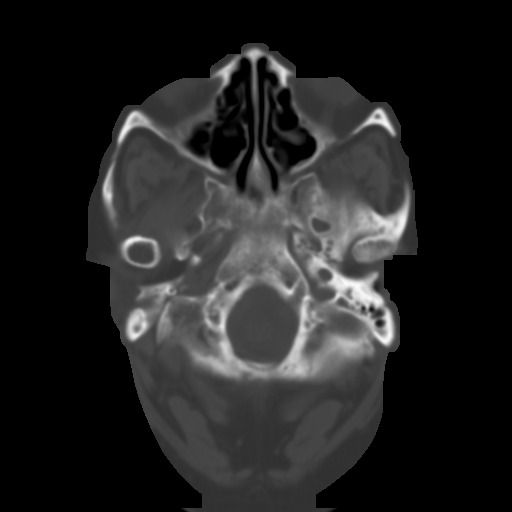
[im 6/29  brain]
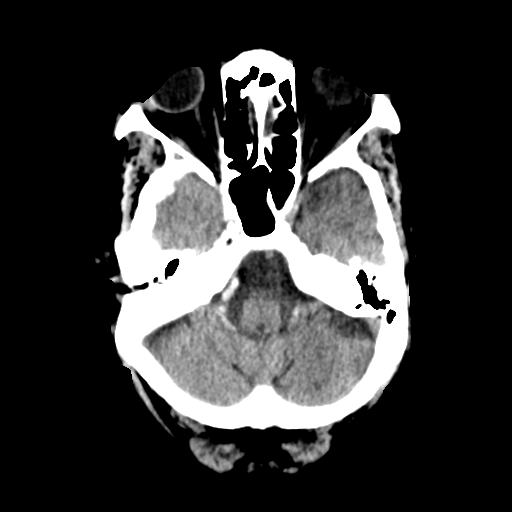
[im 9/29  brain]
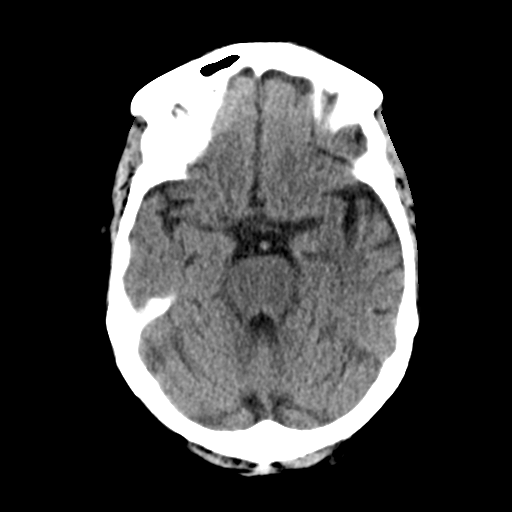
[im 12/29  brain]
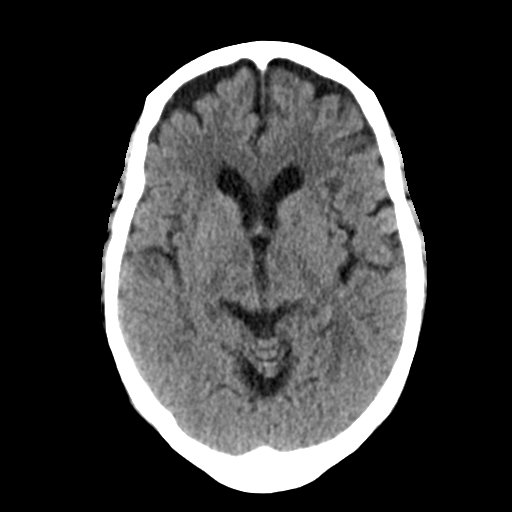
[im 15/29  brain]
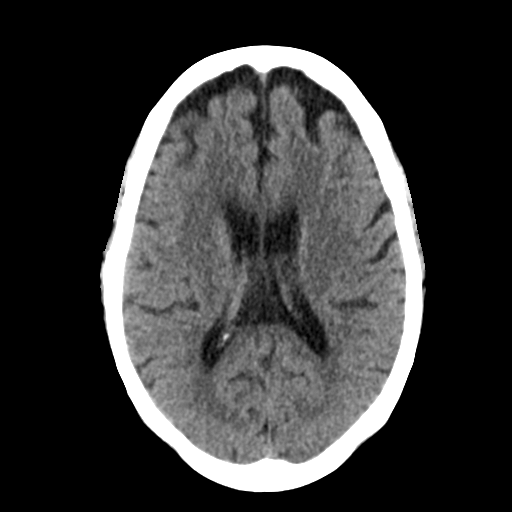
[im 15/29  bone]
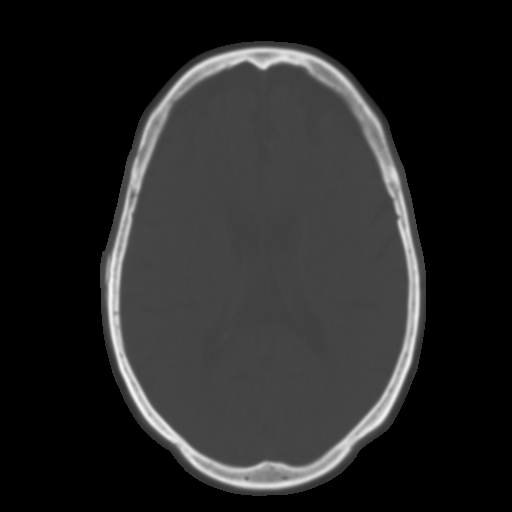
[im 18/29  brain]
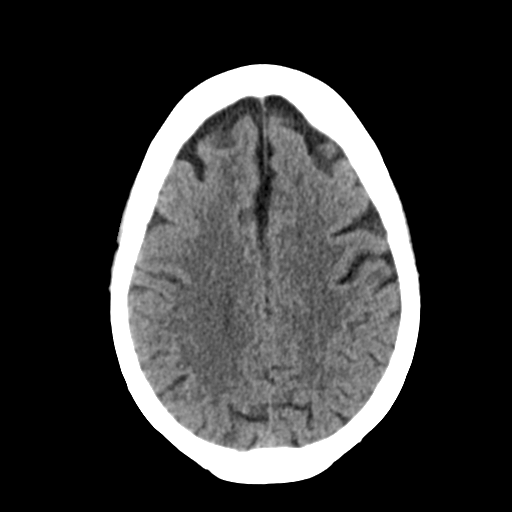
[im 21/29  brain]
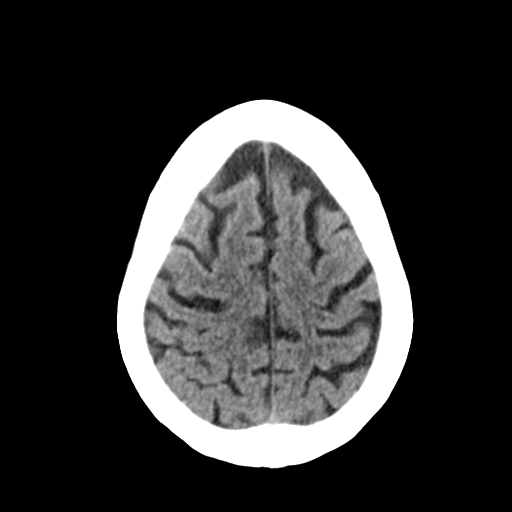
[im 24/29  brain]
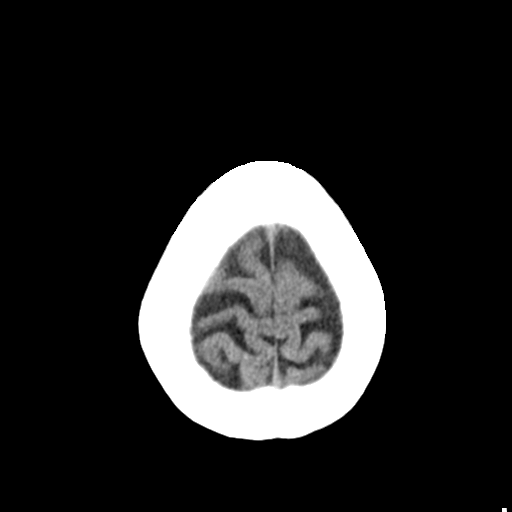
[im 27/29  brain]
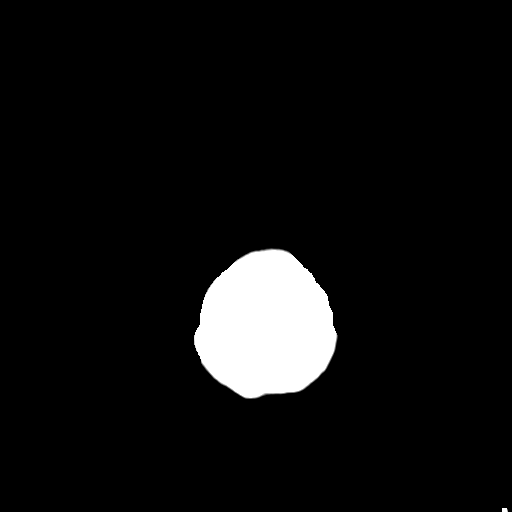
[im 27/29  bone]
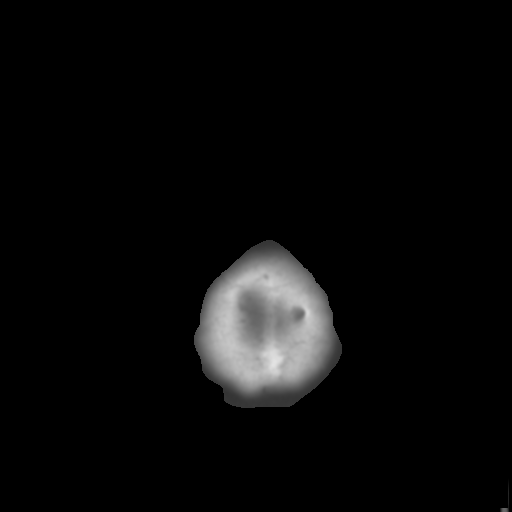

[Series 4: coronal soft tissue · coronal · 0.28mm/px · 3 of 67 slices shown]
[im 23/67  brain]
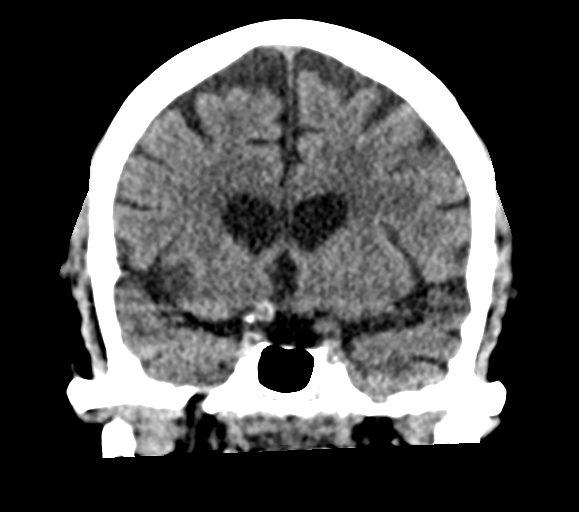
[im 30/67  brain]
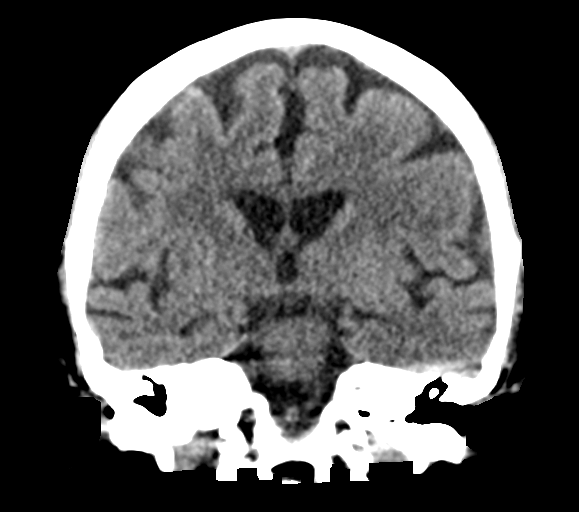
[im 37/67  brain]
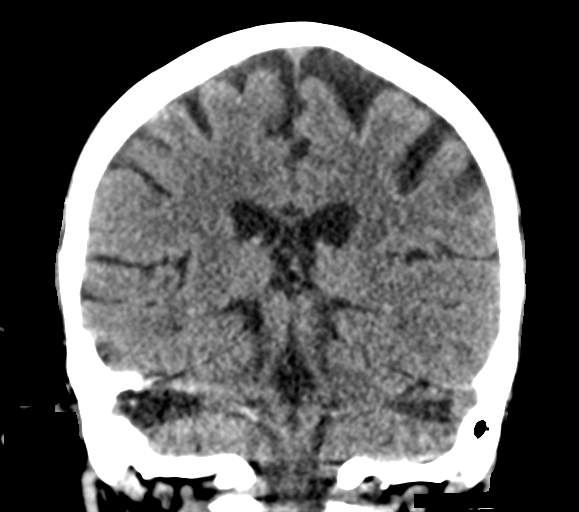

[Series 5: sagittal soft tissue · sagittal · 0.28mm/px · 3 of 55 slices shown]
[im 19/55  brain]
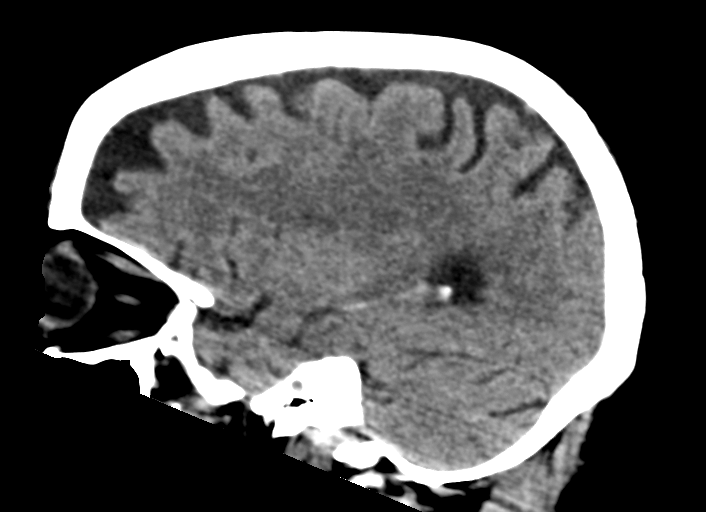
[im 28/55  brain]
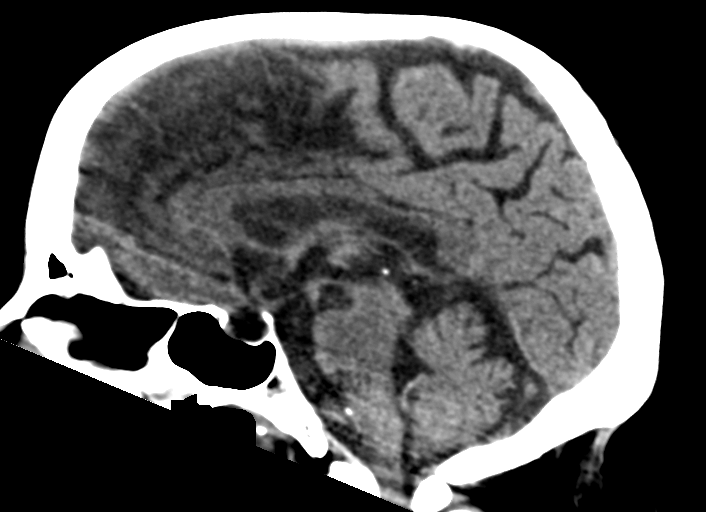
[im 37/55  brain]
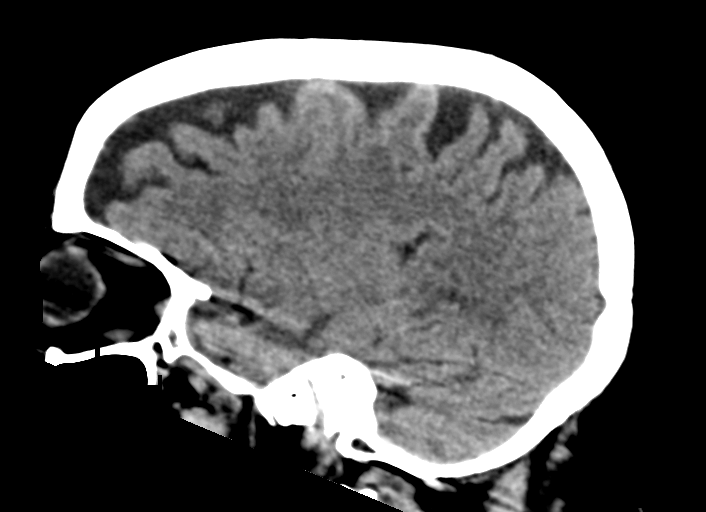

[15 of 46 positions shown; findings below may reference images not displayed]

FINDINGS: Brain: Mild diffuse cortical atrophy is noted. Mild chronic ischemic
white matter disease is noted. No mass effect or midline shift is
noted. Ventricular size is within normal limits. There is no
evidence of mass lesion, hemorrhage or acute infarction.

Vascular: No hyperdense vessel or unexpected calcification.

Skull: Normal. Negative for fracture or focal lesion.

Sinuses/Orbits: No acute finding.

Other: Fluid is noted in right mastoid air cells.
IMPRESSION: Mild diffuse cortical atrophy. Mild chronic ischemic white matter
disease. No acute intracranial abnormality seen.

## 2021-08-26 IMAGING — DX DG CHEST 1V PORT
1 series · 1 of 1 positions shown · non-contrast
Comparison: February 11, 2019.

CLINICAL DATA: Shortness of breath.

EXAM:
PORTABLE CHEST 1 VIEW

[chest ap]
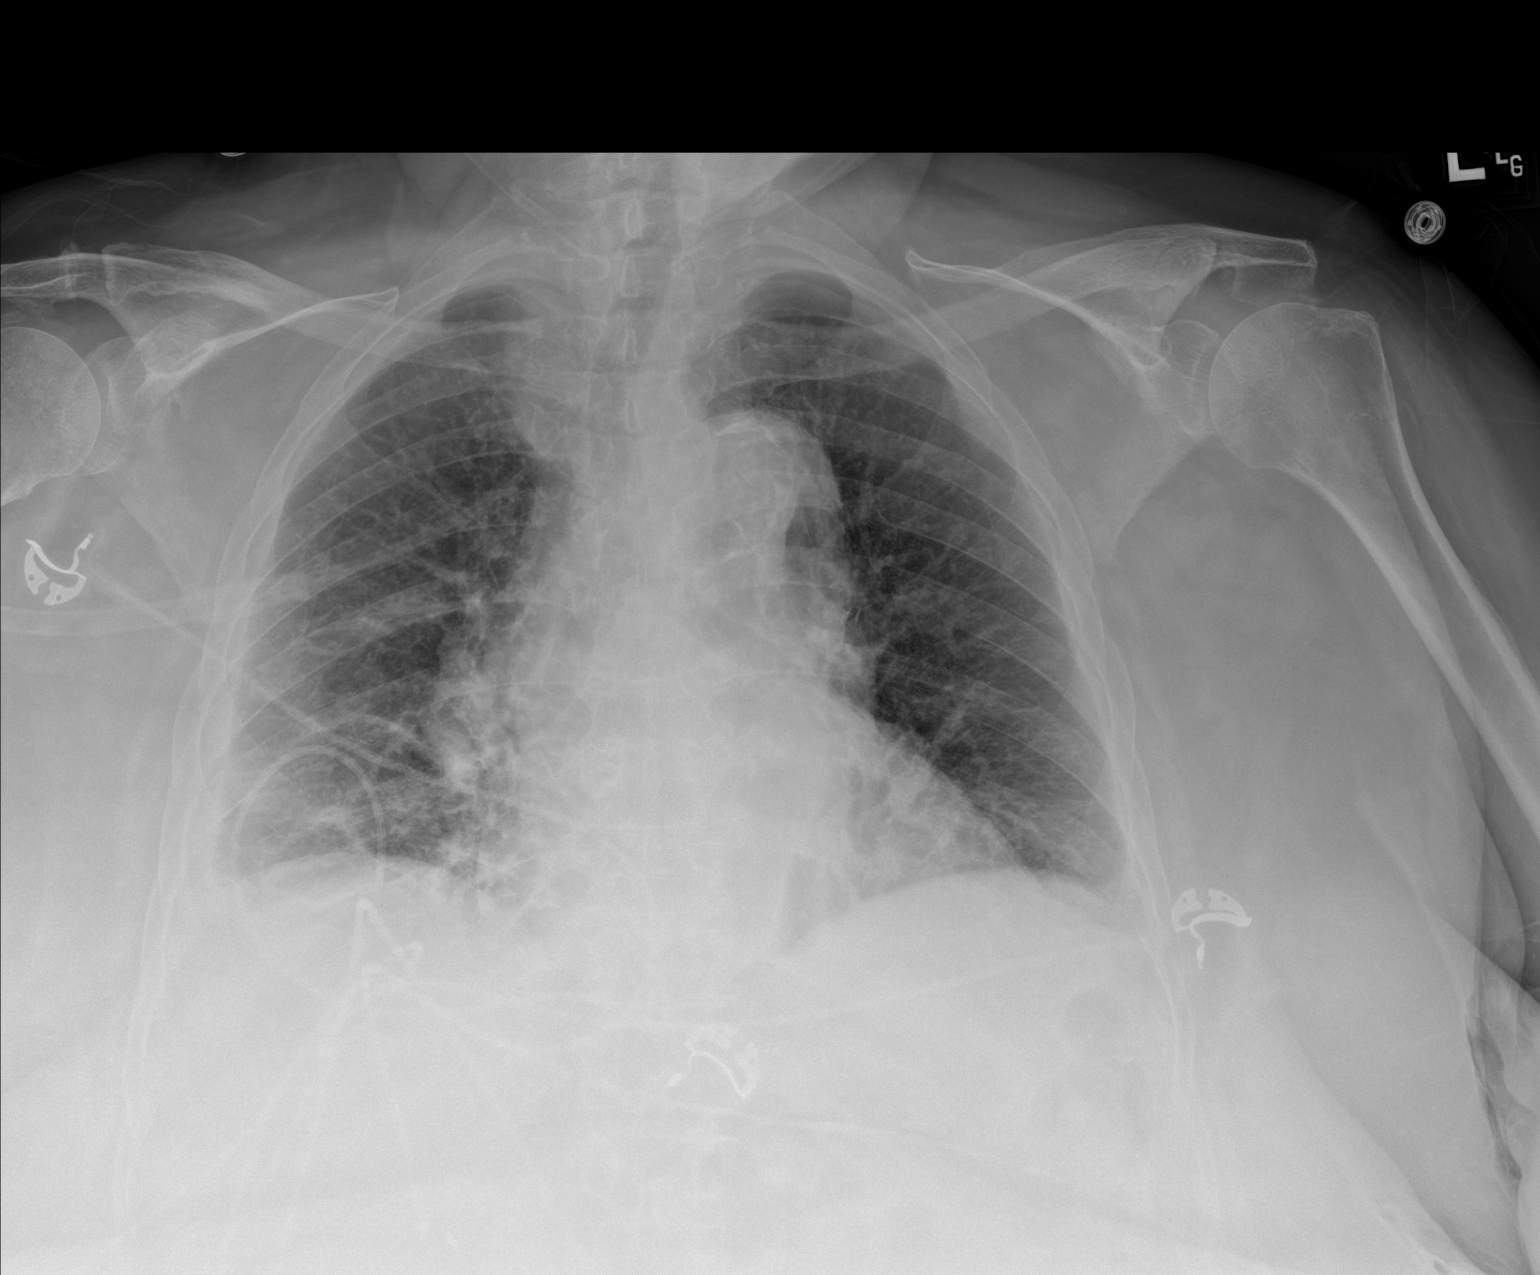

[1 of 1 positions shown; findings below may reference images not displayed]

FINDINGS: Stable cardiomediastinal silhouette. Atherosclerosis of thoracic
aorta is noted. No pneumothorax is noted. Mild bibasilar
subsegmental atelectasis or scarring is noted. Minimal right pleural
effusion or pleural thickening is noted. Bony thorax is
unremarkable.
IMPRESSION: Aortic atherosclerosis. Stable mild bibasilar subsegmental
atelectasis or scarring. Minimal right pleural effusion or pleural
thickening is noted.

## 2021-08-27 DIAGNOSIS — J449 Chronic obstructive pulmonary disease, unspecified: Secondary | ICD-10-CM | POA: Diagnosis not present

## 2021-08-27 DIAGNOSIS — R569 Unspecified convulsions: Secondary | ICD-10-CM | POA: Diagnosis not present

## 2021-09-01 DIAGNOSIS — R079 Chest pain, unspecified: Secondary | ICD-10-CM | POA: Diagnosis not present

## 2021-09-02 ENCOUNTER — Emergency Department
Admission: EM | Admit: 2021-09-02 | Discharge: 2021-09-05 | Disposition: A | Discharge status: Home / Self Care | Payer: Medicare HMO | Attending: Emergency Medicine | Admitting: Emergency Medicine

## 2021-09-02 ENCOUNTER — Emergency Department: Payer: Medicare HMO

## 2021-09-02 ENCOUNTER — Other Ambulatory Visit: Payer: Self-pay

## 2021-09-02 DIAGNOSIS — Z743 Need for continuous supervision: Secondary | ICD-10-CM | POA: Diagnosis not present

## 2021-09-02 DIAGNOSIS — E119 Type 2 diabetes mellitus without complications: Secondary | ICD-10-CM | POA: Insufficient documentation

## 2021-09-02 DIAGNOSIS — R0789 Other chest pain: Secondary | ICD-10-CM | POA: Diagnosis not present

## 2021-09-02 DIAGNOSIS — M6281 Muscle weakness (generalized): Secondary | ICD-10-CM | POA: Diagnosis not present

## 2021-09-02 DIAGNOSIS — R0902 Hypoxemia: Secondary | ICD-10-CM | POA: Diagnosis not present

## 2021-09-02 DIAGNOSIS — R079 Chest pain, unspecified: Secondary | ICD-10-CM | POA: Diagnosis not present

## 2021-09-02 DIAGNOSIS — J449 Chronic obstructive pulmonary disease, unspecified: Secondary | ICD-10-CM | POA: Insufficient documentation

## 2021-09-02 DIAGNOSIS — J9811 Atelectasis: Secondary | ICD-10-CM | POA: Diagnosis not present

## 2021-09-02 DIAGNOSIS — R0689 Other abnormalities of breathing: Secondary | ICD-10-CM | POA: Diagnosis not present

## 2021-09-02 DIAGNOSIS — R0602 Shortness of breath: Secondary | ICD-10-CM | POA: Diagnosis not present

## 2021-09-02 LAB — CBC
HCT: 36.1 % (ref 36.0–46.0)
Hemoglobin: 11 g/dL — ABNORMAL LOW (ref 12.0–15.0)
MCH: 29.3 pg (ref 26.0–34.0)
MCHC: 30.5 g/dL (ref 30.0–36.0)
MCV: 96 fL (ref 80.0–100.0)
Platelets: 154 10*3/uL (ref 150–400)
RBC: 3.76 MIL/uL — ABNORMAL LOW (ref 3.87–5.11)
RDW: 12.8 % (ref 11.5–15.5)
WBC: 4.9 10*3/uL (ref 4.0–10.5)
nRBC: 0 % (ref 0.0–0.2)

## 2021-09-02 LAB — BASIC METABOLIC PANEL
Anion gap: 5 (ref 5–15)
BUN: 22 mg/dL (ref 8–23)
CO2: 33 mmol/L — ABNORMAL HIGH (ref 22–32)
Calcium: 9 mg/dL (ref 8.9–10.3)
Chloride: 102 mmol/L (ref 98–111)
Creatinine, Ser: 1.84 mg/dL — ABNORMAL HIGH (ref 0.44–1.00)
GFR, Estimated: 28 mL/min — ABNORMAL LOW (ref >60)
Glucose, Bld: 113 mg/dL — ABNORMAL HIGH (ref 70–99)
Potassium: 4.8 mmol/L (ref 3.5–5.1)
Sodium: 140 mmol/L (ref 135–145)

## 2021-09-02 LAB — TROPONIN I (HIGH SENSITIVITY)
Troponin I (High Sensitivity): 6 ng/L (ref <18)
Troponin I (High Sensitivity): 6 ng/L (ref <18)

## 2021-09-02 NOTE — ED Triage Notes (Signed)
[facility] sent pt out for an abnormal ekg that was done yesterday, pt denies pain today. 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

## 2021-09-02 NOTE — ED Provider Notes (Signed)
[facility] Provider Note    Event Date/Time   First MD Initiated Contact with Patient 09/02/21 1635     (approximate)   History   No chief complaint on file.   HPI  Katherine [name] is a 81 y.o. 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  Patient does not recall this although I suspect this may be related to her baseline cognitive dysfunction.  She is denying any acute pain at this time including any chest pain, Donnell pain, back pain headache earache or sore throat.  She denies any other sick symptoms.  I attempted to reach [facility] x2 and the first attempt I stayed on the line for 1 minute and no one picked up and the second time to stay for 2 minutes and no one picked up.  No other history is immediately available on patient presentation.      Physical Exam  Triage Vital Signs: ED Triage Vitals  Enc Vitals Group     BP 09/02/21 1357 (!) 105/56     Pulse Rate 09/02/21 1357 (!) 58     Resp 09/02/21 1357 18     Temp 09/02/21 1357 97.8 F (36.6 C)     Temp Source 09/02/21 1357 Oral     SpO2 09/02/21 1357 93 %     Weight 09/02/21 1358 229 lb 4.5 oz (104 kg)     Height 09/02/21 1358 5' (1.524 m)     Head Circumference --      Peak Flow --      Pain Score 09/02/21 1358 0     Pain Loc --      Pain Edu? --      Excl. in [place] ? --     Most recent vital signs: Vitals:   09/02/21 1357  BP: (!) 105/56  Pulse: (!) 58  Resp: 18  Temp: 97.8 F (36.6 C)  SpO2: 93%    General: Awake, no distress.  CV:  Good peripheral perfusion.  2+ radial pulses.  Slightly bradycardic.  No significant murmur. Resp:  Normal effort.  Clear bilaterally.  No  tachypnea hypoxia or respiratory distress. Abd:  No distention.  Soft throughout. Other:  Patient is conversant and pleasant mildly confused proceed to baseline review of records.   ED Results / Procedures / Treatments  Labs (all labs ordered are listed, but only abnormal results are displayed) Labs Reviewed  BASIC METABOLIC PANEL - Abnormal; Notable for the following components:      Result Value   CO2 33 (*)    Glucose, Bld 113 (*)    Creatinine, Ser 1.84 (*)    GFR, Estimated 28 (*)    All other components within normal limits  CBC - Abnormal; Notable for the following components:   RBC 3.76 (*)    Hemoglobin 11.0 (*)    All other components within normal limits  TROPONIN I (HIGH SENSITIVITY)  TROPONIN I (HIGH SENSITIVITY)     EKG  ECG is remarkable sinus bradycardia with ventricular rate of 55, normal axis, nonspecific ST change in lead III versus artifact without other clear evidence of acute ischemia or significant arrhythmia.   RADIOLOGY  Chest x-ray my interpretation shows appears to be some chronic scarring  and calcification in the aorta with borderline cardiomegaly and some atelectasis without other clear acute process such as a focal consolidation, pneumothorax, large effusion.  I also reviewed radiology's interpretation.  PROCEDURES:  Critical Care performed: No  Procedures    MEDICATIONS ORDERED IN ED: Medications - No data to display   IMPRESSION / MDM / [place] / ED COURSE  I reviewed the triage vital signs and the nursing notes. Patient's presentation is most consistent with acute presentation with potential threat to life or bodily function.                               Differential diagnosis includes, but is not limited to ACS, GERD, MSK, pneumonia, thorax with overall lower suspicion at this time given she is denying any symptoms today with stable vitals for a dissection or PE.  No new oxygen requirement.  ECG is remarkable sinus  bradycardia with ventricular rate of 55, normal axis, nonspecific ST change in lead III versus artifact without other clear evidence of acute ischemia or significant arrhythmia.  Chest x-ray my interpretation shows appears to be some chronic scarring and calcification in the aorta with borderline cardiomegaly and some atelectasis without other clear acute process such as a focal consolidation, pneumothorax, large effusion.  I also reviewed radiology's interpretation.  CBC without leukocytosis and hemoglobin of 11.  Patient's baseline is 11.5-12.5.  BMP shows stable kidney function with creatinine of 1.84 compared to 1.72 months ago without any other significant acute electrolyte or metabolic derangements.  Troponin is nonelevated x2 and suggestive of an acute ACS event.  Given patient is denying any symptoms at this time with otherwise low suspicion for immediate life-threatening process I think she is appropriate for continued outpatient evaluation.  Discharged in stable condition.  Strict and precautions provided in writing.      FINAL CLINICAL IMPRESSION(S) / ED DIAGNOSES   Final diagnoses:  Chest pain, unspecified type     Rx / DC Orders   ED Discharge Orders     None        Note:  This document was prepared using Dragon voice recognition software and may include unintentional dictation errors.   Lucrezia Starch, MD 09/02/21 [phone]

## 2021-09-02 NOTE — ED Notes (Signed)
Attempted to give report to [facility] via telephone. Was transferred to a phone, but no one picked up.

## 2021-09-02 NOTE — ED Notes (Signed)
[facility]  CALLED FOR  TRANSPORT  TO    HEALTH  CARE 

## 2021-09-05 DIAGNOSIS — J029 Acute pharyngitis, unspecified: Secondary | ICD-10-CM | POA: Diagnosis not present

## 2021-09-06 DIAGNOSIS — J029 Acute pharyngitis, unspecified: Secondary | ICD-10-CM | POA: Diagnosis not present

## 2021-09-08 ENCOUNTER — Emergency Department: Payer: Medicare HMO

## 2021-09-08 ENCOUNTER — Emergency Department
Admission: EM | Admit: 2021-09-08 | Discharge: 2021-09-08 | Disposition: A | Discharge status: Skilled Nursing Facility | Payer: Medicare HMO | Attending: Emergency Medicine | Admitting: Emergency Medicine

## 2021-09-08 ENCOUNTER — Other Ambulatory Visit: Payer: Self-pay

## 2021-09-08 DIAGNOSIS — R9431 Abnormal electrocardiogram [ECG] [EKG]: Secondary | ICD-10-CM | POA: Diagnosis not present

## 2021-09-08 DIAGNOSIS — R5383 Other fatigue: Secondary | ICD-10-CM | POA: Diagnosis not present

## 2021-09-08 DIAGNOSIS — R410 Disorientation, unspecified: Secondary | ICD-10-CM | POA: Diagnosis not present

## 2021-09-08 DIAGNOSIS — R4182 Altered mental status, unspecified: Secondary | ICD-10-CM | POA: Insufficient documentation

## 2021-09-08 DIAGNOSIS — J029 Acute pharyngitis, unspecified: Secondary | ICD-10-CM | POA: Insufficient documentation

## 2021-09-08 DIAGNOSIS — N3090 Cystitis, unspecified without hematuria: Secondary | ICD-10-CM | POA: Insufficient documentation

## 2021-09-08 DIAGNOSIS — R4020 Unspecified coma: Secondary | ICD-10-CM | POA: Diagnosis not present

## 2021-09-08 DIAGNOSIS — Z20822 Contact with and (suspected) exposure to covid-19: Secondary | ICD-10-CM | POA: Diagnosis not present

## 2021-09-08 DIAGNOSIS — R5381 Other malaise: Secondary | ICD-10-CM | POA: Diagnosis not present

## 2021-09-08 DIAGNOSIS — E119 Type 2 diabetes mellitus without complications: Secondary | ICD-10-CM | POA: Insufficient documentation

## 2021-09-08 DIAGNOSIS — M6281 Muscle weakness (generalized): Secondary | ICD-10-CM | POA: Diagnosis not present

## 2021-09-08 DIAGNOSIS — J449 Chronic obstructive pulmonary disease, unspecified: Secondary | ICD-10-CM | POA: Diagnosis not present

## 2021-09-08 DIAGNOSIS — Z7401 Bed confinement status: Secondary | ICD-10-CM | POA: Diagnosis not present

## 2021-09-08 DIAGNOSIS — Z743 Need for continuous supervision: Secondary | ICD-10-CM | POA: Diagnosis not present

## 2021-09-08 DIAGNOSIS — R69 Illness, unspecified: Secondary | ICD-10-CM | POA: Diagnosis not present

## 2021-09-08 LAB — COMPREHENSIVE METABOLIC PANEL
ALT: 8 U/L (ref 0–44)
AST: 13 U/L — ABNORMAL LOW (ref 15–41)
Albumin: 3.3 g/dL — ABNORMAL LOW (ref 3.5–5.0)
Alkaline Phosphatase: 78 U/L (ref 38–126)
Anion gap: 6 (ref 5–15)
BUN: 30 mg/dL — ABNORMAL HIGH (ref 8–23)
CO2: 31 mmol/L (ref 22–32)
Calcium: 9 mg/dL (ref 8.9–10.3)
Chloride: 103 mmol/L (ref 98–111)
Creatinine, Ser: 1.72 mg/dL — ABNORMAL HIGH (ref 0.44–1.00)
GFR, Estimated: 30 mL/min — ABNORMAL LOW (ref >60)
Glucose, Bld: 103 mg/dL — ABNORMAL HIGH (ref 70–99)
Potassium: 4.4 mmol/L (ref 3.5–5.1)
Sodium: 140 mmol/L (ref 135–145)
Total Bilirubin: 0.7 mg/dL (ref 0.3–1.2)
Total Protein: 6.9 g/dL (ref 6.5–8.1)

## 2021-09-08 LAB — CBC
HCT: 34.2 % — ABNORMAL LOW (ref 36.0–46.0)
Hemoglobin: 10.8 g/dL — ABNORMAL LOW (ref 12.0–15.0)
MCH: 29.5 pg (ref 26.0–34.0)
MCHC: 31.6 g/dL (ref 30.0–36.0)
MCV: 93.4 fL (ref 80.0–100.0)
Platelets: 180 K/uL (ref 150–400)
RBC: 3.66 MIL/uL — ABNORMAL LOW (ref 3.87–5.11)
RDW: 12.5 % (ref 11.5–15.5)
WBC: 4.6 K/uL (ref 4.0–10.5)
nRBC: 0 % (ref 0.0–0.2)

## 2021-09-08 LAB — URINALYSIS, COMPLETE (UACMP) WITH MICROSCOPIC
Bilirubin Urine: NEGATIVE
Glucose, UA: NEGATIVE mg/dL
Ketones, ur: NEGATIVE mg/dL
Nitrite: NEGATIVE
Protein, ur: NEGATIVE mg/dL
Specific Gravity, Urine: 1.013 (ref 1.005–1.030)
WBC, UA: >50 WBC/hpf — ABNORMAL HIGH (ref 0–5)
pH: 5 (ref 5.0–8.0)

## 2021-09-08 LAB — RESP PANEL BY RT-PCR (FLU A&B, COVID) ARPGX2
Influenza A by PCR: NEGATIVE
Influenza B by PCR: NEGATIVE
SARS Coronavirus 2 by RT PCR: NEGATIVE

## 2021-09-08 LAB — BLOOD GAS, VENOUS
Acid-Base Excess: 7.9 mmol/L — ABNORMAL HIGH (ref 0.0–2.0)
Bicarbonate: 35.6 mmol/L — ABNORMAL HIGH (ref 20.0–28.0)
O2 Saturation: 83 %
Patient temperature: 37
pCO2, Ven: 63 mmHg — ABNORMAL HIGH (ref 44–60)
pH, Ven: 7.36 (ref 7.25–7.43)
pO2, Ven: 52 mmHg — ABNORMAL HIGH (ref 32–45)

## 2021-09-08 LAB — AMMONIA: Ammonia: 26 umol/L (ref 9–35)

## 2021-09-08 LAB — TROPONIN I (HIGH SENSITIVITY): Troponin I (High Sensitivity): 7 ng/L (ref <18)

## 2021-09-08 LAB — GROUP A STREP BY PCR: Group A Strep by PCR: NOT DETECTED

## 2021-09-08 LAB — TSH: TSH: 1.086 u[IU]/mL (ref 0.350–4.500)

## 2021-09-08 MED ORDER — CEPHALEXIN 125 MG/5ML PO SUSR: 500.0000 mg | Freq: Two times a day (BID) | ORAL | 0 refills | Status: AC

## 2021-09-08 MED ORDER — SODIUM CHLORIDE 0.9 % IV SOLN
1.0000 g | Freq: Once | INTRAVENOUS | Status: AC
  Administered 2021-09-08: 1 g via INTRAVENOUS
  Filled 2021-09-08: qty 10

## 2021-09-08 NOTE — ED Notes (Signed)
Pt to CT. All labs were sent including extra blood tubes as described in previous blank note.

## 2021-09-08 NOTE — ED Notes (Addendum)
Sent extra lt green and purple tubes, also blue, red, grey, and 1 set cultures.

## 2021-09-08 NOTE — ED Notes (Signed)
[facility]  called  for  transport  to  [facility]

## 2021-09-08 NOTE — ED Provider Notes (Signed)
Received in signout from Dr. Tamala Julian.  Patient presenting for acute on chronic confusion.  According family member who states that he is leaving to go home states that she is back at her baseline.  States that she will get symptoms like this from time to time with urinary tract infection.  Her work-up was largely unremarkable.  She is tolerating p.o.  Does not meet septic criteria.  Imaging is reassuring.  Chest x-ray that is more consistent with atelectasis than pneumonia given no hypoxia or respiratory symptoms.  I will cover with antibiotics for probable cystitis.  On review of recent cultures she did grew out E. coli that was pansensitive.   Merlyn Lot, MD 09/08/21 1651

## 2021-09-08 NOTE — ED Triage Notes (Signed)
Pt arrives via EMS from facility. They called stating "just isn't acting right" . Pt alert and oriented x 3 states she is Ok just her throat hurts. \

## 2021-09-08 NOTE — ED Notes (Signed)
Pt resting while waiting for transport, no needs at this time.

## 2021-09-08 NOTE — ED Provider Notes (Signed)
[facility] Provider Note    Event Date/Time   First MD Initiated Contact with Patient 09/08/21 1410     (approximate)   History   Altered Mental Status   HPI  Katherine [name] is a 81 y.o. female  with a PMH of DM, COPD associate with chronic hypoxic respiratory failure on 2 L chronically, obstructive sleep apnea, seizure disorder, recurrent aphasia, cognitive and neurobehavioral dysfunction, chronic esotropia of the left eye and chronic hearing loss who presents accompanied by spouse for evaluation of sore throat and report from EMS of "not acting right" per facility.  Patient is oriented to month year and location.  She states she has sore throat.  She is denying any other acute pain.  Husband confirms that she is nonambulatory at baseline.  She is a little bit sleepy but easily arousable.  She is denying any cough, chest pain, shortness of breath or fevers.  Unclear if she has any difficulty swallowing.      Physical Exam  Triage Vital Signs: ED Triage Vitals  Enc Vitals Group     BP 09/08/21 1422 136/88     Pulse Rate 09/08/21 1422 (!) 56     Resp 09/08/21 1422 18     Temp 09/08/21 1422 97.7 F (36.5 C)     Temp Source 09/08/21 1422 Axillary     SpO2 09/08/21 1422 99 %     Weight 09/08/21 1423 244 lb 11.4 oz (111 kg)     Height 09/08/21 1423 5\' 4"  (1.626 m)     Head Circumference --      Peak Flow --      Pain Score --      Pain Loc --      Pain Edu? --      Excl. in [place]? --     Most recent vital signs: Vitals:   09/08/21 1422  BP: 136/88  Pulse: (!) 56  Resp: 18  Temp: 97.7 F (36.5 C)  SpO2: 99%    General: Awake, no distress.  CV:  Good peripheral perfusion.  2+ radial pulses.  Slightly bradycardic. Resp:  Normal effort.  Clear bilaterally. Abd:  No distention.  Soft. Other:  Left eye esotropia is noted.  Otherwise cranial nerves with exception of decreased hearing are grossly intact.  Patient has mild posterior oropharyngeal  edema abdominal to fully visualize her tonsils are pillars and she has a fairly significant gag reflex and a large tongue attempted tongue depression.  There is no stridor over the neck.  She is oriented to month and year but not otherwise.  Spouse at bedside is not sure if this is baseline or not.  She does seem a little sleepier when I last evaluated her on 7/21.  ED Results / Procedures / Treatments  Labs (all labs ordered are listed, but only abnormal results are displayed) Labs Reviewed  COMPREHENSIVE METABOLIC PANEL - Abnormal; Notable for the following components:      Result Value   Glucose, Bld 103 (*)    BUN 30 (*)    Creatinine, Ser 1.72 (*)    Albumin 3.3 (*)    AST 13 (*)    GFR, Estimated 30 (*)    All other components within normal limits  CBC - Abnormal; Notable for the following components:   RBC 3.66 (*)    Hemoglobin 10.8 (*)    HCT 34.2 (*)    All other components within normal limits  BLOOD GAS, VENOUS -  Abnormal; Notable for the following components:   pCO2, Ven 63 (*)    pO2, Ven 52 (*)    Bicarbonate 35.6 (*)    Acid-Base Excess 7.9 (*)    All other components within normal limits  RESP PANEL BY RT-PCR (FLU A&B, COVID) ARPGX2  GROUP A STREP BY PCR  TSH  AMMONIA  URINALYSIS, COMPLETE (UACMP) WITH MICROSCOPIC  CBG MONITORING, ED  TROPONIN I (HIGH SENSITIVITY)     EKG  EKG is remarkable for A-fib with a ventricular rate of 57 and some artifact versus nonspecific change in lead I, lead III and aVF without other clear evidence of acute ischemia.   RADIOLOGY  CT head without evidence of acute ischemia, edema, hemorrhage, mass effect or other acute intracranial process.  I also reviewed radiology's interpretation.  Chest x-ray my interpretation shows cardiomegaly and some stable appearing atelectasis at the lung bases.  I reviewed radiology interpretation and agree with the findings   PROCEDURES:  Critical Care performed: No  .1-3 Lead EKG  Interpretation  Performed by: Lucrezia Starch, MD Authorized by: Lucrezia Starch, MD     Interpretation: non-specific     ECG rate assessment: bradycardic     Rhythm: atrial fibrillation     Ectopy: none     The patient is on the cardiac monitor to evaluate for evidence of arrhythmia and/or significant heart rate changes.   MEDICATIONS ORDERED IN ED: Medications - No data to display   IMPRESSION / MDM / [place] / ED COURSE  I reviewed the triage vital signs and the nursing notes. Patient's presentation is most consistent with acute presentation with potential threat to life or bodily function.                               Differential diagnosis includes, but is not limited to strep pharyngitis, viral pharyngitis, atypical anginal presentation, tonsillitis.  Unclear if she is slightly more altered than usual.  Considerations for this include possible head bleed, acute on chronic hypercarbic respiratory failure, hepatic encephalopathy and other metabolic derangement.  She does not seem septic.  She does not have any clear acute neurodeficits to suggest a stroke.  EKG is remarkable for A-fib with a ventricular rate of 57 and some artifact versus nonspecific change in lead I, lead III and aVF without other clear evidence of acute ischemia..  CT head without evidence of acute ischemia, edema, hemorrhage, mass effect or other acute intracranial process.  I also reviewed radiology's interpretation.  Chest x-ray my interpretation shows cardiomegaly and some stable appearing atelectasis at the lung bases.  I reviewed radiology interpretation and agree with the findings  VBG with a pH of 7.36 and bicarb of 35.6 with a PCO2 of 63 consistent with compensated chronic respiratory acidosis.  Ammonia is WNL.  CMP remarkable for a creatinine of 1.72 compared to 1.836 days ago without any other acute electrolyte or metabolic derangements.  Normal LFTs.  CBC without leukocytosis and stable  anemia with hemoglobin of 10.8 compared to 11 6 days ago.  Normal platelets.  Strep screen is negative.  Rapid strep screen is negative  I did review prior records and spoke with staff at facility.  Patient was recently admitted this year and discharged on 5/9 and on discharge summary at that time patient was noted to be oriented to self only.  When I evaluated patient emergency room on 7/21 she was also  oriented to self only.  She actually seems slightly more oriented today.  Staff at facility stated they felt she was slightly more confused than usual but they were not exactly sure as the provider had initially evaluate patient was no longer at the facility and I was able to reach this person.  It is my impression the patient essentially at her neurological baseline at this time do not believe she requires further imaging of her head or other diagnostic blood work at this time.  Care patient signed over to sending provider approximately 0 700 with plan to follow-up urine studies and bedside swallow exam.  If patient is able to swallow and urine is unremarkable she can safely be discharged with continue outpatient valuation back to nursing facility.      FINAL CLINICAL IMPRESSION(S) / ED DIAGNOSES   Final diagnoses:  Sore throat  Confusion     Rx / DC Orders   ED Discharge Orders     None        Note:  This document was prepared using Dragon voice recognition software and may include unintentional dictation errors.   Lucrezia Starch, MD 09/08/21 [phone]

## 2021-09-11 IMAGING — CT CT HEAD W/O CM
1 series · 16 of 28 positions shown, 20 images · non-contrast
Comparison: February 14, 2019.

CLINICAL DATA: Altered mental status.

EXAM:
CT HEAD WITHOUT CONTRAST
TECHNIQUE: Contiguous axial images were obtained from the base of the skull
through the vertex without intravenous contrast.

[Series 3: head bone · axial · 0.47mm/px · z∈[+515,+640]mm · 16 of 28 slices shown, 20 images]
[im 2/28  brain]
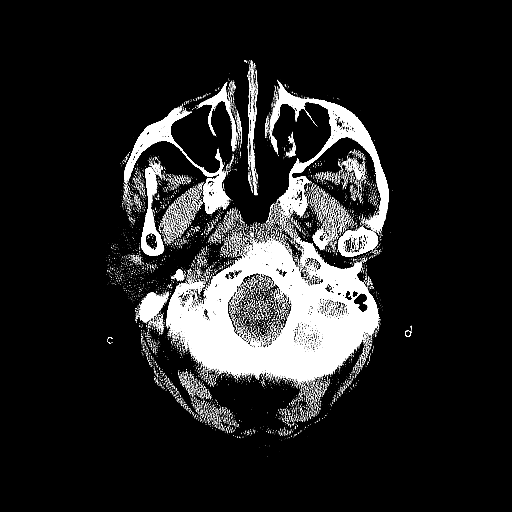
[im 2/28  bone]
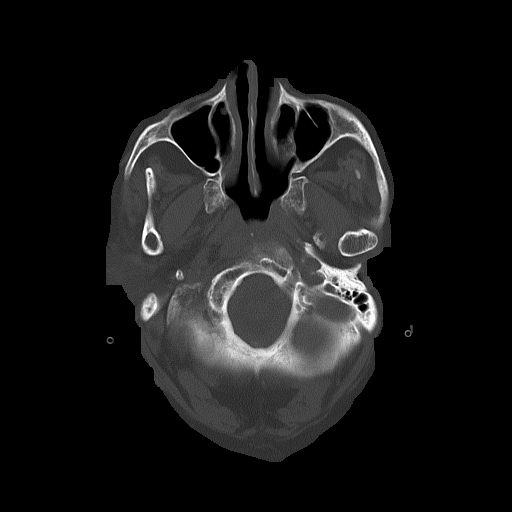
[im 4/28  brain]
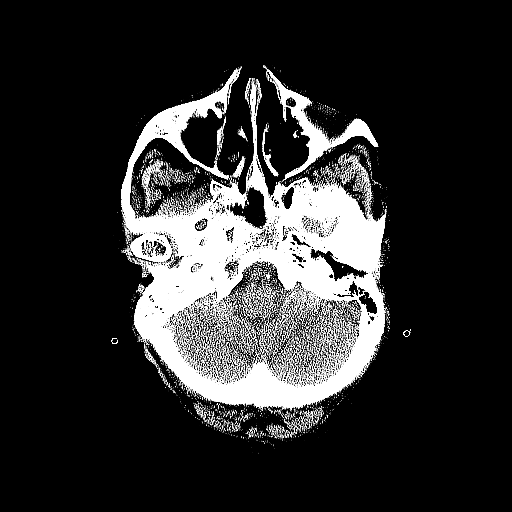
[im 6/28  brain]
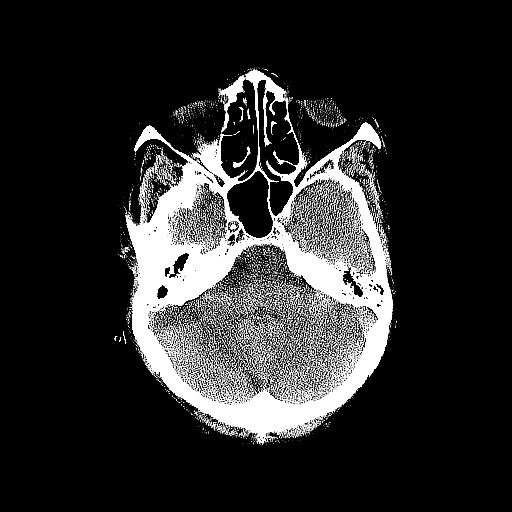
[im 7/28  brain]
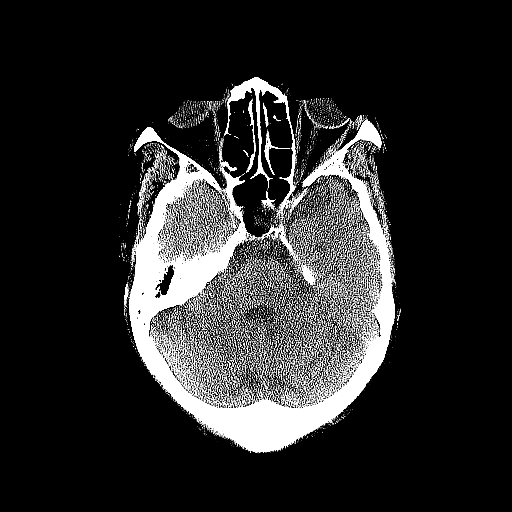
[im 9/28  brain]
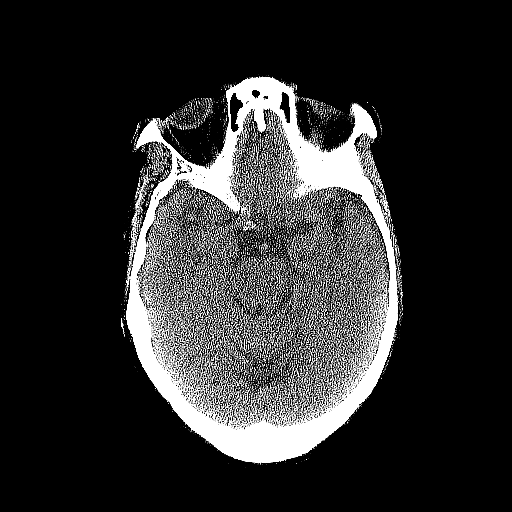
[im 9/28  bone]
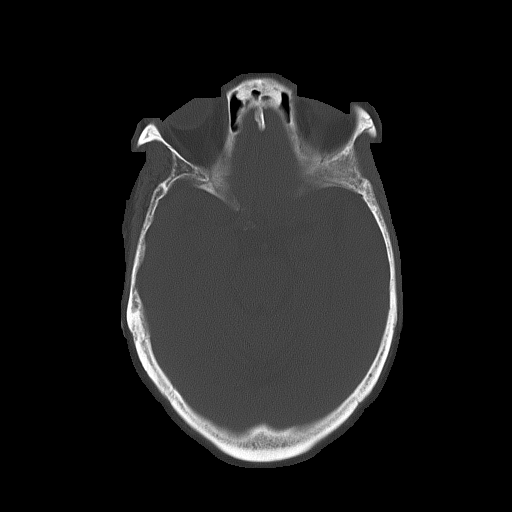
[im 10/28  brain]
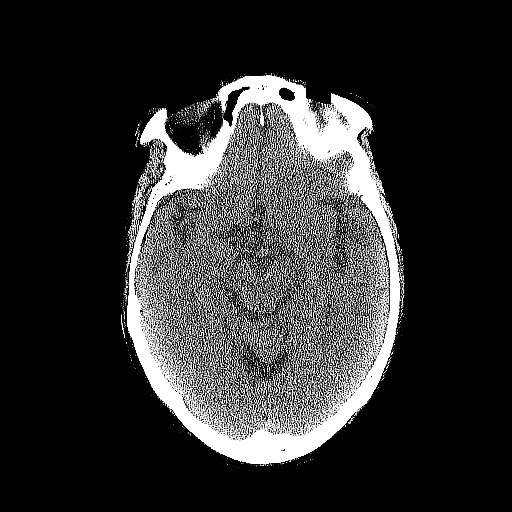
[im 12/28  brain]
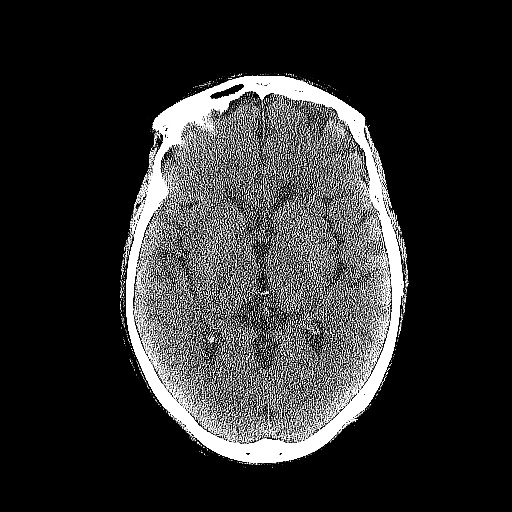
[im 14/28  brain]
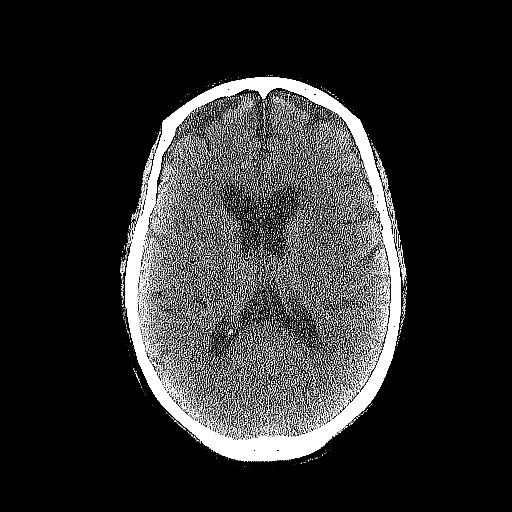
[im 15/28  brain]
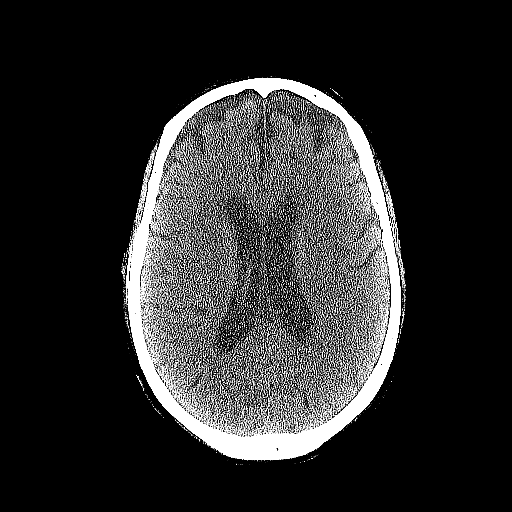
[im 15/28  bone]
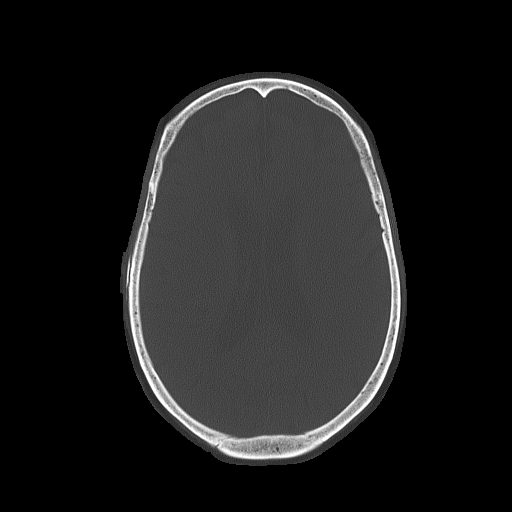
[im 17/28  brain]
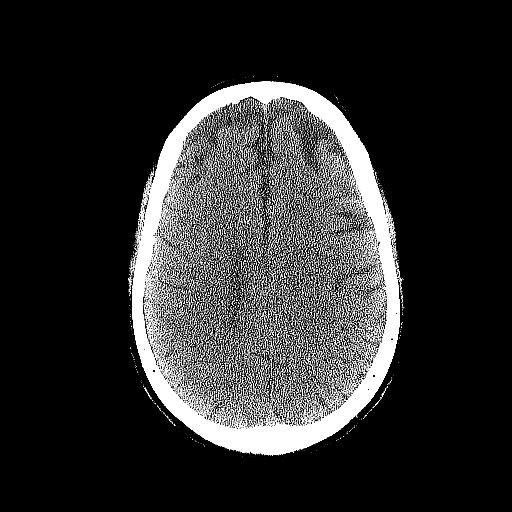
[im 19/28  brain]
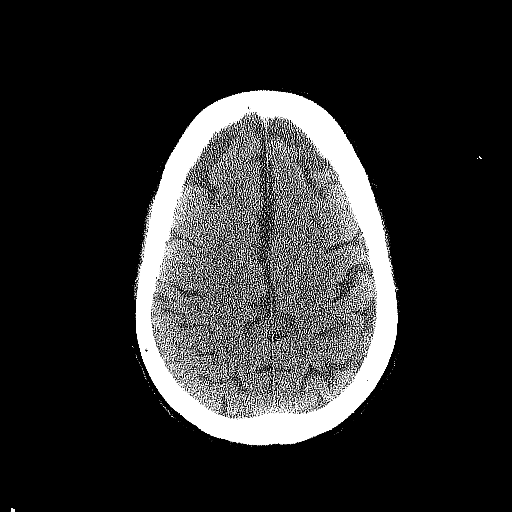
[im 20/28  brain]
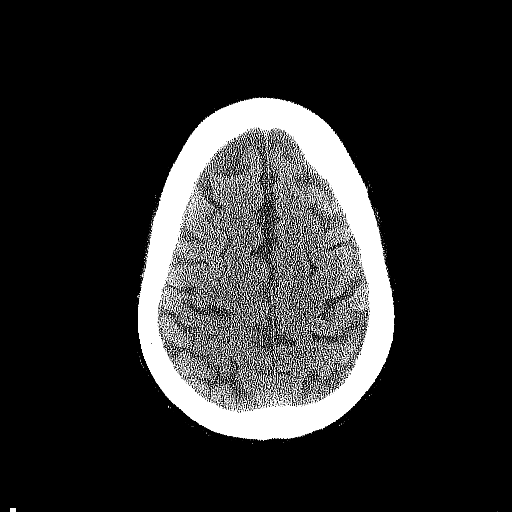
[im 22/28  brain]
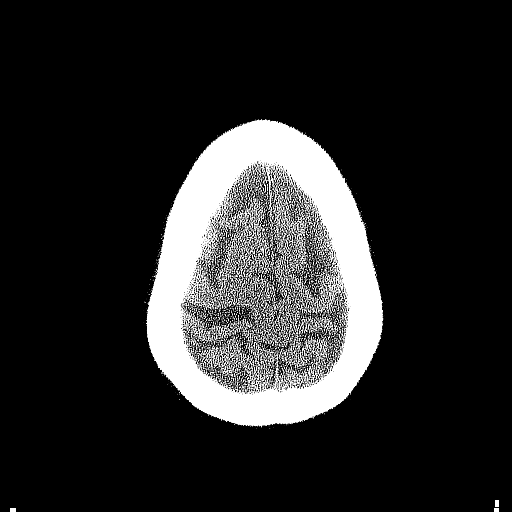
[im 22/28  bone]
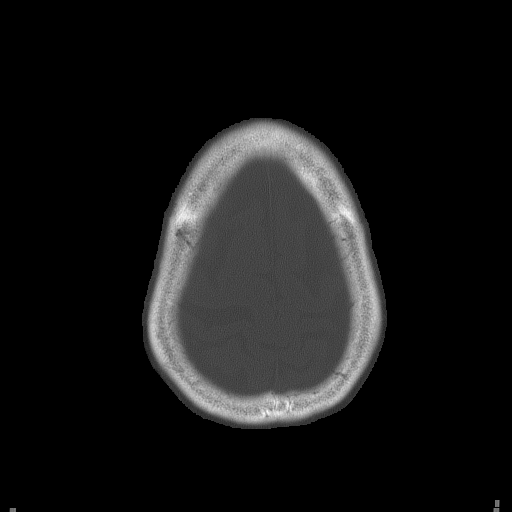
[im 23/28  brain]
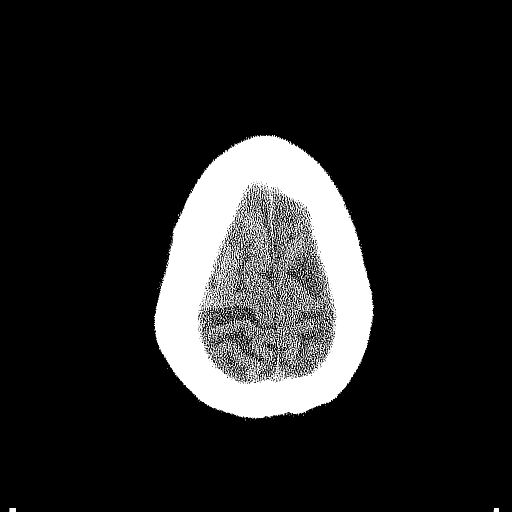
[im 25/28  brain]
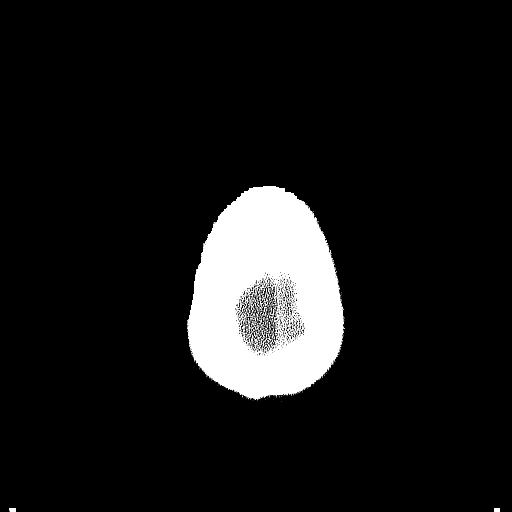
[im 27/28  brain]
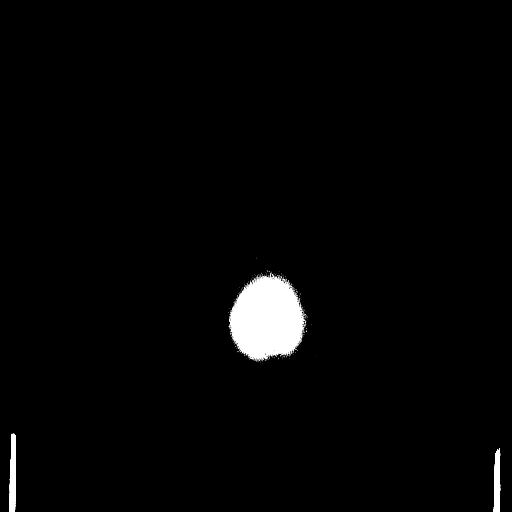

[16 of 28 positions shown; findings below may reference images not displayed]

FINDINGS: Brain: Mild chronic ischemic white matter disease is noted. Mild
diffuse cortical atrophy is noted. No mass effect or midline shift
is noted. Ventricular size is within normal limits. There is no
evidence of mass lesion, hemorrhage or acute infarction.

Vascular: No hyperdense vessel or unexpected calcification.

Skull: Normal. Negative for fracture or focal lesion.

Sinuses/Orbits: No acute finding.

Other: None.
IMPRESSION: Mild chronic ischemic white matter disease. Mild diffuse cortical
atrophy. No acute intracranial abnormality seen.

## 2021-09-11 IMAGING — DX DG CHEST 1V PORT
1 series · 1 of 1 positions shown · non-contrast
Comparison: 02/14/2019, CT 12/20/2018

CLINICAL DATA: COVID positive.  Altered mental status.

EXAM:
PORTABLE CHEST 1 VIEW

[chest ap]
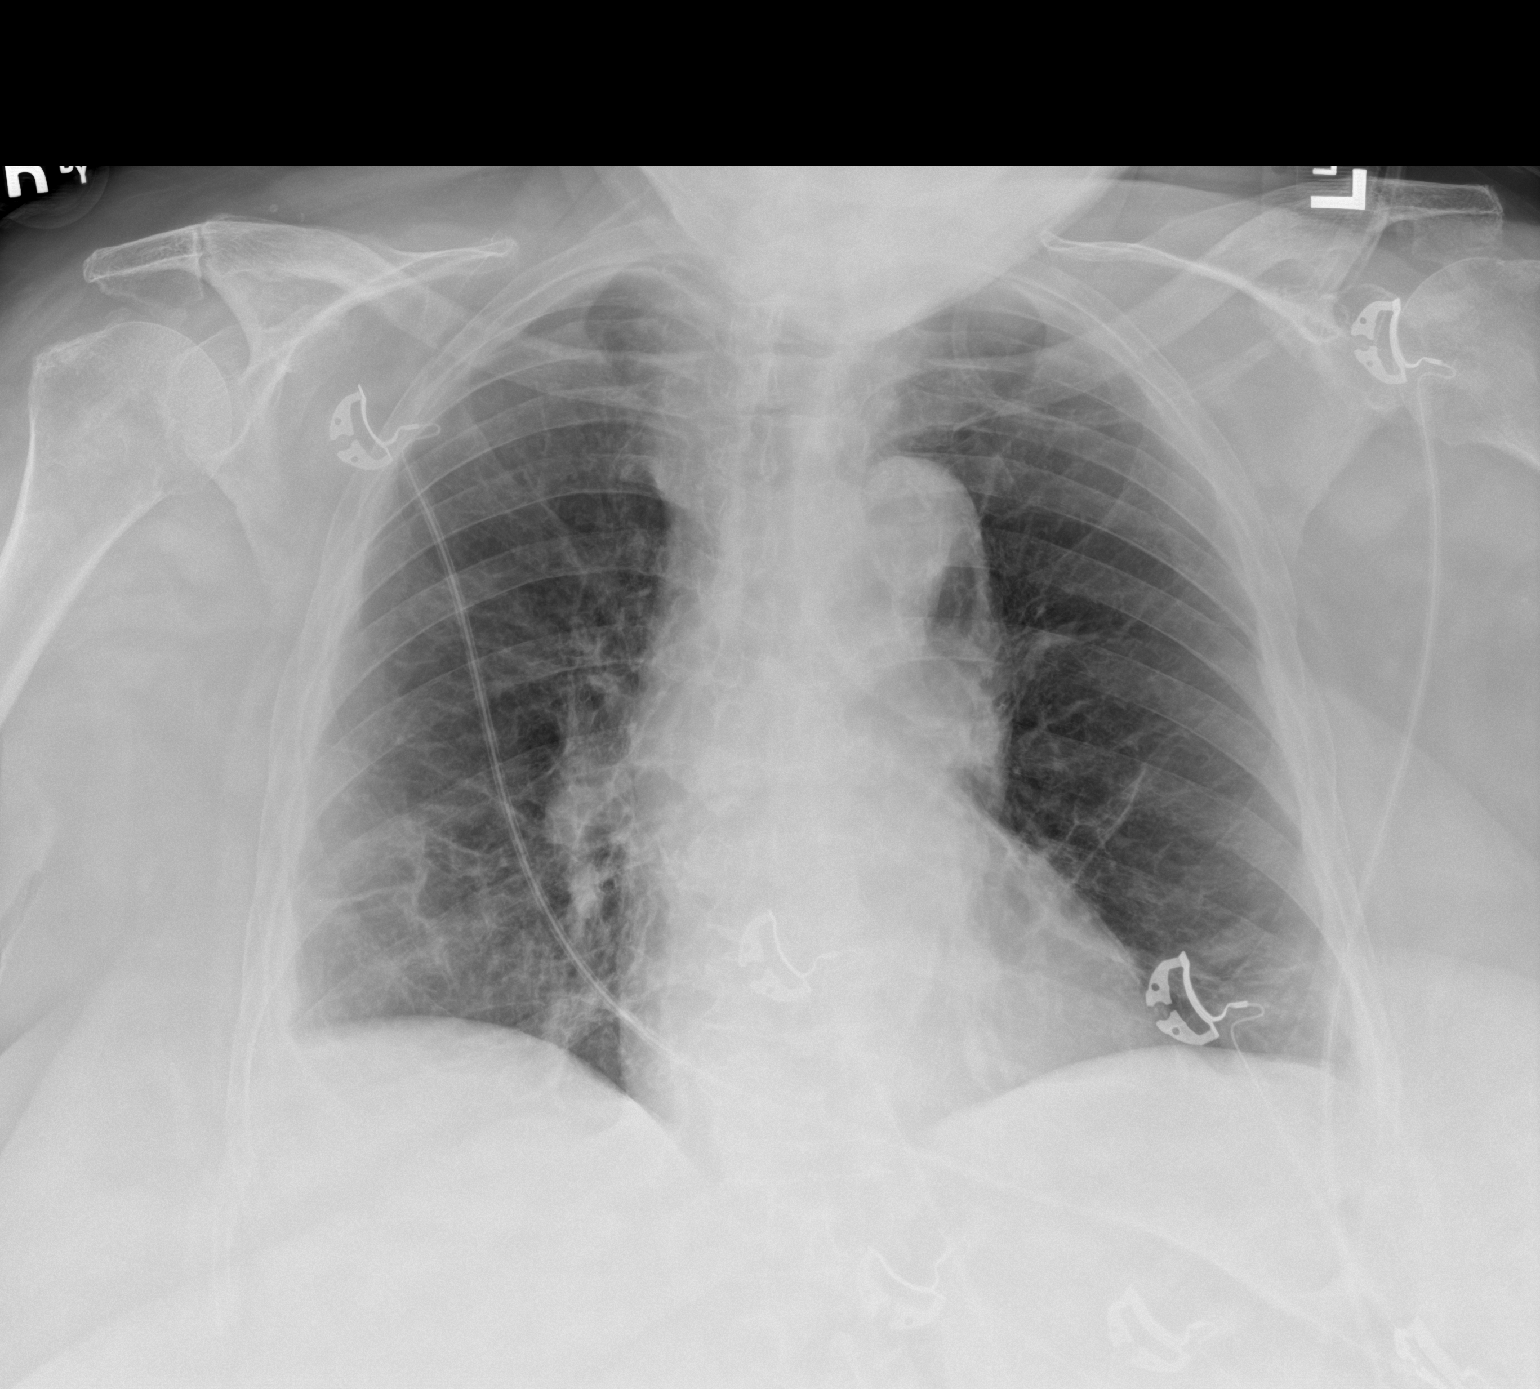

[1 of 1 positions shown; findings below may reference images not displayed]

FINDINGS: Normal cardiac silhouette. There is chronic interstitial thickening
and atelectasis in the RIGHT lower lobe seen on comparison
radiograph and CT. No acute focal airspace disease identified. No
pneumothorax. No acute osseous abnormality.
IMPRESSION: 1. No acute cardiopulmonary findings.
2. Chronic pleural thickening and atelectasis at the RIGHT lung
base.

## 2021-09-12 DIAGNOSIS — M6281 Muscle weakness (generalized): Secondary | ICD-10-CM | POA: Diagnosis not present

## 2021-09-12 DIAGNOSIS — N3090 Cystitis, unspecified without hematuria: Secondary | ICD-10-CM | POA: Diagnosis not present

## 2021-09-12 DIAGNOSIS — J029 Acute pharyngitis, unspecified: Secondary | ICD-10-CM | POA: Diagnosis not present

## 2021-09-12 DIAGNOSIS — R4182 Altered mental status, unspecified: Secondary | ICD-10-CM | POA: Diagnosis not present

## 2021-09-20 DIAGNOSIS — I739 Peripheral vascular disease, unspecified: Secondary | ICD-10-CM | POA: Diagnosis not present

## 2021-09-20 DIAGNOSIS — B351 Tinea unguium: Secondary | ICD-10-CM | POA: Diagnosis not present

## 2021-09-20 DIAGNOSIS — M79674 Pain in right toe(s): Secondary | ICD-10-CM | POA: Diagnosis not present

## 2021-09-20 DIAGNOSIS — L603 Nail dystrophy: Secondary | ICD-10-CM | POA: Diagnosis not present

## 2021-10-10 DIAGNOSIS — G40909 Epilepsy, unspecified, not intractable, without status epilepticus: Secondary | ICD-10-CM | POA: Diagnosis not present

## 2021-10-12 DIAGNOSIS — R569 Unspecified convulsions: Secondary | ICD-10-CM | POA: Diagnosis not present

## 2021-10-12 DIAGNOSIS — R1312 Dysphagia, oropharyngeal phase: Secondary | ICD-10-CM | POA: Diagnosis not present

## 2021-10-14 DIAGNOSIS — R1312 Dysphagia, oropharyngeal phase: Secondary | ICD-10-CM | POA: Diagnosis not present

## 2021-10-14 DIAGNOSIS — R569 Unspecified convulsions: Secondary | ICD-10-CM | POA: Diagnosis not present

## 2021-10-19 DIAGNOSIS — R569 Unspecified convulsions: Secondary | ICD-10-CM | POA: Diagnosis not present

## 2021-10-19 DIAGNOSIS — M21949 Unspecified acquired deformity of hand, unspecified hand: Secondary | ICD-10-CM | POA: Diagnosis not present

## 2021-10-19 DIAGNOSIS — M6281 Muscle weakness (generalized): Secondary | ICD-10-CM | POA: Diagnosis not present

## 2021-10-19 DIAGNOSIS — R1312 Dysphagia, oropharyngeal phase: Secondary | ICD-10-CM | POA: Diagnosis not present

## 2021-10-19 DIAGNOSIS — M1990 Unspecified osteoarthritis, unspecified site: Secondary | ICD-10-CM | POA: Diagnosis not present

## 2021-10-20 DIAGNOSIS — Z79899 Other long term (current) drug therapy: Secondary | ICD-10-CM | POA: Diagnosis not present

## 2021-10-20 DIAGNOSIS — R569 Unspecified convulsions: Secondary | ICD-10-CM | POA: Diagnosis not present

## 2021-10-20 DIAGNOSIS — M79641 Pain in right hand: Secondary | ICD-10-CM | POA: Diagnosis not present

## 2021-10-20 DIAGNOSIS — E7800 Pure hypercholesterolemia, unspecified: Secondary | ICD-10-CM | POA: Diagnosis not present

## 2021-10-20 DIAGNOSIS — R1312 Dysphagia, oropharyngeal phase: Secondary | ICD-10-CM | POA: Diagnosis not present

## 2021-10-20 DIAGNOSIS — M79642 Pain in left hand: Secondary | ICD-10-CM | POA: Diagnosis not present

## 2021-10-21 DIAGNOSIS — R569 Unspecified convulsions: Secondary | ICD-10-CM | POA: Diagnosis not present

## 2021-10-21 DIAGNOSIS — R1312 Dysphagia, oropharyngeal phase: Secondary | ICD-10-CM | POA: Diagnosis not present

## 2021-10-22 DIAGNOSIS — R1312 Dysphagia, oropharyngeal phase: Secondary | ICD-10-CM | POA: Diagnosis not present

## 2021-10-22 DIAGNOSIS — R569 Unspecified convulsions: Secondary | ICD-10-CM | POA: Diagnosis not present

## 2021-10-24 ENCOUNTER — Telehealth: Payer: Self-pay | Admitting: Neurology

## 2021-10-24 DIAGNOSIS — R1312 Dysphagia, oropharyngeal phase: Secondary | ICD-10-CM | POA: Diagnosis not present

## 2021-10-24 DIAGNOSIS — R569 Unspecified convulsions: Secondary | ICD-10-CM | POA: Diagnosis not present

## 2021-10-24 NOTE — Telephone Encounter (Signed)
Katherine [name] is calling. Stated she takes care of Pt at [facility]. Abigail Butts said she would like to talk to Dr. April Manson about Pt medication.

## 2021-10-25 ENCOUNTER — Telehealth: Payer: Self-pay | Admitting: Neurology

## 2021-10-25 DIAGNOSIS — R768 Other specified abnormal immunological findings in serum: Secondary | ICD-10-CM | POA: Diagnosis not present

## 2021-10-25 DIAGNOSIS — R1312 Dysphagia, oropharyngeal phase: Secondary | ICD-10-CM | POA: Diagnosis not present

## 2021-10-25 DIAGNOSIS — M79601 Pain in right arm: Secondary | ICD-10-CM | POA: Diagnosis not present

## 2021-10-25 DIAGNOSIS — R569 Unspecified convulsions: Secondary | ICD-10-CM | POA: Diagnosis not present

## 2021-10-25 DIAGNOSIS — M6281 Muscle weakness (generalized): Secondary | ICD-10-CM | POA: Diagnosis not present

## 2021-10-25 NOTE — Telephone Encounter (Signed)
Hi Sharitta,   Do we have a call back number for Jackey Loge?   Thank you  [facility]

## 2021-10-25 NOTE — Telephone Encounter (Signed)
Hi Dr. April Manson,  Jackey Loge  [phone]   Sorry!

## 2021-10-26 ENCOUNTER — Other Ambulatory Visit: Payer: Self-pay | Admitting: Neurology

## 2021-10-26 ENCOUNTER — Encounter: Payer: Self-pay | Admitting: Neurology

## 2021-10-26 DIAGNOSIS — L299 Pruritus, unspecified: Secondary | ICD-10-CM | POA: Diagnosis not present

## 2021-10-26 DIAGNOSIS — R569 Unspecified convulsions: Secondary | ICD-10-CM | POA: Diagnosis not present

## 2021-10-26 DIAGNOSIS — M6281 Muscle weakness (generalized): Secondary | ICD-10-CM | POA: Diagnosis not present

## 2021-10-26 MED ORDER — LEVETIRACETAM 500 MG PO TABS: 500.0000 mg | ORAL_TABLET | Freq: Two times a day (BID) | ORAL | Status: DC

## 2021-10-26 MED ORDER — LEVETIRACETAM 500 MG PO TABS: 500.0000 mg | ORAL_TABLET | Freq: Two times a day (BID) | ORAL | 11 refills | Status: DC

## 2021-10-26 NOTE — Progress Notes (Signed)
Spoke with [name], patient Keppra elevated > 65. She is not having any symptoms of toxicity, but will decrease it to 500 BID. New prescription sent to the pharmacy.   Dr. April Manson

## 2021-10-26 NOTE — Telephone Encounter (Signed)
Spoke with Abigail Butts, patient Keppra level is still elevated> 65. We will decrease the dose to 500 mg BID. New rx sent to the pharmacy. She will fax [place] a copy of the [place] level.

## 2021-10-27 DIAGNOSIS — R1310 Dysphagia, unspecified: Secondary | ICD-10-CM | POA: Diagnosis not present

## 2021-10-27 DIAGNOSIS — J441 Chronic obstructive pulmonary disease with (acute) exacerbation: Secondary | ICD-10-CM | POA: Diagnosis not present

## 2021-10-27 DIAGNOSIS — J029 Acute pharyngitis, unspecified: Secondary | ICD-10-CM | POA: Diagnosis not present

## 2021-10-27 DIAGNOSIS — E7800 Pure hypercholesterolemia, unspecified: Secondary | ICD-10-CM | POA: Diagnosis not present

## 2021-10-28 DIAGNOSIS — R700 Elevated erythrocyte sedimentation rate: Secondary | ICD-10-CM | POA: Diagnosis not present

## 2021-10-29 DIAGNOSIS — R569 Unspecified convulsions: Secondary | ICD-10-CM | POA: Diagnosis not present

## 2021-10-31 ENCOUNTER — Encounter: Payer: Self-pay | Admitting: Nurse Practitioner

## 2021-10-31 ENCOUNTER — Non-Acute Institutional Stay: Payer: Medicare HMO | Admitting: Nurse Practitioner

## 2021-10-31 VITALS — BP 145/90 | HR 57 | Temp 97.5°F | Resp 18 | Wt 229.5 lb

## 2021-10-31 DIAGNOSIS — R0602 Shortness of breath: Secondary | ICD-10-CM

## 2021-10-31 DIAGNOSIS — I509 Heart failure, unspecified: Secondary | ICD-10-CM | POA: Diagnosis not present

## 2021-10-31 DIAGNOSIS — Z515 Encounter for palliative care: Secondary | ICD-10-CM

## 2021-10-31 DIAGNOSIS — R5381 Other malaise: Secondary | ICD-10-CM

## 2021-10-31 NOTE — Progress Notes (Signed)
[facility] Consult Note Telephone: [phone]  Fax: [phone]    Date of encounter: 10/31/21 1:19 PM PATIENT NAME: Katherine [name] [address] [place] [address]   [phone] (home)  DOB: Sep 21, 1940 MRN: [id number] PRIMARY CARE PROVIDER:    Care, Katherine [name] [address] [phone]  RESPONSIBLE PARTY:    Contact Information     Name Relation Home Work [place] Spouse [phone]  [phone]   Katherine [name] Daughter [phone]  [phone]          I met face to face with patient in facility. Palliative Care was asked to follow this patient by consultation request of  Katherine [name] to address advance care planning and complex medical decision making. This is a follow up visit.                                  ASSESSMENT AND PLAN / RECOMMENDATIONS:  Symptom Management/Plan: 1. ACP: remains a full code; most form in ACP   2. Edema stable; secondary to CHF, monitor weights, elevate; discussed nutrition with Katherine [name]   3. Shortness of breath secondary to CHF, intermit symptoms;  continue diuresing, inhalation therapy; Continuous O2,  Continue daily weights;    10/23/2020 weight 249.1 lbs 12/24/2020 weight 245.1 lbs 01/16/2021 weight 239.0 lbs 04/30/2021 weight 241.2 lbs 05/18/2021 weight 240 lbs 10/21/2021 weight 229 lbs   4. Debility; discussed with Katherine [name] about getting oob which she declines. Revisited with Katherine [name] getting out of bed, explained non-compliance, if she declines to staff when they try to get her oob.    5. Palliative care encounter; Palliative medicine team will continue to support patient, patient's family, and medical team. Visit consisted of counseling and education dealing with the complex and emotionally intense issues of symptom management and palliative care in the setting of serious and potentially  life-threatening illness   Follow up Palliative Care Visit: Palliative care will continue to follow for complex medical decision making, advance care planning, and clarification of goals. Return 4 weeks or prn.   I spent 45 minutes providing this consultation starting at 12L00 pm. More than 50% of the time in this consultation was spent in counseling and care coordination. PPS: 30%   Chief Complaint: Follow up palliative consult for complex medical decision making   HISTORY OF PRESENT ILLNESS:  Katherine [name] is a 82 y.o. 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 Katherine [name] resides at [name]. Katherine [name] remains bed bound, require staff to assist her with turning, positioning, bathing, dressing. Katherine [name] does feed [name] after tray setup. Katherine [name] has been feeding [name] per staff. Staff endorses no other changes or problems. I visited and observed Katherine [name], we talked about purpose of pc visit, how she has been feeling, ros, functional limitations. We talked about residing at facility with some limitations cognitively. Most pc visit supportive. I called Katherine [name] Husband, updated  given clinically and pc visit, reviewed medical goals, poc, f/u pc visit. He was concerned about HOH, relayed to primary. Therapeutic listening, emotional support provided. Updated. Staff.    .  History obtained from  review of EMR, discussion with primary team, and interview with family, facility staff/caregiver and/or Katherine [name].  I reviewed available labs, medications, imaging, studies and related documents from the EMR.  Records reviewed and summarized above.    ROS 10 point system reviewed with staff as Katherine [name] has cognitive impairment with Katherine [name] being sleepy today   Physical Exam: Constitutional: NAD General: obese, debilitated, chronically ill female EYES:  lids intact ENMT: oral mucous membranes moist CV: S1S2, RRR Pulmonary: decrease bases, no increased work of breathing, no cough Abdomen: normo-active BS + 4 quadrants, soft and non tender MSK: bed-bound; non-ambulatory Skin: warm and dry Neuro:  + generalized weakness,  + cognitive impairment Psych: flat affect, A and Oriented to self  Thank you for the opportunity to participate in the care of Katherine [name].  The palliative care team will continue to follow. Please call our office at [phone] if we can be of additional assistance.   Katherine Moeckel Ihor Gully, NP

## 2021-11-01 DIAGNOSIS — J029 Acute pharyngitis, unspecified: Secondary | ICD-10-CM | POA: Diagnosis not present

## 2021-11-07 DIAGNOSIS — K219 Gastro-esophageal reflux disease without esophagitis: Secondary | ICD-10-CM | POA: Diagnosis not present

## 2021-11-07 DIAGNOSIS — R0982 Postnasal drip: Secondary | ICD-10-CM | POA: Diagnosis not present

## 2021-11-08 DIAGNOSIS — E1122 Type 2 diabetes mellitus with diabetic chronic kidney disease: Secondary | ICD-10-CM | POA: Diagnosis not present

## 2021-11-08 DIAGNOSIS — S80811D Abrasion, right lower leg, subsequent encounter: Secondary | ICD-10-CM | POA: Diagnosis not present

## 2021-11-08 DIAGNOSIS — S80811A Abrasion, right lower leg, initial encounter: Secondary | ICD-10-CM | POA: Diagnosis not present

## 2021-11-08 DIAGNOSIS — N184 Chronic kidney disease, stage 4 (severe): Secondary | ICD-10-CM | POA: Diagnosis not present

## 2021-11-08 DIAGNOSIS — J029 Acute pharyngitis, unspecified: Secondary | ICD-10-CM | POA: Diagnosis not present

## 2021-11-11 DIAGNOSIS — R0602 Shortness of breath: Secondary | ICD-10-CM | POA: Diagnosis not present

## 2021-11-11 DIAGNOSIS — M79601 Pain in right arm: Secondary | ICD-10-CM | POA: Diagnosis not present

## 2021-11-14 DIAGNOSIS — I517 Cardiomegaly: Secondary | ICD-10-CM | POA: Diagnosis not present

## 2021-11-14 DIAGNOSIS — M79601 Pain in right arm: Secondary | ICD-10-CM | POA: Diagnosis not present

## 2021-11-21 DIAGNOSIS — M79631 Pain in right forearm: Secondary | ICD-10-CM | POA: Diagnosis not present

## 2021-11-21 DIAGNOSIS — M79601 Pain in right arm: Secondary | ICD-10-CM | POA: Diagnosis not present

## 2021-11-22 DIAGNOSIS — R52 Pain, unspecified: Secondary | ICD-10-CM | POA: Diagnosis not present

## 2021-11-23 DIAGNOSIS — R52 Pain, unspecified: Secondary | ICD-10-CM | POA: Diagnosis not present

## 2021-11-25 DIAGNOSIS — L299 Pruritus, unspecified: Secondary | ICD-10-CM | POA: Diagnosis not present

## 2021-11-28 DIAGNOSIS — R0989 Other specified symptoms and signs involving the circulatory and respiratory systems: Secondary | ICD-10-CM | POA: Diagnosis not present

## 2021-11-28 DIAGNOSIS — R0981 Nasal congestion: Secondary | ICD-10-CM | POA: Diagnosis not present

## 2021-11-30 DIAGNOSIS — Z741 Need for assistance with personal care: Secondary | ICD-10-CM | POA: Diagnosis not present

## 2021-11-30 DIAGNOSIS — R1312 Dysphagia, oropharyngeal phase: Secondary | ICD-10-CM | POA: Diagnosis not present

## 2021-11-30 DIAGNOSIS — R41841 Cognitive communication deficit: Secondary | ICD-10-CM | POA: Diagnosis not present

## 2021-11-30 DIAGNOSIS — R1311 Dysphagia, oral phase: Secondary | ICD-10-CM | POA: Diagnosis not present

## 2021-11-30 DIAGNOSIS — R569 Unspecified convulsions: Secondary | ICD-10-CM | POA: Diagnosis not present

## 2021-11-30 DIAGNOSIS — Z23 Encounter for immunization: Secondary | ICD-10-CM | POA: Diagnosis not present

## 2021-11-30 DIAGNOSIS — M6281 Muscle weakness (generalized): Secondary | ICD-10-CM | POA: Diagnosis not present

## 2021-12-02 DIAGNOSIS — R058 Other specified cough: Secondary | ICD-10-CM | POA: Diagnosis not present

## 2021-12-02 DIAGNOSIS — J441 Chronic obstructive pulmonary disease with (acute) exacerbation: Secondary | ICD-10-CM | POA: Diagnosis not present

## 2021-12-02 DIAGNOSIS — R0602 Shortness of breath: Secondary | ICD-10-CM | POA: Diagnosis not present

## 2021-12-06 DIAGNOSIS — Z79899 Other long term (current) drug therapy: Secondary | ICD-10-CM | POA: Diagnosis not present

## 2021-12-06 DIAGNOSIS — M0579 Rheumatoid arthritis with rheumatoid factor of multiple sites without organ or systems involvement: Secondary | ICD-10-CM | POA: Diagnosis not present

## 2021-12-06 DIAGNOSIS — H9193 Unspecified hearing loss, bilateral: Secondary | ICD-10-CM | POA: Insufficient documentation

## 2021-12-06 DIAGNOSIS — Z1159 Encounter for screening for other viral diseases: Secondary | ICD-10-CM | POA: Diagnosis not present

## 2021-12-06 DIAGNOSIS — H547 Unspecified visual loss: Secondary | ICD-10-CM | POA: Diagnosis not present

## 2021-12-06 DIAGNOSIS — R600 Localized edema: Secondary | ICD-10-CM | POA: Diagnosis not present

## 2021-12-06 DIAGNOSIS — J441 Chronic obstructive pulmonary disease with (acute) exacerbation: Secondary | ICD-10-CM | POA: Diagnosis not present

## 2021-12-13 DIAGNOSIS — M79601 Pain in right arm: Secondary | ICD-10-CM | POA: Diagnosis not present

## 2021-12-16 ENCOUNTER — Non-Acute Institutional Stay: Payer: Medicare HMO | Admitting: Nurse Practitioner

## 2021-12-16 ENCOUNTER — Encounter: Payer: Self-pay | Admitting: Nurse Practitioner

## 2021-12-16 VITALS — BP 154/80 | HR 71 | Temp 97.8°F | Resp 18 | Wt 233.5 lb

## 2021-12-16 DIAGNOSIS — I509 Heart failure, unspecified: Secondary | ICD-10-CM

## 2021-12-16 DIAGNOSIS — Z515 Encounter for palliative care: Secondary | ICD-10-CM

## 2021-12-16 DIAGNOSIS — R5381 Other malaise: Secondary | ICD-10-CM

## 2021-12-16 DIAGNOSIS — R0602 Shortness of breath: Secondary | ICD-10-CM | POA: Diagnosis not present

## 2021-12-16 NOTE — Progress Notes (Signed)
[facility] Consult Note Telephone: [phone]  Fax: [phone]    Date of encounter: 12/16/21 11:31 AM PATIENT NAME: Katherine [name] [address] [address]   [phone] (home)  DOB: January 27, 1941 MRN: [id number] PRIMARY CARE PROVIDER:    Care, [place]  [address] [phone]  RESPONSIBLE PARTY:    Contact Information     Name Relation Home Work Katherine [name] Spouse [phone]  [phone]   Katherine [name] Daughter [phone]  [phone]      I met face to face with patient in facility. Palliative Care was asked to follow this patient by consultation request of  Care, Elizabeth to address advance care planning and complex medical decision making. This is a follow up visit.                                  ASSESSMENT AND PLAN / RECOMMENDATIONS:  Symptom Management/Plan: 1. ACP: remains a full code; most form in ACP   2. Edema stable; secondary to CHF, monitor weights, elevate; discussed nutrition with Katherine [name]   3. Shortness of breath secondary to CHF, intermit symptoms;  continue diuresing, inhalation therapy; Continuous O2,  Continue daily weights;    10/23/2020 weight 249.1 lbs 12/24/2020 weight 245.1 lbs 01/16/2021 weight 239.0 lbs 04/30/2021 weight 241.2 lbs 05/18/2021 weight 240 lbs 10/21/2021 weight 229 lbs 11/16/2021 weight 233.5 lbs  4.5 lbs weight gain 4. Debility; discussed with Katherine [name] about getting oob which she declines. Revisited with Katherine [name] updated Katherine [name] getting out of bed, explained non-compliance, if she declines to staff when they try to get her oob.    5. Palliative care encounter; Palliative medicine team will continue to support patient, patient's family, and medical team. Visit consisted of counseling and education dealing with the complex and emotionally intense issues of symptom management and palliative care in the  setting of serious and potentially life-threatening illness   Follow up Palliative Care Visit: Palliative care will continue to follow for complex medical decision making, advance care planning, and clarification of goals. Return 4 weeks or prn.   I spent 35 minutes providing this consultation starting at 11:10 am. More than 50% of the time in this consultation was spent in counseling and care coordination. PPS: 30%   Chief Complaint: Follow up palliative consult for complex medical decision making   HISTORY OF PRESENT ILLNESS:  Katherine [name] is a 81 y.o. 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 Katherine [name] resides at [place] [facility]. Katherine [name] remains bed bound, require staff to assist her with turning, positioning, bathing, dressing. Katherine [name] does feed herself after tray setup. Katherine [name] has been feeding herself per staff. Staff endorses no other changes or problems. I visited and observed Katherine [name] she was lying in bed, appears comfortable. Katherine [name] endorses she has been having a productive cough, clear sputum. Katherine [name] endorses she is having a good day, her breathing has been  better, denies sob. We talked about purpose of pc visit, though limited with Katherine [name] mild cognitive impairment. We talked about ros, appetite, weight gain, edema. We talked about food choices, snacks and salt. We talked about  mobility, oob which she declined. We talked about her daily routine what she does in bed throughout the day. We talked about residing facility, though limited. Most pc visit supportive. We talked about Katherine [name] husband visiting. Medical goals, medications reviewed. No new changes to poc. Updated staff, attempted to contact husband, unable to reach.   Therapeutic listening, emotional support provided. Updated. Staff.    .  History obtained from review of EMR, discussion with primary team, and interview with family, facility staff/caregiver and/or Katherine. Ronnald [name].  I reviewed available labs, medications, imaging, studies and related documents from the EMR.  Records reviewed and summarized above.    ROS 10 point system reviewed with staff as Katherine [name] has cognitive impairment with Katherine [name] being sleepy today   Physical Exam: Constitutional: NAD General: obese, debilitated, chronically ill female EYES:  lids intact ENMT: oral mucous membranes moist CV: S1S2, RRR Pulmonary: decrease bases, no increased work of breathing, no cough MSK: bed-bound; non-ambulatory Skin: warm and dry Neuro:  + generalized weakness,  + cognitive impairment Psych: flat affect, A and Oriented to self Thank you for the opportunity to participate in the care of Katherine [name].  The palliative care team will continue to follow. Please call our office at [phone] if we can be of additional assistance.   Katherine [name] Katherine Gully, NP

## 2021-12-17 ENCOUNTER — Encounter (HOSPITAL_COMMUNITY): Payer: Self-pay

## 2021-12-17 ENCOUNTER — Inpatient Hospital Stay (HOSPITAL_COMMUNITY)
Admission: AD | Admit: 2021-12-17 | Discharge: 2021-12-26 | DRG: 100 | Disposition: A | Discharge status: Skilled Nursing Facility | Payer: Medicare HMO | Source: Other Acute Inpatient Hospital | Attending: Internal Medicine | Admitting: Internal Medicine

## 2021-12-17 ENCOUNTER — Other Ambulatory Visit: Payer: Self-pay

## 2021-12-17 ENCOUNTER — Emergency Department: Payer: Medicare HMO

## 2021-12-17 ENCOUNTER — Inpatient Hospital Stay: Admit: 2021-12-17 | Payer: Medicare HMO

## 2021-12-17 ENCOUNTER — Emergency Department
Admission: EM | Admit: 2021-12-17 | Discharge: 2021-12-17 | Disposition: A | Discharge status: Other Acute Inpatient Hospital | Payer: Medicare HMO | Attending: Emergency Medicine | Admitting: Emergency Medicine

## 2021-12-17 ENCOUNTER — Inpatient Hospital Stay (HOSPITAL_COMMUNITY): Payer: Medicare HMO

## 2021-12-17 DIAGNOSIS — D649 Anemia, unspecified: Secondary | ICD-10-CM | POA: Diagnosis not present

## 2021-12-17 DIAGNOSIS — Z87891 Personal history of nicotine dependence: Secondary | ICD-10-CM | POA: Diagnosis not present

## 2021-12-17 DIAGNOSIS — G4733 Obstructive sleep apnea (adult) (pediatric): Secondary | ICD-10-CM | POA: Diagnosis present

## 2021-12-17 DIAGNOSIS — G40901 Epilepsy, unspecified, not intractable, with status epilepticus: Principal | ICD-10-CM

## 2021-12-17 DIAGNOSIS — Z7401 Bed confinement status: Secondary | ICD-10-CM | POA: Diagnosis not present

## 2021-12-17 DIAGNOSIS — I130 Hypertensive heart and chronic kidney disease with heart failure and stage 1 through stage 4 chronic kidney disease, or unspecified chronic kidney disease: Secondary | ICD-10-CM | POA: Diagnosis present

## 2021-12-17 DIAGNOSIS — Z6841 Body Mass Index (BMI) 40.0 and over, adult: Secondary | ICD-10-CM | POA: Diagnosis not present

## 2021-12-17 DIAGNOSIS — E870 Hyperosmolality and hypernatremia: Secondary | ICD-10-CM | POA: Diagnosis present

## 2021-12-17 DIAGNOSIS — N189 Chronic kidney disease, unspecified: Secondary | ICD-10-CM | POA: Insufficient documentation

## 2021-12-17 DIAGNOSIS — J189 Pneumonia, unspecified organism: Secondary | ICD-10-CM | POA: Diagnosis not present

## 2021-12-17 DIAGNOSIS — Z741 Need for assistance with personal care: Secondary | ICD-10-CM | POA: Diagnosis not present

## 2021-12-17 DIAGNOSIS — I509 Heart failure, unspecified: Secondary | ICD-10-CM | POA: Insufficient documentation

## 2021-12-17 DIAGNOSIS — R404 Transient alteration of awareness: Secondary | ICD-10-CM | POA: Diagnosis not present

## 2021-12-17 DIAGNOSIS — Z8616 Personal history of COVID-19: Secondary | ICD-10-CM | POA: Diagnosis not present

## 2021-12-17 DIAGNOSIS — E7220 Disorder of urea cycle metabolism, unspecified: Secondary | ICD-10-CM | POA: Diagnosis not present

## 2021-12-17 DIAGNOSIS — F32A Depression, unspecified: Secondary | ICD-10-CM | POA: Diagnosis present

## 2021-12-17 DIAGNOSIS — R627 Adult failure to thrive: Secondary | ICD-10-CM | POA: Diagnosis not present

## 2021-12-17 DIAGNOSIS — I5032 Chronic diastolic (congestive) heart failure: Secondary | ICD-10-CM | POA: Diagnosis not present

## 2021-12-17 DIAGNOSIS — N3000 Acute cystitis without hematuria: Secondary | ICD-10-CM

## 2021-12-17 DIAGNOSIS — R569 Unspecified convulsions: Secondary | ICD-10-CM | POA: Diagnosis not present

## 2021-12-17 DIAGNOSIS — R55 Syncope and collapse: Secondary | ICD-10-CM | POA: Diagnosis not present

## 2021-12-17 DIAGNOSIS — E669 Obesity, unspecified: Secondary | ICD-10-CM | POA: Diagnosis present

## 2021-12-17 DIAGNOSIS — F419 Anxiety disorder, unspecified: Secondary | ICD-10-CM | POA: Diagnosis present

## 2021-12-17 DIAGNOSIS — M069 Rheumatoid arthritis, unspecified: Secondary | ICD-10-CM | POA: Diagnosis present

## 2021-12-17 DIAGNOSIS — Z7951 Long term (current) use of inhaled steroids: Secondary | ICD-10-CM

## 2021-12-17 DIAGNOSIS — J449 Chronic obstructive pulmonary disease, unspecified: Secondary | ICD-10-CM | POA: Diagnosis present

## 2021-12-17 DIAGNOSIS — R0602 Shortness of breath: Secondary | ICD-10-CM | POA: Diagnosis not present

## 2021-12-17 DIAGNOSIS — J9622 Acute and chronic respiratory failure with hypercapnia: Secondary | ICD-10-CM | POA: Diagnosis not present

## 2021-12-17 DIAGNOSIS — Z4682 Encounter for fitting and adjustment of non-vascular catheter: Secondary | ICD-10-CM | POA: Diagnosis not present

## 2021-12-17 DIAGNOSIS — N184 Chronic kidney disease, stage 4 (severe): Secondary | ICD-10-CM | POA: Diagnosis present

## 2021-12-17 DIAGNOSIS — R079 Chest pain, unspecified: Secondary | ICD-10-CM | POA: Diagnosis not present

## 2021-12-17 DIAGNOSIS — J9811 Atelectasis: Secondary | ICD-10-CM | POA: Diagnosis not present

## 2021-12-17 DIAGNOSIS — J9601 Acute respiratory failure with hypoxia: Secondary | ICD-10-CM | POA: Insufficient documentation

## 2021-12-17 DIAGNOSIS — R6889 Other general symptoms and signs: Secondary | ICD-10-CM | POA: Diagnosis not present

## 2021-12-17 DIAGNOSIS — Z811 Family history of alcohol abuse and dependence: Secondary | ICD-10-CM

## 2021-12-17 DIAGNOSIS — Z66 Do not resuscitate: Secondary | ICD-10-CM | POA: Diagnosis not present

## 2021-12-17 DIAGNOSIS — Z515 Encounter for palliative care: Secondary | ICD-10-CM | POA: Diagnosis not present

## 2021-12-17 DIAGNOSIS — Z888 Allergy status to other drugs, medicaments and biological substances status: Secondary | ICD-10-CM

## 2021-12-17 DIAGNOSIS — G319 Degenerative disease of nervous system, unspecified: Secondary | ICD-10-CM | POA: Diagnosis not present

## 2021-12-17 DIAGNOSIS — R001 Bradycardia, unspecified: Secondary | ICD-10-CM | POA: Insufficient documentation

## 2021-12-17 DIAGNOSIS — Z79899 Other long term (current) drug therapy: Secondary | ICD-10-CM

## 2021-12-17 DIAGNOSIS — E875 Hyperkalemia: Secondary | ICD-10-CM | POA: Diagnosis present

## 2021-12-17 DIAGNOSIS — M1990 Unspecified osteoarthritis, unspecified site: Secondary | ICD-10-CM | POA: Diagnosis present

## 2021-12-17 DIAGNOSIS — R061 Stridor: Secondary | ICD-10-CM

## 2021-12-17 DIAGNOSIS — J441 Chronic obstructive pulmonary disease with (acute) exacerbation: Secondary | ICD-10-CM | POA: Diagnosis not present

## 2021-12-17 DIAGNOSIS — Z743 Need for continuous supervision: Secondary | ICD-10-CM | POA: Diagnosis not present

## 2021-12-17 DIAGNOSIS — E662 Morbid (severe) obesity with alveolar hypoventilation: Secondary | ICD-10-CM | POA: Diagnosis not present

## 2021-12-17 DIAGNOSIS — E1122 Type 2 diabetes mellitus with diabetic chronic kidney disease: Secondary | ICD-10-CM | POA: Diagnosis not present

## 2021-12-17 DIAGNOSIS — J9691 Respiratory failure, unspecified with hypoxia: Secondary | ICD-10-CM | POA: Diagnosis not present

## 2021-12-17 DIAGNOSIS — G40919 Epilepsy, unspecified, intractable, without status epilepticus: Secondary | ICD-10-CM

## 2021-12-17 DIAGNOSIS — N390 Urinary tract infection, site not specified: Secondary | ICD-10-CM | POA: Diagnosis present

## 2021-12-17 DIAGNOSIS — R410 Disorientation, unspecified: Secondary | ICD-10-CM | POA: Diagnosis not present

## 2021-12-17 DIAGNOSIS — D631 Anemia in chronic kidney disease: Secondary | ICD-10-CM | POA: Diagnosis present

## 2021-12-17 DIAGNOSIS — K219 Gastro-esophageal reflux disease without esophagitis: Secondary | ICD-10-CM | POA: Diagnosis present

## 2021-12-17 DIAGNOSIS — Z96651 Presence of right artificial knee joint: Secondary | ICD-10-CM | POA: Diagnosis present

## 2021-12-17 DIAGNOSIS — J90 Pleural effusion, not elsewhere classified: Secondary | ICD-10-CM | POA: Diagnosis not present

## 2021-12-17 DIAGNOSIS — G928 Other toxic encephalopathy: Secondary | ICD-10-CM | POA: Diagnosis present

## 2021-12-17 DIAGNOSIS — R4789 Other speech disturbances: Secondary | ICD-10-CM | POA: Diagnosis not present

## 2021-12-17 DIAGNOSIS — Z832 Family history of diseases of the blood and blood-forming organs and certain disorders involving the immune mechanism: Secondary | ICD-10-CM

## 2021-12-17 DIAGNOSIS — R41841 Cognitive communication deficit: Secondary | ICD-10-CM | POA: Diagnosis not present

## 2021-12-17 DIAGNOSIS — I469 Cardiac arrest, cause unspecified: Secondary | ICD-10-CM | POA: Diagnosis not present

## 2021-12-17 DIAGNOSIS — R0902 Hypoxemia: Secondary | ICD-10-CM | POA: Diagnosis not present

## 2021-12-17 DIAGNOSIS — Z91030 Bee allergy status: Secondary | ICD-10-CM

## 2021-12-17 DIAGNOSIS — G9340 Encephalopathy, unspecified: Secondary | ICD-10-CM

## 2021-12-17 DIAGNOSIS — Z8249 Family history of ischemic heart disease and other diseases of the circulatory system: Secondary | ICD-10-CM

## 2021-12-17 DIAGNOSIS — J9690 Respiratory failure, unspecified, unspecified whether with hypoxia or hypercapnia: Secondary | ICD-10-CM | POA: Diagnosis not present

## 2021-12-17 HISTORY — DX: Rheumatoid arthritis, unspecified: M06.9

## 2021-12-17 LAB — BLOOD GAS, ARTERIAL
Acid-Base Excess: 9.4 mmol/L — ABNORMAL HIGH (ref 0.0–2.0)
Bicarbonate: 35.2 mmol/L — ABNORMAL HIGH (ref 20.0–28.0)
Expiratory PAP: 8 cmH2O
FIO2: 60 %
Inspiratory PAP: 30 cmH2O
Mechanical Rate: 20
O2 Content: 60 L/min
O2 Saturation: 99.8 %
Patient temperature: 37
pCO2 arterial: 53 mmHg — ABNORMAL HIGH (ref 32–48)
pH, Arterial: 7.43 (ref 7.35–7.45)
pO2, Arterial: 192 mmHg — ABNORMAL HIGH (ref 83–108)

## 2021-12-17 LAB — URINALYSIS, ROUTINE W REFLEX MICROSCOPIC
Bilirubin Urine: NEGATIVE
Glucose, UA: NEGATIVE mg/dL
Hgb urine dipstick: NEGATIVE
Ketones, ur: NEGATIVE mg/dL
Nitrite: NEGATIVE
Protein, ur: NEGATIVE mg/dL
Specific Gravity, Urine: 1.01 (ref 1.005–1.030)
pH: 5 (ref 5.0–8.0)

## 2021-12-17 LAB — COMPREHENSIVE METABOLIC PANEL
ALT: 6 U/L (ref 0–44)
AST: 17 U/L (ref 15–41)
Albumin: 3.2 g/dL — ABNORMAL LOW (ref 3.5–5.0)
Alkaline Phosphatase: 90 U/L (ref 38–126)
Anion gap: 5 (ref 5–15)
BUN: 21 mg/dL (ref 8–23)
CO2: 36 mmol/L — ABNORMAL HIGH (ref 22–32)
Calcium: 8.7 mg/dL — ABNORMAL LOW (ref 8.9–10.3)
Chloride: 100 mmol/L (ref 98–111)
Creatinine, Ser: 1.77 mg/dL — ABNORMAL HIGH (ref 0.44–1.00)
GFR, Estimated: 29 mL/min — ABNORMAL LOW (ref >60)
Glucose, Bld: 121 mg/dL — ABNORMAL HIGH (ref 70–99)
Potassium: 5.7 mmol/L — ABNORMAL HIGH (ref 3.5–5.1)
Sodium: 141 mmol/L (ref 135–145)
Total Bilirubin: 1.2 mg/dL (ref 0.3–1.2)
Total Protein: 7.1 g/dL (ref 6.5–8.1)

## 2021-12-17 LAB — CBC WITH DIFFERENTIAL/PLATELET
Abs Immature Granulocytes: 0.03 10*3/uL (ref 0.00–0.07)
Basophils Absolute: 0 10*3/uL (ref 0.0–0.1)
Basophils Relative: 0 %
Eosinophils Absolute: 0.2 10*3/uL (ref 0.0–0.5)
Eosinophils Relative: 3 %
HCT: 35.7 % — ABNORMAL LOW (ref 36.0–46.0)
Hemoglobin: 11 g/dL — ABNORMAL LOW (ref 12.0–15.0)
Immature Granulocytes: 1 %
Lymphocytes Relative: 30 %
Lymphs Abs: 1.6 10*3/uL (ref 0.7–4.0)
MCH: 29.2 pg (ref 26.0–34.0)
MCHC: 30.8 g/dL (ref 30.0–36.0)
MCV: 94.7 fL (ref 80.0–100.0)
Monocytes Absolute: 0.5 10*3/uL (ref 0.1–1.0)
Monocytes Relative: 9 %
Neutro Abs: 3.1 10*3/uL (ref 1.7–7.7)
Neutrophils Relative %: 57 %
Platelets: 167 10*3/uL (ref 150–400)
RBC: 3.77 MIL/uL — ABNORMAL LOW (ref 3.87–5.11)
RDW: 13.1 % (ref 11.5–15.5)
WBC: 5.5 10*3/uL (ref 4.0–10.5)
nRBC: 0 % (ref 0.0–0.2)

## 2021-12-17 LAB — POCT I-STAT 7, (LYTES, BLD GAS, ICA,H+H)
Acid-Base Excess: 7 mmol/L — ABNORMAL HIGH (ref 0.0–2.0)
Bicarbonate: 33.4 mmol/L — ABNORMAL HIGH (ref 20.0–28.0)
Calcium, Ion: 1.2 mmol/L (ref 1.15–1.40)
HCT: 33 % — ABNORMAL LOW (ref 36.0–46.0)
Hemoglobin: 11.2 g/dL — ABNORMAL LOW (ref 12.0–15.0)
O2 Saturation: 99 %
Patient temperature: 98.5
Potassium: 4.4 mmol/L (ref 3.5–5.1)
Sodium: 140 mmol/L (ref 135–145)
TCO2: 35 mmol/L — ABNORMAL HIGH (ref 22–32)
pCO2 arterial: 55.8 mmHg — ABNORMAL HIGH (ref 32–48)
pH, Arterial: 7.385 (ref 7.35–7.45)
pO2, Arterial: 156 mmHg — ABNORMAL HIGH (ref 83–108)

## 2021-12-17 LAB — CBG MONITORING, ED: Glucose-Capillary: 102 mg/dL — ABNORMAL HIGH (ref 70–99)

## 2021-12-17 LAB — GLUCOSE, CAPILLARY: Glucose-Capillary: 114 mg/dL — ABNORMAL HIGH (ref 70–99)

## 2021-12-17 LAB — LACTIC ACID, PLASMA: Lactic Acid, Venous: 1.3 mmol/L (ref 0.5–1.9)

## 2021-12-17 MED ORDER — INSULIN ASPART 100 UNIT/ML IJ SOLN
0.0000 [IU] | INTRAMUSCULAR | Status: DC
  Administered 2021-12-19 – 2021-12-23 (×15): 1 [IU] via SUBCUTANEOUS

## 2021-12-17 MED ORDER — MIDAZOLAM BOLUS VIA INFUSION
0.0000 mg | INTRAVENOUS | Status: DC | PRN
  Administered 2021-12-19: 2 mg via INTRAVENOUS

## 2021-12-17 MED ORDER — FENTANYL CITRATE PF 50 MCG/ML IJ SOSY
25.0000 ug | PREFILLED_SYRINGE | INTRAMUSCULAR | Status: DC | PRN
  Administered 2021-12-18 (×2): 100 ug via INTRAVENOUS
  Administered 2021-12-18: 50 ug via INTRAVENOUS
  Administered 2021-12-18: 100 ug via INTRAVENOUS
  Administered 2021-12-19: 50 ug via INTRAVENOUS
  Administered 2021-12-19 (×2): 100 ug via INTRAVENOUS
  Administered 2021-12-20 (×3): 50 ug via INTRAVENOUS
  Administered 2021-12-20: 100 ug via INTRAVENOUS
  Filled 2021-12-17 (×2): qty 2
  Filled 2021-12-17 (×2): qty 1
  Filled 2021-12-17 (×3): qty 2
  Filled 2021-12-17: qty 1
  Filled 2021-12-17: qty 2
  Filled 2021-12-17: qty 1
  Filled 2021-12-17: qty 2

## 2021-12-17 MED ORDER — SERTRALINE HCL 50 MG PO TABS
50.0000 mg | ORAL_TABLET | Freq: Every day | ORAL | Status: DC
  Administered 2021-12-18 – 2021-12-22 (×4): 50 mg
  Filled 2021-12-17 (×4): qty 1

## 2021-12-17 MED ORDER — LACTATED RINGERS IV BOLUS
1000.0000 mL | Freq: Once | INTRAVENOUS | Status: AC
  Administered 2021-12-17: 1000 mL via INTRAVENOUS

## 2021-12-17 MED ORDER — ACETAMINOPHEN 325 MG PO TABS
650.0000 mg | ORAL_TABLET | ORAL | Status: DC | PRN
  Administered 2021-12-19 – 2021-12-22 (×3): 650 mg
  Filled 2021-12-17 (×3): qty 2

## 2021-12-17 MED ORDER — PROPOFOL 1000 MG/100ML IV EMUL
5.0000 ug/kg/min | INTRAVENOUS | Status: DC
  Administered 2021-12-17: 5 ug/kg/min via INTRAVENOUS

## 2021-12-17 MED ORDER — SODIUM CHLORIDE 0.9 % IV SOLN
2000.0000 mg | Freq: Once | INTRAVENOUS | Status: AC
  Administered 2021-12-17: 2000 mg via INTRAVENOUS
  Filled 2021-12-17: qty 20

## 2021-12-17 MED ORDER — BUDESONIDE 0.25 MG/2ML IN SUSP
0.2500 mg | Freq: Two times a day (BID) | RESPIRATORY_TRACT | Status: DC
  Administered 2021-12-17 – 2021-12-19 (×4): 0.25 mg via RESPIRATORY_TRACT
  Filled 2021-12-17 (×4): qty 2

## 2021-12-17 MED ORDER — ETOMIDATE 2 MG/ML IV SOLN
INTRAVENOUS | Status: AC | PRN
  Administered 2021-12-17: 30 mg via INTRAVENOUS

## 2021-12-17 MED ORDER — LORAZEPAM 2 MG/ML IJ SOLN
INTRAMUSCULAR | Status: AC
  Administered 2021-12-17: 2 mg via INTRAVENOUS
  Filled 2021-12-17: qty 1

## 2021-12-17 MED ORDER — MIDAZOLAM HCL 2 MG/2ML IJ SOLN: 4.0000 mg | Freq: Once | INTRAMUSCULAR | Status: DC

## 2021-12-17 MED ORDER — POLYETHYLENE GLYCOL 3350 17 G PO PACK
17.0000 g | PACK | Freq: Every day | ORAL | Status: DC
  Administered 2021-12-17: 17 g
  Filled 2021-12-17: qty 1

## 2021-12-17 MED ORDER — LEVETIRACETAM IN NACL 1000 MG/100ML IV SOLN
1000.0000 mg | Freq: Two times a day (BID) | INTRAVENOUS | Status: DC
  Administered 2021-12-17 – 2021-12-18 (×2): 1000 mg via INTRAVENOUS
  Filled 2021-12-17 (×2): qty 100

## 2021-12-17 MED ORDER — CHLORHEXIDINE GLUCONATE CLOTH 2 % EX PADS
6.0000 | MEDICATED_PAD | Freq: Every day | CUTANEOUS | Status: DC
  Administered 2021-12-17 – 2021-12-26 (×10): 6 via TOPICAL

## 2021-12-17 MED ORDER — VECURONIUM BROMIDE 10 MG IV SOLR
INTRAVENOUS | Status: AC | PRN
  Administered 2021-12-17: 12 mg via INTRAVENOUS

## 2021-12-17 MED ORDER — SODIUM ZIRCONIUM CYCLOSILICATE 10 G PO PACK
10.0000 g | PACK | Freq: Once | ORAL | Status: AC
  Administered 2021-12-17: 10 g
  Filled 2021-12-17: qty 1

## 2021-12-17 MED ORDER — FAMOTIDINE 20 MG PO TABS
20.0000 mg | ORAL_TABLET | Freq: Two times a day (BID) | ORAL | Status: DC
  Administered 2021-12-17 – 2021-12-18 (×2): 20 mg
  Filled 2021-12-17 (×2): qty 1

## 2021-12-17 MED ORDER — HYDROXYCHLOROQUINE SULFATE 200 MG PO TABS
200.0000 mg | ORAL_TABLET | Freq: Every day | ORAL | Status: DC
  Administered 2021-12-18 – 2021-12-22 (×4): 200 mg
  Filled 2021-12-17 (×5): qty 1

## 2021-12-17 MED ORDER — ARFORMOTEROL TARTRATE 15 MCG/2ML IN NEBU
15.0000 ug | INHALATION_SOLUTION | Freq: Two times a day (BID) | RESPIRATORY_TRACT | Status: DC
  Administered 2021-12-17 – 2021-12-26 (×16): 15 ug via RESPIRATORY_TRACT
  Filled 2021-12-17 (×17): qty 2

## 2021-12-17 MED ORDER — SODIUM CHLORIDE 0.9 % IV SOLN
100.0000 mg | Freq: Two times a day (BID) | INTRAVENOUS | Status: DC
  Administered 2021-12-17 – 2021-12-26 (×18): 100 mg via INTRAVENOUS
  Filled 2021-12-17 (×20): qty 10

## 2021-12-17 MED ORDER — FENTANYL CITRATE PF 50 MCG/ML IJ SOSY: 25.0000 ug | PREFILLED_SYRINGE | INTRAMUSCULAR | Status: DC | PRN

## 2021-12-17 MED ORDER — SODIUM CHLORIDE 0.9 % IV SOLN
200.0000 mg | Freq: Once | INTRAVENOUS | Status: AC
  Administered 2021-12-17: 200 mg via INTRAVENOUS
  Filled 2021-12-17: qty 20

## 2021-12-17 MED ORDER — REVEFENACIN 175 MCG/3ML IN SOLN
175.0000 ug | Freq: Every day | RESPIRATORY_TRACT | Status: DC
  Administered 2021-12-18 – 2021-12-26 (×9): 175 ug via RESPIRATORY_TRACT
  Filled 2021-12-17 (×9): qty 3

## 2021-12-17 MED ORDER — MIDAZOLAM-SODIUM CHLORIDE 100-0.9 MG/100ML-% IV SOLN
5.0000 mg/h | INTRAVENOUS | Status: DC
  Administered 2021-12-17: 5 mg/h via INTRAVENOUS
  Filled 2021-12-17: qty 100

## 2021-12-17 MED ORDER — SODIUM CHLORIDE 0.9 % IV SOLN
200.0000 mg | Freq: Two times a day (BID) | INTRAVENOUS | Status: DC
  Filled 2021-12-17: qty 20

## 2021-12-17 MED ORDER — SODIUM CHLORIDE 0.9 % IV BOLUS
500.0000 mL | Freq: Once | INTRAVENOUS | Status: AC
  Administered 2021-12-17: 500 mL via INTRAVENOUS

## 2021-12-17 MED ORDER — HEPARIN SODIUM (PORCINE) 5000 UNIT/ML IJ SOLN
5000.0000 [IU] | Freq: Three times a day (TID) | INTRAMUSCULAR | Status: DC
  Administered 2021-12-17 – 2021-12-26 (×22): 5000 [IU] via SUBCUTANEOUS
  Filled 2021-12-17 (×25): qty 1

## 2021-12-17 MED ORDER — LORAZEPAM 2 MG/ML IJ SOLN: 2.0000 mg | Freq: Once | INTRAMUSCULAR | Status: AC

## 2021-12-17 MED ORDER — MIDAZOLAM-SODIUM CHLORIDE 100-0.9 MG/100ML-% IV SOLN
0.0000 mg/h | INTRAVENOUS | Status: DC
  Administered 2021-12-17 – 2021-12-18 (×2): 5 mg/h via INTRAVENOUS
  Filled 2021-12-17: qty 100

## 2021-12-17 MED ORDER — SODIUM CHLORIDE 0.9 % IV SOLN
2.0000 g | INTRAVENOUS | Status: AC
  Administered 2021-12-17 – 2021-12-19 (×3): 2 g via INTRAVENOUS
  Filled 2021-12-17 (×3): qty 20

## 2021-12-17 MED ORDER — DOCUSATE SODIUM 50 MG/5ML PO LIQD: 100.0000 mg | Freq: Two times a day (BID) | ORAL | Status: DC

## 2021-12-17 MED ORDER — ALBUTEROL SULFATE (2.5 MG/3ML) 0.083% IN NEBU
2.5000 mg | INHALATION_SOLUTION | RESPIRATORY_TRACT | Status: DC | PRN
  Administered 2021-12-23: 2.5 mg via RESPIRATORY_TRACT
  Filled 2021-12-17: qty 3

## 2021-12-17 NOTE — ED Notes (Addendum)
Lab at bedside

## 2021-12-17 NOTE — Consult Note (Addendum)
Neurology Consultation Reason for Consult: Seizures Referring Physician: Lou Cal  CC: Seizures  History is obtained from: Referring physician  HPI: Katherine [name] is a 81 y.o. female who was transported by EMS.  She was seen to have seizure-like activity at home and EMS was called.  On arrival, EMS stated that she was awake and talking to them, but she had repeated seizure in route and got 5 mg of Versed.  She came to immediately after that, but then went unresponsive again and was not breathing, placed on nonrebreather and was bagged until arrival.  She was immediately intubated with rocuronium on arrival.   I have seen her previously in May.  At that time I noted that she was having daily episodes of decreased responsiveness.  At that time, it was simply because family had witnessed that she was brought into the emergency department.   Past Medical History:  Diagnosis Date   Acute encephalopathy 02/11/2019   Acute kidney injury superimposed on CKD ([place]) 10/24/2017   Acute on chronic anemia 01/03/2019   Acute on chronic congestive heart failure (HCC)    Acute renal failure superimposed on stage 3b chronic kidney disease ([place]) 01/08/2019   Acute respiratory failure with hypoxia ([place]) 09/16/2014   Acute respiratory failure with hypoxia and hypercapnia (HCC) 04/19/2021   Altered mental status    Angioedema    Anxiety and depression    Aphasia 06/28/2017   Cerebral atrophy ([place]) 06/19/2021   Chest pain 07/15/2018   CHF (congestive heart failure) (HCC)    Chronic constipation    Chronic kidney disease    stage 3   Chronic kidney disease, stage 4 (severe) ([place]) 06/19/2021   COPD (chronic obstructive pulmonary disease) ([place])    COPD exacerbation ([place]) 11/30/2017   COPD with acute exacerbation ([place]) 02/24/2018   COVID-19 12/21/2018   COVID-19 virus infection 12/21/2018   Delirium    Diabetes mellitus type 2, uncomplicated ([place]) 23/10/5318   Diabetes mellitus without complication (HCC)    Elevated  rheumatoid factor 09/05/2017   Gallstones    GERD (gastroesophageal reflux disease)    Hematemesis 04/28/2018   Hemoptysis 12/20/2018   Hypertension    Influenza A 04/13/2018   Left knee pain 06/12/2016   Left ventricular hypertrophy    Localized edema 05/10/2018   Osteoarthritis    Pain in finger of left hand 09/12/2016   Pain in soft tissues of limb 05/20/2013   Formatting of this note might be different from the original. Last Assessment & Plan:  Handled by [facility] doctor; wearing boot   Palliative care by specialist    Palliative care encounter 05/10/2018   Possible Seizures ([place]) 10/08/2017   Pressure injury of skin 10/29/2017   Prurigo nodularis    Right hand pain 03/21/2017   Seizures ([place])    Sepsis ([place]) 10/24/2017   Sepsis due to gram-negative UTI ([place]) 06/19/2019   Sepsis secondary to UTI ([place]) 01/08/2019   Stroke ([place])    Stroke ([place]) 06/19/2019   Stroke-like episode ([place]) s/p IV tpa 10/04/2017   Tobacco abuse    Urinary tract infection 11/22/2017   UTI (urinary tract infection) 08/21/2018   Vitamin D deficiency disease      Family History  Problem Relation Age of Onset   Alcohol abuse Mother    Sickle cell anemia Daughter    Hypertension Son    Cancer Neg Hx    COPD Neg Hx    Diabetes Neg Hx    Heart disease Neg  Hx    Stroke Neg Hx      Social History:  reports that she has quit smoking. Her smoking use included cigarettes. She has a 30.00 pack-year smoking history. She has never used smokeless tobacco. She reports that she does not drink alcohol and does not use drugs.   Exam: Current vital signs: BP (!) 140/90   Pulse 90   Resp (!) 26   SpO2 100%  Vital signs in last 24 hours: Pulse Rate:  [90] 90 (11/04 1503) Resp:  [26] 26 (11/04 1503) BP: (140)/(90) 140/90 (11/04 1510) SpO2:  [100 %] 100 % (11/04 1503)   Physical Exam  In bed, intubated, recently received vecuronium  Neuro: Limited due to paralytic Mental Status: Does not open eyes or follow commands  cranial Nerves: II: Does not blink to threat. Pupils are equal, round, and reactive to light.   III,IV, VI: Eyes are slightly disconjugate No blink to eyelid stimulation Motor: No movement to noxious stimulation  sensory: As above  Deep Tendon Reflexes: Absent  Cerebellar: Does not perform   I have reviewed labs in epic and the results pertinent to this consultation are: Baseline creatinine 1.5-1.8, resulting in a GFR around 30  I have reviewed the images obtained: CT head-no acute findings  Impression: 81 year old female who presents with recurrent spells concerning for seizures resulting in intubation.  I do think that she will need to be transferred for LTM EEG, but we can get a rapid EEG to exclude status prior to transfer.  Recommendations: 1) cerebellar EEG 2) recommend transfer to [facility] for LTM EEG 3) I will give a single dose of 2 g IV Keppra, then continue 750 twice daily 4) Vimpat 100 mg twice daily  This patient is critically ill and at significant risk of neurological worsening, death and care requires constant monitoring of vital signs, hemodynamics,respiratory and cardiac monitoring, neurological assessment, discussion with family, other specialists and medical decision making of high complexity. I spent 60 minutes of neurocritical care time  in the care of  this patient. This was time spent independent of any time provided by nurse practitioner or PA.  Roland Rack, MD [facility] [phone]  If 7pm- 7am, please page neurology on call as listed in [place]. 12/17/2021  5:59 PM

## 2021-12-17 NOTE — ED Provider Notes (Signed)
Vitals:   12/17/21 1925 12/17/21 1932  BP: (!) 139/99 129/76  Pulse: (!) 59 63  Resp: (!) 48 18  Temp: 99 F (37.2 C) 99 F (37.2 C)  SpO2: 100% 99%    Patient now with purposeful movements.  Reaching up towards IV.  Showing improvement and purposeful movement now.  For sedation patient on Versed drip at 5 mg/h, Versed bolus given to facilitate sedation during transfer.  Patient calm resting comfortably appropriate for transfer with critical care team at this time   Delman Kitten, MD 12/17/21 1939

## 2021-12-17 NOTE — Progress Notes (Signed)
eLink Physician-Brief Progress Note Patient Name: Katherine [name] DOB: 18-Jun-1940 MRN: [id number]   Date of Service  12/17/2021  HPI/Events of Note  81 yr old female female NHR with history of COPD admitted with status epilepticus and UTI.  Intubated.  Evaluated by neurology.  eICU Interventions  Chart reviewed     Intervention Category Evaluation Type: New Patient Evaluation  Mauri Brooklyn, P 12/17/2021, 10:05 PM

## 2021-12-17 NOTE — ED Provider Notes (Signed)
Vitals:   12/17/21 1740 12/17/21 1750  BP: 107/77 102/69  Pulse: (!) 57 (!) 59  Resp: 18 16  Temp: 98.5 F (36.9 C) 98.5 F (36.9 C)  SpO2: 98% 99%     Patient's noted to have twitching possible seizure-like activity while on the vent.  IV Ativan. Discussed with Dr. Leonel Ramsay as well. Dr. Leonel Ramsay ordering vimpat.  Patient has ready bed at [facility], awaiting critical care transport team's arrival estimated to be around 7 PM   Delman Kitten, MD 12/17/21 [phone]

## 2021-12-17 NOTE — ED Notes (Signed)
Report called to facility by previous RN.  Transfer of care report given to [facility] with [facility] over telephone.  Pt current condition, Dx, medications received, medications running, lines, labs, tubes, and vitals were discussed.  All questions asked were answered.  Pt is currently stabilized and is being transferred to EMS stretcher.

## 2021-12-17 NOTE — ED Notes (Signed)
Pt taken for CT 

## 2021-12-17 NOTE — ED Provider Notes (Signed)
[facility] Provider Note    Event Date/Time   First MD Initiated Contact with Patient 12/17/21 1517     (approximate)   History   Respiratory Distress  EM caveat: Patient nonverbal, unresponsive  HPI  Katherine [name] is a 81 y.o. female with a history of obesity hypoventilation, CHF, seizure disorder  EMS was called out for the patient being evidently unresponsive possibly had a seizure at the nursing facility.  While in the care of EMS the patient had a witnessed by EMS seizure, patient was treated with IV midazolam.  The patient became responsive and conversant thereafter, and then EMS reports she again became less responsive bradycardia apneic and unresponsive requiring nonrebreather mask.  There is no report of any falls or injuries.  No traumas.  Patient has a known history of seizure disorder and is residing in a nursing facility     Physical Exam   Triage Vital Signs: ED Triage Vitals  Enc Vitals Group     BP 12/17/21 1510 (!) 140/90     Pulse Rate 12/17/21 1503 90     Resp 12/17/21 1503 (!) 26     Temp --      Temp src --      SpO2 12/17/21 1503 100 %     Weight --      Height --      Head Circumference --      Peak Flow --      Pain Score --      Pain Loc --      Pain Edu? --      Excl. in [place]? --     Most recent vital signs: Vitals:   12/17/21 1650 12/17/21 1657  BP: (!) 164/93   Pulse: (!) 58   Resp: 18   Temp:  97.8 F (36.6 C)  SpO2: 97%      General: Unresponsive slightly to sternal rub, opens eyes slightly but no verbalizations.  Localizes noxious stimuli CV:  Good peripheral perfusion.  Normal heart tones Resp:  Bradypnea and insufficient respiratory effort.  Slight rhonchi noted centrally in the chest, bradypnea.  Oxygen saturation approximately 90% on room air.  Patient required bag-valve-mask ventilation Abd:  No distention.  Other:  Morbid obesity.  No obvious bruising trauma or injuries to noted on the  body   ED Results / Procedures / Treatments   Labs (all labs ordered are listed, but only abnormal results are displayed) Labs Reviewed  CBC WITH DIFFERENTIAL/PLATELET - Abnormal; Notable for the following components:      Result Value   RBC 3.77 (*)    Hemoglobin 11.0 (*)    HCT 35.7 (*)    All other components within normal limits  COMPREHENSIVE METABOLIC PANEL - Abnormal; Notable for the following components:   Potassium 5.7 (*)    CO2 36 (*)    Glucose, Bld 121 (*)    Creatinine, Ser 1.77 (*)    Calcium 8.7 (*)    Albumin 3.2 (*)    GFR, Estimated 29 (*)    All other components within normal limits  BLOOD GAS, ARTERIAL - Abnormal; Notable for the following components:   pCO2 arterial 53 (*)    pO2, Arterial 192 (*)    Bicarbonate 35.2 (*)    Acid-Base Excess 9.4 (*)    All other components within normal limits  CULTURE, BLOOD (ROUTINE X 2)  CULTURE, BLOOD (ROUTINE X 2)  LACTIC ACID, PLASMA  LACTIC ACID,  PLASMA  AMMONIA  URINALYSIS, ROUTINE W REFLEX MICROSCOPIC  AMMONIA  CBG MONITORING, ED  CBG MONITORING, ED  CBG MONITORING, ED  CBG MONITORING, ED  CBG MONITORING, ED  CBG MONITORING, ED     EKG  Interpreted by me at 1505 heart rate 90 QRS 100 QTc 460 Normal sinus rhythm, no evidence of acute ischemia or ectopy is noted.   RADIOLOGY   I personally interpreted the patient's CT scan of the head is negative for acute gross intracranial hemorrhage   CT Head Wo Contrast  Result Date: 12/17/2021 CLINICAL DATA:  Loss of consciousness.  Delirium. EXAM: CT HEAD WITHOUT CONTRAST TECHNIQUE: Contiguous axial images were obtained from the base of the skull through the vertex without intravenous contrast. RADIATION DOSE REDUCTION: This exam was performed according to the departmental dose-optimization program which includes automated exposure control, adjustment of the mA and/or kV according to patient size and/or use of iterative reconstruction technique. COMPARISON:   09/08/2021 FINDINGS: Brain: No evidence of intracranial hemorrhage, acute infarction, hydrocephalus, extra-axial collection, or mass lesion/mass effect. Mild generalized cerebral atrophy remains stable. Vascular:  No hyperdense vessel or other acute findings. Skull: No evidence of fracture or other significant bone abnormality. Sinuses/Orbits: Increased mucosal thickening involving the ethmoid sinuses. Other: None. IMPRESSION: No acute intracranial abnormality.  Mild cerebral atrophy. Increased mucosal thickening involving the ethmoid sinuses. Electronically Signed   By: Marlaine Hind M.D.   On: 12/17/2021 16:11       PROCEDURES:  CRITICAL CARE Performed by: Delman Kitten   Total critical care time: 50 minutes  Critical care time was exclusive of separately billable procedures and treating other patients.  Critical care was necessary to treat or prevent imminent or life-threatening deterioration.  Critical care was time spent personally by me on the following activities: development of treatment plan with patient and/or surrogate as well as nursing, discussions with consultants, evaluation of patient's response to treatment, examination of patient, obtaining history from patient or surrogate, ordering and performing treatments and interventions, ordering and review of laboratory studies, ordering and review of radiographic studies, pulse oximetry and re-evaluation of patient's condition.   Critical Care performed: Yes, see critical care procedure note(s)  Procedure Name: Intubation Date/Time: 12/17/2021 3:54 PM  Performed by: Delman Kitten, MDPre-anesthesia Checklist: Patient identified, Emergency Drugs available, Suction available and Patient being monitored Preoxygenation: Pre-oxygenation with 100% oxygen Induction Type: IV induction and Rapid sequence Laryngoscope Size: Glidescope and 3 Grade View: Grade II Tube type: Subglottic suction tube Tube size: 7.5 mm Number of attempts: 1 Airway  Equipment and Method: Patient positioned with wedge pillow Placement Confirmation: ETT inserted through vocal cords under direct vision, CO2 detector and Breath sounds checked- equal and bilateral Secured at: 22 (lip edge) cm Dental Injury: Teeth and Oropharynx as per pre-operative assessment  Comments: Patient edentulous  Intubation uncomplicated.  No desaturations.  No aspiration       MEDICATIONS ORDERED IN ED: Medications  propofol (DIPRIVAN) 1000 MG/100ML infusion (25 mcg/kg/min  105.9 kg Intravenous Rate/Dose Change 12/17/21 1658)  levETIRAcetam (KEPPRA) 2,000 mg in sodium chloride 0.9 % 250 mL IVPB (2,000 mg Intravenous New Bag/Given 12/17/21 1616)  lactated ringers bolus 1,000 mL (1,000 mLs Intravenous New Bag/Given 12/17/21 1631)  etomidate (AMIDATE) injection (30 mg Intravenous Given 12/17/21 1513)  vecuronium (NORCURON) injection (12 mg Intravenous Given 12/17/21 1514)   ----------------------------------------- 5:29 PM on 12/17/2021 -----------------------------------------  Vitals:   12/17/21 1715 12/17/21 1725  BP: (!) 160/108 (!) 150/85  Pulse: (!) 56 (!) 59  Resp: 14 16  Temp: 98.3 F (36.8 C) 98.5 F (36.9 C)  SpO2: 100% 100%   ----------------------------------------- 5:30 PM on 12/17/2021 -----------------------------------------  Patient calm.  Sedate on ventilator.  Propofol infusion.  No evidence of twitching or obvious seizure-like activity denoted.  Patient is stable for transfer to [facility] at this time for need for emergent EEG monitoring and further care under the ICU team  Labs interpreted as chronic anemia.  Mildly elevated potassium suspect this is due to some hemolysis.  Chronic renal disease.  Have requested respiratory therapy retract endotracheal tube approximately 4 cm after reviewing the patient's portable chest x-ray  IMPRESSION / MDM / ASSESSMENT AND PLAN / ED COURSE  I reviewed the triage vital signs and the nursing notes.                               Differential diagnosis includes, but is not limited to, seizure, status epilepticus, medication effect after midazolam, prolonged postictal state, encephalopathy, intracranial hemorrhage, stroke, or other potential cause such as severe electrolyte disturbance or derangement, hypoventilation syndrome, hypercapnia, etc.  The differential diagnosis quite broad also include etiology such as sepsis though the patient is afebrile and there is no obvious infection on clinical exam but further labs including cultures urinalysis pending.  Appreciate stat consult by neurology Pat.  Dr. Leonel Ramsay evaluated the patient approximately 3:30 PM in the ED, ordered Keppra bolus and stat bedside screening EEG.    Patient's presentation is most consistent with acute presentation with potential threat to life or bodily function.  The patient is on the cardiac monitor to evaluate for evidence of arrhythmia and/or significant heart rate changes.  Clinical Course as of 12/17/21 1659  Sat Dec 17, 2021  1613 Called, attempted to reach Mr. Bennie Hind.  No answer. Voicmailbox full. Unable to reach  [MQ]  0762 Called patient's Daughter, [name] phone number. No answer. [MQ]  1636 Poke with Dr. Leonel Ramsay, currently not able to provide spot EEG at this time due to technical difficulties experienced with the device.  He advises to arrange transfer for EEG monitoring at [facility].  Dr. Leonel Ramsay has also been unsuccessful in contacting the patient next of kin or family.  At this point we will plan to transfer expeditiously to [place].  Implied consent due to concern for potential emergency condition. [MQ]  1636 [facility] being called at this time [MQ]    Clinical Course User Index [MQ] Delman Kitten, MD   ----------------------------------------- 4:59 PM on 12/17/2021 ----------------------------------------- Patient is excepted in transfer and bed placement advising a bed is available at 4 N.  ICU at [facility] where continuous EEG monitoring is available.  The patient has been accepted to the critical care medicine service by Dr. Noemi Chapel.  FINAL CLINICAL IMPRESSION(S) / ED DIAGNOSES   Final diagnoses:  Acute respiratory failure with hypoxia ([place])  Seizure ([place])     Rx / DC Orders   ED Discharge Orders     None        Note:  This document was prepared using Dragon voice recognition software and may include unintentional dictation errors.   Delman Kitten, MD 12/17/21 1731

## 2021-12-17 NOTE — ED Provider Notes (Signed)
Patient's seizure-like activity has stopped.  She is currently resting.  Patient flaccid in extremities.  Her blood pressure has dropped and her heart rate is dropped, map of 68, blood pressure in the mid 80s.  At this point given patient also having some bradycardia with a heart rate in the low 50s I have stopped her propofol.  Give fluid bolus.  Notify Dr. Leonel Ramsay as well.  At this juncture monitoring closely as patient is borderline on potential need for pressor support.  At this point blood pressure is adequate, but continue to manage her closely.  Fluid bolus administering.   Delman Kitten, MD 12/17/21 1850

## 2021-12-17 NOTE — Progress Notes (Signed)
Unfortunately, rapid EEG not working currently. Will get study at [facility].  Roland Rack, MD [facility] [phone]  If 7pm- 7am, please page neurology on call as listed in [place].

## 2021-12-17 NOTE — H&P (Signed)
NAMELeen [name], MRN:  [id number], DOB:  25-Oct-1940, LOS: 0 ADMISSION DATE:  (Not on file), CONSULTATION DATE:  11/4 REFERRING MD:  Dr. Leonel Ramsay, CHIEF COMPLAINT:  seizure   History of Present Illness:  81 year old female followed by [facility] as out pt (still full code). Resides at [facility]. EMS called 11/4 when pt found unresponsive. Had witnessed seizure by EMS for which she was treated w/ IV midazolam. After this episode she was interactive and able to converse. Shortly after had another episode where she lost LOC. Required BMV. Intubated on arrival to ER at [facility]. Seen by Neuro. Transferred to [facility] for LTM.   Pertinent  Medical History  CKD 3b, prior seizure, chronic CHF, angioedema, cerebral atrophy, seizures, COPD, DM tyoe 2, GERD, LVH Followed by palliative care, obesity  Significant Hospital Events: Including procedures, antibiotic start and stop dates in addition to other pertinent events   11/4: admitted to [facility] ED for seizure; intubated; transferring to [facility] for ceeg  Interim History / Subjective:   Intubated on mech vent Sedated on versed drip Neuro at bedside  Objective   There were no vitals taken for this visit.    Vent Mode: PCV FiO2 (%):  [60 %] 60 % Vt Set:  [18 mL-20 mL] 18 mL  No intake or output data in the 24 hours ending 12/17/21 1847 There were no vitals filed for this visit.  Examination: General:  critically ill appearing on mech vent HEENT: MM pink/moist; ETT in place Neuro: sedated; PERRL CV: s1s2, bradycardia 50s, no m/r/g PULM:  dim clear BS bilaterally; on mech vent PRVC GI: soft, bsx4 active  Extremities: warm/dry, no edema  Skin: no rashes or lesions appreciated   Resolved Hospital Problem list     Assessment & Plan:  Status Epilepticus Hx of seizures on Keppra and lacosamide at home Hx of Cerebral atrophy: baseline? Acute encephalopathy -CT head negative for acute abnormality P: -Admit to ICU with continuous telemetry -neuro  following; appreciate recs -cEEG -AED per neuro -versed gtt for RASS 0 to -1 -As needed Versed for seizure -Frequent neuro checks -limit sedating meds -check ammonia, uds, alcohol -abx for UTI; follow UC -Continue neuroprotective measures- normothermia, euglycemia, normocapnia, normoxia.  -Palliative care following outpt; consider palliative consult  Acute respiratory failure due to above COPD P: -CXR on arrival to confirm ETT position -LTVV strategy with tidal volumes of 6-8 cc/kg ideal body weight -check ABG and adjust settings accordingly  -Wean PEEP/FiO2 for SpO2 >92% -pulmicort, brovana, yupelri -prn albuterol for wheezing -VAP bundle in place -Daily SAT and SBT -PAD protocol in place -wean sedation for RASS goal 0 to -1 -Follow intermittent CXR and ABG PRN  Bradycardia: likely sedation related P: -telemetry monitoring -limit sedation for RASS 0 to -1  UTI: UA with leukocytes and bacteria P: -rocephin -follow bcx2, UC, tracheal aspirate -check Pct -trend LA -trend wbc/fever curve  Hyperkalemia CKD 3b P: -repeat bmp to evaluate K -consider giving lokelma and calcium gluc if elevated K -Trend BMP / urinary output -Replace electrolytes as indicated -Avoid nephrotoxic agents, ensure adequate renal perfusion  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 P: -resume sertraline   Best Practice (right click and "Reselect all SmartList Selections" daily)   Diet/type: NPO  w/ meds via tube DVT prophylaxis: prophylactic heparin  GI prophylaxis: H2B Lines: N/A Foley:  Yes, and it is still needed Code Status:  full code Last date of multidisciplinary goals of care discussion [11/4 spoke with husband over phone. He states that if her  heart were to stop to not do anything and would like patient to be DNR. States that he will be at bedside tomorrow am.]  Labs   CBC: Recent Labs  Lab 12/17/21 1547  WBC 5.5  NEUTROABS 3.1  HGB 11.0*  HCT 35.7*  MCV 94.7  PLT 097    Basic Metabolic Panel: Recent Labs  Lab 12/17/21 1547  NA 141  K 5.7*  CL 100  CO2 36*  GLUCOSE 121*  BUN 21  CREATININE 1.77*  CALCIUM 8.7*   GFR: Estimated Creatinine Clearance: 30.1 mL/min (A) (by C-G formula based on SCr of 1.77 mg/dL (H)). Recent Labs  Lab 12/17/21 1547  WBC 5.5  LATICACIDVEN 1.3    Liver Function Tests: Recent Labs  Lab 12/17/21 1547  AST 17  ALT 6  ALKPHOS 90  BILITOT 1.2  PROT 7.1  ALBUMIN 3.2*   No results for input(s): "LIPASE", "AMYLASE" in the last 168 hours. No results for input(s): "AMMONIA" in the last 168 hours.  ABG    Component Value Date/Time   PHART 7.43 12/17/2021 1547   PCO2ART 53 (H) 12/17/2021 1547   PO2ART 192 (H) 12/17/2021 1547   HCO3 35.2 (H) 12/17/2021 1547   TCO2 36 (H) 06/18/2021 1115   ACIDBASEDEF 0.2 02/14/2019 0935   O2SAT 99.8 12/17/2021 1547     Coagulation Profile: No results for input(s): "INR", "PROTIME" in the last 168 hours.  Cardiac Enzymes: No results for input(s): "CKTOTAL", "CKMB", "CKMBINDEX", "TROPONINI" in the last 168 hours.  HbA1C: HB A1C (BAYER DCA - WAIVED)  Date/Time Value Ref Range Status  09/07/2015 09:58 AM 5.8 <7.0 % Final    Comment:                                          Diabetic Adult            <7.0                                       Healthy Adult        4.3 - 5.7                                                           (DCCT/NGSP) American Diabetes Association's Summary of Glycemic Recommendations for Adults with Diabetes: Hemoglobin A1c <7.0%. More stringent glycemic goals (A1c <6.0%) may further reduce complications at the cost of increased risk of hypoglycemia.    Hgb A1c MFr Bld  Date/Time Value Ref Range Status   06/18/2021 09:53 AM 5.6 4.8 - 5.6 % Final    Comment:    (NOTE) Pre diabetes:          5.7%-6.4%  Diabetes:              >6.4%  Glycemic control for   <7.0% adults with diabetes  12/28/2019 06:10 PM 5.3 4.8 - 5.6 % Final    Comment:    (NOTE) Pre diabetes:          5.7%-6.4%  Diabetes:              >6.4%  Glycemic control for   <7.0% adults with diabetes     CBG: No results for input(s): "GLUCAP" in the last 168 hours.  Review of Systems:   Patient is encephalopathic and/or intubated. Therefore history has been obtained from chart review.    Past Medical History:  She,  has a past medical history of Acute encephalopathy (02/11/2019), Acute kidney injury superimposed on CKD ([place]) (10/24/2017), Acute on chronic anemia (01/03/2019), Acute on chronic congestive heart failure ([place]), Acute renal failure superimposed on stage 3b chronic kidney disease ([place]) (01/08/2019), Acute respiratory failure with hypoxia ([place]) (09/16/2014), Acute respiratory failure with hypoxia and hypercapnia ([place]) (04/19/2021), Altered mental status, Angioedema, Anxiety and depression, Aphasia (06/28/2017), Cerebral atrophy ([place]) (06/19/2021), Chest pain (07/15/2018), CHF (congestive heart failure) ([place]), Chronic constipation, Chronic kidney disease, Chronic kidney disease, stage 4 (severe) ([place]) (06/19/2021), COPD (chronic obstructive pulmonary disease) ([place]), COPD exacerbation ([place]) (11/30/2017), COPD with acute exacerbation ([place]) (02/24/2018), COVID-19 (12/21/2018), COVID-19 virus infection (12/21/2018), Delirium, Diabetes mellitus type 2, uncomplicated ([place]) (32/05/4008), Diabetes mellitus without complication ([place]), Elevated rheumatoid factor (09/05/2017), Gallstones, GERD (gastroesophageal reflux disease), Hematemesis (04/28/2018), Hemoptysis (12/20/2018), Hypertension, Influenza A (04/13/2018), Left knee pain (06/12/2016), Left ventricular hypertrophy, Localized edema (05/10/2018), Osteoarthritis, Pain in finger of left hand  (09/12/2016), Pain in soft tissues of limb (05/20/2013), Palliative care by specialist, Palliative care encounter (05/10/2018), Possible Seizures ([place]) (10/08/2017), Pressure injury of skin (10/29/2017), Prurigo nodularis, Right hand pain (03/21/2017), Seizures ([place]), Sepsis ([place]) (10/24/2017), Sepsis due to gram-negative UTI ([place]) (06/19/2019), Sepsis secondary to UTI ([place]) (01/08/2019), Stroke [facility]), Stroke ([place]) (06/19/2019), Stroke-like episode ([place]) s/p IV tpa (10/04/2017), Tobacco abuse, Urinary tract infection (11/22/2017), UTI (urinary tract infection) (08/21/2018), and Vitamin D deficiency disease.   Surgical History:   Past Surgical History:  Procedure Laterality Date   CHOLECYSTECTOMY     POLYPECTOMY  11/2011   vocal cord   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   Procedure: TOTAL KNEE ARTHROPLASTY;  Surgeon: Hessie Knows, MD;  Location: [facility] ORS;  Service: Orthopedics;  Laterality: Right;     Social History:   reports that she has quit smoking. Her smoking use included cigarettes. She has a 30.00 pack-year smoking history. She has never used smokeless tobacco. She reports that she does not drink alcohol and does not use drugs.   Family History:  Her family history includes Alcohol abuse in her mother; Hypertension in her son; Sickle cell anemia in her daughter. There is no history of Cancer, COPD, Diabetes, Heart disease, or Stroke.   Allergies Allergies  Allergen Reactions   Bee Venom Swelling   Enalapril Maleate Swelling     Home Medications  Prior to Admission medications   Medication Sig Start Date End Date Taking? Authorizing Provider  acetaminophen (TYLENOL) 650 MG CR tablet Take 650 mg by mouth every 8 (eight) hours as needed for pain.    [provider]  Fluticasone-Umeclidin-Vilant 100-62.5-25 MCG/INH AEPB Inhale 1 puff into the lungs in the morning.    [provider]  gabapentin (NEURONTIN) 100 MG capsule Take 2 capsules (200 mg total) by mouth at bedtime.  06/21/21   Ezequiel Essex, MD  hydroxychloroquine (PLAQUENIL) 200 MG tablet Take 200 mg by mouth daily. 12/07/21   [provider]  ipratropium-albuterol (  DUONEB) 0.5-2.5 (3) MG/3ML SOLN Take 3 mLs by nebulization every 6 (six) hours as needed. 06/21/21   Ezequiel Essex, MD  lacosamide 100 MG TABS Take 1 tablet (100 mg total) by mouth 2 (two) times daily. 12/31/18   Thurnell Lose, MD  levETIRAcetam (KEPPRA) 500 MG tablet Take 1 tablet (500 mg total) by mouth 2 (two) times daily. 10/26/21 10/21/22  Alric Ran, MD  lidocaine (LIDODERM) 5 % Place 1 patch onto the skin See admin instructions. Apply 1 patch to left knee topically at bedtime. 01/06/21   [provider]  pantoprazole (PROTONIX) 20 MG tablet Take 20 mg by mouth daily. 04/15/21   [provider]  propranolol (INDERAL) 10 MG tablet Take 10 mg by mouth 2 (two) times daily. 04/12/21   [provider]  sertraline (ZOLOFT) 50 MG tablet Take 1 tablet (50 mg total) by mouth daily. 06/22/21   Ezequiel Essex, MD  Skin Protectants, Misc. (EUCERIN) cream Apply 1 application. topically at bedtime.    [provider]  sodium chloride (OCEAN) 0.65 % SOLN nasal spray Place 3 sprays into both nostrils See admin instructions. 3 sprays alternating nostrils three times a day for nasal dryness    [provider]     Critical care time: 45 minutes    JD Rexene Agent [facility] 12/17/2021, 7:26 PM  Please see [email or web address] for pager details.  From 7A-7P if no response, please call [phone]. After hours, please call ELink [phone].

## 2021-12-17 NOTE — Consult Note (Signed)
Neurology Consultation  Reason for Consult: Seizures, status epilepticus Referring Physician: Dr. Silas Flood  CC: [facility] from [facility] for seizures  History is obtained from: Chart, consult note from Dr. Leonel Ramsay  HPI: Katherine [name] is a 81 y.o. female past history of CKD 3, COPD, diabetes, hypertension, seizures on Keppra and Vimpat at home, prior strokes-2019 also received tPA, frequent UTIs, presenting to the emergency room at [facility] with complaints of unresponsiveness and possible seizure. She was noticed to have seizure-like activity at home for which EMS was called.  On EMS arrival she was awake and talking to them but had a repeated seizure on the way to the hospital and got 5 mg of Versed.  She came out of this and became unresponsive again and was not breathing, initially placed on none rebreather and was bagged until arrival.  Immediately after arrival to [facility], she was intubated with rocuronium.  Given a load of IV Keppra.  Also given Vimpat IV and transferred to [facility] because of lack of availability of EEG at that hospital. No acute changes reported during transportation  ROS: Unable to obtain due to altered mental status.   Past Medical History:  Diagnosis Date   Acute encephalopathy 02/11/2019   Acute kidney injury superimposed on CKD ([place]) 10/24/2017   Acute on chronic anemia 01/03/2019   Acute on chronic congestive heart failure (HCC)    Acute renal failure superimposed on stage 3b chronic kidney disease ([place]) 01/08/2019   Acute respiratory failure with hypoxia ([place]) 09/16/2014   Acute respiratory failure with hypoxia and hypercapnia ([place]) 04/19/2021   Altered mental status    Angioedema    Anxiety and depression    Aphasia 06/28/2017   Cerebral atrophy ([place]) 06/19/2021   Chest pain 07/15/2018   CHF (congestive heart failure) (HCC)    Chronic constipation    Chronic kidney disease    stage 3   Chronic kidney  disease, stage 4 (severe) ([place]) 06/19/2021   COPD (chronic obstructive pulmonary disease) ([place])    COPD exacerbation ([place]) 11/30/2017   COPD with acute exacerbation ([place]) 02/24/2018   COVID-19 12/21/2018   COVID-19 virus infection 12/21/2018   Delirium    Diabetes mellitus type 2, uncomplicated ([place]) 50/06/3974   Diabetes mellitus without complication (HCC)    Elevated rheumatoid factor 09/05/2017   Gallstones    GERD (gastroesophageal reflux disease)    Hematemesis 04/28/2018   Hemoptysis 12/20/2018   Hypertension    Influenza A 04/13/2018   Left knee pain 06/12/2016   Left ventricular hypertrophy    Localized edema 05/10/2018   Osteoarthritis    Pain in finger of left hand 09/12/2016   Pain in soft tissues of limb 05/20/2013   Formatting of this note might be different from the original. Last Assessment & Plan:  Handled by [facility] doctor; wearing boot   Palliative care by specialist    Palliative care encounter 05/10/2018   Possible Seizures ([place]) 10/08/2017   Pressure injury of skin 10/29/2017   Prurigo nodularis    Right hand pain 03/21/2017   Seizures ([place])    Sepsis ([place]) 10/24/2017   Sepsis due to gram-negative UTI ([place]) 06/19/2019   Sepsis secondary to UTI ([place]) 01/08/2019   Stroke ([place])    Stroke ([place]) 06/19/2019   Stroke-like episode ([place]) s/p IV tpa 10/04/2017   Tobacco abuse    Urinary tract infection 11/22/2017   UTI (urinary tract infection) 08/21/2018   Vitamin D deficiency disease  Family History  Problem Relation Age of Onset   Alcohol abuse Mother    Sickle cell anemia Daughter    Hypertension Son    Cancer Neg Hx    COPD Neg Hx    Diabetes Neg Hx    Heart disease Neg Hx    Stroke Neg Hx      Social History:   reports that she has quit smoking. Her smoking use included cigarettes. She has a 30.00 pack-year smoking history. She has never used smokeless tobacco. She reports that she does not drink alcohol and does not use drugs.  Medications  Current  Facility-Administered Medications:    acetaminophen (TYLENOL) tablet 650 mg, 650 mg, Per Tube, Q4H PRN, Andres Labrum D, PA-C   albuterol (PROVENTIL) (2.5 MG/3ML) 0.083% nebulizer solution 2.5 mg, 2.5 mg, Nebulization, Q2H PRN, Rollene Rotunda, John D, PA-C   arformoterol (BROVANA) nebulizer solution 15 mcg, 15 mcg, Nebulization, BID, Payne, John D, PA-C   budesonide (PULMICORT) nebulizer solution 0.25 mg, 0.25 mg, Nebulization, BID, Payne, John D, PA-C   cefTRIAXone (ROCEPHIN) 2 g in sodium chloride 0.9 % 100 mL IVPB, 2 g, Intravenous, Q24H, Payne, John D, PA-C   Chlorhexidine Gluconate Cloth 2 % PADS 6 each, 6 each, Topical, Daily, Amie Portland, MD   Derrill Memo ON 12/18/2021] docusate (COLACE) 50 MG/5ML liquid 100 mg, 100 mg, Per Tube, BID, Rollene Rotunda, John D, PA-C   famotidine (PEPCID) tablet 20 mg, 20 mg, Per Tube, BID, Rollene Rotunda, John D, PA-C   fentaNYL (SUBLIMAZE) injection 25 mcg, 25 mcg, Intravenous, Q15 min PRN, Rollene Rotunda, John D, PA-C   fentaNYL (SUBLIMAZE) injection 25-100 mcg, 25-100 mcg, Intravenous, Q30 min PRN, Rollene Rotunda, John D, PA-C   heparin injection 5,000 Units, 5,000 Units, Subcutaneous, Q8H, Payne, John D, PA-C   [START ON 12/18/2021] hydroxychloroquine (PLAQUENIL) tablet 200 mg, 200 mg, Per Tube, Daily, Rollene Rotunda, John D, PA-C   insulin aspart (novoLOG) injection 0-9 Units, 0-9 Units, Subcutaneous, Q4H, Rollene Rotunda, John D, PA-C   lacosamide (VIMPAT) 200 mg in sodium chloride 0.9 % 25 mL IVPB, 200 mg, Intravenous, Q12H, Amie Portland, MD   levETIRAcetam (KEPPRA) IVPB 1000 mg/100 mL premix, 1,000 mg, Intravenous, Q12H, Amie Portland, MD   midazolam (VERSED) 100 mg/100 mL (1 mg/mL) premix infusion, 0-10 mg/hr, Intravenous, Continuous, Rollene Rotunda, John D, PA-C   midazolam (VERSED) bolus via infusion 0-5 mg, 0-5 mg, Intravenous, Q1H PRN, Rollene Rotunda, John D, PA-C   polyethylene glycol (MIRALAX / GLYCOLAX) packet 17 g, 17 g, Per Tube, Daily, Rollene Rotunda, John D, PA-C   [START ON 12/18/2021] revefenacin (YUPELRI) nebulizer solution 175 mcg,  175 mcg, Nebulization, Daily, Rollene Rotunda, John D, PA-C   [START ON 12/18/2021] sertraline (ZOLOFT) tablet 50 mg, 50 mg, Per Tube, Daily, Mick Sell, PA-C   Exam: Current vital signs: Temp 98.5 F (36.9 C) (Axillary)   Ht 5\' 1"  (1.549 m)   Wt 106.5 kg   BMI 44.36 kg/m  Vital signs in last 24 hours: Temp:  [97.8 F (36.6 C)-99 F (37.2 C)] 98.5 F (36.9 C) (11/04 2035) Pulse Rate:  [49-90] 63 (11/04 1932) Resp:  [9-48] 18 (11/04 1932) BP: (82-171)/(55-134) 129/76 (11/04 1932) SpO2:  [91 %-100 %] 99 % (11/04 1932) FiO2 (%):  [60 %] 60 % (11/04 2101) Weight:  [106.5 kg] 106.5 kg (11/04 2035) General: Sedated on Versed and fentanyl, intubated HEENT: Normocephalic atraumatic Lungs: Vented Cardiovascular: Regular rhythm Neurological exam Sedated intubated Pupils equal round reactive Breathing over the ventilator Corneal reflexes present bilaterally Unresponsive and nonverbal No  spontaneous movement To noxious stimulation attempts to localize bilaterally with upper extremities.  Mild withdrawal to noxious stimulation in lower extremities.  Labs I have reviewed labs in epic and the results pertinent to this consultation are:  CBC    Component Value Date/Time   WBC 5.5 12/17/2021 1547   RBC 3.77 (L) 12/17/2021 1547   HGB 11.0 (L) 12/17/2021 1547   HGB 12.6 03/27/2017 0948   HCT 35.7 (L) 12/17/2021 1547   HCT 37.9 03/27/2017 0948   PLT 167 12/17/2021 1547   PLT 252 03/27/2017 0948   MCV 94.7 12/17/2021 1547   MCV 95 03/27/2017 0948   MCV 94 02/20/2013 1147   MCH 29.2 12/17/2021 1547   MCHC 30.8 12/17/2021 1547   RDW 13.1 12/17/2021 1547   RDW 16.2 (H) 03/27/2017 0948   RDW 14.5 02/20/2013 1147   LYMPHSABS 1.6 12/17/2021 1547   LYMPHSABS 2.6 03/27/2017 0948   LYMPHSABS 0.8 (L) 01/23/2013 0422   MONOABS 0.5 12/17/2021 1547   MONOABS 0.1 (L) 01/23/2013 0422   EOSABS 0.2 12/17/2021 1547   EOSABS 0.1 03/21/2017 0950   EOSABS 0.0 01/23/2013 0422   BASOSABS 0.0 12/17/2021  1547   BASOSABS 0.0 03/21/2017 0950   BASOSABS 0.0 01/23/2013 0422    CMP     Component Value Date/Time   NA 141 12/17/2021 1547   NA 142 03/21/2017 0950   NA 139 02/20/2013 1147   K 5.7 (H) 12/17/2021 1547   K 4.1 02/20/2013 1147   CL 100 12/17/2021 1547   CL 111 (H) 02/20/2013 1147   CO2 36 (H) 12/17/2021 1547   CO2 23 02/20/2013 1147   GLUCOSE 121 (H) 12/17/2021 1547   GLUCOSE 115 (H) 02/20/2013 1147   BUN 21 12/17/2021 1547   BUN 19 03/21/2017 0950   BUN 17 02/20/2013 1147   CREATININE 1.77 (H) 12/17/2021 1547   CREATININE 1.58 (H) 02/20/2013 1147   CALCIUM 8.7 (L) 12/17/2021 1547   CALCIUM 9.0 02/20/2013 1147   PROT 7.1 12/17/2021 1547   PROT 6.7 03/21/2017 0950   PROT 7.5 01/22/2013 0917   ALBUMIN 3.2 (L) 12/17/2021 1547   ALBUMIN 4.1 03/21/2017 0950   ALBUMIN 3.5 01/22/2013 0917   AST 17 12/17/2021 1547   AST 22 01/22/2013 0917   ALT 6 12/17/2021 1547   ALT 15 01/22/2013 0917   ALKPHOS 90 12/17/2021 1547   ALKPHOS 92 01/22/2013 0917   BILITOT 1.2 12/17/2021 1547   BILITOT 0.2 03/21/2017 0950   BILITOT 0.4 01/22/2013 0917   GFRNONAA 29 (L) 12/17/2021 1547   GFRNONAA 33 (L) 02/20/2013 1147   GFRAA 45 (L) 09/27/2019 0827   GFRAA 38 (L) 02/20/2013 1147    Imaging I have reviewed the images obtained:  C CT head no acute findings  Assessment: 81 year old with recurrent spells of unresponsiveness concerning for seizures, after the last spell had difficulty breathing requiring emergent intubation. Loaded with Keppra and Vimpat at the outside hospital prior to transporting to [place] up to rapid EEG-preliminary review negative for status epilepticus.  Also has a possible UTI-which might have lowered seizure threshold  Impression: Status epilepticus, worsening seizure frequency likely due to lowering of seizure threshold in the setting of UTI and other metabolic derangements  Recommendations: Appreciate PCCM care of the patient. Vent  management per PCCM Keppra 1 g twice daily Continue Vimpat 100 twice daily Versed camet at 10mg /h -->reduced to 5mg . Maintain seizure precautions Continue rapid EEG for now.  We will get  a routine EEG in the morning once her technologist is available. Treatment of toxic metabolic derangements per primary team as you are Neurology will be following the patient with you. Plan discussed with Mikki Harbor, PA-C-critical care service -- Amie Portland, MD Neurologist [facility] Pager: [phone]  CRITICAL CARE ATTESTATION Performed by: Amie Portland, MD Total critical care time: 31 minutes Critical care time was exclusive of separately billable procedures and treating other patients and/or supervising APPs/Residents/Students Critical care was necessary to treat or prevent imminent or life-threatening deterioration due to status epilepticus This patient is critically ill and at significant risk for neurological worsening and/or death and care requires constant monitoring. Critical care was time spent personally by me on the following activities: development of treatment plan with patient and/or surrogate as well as nursing, discussions with consultants, evaluation of patient's response to treatment, examination of patient, obtaining history from patient or surrogate, ordering and performing treatments and interventions, ordering and review of laboratory studies, ordering and review of radiographic studies, pulse oximetry, re-evaluation of patient's condition, participation in multidisciplinary rounds and medical decision making of high complexity in the care of this patient.

## 2021-12-17 NOTE — ED Triage Notes (Addendum)
Pt arrives via EMS d/t having an episode of LOC- pt was awake upon arrival of EMS but she had a seizure and got 5 of versed- pt did go back out again and was not breathing- pt on NRB by EMS- pt is still minimally responsive and was 100% when being bagged

## 2021-12-18 ENCOUNTER — Inpatient Hospital Stay (HOSPITAL_COMMUNITY): Payer: Medicare HMO

## 2021-12-18 DIAGNOSIS — G40901 Epilepsy, unspecified, not intractable, with status epilepticus: Secondary | ICD-10-CM | POA: Diagnosis not present

## 2021-12-18 DIAGNOSIS — R569 Unspecified convulsions: Secondary | ICD-10-CM | POA: Diagnosis not present

## 2021-12-18 LAB — BASIC METABOLIC PANEL
Anion gap: 9 (ref 5–15)
BUN: 15 mg/dL (ref 8–23)
CO2: 27 mmol/L (ref 22–32)
Calcium: 8.6 mg/dL — ABNORMAL LOW (ref 8.9–10.3)
Chloride: 103 mmol/L (ref 98–111)
Creatinine, Ser: 1.63 mg/dL — ABNORMAL HIGH (ref 0.44–1.00)
GFR, Estimated: 32 mL/min — ABNORMAL LOW (ref >60)
Glucose, Bld: 103 mg/dL — ABNORMAL HIGH (ref 70–99)
Potassium: 4.4 mmol/L (ref 3.5–5.1)
Sodium: 139 mmol/L (ref 135–145)

## 2021-12-18 LAB — GLUCOSE, CAPILLARY
Glucose-Capillary: 123 mg/dL — ABNORMAL HIGH (ref 70–99)
Glucose-Capillary: 118 mg/dL — ABNORMAL HIGH (ref 70–99)
Glucose-Capillary: 100 mg/dL — ABNORMAL HIGH (ref 70–99)
Glucose-Capillary: 95 mg/dL (ref 70–99)
Glucose-Capillary: 78 mg/dL (ref 70–99)
Glucose-Capillary: 111 mg/dL — ABNORMAL HIGH (ref 70–99)
Glucose-Capillary: 119 mg/dL — ABNORMAL HIGH (ref 70–99)

## 2021-12-18 LAB — CBC
HCT: 35.2 % — ABNORMAL LOW (ref 36.0–46.0)
Hemoglobin: 11.4 g/dL — ABNORMAL LOW (ref 12.0–15.0)
MCH: 29.8 pg (ref 26.0–34.0)
MCHC: 32.4 g/dL (ref 30.0–36.0)
MCV: 91.9 fL (ref 80.0–100.0)
Platelets: 143 10*3/uL — ABNORMAL LOW (ref 150–400)
RBC: 3.83 MIL/uL — ABNORMAL LOW (ref 3.87–5.11)
RDW: 13.2 % (ref 11.5–15.5)
WBC: 7.9 10*3/uL (ref 4.0–10.5)
nRBC: 0 % (ref 0.0–0.2)

## 2021-12-18 LAB — MAGNESIUM: Magnesium: 1.7 mg/dL (ref 1.7–2.4)

## 2021-12-18 LAB — PHOSPHORUS: Phosphorus: 2 mg/dL — ABNORMAL LOW (ref 2.5–4.6)

## 2021-12-18 LAB — AMMONIA: Ammonia: 40 umol/L — ABNORMAL HIGH (ref 9–35)

## 2021-12-18 LAB — MRSA NEXT GEN BY PCR, NASAL: MRSA by PCR Next Gen: DETECTED — AB

## 2021-12-18 IMAGING — DX DG CHEST 1V PORT
1 series · 1 of 1 positions shown · non-contrast
Comparison: 03/02/2019

CLINICAL DATA: Possible aspiration

EXAM:
PORTABLE CHEST 1 VIEW

[chest ap]
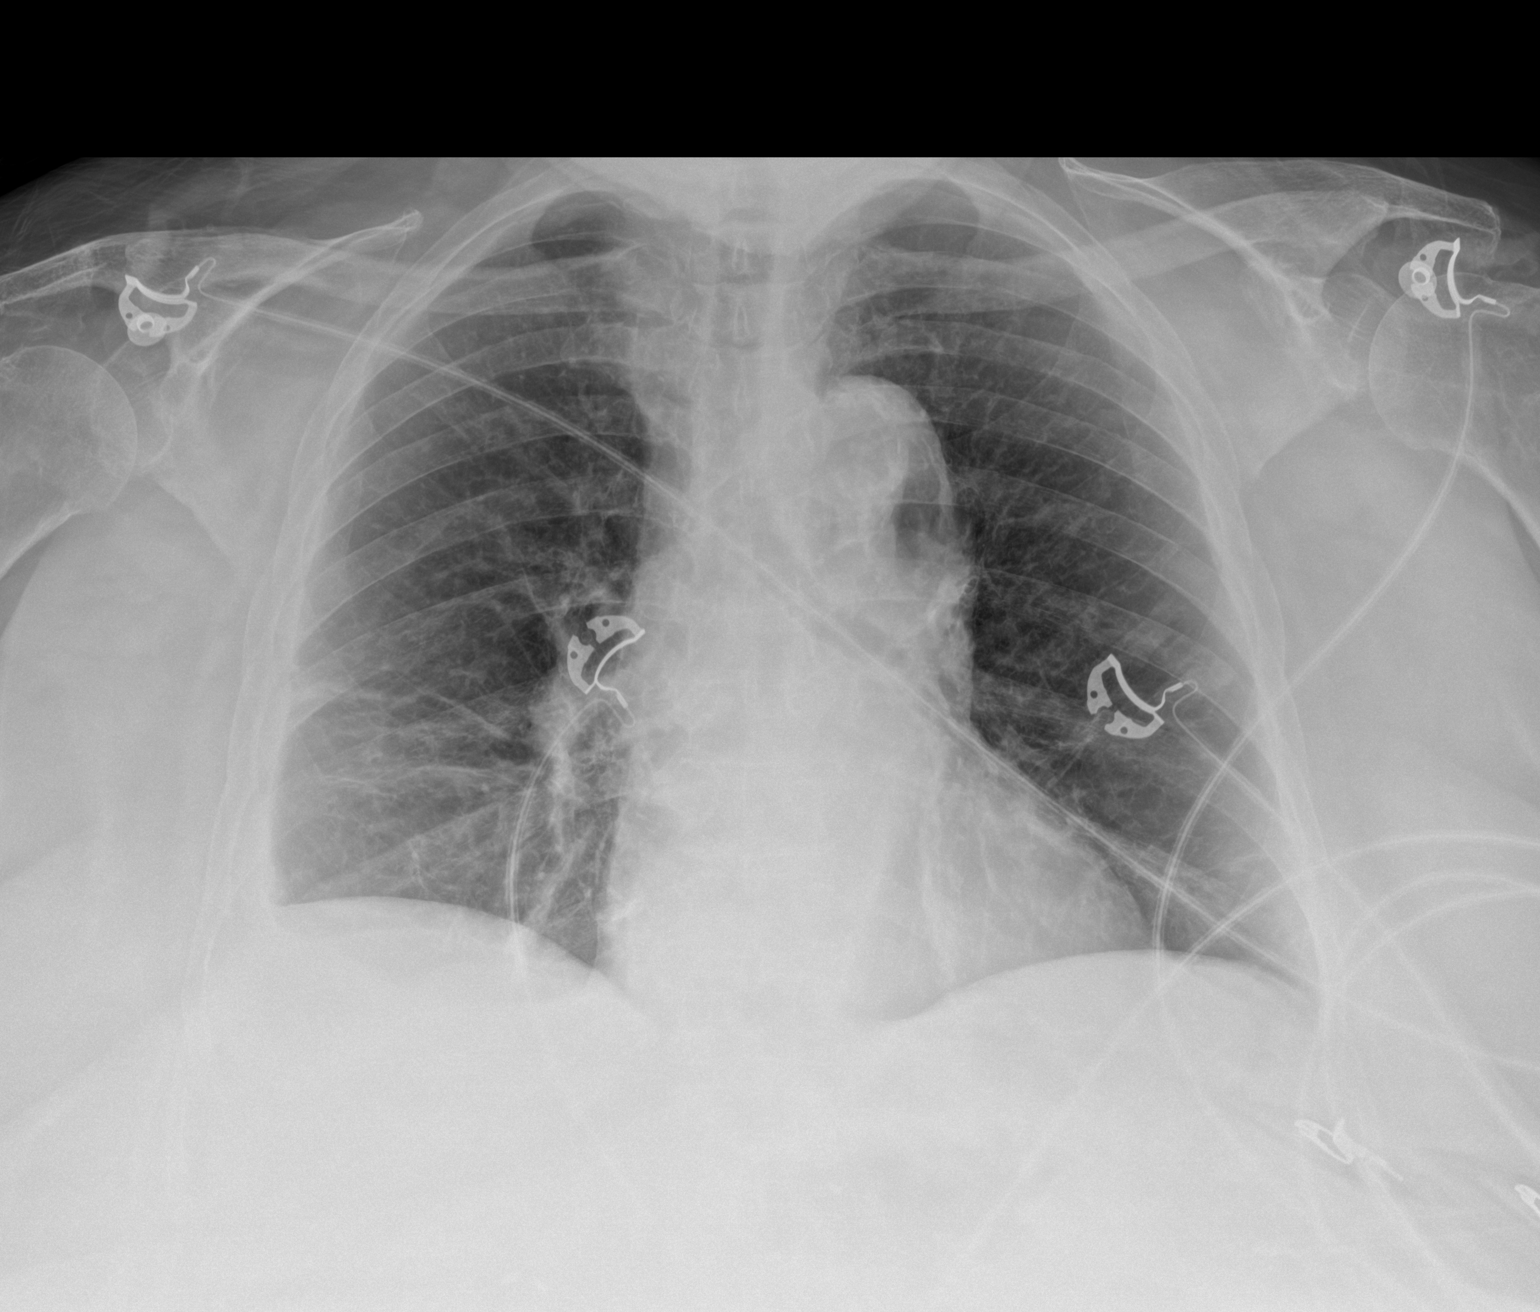

[1 of 1 positions shown; findings below may reference images not displayed]

FINDINGS: Cardiomediastinal contours and hilar structures are stable.

Similar pattern of linear opacities at the RIGHT lung base,.

LEFT retrocardiac opacity also similar to the prior study.

Signs of pleural thickening along the RIGHT chest.

Visualized skeletal structures are unremarkable.
IMPRESSION: 1. Stable signs of scarring in the chest with chronic pleural
thickening and atelectasis at the RIGHT lung base. No new areas of
dense consolidation or sign of pleural effusion.

## 2021-12-18 MED ORDER — DOCUSATE SODIUM 50 MG/5ML PO LIQD
100.0000 mg | Freq: Two times a day (BID) | ORAL | Status: DC
  Administered 2021-12-18 (×2): 100 mg
  Filled 2021-12-18 (×2): qty 10

## 2021-12-18 MED ORDER — LEVETIRACETAM IN NACL 500 MG/100ML IV SOLN
500.0000 mg | Freq: Two times a day (BID) | INTRAVENOUS | Status: DC
  Administered 2021-12-18 – 2021-12-21 (×6): 500 mg via INTRAVENOUS
  Filled 2021-12-18 (×6): qty 100

## 2021-12-18 MED ORDER — DEXMEDETOMIDINE HCL IN NACL 400 MCG/100ML IV SOLN
0.0000 ug/kg/h | INTRAVENOUS | Status: DC
  Filled 2021-12-18: qty 100

## 2021-12-18 MED ORDER — HYDRALAZINE HCL 20 MG/ML IJ SOLN: 10.0000 mg | INTRAMUSCULAR | Status: DC | PRN

## 2021-12-18 MED ORDER — CLEVIDIPINE BUTYRATE 0.5 MG/ML IV EMUL
0.0000 mg/h | INTRAVENOUS | Status: DC
  Filled 2021-12-18: qty 50

## 2021-12-18 MED ORDER — PROSOURCE TF20 ENFIT COMPATIBL EN LIQD
60.0000 mL | Freq: Every day | ENTERAL | Status: DC
  Administered 2021-12-18 – 2021-12-19 (×2): 60 mL
  Filled 2021-12-18 (×2): qty 60

## 2021-12-18 MED ORDER — ORAL CARE MOUTH RINSE
15.0000 mL | OROMUCOSAL | Status: DC
  Administered 2021-12-18 – 2021-12-21 (×38): 15 mL via OROMUCOSAL

## 2021-12-18 MED ORDER — FAMOTIDINE 20 MG PO TABS
10.0000 mg | ORAL_TABLET | Freq: Every day | ORAL | Status: DC
  Administered 2021-12-19 – 2021-12-20 (×2): 10 mg
  Filled 2021-12-18 (×2): qty 1

## 2021-12-18 MED ORDER — VITAL HIGH PROTEIN PO LIQD
1000.0000 mL | ORAL | Status: DC
  Administered 2021-12-18: 1000 mL

## 2021-12-18 MED ORDER — POLYETHYLENE GLYCOL 3350 17 G PO PACK
17.0000 g | PACK | Freq: Every day | ORAL | Status: DC
  Administered 2021-12-18: 17 g
  Filled 2021-12-18 (×2): qty 1

## 2021-12-18 MED ORDER — ORAL CARE MOUTH RINSE: 15.0000 mL | OROMUCOSAL | Status: DC | PRN

## 2021-12-18 NOTE — Progress Notes (Signed)
LTM EEG hooked up and running - no initial skin breakdown - push button tested - [facility] monitoring.  

## 2021-12-18 NOTE — Procedures (Addendum)
Patient Name: Katherine [name]  MRN: [id number]  Epilepsy Attending: [name]  Referring Physician/Provider: Amie [place], MD  Duration: 12/17/2021 2045 to 12/18/2021 4758  Patient history: 81 y.o. female past history of CKD 3, COPD, diabetes, hypertension, seizures on Keppra and Vimpat at home, prior strokes-2019 also received tPA, frequent UTIs, presenting with unresponsiveness and possible seizure. EEG to evaluate for seizure  Level of alertness: lethargic/sedated  AEDs during EEG study: LEV, LCM, Versed  Technical aspects: This EEG was obtained using a 10 lead EEG system positioned circumferentially without any parasagittal coverage (rapid EEG). Computer selected EEG is reviewed as  well as background features and all clinically significant events.  Description: EEG showed continuous generalized 3 to 6 Hz theta-delta slowing admixed with 12-14hz  generalized beta activity.Hyperventilation and photic stimulation were not performed.     ABNORMALITY - Continuous slow, generalized  IMPRESSION: This limited ceribell EEG is suggestive of severe diffuse encephalopathy, nonspecific etiology. No seizures or epileptiform discharges were seen throughout the recording.  Jaques Mineer Barbra Sarks

## 2021-12-18 NOTE — Progress Notes (Addendum)
Subjective: Intubated and sedated.   Objective: Current vital signs: BP (!) 164/75   Pulse 61   Temp 98.8 F (37.1 C) (Axillary)   Resp 20   Ht 5\' 1"  (1.549 m)   Wt 103.3 kg   SpO2 98%   BMI 43.03 kg/m  Vital signs in last 24 hours: Temp:  [97.8 F (36.6 C)-99 F (37.2 C)] 98.8 F (37.1 C) (11/05 0800) Pulse Rate:  [49-90] 61 (11/05 0721) Resp:  [9-48] 20 (11/05 0721) BP: (82-172)/(55-134) 164/75 (11/05 0721) SpO2:  [91 %-100 %] 98 % (11/05 0728) FiO2 (%):  [40 %-60 %] 40 % (11/05 0728) Weight:  [103.3 kg-106.5 kg] 103.3 kg (11/05 0500)  Intake/Output from previous day: 11/04 0701 - 11/05 0700 In: 41 [I.V.:41] Out: 725 [Urine:725] Intake/Output this shift: No intake/output data recorded. Nutritional status:  Diet Order             Diet NPO time specified Except for: Other (See Comments)  Diet effective now                  General Exam: HEENT: [place]/AT. EEG tech placing leads.  Lungs: Intubated Ext: Warm and well perfused  Neurological exam: Ment: Sedated and intubated. To noxious stimulation attempts to localize bilaterally with upper extremities. No attempts to communicate. Not following any commands. Minimal eye opening to noxious.  CN: Pupils equal round reactive. Corneal reflexes present bilaterally. Cough and gag intact. Breathing over the ventilator. With stimulation, she appears to bite down on ET tube with resultant sustained clonus.  Motor/Sensory: Upper extremities with semipurposeful movement/localization to noxious. Prominent tremor to BUE following noxious stimuli that subsequently resolves.  Slight withdrawal to noxious stimulation in lower extremities.  Reflexes: 4+ left brachioradialis (clonus), 3+ right brachioradialis. 4+ bilateral patellae (clonus, left worse than right). 2+ left achilles, 1+ right achilles. Toes equivocal.  Cerebellar/Gait: Unable to assess    Lab Results: Results for orders placed or performed during the hospital  encounter of 12/17/21 (from the past 48 hour(s))  MRSA Next Gen by PCR, Nasal     Status: Abnormal   Collection Time: 12/17/21  8:35 PM   Specimen: Nasal Mucosa; Nasal Swab  Result Value Ref Range   MRSA by PCR Next Gen DETECTED (A) NOT DETECTED    Comment: CRITICAL RESULT CALLED TO, READ BACK BY AND VERIFIED WITH:  C/ EMILY B., RN 12/18/21 0050 A. LAFRANCE (NOTE) The GeneXpert MRSA Assay (FDA approved for NASAL specimens only), is one component of a comprehensive MRSA colonization surveillance program. It is not intended to diagnose MRSA infection nor to guide or monitor treatment for MRSA infections. Test performance is not FDA approved in patients less than 39 years old. Performed at [facility], [place] [address], [place], [place] [address]   Culture, Respiratory w Gram Stain (tracheal aspirate)     Status: None (Preliminary result)   Collection Time: 12/17/21  8:49 PM   Specimen: Tracheal Aspirate; Respiratory  Result Value Ref Range   Specimen Description TRACHEAL ASPIRATE    Special Requests NONE    Gram Stain      FEW WBC PRESENT, PREDOMINANTLY PMN FEW GRAM POSITIVE COCCI IN PAIRS IN CHAINS Performed at [facility], [place] [address] [place], [place] [address]    Culture PENDING    Report Status PENDING   I-STAT 7, (LYTES, BLD GAS, ICA, H+H)     Status: Abnormal   Collection Time: 12/17/21  9:58 PM  Result Value Ref  Range   pH, Arterial 7.385 7.35 - 7.45   pCO2 arterial 55.8 (H) 32 - 48 mmHg   pO2, Arterial 156 (H) 83 - 108 mmHg   Bicarbonate 33.4 (H) 20.0 - 28.0 mmol/L   TCO2 35 (H) 22 - 32 mmol/L   O2 Saturation 99 %   Acid-Base Excess 7.0 (H) 0.0 - 2.0 mmol/L   Sodium 140 135 - 145 mmol/L   Potassium 4.4 3.5 - 5.1 mmol/L   Calcium, Ion 1.20 1.15 - 1.40 mmol/L   HCT 33.0 (L) 36.0 - 46.0 %   Hemoglobin 11.2 (L) 12.0 - 15.0 g/dL   Patient temperature 98.5 F    Collection site RADIAL, ALLEN'S TEST ACCEPTABLE    Drawn by HIDE    Sample type ARTERIAL    Glucose, capillary     Status: Abnormal   Collection Time: 12/17/21 11:14 PM  Result Value Ref Range   Glucose-Capillary 114 (H) 70 - 99 mg/dL    Comment: Glucose reference range applies only to samples taken after fasting for at least 8 hours.  Glucose, capillary     Status: Abnormal   Collection Time: 12/18/21  3:40 AM  Result Value Ref Range   Glucose-Capillary 123 (H) 70 - 99 mg/dL    Comment: Glucose reference range applies only to samples taken after fasting for at least 8 hours.  Glucose, capillary     Status: Abnormal   Collection Time: 12/18/21  6:03 AM  Result Value Ref Range   Glucose-Capillary 118 (H) 70 - 99 mg/dL    Comment: Glucose reference range applies only to samples taken after fasting for at least 8 hours.  Glucose, capillary     Status: Abnormal   Collection Time: 12/18/21  7:29 AM  Result Value Ref Range   Glucose-Capillary 100 (H) 70 - 99 mg/dL    Comment: Glucose reference range applies only to samples taken after fasting for at least 8 hours.   Comment 1 Repeat Test    Comment 2 Call MD NNP PA CNM    Comment 3 Document in Chart     Recent Results (from the past 240 hour(s))  Blood culture (routine x 2)     Status: None (Preliminary result)   Collection Time: 12/17/21  3:23 PM   Specimen: Right Antecubital; Blood  Result Value Ref Range Status   Specimen Description RIGHT ANTECUBITAL  Final   Special Requests   Final    BOTTLES DRAWN AEROBIC AND ANAEROBIC Blood Culture adequate volume   Culture   Final    NO GROWTH < 24 HOURS Performed at [facility], [address] [place], [place] [address]    Report Status PENDING  Incomplete  Blood culture (routine x 2)     Status: None (Preliminary result)   Collection Time: 12/17/21  5:49 PM   Specimen: BLOOD LEFT ARM  Result Value Ref Range Status   Specimen Description BLOOD LEFT ARM  Final   Special Requests   Final    BOTTLES DRAWN AEROBIC ONLY Blood Culture results may not be optimal due to  an inadequate volume of blood received in culture bottles   Culture   Final    NO GROWTH < 12 HOURS Performed at [facility], [place] [place], [place] [address]    Report Status PENDING  Incomplete  MRSA Next Gen by PCR, Nasal     Status: Abnormal   Collection Time: 12/17/21  8:35 PM   Specimen: Nasal Mucosa;  Nasal Swab  Result Value Ref Range Status   MRSA by PCR Next Gen DETECTED (A) NOT DETECTED Final    Comment: CRITICAL RESULT CALLED TO, READ BACK BY AND VERIFIED WITH:  C/ EMILY B., RN 12/18/21 0050 A. LAFR[place] (NOTE) The GeneXpert MRSA Assay (FDA approved for NASAL specimens only), is one component of a comprehensive MRSA colonization surveillance program. It is not intended to diagnose MRSA infection nor to guide or monitor treatment for MRSA infections. Test performance is not FDA approved in patients less than 29 years old. Performed at [facility], [place] [address], [place], [place] [address]   Culture, Respiratory w Gram Stain (tracheal aspirate)     Status: None (Preliminary result)   Collection Time: 12/17/21  8:49 PM   Specimen: Tracheal Aspirate; Respiratory  Result Value Ref Range Status   Specimen Description TRACHEAL ASPIRATE  Final   Special Requests NONE  Final   Gram Stain   Final    FEW WBC PRESENT, PREDOMINANTLY PMN FEW GRAM POSITIVE COCCI IN PAIRS IN CHAINS Performed at [facility], [address] [place], [place] [address]    Culture PENDING  Incomplete   Report Status PENDING  Incomplete    Lipid Panel No results for input(s): "CHOL", "TRIG", "HDL", "CHOLHDL", "VLDL", "LDLCALC" in the last 72 hours.  Studies/Results: Rapid EEG  Result Date: 12/18/2021 [name], MD     12/18/2021  9:13 AM Patient Name: Zoeya Gramajo MRN: [id number] Epilepsy Attending: [name] Referring Physician/Provider: Amie [place], MD Duration: 12/17/2021 2045 to 12/18/2021 0626 Patient history: 81 y.o. female past history of CKD  3, COPD, diabetes, hypertension, seizures on Keppra and Vimpat at home, prior strokes-2019 also received tPA, frequent UTIs, presenting with unresponsiveness and possible seizure. EEG to evaluate for seizure Level of alertness: lethargic/sedated AEDs during EEG study: LEV, LCM, Versed Technical aspects: This EEG was obtained using a 10 lead EEG system positioned circumferentially without any parasagittal coverage (rapid EEG). Computer selected EEG is reviewed as  well as background features and all clinically significant events. Description: EEG showed continuous generalized 3 to 6 Hz theta-delta slowing admixed with 12-14hz  generalized beta activity.Hyperventilation and photic stimulation were not performed.   ABNORMALITY - Continuous slow, generalized IMPRESSION: This limited ceribell EEG is suggestive of severe diffuse encephalopathy, nonspecific etiology. No seizures or epileptiform discharges were seen throughout the recording. [name]   DG Chest Port 1 View  Result Date: 12/17/2021 CLINICAL DATA:  Endotracheal tube in place. EXAM: PORTABLE CHEST 1 VIEW COMPARISON:  Radiograph earlier today.  Chest CT 12/20/2018 FINDINGS: The endotracheal tube is been retracted, the tip is 3.2 cm from the carina. Enteric tube in place with tip below the diaphragm. The patient is rotated. Stable heart size. There is aortic atherosclerosis and tortuosity. Apparent rounded density inferior to the transverse aorta is likely stable and accentuated by rotation. Scarring at the right lung base. Similar atelectasis in the left mid lower lung zone. No pneumothorax or pleural effusion. IMPRESSION: 1. Endotracheal tube tip 3.2 cm from the carina. Enteric tube in place with tip below the diaphragm. 2. Similar atelectasis in the left mid lower lung zone. Electronically Signed   By: Keith Rake M.D.   On: 12/17/2021 21:22   DG Chest Portable 1 View  Result Date: 12/17/2021 CLINICAL DATA:  Intubated. EXAM: PORTABLE CHEST  1 VIEW COMPARISON:  09/08/2021 FINDINGS: Stable borderline enlarged cardiac silhouette. Tortuous and calcified thoracic aorta. Interval endotracheal tube with its  tip at the carina. Stable mild prominence of the interstitial markings with normal vascularity. Small amount of linear atelectasis in the left mid to lower lung zone. The remainder of the lungs are clear. Mild thoracic spine degenerative changes. Nasogastric tube extending into the stomach with its tip in the proximal stomach. IMPRESSION: 1. Interval endotracheal tube with its tip at the carina. This should be retracted 5 cm. 2. Small amount of linear atelectasis in the left mid to lower lung zone. Electronically Signed   By: Claudie Revering M.D.   On: 12/17/2021 17:07   CT Head Wo Contrast  Result Date: 12/17/2021 CLINICAL DATA:  Loss of consciousness.  Delirium. EXAM: CT HEAD WITHOUT CONTRAST TECHNIQUE: Contiguous axial images were obtained from the base of the skull through the vertex without intravenous contrast. RADIATION DOSE REDUCTION: This exam was performed according to the departmental dose-optimization program which includes automated exposure control, adjustment of the mA and/or kV according to patient size and/or use of iterative reconstruction technique. COMPARISON:  09/08/2021 FINDINGS: Brain: No evidence of intracranial hemorrhage, acute infarction, hydrocephalus, extra-axial collection, or mass lesion/mass effect. Mild generalized cerebral atrophy remains stable. Vascular:  No hyperdense vessel or other acute findings. Skull: No evidence of fracture or other significant bone abnormality. Sinuses/Orbits: Increased mucosal thickening involving the ethmoid sinuses. Other: None. IMPRESSION: No acute intracranial abnormality.  Mild cerebral atrophy. Increased mucosal thickening involving the ethmoid sinuses. Electronically Signed   By: Marlaine Hind M.D.   On: 12/17/2021 16:11    Medications: Scheduled:  arformoterol  15 mcg Nebulization BID    budesonide (PULMICORT) nebulizer solution  0.25 mg Nebulization BID   Chlorhexidine Gluconate Cloth  6 each Topical Daily   docusate  100 mg Per Tube BID   famotidine  20 mg Per Tube BID   heparin  5,000 Units Subcutaneous Q8H   hydroxychloroquine  200 mg Per Tube Daily   insulin aspart  0-9 Units Subcutaneous Q4H   mouth rinse  15 mL Mouth Rinse Q2H   polyethylene glycol  17 g Per Tube Daily   revefenacin  175 mcg Nebulization Daily   sertraline  50 mg Per Tube Daily   Continuous:  cefTRIAXone (ROCEPHIN)  IV 2 g (12/17/21 2230)   dexmedetomidine (PRECEDEX) IV infusion     lacosamide (VIMPAT) IV 100 mg (12/17/21 2342)   levETIRAcetam 1,000 mg (12/17/21 2200)   midazolam 5 mg/hr (12/18/21 0600)    Assessment: 81 year old female with a history of seizures (on Keppra and Vimpat at home), prior strokes, CKD3, DM, HTN, COPD and frequent UTIs, who presented to [facility] with recurrent spells of unresponsiveness concerning for seizures. She was noticed to have seizure-like activity at home for which EMS was called. After the last spell at [facility], she had difficulty breathing requiring emergent intubation. She was loaded with Keppra and Vimpat at the outside hospital prior to transporting to [facility]. She was hooked up to rapid EEG on arrival and preliminary review was negative for status epilepticus.  Also has a possible UTI, which may have lowered seizure threshold - Exam this AM reveals minimal eye opening to noxious, intact corneals, semipurposeful and weak localizing movements to noxious, prominent BUE tremor induced by stimulation, prominent jaw clonus induced by stimulation and hyperactive reflexes x 4 that are worse on the left.  - Ceribell EEG:  Continuous slow, generalized. This limited ceribell EEG is suggestive of severe diffuse encephalopathy, nonspecific to etiology. No seizures or epileptiform discharges were seen throughout the recording. -  Overall impression: Status  epilepticus, now resolved. Worsening seizure frequency likely due to lowering of seizure threshold in the setting of UTI and other metabolic derangements   Recommendations: - Appreciate PCCM care of the patient. - Vent management per PCCM - Decreasing Keppra from 1000 mg BID to 500 mg BID due to low CrCl.  - Continue Vimpat 100 mg BID - Hooking up to LTM EEG - Versed gtt, titrate per CCM requirements but may also need to increase if LTM EEG shows recurrence of status epilepticus.  - Maintain seizure precautions - Treatment of toxic metabolic derangements per primary team as you are - Neurology will continue to follow the patient with you.    35 minutes spent in the neurological evaluation and management of this critically ill patient.   Addendum: LTM EEG reviewed. Occasional phase reversals seen. No definite electrographic seizures appreciated.    LOS: 1 day   @Electronically  signed: Dr. Kerney Elbe 12/18/2021  9:57 AM

## 2021-12-18 NOTE — Progress Notes (Signed)
NAMEBeckey [name], MRN:  [id number], DOB:  10/19/1940, LOS: 1 ADMISSION DATE:  12/17/2021, CONSULTATION DATE:  11/4 REFERRING MD:  Dr. Leonel Ramsay, CHIEF COMPLAINT:  seizure   History of Present Illness:  81 year old female followed by [facility] as out pt (still full code). Resides at [facility]. EMS called 11/4 when pt found unresponsive. Had witnessed seizure by EMS for which she was treated w/ IV midazolam. After this episode she was interactive and able to converse. Shortly after had another episode where she lost LOC. Required BMV. Intubated on arrival to ER at [facility]. Seen by Neuro. Transferred to [facility] for LTM.   Pertinent  Medical History  CKD 3b, prior seizure, chronic CHF, angioedema, cerebral atrophy, seizures, COPD, DM tyoe 2, GERD, LVH Followed by palliative care, obesity  Significant Hospital Events: Including procedures, antibiotic start and stop dates in addition to other pertinent events   11/4: admitted to [facility] ED for seizure; intubated; transferring to [facility] for ceeg  Interim History / Subjective:   Intubated on mech vent Sedated on versed drip Neuro at bedside  Objective   Blood pressure (!) 164/75, pulse 61, temperature 98.8 F (37.1 C), temperature source Axillary, resp. rate 20, height 5\' 1"  (1.549 m), weight 103.3 kg, SpO2 98 %.    Vent Mode: PRVC FiO2 (%):  [40 %-60 %] 40 % Set Rate:  [18 bmp-20 bmp] 20 bmp Vt Set:  [18 mL-380 mL] 380 mL PEEP:  [5 cmH20] 5 cmH20 Plateau Pressure:  [18 cmH20-28 cmH20] 18 cmH20   Intake/Output Summary (Last 24 hours) at 12/18/2021 0932 Last data filed at 12/18/2021 0600 Gross per 24 hour  Intake 41.02 ml  Output 725 ml  Net -683.98 ml   Filed Weights   12/17/21 2035 12/18/21 0500  Weight: 106.5 kg 103.3 kg    Examination: General appearance: 81 y.o., female, intubatead HENT: NCAT; MMM Lungs: mech breath sounds bl, equal chest rise CV: RRR, no murmur  Abdomen: Soft, non-tender; non-distended, BS present  Extremities: trace  peripheral edema, warm Neuro: deeply sedated  Labs reviewed No new bmp, cbc available yet  No new imaging  Resolved Hospital Problem list     Assessment & Plan:  Status Epilepticus Hx of seizures on Keppra and lacosamide at home Hx of Cerebral atrophy: baseline? Acute encephalopathy -CT head negative for acute abnormality P: -EEG, AEDs per neuro -wean off versed gtt, continue prn fentanyl, start precedex for rass -1 -As needed Versed for seizure -Frequent neuro checks -limit sedating meds -check ammonia, uds, etoh -abx for UTI; follow UC -Continue neuroprotective measures- normothermia, euglycemia, normocapnia, normoxia.  -Palliative care following outpt; consider palliative consult pending course  Acute respiratory failure due to above COPD P: -LTVV strategy with tidal volumes of 6-8 cc/kg ideal body weight -Wean PEEP/FiO2 for SpO2 >92% -pulmicort, brovana, yupelri -prn albuterol for wheezing -VAP bundle in place -Daily SAT and SBT -PAD protocol in place -wean sedation for RASS goal 0 to -1  UTI: UA with leukocytes and bacteria P: -rocephin -follow bcx2, UC, tracheal aspirate  Hyperkalemia CKD 3b P: -trend bmet -avoid nephrotoxins  Rheumatoid Arthritis: on plaquenil P: -home plaquenil  Anemia of chronic disease P: -trend cbc  Chronic CHF  LVH P: -Daily weights/strict I's and O's -hold home propanolol  DM type 2 P: -check a1c -ssi and cbg monitoring  GERD P: -h2 blocker  Depression P: -resume sertraline   Best Practice (right click and "Reselect all SmartList Selections" daily)   Diet/type: NPO w/  meds via tube - consider TF if not extubating today DVT prophylaxis: prophylactic heparin  GI prophylaxis: H2B Lines: N/A Foley:  Yes, and it is still needed Code Status:  full code Last date of multidisciplinary goals of care discussion [11/4 spoke with husband over phone. He states that if her heart were to stop to not do anything and  would like patient to be DNR. States that he will be at bedside 11/5.]  Critical care time: 35 minutes    [place]  12/18/2021, 9:32 AM  Please see [email or web address] for pager details.  From 7A-7P if no response, please call [phone]. After hours, please call ELink [phone].

## 2021-12-19 ENCOUNTER — Inpatient Hospital Stay: Payer: Self-pay

## 2021-12-19 ENCOUNTER — Inpatient Hospital Stay (HOSPITAL_COMMUNITY): Payer: Medicare HMO

## 2021-12-19 DIAGNOSIS — R569 Unspecified convulsions: Secondary | ICD-10-CM | POA: Diagnosis not present

## 2021-12-19 LAB — CBC
HCT: 33.3 % — ABNORMAL LOW (ref 36.0–46.0)
HCT: 35.5 % — ABNORMAL LOW (ref 36.0–46.0)
Hemoglobin: 10.6 g/dL — ABNORMAL LOW (ref 12.0–15.0)
Hemoglobin: 11.7 g/dL — ABNORMAL LOW (ref 12.0–15.0)
MCH: 29.3 pg (ref 26.0–34.0)
MCH: 30.1 pg (ref 26.0–34.0)
MCHC: 31.8 g/dL (ref 30.0–36.0)
MCHC: 33 g/dL (ref 30.0–36.0)
MCV: 92 fL (ref 80.0–100.0)
MCV: 91.3 fL (ref 80.0–100.0)
Platelets: 157 10*3/uL (ref 150–400)
Platelets: 148 10*3/uL — ABNORMAL LOW (ref 150–400)
RBC: 3.62 MIL/uL — ABNORMAL LOW (ref 3.87–5.11)
RBC: 3.89 MIL/uL (ref 3.87–5.11)
RDW: 13.2 % (ref 11.5–15.5)
RDW: 13.5 % (ref 11.5–15.5)
WBC: 7.9 10*3/uL (ref 4.0–10.5)
WBC: 7.6 10*3/uL (ref 4.0–10.5)
nRBC: 0 % (ref 0.0–0.2)
nRBC: 0 % (ref 0.0–0.2)

## 2021-12-19 LAB — BASIC METABOLIC PANEL
Anion gap: 12 (ref 5–15)
Anion gap: 13 (ref 5–15)
BUN: 26 mg/dL — ABNORMAL HIGH (ref 8–23)
BUN: 26 mg/dL — ABNORMAL HIGH (ref 8–23)
CO2: 26 mmol/L (ref 22–32)
CO2: 24 mmol/L (ref 22–32)
Calcium: 8.8 mg/dL — ABNORMAL LOW (ref 8.9–10.3)
Calcium: 8.7 mg/dL — ABNORMAL LOW (ref 8.9–10.3)
Chloride: 106 mmol/L (ref 98–111)
Chloride: 105 mmol/L (ref 98–111)
Creatinine, Ser: 1.67 mg/dL — ABNORMAL HIGH (ref 0.44–1.00)
Creatinine, Ser: 1.54 mg/dL — ABNORMAL HIGH (ref 0.44–1.00)
GFR, Estimated: 31 mL/min — ABNORMAL LOW (ref >60)
GFR, Estimated: 34 mL/min — ABNORMAL LOW (ref >60)
Glucose, Bld: 133 mg/dL — ABNORMAL HIGH (ref 70–99)
Glucose, Bld: 115 mg/dL — ABNORMAL HIGH (ref 70–99)
Potassium: 4 mmol/L (ref 3.5–5.1)
Potassium: 3.7 mmol/L (ref 3.5–5.1)
Sodium: 144 mmol/L (ref 135–145)
Sodium: 142 mmol/L (ref 135–145)

## 2021-12-19 LAB — LACTIC ACID, PLASMA
Lactic Acid, Venous: 4.5 mmol/L (ref 0.5–1.9)
Lactic Acid, Venous: 1.9 mmol/L (ref 0.5–1.9)
Lactic Acid, Venous: 1.1 mmol/L (ref 0.5–1.9)

## 2021-12-19 LAB — ETHANOL: Alcohol, Ethyl (B): <10 mg/dL (ref <10)

## 2021-12-19 LAB — HEMOGLOBIN A1C
Hgb A1c MFr Bld: 5.4 % (ref 4.8–5.6)
Mean Plasma Glucose: 108.28 mg/dL

## 2021-12-19 LAB — GLUCOSE, CAPILLARY
Glucose-Capillary: 127 mg/dL — ABNORMAL HIGH (ref 70–99)
Glucose-Capillary: 120 mg/dL — ABNORMAL HIGH (ref 70–99)
Glucose-Capillary: 113 mg/dL — ABNORMAL HIGH (ref 70–99)
Glucose-Capillary: 130 mg/dL — ABNORMAL HIGH (ref 70–99)
Glucose-Capillary: 108 mg/dL — ABNORMAL HIGH (ref 70–99)
Glucose-Capillary: 141 mg/dL — ABNORMAL HIGH (ref 70–99)

## 2021-12-19 LAB — PHOSPHORUS
Phosphorus: 2.6 mg/dL (ref 2.5–4.6)
Phosphorus: 2.6 mg/dL (ref 2.5–4.6)
Phosphorus: 2.5 mg/dL (ref 2.5–4.6)

## 2021-12-19 LAB — MAGNESIUM
Magnesium: 1.5 mg/dL — ABNORMAL LOW (ref 1.7–2.4)
Magnesium: 1.7 mg/dL (ref 1.7–2.4)
Magnesium: 2.6 mg/dL — ABNORMAL HIGH (ref 1.7–2.4)

## 2021-12-19 LAB — PROCALCITONIN: Procalcitonin: <0.1 ng/mL

## 2021-12-19 MED ORDER — POLYETHYLENE GLYCOL 3350 17 G PO PACK: 17.0000 g | PACK | Freq: Two times a day (BID) | ORAL | Status: DC

## 2021-12-19 MED ORDER — SODIUM CHLORIDE 0.9% FLUSH
10.0000 mL | Freq: Two times a day (BID) | INTRAVENOUS | Status: DC
  Administered 2021-12-19: 10 mL
  Administered 2021-12-20 – 2021-12-21 (×2): 20 mL
  Administered 2021-12-22 – 2021-12-23 (×4): 10 mL
  Administered 2021-12-24: 20 mL
  Administered 2021-12-24 – 2021-12-25 (×3): 10 mL
  Administered 2021-12-26: 20 mL

## 2021-12-19 MED ORDER — POTASSIUM CHLORIDE 20 MEQ PO PACK
20.0000 meq | PACK | Freq: Once | ORAL | Status: AC
  Administered 2021-12-19: 20 meq
  Filled 2021-12-19: qty 1

## 2021-12-19 MED ORDER — SENNOSIDES 8.8 MG/5ML PO SYRP
5.0000 mL | ORAL_SOLUTION | Freq: Every day | ORAL | Status: DC
  Administered 2021-12-20: 5 mL
  Filled 2021-12-19 (×2): qty 5

## 2021-12-19 MED ORDER — SODIUM CHLORIDE 0.9% FLUSH: 10.0000 mL | INTRAVENOUS | Status: DC | PRN

## 2021-12-19 MED ORDER — SODIUM CHLORIDE 0.9 % IV BOLUS
500.0000 mL | Freq: Once | INTRAVENOUS | Status: AC
  Administered 2021-12-19: 500 mL via INTRAVENOUS

## 2021-12-19 MED ORDER — PEPTAMEN 1.5 CAL PO LIQD
1000.0000 mL | ORAL | Status: DC
  Administered 2021-12-19: 1000 mL
  Filled 2021-12-19 (×2): qty 1000

## 2021-12-19 MED ORDER — BUDESONIDE 0.5 MG/2ML IN SUSP
0.5000 mg | Freq: Two times a day (BID) | RESPIRATORY_TRACT | Status: DC
  Administered 2021-12-19 – 2021-12-26 (×12): 0.5 mg via RESPIRATORY_TRACT
  Filled 2021-12-19 (×13): qty 2

## 2021-12-19 MED ORDER — PROSOURCE TF20 ENFIT COMPATIBL EN LIQD
60.0000 mL | Freq: Two times a day (BID) | ENTERAL | Status: DC
  Administered 2021-12-19 – 2021-12-20 (×3): 60 mL
  Filled 2021-12-19 (×3): qty 60

## 2021-12-19 MED ORDER — MAGNESIUM SULFATE 4 GM/100ML IV SOLN
4.0000 g | Freq: Once | INTRAVENOUS | Status: AC
  Administered 2021-12-19: 4 g via INTRAVENOUS
  Filled 2021-12-19: qty 100

## 2021-12-19 NOTE — Progress Notes (Signed)
NAMEKalima [name], MRN:  [id number], DOB:  1940/06/30, LOS: 2 ADMISSION DATE:  12/17/2021, CONSULTATION DATE:  11/4 REFERRING MD:  Dr. Leonel Ramsay, CHIEF COMPLAINT:  seizure   History of Present Illness:  81 year old female followed by Palliative Care as out pt (still full code). Resides at [facility]. EMS called 11/4 when pt found unresponsive. Had witnessed seizure by EMS for which she was treated w/ IV midazolam. After this episode she was interactive and able to converse. Shortly after had another episode where she lost LOC. Required BMV. Intubated on arrival to ER at [facility]. Seen by Neuro. Transferred to [facility] for LTM.   Pertinent  Medical History  CKD 3b, prior seizure, chronic CHF, angioedema, cerebral atrophy, seizures, COPD, DM tyoe 2, GERD, LVH Followed by palliative care, obesity  Significant Hospital Events: Including procedures, antibiotic start and stop dates in addition to other pertinent events   11/4 Admitted to [facility] ED for seizure; intubated; transferring to [facility] for cEEG 11/5 On vent, sedated on versed  Interim History / Subjective:  Tmax 99.5  BC pending  PEEP 5 / FiO2 40% UOP 1.6L, -860ml in last 24 hours RN reports versed turned off this am, issue with EEG overnight (off), IV access limited  Objective   Blood pressure (!) 151/80, pulse 82, temperature 99.5 F (37.5 C), resp. rate (!) 22, height 5\' 1"  (1.549 m), weight 106.1 kg, SpO2 95 %.    Vent Mode: PRVC FiO2 (%):  [40 %] 40 % Set Rate:  [20 bmp] 20 bmp Vt Set:  [380 mL] 380 mL PEEP:  [5 cmH20] 5 cmH20 Plateau Pressure:  [17 cmH20-19 cmH20] 18 cmH20   Intake/Output Summary (Last 24 hours) at 12/19/2021 0730 Last data filed at 12/19/2021 0700 Gross per 24 hour  Intake 1435.27 ml  Output 1600 ml  Net -164.73 ml   Filed Weights   12/17/21 2035 12/18/21 0500 12/19/21 0253  Weight: 106.5 kg 103.3 kg 106.1 kg    Examination: General: elderly female lying in bed on vent in NAD HEENT: MM pink/moist, ETT,  anicteric, pupils equal Neuro: sedate, turns toward painful stimuli & opens eyes  CV: s1s2 RRR, no m/r/g PULM: non-labored at rest on vent, lungs bilaterally clear  GI: soft, bsx4 active  Extremities: warm/dry, 1+ generalized  edema  Skin: no rashes or lesions  Resolved Hospital Problem list     Assessment & Plan:   Status Epilepticus Hx of seizures on Keppra and lacosamide PTA Hx of Cerebral atrophy: baseline? Acute encephalopathy CT head negative for acute abnormality. Ammonia 40, UDS pending, ETOH negative.  -EEG, AED's per Neurology  -await clearance from Neuro for SBT  -versed for seizure -neuro protective measures  -await ammonia  -palliative care following as outpatient, pending clinical course, may need input   Acute Respiratory Failure due to Status Epilepticus COPD, not in exacerbation  -PRVC with LTVV  -reduce rate to 16 -wean PEEP / fiO2 for sats >92% -assess CXR post transfer -VAP prevention measures  -Daily SBT / WUA   UTI UA with leukocytes and bacteria -continue rocephin, D3/x.  Consider 3-5 days, then stop -follow up culture   -PCT negative  Hyperkalemia CKD 3b -Trend BMP / urinary output -Replace electrolytes as indicated -Avoid nephrotoxic agents, ensure adequate renal perfusion -OK for PICC, age, SNF residence, palliative care prior to admit  Rheumatoid Arthritis On plaquenil -continue plaquenil   Anemia of Chronic Disease -follow CBC -transfuse for Hgb <7%  Chronic CHF  LVH -follow  daily weight, I/O's  -hold home propanolol   DM Type 2 Hgb A1c 5.4 on admit  -SSI, sensitive scale   GERD -H2 blocker   Depression -continue sertraline    Best Practice (right click and "Reselect all SmartList Selections" daily)  Diet/type: tubefeeds and NPO w/ meds via tube   DVT prophylaxis: prophylactic heparin  GI prophylaxis: H2B Lines: N/A Foley:  Yes, and it is still needed Code Status:  full code Last date of multidisciplinary goals  of care discussion: 11/4 spoke with husband over phone. He states that if her heart were to stop to not do anything and would like patient to be DNR. States that he will be at bedside 11/5.  Critical care time: 32 minutes     Noe Gens, MSN, APRN, NP-C, AGACNP-BC [facility] 12/19/2021, 7:39 AM   Please see [email or web address] for pager details.   From 7A-7P if no response, please call [phone] After hours, please call ELink [phone]

## 2021-12-19 NOTE — Progress Notes (Signed)
  Transition of Care [facility]) Screening Note   Patient Details  Name: Katherine [name] Date of Birth: Nov 06, 1940   Transition of Care [facility]) CM/SW Contact:    Benard Halsted, LCSW Phone Number: 12/19/2021, 11:47 AM    Transition of Care Department [facility]) has reviewed patient and she resides under long term care at [facility]. We will continue to monitor patient advancement through interdisciplinary progression rounds. If new patient transition needs arise, please place a TOC consult.

## 2021-12-19 NOTE — Progress Notes (Signed)
Subjective: No seizures overnight.  Waking up but not following commands yet.  ROS: Unable to obtain due to poor mental status  Examination  Vital signs in last 24 hours: Temp:  [98.9 F (37.2 C)-100.8 F (38.2 C)] 99.9 F (37.7 C) (11/06 1100) Pulse Rate:  [62-97] 94 (11/06 1127) Resp:  [17-22] 18 (11/06 1127) BP: (121-180)/(69-114) 141/93 (11/06 1127) SpO2:  [92 %-96 %] 95 % (11/06 1127) FiO2 (%):  [40 %] 40 % (11/06 1127) Weight:  [106.1 kg] 106.1 kg (11/06 0253)  General: lying in bed, intubated Neuro: On Versed at 2 mg/h, opens eyes to tactile stimulation and looks at examiner but does not follow commands, PERRLA, right eye appears droopy but not sure if this is ptosis versus just effect of sedation, reduction of to stimuli in all 4 extremities  Basic Metabolic Panel: Recent Labs  Lab 12/17/21 1547 12/17/21 2158 12/18/21 1141 12/19/21 0115 12/19/21 0514  NA 141 140 139 144 142  K 5.7* 4.4 4.4 4.0 3.7  CL 100  --  103 106 105  CO2 36*  --  27 26 24   GLUCOSE 121*  --  103* 133* 115*  BUN 21  --  15 26* 26*  CREATININE 1.77*  --  1.63* 1.67* 1.54*  CALCIUM 8.7*  --  8.6* 8.8* 8.7*  MG  --   --  1.7 1.5* 1.7  PHOS  --   --  2.0* 2.6 2.6    CBC: Recent Labs  Lab 12/17/21 1547 12/17/21 2158 12/18/21 1141 12/19/21 0115 12/19/21 0514  WBC 5.5  --  7.9 7.9 7.6  NEUTROABS 3.1  --   --   --   --   HGB 11.0* 11.2* 11.4* 10.6* 11.7*  HCT 35.7* 33.0* 35.2* 33.3* 35.5*  MCV 94.7  --  91.9 92.0 91.3  PLT 167  --  143* 157 148*   Coagulation Studies: No results for input(s): "LABPROT", "INR" in the last 72 hours.  Imaging CT head without contrast 12/17/2021. No acute intracranial abnormality.  Mild cerebral atrophy. Increased mucosal thickening involving the ethmoid sinuses.  ASSESSMENT AND PLAN: 81 year old female with history of seizures on Keppra and Vimpat at home, prior strokes, hypertension, diabetes, CKD 3 who presented with recurrence spells of  unresponsiveness.  Concern for seizure recurrence in the setting of UTI.  Epilepsy with breakthrough seizure UTI Hyperammonemia -Suspected breakthrough seizure in the setting of UTI  Recommendation -Okay to stop Versed - Home AEDs: LEV 500mg  BID, LCM 100mg  BID, continuing same dose right now -Of note, patient is also on gabapentin 200 mg at bedtime.  We can resume this most likely tomorrow now that sedation is weaned off  -If any further seizures, can increase Vimpat to 150 mg twice daily -Continue EEG overnight, will likely discontinue tomorrow if no further seizures -Continue seizure precautions -As needed IV Ativan 2 mg for clinical seizure-like activity  I have spent a total of  36 minutes with the patient reviewing hospital notes,  test results, labs and examining the patient as well as establishing an assessment and plan.  > 50% of time was spent in direct patient care.    Zeb Comfort Epilepsy [facility] For questions after 5pm please refer to AMION to reach the Neurologist on call

## 2021-12-19 NOTE — Progress Notes (Signed)
[facility] ADULT ICU REPLACEMENT PROTOCOL   The patient does apply for the [facility] Adult ICU Electrolyte Replacment Protocol based on the criteria listed below:   1.Exclusion criteria: TCTS, ECMO, Dialysis, and Myasthenia Gravis patients 2. Is GFR >/= 30 ml/min? Yes.    Patient's GFR today is 31 3. Is SCr </= 2? Yes.   Patient's SCr is 1.67 mg/dL 4. Did SCr increase >/= 0.5 in 24 hours? No. 5.Pt's weight >40kg  Yes.   6. Abnormal electrolyte(s): Mag  7. Electrolytes replaced per protocol 8.  Call MD STAT for K+ </= 2.5, Phos </= 1, or Mag </= 1 Physician:  Dani Gobble E Ezekiah Massie 12/19/2021 3:04 AM

## 2021-12-19 NOTE — Procedures (Signed)
Patient Name: Katherine [name]  MRN: [id number]  Epilepsy Attending: [name]  Referring Physician/Provider: Kerney Elbe, MD  Duration: 12/18/2021 7544 to 12/18/2021 2017, 12/19/2021 0821 to 12/19/2021 1800  Patient history: 81 year old female with a history of seizures (on Keppra and Vimpat at home), prior strokes, CKD3, DM, HTN, COPD and frequent UTIs, with recurrent spells of unresponsiveness concerning for seizures. EEG to evaluate for seizure.  Level of alertness:  lethargic /sedated  AEDs during EEG study: LEV, LCM, Versed   Technical aspects: This EEG study was done with scalp electrodes positioned according to the 10-20 International system of electrode placement. Electrical activity was reviewed with band pass filter of 1-70Hz , sensitivity of 7 uV/mm, display speed of 74mm/sec with a 60Hz  notched filter applied as appropriate. EEG data were recorded continuously and digitally stored.  Video monitoring was available and reviewed as appropriate.  Description: EEG showed continuous generalized 3 to 6 Hz theta-delta slowing admixed with 12-14hz  generalized beta activity. Hyperventilation and photic stimulation were not performed.   Event button was pressed on 12/18/2021 at 1037 for lower lip quivering.  Concomitant EEG before, during and after the event did not show any EEG changes suggest seizure.  Study was disconnected between 12/18/2021 2017 to 12/19/2021 9201 due to technical issues.  ABNORMALITY - Continuous slow, generalized  IMPRESSION: This study is suggestive of severe diffuse encephalopathy, nonspecific etiology but could be related to sedation. No seizures or epileptiform discharges were seen throughout the recording.  [name]

## 2021-12-19 NOTE — Progress Notes (Signed)
Peripherally Inserted Central Catheter Placement  The IV Nurse has discussed with the patient and/or persons authorized to consent for the patient, the purpose of this procedure and the potential benefits and risks involved with this procedure.  The benefits include less needle sticks, lab draws from the catheter, and the patient may be discharged home with the catheter. Risks include, but not limited to, infection, bleeding, blood clot (thrombus formation), and puncture of an artery; nerve damage and irregular heartbeat and possibility to perform a PICC exchange if needed/ordered by physician.  Alternatives to this procedure were also discussed.  Bard Power PICC patient education guide, fact sheet on infection prevention and patient information card has been provided to patient /or left at bedside.  Telephone consent obtained from husband due to altered mental status.  PICC inserted by Aldona Lento, RN   PICC Placement Documentation  PICC Double Lumen 12/19/21 Right Brachial 39 cm 0 cm (Active)  Indication for Insertion or Continuance of Line Poor Vasculature-patient has had multiple peripheral attempts or PIVs lasting less than 24 hours;Limited venous access - need for IV therapy >5 days (PICC only);Prolonged intravenous therapies 12/19/21 2136  Exposed Catheter (cm) 0 cm 12/19/21 2136  Site Assessment Clean, Dry, Intact 12/19/21 2136  Lumen #1 Status Flushed;Saline locked;Blood return noted 12/19/21 2136  Lumen #2 Status Flushed;Saline locked;Blood return noted 12/19/21 2136  Dressing Type Transparent;Securing device 12/19/21 2136  Dressing Status Antimicrobial disc in place;Clean, Dry, Intact 12/19/21 2136  Safety Lock Not Applicable 75/17/00 1749  Line Care Connections checked and tightened 12/19/21 2136  Line Adjustment (NICU/IV Team Only) No 12/19/21 2136  Dressing Intervention New dressing 12/19/21 2136  Dressing Change Due 12/26/21 12/19/21 2136       Izell Labat, Nicolette Bang 12/19/2021, 9:36 PM

## 2021-12-19 NOTE — Progress Notes (Signed)
Initial Nutrition Assessment  DOCUMENTATION CODES:   Not applicable  INTERVENTION:   Adjust tube feeding via OG tube to: Peptamen at 35 ml/h (840 ml per day)  Prosource TF20 60 ml BID   Provides 1420 kcal, 97 gm protein, 645 ml free water daily    NUTRITION DIAGNOSIS:   Inadequate oral intake related to inability to eat as evidenced by NPO status.  GOAL:   Patient will meet greater than or equal to 90% of their needs  MONITOR:   TF tolerance, Vent status  REASON FOR ASSESSMENT:   Consult Enteral/tube feeding initiation and management  ASSESSMENT:   81 y.o. female with PMH CKD3, CHF, COPD, T2DM, and GERD who resides at SNF presented after being found unresponsive with witnessed seizure by EMS.  11/4: arrived intubated 11/5 TFs of Vital High Protein started at 37mL/hr  Patient intubated and in restraints at time of visit. No family present at bedside. RN reports patient tolerating tube feeds well. Weight stable the past 3 months PTA per chart review.    Medications reviewed and include: Versed, Colace, Prosource x1, Vital HP, Insulin, Miralax  Labs reviewed:  Creatinine 1.54 (H) GFR 34 (L) HA1C 5.4 Blood Glucose 78-127 x24 hours   NUTRITION - FOCUSED PHYSICAL EXAM:  Flowsheet Row Most Recent Value  Orbital Region No depletion  Upper Arm Region No depletion  Thoracic and Lumbar Region No depletion  Buccal Region Unable to assess  Temple Region No depletion  Clavicle Bone Region No depletion  Clavicle and Acromion Bone Region No depletion  Scapular Bone Region Unable to assess  Dorsal Hand No depletion  Patellar Region No depletion  Anterior Thigh Region No depletion  Posterior Calf Region No depletion  Edema (RD Assessment) Moderate  Hair Reviewed  Eyes Reviewed  Mouth Reviewed  Skin Reviewed  Nails Reviewed       Diet Order:   Diet Order             Diet NPO time specified Except for: Other (See Comments)  Diet effective now                    EDUCATION NEEDS:   No education needs have been identified at this time  Skin:  Skin Assessment: Reviewed RN Assessment  Last BM:  11/6  Height:  Ht Readings from Last 1 Encounters:  12/17/21 5\' 1"  (1.549 m)   Weight:  Wt Readings from Last 1 Encounters:  12/19/21 106.1 kg    Ideal Body Weight:  47.7 kg  BMI:  Body mass index is 44.2 kg/m.  Estimated Nutritional Needs:  Kcal:  1200-1500 Protein:  95-110 grams Fluid:  1200-1500    Samson Frederic RD, LDN For contact information, refer to [facility].

## 2021-12-19 NOTE — Progress Notes (Signed)
Order noted for PICC placement. GFR currently 31 with CKD3 per primary RN. Janann August RN notified to contact MD to enter PICC order that addresses GFR of 31, or write a separate order that states awareness and approval of PICC placement despite GFR.

## 2021-12-19 NOTE — Progress Notes (Signed)
[facility] Progress Note Patient Name: Katherine [name] DOB: 1940-09-14 MRN: [id number]   Date of Service  12/19/2021  HPI/Events of Note  Notified of lactate of 4.5, from 1.3 two days ago.  Pt has been weaned off pressors. No fever episodes noted.   eICU Interventions  Will give fluid bolus at this time.  Will continue to monitor serial lactate levels.    PT also with vascular access issues, difficulty drawing labs. Consider PICC placement by IV team in AM.      [place] 12/19/2021, 2:46 AM

## 2021-12-20 ENCOUNTER — Inpatient Hospital Stay (HOSPITAL_COMMUNITY): Payer: Medicare HMO

## 2021-12-20 ENCOUNTER — Encounter (HOSPITAL_COMMUNITY): Payer: Self-pay | Admitting: Pulmonary Disease

## 2021-12-20 DIAGNOSIS — R569 Unspecified convulsions: Secondary | ICD-10-CM | POA: Diagnosis not present

## 2021-12-20 LAB — CBC
HCT: 31.9 % — ABNORMAL LOW (ref 36.0–46.0)
Hemoglobin: 10.4 g/dL — ABNORMAL LOW (ref 12.0–15.0)
MCH: 29.8 pg (ref 26.0–34.0)
MCHC: 32.6 g/dL (ref 30.0–36.0)
MCV: 91.4 fL (ref 80.0–100.0)
Platelets: 143 K/uL — ABNORMAL LOW (ref 150–400)
RBC: 3.49 MIL/uL — ABNORMAL LOW (ref 3.87–5.11)
RDW: 13.9 % (ref 11.5–15.5)
WBC: 8.1 K/uL (ref 4.0–10.5)
nRBC: 0 % (ref 0.0–0.2)

## 2021-12-20 LAB — GLUCOSE, CAPILLARY
Glucose-Capillary: 103 mg/dL — ABNORMAL HIGH (ref 70–99)
Glucose-Capillary: 116 mg/dL — ABNORMAL HIGH (ref 70–99)
Glucose-Capillary: 115 mg/dL — ABNORMAL HIGH (ref 70–99)
Glucose-Capillary: 145 mg/dL — ABNORMAL HIGH (ref 70–99)
Glucose-Capillary: 129 mg/dL — ABNORMAL HIGH (ref 70–99)
Glucose-Capillary: 131 mg/dL — ABNORMAL HIGH (ref 70–99)

## 2021-12-20 LAB — BASIC METABOLIC PANEL WITH GFR
Anion gap: 9 (ref 5–15)
BUN: 35 mg/dL — ABNORMAL HIGH (ref 8–23)
CO2: 27 mmol/L (ref 22–32)
Calcium: 8.8 mg/dL — ABNORMAL LOW (ref 8.9–10.3)
Chloride: 106 mmol/L (ref 98–111)
Creatinine, Ser: 1.77 mg/dL — ABNORMAL HIGH (ref 0.44–1.00)
GFR, Estimated: 29 mL/min — ABNORMAL LOW
Glucose, Bld: 141 mg/dL — ABNORMAL HIGH (ref 70–99)
Potassium: 3.9 mmol/L (ref 3.5–5.1)
Sodium: 142 mmol/L (ref 135–145)

## 2021-12-20 LAB — CULTURE, RESPIRATORY W GRAM STAIN: Culture: NORMAL

## 2021-12-20 MED ORDER — VITAL 1.5 CAL PO LIQD
1000.0000 mL | ORAL | Status: DC
  Administered 2021-12-20: 1000 mL
  Filled 2021-12-20: qty 1000

## 2021-12-20 MED ORDER — POTASSIUM CHLORIDE 20 MEQ PO PACK
20.0000 meq | PACK | Freq: Once | ORAL | Status: AC
  Administered 2021-12-20: 20 meq via ORAL
  Filled 2021-12-20: qty 1

## 2021-12-20 MED ORDER — MUPIROCIN 2 % EX OINT
1.0000 | TOPICAL_OINTMENT | Freq: Two times a day (BID) | CUTANEOUS | Status: AC
  Administered 2021-12-20 – 2021-12-24 (×10): 1 via NASAL
  Filled 2021-12-20 (×4): qty 22

## 2021-12-20 MED ORDER — GABAPENTIN 250 MG/5ML PO SOLN
200.0000 mg | Freq: Every day | ORAL | Status: DC
  Administered 2021-12-20: 200 mg
  Filled 2021-12-20 (×3): qty 4

## 2021-12-20 MED ORDER — LORAZEPAM 2 MG/ML IJ SOLN
2.0000 mg | INTRAMUSCULAR | Status: DC | PRN
  Filled 2021-12-20: qty 1

## 2021-12-20 MED ORDER — FUROSEMIDE 10 MG/ML IJ SOLN
80.0000 mg | Freq: Once | INTRAMUSCULAR | Status: AC
  Administered 2021-12-20: 80 mg via INTRAVENOUS
  Filled 2021-12-20: qty 8

## 2021-12-20 NOTE — Progress Notes (Signed)
LTM EEG discontinued - no skin breakdown at [place].   

## 2021-12-20 NOTE — Progress Notes (Signed)
Subjective: No definite seizures overnight.  More awake, following commands today  ROS: Unable to obtain due to intubation  Examination  Vital signs in last 24 hours: Temp:  [99 F (37.2 C)-100.2 F (37.9 C)] 99.7 F (37.6 C) (11/07 1000) Pulse Rate:  [70-94] 74 (11/07 1000) Resp:  [11-26] 18 (11/07 1000) BP: (91-162)/(60-90) 143/82 (11/07 1000) SpO2:  [92 %-98 %] 93 % (11/07 1107) FiO2 (%):  [40 %] 40 % (11/07 1107) Weight:  [106.7 kg] 106.7 kg (11/07 0300)  General: lying in bed, intubated Neuro: Opens eyes to repeated tactile stimuli, able to follow simple one-step commands, PERRLA, EOMI, antigravity strength in all 4 extremities  Basic Metabolic Panel: Recent Labs  Lab 12/17/21 1547 12/17/21 2158 12/18/21 1141 12/19/21 0115 12/19/21 0514 12/19/21 1710 12/20/21 0439  NA 141 140 139 144 142  --  142  K 5.7* 4.4 4.4 4.0 3.7  --  3.9  CL 100  --  103 106 105  --  106  CO2 36*  --  27 26 24   --  27  GLUCOSE 121*  --  103* 133* 115*  --  141*  BUN 21  --  15 26* 26*  --  35*  CREATININE 1.77*  --  1.63* 1.67* 1.54*  --  1.77*  CALCIUM 8.7*  --  8.6* 8.8* 8.7*  --  8.8*  MG  --   --  1.7 1.5* 1.7 2.6*  --   PHOS  --   --  2.0* 2.6 2.6 2.5  --     CBC: Recent Labs  Lab 12/17/21 1547 12/17/21 2158 12/18/21 1141 12/19/21 0115 12/19/21 0514 12/20/21 0439  WBC 5.5  --  7.9 7.9 7.6 8.1  NEUTROABS 3.1  --   --   --   --   --   HGB 11.0* 11.2* 11.4* 10.6* 11.7* 10.4*  HCT 35.7* 33.0* 35.2* 33.3* 35.5* 31.9*  MCV 94.7  --  91.9 92.0 91.3 91.4  PLT 167  --  143* 157 148* 143*    Coagulation Studies: No results for input(s): "LABPROT", "INR" in the last 72 hours.  Imaging No new brain imaging overnight   ASSESSMENT AND PLAN: 81 year old female with history of seizures on Keppra and Vimpat at home, prior strokes, hypertension, diabetes, CKD 3 who presented with recurrence spells of unresponsiveness.  Concern for seizure recurrence in the setting of UTI.    Epilepsy with breakthrough seizure UTI Hyperammonemia -Suspected breakthrough seizure in the setting of UTI   Recommendation - Home AEDs: LEV 500mg  BID, LCM 100mg  BID, continuing same dose right now -Of note, patient is also on gabapentin 200 mg at bedtime.  We can resume this once patient is extubated -If any further seizures, can increase Vimpat to 150 mg twice daily -Discontinue LTM EEG as no further seizures and patient's mental status is improving -Continue seizure precautions -As needed IV Ativan 2 mg for clinical seizure-like activity   I have spent a total of  26 minutes with the patient reviewing hospital notes,  test results, labs and examining the patient as well as establishing an assessment and plan.  > 50% of time was spent in direct patient care.  Zeb Comfort Epilepsy [facility] For questions after 5pm please refer to AMION to reach the Neurologist on call

## 2021-12-20 NOTE — Progress Notes (Addendum)
NAMECatalia [name], MRN:  [id number], DOB:  March 20, 1940, LOS: 3 ADMISSION DATE:  12/17/2021, CONSULTATION DATE:  11/4 REFERRING MD:  Dr. Leonel Ramsay, CHIEF COMPLAINT:  seizure   History of Present Illness:  81 year old female followed by Palliative Care as out pt (still full code). Resides at [facility]. EMS called 11/4 when pt found unresponsive. Had witnessed seizure by EMS for which she was treated w/ IV midazolam. After this episode she was interactive and able to converse. Shortly after had another episode where she lost LOC. Required BMV. Intubated on arrival to ER at [facility]. Seen by Neuro. Transferred to [facility] for LTM.   Pertinent  Medical History  CKD 3b, prior seizure, chronic CHF, angioedema, cerebral atrophy, seizures, COPD, DM tyoe 2, GERD, LVH Followed by palliative care, obesity  Significant Hospital Events: Including procedures, antibiotic start and stop dates in addition to other pertinent events   11/4 Admitted to [facility] ED for seizure; intubated; transferring to [facility] for cEEG 11/5 On vent, sedated on versed 11/6 Failed PSV due to tachypnea, Tmax 99.5, Versed turned off, limited IV access   Interim History / Subjective:  Tmax 100.2 / WBC 8.1 40% FiO2, PEEP 5  Glucose range 103-141 I/O 1.4L UOP, +331ml in last 24 hours RN reports pt remains off sedation, awake  Objective   Blood pressure 127/72, pulse 89, temperature 100.2 F (37.9 C), resp. rate 11, height 5\' 1"  (1.549 m), weight 106.7 kg, SpO2 96 %.    Vent Mode: PSV;CPAP FiO2 (%):  [40 %] 40 % Set Rate:  [16 bmp] 16 bmp Vt Set:  [380 mL] 380 mL PEEP:  [5 cmH20] 5 cmH20 Pressure Support:  [10 cmH20] 10 cmH20 Plateau Pressure:  [15 cmH20-20 cmH20] 18 cmH20   Intake/Output Summary (Last 24 hours) at 12/20/2021 0900 Last data filed at 12/20/2021 2353 Gross per 24 hour  Intake 1810.33 ml  Output 1465 ml  Net 345.33 ml   Filed Weights   12/18/21 0500 12/19/21 0253 12/20/21 0300  Weight: 103.3 kg 106.1 kg 106.7 kg     Examination: General: elderly adult female lying in bed in NAD on vent HEENT: MM pink/moist, ETT, anicteric, pupils =/reactive Neuro: awake, makes eye contact, wiggles toes, not able to lift head off bed CV: s1s2 RRR, SR, no m/r/g PULM: non-labored at rest, lungs bilaterally coarse, good air entry / no wheezing GI: soft, bsx4 active, NGT displaced on exam > TF stopped  Extremities: warm/dry, generalized 1-2+ edema  Skin: no rashes or lesions   Resolved Hospital Problem list     Assessment & Plan:   Status Epilepticus Hx of seizures on Keppra and lacosamide PTA Hx of Cerebral atrophy: baseline? Acute Toxic Metabolic Encephalopathy CT head negative for acute abnormality. Ammonia 40, UDS pending, ETOH negative.  -EEG, AED's per Neurology  -hold versed infusion  -PRN ativan added for clinical seizure  -neuro protective measures  -add home gabapentin back per Neuro rec's ([facility]) -palliative care follows as outpatient, may need clinical input pending clinical course     Acute Respiratory Failure due to Status Epilepticus COPD, not in exacerbation  -PRVC with LTVV -PSV / SBT as tolerated with goal for extubation  -wean PEEP / FiO2 for sats >90% -VAP prevention measures  -minimize sedation as able  UTI UA with leukocytes and bacteria. PCT negative.  -completed 3d rocephin, monitor off abx  -urine culture appears was not sent  -follow up blood cultures   Hyperkalemia CKD IV -Trend BMP / urinary  output -Replace electrolytes as indicated -Avoid nephrotoxic agents, ensure adequate renal perfusion  Rheumatoid Arthritis On plaquenil -continue plaquenil   Anemia of Chronic Disease -trend CBC  -transfuse for Hgb <7%  Chronic CHF  LVH -trend daily weight, I/O's  -hold home propanolol  -lasix 40 mg x1 with KCL  Dislodged Feeding Tube  Noted on am provider exam -hold TF -advance feeding tube -assess KUB for placement   DM Type II Hgb A1c 5.4 on admit  -SSI,  sensitive scale   GERD -H2 blocker   Depression -sertraline   Best Practice (right click and "Reselect all SmartList Selections" daily)  Diet/type: tubefeeds and NPO w/ meds via tube   DVT prophylaxis: prophylactic heparin  GI prophylaxis: H2B Lines: Central line/PICC Foley:  Yes, and it is still needed Code Status:  full code Last date of multidisciplinary goals of care discussion: 11/4 spoke with husband over phone. He states that if her heart were to stop to not do anything and would like patient to be DNR.    Will update family on arrival.   Critical care time: 76 minutes     Noe Gens, MSN, APRN, NP-C, AGACNP-BC [facility] 12/20/2021, 9:00 AM   Please see [email or web address] for pager details.   From 7A-7P if no response, please call [phone] After hours, please call ELink [phone]

## 2021-12-20 NOTE — Procedures (Addendum)
Patient Name: Rissa Turley  MRN: [id number]  Epilepsy Attending: [name]  Referring Physician/Provider: Kerney Elbe, MD  Duration:  12/19/2021 1800 to 12/20/2021 1121   Patient history: 81 year old female with a history of seizures (on Keppra and Vimpat at home), prior strokes, CKD3, DM, HTN, COPD and frequent UTIs, with recurrent spells of unresponsiveness concerning for seizures. EEG to evaluate for seizure.   Level of alertness:  lethargic /sedated   AEDs during EEG study: LEV, LCM    Technical aspects: This EEG study was done with scalp electrodes positioned according to the 10-20 International system of electrode placement. Electrical activity was reviewed with band pass filter of 1-70Hz , sensitivity of 7 uV/mm, display speed of 20mm/sec with a 60Hz  notched filter applied as appropriate. EEG data were recorded continuously and digitally stored.  Video monitoring was available and reviewed as appropriate.   Description: EEG showed continuous generalized 6 to 9 Hz alpha activity admixed with intermittent generalized 2 to 3 Hz delta slowing.  Hyperventilation and photic stimulation were not performed.    ABNORMALITY - Continuous slow, generalized   IMPRESSION: This study is suggestive of moderate to severe diffuse encephalopathy, nonspecific etiology but could be related to sedation. No seizures or epileptiform discharges were seen throughout the recording.   Rayshaun Needle Barbra Sarks

## 2021-12-21 DIAGNOSIS — J9601 Acute respiratory failure with hypoxia: Secondary | ICD-10-CM | POA: Diagnosis not present

## 2021-12-21 DIAGNOSIS — R569 Unspecified convulsions: Secondary | ICD-10-CM | POA: Diagnosis not present

## 2021-12-21 LAB — COMPREHENSIVE METABOLIC PANEL
ALT: 9 U/L (ref 0–44)
AST: 15 U/L (ref 15–41)
Albumin: 2.7 g/dL — ABNORMAL LOW (ref 3.5–5.0)
Alkaline Phosphatase: 68 U/L (ref 38–126)
Anion gap: 12 (ref 5–15)
BUN: 49 mg/dL — ABNORMAL HIGH (ref 8–23)
CO2: 27 mmol/L (ref 22–32)
Calcium: 9.1 mg/dL (ref 8.9–10.3)
Chloride: 105 mmol/L (ref 98–111)
Creatinine, Ser: 1.86 mg/dL — ABNORMAL HIGH (ref 0.44–1.00)
GFR, Estimated: 27 mL/min — ABNORMAL LOW (ref >60)
Glucose, Bld: 126 mg/dL — ABNORMAL HIGH (ref 70–99)
Potassium: 3.8 mmol/L (ref 3.5–5.1)
Sodium: 144 mmol/L (ref 135–145)
Total Bilirubin: 0.2 mg/dL — ABNORMAL LOW (ref 0.3–1.2)
Total Protein: 6.3 g/dL — ABNORMAL LOW (ref 6.5–8.1)

## 2021-12-21 LAB — GLUCOSE, CAPILLARY
Glucose-Capillary: 124 mg/dL — ABNORMAL HIGH (ref 70–99)
Glucose-Capillary: 121 mg/dL — ABNORMAL HIGH (ref 70–99)
Glucose-Capillary: 121 mg/dL — ABNORMAL HIGH (ref 70–99)
Glucose-Capillary: 125 mg/dL — ABNORMAL HIGH (ref 70–99)
Glucose-Capillary: 118 mg/dL — ABNORMAL HIGH (ref 70–99)
Glucose-Capillary: 117 mg/dL — ABNORMAL HIGH (ref 70–99)

## 2021-12-21 LAB — CBC
HCT: 32.9 % — ABNORMAL LOW (ref 36.0–46.0)
Hemoglobin: 10.3 g/dL — ABNORMAL LOW (ref 12.0–15.0)
MCH: 29.3 pg (ref 26.0–34.0)
MCHC: 31.3 g/dL (ref 30.0–36.0)
MCV: 93.5 fL (ref 80.0–100.0)
Platelets: 154 10*3/uL (ref 150–400)
RBC: 3.52 MIL/uL — ABNORMAL LOW (ref 3.87–5.11)
RDW: 13.9 % (ref 11.5–15.5)
WBC: 8.9 10*3/uL (ref 4.0–10.5)
nRBC: 0 % (ref 0.0–0.2)

## 2021-12-21 MED ORDER — FENTANYL CITRATE PF 50 MCG/ML IJ SOSY
PREFILLED_SYRINGE | INTRAMUSCULAR | Status: AC
  Filled 2021-12-21: qty 2

## 2021-12-21 MED ORDER — RACEPINEPHRINE HCL 2.25 % IN NEBU
0.5000 mL | INHALATION_SOLUTION | RESPIRATORY_TRACT | Status: DC | PRN
  Administered 2021-12-21 (×2): 0.5 mL via RESPIRATORY_TRACT
  Filled 2021-12-21 (×2): qty 0.5

## 2021-12-21 MED ORDER — RACEPINEPHRINE HCL 2.25 % IN NEBU
0.5000 mL | INHALATION_SOLUTION | Freq: Once | RESPIRATORY_TRACT | Status: AC
  Administered 2021-12-21: 0.5 mL via RESPIRATORY_TRACT
  Filled 2021-12-21: qty 0.5

## 2021-12-21 MED ORDER — MIDAZOLAM HCL 2 MG/2ML IJ SOLN
INTRAMUSCULAR | Status: AC
  Filled 2021-12-21: qty 2

## 2021-12-21 MED ORDER — POTASSIUM CHLORIDE 20 MEQ PO PACK
40.0000 meq | PACK | Freq: Once | ORAL | Status: AC
  Administered 2021-12-21: 40 meq
  Filled 2021-12-21: qty 2

## 2021-12-21 MED ORDER — LEVETIRACETAM IN NACL 500 MG/100ML IV SOLN
500.0000 mg | Freq: Two times a day (BID) | INTRAVENOUS | Status: DC
  Administered 2021-12-21 – 2021-12-26 (×10): 500 mg via INTRAVENOUS
  Filled 2021-12-21 (×10): qty 100

## 2021-12-21 MED ORDER — FAMOTIDINE 20 MG PO TABS
10.0000 mg | ORAL_TABLET | Freq: Every day | ORAL | Status: DC
  Administered 2021-12-22 – 2021-12-26 (×4): 10 mg via ORAL
  Filled 2021-12-21 (×4): qty 1

## 2021-12-21 MED ORDER — DEXMEDETOMIDINE HCL IN NACL 400 MCG/100ML IV SOLN
0.4000 ug/kg/h | INTRAVENOUS | Status: DC
  Administered 2021-12-21: 0.4 ug/kg/h via INTRAVENOUS
  Administered 2021-12-21: 0.2 ug/kg/h via INTRAVENOUS
  Filled 2021-12-21: qty 100

## 2021-12-21 MED ORDER — LEVETIRACETAM 100 MG/ML PO SOLN
500.0000 mg | Freq: Two times a day (BID) | ORAL | Status: DC
  Filled 2021-12-21: qty 5

## 2021-12-21 MED ORDER — METHYLPREDNISOLONE SODIUM SUCC 40 MG IJ SOLR
20.0000 mg | Freq: Once | INTRAMUSCULAR | Status: AC
  Administered 2021-12-22: 20 mg via INTRAVENOUS
  Filled 2021-12-21: qty 1

## 2021-12-21 MED ORDER — METHYLPREDNISOLONE SODIUM SUCC 125 MG IJ SOLR
80.0000 mg | Freq: Two times a day (BID) | INTRAMUSCULAR | Status: DC
  Administered 2021-12-21: 80 mg via INTRAVENOUS
  Filled 2021-12-21: qty 2

## 2021-12-21 MED ORDER — ETOMIDATE 2 MG/ML IV SOLN
INTRAVENOUS | Status: AC
  Filled 2021-12-21: qty 20

## 2021-12-21 MED ORDER — FUROSEMIDE 10 MG/ML IJ SOLN
80.0000 mg | Freq: Once | INTRAMUSCULAR | Status: AC
  Administered 2021-12-21: 80 mg via INTRAVENOUS
  Filled 2021-12-21: qty 8

## 2021-12-21 MED ORDER — POTASSIUM CHLORIDE 20 MEQ PO PACK: 40.0000 meq | PACK | Freq: Once | ORAL | Status: DC

## 2021-12-21 MED ORDER — ORAL CARE MOUTH RINSE: 15.0000 mL | OROMUCOSAL | Status: DC | PRN

## 2021-12-21 MED ORDER — RACEPINEPHRINE HCL 2.25 % IN NEBU: 0.5000 mL | INHALATION_SOLUTION | RESPIRATORY_TRACT | Status: DC | PRN

## 2021-12-21 MED ORDER — ROCURONIUM BROMIDE 10 MG/ML (PF) SYRINGE
PREFILLED_SYRINGE | INTRAVENOUS | Status: AC
  Filled 2021-12-21: qty 10

## 2021-12-21 NOTE — Progress Notes (Signed)
[place] Progress Note Patient Name: Katherine [name] DOB: [birth year] MRN: [id number]   Date of Service  12/21/2021  HPI/Events of Note  Stridor  eICU Interventions  Plan: Racemic Epinephrine 2.25%  0.5 mL via neb Q 3 hours PRN stridor.      Intervention Category Major Interventions: Other:  Lysle Dingwall 12/21/2021, 8:06 PM

## 2021-12-21 NOTE — Progress Notes (Signed)
NAMEAdenike [name], MRN:  [id number], DOB:  31-Jul-1940, LOS: 4 ADMISSION DATE:  12/17/2021, CONSULTATION DATE:  11/4 REFERRING MD:  Dr. Leonel Ramsay, CHIEF COMPLAINT:  seizure   History of Present Illness:  81 year old female followed by Palliative Care as out pt (still full code). Resides at [facility]. EMS called 11/4 when pt found unresponsive. Had witnessed seizure by EMS for which she was treated w/ IV midazolam. After this episode she was interactive and able to converse. Shortly after had another episode where she lost LOC. Required BMV. Intubated on arrival to ER at [facility]. Seen by Neuro. Transferred to [facility] for LTM.   Pertinent  Medical History  CKD 3b, prior seizure, chronic CHF, angioedema, cerebral atrophy, seizures, COPD, DM tyoe 2, GERD, LVH Followed by palliative care, obesity  Significant Hospital Events: Including procedures, antibiotic start and stop dates in addition to other pertinent events   11/4 Admitted to [facility] ED for seizure; intubated; transferring to [facility] for cEEG 11/5 On vent, sedated on versed 11/6 Failed PSV due to tachypnea, Tmax 99.5, Versed turned off, limited IV access  11/7 PSV wean, low volumes. Off sedation.   Interim History / Subjective:  Weaning on PSV 10/5 Tmax 99.7  BC pending, NGTD. Tracheal aspirate with normal flora  Glucose range 121-131  I/O 3.4L, -2.5L in last 24h  Objective   Blood pressure 133/79, pulse 86, temperature 99.7 F (37.6 C), temperature source Bladder, resp. rate (!) 25, height 5\' 1"  (1.549 m), weight 103.4 kg, SpO2 97 %.    Vent Mode: PSV;CPAP FiO2 (%):  [40 %] 40 % Set Rate:  [16 bmp] 16 bmp Vt Set:  [380 mL] 380 mL PEEP:  [5 cmH20] 5 cmH20 Pressure Support:  [10 cmH20] 10 cmH20 Plateau Pressure:  [15 cmH20-19 cmH20] 16 cmH20   Intake/Output Summary (Last 24 hours) at 12/21/2021 0813 Last data filed at 12/21/2021 0800 Gross per 24 hour  Intake 888.66 ml  Output 3355 ml  Net -2466.34 ml   Filed Weights   12/19/21 0253  12/20/21 0300 12/21/21 0500  Weight: 106.1 kg 106.7 kg 103.4 kg    Examination: General: elderly adult female lying in bed in NAD on vent HEENT: MM pink/moist, ETT, anicteric, pupils =/reactive Neuro: Awake/alert, following commands, appropriate  CV: s1s2 RRR, no m/r/g PULM: non-labored at rest, lungs bilaterally clear GI: soft, bsx4 active  Extremities: warm/dry, 1-2+ generalized edema  Skin: no rashes or lesions   Resolved Hospital Problem list     Assessment & Plan:   Status Epilepticus Hx of seizures on Keppra and lacosamide PTA Hx of Cerebral atrophy: baseline? Acute Toxic Metabolic Encephalopathy CT head negative for acute abnormality. Ammonia 40, UDS pending, ETOH negative.  -AED's per Neurology  -continue home gabapentin  -PT / OT efforts    Acute Respiratory Failure due to Status Epilepticus COPD, not in exacerbation  -PSV with goal for extubation  -wean PEEP / FiO2 for sats >90% -pulmonary hygiene -IS, mobilize post extubation  -follow intermittent CXR  -bedside swallow eval post extubation -brovana + pulmicort  UTI UA with leukocytes and bacteria. PCT negative.  -monitor off abx  -urine culture was not sent  -blood cultures pending, NGTD   Hyperkalemia CKD IV -lasix 80mg  IV x1 with KCL  -Trend BMP / urinary output -Replace electrolytes as indicated -Avoid nephrotoxic agents, ensure adequate renal perfusion  Rheumatoid Arthritis On plaquenil at baseline  -continue plaquenil   Anemia of Chronic Disease -trend CBC  -transfuse for Hgb<7%  Chronic CHF  LVH -follow daily weight, I/O's  -hold home propanolol for now  -lasix as above   Dislodged Feeding Tube  Noted on am provider exam -remove with extubation  DM Type II Hgb A1c 5.4 on admit  -SSI, sensitive scale   GERD -H2 blocker   Depression -sertraline   Best Practice (right click and "Reselect all SmartList Selections" daily)  Diet/type: tubefeeds and NPO w/ meds via tube   DVT  prophylaxis: prophylactic heparin  GI prophylaxis: H2B Lines: Central line/PICC Foley:  Yes, and it is still needed Code Status:  full code Last date of multidisciplinary goals of care discussion: 11/4 spoke with husband over phone. He states that if her heart were to stop to not do anything and would like patient to be DNR.     Critical care time: 31 minutes     Noe Gens, MSN, APRN, NP-C, AGACNP-BC [facility] 12/21/2021, 8:13 AM   Please see [email or web address] for pager details.   From 7A-7P if no response, please call [phone] After hours, please call ELink [phone]

## 2021-12-21 NOTE — Procedures (Signed)
Extubation Procedure Note  Patient Details:   Name: Katherine [name] DOB: 01/22/1941 MRN: [id number]   Airway Documentation:    Vent end date: 12/21/21 Vent end time: 0832   Evaluation  O2 sats: stable throughout Complications: No apparent complications Patient did tolerate procedure well. Bilateral Breath Sounds: Diminished, Rhonchi   Yes  Patient extubated per MD order. Positive cuff leak. Patient coughing up secretions. Vitals are stable on 6L [place]. RN at bedside.  Herbie Saxon Khaniya Tenaglia 12/21/2021, 8:32 AM

## 2021-12-21 NOTE — Progress Notes (Signed)
Assessed at bedside post extubation, mild upper airway noise / stridor.  No increase in respiratory effort / accessory muscle use.  Pt able to cough and clear secretions without difficulty. Encouraged pursed lip breathing maneuver with some improvement in stridor.    Plan: -low dose precedex -racemic epi neb -continue albuterol PRN  -Brovana + pulmicort neb    Katherine Gens, MSN, APRN, NP-C, AGACNP-BC [facility] 12/21/2021, 9:40 AM   Please see [email or web address] for pager details.   From 7A-7P if no response, please call [phone] After hours, please call ELink [phone]

## 2021-12-21 NOTE — Progress Notes (Addendum)
[place] Progress Note Patient Name: Katherine [name] DOB: April 08, 1940 MRN: [id number]   Date of Service  12/21/2021  HPI/Events of Note  Stridor noted to get worse after improving with Racemic Epinephrine Neb earlier. Sat = 100%. Patient not able to take PO Keppra and Gabapentin.   eICU Interventions  Plan: Please give already ordered Q 3 hour Recemic Epinephrine Neb now. Add Solumedrol 40 mg IV now and Q 6 hours X 4 doses.  Will change Keppra dose route to IV.  Not able to change Gabapentin to IV.  Will request that the PCCM ground team evaluate the patient at bedside.      Intervention Category Major Interventions: Other:  Lysle Dingwall 12/21/2021, 11:08 PM

## 2021-12-21 NOTE — Progress Notes (Signed)
PCCM INTERVAL PROGRESS NOTE   Called to bedside to evaluate patient for stridor. Extubated earlier today. Had stridor at that time improved with racemic epinephrine. Now developed stridor again.   Solumedrol and racemic neb given with total resolution of stridor.   - Will schedule an additional dose of solumedrol and plan for further doses of racemic epinephrine overnight.  - Continue to monitor for reintubation need.    Georgann Housekeeper, AGACNP-BC [facility]  See Amion for personal pager PCCM on call pager [phone] until 7pm. Please call Elink 7p-7a. [phone]  12/21/2021 11:51 PM

## 2021-12-21 NOTE — Progress Notes (Signed)
Subjective: Patient was extubated this morning.  Had aTmax 100.6 but currently afebrile.  ROS: negative except above  Examination  Vital signs in last 24 hours: Temp:  [99.5 F (37.5 C)-100.6 F (38.1 C)] 99.5 F (37.5 C) (11/08 1100) Pulse Rate:  [74-105] 88 (11/08 1100) Resp:  [16-27] 26 (11/08 1100) BP: (79-157)/(60-104) 107/63 (11/08 1100) SpO2:  [91 %-98 %] 97 % (11/08 1100) FiO2 (%):  [40 %] 40 % (11/08 0800) Weight:  [103.4 kg] 103.4 kg (11/08 0500)  General: lying in bed, appears to have some stridor during breathing Neuro: Awake, alert, oriented to self and place but not to time, follows all commands, PERRLA, EOMI, no facial asymmetry, antigravity strength in all 4 extremities  Basic Metabolic Panel: Recent Labs  Lab 12/18/21 1141 12/19/21 0115 12/19/21 0514 12/19/21 1710 12/20/21 0439 12/21/21 0316  NA 139 144 142  --  142 144  K 4.4 4.0 3.7  --  3.9 3.8  CL 103 106 105  --  106 105  CO2 27 26 24   --  27 27  GLUCOSE 103* 133* 115*  --  141* 126*  BUN 15 26* 26*  --  35* 49*  CREATININE 1.63* 1.67* 1.54*  --  1.77* 1.86*  CALCIUM 8.6* 8.8* 8.7*  --  8.8* 9.1  MG 1.7 1.5* 1.7 2.6*  --   --   PHOS 2.0* 2.6 2.6 2.5  --   --     CBC: Recent Labs  Lab 12/17/21 1547 12/17/21 2158 12/18/21 1141 12/19/21 0115 12/19/21 0514 12/20/21 0439 12/21/21 0316  WBC 5.5  --  7.9 7.9 7.6 8.1 8.9  NEUTROABS 3.1  --   --   --   --   --   --   HGB 11.0*   < > 11.4* 10.6* 11.7* 10.4* 10.3*  HCT 35.7*   < > 35.2* 33.3* 35.5* 31.9* 32.9*  MCV 94.7  --  91.9 92.0 91.3 91.4 93.5  PLT 167  --  143* 157 148* 143* 154   < > = values in this interval not displayed.   Coagulation Studies: No results for input(s): "LABPROT", "INR" in the last 72 hours.  Imaging No new brain imaging overnight     ASSESSMENT AND PLAN: 81 year old female with history of seizures on Keppra and Vimpat at home, prior strokes, hypertension, diabetes, CKD 3 who presented with recurrence spells of  unresponsiveness.  Concern for seizure recurrence in the setting of UTI.   Epilepsy with breakthrough seizure UTI Hyperammonemia -Suspected breakthrough seizure in the setting of UTI   Recommendation - Home AEDs: LEV 500mg  BID, LCM 100mg  BID, continuing same dose as seizure-most likely in the setting of UTI -Of note, patient is also on gabapentin 200 mg at bedtime.  We can resume this once patient is able to take p.o. -If any further seizures, can increase Vimpat to 150 mg twice daily -Continue seizure precautions -As needed IV Ativan 2 mg for clinical seizure-like activity -Follow-up with neurology in 3 months -Neurology will sign off.  Please call us for further questions.  Seizure precautions: Per [place] statutes, patients with seizures are not allowed to drive until they have been seizure-free for six months and cleared by a physician    Use caution when using heavy equipment or power tools. Avoid working on ladders or at heights. Take showers instead of baths. Ensure the water temperature is not too high on the home water heater. Do not go swimming alone. Do  not lock yourself in a room alone (i.e. bathroom). When caring for infants or small children, sit down when holding, feeding, or changing them to minimize risk of injury to the child in the event you have a seizure. Maintain good sleep hygiene. Avoid alcohol.    If patient has another seizure, call 911 and bring them back to the ED if: A.  The seizure lasts longer than 5 minutes.      B.  The patient doesn't wake shortly after the seizure or has new problems such as difficulty seeing, speaking or moving following the seizure C.  The patient was injured during the seizure D.  The patient has a temperature over 102 F (39C) E.  The patient vomited during the seizure and now is having trouble breathing    During the Seizure   - First, ensure adequate ventilation and place patients on the floor on their left side   Loosen clothing around the neck and ensure the airway is patent. If the patient is clenching the teeth, do not force the mouth open with any object as this can cause severe damage - Remove all items from the surrounding that can be hazardous. The patient may be oblivious to what's happening and may not even know what he or she is doing. If the patient is confused and wandering, either gently guide him/her away and block access to outside areas - Reassure the individual and be comforting - Call 911. In most cases, the seizure ends before EMS arrives. However, there are cases when seizures may last over 3 to 5 minutes. Or the individual may have developed breathing difficulties or severe injuries. If a pregnant patient or a person with diabetes develops a seizure, it is prudent to call an ambulance. - Finally, if the patient does not regain full consciousness, then call EMS. Most patients will remain confused for about 45 to 90 minutes after a seizure, so you must use judgment in calling for help.    After the Seizure (Postictal Stage)   After a seizure, most patients experience confusion, fatigue, muscle pain and/or a headache. Thus, one should permit the individual to sleep. For the next few days, reassurance is essential. Being calm and helping reorient the person is also of importance.   Most seizures are painless and end spontaneously. Seizures are not harmful to others but can lead to complications such as stress on the lungs, brain and the heart. Individuals with prior lung problems may develop labored breathing and respiratory distress.    I have spent a total of  26 minutes with the patient reviewing hospital notes,  test results, labs and examining the patient as well as establishing an assessment and plan.  > 50% of time was spent in direct patient care.   Zeb Comfort Epilepsy [facility] For questions after 5pm please refer to AMION to reach the Neurologist on call

## 2021-12-22 DIAGNOSIS — G40919 Epilepsy, unspecified, intractable, without status epilepticus: Secondary | ICD-10-CM

## 2021-12-22 DIAGNOSIS — J9601 Acute respiratory failure with hypoxia: Secondary | ICD-10-CM | POA: Diagnosis not present

## 2021-12-22 DIAGNOSIS — G40901 Epilepsy, unspecified, not intractable, with status epilepticus: Principal | ICD-10-CM

## 2021-12-22 DIAGNOSIS — R569 Unspecified convulsions: Secondary | ICD-10-CM | POA: Diagnosis not present

## 2021-12-22 DIAGNOSIS — R061 Stridor: Secondary | ICD-10-CM

## 2021-12-22 LAB — BASIC METABOLIC PANEL
Anion gap: 11 (ref 5–15)
BUN: 50 mg/dL — ABNORMAL HIGH (ref 8–23)
CO2: 30 mmol/L (ref 22–32)
Calcium: 9.3 mg/dL (ref 8.9–10.3)
Chloride: 105 mmol/L (ref 98–111)
Creatinine, Ser: 1.99 mg/dL — ABNORMAL HIGH (ref 0.44–1.00)
GFR, Estimated: 25 mL/min — ABNORMAL LOW (ref >60)
Glucose, Bld: 134 mg/dL — ABNORMAL HIGH (ref 70–99)
Potassium: 5.1 mmol/L (ref 3.5–5.1)
Sodium: 146 mmol/L — ABNORMAL HIGH (ref 135–145)

## 2021-12-22 LAB — CULTURE, BLOOD (ROUTINE X 2)
Culture: NO GROWTH
Culture: NO GROWTH
Special Requests: ADEQUATE

## 2021-12-22 LAB — CBC
HCT: 35.9 % — ABNORMAL LOW (ref 36.0–46.0)
Hemoglobin: 11 g/dL — ABNORMAL LOW (ref 12.0–15.0)
MCH: 29.4 pg (ref 26.0–34.0)
MCHC: 30.6 g/dL (ref 30.0–36.0)
MCV: 96 fL (ref 80.0–100.0)
Platelets: 177 10*3/uL (ref 150–400)
RBC: 3.74 MIL/uL — ABNORMAL LOW (ref 3.87–5.11)
RDW: 13.8 % (ref 11.5–15.5)
WBC: 8.6 10*3/uL (ref 4.0–10.5)
nRBC: 0 % (ref 0.0–0.2)

## 2021-12-22 LAB — GLUCOSE, CAPILLARY
Glucose-Capillary: 140 mg/dL — ABNORMAL HIGH (ref 70–99)
Glucose-Capillary: 132 mg/dL — ABNORMAL HIGH (ref 70–99)
Glucose-Capillary: 150 mg/dL — ABNORMAL HIGH (ref 70–99)
Glucose-Capillary: 134 mg/dL — ABNORMAL HIGH (ref 70–99)
Glucose-Capillary: 114 mg/dL — ABNORMAL HIGH (ref 70–99)
Glucose-Capillary: 131 mg/dL — ABNORMAL HIGH (ref 70–99)

## 2021-12-22 MED ORDER — METHYLPREDNISOLONE SODIUM SUCC 40 MG IJ SOLR
40.0000 mg | Freq: Three times a day (TID) | INTRAMUSCULAR | Status: AC
  Administered 2021-12-22 – 2021-12-23 (×3): 40 mg via INTRAVENOUS
  Filled 2021-12-22 (×3): qty 1

## 2021-12-22 NOTE — Evaluation (Signed)
Clinical/Bedside Swallow Evaluation Patient Details  Name: Katherine [name] MRN: [id number] Date of Birth: 06/09/1940  Today's Date: 12/22/2021 Time: SLP Start Time (ACUTE ONLY): 0957 SLP Stop Time (ACUTE ONLY): 1024 SLP Time Calculation (min) (ACUTE ONLY): 27 min  Past Medical History:  Past Medical History:  Diagnosis Date   Acute encephalopathy 02/11/2019   Acute on chronic anemia 01/03/2019   Acute on chronic congestive heart failure (HCC)    Acute respiratory failure with hypoxia and hypercapnia ([place]) 04/19/2021   Angioedema    Anxiety and depression    Aphasia 06/28/2017   Cerebral atrophy ([place]) 06/19/2021   Chest pain 07/15/2018   CHF (congestive heart failure) (HCC)    Chronic constipation    Chronic kidney disease, stage 4 (severe) ([place]) 06/19/2021   COPD (chronic obstructive pulmonary disease) ([place])    COPD exacerbation ([place]) 11/30/2017   COPD with acute exacerbation ([place]) 02/24/2018   COVID-19 12/21/2018   COVID-19 virus infection 12/21/2018   Delirium    Diabetes mellitus type 2, uncomplicated ([place]) 54/65/0354   Diabetes mellitus without complication (HCC)    Elevated rheumatoid factor 09/05/2017   Gallstones    GERD (gastroesophageal reflux disease)    Hematemesis 04/28/2018   Hemoptysis 12/20/2018   Hypertension    Influenza A 04/13/2018   Left knee pain 06/12/2016   Left ventricular hypertrophy    Localized edema 05/10/2018   Osteoarthritis    Pain in finger of left hand 09/12/2016   Pain in soft tissues of limb 05/20/2013   Formatting of this note might be different from the original. Last Assessment & Plan:  Handled by [facility] doctor; wearing boot   Palliative care encounter 05/10/2018   Possible Seizures ([place]) 10/08/2017   Pressure injury of skin 10/29/2017   Prurigo nodularis    Rheumatoid arthritis ([place])    on plaquenil   Right hand pain 03/21/2017   Seizures ([place])    Sepsis ([place]) 10/24/2017   Sepsis due to gram-negative UTI ([place]) 06/19/2019    Stroke ([place])    Stroke ([place]) 06/19/2019   Stroke-like episode ([place]) s/p IV tpa 10/04/2017   Tobacco abuse    Urinary tract infection 11/22/2017   UTI (urinary tract infection) 08/21/2018   Vitamin D deficiency disease    Past Surgical History:  Past Surgical History:  Procedure Laterality Date   CHOLECYSTECTOMY     POLYPECTOMY  11/2011   vocal cord   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   Procedure: TOTAL KNEE ARTHROPLASTY;  Surgeon: Hessie Knows, MD;  Location: [facility] ORS;  Service: Orthopedics;  Laterality: Right;   HPI:  81 year old female with PMH of CKD, chronic CHF, angioedema, cerebral atrophy, status epilepticus, COPD, CVA, DM type 2, GERD, LVH. Pt was admitted to [facility] ED for seizure. The breakthrough seizure is in the setting of a UTI. Pt intubated 11/4-11/8. Previous admission pt was evaluated and Dys 3/thin liquid diet recommended once pt roused enough for POs.    Assessment / Plan / Recommendation  Clinical Impression  Pt seen for clinical swallow evaluation. She was alert throughout the encounter but slightly confused and presenting with minor verbal comprehension deficits. Per chart review, pt with prolonged intubation (4 days). Pt's voice is considerably horse. Productive baseline cough noted, which required oral suction to clear secretions. Oral motor assessment was unremarkable, however, pt was cognitively unable to elicit a volitional swallow. Pt is edentulous. Trials of thin and nectar thick liquids, and puree consistency given. An immediate and delayed cough noted after  thin and nectar thick liquids. It was unclear wether cough was related to PO intake, excessive secretions, or pharyngeal irritation s/p extubation. Trials of puree consistency tolerated well, without s/sx aspiration. Reduced labial seal present on all liquid trials, however, suspect this is baseline for pt. Given s/sx excessive coughing/throat clearing noted, pt should continue NPO except for medications  crushed in puree. Pt can have ice chips after oral care. SLP will f/u for diet initiation vs. instrumental study. SLP Visit Diagnosis: Dysphagia, pharyngeal phase (R13.13)    Aspiration Risk  Moderate aspiration risk    Diet Recommendation NPO except meds;Ice chips PRN after oral care   Medication Administration: Crushed with puree Supervision: Patient able to self feed Compensations: Small sips/bites;Slow rate Postural Changes: Seated upright at 90 degrees    Other  Recommendations Oral Care Recommendations: Oral care QID    Recommendations for follow up therapy are one component of a multi-disciplinary discharge planning process, led by the attending physician.  Recommendations may be updated based on patient status, additional functional criteria and insurance authorization.  Follow up Recommendations Follow physician's recommendations for discharge plan and follow up therapies      Assistance Recommended at Discharge PRN  Functional Status Assessment    Frequency and Duration            Prognosis Prognosis for Safe Diet Advancement: Good      Swallow Study   General Date of Onset: 12/17/21 HPI: 81 year old female with PMH of CKD, chronic CHF, angioedema, cerebral atrophy, status epilepticus, COPD, CVA, DM type 2, GERD, LVH. Pt was admitted to [facility] ED for seizure. The breakthrough seizure is in the setting of a UTI. Pt intubated 11/4-11/8. Previous admission pt was evaluated and Dys 3/thin liquid diet recommended once pt roused enough for POs. Type of Study: Bedside Swallow Evaluation Previous Swallow Assessment: see HPI Diet Prior to this Study: NPO Temperature Spikes Noted: No Respiratory Status: Nasal cannula History of Recent Intubation: Yes Length of Intubations (days): 4 days Date extubated: 12/21/21 Behavior/Cognition: Alert;Cooperative;Requires cueing Oral Cavity Assessment: Within Functional Limits Oral Care Completed by SLP: No Oral Cavity - Dentition:  Edentulous Vision: Functional for self-feeding Self-Feeding Abilities: Needs assist Patient Positioning: Upright in bed Baseline Vocal Quality: Hoarse Volitional Cough: Strong Volitional Swallow: Unable to elicit (cognitively unable to elicit)    Oral/Motor/Sensory Function Overall Oral Motor/Sensory Function: Within functional limits   Ice Chips Ice chips: Not tested   Thin Liquid Thin Liquid: Impaired Presentation: Self Fed;Cup Pharyngeal  Phase Impairments: Cough - Delayed;Cough - Immediate;Change in Vital Signs    Nectar Thick Nectar Thick Liquid: Not tested   Honey Thick Honey Thick Liquid: Not tested   Puree Puree: Within functional limits Presentation: Spoon   Solid     Solid: Not tested      [facility] Student SLP  12/22/2021,11:31 AM

## 2021-12-22 NOTE — Progress Notes (Signed)
NAMEPecola [name], MRN:  [id number], DOB:  Aug 16, 1940, LOS: 5 ADMISSION DATE:  12/17/2021, CONSULTATION DATE:  11/4 REFERRING MD:  Dr. Leonel Ramsay, CHIEF COMPLAINT:  seizure   History of Present Illness:  81 year old female followed by Palliative Care as out pt (was still full code prior to admission) Resides at [facility]. EMS called 11/4 when pt found unresponsive. Had witnessed seizure by EMS for which she was treated w/ IV midazolam. After this episode she was interactive and able to converse. Shortly after had another episode where she lost LOC. Required BMV. Intubated on arrival to ER at [facility]. Seen by Neuro. Transferred to [facility] for LTM.   Pertinent  Medical History  CKD 3b, prior seizure, chronic CHF, angioedema, cerebral atrophy, seizures, COPD, DM tyoe 2, GERD, LVH Followed by palliative care, obesity  Significant Hospital Events: Including procedures, antibiotic start and stop dates in addition to other pertinent events   11/4 Admitted to [facility] ED for seizure; intubated; transferring to [facility] for cEEG 11/5 On vent, sedated on versed 11/6 Failed PSV due to tachypnea, Tmax 99.5, Versed turned off, limited IV access  11/7 PSV wean, low volumes. Off sedation.  11/8 extubated. Rcvd epi and steroids for stridor  11/9 likely transfer out of ICU  Interim History / Subjective:  Extubated yesterday Did get some supportive interventions for possible stridor   She is asking for water   I shut off the precedex   Objective   Blood pressure 131/80, pulse 78, temperature 98 F (36.7 C), temperature source Axillary, resp. rate (!) 23, height 5\' 1"  (1.549 m), weight 90 kg, SpO2 91 %.    FiO2 (%):  [92 %] 92 %   Intake/Output Summary (Last 24 hours) at 12/22/2021 1019 Last data filed at 12/22/2021 0800 Gross per 24 hour  Intake 167.44 ml  Output 1600 ml  Net -1432.56 ml   Filed Weights   12/20/21 0300 12/21/21 0500 12/22/21 0500  Weight: 106.7 kg 103.4 kg 90 kg    Examination: General:  Chronically ill elderly F NAD  HEENT:  NCAT pink mm  Neuro: Awake alert following commands oriented x 1-2 CV: rrr s1s2  PULM: CTAb even unlabored  GI: soft ndnt  Extremities: no acute joint deformity  Skin: c/d/w   Resolved Hospital Problem list    SE  Assessment & Plan:   Acute metabolic encephalopathy  Epilepsy with status epilepticus  Hx 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 Ammonia 40, UDS pending, ETOH negative.  P -Keppra, Vimpat per neuro -continue home gabapentin  -PT / OT efforts -delirium precautions     Hx COPD without acute exacerbation  Stridor, improved   OSA  Hypoxic respiratory failure -? Hx vocal cord polyp  P -wean O2 for SpO2 88-92% -cont brovana, yupelri, pulmicort, PRN albuterol -pulm hygiene   Asymptomatic UTI  UA with leukocytes and bacteria.  PCT neg  P -monitor off abx   CKD IV Hypernatremia  -lasix 80mg  IV x1 with KCL  -Trend BMP / urinary output -Replace electrolytes as indicated -Avoid nephrotoxic agents, ensure adequate renal perfusion  Chronic diastolic HF  LVH Abdominal aortic aneurysm HTN -follow daily weight, I/O's  -hold home propanolol for now  -lasix as above  -follows w [facility] outpt for AAA  RA On plaquenil at baseline  -continue plaquenil   Anemia of chronic disease  -trend CBC  -transfuse for Hgb<7%  DM2  -SSI, sensitive scale  GERD -H2 blocker   Depression -sertraline   Physical deconditioning DNR status -bed bound at baseline  -follows w palliative outpt for sx management   Best Practice (right click and "Reselect all SmartList Selections" daily)  Diet/type: NPO w/ oral meds pending SLP    DVT prophylaxis: prophylactic heparin  GI prophylaxis: H2B Lines: Central line/PICC Foley:  Yes, and it is still needed Code Status:  DNR Last date of multidisciplinary goals of care discussion: 11/4 spoke with husband  over phone. He states that if her heart were to stop to not do anything and would like patient to be DNR.   Dispo: suspect likely transfer out of ICU 11/9  CRITICAL CARE Performed by: Cristal Generous   Total critical care time: n/a minutes  Eliseo Gum MSN, AGACNP-BC [place] for pager  12/22/2021, 10:19 AM

## 2021-12-22 NOTE — Progress Notes (Signed)
Called to evaluate patient for stridor.  At the time I arrived, she was speaking in sentences, awake and alert, still with minimal stridor but apparently greatly improved after steroids and racemic epi.    No need for intubation at this time, but I am available should anything change overnight.

## 2021-12-22 NOTE — Evaluation (Signed)
Physical Therapy Evaluation Patient Details Name: Katherine [name] MRN: [id number] DOB: 09-May-1940 Today's Date: 12/22/2021  History of Present Illness  Pt is an 81 y/o female admitted 11/4 from SNF after found unresponsive by staff with witnessed seizure by EMS and multiple other episodes of similar seizure-like activity with possible lack of consciousness  Intubated at [facility] and transferred to [facility] for further neurology workupand long-term EEG.  PMHx:  Acute on Chronic CHF, cerebral atrophy, COPD, COVID, DM2, HTN, OA, RA, seizure, Stroke, R TKA  Clinical Impression  Pt is at or close to baseline functioning and should be safe at the nursing home with dependent levels of care. There are no further acute PT needs.  Will sign off at this time.        Recommendations for follow up therapy are one component of a multi-disciplinary discharge planning process, led by the attending physician.  Recommendations may be updated based on patient status, additional functional criteria and insurance authorization.  Follow Up Recommendations No PT follow up      Assistance Recommended at Discharge Frequent or constant Supervision/Assistance  Patient can return home with the following  A lot of help with walking and/or transfers;A lot of help with bathing/dressing/bathroom;Assistance with cooking/housework;Direct supervision/assist for medications management;Direct supervision/assist for financial management;Assist for transportation;Help with stairs or ramp for entrance    Equipment Recommendations None recommended by PT  Recommendations for Other Services       Functional Status Assessment Patient has not had a recent decline in their functional status     Precautions / Restrictions Precautions Precautions: Fall      Mobility  Bed Mobility Overal bed mobility: Needs Assistance Bed Mobility: Supine to Sit, Sit to Supine     Supine to sit: Total assist, +2 for physical assistance Sit to  supine: Total assist, +2 for physical assistance   General bed mobility comments: sat EOB with min guard and 1-2 hand assist    Transfers                   General transfer comment: NA    Ambulation/Gait                  Stairs            Wheelchair Mobility    Modified Rankin (Stroke Patients Only)       Balance Overall balance assessment: Needs assistance Sitting-balance support: Single extremity supported, Bilateral upper extremity supported, Feet supported Sitting balance-Leahy Scale: Poor Sitting balance - Comments: reliant on UE's and light minimal external support                                     Pertinent Vitals/Pain Pain Assessment Pain Assessment: Faces Faces Pain Scale: Hurts a little bit Pain Location: joints with ROM in LE's Pain Descriptors / Indicators: Discomfort Pain Intervention(s): Monitored during session    Home Living Family/patient expects to be discharged to:: Skilled nursing facility                   Additional Comments: pt questionable historian, is w/c level at baseline    Prior Function Prior Level of Function : Needs assist       Physical Assist : Mobility (physical);ADLs (physical) Mobility (physical): Bed mobility;Transfers   Mobility Comments: Patient is lifted to w/c from the bed.  Pt unable to self propel the w/c,  Pt needs  significant assist for bed mobility. ADLs Comments: Pt needs help for all ADL's at the bed level.  Pt reports can feed herself (with set up)     Hand Dominance        Extremity/Trunk Assessment   Upper Extremity Assessment Upper Extremity Assessment: Defer to OT evaluation    Lower Extremity Assessment Lower Extremity Assessment: RLE deficits/detail;LLE deficits/detail RLE Deficits / Details: SLR against gravity without assist, generally quads/hip flexors 3-/5, knee flexion ot 70 deg.  Overall significant general weakness RLE Coordination: decreased  fine motor;decreased gross motor LLE Deficits / Details: limited knee PROM, significant weakness overall at 2+3-/5 LLE Coordination: decreased fine motor;decreased gross motor    Cervical / Trunk Assessment Cervical / Trunk Assessment: Kyphotic  Communication   Communication: Expressive difficulties  Cognition Arousal/Alertness: Awake/alert Behavior During Therapy: Anxious, Flat affect Overall Cognitive Status: No family/caregiver present to determine baseline cognitive functioning                                          General Comments General comments (skin integrity, edema, etc.): stridor type breathing however improved after return to supine    Exercises     Assessment/Plan    PT Assessment Patient does not need any further PT services  PT Problem List         PT Treatment Interventions      PT Goals (Current goals can be found in the Care Plan section)  Acute Rehab PT Goals PT Goal Formulation: Patient unable to participate in goal setting Potential to Achieve Goals: Poor    Frequency       Co-evaluation   Reason for Co-Treatment: For patient/therapist safety           AM-PAC PT "6 Clicks" Mobility  Outcome Measure Help needed turning from your back to your side while in a flat bed without using bedrails?: Total Help needed moving from lying on your back to sitting on the side of a flat bed without using bedrails?: Total Help needed moving to and from a bed to a chair (including a wheelchair)?: Total Help needed standing up from a chair using your arms (e.g., wheelchair or bedside chair)?: Total Help needed to walk in hospital room?: Total Help needed climbing 3-5 steps with a railing? : Total 6 Click Score: 6    End of Session   Activity Tolerance: Patient limited by fatigue (fear limitations) Patient left: in bed;with call bell/phone within reach;with SCD's reapplied Nurse Communication: Mobility status PT Visit Diagnosis: Other  abnormalities of gait and mobility (R26.89);Muscle weakness (generalized) (M62.81);Pain Pain - part of body:  (LE joints with movement)    Time: 3009-2330 PT Time Calculation (min) (ACUTE ONLY): 22 min   Charges:   PT Evaluation $PT Eval Moderate Complexity: 1 Mod          12/22/2021  Ginger Carne., PT Acute Rehabilitation Services [phone]  (office)  Tessie Fass Jarita Raval 12/22/2021, 12:23 PM

## 2021-12-22 NOTE — Progress Notes (Signed)
Nutrition Follow-up  DOCUMENTATION CODES:   Not applicable  INTERVENTION:  - SLP rec'd NPO - Per MD, holding off on Cortrak until tomorrow with reassessment in the AM  - If within pts goals of care and patient agreeable to having Cotrak placed and plan for Cortrak tomorrow, would recommend below TF regimen: Tube feeding via Cotrak (if placed): Osmolite 1.2 at 60 ml/h (1440 ml per day) Prosource TF20 60 ml daily Provides 1808 kcal, 100 gm protein, 1181 ml free water daily  Consider 172m Q4H FWF for total 1788mday   NUTRITION DIAGNOSIS:   Inadequate oral intake related to inability to eat as evidenced by NPO status. *Plan for Cortrak pending  GOAL:   Patient will meet greater than or equal to 90% of their needs  MONITOR:   TF tolerance, Vent status  REASON FOR ASSESSMENT:   Consult Enteral/tube feeding initiation and management  ASSESSMENT:   89.o. female with PMH CKD3, CHF, COPD, T2DM, and GERD who resides at SNF presented after being found unresponsive with witnessed seizure by EMS.  11/4: arrived intubated 11/5 TFs of Vital High Protein started at 4050mr 11/8 Extubated 11/9 SLP eval, pt rec'd NPO  Met with patient at bedside this AM. Patient noted to be occasionally confused during conversation and somewhat difficult to understand. Unsure of a UBW but reports weight loss PTA, although could not specify a time frame or cause. Per chart review, weight stable the 3 months PTA. However, patient was consistently weighing 220-230# at beginning of admission and pt weighed today at 198#, so current weight status difficult to assess. Pt reports eating 3 meals a day PTA, some eaten at the table and some in her bed (although pt reportedly bed bound). Pt admits her current appetite is poor and she would not accept pureed food.   Unfortunately, SLP evaluated patient today and pt recommended NPO. Discussed patient with Dr. AlvElsworth Sohod per MD, plan to hold off on Cotrak order for  now and reassess in the AM after determining goals of care and pt acceptance of having Cortrak placed.   Medications reviewed and include: Insulin, Senokot, Keppra,   Labs reviewed:  Sodium 146 (H) Creatinine 1.99 (H) HA1C 5.4 Blood glucose 117-140 x24 hours    Diet Order:   Diet Order             Diet NPO time specified Except for: Ice Chips  Diet effective now                   EDUCATION NEEDS:   No education needs have been identified at this time  Skin:  Skin Assessment: Reviewed RN Assessment  Last BM:  11/8  Height:  Ht Readings from Last 1 Encounters:  12/17/21 _0  (1.549 m)   Weight:  Wt Readings from Last 1 Encounters:  12/22/21 90 kg    Ideal Body Weight:  47.7 kg  BMI:  Body mass index is 37.49 kg/m.  Estimated Nutritional Needs:  Kcal:  1750-1900  Protein:  100-125 grams Fluid:  1750-1900 mL    AspSamson Frederic, LDN For contact information, refer to [facility]

## 2021-12-22 NOTE — Progress Notes (Signed)
Pt transferred to unit in bed. Tele placed on pt and vital signs collected per Ayana, NT. Pt A&O to name, birthdate, and place. Bed placed in lowest position, with personal items within reach, and call bell with in reach.

## 2021-12-22 NOTE — Evaluation (Signed)
Occupational Therapy Evaluation Patient Details Name: Katherine [name] MRN: [id number] DOB: 11/06/1940 Today's Date: 12/22/2021   History of Present Illness Pt is an 81 y/o female admitted 11/4 from SNF after found unresponsive by staff with witnessed seizure by EMS and multiple other episodes of similar seizure-like activity with possible lack of consciousness  Intubated at [facility] and transferred to [facility] for further neurology workupand long-term EEG.  PMHx:  Acute on Chronic CHF, cerebral atrophy, COPD, COVID, DM2, HTN, OA, RA, seizure, Stroke, R TKA   Clinical Impression   At baseline, pt is a long term resident of SNF and requires assistance for all mobility and ADL tasks. Pt states staff use a hoyer to mobilize her and assist with all ADL tasks as needed. Pt most likely close to basleine. No acute OT needed. OT signing off.      Recommendations for follow up therapy are one component of a multi-disciplinary discharge planning process, led by the attending physician.  Recommendations may be updated based on patient status, additional functional criteria and insurance authorization.   Follow Up Recommendations  Long-term institutional care without follow-up therapy    Assistance Recommended at Discharge  Frequent  Patient can return home with the following      Functional Status Assessment     Equipment Recommendations   (none)    Recommendations for Other Services       Precautions / Restrictions Precautions Precautions: Fall      Mobility Bed Mobility Overal bed mobility: Needs Assistance Bed Mobility: Supine to Sit, Sit to Supine     Supine to sit: Max assist, +2 for physical assistance Sit to supine: Total assist, +2 for physical assistance        Transfers                   General transfer comment: NA      Balance                                           ADL either performed or assessed with clinical judgement   ADL Overall ADL's  : Needs assistance/impaired;At baseline                                             Vision         Perception     Praxis      Pertinent Vitals/Pain Pain Assessment Pain Assessment: Faces Faces Pain Scale: Hurts a little bit Pain Location: throat Pain Descriptors / Indicators: Discomfort Pain Intervention(s): Limited activity within patient's tolerance     Hand Dominance     Extremity/Trunk Assessment Upper Extremity Assessment Upper Extremity Assessment: Defer to OT evaluation   Lower Extremity Assessment Lower Extremity Assessment: RLE deficits/detail;LLE deficits/detail RLE Deficits / Details: SLR against gravity without assist, generally quads/hip flexors 3-/5, knee flexion ot 70 deg.  Overall significant general weakness RLE Coordination: decreased fine motor;decreased gross motor LLE Deficits / Details: limited knee PROM, significant weakness overall at 2+3-/5 LLE Coordination: decreased fine motor;decreased gross motor   Cervical / Trunk Assessment Cervical / Trunk Assessment: Kyphotic   Communication Communication Communication: Expressive difficulties   Cognition Arousal/Alertness: Awake/alert Behavior During Therapy: Anxious Overall Cognitive Status: No family/caregiver present to determine baseline cognitive functioning  General Comments: most likely cloase to baseline     General Comments  stridor type breathing however improved after return to supine    Exercises     Shoulder Instructions      Home Living Family/patient expects to be discharged to:: Skilled nursing facility                                 Additional Comments: pt questionable historian, is w/c level at baseline      Prior Functioning/Environment Prior Level of Function : Needs assist       Physical Assist : Mobility (physical);ADLs (physical) Mobility (physical): Bed mobility;Transfers    Mobility Comments: Patient is lifted to w/c from the bed.  Pt unable to self propel the w/c,  Pt needs significant assist for bed mobility. ADLs Comments: Pt needs help for all ADL's at the bed level.  Pt reports can feed herself (with set up)        OT Problem List: Decreased strength;Obesity      OT Treatment/Interventions:      OT Goals(Current goals can be found in the care plan section) Acute Rehab OT Goals Patient Stated Goal: for her throat to feel better OT Goal Formulation: All assessment and education complete, DC therapy  OT Frequency:      Co-evaluation PT/OT/SLP Co-Evaluation/Treatment: Yes Reason for Co-Treatment: For patient/therapist safety          AM-PAC OT "6 Clicks" Daily Activity     Outcome Measure Help from another person eating meals?: A Little Help from another person taking care of personal grooming?: A Little Help from another person toileting, which includes using toliet, bedpan, or urinal?: Total Help from another person bathing (including washing, rinsing, drying)?: A Lot Help from another person to put on and taking off regular upper body clothing?: A Lot Help from another person to put on and taking off regular lower body clothing?: Total 6 Click Score: 12   End of Session Equipment Utilized During Treatment: Oxygen Nurse Communication: Mobility status;Need for lift equipment (will need to use Maxisky)  Activity Tolerance: Patient limited by fatigue Patient left: in bed;with call bell/phone within reach;with bed alarm set;with SCD's reapplied  OT Visit Diagnosis: Other abnormalities of gait and mobility (R26.89);Muscle weakness (generalized) (M62.81)                Time: 7116-5790 OT Time Calculation (min): 23 min Charges:  OT General Charges $OT Visit: 1 Visit OT Evaluation $OT Eval Moderate Complexity: Dulce, OT/L   Acute OT Clinical Specialist Acute Rehabilitation Services Pager [phone] Office [phone]    [facility] 12/22/2021, 11:24 AM

## 2021-12-23 ENCOUNTER — Inpatient Hospital Stay (HOSPITAL_COMMUNITY): Payer: Medicare HMO

## 2021-12-23 DIAGNOSIS — R569 Unspecified convulsions: Secondary | ICD-10-CM | POA: Diagnosis not present

## 2021-12-23 LAB — GLUCOSE, CAPILLARY
Glucose-Capillary: 101 mg/dL — ABNORMAL HIGH (ref 70–99)
Glucose-Capillary: 118 mg/dL — ABNORMAL HIGH (ref 70–99)
Glucose-Capillary: 127 mg/dL — ABNORMAL HIGH (ref 70–99)
Glucose-Capillary: 150 mg/dL — ABNORMAL HIGH (ref 70–99)
Glucose-Capillary: 84 mg/dL (ref 70–99)
Glucose-Capillary: 96 mg/dL (ref 70–99)

## 2021-12-23 LAB — BASIC METABOLIC PANEL
Anion gap: 10 (ref 5–15)
BUN: 52 mg/dL — ABNORMAL HIGH (ref 8–23)
CO2: 28 mmol/L (ref 22–32)
Calcium: 9.1 mg/dL (ref 8.9–10.3)
Chloride: 105 mmol/L (ref 98–111)
Creatinine, Ser: 1.87 mg/dL — ABNORMAL HIGH (ref 0.44–1.00)
GFR, Estimated: 27 mL/min — ABNORMAL LOW (ref >60)
Glucose, Bld: 132 mg/dL — ABNORMAL HIGH (ref 70–99)
Potassium: 4.5 mmol/L (ref 3.5–5.1)
Sodium: 143 mmol/L (ref 135–145)

## 2021-12-23 MED ORDER — GABAPENTIN 100 MG PO CAPS
200.0000 mg | ORAL_CAPSULE | Freq: Every day | ORAL | Status: DC
  Administered 2021-12-23 – 2021-12-25 (×3): 200 mg via ORAL
  Filled 2021-12-23 (×3): qty 2

## 2021-12-23 MED ORDER — SENNOSIDES 8.8 MG/5ML PO SYRP
5.0000 mL | ORAL_SOLUTION | Freq: Every day | ORAL | Status: DC
  Administered 2021-12-23 – 2021-12-25 (×3): 5 mL via ORAL
  Filled 2021-12-23 (×4): qty 5

## 2021-12-23 MED ORDER — HYDROXYCHLOROQUINE SULFATE 200 MG PO TABS
200.0000 mg | ORAL_TABLET | Freq: Every day | ORAL | Status: DC
  Administered 2021-12-23 – 2021-12-26 (×3): 200 mg via ORAL
  Filled 2021-12-23 (×3): qty 1

## 2021-12-23 MED ORDER — AMLODIPINE BESYLATE 10 MG PO TABS
10.0000 mg | ORAL_TABLET | Freq: Every day | ORAL | Status: DC
  Administered 2021-12-25 – 2021-12-26 (×2): 10 mg via ORAL
  Filled 2021-12-23 (×3): qty 1

## 2021-12-23 MED ORDER — ACETAMINOPHEN 325 MG PO TABS
650.0000 mg | ORAL_TABLET | ORAL | Status: DC | PRN
  Administered 2021-12-23 – 2021-12-25 (×2): 650 mg via ORAL
  Filled 2021-12-23 (×2): qty 2

## 2021-12-23 MED ORDER — SERTRALINE HCL 50 MG PO TABS
50.0000 mg | ORAL_TABLET | Freq: Every day | ORAL | Status: DC
  Administered 2021-12-23 – 2021-12-26 (×3): 50 mg via ORAL
  Filled 2021-12-23 (×3): qty 1

## 2021-12-23 NOTE — Progress Notes (Signed)
CSW continuing to follow.   Sharona Rovner LCSW, MSW, MHA  

## 2021-12-23 NOTE — Progress Notes (Signed)
Brief Nutrition note  Spoke with MD about patients plan of care today. Per MD, pt with improved mentation this morning so plan for SLP to re-evaluate patient. However, per MD patient not a candidate for Cortrak or PEG for enteral nutrition. If patient were to fail and be recommended NPO again, would recommend palliative consult to discuss comfort feeds as appropriate. Will follow per protocol.   Samson Frederic RD, LDN For contact information, refer to [facility].

## 2021-12-23 NOTE — Progress Notes (Signed)
PT Cancellation Note  Patient Details Name: Katherine [name] MRN: [id number] DOB: February 10, 1941   Cancelled Treatment:    Reason Eval/Treat Not Completed: Other (comment). Please see PT eval on 11/09. Pt long term SNF resident that uses hoyer lift for mobility. No further PT recommended.    Shary Decamp [facility] 12/23/2021, 12:40 PM [place] Office [phone]

## 2021-12-23 NOTE — Progress Notes (Signed)
PROGRESS NOTE                                                                                                                                                                                                             Patient Demographics:    Katherine [name], is a 81 y.o. female, DOB - 17-Jun-1940, OIN:[id number]  Outpatient Primary MD for the patient is [facility], [place]    LOS - 6  Admit date - 12/17/2021    Chief Complaint  Patient presents with   Seizures   Respiratory Distress       Brief Narrative (HPI from H&P)   81 year old female followed by Palliative Care as out pt (was still full code prior to admission but now DNR) she resides at SNF, has history of CKD 3B, seizures, chronic CHF, angioedema, cerebral atrophy, COPD, DM type II, GERD, LVH.    At [facility] EMS called 11/4 when pt found unresponsive. Had witnessed seizure by EMS for which she was treated w/ IV midazolam. After this episode she was interactive and able to converse. Shortly after had another episode where she lost LOC. Required BMV. Intubated on arrival to ER at [facility]. Seen by Neuro. Transferred to [facility] for LTM.  At [facility] she was admitted by [facility], she was eventually extubated on 12/21/2021 postextubation had some stridor which was treated with steroids, she was also seen here in the ICU by neurology, she was stabilized and transferred out on 4 L nasal cannula oxygen to the floor on 12/23/2021.   11/4 Admitted to [facility] ED for seizure; intubated; transferring to [facility] for cEEG 11/5 On vent, sedated on versed 11/6 Failed PSV due to tachypnea, Tmax 99.5, Versed turned off, limited IV access  11/7 PSV wean, low volumes. Off sedation.  11/8 extubated. 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  She lives at an SNF. she was admitted to ICU briefly  intubated for airway protection, seen by PCCM and neurology, EEG shows encephalopathy but no acute seizures, on Neurontin, Keppra and Vimpat per neuro.  Encephalopathy improving.  Still mildly confused but no focal deficits.  Advance activity, speech PT OT eval and monitor.   History of COPD without any acute exacerbation, had brief episode of stridor after extubation likely due to laryngeal edema .  Roblin has resolved after she received steroids in the ICU, continue nasal cannula oxygen and monitor.  For her COPD she will continue combination of brovana, yupelri, pulmicort, PRN albuterol, she is on 2 to 3 L nasal cannula oxygen at baseline which will be continued.  Continue pulmonary hygiene.  History of CKD stage IV. Baseline creatinine around 1.9.  Monitor.  As needed Lasix for diuresis as needed.  History of chronic diastolic CHF last EF 36% 1 year ago.  Compensated.  As needed Lasix.    Hypertension.  Placed on Norvasc along with as needed hydralazine.    Rheumatoid arthritis.  Stable continue on Plaquenil.  Anemia of chronic disease.  No acute issues.  GERD.  On H2 blocker.  Anxiety and depression.  On sertraline.  DM type II.  On SSI.  Lab Results  Component Value Date   HGBA1C 5.4 12/19/2021   CBG (last 3)  Recent Labs    12/22/21 2329 12/23/21 0340 12/23/21 0749  GLUCAP 131* 127* 118*        Condition - Extremely Guarded  Family Communication  : None present  Code Status : DNR  Consults  : PCCM, neurology  PUD Prophylaxis : H2 blocker   Procedures  :            Disposition Plan  :    Status is: Inpatient  DVT Prophylaxis  :    heparin injection 5,000  Units Start: 12/17/21 2200    Lab Results  Component Value Date   PLT 177 12/22/2021    Diet :  Diet Order             Diet NPO time specified Except for: Ice Chips, Sips with Meds  Diet effective now                    Inpatient Medications  Scheduled Meds:  amLODipine  10 mg Oral Daily   arformoterol  15 mcg Nebulization BID   budesonide (PULMICORT) nebulizer solution  0.5 mg Nebulization BID   Chlorhexidine Gluconate Cloth  6 each Topical Daily   famotidine  10 mg Oral Daily   gabapentin  200 mg Oral QHS   heparin  5,000 Units Subcutaneous Q8H   hydroxychloroquine  200 mg Oral Daily   insulin aspart  0-9 Units Subcutaneous Q4H   mupirocin ointment  1 Application Nasal BID   revefenacin  175 mcg Nebulization Daily   sennosides  5 mL Oral QHS   sertraline  50 mg Oral Daily   sodium chloride flush  10-40 mL Intracatheter Q12H   Continuous Infusions:  lacosamide (VIMPAT) IV 100 mg (12/22/21 2338)   levETIRAcetam 500 mg (12/23/21 0107)   PRN Meds:.acetaminophen, albuterol, hydrALAZINE, LORazepam, mouth rinse, Racepinephrine HCl, sodium chloride flush    Objective:   Vitals:   12/23/21 0121 12/23/21 0155 12/23/21 0402 12/23/21 0800  BP:  104/86 (!) 141/88 (!) 172/84  Pulse: 79 82 78 82  Resp: (!) 22 19 20 20   Temp:  97.8 F (36.6 C) 98 F (36.7 C) 97.9 F (36.6 C)  TempSrc:  Oral  Oral Oral  SpO2: 94% 93% 94% 92%  Weight:      Height:        Wt Readings from Last 3 Encounters:  12/22/21 90 kg  12/16/21 105.9 kg  10/31/21 104.1 kg     Intake/Output Summary (Last 24 hours) at 12/23/2021 0911 Last data filed at 12/23/2021 0456 Gross per 24 hour  Intake 146.53 ml  Output 1100 ml  Net -953.47 ml     Physical Exam  Awake , mildly confused, No new F.N deficits,  [place].AT,PERRAL Supple Neck, No JVD,   Symmetrical Chest wall movement, Good air movement bilaterally, CTAB RRR,No Gallops,Rubs or new Murmurs,  +ve B.Sounds, Abd Soft, No tenderness,    No Cyanosis, Clubbing or edema       Data Review:    CBC Recent Labs  Lab 12/17/21 1547 12/17/21 2158 12/19/21 0115 12/19/21 0514 12/20/21 0439 12/21/21 0316 12/22/21 0401  WBC 5.5   < > 7.9 7.6 8.1 8.9 8.6  HGB 11.0*   < > 10.6* 11.7* 10.4* 10.3* 11.0*  HCT 35.7*   < > 33.3* 35.5* 31.9* 32.9* 35.9*  PLT 167   < > 157 148* 143* 154 177  MCV 94.7   < > 92.0 91.3 91.4 93.5 96.0  MCH 29.2   < > 29.3 30.1 29.8 29.3 29.4  MCHC 30.8   < > 31.8 33.0 32.6 31.3 30.6  RDW 13.1   < > 13.2 13.5 13.9 13.9 13.8  LYMPHSABS 1.6  --   --   --   --   --   --   MONOABS 0.5  --   --   --   --   --   --   EOSABS 0.2  --   --   --   --   --   --   BASOSABS 0.0  --   --   --   --   --   --    < > = values in this interval not displayed.    Electrolytes Recent Labs  Lab 12/17/21 1547 12/17/21 2158 12/18/21 1141 12/19/21 0115 12/19/21 0514 12/19/21 0828 12/19/21 1710 12/20/21 0439 12/21/21 0316 12/22/21 0401 12/23/21 0530  NA 141   < > 139 144 142  --   --  142 144 146* 143  K 5.7*   < > 4.4 4.0 3.7  --   --  3.9 3.8 5.1 4.5  CL 100  --  103 106 105  --   --  106 105 105 105  CO2 36*  --  27 26 24   --   --  27 27 30 28   GLUCOSE 121*  --  103* 133* 115*  --   --  141* 126* 134* 132*  BUN 21  --  15 26* 26*  --   --  35* 49* 50* 52*  CREATININE 1.77*  --  1.63* 1.67* 1.54*  --   --  1.77* 1.86* 1.99* 1.87*  CALCIUM 8.7*  --  8.6* 8.8* 8.7*  --   --  8.8* 9.1 9.3 9.1  AST 17  --   --   --   --   --   --   --  15  --   --   ALT 6  --   --   --   --   --   --   --  9  --   --   ALKPHOS 90  --   --   --   --   --   --   --  68  --   --   BILITOT 1.2  --   --   --   --   --   --   --  0.2*  --   --   ALBUMIN 3.2*  --   --   --   --   --   --   --  2.7*  --   --   MG  --   --  1.7 1.5* 1.7  --  2.6*  --   --   --   --   PROCALCITON  --   --   --  <0.10  --   --   --   --   --   --   --   LATICACIDVEN 1.3  --   --  4.5* 1.9 1.1  --   --   --   --   --   HGBA1C  --   --   --  5.4  --   --    --   --   --   --   --   AMMONIA  --   --  40*  --   --   --   --   --   --   --   --    < > = values in this interval not displayed.   ID Labs Recent Labs  Lab 12/17/21 1547 12/18/21 1141 12/19/21 0115 12/19/21 0514 12/19/21 0828 12/20/21 0439 12/21/21 0316 12/22/21 0401 12/23/21 0530  WBC 5.5   < > 7.9 7.6  --  8.1 8.9 8.6  --   PLT 167   < > 157 148*  --  143* 154 177  --   PROCALCITON  --   --  <0.10  --   --   --   --   --   --   LATICACIDVEN 1.3  --  4.5* 1.9 1.1  --   --   --   --   CREATININE 1.77*   < > 1.67* 1.54*  --  1.77* 1.86* 1.99* 1.87*   < > = values in this interval not displayed.    Radiology Reports DG CHEST PORT 1 VIEW  Result Date: 12/20/2021 CLINICAL DATA:  Pneumonia EXAM: PORTABLE CHEST 1 VIEW COMPARISON:  Previous studies including the examination of 12/19/2021 FINDINGS: Tip of endotracheal tube is 4.1 cm above the carina. Enteric tube is noted traversing the esophagus. Tip of PICC line is seen in superior vena cava. There are linear densities in both lower lung fields with slight interval improvement. There are no new focal pulmonary infiltrates. There is minimal blunting of lateral CP angles. There is no pneumothorax. IMPRESSION: There is slight interval improvement in the aeration of lower lung fields suggesting decrease in atelectasis/pneumonia. Electronically Signed   By: Elmer Picker M.D.   On: 12/20/2021 10:21   DG Abd Portable 1V  Result Date: 12/20/2021 CLINICAL DATA:  Enteric tube placement EXAM: PORTABLE ABDOMEN - 1 VIEW COMPARISON:  None Available. FINDINGS: Tip and side port of enteric tube are in the stomach. There are linear densities in the lower lung fields suggesting scarring or subsegmental atelectasis. Surgical clips are seen in right upper quadrant. Bowel gas pattern in the upper abdomen is unremarkable. Lower abdomen and pelvis are not included in the image. IMPRESSION: Tip of enteric tube is seen in the stomach. Electronically  Signed   By: Elmer Picker M.D.   On: 12/20/2021 10:19  DG CHEST PORT 1 VIEW  Result Date: 12/19/2021 CLINICAL DATA:  Respiratory failure, hypoxia EXAM: PORTABLE CHEST 1 VIEW COMPARISON:  Previous studies including the examination of 12/17/2021 FINDINGS: Transverse diameter of heart is increased. Tip of endotracheal tube is 2.3 cm above the carina. NG tube is noted traversing the esophagus. There is poor inspiration. There are linear densities in both lower lung fields with interval worsening. There are no signs of alveolar pulmonary edema. There is blunting of lateral CP angles. There is no pneumothorax. IMPRESSION: There are linear densities in both lower lung fields with interval worsening suggesting atelectasis/pneumonia. Minimal bilateral pleural effusions. Electronically Signed   By: Elmer Picker M.D.   On: 12/19/2021 11:59   Overnight EEG with video  Result Date: 12/19/2021 [name], MD     12/20/2021 10:43 AM Patient Name: Alessa Mazur MRN: [id number] Epilepsy Attending: [name] Referring Physician/Provider: Kerney Elbe, MD Duration: 12/18/2021 9458 to 12/18/2021 2017, 12/19/2021 0821 to 12/19/2021 1800 Patient history: 81 year old female with a history of seizures (on Keppra and Vimpat at home), prior strokes, CKD3, DM, HTN, COPD and frequent UTIs, with recurrent spells of unresponsiveness concerning for seizures. EEG to evaluate for seizure. Level of alertness:  lethargic /sedated AEDs during EEG study: LEV, LCM, Versed Technical aspects: This EEG study was done with scalp electrodes positioned according to the 10-20 International system of electrode placement. Electrical activity was reviewed with band pass filter of 1-70Hz , sensitivity of 7 uV/mm, display speed of 27mm/sec with a 60Hz  notched filter applied as appropriate. EEG data were recorded continuously and digitally stored.  Video monitoring was available and reviewed as appropriate. Description: EEG showed  continuous generalized 3 to 6 Hz theta-delta slowing admixed with 12-14hz  generalized beta activity. Hyperventilation and photic stimulation were not performed. Event button was pressed on 12/18/2021 at 1037 for lower lip quivering.  Concomitant EEG before, during and after the event did not show any EEG changes suggest seizure. Study was disconnected between 12/18/2021 2017 to 12/19/2021 5929 due to technical issues. ABNORMALITY - Continuous slow, generalized IMPRESSION: This study is suggestive of severe diffuse encephalopathy, nonspecific etiology but could be related to sedation. No seizures or epileptiform discharges were seen throughout the recording. [name]      Signature  Lala Lund M.D on 12/23/2021 at 9:11 AM   -  To page go to [email or web address]

## 2021-12-23 NOTE — Care Management Important Message (Signed)
Important Message  Patient Details  Name: Katherine [name] MRN: [id number] Date of Birth: [birth year]   Medicare Important Message Given:  Yes     Tradarius Reinwald Montine Circle 12/23/2021, 4:32 PM

## 2021-12-23 NOTE — Progress Notes (Signed)
Pt was evaluated yesterday by OT and found to not have any further skilled OT needs. Please refer to that note for details.  Golden Circle, OTR/L [facility] [phone] Office [phone]

## 2021-12-23 NOTE — Progress Notes (Signed)
Speech Language Pathology Treatment: Dysphagia  Patient Details Name: Katherine [name] MRN: [id number] DOB: 02-25-1940 Today's Date: 12/23/2021 Time: 0017-4944 SLP Time Calculation (min) (ACUTE ONLY): 15 min  Assessment / Plan / Recommendation Clinical Impression  Pt was seen for dysphagia treatment. She was alert and cooperative during the session and following SLP's inquiry about her use of dentures, pt reported "Girl, I can eat! I ain't got no problem chewing! I swallow good!". Pt tolerated regular texture solids and thin liquids via straw without symptoms of oropharyngeal dysphagia. Mastication was mildly prolonged due to pt's edentulous status, but is was functional and pt denied any difficulty with mastication or any change in swallow function. A regular texture diet with thin liquids is recommended at this time. SLP will follow pt briefly to ensure tolerance.     HPI HPI: 81 year old female with PMH of CKD, chronic CHF, angioedema, cerebral atrophy, status epilepticus, COPD, CVA, DM type 2, GERD, LVH. Pt was admitted to [facility] ED for seizure. The breakthrough seizure is in the setting of a UTI. Pt intubated 11/4-11/8. Previous admission pt was evaluated and Dys 3/thin liquid diet recommended once pt roused enough for POs.      SLP Plan  Continue with current plan of care      Recommendations for follow up therapy are one component of a multi-disciplinary discharge planning process, led by the attending physician.  Recommendations may be updated based on patient status, additional functional criteria and insurance authorization.    Recommendations  Diet recommendations: Regular;Thin liquid Liquids provided via: Cup;Straw Medication Administration: Crushed with puree (or whole with puree; per preference) Supervision: Patient able to self feed Compensations: Small sips/bites;Slow rate Postural Changes and/or Swallow Maneuvers: Seated upright 90 degrees                Oral Care  Recommendations: Oral care QID Follow Up Recommendations: Follow physician's recommendations for discharge plan and follow up therapies Assistance recommended at discharge: PRN SLP Visit Diagnosis: Dysphagia, pharyngeal phase (R13.13) Plan: Continue with current plan of care         Alvina Strother I. Hardin Negus, [place], [place] Office number [phone]   Horton Marshall  12/23/2021, 10:22 AM

## 2021-12-23 NOTE — Progress Notes (Signed)
CSW continuing to follow patient.  Gilmore Laroche, MSW, [facility]

## 2021-12-24 DIAGNOSIS — Z515 Encounter for palliative care: Secondary | ICD-10-CM | POA: Diagnosis not present

## 2021-12-24 DIAGNOSIS — R627 Adult failure to thrive: Secondary | ICD-10-CM

## 2021-12-24 DIAGNOSIS — R569 Unspecified convulsions: Secondary | ICD-10-CM | POA: Diagnosis not present

## 2021-12-24 DIAGNOSIS — G9340 Encephalopathy, unspecified: Secondary | ICD-10-CM | POA: Diagnosis not present

## 2021-12-24 LAB — CBC WITH DIFFERENTIAL/PLATELET
Abs Immature Granulocytes: 0.06 10*3/uL (ref 0.00–0.07)
Basophils Absolute: 0 10*3/uL (ref 0.0–0.1)
Basophils Relative: 0 %
Eosinophils Absolute: 0 10*3/uL (ref 0.0–0.5)
Eosinophils Relative: 0 %
HCT: 32.9 % — ABNORMAL LOW (ref 36.0–46.0)
Hemoglobin: 10.6 g/dL — ABNORMAL LOW (ref 12.0–15.0)
Immature Granulocytes: 1 %
Lymphocytes Relative: 27 %
Lymphs Abs: 1.9 10*3/uL (ref 0.7–4.0)
MCH: 29.9 pg (ref 26.0–34.0)
MCHC: 32.2 g/dL (ref 30.0–36.0)
MCV: 92.7 fL (ref 80.0–100.0)
Monocytes Absolute: 0.6 10*3/uL (ref 0.1–1.0)
Monocytes Relative: 9 %
Neutro Abs: 4.3 10*3/uL (ref 1.7–7.7)
Neutrophils Relative %: 63 %
Platelets: 186 10*3/uL (ref 150–400)
RBC: 3.55 MIL/uL — ABNORMAL LOW (ref 3.87–5.11)
RDW: 13.6 % (ref 11.5–15.5)
WBC: 6.9 10*3/uL (ref 4.0–10.5)
nRBC: 0 % (ref 0.0–0.2)

## 2021-12-24 LAB — BASIC METABOLIC PANEL
Anion gap: 8 (ref 5–15)
BUN: 48 mg/dL — ABNORMAL HIGH (ref 8–23)
CO2: 29 mmol/L (ref 22–32)
Calcium: 9 mg/dL (ref 8.9–10.3)
Chloride: 107 mmol/L (ref 98–111)
Creatinine, Ser: 1.98 mg/dL — ABNORMAL HIGH (ref 0.44–1.00)
GFR, Estimated: 25 mL/min — ABNORMAL LOW (ref >60)
Glucose, Bld: 87 mg/dL (ref 70–99)
Potassium: 3.9 mmol/L (ref 3.5–5.1)
Sodium: 144 mmol/L (ref 135–145)

## 2021-12-24 LAB — GLUCOSE, CAPILLARY
Glucose-Capillary: 70 mg/dL (ref 70–99)
Glucose-Capillary: 82 mg/dL (ref 70–99)
Glucose-Capillary: 83 mg/dL (ref 70–99)
Glucose-Capillary: 84 mg/dL (ref 70–99)
Glucose-Capillary: 81 mg/dL (ref 70–99)

## 2021-12-24 LAB — BRAIN NATRIURETIC PEPTIDE: B Natriuretic Peptide: 130.4 pg/mL — ABNORMAL HIGH (ref 0.0–100.0)

## 2021-12-24 LAB — CULTURE, BLOOD (ROUTINE X 2)
Culture: NO GROWTH
Culture: NO GROWTH
Special Requests: ADEQUATE
Special Requests: ADEQUATE

## 2021-12-24 LAB — MAGNESIUM: Magnesium: 2.5 mg/dL — ABNORMAL HIGH (ref 1.7–2.4)

## 2021-12-24 NOTE — Progress Notes (Deleted)
Patient refused to get Inj Heparin. States 'i don't want to be pricked,Get away". Attempted to give again after an hour but she refused again.Notified MD

## 2021-12-24 NOTE — Progress Notes (Signed)
Patient refused to get Inj Heparin. States 'I don't want to be pricked,Get away". Attempted to give again after an hour but she refused again.Notified MD

## 2021-12-24 NOTE — Consult Note (Signed)
Consultation Note Date: 12/24/2021   Patient Name: Katherine [name]  DOB: 13-Oct-1940  MRN: [id number]  Age / Sex: 81 y.o., female  PCP: Care, [place] Referring Physician: Thurnell Lose, MD  Reason for Consultation: Establishing goals of care and Psychosocial/spiritual support  HPI/Patient Profile: 81 y.o. female   admitted on 12/17/2021 with past medical history significant of CKD 3B, seizures, chronic CHF, angioedema, cerebral atrophy, COPD, DM type II, GERD, LVH.  Patient is a long-term resident of [facility], she has been seen by outpatient palliative in the past  At [facility] EMS called 11/4 when pt found unresponsive. Had witnessed seizure by EMS for which she was treated w/ IV midazolam. After this episode she was interactive and able to converse. Shortly after had another episode where she lost LOC. Required BMV. Intubated on arrival to ER at [facility]. Seen by Neuro.   Transferred to [facility] for LTM.  At [facility] she was admitted by [facility], she was eventually extubated on 12/21/2021 postextubation had some stridor which was treated with steroids, she was also seen here in the ICU by neurology, she was stabilized and transferred out on 4 L nasal cannula oxygen to the floor on 12/23/2021.     11/4 Admitted to [facility] ED for seizure; intubated; transferring to [facility] for cEEG 11/5 On vent, sedated on versed 11/6 Failed PSV due to tachypnea, Tmax 99.5, Versed turned off, limited IV access  11/7 PSV wean, low volumes. Off sedation.  11/8 extubated. Rcvd epi and steroids for stridor  11/10 transferred from ICU to hospitalist service  Patient does not have medical decision-making capacity at this time.  Her husband is next of kin and main decision maker.  Pending treatment option decision, advanced directive decisions and anticipatory care needs.   Clinical Assessment and Goals of Care:  This NP Wadie Lessen reviewed medical records, received report from team, assessed the patient and then meet at the patient's bedside and spoke to her husband/[name] by phone to discuss diagnosis, prognosis, GOC, EOL wishes disposition and options.  Patient is alert to person and place however she has little insight into her current medical situation.  Patient is high risk for decompensation  I also attempted to call daughter [name] but listed phone number is out of service   Discussed with husband the concept of Palliative Care was introduced as specialized medical care for people and their families living with serious illness.  If focuses on providing relief from the symptoms and stress of a serious illness.  The goal is to improve quality of life for both the patient and the family.  Created space and opportunity for husband  to explore thoughts and feelings regarding current medical situation.  [name] was difficult to keep focused on patient's medical situation, he continued to revert back on housing issues that he and his wife have had in the past.  Husband currently living with a friend.  He tells me his wife was placed in a facility 3 years ago because  they were evicted from their apartment. Husband verbalizes limited insight into his wife's medical condition and needs   A  discussion was had today regarding advanced directives.  Concepts specific to code status, artifical feeding and hydration, continued IV antibiotics and rehospitalization was had.    The difference between a aggressive medical intervention path  and a palliative comfort care path for this patient at this time was had.    Values and goals of care important to patient and family were attempted to be elicited.  Husband verbalizes openness to all offered and available medical interventions to prolong life, he is hopeful for improvement and for his wife's return to a skilled nursing facility      Questions and concerns  addressed.  Patient  encouraged to call with questions or concerns.     PMT will continue to support holistically.        No documented H POA or advanced care planning documents, patient's husband is next of kin and main decision maker   SUMMARY OF RECOMMENDATIONS    Code Status/Advance Care Planning: DNR   Palliative Prophylaxis:  Aspiration, Bowel Regimen, Delirium Protocol, Frequent Pain Assessment, and Oral Care  Additional Recommendations (Limitations, Scope, Preferences): Full Scope Treatment  Psycho-social/Spiritual:  Desire for further Chaplaincy support:no-declined   Prognosis:  Unable to determine  Discharge Planning: Back to SNF when medically stable , continue with outpatient palliative services,     Primary Diagnoses: Present on Admission: **None**   I have reviewed the medical record, interviewed the patient and family, and examined the patient. The following aspects are pertinent.  Past Medical History:  Diagnosis Date   Acute encephalopathy 02/11/2019   Acute on chronic anemia 01/03/2019   Acute on chronic congestive heart failure (HCC)    Acute respiratory failure with hypoxia and hypercapnia (HCC) 04/19/2021   Angioedema    Anxiety and depression    Aphasia 06/28/2017   Cerebral atrophy ([place]) 06/19/2021   Chest pain 07/15/2018   CHF (congestive heart failure) (HCC)    Chronic constipation    Chronic kidney disease, stage 4 (severe) ([place]) 06/19/2021   COPD (chronic obstructive pulmonary disease) ([place])    COPD exacerbation ([place]) 11/30/2017   COPD with acute exacerbation ([place]) 02/24/2018   COVID-19 12/21/2018   COVID-19 virus infection 12/21/2018   Delirium    Diabetes mellitus type 2, uncomplicated ([place]) 60/73/7106   Diabetes mellitus without complication (HCC)    Elevated rheumatoid factor 09/05/2017   Gallstones    GERD (gastroesophageal reflux disease)    Hematemesis 04/28/2018   Hemoptysis 12/20/2018   Hypertension    Influenza A  04/13/2018   Left knee pain 06/12/2016   Left ventricular hypertrophy    Localized edema 05/10/2018   Osteoarthritis    Pain in finger of left hand 09/12/2016   Pain in soft tissues of limb 05/20/2013   Formatting of this note might be different from the original. Last Assessment & Plan:  Handled by [facility] doctor; wearing boot   Palliative care encounter 05/10/2018   Possible Seizures ([place]) 10/08/2017   Pressure injury of skin 10/29/2017   Prurigo nodularis    Rheumatoid arthritis ([place])    on plaquenil   Right hand pain 03/21/2017   Seizures ([place])    Sepsis ([place]) 10/24/2017   Sepsis due to gram-negative UTI ([place]) 06/19/2019   Stroke ([place])    Stroke ([place]) 06/19/2019   Stroke-like episode ([place]) s/p IV tpa 10/04/2017   Tobacco abuse    Urinary tract  infection 11/22/2017   UTI (urinary tract infection) 08/21/2018   Vitamin D deficiency disease    Social History   Socioeconomic History   Marital status: Married    Spouse name: [name]    Number of children: 3   Years of education: 11   Highest education level: 11th grade  Occupational History   Occupation: retired  Tobacco Use   Smoking status: Former    Packs/day: 0.50    Years: 60.00    Total pack years: 30.00    Types: Cigarettes   Smokeless tobacco: Never   Tobacco comments:    quite 41mo ago-03/26/18  Vaping Use   Vaping Use: Never used  Substance and Sexual Activity   Alcohol use: No    Alcohol/week: 0.0 standard drinks of alcohol    Comment: rare   Drug use: No   Sexual activity: Not Currently  Other Topics Concern   Not on file  Social History Narrative   Not on file   Social Determinants of Health   Financial Resource Strain: Low Risk  (06/05/2017)   Overall Financial Resource Strain (CARDIA)    Difficulty of Paying Living Expenses: Not hard at all  Food Insecurity: No Food Insecurity (06/05/2017)   Hunger Vital Sign    Worried About Running Out of Food in the Last Year: Never true    [place] in  the Last Year: Never true  Transportation Needs: No Transportation Needs (06/05/2017)   PRAPARE - Hydrologist (Medical): No    Lack of Transportation (Non-Medical): No  Physical Activity: Inactive (11/14/2017)   Exercise Vital Sign    Days of Exercise per Week: 0 days    Minutes of Exercise per Session: 0 min  Stress: No Stress Concern Present (06/05/2017)   [place]    Feeling of Stress : Not at all  Social Connections: Moderately Integrated (11/19/2017)   Social Connection and Isolation Panel [NHANES]    Frequency of Communication with Friends and Family: More than three times a week    Frequency of Social Gatherings with Friends and Family: Once a week    Attends Religious Services: 1 to 4 times per year    Active Member of [employer] or Organizations: No    Attends Music therapist: Never    Marital Status: Married   Family History  Problem Relation Age of Onset   Alcohol abuse Mother    Sickle cell anemia Daughter    Hypertension Son    Cancer Neg Hx    COPD Neg Hx    Diabetes Neg Hx    Heart disease Neg Hx    Stroke Neg Hx    Scheduled Meds:  amLODipine  10 mg Oral Daily   arformoterol  15 mcg Nebulization BID   budesonide (PULMICORT) nebulizer solution  0.5 mg Nebulization BID   Chlorhexidine Gluconate Cloth  6 each Topical Daily   famotidine  10 mg Oral Daily   gabapentin  200 mg Oral QHS   heparin  5,000 Units Subcutaneous Q8H   hydroxychloroquine  200 mg Oral Daily   insulin aspart  0-9 Units Subcutaneous Q4H   mupirocin ointment  1 Application Nasal BID   revefenacin  175 mcg Nebulization Daily   sennosides  5 mL Oral QHS   sertraline  50 mg Oral Daily   sodium chloride flush  10-40 mL Intracatheter Q12H   Continuous Infusions:  lacosamide (  VIMPAT) IV 100 mg (12/23/21 2233)   levETIRAcetam 500 mg (12/23/21 2317)   PRN Meds:.acetaminophen, albuterol,  hydrALAZINE, LORazepam, mouth rinse, Racepinephrine HCl, sodium chloride flush Medications Prior to Admission:  Prior to Admission medications   Medication Sig Start Date End Date Taking? Authorizing Provider  Acetaminophen 325 MG CAPS Take 650 mg by mouth every 8 (eight) hours as needed (pain).   Yes [provider]  Fluticasone-Umeclidin-Vilant 100-62.5-25 MCG/INH AEPB Inhale 1 puff into the lungs in the morning.   Yes [provider]  gabapentin (NEURONTIN) 100 MG capsule Take 2 capsules (200 mg total) by mouth at bedtime. 06/21/21  Yes Ezequiel Essex, MD  guaiFENesin (MUCINEX) 600 MG 12 hr tablet Take 600 mg by mouth every 12 (twelve) hours as needed for to loosen phlegm (or for secretions).   Yes [provider]  hydroxychloroquine (PLAQUENIL) 200 MG tablet Take 200 mg by mouth See admin instructions. Take 200 mg by mouth at 5 PM daily 12/07/21  Yes [provider]  ipratropium-albuterol (DUONEB) 0.5-2.5 (3) MG/3ML SOLN Take 3 mLs by nebulization every 6 (six) hours as needed. Patient taking differently: Take 3 mLs by nebulization See admin instructions. Nebulize 3 ml's and inhale into the lungs every 6 hours and and an additional 3 ml's every 6 hours as needed for shortness of breath 06/21/21  Yes Ezequiel Essex, MD  lacosamide 100 MG TABS Take 1 tablet (100 mg total) by mouth 2 (two) times daily. 12/31/18  Yes Thurnell Lose, MD  levETIRAcetam (KEPPRA) 500 MG tablet Take 1 tablet (500 mg total) by mouth 2 (two) times daily. Patient taking differently: Take 500 mg by mouth every 12 (twelve) hours. 10/26/21 10/21/22 Yes Camara, Amadou, MD  lidocaine (LIDODERM) 5 % Place 1 patch onto the skin See admin instructions. Apply 1 patch to left knee topically at bedtime and remove in the morning 01/06/21  Yes [provider]  loratadine (CLARITIN) 10 MG tablet Take 10 mg by mouth daily at 6 PM.   Yes [provider]  OXYGEN Inhale 3 L/min into the  lungs continuous.   Yes [provider]  pantoprazole (PROTONIX) 20 MG tablet Take 20 mg by mouth daily before breakfast. 04/15/21  Yes [provider]  PRESCRIPTION MEDICATION See admin instructions. BiPAP- At bedtime   Yes [provider]  propranolol (INDERAL) 10 MG tablet Take 10 mg by mouth 2 (two) times daily. 04/12/21  Yes [provider]  sertraline (ZOLOFT) 50 MG tablet Take 1 tablet (50 mg total) by mouth daily. Patient taking differently: Take 50 mg by mouth in the morning. 06/22/21  Yes Ezequiel Essex, MD  Skin Protectants, Misc. (EUCERIN) cream Apply 1 application  topically See admin instructions. Apply to bilateral feet at bedtime   Yes [provider]  sodium chloride (OCEAN) 0.65 % SOLN nasal spray Place 3 sprays into both nostrils See admin instructions. Administer 3 sprays alternating nostrils three times a day for nasal dryness.   Yes [provider]   Allergies  Allergen Reactions   Bee Venom Swelling and Other (See Comments)    "Allergic," per MAR   Enalapril Maleate Swelling and Other (See Comments)    "Allergic," per MAR   Review of Systems  Unable to perform ROS: Mental status change    Physical Exam Cardiovascular:     Rate and Rhythm: Normal rate.  Skin:    General: Skin is warm and dry.  Neurological:     Mental Status: She  is alert.     Vital Signs: BP (!) 141/76 (BP Location: Left Arm)   Pulse 68   Temp 97.8 F (36.6 C) (Axillary)   Resp 20   Ht 5\' 1"  (1.549 m)   Wt 91.1 kg   SpO2 92%   BMI 37.95 kg/m  Pain Scale: 0-10   Pain Score: 0-No pain   SpO2: SpO2: 92 % O2 Device:SpO2: 92 % O2 Flow Rate: .O2 Flow Rate (L/min): 3 L/min  IO: Intake/output summary: No intake or output data in the 24 hours ending 12/24/21 1202  LBM: Last BM Date : 12/23/21 Baseline Weight: Weight: 106.5 kg Most recent weight: Weight: 91.1 kg     Palliative Assessment/Data: 30 %    Discussed with Dr. Candiss Norse and  bedside RN  Time In: 1100 Time Out: 1215 Time Total: 75 minutes Greater than 50%  of this time was spent counseling and coordinating care related to the above assessment and plan.  Signed by: Wadie Lessen, NP   Please contact Palliative Medicine Team phone at [phone] for questions and concerns.  For individual provider: See Shea Evans

## 2021-12-24 NOTE — Progress Notes (Signed)
Called to bedside of pt by Lattie Haw, RN of IV team due to IV pump turned off and unplugged from wall. Pt states that she did not turn off pump. Neither Lattie Haw or RN in room at time of IV pump being shut off.

## 2021-12-24 NOTE — Progress Notes (Signed)
PROGRESS NOTE                                                                                                                                                                                                             Patient Demographics:    Katherine [name], is a 81 y.o. female, DOB - 1940-05-12, RWE:[id number]  Outpatient Primary MD for the patient is Care, [place]    LOS - 7  Admit date - 12/17/2021    Chief Complaint  Patient presents with   Seizures   Respiratory Distress       Brief Narrative (HPI from H&P)   81 year old female followed by Palliative Care as out pt (was still full code prior to admission but now DNR) she resides at SNF, has history of CKD 3B, seizures, chronic CHF, angioedema, cerebral atrophy, COPD, DM type II, GERD, LVH.    At [facility] EMS called 11/4 when pt found unresponsive. Had witnessed seizure by EMS for which she was treated w/ IV midazolam. After this episode she was interactive and able to converse. Shortly after had another episode where she lost LOC. Required BMV. Intubated on arrival to ER at [facility]. Seen by Neuro. Transferred to [facility] for LTM.  At [facility] she was admitted by [facility], she was eventually extubated on 12/21/2021 postextubation had some stridor which was treated with steroids, she was also seen here in the ICU by neurology, she was stabilized and transferred out on 4 L nasal cannula oxygen to the floor on 12/23/2021.   11/4 Admitted to [facility] ED for seizure; intubated; transferring to [facility] for cEEG 11/5 On vent, sedated on versed 11/6 Failed PSV due to tachypnea, Tmax 99.5, Versed turned off, limited IV access  11/7 PSV wean, low volumes. Off sedation.  11/8 extubated. Rcvd epi and steroids for stridor  11/10 transferred from ICU to hospitalist service   Subjective:   Patient in bed sleeping does not want to be disturbed, says she wants to catch up on sleep, denies any  headache.   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  She lives at an SNF. she was admitted to ICU briefly intubated for airway protection, seen  by PCCM and neurology, EEG shows encephalopathy but no acute seizures, on Neurontin, Keppra and Vimpat per neuro.  Encephalopathy improving.  Still mildly confused but no focal deficits.  Advance activity, speech PT OT eval and monitor.  She remains noncompliant with oral medications was already being seen by palliative care and SNF, will reinvolve for goals of care.   History of COPD without any acute exacerbation, had brief episode of stridor after extubation likely due to laryngeal edema .  Roblin has resolved after she received steroids in the ICU, continue nasal cannula oxygen and monitor.  For her COPD she will continue combination of brovana, yupelri, pulmicort, PRN albuterol, she is on 2 to 3 L nasal cannula oxygen at baseline which will be continued.  Continue pulmonary hygiene.  History of CKD stage IV. Baseline creatinine around 1.9.  Monitor.  As needed Lasix for diuresis as needed.  History of chronic diastolic CHF last EF 34% 1 year ago.  Compensated.  As needed Lasix.    Hypertension.  Placed on Norvasc along with as needed hydralazine.    Rheumatoid arthritis.  Stable continue on Plaquenil.  Anemia of chronic disease.  No acute issues.  GERD.  On H2 blocker.  Anxiety and depression.  On sertraline.  DM type II.  On SSI.  Lab Results  Component Value Date   HGBA1C 5.4 12/19/2021   CBG (last 3)  Recent Labs    12/23/21 2344 12/24/21 0323 12/24/21 0819  GLUCAP 84 70 82        Condition - Extremely Guarded  Family Communication  : None present  Code Status : DNR  Consults  : PCCM, neurology  PUD Prophylaxis : H2 blocker   Procedures  :             Disposition Plan  :    Status is: Inpatient  DVT Prophylaxis  :    heparin injection 5,000 Units Start: 12/17/21 2200    Lab Results  Component Value Date   PLT 186 12/24/2021    Diet :  Diet Order             Diet regular Room service appropriate? No; Fluid consistency: Thin  Diet effective now                    Inpatient Medications  Scheduled Meds:  amLODipine  10 mg Oral Daily   arformoterol  15 mcg Nebulization BID   budesonide (PULMICORT) nebulizer solution  0.5 mg Nebulization BID   Chlorhexidine Gluconate Cloth  6 each Topical Daily   famotidine  10 mg Oral Daily   gabapentin  200 mg Oral QHS   heparin  5,000 Units Subcutaneous Q8H   hydroxychloroquine  200 mg Oral Daily   insulin aspart  0-9 Units Subcutaneous Q4H   mupirocin ointment  1 Application Nasal BID   revefenacin  175 mcg Nebulization Daily   sennosides  5 mL Oral QHS   sertraline  50 mg Oral Daily   sodium chloride flush  10-40 mL Intracatheter Q12H   Continuous Infusions:  lacosamide (VIMPAT) IV 100 mg (12/23/21 2233)   levETIRAcetam 500 mg (12/23/21 2317)   PRN Meds:.acetaminophen, albuterol, hydrALAZINE, LORazepam, mouth rinse, Racepinephrine HCl, sodium chloride flush    Objective:   Vitals:   12/24/21 0500 12/24/21 0505 12/24/21 0752 12/24/21 0800  BP:  139/75  (!) 141/76  Pulse:  66  68  Resp:  20  20  Temp:  98 F (36.7  C)  97.8 F (36.6 C)  TempSrc:  Oral  Axillary  SpO2:  93% 93% 92%  Weight: 91.1 kg     Height:        Wt Readings from Last 3 Encounters:  12/24/21 91.1 kg  12/16/21 105.9 kg  10/31/21 104.1 kg    No intake or output data in the 24 hours ending 12/24/21 0835    Physical Exam  Awake,mildly confused, No new F.N deficits,  Awake Alert, No new F.N deficits, Normal affect [place].AT,PERRAL Supple Neck, No JVD,   Symmetrical Chest wall movement, Good air movement bilaterally, CTAB RRR,No Gallops, Rubs or new Murmurs,  +ve B.Sounds, Abd Soft, No  tenderness,   No Cyanosis, Clubbing or edema        Data Review:    CBC Recent Labs  Lab 12/17/21 1547 12/17/21 2158 12/19/21 0514 12/20/21 0439 12/21/21 0316 12/22/21 0401 12/24/21 0339  WBC 5.5   < > 7.6 8.1 8.9 8.6 6.9  HGB 11.0*   < > 11.7* 10.4* 10.3* 11.0* 10.6*  HCT 35.7*   < > 35.5* 31.9* 32.9* 35.9* 32.9*  PLT 167   < > 148* 143* 154 177 186  MCV 94.7   < > 91.3 91.4 93.5 96.0 92.7  MCH 29.2   < > 30.1 29.8 29.3 29.4 29.9  MCHC 30.8   < > 33.0 32.6 31.3 30.6 32.2  RDW 13.1   < > 13.5 13.9 13.9 13.8 13.6  LYMPHSABS 1.6  --   --   --   --   --  1.9  MONOABS 0.5  --   --   --   --   --  0.6  EOSABS 0.2  --   --   --   --   --  0.0  BASOSABS 0.0  --   --   --   --   --  0.0   < > = values in this interval not displayed.    Electrolytes Recent Labs  Lab 12/17/21 1547 12/17/21 2158 12/18/21 1141 12/19/21 0115 12/19/21 0514 12/19/21 0828 12/19/21 1710 12/20/21 0439 12/21/21 0316 12/22/21 0401 12/23/21 0530 12/24/21 0339  NA 141   < > 139 144 142  --   --  142 144 146* 143 144  K 5.7*   < > 4.4 4.0 3.7  --   --  3.9 3.8 5.1 4.5 3.9  CL 100  --  103 106 105  --   --  106 105 105 105 107  CO2 36*  --  27 26 24   --   --  27 27 30 28 29   GLUCOSE 121*  --  103* 133* 115*  --   --  141* 126* 134* 132* 87  BUN 21  --  15 26* 26*  --   --  35* 49* 50* 52* 48*  CREATININE 1.77*  --  1.63* 1.67* 1.54*  --   --  1.77* 1.86* 1.99* 1.87* 1.98*  CALCIUM 8.7*  --  8.6* 8.8* 8.7*  --   --  8.8* 9.1 9.3 9.1 9.0  AST 17  --   --   --   --   --   --   --  15  --   --   --   ALT 6  --   --   --   --   --   --   --  9  --   --   --  ALKPHOS 90  --   --   --   --   --   --   --  68  --   --   --   BILITOT 1.2  --   --   --   --   --   --   --  0.2*  --   --   --   ALBUMIN 3.2*  --   --   --   --   --   --   --  2.7*  --   --   --   MG  --   --  1.7 1.5* 1.7  --  2.6*  --   --   --   --  2.5*  PROCALCITON  --   --   --  <0.10  --   --   --   --   --   --   --   --    LATICACIDVEN 1.3  --   --  4.5* 1.9 1.1  --   --   --   --   --   --   HGBA1C  --   --   --  5.4  --   --   --   --   --   --   --   --   AMMONIA  --   --  40*  --   --   --   --   --   --   --   --   --   BNP  --   --   --   --   --   --   --   --   --   --   --  130.4*   < > = values in this interval not displayed.   ID Labs Recent Labs  Lab 12/17/21 1547 12/18/21 1141 12/19/21 0115 12/19/21 0514 12/19/21 0828 12/20/21 0439 12/21/21 0316 12/22/21 0401 12/23/21 0530 12/24/21 0339  WBC 5.5   < > 7.9 7.6  --  8.1 8.9 8.6  --  6.9  PLT 167   < > 157 148*  --  143* 154 177  --  186  PROCALCITON  --   --  <0.10  --   --   --   --   --   --   --   LATICACIDVEN 1.3  --  4.5* 1.9 1.1  --   --   --   --   --   CREATININE 1.77*   < > 1.67* 1.54*  --  1.77* 1.86* 1.99* 1.87* 1.98*   < > = values in this interval not displayed.    Radiology Reports DG Chest Port 1 View  Result Date: 12/23/2021 CLINICAL DATA:  Shortness of breath. EXAM: PORTABLE CHEST 1 VIEW COMPARISON:  December 20, 2021. FINDINGS: Stable cardiomediastinal silhouette. Mild bibasilar subsegmental atelectasis or infiltrates are noted. Endotracheal and nasogastric tubes have been removed. Right-sided PICC line is again noted. Bony thorax is unremarkable. IMPRESSION: Stable bibasilar subsegmental atelectasis or infiltrates. Electronically Signed   By: Marijo Conception M.D.   On: 12/23/2021 09:27   DG CHEST PORT 1 VIEW  Result Date: 12/20/2021 CLINICAL DATA:  Pneumonia EXAM: PORTABLE CHEST 1 VIEW COMPARISON:  Previous studies including the examination of 12/19/2021 FINDINGS: Tip of endotracheal tube is 4.1 cm above the carina. Enteric tube is noted traversing the esophagus. Tip of PICC line is seen in superior vena  cava. There are linear densities in both lower lung fields with slight interval improvement. There are no new focal pulmonary infiltrates. There is minimal blunting of lateral CP angles. There is no pneumothorax.  IMPRESSION: There is slight interval improvement in the aeration of lower lung fields suggesting decrease in atelectasis/pneumonia. Electronically Signed   By: Elmer Picker M.D.   On: 12/20/2021 10:21   DG Abd Portable 1V  Result Date: 12/20/2021 CLINICAL DATA:  Enteric tube placement EXAM: PORTABLE ABDOMEN - 1 VIEW COMPARISON:  None Available. FINDINGS: Tip and side port of enteric tube are in the stomach. There are linear densities in the lower lung fields suggesting scarring or subsegmental atelectasis. Surgical clips are seen in right upper quadrant. Bowel gas pattern in the upper abdomen is unremarkable. Lower abdomen and pelvis are not included in the image. IMPRESSION: Tip of enteric tube is seen in the stomach. Electronically Signed   By: Elmer Picker M.D.   On: 12/20/2021 10:19      Signature  Lala Lund M.D on 12/24/2021 at 8:35 AM   -  To page go to [email or web address]

## 2021-12-25 DIAGNOSIS — R569 Unspecified convulsions: Secondary | ICD-10-CM | POA: Diagnosis not present

## 2021-12-25 LAB — CBC WITH DIFFERENTIAL/PLATELET
Abs Immature Granulocytes: 0.05 10*3/uL (ref 0.00–0.07)
Basophils Absolute: 0 10*3/uL (ref 0.0–0.1)
Basophils Relative: 0 %
Eosinophils Absolute: 0.2 10*3/uL (ref 0.0–0.5)
Eosinophils Relative: 3 %
HCT: 32 % — ABNORMAL LOW (ref 36.0–46.0)
Hemoglobin: 10.2 g/dL — ABNORMAL LOW (ref 12.0–15.0)
Immature Granulocytes: 1 %
Lymphocytes Relative: 39 %
Lymphs Abs: 2.4 10*3/uL (ref 0.7–4.0)
MCH: 29.6 pg (ref 26.0–34.0)
MCHC: 31.9 g/dL (ref 30.0–36.0)
MCV: 92.8 fL (ref 80.0–100.0)
Monocytes Absolute: 0.6 10*3/uL (ref 0.1–1.0)
Monocytes Relative: 9 %
Neutro Abs: 3 10*3/uL (ref 1.7–7.7)
Neutrophils Relative %: 48 %
Platelets: 172 10*3/uL (ref 150–400)
RBC: 3.45 MIL/uL — ABNORMAL LOW (ref 3.87–5.11)
RDW: 13.7 % (ref 11.5–15.5)
WBC: 6.1 10*3/uL (ref 4.0–10.5)
nRBC: 0.3 % — ABNORMAL HIGH (ref 0.0–0.2)

## 2021-12-25 LAB — BRAIN NATRIURETIC PEPTIDE: B Natriuretic Peptide: 73.2 pg/mL (ref 0.0–100.0)

## 2021-12-25 LAB — GLUCOSE, CAPILLARY
Glucose-Capillary: 101 mg/dL — ABNORMAL HIGH (ref 70–99)
Glucose-Capillary: 105 mg/dL — ABNORMAL HIGH (ref 70–99)
Glucose-Capillary: 111 mg/dL — ABNORMAL HIGH (ref 70–99)
Glucose-Capillary: 90 mg/dL (ref 70–99)
Glucose-Capillary: 94 mg/dL (ref 70–99)
Glucose-Capillary: 96 mg/dL (ref 70–99)
Glucose-Capillary: 96 mg/dL (ref 70–99)

## 2021-12-25 LAB — BASIC METABOLIC PANEL
Anion gap: 7 (ref 5–15)
BUN: 33 mg/dL — ABNORMAL HIGH (ref 8–23)
CO2: 25 mmol/L (ref 22–32)
Calcium: 7.5 mg/dL — ABNORMAL LOW (ref 8.9–10.3)
Chloride: 113 mmol/L — ABNORMAL HIGH (ref 98–111)
Creatinine, Ser: 1.46 mg/dL — ABNORMAL HIGH (ref 0.44–1.00)
GFR, Estimated: 36 mL/min — ABNORMAL LOW (ref >60)
Glucose, Bld: 76 mg/dL (ref 70–99)
Potassium: 3.3 mmol/L — ABNORMAL LOW (ref 3.5–5.1)
Sodium: 145 mmol/L (ref 135–145)

## 2021-12-25 LAB — MAGNESIUM: Magnesium: 2 mg/dL (ref 1.7–2.4)

## 2021-12-25 NOTE — Progress Notes (Signed)
PROGRESS NOTE                                                                                                                                                                                                             Patient Demographics:    Katherine [name], is a 81 y.o. female, DOB - [birth year], ZDG:[id number]  Outpatient Primary MD for the patient is Care, [place]    LOS - 8  Admit date - 12/17/2021    Chief Complaint  Patient presents with   Seizures   Respiratory Distress       Brief Narrative (HPI from H&P)   81 year old female followed by Palliative Care as out pt (was still full code prior to admission but now DNR) she resides at SNF, has history of CKD 3B, seizures, chronic CHF, angioedema, cerebral atrophy, COPD, DM type II, GERD, LVH.    At [facility] EMS called 11/4 when pt found unresponsive. Had witnessed seizure by EMS for which she was treated w/ IV midazolam. After this episode she was interactive and able to converse. Shortly after had another episode where she lost LOC. Required BMV. Intubated on arrival to ER at [facility]. Seen by Neuro. Transferred to [facility] for LTM.  At [facility] she was admitted by [facility], she was eventually extubated on 12/21/2021 postextubation had some stridor which was treated with steroids, she was also seen here in the ICU by neurology, she was stabilized and transferred out on 4 L nasal cannula oxygen to the floor on 12/23/2021.   11/4 Admitted to [facility] ED for seizure; intubated; transferring to [facility] for cEEG 11/5 On vent, sedated on versed 11/6 Failed PSV due to tachypnea, Tmax 99.5, Versed turned off, limited IV access  11/7 PSV wean, low volumes. Off sedation.  11/8 extubated. Rcvd epi and steroids for stridor  11/10 transferred from ICU to hospitalist service   Subjective:   Patient in bed, appears comfortable, denies any headache, no fever, no chest pain or pressure, no shortness of  breath , no abdominal pain. No new focal weakness.   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  She lives at an SNF. she was admitted to ICU  briefly intubated for airway protection, seen by PCCM and neurology, EEG shows encephalopathy but no acute seizures, on Neurontin, Keppra and Vimpat per neuro.  Encephalopathy improving.  Still mildly confused but no focal deficits.  Advance activity, speech PT OT eval and monitor.  She remains noncompliant with oral medications was already being seen by palliative care and SNF, will reinvolve for goals of care.   History of COPD without any acute exacerbation, had brief episode of stridor after extubation likely due to laryngeal edema .  Roblin has resolved after she received steroids in the ICU, continue nasal cannula oxygen and monitor.  For her COPD she will continue combination of brovana, yupelri, pulmicort, PRN albuterol, she is on 2 to 3 L nasal cannula oxygen at baseline which will be continued.  Continue pulmonary hygiene.  History of CKD stage IV. Baseline creatinine around 1.9.  Monitor.  As needed Lasix for diuresis as needed.  History of chronic diastolic CHF last EF 50% 1 year ago.  Compensated.  As needed Lasix.    Hypertension.  Placed on Norvasc along with as needed hydralazine.    Rheumatoid arthritis.  Stable continue on Plaquenil.  Anemia of chronic disease.  No acute issues.  GERD.  On H2 blocker.  Anxiety and depression.  On sertraline.  DM type II.  On SSI.  Lab Results  Component Value Date   HGBA1C 5.4 12/19/2021   CBG (last 3)  Recent Labs    12/24/21 2359 12/25/21 0329 12/25/21 0816  GLUCAP 96 90 94        Condition - Extremely Guarded  Family Communication  : None present  Code Status : DNR  Consults  : PCCM, neurology  PUD Prophylaxis : H2  blocker   Procedures  :            Disposition Plan  :    Status is: Inpatient  DVT Prophylaxis  :    heparin injection 5,000 Units Start: 12/17/21 2200    Lab Results  Component Value Date   PLT 172 12/25/2021    Diet :  Diet Order             Diet regular Room service appropriate? No; Fluid consistency: Thin  Diet effective now                    Inpatient Medications  Scheduled Meds:  amLODipine  10 mg Oral Daily   arformoterol  15 mcg Nebulization BID   budesonide (PULMICORT) nebulizer solution  0.5 mg Nebulization BID   Chlorhexidine Gluconate Cloth  6 each Topical Daily   famotidine  10 mg Oral Daily   gabapentin  200 mg Oral QHS   heparin  5,000 Units Subcutaneous Q8H   hydroxychloroquine  200 mg Oral Daily   insulin aspart  0-9 Units Subcutaneous Q4H   revefenacin  175 mcg Nebulization Daily   sennosides  5 mL Oral QHS   sertraline  50 mg Oral Daily   sodium chloride flush  10-40 mL Intracatheter Q12H   Continuous Infusions:  lacosamide (VIMPAT) IV 100 mg (12/24/21 2302)   levETIRAcetam Stopped (12/25/21 0035)   PRN Meds:.acetaminophen, albuterol, hydrALAZINE, LORazepam, mouth rinse, Racepinephrine HCl, sodium chloride flush    Objective:   Vitals:   12/25/21 0000 12/25/21 0313 12/25/21 0500 12/25/21 0728  BP: (!) 143/86 127/77    Pulse: 79 69    Resp: 20 19    Temp: 97.9 F (36.6 C) 97.6 F (36.4 C)  TempSrc: Oral Oral    SpO2: 93% 94%  98%  Weight:   102.4 kg   Height:        Wt Readings from Last 3 Encounters:  12/25/21 102.4 kg  12/16/21 105.9 kg  10/31/21 104.1 kg     Intake/Output Summary (Last 24 hours) at 12/25/2021 0818 Last data filed at 12/25/2021 0313 Gross per 24 hour  Intake 760 ml  Output --  Net 760 ml      Physical Exam  Awake but pleasantly confused, No new F.N deficits,   [place].AT,PERRAL Supple Neck, No JVD,   Symmetrical Chest wall movement, Good air movement bilaterally, CTAB RRR,No Gallops,  Rubs or new Murmurs,  +ve B.Sounds, Abd Soft, No tenderness,   No Cyanosis, Clubbing or edema       Data Review:    CBC Recent Labs  Lab 12/20/21 0439 12/21/21 0316 12/22/21 0401 12/24/21 0339 12/25/21 0413  WBC 8.1 8.9 8.6 6.9 6.1  HGB 10.4* 10.3* 11.0* 10.6* 10.2*  HCT 31.9* 32.9* 35.9* 32.9* 32.0*  PLT 143* 154 177 186 172  MCV 91.4 93.5 96.0 92.7 92.8  MCH 29.8 29.3 29.4 29.9 29.6  MCHC 32.6 31.3 30.6 32.2 31.9  RDW 13.9 13.9 13.8 13.6 13.7  LYMPHSABS  --   --   --  1.9 2.4  MONOABS  --   --   --  0.6 0.6  EOSABS  --   --   --  0.0 0.2  BASOSABS  --   --   --  0.0 0.0    Electrolytes Recent Labs  Lab 12/18/21 1141 12/19/21 0115 12/19/21 0514 12/19/21 0828 12/19/21 1710 12/20/21 0439 12/21/21 0316 12/22/21 0401 12/23/21 0530 12/24/21 0339 12/25/21 0413  NA 139 144 142  --   --    < > 144 146* 143 144 145  K 4.4 4.0 3.7  --   --    < > 3.8 5.1 4.5 3.9 3.3*  CL 103 106 105  --   --    < > 105 105 105 107 113*  CO2 27 26 24   --   --    < > 27 30 28 29 25   GLUCOSE 103* 133* 115*  --   --    < > 126* 134* 132* 87 76  BUN 15 26* 26*  --   --    < > 49* 50* 52* 48* 33*  CREATININE 1.63* 1.67* 1.54*  --   --    < > 1.86* 1.99* 1.87* 1.98* 1.46*  CALCIUM 8.6* 8.8* 8.7*  --   --    < > 9.1 9.3 9.1 9.0 7.5*  AST  --   --   --   --   --   --  15  --   --   --   --   ALT  --   --   --   --   --   --  9  --   --   --   --   ALKPHOS  --   --   --   --   --   --  68  --   --   --   --   BILITOT  --   --   --   --   --   --  0.2*  --   --   --   --   ALBUMIN  --   --   --   --   --   --  2.7*  --   --   --   --   MG 1.7 1.5* 1.7  --  2.6*  --   --   --   --  2.5* 2.0  PROCALCITON  --  <0.10  --   --   --   --   --   --   --   --   --   LATICACIDVEN  --  4.5* 1.9 1.1  --   --   --   --   --   --   --   HGBA1C  --  5.4  --   --   --   --   --   --   --   --   --   AMMONIA 40*  --   --   --   --   --   --   --   --   --   --   BNP  --   --   --   --   --   --   --   --    --  130.4* 73.2   < > = values in this interval not displayed.   ID Labs Recent Labs  Lab 12/19/21 0115 12/19/21 0514 12/19/21 0828 12/20/21 0439 12/21/21 0316 12/22/21 0401 12/23/21 0530 12/24/21 0339 12/25/21 0413  WBC 7.9 7.6  --  8.1 8.9 8.6  --  6.9 6.1  PLT 157 148*  --  143* 154 177  --  186 172  PROCALCITON <0.10  --   --   --   --   --   --   --   --   LATICACIDVEN 4.5* 1.9 1.1  --   --   --   --   --   --   CREATININE 1.67* 1.54*  --  1.77* 1.86* 1.99* 1.87* 1.98* 1.46*    Radiology Reports DG Chest Port 1 View  Result Date: 12/23/2021 CLINICAL DATA:  Shortness of breath. EXAM: PORTABLE CHEST 1 VIEW COMPARISON:  December 20, 2021. FINDINGS: Stable cardiomediastinal silhouette. Mild bibasilar subsegmental atelectasis or infiltrates are noted. Endotracheal and nasogastric tubes have been removed. Right-sided PICC line is again noted. Bony thorax is unremarkable. IMPRESSION: Stable bibasilar subsegmental atelectasis or infiltrates. Electronically Signed   By: Marijo Conception M.D.   On: 12/23/2021 09:27      Signature  Lala Lund M.D on 12/25/2021 at 8:18 AM   -  To page go to [email or web address]

## 2021-12-26 DIAGNOSIS — J9622 Acute and chronic respiratory failure with hypercapnia: Secondary | ICD-10-CM | POA: Diagnosis not present

## 2021-12-26 DIAGNOSIS — Z741 Need for assistance with personal care: Secondary | ICD-10-CM | POA: Diagnosis not present

## 2021-12-26 DIAGNOSIS — G319 Degenerative disease of nervous system, unspecified: Secondary | ICD-10-CM | POA: Diagnosis not present

## 2021-12-26 DIAGNOSIS — M6281 Muscle weakness (generalized): Secondary | ICD-10-CM | POA: Diagnosis not present

## 2021-12-26 DIAGNOSIS — J441 Chronic obstructive pulmonary disease with (acute) exacerbation: Secondary | ICD-10-CM | POA: Diagnosis not present

## 2021-12-26 DIAGNOSIS — I10 Essential (primary) hypertension: Secondary | ICD-10-CM | POA: Diagnosis not present

## 2021-12-26 DIAGNOSIS — R4789 Other speech disturbances: Secondary | ICD-10-CM | POA: Diagnosis not present

## 2021-12-26 DIAGNOSIS — E7800 Pure hypercholesterolemia, unspecified: Secondary | ICD-10-CM | POA: Diagnosis not present

## 2021-12-26 DIAGNOSIS — K219 Gastro-esophageal reflux disease without esophagitis: Secondary | ICD-10-CM | POA: Diagnosis not present

## 2021-12-26 DIAGNOSIS — G4733 Obstructive sleep apnea (adult) (pediatric): Secondary | ICD-10-CM | POA: Diagnosis not present

## 2021-12-26 DIAGNOSIS — M0520 Rheumatoid vasculitis with rheumatoid arthritis of unspecified site: Secondary | ICD-10-CM | POA: Diagnosis not present

## 2021-12-26 DIAGNOSIS — R6889 Other general symptoms and signs: Secondary | ICD-10-CM | POA: Diagnosis not present

## 2021-12-26 DIAGNOSIS — I7121 Aneurysm of the ascending aorta, without rupture: Secondary | ICD-10-CM | POA: Diagnosis not present

## 2021-12-26 DIAGNOSIS — R41841 Cognitive communication deficit: Secondary | ICD-10-CM | POA: Diagnosis not present

## 2021-12-26 DIAGNOSIS — R569 Unspecified convulsions: Secondary | ICD-10-CM | POA: Diagnosis not present

## 2021-12-26 DIAGNOSIS — E7849 Other hyperlipidemia: Secondary | ICD-10-CM | POA: Diagnosis not present

## 2021-12-26 DIAGNOSIS — F329 Major depressive disorder, single episode, unspecified: Secondary | ICD-10-CM | POA: Diagnosis not present

## 2021-12-26 DIAGNOSIS — J9601 Acute respiratory failure with hypoxia: Secondary | ICD-10-CM | POA: Diagnosis not present

## 2021-12-26 DIAGNOSIS — I5032 Chronic diastolic (congestive) heart failure: Secondary | ICD-10-CM | POA: Diagnosis not present

## 2021-12-26 DIAGNOSIS — E662 Morbid (severe) obesity with alveolar hypoventilation: Secondary | ICD-10-CM | POA: Diagnosis not present

## 2021-12-26 DIAGNOSIS — R5381 Other malaise: Secondary | ICD-10-CM | POA: Diagnosis not present

## 2021-12-26 DIAGNOSIS — G4089 Other seizures: Secondary | ICD-10-CM | POA: Diagnosis not present

## 2021-12-26 DIAGNOSIS — G40909 Epilepsy, unspecified, not intractable, without status epilepticus: Secondary | ICD-10-CM | POA: Diagnosis not present

## 2021-12-26 DIAGNOSIS — E1122 Type 2 diabetes mellitus with diabetic chronic kidney disease: Secondary | ICD-10-CM | POA: Diagnosis not present

## 2021-12-26 LAB — BASIC METABOLIC PANEL
Anion gap: 9 (ref 5–15)
BUN: 29 mg/dL — ABNORMAL HIGH (ref 8–23)
CO2: 28 mmol/L (ref 22–32)
Calcium: 8.8 mg/dL — ABNORMAL LOW (ref 8.9–10.3)
Chloride: 103 mmol/L (ref 98–111)
Creatinine, Ser: 1.66 mg/dL — ABNORMAL HIGH (ref 0.44–1.00)
GFR, Estimated: 31 mL/min — ABNORMAL LOW (ref >60)
Glucose, Bld: 98 mg/dL (ref 70–99)
Potassium: 4.1 mmol/L (ref 3.5–5.1)
Sodium: 140 mmol/L (ref 135–145)

## 2021-12-26 LAB — GLUCOSE, CAPILLARY
Glucose-Capillary: 85 mg/dL (ref 70–99)
Glucose-Capillary: 102 mg/dL — ABNORMAL HIGH (ref 70–99)
Glucose-Capillary: 106 mg/dL — ABNORMAL HIGH (ref 70–99)
Glucose-Capillary: 118 mg/dL — ABNORMAL HIGH (ref 70–99)

## 2021-12-26 LAB — CBC WITH DIFFERENTIAL/PLATELET
Abs Immature Granulocytes: 0.05 10*3/uL (ref 0.00–0.07)
Basophils Absolute: 0 10*3/uL (ref 0.0–0.1)
Basophils Relative: 0 %
Eosinophils Absolute: 0.3 10*3/uL (ref 0.0–0.5)
Eosinophils Relative: 5 %
HCT: 33.7 % — ABNORMAL LOW (ref 36.0–46.0)
Hemoglobin: 10.8 g/dL — ABNORMAL LOW (ref 12.0–15.0)
Immature Granulocytes: 1 %
Lymphocytes Relative: 40 %
Lymphs Abs: 2.7 10*3/uL (ref 0.7–4.0)
MCH: 30 pg (ref 26.0–34.0)
MCHC: 32 g/dL (ref 30.0–36.0)
MCV: 93.6 fL (ref 80.0–100.0)
Monocytes Absolute: 0.6 10*3/uL (ref 0.1–1.0)
Monocytes Relative: 8 %
Neutro Abs: 3.1 10*3/uL (ref 1.7–7.7)
Neutrophils Relative %: 46 %
Platelets: 167 10*3/uL (ref 150–400)
RBC: 3.6 MIL/uL — ABNORMAL LOW (ref 3.87–5.11)
RDW: 13.5 % (ref 11.5–15.5)
WBC: 6.8 10*3/uL (ref 4.0–10.5)
nRBC: 0.3 % — ABNORMAL HIGH (ref 0.0–0.2)

## 2021-12-26 LAB — MAGNESIUM: Magnesium: 2.2 mg/dL (ref 1.7–2.4)

## 2021-12-26 LAB — BRAIN NATRIURETIC PEPTIDE: B Natriuretic Peptide: 57.9 pg/mL (ref 0.0–100.0)

## 2021-12-26 NOTE — Progress Notes (Signed)
Report given to [facility], Therapist, sports at [employer] [phone].

## 2021-12-26 NOTE — TOC Initial Note (Signed)
Transition of Care [facility]) - Initial/Assessment Note    Patient Details  Name: Katherine [name] MRN: [id number] Date of Birth: Mar 02, 1940  [facility]) CM/SW Contact:    Benard Halsted, [place] Phone Number: 12/26/2021, 10:13 AM  Clinical Narrative:                 CSW made [place] aware that patient is ready for discharge today. CSW also spoke with patient's spouse who reported agreement with [facility] for transport.   Expected Discharge Plan: Skilled Nursing Facility Barriers to Discharge: Barriers Resolved   Patient Goals and CMS Choice Patient states their goals for this hospitalization and ongoing recovery are:: Return to snf CMS Medicare.gov Compare Post Acute Care list provided to:: Patient Represenative (must comment) Choice offered to / list presented to : Spouse  Expected Discharge Plan and Services Expected Discharge Plan: [place] In-house Referral: Clinical Social Work, Hospice / [place] Acute Care Choice: [place] Living arrangements for the past 2 months: [place] Expected Discharge Date: 12/26/21                                    Prior Living Arrangements/Services Living arrangements for the past 2 months: [place] Lives with:: Facility Resident Patient language and need for interpreter reviewed:: Yes Do you feel safe going back to the place where you live?: Yes      Need for Family Participation in Patient Care: Yes (Comment) Care giver support system in place?: Yes (comment)   Criminal Activity/Legal Involvement Pertinent to Current Situation/Hospitalization: No - Comment as needed  Activities of Daily Living      Permission Sought/Granted Permission sought to share information with : Facility Sport and exercise psychologist, Family Supports Permission granted to share information with : No  Share Information with NAME: [name]  Permission granted to share info  w AGENCY: [facility]  Permission granted to share info w Relationship: Spouse  Permission granted to share info w Contact Information: [phone]  Emotional Assessment Appearance:: Appears stated age Attitude/Demeanor/Rapport: Unable to Assess Affect (typically observed): Unable to Assess Orientation: : Oriented to Self, Oriented to Place Alcohol / Substance Use: Not Applicable Psych Involvement: No (comment)  Admission diagnosis:  Seizure ([place]) [R56.9] Patient Active Problem List   Diagnosis Date Noted   Stridor 12/22/2021   Breakthrough seizure ([place]) 12/22/2021   Seizure ([place]) 12/17/2021   Acute cystitis without hematuria 12/17/2021   Acute encephalopathy 12/17/2021   Acute respiratory failure with hypoxia ([place]) 12/17/2021   Status epilepticus ([place]) 12/17/2021   Cognitive and neurobehavioral dysfunction    Spells of speech arrest    Chronic kidney disease, stage 4 (severe) ([place]) 06/19/2021   Cerebral atrophy ([place]) 06/19/2021   Seizure-like activity ([place]) 06/18/2021   Type 2 diabetes mellitus with stage 4 chronic kidney disease ([place]) 12/28/2019   AAA (abdominal aortic aneurysm) ([place]) 06/19/2019   Iron deficiency anemia    Seizure disorder ([place]) 01/08/2019   Diabetes mellitus type 2, controlled, with complications ([place]) 92/33/0076   History of pulmonary embolism 11/21/2018   Chronic diastolic heart failure ([place]) 12/31/2017   Lymphedema 12/31/2017   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[place]) 11/16/2017   Hyperlipidemia associated with type 2 diabetes mellitus ([place]) 10/08/2017   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   Frequent hospital admissions 08/22/2017   Dry skin 03/21/2017   Chronic fatigue 06/12/2016  Goals of care, counseling/discussion 03/13/2016   Primary localized osteoarthritis of right knee 02/08/2016   OSA (obstructive sleep apnea) 09/16/2015   Rotator cuff syndrome 09/07/2015   Pulmonary scarring 07/27/2015   Sleep disturbance 04/14/2015    Coronary artery disease 03/14/2015   Polyp of vocal cord 03/14/2015   Hypertensive heart disease without congestive heart failure 20/60/1561   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   Anxiety    Tobacco abuse    Prurigo nodularis    GERD (gastroesophageal reflux disease)    COPD (chronic obstructive pulmonary disease) ([place])    Osteoarthritis    Vitamin D deficiency disease    Chronic constipation    Hypertension associated with diabetes ([place]) 05/20/2013   PCP:  Care, [place]:   [place] [facility] [place], [place] [address] [place] [address] Phone: [phone] Fax: [phone]     Social Determinants of Health (SDOH) Interventions    Readmission Risk Interventions    12/26/2021   10:12 AM 12/29/2019   10:19 AM 09/26/2019   10:54 AM  Readmission Risk Prevention Plan  Transportation Screening Complete Complete Complete  Medication Review Press photographer) Complete Complete Complete  PCP or Specialist appointment within 3-5 days of discharge Complete Not Complete   PCP/Specialist Appt Not Complete comments  Will return to SNF   [place] or [place] Complete Complete   SW Recovery Care/Counseling Consult Complete Complete Complete  Palliative Care Screening Complete Not Applicable Not Applicable  Skilled Nursing Facility Complete Complete Not Applicable

## 2021-12-26 NOTE — NC FL2 (Signed)
[place] LEVEL OF CARE SCREENING TOOL     IDENTIFICATION  Patient Name: Katherine [name] Birthdate: 12-31-1940 Sex: female Admission Date (Current Location): 12/17/2021  [facility] and [place] Number:  Engineering geologist and Address:  [facility] [place], [place] [address] [place], [place] [address]      Provider Number: [id number]  Attending Physician Name and Address:  Thurnell Lose, MD  Relative Name and Phone Number:       Current Level of Care: Hospital Recommended Level of Care: [place] Prior Approval Number:    Date Approved/Denied:   PASRR Number: [id number]  Discharge Plan: SNF    Current Diagnoses: Patient Active Problem List   Diagnosis Date Noted   Stridor 12/22/2021   Breakthrough seizure ([place]) 12/22/2021   Seizure ([place]) 12/17/2021   Acute cystitis without hematuria 12/17/2021   Acute encephalopathy 12/17/2021   Acute respiratory failure with hypoxia ([place]) 12/17/2021   Status epilepticus ([place]) 12/17/2021   Cognitive and neurobehavioral dysfunction    Spells of speech arrest    Chronic kidney disease, stage 4 (severe) ([place]) 06/19/2021   Cerebral atrophy ([place]) 06/19/2021   Seizure-like activity ([place]) 06/18/2021   Type 2 diabetes mellitus with stage 4 chronic kidney disease ([place]) 12/28/2019   AAA (abdominal aortic aneurysm) ([place]) 06/19/2019   Iron deficiency anemia    Seizure disorder ([place]) 01/08/2019   Diabetes mellitus type 2, controlled, with complications ([place]) 48/18/5631   History of pulmonary embolism 11/21/2018   Chronic diastolic heart failure ([place]) 12/31/2017   Lymphedema 12/31/2017   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[place]) 11/16/2017   Hyperlipidemia associated with type 2 diabetes mellitus ([place]) 10/08/2017   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   Frequent hospital admissions 08/22/2017   Dry skin 03/21/2017   Chronic fatigue 06/12/2016   Goals of care,  counseling/discussion 03/13/2016   Primary localized osteoarthritis of right knee 02/08/2016   OSA (obstructive sleep apnea) 09/16/2015   Rotator cuff syndrome 09/07/2015   Pulmonary scarring 07/27/2015   Sleep disturbance 04/14/2015   Coronary artery disease 03/14/2015   Polyp of vocal cord 03/14/2015   Hypertensive heart disease without congestive heart failure 49/70/2637   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   Anxiety    Tobacco abuse    Prurigo nodularis    GERD (gastroesophageal reflux disease)    COPD (chronic obstructive pulmonary disease) (HCC)    Osteoarthritis    Vitamin D deficiency disease    Chronic constipation    Hypertension associated with diabetes ([place]) 05/20/2013    Orientation RESPIRATION BLADDER Height & Weight     Self, Place  O2 (2L nasal cannula) Incontinent, External catheter Weight: 227 lb 4.7 oz (103.1 kg) Height:  5\' 1"  (154.9 cm)  BEHAVIORAL SYMPTOMS/MOOD NEUROLOGICAL BOWEL NUTRITION STATUS      Incontinent Diet (See dc summary)  AMBULATORY STATUS COMMUNICATION OF NEEDS Skin   Extensive Assist Verbally                         Personal Care Assistance Level of Assistance  Bathing, Feeding, Dressing Bathing Assistance: Maximum assistance Feeding assistance: Maximum assistance Dressing Assistance: Maximum assistance     Functional Limitations Info             SPECIAL CARE FACTORS FREQUENCY                       Contractures Contractures Info: Not present  Additional Factors Info  Code Status, Allergies, Psychotropic, Insulin Sliding Scale Code Status Info: DNR Allergies Info: Bee Venom, Enalapril Maleate Psychotropic Info: Zoloft Insulin Sliding Scale Info: see dc summary       Current Medications (12/26/2021):  This is the current hospital active medication list Current Facility-Administered Medications  Medication Dose Route Frequency Provider Last Rate Last Admin   acetaminophen (TYLENOL) tablet 650 mg  650 mg  Oral Q4H PRN Thurnell Lose, MD   650 mg at 12/25/21 0922   albuterol (PROVENTIL) (2.5 MG/3ML) 0.083% nebulizer solution 2.5 mg  2.5 mg Nebulization Q2H PRN Andres Labrum D, PA-C   2.5 mg at 12/23/21 0120   amLODipine (NORVASC) tablet 10 mg  10 mg Oral Daily Thurnell Lose, MD   10 mg at 12/26/21 0930   arformoterol (BROVANA) nebulizer solution 15 mcg  15 mcg Nebulization BID Mick Sell, PA-C   15 mcg at 12/26/21 0804   budesonide (PULMICORT) nebulizer solution 0.5 mg  0.5 mg Nebulization BID Noe Gens L, NP   0.5 mg at 12/26/21 0355   Chlorhexidine Gluconate Cloth 2 % PADS 6 each  6 each Topical Daily Amie Portland, MD   6 each at 12/26/21 0931   famotidine (PEPCID) tablet 10 mg  10 mg Oral Daily Ollis, Brandi L, NP   10 mg at 12/26/21 0930   gabapentin (NEURONTIN) capsule 200 mg  200 mg Oral QHS Lala Lund K, MD   200 mg at 12/25/21 2140   heparin injection 5,000 Units  5,000 Units Subcutaneous Q8H Mick Sell, PA-C   5,000 Units at 12/26/21 9741   hydrALAZINE (APRESOLINE) injection 10 mg  10 mg Intravenous Q4H PRN Maryjane Hurter, MD       hydroxychloroquine (PLAQUENIL) tablet 200 mg  200 mg Oral Daily Lala Lund K, MD   200 mg at 12/26/21 0930   insulin aspart (novoLOG) injection 0-9 Units  0-9 Units Subcutaneous Q4H Andres Labrum D, PA-C   1 Units at 12/23/21 1250   lacosamide (VIMPAT) 100 mg in sodium chloride 0.9 % 25 mL IVPB  100 mg Intravenous Q12H Amie Portland, MD 70 mL/hr at 12/25/21 2140 100 mg at 12/25/21 2140   levETIRAcetam (KEPPRA) IVPB 500 mg/100 mL premix  500 mg Intravenous Q12H Anders Simmonds, MD   Stopped at 12/26/21 0008   LORazepam (ATIVAN) injection 2 mg  2 mg Intravenous PRN Donita Brooks, NP       Oral care mouth rinse  15 mL Mouth Rinse PRN Hunsucker, Bonna Gains, MD       Racepinephrine HCl 2.25 % nebulizer solution 0.5 mL  0.5 mL Nebulization Q2H PRN Corey Harold, NP       revefenacin (YUPELRI) nebulizer solution 175 mcg  175 mcg  Nebulization Daily Mick Sell, PA-C   175 mcg at 12/26/21 0806   sennosides (SENOKOT) 8.8 MG/5ML syrup 5 mL  5 mL Oral QHS Thurnell Lose, MD   5 mL at 12/25/21 2140   sertraline (ZOLOFT) tablet 50 mg  50 mg Oral Daily Lala Lund K, MD   50 mg at 12/26/21 0930   sodium chloride flush (NS) 0.9 % injection 10-40 mL  10-40 mL Intracatheter Q12H Hunsucker, Bonna Gains, MD   20 mL at 12/26/21 0931   sodium chloride flush (NS) 0.9 % injection 10-40 mL  10-40 mL Intracatheter PRN Hunsucker, Bonna Gains, MD         Discharge Medications: Please see discharge  summary for a list of discharge medications.  Relevant Imaging Results:  Relevant Lab Results:   Additional Information SSN: [id number]  [place], LCSW

## 2021-12-26 NOTE — Discharge Instructions (Addendum)
Follow with Primary MD Care, [place] in 7 days   Get CBC, CMP, 2 view Chest X ray -  checked next visit with your primary MD or SNF MD    Activity: As tolerated with Full fall precautions use walker/cane & assistance as needed  Disposition SNF  Diet: Heart Healthy, low carbohydrate diet.  Check CBGs q. [place].  Special Instructions: If you have smoked or chewed Tobacco  in the last 2 yrs please stop smoking, stop any regular Alcohol  and or any Recreational drug use.  On your next visit with your primary care physician please Get Medicines reviewed and adjusted.  Please request your Prim.MD to go over all Hospital Tests and Procedure/Radiological results at the follow up, please get all Hospital records sent to your Prim MD by signing hospital release before you go home.  If you experience worsening of your admission symptoms, develop shortness of breath, life threatening emergency, suicidal or homicidal thoughts you must seek medical attention immediately by calling 911 or calling your MD immediately  if symptoms less severe.  You Must read complete instructions/literature along with all the possible adverse reactions/side effects for all the Medicines you take and that have been prescribed to you. Take any new Medicines after you have completely understood and accpet all the possible adverse reactions/side effects.

## 2021-12-26 NOTE — TOC Transition Note (Signed)
Transition of Care [facility]) - CM/SW Discharge Note   Patient Details  Name: Katherine [name] MRN: [id number] Date of Birth: [birth year]  Transition [facility]) CM/SW Contact:  Benard Halsted, LCSW Phone Number: 12/26/2021, 2:27 PM   Clinical Narrative:    Patient will DC to: [facility] Anticipated DC date: 12/26/21 Family notified: [place] PTARe Transport by:   Per MD patient ready for DC to [facility]. RN to call report prior to discharge ([phone]). RN, patient, patient's family, and facility notified of DC. Discharge Summary and FL2 sent to facility. DC packet on chart including signed DNR. Ambulance transport requested for patient.   CSW will sign off for now as social work intervention is no longer needed. Please consult [place] again if new needs arise.     Final next level of care: Skilled Nursing Facility Barriers to Discharge: Barriers Resolved   Patient Goals and CMS Choice Patient states their goals for this hospitalization and ongoing recovery are:: Return to snf CMS Medicare.gov Compare Post Acute Care list provided to:: Patient Represenative (must comment) Choice offered to / list presented to : Spouse  Discharge Placement   Existing PASRR number confirmed : 12/26/21          Patient [facility] bed at: [facility] Patient to be transferred to facility by: [place] Name of family member notified: Spouse Patient and family notified of of transfer: 12/26/21  Discharge Plan and Services In-house Referral: Clinical Social Work, Hospice / [place] Acute Care Choice: [place]                               Social Determinants of Health (SDOH) Interventions     Readmission Risk Interventions    12/26/2021   10:12 AM 12/29/2019   10:19 AM 09/26/2019   10:54 AM  Readmission Risk Prevention Plan  Transportation Screening Complete Complete Complete  Medication Review Press photographer) Complete Complete  Complete  PCP or Specialist appointment within 3-5 days of discharge Complete Not Complete   PCP/Specialist Appt Not Complete comments  Will return to SNF   [place] or [place] Complete Complete   SW Recovery Care/Counseling Consult Complete Complete Complete  Palliative Care Screening Complete Not Applicable Not [place] Complete Complete Not Applicable

## 2021-12-26 NOTE — Discharge Summary (Signed)
Katherine [name] BSJ:[id number] DOB: 1941/01/25 DOA: 12/17/2021  PCP: Care, [place] date: 12/17/2021  Discharge date: 12/26/2021  Admitted From: SNF   Disposition:  SNF   Recommendations for Outpatient Follow-up:   Follow up with PCP in 1-2 weeks  PCP Please obtain BMP/CBC, 2 view CXR in 1week,  (see Discharge instructions)   PCP Please follow up on the following pending results: He is monitor compliance with her antiseizure medications closely.   Home Health: None   Equipment/Devices: None  Consultations: PCCM, Neuro Discharge Condition: Stable    CODE STATUS: DNR  Diet Recommendation: Heart Healthy Low Carb  Diet Order             Diet regular Room service appropriate? No; Fluid consistency: Thin  Diet effective now                    Chief Complaint  Patient presents with   Seizures   Respiratory Distress     Brief history of present illness from the day of admission and additional interim summary    81 year old female followed by Palliative Care as out pt (was still full code prior to admission but now DNR) she resides at SNF, has history of CKD 3B, seizures, chronic CHF, angioedema, cerebral atrophy, COPD, DM type II, GERD, LVH.     At [facility] EMS called 11/4 when pt found unresponsive. Had witnessed seizure by EMS for which she was treated w/ IV midazolam. After this episode she was interactive and able to converse. Shortly after had another episode where she lost LOC. Required BMV. Intubated on arrival to ER at [facility]. Seen by Neuro. Transferred to [facility] for LTM.  At [facility] she was admitted by [facility], she was eventually extubated on 12/21/2021 postextubation had some stridor which was treated with steroids, she was also seen here in the ICU by neurology, she was stabilized and  transferred out on 4 L nasal cannula oxygen to the floor on 12/23/2021.     11/4 Admitted to [facility] ED for seizure; intubated; transferring to [facility] for cEEG 11/5 On vent, sedated on versed 11/6 Failed PSV due to tachypnea, Tmax 99.5, Versed turned off, limited IV access  11/7 PSV wean, low volumes. Off sedation.  11/8 extubated. 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  She lives at an SNF. she was admitted to ICU briefly intubated for airway protection, seen by [facility]  and neurology, EEG shows encephalopathy but no acute seizures, on Neurontin, Keppra and Vimpat per neuro.  Encephalopathy improving.  Still mildly confused but no focal deficits.  Advance activity, speech PT OT eval and monitor.  She remains noncompliant with oral medications was already being seen by palliative care and SNF, monitoring compliance with antiseizure medications at SNF.   History of COPD without any acute exacerbation, had brief episode of stridor after extubation likely due to laryngeal edema .  [place]lin has resolved after she received steroids in the ICU, continue nasal cannula oxygen and monitor.  For her COPD she will continue combination of brovana, yupelri, pulmicort, PRN albuterol, she is on 2 to 3 L nasal cannula oxygen at baseline which will be continued.  Continue pulmonary hygiene with incentive spirometer every hour at SNF, please encourage her to set up in recliner in the daytime.   History of CKD stage IV. Baseline creatinine around 1.9. Stable.   History of chronic diastolic CHF last EF 57% 1 year ago.  Compensated.  As needed Lasix.     Hypertension.  stable, monitor at SNF    Rheumatoid arthritis.   Stable continue on Plaquenil.   Anemia of chronic disease.  No acute issues.   GERD.  On H2 blocker.   Anxiety and depression.  On sertraline.   DM type II.  diet controlled   Discharge diagnosis     Principal Problem:   Seizure ([place]) Active Problems:   Acute cystitis without hematuria   Acute encephalopathy   Acute respiratory failure with hypoxia ([place])   Status epilepticus (HCC)   Stridor   Breakthrough seizure [facility])    Discharge instructions    Discharge Instructions     Discharge instructions   Complete by: As directed    Follow with Primary MD Care, [place] in 7 days   Get CBC, CMP, 2 view Chest X ray -  checked next visit with your primary MD or SNF MD    Activity: As tolerated with Full fall precautions use walker/cane & assistance as needed  Disposition SNF  Diet: Heart Healthy, low carbohydrate diet.  Check CBGs q. Post.  Special Instructions: If you have smoked or chewed Tobacco  in the last 2 yrs please stop smoking, stop any regular Alcohol  and or any Recreational drug use.  On your next visit with your primary care physician please Get Medicines reviewed and adjusted.  Please request your Prim.MD to go over all Hospital Tests and Procedure/Radiological results at the follow up, please get all Hospital records sent to your Prim MD by signing hospital release before you go home.  If you experience worsening of your admission symptoms, develop shortness of breath, life threatening emergency, suicidal or homicidal thoughts you must seek medical attention immediately by calling 911 or calling your MD immediately  if symptoms less severe.  You Must read complete instructions/literature along with all the possible adverse reactions/side effects for all the Medicines you take and that have been prescribed to you. Take any new Medicines after you have completely understood and accpet all the possible adverse reactions/side effects.   Increase activity  slowly   Complete by: As directed        Discharge Medications   Allergies as of 12/26/2021       Reactions   Bee Venom Swelling, Other (See Comments)   "Allergic," per MAR   Enalapril Maleate Swelling, Other (See Comments)   "Allergic," per [facility]  Medication List     TAKE these medications    Acetaminophen 325 MG Caps Take 650 mg by mouth every 8 (eight) hours as needed (pain).   eucerin cream Apply 1 application  topically See admin instructions. Apply to bilateral feet at bedtime   Fluticasone-Umeclidin-Vilant 100-62.5-25 MCG/INH Aepb Inhale 1 puff into the lungs in the morning.   gabapentin 100 MG capsule Commonly known as: NEURONTIN Take 2 capsules (200 mg total) by mouth at bedtime.   guaiFENesin 600 MG 12 hr tablet Commonly known as: MUCINEX Take 600 mg by mouth every 12 (twelve) hours as needed for to loosen phlegm (or for secretions).   hydroxychloroquine 200 MG tablet Commonly known as: PLAQUENIL Take 200 mg by mouth See admin instructions. Take 200 mg by mouth at 5 PM daily   ipratropium-albuterol 0.5-2.5 (3) MG/3ML Soln Commonly known as: DUONEB Take 3 mLs by nebulization every 6 (six) hours as needed. What changed:  when to take this additional instructions   Lacosamide 100 MG Tabs Take 1 tablet (100 mg total) by mouth 2 (two) times daily.   levETIRAcetam 500 MG tablet Commonly known as: KEPPRA Take 1 tablet (500 mg total) by mouth 2 (two) times daily. What changed: when to take this   lidocaine 5 % Commonly known as: LIDODERM Place 1 patch onto the skin See admin instructions. Apply 1 patch to left knee topically at bedtime and remove in the morning   loratadine 10 MG tablet Commonly known as: CLARITIN Take 10 mg by mouth daily at 6 PM.   OXYGEN Inhale 3 L/min into the lungs continuous.   pantoprazole 20 MG tablet Commonly known as: PROTONIX Take 20 mg by mouth daily before breakfast.   PRESCRIPTION MEDICATION See admin  instructions. BiPAP- At bedtime   propranolol 10 MG tablet Commonly known as: INDERAL Take 10 mg by mouth 2 (two) times daily.   sertraline 50 MG tablet Commonly known as: ZOLOFT Take 1 tablet (50 mg total) by mouth daily. What changed: when to take this   sodium chloride 0.65 % Soln nasal spray Commonly known as: OCEAN Place 3 sprays into both nostrils See admin instructions. Administer 3 sprays alternating nostrils three times a day for nasal dryness.         Follow-up Information     Care, [place]. Schedule an appointment as soon as possible for a visit in 1 week(s).   Specialty: [place] information: [place] [address] [phone]                 Major procedures and Radiology Reports - PLEASE review detailed and final reports thoroughly  -     DG Chest [facility] 1 View  Result Date: 12/23/2021 CLINICAL DATA:  Shortness of breath. EXAM: PORTABLE CHEST 1 VIEW COMPARISON:  December 20, 2021. FINDINGS: Stable cardiomediastinal silhouette. Mild bibasilar subsegmental atelectasis or infiltrates are noted. Endotracheal and nasogastric tubes have been removed. Right-sided PICC line is again noted. Bony thorax is unremarkable. IMPRESSION: Stable bibasilar subsegmental atelectasis or infiltrates. Electronically Signed   By: Marijo Conception M.D.   On: 12/23/2021 09:27   DG CHEST PORT 1 VIEW  Result Date: 12/20/2021 CLINICAL DATA:  Pneumonia EXAM: PORTABLE CHEST 1 VIEW COMPARISON:  Previous studies including the examination of 12/19/2021 FINDINGS: Tip of endotracheal tube is 4.1 cm above the carina. Enteric tube is noted traversing the esophagus. Tip of PICC line is seen in superior vena cava. There are linear densities  in both lower lung fields with slight interval improvement. There are no new focal pulmonary infiltrates. There is minimal blunting of lateral CP angles. There is no pneumothorax. IMPRESSION: There is slight interval  improvement in the aeration of lower lung fields suggesting decrease in atelectasis/pneumonia. Electronically Signed   By: Elmer Picker M.D.   On: 12/20/2021 10:21   DG Abd Portable 1V  Result Date: 12/20/2021 CLINICAL DATA:  Enteric tube placement EXAM: PORTABLE ABDOMEN - 1 VIEW COMPARISON:  None Available. FINDINGS: Tip and side port of enteric tube are in the stomach. There are linear densities in the lower lung fields suggesting scarring or subsegmental atelectasis. Surgical clips are seen in right upper quadrant. Bowel gas pattern in the upper abdomen is unremarkable. Lower abdomen and pelvis are not included in the image. IMPRESSION: Tip of enteric tube is seen in the stomach. Electronically Signed   By: Elmer Picker M.D.   On: 12/20/2021 10:19   DG CHEST PORT 1 VIEW  Result Date: 12/19/2021 CLINICAL DATA:  Respiratory failure, hypoxia EXAM: PORTABLE CHEST 1 VIEW COMPARISON:  Previous studies including the examination of 12/17/2021 FINDINGS: Transverse diameter of heart is increased. Tip of endotracheal tube is 2.3 cm above the carina. NG tube is noted traversing the esophagus. There is poor inspiration. There are linear densities in both lower lung fields with interval worsening. There are no signs of alveolar pulmonary edema. There is blunting of lateral CP angles. There is no pneumothorax. IMPRESSION: There are linear densities in both lower lung fields with interval worsening suggesting atelectasis/pneumonia. Minimal bilateral pleural effusions. Electronically Signed   By: Elmer Picker M.D.   On: 12/19/2021 11:59   Overnight EEG with video  Result Date: 12/19/2021 [name], MD     12/20/2021 10:43 AM Patient Name: Katherine [name] MRN: [id number] Epilepsy Attending: [name] Referring Physician/Provider: Kerney Elbe, MD Duration: 12/18/2021 8144 to 12/18/2021 2017, 12/19/2021 0821 to 12/19/2021 1800 Patient history: 81 year old female with a history of seizures  (on Keppra and Vimpat at home), prior strokes, CKD3, DM, HTN, COPD and frequent UTIs, with recurrent spells of unresponsiveness concerning for seizures. EEG to evaluate for seizure. Level of alertness:  lethargic /sedated AEDs during EEG study: LEV, LCM, Versed Technical aspects: This EEG study was done with scalp electrodes positioned according to the 10-20 International system of electrode placement. Electrical activity was reviewed with band pass filter of 1-70Hz , sensitivity of 7 uV/mm, display speed of 91mm/sec with a 60Hz  notched filter applied as appropriate. EEG data were recorded continuously and digitally stored.  Video monitoring was available and reviewed as appropriate. Description: EEG showed continuous generalized 3 to 6 Hz theta-delta slowing admixed with 12-14hz  generalized beta activity. Hyperventilation and photic stimulation were not performed. Event button was pressed on 12/18/2021 at 1037 for lower lip quivering.  Concomitant EEG before, during and after the event did not show any EEG changes suggest seizure. Study was disconnected between 12/18/2021 2017 to 12/19/2021 8185 due to technical issues. ABNORMALITY - Continuous slow, generalized IMPRESSION: This study is suggestive of severe diffuse encephalopathy, nonspecific etiology but could be related to sedation. No seizures or epileptiform discharges were seen throughout the recording. Priyanka O Yadav   [place] EKG SITE RITE  Result Date: 12/19/2021 If Site Rite image not attached, placement could not be confirmed due to current cardiac rhythm.  [place] EKG SITE RITE  Result Date: 12/19/2021 If Site Rite image not attached, placement could not be confirmed due to current cardiac rhythm.  Rapid EEG  Result Date: 12/18/2021 [name], MD     12/18/2021  9:13 AM Patient Name: Katherine [name] MRN: [id number] Epilepsy Attending: [name] Referring Physician/Provider: Amie Portland, MD Duration: 12/17/2021 2045 to 12/18/2021 4403 Patient  history: 81 y.o. female past history of CKD 3, COPD, diabetes, hypertension, seizures on Keppra and Vimpat at home, prior strokes-2019 also received tPA, frequent UTIs, presenting with unresponsiveness and possible seizure. EEG to evaluate for seizure Level of alertness: lethargic/sedated AEDs during EEG study: LEV, LCM, Versed Technical aspects: This EEG was obtained using a 10 lead EEG system positioned circumferentially without any parasagittal coverage (rapid EEG). Computer selected EEG is reviewed as  well as background features and all clinically significant events. Description: EEG showed continuous generalized 3 to 6 Hz theta-delta slowing admixed with 12-14hz  generalized beta activity.Hyperventilation and photic stimulation were not performed.   ABNORMALITY - Continuous slow, generalized IMPRESSION: This limited ceribell EEG is suggestive of severe diffuse encephalopathy, nonspecific etiology. No seizures or epileptiform discharges were seen throughout the recording. [name]   DG Chest Port 1 View  Result Date: 12/17/2021 CLINICAL DATA:  Endotracheal tube in place. EXAM: PORTABLE CHEST 1 VIEW COMPARISON:  Radiograph earlier today.  Chest CT 12/20/2018 FINDINGS: The endotracheal tube is been retracted, the tip is 3.2 cm from the carina. Enteric tube in place with tip below the diaphragm. The patient is rotated. Stable heart size. There is aortic atherosclerosis and tortuosity. Apparent rounded density inferior to the transverse aorta is likely stable and accentuated by rotation. Scarring at the right lung base. Similar atelectasis in the left mid lower lung zone. No pneumothorax or pleural effusion. IMPRESSION: 1. Endotracheal tube tip 3.2 cm from the carina. Enteric tube in place with tip below the diaphragm. 2. Similar atelectasis in the left mid lower lung zone. Electronically Signed   By: Keith Rake M.D.   On: 12/17/2021 21:22   DG Chest Portable 1 View  Result Date:  12/17/2021 CLINICAL DATA:  Intubated. EXAM: PORTABLE CHEST 1 VIEW COMPARISON:  09/08/2021 FINDINGS: Stable borderline enlarged cardiac silhouette. Tortuous and calcified thoracic aorta. Interval endotracheal tube with its tip at the carina. Stable mild prominence of the interstitial markings with normal vascularity. Small amount of linear atelectasis in the left mid to lower lung zone. The remainder of the lungs are clear. Mild thoracic spine degenerative changes. Nasogastric tube extending into the stomach with its tip in the proximal stomach. IMPRESSION: 1. Interval endotracheal tube with its tip at the carina. This should be retracted 5 cm. 2. Small amount of linear atelectasis in the left mid to lower lung zone. Electronically Signed   By: Claudie Revering M.D.   On: 12/17/2021 17:07   CT Head Wo Contrast  Result Date: 12/17/2021 CLINICAL DATA:  Loss of consciousness.  Delirium. EXAM: CT HEAD WITHOUT CONTRAST TECHNIQUE: Contiguous axial images were obtained from the base of the skull through the vertex without intravenous contrast. RADIATION DOSE REDUCTION: This exam was performed according to the departmental dose-optimization program which includes automated exposure control, adjustment of the mA and/or kV according to patient size and/or use of iterative reconstruction technique. COMPARISON:  09/08/2021 FINDINGS: Brain: No evidence of intracranial hemorrhage, acute infarction, hydrocephalus, extra-axial collection, or mass lesion/mass effect. Mild generalized cerebral atrophy remains stable. Vascular:  No hyperdense vessel or other acute findings. Skull: No evidence of fracture or other significant bone abnormality. Sinuses/Orbits: Increased mucosal thickening involving the ethmoid sinuses. Other: None. IMPRESSION: No acute intracranial abnormality.  Mild cerebral atrophy. Increased mucosal thickening involving the ethmoid sinuses. Electronically Signed   By: Marlaine Hind M.D.   On: 12/17/2021 16:11     Today   Subjective    Katherine [name] today has no headache,no chest abdominal pain,no new weakness tingling or numbness, feels much better.    Objective   Blood pressure 105/60, pulse 87, temperature 98 F (36.7 C), temperature source Oral, resp. rate 15, height 5\' 1"  (1.549 m), weight 103.1 kg, SpO2 92 %.   Intake/Output Summary (Last 24 hours) at 12/26/2021 1000 Last data filed at 12/26/2021 0400 Gross per 24 hour  Intake 1040 ml  Output 800 ml  Net 240 ml    Exam  Awake Alert, No new F.N deficits,    [place].AT,PERRAL Supple Neck,   Symmetrical Chest wall movement, Good air movement bilaterally, CTAB RRR,No Gallops,   +ve B.Sounds, Abd Soft, Non tender,  No Cyanosis, Clubbing or edema    Data Review   Recent Labs  Lab 12/21/21 0316 12/22/21 0401 12/24/21 0339 12/25/21 0413 12/26/21 0335  WBC 8.9 8.6 6.9 6.1 6.8  HGB 10.3* 11.0* 10.6* 10.2* 10.8*  HCT 32.9* 35.9* 32.9* 32.0* 33.7*  PLT 154 177 186 172 167  MCV 93.5 96.0 92.7 92.8 93.6  MCH 29.3 29.4 29.9 29.6 30.0  MCHC 31.3 30.6 32.2 31.9 32.0  RDW 13.9 13.8 13.6 13.7 13.5  LYMPHSABS  --   --  1.9 2.4 2.7  MONOABS  --   --  0.6 0.6 0.6  EOSABS  --   --  0.0 0.2 0.3  BASOSABS  --   --  0.0 0.0 0.0    Recent Labs  Lab 12/19/21 1710 12/20/21 0439 12/21/21 0316 12/22/21 0401 12/23/21 0530 12/24/21 0339 12/25/21 0413 12/26/21 0335  NA  --    < > 144 146* 143 144 145 140  K  --    < > 3.8 5.1 4.5 3.9 3.3* 4.1  CL  --    < > 105 105 105 107 113* 103  CO2  --    < > 27 30 28 29 25 28   GLUCOSE  --    < > 126* 134* 132* 87 76 98  BUN  --    < > 49* 50* 52* 48* 33* 29*  CREATININE  --    < > 1.86* 1.99* 1.87* 1.98* 1.46* 1.66*  CALCIUM  --    < > 9.1 9.3 9.1 9.0 7.5* 8.8*  AST  --   --  15  --   --   --   --   --   ALT  --   --  9  --   --   --   --   --   ALKPHOS  --   --  68  --   --   --   --   --   BILITOT  --   --  0.2*  --   --   --   --   --   ALBUMIN  --   --  2.7*  --   --   --   --   --    MG 2.6*  --   --   --   --  2.5* 2.0 2.2  PHOS 2.5  --   --   --   --   --   --   --   BNP  --   --   --   --   --  130.4* 73.2 57.9   < > = values in this interval not displayed.    Total Time in preparing paper work, data evaluation and todays exam - 61 minutes  Lala Lund M.D on 12/26/2021 at 10:00 AM  [facility]

## 2021-12-27 DIAGNOSIS — M0520 Rheumatoid vasculitis with rheumatoid arthritis of unspecified site: Secondary | ICD-10-CM | POA: Diagnosis not present

## 2021-12-27 DIAGNOSIS — M6281 Muscle weakness (generalized): Secondary | ICD-10-CM | POA: Diagnosis not present

## 2021-12-27 DIAGNOSIS — I10 Essential (primary) hypertension: Secondary | ICD-10-CM | POA: Diagnosis not present

## 2021-12-27 DIAGNOSIS — G4733 Obstructive sleep apnea (adult) (pediatric): Secondary | ICD-10-CM | POA: Diagnosis not present

## 2021-12-27 DIAGNOSIS — G40909 Epilepsy, unspecified, not intractable, without status epilepticus: Secondary | ICD-10-CM | POA: Diagnosis not present

## 2021-12-27 DIAGNOSIS — K219 Gastro-esophageal reflux disease without esophagitis: Secondary | ICD-10-CM | POA: Diagnosis not present

## 2021-12-27 DIAGNOSIS — F329 Major depressive disorder, single episode, unspecified: Secondary | ICD-10-CM | POA: Diagnosis not present

## 2021-12-28 DIAGNOSIS — G4089 Other seizures: Secondary | ICD-10-CM | POA: Diagnosis not present

## 2021-12-28 DIAGNOSIS — R5381 Other malaise: Secondary | ICD-10-CM | POA: Diagnosis not present

## 2021-12-28 DIAGNOSIS — E1122 Type 2 diabetes mellitus with diabetic chronic kidney disease: Secondary | ICD-10-CM | POA: Diagnosis not present

## 2021-12-28 DIAGNOSIS — E7849 Other hyperlipidemia: Secondary | ICD-10-CM | POA: Diagnosis not present

## 2021-12-29 IMAGING — CT CT RENAL STONE PROTOCOL
2 of 4 series · 16 of 46 positions shown, 18 images · non-contrast
Comparison: 11/21/2018

CLINICAL DATA: Flank pain with kidney stone suspected

EXAM:
CT ABDOMEN AND PELVIS WITHOUT CONTRAST
TECHNIQUE: Multidetector CT imaging of the abdomen and pelvis was performed
following the standard protocol without IV contrast.

[Series 2: stone full standard · axial · 0.77mm/px · z∈[-1016,-612]mm · 13 of 89 slices shown, 15 images]
[im 4/89  soft-tissue]
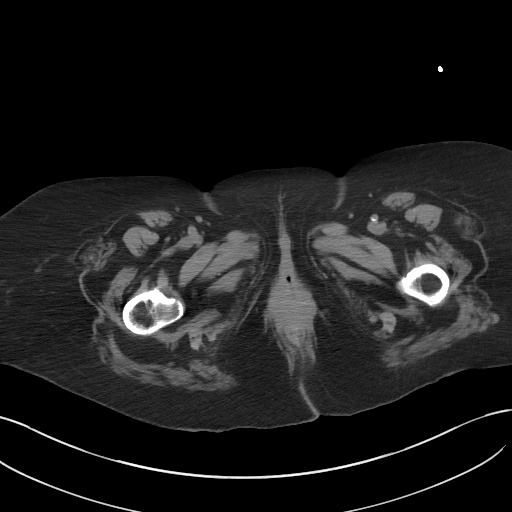
[im 4/89  bone]
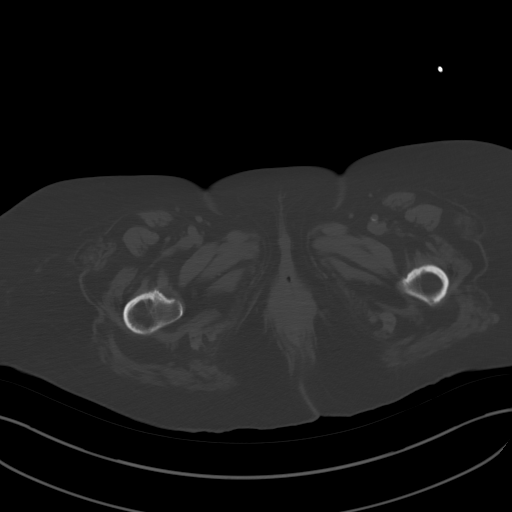
[im 12/89  soft-tissue]
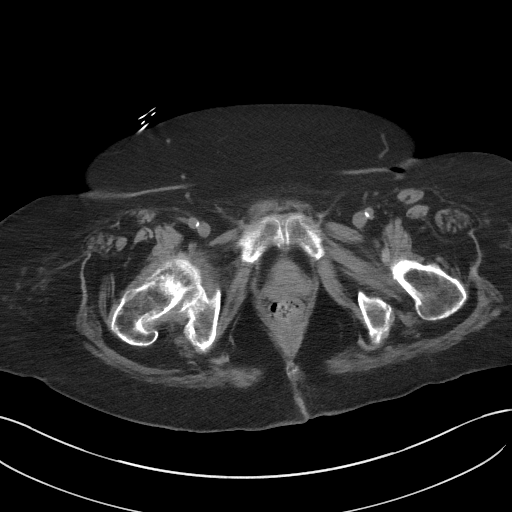
[im 19/89  soft-tissue]
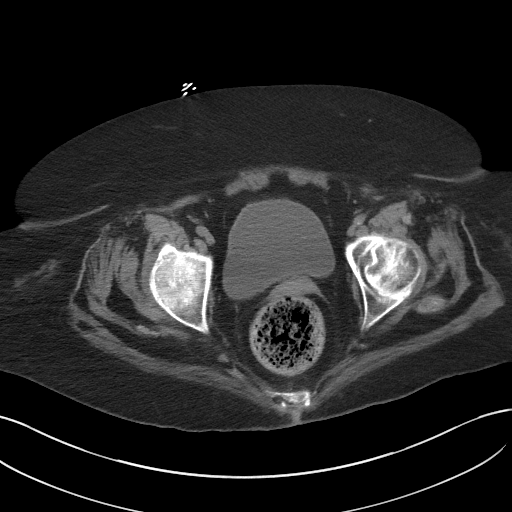
[im 26/89  soft-tissue]
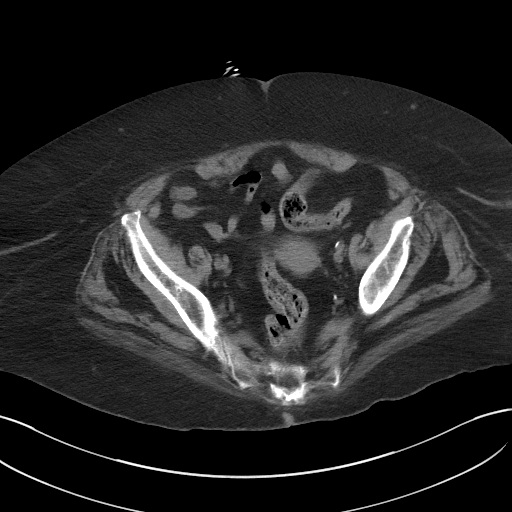
[im 30/89  soft-tissue]
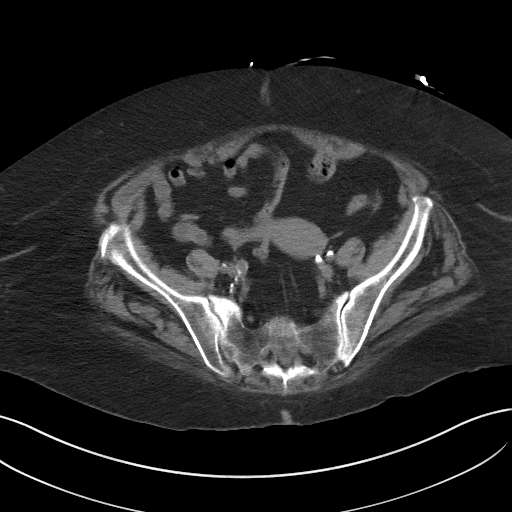
[im 37/89  soft-tissue]
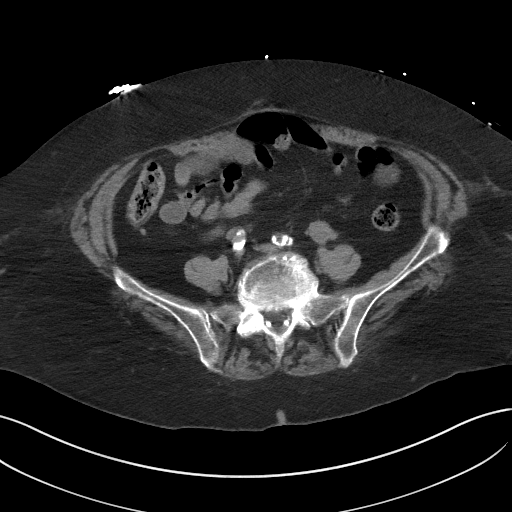
[im 45/89  soft-tissue]
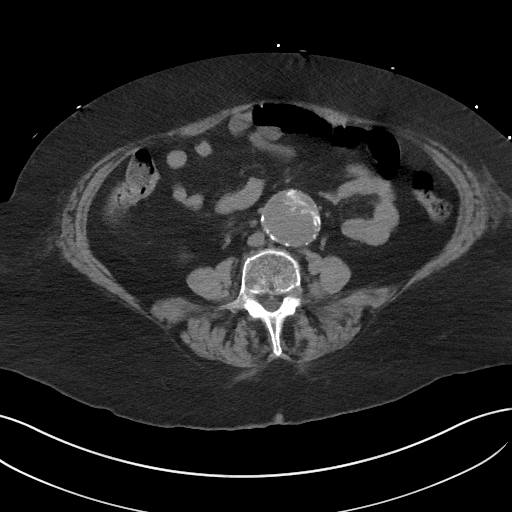
[im 52/89  soft-tissue]
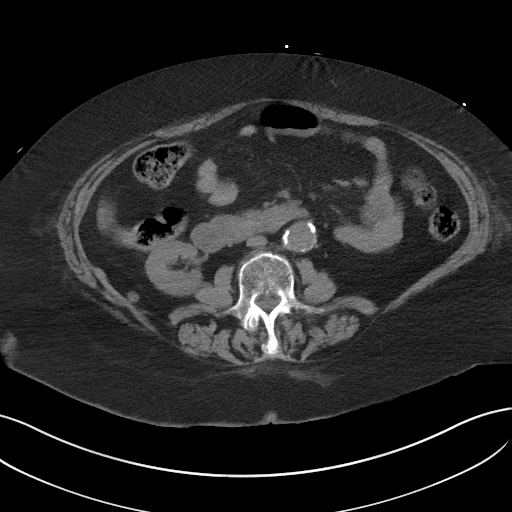
[im 59/89  soft-tissue]
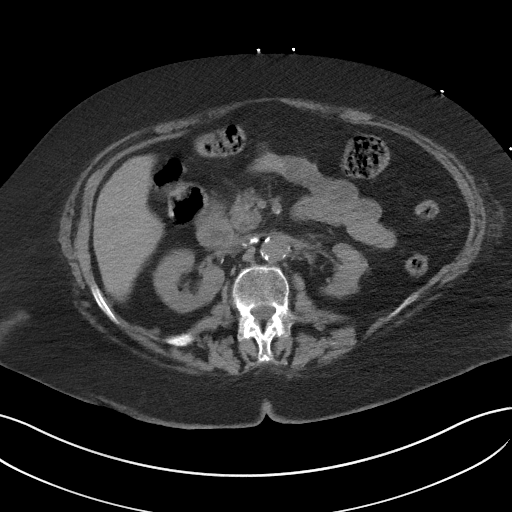
[im 59/89  bone]
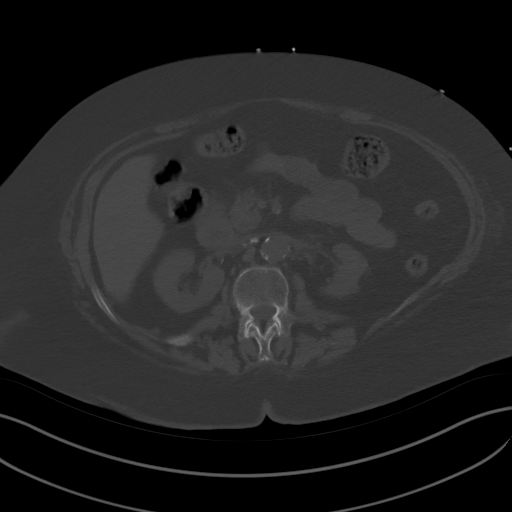
[im 63/89  soft-tissue]
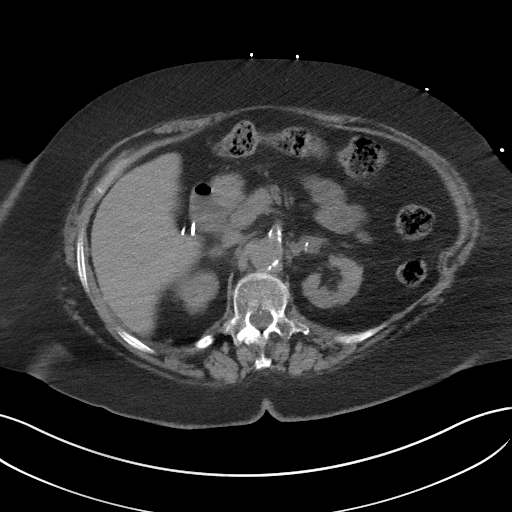
[im 70/89  soft-tissue]
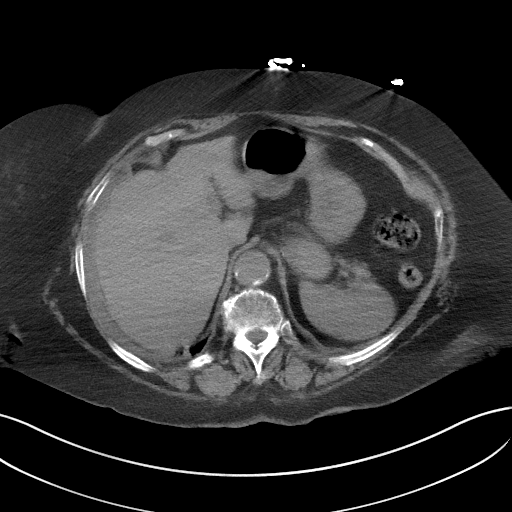
[im 78/89  soft-tissue]
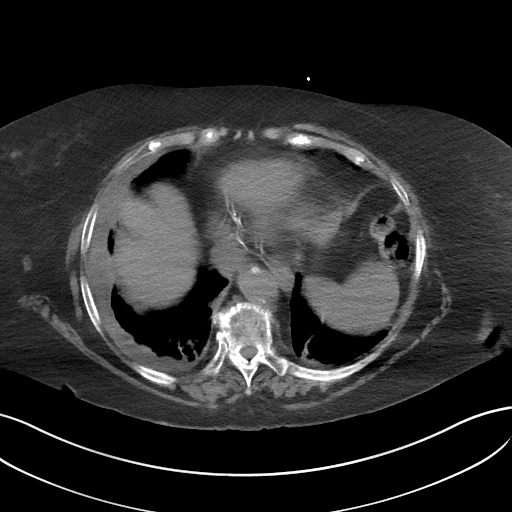
[im 85/89  soft-tissue]
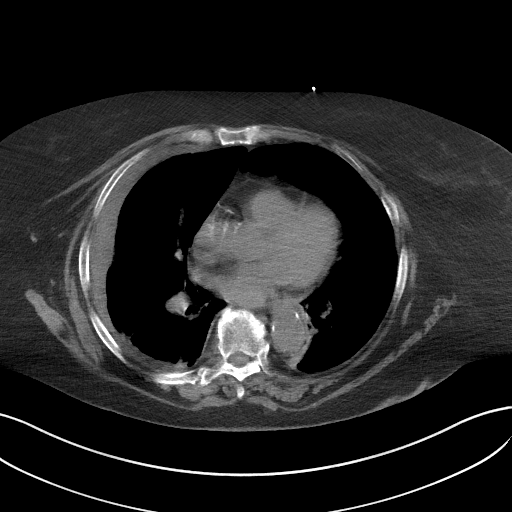

[Series 5: coronal · coronal · 0.75mm/px · 3 of 129 slices shown]
[im 43/129  soft-tissue]
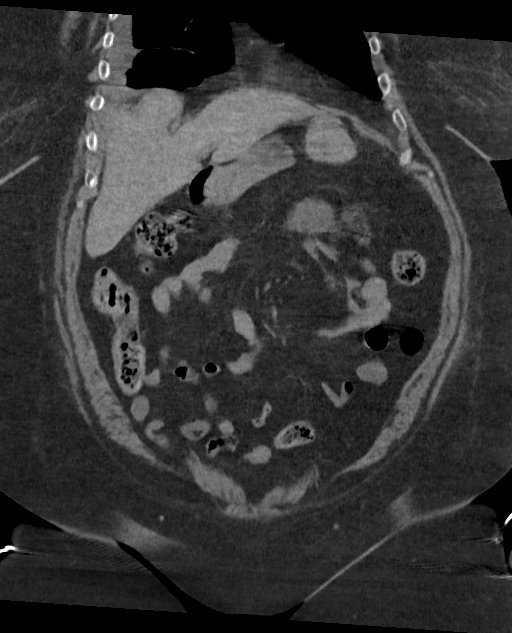
[im 57/129  soft-tissue]
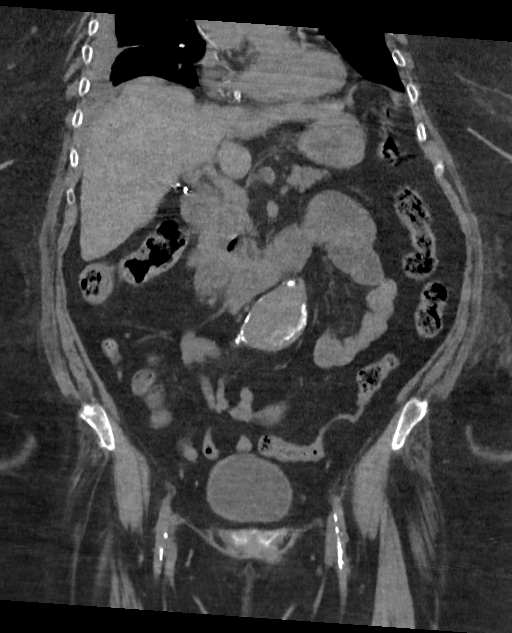
[im 72/129  soft-tissue]
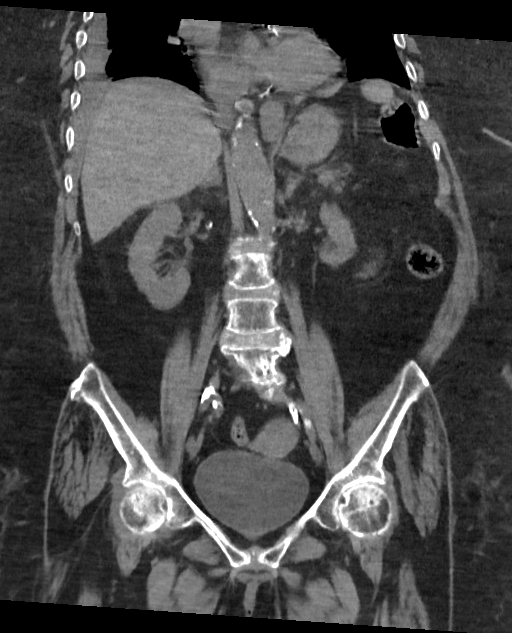

[16 of 46 positions shown; findings below may reference images not displayed]

FINDINGS: Lower chest: Loculated pleural fluid on the right with subpleural
reticulation. The effusion is small volume but extensive where
covered.

Extensive aortic and coronary calcification.

Hepatobiliary: No focal liver abnormality.Cholecystectomy. No bile
duct dilatation.

Pancreas: Unremarkable.

Spleen: Unremarkable.

Adrenals/Urinary Tract: Negative adrenals. No hydronephrosis or
ureteral stone. Asymmetric left renal atrophy. Small cystic density
at the upper pole left kidney that is stable from prior enhanced
scan. Punctate right lower pole calculus. 8 mm nodule in the
posterior pararenal space on the right with emanating vessel,
essentially stable from prior and considered benign. Unremarkable
bladder.

Stomach/Bowel:  No obstruction. No evidence of bowel inflammation.

Vascular/Lymphatic: No acute vascular abnormality. Extensive
atherosclerosis of the aorta and iliacs. Fusiform abdominal aortic
aneurysm measuring up to 4.2 Cm in diameter on orthogonal
measurements by sagittal reformats. When measured in a similar
fashion there is no change. No change in the degree of ventral
intimal calcification distortion at this level. No adenopathy

Reproductive:No pathologic findings.

Other: No ascites or pneumoperitoneum.

Musculoskeletal: No acute abnormalities. Disc degeneration and
spondylosis.
IMPRESSION: 1. No acute finding. There is a small right renal calculus that is
nonobstructive.
2. Complex/loculated right pleural effusion that is small volume.
This effusion has increased from 3434 imaging, without evident
cause.
3.  Aortic Atherosclerosis (ARZIY-GXH.H).  Coronary atherosclerosis.
4. 4.2 cm diameter fusiform intrarenal aortic aneurysm. No growth
since CT in 3434. Recommend followup by ultrasound in 1 year. This
recommendation follows ACR consensus guidelines: White Paper of the
ACR Incidental Findings Committee II on Vascular Findings. [HOSPITAL] 6138; [DATE]. Aortic aneurysm NOS (ARZIY-QKM.F)

## 2021-12-29 IMAGING — DX DG CHEST 1V PORT
1 series · 1 of 1 positions shown · non-contrast
Comparison: Chest radiograph dated 06/08/2019.

CLINICAL DATA: 78-year-old female with sepsis.

EXAM:
PORTABLE CHEST 1 VIEW

[chest ap]
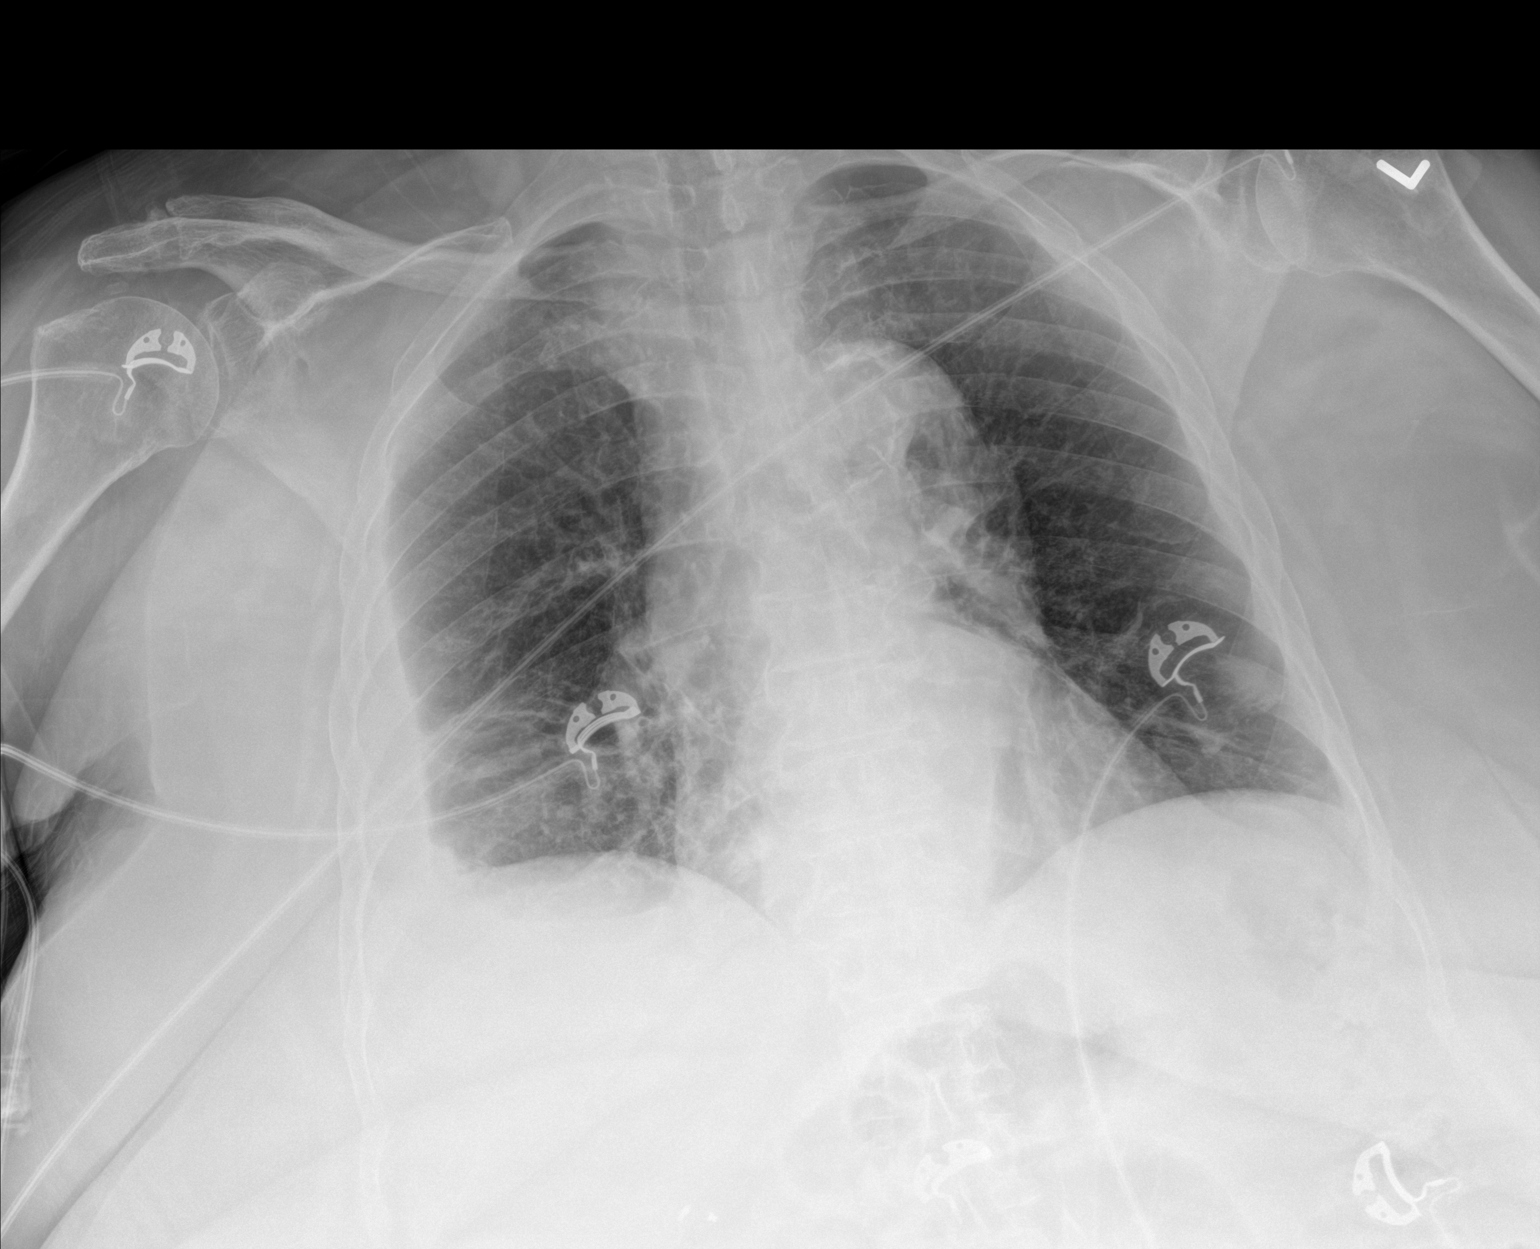

[1 of 1 positions shown; findings below may reference images not displayed]

FINDINGS: Small right pleural effusion similar or slightly increased since the
prior radiograph. Trace left pleural effusion may be present. There
is background of emphysema. Bilateral mid to lower lung field
streaky densities may represent atelectasis/scarring or infiltrate.
Overall similar appearance since the prior radiograph. No new
consolidation. There is no pneumothorax. Stable cardiomediastinal
silhouette. Atherosclerotic calcification of the aorta. No acute
osseous pathology.
IMPRESSION: 1. Small right pleural effusion, similar or slightly increased since
the prior radiograph.
2. Bibasilar atelectasis/scarring.  No new consolidation.

## 2021-12-30 DIAGNOSIS — I7121 Aneurysm of the ascending aorta, without rupture: Secondary | ICD-10-CM | POA: Diagnosis not present

## 2022-01-02 DIAGNOSIS — E7800 Pure hypercholesterolemia, unspecified: Secondary | ICD-10-CM | POA: Diagnosis not present

## 2022-01-02 DIAGNOSIS — J9622 Acute and chronic respiratory failure with hypercapnia: Secondary | ICD-10-CM | POA: Diagnosis not present

## 2022-01-02 DIAGNOSIS — R6889 Other general symptoms and signs: Secondary | ICD-10-CM | POA: Diagnosis not present

## 2022-01-02 DIAGNOSIS — I7121 Aneurysm of the ascending aorta, without rupture: Secondary | ICD-10-CM | POA: Diagnosis not present

## 2022-01-02 DIAGNOSIS — I10 Essential (primary) hypertension: Secondary | ICD-10-CM | POA: Diagnosis not present

## 2022-01-23 ENCOUNTER — Encounter: Payer: Self-pay | Admitting: Nurse Practitioner

## 2022-01-23 ENCOUNTER — Non-Acute Institutional Stay: Payer: Medicare HMO | Admitting: Nurse Practitioner

## 2022-01-23 VITALS — BP 111/78 | HR 78 | Temp 97.5°F | Resp 18 | Wt 233.0 lb

## 2022-01-23 DIAGNOSIS — R0602 Shortness of breath: Secondary | ICD-10-CM | POA: Diagnosis not present

## 2022-01-23 DIAGNOSIS — I509 Heart failure, unspecified: Secondary | ICD-10-CM

## 2022-01-23 DIAGNOSIS — Z515 Encounter for palliative care: Secondary | ICD-10-CM

## 2022-01-23 DIAGNOSIS — I739 Peripheral vascular disease, unspecified: Secondary | ICD-10-CM | POA: Diagnosis not present

## 2022-01-23 DIAGNOSIS — R5381 Other malaise: Secondary | ICD-10-CM | POA: Diagnosis not present

## 2022-01-23 NOTE — Progress Notes (Signed)
[facility] Palliative Care Consult Note Telephone: [phone]  Fax: [phone]    Date of encounter: 01/23/22 9:10 PM PATIENT NAME: Katherine [name] [address] [place] [address]   [phone] (home)  DOB: May 10, 1940 MRN: [id number] PRIMARY CARE PROVIDER:    Care, [place] [address] [phone]  RESPONSIBLE PARTY:    Contact Information     Name Relation Home Work Katherine Spouse [phone]  [phone]   Katherine, [name] Daughter [phone]  [phone]     I met face to face with patient in facility. Palliative Care was asked to follow this patient by consultation request of  Care, [place] to address advance care planning and complex medical decision making. This is a follow up visit.                                  ASSESSMENT AND PLAN / RECOMMENDATIONS:  Symptom Management/Plan: 1. ACP: remains a full code; most form in ACP   2. Edema stable; secondary to CHF, monitor weights, elevate; discussed nutrition with Katherine [name]   3. Shortness of breath secondary to CHF, intermit symptoms;  continue diuresing, inhalation therapy; Continuous O2,  Continue daily weights;    10/23/2020 weight 249.1 lbs 12/24/2020 weight 245.1 lbs 01/16/2021 weight 239.0 lbs 04/30/2021 weight 241.2 lbs 05/18/2021 weight 240 lbs 10/21/2021 weight 229 lbs 11/16/2021 weight 233.5 lbs 01/13/2022 weight 233 lbs 4. Debility; discussed with Katherine [name] about getting oob which she declines. Revisited with Katherine [name] updated Katherine [name] getting out of bed, explained non-compliance, if she declines to staff when they try to get her oob.    5. Palliative care encounter; Palliative medicine team will continue to support patient, patient's family, and medical team. Visit consisted of counseling and education dealing with the complex and emotionally intense issues of symptom management and palliative care in the  setting of serious and potentially life-threatening illness   Follow up Palliative Care Visit: Palliative care will continue to follow for complex medical decision making, advance care planning, and clarification of goals. Return 4 to 8 weeks or prn.   I spent 45 minutes providing this consultation starting at 1:45 pm. More than 50% of the time in this consultation was spent in counseling and care coordination. PPS: 30%   Chief Complaint: Follow up palliative consult for complex medical decision making   HISTORY OF PRESENT ILLNESS:  Katherine [name] is a 81 y.o. 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 Katherine [name] resides at [facility]. Katherine [name] remains bed bound, require staff to assist her with turning, positioning, bathing, dressing. Katherine [name] does feed herself after tray setup. Katherine [name] has been feeding herself per staff. Staff endorses no other changes or problems. I visited and observed Katherine [name] she was lying in bed, appears comfortable. No visitors present. Katherine [name] and I talked about ros, functional debility. We talked about appetite, foods she likes. We talked about overall concerns. Discussed  pain, sob. We talked about debility. We talked about chronic disease progression. We talked about residing at facility with barriers. Discussed Katherine [name] family. We talked about concerns. We talked  about importance of mobility. Most pc visit supportive. Medical goals, medications, goc reviewed. Katherine [name] was cooperative with assessment.   Medical goals, medications reviewed. No new changes to poc. Updated staff, attempted to contact husband, unable to reach.    Therapeutic listening, emotional support provided. Updated. Staff.    .  History obtained from review of EMR, discussion with primary team, and interview with family, facility staff/caregiver and/or Katherine [name].  I reviewed available labs, medications, imaging, studies and related documents from the EMR.  Records reviewed and summarized above.    ROS 10 point system reviewed with staff as Katherine [name] has cognitive impairment with Katherine [name] being sleepy today   Physical Exam: Constitutional: NAD General: obese, debilitated, chronically ill female EYES:  lids intact ENMT: oral mucous membranes moist CV: S1S2, RRR Pulmonary: decrease bases, no increased work of breathing, no cough MSK: bed-bound; non-ambulatory Skin: warm and dry Neuro:  + generalized weakness,  + cognitive impairment Psych: flat affect, A and Oriented to self  Thank you for the opportunity to participate in the care of Katherine [name]. Please call our office at [phone] if we can be of additional assistance.   Babbie [name] Ihor Gully, NP

## 2022-01-31 DIAGNOSIS — J449 Chronic obstructive pulmonary disease, unspecified: Secondary | ICD-10-CM | POA: Diagnosis not present

## 2022-01-31 DIAGNOSIS — M6281 Muscle weakness (generalized): Secondary | ICD-10-CM | POA: Diagnosis not present

## 2022-01-31 DIAGNOSIS — I5032 Chronic diastolic (congestive) heart failure: Secondary | ICD-10-CM | POA: Diagnosis not present

## 2022-02-03 ENCOUNTER — Ambulatory Visit (INDEPENDENT_AMBULATORY_CARE_PROVIDER_SITE_OTHER): Payer: Medicare HMO | Admitting: Vascular Surgery

## 2022-02-03 ENCOUNTER — Other Ambulatory Visit (INDEPENDENT_AMBULATORY_CARE_PROVIDER_SITE_OTHER): Payer: Medicare HMO

## 2022-02-06 DIAGNOSIS — R0602 Shortness of breath: Secondary | ICD-10-CM | POA: Diagnosis not present

## 2022-02-14 DIAGNOSIS — G4733 Obstructive sleep apnea (adult) (pediatric): Secondary | ICD-10-CM | POA: Diagnosis not present

## 2022-02-14 DIAGNOSIS — G40909 Epilepsy, unspecified, not intractable, without status epilepticus: Secondary | ICD-10-CM | POA: Diagnosis not present

## 2022-02-15 DIAGNOSIS — Z91199 Patient's noncompliance with other medical treatment and regimen due to unspecified reason: Secondary | ICD-10-CM | POA: Diagnosis not present

## 2022-02-15 DIAGNOSIS — I5032 Chronic diastolic (congestive) heart failure: Secondary | ICD-10-CM | POA: Diagnosis not present

## 2022-02-15 DIAGNOSIS — G4733 Obstructive sleep apnea (adult) (pediatric): Secondary | ICD-10-CM | POA: Diagnosis not present

## 2022-02-15 DIAGNOSIS — J449 Chronic obstructive pulmonary disease, unspecified: Secondary | ICD-10-CM | POA: Diagnosis not present

## 2022-02-16 DIAGNOSIS — I10 Essential (primary) hypertension: Secondary | ICD-10-CM | POA: Diagnosis not present

## 2022-02-16 DIAGNOSIS — J9622 Acute and chronic respiratory failure with hypercapnia: Secondary | ICD-10-CM | POA: Diagnosis not present

## 2022-02-16 DIAGNOSIS — E7800 Pure hypercholesterolemia, unspecified: Secondary | ICD-10-CM | POA: Diagnosis not present

## 2022-02-16 DIAGNOSIS — I7121 Aneurysm of the ascending aorta, without rupture: Secondary | ICD-10-CM | POA: Diagnosis not present

## 2022-02-17 DIAGNOSIS — N183 Chronic kidney disease, stage 3 unspecified: Secondary | ICD-10-CM | POA: Diagnosis not present

## 2022-02-23 ENCOUNTER — Other Ambulatory Visit: Payer: Self-pay

## 2022-02-23 ENCOUNTER — Ambulatory Visit: Payer: Medicare HMO | Admitting: Gastroenterology

## 2022-02-23 NOTE — Progress Notes (Deleted)
Jonathon Bellows MD, MRCP(U.K) [address]  [place]  [place], [place] [address]  Main: [phone]  Fax: [phone]   Gastroenterology Consultation  Referring Provider:     Care, [place] Primary Care Physician:  Care, [place] Primary Gastroenterologist:  Dr. Jonathon Bellows  Reason for Consultation:     Difficulty following        HPI:   Katherine [name] is a 82 y.o. y/o female referred for difficulty swallowing.   12/26/2021 hemoglobin 10.8 g with an MCV of 93.6 Past Medical History:  Diagnosis Date   Acute encephalopathy 02/11/2019   Acute on chronic anemia 01/03/2019   Acute on chronic congestive heart failure (HCC)    Acute respiratory failure with hypoxia and hypercapnia (HCC) 04/19/2021   Angioedema    Anxiety and depression    Aphasia 06/28/2017   Cerebral atrophy ([place]) 06/19/2021   Chest pain 07/15/2018   CHF (congestive heart failure) (HCC)    Chronic constipation    Chronic kidney disease, stage 4 (severe) ([place]) 06/19/2021   COPD (chronic obstructive pulmonary disease) ([place])    COPD exacerbation ([place]) 11/30/2017   COPD with acute exacerbation ([place]) 02/24/2018   COVID-19 12/21/2018   COVID-19 virus infection 12/21/2018   Delirium    Diabetes mellitus type 2, uncomplicated ([place]) 0000000   Diabetes mellitus without complication (HCC)    Elevated rheumatoid factor 09/05/2017   Gallstones    GERD (gastroesophageal reflux disease)    Hematemesis 04/28/2018   Hemoptysis 12/20/2018   Hypertension    Influenza A 04/13/2018   Left knee pain 06/12/2016   Left ventricular hypertrophy    Localized edema 05/10/2018   Osteoarthritis    Pain in finger of left hand 09/12/2016   Pain in soft tissues of limb 05/20/2013   Formatting of this note might be different from the original. Last Assessment & Plan:  Handled by [facility] doctor; wearing boot   Palliative care encounter 05/10/2018   Possible Seizures ([place]) 10/08/2017   Pressure injury of skin  10/29/2017   Prurigo nodularis    Rheumatoid arthritis ([place])    on plaquenil   Right hand pain 03/21/2017   Seizures ([place])    Sepsis ([place]) 10/24/2017   Sepsis due to gram-negative UTI ([place]) 06/19/2019   Stroke ([place])    Stroke ([place]) 06/19/2019   Stroke-like episode ([place]) s/p IV tpa 10/04/2017   Tobacco abuse    Urinary tract infection 11/22/2017   UTI (urinary tract infection) 08/21/2018   Vitamin D deficiency disease     Past Surgical History:  Procedure Laterality Date   CHOLECYSTECTOMY     POLYPECTOMY  11/2011   vocal cord   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   Procedure: TOTAL KNEE ARTHROPLASTY;  Surgeon: Hessie Knows, MD;  Location: [facility] ORS;  Service: Orthopedics;  Laterality: Right;    Prior to Admission medications   Medication Sig Start Date End Date Taking? Authorizing Provider  Acetaminophen 325 MG CAPS Take 650 mg by mouth every 8 (eight) hours as needed (pain).    [provider]  Fluticasone-Umeclidin-Vilant 100-62.5-25 MCG/INH AEPB Inhale 1 puff into the lungs in the morning.    [provider]  gabapentin (NEURONTIN) 100 MG capsule Take 2 capsules (200 mg total) by mouth at bedtime. 06/21/21   Ezequiel Essex, MD  guaiFENesin (MUCINEX) 600 MG 12 hr tablet Take 600 mg by mouth every 12 (twelve) hours as needed for to loosen phlegm (or for secretions).    [provider]  hydroxychloroquine (PLAQUENIL) 200 MG tablet Take 200 mg by mouth See admin instructions. Take 200 mg by mouth at 5 PM daily 12/07/21   [provider]  hydrOXYzine (ATARAX) 25 MG tablet Take 25 mg by mouth 3 (three) times daily. 11/25/21   [provider]  ipratropium-albuterol (DUONEB) 0.5-2.5 (3) MG/3ML SOLN Take 3 mLs by nebulization every 6 (six) hours as needed. Patient taking differently: Take 3 mLs by nebulization See admin instructions. Nebulize 3 ml's and inhale into the lungs every 6 hours and and an additional 3 ml's every 6 hours as needed  for shortness of breath 06/21/21   Ezequiel Essex, MD  lacosamide 100 MG TABS Take 1 tablet (100 mg total) by mouth 2 (two) times daily. 12/31/18   Thurnell Lose, MD  levETIRAcetam (KEPPRA) 500 MG tablet Take 1 tablet (500 mg total) by mouth 2 (two) times daily. Patient taking differently: Take 500 mg by mouth every 12 (twelve) hours. 10/26/21 10/21/22  Alric Ran, MD  lidocaine (LIDODERM) 5 % Place 1 patch onto the skin See admin instructions. Apply 1 patch to left knee topically at bedtime and remove in the morning 01/06/21   [provider]  loratadine (CLARITIN) 10 MG tablet Take 10 mg by mouth daily at 6 PM.    [provider]  OXYGEN Inhale 3 L/min into the lungs continuous.    [provider]  pantoprazole (PROTONIX) 20 MG tablet Take 20 mg by mouth daily before breakfast. 04/15/21   [provider]  PRESCRIPTION MEDICATION See admin instructions. BiPAP- At bedtime    [provider]  propranolol (INDERAL) 10 MG tablet Take 10 mg by mouth 2 (two) times daily. 04/12/21   [provider]  sertraline (ZOLOFT) 50 MG tablet Take 1 tablet (50 mg total) by mouth daily. Patient taking differently: Take 50 mg by mouth in the morning. 06/22/21   Ezequiel Essex, MD  Skin Protectants, Misc. (EUCERIN) cream Apply 1 application  topically See admin instructions. Apply to bilateral feet at bedtime    [provider]  sodium chloride (OCEAN) 0.65 % SOLN nasal spray Place 3 sprays into both nostrils See admin instructions. Administer 3 sprays alternating nostrils three times a day for nasal dryness.    [provider]    Family History  Problem Relation Age of Onset   Alcohol abuse Mother    Sickle cell anemia Daughter    Hypertension Son    Cancer Neg Hx    COPD Neg Hx    Diabetes Neg Hx    Heart disease Neg Hx    Stroke Neg Hx      Social History   Tobacco Use   Smoking status: Former    Packs/day: 0.50    Years: 60.00     Total pack years: 30.00    Types: Cigarettes   Smokeless tobacco: Never   Tobacco comments:    quite 78moago-03/26/18  Vaping Use   Vaping Use: Never used  Substance Use Topics   Alcohol use: No    Alcohol/week: 0.0 standard drinks of alcohol    Comment: rare   Drug use: No    Allergies as of 02/23/2022 - Review Complete 01/23/2022  Allergen Reaction Noted   Bee venom Swelling and Other (See Comments) 01/31/2016   Enalapril maleate Swelling and Other (See Comments) 05/20/2013    Review of Systems:    All systems reviewed and negative except where noted in HPI.   Physical  Exam:  There were no vitals taken for this visit. No LMP recorded. Patient is postmenopausal. Psych:  Alert and cooperative. Normal mood and affect. General:   Alert,  Well-developed, well-nourished, pleasant and cooperative in NAD Head:  Normocephalic and atraumatic. Eyes:  Sclera clear, no icterus.   Conjunctiva pink. Ears:  Normal auditory acuity. Neck:  Supple; no masses or thyromegaly. Lungs:  Respirations even and unlabored.  Clear throughout to auscultation.   No wheezes, crackles, or rhonchi. No acute distress. Heart:  Regular rate and rhythm; no murmurs, clicks, rubs, or gallops. Abdomen:  Normal bowel sounds.  No bruits.  Soft, non-tender and non-distended without masses, hepatosplenomegaly or hernias noted.  No guarding or rebound tenderness.    Neurologic:  Alert and oriented x3;  grossly normal neurologically. Psych:  Alert and cooperative. Normal mood and affect.  Imaging Studies: No results found.  Assessment and Plan:   Katherine [name] is a 82 y.o. y/o female has been referred for difficulty swallowing.  Plan 1.  EGD   Follow up in ***  Dr Jonathon Bellows MD,MRCP(U.K)

## 2022-03-01 DIAGNOSIS — J449 Chronic obstructive pulmonary disease, unspecified: Secondary | ICD-10-CM | POA: Diagnosis not present

## 2022-03-01 DIAGNOSIS — I5032 Chronic diastolic (congestive) heart failure: Secondary | ICD-10-CM | POA: Diagnosis not present

## 2022-03-01 DIAGNOSIS — J9622 Acute and chronic respiratory failure with hypercapnia: Secondary | ICD-10-CM | POA: Diagnosis not present

## 2022-03-03 ENCOUNTER — Inpatient Hospital Stay
Admission: EM | Admit: 2022-03-03 | Discharge: 2022-03-07 | DRG: 193 | Disposition: A | Discharge status: Skilled Nursing Facility | Payer: Medicare HMO | Attending: Internal Medicine | Admitting: Internal Medicine

## 2022-03-03 ENCOUNTER — Emergency Department: Payer: Medicare HMO

## 2022-03-03 DIAGNOSIS — R41841 Cognitive communication deficit: Secondary | ICD-10-CM | POA: Diagnosis not present

## 2022-03-03 DIAGNOSIS — I959 Hypotension, unspecified: Secondary | ICD-10-CM | POA: Diagnosis not present

## 2022-03-03 DIAGNOSIS — R4789 Other speech disturbances: Secondary | ICD-10-CM | POA: Diagnosis not present

## 2022-03-03 DIAGNOSIS — N179 Acute kidney failure, unspecified: Secondary | ICD-10-CM | POA: Diagnosis not present

## 2022-03-03 DIAGNOSIS — Z66 Do not resuscitate: Secondary | ICD-10-CM | POA: Diagnosis present

## 2022-03-03 DIAGNOSIS — D631 Anemia in chronic kidney disease: Secondary | ICD-10-CM | POA: Diagnosis present

## 2022-03-03 DIAGNOSIS — I5032 Chronic diastolic (congestive) heart failure: Secondary | ICD-10-CM | POA: Diagnosis not present

## 2022-03-03 DIAGNOSIS — Z6841 Body Mass Index (BMI) 40.0 and over, adult: Secondary | ICD-10-CM | POA: Diagnosis not present

## 2022-03-03 DIAGNOSIS — N184 Chronic kidney disease, stage 4 (severe): Secondary | ICD-10-CM | POA: Diagnosis present

## 2022-03-03 DIAGNOSIS — I129 Hypertensive chronic kidney disease with stage 1 through stage 4 chronic kidney disease, or unspecified chronic kidney disease: Secondary | ICD-10-CM | POA: Diagnosis not present

## 2022-03-03 DIAGNOSIS — Z7951 Long term (current) use of inhaled steroids: Secondary | ICD-10-CM

## 2022-03-03 DIAGNOSIS — Z96651 Presence of right artificial knee joint: Secondary | ICD-10-CM | POA: Diagnosis present

## 2022-03-03 DIAGNOSIS — G4733 Obstructive sleep apnea (adult) (pediatric): Secondary | ICD-10-CM | POA: Diagnosis present

## 2022-03-03 DIAGNOSIS — M069 Rheumatoid arthritis, unspecified: Secondary | ICD-10-CM | POA: Diagnosis present

## 2022-03-03 DIAGNOSIS — E1122 Type 2 diabetes mellitus with diabetic chronic kidney disease: Secondary | ICD-10-CM | POA: Diagnosis present

## 2022-03-03 DIAGNOSIS — J441 Chronic obstructive pulmonary disease with (acute) exacerbation: Secondary | ICD-10-CM | POA: Diagnosis not present

## 2022-03-03 DIAGNOSIS — F419 Anxiety disorder, unspecified: Secondary | ICD-10-CM | POA: Diagnosis present

## 2022-03-03 DIAGNOSIS — Z79899 Other long term (current) drug therapy: Secondary | ICD-10-CM

## 2022-03-03 DIAGNOSIS — Z8616 Personal history of COVID-19: Secondary | ICD-10-CM | POA: Diagnosis not present

## 2022-03-03 DIAGNOSIS — R4189 Other symptoms and signs involving cognitive functions and awareness: Secondary | ICD-10-CM | POA: Diagnosis not present

## 2022-03-03 DIAGNOSIS — E118 Type 2 diabetes mellitus with unspecified complications: Secondary | ICD-10-CM | POA: Diagnosis present

## 2022-03-03 DIAGNOSIS — J9601 Acute respiratory failure with hypoxia: Secondary | ICD-10-CM | POA: Diagnosis not present

## 2022-03-03 DIAGNOSIS — F0393 Unspecified dementia, unspecified severity, with mood disturbance: Secondary | ICD-10-CM | POA: Diagnosis present

## 2022-03-03 DIAGNOSIS — I130 Hypertensive heart and chronic kidney disease with heart failure and stage 1 through stage 4 chronic kidney disease, or unspecified chronic kidney disease: Secondary | ICD-10-CM | POA: Diagnosis not present

## 2022-03-03 DIAGNOSIS — R6889 Other general symptoms and signs: Secondary | ICD-10-CM | POA: Diagnosis not present

## 2022-03-03 DIAGNOSIS — L281 Prurigo nodularis: Secondary | ICD-10-CM | POA: Diagnosis present

## 2022-03-03 DIAGNOSIS — G319 Degenerative disease of nervous system, unspecified: Secondary | ICD-10-CM | POA: Diagnosis not present

## 2022-03-03 DIAGNOSIS — Z8719 Personal history of other diseases of the digestive system: Secondary | ICD-10-CM

## 2022-03-03 DIAGNOSIS — N1832 Chronic kidney disease, stage 3b: Secondary | ICD-10-CM

## 2022-03-03 DIAGNOSIS — G40909 Epilepsy, unspecified, not intractable, without status epilepticus: Secondary | ICD-10-CM | POA: Diagnosis present

## 2022-03-03 DIAGNOSIS — Z87891 Personal history of nicotine dependence: Secondary | ICD-10-CM

## 2022-03-03 DIAGNOSIS — R0602 Shortness of breath: Secondary | ICD-10-CM | POA: Diagnosis not present

## 2022-03-03 DIAGNOSIS — Z9981 Dependence on supplemental oxygen: Secondary | ICD-10-CM

## 2022-03-03 DIAGNOSIS — Z91030 Bee allergy status: Secondary | ICD-10-CM

## 2022-03-03 DIAGNOSIS — J189 Pneumonia, unspecified organism: Principal | ICD-10-CM | POA: Diagnosis present

## 2022-03-03 DIAGNOSIS — F0394 Unspecified dementia, unspecified severity, with anxiety: Secondary | ICD-10-CM | POA: Diagnosis present

## 2022-03-03 DIAGNOSIS — Z7401 Bed confinement status: Secondary | ICD-10-CM | POA: Diagnosis not present

## 2022-03-03 DIAGNOSIS — E662 Morbid (severe) obesity with alveolar hypoventilation: Secondary | ICD-10-CM | POA: Diagnosis not present

## 2022-03-03 DIAGNOSIS — I2510 Atherosclerotic heart disease of native coronary artery without angina pectoris: Secondary | ICD-10-CM | POA: Diagnosis present

## 2022-03-03 DIAGNOSIS — Z1152 Encounter for screening for COVID-19: Secondary | ICD-10-CM | POA: Diagnosis not present

## 2022-03-03 DIAGNOSIS — J9622 Acute and chronic respiratory failure with hypercapnia: Secondary | ICD-10-CM | POA: Diagnosis not present

## 2022-03-03 DIAGNOSIS — J440 Chronic obstructive pulmonary disease with acute lower respiratory infection: Secondary | ICD-10-CM | POA: Diagnosis present

## 2022-03-03 DIAGNOSIS — J9621 Acute and chronic respiratory failure with hypoxia: Secondary | ICD-10-CM | POA: Diagnosis present

## 2022-03-03 DIAGNOSIS — Z741 Need for assistance with personal care: Secondary | ICD-10-CM | POA: Diagnosis not present

## 2022-03-03 DIAGNOSIS — K219 Gastro-esophageal reflux disease without esophagitis: Secondary | ICD-10-CM | POA: Diagnosis present

## 2022-03-03 DIAGNOSIS — Z9049 Acquired absence of other specified parts of digestive tract: Secondary | ICD-10-CM

## 2022-03-03 DIAGNOSIS — Z8619 Personal history of other infectious and parasitic diseases: Secondary | ICD-10-CM

## 2022-03-03 DIAGNOSIS — I10 Essential (primary) hypertension: Secondary | ICD-10-CM | POA: Diagnosis not present

## 2022-03-03 DIAGNOSIS — I499 Cardiac arrhythmia, unspecified: Secondary | ICD-10-CM | POA: Diagnosis not present

## 2022-03-03 DIAGNOSIS — Z888 Allergy status to other drugs, medicaments and biological substances status: Secondary | ICD-10-CM

## 2022-03-03 DIAGNOSIS — J449 Chronic obstructive pulmonary disease, unspecified: Secondary | ICD-10-CM | POA: Diagnosis present

## 2022-03-03 DIAGNOSIS — Z87440 Personal history of urinary (tract) infections: Secondary | ICD-10-CM

## 2022-03-03 DIAGNOSIS — K5909 Other constipation: Secondary | ICD-10-CM | POA: Diagnosis present

## 2022-03-03 DIAGNOSIS — M1990 Unspecified osteoarthritis, unspecified site: Secondary | ICD-10-CM | POA: Diagnosis present

## 2022-03-03 LAB — BASIC METABOLIC PANEL
Anion gap: 9 (ref 5–15)
BUN: 49 mg/dL — ABNORMAL HIGH (ref 8–23)
CO2: 35 mmol/L — ABNORMAL HIGH (ref 22–32)
Calcium: 9.2 mg/dL (ref 8.9–10.3)
Chloride: 95 mmol/L — ABNORMAL LOW (ref 98–111)
Creatinine, Ser: 2.75 mg/dL — ABNORMAL HIGH (ref 0.44–1.00)
GFR, Estimated: 17 mL/min — ABNORMAL LOW (ref >60)
Glucose, Bld: 116 mg/dL — ABNORMAL HIGH (ref 70–99)
Potassium: 4.7 mmol/L (ref 3.5–5.1)
Sodium: 139 mmol/L (ref 135–145)

## 2022-03-03 LAB — RESP PANEL BY RT-PCR (RSV, FLU A&B, COVID)  RVPGX2
Influenza A by PCR: NEGATIVE
Influenza B by PCR: NEGATIVE
Resp Syncytial Virus by PCR: NEGATIVE
SARS Coronavirus 2 by RT PCR: NEGATIVE

## 2022-03-03 LAB — CBC WITH DIFFERENTIAL/PLATELET
Abs Immature Granulocytes: 0.06 10*3/uL (ref 0.00–0.07)
Basophils Absolute: 0 10*3/uL (ref 0.0–0.1)
Basophils Relative: 1 %
Eosinophils Absolute: 0.1 10*3/uL (ref 0.0–0.5)
Eosinophils Relative: 1 %
HCT: 34.6 % — ABNORMAL LOW (ref 36.0–46.0)
Hemoglobin: 10.2 g/dL — ABNORMAL LOW (ref 12.0–15.0)
Immature Granulocytes: 1 %
Lymphocytes Relative: 36 %
Lymphs Abs: 2.2 10*3/uL (ref 0.7–4.0)
MCH: 27.6 pg (ref 26.0–34.0)
MCHC: 29.5 g/dL — ABNORMAL LOW (ref 30.0–36.0)
MCV: 93.5 fL (ref 80.0–100.0)
Monocytes Absolute: 0.5 10*3/uL (ref 0.1–1.0)
Monocytes Relative: 8 %
Neutro Abs: 3.3 10*3/uL (ref 1.7–7.7)
Neutrophils Relative %: 53 %
Platelets: 247 10*3/uL (ref 150–400)
RBC: 3.7 MIL/uL — ABNORMAL LOW (ref 3.87–5.11)
RDW: 12.9 % (ref 11.5–15.5)
WBC: 6.1 10*3/uL (ref 4.0–10.5)
nRBC: 0.3 % — ABNORMAL HIGH (ref 0.0–0.2)

## 2022-03-03 LAB — TROPONIN I (HIGH SENSITIVITY)
Troponin I (High Sensitivity): 23 ng/L — ABNORMAL HIGH (ref <18)
Troponin I (High Sensitivity): 19 ng/L — ABNORMAL HIGH (ref <18)

## 2022-03-03 LAB — BRAIN NATRIURETIC PEPTIDE: B Natriuretic Peptide: 89.6 pg/mL (ref 0.0–100.0)

## 2022-03-03 MED ORDER — SODIUM CHLORIDE 0.9 % IV BOLUS (SEPSIS)
500.0000 mL | Freq: Once | INTRAVENOUS | Status: AC
  Administered 2022-03-03: 500 mL via INTRAVENOUS

## 2022-03-03 MED ORDER — IPRATROPIUM-ALBUTEROL 0.5-2.5 (3) MG/3ML IN SOLN: 3.0000 mL | Freq: Four times a day (QID) | RESPIRATORY_TRACT | Status: DC | PRN

## 2022-03-03 MED ORDER — GUAIFENESIN ER 600 MG PO TB12
600.0000 mg | ORAL_TABLET | Freq: Two times a day (BID) | ORAL | Status: DC
  Administered 2022-03-04 – 2022-03-07 (×6): 600 mg via ORAL
  Filled 2022-03-03 (×7): qty 1

## 2022-03-03 MED ORDER — ACETAMINOPHEN 325 MG PO TABS
650.0000 mg | ORAL_TABLET | Freq: Four times a day (QID) | ORAL | Status: DC | PRN
  Administered 2022-03-05: 650 mg via ORAL
  Filled 2022-03-03: qty 2

## 2022-03-03 MED ORDER — ONDANSETRON HCL 4 MG/2ML IJ SOLN: 4.0000 mg | Freq: Four times a day (QID) | INTRAMUSCULAR | Status: DC | PRN

## 2022-03-03 MED ORDER — SERTRALINE HCL 50 MG PO TABS
50.0000 mg | ORAL_TABLET | Freq: Every morning | ORAL | Status: DC
  Administered 2022-03-05 – 2022-03-07 (×3): 50 mg via ORAL
  Filled 2022-03-03 (×5): qty 1

## 2022-03-03 MED ORDER — HYDROXYCHLOROQUINE SULFATE 200 MG PO TABS
200.0000 mg | ORAL_TABLET | Freq: Every day | ORAL | Status: DC
  Administered 2022-03-05 – 2022-03-06 (×2): 200 mg via ORAL
  Filled 2022-03-03 (×5): qty 1

## 2022-03-03 MED ORDER — ACETAMINOPHEN 650 MG RE SUPP: 650.0000 mg | Freq: Four times a day (QID) | RECTAL | Status: DC | PRN

## 2022-03-03 MED ORDER — SODIUM CHLORIDE 0.9 % IV SOLN
500.0000 mg | INTRAVENOUS | Status: DC
  Administered 2022-03-03: 500 mg via INTRAVENOUS
  Filled 2022-03-03: qty 5

## 2022-03-03 MED ORDER — FLUTICASONE-UMECLIDIN-VILANT 100-62.5-25 MCG/INH IN AEPB: 1.0000 | INHALATION_SPRAY | Freq: Every morning | RESPIRATORY_TRACT | Status: DC

## 2022-03-03 MED ORDER — PANTOPRAZOLE SODIUM 20 MG PO TBEC
20.0000 mg | DELAYED_RELEASE_TABLET | Freq: Every day | ORAL | Status: DC
  Administered 2022-03-05 – 2022-03-07 (×3): 20 mg via ORAL
  Filled 2022-03-03 (×4): qty 1

## 2022-03-03 MED ORDER — HYDROCODONE-ACETAMINOPHEN 5-325 MG PO TABS: 1.0000 | ORAL_TABLET | ORAL | Status: DC | PRN

## 2022-03-03 MED ORDER — SODIUM CHLORIDE 0.9 % IV SOLN
500.0000 mg | INTRAVENOUS | Status: DC
  Administered 2022-03-04: 500 mg via INTRAVENOUS
  Filled 2022-03-03: qty 5

## 2022-03-03 MED ORDER — LACTATED RINGERS IV BOLUS (SEPSIS): 500.0000 mL | Freq: Once | INTRAVENOUS | Status: DC

## 2022-03-03 MED ORDER — INSULIN ASPART 100 UNIT/ML IJ SOLN: 0.0000 [IU] | Freq: Every day | INTRAMUSCULAR | Status: DC

## 2022-03-03 MED ORDER — LEVETIRACETAM 500 MG PO TABS
500.0000 mg | ORAL_TABLET | Freq: Two times a day (BID) | ORAL | Status: DC
  Administered 2022-03-05 – 2022-03-07 (×5): 500 mg via ORAL
  Filled 2022-03-03 (×6): qty 1

## 2022-03-03 MED ORDER — ENOXAPARIN SODIUM 40 MG/0.4ML IJ SOSY
40.0000 mg | PREFILLED_SYRINGE | INTRAMUSCULAR | Status: DC
  Administered 2022-03-04 – 2022-03-06 (×3): 40 mg via SUBCUTANEOUS
  Filled 2022-03-03 (×3): qty 0.4

## 2022-03-03 MED ORDER — SODIUM CHLORIDE 0.9 % IV SOLN
2.0000 g | INTRAVENOUS | Status: DC
  Administered 2022-03-03: 2 g via INTRAVENOUS
  Filled 2022-03-03: qty 20

## 2022-03-03 MED ORDER — ONDANSETRON HCL 4 MG PO TABS: 4.0000 mg | ORAL_TABLET | Freq: Four times a day (QID) | ORAL | Status: DC | PRN

## 2022-03-03 MED ORDER — IPRATROPIUM-ALBUTEROL 0.5-2.5 (3) MG/3ML IN SOLN
3.0000 mL | Freq: Four times a day (QID) | RESPIRATORY_TRACT | Status: DC
  Administered 2022-03-04 – 2022-03-06 (×6): 3 mL via RESPIRATORY_TRACT
  Filled 2022-03-03 (×7): qty 3

## 2022-03-03 MED ORDER — SODIUM CHLORIDE 0.9 % IV SOLN: 2.0000 g | INTRAVENOUS | Status: DC

## 2022-03-03 MED ORDER — GUAIFENESIN ER 600 MG PO TB12: 600.0000 mg | ORAL_TABLET | Freq: Two times a day (BID) | ORAL | Status: DC | PRN

## 2022-03-03 MED ORDER — SODIUM CHLORIDE 0.9 % IV BOLUS (SEPSIS): 500.0000 mL | Freq: Once | INTRAVENOUS | Status: DC

## 2022-03-03 MED ORDER — INSULIN ASPART 100 UNIT/ML IJ SOLN: 0.0000 [IU] | Freq: Three times a day (TID) | INTRAMUSCULAR | Status: DC

## 2022-03-03 NOTE — Assessment & Plan Note (Signed)
Sliding scale insulin coverage

## 2022-03-03 NOTE — Assessment & Plan Note (Signed)
Appears clinically dry.  BNP on the 100 Daily weights with intake and output monitoring

## 2022-03-03 NOTE — Assessment & Plan Note (Signed)
EKG nonacute

## 2022-03-03 NOTE — Assessment & Plan Note (Signed)
Creatinine 2.75 up from baseline of 1.66 IV hydration and monitor renal function, avoiding nephrotoxins and renally dosing all meds

## 2022-03-03 NOTE — ED Triage Notes (Signed)
Pt from [employer] r/t O2 saturation "in 50's." Baseline O2 is 2L [place], facility increased to 5L and was still low.  Breathing Tx increased O2 to "80's" Pt currently on 4L [place] sat is 94%

## 2022-03-03 NOTE — Assessment & Plan Note (Signed)
CPAP nightly

## 2022-03-03 NOTE — Assessment & Plan Note (Addendum)
Possible sepsis Chest x-ray showing possible right lower lobe pneumonia.  Respiratory viral panel negative for RSV COVID and flu Possible sepsis criteria include hypotension, AKI, respiratory failure Follow-up lactic acid and procalcitonin Continue Rocephin and azithromycin IV fluids Supplemental O2, incentive spirometry, albuterol as needed, antitussives

## 2022-03-03 NOTE — Assessment & Plan Note (Signed)
Complicating factor to overall prognosis and care

## 2022-03-03 NOTE — Assessment & Plan Note (Signed)
Not acutely exacerbated Continue home inhalers DuoNebs as needed 

## 2022-03-03 NOTE — Assessment & Plan Note (Signed)
Continue hydroxyzine and sertraline

## 2022-03-03 NOTE — Assessment & Plan Note (Signed)
Uncertain etiology, possibly decreased oral intake versus sepsis IV fluid resuscitation Hold all BP lowering meds Close hemodynamic monitoring

## 2022-03-03 NOTE — H&P (Signed)
History and Physical    Patient: Katherine [name] IRC:[id number] DOB: [birth year] DOA: 03/03/2022 DOS: the patient was seen and examined on 03/03/2022 PCP: [facility], [place]  Patient coming from: Home  Chief Complaint:  Chief Complaint  Patient presents with   Shortness of Breath    HPI: Katherine [name] is a 82 y.o. female with medical history significant for Class III obesity, cognitive dysfunction, CKD 4, seizure disorder, rheumatoid arthritis, anemia of chronic kidney disease, diastolic CHF, COPD on home O2 at 2 L, DM, cerebral atrophy, hospitalized in November 2023 with status epilepticus requiring intubation for airway protection, who [facility] she was found to be hypoxic with O2 sats in the 50s improving only to the 80s after being increased to 5 L.  History is limited due to cognitive dysfunction. ED course and data review: On arrival hypotensive at 81/60 with pulse 69 and afebrile.  O2 sat 94% on 4 L.  WBC 6100 with hemoglobin at baseline at 10.2.  Troponin 23 and BNP 89.6.[name]  Creatinine 2.75 up from baseline of 1.66.[name]  Respiratory viral panel negative for COVID flu and influenza EKG, independently reviewed and interpreted showing NSR at 69 with no ischemic ST-T wave changes.  Chest x-ray showing minimal patchy and strandy opacities in the right lower lung which could reflect atelectasis or infection. Patient was treated with an NS bolus of 500 mL and started on ceftriaxone and azithromycin and hospitalist consulted for admission.    Past Medical History:  Diagnosis Date   Acute encephalopathy 02/11/2019   Acute on chronic anemia 01/03/2019   Acute on chronic congestive heart failure (HCC)    Acute respiratory failure with hypoxia and hypercapnia (HCC) 04/19/2021   Angioedema    Anxiety and depression    Aphasia 06/28/2017   Cerebral atrophy ([place]) 06/19/2021   Chest pain 07/15/2018   CHF (congestive heart failure) (HCC)    Chronic constipation    Chronic kidney  disease, stage 4 (severe) ([place]) 06/19/2021   COPD (chronic obstructive pulmonary disease) ([place])    COPD exacerbation ([place]) 11/30/2017   COPD with acute exacerbation ([place]) 02/24/2018   COVID-19 12/21/2018   COVID-19 virus infection 12/21/2018   Delirium    Diabetes mellitus type 2, uncomplicated ([place]) 51/03/5850   Diabetes mellitus without complication (HCC)    Elevated rheumatoid factor 09/05/2017   Gallstones    GERD (gastroesophageal reflux disease)    Hematemesis 04/28/2018   Hemoptysis 12/20/2018   Hypertension    Influenza A 04/13/2018   Left knee pain 06/12/2016   Left ventricular hypertrophy    Localized edema 05/10/2018   Osteoarthritis    Pain in finger of left hand 09/12/2016   Pain in soft tissues of limb 05/20/2013   Formatting of this note might be different from the original. Last Assessment & Plan:  Handled by [facility] doctor; wearing boot   Palliative care encounter 05/10/2018   Possible Seizures ([place]) 10/08/2017   Pressure injury of skin 10/29/2017   Prurigo nodularis    Rheumatoid arthritis ([place])    on plaquenil   Right hand pain 03/21/2017   Seizures ([place])    Sepsis ([place]) 10/24/2017   Sepsis due to gram-negative UTI ([place]) 06/19/2019   Stroke ([place])    Stroke ([place]) 06/19/2019   Stroke-like episode ([place]) s/p IV tpa 10/04/2017   Tobacco abuse    Urinary tract infection 11/22/2017   UTI (urinary tract infection) 08/21/2018   Vitamin D deficiency disease    Past Surgical History:  Procedure Laterality Date   CHOLECYSTECTOMY     POLYPECTOMY  11/2011   vocal cord   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   Procedure: TOTAL KNEE ARTHROPLASTY;  Surgeon: Hessie Knows, MD;  Location: [facility] ORS;  Service: Orthopedics;  Laterality: Right;   Social History:  reports that she has quit smoking. Her smoking use included cigarettes. She has a 30.00 pack-year smoking history. She has never used smokeless tobacco. She reports that she does not drink alcohol and does not use  drugs.  Allergies  Allergen Reactions   Bee Venom Swelling and Other (See Comments)    "Allergic," per [facility]   Enalapril Maleate Swelling and Other (See Comments)    "Allergic," per [facility]    Family History  Problem Relation Age of Onset   Alcohol abuse Mother    Sickle cell anemia Daughter    Hypertension Son    Cancer Neg Hx    COPD Neg Hx    Diabetes Neg Hx    Heart disease Neg Hx    Stroke Neg Hx     Prior to Admission medications   Medication Sig Start Date End Date Taking? Authorizing Provider  Acetaminophen 325 MG CAPS Take 650 mg by mouth every 8 (eight) hours as needed (pain).    [provider]  Fluticasone-Umeclidin-Vilant 100-62.5-25 MCG/INH AEPB Inhale 1 puff into the lungs in the morning.    [provider]  gabapentin (NEURONTIN) 100 MG capsule Take 2 capsules (200 mg total) by mouth at bedtime. 06/21/21   Ezequiel Essex, MD  guaiFENesin (MUCINEX) 600 MG 12 hr tablet Take 600 mg by mouth every 12 (twelve) hours as needed for to loosen phlegm (or for secretions).    [provider]  hydroxychloroquine (PLAQUENIL) 200 MG tablet Take 200 mg by mouth See admin instructions. Take 200 mg by mouth at 5 PM daily 12/07/21   [provider]  hydrOXYzine (ATARAX) 25 MG tablet Take 25 mg by mouth 3 (three) times daily. 11/25/21   [provider]  ipratropium-albuterol (DUONEB) 0.5-2.5 (3) MG/3ML SOLN Take 3 mLs by nebulization every 6 (six) hours as needed. Patient taking differently: Take 3 mLs by nebulization See admin instructions. Nebulize 3 ml's and inhale into the lungs every 6 hours and and an additional 3 ml's every 6 hours as needed for shortness of breath 06/21/21   Ezequiel Essex, MD  lacosamide 100 MG TABS Take 1 tablet (100 mg total) by mouth 2 (two) times daily. 12/31/18   Thurnell Lose, MD  levETIRAcetam (KEPPRA) 500 MG tablet Take 1 tablet (500 mg total) by mouth 2 (two) times daily. Patient taking differently: Take 500 mg  by mouth every 12 (twelve) hours. 10/26/21 10/21/22  Alric Ran, MD  lidocaine (LIDODERM) 5 % Place 1 patch onto the skin See admin instructions. Apply 1 patch to left knee topically at bedtime and remove in the morning 01/06/21   [provider]  loratadine (CLARITIN) 10 MG tablet Take 10 mg by mouth daily at 6 PM.    [provider]  OXYGEN Inhale 3 L/min into the lungs continuous.    [provider]  pantoprazole (PROTONIX) 20 MG tablet Take 20 mg by mouth daily before breakfast. 04/15/21   [provider]  PRESCRIPTION MEDICATION See admin instructions. BiPAP- At bedtime    [provider]  propranolol (INDERAL) 10 MG tablet Take 10 mg by mouth 2 (two) times daily. 04/12/21   [provider]  sertraline (ZOLOFT) 50  MG tablet Take 1 tablet (50 mg total) by mouth daily. Patient taking differently: Take 50 mg by mouth in the morning. 06/22/21   Ezequiel Essex, MD  Skin Protectants, Misc. (EUCERIN) cream Apply 1 application  topically See admin instructions. Apply to bilateral feet at bedtime    [provider]  sodium chloride (OCEAN) 0.65 % SOLN nasal spray Place 3 sprays into both nostrils See admin instructions. Administer 3 sprays alternating nostrils three times a day for nasal dryness.    [provider]    Physical Exam: Vitals:   03/03/22 2030 03/03/22 2100 03/03/22 2130 03/03/22 2200  BP: (!) 86/61 (!) 89/64 (!) 89/56 96/64  Pulse: 65 64 63 64  Resp: (!) 26 (!) 28 (!) 24 20  Temp:      TempSrc:      SpO2: 97% 90% 95% 96%  Weight:      Height:       Physical Exam Vitals and nursing note reviewed.  Constitutional:      General: She is not in acute distress. HENT:     Head: Normocephalic and atraumatic.  Cardiovascular:     Rate and Rhythm: Normal rate and regular rhythm.     Heart sounds: Normal heart sounds.  Pulmonary:     Effort: Tachypnea present.     Breath sounds: Normal breath sounds.  Abdominal:      Palpations: Abdomen is soft.     Tenderness: There is no abdominal tenderness.  Neurological:     Mental Status: Mental status is at baseline.     Comments: Appears aphasic.  Answering all questions with nods     Labs on Admission: I have personally reviewed following labs and imaging studies  CBC: Recent Labs  Lab 03/03/22 1957  WBC 6.1  NEUTROABS 3.3  HGB 10.2*  HCT 34.6*  MCV 93.5  PLT 580   Basic Metabolic Panel: Recent Labs  Lab 03/03/22 1957  NA 139  K 4.7  CL 95*  CO2 35*  GLUCOSE 116*  BUN 49*  CREATININE 2.75*  CALCIUM 9.2   GFR: Estimated Creatinine Clearance: 18.6 mL/min (A) (by C-G formula based on SCr of 2.75 mg/dL (H)). Liver Function Tests: No results for input(s): "AST", "ALT", "ALKPHOS", "BILITOT", "PROT", "ALBUMIN" in the last 168 hours. No results for input(s): "LIPASE", "AMYLASE" in the last 168 hours. No results for input(s): "AMMONIA" in the last 168 hours. Coagulation Profile: No results for input(s): "INR", "PROTIME" in the last 168 hours. Cardiac Enzymes: No results for input(s): "CKTOTAL", "CKMB", "CKMBINDEX", "TROPONINI" in the last 168 hours. BNP (last 3 results) No results for input(s): "PROBNP" in the last 8760 hours. HbA1C: No results for input(s): "HGBA1C" in the last 72 hours. CBG: No results for input(s): "GLUCAP" in the last 168 hours. Lipid Profile: No results for input(s): "CHOL", "HDL", "LDLCALC", "TRIG", "CHOLHDL", "LDLDIRECT" in the last 72 hours. Thyroid Function Tests: No results for input(s): "TSH", "T4TOTAL", "FREET4", "T3FREE", "THYROIDAB" in the last 72 hours. Anemia Panel: No results for input(s): "VITAMINB12", "FOLATE", "FERRITIN", "TIBC", "IRON", "RETICCTPCT" in the last 72 hours. Urine analysis:    Component Value Date/Time   COLORURINE YELLOW (A) 12/17/2021 1547   APPEARANCEUR HAZY (A) 12/17/2021 1547   APPEARANCEUR Clear 02/20/2013 1459   LABSPEC 1.010 12/17/2021 1547   LABSPEC 1.012 02/20/2013  1459   PHURINE 5.0 12/17/2021 1547   GLUCOSEU NEGATIVE 12/17/2021 1547   GLUCOSEU Negative 02/20/2013 1459   HGBUR NEGATIVE 12/17/2021 1547   BILIRUBINUR NEGATIVE 12/17/2021  [place] Negative 02/20/2013 [place] 12/17/2021 1547   PROTEINUR NEGATIVE 12/17/2021 1547   NITRITE NEGATIVE 12/17/2021 1547   LEUKOCYTESUR MODERATE (A) 12/17/2021 1547   LEUKOCYTESUR Negative 02/20/2013 1459    Radiological Exams on Admission: DG Chest Portable 1 View  Result Date: 03/03/2022 CLINICAL DATA:  Shortness of breath EXAM: PORTABLE CHEST 1 VIEW COMPARISON:  Chest x-ray 12/23/2021 FINDINGS: There are minimal patchy and strandy opacities in the right lower lung. There is no pleural effusion or pneumothorax. The cardiomediastinal silhouette is within normal limits. No acute fractures are seen. IMPRESSION: Minimal patchy and strandy opacities in the right lower lung could reflect atelectasis or infection. Electronically Signed   By: Ronney Asters M.D.   On: 03/03/2022 20:20     Data Reviewed: Relevant notes from primary care and specialist visits, past discharge summaries as available in EHR, including Care Everywhere. Prior diagnostic testing as pertinent to current admission diagnoses Updated medications and problem lists for reconciliation ED course, including vitals, labs, imaging, treatment and response to treatment Triage notes, nursing and pharmacy notes and ED provider's notes Notable results as noted in HPI   Assessment and Plan: * CAP (community acquired pneumonia) Possible sepsis Chest x-ray showing possible right lower lobe pneumonia.  Respiratory viral panel negative for RSV COVID and flu Possible sepsis criteria include hypotension, AKI, respiratory failure Follow-up lactic acid and procalcitonin Continue Rocephin and azithromycin IV fluids Supplemental O2, incentive spirometry, albuterol as needed, antitussives  Acute on chronic respiratory failure with  hypoxia (HCC) O2 sats in the 50s with EMS on home flow rate requiring 5 L Suspect secondary to CAP, lower suspicion for PE as patient is without tachycardia Will treat for pneumonia as outlined below Unable to get CTA chest because of renal function Supplemental O2 to keep sats over 90%   Hypotension Uncertain etiology, possibly decreased oral intake versus sepsis IV fluid resuscitation Hold all BP lowering meds Close hemodynamic monitoring  Acute renal failure superimposed on stage 4 chronic kidney disease (HCC) Creatinine 2.75 up from baseline of 1.66 IV hydration and monitor renal function, avoiding nephrotoxins and renally dosing all meds  Seizure disorder (HCC) Continue lacosamide and Keppra and gabapentin History of status epilepticus in November requiring intubation for airway protection Seizure precautions   Chronic diastolic heart failure ([place]) Appears clinically dry.  BNP on the 100 Daily weights with intake and output monitoring  COPD (chronic obstructive pulmonary disease) (HCC) Not acutely exacerbated Continue home inhalers DuoNebs as needed  Diabetes mellitus type 2, controlled, with complications (HCC) Sliding scale insulin coverage  OSA (obstructive sleep apnea) CPAP nightly  Coronary artery disease EKG nonacute  Anxiety Continue hydroxyzine and sertraline  Obesity, Class III, BMI 40-49.9 (morbid obesity) ([place]) Complicating factor to overall prognosis and care        DVT prophylaxis: Lovenox  Consults: none  Advance Care Planning:   Code Status: Prior   Family Communication: none  Disposition Plan: Back to previous home environment  Severity of Illness: The appropriate patient status for this patient is INPATIENT. Inpatient status is judged to be reasonable and necessary in order to provide the required intensity of service to ensure the patient's safety. The patient's presenting symptoms, physical exam findings, and initial  radiographic and laboratory data in the context of their chronic comorbidities is felt to place them at high risk for further clinical deterioration. Furthermore, it is not anticipated that the patient will be medically stable for discharge from the hospital  within 2 midnights of admission.   * I certify that at the point of admission it is my clinical judgment that the patient will require inpatient hospital care spanning beyond 2 midnights from the point of admission due to high intensity of service, high risk for further deterioration and high frequency of surveillance required.*  Author: Athena Masse, MD 03/03/2022 10:25 PM  For on call review [email or web address].

## 2022-03-03 NOTE — ED Provider Notes (Signed)
[facility] Provider Note    Event Date/Time   First MD Initiated Contact with Patient 03/03/22 1926     (approximate)   History   Chief Complaint: Shortness of Breath   HPI  Katherine [name] is a 82 y.o. female with a history of diastolic heart failure, hypertension, obesity, COPD, diabetes, prior stroke with aphasia who was sent to the ED due to shortness of breath today with oxygen saturation observed to be in the 50s.  She is on 2 L nasal cannula chronically, required 4 L nasal cannula on arrival to the ED.  Patient denies pain.     Physical Exam   Triage Vital Signs: ED Triage Vitals  Enc Vitals Group     BP 03/03/22 1935 (!) 81/60     Pulse Rate 03/03/22 1935 69     Resp 03/03/22 1935 20     Temp 03/03/22 1935 98.2 F (36.8 C)     Temp Source 03/03/22 1935 Oral     SpO2 03/03/22 1935 94 %     Weight 03/03/22 1937 226 lb (102.5 kg)     Height 03/03/22 1937 5\' 3"  (1.6 m)     Head Circumference --      Peak Flow --      Pain Score --      Pain Loc --      Pain Edu? --      Excl. in [place]? --     Most recent vital signs: Vitals:   03/03/22 1935  BP: (!) 81/60  Pulse: 69  Resp: 20  Temp: 98.2 F (36.8 C)  SpO2: 94%    General: Awake, no distress.  CV:  Good peripheral perfusion.  Regular rate rhythm Resp:  Normal effort.  Diminished breath sounds right base. Abd:  No distention.  Soft nontender Other:  Dry mucous membranes.   ED Results / Procedures / Treatments   Labs (all labs ordered are listed, but only abnormal results are displayed) Labs Reviewed  RESP PANEL BY RT-PCR (RSV, FLU A&B, COVID)  [id number]  BASIC METABOLIC PANEL  BRAIN NATRIURETIC PEPTIDE  CBC WITH DIFFERENTIAL/PLATELET  TROPONIN I (HIGH SENSITIVITY)     EKG Interpreted by me Normal sinus rhythm rate of 69, left axis, normal intervals.  Normal QRS ST segments and T waves.   RADIOLOGY Chest x-ray interpreted by me, shows opacity in the right base  consistent with pneumonia, no effusion or edema.  Radiology report reviewed.   PROCEDURES:  Procedures   MEDICATIONS ORDERED IN ED: Medications - No data to display   IMPRESSION / MDM / [place] / ED COURSE  I reviewed the triage vital signs and the nursing notes.                              Differential diagnosis includes, but is not limited to, pneumonia, pleural effusion, pulmonary edema, COPD exacerbation, non-STEMI, viral illness.  Patient's presentation is most consistent with acute presentation with potential threat to life or bodily function.  Patient presents with acute on chronic respiratory failure, chest x-ray reveals pneumonia.  She is not septic.  Will start Rocephin and azithromycin, plan to admit.       FINAL CLINICAL IMPRESSION(S) / ED DIAGNOSES   Final diagnoses:  Community acquired pneumonia of right lower lobe of lung  Acute on chronic respiratory failure with hypoxia (HCC)  Morbid obesity (HCC)  Stage 3b chronic kidney  disease [facility])     Rx / DC Orders   ED Discharge Orders     None        Note:  This document was prepared using Dragon voice recognition software and may include unintentional dictation errors.   Carrie Mew, MD 03/03/22 2031

## 2022-03-03 NOTE — Assessment & Plan Note (Signed)
O2 sats in the 50s with EMS on home flow rate requiring 5 L Suspect secondary to CAP, lower suspicion for PE as patient is without tachycardia Will treat for pneumonia as outlined below Unable to get CTA chest because of renal function Supplemental O2 to keep sats over 90%

## 2022-03-03 NOTE — Progress Notes (Signed)
Anticoagulation monitoring(Lovenox):  82 yo  female ordered Lovenox 30 mg Q24h    Filed Weights   03/03/22 1937  Weight: 102.5 kg (226 lb)   BMI 40   Lab Results  Component Value Date   CREATININE 2.75 (H) 03/03/2022   CREATININE 1.66 (H) 12/26/2021   CREATININE 1.46 (H) 12/25/2021   Estimated Creatinine Clearance: 18.6 mL/min (A) (by C-G formula based on SCr of 2.75 mg/dL (H)). Hemoglobin & Hematocrit     Component Value Date/Time   HGB 10.2 (L) 03/03/2022 1957   HGB 12.6 03/27/2017 0948   HCT 34.6 (L) 03/03/2022 1957   HCT 37.9 03/27/2017 0948     Per Protocol for Patient with estCrcl < 30 ml/min and BMI > 30, will transition to Lovenox 40 mg Q24h.

## 2022-03-03 NOTE — Assessment & Plan Note (Signed)
Continue lacosamide and Keppra and gabapentin History of status epilepticus in November requiring intubation for airway protection Seizure precautions

## 2022-03-04 ENCOUNTER — Other Ambulatory Visit: Payer: Self-pay

## 2022-03-04 DIAGNOSIS — J9621 Acute and chronic respiratory failure with hypoxia: Secondary | ICD-10-CM | POA: Diagnosis not present

## 2022-03-04 DIAGNOSIS — I959 Hypotension, unspecified: Secondary | ICD-10-CM | POA: Diagnosis not present

## 2022-03-04 DIAGNOSIS — N184 Chronic kidney disease, stage 4 (severe): Secondary | ICD-10-CM

## 2022-03-04 DIAGNOSIS — J189 Pneumonia, unspecified organism: Secondary | ICD-10-CM

## 2022-03-04 DIAGNOSIS — N179 Acute kidney failure, unspecified: Secondary | ICD-10-CM

## 2022-03-04 LAB — BLOOD CULTURE ID PANEL (REFLEXED) - BCID2

## 2022-03-04 LAB — CBG MONITORING, ED
Glucose-Capillary: 79 mg/dL (ref 70–99)
Glucose-Capillary: 86 mg/dL (ref 70–99)
Glucose-Capillary: 87 mg/dL (ref 70–99)
Glucose-Capillary: 89 mg/dL (ref 70–99)
Glucose-Capillary: 98 mg/dL (ref 70–99)

## 2022-03-04 LAB — BASIC METABOLIC PANEL
Anion gap: 5 (ref 5–15)
BUN: 44 mg/dL — ABNORMAL HIGH (ref 8–23)
CO2: 35 mmol/L — ABNORMAL HIGH (ref 22–32)
Calcium: 8.5 mg/dL — ABNORMAL LOW (ref 8.9–10.3)
Chloride: 99 mmol/L (ref 98–111)
Creatinine, Ser: 2.44 mg/dL — ABNORMAL HIGH (ref 0.44–1.00)
GFR, Estimated: 20 mL/min — ABNORMAL LOW (ref >60)
Glucose, Bld: 92 mg/dL (ref 70–99)
Potassium: 5.3 mmol/L — ABNORMAL HIGH (ref 3.5–5.1)
Sodium: 139 mmol/L (ref 135–145)

## 2022-03-04 LAB — PROTIME-INR
INR: 1 (ref 0.8–1.2)
Prothrombin Time: 13.5 seconds (ref 11.4–15.2)

## 2022-03-04 LAB — CBC
HCT: 31.2 % — ABNORMAL LOW (ref 36.0–46.0)
Hemoglobin: 9.2 g/dL — ABNORMAL LOW (ref 12.0–15.0)
MCH: 27.9 pg (ref 26.0–34.0)
MCHC: 29.5 g/dL — ABNORMAL LOW (ref 30.0–36.0)
MCV: 94.5 fL (ref 80.0–100.0)
Platelets: 189 10*3/uL (ref 150–400)
RBC: 3.3 MIL/uL — ABNORMAL LOW (ref 3.87–5.11)
RDW: 13.2 % (ref 11.5–15.5)
WBC: 4.9 10*3/uL (ref 4.0–10.5)
nRBC: 0 % (ref 0.0–0.2)

## 2022-03-04 LAB — LACTIC ACID, PLASMA
Lactic Acid, Venous: 1.2 mmol/L (ref 0.5–1.9)
Lactic Acid, Venous: 0.8 mmol/L (ref 0.5–1.9)

## 2022-03-04 LAB — PROCALCITONIN
Procalcitonin: <0.1 ng/mL
Procalcitonin: <0.1 ng/mL

## 2022-03-04 LAB — CORTISOL-AM, BLOOD: Cortisol - AM: 8.8 ug/dL (ref 6.7–22.6)

## 2022-03-04 MED ORDER — FLUTICASONE FUROATE-VILANTEROL 100-25 MCG/ACT IN AEPB
1.0000 | INHALATION_SPRAY | Freq: Every day | RESPIRATORY_TRACT | Status: DC
  Administered 2022-03-06 – 2022-03-07 (×2): 1 via RESPIRATORY_TRACT
  Filled 2022-03-04 (×2): qty 28

## 2022-03-04 MED ORDER — LACTATED RINGERS IV SOLN: INTRAVENOUS | Status: AC

## 2022-03-04 MED ORDER — SODIUM CHLORIDE 0.9 % IV SOLN: INTRAVENOUS | Status: AC

## 2022-03-04 MED ORDER — SODIUM CHLORIDE 0.9 % IV SOLN: 500.0000 mg | INTRAVENOUS | Status: DC

## 2022-03-04 MED ORDER — UMECLIDINIUM BROMIDE 62.5 MCG/ACT IN AEPB
1.0000 | INHALATION_SPRAY | Freq: Every day | RESPIRATORY_TRACT | Status: DC
  Administered 2022-03-06 – 2022-03-07 (×2): 1 via RESPIRATORY_TRACT
  Filled 2022-03-04 (×2): qty 7

## 2022-03-04 MED ORDER — SODIUM CHLORIDE 0.9 % IV SOLN
1.0000 g | INTRAVENOUS | Status: DC
  Administered 2022-03-04 – 2022-03-05 (×2): 1 g via INTRAVENOUS
  Filled 2022-03-04 (×2): qty 10
  Filled 2022-03-04: qty 1

## 2022-03-04 NOTE — Progress Notes (Signed)
PHARMACY - PHYSICIAN COMMUNICATION CRITICAL VALUE ALERT - BLOOD CULTURE IDENTIFICATION (BCID)  Katherine [name] is an 82 y.o. female who presented to [facility] on 03/03/2022 with a chief complaint of CAP  Assessment: 1/3 bottles GPC. BCID detected Staphylococcus species. Suspect contaminant.  Name of physician (or Provider) Contacted: Dr. Mal Misty  Current antibiotics: Ceftriaxone + azithromycin  Changes to prescribed antibiotics recommended:  Patient is on recommended antibiotics - No changes needed  Results for orders placed or performed during the hospital encounter of 03/03/22  Blood Culture ID Panel (Reflexed) (Collected: 03/03/2022  9:11 PM)  Result Value Ref Range   Enterococcus faecalis NOT DETECTED NOT DETECTED   Enterococcus Faecium NOT DETECTED NOT DETECTED   Listeria monocytogenes NOT DETECTED NOT DETECTED   Staphylococcus species DETECTED (A) NOT DETECTED   Staphylococcus aureus (BCID) NOT DETECTED NOT DETECTED   Staphylococcus epidermidis NOT DETECTED NOT DETECTED   Staphylococcus lugdunensis NOT DETECTED NOT DETECTED   Streptococcus species NOT DETECTED NOT DETECTED   Streptococcus agalactiae NOT DETECTED NOT DETECTED   Streptococcus pneumoniae NOT DETECTED NOT DETECTED   Streptococcus pyogenes NOT DETECTED NOT DETECTED   A.calcoaceticus-baumannii NOT DETECTED NOT DETECTED   Bacteroides fragilis NOT DETECTED NOT DETECTED   Enterobacterales NOT DETECTED NOT DETECTED   Enterobacter cloacae complex NOT DETECTED NOT DETECTED   Escherichia coli NOT DETECTED NOT DETECTED   Klebsiella aerogenes NOT DETECTED NOT DETECTED   Klebsiella oxytoca NOT DETECTED NOT DETECTED   Klebsiella pneumoniae NOT DETECTED NOT DETECTED   Proteus species NOT DETECTED NOT DETECTED   Salmonella species NOT DETECTED NOT DETECTED   Serratia marcescens NOT DETECTED NOT DETECTED   Haemophilus influenzae NOT DETECTED NOT DETECTED   Neisseria meningitidis NOT DETECTED NOT DETECTED   Pseudomonas  aeruginosa NOT DETECTED NOT DETECTED   Stenotrophomonas maltophilia NOT DETECTED NOT DETECTED   Candida albicans NOT DETECTED NOT DETECTED   Candida auris NOT DETECTED NOT DETECTED   Candida glabrata NOT DETECTED NOT DETECTED   Candida krusei NOT DETECTED NOT DETECTED   Candida parapsilosis NOT DETECTED NOT DETECTED   Candida tropicalis NOT DETECTED NOT DETECTED   Cryptococcus neoformans/gattii NOT DETECTED NOT DETECTED    Benita Gutter 03/04/2022  2:19 PM

## 2022-03-04 NOTE — ED Notes (Signed)
Pt husband at bedside. Pt is more awake and crying at this time. Pt believes she is in a mental hospital. RN and husband tried to console pt and redirect. MD Mal Misty notified and RN requested MD to come to ED because husband has questions for pt. Pt remaining NPO until SLP can evaluate patient.

## 2022-03-04 NOTE — ED Notes (Signed)
This RN attempted to give pt pm meds, pt is alert, confused at baseline per prior shift RN, pt was given water and she began yelling at this RN demanding pineapple juice, explained to pt pineapple juice is not available and gave other juice and soda choices, pt continues to demand pineapple juice and then tells this RN to get out of her room, meds refused

## 2022-03-04 NOTE — ED Notes (Signed)
Pt purewick replaced. Pt bed pad changed. Pt somnolent and staying awake for brief moments. Pt able to assist in rolling for bed change but not answering questions verbally.

## 2022-03-04 NOTE — ED Notes (Signed)
SLP at bedside.

## 2022-03-04 NOTE — Evaluation (Signed)
Clinical/Bedside Swallow Evaluation Patient Details  Name: Katherine [name] MRN: [id number] Date of Birth: 1940-10-27  Today's Date: 03/04/2022 Time: SLP Start Time (ACUTE ONLY): 1120 SLP Stop Time (ACUTE ONLY): 1135 SLP Time Calculation (min) (ACUTE ONLY): 15 min  Past Medical History:  Past Medical History:  Diagnosis Date   Acute encephalopathy 02/11/2019   Acute on chronic anemia 01/03/2019   Acute on chronic congestive heart failure (HCC)    Acute respiratory failure with hypoxia and hypercapnia ([place]) 04/19/2021   Angioedema    Anxiety and depression    Aphasia 06/28/2017   Cerebral atrophy ([place]) 06/19/2021   Chest pain 07/15/2018   CHF (congestive heart failure) (HCC)    Chronic constipation    Chronic kidney disease, stage 4 (severe) ([place]) 06/19/2021   COPD (chronic obstructive pulmonary disease) ([place])    COPD exacerbation ([place]) 11/30/2017   COPD with acute exacerbation ([place]) 02/24/2018   COVID-19 12/21/2018   COVID-19 virus infection 12/21/2018   Delirium    Diabetes mellitus type 2, uncomplicated ([place]) 98/33/8250   Diabetes mellitus without complication (HCC)    Elevated rheumatoid factor 09/05/2017   Gallstones    GERD (gastroesophageal reflux disease)    Hematemesis 04/28/2018   Hemoptysis 12/20/2018   Hypertension    Influenza A 04/13/2018   Left knee pain 06/12/2016   Left ventricular hypertrophy    Localized edema 05/10/2018   Osteoarthritis    Pain in finger of left hand 09/12/2016   Pain in soft tissues of limb 05/20/2013   Formatting of this note might be different from the original. Last Assessment & Plan:  Handled by [facility] doctor; wearing boot   Palliative care encounter 05/10/2018   Possible Seizures ([place]) 10/08/2017   Pressure injury of skin 10/29/2017   Prurigo nodularis    Rheumatoid arthritis ([place])    on plaquenil   Right hand pain 03/21/2017   Seizures ([place])    Sepsis ([place]) 10/24/2017   Sepsis due to gram-negative UTI ([place]) 06/19/2019    Stroke ([place])    Stroke ([place]) 06/19/2019   Stroke-like episode ([place]) s/p IV tpa 10/04/2017   Tobacco abuse    Urinary tract infection 11/22/2017   UTI (urinary tract infection) 08/21/2018   Vitamin D deficiency disease    Past Surgical History:  Past Surgical History:  Procedure Laterality Date   CHOLECYSTECTOMY     POLYPECTOMY  11/2011   vocal cord   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   Procedure: TOTAL KNEE ARTHROPLASTY;  Surgeon: Hessie Knows, MD;  Location: [facility] ORS;  Service: Orthopedics;  Laterality: Right;   HPI:       Assessment / Plan / Recommendation  Clinical Impression  Pt presents with adquate oropharyngeal abilities when consuming thin liquids via straw, puree and minimal bites of graham crackers. While pt's usband endorsed swallowing difficulty with pt's nurse, he didn't report any when asked during this evaluation. Despite education by this Probation officer, pt's husband continued to demonstrate poor health literacy and appeared more concerned iwth pt's demands during the session. At this time, pt's aspiration risk is likely to fluctuate based on chronic AMS. SLP Visit Diagnosis: Dysphagia, unspecified (R13.10)    Aspiration Risk  Mild aspiration risk    Diet Recommendation Regular;Thin liquid   Liquid Administration via: Straw Medication Administration: Whole meds with liquid Supervision: Staff to assist with self feeding;Full supervision/cueing for compensatory strategies Compensations: Minimize environmental distractions;Slow rate;Small sips/bites Postural Changes: Seated upright at 90 degrees;Remain upright for at least 30 minutes after po intake  Other  Recommendations Oral Care Recommendations: Oral care BID    Recommendations for follow up therapy are one component of a multi-disciplinary discharge planning process, led by the attending physician.  Recommendations may be updated based on patient status, additional functional criteria and insurance  authorization.  Follow up Recommendations No SLP follow up      Assistance Recommended at Discharge    Functional Status Assessment    Frequency and Duration            Prognosis        Swallow Study   General Date of Onset: 03/03/22 Type of Study: Bedside Swallow Evaluation Previous Swallow Assessment: pt is well known to ST services Diet Prior to this Study: NPO Temperature Spikes Noted: No Respiratory Status: Nasal cannula History of Recent Intubation: No Behavior/Cognition: Alert;Confused;Uncooperative;Distractible;Doesn't follow directions (very HOH) Oral Cavity Assessment: Within Functional Limits Oral Care Completed by SLP: No Oral Cavity - Dentition: Edentulous Vision: Functional for self-feeding Self-Feeding Abilities: Needs assist Patient Positioning: Upright in bed Baseline Vocal Quality: Hoarse Volitional Cough: Cognitively unable to elicit Volitional Swallow: Unable to elicit    Oral/Motor/Sensory Function Overall Oral Motor/Sensory Function: Within functional limits   Ice Chips Ice chips: Not tested   Thin Liquid Thin Liquid: Within functional limits Presentation: Self Fed;Straw    Nectar Thick Nectar Thick Liquid: Not tested   Honey Thick Honey Thick Liquid: Not tested   Puree Puree: Within functional limits Presentation: Spoon   Solid     Solid: Within functional limits      [name] 03/04/2022,11:51 AM

## 2022-03-04 NOTE — ED Notes (Signed)
RN to bedside to administer morning medications and assess pt. Pt is somnolent with minimal arousal. RN sternal rubbed patient and patient was arousable for a brief moment and said "what are you doing awake so early" and fell back asleep. RN secure messaged MD Ayiku to determine if this was the same presentation he got this morning on his rounds this morning as well. Awaiting response. RN holding oral medications and recommended SLP consult to provider as well given presentation. Awaiting response.

## 2022-03-04 NOTE — ED Notes (Signed)
RN giving pt breathing tx with mask. Pt opened eye but did not respond to questions verbally and went back to sleep. MD Ayiku aware of pt status.

## 2022-03-04 NOTE — Progress Notes (Signed)
Progress Note    Katherine [name]  IWP:[id number] DOB: [birth year]  DOA: 03/03/2022 PCP: [facility] [place]      Brief Narrative:    Medical records reviewed and are as summarized below:  Katherine [name] is a 82 y.o. female  with medical history significant for Class III obesity, cognitive dysfunction, CKD 4, seizure disorder, rheumatoid arthritis, anemia of chronic kidney disease, diastolic CHF, COPD on home O2 at 2 L, DM, cerebral atrophy, hospitalized in November 2023 with status epilepticus requiring intubation for airway protection.  She presented to the hospital from the nursing home because of low oxygen saturation in the 50s.  Reportedly oxygen saturation only improved to the 80s after she was placed on 5 L/min oxygen.  She was hypotensive in the emergency room with BP of 81/60.  She was admitted to the hospital for community-acquired pneumonia complicated by acute hypoxic respiratory failure.     Assessment/Plan:   Principal Problem:   CAP (community acquired pneumonia) Active Problems:   Acute on chronic respiratory failure with hypoxia (HCC)   Hypotension   Acute renal failure superimposed on stage 4 chronic kidney disease (HCC)   Chronic diastolic heart failure (HCC)   Seizure disorder (HCC)   COPD (chronic obstructive pulmonary disease) (HCC)   Obesity, Class III, BMI 40-49.9 (morbid obesity) ([place])   Anxiety   Coronary artery disease   OSA (obstructive sleep apnea)   Diabetes mellitus type 2, controlled, with complications ([place])   Body mass index is 40.03 kg/m.  (Morbid obesity)  Community-acquired pneumonia: Continue empiric IV antibiotics.  Follow-up blood cultures.  Acute on chronic hypoxic respiratory failure.  Improving.  She is back on 2 L/min oxygen via [place] which is her baseline.  Hypotension: BP is still low.  Hydrate with IV fluids  AKI on CKD stage IV: Creatinine is trending down.  Continue to monitor.  Other comorbidities include seizure  disorder, chronic diastolic CHF, COPD, type II DM, OSA on CPAP at night, CAD, anxiety    Diet Order             Diet heart healthy/carb modified Room service appropriate? Yes; Fluid consistency: Thin  Diet effective now                            Consultants: None  Procedures: None    Medications:    enoxaparin (LOVENOX) injection  40 mg Subcutaneous Q24H   fluticasone furoate-vilanterol  1 puff Inhalation Daily   guaiFENesin  600 mg Oral BID   hydroxychloroquine  200 mg Oral QAC supper   insulin aspart  0-20 Units Subcutaneous TID WC   insulin aspart  0-5 Units Subcutaneous QHS   ipratropium-albuterol  3 mL Nebulization Q6H   levETIRAcetam  500 mg Oral Q12H   pantoprazole  20 mg Oral QAC breakfast   sertraline  50 mg Oral q AM   umeclidinium bromide  1 puff Inhalation Daily   Continuous Infusions:  azithromycin     cefTRIAXone (ROCEPHIN)  IV     lactated ringers     sodium chloride       Anti-infectives (From admission, onward)    Start     Dose/Rate Route Frequency Ordered Stop   03/04/22 2100  cefTRIAXone (ROCEPHIN) 2 g in sodium chloride 0.9 % 100 mL IVPB  Status:  Discontinued        2 g 200 mL/hr over 30 Minutes Intravenous Every 24 hours 03/03/22  2245 03/04/22 0243   03/04/22 2100  azithromycin (ZITHROMAX) 500 mg in sodium chloride 0.9 % 250 mL IVPB        500 mg 250 mL/hr over 60 Minutes Intravenous Every 24 hours 03/03/22 2245 03/08/22 2059   03/04/22 2100  cefTRIAXone (ROCEPHIN) 1 g in sodium chloride 0.9 % 100 mL IVPB        1 g 200 mL/hr over 30 Minutes Intravenous Every 24 hours 03/04/22 0224     03/04/22 1700  hydroxychloroquine (PLAQUENIL) tablet 200 mg        200 mg Oral Daily before supper 03/03/22 2245     03/04/22 0230  azithromycin (ZITHROMAX) 500 mg in sodium chloride 0.9 % 250 mL IVPB  Status:  Discontinued        500 mg 250 mL/hr over 60 Minutes Intravenous Every 24 hours 03/04/22 0224 03/04/22 0242   03/03/22 2045   cefTRIAXone (ROCEPHIN) 2 g in sodium chloride 0.9 % 100 mL IVPB  Status:  Discontinued        2 g 200 mL/hr over 30 Minutes Intravenous Every 24 hours 03/03/22 2032 03/03/22 2250   03/03/22 2045  azithromycin (ZITHROMAX) 500 mg in sodium chloride 0.9 % 250 mL IVPB  Status:  Discontinued        500 mg 250 mL/hr over 60 Minutes Intravenous Every 24 hours 03/03/22 2032 03/03/22 2250              Family Communication/Anticipated D/C date and plan/Code Status   DVT prophylaxis: enoxaparin (LOVENOX) injection 40 mg Start: 03/03/22 2300     Code Status: DNR  Family Communication: Her husband at the bedside Disposition Plan: Plan to discharge to SNF in 2 to 3 days   Status is: Inpatient Remains inpatient appropriate because: IV antibiotics for pneumonia       Subjective:   Interval events noted.  Patient was more drowsy earlier this morning but she became more awake later in the day.  She is confused unable to provide any history.  Objective:    Vitals:   03/04/22 0800 03/04/22 0900 03/04/22 1000 03/04/22 1100  BP: 121/65 100/64 116/65 119/62  Pulse: 61 61 62 62  Resp: 16 19 18 18   Temp:  97.6 F (36.4 C)    TempSrc:      SpO2: 97% 96% 98% 92%  Weight:      Height:       No data found.   Intake/Output Summary (Last 24 hours) at 03/04/2022 1203 Last data filed at 03/04/2022 7408 Gross per 24 hour  Intake 568.76 ml  Output --  Net 568.76 ml   Filed Weights   03/03/22 1937  Weight: 102.5 kg    Exam:  GEN: NAD SKIN: Warm and dry EYES: No pallor or icterus ENT: MMM, hearing impairment CV: RRR PULM: CTA B ABD: soft, obese, NT, +BS CNS: AAO x 1 (person), non focal EXT: No edema or tenderness        Data Reviewed:   I have personally reviewed following labs and imaging studies:  Labs: Labs show the following:   Basic Metabolic Panel: Recent Labs  Lab 03/03/22 1957 03/04/22 0754  NA 139 139  K 4.7 5.3*  CL 95* 99  CO2 35* 35*   GLUCOSE 116* 92  BUN 49* 44*  CREATININE 2.75* 2.44*  CALCIUM 9.2 8.5*   GFR Estimated Creatinine Clearance: 21 mL/min (A) (by C-G formula based on SCr of 2.44 mg/dL (H)). Liver Function Tests: No  results for input(s): "AST", "ALT", "ALKPHOS", "BILITOT", "PROT", "ALBUMIN" in the last 168 hours. No results for input(s): "LIPASE", "AMYLASE" in the last 168 hours. No results for input(s): "AMMONIA" in the last 168 hours. Coagulation profile Recent Labs  Lab 03/04/22 0754  INR 1.0    CBC: Recent Labs  Lab 03/03/22 1957 03/04/22 0754  WBC 6.1 4.9  NEUTROABS 3.3  --   HGB 10.2* 9.2*  HCT 34.6* 31.2*  MCV 93.5 94.5  PLT 247 189   Cardiac Enzymes: No results for input(s): "CKTOTAL", "CKMB", "CKMBINDEX", "TROPONINI" in the last 168 hours. BNP (last 3 results) No results for input(s): "PROBNP" in the last 8760 hours. CBG: Recent Labs  Lab 03/04/22 0008 03/04/22 0726 03/04/22 1117  GLUCAP 98 89 87   D-Dimer: No results for input(s): "DDIMER" in the last 72 hours. Hgb A1c: No results for input(s): "HGBA1C" in the last 72 hours. Lipid Profile: No results for input(s): "CHOL", "HDL", "LDLCALC", "TRIG", "CHOLHDL", "LDLDIRECT" in the last 72 hours. Thyroid function studies: No results for input(s): "TSH", "T4TOTAL", "T3FREE", "THYROIDAB" in the last 72 hours.  Invalid input(s): "FREET3" Anemia work up: No results for input(s): "VITAMINB12", "FOLATE", "FERRITIN", "TIBC", "IRON", "RETICCTPCT" in the last 72 hours. Sepsis Labs: Recent Labs  Lab 03/03/22 1957 03/03/22 2202 03/04/22 0059 03/04/22 0754  PROCALCITON  --  <0.10  --  <0.10  WBC 6.1  --   --  4.9  LATICACIDVEN  --   --  1.2 0.8    Microbiology Recent Results (from the past 240 hour(s))  Resp panel by RT-PCR (RSV, Flu A&B, Covid) Anterior Nasal Swab     Status: None   Collection Time: 03/03/22  7:57 PM   Specimen: Anterior Nasal Swab  Result Value Ref Range Status   SARS Coronavirus 2 by RT PCR NEGATIVE  NEGATIVE Final    Comment: (NOTE) SARS-CoV-2 target nucleic acids are NOT DETECTED.  The SARS-CoV-2 RNA is generally detectable in upper respiratory specimens during the acute phase of infection. The lowest concentration of SARS-CoV-2 viral copies this assay can detect is 138 copies/mL. A negative result does not preclude SARS-Cov-2 infection and should not be used as the sole basis for treatment or other patient management decisions. A negative result may occur with  improper specimen collection/handling, submission of specimen other than nasopharyngeal swab, presence of viral mutation(s) within the areas targeted by this assay, and inadequate number of viral copies(<138 copies/mL). A negative result must be combined with clinical observations, patient history, and epidemiological information. The expected result is Negative.  Fact Sheet for Patients:  EntrepreneurPulse.com.au  Fact Sheet for Healthcare Providers:  IncredibleEmployment.be  This test is no t yet approved or cleared by the [place] FDA and  has been authorized for detection and/or diagnosis of SARS-CoV-2 by FDA under an Emergency Use Authorization (EUA). This EUA will remain  in effect (meaning this test can be used) for the duration of the COVID-19 declaration under Section 564(b)(1) of the Act, 21 U.S.C.section 360bbb-3(b)(1), unless the authorization is terminated  or revoked sooner.       Influenza A by PCR NEGATIVE NEGATIVE Final   Influenza B by PCR NEGATIVE NEGATIVE Final    Comment: (NOTE) The Xpert Xpress SARS-CoV-2/FLU/RSV plus assay is intended as an aid in the diagnosis of influenza from Nasopharyngeal swab specimens and should not be used as a sole basis for treatment. Nasal washings and aspirates are unacceptable for Xpert Xpress SARS-CoV-2/FLU/RSV testing.  Fact Sheet for Patients: EntrepreneurPulse.com.au  Fact Sheet for Healthcare  Providers: IncredibleEmployment.be  This test is not yet approved or cleared by the [place] FDA and has been authorized for detection and/or diagnosis of SARS-CoV-2 by FDA under an Emergency Use Authorization (EUA). This EUA will remain in effect (meaning this test can be used) for the duration of the COVID-19 declaration under Section 564(b)(1) of the Act, 21 U.S.C. section 360bbb-3(b)(1), unless the authorization is terminated or revoked.     Resp Syncytial Virus by PCR NEGATIVE NEGATIVE Final    Comment: (NOTE) Fact Sheet for Patients: EntrepreneurPulse.com.au  Fact Sheet for Healthcare Providers: IncredibleEmployment.be  This test is not yet approved or cleared by the [place] FDA and has been authorized for detection and/or diagnosis of SARS-CoV-2 by FDA under an Emergency Use Authorization (EUA). This EUA will remain in effect (meaning this test can be used) for the duration of the COVID-19 declaration under Section 564(b)(1) of the Act, 21 U.S.C. section 360bbb-3(b)(1), unless the authorization is terminated or revoked.  Performed at [facility], [address] [place], [place] [address]   Culture, blood (single)     Status: None (Preliminary result)   Collection Time: 03/03/22  9:11 PM   Specimen: BLOOD  Result Value Ref Range Status   Specimen Description BLOOD BLOOD LEFT FOREARM  Final   Special Requests   Final    BOTTLES DRAWN AEROBIC AND ANAEROBIC Blood Culture results may not be optimal due to an inadequate volume of blood received in culture bottles   Culture   Final    NO GROWTH < 12 HOURS Performed at [facility], [address] [place], [place] [address]    Report Status PENDING  Incomplete  Culture, blood (Routine X 2) w Reflex to ID Panel     Status: None (Preliminary result)   Collection Time: 03/04/22 12:59 AM   Specimen: BLOOD  Result Value Ref Range Status    Specimen Description BLOOD BLOOD LEFT WRIST  Final   Special Requests IN PEDIATRIC BOTTLE Blood Culture adequate volume  Final   Culture   Final    NO GROWTH < 12 HOURS Performed at [facility], [address] [place], [place] [address]    Report Status PENDING  Incomplete    Procedures and diagnostic studies:  DG Chest Portable 1 View  Result Date: 03/03/2022 CLINICAL DATA:  Shortness of breath EXAM: PORTABLE CHEST 1 VIEW COMPARISON:  Chest x-ray 12/23/2021 FINDINGS: There are minimal patchy and strandy opacities in the right lower lung. There is no pleural effusion or pneumothorax. The cardiomediastinal silhouette is within normal limits. No acute fractures are seen. IMPRESSION: Minimal patchy and strandy opacities in the right lower lung could reflect atelectasis or infection. Electronically Signed   By: Ronney Asters M.D.   On: 03/03/2022 20:20               LOS: 1 day   Caress Reffitt  [facility]   Pager on [email or web address]. If 7PM-7AM, please contact night-coverage at [email or web address]     03/04/2022, 12:03 PM

## 2022-03-05 ENCOUNTER — Encounter: Payer: Self-pay | Admitting: Internal Medicine

## 2022-03-05 DIAGNOSIS — I5032 Chronic diastolic (congestive) heart failure: Secondary | ICD-10-CM

## 2022-03-05 DIAGNOSIS — J9621 Acute and chronic respiratory failure with hypoxia: Secondary | ICD-10-CM | POA: Diagnosis not present

## 2022-03-05 DIAGNOSIS — J189 Pneumonia, unspecified organism: Secondary | ICD-10-CM | POA: Diagnosis not present

## 2022-03-05 DIAGNOSIS — N179 Acute kidney failure, unspecified: Secondary | ICD-10-CM | POA: Diagnosis not present

## 2022-03-05 LAB — BASIC METABOLIC PANEL
Anion gap: 3 — ABNORMAL LOW (ref 5–15)
BUN: 36 mg/dL — ABNORMAL HIGH (ref 8–23)
CO2: 35 mmol/L — ABNORMAL HIGH (ref 22–32)
Calcium: 8.6 mg/dL — ABNORMAL LOW (ref 8.9–10.3)
Chloride: 105 mmol/L (ref 98–111)
Creatinine, Ser: 1.96 mg/dL — ABNORMAL HIGH (ref 0.44–1.00)
GFR, Estimated: 25 mL/min — ABNORMAL LOW (ref >60)
Glucose, Bld: 87 mg/dL (ref 70–99)
Potassium: 4.4 mmol/L (ref 3.5–5.1)
Sodium: 143 mmol/L (ref 135–145)

## 2022-03-05 LAB — CBG MONITORING, ED
Glucose-Capillary: 75 mg/dL (ref 70–99)
Glucose-Capillary: 95 mg/dL (ref 70–99)

## 2022-03-05 LAB — PROCALCITONIN: Procalcitonin: <0.1 ng/mL

## 2022-03-05 LAB — GLUCOSE, CAPILLARY
Glucose-Capillary: 101 mg/dL — ABNORMAL HIGH (ref 70–99)
Glucose-Capillary: 77 mg/dL (ref 70–99)

## 2022-03-05 MED ORDER — ALUM & MAG HYDROXIDE-SIMETH 200-200-20 MG/5ML PO SUSP: 30.0000 mL | Freq: Four times a day (QID) | ORAL | Status: DC | PRN

## 2022-03-05 MED ORDER — AZITHROMYCIN 250 MG PO TABS
500.0000 mg | ORAL_TABLET | Freq: Every day | ORAL | Status: DC
  Administered 2022-03-05 – 2022-03-06 (×2): 500 mg via ORAL
  Filled 2022-03-05 (×2): qty 2

## 2022-03-05 NOTE — Progress Notes (Signed)
PHARMACIST - PHYSICIAN COMMUNICATION  CONCERNING: Antibiotic IV to Oral Route Change Policy  RECOMMENDATION: This patient is receiving azithromycin by the intravenous route.  Based on criteria approved by the Pharmacy and Therapeutics Committee, the antibiotic(s) is/are being converted to the equivalent oral dose form(s).  DESCRIPTION: These criteria include: Patient being treated for a respiratory tract infection, urinary tract infection, cellulitis or clostridium difficile associated diarrhea if on metronidazole The patient is not neutropenic and does not exhibit a GI malabsorption state The patient is eating (either orally or via tube) and/or has been taking other orally administered medications for a least 24 hours The patient is improving clinically and has a Tmax < 100.5  If you have questions about this conversion, please contact the Pharmacy Department   Kadon Andrus B Melane Windholz 03/05/22     

## 2022-03-05 NOTE — ED Notes (Signed)
Pt placed on bedpan for BM.

## 2022-03-05 NOTE — Progress Notes (Signed)
Progress Note    Katherine [name]  GUY:[id number] DOB: [birth year]  DOA: 03/03/2022 PCP: [facility]      Brief Narrative:    Medical records reviewed and are as summarized below:  Katherine [name] is a 82 y.o. female  with medical history significant for Class III obesity, cognitive dysfunction, CKD 4, seizure disorder, rheumatoid arthritis, anemia of chronic kidney disease, diastolic CHF, COPD on home O2 at 2 L, DM, cerebral atrophy, hospitalized in November 2023 with status epilepticus requiring intubation for airway protection.  She presented to the hospital from the nursing home because of low oxygen saturation in the 50s.  Reportedly oxygen saturation only improved to the 80s after she was placed on 5 L/min oxygen.  She was hypotensive in the emergency room with BP of 81/60.  She was admitted to the hospital for community-acquired pneumonia complicated by acute hypoxic respiratory failure.     Assessment/Plan:   Principal Problem:   CAP (community acquired pneumonia) Active Problems:   Acute on chronic respiratory failure with hypoxia (HCC)   Hypotension   Acute renal failure superimposed on stage 4 chronic kidney disease (HCC)   Chronic diastolic heart failure (HCC)   Seizure disorder (HCC)   COPD (chronic obstructive pulmonary disease) (HCC)   Obesity, Class III, BMI 40-49.9 (morbid obesity) ([place])   Anxiety   Coronary artery disease   OSA (obstructive sleep apnea)   Diabetes mellitus type 2, controlled, with complications ([place])   Body mass index is 40.03 kg/m.  (Morbid obesity)  Community-acquired pneumonia: Continue IV ceftriaxone and oral azithromycin.  1 out of 3 of blood culture bottles growing staph species which is suspected to be a contaminant.  Species identification and susceptibility report is pending.  Acute on chronic hypoxic respiratory failure, chronic hypercapnic respiratory failure: ABG was ordered by respiratory therapist could not obtain  ABG today.  Chart review shows patient has chronic hypoxic and hypercapnic respiratory failure.  Last ABG on the chart was on 12/17/2021.  ABG showed pH of 7.385, pCO2 55.8.  Serum bicarbonate is 35 on 03/05/2022. Continue 2 L/min oxygen via nasal cannula.  She may qualify for trilogy machine.  Consult social worker to assist with obtaining trilogy machine.  Hypotension: Improved.  Discontinue IV fluids.  AKI on CKD stage IV: Creatinine is trending down.  Continue to monitor.  Other comorbidities include seizure disorder (on Keppra), chronic diastolic CHF, COPD, type II DM, OSA on CPAP at night, CAD, anxiety    Diet Order             Diet heart healthy/carb modified Room service appropriate? Yes; Fluid consistency: Thin  Diet effective now                            Consultants: None  Procedures: None    Medications:    azithromycin  500 mg Oral Daily   enoxaparin (LOVENOX) injection  40 mg Subcutaneous Q24H   fluticasone furoate-vilanterol  1 puff Inhalation Daily   guaiFENesin  600 mg Oral BID   hydroxychloroquine  200 mg Oral QAC supper   insulin aspart  0-20 Units Subcutaneous TID WC   insulin aspart  0-5 Units Subcutaneous QHS   ipratropium-albuterol  3 mL Nebulization Q6H   levETIRAcetam  500 mg Oral Q12H   pantoprazole  20 mg Oral QAC breakfast   sertraline  50 mg Oral q AM   umeclidinium bromide  1 puff Inhalation  Daily   Continuous Infusions:  cefTRIAXone (ROCEPHIN)  IV Stopped (03/04/22 2256)     Anti-infectives (From admission, onward)    Start     Dose/Rate Route Frequency Ordered Stop   03/05/22 1800  azithromycin (ZITHROMAX) tablet 500 mg        500 mg Oral Daily 03/05/22 0742 03/08/22 1759   03/04/22 2100  cefTRIAXone (ROCEPHIN) 2 g in sodium chloride 0.9 % 100 mL IVPB  Status:  Discontinued        2 g 200 mL/hr over 30 Minutes Intravenous Every 24 hours 03/03/22 2245 03/04/22 0243   03/04/22 2100  azithromycin (ZITHROMAX) 500 mg in  sodium chloride 0.9 % 250 mL IVPB  Status:  Discontinued        500 mg 250 mL/hr over 60 Minutes Intravenous Every 24 hours 03/03/22 2245 03/05/22 0742   03/04/22 2100  cefTRIAXone (ROCEPHIN) 1 g in sodium chloride 0.9 % 100 mL IVPB        1 g 200 mL/hr over 30 Minutes Intravenous Every 24 hours 03/04/22 0224     03/04/22 1700  hydroxychloroquine (PLAQUENIL) tablet 200 mg        200 mg Oral Daily before supper 03/03/22 2245     03/04/22 0230  azithromycin (ZITHROMAX) 500 mg in sodium chloride 0.9 % 250 mL IVPB  Status:  Discontinued        500 mg 250 mL/hr over 60 Minutes Intravenous Every 24 hours 03/04/22 0224 03/04/22 0242   03/03/22 2045  cefTRIAXone (ROCEPHIN) 2 g in sodium chloride 0.9 % 100 mL IVPB  Status:  Discontinued        2 g 200 mL/hr over 30 Minutes Intravenous Every 24 hours 03/03/22 2032 03/03/22 2250   03/03/22 2045  azithromycin (ZITHROMAX) 500 mg in sodium chloride 0.9 % 250 mL IVPB  Status:  Discontinued        500 mg 250 mL/hr over 60 Minutes Intravenous Every 24 hours 03/03/22 2032 03/03/22 2250              Family Communication/Anticipated D/C date and plan/Code Status   DVT prophylaxis: enoxaparin (LOVENOX) injection 40 mg Start: 03/03/22 2300     Code Status: DNR  Family Communication: None Disposition Plan: Plan to discharge to SNF in 2 to 3 days   Status is: Inpatient Remains inpatient appropriate because: IV antibiotics for pneumonia       Subjective:   Interval events noted.  Patient is confused and there were times when she was yelling at me.    Objective:    Vitals:   03/05/22 0515 03/05/22 0700 03/05/22 0800 03/05/22 0900  BP:  129/70 132/83 132/73  Pulse:  75 73 77  Resp:  20 (!) 21 (!) 21  Temp: 97.9 F (36.6 C)  97.6 F (36.4 C)   TempSrc: Axillary  Oral   SpO2:  100% 100% 98%  Weight:      Height:       No data found.   Intake/Output Summary (Last 24 hours) at 03/05/2022 1148 Last data filed at 03/04/2022  1713 Gross per 24 hour  Intake --  Output 600 ml  Net -600 ml   Filed Weights   03/03/22 1937  Weight: 102.5 kg    Exam:   GEN: NAD SKIN: Warm and dry EYES: No pallor or icterus ENT: MMM, hard of hearing CV: RRR PULM: CTA B ABD: soft, obese, NT, +BS CNS: AAO x 1 (person), non focal EXT: No edema  or tenderness       Data Reviewed:   I have personally reviewed following labs and imaging studies:  Labs: Labs show the following:   Basic Metabolic Panel: Recent Labs  Lab 03/03/22 1957 03/04/22 0754 03/05/22 0423  NA 139 139 143  K 4.7 5.3* 4.4  CL 95* 99 105  CO2 35* 35* 35*  GLUCOSE 116* 92 87  BUN 49* 44* 36*  CREATININE 2.75* 2.44* 1.96*  CALCIUM 9.2 8.5* 8.6*   GFR Estimated Creatinine Clearance: 26.2 mL/min (A) (by C-G formula based on SCr of 1.96 mg/dL (H)). Liver Function Tests: No results for input(s): "AST", "ALT", "ALKPHOS", "BILITOT", "PROT", "ALBUMIN" in the last 168 hours. No results for input(s): "LIPASE", "AMYLASE" in the last 168 hours. No results for input(s): "AMMONIA" in the last 168 hours. Coagulation profile Recent Labs  Lab 03/04/22 0754  INR 1.0    CBC: Recent Labs  Lab 03/03/22 1957 03/04/22 0754  WBC 6.1 4.9  NEUTROABS 3.3  --   HGB 10.2* 9.2*  HCT 34.6* 31.2*  MCV 93.5 94.5  PLT 247 189   Cardiac Enzymes: No results for input(s): "CKTOTAL", "CKMB", "CKMBINDEX", "TROPONINI" in the last 168 hours. BNP (last 3 results) No results for input(s): "PROBNP" in the last 8760 hours. CBG: Recent Labs  Lab 03/04/22 1117 03/04/22 1653 03/04/22 2215 03/05/22 0746 03/05/22 1126  GLUCAP 87 86 79 75 95   D-Dimer: No results for input(s): "DDIMER" in the last 72 hours. Hgb A1c: No results for input(s): "HGBA1C" in the last 72 hours. Lipid Profile: No results for input(s): "CHOL", "HDL", "LDLCALC", "TRIG", "CHOLHDL", "LDLDIRECT" in the last 72 hours. Thyroid function studies: No results for input(s): "TSH", "T4TOTAL",  "T3FREE", "THYROIDAB" in the last 72 hours.  Invalid input(s): "FREET3" Anemia work up: No results for input(s): "VITAMINB12", "FOLATE", "FERRITIN", "TIBC", "IRON", "RETICCTPCT" in the last 72 hours. Sepsis Labs: Recent Labs  Lab 03/03/22 1957 03/03/22 2202 03/04/22 0059 03/04/22 0754 03/05/22 0423  PROCALCITON  --  <0.10  --  <0.10 <0.10  WBC 6.1  --   --  4.9  --   LATICACIDVEN  --   --  1.2 0.8  --     Microbiology Recent Results (from the past 240 hour(s))  Resp panel by RT-PCR (RSV, Flu A&B, Covid) Anterior Nasal Swab     Status: None   Collection Time: 03/03/22  7:57 PM   Specimen: Anterior Nasal Swab  Result Value Ref Range Status   SARS Coronavirus 2 by RT PCR NEGATIVE NEGATIVE Final    Comment: (NOTE) SARS-CoV-2 target nucleic acids are NOT DETECTED.  The SARS-CoV-2 RNA is generally detectable in upper respiratory specimens during the acute phase of infection. The lowest concentration of SARS-CoV-2 viral copies this assay can detect is 138 copies/mL. A negative result does not preclude SARS-Cov-2 infection and should not be used as the sole basis for treatment or other patient management decisions. A negative result may occur with  improper specimen collection/handling, submission of specimen other than nasopharyngeal swab, presence of viral mutation(s) within the areas targeted by this assay, and inadequate number of viral copies(<138 copies/mL). A negative result must be combined with clinical observations, patient history, and epidemiological information. The expected result is Negative.  Fact Sheet for Patients:  EntrepreneurPulse.com.au  Fact Sheet for Healthcare Providers:  IncredibleEmployment.be  This test is no t yet approved or cleared by the [place] FDA and  has been authorized for detection and/or diagnosis of SARS-CoV-2 by FDA  under an Emergency Use Authorization (EUA). This EUA will remain  in effect  (meaning this test can be used) for the duration of the COVID-19 declaration under Section 564(b)(1) of the Act, 21 U.S.C.section 360bbb-3(b)(1), unless the authorization is terminated  or revoked sooner.       Influenza A by PCR NEGATIVE NEGATIVE Final   Influenza B by PCR NEGATIVE NEGATIVE Final    Comment: (NOTE) The Xpert Xpress SARS-CoV-2/FLU/RSV plus assay is intended as an aid in the diagnosis of influenza from Nasopharyngeal swab specimens and should not be used as a sole basis for treatment. Nasal washings and aspirates are unacceptable for Xpert Xpress SARS-CoV-2/FLU/RSV testing.  Fact Sheet for Patients: EntrepreneurPulse.com.au  Fact Sheet for Healthcare Providers: IncredibleEmployment.be  This test is not yet approved or cleared by the [place] FDA and has been authorized for detection and/or diagnosis of SARS-CoV-2 by FDA under an Emergency Use Authorization (EUA). This EUA will remain in effect (meaning this test can be used) for the duration of the COVID-19 declaration under Section 564(b)(1) of the Act, 21 U.S.C. section 360bbb-3(b)(1), unless the authorization is terminated or revoked.     Resp Syncytial Virus by PCR NEGATIVE NEGATIVE Final    Comment: (NOTE) Fact Sheet for Patients: EntrepreneurPulse.com.au  Fact Sheet for Healthcare Providers: IncredibleEmployment.be  This test is not yet approved or cleared by the [place] FDA and has been authorized for detection and/or diagnosis of SARS-CoV-2 by FDA under an Emergency Use Authorization (EUA). This EUA will remain in effect (meaning this test can be used) for the duration of the COVID-19 declaration under Section 564(b)(1) of the Act, 21 U.S.C. section 360bbb-3(b)(1), unless the authorization is terminated or revoked.  Performed at [facility], [address] [place], [place] [address]   Culture, blood  (single)     Status: None (Preliminary result)   Collection Time: 03/03/22  9:11 PM   Specimen: BLOOD  Result Value Ref Range Status   Specimen Description   Final    BLOOD BLOOD LEFT FOREARM Performed at [facility], [address], [place], [place] [address]    Special Requests   Final    BOTTLES DRAWN AEROBIC AND ANAEROBIC Blood Culture results may not be optimal due to an inadequate volume of blood received in culture bottles Performed at [facility], [address] [place], [place] [address]    Culture  Setup Time   Final    Organism ID to follow [place] TO, READ BACK BY AND VERIFIED WITH: ALEX CHAPPELL 03/04/22 @ 1420 BY SB Performed at [facility], [address], [place], [place] [address]    Culture   Final    Lonell Grandchild POSITIVE COCCI TOO YOUNG TO READ Performed at [facility], [place] [address] [place], [place] [address]    Report Status PENDING  Incomplete  Blood Culture ID Panel (Reflexed)     Status: Abnormal   Collection Time: 03/03/22  9:11 PM  Result Value Ref Range Status   Enterococcus faecalis NOT DETECTED NOT DETECTED Final   Enterococcus Faecium NOT DETECTED NOT DETECTED Final   Listeria monocytogenes NOT DETECTED NOT DETECTED Final   Staphylococcus species DETECTED (A) NOT DETECTED Final    Comment: CRITICAL RESULT CALLED TO, READ BACK BY AND VERIFIED WITH: ALEX CHAPPELL 03/04/22 @ 1420 BY SB    Staphylococcus aureus (BCID) NOT DETECTED NOT DETECTED Final   Staphylococcus epidermidis NOT DETECTED NOT  DETECTED Final   Staphylococcus lugdunensis NOT DETECTED NOT DETECTED Final   Streptococcus species NOT DETECTED NOT DETECTED Final   Streptococcus agalactiae NOT DETECTED NOT DETECTED Final   Streptococcus pneumoniae NOT DETECTED NOT DETECTED Final   Streptococcus pyogenes NOT DETECTED NOT DETECTED Final   A.calcoaceticus-baumannii NOT DETECTED NOT DETECTED Final    Bacteroides fragilis NOT DETECTED NOT DETECTED Final   Enterobacterales NOT DETECTED NOT DETECTED Final   Enterobacter cloacae complex NOT DETECTED NOT DETECTED Final   Escherichia coli NOT DETECTED NOT DETECTED Final   Klebsiella aerogenes NOT DETECTED NOT DETECTED Final   Klebsiella oxytoca NOT DETECTED NOT DETECTED Final   Klebsiella pneumoniae NOT DETECTED NOT DETECTED Final   Proteus species NOT DETECTED NOT DETECTED Final   Salmonella species NOT DETECTED NOT DETECTED Final   Serratia marcescens NOT DETECTED NOT DETECTED Final   Haemophilus influenzae NOT DETECTED NOT DETECTED Final   Neisseria meningitidis NOT DETECTED NOT DETECTED Final   Pseudomonas aeruginosa NOT DETECTED NOT DETECTED Final   Stenotrophomonas maltophilia NOT DETECTED NOT DETECTED Final   Candida albicans NOT DETECTED NOT DETECTED Final   Candida auris NOT DETECTED NOT DETECTED Final   Candida glabrata NOT DETECTED NOT DETECTED Final   Candida krusei NOT DETECTED NOT DETECTED Final   Candida parapsilosis NOT DETECTED NOT DETECTED Final   Candida tropicalis NOT DETECTED NOT DETECTED Final   Cryptococcus neoformans/gattii NOT DETECTED NOT DETECTED Final    Comment: Performed at [facility], [address] [place], [place] [address]  Culture, blood (Routine X 2) w Reflex to ID Panel     Status: None (Preliminary result)   Collection Time: 03/04/22 12:59 AM   Specimen: BLOOD  Result Value Ref Range Status   Specimen Description BLOOD BLOOD LEFT WRIST  Final   Special Requests IN PEDIATRIC BOTTLE Blood Culture adequate volume  Final   Culture   Final    NO GROWTH 1 DAY Performed at [facility], [address] [place], [place] [address]    Report Status PENDING  Incomplete    Procedures and diagnostic studies:  DG Chest Portable 1 View  Result Date: 03/03/2022 CLINICAL DATA:  Shortness of breath EXAM: PORTABLE CHEST 1 VIEW COMPARISON:  Chest x-ray 12/23/2021 FINDINGS: There are  minimal patchy and strandy opacities in the right lower lung. There is no pleural effusion or pneumothorax. The cardiomediastinal silhouette is within normal limits. No acute fractures are seen. IMPRESSION: Minimal patchy and strandy opacities in the right lower lung could reflect atelectasis or infection. Electronically Signed   By: Ronney Asters M.D.   On: 03/03/2022 20:20               LOS: 2 days   Katherine [name]  [facility]   Pager on [email or web address]. If 7PM-7AM, please contact night-coverage at [email or web address]     03/05/2022, 11:48 AM

## 2022-03-05 NOTE — ED Notes (Signed)
ED TO INPATIENT HANDOFF REPORT  ED Nurse Name and Phone #: [phone]  S Name/Age/Gender Katherine [name] 82 y.o. female Room/Bed: ED30A/ED30A  Code Status   Code Status: DNR  Home/SNF/Other Skilled nursing facility Patient oriented to: self Is this baseline? Yes   Triage Complete: Triage complete  Chief Complaint CAP (community acquired pneumonia) [J18.9]  Triage Note Pt from [facility] r/t O2 saturation "in 50's." Baseline O2 is 2L [place], facility increased to 5L and was still low.  Breathing Tx increased O2 to "80's" Pt currently on 4L  sat is 94%   Allergies Allergies  Allergen Reactions   Bee Venom Swelling and Other (See Comments)    "Allergic," per MAR   Enalapril Maleate Swelling and Other (See Comments)    "Allergic," per MAR    Level of Care/Admitting Diagnosis ED Disposition     ED Disposition  Admit   Condition  --   [place] [place] [100120]  Level of Care: Progressive [102]  Admit to Progressive based on following criteria: RESPIRATORY PROBLEMS hypoxemic/hypercapnic respiratory failure that is responsive to NIPPV (BiPAP) or High Flow Nasal Cannula (6-80 lpm). Frequent assessment/intervention, no > Q2 hrs < Q4 hrs, to maintain oxygenation and pulmonary hygiene.  Covid Evaluation: Asymptomatic - no recent exposure (last 10 days) testing not required  Diagnosis: CAP (community acquired pneumonia) [546503]  Admitting Physician: Athena Masse [[id number]]  Attending Physician: Athena Masse [[id number]]  Certification:: I certify this patient will need inpatient services for at least 2 midnights  Estimated Length of Stay: 3          B Medical/Surgery History Past Medical History:  Diagnosis Date   Acute encephalopathy 02/11/2019   Acute on chronic anemia 01/03/2019   Acute on chronic congestive heart failure ([place])    Acute respiratory failure with hypoxia and hypercapnia ([place]) 04/19/2021   Angioedema     Anxiety and depression    Aphasia 06/28/2017   Cerebral atrophy ([place]) 06/19/2021   Chest pain 07/15/2018   CHF (congestive heart failure) (HCC)    Chronic constipation    Chronic kidney disease, stage 4 (severe) ([place]) 06/19/2021   COPD (chronic obstructive pulmonary disease) ([place])    COPD exacerbation ([place]) 11/30/2017   COPD with acute exacerbation ([place]) 02/24/2018   COVID-19 12/21/2018   COVID-19 virus infection 12/21/2018   Delirium    Diabetes mellitus type 2, uncomplicated ([place]) 17/49/4496   Diabetes mellitus without complication (HCC)    Elevated rheumatoid factor 09/05/2017   Gallstones    GERD (gastroesophageal reflux disease)    Hematemesis 04/28/2018   Hemoptysis 12/20/2018   Hypertension    Influenza A 04/13/2018   Left knee pain 06/12/2016   Left ventricular hypertrophy    Localized edema 05/10/2018   Osteoarthritis    Pain in finger of left hand 09/12/2016   Pain in soft tissues of limb 05/20/2013   Formatting of this note might be different from the original. Last Assessment & Plan:  Handled by [facility] doctor; wearing boot   Palliative care encounter 05/10/2018   Possible Seizures ([place]) 10/08/2017   Pressure injury of skin 10/29/2017   Prurigo nodularis    Rheumatoid arthritis ([place])    on plaquenil   Right hand pain 03/21/2017   Seizures ([place])    Sepsis ([place]) 10/24/2017   Sepsis due to gram-negative UTI ([place]) 06/19/2019   Stroke ([place])    Stroke ([place]) 06/19/2019   Stroke-like episode ([place]) s/p IV tpa 10/04/2017  Tobacco abuse    Urinary tract infection 11/22/2017   UTI (urinary tract infection) 08/21/2018   Vitamin D deficiency disease    Past Surgical History:  Procedure Laterality Date   CHOLECYSTECTOMY     POLYPECTOMY  11/2011   vocal cord   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   Procedure: TOTAL KNEE ARTHROPLASTY;  Surgeon: Hessie Knows, MD;  Location: [facility] ORS;  Service: Orthopedics;  Laterality: Right;     A IV  Location/Drains/Wounds Patient Lines/Drains/Airways Status     Active Line/Drains/Airways     Name Placement date Placement time Site Days   Peripheral IV 03/03/22 20 G Anterior;Left Forearm 03/03/22  2122  Forearm  2   External Urinary Catheter 03/03/22  2010  --  2            Intake/Output Last 24 hours  Intake/Output Summary (Last 24 hours) at 03/05/2022 1427 Last data filed at 03/04/2022 1713 Gross per 24 hour  Intake --  Output 600 ml  Net -600 ml    Labs/Imaging Results for orders placed or performed during the hospital encounter of 03/03/22 (from the past 48 hour(s))  Basic metabolic panel     Status: Abnormal   Collection Time: 03/03/22  7:57 PM  Result Value Ref Range   Sodium 139 135 - 145 mmol/L   Potassium 4.7 3.5 - 5.1 mmol/L   Chloride 95 (L) 98 - 111 mmol/L   CO2 35 (H) 22 - 32 mmol/L   Glucose, Bld 116 (H) 70 - 99 mg/dL    Comment: Glucose reference range applies only to samples taken after fasting for at least 8 hours.   BUN 49 (H) 8 - 23 mg/dL   Creatinine, Ser 2.75 (H) 0.44 - 1.00 mg/dL   Calcium 9.2 8.9 - 10.3 mg/dL   GFR, Estimated 17 (L) >60 mL/min    Comment: (NOTE) Calculated using the CKD-EPI Creatinine Equation (2021)    Anion gap 9 5 - 15    Comment: Performed at [facility], [address] [place], [place] [address]  Brain natriuretic peptide     Status: None   Collection Time: 03/03/22  7:57 PM  Result Value Ref Range   B Natriuretic Peptide 89.6 0.0 - 100.0 pg/mL    Comment: Performed at [facility], [place], [place] [address]  Troponin I (High Sensitivity)     Status: Abnormal   Collection Time: 03/03/22  7:57 PM  Result Value Ref Range   Troponin I (High Sensitivity) 23 (H) <18 ng/L    Comment: (NOTE) Elevated high sensitivity troponin I (hsTnI) values and significant  changes across serial measurements may suggest ACS but many other  chronic and acute conditions are known to elevate hsTnI  results.  Refer to the "Links" section for chest pain algorithms and additional  guidance. Performed at [facility], [address] [place], [place] [address]   CBC with Differential     Status: Abnormal   Collection Time: 03/03/22  7:57 PM  Result Value Ref Range   WBC 6.1 4.0 - 10.5 K/uL   RBC 3.70 (L) 3.87 - 5.11 MIL/uL   Hemoglobin 10.2 (L) 12.0 - 15.0 g/dL   HCT 34.6 (L) 36.0 - 46.0 %   MCV 93.5 80.0 - 100.0 fL   MCH 27.6 26.0 - 34.0 pg   MCHC 29.5 (L) 30.0 - 36.0 g/dL   RDW 12.9 11.5 - 15.5 %   Platelets 247 150 - 400 K/uL  nRBC 0.3 (H) 0.0 - 0.2 %   Neutrophils Relative % 53 %   Neutro Abs 3.3 1.7 - 7.7 K/uL   Lymphocytes Relative 36 %   Lymphs Abs 2.2 0.7 - 4.0 K/uL   Monocytes Relative 8 %   Monocytes Absolute 0.5 0.1 - 1.0 K/uL   Eosinophils Relative 1 %   Eosinophils Absolute 0.1 0.0 - 0.5 K/uL   Basophils Relative 1 %   Basophils Absolute 0.0 0.0 - 0.1 K/uL   Immature Granulocytes 1 %   Abs Immature Granulocytes 0.06 0.00 - 0.07 K/uL    Comment: Performed at [facility], [address], [place], [place] [address]  Resp panel by RT-PCR (RSV, Flu A&B, Covid) Anterior Nasal Swab     Status: None   Collection Time: 03/03/22  7:57 PM   Specimen: Anterior Nasal Swab  Result Value Ref Range   SARS Coronavirus 2 by RT PCR NEGATIVE NEGATIVE    Comment: (NOTE) SARS-CoV-2 target nucleic acids are NOT DETECTED.  The SARS-CoV-2 RNA is generally detectable in upper respiratory specimens during the acute phase of infection. The lowest concentration of SARS-CoV-2 viral copies this assay can detect is 138 copies/mL. A negative result does not preclude SARS-Cov-2 infection and should not be used as the sole basis for treatment or other patient management decisions. A negative result may occur with  improper specimen collection/handling, submission of specimen other than nasopharyngeal swab, presence of viral mutation(s) within the areas targeted by  this assay, and inadequate number of viral copies(<138 copies/mL). A negative result must be combined with clinical observations, patient history, and epidemiological information. The expected result is Negative.  Fact Sheet for Patients:  EntrepreneurPulse.com.au  Fact Sheet for Healthcare Providers:  IncredibleEmployment.be  This test is no t yet approved or cleared by the [place] FDA and  has been authorized for detection and/or diagnosis of SARS-CoV-2 by FDA under an Emergency Use Authorization (EUA). This EUA will remain  in effect (meaning this test can be used) for the duration of the COVID-19 declaration under Section 564(b)(1) of the Act, 21 U.S.C.section 360bbb-3(b)(1), unless the authorization is terminated  or revoked sooner.       Influenza A by PCR NEGATIVE NEGATIVE   Influenza B by PCR NEGATIVE NEGATIVE    Comment: (NOTE) The Xpert Xpress SARS-CoV-2/FLU/RSV plus assay is intended as an aid in the diagnosis of influenza from Nasopharyngeal swab specimens and should not be used as a sole basis for treatment. Nasal washings and aspirates are unacceptable for Xpert Xpress SARS-CoV-2/FLU/RSV testing.  Fact Sheet for Patients: EntrepreneurPulse.com.au  Fact Sheet for Healthcare Providers: IncredibleEmployment.be  This test is not yet approved or cleared by the [place] FDA and has been authorized for detection and/or diagnosis of SARS-CoV-2 by FDA under an Emergency Use Authorization (EUA). This EUA will remain in effect (meaning this test can be used) for the duration of the COVID-19 declaration under Section 564(b)(1) of the Act, 21 U.S.C. section 360bbb-3(b)(1), unless the authorization is terminated or revoked.     Resp Syncytial Virus by PCR NEGATIVE NEGATIVE    Comment: (NOTE) Fact Sheet for Patients: EntrepreneurPulse.com.au  Fact Sheet for Healthcare  Providers: IncredibleEmployment.be  This test is not yet approved or cleared by the [place] FDA and has been authorized for detection and/or diagnosis of SARS-CoV-2 by FDA under an Emergency Use Authorization (EUA). This EUA will remain in effect (meaning this test can be used) for the duration of the COVID-19 declaration under  Section 564(b)(1) of the Act, 21 U.S.C. section 360bbb-3(b)(1), unless the authorization is terminated or revoked.  Performed at [facility], [address] [place], [place] [address]   Culture, blood (single)     Status: None (Preliminary result)   Collection Time: 03/03/22  9:11 PM   Specimen: BLOOD  Result Value Ref Range   Specimen Description      BLOOD BLOOD LEFT FOREARM Performed at [facility], [address], [place], [place] [address]    Special Requests      BOTTLES DRAWN AEROBIC AND ANAEROBIC Blood Culture results may not be optimal due to an inadequate volume of blood received in culture bottles Performed at [facility], [address] [place], [place] [address]    Culture  Setup Time      Organism ID to follow [place] TO, READ BACK BY AND VERIFIED WITH: ALEX CHAPPELL 03/04/22 @ 1420 BY SB Performed at [facility], [address], [place], [place] [address]    Culture      GRAM POSITIVE COCCI TOO YOUNG TO READ Performed at [facility], [address] [place], [place] [address]    Report Status PENDING   Blood Culture ID Panel (Reflexed)     Status: Abnormal   Collection Time: 03/03/22  9:11 PM  Result Value Ref Range   Enterococcus faecalis NOT DETECTED NOT DETECTED   Enterococcus Faecium NOT DETECTED NOT DETECTED   Listeria monocytogenes NOT DETECTED NOT DETECTED   Staphylococcus species DETECTED (A) NOT DETECTED    Comment: CRITICAL RESULT CALLED TO, READ BACK BY AND VERIFIED WITH: ALEX CHAPPELL  03/04/22 @ 1420 BY SB    Staphylococcus aureus (BCID) NOT DETECTED NOT DETECTED   Staphylococcus epidermidis NOT DETECTED NOT DETECTED   Staphylococcus lugdunensis NOT DETECTED NOT DETECTED   Streptococcus species NOT DETECTED NOT DETECTED   Streptococcus agalactiae NOT DETECTED NOT DETECTED   Streptococcus pneumoniae NOT DETECTED NOT DETECTED   Streptococcus pyogenes NOT DETECTED NOT DETECTED   A.calcoaceticus-baumannii NOT DETECTED NOT DETECTED   Bacteroides fragilis NOT DETECTED NOT DETECTED   Enterobacterales NOT DETECTED NOT DETECTED   Enterobacter cloacae complex NOT DETECTED NOT DETECTED   Escherichia coli NOT DETECTED NOT DETECTED   Klebsiella aerogenes NOT DETECTED NOT DETECTED   Klebsiella oxytoca NOT DETECTED NOT DETECTED   Klebsiella pneumoniae NOT DETECTED NOT DETECTED   Proteus species NOT DETECTED NOT DETECTED   Salmonella species NOT DETECTED NOT DETECTED   Serratia marcescens NOT DETECTED NOT DETECTED   Haemophilus influenzae NOT DETECTED NOT DETECTED   Neisseria meningitidis NOT DETECTED NOT DETECTED   Pseudomonas aeruginosa NOT DETECTED NOT DETECTED   Stenotrophomonas maltophilia NOT DETECTED NOT DETECTED   Candida albicans NOT DETECTED NOT DETECTED   Candida auris NOT DETECTED NOT DETECTED   Candida glabrata NOT DETECTED NOT DETECTED   Candida krusei NOT DETECTED NOT DETECTED   Candida parapsilosis NOT DETECTED NOT DETECTED   Candida tropicalis NOT DETECTED NOT DETECTED   Cryptococcus neoformans/gattii NOT DETECTED NOT DETECTED    Comment: Performed at [facility], [address] [place], [place] [address]  Troponin I (High Sensitivity)     Status: Abnormal   Collection Time: 03/03/22 10:02 PM  Result Value Ref Range   Troponin I (High Sensitivity) 19 (H) <18 ng/L    Comment: (NOTE) Elevated high sensitivity troponin I (hsTnI) values and significant  changes across serial measurements may suggest ACS but  many other  chronic and acute conditions  are known to elevate hsTnI results.  Refer to the "Links" section for chest pain algorithms and additional  guidance. Performed at [facility], [address] [place], [place] [address]   Procalcitonin - Baseline     Status: None   Collection Time: 03/03/22 10:02 PM  Result Value Ref Range   Procalcitonin <0.10 ng/mL    Comment:        Interpretation: PCT (Procalcitonin) <= 0.5 ng/mL: Systemic infection (sepsis) is not likely. Local bacterial infection is possible. (NOTE)       Sepsis PCT Algorithm           Lower Respiratory Tract                                      Infection PCT Algorithm    ----------------------------     ----------------------------         PCT < 0.25 ng/mL                PCT < 0.10 ng/mL          Strongly encourage             Strongly discourage   discontinuation of antibiotics    initiation of antibiotics    ----------------------------     -----------------------------       PCT 0.25 - 0.50 ng/mL            PCT 0.10 - 0.25 ng/mL               OR       >80% decrease in PCT            Discourage initiation of                                            antibiotics      Encourage discontinuation           of antibiotics    ----------------------------     -----------------------------         PCT >= 0.50 ng/mL              PCT 0.26 - 0.50 ng/mL               AND        <80% decrease in PCT             Encourage initiation of                                             antibiotics       Encourage continuation           of antibiotics    ----------------------------     -----------------------------        PCT >= 0.50 ng/mL                  PCT > 0.50 ng/mL               AND         increase in PCT  Strongly encourage                                      initiation of antibiotics    Strongly encourage escalation           of antibiotics                                     -----------------------------                                            PCT <= 0.25 ng/mL                                                 OR                                        > 80% decrease in PCT                                      Discontinue / Do not initiate                                             antibiotics  Performed at [facility], [address] [place], [place] [address]   CBG monitoring, ED     Status: None   Collection Time: 03/04/22 12:08 AM  Result Value Ref Range   Glucose-Capillary 98 70 - 99 mg/dL    Comment: Glucose reference range applies only to samples taken after fasting for at least 8 hours.  Lactic acid, plasma     Status: None   Collection Time: 03/04/22 12:59 AM  Result Value Ref Range   Lactic Acid, Venous 1.2 0.5 - 1.9 mmol/L    Comment: Performed at [facility], [address] [place], [place] [address]  Culture, blood (Routine X 2) w Reflex to ID Panel     Status: None (Preliminary result)   Collection Time: 03/04/22 12:59 AM   Specimen: BLOOD  Result Value Ref Range   Specimen Description BLOOD BLOOD LEFT WRIST    Special Requests IN PEDIATRIC BOTTLE Blood Culture adequate volume    Culture      NO GROWTH 1 DAY Performed at [facility], [address], [place], [place] [address]    Report Status PENDING   CBG monitoring, ED     Status: None   Collection Time: 03/04/22  7:26 AM  Result Value Ref Range   Glucose-Capillary 89 70 - 99 mg/dL    Comment: Glucose reference range applies only to samples taken after fasting for at least 8 hours.  Lactic acid, plasma     Status: None   Collection Time: 03/04/22  7:54 AM  Result Value Ref Range   Lactic Acid, Venous  0.8 0.5 - 1.9 mmol/L    Comment: Performed at [facility], [address] [place], [place] [address]  Procalcitonin     Status: None   Collection Time: 03/04/22  7:54 AM  Result Value Ref Range   Procalcitonin <0.10 ng/mL    Comment:        Interpretation: PCT (Procalcitonin) <= 0.5  ng/mL: Systemic infection (sepsis) is not likely. Local bacterial infection is possible. (NOTE)       Sepsis PCT Algorithm           Lower Respiratory Tract                                      Infection PCT Algorithm    ----------------------------     ----------------------------         PCT < 0.25 ng/mL                PCT < 0.10 ng/mL          Strongly encourage             Strongly discourage   discontinuation of antibiotics    initiation of antibiotics    ----------------------------     -----------------------------       PCT 0.25 - 0.50 ng/mL            PCT 0.10 - 0.25 ng/mL               OR       >80% decrease in PCT            Discourage initiation of                                            antibiotics      Encourage discontinuation           of antibiotics    ----------------------------     -----------------------------         PCT >= 0.50 ng/mL              PCT 0.26 - 0.50 ng/mL               AND        <80% decrease in PCT             Encourage initiation of                                             antibiotics       Encourage continuation           of antibiotics    ----------------------------     -----------------------------        PCT >= 0.50 ng/mL                  PCT > 0.50 ng/mL               AND         increase in PCT                  Strongly encourage  initiation of antibiotics    Strongly encourage escalation           of antibiotics                                     -----------------------------                                           PCT <= 0.25 ng/mL                                                 OR                                        > 80% decrease in PCT                                      Discontinue / Do not initiate                                             antibiotics  Performed at [facility], [address] [place], [place] [address]   Protime-INR     Status: None    Collection Time: 03/04/22  7:54 AM  Result Value Ref Range   Prothrombin Time 13.5 11.4 - 15.2 seconds   INR 1.0 0.8 - 1.2    Comment: (NOTE) INR goal varies based on device and disease states. Performed at [facility], [address] [place], [place] [address]   Cortisol-am, blood     Status: None   Collection Time: 03/04/22  7:54 AM  Result Value Ref Range   Cortisol - AM 8.8 6.7 - 22.6 ug/dL    Comment: Performed at [facility] [address] [place], [place] [address]  Basic metabolic panel     Status: Abnormal   Collection Time: 03/04/22  7:54 AM  Result Value Ref Range   Sodium 139 135 - 145 mmol/L   Potassium 5.3 (H) 3.5 - 5.1 mmol/L    Comment: HEMOLYSIS AT THIS LEVEL MAY AFFECT RESULT   Chloride 99 98 - 111 mmol/L   CO2 35 (H) 22 - 32 mmol/L   Glucose, Bld 92 70 - 99 mg/dL    Comment: Glucose reference range applies only to samples taken after fasting for at least 8 hours.   BUN 44 (H) 8 - 23 mg/dL   Creatinine, Ser 2.44 (H) 0.44 - 1.00 mg/dL   Calcium 8.5 (L) 8.9 - 10.3 mg/dL   GFR, Estimated 20 (L) >60 mL/min    Comment: (NOTE) Calculated using the CKD-EPI Creatinine Equation (2021)    Anion gap 5 5 - 15    Comment: Performed at [facility], [address] [place], [place] [address]  CBC     Status: Abnormal   Collection Time: 03/04/22  7:54 AM  Result Value  Ref Range   WBC 4.9 4.0 - 10.5 K/uL   RBC 3.30 (L) 3.87 - 5.11 MIL/uL   Hemoglobin 9.2 (L) 12.0 - 15.0 g/dL   HCT 31.2 (L) 36.0 - 46.0 %   MCV 94.5 80.0 - 100.0 fL   MCH 27.9 26.0 - 34.0 pg   MCHC 29.5 (L) 30.0 - 36.0 g/dL   RDW 13.2 11.5 - 15.5 %   Platelets 189 150 - 400 K/uL   nRBC 0.0 0.0 - 0.2 %    Comment: Performed at [facility], [address] [place], [place] [address]  CBG monitoring, ED     Status: None   Collection Time: 03/04/22 11:17 AM  Result Value Ref Range   Glucose-Capillary 87 70 - 99 mg/dL    Comment: Glucose reference range applies  only to samples taken after fasting for at least 8 hours.   Comment 1 Notify RN   CBG monitoring, ED     Status: None   Collection Time: 03/04/22  4:53 PM  Result Value Ref Range   Glucose-Capillary 86 70 - 99 mg/dL    Comment: Glucose reference range applies only to samples taken after fasting for at least 8 hours.  CBG monitoring, ED     Status: None   Collection Time: 03/04/22 10:15 PM  Result Value Ref Range   Glucose-Capillary 79 70 - 99 mg/dL    Comment: Glucose reference range applies only to samples taken after fasting for at least 8 hours.  Procalcitonin     Status: None   Collection Time: 03/05/22  4:23 AM  Result Value Ref Range   Procalcitonin <0.10 ng/mL    Comment:        Interpretation: PCT (Procalcitonin) <= 0.5 ng/mL: Systemic infection (sepsis) is not likely. Local bacterial infection is possible. (NOTE)       Sepsis PCT Algorithm           Lower Respiratory Tract                                      Infection PCT Algorithm    ----------------------------     ----------------------------         PCT < 0.25 ng/mL                PCT < 0.10 ng/mL          Strongly encourage             Strongly discourage   discontinuation of antibiotics    initiation of antibiotics    ----------------------------     -----------------------------       PCT 0.25 - 0.50 ng/mL            PCT 0.10 - 0.25 ng/mL               OR       >80% decrease in PCT            Discourage initiation of                                            antibiotics      Encourage discontinuation           of antibiotics    ----------------------------     -----------------------------  PCT >= 0.50 ng/mL              PCT 0.26 - 0.50 ng/mL               AND        <80% decrease in PCT             Encourage initiation of                                             antibiotics       Encourage continuation           of antibiotics    ----------------------------     -----------------------------         PCT >= 0.50 ng/mL                  PCT > 0.50 ng/mL               AND         increase in PCT                  Strongly encourage                                      initiation of antibiotics    Strongly encourage escalation           of antibiotics                                     -----------------------------                                           PCT <= 0.25 ng/mL                                                 OR                                        > 80% decrease in PCT                                      Discontinue / Do not initiate                                             antibiotics  Performed at [facility], [address] [place], [place] [address]   Basic metabolic panel     Status: Abnormal   Collection Time: 03/05/22  4:23 AM  Result Value Ref Range   Sodium 143 135 - 145 mmol/L   Potassium 4.4 3.5 - 5.1 mmol/L   Chloride 105 98 - 111 mmol/L   CO2  35 (H) 22 - 32 mmol/L   Glucose, Bld 87 70 - 99 mg/dL    Comment: Glucose reference range applies only to samples taken after fasting for at least 8 hours.   BUN 36 (H) 8 - 23 mg/dL   Creatinine, Ser 1.96 (H) 0.44 - 1.00 mg/dL   Calcium 8.6 (L) 8.9 - 10.3 mg/dL   GFR, Estimated 25 (L) >60 mL/min    Comment: (NOTE) Calculated using the CKD-EPI Creatinine Equation (2021)    Anion gap 3 (L) 5 - 15    Comment: Performed at [facility], [address] [place], [place] [address]  CBG monitoring, ED     Status: None   Collection Time: 03/05/22  7:46 AM  Result Value Ref Range   Glucose-Capillary 75 70 - 99 mg/dL    Comment: Glucose reference range applies only to samples taken after fasting for at least 8 hours.   Comment 1 Notify RN   CBG monitoring, ED     Status: None   Collection Time: 03/05/22 11:26 AM  Result Value Ref Range   Glucose-Capillary 95 70 - 99 mg/dL    Comment: Glucose reference range applies only to samples taken after fasting for at least 8 hours.   DG Chest  Portable 1 View  Result Date: 03/03/2022 CLINICAL DATA:  Shortness of breath EXAM: PORTABLE CHEST 1 VIEW COMPARISON:  Chest x-ray 12/23/2021 FINDINGS: There are minimal patchy and strandy opacities in the right lower lung. There is no pleural effusion or pneumothorax. The cardiomediastinal silhouette is within normal limits. No acute fractures are seen. IMPRESSION: Minimal patchy and strandy opacities in the right lower lung could reflect atelectasis or infection. Electronically Signed   By: Ronney Asters M.D.   On: 03/03/2022 20:20    Pending Labs Unresulted Labs (From admission, onward)     Start     Ordered   03/10/22 0500  Creatinine, serum  (enoxaparin (LOVENOX)    CrCl < 30 ml/min)  Once,   R       Comments: while on enoxaparin therapy.    03/03/22 2245            Vitals/Pain Today's Vitals   03/05/22 0700 03/05/22 0800 03/05/22 0900 03/05/22 1240  BP: 129/70 132/83 132/73 118/75  Pulse: 75 73 77 73  Resp: 20 (!) 21 (!) 21 (!) 22  Temp:  97.6 F (36.4 C)  97.8 F (36.6 C)  TempSrc:  Oral    SpO2: 100% 100% 98% 99%  Weight:      Height:      PainSc:        Isolation Precautions No active isolations  Medications Medications  hydroxychloroquine (PLAQUENIL) tablet 200 mg (200 mg Oral Not Given 03/04/22 1711)  sertraline (ZOLOFT) tablet 50 mg (50 mg Oral Given 03/05/22 0756)  pantoprazole (PROTONIX) EC tablet 20 mg (20 mg Oral Given 03/05/22 1309)  levETIRAcetam (KEPPRA) tablet 500 mg (500 mg Oral Given 03/05/22 1309)  ipratropium-albuterol (DUONEB) 0.5-2.5 (3) MG/3ML nebulizer solution 3 mL (3 mLs Nebulization Given 03/05/22 1310)  insulin aspart (novoLOG) injection 0-20 Units ( Subcutaneous Not Given 03/05/22 1230)  insulin aspart (novoLOG) injection 0-5 Units ( Subcutaneous Not Given 03/04/22 2222)  enoxaparin (LOVENOX) injection 40 mg (40 mg Subcutaneous Patient Refused/Not Given 03/04/22 2223)  acetaminophen (TYLENOL) tablet 650 mg (has no administration in time range)     Or  acetaminophen (TYLENOL) suppository 650 mg (has no administration in time range)  HYDROcodone-acetaminophen (NORCO/VICODIN) 5-325 MG  per tablet 1-2 tablet (has no administration in time range)  ondansetron (ZOFRAN) tablet 4 mg (has no administration in time range)    Or  ondansetron (ZOFRAN) injection 4 mg (has no administration in time range)  guaiFENesin (MUCINEX) 12 hr tablet 600 mg (600 mg Oral Given 03/05/22 1309)  ipratropium-albuterol (DUONEB) 0.5-2.5 (3) MG/3ML nebulizer solution 3 mL (has no administration in time range)  lactated ringers infusion (0 mLs Intravenous Stopped 03/04/22 0942)  cefTRIAXone (ROCEPHIN) 1 g in sodium chloride 0.9 % 100 mL IVPB (0 g Intravenous Stopped 03/04/22 2256)  fluticasone furoate-vilanterol (BREO ELLIPTA) 100-25 MCG/ACT 1 puff (1 puff Inhalation Not Given 03/04/22 0937)  umeclidinium bromide (INCRUSE ELLIPTA) 62.5 MCG/ACT 1 puff (1 puff Inhalation Not Given 03/04/22 0938)  0.9 %  sodium chloride infusion ( Intravenous New Bag/Given 03/04/22 1232)  azithromycin (ZITHROMAX) tablet 500 mg (has no administration in time range)  alum & mag hydroxide-simeth (MAALOX/MYLANTA) 200-200-20 MG/5ML suspension 30 mL (has no administration in time range)  sodium chloride 0.9 % bolus 500 mL (0 mLs Intravenous Stopped 03/03/22 2228)    Mobility non-ambulatory     Focused Assessments    R Recommendations: See Admitting Provider Note  Report given to:   Additional Notes: please call with any questions

## 2022-03-05 NOTE — ED Notes (Signed)
Advised nurse that patient has ready bed 

## 2022-03-05 NOTE — ED Notes (Signed)
Pt taken off bed pan, pt cleaned, linens changed, new Purewick placed

## 2022-03-06 DIAGNOSIS — J189 Pneumonia, unspecified organism: Secondary | ICD-10-CM | POA: Diagnosis not present

## 2022-03-06 LAB — CULTURE, BLOOD (SINGLE)

## 2022-03-06 LAB — GLUCOSE, CAPILLARY
Glucose-Capillary: 74 mg/dL (ref 70–99)
Glucose-Capillary: 79 mg/dL (ref 70–99)
Glucose-Capillary: 83 mg/dL (ref 70–99)
Glucose-Capillary: 92 mg/dL (ref 70–99)
Glucose-Capillary: 83 mg/dL (ref 70–99)

## 2022-03-06 MED ORDER — LACOSAMIDE 50 MG PO TABS
100.0000 mg | ORAL_TABLET | Freq: Two times a day (BID) | ORAL | Status: DC
  Administered 2022-03-06 – 2022-03-07 (×2): 100 mg via ORAL
  Filled 2022-03-06 (×2): qty 2

## 2022-03-06 MED ORDER — SODIUM CHLORIDE 0.9 % IV SOLN
2.0000 g | INTRAVENOUS | Status: DC
  Filled 2022-03-06: qty 20

## 2022-03-06 MED ORDER — AMOXICILLIN-POT CLAVULANATE 250-125 MG PO TABS
1.0000 | ORAL_TABLET | Freq: Two times a day (BID) | ORAL | Status: DC
  Administered 2022-03-06 – 2022-03-07 (×2): 1 via ORAL
  Filled 2022-03-06 (×2): qty 1

## 2022-03-06 MED ORDER — IPRATROPIUM-ALBUTEROL 0.5-2.5 (3) MG/3ML IN SOLN
3.0000 mL | Freq: Two times a day (BID) | RESPIRATORY_TRACT | Status: DC
  Administered 2022-03-07: 3 mL via RESPIRATORY_TRACT
  Filled 2022-03-06: qty 3

## 2022-03-06 NOTE — Progress Notes (Signed)
Rn helped hold pt.  In report now.  Will wait allow pt to calm down.. Pt anxious.

## 2022-03-06 NOTE — Progress Notes (Addendum)
Progress Note    Katherine [name]  BSW:[id number] DOB: Jun 30, 1940  DOA: 03/03/2022 PCP: [facility]      Brief Narrative:    Medical records reviewed and are as summarized below:  Katherine [name] is a 82 y.o. female  with medical history significant for Class III obesity, cognitive dysfunction, probable dementia at baseline, history of stroke, CKD 4, seizure disorder, rheumatoid arthritis, anemia of chronic kidney disease, diastolic CHF, COPD on home O2 at 2 L, DM, cerebral atrophy, hospitalized in November 2023 with status epilepticus requiring intubation for airway protection.  She presented to the hospital from the nursing home because of low oxygen saturation in the 50s.  Reportedly oxygen saturation only improved to the 80s after she was placed on 5 L/min oxygen.  She was hypotensive in the emergency room with BP of 81/60.  She was admitted to the hospital for community-acquired pneumonia complicated by acute hypoxic respiratory failure.     Assessment/Plan:   Principal Problem:   CAP (community acquired pneumonia) Active Problems:   Acute on chronic respiratory failure with hypoxia (HCC)   Hypotension   Acute renal failure superimposed on stage 4 chronic kidney disease (HCC)   Chronic diastolic heart failure (HCC)   Seizure disorder (HCC)   COPD (chronic obstructive pulmonary disease) (HCC)   Obesity, Class III, BMI 40-49.9 (morbid obesity) ([place])   Anxiety   Coronary artery disease   OSA (obstructive sleep apnea)   Diabetes mellitus type 2, controlled, with complications ([place])   Body mass index is 40.03 kg/m.  (Morbid obesity)  Community-acquired pneumonia: 1 out of 3 blood culture bottles shows Staph capitis which is likely a contaminant.  Patient lost peripheral IV access and refused to have another peripheral IV line.  Change IV ceftriaxone to Augmentin.  Continue oral azithromycin.  Acute on chronic hypoxic respiratory failure, chronic hypercapnic  respiratory failure: Attempt ABG tomorrow morning. Chart review shows patient has chronic hypoxic and hypercapnic respiratory failure.  Last ABG on the chart was on 12/17/2021.  ABG showed pH of 7.385, pCO2 55.8.  Serum bicarbonate is 35 on 03/05/2022. Continue 2 L/min oxygen via nasal cannula.  Follow-up with social worker to assist with obtaining trilogy machine  Hypotension: Improved.  AKI on CKD stage IV: Creatinine is better.  Repeat BMP tomorrow.   Other comorbidities include seizure disorder (on Keppra), chronic diastolic CHF, COPD, type II DM, OSA on CPAP at night, CAD, anxiety, probable dementia at baseline, history of stroke    Diet Order             Diet heart healthy/carb modified Room service appropriate? Yes; Fluid consistency: Thin  Diet effective now                            Consultants: None  Procedures: None    Medications:    amoxicillin-clavulanate  1 tablet Oral Q12H   azithromycin  500 mg Oral Daily   enoxaparin (LOVENOX) injection  40 mg Subcutaneous Q24H   fluticasone furoate-vilanterol  1 puff Inhalation Daily   guaiFENesin  600 mg Oral BID   hydroxychloroquine  200 mg Oral QAC supper   insulin aspart  0-20 Units Subcutaneous TID WC   insulin aspart  0-5 Units Subcutaneous QHS   [START ON 03/07/2022] ipratropium-albuterol  3 mL Nebulization BID   lacosamide  100 mg Oral BID   levETIRAcetam  500 mg Oral Q12H   pantoprazole  20  mg Oral QAC breakfast   sertraline  50 mg Oral q AM   umeclidinium bromide  1 puff Inhalation Daily   Continuous Infusions:     Anti-infectives (From admission, onward)    Start     Dose/Rate Route Frequency Ordered Stop   03/06/22 2200  cefTRIAXone (ROCEPHIN) 2 g in sodium chloride 0.9 % 100 mL IVPB  Status:  Discontinued        2 g 200 mL/hr over 30 Minutes Intravenous Every 24 hours 03/06/22 1445 03/06/22 1653   03/06/22 1745  amoxicillin-clavulanate (AUGMENTIN) 250-125 MG per tablet 1 tablet         1 tablet Oral Every 12 hours 03/06/22 1653     03/05/22 1800  azithromycin (ZITHROMAX) tablet 500 mg        500 mg Oral Daily 03/05/22 0742 03/08/22 1759   03/04/22 2100  cefTRIAXone (ROCEPHIN) 2 g in sodium chloride 0.9 % 100 mL IVPB  Status:  Discontinued        2 g 200 mL/hr over 30 Minutes Intravenous Every 24 hours 03/03/22 2245 03/04/22 0243   03/04/22 2100  azithromycin (ZITHROMAX) 500 mg in sodium chloride 0.9 % 250 mL IVPB  Status:  Discontinued        500 mg 250 mL/hr over 60 Minutes Intravenous Every 24 hours 03/03/22 2245 03/05/22 0742   03/04/22 2100  cefTRIAXone (ROCEPHIN) 1 g in sodium chloride 0.9 % 100 mL IVPB  Status:  Discontinued        1 g 200 mL/hr over 30 Minutes Intravenous Every 24 hours 03/04/22 0224 03/06/22 1445   03/04/22 1700  hydroxychloroquine (PLAQUENIL) tablet 200 mg        200 mg Oral Daily before supper 03/03/22 2245     03/04/22 0230  azithromycin (ZITHROMAX) 500 mg in sodium chloride 0.9 % 250 mL IVPB  Status:  Discontinued        500 mg 250 mL/hr over 60 Minutes Intravenous Every 24 hours 03/04/22 0224 03/04/22 0242   03/03/22 2045  cefTRIAXone (ROCEPHIN) 2 g in sodium chloride 0.9 % 100 mL IVPB  Status:  Discontinued        2 g 200 mL/hr over 30 Minutes Intravenous Every 24 hours 03/03/22 2032 03/03/22 2250   03/03/22 2045  azithromycin (ZITHROMAX) 500 mg in sodium chloride 0.9 % 250 mL IVPB  Status:  Discontinued        500 mg 250 mL/hr over 60 Minutes Intravenous Every 24 hours 03/03/22 2032 03/03/22 2250              Family Communication/Anticipated D/C date and plan/Code Status   DVT prophylaxis: enoxaparin (LOVENOX) injection 40 mg Start: 03/03/22 2300     Code Status: DNR  Family Communication: None Disposition Plan: Plan to discharge to SNF tomorrow   Status is: Inpatient Remains inpatient appropriate because: pneumonia       Subjective:   Interval events noted.  She is confused and cannot provide an adequate  history.   Objective:    Vitals:   03/06/22 0010 03/06/22 0539 03/06/22 0745 03/06/22 0751  BP: 139/69 (!) 149/83  (!) 158/90  Pulse: 71 81  78  Resp: 18 20  (!) 21  Temp: 98.6 F (37 C) 97.7 F (36.5 C)  98 F (36.7 C)  TempSrc:      SpO2: 99% 100% 100% 97%  Weight:      Height:       No data found.   Intake/Output  Summary (Last 24 hours) at 03/06/2022 1659 Last data filed at 03/06/2022 0516 Gross per 24 hour  Intake 280 ml  Output 200 ml  Net 80 ml   Filed Weights   03/03/22 1937  Weight: 102.5 kg    Exam:  GEN: NAD SKIN: Warm and dry EYES: EOMI ENT: MMM CV: RRR PULM: No wheezing or rales, mild rhonchi at the lung bases ABD: soft, ND, NT, +BS CNS: AAO x 1 (person), confused, non focal EXT: No edema or tenderness       Data Reviewed:   I have personally reviewed following labs and imaging studies:  Labs: Labs show the following:   Basic Metabolic Panel: Recent Labs  Lab 03/03/22 1957 03/04/22 0754 03/05/22 0423  NA 139 139 143  K 4.7 5.3* 4.4  CL 95* 99 105  CO2 35* 35* 35*  GLUCOSE 116* 92 87  BUN 49* 44* 36*  CREATININE 2.75* 2.44* 1.96*  CALCIUM 9.2 8.5* 8.6*   GFR Estimated Creatinine Clearance: 26.2 mL/min (A) (by C-G formula based on SCr of 1.96 mg/dL (H)). Liver Function Tests: No results for input(s): "AST", "ALT", "ALKPHOS", "BILITOT", "PROT", "ALBUMIN" in the last 168 hours. No results for input(s): "LIPASE", "AMYLASE" in the last 168 hours. No results for input(s): "AMMONIA" in the last 168 hours. Coagulation profile Recent Labs  Lab 03/04/22 0754  INR 1.0    CBC: Recent Labs  Lab 03/03/22 1957 03/04/22 0754  WBC 6.1 4.9  NEUTROABS 3.3  --   HGB 10.2* 9.2*  HCT 34.6* 31.2*  MCV 93.5 94.5  PLT 247 189   Cardiac Enzymes: No results for input(s): "CKTOTAL", "CKMB", "CKMBINDEX", "TROPONINI" in the last 168 hours. BNP (last 3 results) No results for input(s): "PROBNP" in the last 8760 hours. CBG: Recent Labs   Lab 03/05/22 2209 03/06/22 0102 03/06/22 0810 03/06/22 1222 03/06/22 1609  GLUCAP 77 74 79 83 92   D-Dimer: No results for input(s): "DDIMER" in the last 72 hours. Hgb A1c: No results for input(s): "HGBA1C" in the last 72 hours. Lipid Profile: No results for input(s): "CHOL", "HDL", "LDLCALC", "TRIG", "CHOLHDL", "LDLDIRECT" in the last 72 hours. Thyroid function studies: No results for input(s): "TSH", "T4TOTAL", "T3FREE", "THYROIDAB" in the last 72 hours.  Invalid input(s): "FREET3" Anemia work up: No results for input(s): "VITAMINB12", "FOLATE", "FERRITIN", "TIBC", "IRON", "RETICCTPCT" in the last 72 hours. Sepsis Labs: Recent Labs  Lab 03/03/22 1957 03/03/22 2202 03/04/22 0059 03/04/22 0754 03/05/22 0423  PROCALCITON  --  <0.10  --  <0.10 <0.10  WBC 6.1  --   --  4.9  --   LATICACIDVEN  --   --  1.2 0.8  --     Microbiology Recent Results (from the past 240 hour(s))  Resp panel by RT-PCR (RSV, Flu A&B, Covid) Anterior Nasal Swab     Status: None   Collection Time: 03/03/22  7:57 PM   Specimen: Anterior Nasal Swab  Result Value Ref Range Status   SARS Coronavirus 2 by RT PCR NEGATIVE NEGATIVE Final    Comment: (NOTE) SARS-CoV-2 target nucleic acids are NOT DETECTED.  The SARS-CoV-2 RNA is generally detectable in upper respiratory specimens during the acute phase of infection. The lowest concentration of SARS-CoV-2 viral copies this assay can detect is 138 copies/mL. A negative result does not preclude SARS-Cov-2 infection and should not be used as the sole basis for treatment or other patient management decisions. A negative result may occur with  improper specimen collection/handling, submission of  specimen other than nasopharyngeal swab, presence of viral mutation(s) within the areas targeted by this assay, and inadequate number of viral copies(<138 copies/mL). A negative result must be combined with clinical observations, patient history, and  epidemiological information. The expected result is Negative.  Fact Sheet for Patients:  EntrepreneurPulse.com.au  Fact Sheet for Healthcare Providers:  IncredibleEmployment.be  This test is no t yet approved or cleared by the [place] FDA and  has been authorized for detection and/or diagnosis of SARS-CoV-2 by FDA under an Emergency Use Authorization (EUA). This EUA will remain  in effect (meaning this test can be used) for the duration of the COVID-19 declaration under Section 564(b)(1) of the Act, 21 U.S.C.section 360bbb-3(b)(1), unless the authorization is terminated  or revoked sooner.       Influenza A by PCR NEGATIVE NEGATIVE Final   Influenza B by PCR NEGATIVE NEGATIVE Final    Comment: (NOTE) The Xpert Xpress SARS-CoV-2/FLU/RSV plus assay is intended as an aid in the diagnosis of influenza from Nasopharyngeal swab specimens and should not be used as a sole basis for treatment. Nasal washings and aspirates are unacceptable for Xpert Xpress SARS-CoV-2/FLU/RSV testing.  Fact Sheet for Patients: EntrepreneurPulse.com.au  Fact Sheet for Healthcare Providers: IncredibleEmployment.be  This test is not yet approved or cleared by the [place] FDA and has been authorized for detection and/or diagnosis of SARS-CoV-2 by FDA under an Emergency Use Authorization (EUA). This EUA will remain in effect (meaning this test can be used) for the duration of the COVID-19 declaration under Section 564(b)(1) of the Act, 21 U.S.C. section 360bbb-3(b)(1), unless the authorization is terminated or revoked.     Resp Syncytial Virus by PCR NEGATIVE NEGATIVE Final    Comment: (NOTE) Fact Sheet for Patients: EntrepreneurPulse.com.au  Fact Sheet for Healthcare Providers: IncredibleEmployment.be  This test is not yet approved or cleared by the [place] FDA and has been  authorized for detection and/or diagnosis of SARS-CoV-2 by FDA under an Emergency Use Authorization (EUA). This EUA will remain in effect (meaning this test can be used) for the duration of the COVID-19 declaration under Section 564(b)(1) of the Act, 21 U.S.C. section 360bbb-3(b)(1), unless the authorization is terminated or revoked.  Performed at [facility], [address] [place], [place] [address]   Culture, blood (single)     Status: Abnormal   Collection Time: 03/03/22  9:11 PM   Specimen: BLOOD  Result Value Ref Range Status   Specimen Description   Final    BLOOD BLOOD LEFT FOREARM Performed at [facility], [address] [place], [place] [address]    Special Requests   Final    BOTTLES DRAWN AEROBIC AND ANAEROBIC Blood Culture results may not be optimal due to an inadequate volume of blood received in culture bottles Performed at [facility], [address], [place], [place] [address]    Culture  Setup Time   Final    GRAM POSITIVE COCCI AEROBIC BOTTLE ONLY CRITICAL RESULT CALLED TO, READ BACK BY AND VERIFIED WITH: ALEX CHAPPELL 03/04/22 @ 1420 BY SB    Culture (A)  Final    STAPHYLOCOCCUS CAPITIS THE SIGNIFICANCE OF ISOLATING THIS ORGANISM FROM A SINGLE SET OF BLOOD CULTURES WHEN MULTIPLE SETS ARE DRAWN IS UNCERTAIN. PLEASE NOTIFY THE MICROBIOLOGY DEPARTMENT WITHIN ONE WEEK IF SPECIATION AND SENSITIVITIES ARE REQUIRED. Performed at [facility], [place] [address] [place], [place] [address]    Report Status 03/06/2022 FINAL  Final  Blood Culture ID  Panel (Reflexed)     Status: Abnormal   Collection Time: 03/03/22  9:11 PM  Result Value Ref Range Status   Enterococcus faecalis NOT DETECTED NOT DETECTED Final   Enterococcus Faecium NOT DETECTED NOT DETECTED Final   Listeria monocytogenes NOT DETECTED NOT DETECTED Final   Staphylococcus species DETECTED (A) NOT DETECTED Final    Comment: CRITICAL RESULT CALLED TO, READ BACK BY AND  VERIFIED WITH: ALEX CHAPPELL 03/04/22 @ 1420 BY SB    Staphylococcus aureus (BCID) NOT DETECTED NOT DETECTED Final   Staphylococcus epidermidis NOT DETECTED NOT DETECTED Final   Staphylococcus lugdunensis NOT DETECTED NOT DETECTED Final   Streptococcus species NOT DETECTED NOT DETECTED Final   Streptococcus agalactiae NOT DETECTED NOT DETECTED Final   Streptococcus pneumoniae NOT DETECTED NOT DETECTED Final   Streptococcus pyogenes NOT DETECTED NOT DETECTED Final   A.calcoaceticus-baumannii NOT DETECTED NOT DETECTED Final   Bacteroides fragilis NOT DETECTED NOT DETECTED Final   Enterobacterales NOT DETECTED NOT DETECTED Final   Enterobacter cloacae complex NOT DETECTED NOT DETECTED Final   Escherichia coli NOT DETECTED NOT DETECTED Final   Klebsiella aerogenes NOT DETECTED NOT DETECTED Final   Klebsiella oxytoca NOT DETECTED NOT DETECTED Final   Klebsiella pneumoniae NOT DETECTED NOT DETECTED Final   Proteus species NOT DETECTED NOT DETECTED Final   Salmonella species NOT DETECTED NOT DETECTED Final   Serratia marcescens NOT DETECTED NOT DETECTED Final   Haemophilus influenzae NOT DETECTED NOT DETECTED Final   Neisseria meningitidis NOT DETECTED NOT DETECTED Final   Pseudomonas aeruginosa NOT DETECTED NOT DETECTED Final   Stenotrophomonas maltophilia NOT DETECTED NOT DETECTED Final   Candida albicans NOT DETECTED NOT DETECTED Final   Candida auris NOT DETECTED NOT DETECTED Final   Candida glabrata NOT DETECTED NOT DETECTED Final   Candida krusei NOT DETECTED NOT DETECTED Final   Candida parapsilosis NOT DETECTED NOT DETECTED Final   Candida tropicalis NOT DETECTED NOT DETECTED Final   Cryptococcus neoformans/gattii NOT DETECTED NOT DETECTED Final    Comment: Performed at [facility], [address] [place], [place] [address]  Culture, blood (Routine X 2) w Reflex to ID Panel     Status: None (Preliminary result)   Collection Time: 03/04/22 12:59 AM   Specimen: BLOOD   Result Value Ref Range Status   Specimen Description BLOOD BLOOD LEFT WRIST  Final   Special Requests IN PEDIATRIC BOTTLE Blood Culture adequate volume  Final   Culture   Final    NO GROWTH 2 DAYS Performed at [facility], [address] [place], [name] [address]    Report Status PENDING  Incomplete    Procedures and diagnostic studies:  No results found.             LOS: 3 days   Basya Casavant  [facility]   Pager on [email or web address]. If 7PM-7AM, please contact night-coverage at [email or web address]     03/06/2022, 4:59 PM

## 2022-03-06 NOTE — Evaluation (Signed)
Physical Therapy Evaluation Patient Details Name: Katherine [name] MRN: [id number] DOB: [birth year] Today's Date: 03/06/2022  History of Present Illness  Pt is an 82 y.o. female presenting to hospital 03/03/22 with c/o SOB (O2 sats in 50's).  Pt admitted with community acquired PNA, possible sepsis, acute on chronic respiratory failure with hypoxia, hypotension, and acute renal failure superimposed on stage 4 CKD.  PMH includes diastolic HF, htn, obesity, COPD, DM, h/o stroke with aphasia, seizure disorder, RA, anemia of CKD, cerebral atrophy, and R TKA.  Chronic 2 L O2 via nasal cannula.  Clinical Impression  Prior to hospital admission, pt was LTC resident at [facility].  Therapist called pt's husband who reports pt has required hoyer lift for transfers to w/c for "a while" and pt is unable to propel self in manual w/c.  Pt appearing confused and difficult to understand pt's sentences intermittently (d/t choice of words used together in sentence).  Pt requesting to get cleaned up d/t bowel movement; NT came to assist and pt mod to max assist logrolling to L in bed for clean-up.  Pt declined any further mobility.  Pt appearing close to baseline level of functional mobility; no acute PT needs identified.  Will sign off at this time.   Recommendations for follow up therapy are one component of a multi-disciplinary discharge planning process, led by the attending physician.  Recommendations may be updated based on patient status, additional functional criteria and insurance authorization.  Follow Up Recommendations No PT follow up      Assistance Recommended at Discharge Frequent or constant Supervision/Assistance  Patient can return home with the following  Two people to help with walking and/or transfers;Two people to help with bathing/dressing/bathroom;Assistance with cooking/housework;Assistance with feeding;Direct supervision/assist for medications management;Direct supervision/assist for  financial management;Assist for transportation;Help with stairs or ramp for entrance    Equipment Recommendations  (pt has needed DME at facility)  Recommendations for Other Services       Functional Status Assessment Patient has not had a recent decline in their functional status     Precautions / Restrictions Precautions Precautions: Fall Restrictions Weight Bearing Restrictions: No      Mobility  Bed Mobility Overal bed mobility: Needs Assistance Bed Mobility: Rolling Rolling: Mod assist, Max assist         General bed mobility comments: logrolling to L in bed; assist to initiate and continue logroll; vc's for technique and reaching to bed rail to assist    Transfers                   General transfer comment: Pt declined to attempt sitting on edge of bed and per pt's husband pt has been hoyer lift to w/c for a while.    Ambulation/Gait               General Gait Details: Pt non-ambulatory baseline.  Stairs            Wheelchair Mobility    Modified Rankin (Stroke Patients Only)       Balance                                             Pertinent Vitals/Pain Pain Assessment Pain Assessment: Faces Faces Pain Scale: No hurt Pain Intervention(s): Limited activity within patient's tolerance, Monitored during session Vitals (HR and O2 on 2 L via nasal cannula) stable  and WFL throughout treatment session.    Home Living Family/patient expects to be discharged to:: Skilled nursing facility                   Additional Comments: Per chart pt is a LTC resident at [facility].    Prior Function               Mobility Comments: Per pt's husband (via phone call), pt uses hoyer lift to get to w/c (does not get to w/c everyday though); unable to self propel w/c.  Requires significant assist for bed mobility. ADLs Comments: Pt needs assist for all ADL's at bed level.     Hand Dominance         Extremity/Trunk Assessment   Upper Extremity Assessment Upper Extremity Assessment:  (Pt noted with arthritic hands; fair B hand grip strength; at least 3/5 AROM elbow flexion/extension and B shoulder flexion to grossly 80 degrees)    Lower Extremity Assessment Lower Extremity Assessment: RLE deficits/detail;LLE deficits/detail;Difficult to assess due to impaired cognition RLE Deficits / Details: Significant plantarflexion contracture noted; at least 3-/5 hip flexion and knee flexion/extension. LLE Deficits / Details: Plantar flexion contracture grossly 20-30 degrees; at least 3-/5 hip flexion and knee flexion/extension    Cervical / Trunk Assessment Cervical / Trunk Assessment: Other exceptions Cervical / Trunk Exceptions: forward head/shoulders  Communication   Communication: Expressive difficulties  Cognition Arousal/Alertness: Awake/alert Behavior During Therapy:  (Appearing confused) Overall Cognitive Status: No family/caregiver present to determine baseline cognitive functioning                                 General Comments: Pt unable to state name or DOB.  Difficult to understand pt's sentences (words not making sense together).  Pt appearing confused in general.        General Comments  Nursing cleared pt for participation in physical therapy.    Exercises     Assessment/Plan    PT Assessment Patient does not need any further PT services  PT Problem List         PT Treatment Interventions      PT Goals (Current goals can be found in the Care Plan section)  Acute Rehab PT Goals Patient Stated Goal: pt's husband requesting pt go back to facility PT Goal Formulation: With family Time For Goal Achievement: 03/20/22 Potential to Achieve Goals: Good    Frequency       Co-evaluation               AM-PAC PT "6 Clicks" Mobility  Outcome Measure Help needed turning from your back to your side while in a flat bed without using bedrails?: A  Lot Help needed moving from lying on your back to sitting on the side of a flat bed without using bedrails?: Total Help needed moving to and from a bed to a chair (including a wheelchair)?: Total Help needed standing up from a chair using your arms (e.g., wheelchair or bedside chair)?: Total Help needed to walk in hospital room?: Total Help needed climbing 3-5 steps with a railing? : Total 6 Click Score: 7    End of Session Equipment Utilized During Treatment: Oxygen (2 L via nasal cannula) Activity Tolerance: Patient tolerated treatment well Patient left: in bed;with call bell/phone within reach;with bed alarm set Nurse Communication: Mobility status;Precautions;Need for lift equipment PT Visit Diagnosis: Muscle weakness (generalized) (M62.81)  Time: 5997-7414 PT Time Calculation (min) (ACUTE ONLY): 15 min   Charges:   PT Evaluation $PT Eval Low Complexity: 1 Low         Gerry Blanchfield, PT 03/06/22, 3:48 PM

## 2022-03-06 NOTE — Care Management Important Message (Signed)
Important Message  Patient Details  Name: Katherine [name] MRN: [id number] Date of Birth: 1941-01-04   Medicare Important Message Given:  N/A - LOS <3 / Initial given by admissions     Dannette Barbara 03/06/2022, 8:45 AM

## 2022-03-06 NOTE — Progress Notes (Signed)
Pt had order for IV team as difficult stick. Asked patient if it was okay to assess her arms for IV access. She asked me," Are you a man" ? I said no & showed her my pony tail. Pt is confused to place, date and time. Continues to say she is in [place] to reorient patient. Touched patients arm and she said refused saying , " Don't touch me- I don't want you to get my blood" Tried explaining one more time to patient about the IV and it's process. She refused.

## 2022-03-06 NOTE — TOC Progression Note (Signed)
Transition of Care [facility]) - Progression Note    Patient Details  Name: Katherine [name] MRN: [id number] Date of Birth: [birth year]  Transition [facility]) CM/SW [place], RN Phone Number: 03/06/2022, 2:44 PM  Clinical Narrative:    Per [name] at [facility], patient is a LTC resident.         Expected Discharge Plan and Services                                               Social Determinants of Health (SDOH) Interventions SDOH Screenings   Food Insecurity: No Food Insecurity (03/05/2022)  Housing: Low Risk  (03/05/2022)  Transportation Needs: No Transportation Needs (03/05/2022)  Utilities: Not At Risk (03/05/2022)  Financial Resource Strain: Low Risk  (06/05/2017)  Physical Activity: Inactive (11/14/2017)  Social Connections: Moderately Integrated (11/19/2017)  Stress: No Stress Concern Present (06/05/2017)  Tobacco Use: Medium Risk (03/05/2022)    Readmission Risk Interventions    12/26/2021   10:12 AM 12/29/2019   10:19 AM 09/26/2019   10:54 AM  Readmission Risk Prevention Plan  Transportation Screening  Complete Complete  Medication Review ([place])  Complete Complete  PCP or Specialist appointment within 3-5 days of discharge  Not Complete   PCP/Specialist Appt Not Complete comments  Will return to SNF   [place] or [place]  Complete   SW Recovery Care/Counseling Consult  Complete Complete  Palliative Care Screening  Not Applicable Not [place]  Complete Not Applicable     Information is confidential and restricted. Go to Review Flowsheets to unlock data.

## 2022-03-07 DIAGNOSIS — E662 Morbid (severe) obesity with alveolar hypoventilation: Secondary | ICD-10-CM | POA: Diagnosis not present

## 2022-03-07 DIAGNOSIS — J9601 Acute respiratory failure with hypoxia: Secondary | ICD-10-CM | POA: Diagnosis not present

## 2022-03-07 DIAGNOSIS — R0602 Shortness of breath: Secondary | ICD-10-CM | POA: Diagnosis not present

## 2022-03-07 DIAGNOSIS — J441 Chronic obstructive pulmonary disease with (acute) exacerbation: Secondary | ICD-10-CM | POA: Diagnosis not present

## 2022-03-07 DIAGNOSIS — I10 Essential (primary) hypertension: Secondary | ICD-10-CM | POA: Diagnosis not present

## 2022-03-07 DIAGNOSIS — I5032 Chronic diastolic (congestive) heart failure: Secondary | ICD-10-CM | POA: Diagnosis not present

## 2022-03-07 DIAGNOSIS — I509 Heart failure, unspecified: Secondary | ICD-10-CM | POA: Diagnosis not present

## 2022-03-07 DIAGNOSIS — N184 Chronic kidney disease, stage 4 (severe): Secondary | ICD-10-CM | POA: Diagnosis not present

## 2022-03-07 DIAGNOSIS — G319 Degenerative disease of nervous system, unspecified: Secondary | ICD-10-CM | POA: Diagnosis not present

## 2022-03-07 DIAGNOSIS — Z7401 Bed confinement status: Secondary | ICD-10-CM | POA: Diagnosis not present

## 2022-03-07 DIAGNOSIS — R52 Pain, unspecified: Secondary | ICD-10-CM | POA: Diagnosis not present

## 2022-03-07 DIAGNOSIS — R4789 Other speech disturbances: Secondary | ICD-10-CM | POA: Diagnosis not present

## 2022-03-07 DIAGNOSIS — R41841 Cognitive communication deficit: Secondary | ICD-10-CM | POA: Diagnosis not present

## 2022-03-07 DIAGNOSIS — R5381 Other malaise: Secondary | ICD-10-CM | POA: Diagnosis not present

## 2022-03-07 DIAGNOSIS — J189 Pneumonia, unspecified organism: Secondary | ICD-10-CM | POA: Diagnosis not present

## 2022-03-07 DIAGNOSIS — R6889 Other general symptoms and signs: Secondary | ICD-10-CM | POA: Diagnosis not present

## 2022-03-07 DIAGNOSIS — M25511 Pain in right shoulder: Secondary | ICD-10-CM | POA: Diagnosis not present

## 2022-03-07 DIAGNOSIS — R569 Unspecified convulsions: Secondary | ICD-10-CM | POA: Diagnosis not present

## 2022-03-07 DIAGNOSIS — G40909 Epilepsy, unspecified, not intractable, without status epilepticus: Secondary | ICD-10-CM | POA: Diagnosis not present

## 2022-03-07 DIAGNOSIS — E119 Type 2 diabetes mellitus without complications: Secondary | ICD-10-CM | POA: Diagnosis not present

## 2022-03-07 DIAGNOSIS — J9622 Acute and chronic respiratory failure with hypercapnia: Secondary | ICD-10-CM | POA: Diagnosis not present

## 2022-03-07 DIAGNOSIS — Z515 Encounter for palliative care: Secondary | ICD-10-CM | POA: Diagnosis not present

## 2022-03-07 DIAGNOSIS — D638 Anemia in other chronic diseases classified elsewhere: Secondary | ICD-10-CM | POA: Diagnosis not present

## 2022-03-07 DIAGNOSIS — G4733 Obstructive sleep apnea (adult) (pediatric): Secondary | ICD-10-CM | POA: Diagnosis not present

## 2022-03-07 DIAGNOSIS — R262 Difficulty in walking, not elsewhere classified: Secondary | ICD-10-CM | POA: Diagnosis not present

## 2022-03-07 DIAGNOSIS — Z741 Need for assistance with personal care: Secondary | ICD-10-CM | POA: Diagnosis not present

## 2022-03-07 DIAGNOSIS — E785 Hyperlipidemia, unspecified: Secondary | ICD-10-CM | POA: Diagnosis not present

## 2022-03-07 DIAGNOSIS — W19XXXA Unspecified fall, initial encounter: Secondary | ICD-10-CM | POA: Diagnosis not present

## 2022-03-07 DIAGNOSIS — Z79899 Other long term (current) drug therapy: Secondary | ICD-10-CM | POA: Diagnosis not present

## 2022-03-07 DIAGNOSIS — N179 Acute kidney failure, unspecified: Secondary | ICD-10-CM | POA: Diagnosis not present

## 2022-03-07 LAB — BLOOD GAS, ARTERIAL
Acid-Base Excess: 10.5 mmol/L — ABNORMAL HIGH (ref 0.0–2.0)
Bicarbonate: 38.1 mmol/L — ABNORMAL HIGH (ref 20.0–28.0)
O2 Content: 2 L/min
O2 Saturation: 95.9 %
Patient temperature: 37
pCO2 arterial: 63 mmHg — ABNORMAL HIGH (ref 32–48)
pH, Arterial: 7.39 (ref 7.35–7.45)
pO2, Arterial: 74 mmHg — ABNORMAL LOW (ref 83–108)

## 2022-03-07 LAB — CBC WITH DIFFERENTIAL/PLATELET
Abs Immature Granulocytes: 0.03 10*3/uL (ref 0.00–0.07)
Basophils Absolute: 0 10*3/uL (ref 0.0–0.1)
Basophils Relative: 0 %
Eosinophils Absolute: 0.1 10*3/uL (ref 0.0–0.5)
Eosinophils Relative: 2 %
HCT: 31.2 % — ABNORMAL LOW (ref 36.0–46.0)
Hemoglobin: 9.3 g/dL — ABNORMAL LOW (ref 12.0–15.0)
Immature Granulocytes: 1 %
Lymphocytes Relative: 36 %
Lymphs Abs: 1.7 10*3/uL (ref 0.7–4.0)
MCH: 27.8 pg (ref 26.0–34.0)
MCHC: 29.8 g/dL — ABNORMAL LOW (ref 30.0–36.0)
MCV: 93.1 fL (ref 80.0–100.0)
Monocytes Absolute: 0.4 10*3/uL (ref 0.1–1.0)
Monocytes Relative: 9 %
Neutro Abs: 2.4 10*3/uL (ref 1.7–7.7)
Neutrophils Relative %: 52 %
Platelets: 149 10*3/uL — ABNORMAL LOW (ref 150–400)
RBC: 3.35 MIL/uL — ABNORMAL LOW (ref 3.87–5.11)
RDW: 13.2 % (ref 11.5–15.5)
WBC: 4.6 10*3/uL (ref 4.0–10.5)
nRBC: 0 % (ref 0.0–0.2)

## 2022-03-07 LAB — BASIC METABOLIC PANEL
Anion gap: 8 (ref 5–15)
BUN: 18 mg/dL (ref 8–23)
CO2: 29 mmol/L (ref 22–32)
Calcium: 8.9 mg/dL (ref 8.9–10.3)
Chloride: 105 mmol/L (ref 98–111)
Creatinine, Ser: 1.7 mg/dL — ABNORMAL HIGH (ref 0.44–1.00)
GFR, Estimated: 30 mL/min — ABNORMAL LOW (ref >60)
Glucose, Bld: 72 mg/dL (ref 70–99)
Potassium: 4.1 mmol/L (ref 3.5–5.1)
Sodium: 142 mmol/L (ref 135–145)

## 2022-03-07 LAB — GLUCOSE, CAPILLARY
Glucose-Capillary: 74 mg/dL (ref 70–99)
Glucose-Capillary: 101 mg/dL — ABNORMAL HIGH (ref 70–99)
Glucose-Capillary: 86 mg/dL (ref 70–99)

## 2022-03-07 MED ORDER — AMOXICILLIN-POT CLAVULANATE 250-125 MG PO TABS: 1.0000 | ORAL_TABLET | Freq: Two times a day (BID) | ORAL | 0 refills | Status: AC

## 2022-03-07 NOTE — Plan of Care (Signed)
   Patient ID: Katherine [name], female   DOB: [birth year], 82 y.o.   MRN: [id number]   Problem: Education: Goal: Ability to describe self-care measures that may prevent or decrease complications (Diabetes Survival Skills Education) will improve Outcome: Adequate for Discharge Goal: Individualized Educational Video(s) Outcome: Adequate for Discharge   Problem: Coping: Goal: Ability to adjust to condition or change in health will improve Outcome: Adequate for Discharge   Problem: Fluid Volume: Goal: Ability to maintain a balanced intake and output will improve Outcome: Adequate for Discharge   Problem: Health Behavior/Discharge Planning: Goal: Ability to identify and utilize available resources and services will improve Outcome: Adequate for Discharge Goal: Ability to manage health-related needs will improve Outcome: Adequate for Discharge   Problem: Metabolic: Goal: Ability to maintain appropriate glucose levels will improve Outcome: Adequate for Discharge   Problem: Nutritional: Goal: Maintenance of adequate nutrition will improve Outcome: Adequate for Discharge Goal: Progress toward achieving an optimal weight will improve Outcome: Adequate for Discharge   Problem: Skin Integrity: Goal: Risk for impaired skin integrity will decrease Outcome: Adequate for Discharge   Problem: Tissue Perfusion: Goal: Adequacy of tissue perfusion will improve Outcome: Adequate for Discharge   Problem: Fluid Volume: Goal: Hemodynamic stability will improve Outcome: Adequate for Discharge   Problem: Clinical Measurements: Goal: Diagnostic test results will improve Outcome: Adequate for Discharge Goal: Signs and symptoms of infection will decrease Outcome: Adequate for Discharge   Problem: Respiratory: Goal: Ability to maintain adequate ventilation will improve Outcome: Adequate for Discharge   Problem: Education: Goal: Knowledge of General Education information will  improve Description: Including pain rating scale, medication(s)/side effects and non-pharmacologic comfort measures Outcome: Adequate for Discharge   Problem: Health Behavior/Discharge Planning: Goal: Ability to manage health-related needs will improve Outcome: Adequate for Discharge   Problem: Clinical Measurements: Goal: Ability to maintain clinical measurements within normal limits will improve Outcome: Adequate for Discharge Goal: Will remain free from infection Outcome: Adequate for Discharge Goal: Diagnostic test results will improve Outcome: Adequate for Discharge Goal: Respiratory complications will improve Outcome: Adequate for Discharge Goal: Cardiovascular complication will be avoided Outcome: Adequate for Discharge   Problem: Activity: Goal: Risk for activity intolerance will decrease Outcome: Adequate for Discharge   Problem: Nutrition: Goal: Adequate nutrition will be maintained Outcome: Adequate for Discharge   Problem: Coping: Goal: Level of anxiety will decrease Outcome: Adequate for Discharge   Problem: Elimination: Goal: Will not experience complications related to bowel motility Outcome: Adequate for Discharge Goal: Will not experience complications related to urinary retention   Haydee Salter, RN  Outcome: Adequate for Discharge   Problem: Pain Managment: Goal: General experience of comfort will improve Outcome: Adequate for Discharge   Problem: Safety: Goal: Ability to remain free from injury will improve Outcome: Adequate for Discharge   Problem: Skin Integrity: Goal: Risk for impaired skin integrity will decrease Outcome: Adequate for Discharge

## 2022-03-07 NOTE — Discharge Summary (Signed)
Physician Discharge Summary   Patient: Katherine [name] MRN: [id number] DOB: 11-27-1940  Admit date:     03/03/2022  Discharge date: 03/07/22  Discharge Physician: Jennye Boroughs   PCP: [facility], [place]   Recommendations at discharge:   Follow-up with your physician in the next home within 3 days of discharge  Discharge Diagnoses: Principal Problem:   CAP (community acquired pneumonia) Active Problems:   Acute on chronic respiratory failure with hypoxia (HCC)   Hypotension   Acute renal failure superimposed on stage 4 chronic kidney disease (HCC)   Chronic diastolic heart failure (HCC)   Seizure disorder (HCC)   COPD (chronic obstructive pulmonary disease) (HCC)   Obesity, Class III, BMI 40-49.9 (morbid obesity) ([place])   Anxiety   Coronary artery disease   OSA (obstructive sleep apnea)   Diabetes mellitus type 2, controlled, with complications ([place])  Resolved Problems:   * No resolved hospital problems. [facility] Course:  Katherine [name] is a 82 y.o. female  with medical history significant for Class III obesity, cognitive dysfunction, probable dementia at baseline, history of stroke, CKD 4, seizure disorder, rheumatoid arthritis, anemia of chronic kidney disease, diastolic CHF, COPD on home O2 at 2 L, DM, cerebral atrophy, hospitalized in November 2023 with status epilepticus requiring intubation for airway protection.  She presented to the hospital from the nursing home because of low oxygen saturation in the 50s.  Reportedly oxygen saturation only improved to the 80s after she was placed on 5 L/min oxygen.  She was hypotensive in the emergency room with BP of 81/60.   She was admitted to the hospital for community-acquired pneumonia complicated by acute hypoxic respiratory failure.  She was treated with empiric IV antibiotics.  Her condition is improved and she is deemed stable for discharge today.  Discharge plan was discussed with her husband over the phone.      Assessment and Plan:  Community-acquired pneumonia: 1 out of 3 blood culture bottles shows Staph capitis which is likely a contaminant.  She is doing much better.  She will be discharged on Augmentin to complete 7 days of treatment.   Acute on chronic hypoxic respiratory failure, chronic hypercapnic respiratory failure: Improved.  She is on 2 L/min oxygen via [place] which is about her baseline.  She uses BiPAP at the nursing facility.  Continue BiPAP at night.  Hypotension: Improved.   AKI on CKD stage IV: AKI has improved and creatinine is at baseline.     Other comorbidities include seizure disorder (on Keppra), chronic diastolic CHF, COPD, type II DM, OSA on CPAP at night, CAD, anxiety, probable dementia at baseline, history of stroke        Consultants: None Procedures performed: None  Disposition: Home Diet recommendation:  Discharge Diet Orders (From admission, onward)     Start     Ordered   03/07/22 0000  Diet - low sodium heart healthy        03/07/22 1120           Cardiac diet DISCHARGE MEDICATION: Allergies as of 03/07/2022       Reactions   Bee Venom Swelling, Other (See Comments)   "Allergic," per MAR   Enalapril Maleate Swelling, Other (See Comments)   "Allergic," per [facility]        Medication List     STOP taking these medications    hydrOXYzine 25 MG tablet Commonly known as: ATARAX   lidocaine 5 % Commonly known as: LIDODERM  TAKE these medications    Acetaminophen 325 MG Caps Take 650 mg by mouth every 8 (eight) hours as needed (pain).   amoxicillin-clavulanate 250-125 MG tablet Commonly known as: AUGMENTIN Take 1 tablet by mouth every 12 (twelve) hours for 7 doses.   carbamide peroxide 6.5 % OTIC solution Commonly known as: DEBROX Place 1 drop into both ears 2 (two) times daily. For 4 days.   eucerin cream Apply 1 application  topically See admin instructions. Apply to bilateral feet at bedtime    Fluticasone-Umeclidin-Vilant 100-62.5-25 MCG/INH Aepb Inhale 1 puff into the lungs in the morning.   gabapentin 100 MG capsule Commonly known as: NEURONTIN Take 2 capsules (200 mg total) by mouth at bedtime.   guaiFENesin 600 MG 12 hr tablet Commonly known as: MUCINEX Take 600 mg by mouth every 12 (twelve) hours as needed for to loosen phlegm (or for secretions).   hydroxychloroquine 200 MG tablet Commonly known as: PLAQUENIL Take 200 mg by mouth See admin instructions. Take 200 mg by mouth at 5 PM daily   ipratropium-albuterol 0.5-2.5 (3) MG/3ML Soln Commonly known as: DUONEB Take 3 mLs by nebulization every 6 (six) hours as needed. What changed:  when to take this additional instructions   Lacosamide 100 MG Tabs Take 1 tablet (100 mg total) by mouth 2 (two) times daily.   levETIRAcetam 500 MG tablet Commonly known as: KEPPRA Take 1 tablet (500 mg total) by mouth 2 (two) times daily.   loratadine 10 MG tablet Commonly known as: CLARITIN Take 10 mg by mouth daily at 6 PM.   OXYGEN Inhale 3 L/min into the lungs continuous.   pantoprazole 20 MG tablet Commonly known as: PROTONIX Take 20 mg by mouth daily before breakfast.   PRESCRIPTION MEDICATION See admin instructions. BiPAP- At bedtime   propranolol 10 MG tablet Commonly known as: INDERAL Take 10 mg by mouth 2 (two) times daily.   sertraline 50 MG tablet Commonly known as: ZOLOFT Take 1 tablet (50 mg total) by mouth daily. What changed: when to take this   sodium chloride 0.65 % Soln nasal spray Commonly known as: OCEAN Place 3 sprays into both nostrils See admin instructions. Administer 3 sprays alternating nostrils three times a day for nasal dryness.        Discharge Exam: Danley Danker Weights   03/03/22 1937 03/07/22 0552  Weight: 102.5 kg 99.9 kg   GEN: NAD SKIN: Warm and dry EYES: No pallor or icterus ENT: MMM CV: RRR PULM: CTA B ABD: soft, obese, NT, +BS CNS: AAO x 1 (person), non focal EXT:  No edema or tenderness   Condition at discharge: good  The results of significant diagnostics from this hospitalization (including imaging, microbiology, ancillary and laboratory) are listed below for reference.   Imaging Studies: DG Chest Portable 1 View  Result Date: 03/03/2022 CLINICAL DATA:  Shortness of breath EXAM: PORTABLE CHEST 1 VIEW COMPARISON:  Chest x-ray 12/23/2021 FINDINGS: There are minimal patchy and strandy opacities in the right lower lung. There is no pleural effusion or pneumothorax. The cardiomediastinal silhouette is within normal limits. No acute fractures are seen. IMPRESSION: Minimal patchy and strandy opacities in the right lower lung could reflect atelectasis or infection. Electronically Signed   By: Ronney Asters M.D.   On: 03/03/2022 20:20    Microbiology: Results for orders placed or performed during the hospital encounter of 03/03/22  Resp panel by RT-PCR (RSV, Flu A&B, Covid) Anterior Nasal Swab     Status: None  Collection Time: 03/03/22  7:57 PM   Specimen: Anterior Nasal Swab  Result Value Ref Range Status   SARS Coronavirus 2 by RT PCR NEGATIVE NEGATIVE Final    Comment: (NOTE) SARS-CoV-2 target nucleic acids are NOT DETECTED.  The SARS-CoV-2 RNA is generally detectable in upper respiratory specimens during the acute phase of infection. The lowest concentration of SARS-CoV-2 viral copies this assay can detect is 138 copies/mL. A negative result does not preclude SARS-Cov-2 infection and should not be used as the sole basis for treatment or other patient management decisions. A negative result may occur with  improper specimen collection/handling, submission of specimen other than nasopharyngeal swab, presence of viral mutation(s) within the areas targeted by this assay, and inadequate number of viral copies(<138 copies/mL). A negative result must be combined with clinical observations, patient history, and epidemiological information. The expected  result is Negative.  Fact Sheet for Patients:  EntrepreneurPulse.com.au  Fact Sheet for Healthcare Providers:  IncredibleEmployment.be  This test is no t yet approved or cleared by the [place] FDA and  has been authorized for detection and/or diagnosis of SARS-CoV-2 by FDA under an Emergency Use Authorization (EUA). This EUA will remain  in effect (meaning this test can be used) for the duration of the COVID-19 declaration under Section 564(b)(1) of the Act, 21 U.S.C.section 360bbb-3(b)(1), unless the authorization is terminated  or revoked sooner.       Influenza A by PCR NEGATIVE NEGATIVE Final   Influenza B by PCR NEGATIVE NEGATIVE Final    Comment: (NOTE) The Xpert Xpress SARS-CoV-2/FLU/RSV plus assay is intended as an aid in the diagnosis of influenza from Nasopharyngeal swab specimens and should not be used as a sole basis for treatment. Nasal washings and aspirates are unacceptable for Xpert Xpress SARS-CoV-2/FLU/RSV testing.  Fact Sheet for Patients: EntrepreneurPulse.com.au  Fact Sheet for Healthcare Providers: IncredibleEmployment.be  This test is not yet approved or cleared by the [place] FDA and has been authorized for detection and/or diagnosis of SARS-CoV-2 by FDA under an Emergency Use Authorization (EUA). This EUA will remain in effect (meaning this test can be used) for the duration of the COVID-19 declaration under Section 564(b)(1) of the Act, 21 U.S.C. section 360bbb-3(b)(1), unless the authorization is terminated or revoked.     Resp Syncytial Virus by PCR NEGATIVE NEGATIVE Final    Comment: (NOTE) Fact Sheet for Patients: EntrepreneurPulse.com.au  Fact Sheet for Healthcare Providers: IncredibleEmployment.be  This test is not yet approved or cleared by the [place] FDA and has been authorized for detection and/or diagnosis of  SARS-CoV-2 by FDA under an Emergency Use Authorization (EUA). This EUA will remain in effect (meaning this test can be used) for the duration of the COVID-19 declaration under Section 564(b)(1) of the Act, 21 U.S.C. section 360bbb-3(b)(1), unless the authorization is terminated or revoked.  Performed at [facility], [address] [place], [place] [address]   Culture, blood (single)     Status: Abnormal   Collection Time: 03/03/22  9:11 PM   Specimen: BLOOD  Result Value Ref Range Status   Specimen Description   Final    BLOOD BLOOD LEFT FOREARM Performed at [facility], [address] [place], [place] [address]    Special Requests   Final    BOTTLES DRAWN AEROBIC AND ANAEROBIC Blood Culture results may not be optimal due to an inadequate volume of blood received in culture bottles Performed at [facility], [address], [place], [place] [address]  Culture  Setup Time   Final    GRAM POSITIVE COCCI AEROBIC BOTTLE ONLY CRITICAL RESULT CALLED TO, READ BACK BY AND VERIFIED WITH: ALEX CHAPPELL 03/04/22 @ 1420 BY SB    Culture (A)  Final    STAPHYLOCOCCUS CAPITIS THE SIGNIFICANCE OF ISOLATING THIS ORGANISM FROM A SINGLE SET OF BLOOD CULTURES WHEN MULTIPLE SETS ARE DRAWN IS UNCERTAIN. PLEASE NOTIFY THE MICROBIOLOGY DEPARTMENT WITHIN ONE WEEK IF SPECIATION AND SENSITIVITIES ARE REQUIRED. Performed at [facility], [address], [place], [place] [address]    Report Status 03/06/2022 FINAL  Final  Blood Culture ID Panel (Reflexed)     Status: Abnormal   Collection Time: 03/03/22  9:11 PM  Result Value Ref Range Status   Enterococcus faecalis NOT DETECTED NOT DETECTED Final   Enterococcus Faecium NOT DETECTED NOT DETECTED Final   Listeria monocytogenes NOT DETECTED NOT DETECTED Final   Staphylococcus species DETECTED (A) NOT DETECTED Final    Comment: CRITICAL RESULT CALLED TO, READ BACK BY AND VERIFIED WITH: ALEX CHAPPELL 03/04/22 @ 1420 BY  SB    Staphylococcus aureus (BCID) NOT DETECTED NOT DETECTED Final   Staphylococcus epidermidis NOT DETECTED NOT DETECTED Final   Staphylococcus lugdunensis NOT DETECTED NOT DETECTED Final   Streptococcus species NOT DETECTED NOT DETECTED Final   Streptococcus agalactiae NOT DETECTED NOT DETECTED Final   Streptococcus pneumoniae NOT DETECTED NOT DETECTED Final   Streptococcus pyogenes NOT DETECTED NOT DETECTED Final   A.calcoaceticus-baumannii NOT DETECTED NOT DETECTED Final   Bacteroides fragilis NOT DETECTED NOT DETECTED Final   Enterobacterales NOT DETECTED NOT DETECTED Final   Enterobacter cloacae complex NOT DETECTED NOT DETECTED Final   Escherichia coli NOT DETECTED NOT DETECTED Final   Klebsiella aerogenes NOT DETECTED NOT DETECTED Final   Klebsiella oxytoca NOT DETECTED NOT DETECTED Final   Klebsiella pneumoniae NOT DETECTED NOT DETECTED Final   Proteus species NOT DETECTED NOT DETECTED Final   Salmonella species NOT DETECTED NOT DETECTED Final   Serratia marcescens NOT DETECTED NOT DETECTED Final   Haemophilus influenzae NOT DETECTED NOT DETECTED Final   Neisseria meningitidis NOT DETECTED NOT DETECTED Final   Pseudomonas aeruginosa NOT DETECTED NOT DETECTED Final   Stenotrophomonas maltophilia NOT DETECTED NOT DETECTED Final   Candida albicans NOT DETECTED NOT DETECTED Final   Candida auris NOT DETECTED NOT DETECTED Final   Candida glabrata NOT DETECTED NOT DETECTED Final   Candida krusei NOT DETECTED NOT DETECTED Final   Candida parapsilosis NOT DETECTED NOT DETECTED Final   Candida tropicalis NOT DETECTED NOT DETECTED Final   Cryptococcus neoformans/gattii NOT DETECTED NOT DETECTED Final    Comment: Performed at [facility], [address] [place], [place] [address]  Culture, blood (Routine X 2) w Reflex to ID Panel     Status: None (Preliminary result)   Collection Time: 03/04/22 12:59 AM   Specimen: BLOOD  Result Value Ref Range Status   Specimen  Description BLOOD BLOOD LEFT WRIST  Final   Special Requests IN PEDIATRIC BOTTLE Blood Culture adequate volume  Final   Culture   Final    NO GROWTH 3 DAYS Performed at [facility], [address] [place],  [address]    Report Status PENDING  Incomplete    Labs: CBC: Recent Labs  Lab 03/03/22 1957 03/04/22 0754 03/07/22 0400  WBC 6.1 4.9 4.6  NEUTROABS 3.3  --  2.4  HGB 10.2* 9.2* 9.3*  HCT 34.6* 31.2* 31.2*  MCV 93.5 94.5 93.1  PLT  247 189 740*   Basic Metabolic Panel: Recent Labs  Lab 03/03/22 1957 03/04/22 0754 03/05/22 0423 03/07/22 0400  NA 139 139 143 142  K 4.7 5.3* 4.4 4.1  CL 95* 99 105 105  CO2 35* 35* 35* 29  GLUCOSE 116* 92 87 72  BUN 49* 44* 36* 18  CREATININE 2.75* 2.44* 1.96* 1.70*  CALCIUM 9.2 8.5* 8.6* 8.9   Liver Function Tests: No results for input(s): "AST", "ALT", "ALKPHOS", "BILITOT", "PROT", "ALBUMIN" in the last 168 hours. CBG: Recent Labs  Lab 03/06/22 0810 03/06/22 1222 03/06/22 1609 03/06/22 2027 03/07/22 0828  GLUCAP 79 83 92 83 74    Discharge time spent: greater than 30 minutes.  Signed: Jennye Boroughs, MD [facility] 03/07/2022

## 2022-03-07 NOTE — Consult Note (Signed)
   [facility] Inpatient Consult   03/07/2022  Orit [place] [birth year] [id number]  [place] [facility] [ACO] Patient: [facility] Liaison remote coverage for patient with extreme high risk  Primary Care Provider:  Care, [place], Patient is long term SNF.   Plan:   No THN needs due to long term care at SNF.  For questions or referrals, please contact:   Natividad Brood, RN BSN [place]  [phone] business mobile phone Toll free office [phone]  *[place]  [phone] Fax number: [phone] [email or web address]

## 2022-03-07 NOTE — TOC Progression Note (Signed)
Transition of Care [facility]) - Progression Note    Patient Details  Name: Katherine [name] MRN: [id number] Date of Birth: 1940-11-20  Transition of Care [facility]) CM/SW Contact  Laurena Slimmer, RN Phone Number: 03/07/2022, 9:46 AM  Clinical Narrative:    Damaris Schooner with Tora Kindred from [facility]. Patient was previously on BIPAP at facility. They are unable to accommodate trilogys        Expected Discharge Plan and Services                                               Social Determinants of Health (SDOH) Interventions SDOH Screenings   Food Insecurity: No Food Insecurity (03/05/2022)  Housing: Low Risk  (03/05/2022)  Transportation Needs: No Transportation Needs (03/05/2022)  Utilities: Not At Risk (03/05/2022)  Financial Resource Strain: Low Risk  (06/05/2017)  Physical Activity: Inactive (11/14/2017)  Social Connections: Moderately Integrated (11/19/2017)  Stress: No Stress Concern Present (06/05/2017)  Tobacco Use: Medium Risk (03/05/2022)    Readmission Risk Interventions    12/26/2021   10:12 AM 12/29/2019   10:19 AM 09/26/2019   10:54 AM  Readmission Risk Prevention Plan  Transportation Screening  Complete Complete  Medication Review ([place])  Complete Complete  PCP or Specialist appointment within 3-5 days of discharge  Not Complete   PCP/Specialist Appt Not Complete comments  Will return to SNF   [place] or [place]  Complete   SW Recovery Care/Counseling Consult  Complete Complete  Palliative Care Screening  Not Applicable Not [place]  Complete Not Applicable     Information is confidential and restricted. Go to Review Flowsheets to unlock data.

## 2022-03-07 NOTE — TOC Transition Note (Addendum)
Transition of Care [facility]) - CM/SW Discharge Note   Patient Details  Name: Katherine [name] MRN: [id number] Date of Birth: 1940-07-04  Transition of Care [facility]) CM/SW Contact:  Laurena Slimmer, RN Phone Number: 03/07/2022, 12:08 PM   Clinical Narrative:    Damaris Schooner with Tora Kindred in admissions at [facility]. Patient admission for today was confirmed. She was assigned room number 38-A. Nurse will call report to [phone].   Attempt to notify patient's spouse at [phone], [phone], and [phone]. No answer. Able to leave a message. Requested call back to this RNCM. Attempt to reach patient's daughter. Phone not in working order.   Discharge summary  and transfer report sent in the HUB Face sheet and medical necessity forms printed to the floor to be added to the EMS packet.  EMS arranged   Nurse notified.   Patient's spouse contacted. He is aware she will discharge to [facility] today.  TOC signing off.    Final next level of care: [place] Barriers to Discharge: Barriers Resolved   Patient Goals and CMS Choice      Discharge Placement                  Patient to be transferred to facility by: [facility]   Patient and family notified of of transfer: 03/07/22  Discharge Plan and Services Additional resources added to the After Visit Summary for                                       Social Determinants of Health (SDOH) Interventions SDOH Screenings   Food Insecurity: No Food Insecurity (03/05/2022)  Housing: Low Risk  (03/05/2022)  Transportation Needs: No Transportation Needs (03/05/2022)  Utilities: Not At Risk (03/05/2022)  Financial Resource Strain: Low Risk  (06/05/2017)  Physical Activity: Inactive (11/14/2017)  Social Connections: Moderately Integrated (11/19/2017)  Stress: No Stress Concern Present (06/05/2017)  Tobacco Use: Medium Risk (03/05/2022)     Readmission Risk Interventions    12/26/2021   10:12 AM  12/29/2019   10:19 AM 09/26/2019   10:54 AM  Readmission Risk Prevention Plan  Transportation Screening  Complete Complete  Medication Review ([place])  Complete Complete  PCP or Specialist appointment within 3-5 days of discharge  Not Complete   PCP/Specialist Appt Not Complete comments  Will return to SNF   [place] or [place]  Complete   SW Recovery Care/Counseling Consult  Complete Complete  Palliative Care Screening  Not Applicable Not [place]  Complete Not Applicable     Information is confidential and restricted. Go to Review Flowsheets to unlock data.

## 2022-03-08 DIAGNOSIS — I5032 Chronic diastolic (congestive) heart failure: Secondary | ICD-10-CM | POA: Diagnosis not present

## 2022-03-08 DIAGNOSIS — E785 Hyperlipidemia, unspecified: Secondary | ICD-10-CM | POA: Diagnosis not present

## 2022-03-08 DIAGNOSIS — D638 Anemia in other chronic diseases classified elsewhere: Secondary | ICD-10-CM | POA: Diagnosis not present

## 2022-03-08 DIAGNOSIS — G4733 Obstructive sleep apnea (adult) (pediatric): Secondary | ICD-10-CM | POA: Diagnosis not present

## 2022-03-08 DIAGNOSIS — E119 Type 2 diabetes mellitus without complications: Secondary | ICD-10-CM | POA: Diagnosis not present

## 2022-03-08 DIAGNOSIS — J9622 Acute and chronic respiratory failure with hypercapnia: Secondary | ICD-10-CM | POA: Diagnosis not present

## 2022-03-08 DIAGNOSIS — R262 Difficulty in walking, not elsewhere classified: Secondary | ICD-10-CM | POA: Diagnosis not present

## 2022-03-08 DIAGNOSIS — G40909 Epilepsy, unspecified, not intractable, without status epilepticus: Secondary | ICD-10-CM | POA: Diagnosis not present

## 2022-03-09 DIAGNOSIS — Z79899 Other long term (current) drug therapy: Secondary | ICD-10-CM | POA: Diagnosis not present

## 2022-03-09 DIAGNOSIS — J9622 Acute and chronic respiratory failure with hypercapnia: Secondary | ICD-10-CM | POA: Diagnosis not present

## 2022-03-09 LAB — CULTURE, BLOOD (ROUTINE X 2)
Culture: NO GROWTH
Special Requests: ADEQUATE

## 2022-03-10 ENCOUNTER — Non-Acute Institutional Stay: Payer: Medicare HMO | Admitting: Nurse Practitioner

## 2022-03-10 VITALS — BP 125/85 | HR 86 | Temp 97.5°F | Resp 18 | Wt 215.2 lb

## 2022-03-10 DIAGNOSIS — Z515 Encounter for palliative care: Secondary | ICD-10-CM

## 2022-03-10 DIAGNOSIS — R5381 Other malaise: Secondary | ICD-10-CM

## 2022-03-10 DIAGNOSIS — I509 Heart failure, unspecified: Secondary | ICD-10-CM

## 2022-03-10 DIAGNOSIS — R569 Unspecified convulsions: Secondary | ICD-10-CM | POA: Diagnosis not present

## 2022-03-10 DIAGNOSIS — R0602 Shortness of breath: Secondary | ICD-10-CM | POA: Diagnosis not present

## 2022-03-10 NOTE — Progress Notes (Signed)
[facility] Palliative Care Consult Note Telephone: [phone]  Fax: [phone]    Date of encounter: 03/10/22 7:24 PM PATIENT NAME: Katherine [name] [address] [address]   [phone] (home)  DOB: 10-12-1940 MRN: [id number]  PRIMARY CARE PROVIDER:    Care, [place] [address] [phone]   RESPONSIBLE PARTY:    Contact Information       Name Relation Home Work [place] Spouse [phone]   [phone]    Katherine [name], Katherine [name] Daughter [phone]   [phone]       I met face to face with patient in facility. Palliative Care was asked to follow this patient by consultation request of  Care, [place] to address advance care planning and complex medical decision making. This is a follow up visit.                                  ASSESSMENT AND PLAN / RECOMMENDATIONS:  Symptom Management/Plan: 1. ACP: remains a full code; most form in ACP   2. Edema stable; secondary to CHF, monitor weights, elevate; discussed nutrition with Katherine [name]   3. Shortness of breath secondary to CHF, intermit symptoms;  continue diuresing, inhalation therapy; Continuous O2,  Continue daily weights;    10/23/2020 weight 249.1 lbs 12/24/2020 weight 245.1 lbs 01/16/2021 weight 239.0 lbs 04/30/2021 weight 241.2 lbs 05/18/2021 weight 240 lbs 10/21/2021 weight 229 lbs 11/16/2021 weight 233.5 lbs 01/13/2022 weight 233 lbs 4. Debility; discussed with Katherine [name] about getting oob which she declines. Revisited with Katherine [name] updated Katherine [name] getting out of bed, explained non-compliance, if she declines to staff when they try to get her oob.    5. Palliative care encounter; Palliative medicine team will continue to support patient, patient's family, and medical team. Visit consisted of counseling and education dealing with the complex and emotionally intense issues of symptom management and palliative care  in the setting of serious and potentially life-threatening illness   Follow up Palliative Care Visit: Palliative care will continue to follow for complex medical decision making, advance care planning, and clarification of goals. Return 4 to 8 weeks or prn.   I spent 47 minutes providing this consultation starting at 1:45 pm. More than 50% of the time in this consultation was spent in counseling and care coordination. PPS: 30%   Chief Complaint: Follow up palliative consult for complex medical decision making   HISTORY OF PRESENT ILLNESS:  Katherine [name] is a 82 y.o. 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 Katherine [name] resides at [place] at [facility]. Katherine [name] remains bed bound, require staff to assist her with turning, positioning, bathing, dressing. Katherine [name] does feed herself after tray setup. Katherine [name] has been feeding herself per staff.    Medical goals, medications reviewed. No new changes to poc. Updated staff, attempted to contact husband, unable to reach.    Therapeutic listening, emotional support provided. Updated. Staff.    [name]  History obtained from review of EMR, discussion with primary team, and interview with family, facility staff/caregiver and/or Katherine [name].  I reviewed available labs, medications, imaging, studies and related documents from the EMR.  Records reviewed and summarized  above.    Physical Exam: Constitutional: NAD General: obese, debilitated, chronically ill female EYES:  lids intact ENMT: oral mucous membranes moist CV: S1S2, RRR Pulmonary: decrease bases, no increased work of breathing, no cough MSK: bed-bound; non-ambulatory Skin: warm and dry Neuro:  + generalized weakness,  + cognitive impairment Psych: flat affect, A and Oriented to self Thank you for the opportunity to participate in the care of Katherine [name]. Please call our office at [phone] if we can be of additional assistance.   Shadie Sweatman Ihor Gully, NP

## 2022-03-13 DIAGNOSIS — J9622 Acute and chronic respiratory failure with hypercapnia: Secondary | ICD-10-CM | POA: Diagnosis not present

## 2022-03-13 DIAGNOSIS — J189 Pneumonia, unspecified organism: Secondary | ICD-10-CM | POA: Diagnosis not present

## 2022-03-14 DIAGNOSIS — M25511 Pain in right shoulder: Secondary | ICD-10-CM | POA: Diagnosis not present

## 2022-03-14 DIAGNOSIS — W19XXXA Unspecified fall, initial encounter: Secondary | ICD-10-CM | POA: Diagnosis not present

## 2022-03-14 DIAGNOSIS — R52 Pain, unspecified: Secondary | ICD-10-CM | POA: Diagnosis not present

## 2022-03-15 DIAGNOSIS — N390 Urinary tract infection, site not specified: Secondary | ICD-10-CM | POA: Diagnosis not present

## 2022-03-15 DIAGNOSIS — E662 Morbid (severe) obesity with alveolar hypoventilation: Secondary | ICD-10-CM | POA: Diagnosis not present

## 2022-03-15 DIAGNOSIS — G4089 Other seizures: Secondary | ICD-10-CM | POA: Diagnosis not present

## 2022-03-15 DIAGNOSIS — I10 Essential (primary) hypertension: Secondary | ICD-10-CM | POA: Diagnosis not present

## 2022-03-15 DIAGNOSIS — E1122 Type 2 diabetes mellitus with diabetic chronic kidney disease: Secondary | ICD-10-CM | POA: Diagnosis not present

## 2022-03-15 DIAGNOSIS — E7849 Other hyperlipidemia: Secondary | ICD-10-CM | POA: Diagnosis not present

## 2022-03-15 DIAGNOSIS — M25519 Pain in unspecified shoulder: Secondary | ICD-10-CM | POA: Diagnosis not present

## 2022-03-16 DIAGNOSIS — N183 Chronic kidney disease, stage 3 unspecified: Secondary | ICD-10-CM | POA: Diagnosis not present

## 2022-03-16 DIAGNOSIS — Z0189 Encounter for other specified special examinations: Secondary | ICD-10-CM | POA: Diagnosis not present

## 2022-03-17 DIAGNOSIS — N183 Chronic kidney disease, stage 3 unspecified: Secondary | ICD-10-CM | POA: Diagnosis not present

## 2022-03-20 DIAGNOSIS — J9622 Acute and chronic respiratory failure with hypercapnia: Secondary | ICD-10-CM | POA: Diagnosis not present

## 2022-03-20 DIAGNOSIS — I5032 Chronic diastolic (congestive) heart failure: Secondary | ICD-10-CM | POA: Diagnosis not present

## 2022-03-20 DIAGNOSIS — J449 Chronic obstructive pulmonary disease, unspecified: Secondary | ICD-10-CM | POA: Diagnosis not present

## 2022-03-22 DIAGNOSIS — G4733 Obstructive sleep apnea (adult) (pediatric): Secondary | ICD-10-CM | POA: Diagnosis not present

## 2022-03-22 DIAGNOSIS — Z760 Encounter for issue of repeat prescription: Secondary | ICD-10-CM | POA: Diagnosis not present

## 2022-03-27 DIAGNOSIS — G4733 Obstructive sleep apnea (adult) (pediatric): Secondary | ICD-10-CM | POA: Diagnosis not present

## 2022-03-30 DIAGNOSIS — Z7189 Other specified counseling: Secondary | ICD-10-CM | POA: Diagnosis not present

## 2022-03-30 DIAGNOSIS — Z91148 Patient's other noncompliance with medication regimen for other reason: Secondary | ICD-10-CM | POA: Diagnosis not present

## 2022-04-06 DIAGNOSIS — M6281 Muscle weakness (generalized): Secondary | ICD-10-CM | POA: Diagnosis not present

## 2022-04-06 DIAGNOSIS — J9601 Acute respiratory failure with hypoxia: Secondary | ICD-10-CM | POA: Diagnosis not present

## 2022-04-07 ENCOUNTER — Non-Acute Institutional Stay: Payer: Medicare HMO | Admitting: Nurse Practitioner

## 2022-04-07 ENCOUNTER — Encounter: Payer: Self-pay | Admitting: Nurse Practitioner

## 2022-04-07 VITALS — BP 125/84 | HR 86 | Temp 97.5°F | Resp 18 | Wt 216.5 lb

## 2022-04-07 DIAGNOSIS — R5381 Other malaise: Secondary | ICD-10-CM

## 2022-04-07 DIAGNOSIS — R0602 Shortness of breath: Secondary | ICD-10-CM

## 2022-04-07 DIAGNOSIS — Z515 Encounter for palliative care: Secondary | ICD-10-CM

## 2022-04-07 DIAGNOSIS — I509 Heart failure, unspecified: Secondary | ICD-10-CM | POA: Diagnosis not present

## 2022-04-07 NOTE — Progress Notes (Signed)
[facility]er, [facility] Palliative Care Consult Note Telephone: [phone]  Fax: [phone]    Date of encounter: 04/07/22 7:47 PM PATIENT NAME: Katherine [name] [id number] [place] [address]   [phone] (home)  DOB: 1940/05/20 MRN: [id number] PRIMARY CARE PROVIDER:    Care, [place] [address] [phone]  RESPONSIBLE PARTY:    Contact Information       Name Relation Home Work Katherine [name] Spouse [phone]   [phone]    Katherine [name], Katherine [name] Daughter [phone]   [phone]       I met face to face with patient in facility. Palliative Care was asked to follow this patient by consultation request of  Care, [place] to address advance care planning and complex medical decision making. This is a follow up visit.                                  ASSESSMENT AND PLAN / RECOMMENDATIONS:  Symptom Management/Plan: 1. ACP: remains a full code; most form in ACP   2. Edema stable; secondary to CHF, monitor weights, elevate; discussed nutrition with [name]   3. Shortness of breath secondary to CHF, intermit symptoms;  continue diuresing, inhalation therapy; Continuous O2,  Continue daily weights;    01/13/2022 weight 233 lbs 03/08/2022 weight 215.2 lbs 03/20/2022 weight 216.5 lbs 4. Debility; discussed with [name] about getting oob which she declines.  5. Palliative care encounter; Palliative medicine team will continue to support patient, patient's family, and medical team. Visit consisted of counseling and education dealing with the complex and emotionally intense issues of symptom management and palliative care in the setting of serious and potentially life-threatening illness   Follow up Palliative Care Visit: Palliative care will continue to follow for complex medical decision making, advance care planning, and clarification of goals. Return 4 to 8 weeks or prn.   I spent 45 minutes  providing this consultation. More than 50% of the time in this consultation was spent in counseling and care coordination. PPS: 30%   Chief Complaint: Follow up palliative consult for complex medical decision making   HISTORY OF PRESENT ILLNESS:  Katherine [name] is a 82 y.o. 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 [name] resides at [place] at [facility]. [name] remains bed bound, require staff to assist her with turning, positioning, bathing, dressing. [name] does feed herself after tray setup. [name] has been feeding herself per staff. At present [name] is lying in bed, declines getting oob. Appears comfortable. [name] makes eye contact, feeding herself lunch. [name] was complaining about the food, difficulty redirecting her topic of discussion. [name] was cooperative with assessment, ros, quality of life attempted to be discussed. Limited with cognitive impairment. [name] was very focused on her food. Therapeutic listening, emotional support provided. Questions answered. Supportive visit. Medical goals, primary notes, medications reviewed. No new changes to poc. Updated staff, attempted to contact husband, unable to  reach. Therapeutic listening, emotional support provided. Updated. Staff.   PC f/u visit further discussion monitor trends of appetite, weights, monitor for functional,  cognitive decline with chronic disease progression, assess any active symptoms, supportive role.   [name]  History obtained from review of EMR, discussion with primary team, and interview with family, facility staff/caregiver and/or [name].  I reviewed available labs, medications, imaging, studies and related documents from the EMR.  Records reviewed and summarized above.    Physical Exam: General: obese, debilitated, chronically ill female ENMT: oral mucous membranes moist CV: S1S2, RRR Pulmonary: decrease bases few scattered rhonchi MSK: bed-bound; non-ambulatory Neuro:  + generalized weakness,  + cognitive impairment Psych: flat affect, A and Oriented to self   Thank you for the opportunity to participate in the care of [name]. Please call our office at [phone] if we can be of additional assistance.   Rosana Farnell Ihor Gully, NP

## 2022-04-11 DIAGNOSIS — Z79899 Other long term (current) drug therapy: Secondary | ICD-10-CM | POA: Diagnosis not present

## 2022-04-12 DIAGNOSIS — J9612 Chronic respiratory failure with hypercapnia: Secondary | ICD-10-CM | POA: Diagnosis not present

## 2022-04-12 DIAGNOSIS — N183 Chronic kidney disease, stage 3 unspecified: Secondary | ICD-10-CM | POA: Diagnosis not present

## 2022-04-12 DIAGNOSIS — J9611 Chronic respiratory failure with hypoxia: Secondary | ICD-10-CM | POA: Diagnosis not present

## 2022-04-12 DIAGNOSIS — I10 Essential (primary) hypertension: Secondary | ICD-10-CM | POA: Diagnosis not present

## 2022-04-12 DIAGNOSIS — I2510 Atherosclerotic heart disease of native coronary artery without angina pectoris: Secondary | ICD-10-CM | POA: Diagnosis not present

## 2022-04-12 DIAGNOSIS — N184 Chronic kidney disease, stage 4 (severe): Secondary | ICD-10-CM | POA: Diagnosis not present

## 2022-04-12 DIAGNOSIS — J449 Chronic obstructive pulmonary disease, unspecified: Secondary | ICD-10-CM | POA: Diagnosis not present

## 2022-04-12 DIAGNOSIS — J810 Acute pulmonary edema: Secondary | ICD-10-CM | POA: Diagnosis not present

## 2022-04-20 DIAGNOSIS — G40909 Epilepsy, unspecified, not intractable, without status epilepticus: Secondary | ICD-10-CM | POA: Diagnosis not present

## 2022-04-20 DIAGNOSIS — Z760 Encounter for issue of repeat prescription: Secondary | ICD-10-CM | POA: Diagnosis not present

## 2022-04-21 DIAGNOSIS — Z79899 Other long term (current) drug therapy: Secondary | ICD-10-CM | POA: Diagnosis not present

## 2022-05-15 DIAGNOSIS — R52 Pain, unspecified: Secondary | ICD-10-CM | POA: Diagnosis not present

## 2022-05-17 DIAGNOSIS — G40909 Epilepsy, unspecified, not intractable, without status epilepticus: Secondary | ICD-10-CM | POA: Diagnosis not present

## 2022-05-18 DIAGNOSIS — M25511 Pain in right shoulder: Secondary | ICD-10-CM | POA: Diagnosis not present

## 2022-05-18 DIAGNOSIS — R52 Pain, unspecified: Secondary | ICD-10-CM | POA: Diagnosis not present

## 2022-05-19 DIAGNOSIS — J189 Pneumonia, unspecified organism: Secondary | ICD-10-CM | POA: Diagnosis not present

## 2022-05-19 DIAGNOSIS — J9601 Acute respiratory failure with hypoxia: Secondary | ICD-10-CM | POA: Diagnosis not present

## 2022-05-19 DIAGNOSIS — J811 Chronic pulmonary edema: Secondary | ICD-10-CM | POA: Diagnosis not present

## 2022-05-22 DIAGNOSIS — I10 Essential (primary) hypertension: Secondary | ICD-10-CM | POA: Diagnosis not present

## 2022-05-22 DIAGNOSIS — E875 Hyperkalemia: Secondary | ICD-10-CM | POA: Diagnosis not present

## 2022-05-23 DIAGNOSIS — E875 Hyperkalemia: Secondary | ICD-10-CM | POA: Diagnosis not present

## 2022-05-24 DIAGNOSIS — E875 Hyperkalemia: Secondary | ICD-10-CM | POA: Diagnosis not present

## 2022-05-25 DIAGNOSIS — I10 Essential (primary) hypertension: Secondary | ICD-10-CM | POA: Diagnosis not present

## 2022-05-25 DIAGNOSIS — E875 Hyperkalemia: Secondary | ICD-10-CM | POA: Diagnosis not present

## 2022-05-26 DIAGNOSIS — E8809 Other disorders of plasma-protein metabolism, not elsewhere classified: Secondary | ICD-10-CM | POA: Diagnosis not present

## 2022-05-30 DIAGNOSIS — J189 Pneumonia, unspecified organism: Secondary | ICD-10-CM | POA: Diagnosis not present

## 2022-05-30 DIAGNOSIS — N390 Urinary tract infection, site not specified: Secondary | ICD-10-CM | POA: Diagnosis not present

## 2022-05-31 DIAGNOSIS — M12811 Other specific arthropathies, not elsewhere classified, right shoulder: Secondary | ICD-10-CM | POA: Diagnosis not present

## 2022-05-31 DIAGNOSIS — J189 Pneumonia, unspecified organism: Secondary | ICD-10-CM | POA: Diagnosis not present

## 2022-06-01 DIAGNOSIS — J449 Chronic obstructive pulmonary disease, unspecified: Secondary | ICD-10-CM | POA: Diagnosis not present

## 2022-06-06 DIAGNOSIS — R058 Other specified cough: Secondary | ICD-10-CM | POA: Diagnosis not present

## 2022-06-06 DIAGNOSIS — R059 Cough, unspecified: Secondary | ICD-10-CM | POA: Diagnosis not present

## 2022-06-07 DIAGNOSIS — E662 Morbid (severe) obesity with alveolar hypoventilation: Secondary | ICD-10-CM | POA: Diagnosis not present

## 2022-06-07 DIAGNOSIS — G4089 Other seizures: Secondary | ICD-10-CM | POA: Diagnosis not present

## 2022-06-07 DIAGNOSIS — E7849 Other hyperlipidemia: Secondary | ICD-10-CM | POA: Diagnosis not present

## 2022-06-07 DIAGNOSIS — Z79899 Other long term (current) drug therapy: Secondary | ICD-10-CM | POA: Diagnosis not present

## 2022-06-07 DIAGNOSIS — E1122 Type 2 diabetes mellitus with diabetic chronic kidney disease: Secondary | ICD-10-CM | POA: Diagnosis not present

## 2022-06-08 ENCOUNTER — Non-Acute Institutional Stay: Payer: Medicare HMO | Admitting: Nurse Practitioner

## 2022-06-08 VITALS — BP 120/76 | HR 63 | Temp 98.1°F | Resp 18 | Wt 222.3 lb

## 2022-06-08 DIAGNOSIS — R5381 Other malaise: Secondary | ICD-10-CM

## 2022-06-08 DIAGNOSIS — Z515 Encounter for palliative care: Secondary | ICD-10-CM

## 2022-06-08 DIAGNOSIS — I509 Heart failure, unspecified: Secondary | ICD-10-CM | POA: Diagnosis not present

## 2022-06-08 DIAGNOSIS — R0602 Shortness of breath: Secondary | ICD-10-CM | POA: Diagnosis not present

## 2022-06-08 NOTE — Progress Notes (Addendum)
Therapist, nutritional Palliative Care Consult Note Telephone: [phone]  Fax: [phone]    Date of encounter: 06/08/22 5:09 PM PATIENT NAME: Katherine [name] [address] [place] [address]   [phone] (home)  DOB: August 18, 1940 MRN: [id number] PRIMARY CARE PROVIDER:    Care, [facility],  [address] [place] [address] [phone]  RESPONSIBLE PARTY:    Contact Information     Name Relation Home Work [place] Spouse [phone]  [phone]   Katherine [name], Katherine [name] Daughter [phone]  [phone]         I met face to face with patient in facility. Palliative Care was asked to follow this patient by consultation request of  Care, [facility] to address advance care planning and complex medical decision making. This is a follow up visit.                                  ASSESSMENT AND PLAN / RECOMMENDATIONS:  Symptom Management/Plan: 1. ACP: remains a full code; most form in ACP   2. Edema stable; secondary to CHF, monitor weights, elevate; discussed nutrition with Katherine [name]   3. Shortness of breath secondary to CHF, intermit symptoms;  continue diuresing, inhalation therapy; Continuous O2,  Continue daily weights;    01/13/2022 weight 233 lbs 03/08/2022 weight 215.2 lbs 03/20/2022 weight 216.5 lbs 05/16/2022 weight 222.3 lbs 4. Debility; discussed with Katherine [name] about getting oob which she declines.  5. Palliative care encounter; Palliative medicine team will continue to support patient, patient's family, and medical team. Visit consisted of counseling and education dealing with the complex and emotionally intense issues of symptom management and palliative care in the setting of serious and potentially life-threatening illness   Follow up Palliative Care Visit: Palliative care will continue to follow for complex medical decision making, advance care planning, and clarification of goals. Return 4 to 8 weeks or prn.    I spent 47 minutes providing this consultation. More than 50% of the time in this consultation was spent in counseling and care coordination. PPS: 30%   Chief Complaint: Follow up palliative consult for complex medical decision making   HISTORY OF PRESENT ILLNESS:  Katherine [name] is a 82 y.o. 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 Katherine [name] [facility]. Katherine [name] remains bed bound, require staff to assist her with turning, positioning, bathing, dressing. Katherine [name] does feed herself after tray setup. Katherine [name] has been feeding herself per staff. At present Katherine [name] is lying in bed, declines getting oob. Appears comfortable. Katherine [name] endorses she continues to have intermit cough, "I just can't get it up". We talked about how she has been feeling, ros, encouraged to get oob, declined. We talked about what brings her joy, quality of life. We talked about no sob, or pain. Katherine [name] does sleep a lot. Limited with cognitive impairment. Katherine [name] was cooperative with assessment. Attempted to contact Katherine [name], message left. Medications, goc, poc reviewed. PC f/u  visit further discussion monitor trends of appetite, weights, monitor for functional, cognitive decline with chronic disease progression, assess any active symptoms, supportive role.   [name]  History obtained from review of EMR, discussion with  primary team, and interview with family, facility staff/caregiver and/or Katherine [name].  I reviewed available labs, medications, imaging, studies and related documents from the EMR.  Records reviewed and summarized above.    Physical Exam: General: obese, debilitated, chronically ill female ENMT: oral mucous membranes moist CV: S1S2, RRR Pulmonary: decrease bases few scattered rhonchi MSK: bed-bound; non-ambulatory Neuro:  + generalized weakness,  + cognitive impairment Psych: flat affect, A and Oriented to self  Thank you for the opportunity to participate in the care of Katherine [name]. Please call our office at [phone] if we can be of additional assistance.   Enes Rokosz Prince Rome, NP

## 2022-06-19 DIAGNOSIS — M79601 Pain in right arm: Secondary | ICD-10-CM | POA: Diagnosis not present

## 2022-06-21 DIAGNOSIS — M1712 Unilateral primary osteoarthritis, left knee: Secondary | ICD-10-CM | POA: Diagnosis not present

## 2022-06-30 DIAGNOSIS — B351 Tinea unguium: Secondary | ICD-10-CM | POA: Diagnosis not present

## 2022-06-30 DIAGNOSIS — L84 Corns and callosities: Secondary | ICD-10-CM | POA: Diagnosis not present

## 2022-06-30 DIAGNOSIS — G40909 Epilepsy, unspecified, not intractable, without status epilepticus: Secondary | ICD-10-CM | POA: Diagnosis not present

## 2022-06-30 DIAGNOSIS — I739 Peripheral vascular disease, unspecified: Secondary | ICD-10-CM | POA: Diagnosis not present

## 2022-07-03 DIAGNOSIS — R262 Difficulty in walking, not elsewhere classified: Secondary | ICD-10-CM | POA: Diagnosis not present

## 2022-07-03 DIAGNOSIS — J449 Chronic obstructive pulmonary disease, unspecified: Secondary | ICD-10-CM | POA: Diagnosis not present

## 2022-07-03 DIAGNOSIS — I5032 Chronic diastolic (congestive) heart failure: Secondary | ICD-10-CM | POA: Diagnosis not present

## 2022-07-06 DIAGNOSIS — E1122 Type 2 diabetes mellitus with diabetic chronic kidney disease: Secondary | ICD-10-CM | POA: Diagnosis not present

## 2022-07-06 DIAGNOSIS — E7849 Other hyperlipidemia: Secondary | ICD-10-CM | POA: Diagnosis not present

## 2022-07-06 DIAGNOSIS — L22 Diaper dermatitis: Secondary | ICD-10-CM | POA: Diagnosis not present

## 2022-07-06 DIAGNOSIS — E662 Morbid (severe) obesity with alveolar hypoventilation: Secondary | ICD-10-CM | POA: Diagnosis not present

## 2022-07-06 DIAGNOSIS — E119 Type 2 diabetes mellitus without complications: Secondary | ICD-10-CM | POA: Diagnosis not present

## 2022-07-07 DIAGNOSIS — G40909 Epilepsy, unspecified, not intractable, without status epilepticus: Secondary | ICD-10-CM | POA: Diagnosis not present

## 2022-07-07 DIAGNOSIS — D649 Anemia, unspecified: Secondary | ICD-10-CM | POA: Diagnosis not present

## 2022-07-07 DIAGNOSIS — E119 Type 2 diabetes mellitus without complications: Secondary | ICD-10-CM | POA: Diagnosis not present

## 2022-07-09 IMAGING — DX DG CHEST 1V PORT
1 series · 1 of 1 positions shown · non-contrast
Comparison: September 25, 2019

CLINICAL DATA: Altered mental status.

EXAM:
PORTABLE CHEST 1 VIEW

[chest ap]
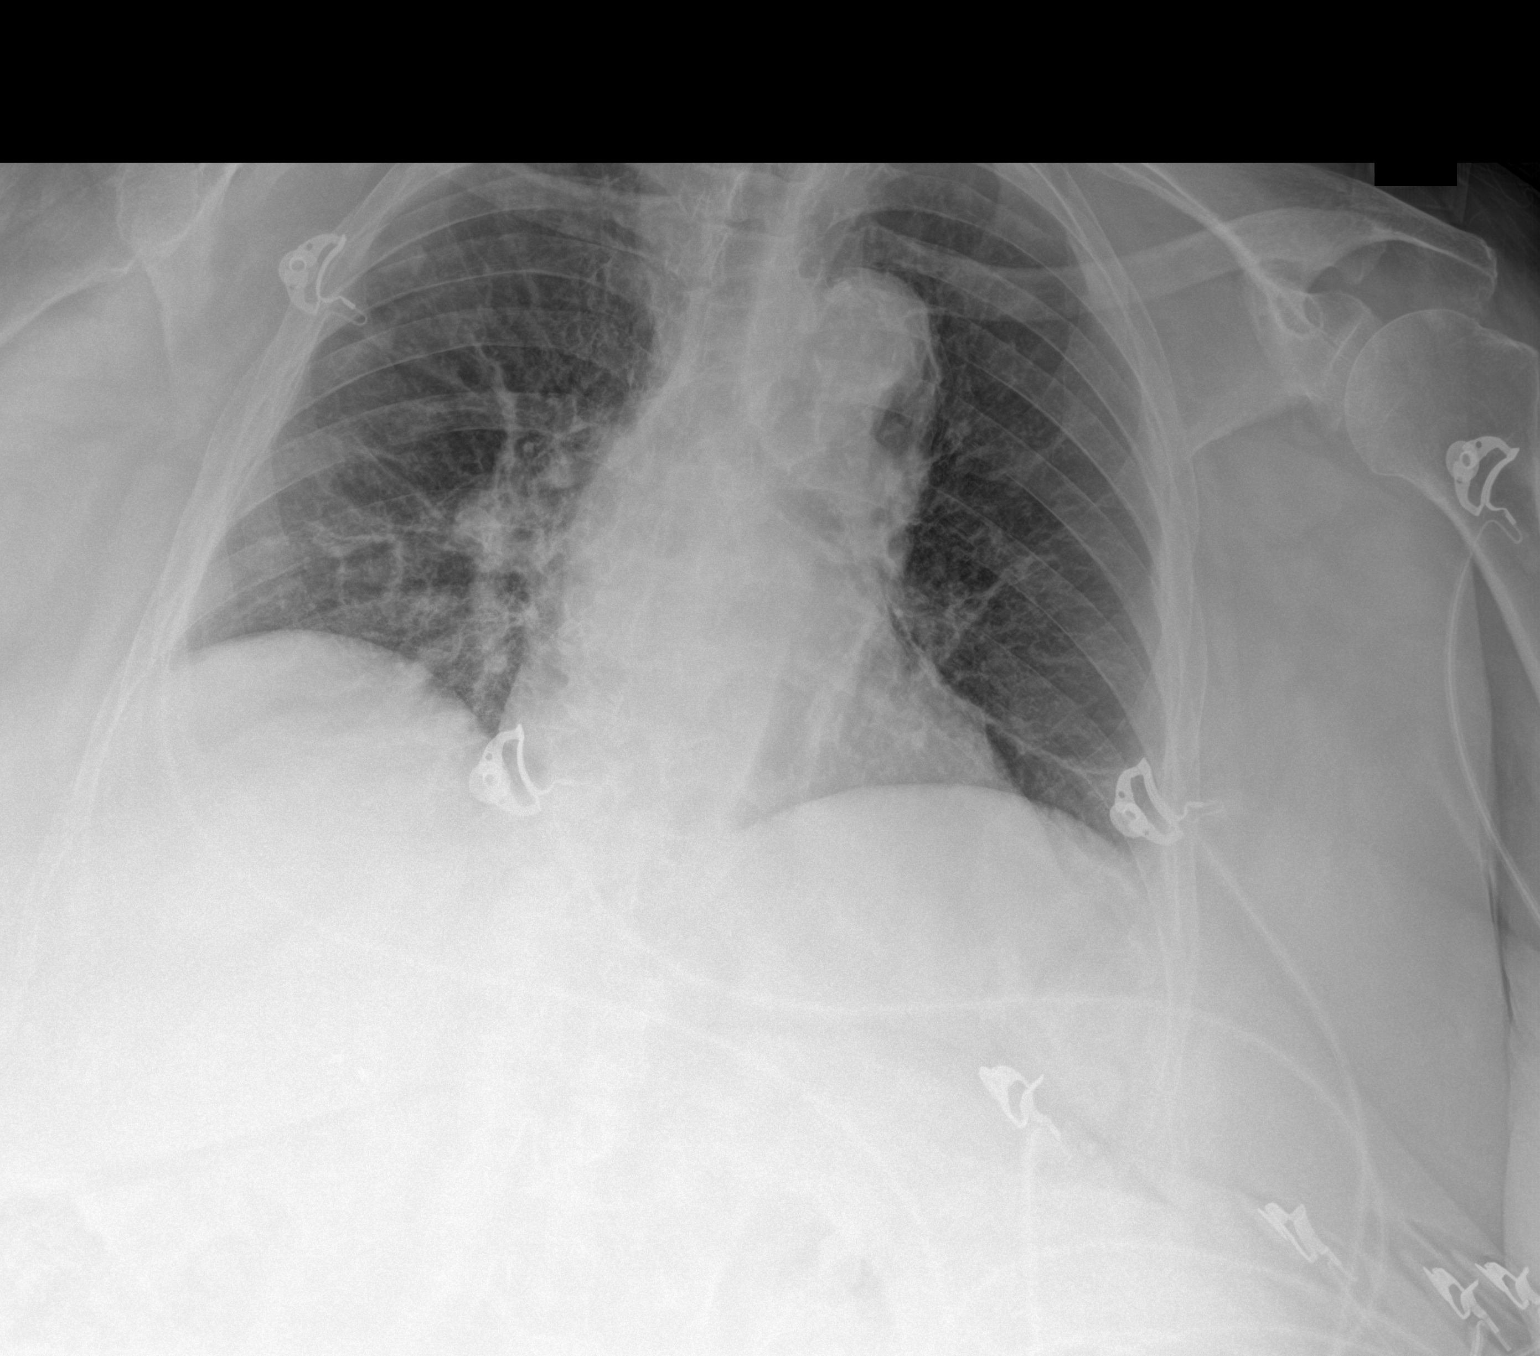

[1 of 1 positions shown; findings below may reference images not displayed]

FINDINGS: Cardiomediastinal silhouette is normal. Mediastinal contours appear
intact. Calcific atherosclerotic disease and tortuosity of the
aorta.

There is no evidence of focal airspace consolidation, pleural
effusion or pneumothorax. Streaky opacities in bilateral lower lobes
may represent scarring versus atelectasis.

Osseous structures are without acute abnormality. Soft tissues are
grossly normal.
IMPRESSION: Streaky opacities in bilateral lower lobes may represent scarring
versus atelectasis.

No acute findings.

## 2022-07-09 IMAGING — CT CT HEAD W/O CM
3 series · 15 of 44 positions shown, 18 images · non-contrast
Comparison: 03/02/2019

CLINICAL DATA: Mental status change.

EXAM:
CT HEAD WITHOUT CONTRAST
TECHNIQUE: Contiguous axial images were obtained from the base of the skull
through the vertex without intravenous contrast.

[Series 2: head wo · axial · 0.45mm/px · z∈[-108,+2]mm · 9 of 27 slices shown, 12 images]
[im 3/27  brain]
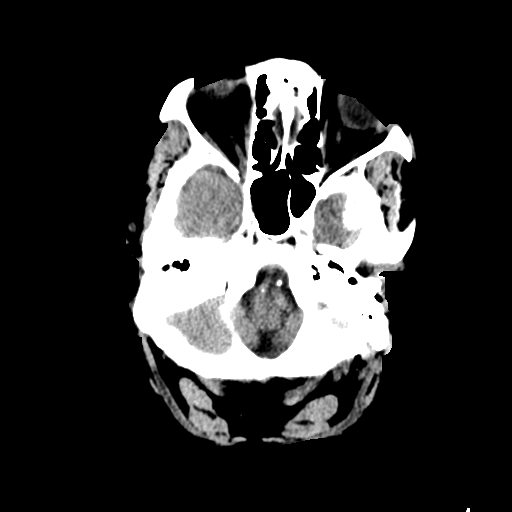
[im 3/27  bone]
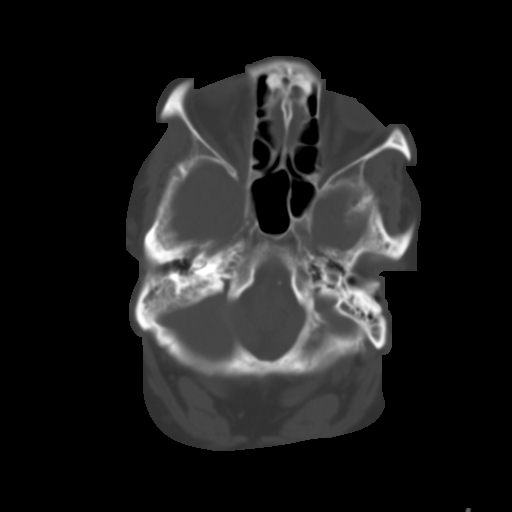
[im 6/27  brain]
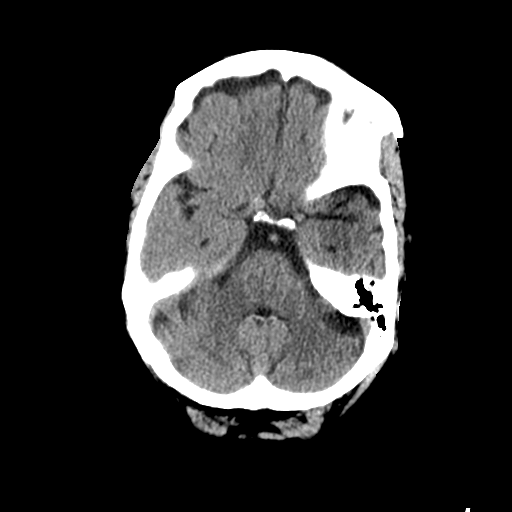
[im 8/27  brain]
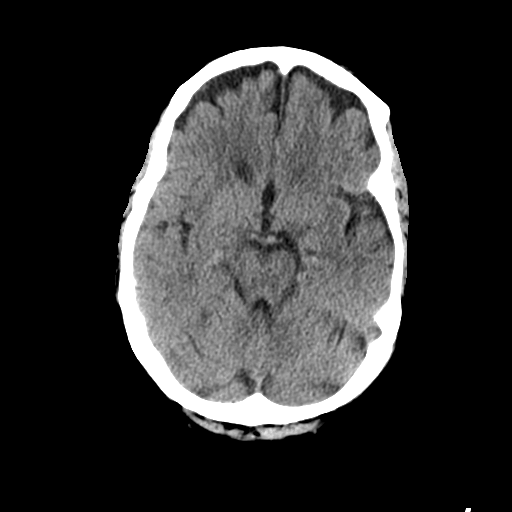
[im 11/27  brain]
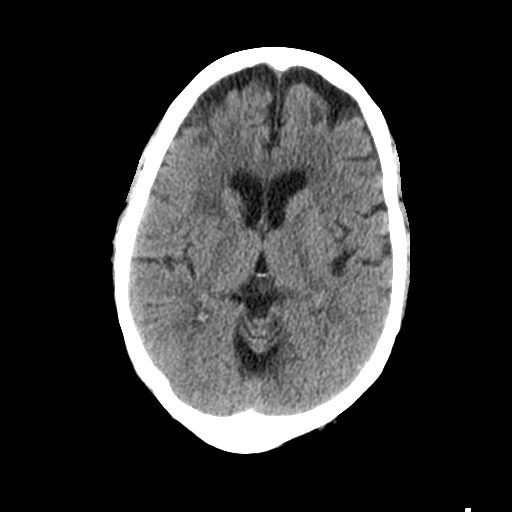
[im 14/27  brain]
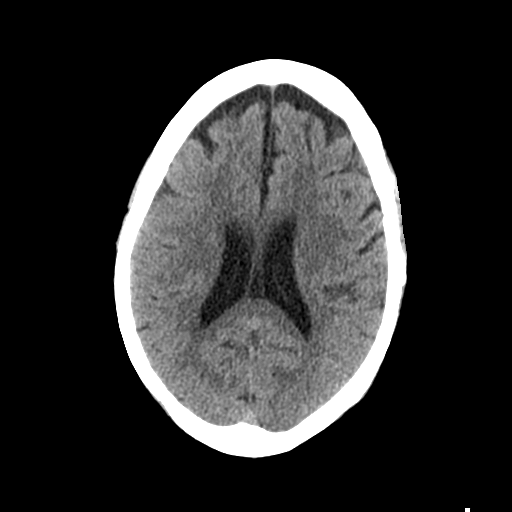
[im 14/27  bone]
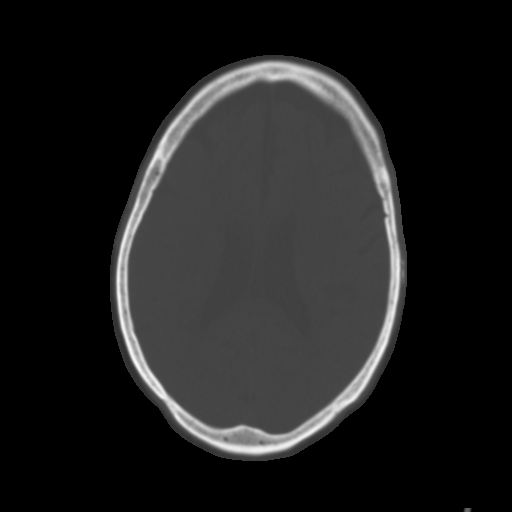
[im 17/27  brain]
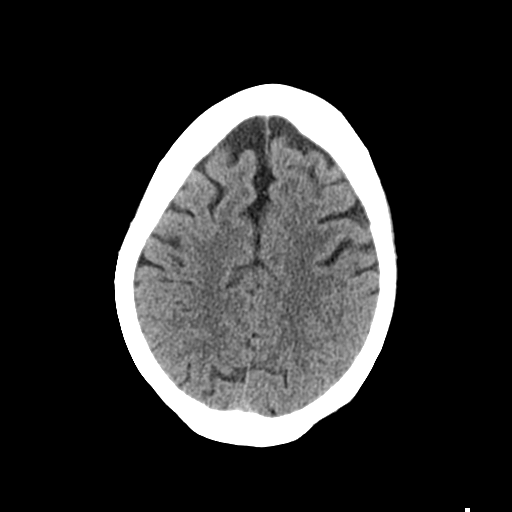
[im 20/27  brain]
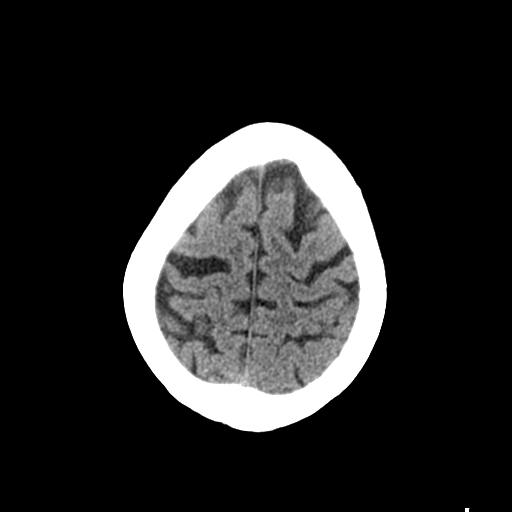
[im 22/27  brain]
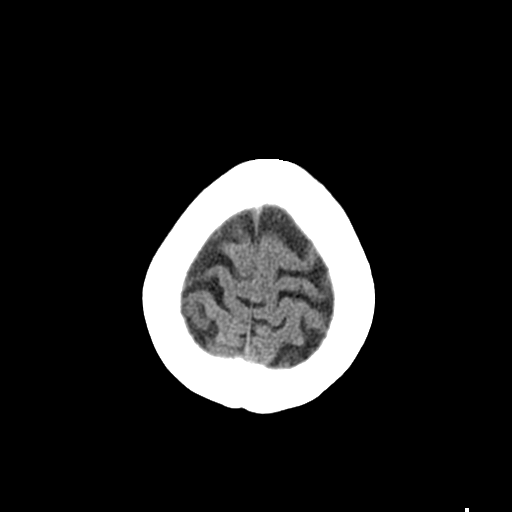
[im 25/27  brain]
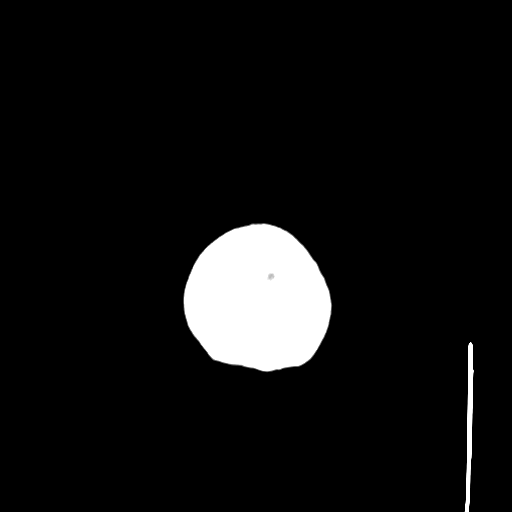
[im 25/27  bone]
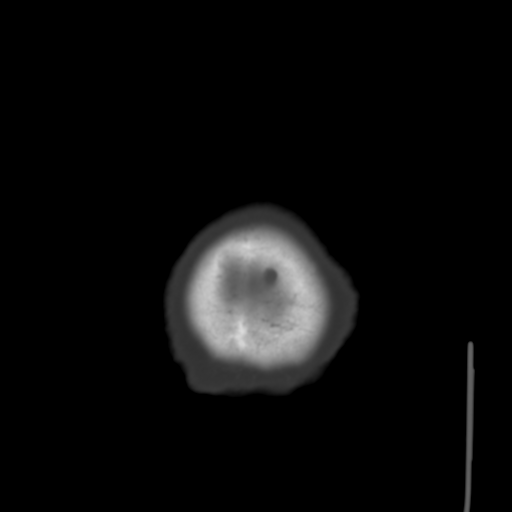

[Series 4: coronal soft tissue · coronal · 0.27mm/px · 3 of 65 slices shown]
[im 22/65  brain]
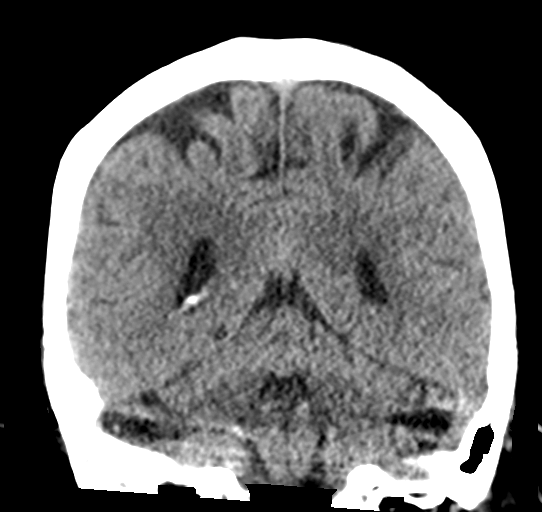
[im 29/65  brain]
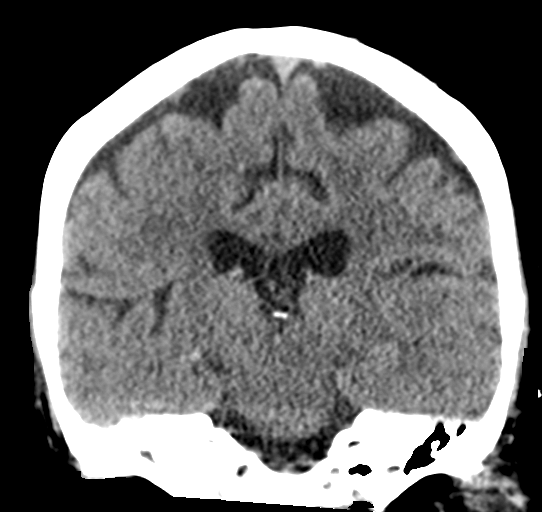
[im 36/65  brain]
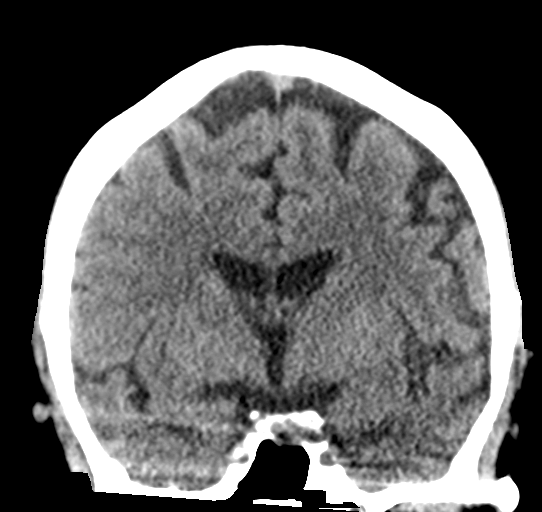

[Series 5: sagittal soft tissue · sagittal · 0.27mm/px · 3 of 49 slices shown]
[im 17/49  brain]
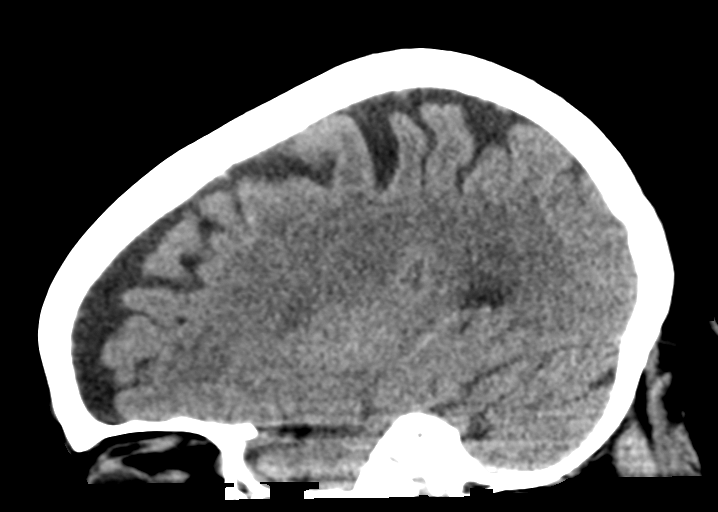
[im 25/49  brain]
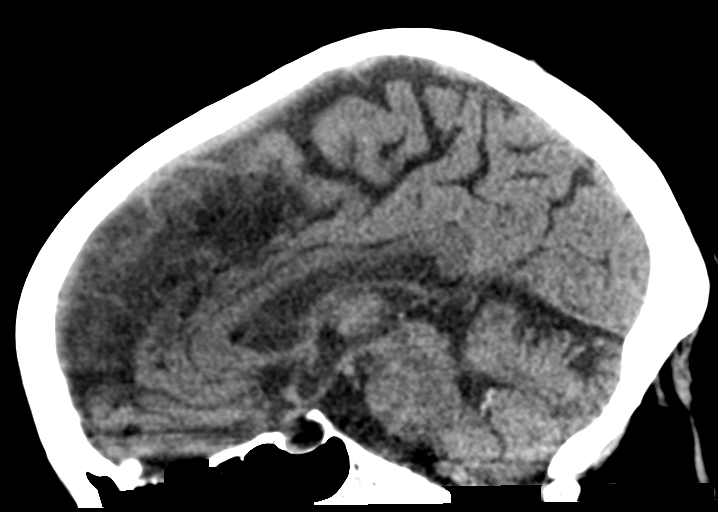
[im 33/49  brain]
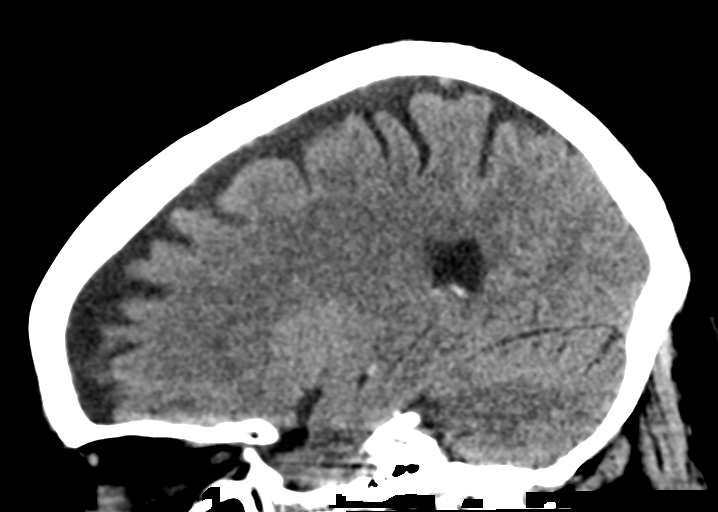

[15 of 44 positions shown; findings below may reference images not displayed]

FINDINGS: Brain: There is no evidence for acute hemorrhage, hydrocephalus,
mass lesion, or abnormal extra-axial fluid collection. No definite
CT evidence for acute infarction. Diffuse loss of parenchymal volume
is consistent with atrophy. Patchy low attenuation in the deep
hemispheric and periventricular white matter is nonspecific, but
likely reflects chronic microvascular ischemic demyelination.

Vascular: No hyperdense vessel or unexpected calcification.

Skull: No evidence for fracture. No worrisome lytic or sclerotic
lesion.

Sinuses/Orbits: Right mastoid effusion again noted. Visualized
paranasal sinuses are clear. Visualized portions of the globes and
intraorbital fat are unremarkable.

Other: None.
IMPRESSION: 1. No acute intracranial abnormality.
2. Atrophy with chronic small vessel white matter ischemic disease.
3. Right mastoid effusion, stable.

## 2022-07-13 DIAGNOSIS — E7849 Other hyperlipidemia: Secondary | ICD-10-CM | POA: Diagnosis not present

## 2022-07-13 DIAGNOSIS — E662 Morbid (severe) obesity with alveolar hypoventilation: Secondary | ICD-10-CM | POA: Diagnosis not present

## 2022-07-13 DIAGNOSIS — E1122 Type 2 diabetes mellitus with diabetic chronic kidney disease: Secondary | ICD-10-CM | POA: Diagnosis not present

## 2022-07-13 DIAGNOSIS — L22 Diaper dermatitis: Secondary | ICD-10-CM | POA: Diagnosis not present

## 2022-07-18 DIAGNOSIS — J449 Chronic obstructive pulmonary disease, unspecified: Secondary | ICD-10-CM | POA: Diagnosis not present

## 2022-07-18 DIAGNOSIS — N183 Chronic kidney disease, stage 3 unspecified: Secondary | ICD-10-CM | POA: Diagnosis not present

## 2022-07-18 DIAGNOSIS — G40909 Epilepsy, unspecified, not intractable, without status epilepticus: Secondary | ICD-10-CM | POA: Diagnosis not present

## 2022-07-18 DIAGNOSIS — I10 Essential (primary) hypertension: Secondary | ICD-10-CM | POA: Diagnosis not present

## 2022-07-18 DIAGNOSIS — J810 Acute pulmonary edema: Secondary | ICD-10-CM | POA: Diagnosis not present

## 2022-07-18 DIAGNOSIS — N184 Chronic kidney disease, stage 4 (severe): Secondary | ICD-10-CM | POA: Diagnosis not present

## 2022-07-18 DIAGNOSIS — J9611 Chronic respiratory failure with hypoxia: Secondary | ICD-10-CM | POA: Diagnosis not present

## 2022-07-18 DIAGNOSIS — I2510 Atherosclerotic heart disease of native coronary artery without angina pectoris: Secondary | ICD-10-CM | POA: Diagnosis not present

## 2022-07-18 DIAGNOSIS — J9612 Chronic respiratory failure with hypercapnia: Secondary | ICD-10-CM | POA: Diagnosis not present

## 2022-07-19 DIAGNOSIS — J449 Chronic obstructive pulmonary disease, unspecified: Secondary | ICD-10-CM | POA: Diagnosis not present

## 2022-07-19 DIAGNOSIS — R4701 Aphasia: Secondary | ICD-10-CM | POA: Diagnosis not present

## 2022-07-19 DIAGNOSIS — I2510 Atherosclerotic heart disease of native coronary artery without angina pectoris: Secondary | ICD-10-CM | POA: Diagnosis not present

## 2022-07-19 DIAGNOSIS — J9612 Chronic respiratory failure with hypercapnia: Secondary | ICD-10-CM | POA: Diagnosis not present

## 2022-07-19 DIAGNOSIS — R600 Localized edema: Secondary | ICD-10-CM | POA: Diagnosis not present

## 2022-07-19 DIAGNOSIS — I10 Essential (primary) hypertension: Secondary | ICD-10-CM | POA: Diagnosis not present

## 2022-07-19 DIAGNOSIS — J9611 Chronic respiratory failure with hypoxia: Secondary | ICD-10-CM | POA: Diagnosis not present

## 2022-07-20 DIAGNOSIS — E1122 Type 2 diabetes mellitus with diabetic chronic kidney disease: Secondary | ICD-10-CM | POA: Diagnosis not present

## 2022-07-20 DIAGNOSIS — L22 Diaper dermatitis: Secondary | ICD-10-CM | POA: Diagnosis not present

## 2022-07-20 DIAGNOSIS — E662 Morbid (severe) obesity with alveolar hypoventilation: Secondary | ICD-10-CM | POA: Diagnosis not present

## 2022-07-20 DIAGNOSIS — E7849 Other hyperlipidemia: Secondary | ICD-10-CM | POA: Diagnosis not present

## 2022-07-20 DIAGNOSIS — Z79899 Other long term (current) drug therapy: Secondary | ICD-10-CM | POA: Diagnosis not present

## 2022-07-21 DIAGNOSIS — E118 Type 2 diabetes mellitus with unspecified complications: Secondary | ICD-10-CM | POA: Diagnosis not present

## 2022-07-21 DIAGNOSIS — E210 Primary hyperparathyroidism: Secondary | ICD-10-CM | POA: Diagnosis not present

## 2022-07-21 DIAGNOSIS — E119 Type 2 diabetes mellitus without complications: Secondary | ICD-10-CM | POA: Diagnosis not present

## 2022-07-21 DIAGNOSIS — E559 Vitamin D deficiency, unspecified: Secondary | ICD-10-CM | POA: Diagnosis not present

## 2022-07-21 DIAGNOSIS — I10 Essential (primary) hypertension: Secondary | ICD-10-CM | POA: Diagnosis not present

## 2022-07-24 DIAGNOSIS — J9601 Acute respiratory failure with hypoxia: Secondary | ICD-10-CM | POA: Diagnosis not present

## 2022-07-24 DIAGNOSIS — J189 Pneumonia, unspecified organism: Secondary | ICD-10-CM | POA: Diagnosis not present

## 2022-07-24 DIAGNOSIS — E118 Type 2 diabetes mellitus with unspecified complications: Secondary | ICD-10-CM | POA: Diagnosis not present

## 2022-07-24 DIAGNOSIS — E119 Type 2 diabetes mellitus without complications: Secondary | ICD-10-CM | POA: Diagnosis not present

## 2022-07-24 DIAGNOSIS — D638 Anemia in other chronic diseases classified elsewhere: Secondary | ICD-10-CM | POA: Diagnosis not present

## 2022-07-24 DIAGNOSIS — N184 Chronic kidney disease, stage 4 (severe): Secondary | ICD-10-CM | POA: Diagnosis not present

## 2022-07-24 DIAGNOSIS — Z741 Need for assistance with personal care: Secondary | ICD-10-CM | POA: Diagnosis not present

## 2022-07-25 DIAGNOSIS — R0902 Hypoxemia: Secondary | ICD-10-CM | POA: Diagnosis not present

## 2022-07-25 DIAGNOSIS — R0602 Shortness of breath: Secondary | ICD-10-CM | POA: Diagnosis not present

## 2022-07-26 DIAGNOSIS — J449 Chronic obstructive pulmonary disease, unspecified: Secondary | ICD-10-CM | POA: Diagnosis not present

## 2022-07-26 DIAGNOSIS — J189 Pneumonia, unspecified organism: Secondary | ICD-10-CM | POA: Diagnosis not present

## 2022-07-27 DIAGNOSIS — E7849 Other hyperlipidemia: Secondary | ICD-10-CM | POA: Diagnosis not present

## 2022-07-27 DIAGNOSIS — E1122 Type 2 diabetes mellitus with diabetic chronic kidney disease: Secondary | ICD-10-CM | POA: Diagnosis not present

## 2022-07-27 DIAGNOSIS — E662 Morbid (severe) obesity with alveolar hypoventilation: Secondary | ICD-10-CM | POA: Diagnosis not present

## 2022-07-27 DIAGNOSIS — L22 Diaper dermatitis: Secondary | ICD-10-CM | POA: Diagnosis not present

## 2022-08-02 DIAGNOSIS — J189 Pneumonia, unspecified organism: Secondary | ICD-10-CM | POA: Diagnosis not present

## 2022-08-02 DIAGNOSIS — R079 Chest pain, unspecified: Secondary | ICD-10-CM | POA: Diagnosis not present

## 2022-08-03 ENCOUNTER — Non-Acute Institutional Stay: Payer: Medicare HMO | Admitting: Nurse Practitioner

## 2022-08-03 VITALS — BP 120/76 | HR 63 | Temp 98.1°F | Resp 18 | Wt 209.5 lb

## 2022-08-03 DIAGNOSIS — E7849 Other hyperlipidemia: Secondary | ICD-10-CM | POA: Diagnosis not present

## 2022-08-03 DIAGNOSIS — R5381 Other malaise: Secondary | ICD-10-CM

## 2022-08-03 DIAGNOSIS — R0602 Shortness of breath: Secondary | ICD-10-CM | POA: Diagnosis not present

## 2022-08-03 DIAGNOSIS — Z515 Encounter for palliative care: Secondary | ICD-10-CM | POA: Diagnosis not present

## 2022-08-03 DIAGNOSIS — I509 Heart failure, unspecified: Secondary | ICD-10-CM | POA: Diagnosis not present

## 2022-08-03 DIAGNOSIS — L22 Diaper dermatitis: Secondary | ICD-10-CM | POA: Diagnosis not present

## 2022-08-03 DIAGNOSIS — E662 Morbid (severe) obesity with alveolar hypoventilation: Secondary | ICD-10-CM | POA: Diagnosis not present

## 2022-08-03 DIAGNOSIS — E1122 Type 2 diabetes mellitus with diabetic chronic kidney disease: Secondary | ICD-10-CM | POA: Diagnosis not present

## 2022-08-03 NOTE — Progress Notes (Addendum)
Therapist, nutritional Palliative Care Consult Note Telephone: [phone]  Fax: [phone]    Date of encounter: 08/03/22 5:26 PM PATIENT NAME: Katherine [name] [address] [place] [address]   [phone] (home)  DOB: 1940/07/08 MRN: [id number] PRIMARY CARE PROVIDER:    Care, [facility],  [address] [place] [address] [phone]  RESPONSIBLE PARTY:    Contact Information     Name Relation Home Work [place] Spouse [phone]  [phone]   Katherine [name], Katherine [name] Daughter [phone]  [phone]             I met face to face with patient in facility. Palliative Care was asked to follow this patient by consultation request of  Care, [facility] to address advance care planning and complex medical decision making. This is a follow up visit.                                  ASSESSMENT AND PLAN / RECOMMENDATIONS:  Symptom Management/Plan: 1. ACP: remains a full code; most form in ACP   2. Edema stable; secondary to CHF, monitor weights, elevate; discussed nutrition with Mr. Katherine [name]   3. Shortness of breath secondary to CHF, intermit symptoms;  continue diuresing, inhalation therapy; Continuous O2,  Continue daily weights;   03/20/2022 weight 216.5 lbs 05/16/2022 weight 222.3 lbs 07/17/2022 weight 209.5 lbs  4. Debility; discussed with Katherine [name] about getting oob which she declines.  5. Palliative care encounter; Palliative medicine team will continue to support patient, patient's family, and medical team. Visit consisted of counseling and education dealing with the complex and emotionally intense issues of symptom management and palliative care in the setting of serious and potentially life-threatening illness   Follow up Palliative Care Visit: Palliative care will continue to follow for complex medical decision making, advance care planning, and clarification of goals. Return 2 to 8 weeks or prn.   I spent 45 minutes  providing this consultation. More than 50% of the time in this consultation was spent in counseling and care coordination. PPS: 30%   Chief Complaint: Follow up palliative consult for complex medical decision making   HISTORY OF PRESENT ILLNESS:  Katherine [name] is a 82 y.o. 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 Katherine [name] [facility]. Katherine [name] remains bed bound, require staff to assist her with turning, positioning, bathing, dressing. Katherine [name] does feed herself after tray setup. Katherine [name] has been feeding herself per staff. At present Katherine [name] is lying in bed, appears comfortable, quiet. We talked about ros, functional abilities and getting oob which Katherine [name] has declined. Limited discussion with cognitive impairment and Katherine [name] being quiet today. Katherine [name] was cooperative with assessment. Seen by [facility] 07/19/2022 for CKD stage 3 with creatinine 1.5 to 1.9 with no changes. Attempted to contact Mr [name], message left. Medications, goc, poc reviewed. PC f/u visit further discussion monitor trends of appetite, weights, monitor for functional, cognitive decline with chronic disease  progression, assess any active symptoms, supportive role.   [name]  History obtained from review of EMR, discussion with primary team, and interview with family, facility staff/caregiver and/or Katherine [name].  I  reviewed available labs, medications, imaging, studies and related documents from the EMR.  Records reviewed and summarized above.    Physical Exam: General: obese, debilitated, chronically ill female ENMT: oral mucous membranes moist CV: S1S2, RRR Pulmonary: decrease bases few scattered rhonchi MSK: bed-bound; non-ambulatory Neuro:  + generalized weakness,  + cognitive impairment Psych: flat affect, A and Oriented to self  Thank you for the opportunity to participate in the care of Katherine [name]. Please call our office at [phone] if we can be of additional assistance.   Katherine Zurn Prince Rome, NP

## 2022-08-07 DIAGNOSIS — Z5320 Procedure and treatment not carried out because of patient's decision for unspecified reasons: Secondary | ICD-10-CM | POA: Diagnosis not present

## 2022-08-07 DIAGNOSIS — J449 Chronic obstructive pulmonary disease, unspecified: Secondary | ICD-10-CM | POA: Diagnosis not present

## 2022-08-08 DIAGNOSIS — L98412 Non-pressure chronic ulcer of buttock with fat layer exposed: Secondary | ICD-10-CM | POA: Diagnosis not present

## 2022-08-08 DIAGNOSIS — L98492 Non-pressure chronic ulcer of skin of other sites with fat layer exposed: Secondary | ICD-10-CM | POA: Diagnosis not present

## 2022-08-10 DIAGNOSIS — L22 Diaper dermatitis: Secondary | ICD-10-CM | POA: Diagnosis not present

## 2022-08-10 DIAGNOSIS — E1122 Type 2 diabetes mellitus with diabetic chronic kidney disease: Secondary | ICD-10-CM | POA: Diagnosis not present

## 2022-08-10 DIAGNOSIS — E7849 Other hyperlipidemia: Secondary | ICD-10-CM | POA: Diagnosis not present

## 2022-08-10 DIAGNOSIS — E662 Morbid (severe) obesity with alveolar hypoventilation: Secondary | ICD-10-CM | POA: Diagnosis not present

## 2022-08-17 DIAGNOSIS — E662 Morbid (severe) obesity with alveolar hypoventilation: Secondary | ICD-10-CM | POA: Diagnosis not present

## 2022-08-17 DIAGNOSIS — E1122 Type 2 diabetes mellitus with diabetic chronic kidney disease: Secondary | ICD-10-CM | POA: Diagnosis not present

## 2022-08-17 DIAGNOSIS — E7849 Other hyperlipidemia: Secondary | ICD-10-CM | POA: Diagnosis not present

## 2022-08-17 DIAGNOSIS — L22 Diaper dermatitis: Secondary | ICD-10-CM | POA: Diagnosis not present

## 2022-08-21 DIAGNOSIS — L98412 Non-pressure chronic ulcer of buttock with fat layer exposed: Secondary | ICD-10-CM | POA: Diagnosis not present

## 2022-08-21 DIAGNOSIS — L98492 Non-pressure chronic ulcer of skin of other sites with fat layer exposed: Secondary | ICD-10-CM | POA: Diagnosis not present

## 2022-08-25 DIAGNOSIS — L22 Diaper dermatitis: Secondary | ICD-10-CM | POA: Diagnosis not present

## 2022-08-25 DIAGNOSIS — E662 Morbid (severe) obesity with alveolar hypoventilation: Secondary | ICD-10-CM | POA: Diagnosis not present

## 2022-08-25 DIAGNOSIS — E7849 Other hyperlipidemia: Secondary | ICD-10-CM | POA: Diagnosis not present

## 2022-08-25 DIAGNOSIS — E1122 Type 2 diabetes mellitus with diabetic chronic kidney disease: Secondary | ICD-10-CM | POA: Diagnosis not present

## 2022-08-30 DIAGNOSIS — E119 Type 2 diabetes mellitus without complications: Secondary | ICD-10-CM | POA: Diagnosis not present

## 2022-08-30 DIAGNOSIS — Z5320 Procedure and treatment not carried out because of patient's decision for unspecified reasons: Secondary | ICD-10-CM | POA: Diagnosis not present

## 2022-08-31 DIAGNOSIS — L22 Diaper dermatitis: Secondary | ICD-10-CM | POA: Diagnosis not present

## 2022-08-31 DIAGNOSIS — E662 Morbid (severe) obesity with alveolar hypoventilation: Secondary | ICD-10-CM | POA: Diagnosis not present

## 2022-08-31 DIAGNOSIS — E7849 Other hyperlipidemia: Secondary | ICD-10-CM | POA: Diagnosis not present

## 2022-08-31 DIAGNOSIS — E1122 Type 2 diabetes mellitus with diabetic chronic kidney disease: Secondary | ICD-10-CM | POA: Diagnosis not present

## 2022-09-04 DIAGNOSIS — L98412 Non-pressure chronic ulcer of buttock with fat layer exposed: Secondary | ICD-10-CM | POA: Diagnosis not present

## 2022-09-04 DIAGNOSIS — L97112 Non-pressure chronic ulcer of right thigh with fat layer exposed: Secondary | ICD-10-CM | POA: Diagnosis not present

## 2022-09-04 DIAGNOSIS — L98492 Non-pressure chronic ulcer of skin of other sites with fat layer exposed: Secondary | ICD-10-CM | POA: Diagnosis not present

## 2022-09-06 DIAGNOSIS — Z0189 Encounter for other specified special examinations: Secondary | ICD-10-CM | POA: Diagnosis not present

## 2022-09-06 DIAGNOSIS — Z79899 Other long term (current) drug therapy: Secondary | ICD-10-CM | POA: Diagnosis not present

## 2022-09-06 DIAGNOSIS — G40001 Localization-related (focal) (partial) idiopathic epilepsy and epileptic syndromes with seizures of localized onset, not intractable, with status epilepticus: Secondary | ICD-10-CM | POA: Diagnosis not present

## 2022-09-06 DIAGNOSIS — R569 Unspecified convulsions: Secondary | ICD-10-CM | POA: Diagnosis not present

## 2022-09-07 DIAGNOSIS — E1122 Type 2 diabetes mellitus with diabetic chronic kidney disease: Secondary | ICD-10-CM | POA: Diagnosis not present

## 2022-09-07 DIAGNOSIS — E662 Morbid (severe) obesity with alveolar hypoventilation: Secondary | ICD-10-CM | POA: Diagnosis not present

## 2022-09-07 DIAGNOSIS — E7849 Other hyperlipidemia: Secondary | ICD-10-CM | POA: Diagnosis not present

## 2022-09-07 DIAGNOSIS — L22 Diaper dermatitis: Secondary | ICD-10-CM | POA: Diagnosis not present

## 2022-09-14 DIAGNOSIS — I5032 Chronic diastolic (congestive) heart failure: Secondary | ICD-10-CM | POA: Diagnosis not present

## 2022-09-14 DIAGNOSIS — E1122 Type 2 diabetes mellitus with diabetic chronic kidney disease: Secondary | ICD-10-CM | POA: Diagnosis not present

## 2022-09-14 DIAGNOSIS — L22 Diaper dermatitis: Secondary | ICD-10-CM | POA: Diagnosis not present

## 2022-09-14 DIAGNOSIS — E7849 Other hyperlipidemia: Secondary | ICD-10-CM | POA: Diagnosis not present

## 2022-09-14 DIAGNOSIS — E662 Morbid (severe) obesity with alveolar hypoventilation: Secondary | ICD-10-CM | POA: Diagnosis not present

## 2022-09-15 ENCOUNTER — Emergency Department
Admission: EM | Admit: 2022-09-15 | Discharge: 2022-10-15 | Disposition: E | Payer: Medicare HMO | Attending: Emergency Medicine | Admitting: Emergency Medicine

## 2022-09-15 ENCOUNTER — Emergency Department: Payer: Medicare HMO

## 2022-09-15 ENCOUNTER — Other Ambulatory Visit: Payer: Self-pay

## 2022-09-15 DIAGNOSIS — R4182 Altered mental status, unspecified: Secondary | ICD-10-CM | POA: Diagnosis not present

## 2022-09-15 DIAGNOSIS — I4430 Unspecified atrioventricular block: Secondary | ICD-10-CM | POA: Diagnosis not present

## 2022-09-15 DIAGNOSIS — N179 Acute kidney failure, unspecified: Secondary | ICD-10-CM | POA: Insufficient documentation

## 2022-09-15 DIAGNOSIS — R918 Other nonspecific abnormal finding of lung field: Secondary | ICD-10-CM | POA: Diagnosis not present

## 2022-09-15 DIAGNOSIS — I4892 Unspecified atrial flutter: Secondary | ICD-10-CM | POA: Insufficient documentation

## 2022-09-15 DIAGNOSIS — R5383 Other fatigue: Secondary | ICD-10-CM | POA: Diagnosis present

## 2022-09-15 DIAGNOSIS — N390 Urinary tract infection, site not specified: Secondary | ICD-10-CM | POA: Diagnosis not present

## 2022-09-15 DIAGNOSIS — R579 Shock, unspecified: Secondary | ICD-10-CM | POA: Insufficient documentation

## 2022-09-15 DIAGNOSIS — I10 Essential (primary) hypertension: Secondary | ICD-10-CM | POA: Diagnosis not present

## 2022-09-15 DIAGNOSIS — I700 Atherosclerosis of aorta: Secondary | ICD-10-CM | POA: Diagnosis not present

## 2022-09-15 DIAGNOSIS — I7143 Infrarenal abdominal aortic aneurysm, without rupture: Secondary | ICD-10-CM | POA: Diagnosis not present

## 2022-09-15 DIAGNOSIS — I639 Cerebral infarction, unspecified: Secondary | ICD-10-CM | POA: Diagnosis not present

## 2022-09-15 DIAGNOSIS — D3502 Benign neoplasm of left adrenal gland: Secondary | ICD-10-CM | POA: Diagnosis not present

## 2022-09-15 DIAGNOSIS — I6389 Other cerebral infarction: Secondary | ICD-10-CM | POA: Diagnosis not present

## 2022-09-15 DIAGNOSIS — N200 Calculus of kidney: Secondary | ICD-10-CM | POA: Diagnosis not present

## 2022-09-15 LAB — CBC WITH DIFFERENTIAL/PLATELET
Abs Immature Granulocytes: 0.14 10*3/uL — ABNORMAL HIGH (ref 0.00–0.07)
Basophils Absolute: 0 10*3/uL (ref 0.0–0.1)
Basophils Relative: 0 %
Eosinophils Absolute: 0 10*3/uL (ref 0.0–0.5)
Eosinophils Relative: 0 %
HCT: 30.1 % — ABNORMAL LOW (ref 36.0–46.0)
Hemoglobin: 9.3 g/dL — ABNORMAL LOW (ref 12.0–15.0)
Immature Granulocytes: 1 %
Lymphocytes Relative: 9 %
Lymphs Abs: 1.7 10*3/uL (ref 0.7–4.0)
MCH: 28.9 pg (ref 26.0–34.0)
MCHC: 30.9 g/dL (ref 30.0–36.0)
MCV: 93.5 fL (ref 80.0–100.0)
Monocytes Absolute: 0.7 10*3/uL (ref 0.1–1.0)
Monocytes Relative: 4 %
Neutro Abs: 15.5 10*3/uL — ABNORMAL HIGH (ref 1.7–7.7)
Neutrophils Relative %: 86 %
Platelets: 127 10*3/uL — ABNORMAL LOW (ref 150–400)
RBC: 3.22 MIL/uL — ABNORMAL LOW (ref 3.87–5.11)
RDW: 14 % (ref 11.5–15.5)
WBC: 18.1 10*3/uL — ABNORMAL HIGH (ref 4.0–10.5)
nRBC: 0.8 % — ABNORMAL HIGH (ref 0.0–0.2)

## 2022-09-15 LAB — COMPREHENSIVE METABOLIC PANEL
ALT: 12 U/L (ref 0–44)
AST: 18 U/L (ref 15–41)
Albumin: 2 g/dL — ABNORMAL LOW (ref 3.5–5.0)
Alkaline Phosphatase: 73 U/L (ref 38–126)
Anion gap: 14 (ref 5–15)
BUN: 100 mg/dL — ABNORMAL HIGH (ref 8–23)
CO2: 21 mmol/L — ABNORMAL LOW (ref 22–32)
Calcium: 8.6 mg/dL — ABNORMAL LOW (ref 8.9–10.3)
Chloride: 109 mmol/L (ref 98–111)
Creatinine, Ser: 4.93 mg/dL — ABNORMAL HIGH (ref 0.44–1.00)
GFR, Estimated: 8 mL/min — ABNORMAL LOW (ref >60)
Glucose, Bld: 262 mg/dL — ABNORMAL HIGH (ref 70–99)
Potassium: 4.3 mmol/L (ref 3.5–5.1)
Sodium: 144 mmol/L (ref 135–145)
Total Bilirubin: 0.5 mg/dL (ref 0.3–1.2)
Total Protein: 6.3 g/dL — ABNORMAL LOW (ref 6.5–8.1)

## 2022-09-15 LAB — APTT: aPTT: 29 seconds (ref 24–36)

## 2022-09-15 LAB — LACTIC ACID, PLASMA: Lactic Acid, Venous: 3.8 mmol/L (ref 0.5–1.9)

## 2022-09-15 LAB — PROTIME-INR
INR: 1.2 (ref 0.8–1.2)
Prothrombin Time: 15.7 seconds — ABNORMAL HIGH (ref 11.4–15.2)

## 2022-09-15 LAB — TROPONIN I (HIGH SENSITIVITY): Troponin I (High Sensitivity): 265 ng/L (ref <18)

## 2022-09-15 LAB — CBG MONITORING, ED
Glucose-Capillary: 56 mg/dL — ABNORMAL LOW (ref 70–99)
Glucose-Capillary: 167 mg/dL — ABNORMAL HIGH (ref 70–99)

## 2022-09-15 MED ORDER — DEXTROSE 50 % IV SOLN
1.0000 | Freq: Once | INTRAVENOUS | Status: AC
  Administered 2022-09-15: 50 mL via INTRAVENOUS
  Filled 2022-09-15: qty 50

## 2022-09-15 MED ORDER — LACTATED RINGERS IV BOLUS
500.0000 mL | Freq: Once | INTRAVENOUS | Status: AC
  Administered 2022-09-15: 500 mL via INTRAVENOUS

## 2022-09-15 MED ORDER — LACTATED RINGERS IV BOLUS
1000.0000 mL | Freq: Once | INTRAVENOUS | Status: AC
  Administered 2022-09-15: 1000 mL via INTRAVENOUS

## 2022-09-15 MED ORDER — SODIUM CHLORIDE 0.9 % IV SOLN
100.0000 mg | INTRAVENOUS | Status: DC
  Filled 2022-09-15: qty 10

## 2022-09-15 MED ORDER — LEVETIRACETAM IN NACL 1000 MG/100ML IV SOLN
1000.0000 mg | INTRAVENOUS | Status: AC
  Administered 2022-09-15: 1000 mg via INTRAVENOUS
  Filled 2022-09-15: qty 100

## 2022-09-15 MED ORDER — GLUCAGON HCL RDNA (DIAGNOSTIC) 1 MG IJ SOLR: 1.0000 mg | Freq: Once | INTRAMUSCULAR | Status: DC

## 2022-09-15 MED ORDER — SODIUM CHLORIDE 0.9 % IV SOLN
1.0000 g | INTRAVENOUS | Status: AC
  Administered 2022-09-15: 1 g via INTRAVENOUS
  Filled 2022-09-15: qty 10

## 2022-09-15 NOTE — ED Notes (Signed)
Per MD, do not in and out cath pt as pt is actively dying.

## 2022-09-15 NOTE — ED Provider Notes (Signed)
Assumed care from Dr. Fanny Bien at 11 PM. Briefly, the patient is a 82 y.o. female with PMHx of  has a past medical history of Acute encephalopathy (02/11/2019), Acute on chronic anemia (01/03/2019), Acute on chronic congestive heart failure (HCC), Acute respiratory failure with hypoxia and hypercapnia (HCC) (04/19/2021), Angioedema, Anxiety and depression, Aphasia (06/28/2017), Cerebral atrophy (HCC) (06/19/2021), Chest pain (07/15/2018), CHF (congestive heart failure) (HCC), Chronic constipation, Chronic kidney disease, stage 4 (severe) (HCC) (06/19/2021), COPD (chronic obstructive pulmonary disease) (HCC), COPD exacerbation (HCC) (11/30/2017), COPD with acute exacerbation (HCC) (02/24/2018), COVID-19 (12/21/2018), COVID-19 virus infection (12/21/2018), Delirium, Diabetes mellitus type 2, uncomplicated (HCC) (11/16/2017), Diabetes mellitus without complication (HCC), Elevated rheumatoid factor (09/05/2017), Gallstones, GERD (gastroesophageal reflux disease), Hematemesis (04/28/2018), Hemoptysis (12/20/2018), Hypertension, Influenza A (04/13/2018), Left knee pain (06/12/2016), Left ventricular hypertrophy, Localized edema (05/10/2018), Osteoarthritis, Pain in finger of left hand (09/12/2016), Pain in soft tissues of limb (05/20/2013), Palliative care encounter (05/10/2018), Possible Seizures (HCC) (10/08/2017), Pressure injury of skin (10/29/2017), Prurigo nodularis, Rheumatoid arthritis (HCC), Right hand pain (03/21/2017), Seizures (HCC), Sepsis (HCC) (10/24/2017), Sepsis due to gram-negative UTI (HCC) (06/19/2019), Stroke [facility]), Stroke (HCC) (06/19/2019), Stroke-like episode (HCC) s/p IV tpa (10/04/2017), Tobacco abuse, Urinary tract infection (11/22/2017), UTI (urinary tract infection) (08/21/2018), and Vitamin D deficiency disease. here with AMS, weakness. Pt lives in SNF, non ambulatory at baseline. Received call from nursing home saying she had a UTI and was dehydrated. Found unresponsive this afternoon and  couldn't get a BP. Hypotensive, minimally responsive on arrival..   Labs Reviewed  LACTIC ACID, PLASMA - Abnormal; Notable for the following components:      Result Value   Lactic Acid, Venous 3.8 (*)    All other components within normal limits  COMPREHENSIVE METABOLIC PANEL - Abnormal; Notable for the following components:   CO2 21 (*)    Glucose, Bld 262 (*)    BUN 100 (*)    Creatinine, Ser 4.93 (*)    Calcium 8.6 (*)    Total Protein 6.3 (*)    Albumin 2.0 (*)    GFR, Estimated 8 (*)    All other components within normal limits  CBC WITH DIFFERENTIAL/PLATELET - Abnormal; Notable for the following components:   WBC 18.1 (*)    RBC 3.22 (*)    Hemoglobin 9.3 (*)    HCT 30.1 (*)    Platelets 127 (*)    nRBC 0.8 (*)    Neutro Abs 15.5 (*)    Abs Immature Granulocytes 0.14 (*)    All other components within normal limits  PROTIME-INR - Abnormal; Notable for the following components:   Prothrombin Time 15.7 (*)    All other components within normal limits  CBG MONITORING, ED - Abnormal; Notable for the following components:   Glucose-Capillary 56 (*)    All other components within normal limits  CBG MONITORING, ED - Abnormal; Notable for the following components:   Glucose-Capillary 167 (*)    All other components within normal limits  TROPONIN I (HIGH SENSITIVITY) - Abnormal; Notable for the following components:   Troponin I (High Sensitivity) 265 (*)    All other components within normal limits  CULTURE, BLOOD (ROUTINE X 2)  CULTURE, BLOOD (ROUTINE X 2)  APTT  LACTIC ACID, PLASMA  URINALYSIS, W/ REFLEX TO CULTURE (INFECTION SUSPECTED)  CBG MONITORING, ED  CBG MONITORING, ED  CBG MONITORING, ED  CBG MONITORING, ED  CBG MONITORING, ED  CBG MONITORING, ED  CBG MONITORING, ED  CBG MONITORING, ED  CBG MONITORING,  ED  CBG MONITORING, ED  CBG MONITORING, ED  CBG MONITORING, ED    Course of Care: -Shortly after assumption of care, pt noted to acutely brady down  then enter asystole. No spontaneous respirations noted. No corneal or gag reflex. Death pronounced, family at bedside.      Shaune Pollack, MD  [phone]

## 2022-09-15 NOTE — ED Provider Notes (Signed)
Katherine [name], husband, advises patient is now DNR and would NOT want intubation or CPR. This is a change in last couple months. Affirmed DNR   [name], MD 09/14/2022 [id number]

## 2022-09-15 NOTE — ED Notes (Signed)
Per MD, place IO and give pt D50.

## 2022-09-15 NOTE — ED Notes (Signed)
RN and Staff having a difficult time getting IV accesses. Provider notified

## 2022-09-15 NOTE — ED Notes (Signed)
This RN assumed care of pt, cont to try to get pulse ox and a BP but have been unable. Pt is non responsive at this time

## 2022-09-15 NOTE — ED Provider Notes (Signed)
[facility] Provider Note    Event Date/Time   First MD Initiated Contact with Patient 10/11/2022 2203     (approximate)   History   Fatigue   HPI  EM caveat: Severe lethargy, nonresponsive  Katherine [name] is a 82 y.o. female who presents unresponsive from nursing facility.  Patient was diagnosed with urinary tract infection and severe dehydration today per the patient's husband  EMS advised patient with hypotension and decreased responsiveness.  It is noted the patient does have a confirmed, signed, valid DNR form     Physical Exam   Triage Vital Signs: ED Triage Vitals  Encounter Vitals Group     BP 09/23/2022 2158 113/84     Systolic BP Percentile --      Diastolic BP Percentile --      Pulse Rate 10/01/2022 2158 93     Resp 09/21/2022 2158 (!) 22     Temp 09/30/2022 2158 98.2 F (36.8 C)     Temp Source 10/02/2022 2158 Oral     SpO2 09/27/2022 2158 100 %     Weight 10/02/2022 2200 194 lb 7.1 oz (88.2 kg)     Height --      Head Circumference --      Peak Flow --      Pain Score 10/01/2022 2200 0     Pain Loc --      Pain Education --      Exclude from Growth Chart --     Most recent vital signs: Vitals:   10/01/2022 2315 10/02/2022 2355  BP: (!) 63/41 (!) 78/44  Pulse:  72  Resp: (!) 26 20  Temp:    SpO2:       General: Obtunded.  Blinks eyes appropriately, stares forward blankly. CV:  Poor peripheral perfusion cool to touch in extremities bilaterally.  Normal tones, weak radial pulses Resp:  Lung sounds primarily clear but slightly diminished in the wrong right lung base.  No crackles no wheezing Cheyne-Stokes respiratory pattern Abd:  No distention.  Soft no obvious tenderness Other:     ED Results / Procedures / Treatments   Labs (all labs ordered are listed, but only abnormal results are displayed) Labs Reviewed  LACTIC ACID, PLASMA - Abnormal; Notable for the following components:      Result Value   Lactic Acid, Venous 3.8 (*)     All other components within normal limits  COMPREHENSIVE METABOLIC PANEL - Abnormal; Notable for the following components:   CO2 21 (*)    Glucose, Bld 262 (*)    BUN 100 (*)    Creatinine, Ser 4.93 (*)    Calcium 8.6 (*)    Total Protein 6.3 (*)    Albumin 2.0 (*)    GFR, Estimated 8 (*)    All other components within normal limits  CBC WITH DIFFERENTIAL/PLATELET - Abnormal; Notable for the following components:   WBC 18.1 (*)    RBC 3.22 (*)    Hemoglobin 9.3 (*)    HCT 30.1 (*)    Platelets 127 (*)    nRBC 0.8 (*)    Neutro Abs 15.5 (*)    Abs Immature Granulocytes 0.14 (*)    All other components within normal limits  PROTIME-INR - Abnormal; Notable for the following components:   Prothrombin Time 15.7 (*)    All other components within normal limits  CBG MONITORING, ED - Abnormal; Notable for the following components:   Glucose-Capillary 56 (*)  All other components within normal limits  CBG MONITORING, ED - Abnormal; Notable for the following components:   Glucose-Capillary 167 (*)    All other components within normal limits  TROPONIN I (HIGH SENSITIVITY) - Abnormal; Notable for the following components:   Troponin I (High Sensitivity) 265 (*)    All other components within normal limits  CULTURE, BLOOD (ROUTINE X 2)  CULTURE, BLOOD (ROUTINE X 2)  APTT  LACTIC ACID, PLASMA  URINALYSIS, W/ REFLEX TO CULTURE (INFECTION SUSPECTED)  CBG MONITORING, ED  CBG MONITORING, ED  CBG MONITORING, ED  CBG MONITORING, ED  CBG MONITORING, ED  CBG MONITORING, ED  CBG MONITORING, ED  CBG MONITORING, ED  CBG MONITORING, ED  CBG MONITORING, ED  CBG MONITORING, ED  CBG MONITORING, ED   Labs notable for elevated lactic acid, mild hyperglycemia significant AKI with creatinine approximating 5, leukocytosis, stable hemoglobin.  Troponin elevated to 65  EKG  Entered by me at 2205 heart rate 95 QRS 80 QTc 500 Suspect normal sinus rhythm significant baseline artifact.  No  evidence of obvious frank ischemia.   RADIOLOGY  Chest x-ray interpreted by me as right lower lobe airspace opacity, potentially chronic  PROCEDURES:  Critical Care performed: Yes, see critical care procedure note(s)  CRITICAL CARE Performed by: [name]   Total critical care time: 35 minutes  Critical care time was exclusive of separately billable procedures and treating other patients.  Critical care was necessary to treat or prevent imminent or life-threatening deterioration.  Critical care was time spent personally by me on the following activities: development of treatment plan with patient and/or surrogate as well as nursing, discussions with consultants, evaluation of patient's response to treatment, examination of patient, obtaining history from patient or surrogate, ordering and performing treatments and interventions, ordering and review of laboratory studies, ordering and review of radiographic studies, pulse oximetry and re-evaluation of patient's condition.   Procedures   MEDICATIONS ORDERED IN ED: Medications  glucagon (human recombinant) (GLUCAGEN) injection 1 mg (0 mg Intramuscular Hold 10/06/2022 2302)  lacosamide (VIMPAT) 100 mg in sodium chloride 0.9 % 25 mL IVPB (100 mg Intravenous Not Given  0046)  cefTRIAXone (ROCEPHIN) 1 g in sodium chloride 0.9 % 100 mL IVPB (1 g Intravenous Not Given  0046)  dextrose 50 % solution 50 mL (50 mLs Intravenous Given 09/20/2022 2209)  levETIRAcetam (KEPPRA) IVPB 1000 mg/100 mL premix (0 mg Intravenous Stopped 10/01/2022 2302)  lactated ringers bolus 1,000 mL (1,000 mLs Intravenous New Bag/Given 09/22/2022 2247)  cefTRIAXone (ROCEPHIN) 1 g in sodium chloride 0.9 % 100 mL IVPB (1 g Intravenous New Bag/Given 10/01/2022 2305)  lactated ringers bolus 1,000 mL (1,000 mLs Intravenous New Bag/Given 10/10/2022 2323)  lactated ringers bolus 500 mL (500 mLs Intravenous New Bag/Given 09/27/2022 2324)     IMPRESSION / MDM / ASSESSMENT AND PLAN / ED  COURSE  I reviewed the triage vital signs and the nursing notes.                              Differential diagnosis includes, but is not limited to, acute encephalopathy, subclinical status, sepsis, shock, acute central neurologic abnormality such as stroke, or other acute life-threatening pathology.  She does not show clear evidence of ACS.  She does have alteration in mental status, severe hypotension, and a recent history of initiating antibiotics today for concerns of UTI and "dehydration"  She is clearly in critically ill condition, updated  and notified her husband and he is come to the hospital to see her.  He has wished to continue her DNR status  Patient's presentation is most consistent with acute presentation with potential threat to life or bodily function.   The patient is on the cardiac monitor to evaluate for evidence of arrhythmia and/or significant heart rate changes.  I empirically been giving antiepileptic given the patient's previous history.  Labs are notable for severe AKI suggesting likely severe dehydration and prerenal cause.  Patient was originally showing improvement with blood pressure improved from the 60s to the low 80s with IV fluids and consideration for peripheral pressures being made.  However, discussed with husband and aerobic measures not being entertained and he primarily wishes for comfort at this time given the chronicity of her illnesses and recent debility.  He is however comfortable with continue with IV fluids and antibiotics        FINAL CLINICAL IMPRESSION(S) / ED DIAGNOSES   Final diagnoses:  Shock (HCC)  AKI (acute kidney injury) (HCC)     Rx / DC Orders   ED Discharge Orders     None        Note:  This document was prepared using Dragon voice recognition software and may include unintentional dictation errors.   [name], MD  [phone]

## 2022-09-15 NOTE — ED Notes (Addendum)
Pt appears to have aginal resp at this time. Pt did Brady into the upper 20's as a HR. RN notified provider. Provider at bedside. RN unable to get a BP at this time with a pulse ox due to poor perfusion.. Pt is ash and gray. MD to call family.

## 2022-09-15 NOTE — ED Triage Notes (Addendum)
Pt arrived via EMS from [facility]. Pt is very slow to respond. Pt is gray in color. EMS sts that pt o2 had to be increased since her previous o2 levels were supplying pt in the low 90's. Pt is responsive to pain only. Per facility pt has been declining in health. Pt family was notified by the facility and they plus the facility MD wanted pt to be brought to [facility].

## 2022-09-15 NOTE — ED Notes (Signed)
CBG 56. RN and MD notified.

## 2022-09-16 ENCOUNTER — Emergency Department: Payer: Medicare HMO

## 2022-09-16 DIAGNOSIS — I7143 Infrarenal abdominal aortic aneurysm, without rupture: Secondary | ICD-10-CM | POA: Diagnosis not present

## 2022-09-16 DIAGNOSIS — D3502 Benign neoplasm of left adrenal gland: Secondary | ICD-10-CM | POA: Diagnosis not present

## 2022-09-16 DIAGNOSIS — R4182 Altered mental status, unspecified: Secondary | ICD-10-CM | POA: Diagnosis not present

## 2022-09-16 DIAGNOSIS — R579 Shock, unspecified: Secondary | ICD-10-CM | POA: Diagnosis not present

## 2022-09-16 DIAGNOSIS — I6389 Other cerebral infarction: Secondary | ICD-10-CM | POA: Diagnosis not present

## 2022-09-16 DIAGNOSIS — N200 Calculus of kidney: Secondary | ICD-10-CM | POA: Diagnosis not present

## 2022-09-16 MED ORDER — SODIUM CHLORIDE 0.9 % IV SOLN: 1.0000 g | Freq: Once | INTRAVENOUS | Status: DC

## 2022-09-18 DIAGNOSIS — J449 Chronic obstructive pulmonary disease, unspecified: Secondary | ICD-10-CM | POA: Diagnosis not present

## 2022-09-18 DIAGNOSIS — N184 Chronic kidney disease, stage 4 (severe): Secondary | ICD-10-CM | POA: Diagnosis not present

## 2022-09-18 DIAGNOSIS — E119 Type 2 diabetes mellitus without complications: Secondary | ICD-10-CM | POA: Diagnosis not present

## 2022-09-18 DIAGNOSIS — J9622 Acute and chronic respiratory failure with hypercapnia: Secondary | ICD-10-CM | POA: Diagnosis not present

## 2022-09-18 DIAGNOSIS — M6281 Muscle weakness (generalized): Secondary | ICD-10-CM | POA: Diagnosis not present

## 2022-09-18 DIAGNOSIS — R5383 Other fatigue: Secondary | ICD-10-CM | POA: Diagnosis not present

## 2022-09-18 DIAGNOSIS — I10 Essential (primary) hypertension: Secondary | ICD-10-CM | POA: Diagnosis not present

## 2022-10-15 NOTE — Sepsis Progress Note (Signed)
Following for sepsis monitoring ?

## 2022-10-15 NOTE — ED Notes (Signed)
[facility] called--the referral number is [id number]

## 2022-10-15 NOTE — Progress Notes (Signed)
Paged for pt death. Introduced myself to family, offered my condolences and answered some preliminary questions.Shared with family in room, that staff would give them some time to say there goodbyes and have time with loved one. Shared with them next steps of the funeral home being contacted and they would reach out to them for further details. Family in room shared more was on the way. Offered to stay close by, husband shared they were all set at this time and would reach out if they needed anything. Please page if family needs anything further

## 2022-10-15 NOTE — ED Notes (Signed)
[facility] called at [phone] and they stated they'll pick pt up at [facility]

## 2022-10-15 DEATH — deceased

## 2023-07-25 IMAGING — CT CT ABD-PELV W/ CM
2 of 5 series · 15 of 46 positions shown, 17 images · IV contrast (APPLIED)
Comparison: 06/19/2019

CLINICAL DATA: Abdominal pain

EXAM:
CT ABDOMEN AND PELVIS WITH CONTRAST
TECHNIQUE: Multidetector CT imaging of the abdomen and pelvis was performed
using the standard protocol following bolus administration of
intravenous contrast.
CONTRAST:  75mL OMNIPAQUE IOHEXOL 300 MG/ML  SOLN

[Series 2: routine abd/pel with · axial · 0.90mm/px · z∈[-1037,-652]mm · 12 of 93 slices shown, 14 images]
[im 8/93  soft-tissue]
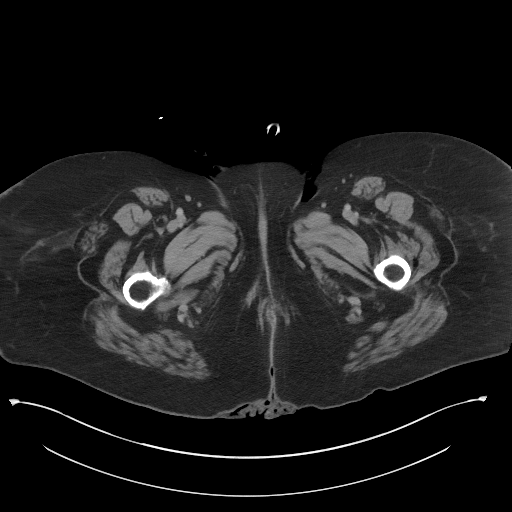
[im 8/93  bone]
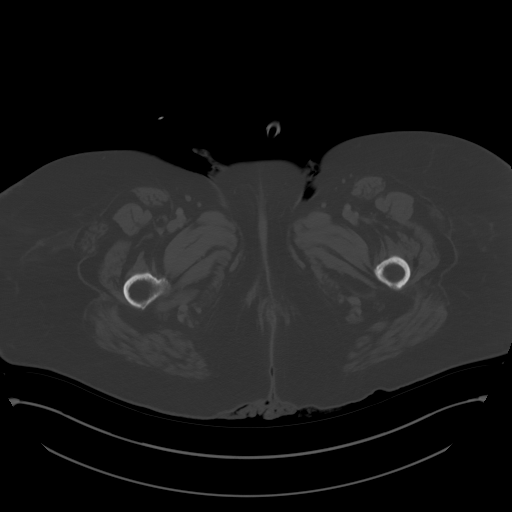
[im 15/93  soft-tissue]
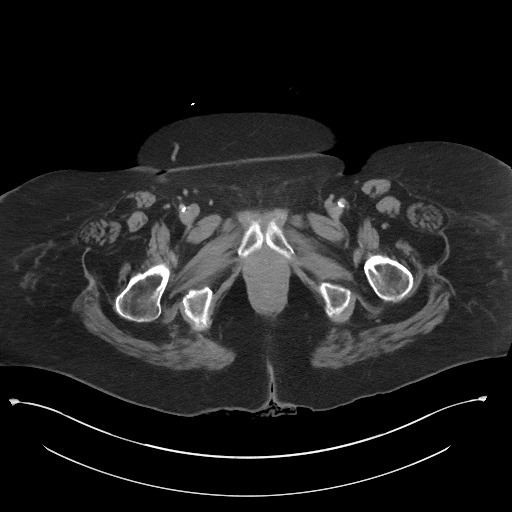
[im 22/93  soft-tissue]
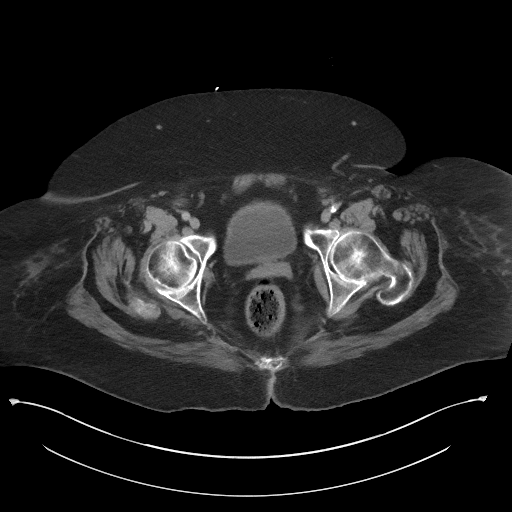
[im 29/93  soft-tissue]
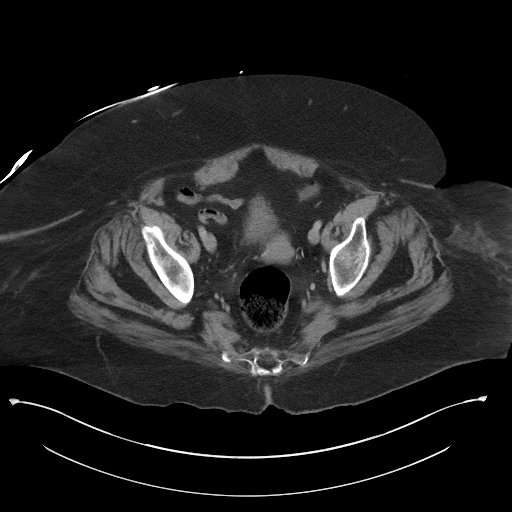
[im 36/93  soft-tissue]
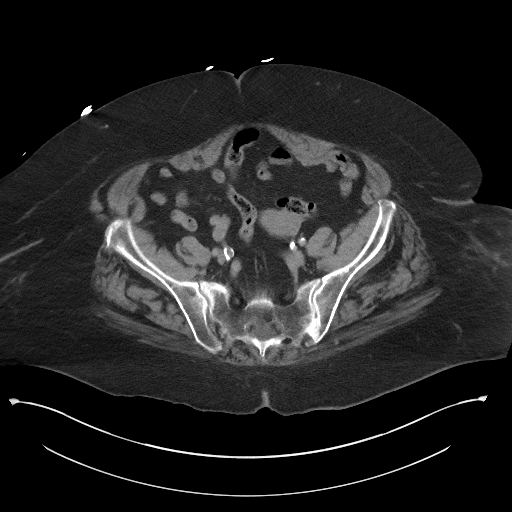
[im 43/93  soft-tissue]
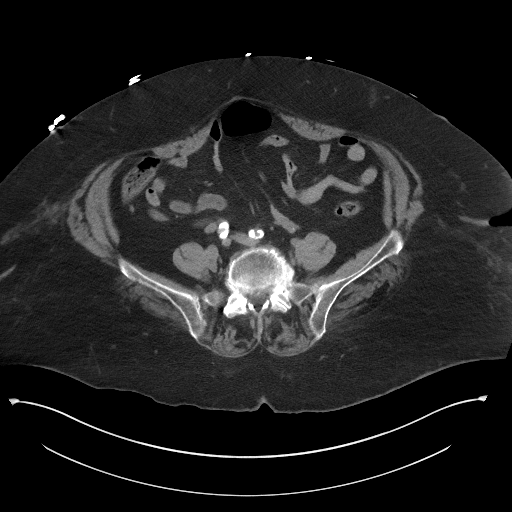
[im 50/93  soft-tissue]
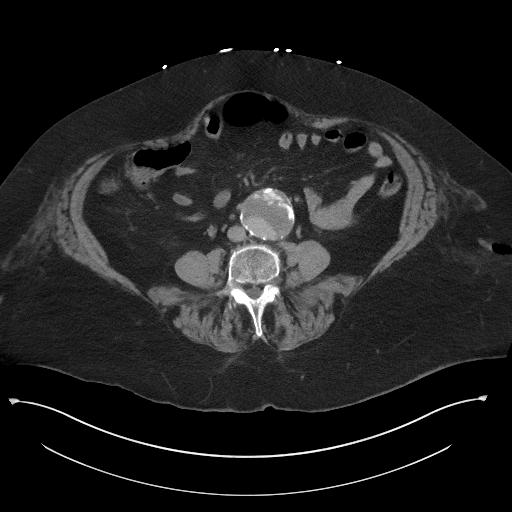
[im 57/93  soft-tissue]
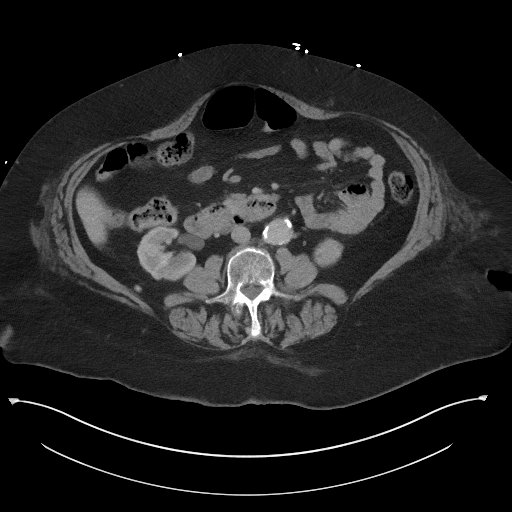
[im 64/93  soft-tissue]
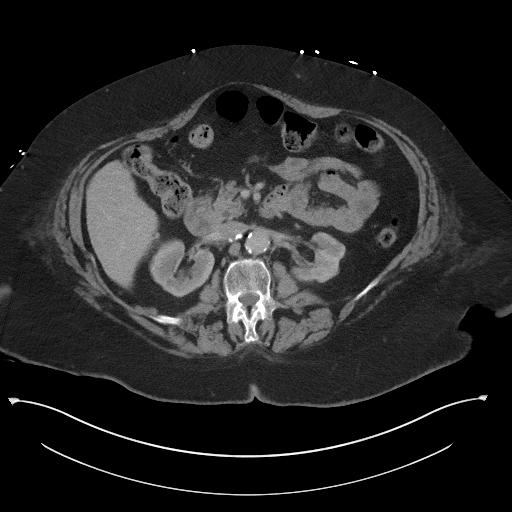
[im 64/93  bone]
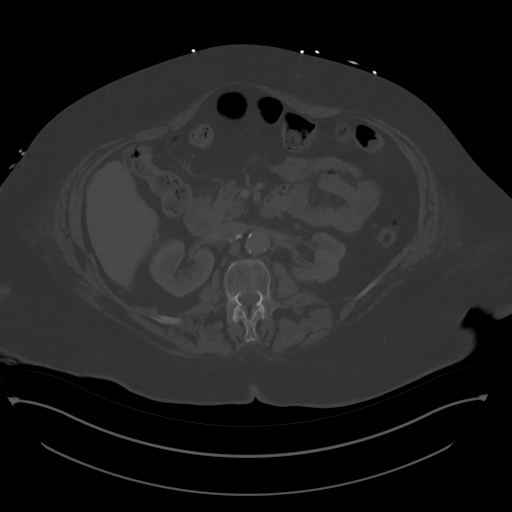
[im 71/93  soft-tissue]
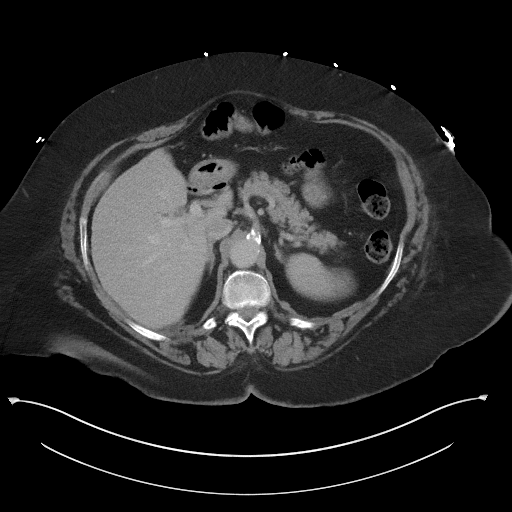
[im 78/93  soft-tissue]
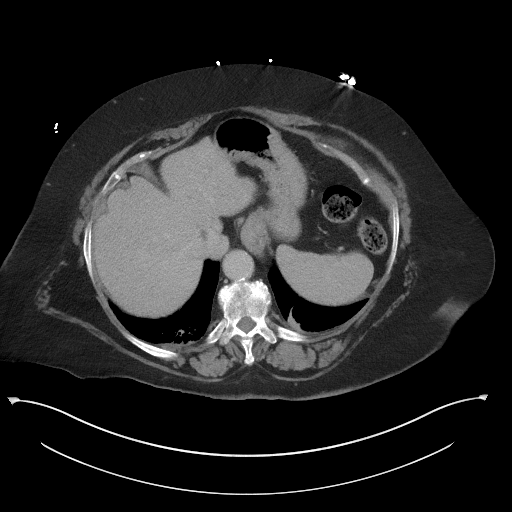
[im 85/93  soft-tissue]
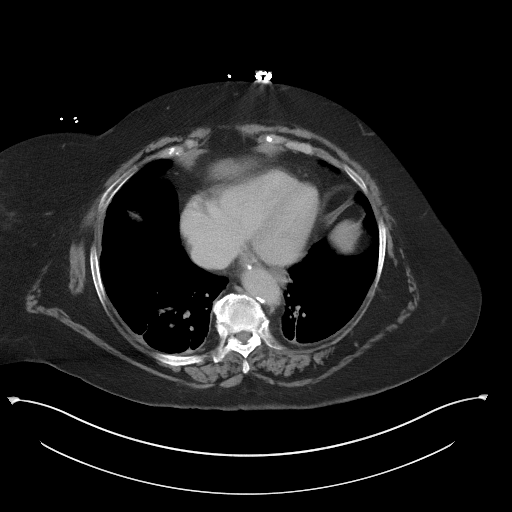

[Series 5: coronal st · coronal · 0.91mm/px · 3 of 110 slices shown]
[im 37/110  soft-tissue]
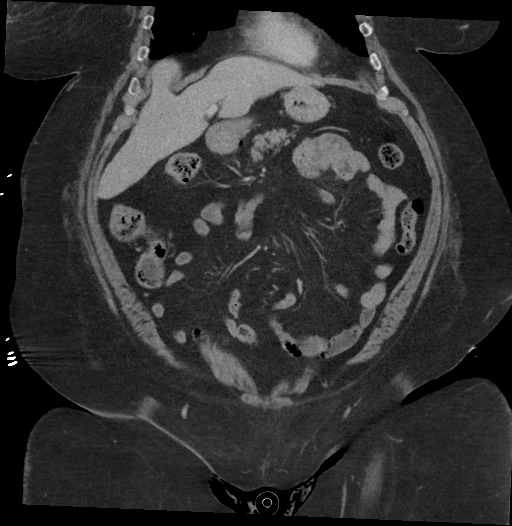
[im 49/110  soft-tissue]
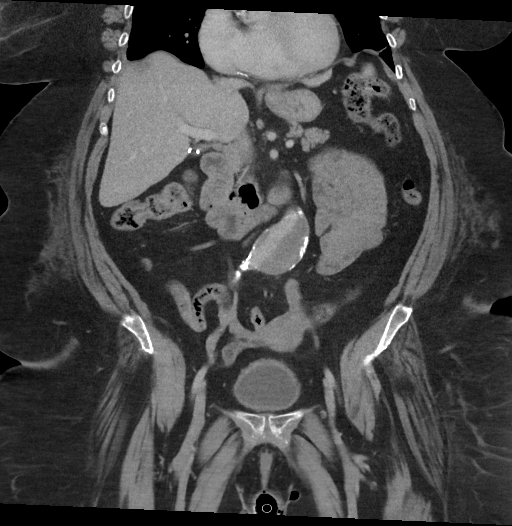
[im 61/110  soft-tissue]
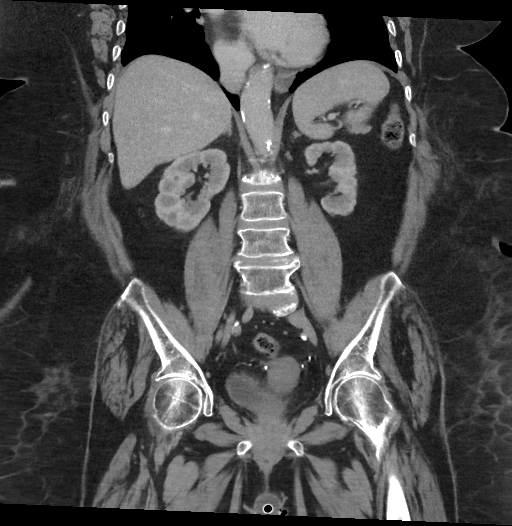

[15 of 46 positions shown; findings below may reference images not displayed]

FINDINGS: Lower chest: Linear areas of scarring identified within the imaged
portions of the lung bases.

Hepatobiliary: No focal liver abnormality is seen. Status post
cholecystectomy. No biliary dilatation.

Pancreas: Unremarkable. No pancreatic ductal dilatation or
surrounding inflammatory changes.

Spleen: Normal in size without focal abnormality.

Adrenals/Urinary Tract: Left adrenal nodule containing calcification
measures 1.4 cm and is unchanged from 06/19/2019, image [DATE]. Right
adrenal nodule measures 1.3 cm, image [DATE]. Also unchanged.
Nonobstructing calculus is identified within the inferior pole of
the right kidney measuring 2-3 mm, image 39/2. 1.5 cm exophytic cyst
arising off the posterior cortex of the upper pole of left kidney
has a uniform appearance measuring 28 Hounsfield units, compatible
with a Bosniak class 2 cyst, image [DATE]. No hydronephrosis
identified bilaterally. Urinary bladder is unremarkable.

Stomach/Bowel: Small hiatal hernia. The appendix is visualized and
appears normal. No bowel wall thickening, inflammation, or
distension. Moderate retained stool identified within the rectum.

Vascular/Lymphatic: Aortic atherosclerosis. Infrarenal abdominal
aortic aneurysm is identified this has a maximum AP diameter of
cm, image 44/2. Formally this was measured at 4.3 cm. There is
extensive eccentric partially calcified mural thrombus associated
with the aneurysm. No abdominopelvic adenopathy.

Reproductive: Uterus and bilateral adnexa are unremarkable.

Other: No free fluid or fluid collections.

Musculoskeletal: Mild multilevel degenerative disc disease within
the thoracolumbar spine.
IMPRESSION: 1. No acute findings identified within the abdomen or pelvis.
[DATE] cm infrarenal abdominal aortic aneurysm. Recommend follow-up
every 6 months and vascular consultation. Reference: [HOSPITAL] [id number];[DATE].
3. Nonobstructing right renal calculus.
4. Bilateral adrenal nodules are unchanged from previous exam and
favored to represent benign adenomas.
5. Benign Bosniak class 2 cyst arises off the posterior cortex of
the left kidney.
6. Aortic Atherosclerosis ([id number]).

## 2023-07-25 IMAGING — DX DG CHEST 1V PORT
1 series · 1 of 1 positions shown · non-contrast
Comparison: Limb 5585

CLINICAL DATA: Pain.

EXAM:
PORTABLE CHEST 1 VIEW

[chest ap]
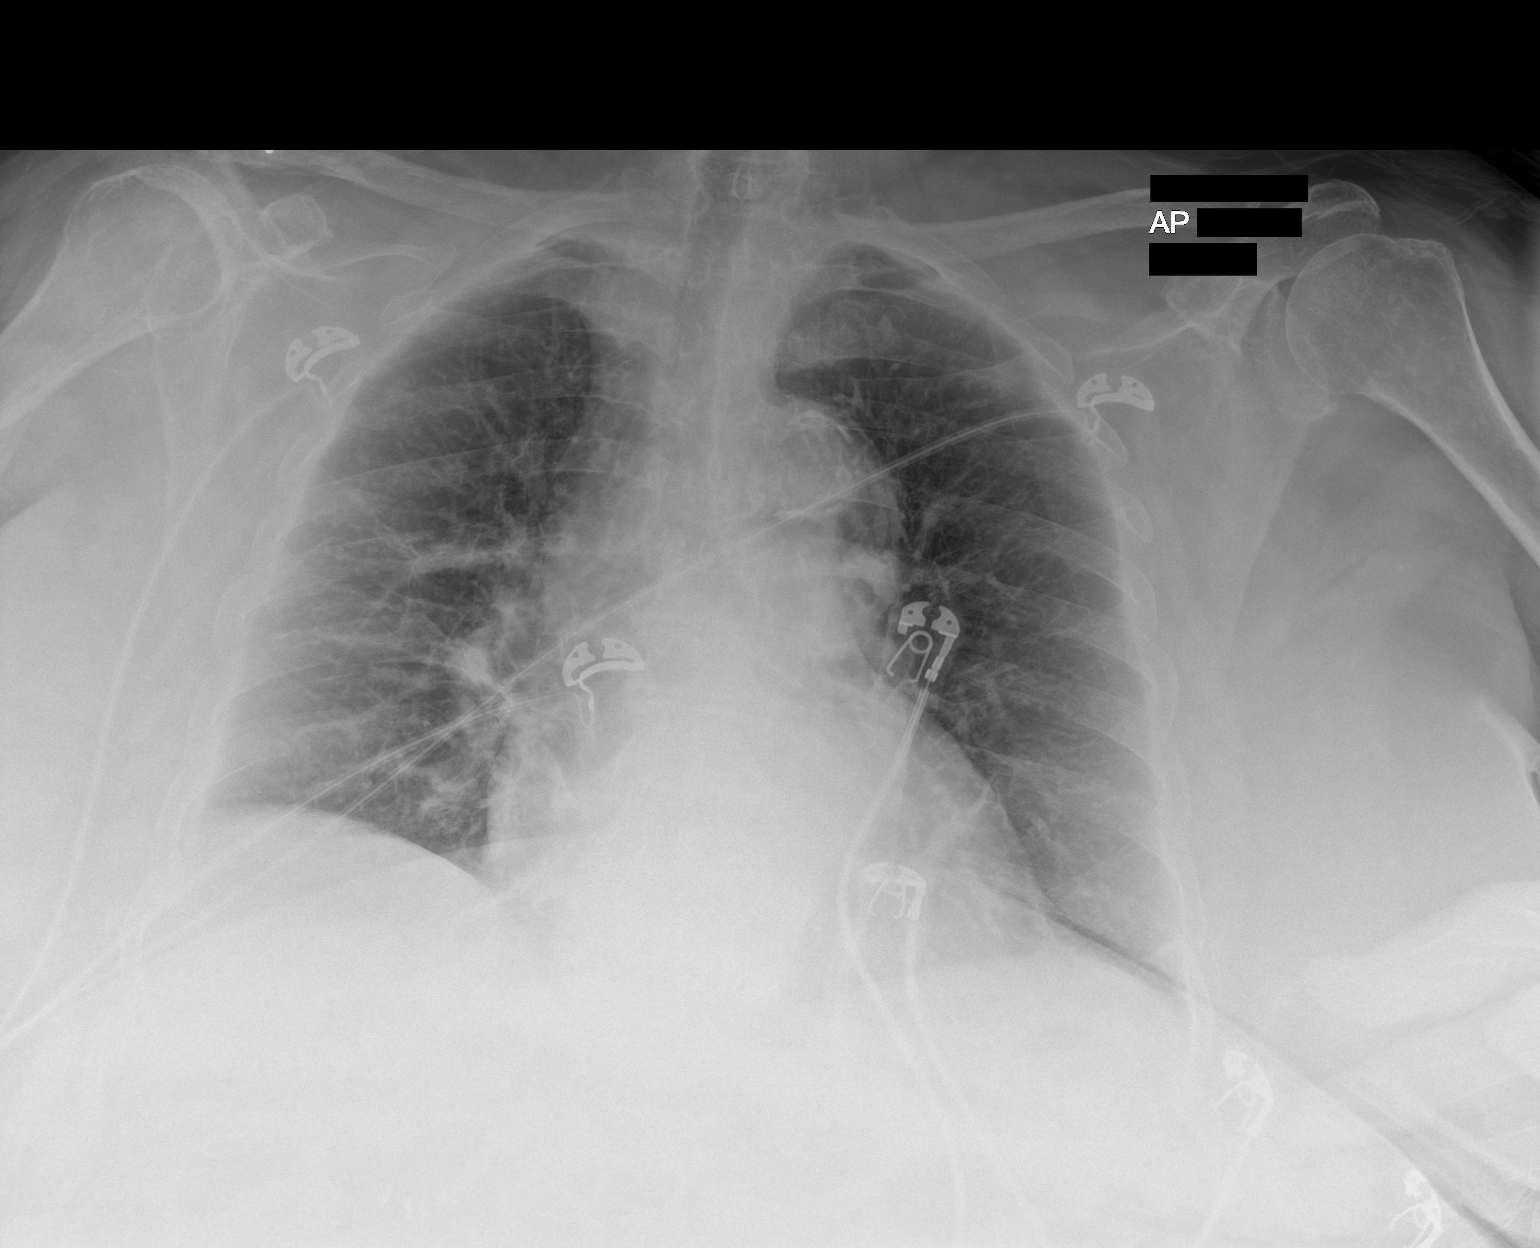

[1 of 1 positions shown; findings below may reference images not displayed]

FINDINGS: 2996 hours. Low volume film. The cardio pericardial silhouette is
enlarged. There is pulmonary vascular congestion without overt
pulmonary edema. The visualized bony structures of the thorax show
no acute abnormality. Similar appearance basilar chronic atelectasis
or scarring. Telemetry leads overlie the chest.
IMPRESSION: Enlargement of the cardiopericardial silhouette with vascular
congestion.

## 2023-10-30 IMAGING — MR MR HEAD W/O CM
10 series · 48 of 48 positions shown · non-contrast
Comparison: Prior head CT examinations 04/19/2021 and earlier.
Brain MRI 02/11/2019.

CLINICAL DATA: Mental status change, unknown cause.

EXAM:
MRI HEAD WITHOUT CONTRAST
TECHNIQUE: Multiplanar, multiecho pulse sequences of the brain and surrounding
structures were obtained without intravenous contrast.

[Series 2: ax dwi_tracew · axial · 3.0mm · 1.31mm/px · z∈[-88,+55]mm · 5 of 48 slices shown]
[im 1/48]
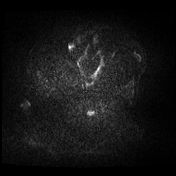
[im 12/48]
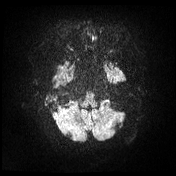
[im 24/48]
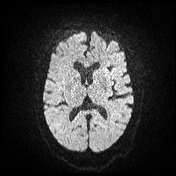
[im 36/48]
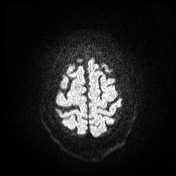
[im 48/48]
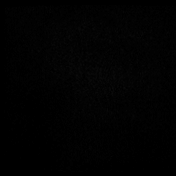

[Series 3: ax dwi_adc · axial · 3.0mm · 1.31mm/px · z∈[-88,+52]mm · 5 of 46 slices shown]
[im 1/46]
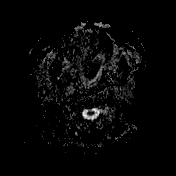
[im 12/46]
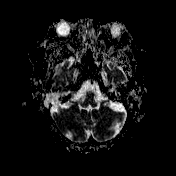
[im 23/46]
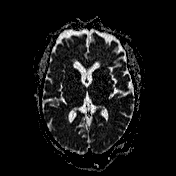
[im 34/46]
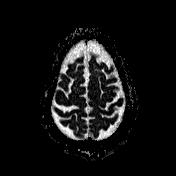
[im 46/46]
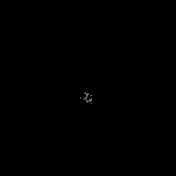

[Series 4: cor dwi_tracew · coronal · 5.0mm · 1.31mm/px · 10 of 76 slices shown]
[im 1/76]
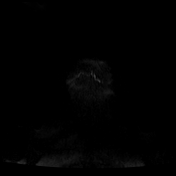
[im 9/76]
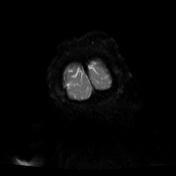
[im 17/76]
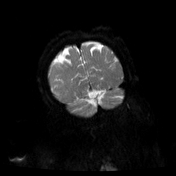
[im 26/76]
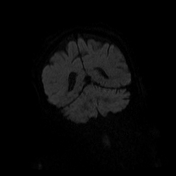
[im 34/76]
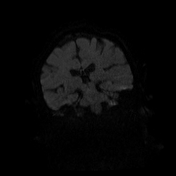
[im 42/76]
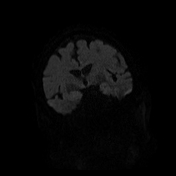
[im 51/76]
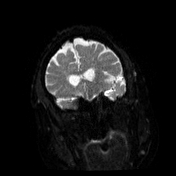
[im 59/76]
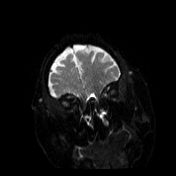
[im 67/76]
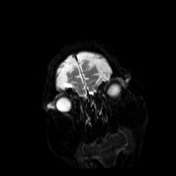
[im 76/76]
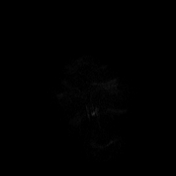

[Series 5: cor dwi_adc · coronal · 5.0mm · 1.31mm/px · 5 of 38 slices shown]
[im 1/38]
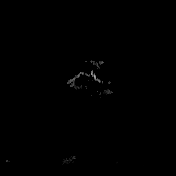
[im 10/38]
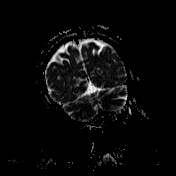
[im 19/38]
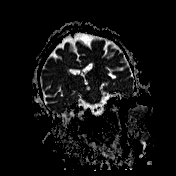
[im 28/38]
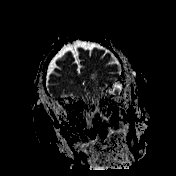
[im 38/38]
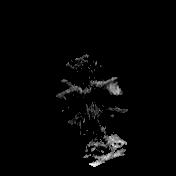

[Series 6: T1 · sagittal · 5.0mm · 0.94mm/px · 3 of 23 slices shown (1 of 2)]
[im 1/23]
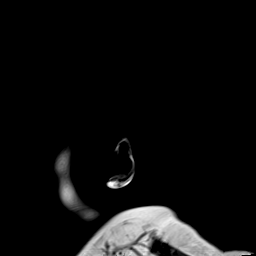
[im 12/23]
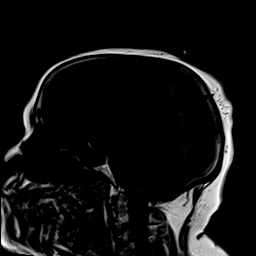
[im 23/23]
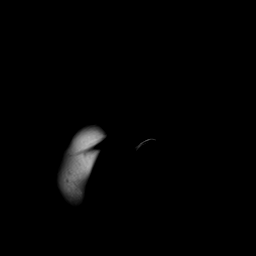

[Series 7: T2 · axial · 5.0mm · 0.45mm/px · z∈[-85,+60]mm · 4 of 27 slices shown (1 of 2)]
[im 1/27]
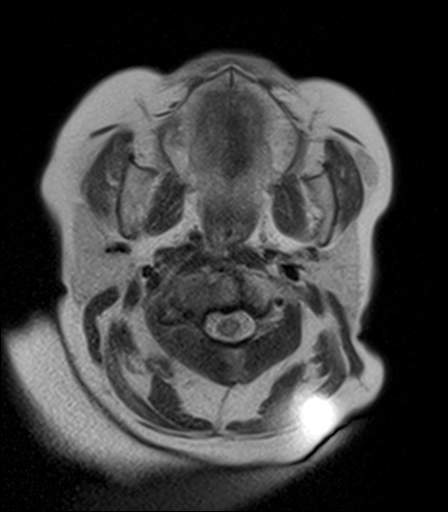
[im 9/27]
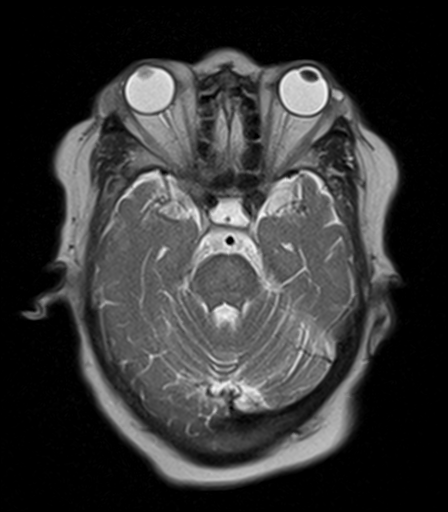
[im 18/27]
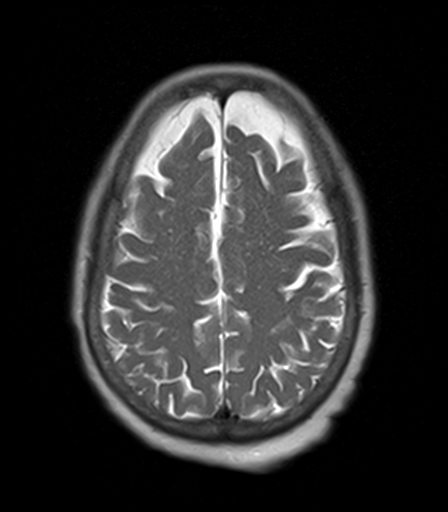
[im 27/27]
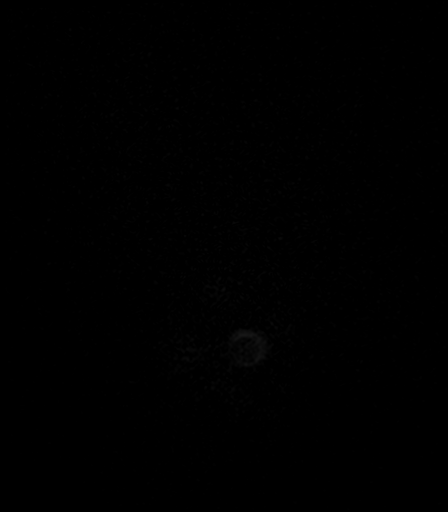

[Series 8: T2-star · axial · 5.0mm · 0.45mm/px · z∈[-85,+60]mm · 4 of 27 slices shown]
[im 1/27]
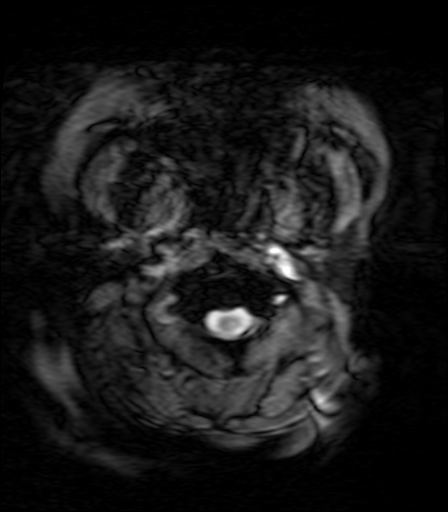
[im 9/27]
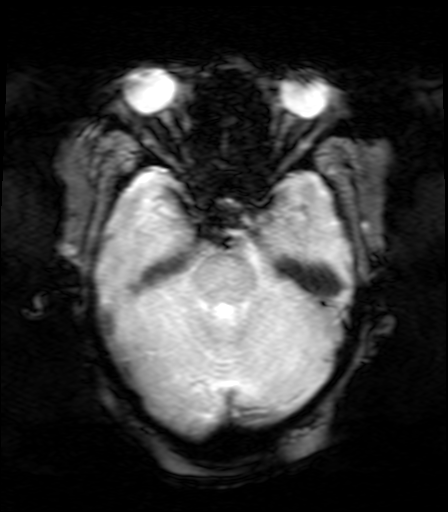
[im 18/27]
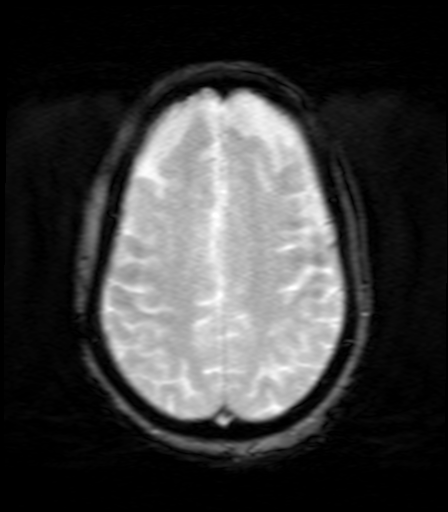
[im 27/27]
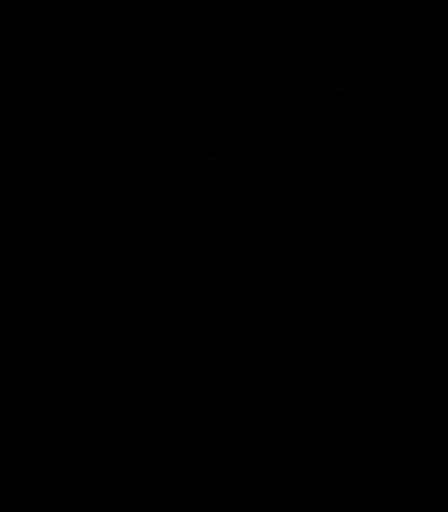

[Series 9: FLAIR · axial · 5.0mm · 1.20mm/px · z∈[-89,+56]mm · 4 of 27 slices shown]
[im 1/27]
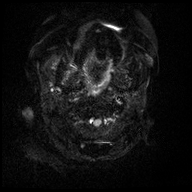
[im 9/27]
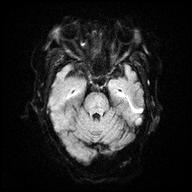
[im 18/27]
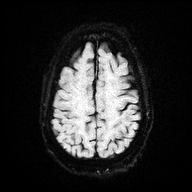
[im 27/27]
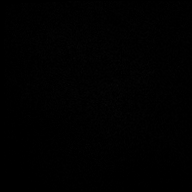

[Series 10: T1 · axial · 5.0mm · 0.90mm/px · z∈[-85,+60]mm · 4 of 27 slices shown (2 of 2)]
[im 1/27]
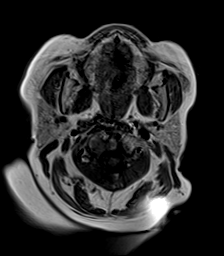
[im 9/27]
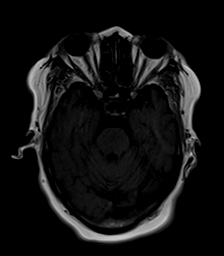
[im 18/27]
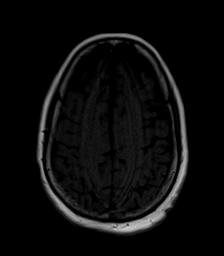
[im 27/27]
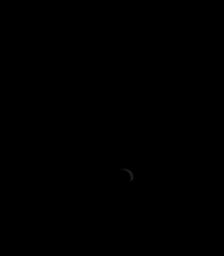

[Series 11: T2 · coronal · 5.0mm · 0.45mm/px · 4 of 31 slices shown (2 of 2)]
[im 1/31]
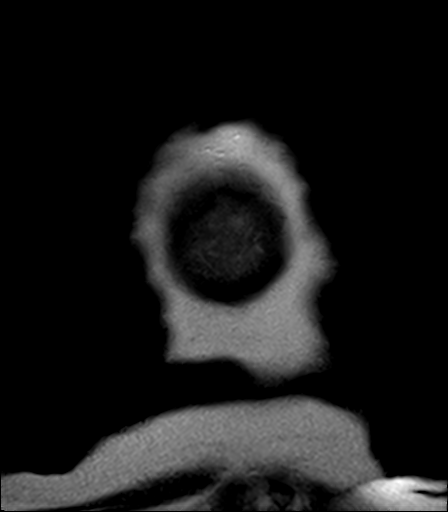
[im 11/31]
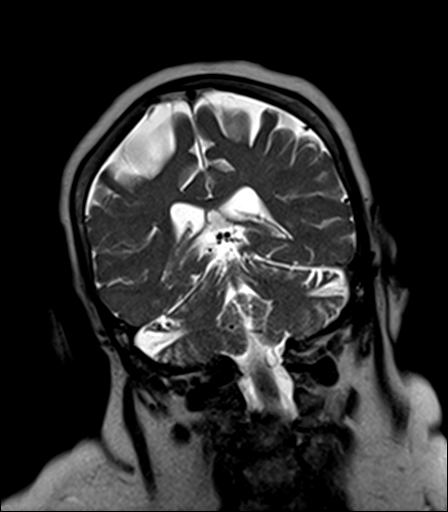
[im 21/31]
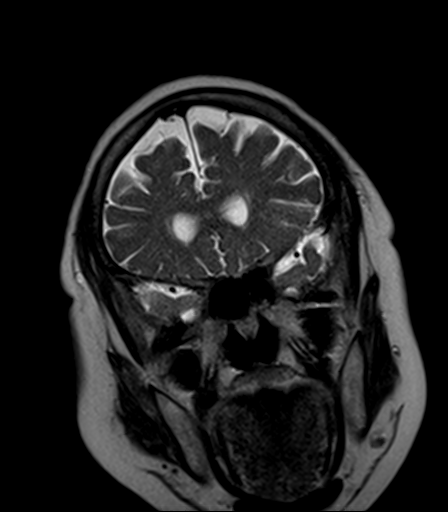
[im 31/31]
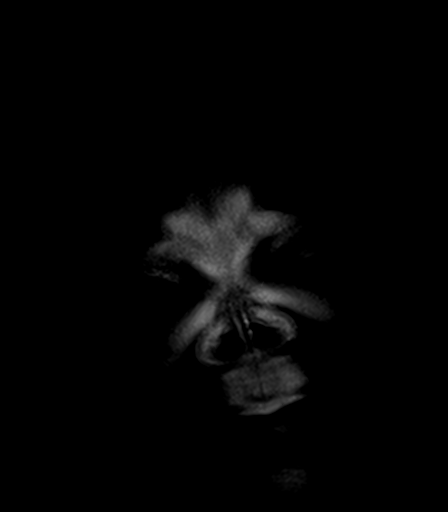

[48 of 48 positions shown; findings below may reference images not displayed]

FINDINGS: Intermittently motion degraded examination. Most notably, there is
moderate motion degradation of the sagittal T1 weighted sequence and
mild motion degradation of the axial T1 weighted sequence.

Brain:

No age advanced or lobar predominant cerebral atrophy.

Moderate multifocal T2 FLAIR hyperintense signal abnormality within
the cerebral white matter and pons, nonspecific but compatible
chronic small vessel disease. As before, chronic small vessel
ischemic changes are also present within the right basal ganglia.

Partially empty sella turcica.

There is no acute infarct.

No evidence of an intracranial mass.

No chronic intracranial blood products.

No extra-axial fluid collection.

No midline shift.

Vascular: Maintained flow voids within the proximal large arterial
vessels.

Skull and upper cervical spine: No focal suspicious marrow lesion.
Incompletely assessed cervical spondylosis.

Sinuses/Orbits: Bilateral proptosis. Small mucous retention cyst
within the left sphenoid sinus. Mild mucosal thickening within the
bilateral ethmoid sinuses.

Other: Right mastoid effusion. Small-volume fluid also present
within the left mastoid air cells.
IMPRESSION: No evidence of acute intracranial abnormality.

Moderate chronic small vessel ischemic changes within the cerebral
white matter and pons. Chronic small vessel ischemic changes are
also present within the right basal ganglia. These findings have not
significantly changed from the prior brain MRI of 02/11/2019.

Bilateral proptosis.

Mild paranasal sinus disease, as described.

Right mastoid effusion.

Small-volume fluid also present within the left mastoid air cells.

## 2023-10-30 IMAGING — DX DG CHEST 1V PORT
1 series · 1 of 1 positions shown · non-contrast
Comparison: Chest x-ray 01/12/2021.

CLINICAL DATA: 80-year-old female with history of altered mental
status.

EXAM:
PORTABLE CHEST 1 VIEW

[chest ap]
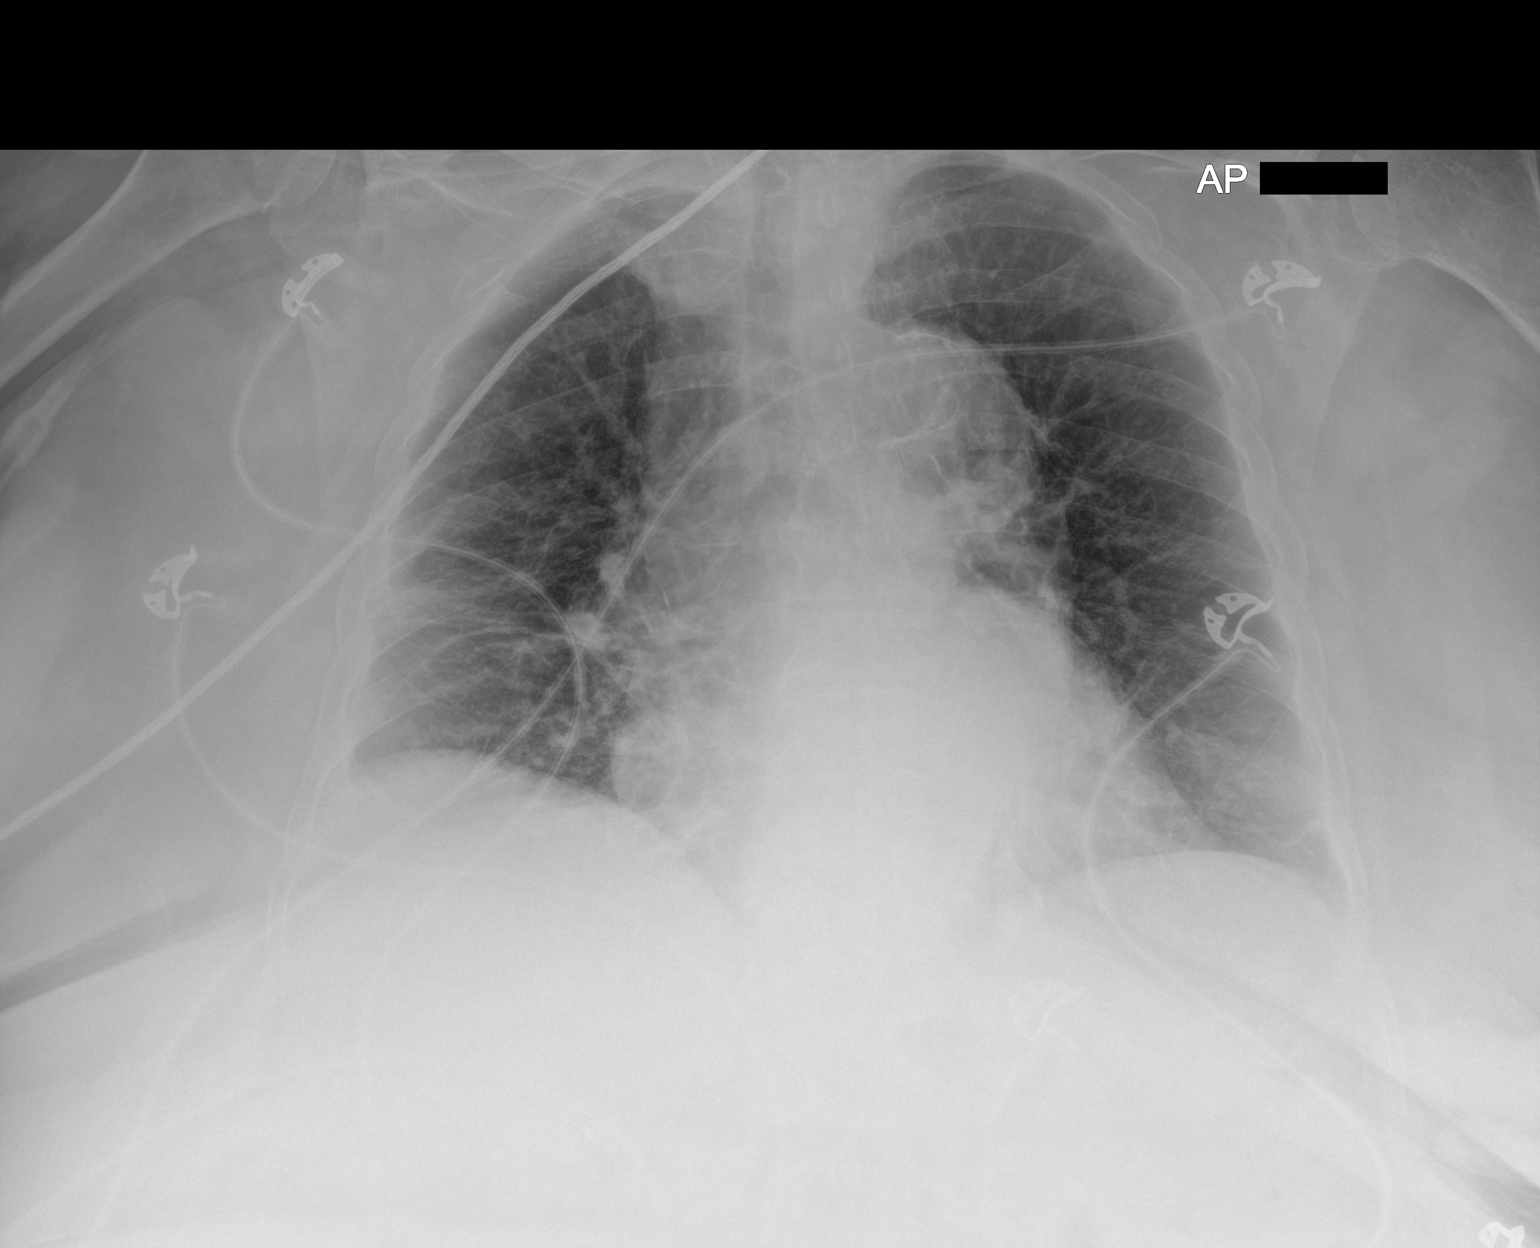

[1 of 1 positions shown; findings below may reference images not displayed]

FINDINGS: Lung volumes are low. Diffuse areas of interstitial prominence and
widespread peribronchial cuffing, slightly increased compared to the
prior study, particularly in the right mid to lower lung. No
confluent consolidative airspace disease. No pleural effusions. No
pneumothorax. No evidence of pulmonary edema. Heart size is mildly
enlarged. The patient is rotated to the right on today's exam,
resulting in distortion of the mediastinal contours and reduced
diagnostic sensitivity and specificity for mediastinal pathology.
Atherosclerotic calcifications are noted in the thoracic aorta.
IMPRESSION: 1. The appearance of the lungs could suggests potential interstitial
lung disease. Further evaluation with high-resolution chest CT is
recommended in the near future to better evaluate these findings.
2. Aortic atherosclerosis.
3. Mild cardiomegaly.

## 2023-10-30 IMAGING — CT CT HEAD W/O CM
4 of 5 series · 14 of 47 positions shown, 16 images · non-contrast
Comparison: Head CT 12/28/2019.

CLINICAL DATA: 80-year-old female with history of altered mental
status.



[Series 2: head wo · axial · 0.49mm/px · z∈[-126,-72]mm · 3 of 29 slices shown]
[im 6/29  brain]
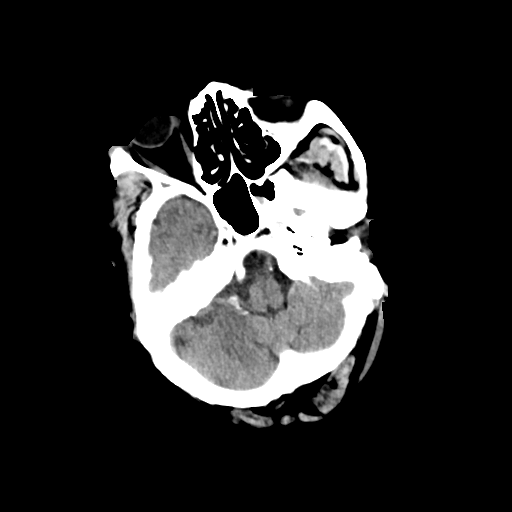
[im 12/29  brain]
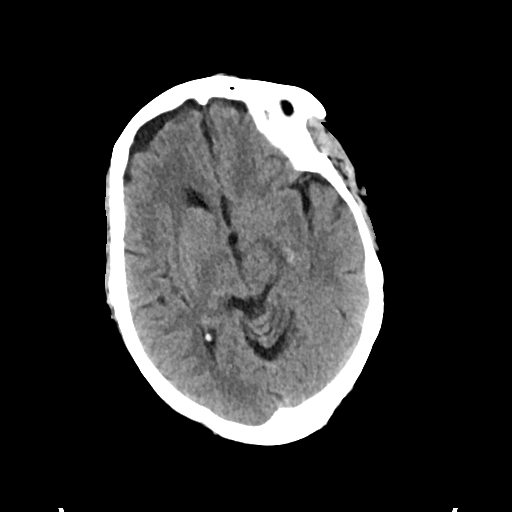
[im 17/29  brain]
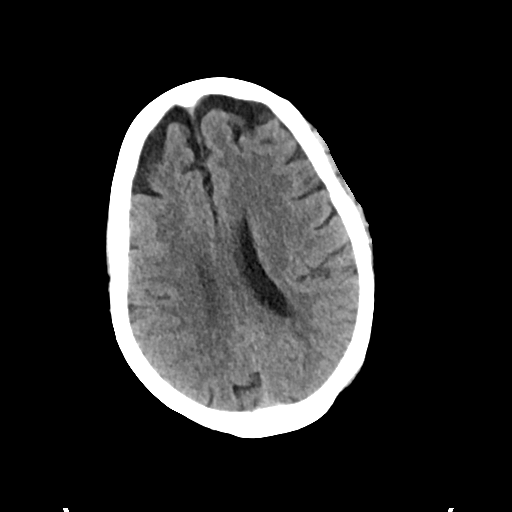

[Series 4: cor soft · coronal · 0.29mm/px · 3 of 65 slices shown]
[im 22/65  brain]
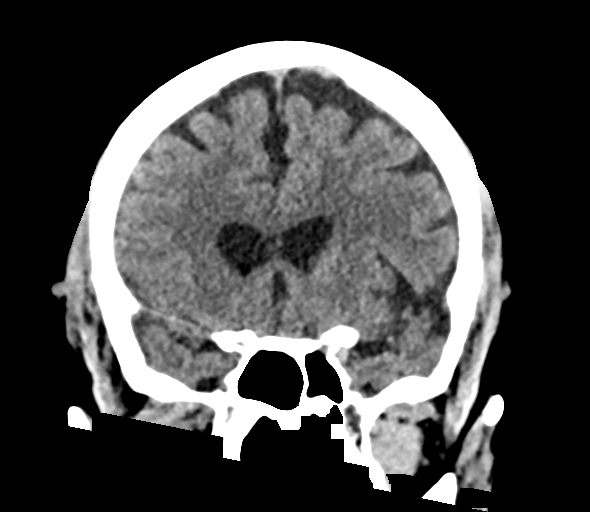
[im 29/65  brain]
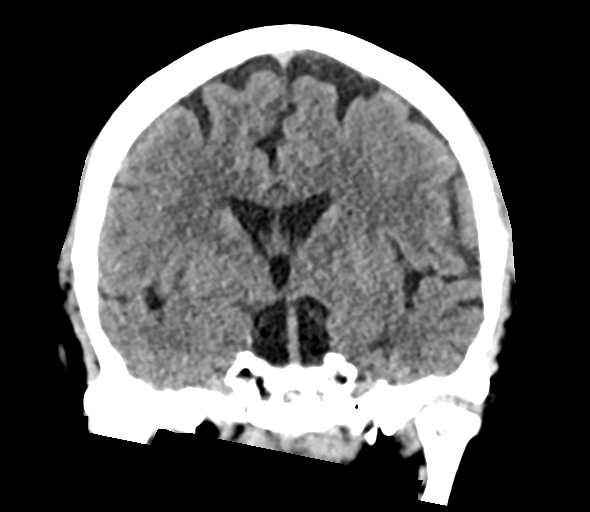
[im 36/65  brain]
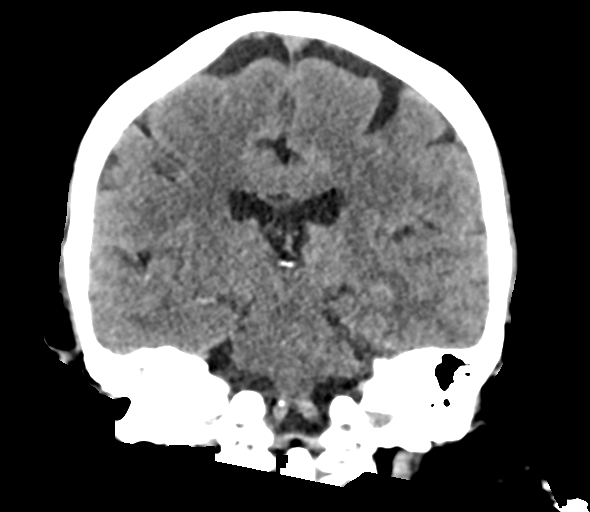

[Series 5: sag soft · sagittal · 0.29mm/px · 3 of 48 slices shown]
[im 19/48  brain]
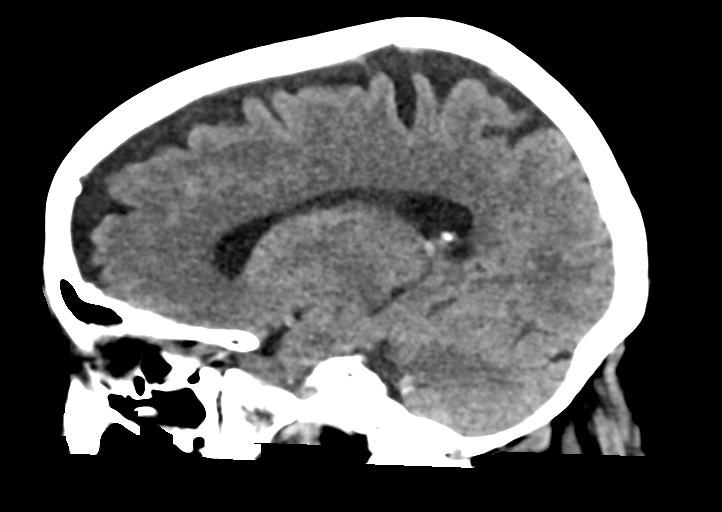
[im 24/48  brain]
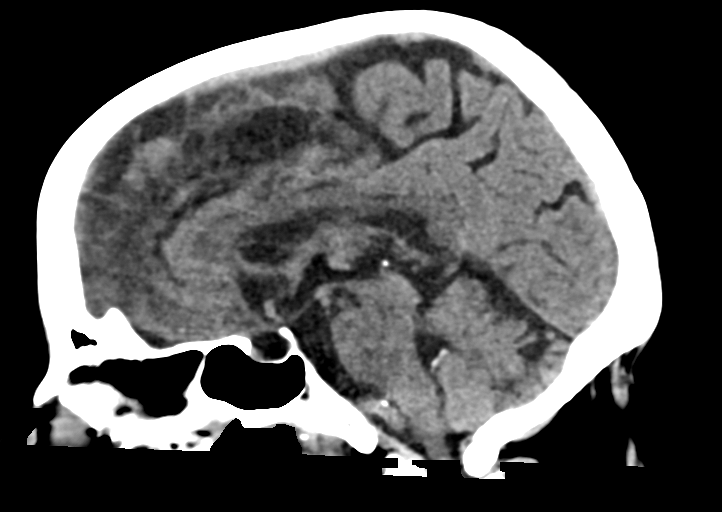
[im 30/48  brain]
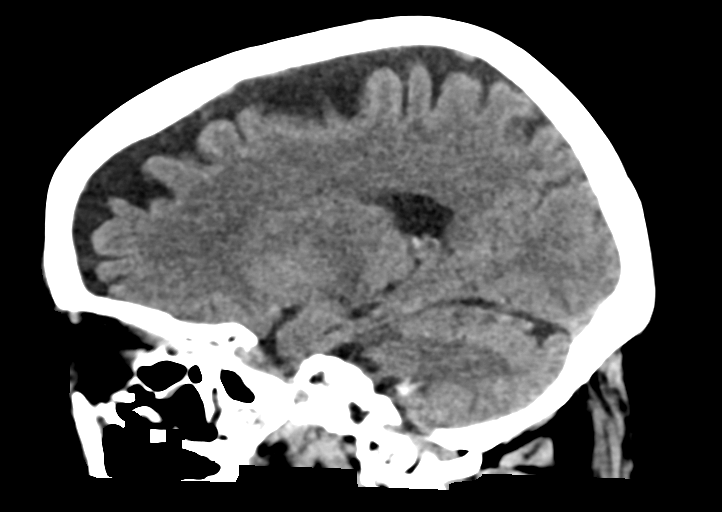

[Series 6: axial · axial · 0.34mm/px · z∈[-135,-42]mm · 5 of 29 slices shown, 7 images]
[im 5/29  brain]
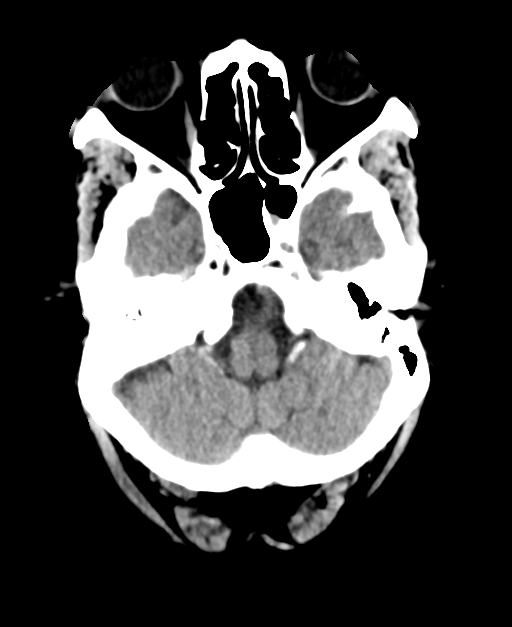
[im 5/29  bone]
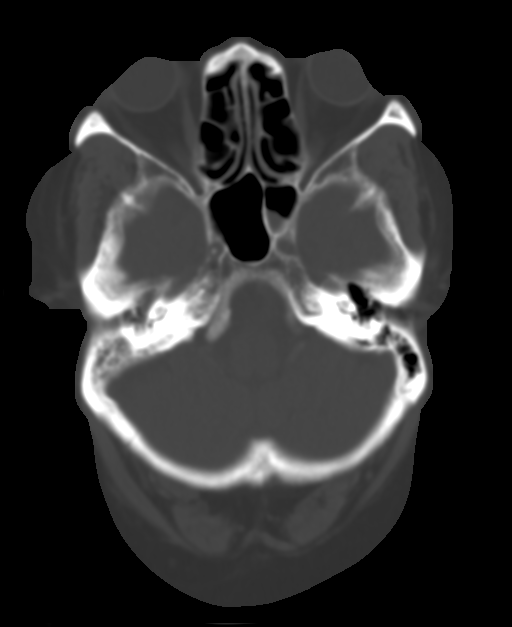
[im 10/29  brain]
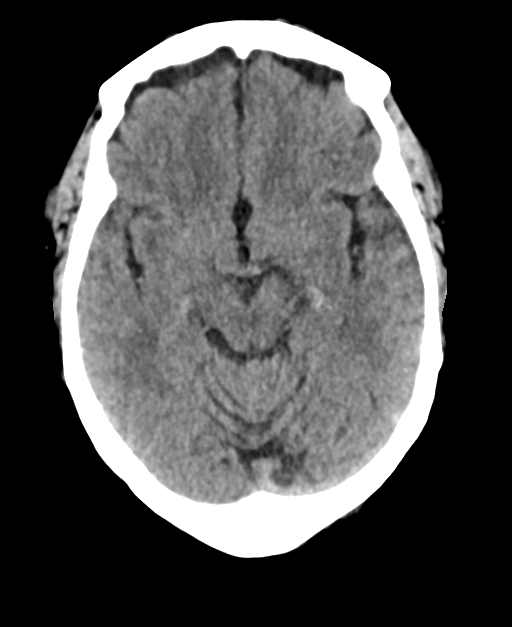
[im 15/29  brain]
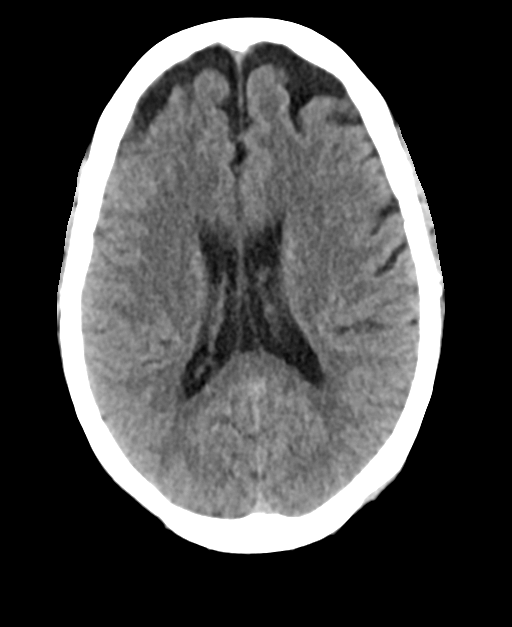
[im 19/29  brain]
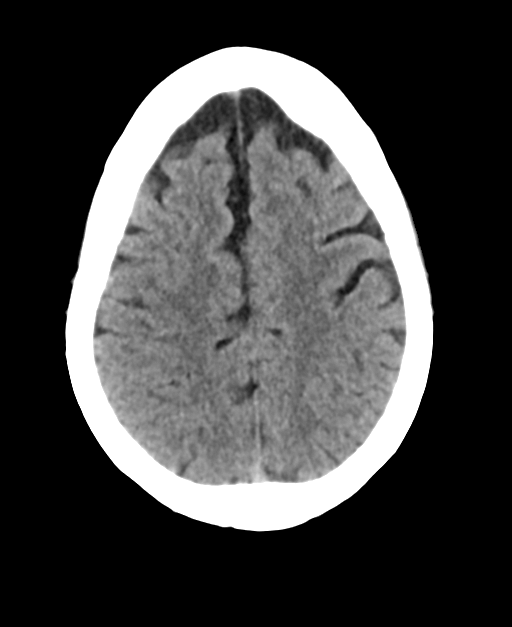
[im 24/29  brain]
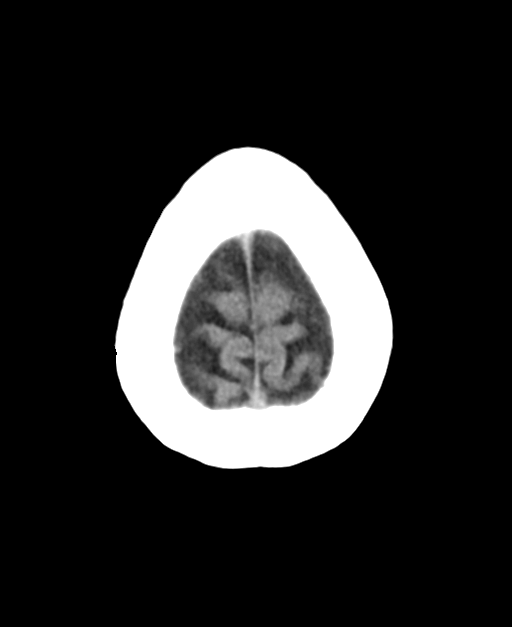
[im 24/29  bone]
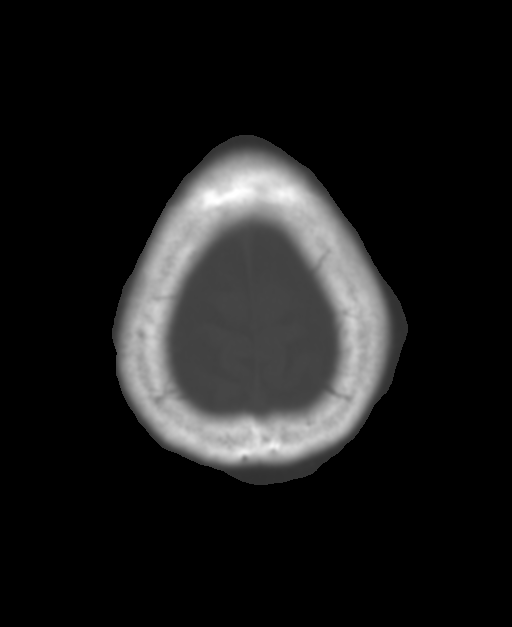

[14 of 47 positions shown; findings below may reference images not displayed]

FINDINGS: Brain: Mild cerebral atrophy. Patchy areas of mild decreased
attenuation are noted throughout the deep and periventricular white
matter of the cerebral hemispheres bilaterally, compatible with mild
chronic microvascular ischemic disease. No evidence of acute
infarction, hemorrhage, hydrocephalus, extra-axial collection or
mass lesion/mass effect.

Vascular: No hyperdense vessel or unexpected calcification.

Skull: Normal. Negative for fracture or focal lesion.

Sinuses/Orbits: No acute finding. Chronic right mastoid effusion,
unchanged.

Other: None.
IMPRESSION: 1. No acute intracranial abnormalities.
2. Mild cerebral atrophy with mild chronic microvascular ischemic
changes in the cerebral white matter, as above.

## 2023-12-29 IMAGING — CT CT HEAD CODE STROKE
2 of 3 series · 14 of 47 positions shown, 17 images · non-contrast
Comparison: Brain MRI and head CT 04/19/2021.

CLINICAL DATA: Code stroke.  80-year-old female



[Series 2: head 5.0 st · axial · 0.39mm/px · z∈[-71,+54]mm · 11 of 31 slices shown, 14 images]
[im 3/31  brain]
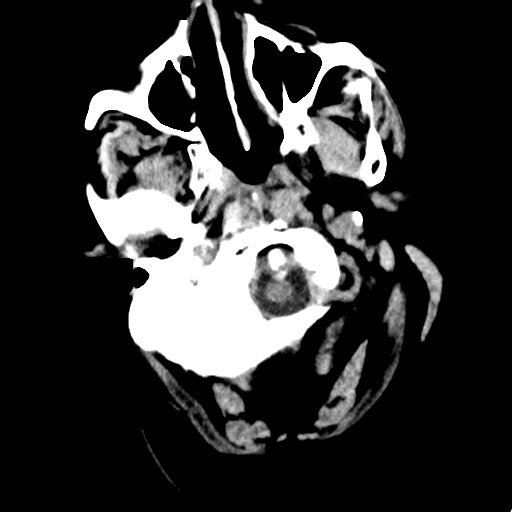
[im 3/31  bone]
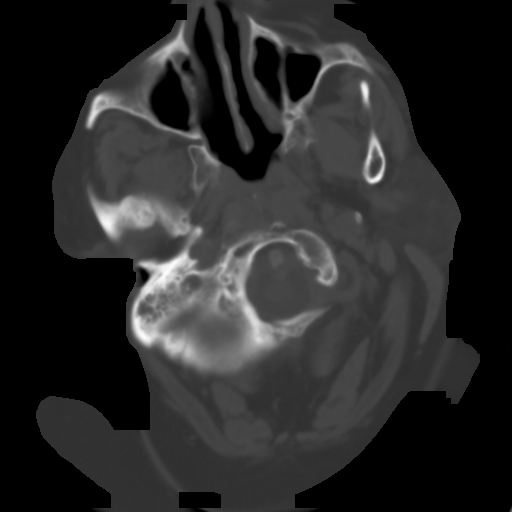
[im 5/31  brain]
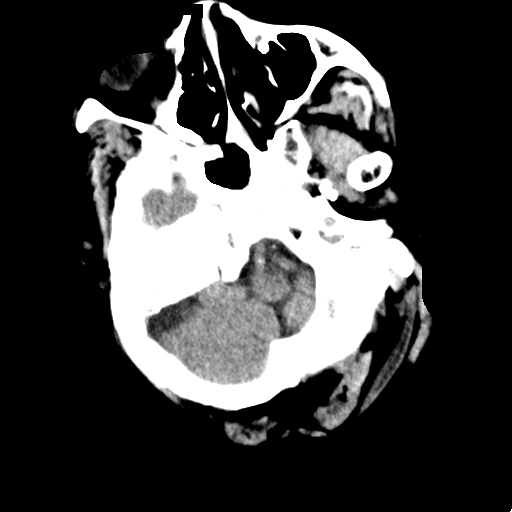
[im 8/31  brain]
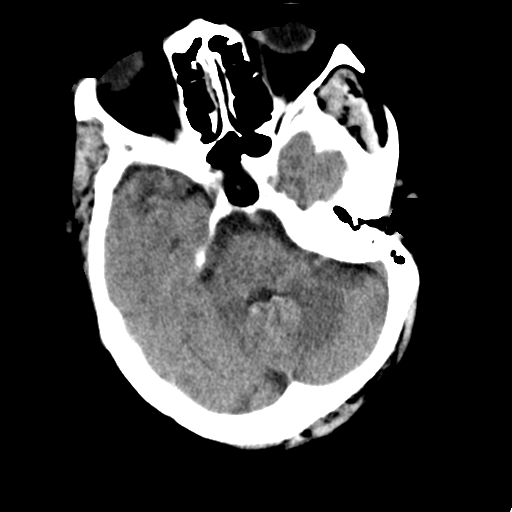
[im 10/31  brain]
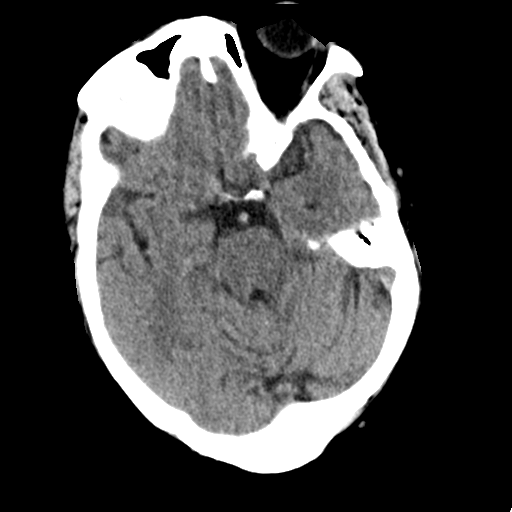
[im 13/31  brain]
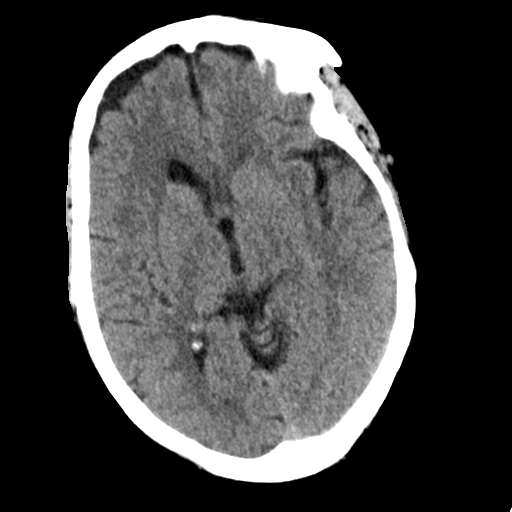
[im 13/31  bone]
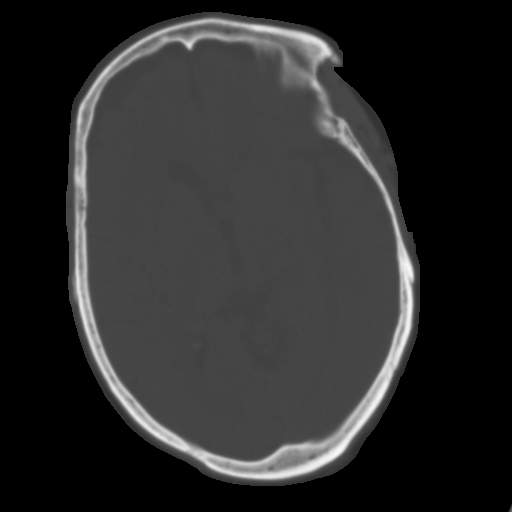
[im 16/31  brain]
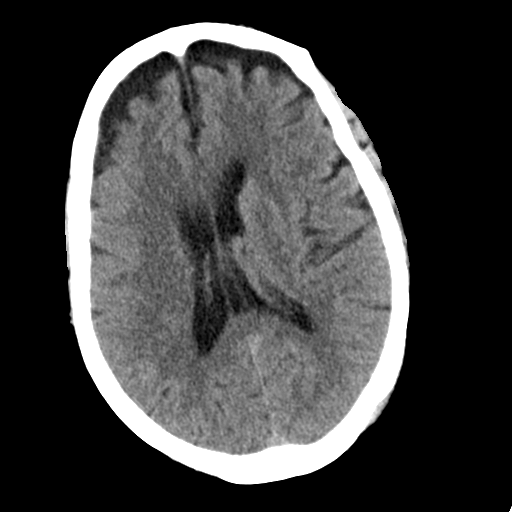
[im 18/31  brain]
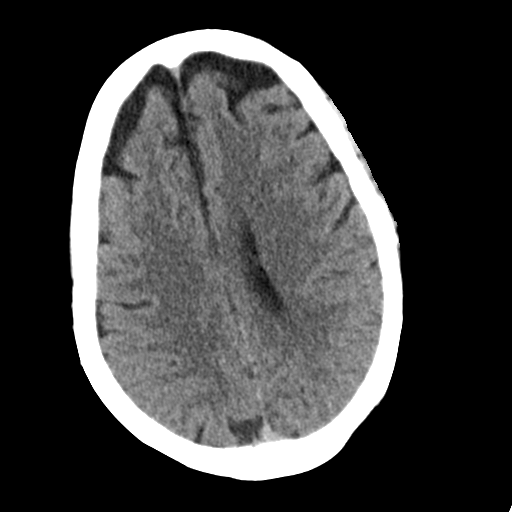
[im 21/31  brain]
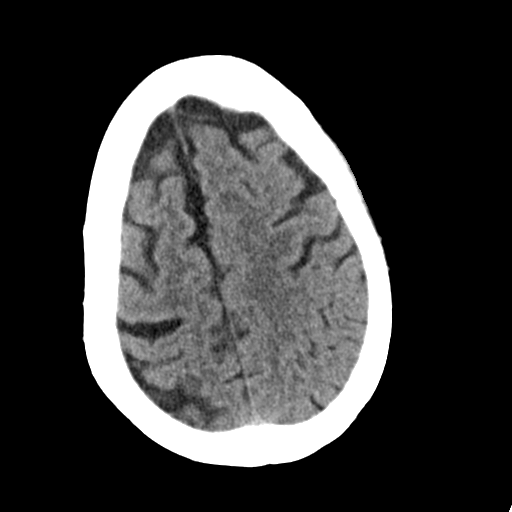
[im 23/31  brain]
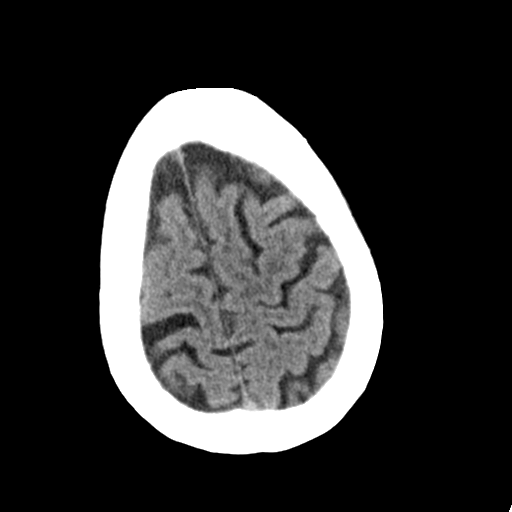
[im 23/31  bone]
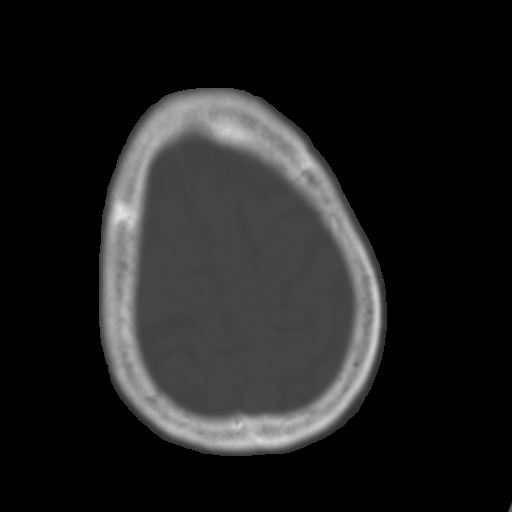
[im 26/31  brain]
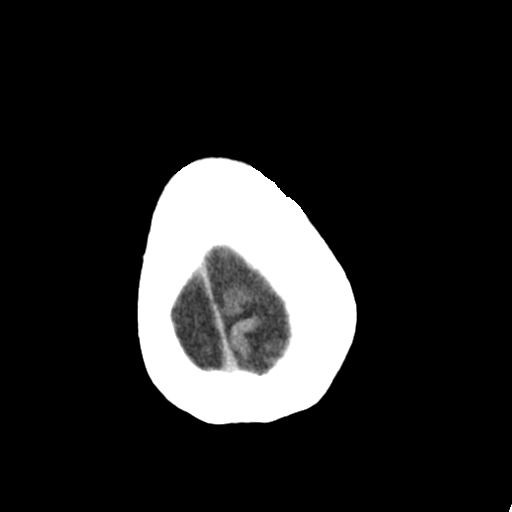
[im 28/31  brain]
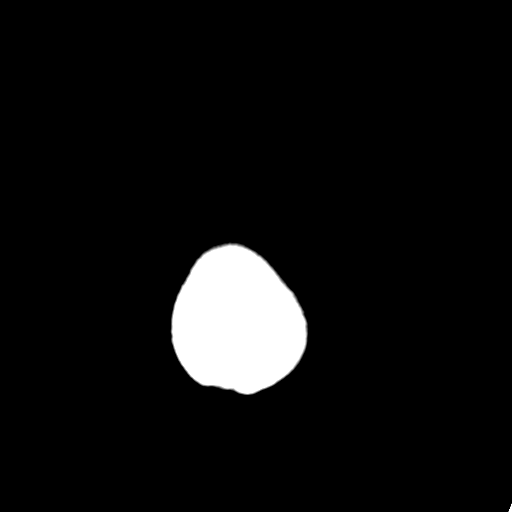

[Series 5: head 3.0 cor st · coronal · 0.30mm/px · 3 of 67 slices shown]
[im 23/67  brain]
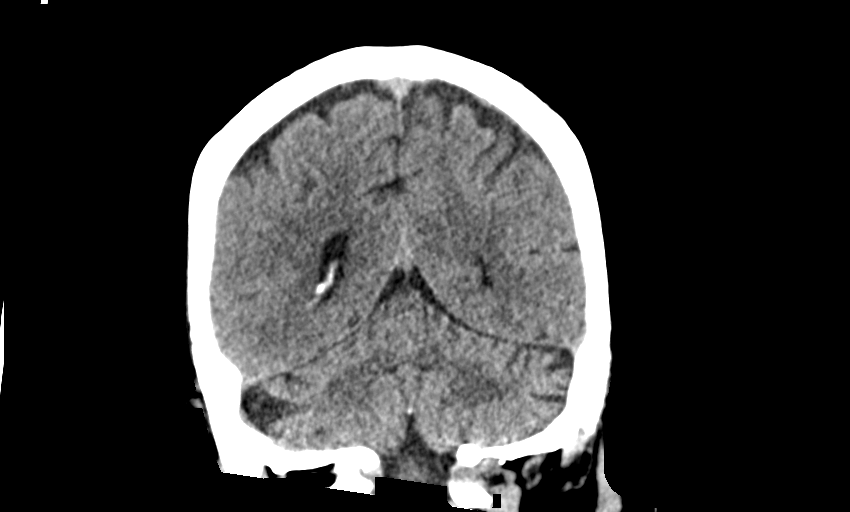
[im 30/67  brain]
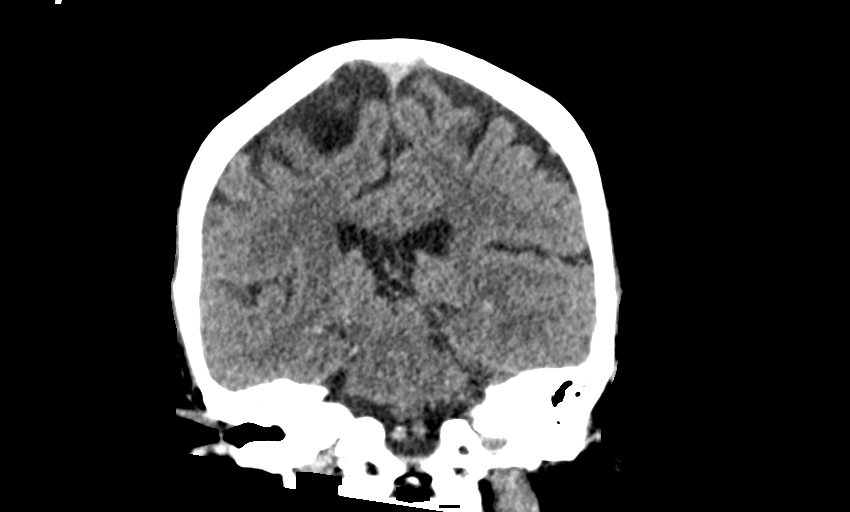
[im 37/67  brain]
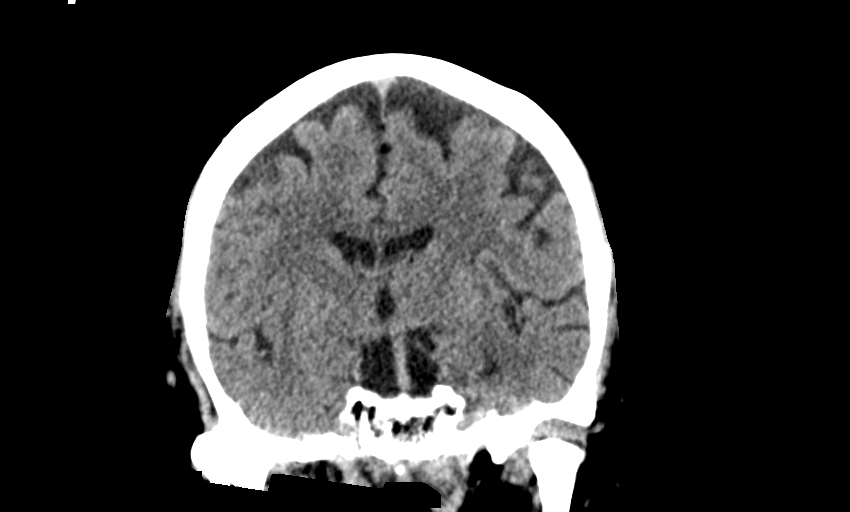

[14 of 47 positions shown; findings below may reference images not displayed]

FINDINGS: Brain: Unchanged cerebral volume. No midline shift,
ventriculomegaly, mass effect, evidence of mass lesion, intracranial
hemorrhage or evidence of cortically based acute infarction. Stable
gray-white matter differentiation throughout the brain. Chronic mild
heterogeneity in the right caudate nucleus.

Vascular: Calcified atherosclerosis at the skull base. No suspicious
intracranial vascular hyperdensity.

Skull: No acute osseous abnormality identified.

Sinuses/Orbits: Ongoing right middle ear and mastoid opacification.
Mild left mastoid effusion not significantly changed. Fluid level in
the left sphenoid sinus not significantly changed. Maxillary
mucoperiosteal thickening is stable.

Other: Disconjugate gaze similar [REDACTED]. No acute orbit or scalp
soft tissue finding.

ASPECTS ([place]berta Stroke Program Early CT Score)

Total score (0-10 with 10 being normal): 10
IMPRESSION: 1. Stable CT appearance of the brain with no acute cortically based
infarct or acute intracranial hemorrhage identified. ASPECTS 10.
2. #1 was communicated to Dr. Yong at [DATE] on 06/18/2021 by
text page via the [employer] messaging system.
3. Ongoing chronic right middle ear, mastoid, and some paranasal
sinus inflammation.

## 2023-12-29 IMAGING — DX DG CHEST 1V PORT
1 series · 1 of 1 positions shown · non-contrast
Comparison: 04/19/2021 and prior radiograph

CLINICAL DATA: Altered mental status

EXAM:
PORTABLE CHEST 1 VIEW

[chest ap]
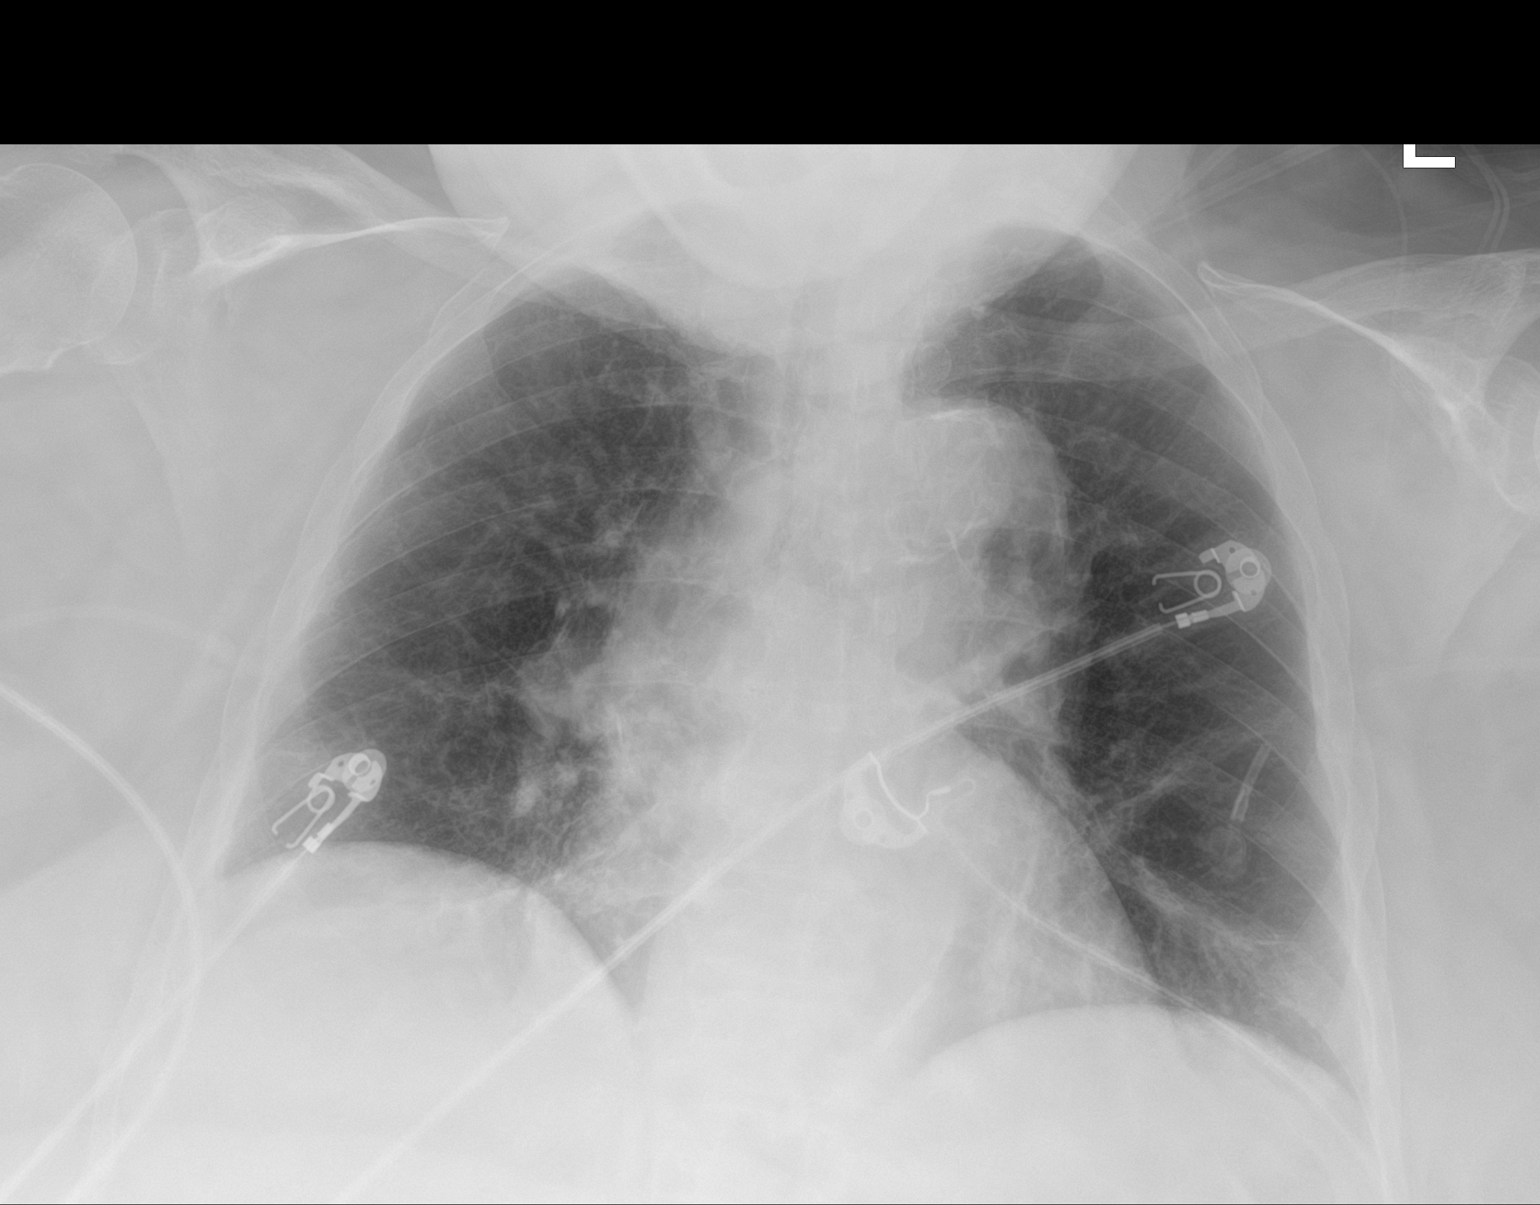

[1 of 1 positions shown; findings below may reference images not displayed]

FINDINGS: This is a low volume study.

Mild basilar atelectasis noted.

Cardiomediastinal silhouette is unchanged.

There is no evidence of focal airspace disease, pulmonary edema,
suspicious pulmonary nodule/mass, pleural effusion, or pneumothorax.

No acute bony abnormalities are identified.
IMPRESSION: Low volume study with mild basilar atelectasis.
